# Patient Record
Sex: Female | Born: 1935 | Race: White | Hispanic: No | Marital: Married | Smoking: Never smoker
Health system: Southern US, Community
[De-identification: ages and names within clinical notes are randomized; demographics above are authoritative.]

## PROBLEM LIST (undated history)

## (undated) DIAGNOSIS — R8290 Unspecified abnormal findings in urine: Secondary | ICD-10-CM

## (undated) DIAGNOSIS — M545 Low back pain, unspecified: Secondary | ICD-10-CM

## (undated) DIAGNOSIS — J383 Other diseases of vocal cords: Secondary | ICD-10-CM

## (undated) DIAGNOSIS — M419 Scoliosis, unspecified: Secondary | ICD-10-CM

## (undated) DIAGNOSIS — R1013 Epigastric pain: Secondary | ICD-10-CM

## (undated) DIAGNOSIS — K5792 Diverticulitis of intestine, part unspecified, without perforation or abscess without bleeding: Secondary | ICD-10-CM

## (undated) DIAGNOSIS — E785 Hyperlipidemia, unspecified: Secondary | ICD-10-CM

## (undated) DIAGNOSIS — F5104 Psychophysiologic insomnia: Secondary | ICD-10-CM

## (undated) DIAGNOSIS — J101 Influenza due to other identified influenza virus with other respiratory manifestations: Secondary | ICD-10-CM

## (undated) DIAGNOSIS — F329 Major depressive disorder, single episode, unspecified: Secondary | ICD-10-CM

## (undated) DIAGNOSIS — M810 Age-related osteoporosis without current pathological fracture: Secondary | ICD-10-CM

## (undated) DIAGNOSIS — I639 Cerebral infarction, unspecified: Secondary | ICD-10-CM

## (undated) DIAGNOSIS — N183 Chronic kidney disease, stage 3 unspecified: Secondary | ICD-10-CM

## (undated) DIAGNOSIS — F331 Major depressive disorder, recurrent, moderate: Secondary | ICD-10-CM

## (undated) DIAGNOSIS — R740 Nonspecific elevation of levels of transaminase and lactic acid dehydrogenase [LDH]: Secondary | ICD-10-CM

## (undated) DIAGNOSIS — J9601 Acute respiratory failure with hypoxia: Secondary | ICD-10-CM

## (undated) DIAGNOSIS — D649 Anemia, unspecified: Secondary | ICD-10-CM

## (undated) DIAGNOSIS — G4700 Insomnia, unspecified: Secondary | ICD-10-CM

## (undated) DIAGNOSIS — M316 Other giant cell arteritis: Secondary | ICD-10-CM

## (undated) DIAGNOSIS — R238 Other skin changes: Secondary | ICD-10-CM

## (undated) DIAGNOSIS — A419 Sepsis, unspecified organism: Secondary | ICD-10-CM

## (undated) DIAGNOSIS — R32 Unspecified urinary incontinence: Secondary | ICD-10-CM

## (undated) DIAGNOSIS — Z8619 Personal history of other infectious and parasitic diseases: Secondary | ICD-10-CM

## (undated) DIAGNOSIS — F32A Depression, unspecified: Secondary | ICD-10-CM

## (undated) DIAGNOSIS — I2510 Atherosclerotic heart disease of native coronary artery without angina pectoris: Secondary | ICD-10-CM

## (undated) DIAGNOSIS — M069 Rheumatoid arthritis, unspecified: Secondary | ICD-10-CM

## (undated) DIAGNOSIS — IMO0002 Reserved for concepts with insufficient information to code with codable children: Secondary | ICD-10-CM

## (undated) DIAGNOSIS — K5790 Diverticulosis of intestine, part unspecified, without perforation or abscess without bleeding: Secondary | ICD-10-CM

## (undated) DIAGNOSIS — M48061 Spinal stenosis, lumbar region without neurogenic claudication: Secondary | ICD-10-CM

## (undated) DIAGNOSIS — M5127 Other intervertebral disc displacement, lumbosacral region: Secondary | ICD-10-CM

## (undated) DIAGNOSIS — G43909 Migraine, unspecified, not intractable, without status migrainosus: Secondary | ICD-10-CM

## (undated) DIAGNOSIS — I10 Essential (primary) hypertension: Secondary | ICD-10-CM

## (undated) DIAGNOSIS — R001 Bradycardia, unspecified: Secondary | ICD-10-CM

## (undated) DIAGNOSIS — K219 Gastro-esophageal reflux disease without esophagitis: Secondary | ICD-10-CM

## (undated) DIAGNOSIS — I480 Paroxysmal atrial fibrillation: Secondary | ICD-10-CM

## (undated) DIAGNOSIS — R109 Unspecified abdominal pain: Secondary | ICD-10-CM

## (undated) DIAGNOSIS — F411 Generalized anxiety disorder: Secondary | ICD-10-CM

## (undated) DIAGNOSIS — K649 Unspecified hemorrhoids: Secondary | ICD-10-CM

## (undated) DIAGNOSIS — G8929 Other chronic pain: Secondary | ICD-10-CM

## (undated) DIAGNOSIS — H269 Unspecified cataract: Secondary | ICD-10-CM

## (undated) DIAGNOSIS — T4145XA Adverse effect of unspecified anesthetic, initial encounter: Secondary | ICD-10-CM

## (undated) DIAGNOSIS — K529 Noninfective gastroenteritis and colitis, unspecified: Secondary | ICD-10-CM

## (undated) HISTORY — PX: OTHER SURGICAL HISTORY: SHX169

## (undated) HISTORY — DX: Cerebral infarction, unspecified: I63.9

## (undated) HISTORY — PX: X-STOP IMPLANTATION: SHX2677

## (undated) HISTORY — PX: BACK SURGERY: SHX140

## (undated) HISTORY — PX: EPIDURAL BLOCK INJECTION: SHX1516

## (undated) HISTORY — DX: Unspecified urinary incontinence: R32

## (undated) HISTORY — PX: BREAST BIOPSY: SHX20

## (undated) HISTORY — DX: Acute respiratory failure with hypoxia: J96.01

## (undated) HISTORY — PX: CATARACT EXTRACTION W/ INTRAOCULAR LENS IMPLANT: SHX1309

## (undated) HISTORY — DX: Anemia, unspecified: D64.9

## (undated) HISTORY — PX: TUBAL LIGATION: SHX77

## (undated) HISTORY — DX: Unspecified abnormal findings in urine: R82.90

## (undated) HISTORY — DX: Hyperlipidemia, unspecified: E78.5

## (undated) HISTORY — DX: Unspecified hemorrhoids: K64.9

## (undated) HISTORY — DX: Atherosclerotic heart disease of native coronary artery without angina pectoris: I25.10

## (undated) HISTORY — PX: TONSILLECTOMY AND ADENOIDECTOMY: SUR1326

## (undated) HISTORY — DX: Epigastric pain: R10.13

## (undated) HISTORY — DX: Nonspecific elevation of levels of transaminase and lactic acid dehydrogenase (ldh): R74.0

## (undated) HISTORY — DX: Other giant cell arteritis: M31.6

## (undated) HISTORY — DX: Diverticulosis of intestine, part unspecified, without perforation or abscess without bleeding: K57.90

## (undated) HISTORY — DX: Unspecified abdominal pain: R10.9

## (undated) HISTORY — DX: Psychophysiologic insomnia: F51.04

## (undated) HISTORY — DX: Gastro-esophageal reflux disease without esophagitis: K21.9

## (undated) HISTORY — DX: Major depressive disorder, recurrent, moderate: F33.1

## (undated) HISTORY — DX: Influenza due to other identified influenza virus with other respiratory manifestations: J10.1

## (undated) HISTORY — DX: Essential (primary) hypertension: I10

## (undated) HISTORY — PX: ABDOMINAL HYSTERECTOMY: SHX81

## (undated) HISTORY — DX: Other intervertebral disc displacement, lumbosacral region: M51.27

## (undated) HISTORY — DX: Spinal stenosis, lumbar region without neurogenic claudication: M48.061

## (undated) HISTORY — DX: Noninfective gastroenteritis and colitis, unspecified: K52.9

## (undated) HISTORY — DX: Sepsis, unspecified organism: A41.9

---

## 1999-03-31 HISTORY — PX: LAPAROSCOPIC CHOLECYSTECTOMY: SUR755

## 1999-03-31 HISTORY — PX: BLADDER SUSPENSION: SHX72

## 2000-04-02 LAB — HM PAP SMEAR

## 2004-03-30 HISTORY — PX: CORONARY ANGIOPLASTY WITH STENT PLACEMENT: SHX49

## 2007-04-03 LAB — HM COLONOSCOPY

## 2009-03-30 DIAGNOSIS — M316 Other giant cell arteritis: Secondary | ICD-10-CM

## 2009-03-30 DIAGNOSIS — I480 Paroxysmal atrial fibrillation: Secondary | ICD-10-CM

## 2009-03-30 HISTORY — PX: TEMPORAL ARTERY BIOPSY / LIGATION: SUR132

## 2009-03-30 HISTORY — DX: Other giant cell arteritis: M31.6

## 2009-03-30 HISTORY — DX: Paroxysmal atrial fibrillation: I48.0

## 2009-04-02 LAB — HM MAMMOGRAPHY

## 2010-03-30 HISTORY — PX: HEMIARTHROPLASTY HIP: SUR652

## 2010-09-01 ENCOUNTER — Encounter: Payer: Self-pay | Admitting: Family Medicine

## 2010-09-01 ENCOUNTER — Ambulatory Visit (INDEPENDENT_AMBULATORY_CARE_PROVIDER_SITE_OTHER): Payer: Medicare Other | Admitting: Family Medicine

## 2010-09-01 VITALS — BP 150/80 | HR 72 | Temp 98.3°F | Resp 12 | Ht 64.75 in | Wt 152.0 lb

## 2010-09-01 DIAGNOSIS — I2510 Atherosclerotic heart disease of native coronary artery without angina pectoris: Secondary | ICD-10-CM

## 2010-09-01 DIAGNOSIS — G4700 Insomnia, unspecified: Secondary | ICD-10-CM

## 2010-09-01 DIAGNOSIS — M316 Other giant cell arteritis: Secondary | ICD-10-CM | POA: Insufficient documentation

## 2010-09-01 DIAGNOSIS — I10 Essential (primary) hypertension: Secondary | ICD-10-CM | POA: Insufficient documentation

## 2010-09-01 DIAGNOSIS — K219 Gastro-esophageal reflux disease without esophagitis: Secondary | ICD-10-CM

## 2010-09-01 DIAGNOSIS — E785 Hyperlipidemia, unspecified: Secondary | ICD-10-CM

## 2010-09-01 DIAGNOSIS — Z8679 Personal history of other diseases of the circulatory system: Secondary | ICD-10-CM

## 2010-09-01 HISTORY — DX: Gastro-esophageal reflux disease without esophagitis: K21.9

## 2010-09-01 NOTE — Progress Notes (Signed)
  Subjective:    Patient ID: Betty Jackson, female    DOB: April 11, 1934, 76 y.o.   MRN: [id number]  HPI Patient is new to establish care. Past medical history is reviewed. She has temporal arteritis diagnosed March 2011. Currently followed at [facility] Recent reduction prednisone 10 mg daily. Had recent sedimentation rate and C-reactive protein and needs these monthly. Plan is to continue slow taper of prednisone. Other medical problems include history of CAD with reported heart stent 2006. Questionable right coronary artery. She's had no similar problems since then. She has not established with cardiologist here yet.  Other problems include history of hypertension, hyperlipidemia, transient atrial fibrillation. She has not had any history of diabetes but monitors blood sugars because of prednisone use. Blood sugars consistently in the 90s.  History of cholecystectomy, tonsillectomy, and total hysterectomy age 72. This was secondary to menorrhagia.  Daughter with breast cancer. Premature heart disease in parents as well as hypertension history  Patient is married. No history of smoking. Occasional alcohol use.   Review of Systems  Constitutional: Negative for fever, activity change, appetite change and unexpected weight change.  Respiratory: Negative for cough, shortness of breath and wheezing.   Cardiovascular: Negative for chest pain, palpitations and leg swelling.  Gastrointestinal: Negative for abdominal pain.  Genitourinary: Negative for dysuria.  Skin: Negative for rash.  Neurological: Negative for dizziness, seizures, syncope, weakness and headaches.  Hematological: Negative for adenopathy. Does not bruise/bleed easily.  Psychiatric/Behavioral: Negative for dysphoric mood.       Objective:   Physical Exam  Constitutional: She is oriented to person, place, and time. She appears well-developed and well-nourished.  HENT:  Head: Normocephalic and atraumatic.  Right Ear: External  ear normal.  Left Ear: External ear normal.  Neck: Neck supple. No thyromegaly present.  Cardiovascular: Normal rate, regular rhythm and normal heart sounds.   No murmur heard. Pulmonary/Chest: Effort normal and breath sounds normal. No respiratory distress. She has no wheezes. She has no rales.  Musculoskeletal: Normal range of motion. She exhibits no edema.  Lymphadenopathy:    She has no cervical adenopathy.  Neurological: She is alert and oriented to person, place, and time. No cranial nerve deficit.  Skin: No rash noted.  Psychiatric: She has a normal mood and affect. Her behavior is normal.          Assessment & Plan:  #1 history of temporal arteritis. Followed at [facility]. Currently prednisone 10 mg daily. She'll present here monthly for sedimentation rate and C-reactive protein #2 hypertension- stable by home readings #3 hyperlipidemia #4 history of CAD with questionable stent right coronary artery #5 history of reported transient atrial fibrillation  Obtain old records. Schedule followup in 2 months. Labs in one month

## 2010-09-17 ENCOUNTER — Encounter: Payer: Self-pay | Admitting: Family Medicine

## 2010-09-17 ENCOUNTER — Ambulatory Visit (INDEPENDENT_AMBULATORY_CARE_PROVIDER_SITE_OTHER): Payer: Medicare Other | Admitting: Family Medicine

## 2010-09-17 VITALS — BP 154/86 | HR 80 | Temp 98.6°F | Wt 146.0 lb

## 2010-09-17 DIAGNOSIS — I10 Essential (primary) hypertension: Secondary | ICD-10-CM

## 2010-09-17 DIAGNOSIS — R42 Dizziness and giddiness: Secondary | ICD-10-CM

## 2010-09-17 MED ORDER — METOPROLOL TARTRATE 50 MG PO TABS: 50.0000 mg | ORAL_TABLET | Freq: Two times a day (BID) | ORAL | Status: DC

## 2010-09-17 NOTE — Progress Notes (Signed)
  Subjective:    Patient ID: Betty Jackson, female    DOB: 1935/03/28, 76 y.o.   MRN: [id number]  HPI Here with her daughter for several days of elevated BPs. This past weekend she has several days of mild HAs, dizziness, and generalized fatigue. Her BP jumped up to 180s systolic. No chest pains or SOB. Now today the BP is back down and she feels better. She cannot think of a reason to explain why her BP went up. No recent medication changes, although her temporal arteritis specialist at [facility] recently decreased her daily dose of prednisone from 15 mg to 10 mg. No swelling in the hands or feet.    Review of Systems  Constitutional: Positive for fatigue.  HENT: Negative.   Respiratory: Negative.   Cardiovascular: Negative.   Gastrointestinal: Negative.        Objective:   Physical Exam  Constitutional: She is oriented to person, place, and time. She appears well-developed and well-nourished.  HENT:  Head: Normocephalic and atraumatic.  Eyes: Conjunctivae are normal. Pupils are equal, round, and reactive to light.  Neck: Normal range of motion. Neck supple. No thyromegaly present.  Cardiovascular: Normal rate, regular rhythm, normal heart sounds and intact distal pulses.  Exam reveals no gallop and no friction rub.   No murmur heard. Pulmonary/Chest: Effort normal and breath sounds normal. No respiratory distress. She has no wheezes. She has no rales. She exhibits no tenderness.  Lymphadenopathy:    She has no cervical adenopathy.  Neurological: She is alert and oriented to person, place, and time. She exhibits normal muscle tone. Coordination normal.          Assessment & Plan:  I think her recent symptoms of fatigue and dizziness were related to the increased BP, and now that her BP is back down she feels better. I am not sure why this recent increase occurred, but we want to make sure it doesn't happen again. Increase the Metoprolol to 50 mg bid. See Dr. Caryl Never again in 2-3 weeks

## 2010-09-25 ENCOUNTER — Other Ambulatory Visit (INDEPENDENT_AMBULATORY_CARE_PROVIDER_SITE_OTHER): Payer: Medicare Other

## 2010-09-25 ENCOUNTER — Telehealth: Payer: Self-pay | Admitting: Family Medicine

## 2010-09-25 DIAGNOSIS — M316 Other giant cell arteritis: Secondary | ICD-10-CM

## 2010-09-25 NOTE — Telephone Encounter (Signed)
PT needs this faxed to Dr. Sigurd Sos at [facility]. Fax number is [phone].

## 2010-09-25 NOTE — Telephone Encounter (Signed)
Patient would like current copy of blood work sent over to Dr. Quita Skye office.  She needs to have her prednisone refilled but Dr. Clovis Riley needs to view blood results first.  Thank you

## 2010-09-25 NOTE — Telephone Encounter (Signed)
Labs faxed electronically to Shanon Payor MD

## 2010-09-26 NOTE — Progress Notes (Signed)
Quick Note:  Labs faxed to Sigurd Sos, MD at [facility] at [phone] ______

## 2010-09-26 NOTE — Progress Notes (Signed)
Quick Note:  Pt informed also ______

## 2010-09-26 NOTE — Progress Notes (Signed)
Quick Note:  Pt informed, labs faxed to Sigurd Sos [phone] ______

## 2010-10-06 ENCOUNTER — Encounter: Payer: Self-pay | Admitting: Family Medicine

## 2010-10-06 ENCOUNTER — Ambulatory Visit (INDEPENDENT_AMBULATORY_CARE_PROVIDER_SITE_OTHER): Payer: Medicare Other | Admitting: Family Medicine

## 2010-10-06 DIAGNOSIS — I10 Essential (primary) hypertension: Secondary | ICD-10-CM

## 2010-10-06 DIAGNOSIS — M316 Other giant cell arteritis: Secondary | ICD-10-CM

## 2010-10-06 MED ORDER — AMLODIPINE BESYLATE 5 MG PO TABS: 5.0000 mg | ORAL_TABLET | Freq: Every day | ORAL | Status: DC

## 2010-10-06 NOTE — Progress Notes (Signed)
  Subjective:    Patient ID: Betty Jackson, female    DOB: 01/05/1935, 76 y.o.   MRN: [id number]  HPI Followup hypertension. Refer to last note. She had elevated blood pressure and Lopressor increased to 50 mg twice daily. She does have history of CAD. She also takes losartan HCTZ 50/12.5 one daily. She's had several recent blood pressures in the 160 systolic range. This is down from high of 200 systolic prior to increasing the Lopressor. She's had some fatigue and headache issues which are slightly better.  History temporal arteritis. Followed at [facility]. Recent reduction in prednisone 9 mg daily. Repeat sedimentation rate 2 in one month.  Past Medical History  Diagnosis Date  . Arthritis   . Headache   . Hypertension   . Hyperlipidemia   . Heart disease   . Incontinence of urine   . GERD (gastroesophageal reflux disease)   . Temporal arteritis 2011   Past Surgical History  Procedure Date  . Cholecystectomy   . Tonsillectomy and adenoidectomy   . Carotid stent 2006  . Abdominal hysterectomy age 59    TAH    reports that she has never smoked. She does not have any smokeless tobacco history on file. Her alcohol and drug histories not on file. family history includes Cancer in her daughter; Heart disease in her other; and Hypertension in her other.  There is no history of Arthritis. No Known Allergies    Review of Systems  Constitutional: Negative for fatigue.  Eyes: Negative for visual disturbance.  Respiratory: Negative for cough, chest tightness, shortness of breath and wheezing.   Cardiovascular: Negative for chest pain, palpitations and leg swelling.  Neurological: Negative for dizziness, seizures, syncope, weakness, light-headedness and headaches.       Objective:   Physical Exam  Constitutional: She appears well-developed and well-nourished.  HENT:  Mouth/Throat: Oropharynx is clear and moist.  Neck: Normal range of motion. Neck supple. No thyromegaly present.    Cardiovascular: Normal rate and regular rhythm.   Pulmonary/Chest: Effort normal and breath sounds normal. No respiratory distress. She has no wheezes. She has no rales.  Musculoskeletal: She exhibits no edema.  Lymphadenopathy:    She has no cervical adenopathy.          Assessment & Plan:  Hypertension. Suboptimally controlled. Add amlodipine 5 mg daily to her current regimen and reassess one month. Reviewed possible side effects.

## 2010-10-21 ENCOUNTER — Other Ambulatory Visit: Payer: Self-pay | Admitting: Psychiatry

## 2010-10-21 ENCOUNTER — Other Ambulatory Visit: Payer: Self-pay | Admitting: *Deleted

## 2010-10-21 DIAGNOSIS — M316 Other giant cell arteritis: Secondary | ICD-10-CM

## 2010-10-21 DIAGNOSIS — I6350 Cerebral infarction due to unspecified occlusion or stenosis of unspecified cerebral artery: Secondary | ICD-10-CM

## 2010-10-23 ENCOUNTER — Telehealth: Payer: Self-pay | Admitting: Family Medicine

## 2010-10-23 MED ORDER — TEMAZEPAM 30 MG PO CAPS: 30.0000 mg | ORAL_CAPSULE | Freq: Every evening | ORAL | Status: DC | PRN

## 2010-10-23 NOTE — Telephone Encounter (Signed)
Rx called in 

## 2010-10-23 NOTE — Telephone Encounter (Signed)
Pt is requesting a new rx for Temazepam 30 1qhs. Please call [facility] --[phone].

## 2010-11-03 ENCOUNTER — Ambulatory Visit
Admission: RE | Admit: 2010-11-03 | Discharge: 2010-11-03 | Disposition: A | Discharge status: Home / Self Care | Payer: Medicare Other | Source: Ambulatory Visit | Attending: *Deleted | Admitting: *Deleted

## 2010-11-03 DIAGNOSIS — I6350 Cerebral infarction due to unspecified occlusion or stenosis of unspecified cerebral artery: Secondary | ICD-10-CM

## 2010-11-03 DIAGNOSIS — M316 Other giant cell arteritis: Secondary | ICD-10-CM

## 2010-11-04 ENCOUNTER — Ambulatory Visit: Payer: PRIVATE HEALTH INSURANCE | Admitting: Family Medicine

## 2010-11-05 ENCOUNTER — Encounter: Payer: Self-pay | Admitting: Family Medicine

## 2010-11-05 ENCOUNTER — Ambulatory Visit (INDEPENDENT_AMBULATORY_CARE_PROVIDER_SITE_OTHER): Payer: Medicare Other | Admitting: Family Medicine

## 2010-11-05 VITALS — BP 140/78 | Temp 97.8°F | Wt 148.0 lb

## 2010-11-05 DIAGNOSIS — M316 Other giant cell arteritis: Secondary | ICD-10-CM

## 2010-11-05 DIAGNOSIS — I10 Essential (primary) hypertension: Secondary | ICD-10-CM

## 2010-11-05 LAB — SEDIMENTATION RATE: Sed Rate: 15 mm/hr (ref 0–22)

## 2010-11-05 NOTE — Progress Notes (Signed)
  Subjective:    Patient ID: Betty Jackson, female    DOB: 1935/01/07, 76 y.o.   MRN: [id number]  HPI Followup hypertension. Add amlodipine 5 mg daily last visit. Feels much better overall. More energy. No headaches. Denies dizziness, edema, or headache. Blood pressure improved by home readings. He had been running around 160 systolic and occasionally higher. Compliant with all medications.  Temporal arteritis followed at [facility]. Currently prednisone 9 mg daily. Needs repeat sedimentation rate and CRP today.   Review of Systems  Constitutional: Negative for fatigue.  Eyes: Negative for visual disturbance.  Respiratory: Negative for cough, chest tightness, shortness of breath and wheezing.   Cardiovascular: Negative for chest pain, palpitations and leg swelling.  Neurological: Negative for dizziness, seizures, syncope, weakness, light-headedness and headaches.       Objective:   Physical Exam  Constitutional: She appears well-developed and well-nourished.  HENT:  Mouth/Throat: Oropharynx is clear and moist.  Neck: Neck supple. No thyromegaly present.  Cardiovascular: Normal rate, regular rhythm and normal heart sounds.   Pulmonary/Chest: Effort normal and breath sounds normal. No respiratory distress. She has no wheezes. She has no rales.  Musculoskeletal: She exhibits no edema.          Assessment & Plan:  #1 hypertension improved continue current medications #2 temporal arteritis. Recheck sedimentation rate and CRP today and fax copies to [facility]

## 2010-11-06 NOTE — Progress Notes (Signed)
Quick Note:  Pt informed, labs faxed to Sigurd Sos, MD, [phone] ______

## 2010-11-25 ENCOUNTER — Other Ambulatory Visit: Payer: Self-pay | Admitting: Family Medicine

## 2010-11-25 MED ORDER — OMEPRAZOLE 20 MG PO CPDR: 20.0000 mg | DELAYED_RELEASE_CAPSULE | Freq: Every day | ORAL | Status: DC

## 2010-11-25 NOTE — Telephone Encounter (Signed)
done

## 2010-11-25 NOTE — Telephone Encounter (Signed)
Pt requesting refill on omeprazole (PRILOSEC) 10 MG capsule (pt said should be 20mg )  Send to [facility]

## 2010-12-03 ENCOUNTER — Other Ambulatory Visit (INDEPENDENT_AMBULATORY_CARE_PROVIDER_SITE_OTHER): Payer: Medicare Other

## 2010-12-03 DIAGNOSIS — M316 Other giant cell arteritis: Secondary | ICD-10-CM

## 2010-12-03 LAB — SEDIMENTATION RATE: Sed Rate: 10 mm/hr (ref 0–22)

## 2010-12-05 ENCOUNTER — Telehealth: Payer: Self-pay | Admitting: Family Medicine

## 2010-12-05 NOTE — Telephone Encounter (Signed)
Pt requesting results of  Labs be sent to DR [facility]. Please contact when this is complete

## 2010-12-08 NOTE — Telephone Encounter (Signed)
Labs faxed to Sigurd Sos MD

## 2010-12-09 NOTE — Progress Notes (Signed)
Quick Note:  Labs faxed to [place] ______

## 2011-01-02 ENCOUNTER — Other Ambulatory Visit: Payer: Medicare Other

## 2011-01-05 ENCOUNTER — Telehealth: Payer: Self-pay | Admitting: Family Medicine

## 2011-01-05 DIAGNOSIS — I4891 Unspecified atrial fibrillation: Secondary | ICD-10-CM

## 2011-01-05 NOTE — Telephone Encounter (Signed)
Please advise 

## 2011-01-05 NOTE — Telephone Encounter (Signed)
CRP and ESR per [facility].  Need to confirm duration of these tests.  We have been sending to [facility].

## 2011-01-05 NOTE — Telephone Encounter (Signed)
Message left on home VM requesting duration of these tests.  Will order when we hear back CRP and ESR

## 2011-01-05 NOTE — Telephone Encounter (Signed)
Pt said she is supposed to get lab work done every 28 days. Did not see an order in the system. What labs does she needs to be scheduled for?

## 2011-01-06 ENCOUNTER — Other Ambulatory Visit (INDEPENDENT_AMBULATORY_CARE_PROVIDER_SITE_OTHER): Payer: Medicare Other

## 2011-01-06 DIAGNOSIS — I4891 Unspecified atrial fibrillation: Secondary | ICD-10-CM

## 2011-01-06 DIAGNOSIS — Z23 Encounter for immunization: Secondary | ICD-10-CM

## 2011-01-06 LAB — SEDIMENTATION RATE: Sed Rate: 13 mm/hr (ref 0–22)

## 2011-01-06 NOTE — Telephone Encounter (Signed)
Pt called back, [facility] physician will need these labs for awhile yet.  Will schedule standing orders

## 2011-01-07 NOTE — Progress Notes (Signed)
Quick Note:  Pt informed, labs faxed to Sigurd Sos, MD at [place], [phone] ______

## 2011-01-22 ENCOUNTER — Telehealth: Payer: Self-pay | Admitting: Family Medicine

## 2011-01-23 ENCOUNTER — Telehealth: Payer: Self-pay | Admitting: *Deleted

## 2011-01-23 DIAGNOSIS — I2510 Atherosclerotic heart disease of native coronary artery without angina pectoris: Secondary | ICD-10-CM

## 2011-01-23 NOTE — Telephone Encounter (Signed)
Great.  Will set up.  Let her know we will make her referral.

## 2011-01-23 NOTE — Telephone Encounter (Signed)
VM from pt requesting a cardiology referral to Dr Clifton James.  That is who her husband sees

## 2011-01-23 NOTE — Telephone Encounter (Signed)
For what indication?

## 2011-01-23 NOTE — Telephone Encounter (Signed)
I called pt, she reports she had a Cardiologist in [place], [place], "I have a stent and they would check me out good annually".

## 2011-01-26 NOTE — Telephone Encounter (Signed)
Pt informed on home VM 

## 2011-02-03 ENCOUNTER — Other Ambulatory Visit (INDEPENDENT_AMBULATORY_CARE_PROVIDER_SITE_OTHER): Payer: Medicare Other

## 2011-02-03 DIAGNOSIS — I4891 Unspecified atrial fibrillation: Secondary | ICD-10-CM

## 2011-02-03 LAB — SEDIMENTATION RATE: Sed Rate: 12 mm/hr (ref 0–22)

## 2011-02-04 DIAGNOSIS — M069 Rheumatoid arthritis, unspecified: Secondary | ICD-10-CM | POA: Insufficient documentation

## 2011-02-04 LAB — C-REACTIVE PROTEIN: CRP: 0.24 mg/dL (ref <0.60)

## 2011-02-04 NOTE — Progress Notes (Signed)
Quick Note:  Pt informed ______ 

## 2011-02-04 NOTE — Progress Notes (Signed)
Quick Note:  Pt informed, labs faxed to Sigurd Sos, MD at [facility] ______

## 2011-02-18 ENCOUNTER — Ambulatory Visit (INDEPENDENT_AMBULATORY_CARE_PROVIDER_SITE_OTHER): Payer: Medicare Other | Admitting: Cardiovascular Disease

## 2011-02-18 ENCOUNTER — Encounter: Payer: Self-pay | Admitting: Cardiovascular Disease

## 2011-02-18 VITALS — BP 110/74 | HR 60 | Ht 65.0 in | Wt 147.0 lb

## 2011-02-18 DIAGNOSIS — M79604 Pain in right leg: Secondary | ICD-10-CM | POA: Insufficient documentation

## 2011-02-18 DIAGNOSIS — M79609 Pain in unspecified limb: Secondary | ICD-10-CM

## 2011-02-18 DIAGNOSIS — I2510 Atherosclerotic heart disease of native coronary artery without angina pectoris: Secondary | ICD-10-CM

## 2011-02-18 NOTE — Assessment & Plan Note (Signed)
Her right leg pain does not sound vascular however i was unable to find a right DP pulse with the hand held doppler. Her right PT and left DP/PT pulses are normal. Will get bilateral lower ext arterial dopplers to assess.

## 2011-02-18 NOTE — Progress Notes (Signed)
History of Present Illness:76 yo female with history of CAD s/p stent RCA, HTN, HLD, GERD, temporal arteritis here today to establish cardiac care. Her husband is my patient. She tells me that she had a stent placed in the RCA in [place], [place] in 2005. She has had occasional chest pains since then but none lately. She had repeat cardiac cath 3 years ago in [place] that showed patency of her stent and no other severe disease per the patient. She moved to [facility] for treatment of her temporal arteritis at [facility]. She has one isolated episode atrial fibrillation last year. No recurrence.   She tells me that she feels well. She has had no chest pain or SOB. Overall feels well. She does describe some pain in her right leg. This pain is across the anterior aspect of the right leg. This is at rest. It hurts to touch it. She was seen in orthopedics and told it may be from her knee. No swelling in her ankles or the right leg. No claudication.  Past Medical History  Diagnosis Date  . Arthritis   . Headache   . Hypertension   . Hyperlipidemia   . Incontinence of urine   . GERD (gastroesophageal reflux disease)   . Temporal arteritis 2011  . CAD (coronary artery disease)     Stent RCA in [facility]    Past Surgical History  Procedure Date  . Cholecystectomy   . Tonsillectomy and adenoidectomy   . Abdominal hysterectomy age 69    TAH  . Bladder suspension     Current Outpatient Prescriptions  Medication Sig Dispense Refill  . ALPRAZolam (XANAX) 0.5 MG tablet Take 0.5 mg by mouth at bedtime as needed.        Marland Kitchen amLODipine (NORVASC) 5 MG tablet Take 1 tablet (5 mg total) by mouth daily.  30 tablet  5  . aspirin 81 MG tablet Take 81 mg by mouth 2 (two) times daily.       Marland Kitchen atorvastatin (LIPITOR) 20 MG tablet Take 20 mg by mouth daily.        Marland Kitchen HYDROcodone-acetaminophen (NORCO) 10-325 MG per tablet Take 1 tablet by mouth every 6 (six) hours as needed.        Marland Kitchen losartan-hydrochlorothiazide (HYZAAR)  50-12.5 MG per tablet Take 1 tablet by mouth daily.        . metoprolol (LOPRESSOR) 50 MG tablet Take 1 tablet (50 mg total) by mouth 2 (two) times daily.  60 tablet  5  . omeprazole (PRILOSEC) 20 MG capsule Take 1 capsule (20 mg total) by mouth daily.  90 capsule  1  . Potassium Gluconate 550 MG TABS Take by mouth daily.        . predniSONE (DELTASONE) 1 MG tablet 4 tabs daily in the am along with a 5 mg tab       . predniSONE (DELTASONE) 5 MG tablet Take 5 mg by mouth daily.       . temazepam (RESTORIL) 30 MG capsule Take 1 capsule (30 mg total) by mouth at bedtime as needed.  30 capsule  1    No Known Allergies  History   Social History  . Marital Status: Married    Spouse Name: N/A    Number of Children: N/A  . Years of Education: N/A   Occupational History  . Not on file.   Social History Main Topics  . Smoking status: Never Smoker   . Smokeless tobacco: Not on file  .  Alcohol Use: Not on file  . Drug Use: Not on file  . Sexually Active: Not on file   Other Topics Concern  . Not on file   Social History Narrative  . No narrative on file    Family History  Problem Relation Age of Onset  . Cancer Daughter     breast  . Arthritis Neg Hx   . Heart disease Other   . Hypertension Other     Review of Systems:  As stated in the HPI and otherwise negative.   BP 110/74  Pulse 60  Ht 5\' 5"  (1.651 m)  Wt 147 lb (66.679 kg)  BMI 24.46 kg/m2  Physical Examination: General: Well developed, well nourished, NAD HEENT: OP clear, mucus membranes moist SKIN: warm, dry. No rashes. Neuro: No focal deficits Musculoskeletal: Muscle strength 5/5 all ext Psychiatric: Mood and affect normal Neck: No JVD, no carotid bruits, no thyromegaly, no lymphadenopathy. Lungs:Clear bilaterally, no wheezes, rhonci, crackles Cardiovascular: Regular rate and rhythm. No murmurs, gallops or rubs. Abdomen:Soft. Bowel sounds present. Non-tender.  Extremities: No lower extremity edema. Pulses  are 2 + in the left  DP/PT and the right PT. Could not find right DP with doppler.   YQM:VHQIO bradycardia, rate 50 bpm.

## 2011-02-18 NOTE — Assessment & Plan Note (Addendum)
Stable. She is on good therapy. No angina. No changes.

## 2011-02-18 NOTE — Patient Instructions (Signed)
Your physician wants you to follow-up in 12 months.  You will receive a reminder letter in the mail two months in advance. If you don't receive a letter, please call our office to schedule the follow-up appointment.  Your physician has requested that you have a lower  extremity arterial duplex. This test is an ultrasound of the arteries in the legs or arms. It looks at arterial blood flow in the legs and arms. Allow one hour for Lower and Upper Arterial scans. There are no restrictions or special instructions

## 2011-03-04 ENCOUNTER — Ambulatory Visit: Payer: Medicare Other

## 2011-03-04 DIAGNOSIS — Z0000 Encounter for general adult medical examination without abnormal findings: Secondary | ICD-10-CM

## 2011-03-04 DIAGNOSIS — M316 Other giant cell arteritis: Secondary | ICD-10-CM

## 2011-03-04 DIAGNOSIS — I7101 Dissection of thoracic aorta: Secondary | ICD-10-CM

## 2011-03-04 DIAGNOSIS — R799 Abnormal finding of blood chemistry, unspecified: Secondary | ICD-10-CM

## 2011-03-04 DIAGNOSIS — I4891 Unspecified atrial fibrillation: Secondary | ICD-10-CM

## 2011-03-04 DIAGNOSIS — M729 Fibroblastic disorder, unspecified: Secondary | ICD-10-CM

## 2011-03-04 DIAGNOSIS — I25119 Atherosclerotic heart disease of native coronary artery with unspecified angina pectoris: Secondary | ICD-10-CM

## 2011-03-04 DIAGNOSIS — I2510 Atherosclerotic heart disease of native coronary artery without angina pectoris: Secondary | ICD-10-CM

## 2011-03-04 LAB — SEDIMENTATION RATE: Sed Rate: 20 mm/hr (ref 0–22)

## 2011-03-06 ENCOUNTER — Encounter: Payer: Self-pay | Admitting: Family Medicine

## 2011-03-06 ENCOUNTER — Ambulatory Visit (INDEPENDENT_AMBULATORY_CARE_PROVIDER_SITE_OTHER): Payer: Medicare Other | Admitting: Family Medicine

## 2011-03-06 ENCOUNTER — Encounter: Payer: Medicare Other | Admitting: Cardiology

## 2011-03-06 DIAGNOSIS — I10 Essential (primary) hypertension: Secondary | ICD-10-CM

## 2011-03-06 DIAGNOSIS — E785 Hyperlipidemia, unspecified: Secondary | ICD-10-CM

## 2011-03-06 DIAGNOSIS — G4700 Insomnia, unspecified: Secondary | ICD-10-CM

## 2011-03-06 LAB — BASIC METABOLIC PANEL
BUN: 15 mg/dL (ref 6–23)
CO2: 31 mEq/L (ref 19–32)
Glucose, Bld: 75 mg/dL (ref 70–99)
Potassium: 3.6 mEq/L (ref 3.5–5.1)
Sodium: 139 mEq/L (ref 135–145)

## 2011-03-06 LAB — LIPID PANEL
HDL: 58 mg/dL (ref >39.00)
Total CHOL/HDL Ratio: 2
VLDL: 12.6 mg/dL (ref 0.0–40.0)

## 2011-03-06 LAB — HEPATIC FUNCTION PANEL
Bilirubin, Direct: 0.1 mg/dL (ref 0.0–0.3)
Total Bilirubin: 0.9 mg/dL (ref 0.3–1.2)

## 2011-03-06 NOTE — Progress Notes (Signed)
  Subjective:    Patient ID: Betty Jackson, female    DOB: 07/31/1934, 76 y.o.   MRN: [id number]  HPI  Medical followup. Patient has history of temporal arteritis, hypertension, hyperlipidemia, history of atrial fibrillation, CAD and some chronic intermittent insomnia. Recently saw cardiologist. Concern for possible circulation issues right lower extremity. Scheduled for arterial Doppler later today. Medications reviewed. Compliant with all. She has not had lipid panel since moving here from [place]. Takes Lipitor 20 mg daily. No consistent myalgias.  Blood pressure well controlled. No recent orthostasis. Denies any recent chest pains. Nonsmoker. Rare alcohol use.  Past Medical History  Diagnosis Date  . Arthritis   . Headache   . Hypertension   . Hyperlipidemia   . Incontinence of urine   . GERD (gastroesophageal reflux disease)   . Temporal arteritis 2011  . CAD (coronary artery disease)     Stent RCA in [facility]   Past Surgical History  Procedure Date  . Cholecystectomy   . Tonsillectomy and adenoidectomy   . Abdominal hysterectomy age 60    TAH  . Bladder suspension     reports that she has never smoked. She does not have any smokeless tobacco history on file. She reports that she drinks about one ounce of alcohol per week. She reports that she does not use illicit drugs. family history includes Cancer in her daughter and mother; Heart attack in her father; Heart disease in her other; and Hypertension in her other.  There is no history of Arthritis. No Known Allergies    Review of Systems  Constitutional: Negative for fatigue and unexpected weight change.  Eyes: Negative for visual disturbance.  Respiratory: Negative for cough, chest tightness, shortness of breath and wheezing.   Cardiovascular: Negative for chest pain, palpitations and leg swelling.  Gastrointestinal: Negative for abdominal pain.  Genitourinary: Negative for dysuria.  Skin: Negative for rash.    Neurological: Negative for dizziness, seizures, syncope, weakness, light-headedness and headaches.  Psychiatric/Behavioral: Negative for dysphoric mood.       Objective:   Physical Exam  Constitutional: She is oriented to person, place, and time. She appears well-developed and well-nourished.  Eyes: Pupils are equal, round, and reactive to light.  Neck: Neck supple. No thyromegaly present.  Cardiovascular: Normal rate, regular rhythm and normal heart sounds.   Pulmonary/Chest: Breath sounds normal. No respiratory distress. She has no wheezes. She has no rales.  Musculoskeletal: She exhibits no edema.  Neurological: She is alert and oriented to person, place, and time.          Assessment & Plan:  #1 hypertension stable continue current medications #2 hyperlipidemia with history of CAD. Check lipid and hepatic panel. #3 intermittent insomnia. Sleep hygiene discussed. Patient is cautioned not to use temazepam and alprazolam concomitantly

## 2011-03-09 ENCOUNTER — Encounter (INDEPENDENT_AMBULATORY_CARE_PROVIDER_SITE_OTHER): Payer: Medicare Other | Admitting: *Deleted

## 2011-03-09 DIAGNOSIS — I739 Peripheral vascular disease, unspecified: Secondary | ICD-10-CM

## 2011-03-09 DIAGNOSIS — I70219 Atherosclerosis of native arteries of extremities with intermittent claudication, unspecified extremity: Secondary | ICD-10-CM

## 2011-03-10 NOTE — Progress Notes (Signed)
Quick Note:  Pt informed, labs faxed to Sigurd Sos, [phone] ______

## 2011-03-10 NOTE — Progress Notes (Signed)
Quick Note:  Pt informed, labs faxed to [facility] ______

## 2011-03-19 ENCOUNTER — Encounter: Payer: Self-pay | Admitting: *Deleted

## 2011-04-01 ENCOUNTER — Telehealth: Payer: Self-pay | Admitting: Cardiovascular Disease

## 2011-04-01 NOTE — Telephone Encounter (Signed)
New msg Pt received a letter to call back about test results

## 2011-04-02 ENCOUNTER — Ambulatory Visit (INDEPENDENT_AMBULATORY_CARE_PROVIDER_SITE_OTHER): Payer: Medicare Other | Admitting: Family Medicine

## 2011-04-02 DIAGNOSIS — C44320 Squamous cell carcinoma of skin of unspecified parts of face: Secondary | ICD-10-CM | POA: Diagnosis not present

## 2011-04-02 DIAGNOSIS — I4891 Unspecified atrial fibrillation: Secondary | ICD-10-CM | POA: Diagnosis not present

## 2011-04-02 LAB — SEDIMENTATION RATE: Sed Rate: 21 mm/hr (ref 0–22)

## 2011-04-02 NOTE — Telephone Encounter (Signed)
Pt was notified.  

## 2011-04-03 LAB — C-REACTIVE PROTEIN: CRP: 0.82 mg/dL — ABNORMAL HIGH (ref <0.60)

## 2011-04-03 NOTE — Progress Notes (Signed)
Quick Note:  Labs faxed to [facility], attn: Sigurd Sos, [phone] ______

## 2011-04-03 NOTE — Progress Notes (Signed)
Quick Note:  Will fax labs to [place], attn: Sigurd Sos at [phone] ______

## 2011-04-09 ENCOUNTER — Telehealth: Payer: Self-pay | Admitting: Family Medicine

## 2011-04-09 ENCOUNTER — Other Ambulatory Visit: Payer: Self-pay | Admitting: Family Medicine

## 2011-04-09 NOTE — Telephone Encounter (Signed)
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 Pt needs to pick this up today. Pt will pick this up when ready.   Also pt would like to get her last lab results from 04/02/11. Pls fax this to Dr Sigurd Sos at [facility]. Pt says that Harriett Sine has the fax # already.

## 2011-04-09 NOTE — Telephone Encounter (Signed)
Immunization record given, labs faxed earlier

## 2011-04-10 DIAGNOSIS — H2510 Age-related nuclear cataract, unspecified eye: Secondary | ICD-10-CM | POA: Diagnosis not present

## 2011-04-17 ENCOUNTER — Telehealth: Payer: Self-pay | Admitting: Family Medicine

## 2011-04-17 NOTE — Telephone Encounter (Signed)
Needs results for blood work & needs a copy sent to Dr. Sigurd Sos at [facility]. Please call pt.

## 2011-04-17 NOTE — Telephone Encounter (Signed)
Labs were faxed to [facility] on 04/03/11, pt informed

## 2011-04-30 ENCOUNTER — Other Ambulatory Visit (INDEPENDENT_AMBULATORY_CARE_PROVIDER_SITE_OTHER): Payer: Medicare Other

## 2011-04-30 DIAGNOSIS — I4891 Unspecified atrial fibrillation: Secondary | ICD-10-CM

## 2011-05-01 LAB — C-REACTIVE PROTEIN: CRP: 1.63 mg/dL — ABNORMAL HIGH (ref <0.60)

## 2011-05-04 NOTE — Progress Notes (Signed)
Quick Note:  Labs faxed to [facility], attn: Kate Mitchell, [phone] ______ 

## 2011-05-04 NOTE — Progress Notes (Signed)
Quick Note:  Labs faxed to [facility] ______

## 2011-05-05 ENCOUNTER — Telehealth: Payer: Self-pay | Admitting: Family Medicine

## 2011-05-05 NOTE — Telephone Encounter (Signed)
Pt called req to get sed rate from last labs. Pls call.

## 2011-05-05 NOTE — Telephone Encounter (Signed)
Pt given results, labs were faxed to [facility], she also has a call in to them

## 2011-05-28 ENCOUNTER — Other Ambulatory Visit (INDEPENDENT_AMBULATORY_CARE_PROVIDER_SITE_OTHER): Payer: Medicare Other

## 2011-05-28 ENCOUNTER — Ambulatory Visit (INDEPENDENT_AMBULATORY_CARE_PROVIDER_SITE_OTHER): Payer: Medicare Other

## 2011-05-28 DIAGNOSIS — I4891 Unspecified atrial fibrillation: Secondary | ICD-10-CM

## 2011-05-29 LAB — C-REACTIVE PROTEIN: CRP: 1.52 mg/dL — ABNORMAL HIGH (ref <0.60)

## 2011-06-01 NOTE — Progress Notes (Signed)
Quick Note:  Labs faxed to Sigurd Sos MD, [phone] ______

## 2011-06-04 ENCOUNTER — Telehealth: Payer: Self-pay | Admitting: Family Medicine

## 2011-06-04 NOTE — Telephone Encounter (Signed)
Pt called req to get lab results. Pt req call back from nurse.

## 2011-06-04 NOTE — Telephone Encounter (Signed)
Pt informed on cell VM 

## 2011-06-05 DIAGNOSIS — M316 Other giant cell arteritis: Secondary | ICD-10-CM | POA: Diagnosis not present

## 2011-06-05 DIAGNOSIS — M069 Rheumatoid arthritis, unspecified: Secondary | ICD-10-CM | POA: Diagnosis not present

## 2011-06-11 ENCOUNTER — Other Ambulatory Visit: Payer: Self-pay | Admitting: Family Medicine

## 2011-06-17 DIAGNOSIS — D239 Other benign neoplasm of skin, unspecified: Secondary | ICD-10-CM | POA: Diagnosis not present

## 2011-06-17 DIAGNOSIS — L821 Other seborrheic keratosis: Secondary | ICD-10-CM | POA: Diagnosis not present

## 2011-06-17 DIAGNOSIS — L570 Actinic keratosis: Secondary | ICD-10-CM | POA: Diagnosis not present

## 2011-06-17 DIAGNOSIS — Z85828 Personal history of other malignant neoplasm of skin: Secondary | ICD-10-CM | POA: Diagnosis not present

## 2011-06-25 ENCOUNTER — Other Ambulatory Visit: Payer: Medicare Other

## 2011-07-01 ENCOUNTER — Other Ambulatory Visit (INDEPENDENT_AMBULATORY_CARE_PROVIDER_SITE_OTHER): Payer: Medicare Other

## 2011-07-01 DIAGNOSIS — I4891 Unspecified atrial fibrillation: Secondary | ICD-10-CM | POA: Diagnosis not present

## 2011-07-01 LAB — SEDIMENTATION RATE: Sed Rate: 21 mm/hr (ref 0–22)

## 2011-07-02 NOTE — Progress Notes (Signed)
Quick Note:  Labs faxed to Dwana Melena at [phone] ______

## 2011-07-02 NOTE — Progress Notes (Signed)
Quick Note:  Labs faxed ______ 

## 2011-07-02 NOTE — Progress Notes (Signed)
Quick Note:  Labs faxed, copy given to pt husband ______

## 2011-07-04 DIAGNOSIS — S72009A Fracture of unspecified part of neck of unspecified femur, initial encounter for closed fracture: Secondary | ICD-10-CM | POA: Diagnosis not present

## 2011-07-04 DIAGNOSIS — Z471 Aftercare following joint replacement surgery: Secondary | ICD-10-CM | POA: Diagnosis not present

## 2011-07-04 DIAGNOSIS — Z96649 Presence of unspecified artificial hip joint: Secondary | ICD-10-CM | POA: Diagnosis not present

## 2011-07-04 DIAGNOSIS — Z01810 Encounter for preprocedural cardiovascular examination: Secondary | ICD-10-CM | POA: Diagnosis not present

## 2011-07-04 DIAGNOSIS — R509 Fever, unspecified: Secondary | ICD-10-CM | POA: Diagnosis not present

## 2011-07-04 DIAGNOSIS — M549 Dorsalgia, unspecified: Secondary | ICD-10-CM | POA: Diagnosis not present

## 2011-07-04 DIAGNOSIS — S20229A Contusion of unspecified back wall of thorax, initial encounter: Secondary | ICD-10-CM | POA: Diagnosis not present

## 2011-07-04 DIAGNOSIS — Z9861 Coronary angioplasty status: Secondary | ICD-10-CM | POA: Diagnosis not present

## 2011-07-04 DIAGNOSIS — J9819 Other pulmonary collapse: Secondary | ICD-10-CM | POA: Diagnosis not present

## 2011-07-04 DIAGNOSIS — M316 Other giant cell arteritis: Secondary | ICD-10-CM | POA: Diagnosis not present

## 2011-07-04 DIAGNOSIS — R918 Other nonspecific abnormal finding of lung field: Secondary | ICD-10-CM | POA: Diagnosis not present

## 2011-07-04 DIAGNOSIS — Z79899 Other long term (current) drug therapy: Secondary | ICD-10-CM | POA: Diagnosis not present

## 2011-07-04 DIAGNOSIS — M4120 Other idiopathic scoliosis, site unspecified: Secondary | ICD-10-CM | POA: Diagnosis not present

## 2011-07-04 DIAGNOSIS — M25559 Pain in unspecified hip: Secondary | ICD-10-CM | POA: Diagnosis not present

## 2011-07-04 DIAGNOSIS — Z9181 History of falling: Secondary | ICD-10-CM | POA: Diagnosis not present

## 2011-07-04 DIAGNOSIS — D62 Acute posthemorrhagic anemia: Secondary | ICD-10-CM | POA: Diagnosis not present

## 2011-07-04 DIAGNOSIS — S7000XA Contusion of unspecified hip, initial encounter: Secondary | ICD-10-CM | POA: Diagnosis not present

## 2011-07-04 DIAGNOSIS — M5137 Other intervertebral disc degeneration, lumbosacral region: Secondary | ICD-10-CM | POA: Diagnosis not present

## 2011-07-04 DIAGNOSIS — S72033A Displaced midcervical fracture of unspecified femur, initial encounter for closed fracture: Secondary | ICD-10-CM | POA: Diagnosis not present

## 2011-07-04 DIAGNOSIS — IMO0002 Reserved for concepts with insufficient information to code with codable children: Secondary | ICD-10-CM | POA: Diagnosis not present

## 2011-07-04 DIAGNOSIS — Z9889 Other specified postprocedural states: Secondary | ICD-10-CM | POA: Diagnosis not present

## 2011-07-04 DIAGNOSIS — S335XXA Sprain of ligaments of lumbar spine, initial encounter: Secondary | ICD-10-CM | POA: Diagnosis not present

## 2011-07-04 DIAGNOSIS — Z01818 Encounter for other preprocedural examination: Secondary | ICD-10-CM | POA: Diagnosis not present

## 2011-07-04 DIAGNOSIS — I10 Essential (primary) hypertension: Secondary | ICD-10-CM | POA: Diagnosis present

## 2011-07-04 DIAGNOSIS — I2510 Atherosclerotic heart disease of native coronary artery without angina pectoris: Secondary | ICD-10-CM | POA: Diagnosis not present

## 2011-07-06 ENCOUNTER — Telehealth: Payer: Self-pay | Admitting: Cardiovascular Disease

## 2011-07-06 NOTE — Telephone Encounter (Signed)
LOV,12,Doppler faxed to [facility] @ [phone] 07/06/11/KM

## 2011-07-10 ENCOUNTER — Other Ambulatory Visit: Payer: Self-pay | Admitting: Family Medicine

## 2011-07-10 DIAGNOSIS — IMO0001 Reserved for inherently not codable concepts without codable children: Secondary | ICD-10-CM | POA: Diagnosis not present

## 2011-07-10 DIAGNOSIS — I2510 Atherosclerotic heart disease of native coronary artery without angina pectoris: Secondary | ICD-10-CM | POA: Diagnosis not present

## 2011-07-10 DIAGNOSIS — Z7901 Long term (current) use of anticoagulants: Secondary | ICD-10-CM | POA: Diagnosis not present

## 2011-07-10 DIAGNOSIS — Z96649 Presence of unspecified artificial hip joint: Secondary | ICD-10-CM | POA: Diagnosis not present

## 2011-07-10 DIAGNOSIS — Z471 Aftercare following joint replacement surgery: Secondary | ICD-10-CM | POA: Diagnosis not present

## 2011-07-13 DIAGNOSIS — Z96649 Presence of unspecified artificial hip joint: Secondary | ICD-10-CM | POA: Diagnosis not present

## 2011-07-13 DIAGNOSIS — IMO0001 Reserved for inherently not codable concepts without codable children: Secondary | ICD-10-CM | POA: Diagnosis not present

## 2011-07-13 DIAGNOSIS — I2510 Atherosclerotic heart disease of native coronary artery without angina pectoris: Secondary | ICD-10-CM | POA: Diagnosis not present

## 2011-07-13 DIAGNOSIS — Z471 Aftercare following joint replacement surgery: Secondary | ICD-10-CM | POA: Diagnosis not present

## 2011-07-13 DIAGNOSIS — Z7901 Long term (current) use of anticoagulants: Secondary | ICD-10-CM | POA: Diagnosis not present

## 2011-07-15 DIAGNOSIS — Z471 Aftercare following joint replacement surgery: Secondary | ICD-10-CM | POA: Diagnosis not present

## 2011-07-15 DIAGNOSIS — Z96649 Presence of unspecified artificial hip joint: Secondary | ICD-10-CM | POA: Diagnosis not present

## 2011-07-15 DIAGNOSIS — I2510 Atherosclerotic heart disease of native coronary artery without angina pectoris: Secondary | ICD-10-CM | POA: Diagnosis not present

## 2011-07-15 DIAGNOSIS — Z7901 Long term (current) use of anticoagulants: Secondary | ICD-10-CM | POA: Diagnosis not present

## 2011-07-15 DIAGNOSIS — IMO0001 Reserved for inherently not codable concepts without codable children: Secondary | ICD-10-CM | POA: Diagnosis not present

## 2011-07-16 DIAGNOSIS — Z471 Aftercare following joint replacement surgery: Secondary | ICD-10-CM | POA: Diagnosis not present

## 2011-07-16 DIAGNOSIS — Z7901 Long term (current) use of anticoagulants: Secondary | ICD-10-CM | POA: Diagnosis not present

## 2011-07-16 DIAGNOSIS — IMO0001 Reserved for inherently not codable concepts without codable children: Secondary | ICD-10-CM | POA: Diagnosis not present

## 2011-07-16 DIAGNOSIS — I2510 Atherosclerotic heart disease of native coronary artery without angina pectoris: Secondary | ICD-10-CM | POA: Diagnosis not present

## 2011-07-16 DIAGNOSIS — Z96649 Presence of unspecified artificial hip joint: Secondary | ICD-10-CM | POA: Diagnosis not present

## 2011-07-17 DIAGNOSIS — S72009A Fracture of unspecified part of neck of unspecified femur, initial encounter for closed fracture: Secondary | ICD-10-CM | POA: Diagnosis not present

## 2011-07-20 DIAGNOSIS — Z471 Aftercare following joint replacement surgery: Secondary | ICD-10-CM | POA: Diagnosis not present

## 2011-07-20 DIAGNOSIS — IMO0001 Reserved for inherently not codable concepts without codable children: Secondary | ICD-10-CM | POA: Diagnosis not present

## 2011-07-20 DIAGNOSIS — I2510 Atherosclerotic heart disease of native coronary artery without angina pectoris: Secondary | ICD-10-CM | POA: Diagnosis not present

## 2011-07-20 DIAGNOSIS — Z96649 Presence of unspecified artificial hip joint: Secondary | ICD-10-CM | POA: Diagnosis not present

## 2011-07-20 DIAGNOSIS — Z7901 Long term (current) use of anticoagulants: Secondary | ICD-10-CM | POA: Diagnosis not present

## 2011-07-22 DIAGNOSIS — Z7901 Long term (current) use of anticoagulants: Secondary | ICD-10-CM | POA: Diagnosis not present

## 2011-07-22 DIAGNOSIS — IMO0001 Reserved for inherently not codable concepts without codable children: Secondary | ICD-10-CM | POA: Diagnosis not present

## 2011-07-22 DIAGNOSIS — Z471 Aftercare following joint replacement surgery: Secondary | ICD-10-CM | POA: Diagnosis not present

## 2011-07-22 DIAGNOSIS — I2510 Atherosclerotic heart disease of native coronary artery without angina pectoris: Secondary | ICD-10-CM | POA: Diagnosis not present

## 2011-07-22 DIAGNOSIS — Z96649 Presence of unspecified artificial hip joint: Secondary | ICD-10-CM | POA: Diagnosis not present

## 2011-07-24 DIAGNOSIS — Z96649 Presence of unspecified artificial hip joint: Secondary | ICD-10-CM | POA: Diagnosis not present

## 2011-07-24 DIAGNOSIS — Z7901 Long term (current) use of anticoagulants: Secondary | ICD-10-CM | POA: Diagnosis not present

## 2011-07-24 DIAGNOSIS — I2510 Atherosclerotic heart disease of native coronary artery without angina pectoris: Secondary | ICD-10-CM | POA: Diagnosis not present

## 2011-07-24 DIAGNOSIS — IMO0001 Reserved for inherently not codable concepts without codable children: Secondary | ICD-10-CM | POA: Diagnosis not present

## 2011-07-24 DIAGNOSIS — Z471 Aftercare following joint replacement surgery: Secondary | ICD-10-CM | POA: Diagnosis not present

## 2011-07-27 DIAGNOSIS — Z7901 Long term (current) use of anticoagulants: Secondary | ICD-10-CM | POA: Diagnosis not present

## 2011-07-27 DIAGNOSIS — Z471 Aftercare following joint replacement surgery: Secondary | ICD-10-CM | POA: Diagnosis not present

## 2011-07-27 DIAGNOSIS — I2510 Atherosclerotic heart disease of native coronary artery without angina pectoris: Secondary | ICD-10-CM | POA: Diagnosis not present

## 2011-07-27 DIAGNOSIS — IMO0001 Reserved for inherently not codable concepts without codable children: Secondary | ICD-10-CM | POA: Diagnosis not present

## 2011-07-27 DIAGNOSIS — Z96649 Presence of unspecified artificial hip joint: Secondary | ICD-10-CM | POA: Diagnosis not present

## 2011-07-29 ENCOUNTER — Other Ambulatory Visit (INDEPENDENT_AMBULATORY_CARE_PROVIDER_SITE_OTHER): Payer: Medicare Other

## 2011-07-29 DIAGNOSIS — Z96649 Presence of unspecified artificial hip joint: Secondary | ICD-10-CM | POA: Diagnosis not present

## 2011-07-29 DIAGNOSIS — I2510 Atherosclerotic heart disease of native coronary artery without angina pectoris: Secondary | ICD-10-CM | POA: Diagnosis not present

## 2011-07-29 DIAGNOSIS — I4891 Unspecified atrial fibrillation: Secondary | ICD-10-CM | POA: Diagnosis not present

## 2011-07-29 DIAGNOSIS — Z471 Aftercare following joint replacement surgery: Secondary | ICD-10-CM | POA: Diagnosis not present

## 2011-07-29 DIAGNOSIS — Z7901 Long term (current) use of anticoagulants: Secondary | ICD-10-CM | POA: Diagnosis not present

## 2011-07-29 DIAGNOSIS — IMO0001 Reserved for inherently not codable concepts without codable children: Secondary | ICD-10-CM | POA: Diagnosis not present

## 2011-07-29 LAB — HIGH SENSITIVITY CRP: CRP, High Sensitivity: 15.55 mg/L — ABNORMAL HIGH (ref 0.000–5.000)

## 2011-07-29 LAB — SEDIMENTATION RATE: Sed Rate: 45 mm/hr — ABNORMAL HIGH (ref 0–22)

## 2011-07-30 ENCOUNTER — Other Ambulatory Visit (HOSPITAL_COMMUNITY): Payer: Medicare Other

## 2011-07-30 ENCOUNTER — Encounter (HOSPITAL_COMMUNITY): Payer: Self-pay | Admitting: Emergency Medicine

## 2011-07-30 ENCOUNTER — Emergency Department (HOSPITAL_COMMUNITY)
Admission: EM | Admit: 2011-07-30 | Discharge: 2011-07-30 | Disposition: A | Discharge status: Home / Self Care | Payer: Medicare Other | Attending: Emergency Medicine | Admitting: Emergency Medicine

## 2011-07-30 ENCOUNTER — Emergency Department (HOSPITAL_COMMUNITY): Payer: Medicare Other

## 2011-07-30 DIAGNOSIS — Z9089 Acquired absence of other organs: Secondary | ICD-10-CM | POA: Diagnosis not present

## 2011-07-30 DIAGNOSIS — R109 Unspecified abdominal pain: Secondary | ICD-10-CM | POA: Insufficient documentation

## 2011-07-30 DIAGNOSIS — R10815 Periumbilic abdominal tenderness: Secondary | ICD-10-CM | POA: Diagnosis not present

## 2011-07-30 DIAGNOSIS — I2510 Atherosclerotic heart disease of native coronary artery without angina pectoris: Secondary | ICD-10-CM | POA: Insufficient documentation

## 2011-07-30 DIAGNOSIS — R10819 Abdominal tenderness, unspecified site: Secondary | ICD-10-CM | POA: Diagnosis not present

## 2011-07-30 DIAGNOSIS — R509 Fever, unspecified: Secondary | ICD-10-CM | POA: Diagnosis not present

## 2011-07-30 DIAGNOSIS — R112 Nausea with vomiting, unspecified: Secondary | ICD-10-CM | POA: Diagnosis not present

## 2011-07-30 DIAGNOSIS — I10 Essential (primary) hypertension: Secondary | ICD-10-CM | POA: Diagnosis not present

## 2011-07-30 DIAGNOSIS — E785 Hyperlipidemia, unspecified: Secondary | ICD-10-CM | POA: Insufficient documentation

## 2011-07-30 LAB — DIFFERENTIAL
Basophils Absolute: 0 10*3/uL (ref 0.0–0.1)
Basophils Relative: 0 % (ref 0–1)
Lymphocytes Relative: 8 % — ABNORMAL LOW (ref 12–46)
Neutro Abs: 5.8 10*3/uL (ref 1.7–7.7)
Neutrophils Relative %: 88 % — ABNORMAL HIGH (ref 43–77)

## 2011-07-30 LAB — COMPREHENSIVE METABOLIC PANEL
ALT: 12 U/L (ref 0–35)
AST: 16 U/L (ref 0–37)
Albumin: 3.1 g/dL — ABNORMAL LOW (ref 3.5–5.2)
CO2: 28 mEq/L (ref 19–32)
Calcium: 8.5 mg/dL (ref 8.4–10.5)
Chloride: 103 mEq/L (ref 96–112)
GFR calc non Af Amer: 85 mL/min — ABNORMAL LOW (ref >90)
Sodium: 140 mEq/L (ref 135–145)
Total Bilirubin: 0.7 mg/dL (ref 0.3–1.2)

## 2011-07-30 LAB — URINALYSIS, ROUTINE W REFLEX MICROSCOPIC
Bilirubin Urine: NEGATIVE
Glucose, UA: NEGATIVE mg/dL
Hgb urine dipstick: NEGATIVE
Specific Gravity, Urine: 1.016 (ref 1.005–1.030)
Urobilinogen, UA: 1 mg/dL (ref 0.0–1.0)

## 2011-07-30 LAB — CBC
MCHC: 33 g/dL (ref 30.0–36.0)
Platelets: 166 10*3/uL (ref 150–400)
RDW: 13.7 % (ref 11.5–15.5)
WBC: 6.6 10*3/uL (ref 4.0–10.5)

## 2011-07-30 MED ORDER — MORPHINE SULFATE 4 MG/ML IJ SOLN
4.0000 mg | Freq: Once | INTRAMUSCULAR | Status: AC
  Administered 2011-07-30: 4 mg via INTRAVENOUS
  Filled 2011-07-30: qty 1

## 2011-07-30 MED ORDER — ACETAMINOPHEN 325 MG PO TABS
650.0000 mg | ORAL_TABLET | Freq: Once | ORAL | Status: AC
  Administered 2011-07-30: 650 mg via ORAL
  Filled 2011-07-30: qty 2

## 2011-07-30 MED ORDER — HYOSCYAMINE SULFATE CR 0.375 MG PO CP12: 0.3750 mg | ORAL_CAPSULE | Freq: Two times a day (BID) | ORAL | Status: DC | PRN

## 2011-07-30 MED ORDER — SODIUM CHLORIDE 0.9 % IV SOLN
INTRAVENOUS | Status: DC
  Administered 2011-07-30: 1258 mL via INTRAVENOUS

## 2011-07-30 MED ORDER — ONDANSETRON HCL 4 MG/2ML IJ SOLN
4.0000 mg | Freq: Once | INTRAMUSCULAR | Status: AC
  Administered 2011-07-30: 4 mg via INTRAVENOUS
  Filled 2011-07-30: qty 2

## 2011-07-30 MED ORDER — IOHEXOL 300 MG/ML  SOLN
100.0000 mL | Freq: Once | INTRAMUSCULAR | Status: AC | PRN
  Administered 2011-07-30: 100 mL via INTRAVENOUS

## 2011-07-30 NOTE — Progress Notes (Signed)
Quick Note:  Labs faxed to Dwana Melena at [phone] [facility]) ______

## 2011-07-30 NOTE — Discharge Instructions (Signed)
Regarding blood tests are normal.  Your CAT scan does not show any signs of abscesses, appendicitis or other significant illness.  Use ibuprofen or Tylenol for fever and Levsin for pain.  Followup with your Dr. for reevaluation.  If your symptoms persist.  Return for worse or uncontrolled symptoms

## 2011-07-30 NOTE — ED Notes (Signed)
Pt to CT

## 2011-07-30 NOTE — ED Provider Notes (Signed)
History     CSN: [id number]  Arrival date & time 07/30/11  0704   First MD Initiated Contact with Patient 07/30/11 [phone]      Chief Complaint  Patient presents with  . Abdominal Pain    (Consider location/radiation/quality/duration/timing/severity/associated sxs/prior treatment) Patient is a 77 y.o. female presenting with abdominal pain. The history is provided by the patient.  Abdominal Pain The primary symptoms of the illness include abdominal pain, fever, nausea and vomiting. The primary symptoms of the illness do not include shortness of breath, diarrhea or dysuria.  Symptoms associated with the illness do not include hematuria.   the patient is a 77 year old, female, who has a history of cholecystectomy, who presents to the emergency department complaining of abdominal pain, and fever.  Her abdominal pain.  Has been intermittent for greater than a month.  However, she developed a fever.  Last night.  She had nausea and vomiting.  Today.  She denies diarrhea.  She denies urinary symptoms, and she denies respiratory symptoms.  Past Medical History  Diagnosis Date  . Arthritis   . Headache   . Hypertension   . Hyperlipidemia   . Incontinence of urine   . GERD (gastroesophageal reflux disease)   . Temporal arteritis 2011  . CAD (coronary artery disease)     Stent RCA in [facility]    Past Surgical History  Procedure Date  . Cholecystectomy   . Tonsillectomy and adenoidectomy   . Abdominal hysterectomy age 56    TAH  . Bladder suspension     Family History  Problem Relation Age of Onset  . Cancer Daughter     breast  . Arthritis Neg Hx   . Heart disease Other   . Hypertension Other   . Heart attack Father   . Cancer Mother     History  Substance Use Topics  . Smoking status: Never Smoker   . Smokeless tobacco: Not on file  . Alcohol Use: 1.0 oz/week    2 drink(s) per week    OB History    Grav Para Term Preterm Abortions TAB SAB Ect Mult Living                Review of Systems  Constitutional: Positive for fever.  Respiratory: Negative for cough and shortness of breath.   Cardiovascular: Negative for chest pain.  Gastrointestinal: Positive for nausea, vomiting and abdominal pain. Negative for diarrhea.  Genitourinary: Negative for dysuria and hematuria.  Neurological: Negative for headaches.  Psychiatric/Behavioral: Negative for confusion.  All other systems reviewed and are negative.    Allergies  Review of patient's allergies indicates no known allergies.  Home Medications   Current Outpatient Rx  Name Route Sig Dispense Refill  . ALPRAZOLAM 0.5 MG PO TABS Oral Take 0.5 mg by mouth at bedtime as needed.      Marland Kitchen AMLODIPINE BESYLATE 5 MG PO TABS  TAKE 1 TABLET BY MOUTH EVERY DAY 30 tablet 6  . ASPIRIN 81 MG PO TABS Oral Take 81 mg by mouth 2 (two) times daily.     . ATORVASTATIN CALCIUM 20 MG PO TABS Oral Take 20 mg by mouth daily.      Marland Kitchen HYDROCODONE-ACETAMINOPHEN 10-325 MG PO TABS Oral Take 1 tablet by mouth every 6 (six) hours as needed.      Marland Kitchen LOSARTAN POTASSIUM-HCTZ 50-12.5 MG PO TABS Oral Take 1 tablet by mouth daily.      Marland Kitchen METOPROLOL TARTRATE 50 MG PO TABS  TAKE 1 TABLET BY MOUTH TWICE DAILY 60 tablet 5  . OMEPRAZOLE 20 MG PO CPDR  TAKE 1 CAPSULE BY MOUTH EVERY DAY 90 capsule 3  . POTASSIUM GLUCONATE 550 MG PO TABS Oral Take by mouth daily.      Marland Kitchen PREDNISONE 1 MG PO TABS  4 tabs daily in the am along with a 5 mg tab     . PREDNISONE 5 MG PO TABS Oral Take 5 mg by mouth daily.     Marland Kitchen TEMAZEPAM 30 MG PO CAPS Oral Take 1 capsule (30 mg total) by mouth at bedtime as needed. 30 capsule 1    BP 112/64  Pulse 88  Temp(Src) 100.3 F (37.9 C) (Oral)  Resp 17  Ht 5\' 1"  (1.549 m)  Wt 135 lb (61.236 kg)  BMI 25.51 kg/m2  SpO2 95%  Physical Exam  Vitals reviewed. Constitutional: She is oriented to person, place, and time. She appears well-developed and well-nourished. No distress.  HENT:  Head: Normocephalic and  atraumatic.  Eyes: Conjunctivae are normal.  Neck: Normal range of motion. Neck supple.  Cardiovascular: Normal rate.   No murmur heard. Pulmonary/Chest: Effort normal. No respiratory distress.  Abdominal: Soft. Bowel sounds are normal. She exhibits no distension. There is tenderness. There is no rebound and no guarding.       Tenderness in the right.  Abdomen just lateral to the umbilical area  Musculoskeletal: Normal range of motion.  Neurological: She is alert and oriented to person, place, and time.  Skin: Skin is warm and dry.  Psychiatric: She has a normal mood and affect. Thought content normal.    ED Course  Procedures (including critical care time) 77 year old, female, presents emergency department with right sided abdominal pain, and fever.  She has had a cholecystectomy previously.  I'm concerned that she may have an appendicitis or intra-abdominal abscess.  We will perform laboratory testing, and CAT scan of her abdomen.  Establish an IV and treat her symptomatically.   Labs Reviewed  CBC  DIFFERENTIAL  COMPREHENSIVE METABOLIC PANEL  URINALYSIS, ROUTINE W REFLEX MICROSCOPIC   No results found.   No diagnosis found.    MDM   Abdominal pain, with fever No signs of serious intraabdominal dz.  No abscess, appendicitis.  No uti/pyelo.   No peritonitis.        Cheri Guppy, MD 07/30/11 1053

## 2011-07-30 NOTE — ED Notes (Signed)
Pt alert, nad, arrives from home, c/o RUQ pain, onset a month ago, pt associates it with hip sx from April, pt ambulates to triage, resp even unlabored, emesis pta, resp even unlabored, skin pwd

## 2011-07-30 NOTE — ED Notes (Signed)
Pt returned from CT °

## 2011-07-31 DIAGNOSIS — Z96649 Presence of unspecified artificial hip joint: Secondary | ICD-10-CM | POA: Diagnosis not present

## 2011-07-31 DIAGNOSIS — IMO0001 Reserved for inherently not codable concepts without codable children: Secondary | ICD-10-CM | POA: Diagnosis not present

## 2011-07-31 DIAGNOSIS — I2510 Atherosclerotic heart disease of native coronary artery without angina pectoris: Secondary | ICD-10-CM | POA: Diagnosis not present

## 2011-07-31 DIAGNOSIS — Z471 Aftercare following joint replacement surgery: Secondary | ICD-10-CM | POA: Diagnosis not present

## 2011-07-31 DIAGNOSIS — Z7901 Long term (current) use of anticoagulants: Secondary | ICD-10-CM | POA: Diagnosis not present

## 2011-08-03 DIAGNOSIS — Z96649 Presence of unspecified artificial hip joint: Secondary | ICD-10-CM | POA: Diagnosis not present

## 2011-08-03 DIAGNOSIS — IMO0001 Reserved for inherently not codable concepts without codable children: Secondary | ICD-10-CM | POA: Diagnosis not present

## 2011-08-03 DIAGNOSIS — Z7901 Long term (current) use of anticoagulants: Secondary | ICD-10-CM | POA: Diagnosis not present

## 2011-08-03 DIAGNOSIS — Z471 Aftercare following joint replacement surgery: Secondary | ICD-10-CM | POA: Diagnosis not present

## 2011-08-03 DIAGNOSIS — I2510 Atherosclerotic heart disease of native coronary artery without angina pectoris: Secondary | ICD-10-CM | POA: Diagnosis not present

## 2011-08-04 DIAGNOSIS — Z7901 Long term (current) use of anticoagulants: Secondary | ICD-10-CM | POA: Diagnosis not present

## 2011-08-04 DIAGNOSIS — I2510 Atherosclerotic heart disease of native coronary artery without angina pectoris: Secondary | ICD-10-CM | POA: Diagnosis not present

## 2011-08-04 DIAGNOSIS — IMO0001 Reserved for inherently not codable concepts without codable children: Secondary | ICD-10-CM | POA: Diagnosis not present

## 2011-08-04 DIAGNOSIS — Z96649 Presence of unspecified artificial hip joint: Secondary | ICD-10-CM | POA: Diagnosis not present

## 2011-08-04 DIAGNOSIS — Z471 Aftercare following joint replacement surgery: Secondary | ICD-10-CM | POA: Diagnosis not present

## 2011-08-05 ENCOUNTER — Ambulatory Visit (INDEPENDENT_AMBULATORY_CARE_PROVIDER_SITE_OTHER)
Admission: RE | Admit: 2011-08-05 | Discharge: 2011-08-05 | Disposition: A | Discharge status: Home / Self Care | Payer: Medicare Other | Source: Ambulatory Visit | Attending: Family Medicine | Admitting: Family Medicine

## 2011-08-05 ENCOUNTER — Encounter: Payer: Self-pay | Admitting: Family Medicine

## 2011-08-05 ENCOUNTER — Ambulatory Visit (INDEPENDENT_AMBULATORY_CARE_PROVIDER_SITE_OTHER): Payer: Medicare Other | Admitting: Family Medicine

## 2011-08-05 VITALS — BP 150/80 | Temp 97.9°F | Wt 138.0 lb

## 2011-08-05 DIAGNOSIS — R1031 Right lower quadrant pain: Secondary | ICD-10-CM | POA: Diagnosis not present

## 2011-08-05 DIAGNOSIS — Z96649 Presence of unspecified artificial hip joint: Secondary | ICD-10-CM | POA: Diagnosis not present

## 2011-08-05 DIAGNOSIS — D649 Anemia, unspecified: Secondary | ICD-10-CM | POA: Diagnosis not present

## 2011-08-05 DIAGNOSIS — E876 Hypokalemia: Secondary | ICD-10-CM | POA: Diagnosis not present

## 2011-08-05 DIAGNOSIS — M4120 Other idiopathic scoliosis, site unspecified: Secondary | ICD-10-CM | POA: Diagnosis not present

## 2011-08-05 DIAGNOSIS — R109 Unspecified abdominal pain: Secondary | ICD-10-CM | POA: Diagnosis not present

## 2011-08-05 MED ORDER — POTASSIUM CHLORIDE CRYS ER 20 MEQ PO TBCR: 20.0000 meq | EXTENDED_RELEASE_TABLET | Freq: Every day | ORAL | Status: DC

## 2011-08-05 NOTE — Progress Notes (Signed)
  Subjective:    Patient ID: Betty Jackson, female    DOB: [birth year], 77 y.o.   MRN: [id number]  HPI  Emergency room followup. Patient had recent total hip replacement and did well following that surgery. She presented on 07/30/2011 with right-sided abdominal pain, fever, nausea, and vomiting. No dysuria. History of prior cholecystectomy. Patient had lab work including urinalysis which was unremarkable. CT scan revealed no evidence for appendicitis and no evidence for diverticulitis. No acute findings. Her labs were significant for hemoglobin 9.2 with normal white blood count. Potassium 3.1. She was prescribed hyoscyamine and has not yet taken this. At this point she has no fever and nausea and vomiting have resolved. She still has some sporadic right mid quadrant abdominal pain which is actually for years off and on. She had previous colonoscopy reportedly about 5 years ago which was normal.  She had some constipation intermittently following her hip surgery was taking oxycodone but has now weaned off.  couple of loose stools past couple days. No history of impaction.  Past Medical History  Diagnosis Date  . Arthritis   . Headache   . Hypertension   . Hyperlipidemia   . Incontinence of urine   . GERD (gastroesophageal reflux disease)   . Temporal arteritis 2011  . CAD (coronary artery disease)     Stent RCA in [facility]   Past Surgical History  Procedure Date  . Cholecystectomy   . Tonsillectomy and adenoidectomy   . Abdominal hysterectomy age 24    TAH  . Bladder suspension     reports that she has never smoked. She does not have any smokeless tobacco history on file. She reports that she drinks about one ounce of alcohol per week. She reports that she does not use illicit drugs. family history includes Cancer in her daughter and mother; Heart attack in her father; Heart disease in her other; and Hypertension in her other.  There is no history of Arthritis. No Known  Allergies    Review of Systems  Constitutional: Positive for appetite change (poor appetite). Negative for fever and chills.  Respiratory: Negative for cough and shortness of breath.   Cardiovascular: Negative for chest pain.  Gastrointestinal: Negative for nausea, vomiting and abdominal pain.  Genitourinary: Negative for dysuria and hematuria.       Objective:   Physical Exam  Constitutional: She appears well-developed and well-nourished.  HENT:  Mouth/Throat: Oropharynx is clear and moist.  Cardiovascular: Normal rate and regular rhythm.   Pulmonary/Chest: Breath sounds normal. No respiratory distress. She has no wheezes. She has no rales.  Abdominal: Soft. Bowel sounds are normal. She exhibits no distension and no mass. There is no tenderness. There is no rebound and no guarding.  Musculoskeletal: She exhibits no edema.          Assessment & Plan:  Recurrent right mid quadrant abdominal pain. Recent evaluation as above unremarkable with normal CT scan. Question scar tissue from prior surgery. Would not explain her fever. Rule out right-sided stool from her recent narcotic use and impaction. She is having loose stools at this time. We'll check KUB  Anemia likely related to her prior hip replacement. Start iron replacement and recheck CBC in 2 weeks  Hypokalemia. Start K-Dur 20 milligrams daily and recheck basic metabolic panel in 2 weeks

## 2011-08-05 NOTE — Patient Instructions (Signed)
Start over the counter iron replacement one twice daily. Consider stool softener as needed for constipation

## 2011-08-06 ENCOUNTER — Telehealth: Payer: Self-pay | Admitting: Family Medicine

## 2011-08-06 DIAGNOSIS — IMO0001 Reserved for inherently not codable concepts without codable children: Secondary | ICD-10-CM | POA: Diagnosis not present

## 2011-08-06 DIAGNOSIS — Z7901 Long term (current) use of anticoagulants: Secondary | ICD-10-CM | POA: Diagnosis not present

## 2011-08-06 DIAGNOSIS — Z471 Aftercare following joint replacement surgery: Secondary | ICD-10-CM | POA: Diagnosis not present

## 2011-08-06 DIAGNOSIS — I2510 Atherosclerotic heart disease of native coronary artery without angina pectoris: Secondary | ICD-10-CM | POA: Diagnosis not present

## 2011-08-06 DIAGNOSIS — Z96649 Presence of unspecified artificial hip joint: Secondary | ICD-10-CM | POA: Diagnosis not present

## 2011-08-06 NOTE — Progress Notes (Signed)
Quick Note:  Pt informed ______ 

## 2011-08-06 NOTE — Telephone Encounter (Signed)
Pt informed

## 2011-08-06 NOTE — Telephone Encounter (Signed)
Pt requesting results of x-ray. Please contact

## 2011-08-07 ENCOUNTER — Telehealth: Payer: Self-pay | Admitting: Family Medicine

## 2011-08-07 MED ORDER — ATORVASTATIN CALCIUM 20 MG PO TABS: 20.0000 mg | ORAL_TABLET | Freq: Every day | ORAL | Status: DC

## 2011-08-07 NOTE — Telephone Encounter (Signed)
Pt requesting refill on atorvastatin (LIPITOR) 20 MG tablet   [facility]

## 2011-08-10 ENCOUNTER — Ambulatory Visit (INDEPENDENT_AMBULATORY_CARE_PROVIDER_SITE_OTHER): Payer: Medicare Other | Admitting: Family Medicine

## 2011-08-10 ENCOUNTER — Encounter: Payer: Self-pay | Admitting: Family Medicine

## 2011-08-10 DIAGNOSIS — E876 Hypokalemia: Secondary | ICD-10-CM | POA: Diagnosis not present

## 2011-08-10 DIAGNOSIS — F329 Major depressive disorder, single episode, unspecified: Secondary | ICD-10-CM

## 2011-08-10 DIAGNOSIS — R109 Unspecified abdominal pain: Secondary | ICD-10-CM

## 2011-08-10 DIAGNOSIS — D649 Anemia, unspecified: Secondary | ICD-10-CM | POA: Diagnosis not present

## 2011-08-10 LAB — CBC WITH DIFFERENTIAL/PLATELET
Basophils Absolute: 0.1 10*3/uL (ref 0.0–0.1)
Basophils Relative: 0.6 % (ref 0.0–3.0)
Eosinophils Absolute: 0 10*3/uL (ref 0.0–0.7)
Eosinophils Relative: 0 % (ref 0.0–5.0)
HCT: 33.9 % — ABNORMAL LOW (ref 36.0–46.0)
Hemoglobin: 11.4 g/dL — ABNORMAL LOW (ref 12.0–15.0)
Lymphocytes Relative: 14.2 % (ref 12.0–46.0)
Lymphs Abs: 1.4 10*3/uL (ref 0.7–4.0)
MCHC: 33.5 g/dL (ref 30.0–36.0)
MCV: 85 fl (ref 78.0–100.0)
Monocytes Absolute: 0.7 10*3/uL (ref 0.1–1.0)
Monocytes Relative: 6.9 % (ref 3.0–12.0)
Neutro Abs: 7.8 10*3/uL — ABNORMAL HIGH (ref 1.4–7.7)
Neutrophils Relative %: 78.3 % — ABNORMAL HIGH (ref 43.0–77.0)
Platelets: 236 10*3/uL (ref 150.0–400.0)
RBC: 3.99 Mil/uL (ref 3.87–5.11)
RDW: 14.7 % — ABNORMAL HIGH (ref 11.5–14.6)
WBC: 10 10*3/uL (ref 4.5–10.5)

## 2011-08-10 LAB — SEDIMENTATION RATE: Sed Rate: 33 mm/hr — ABNORMAL HIGH (ref 0–22)

## 2011-08-10 LAB — BASIC METABOLIC PANEL
BUN: 14 mg/dL (ref 6–23)
Calcium: 8.9 mg/dL (ref 8.4–10.5)
Creatinine, Ser: 0.7 mg/dL (ref 0.4–1.2)
GFR: 85.27 mL/min (ref >60.00)
Glucose, Bld: 90 mg/dL (ref 70–99)

## 2011-08-10 MED ORDER — MIRTAZAPINE 15 MG PO TBDP: 15.0000 mg | ORAL_TABLET | Freq: Every day | ORAL | Status: DC

## 2011-08-10 NOTE — Progress Notes (Signed)
Subjective:    Patient ID: Betty Jackson, female    DOB: 02/22/1935, 77 y.o.   MRN: [id number]  HPI  Patient presents with persistent right-sided pain worsening yesterday. Relatively stable today. Refer to prior note. She had recent right total hip or placement seems to be recovering well. However, she had episode of fever and right-sided pain went to emergency room recently with unremarkable CT abdomen and pelvis. CBC revealed hemoglobin 9.2 and potassium 3.1 with normal white count. No evidence for appendicitis. She's had somewhat chronic intermittent right-sided abdominal pain. Denies dysuria.  We obtained recent plain films to rule out right-sided stool impaction and this revealed no evidence for that. She has had no evidence for small bowel obstruction. Stools are more regular now.  Since her surgery she's had decreased appetite and some weight loss of about 3 pounds since last visit. Daughter relates that she's had crying spells off and on it poor sleep and low motivation. No suicidal ideation.  Past Medical History  Diagnosis Date  . Arthritis   . Headache   . Hypertension   . Hyperlipidemia   . Incontinence of urine   . GERD (gastroesophageal reflux disease)   . Temporal arteritis 2011  . CAD (coronary artery disease)     St[facility] RCA [facility]   Past Surgical History  Procedure Date  . Cholecystectomy   . Tonsillectomy and adenoidectomy   . Abdominal hysterectomy age 74    TAH  . Bladder suspension     reports that she has never smoked. She does not have any smokeless tobacco history on file. She reports that she drinks about one ounce of alcohol per week. She reports that she does not use illicit drugs. family history includes Cancer in her daughter and mother; Heart attack in her father; Heart disease in her other; and Hypertension in her other.  There is no history of Arthritis. No Known Allergies    Review of Systems  Constitutional: Positive for fever  (yesterday), fatigue and unexpected weight change. Negative for chills.  HENT: Negative for ear pain, congestion and sore throat.   Respiratory: Negative for cough and shortness of breath.   Cardiovascular: Negative for chest pain and leg swelling.  Gastrointestinal: Positive for abdominal pain. Negative for nausea, vomiting, diarrhea, constipation and blood in stool.  Genitourinary: Negative for dysuria and frequency.  Skin: Negative for rash.  Neurological: Negative for dizziness and headaches.  Hematological: Negative for adenopathy.  Psychiatric/Behavioral: Positive for dysphoric mood. Negative for suicidal ideas.       Objective:   Physical Exam  Constitutional: She is oriented to person, place, and time. She appears well-developed and well-nourished.  HENT:  Right Ear: External ear normal.  Left Ear: External ear normal.  Mouth/Throat: Oropharynx is clear and moist.  Neck: Neck supple. No thyromegaly present.  Cardiovascular: Normal rate and regular rhythm.   Pulmonary/Chest: Effort normal and breath sounds normal. No respiratory distress. She has no wheezes. She has no rales.  Abdominal: Soft. Bowel sounds are normal. She exhibits no distension and no mass. There is no rebound and no guarding.       Very minimal tenderness to deep palpation right mid quadrant. No masses. No clear hernia  Musculoskeletal: She exhibits no edema.  Lymphadenopathy:    She has no cervical adenopathy.  Neurological: She is alert and oriented to person, place, and time.  Psychiatric: She has a normal mood and affect.  Assessment & Plan:  #1 recurrent mid quadrant abdominal pain. Recent CT abdomen and pelvis no clear etiology. Not clear if this is related to her recent fever. Recheck CBC #2 recent mild hypokalemia. On potassium replacement. Check basic metabolic panel #3 anemia probably related to her recent right hip surgery- hip replacement. Check CBC #4 probable major depressive  episode. She's had symptoms of lethargy, decreased motivation, poor sleep, decreased appetite, frequent crying spells. Start Remeron 15 mg once nightly and reassess followup visit in less than 2 weeks #5 recent fever. Recent CBC, CT abdomen pelvis, and urine unremarkable. Recheck CBC as above.

## 2011-08-11 NOTE — Progress Notes (Signed)
Quick Note:  Pt informed, labs faxed to [facility] at [phone] ______

## 2011-08-11 NOTE — Progress Notes (Signed)
Quick Note:  Pt informed, will fax to [facility] ______

## 2011-08-18 ENCOUNTER — Other Ambulatory Visit: Payer: Medicare Other

## 2011-08-18 DIAGNOSIS — M169 Osteoarthritis of hip, unspecified: Secondary | ICD-10-CM | POA: Diagnosis not present

## 2011-08-18 DIAGNOSIS — M1710 Unilateral primary osteoarthritis, unspecified knee: Secondary | ICD-10-CM | POA: Diagnosis not present

## 2011-08-19 ENCOUNTER — Ambulatory Visit (INDEPENDENT_AMBULATORY_CARE_PROVIDER_SITE_OTHER): Payer: Medicare Other | Admitting: Family Medicine

## 2011-08-19 ENCOUNTER — Encounter: Payer: Self-pay | Admitting: Family Medicine

## 2011-08-19 VITALS — BP 130/62 | Temp 97.8°F | Wt 139.0 lb

## 2011-08-19 DIAGNOSIS — D649 Anemia, unspecified: Secondary | ICD-10-CM

## 2011-08-19 DIAGNOSIS — F329 Major depressive disorder, single episode, unspecified: Secondary | ICD-10-CM | POA: Diagnosis not present

## 2011-08-19 NOTE — Progress Notes (Signed)
  Subjective:    Patient ID: Betty Jackson, female    DOB: [birth year], 77 y.o.   MRN: [id number]  HPI   medical followup. Recent initiation Remeron for depression, sleep disturbance, and weight loss. Overall greatly improved. Sleep is improving. She's gained 4 pounds since last visit. Mood is improved. Fever crying spells. Also her anemia is correcting following recent total hip replacement. Recent hypokalemia corrected by labs. Her appetite is increased somewhat. Right abdominal pain has resolved. She did have one episode recently of chills but no fever. She was seen by orthopedists yesterday and had cortisone injection right knee.  Past Medical History  Diagnosis Date  . Arthritis   . Headache   . Hypertension   . Hyperlipidemia   . Incontinence of urine   . GERD (gastroesophageal reflux disease)   . Temporal arteritis 2011  . CAD (coronary artery disease)     Stent RCA in [facility]   Past Surgical History  Procedure Date  . Cholecystectomy   . Tonsillectomy and adenoidectomy   . Abdominal hysterectomy age 25    TAH  . Bladder suspension     reports that she has never smoked. She does not have any smokeless tobacco history on file. She reports that she drinks about one ounce of alcohol per week. She reports that she does not use illicit drugs. family history includes Cancer in her daughter and mother; Heart attack in her father; Heart disease in her other; and Hypertension in her other.  There is no history of Arthritis. No Known Allergies    Review of Systems  Constitutional: Negative for appetite change and fatigue.  Respiratory: Negative for cough and shortness of breath.   Cardiovascular: Negative for chest pain.  Gastrointestinal: Negative for abdominal pain.  Neurological: Negative for dizziness.       Objective:   Physical Exam  Constitutional: She is oriented to person, place, and time. She appears well-developed and well-nourished.  Cardiovascular: Normal  rate and regular rhythm.   Pulmonary/Chest: Effort normal and breath sounds normal. No respiratory distress. She has no wheezes. She has no rales.  Musculoskeletal: She exhibits no edema.  Neurological: She is alert and oriented to person, place, and time.  Psychiatric: She has a normal mood and affect. Her behavior is normal. Judgment and thought content normal.          Assessment & Plan:  #1 Maj. depression improving. She has improved sleep as well as increased appetite and weight gain which are all positive. Continue Remeron. Reassess one month #2 postoperative anemia improved by recent lab. Continue iron and reduce to 1 daily and recheck CBC in one month

## 2011-08-25 ENCOUNTER — Other Ambulatory Visit: Payer: Medicare Other

## 2011-09-04 ENCOUNTER — Ambulatory Visit: Payer: Medicare Other | Admitting: Family Medicine

## 2011-09-14 ENCOUNTER — Telehealth: Payer: Self-pay | Admitting: *Deleted

## 2011-09-14 ENCOUNTER — Other Ambulatory Visit: Payer: Medicare Other

## 2011-09-14 NOTE — Telephone Encounter (Signed)
Received PC from [facility] in our lab asking for clarification on sed rate (which is a future order), and Hgb, which is not ordered.  Should she order a CBC or just a finger stick Hgb?  She also needs diagnosis for CBC or Hgb.

## 2011-09-14 NOTE — Telephone Encounter (Signed)
CBC (dx anemia)

## 2011-09-18 ENCOUNTER — Ambulatory Visit (INDEPENDENT_AMBULATORY_CARE_PROVIDER_SITE_OTHER): Payer: Medicare Other | Admitting: Family Medicine

## 2011-09-18 ENCOUNTER — Encounter: Payer: Self-pay | Admitting: Family Medicine

## 2011-09-18 VITALS — BP 142/70 | Temp 98.6°F | Wt 140.0 lb

## 2011-09-18 DIAGNOSIS — M316 Other giant cell arteritis: Secondary | ICD-10-CM

## 2011-09-18 DIAGNOSIS — D649 Anemia, unspecified: Secondary | ICD-10-CM

## 2011-09-18 LAB — CBC WITH DIFFERENTIAL/PLATELET
Basophils Absolute: 0.1 10*3/uL (ref 0.0–0.1)
Basophils Relative: 0.6 % (ref 0.0–3.0)
Eosinophils Absolute: 0.1 10*3/uL (ref 0.0–0.7)
Lymphocytes Relative: 25.7 % (ref 12.0–46.0)
MCHC: 32.6 g/dL (ref 30.0–36.0)
MCV: 84.2 fl (ref 78.0–100.0)
Monocytes Absolute: 0.8 10*3/uL (ref 0.1–1.0)
Neutrophils Relative %: 64.2 % (ref 43.0–77.0)
Platelets: 211 10*3/uL (ref 150.0–400.0)
RBC: 4.11 Mil/uL (ref 3.87–5.11)
RDW: 14.8 % — ABNORMAL HIGH (ref 11.5–14.6)

## 2011-09-18 LAB — C-REACTIVE PROTEIN: CRP: 1 mg/dL (ref 1–20)

## 2011-09-18 NOTE — Progress Notes (Signed)
  Subjective:    Patient ID: Betty Jackson, female    DOB: [birth year], 77 y.o.   MRN: [id number]  HPI  Medical followup. Patient seen recently with several issues including mild hypokalemia, anemia following recent hip surgery, depression. She was placed on iron and hemoglobin was improving. She is eating much better and has gained some weight back to her baseline. Depression is improved. Sleeping better. Fever crying spells. Remains on other medications as outlined. No dizziness or weakness.  She has temporal arteritis. No recent headaches or visual changes. Followed at [facility]. Needs repeat sedimentation rate and C. reactive protein today  Past Medical History  Diagnosis Date  . Arthritis   . Headache   . Hypertension   . Hyperlipidemia   . Incontinence of urine   . GERD (gastroesophageal reflux disease)   . Temporal arteritis 2011  . CAD (coronary artery disease)     Stent RCA in [facility]   Past Surgical History  Procedure Date  . Cholecystectomy   . Tonsillectomy and adenoidectomy   . Abdominal hysterectomy age 74    TAH  . Bladder suspension   . Joint replacement 2013    R THR    reports that she has never smoked. She does not have any smokeless tobacco history on file. She reports that she drinks about one ounce of alcohol per week. She reports that she does not use illicit drugs. family history includes Cancer in her daughter and mother; Heart attack in her father; Heart disease in her other; and Hypertension in her other.  There is no history of Arthritis. No Known Allergies    Review of Systems  Constitutional: Negative for fatigue.  Eyes: Negative for visual disturbance.  Respiratory: Negative for cough, chest tightness, shortness of breath and wheezing.   Cardiovascular: Negative for chest pain, palpitations and leg swelling.  Neurological: Negative for dizziness, seizures, syncope, weakness, light-headedness and headaches.  Psychiatric/Behavioral:  Negative for confusion and dysphoric mood.       Objective:   Physical Exam  Constitutional: She is oriented to person, place, and time. She appears well-developed and well-nourished. No distress.  HENT:  Mouth/Throat: Oropharynx is clear and moist.  Neck: Neck supple. No thyromegaly present.  Cardiovascular: Normal rate and regular rhythm.   Pulmonary/Chest: Effort normal and breath sounds normal. No respiratory distress. She has no wheezes. She has no rales.  Musculoskeletal: She exhibits no edema.  Lymphadenopathy:    She has no cervical adenopathy.  Neurological: She is alert and oriented to person, place, and time.  Psychiatric: She has a normal mood and affect. Her behavior is normal.          Assessment & Plan:  #1 anemia following recent hip surgery. Repeat CBC today.  If normal stop iron  #2 recent hypokalemia. Recent correction and suspect this will be normal with increased dietary intake. She can stop potassium replacement at this time #3 depression improved.   continue Remeron and reassess 5 months and hopefully discontinue at that time

## 2011-09-21 ENCOUNTER — Other Ambulatory Visit: Payer: Self-pay | Admitting: Family Medicine

## 2011-09-22 ENCOUNTER — Telehealth: Payer: Self-pay | Admitting: *Deleted

## 2011-09-22 NOTE — Telephone Encounter (Signed)
done

## 2011-09-24 DIAGNOSIS — S72009A Fracture of unspecified part of neck of unspecified femur, initial encounter for closed fracture: Secondary | ICD-10-CM | POA: Diagnosis not present

## 2011-09-25 DIAGNOSIS — M316 Other giant cell arteritis: Secondary | ICD-10-CM | POA: Diagnosis not present

## 2011-09-25 DIAGNOSIS — Z79899 Other long term (current) drug therapy: Secondary | ICD-10-CM | POA: Diagnosis not present

## 2011-09-25 DIAGNOSIS — M069 Rheumatoid arthritis, unspecified: Secondary | ICD-10-CM | POA: Diagnosis not present

## 2011-09-29 ENCOUNTER — Encounter: Payer: Self-pay | Admitting: Family Medicine

## 2011-10-04 ENCOUNTER — Emergency Department (HOSPITAL_COMMUNITY)
Admission: EM | Admit: 2011-10-04 | Discharge: 2011-10-04 | Disposition: A | Discharge status: Home / Self Care | Payer: Medicare Other | Attending: Emergency Medicine | Admitting: Emergency Medicine

## 2011-10-04 ENCOUNTER — Encounter (HOSPITAL_COMMUNITY): Payer: Self-pay

## 2011-10-04 ENCOUNTER — Emergency Department (HOSPITAL_COMMUNITY): Payer: Medicare Other

## 2011-10-04 DIAGNOSIS — E785 Hyperlipidemia, unspecified: Secondary | ICD-10-CM | POA: Diagnosis not present

## 2011-10-04 DIAGNOSIS — M545 Low back pain, unspecified: Secondary | ICD-10-CM | POA: Diagnosis not present

## 2011-10-04 DIAGNOSIS — W19XXXA Unspecified fall, initial encounter: Secondary | ICD-10-CM

## 2011-10-04 DIAGNOSIS — M4310 Spondylolisthesis, site unspecified: Secondary | ICD-10-CM | POA: Diagnosis not present

## 2011-10-04 DIAGNOSIS — Z96649 Presence of unspecified artificial hip joint: Secondary | ICD-10-CM | POA: Diagnosis not present

## 2011-10-04 DIAGNOSIS — K219 Gastro-esophageal reflux disease without esophagitis: Secondary | ICD-10-CM | POA: Insufficient documentation

## 2011-10-04 DIAGNOSIS — Z79899 Other long term (current) drug therapy: Secondary | ICD-10-CM | POA: Insufficient documentation

## 2011-10-04 DIAGNOSIS — M549 Dorsalgia, unspecified: Secondary | ICD-10-CM | POA: Diagnosis not present

## 2011-10-04 DIAGNOSIS — I10 Essential (primary) hypertension: Secondary | ICD-10-CM | POA: Insufficient documentation

## 2011-10-04 DIAGNOSIS — IMO0002 Reserved for concepts with insufficient information to code with codable children: Secondary | ICD-10-CM | POA: Diagnosis not present

## 2011-10-04 DIAGNOSIS — I2510 Atherosclerotic heart disease of native coronary artery without angina pectoris: Secondary | ICD-10-CM | POA: Diagnosis not present

## 2011-10-04 DIAGNOSIS — W108XXA Fall (on) (from) other stairs and steps, initial encounter: Secondary | ICD-10-CM | POA: Insufficient documentation

## 2011-10-04 DIAGNOSIS — Z9181 History of falling: Secondary | ICD-10-CM | POA: Diagnosis not present

## 2011-10-04 LAB — URINALYSIS, ROUTINE W REFLEX MICROSCOPIC
Bilirubin Urine: NEGATIVE
Ketones, ur: NEGATIVE mg/dL
Nitrite: NEGATIVE
Protein, ur: NEGATIVE mg/dL
pH: 7.5 (ref 5.0–8.0)

## 2011-10-04 MED ORDER — HYDROCODONE-ACETAMINOPHEN 5-500 MG PO TABS: 1.0000 | ORAL_TABLET | Freq: Four times a day (QID) | ORAL | Status: AC | PRN

## 2011-10-04 MED ORDER — CYCLOBENZAPRINE HCL 5 MG PO TABS: 5.0000 mg | ORAL_TABLET | Freq: Three times a day (TID) | ORAL | Status: AC | PRN

## 2011-10-04 MED ORDER — OXYCODONE-ACETAMINOPHEN 5-325 MG PO TABS
1.0000 | ORAL_TABLET | Freq: Once | ORAL | Status: AC
  Administered 2011-10-04: 1 via ORAL
  Filled 2011-10-04: qty 1

## 2011-10-04 NOTE — ED Notes (Signed)
Pt in with mid back pain states tripped hitting back on stairs on Friday, states pain radiates to the lower back states sharp with difficulty walking hx of a spacer placed in the back, states nausea at times denies radiating to the legs

## 2011-10-04 NOTE — ED Provider Notes (Signed)
History     CSN: [id number]  Arrival date & time 10/04/11  1059   First MD Initiated Contact with Patient 10/04/11 1154      Chief Complaint  Patient presents with  . Fall  . Back Pain    (Consider location/radiation/quality/duration/timing/severity/associated sxs/prior treatment) HPI Pt presents with c/o hitting her right mid back on a stair banister yesterday.  Pt states she slipped and tried to hold herself up with the banister of the stairs but fell sideways and hit her right mid back.  She states pain worsened throughout the night. Does feel some radiation down her right leg.  No sob, no weakness of legs, no urinary retention or incontinence of bowel or bladder.  Pain described as moderate, sharp and aching.  Movement and palpation makes her pain worse. There are no other associated systemic symptoms, there are no other alleviating or modifying factors.   Past Medical History  Diagnosis Date  . Arthritis   . Headache   . Hypertension   . Hyperlipidemia   . Incontinence of urine   . GERD (gastroesophageal reflux disease)   . Temporal arteritis 2011  . CAD (coronary artery disease)     Stent RCA in [facility]    Past Surgical History  Procedure Date  . Cholecystectomy   . Tonsillectomy and adenoidectomy   . Abdominal hysterectomy age 53    TAH  . Bladder suspension   . Joint replacement 2013    R THR    Family History  Problem Relation Age of Onset  . Cancer Daughter     breast  . Arthritis Neg Hx   . Heart disease Other   . Hypertension Other   . Heart attack Father   . Cancer Mother     History  Substance Use Topics  . Smoking status: Never Smoker   . Smokeless tobacco: Not on file  . Alcohol Use: 1.0 oz/week    2 drink(s) per week    OB History    Grav Para Term Preterm Abortions TAB SAB Ect Mult Living                  Review of Systems ROS reviewed and all otherwise negative except for mentioned in HPI  Allergies  Review of  patient's allergies indicates no known allergies.  Home Medications   Current Outpatient Rx  Name Route Sig Dispense Refill  . ALPRAZOLAM 0.5 MG PO TABS Oral Take 0.5 mg by mouth at bedtime as needed. Sleep.    Marland Kitchen AMLODIPINE BESYLATE 5 MG PO TABS  TAKE 1 TABLET BY MOUTH EVERY DAY 30 tablet 6  . ASPIRIN 81 MG PO TABS Oral Take 81 mg by mouth 2 (two) times daily.     . ATORVASTATIN CALCIUM 20 MG PO TABS Oral Take 1 tablet (20 mg total) by mouth daily. 90 tablet 3  . CALCIUM CARBONATE 600 MG PO TABS Oral Take 600 mg by mouth daily.    Marland Kitchen VITAMIN D 1000 UNITS PO TABS Oral Take 1,000 Units by mouth daily.    Marland Kitchen FERROUS SULFATE 325 (65 FE) MG PO TABS Oral Take 325 mg by mouth daily with breakfast.    . HYDROCODONE-ACETAMINOPHEN 10-325 MG PO TABS Oral Take 1 tablet by mouth every 6 (six) hours as needed. For back pain.    Marland Kitchen LOSARTAN POTASSIUM-HCTZ 50-12.5 MG PO TABS Oral Take 1 tablet by mouth daily.      Marland Kitchen METOPROLOL TARTRATE 50 MG PO TABS  TAKE 1 TABLET BY MOUTH TWICE DAILY 60 tablet 5  . MIRTAZAPINE 15 MG PO TBDP Oral Take 15 mg by mouth at bedtime.    Marland Kitchen JUICE PLUS FIBRE PO Oral Take 1 capsule by mouth daily.    Marland Kitchen OMEPRAZOLE 20 MG PO CPDR  TAKE 1 CAPSULE BY MOUTH EVERY DAY 90 capsule 3  . PREDNISONE 1 MG PO TABS Oral Take 3 mg by mouth daily. Take 3mg  and 4mg  alternately.    . CYCLOBENZAPRINE HCL 5 MG PO TABS Oral Take 1 tablet (5 mg total) by mouth 3 (three) times daily as needed for muscle spasms. 20 tablet 0  . HYDROCODONE-ACETAMINOPHEN 5-500 MG PO TABS Oral Take 1-2 tablets by mouth every 6 (six) hours as needed for pain. 15 tablet 0    BP 148/61  Pulse 82  Temp 97.6 F (36.4 C) (Oral)  Resp 22  SpO2 100% Vitals reviewed Physical Exam Physical Examination: General appearance - alert, well appearing, and in no distress Mental status - alert, oriented to person, place, and time Eyes - no scleral icterus or conjucntival injection Mouth - mucous membranes moist, pharynx normal without  lesions Chest - clear to auscultation, no wheezes, rales or rhonchi, symmetric air entry, no chest wall tenderness Heart - normal rate, regular rhythm, normal S1, S2, no murmurs, rubs, clicks or gallops Abdomen - soft, nontender, nondistended, no masses or organomegaly Back exam -ttp over lumbar spine- but primarily over right paraspinous area, also ttp over buttock/ischium, no stepoffs or deformity Neurological - alert, oriented, normal speech, strength 5/5 in extremities x 4, sensation intact Musculoskeletal - no joint tenderness, deformity or swelling Extremities - peripheral pulses normal, no pedal edema, no clubbing or cyanosis Skin - normal coloration and turgor, no rashes, no suspicious skin lesions noted pscyh- normal mood and affect  ED Course  Procedures (including critical care time)   Labs Reviewed  URINALYSIS, ROUTINE W REFLEX MICROSCOPIC  LAB REPORT - SCANNED   Dg Lumbar Spine Complete  10/04/2011  *RADIOLOGY REPORT*  Clinical Data: Fall.  Left-sided low back pain.  LUMBAR SPINE - COMPLETE 4+ VIEW  Comparison: Bone window images from CT abdomen pelvis 07/30/2011.  Findings: Five non-rib bearing lumbar vertebrae.  Straightening of the usual lumbar lordosis.  Slight degenerative spondylolisthesis of L3 on L4 approximating 5 mm.  No fractures.  Disc space narrowing endplate hypertrophic changes at every lumbar level, worst at L1-2 and L2-3.  Prior L3-4 posteriorly, with X-stop device between the spinous processes.  No pars defects.  Multilevel facet degenerative changes.  IMPRESSION: No acute osseous abnormality.  Severe diffuse degenerative disc disease, spondylosis, and facet degenerative changes.  Degenerative grade 1 L3-4 spondylolisthesis approximating 5 mm.  Original Report Authenticated By: Arnell Sieving, M.D.   Dg Pelvis 1-2 Views  10/04/2011  *RADIOLOGY REPORT*  Clinical Data: Fall.  PELVIS - 1-2 VIEW  Comparison: Bone window images from CT pelvis 07/30/2011.  Findings:  Prior right hip arthroplasty with anatomic alignment.  No acute fractures involving the pelvis or either proximal femur. Symphysis pubis intact.  IMPRESSION: No acute osseous abnormalities.  Original Report Authenticated By: Arnell Sieving, M.D.   Dg Sacrum/coccyx  10/04/2011  *RADIOLOGY REPORT*  Clinical Data: Larey Seat.  Sacrococcygeal injury.  SACRUM AND COCCYX - 2+ VIEW  Comparison: None.  Findings: No fractures identified involving the sacrum or coccyx. Sacroiliac joints intact with mild degenerative changes.  IMPRESSION: No acute osseous abnormality.  Mild degenerative changes involving the SI joints.  Original Report  Authenticated By: Arnell Sieving, M.D.     1. Back pain   2. Fall       MDM  Pt presenting with c/o mid/lower back pain after slip and fall yesterday. No signs or symptoms of cauda equina.  xrays without acute findings.  Pt given pain meds in ED.  Dsicharged with strict return precautions.  SHe is agreable with this plan.         Ethelda Chick, MD 10/05/11 2228

## 2011-10-12 ENCOUNTER — Ambulatory Visit (INDEPENDENT_AMBULATORY_CARE_PROVIDER_SITE_OTHER): Payer: Medicare Other | Admitting: Family Medicine

## 2011-10-12 DIAGNOSIS — M546 Pain in thoracic spine: Secondary | ICD-10-CM | POA: Diagnosis not present

## 2011-10-12 DIAGNOSIS — M545 Low back pain: Secondary | ICD-10-CM

## 2011-10-12 MED ORDER — OXYCODONE HCL 5 MG PO TABS: 5.0000 mg | ORAL_TABLET | ORAL | Status: AC | PRN

## 2011-10-12 NOTE — Progress Notes (Signed)
  Subjective:    Patient ID: Betty Jackson, female    DOB: 08-28-1934, 77 y.o.   MRN: [id number]  HPI  Followup from emergency room. On 10/02/2011 she fell back against a stair banister with injury left lower lumbar/left lower thoracic region. Went to emergency room. She had multiple x-rays including lumbar spine, pelvis, coccyxz with no evidence for acute bony abnormality. She had degenerative changes lumbar spine. She has persistent pain rated 10 out of 10 severity left lower lumbar region and left posterior rib cage region. No visible bruising. No radiculopathy symptoms. No numbness or weakness. No incontinence symptoms. Prescribed hydrocodone which has not helped much.  Denies cough or fever.  Does have some back pain with deep breathing.  No dyspnea.   Review of Systems  Constitutional: Negative for chills and unexpected weight change.  Genitourinary: Negative for dysuria.  Neurological: Negative for weakness and numbness.       Objective:   Physical Exam  Constitutional: She appears well-developed and well-nourished.  Cardiovascular: Normal rate and regular rhythm.   Pulmonary/Chest: Effort normal. No respiratory distress. She has no wheezes. She has no rales.  Musculoskeletal:       No visible ecchymosis lumbar region. She has tenderness over the left posterior 11th and 12th rib region. Straight leg raise negative on the left  Neurological:       Full-strength left lower extremity. Normal sensory function.          Assessment & Plan:  Low back pain. Differential includes left posterior rib fracture versus contusion. Poor pain control with hydrocodone. Discontinue hydrocodone. Oxycodone 5 mg every 4 hours when necessary pain. Measures to reduce constipation. Offered x-rays left posterior ribs but at this point she will observe. Touch base in 2 weeks if no better

## 2011-12-07 ENCOUNTER — Encounter: Payer: Self-pay | Admitting: Family Medicine

## 2011-12-07 ENCOUNTER — Other Ambulatory Visit: Payer: Medicare Other

## 2011-12-07 ENCOUNTER — Ambulatory Visit (INDEPENDENT_AMBULATORY_CARE_PROVIDER_SITE_OTHER): Payer: Medicare Other | Admitting: Family Medicine

## 2011-12-07 VITALS — BP 130/70 | Temp 98.0°F | Wt 148.0 lb

## 2011-12-07 DIAGNOSIS — R05 Cough: Secondary | ICD-10-CM

## 2011-12-07 DIAGNOSIS — J309 Allergic rhinitis, unspecified: Secondary | ICD-10-CM

## 2011-12-07 DIAGNOSIS — I10 Essential (primary) hypertension: Secondary | ICD-10-CM | POA: Diagnosis not present

## 2011-12-07 DIAGNOSIS — M316 Other giant cell arteritis: Secondary | ICD-10-CM

## 2011-12-07 MED ORDER — FLUTICASONE PROPIONATE 50 MCG/ACT NA SUSP: 2.0000 | Freq: Every day | NASAL | Status: DC

## 2011-12-07 MED ORDER — HYDROCOD POLST-CHLORPHEN POLST 10-8 MG/5ML PO LQCR: ORAL | Status: DC

## 2011-12-07 NOTE — Progress Notes (Signed)
Subjective:    Patient ID: Betty Jackson, female    DOB: [birth year], 77 y.o.   MRN: [id number]  HPI  Patient seen for the following issues  Concerned for some allergies. Went to the beach last week and developed some nasal congestion and cough. Postnasal drip symptoms. No fever. Took some Sudafed but had insomnia. Took some Claritin with minimal relief. Symptoms are somewhat improved since returning home. She has previously taken Tussionex for cough and requesting this for nighttime use. Nonsmoker. No dyspnea.  History of depression and weight loss in recent months. After starting Remeron symptoms greatly improved. At this point her weight is back up and she is feeling very stable from a depression standpoint. She is requesting coming off Remeron at this time.  History of temporal arteritis. Vision stable. Requesting labs with sed rate and CRP. This has been followed at [facility].  Hypertension stable on losartan HCTZ and metoprolol. Also takes amlodipine. Compliant with medications. No recent dizziness.  Past Medical History  Diagnosis Date  . Arthritis   . Headache   . Hypertension   . Hyperlipidemia   . Incontinence of urine   . GERD (gastroesophageal reflux disease)   . Temporal arteritis 2011  . CAD (coronary artery disease)     Stent RCA in [facility]   Past Surgical History  Procedure Date  . Cholecystectomy   . Tonsillectomy and adenoidectomy   . Abdominal hysterectomy age 37    TAH  . Bladder suspension   . Joint replacement 2013    R THR    reports that she has never smoked. She does not have any smokeless tobacco history on file. She reports that she drinks about one ounce of alcohol per week. She reports that she does not use illicit drugs. family history includes Cancer in her daughter and mother; Heart attack in her father; Heart disease in her other; and Hypertension in her other.  There is no history of Arthritis. Allergies  Allergen Reactions  .  Tussionex Pennkinetic Er (Hydrocod Polst-Cpm Polst Er)     tussionex DM, hives      Review of Systems  Constitutional: Negative for fever, chills and fatigue.  HENT: Positive for congestion and postnasal drip.   Eyes: Negative for visual disturbance.  Respiratory: Positive for cough. Negative for chest tightness, shortness of breath and wheezing.   Cardiovascular: Negative for chest pain, palpitations and leg swelling.  Neurological: Negative for dizziness, seizures, syncope, weakness, light-headedness and headaches.  Hematological: Negative for adenopathy.       Objective:   Physical Exam  Constitutional: She appears well-developed and well-nourished.  HENT:  Right Ear: External ear normal.  Left Ear: External ear normal.  Nose: Nose normal.  Mouth/Throat: Oropharynx is clear and moist.  Neck: Neck supple.  Cardiovascular: Normal rate and regular rhythm.   Pulmonary/Chest: Effort normal and breath sounds normal. No respiratory distress. She has no wheezes. She has no rales.  Musculoskeletal: She exhibits no edema.  Lymphadenopathy:    She has no cervical adenopathy.          Assessment & Plan:  #1 allergic rhinitis. Try over-the-counter Allegra. Flonase nasal 2 sprays per nostril once daily as needed. Avoid Sudafed secondary to hypertension history #2 history of depression. Currently stable. She'll try discontinuing the Remeron at this time  #3 cough probably secondary to #1. Brief use of Tussionex 1/2 teaspoon at night as needed with caution for sedation #4 temporal arteritis. Labs today with sedimentation rate  and CRP #5 hypertension. Stable. Continue current medications

## 2011-12-07 NOTE — Patient Instructions (Addendum)
Stop Remeron at this time.

## 2011-12-08 LAB — HIGH SENSITIVITY CRP: CRP, High Sensitivity: 15.63 mg/dL — ABNORMAL HIGH (ref 0.000–5.000)

## 2011-12-09 NOTE — Progress Notes (Signed)
Quick Note:  Will fax ______

## 2011-12-20 ENCOUNTER — Other Ambulatory Visit: Payer: Self-pay | Admitting: Family Medicine

## 2011-12-21 DIAGNOSIS — M316 Other giant cell arteritis: Secondary | ICD-10-CM | POA: Diagnosis not present

## 2011-12-21 DIAGNOSIS — M069 Rheumatoid arthritis, unspecified: Secondary | ICD-10-CM | POA: Diagnosis not present

## 2011-12-22 ENCOUNTER — Other Ambulatory Visit: Payer: Self-pay | Admitting: Family Medicine

## 2011-12-24 ENCOUNTER — Encounter (HOSPITAL_BASED_OUTPATIENT_CLINIC_OR_DEPARTMENT_OTHER): Payer: Self-pay

## 2011-12-24 ENCOUNTER — Emergency Department (HOSPITAL_BASED_OUTPATIENT_CLINIC_OR_DEPARTMENT_OTHER): Payer: Medicare Other

## 2011-12-24 ENCOUNTER — Inpatient Hospital Stay (HOSPITAL_BASED_OUTPATIENT_CLINIC_OR_DEPARTMENT_OTHER)
Admission: EM | Admit: 2011-12-24 | Discharge: 2011-12-26 | DRG: 394 | Disposition: A | Discharge status: Home / Self Care | Payer: Medicare Other | Attending: Internal Medicine | Admitting: Internal Medicine

## 2011-12-24 DIAGNOSIS — D649 Anemia, unspecified: Secondary | ICD-10-CM | POA: Diagnosis present

## 2011-12-24 DIAGNOSIS — Z9861 Coronary angioplasty status: Secondary | ICD-10-CM

## 2011-12-24 DIAGNOSIS — I498 Other specified cardiac arrhythmias: Secondary | ICD-10-CM | POA: Diagnosis present

## 2011-12-24 DIAGNOSIS — K529 Noninfective gastroenteritis and colitis, unspecified: Secondary | ICD-10-CM

## 2011-12-24 DIAGNOSIS — I2510 Atherosclerotic heart disease of native coronary artery without angina pectoris: Secondary | ICD-10-CM | POA: Diagnosis not present

## 2011-12-24 DIAGNOSIS — Z79899 Other long term (current) drug therapy: Secondary | ICD-10-CM | POA: Diagnosis not present

## 2011-12-24 DIAGNOSIS — M316 Other giant cell arteritis: Secondary | ICD-10-CM | POA: Diagnosis present

## 2011-12-24 DIAGNOSIS — Z96649 Presence of unspecified artificial hip joint: Secondary | ICD-10-CM | POA: Diagnosis not present

## 2011-12-24 DIAGNOSIS — I4891 Unspecified atrial fibrillation: Secondary | ICD-10-CM | POA: Diagnosis present

## 2011-12-24 DIAGNOSIS — Z8679 Personal history of other diseases of the circulatory system: Secondary | ICD-10-CM | POA: Diagnosis not present

## 2011-12-24 DIAGNOSIS — R1084 Generalized abdominal pain: Secondary | ICD-10-CM

## 2011-12-24 DIAGNOSIS — I959 Hypotension, unspecified: Secondary | ICD-10-CM | POA: Diagnosis present

## 2011-12-24 DIAGNOSIS — Z7982 Long term (current) use of aspirin: Secondary | ICD-10-CM | POA: Diagnosis not present

## 2011-12-24 DIAGNOSIS — R1013 Epigastric pain: Secondary | ICD-10-CM | POA: Diagnosis not present

## 2011-12-24 DIAGNOSIS — K5289 Other specified noninfective gastroenteritis and colitis: Secondary | ICD-10-CM | POA: Diagnosis not present

## 2011-12-24 DIAGNOSIS — I10 Essential (primary) hypertension: Secondary | ICD-10-CM | POA: Diagnosis not present

## 2011-12-24 DIAGNOSIS — R109 Unspecified abdominal pain: Secondary | ICD-10-CM | POA: Diagnosis not present

## 2011-12-24 DIAGNOSIS — E785 Hyperlipidemia, unspecified: Secondary | ICD-10-CM | POA: Diagnosis present

## 2011-12-24 DIAGNOSIS — IMO0002 Reserved for concepts with insufficient information to code with codable children: Secondary | ICD-10-CM

## 2011-12-24 DIAGNOSIS — J40 Bronchitis, not specified as acute or chronic: Secondary | ICD-10-CM | POA: Diagnosis not present

## 2011-12-24 DIAGNOSIS — R079 Chest pain, unspecified: Secondary | ICD-10-CM | POA: Diagnosis not present

## 2011-12-24 DIAGNOSIS — A09 Infectious gastroenteritis and colitis, unspecified: Secondary | ICD-10-CM | POA: Diagnosis not present

## 2011-12-24 DIAGNOSIS — K559 Vascular disorder of intestine, unspecified: Principal | ICD-10-CM | POA: Diagnosis present

## 2011-12-24 DIAGNOSIS — R32 Unspecified urinary incontinence: Secondary | ICD-10-CM | POA: Diagnosis present

## 2011-12-24 DIAGNOSIS — K219 Gastro-esophageal reflux disease without esophagitis: Secondary | ICD-10-CM | POA: Diagnosis present

## 2011-12-24 HISTORY — DX: Scoliosis, unspecified: M41.9

## 2011-12-24 HISTORY — DX: Other diseases of vocal cords: J38.3

## 2011-12-24 HISTORY — DX: Bradycardia, unspecified: R00.1

## 2011-12-24 HISTORY — DX: Major depressive disorder, single episode, unspecified: F32.9

## 2011-12-24 HISTORY — DX: Reserved for concepts with insufficient information to code with codable children: IMO0002

## 2011-12-24 HISTORY — DX: Paroxysmal atrial fibrillation: I48.0

## 2011-12-24 HISTORY — DX: Depression, unspecified: F32.A

## 2011-12-24 LAB — URINALYSIS, ROUTINE W REFLEX MICROSCOPIC
Bilirubin Urine: NEGATIVE
Ketones, ur: 15 mg/dL — AB
Protein, ur: NEGATIVE mg/dL
Urobilinogen, UA: 0.2 mg/dL (ref 0.0–1.0)

## 2011-12-24 LAB — COMPREHENSIVE METABOLIC PANEL
ALT: 12 U/L (ref 0–35)
AST: 20 U/L (ref 0–37)
CO2: 30 mEq/L (ref 19–32)
Chloride: 98 mEq/L (ref 96–112)
Creatinine, Ser: 0.7 mg/dL (ref 0.50–1.10)
GFR calc non Af Amer: 83 mL/min — ABNORMAL LOW (ref >90)
Total Bilirubin: 0.3 mg/dL (ref 0.3–1.2)

## 2011-12-24 LAB — HEPATIC FUNCTION PANEL
Alkaline Phosphatase: 112 U/L (ref 39–117)
Total Bilirubin: 0.3 mg/dL (ref 0.3–1.2)

## 2011-12-24 LAB — CBC
HCT: 34 % — ABNORMAL LOW (ref 36.0–46.0)
Hemoglobin: 11.7 g/dL — ABNORMAL LOW (ref 12.0–15.0)
MCH: 28.1 pg (ref 26.0–34.0)
MCHC: 34.4 g/dL (ref 30.0–36.0)
MCV: 81.5 fL (ref 78.0–100.0)

## 2011-12-24 LAB — TYPE AND SCREEN
ABO/RH(D): O POS
Antibody Screen: NEGATIVE

## 2011-12-24 LAB — LIPASE, BLOOD: Lipase: 18 U/L (ref 11–59)

## 2011-12-24 LAB — ABO/RH: ABO/RH(D): O POS

## 2011-12-24 LAB — URINE MICROSCOPIC-ADD ON

## 2011-12-24 MED ORDER — PROMETHAZINE HCL 25 MG/ML IJ SOLN
12.5000 mg | Freq: Four times a day (QID) | INTRAMUSCULAR | Status: DC | PRN
  Administered 2011-12-24: 12.5 mg via INTRAVENOUS
  Filled 2011-12-24: qty 1

## 2011-12-24 MED ORDER — INFLUENZA VIRUS VACC SPLIT PF IM SUSP: 0.5000 mL | INTRAMUSCULAR | Status: DC

## 2011-12-24 MED ORDER — PREDNISONE 1 MG PO TABS
2.0000 mg | ORAL_TABLET | ORAL | Status: DC
  Administered 2011-12-24 – 2011-12-26 (×2): 2 mg via ORAL
  Filled 2011-12-24 (×2): qty 2

## 2011-12-24 MED ORDER — METRONIDAZOLE IN NACL 5-0.79 MG/ML-% IV SOLN
500.0000 mg | Freq: Three times a day (TID) | INTRAVENOUS | Status: DC
  Administered 2011-12-24 – 2011-12-25 (×3): 500 mg via INTRAVENOUS
  Filled 2011-12-24 (×5): qty 100

## 2011-12-24 MED ORDER — ALPRAZOLAM 0.5 MG PO TABS
0.5000 mg | ORAL_TABLET | Freq: Every evening | ORAL | Status: DC | PRN
  Administered 2011-12-24 – 2011-12-25 (×2): 0.5 mg via ORAL
  Filled 2011-12-24 (×2): qty 1

## 2011-12-24 MED ORDER — LOSARTAN POTASSIUM 50 MG PO TABS
50.0000 mg | ORAL_TABLET | Freq: Every day | ORAL | Status: DC
Start: 2011-12-24 — End: 2011-12-26
  Administered 2011-12-24 – 2011-12-26 (×3): 50 mg via ORAL
  Filled 2011-12-24 (×3): qty 1

## 2011-12-24 MED ORDER — PREDNISONE 1 MG PO TABS: 2.0000 mg | ORAL_TABLET | Freq: Every day | ORAL | Status: DC

## 2011-12-24 MED ORDER — ATROPINE SULFATE 0.1 MG/ML IJ SOLN
1.0000 mg | Freq: Once | INTRAMUSCULAR | Status: AC
  Administered 2011-12-24: 1 mg via INTRAVENOUS

## 2011-12-24 MED ORDER — LOSARTAN POTASSIUM-HCTZ 50-12.5 MG PO TABS: 1.0000 | ORAL_TABLET | Freq: Every day | ORAL | Status: DC

## 2011-12-24 MED ORDER — MORPHINE SULFATE 2 MG/ML IJ SOLN
1.0000 mg | INTRAMUSCULAR | Status: DC | PRN
  Administered 2011-12-24 – 2011-12-25 (×2): 1 mg via INTRAVENOUS
  Filled 2011-12-24 (×2): qty 1

## 2011-12-24 MED ORDER — HYDROCHLOROTHIAZIDE 12.5 MG PO CAPS
12.5000 mg | ORAL_CAPSULE | Freq: Every day | ORAL | Status: DC
  Administered 2011-12-24 – 2011-12-26 (×3): 12.5 mg via ORAL
  Filled 2011-12-24 (×3): qty 1

## 2011-12-24 MED ORDER — SODIUM CHLORIDE 0.9 % IJ SOLN
3.0000 mL | Freq: Two times a day (BID) | INTRAMUSCULAR | Status: DC
  Administered 2011-12-25: 3 mL via INTRAVENOUS

## 2011-12-24 MED ORDER — HYDROMORPHONE HCL PF 1 MG/ML IJ SOLN
1.0000 mg | Freq: Once | INTRAMUSCULAR | Status: AC
  Administered 2011-12-24: 1 mg via INTRAVENOUS
  Filled 2011-12-24: qty 1

## 2011-12-24 MED ORDER — ALUM & MAG HYDROXIDE-SIMETH 200-200-20 MG/5ML PO SUSP
30.0000 mL | Freq: Once | ORAL | Status: AC
  Administered 2011-12-24: 30 mL via ORAL
  Filled 2011-12-24: qty 30

## 2011-12-24 MED ORDER — PNEUMOCOCCAL VAC POLYVALENT 25 MCG/0.5ML IJ INJ: 0.5000 mL | INJECTION | INTRAMUSCULAR | Status: DC

## 2011-12-24 MED ORDER — ACETAMINOPHEN 325 MG PO TABS
650.0000 mg | ORAL_TABLET | Freq: Four times a day (QID) | ORAL | Status: DC | PRN
Start: 2011-12-24 — End: 2011-12-26

## 2011-12-24 MED ORDER — PROMETHAZINE HCL 25 MG/ML IJ SOLN
12.5000 mg | Freq: Once | INTRAMUSCULAR | Status: AC
  Administered 2011-12-24: 12.5 mg via INTRAVENOUS
  Filled 2011-12-24: qty 1

## 2011-12-24 MED ORDER — PREDNISONE 1 MG PO TABS
3.0000 mg | ORAL_TABLET | ORAL | Status: DC
  Administered 2011-12-25: 3 mg via ORAL
  Filled 2011-12-24: qty 3

## 2011-12-24 MED ORDER — SODIUM CHLORIDE 0.9 % IV SOLN
INTRAVENOUS | Status: DC
  Administered 2011-12-24: 15:00:00 via INTRAVENOUS

## 2011-12-24 MED ORDER — MORPHINE SULFATE 2 MG/ML IJ SOLN
2.0000 mg | INTRAMUSCULAR | Status: DC | PRN
  Administered 2011-12-24: 2 mg via INTRAVENOUS
  Filled 2011-12-24: qty 1

## 2011-12-24 MED ORDER — FENTANYL CITRATE 0.05 MG/ML IJ SOLN
50.0000 ug | Freq: Once | INTRAMUSCULAR | Status: AC
  Administered 2011-12-24: 50 ug via INTRAVENOUS
  Filled 2011-12-24: qty 2

## 2011-12-24 MED ORDER — ONDANSETRON HCL 4 MG/2ML IJ SOLN
4.0000 mg | Freq: Four times a day (QID) | INTRAMUSCULAR | Status: DC | PRN
  Administered 2011-12-24: 4 mg via INTRAVENOUS
  Filled 2011-12-24: qty 2

## 2011-12-24 MED ORDER — SODIUM CHLORIDE 0.9 % IV BOLUS (SEPSIS)
1000.0000 mL | Freq: Once | INTRAVENOUS | Status: AC
  Administered 2011-12-24: 1000 mL via INTRAVENOUS

## 2011-12-24 MED ORDER — ONDANSETRON HCL 4 MG/2ML IJ SOLN
4.0000 mg | Freq: Once | INTRAMUSCULAR | Status: AC
  Administered 2011-12-24: 4 mg via INTRAVENOUS
  Filled 2011-12-24: qty 2

## 2011-12-24 MED ORDER — PANTOPRAZOLE SODIUM 40 MG IV SOLR
40.0000 mg | INTRAVENOUS | Status: DC
  Administered 2011-12-24: 40 mg via INTRAVENOUS
  Filled 2011-12-24 (×2): qty 40

## 2011-12-24 MED ORDER — IOHEXOL 300 MG/ML  SOLN
100.0000 mL | Freq: Once | INTRAMUSCULAR | Status: AC | PRN
  Administered 2011-12-24: 100 mL via INTRAVENOUS

## 2011-12-24 MED ORDER — CIPROFLOXACIN IN D5W 400 MG/200ML IV SOLN
400.0000 mg | Freq: Two times a day (BID) | INTRAVENOUS | Status: DC
  Administered 2011-12-24 – 2011-12-25 (×2): 400 mg via INTRAVENOUS
  Filled 2011-12-24 (×3): qty 200

## 2011-12-24 MED ORDER — ACETAMINOPHEN 650 MG RE SUPP: 650.0000 mg | Freq: Four times a day (QID) | RECTAL | Status: DC | PRN

## 2011-12-24 NOTE — ED Provider Notes (Signed)
History     CSN: [id number]  Arrival date & time 12/24/11  0715   First MD Initiated Contact with Patient 12/24/11 [phone]      Chief Complaint  Patient presents with  . Chest Pain  . Abdominal Pain  . Emesis    (Consider location/radiation/quality/duration/timing/severity/associated sxs/prior treatment) The history is provided by the patient and a relative. No language interpreter was used.   history obtained per daughter's and the patient herself.   Betty Jackson is a 77 y.o. female with a past medical history 1 stent placed in [place] in 2005 who follows locally with Dr. Wilmon Arms and did have a subsequent catheterization about 5 years ago which verified a patent left RCA stent presents today with 2 days history of epigastric pain also associated with pain underneath her ribs on the right and left sides associated with 78 occurrences of vomiting since about 03 100 this morning. Pain radiates up to the chest and into the abdomen. She does have a history of GERD is exacerbated by her prednisone, she takes prednisone for temporal arteritis. Patient is currently taking omeprazole to help prevent ulcers while she continues to take prednisone. Currently the pain in the abdomen is about 10/10, burning.  She denies any diaphoresis, diarrhea, fever or chills, productive cough, dysuria, frequency, muscles aches, rash.  No history of thyroid disease.  Past Medical History  Diagnosis Date  . Arthritis   . Headache   . Hypertension   . Hyperlipidemia   . Incontinence of urine   . GERD (gastroesophageal reflux disease)   . Temporal arteritis 2011  . CAD (coronary artery disease)     Stent RCA in [facility]    Past Surgical History  Procedure Date  . Cholecystectomy   . Tonsillectomy and adenoidectomy   . Abdominal hysterectomy age 46    TAH  . Bladder suspension   . Joint replacement 2013    R THR  . Coronary angioplasty with stent placement     Family History  Problem  Relation Age of Onset  . Cancer Daughter     breast  . Arthritis Neg Hx   . Heart disease Other   . Hypertension Other   . Heart attack Father   . Cancer Mother     History  Substance Use Topics  . Smoking status: Never Smoker   . Smokeless tobacco: Not on file  . Alcohol Use: 1.0 oz/week    2 drink(s) per week     rarely    OB History    Grav Para Term Preterm Abortions TAB SAB Ect Mult Living                  Review of Systems At least 10pt or greater review of systems completed and are negative except where specified in the HPI.  Allergies  Tussionex pennkinetic er  Home Medications   Current Outpatient Rx  Name Route Sig Dispense Refill  . ALPRAZOLAM 0.5 MG PO TABS Oral Take 0.5 mg by mouth at bedtime as needed. Sleep.    Marland Kitchen AMLODIPINE BESYLATE 5 MG PO TABS  TAKE 1 TABLET BY MOUTH EVERY DAY 30 tablet 2  . ASPIRIN 81 MG PO TABS Oral Take 81 mg by mouth 2 (two) times daily.     . ATORVASTATIN CALCIUM 20 MG PO TABS Oral Take 1 tablet (20 mg total) by mouth daily. 90 tablet 3  . CALCIUM CARBONATE 600 MG PO TABS Oral Take 600 mg by  mouth daily.    Marland Kitchen HYDROCOD POLST-CPM POLST ER 10-8 MG/5ML PO LQCR  One half to one teaspoon every 12 hours as needed for cough 120 mL 0  . VITAMIN D 1000 UNITS PO TABS Oral Take 1,000 Units by mouth daily.    Marland Kitchen FERROUS SULFATE 325 (65 FE) MG PO TABS Oral Take 325 mg by mouth daily with breakfast.    . FLUTICASONE PROPIONATE 50 MCG/ACT NA SUSP Nasal Place 2 sprays into the nose daily. 16 g 6  . LOSARTAN POTASSIUM-HCTZ 50-12.5 MG PO TABS Oral Take 1 tablet by mouth daily.      Marland Kitchen METOPROLOL TARTRATE 50 MG PO TABS  TAKE 1 TABLET BY MOUTH TWICE DAILY 60 tablet 5  . JUICE PLUS FIBRE PO Oral Take 1 capsule by mouth daily.    Marland Kitchen OMEPRAZOLE 20 MG PO CPDR  TAKE 1 CAPSULE BY MOUTH EVERY DAY 90 capsule 3  . PREDNISONE 1 MG PO TABS Oral Take 3 mg by mouth daily. Take 3mg  and 4mg  alternately.      BP 160/64  Pulse 74  Temp 97.4 F (36.3 C) (Oral)   Resp 20  Ht 5\' 4"  (1.626 m)  Wt 140 lb (63.504 kg)  BMI 24.03 kg/m2  SpO2 99%  Physical Exam  Nursing notes reviewed.  Electronic medical record reviewed. VITAL SIGNS:   Filed Vitals:   12/24/11 0936 12/24/11 1049 12/24/11 1116 12/24/11 1245  BP: 141/58 138/55  166/69  Pulse: 58 65  54  Temp:    97.6 F (36.4 C)  TempSrc:      Resp: 16 16  18   Height:      Weight:      SpO2: 100% 94% 95% 99%   CONSTITUTIONAL: Awake, oriented, appears non-toxic HENT: Atraumatic, normocephalic, oral mucosa pink and moist, airway patent. Nares patent without drainage. External ears normal. EYES: Conjunctiva clear, EOMI, PERRLA NECK: Trachea midline, non-tender, supple CARDIOVASCULAR: Bradycardic heart rate, Normal rhythm, No murmurs, rubs, gallops PULMONARY/CHEST: Clear to auscultation, no rhonchi, wheezes, or rales. Symmetrical breath sounds. Non-tender. ABDOMINAL: Non-distended, soft, tender to palpation in the epigastrium with voluntary guarding.  BS normal. NEUROLOGIC: Non-focal, moving all four extremities, no gross sensory or motor deficits. EXTREMITIES: No clubbing, cyanosis, or edema SKIN: Warm, Dry, No erythema, No rash  ED Course  CRITICAL CARE Performed by: Jones Skene Authorized by: Jones Skene Total critical care time: 30 minutes Critical care time was exclusive of separately billable procedures and treating other patients. Critical care was necessary to treat or prevent imminent or life-threatening deterioration of the following conditions: circulatory failure. Critical care was time spent personally by me on the following activities: discussions with consultants, evaluation of patient's response to treatment, examination of patient, ordering and performing treatments and interventions, obtaining history from patient or surrogate, interpretation of cardiac output measurements, ordering and review of radiographic studies, re-evaluation of patient's condition, pulse oximetry,  ordering and review of laboratory studies and review of old charts.   (including critical care time)   Date: 12/24/2011  Rate: 40  Rhythm: Sinus bradycardia  QRS Axis: normal  Intervals: Prolonged PR interval  ST/T Wave abnormalities: normal  Conduction Disutrbances: none  Narrative Interpretation: unremarkable - sinus bradycardia with NO ST or T wave abnormality suggestive of ischemia or infarction   Labs Reviewed  COMPREHENSIVE METABOLIC PANEL - Abnormal; Notable for the following:    Glucose, Bld 121 (*)     GFR calc non Af Amer 83 (*)     All  other components within normal limits  CBC - Abnormal; Notable for the following:    Hemoglobin 11.7 (*)     HCT 34.0 (*)     All other components within normal limits  MAGNESIUM  TROPONIN I  HEPATIC FUNCTION PANEL  LIPASE, BLOOD  URINALYSIS, ROUTINE W REFLEX MICROSCOPIC   Dg Chest Port 1 View  12/24/2011  *RADIOLOGY REPORT*  Clinical Data: Chest pain, abdominal pain, vomiting, history hypertension, coronary artery disease post PTCA and stenting  PORTABLE CHEST - 1 VIEW  Comparison: Portable exam 0810 hours without priors for comparison.  Findings: Normal heart size, mediastinal contours, and pulmonary vascularity. Minimal peribronchial thickening. No pulmonary infiltrate, pleural effusion or pneumothorax. Mild atherosclerotic calcification at aortic arch. Left glenohumeral degenerative changes.  IMPRESSION: Minimal bronchitic changes.   Original Report Authenticated By: Lollie Marrow, M.D.    No diagnosis found.    MDM  Betty Jackson is a 77 y.o. female presenting with symptomatic bradycardia and epigastric pain. Patient's heart rate returned to the 70s to 80s and she became asymptomatic after atropine - however her epigastric pain persisted. DSentinel brought her pain down to a 7-8/10 from 10.  We kept the pacing/defibrillation pads on the patient to the length of her ER stay. D  ifferential diagnosis for her bradycardia includes not  limited to medications however seen at this seems much less likely since she did not take her medications this morning. Patient did become symptomatic, and she's not an athlete, so I do not think this is a normal variant for her. EKG shows a sinus bradycardia with a first-degree block however is no other evidence for second-degree or third-degree block. Patient has a history of thyroid disease I do not think it is hypothyroidism especially with her thin body habitus. Patient's body temperature is normal, she is symptomatic this is does not resolve so do not think it's a vasovagal response.  Chief among the differential diagnosis would be myocardial ischemia involving the right coronary artery, she did have the RCA stented in 2005 in [place]. Patient's daughter say she did not have any EKG changes at the time, but did have a significant stenosis at the time which was stented. In addition patient may have sick sinus syndrome causing her bradycardia. These 2 concerns are for most and thinking that the patient needs to be admitted to the hospital for cardiovascular monitoring.  Patient does have a history of GERD, she is on prednisone chronically, she may have a thick ulcer that is causing this pain, this pain may also relating to symptomatic vagal episode.  Patient also had mentioned that she ate some food that did not agree with her and she did have a cocktail last night, and she typically does not drink alcohol at all. It could be that there are a bunch of conspiring factors for gastritis-however I do not feel this patient is a safe discharge home. We will transfer the patient to [facility] for further diagnostics and testing.   I discussed patient with Dr. Tenny Craw, cardiologist, accepts the patient.       Jones Skene, MD 12/24/11 1455

## 2011-12-24 NOTE — ED Notes (Signed)
Report called to [facility] RN.  Awaiting transport.

## 2011-12-24 NOTE — Progress Notes (Signed)
Utilization review completed.  

## 2011-12-24 NOTE — H&P (Signed)
[facility]          History and Physical    PCP:   Kristian Covey, MD   Chief Complaint:  Abdominal pain and vomiting  HPI: Ms.Betty Jackson is a 77 year old female with history of hypertension, dyslipidemia, CAD, temporal arteritis on prednisone was in her usual state of health until around 4 AM this morning. She recalls eating sausage yesterday night, then reports waking up with severe peri umbilical abdominal pain, associated with nausea and vomiting. She vomited 7 or 8 times this morning, and the vomitus contained black material, denies any bright or maroon blood. Subsequently presented to her the med center at [facility], she was given IV fentanyl and dilaudid, subsequently became bradycardic and briefly hypotensive, was then given 1 mg of atropine, and improvement in her heart rate and blood pressure. Also Had a CT of her abdomen pelvis done which showed evidence of enteritis involving the distal small bowel.  She was originally transferred to the cardiology service, subsequently admitted to Hospitalists service since her issues are primarily GI.    Allergies:   Allergies  Allergen Reactions  . Tussionex Pennkinetic Er (Hydrocod Polst-Cpm Polst Er) Hives    tussionex DM, hives      Past Medical History  Diagnosis Date  . Arthritis     a. s/p Right THA 06/2011  . Headache   . Hypertension   . Hyperlipidemia   . Incontinence of urine   . GERD (gastroesophageal reflux disease)   . Temporal arteritis     a. followed @ [facility]  . CAD (coronary artery disease)     a. Stent RCA in [facility];  b. Cath approx 2009 - nonobs per pt report.  . Sinus bradycardia     a. on chronic bb  . PAF (paroxysmal atrial fibrillation)     a. lone epidode in 2011 according to notes.    Past Surgical History  Procedure Date  . Cholecystectomy   . Tonsillectomy and adenoidectomy   . Abdominal hysterectomy age 54    TAH  . Bladder suspension   . Joint replacement 2013      R THR 06/2011  . Coronary angioplasty with stent placement     Prior to Admission medications   Medication Sig Start Date End Date Taking? Authorizing Provider  ALPRAZolam Prudy Feeler) 0.5 MG tablet Take 0.5 mg by mouth at bedtime as needed. Sleep.   Yes Historical Provider, MD  amLODipine (NORVASC) 5 MG tablet Take 5 mg by mouth daily.   Yes Historical Provider, MD  aspirin 81 MG tablet Take 81 mg by mouth 2 (two) times daily.    Yes Historical Provider, MD  atorvastatin (LIPITOR) 20 MG tablet Take 20 mg by mouth daily. 08/07/11  Yes Kristian Covey, MD  calcium carbonate (OS-CAL) 600 MG TABS Take 600 mg by mouth daily.   Yes Historical Provider, MD  cholecalciferol (VITAMIN D) 1000 UNITS tablet Take 1,000 Units by mouth 2 (two) times daily.    Yes Historical Provider, MD  fluticasone (FLONASE) 50 MCG/ACT nasal spray Place 2 sprays into the nose daily as needed. For allergires   Yes Historical Provider, MD  losartan-hydrochlorothiazide (HYZAAR) 50-12.5 MG per tablet Take 1 tablet by mouth daily.     Yes Historical Provider, MD  metoprolol (LOPRESSOR) 50 MG tablet  07/10/11  Yes Kristian Covey, MD  Nutritional Supplements (JUICE PLUS FIBRE PO) Take 1 capsule by mouth daily.   Yes Historical Provider, MD  omeprazole (PRILOSEC)  20 MG capsule TAKE 1 CAPSULE BY MOUTH EVERY DAY 06/11/11  Yes Kristian Covey, MD  predniSONE (DELTASONE) 1 MG tablet Take 2-3 mg by mouth daily. For 1 month starting on sept 23 2013   (2mg  on even days  3mg  on odd days)   Yes Historical Provider, MD  vitamin C (ASCORBIC ACID) 500 MG tablet Take 1,000 mg by mouth daily as needed. For sore throat   Yes Historical Provider, MD    Social History:  reports that she has never smoked. She does not have any smokeless tobacco history on file. She reports that she drinks about one ounce of alcohol per week. She reports that she does not use illicit drugs.  Family History  Problem Relation Age of Onset  . Cancer Daughter      breast  . Arthritis Neg Hx   . Heart disease Other   . Hypertension Other   . Heart attack Father   . Cancer Mother     Review of Systems:  Constitutional: Denies fever, chills, diaphoresis, appetite change and fatigue.  HEENT: Denies photophobia, eye pain, redness, hearing loss, ear pain, congestion, sore throat, rhinorrhea, sneezing, mouth sores, trouble swallowing, neck pain, neck stiffness and tinnitus.   Respiratory: Denies SOB, DOE, cough, chest tightness,  and wheezing.   Cardiovascular: Denies chest pain, palpitations and leg swelling.  Gastrointestinal: Denies nausea, vomiting, abdominal pain, diarrhea, constipation, blood in stool and abdominal distention.  Genitourinary: Denies dysuria, urgency, frequency, hematuria, flank pain and difficulty urinating.  Musculoskeletal: Denies myalgias, back pain, joint swelling, arthralgias and gait problem.  Skin: Denies pallor, rash and wound.  Neurological: Denies dizziness, seizures, syncope, weakness, light-headedness, numbness and headaches.  Hematological: Denies adenopathy. Easy bruising, personal or family bleeding history  Psychiatric/Behavioral: Denies suicidal ideation, mood changes, confusion, nervousness, sleep disturbance and agitation   Physical Exam: Blood pressure 166/69, pulse 54, temperature 97.6 F (36.4 C), temperature source Oral, resp. rate 18, height 5\' 4"  (1.626 m), weight 63.504 kg (140 lb), SpO2 99.00%. Gen: Alert awake oriented x3 no acute distress HEENT pupils equal reactive to light oral mucosa dry, no JVD CVS S1-S2 regular rate rhythm no murmurs rubs or gallops Lungs clear auscultation bilaterally Abdomen soft slightly distended, minimal peri-umbilical tenderness no rigidity no rebound, bowel sounds present but diminished  Extremities trace edema no clubbing or cyanosis Neuro moves all extremities no localizing signs Skin no evidence of rashes or skin breakdown   Labs on Admission:  Results for orders  placed during the hospital encounter of 12/24/11 (from the past 48 hour(s))  COMPREHENSIVE METABOLIC PANEL     Status: Abnormal   Collection Time   12/24/11  7:33 AM      Component Value Range Comment   Sodium 139  135 - 145 mEq/L    Potassium 3.7  3.5 - 5.1 mEq/L    Chloride 98  96 - 112 mEq/L    CO2 30  19 - 32 mEq/L    Glucose, Bld 121 (*) 70 - 99 mg/dL    BUN 15  6 - 23 mg/dL    Creatinine, Ser 1.61  0.50 - 1.10 mg/dL    Calcium 9.8  8.4 - 09.6 mg/dL    Total Protein 7.6  6.0 - 8.3 g/dL    Albumin 4.0  3.5 - 5.2 g/dL    AST 20  0 - 37 U/L    ALT 12  0 - 35 U/L    Alkaline Phosphatase 112  39 -  117 U/L    Total Bilirubin 0.3  0.3 - 1.2 mg/dL    GFR calc non Af Amer 83 (*) >90 mL/min    GFR calc Af Amer >90  >90 mL/min   MAGNESIUM     Status: Normal   Collection Time   12/24/11  7:33 AM      Component Value Range Comment   Magnesium 1.8  1.5 - 2.5 mg/dL   TROPONIN I     Status: Normal   Collection Time   12/24/11  7:33 AM      Component Value Range Comment   Troponin I <0.30  <0.30 ng/mL   CBC     Status: Abnormal   Collection Time   12/24/11  7:33 AM      Component Value Range Comment   WBC 9.1  4.0 - 10.5 K/uL    RBC 4.17  3.87 - 5.11 MIL/uL    Hemoglobin 11.7 (*) 12.0 - 15.0 g/dL    HCT 45.4 (*) 09.8 - 46.0 %    MCV 81.5  78.0 - 100.0 fL    MCH 28.1  26.0 - 34.0 pg    MCHC 34.4  30.0 - 36.0 g/dL    RDW 11.9  14.7 - 82.9 %    Platelets 238  150 - 400 K/uL   HEPATIC FUNCTION PANEL     Status: Normal   Collection Time   12/24/11  7:43 AM      Component Value Range Comment   Total Protein 7.6  6.0 - 8.3 g/dL    Albumin 4.0  3.5 - 5.2 g/dL    AST 20  0 - 37 U/L    ALT 13  0 - 35 U/L    Alkaline Phosphatase 112  39 - 117 U/L    Total Bilirubin 0.3  0.3 - 1.2 mg/dL    Bilirubin, Direct <5.6  0.0 - 0.3 mg/dL RESULT REPEATED AND VERIFIED   Indirect Bilirubin NOT CALCULATED  0.3 - 0.9 mg/dL   LIPASE, BLOOD     Status: Normal   Collection Time   12/24/11  7:43 AM       Component Value Range Comment   Lipase 18  11 - 59 U/L     Radiological Exams on Admission: Ct Abdomen Pelvis W Contrast  12/24/2011  *RADIOLOGY REPORT*  Clinical Data: Left-sided abdominal pain.  Nausea and vomiting.  CT ABDOMEN AND PELVIS WITH CONTRAST  Technique:  Multidetector CT imaging of the abdomen and pelvis was performed following the standard protocol during bolus administration of intravenous contrast.  Contrast: OMNIPAQUE IOHEXOL 300 MG/ML  SOLN  Comparison: 07/30/2011  Findings: Moderate wall thickening and mesenteric inflammatory changes are seen involving the distal small bowel loops in the lower abdomen and pelvis.  There is no evidence of bowel obstruction. Proximal mesenteric vessels appear patent.  Normal appendix is visualized.  Mild sigmoid diverticulosis is demonstrated, however there is no evidence of diverticulitis.  Minimal ascites seen in both upper quadrants.  Prior cholecystectomy noted.  The liver, pancreas, adrenal glands, and kidneys are normal appearance.  No evidence of hydronephrosis.  No soft tissue masses or lymphadenopathy identified.  Right hip prosthesis noted.  IMPRESSION:  1.  Findings consistent with acute enteritis involving the distal small bowel loops in the lower abdomen and pelvis.  Differential diagnosis includes infectious etiologies, inflammatory bowel disease, hemorrhage, vasculitis, and bowel ischemia. 2.  No evidence of bowel obstruction or abscess.   Original Report Authenticated By:  Danae Orleans, M.D.    Dg Chest Port 1 View  12/24/2011  *RADIOLOGY REPORT*  Clinical Data: Chest pain, abdominal pain, vomiting, history hypertension, coronary artery disease post PTCA and stenting  PORTABLE CHEST - 1 VIEW  Comparison: Portable exam 0810 hours without priors for comparison.  Findings: Normal heart size, mediastinal contours, and pulmonary vascularity. Minimal peribronchial thickening. No pulmonary infiltrate, pleural effusion or pneumothorax. Mild  atherosclerotic calcification at aortic arch. Left glenohumeral degenerative changes.  IMPRESSION: Minimal bronchitic changes.   Original Report Authenticated By: Lollie Marrow, M.D.     Assessment/Plan  1. Enteritis: Etiology unclear at this point, could be infectious versus ischemic versus inflammatory Supportive care with IV fluids, antiemetics, Bowel rest, keep n.p.o. except sips  check lactic acid level Empiric antibiotics, ciprofloxacin and Flagyl Protonix 40 mg IV every 24 Will Request GI input  2. Vomitus with dark/black material, unclear if this was hemetemesis Secondary to 1, vs ? Mallory Weis tears IV PPI Hemoglobin stable Type and cross  3. H/o Temporal arteritis: continue home dose of prednisone, if BP become soft or unstable, will add stress dose steroids  4. Bradycardia/hypotension: secondary to high vagal tone in setting of 1, and worsened by IV narcotics Improved now, hold metoprolol for now, slowly resume as BP and HR tolerate Monitor on Tele  5. HTN: resume Losartan  6. H/o CAD: stable, ASA/Metoprolol on hold  7. DVT prophylaxis: SCDS   Time Spent on Admission: Eeva Schlosser [facility] Pager: [phone] 12/24/2011, 1:50 PM

## 2011-12-24 NOTE — Consult Note (Signed)
[facility] Consult: 1:57 PM 12/24/2011   Referring Provider: Zannie Cove  Primary Care Physician:  Kristian Covey, MD Primary Gastroenterologist:     Reason for Consultation:  enteritis  HPI: Betty Jackson is a 77 y.o. female.  Hx of cardiac stent in 2006, chronic low dose prednisone for temporal arteritis. Admitted by cardiology this AM, presenting with abd pain, n/v.    The pt ate overcooked, black charred sausage Wednesday PM.  Awakened 4 AM with acute severe upper abd pain and nausea, vomiting. The emesis was of partially digested dinner and contained some flecks of black material, possibly charred food remnants or possibly cofee grounds. In [place] ED Bradycardia in 40s (rate is in 50s at baseline) was treated with Atropine. Subsequent CT abdomen showed distal SB enteritis and small amount of upper abdominal ascites.  No leukocytosis, LFTs normal. She has been started on Cipro and Flagyl and transferred to hospitalist service.  Pin is relieved with Morphine and Fentanyl.  Last BM was normal yesterday AM  Note Dr Lucie Leather office notes of 5/8 and 5/13, ER visit of 07/30/11 wherein pt evaluated for >  4 weeks right, lower abdominal pain, new onset fever without change in bowel habits. She was 1 month post 06/2011 hip surgery.  She had about 15# weight loss, but has since regained weight. CT scan 5/2 showed mildly increased caliber of intra/extra hepatic ducts, 8.7 mm CBD, periportal edema.  The small bowel and colon were normal.  Hgb was 9.2 but subsequently improved to 11.4. Remeron was added for insomnia/depression. The pain in 07/2011 has resolved.  It was nothing like the pain she had this AM.  She was told it may be from a kink in the bowels following the right hip surgery.   Denies heartburn, dysphagia, constipation, BPR/melena.  Takes Omeprazole as prophylactic given her chronic Prednisone.    ENDOSCOPIC STUDIES: 2009 Colonoscopy  in [place], [place].  Possibly had polyps then or on prior scope, but she is not certain. Remote work up for vocal cord dysfx, had scopes which may have included and EGD  Past Medical History  Diagnosis Date  . Arthritis     a. s/p Right THA 06/2011  . Headache   . Hypertension   . Hyperlipidemia   . Incontinence of urine   . GERD (gastroesophageal reflux disease)   . Temporal arteritis     a. followed @ [facility]  . CAD (coronary artery disease)     a. Stent RCA in [facility];  b. Cath approx 2009 - nonobs per pt report.  . Sinus bradycardia     a. on chronic bb  . PAF (paroxysmal atrial fibrillation)     a. lone epidode in 2011 according to notes.  . Depression   . Shortness of breath   . Anemia     Past Surgical History  Procedure Date  . Cholecystectomy   . Tonsillectomy and adenoidectomy   . Abdominal hysterectomy age 55    TAH  . Bladder suspension   . Joint replacement 2013    R THR 06/2011  . Coronary angioplasty with stent placement     Prior to Admission medications   Medication Sig Start Date End Date Taking? Authorizing Provider  ALPRAZolam Prudy Feeler) 0.5 MG tablet Take 0.5 mg by mouth at bedtime as needed. Sleep.   Yes Historical Provider, MD  amLODipine (NORVASC) 5 MG tablet Take 5 mg by mouth daily.   Yes Historical Provider, MD  aspirin 81 MG  tablet Take 81 mg by mouth 2 (two) times daily.    Yes Historical Provider, MD  atorvastatin (LIPITOR) 20 MG tablet Take 20 mg by mouth daily. 08/07/11  Yes Kristian Covey, MD  calcium carbonate (OS-CAL) 600 MG TABS Take 600 mg by mouth daily.   Yes Historical Provider, MD  cholecalciferol (VITAMIN D) 1000 UNITS tablet Take 1,000 Units by mouth 2 (two) times daily.    Yes Historical Provider, MD  fluticasone (FLONASE) 50 MCG/ACT nasal spray Place 2 sprays into the nose daily as needed. For allergires   Yes Historical Provider, MD  losartan-hydrochlorothiazide (HYZAAR) 50-12.5 MG per tablet Take 1 tablet by mouth daily.      Yes Historical Provider, MD  metoprolol (LOPRESSOR) 50 MG tablet  07/10/11  Yes Kristian Covey, MD  Nutritional Supplements (JUICE PLUS FIBRE PO) Take 1 capsule by mouth daily.   Yes Historical Provider, MD  omeprazole (PRILOSEC) 20 MG capsule TAKE 1 CAPSULE BY MOUTH EVERY DAY 06/11/11  Yes Kristian Covey, MD  predniSONE (DELTASONE) 1 MG tablet Take 2-3 mg by mouth daily. For 1 month starting on sept 23 2013   (2mg  on even days  3mg  on odd days)   Yes Historical Provider, MD  vitamin C (ASCORBIC ACID) 500 MG tablet Take 1,000 mg by mouth daily as needed. For sore throat   Yes Historical Provider, MD    Scheduled Meds:    . alum & mag hydroxide-simeth  30 mL Oral Once  . atropine  1 mg Intravenous Once  . fentaNYL  50 mcg Intravenous Once  . HYDROmorphone  1 mg Intravenous Once  . influenza  inactive virus vaccine  0.5 mL Intramuscular Tomorrow-1000  . ondansetron (ZOFRAN) IV  4 mg Intravenous Once  . promethazine  12.5 mg Intravenous Once  . sodium chloride  1,000 mL Intravenous Once   Infusions:   PRN Meds: iohexol, morphine injection   Allergies as of 12/24/2011 - Review Complete 12/24/2011  Allergen Reaction Noted  . Tussionex pennkinetic er (hydrocod polst-cpm polst er) Hives 12/07/2011    Family History  Problem Relation Age of Onset  . Cancer Daughter     breast  . Arthritis Neg Hx   . Heart disease Other   . Hypertension Other   . Heart attack Father   . Cancer Mother     History   Social History  . Marital Status: Married    Spouse Name: N/A    Number of Children: 3  . Years of Education: N/A   Occupational History  . Retired Veterinary surgeon    Social History Main Topics  . Smoking status: Never Smoker   . Smokeless tobacco: Never Used  . Alcohol Use: 1.0 oz/week    2 drink(s) per week     rarely  . Drug Use: No  . Sexually Active: No   Other Topics Concern  . Not on file   Social History Narrative   Lives in [place] with her husband.  Retired  Veterinary surgeon.    REVIEW OF SYSTEMS: No visual disturbance, no headaches. No cough or SOB No chest pain, palpitations.  chonic vocal hoarseness.  No dizziness No melena or blood per rectum.  No previous transfusions of blood product.  No dysuria or hematuria. Does have urinary incontinence.   No skin sores, rashes or itching.  Weight has stabilized after post THR losses and subsequent gain. No significant joint pain.  Swelling in her fingers.  Rare Ibuprofen:  Took 2  for a head ache a few days ago.  Rarely drinks wine.   PHYSICAL EXAM: Vital signs in last 24 hours: Temp:  [97.4 F (36.3 C)-97.6 F (36.4 C)] 97.6 F (36.4 C) (09/26 1245) Pulse Rate:  [39-74] 54  (09/26 1245) Resp:  [16-20] 18  (09/26 1245) BP: (103-166)/(49-69) 166/69 mmHg (09/26 1245) SpO2:  [94 %-100 %] 99 % (09/26 1245) Weight:  [140 lb (63.504 kg)] 140 lb (63.504 kg) (09/26 0721)  General: elderly WF.  Looks well Head:  No asymmetry or cushingoid faces  Eyes:  No icterus or pallor Ears:  Not HOH  Nose:  No discharge or congestion Mouth:  Many missing lower teeth.  Remaining teeth are in good repair.  Neck:  No mass, no JVD.  No bruits Lungs:  Clear B.  No labored breathing.  Slightly hoarse voice.  Heart: RRR.  No MRG Abdomen:  Soft, tender without guard or rebound in left mid abdomen.  No bruits, masses, HSM. BS hypoactive, no tympanitic BS  Rectal: deferred   Musc/Skeltl: no gross joint deformity Extremities:  No pedal edema  Neurologic:  Pleasant, no tremor, moves all 4 limbs.  Oriented x 3.   Skin:  No telangectasia or palmar erythema.  Tattoos:  none Nodes:  No adenopathy at groin or neck    Psych:  Pleasant, not anxious.   Intake/Output from previous day:   Intake/Output this shift: Total I/O In: 1000 [I.V.:1000] Out: -   LAB RESULTS:  Basename 12/24/11 0733  WBC 9.1  HGB 11.7*  HCT 34.0*  PLT 238   BMET Lab Results  Component Value Date   NA 139 12/24/2011   NA 133* 08/10/2011   NA  140 07/30/2011   K 3.7 12/24/2011   K 5.0 08/10/2011   K 3.1* 07/30/2011   CL 98 12/24/2011   CL 92* 08/10/2011   CL 103 07/30/2011   CO2 30 12/24/2011   CO2 26 08/10/2011   CO2 28 07/30/2011   GLUCOSE 121* 12/24/2011   GLUCOSE 90 08/10/2011   GLUCOSE 97 07/30/2011   BUN 15 12/24/2011   BUN 14 08/10/2011   BUN 11 07/30/2011   CREATININE 0.70 12/24/2011   CREATININE 0.7 08/10/2011   CREATININE 0.65 07/30/2011   CALCIUM 9.8 12/24/2011   CALCIUM 8.9 08/10/2011   CALCIUM 8.5 07/30/2011   LFT  Basename 12/24/11 0743 12/24/11 0733  PROT 7.6 7.6  ALBUMIN 4.0 4.0  AST 20 20  ALT 13 12  ALKPHOS 112 112  BILITOT 0.3 0.3  BILIDIR <0.1 --  IBILI NOT CALCULATED --   PT/INR No results found for this basename: INR, PROTIME    RADIOLOGY STUDIES: Ct Abdomen Pelvis W Contrast 12/24/2011  Comparison: 07/30/2011  Findings: Moderate wall thickening and mesenteric inflammatory changes are seen involving the distal small bowel loops in the lower abdomen and pelvis.  There is no evidence of bowel obstruction. Proximal mesenteric vessels appear patent.  Normal appendix is visualized.  Mild sigmoid diverticulosis is demonstrated, however there is no evidence of diverticulitis.  Minimal ascites seen in both upper quadrants.  Prior cholecystectomy noted.  The liver, pancreas, adrenal glands, and kidneys are normal appearance.  No evidence of hydronephrosis.  No soft tissue masses or lymphadenopathy identified.  Right hip prosthesis noted.  IMPRESSION:  1.  Findings consistent with acute enteritis involving the distal small bowel loops in the lower abdomen and pelvis.  Differential diagnosis includes infectious etiologies, inflammatory bowel disease, hemorrhage, vasculitis, and bowel ischemia. 2.  No evidence of bowel obstruction or abscess.   Original Report Authenticated By: Danae Orleans, M.D.      IMPRESSION: *  Enteritis, acute. ? Infectious, ? Ischemic, ? vasculitis. Unlikely to be Crohn's dz.   *  Possible coffee ground  emesis vs vomiting charred food. Hgb is stable c/w May and June levels.  *  Hip fracture and hemiarthroplasty in [place], [place] in 06/2011. *  CAD, stent 2006.  Does not require Plavix *  Chronic low dose Prednisone ( 2mg  alternating with 3 mg QOD) for Temporal Arteritis  PLAN: *  Continue the q 24 hour Protonix, IV for now.  *  Not clear that she needs the Cipro/Flagyl given normal WBCs and no fever.  *  CBC in AM. ? Should we get a sed rate?, it was 24 on 12/07/11.  CRP was 15.6 then.  *  Sips of clears as tolerated.    LOS: 0 days   Jennye Moccasin  12/24/2011, 1:57 PM Pager: [phone]   I have taken a history, examined the patient and reviewed the chart. I agree with the extender's note, impression and recommendations. Acute enteritis, most likely infectious or ischemic. Bowel rest, pain control and emesis control. If symptoms do not rapidly improve will evaluate for less likely possibilities of Crohn's and vasculitis.  Meryl Dare MD [facility]

## 2011-12-24 NOTE — ED Notes (Signed)
Pt reports onset of pain under bilateral ribs x several days worsening at 0300.  Pain radiates to abdomen and chest. Pt has vomited 7-8 times PTA.

## 2011-12-24 NOTE — ED Notes (Signed)
Report called to Shanda Bumps, RN, Unit Nurse

## 2011-12-24 NOTE — ED Notes (Signed)
Patient transported to CT via stretcher.

## 2011-12-24 NOTE — ED Notes (Signed)
Family at bedside and informed of plan of care. Urine not collected prior to transport.

## 2011-12-24 NOTE — ED Notes (Signed)
EDP at bedside  

## 2011-12-24 NOTE — Consult Note (Signed)
Patient ID: Betty Jackson MRN: [id number], DOB/AGE: Nov 17, 1934   Admit date: 12/24/2011  Primary Physician: Kristian Covey, MD Primary Cardiologist: C. Clifton James, MD  Pt. Profile:  77 y/o female with h/o CAD who presented to [facility] this AM with nausea, vomiting, and abdominal pain along with sinus bradycardia, who was transferred to [facility] for further evaluation.  Problem List  Past Medical History  Diagnosis Date  . Arthritis     a. s/p Right THA 06/2011  . Headache   . Hypertension   . Hyperlipidemia   . Incontinence of urine   . GERD (gastroesophageal reflux disease)   . Temporal arteritis     a. followed @ [facility]  . CAD (coronary artery disease)     a. Stent RCA in [facility];  b. Cath approx 2009 - nonobs per pt report.  . Sinus bradycardia     a. on chronic bb  . PAF (paroxysmal atrial fibrillation)     a. lone epidode in 2011 according to notes.    Past Surgical History  Procedure Date  . Cholecystectomy   . Tonsillectomy and adenoidectomy   . Abdominal hysterectomy age 58    TAH  . Bladder suspension   . Joint replacement 2013    R THR 06/2011  . Coronary angioplasty with stent placement     Allergies  Allergies  Allergen Reactions  . Tussionex Pennkinetic Er (Hydrocod Polst-Cpm Polst Er) Hives    tussionex DM, hives    HPI  77 y/o female with the above problem list.  She has a h/o CAD dating back to 2006 at which time she underwent stenting of the RCA in [place], [place].  She continued to have intermittent chest pain following stenting and apparently underwent relook cath in 2009, showing patent stent and otherwise nonobs dzs.  From a cardiac standpoint, she has done well.  She is s/p Right total hip arthroplasty in April of this year, so activity is somewhat limited however she is able to do housework and grocery shopping without chest pain or dyspnea.    Last night she ate sausage for dinner.  She says it was overcooked and was "terrible."  This AM, she  awoke around 4AM with profound nausea, vomiting x at least 7, and severe upper abdominal pain.  After several hours of ongoing Ss, she [facility], where she was noted to be bradycardic (sinus - 40) without evidence of high grade heart block, though 1st deg hb was present.  According to ER records, she was hemodynamically stable but was given atropine with subsequent rise in HR.  Of note, ECG from 01/2011 also shows sinus brady (50) with borderline 1st deg avb.    Pt continued to have significant abdominal pain with associated n, v.  There was concern that this could represent inferior ischemia, given h/o RCA stenting, and cardiology was contacted for admission to [facility].  In the meantime, pt was sent for CT of the abdomen/pelvis and this showed acute enteritis involving the distal small bowel loops in the lower abdomen and pelvis w/o evidence of bowel obstruction of abscess.  She was then transferred to [facility].  Here, she has no complaints of c/p or sob.  She is hemodynamically stable though continues to complain of moderate-severe abdominal pain with nausea and dry heaving.  Home Medications  Prior to Admission medications   Medication Sig Start Date End Date Taking? Authorizing Provider  ALPRAZolam Prudy Feeler) 0.5 MG tablet Take 0.5  mg by mouth at bedtime as needed. Sleep.   Yes Historical Provider, MD  amLODipine (NORVASC) 5 MG tablet Take 5 mg by mouth daily.   Yes Historical Provider, MD  aspirin 81 MG tablet Take 81 mg by mouth 2 (two) times daily.    Yes Historical Provider, MD  atorvastatin (LIPITOR) 20 MG tablet Take 20 mg by mouth daily. 08/07/11  Yes Kristian Covey, MD  calcium carbonate (OS-CAL) 600 MG TABS Take 600 mg by mouth daily.   Yes Historical Provider, MD  cholecalciferol (VITAMIN D) 1000 UNITS tablet Take 1,000 Units by mouth 2 (two) times daily.    Yes Historical Provider, MD  fluticasone (FLONASE) 50 MCG/ACT nasal spray Place 2 sprays into the nose daily as  needed. For allergires   Yes Historical Provider, MD  losartan-hydrochlorothiazide (HYZAAR) 50-12.5 MG per tablet Take 1 tablet by mouth daily.     Yes Historical Provider, MD  metoprolol (LOPRESSOR) 50 MG tablet  07/10/11  Yes Kristian Covey, MD  Nutritional Supplements (JUICE PLUS FIBRE PO) Take 1 capsule by mouth daily.   Yes Historical Provider, MD  omeprazole (PRILOSEC) 20 MG capsule TAKE 1 CAPSULE BY MOUTH EVERY DAY 06/11/11  Yes Kristian Covey, MD  predniSONE (DELTASONE) 1 MG tablet Take 2-3 mg by mouth daily. For 1 month starting on sept 23 2013   (2mg  on even days  3mg  on odd days)   Yes Historical Provider, MD  vitamin C (ASCORBIC ACID) 500 MG tablet Take 1,000 mg by mouth daily as needed. For sore throat   Yes Historical Provider, MD   Family History  Family History  Problem Relation Age of Onset  . Cancer Daughter     breast  . Arthritis Neg Hx   . Heart disease Other   . Hypertension Other   . Heart attack Father   . Cancer Mother    Social History  History   Social History  . Marital Status: Married    Spouse Name: N/A    Number of Children: 3  . Years of Education: N/A   Occupational History  . Retired Veterinary surgeon    Social History Main Topics  . Smoking status: Never Smoker   . Smokeless tobacco: Not on file  . Alcohol Use: 1.0 oz/week    2 drink(s) per week     rarely  . Drug Use: No  . Sexually Active: Not on file   Other Topics Concern  . Not on file   Social History Narrative   Lives in [place] with her husband.  Retired Veterinary surgeon.    Review of Systems General:  No chills, fever, night sweats or weight changes.  Cardiovascular:  No chest pain, dyspnea on exertion, edema, orthopnea, palpitations, paroxysmal nocturnal dyspnea. Dermatological: No rash, lesions/masses Respiratory: No cough, dyspnea Urologic: No hematuria, dysuria Abdominal:   +++ nausea, vomiting, abdominal pain as outlined above.  No bright red blood per rectum, melena, or  hematemesis Neurologic:  No visual changes, wkns, changes in mental status. All other systems reviewed and are otherwise negative except as noted above.  Physical Exam  Blood pressure 166/69, pulse 54, temperature 97.6 F (36.4 C), temperature source Oral, resp. rate 18, height 5\' 4"  (1.626 m), weight 140 lb (63.504 kg), SpO2 99.00%.  General: Pleasant, NAD Psych: Flat affect. Neuro: Alert and oriented X 3. Moves all extremities spontaneously. HEENT: Normal  Neck: Supple without bruits or JVD. Lungs:  Resp regular and unlabored, CTA. Heart: RRR no s3,  s4, or murmurs. Abdomen: Soft, diffusely tender, non-distended, BS + x 4.  Extremities: No clubbing, cyanosis or edema. DP/PT/Radials 2+ and equal bilaterally.  Labs  [facility] 12/24/11 0733  CKTOTAL --  CKMB --  TROPONINI <0.30   Lab Results  Component Value Date   WBC 9.1 12/24/2011   HGB 11.7* 12/24/2011   HCT 34.0* 12/24/2011   MCV 81.5 12/24/2011   PLT 238 12/24/2011     Lab 12/24/11 0743 12/24/11 0733  NA -- 139  K -- 3.7  CL -- 98  CO2 -- 30  BUN -- 15  CREATININE -- 0.70  CALCIUM -- 9.8  PROT 7.6 --  BILITOT 0.3 --  ALKPHOS 112 --  ALT 13 --  AST 20 --  GLUCOSE -- 121*   Radiology/Studies  Ct Abdomen Pelvis W Contrast  12/24/2011  *RADIOLOGY REPORT*  Clinical Data: Left-sided abdominal pain.  Nausea and vomiting.  CT ABDOMEN AND PELVIS WITH CONTRAST  Technique:  Multidetector CT imaging of the abdomen and pelvis was performed following the standard protocol during bolus administration of intravenous contrast.  Contrast: OMNIPAQUE IOHEXOL 300 MG/ML  SOLN  Comparison: 07/30/2011  Findings: Moderate wall thickening and mesenteric inflammatory changes are seen involving the distal small bowel loops in the lower abdomen and pelvis.  There is no evidence of bowel obstruction. Proximal mesenteric vessels appear patent.  Normal appendix is visualized.  Mild sigmoid diverticulosis is demonstrated, however there is no  evidence of diverticulitis.  Minimal ascites seen in both upper quadrants.  Prior cholecystectomy noted.  The liver, pancreas, adrenal glands, and kidneys are normal appearance.  No evidence of hydronephrosis.  No soft tissue masses or lymphadenopathy identified.  Right hip prosthesis noted.  IMPRESSION:  1.  Findings consistent with acute enteritis involving the distal small bowel loops in the lower abdomen and pelvis.  Differential diagnosis includes infectious etiologies, inflammatory bowel disease, hemorrhage, vasculitis, and bowel ischemia. 2.  No evidence of bowel obstruction or abscess.   Original Report Authenticated By: Danae Orleans, M.D.    Dg Chest Port 1 View  12/24/2011  *RADIOLOGY REPORT*  Clinical Data: Chest pain, abdominal pain, vomiting, history hypertension, coronary artery disease post PTCA and stenting  PORTABLE CHEST - 1 VIEW  Comparison: Portable exam 0810 hours without priors for comparison.  Findings: Normal heart size, mediastinal contours, and pulmonary vascularity. Minimal peribronchial thickening. No pulmonary infiltrate, pleural effusion or pneumothorax. Mild atherosclerotic calcification at aortic arch. Left glenohumeral degenerative changes.  IMPRESSION: Minimal bronchitic changes.   Original Report Authenticated By: Lollie Marrow, M.D.    ECG  Sinus bradycardia, 40, 1st deg avb, no acute st/t changes.  ASSESSMENT AND PLAN  1.  Acute enteritis:  Case discussed with Dr. Mitchel Honour with [facility] hospitalists, who has kindly agreed to admit and manage this issue.  Pt continues to have significant abdominal pain, n, v.  She is hemodynamically stable.  2.  Sinus Bradycardia:  She has a h/o SB with a rate of 50 by ecg in 01/2011.  She has a first deg avb, but no high grade HB is noted.  Will cont to follow.  Place hold parameters on bb therapy.  She is not having any chest pain or dyspnea.  3.  CAD:  S/p stenting in 2006.  Last cath in 2009 - [place] - reportedly nonobs dzs.  No  chest pain or dyspnea.  No acute st/t changes on ecg.  Will follow.  4.  HTN:  Stable.  5.  HL:  On statin @ home.  6.  H/o PAF:  In sinus.  BB as bp/hr allows.  Signed, Nicolasa Ducking, NP 12/24/2011, 1:28 PM  Patient seen and examined.  Agree with findings of C Berge.   Briefly patient 77 yo with history of CAD, stent to RCA in 2009.  Presents with N/V epigastric and adominal pain. Found to be bradycardic with HR in 40s.  Given atropine at [facility] ER.  One BP was relatively low at 103/  Not sure what other meds given around that time.  HR was 30.  Again given atropine. Contacted for concern of possible coronary ischemia. CT showed evidence of enteritis.  Patient reports chronic intermitt CP  This has not changed.  Breathing has been good.  Activity good.  On exam, Lung:  CTA  Cardiac:  RRR.  No S3.  No Murmurs.  Abd  Mild diffuse tenderness.  Ext:  2+ pulses  No edema.  EKG:  Sinus bradycardia.  I am not convinced of active angina.  Patient has a history of relatie bradycardia.  With abdomenal pain and wretching increased bradycardia is not unexpected.  Her hemodynamics have been good.    We will follow with you.      Dietrich Pates 3:46 PM 12/24/2011

## 2011-12-24 NOTE — ED Notes (Signed)
Pt placed on defib monitor and pacer pads.  EDP at bedside for evaluation and Atropine administered.

## 2011-12-24 NOTE — ED Notes (Signed)
[facility] at bedside and preparing for transport. 

## 2011-12-25 ENCOUNTER — Encounter: Payer: Self-pay | Admitting: Gastroenterology

## 2011-12-25 DIAGNOSIS — R109 Unspecified abdominal pain: Secondary | ICD-10-CM

## 2011-12-25 LAB — COMPREHENSIVE METABOLIC PANEL
AST: 13 U/L (ref 0–37)
Albumin: 3 g/dL — ABNORMAL LOW (ref 3.5–5.2)
Alkaline Phosphatase: 90 U/L (ref 39–117)
Chloride: 103 mEq/L (ref 96–112)
Potassium: 3.6 mEq/L (ref 3.5–5.1)
Total Bilirubin: 0.6 mg/dL (ref 0.3–1.2)

## 2011-12-25 LAB — CBC
Platelets: 183 10*3/uL (ref 150–400)
RDW: 13.8 % (ref 11.5–15.5)
WBC: 7.6 10*3/uL (ref 4.0–10.5)

## 2011-12-25 MED ORDER — PANTOPRAZOLE SODIUM 40 MG PO TBEC
40.0000 mg | DELAYED_RELEASE_TABLET | Freq: Every day | ORAL | Status: DC
  Administered 2011-12-25 – 2011-12-26 (×2): 40 mg via ORAL
  Filled 2011-12-25 (×3): qty 1

## 2011-12-25 NOTE — Progress Notes (Signed)
Patient Name: Betty Jackson Date of Encounter: 12/25/2011     Principal Problem:  *Enteritis Active Problems:  Hypertension  Hyperlipidemia  CAD (coronary artery disease)  GERD (gastroesophageal reflux disease)  Abdominal  pain, other specified site    SUBJECTIVE: Feels well this AM. No further n/v. No chest pain, sob, palpitations, lightheadedness, PND or orthopnea.    OBJECTIVE  Filed Vitals:   12/24/11 1245 12/24/11 1517 12/24/11 2100 12/25/11 0500  BP: 166/69 126/66 132/55 105/45  Pulse: 54 60 66 63  Temp: 97.6 F (36.4 C)  98.9 F (37.2 C) 98.7 F (37.1 C)  TempSrc:   Oral Oral  Resp: 18     Height:      Weight:      SpO2: 99%  97% 97%    Intake/Output Summary (Last 24 hours) at 12/25/11 1009 Last data filed at 12/24/11 1900  Gross per 24 hour  Intake   1900 ml  Output    300 ml  Net   1600 ml   Weight change:   PHYSICAL EXAM  General: Well developed, well nourished, in no acute distress. Head: Normocephalic, atraumatic, sclera non-icteric, no xanthomas, nares are without discharge.  Neck: Supple without bruits or JVD. Lungs:  Resp regular and unlabored, CTA. Heart: RRR, III/VI systolic cresc-decresc murmur at RUSB radiating to carotids, no s3, s4, or murmurs. Abdomen: Soft, non-tender, non-distended, BS + x 4.  Msk:  Strength and tone appears normal for age. Extremities: No clubbing, cyanosis or edema. DP/PT/Radials 2+ and equal bilaterally. Neuro: Alert and oriented X 3. Moves all extremities spontaneously. Psych: Normal affect.  LABS:  Recent Labs  [facility] 12/25/11 0635 12/24/11 0733   WBC 7.6 9.1   HGB 9.9* 11.7*   HCT 29.7* 34.0*   MCV 82.0 81.5   PLT 183 238   Lab 12/25/11 0635 12/24/11 0743 12/24/11 0733  NA 139 -- 139  K 3.6 -- 3.7  CL 103 -- 98  CO2 28 -- 30  BUN 9 -- 15  CREATININE 0.72 -- 0.70  CALCIUM 8.7 -- 9.8  PROT 5.9* -- --  BILITOT 0.6 -- --  ALKPHOS 90 -- --  ALT 8 -- --  AST 13 -- --  AMYLASE -- -- --  LIPASE  -- 18 --  GLUCOSE 89 -- 121*   Recent Labs  [facility] 12/24/11 0733   CKTOTAL --   CKMB --   CKMBINDEX --   TROPONINI <0.30    TELE: NSR, 60-70 bpm, occasional PVCs  Radiology/Studies:  Ct Abdomen Pelvis W Contrast  12/24/2011  *RADIOLOGY REPORT*  Clinical Data: Left-sided abdominal pain.  Nausea and vomiting.  CT ABDOMEN AND PELVIS WITH CONTRAST  Technique:  Multidetector CT imaging of the abdomen and pelvis was performed following the standard protocol during bolus administration of intravenous contrast.  Contrast: OMNIPAQUE IOHEXOL 300 MG/ML  SOLN  Comparison: 07/30/2011  Findings: Moderate wall thickening and mesenteric inflammatory changes are seen involving the distal small bowel loops in the lower abdomen and pelvis.  There is no evidence of bowel obstruction. Proximal mesenteric vessels appear patent.  Normal appendix is visualized.  Mild sigmoid diverticulosis is demonstrated, however there is no evidence of diverticulitis.  Minimal ascites seen in both upper quadrants.  Prior cholecystectomy noted.  The liver, pancreas, adrenal glands, and kidneys are normal appearance.  No evidence of hydronephrosis.  No soft tissue masses or lymphadenopathy identified.  Right hip prosthesis noted.  IMPRESSION:  1.  Findings consistent with acute enteritis  involving the distal small bowel loops in the lower abdomen and pelvis.  Differential diagnosis includes infectious etiologies, inflammatory bowel disease, hemorrhage, vasculitis, and bowel ischemia. 2.  No evidence of bowel obstruction or abscess.   Original Report Authenticated By: Danae Orleans, M.D.    Dg Chest Port 1 View  12/24/2011  *RADIOLOGY REPORT*  Clinical Data: Chest pain, abdominal pain, vomiting, history hypertension, coronary artery disease post PTCA and stenting  PORTABLE CHEST - 1 VIEW  Comparison: Portable exam 0810 hours without priors for comparison.  Findings: Normal heart size, mediastinal contours, and pulmonary  vascularity. Minimal peribronchial thickening. No pulmonary infiltrate, pleural effusion or pneumothorax. Mild atherosclerotic calcification at aortic arch. Left glenohumeral degenerative changes.  IMPRESSION: Minimal bronchitic changes.   Original Report Authenticated By: Lollie Marrow, M.D.     Current Medications:     . hydrochlorothiazide  12.5 mg Oral Daily  . HYDROmorphone  1 mg Intravenous Once  . losartan  50 mg Oral Daily  . pantoprazole  40 mg Oral Q0600  . predniSONE  2 mg Oral QODAY   And  . predniSONE  3 mg Oral QODAY  . promethazine  12.5 mg Intravenous Once  . sodium chloride  3 mL Intravenous Q12H  . DISCONTD: ciprofloxacin  400 mg Intravenous Q12H  . DISCONTD: influenza  inactive virus vaccine  0.5 mL Intramuscular Tomorrow-1000  . DISCONTD: losartan-hydrochlorothiazide  1 tablet Oral Daily  . DISCONTD: metronidazole  500 mg Intravenous Q8H  . DISCONTD: pantoprazole (PROTONIX) IV  40 mg Intravenous Q24H  . DISCONTD: pneumococcal 23 valent vaccine  0.5 mL Intramuscular Tomorrow-1000  . DISCONTD: predniSONE  2-3 mg Oral Daily    ASSESSMENT AND PLAN:  1. Acute enteritis- admission diagnosis. Patient presented with nausea and diffuse vomiting with associated abdominal pain. Abd CT yesterday revealed an acute enteritis. Had eaten overcooked sausages the night before. Did not taste right per patient. There is a question of coffee ground emesis. There is a decline in her Hgb today. No further n/v or abdominal pain. Managed per GI - ? Ischemia vs infectious.   2. Sinus bradycardia- sinus bradycardia with HR in 40s. HR improved with rates in 60-70s. NSR. She does have a history of 1st degree AVB on prior EKGs, but no higher grade block. Bradycardia on admission likely 2/2 high vagal tone with n/v and use of narcotics in morphine (last dose yest afternoon). Favor restarting BB therapy for CAD benefit as her rates are much improved. Could consider initiating at a lower dose and  up-titrating while monitoring HR.   3. CAD- history of prior stenting in 2006 in [place], [place]. Repeat cath in 2009 revealed patent stent and nonobstructive disease. These were performed due to worsening functional capacity. She endorsed intermittent chest pain episodes exacerbated by anxiety primarily and relieved with valium. She denies progressive functional decline or worsening chest pain since following up with Dr. Clifton James in 01/2011. On a good cardiac regimen. Continue ARB, BB, statin. Hold ASA while GIB is ruled out. She will need a new prescription for SL NTG prior to d/c. She will also need an appointment with Dr. Clifton James soon as part of her annual follow-up.   4. Murmur- consistent with AS. Unable to locate prior echo. Would order to further assess.  5. Anemia- Hgb 11.7-->9.9 from yesterday to today. There is a question of coffee ground emesis vs regurgitation of charred food. No melena. No prior hematemesis or bloody stool. She was hydrated on admission also and  hemodilution could represent low H/H. Acute enteritis DDx ischemia vs infectious. Further management per GI.   6. HTN- well-controlled; mildly labile this AM while sleeping.   7. HL- continue statin.   8. History of PAF- isolated in 2011. No instances while inpatient. DDx for acute enteritis includes ischemia. Cardioembolic source to GI vasculature less likely. Will need to determine etiology of enteritis to guide evaluation of this. Currently NSR.    Signed, R. Hurman Horn, PA-C 12/25/2011, 10:09 AM  I have personally seen and examined this patient with Hurman Horn, PA-C. I agree with the assessment and plan as outlined above. She is feeling much better. Stable. Will sign off. Call with questions.   Leiland Mihelich 8:08 PM 12/25/2011

## 2011-12-25 NOTE — Progress Notes (Signed)
[facility]             Progress Note   Subjective: Doing well, no further N/V/Abd pain  Objective: Vital signs in last 24 hours: Temp:  [97.6 F (36.4 C)-98.9 F (37.2 C)] 98.7 F (37.1 C) (09/27 0500) Pulse Rate:  [54-66] 63  (09/27 0500) Resp:  [18] 18  (09/26 1245) BP: (105-166)/(45-69) 105/45 mmHg (09/27 0500) SpO2:  [97 %-99 %] 97 % (09/27 0500) Weight change:  Last BM Date: 12/23/11  Intake/Output from previous day: 09/26 0701 - 09/27 0700 In: 1900 [I.V.:1900] Out: 300 [Urine:300]     Physical Exam: General: Alert, awake, oriented x3, in no acute distress. HEENT: No bruits, no goiter. Heart: Regular rate and rhythm, without murmurs, rubs, gallops. Lungs: Clear to auscultation bilaterally. Abdomen: Soft, nontender, nondistended, positive bowel sounds. Extremities: No clubbing cyanosis or edema with positive pedal pulses. Neuro: Grossly intact, nonfocal.    Lab Results: Basic Metabolic Panel:  Basename 12/25/11 0635 12/24/11 0733  NA 139 139  K 3.6 3.7  CL 103 98  CO2 28 30  GLUCOSE 89 121*  BUN 9 15  CREATININE 0.72 0.70  CALCIUM 8.7 9.8  MG -- 1.8  PHOS -- --   Liver Function Tests:  [facility] 12/25/11 0635 12/24/11 0743  AST 13 20  ALT 8 13  ALKPHOS 90 112  BILITOT 0.6 0.3  PROT 5.9* 7.6  ALBUMIN 3.0* 4.0    Basename 12/24/11 0743  LIPASE 18  AMYLASE --   No results found for this basename: AMMONIA:2 in the last 72 hours CBC:  Basename 12/25/11 0635 12/24/11 0733  WBC 7.6 9.1  NEUTROABS -- --  HGB 9.9* 11.7*  HCT 29.7* 34.0*  MCV 82.0 81.5  PLT 183 238   Cardiac Enzymes:  Basename 12/24/11 0733  CKTOTAL --  CKMB --  CKMBINDEX --  TROPONINI <0.30   BNP: No results found for this basename: PROBNP:3 in the last 72 hours D-Dimer: No results found for this basename: DDIMER:2 in the last 72 hours CBG: No results found for this basename: GLUCAP:6 in the last 72 hours Hemoglobin A1C: No results found for this  basename: HGBA1C in the last 72 hours Fasting Lipid Panel: No results found for this basename: CHOL,HDL,LDLCALC,TRIG,CHOLHDL,LDLDIRECT in the last 72 hours Thyroid Function Tests: No results found for this basename: TSH,T4TOTAL,FREET4,T3FREE,THYROIDAB in the last 72 hours Anemia Panel: No results found for this basename: VITAMINB12,FOLATE,FERRITIN,TIBC,IRON,RETICCTPCT in the last 72 hours Coagulation: No results found for this basename: LABPROT:2,INR:2 in the last 72 hours Urine Drug Screen: Drugs of Abuse  No results found for this basename: labopia, cocainscrnur, labbenz, amphetmu, thcu, labbarb    Alcohol Level: No results found for this basename: ETH:2 in the last 72 hours Urinalysis:  Basename 12/24/11 2156  COLORURINE YELLOW  LABSPEC 1.015  PHURINE 6.0  GLUCOSEU NEGATIVE  HGBUR TRACE*  BILIRUBINUR NEGATIVE  KETONESUR 15*  PROTEINUR NEGATIVE  UROBILINOGEN 0.2  NITRITE NEGATIVE  LEUKOCYTESUR NEGATIVE    No results found for this or any previous visit (from the past 240 hour(s)).  Studies/Results: Ct Abdomen Pelvis W Contrast  12/24/2011  *RADIOLOGY REPORT*  Clinical Data: Left-sided abdominal pain.  Nausea and vomiting.  CT ABDOMEN AND PELVIS WITH CONTRAST  Technique:  Multidetector CT imaging of the abdomen and pelvis was performed following the standard protocol during bolus administration of intravenous contrast.  Contrast: OMNIPAQUE IOHEXOL 300 MG/ML  SOLN  Comparison: 07/30/2011  Findings: Moderate wall thickening and mesenteric inflammatory changes are  seen involving the distal small bowel loops in the lower abdomen and pelvis.  There is no evidence of bowel obstruction. Proximal mesenteric vessels appear patent.  Normal appendix is visualized.  Mild sigmoid diverticulosis is demonstrated, however there is no evidence of diverticulitis.  Minimal ascites seen in both upper quadrants.  Prior cholecystectomy noted.  The liver, pancreas, adrenal glands, and kidneys are  normal appearance.  No evidence of hydronephrosis.  No soft tissue masses or lymphadenopathy identified.  Right hip prosthesis noted.  IMPRESSION:  1.  Findings consistent with acute enteritis involving the distal small bowel loops in the lower abdomen and pelvis.  Differential diagnosis includes infectious etiologies, inflammatory bowel disease, hemorrhage, vasculitis, and bowel ischemia. 2.  No evidence of bowel obstruction or abscess.   Original Report Authenticated By: Danae Orleans, M.D.    Dg Chest Port 1 View  12/24/2011  *RADIOLOGY REPORT*  Clinical Data: Chest pain, abdominal pain, vomiting, history hypertension, coronary artery disease post PTCA and stenting  PORTABLE CHEST - 1 VIEW  Comparison: Portable exam 0810 hours without priors for comparison.  Findings: Normal heart size, mediastinal contours, and pulmonary vascularity. Minimal peribronchial thickening. No pulmonary infiltrate, pleural effusion or pneumothorax. Mild atherosclerotic calcification at aortic arch. Left glenohumeral degenerative changes.  IMPRESSION: Minimal bronchitic changes.   Original Report Authenticated By: Lollie Marrow, M.D.     Medications: Scheduled Meds:   . hydrochlorothiazide  12.5 mg Oral Daily  . losartan  50 mg Oral Daily  . pantoprazole  40 mg Oral Q0600  . predniSONE  2 mg Oral QODAY   And  . predniSONE  3 mg Oral QODAY  . sodium chloride  3 mL Intravenous Q12H  . DISCONTD: ciprofloxacin  400 mg Intravenous Q12H  . DISCONTD: influenza  inactive virus vaccine  0.5 mL Intramuscular Tomorrow-1000  . DISCONTD: losartan-hydrochlorothiazide  1 tablet Oral Daily  . DISCONTD: metronidazole  500 mg Intravenous Q8H  . DISCONTD: pantoprazole (PROTONIX) IV  40 mg Intravenous Q24H  . DISCONTD: pneumococcal 23 valent vaccine  0.5 mL Intramuscular Tomorrow-1000  . DISCONTD: predniSONE  2-3 mg Oral Daily   Continuous Infusions:   . sodium chloride 10 mL/hr at 12/25/11 0931   PRN Meds:.acetaminophen,  acetaminophen, ALPRAZolam, morphine injection, ondansetron (ZOFRAN) IV, promethazine, DISCONTD:  morphine injection  Assessment/Plan: 1. Enteritis: Etiology unclear at this point, could be infectious versus ischemic  Supportive care with IV fluids, antiemetics,  Clear liquids advance as tolerated   lactic acid level normal Empiric antibiotics, ciprofloxacin and Flagyl stopped Protonix 40 mg IV every 24  Appreciate Gi input, FU with Dr.Stark 10/24  2. Vomitus with dark/black material, unclear if this was hemetemesis  Secondary to 1, vs ? Mallory Weis tears   PPI changed to PO Hemoglobin dropped this am, could be from ? hematemesis yesterday vs hemodilution, continue to monitor CBC in am    3. H/o Temporal arteritis: continue home dose of prednisone  4. Bradycardia/hypotension: secondary to high vagal tone in setting of 1, and worsened by IV narcotics  Improved now, metoprolol on hold,  slowly resume as BP and HR tolerate  Monitor on Tele   5. HTN: resume Losartan   6. H/o CAD: stable, ASA/Metoprolol on hold   7. DVT prophylaxis: SCDS       Time spent coordinating care:   LOS: 1 day   [facility] Pager: [phone] 12/25/2011, 12:18 PM

## 2011-12-25 NOTE — Progress Notes (Signed)
     [facility] Daily Rounding Note 12/25/2011, 8:33 AM  SUBJECTIVE:       Feels a lot better, not having abd pain or nausea.  Last morphine was at 1644 9/26. No stools.   OBJECTIVE:         Vital signs in last 24 hours:    Temp:  [97.6 F (36.4 C)-98.9 F (37.2 C)] 98.7 F (37.1 C) (09/27 0500) Pulse Rate:  [54-66] 63  (09/27 0500) Resp:  [16-18] 18  (09/26 1245) BP: (105-166)/(45-69) 105/45 mmHg (09/27 0500) SpO2:  [94 %-100 %] 97 % (09/27 0500) Last BM Date: 12/23/11 General: looks well    Heart: RRR Chest: clear B  Abdomen: soft, NT, ND, active BS  Extremities: no pedal edema Neuro/Psych:  Pleasant, fully oriented.  No gross deficits.   Intake/Output from previous day: 09/26 0701 - 09/27 0700 In: 1900 [I.V.:1900] Out: 300 [Urine:300]  Intake/Output this shift:    Lab Results:  Basename 12/25/11 0635 12/24/11 0733  WBC 7.6 9.1  HGB 9.9* 11.7*  HCT 29.7* 34.0*  PLT 183 238   BMET  Basename 12/25/11 0635 12/24/11 0733  NA 139 139  K 3.6 3.7  CL 103 98  CO2 28 30  GLUCOSE 89 121*  BUN 9 15  CREATININE 0.72 0.70  CALCIUM 8.7 9.8   LFT  Basename 12/25/11 0635 12/24/11 0743 12/24/11 0733  PROT 5.9* 7.6 7.6  ALBUMIN 3.0* 4.0 4.0  AST 13 20 20   ALT 8 13 12   ALKPHOS 90 112 112  BILITOT 0.6 0.3 0.3  BILIDIR -- <0.1 --  IBILI -- NOT CALCULATED --    Studies/Results: Ct Abdomen Pelvis W Contrast 12/24/2011    IMPRESSION:  1.  Findings consistent with acute enteritis involving the distal small bowel loops in the lower abdomen and pelvis.  Differential diagnosis includes infectious etiologies, inflammatory bowel disease, hemorrhage, vasculitis, and bowel ischemia. 2.  No evidence of bowel obstruction or abscess.   Original Report Authenticated By: Danae Orleans, M.D.       ASSESMENT: * Enteritis, acute. Most likely cause is ischemia or infectious. Unlikely to be Crohn's dz.  * Possible coffee ground emesis vs vomiting charred food. Post hydration Hgb is  down nearly 2 grams. * Hip fracture and hemiarthroplasty in [place], [place] in 06/2011.  * CAD, stent 2006. Does not require Plavix  * Chronic low dose Prednisone ( 2mg  alternating with 3 mg QOD) for Temporal Arteritis *  Chronic bradycardia, worsened in setting of acute GI illness and received Atropine in ED *  ? Previous colon polyps, pt not sure.  Last colonoscopy was in [place] about 5 years ago.    PLAN: *  Trial clears *  Stop Cipro and Flagyl *  CBC in AM *  Has ROV set for Oct 24th with Dr Russella Dar.    LOS: 1 day   Jennye Moccasin  12/25/2011, 8:33 AM Pager: [phone]   I have taken an interval history, reviewed the chart and examined the patient. I agree with the extender's note, impression and recommendations. Resolving, self limited enteritis. No further evaluation at this time. Hb primarily related to hydration. Probably home tomorrow. We will sign off.  Venita Lick. Russella Dar MD Clementeen Graham

## 2011-12-26 LAB — CBC
HCT: 32.6 % — ABNORMAL LOW (ref 36.0–46.0)
MCHC: 34.7 g/dL (ref 30.0–36.0)
MCV: 81.3 fL (ref 78.0–100.0)
Platelets: 196 10*3/uL (ref 150–400)
RDW: 13.8 % (ref 11.5–15.5)
WBC: 5.9 10*3/uL (ref 4.0–10.5)

## 2011-12-26 MED ORDER — METOPROLOL TARTRATE 50 MG PO TABS: 25.0000 mg | ORAL_TABLET | Freq: Two times a day (BID) | ORAL | Status: DC

## 2011-12-26 MED ORDER — ASPIRIN 81 MG PO TABS: 81.0000 mg | ORAL_TABLET | Freq: Every day | ORAL | Status: DC

## 2011-12-26 MED ORDER — HYDROCODONE-ACETAMINOPHEN 5-500 MG PO TABS: 1.0000 | ORAL_TABLET | Freq: Four times a day (QID) | ORAL | Status: DC | PRN

## 2011-12-26 MED ORDER — ALPRAZOLAM 0.5 MG PO TABS: 0.5000 mg | ORAL_TABLET | Freq: Every evening | ORAL | Status: DC | PRN

## 2012-01-04 ENCOUNTER — Other Ambulatory Visit: Payer: Medicare Other

## 2012-01-07 NOTE — Discharge Summary (Signed)
Physician Discharge Summary  Patient ID: Betty Jackson MRN: [id number] DOB/AGE: [birth year] 77 y.o.  Admit date: 12/24/2011 Discharge date: 12/26/2011  Primary Care Physician:  Kristian Covey, MD  Cardiologist: Dr.McAlhaney, Brooke DareEmmie Niemann   Discharge Diagnoses:    Principal Problem:  *Enteritis viral vs ischemic Active Problems:  Hypertension  Hyperlipidemia  CAD (coronary artery disease)  GERD (gastroesophageal reflux disease)  Abdominal  pain, other specified site      Medication List     As of 01/07/2012  2:51 PM    TAKE these medications         ALPRAZolam 0.5 MG tablet   Commonly known as: XANAX   Take 0.5 mg by mouth at bedtime as needed. Sleep.      ALPRAZolam 0.5 MG tablet   Commonly known as: XANAX   Take 1 tablet (0.5 mg total) by mouth at bedtime as needed for sleep.      amLODipine 5 MG tablet   Commonly known as: NORVASC   Take 5 mg by mouth daily.      aspirin 81 MG tablet   Take 1 tablet (81 mg total) by mouth daily.      atorvastatin 20 MG tablet   Commonly known as: LIPITOR   Take 20 mg by mouth daily.      calcium carbonate 600 MG Tabs   Commonly known as: OS-CAL   Take 600 mg by mouth daily.      cholecalciferol 1000 UNITS tablet   Commonly known as: VITAMIN D   Take 1,000 Units by mouth 2 (two) times daily.      fluticasone 50 MCG/ACT nasal spray   Commonly known as: FLONASE   Place 2 sprays into the nose daily as needed. For allergires      HYDROcodone-acetaminophen 5-500 MG per tablet   Commonly known as: VICODIN   Take 1 tablet by mouth every 6 (six) hours as needed for pain.      JUICE PLUS FIBRE PO   Take 1 capsule by mouth daily.      losartan-hydrochlorothiazide 50-12.5 MG per tablet   Commonly known as: HYZAAR   Take 1 tablet by mouth daily.      metoprolol 50 MG tablet   Commonly known as: LOPRESSOR   Take 0.5 tablets (25 mg total) by mouth 2 (two) times daily.      omeprazole 20 MG capsule   Commonly known as: PRILOSEC   TAKE 1 CAPSULE BY MOUTH EVERY DAY      predniSONE 1 MG tablet   Commonly known as: DELTASONE   Take 2-3 mg by mouth daily. For 1 month starting on sept 23 2013   (2mg  on even days  3mg  on odd days)      vitamin C 500 MG tablet   Commonly known as: ASCORBIC ACID   Take 1,000 mg by mouth daily as needed. For sore throat         Disposition and Follow-up:  Dr.Stark, Oct 24th     Consults:  Brooke Dare, Dr.Stark [facility] Dr.Ross   Significant Diagnostic Studies:   CT ABDOMEN AND PELVIS WITH CONTRAST  Technique: Multidetector CT imaging of the abdomen and pelvis was performed following the standard protocol during bolus administration of intravenous contrast.  Contrast: OMNIPAQUE IOHEXOL 300 MG/ML SOLN  Comparison: 07/30/2011  Findings: Moderate wall thickening and mesenteric inflammatory changes are seen involving the distal small bowel loops in the lower abdomen and pelvis. There is no  evidence of bowel obstruction. Proximal mesenteric vessels appear patent. Normal appendix is visualized. Mild sigmoid diverticulosis is demonstrated, however there is no evidence of diverticulitis.  Minimal ascites seen in both upper quadrants. Prior cholecystectomy noted. The liver, pancreas, adrenal glands, and kidneys are normal appearance. No evidence of hydronephrosis. No soft tissue masses or lymphadenopathy identified. Right hip prosthesis noted.  IMPRESSION:  1. Findings consistent with acute enteritis involving the distal small bowel loops in the lower abdomen and pelvis. Differential diagnosis includes infectious etiologies, inflammatory bowel disease, hemorrhage, vasculitis, and bowel ischemia. 2. No evidence of bowel obstruction or abscess.      Brief H and P:  Betty Jackson is a 77 year old female with history of hypertension, dyslipidemia, CAD, temporal arteritis on prednisone was in her usual state of health until around 4  AM this morning.  She recalls eating sausage yesterday night, then reports waking up with severe peri umbilical abdominal pain, associated with nausea and vomiting.  She vomited 7 or 8 times this morning, and the vomitus contained black material, denies any bright or maroon blood.  Subsequently presented to her the med center at [facility], she was given IV fentanyl and dilaudid, subsequently became bradycardic and briefly hypotensive, was then given 1 mg of atropine, and improvement in her heart rate and blood pressure.  Also Had a CT of her abdomen pelvis done which showed evidence of enteritis involving the distal small bowel.  She was originally transferred to the cardiology service, subsequently admitted to Hospitalists service since her issues are primarily GI.  Hospital Course:  1. Enteritis: Etiology unclear at this point, could be Viral versus ischemic  Improved with Supportive care with IV fluids, antiemetics,  Clear liquids advance as tolerated  lactic acid level normal  Empiric antibiotics, ciprofloxacin and Flagyl stopped per Gi recommendations Appreciate Gi input, FU with Dr.Stark 10/24   2. Vomitus with dark/black material, unclear if this was hemetemesis  Secondary to 1, vs ? Mallory Weis tears  PPI changed to PO  Hemoglobin relatively stable, no further inpatient workup per Gi  3. H/o Temporal arteritis: continue home dose of prednisone   4. Bradycardia/hypotension: secondary to high vagal tone in setting of 1, and worsened by IV narcotics  Improved now, metoprolol on hold, slowly resume as BP and HR tolerate at a lower dose Monitor on Tele     Time spent on Discharge:  Signed: Dvonte Jackson [facility]  01/07/2012, 2:51 PM

## 2012-01-13 ENCOUNTER — Telehealth: Payer: Self-pay | Admitting: Family Medicine

## 2012-01-13 NOTE — Telephone Encounter (Signed)
Pt was in hosp 2 wks ago. Pt said that she is suppose to come in for hosp fup with Dr Caryl Never. Pt is req to come in to see Dr Caryl Never next week. Pt may need to have lab work done prior to ov. Pls advise.

## 2012-01-13 NOTE — Telephone Encounter (Signed)
Okey Regal, I know next week in filling up quickly, (after vacation).  Please try to schedule post hospital visit, (30 min) and advise pt to fast just in case he will want fasting labs.  Thank you.

## 2012-01-14 NOTE — Telephone Encounter (Signed)
Called pt and schd a work in appt for Fri 01/12/12 at 9:45am as noted.

## 2012-01-21 ENCOUNTER — Ambulatory Visit: Payer: Medicare Other | Admitting: Gastroenterology

## 2012-01-22 ENCOUNTER — Telehealth: Payer: Self-pay | Admitting: Gastroenterology

## 2012-01-22 ENCOUNTER — Encounter: Payer: Self-pay | Admitting: Family Medicine

## 2012-01-22 ENCOUNTER — Ambulatory Visit (INDEPENDENT_AMBULATORY_CARE_PROVIDER_SITE_OTHER): Payer: Medicare Other | Admitting: Family Medicine

## 2012-01-22 VITALS — BP 140/70 | HR 72 | Temp 98.0°F | Resp 12 | Wt 147.0 lb

## 2012-01-22 DIAGNOSIS — K5289 Other specified noninfective gastroenteritis and colitis: Secondary | ICD-10-CM

## 2012-01-22 DIAGNOSIS — M79609 Pain in unspecified limb: Secondary | ICD-10-CM | POA: Diagnosis not present

## 2012-01-22 DIAGNOSIS — K529 Noninfective gastroenteritis and colitis, unspecified: Secondary | ICD-10-CM

## 2012-01-22 DIAGNOSIS — I10 Essential (primary) hypertension: Secondary | ICD-10-CM

## 2012-01-22 DIAGNOSIS — M316 Other giant cell arteritis: Secondary | ICD-10-CM | POA: Diagnosis not present

## 2012-01-22 LAB — SEDIMENTATION RATE: Sed Rate: 30 mm/hr — ABNORMAL HIGH (ref 0–22)

## 2012-01-22 NOTE — Patient Instructions (Addendum)
Follow up promptly for any recurrent abdominal pain, vomiting, or bloody stools/persistent diarrhea.

## 2012-01-22 NOTE — Progress Notes (Signed)
Subjective:    Patient ID: Betty Jackson, female    DOB: 1935-02-19, 77 y.o.   MRN: [id number]  HPI  Patient seen for hospital followup. She was recently admitted for epigastric discomfort with vomiting and some diarrhea. She was admitted on 12/24/2011 discharged 12/26/2011. Patient presented initially to [facility] with symptoms above and was given IV fentanyl and Dilaudid and subsequently became bradycardic and hypotensive. Her heartrate improved after atropine and eventually her blood pressure improved. CT abdomen and pelvis revealed evidence for acute enteritis involving distal small bowel. Question of infectious versus inflammatory bowel versus vasculitis versus bowel ischemia. Patient was transferred and admitted. No evidence for bowel obstruction. No evidence for abscess. Patient initially treated with Flagyl and Cipro and this was discontinued after GI consult.  Patient treated symptomatically with IV fluids and support and eventually her symptoms improved. Patient is convinced her symptoms related to some sausage she had eaten. She has not had any recurrence since then. Never had any melena. No definite hematemesis. Hemoglobin was stable 11.3 which is near her baseline. Patient had most recent colonoscopy about 5 years ago in [place]. She she feels well this time with good appetite.  Blood pressure medications were modified during hospitalization. Metoprolol decreased to 25 mg twice a day. No orthostasis.  Past Medical History  Diagnosis Date  . Arthritis      Right hemiarthroplasty after hip fracture 06/2011  . Headache   . Hypertension   . Hyperlipidemia   . Incontinence of urine   . GERD (gastroesophageal reflux disease)   . Temporal arteritis     a. followed @ [facility]  . CAD (coronary artery disease)     a. Stent RCA in [facility];  b. Cath approx 2009 - nonobs per pt report.  . Sinus bradycardia     a. on chronic bb  . PAF (paroxysmal atrial fibrillation)     a.  lone epidode in 2011 according to notes.  . Depression   . Shortness of breath   . Anemia   . DDD (degenerative disc disease)   . Scoliosis   . Vocal cord dysfunction    Past Surgical History  Procedure Date  . Laparoscopic cholecystectomy 2001  . Tonsillectomy and adenoidectomy   . Abdominal hysterectomy age 60    TAH  . Bladder suspension   . Hemiarthroplasty hip 06/2011    fractured her hip and repaired in [place], [place]  . Coronary angioplasty with stent placement 2006    reports that she has never smoked. She has never used smokeless tobacco. She reports that she drinks about one ounce of alcohol per week. She reports that she does not use illicit drugs. family history includes Cancer in her daughter and mother; Heart attack in her father; Heart disease in her other; and Hypertension in her other.  There is no history of Arthritis. Allergies  Allergen Reactions  . Tussionex Pennkinetic Er (Hydrocod Polst-Cpm Polst Er) Hives    tussionex DM, hives      Review of Systems  Constitutional: Negative for fever, chills, appetite change and unexpected weight change.  Respiratory: Negative for cough and shortness of breath.   Cardiovascular: Negative for chest pain.  Gastrointestinal: Negative for nausea, vomiting, diarrhea, constipation, blood in stool and abdominal distention.  Neurological: Negative for dizziness and syncope.       Objective:   Physical Exam  Constitutional: She appears well-developed and well-nourished.  HENT:  Right Ear: External ear normal.  Left Ear:  External ear normal.  Mouth/Throat: Oropharynx is clear and moist.  Neck: Neck supple.  Cardiovascular: Normal rate and regular rhythm.   Pulmonary/Chest: Effort normal and breath sounds normal. No respiratory distress. She has no wheezes. She has no rales.  Abdominal: Soft. Bowel sounds are normal. She exhibits no distension and no mass. There is no tenderness. There is no rebound and no guarding.           Assessment & Plan:  Patient presented recently with acute enteritis, question infectious. Symptoms totally resolved this time with supportive therapy. Has followup with GI scheduled in November. Followup promptly for any recurrent symptoms.  Hypertension. Stable. Continue current regimen. Continue close monitoring  Hx temporal arteritis.  Sed rate with results to [facility].

## 2012-01-22 NOTE — Telephone Encounter (Signed)
Patient called to report that she has pain that started about 1 hour ago. Crampy lower abdominal pain. She is feeling the same type of pain she had with her admission for enteritis in September.  Patient is advised that if her symptoms worsen or do not improve she should be evaluated in the ER.  She had significant bradycardia with the last episode and required atropine. She is scheduled to see Mike Gip PA on Monday at 9:00 if she does not require admission over the weekend.  The patient is advised to place herself on a clear liquid diet and go to the ER if her symptoms do not resolve.

## 2012-01-23 ENCOUNTER — Encounter (HOSPITAL_COMMUNITY): Payer: Self-pay | Admitting: Emergency Medicine

## 2012-01-23 ENCOUNTER — Inpatient Hospital Stay (HOSPITAL_COMMUNITY)
Admission: EM | Admit: 2012-01-23 | Discharge: 2012-01-27 | DRG: 392 | Disposition: A | Discharge status: Home / Self Care | Payer: Medicare Other | Source: Ambulatory Visit | Attending: Internal Medicine | Admitting: Internal Medicine

## 2012-01-23 ENCOUNTER — Emergency Department (HOSPITAL_COMMUNITY): Payer: Medicare Other

## 2012-01-23 DIAGNOSIS — Z96649 Presence of unspecified artificial hip joint: Secondary | ICD-10-CM

## 2012-01-23 DIAGNOSIS — Z7982 Long term (current) use of aspirin: Secondary | ICD-10-CM

## 2012-01-23 DIAGNOSIS — K648 Other hemorrhoids: Secondary | ICD-10-CM | POA: Diagnosis present

## 2012-01-23 DIAGNOSIS — K644 Residual hemorrhoidal skin tags: Secondary | ICD-10-CM | POA: Diagnosis present

## 2012-01-23 DIAGNOSIS — R112 Nausea with vomiting, unspecified: Secondary | ICD-10-CM | POA: Diagnosis not present

## 2012-01-23 DIAGNOSIS — I498 Other specified cardiac arrhythmias: Secondary | ICD-10-CM | POA: Diagnosis present

## 2012-01-23 DIAGNOSIS — Z79899 Other long term (current) drug therapy: Secondary | ICD-10-CM

## 2012-01-23 DIAGNOSIS — IMO0002 Reserved for concepts with insufficient information to code with codable children: Secondary | ICD-10-CM

## 2012-01-23 DIAGNOSIS — K5730 Diverticulosis of large intestine without perforation or abscess without bleeding: Secondary | ICD-10-CM | POA: Diagnosis present

## 2012-01-23 DIAGNOSIS — R109 Unspecified abdominal pain: Secondary | ICD-10-CM | POA: Diagnosis present

## 2012-01-23 DIAGNOSIS — I4891 Unspecified atrial fibrillation: Secondary | ICD-10-CM | POA: Diagnosis present

## 2012-01-23 DIAGNOSIS — R319 Hematuria, unspecified: Secondary | ICD-10-CM | POA: Diagnosis present

## 2012-01-23 DIAGNOSIS — K5289 Other specified noninfective gastroenteritis and colitis: Principal | ICD-10-CM | POA: Diagnosis present

## 2012-01-23 DIAGNOSIS — F3289 Other specified depressive episodes: Secondary | ICD-10-CM | POA: Diagnosis present

## 2012-01-23 DIAGNOSIS — Z23 Encounter for immunization: Secondary | ICD-10-CM

## 2012-01-23 DIAGNOSIS — K219 Gastro-esophageal reflux disease without esophagitis: Secondary | ICD-10-CM

## 2012-01-23 DIAGNOSIS — R933 Abnormal findings on diagnostic imaging of other parts of digestive tract: Secondary | ICD-10-CM

## 2012-01-23 DIAGNOSIS — Z9861 Coronary angioplasty status: Secondary | ICD-10-CM

## 2012-01-23 DIAGNOSIS — E876 Hypokalemia: Secondary | ICD-10-CM | POA: Diagnosis present

## 2012-01-23 DIAGNOSIS — E785 Hyperlipidemia, unspecified: Secondary | ICD-10-CM | POA: Diagnosis present

## 2012-01-23 DIAGNOSIS — I10 Essential (primary) hypertension: Secondary | ICD-10-CM | POA: Diagnosis not present

## 2012-01-23 DIAGNOSIS — F329 Major depressive disorder, single episode, unspecified: Secondary | ICD-10-CM | POA: Diagnosis present

## 2012-01-23 DIAGNOSIS — B86 Scabies: Secondary | ICD-10-CM | POA: Diagnosis present

## 2012-01-23 DIAGNOSIS — R1110 Vomiting, unspecified: Secondary | ICD-10-CM | POA: Diagnosis not present

## 2012-01-23 DIAGNOSIS — I2510 Atherosclerotic heart disease of native coronary artery without angina pectoris: Secondary | ICD-10-CM | POA: Diagnosis present

## 2012-01-23 DIAGNOSIS — E871 Hypo-osmolality and hyponatremia: Secondary | ICD-10-CM | POA: Diagnosis not present

## 2012-01-23 DIAGNOSIS — R1013 Epigastric pain: Secondary | ICD-10-CM | POA: Diagnosis not present

## 2012-01-23 DIAGNOSIS — Z8679 Personal history of other diseases of the circulatory system: Secondary | ICD-10-CM

## 2012-01-23 DIAGNOSIS — K529 Noninfective gastroenteritis and colitis, unspecified: Secondary | ICD-10-CM | POA: Diagnosis present

## 2012-01-23 DIAGNOSIS — M316 Other giant cell arteritis: Secondary | ICD-10-CM | POA: Diagnosis present

## 2012-01-23 LAB — COMPREHENSIVE METABOLIC PANEL
ALT: 13 U/L (ref 0–35)
AST: 20 U/L (ref 0–37)
Albumin: 4 g/dL (ref 3.5–5.2)
Calcium: 9.3 mg/dL (ref 8.4–10.5)
Creatinine, Ser: 0.5 mg/dL (ref 0.50–1.10)
Sodium: 126 mEq/L — ABNORMAL LOW (ref 135–145)
Total Protein: 7.5 g/dL (ref 6.0–8.3)

## 2012-01-23 LAB — CBC WITH DIFFERENTIAL/PLATELET
Basophils Absolute: 0 10*3/uL (ref 0.0–0.1)
Basophils Relative: 0 % (ref 0–1)
Eosinophils Relative: 0 % (ref 0–5)
HCT: 34.3 % — ABNORMAL LOW (ref 36.0–46.0)
Lymphocytes Relative: 16 % (ref 12–46)
MCHC: 34.4 g/dL (ref 30.0–36.0)
MCV: 78.5 fL (ref 78.0–100.0)
Monocytes Absolute: 0.7 10*3/uL (ref 0.1–1.0)
Platelets: 204 10*3/uL (ref 150–400)
RDW: 13 % (ref 11.5–15.5)
WBC: 8.3 10*3/uL (ref 4.0–10.5)

## 2012-01-23 LAB — URINE MICROSCOPIC-ADD ON

## 2012-01-23 LAB — POCT I-STAT TROPONIN I: Troponin i, poc: 0.02 ng/mL (ref 0.00–0.08)

## 2012-01-23 LAB — URINALYSIS, ROUTINE W REFLEX MICROSCOPIC
Bilirubin Urine: NEGATIVE
Leukocytes, UA: NEGATIVE
Nitrite: NEGATIVE
Specific Gravity, Urine: 1.017 (ref 1.005–1.030)
Urobilinogen, UA: 0.2 mg/dL (ref 0.0–1.0)
pH: 6.5 (ref 5.0–8.0)

## 2012-01-23 LAB — LACTIC ACID, PLASMA: Lactic Acid, Venous: 0.8 mmol/L (ref 0.5–2.2)

## 2012-01-23 MED ORDER — LORAZEPAM 2 MG/ML IJ SOLN
1.0000 mg | Freq: Once | INTRAMUSCULAR | Status: AC
  Administered 2012-01-23: 1 mg via INTRAVENOUS
  Filled 2012-01-23: qty 1

## 2012-01-23 MED ORDER — IOHEXOL 300 MG/ML  SOLN: 20.0000 mL | INTRAMUSCULAR | Status: AC

## 2012-01-23 MED ORDER — ONDANSETRON HCL 4 MG/2ML IJ SOLN
4.0000 mg | Freq: Once | INTRAMUSCULAR | Status: AC
  Administered 2012-01-23: 4 mg via INTRAVENOUS
  Filled 2012-01-23: qty 2

## 2012-01-23 MED ORDER — SODIUM CHLORIDE 0.9 % IV BOLUS (SEPSIS)
1000.0000 mL | Freq: Once | INTRAVENOUS | Status: AC
  Administered 2012-01-23: 1000 mL via INTRAVENOUS

## 2012-01-23 MED ORDER — SODIUM CHLORIDE 0.9 % IV SOLN
INTRAVENOUS | Status: DC
  Administered 2012-01-24: 125 mL/h via INTRAVENOUS

## 2012-01-23 MED ORDER — ONDANSETRON HCL 4 MG/2ML IJ SOLN
4.0000 mg | Freq: Once | INTRAMUSCULAR | Status: AC
  Administered 2012-01-24: 4 mg via INTRAVENOUS
  Filled 2012-01-23: qty 2

## 2012-01-23 NOTE — ED Notes (Signed)
Pt states she is suffering from scabies outbreak

## 2012-01-23 NOTE — ED Notes (Signed)
Pt c/o epigastric pain with N/V/D starting yesterday; pt sts hx of same in past

## 2012-01-23 NOTE — ED Provider Notes (Signed)
History     CSN: [id number]  Arrival date & time 01/23/12  1655   First MD Initiated Contact with Patient 01/23/12 2132      Chief Complaint  Patient presents with  . Abdominal Pain    (Consider location/radiation/quality/duration/timing/severity/associated sxs/prior treatment) Patient is a 77 y.o. female presenting with abdominal pain. The history is provided by the patient.  Abdominal Pain The primary symptoms of the illness include abdominal pain.   Patient here complaining of abdominal pain with nausea vomiting diarrhea which started yesterday. History of similar symptoms in the past was admitted in September of this year and diagnosed with colitis possibly ischemic. Denies any fever at this time. No bloody stools. Pain is sharp in the epigastric area. No associated anginal type symptoms. No rashes noted. Symptoms have been persistent and nothing makes them better or worse. No treatment used prior to arrival Past Medical History  Diagnosis Date  . Arthritis      Right hemiarthroplasty after hip fracture 06/2011  . Headache   . Hypertension   . Hyperlipidemia   . Incontinence of urine   . GERD (gastroesophageal reflux disease)   . Temporal arteritis     a. followed @ [facility]  . CAD (coronary artery disease)     a. Stent RCA in [facility];  b. Cath approx 2009 - nonobs per pt report.  . Sinus bradycardia     a. on chronic bb  . PAF (paroxysmal atrial fibrillation)     a. lone epidode in 2011 according to notes.  . Depression   . Shortness of breath   . Anemia   . DDD (degenerative disc disease)   . Scoliosis   . Vocal cord dysfunction     Past Surgical History  Procedure Date  . Laparoscopic cholecystectomy 2001  . Tonsillectomy and adenoidectomy   . Abdominal hysterectomy age 89    TAH  . Bladder suspension   . Hemiarthroplasty hip 06/2011    fractured her hip and repaired in [place], [place]  . Coronary angioplasty with stent placement 2006    Family  History  Problem Relation Age of Onset  . Cancer Daughter     breast  . Arthritis Neg Hx   . Heart disease Other   . Hypertension Other   . Heart attack Father   . Cancer Mother     History  Substance Use Topics  . Smoking status: Never Smoker   . Smokeless tobacco: Never Used  . Alcohol Use: 1.0 oz/week    2 drink(s) per week     rarely    OB History    Grav Para Term Preterm Abortions TAB SAB Ect Mult Living                  Review of Systems  Gastrointestinal: Positive for abdominal pain.  All other systems reviewed and are negative.    Allergies  Tussionex pennkinetic er  Home Medications   Current Outpatient Rx  Name Route Sig Dispense Refill  . ALPRAZOLAM 0.5 MG PO TABS Oral Take 1 tablet (0.5 mg total) by mouth at bedtime as needed for sleep. 20 tablet 0  . AMLODIPINE BESYLATE 5 MG PO TABS Oral Take 5 mg by mouth daily.    . ASPIRIN EC 81 MG PO TBEC Oral Take 81 mg by mouth every evening.    . ATORVASTATIN CALCIUM 20 MG PO TABS Oral Take 20 mg by mouth daily.    Marland Kitchen CALCIUM CARBONATE 600  MG PO TABS Oral Take 600 mg by mouth daily.    Marland Kitchen VITAMIN D 1000 UNITS PO TABS Oral Take 1,000 Units by mouth 2 (two) times daily.     Marland Kitchen HYDROCODONE-ACETAMINOPHEN 5-500 MG PO TABS Oral Take 1 tablet by mouth every 6 (six) hours as needed for pain. 30 tablet 0  . LOSARTAN POTASSIUM-HCTZ 50-12.5 MG PO TABS Oral Take 1 tablet by mouth daily.      Marland Kitchen METOPROLOL TARTRATE 50 MG PO TABS Oral Take 25 mg by mouth 2 (two) times daily.     Marland Kitchen JUICE PLUS FIBRE PO Oral Take 1 capsule by mouth daily.    Marland Kitchen OMEPRAZOLE 20 MG PO CPDR Oral Take 20 mg by mouth daily.    Marland Kitchen PREDNISONE 1 MG PO TABS Oral Take 2-3 mg by mouth See admin instructions. 2mg  on even days  3mg  on odd days    . VITAMIN C 500 MG PO TABS Oral Take 1,000 mg by mouth daily as needed. For sore throat      BP 151/56  Pulse 61  Temp 97.7 F (36.5 C) (Oral)  Resp 16  SpO2 96%  Physical Exam  Nursing note and vitals  reviewed. Constitutional: She is oriented to person, place, and time. She appears well-developed and well-nourished.  Non-toxic appearance. No distress.  HENT:  Head: Normocephalic and atraumatic.  Eyes: Conjunctivae normal, EOM and lids are normal. Pupils are equal, round, and reactive to light.  Neck: Normal range of motion. Neck supple. No tracheal deviation present. No mass present.  Cardiovascular: Normal rate, regular rhythm and normal heart sounds.  Exam reveals no gallop.   No murmur heard. Pulmonary/Chest: Effort normal and breath sounds normal. No stridor. No respiratory distress. She has no decreased breath sounds. She has no wheezes. She has no rhonchi. She has no rales.  Abdominal: Soft. Normal appearance and bowel sounds are normal. She exhibits no distension. There is tenderness in the epigastric area. There is no rigidity, no rebound, no guarding and no CVA tenderness.  Musculoskeletal: Normal range of motion. She exhibits no edema and no tenderness.  Neurological: She is alert and oriented to person, place, and time. She has normal strength. No cranial nerve deficit or sensory deficit. GCS eye subscore is 4. GCS verbal subscore is 5. GCS motor subscore is 6.  Skin: Skin is warm and dry. No abrasion and no rash noted.  Psychiatric: She has a normal mood and affect. Her speech is normal and behavior is normal.    ED Course  Procedures (including critical care time)  Labs Reviewed  CBC WITH DIFFERENTIAL - Abnormal; Notable for the following:    Hemoglobin 11.8 (*)     HCT 34.3 (*)     All other components within normal limits  COMPREHENSIVE METABOLIC PANEL - Abnormal; Notable for the following:    Sodium 126 (*)     Potassium 3.0 (*)     Chloride 88 (*)     Alkaline Phosphatase 125 (*)     All other components within normal limits  LIPASE, BLOOD  POCT I-STAT TROPONIN I  URINALYSIS, ROUTINE W REFLEX MICROSCOPIC  URINALYSIS, ROUTINE W REFLEX MICROSCOPIC  URINE CULTURE   LACTIC ACID, PLASMA   Dg Abd Acute W/chest  01/23/2012  *RADIOLOGY REPORT*  Clinical Data: Central abdominal pain and vomiting.  ACUTE ABDOMEN SERIES (ABDOMEN 2 VIEW & CHEST 1 VIEW)  Comparison: Abdomen 08/05/2011, chest 12/24/2011, and CT abdomen and pelvis 12/24/2011.  Findings: Normal heart size  and pulmonary vascularity.  Mild coarse parenchymal opacities in the lung bases suggest fibrosis. Scattered calcified granulomas in the left lung with calcified left hilar lymph nodes.  No focal airspace consolidation.  No blunting of costophrenic angles.  No pneumothorax.  Calcification of the aorta.  No significant change since previous chest radiograph.  Thoracolumbar scoliosis with postoperative changes at L3-4 region. Surgical clips in the right upper quadrant.  Scattered gas and stool in the colon.  Scattered gas in normal caliber small bowel. No small or large bowel distension.  No free intra-abdominal air. No abnormal air fluid levels.  No radiopaque stones.  Postoperative changes in the right hip.  IMPRESSION: No evidence of active pulmonary disease.  Nonobstructive bowel gas pattern.   Original Report Authenticated By: Marlon Pel, M.D.      No diagnosis found.    MDM  Pt to have abd ct and dr. Lavella Lemons to follow        Toy Baker, MD 01/24/12 0000

## 2012-01-24 ENCOUNTER — Encounter (HOSPITAL_COMMUNITY): Payer: Self-pay | Admitting: Internal Medicine

## 2012-01-24 ENCOUNTER — Emergency Department (HOSPITAL_COMMUNITY): Payer: Medicare Other

## 2012-01-24 DIAGNOSIS — E871 Hypo-osmolality and hyponatremia: Secondary | ICD-10-CM | POA: Diagnosis not present

## 2012-01-24 DIAGNOSIS — R1031 Right lower quadrant pain: Secondary | ICD-10-CM | POA: Diagnosis not present

## 2012-01-24 DIAGNOSIS — R933 Abnormal findings on diagnostic imaging of other parts of digestive tract: Secondary | ICD-10-CM | POA: Diagnosis not present

## 2012-01-24 DIAGNOSIS — R109 Unspecified abdominal pain: Secondary | ICD-10-CM | POA: Diagnosis not present

## 2012-01-24 DIAGNOSIS — K219 Gastro-esophageal reflux disease without esophagitis: Secondary | ICD-10-CM | POA: Diagnosis not present

## 2012-01-24 DIAGNOSIS — B86 Scabies: Secondary | ICD-10-CM | POA: Diagnosis present

## 2012-01-24 DIAGNOSIS — R1013 Epigastric pain: Secondary | ICD-10-CM | POA: Diagnosis not present

## 2012-01-24 DIAGNOSIS — R319 Hematuria, unspecified: Secondary | ICD-10-CM | POA: Diagnosis present

## 2012-01-24 DIAGNOSIS — K5289 Other specified noninfective gastroenteritis and colitis: Secondary | ICD-10-CM | POA: Diagnosis not present

## 2012-01-24 DIAGNOSIS — R112 Nausea with vomiting, unspecified: Secondary | ICD-10-CM | POA: Diagnosis not present

## 2012-01-24 LAB — BASIC METABOLIC PANEL
BUN: 6 mg/dL (ref 6–23)
BUN: 6 mg/dL (ref 6–23)
BUN: 5 mg/dL — ABNORMAL LOW (ref 6–23)
CO2: 21 mEq/L (ref 19–32)
CO2: 24 mEq/L (ref 19–32)
Calcium: 8.8 mg/dL (ref 8.4–10.5)
Calcium: 8.8 mg/dL (ref 8.4–10.5)
Chloride: 98 mEq/L (ref 96–112)
Creatinine, Ser: 0.56 mg/dL (ref 0.50–1.10)
Creatinine, Ser: 0.57 mg/dL (ref 0.50–1.10)
Creatinine, Ser: 0.57 mg/dL (ref 0.50–1.10)
GFR calc Af Amer: >90 mL/min (ref >90)
GFR calc non Af Amer: 88 mL/min — ABNORMAL LOW (ref >90)
Glucose, Bld: 95 mg/dL (ref 70–99)
Glucose, Bld: 80 mg/dL (ref 70–99)

## 2012-01-24 LAB — COMPREHENSIVE METABOLIC PANEL
ALT: 10 U/L (ref 0–35)
Alkaline Phosphatase: 104 U/L (ref 39–117)
CO2: 25 mEq/L (ref 19–32)
Chloride: 97 mEq/L (ref 96–112)
GFR calc Af Amer: >90 mL/min (ref >90)
Glucose, Bld: 81 mg/dL (ref 70–99)
Potassium: 3.2 mEq/L — ABNORMAL LOW (ref 3.5–5.1)
Sodium: 132 mEq/L — ABNORMAL LOW (ref 135–145)
Total Protein: 6.4 g/dL (ref 6.0–8.3)

## 2012-01-24 LAB — CBC
HCT: 30.7 % — ABNORMAL LOW (ref 36.0–46.0)
MCHC: 35.8 g/dL (ref 30.0–36.0)
RDW: 13.2 % (ref 11.5–15.5)

## 2012-01-24 LAB — CREATININE, URINE, RANDOM: Creatinine, Urine: 21.85 mg/dL

## 2012-01-24 LAB — MAGNESIUM: Magnesium: 1.6 mg/dL (ref 1.5–2.5)

## 2012-01-24 MED ORDER — ENOXAPARIN SODIUM 40 MG/0.4ML ~~LOC~~ SOLN
40.0000 mg | SUBCUTANEOUS | Status: AC
  Administered 2012-01-24 – 2012-01-25 (×2): 40 mg via SUBCUTANEOUS
  Filled 2012-01-24 (×2): qty 0.4

## 2012-01-24 MED ORDER — OXYCODONE HCL 5 MG PO TABS
5.0000 mg | ORAL_TABLET | ORAL | Status: DC | PRN
  Administered 2012-01-27: 5 mg via ORAL
  Filled 2012-01-24: qty 1

## 2012-01-24 MED ORDER — SODIUM CHLORIDE 0.9 % IV SOLN: INTRAVENOUS | Status: DC

## 2012-01-24 MED ORDER — ONDANSETRON HCL 4 MG PO TABS: 4.0000 mg | ORAL_TABLET | Freq: Four times a day (QID) | ORAL | Status: DC | PRN

## 2012-01-24 MED ORDER — SODIUM CHLORIDE 0.9 % IJ SOLN
3.0000 mL | Freq: Two times a day (BID) | INTRAMUSCULAR | Status: DC
  Administered 2012-01-24 – 2012-01-27 (×2): 3 mL via INTRAVENOUS

## 2012-01-24 MED ORDER — IVERMECTIN 3 MG PO TABS
200.0000 ug/kg | ORAL_TABLET | Freq: Once | ORAL | Status: AC
  Administered 2012-01-24: 13500 ug via ORAL
  Filled 2012-01-24: qty 5

## 2012-01-24 MED ORDER — IOHEXOL 300 MG/ML  SOLN
100.0000 mL | Freq: Once | INTRAMUSCULAR | Status: AC | PRN
  Administered 2012-01-24: 100 mL via INTRAVENOUS

## 2012-01-24 MED ORDER — ATORVASTATIN CALCIUM 20 MG PO TABS
20.0000 mg | ORAL_TABLET | Freq: Every day | ORAL | Status: DC
  Administered 2012-01-24 – 2012-01-27 (×4): 20 mg via ORAL
  Filled 2012-01-24 (×4): qty 1

## 2012-01-24 MED ORDER — PREDNISONE 1 MG PO TABS
3.0000 mg | ORAL_TABLET | ORAL | Status: DC
  Administered 2012-01-24 – 2012-01-26 (×2): 3 mg via ORAL
  Filled 2012-01-24 (×2): qty 3

## 2012-01-24 MED ORDER — ALPRAZOLAM 0.5 MG PO TABS
0.5000 mg | ORAL_TABLET | Freq: Every evening | ORAL | Status: DC | PRN
  Administered 2012-01-24 – 2012-01-26 (×4): 0.5 mg via ORAL
  Filled 2012-01-24 (×4): qty 1

## 2012-01-24 MED ORDER — POTASSIUM CHLORIDE 10 MEQ/100ML IV SOLN
10.0000 meq | INTRAVENOUS | Status: AC
  Administered 2012-01-24 (×4): 10 meq via INTRAVENOUS
  Filled 2012-01-24 (×4): qty 100

## 2012-01-24 MED ORDER — PREDNISONE 1 MG PO TABS
2.0000 mg | ORAL_TABLET | ORAL | Status: DC
  Administered 2012-01-25 – 2012-01-27 (×2): 2 mg via ORAL
  Filled 2012-01-24 (×2): qty 2

## 2012-01-24 MED ORDER — AMLODIPINE BESYLATE 5 MG PO TABS
5.0000 mg | ORAL_TABLET | Freq: Every day | ORAL | Status: DC
  Administered 2012-01-24 – 2012-01-27 (×4): 5 mg via ORAL
  Filled 2012-01-24 (×4): qty 1

## 2012-01-24 MED ORDER — ACETAMINOPHEN 650 MG RE SUPP: 650.0000 mg | Freq: Four times a day (QID) | RECTAL | Status: DC | PRN

## 2012-01-24 MED ORDER — POTASSIUM CHLORIDE 2 MEQ/ML IV SOLN
INTRAVENOUS | Status: DC
  Filled 2012-01-24 (×2): qty 1000

## 2012-01-24 MED ORDER — METOPROLOL TARTRATE 25 MG PO TABS
25.0000 mg | ORAL_TABLET | Freq: Two times a day (BID) | ORAL | Status: DC
  Administered 2012-01-24 – 2012-01-27 (×7): 25 mg via ORAL
  Filled 2012-01-24 (×8): qty 1

## 2012-01-24 MED ORDER — ACETAMINOPHEN 325 MG PO TABS
650.0000 mg | ORAL_TABLET | Freq: Four times a day (QID) | ORAL | Status: DC | PRN
  Administered 2012-01-27: 650 mg via ORAL
  Filled 2012-01-24: qty 2

## 2012-01-24 MED ORDER — PERMETHRIN 5 % EX CREA
TOPICAL_CREAM | Freq: Once | CUTANEOUS | Status: AC
  Administered 2012-01-24: 06:00:00 via TOPICAL
  Filled 2012-01-24: qty 60

## 2012-01-24 MED ORDER — POTASSIUM CHLORIDE IN NACL 40-0.9 MEQ/L-% IV SOLN
INTRAVENOUS | Status: DC
  Filled 2012-01-24 (×3): qty 1000

## 2012-01-24 MED ORDER — FAMOTIDINE IN NACL 20-0.9 MG/50ML-% IV SOLN
20.0000 mg | Freq: Once | INTRAVENOUS | Status: AC
  Administered 2012-01-24: 20 mg via INTRAVENOUS
  Filled 2012-01-24: qty 50

## 2012-01-24 MED ORDER — SODIUM CHLORIDE 0.9 % IV BOLUS (SEPSIS): 1000.0000 mL | Freq: Once | INTRAVENOUS | Status: DC

## 2012-01-24 MED ORDER — SODIUM CHLORIDE 0.9 % IV SOLN
INTRAVENOUS | Status: DC
  Administered 2012-01-24 – 2012-01-25 (×2): via INTRAVENOUS
  Filled 2012-01-24 (×7): qty 1000

## 2012-01-24 MED ORDER — PANTOPRAZOLE SODIUM 40 MG PO TBEC
40.0000 mg | DELAYED_RELEASE_TABLET | Freq: Every day | ORAL | Status: DC
  Administered 2012-01-24 – 2012-01-27 (×4): 40 mg via ORAL
  Filled 2012-01-24 (×4): qty 1

## 2012-01-24 MED ORDER — ASPIRIN EC 81 MG PO TBEC
81.0000 mg | DELAYED_RELEASE_TABLET | Freq: Every evening | ORAL | Status: DC
  Administered 2012-01-24 – 2012-01-26 (×3): 81 mg via ORAL
  Filled 2012-01-24 (×4): qty 1

## 2012-01-24 MED ORDER — ONDANSETRON HCL 4 MG/2ML IJ SOLN
4.0000 mg | Freq: Four times a day (QID) | INTRAMUSCULAR | Status: DC | PRN
  Administered 2012-01-24: 4 mg via INTRAVENOUS
  Filled 2012-01-24: qty 2

## 2012-01-24 MED ORDER — MORPHINE SULFATE 2 MG/ML IJ SOLN: 2.0000 mg | INTRAMUSCULAR | Status: DC | PRN

## 2012-01-24 MED ORDER — SODIUM CHLORIDE 0.9 % IV SOLN
INTRAVENOUS | Status: DC
  Administered 2012-01-24: 06:00:00 via INTRAVENOUS

## 2012-01-24 NOTE — Progress Notes (Signed)
Patient seen and examined Recurrent episodes of abdominal pain with nausea vomiting CT scan reviewed GI consultation obtained for possible colonoscopy Patient clinically dehydrated and hypokalemic we'll replete Ivermectin for scabies

## 2012-01-24 NOTE — H&P (Signed)
PCP:    Kristian Covey, MD    Chief Complaint:   Abdominal pain nausea and vomiting  HPI: Betty Jackson is a 77 y.o. female   has a past medical history of Arthritis; Headache; Hypertension; Hyperlipidemia; Incontinence of urine; GERD (gastroesophageal reflux disease); Temporal arteritis; CAD (coronary artery disease); Sinus bradycardia; PAF (paroxysmal atrial fibrillation); Depression; Shortness of breath; Anemia; DDD (degenerative disc disease); Scoliosis; and Vocal cord dysfunction.   Presented with  Patient has had abdominal pain nausea and vomiting for the past 36 hours. She had had a recent admissions for the same and was diagnosed to enteritis. Initially she was started on IV antibiotics but that has been discontinued  as per GI recommendations. Patient still had not been seen by GI as an outpatient she is scheduled to have an appointment for father workup when his symptoms have resumed. At this point family states that they're much milder than her prior admission but still worried about an early. Patient does not appear to be toxic. Of note she and her family has recently been diagnosed with scabies.   Review of Systems:    Pertinent positives include: pruritus, chills, fatigue, abdominal pain, nausea, vomiting,   Constitutional:  No weight loss, night sweats, Fevers,  weight loss  HEENT:  No headaches, Difficulty swallowing,Tooth/dental problems,Sore throat,  No sneezing, itching, ear ache, nasal congestion, post nasal drip,  Cardio-vascular:  No chest pain, Orthopnea, PND, anasarca, dizziness, palpitations.no Bilateral lower extremity swelling  GI:  No heartburn, indigestion,diarrhea, change in bowel habits, loss of appetite, melena, blood in stool, hematemesis Resp:  no shortness of breath at rest. No dyspnea on exertion, No excess mucus, no productive cough, No non-productive cough, No coughing up of blood.No change in color of mucus.No wheezing. Skin:  no rash or lesions.  No jaundice GU:  no dysuria, change in color of urine, no urgency or frequency. No straining to urinate.  No flank pain.  Musculoskeletal:  No joint pain or no joint swelling. No decreased range of motion. No back pain.  Psych:  No change in mood or affect. No depression or anxiety. No memory loss.  Neuro: no localizing neurological complaints, no tingling, no weakness, no double vision, no gait abnormality, no slurred speech, no confusion  Otherwise ROS are negative except for above, 10 systems were reviewed  Past Medical History: Past Medical History  Diagnosis Date  . Arthritis      Right hemiarthroplasty after hip fracture 06/2011  . Headache   . Hypertension   . Hyperlipidemia   . Incontinence of urine   . GERD (gastroesophageal reflux disease)   . Temporal arteritis     a. followed @ [facility]  . CAD (coronary artery disease)     a. Stent RCA in [facility];  b. Cath approx 2009 - nonobs per pt report.  . Sinus bradycardia     a. on chronic bb  . PAF (paroxysmal atrial fibrillation)     a. lone epidode in 2011 according to notes.  . Depression   . Shortness of breath   . Anemia   . DDD (degenerative disc disease)   . Scoliosis   . Vocal cord dysfunction    Past Surgical History  Procedure Date  . Laparoscopic cholecystectomy 2001  . Tonsillectomy and adenoidectomy   . Abdominal hysterectomy age 69    TAH  . Bladder suspension   . Hemiarthroplasty hip 06/2011    fractured her hip and repaired in [place], [place]  .  Coronary angioplasty with stent placement 2006     Medications: Prior to Admission medications   Medication Sig Start Date End Date Taking? Authorizing Provider  ALPRAZolam Prudy Feeler) 0.5 MG tablet Take 1 tablet (0.5 mg total) by mouth at bedtime as needed for sleep. 12/26/11  Yes Zannie Cove, MD  amLODipine (NORVASC) 5 MG tablet Take 5 mg by mouth daily.   Yes Historical Provider, MD  aspirin EC 81 MG tablet Take 81 mg by mouth every evening.   Yes  Historical Provider, MD  atorvastatin (LIPITOR) 20 MG tablet Take 20 mg by mouth daily. 08/07/11  Yes Kristian Covey, MD  calcium carbonate (OS-CAL) 600 MG TABS Take 600 mg by mouth daily.   Yes Historical Provider, MD  cholecalciferol (VITAMIN D) 1000 UNITS tablet Take 1,000 Units by mouth 2 (two) times daily.    Yes Historical Provider, MD  HYDROcodone-acetaminophen (VICODIN) 5-500 MG per tablet Take 1 tablet by mouth every 6 (six) hours as needed for pain. 12/26/11  Yes Zannie Cove, MD  losartan-hydrochlorothiazide (HYZAAR) 50-12.5 MG per tablet Take 1 tablet by mouth daily.     Yes Historical Provider, MD  metoprolol (LOPRESSOR) 50 MG tablet Take 25 mg by mouth 2 (two) times daily.  12/26/11  Yes Zannie Cove, MD  Nutritional Supplements (JUICE PLUS FIBRE PO) Take 1 capsule by mouth daily.   Yes Historical Provider, MD  omeprazole (PRILOSEC) 20 MG capsule Take 20 mg by mouth daily.   Yes Historical Provider, MD  predniSONE (DELTASONE) 1 MG tablet Take 2-3 mg by mouth See admin instructions. 2mg  on even days  3mg  on odd days   Yes Historical Provider, MD  vitamin C (ASCORBIC ACID) 500 MG tablet Take 1,000 mg by mouth daily as needed. For sore throat   Yes Historical Provider, MD    Allergies:   Allergies  Allergen Reactions  . Tussionex Pennkinetic Er (Hydrocod Polst-Cpm Polst Er) Hives    Social History:  Lives at  home with family    reports that she has never smoked. She has never used smokeless tobacco. She reports that she drinks about one ounce of alcohol per week. She reports that she does not use illicit drugs.   Family History: family history includes Cancer in her daughter and mother; Heart attack in her father; Heart disease in her other; and Hypertension in her other.  There is no history of Arthritis.    Physical Exam: Patient Vitals for the past 24 hrs:  BP Temp Temp src Pulse Resp SpO2  01/24/12 0426 133/58 mmHg - - 71  18  100 %  01/24/12 0144 128/52 mmHg  98.4 F (36.9 C) Oral 62  18  98 %  01/24/12 0018 127/74 mmHg - - 75  18  97 %  01/23/12 1948 151/56 mmHg 97.7 F (36.5 C) Oral 61  16  96 %    1. General:  in No Acute distress 2. Psychological: Alert and Oriented 3. Head/ENT:    Dry Mucous Membranes                          Head Non traumatic, neck supple                          Normal Dentition 4. SKIN:  decreased Skin turgor,  Skin clean Dry, and numerous excoriations noted consistent with scabies affecting her face and hands.   5. Heart: Regular  rate and rhythm no Murmur, Rub or gallop 6. Lungs: Clear to auscultation bilaterally, no wheezes or crackles   7. Abdomen: Soft,  minimally tender, Non distended 8. Lower extremities: no clubbing, cyanosis, or edema 9. Neurologically Grossly intact, moving all 4 extremities equally 10. MSK: Normal range of motion  body mass index is unknown because there is no height or weight on file.   Labs on Admission:   [facility] 01/23/12 1716  NA 126*  K 3.0*  CL 88*  CO2 25  GLUCOSE 93  BUN 8  CREATININE 0.50  CALCIUM 9.3  MG --  PHOS --    Basename 01/23/12 1716  AST 20  ALT 13  ALKPHOS 125*  BILITOT 0.8  PROT 7.5  ALBUMIN 4.0    Basename 01/23/12 1716  LIPASE 13  AMYLASE --    Basename 01/23/12 1716  WBC 8.3  NEUTROABS 6.2  HGB 11.8*  HCT 34.3*  MCV 78.5  PLT 204   No results found for this basename: CKTOTAL:3,CKMB:3,CKMBINDEX:3,TROPONINI:3 in the last 72 hours No results found for this basename: TSH,T4TOTAL,FREET3,T3FREE,THYROIDAB in the last 72 hours No results found for this basename: VITAMINB12:2,FOLATE:2,FERRITIN:2,TIBC:2,IRON:2,RETICCTPCT:2 in the last 72 hours No results found for this basename: HGBA1C    The CrCl is unknown because both a height and weight (above a minimum accepted value) are required for this calculation. ABG No results found for this basename: phart, pco2, po2, hco3, tco2, acidbasedef, o2sat     No results found for this basename:  DDIMER     Other results:  I have pearsonaly reviewed this: ECG REPORT  Rate: 64  Rhythm:  normal sinus rhythm ST&T Change:  T-wave inversion in V1  UA  no evidence of urinary tract infection with evidence of hematouria   Cultures: No results found for this basename: sdes, specrequest, cult, reptstatus       Radiological Exams on Admission: Ct Abdomen Pelvis W Contrast  01/24/2012  *RADIOLOGY REPORT*  Clinical Data: Epigastric pain with nausea, vomiting, and diarrhea.  CT ABDOMEN AND PELVIS WITH CONTRAST  Technique:  Multidetector CT imaging of the abdomen and pelvis was performed following the standard protocol during bolus administration of intravenous contrast.  Contrast: OMNIPAQUE IOHEXOL 300 MG/ML  SOLN  Comparison: 12/24/2011  Findings: Respiratory motion artifact in the lung bases.  Surgical absence of the colon.  Focal low attenuation change in the liver adjacent to the falciform ligament likely represents focal fatty infiltration.  This is stable since the previous study.  The pancreas, spleen, adrenal glands, and retroperitoneal lymph nodes are unremarkable.  Small sub centimeter parenchymal cysts in the kidneys.  No hydronephrosis.  Calcification of the abdominal aorta without aneurysm.  The stomach, small bowel, and colon are not abnormally distended.  There is interval improvement since previous study of the previously demonstrated inflammatory changes in the small bowel.  There is some residual thickening of the wall of the colon consistent with residual inflammatory process or colitis. Mild stranding in the pericolonic fat.  No free air or free fluid in the abdomen.  Pelvis:  Visualization of the low pelvis is obstructed by artifact from the right hip arthroplasty.  No free or loculated pelvic fluid collections demonstrated.  Diverticulosis of the sigmoid colon without diverticulitis.  The appendix is normal.  No bladder wall thickening.  Degenerative changes in the lumbar  spine and hips. Lumbar scoliosis.  IMPRESSION: Improvement of previously demonstrated inflammatory changes in the small bowel with residual colonic wall thickening suggesting colitis  or inflammatory bowel disease.   Original Report Authenticated By: Marlon Pel, M.D.    Dg Abd Acute W/chest  01/23/2012  *RADIOLOGY REPORT*  Clinical Data: Central abdominal pain and vomiting.  ACUTE ABDOMEN SERIES (ABDOMEN 2 VIEW & CHEST 1 VIEW)  Comparison: Abdomen 08/05/2011, chest 12/24/2011, and CT abdomen and pelvis 12/24/2011.  Findings: Normal heart size and pulmonary vascularity.  Mild coarse parenchymal opacities in the lung bases suggest fibrosis. Scattered calcified granulomas in the left lung with calcified left hilar lymph nodes.  No focal airspace consolidation.  No blunting of costophrenic angles.  No pneumothorax.  Calcification of the aorta.  No significant change since previous chest radiograph.  Thoracolumbar scoliosis with postoperative changes at L3-4 region. Surgical clips in the right upper quadrant.  Scattered gas and stool in the colon.  Scattered gas in normal caliber small bowel. No small or large bowel distension.  No free intra-abdominal air. No abnormal air fluid levels.  No radiopaque stones.  Postoperative changes in the right hip.  IMPRESSION: No evidence of active pulmonary disease.  Nonobstructive bowel gas pattern.   Original Report Authenticated By: Marlon Pel, M.D.     Chart has been reviewed  Assessment/Plan  77 year old female with recent history of enteritis comes in with recurrent symptoms may be CT showing improved small bowel involvement but persistent colitis   Present on Admission:  .Enteritis - would  get GI recommendations for now hold off on antibiotics at that has been discontinued in the past and she did well  .Abdominal  pain, other specified site - symptomatic management bowel rest  .Hypertensionn continue home medications -  .Hyperlipidemia - continue  home medications  .Scabies infestation - permethrin cream ordered. All her family contacts needs to be treated contact precautions  .Hyponatremia - likely secondary to dehydration given above mentioned symptoms but we'll check urine electrolytes give IV fluids and followup  .Hematuria - is something that should be followed up as an outpatient to see if there is persistent findings    Prophylaxis: Lovenox, Protonix   Other plan as per orders.  I have spent a total of 55 min on this admission  Betty Jackson 01/24/2012, 4:52 AM

## 2012-01-24 NOTE — Consult Note (Signed)
Reason for Consult: Recurrent abdominal pain, nausea and vomiting. Referring Physician: [facility]-Dr. Susie Cassette.  Betty Jackson is an 77 y.o. female.  HPI: Patient readmitted with recurrent abdominal pain and severe nausea and vomiting. She had similar symptoms last month, when she was seen in consultation by Dr. Russella Dar. She was thought to have ischemic colitis and seemed to improve slowly. She was doing well till 01/22/12 when she started having abdominal pain with nausea and vomiting. She has 3-4 loose stools that day but not since then. She usually has 1-2 BM's per day. She had a CT scan on admission that revealed enteritis/colitis in the distal small bowel ? IBD. She has her last colonoscopy 5 years ago in [place], when a small polyp was removed.    Past Medical History  Diagnosis Date  . Arthritis      Right hemiarthroplasty after hip fracture 06/2011  . Headache   . Hypertension   . Hyperlipidemia   . Incontinence of urine   . GERD (gastroesophageal reflux disease)   . Temporal arteritis     a. followed @ [facility]  . CAD (coronary artery disease)     a. Stent RCA in [facility];  b. Cath approx 2009 - nonobs per pt report.  . Sinus bradycardia     a. on chronic bb  . PAF (paroxysmal atrial fibrillation)     a. lone epidode in 2011 according to notes.  . Depression   . Shortness of breath   . Anemia   . DDD (degenerative disc disease)   . Scoliosis   . Vocal cord dysfunction     Past Surgical History  Procedure Date  . Laparoscopic cholecystectomy 2001  . Tonsillectomy and adenoidectomy   . Abdominal hysterectomy age 59    TAH  . Bladder suspension   . Hemiarthroplasty hip 06/2011    fractured her hip and repaired in [place], [place]  . Coronary angioplasty with stent placement 2006    Family History  Problem Relation Age of Onset  . Cancer Daughter     breast  . Arthritis Neg Hx   . Heart disease Other   . Hypertension Other   . Heart attack Father   . Cancer Mother    Social  History:  reports that she has never smoked. She has never used smokeless tobacco. She reports that she drinks about one ounce of alcohol per week. She reports that she does not use illicit drugs.  Allergies:  Allergies  Allergen Reactions  . Tussionex Pennkinetic Er (Hydrocod Polst-Cpm Polst Er) Hives   Medications: I have reviewed the patient's current medications.  Results for orders placed during the hospital encounter of 01/23/12 (from the past 48 hour(s))  CBC WITH DIFFERENTIAL     Status: Abnormal   Collection Time   01/23/12  5:16 PM      Component Value Range Comment   WBC 8.3  4.0 - 10.5 K/uL    RBC 4.37  3.87 - 5.11 MIL/uL    Hemoglobin 11.8 (*) 12.0 - 15.0 g/dL    HCT 96.0 (*) 45.4 - 46.0 %    MCV 78.5  78.0 - 100.0 fL    MCH 27.0  26.0 - 34.0 pg    MCHC 34.4  30.0 - 36.0 g/dL    RDW 09.8  11.9 - 14.7 %    Platelets 204  150 - 400 K/uL    Neutrophils Relative 75  43 - 77 %    Neutro Abs 6.2  1.7 - 7.7 K/uL    Lymphocytes Relative 16  12 - 46 %    Lymphs Abs 1.4  0.7 - 4.0 K/uL    Monocytes Relative 8  3 - 12 %    Monocytes Absolute 0.7  0.1 - 1.0 K/uL    Eosinophils Relative 0  0 - 5 %    Eosinophils Absolute 0.0  0.0 - 0.7 K/uL    Basophils Relative 0  0 - 1 %    Basophils Absolute 0.0  0.0 - 0.1 K/uL   COMPREHENSIVE METABOLIC PANEL     Status: Abnormal   Collection Time   01/23/12  5:16 PM      Component Value Range Comment   Sodium 126 (*) 135 - 145 mEq/L    Potassium 3.0 (*) 3.5 - 5.1 mEq/L    Chloride 88 (*) 96 - 112 mEq/L    CO2 25  19 - 32 mEq/L    Glucose, Bld 93  70 - 99 mg/dL    BUN 8  6 - 23 mg/dL    Creatinine, Ser 1.61  0.50 - 1.10 mg/dL    Calcium 9.3  8.4 - 09.6 mg/dL    Total Protein 7.5  6.0 - 8.3 g/dL    Albumin 4.0  3.5 - 5.2 g/dL    AST 20  0 - 37 U/L    ALT 13  0 - 35 U/L    Alkaline Phosphatase 125 (*) 39 - 117 U/L    Total Bilirubin 0.8  0.3 - 1.2 mg/dL    GFR calc non Af Amer >90  >90 mL/min    GFR calc Af Amer >90  >90 mL/min     LIPASE, BLOOD     Status: Normal   Collection Time   01/23/12  5:16 PM      Component Value Range Comment   Lipase 13  11 - 59 U/L   POCT I-STAT TROPONIN I     Status: Normal   Collection Time   01/23/12  5:37 PM      Component Value Range Comment   Troponin i, poc 0.02  0.00 - 0.08 ng/mL    Comment 3            URINALYSIS, ROUTINE W REFLEX MICROSCOPIC     Status: Abnormal   Collection Time   01/23/12 10:13 PM      Component Value Range Comment   Color, Urine YELLOW  YELLOW    APPearance CLOUDY (*) CLEAR    Specific Gravity, Urine 1.017  1.005 - 1.030    pH 6.5  5.0 - 8.0    Glucose, UA NEGATIVE  NEGATIVE mg/dL    Hgb urine dipstick LARGE (*) NEGATIVE    Bilirubin Urine NEGATIVE  NEGATIVE    Ketones, ur >80 (*) NEGATIVE mg/dL    Protein, ur NEGATIVE  NEGATIVE mg/dL    Urobilinogen, UA 0.2  0.0 - 1.0 mg/dL    Nitrite NEGATIVE  NEGATIVE    Leukocytes, UA NEGATIVE  NEGATIVE   URINE MICROSCOPIC-ADD ON     Status: Abnormal   Collection Time   01/23/12 10:13 PM      Component Value Range Comment   Squamous Epithelial / LPF RARE  RARE    WBC, UA 0-2  <3 WBC/hpf    RBC / HPF 11-20  <3 RBC/hpf    Bacteria, UA RARE  RARE    Casts HYALINE CASTS (*) NEGATIVE    Urine-Other MUCOUS PRESENT  LACTIC ACID, PLASMA     Status: Normal   Collection Time   01/23/12 10:40 PM      Component Value Range Comment   Lactic Acid, Venous 0.8  0.5 - 2.2 mmol/L   MAGNESIUM     Status: Normal   Collection Time   01/24/12  5:40 AM      Component Value Range Comment   Magnesium 1.6  1.5 - 2.5 mg/dL   PHOSPHORUS     Status: Normal   Collection Time   01/24/12  5:40 AM      Component Value Range Comment   Phosphorus 3.0  2.3 - 4.6 mg/dL   TSH     Status: Normal   Collection Time   01/24/12  5:40 AM      Component Value Range Comment   TSH 1.257  0.350 - 4.500 uIU/mL   COMPREHENSIVE METABOLIC PANEL     Status: Abnormal   Collection Time   01/24/12  5:40 AM      Component Value Range  Comment   Sodium 132 (*) 135 - 145 mEq/L    Potassium 3.2 (*) 3.5 - 5.1 mEq/L    Chloride 97  96 - 112 mEq/L    CO2 25  19 - 32 mEq/L    Glucose, Bld 81  70 - 99 mg/dL    BUN 6  6 - 23 mg/dL    Creatinine, Ser 2.44  0.50 - 1.10 mg/dL    Calcium 8.7  8.4 - 01.0 mg/dL    Total Protein 6.4  6.0 - 8.3 g/dL    Albumin 3.3 (*) 3.5 - 5.2 g/dL    AST 16  0 - 37 U/L    ALT 10  0 - 35 U/L    Alkaline Phosphatase 104  39 - 117 U/L    Total Bilirubin 0.6  0.3 - 1.2 mg/dL    GFR calc non Af Amer 87 (*) >90 mL/min    GFR calc Af Amer >90  >90 mL/min   CBC     Status: Abnormal   Collection Time   01/24/12  5:40 AM      Component Value Range Comment   WBC 6.3  4.0 - 10.5 K/uL    RBC 3.89  3.87 - 5.11 MIL/uL    Hemoglobin 11.0 (*) 12.0 - 15.0 g/dL    HCT 27.2 (*) 53.6 - 46.0 %    MCV 78.9  78.0 - 100.0 fL    MCH 28.3  26.0 - 34.0 pg    MCHC 35.8  30.0 - 36.0 g/dL    RDW 64.4  03.4 - 74.2 %    Platelets 187  150 - 400 K/uL   BASIC METABOLIC PANEL     Status: Abnormal   Collection Time   01/24/12 10:48 AM      Component Value Range Comment   Sodium 131 (*) 135 - 145 mEq/L    Potassium 3.7  3.5 - 5.1 mEq/L    Chloride 98  96 - 112 mEq/L    CO2 21  19 - 32 mEq/L    Glucose, Bld 84  70 - 99 mg/dL    BUN 6  6 - 23 mg/dL    Creatinine, Ser 5.95  0.50 - 1.10 mg/dL    Calcium 8.6  8.4 - 63.8 mg/dL    GFR calc non Af Amer 89 (*) >90 mL/min    GFR calc Af Amer >90  >90 mL/min  Ct Abdomen Pelvis W Contrast  01/24/2012  *RADIOLOGY REPORT*  Clinical Data: Epigastric pain with nausea, vomiting, and diarrhea.  CT ABDOMEN AND PELVIS WITH CONTRAST  Technique:  Multidetector CT imaging of the abdomen and pelvis was performed following the standard protocol during bolus administration of intravenous contrast.  Contrast: OMNIPAQUE IOHEXOL 300 MG/ML  SOLN  Comparison: 12/24/2011  Findings: Respiratory motion artifact in the lung bases.  Surgical absence of the colon.  Focal low attenuation change  in the liver adjacent to the falciform ligament likely represents focal fatty infiltration.  This is stable since the previous study.  The pancreas, spleen, adrenal glands, and retroperitoneal lymph nodes are unremarkable.  Small sub centimeter parenchymal cysts in the kidneys.  No hydronephrosis.  Calcification of the abdominal aorta without aneurysm.  The stomach, small bowel, and colon are not abnormally distended.  There is interval improvement since previous study of the previously demonstrated inflammatory changes in the small bowel.  There is some residual thickening of the wall of the colon consistent with residual inflammatory process or colitis. Mild stranding in the pericolonic fat.  No free air or free fluid in the abdomen.  Pelvis:  Visualization of the low pelvis is obstructed by artifact from the right hip arthroplasty.  No free or loculated pelvic fluid collections demonstrated.  Diverticulosis of the sigmoid colon without diverticulitis.  The appendix is normal.  No bladder wall thickening.  Degenerative changes in the lumbar spine and hips. Lumbar scoliosis.  IMPRESSION: Improvement of previously demonstrated inflammatory changes in the small bowel with residual colonic wall thickening suggesting colitis or inflammatory bowel disease.   Original Report Authenticated By: Marlon Pel, M.D.    Dg Abd Acute W/chest  01/23/2012  *RADIOLOGY REPORT*  Clinical Data: Central abdominal pain and vomiting.  ACUTE ABDOMEN SERIES (ABDOMEN 2 VIEW & CHEST 1 VIEW)  Comparison: Abdomen 08/05/2011, chest 12/24/2011, and CT abdomen and pelvis 12/24/2011.  Findings: Normal heart size and pulmonary vascularity.  Mild coarse parenchymal opacities in the lung bases suggest fibrosis. Scattered calcified granulomas in the left lung with calcified left hilar lymph nodes.  No focal airspace consolidation.  No blunting of costophrenic angles.  No pneumothorax.  Calcification of the aorta.  No significant change  since previous chest radiograph.  Thoracolumbar scoliosis with postoperative changes at L3-4 region. Surgical clips in the right upper quadrant.  Scattered gas and stool in the colon.  Scattered gas in normal caliber small bowel. No small or large bowel distension.  No free intra-abdominal air. No abnormal air fluid levels.  No radiopaque stones.  Postoperative changes in the right hip.  IMPRESSION: No evidence of active pulmonary disease.  Nonobstructive bowel gas pattern.   Original Report Authenticated By: Marlon Pel, M.D.     Review of Systems  Constitutional: Positive for malaise/fatigue. Negative for fever, chills, weight loss and diaphoresis.  HENT: Negative for hearing loss, ear pain, nosebleeds, congestion, sore throat, tinnitus and ear discharge.   Eyes: Negative.   Respiratory: Negative.  Negative for stridor.   Cardiovascular: Negative.   Gastrointestinal: Positive for heartburn, nausea, vomiting, abdominal pain and diarrhea. Negative for blood in stool and melena.  Genitourinary: Negative.   Musculoskeletal: Positive for myalgias, back pain and joint pain.  Skin: Positive for itching and rash.  Neurological: Positive for weakness and headaches. Negative for dizziness, tingling and tremors.  Psychiatric/Behavioral: Positive for depression. Negative for suicidal ideas and substance abuse. The patient is nervous/anxious.    Blood pressure 146/68,  pulse 72, temperature 98.4 F (36.9 C), temperature source Oral, resp. rate 15, height 5' 4.8" (1.646 m), weight 63.866 kg (140 lb 12.8 oz), SpO2 98.00%. Physical Exam  Constitutional: She is oriented to person, place, and time. She appears well-developed and well-nourished.  HENT:  Head: Normocephalic and atraumatic.  Eyes: Conjunctivae normal and EOM are normal. Pupils are equal, round, and reactive to light.  Neck: Normal range of motion. Neck supple.  Cardiovascular: Normal rate and regular rhythm.   Respiratory: Effort normal  and breath sounds normal.  GI: Soft. Bowel sounds are normal. She exhibits no distension and no mass. There is tenderness. There is no rebound and no guarding.       The abdomen is diffusely tender on palpation with no gaurding, rebound or rigidity  Musculoskeletal: Normal range of motion.  Neurological: She is alert and oriented to person, place, and time.  Skin: Skin is warm and dry.  Psychiatric: She has a normal mood and affect. Her behavior is normal. Judgment and thought content normal.    Assessment/Plan: 1) Abdominal pain with enteritis and colitis on CT: I wanted to schedule the patient for a colonoscopy tomorrow but she has been vomiting even clears. She claims he pain began in the epigastrium; Therefore will plan an EGD tomorrow and a colonoscopy on Tuesday if possible.  2) GERD: On PPI's.  3) Temporal arteritis on Prednisone.  4) HTN/Hyperlipidemia/CAD/History of A.fibrillation.   Sonam Huelsmann 01/24/2012, 12:26 PM

## 2012-01-24 NOTE — Progress Notes (Signed)
Utilization Review Completed.  

## 2012-01-24 NOTE — ED Provider Notes (Signed)
Care assumed from Dr. Freida Busman. Pt with N/V and abdominal pain x 36 h. Afebrile. Dehydrated appearing. Mild ttp over lower abd on my exam. Lactate wnl. CT scan shows improved inflammatory changes of the small bowel with non specific colitis.  Patient is significantly hyponatremic. We will tx with a 2nd bolus of NS. Case discussed with hospitalist who will see and admit.   Brandt Loosen, MD 01/24/12 [phone]

## 2012-01-25 ENCOUNTER — Ambulatory Visit: Payer: Medicare Other | Admitting: Physician Assistant

## 2012-01-25 DIAGNOSIS — I498 Other specified cardiac arrhythmias: Secondary | ICD-10-CM | POA: Diagnosis present

## 2012-01-25 DIAGNOSIS — K5289 Other specified noninfective gastroenteritis and colitis: Principal | ICD-10-CM

## 2012-01-25 DIAGNOSIS — R109 Unspecified abdominal pain: Secondary | ICD-10-CM | POA: Diagnosis not present

## 2012-01-25 DIAGNOSIS — K219 Gastro-esophageal reflux disease without esophagitis: Secondary | ICD-10-CM | POA: Diagnosis not present

## 2012-01-25 DIAGNOSIS — I2510 Atherosclerotic heart disease of native coronary artery without angina pectoris: Secondary | ICD-10-CM | POA: Diagnosis present

## 2012-01-25 DIAGNOSIS — Z79899 Other long term (current) drug therapy: Secondary | ICD-10-CM | POA: Diagnosis not present

## 2012-01-25 DIAGNOSIS — R112 Nausea with vomiting, unspecified: Secondary | ICD-10-CM | POA: Diagnosis not present

## 2012-01-25 DIAGNOSIS — M316 Other giant cell arteritis: Secondary | ICD-10-CM | POA: Diagnosis present

## 2012-01-25 DIAGNOSIS — I10 Essential (primary) hypertension: Secondary | ICD-10-CM | POA: Diagnosis not present

## 2012-01-25 DIAGNOSIS — E785 Hyperlipidemia, unspecified: Secondary | ICD-10-CM | POA: Diagnosis present

## 2012-01-25 DIAGNOSIS — E876 Hypokalemia: Secondary | ICD-10-CM | POA: Diagnosis present

## 2012-01-25 DIAGNOSIS — Z96649 Presence of unspecified artificial hip joint: Secondary | ICD-10-CM | POA: Diagnosis not present

## 2012-01-25 DIAGNOSIS — K648 Other hemorrhoids: Secondary | ICD-10-CM | POA: Diagnosis present

## 2012-01-25 DIAGNOSIS — Z9861 Coronary angioplasty status: Secondary | ICD-10-CM | POA: Diagnosis not present

## 2012-01-25 DIAGNOSIS — R933 Abnormal findings on diagnostic imaging of other parts of digestive tract: Secondary | ICD-10-CM | POA: Diagnosis present

## 2012-01-25 DIAGNOSIS — R319 Hematuria, unspecified: Secondary | ICD-10-CM | POA: Diagnosis present

## 2012-01-25 DIAGNOSIS — K5730 Diverticulosis of large intestine without perforation or abscess without bleeding: Secondary | ICD-10-CM | POA: Diagnosis present

## 2012-01-25 DIAGNOSIS — K644 Residual hemorrhoidal skin tags: Secondary | ICD-10-CM | POA: Diagnosis present

## 2012-01-25 DIAGNOSIS — E871 Hypo-osmolality and hyponatremia: Secondary | ICD-10-CM | POA: Diagnosis present

## 2012-01-25 DIAGNOSIS — Z7982 Long term (current) use of aspirin: Secondary | ICD-10-CM | POA: Diagnosis not present

## 2012-01-25 DIAGNOSIS — Z23 Encounter for immunization: Secondary | ICD-10-CM | POA: Diagnosis not present

## 2012-01-25 DIAGNOSIS — F329 Major depressive disorder, single episode, unspecified: Secondary | ICD-10-CM | POA: Diagnosis present

## 2012-01-25 DIAGNOSIS — B86 Scabies: Secondary | ICD-10-CM | POA: Diagnosis not present

## 2012-01-25 DIAGNOSIS — IMO0002 Reserved for concepts with insufficient information to code with codable children: Secondary | ICD-10-CM | POA: Diagnosis not present

## 2012-01-25 DIAGNOSIS — I4891 Unspecified atrial fibrillation: Secondary | ICD-10-CM | POA: Diagnosis present

## 2012-01-25 LAB — BASIC METABOLIC PANEL
BUN: 5 mg/dL — ABNORMAL LOW (ref 6–23)
CO2: 23 mEq/L (ref 19–32)
Calcium: 8.6 mg/dL (ref 8.4–10.5)
GFR calc non Af Amer: 88 mL/min — ABNORMAL LOW (ref >90)
Glucose, Bld: 71 mg/dL (ref 70–99)
Sodium: 134 mEq/L — ABNORMAL LOW (ref 135–145)

## 2012-01-25 LAB — URINE CULTURE

## 2012-01-25 MED ORDER — BISACODYL 5 MG PO TBEC
5.0000 mg | DELAYED_RELEASE_TABLET | Freq: Three times a day (TID) | ORAL | Status: AC
  Administered 2012-01-25: 5 mg via ORAL
  Filled 2012-01-25: qty 1

## 2012-01-25 MED ORDER — METOCLOPRAMIDE HCL 5 MG/ML IJ SOLN
10.0000 mg | Freq: Once | INTRAMUSCULAR | Status: DC
  Filled 2012-01-25: qty 2

## 2012-01-25 MED ORDER — PEG-KCL-NACL-NASULF-NA ASC-C 100 G PO SOLR
1.0000 | Freq: Once | ORAL | Status: AC
  Administered 2012-01-25: 100 g via ORAL
  Filled 2012-01-25: qty 1

## 2012-01-25 MED ORDER — SODIUM CHLORIDE 0.9 % IV SOLN: INTRAVENOUS | Status: DC

## 2012-01-25 MED ORDER — METOCLOPRAMIDE HCL 5 MG/ML IJ SOLN
10.0000 mg | Freq: Once | INTRAMUSCULAR | Status: AC
  Administered 2012-01-25: 10 mg via INTRAVENOUS
  Filled 2012-01-25: qty 2

## 2012-01-25 NOTE — Progress Notes (Signed)
1800 Moviprep started per order.

## 2012-01-25 NOTE — Progress Notes (Signed)
[facility] PROGRESS NOTE  Betty Jackson ZOX:[id number] DOB: 09/24/1934 DOA: 01/23/2012 PCP: Kristian Covey, MD  Assessment/Plan: Active Problems:  Hypertension  Hyperlipidemia  History of atrial fibrillation  Enteritis  Abdominal  pain, other specified site  Scabies infestation  Hyponatremia  Hematuria    Enteritis - holding off on antibiotics, GI has been consulted for possible EGD colonoscopy tomorrow.  .Abdominal pain, other specified site - symptomatic management bowel rest  .Hypertensionn continue home medications -  .Hyperlipidemia - continue home medications  .Scabies infestation - permethrin cream/Ivermectin  ordered. All her family contacts needs to be treated contact precautions  .Hyponatremia -improving, likely secondary to dehydration given above mentioned symptoms but we'll check urine electrolytes , continue IV fluids  .Hematuria - is something that should be followed up as an outpatient to see if there is persistent findings  Hypokalemia repleted  Code Status: full Family Communication: family updated about patient's clinical progress Disposition Plan:  As above    Brief narrative: Betty Jackson is a 77 y.o. female  has a past medical history of Arthritis; Headache; Hypertension; Hyperlipidemia; Incontinence of urine; GERD (gastroesophageal reflux disease); Temporal arteritis; CAD (coronary artery disease); Sinus bradycardia; PAF (paroxysmal atrial fibrillation); Depression; Shortness of breath; Anemia; DDD (degenerative disc disease); Scoliosis; and Vocal cord dysfunction.  Presented with  Patient has had abdominal pain nausea and vomiting for the past 36 hours. She had had a recent admissions for the same and was diagnosed to enteritis. Initially she was started on IV antibiotics but that has been discontinued  as per GI recommendations. Patient still had not been seen by GI as an outpatient she is scheduled to have an appointment for father workup when his  symptoms have resumed. At this point family states that they're much milder than her prior admission but still worried about an early. Patient does not appear to be toxic. Of note she and her family has recently been diagnosed with scabies.   Consultants:  Percent neurology  Procedures:  None  Antibiotics:  None  HPI/Subjective: Still nauseous  Objective: Filed Vitals:   01/24/12 0935 01/24/12 1354 01/24/12 2202 01/25/12 0540  BP: 146/68 153/71 139/77 146/67  Pulse: 72 76 71 80  Temp: 98.4 F (36.9 C) 98.3 F (36.8 C) 98.5 F (36.9 C) 98.6 F (37 C)  TempSrc: Oral Oral Oral Oral  Resp: 15 16 16 17   Height:      Weight:      SpO2: 98% 97% 99% 95%    Intake/Output Summary (Last 24 hours) at 01/25/12 0838 Last data filed at 01/24/12 2203  Gross per 24 hour  Intake      0 ml  Output    300 ml  Net   -300 ml    Exam:  General: in No Acute distress  2. Psychological: Alert and Oriented  3. Head/ENT: Dry Mucous Membranes  Head Non traumatic, neck supple  Normal Dentition  4. SKIN: decreased Skin turgor, Skin clean Dry, and numerous excoriations noted consistent with scabies affecting her face and hands.  5. Heart: Regular rate and rhythm no Murmur, Rub or gallop  6. Lungs: Clear to auscultation bilaterally, no wheezes or crackles  7. Abdomen: Soft, minimally tender, Non distended  8. Lower extremities: no clubbing, cyanosis, or edema  9. Neurologically Grossly intact, moving all 4 extremities equally  10. MSK: Normal range of motion    Data Reviewed: Basic Metabolic Panel:  Lab 01/25/12 8119 01/24/12 2059 01/24/12 1505 01/24/12 1048 01/24/12 0540  NA  134* 131* 133* 131* 132*  K 3.6 3.6 3.6 3.7 3.2*  CL 101 98 98 98 97  CO2 23 23 24 21 25   GLUCOSE 71 80 95 84 81  BUN 5* 5* 6 6 6   CREATININE 0.57 0.57 0.57 0.56 0.60  CALCIUM 8.6 8.8 8.8 8.6 8.7  MG -- -- -- -- 1.6  PHOS -- -- -- -- 3.0    Liver Function Tests:  Lab 01/24/12 0540 01/23/12 1716  AST  16 20  ALT 10 13  ALKPHOS 104 125*  BILITOT 0.6 0.8  PROT 6.4 7.5  ALBUMIN 3.3* 4.0    Lab 01/23/12 1716  LIPASE 13  AMYLASE --   No results found for this basename: AMMONIA:5 in the last 168 hours  CBC:  Lab 01/24/12 0540 01/23/12 1716  WBC 6.3 8.3  NEUTROABS -- 6.2  HGB 11.0* 11.8*  HCT 30.7* 34.3*  MCV 78.9 78.5  PLT 187 204    Cardiac Enzymes: No results found for this basename: CKTOTAL:5,CKMB:5,CKMBINDEX:5,TROPONINI:5 in the last 168 hours BNP (last 3 results) No results found for this basename: PROBNP:3 in the last 8760 hours   CBG: No results found for this basename: GLUCAP:5 in the last 168 hours  Recent Results (from the past 240 hour(s))  URINE CULTURE     Status: Normal   Collection Time   01/23/12 10:13 PM      Component Value Range Status Comment   Specimen Description URINE, RANDOM   Final    Special Requests NONE   Final    Culture  Setup Time 01/24/2012 02:01   Final    Colony Count NO GROWTH   Final    Culture NO GROWTH   Final    Report Status 01/25/2012 FINAL   Final      Studies: Ct Abdomen Pelvis W Contrast  01/24/2012  *RADIOLOGY REPORT*  Clinical Data: Epigastric pain with nausea, vomiting, and diarrhea.  CT ABDOMEN AND PELVIS WITH CONTRAST  Technique:  Multidetector CT imaging of the abdomen and pelvis was performed following the standard protocol during bolus administration of intravenous contrast.  Contrast: OMNIPAQUE IOHEXOL 300 MG/ML  SOLN  Comparison: 12/24/2011  Findings: Respiratory motion artifact in the lung bases.  Surgical absence of the colon.  Focal low attenuation change in the liver adjacent to the falciform ligament likely represents focal fatty infiltration.  This is stable since the previous study.  The pancreas, spleen, adrenal glands, and retroperitoneal lymph nodes are unremarkable.  Small sub centimeter parenchymal cysts in the kidneys.  No hydronephrosis.  Calcification of the abdominal aorta without aneurysm.  The  stomach, small bowel, and colon are not abnormally distended.  There is interval improvement since previous study of the previously demonstrated inflammatory changes in the small bowel.  There is some residual thickening of the wall of the colon consistent with residual inflammatory process or colitis. Mild stranding in the pericolonic fat.  No free air or free fluid in the abdomen.  Pelvis:  Visualization of the low pelvis is obstructed by artifact from the right hip arthroplasty.  No free or loculated pelvic fluid collections demonstrated.  Diverticulosis of the sigmoid colon without diverticulitis.  The appendix is normal.  No bladder wall thickening.  Degenerative changes in the lumbar spine and hips. Lumbar scoliosis.  IMPRESSION: Improvement of previously demonstrated inflammatory changes in the small bowel with residual colonic wall thickening suggesting colitis or inflammatory bowel disease.   Original Report Authenticated By: Marlon Pel, M.D.  Dg Abd Acute W/chest  01/23/2012  *RADIOLOGY REPORT*  Clinical Data: Central abdominal pain and vomiting.  ACUTE ABDOMEN SERIES (ABDOMEN 2 VIEW & CHEST 1 VIEW)  Comparison: Abdomen 08/05/2011, chest 12/24/2011, and CT abdomen and pelvis 12/24/2011.  Findings: Normal heart size and pulmonary vascularity.  Mild coarse parenchymal opacities in the lung bases suggest fibrosis. Scattered calcified granulomas in the left lung with calcified left hilar lymph nodes.  No focal airspace consolidation.  No blunting of costophrenic angles.  No pneumothorax.  Calcification of the aorta.  No significant change since previous chest radiograph.  Thoracolumbar scoliosis with postoperative changes at L3-4 region. Surgical clips in the right upper quadrant.  Scattered gas and stool in the colon.  Scattered gas in normal caliber small bowel. No small or large bowel distension.  No free intra-abdominal air. No abnormal air fluid levels.  No radiopaque stones.  Postoperative  changes in the right hip.  IMPRESSION: No evidence of active pulmonary disease.  Nonobstructive bowel gas pattern.   Original Report Authenticated By: Marlon Pel, M.D.     Scheduled Meds:   . amLODipine  5 mg Oral Daily  . aspirin EC  81 mg Oral QPM  . atorvastatin  20 mg Oral Daily  . enoxaparin (LOVENOX) injection  40 mg Subcutaneous Q24H  . ivermectin  200 mcg/kg Oral Once  . metoprolol  25 mg Oral BID  . pantoprazole  40 mg Oral Daily  . potassium chloride  10 mEq Intravenous Q1 Hr x 4  . predniSONE  2 mg Oral QODAY  . predniSONE  3 mg Oral QODAY  . sodium chloride  1,000 mL Intravenous Once  . sodium chloride  3 mL Intravenous Q12H   Continuous Infusions:   . sodium chloride    . 0.9 % sodium chloride with kcl 125 mL/hr at 01/24/12 1352  . DISCONTD: sodium chloride 100 mL/hr at 01/24/12 0551  . DISCONTD: 0.9 % NaCl with KCl 40 mEq / L    . DISCONTD: dextrose 5 %-0.9% nacl with kcl      Active Problems:  Hypertension  Hyperlipidemia  History of atrial fibrillation  Enteritis  Abdominal  pain, other specified site  Scabies infestation  Hyponatremia  Hematuria    Time spent: 40 minutes   [facility]  [facility] Pager [phone]. If 8PM-8AM, please contact night-coverage at [email or web address], password [facility] 01/25/2012, 8:38 AM  LOS: 2 days

## 2012-01-25 NOTE — Progress Notes (Signed)
1600 Isolation discontinued per Lilia Pro, IP nurse.

## 2012-01-25 NOTE — Progress Notes (Addendum)
[facility] Daily Rounding Note 01/25/2012, 8:28 AM  SUBJECTIVE:      No further nausea, vomiting.  Has been essentially NPO since yesterday mid-day.  No further abd pain.  Feels better.  Not yet  Hungry.  Feels she could attempt colonoscopy prep  OBJECTIVE:         Vital signs in last 24 hours:    Temp:  [98.3 F (36.8 C)-98.6 F (37 C)] 98.6 F (37 C) (10/28 0540) Pulse Rate:  [71-80] 80  (10/28 0540) Resp:  [15-17] 17  (10/28 0540) BP: (139-153)/(67-77) 146/67 mmHg (10/28 0540) SpO2:  [95 %-99 %] 95 % (10/28 0540) Last BM Date: 01/24/12 General: looks well, a bit frail. Heart: RRR.  No MRG CHest: clear B.  No SOB or cough.  Abdomen: soft, NT, ND.  No HSM.  No mass.  BS hypoactive.   Extremities: no pedal edema Neuro/Psych:  Pleasant, not agitated or disoriented.   Intake/Output from previous day: 10/27 0701 - 10/28 0700 In: -  Out: 300 [Urine:300]  Intake/Output this shift:    Lab Results:  Basename 01/24/12 0540 01/23/12 1716  WBC 6.3 8.3  HGB 11.0* 11.8*  HCT 30.7* 34.3*  PLT 187 204   BMET  Basename 01/25/12 0300 01/24/12 2059 01/24/12 1505  NA 134* 131* 133*  K 3.6 3.6 3.6  CL 101 98 98  CO2 23 23 24   GLUCOSE 71 80 95  BUN 5* 5* 6  CREATININE 0.57 0.57 0.57  CALCIUM 8.6 8.8 8.8   LFT  Basename 01/24/12 0540 01/23/12 1716  PROT 6.4 7.5  ALBUMIN 3.3* 4.0  AST 16 20  ALT 10 13  ALKPHOS 104 125*  BILITOT 0.6 0.8  BILIDIR -- --  IBILI -- --    Studies/Results: Ct Abdomen Pelvis W Contrast 01/24/2012    IMPRESSION: Improvement of previously demonstrated inflammatory changes in the small bowel with residual colonic wall thickening suggesting colitis or inflammatory bowel disease.   Original Report Authenticated By: Marlon Pel, M.D.    Dg Abd Acute W/chest 01/23/2012   IMPRESSION: No evidence of active pulmonary disease.  Nonobstructive bowel gas pattern.   Original Report Authenticated By: Marlon Pel, M.D.     ASSESMENT: *  Recurrent abd pain, N/V.  CT scan 10/27 with improving SB enteritis, residual colonic wall thickening. Rule out IBD.    Enteritis of distal SB late 11/2011.  CT 12/24/11: acute enteritis distal SB. Prior incidence of 4 weeks duration, but dissimilar, right abd pain with 07/30/11 CT:  mildly increased caliber of intra/extra hepatic ducts, 8.7 mm CBD, periportal edema with normal small bowel and colon. Colonoscopy 2009 in [place], possibly had polyps then.  *  Minor elevation Alk Phos at 125 10/26, current and prior levels normal and LFTs normal.  No CT evidence of biliary disease, s/p lap chole.  *  Scabies.  On contact precautions and ivermectin po.  *  Cardiac stent 2006.  Does not require Plavix.  *  06/2011 hip hemiarthroplasty post fracture.  *  Temporal arteritis, chronic low dose Prednisone.   *  Chronic bradycardia.  *  Hx vocal cord dysfunction.  May have had EGD then.    PLAN: *  Colonoscopy/EGDat 1315 on 10/29.  Am  Cancelling EGD set for today.  *  movi  Prep, dulcolax, reglan, clears orders written for prep. Stop Lovenox (DVT prophylaxis) after 10 AM shot today.  Cancelled outpt GI work-in appt set for today.  Left GI visit with Dr Russella Dar for 11/6 in place   LOS: 2 days   Jennye Moccasin  01/25/2012, 8:28 AM Pager: [phone]    I have taken an interval history, reviewed the chart and examined the patient. I agree with Lyn Records note, impression and recommendations. Symptoms all resolving. Colon/EGD for tomorrow.  Venita Lick. Russella Dar MD [facility]

## 2012-01-26 ENCOUNTER — Encounter (HOSPITAL_COMMUNITY): Payer: Self-pay

## 2012-01-26 ENCOUNTER — Encounter (HOSPITAL_COMMUNITY)
Admission: EM | Disposition: A | Discharge status: Home / Self Care | Payer: Self-pay | Source: Ambulatory Visit | Attending: Internal Medicine

## 2012-01-26 DIAGNOSIS — K219 Gastro-esophageal reflux disease without esophagitis: Secondary | ICD-10-CM | POA: Diagnosis not present

## 2012-01-26 DIAGNOSIS — K5289 Other specified noninfective gastroenteritis and colitis: Secondary | ICD-10-CM | POA: Diagnosis not present

## 2012-01-26 DIAGNOSIS — R109 Unspecified abdominal pain: Secondary | ICD-10-CM | POA: Diagnosis not present

## 2012-01-26 DIAGNOSIS — R933 Abnormal findings on diagnostic imaging of other parts of digestive tract: Secondary | ICD-10-CM

## 2012-01-26 DIAGNOSIS — I10 Essential (primary) hypertension: Secondary | ICD-10-CM | POA: Diagnosis not present

## 2012-01-26 HISTORY — PX: COLONOSCOPY: SHX5424

## 2012-01-26 HISTORY — PX: ESOPHAGOGASTRODUODENOSCOPY: SHX5428

## 2012-01-26 LAB — BASIC METABOLIC PANEL
BUN: 3 mg/dL — ABNORMAL LOW (ref 6–23)
Chloride: 104 mEq/L (ref 96–112)
Creatinine, Ser: 0.59 mg/dL (ref 0.50–1.10)
GFR calc non Af Amer: 87 mL/min — ABNORMAL LOW (ref >90)
Glucose, Bld: 94 mg/dL (ref 70–99)
Potassium: 3.5 mEq/L (ref 3.5–5.1)

## 2012-01-26 SURGERY — EGD (ESOPHAGOGASTRODUODENOSCOPY)
Anesthesia: Moderate Sedation

## 2012-01-26 SURGERY — COLONOSCOPY
Anesthesia: Moderate Sedation

## 2012-01-26 MED ORDER — FENTANYL CITRATE 0.05 MG/ML IJ SOLN
INTRAMUSCULAR | Status: AC
  Filled 2012-01-26: qty 2

## 2012-01-26 MED ORDER — MIDAZOLAM HCL 5 MG/ML IJ SOLN
INTRAMUSCULAR | Status: AC
  Filled 2012-01-26: qty 2

## 2012-01-26 MED ORDER — BUTAMBEN-TETRACAINE-BENZOCAINE 2-2-14 % EX AERO
INHALATION_SPRAY | CUTANEOUS | Status: DC | PRN
  Administered 2012-01-26: 2 via TOPICAL

## 2012-01-26 MED ORDER — FENTANYL CITRATE 0.05 MG/ML IJ SOLN
INTRAMUSCULAR | Status: DC | PRN
  Administered 2012-01-26 (×4): 25 ug via INTRAVENOUS

## 2012-01-26 MED ORDER — DIPHENHYDRAMINE HCL 50 MG/ML IJ SOLN
INTRAMUSCULAR | Status: AC
  Filled 2012-01-26: qty 1

## 2012-01-26 MED ORDER — MIDAZOLAM HCL 5 MG/5ML IJ SOLN
INTRAMUSCULAR | Status: DC | PRN
  Administered 2012-01-26: 1 mg via INTRAVENOUS
  Administered 2012-01-26 (×4): 2 mg via INTRAVENOUS

## 2012-01-26 NOTE — Interval H&P Note (Signed)
History and Physical Interval Note:  01/26/2012 1:09 PM  Betty Jackson  has presented today for surgery, with the diagnosis of abnormal  small bowel and colon on recent CT .  having bouts of abd pain and nausea, vomiting.  The various methods of treatment have been discussed with the patient and family. After consideration of risks, benefits and other options for treatment, the patient has consented to  Procedure(s) (LRB) with comments: COLONOSCOPY (N/A) - note the EGD is possible ESOPHAGOGASTRODUODENOSCOPY (EGD) (N/A) as a surgical intervention .  The patient's history has been reviewed, patient examined, no change in status, stable for surgery.  I have reviewed the patient's chart and labs.  Questions were answered to the patient's satisfaction.     Venita Lick. Russella Dar MD [facility]

## 2012-01-26 NOTE — Progress Notes (Signed)
     [facility] Daily Rounding Note 01/26/2012, 8:24 AM  SUBJECTIVE:       No n/v with prep.  Completed this and stools clear  OBJECTIVE:         Vital signs in last 24 hours:    Temp:  [98 F (36.7 C)-98.7 F (37.1 C)] 98.4 F (36.9 C) (10/29 0433) Pulse Rate:  [59-84] 67  (10/29 0433) Resp:  [18] 18  (10/29 0433) BP: (119-148)/(53-76) 147/76 mmHg (10/29 0433) SpO2:  [95 %-99 %] 95 % (10/29 0433) Last BM Date: 01/25/12 General: looks well.  Did not reexamine   Pulm:  No dyspnea. Neuro/Psych:  Pleasant, relaxed.  Not confused   Lab Results:  Basename 01/26/12 0553 01/25/12 0300 01/24/12 2059  NA 138 134* 131*  K 3.5 3.6 3.6  CL 104 101 98  CO2 23 23 23  GLUCOSE 94 71 80  BUN 3* 5* 5*  CREATININE 0.59 0.57 0.57  CALCIUM 9.2 8.6 8.8    ASSESMENT: * Recurrent abd pain, N/V. CT scan 10/27 with improving SB enteritis, residual colonic wall thickening. Rule out IBD.  Enteritis of distal SB late 11/2011. CT 12/24/11: acute enteritis distal SB. Prior incidence of 4 weeks duration, but dissimilar, right abd pain with 07/30/11 CT: mildly increased caliber of intra/extra hepatic ducts, 8.7 mm CBD, periportal edema with normal small bowel and colon.  Colonoscopy 2009 in [place], possibly had polyps then.  Tolerated  Colon prep without trouble. * Scabies. On contact precautions and ivermectin po.  * Cardiac stent 2006. Does not require Plavix.  * 06/2011 hip hemiarthroplasty post fracture.  * Temporal arteritis, chronic low dose Prednisone.  * Chronic bradycardia.    PLAN: *  Colonoscopy , possible EGD today.    LOS: 3 days   Sarah Gribbin  01/26/2012, 8:24 AM Pager: [phone]   I have taken an interval history, reviewed the chart and examined the patient. I agree with Sarah Gribbin's note, impression and recommendations. Colonoscopy, possible EGD today.   Malcolm T. Stark MD FACG 

## 2012-01-26 NOTE — Progress Notes (Signed)
Quick Note:  Labs will be faxed to Sigurd Sos at [phone] ______

## 2012-01-26 NOTE — Op Note (Signed)
[facility] [address] [place], [address]   ENDOSCOPY PROCEDURE REPORT  PATIENT: Betty Jackson, Betty Jackson  MR#: [id number] BIRTHDATE: 10/30/1934 , 77  yrs. old GENDER: Female ENDOSCOPIST: Meryl Dare, MD, [facility] REFERRED BY:  [facility] PROCEDURE DATE:  01/26/2012 PROCEDURE:  EGD, diagnostic ASA CLASS:     Class II INDICATIONS:  abdominal pain. MEDICATIONS: residual sedation effect present from prior procedure, medications were titrated to patient response per physician's verbal order: Versed 2 mg IV TOPICAL ANESTHETIC: Cetacaine Spray DESCRIPTION OF PROCEDURE: After the risks benefits and alternatives of the procedure were thoroughly explained, informed consent was obtained.  The Pentax Gastroscope [id number] endoscope was introduced through the mouth and advanced to the second portion of the duodenum. Without limitations.  The instrument was slowly withdrawn as the mucosa was fully examined.  ESOPHAGUS: The mucosa of the esophagus appeared normal. STOMACH: The mucosa of the stomach appeared normal.  The gastric folds were normal. DUODENUM: The duodenal mucosa showed no abnormalities in the bulb and second portion of the duodenum. Retroflexed views revealed a small hiatal hernia.  The scope was then withdrawn from the patient and the procedure completed.  COMPLICATIONS: There were no complications. ENDOSCOPIC IMPRESSION: 1.   Small hiatal hernia 2.   Otherwise normal EGD  RECOMMENDATIONS: 1.  anti-reflux regimen 2.  continue PPI    eSigned:  Meryl Dare, MD, [facility] 01/26/2012 2:20 PM

## 2012-01-26 NOTE — Op Note (Signed)
[facility] [address] [place], [address]   COLONOSCOPY PROCEDURE REPORT  PATIENT: Betty, Jackson  MR#: [id number] BIRTHDATE: [birth year] , 77  yrs. old GENDER: Female ENDOSCOPIST: Meryl Dare, MD, [facility] PROCEDURE DATE:  01/26/2012 PROCEDURE:   Colonoscopy, diagnostic ASA CLASS:   Class II INDICATIONS:an abnormal CT and abdominal pain. MEDICATIONS: medications were titrated to patient response per physician's verbal order, Fentanyl 100 mcg IV, and Versed 7 mg IV DESCRIPTION OF PROCEDURE:   After the risks benefits and alternatives of the procedure were thoroughly explained, informed consent was obtained.  A digital rectal exam revealed external hemorrhoids.   The Pentax Ped Colon [id number]  endoscope was introduced through the anus and advanced to the terminal ileum which was intubated for a short distance. No adverse events experienced.   The quality of the prep was good, using MoviPrep The instrument was then slowly withdrawn as the colon was fully examined.  COLON FINDINGS: Mild diverticulosis was noted in the sigmoid colon and descending colon.   The colon was otherwise normal.  There was no diverticulosis, inflammation, polyps or cancers unless previously stated.  Retroflexed views revealed moderate internal hemorrhoids. The time to cecum=3 minutes 00 seconds.  Withdrawal time=11 minutes 00 seconds.  The scope was withdrawn and the procedure completed.  COMPLICATIONS: There were no complications.  ENDOSCOPIC IMPRESSION: 1.   Mild diverticulosis was noted in the sigmoid colon and descending colon 2.   Moderate internal and external hemorrhoids 3.   Normal terminal ileum  RECOMMENDATIONS: High fiber diet with liberal fluid intake.   eSigned:  Meryl Dare, MD, [facility] 01/26/2012 2:05 PM

## 2012-01-26 NOTE — H&P (View-Only) (Signed)
     [facility] Daily Rounding Note 01/26/2012, 8:24 AM  SUBJECTIVE:       No n/v with prep.  Completed this and stools clear  OBJECTIVE:         Vital signs in last 24 hours:    Temp:  [98 F (36.7 C)-98.7 F (37.1 C)] 98.4 F (36.9 C) (10/29 0433) Pulse Rate:  [59-84] 67  (10/29 0433) Resp:  [18] 18  (10/29 0433) BP: (119-148)/(53-76) 147/76 mmHg (10/29 0433) SpO2:  [95 %-99 %] 95 % (10/29 0433) Last BM Date: 01/25/12 General: looks well.  Did not reexamine   Pulm:  No dyspnea. Neuro/Psych:  Pleasant, relaxed.  Not confused   Lab Results:  Basename 01/26/12 0553 01/25/12 0300 01/24/12 2059  NA 138 134* 131*  K 3.5 3.6 3.6  CL 104 101 98  CO2 23 23 23   GLUCOSE 94 71 80  BUN 3* 5* 5*  CREATININE 0.59 0.57 0.57  CALCIUM 9.2 8.6 8.8    ASSESMENT: * Recurrent abd pain, N/V. CT scan 10/27 with improving SB enteritis, residual colonic wall thickening. Rule out IBD.  Enteritis of distal SB late 11/2011. CT 12/24/11: acute enteritis distal SB. Prior incidence of 4 weeks duration, but dissimilar, right abd pain with 07/30/11 CT: mildly increased caliber of intra/extra hepatic ducts, 8.7 mm CBD, periportal edema with normal small bowel and colon.  Colonoscopy 2009 in [place], possibly had polyps then.  Tolerated  Colon prep without trouble. * Scabies. On contact precautions and ivermectin po.  * Cardiac stent 2006. Does not require Plavix.  * 06/2011 hip hemiarthroplasty post fracture.  * Temporal arteritis, chronic low dose Prednisone.  * Chronic bradycardia.    PLAN: *  Colonoscopy , possible EGD today.    LOS: 3 days   Jennye Moccasin  01/26/2012, 8:24 AM Pager: [phone]   I have taken an interval history, reviewed the chart and examined the patient. I agree with Lyn Records note, impression and recommendations. Colonoscopy, possible EGD today.   Venita Lick. Russella Dar MD [facility]

## 2012-01-26 NOTE — Progress Notes (Signed)
[facility] PROGRESS NOTE  Betty Jackson ZOX:[id number] DOB: 1934/11/21 DOA: 01/23/2012 PCP: Kristian Covey, MD  Assessment/Plan: Active Problems:  Hypertension  Hyperlipidemia  History of atrial fibrillation  Enteritis  Abdominal  pain, other specified site  Scabies infestation  Hyponatremia  Hematuria  Nonspecific (abnormal) findings on radiological and other examination of gastrointestinal tract    Enteritis - holding off on antibiotics, GI has been consulted for possible EGD colonoscopy today  .Abdominal pain, other specified site - symptomatic management bowel rest  .Hypertensionn continue home medications -  .Hyperlipidemia - continue home medications  .Scabies infestation - permethrin cream/Ivermectin ordered. All her family contacts needs to be treated contact precautions  .Hyponatremia -improving, likely secondary to dehydration given above mentioned symptoms but we'll check urine electrolytes , continue IV fluids  .Hematuria - is something that should be followed up as an outpatient to see if there is persistent findings  Hypokalemia repleted   Code Status: full  Family Communication: family updated about patient's clinical progress  Disposition Plan: As above    Brief narrative:  Betty Jackson is a 77 y.o. female  has a past medical history of Arthritis; Headache; Hypertension; Hyperlipidemia; Incontinence of urine; GERD (gastroesophageal reflux disease); Temporal arteritis; CAD (coronary artery disease); Sinus bradycardia; PAF (paroxysmal atrial fibrillation); Depression; Shortness of breath; Anemia; DDD (degenerative disc disease); Scoliosis; and Vocal cord dysfunction.  Presented with  Patient has had abdominal pain nausea and vomiting for the past 36 hours. She had had a recent admissions for the same and was diagnosed to enteritis. Initially she was started on IV antibiotics but that has been discontinued  as per GI recommendations. Patient still had not been  seen by GI as an outpatient she is scheduled to have an appointment for father workup when his symptoms have resumed. At this point family states that they're much milder than her prior admission but still worried about an early. Patient does not appear to be toxic. Of note she and her family has recently been diagnosed with scabies.  Consultants:  Percent neurology Procedures:  None Antibiotics:  None HPI/Subjective:  Still nauseous  Objective: Filed Vitals:   01/25/12 2130 01/25/12 2203 01/26/12 0207 01/26/12 0433  BP: 148/76  131/66 147/76  Pulse: 68 78 59 67  Temp: 98.3 F (36.8 C)  98.4 F (36.9 C) 98.4 F (36.9 C)  TempSrc: Oral  Oral Oral  Resp: 18  18 18   Height:      Weight:      SpO2: 96%  96% 95%    Intake/Output Summary (Last 24 hours) at 01/26/12 1045 Last data filed at 01/26/12 0827  Gross per 24 hour  Intake    840 ml  Output    700 ml  Net    140 ml    Exam:  HENT:  Head: Atraumatic.  Nose: Nose normal.  Mouth/Throat: Oropharynx is clear and moist.  Eyes: Conjunctivae are normal. Pupils are equal, round, and reactive to light. No scleral icterus.  Neck: Neck supple. No tracheal deviation present.  Cardiovascular: Normal rate, regular rhythm, normal heart sounds and intact distal pulses.  Pulmonary/Chest: Effort normal and breath sounds normal. No respiratory distress.  Abdominal: Soft. Normal appearance and bowel sounds are normal. She exhibits no distension. There is no tenderness.  Musculoskeletal: She exhibits no edema and no tenderness.  Neurological: She is alert. No cranial nerve deficit.    Data Reviewed: Basic Metabolic Panel:  Lab 01/26/12 8119 01/25/12 0300 01/24/12 2059 01/24/12 1505 01/24/12 1048  01/24/12 0540  NA 138 134* 131* 133* 131* --  K 3.5 3.6 3.6 3.6 3.7 --  CL 104 101 98 98 98 --  CO2 23 23 23 24 21  --  GLUCOSE 94 71 80 95 84 --  BUN 3* 5* 5* 6 6 --  CREATININE 0.59 0.57 0.57 0.57 0.56 --  CALCIUM 9.2 8.6 8.8 8.8 8.6 --   MG -- -- -- -- -- 1.6  PHOS -- -- -- -- -- 3.0    Liver Function Tests:  Lab 01/24/12 0540 01/23/12 1716  AST 16 20  ALT 10 13  ALKPHOS 104 125*  BILITOT 0.6 0.8  PROT 6.4 7.5  ALBUMIN 3.3* 4.0    Lab 01/23/12 1716  LIPASE 13  AMYLASE --   No results found for this basename: AMMONIA:5 in the last 168 hours  CBC:  Lab 01/24/12 0540 01/23/12 1716  WBC 6.3 8.3  NEUTROABS -- 6.2  HGB 11.0* 11.8*  HCT 30.7* 34.3*  MCV 78.9 78.5  PLT 187 204    Cardiac Enzymes: No results found for this basename: CKTOTAL:5,CKMB:5,CKMBINDEX:5,TROPONINI:5 in the last 168 hours BNP (last 3 results) No results found for this basename: PROBNP:3 in the last 8760 hours   CBG: No results found for this basename: GLUCAP:5 in the last 168 hours  Recent Results (from the past 240 hour(s))  URINE CULTURE     Status: Normal   Collection Time   01/23/12 10:13 PM      Component Value Range Status Comment   Specimen Description URINE, RANDOM   Final    Special Requests NONE   Final    Culture  Setup Time 01/24/2012 02:01   Final    Colony Count NO GROWTH   Final    Culture NO GROWTH   Final    Report Status 01/25/2012 FINAL   Final      Studies: Ct Abdomen Pelvis W Contrast  01/24/2012  *RADIOLOGY REPORT*  Clinical Data: Epigastric pain with nausea, vomiting, and diarrhea.  CT ABDOMEN AND PELVIS WITH CONTRAST  Technique:  Multidetector CT imaging of the abdomen and pelvis was performed following the standard protocol during bolus administration of intravenous contrast.  Contrast: OMNIPAQUE IOHEXOL 300 MG/ML  SOLN  Comparison: 12/24/2011  Findings: Respiratory motion artifact in the lung bases.  Surgical absence of the colon.  Focal low attenuation change in the liver adjacent to the falciform ligament likely represents focal fatty infiltration.  This is stable since the previous study.  The pancreas, spleen, adrenal glands, and retroperitoneal lymph nodes are unremarkable.  Small sub  centimeter parenchymal cysts in the kidneys.  No hydronephrosis.  Calcification of the abdominal aorta without aneurysm.  The stomach, small bowel, and colon are not abnormally distended.  There is interval improvement since previous study of the previously demonstrated inflammatory changes in the small bowel.  There is some residual thickening of the wall of the colon consistent with residual inflammatory process or colitis. Mild stranding in the pericolonic fat.  No free air or free fluid in the abdomen.  Pelvis:  Visualization of the low pelvis is obstructed by artifact from the right hip arthroplasty.  No free or loculated pelvic fluid collections demonstrated.  Diverticulosis of the sigmoid colon without diverticulitis.  The appendix is normal.  No bladder wall thickening.  Degenerative changes in the lumbar spine and hips. Lumbar scoliosis.  IMPRESSION: Improvement of previously demonstrated inflammatory changes in the small bowel with residual colonic wall thickening suggesting colitis  or inflammatory bowel disease.   Original Report Authenticated By: Marlon Pel, M.D.    Dg Abd Acute W/chest  01/23/2012  *RADIOLOGY REPORT*  Clinical Data: Central abdominal pain and vomiting.  ACUTE ABDOMEN SERIES (ABDOMEN 2 VIEW & CHEST 1 VIEW)  Comparison: Abdomen 08/05/2011, chest 12/24/2011, and CT abdomen and pelvis 12/24/2011.  Findings: Normal heart size and pulmonary vascularity.  Mild coarse parenchymal opacities in the lung bases suggest fibrosis. Scattered calcified granulomas in the left lung with calcified left hilar lymph nodes.  No focal airspace consolidation.  No blunting of costophrenic angles.  No pneumothorax.  Calcification of the aorta.  No significant change since previous chest radiograph.  Thoracolumbar scoliosis with postoperative changes at L3-4 region. Surgical clips in the right upper quadrant.  Scattered gas and stool in the colon.  Scattered gas in normal caliber small bowel. No small  or large bowel distension.  No free intra-abdominal air. No abnormal air fluid levels.  No radiopaque stones.  Postoperative changes in the right hip.  IMPRESSION: No evidence of active pulmonary disease.  Nonobstructive bowel gas pattern.   Original Report Authenticated By: Marlon Pel, M.D.     Scheduled Meds:   . amLODipine  5 mg Oral Daily  . aspirin EC  81 mg Oral QPM  . atorvastatin  20 mg Oral Daily  . bisacodyl  5 mg Oral Q8H  . metoCLOPramide (REGLAN) injection  10 mg Intravenous Once  . metoCLOPramide (REGLAN) injection  10 mg Intravenous Once  . metoprolol  25 mg Oral BID  . pantoprazole  40 mg Oral Daily  . peg 3350 powder  1 kit Oral Once  . predniSONE  2 mg Oral QODAY  . predniSONE  3 mg Oral QODAY  . sodium chloride  1,000 mL Intravenous Once  . sodium chloride  3 mL Intravenous Q12H   Continuous Infusions:   . sodium chloride    . sodium chloride    . 0.9 % sodium chloride with kcl 20 mL/hr at 01/25/12 2022    Active Problems:  Hypertension  Hyperlipidemia  History of atrial fibrillation  Enteritis  Abdominal  pain, other specified site  Scabies infestation  Hyponatremia  Hematuria  Nonspecific (abnormal) findings on radiological and other examination of gastrointestinal tract    Time spent: 40 minutes   [facility]  [facility] Pager [phone]. If 8PM-8AM, please contact night-coverage at [email or web address], password [facility] 01/26/2012, 10:45 AM  LOS: 3 days

## 2012-01-27 ENCOUNTER — Other Ambulatory Visit: Payer: Self-pay | Admitting: Family Medicine

## 2012-01-27 ENCOUNTER — Encounter (HOSPITAL_COMMUNITY): Payer: Self-pay | Admitting: Gastroenterology

## 2012-01-27 ENCOUNTER — Encounter (HOSPITAL_COMMUNITY): Payer: Self-pay

## 2012-01-27 DIAGNOSIS — R109 Unspecified abdominal pain: Secondary | ICD-10-CM | POA: Diagnosis not present

## 2012-01-27 DIAGNOSIS — I10 Essential (primary) hypertension: Secondary | ICD-10-CM | POA: Diagnosis not present

## 2012-01-27 DIAGNOSIS — K219 Gastro-esophageal reflux disease without esophagitis: Secondary | ICD-10-CM | POA: Diagnosis not present

## 2012-01-27 DIAGNOSIS — K5289 Other specified noninfective gastroenteritis and colitis: Secondary | ICD-10-CM | POA: Diagnosis not present

## 2012-01-27 LAB — BASIC METABOLIC PANEL
Calcium: 9.1 mg/dL (ref 8.4–10.5)
Chloride: 102 mEq/L (ref 96–112)
Creatinine, Ser: 0.65 mg/dL (ref 0.50–1.10)
GFR calc Af Amer: >90 mL/min (ref >90)
GFR calc non Af Amer: 85 mL/min — ABNORMAL LOW (ref >90)

## 2012-01-27 LAB — CBC
MCHC: 35.3 g/dL (ref 30.0–36.0)
Platelets: 215 10*3/uL (ref 150–400)
RDW: 13.6 % (ref 11.5–15.5)
WBC: 7 10*3/uL (ref 4.0–10.5)

## 2012-01-27 MED ORDER — INFLUENZA VIRUS VACC SPLIT PF IM SUSP
0.5000 mL | INTRAMUSCULAR | Status: AC
  Administered 2012-01-27: 0.5 mL via INTRAMUSCULAR
  Filled 2012-01-27: qty 0.5

## 2012-01-27 MED ORDER — POTASSIUM CHLORIDE CRYS ER 20 MEQ PO TBCR: 60.0000 meq | EXTENDED_RELEASE_TABLET | Freq: Once | ORAL | Status: DC

## 2012-01-27 MED ORDER — IVERMECTIN 3 MG PO TABS: 200.0000 ug/kg | ORAL_TABLET | Freq: Once | ORAL | Status: DC

## 2012-01-27 MED ORDER — PERMETHRIN 5 % EX CREA: TOPICAL_CREAM | Freq: Every day | CUTANEOUS | Status: DC

## 2012-01-27 NOTE — Progress Notes (Signed)
Nsg Discharge Note  Admit Date:  01/23/2012 Discharge date: 01/27/2012   Juliannah Ohmann to be D/C'd Home per MD order.  AVS completed.  Copy for chart, and copy for patient signed, and dated. Patient/caregiver able to verbalize understanding.  Discharge Medication:  Kaelah, Hayashi  Home Medication Instructions [id number]   Printed on:01/27/12 1215  Medication Information                    losartan-hydrochlorothiazide (HYZAAR) 50-12.5 MG per tablet Take 1 tablet by mouth daily.             Nutritional Supplements (JUICE PLUS FIBRE PO) Take 1 capsule by mouth daily.           cholecalciferol (VITAMIN D) 1000 UNITS tablet Take 1,000 Units by mouth 2 (two) times daily.            calcium carbonate (OS-CAL) 600 MG TABS Take 600 mg by mouth daily.           amLODipine (NORVASC) 5 MG tablet Take 5 mg by mouth daily.           predniSONE (DELTASONE) 1 MG tablet Take 2-3 mg by mouth See admin instructions. 2mg  on even days  3mg  on odd days           vitamin C (ASCORBIC ACID) 500 MG tablet Take 1,000 mg by mouth daily as needed. For sore throat           atorvastatin (LIPITOR) 20 MG tablet Take 20 mg by mouth daily.           ALPRAZolam (XANAX) 0.5 MG tablet Take 1 tablet (0.5 mg total) by mouth at bedtime as needed for sleep.           HYDROcodone-acetaminophen (VICODIN) 5-500 MG per tablet Take 1 tablet by mouth every 6 (six) hours as needed for pain.           metoprolol (LOPRESSOR) 50 MG tablet Take 25 mg by mouth 2 (two) times daily.            aspirin EC 81 MG tablet Take 81 mg by mouth every evening.           omeprazole (PRILOSEC) 20 MG capsule Take 20 mg by mouth daily.           ivermectin (STROMECTOL) 3 MG TABS Take 4.5 tablets (13,500 mcg total) by mouth once. Take it on 02/07/2012.           permethrin (ELIMITE) 5 % cream Apply topically daily. Apply to whole body on 01/31/2012             Discharge Assessment: Filed Vitals:   01/27/12 0927  BP: 156/73    Pulse: 72  Temp: 98.6 F (37 C)  Resp: 20   Skin clean, dry and intact without evidence of skin break down, no evidence of skin tears noted. BIL elbows are red with foam dressings. Has some scabs from scabies spots. IV catheter discontinued intact. Site without signs and symptoms of complications - no redness or edema noted at insertion site, patient denies c/o pain - only slight tenderness at site.  Dressing with slight pressure applied.  D/c Instructions-Education: Discharge instructions given to patient/family with verbalized understanding. D/c education completed with patient/family including follow up instructions, medication list, d/c activities limitations if indicated, with other d/c instructions as indicated by MD - patient able to verbalize understanding, all questions fully answered. Patient instructed to return to ED, call  911, or call MD for any changes in condition.  Patient escorted via WC, and D/C home via private auto.  Sim Choquette Consuella Lose, RN 01/27/2012 12:15 PM

## 2012-01-27 NOTE — Plan of Care (Signed)
Problem: Phase III Progression Outcomes Goal: Other Phase III Outcomes/Goals Patient given information from [email or web address] for scabies

## 2012-01-27 NOTE — Discharge Summary (Signed)
Physician Discharge Summary  Betty Jackson BJY:[id number] DOB: Dec 21, 1934 DOA: 01/23/2012  PCP: Kristian Covey, MD  Admit date: 01/23/2012 Discharge date: 01/27/2012  Time spent: 40 minutes  Recommendations for Outpatient Follow-up:  1. Followup with primary care  Discharge Diagnoses:  Active Problems:  Hypertension  Hyperlipidemia  History of atrial fibrillation  Enteritis  Abdominal  pain, other specified site  Scabies infestation  Hyponatremia  Hematuria  Nonspecific (abnormal) findings on radiological and other examination of gastrointestinal tract   Discharge Condition: Stable  Diet recommendation: High-fiber diet with liberal fluid intake  Filed Weights   01/24/12 0515  Weight: 63.866 kg (140 lb 12.8 oz)    History of present illness:  Patient readmitted with recurrent abdominal pain and severe nausea and vomiting. She had similar symptoms last month, when she was seen in consultation by Dr. Russella Dar. She was thought to have ischemic colitis and seemed to improve slowly. She was doing well till 01/22/12 when she started having abdominal pain with nausea and vomiting. She has 3-4 loose stools that day but not since then. She usually has 1-2 BM's per day. She had a CT scan on admission that revealed enteritis/colitis in the distal small bowel ? IBD. She has her last colonoscopy 5 years ago in [place], when a small polyp was removed.   Hospital Course:    1. Enteritis: The patient came in to the hospital with abdominal pain, patient was started empirically on antibiotics. She was seen by gastroenterology and EGD/colonoscopy was done. EEG showed small hiatal hernia, colonoscopy mild diverticulosis. But no evidence of mucosal abnormalities. Antibiotics was discontinued, patient diet was advanced and she tolerated that well.  2. Abdominal pain: Likely secondary to #1, this is symptomatically treated with antiemetics and pain medications. This is resolved by now, patient is  tolerating her diet well without any problems.  3. Scabies infestation: Patient reported that she had a family member (daughter) has scabies and she does have some reddish rash. Patient treated with 5% permethrin and ivermectin. Patient to repeat the permethrin in 1 week and ever maxon in 2 weeks.  4. Hyponatremia: This is likely secondary to dehydration from nausea and vomiting. IV fluids was started at the time of admission, this is resolved completely.  5. Hematuria: Patient has hematuria from before, this is been a persistent finding. She does have history of continued disease in she follows with [facility]. Not sure this is related to her vasculitis. Renal function is normal  6. History of temporal arthritis: Patient follows with [facility], this is been stable she is on steroids this is being continued.  Procedures:  EGD: Done by Dr. Russella Dar on 01/26/2012 showed a small hiatal hernia and otherwise normal EGD.  Colonoscopy: Done by Dr. Russella Dar on 01/26/2012 showed mild diverticulosis and moderate internal and external hemorrhoids.  Consultations:  Gastroenterology  Discharge Exam: Filed Vitals:   01/26/12 2116 01/26/12 2229 01/27/12 0527 01/27/12 0927  BP: 172/81 170/72 143/65 156/73  Pulse: 75 72 58 72  Temp: 98.4 F (36.9 C)  97.6 F (36.4 C) 98.6 F (37 C)  TempSrc: Oral  Oral Oral  Resp: 18  18 20   Height:      Weight:      SpO2: 96%  96% 99%   General: Alert and awake, oriented x3, not in any acute distress. HEENT: anicteric sclera, pupils reactive to light and accommodation, EOMI CVS: S1-S2 clear, no murmur rubs or gallops Chest: clear to auscultation bilaterally, no wheezing, rales or rhonchi Abdomen:  soft nontender, nondistended, normal bowel sounds, no organomegaly Extremities: no cyanosis, clubbing or edema noted bilaterally Neuro: Cranial nerves II-XII intact, no focal neurological deficits  Discharge Instructions      Discharge Orders    Future  Appointments: Provider: Department: Dept Phone: Center:   02/03/2012 2:00 PM Meryl Dare, MD,FACG [facility] [phone] [facility]   02/04/2012 9:30 AM Kristian Covey, MD [facility] [phone] [facility]   02/18/2012 8:30 AM Kristian Covey, MD [facility] [phone] [facility]     Future Orders Please Complete By Expires   Diet high fiber      Increase activity slowly          Medication List     As of 01/27/2012 11:21 AM    TAKE these medications         ALPRAZolam 0.5 MG tablet   Commonly known as: XANAX   Take 1 tablet (0.5 mg total) by mouth at bedtime as needed for sleep.      amLODipine 5 MG tablet   Commonly known as: NORVASC   Take 5 mg by mouth daily.      aspirin EC 81 MG tablet   Take 81 mg by mouth every evening.      atorvastatin 20 MG tablet   Commonly known as: LIPITOR   Take 20 mg by mouth daily.      calcium carbonate 600 MG Tabs   Commonly known as: OS-CAL   Take 600 mg by mouth daily.      cholecalciferol 1000 UNITS tablet   Commonly known as: VITAMIN D   Take 1,000 Units by mouth 2 (two) times daily.      HYDROcodone-acetaminophen 5-500 MG per tablet   Commonly known as: VICODIN   Take 1 tablet by mouth every 6 (six) hours as needed for pain.      ivermectin 3 MG Tabs   Commonly known as: STROMECTOL   Take 4.5 tablets (13,500 mcg total) by mouth once. Take it on 02/07/2012.      JUICE PLUS FIBRE PO   Take 1 capsule by mouth daily.      losartan-hydrochlorothiazide 50-12.5 MG per tablet   Commonly known as: HYZAAR   Take 1 tablet by mouth daily.      metoprolol 50 MG tablet   Commonly known as: LOPRESSOR   Take 25 mg by mouth 2 (two) times daily.      omeprazole 20 MG capsule   Commonly known as: PRILOSEC   Take 20 mg by mouth daily.      permethrin 5 % cream   Commonly known as: ELIMITE   Apply topically daily. Apply to whole body on 01/31/2012      predniSONE 1 MG tablet   Commonly known as:  DELTASONE   Take 2-3 mg by mouth See admin instructions. 2mg  on even days  3mg  on odd days      vitamin C 500 MG tablet   Commonly known as: ASCORBIC ACID   Take 1,000 mg by mouth daily as needed. For sore throat        Follow-up Information    Follow up with Kristian Covey, MD. In 1 day.   Contact information:   [address] [place] [address] [phone]           The results of significant diagnostics from this hospitalization (including imaging, microbiology, ancillary and laboratory) are listed below for reference.    Significant Diagnostic Studies: Ct Abdomen Pelvis W Contrast  01/24/2012  *RADIOLOGY REPORT*  Clinical Data: Epigastric pain with nausea, vomiting, and diarrhea.  CT ABDOMEN AND PELVIS WITH CONTRAST  Technique:  Multidetector CT imaging of the abdomen and pelvis was performed following the standard protocol during bolus administration of intravenous contrast.  Contrast: OMNIPAQUE IOHEXOL 300 MG/ML  SOLN  Comparison: 12/24/2011  Findings: Respiratory motion artifact in the lung bases.  Surgical absence of the colon.  Focal low attenuation change in the liver adjacent to the falciform ligament likely represents focal fatty infiltration.  This is stable since the previous study.  The pancreas, spleen, adrenal glands, and retroperitoneal lymph nodes are unremarkable.  Small sub centimeter parenchymal cysts in the kidneys.  No hydronephrosis.  Calcification of the abdominal aorta without aneurysm.  The stomach, small bowel, and colon are not abnormally distended.  There is interval improvement since previous study of the previously demonstrated inflammatory changes in the small bowel.  There is some residual thickening of the wall of the colon consistent with residual inflammatory process or colitis. Mild stranding in the pericolonic fat.  No free air or free fluid in the abdomen.  Pelvis:  Visualization of the low pelvis is obstructed by artifact from  the right hip arthroplasty.  No free or loculated pelvic fluid collections demonstrated.  Diverticulosis of the sigmoid colon without diverticulitis.  The appendix is normal.  No bladder wall thickening.  Degenerative changes in the lumbar spine and hips. Lumbar scoliosis.  IMPRESSION: Improvement of previously demonstrated inflammatory changes in the small bowel with residual colonic wall thickening suggesting colitis or inflammatory bowel disease.   Original Report Authenticated By: Marlon Pel, M.D.    Dg Abd Acute W/chest  01/23/2012  *RADIOLOGY REPORT*  Clinical Data: Central abdominal pain and vomiting.  ACUTE ABDOMEN SERIES (ABDOMEN 2 VIEW & CHEST 1 VIEW)  Comparison: Abdomen 08/05/2011, chest 12/24/2011, and CT abdomen and pelvis 12/24/2011.  Findings: Normal heart size and pulmonary vascularity.  Mild coarse parenchymal opacities in the lung bases suggest fibrosis. Scattered calcified granulomas in the left lung with calcified left hilar lymph nodes.  No focal airspace consolidation.  No blunting of costophrenic angles.  No pneumothorax.  Calcification of the aorta.  No significant change since previous chest radiograph.  Thoracolumbar scoliosis with postoperative changes at L3-4 region. Surgical clips in the right upper quadrant.  Scattered gas and stool in the colon.  Scattered gas in normal caliber small bowel. No small or large bowel distension.  No free intra-abdominal air. No abnormal air fluid levels.  No radiopaque stones.  Postoperative changes in the right hip.  IMPRESSION: No evidence of active pulmonary disease.  Nonobstructive bowel gas pattern.   Original Report Authenticated By: Marlon Pel, M.D.     Microbiology: Recent Results (from the past 240 hour(s))  URINE CULTURE     Status: Normal   Collection Time   01/23/12 10:13 PM      Component Value Range Status Comment   Specimen Description URINE, RANDOM   Final    Special Requests NONE   Final    Culture  Setup  Time 01/24/2012 02:01   Final    Colony Count NO GROWTH   Final    Culture NO GROWTH   Final    Report Status 01/25/2012 FINAL   Final      Labs: Basic Metabolic Panel:  Lab 01/27/12 1610 01/26/12 0553 01/25/12 0300 01/24/12 2059 01/24/12 1505 01/24/12 0540  NA 135 138 134* 131* 133* --  K 3.3*  3.5 3.6 3.6 3.6 --  CL 102 104 101 98 98 --  CO2 26 23 23 23 24  --  GLUCOSE 93 94 71 80 95 --  BUN 3* 3* 5* 5* 6 --  CREATININE 0.65 0.59 0.57 0.57 0.57 --  CALCIUM 9.1 9.2 8.6 8.8 8.8 --  MG -- -- -- -- -- 1.6  PHOS -- -- -- -- -- 3.0   Liver Function Tests:  Lab 01/24/12 0540 01/23/12 1716  AST 16 20  ALT 10 13  ALKPHOS 104 125*  BILITOT 0.6 0.8  PROT 6.4 7.5  ALBUMIN 3.3* 4.0    Lab 01/23/12 1716  LIPASE 13  AMYLASE --   No results found for this basename: AMMONIA:5 in the last 168 hours CBC:  Lab 01/27/12 0650 01/24/12 0540 01/23/12 1716  WBC 7.0 6.3 8.3  NEUTROABS -- -- 6.2  HGB 11.8* 11.0* 11.8*  HCT 33.4* 30.7* 34.3*  MCV 79.9 78.9 78.5  PLT 215 187 204   Cardiac Enzymes: No results found for this basename: CKTOTAL:5,CKMB:5,CKMBINDEX:5,TROPONINI:5 in the last 168 hours BNP: BNP (last 3 results) No results found for this basename: PROBNP:3 in the last 8760 hours CBG: No results found for this basename: GLUCAP:5 in the last 168 hours     Signed:  [facility] A  [facility] 01/27/2012, 11:21 AM

## 2012-01-27 NOTE — Care Management Note (Signed)
    Page 1 of 1   01/27/2012     12:35:57 PM   CARE MANAGEMENT NOTE 01/27/2012  Patient:  Betty Jackson   Account Number:  [id number]  Date Initiated:  01/27/2012  Documentation initiated by:  Letha Cape  Subjective/Objective Assessment:   dx enteritis, diverticulitis  admit- lives with spouse, pta independent.     Action/Plan:   Anticipated DC Date:  01/27/2012   Anticipated DC Plan:  HOME/SELF CARE      DC Planning Services  CM consult      Choice offered to / List presented to:             Status of service:  Completed, signed off Medicare Important Message given?   (If response is "NO", the following Medicare IM given date fields will be blank) Date Medicare IM given:   Date Additional Medicare IM given:    Discharge Disposition:  HOME/SELF CARE  Per UR Regulation:  Reviewed for med. necessity/level of care/duration of stay  If discussed at Long Length of Stay Meetings, dates discussed:    Comments:  01/27/12 12:35 Letha Cape RN, BSN [phone] patient lives with spouse, pta independent.  Patient has medication c overage and transportation.  No needs identified.  Patient for dc today.

## 2012-01-28 ENCOUNTER — Encounter: Payer: Self-pay | Admitting: Family Medicine

## 2012-01-28 ENCOUNTER — Ambulatory Visit (INDEPENDENT_AMBULATORY_CARE_PROVIDER_SITE_OTHER): Payer: Medicare Other | Admitting: Family Medicine

## 2012-01-28 VITALS — BP 140/70 | Temp 97.8°F | Wt 140.0 lb

## 2012-01-28 DIAGNOSIS — K5289 Other specified noninfective gastroenteritis and colitis: Secondary | ICD-10-CM

## 2012-01-28 DIAGNOSIS — B86 Scabies: Secondary | ICD-10-CM | POA: Diagnosis not present

## 2012-01-28 DIAGNOSIS — E876 Hypokalemia: Secondary | ICD-10-CM

## 2012-01-28 DIAGNOSIS — R52 Pain, unspecified: Secondary | ICD-10-CM

## 2012-01-28 DIAGNOSIS — K529 Noninfective gastroenteritis and colitis, unspecified: Secondary | ICD-10-CM

## 2012-01-28 DIAGNOSIS — R109 Unspecified abdominal pain: Secondary | ICD-10-CM

## 2012-01-28 DIAGNOSIS — E871 Hypo-osmolality and hyponatremia: Secondary | ICD-10-CM | POA: Diagnosis not present

## 2012-01-28 MED ORDER — ONDANSETRON 8 MG PO TBDP: 8.0000 mg | ORAL_TABLET | Freq: Three times a day (TID) | ORAL | Status: DC | PRN

## 2012-01-28 MED ORDER — POTASSIUM CHLORIDE CRYS ER 20 MEQ PO TBCR: 20.0000 meq | EXTENDED_RELEASE_TABLET | Freq: Every day | ORAL | Status: DC

## 2012-01-28 NOTE — Patient Instructions (Signed)
Get back on daily potassium.

## 2012-01-28 NOTE — Progress Notes (Signed)
Subjective:    Patient ID: Betty Jackson, female    DOB: Jan 08, 1935, 77 y.o.   MRN: [id number]  HPI  Patient seen for hospital followup. Refer to prior notes. She had recent hospitalization for acute enteritis of uncertain etiology. She had just been 01/22/2012 it was feeling extremely well with no nausea /vomiting or abdominal pain. But later that afternoon and especially that night she had recurrence of abdominal pain associated with nausea and vomiting and eventual loose stools. She was readmitted on 10/26 through 01/27/2012. Colonoscopy revealed mild diverticulosis. No evidence for inflammatory bowel disease. EGD small hiatal hernia otherwise normal. Patient was admitted with hyponatremia 126 and potassium 3.0. Symptomatically improved with hydration and anti-medics. She is doing well this time with no recurrent symptoms. CT abdomen pelvis revealed question of colitis distal small bowel.  Patient's discharge potassium 3.3. She takes losartan HCTZ and is currently not taking potassium replacement. Her appetite is returning. No further loose stools.  She had scabies presumably during hospitalization. Treated with topical Elimite and also took ivermectin. Symptoms are improving.  Past Medical History  Diagnosis Date  . Arthritis      Right hemiarthroplasty after hip fracture 06/2011  . Headache   . Hypertension   . Hyperlipidemia   . Incontinence of urine   . GERD (gastroesophageal reflux disease)   . Temporal arteritis     a. followed @ [facility]  . CAD (coronary artery disease)     a. Stent RCA in [facility];  b. Cath approx 2009 - nonobs per pt report.  . Sinus bradycardia     a. on chronic bb  . PAF (paroxysmal atrial fibrillation)     a. lone epidode in 2011 according to notes.  . Depression   . Shortness of breath   . Anemia   . DDD (degenerative disc disease)   . Scoliosis   . Vocal cord dysfunction    Past Surgical History  Procedure Date  . Laparoscopic cholecystectomy  2001  . Tonsillectomy and adenoidectomy   . Abdominal hysterectomy age 27    TAH  . Bladder suspension   . Hemiarthroplasty hip 06/2011    fractured her hip and repaired in [place], [place]  . Coronary angioplasty with stent placement 2006  . Colonoscopy 01/26/2012    Procedure: COLONOSCOPY;  Surgeon: Meryl Dare, MD,FACG;  Location: [facility] ENDOSCOPY;  Service: Endoscopy;  Laterality: N/A;  note the EGD is possible  . Esophagogastroduodenoscopy 01/26/2012    Procedure: ESOPHAGOGASTRODUODENOSCOPY (EGD);  Surgeon: Meryl Dare, MD,FACG;  Location[facility] END[facility]COPY;  Service: Endoscopy;  Laterality: N/A;    reports that she has never smoked. She has never used smokeless tobacco. She reports that she drinks about one ounce of alcohol per week. She reports that she does not use illicit drugs. family history includes Cancer in her daughter and mother; Heart attack in her father; Heart disease in her other; and Hypertension in her other.  There is no history of Arthritis. Allergies  Allergen Reactions  . Tussionex Pennkinetic Er (Hydrocod Polst-Cpm Polst Er) Hives      Review of Systems  Constitutional: Negative for fever, chills and appetite change.  Respiratory: Negative for shortness of breath.   Cardiovascular: Negative for chest pain.  Gastrointestinal: Negative for nausea, vomiting, abdominal pain, diarrhea and blood in stool.  Neurological: Negative for dizziness.       Objective:   Physical Exam  Constitutional: She appears well-developed and well-nourished.  HENT:  Mouth/Throat: Oropharynx is clear and  moist.  Cardiovascular: Normal rate and regular rhythm.   Pulmonary/Chest: Effort normal and breath sounds normal. No respiratory distress. She has no wheezes.  Abdominal: Soft. Bowel sounds are normal. She exhibits no distension and no mass. There is no tenderness. There is no rebound and no guarding.          Assessment & Plan:  #1 recent recurrent abdominal pain of  uncertain etiology. Question enteritis. Workup as above unrevealing. Prescription for Zofran if she has a recurrent nausea/ vomiting and followup promptly. #2 recent hyponatremia and hypokalemia. Get back on potassium replacement. Recheck basic metabolic panel at followup in 3 weeks.   Suspect this was related to her recent nausea and vomiting. She does take HCTZ.  #3 recent reported presumptive scabies. She will repeat with ivermectin again in one week. Symptoms are improving

## 2012-02-01 DIAGNOSIS — L299 Pruritus, unspecified: Secondary | ICD-10-CM | POA: Diagnosis not present

## 2012-02-01 DIAGNOSIS — B86 Scabies: Secondary | ICD-10-CM | POA: Diagnosis not present

## 2012-02-01 DIAGNOSIS — Z85828 Personal history of other malignant neoplasm of skin: Secondary | ICD-10-CM | POA: Diagnosis not present

## 2012-02-02 ENCOUNTER — Other Ambulatory Visit: Payer: Self-pay | Admitting: *Deleted

## 2012-02-02 MED ORDER — ONDANSETRON 8 MG PO TBDP: 8.0000 mg | ORAL_TABLET | Freq: Three times a day (TID) | ORAL | Status: DC | PRN

## 2012-02-03 ENCOUNTER — Encounter: Payer: Medicare Other | Admitting: Gastroenterology

## 2012-02-03 NOTE — Progress Notes (Signed)
This encounter was created in error - please disregard.

## 2012-02-04 ENCOUNTER — Ambulatory Visit: Payer: Medicare Other | Admitting: Family Medicine

## 2012-02-08 ENCOUNTER — Telehealth: Payer: Self-pay | Admitting: Family Medicine

## 2012-02-08 MED ORDER — GLUCOSE BLOOD VI STRP: ORAL_STRIP | Status: DC

## 2012-02-08 NOTE — Telephone Encounter (Signed)
Medicare holding rx refill  for test strips because dx that provided was not correct.  Per Marolyn Haller at pharmacy pt unable to get filled until Medicare is notified of correct dx and states this must be done verbally.

## 2012-02-08 NOTE — Telephone Encounter (Signed)
Pt tests once daily, diag is temporal arthritis, prednisone orally daily.  Diag code 446.5

## 2012-02-18 ENCOUNTER — Ambulatory Visit: Payer: Medicare Other | Admitting: Family Medicine

## 2012-02-18 ENCOUNTER — Encounter: Payer: Self-pay | Admitting: Family Medicine

## 2012-02-18 ENCOUNTER — Ambulatory Visit (INDEPENDENT_AMBULATORY_CARE_PROVIDER_SITE_OTHER): Payer: Medicare Other | Admitting: Family Medicine

## 2012-02-18 VITALS — BP 142/68 | Temp 97.8°F | Wt 142.0 lb

## 2012-02-18 DIAGNOSIS — M316 Other giant cell arteritis: Secondary | ICD-10-CM | POA: Diagnosis not present

## 2012-02-18 DIAGNOSIS — E876 Hypokalemia: Secondary | ICD-10-CM | POA: Diagnosis not present

## 2012-02-18 DIAGNOSIS — I10 Essential (primary) hypertension: Secondary | ICD-10-CM

## 2012-02-18 LAB — BASIC METABOLIC PANEL
Chloride: 101 mEq/L (ref 96–112)
GFR: 89.5 mL/min (ref >60.00)
Potassium: 4.4 mEq/L (ref 3.5–5.1)
Sodium: 138 mEq/L (ref 135–145)

## 2012-02-18 NOTE — Patient Instructions (Addendum)
Monitor blood pressure and be in touch if consistently > 140/90.   

## 2012-02-18 NOTE — Progress Notes (Signed)
  Subjective:    Patient ID: Betty Jackson, female    DOB: Jul 27, 1934, 77 y.o.   MRN: [id number]  HPI  Patient seen for medical followup. She had 2 episodes of fairly severe enteritis involving hospitalization. She's had none since last discharge. Recent potassium 3.3. She remains on amlodipine and losartan HCTZ for hypertension. Not monitoring blood pressure regularly at home. No headaches. No dizziness. No chest pains. No peripheral edema. No recurrent abdominal pain. Good appetite. Still battling with scabies issue. She has seen dermatologist and has had apparently 2 treatments with topical and oral medication.  Using appropriate measures to reduce scabies with linens, etc.   Review of Systems  Constitutional: Negative for fatigue.  Eyes: Negative for visual disturbance.  Respiratory: Negative for cough, chest tightness, shortness of breath and wheezing.   Cardiovascular: Negative for chest pain, palpitations and leg swelling.  Neurological: Negative for dizziness, seizures, syncope, weakness, light-headedness and headaches.       Objective:   Physical Exam  Constitutional: She appears well-developed and well-nourished.  Cardiovascular: Normal rate and regular rhythm.  Exam reveals no gallop.   Pulmonary/Chest: Effort normal and breath sounds normal. No respiratory distress. She has no wheezes. She has no rales.  Musculoskeletal: She exhibits no edema.          Assessment & Plan:  #1 hypertension. Marginal control. 142/68 by my check today. Continue close monitoring.  #2 hypokalemia. Recent potassium 3.3. Potassium replacement currently. Recheck basic metabolic panel  #3 history of recent enteritis. Currently stable.

## 2012-02-19 ENCOUNTER — Other Ambulatory Visit: Payer: Medicare Other

## 2012-02-22 ENCOUNTER — Telehealth: Payer: Self-pay | Admitting: Family Medicine

## 2012-02-22 NOTE — Progress Notes (Signed)
Quick Note:  Pt informed ______ 

## 2012-02-22 NOTE — Addendum Note (Signed)
Addended by: Rita Ohara R on: 02/22/2012 04:27 PM   Modules accepted: Orders

## 2012-02-22 NOTE — Telephone Encounter (Addendum)
Pt was in hospital and evidently had scabies prior to being admitted. Pt went to dermatologist and was prescribed a cream but its not working. Pt has tried to call the dermatologist, but has not gotten a call back. Pt is itching really bad. Pt req a different med. Req a call back from [place]. [facility] in [place].

## 2012-02-23 LAB — HIGH SENSITIVITY CRP: CRP, High Sensitivity: 4.16 mg/dL (ref 0.000–5.000)

## 2012-02-23 LAB — SEDIMENTATION RATE: Sed Rate: 27 mm/hr — ABNORMAL HIGH (ref 0–22)

## 2012-02-23 NOTE — Telephone Encounter (Signed)
Pt informed on home VM asking if she had heard back from the dermatology office re: her concerns.  Asked her to call office back just to inform if she has been taken care of.

## 2012-02-24 NOTE — Telephone Encounter (Signed)
I have not heard back from pt yet, will close phone note

## 2012-03-02 DIAGNOSIS — L299 Pruritus, unspecified: Secondary | ICD-10-CM | POA: Diagnosis not present

## 2012-03-02 DIAGNOSIS — L259 Unspecified contact dermatitis, unspecified cause: Secondary | ICD-10-CM | POA: Diagnosis not present

## 2012-03-02 DIAGNOSIS — R21 Rash and other nonspecific skin eruption: Secondary | ICD-10-CM | POA: Diagnosis not present

## 2012-03-11 DIAGNOSIS — L259 Unspecified contact dermatitis, unspecified cause: Secondary | ICD-10-CM | POA: Diagnosis not present

## 2012-03-14 ENCOUNTER — Other Ambulatory Visit: Payer: Self-pay | Admitting: Family Medicine

## 2012-03-15 DIAGNOSIS — B86 Scabies: Secondary | ICD-10-CM | POA: Diagnosis not present

## 2012-03-16 DIAGNOSIS — Z1231 Encounter for screening mammogram for malignant neoplasm of breast: Secondary | ICD-10-CM | POA: Diagnosis not present

## 2012-03-16 DIAGNOSIS — Z803 Family history of malignant neoplasm of breast: Secondary | ICD-10-CM | POA: Diagnosis not present

## 2012-04-05 DIAGNOSIS — M316 Other giant cell arteritis: Secondary | ICD-10-CM | POA: Diagnosis not present

## 2012-04-05 DIAGNOSIS — Z79899 Other long term (current) drug therapy: Secondary | ICD-10-CM | POA: Diagnosis not present

## 2012-04-08 ENCOUNTER — Other Ambulatory Visit: Payer: Self-pay | Admitting: Family Medicine

## 2012-04-11 DIAGNOSIS — R928 Other abnormal and inconclusive findings on diagnostic imaging of breast: Secondary | ICD-10-CM | POA: Diagnosis not present

## 2012-04-12 ENCOUNTER — Encounter: Payer: Self-pay | Admitting: Family Medicine

## 2012-05-03 ENCOUNTER — Other Ambulatory Visit (INDEPENDENT_AMBULATORY_CARE_PROVIDER_SITE_OTHER): Payer: Medicare Other

## 2012-05-03 ENCOUNTER — Telehealth: Payer: Self-pay | Admitting: Gastroenterology

## 2012-05-03 ENCOUNTER — Encounter: Payer: Self-pay | Admitting: Nurse Practitioner

## 2012-05-03 ENCOUNTER — Ambulatory Visit (INDEPENDENT_AMBULATORY_CARE_PROVIDER_SITE_OTHER): Payer: Medicare Other | Admitting: Nurse Practitioner

## 2012-05-03 VITALS — BP 140/80 | HR 74 | Ht 64.0 in | Wt 140.8 lb

## 2012-05-03 DIAGNOSIS — R109 Unspecified abdominal pain: Secondary | ICD-10-CM | POA: Diagnosis not present

## 2012-05-03 DIAGNOSIS — K529 Noninfective gastroenteritis and colitis, unspecified: Secondary | ICD-10-CM

## 2012-05-03 DIAGNOSIS — K5289 Other specified noninfective gastroenteritis and colitis: Secondary | ICD-10-CM

## 2012-05-03 LAB — COMPREHENSIVE METABOLIC PANEL
AST: 27 U/L (ref 0–37)
Albumin: 4.1 g/dL (ref 3.5–5.2)
Alkaline Phosphatase: 104 U/L (ref 39–117)
BUN: 9 mg/dL (ref 6–23)
Calcium: 9.3 mg/dL (ref 8.4–10.5)
Chloride: 96 mEq/L (ref 96–112)
Glucose, Bld: 97 mg/dL (ref 70–99)
Potassium: 4.5 mEq/L (ref 3.5–5.1)
Sodium: 131 mEq/L — ABNORMAL LOW (ref 135–145)
Total Protein: 7.2 g/dL (ref 6.0–8.3)

## 2012-05-03 LAB — CBC WITH DIFFERENTIAL/PLATELET
Basophils Relative: 0.2 % (ref 0.0–3.0)
Eosinophils Absolute: 0 10*3/uL (ref 0.0–0.7)
Eosinophils Relative: 0 % (ref 0.0–5.0)
Lymphocytes Relative: 17.5 % (ref 12.0–46.0)
Monocytes Absolute: 0.5 10*3/uL (ref 0.1–1.0)
Neutrophils Relative %: 76.2 % (ref 43.0–77.0)
Platelets: 199 10*3/uL (ref 150.0–400.0)
RBC: 4.24 Mil/uL (ref 3.87–5.11)
WBC: 8.4 10*3/uL (ref 4.5–10.5)

## 2012-05-03 NOTE — Patient Instructions (Addendum)
You have been scheduled for a CT angiogram at [facility] [address].  Please arrive at 8:45 on 05/05/12.  You need to have nothing to eat or drink after midnight.   Please go to the basement today for ordered lab work.

## 2012-05-03 NOTE — Progress Notes (Signed)
05/03/2012 Betty Jackson [id number] 1934-09-03   History of Present Illness:  Patient is a 78 -year-old female who we saw in September 2013 (Dr. Russella Dar) when hospitalized with acute upper abdominal pain, nausea and vomiting. CT scan showed distal small bowel enteritis.Her symptoms improved with conservative measures. Patient was readmitted late October 2013 with recurrent abdominal pain, nausea and vomiting. Repeat CT scan showed improving small bowel enteritis, residual colonic wall thickening. Colonoscopy that admission revealed mild diverticulosis of the sigmoid and descending colon, moderate internal and external hemorrhoids. Terminal ileum was normal. EGD was done and normal except for small hiatal hernia. She was scheduled for hospital followup appointment 11/6 but did not show. She called the office today with complaints of recurrent abdominal pain which started around 11am today. Pain periumbilical, it has eased off since this morning. She had a bagel at 9am, pain started at 11am, nothing to eat since. No nausea, no diarrhea.   Current Medications, Allergies, Past Medical History, Past Surgical History, Family History and Social History were reviewed in [employer] record.   Physical Exam: General: Well developed , white female in no acute distress Head: Normocephalic and atraumatic Eyes:  sclerae anicteric, conjunctiva pink  Ears: Normal auditory acuity Lungs: Clear throughout to auscultation Heart: Regular rate and rhythm Abdomen: Soft, non tender and non distended. No masses, no hepatomegaly. Normal bowel sounds Musculoskeletal: Symmetrical with no gross deformities  Extremities: No edema  Neurological: Alert oriented x 4, grossly nonfocal Psychological:  Alert and cooperative. Normal mood and affect  Assessment and Recommendations:  1. Recurrent abdominal pain, same as when hospitalized in September and October with enteritis. Abdominal exam is benign. If this is  recurrent enteritis then need to rule out IBD, ischemia or other etiologies. Will obtain CTA of the abdomen as well as labs to look for presence of IBD (Promethius). Will call patient with results. She knows to call us ASAP or go to ED for worsening pain.   2.? Recurrent scabies She completed treatment for scabies in October. Now with some tiny lesions on hands. She will contact PCP.

## 2012-05-03 NOTE — Telephone Encounter (Signed)
Patient reports abdominal pain above the umbilicus that started today. She feels this is consistent to the pain she had when she was admitted in October.  She will come in and see Betty Jackson RNP today at 3:30

## 2012-05-04 DIAGNOSIS — R109 Unspecified abdominal pain: Secondary | ICD-10-CM | POA: Insufficient documentation

## 2012-05-05 ENCOUNTER — Ambulatory Visit (INDEPENDENT_AMBULATORY_CARE_PROVIDER_SITE_OTHER)
Admission: RE | Admit: 2012-05-05 | Discharge: 2012-05-05 | Disposition: A | Discharge status: Home / Self Care | Payer: Medicare Other | Source: Ambulatory Visit | Attending: Nurse Practitioner | Admitting: Nurse Practitioner

## 2012-05-05 DIAGNOSIS — R109 Unspecified abdominal pain: Secondary | ICD-10-CM | POA: Diagnosis not present

## 2012-05-05 DIAGNOSIS — K5289 Other specified noninfective gastroenteritis and colitis: Secondary | ICD-10-CM

## 2012-05-05 DIAGNOSIS — K529 Noninfective gastroenteritis and colitis, unspecified: Secondary | ICD-10-CM

## 2012-05-05 DIAGNOSIS — I700 Atherosclerosis of aorta: Secondary | ICD-10-CM | POA: Diagnosis not present

## 2012-05-05 DIAGNOSIS — I708 Atherosclerosis of other arteries: Secondary | ICD-10-CM | POA: Diagnosis not present

## 2012-05-05 LAB — IBD EXPANDED PANEL
ACCA: 10 units (ref 0–90)
Atypical pANCA: NEGATIVE

## 2012-05-05 MED ORDER — IOHEXOL 350 MG/ML SOLN
100.0000 mL | Freq: Once | INTRAVENOUS | Status: AC | PRN
  Administered 2012-05-05: 100 mL via INTRAVENOUS

## 2012-05-05 NOTE — Progress Notes (Signed)
I agree with plan outlined above.  Unclear etiology of her intermitent pains, h/o SB edema.  Not clear if she has underlying IBD, was that an ishemic event in past, perhaps vasculitis.

## 2012-05-09 ENCOUNTER — Encounter: Payer: Self-pay | Admitting: Family Medicine

## 2012-05-10 ENCOUNTER — Telehealth: Payer: Self-pay | Admitting: Gastroenterology

## 2012-05-10 NOTE — Telephone Encounter (Signed)
Gunnar Fusi please advise results

## 2012-05-15 ENCOUNTER — Other Ambulatory Visit: Payer: Self-pay | Admitting: Family Medicine

## 2012-05-17 ENCOUNTER — Other Ambulatory Visit: Payer: Self-pay | Admitting: Family Medicine

## 2012-05-27 ENCOUNTER — Other Ambulatory Visit: Payer: Self-pay | Admitting: *Deleted

## 2012-05-27 DIAGNOSIS — R109 Unspecified abdominal pain: Secondary | ICD-10-CM

## 2012-05-31 ENCOUNTER — Other Ambulatory Visit: Payer: Medicare Other

## 2012-06-01 NOTE — Progress Notes (Signed)
A CT enterography is recommended as we do not have a cause for her recent problems. Glad she is feeling better but would like to get this last test.

## 2012-06-06 ENCOUNTER — Ambulatory Visit: Payer: Medicare Other | Admitting: Gastroenterology

## 2012-06-07 ENCOUNTER — Other Ambulatory Visit: Payer: Self-pay | Admitting: *Deleted

## 2012-06-07 MED ORDER — LOSARTAN POTASSIUM-HCTZ 50-12.5 MG PO TABS: 1.0000 | ORAL_TABLET | Freq: Every day | ORAL | Status: DC

## 2012-06-17 ENCOUNTER — Ambulatory Visit: Payer: Medicare Other | Admitting: Family Medicine

## 2012-06-17 ENCOUNTER — Telehealth: Payer: Self-pay | Admitting: *Deleted

## 2012-06-17 NOTE — Telephone Encounter (Signed)
Pt was a No Show for 4 month ROV.  Pt forgot, she did not get a reminder phone call.  Pt apologized and is rescheduling.

## 2012-06-29 ENCOUNTER — Ambulatory Visit (INDEPENDENT_AMBULATORY_CARE_PROVIDER_SITE_OTHER): Payer: Medicare Other | Admitting: Family Medicine

## 2012-06-29 ENCOUNTER — Encounter: Payer: Self-pay | Admitting: Family Medicine

## 2012-06-29 VITALS — BP 120/78 | Temp 97.6°F | Wt 137.0 lb

## 2012-06-29 DIAGNOSIS — E785 Hyperlipidemia, unspecified: Secondary | ICD-10-CM | POA: Diagnosis not present

## 2012-06-29 DIAGNOSIS — Z8619 Personal history of other infectious and parasitic diseases: Secondary | ICD-10-CM

## 2012-06-29 DIAGNOSIS — M316 Other giant cell arteritis: Secondary | ICD-10-CM

## 2012-06-29 DIAGNOSIS — G4700 Insomnia, unspecified: Secondary | ICD-10-CM

## 2012-06-29 DIAGNOSIS — F5104 Psychophysiologic insomnia: Secondary | ICD-10-CM

## 2012-06-29 DIAGNOSIS — I10 Essential (primary) hypertension: Secondary | ICD-10-CM

## 2012-06-29 LAB — LIPID PANEL
Cholesterol: 114 mg/dL (ref 0–200)
HDL: 59.6 mg/dL (ref >39.00)
Triglycerides: 55 mg/dL (ref 0.0–149.0)
VLDL: 11 mg/dL (ref 0.0–40.0)

## 2012-06-29 MED ORDER — TEMAZEPAM 30 MG PO CAPS: 30.0000 mg | ORAL_CAPSULE | Freq: Every evening | ORAL | Status: DC | PRN

## 2012-06-29 NOTE — Progress Notes (Signed)
Subjective:    Patient ID: Betty Jackson, female    DOB: 01/25/1935, 78 y.o.   MRN: [id number]  HPI  Patient here for medical followup She had ongoing issues with pruritic skin rash. Question of scabies. Has seen multiple dermatologists including one biopsy which apparently was negative. She's been on multiple treatments which have not worked. Doing reasonably well couple weeks ago when she had recurrent pruritus.  Hyperlipidemia treated with Lipitor. Last lipids were over one year ago. No recent myalgias.  Temporal arteritis. Symptomatically stable. She is followed by rheumatologist at [facility]. Needs followup CRP and sedimentation rate and requesting this today. No headaches.  Chronic insomnia. Patient requesting refill Restoril. Sleep hygiene discussed. She's tried over-the-counter remedies without much success. Hypertension treated with metoprolol, amlodipine, and losartan HCTZ. History of mild hypokalemia in the past. Blood pressure is well controlled. No orthostasis.  Past Medical History  Diagnosis Date  . Arthritis      Right hemiarthroplasty after hip fracture 06/2011  . Headache   . Hypertension   . Hyperlipidemia   . Incontinence of urine   . GERD (gastroesophageal reflux disease)   . Temporal arteritis     a. followed @ [facility]  . CAD (coronary artery disease)     a. Stent RCA in [facility];  b. Cath approx 2009 - nonobs per pt report.  . Sinus bradycardia     a. on chronic bb  . PAF (paroxysmal atrial fibrillation)     a. lone epidode in 2011 according to notes.  . Depression   . Shortness of breath   . Anemia   . DDD (degenerative disc disease)   . Scoliosis   . Vocal cord dysfunction   . Diverticulosis   . Hemorrhoids   . Hiatal hernia   . Enteritis    Past Surgical History  Procedure Laterality Date  . Laparoscopic cholecystectomy  2001  . Tonsillectomy and adenoidectomy    . Abdominal hysterectomy  age 60    TAH  . Bladder suspension    .  Hemiarthroplasty hip  06/2011    fractured her hip and repaired in [place], [place]  . Coronary angioplasty with stent placement  2006  . Colonoscopy  01/26/2012    Procedure: COLONOSCOPY;  Surgeon: Meryl Dare, MD,FACG;  Location: [facility] ENDOSCOPY;  Service: Endoscopy;  Laterality: N/A;  note the EGD is possible  . Esophagogastroduodenoscopy  01/26/2012    Procedure: ESOPHAGOGASTRODUODENOSCOPY (EGD);  Surgeon: Meryl Dare, MD,FACG;  Location: [facility] ENDOSCOPY;  Service: Endoscopy;  Laterality: N/A;    reports that she has never smoked. She has never used smokeless tobacco. She reports that she drinks about 1.0 ounces of alcohol per week. She reports that she does not use illicit drugs. family history includes Cancer in her daughter and mother; Heart attack in her father; Heart disease in her other; and Hypertension in her other.  There is no history of Arthritis. Allergies  Allergen Reactions  . Tussionex Pennkinetic Er (Hydrocod Polst-Cpm Polst Er) Hives      Review of Systems  Constitutional: Negative for fatigue.  Eyes: Negative for visual disturbance.  Respiratory: Negative for cough, chest tightness, shortness of breath and wheezing.   Cardiovascular: Negative for chest pain, palpitations and leg swelling.  Gastrointestinal: Negative for abdominal pain.  Skin: Negative for rash.  Neurological: Negative for dizziness, seizures, syncope, weakness, light-headedness and headaches.       Objective:   Physical Exam  Constitutional: She appears well-developed and well-nourished.  No distress.  Neck: Neck supple. No thyromegaly present.  Cardiovascular: Normal rate and regular rhythm.   Pulmonary/Chest: Effort normal and breath sounds normal. No respiratory distress. She has no wheezes. She has no rales.  Musculoskeletal: She exhibits no edema.          Assessment & Plan:  #1 hypertension. Stable #2 hyperlipidemia. Recheck lipid panel #3 temporal arteritis. Check C-reactive  protein and sed rate #4 chronic insomnia. Sleep hygiene discussed with handout given. Cautious use of Restoril as needed #5 recurrent pruritic skin rash. Questionable history of scabies. Biopsies per derm reportedly negative for scabies.

## 2012-06-29 NOTE — Patient Instructions (Signed)

## 2012-06-30 NOTE — Progress Notes (Signed)
Quick Note:  Pt husband informed, labs faxed to Dr Clovis Riley ______

## 2012-07-05 DIAGNOSIS — M316 Other giant cell arteritis: Secondary | ICD-10-CM | POA: Diagnosis not present

## 2012-07-14 DIAGNOSIS — H2510 Age-related nuclear cataract, unspecified eye: Secondary | ICD-10-CM | POA: Diagnosis not present

## 2012-07-18 ENCOUNTER — Ambulatory Visit (INDEPENDENT_AMBULATORY_CARE_PROVIDER_SITE_OTHER): Payer: Medicare Other | Admitting: Family Medicine

## 2012-07-18 ENCOUNTER — Encounter: Payer: Self-pay | Admitting: Family Medicine

## 2012-07-18 VITALS — BP 140/64 | Temp 98.2°F | Wt 135.0 lb

## 2012-07-18 DIAGNOSIS — M542 Cervicalgia: Secondary | ICD-10-CM

## 2012-07-18 NOTE — Patient Instructions (Addendum)
Continue with Ibuprofen as needed for neck pain. Continue with heat and muscle massage. Follow up promptly for any weakness or progressive pain.

## 2012-07-18 NOTE — Progress Notes (Signed)
Subjective:    Patient ID: Betty Jackson, female    DOB: 07-03-1934, 78 y.o.   MRN: [id number]  HPI R sided neck pain Onset yesterday.  Soreness and 8/10 severity. numbness right 3rd, 4th, and 5th fingers. Massage helped .  Advil helped. NKI.   No past history of significant neck problems  Past Medical History  Diagnosis Date  . Arthritis      Right hemiarthroplasty after hip fracture 06/2011  . Headache   . Hypertension   . Hyperlipidemia   . Incontinence of urine   . GERD (gastroesophageal reflux disease)   . Temporal arteritis     a. followed @ [facility]  . CAD (coronary artery disease)     a. Stent RCA in [facility];  b. Cath approx 2009 - nonobs per pt report.  . Sinus bradycardia     a. on chronic bb  . PAF (paroxysmal atrial fibrillation)     a. lone epidode in 2011 according to notes.  . Depression   . Shortness of breath   . Anemia   . DDD (degenerative disc disease)   . Scoliosis   . Vocal cord dysfunction   . Diverticulosis   . Hemorrhoids   . Hiatal hernia   . Enteritis    Past Surgical History  Procedure Laterality Date  . Laparoscopic cholecystectomy  2001  . Tonsillectomy and adenoidectomy    . Abdominal hysterectomy  age 62    TAH  . Bladder suspension    . Hemiarthroplasty hip  06/2011    fractured her hip and repaired in [place], [place]  . Coronary angioplasty with stent placement  2006  . Colonoscopy  01/26/2012    Procedure: COLONOSCOPY;  Surgeon: Meryl Dare, MD,FACG;  Location: [facility] ENDOSCOPY;  Service: Endoscopy;  Laterality: N/A;  note the EGD is possible  . Esophagogastroduodenoscopy  01/26/2012    Procedure: ESOPHAGOGASTRODUODENOSCOPY (EGD);  Surgeon: Meryl Dare, MD,FACG;  Location: [facility] ENDOSCOPY;  Service: Endoscopy;  Laterality: N/A;    reports that she has never smoked. She has never used smokeless tobacco. She reports that she drinks about 1.0 ounces of alcohol per week. She reports that she does not use illicit drugs. family  history includes Cancer in her daughter and mother; Heart attack in her father; Heart disease in her other; and Hypertension in her other.  There is no history of Arthritis. Allergies  Allergen Reactions  . Tussionex Pennkinetic Er (Hydrocod Polst-Cpm Polst Er) Hives      Review of Systems  Constitutional: Negative for fever, chills, appetite change and unexpected weight change.  HENT: Positive for neck pain.   Neurological: Negative for weakness.       Objective:   Physical Exam  Constitutional: She is oriented to person, place, and time. She appears well-developed and well-nourished.  Cardiovascular: Normal rate and regular rhythm.   Pulmonary/Chest: Effort normal and breath sounds normal. No respiratory distress. She has no wheezes. She has no rales.  Musculoskeletal:  No cervical spine tenderness. She has limited range of motion with lateral bending or rotation to the left or right secondary to pain.  Neurological: She is alert and oriented to person, place, and time.  Full-strength upper extremities. Symmetric upper extremity reflexes with the exception of difficulty right triceps compared to left. No sensory impairment          Assessment & Plan:  Right cervical neck pain. Nonfocal neurologic exam. Continue Advil as needed. Continue moist heat. Consider x-rays if no  better in 2 weeks

## 2012-07-20 DIAGNOSIS — H2510 Age-related nuclear cataract, unspecified eye: Secondary | ICD-10-CM | POA: Diagnosis not present

## 2012-07-25 DIAGNOSIS — H2510 Age-related nuclear cataract, unspecified eye: Secondary | ICD-10-CM | POA: Diagnosis not present

## 2012-07-25 DIAGNOSIS — H269 Unspecified cataract: Secondary | ICD-10-CM | POA: Diagnosis not present

## 2012-08-16 ENCOUNTER — Other Ambulatory Visit: Payer: Self-pay | Admitting: *Deleted

## 2012-08-16 MED ORDER — PREDNISONE 1 MG PO TABS: 2.0000 mg | ORAL_TABLET | ORAL | Status: DC

## 2012-08-22 ENCOUNTER — Other Ambulatory Visit: Payer: Self-pay | Admitting: Family Medicine

## 2012-09-04 ENCOUNTER — Other Ambulatory Visit: Payer: Self-pay | Admitting: Family Medicine

## 2012-09-11 ENCOUNTER — Other Ambulatory Visit: Payer: Self-pay | Admitting: Family Medicine

## 2012-09-12 NOTE — Telephone Encounter (Signed)
Refill once 

## 2012-09-12 NOTE — Telephone Encounter (Signed)
Temazepam prn last filled 06-29-12, #30 with 0 refills

## 2012-09-27 DIAGNOSIS — Z79899 Other long term (current) drug therapy: Secondary | ICD-10-CM | POA: Diagnosis not present

## 2012-09-27 DIAGNOSIS — M316 Other giant cell arteritis: Secondary | ICD-10-CM | POA: Diagnosis not present

## 2012-09-27 DIAGNOSIS — M899 Disorder of bone, unspecified: Secondary | ICD-10-CM | POA: Diagnosis not present

## 2012-09-27 DIAGNOSIS — M949 Disorder of cartilage, unspecified: Secondary | ICD-10-CM | POA: Diagnosis not present

## 2012-10-10 ENCOUNTER — Telehealth: Payer: Self-pay | Admitting: Family Medicine

## 2012-10-10 NOTE — Telephone Encounter (Signed)
I called to Dr Jae Dire Mitchell's office. They have not yet completed the new order. It has not been faxed. Please let pt know. Thanks.

## 2012-10-10 NOTE — Telephone Encounter (Signed)
Informed patient order not completed

## 2012-10-10 NOTE — Telephone Encounter (Signed)
Called week of 10/03/12 to request appointment for lab work needed for [facility] physician, Dr Sigurd Sos, including a sed rate and and protein.  Was told she needs a new order.  Asking if Dr Sigurd Sos has sent order yet and when can have tests done. Please call back.  Will be in office for husband's appointment  Today, 10/10/12, at 1600.

## 2012-10-11 DIAGNOSIS — M25559 Pain in unspecified hip: Secondary | ICD-10-CM | POA: Diagnosis not present

## 2012-10-13 ENCOUNTER — Other Ambulatory Visit: Payer: Self-pay | Admitting: Family Medicine

## 2012-10-13 DIAGNOSIS — M316 Other giant cell arteritis: Secondary | ICD-10-CM

## 2012-10-24 ENCOUNTER — Other Ambulatory Visit (INDEPENDENT_AMBULATORY_CARE_PROVIDER_SITE_OTHER): Payer: Medicare Other

## 2012-10-24 DIAGNOSIS — M316 Other giant cell arteritis: Secondary | ICD-10-CM | POA: Diagnosis not present

## 2012-10-24 LAB — C-REACTIVE PROTEIN: CRP: <0.5 mg/dL (ref 0.5–20.0)

## 2012-10-24 NOTE — Patient Instructions (Signed)
, °

## 2012-10-28 ENCOUNTER — Encounter (HOSPITAL_BASED_OUTPATIENT_CLINIC_OR_DEPARTMENT_OTHER): Payer: Self-pay | Admitting: *Deleted

## 2012-10-28 ENCOUNTER — Inpatient Hospital Stay (HOSPITAL_BASED_OUTPATIENT_CLINIC_OR_DEPARTMENT_OTHER)
Admission: EM | Admit: 2012-10-28 | Discharge: 2012-10-30 | DRG: 066 | Disposition: A | Discharge status: Home / Self Care | Payer: Medicare Other | Attending: Internal Medicine | Admitting: Internal Medicine

## 2012-10-28 ENCOUNTER — Telehealth: Payer: Self-pay | Admitting: Family Medicine

## 2012-10-28 DIAGNOSIS — R109 Unspecified abdominal pain: Secondary | ICD-10-CM

## 2012-10-28 DIAGNOSIS — Z9861 Coronary angioplasty status: Secondary | ICD-10-CM

## 2012-10-28 DIAGNOSIS — M79604 Pain in right leg: Secondary | ICD-10-CM

## 2012-10-28 DIAGNOSIS — G4700 Insomnia, unspecified: Secondary | ICD-10-CM

## 2012-10-28 DIAGNOSIS — M129 Arthropathy, unspecified: Secondary | ICD-10-CM | POA: Diagnosis not present

## 2012-10-28 DIAGNOSIS — D649 Anemia, unspecified: Secondary | ICD-10-CM

## 2012-10-28 DIAGNOSIS — I4891 Unspecified atrial fibrillation: Secondary | ICD-10-CM | POA: Diagnosis present

## 2012-10-28 DIAGNOSIS — E785 Hyperlipidemia, unspecified: Secondary | ICD-10-CM | POA: Diagnosis present

## 2012-10-28 DIAGNOSIS — I10 Essential (primary) hypertension: Secondary | ICD-10-CM

## 2012-10-28 DIAGNOSIS — F411 Generalized anxiety disorder: Secondary | ICD-10-CM

## 2012-10-28 DIAGNOSIS — I6350 Cerebral infarction due to unspecified occlusion or stenosis of unspecified cerebral artery: Principal | ICD-10-CM | POA: Diagnosis present

## 2012-10-28 DIAGNOSIS — R319 Hematuria, unspecified: Secondary | ICD-10-CM

## 2012-10-28 DIAGNOSIS — I2510 Atherosclerotic heart disease of native coronary artery without angina pectoris: Secondary | ICD-10-CM | POA: Diagnosis not present

## 2012-10-28 DIAGNOSIS — I6529 Occlusion and stenosis of unspecified carotid artery: Secondary | ICD-10-CM | POA: Diagnosis not present

## 2012-10-28 DIAGNOSIS — F32A Depression, unspecified: Secondary | ICD-10-CM

## 2012-10-28 DIAGNOSIS — H539 Unspecified visual disturbance: Secondary | ICD-10-CM

## 2012-10-28 DIAGNOSIS — K529 Noninfective gastroenteritis and colitis, unspecified: Secondary | ICD-10-CM

## 2012-10-28 DIAGNOSIS — K219 Gastro-esophageal reflux disease without esophagitis: Secondary | ICD-10-CM | POA: Diagnosis present

## 2012-10-28 DIAGNOSIS — R32 Unspecified urinary incontinence: Secondary | ICD-10-CM | POA: Diagnosis present

## 2012-10-28 DIAGNOSIS — R933 Abnormal findings on diagnostic imaging of other parts of digestive tract: Secondary | ICD-10-CM

## 2012-10-28 DIAGNOSIS — I6789 Other cerebrovascular disease: Secondary | ICD-10-CM | POA: Diagnosis not present

## 2012-10-28 DIAGNOSIS — B86 Scabies: Secondary | ICD-10-CM

## 2012-10-28 DIAGNOSIS — F329 Major depressive disorder, single episode, unspecified: Secondary | ICD-10-CM | POA: Diagnosis present

## 2012-10-28 DIAGNOSIS — Z96649 Presence of unspecified artificial hip joint: Secondary | ICD-10-CM

## 2012-10-28 DIAGNOSIS — Z8679 Personal history of other diseases of the circulatory system: Secondary | ICD-10-CM

## 2012-10-28 DIAGNOSIS — I639 Cerebral infarction, unspecified: Secondary | ICD-10-CM

## 2012-10-28 DIAGNOSIS — E871 Hypo-osmolality and hyponatremia: Secondary | ICD-10-CM

## 2012-10-28 DIAGNOSIS — F3289 Other specified depressive episodes: Secondary | ICD-10-CM | POA: Diagnosis present

## 2012-10-28 DIAGNOSIS — M316 Other giant cell arteritis: Secondary | ICD-10-CM

## 2012-10-28 HISTORY — DX: Anemia, unspecified: D64.9

## 2012-10-28 HISTORY — DX: Generalized anxiety disorder: F41.1

## 2012-10-28 HISTORY — DX: Other chronic pain: G89.29

## 2012-10-28 HISTORY — DX: Migraine, unspecified, not intractable, without status migrainosus: G43.909

## 2012-10-28 HISTORY — DX: Low back pain, unspecified: M54.50

## 2012-10-28 HISTORY — DX: Low back pain: M54.5

## 2012-10-28 LAB — CBC WITH DIFFERENTIAL/PLATELET
Basophils Absolute: 0 10*3/uL (ref 0.0–0.1)
Eosinophils Absolute: 0 10*3/uL (ref 0.0–0.7)
Lymphocytes Relative: 18 % (ref 12–46)
Lymphs Abs: 1.4 10*3/uL (ref 0.7–4.0)
MCH: 29.4 pg (ref 26.0–34.0)
Neutrophils Relative %: 71 % (ref 43–77)
Platelets: 163 10*3/uL (ref 150–400)
RBC: 4.01 MIL/uL (ref 3.87–5.11)
WBC: 7.7 10*3/uL (ref 4.0–10.5)

## 2012-10-28 LAB — COMPREHENSIVE METABOLIC PANEL
ALT: 16 U/L (ref 0–35)
AST: 27 U/L (ref 0–37)
Alkaline Phosphatase: 127 U/L — ABNORMAL HIGH (ref 39–117)
CO2: 27 mEq/L (ref 19–32)
Calcium: 9.3 mg/dL (ref 8.4–10.5)
Chloride: 96 mEq/L (ref 96–112)
GFR calc non Af Amer: 81 mL/min — ABNORMAL LOW (ref >90)
Glucose, Bld: 178 mg/dL — ABNORMAL HIGH (ref 70–99)
Potassium: 3.7 mEq/L (ref 3.5–5.1)
Sodium: 133 mEq/L — ABNORMAL LOW (ref 135–145)

## 2012-10-28 LAB — BASIC METABOLIC PANEL
GFR calc non Af Amer: 82 mL/min — ABNORMAL LOW (ref >90)
Glucose, Bld: 98 mg/dL (ref 70–99)
Potassium: 3.7 mEq/L (ref 3.5–5.1)
Sodium: 135 mEq/L (ref 135–145)

## 2012-10-28 LAB — PROTIME-INR
INR: 1.07 (ref 0.00–1.49)
Prothrombin Time: 13.7 seconds (ref 11.6–15.2)

## 2012-10-28 MED ORDER — HYDROCHLOROTHIAZIDE 12.5 MG PO CAPS
12.5000 mg | ORAL_CAPSULE | Freq: Every day | ORAL | Status: DC
  Administered 2012-10-29 – 2012-10-30 (×2): 12.5 mg via ORAL
  Filled 2012-10-28 (×3): qty 1

## 2012-10-28 MED ORDER — METHYLPREDNISOLONE SODIUM SUCC 125 MG IJ SOLR
125.0000 mg | Freq: Four times a day (QID) | INTRAMUSCULAR | Status: AC
  Administered 2012-10-28 – 2012-10-29 (×3): 125 mg via INTRAVENOUS
  Filled 2012-10-28 (×3): qty 2

## 2012-10-28 MED ORDER — METHYLPREDNISOLONE SODIUM SUCC 125 MG IJ SOLR
60.0000 mg | Freq: Once | INTRAMUSCULAR | Status: AC
  Administered 2012-10-28: 60 mg via INTRAVENOUS
  Filled 2012-10-28: qty 2

## 2012-10-28 MED ORDER — AMLODIPINE BESYLATE 5 MG PO TABS
5.0000 mg | ORAL_TABLET | Freq: Every day | ORAL | Status: DC
  Administered 2012-10-28 – 2012-10-30 (×3): 5 mg via ORAL
  Filled 2012-10-28 (×3): qty 1

## 2012-10-28 MED ORDER — METHYLPREDNISOLONE SODIUM SUCC 125 MG IJ SOLR: 125.0000 mg | Freq: Four times a day (QID) | INTRAMUSCULAR | Status: DC

## 2012-10-28 MED ORDER — SODIUM CHLORIDE 0.9 % IJ SOLN
3.0000 mL | Freq: Two times a day (BID) | INTRAMUSCULAR | Status: DC
  Administered 2012-10-28 – 2012-10-29 (×3): 3 mL via INTRAVENOUS

## 2012-10-28 MED ORDER — TROPICAMIDE 1 % OP SOLN
1.0000 [drp] | Freq: Once | OPHTHALMIC | Status: AC
  Administered 2012-10-28: 1 [drp] via OPHTHALMIC
  Filled 2012-10-28: qty 2

## 2012-10-28 MED ORDER — ENOXAPARIN SODIUM 40 MG/0.4ML ~~LOC~~ SOLN
40.0000 mg | SUBCUTANEOUS | Status: DC
  Administered 2012-10-28 – 2012-10-29 (×2): 40 mg via SUBCUTANEOUS
  Filled 2012-10-28 (×3): qty 0.4

## 2012-10-28 MED ORDER — ACETAMINOPHEN 650 MG RE SUPP: 650.0000 mg | Freq: Four times a day (QID) | RECTAL | Status: DC | PRN

## 2012-10-28 MED ORDER — TEMAZEPAM 15 MG PO CAPS: 30.0000 mg | ORAL_CAPSULE | Freq: Every evening | ORAL | Status: DC | PRN

## 2012-10-28 MED ORDER — ASPIRIN EC 81 MG PO TBEC
81.0000 mg | DELAYED_RELEASE_TABLET | Freq: Every evening | ORAL | Status: DC
  Administered 2012-10-28: 81 mg via ORAL
  Filled 2012-10-28 (×2): qty 1

## 2012-10-28 MED ORDER — SENNOSIDES-DOCUSATE SODIUM 8.6-50 MG PO TABS
1.0000 | ORAL_TABLET | Freq: Every evening | ORAL | Status: DC | PRN
  Filled 2012-10-28: qty 1

## 2012-10-28 MED ORDER — ACETAMINOPHEN 325 MG PO TABS
650.0000 mg | ORAL_TABLET | Freq: Four times a day (QID) | ORAL | Status: DC | PRN
  Administered 2012-10-29: 650 mg via ORAL

## 2012-10-28 MED ORDER — METOPROLOL TARTRATE 50 MG PO TABS
50.0000 mg | ORAL_TABLET | Freq: Two times a day (BID) | ORAL | Status: DC
  Administered 2012-10-28: 25 mg via ORAL
  Administered 2012-10-29: 50 mg via ORAL
  Filled 2012-10-28 (×3): qty 1

## 2012-10-28 MED ORDER — SODIUM CHLORIDE 0.9 % IJ SOLN: 3.0000 mL | INTRAMUSCULAR | Status: DC | PRN

## 2012-10-28 MED ORDER — ALPRAZOLAM 0.5 MG PO TABS
0.5000 mg | ORAL_TABLET | Freq: Every evening | ORAL | Status: DC | PRN
  Administered 2012-10-29 – 2012-10-30 (×2): 0.5 mg via ORAL
  Filled 2012-10-28 (×5): qty 1

## 2012-10-28 MED ORDER — ALUM & MAG HYDROXIDE-SIMETH 200-200-20 MG/5ML PO SUSP: 30.0000 mL | Freq: Four times a day (QID) | ORAL | Status: DC | PRN

## 2012-10-28 MED ORDER — LOSARTAN POTASSIUM-HCTZ 50-12.5 MG PO TABS: 1.0000 | ORAL_TABLET | Freq: Every day | ORAL | Status: DC

## 2012-10-28 MED ORDER — ATORVASTATIN CALCIUM 20 MG PO TABS
20.0000 mg | ORAL_TABLET | Freq: Every day | ORAL | Status: DC
  Administered 2012-10-28 – 2012-10-29 (×2): 20 mg via ORAL
  Filled 2012-10-28 (×3): qty 1

## 2012-10-28 MED ORDER — PANTOPRAZOLE SODIUM 40 MG PO TBEC
40.0000 mg | DELAYED_RELEASE_TABLET | Freq: Every day | ORAL | Status: DC
  Administered 2012-10-29 – 2012-10-30 (×2): 40 mg via ORAL
  Filled 2012-10-28 (×2): qty 1

## 2012-10-28 MED ORDER — LOSARTAN POTASSIUM 50 MG PO TABS
50.0000 mg | ORAL_TABLET | Freq: Every day | ORAL | Status: DC
  Administered 2012-10-29 – 2012-10-30 (×2): 50 mg via ORAL
  Filled 2012-10-28 (×3): qty 1

## 2012-10-28 MED ORDER — MAGNESIUM CITRATE PO SOLN
1.0000 | Freq: Once | ORAL | Status: AC | PRN
  Filled 2012-10-28: qty 296

## 2012-10-28 MED ORDER — HYDROCODONE-ACETAMINOPHEN 5-325 MG PO TABS: 1.0000 | ORAL_TABLET | ORAL | Status: DC | PRN

## 2012-10-28 MED ORDER — ONDANSETRON HCL 4 MG PO TABS: 4.0000 mg | ORAL_TABLET | Freq: Four times a day (QID) | ORAL | Status: DC | PRN

## 2012-10-28 MED ORDER — SORBITOL 70 % SOLN
30.0000 mL | Freq: Every day | Status: DC | PRN
  Filled 2012-10-28: qty 30

## 2012-10-28 MED ORDER — ZOLPIDEM TARTRATE 5 MG PO TABS
5.0000 mg | ORAL_TABLET | Freq: Every evening | ORAL | Status: DC | PRN
  Administered 2012-10-29 (×2): 5 mg via ORAL
  Filled 2012-10-28 (×2): qty 1

## 2012-10-28 MED ORDER — MORPHINE SULFATE 2 MG/ML IJ SOLN: 1.0000 mg | INTRAMUSCULAR | Status: DC | PRN

## 2012-10-28 MED ORDER — PREDNISONE 50 MG PO TABS
60.0000 mg | ORAL_TABLET | Freq: Every day | ORAL | Status: DC
  Administered 2012-10-29 – 2012-10-30 (×2): 60 mg via ORAL
  Filled 2012-10-28 (×4): qty 1

## 2012-10-28 MED ORDER — ONDANSETRON HCL 4 MG/2ML IJ SOLN: 4.0000 mg | Freq: Four times a day (QID) | INTRAMUSCULAR | Status: DC | PRN

## 2012-10-28 MED ORDER — GI COCKTAIL ~~LOC~~: 30.0000 mL | Freq: Three times a day (TID) | ORAL | Status: DC | PRN

## 2012-10-28 MED ORDER — SODIUM CHLORIDE 0.9 % IV SOLN: 250.0000 mL | INTRAVENOUS | Status: DC | PRN

## 2012-10-28 NOTE — ED Notes (Signed)
Pt. Report given to Ray RN at [facility].

## 2012-10-28 NOTE — ED Notes (Signed)
Right eye pain x 1 hour.

## 2012-10-28 NOTE — ED Provider Notes (Addendum)
CSN: [id number]     Arrival date & time 10/28/12  1345 History     First MD Initiated Contact with Patient 10/28/12 1428     Chief Complaint  Patient presents with  . Eye Pain   (Consider location/radiation/quality/duration/timing/severity/associated sxs/prior Treatment) HPI Comments: Patient presents with right eye pain in addition changes. She has a history of temporal arteritis which was diagnosed 3 years ago in [place]. She's been seen a rheumatologist at [facility] has been on prednisone. She has recently been on a prednisone taper and is currently only on 1 mg a day. Yesterday she noticed some minor changes in her vision however today when she was driving she noted some increasing blurriness primarily in her right eye. She has some minor pain behind her right eye. She has no other current symptoms. No numbness or weakness in her extremities or face. No slurred speech. No increased headaches. No nausea or vomiting. No jaw pain. Her primary care physician had checked a sedimentation rate on July 28 which was elevated to 56. This is higher than her past values have been. Her rheumatologist sent her over here for admission for probable flare up of her temporal arteritis.  Patient is a 78 y.o. female presenting with eye pain.  Eye Pain Pertinent negatives include no chest pain, no abdominal pain, no headaches and no shortness of breath.    Past Medical History  Diagnosis Date  . Arthritis      Right hemiarthroplasty after hip fracture 06/2011  . Headache(784.0)   . Hypertension   . Hyperlipidemia   . Incontinence of urine   . GERD (gastroesophageal reflux disease)   . Temporal arteritis     a. followed @ [facility]  . CAD (coronary artery disease)     a. Stent RCA in [facility];  b. Cath approx 2009 - nonobs per pt report.  . Sinus bradycardia     a. on chronic bb  . PAF (paroxysmal atrial fibrillation)     a. lone epidode in 2011 according to notes.  . Depression   .  Shortness of breath   . Anemia   . DDD (degenerative disc disease)   . Scoliosis   . Vocal cord dysfunction   . Diverticulosis   . Hemorrhoids   . Hiatal hernia   . Enteritis    Past Surgical History  Procedure Laterality Date  . Laparoscopic cholecystectomy  2001  . Tonsillectomy and adenoidectomy    . Abdominal hysterectomy  age 71    TAH  . Bladder suspension    . Hemiarthroplasty hip  06/2011    fractured her hip and repaired in [place], [place]  . Coronary angioplasty with stent placement  2006  . Colonoscopy  01/26/2012    Procedure: COLONOSCOPY;  Surgeon: Meryl Dare, MD,FACG;  Location: [facility] ENDOSCOPY;  Service: Endoscopy;  Laterality: N/A;  note the EGD is possible  . Esophagogastroduodenoscopy  01/26/2012    Procedure: ESOPHAGOGASTRODUODENOSCOPY (EGD);  Surgeon: Meryl Dare, MD,FACG;  Location: [facility] ENDOSCOPY;  Service: Endoscopy;  Laterality: N/A;  . Cateract     Family History  Problem Relation Age of Onset  . Cancer Daughter     breast  . Arthritis Neg Hx   . Heart disease Other   . Hypertension Other   . Heart attack Father   . Cancer Mother    History  Substance Use Topics  . Smoking status: Never Smoker   . Smokeless tobacco: Never Used  .  Alcohol Use: 1.0 oz/week    2 drink(s) per week     Comment: rarely   OB History   Grav Para Term Preterm Abortions TAB SAB Ect Mult Living                 Review of Systems  Constitutional: Negative for fever, chills, diaphoresis and fatigue.  HENT: Negative for congestion, rhinorrhea and sneezing.   Eyes: Positive for pain and visual disturbance.  Respiratory: Negative for cough, chest tightness and shortness of breath.   Cardiovascular: Negative for chest pain and leg swelling.  Gastrointestinal: Negative for nausea, vomiting, abdominal pain, diarrhea and blood in stool.  Genitourinary: Negative for frequency, hematuria, flank pain and difficulty urinating.  Musculoskeletal: Negative for back pain and  arthralgias.  Skin: Negative for rash.  Neurological: Negative for dizziness, speech difficulty, weakness, numbness and headaches.    Allergies  Tussionex pennkinetic er  Home Medications   Current Outpatient Rx  Name  Route  Sig  Dispense  Refill  . ALPRAZolam (XANAX) 0.5 MG tablet   Oral   Take 1 tablet (0.5 mg total) by mouth at bedtime as needed for sleep.   20 tablet   0   . amLODipine (NORVASC) 5 MG tablet      TAKE 1 TABLET BY MOUTH DAILY   30 tablet   11   . aspirin EC 81 MG tablet   Oral   Take 81 mg by mouth every evening.         Marland Kitchen atorvastatin (LIPITOR) 20 MG tablet      TAKE 1 TABLET BY MOUTH EVERY DAY   90 tablet   3   . atorvastatin (LIPITOR) 20 MG tablet      TAKE 1 TABLET BY MOUTH EVERY DAY   90 tablet   3   . calcium carbonate (OS-CAL) 600 MG TABS   Oral   Take 600 mg by mouth daily.         . cholecalciferol (VITAMIN D) 1000 UNITS tablet   Oral   Take 1,000 Units by mouth 2 (two) times daily.          Marland Kitchen glucose blood (FREESTYLE LITE) test strip      Patient testing BS once daily. Diag code 446.5   100 each   3     Patient testing once daily Diag code 446.5   . HYDROcodone-acetaminophen (VICODIN) 5-500 MG per tablet   Oral   Take 1 tablet by mouth every 6 (six) hours as needed for pain.   30 tablet   0   . losartan-hydrochlorothiazide (HYZAAR) 50-12.5 MG per tablet   Oral   Take 1 tablet by mouth daily.   90 tablet   3   . metoprolol (LOPRESSOR) 50 MG tablet      TAKE 1 TABLET BY MOUTH TWICE A DAY   180 tablet   1   . Nutritional Supplements (JUICE PLUS FIBRE PO)   Oral   Take 1 capsule by mouth daily.         . ondansetron (ZOFRAN ODT) 8 MG disintegrating tablet   Oral   Take 1 tablet (8 mg total) by mouth every 8 (eight) hours as needed for nausea.   20 tablet   0   . predniSONE (DELTASONE) 1 MG tablet   Oral   Take 2-3 tablets (2-3 mg total) by mouth See admin instructions. 2 tabs daily   90 tablet    3   .  temazepam (RESTORIL) 15 MG capsule      TAKE 2 CAPSULES BY MOUTH AT BEDTIME AS NEEDED FOR SLEEP   60 capsule   0   . temazepam (RESTORIL) 30 MG capsule   Oral   Take 1 capsule (30 mg total) by mouth at bedtime as needed for sleep.   30 capsule   0   . vitamin C (ASCORBIC ACID) 500 MG tablet   Oral   Take 1,000 mg by mouth daily. For sore throat          BP 148/60  Pulse 60  Temp(Src) 97.9 F (36.6 C) (Oral)  Resp 16  Ht 5' 4.5" (1.638 m)  Wt 137 lb (62.143 kg)  BMI 23.16 kg/m2  SpO2 99% Physical Exam  Constitutional: She is oriented to person, place, and time. She appears well-developed and well-nourished.  HENT:  Head: Normocephalic and atraumatic.  No pain over the temporal artery  Eyes: Pupils are equal, round, and reactive to light.  No redness or drainage from the eyes  Neck: Normal range of motion. Neck supple.  Cardiovascular: Normal rate, regular rhythm and normal heart sounds.   Pulmonary/Chest: Effort normal and breath sounds normal. No respiratory distress. She has no wheezes. She has no rales. She exhibits no tenderness.  Abdominal: Soft. Bowel sounds are normal. There is no tenderness. There is no rebound and no guarding.  Musculoskeletal: Normal range of motion. She exhibits no edema.  Lymphadenopathy:    She has no cervical adenopathy.  Neurological: She is alert and oriented to person, place, and time. She has normal strength. No cranial nerve deficit or sensory deficit. GCS eye subscore is 4. GCS verbal subscore is 5. GCS motor subscore is 6.  Skin: Skin is warm and dry. No rash noted.  Psychiatric: She has a normal mood and affect.    ED Course   Procedures (including critical care time)  Results for orders placed during the hospital encounter of 10/28/12  CBC WITH DIFFERENTIAL      Result Value Range   WBC 7.7  4.0 - 10.5 K/uL   RBC 4.01  3.87 - 5.11 MIL/uL   Hemoglobin 11.8 (*) 12.0 - 15.0 g/dL   HCT 16.1 (*) 09.6 - 04.5 %   MCV  84.8  78.0 - 100.0 fL   MCH 29.4  26.0 - 34.0 pg   MCHC 34.7  30.0 - 36.0 g/dL   RDW 40.9  81.1 - 91.4 %   Platelets 163  150 - 400 K/uL   Neutrophils Relative % 71  43 - 77 %   Neutro Abs 5.5  1.7 - 7.7 K/uL   Lymphocytes Relative 18  12 - 46 %   Lymphs Abs 1.4  0.7 - 4.0 K/uL   Monocytes Relative 10  3 - 12 %   Monocytes Absolute 0.8  0.1 - 1.0 K/uL   Eosinophils Relative 1  0 - 5 %   Eosinophils Absolute 0.0  0.0 - 0.7 K/uL   Basophils Relative 0  0 - 1 %   Basophils Absolute 0.0  0.0 - 0.1 K/uL  BASIC METABOLIC PANEL      Result Value Range   Sodium 135  135 - 145 mEq/L   Potassium 3.7  3.5 - 5.1 mEq/L   Chloride 96  96 - 112 mEq/L   CO2 29  19 - 32 mEq/L   Glucose, Bld 98  70 - 99 mg/dL   BUN 11  6 - 23 mg/dL  Creatinine, Ser 0.70  0.50 - 1.10 mg/dL   Calcium 16.1  8.4 - 09.6 mg/dL   GFR calc non Af Amer 82 (*) >90 mL/min   GFR calc Af Amer >90  >90 mL/min    Date: 10/28/2012  Rate: 59  Rhythm: normal sinus rhythm  QRS Axis: normal  Intervals: normal  ST/T Wave abnormalities: normal  Conduction Disutrbances:none  Narrative Interpretation:   Old EKG Reviewed: unchanged   No results found.   No results found. 1. Temporal arteritis     MDM  I discussed patient Dr. Thad Ranger with dinner hospitalist team who will see the patient in the hospital. She was given Solu-Medrol 60 mg IV per Dr. Thad Ranger recommendation. I also talked with hospitalist who has accepted patient for transfer to [facility].  Rolan Bucco, MD 10/28/12 1515  Rolan Bucco, MD 10/28/12 [phone]

## 2012-10-28 NOTE — ED Notes (Signed)
MD at bedside. 

## 2012-10-28 NOTE — Consult Note (Signed)
78 yo female who has biopsy-proven GCA approx 3 yrs ago in [place] has been followed at [facility] for her eye care and rheumatologic care.  3 years ago had an episode of amaurosis in both eyes at the same time which returned completely in the right eye but only about 80% in the left according to the patient.  She had cataract surgery right eye several months ago at [facility] and has been doing well since then. She has tapered her prednisone to 1mg  per day. Her ESRs have all remained in the normal range(in the teens according to the patient) She had an ESR done on 7/28 that was reported to be 58.  She has been admitted and administered IV Solumedrol She had an episode of loss of her peripheral vision in the right eye(her good eye)approx 10 hours ago that has not changed since it occurred.  She says that it is not tunnel vision, but is similar in that it is her entire periphery in the right eye only.    VA  Near only with correction OD:20/20                                                 OS:20/30 Pupils: OD:77mm              OS:27mm   Equal round reactive to light with ?slight APD OD EOMs: Normal OU Applanation IOPs OD: 14  10:55pm                              OS:  14 Tonopen IOPs:  OD: 16  11:05pm      Tonopen IOPs after dilation  OD: 18  12:35 am                            OS: 15                                                                     OS: 20 Slit Lamp Exam: Lids/ Lashes: Normal OU Conj: Clear OU Cornea: Clear OU AC: OD: Deep and Quiet        OS:  Deep centrally, shallow peripherally Iris: Normal OU Lens: OD: PCIOL            OS: 2+ NS and 2-3+ cortical cataract Dilated Fundus Exam:  Dilated with Tropicamide 1% OU at 11:30pm The patient and daughter were warned that one possible side effect of the dilating drops is a narrow angle glaucoma attack. So they are to let the nurses know that they should call me immediately if there is pain or change in vision in the Left eye assoc with nausea and  vomiting. She had her eyes dilated at [facility] several months ago prior to cataract surgery without incident. And it was important and necessary to dilate the left eye today as well.  Cup/Disc ratio:  OD: 0.5 deep cup slightly thin rim temporally  OS: 0.4 deep cup symmetric rim Macula: OD: it appears that there is a drusen inferiorly in the macula (I would not rule-out a Hollenhorst plaque without a fluorocein angiogram confirmation, but it appears to be in the retina and not in the blood vessel)              OS: Normal (but more difficult view secondary to the cataract) Vessels: mild arteriosclerosis and moderate A/V crossing changes OU Peripheral Retina: Normal OU with some reticular degeneration in the far periphery Vitreous: Normal OU  Assess: There is no specific cause for her peripheral visual loss OD but her GCA obviously is the most important to consider. Others include: Stroke/TIA: but in general she should have loss of visual field in BOTH eyes that respects the vertical meridians Hypertension/Hypertensive Retinopathy: could have a partial Branch retinal vein occlusion or partial Central Retinal Vein Occlusion Hollenhorst plaque from the carotid artery or heart: either in the retinal vasculature or retrobulbar in the  optic nerve Platelet Embolus secondary to her atrial fibrillation   Plan: Continue IV steroids (and po steroids upon discharge at least 60mg  per day) MRI of brain/orbits/optic nerves(72mm cuts at the level of the optic nerves) Continue anticoagulation(aspirin and heparin) Ultrasound of carotids and vertebrals, ultrasound of the heart In the office: OCT of the optic nerve and macula OU                      Visual Field testing                      Fluorocein Angiogram by Retina Specialist  Trish Fountain MD

## 2012-10-28 NOTE — Telephone Encounter (Signed)
Patient Information:  Caller Name: Paddy  Phone: [phone]  Patient: Betty Jackson, Betty Jackson  Gender: Female  DOB: 28-May-1934  Age: 78 Years  PCP: Evelena Peat (Family Practice)  Office Follow Up:  Does the office need to follow up with this patient?: N/A  Instructions For The Office: N/A  RN Note:  Pt has history of Temporal Arteritis, hospitalized w/ same symptoms March 2-12.  Pt is waiting RA MD from [facility] to call her back.  Pt states time is of the essence, she was advised treatment much be received quick before blindness sets in and there no improvement.  Advised Pt to go to ED due to last treatment had to be given IV, Pt's vision is worsen.  Pt agreed w/ ED dispo.   Symptoms  Reason For Call & Symptoms: ER CALL, Vision Changes, Temporal Arteritis  Reviewed Health History In EMR: N/A  Reviewed Medications In EMR: N/A  Reviewed Allergies In EMR: N/A  Reviewed Surgeries / Procedures: N/A  Date of Onset of Symptoms: 10/28/2012  Treatments Tried: Prednisone 1mg   Treatments Tried Worked: No  Guideline(s) Used:  Neurologic Deficit  Disposition Per Guideline:   See Today in Office  Reason For Disposition Reached:   Patient wants to be seen  Advice Given:  N/A  RN Overrode Recommendation:  Go To ED  PT may need Steroids via IV

## 2012-10-28 NOTE — Progress Notes (Signed)
Spoke with Dr Fredderick Phenix at [facility]-Patient with a hx of Temporal Arteritis-being tapered off Steroids-coming in with blurry vision and Headaches, Suspected flare of Temporal Arteritis. Stable otherwise. Accepted to Telemetry bed, she has spoken with Dr Reynolds-Neurology. Patient has Rheumatolgist in [facility], who the ED spoke to and recommended inpatient admission for IV Steroids. Patient wants to stay in [place]. Accepted to Telemetry bed.

## 2012-10-28 NOTE — Telephone Encounter (Signed)
Called and Patient  Husband informed me that she is on her way to the ED

## 2012-10-28 NOTE — H&P (Signed)
[facility] History and Physical  Betty Jackson ZOX:[id number] DOB: 22-May-1934 DOA: 10/28/2012  Referring physician: Dr Fredderick Phenix PCP: Kristian Covey, MD  Specialists: Rheumatology: Dr Sigurd Sos, [facility], [phone]  Chief Complaint: Visual changes  HPI: Betty Jackson is a 78 y.o. female with past medical history of temporal arteritis which was diagnosed 2 years ago in [place] via temporal arteritis biopsy and elevated sedimentation rate with 25% visual loss in the left eye, history of hypertension, hyperlipidemia, headache, depression/anxiety who presents to [facility] with complaints of visual changes. Patient has been followed by rheumatologist M. Clovis Riley at [facility] had been on prednisone however per patient she is been undergoing a prednisone taper over the past 2 years and currently on 1 mg daily. 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 Patient denies any headaches, no diplopia, no jaw pain, no fever, no chills, no fatigue, no chest pain, no shortness of breath, no nausea, no vomiting, no abdominal pain, no diarrhea, no constipation, no dysuria, no weakness, no cough. Patient denies any other associated symptoms. Patient was seen in the emergency room in ED physician spoke with neurologist on call due to concern for temporal arteritis flare. Patient was given 60 mg of IV Solu-Medrol x1. Also of note patient had a sedimentation rate which was done on 10/24/2012 which was elevated at 56. We were called to admit the patient for further evaluation and management.   Review of Systems: The patient denies anorexia, fever, weight loss,, vision loss, decreased hearing, hoarseness, chest pain, syncope, dyspnea on exertion, peripheral edema, balance deficits, hemoptysis, abdominal pain, melena, hematochezia, severe indigestion/heartburn,  hematuria, incontinence, genital sores, muscle weakness, suspicious skin lesions, transient blindness, difficulty walking, depression, unusual weight change, abnormal bleeding, enlarged lymph nodes, angioedema, and breast masses.   Past Medical History  Diagnosis Date  . Hypertension   . Hyperlipidemia   . Incontinence of urine   . CAD (coronary artery disease)     a. Stent RCA in [facility];  b. Cath approx 2009 - nonobs per pt report.  . Sinus bradycardia     a. on chronic bb  . PAF (paroxysmal atrial fibrillation)     a. lone epidode in 2011 according to notes.  . Depression   . Scoliosis   . Vocal cord dysfunction     "they don't operate properly" (10/28/2012)  . Diverticulosis   . Hemorrhoids   . Hiatal hernia   . Enteritis   . GERD (gastroesophageal reflux disease)   . Anxiety     "more so the last few days" (10/28/2012)  . Temporal arteritis 2011    a. followed @ [facility]; potential flareup 10/28/2012/notes 10/28/2012  . Exertional shortness of breath     "every now and then" (10/28/2012)  . Anemia     "one time" (10/28/2012)  . Migraines     "back in my 20's; none since" (10/28/2012)  . Arthritis      Right hemiarthroplasty after hip fracture 06/2011  . DDD (degenerative disc disease)   . Arthritis     "hands" (10/28/2012)  . Chronic lower back pain   . Anxiety state, unspecified 10/28/2012   Past Surgical History  Procedure Laterality Date  . Laparoscopic cholecystectomy  2001  . Tonsillectomy and adenoidectomy    . Abdominal hysterectomy  ~ 1984    TAH  . Bladder  suspension    . Hemiarthroplasty hip  06/2011    fractured her hip and repaired in [place], [place]  . Colonoscopy  01/26/2012    Procedure: COLONOSCOPY;  Surgeon: Meryl Dare, MD,FACG;  Location: [facility] ENDOSCOPY;  Service: Endoscopy;  Laterality: N/A;  note the EGD is possible  . Esophagogastroduodenoscopy  01/26/2012    Procedure: ESOPHAGOGASTRODUODENOSCOPY (EGD);  Surgeon: Meryl Dare, MD,FACG;  Location: [facility]  ENDOSCOPY;  Service: Endoscopy;  Laterality: N/A;  . Cataract extraction w/ intraocular lens implant Right ~ 08/2012  . Coronary angioplasty with stent placement  2006  . Back surgery    . Temporal artery biopsy / ligation Bilateral 2011  . Tubal ligation  ~ 1982  . X-stop implantation  ~ 2010    "lower back" (10/28/2012)   Social History:  reports that she has never smoked. She has never used smokeless tobacco. She reports that she drinks about 0.6 ounces of alcohol per week. She reports that she does not use illicit drugs.  Allergies  Allergen Reactions  . Tussionex Pennkinetic Er (Hydrocod Polst-Cpm Polst Er) Hives    Family History  Problem Relation Age of Onset  . Cancer Daughter     breast  . Arthritis Neg Hx   . Heart disease Other   . Hypertension Other   . Heart attack Father   . Cancer Mother      Prior to Admission medications   Medication Sig Start Date End Date Taking? Authorizing Provider  ALPRAZolam Prudy Feeler) 0.5 MG tablet Take 1 tablet (0.5 mg total) by mouth at bedtime as needed for sleep. 12/26/11   Zannie Cove, MD  amLODipine (NORVASC) 5 MG tablet TAKE 1 TABLET BY MOUTH DAILY 04/08/12   Kristian Covey, MD  aspirin EC 81 MG tablet Take 81 mg by mouth every evening.    Historical Provider, MD  atorvastatin (LIPITOR) 20 MG tablet TAKE 1 TABLET BY MOUTH EVERY DAY 05/15/12   Kristian Covey, MD  atorvastatin (LIPITOR) 20 MG tablet TAKE 1 TABLET BY MOUTH EVERY DAY 08/22/12   Kristian Covey, MD  calcium carbonate (OS-CAL) 600 MG TABS Take 600 mg by mouth daily.    Historical Provider, MD  cholecalciferol (VITAMIN D) 1000 UNITS tablet Take 1,000 Units by mouth 2 (two) times daily.     Historical Provider, MD  glucose blood (FREESTYLE LITE) test strip Patient testing BS once daily. Diag code 446.5 02/08/12   Kristian Covey, MD  HYDROcodone-acetaminophen (VICODIN) 5-500 MG per tablet Take 1 tablet by mouth every 6 (six) hours as needed for pain. 12/26/11   Zannie Cove, MD  losartan-hydrochlorothiazide (HYZAAR) 50-12.5 MG per tablet Take 1 tablet by mouth daily. 06/07/12   Kristian Covey, MD  metoprolol (LOPRESSOR) 50 MG tablet TAKE 1 TABLET BY MOUTH TWICE A DAY 09/04/12   Kristian Covey, MD  Nutritional Supplements (JUICE PLUS FIBRE PO) Take 1 capsule by mouth daily.    Historical Provider, MD  ondansetron (ZOFRAN ODT) 8 MG disintegrating tablet Take 1 tablet (8 mg total) by mouth every 8 (eight) hours as needed for nausea. 02/02/12   Kristian Covey, MD  predniSONE (DELTASONE) 1 MG tablet Take 2-3 tablets (2-3 mg total) by mouth See admin instructions. 2 tabs daily 08/16/12   Kristian Covey, MD  temazepam (RESTORIL) 15 MG capsule TAKE 2 CAPSULES BY MOUTH AT BEDTIME AS NEEDED FOR SLEEP 09/11/12   Kristian Covey, MD  temazepam (RESTORIL) 30 MG capsule Take 1  capsule (30 mg total) by mouth at bedtime as needed for sleep. 06/29/12   Kristian Covey, MD  vitamin C (ASCORBIC ACID) 500 MG tablet Take 1,000 mg by mouth daily. For sore throat    Historical Provider, MD   Physical Exam: Filed Vitals:   10/28/12 1351 10/28/12 1542 10/28/12 1726  BP: 158/69 148/60 161/76  Pulse: 63 60 62  Temp: 97.9 F (36.6 C)  98.1 F (36.7 C)  TempSrc: Oral  Oral  Resp: 20 16 20   Height: 5' 4.5" (1.638 m)  5' 4.5" (1.638 m)  Weight: 62.143 kg (137 lb)  63.7 kg (140 lb 6.9 oz)  SpO2: 100% 99% 99%     General:  Elderly female well-developed well-nourished in no acute cardiopulmonary distress.  Eyes: Pupils equal) reactive to light and accommodation. Extraocular movements intact.  ENT: Oropharynx is clear, no lesions, no exudates.  Neck: Supple with no lymphadenopathy.  Cardiovascular: Regular rate rhythm no murmurs rubs or gallops. No JVD. No lower extremity edema.  Respiratory: Clear to auscultation bilaterally. No wheezing, no crackles, no rhonchi.  Abdomen: Soft, nontender, nondistended, positive bowel sounds.  Skin: No rashes or  lesions.  Musculoskeletal: 5/5 BUE strength, 5/5 BLE strength  Psychiatric: Normal mood. Normal affect. Good insight. Good judgment.  Neurologic: Alert and aren't at x3. Cranial nerves II through XII are grossly intact. No focal deficits. Sensation is intact. Visual fields are intact. Gait not tested secondary to safety.  Labs on Admission:  Basic Metabolic Panel:  Recent Labs Lab 10/28/12 1435  NA 135  K 3.7  CL 96  CO2 29  GLUCOSE 98  BUN 11  CREATININE 0.70  CALCIUM 10.0   Liver Function Tests: No results found for this basename: AST, ALT, ALKPHOS, BILITOT, PROT, ALBUMIN,  in the last 168 hours No results found for this basename: LIPASE, AMYLASE,  in the last 168 hours No results found for this basename: AMMONIA,  in the last 168 hours CBC:  Recent Labs Lab 10/28/12 1435  WBC 7.7  NEUTROABS 5.5  HGB 11.8*  HCT 34.0*  MCV 84.8  PLT 163   Cardiac Enzymes: No results found for this basename: CKTOTAL, CKMB, CKMBINDEX, TROPONINI,  in the last 168 hours  BNP (last 3 results) No results found for this basename: PROBNP,  in the last 8760 hours CBG: No results found for this basename: GLUCAP,  in the last 168 hours  Radiological Exams on Admission: No results found.  EKG: None  Assessment/Plan Principal Problem:   Visual changes Active Problems:   Temporal arteritis   Hypertension   Hyperlipidemia   CAD (coronary artery disease)   Depression   Anxiety state, unspecified   Anemia, unspecified   #1 visual changes/?? Temporal arthritis flare Likely secondary to a temporal arthritis flare. Patient presented with acute visual changes with a history of temporal arteritis last sedimentation rate of 56 to 10/24/2012, was on a steroid taper and currently on prednisone 1 mg daily. Patient was given a dose of Solu-Medrol 60 mg IV x1. Will admit the patient to telemetry. Will check a sedimentation rate. Check a CRP. Check a comprehensive metabolic profile. Check a CBC.  Check coags. We'll place on prednisone 60 mg daily starting tomorrow. We'll also consult with ophthalmology( Dr Gwen Pounds) for further evaluation and management. Will also consult with neurology for further evaluation and management. Spoke with patient's rheumatologist Dr. Sigurd Sos via telephone [phone]) will be glad to touch base with rounding physician at the patient has  been assessed with ophthalmology. Doubt if rheumatology consult in house. Follow.  #2 history of temporal arteritis See problem #1. We'll need to followup with her rheumatologist as an outpatient.  #3 hypertension Continue home regimen of Norvasc, Hyzaar, Lopressor.   #4 hyperlipidemia Continue statin.  #5 coronary artery disease Stable. Continue Hyzaar, Norvasc, Lopressor, aspirin.  #6 depression/anxiety Stable. Continue home regimen of Xanax as needed.  #7 prophylaxis Protonix for GI prophylaxis. Lovenox for DVT prophylaxis.   Code Status: Full Family Communication: Updated patient, daughter, granddaughter at bedside.  Disposition Plan: Home when medically stable  Time spent: 65 mins  [facility] Pager [phone]  If 7PM-7AM, please contact night-coverage [email or web address] Password [facility] 10/28/2012, 6:49 PM

## 2012-10-28 NOTE — ED Notes (Signed)
Attempted to call report to [facility].  Pt. Nurse at [facility] to call RN Earlene Plater back for report.

## 2012-10-28 NOTE — Consult Note (Signed)
NEURO HOSPITALIST CONSULT NOTE    Reason for Consult: visual disturbances. Called as STAT consult.  HPI:                                                                                                                                          Betty Jackson is an 78 y.o. female, right handed, with a past medical history significant for hypertension, hyperlipidemia, CAD s/p stent RCA 2006, paroxysmal atrial fibrillation x 1 episode in 2011, depression, anxiety, GERD, biopsy-proven bilateral temporal arteritis with residual 25% visual loss left eye followed by rheumatologist Dr. Clovis Riley at [facility], comes in today complaining of new onset visual dysfunction involving her right eye. She has been gradually tapering off prednisone and is was taking 1 mg prednisone daily at the time of this admission. Betty Jackson expressed that last night she had " an unusual sensation of things not quite right in my right eye" but when awoke this morning there was nothing particularly wrong with her vision. However, around noon time today she was driving and noticed that she was experiencing tunnel vision and  her peripheral vision in the right eye was impaired and " kind of fuzzy". She recalls having some pain behind the right ye and perhaps mild eyelid droopiness but denies frank headache, nausea, vomiting, vertigo, double vision, focal weakness or numbness, slurred speech, unsteadiness, confusion, or language impairment. She had a sed rate of 56  On 10/24/12 and when she presented to [facility] ED she was administered 60 mg IV solumedrol x 1. Presently, she said that she feels fine but her right eye vision is not 100% back. Takes aspirin 81 mg daily and atorvastatin 20 mg.      Past Medical History  Diagnosis Date  . Hypertension   . Hyperlipidemia   . Incontinence of urine   . CAD (coronary artery disease)     a. Stent RCA in [facility];  b. Cath approx 2009 - nonobs per pt report.   . Sinus bradycardia     a. on chronic bb  . PAF (paroxysmal atrial fibrillation)     a. lone epidode in 2011 according to notes.  . Depression   . Scoliosis   . Vocal cord dysfunction     "they don't operate properly" (10/28/2012)  . Diverticulosis   . Hemorrhoids   . Hiatal hernia   . Enteritis   . GERD (gastroesophageal reflux disease)   . Anxiety     "more so the last few days" (10/28/2012)  . Temporal arteritis 2011    a. followed @ [facility]; potential flareup 10/28/2012/notes 10/28/2012  . Exertional shortness of breath     "every now and then" (10/28/2012)  . Anemia     "one time" (10/28/2012)  .  Migraines     "back in my 20's; none since" (10/28/2012)  . Arthritis      Right hemiarthroplasty after hip fracture 06/2011  . DDD (degenerative disc disease)   . Arthritis     "hands" (10/28/2012)  . Chronic lower back pain   . Anxiety state, unspecified 10/28/2012    Past Surgical History  Procedure Laterality Date  . Laparoscopic cholecystectomy  2001  . Tonsillectomy and adenoidectomy    . Abdominal hysterectomy  ~ 1984    TAH  . Bladder suspension    . Hemiarthroplasty hip  06/2011    fractured her hip and repaired in [place], [place]  . Colonoscopy  01/26/2012    Procedure: COLONOSCOPY;  Surgeon: Meryl Dare, MD,FACG;  Location: [facility] ENDOSCOPY;  Service: Endoscopy;  Laterality: N/A;  note the EGD is possible  . Esophagogastroduodenoscopy  01/26/2012    Procedure: ESOPHAGOGASTRODUODENOSCOPY (EGD);  Surgeon: Meryl Dare, MD,FACG;  Location: [facility] ENDOSCOPY;  Service: Endoscopy;  Laterality: N/A;  . Cataract extraction w/ intraocular lens implant Right ~ 08/2012  . Coronary angioplasty with stent placement  2006  . Back surgery    . Temporal artery biopsy / ligation Bilateral 2011  . Tubal ligation  ~ 1982  . X-stop implantation  ~ 2010    "lower back" (10/28/2012)    Family History  Problem Relation Age of Onset  . Cancer Daughter     breast  . Arthritis Neg Hx   . Heart disease  Other   . Hypertension Other   . Heart attack Father   . Cancer Mother      Social History:  reports that she has never smoked. She has never used smokeless tobacco. She reports that she drinks about 0.6 ounces of alcohol per week. She reports that she does not use illicit drugs.  Allergies  Allergen Reactions  . Tussionex Pennkinetic Er (Hydrocod Polst-Cpm Polst Er) Hives    MEDICATIONS:                                                                                                                     I have reviewed the patient's current medications.   ROS:                                                                                                                                       History obtained from the patient and  chart review.  General ROS: negative for - chills, fatigue, fever, night sweats, weight gain or weight loss Psychological ROS: negative for - behavioral disorder, hallucinations, memory difficulties, or suicidal ideation Ophthalmic ROS: negative for - double vision ENT ROS: negative for - epistaxis, nasal discharge, oral lesions, sore throat, tinnitus or vertigo Allergy and Immunology ROS: negative for - hives or itchy/watery eyes Hematological and Lymphatic ROS: negative for - bleeding problems, bruising or swollen lymph nodes Endocrine ROS: negative for - galactorrhea, hair pattern changes, polydipsia/polyuria or temperature intolerance Respiratory ROS: negative for - cough, hemoptysis, shortness of breath or wheezing Cardiovascular ROS: negative for - chest pain, dyspnea on exertion, edema or irregular heartbeat Gastrointestinal ROS: negative for - abdominal pain, diarrhea, hematemesis, nausea/vomiting or stool incontinence Genito-Urinary ROS: negative for - dysuria, hematuria, incontinence or urinary frequency/urgency Musculoskeletal ROS: negative for - joint swelling or muscular weakness Neurological ROS: as noted in HPI Dermatological ROS: negative for  rash and skin lesion changes   Physical exam: pleasant female in no apparent distress.  Blood pressure 161/76, pulse 62, temperature 98.1 F (36.7 C), temperature source Oral, resp. rate 20, height 5' 4.5" (1.638 m), weight 63.7 kg (140 lb 6.9 oz), SpO2 99.00%. Head: normocephalic. Neck: supple, no bruits, no JVD. Cardiac: no murmurs. Lungs: clear. Abdomen: soft, no tender, no mass. Extremities: no edema.    Neurologic Examination:                                                                                                      Mental Status: Alert, awake,oriented x 4, thought content appropriate. Comprehension, naming, and repetition intact. Speech fluent without evidence of aphasia.  Cranial Nerves: II: Discs flat bilaterally; Visual fields grossly normal, pupils equal, round, reactive to light and accommodation III,IV, VI: ptosis not present, extra-ocular motions intact bilaterally V,VII: smile symmetric, facial light touch sensation normal bilaterally VIII: hearing normal bilaterally IX,X: gag reflex present XI: bilateral shoulder shrug XII: midline tongue extension Motor: Right : Upper extremity   5/5    Left:     Upper extremity   5/5  Lower extremity   5/5     Lower extremity   5/5 Tone and bulk:normal tone throughout; no atrophy noted Sensory: Pinprick and light touch intact throughout, bilaterally Deep Tendon Reflexes:  5/5 all over.  Plantars: Right: downgoing   Left: downgoing Cerebellar: normal finger-to-nose,  normal heel-to-shin test Gait:  No ataxia. CV: pulses palpable throughout    Lab Results  Component Value Date/Time   CHOL 114 06/29/2012 11:37 AM    Results for orders placed during the hospital encounter of 10/28/12 (from the past 48 hour(s))  CBC WITH DIFFERENTIAL     Status: Abnormal   Collection Time    10/28/12  2:35 PM      Result Value Range   WBC 7.7  4.0 - 10.5 K/uL   RBC 4.01  3.87 - 5.11 MIL/uL   Hemoglobin 11.8 (*) 12.0 - 15.0  g/dL   HCT 16.1 (*) 09.6 - 04.5 %   MCV 84.8  78.0 - 100.0 fL  MCH 29.4  26.0 - 34.0 pg   MCHC 34.7  30.0 - 36.0 g/dL   RDW 96.0  45.4 - 09.8 %   Platelets 163  150 - 400 K/uL   Neutrophils Relative % 71  43 - 77 %   Neutro Abs 5.5  1.7 - 7.7 K/uL   Lymphocytes Relative 18  12 - 46 %   Lymphs Abs 1.4  0.7 - 4.0 K/uL   Monocytes Relative 10  3 - 12 %   Monocytes Absolute 0.8  0.1 - 1.0 K/uL   Eosinophils Relative 1  0 - 5 %   Eosinophils Absolute 0.0  0.0 - 0.7 K/uL   Basophils Relative 0  0 - 1 %   Basophils Absolute 0.0  0.0 - 0.1 K/uL  BASIC METABOLIC PANEL     Status: Abnormal   Collection Time    10/28/12  2:35 PM      Result Value Range   Sodium 135  135 - 145 mEq/L   Potassium 3.7  3.5 - 5.1 mEq/L   Chloride 96  96 - 112 mEq/L   CO2 29  19 - 32 mEq/L   Glucose, Bld 98  70 - 99 mg/dL   BUN 11  6 - 23 mg/dL   Creatinine, Ser 1.19  0.50 - 1.10 mg/dL   Calcium 14.7  8.4 - 82.9 mg/dL   GFR calc non Af Amer 82 (*) >90 mL/min   GFR calc Af Amer >90  >90 mL/min   Comment:            The eGFR has been calculated     using the CKD EPI equation.     This calculation has not been     validated in all clinical     situations.     eGFR's persistently     <90 mL/min signify     possible Chronic Kidney Disease.  SEDIMENTATION RATE     Status: Abnormal   Collection Time    10/28/12  7:04 PM      Result Value Range   Sed Rate 39 (*) 0 - 22 mm/hr  COMPREHENSIVE METABOLIC PANEL     Status: Abnormal   Collection Time    10/28/12  7:04 PM      Result Value Range   Sodium 133 (*) 135 - 145 mEq/L   Potassium 3.7  3.5 - 5.1 mEq/L   Chloride 96  96 - 112 mEq/L   CO2 27  19 - 32 mEq/L   Glucose, Bld 178 (*) 70 - 99 mg/dL   BUN 10  6 - 23 mg/dL   Creatinine, Ser 5.62  0.50 - 1.10 mg/dL   Calcium 9.3  8.4 - 13.0 mg/dL   Total Protein 7.4  6.0 - 8.3 g/dL   Albumin 3.9  3.5 - 5.2 g/dL   AST 27  0 - 37 U/L   ALT 16  0 - 35 U/L   Alkaline Phosphatase 127 (*) 39 - 117 U/L   Total  Bilirubin 0.6  0.3 - 1.2 mg/dL   GFR calc non Af Amer 81 (*) >90 mL/min   GFR calc Af Amer >90  >90 mL/min   Comment:            The eGFR has been calculated     using the CKD EPI equation.     This calculation has not been     validated in all clinical     situations.  eGFR's persistently     <90 mL/min signify     possible Chronic Kidney Disease.  MAGNESIUM     Status: None   Collection Time    10/28/12  7:04 PM      Result Value Range   Magnesium 1.7  1.5 - 2.5 mg/dL  APTT     Status: None   Collection Time    10/28/12  7:04 PM      Result Value Range   aPTT 32  24 - 37 seconds  PROTIME-INR     Status: None   Collection Time    10/28/12  7:04 PM      Result Value Range   Prothrombin Time 13.7  11.6 - 15.2 seconds   INR 1.07  0.00 - 1.49    No results found.   Assessment/Plan: 78 y/o with hypertension, hyperlipidemia, CAD s/p stent RCA 2006, paroxysmal atrial fibrillation x 1 episode in 2011, biopsy-proven bilateral temporal arteritis with residual 25% visual loss left eye, admitted with visual dysfunction involving the right eye but with a neuro-exam that is not particularly impressive. Although her current visual symptomatology could be a manifestation of her underlying giant cell arteritis,  she also has several risk factors for stroke and I am in favor of obtaining neuro-imaging to exclude the possibility of an acute infarct involving the posterior circulation, specifically the left occipital lobe, as giant cell arteritis-related strokes seem to essentially affect the vertebrobasilar system. Continue aspirin 81 mg for now. Continue IV solumedrol.  Will continue to follow.     Wyatt Portela, MD [facility] [phone]  10/28/2012, 8:37 PM

## 2012-10-29 ENCOUNTER — Inpatient Hospital Stay (HOSPITAL_COMMUNITY): Payer: Medicare Other

## 2012-10-29 DIAGNOSIS — F411 Generalized anxiety disorder: Secondary | ICD-10-CM

## 2012-10-29 DIAGNOSIS — I639 Cerebral infarction, unspecified: Secondary | ICD-10-CM

## 2012-10-29 DIAGNOSIS — I6350 Cerebral infarction due to unspecified occlusion or stenosis of unspecified cerebral artery: Principal | ICD-10-CM

## 2012-10-29 DIAGNOSIS — K219 Gastro-esophageal reflux disease without esophagitis: Secondary | ICD-10-CM

## 2012-10-29 DIAGNOSIS — M316 Other giant cell arteritis: Secondary | ICD-10-CM

## 2012-10-29 DIAGNOSIS — I2510 Atherosclerotic heart disease of native coronary artery without angina pectoris: Secondary | ICD-10-CM

## 2012-10-29 HISTORY — DX: Cerebral infarction, unspecified: I63.9

## 2012-10-29 LAB — BASIC METABOLIC PANEL
Calcium: 9.1 mg/dL (ref 8.4–10.5)
Chloride: 94 mEq/L — ABNORMAL LOW (ref 96–112)
Creatinine, Ser: 0.67 mg/dL (ref 0.50–1.10)
GFR calc Af Amer: >90 mL/min (ref >90)
GFR calc non Af Amer: 83 mL/min — ABNORMAL LOW (ref >90)

## 2012-10-29 LAB — CBC
Platelets: 173 10*3/uL (ref 150–400)
RDW: 13 % (ref 11.5–15.5)
WBC: 7.1 10*3/uL (ref 4.0–10.5)

## 2012-10-29 LAB — URINALYSIS, ROUTINE W REFLEX MICROSCOPIC
Nitrite: NEGATIVE
Specific Gravity, Urine: 1.014 (ref 1.005–1.030)
Urobilinogen, UA: 0.2 mg/dL (ref 0.0–1.0)
pH: 6.5 (ref 5.0–8.0)

## 2012-10-29 LAB — URINE MICROSCOPIC-ADD ON

## 2012-10-29 LAB — C-REACTIVE PROTEIN: CRP: 0.7 mg/dL — ABNORMAL HIGH (ref <0.60)

## 2012-10-29 MED ORDER — POTASSIUM CHLORIDE CRYS ER 20 MEQ PO TBCR
40.0000 meq | EXTENDED_RELEASE_TABLET | Freq: Once | ORAL | Status: AC
  Administered 2012-10-29: 40 meq via ORAL
  Filled 2012-10-29: qty 2

## 2012-10-29 MED ORDER — METOPROLOL TARTRATE 25 MG PO TABS
25.0000 mg | ORAL_TABLET | Freq: Two times a day (BID) | ORAL | Status: DC
  Administered 2012-10-29 – 2012-10-30 (×2): 25 mg via ORAL
  Filled 2012-10-29 (×3): qty 1

## 2012-10-29 MED ORDER — CLOPIDOGREL BISULFATE 75 MG PO TABS
75.0000 mg | ORAL_TABLET | Freq: Every day | ORAL | Status: DC
  Administered 2012-10-29 – 2012-10-30 (×2): 75 mg via ORAL
  Filled 2012-10-29 (×2): qty 1

## 2012-10-29 NOTE — Progress Notes (Signed)
Patient reports that eye site is better this am.

## 2012-10-29 NOTE — Evaluation (Signed)
Physical Therapy Evaluation Patient Details Name: Betty Jackson MRN: [id number] DOB: 11-19-1934 Today's Date: 10/29/2012 Time:  -     PT Assessment / Plan / Recommendation History of Present Illness  acute infarct Rt caudate nucleus; possible optic neuritis   Clinical Impression  Pt adm due to Rt visual deficits. MRI revealed acute infarct in Rt caudate nucleus. No focal weakness or balance deficits. Pt able to demo good safety awareness and balance with dynamic gt activities. Does not require acute PT at this time. Educated family and pt on signs and symptoms of stroke. Family and pt appreciative. Will sign off on pt at this time. Pt encouraged to walk around unit with family member as tolerated.     PT Assessment  Patent does not need any further PT services    Follow Up Recommendations  No PT follow up    Does the patient have the potential to tolerate intense rehabilitation      Barriers to Discharge        Equipment Recommendations  None recommended by PT    Recommendations for Other Services     Frequency      Precautions / Restrictions Precautions Precautions: None   Pertinent Vitals/Pain Denies pain       Mobility  Bed Mobility Bed Mobility: Supine to Sit;Sit to Supine Supine to Sit: 7: Independent Sit to Supine: 7: Independent Details for Bed Mobility Assistance: no physical assistance needed; pt demo good technique  Transfers Transfers: Sit to Stand;Stand to Sit Sit to Stand: 7: Independent;From bed Stand to Sit: 7: Independent;To bed Details for Transfer Assistance: no physical (A) needed; pt demo good technique and no LOB noted Ambulation/Gait Ambulation/Gait Assistance: 6: Modified independent (Device/Increase time);5: Supervision Ambulation Distance (Feet): 200 Feet Assistive device: None Ambulation/Gait Assistance Details: supervision for safety with dynamic and high level gt activiries; pt progressed to mod I and demo good safety awareness and technique   Gait Pattern: Within Functional Limits Gait velocity: WFL  Stairs: No Wheelchair Mobility Wheelchair Mobility: No Modified Rankin (Stroke Patients Only) Pre-Morbid Rankin Score: No symptoms Modified Rankin: No significant disability         PT Diagnosis:    PT Problem List:   PT Treatment Interventions:       PT Goals(Current goals can be found in the care plan section) Acute Rehab PT Goals Patient Stated Goal: 'to go home and this eye be better" PT Goal Formulation: No goals set, d/c therapy  Visit Information  Last PT Received On: 10/29/12 Assistance Needed: +1 History of Present Illness: acute infarct Rt caudate nucleus; possible optic neuritis        Prior Functioning  Home Living Family/patient expects to be discharged to:: Private residence Living Arrangements: Children;Other relatives;Spouse/significant other Available Help at Discharge: Family;Available 24 hours/day Type of Home: House Home Access: Level entry Home Layout: Other (Comment) (lives in basement ) Home Equipment: Walker - 2 wheels;Cane - single point;Shower seat;Hand held shower head;Other (comment) (handicap height toilet ) Prior Function Level of Independence: Independent Communication Communication: No difficulties Dominant Hand: Right    Cognition  Cognition Arousal/Alertness: Awake/alert Behavior During Therapy: WFL for tasks assessed/performed Overall Cognitive Status: Within Functional Limits for tasks assessed    Extremity/Trunk Assessment Upper Extremity Assessment Upper Extremity Assessment: Overall WFL for tasks assessed Lower Extremity Assessment Lower Extremity Assessment: Overall WFL for tasks assessed Cervical / Trunk Assessment Cervical / Trunk Assessment: Normal   Balance Balance Balance Assessed: Yes Static Standing Balance Static Standing - Balance  Support: No upper extremity supported;During functional activity Static Standing - Level of Assistance: 6: Modified  independent (Device/Increase time) High Level Balance High Level Balance Activites: Direction changes;Turns;Sudden stops;Head turns High Level Balance Comments: pt able to demo good technique and safety awarenes with dynamic gt activities; no LOB noted   End of Session PT - End of Session Activity Tolerance: Patient tolerated treatment well Patient left: in bed;with call bell/phone within reach;with family/visitor present Nurse Communication: Mobility status  GP     Donell Sievert, [place] [phone] 10/29/2012, 3:25 PM

## 2012-10-29 NOTE — Progress Notes (Signed)
Patient ID: Betty Jackson  female  ZOX:[id number]    DOB: 07/05/1934    DOA: 10/28/2012  PCP: Kristian Covey, MD  Assessment/Plan:  visual changes/ Temporal arteritis - Received IV Solu-Medrol 3 doses, started on prednisone 60 mg daily today - Appreciate ophthalmology and neurology assistance - Reviewed ophthalmology recommendations her outpatient workup in the office ( OCT of the optic nerve and macula OU  Visual Field testing, Fluorocein Angiogram by Retina Specialist) -  MRI of the brain was done which showed a punctate area of infarct in right caudate nucleus - Followup with her rheumatologist at [facility] outpatient  Acute CVA /punctate infarct and right caudate nucleus - MRI of the brain showed a punctate area of restricted diffusion evident in right caudate nucleus no other acute infarcts, increased T2 signal in the right optic nerve compatible with optic neuritis/infarction is considered as well. - MRA brain showed minimal atherosclerotic changes most evident in the right cavernous carotid artery proximal to left posterior cerebral artery without significant change -Obtain 2-D echo, carotid Dopplers  - PT, OT evaluation - Patient is tolerating diet without any difficulty - Obtain lipid panel, hemoglobin A1c. Started on Plavix as per neurology recommendations, continue statins.  Hypertension Continue home regimen of Norvasc, Hyzaar, Lopressor.   hyperlipidemia  Continue statin.   coronary artery disease  Stable. Continue Hyzaar, Norvasc, Lopressor, aspirin.    depression/anxiety  Stable. Continue home regimen of Xanax as needed.   DVT Prophylaxis:Lovenox  Code Status:  Family communication- discussed in detail with the patient and her daughter in the room    Subjective: Reports that she felt improvement in her vision when she woke up however when she ambulated she felt no significant improvement. Denies any headache, numbness or tingling, focal weakness  Objective: Weight  change:   Intake/Output Summary (Last 24 hours) at 10/29/12 1310 Last data filed at 10/29/12 0900  Gross per 24 hour  Intake    480 ml  Output    200 ml  Net    280 ml   Blood pressure 130/70, pulse 64, temperature 98.3 F (36.8 C), temperature source Oral, resp. rate 20, height 5' 4.5" (1.638 m), weight 63.2 kg (139 lb 5.3 oz), SpO2 96.00%.  Physical Exam: General: Alert and awake, oriented x3, not in any acute distress. CVS: S1-S2 clear, no murmur rubs or gallops Chest: clear to auscultation bilaterally, no wheezing, rales or rhonchi Abdomen: soft nontender, nondistended, normal bowel sounds  Extremities: no cyanosis, clubbing or edema noted bilaterally  Lab Results: Basic Metabolic Panel:  Recent Labs Lab 10/28/12 1904 10/29/12 0432  NA 133* 132*  K 3.7 3.3*  CL 96 94*  CO2 27 25  GLUCOSE 178* 156*  BUN 10 14  CREATININE 0.73 0.67  CALCIUM 9.3 9.1  MG 1.7  --    Liver Function Tests:  Recent Labs Lab 10/28/12 1904  AST 27  ALT 16  ALKPHOS 127*  BILITOT 0.6  PROT 7.4  ALBUMIN 3.9   No results found for this basename: LIPASE, AMYLASE,  in the last 168 hours No results found for this basename: AMMONIA,  in the last 168 hours CBC:  Recent Labs Lab 10/28/12 1435 10/29/12 0432  WBC 7.7 7.1  NEUTROABS 5.5  --   HGB 11.8* 12.1  HCT 34.0* 35.1*  MCV 84.8 82.4  PLT 163 173   Cardiac Enzymes: No results found for this basename: CKTOTAL, CKMB, CKMBINDEX, TROPONINI,  in the last 168 hours BNP: No components found with this  basename: POCBNP,  CBG: No results found for this basename: GLUCAP,  in the last 168 hours   Micro Results: No results found for this or any previous visit (from the past 240 hour(s)).  Studies/Results: Mr Shirlee Latch ZO Contrast  10/29/2012   *RADIOLOGY REPORT*  Clinical Data:  Temporary right eye vision loss.  The patient has a history of temporal arteritis which has been treated with chronic steroid therapy. Prior steroid dose has  recently been reduced.  MRI HEAD WITHOUT CONTRAST MRA HEAD WITHOUT CONTRAST  Technique:  Multiplanar, multiecho pulse sequences of the brain and surrounding structures were obtained without intravenous contrast. Angiographic images of the head were obtained using MRA technique without contrast.  Comparison:  MRI brain 11/03/2010.  MRI HEAD  Findings:   A punctate area of restricted diffusion is evident within the right caudate nucleus.  No other acute infarcts are present.  Atrophy and diffuse white matter disease is otherwise unchanged. No acute hemorrhage or mass lesion is present.  Flow is present in the major intracranial arteries.  The patient is status post right lens extraction.  There is increased signal within the right optic nerve. There was some signal in the right optic nerve on the previous study.  This is more evident today.  Minimal fluid is present in the left ethmoid air cells.  No obstructing nasopharyngeal lesion is present.  The paranasal sinuses and right mastoid air cells are otherwise clear.  IMPRESSION:  1.  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 2.  Increased T2 signal within the right optic nerve compatible with optic neuritis.  Infarction is considered as well. 3.  Stable atrophy and diffuse white matter disease.  MRA HEAD  Findings: Mild irregularity is again noted within the anterior genu of the right cavernous carotid artery without a discrete aneurysm. The internal carotid arteries are otherwise within normal limits. The A1 and M1 segments are normal.  No definite anterior communicating artery is identified.  The ACA and MCA branch vessels are within normal limits.  The left vertebral artery is the dominant vessel.  Left PICA origin is visualized.  Right AICA is dominant.  The basilar artery is within normal limits.  Mild narrowing of proximal left P1 segment is stable.  The PCA branch vessels are otherwise within normal limits.  IMPRESSION:  1.  Minimal  atherosclerotic changes are most evident in the right cavernous carotid artery proximal left posterior cerebral artery without significant change. 2.  No significant proximal stenosis, aneurysm, or branch vessel occlusion.   Original Report Authenticated By: Marin Roberts, M.D.   Mr Brain Wo Contrast  10/29/2012   *RADIOLOGY REPORT*  Clinical Data:  Temporary right eye vision loss.  The patient has a history of temporal arteritis which has been treated with chronic steroid therapy. Prior steroid dose has recently been reduced.  MRI HEAD WITHOUT CONTRAST MRA HEAD WITHOUT CONTRAST  Technique:  Multiplanar, multiecho pulse sequences of the brain and surrounding structures were obtained without intravenous contrast. Angiographic images of the head were obtained using MRA technique without contrast.  Comparison:  MRI brain 11/03/2010.  MRI HEAD  Findings:   A punctate area of restricted diffusion is evident within the right caudate nucleus.  No other acute infarcts are present.  Atrophy and diffuse white matter disease is otherwise unchanged. No acute hemorrhage or mass lesion is present.  Flow is present in the major intracranial arteries.  The patient is status post right lens extraction.  There  is increased signal within the right optic nerve. There was some signal in the right optic nerve on the previous study.  This is more evident today.  Minimal fluid is present in the left ethmoid air cells.  No obstructing nasopharyngeal lesion is present.  The paranasal sinuses and right mastoid air cells are otherwise clear.  IMPRESSION:  1.  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 2.  Increased T2 signal within the right optic nerve compatible with optic neuritis.  Infarction is considered as well. 3.  Stable atrophy and diffuse white matter disease.  MRA HEAD  Findings: Mild irregularity is again noted within the anterior genu of the right cavernous carotid artery without a discrete aneurysm.  The internal carotid arteries are otherwise within normal limits. The A1 and M1 segments are normal.  No definite anterior communicating artery is identified.  The ACA and MCA branch vessels are within normal limits.  The left vertebral artery is the dominant vessel.  Left PICA origin is visualized.  Right AICA is dominant.  The basilar artery is within normal limits.  Mild narrowing of proximal left P1 segment is stable.  The PCA branch vessels are otherwise within normal limits.  IMPRESSION:  1.  Minimal atherosclerotic changes are most evident in the right cavernous carotid artery proximal left posterior cerebral artery without significant change. 2.  No significant proximal stenosis, aneurysm, or branch vessel occlusion.   Original Report Authenticated By: Marin Roberts, M.D.    Medications: Scheduled Meds: . amLODipine  5 mg Oral Daily  . atorvastatin  20 mg Oral q1800  . clopidogrel  75 mg Oral Q breakfast  . enoxaparin (LOVENOX) injection  40 mg Subcutaneous Q24H  . losartan  50 mg Oral Daily   And  . hydrochlorothiazide  12.5 mg Oral Daily  . metoprolol  25 mg Oral BID  . pantoprazole  40 mg Oral Q0600  . potassium chloride  40 mEq Oral Once  . predniSONE  60 mg Oral QAC breakfast  . sodium chloride  3 mL Intravenous Q12H  . sodium chloride  3 mL Intravenous Q12H      LOS: 1 day   Alohilani Levenhagen M.D. [facility] 10/29/2012, 1:10 PM Pager: [phone]  If 7PM-7AM, please contact night-coverage [email or web address] Password [email or web address]

## 2012-10-29 NOTE — Progress Notes (Signed)
Subjective: Patient continues to complain of a decrease in her peripheral vision overall.  There is no particular field cut described.  MRI of the brain has been reviewed and shows a small right punctate caudate acute infarct.  This is not likely causing her visual symptoms.    Objective: Current vital signs: BP 130/70  Pulse 64  Temp(Src) 98.3 F (36.8 C) (Oral)  Resp 20  Ht 5' 4.5" (1.638 m)  Wt 63.2 kg (139 lb 5.3 oz)  BMI 23.56 kg/m2  SpO2 96% Vital signs in last 24 hours: Temp:  [97.9 F (36.6 C)-98.3 F (36.8 C)] 98.3 F (36.8 C) (08/02 0520) Pulse Rate:  [60-79] 64 (08/02 0935) Resp:  [16-20] 20 (08/02 0520) BP: (130-161)/(54-76) 130/70 mmHg (08/02 0933) SpO2:  [95 %-100 %] 96 % (08/02 0520) Weight:  [62.143 kg (137 lb)-63.7 kg (140 lb 6.9 oz)] 63.2 kg (139 lb 5.3 oz) (08/02 0648)  Intake/Output from previous day:   Intake/Output this shift: Total I/O In: 480 [P.O.:480] Out: 200 [Urine:200] Nutritional status: Cardiac  Neurologic Exam: Mental Status:  Alert, awake,oriented x 4, thought content appropriate. Comprehension, naming, and repetition intact. Speech fluent without evidence of aphasia.  Cranial Nerves:  II: Discs flat bilaterally; Visual fields grossly normal, pupils equal, round, reactive to light and accommodation  III,IV, VI: ptosis not present, extra-ocular motions intact bilaterally  V,VII: smile symmetric, facial light touch sensation normal bilaterally  VIII: hearing normal bilaterally  IX,X: gag reflex present  XI: bilateral shoulder shrug  XII: midline tongue extension  Motor:  Right : Upper extremity 5/5         Left: Upper extremity 5/5   Lower extremity 5/5      Lower extremity 5/5  Tone and bulk:normal tone throughout; no atrophy noted  Sensory: Pinprick and light touch intact throughout, bilaterally  Deep Tendon Reflexes:  2+ throughout   Lab Results: Basic Metabolic Panel:  Recent Labs Lab 10/28/12 1435 10/28/12 1904  10/29/12 0432  NA 135 133* 132*  K 3.7 3.7 3.3*  CL 96 96 94*  CO2 29 27 25   GLUCOSE 98 178* 156*  BUN 11 10 14   CREATININE 0.70 0.73 0.67  CALCIUM 10.0 9.3 9.1  MG  --  1.7  --     Liver Function Tests:  Recent Labs Lab 10/28/12 1904  AST 27  ALT 16  ALKPHOS 127*  BILITOT 0.6  PROT 7.4  ALBUMIN 3.9   No results found for this basename: LIPASE, AMYLASE,  in the last 168 hours No results found for this basename: AMMONIA,  in the last 168 hours  CBC:  Recent Labs Lab 10/28/12 1435 10/29/12 0432  WBC 7.7 7.1  NEUTROABS 5.5  --   HGB 11.8* 12.1  HCT 34.0* 35.1*  MCV 84.8 82.4  PLT 163 173    Cardiac Enzymes: No results found for this basename: CKTOTAL, CKMB, CKMBINDEX, TROPONINI,  in the last 168 hours  Lipid Panel: No results found for this basename: CHOL, TRIG, HDL, CHOLHDL, VLDL, LDLCALC,  in the last 168 hours  CBG: No results found for this basename: GLUCAP,  in the last 168 hours  Microbiology: Results for orders placed during the hospital encounter of 01/23/12  URINE CULTURE     Status: None   Collection Time    01/23/12 10:13 PM      Result Value Range Status   Specimen Description URINE, RANDOM   Final   Special Requests NONE   Final   Culture  Setup Time 01/24/2012 02:01   Final   Colony Count NO GROWTH   Final   Culture NO GROWTH   Final   Report Status 01/25/2012 FINAL   Final    Coagulation Studies:  Recent Labs  10/28/12 1904  LABPROT 13.7  INR 1.07    Imaging: Mr Maxine Glenn Head Wo Contrast  10/29/2012   *RADIOLOGY REPORT*  Clinical Data:  Temporary right eye vision loss.  The patient has a history of temporal arteritis which has been treated with chronic steroid therapy. Prior steroid dose has recently been reduced.  MRI HEAD WITHOUT CONTRAST MRA HEAD WITHOUT CONTRAST  Technique:  Multiplanar, multiecho pulse sequences of the brain and surrounding structures were obtained without intravenous contrast. Angiographic images of the head were  obtained using MRA technique without contrast.  Comparison:  MRI brain 11/03/2010.  MRI HEAD  Findings:   A punctate area of restricted diffusion is evident within the right caudate nucleus.  No other acute infarcts are present.  Atrophy and diffuse white matter disease is otherwise unchanged. No acute hemorrhage or mass lesion is present.  Flow is present in the major intracranial arteries.  The patient is status post right lens extraction.  There is increased signal within the right optic nerve. There was some signal in the right optic nerve on the previous study.  This is more evident today.  Minimal fluid is present in the left ethmoid air cells.  No obstructing nasopharyngeal lesion is present.  The paranasal sinuses and right mastoid air cells are otherwise clear.  IMPRESSION:  1.  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 2.  Increased T2 signal within the right optic nerve compatible with optic neuritis.  Infarction is considered as well. 3.  Stable atrophy and diffuse white matter disease.  MRA HEAD  Findings: Mild irregularity is again noted within the anterior genu of the right cavernous carotid artery without a discrete aneurysm. The internal carotid arteries are otherwise within normal limits. The A1 and M1 segments are normal.  No definite anterior communicating artery is identified.  The ACA and MCA branch vessels are within normal limits.  The left vertebral artery is the dominant vessel.  Left PICA origin is visualized.  Right AICA is dominant.  The basilar artery is within normal limits.  Mild narrowing of proximal left P1 segment is stable.  The PCA branch vessels are otherwise within normal limits.  IMPRESSION:  1.  Minimal atherosclerotic changes are most evident in the right cavernous carotid artery proximal left posterior cerebral artery without significant change. 2.  No significant proximal stenosis, aneurysm, or branch vessel occlusion.   Original Report Authenticated  By: Marin Roberts, M.D.   Mr Brain Wo Contrast  10/29/2012   *RADIOLOGY REPORT*  Clinical Data:  Temporary right eye vision loss.  The patient has a history of temporal arteritis which has been treated with chronic steroid therapy. Prior steroid dose has recently been reduced.  MRI HEAD WITHOUT CONTRAST MRA HEAD WITHOUT CONTRAST  Technique:  Multiplanar, multiecho pulse sequences of the brain and surrounding structures were obtained without intravenous contrast. Angiographic images of the head were obtained using MRA technique without contrast.  Comparison:  MRI brain 11/03/2010.  MRI HEAD  Findings:   A punctate area of restricted diffusion is evident within the right caudate nucleus.  No other acute infarcts are present.  Atrophy and diffuse white matter disease is otherwise unchanged. No acute hemorrhage or mass lesion is present.  Flow is  present in the major intracranial arteries.  The patient is status post right lens extraction.  There is increased signal within the right optic nerve. There was some signal in the right optic nerve on the previous study.  This is more evident today.  Minimal fluid is present in the left ethmoid air cells.  No obstructing nasopharyngeal lesion is present.  The paranasal sinuses and right mastoid air cells are otherwise clear.  IMPRESSION:  1.  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 2.  Increased T2 signal within the right optic nerve compatible with optic neuritis.  Infarction is considered as well. 3.  Stable atrophy and diffuse white matter disease.  MRA HEAD  Findings: Mild irregularity is again noted within the anterior genu of the right cavernous carotid artery without a discrete aneurysm. The internal carotid arteries are otherwise within normal limits. The A1 and M1 segments are normal.  No definite anterior communicating artery is identified.  The ACA and MCA branch vessels are within normal limits.  The left vertebral artery is the  dominant vessel.  Left PICA origin is visualized.  Right AICA is dominant.  The basilar artery is within normal limits.  Mild narrowing of proximal left P1 segment is stable.  The PCA branch vessels are otherwise within normal limits.  IMPRESSION:  1.  Minimal atherosclerotic changes are most evident in the right cavernous carotid artery proximal left posterior cerebral artery without significant change. 2.  No significant proximal stenosis, aneurysm, or branch vessel occlusion.   Original Report Authenticated By: Marin Roberts, M.D.    Medications:  I have reviewed the patient's current medications. Scheduled: . amLODipine  5 mg Oral Daily  . aspirin EC  81 mg Oral QPM  . atorvastatin  20 mg Oral q1800  . enoxaparin (LOVENOX) injection  40 mg Subcutaneous Q24H  . losartan  50 mg Oral Daily   And  . hydrochlorothiazide  12.5 mg Oral Daily  . metoprolol  50 mg Oral BID  . pantoprazole  40 mg Oral Q0600  . predniSONE  60 mg Oral QAC breakfast  . sodium chloride  3 mL Intravenous Q12H  . sodium chloride  3 mL Intravenous Q12H    Assessment/Plan: 79 year old female with a history of TA on steroid taper who presents with complaints of visual worsening.  Steroids have been increased.  Vision unchanged.  MRI shows an acute infarct.  Stroke work up indicated.  Recommendations: 1. HgbA1c, fasting lipid panel 2. PT consult, OT consult, Speech consult 3. Echocardiogram 4. Carotid dopplers 5. Prophylactic therapy-Would d/c ASA and start Plavix 75mg  daily 6. Risk factor modification 7. Telemetry monitoring 8. Frequent neuro checks 9. D/C IV steroids 10. Continue Prednisone at 60mg  daily   LOS: 1 day   Thana Farr, MD [facility] [phone] 10/29/2012  10:24 AM

## 2012-10-29 NOTE — Progress Notes (Signed)
*  PRELIMINARY RESULTS* Vascular Ultrasound Carotid Duplex (Doppler) has been completed.  Preliminary findings: Bilateral:  Less than 39% ICA stenosis.  Vertebral artery flow is antegrade.     Farrel Demark, RDMS, RVT  10/29/2012, 12:27 PM

## 2012-10-30 DIAGNOSIS — D649 Anemia, unspecified: Secondary | ICD-10-CM

## 2012-10-30 DIAGNOSIS — F329 Major depressive disorder, single episode, unspecified: Secondary | ICD-10-CM

## 2012-10-30 DIAGNOSIS — I6789 Other cerebrovascular disease: Secondary | ICD-10-CM

## 2012-10-30 LAB — URINE CULTURE

## 2012-10-30 LAB — LIPID PANEL
Total CHOL/HDL Ratio: 2.1 RATIO
VLDL: 17 mg/dL (ref 0–40)

## 2012-10-30 LAB — SEDIMENTATION RATE: Sed Rate: 25 mm/hr — ABNORMAL HIGH (ref 0–22)

## 2012-10-30 LAB — BASIC METABOLIC PANEL
BUN: 16 mg/dL (ref 6–23)
CO2: 25 mEq/L (ref 19–32)
Chloride: 93 mEq/L — ABNORMAL LOW (ref 96–112)
Creatinine, Ser: 0.62 mg/dL (ref 0.50–1.10)

## 2012-10-30 MED ORDER — ALPRAZOLAM 0.5 MG PO TABS
0.5000 mg | ORAL_TABLET | Freq: Once | ORAL | Status: AC
  Administered 2012-10-30: 0.5 mg via ORAL

## 2012-10-30 MED ORDER — PREDNISONE 20 MG PO TABS: 60.0000 mg | ORAL_TABLET | Freq: Every day | ORAL | Status: DC

## 2012-10-30 MED ORDER — CLOPIDOGREL BISULFATE 75 MG PO TABS: 75.0000 mg | ORAL_TABLET | Freq: Every day | ORAL | Status: DC

## 2012-10-30 MED ORDER — PANTOPRAZOLE SODIUM 40 MG PO TBEC: 40.0000 mg | DELAYED_RELEASE_TABLET | Freq: Every day | ORAL | Status: DC

## 2012-10-30 MED ORDER — POTASSIUM CHLORIDE CRYS ER 20 MEQ PO TBCR
40.0000 meq | EXTENDED_RELEASE_TABLET | Freq: Once | ORAL | Status: AC
  Administered 2012-10-30: 40 meq via ORAL
  Filled 2012-10-30: qty 2

## 2012-10-30 NOTE — Progress Notes (Signed)
  Echocardiogram 2D Echocardiogram has been performed.  Betty Jackson 10/30/2012, 11:37 AM

## 2012-10-30 NOTE — Discharge Summary (Signed)
Physician Discharge Summary  Patient ID: Betty Jackson MRN: [id number] DOB/AGE: 1934/07/04 78 y.o.  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 Temporal arteritis . Visual changes . Hypertension . Hyperlipidemia . CAD (coronary artery disease) . Depression . Anxiety state, unspecified . Anemia, unspecified  Consults:  Ophthalmology, Dr. Gwen Pounds                    Neurology    Recommendations for Outpatient Follow-up:  1. patient was placed on Plavix due to acute/new CVA 2. She's currently on high-dose prednisone 60 mg daily, will need to have ESR checked this week.    Allergies:   Allergies  Allergen Reactions  . Tussionex Pennkinetic Er (Hydrocod Polst-Cpm Polst Er) Hives     Discharge Medications:   Medication List    STOP taking these medications       aspirin EC 81 MG tablet      TAKE these medications       ALPRAZolam 0.5 MG tablet  Commonly known as:  XANAX  Take 1 tablet (0.5 mg total) by mouth at bedtime as needed for sleep.     amLODipine 5 MG tablet  Commonly known as:  NORVASC  Take 5 mg by mouth daily.     atorvastatin 20 MG tablet  Commonly known as:  LIPITOR  Take 20 mg by mouth daily.     calcium carbonate 600 MG Tabs  Commonly known as:  OS-CAL  Take 600 mg by mouth daily.     cholecalciferol 1000 UNITS tablet  Commonly known as:  VITAMIN D  Take 1,000 Units by mouth 2 (two) times daily.     clopidogrel 75 MG tablet  Commonly known as:  PLAVIX  Take 1 tablet (75 mg total) by mouth daily with breakfast.     glucose blood test strip  Commonly known as:  FREESTYLE LITE  - Patient testing BS once daily.  - Diag code 446.5     HYDROcodone-acetaminophen 5-325 MG per tablet  Commonly known as:  NORCO/VICODIN  Take 1 tablet by mouth every 6 (six) hours as needed for pain.     JUICE PLUS FIBRE  PO  Take 1 capsule by mouth daily.     losartan-hydrochlorothiazide 50-12.5 MG per tablet  Commonly known as:  HYZAAR  Take 1 tablet by mouth daily.     metoprolol tartrate 25 MG tablet  Commonly known as:  LOPRESSOR  Take 25 mg by mouth 2 (two) times daily.     pantoprazole 40 MG tablet  Commonly known as:  PROTONIX  Take 1 tablet (40 mg total) by mouth daily.     predniSONE 20 MG tablet  Commonly known as:  DELTASONE  Take 3 tablets (60 mg total) by mouth daily before breakfast.     temazepam 15 MG capsule  Commonly known as:  RESTORIL  Take 15 mg by mouth at bedtime as needed for sleep.     vitamin C 500 MG tablet  Commonly known as:  ASCORBIC ACID  Take 1,000 mg by mouth daily. For sore throat     ZINC PO  Take 1 capsule by mouth daily.         Brief H and P: For complete details please refer to admission H and P, but in brief Betty Jackson is a 78 y.o. female with past medical history  of temporal arteritis which was diagnosed 2 years ago in [place] via temporal arteritis biopsy and elevated sedimentation rate with 25% visual loss in the left eye, history of hypertension, hyperlipidemia, headache, depression/anxiety who presents [facility] with complaints of visual changes. Patient has been followed by rheumatologist Dr Clovis Riley at [facility], had been on prednisone however per patient she is been undergoing a prednisone taper over the past 2 years and currently on 1 mg daily.  Patient reported that she was driving from the hairdresser on the day of admission when she started experiencing tunnel vision in the right eye, blurriness, and some right eye pain. Patient denied any headaches, no diplopia, no jaw pain, no fever, no chills, no fatigue, no chest pain, no shortness of breath, no nausea, no vomiting, no abdominal pain, no diarrhea, no constipation, no dysuria, no weakness, no cough. Patient denied any other associated symptoms. Patient was  seen in the emergency room, ED physician spoke with neurologist on call due to concern for temporal arteritis flare. Patient was given 60 mg of IV Solu-Medrol x1. Also of note patient had a sedimentation rate which was done on 10/24/2012 which was elevated at 56. Patient was admitted for further workup   Hospital Course:  Patient is a 78 year old female with history of hypertension, hyperlipidemia, CAD, paroxysmal A. fib, biopsy-proven bilateral temporal arteritis followed by rheumatology at [facility] presented with new onset visual dysfunction involving her right eye. Patient was on prednisone taper at the time of admission. She was admitted for further workup. visual changes/ Temporal arteritis: Patient received IV Solu-Medrol 3 doses, started on prednisone 60 mg daily on 10/29/12. Neurology and ophthalmology consultation was obtained. Patient was seen by Dr. Gwen Pounds and recommended further outpatient workup in the office ( OCT of the optic nerve and macula OU Visual Field testing, Fluorocein Angiogram by Retina Specialist). Per neurology recommendation, MRI of the brain was done which showed a punctate area of infarct in right caudate nucleus which prompted a full stroke workup. ESR at discharge is 25 and was 39 on 10/28/12, 56 on 10/24/12. She will followup with her rheumatologist at [facility] outpatient and neurology in [place]..  Acute CVA /punctate infarct and right caudate nucleus  - MRI of the brain showed a punctate area of restricted diffusion evident in right caudate nucleus no other acute infarcts, increased T2 signal in the right optic nerve compatible with optic neuritis/infarction is considered as well.  - MRA brain showed minimal atherosclerotic changes most evident in the right cavernous carotid artery proximal to left posterior cerebral artery without significant change  - Patient was placed on Plavix per neurology recommendations.  - Carotid Dopplers showed less than 39% ICA  stenosis. 2-D echo showed EF of 65-70%, normal wall motion, no regional wall motion abnormalities. Lipid panel showed LDL of 45, cholesterol of 454, patient already on statins. She does not need any outpatient physical therapy, tolerating diet without any difficulty.  Hypertension  Continue home regimen of Norvasc, Hyzaar, Lopressor.  hyperlipidemia  Continue statin.  coronary artery disease  Stable. Continue Hyzaar, Norvasc, Lopressor, aspirin.    Day of Discharge BP 130/60  Pulse 65  Temp(Src) 97.3 F (36.3 C) (Oral)  Resp 18  Ht 5' 4.5" (1.638 m)  Wt 62.823 kg (138 lb 8 oz)  BMI 23.41 kg/m2  SpO2 98%  Physical Exam: General: Alert and awake oriented x3 not in any acute distress. HEENT: anicteric sclera, pupils reactive to light  and accommodation CVS: S1-S2 clear no murmur rubs or gallops Chest: clear to auscultation bilaterally, no wheezing rales or rhonchi Abdomen: soft nontender, nondistended, normal bowel sounds, no organomegaly Extremities: no cyanosis, clubbing or edema noted bilaterally Neuro: Cranial nerves II-XII intact, no focal neurological deficits   The results of significant diagnostics from this hospitalization (including imaging, microbiology, ancillary and laboratory) are listed below for reference.    LAB RESULTS: Basic Metabolic Panel:  Recent Labs Lab 10/28/12 1904 10/29/12 0432 10/30/12 0510  NA 133* 132* 128*  K 3.7 3.3* 3.3*  CL 96 94* 93*  CO2 27 25 25   GLUCOSE 178* 156* 113*  BUN 10 14 16   CREATININE 0.73 0.67 0.62  CALCIUM 9.3 9.1 8.8  MG 1.7  --   --    Liver Function Tests:  Recent Labs Lab 10/28/12 1904  AST 27  ALT 16  ALKPHOS 127*  BILITOT 0.6  PROT 7.4  ALBUMIN 3.9   CBC:  Recent Labs Lab 10/28/12 1435 10/29/12 0432  WBC 7.7 7.1  NEUTROABS 5.5  --   HGB 11.8* 12.1  HCT 34.0* 35.1*  MCV 84.8 82.4  PLT 163 173     Significant Diagnostic Studies:  Mr Lucretia Kern Contrast  10/29/2012   *RADIOLOGY REPORT*   Clinical Data:  Temporary right eye vision loss.  The patient has a history of temporal arteritis which has been treated with chronic steroid therapy. Prior steroid dose has recently been reduced.  MRI HEAD WITHOUT CONTRAST MRA HEAD WITHOUT CONTRAST  Technique:  Multiplanar, multiecho pulse sequences of the brain and surrounding structures were obtained without intravenous contrast. Angiographic images of the head were obtained using MRA technique without contrast.  Comparison:  MRI brain 11/03/2010.  MRI HEAD  Findings:   A punctate area of restricted diffusion is evident within the right caudate nucleus.  No other acute infarcts are present.  Atrophy and diffuse white matter disease is otherwise unchanged. No acute hemorrhage or mass lesion is present.  Flow is present in the major intracranial arteries.  The patient is status post right lens extraction.  There is increased signal within the right optic nerve. There was some signal in the right optic nerve on the previous study.  This is more evident today.  Minimal fluid is present in the left ethmoid air cells.  No obstructing nasopharyngeal lesion is present.  The paranasal sinuses and right mastoid air cells are otherwise clear.  IMPRESSION:  1.  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 2.  Increased T2 signal within the right optic nerve compatible with optic neuritis.  Infarction is considered as well. 3.  Stable atrophy and diffuse white matter disease.    MRA HEAD  Findings: Mild irregularity is again noted within the anterior genu of the right cavernous carotid artery without a discrete aneurysm. The internal carotid arteries are otherwise within normal limits. The A1 and M1 segments are normal.  No definite anterior communicating artery is identified.  The ACA and MCA branch vessels are within normal limits.  The left vertebral artery is the dominant vessel.  Left PICA origin is visualized.  Right AICA is dominant.  The basilar  artery is within normal limits.  Mild narrowing of proximal left P1 segment is stable.  The PCA branch vessels are otherwise within normal limits.  IMPRESSION:  1.  Minimal atherosclerotic changes are most evident in the right cavernous carotid artery proximal left posterior cerebral artery without significant change. 2.  No significant proximal stenosis, aneurysm, or branch vessel occlusion.   Original Report Authenticated By: Marin Roberts, M.D.   Carotid Duplex (Doppler):  Bilateral: Less than 39% ICA stenosis. Vertebral artery flow is antegrade.   2D ECHO: Study Conclusions  - Left ventricle: The cavity size was normal. Wall thickness was normal. Systolic function was vigorous. The estimated ejection fraction was in the range of 65% to 70%. Wall motion was normal; there were no regional wall motion abnormalities.    Disposition and Follow-up:    DISPOSITION: Home DIET: Heart healthy diet ACTIVITY: As tolerated TESTS THAT NEED FOLLOW-UP ESR out-patient  DISCHARGE FOLLOW-UP Follow-up Information   Follow up with Trish Fountain, MD. Schedule an appointment as soon as possible for a visit in 1 week. (please call tomorrow to set up appointment)    Contact information:   [address] [place] G[place] [address] [phone]       Follow up with Gates Rigg, MD. Schedule an appointment as soon as possible for a visit in 2 weeks. (for follow-up of CVA and temporal arteritis )    Contact information:   [address] [place] [address] [phone]       Follow up with Sigurd Sos, MD. Schedule an appointment as soon as possible for a visit in 1 week.   Contact informationMarland Kitchen   [facility] [place] [address] [phone]       Follow up with Kristian Covey, MD. Schedule an appointment as soon as possible for a visit in 10 days.   Contact information:   [address] [place] [address] [phone]        Time spent on Discharge: 40 mins  Signed:   Corderius Saraceni M.D. [facility] 10/30/2012, 12:28 PM Pager: [phone]

## 2012-10-30 NOTE — Progress Notes (Signed)
Echo Department paged . Will follow up accordingly.

## 2012-10-30 NOTE — Progress Notes (Signed)
Stroke Team Progress Note  HISTORY  Betty Jackson is an 78 y.o. female, right handed, with a past medical history significant for hypertension, hyperlipidemia, CAD s/p stent RCA 2006, paroxysmal atrial fibrillation x 1 episode in 2011, depression, anxiety, GERD, biopsy-proven bilateral temporal arteritis with residual 25% visual loss left eye followed by rheumatologist Dr. Clovis Riley at [facility], comes in today complaining of new onset visual dysfunction involving her right eye.  She has been gradually tapering off prednisone and is was taking 1 mg prednisone daily at the time of this admission.  Betty Jackson expressed that last night she had " an unusual sensation of things not quite right in my right eye" but when awoke this morning there was nothing particularly wrong with her vision. However, around noon time today she was driving and noticed that she was experiencing tunnel vision and her peripheral vision in the right eye was impaired and " kind of fuzzy". She recalls having some pain behind the right eye and perhaps mild eyelid droopiness but denies frank headache, nausea, vomiting, vertigo, double vision, focal weakness or numbness, slurred speech, unsteadiness, confusion, or language impairment.  She had a sed rate of 56 On 10/24/12 and when she presented to [facility] ED she was administered 60 mg IV solumedrol x 1.  Presently, she said that she feels fine but her right eye vision is not 100% back.  Takes aspirin 81 mg daily and atorvastatin 20 mg.   Patient was not a TPA candidate secondary to minimal symptoms.   SUBJECTIVE The patient indicates that her right eye is slightly more blurred today. The patient is able to read. No headaches at this point.  OBJECTIVE Most recent Vital Signs: Filed Vitals:   10/29/12 0935 10/29/12 1341 10/29/12 2100 10/30/12 0500  BP:  129/58 132/60 136/52  Pulse: 64 92 81 70  Temp:  98.5 F (36.9 C) 97.7 F (36.5 C) 97.3 F (36.3 C)  TempSrc:  Oral     Resp:  18 18 18   Height:      Weight:    138 lb 8 oz (62.823 kg)  SpO2:  97% 98% 98%   CBG (last 3)  No results found for this basename: GLUCAP,  in the last 72 hours  IV Fluid Intake:     MEDICATIONS  . amLODipine  5 mg Oral Daily  . atorvastatin  20 mg Oral q1800  . clopidogrel  75 mg Oral Q breakfast  . enoxaparin (LOVENOX) injection  40 mg Subcutaneous Q24H  . losartan  50 mg Oral Daily   And  . hydrochlorothiazide  12.5 mg Oral Daily  . metoprolol  25 mg Oral BID  . pantoprazole  40 mg Oral Q0600  . predniSONE  60 mg Oral QAC breakfast  . sodium chloride  3 mL Intravenous Q12H  . sodium chloride  3 mL Intravenous Q12H   PRN:  sodium chloride, acetaminophen, acetaminophen, ALPRAZolam, alum & mag hydroxide-simeth, gi cocktail, HYDROcodone-acetaminophen, morphine injection, ondansetron (ZOFRAN) IV, ondansetron, senna-docusate, sodium chloride, sorbitol, zolpidem  Diet:  Cardiac thin liquids Activity:   Up without assist DVT Prophylaxis:  Lovenox  CLINICALLY SIGNIFICANT STUDIES Basic Metabolic Panel:  Recent Labs Lab 10/28/12 1904 10/29/12 0432 10/30/12 0510  NA 133* 132* 128*  K 3.7 3.3* 3.3*  CL 96 94* 93*  CO2 27 25 25   GLUCOSE 178* 156* 113*  BUN 10 14 16   CREATININE 0.73 0.67 0.62  CALCIUM 9.3 9.1 8.8  MG 1.7  --   --  Liver Function Tests:  Recent Labs Lab 10/28/12 1904  AST 27  ALT 16  ALKPHOS 127*  BILITOT 0.6  PROT 7.4  ALBUMIN 3.9   CBC:  Recent Labs Lab 10/28/12 1435 10/29/12 0432  WBC 7.7 7.1  NEUTROABS 5.5  --   HGB 11.8* 12.1  HCT 34.0* 35.1*  MCV 84.8 82.4  PLT 163 173   Coagulation:  Recent Labs Lab 10/28/12 1904  LABPROT 13.7  INR 1.07   Cardiac Enzymes: No results found for this basename: CKTOTAL, CKMB, CKMBINDEX, TROPONINI,  in the last 168 hours Urinalysis:  Recent Labs Lab 10/29/12 0919  COLORURINE YELLOW  LABSPEC 1.014  PHURINE 6.5  GLUCOSEU NEGATIVE  HGBUR SMALL*  BILIRUBINUR NEGATIVE  KETONESUR  NEGATIVE  PROTEINUR NEGATIVE  UROBILINOGEN 0.2  NITRITE NEGATIVE  LEUKOCYTESUR NEGATIVE   Lipid Panel    Component Value Date/Time   CHOL 120 10/30/2012 0510   TRIG 86 10/30/2012 0510   HDL 58 10/30/2012 0510   CHOLHDL 2.1 10/30/2012 0510   VLDL 17 10/30/2012 0510   LDLCALC 45 10/30/2012 0510   HgbA1C  Lab Results  Component Value Date   HGBA1C 5.3 10/29/2012    Urine Drug Screen:   No results found for this basename: labopia, cocainscrnur, labbenz, amphetmu, thcu, labbarb    Alcohol Level: No results found for this basename: ETH,  in the last 168 hours  Mr [facility] Contrast  10/29/2012   *RADIOLOGY REPORT*  Clinical Data:  Temporary right eye vision loss.  The patient has a history of temporal arteritis which has been treated with chronic steroid therapy. Prior steroid dose has recently been reduced.  MRI HEAD WITHOUT CONTRAST MRA HEAD WITHOUT CONTRAST  Technique:  Multiplanar, multiecho pulse sequences of the brain and surrounding structures were obtained without intravenous contrast. Angiographic images of the head were obtained using MRA technique without contrast.  Comparison:  MRI brain 11/03/2010.  MRI HEAD  Findings:   A punctate area of restricted diffusion is evident within the right caudate nucleus.  No other acute infarcts are present.  Atrophy and diffuse white matter disease is otherwise unchanged. No acute hemorrhage or mass lesion is present.  Flow is present in the major intracranial arteries.  The patient is status post right lens extraction.  There is increased signal within the right optic nerve. There was some signal in the right optic nerve on the previous study.  This is more evident today.  Minimal fluid is present in the left ethmoid air cells.  No obstructing nasopharyngeal lesion is present.  The paranasal sinuses and right mastoid air cells are otherwise clear.  IMPRESSION:  1.  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 2.  Increased T2 signal within  the right optic nerve compatible with optic neuritis.  Infarction is considered as well. 3.  Stable atrophy and diffuse white matter disease.  MRA HEAD  Findings: Mild irregularity is again noted within the anterior genu of the right cavernous carotid artery without a discrete aneurysm. The internal carotid arteries are otherwise within normal limits. The A1 and M1 segments are normal.  No definite anterior communicating artery is identified.  The ACA and MCA branch vessels are within normal limits.  The left vertebral artery is the dominant vessel.  Left PICA origin is visualized.  Right AICA is dominant.  The basilar artery is within normal limits.  Mild narrowing of proximal left P1 segment is stable.  The PCA branch vessels are otherwise within  normal limits.  IMPRESSION:  1.  Minimal atherosclerotic changes are most evident in the right cavernous carotid artery proximal left posterior cerebral artery without significant change. 2.  No significant proximal stenosis, aneurysm, or branch vessel occlusion.   Original Report Authenticated By: Marin Roberts, M.D.   Mr Brain Wo Contrast  10/29/2012   *RADIOLOGY REPORT*  Clinical Data:  Temporary right eye vision loss.  The patient has a history of temporal arteritis which has been treated with chronic steroid therapy. Prior steroid dose has recently been reduced.  MRI HEAD WITHOUT CONTRAST MRA HEAD WITHOUT CONTRAST  Technique:  Multiplanar, multiecho pulse sequences of the brain and surrounding structures were obtained without intravenous contrast. Angiographic images of the head were obtained using MRA technique without contrast.  Comparison:  MRI brain 11/03/2010.  MRI HEAD  Findings:   A punctate area of restricted diffusion is evident within the right caudate nucleus.  No other acute infarcts are present.  Atrophy and diffuse white matter disease is otherwise unchanged. No acute hemorrhage or mass lesion is present.  Flow is present in the major  intracranial arteries.  The patient is status post right lens extraction.  There is increased signal within the right optic nerve. There was some signal in the right optic nerve on the previous study.  This is more evident today.  Minimal fluid is present in the left ethmoid air cells.  No obstructing nasopharyngeal lesion is present.  The paranasal sinuses and right mastoid air cells are otherwise clear.  IMPRESSION:  1.  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 2.  Increased T2 signal within the right optic nerve compatible with optic neuritis.  Infarction is considered as well. 3.  Stable atrophy and diffuse white matter disease.  MRA HEAD  Findings: Mild irregularity is again noted within the anterior genu of the right cavernous carotid artery without a discrete aneurysm. The internal carotid arteries are otherwise within normal limits. The A1 and M1 segments are normal.  No definite anterior communicating artery is identified.  The ACA and MCA branch vessels are within normal limits.  The left vertebral artery is the dominant vessel.  Left PICA origin is visualized.  Right AICA is dominant.  The basilar artery is within normal limits.  Mild narrowing of proximal left P1 segment is stable.  The PCA branch vessels are otherwise within normal limits.  IMPRESSION:  1.  Minimal atherosclerotic changes are most evident in the right cavernous carotid artery proximal left posterior cerebral artery without significant change. 2.  No significant proximal stenosis, aneurysm, or branch vessel occlusion.   Original Report Authenticated By: Marin Roberts, M.D.    CT of the brain    MRI of the brain   IMPRESSION:  1. 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  2. Increased T2 signal within the right optic nerve compatible  with optic neuritis. Infarction is considered as well.  3. Stable atrophy and diffuse white matter disease.   MRA of the brain   IMPRESSION:  1.  Minimal atherosclerotic changes are most evident in the right  cavernous carotid artery proximal left posterior cerebral artery  without significant change.  2. No significant proximal stenosis, aneurysm, or branch vessel  occlusion.  2D Echocardiogram    Carotid Doppler    CXR    EKG   Sinus bradycardia Otherwise normal ECG  Therapy Recommendations No PT need  Physical Exam  General: The patient is alert and cooperative at the  time of the examination.  Skin: No significant peripheral edema is noted.   Neurologic Exam  Cranial nerves: Facial symmetry is present. Speech is normal, no aphasia or dysarthria is noted. Extraocular movements are full. Visual fields are full. The patient reports some blurring of vision however, left greater than right.  Motor: The patient has good strength in all 4 extremities.  Coordination: The patient has good finger-nose-finger and heel-to-shin bilaterally.  Gait and station: The gait was not tested.  Reflexes: Deep tendon reflexes are symmetric.    ASSESSMENT Betty Jackson is a 78 y.o. female presenting with temporal arteritis, punctate infarct seen by MRI in right deep white matter. The patient comes in with elevations in sedimentation rate, and visual changes involving the right eye. Patient is felt to have a flare of her temporal arteritis. The punctate stroke seen by MRI may be a manifestation of the temporal arteritis. This infarct is likely asymptomatic however.   Temporal arteritis  Coronary artery disease  History of paroxysmal atrial  Depression  Gastroesophageal reflux disease  History of migraine  Hospital day # 2  TREATMENT/PLAN  Continue plavix for now  Agree with use of steroids  2-D echocardiogram is pending  The patient could be discharge following the echocardiogram, recommend discharge on 60 mg of prednisone daily, followup with rheumatologist at [facility]  No therapy needs  following discharge  Lesly Dukes [phone] pager 10/30/2012 8:41 AM

## 2012-10-31 DIAGNOSIS — H353190 Nonexudative age-related macular degeneration, unspecified eye, stage unspecified: Secondary | ICD-10-CM | POA: Diagnosis not present

## 2012-10-31 DIAGNOSIS — Z961 Presence of intraocular lens: Secondary | ICD-10-CM | POA: Diagnosis not present

## 2012-10-31 DIAGNOSIS — H2510 Age-related nuclear cataract, unspecified eye: Secondary | ICD-10-CM | POA: Diagnosis not present

## 2012-10-31 DIAGNOSIS — H35039 Hypertensive retinopathy, unspecified eye: Secondary | ICD-10-CM | POA: Diagnosis not present

## 2012-11-01 ENCOUNTER — Telehealth: Payer: Self-pay | Admitting: *Deleted

## 2012-11-01 DIAGNOSIS — H3400 Transient retinal artery occlusion, unspecified eye: Secondary | ICD-10-CM | POA: Diagnosis not present

## 2012-11-01 DIAGNOSIS — M316 Other giant cell arteritis: Secondary | ICD-10-CM | POA: Diagnosis not present

## 2012-11-01 DIAGNOSIS — H4720 Unspecified optic atrophy: Secondary | ICD-10-CM | POA: Diagnosis not present

## 2012-11-01 NOTE — Telephone Encounter (Signed)
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 She's currently on high dose prednisone 60 daily, will need to have ESR checked this week.  Talked with husband. Patient was at opthomologist appointment.  Husband helps with her care.  He states she is improving.  She is having difficulty sleeping, thinking maybe coming from prednisone 60 mg she is taking daily. He also thinks she may need physical therapy to help with ambulation after stroke.  She is compliant with her medication and taking her plavix and prednisone as ordered.  Patient has office visit with dr Caryl Never on august 6 at 1:15pm.

## 2012-11-02 ENCOUNTER — Ambulatory Visit: Payer: Medicare Other | Admitting: Family Medicine

## 2012-11-02 ENCOUNTER — Ambulatory Visit (INDEPENDENT_AMBULATORY_CARE_PROVIDER_SITE_OTHER): Payer: Medicare Other | Admitting: Family Medicine

## 2012-11-02 ENCOUNTER — Encounter: Payer: Self-pay | Admitting: Family Medicine

## 2012-11-02 VITALS — BP 130/78 | HR 72 | Temp 98.2°F | Wt 141.0 lb

## 2012-11-02 DIAGNOSIS — M316 Other giant cell arteritis: Secondary | ICD-10-CM

## 2012-11-02 DIAGNOSIS — I10 Essential (primary) hypertension: Secondary | ICD-10-CM | POA: Diagnosis not present

## 2012-11-02 DIAGNOSIS — I639 Cerebral infarction, unspecified: Secondary | ICD-10-CM

## 2012-11-02 DIAGNOSIS — I6350 Cerebral infarction due to unspecified occlusion or stenosis of unspecified cerebral artery: Secondary | ICD-10-CM

## 2012-11-02 DIAGNOSIS — E876 Hypokalemia: Secondary | ICD-10-CM | POA: Diagnosis not present

## 2012-11-02 LAB — BASIC METABOLIC PANEL
BUN: 17 mg/dL (ref 6–23)
Calcium: 9.5 mg/dL (ref 8.4–10.5)
Creatinine, Ser: 0.8 mg/dL (ref 0.4–1.2)
GFR: 79.78 mL/min (ref >60.00)
Glucose, Bld: 99 mg/dL (ref 70–99)
Potassium: 5.3 mEq/L — ABNORMAL HIGH (ref 3.5–5.1)

## 2012-11-02 LAB — SEDIMENTATION RATE: Sed Rate: 17 mm/hr (ref 0–22)

## 2012-11-02 MED ORDER — PANTOPRAZOLE SODIUM 40 MG PO TBEC: 40.0000 mg | DELAYED_RELEASE_TABLET | Freq: Every day | ORAL | Status: DC

## 2012-11-02 MED ORDER — CLOPIDOGREL BISULFATE 75 MG PO TABS: 75.0000 mg | ORAL_TABLET | Freq: Every day | ORAL | Status: DC

## 2012-11-02 NOTE — Progress Notes (Signed)
Subjective:    Patient ID: Betty Jackson, female    DOB: 1934/08/10, 78 y.o.   MRN: [id number]  HPI Hospital followup. Patient was admitted on 10/28/2012 through 10/30/2012. She has history of temporal arteritis followed by [facility] with previous confirmed biopsy when she was in [place]. On the day of admission she developed some right eye blurred vision and right eye pain. She had elevated sedimentation rate and was admitted with presumed flare of her temporal arteritis. She was given IV Solu-Medrol. She had previous elevated sedimentation rate on 10/24/2012 of 56.  Patient had further evaluation including MRI and MR angiogram. She had small punctate nonhemorrhagic infarct right caudate nucleus which may have been incidental to her symptoms. Carotid Dopplers less than 39% ICA stenosis bilaterally. 2-D echocardiogram unremarkable. LDL 45 with total cholesterol 120. Patient was changed from aspirin to Plavix. She was continued on her regular blood pressure medications. Discharged on prednisone 60 mg daily. She has already seen ophthalmologist since discharge. She has followup with her rheumatologist in [place] tomorrow. Needs repeat sed rate and C-reactive protein today.  Mild hypokalemia during her recent hospital visit with latest K 3.3.  Eye symptoms are stable. Denies any focal weakness. No confusion. She is monitoring blood sugars on high-dose prednisone and stable  Past Medical History  Diagnosis Date  . Hypertension   . Hyperlipidemia   . Incontinence of urine   . CAD (coronary artery disease)     a. Stent RCA in [facility];  b. Cath approx 2009 - nonobs per pt report.  . Sinus bradycardia     a. on chronic bb  . PAF (paroxysmal atrial fibrillation)     a. lone epidode in 2011 according to notes.  . Depression   . Scoliosis   . Vocal cord dysfunction     "they don't operate properly" (10/28/2012)  . Diverticulosis   . Hemorrhoids   . Hiatal hernia   . Enteritis    . GERD (gastroesophageal reflux disease)   . Anxiety     "more so the last few days" (10/28/2012)  . Temporal arteritis 2011    a. followed @ [facility]; potential flareup 10/28/2012/notes 10/28/2012  . Exertional shortness of breath     "every now and then" (10/28/2012)  . Anemia     "one time" (10/28/2012)  . Migraines     "back in my 20's; none since" (10/28/2012)  . Arthritis      Right hemiarthroplasty after hip fracture 06/2011  . DDD (degenerative disc disease)   . Arthritis     "hands" (10/28/2012)  . Chronic lower back pain   . Anxiety state, unspecified 10/28/2012   Past Surgical History  Procedure Laterality Date  . Laparoscopic cholecystectomy  2001  . Tonsillectomy and adenoidectomy    . Abdominal hysterectomy  ~ 1984    TAH  . Bladder suspension    . Hemiarthroplasty hip  06/2011    fractured her hip and repaired in [place]  . Colonoscopy  01/26/2012    Procedure: COLONOSCOPY;  Surgeon: Meryl Dare, MD,FACG;  Location: [facility] ENDOSCOPY;  Service: Endoscopy;  Laterality: N/A;  note the EGD is possible  . Esophagogastroduodenoscopy  01/26/2012    Procedure: ESOPHAGOGASTRODUODENOSCOPY (EGD);  Surgeon: Meryl Dare, MD,FACG;  Location: [facility] ENDOSCOPY;  Service: Endoscopy;  Laterality: N/A;  . Cataract extraction w/ intraocular lens implant Right ~ 08/2012  . Coronary angioplasty with stent placement  2006  . Back surgery    . Temporal  artery biopsy / ligation Bilateral 2011  . Tubal ligation  ~ 1982  . X-stop implantation  ~ 2010    "lower back" (10/28/2012)    reports that she has never smoked. She has never used smokeless tobacco. She reports that she drinks about 0.6 ounces of alcohol per week. She reports that she does not use illicit drugs. family history includes Cancer in her daughter and mother; Heart attack in her father; Heart disease in her other; and Hypertension in her other.  There is no history of Arthritis. Allergies  Allergen Reactions  . Tussionex Pennkinetic Er  (Hydrocod Polst-Cpm Polst Er) Hives      Review of Systems  Constitutional: Negative for fever, chills, appetite change, fatigue and unexpected weight change.  Eyes: Positive for visual disturbance. Negative for photophobia, pain and redness.  Respiratory: Negative for cough and shortness of breath.   Cardiovascular: Negative for chest pain, palpitations and leg swelling.  Gastrointestinal: Negative for nausea, vomiting and abdominal pain.  Endocrine: Negative for polydipsia and polyuria.  Neurological: Negative for dizziness and syncope.  Hematological: Negative for adenopathy.       Objective:   Physical Exam  Constitutional: She is oriented to person, place, and time. She appears well-developed and well-nourished.  HENT:  Mouth/Throat: Oropharynx is clear and moist.  Neck: Neck supple. No thyromegaly present.  Cardiovascular: Normal rate and regular rhythm.   Pulmonary/Chest: Effort normal and breath sounds normal. No respiratory distress. She has no wheezes. She has no rales.  Musculoskeletal: She exhibits no edema.  Neurological: She is alert and oriented to person, place, and time. She has normal reflexes. No cranial nerve deficit.  No focal strength deficits  Psychiatric: She has a normal mood and affect. Her behavior is normal. Judgment and thought content normal.          Assessment & Plan:  #1 temporal arteritis with recent acute recurrent right visual symptoms-and elevating sed rate. On high-dose prednisone. Follow up with rheumatology more. Check sedimentation rate and C-reactive protein.  Continue to monitor blood sugars closely at home. #2 evidence for previous right caudate nucleus nonhemorrhagic infarct. Continue Plavix. Blood pressure stable. Lipids well controlled. Refill Plavix #3 hypertension. Well controlled #4 mild hypokalemia- during hospitalization.  Repeat BMP

## 2012-11-03 DIAGNOSIS — M316 Other giant cell arteritis: Secondary | ICD-10-CM | POA: Diagnosis not present

## 2012-11-03 DIAGNOSIS — M899 Disorder of bone, unspecified: Secondary | ICD-10-CM | POA: Diagnosis not present

## 2012-11-03 DIAGNOSIS — Z79899 Other long term (current) drug therapy: Secondary | ICD-10-CM | POA: Diagnosis not present

## 2012-11-09 ENCOUNTER — Other Ambulatory Visit (INDEPENDENT_AMBULATORY_CARE_PROVIDER_SITE_OTHER): Payer: Medicare Other

## 2012-11-09 DIAGNOSIS — M316 Other giant cell arteritis: Secondary | ICD-10-CM | POA: Diagnosis not present

## 2012-11-09 DIAGNOSIS — M899 Disorder of bone, unspecified: Secondary | ICD-10-CM | POA: Diagnosis not present

## 2012-11-09 DIAGNOSIS — Z79899 Other long term (current) drug therapy: Secondary | ICD-10-CM

## 2012-11-09 DIAGNOSIS — M949 Disorder of cartilage, unspecified: Secondary | ICD-10-CM | POA: Diagnosis not present

## 2012-11-09 LAB — CBC WITH DIFFERENTIAL/PLATELET
Basophils Absolute: 0 10*3/uL (ref 0.0–0.1)
Basophils Relative: 0 % (ref 0.0–3.0)
Eosinophils Absolute: 0 10*3/uL (ref 0.0–0.7)
Lymphocytes Relative: 9.8 % — ABNORMAL LOW (ref 12.0–46.0)
MCHC: 32.9 g/dL (ref 30.0–36.0)
MCV: 87.9 fl (ref 78.0–100.0)
Monocytes Absolute: 0.6 10*3/uL (ref 0.1–1.0)
Neutrophils Relative %: 87 % — ABNORMAL HIGH (ref 43.0–77.0)
Platelets: 230 10*3/uL (ref 150.0–400.0)
RDW: 14 % (ref 11.5–14.6)

## 2012-11-09 LAB — HEPATIC FUNCTION PANEL
ALT: 26 U/L (ref 0–35)
AST: 22 U/L (ref 0–37)
Albumin: 3.9 g/dL (ref 3.5–5.2)
Total Bilirubin: 0.7 mg/dL (ref 0.3–1.2)

## 2012-11-09 LAB — VITAMIN B12: Vitamin B-12: 764 pg/mL (ref 211–911)

## 2012-11-09 LAB — HIGH SENSITIVITY CRP: CRP, High Sensitivity: 0.31 mg/L (ref 0.000–5.000)

## 2012-11-10 LAB — VITAMIN D 25 HYDROXY (VIT D DEFICIENCY, FRACTURES): Vit D, 25-Hydroxy: 59 ng/mL (ref 30–89)

## 2012-11-10 LAB — HEPATITIS B SURFACE ANTIGEN: Hepatitis B Surface Ag: NEGATIVE

## 2012-11-11 LAB — PROTEIN ELECTROPHORESIS, SERUM
Albumin ELP: 57.7 % (ref 55.8–66.1)
Alpha-2-Globulin: 10.7 % (ref 7.1–11.8)
Beta Globulin: 6.1 % (ref 4.7–7.2)
Total Protein, Serum Electrophoresis: 7.1 g/dL (ref 6.0–8.3)

## 2012-11-14 ENCOUNTER — Telehealth: Payer: Self-pay | Admitting: Family Medicine

## 2012-11-14 NOTE — Telephone Encounter (Signed)
PT calling to inquire about lab results from 11/09/12. Please assist.

## 2012-11-14 NOTE — Telephone Encounter (Signed)
Informed patient as soon as i get results i will call her

## 2012-11-15 ENCOUNTER — Encounter: Payer: Self-pay | Admitting: Nurse Practitioner

## 2012-11-15 ENCOUNTER — Other Ambulatory Visit: Payer: Self-pay | Admitting: Family Medicine

## 2012-11-15 ENCOUNTER — Ambulatory Visit (INDEPENDENT_AMBULATORY_CARE_PROVIDER_SITE_OTHER): Payer: Medicare Other | Admitting: Nurse Practitioner

## 2012-11-15 VITALS — BP 127/63 | HR 59 | Ht 64.5 in | Wt 143.0 lb

## 2012-11-15 DIAGNOSIS — M316 Other giant cell arteritis: Secondary | ICD-10-CM

## 2012-11-15 DIAGNOSIS — E119 Type 2 diabetes mellitus without complications: Secondary | ICD-10-CM | POA: Diagnosis not present

## 2012-11-15 DIAGNOSIS — I6350 Cerebral infarction due to unspecified occlusion or stenosis of unspecified cerebral artery: Secondary | ICD-10-CM | POA: Diagnosis not present

## 2012-11-15 DIAGNOSIS — I10 Essential (primary) hypertension: Secondary | ICD-10-CM | POA: Diagnosis not present

## 2012-11-15 DIAGNOSIS — I639 Cerebral infarction, unspecified: Secondary | ICD-10-CM

## 2012-11-15 NOTE — Patient Instructions (Signed)
Continue clopidogrel 75 mg orally every day  for secondary stroke prevention and maintain strict control of hypertension with blood pressure goal below 130/90, diabetes with hemoglobin A1c goal below 6.5% and lipids with LDL cholesterol goal below 100 mg/dL. Followup in the future with me in 6 months.   STROKE/TIA INSTRUCTIONS SMOKING Cigarette smoking nearly doubles your risk of having a stroke & is the single most alterable risk factor  If you smoke or have smoked in the last 12 months, you are advised to quit smoking for your health.  Most of the excess cardiovascular risk related to smoking disappears within a year of stopping.  Ask you doctor about anti-smoking medications  [place] Quit Line: 1-800-QUIT NOW  Free Smoking Cessation Classes ([phone]  CHOLESTEROL Know your levels; limit fat & cholesterol in your diet  Lab Results  Component Value Date   CHOL 120 10/30/2012   HDL 58 10/30/2012   LDLCALC 45 10/30/2012   TRIG 86 10/30/2012   CHOLHDL 2.1 10/30/2012      Many patients benefit from treatment even if their cholesterol is at goal.  Goal: Total Cholesterol less than 160  Goal:  LDL less than 100  Goal:  HDL greater than 40  Goal:  Triglycerides less than 150  BLOOD PRESSURE American Stroke Association blood pressure target is less that 120/80 mm/Hg  Your discharge blood pressure is:  BP: 127/63 mmHg  Monitor your blood pressure  Limit your salt and alcohol intake  Many individuals will require more than one medication for high blood pressure  DIABETES (A1c is a blood sugar average for last 3 months) Goal A1c is under 7% (A1c is blood sugar average for last 3 months)  Diabetes: Diagnosis of diabetes:  A1c was not drawn this admission    Lab Results  Component Value Date   HGBA1C 5.7* 10/30/2012    Your A1c can be lowered with medications, healthy diet, and exercise.  Check your blood sugar as directed by your physician  Call your physician if you experience  unexplained or low blood sugars.  PHYSICAL ACTIVITY/REHABILITATION Goal is 30 minutes at least 4 days per week    Activity decreases your risk of heart attack and stroke and makes your heart stronger.  It helps control your weight and blood pressure; helps you relax and can improve your mood.  Participate in a regular exercise program.  Talk with your doctor about the best form of exercise for you (dancing, walking, swimming, cycling).  DIET/WEIGHT Goal is to maintain a healthy weight  Your height is:  Height: 5' 4.5" (163.8 cm) Your current weight is: Weight: 143 lb (64.864 kg) Your body Mass Index (BMI) is:  BMI (Calculated): 24.2  Following the type of diet specifically designed for you will help prevent another stroke.  Your goal Body Mass Index (BMI) is 19-24.  Healthy food habits can help reduce 3 risk factors for stroke:  High cholesterol, hypertension, and excess weight.

## 2012-11-15 NOTE — Progress Notes (Signed)
[facility]  PATIENT: Betty Jackson DOB: 1934-05-24   HISTORY FROM: patient, chart REASON FOR VISIT: routine follow up  HISTORY OF PRESENT ILLNESS:  Betty Jackson is an 78 y.o. female, right handed, with a past medical history significant for hypertension, hyperlipidemia, CAD s/p stent RCA 2006, paroxysmal atrial fibrillation x 1 episode in 2011, depression, anxiety, GERD, biopsy-proven bilateral temporal arteritis with residual 25% visual loss left eye followed by rheumatologist Dr. Clovis Riley at [facility], comes in today complaining of new onset visual dysfunction involving her right eye.   She has been gradually tapering off prednisone and is was taking 1 mg prednisone daily at the time of this admission.  Betty Jackson expressed that last night she had " an unusual sensation of things not quite right in my right eye" but when awoke this morning there was nothing particularly wrong with her vision. However, around noon time today she was driving and noticed that she was experiencing tunnel vision and her peripheral vision in the right eye was impaired and " kind of fuzzy". She recalls having some pain behind the right eye and perhaps mild eyelid droopiness but denies frank headache, nausea, vomiting, vertigo, double vision, focal weakness or numbness, slurred speech, unsteadiness, confusion, or language impairment.  She had a sed rate of 56 On 10/24/12 and when she presented to [facility] ED she was administered 60 mg IV solumedrol x 1.  Presently, she said that she feels fine but her right eye vision is not 100% back.  Takes aspirin 81 mg daily and atorvastatin 20 mg.  Was discharged on Plavix 75 mg daily.    UPDATE 11/15/12 (LL):  Comes to office for hospital follow up.  Has been taking Prednisone 60 mg daily for treatment of temporal arteritis, follow up with Dr. Hyacinth Meeker at [facility] and Dr. Joana Reamer is her opthamologist.  Her vision is mostly back at baseline and has had no further  neurological problem.  Taking Plavix daily.  Patient denies medication side effects, with no signs of bleeding or excessive bruising.  Taking all other medication as directed.  BP is well- controlled, 127/63 today.  Diabetes is well controlled.  Blood sugars average 110 in the mornings.  REVIEW OF SYSTEMS: Full 14 system review of systems performed and notable only for: constitutional: weight gain  cardiovascular: N/A respiratory: N/A endocrine: N/A  ear/nose/throat: N/A  Eyes: blurred vision, loss of vision Hematology/Lymph: easy bruising musculoskeletal: N/A skin: N/A genitourinary: N/A Gastrointestinal: incontinence allergy/immunology: N/A neurological: N/A sleep: insomnia psychiatric: not enough sleep   ALLERGIES: Allergies  Allergen Reactions  . Tussionex Pennkinetic Er [Hydrocod Polst-Cpm Polst Er] Hives    HOME MEDICATIONS: Outpatient Prescriptions Prior to Visit  Medication Sig Dispense Refill  . ALPRAZolam (XANAX) 0.5 MG tablet Take 1 tablet (0.5 mg total) by mouth at bedtime as needed for sleep.  20 tablet  0  . amLODipine (NORVASC) 5 MG tablet Take 5 mg by mouth daily.      Marland Kitchen atorvastatin (LIPITOR) 20 MG tablet Take 20 mg by mouth daily.      . calcium carbonate (OS-CAL) 600 MG TABS Take 600 mg by mouth daily.      . cholecalciferol (VITAMIN D) 1000 UNITS tablet Take 1,000 Units by mouth 2 (two) times daily.       . clopidogrel (PLAVIX) 75 MG tablet Take 1 tablet (75 mg total) by mouth daily with breakfast.  30 tablet  5  . glucose blood (FREESTYLE LITE) test strip Patient  testing BS once daily. Diag code 446.5  100 each  3  . HYDROcodone-acetaminophen (NORCO/VICODIN) 5-325 MG per tablet Take 1 tablet by mouth every 6 (six) hours as needed for pain.      Marland Kitchen losartan-hydrochlorothiazide (HYZAAR) 50-12.5 MG per tablet Take 1 tablet by mouth daily.  90 tablet  3  . metoprolol tartrate (LOPRESSOR) 25 MG tablet Take 25 mg by mouth 2 (two) times daily.      . Multiple  Vitamins-Minerals (ZINC PO) Take 1 capsule by mouth daily.      . Nutritional Supplements (JUICE PLUS FIBRE PO) Take 1 capsule by mouth daily.      . pantoprazole (PROTONIX) 40 MG tablet Take 1 tablet (40 mg total) by mouth daily.  30 tablet  6  . predniSONE (DELTASONE) 20 MG tablet Take 3 tablets (60 mg total) by mouth daily before breakfast.  90 tablet  3  . temazepam (RESTORIL) 15 MG capsule Take 15 mg by mouth at bedtime as needed for sleep.      . vitamin C (ASCORBIC ACID) 500 MG tablet Take 1,000 mg by mouth daily. For sore throat       No facility-administered medications prior to visit.    PAST MEDICAL HISTORY: Past Medical History  Diagnosis Date  . Hypertension   . Hyperlipidemia   . Incontinence of urine   . CAD (coronary artery disease)     a. Stent RCA in [facility];  b. Cath approx 2009 - nonobs per pt report.  . Sinus bradycardia     a. on chronic bb  . PAF (paroxysmal atrial fibrillation)     a. lone epidode in 2011 according to notes.  . Depression   . Scoliosis   . Vocal cord dysfunction     "they don't operate properly" (10/28/2012)  . Diverticulosis   . Hemorrhoids   . Hiatal hernia   . Enteritis   . GERD (gastroesophageal reflux disease)   . Anxiety     "more so the last few days" (10/28/2012)  . Temporal arteritis 2011    a. followed @ [facility]; potential flareup 10/28/2012/notes 10/28/2012  . Exertional shortness of breath     "every now and then" (10/28/2012)  . Anemia     "one time" (10/28/2012)  . Migraines     "back in my 20's; none since" (10/28/2012)  . Arthritis      Right hemiarthroplasty after hip fracture 06/2011  . DDD (degenerative disc disease)   . Arthritis     "hands" (10/28/2012)  . Chronic lower back pain   . Anxiety state, unspecified 10/28/2012    PAST SURGICAL HISTORY: Past Surgical History  Procedure Laterality Date  . Laparoscopic cholecystectomy  2001  . Tonsillectomy and adenoidectomy    . Abdominal hysterectomy  ~ 1984    TAH  .  Bladder suspension    . Hemiarthroplasty hip  06/2011    fractured her hip and repaired in [place], [place]  . Colonoscopy  01/26/2012    Procedure: COLONOSCOPY;  Surgeon: Meryl Dare, MD,FACG;  Location: [facility] ENDOSCOPY;  Service: Endoscopy;  Laterality: N/A;  note the EGD is possible  . Esophagogastroduodenoscopy  01/26/2012    Procedure: ESOPHAGOGASTRODUODENOSCOPY (EGD);  Surgeon: Meryl Dare, MD,FACG;  Location: [facility] ENDOSCOPY;  Service: Endoscopy;  Laterality: N/A;  . Cataract extraction w/ intraocular lens implant Right ~ 08/2012  . Coronary angioplasty with stent placement  2006  . Back surgery    . Temporal artery biopsy /  ligation Bilateral 2011  . Tubal ligation  ~ 1982  . X-stop implantation  ~ 2010    "lower back" (10/28/2012)    FAMILY HISTORY: Family History  Problem Relation Age of Onset  . Cancer Daughter     breast  . Arthritis Neg Hx   . Heart disease Other   . Hypertension Other   . Heart attack Father   . Cancer Mother     SOCIAL HISTORY: History   Social History  . Marital Status: Married    Spouse Name: N/A    Number of Children: 3  . Years of Education: N/A   Occupational History  . Retired Veterinary surgeon    Social History Main Topics  . Smoking status: Never Smoker   . Smokeless tobacco: Never Used  . Alcohol Use: 0.6 oz/week    1 Glasses of wine per week     Comment: 10/28/2012 "maybe glass of wine q week"  . Drug Use: No  . Sexual Activity: Not Currently    Birth Control/ Protection: Surgical   Other Topics Concern  . Not on file   Social History Narrative   Lives in [place] with her husband.  Retired Veterinary surgeon.     PHYSICAL EXAM  Filed Vitals:   11/15/12 1354  BP: 127/63  Pulse: 59  Height: 5' 4.5" (1.638 m)  Weight: 143 lb (64.864 kg)   Body mass index is 24.18 kg/(m^2).  Generalized: In no acute distress, well developed, well groomed Caucasian female  Neck: Supple, no carotid bruits   Cardiac: Regular rate rhythm, no murmur    Pulmonary: Clear to auscultation bilaterally   Musculoskeletal: No deformity   Neurological examination   Mentation: Alert oriented to time, place, history taking, language fluent, and casual conversation  Cranial nerve II-XII: Pupils were equal round reactive to light extraocular movements were full, visual field were full on confrontational test. facial sensation and strength were normal. hearing was intact to finger rubbing bilaterally. Uvula tongue midline. head turning and shoulder shrug and were normal and symmetric.Tongue protrusion into cheek strength was normal. MOTOR: normal bulk and tone, full strength in the BUE, BLE, fine finger movements normal, no pronator drift SENSORY: normal and symmetric to light touch, pinprick, temperature, vibration and proprioception COORDINATION: finger-nose-finger, heel-to-shin bilaterally, there was no truncal ataxia REFLEXES: Brachioradialis 2/2, biceps 2/2, triceps 2/2, patellar 2/2, Achilles 2/2, plantar responses were flexor bilaterally. GAIT/STATION: Rising up from seated position without assistance, normal stance, without trunk ataxia, moderate stride, good arm swing, smooth turning, able to perform tiptoe, and heel walking without difficulty.    DIAGNOSTIC DATA (LABS, IMAGING, TESTING) - I reviewed patient records, labs, notes, testing and imaging myself where available.  Lab Results  Component Value Date   WBC 17.9* 11/09/2012   HGB 12.0 11/09/2012   HCT 36.5 11/09/2012   MCV 87.9 11/09/2012   PLT 230.0 11/09/2012      Component Value Date/Time   NA 130* 11/02/2012 1406   K 5.3* 11/02/2012 1406   CL 94* 11/02/2012 1406   CO2 28 11/02/2012 1406   GLUCOSE 99 11/02/2012 1406   BUN 17 11/02/2012 1406   CREATININE 0.7 11/09/2012 1017   CALCIUM 9.5 11/02/2012 1406   PROT 6.9 11/09/2012 1017   ALBUMIN 3.9 11/09/2012 1017   AST 22 11/09/2012 1017   ALT 26 11/09/2012 1017   ALKPHOS 87 11/09/2012 1017   BILITOT 0.7 11/09/2012 1017   GFRNONAA 85* 10/30/2012  0510   GFRAA >90 10/30/2012 0510  Lab Results  Component Value Date   CHOL 120 10/30/2012   HDL 58 10/30/2012   LDLCALC 45 10/30/2012   TRIG 86 10/30/2012   CHOLHDL 2.1 10/30/2012   Lab Results  Component Value Date   HGBA1C 5.7* 10/30/2012   Lab Results  Component Value Date   VITAMINB12 764 11/09/2012   Lab Results  Component Value Date   TSH 1.257 01/24/2012    MRI of the brain 10/29/12 Punctate non hemorrhagic infarct in the right caudate nucleus. Increased T2 signal within the right optic nerve compatible with optic neuritis. Infarction is considered as well.   Stable atrophy and diffuse white matter disease.  MRA of the brain 10/29/12 Minimal atherosclerotic changes are most evident in the right cavernous carotid artery proximal left posterior cerebral artery without significant change. No significant proximal stenosis, aneurysm, or branch vessel occlusion. 2D Echo 10/30/12 2-D echo showed EF of 65-70%, normal wall motion, no regional wall motion abnormalities.  ASSESSMENT AND PLAN Ms. Mikhayla Phillis is a 78 y.o. female presenting with temporal arteritis, punctate infarct seen by MRI in right deep white matter. The patient had elevation in sedimentation rate, and visual changes involving the right eye. Patient is felt to have a flare of her temporal arteritis. Question punctate stroke seen on MRI. This infarct is asymptomatic however. Vascular risk factors of hypertension, diabetes, CAD, history of atrial fibrillation, and hyperlipidemia.  Continue clopidogrel 75 mg orally every day  for secondary stroke prevention and maintain strict control of hypertension with blood pressure goal below 130/90, diabetes with hemoglobin A1c goal below 6.5% and lipids with LDL cholesterol goal below 100 mg/dL.  Continue to follow up with Dr. Hyacinth Meeker and opthamologist for temporal arteritis. May return to [place] as needed in the future.   Cleopha Indelicato NP-C 11/15/2012, 2:16 PM  [facility] [address], [address] [place], [place] [address] [phone]

## 2012-11-16 ENCOUNTER — Other Ambulatory Visit (INDEPENDENT_AMBULATORY_CARE_PROVIDER_SITE_OTHER): Payer: Medicare Other

## 2012-11-16 ENCOUNTER — Telehealth: Payer: Self-pay

## 2012-11-16 DIAGNOSIS — M316 Other giant cell arteritis: Secondary | ICD-10-CM

## 2012-11-16 DIAGNOSIS — M899 Disorder of bone, unspecified: Secondary | ICD-10-CM | POA: Diagnosis not present

## 2012-11-16 DIAGNOSIS — E876 Hypokalemia: Secondary | ICD-10-CM | POA: Diagnosis not present

## 2012-11-16 LAB — BASIC METABOLIC PANEL
CO2: 28 mEq/L (ref 19–32)
Calcium: 9 mg/dL (ref 8.4–10.5)
Creatinine, Ser: 0.9 mg/dL (ref 0.4–1.2)
Sodium: 133 mEq/L — ABNORMAL LOW (ref 135–145)

## 2012-11-16 LAB — HIGH SENSITIVITY CRP: CRP, High Sensitivity: <0.5 mg/L (ref 0.000–5.000)

## 2012-11-16 LAB — SEDIMENTATION RATE: Sed Rate: 7 mm/hr (ref 0–22)

## 2012-11-16 NOTE — Telephone Encounter (Signed)
Refill OK

## 2012-11-16 NOTE — Telephone Encounter (Signed)
Left message on VM per Dr. Caryl Never

## 2012-11-16 NOTE — Telephone Encounter (Signed)
Frequency should be dictated by her rheumatologist in [place]- who is managing her temporal arteritis.  They had been getting monthly for her sed rate and CRP.

## 2012-11-16 NOTE — Telephone Encounter (Signed)
Pt came in the office today for labs and wants to know do you want her to come back for labs in a month for a recheck of all of labs that she had about a week ago.

## 2012-11-17 DIAGNOSIS — M316 Other giant cell arteritis: Secondary | ICD-10-CM | POA: Diagnosis not present

## 2012-11-17 DIAGNOSIS — Z79899 Other long term (current) drug therapy: Secondary | ICD-10-CM | POA: Diagnosis not present

## 2012-11-23 ENCOUNTER — Other Ambulatory Visit: Payer: Medicare Other

## 2012-11-24 ENCOUNTER — Other Ambulatory Visit (INDEPENDENT_AMBULATORY_CARE_PROVIDER_SITE_OTHER): Payer: Medicare Other

## 2012-11-24 DIAGNOSIS — M899 Disorder of bone, unspecified: Secondary | ICD-10-CM | POA: Diagnosis not present

## 2012-11-24 DIAGNOSIS — M316 Other giant cell arteritis: Secondary | ICD-10-CM | POA: Diagnosis not present

## 2012-11-24 DIAGNOSIS — H469 Unspecified optic neuritis: Secondary | ICD-10-CM | POA: Diagnosis not present

## 2012-11-24 DIAGNOSIS — Z79899 Other long term (current) drug therapy: Secondary | ICD-10-CM | POA: Diagnosis not present

## 2012-11-24 LAB — CBC WITH DIFFERENTIAL/PLATELET
Basophils Absolute: 0 10*3/uL (ref 0.0–0.1)
Eosinophils Absolute: 0 10*3/uL (ref 0.0–0.7)
Lymphocytes Relative: 19.1 % (ref 12.0–46.0)
MCHC: 33.5 g/dL (ref 30.0–36.0)
Monocytes Relative: 5.7 % (ref 3.0–12.0)
Neutrophils Relative %: 75 % (ref 43.0–77.0)
Platelets: 184 10*3/uL (ref 150.0–400.0)
RDW: 14.8 % — ABNORMAL HIGH (ref 11.5–14.6)

## 2012-11-24 LAB — SEDIMENTATION RATE: Sed Rate: 7 mm/hr (ref 0–22)

## 2012-11-24 LAB — HEMOGLOBIN A1C: Hgb A1c MFr Bld: 5.8 % (ref 4.6–6.5)

## 2012-11-25 LAB — HIGH SENSITIVITY CRP: CRP, High Sensitivity: 0.26 mg/L (ref 0.000–5.000)

## 2012-11-25 LAB — ANCA SCREEN W REFLEX TITER: c-ANCA Screen: NEGATIVE

## 2012-11-25 LAB — ANTI-NUCLEAR AB-TITER (ANA TITER): ANA Titer 1: NEGATIVE

## 2012-11-25 LAB — ANA: Anti Nuclear Antibody(ANA): POSITIVE — AB

## 2012-11-25 LAB — RHEUMATOID FACTOR: Rhuematoid fact SerPl-aCnc: <10 IU/mL (ref <=14)

## 2012-11-29 LAB — IMMUNOFIXATION ELECTROPHORESIS
IgM, Serum: 273 mg/dL (ref 52–322)
Total Protein, Serum Electrophoresis: 6.2 g/dL (ref 6.0–8.3)

## 2012-11-30 ENCOUNTER — Other Ambulatory Visit (INDEPENDENT_AMBULATORY_CARE_PROVIDER_SITE_OTHER): Payer: Medicare Other

## 2012-11-30 ENCOUNTER — Telehealth: Payer: Self-pay | Admitting: Cardiovascular Disease

## 2012-11-30 DIAGNOSIS — M316 Other giant cell arteritis: Secondary | ICD-10-CM | POA: Diagnosis not present

## 2012-11-30 DIAGNOSIS — H469 Unspecified optic neuritis: Secondary | ICD-10-CM

## 2012-11-30 DIAGNOSIS — Z79899 Other long term (current) drug therapy: Secondary | ICD-10-CM

## 2012-11-30 LAB — SEDIMENTATION RATE: Sed Rate: 5 mm/h (ref 0–22)

## 2012-11-30 NOTE — Telephone Encounter (Signed)
Spoke with pt's daughter after getting permission from pt to speak with her. Daughter reports pt complains of shortness of breath with any exertion. Especially notices when climbing stairs. Appt made for pt to see Tereso Newcomer, PA on December 01, 2012 at 10:10.

## 2012-11-30 NOTE — Telephone Encounter (Signed)
New problem    Offer appt with Scott on 9/4 however, Daughter wants appt move up from  10/3.    Recently in the hospital at [facility]. Patient stays with daughter walks up the stairs here her sob . Not feeling well at all. Per  Daughter base on her history  she might need a stent or cath. Blood pressure  This am bottom number was high than it should be.

## 2012-12-01 ENCOUNTER — Encounter: Payer: Self-pay | Admitting: *Deleted

## 2012-12-01 ENCOUNTER — Ambulatory Visit (INDEPENDENT_AMBULATORY_CARE_PROVIDER_SITE_OTHER): Payer: Medicare Other | Admitting: Physician Assistant

## 2012-12-01 ENCOUNTER — Encounter: Payer: Self-pay | Admitting: Family Medicine

## 2012-12-01 ENCOUNTER — Encounter: Payer: Self-pay | Admitting: Physician Assistant

## 2012-12-01 VITALS — BP 140/82 | HR 57 | Ht 64.5 in | Wt 144.0 lb

## 2012-12-01 DIAGNOSIS — I10 Essential (primary) hypertension: Secondary | ICD-10-CM | POA: Diagnosis not present

## 2012-12-01 DIAGNOSIS — R0602 Shortness of breath: Secondary | ICD-10-CM | POA: Diagnosis not present

## 2012-12-01 DIAGNOSIS — I2510 Atherosclerotic heart disease of native coronary artery without angina pectoris: Secondary | ICD-10-CM

## 2012-12-01 DIAGNOSIS — E785 Hyperlipidemia, unspecified: Secondary | ICD-10-CM | POA: Diagnosis not present

## 2012-12-01 DIAGNOSIS — M316 Other giant cell arteritis: Secondary | ICD-10-CM

## 2012-12-01 LAB — BASIC METABOLIC PANEL
BUN: 21 mg/dL (ref 6–23)
Creatinine, Ser: 0.8 mg/dL (ref 0.4–1.2)
GFR: 72.99 mL/min (ref >60.00)
Glucose, Bld: 99 mg/dL (ref 70–99)
Potassium: 4.3 mEq/L (ref 3.5–5.1)

## 2012-12-01 LAB — CBC WITH DIFFERENTIAL/PLATELET
Basophils Absolute: 0 10*3/uL (ref 0.0–0.1)
HCT: 41.3 % (ref 36.0–46.0)
Lymphs Abs: 1.5 10*3/uL (ref 0.7–4.0)
Monocytes Absolute: 0.4 10*3/uL (ref 0.1–1.0)
Monocytes Relative: 2.9 % — ABNORMAL LOW (ref 3.0–12.0)
Neutrophils Relative %: 85.9 % — ABNORMAL HIGH (ref 43.0–77.0)
Platelets: 234 10*3/uL (ref 150.0–400.0)
RDW: 15.3 % — ABNORMAL HIGH (ref 11.5–14.6)

## 2012-12-01 LAB — BRAIN NATRIURETIC PEPTIDE: Pro B Natriuretic peptide (BNP): 62 pg/mL (ref 0.0–100.0)

## 2012-12-01 NOTE — H&P (Signed)
History and Physical  Date:  12/01/2012   ID:  Betty Jackson, DOB 06-24-1934, MRN [id number]  PCP:  Kristian Covey, MD  Cardiologist:  Dr. Verne Carrow     History of Present Illness: Betty Jackson is a 78 y.o. female who returns for the evaluation of dyspnea.   She has a history of CAD, status post PCI to the RCA in [place] in 2005, HTN, HL, GERD and temporal arteritis. Repeat cardiac catheterization in [place] in 2009 demonstrated patent stent in no severe disease elsewhere. She history did at [facility] for her temporal arteritis. She had one isolated episode of atrial fibrillation in 2011. She established with Dr. Verne Carrow in 01/2011.  ABIs 12/12: Normal.  Patient was recently admitted 8/1-8/3 with a suspected acute CVA (punctate infarct and right caudate nucleus). She was placed on Plavix per neurology recommendations. Carotid US 8/14: Bilateral less than 39% ICA stenosis. Echocardiogram 10/30/12: EF 65-70%, normal wall motion, PASP 34.  Her ESR was elevated and visual changes involved the right eye. It was felt the patient had a flare of her temporal arteritis and a punctate stroke seen on MRI may be a manifestation of the temporal arteritis. The infarct was felt to be asymptomatic. She was also placed on steroids.  Over the last few weeks, she notes extreme exhaustion, decreased exercise tolerance and DOE.  She notes NYHA Class IIb-III symptoms.  She has has occasional chest pain for years.  There has been no change.  No palps, syncope, near syncope, orthopnea, PND, edema.  She will get dizzy sometimes and lightheaded.  Her balance may be off from worsening visual field loss from the temporal arteritis.  She notes that she has not felt DOE like this since prior to her PCI.    Labs (8/14):  K 4, Cr 0.9, ALT 26, LDL 45, Hgb 12  Wt Readings from Last 3 Encounters:  12/01/12 144 lb (65.318 kg)  11/15/12 143 lb (64.864 kg)  11/02/12 141 lb (63.957 kg)     Past Medical History   Diagnosis Date  . Hypertension   . Hyperlipidemia   . Incontinence of urine   . CAD (coronary artery disease)     a. Stent RCA in [facility];  b. Cath approx 2009 - nonobs per pt report.  . Sinus bradycardia     a. on chronic bb  . PAF (paroxysmal atrial fibrillation)     a. lone epidode in 2011 according to notes.  . Depression   . Scoliosis   . Vocal cord dysfunction     "they don't operate properly" (10/28/2012)  . Diverticulosis   . Hemorrhoids   . Hiatal hernia   . Enteritis   . GERD (gastroesophageal reflux disease)   . Anxiety     "more so the last few days" (10/28/2012)  . Temporal arteritis 2011    a. followed @ [facility]; potential flareup 10/28/2012/notes 10/28/2012  . Exertional shortness of breath     "every now and then" (10/28/2012)  . Anemia     "one time" (10/28/2012)  . Migraines     "back in my 20's; none since" (10/28/2012)  . Arthritis      Right hemiarthroplasty after hip fracture 06/2011  . DDD (degenerative disc disease)   . Arthritis     "hands" (10/28/2012)  . Chronic lower back pain   . Anxiety state, unspecified 10/28/2012    Current Outpatient Prescriptions  Medication Sig Dispense Refill  . ALPRAZolam (XANAX) 0.5 MG  tablet Take 1 tablet (0.5 mg total) by mouth at bedtime as needed for sleep.  20 tablet  0  . amLODipine (NORVASC) 5 MG tablet Take 5 mg by mouth daily.      Marland Kitchen atorvastatin (LIPITOR) 20 MG tablet Take 20 mg by mouth daily.      . calcium carbonate (OS-CAL) 600 MG TABS Take 600 mg by mouth daily.      . cholecalciferol (VITAMIN D) 1000 UNITS tablet Take 1,000 Units by mouth 2 (two) times daily.       . clopidogrel (PLAVIX) 75 MG tablet Take 1 tablet (75 mg total) by mouth daily with breakfast.  30 tablet  5  . glucose blood (FREESTYLE LITE) test strip Patient testing BS once daily. Diag code 446.5  100 each  3  . HYDROcodone-acetaminophen (NORCO/VICODIN) 5-325 MG per tablet Take 1 tablet by mouth every 6 (six) hours as needed for pain.      Marland Kitchen  losartan-hydrochlorothiazide (HYZAAR) 50-12.5 MG per tablet Take 1 tablet by mouth daily.  90 tablet  3  . metoprolol tartrate (LOPRESSOR) 25 MG tablet Take 25 mg by mouth 2 (two) times daily.      . Multiple Vitamins-Minerals (ZINC PO) Take 1 capsule by mouth daily.      . Nutritional Supplements (JUICE PLUS FIBRE PO) Take 1 capsule by mouth daily.      . pantoprazole (PROTONIX) 40 MG tablet Take 1 tablet (40 mg total) by mouth daily.  30 tablet  6  . predniSONE (DELTASONE) 20 MG tablet Take 50 mg by mouth daily before breakfast.      . temazepam (RESTORIL) 15 MG capsule TAKE 2 CAPSULES BY MOUTH AT BEDTIME AS NEEDED FOR SLEEP  60 capsule  0  . vitamin C (ASCORBIC ACID) 500 MG tablet Take 1,000 mg by mouth daily. For sore throat       No current facility-administered medications for this visit.    Allergies:    Allergies  Allergen Reactions  . Tussionex Pennkinetic Er [Hydrocod Polst-Cpm Polst Er] Hives    Social History:  The patient  reports that she has never smoked. She has never used smokeless tobacco. She reports that she drinks about 0.6 ounces of alcohol per week. She reports that she does not use illicit drugs.   Family History:  The patient's family history includes Cancer in her daughter and mother; Heart attack in her father; Heart disease in her other; Hypertension in her other. There is no history of Arthritis.   ROS:  Please see the history of present illness.   She has  a chronic cough from a long hx of vocal cord dysfunction.   All other systems reviewed and negative.   PHYSICAL EXAM: VS:  BP 140/82  Pulse 57  Ht 5' 4.5" (1.638 m)  Wt 144 lb (65.318 kg)  BMI 24.34 kg/m2 Well nourished, well developed, in no acute distress HEENT: normal Neck: no JVD Vascular:  No carotid bruits Cardiac:  normal S1, S2; RRR; no murmur Lungs:  clear to auscultation bilaterally, no wheezing, rhonchi or rales Abd: soft, nontender, no hepatomegaly Ext: no edema Skin: warm and  dry Neuro:  CNs 2-12 intact, no focal abnormalities noted  EKG:  Sinus brady, HR 57, no acute changes     ASSESSMENT AND PLAN:  1. Dyspnea:  This seems to be her anginal equivalent.  She had an extensive workup including a nuclear stress test that was normal prior to proceeding with her cath  in 2005.  She has symptoms with just minimal activity (CCS Class III).  She is already on CCB and beta blocker Rx.  We discussed adjusting her medical Rx and proceeding with stress testing vs proceeding to cardiac cath.  I prefer cardiac cath.  I d/w Dr. Verne Carrow who agreed.  We will arrange a right and left heart cath. Risks and benefits of cardiac catheterization have been discussed with the patient.  These include bleeding, infection, kidney damage, stroke, heart attack, death.  The patient understands these risks and is willing to proceed. I will check a BNP with her labs today.   2. CAD:  Proceed with cardiac cath as noted. Continue Plavix, statin. 3. Temporal Arteritis:  Continue prednisone and follow up with rheumatology. 4. CVA:  In reviewing her chart, it is still not clear if she had a recent stroke.  Findings may all be explained by her temporal arteritis.  She is now on Plavix instead of ASA. 5. Hypertension:  Controlled.  Continue current therapy.  6. Hyperlipidemia:  Continue statin.   7. Disposition:  Proceed with cath next week with Dr. Verne Carrow.   Signed, Tereso Newcomer, PA-C  12/01/2012 11:02 AM    I agree with the assessment and plan as outlined above by Tereso Newcomer, PA-C.   MCALHANY,CHRISTOPHER 3:58 PM 12/02/2012

## 2012-12-01 NOTE — Patient Instructions (Addendum)
Your physician has requested that you have a cardiac catheterization. THIS IS A RIGHT AND LEFT HEART CATH 12/09/12 @ 9:30 AM WITH DR. Clifton James DX DOE, CAD. Cardiac catheterization is used to diagnose and/or treat various heart conditions. Doctors may recommend this procedure for a number of different reasons. The most common reason is to evaluate chest pain. Chest pain can be a symptom of coronary artery disease (CAD), and cardiac catheterization can show whether plaque is narrowing or blocking your heart's arteries. This procedure is also used to evaluate the valves, as well as measure the blood flow and oxygen levels in different parts of your heart. For further information please visit [email or web address] Please follow instruction sheet, as given.  LABS TODAY; BMET, CBC W/DIFF, BNP  MAKE SURE TO KEEP YOUR FOLLOW UP APPT WITH DR. Clifton James 12/30/12  NO MEDICATION CHANGES WERE MADE TODAY

## 2012-12-01 NOTE — Progress Notes (Signed)
[address] [place], [place]  [address] Phone: [phone] Fax:  [phone]  Date:  12/01/2012   ID:  Betty Jackson, DOB September 28, 1934, MRN [id number]  PCP:  Kristian Covey, MD  Cardiologist:  Dr. Verne Carrow     History of Present Illness: Betty Jackson is a 78 y.o. female who returns for the evaluation of dyspnea.   She has a history of CAD, status post PCI to the RCA in [place] in 2005, HTN, HL, GERD and temporal arteritis. Repeat cardiac catheterization in [place] in 2009 demonstrated patent stent in no severe disease elsewhere. She history did at [facility] for her temporal arteritis. She had one isolated episode of atrial fibrillation in 2011. She established with Dr. Verne Carrow in 01/2011.  ABIs 12/12: Normal.  Patient was recently admitted 8/1-8/3 with a suspected acute CVA (punctate infarct and right caudate nucleus). She was placed on Plavix per neurology recommendations. Carotid US 8/14: Bilateral less than 39% ICA stenosis. Echocardiogram 10/30/12: EF 65-70%, normal wall motion, PASP 34.  Her ESR was elevated and visual changes involved the right eye. It was felt the patient had a flare of her temporal arteritis and a punctate stroke seen on MRI may be a manifestation of the temporal arteritis. The infarct was felt to be asymptomatic. She was also placed on steroids.  Over the last few weeks, she notes extreme exhaustion, decreased exercise tolerance and DOE.  She notes NYHA Class IIb-III symptoms.  She has has occasional chest pain for years.  There has been no change.  No palps, syncope, near syncope, orthopnea, PND, edema.  She will get dizzy sometimes and lightheaded.  Her balance may be off from worsening visual field loss from the temporal arteritis.  She notes that she has not felt DOE like this since prior to her PCI.    Labs (8/14):  K 4, Cr 0.9, ALT 26, LDL 45, Hgb 12  Wt Readings from Last 3 Encounters:  12/01/12 144 lb (65.318 kg)    11/15/12 143 lb (64.864 kg)  11/02/12 141 lb (63.957 kg)     Past Medical History  Diagnosis Date  . Hypertension   . Hyperlipidemia   . Incontinence of urine   . CAD (coronary artery disease)     a. Stent RCA in [facility];  b. Cath approx 2009 - nonobs per pt report.  . Sinus bradycardia     a. on chronic bb  . PAF (paroxysmal atrial fibrillation)     a. lone epidode in 2011 according to notes.  . Depression   . Scoliosis   . Vocal cord dysfunction     "they don't operate properly" (10/28/2012)  . Diverticulosis   . Hemorrhoids   . Hiatal hernia   . Enteritis   . GERD (gastroesophageal reflux disease)   . Anxiety     "more so the last few days" (10/28/2012)  . Temporal arteritis 2011    a. followed @ [facility]; potential flareup 10/28/2012/notes 10/28/2012  . Exertional shortness of breath     "every now and then" (10/28/2012)  . Anemia     "one time" (10/28/2012)  . Migraines     "back in my 20's; none since" (10/28/2012)  . Arthritis      Right hemiarthroplasty after hip fracture 06/2011  . DDD (degenerative disc disease)   . Arthritis     "hands" (10/28/2012)  . Chronic lower back pain   . Anxiety state, unspecified 10/28/2012  Current Outpatient Prescriptions  Medication Sig Dispense Refill  . ALPRAZolam (XANAX) 0.5 MG tablet Take 1 tablet (0.5 mg total) by mouth at bedtime as needed for sleep.  20 tablet  0  . amLODipine (NORVASC) 5 MG tablet Take 5 mg by mouth daily.      Marland Kitchen atorvastatin (LIPITOR) 20 MG tablet Take 20 mg by mouth daily.      . calcium carbonate (OS-CAL) 600 MG TABS Take 600 mg by mouth daily.      . cholecalciferol (VITAMIN D) 1000 UNITS tablet Take 1,000 Units by mouth 2 (two) times daily.       . clopidogrel (PLAVIX) 75 MG tablet Take 1 tablet (75 mg total) by mouth daily with breakfast.  30 tablet  5  . glucose blood (FREESTYLE LITE) test strip Patient testing BS once daily. Diag code 446.5  100 each  3  . HYDROcodone-acetaminophen (NORCO/VICODIN)  5-325 MG per tablet Take 1 tablet by mouth every 6 (six) hours as needed for pain.      Marland Kitchen losartan-hydrochlorothiazide (HYZAAR) 50-12.5 MG per tablet Take 1 tablet by mouth daily.  90 tablet  3  . metoprolol tartrate (LOPRESSOR) 25 MG tablet Take 25 mg by mouth 2 (two) times daily.      . Multiple Vitamins-Minerals (ZINC PO) Take 1 capsule by mouth daily.      . Nutritional Supplements (JUICE PLUS FIBRE PO) Take 1 capsule by mouth daily.      . pantoprazole (PROTONIX) 40 MG tablet Take 1 tablet (40 mg total) by mouth daily.  30 tablet  6  . predniSONE (DELTASONE) 20 MG tablet Take 50 mg by mouth daily before breakfast.      . temazepam (RESTORIL) 15 MG capsule TAKE 2 CAPSULES BY MOUTH AT BEDTIME AS NEEDED FOR SLEEP  60 capsule  0  . vitamin C (ASCORBIC ACID) 500 MG tablet Take 1,000 mg by mouth daily. For sore throat       No current facility-administered medications for this visit.    Allergies:    Allergies  Allergen Reactions  . Tussionex Pennkinetic Er [Hydrocod Polst-Cpm Polst Er] Hives    Social History:  The patient  reports that she has never smoked. She has never used smokeless tobacco. She reports that she drinks about 0.6 ounces of alcohol per week. She reports that she does not use illicit drugs.   Family History:  The patient's family history includes Cancer in her daughter and mother; Heart attack in her father; Heart disease in her other; Hypertension in her other. There is no history of Arthritis.   ROS:  Please see the history of present illness.   She has  a chronic cough from a long hx of vocal cord dysfunction.   All other systems reviewed and negative.   PHYSICAL EXAM: VS:  BP 140/82  Pulse 57  Ht 5' 4.5" (1.638 m)  Wt 144 lb (65.318 kg)  BMI 24.34 kg/m2 Well nourished, well developed, in no acute distress HEENT: normal Neck: no JVD Vascular:  No carotid bruits Cardiac:  normal S1, S2; RRR; no murmur Lungs:  clear to auscultation bilaterally, no wheezing,  rhonchi or rales Abd: soft, nontender, no hepatomegaly Ext: no edema Skin: warm and dry Neuro:  CNs 2-12 intact, no focal abnormalities noted  EKG:  Sinus brady, HR 57, no acute changes     ASSESSMENT AND PLAN:  1. Dyspnea:  This seems to be her anginal equivalent.  She had an extensive workup  including a nuclear stress test that was normal prior to proceeding with her cath in 2005.  She has symptoms with just minimal activity (CCS Class III).  She is already on CCB and beta blocker Rx.  We discussed adjusting her medical Rx and proceeding with stress testing vs proceeding to cardiac cath.  I prefer cardiac cath.  I d/w Dr. Verne Carrow who agreed.  We will arrange a right and left heart cath. Risks and benefits of cardiac catheterization have been discussed with the patient.  These include bleeding, infection, kidney damage, stroke, heart attack, death.  The patient understands these risks and is willing to proceed. I will check a BNP with her labs today.   2. CAD:  Proceed with cardiac cath as noted. Continue Plavix, statin. 3. Temporal Arteritis:  Continue prednisone and follow up with rheumatology. 4. CVA:  In reviewing her chart, it is still not clear if she had a recent stroke.  Findings may all be explained by her temporal arteritis.  She is now on Plavix instead of ASA. 5. Hypertension:  Controlled.  Continue current therapy.  6. Hyperlipidemia:  Continue statin.   7. Disposition:  Proceed with cath next week with Dr. Verne Carrow.   Signed, Tereso Newcomer, PA-C  12/01/2012 11:02 AM

## 2012-12-02 ENCOUNTER — Telehealth: Payer: Self-pay | Admitting: *Deleted

## 2012-12-02 NOTE — Addendum Note (Signed)
Addended byAlben Spittle, Lorin Picket T on: 12/02/2012 08:06 AM   Modules accepted: Orders

## 2012-12-02 NOTE — Telephone Encounter (Signed)
pt notified about lab results with verbal understanding to all results, ok for cath

## 2012-12-05 DIAGNOSIS — M810 Age-related osteoporosis without current pathological fracture: Secondary | ICD-10-CM | POA: Diagnosis not present

## 2012-12-05 DIAGNOSIS — IMO0002 Reserved for concepts with insufficient information to code with codable children: Secondary | ICD-10-CM | POA: Diagnosis not present

## 2012-12-06 DIAGNOSIS — M4150 Other secondary scoliosis, site unspecified: Secondary | ICD-10-CM | POA: Diagnosis not present

## 2012-12-06 DIAGNOSIS — M5137 Other intervertebral disc degeneration, lumbosacral region: Secondary | ICD-10-CM | POA: Diagnosis not present

## 2012-12-07 ENCOUNTER — Other Ambulatory Visit (INDEPENDENT_AMBULATORY_CARE_PROVIDER_SITE_OTHER): Payer: Medicare Other

## 2012-12-07 DIAGNOSIS — K5150 Left sided colitis without complications: Secondary | ICD-10-CM

## 2012-12-07 DIAGNOSIS — Z79899 Other long term (current) drug therapy: Secondary | ICD-10-CM | POA: Diagnosis not present

## 2012-12-07 LAB — HIGH SENSITIVITY CRP: CRP, High Sensitivity: 0.59 mg/L (ref 0.000–5.000)

## 2012-12-08 ENCOUNTER — Encounter: Payer: Self-pay | Admitting: Family Medicine

## 2012-12-08 ENCOUNTER — Other Ambulatory Visit: Payer: Self-pay | Admitting: Orthopaedic Surgery

## 2012-12-08 DIAGNOSIS — M316 Other giant cell arteritis: Secondary | ICD-10-CM | POA: Diagnosis not present

## 2012-12-08 DIAGNOSIS — H35039 Hypertensive retinopathy, unspecified eye: Secondary | ICD-10-CM | POA: Diagnosis not present

## 2012-12-08 DIAGNOSIS — H469 Unspecified optic neuritis: Secondary | ICD-10-CM | POA: Diagnosis not present

## 2012-12-08 DIAGNOSIS — H353190 Nonexudative age-related macular degeneration, unspecified eye, stage unspecified: Secondary | ICD-10-CM | POA: Diagnosis not present

## 2012-12-08 DIAGNOSIS — M5137 Other intervertebral disc degeneration, lumbosacral region: Secondary | ICD-10-CM

## 2012-12-09 ENCOUNTER — Encounter (HOSPITAL_BASED_OUTPATIENT_CLINIC_OR_DEPARTMENT_OTHER)
Admission: RE | Disposition: A | Discharge status: Home / Self Care | Payer: Self-pay | Source: Ambulatory Visit | Attending: Cardiovascular Disease

## 2012-12-09 ENCOUNTER — Inpatient Hospital Stay (HOSPITAL_BASED_OUTPATIENT_CLINIC_OR_DEPARTMENT_OTHER)
Admission: RE | Admit: 2012-12-09 | Discharge: 2012-12-09 | Disposition: A | Discharge status: Home / Self Care | Payer: Medicare Other | Source: Ambulatory Visit | Attending: Cardiovascular Disease | Admitting: Cardiovascular Disease

## 2012-12-09 DIAGNOSIS — I10 Essential (primary) hypertension: Secondary | ICD-10-CM | POA: Insufficient documentation

## 2012-12-09 DIAGNOSIS — R0602 Shortness of breath: Secondary | ICD-10-CM

## 2012-12-09 DIAGNOSIS — R0609 Other forms of dyspnea: Secondary | ICD-10-CM | POA: Insufficient documentation

## 2012-12-09 DIAGNOSIS — R079 Chest pain, unspecified: Secondary | ICD-10-CM | POA: Insufficient documentation

## 2012-12-09 DIAGNOSIS — I2510 Atherosclerotic heart disease of native coronary artery without angina pectoris: Secondary | ICD-10-CM | POA: Insufficient documentation

## 2012-12-09 DIAGNOSIS — K219 Gastro-esophageal reflux disease without esophagitis: Secondary | ICD-10-CM | POA: Insufficient documentation

## 2012-12-09 DIAGNOSIS — R0989 Other specified symptoms and signs involving the circulatory and respiratory systems: Secondary | ICD-10-CM | POA: Insufficient documentation

## 2012-12-09 DIAGNOSIS — Z9861 Coronary angioplasty status: Secondary | ICD-10-CM | POA: Insufficient documentation

## 2012-12-09 LAB — POCT I-STAT 3, ART BLOOD GAS (G3+): pO2, Arterial: 96 mmHg (ref 80.0–100.0)

## 2012-12-09 LAB — POCT I-STAT 3, VENOUS BLOOD GAS (G3P V)
Acid-base deficit: 2 mmol/L (ref 0.0–2.0)
Bicarbonate: 23.3 mEq/L (ref 20.0–24.0)
pH, Ven: 7.36 — ABNORMAL HIGH (ref 7.250–7.300)
pO2, Ven: 37 mmHg (ref 30.0–45.0)

## 2012-12-09 SURGERY — JV LEFT AND RIGHT HEART CATHETERIZATION WITH CORONARY ANGIOGRAM

## 2012-12-09 MED ORDER — ONDANSETRON HCL 4 MG/2ML IJ SOLN: 4.0000 mg | Freq: Four times a day (QID) | INTRAMUSCULAR | Status: DC | PRN

## 2012-12-09 MED ORDER — SODIUM CHLORIDE 0.9 % IJ SOLN: 3.0000 mL | Freq: Two times a day (BID) | INTRAMUSCULAR | Status: DC

## 2012-12-09 MED ORDER — ACETAMINOPHEN 325 MG PO TABS: 650.0000 mg | ORAL_TABLET | ORAL | Status: DC | PRN

## 2012-12-09 MED ORDER — SODIUM CHLORIDE 0.9 % IJ SOLN: 3.0000 mL | INTRAMUSCULAR | Status: DC | PRN

## 2012-12-09 MED ORDER — ASPIRIN 81 MG PO CHEW
324.0000 mg | CHEWABLE_TABLET | ORAL | Status: AC
  Administered 2012-12-09: 324 mg via ORAL

## 2012-12-09 MED ORDER — SODIUM CHLORIDE 0.9 % IV SOLN: 250.0000 mL | INTRAVENOUS | Status: DC | PRN

## 2012-12-09 MED ORDER — SODIUM CHLORIDE 0.9 % IV SOLN: INTRAVENOUS | Status: DC

## 2012-12-09 MED ORDER — SODIUM CHLORIDE 0.9 % IV SOLN: INTRAVENOUS | Status: AC

## 2012-12-09 NOTE — CV Procedure (Signed)
    Cardiac Catheterization Operative Report  Betty Jackson [id number] 9/12/201410:10 AM Kristian Covey, MD  Procedure Performed:  1. Left Heart Catheterization 2. Selective Coronary Angiography 3. Right Heart Catheterization 4. Left ventricular angiogram  Operator: Verne Carrow, MD  Indication:  78 yo female with history of CAD s/p RCA stent 2005, HTN, GERD, temporal arteritis who has had recent dyspnea with minimal exertion which had been her anginal equivalent in the past. Also some chest pains. Concern for unstable angina. She is on maximal medical therapy.                                 Procedure Details: The risks, benefits, complications, treatment options, and expected outcomes were discussed with the patient. The patient and/or family concurred with the proposed plan, giving informed consent. The patient was brought to the cath lab after IV hydration was begun and oral premedication was given. The patient was further sedated with Versed and Fentanyl. The right groin was prepped and draped in the usual manner. Using the modified Seldinger access technique, a 4 French sheath was placed in the femoral artery. A 6 French sheath was inserted into the right femoral vein. A balloon tipped catheter was used to perform a right heart catheterization. Standard diagnostic catheters were used to perform selective coronary angiography. A pigtail catheter was used to perform a left ventricular angiogram. There were no immediate complications. The patient was taken to the recovery area in stable condition.   Hemodynamic Findings: Ao:  150/61            LV: 155/13/18 RA:   6             RV:  25/5/8 PA:  24/10 (mean 16)       PCWP:  9 Fick Cardiac Output: 3.9 L/min Fick Cardiac Index: 2.3 L/min/m2 Central Aortic Saturation: 98% Pulmonary Artery Saturation: 69%  Angiographic Findings:  Left main: 10% distal stenosis.   Left Anterior Descending Artery: Large caliber vessel that  courses to the apex. The proximal and mid vessel has moderate calcification. The mid vessel has a long 20% stenosis. There are two small caliber diagonal branches with mild plaque disease.   Circumflex Artery: Large caliber vessel with two small caliber obtuse marginal branches. No obstructive disease.   Right Coronary Artery: Large caliber, dominant vessel with a patent stent in the mid vessel. There is minimal stent restenosis. Just beyond the stent there is a 20% stenosis.   Left Ventricular Angiogram: LVEF=65%  Impression: 1. Stable single vessel CAD with patent mid RCA stent 2. Normal LV systolic function 3. Normal PA pressures  Recommendations: Continue current medical management.        Complications:  None; patient tolerated the procedure well.

## 2012-12-09 NOTE — OR Nursing (Signed)
+  Allen's test right hand 

## 2012-12-09 NOTE — OR Nursing (Signed)
Dr McAlhany at bedside to discuss results and treatment plan with pt and family 

## 2012-12-09 NOTE — Interval H&P Note (Signed)
History and Physical Interval Note:  12/09/2012 10:07 AM  Betty Jackson  has presented today for cardiac cath with the diagnosis of dyspnea, known CAD.   The various methods of treatment have been discussed with the patient and family. After consideration of risks, benefits and other options for treatment, the patient has consented to  Procedure(s): JV LEFT AND RIGHT HEART CATHETERIZATION WITH CORONARY ANGIOGRAM (N/A) as a surgical intervention .  The patient's history has been reviewed, patient examined, no change in status, stable for surgery.  I have reviewed the patient's chart and labs.  Questions were answered to the patient's satisfaction.    Cath Lab Visit (complete for each Cath Lab visit)  Clinical Evaluation Leading to the Procedure:   ACS: no  Non-ACS:    Anginal Classification: CCS III  Anti-ischemic medical therapy: Maximal Therapy (2 or more classes of medications)  Non-Invasive Test Results: No non-invasive testing performed  Prior CABG: No previous CABG        MCALHANY,CHRISTOPHER

## 2012-12-09 NOTE — OR Nursing (Signed)
Tegaderm dressing applied, site level 0, bedrest begins at 1100 

## 2012-12-09 NOTE — OR Nursing (Signed)
Discharge instructions reviewed and signed, pt stated understanding, ambulated in hall without difficulty, site level 0, transported to daughter's car via wheelchair 

## 2012-12-13 ENCOUNTER — Encounter: Payer: Self-pay | Admitting: Family Medicine

## 2012-12-13 ENCOUNTER — Ambulatory Visit (INDEPENDENT_AMBULATORY_CARE_PROVIDER_SITE_OTHER): Payer: Medicare Other | Admitting: Family Medicine

## 2012-12-13 ENCOUNTER — Other Ambulatory Visit (INDEPENDENT_AMBULATORY_CARE_PROVIDER_SITE_OTHER): Payer: Medicare Other

## 2012-12-13 VITALS — BP 138/74 | HR 72 | Temp 97.7°F | Wt 146.0 lb

## 2012-12-13 DIAGNOSIS — Z79899 Other long term (current) drug therapy: Secondary | ICD-10-CM | POA: Diagnosis not present

## 2012-12-13 DIAGNOSIS — F411 Generalized anxiety disorder: Secondary | ICD-10-CM

## 2012-12-13 DIAGNOSIS — K5150 Left sided colitis without complications: Secondary | ICD-10-CM | POA: Diagnosis not present

## 2012-12-13 DIAGNOSIS — I10 Essential (primary) hypertension: Secondary | ICD-10-CM

## 2012-12-13 LAB — SEDIMENTATION RATE: Sed Rate: 10 mm/hr (ref 0–22)

## 2012-12-13 NOTE — Progress Notes (Signed)
Chief Complaint  Patient presents with  . Hypertension    HPI:  [name] is a 78 yo F patient of Dr. Caryl Never here for an Acute visit for:  Elevated BP: -poor sleep last night because was anxious due to increased prednisone from her rheumatologist for herTA- she has anxiety tx by psych - her benzos help for this -checked BP at home when was worrying last night and was 180/100 she thinks - though doesn't remember  -takes norvasc 5mg , 50-12.5 daily and metoprolol 25 bid -denies: CP, SOB, DOE, palpitations, HA  ROS: See pertinent positives and negatives per HPI.  Past Medical History  Diagnosis Date  . Hypertension   . Hyperlipidemia   . Incontinence of urine   . CAD (coronary artery disease)     a. Stent RCA in [facility];  b. Cath approx 2009 - nonobs per pt report.  . Sinus bradycardia     a. on chronic bb  . PAF (paroxysmal atrial fibrillation)     a. lone epidode in 2011 according to notes.  . Depression   . Scoliosis   . Vocal cord dysfunction     "they don't operate properly" (10/28/2012)  . Diverticulosis   . Hemorrhoids   . Hiatal hernia   . Enteritis   . GERD (gastroesophageal reflux disease)   . Anxiety     "more so the last few days" (10/28/2012)  . Temporal arteritis 2011    a. followed @ [facility]; potential flareup 10/28/2012/notes 10/28/2012  . Exertional shortness of breath     "every now and then" (10/28/2012)  . Anemia     "one time" (10/28/2012)  . Migraines     "back in my 20's; none since" (10/28/2012)  . Arthritis      Right hemiarthroplasty after hip fracture 06/2011  . DDD (degenerative disc disease)   . Arthritis     "hands" (10/28/2012)  . Chronic lower back pain   . Anxiety state, unspecified 10/28/2012    Past Surgical History  Procedure Laterality Date  . Laparoscopic cholecystectomy  2001  . Tonsillectomy and adenoidectomy    . Abdominal hysterectomy  ~ 1984    TAH  . Bladder suspension    . Hemiarthroplasty hip  06/2011    fractured  her hip and repaired in [place], [place]  . Colonoscopy  01/26/2012    Procedure: COLONOSCOPY;  Surgeon: Meryl Dare, MD,FACG;  Location: [facility] ENDOSCOPY;  Service: Endoscopy;  Laterality: N/A;  note the EGD is possible  . Esophagogastroduodenoscopy  01/26/2012    Procedure: ESOPHAGOGASTRODUODENOSCOPY (EGD);  Surgeon: Meryl Dare, MD,FACG;  Location: [facility] ENDOSCOPY;  Service: Endoscopy;  Laterality: N/A;  . Cataract extraction w/ intraocular lens implant Right ~ 08/2012  . Coronary angioplasty with stent placement  2006  . Back surgery    . Temporal artery biopsy / ligation Bilateral 2011  . Tubal ligation  ~ 1982  . X-stop implantation  ~ 2010    "lower back" (10/28/2012)    Family History  Problem Relation Age of Onset  . Cancer Daughter     breast  . Arthritis Neg Hx   . Heart disease Other   . Hypertension Other   . Heart attack Father   . Cancer Mother     History   Social History  . Marital Status: Married    Spouse Name: N/A    Number of Children: 3  . Years of Education: N/A   Occupational History  .  Retired Veterinary surgeon    Social History Main Topics  . Smoking status: Never Smoker   . Smokeless tobacco: Never Used  . Alcohol Use: 0.6 oz/week    1 Glasses of wine per week     Comment: 10/28/2012 "maybe glass of wine q week"  . Drug Use: No  . Sexual Activity: Not Currently    Birth Control/ Protection: Surgical   Other Topics Concern  . None   Social History Narrative   Lives in [place] with her husband.  Retired Veterinary surgeon.    Current outpatient prescriptions:ALPRAZolam (XANAX) 0.5 MG tablet, Take 1 tablet (0.5 mg total) by mouth at bedtime as needed for sleep., Disp: 20 tablet, Rfl: 0;  amLODipine (NORVASC) 5 MG tablet, Take 5 mg by mouth daily., Disp: , Rfl: ;  atorvastatin (LIPITOR) 20 MG tablet, Take 20 mg by mouth daily., Disp: , Rfl: ;  calcium carbonate (OS-CAL) 600 MG TABS, Take 600 mg by mouth daily., Disp: , Rfl:  cholecalciferol (VITAMIN D) 1000 UNITS  tablet, Take 1,000 Units by mouth 2 (two) times daily. , Disp: , Rfl: ;  clopidogrel (PLAVIX) 75 MG tablet, Take 1 tablet (75 mg total) by mouth daily with breakfast., Disp: 30 tablet, Rfl: 5;  glucose blood (FREESTYLE LITE) test strip, Patient testing BS once daily. Diag code 446.5, Disp: 100 each, Rfl: 3 HYDROcodone-acetaminophen (NORCO/VICODIN) 5-325 MG per tablet, Take 1 tablet by mouth every 6 (six) hours as needed for pain., Disp: , Rfl: ;  losartan-hydrochlorothiazide (HYZAAR) 50-12.5 MG per tablet, Take 1 tablet by mouth daily., Disp: 90 tablet, Rfl: 3;  metoprolol tartrate (LOPRESSOR) 25 MG tablet, Take 25 mg by mouth 2 (two) times daily., Disp: , Rfl: ;  Multiple Vitamins-Minerals (ZINC PO), Take 1 capsule by mouth daily., Disp: , Rfl:  Nutritional Supplements (JUICE PLUS FIBRE PO), Take 1 capsule by mouth daily., Disp: , Rfl: ;  pantoprazole (PROTONIX) 40 MG tablet, Take 1 tablet (40 mg total) by mouth daily., Disp: 30 tablet, Rfl: 6;  predniSONE (DELTASONE) 20 MG tablet, Take 50 mg by mouth daily before breakfast., Disp: , Rfl: ;  temazepam (RESTORIL) 15 MG capsule, TAKE 2 CAPSULES BY MOUTH AT BEDTIME AS NEEDED FOR SLEEP, Disp: 60 capsule, Rfl: 0 vitamin C (ASCORBIC ACID) 500 MG tablet, Take 1,000 mg by mouth daily. For sore throat, Disp: , Rfl:   EXAM:  Filed Vitals:   12/13/12 1435  BP: 138/74  Pulse: 72  Temp: 97.7 F (36.5 C)    Body mass index is 25.05 kg/(m^2).  GENERAL: vitals reviewed and listed above, alert, oriented, appears well hydrated and in no acute distress  HEENT: atraumatic, conjunttiva clear, no obvious abnormalities on inspection of external nose and ears  NECK: no obvious masses on inspection  LUNGS: clear to auscultation bilaterally, no wheezes, rales or rhonchi, good air movement  CV: HRRR, no peripheral edema  MS: moves all extremities without noticeable abnormality  PSYCH: pleasant and cooperative, no obvious depression or anxiety  ASSESSMENT AND  PLAN:  Discussed the following assessment and plan:  Hypertension  Anxiety state, unspecified  -blood pressure great after sitting here -advised no change to her medications at this time -advised to check at home from time to time when NOT anxious and after sitting for 5 minutes and notify PCP if running high or other concerns -follow up as needed with PCP  -Patient advised to return or notify a doctor immediately if symptoms worsen or persist or new concerns arise.  There are  no Patient Instructions on file for this visit.   Colin Benton R.

## 2012-12-14 ENCOUNTER — Other Ambulatory Visit: Payer: Medicare Other

## 2012-12-16 ENCOUNTER — Ambulatory Visit
Admission: RE | Admit: 2012-12-16 | Discharge: 2012-12-16 | Disposition: A | Discharge status: Home / Self Care | Payer: Medicare Other | Source: Ambulatory Visit | Attending: Orthopaedic Surgery | Admitting: Orthopaedic Surgery

## 2012-12-16 DIAGNOSIS — M5137 Other intervertebral disc degeneration, lumbosacral region: Secondary | ICD-10-CM

## 2012-12-16 DIAGNOSIS — IMO0002 Reserved for concepts with insufficient information to code with codable children: Secondary | ICD-10-CM | POA: Diagnosis not present

## 2012-12-16 DIAGNOSIS — M48061 Spinal stenosis, lumbar region without neurogenic claudication: Secondary | ICD-10-CM | POA: Diagnosis not present

## 2012-12-19 DIAGNOSIS — M5137 Other intervertebral disc degeneration, lumbosacral region: Secondary | ICD-10-CM | POA: Diagnosis not present

## 2012-12-19 DIAGNOSIS — L820 Inflamed seborrheic keratosis: Secondary | ICD-10-CM | POA: Diagnosis not present

## 2012-12-19 DIAGNOSIS — L851 Acquired keratosis [keratoderma] palmaris et plantaris: Secondary | ICD-10-CM | POA: Diagnosis not present

## 2012-12-19 DIAGNOSIS — M316 Other giant cell arteritis: Secondary | ICD-10-CM | POA: Diagnosis not present

## 2012-12-19 DIAGNOSIS — L738 Other specified follicular disorders: Secondary | ICD-10-CM | POA: Diagnosis not present

## 2012-12-19 DIAGNOSIS — M4150 Other secondary scoliosis, site unspecified: Secondary | ICD-10-CM | POA: Diagnosis not present

## 2012-12-30 ENCOUNTER — Ambulatory Visit (INDEPENDENT_AMBULATORY_CARE_PROVIDER_SITE_OTHER): Payer: Medicare Other | Admitting: Cardiovascular Disease

## 2012-12-30 ENCOUNTER — Encounter: Payer: Self-pay | Admitting: Cardiovascular Disease

## 2012-12-30 VITALS — BP 151/74 | HR 77 | Ht 64.5 in | Wt 143.0 lb

## 2012-12-30 DIAGNOSIS — I10 Essential (primary) hypertension: Secondary | ICD-10-CM | POA: Diagnosis not present

## 2012-12-30 DIAGNOSIS — I2510 Atherosclerotic heart disease of native coronary artery without angina pectoris: Secondary | ICD-10-CM

## 2012-12-30 MED ORDER — AMLODIPINE BESYLATE 10 MG PO TABS: 10.0000 mg | ORAL_TABLET | Freq: Every day | ORAL | Status: DC

## 2012-12-30 NOTE — Progress Notes (Signed)
History of Present Illness: 78 yo female with history of CAD s/p stent RCA, HTN, HLD, GERD, temporal arteritis here today for cardiac follow up. She had a stent placed in the RCA in [place], [place] in 2005. She moved to [facility] for treatment of her temporal arteritis at [facility]. 78 y.o. female who returns for the evaluation of dyspnea.  ABIs 12/12: Normal. Patient was admitted 8/1-10/30/12 with a suspected acute CVA (punctate infarct and right caudate nucleus). She was placed on Plavix per neurology recommendations. Carotid US 8/14: Bilateral less than 39% ICA stenosis. Echocardiogram 10/30/12: EF 65-70%, normal wall motion, PASP 34. Her ESR was elevated and visual changes involved the right eye. It was felt the patient had a flare of her temporal arteritis and a punctate stroke seen on MRI may be a manifestation of the temporal arteritis. The infarct was felt to be asymptomatic. She was also placed on steroids. She had some dyspnea and chest pain so cardiac cath was arranged. Cardiac cath 12/09/12 with patent RCA stent, mild disease LAD and Circumfelx, normal filling pressures.   She is here today for follow up. She tells me that she feels well. She has had no chest pain or SOB.   Primary Care Physician: Evelena Peat, MD  Fasting Lipid Profile:  Lipid Panel     Component Value Date/Time   CHOL 120 10/30/2012 0510   TRIG 86 10/30/2012 0510   HDL 58 10/30/2012 0510   CHOLHDL 2.1 10/30/2012 0510   VLDL 17 10/30/2012 0510   LDLCALC 45 10/30/2012 0510     Past Medical History  Diagnosis Date  . Hypertension   . Hyperlipidemia   . Incontinence of urine   . CAD (coronary artery disease)     a. Stent RCA in [facility];  b. Cath approx 2009 - nonobs per pt report.  . Sinus bradycardia     a. on chronic bb  . PAF (paroxysmal atrial fibrillation)     a. lone epidode in 2011 according to notes.  . Depression   . Scoliosis   . Vocal cord dysfunction     "they don't operate properly" (10/28/2012)  .  Diverticulosis   . Hemorrhoids   . Hiatal hernia   . Enteritis   . GERD (gastroesophageal reflux disease)   . Anxiety     "more so the last few days" (10/28/2012)  . Temporal arteritis 2011    a. followed @ [facility]; potential flareup 10/28/2012/notes 10/28/2012  . Exertional shortness of breath     "every now and then" (10/28/2012)  . Anemia     "one time" (10/28/2012)  . Migraines     "back in my 20's; none since" (10/28/2012)  . Arthritis      Right hemiarthroplasty after hip fracture 06/2011  . DDD (degenerative disc disease)   . Arthritis     "hands" (10/28/2012)  . Chronic lower back pain   . Anxiety state, unspecified 10/28/2012    Past Surgical History  Procedure Laterality Date  . Laparoscopic cholecystectomy  2001  . Tonsillectomy and adenoidectomy    . Abdominal hysterectomy  ~ 1984    TAH  . Bladder suspension    . Hemiarthroplasty hip  06/2011    fractured her hip and repaired in [place], [place]  . Colonoscopy  01/26/2012    Procedure: COLONOSCOPY;  Surgeon: Meryl Dare, MD,FACG;  Location: [facility] ENDOSCOPY;  Service: Endoscopy;  Laterality: N/A;  note the EGD is possible  . Esophagogastroduodenoscopy  01/26/2012    Procedure: ESOPHAGOGASTRODUODENOSCOPY (EGD);  Surgeon: Meryl Dare, MD,FACG;  Location: [facility] ENDOSCOPY;  Service: Endoscopy;  Laterality: N/A;  . Cataract extraction w/ intraocular lens implant Right ~ 08/2012  . Coronary angioplasty with stent placement  2006  . Back surgery    . Temporal artery biopsy / ligation Bilateral 2011  . Tubal ligation  ~ 1982  . X-stop implantation  ~ 2010    "lower back" (10/28/2012)    Current Outpatient Prescriptions  Medication Sig Dispense Refill  . ALPRAZolam (XANAX) 0.5 MG tablet Take 1 tablet (0.5 mg total) by mouth at bedtime as needed for sleep.  20 tablet  0  . amLODipine (NORVASC) 5 MG tablet Take 5 mg by mouth daily.      Marland Kitchen atorvastatin (LIPITOR) 20 MG tablet Take 20 mg by mouth daily.      . calcium carbonate (OS-CAL) 600 MG  TABS Take 600 mg by mouth daily.      . cholecalciferol (VITAMIN D) 1000 UNITS tablet Take 1,000 Units by mouth 2 (two) times daily.       . clopidogrel (PLAVIX) 75 MG tablet Take 1 tablet (75 mg total) by mouth daily with breakfast.  30 tablet  5  . glucose blood (FREESTYLE LITE) test strip Patient testing BS once daily. Diag code 446.5  100 each  3  . HYDROcodone-acetaminophen (NORCO/VICODIN) 5-325 MG per tablet Take 1 tablet by mouth every 6 (six) hours as needed for pain.      Marland Kitchen losartan-hydrochlorothiazide (HYZAAR) 50-12.5 MG per tablet Take 1 tablet by mouth daily.  90 tablet  3  . metoprolol tartrate (LOPRESSOR) 25 MG tablet Take 25 mg by mouth 2 (two) times daily.      . Multiple Vitamins-Minerals (ZINC PO) Take 1 capsule by mouth daily.      . Nutritional Supplements (JUICE PLUS FIBRE PO) Take 1 capsule by mouth daily.      . pantoprazole (PROTONIX) 40 MG tablet Take 1 tablet (40 mg total) by mouth daily.  30 tablet  6  . predniSONE (DELTASONE) 20 MG tablet Take 35 mg by mouth daily before breakfast.       . temazepam (RESTORIL) 15 MG capsule TAKE 2 CAPSULES BY MOUTH AT BEDTIME AS NEEDED FOR SLEEP  60 capsule  0  . vitamin C (ASCORBIC ACID) 500 MG tablet Take 1,000 mg by mouth daily. For sore throat       No current facility-administered medications for this visit.    Allergies  Allergen Reactions  . Tussionex Pennkinetic Er [Hydrocod Polst-Cpm Polst Er] Hives    History   Social History  . Marital Status: Married    Spouse Name: N/A    Number of Children: 3  . Years of Education: N/A   Occupational History  . Retired Veterinary surgeon    Social History Main Topics  . Smoking status: Never Smoker   . Smokeless tobacco: Never Used  . Alcohol Use: 0.6 oz/week    1 Glasses of wine per week     Comment: 10/28/2012 "maybe glass of wine q week"  . Drug Use: No  . Sexual Activity: Not Currently    Birth Control/ Protection: Surgical   Other Topics Concern  . Not on file   Social  History Narrative   Lives in [place] with her husband.  Retired Veterinary surgeon.    Family History  Problem Relation Age of Onset  . Cancer Daughter     breast  . Arthritis  Neg Hx   . Heart disease Other   . Hypertension Other   . Heart attack Father   . Cancer Mother     Review of Systems:  As stated in the HPI and otherwise negative.   BP 151/74  Pulse 77  Ht 5' 4.5" (1.638 m)  Wt 143 lb (64.864 kg)  BMI 24.18 kg/m2  Physical Examination: General: Well developed, well nourished, NAD HEENT: OP clear, mucus membranes moist SKIN: warm, dry. No rashes. Neuro: No focal deficits Musculoskeletal: Muscle strength 5/5 all ext Psychiatric: Mood and affect normal Neck: No JVD, no carotid bruits, no thyromegaly, no lymphadenopathy. Lungs:Clear bilaterally, no wheezes, rhonci, crackles Cardiovascular: Regular rate and rhythm. No murmurs, gallops or rubs. Abdomen:Soft. Bowel sounds present. Non-tender.  Extremities: No lower extremity edema. Pulses are 2 + in the left  DP/PT and the right PT. Could not find right DP with doppler.   Cardiac cath 12/09/12: Ao: 150/61  LV: 155/13/18  RA: 6  RV: 25/5/8  PA: 24/10 (mean 16)  PCWP: 9  Fick Cardiac Output: 3.9 L/min  Fick Cardiac Index: 2.3 L/min/m2  Central Aortic Saturation: 98%  Pulmonary Artery Saturation: 69%  Angiographic Findings:  Left main: 10% distal stenosis.  Left Anterior Descending Artery: Large caliber vessel that courses to the apex. The proximal and mid vessel has moderate calcification. The mid vessel has a long 20% stenosis. There are two small caliber diagonal branches with mild plaque disease.  Circumflex Artery: Large caliber vessel with two small caliber obtuse marginal branches. No obstructive disease.  Right Coronary Artery: Large caliber, dominant vessel with a patent stent in the mid vessel. There is minimal stent restenosis. Just beyond the stent there is a 20% stenosis.  Left Ventricular Angiogram: LVEF=65%    Impression:  1. Stable single vessel CAD with patent mid RCA stent  2. Normal LV systolic function  3. Normal PA pressures  Assessment and Plan:   1. CAD: Stable by recent cath September 2014. Continue Plavix, statin, beta blocker.   2. HTN: BP elevated. Increase Norvasc to 10 mg po Qdaily.

## 2012-12-30 NOTE — Patient Instructions (Addendum)
Your physician wants you to follow-up in: 6 months.  You will receive a reminder letter in the mail two months in advance. If you don't receive a letter, please call our office to schedule the follow-up appointment.  Your physician has recommended you make the following change in your medication:  Increase Norvasc to 10 mg by mouth daily  

## 2013-01-02 DIAGNOSIS — M5137 Other intervertebral disc degeneration, lumbosacral region: Secondary | ICD-10-CM | POA: Diagnosis not present

## 2013-01-02 DIAGNOSIS — M4120 Other idiopathic scoliosis, site unspecified: Secondary | ICD-10-CM | POA: Diagnosis not present

## 2013-01-02 DIAGNOSIS — M545 Low back pain: Secondary | ICD-10-CM | POA: Diagnosis not present

## 2013-01-02 DIAGNOSIS — M48061 Spinal stenosis, lumbar region without neurogenic claudication: Secondary | ICD-10-CM | POA: Diagnosis not present

## 2013-01-03 DIAGNOSIS — L820 Inflamed seborrheic keratosis: Secondary | ICD-10-CM | POA: Diagnosis not present

## 2013-01-03 DIAGNOSIS — Z79899 Other long term (current) drug therapy: Secondary | ICD-10-CM | POA: Diagnosis not present

## 2013-01-03 DIAGNOSIS — L988 Other specified disorders of the skin and subcutaneous tissue: Secondary | ICD-10-CM | POA: Diagnosis not present

## 2013-01-03 DIAGNOSIS — L851 Acquired keratosis [keratoderma] palmaris et plantaris: Secondary | ICD-10-CM | POA: Diagnosis not present

## 2013-01-03 DIAGNOSIS — M4150 Other secondary scoliosis, site unspecified: Secondary | ICD-10-CM | POA: Diagnosis not present

## 2013-01-03 DIAGNOSIS — M5137 Other intervertebral disc degeneration, lumbosacral region: Secondary | ICD-10-CM | POA: Diagnosis not present

## 2013-01-03 DIAGNOSIS — M316 Other giant cell arteritis: Secondary | ICD-10-CM | POA: Diagnosis not present

## 2013-01-03 DIAGNOSIS — R21 Rash and other nonspecific skin eruption: Secondary | ICD-10-CM | POA: Diagnosis not present

## 2013-01-09 DIAGNOSIS — M316 Other giant cell arteritis: Secondary | ICD-10-CM | POA: Diagnosis not present

## 2013-01-09 DIAGNOSIS — Z79899 Other long term (current) drug therapy: Secondary | ICD-10-CM | POA: Diagnosis not present

## 2013-01-10 ENCOUNTER — Encounter: Payer: Self-pay | Admitting: Family Medicine

## 2013-01-10 ENCOUNTER — Ambulatory Visit (INDEPENDENT_AMBULATORY_CARE_PROVIDER_SITE_OTHER): Payer: Medicare Other | Admitting: Family Medicine

## 2013-01-10 VITALS — BP 132/72 | HR 65 | Temp 98.0°F | Wt 155.0 lb

## 2013-01-10 DIAGNOSIS — I2510 Atherosclerotic heart disease of native coronary artery without angina pectoris: Secondary | ICD-10-CM

## 2013-01-10 DIAGNOSIS — Z79899 Other long term (current) drug therapy: Secondary | ICD-10-CM | POA: Diagnosis not present

## 2013-01-10 DIAGNOSIS — M316 Other giant cell arteritis: Secondary | ICD-10-CM

## 2013-01-10 DIAGNOSIS — R600 Localized edema: Secondary | ICD-10-CM

## 2013-01-10 DIAGNOSIS — M545 Low back pain: Secondary | ICD-10-CM | POA: Diagnosis not present

## 2013-01-10 DIAGNOSIS — I10 Essential (primary) hypertension: Secondary | ICD-10-CM

## 2013-01-10 DIAGNOSIS — R609 Edema, unspecified: Secondary | ICD-10-CM

## 2013-01-10 LAB — SEDIMENTATION RATE: Sed Rate: 9 mm/hr (ref 0–22)

## 2013-01-10 MED ORDER — LOSARTAN POTASSIUM 100 MG PO TABS: 100.0000 mg | ORAL_TABLET | Freq: Every day | ORAL | Status: DC

## 2013-01-10 MED ORDER — HYDROCHLOROTHIAZIDE 12.5 MG PO CAPS: 12.5000 mg | ORAL_CAPSULE | Freq: Every day | ORAL | Status: DC

## 2013-01-10 NOTE — Progress Notes (Signed)
Subjective:    Patient ID: Betty Jackson, female    DOB: 03-15-1935, 78 y.o.   MRN: [id number]  HPI Patient seen with complaints of increased peripheral edema. Multiple chronic problems including history of CAD, cerebrovascular disease, hypertension, atrial fibrillation, temporal arteritis. Currently maintained on prednisone 30 mg daily and this is being slowly tapered down. Recent increase amlodipine to 10 mg secondary to elevated systolic blood pressure. Since that time, she's had increased peripheral edema legs and ankles which she states is quite bothersome. She also remains on metoprolol and losartan HCTZ for hypertension. No orthostasis.  She's had tendencies toward mild hyponatremia in the past. Patient has had some mild recent dyspnea. Pending referral to pulmonologist. Recent cardiac catheterization revealed patent RCA stent. Ejection fraction 65-70%. Denies any cough. No orthopnea  Past Medical History  Diagnosis Date  . Hypertension   . Hyperlipidemia   . Incontinence of urine   . CAD (coronary artery disease)     a. Stent RCA in [facility];  b. Cath approx 2009 - nonobs per pt report.  . Sinus bradycardia     a. on chronic bb  . PAF (paroxysmal atrial fibrillation)     a. lone epidode in 2011 according to notes.  . Depression   . Scoliosis   . Vocal cord dysfunction     "they don't operate properly" (10/28/2012)  . Diverticulosis   . Hemorrhoids   . Hiatal hernia   . Enteritis   . GERD (gastroesophageal reflux disease)   . Anxiety     "more so the last few days" (10/28/2012)  . Temporal arteritis 2011    a. followed @ [facility]; potential flareup 10/28/2012/notes 10/28/2012  . Exertional shortness of breath     "every now and then" (10/28/2012)  . Anemia     "one time" (10/28/2012)  . Migraines     "back in my 20's; none since" (10/28/2012)  . Arthritis      Right hemiarthroplasty after hip fracture 06/2011  . DDD (degenerative disc disease)   . Arthritis     "hands"  (10/28/2012)  . Chronic lower back pain   . Anxiety state, unspecified 10/28/2012   Past Surgical History  Procedure Laterality Date  . Laparoscopic cholecystectomy  2001  . Tonsillectomy and adenoidectomy    . Abdominal hysterectomy  ~ 1984    TAH  . Bladder suspension    . Hemiarthroplasty hip  06/2011    fractured her hip and repaired in [place], [place]  . Colonoscopy  01/26/2012    Procedure: COLONOSCOPY;  Surgeon: Meryl Dare, MD,FACG;  Location: [facility] ENDOSCOPY;  Service: Endoscopy;  Laterality: N/A;  note the EGD is possible  . Esophagogastroduodenoscopy  01/26/2012    Procedure: ESOPHAGOGASTRODUODENOSCOPY (EGD);  Surgeon: Meryl Dare, MD,FACG;  Location: [facility] ENDOSCOPY;  Service: Endoscopy;  Laterality: N/A;  . Cataract extraction w/ intraocular lens implant Right ~ 08/2012  . Coronary angioplasty with stent placement  2006  . Back surgery    . Temporal artery biopsy / ligation Bilateral 2011  . Tubal ligation  ~ 1982  . X-stop implantation  ~ 2010    "lower back" (10/28/2012)    reports that she has never smoked. She has never used smokeless tobacco. She reports that she drinks about 0.6 ounces of alcohol per week. She reports that she does not use illicit drugs. family history includes Cancer in her daughter and mother; Heart attack in her father; Heart disease in her other; Hypertension in  her other. There is no history of Arthritis. Allergies  Allergen Reactions  . Tussionex Pennkinetic Er [Hydrocod Polst-Cpm Polst Er] Hives      Review of Systems  Constitutional: Negative for fever, appetite change and unexpected weight change.  Respiratory: Positive for shortness of breath. Negative for cough and wheezing.   Cardiovascular: Positive for leg swelling. Negative for chest pain and palpitations.  Endocrine: Negative for polydipsia and polyuria.  Genitourinary: Negative for dysuria.  Neurological: Negative for dizziness, syncope and weakness.  Hematological: Negative for  adenopathy.       Objective:   Physical Exam  Constitutional: She is oriented to person, place, and time. She appears well-developed and well-nourished.  Neck: Neck supple. No thyromegaly present.  Cardiovascular: Normal rate and regular rhythm.   Pulmonary/Chest: Effort normal and breath sounds normal. No respiratory distress. She has no wheezes. She has no rales.  Musculoskeletal: She exhibits edema.  Neurological: She is alert and oriented to person, place, and time.          Assessment & Plan:  #1 increased peripheral edema likely exacerbated by increased dose of amlodipine in a patient on prednisone. See below #2 hypertension. Currently improved control but increased edema issues on increased amlodipine. We discussed going back to 5 mg amlodipine and titrating her losartan to 100 mg and continue HCTZ 12.5 mg. Would be reluctant to increase her HCTZ further given her tendencies toward mild hyponatremia. Reassess blood pressure in one month #3 temporal arteritis. Repeat sedimentation rate today. Her prednisone is being regulated by rheumatologist in [place], [place]

## 2013-01-10 NOTE — Patient Instructions (Signed)
Go back to Amlodipine 5 mg once daily Stop Losartan-HCTZ 50/12.5 mg  Start Losartan 100 mg once daily Start HCTZ 12.5 mg once daily.

## 2013-01-17 DIAGNOSIS — M545 Low back pain: Secondary | ICD-10-CM | POA: Diagnosis not present

## 2013-01-20 DIAGNOSIS — R0609 Other forms of dyspnea: Secondary | ICD-10-CM | POA: Diagnosis not present

## 2013-01-20 DIAGNOSIS — R0602 Shortness of breath: Secondary | ICD-10-CM | POA: Diagnosis not present

## 2013-01-20 DIAGNOSIS — M316 Other giant cell arteritis: Secondary | ICD-10-CM | POA: Diagnosis not present

## 2013-01-20 DIAGNOSIS — M069 Rheumatoid arthritis, unspecified: Secondary | ICD-10-CM | POA: Diagnosis not present

## 2013-01-20 LAB — PULMONARY FUNCTION TEST

## 2013-01-24 ENCOUNTER — Other Ambulatory Visit (INDEPENDENT_AMBULATORY_CARE_PROVIDER_SITE_OTHER): Payer: Medicare Other

## 2013-01-24 DIAGNOSIS — M316 Other giant cell arteritis: Secondary | ICD-10-CM | POA: Diagnosis not present

## 2013-01-24 DIAGNOSIS — M545 Low back pain: Secondary | ICD-10-CM | POA: Diagnosis not present

## 2013-01-24 DIAGNOSIS — Z79899 Other long term (current) drug therapy: Secondary | ICD-10-CM | POA: Diagnosis not present

## 2013-01-24 DIAGNOSIS — E785 Hyperlipidemia, unspecified: Secondary | ICD-10-CM

## 2013-01-24 LAB — CBC WITH DIFFERENTIAL/PLATELET
Basophils Absolute: 0 10*3/uL (ref 0.0–0.1)
Basophils Relative: 0.3 % (ref 0.0–3.0)
Eosinophils Absolute: 0.1 10*3/uL (ref 0.0–0.7)
Eosinophils Relative: 0.6 % (ref 0.0–5.0)
HCT: 37.3 % (ref 36.0–46.0)
Hemoglobin: 12.7 g/dL (ref 12.0–15.0)
Lymphocytes Relative: 26.1 % (ref 12.0–46.0)
Lymphs Abs: 3.3 10*3/uL (ref 0.7–4.0)
MCHC: 33.9 g/dL (ref 30.0–36.0)
MCV: 90.4 fl (ref 78.0–100.0)
Monocytes Absolute: 0.7 10*3/uL (ref 0.1–1.0)
Monocytes Relative: 5.4 % (ref 3.0–12.0)
Neutro Abs: 8.6 10*3/uL — ABNORMAL HIGH (ref 1.4–7.7)
Neutrophils Relative %: 67.6 % (ref 43.0–77.0)
Platelets: 217 10*3/uL (ref 150.0–400.0)
RBC: 4.13 Mil/uL (ref 3.87–5.11)
RDW: 15.4 % — ABNORMAL HIGH (ref 11.5–14.6)
WBC: 12.6 10*3/uL — ABNORMAL HIGH (ref 4.5–10.5)

## 2013-01-24 LAB — ELECTROLYTE PANEL
CO2: 31 mEq/L (ref 19–32)
Chloride: 99 mEq/L (ref 96–112)
Potassium: 5.6 mEq/L — ABNORMAL HIGH (ref 3.5–5.1)
Sodium: 139 mEq/L (ref 135–145)

## 2013-01-24 LAB — COMPREHENSIVE METABOLIC PANEL
ALT: 43 U/L — ABNORMAL HIGH (ref 0–35)
AST: 32 U/L (ref 0–37)
Albumin: 4 g/dL (ref 3.5–5.2)
Alkaline Phosphatase: 47 U/L (ref 39–117)
BUN: 18 mg/dL (ref 6–23)
CO2: 31 mEq/L (ref 19–32)
Calcium: 9.6 mg/dL (ref 8.4–10.5)
Chloride: 99 mEq/L (ref 96–112)
Creatinine, Ser: 1 mg/dL (ref 0.4–1.2)
GFR: 56.56 mL/min — ABNORMAL LOW (ref >60.00)
Glucose, Bld: 104 mg/dL — ABNORMAL HIGH (ref 70–99)
Potassium: 5.6 mEq/L — ABNORMAL HIGH (ref 3.5–5.1)
Sodium: 139 mEq/L (ref 135–145)
Total Bilirubin: 1 mg/dL (ref 0.3–1.2)
Total Protein: 6.2 g/dL (ref 6.0–8.3)

## 2013-01-24 LAB — SEDIMENTATION RATE: Sed Rate: 7 mm/hr (ref 0–22)

## 2013-01-24 LAB — HIGH SENSITIVITY CRP: CRP, High Sensitivity: 0.36 mg/L (ref 0.000–5.000)

## 2013-01-27 DIAGNOSIS — L738 Other specified follicular disorders: Secondary | ICD-10-CM | POA: Diagnosis not present

## 2013-01-27 DIAGNOSIS — L259 Unspecified contact dermatitis, unspecified cause: Secondary | ICD-10-CM | POA: Diagnosis not present

## 2013-01-31 ENCOUNTER — Telehealth: Payer: Self-pay | Admitting: Family Medicine

## 2013-01-31 DIAGNOSIS — M545 Low back pain: Secondary | ICD-10-CM | POA: Diagnosis not present

## 2013-01-31 NOTE — Telephone Encounter (Signed)
Pt aware of CRP and sed rate results and also that labs were faxed to Dr. Clovis Riley at [facility]. Fax number confirmed by pt.

## 2013-01-31 NOTE — Telephone Encounter (Signed)
Pt states you were to fax her sed rate to [facility] but they have not received and her c reactive protein. Pt would like to know if it was faxed to Dr Clovis Riley. Also pt would like to know what it is. Pt states is not on mychart. Pt states this info is critical and needs to know asap.

## 2013-02-02 ENCOUNTER — Other Ambulatory Visit: Payer: Self-pay

## 2013-02-06 DIAGNOSIS — M316 Other giant cell arteritis: Secondary | ICD-10-CM | POA: Diagnosis not present

## 2013-02-06 DIAGNOSIS — K769 Liver disease, unspecified: Secondary | ICD-10-CM | POA: Diagnosis not present

## 2013-02-06 DIAGNOSIS — Z79899 Other long term (current) drug therapy: Secondary | ICD-10-CM | POA: Diagnosis not present

## 2013-02-07 ENCOUNTER — Encounter: Payer: Self-pay | Admitting: Family Medicine

## 2013-02-07 ENCOUNTER — Ambulatory Visit (INDEPENDENT_AMBULATORY_CARE_PROVIDER_SITE_OTHER): Payer: Medicare Other | Admitting: Family Medicine

## 2013-02-07 VITALS — BP 138/84 | HR 66 | Temp 97.9°F | Wt 159.0 lb

## 2013-02-07 DIAGNOSIS — M545 Low back pain: Secondary | ICD-10-CM | POA: Diagnosis not present

## 2013-02-07 DIAGNOSIS — G4700 Insomnia, unspecified: Secondary | ICD-10-CM

## 2013-02-07 DIAGNOSIS — R600 Localized edema: Secondary | ICD-10-CM

## 2013-02-07 DIAGNOSIS — Z79899 Other long term (current) drug therapy: Secondary | ICD-10-CM

## 2013-02-07 DIAGNOSIS — F5104 Psychophysiologic insomnia: Secondary | ICD-10-CM

## 2013-02-07 DIAGNOSIS — R609 Edema, unspecified: Secondary | ICD-10-CM

## 2013-02-07 HISTORY — DX: Psychophysiologic insomnia: F51.04

## 2013-02-07 LAB — POCT URINALYSIS DIPSTICK
Bilirubin, UA: NEGATIVE
Glucose, UA: NEGATIVE
Ketones, UA: NEGATIVE
Leukocytes, UA: NEGATIVE
pH, UA: 6.5

## 2013-02-07 MED ORDER — TEMAZEPAM 15 MG PO CAPS: 15.0000 mg | ORAL_CAPSULE | Freq: Every evening | ORAL | Status: DC | PRN

## 2013-02-07 NOTE — Progress Notes (Signed)
Subjective:    Patient ID: Betty Jackson, female    DOB: 07-19-1934, 78 y.o.   MRN: [id number]  HPI  Here for follow several items as follows: She has temporal arteritis and is maintained on prednisone 25 mg daily. Followed by rheumatologist in [facility]. Recently had lab apparently with mildly elevated liver transaminase. No abdominal pain. No nausea or vomiting. No other evidence for liver dysfunction. Rheumatologist had requested liver ultrasound. Patient has not had any past history of liver issues. No history of hepatitis. Recent weight gain on prednisone.  Seen recently for edema. We reduced her amlodipine back to 5 mg and increased her losartan 100 mg and edema has resolved. Her blood pressure remains well controlled. No dizziness.  She's had some gradual weight gain with prednisone. She is frustrated with this. She hopes to taper down prednisone soon. She's had some chronic insomnia. Takes temazepam 15 mg each bedtime as needed. She has taken occasional daytime alprazolam in past but she never takes these together and we have encouraged her to leave off the alprazolam altogether if possible.  Past Medical History  Diagnosis Date  . Hypertension   . Hyperlipidemia   . Incontinence of urine   . CAD (coronary artery disease)     a. Stent RCA in [facility];  b. Cath approx 2009 - nonobs per pt report.  . Sinus bradycardia     a. on chronic bb  . PAF (paroxysmal atrial fibrillation)     a. lone epidode in 2011 according to notes.  . Depression   . Scoliosis   . Vocal cord dysfunction     "they don't operate properly" (10/28/2012)  . Diverticulosis   . Hemorrhoids   . Hiatal hernia   . Enteritis   . GERD (gastroesophageal reflux disease)   . Anxiety     "more so the last few days" (10/28/2012)  . Temporal arteritis 2011    a. followed @ [facility]; potential flareup 10/28/2012/notes 10/28/2012  . Exertional shortness of breath     "every now and then" (10/28/2012)  .  Anemia     "one time" (10/28/2012)  . Migraines     "back in my 20's; none since" (10/28/2012)  . Arthritis      Right hemiarthroplasty after hip fracture 06/2011  . DDD (degenerative disc disease)   . Arthritis     "hands" (10/28/2012)  . Chronic lower back pain   . Anxiety state, unspecified 10/28/2012   Past Surgical History  Procedure Laterality Date  . Laparoscopic cholecystectomy  2001  . Tonsillectomy and adenoidectomy    . Abdominal hysterectomy  ~ 1984    TAH  . Bladder suspension    . Hemiarthroplasty hip  06/2011    fractured her hip and repaired in [place], [place]  . Colonoscopy  01/26/2012    Procedure: COLONOSCOPY;  Surgeon: Meryl Dare, MD,FACG;  Location: [facility] ENDOSCOPY;  Service: Endoscopy;  Laterality: N/A;  note the EGD is possible  . Esophagogastroduodenoscopy  01/26/2012    Procedure: ESOPHAGOGASTRODUODENOSCOPY (EGD);  Surgeon: Meryl Dare, MD,FACG;  Location: [facility] ENDOSCOPY;  Service: Endoscopy;  Laterality: N/A;  . Cataract extraction w/ intraocular lens implant Right ~ 08/2012  . Coronary angioplasty with stent placement  2006  . Back surgery    . Temporal artery biopsy / ligation Bilateral 2011  . Tubal ligation  ~ 1982  . X-stop implantation  ~ 2010    "lower back" (10/28/2012)    reports that  she has never smoked. She has never used smokeless tobacco. She reports that she drinks about 0.6 ounces of alcohol per week. She reports that she does not use illicit drugs. family history includes Cancer in her daughter and mother; Heart attack in her father; Heart disease in her other; Hypertension in her other. There is no history of Arthritis. Allergies  Allergen Reactions  . Tussionex Pennkinetic Er [Hydrocod Polst-Cpm Polst Er] Hives     Review of Systems  Constitutional: Negative for fatigue and unexpected weight change.  Eyes: Negative for visual disturbance.  Respiratory: Negative for cough, chest tightness, shortness of breath and wheezing.   Cardiovascular:  Negative for chest pain, palpitations and leg swelling.  Endocrine: Negative for polydipsia and polyuria.  Neurological: Negative for dizziness, seizures, syncope, weakness, light-headedness and headaches.       Objective:   Physical Exam  Constitutional: She appears well-developed and well-nourished.  Cardiovascular: Normal rate and regular rhythm.   Pulmonary/Chest: Effort normal and breath sounds normal. No respiratory distress. She has no wheezes. She has no rales.  Abdominal: Soft. Bowel sounds are normal. She exhibits no distension and no mass. There is no tenderness. There is no rebound and no guarding.  No hepatomegaly.  Musculoskeletal: She exhibits no edema.          Assessment & Plan:  #1 peripheral edema resolved after reducing dose of amlodipine. Her hypertension remains adequately controlled #2 minimally elevated liver transaminase with ALT of 43. Rheumatologist requesting abdominal ultrasound. She has nonfocal exam and no concerning symptoms. Question fatty liver changes with recent weight gain #3 chronic insomnia. Sleep hygiene discussed. Refill temazepam for as needed use

## 2013-02-07 NOTE — Progress Notes (Signed)
Pre visit review using our clinic review tool, if applicable. No additional management support is needed unless otherwise documented below in the visit note. 

## 2013-02-13 DIAGNOSIS — M545 Low back pain: Secondary | ICD-10-CM | POA: Diagnosis not present

## 2013-02-15 ENCOUNTER — Other Ambulatory Visit: Payer: Self-pay | Admitting: Family Medicine

## 2013-02-17 ENCOUNTER — Ambulatory Visit (INDEPENDENT_AMBULATORY_CARE_PROVIDER_SITE_OTHER): Payer: Medicare Other | Admitting: Family Medicine

## 2013-02-17 ENCOUNTER — Encounter: Payer: Self-pay | Admitting: Family Medicine

## 2013-02-17 VITALS — BP 130/68 | HR 83 | Temp 98.6°F | Wt 160.0 lb

## 2013-02-17 DIAGNOSIS — H6590 Unspecified nonsuppurative otitis media, unspecified ear: Secondary | ICD-10-CM | POA: Diagnosis not present

## 2013-02-17 DIAGNOSIS — J069 Acute upper respiratory infection, unspecified: Secondary | ICD-10-CM | POA: Diagnosis not present

## 2013-02-17 DIAGNOSIS — H6592 Unspecified nonsuppurative otitis media, left ear: Secondary | ICD-10-CM

## 2013-02-17 MED ORDER — HYDROCODONE-ACETAMINOPHEN 10-325 MG PO TABS: 1.0000 | ORAL_TABLET | Freq: Four times a day (QID) | ORAL | Status: DC | PRN

## 2013-02-17 MED ORDER — AMOXICILLIN 875 MG PO TABS: 875.0000 mg | ORAL_TABLET | Freq: Two times a day (BID) | ORAL | Status: AC

## 2013-02-17 NOTE — Progress Notes (Signed)
Subjective:    Patient ID: Betty Jackson, female    DOB: [birth year], 78 y.o.   MRN: [id number]  HPI Acute visit for left ear ache. She developed sinus congestive symptoms, cough, body aches about 3-4 days ago. Left ear pain for 2 days. No drainage. No dizziness. Denies any fever or chills. She's had some slightly decreased hearing in the left ear over the past couple days. Cough is nonproductive. Denies any nausea or vomiting. She is having some severe left ear pains especially at night. Not relieved with Tylenol.  Past Medical History  Diagnosis Date  . Hypertension   . Hyperlipidemia   . Incontinence of urine   . CAD (coronary artery disease)     a. Stent RCA in [facility];  b. Cath approx 2009 - nonobs per pt report.  . Sinus bradycardia     a. on chronic bb  . PAF (paroxysmal atrial fibrillation)     a. lone epidode in 2011 according to notes.  . Depression   . Scoliosis   . Vocal cord dysfunction     "they don't operate properly" (10/28/2012)  . Diverticulosis   . Hemorrhoids   . Hiatal hernia   . Enteritis   . GERD (gastroesophageal reflux disease)   . Anxiety     "more so the last few days" (10/28/2012)  . Temporal arteritis 2011    a. followed @ [facility]; potential flareup 10/28/2012/notes 10/28/2012  . Exertional shortness of breath     "every now and then" (10/28/2012)  . Anemia     "one time" (10/28/2012)  . Migraines     "back in my 20's; none since" (10/28/2012)  . Arthritis      Right hemiarthroplasty after hip fracture 06/2011  . DDD (degenerative disc disease)   . Arthritis     "hands" (10/28/2012)  . Chronic lower back pain   . Anxiety state, unspecified 10/28/2012   Past Surgical History  Procedure Laterality Date  . Laparoscopic cholecystectomy  2001  . Tonsillectomy and adenoidectomy    . Abdominal hysterectomy  ~ 1984    TAH  . Bladder suspension    . Hemiarthroplasty hip  06/2011    fractured her hip and repaired in [place], [place]  . Colonoscopy  01/26/2012   Procedure: COLONOSCOPY;  Surgeon: Meryl Dare, MD,FACG;  Location: [facility] ENDOSCOPY;  Service: Endoscopy;  Laterality: N/A;  note the EGD is possible  . Esophagogastroduodenoscopy  01/26/2012    Procedure: ESOPHAGOGASTRODUODENOSCOPY (EGD);  Surgeon: Meryl Dare, MD,FACG;  Location: [facility] ENDOSCOPY;  Service: Endoscopy;  Laterality: N/A;  . Cataract extraction w/ intraocular lens implant Right ~ 08/2012  . Coronary angioplasty with stent placement  2006  . Back surgery    . Temporal artery biopsy / ligation Bilateral 2011  . Tubal ligation  ~ 1982  . X-stop implantation  ~ 2010    "lower back" (10/28/2012)    reports that she has never smoked. She has never used smokeless tobacco. She reports that she drinks about 0.6 ounces of alcohol per week. She reports that she does not use illicit drugs. family history includes Cancer in her daughter and mother; Heart attack in her father; Heart disease in her other; Hypertension in her other. There is no history of Arthritis. Allergies  Allergen Reactions  . Tussionex Pennkinetic Er [Hydrocod Polst-Cpm Polst Er] Hives      Review of Systems  Constitutional: Negative for fever and chills.  HENT: Positive for ear pain. Negative  for ear discharge.   Respiratory: Positive for cough.        Objective:   Physical Exam  Constitutional: She appears well-developed and well-nourished.  HENT:  Right Ear: External ear normal.  Mouth/Throat: Oropharynx is clear and moist. No oropharyngeal exudate.  Left eardrum reveals erythema and bulging but no visible perforation  Neck: Neck supple.  Cardiovascular: Normal rate.   Pulmonary/Chest: Effort normal and breath sounds normal. No respiratory distress. She has no wheezes. She has no rales.          Assessment & Plan:  Viral URI with left otitis media with effusion. Amoxicillin  875 mg twice daily for 10 days. Refer limited hydrocodone 10 mg one every 6 hours for severe pain #15 with no refill

## 2013-02-17 NOTE — Progress Notes (Signed)
Pre visit review using our clinic review tool, if applicable. No additional management support is needed unless otherwise documented below in the visit note. 

## 2013-02-17 NOTE — Patient Instructions (Signed)
Otitis Media with Effusion °Otitis media with effusion is the presence of fluid in the middle ear. This is a common problem that often follows ear infections. It may be present for weeks or longer after the infection. Unlike an acute ear infection, otits media with effusion refers only to fluid behind the ear drum and not infection. Children with repeated ear and sinus infections and allergy problems are the most likely to get otitis media with effusion. °CAUSES  °The most frequent cause of the fluid buildup is dysfunction of the eustacian tubes. These are the tubes that drain fluid in the ears to the throat. °SYMPTOMS  °· The main symptom of this condition is hearing loss. As a result, you or your child may: °· Listen to the TV at a loud volume. °· Not respond to questions. °· Ask "what" often when spoken to. °· There may be a sensation of fullness or pressure but usually not pain. °DIAGNOSIS  °· Your caregiver will diagnose this condition by examining you or your child's ears. °· Your caregiver may test the pressure in you or your child's ear with a tympanometer. °· A hearing test may be conducted if the problem persists. °· A caregiver will want to re-evaluate the condition periodically to see if it improves. °TREATMENT  °· Treatment depends on the duration and the effects of the effusion. °· Antibiotics, decongestants, nose drops, and cortisone-type drugs may not be helpful. °· Children with persistent ear effusions may have delayed language. Children at risk for developmental delays in hearing, learning, and speech may require referral to a specialist earlier than children not at risk. °· You or your child's caregiver may suggest a referral to an Ear, Nose, and Throat (ENT) surgeon for treatment. The following may help restore normal hearing: °· Drainage of fluid. °· Placement of ear tubes (tympanostomy tubes). °· Removal of adenoids (adenoidectomy). °HOME CARE INSTRUCTIONS  °· Avoid second hand  smoke. °· Infants who are breast fed are less likely to have this condition. °· Avoid feeding infants while laying flat. °· Avoid known environmental allergens. °· Be sure to see a caregiver or an ENT specialist for follow up. °· Avoid people who are sick. °SEEK MEDICAL CARE IF:  °· Hearing is not better in 3 months. °· Hearing is worse. °· Ear pain. °· Drainage from the ear. °· Dizziness. °Document Released: 04/23/2004 Document Revised: 06/08/2011 Document Reviewed: 10/11/2012 °ExitCare® Patient Information ©2014 [employer]. ° °

## 2013-02-20 ENCOUNTER — Other Ambulatory Visit: Payer: Self-pay | Admitting: Family Medicine

## 2013-02-20 ENCOUNTER — Encounter: Payer: Self-pay | Admitting: Family Medicine

## 2013-02-21 ENCOUNTER — Other Ambulatory Visit (INDEPENDENT_AMBULATORY_CARE_PROVIDER_SITE_OTHER): Payer: Medicare Other

## 2013-02-21 ENCOUNTER — Ambulatory Visit: Payer: Medicare Other | Attending: Specialist | Admitting: Physical Therapy

## 2013-02-21 DIAGNOSIS — M316 Other giant cell arteritis: Secondary | ICD-10-CM | POA: Diagnosis not present

## 2013-02-21 DIAGNOSIS — R5381 Other malaise: Secondary | ICD-10-CM | POA: Diagnosis not present

## 2013-02-21 DIAGNOSIS — M545 Low back pain, unspecified: Secondary | ICD-10-CM | POA: Insufficient documentation

## 2013-02-21 DIAGNOSIS — Z79899 Other long term (current) drug therapy: Secondary | ICD-10-CM | POA: Diagnosis not present

## 2013-02-21 DIAGNOSIS — IMO0001 Reserved for inherently not codable concepts without codable children: Secondary | ICD-10-CM | POA: Insufficient documentation

## 2013-02-21 LAB — SEDIMENTATION RATE: Sed Rate: 35 mm/hr — ABNORMAL HIGH (ref 0–22)

## 2013-02-27 ENCOUNTER — Ambulatory Visit: Payer: Medicare Other | Attending: Specialist | Admitting: Physical Therapy

## 2013-02-27 DIAGNOSIS — R5381 Other malaise: Secondary | ICD-10-CM | POA: Insufficient documentation

## 2013-02-27 DIAGNOSIS — M545 Low back pain, unspecified: Secondary | ICD-10-CM | POA: Insufficient documentation

## 2013-02-27 DIAGNOSIS — IMO0001 Reserved for inherently not codable concepts without codable children: Secondary | ICD-10-CM | POA: Insufficient documentation

## 2013-02-28 ENCOUNTER — Encounter: Payer: Self-pay | Admitting: Family Medicine

## 2013-02-28 ENCOUNTER — Ambulatory Visit (INDEPENDENT_AMBULATORY_CARE_PROVIDER_SITE_OTHER): Payer: Medicare Other | Admitting: Family Medicine

## 2013-02-28 VITALS — BP 126/68 | HR 64 | Temp 97.3°F | Wt 160.0 lb

## 2013-02-28 DIAGNOSIS — H66002 Acute suppurative otitis media without spontaneous rupture of ear drum, left ear: Secondary | ICD-10-CM

## 2013-02-28 DIAGNOSIS — H66009 Acute suppurative otitis media without spontaneous rupture of ear drum, unspecified ear: Secondary | ICD-10-CM

## 2013-02-28 NOTE — Progress Notes (Signed)
Subjective:    Patient ID: Betty Jackson, female    DOB: 1934-09-09, 78 y.o.   MRN: [id number]  HPI Followup regarding recent suppurative otitis media involving left ear. Patient was treated with amoxicillin and just finished as of yesterday. She still has some muffled hearing in the left ear and occasional popping sensation but no pain. Denies any fever chills. No drainage. No right ear symptoms. Denies any nasal congestion.  Past Medical History  Diagnosis Date  . Hypertension   . Hyperlipidemia   . Incontinence of urine   . CAD (coronary artery disease)     a. Stent RCA in [facility];  b. Cath approx 2009 - nonobs per pt report.  . Sinus bradycardia     a. on chronic bb  . PAF (paroxysmal atrial fibrillation)     a. lone epidode in 2011 according to notes.  . Depression   . Scoliosis   . Vocal cord dysfunction     "they don't operate properly" (10/28/2012)  . Diverticulosis   . Hemorrhoids   . Hiatal hernia   . Enteritis   . GERD (gastroesophageal reflux disease)   . Anxiety     "more so the last few days" (10/28/2012)  . Temporal arteritis 2011    a. followed @ [facility]; potential flareup 10/28/2012/notes 10/28/2012  . Exertional shortness of breath     "every now and then" (10/28/2012)  . Anemia     "one time" (10/28/2012)  . Migraines     "back in my 20's; none since" (10/28/2012)  . Arthritis      Right hemiarthroplasty after hip fracture 06/2011  . DDD (degenerative disc disease)   . Arthritis     "hands" (10/28/2012)  . Chronic lower back pain   . Anxiety state, unspecified 10/28/2012   Past Surgical History  Procedure Laterality Date  . Laparoscopic cholecystectomy  2001  . Tonsillectomy and adenoidectomy    . Abdominal hysterectomy  ~ 1984    TAH  . Bladder suspension    . Hemiarthroplasty hip  06/2011    fractured her hip and repaired in [place], [place]  . Colonoscopy  01/26/2012    Procedure: COLONOSCOPY;  Surgeon: Meryl Dare, MD,FACG;  Location: [facility] ENDOSCOPY;   Service: Endoscopy;  Laterality: N/A;  note the EGD is possible  . Esophagogastroduodenoscopy  01/26/2012    Procedure: ESOPHAGOGASTRODUODENOSCOPY (EGD);  Surgeon: Meryl Dare, MD,FACG;  Location: [facility] ENDOSCOPY;  Service: Endoscopy;  Laterality: N/A;  . Cataract extraction w/ intraocular lens implant Right ~ 08/2012  . Coronary angioplasty with stent placement  2006  . Back surgery    . Temporal artery biopsy / ligation Bilateral 2011  . Tubal ligation  ~ 1982  . X-stop implantation  ~ 2010    "lower back" (10/28/2012)    reports that she has never smoked. She has never used smokeless tobacco. She reports that she drinks about 0.6 ounces of alcohol per week. She reports that she does not use illicit drugs. family history includes Cancer in her daughter and mother; Heart attack in her father; Heart disease in her other; Hypertension in her other. There is no history of Arthritis. Allergies  Allergen Reactions  . Tussionex Pennkinetic Er [Hydrocod Polst-Cpm Polst Er] Hives       Review of Systems  Constitutional: Negative for fever and chills.  HENT: Positive for hearing loss. Negative for congestion, ear discharge and ear pain.   Respiratory: Negative for cough.  Objective:   Physical Exam  Constitutional: She appears well-developed and well-nourished.  HENT:  Right eardrum appears normal. Left eardrum is slightly retracted but not bulging and no significant erythema. She appears to have a little bit of crusted-type drainage on the surface near handle of malleus.   Neck: Neck supple.  Cardiovascular: Normal rate.   Pulmonary/Chest: Effort normal and breath sounds normal. No respiratory distress. She has no wheezes. She has no rales.  Lymphadenopathy:    She has no cervical adenopathy.          Assessment & Plan:  Left suppurative otitis which appears to be healing. Suspect she has some residual effusion. Observe for now. Touch base in 3-4 weeks if symptoms not fully  resolving

## 2013-02-28 NOTE — Progress Notes (Signed)
Pre visit review using our clinic review tool, if applicable. No additional management support is needed unless otherwise documented below in the visit note. 

## 2013-02-28 NOTE — Patient Instructions (Signed)
Follow up for any fever or increased left ear pain Also, touch base in 4-6 weeks if hearing not back to normal at that time.

## 2013-03-01 ENCOUNTER — Ambulatory Visit: Payer: Medicare Other | Admitting: Physical Therapy

## 2013-03-01 DIAGNOSIS — M4120 Other idiopathic scoliosis, site unspecified: Secondary | ICD-10-CM | POA: Diagnosis not present

## 2013-03-01 DIAGNOSIS — R5381 Other malaise: Secondary | ICD-10-CM | POA: Diagnosis not present

## 2013-03-01 DIAGNOSIS — M545 Low back pain: Secondary | ICD-10-CM | POA: Diagnosis not present

## 2013-03-01 DIAGNOSIS — M48061 Spinal stenosis, lumbar region without neurogenic claudication: Secondary | ICD-10-CM | POA: Diagnosis not present

## 2013-03-01 DIAGNOSIS — IMO0001 Reserved for inherently not codable concepts without codable children: Secondary | ICD-10-CM | POA: Diagnosis not present

## 2013-03-01 DIAGNOSIS — M5137 Other intervertebral disc degeneration, lumbosacral region: Secondary | ICD-10-CM | POA: Diagnosis not present

## 2013-03-02 DIAGNOSIS — M5430 Sciatica, unspecified side: Secondary | ICD-10-CM | POA: Diagnosis not present

## 2013-03-02 DIAGNOSIS — M999 Biomechanical lesion, unspecified: Secondary | ICD-10-CM | POA: Diagnosis not present

## 2013-03-06 ENCOUNTER — Ambulatory Visit: Payer: Medicare Other | Admitting: Physical Therapy

## 2013-03-06 DIAGNOSIS — M999 Biomechanical lesion, unspecified: Secondary | ICD-10-CM | POA: Diagnosis not present

## 2013-03-06 DIAGNOSIS — M5430 Sciatica, unspecified side: Secondary | ICD-10-CM | POA: Diagnosis not present

## 2013-03-07 ENCOUNTER — Other Ambulatory Visit (INDEPENDENT_AMBULATORY_CARE_PROVIDER_SITE_OTHER): Payer: Medicare Other

## 2013-03-07 DIAGNOSIS — Z79899 Other long term (current) drug therapy: Secondary | ICD-10-CM | POA: Diagnosis not present

## 2013-03-07 DIAGNOSIS — M5430 Sciatica, unspecified side: Secondary | ICD-10-CM | POA: Diagnosis not present

## 2013-03-07 DIAGNOSIS — M999 Biomechanical lesion, unspecified: Secondary | ICD-10-CM | POA: Diagnosis not present

## 2013-03-07 DIAGNOSIS — M316 Other giant cell arteritis: Secondary | ICD-10-CM | POA: Diagnosis not present

## 2013-03-08 ENCOUNTER — Ambulatory Visit: Payer: Medicare Other | Admitting: Physical Therapy

## 2013-03-08 DIAGNOSIS — IMO0001 Reserved for inherently not codable concepts without codable children: Secondary | ICD-10-CM | POA: Diagnosis not present

## 2013-03-08 DIAGNOSIS — M5430 Sciatica, unspecified side: Secondary | ICD-10-CM | POA: Diagnosis not present

## 2013-03-08 DIAGNOSIS — M999 Biomechanical lesion, unspecified: Secondary | ICD-10-CM | POA: Diagnosis not present

## 2013-03-08 DIAGNOSIS — M545 Low back pain: Secondary | ICD-10-CM | POA: Diagnosis not present

## 2013-03-08 DIAGNOSIS — R5381 Other malaise: Secondary | ICD-10-CM | POA: Diagnosis not present

## 2013-03-09 ENCOUNTER — Telehealth: Payer: Self-pay | Admitting: Family Medicine

## 2013-03-09 DIAGNOSIS — M5430 Sciatica, unspecified side: Secondary | ICD-10-CM | POA: Diagnosis not present

## 2013-03-09 DIAGNOSIS — M999 Biomechanical lesion, unspecified: Secondary | ICD-10-CM | POA: Diagnosis not present

## 2013-03-09 NOTE — Telephone Encounter (Signed)
Caller: Melissa/Child; Phone: [phone]; Reason for Call: Patient's pain specialist wants to do an epidural steroid injection in the back.  Instructed to call PCP because she will have to be off the Plavix for approximately 5-6 days prior to procedure.  Please contact daughter to give further instructions.

## 2013-03-10 ENCOUNTER — Telehealth: Payer: Self-pay | Admitting: Family Medicine

## 2013-03-10 NOTE — Telephone Encounter (Signed)
OK to hold for 5-6 days, but then needs to start back as soon as OK per ortho after her procedure.

## 2013-03-10 NOTE — Telephone Encounter (Signed)
Pt called and would like a call back about her labs.  

## 2013-03-10 NOTE — Telephone Encounter (Signed)
Pt informed

## 2013-03-10 NOTE — Telephone Encounter (Signed)
Pt daughter is informed.

## 2013-03-13 ENCOUNTER — Ambulatory Visit: Payer: Medicare Other | Admitting: Physical Therapy

## 2013-03-13 DIAGNOSIS — M545 Low back pain: Secondary | ICD-10-CM | POA: Diagnosis not present

## 2013-03-13 DIAGNOSIS — M999 Biomechanical lesion, unspecified: Secondary | ICD-10-CM | POA: Diagnosis not present

## 2013-03-13 DIAGNOSIS — M5430 Sciatica, unspecified side: Secondary | ICD-10-CM | POA: Diagnosis not present

## 2013-03-13 DIAGNOSIS — IMO0001 Reserved for inherently not codable concepts without codable children: Secondary | ICD-10-CM | POA: Diagnosis not present

## 2013-03-13 DIAGNOSIS — R5381 Other malaise: Secondary | ICD-10-CM | POA: Diagnosis not present

## 2013-03-14 DIAGNOSIS — M5430 Sciatica, unspecified side: Secondary | ICD-10-CM | POA: Diagnosis not present

## 2013-03-14 DIAGNOSIS — M999 Biomechanical lesion, unspecified: Secondary | ICD-10-CM | POA: Diagnosis not present

## 2013-03-15 ENCOUNTER — Ambulatory Visit: Payer: Medicare Other | Admitting: Physical Therapy

## 2013-03-16 DIAGNOSIS — M5430 Sciatica, unspecified side: Secondary | ICD-10-CM | POA: Diagnosis not present

## 2013-03-16 DIAGNOSIS — M999 Biomechanical lesion, unspecified: Secondary | ICD-10-CM | POA: Diagnosis not present

## 2013-03-17 DIAGNOSIS — M5137 Other intervertebral disc degeneration, lumbosacral region: Secondary | ICD-10-CM | POA: Diagnosis not present

## 2013-03-17 DIAGNOSIS — M48061 Spinal stenosis, lumbar region without neurogenic claudication: Secondary | ICD-10-CM | POA: Diagnosis not present

## 2013-03-17 DIAGNOSIS — M47817 Spondylosis without myelopathy or radiculopathy, lumbosacral region: Secondary | ICD-10-CM | POA: Diagnosis not present

## 2013-03-20 ENCOUNTER — Ambulatory Visit: Payer: Medicare Other | Admitting: Physical Therapy

## 2013-03-20 DIAGNOSIS — M999 Biomechanical lesion, unspecified: Secondary | ICD-10-CM | POA: Diagnosis not present

## 2013-03-20 DIAGNOSIS — M5430 Sciatica, unspecified side: Secondary | ICD-10-CM | POA: Diagnosis not present

## 2013-03-21 ENCOUNTER — Other Ambulatory Visit (INDEPENDENT_AMBULATORY_CARE_PROVIDER_SITE_OTHER): Payer: Medicare Other

## 2013-03-21 DIAGNOSIS — M316 Other giant cell arteritis: Secondary | ICD-10-CM

## 2013-03-21 DIAGNOSIS — Z79899 Other long term (current) drug therapy: Secondary | ICD-10-CM

## 2013-03-21 LAB — SEDIMENTATION RATE: Sed Rate: 20 mm/hr (ref 0–22)

## 2013-03-22 ENCOUNTER — Encounter: Payer: Medicare Other | Admitting: Physical Therapy

## 2013-03-22 DIAGNOSIS — M5430 Sciatica, unspecified side: Secondary | ICD-10-CM | POA: Diagnosis not present

## 2013-03-22 DIAGNOSIS — M999 Biomechanical lesion, unspecified: Secondary | ICD-10-CM | POA: Diagnosis not present

## 2013-03-23 ENCOUNTER — Other Ambulatory Visit: Payer: Self-pay | Admitting: Family Medicine

## 2013-03-27 ENCOUNTER — Telehealth: Payer: Self-pay | Admitting: Family Medicine

## 2013-03-27 ENCOUNTER — Ambulatory Visit: Payer: Medicare Other

## 2013-03-27 NOTE — Telephone Encounter (Signed)
Patient Information:  Caller Name: Betty Jackson  Phone: [phone]  Patient: Betty Jackson, Betty Jackson  Gender: Female  DOB: 07-Sep-1934  Age: 78 Years  PCP: Evelena Peat (Family Practice)  Office Follow Up:  Does the office need to follow up with this patient?: N/A  Instructions For The Office: N/A   Symptoms  Reason For Call & Symptoms: Betty Jackson states she has already spoken with someone for Betty Jackson at [facility]. States she has already given extensive information regarding Betty Jackson. No notes seen in EPIC for 12/29. States her mother received a Cortisone injection in her back on 12/19. States Betty Jackson is having severe back pain. Betty Jackson talked Orthopedic Provider today 12/29  who advised Betty Jackson to take Betty Jackson to ED. Betty Jackson requesting a callback from Betty Jackson to [phone] regarding admission to hospital.  Reviewed Health History In EMR: No  Reviewed Medications In EMR: No  Reviewed Allergies In EMR: Yes  Reviewed Surgeries / Procedures: Yes  Date of Onset of Symptoms: Unknown  Guideline(s) Used:  No Protocol Available - Sick Adult  Disposition Per Guideline:   Discuss with PCP and Callback by Nurse Today  Reason For Disposition Reached:   Nursing judgment  Advice Given:  N/A  Patient Will Follow Care Advice:  YES

## 2013-03-27 NOTE — Telephone Encounter (Signed)
Spoke with daughter.  Pt has severe lumbar degenerative disease managed by ortho.  Recent epidural without improvement.  They wrote for Percocet today. Daughter will try that and she is asking about possible hospital admission for pain control if that is not working.  Her mom has become progressively less mobile past few weeks.

## 2013-03-27 NOTE — Telephone Encounter (Signed)
Spoke with pt's daughter Betty Jackson), who would really like a call back from Dr. Caryl Never in regards to her moms overall health.  Pt was recently seen by Orthopedic provider who has upped her pain meds to oxycontin, pt's daughter would like an opinion on this.  Also, daughter did not take pt to hospital as advised to do by Orthopedic Provider. Please advise.

## 2013-03-29 ENCOUNTER — Ambulatory Visit: Payer: Medicare Other | Admitting: Physical Therapy

## 2013-03-30 HISTORY — PX: LUMBAR FUSION: SHX111

## 2013-03-31 DIAGNOSIS — M48061 Spinal stenosis, lumbar region without neurogenic claudication: Secondary | ICD-10-CM | POA: Diagnosis not present

## 2013-04-01 ENCOUNTER — Other Ambulatory Visit: Payer: Self-pay | Admitting: Family Medicine

## 2013-04-03 NOTE — Telephone Encounter (Signed)
Last refill 02/07/13 #60 0 refill Last visit 02/28/13

## 2013-04-03 NOTE — Telephone Encounter (Signed)
Make sure she knows not to take alprazolam with this. May refill for 3 months.

## 2013-04-04 ENCOUNTER — Other Ambulatory Visit (INDEPENDENT_AMBULATORY_CARE_PROVIDER_SITE_OTHER): Payer: Medicare Other

## 2013-04-04 DIAGNOSIS — M316 Other giant cell arteritis: Secondary | ICD-10-CM

## 2013-04-04 DIAGNOSIS — Z79899 Other long term (current) drug therapy: Secondary | ICD-10-CM

## 2013-04-04 DIAGNOSIS — I2510 Atherosclerotic heart disease of native coronary artery without angina pectoris: Secondary | ICD-10-CM

## 2013-04-04 LAB — HIGH SENSITIVITY CRP: CRP, High Sensitivity: 0.24 mg/L (ref 0.000–5.000)

## 2013-04-04 LAB — SEDIMENTATION RATE: Sed Rate: 10 mm/hr (ref 0–22)

## 2013-04-05 DIAGNOSIS — M4120 Other idiopathic scoliosis, site unspecified: Secondary | ICD-10-CM | POA: Diagnosis not present

## 2013-04-05 DIAGNOSIS — M48061 Spinal stenosis, lumbar region without neurogenic claudication: Secondary | ICD-10-CM | POA: Diagnosis not present

## 2013-04-06 DIAGNOSIS — M4120 Other idiopathic scoliosis, site unspecified: Secondary | ICD-10-CM | POA: Diagnosis not present

## 2013-04-06 DIAGNOSIS — E669 Obesity, unspecified: Secondary | ICD-10-CM | POA: Diagnosis not present

## 2013-04-07 ENCOUNTER — Other Ambulatory Visit: Payer: Self-pay | Admitting: Neurological Surgery

## 2013-04-10 ENCOUNTER — Encounter (HOSPITAL_COMMUNITY): Payer: Self-pay

## 2013-04-11 ENCOUNTER — Telehealth: Payer: Self-pay | Admitting: *Deleted

## 2013-04-11 NOTE — Telephone Encounter (Signed)
Spoke with pt. She reports she is doing well from a heart standpoint. Did have some swelling in feet after amlodipine increased. Dose was changed back to 5 mg daily by primary MD and swelling resolved. Pt reports blood pressure readings have been good.  I told her Dr. Angelena Form would clear her for surgery and we would send paperwork to Dr. Clarice Pole office.

## 2013-04-11 NOTE — Telephone Encounter (Signed)
[place], thanks. cdm

## 2013-04-11 NOTE — Telephone Encounter (Signed)
Completed paperwork given to medical records to fax. 

## 2013-04-11 NOTE — Telephone Encounter (Signed)
Received fax from Dr. Ellene Route requesting cardiac clearance for surgery. Reviewed with Dr. Angelena Form and pt can be cleared if she is feeling well from a cardiac standpoint.  I placed call to pt to see how she is doing. Left message to call back.

## 2013-04-11 NOTE — Telephone Encounter (Signed)
Follow  Up    Returned a nurses call

## 2013-04-14 ENCOUNTER — Telehealth: Payer: Self-pay | Admitting: Family Medicine

## 2013-04-14 ENCOUNTER — Other Ambulatory Visit: Payer: Self-pay | Admitting: Family Medicine

## 2013-04-14 DIAGNOSIS — Z780 Asymptomatic menopausal state: Secondary | ICD-10-CM

## 2013-04-14 NOTE — Telephone Encounter (Signed)
OK to order DEXA scan.  Make sure she has not had in past 2 years.

## 2013-04-14 NOTE — Telephone Encounter (Signed)
Order is placed.

## 2013-04-14 NOTE — Telephone Encounter (Signed)
Pt daughter is calling requesting a order for her mother to have bone density test prior to back surgery w/dr elsner on 04-25-2012.

## 2013-04-18 ENCOUNTER — Encounter (HOSPITAL_COMMUNITY): Payer: Self-pay

## 2013-04-18 ENCOUNTER — Encounter (HOSPITAL_COMMUNITY)
Admission: RE | Admit: 2013-04-18 | Discharge: 2013-04-18 | Disposition: A | Discharge status: Home / Self Care | Payer: Medicare Other | Source: Ambulatory Visit | Attending: Neurological Surgery | Admitting: Neurological Surgery

## 2013-04-18 ENCOUNTER — Telehealth: Payer: Self-pay | Admitting: Family Medicine

## 2013-04-18 ENCOUNTER — Other Ambulatory Visit (INDEPENDENT_AMBULATORY_CARE_PROVIDER_SITE_OTHER): Payer: Medicare Other

## 2013-04-18 DIAGNOSIS — I10 Essential (primary) hypertension: Secondary | ICD-10-CM | POA: Diagnosis not present

## 2013-04-18 DIAGNOSIS — Z01812 Encounter for preprocedural laboratory examination: Secondary | ICD-10-CM | POA: Insufficient documentation

## 2013-04-18 DIAGNOSIS — Z01818 Encounter for other preprocedural examination: Secondary | ICD-10-CM | POA: Insufficient documentation

## 2013-04-18 DIAGNOSIS — Z79899 Other long term (current) drug therapy: Secondary | ICD-10-CM | POA: Diagnosis not present

## 2013-04-18 DIAGNOSIS — M316 Other giant cell arteritis: Secondary | ICD-10-CM | POA: Diagnosis not present

## 2013-04-18 HISTORY — DX: Insomnia, unspecified: G47.00

## 2013-04-18 HISTORY — DX: Adverse effect of unspecified anesthetic, initial encounter: T41.45XA

## 2013-04-18 HISTORY — DX: Other giant cell arteritis: M31.6

## 2013-04-18 HISTORY — DX: Age-related osteoporosis without current pathological fracture: M81.0

## 2013-04-18 HISTORY — DX: Diverticulitis of intestine, part unspecified, without perforation or abscess without bleeding: K57.92

## 2013-04-18 HISTORY — DX: Other skin changes: R23.8

## 2013-04-18 HISTORY — DX: Unspecified cataract: H26.9

## 2013-04-18 HISTORY — DX: Personal history of other infectious and parasitic diseases: Z86.19

## 2013-04-18 LAB — BASIC METABOLIC PANEL
BUN: 14 mg/dL (ref 6–23)
CHLORIDE: 90 meq/L — AB (ref 96–112)
CO2: 28 mEq/L (ref 19–32)
CREATININE: 0.74 mg/dL (ref 0.50–1.10)
Calcium: 10 mg/dL (ref 8.4–10.5)
GFR calc non Af Amer: 81 mL/min — ABNORMAL LOW (ref >90)
GLUCOSE: 105 mg/dL — AB (ref 70–99)
POTASSIUM: 3.8 meq/L (ref 3.7–5.3)
Sodium: 133 mEq/L — ABNORMAL LOW (ref 137–147)

## 2013-04-18 LAB — CBC
HEMATOCRIT: 38.7 % (ref 36.0–46.0)
HEMOGLOBIN: 13.6 g/dL (ref 12.0–15.0)
MCH: 31.9 pg (ref 26.0–34.0)
MCHC: 35.1 g/dL (ref 30.0–36.0)
MCV: 90.6 fL (ref 78.0–100.0)
Platelets: 185 10*3/uL (ref 150–400)
RBC: 4.27 MIL/uL (ref 3.87–5.11)
RDW: 13 % (ref 11.5–15.5)
WBC: 10.8 10*3/uL — ABNORMAL HIGH (ref 4.0–10.5)

## 2013-04-18 LAB — SURGICAL PCR SCREEN
MRSA, PCR: NEGATIVE
STAPHYLOCOCCUS AUREUS: NEGATIVE

## 2013-04-18 LAB — HIGH SENSITIVITY CRP: CRP HIGH SENSITIVITY: 0.28 mg/L (ref 0.000–5.000)

## 2013-04-18 LAB — SEDIMENTATION RATE: Sed Rate: 6 mm/hr (ref 0–22)

## 2013-04-18 NOTE — Progress Notes (Addendum)
Cardiologist is DR.Parnell office visit in epic  Echo report in epic from 2014  Heart cath report in epic from 11-2012  EKG in epic from 12-01-12  Dr.Bruce Burchette is Medical Md  Stress test done about 3+yrs ago  CXR to be requested from [place] in [place]

## 2013-04-18 NOTE — Pre-Procedure Instructions (Signed)
Betty Jackson  04/18/2013   Your procedure is scheduled on:  Tues, Jan 27 @ 12:20 PM  Report to [facility]  at 9:15 AM.  Call this number if you have problems the morning of surgery: [phone]   Remember:   Do not eat food or drink liquids after midnight.   Take these medicines the morning of surgery with A SIP OF WATER: Amlodipine(Norvasc),Gabapentin(Neurontin),Prednisone(Deltasone),and Metoprolol(Lopressor)   Do not wear jewelry, make-up or nail polish.  Do not wear lotions, powders, or perfumes. You may wear deodorant.  Do not shave 48 hours prior to surgery.   Do not bring valuables to the hospital.  [facility] is not responsible                  for any belongings or valuables.               Contacts, dentures or bridgework may not be worn into surgery.  Leave suitcase in the car. After surgery it may be brought to your room.  For patients admitted to the hospital, discharge time is determined by your                treatment team.               Special Instructions: Shower using CHG 2 nights before surgery and the night before surgery.  If you shower the day of surgery use CHG.  Use special wash - you have one bottle of CHG for all showers.  You should use approximately 1/3 of the bottle for each shower.   Please read over the following fact sheets that you were given: Pain Booklet, Coughing and Deep Breathing, Blood Transfusion Information, MRSA Information and Surgical Site Infection Prevention

## 2013-04-20 LAB — TYPE AND SCREEN
ABO/RH(D): O POS
Antibody Screen: NEGATIVE

## 2013-04-24 ENCOUNTER — Encounter (HOSPITAL_COMMUNITY): Payer: Self-pay | Admitting: Certified Registered Nurse Anesthetist

## 2013-04-24 MED ORDER — CEFAZOLIN SODIUM-DEXTROSE 2-3 GM-% IV SOLR
2.0000 g | INTRAVENOUS | Status: AC
  Administered 2013-04-25 (×2): 2 g via INTRAVENOUS
  Filled 2013-04-24: qty 50

## 2013-04-25 ENCOUNTER — Inpatient Hospital Stay (HOSPITAL_COMMUNITY): Payer: Medicare Other

## 2013-04-25 ENCOUNTER — Encounter (HOSPITAL_COMMUNITY): Payer: Medicare Other | Admitting: Certified Registered Nurse Anesthetist

## 2013-04-25 ENCOUNTER — Encounter (HOSPITAL_COMMUNITY): Payer: Self-pay | Admitting: *Deleted

## 2013-04-25 ENCOUNTER — Inpatient Hospital Stay (HOSPITAL_COMMUNITY)
Admission: RE | Admit: 2013-04-25 | Discharge: 2013-05-01 | DRG: 460 | Disposition: A | Discharge status: Home Health | Payer: Medicare Other | Source: Ambulatory Visit | Attending: Neurological Surgery | Admitting: Neurological Surgery

## 2013-04-25 ENCOUNTER — Inpatient Hospital Stay (HOSPITAL_COMMUNITY): Payer: Medicare Other | Admitting: Certified Registered Nurse Anesthetist

## 2013-04-25 ENCOUNTER — Encounter (HOSPITAL_COMMUNITY)
Admission: RE | Disposition: A | Discharge status: Home Health | Payer: Medicare Other | Source: Ambulatory Visit | Attending: Neurological Surgery

## 2013-04-25 DIAGNOSIS — R55 Syncope and collapse: Secondary | ICD-10-CM | POA: Diagnosis not present

## 2013-04-25 DIAGNOSIS — M48061 Spinal stenosis, lumbar region without neurogenic claudication: Principal | ICD-10-CM | POA: Diagnosis present

## 2013-04-25 DIAGNOSIS — M4120 Other idiopathic scoliosis, site unspecified: Secondary | ICD-10-CM | POA: Diagnosis present

## 2013-04-25 DIAGNOSIS — D62 Acute posthemorrhagic anemia: Secondary | ICD-10-CM | POA: Diagnosis not present

## 2013-04-25 DIAGNOSIS — M316 Other giant cell arteritis: Secondary | ICD-10-CM | POA: Diagnosis present

## 2013-04-25 DIAGNOSIS — Z9861 Coronary angioplasty status: Secondary | ICD-10-CM

## 2013-04-25 DIAGNOSIS — M519 Unspecified thoracic, thoracolumbar and lumbosacral intervertebral disc disorder: Secondary | ICD-10-CM | POA: Diagnosis not present

## 2013-04-25 DIAGNOSIS — G834 Cauda equina syndrome: Secondary | ICD-10-CM | POA: Diagnosis present

## 2013-04-25 DIAGNOSIS — IMO0002 Reserved for concepts with insufficient information to code with codable children: Secondary | ICD-10-CM

## 2013-04-25 DIAGNOSIS — M79609 Pain in unspecified limb: Secondary | ICD-10-CM | POA: Diagnosis not present

## 2013-04-25 DIAGNOSIS — I2510 Atherosclerotic heart disease of native coronary artery without angina pectoris: Secondary | ICD-10-CM | POA: Diagnosis not present

## 2013-04-25 DIAGNOSIS — I471 Supraventricular tachycardia: Secondary | ICD-10-CM | POA: Diagnosis not present

## 2013-04-25 HISTORY — DX: Spinal stenosis, lumbar region without neurogenic claudication: M48.061

## 2013-04-25 SURGERY — POSTERIOR LUMBAR FUSION 2 LEVEL
Anesthesia: General | Site: Back

## 2013-04-25 MED ORDER — LIDOCAINE HCL (CARDIAC) 20 MG/ML IV SOLN
INTRAVENOUS | Status: AC
  Filled 2013-04-25: qty 5

## 2013-04-25 MED ORDER — PHENOL 1.4 % MT LIQD: 1.0000 | OROMUCOSAL | Status: DC | PRN

## 2013-04-25 MED ORDER — POLYETHYLENE GLYCOL 3350 17 G PO PACK
17.0000 g | PACK | Freq: Every day | ORAL | Status: DC | PRN
  Filled 2013-04-25: qty 1

## 2013-04-25 MED ORDER — MIDAZOLAM HCL 5 MG/5ML IJ SOLN
INTRAMUSCULAR | Status: DC | PRN
  Administered 2013-04-25: 2 mg via INTRAVENOUS

## 2013-04-25 MED ORDER — METOPROLOL TARTRATE 25 MG PO TABS
25.0000 mg | ORAL_TABLET | Freq: Two times a day (BID) | ORAL | Status: DC
  Administered 2013-04-25 – 2013-05-01 (×10): 25 mg via ORAL
  Filled 2013-04-25 (×13): qty 1

## 2013-04-25 MED ORDER — METHOCARBAMOL 100 MG/ML IJ SOLN
500.0000 mg | Freq: Four times a day (QID) | INTRAVENOUS | Status: DC | PRN
  Filled 2013-04-25: qty 5

## 2013-04-25 MED ORDER — OXYCODONE-ACETAMINOPHEN 5-325 MG PO TABS
1.0000 | ORAL_TABLET | Freq: Once | ORAL | Status: AC
  Administered 2013-04-25: 1 via ORAL
  Filled 2013-04-25: qty 1

## 2013-04-25 MED ORDER — AMLODIPINE BESYLATE 5 MG PO TABS
5.0000 mg | ORAL_TABLET | Freq: Every day | ORAL | Status: DC
  Administered 2013-04-26 – 2013-05-01 (×4): 5 mg via ORAL
  Filled 2013-04-25 (×6): qty 1

## 2013-04-25 MED ORDER — ONDANSETRON HCL 4 MG/2ML IJ SOLN
INTRAMUSCULAR | Status: DC | PRN
  Administered 2013-04-25: 4 mg via INTRAVENOUS

## 2013-04-25 MED ORDER — ONDANSETRON HCL 4 MG/2ML IJ SOLN
4.0000 mg | INTRAMUSCULAR | Status: DC | PRN
  Administered 2013-04-26 – 2013-04-30 (×4): 4 mg via INTRAVENOUS
  Filled 2013-04-25 (×4): qty 2

## 2013-04-25 MED ORDER — ALBUMIN HUMAN 5 % IV SOLN
INTRAVENOUS | Status: DC | PRN
  Administered 2013-04-25: 20:00:00 via INTRAVENOUS

## 2013-04-25 MED ORDER — OXYCODONE HCL 5 MG PO TABS: 5.0000 mg | ORAL_TABLET | Freq: Once | ORAL | Status: DC | PRN

## 2013-04-25 MED ORDER — MENTHOL 3 MG MT LOZG: 1.0000 | LOZENGE | OROMUCOSAL | Status: DC | PRN

## 2013-04-25 MED ORDER — ROCURONIUM BROMIDE 50 MG/5ML IV SOLN
INTRAVENOUS | Status: AC
  Filled 2013-04-25: qty 1

## 2013-04-25 MED ORDER — HYDROCHLOROTHIAZIDE 12.5 MG PO CAPS
12.5000 mg | ORAL_CAPSULE | Freq: Every day | ORAL | Status: DC
  Administered 2013-04-26 – 2013-05-01 (×4): 12.5 mg via ORAL
  Filled 2013-04-25 (×6): qty 1

## 2013-04-25 MED ORDER — GLYCOPYRROLATE 0.2 MG/ML IJ SOLN
INTRAMUSCULAR | Status: AC
  Filled 2013-04-25: qty 3

## 2013-04-25 MED ORDER — ACETAMINOPHEN 650 MG RE SUPP: 650.0000 mg | RECTAL | Status: DC | PRN

## 2013-04-25 MED ORDER — LIDOCAINE HCL (CARDIAC) 20 MG/ML IV SOLN
INTRAVENOUS | Status: DC | PRN
  Administered 2013-04-25: 50 mg via INTRAVENOUS

## 2013-04-25 MED ORDER — PROPOFOL 10 MG/ML IV BOLUS
INTRAVENOUS | Status: DC | PRN
  Administered 2013-04-25: 120 mg via INTRAVENOUS

## 2013-04-25 MED ORDER — PREDNISONE 2.5 MG PO TABS
12.5000 mg | ORAL_TABLET | Freq: Every day | ORAL | Status: DC
  Administered 2013-04-26 – 2013-05-01 (×6): 12.5 mg via ORAL
  Filled 2013-04-25: qty 1
  Filled 2013-04-25 (×3): qty 5
  Filled 2013-04-25 (×3): qty 1

## 2013-04-25 MED ORDER — FENTANYL CITRATE 0.05 MG/ML IJ SOLN
INTRAMUSCULAR | Status: AC
  Filled 2013-04-25: qty 5

## 2013-04-25 MED ORDER — LACTATED RINGERS IV SOLN
INTRAVENOUS | Status: DC | PRN
  Administered 2013-04-25 (×3): via INTRAVENOUS

## 2013-04-25 MED ORDER — GLYCOPYRROLATE 0.2 MG/ML IJ SOLN
INTRAMUSCULAR | Status: AC
  Filled 2013-04-25: qty 4

## 2013-04-25 MED ORDER — PHENYLEPHRINE HCL 10 MG/ML IJ SOLN
INTRAMUSCULAR | Status: DC | PRN
  Administered 2013-04-25 (×3): 40 ug via INTRAVENOUS

## 2013-04-25 MED ORDER — OXYCODONE-ACETAMINOPHEN 10-325 MG PO TABS: 1.0000 | ORAL_TABLET | ORAL | Status: DC | PRN

## 2013-04-25 MED ORDER — OXYCODONE HCL 5 MG/5ML PO SOLN: 5.0000 mg | Freq: Once | ORAL | Status: DC | PRN

## 2013-04-25 MED ORDER — SODIUM CHLORIDE 0.9 % IV SOLN: 250.0000 mL | INTRAVENOUS | Status: DC

## 2013-04-25 MED ORDER — SURGIFOAM 100 EX MISC
CUTANEOUS | Status: DC | PRN
  Administered 2013-04-25: 16:00:00 via TOPICAL

## 2013-04-25 MED ORDER — PHENYLEPHRINE 40 MCG/ML (10ML) SYRINGE FOR IV PUSH (FOR BLOOD PRESSURE SUPPORT)
PREFILLED_SYRINGE | INTRAVENOUS | Status: AC
  Filled 2013-04-25: qty 10

## 2013-04-25 MED ORDER — MEPERIDINE HCL 25 MG/ML IJ SOLN: 6.2500 mg | INTRAMUSCULAR | Status: DC | PRN

## 2013-04-25 MED ORDER — FENTANYL CITRATE 0.05 MG/ML IJ SOLN
INTRAMUSCULAR | Status: DC | PRN
  Administered 2013-04-25 (×3): 50 ug via INTRAVENOUS
  Administered 2013-04-25: 100 ug via INTRAVENOUS
  Administered 2013-04-25 (×2): 50 ug via INTRAVENOUS
  Administered 2013-04-25: 100 ug via INTRAVENOUS
  Administered 2013-04-25: 50 ug via INTRAVENOUS

## 2013-04-25 MED ORDER — BUPIVACAINE HCL (PF) 0.5 % IJ SOLN
INTRAMUSCULAR | Status: DC | PRN
  Administered 2013-04-25: 20 mL
  Administered 2013-04-25: 5 mL

## 2013-04-25 MED ORDER — BISACODYL 10 MG RE SUPP
10.0000 mg | Freq: Every day | RECTAL | Status: DC | PRN
  Administered 2013-04-28: 10 mg via RECTAL
  Filled 2013-04-25 (×2): qty 1

## 2013-04-25 MED ORDER — GLYCOPYRROLATE 0.2 MG/ML IJ SOLN
INTRAMUSCULAR | Status: DC | PRN
  Administered 2013-04-25: 0.6 mg via INTRAVENOUS

## 2013-04-25 MED ORDER — GABAPENTIN 300 MG PO CAPS
300.0000 mg | ORAL_CAPSULE | Freq: Four times a day (QID) | ORAL | Status: DC
  Administered 2013-04-25 – 2013-05-01 (×21): 300 mg via ORAL
  Filled 2013-04-25 (×25): qty 1

## 2013-04-25 MED ORDER — ALUM & MAG HYDROXIDE-SIMETH 200-200-20 MG/5ML PO SUSP: 30.0000 mL | Freq: Four times a day (QID) | ORAL | Status: DC | PRN

## 2013-04-25 MED ORDER — CEFAZOLIN SODIUM 1-5 GM-% IV SOLN
1.0000 g | Freq: Three times a day (TID) | INTRAVENOUS | Status: AC
  Administered 2013-04-25 – 2013-04-26 (×2): 1 g via INTRAVENOUS
  Filled 2013-04-25 (×2): qty 50

## 2013-04-25 MED ORDER — HYDROCORTISONE SOD SUCCINATE 100 MG IJ SOLR
INTRAMUSCULAR | Status: AC
  Filled 2013-04-25: qty 2

## 2013-04-25 MED ORDER — MIDAZOLAM HCL 2 MG/2ML IJ SOLN
INTRAMUSCULAR | Status: AC
  Filled 2013-04-25: qty 2

## 2013-04-25 MED ORDER — HYDROMORPHONE HCL PF 1 MG/ML IJ SOLN
INTRAMUSCULAR | Status: AC
  Administered 2013-04-25: 0.5 mg via INTRAVENOUS
  Filled 2013-04-25: qty 1

## 2013-04-25 MED ORDER — METHOCARBAMOL 500 MG PO TABS
ORAL_TABLET | ORAL | Status: AC
  Filled 2013-04-25: qty 1

## 2013-04-25 MED ORDER — NEOSTIGMINE METHYLSULFATE 1 MG/ML IJ SOLN
INTRAMUSCULAR | Status: AC
  Filled 2013-04-25: qty 10

## 2013-04-25 MED ORDER — SODIUM CHLORIDE 0.9 % IJ SOLN: 3.0000 mL | INTRAMUSCULAR | Status: DC | PRN

## 2013-04-25 MED ORDER — FENTANYL CITRATE 0.05 MG/ML IJ SOLN
INTRAMUSCULAR | Status: AC
Start: 2013-04-25 — End: 2013-04-25
  Filled 2013-04-25: qty 5

## 2013-04-25 MED ORDER — INSULIN ASPART 100 UNIT/ML ~~LOC~~ SOLN
0.0000 [IU] | Freq: Three times a day (TID) | SUBCUTANEOUS | Status: DC
  Administered 2013-04-26 – 2013-04-29 (×6): 3 [IU] via SUBCUTANEOUS
  Administered 2013-04-30: 4 [IU] via SUBCUTANEOUS

## 2013-04-25 MED ORDER — HYDROMORPHONE HCL PF 1 MG/ML IJ SOLN
0.2500 mg | INTRAMUSCULAR | Status: DC | PRN
  Administered 2013-04-25 (×4): 0.5 mg via INTRAVENOUS

## 2013-04-25 MED ORDER — DOCUSATE SODIUM 100 MG PO CAPS
100.0000 mg | ORAL_CAPSULE | Freq: Two times a day (BID) | ORAL | Status: DC
  Administered 2013-04-25 – 2013-05-01 (×12): 100 mg via ORAL
  Filled 2013-04-25 (×10): qty 1

## 2013-04-25 MED ORDER — MORPHINE SULFATE 4 MG/ML IJ SOLN
INTRAMUSCULAR | Status: AC
  Administered 2013-04-25: 4 mg
  Filled 2013-04-25: qty 1

## 2013-04-25 MED ORDER — OXYCODONE-ACETAMINOPHEN 5-325 MG PO TABS
ORAL_TABLET | ORAL | Status: AC
  Filled 2013-04-25: qty 2

## 2013-04-25 MED ORDER — METHOCARBAMOL 500 MG PO TABS
500.0000 mg | ORAL_TABLET | Freq: Four times a day (QID) | ORAL | Status: DC | PRN
  Administered 2013-04-25 – 2013-04-27 (×2): 500 mg via ORAL
  Filled 2013-04-25 (×3): qty 1

## 2013-04-25 MED ORDER — LIDOCAINE-EPINEPHRINE 1 %-1:100000 IJ SOLN
INTRAMUSCULAR | Status: DC | PRN
  Administered 2013-04-25: 5 mL via INTRADERMAL

## 2013-04-25 MED ORDER — EPHEDRINE SULFATE 50 MG/ML IJ SOLN
INTRAMUSCULAR | Status: DC | PRN
  Administered 2013-04-25 (×2): 5 mg via INTRAVENOUS

## 2013-04-25 MED ORDER — SODIUM CHLORIDE 0.9 % IJ SOLN
INTRAMUSCULAR | Status: AC
  Filled 2013-04-25: qty 10

## 2013-04-25 MED ORDER — SODIUM CHLORIDE 0.9 % IV SOLN
INTRAVENOUS | Status: DC
  Administered 2013-04-25: 23:00:00 via INTRAVENOUS

## 2013-04-25 MED ORDER — SENNA 8.6 MG PO TABS
1.0000 | ORAL_TABLET | Freq: Two times a day (BID) | ORAL | Status: DC
  Administered 2013-04-25 – 2013-05-01 (×12): 8.6 mg via ORAL
  Filled 2013-04-25 (×14): qty 1

## 2013-04-25 MED ORDER — LOSARTAN POTASSIUM 50 MG PO TABS
100.0000 mg | ORAL_TABLET | Freq: Every day | ORAL | Status: DC
  Administered 2013-04-26 – 2013-04-28 (×2): 100 mg via ORAL
  Filled 2013-04-25 (×6): qty 2

## 2013-04-25 MED ORDER — ONDANSETRON HCL 4 MG/2ML IJ SOLN: 4.0000 mg | Freq: Once | INTRAMUSCULAR | Status: DC | PRN

## 2013-04-25 MED ORDER — NEOSTIGMINE METHYLSULFATE 1 MG/ML IJ SOLN
INTRAMUSCULAR | Status: DC | PRN
  Administered 2013-04-25: 4 mg via INTRAVENOUS

## 2013-04-25 MED ORDER — LACTATED RINGERS IV SOLN
INTRAVENOUS | Status: DC
  Administered 2013-04-25: 10:00:00 via INTRAVENOUS

## 2013-04-25 MED ORDER — PROPOFOL 10 MG/ML IV BOLUS
INTRAVENOUS | Status: AC
  Filled 2013-04-25: qty 20

## 2013-04-25 MED ORDER — 0.9 % SODIUM CHLORIDE (POUR BTL) OPTIME
TOPICAL | Status: DC | PRN
  Administered 2013-04-25: 1000 mL

## 2013-04-25 MED ORDER — ACETAMINOPHEN 325 MG PO TABS
650.0000 mg | ORAL_TABLET | ORAL | Status: DC | PRN
  Administered 2013-04-26: 650 mg via ORAL
  Filled 2013-04-25: qty 2

## 2013-04-25 MED ORDER — ATORVASTATIN CALCIUM 20 MG PO TABS
20.0000 mg | ORAL_TABLET | Freq: Every day | ORAL | Status: DC
  Administered 2013-04-26 – 2013-05-01 (×5): 20 mg via ORAL
  Filled 2013-04-25 (×6): qty 1

## 2013-04-25 MED ORDER — MORPHINE SULFATE 2 MG/ML IJ SOLN
1.0000 mg | INTRAMUSCULAR | Status: DC | PRN
  Filled 2013-04-25: qty 2

## 2013-04-25 MED ORDER — VECURONIUM BROMIDE 10 MG IV SOLR
INTRAVENOUS | Status: DC | PRN
  Administered 2013-04-25: 2 mg via INTRAVENOUS

## 2013-04-25 MED ORDER — ONDANSETRON HCL 4 MG/2ML IJ SOLN
INTRAMUSCULAR | Status: AC
  Filled 2013-04-25: qty 2

## 2013-04-25 MED ORDER — CEFAZOLIN SODIUM-DEXTROSE 2-3 GM-% IV SOLR
INTRAVENOUS | Status: AC
  Filled 2013-04-25: qty 50

## 2013-04-25 MED ORDER — FLEET ENEMA 7-19 GM/118ML RE ENEM
1.0000 | ENEMA | Freq: Once | RECTAL | Status: AC | PRN
  Filled 2013-04-25: qty 1

## 2013-04-25 MED ORDER — OXYCODONE-ACETAMINOPHEN 5-325 MG PO TABS
1.0000 | ORAL_TABLET | ORAL | Status: DC | PRN
  Administered 2013-04-25: 2 via ORAL
  Administered 2013-04-26: 1 via ORAL
  Administered 2013-04-26: 2 via ORAL
  Administered 2013-04-27: 1 via ORAL
  Administered 2013-04-27 – 2013-05-01 (×16): 2 via ORAL
  Filled 2013-04-25 (×3): qty 2
  Filled 2013-04-25: qty 1
  Filled 2013-04-25 (×17): qty 2

## 2013-04-25 MED ORDER — ROCURONIUM BROMIDE 100 MG/10ML IV SOLN
INTRAVENOUS | Status: DC | PRN
  Administered 2013-04-25: 50 mg via INTRAVENOUS

## 2013-04-25 MED ORDER — OXYCODONE HCL 5 MG PO TABS
5.0000 mg | ORAL_TABLET | Freq: Once | ORAL | Status: AC
  Administered 2013-04-25: 5 mg via ORAL
  Filled 2013-04-25: qty 1

## 2013-04-25 MED ORDER — HYDROCORTISONE NA SUCCINATE PF 1000 MG IJ SOLR
INTRAMUSCULAR | Status: DC | PRN
  Administered 2013-04-25: 100 mg via INTRAVENOUS

## 2013-04-25 MED ORDER — LIDOCAINE HCL (CARDIAC) 20 MG/ML IV SOLN
INTRAVENOUS | Status: AC
Start: 2013-04-25 — End: 2013-04-25
  Filled 2013-04-25: qty 5

## 2013-04-25 MED ORDER — PREDNISONE 10 MG PO TABS: 10.0000 mg | ORAL_TABLET | Freq: Every day | ORAL | Status: DC

## 2013-04-25 MED ORDER — VECURONIUM BROMIDE 10 MG IV SOLR
INTRAVENOUS | Status: AC
  Filled 2013-04-25: qty 10

## 2013-04-25 MED ORDER — SODIUM CHLORIDE 0.9 % IJ SOLN
3.0000 mL | Freq: Two times a day (BID) | INTRAMUSCULAR | Status: DC
  Administered 2013-04-26 – 2013-04-27 (×3): 3 mL via INTRAVENOUS
  Administered 2013-04-27: 21:00:00 via INTRAVENOUS
  Administered 2013-04-28 – 2013-05-01 (×7): 3 mL via INTRAVENOUS

## 2013-04-25 MED ORDER — SODIUM CHLORIDE 0.9 % IR SOLN
Status: DC | PRN
  Administered 2013-04-25: 16:00:00

## 2013-04-25 SURGICAL SUPPLY — 72 items
BAG DECANTER FOR FLEXI CONT (MISCELLANEOUS) ×3 IMPLANT
BLADE SURG ROTATE 9660 (MISCELLANEOUS) ×3 IMPLANT
BUR MATCHSTICK NEURO 3.0 LAGG (BURR) ×3 IMPLANT
CAGE COROENT 10X9X23 (Cage) ×6 IMPLANT
CAGE COROENT MP 8X23 (Cage) ×3 IMPLANT
CANISTER SUCT 3000ML (MISCELLANEOUS) ×3 IMPLANT
CONT SPEC 4OZ CLIKSEAL STRL BL (MISCELLANEOUS) ×6 IMPLANT
COVER BACK TABLE 24X17X13 BIG (DRAPES) IMPLANT
COVER TABLE BACK 60X90 (DRAPES) ×3 IMPLANT
DECANTER SPIKE VIAL GLASS SM (MISCELLANEOUS) ×3 IMPLANT
DERMABOND ADVANCED (GAUZE/BANDAGES/DRESSINGS) ×2
DERMABOND ADVANCED .7 DNX12 (GAUZE/BANDAGES/DRESSINGS) ×1 IMPLANT
DRAPE C-ARM 42X72 X-RAY (DRAPES) ×6 IMPLANT
DRAPE LAPAROTOMY 100X72X124 (DRAPES) ×3 IMPLANT
DRAPE POUCH INSTRU U-SHP 10X18 (DRAPES) ×3 IMPLANT
DRAPE PROXIMA HALF (DRAPES) IMPLANT
DRSG OPSITE POSTOP 4X10 (GAUZE/BANDAGES/DRESSINGS) ×3 IMPLANT
DURAPREP 26ML APPLICATOR (WOUND CARE) ×3 IMPLANT
ELECT REM PT RETURN 9FT ADLT (ELECTROSURGICAL) ×3
ELECTRODE REM PT RTRN 9FT ADLT (ELECTROSURGICAL) ×1 IMPLANT
GAUZE SPONGE 4X4 16PLY XRAY LF (GAUZE/BANDAGES/DRESSINGS) IMPLANT
GLOVE BIO SURGEON STRL SZ 6.5 (GLOVE) ×6 IMPLANT
GLOVE BIO SURGEON STRL SZ7 (GLOVE) ×3 IMPLANT
GLOVE BIO SURGEONS STRL SZ 6.5 (GLOVE) ×3
GLOVE BIOGEL PI IND STRL 6.5 (GLOVE) ×2 IMPLANT
GLOVE BIOGEL PI IND STRL 7.5 (GLOVE) ×1 IMPLANT
GLOVE BIOGEL PI IND STRL 8.5 (GLOVE) ×2 IMPLANT
GLOVE BIOGEL PI INDICATOR 6.5 (GLOVE) ×4
GLOVE BIOGEL PI INDICATOR 7.5 (GLOVE) ×2
GLOVE BIOGEL PI INDICATOR 8.5 (GLOVE) ×4
GLOVE ECLIPSE 8.5 STRL (GLOVE) ×6 IMPLANT
GLOVE EXAM NITRILE LRG STRL (GLOVE) IMPLANT
GLOVE EXAM NITRILE MD LF STRL (GLOVE) ×3 IMPLANT
GLOVE EXAM NITRILE XL STR (GLOVE) IMPLANT
GLOVE EXAM NITRILE XS STR PU (GLOVE) IMPLANT
GOWN BRE IMP SLV AUR LG STRL (GOWN DISPOSABLE) IMPLANT
GOWN BRE IMP SLV AUR XL STRL (GOWN DISPOSABLE) IMPLANT
GOWN STRL REIN 2XL LVL4 (GOWN DISPOSABLE) IMPLANT
GOWN STRL REUS W/ TWL LRG LVL3 (GOWN DISPOSABLE) ×3 IMPLANT
GOWN STRL REUS W/TWL 2XL LVL3 (GOWN DISPOSABLE) ×6 IMPLANT
GOWN STRL REUS W/TWL LRG LVL3 (GOWN DISPOSABLE) ×6
GOWN STRL REUS W/TWL MED LVL3 (GOWN DISPOSABLE) ×3 IMPLANT
HEMOSTAT POWDER KIT SURGIFOAM (HEMOSTASIS) IMPLANT
KIT BASIN OR (CUSTOM PROCEDURE TRAY) ×3 IMPLANT
KIT ROOM TURNOVER OR (KITS) ×3 IMPLANT
NEEDLE HYPO 22GX1.5 SAFETY (NEEDLE) ×3 IMPLANT
NEEDLE SPNL 18GX3.5 QUINCKE PK (NEEDLE) ×3 IMPLANT
NS IRRIG 1000ML POUR BTL (IV SOLUTION) ×3 IMPLANT
PACK FOAM VITOSS 10CC (Orthopedic Implant) ×3 IMPLANT
PACK LAMINECTOMY NEURO (CUSTOM PROCEDURE TRAY) ×3 IMPLANT
PAD ARMBOARD 7.5X6 YLW CONV (MISCELLANEOUS) ×9 IMPLANT
PATTIES SURGICAL .5 X1 (DISPOSABLE) ×3 IMPLANT
ROD TI 5.5X70 (Rod) ×6 IMPLANT
SCREW 6.5X40MM (Screw) ×12 IMPLANT
SCREW BN 40X6.5XPA NS SPNE (Screw) ×6 IMPLANT
SCREW LOCK (Screw) ×12 IMPLANT
SCREW LOCK 100X5.5X OPN (Screw) ×6 IMPLANT
SPONGE GAUZE 4X4 12PLY (GAUZE/BANDAGES/DRESSINGS) ×3 IMPLANT
SPONGE LAP 4X18 X RAY DECT (DISPOSABLE) IMPLANT
SPONGE SURGIFOAM ABS GEL 100 (HEMOSTASIS) ×3 IMPLANT
SUT VIC AB 1 CT1 18XBRD ANBCTR (SUTURE) ×2 IMPLANT
SUT VIC AB 1 CT1 8-18 (SUTURE) ×4
SUT VIC AB 2-0 CP2 18 (SUTURE) ×6 IMPLANT
SUT VIC AB 3-0 SH 8-18 (SUTURE) ×9 IMPLANT
SYR 20ML ECCENTRIC (SYRINGE) ×3 IMPLANT
SYR 3ML LL SCALE MARK (SYRINGE) ×12 IMPLANT
SYR 5ML LL (SYRINGE) IMPLANT
TOWEL OR 17X24 6PK STRL BLUE (TOWEL DISPOSABLE) ×3 IMPLANT
TOWEL OR 17X26 10 PK STRL BLUE (TOWEL DISPOSABLE) ×3 IMPLANT
TRAP SPECIMEN MUCOUS 40CC (MISCELLANEOUS) ×3 IMPLANT
TRAY FOLEY CATH 14FRSI W/METER (CATHETERS) ×3 IMPLANT
WATER STERILE IRR 1000ML POUR (IV SOLUTION) ×3 IMPLANT

## 2013-04-25 NOTE — Anesthesia Procedure Notes (Signed)
Procedure Name: Intubation Date/Time: 04/25/2013 5:17 PM Performed by: Maude Leriche D Pre-anesthesia Checklist: Patient identified, Emergency Drugs available, Suction available, Patient being monitored and Timeout performed Patient Re-evaluated:Patient Re-evaluated prior to inductionOxygen Delivery Method: Circle system utilized Preoxygenation: Pre-oxygenation with 100% oxygen Intubation Type: IV induction Ventilation: Mask ventilation without difficulty Laryngoscope Size: Miller and 2 Grade View: Grade I Tube type: Oral Tube size: 7.0 mm Number of attempts: 1 Airway Equipment and Method: Stylet Placement Confirmation: ETT inserted through vocal cords under direct vision,  positive ETCO2 and breath sounds checked- equal and bilateral Secured at: 21 cm Tube secured with: Tape Dental Injury: Teeth and Oropharynx as per pre-operative assessment

## 2013-04-25 NOTE — Anesthesia Preprocedure Evaluation (Signed)
Anesthesia Evaluation  Patient identified by MRN, date of birth, ID band Patient awake    Reviewed: Allergy & Precautions, H&P , NPO status , Patient's Chart, lab work & pertinent test results  Airway Mallampati: I TM Distance: >3 FB Neck ROM: Full    Dental   Pulmonary          Cardiovascular hypertension, Pt. on medications + CAD and + Cardiac Stents + dysrhythmias     Neuro/Psych Anxiety Depression    GI/Hepatic GERD-  Medicated and Controlled,  Endo/Other    Renal/GU      Musculoskeletal   Abdominal   Peds  Hematology   Anesthesia Other Findings   Reproductive/Obstetrics                           Anesthesia Physical Anesthesia Plan  ASA: III  Anesthesia Plan: General   Post-op Pain Management:    Induction: Intravenous  Airway Management Planned: Oral ETT  Additional Equipment:   Intra-op Plan:   Post-operative Plan: Extubation in OR  Informed Consent: I have reviewed the patients History and Physical, chart, labs and discussed the procedure including the risks, benefits and alternatives for the proposed anesthesia with the patient or authorized representative who has indicated his/her understanding and acceptance.     Plan Discussed with: CRNA and Surgeon  Anesthesia Plan Comments:         Anesthesia Quick Evaluation

## 2013-04-25 NOTE — Transfer of Care (Signed)
Immediate Anesthesia Transfer of Care Note  Patient: Betty Jackson  Procedure(s) Performed: Procedure(s) with comments: Lumbar Two-Three Lumbar Three-Four Posterior lumbar interbody fusion (N/A) - Lumbar Two-Three Lumbar Three-Four Posterior lumbar interbody fusion  Patient Location: PACU  Anesthesia Type:General  Level of Consciousness: awake  Airway & Oxygen Therapy: Patient Spontanous Breathing and Patient connected to face mask oxygen  Post-op Assessment: Report given to PACU RN and Post -op Vital signs reviewed and stable  Post vital signs: Reviewed and stable  Complications: No apparent anesthesia complications

## 2013-04-25 NOTE — Op Note (Signed)
Date of surgery: 04/25/2013 Preoperative diagnosis: Lumbar stenosis, scoliosis, cauda equina syndrome Postoperative diagnosis: Lumbar stenosis, scoliosis, cauda equina syndrome L2-L4. Procedure: Lumbar laminectomy L2 and L3 decompression of central canal and L2-L3 and L4 nerve roots bilaterally posterior lumbar interbody arthrodesis L2-3 and L3-4 pedicle screw fixation L2-L4 posterior lateral arthrodesis L2-L4 with local autograft and allograft. Removal of x-stop L3-L4 Surgeon: Kristeen Miss Anesthesia: Gen. endotracheal Indications: The patient is a 79 year old individual is had significant back and bilateral lower extremity pain and weakness to the point she is having substantial difficulty with walking she has had a previous X. stops procedure she has gotten progressively worse in terms of her ability to walk and even stand up for brief periods of time without substantial pain in her back and weakness in her legs. An MRI demonstrates severe spondylitic stenosis on top of a formidable scoliosis in the mid lumbar spine. She is of eyes regarding surgery.  Procedure: The patient was brought to the operating room supine on a stretcher. After the smooth induction of general endotracheal anesthesia, she was carefully turned prone. The back was prepped with alcohol and DuraPrep and draped in a sterile fashion. A midline incision was made and carried down to the lumbar dorsal fascia. The incision was opened on either side of the lumbar dorsal fascia to expose the X. stop at L3-L4. And a subperiosteal dissection was performed up to L2. The X. stop was then loosened the screw removed and the wings were removed removing the entirety of the X. stop. The L3-4 area was then inspected carefully and the dissection was carried out over the facet joints to expose the transverse processes of L2-L3 and L4. These were packed away for later he use in grafting. Laminectomies were then performed removing the entire laminar arch of  L3 and a portion of the laminar arch of L2 including the entire inferior facet joint of L2-L3. The common dural tube was exposed. The dura was noted to have an inherent film of fibrosis which cause some additional compression particularly at L3-L4. Superior portion of the L4 lamina was in undercut to allow further decompression. The dissection was then carried out to the lateral gutters to expose the L4 nerve root inferiorly this was decompressed of significant stenosis in the subarticular region. This was done bilaterally. 2-3 mm Kerrison punch was used for this purpose. The L3 nerve root was then uncovered and decompressed of significant stenosis in the foramen bilaterally using a 2 and 3 mm Kerrison punch. The dissection was then carried further cephalad to expose the L2 nerve roots which were noted to be severely stenotic in the foramen and these again were decompressed using 2 and 3 mm Kerrison punches and a high-speed drill. The disc spaces at L2-3 and L3-4 were then isolated  Complete discectomies were performed at L2-3 and L3-4. At L3 410 mm peek spacers were sized fitted in eventually packed into the disc space after complete discectomy was performed carefully decorticating the endplates to allow good grafting surface is for bony ingrowth into the disc space. At L2 L3-1 singular 8 mm cage was placed into the interspace on the right side. This was the side that was in substantially more closed. Once the cages were placed the interspaces were packed with additional bone a total of 9 cc of bone was placed in L. to L3 and 90 cc of bone was placed in L3-L4. Pedicle screw entry sites were then chosen at L2-L3 and L4. These were confirmed radiographically.  Each individual protocol was then tapped to 6.5 mm tap and 6.5 x 40 mm screws were placed in L2-L3 and L4. The lateral gutters were then packed with the remainder of the autograft and allograft in the form of the cost bone sponge. The screw heads were  connected together with a 70 mm precontoured rod. This was fixed in a neutral position. Hemostasis was then checked along the common dural tube and the take off of the nerve roots Gelfoam was used to pad the nerve roots to protect them from any falling bone graft. Once final radiographs were obtained in the construct was noted to be good lumbar dorsal fascia was closed with #1 Vicryl in interrupted fashion. 20 cc of half percent Marcaine was injected into the subcutaneous space along the paraspinous fascia. Finally 2-0 Vicryl is used to close the subcuticular and subcutaneous tissues and Dermabond was placed on the skin. Blood loss was estimated at 1000 cc. 400cc of Cell Saver blood was returned to the patient.

## 2013-04-25 NOTE — Progress Notes (Signed)
Pt states pain in buttocks has eased up after the pain med and is now a level 4/10

## 2013-04-25 NOTE — H&P (Signed)
CHIEF COMPLAINT:                                          Back pain, bilateral lower extremity pain, and weakness.  HISTORY OF PRESENT ILLNESS:                     Betty Jackson is a 79 year old right-handed individual who tells me that since sometime in the summer she has been developing progressively worsening back pain.  It initially seem to start more on the left side, but now involves both the lower extremities.  She has a substantial history of temporal arteritis and has been on prednisone for a period of time.  She notes that when the prednisone was started, she was able to tolerate lot of activity.  Despite this, the back pain became progressively worse over time.  Since about November, she has been having significant difficulty ambulating and today she presents in a wheelchair, having been essentially wheelchair bound for the past several weeks.  She has been seen by Dr. Suella Broad and Dr. Susa Day.  She has had an MRI of the lumbar spine performed and this was done on two separate occasions once on 03/31/2013 and then in September of last year.  She was referred to Dr. Suella Broad for injections of epidural steroids.  Prior to this, she tells me that about five years ago she had an X-STOP procedure performed in [place].  She was having some similar symptoms at that time and she notes that X-STOP relieved those symptoms wonderfully and she had been fairly functional until this recent episode.  In the office today, I had the opportunity to review the MRI from 03/31/2013 which demonstrates that the patient has a substantial levoscoliosis in the midlumbar spine.  There is a severe central stenosis at L3-4 and moderately severe central stenosis at L2-3.  There are spondylitic changes at L1-2 and L4-5 and L5-S1 above and below the areas of the worst changes.  No plain radiographs accompanied this although some had been performed elsewhere.  Today in the office, I obtained an AP and lateral radiographs  of the lumbar spine.  The lateral radiographs demonstrates that there is complete loss of a normal lumbar lordosis with a kyphotic angulation throughout the lumbar spine.  On the AP view, she has a 36-degree scoliosis between L2 and L4.  PAST MEDICAL HISTORY:    Current Medical Conditions:  Reveals that the patient has had temporal arteritis diagnosed this year and she has been on prednisone and currently is taking 12.5 mg a day.  She had been using as much as 40 mg a day and had to taper down to about a 1 mg a day, but she is requiring this for recurrent bouts of that problem.    Prior Operations:  She has had a history of stent placed in her heart about eight years ago.  Recent use of clopidogrel has been secondary to the temporal arteritis.    Medications and Allergies:  She also is taking some OxyContin, Percocet, and amitriptyline.  Vitamin D supplements, Os-Cal, pantoprazole, clopidogrel, ascorbic acid, temazepam, metoprolol, atorvastatin, alprazolam, losartan, amlodipine, Neurontin, and prednisone is noted.  PHYSICAL EXAMINATION:  On physical examination, I note that she can stand with a 10-degree forward stoop.  She did not stand completely straight and erect.  Proximally, she has a moderate amount of weakness in the iliopsoas and quadriceps bilaterally rated at 4/5.  Her tibialis anterior strength is weak into 4/5, bit worse on the left than on the right.  Gastrocs strength appears intact.  The patient notes that her bowel and bladder control has been intact.  IMPRESSION/PLAN:                                         The patient has evidence of advanced scoliosis with spondylitic stenosis having had an X-STOP placed at L3-4 in the past.  She now has significant rotatory component accentuating the stenosis at L3-4 and contributing substantially to the stenosis at L2-3.  I indicated that from a surgical perspective, decompression of L2-3 and L3-4 are necessary in order  to gain and preserve her neurologic function.  To do this adequately, we will require removal of the laminar arch and entire facet complex at L3-4 and L2-3.  She will therefore need a stabilization procedure from L2-4.  The true scoliosis operation would require that she be fused from T10-S1; however, I do not believe that she would tolerate this well nor she would be able to heal from this process adequately.  However, the surgery more limiting the scope across L2-4 should give her relief of the worst stenosis and allow her to retain some neurologic function, so that she can walk.  We discussed the risks of the surgery including the potential for spinal fluid leak and infection.  There is also possibly that she may not heal a two-level fusion adequately and this could necessitate further surgical intervention.  In any event at the current time, she is not tolerating the pain well despite using strong narcotic medication in the form of OxyContin and oxycodone.  I believe that ultimately she will need to consider the surgical intervention where she will be resigned to being wheelchair bound.  She is admitted for surgery today.

## 2013-04-26 LAB — GLUCOSE, CAPILLARY
GLUCOSE-CAPILLARY: 125 mg/dL — AB (ref 70–99)
Glucose-Capillary: 103 mg/dL — ABNORMAL HIGH (ref 70–99)
Glucose-Capillary: 134 mg/dL — ABNORMAL HIGH (ref 70–99)
Glucose-Capillary: 133 mg/dL — ABNORMAL HIGH (ref 70–99)

## 2013-04-26 LAB — BASIC METABOLIC PANEL
BUN: 15 mg/dL (ref 6–23)
CHLORIDE: 102 meq/L (ref 96–112)
CO2: 24 mEq/L (ref 19–32)
CREATININE: 0.92 mg/dL (ref 0.50–1.10)
Calcium: 8.1 mg/dL — ABNORMAL LOW (ref 8.4–10.5)
GFR calc non Af Amer: 59 mL/min — ABNORMAL LOW (ref >90)
GFR, EST AFRICAN AMERICAN: 68 mL/min — AB (ref >90)
GLUCOSE: 148 mg/dL — AB (ref 70–99)
Potassium: 5 mEq/L (ref 3.7–5.3)
Sodium: 139 mEq/L (ref 137–147)

## 2013-04-26 LAB — CBC
HCT: 30.5 % — ABNORMAL LOW (ref 36.0–46.0)
Hemoglobin: 10.5 g/dL — ABNORMAL LOW (ref 12.0–15.0)
MCH: 31.3 pg (ref 26.0–34.0)
MCHC: 34.4 g/dL (ref 30.0–36.0)
MCV: 90.8 fL (ref 78.0–100.0)
Platelets: 147 10*3/uL — ABNORMAL LOW (ref 150–400)
RBC: 3.36 MIL/uL — ABNORMAL LOW (ref 3.87–5.11)
RDW: 13.1 % (ref 11.5–15.5)
WBC: 22.1 10*3/uL — ABNORMAL HIGH (ref 4.0–10.5)

## 2013-04-26 MED ORDER — SODIUM CHLORIDE 0.9 % IV SOLN
INTRAVENOUS | Status: DC
  Administered 2013-04-26 (×2): via INTRAVENOUS

## 2013-04-26 MED ORDER — MORPHINE SULFATE 2 MG/ML IJ SOLN
1.0000 mg | INTRAMUSCULAR | Status: DC | PRN
  Administered 2013-04-26 (×5): 4 mg via INTRAVENOUS
  Filled 2013-04-26 (×4): qty 2

## 2013-04-26 MED FILL — Sodium Chloride IV Soln 0.9%: INTRAVENOUS | Qty: 2000 | Status: AC

## 2013-04-26 MED FILL — Heparin Sodium (Porcine) Inj 1000 Unit/ML: INTRAMUSCULAR | Qty: 30 | Status: AC

## 2013-04-26 NOTE — Progress Notes (Signed)
CSW consulted for SNF. PT/OT recommending home health--CSW spoke with PT/OT and they state pt will be discharged home with home health and that he has family support. CSW signing off.   Ky Barban, MSW, [facility] Clinical Social Worker [phone]

## 2013-04-26 NOTE — Progress Notes (Signed)
Occupational Therapy Evaluation Patient Details Name: Betty Jackson MRN: [id number] DOB: 13-Mar-1935 Today's Date: 04/26/2013 Time: 7591-6384 OT Time Calculation (min): 28 min  OT Assessment / Plan / Recommendation History of present illness L2-3 decompression. L3-4 fusion   Clinical Impression   PTA, pt mod I with mobility @ RW level and ADL. Pt presetns with decreased functional status. Pt with decreased BP to @ 87/43 in sitting, and maintained as such. Pt not symptomatic. Began education with pt/family regarding back precautions and ADL. Pt will benefit from skilled OT services to facilitate D/C home wit [address] due to below deficits. Will begin education on AE.    OT Assessment  Patient needs continued OT Services    Follow Up Recommendations  Home health OT;Supervision/Assistance - 24 hour    Barriers to Discharge      Equipment Recommendations  3 in 1 bedside comode    Recommendations for Other Services    Frequency  Min 2X/week    Precautions / Restrictions Precautions Precautions: Fall;Back;Other (comment) (orthostatic) Precaution Booklet Issued: Yes (comment) Required Braces or Orthoses: Spinal Brace Spinal Brace: Lumbar corset;Applied in sitting position   Pertinent Vitals/Pain See vitals orthostatic    ADL  Eating/Feeding: Modified independent Where Assessed - Eating/Feeding: Chair Grooming: Minimal assistance Where Assessed - Grooming: Supported sitting Upper Body Bathing: Minimal assistance Where Assessed - Upper Body Bathing: Supported sitting Lower Body Bathing: Maximal assistance Where Assessed - Lower Body Bathing: Supported sit to stand Upper Body Dressing: Moderate assistance Where Assessed - Upper Body Dressing: Supported sitting Lower Body Dressing: Maximal assistance Where Assessed - Lower Body Dressing: Supported sit to Lobbyist: +2 Total assistance;Minimal assistance Toilet Transfer Method: Sit to stand;Stand pivot Community education officer: Other (comment) (chair) Toileting - Clothing Manipulation and Hygiene: Moderate assistance Where Assessed - Toileting Clothing Manipulation and Hygiene: Sit to stand from 3-in-1 or toilet Equipment Used: Back brace;Gait belt;Rolling walker Transfers/Ambulation Related to ADLs: min A sit - stand +2 for safety due to BP ADL Comments: Began education on AE /compensatory techniques for ADL following back precautions    OT Diagnosis: Generalized weakness;Acute pain  OT Problem List: Decreased strength;Decreased activity tolerance;Decreased safety awareness;Decreased knowledge of use of DME or AE;Decreased knowledge of precautions;Cardiopulmonary status limiting activity;Pain OT Treatment Interventions: Self-care/ADL training;Energy conservation;DME and/or AE instruction;Therapeutic activities;Patient/family education   OT Goals(Current goals can be found in the care plan section) Acute Rehab OT Goals Patient Stated Goal: to go home OT Goal Formulation: With patient Time For Goal Achievement: 05/10/13 Potential to Achieve Goals: Good  Visit Information  Last OT Received On: 04/26/13 Assistance Needed: +2 (for safety at this time due to BP) Reason for Co-Treatment:  (partial session) History of Present Illness: L2-3 decompression. L3-4 fusion       Prior [name] expects to be discharged to:: Private residence Living Arrangements: Spouse/significant other;Children Available Help at Discharge: Family;Available 24 hours/day Type of Home: House Home Access: Level entry Home Layout: Other (Comment) Home Equipment: Walker - 2 wheels;Cane - single point;Shower seat;Hand held shower head;Other (comment) Prior Function Level of Independence: Independent;Independent with assistive device(s) (used RW) Comments: increased difficulty for last 8 weeks due to back pain Communication Communication: No difficulties         Vision/Perception Vision  - History Baseline Vision: Wears glasses all the time   Cognition  Cognition Arousal/Alertness: Awake/alert Behavior During Therapy: WFL for tasks assessed/performed Overall Cognitive Status: Within Functional Limits for tasks assessed  Extremity/Trunk Assessment Upper Extremity Assessment Upper Extremity Assessment: Generalized weakness (arthritic deformities B hands) Lower Extremity Assessment Lower Extremity Assessment: Generalized weakness Cervical / Trunk Assessment Cervical / Trunk Assessment: Other exceptions (fusion)     Mobility Bed Mobility Overal bed mobility: Needs Assistance Transfers Overall transfer level: Needs assistance Transfers: Sit to/from Stand;Stand Pivot Transfers Sit to Stand: +2 safety/equipment;Min assist Stand pivot transfers: +2 safety/equipment;Mod assist General transfer comment: increased assistance to control descent     Exercise Other Exercises Other Exercises: encouraged BU/LE AROM   Balance Balance Overall balance assessment: Needs assistance Sitting-balance support: Feet supported;Bilateral upper extremity supported Sitting balance-[name]: Fair Sitting balance - Comments: continued to posterior sway Postural control: Posterior lean Standing balance support: Bilateral upper extremity supported Standing balance-[name]: Fair General Comments General comments (skin integrity, edema, etc.): c/o minimal dizziness with stand pivot. Pt reports feeling better after sitting up for several minutes   End of Session OT - End of Session Equipment Utilized During Treatment: Back brace;Rolling walker;Gait belt Activity Tolerance: Patient tolerated treatment well Patient left: in chair;with call bell/phone within reach;with family/visitor present Nurse Communication: Mobility status;Precautions;Other (comment) (need for +2 for safety)  GO     [name],[name] 04/26/2013, 1:56 PM [facility], OTR/L  [phone] 04/26/2013

## 2013-04-26 NOTE — Progress Notes (Signed)
Pt requested to get back in bed. Had been in chair for ~30 min. Pain meds given per request and two RNs attempted to get pt up out of the chair. Pt able to stand but unable to straighten legs. Pt felt too weak. Sat back in chair. Pt asked to wait a min. Waited and then asked if pt was ready. Pt did not respond. Shook pt- no response. Pt with blank stare. Pupils equal and reactive. SBP 80s. HR and O2 sats unchanged. Approx. 15 sec later pt moving. Delayed response but oriented. Unaware of event. Too weak to get back in bed. Sky lift used. SBP 110s. MD notified. Feels it is more likely a vasogenic response than sz activity. Would like to restart NS and cont to get pt OOB today. Pt to stay in ICU for now. Will cont to monitor closely.

## 2013-04-26 NOTE — Progress Notes (Signed)
Subjective: Patient reports Vasovagal episodes noted today back pain reasonable. Legs feel better.  Objective: Vital signs in last 24 hours: Temp:  [97.5 F (36.4 C)-102 F (38.9 C)] 102 F (38.9 C) (01/28 2000) Pulse Rate:  [63-97] 87 (01/28 2000) Resp:  [10-31] 20 (01/28 2000) BP: (83-165)/(38-121) 118/42 mmHg (01/28 2000) SpO2:  [93 %-100 %] 95 % (01/28 2000) Weight:  [71.4 kg (157 lb 6.5 oz)] 71.4 kg (157 lb 6.5 oz) (01/27 2300)  Intake/Output from previous day: 01/27 0701 - 01/28 0700 In: 4528.3 [P.O.:360; I.V.:3408.3; Blood:410; IV Piggyback:350] Out: 1950 [Urine:950; Blood:1000] Intake/Output this shift: Total I/O In: 100 [I.V.:100] Out: -   Incision is clean and dry. Motor function good.  Lab Results:  Recent Labs  04/26/13 0252  WBC 22.1*  HGB 10.5*  HCT 30.5*  PLT 147*   BMET  Recent Labs  04/26/13 0252  NA 139  K 5.0  CL 102  CO2 24  GLUCOSE 148*  BUN 15  CREATININE 0.92  CALCIUM 8.1*    Studies/Results: Dg Lumbar Spine 2-3 Views  04/25/2013   CLINICAL DATA:  L2-L4 posterior lumbar interbody fusion  EXAM: DG C-ARM 1-60 MIN; LUMBAR SPINE - 2-3 VIEW  COMPARISON:  Preoperative radiographs 04/06/2013; lumbar spine MRI 03/31/2013  FINDINGS: Frontal and a lateral intraoperative radiographs demonstrate interval posterior lumbar interbody fusion of L2-L4 (reportedly) with bilateral pedicle screw and rod construct an interbody grafts at L2-L3 and L3-L4 there are associated laminectomy defects. No evidence of acute hardware complication.  IMPRESSION: L2-L4 posterior lumbar interbody fusion as above.   Electronically Signed   By: Jacqulynn Cadet M.D.   On: 04/25/2013 21:33   Dg C-arm 1-60 Min  04/25/2013   CLINICAL DATA:  L2-L4 posterior lumbar interbody fusion  EXAM: DG C-ARM 1-60 MIN; LUMBAR SPINE - 2-3 VIEW  COMPARISON:  Preoperative radiographs 04/06/2013; lumbar spine MRI 03/31/2013  FINDINGS: Frontal and a lateral intraoperative radiographs demonstrate  interval posterior lumbar interbody fusion of L2-L4 (reportedly) with bilateral pedicle screw and rod construct an interbody grafts at L2-L3 and L3-L4 there are associated laminectomy defects. No evidence of acute hardware complication.  IMPRESSION: L2-L4 posterior lumbar interbody fusion as above.   Electronically Signed   By: Jacqulynn Cadet M.D.   On: 04/25/2013 21:33    Assessment/Plan: Stable postop labs reveal moderate acute blood loss anemia. We'll not need further transfusion at this time.  LOS: 1 day  Continue supportive care transfer to floor in the morning.   Orel Hord J 04/26/2013, 9:04 PM

## 2013-04-26 NOTE — Anesthesia Postprocedure Evaluation (Signed)
Anesthesia Post Note  Patient: Betty Jackson  Procedure(s) Performed: Procedure(s) (LRB): Lumbar Two-Three Lumbar Three-Four Posterior lumbar interbody fusion (N/A)  Anesthesia type: General  Patient location: PACU  Post pain: Pain level controlled  Post assessment: Patient's Cardiovascular Status Stable  Last Vitals:  Filed Vitals:   04/25/13 2330  BP: 128/86  Pulse: 87  Temp:   Resp: 14    Post vital signs: Reviewed and stable  Level of consciousness: alert  Complications: No apparent anesthesia complications

## 2013-04-26 NOTE — Evaluation (Signed)
Physical Therapy Evaluation Patient Details Name: Betty Jackson MRN: [id number] DOB: [birth year] Today's Date: 04/26/2013 Time: 2010-0712 PT Time Calculation (min): 36 min  PT Assessment / Plan / Recommendation History of Present Illness  L2-3 decompression. L3-4 fusion  Clinical Impression  Pt with understanding of precautions and tolerated session well. Pt with mild dizziness and noted orthostatic hypotension (see vital section). Ambulation limited by low BP this date. Suspect pt to progress well enough to return home with 24/7 supervision, HHPT and use of RW.    PT Assessment  Patient needs continued PT services    Follow Up Recommendations  Home health PT;Supervision/Assistance - 24 hour    Does the patient have the potential to tolerate intense rehabilitation      Barriers to Discharge        Equipment Recommendations  3in1 (PT)    Recommendations for Other Services     Frequency Min 4X/week    Precautions / Restrictions Precautions Precautions: Fall;Back;Other (comment) Precaution Booklet Issued: Yes (comment) Precaution Comments: pt with verbal understanding Required Braces or Orthoses: Spinal Brace Spinal Brace: Lumbar corset;Applied in sitting position   Pertinent Vitals/Pain Lying: BP 105/42 Sitting: 93/67 Standing: 85/48 Sitting in chair after transfer: 85/45      Mobility  Bed Mobility Overal bed mobility: Needs Assistance Bed Mobility: Rolling;Sidelying to Sit Rolling: Mod assist Sidelying to sit: Mod assist General bed mobility comments: max directional v/c's for technique, modA to achieve full rolling Transfers Overall transfer level: Needs assistance Transfers: Sit to/from Stand Sit to Stand: +2 safety/equipment;Min assist Stand pivot transfers: +2 safety/equipment;Mod assist General transfer comment: increased assistance to control descent Ambulation/Gait Ambulation/Gait assistance:  (held today due to BP)    Exercises Other Exercises Other  Exercises: encouraged BU/LE AROM   PT Diagnosis: Difficulty walking;Acute pain  PT Problem List: Decreased strength;Decreased activity tolerance;Decreased balance;Decreased mobility PT Treatment Interventions: DME instruction;Gait training;Functional mobility training;Therapeutic activities;Therapeutic exercise     PT Goals(Current goals can be found in the care plan section) Acute Rehab PT Goals Patient Stated Goal: to go home PT Goal Formulation: With patient Time For Goal Achievement: 05/03/13 Potential to Achieve Goals: Good  Visit Information  Last PT Received On: 04/26/13 Assistance Needed: +2 PT/OT/SLP Co-Evaluation/Treatment: Yes Reason for Co-Treatment: Complexity of the patient's impairments (multi-system involvement) PT goals addressed during session: Mobility/safety with mobility History of Present Illness: L2-3 decompression. L3-4 fusion       Prior [place] expects to be discharged to:: Private residence Living Arrangements: Spouse/significant other;Children Available Help at Discharge: Family;Available 24 hours/day Type of Home: House Home Access: Level entry Home Layout: Other (Comment) Home Equipment: Walker - 2 wheels;Cane - single point;Shower seat;Hand held shower head;Other (comment) Prior Function Level of Independence: Independent;Independent with assistive device(s) (used RW) Comments: increased difficulty for last 8 weeks due to back pain Communication Communication: No difficulties    Cognition  Cognition Arousal/Alertness: Awake/alert Behavior During Therapy: WFL for tasks assessed/performed Overall Cognitive Status: Within Functional Limits for tasks assessed    Extremity/Trunk Assessment Upper Extremity Assessment Upper Extremity Assessment: Generalized weakness Lower Extremity Assessment Lower Extremity Assessment: Generalized weakness Cervical / Trunk Assessment Cervical / Trunk Assessment: Other  exceptions (fusion)   Balance Balance Overall balance assessment: Needs assistance Sitting-balance support: Feet supported;Bilateral upper extremity supported Sitting balance-[name]: Fair Sitting balance - Comments: continued to posterior sway Postural control: Posterior lean Standing balance support: Bilateral upper extremity supported Standing balance-[name]: Fair General Comments General comments (skin integrity, edema, etc.):  c/o mild dizziness. note vital section for BPs  End of Session    GP     Kingsley Callander 04/26/2013, 3:28 PM  Kittie Plater, PT, DPT Pager #: [phone] Office #: [phone]

## 2013-04-26 NOTE — Progress Notes (Signed)
UR completed.  Layah Skousen, RN BSN M[facility] CCM Trauma/Neuro ICU Case Manager [phone]  

## 2013-04-27 LAB — GLUCOSE, CAPILLARY
GLUCOSE-CAPILLARY: 100 mg/dL — AB (ref 70–99)
GLUCOSE-CAPILLARY: 124 mg/dL — AB (ref 70–99)
Glucose-Capillary: 123 mg/dL — ABNORMAL HIGH (ref 70–99)
Glucose-Capillary: 116 mg/dL — ABNORMAL HIGH (ref 70–99)

## 2013-04-27 MED ORDER — KETOROLAC TROMETHAMINE 15 MG/ML IJ SOLN
15.0000 mg | Freq: Four times a day (QID) | INTRAMUSCULAR | Status: AC
  Administered 2013-04-27 – 2013-04-28 (×5): 15 mg via INTRAVENOUS
  Filled 2013-04-27 (×5): qty 1

## 2013-04-27 MED ORDER — ONDANSETRON HCL 4 MG/2ML IJ SOLN: 4.0000 mg | Freq: Four times a day (QID) | INTRAMUSCULAR | Status: DC | PRN

## 2013-04-27 NOTE — Progress Notes (Signed)
Physical Therapy Treatment Patient Details Name: Betty Jackson MRN: [id number] DOB: August 28, 1934 Today's Date: 04/27/2013 Time: 7253-6644 PT Time Calculation (min): 19 min  PT Assessment / Plan / Recommendation  History of Present Illness L2-3 decompression. L3-4 fusion   PT Comments   Pt able to ambulate minimally today. Continues to require (A) for mobility to maintain balance and cues for back precautions.Pt and daughter educated thoroughly on back precautions and log rolling technique. Pt is motivated to progress with therapy but requires incr time due to pain and back precautions. Will cont to follow per POC. BP more stable today during session; see vitals.   Follow Up Recommendations  Home health PT;Supervision/Assistance - 24 hour     Does the patient have the potential to tolerate intense rehabilitation     Barriers to Discharge        Equipment Recommendations  3in1 (PT)    Recommendations for Other Services    Frequency Min 4X/week   Progress towards PT Goals Progress towards PT goals: Progressing toward goals  Plan Current plan remains appropriate    Precautions / Restrictions Precautions Precautions: Fall;Back;Other (comment) Precaution Booklet Issued: Yes (comment) Precaution Comments: pt unable to recall back precautions; reviewed back precautions with pt and daughter thoroughly  Required Braces or Orthoses: Spinal Brace Spinal Brace: Lumbar corset;Applied in sitting position Restrictions Weight Bearing Restrictions: No   Pertinent Vitals/Pain Supine 100/36; sitting EOB 120/53; after activity/sititng in chair 97/38; pain 6/10   Mobility  Bed Mobility Overal bed mobility: Needs Assistance Bed Mobility: Rolling;Sidelying to Sit Rolling: Mod assist Sidelying to sit: Mod assist General bed mobility comments: cues and (A) for proper log rolling technique; pt required incr time due to pain; use of draw pad to bring shoulders to upright sitting position at  EOB Transfers Overall transfer level: Needs assistance Equipment used: Rolling walker (2 wheeled) Transfers: Sit to/from Stand Sit to Stand: +2 safety/equipment;Min assist General transfer comment: increased time due to pain; cues for hand placement and sequencing with RW and to maintain back precautions  Ambulation/Gait Ambulation/Gait assistance: Mod assist Ambulation Distance (Feet): 4 Feet Assistive device: Rolling walker (2 wheeled) Gait Pattern/deviations: Decreased stride length;Shuffle;Leaning posteriorly;Trunk flexed;Wide base of support Gait velocity: very decreased Gait velocity interpretation: <1.8 ft/sec, indicative of risk for recurrent falls General Gait Details: pt with difficulty advancing LEs; requires (A) to facilitate weight shift in bil directions; pt with fwd flex trunk throughout ambulating with LOB posterioly unless supported; cues for sequencing and (A) with RW          PT Diagnosis:    PT Problem List:   PT Treatment Interventions:     PT Goals (current goals can now be found in the care plan section) Acute Rehab PT Goals Patient Stated Goal: to go home PT Goal Formulation: With patient Time For Goal Achievement: 05/03/13 Potential to Achieve Goals: Good  Visit Information  Last PT Received On: 04/27/13 Assistance Needed: +2 History of Present Illness: L2-3 decompression. L3-4 fusion    Subjective Data  Subjective: [name]; agreeable to therapy and to attempt to ambulate. pt reports "i was so nauseous this morning"  Patient Stated Goal: to go home   Cognition  Cognition Arousal/Alertness: Awake/alert Behavior During Therapy: WFL for tasks assessed/performed Overall Cognitive Status: Within Functional Limits for tasks assessed    Balance  Balance Overall balance assessment: Needs assistance Sitting-balance support: Feet supported;Bilateral upper extremity supported Sitting balance-[name]: Fair Sitting  balance - Comments:  pt with continued posterior sway; was able to hold herself up with bil UE bracing; cues to maintain upright position with tactile cues at times  Postural control: Posterior lean Standing balance support: During functional activity;Bilateral upper extremity supported Standing balance-[name]: Fair Standing balance comment: bil UE supported with RW General Comments General comments (skin integrity, edema, etc.): denied any dizziness with ambulation today  End of Session PT - End of Session Equipment Utilized During Treatment: Gait belt;Back brace Activity Tolerance: Patient tolerated treatment well Patient left: in chair;with call bell/phone within reach;with family/visitor present Nurse Communication: Mobility status;Patient requests pain meds;Precautions   GP     Betty Jackson [place], [place] [phone] 04/27/2013, 2:14 PM

## 2013-04-27 NOTE — Addendum Note (Signed)
Addendum created 04/27/13 1147 by Lillia Abed, MD   Modules edited: Anesthesia Attestations

## 2013-04-27 NOTE — Progress Notes (Signed)
Subjective: Patient reports having significant back pain. but in good spirits otherwise.  Objective: Vital signs in last 24 hours: Temp:  [99 F (37.2 C)-102 F (38.9 C)] 99.5 F (37.5 C) (01/29 0819) Pulse Rate:  [74-94] 93 (01/29 0800) Resp:  [12-31] 18 (01/29 0800) BP: (83-145)/(42-85) 102/78 mmHg (01/29 0800) SpO2:  [93 %-98 %] 95 % (01/29 0800)  Intake/Output from previous day: 01/28 0701 - 01/29 0700 In: 3000 [P.O.:700; I.V.:2300] Out: 605 [Urine:605] Intake/Output this shift: Total I/O In: 100 [I.V.:100] Out: 450 [Urine:450]  incision clean and dry. Motor appears intact in lower ext.  Lab Results:  Recent Labs  04/26/13 0252  WBC 22.1*  HGB 10.5*  HCT 30.5*  PLT 147*   BMET  Recent Labs  04/26/13 0252  NA 139  K 5.0  CL 102  CO2 24  GLUCOSE 148*  BUN 15  CREATININE 0.92  CALCIUM 8.1*    Studies/Results: Dg Lumbar Spine 2-3 Views  04/25/2013   CLINICAL DATA:  L2-L4 posterior lumbar interbody fusion  EXAM: DG C-ARM 1-60 MIN; LUMBAR SPINE - 2-3 VIEW  COMPARISON:  Preoperative radiographs 04/06/2013; lumbar spine MRI 03/31/2013  FINDINGS: Frontal and a lateral intraoperative radiographs demonstrate interval posterior lumbar interbody fusion of L2-L4 (reportedly) with bilateral pedicle screw and rod construct an interbody grafts at L2-L3 and L3-L4 there are associated laminectomy defects. No evidence of acute hardware complication.  IMPRESSION: L2-L4 posterior lumbar interbody fusion as above.   Electronically Signed   By: Jacqulynn Cadet M.D.   On: 04/25/2013 21:33   Dg C-arm 1-60 Min  04/25/2013   CLINICAL DATA:  L2-L4 posterior lumbar interbody fusion  EXAM: DG C-ARM 1-60 MIN; LUMBAR SPINE - 2-3 VIEW  COMPARISON:  Preoperative radiographs 04/06/2013; lumbar spine MRI 03/31/2013  FINDINGS: Frontal and a lateral intraoperative radiographs demonstrate interval posterior lumbar interbody fusion of L2-L4 (reportedly) with bilateral pedicle screw and rod  construct an interbody grafts at L2-L3 and L3-L4 there are associated laminectomy defects. No evidence of acute hardware complication.  IMPRESSION: L2-L4 posterior lumbar interbody fusion as above.   Electronically Signed   By: Jacqulynn Cadet M.D.   On: 04/25/2013 21:33    Assessment/Plan: Stable , syncopal episode yesterday on getting out of bed.   LOS: 2 days  Hep well IVF and tx to 4n   Jule Whitsel J 04/27/2013, 9:02 AM

## 2013-04-28 LAB — GLUCOSE, CAPILLARY
GLUCOSE-CAPILLARY: 137 mg/dL — AB (ref 70–99)
GLUCOSE-CAPILLARY: 77 mg/dL (ref 70–99)
Glucose-Capillary: 97 mg/dL (ref 70–99)
Glucose-Capillary: 108 mg/dL — ABNORMAL HIGH (ref 70–99)

## 2013-04-28 NOTE — Progress Notes (Signed)
Patient having persistent nausea with one time episode of vomiting around 0840. Antiemetic was given and she is now stable in bed. Vitals as well stable. Will continue to monitor.

## 2013-04-28 NOTE — Progress Notes (Signed)
Occupational Therapy Treatment Patient Details Name: Betty Jackson MRN: [id number] DOB: 02/02/1935 Today's Date: 04/28/2013 Time: 7829-5621 OT Time Calculation (min): 24 min  OT Assessment / Plan / Recommendation  History of present illness L2-3 decompression. L3-4 fusion   OT comments  Pt very lethargic/fatigued today ? Medication related. Pt cont to require +2 assist for ADL's, w/ transfers and safety. Cont w/ acute OT toward POC/goals to assist in maximizing independence w/ ADL/functional transfers for safe d/c home w/ HHOT/24/hr assist vs SNF for STR if necessary. Instruct in A/E next visit.   Follow Up Recommendations  Home health OT;Supervision/Assistance - 24 hour    Barriers to Discharge       Equipment Recommendations  3 in 1 bedside comode    Recommendations for Other Services    Frequency Min 2X/week   Progress towards OT Goals Progress towards OT goals: Not progressing toward goals - comment (Pt very lethargic/fatigued today, keeping eyes closed most of session. ? due to medications & family reports of pt being up in night and nauseaous/vomiting this am. )  Plan Discharge plan remains appropriate    Precautions / Restrictions Precautions Precautions: Fall;Back Precaution Booklet Issued: Yes (comment) (Left handout in room) Precaution Comments: pt unable to recall back precautions; reviewed back precautions with pt and daughter thoroughly  Required Braces or Orthoses: Spinal Brace Spinal Brace: Lumbar corset;Applied in sitting position Restrictions Weight Bearing Restrictions: No   Pertinent Vitals/Pain No c/o pain. Pt reports lethargic, fatigue.    ADL  Grooming: Performed;Wash/dry hands;Wash/dry face;Set up Where Assessed - Grooming: Supine, head of bed up Upper Body Bathing: Performed;Set up Where Assessed - Upper Body Bathing: Supine, head of bed up Lower Body Bathing: Performed;Maximal assistance Where Assessed - Lower Body Bathing: Rolling right and/or  left Toilet Transfer: Performed;+2 Total assistance Toilet Transfer Method: Other (comment) (Pt agreeable to sit to stand transfer to 3:1 bedside when she had very loose BM in bed prior to rolling onto her side sitting EOB) Toilet Transfer Equipment: Other (comment) (Toileting completed in bed secondary to loose BM prior to rolling onto her side for 3:1) Toileting - Clothing Manipulation and Hygiene: +2 Total assistance Where Assessed - Toileting Clothing Manipulation and Hygiene: Supine, head of bed flat;Rolling right and/or left ADL Comments: Pt agreeable to acute OT for toileting/transfers and A/E training. Pt then had very loose BM in bed prior to rolling onto her side and sitting EOB. Pt was +2 total assist for hygeine and rolling R/L in bed. Pt appeared very groggy/sleepy and family members state that pt had been vomitting earlier and was "up all night having nightmares". Pt/family were educated in back precautions and use of lumbar corset in sitting as pt was unable to state any precautions only "sit up straight". Handout was issued and left for pt/family to review RE: Long handled A/E and ADL's as described on back precaution handout. Pt asleep prior to OT leaving room  today, ?medications.    OT Diagnosis:    OT Problem List:   OT Treatment Interventions:     OT Goals(current goals can now be found in the care plan section) Acute Rehab OT Goals Patient Stated Goal: Sleep/rest  Visit Information  Last OT Received On: 04/28/13 Assistance Needed: +2 History of Present Illness: L2-3 decompression. L3-4 fusion    Subjective Data      Prior Functioning       Cognition  Cognition Arousal/Alertness: Lethargic;Suspect due to medications Behavior During Therapy: Flat affect Overall Cognitive Status:  Within Functional Limits for tasks assessed Memory: Decreased recall of precautions    Mobility  Bed Mobility Overal bed mobility: Needs Assistance Bed Mobility: Rolling Rolling: Mod  assist;+2 for physical assistance General bed mobility comments: Cues and (A) for proper log rolling technique; pt required incr time, verbal, tactile cues due to lethergy/fatigue; use of draw pad to bring up in bed after selfcare/ADL's            End of Session OT - End of Session Activity Tolerance: Patient limited by lethargy;Patient limited by fatigue Patient left: in bed;with call bell/phone within reach;with bed alarm set;with nursing/sitter in room;with family/visitor present Nurse Communication: Mobility status;Precautions;Other (comment) (+2 for safety)  GO     Almyra Deforest 04/28/2013, 11:55 AM

## 2013-04-28 NOTE — Progress Notes (Signed)
Physical Therapy Treatment Patient Details Name: Betty Jackson MRN: [id number] DOB: 12-Apr-1934 Today's Date: 04/28/2013 Time: 7829-5621 PT Time Calculation (min): 26 min  PT Assessment / Plan / Recommendation  History of Present Illness L2-3 decompression. L3-4 fusion   PT Comments   Patient making good gains with mobility and gait today.  Follow Up Recommendations  Home health PT;Supervision/Assistance - 24 hour     Does the patient have the potential to tolerate intense rehabilitation     Barriers to Discharge        Equipment Recommendations  3in1 (PT)    Recommendations for Other Services    Frequency Min 4X/week   Progress towards PT Goals Progress towards PT goals: Progressing toward goals  Plan Current plan remains appropriate    Precautions / Restrictions Precautions Precautions: Fall;Back Precaution Comments: Patient able to recall 2/3 precautions.  However, when PT entered room, patient had LE's flexed and rotated to right.  Reviewed proper bed positioning with patient and family. Required Braces or Orthoses: Spinal Brace Spinal Brace: Lumbar corset;Applied in sitting position Restrictions Weight Bearing Restrictions: No   Pertinent Vitals/Pain     Mobility  Bed Mobility Overal bed mobility: Needs Assistance Bed Mobility: Rolling;Sidelying to Sit;Sit to Sidelying Rolling: Min assist Sidelying to sit: Mod assist Sit to sidelying: Mod assist General bed mobility comments: Reviewed proper bed positioning and helped patient align herself in supine.  Reviewed proper rolling technique.  Assisted patient with flexing knees and moving them along with her shoulders for log rolling.  Assist to raise trunk to sitting position.  Once upright patient with good sitting balance.  Reviewed donning brace.  Patient able to don with mod assist.  At end of session, assist to lower trunk and raise LE's onto bed to return to sidelying. Transfers Overall transfer level: Needs  assistance Equipment used: Rolling walker (2 wheeled) Transfers: Sit to/from Stand Sit to Stand: Mod assist General transfer comment: Verbal cues for hand placement and technique.  Assist to rise to standing and for balance initially.  Cues for technique to return to sitting on bed. Ambulation/Gait Ambulation/Gait assistance: Min assist Ambulation Distance (Feet): 120 Feet Assistive device: Rolling walker (2 wheeled) Gait Pattern/deviations: Step-through pattern;Decreased step length - right;Decreased step length - left;Decreased stride length;Trunk flexed (Knees remained flexed in stance) Gait velocity: very decreased Gait velocity interpretation: Below normal speed for age/gender General Gait Details: Verbal cues for safe use of RW.  Cues to stand upright, and extend knees in stance.    Exercises General Exercises - Lower Extremity Ankle Circles/Pumps: AROM;Both;10 reps;Supine     PT Goals (current goals can now be found in the care plan section)    Visit Information  Last PT Received On: 04/28/13 Assistance Needed: +1 History of Present Illness: L2-3 decompression. L3-4 fusion    Subjective Data  Subjective: Daughter and husband with several questions.  Answered and reviewed information with patient as well.   Cognition  Cognition Arousal/Alertness: Awake/alert Behavior During Therapy: WFL for tasks assessed/performed (Had been nauseated earlier in day) Overall Cognitive Status: Within Functional Limits for tasks assessed    Balance     End of Session PT - End of Session Equipment Utilized During Treatment: Gait belt;Back brace Activity Tolerance: Patient limited by fatigue Patient left: in bed;with call bell/phone within reach;with bed alarm set;with family/visitor present Nurse Communication: Mobility status   GP     Despina Pole 04/28/2013, 6:12 PM Carita Pian. Sanjuana Kava, [place] Pager [phone]

## 2013-04-28 NOTE — Progress Notes (Signed)
OT Cancellation Note  Patient Details Name: Betty Jackson MRN: [id number] DOB: 1935-01-03   Cancelled Treatment:     Reason Eval/treatment not completed: Pt asleep/resting in bed, family reports that pt has been vomiting "all morning and was just given medication and is now sleeping". Family requests OT come back later. Will check back as able for acute OT.  Josephine Igo Dixon 04/28/2013, 10:19 AM

## 2013-04-28 NOTE — Progress Notes (Signed)
Patient ID: Betty Jackson, female   DOB: 1934-05-29, 79 y.o.   MRN: [id number] Patient is feeling better. Ambulating improvement.  Incision is clean and dry. Dressing is been removed.  Patient may shower now and continue to ambulate. Possible discharge sometime this weekend.

## 2013-04-28 NOTE — Progress Notes (Signed)
Talked to patient about DCP/ home health care choices; patient does not know which [place] agency to go with at this time. CM will follow up; Attending MD please order HHPT/OT and 3:1 at discharge; Aneta Mins [phone]

## 2013-04-29 LAB — GLUCOSE, CAPILLARY
GLUCOSE-CAPILLARY: 99 mg/dL (ref 70–99)
GLUCOSE-CAPILLARY: 140 mg/dL — AB (ref 70–99)
Glucose-Capillary: 116 mg/dL — ABNORMAL HIGH (ref 70–99)

## 2013-04-29 NOTE — Progress Notes (Signed)
Subjective: Patient reports This is doing great significant proven leg pain back pain is becoming under better control.  Objective: Vital signs in last 24 hours: Temp:  [97.9 F (36.6 C)-99.5 F (37.5 C)] 99.1 F (37.3 C) (01/31 0542) Pulse Rate:  [74-100] 80 (01/31 0542) Resp:  [18] 18 (01/31 0542) BP: (110-138)/(56-75) 138/57 mmHg (01/31 0542) SpO2:  [97 %-99 %] 97 % (01/31 0542)  Intake/Output from previous day: 01/30 0701 - 01/31 0700 In: 480 [P.O.:480] Out: 350 [Urine:350] Intake/Output this shift:    Patient is awake and alert strength is 5 of 5 wound is clean and dry the family reported a small amount of drainage just do not see any evidence of drainage this morning  Lab Results: No results found for this basename: WBC, HGB, HCT, PLT,  in the last 72 hours BMET No results found for this basename: NA, K, CL, CO2, GLUCOSE, BUN, CREATININE, CALCIUM,  in the last 72 hours  Studies/Results: No results found.  Assessment/Plan: Continue to work with physical occupational therapy plan probable discharge on Monday  LOS: 4 days     Baylee Mccorkel P 04/29/2013, 9:33 AM

## 2013-04-29 NOTE — Progress Notes (Signed)
Occupational Therapy Treatment Patient Details Name: Betty Jackson MRN: [id number] DOB: 12-Mar-1935 Today's Date: 04/29/2013 Time: 1245-8099 OT Time Calculation (min): 25 min  OT Assessment / Plan / Recommendation  History of present illness L2-3 decompression. L3-4 fusion   OT comments  Pt. Progressing well.  Set up for LB dressing eob no a/e needed.  All components of toileting min guard a/ s.  States pain is well managed.  Will have family a at d/c from one of her dtrs. And and pts. sister.  Follow Up Recommendations  Home health OT;Supervision/Assistance - 24 hour          Equipment Recommendations  3 in 1 bedside comode    Recommendations for Other Services    Frequency Min 2X/week   Progress towards OT Goals Progress towards OT goals: Progressing toward goals  Plan Discharge plan remains appropriate    Precautions / Restrictions Precautions Precautions: Fall;Back Precaution Comments: able to recall 3/3 precautions Required Braces or Orthoses: Spinal Brace Spinal Brace: Lumbar corset;Applied in sitting position   Pertinent Vitals/Pain No c/o pain    ADL  Grooming: Performed;Wash/dry hands;Min guard Where Assessed - Grooming: Unsupported standing Lower Body Dressing: Performed;Set up Where Assessed - Lower Body Dressing: Unsupported sitting Toilet Transfer: Performed;Min guard Toilet Transfer Method: Sit to Loss adjuster, chartered: Regular height toilet;Grab bars Toileting - Clothing Manipulation and Hygiene: Performed;Supervision/safety Where Assessed - Best boy and Hygiene: Lean right and/or left Transfers/Ambulation Related to ADLs: cues for hand placment and tech. ADL Comments: agreeable to OT. dtr. present and kept panicing with all of her mother's initiations of movements and was hoovering had to ask her if she was okay or had any questions because it was very distracting to the pt. and un safe.  pt. doing great with bed mobility.   able to sit un supported eob and cross l/r leg over to don socks.  donned brace with set up.  all aspects of toileting min guard to s    OT Goals(current goals can now be found in the care plan section)    Visit Information  Last OT Received On: 04/29/13 History of Present Illness: L2-3 decompression. L3-4 fusion                 Cognition  Cognition Arousal/Alertness: Awake/alert Behavior During Therapy: WFL for tasks assessed/performed Overall Cognitive Status: Within Functional Limits for tasks assessed    Mobility  Bed Mobility Overal bed mobility: Needs Assistance General bed mobility comments: min guard/min a for correct log roll technique Transfers Overall transfer level: Needs assistance Equipment used: Rolling walker (2 wheeled) Transfers: Sit to/from Stand Sit to Stand: Min guard              End of Session OT - End of Session Equipment Utilized During Treatment: Back brace;Rolling walker Activity Tolerance: Patient tolerated treatment well Patient left: with family/visitor present;with nursing/sitter in room;Other (comment) (in b.room on bsc for bath with CNA)       Janice Coffin, COTA/L 04/29/2013, 4:17 PM

## 2013-04-29 NOTE — Progress Notes (Signed)
Physical Therapy Treatment Patient Details Name: Betty Jackson MRN: [id number] DOB: 30-Jun-1934 Today's Date: 04/29/2013 Time: 2694-8546 PT Time Calculation (min): 34 min  PT Assessment / Plan / Recommendation  History of Present Illness L2-3 decompression. L3-4 fusion   PT Comments   Patient progressing very well with overall mobility this session. Patient is planning to DC Monday per her report. Continue with current POC. Ok from PT standpoint to ambulate with family  Follow Up Recommendations  Home health PT;Supervision/Assistance - 24 hour     Does the patient have the potential to tolerate intense rehabilitation     Barriers to Discharge        Equipment Recommendations  3in1 (PT)    Recommendations for Other Services    Frequency Min 4X/week   Progress towards PT Goals Progress towards PT goals: Progressing toward goals  Plan Current plan remains appropriate    Precautions / Restrictions Precautions Precautions: Fall;Back Precaution Comments: Patient able to recall all precautions.  Required Braces or Orthoses: Spinal Brace Spinal Brace: Lumbar corset;Applied in sitting position   Pertinent Vitals/Pain no apparent distress        Mobility  Bed Mobility Rolling: Min assist Sidelying to sit: Min assist General bed mobility comments: A to ensure log roll technique. Cues for postioning Transfers Overall transfer level: Needs assistance Equipment used: Rolling walker (2 wheeled) Sit to Stand: Min guard General transfer comment: Cues for safe hand placement Ambulation/Gait Ambulation/Gait assistance: Min guard Ambulation Distance (Feet): 170 Feet Assistive device: Rolling walker (2 wheeled) Gait velocity: decreased General Gait Details: Cues for upright posture and to stand tall within RW    Exercises     PT Diagnosis:    PT Problem List:   PT Treatment Interventions:     PT Goals (current goals can now be found in the care plan section)    Visit  Information  Last PT Received On: 04/29/13 Assistance Needed: +1 History of Present Illness: L2-3 decompression. L3-4 fusion    Subjective Data      Cognition  Cognition Arousal/Alertness: Awake/alert Behavior During Therapy: WFL for tasks assessed/performed Overall Cognitive Status: Within Functional Limits for tasks assessed    Balance     End of Session PT - End of Session Equipment Utilized During Treatment: Gait belt;Back brace Activity Tolerance: Patient tolerated treatment well Patient left: in chair;with call bell/phone within reach   GP     Jacqualyn Posey 04/29/2013, 9:02 AM  04/29/2013 Jacqualyn Posey PTA [phone] pager [phone] office

## 2013-04-30 LAB — GLUCOSE, CAPILLARY
GLUCOSE-CAPILLARY: 119 mg/dL — AB (ref 70–99)
GLUCOSE-CAPILLARY: 94 mg/dL (ref 70–99)
Glucose-Capillary: 151 mg/dL — ABNORMAL HIGH (ref 70–99)
Glucose-Capillary: 157 mg/dL — ABNORMAL HIGH (ref 70–99)
Glucose-Capillary: 90 mg/dL (ref 70–99)

## 2013-04-30 MED ORDER — TEMAZEPAM 7.5 MG PO CAPS
15.0000 mg | ORAL_CAPSULE | Freq: Every evening | ORAL | Status: DC | PRN
  Administered 2013-04-30: 15 mg via ORAL
  Filled 2013-04-30: qty 2

## 2013-04-30 NOTE — Progress Notes (Signed)
Physical Therapy Treatment Patient Details Name: Betty Jackson MRN: [id number] DOB: [birth year] Today's Date: 04/30/2013 Time: 8341-9622 PT Time Calculation (min): 18 min  PT Assessment / Plan / Recommendation  History of Present Illness L2-3 decompression. L3-4 fusion   PT Comments   Patient progressing well with ambulation and mobility, anticipate patient will be safe for dc home with HHPT tomorrow.   Follow Up Recommendations  Home health PT;Supervision/Assistance - 24 hour           Equipment Recommendations  3in1 (PT)       Frequency Min 4X/week   Progress towards PT Goals Progress towards PT goals: Progressing toward goals  Plan Current plan remains appropriate    Precautions / Restrictions Precautions Precautions: Fall;Back Precaution Comments: Patient with good recall 3/3 today. Required Braces or Orthoses: Spinal Brace Spinal Brace: Lumbar corset;Applied in sitting position Restrictions Weight Bearing Restrictions: No   Pertinent Vitals/Pain 6/10    Mobility  Bed Mobility Overal bed mobility: Needs Assistance Bed Mobility: Sit to Sidelying Sit to sidelying: Supervision General bed mobility comments: Good technique, no physical assist required, simulated with flat bed and no rail Transfers Overall transfer level: Needs assistance Equipment used: Rolling walker (2 wheeled) Transfers: Sit to/from Stand Sit to Stand: Supervision General transfer comment: no physical assist required, good hand placement and compliance with precautions Ambulation/Gait Ambulation/Gait assistance: Supervision Ambulation Distance (Feet): 420 Feet Assistive device: Rolling walker (2 wheeled) Gait Pattern/deviations: Step-through pattern;Decreased stride length Gait velocity: decreased Gait velocity interpretation: Below normal speed for age/gender General Gait Details: improveing gait speed, and overall ambulation      PT Goals (current goals can now be found in the care plan  section) Acute Rehab PT Goals Patient Stated Goal: to go home tomorrow PT Goal Formulation: With patient Time For Goal Achievement: 05/03/13 Potential to Achieve Goals: Good  Visit Information  Last PT Received On: 04/30/13 Assistance Needed: +1 History of Present Illness: L2-3 decompression. L3-4 fusion    Subjective Data  Subjective: I feel like I am moving wekk compared to before the surgery Patient Stated Goal: to go home tomorrow   Cognition  Cognition Arousal/Alertness: Awake/alert Behavior During Therapy: WFL for tasks assessed/performed (Had been nauseated earlier in day) Overall Cognitive Status: Within Functional Limits for tasks assessed    Balance  Balance Overall balance assessment:  (improved)  End of Session PT - End of Session Equipment Utilized During Treatment: Gait belt;Back brace Activity Tolerance: Patient limited by fatigue Patient left: in bed;with call bell/phone within reach;with bed alarm set;with family/visitor present Nurse Communication: Mobility status   GP     Duncan Dull 04/30/2013, 2:33 PM Alben Deeds, [place] DPT  [phone]

## 2013-04-30 NOTE — Progress Notes (Signed)
Patient ID: Betty Jackson, female   DOB: [birth year], 79 y.o.   MRN: [id number] [name] continues to do fairly well pain seems to be well managed she's got some nausea today such that she's not rated a home neurologically she stable probable discharge tomorrow

## 2013-05-01 LAB — GLUCOSE, CAPILLARY
Glucose-Capillary: 92 mg/dL (ref 70–99)
Glucose-Capillary: 104 mg/dL — ABNORMAL HIGH (ref 70–99)

## 2013-05-01 MED ORDER — OXYCODONE-ACETAMINOPHEN 5-325 MG PO TABS: 1.0000 | ORAL_TABLET | ORAL | Status: DC | PRN

## 2013-05-01 MED ORDER — METHOCARBAMOL 500 MG PO TABS: 500.0000 mg | ORAL_TABLET | Freq: Four times a day (QID) | ORAL | Status: DC | PRN

## 2013-05-01 NOTE — Progress Notes (Signed)
Pt discharged home with family to have Home PT/OT set up with [place]. Equipments delivered in room prior to discharge.

## 2013-05-01 NOTE — Progress Notes (Addendum)
Occupational Therapy Treatment Patient Details Name: Betty Jackson MRN: [id number] DOB: [birth year] Today's Date: 05/01/2013 Time: 0630-1601 OT Time Calculation (min): 15 min  OT Assessment / Plan / Recommendation  History of present illness L2-3 decompression. L3-4 fusion   OT comments  Progressing towards goals. Education provided to pt and family present. Feel pt is safe to d/c home with family available to assist 24/7.   Follow Up Recommendations  Home health OT;Supervision/Assistance - 24 hour    Barriers to Discharge       Equipment Recommendations  3 in 1 bedside comode    Recommendations for Other Services    Frequency Min 2X/week   Progress towards OT Goals Progress towards OT goals: Progressing toward goals  Plan Discharge plan remains appropriate    Precautions / Restrictions Precautions Precautions: Fall;Back Precaution Comments: Reviewed precautions. Recalled 2/3 precautions independently. Required Braces or Orthoses: Spinal Brace Spinal Brace: Lumbar corset;Applied in sitting position Restrictions Weight Bearing Restrictions: No   Pertinent Vitals/Pain Pain 2/10. Repositioned.     ADL  Upper Body Dressing: Set up;Supervision/safety (back brace) Where Assessed - Upper Body Dressing: Unsupported sitting Toilet Transfer: Supervision/safety Toilet Transfer Method: Sit to Loss adjuster, chartered: Other (comment) (bed) Equipment Used: Back brace;Gait belt;Rolling walker Transfers/Ambulation Related to ADLs: Supervision. ADL Comments: Discussed use of bag on walker and recommended sitting for bathing/dressing. Recommended someone being with pt for shower transfer/bathing. Reviewed use of cups for teeth care and recommended having items on right side of sink to avoid breaking precautions. Recommended having rugs picked up in house (non skid in bathroom). Educated on toilet aid for hygiene. Discussed use of reacher. Pt states she is able to cross legs for LB  dressing. OT demonstrated shower transfer technique.   OT Diagnosis:    OT Problem List:   OT Treatment Interventions:     OT Goals(current goals can now be found in the care plan section) Acute Rehab OT Goals Patient Stated Goal: not stated OT Goal Formulation: With patient Time For Goal Achievement: 05/10/13 Potential to Achieve Goals: Good ADL Goals Pt Will Perform Lower Body Bathing: with min assist;with adaptive equipment;with caregiver independent in assisting;sit to/from stand Pt Will Perform Lower Body Dressing: with min assist;with caregiver independent in assisting;with adaptive equipment;sit to/from stand Pt Will Transfer to Toilet: with min guard assist;bedside commode;ambulating (with family assisting) Pt Will Perform Toileting - Clothing Manipulation and hygiene: with supervision;with adaptive equipment;sitting/lateral leans;sit to/from stand Pt Will Perform Tub/Shower Transfer: with min guard assist;ambulating;with caregiver independent in assisting;Shower transfer;shower seat Additional ADL Goal #1: Pt/family will donn/doff brace indepenently   Visit Information  Last OT Received On: 05/01/13 Assistance Needed: +1 History of Present Illness: L2-3 decompression. L3-4 fusion    Subjective Data      Prior Functioning       Cognition  Cognition Arousal/Alertness: Awake/alert Behavior During Therapy: WFL for tasks assessed/performed Overall Cognitive Status: Within Functional Limits for tasks assessed    Mobility  Bed Mobility Overal bed mobility: Needs Assistance Bed Mobility: Rolling;Sidelying to Sit;Sit to Sidelying Rolling: Supervision Sidelying to sit: Supervision Sit to sidelying: Supervision General bed mobility comments: cues to reinforce technique. Transfers Overall transfer level: Needs assistance Equipment used: Rolling walker (2 wheeled) Transfers: Sit to/from Stand Sit to Stand: Supervision General transfer comment: cues for hand placement.     Exercises      Balance    End of Session OT - End of Session Equipment Utilized During Treatment: Gait belt;Rolling walker;Back brace Activity Tolerance:  Patient tolerated treatment well Patient left: in bed;with call bell/phone within reach;with bed alarm set  [place], [place] L OTR/L [phone] 05/01/2013, 12:49 PM

## 2013-05-01 NOTE — Progress Notes (Signed)
-  Talked to patient about [place] choices, patient chose [place]; Mary with [facility] called for arrangements; Attending MD, please enter HHPT/OT at discharge; Aneta Mins [phone]

## 2013-05-01 NOTE — Progress Notes (Signed)
Physical Therapy Treatment Patient Details Name: Betty Jackson MRN: [id number] DOB: [birth year] Today's Date: 05/01/2013 Time: 9509-3267 PT Time Calculation (min): 39 min  PT Assessment / Plan / Recommendation  History of Present Illness L2-3 decompression. L3-4 fusion   PT Comments   Patient with increased ambulation today. Educated patient on technique with simulated car transfer. Spoke with patient and care-giver at [place] [facility] regarding questions and expectations for home mobility. Discussed pain management strategies, mobility, positioning, techniques for transfers, as well as appropriate furniture for sitting.  Patient did have some questions related to medications, deferred to MD and nsg.     Follow Up Recommendations  Home health PT;Supervision/Assistance - 24 hour           Equipment Recommendations  3in1 (PT)       Frequency Min 4X/week   Progress towards PT Goals Progress towards PT goals: Progressing toward goals  Plan Current plan remains appropriate    Precautions / Restrictions Precautions Precautions: Fall;Back Precaution Comments: Patient with good recall 3/3 today. Required Braces or Orthoses: Spinal Brace Spinal Brace: Lumbar corset;Applied in sitting position Restrictions Weight Bearing Restrictions: No   Pertinent Vitals/Pain 3/10    Mobility  Bed Mobility Overal bed mobility: Needs Assistance Bed Mobility: Sit to Sidelying Rolling: Supervision Sidelying to sit: Supervision Sit to sidelying: Supervision General bed mobility comments: Good technique, no physical assist required, simulated with flat bed and no rail Transfers Overall transfer level: Needs assistance Equipment used: Rolling walker (2 wheeled) Transfers: Sit to/from Stand Sit to Stand: Supervision General transfer comment: no physical assist required, good hand placement and compliance with precautions Ambulation/Gait Ambulation/Gait assistance: Supervision Ambulation Distance (Feet): 640  Feet Assistive device: Rolling walker (2 wheeled) Gait Pattern/deviations: Step-through pattern;Decreased stride length Gait velocity: decreased Stairs: Yes Stairs assistance: Supervision Stair Management: One rail Right;Step to pattern;Forwards Number of Stairs: 6 General stair comments: VCs for sequencing and technique with good compliance for precautions      PT Goals (current goals can now be found in the care plan section) Acute Rehab PT Goals Patient Stated Goal: to go home tomorrow PT Goal Formulation: With patient Time For Goal Achievement: 05/03/13 Potential to Achieve Goals: Good  Visit Information  Last PT Received On: 05/01/13 Assistance Needed: +1 History of Present Illness: L2-3 decompression. L3-4 fusion    Subjective Data  Subjective: I feel like I am moving wekk compared to before the surgery Patient Stated Goal: to go home tomorrow   Cognition  Cognition Arousal/Alertness: Awake/alert Behavior During Therapy: WFL for tasks assessed/performed (Had been nauseated earlier in day) Overall Cognitive Status: Within Functional Limits for tasks assessed    Balance  General Comments General comments (skin integrity, edema, etc.): increased ambulation today. Educated patient on technique with simulated car transfer. Spoke with patient and care-giver at [place] regarding questions and expectations for home mobility. Discussed pain management strategies, mobility, positioning, techniques for transfers, as well as appropriate furniture for sitting.  Patient did have some questions related to medications, deferred to MD and nsg.    End of Session PT - End of Session Equipment Utilized During Treatment: Gait belt;Back brace Activity Tolerance: Patient limited by fatigue Patient left: in bed;with call bell/phone within reach;with bed alarm set;with family/visitor present Nurse Communication: Mobility status   GP     Duncan Dull 05/01/2013, 8:53 AM Alben Deeds, PT  DPT  [phone]

## 2013-05-01 NOTE — Discharge Summary (Signed)
Physician Discharge Summary  Patient ID: Betty Jackson MRN: [id number] DOB/AGE: 1935-03-25 79 y.o.  Admit date: 04/25/2013 Discharge date: 05/01/2013  Admission Diagnoses: Lumbar spinal stenosis and scoliosis L2-L4  Discharge Diagnoses: Betty Jackson spinal stenosis and scoliosis L2-L4 Active Problems:   Lumbar stenosis   Discharged Condition: good  Hospital Course: Patient was admitted to undergo surgical decompression and stabilization from L2-L4. She had degenerative spondylolisthesis along with a scoliosis the cause severe stenosis. She had neurogenic claudication in addition she had a cauda equina syndrome and was beginning to have problems with bladder control. Postoperatively she is done well. Her incision is been clean and dry. She is ambulatory.  Consults: None  Significant Diagnostic Studies: None  Treatments: Decompression L2-L4. Posterior lumbar interbody arthrodesis with pedicle screw fixation L2-L4.  Discharge Exam: Blood pressure 125/55, pulse 84, temperature 98.3 F (36.8 C), temperature source Oral, resp. rate 18, height 5\' 4"  (1.626 m), weight 71.4 kg (157 lb 6.5 oz), SpO2 97.00%. Incision is clean and dry, motor function appears intact in both lower extremities. Gait are intact.  Disposition: 01-Home or Self Care  Discharge Orders   Future Orders Complete By Expires   Call MD for:  redness, tenderness, or signs of infection (pain, swelling, redness, odor or green/yellow discharge around incision site)  As directed    Call MD for:  severe uncontrolled pain  As directed    Call MD for:  temperature >100.4  As directed    Diet - low sodium heart healthy  As directed    Discharge instructions  As directed    Comments:     Okay to shower. Do not apply salves or appointments to incision. No heavy lifting with the upper extremities greater than 15 pounds. May resume driving when not requiring pain medication and patient feels comfortable with doing so.   Increase activity  slowly  As directed        Medication List         amLODipine 5 MG tablet  Commonly known as:  NORVASC  Take 5 mg by mouth daily.     atorvastatin 20 MG tablet  Commonly known as:  LIPITOR  Take 20 mg by mouth daily.     calcium carbonate 600 MG Tabs tablet  Commonly known as:  OS-CAL  Take 600 mg by mouth daily.     cholecalciferol 1000 UNITS tablet  Commonly known as:  VITAMIN D  Take 1,000 Units by mouth 2 (two) times daily.     clopidogrel 75 MG tablet  Commonly known as:  PLAVIX  Take 75 mg by mouth daily with breakfast.     gabapentin 300 MG capsule  Commonly known as:  NEURONTIN  Take 300 mg by mouth 4 (four) times daily.     glucose blood test strip  Commonly known as:  FREESTYLE LITE  - Patient testing BS once daily.  - Diag code 446.5     hydrochlorothiazide 12.5 MG capsule  Commonly known as:  MICROZIDE  Take 1 capsule (12.5 mg total) by mouth daily.     losartan 100 MG tablet  Commonly known as:  COZAAR  Take 1 tablet (100 mg total) by mouth daily.     methocarbamol 500 MG tablet  Commonly known as:  ROBAXIN  Take 1 tablet (500 mg total) by mouth every 6 (six) hours as needed for muscle spasms.     metoprolol tartrate 25 MG tablet  Commonly known as:  LOPRESSOR  Take 25 mg by  mouth 2 (two) times daily.     OVER THE COUNTER MEDICATION  Take 2 tablets by mouth daily. Juice Plus Vegetable     OVER THE COUNTER MEDICATION  Take 2 tablets by mouth daily. Juice Plus Fruit     OxyCODONE 10 mg T12a 12 hr tablet  Commonly known as:  OXYCONTIN  Take 10 mg by mouth every 12 (twelve) hours.     oxyCODONE-acetaminophen 10-325 MG per tablet  Commonly known as:  PERCOCET  Take 1 tablet by mouth every 4 (four) hours as needed for pain.     oxyCODONE-acetaminophen 5-325 MG per tablet  Commonly known as:  PERCOCET/ROXICET  Take 1-2 tablets by mouth every 4 (four) hours as needed for moderate pain.     predniSONE 5 MG tablet  Commonly known as:   DELTASONE  Take 2.5 mg by mouth daily with breakfast. Give with 10 mg (1/2 of 20 mg) to = 12.5 mg dose     predniSONE 20 MG tablet  Commonly known as:  DELTASONE  Take 10 mg by mouth daily with breakfast. Give with 2.5mg  (1/2 of 5 mg tablet to = 12.5 mg dose     temazepam 15 MG capsule  Commonly known as:  RESTORIL  Take 15 mg by mouth at bedtime.     vitamin C 500 MG tablet  Commonly known as:  ASCORBIC ACID  Take 1,000 mg by mouth daily as needed (for sore throat).         SignedEarleen Newport 05/01/2013, 3:38 PM

## 2013-05-02 DIAGNOSIS — R269 Unspecified abnormalities of gait and mobility: Secondary | ICD-10-CM | POA: Diagnosis not present

## 2013-05-02 DIAGNOSIS — IMO0001 Reserved for inherently not codable concepts without codable children: Secondary | ICD-10-CM | POA: Diagnosis not present

## 2013-05-02 DIAGNOSIS — M4800 Spinal stenosis, site unspecified: Secondary | ICD-10-CM | POA: Diagnosis not present

## 2013-05-02 DIAGNOSIS — M4180 Other forms of scoliosis, site unspecified: Secondary | ICD-10-CM | POA: Diagnosis not present

## 2013-05-02 DIAGNOSIS — Z4789 Encounter for other orthopedic aftercare: Secondary | ICD-10-CM | POA: Diagnosis not present

## 2013-05-03 DIAGNOSIS — M4180 Other forms of scoliosis, site unspecified: Secondary | ICD-10-CM | POA: Diagnosis not present

## 2013-05-03 DIAGNOSIS — R269 Unspecified abnormalities of gait and mobility: Secondary | ICD-10-CM | POA: Diagnosis not present

## 2013-05-03 DIAGNOSIS — Z4789 Encounter for other orthopedic aftercare: Secondary | ICD-10-CM | POA: Diagnosis not present

## 2013-05-03 DIAGNOSIS — IMO0001 Reserved for inherently not codable concepts without codable children: Secondary | ICD-10-CM | POA: Diagnosis not present

## 2013-05-03 DIAGNOSIS — M4800 Spinal stenosis, site unspecified: Secondary | ICD-10-CM | POA: Diagnosis not present

## 2013-05-05 DIAGNOSIS — R269 Unspecified abnormalities of gait and mobility: Secondary | ICD-10-CM | POA: Diagnosis not present

## 2013-05-05 DIAGNOSIS — M4800 Spinal stenosis, site unspecified: Secondary | ICD-10-CM | POA: Diagnosis not present

## 2013-05-05 DIAGNOSIS — M4180 Other forms of scoliosis, site unspecified: Secondary | ICD-10-CM | POA: Diagnosis not present

## 2013-05-05 DIAGNOSIS — IMO0001 Reserved for inherently not codable concepts without codable children: Secondary | ICD-10-CM | POA: Diagnosis not present

## 2013-05-05 DIAGNOSIS — Z4789 Encounter for other orthopedic aftercare: Secondary | ICD-10-CM | POA: Diagnosis not present

## 2013-05-08 DIAGNOSIS — IMO0001 Reserved for inherently not codable concepts without codable children: Secondary | ICD-10-CM | POA: Diagnosis not present

## 2013-05-08 DIAGNOSIS — Z4789 Encounter for other orthopedic aftercare: Secondary | ICD-10-CM | POA: Diagnosis not present

## 2013-05-08 DIAGNOSIS — R269 Unspecified abnormalities of gait and mobility: Secondary | ICD-10-CM | POA: Diagnosis not present

## 2013-05-08 DIAGNOSIS — M4180 Other forms of scoliosis, site unspecified: Secondary | ICD-10-CM | POA: Diagnosis not present

## 2013-05-08 DIAGNOSIS — M4800 Spinal stenosis, site unspecified: Secondary | ICD-10-CM | POA: Diagnosis not present

## 2013-05-10 DIAGNOSIS — R269 Unspecified abnormalities of gait and mobility: Secondary | ICD-10-CM | POA: Diagnosis not present

## 2013-05-10 DIAGNOSIS — M4180 Other forms of scoliosis, site unspecified: Secondary | ICD-10-CM | POA: Diagnosis not present

## 2013-05-10 DIAGNOSIS — IMO0001 Reserved for inherently not codable concepts without codable children: Secondary | ICD-10-CM | POA: Diagnosis not present

## 2013-05-10 DIAGNOSIS — M4800 Spinal stenosis, site unspecified: Secondary | ICD-10-CM | POA: Diagnosis not present

## 2013-05-10 DIAGNOSIS — Z4789 Encounter for other orthopedic aftercare: Secondary | ICD-10-CM | POA: Diagnosis not present

## 2013-05-10 NOTE — Telephone Encounter (Signed)
error 

## 2013-05-12 DIAGNOSIS — IMO0001 Reserved for inherently not codable concepts without codable children: Secondary | ICD-10-CM | POA: Diagnosis not present

## 2013-05-12 DIAGNOSIS — R269 Unspecified abnormalities of gait and mobility: Secondary | ICD-10-CM | POA: Diagnosis not present

## 2013-05-12 DIAGNOSIS — Z4789 Encounter for other orthopedic aftercare: Secondary | ICD-10-CM | POA: Diagnosis not present

## 2013-05-12 DIAGNOSIS — M4180 Other forms of scoliosis, site unspecified: Secondary | ICD-10-CM | POA: Diagnosis not present

## 2013-05-12 DIAGNOSIS — M4800 Spinal stenosis, site unspecified: Secondary | ICD-10-CM | POA: Diagnosis not present

## 2013-05-15 ENCOUNTER — Other Ambulatory Visit (INDEPENDENT_AMBULATORY_CARE_PROVIDER_SITE_OTHER): Payer: Medicare Other

## 2013-05-15 DIAGNOSIS — IMO0001 Reserved for inherently not codable concepts without codable children: Secondary | ICD-10-CM | POA: Diagnosis not present

## 2013-05-15 DIAGNOSIS — M4180 Other forms of scoliosis, site unspecified: Secondary | ICD-10-CM | POA: Diagnosis not present

## 2013-05-15 DIAGNOSIS — M316 Other giant cell arteritis: Secondary | ICD-10-CM

## 2013-05-15 DIAGNOSIS — Z79899 Other long term (current) drug therapy: Secondary | ICD-10-CM

## 2013-05-15 DIAGNOSIS — R269 Unspecified abnormalities of gait and mobility: Secondary | ICD-10-CM | POA: Diagnosis not present

## 2013-05-15 DIAGNOSIS — Z4789 Encounter for other orthopedic aftercare: Secondary | ICD-10-CM | POA: Diagnosis not present

## 2013-05-15 DIAGNOSIS — M4800 Spinal stenosis, site unspecified: Secondary | ICD-10-CM | POA: Diagnosis not present

## 2013-05-16 LAB — HIGH SENSITIVITY CRP: CRP HIGH SENSITIVITY: 3.7 mg/L — AB

## 2013-05-16 LAB — SEDIMENTATION RATE: Sed Rate: 14 mm/hr (ref 0–22)

## 2013-05-17 DIAGNOSIS — R269 Unspecified abnormalities of gait and mobility: Secondary | ICD-10-CM | POA: Diagnosis not present

## 2013-05-17 DIAGNOSIS — IMO0001 Reserved for inherently not codable concepts without codable children: Secondary | ICD-10-CM | POA: Diagnosis not present

## 2013-05-17 DIAGNOSIS — M4180 Other forms of scoliosis, site unspecified: Secondary | ICD-10-CM | POA: Diagnosis not present

## 2013-05-17 DIAGNOSIS — M4800 Spinal stenosis, site unspecified: Secondary | ICD-10-CM | POA: Diagnosis not present

## 2013-05-17 DIAGNOSIS — Z4789 Encounter for other orthopedic aftercare: Secondary | ICD-10-CM | POA: Diagnosis not present

## 2013-05-24 DIAGNOSIS — R269 Unspecified abnormalities of gait and mobility: Secondary | ICD-10-CM | POA: Diagnosis not present

## 2013-05-24 DIAGNOSIS — Z6829 Body mass index (BMI) 29.0-29.9, adult: Secondary | ICD-10-CM | POA: Diagnosis not present

## 2013-05-24 DIAGNOSIS — M545 Low back pain, unspecified: Secondary | ICD-10-CM | POA: Diagnosis not present

## 2013-05-24 DIAGNOSIS — IMO0001 Reserved for inherently not codable concepts without codable children: Secondary | ICD-10-CM | POA: Diagnosis not present

## 2013-05-24 DIAGNOSIS — Z4789 Encounter for other orthopedic aftercare: Secondary | ICD-10-CM | POA: Diagnosis not present

## 2013-05-24 DIAGNOSIS — M4180 Other forms of scoliosis, site unspecified: Secondary | ICD-10-CM | POA: Diagnosis not present

## 2013-05-24 DIAGNOSIS — M4120 Other idiopathic scoliosis, site unspecified: Secondary | ICD-10-CM | POA: Diagnosis not present

## 2013-05-24 DIAGNOSIS — M4800 Spinal stenosis, site unspecified: Secondary | ICD-10-CM | POA: Diagnosis not present

## 2013-05-26 DIAGNOSIS — Z4789 Encounter for other orthopedic aftercare: Secondary | ICD-10-CM | POA: Diagnosis not present

## 2013-05-26 DIAGNOSIS — IMO0001 Reserved for inherently not codable concepts without codable children: Secondary | ICD-10-CM | POA: Diagnosis not present

## 2013-05-26 DIAGNOSIS — R269 Unspecified abnormalities of gait and mobility: Secondary | ICD-10-CM | POA: Diagnosis not present

## 2013-05-26 DIAGNOSIS — M4180 Other forms of scoliosis, site unspecified: Secondary | ICD-10-CM | POA: Diagnosis not present

## 2013-05-26 DIAGNOSIS — M4800 Spinal stenosis, site unspecified: Secondary | ICD-10-CM | POA: Diagnosis not present

## 2013-05-29 DIAGNOSIS — IMO0001 Reserved for inherently not codable concepts without codable children: Secondary | ICD-10-CM | POA: Diagnosis not present

## 2013-05-29 DIAGNOSIS — R269 Unspecified abnormalities of gait and mobility: Secondary | ICD-10-CM | POA: Diagnosis not present

## 2013-05-29 DIAGNOSIS — M4800 Spinal stenosis, site unspecified: Secondary | ICD-10-CM | POA: Diagnosis not present

## 2013-05-29 DIAGNOSIS — Z4789 Encounter for other orthopedic aftercare: Secondary | ICD-10-CM | POA: Diagnosis not present

## 2013-05-29 DIAGNOSIS — M4180 Other forms of scoliosis, site unspecified: Secondary | ICD-10-CM | POA: Diagnosis not present

## 2013-05-30 DIAGNOSIS — H25019 Cortical age-related cataract, unspecified eye: Secondary | ICD-10-CM | POA: Diagnosis not present

## 2013-05-30 DIAGNOSIS — H2510 Age-related nuclear cataract, unspecified eye: Secondary | ICD-10-CM | POA: Diagnosis not present

## 2013-05-30 DIAGNOSIS — H353190 Nonexudative age-related macular degeneration, unspecified eye, stage unspecified: Secondary | ICD-10-CM | POA: Diagnosis not present

## 2013-05-30 DIAGNOSIS — H16149 Punctate keratitis, unspecified eye: Secondary | ICD-10-CM | POA: Diagnosis not present

## 2013-05-30 DIAGNOSIS — M316 Other giant cell arteritis: Secondary | ICD-10-CM | POA: Diagnosis not present

## 2013-05-30 DIAGNOSIS — H5340 Unspecified visual field defects: Secondary | ICD-10-CM | POA: Diagnosis not present

## 2013-05-31 DIAGNOSIS — M4800 Spinal stenosis, site unspecified: Secondary | ICD-10-CM | POA: Diagnosis not present

## 2013-05-31 DIAGNOSIS — M4180 Other forms of scoliosis, site unspecified: Secondary | ICD-10-CM | POA: Diagnosis not present

## 2013-05-31 DIAGNOSIS — Z4789 Encounter for other orthopedic aftercare: Secondary | ICD-10-CM | POA: Diagnosis not present

## 2013-05-31 DIAGNOSIS — IMO0001 Reserved for inherently not codable concepts without codable children: Secondary | ICD-10-CM | POA: Diagnosis not present

## 2013-05-31 DIAGNOSIS — R269 Unspecified abnormalities of gait and mobility: Secondary | ICD-10-CM | POA: Diagnosis not present

## 2013-06-05 ENCOUNTER — Other Ambulatory Visit (INDEPENDENT_AMBULATORY_CARE_PROVIDER_SITE_OTHER): Payer: Medicare Other

## 2013-06-05 DIAGNOSIS — Z4789 Encounter for other orthopedic aftercare: Secondary | ICD-10-CM | POA: Diagnosis not present

## 2013-06-05 DIAGNOSIS — IMO0001 Reserved for inherently not codable concepts without codable children: Secondary | ICD-10-CM | POA: Diagnosis not present

## 2013-06-05 DIAGNOSIS — M4180 Other forms of scoliosis, site unspecified: Secondary | ICD-10-CM | POA: Diagnosis not present

## 2013-06-05 DIAGNOSIS — M316 Other giant cell arteritis: Secondary | ICD-10-CM | POA: Diagnosis not present

## 2013-06-05 DIAGNOSIS — R269 Unspecified abnormalities of gait and mobility: Secondary | ICD-10-CM | POA: Diagnosis not present

## 2013-06-05 DIAGNOSIS — Z79899 Other long term (current) drug therapy: Secondary | ICD-10-CM | POA: Diagnosis not present

## 2013-06-05 DIAGNOSIS — M4800 Spinal stenosis, site unspecified: Secondary | ICD-10-CM | POA: Diagnosis not present

## 2013-06-05 LAB — SEDIMENTATION RATE: Sed Rate: 17 mm/hr (ref 0–22)

## 2013-06-05 LAB — HIGH SENSITIVITY CRP: CRP, High Sensitivity: 2.04 mg/L (ref 0.000–5.000)

## 2013-06-07 DIAGNOSIS — M4800 Spinal stenosis, site unspecified: Secondary | ICD-10-CM | POA: Diagnosis not present

## 2013-06-07 DIAGNOSIS — R269 Unspecified abnormalities of gait and mobility: Secondary | ICD-10-CM | POA: Diagnosis not present

## 2013-06-07 DIAGNOSIS — IMO0001 Reserved for inherently not codable concepts without codable children: Secondary | ICD-10-CM | POA: Diagnosis not present

## 2013-06-07 DIAGNOSIS — Z4789 Encounter for other orthopedic aftercare: Secondary | ICD-10-CM | POA: Diagnosis not present

## 2013-06-07 DIAGNOSIS — M4180 Other forms of scoliosis, site unspecified: Secondary | ICD-10-CM | POA: Diagnosis not present

## 2013-06-14 DIAGNOSIS — IMO0001 Reserved for inherently not codable concepts without codable children: Secondary | ICD-10-CM | POA: Diagnosis not present

## 2013-06-14 DIAGNOSIS — Z4789 Encounter for other orthopedic aftercare: Secondary | ICD-10-CM | POA: Diagnosis not present

## 2013-06-14 DIAGNOSIS — M4800 Spinal stenosis, site unspecified: Secondary | ICD-10-CM | POA: Diagnosis not present

## 2013-06-14 DIAGNOSIS — M4180 Other forms of scoliosis, site unspecified: Secondary | ICD-10-CM | POA: Diagnosis not present

## 2013-06-14 DIAGNOSIS — R269 Unspecified abnormalities of gait and mobility: Secondary | ICD-10-CM | POA: Diagnosis not present

## 2013-06-16 DIAGNOSIS — Z4789 Encounter for other orthopedic aftercare: Secondary | ICD-10-CM | POA: Diagnosis not present

## 2013-06-16 DIAGNOSIS — M4180 Other forms of scoliosis, site unspecified: Secondary | ICD-10-CM | POA: Diagnosis not present

## 2013-06-16 DIAGNOSIS — IMO0001 Reserved for inherently not codable concepts without codable children: Secondary | ICD-10-CM | POA: Diagnosis not present

## 2013-06-16 DIAGNOSIS — R269 Unspecified abnormalities of gait and mobility: Secondary | ICD-10-CM | POA: Diagnosis not present

## 2013-06-16 DIAGNOSIS — M4800 Spinal stenosis, site unspecified: Secondary | ICD-10-CM | POA: Diagnosis not present

## 2013-06-21 ENCOUNTER — Emergency Department (HOSPITAL_COMMUNITY)
Admission: EM | Admit: 2013-06-21 | Discharge: 2013-06-21 | Disposition: A | Discharge status: Home / Self Care | Payer: Medicare Other | Attending: Emergency Medicine | Admitting: Emergency Medicine

## 2013-06-21 ENCOUNTER — Emergency Department (HOSPITAL_COMMUNITY): Payer: Medicare Other

## 2013-06-21 ENCOUNTER — Encounter (HOSPITAL_COMMUNITY): Payer: Self-pay | Admitting: Emergency Medicine

## 2013-06-21 ENCOUNTER — Telehealth: Payer: Self-pay | Admitting: Gastroenterology

## 2013-06-21 DIAGNOSIS — M810 Age-related osteoporosis without current pathological fracture: Secondary | ICD-10-CM | POA: Insufficient documentation

## 2013-06-21 DIAGNOSIS — F411 Generalized anxiety disorder: Secondary | ICD-10-CM | POA: Insufficient documentation

## 2013-06-21 DIAGNOSIS — R110 Nausea: Secondary | ICD-10-CM | POA: Diagnosis not present

## 2013-06-21 DIAGNOSIS — Z87448 Personal history of other diseases of urinary system: Secondary | ICD-10-CM | POA: Insufficient documentation

## 2013-06-21 DIAGNOSIS — I2510 Atherosclerotic heart disease of native coronary artery without angina pectoris: Secondary | ICD-10-CM | POA: Insufficient documentation

## 2013-06-21 DIAGNOSIS — E872 Acidosis, unspecified: Secondary | ICD-10-CM | POA: Insufficient documentation

## 2013-06-21 DIAGNOSIS — Z96649 Presence of unspecified artificial hip joint: Secondary | ICD-10-CM | POA: Insufficient documentation

## 2013-06-21 DIAGNOSIS — Z79899 Other long term (current) drug therapy: Secondary | ICD-10-CM | POA: Insufficient documentation

## 2013-06-21 DIAGNOSIS — IMO0002 Reserved for concepts with insufficient information to code with codable children: Secondary | ICD-10-CM | POA: Insufficient documentation

## 2013-06-21 DIAGNOSIS — Z9889 Other specified postprocedural states: Secondary | ICD-10-CM | POA: Insufficient documentation

## 2013-06-21 DIAGNOSIS — Z7902 Long term (current) use of antithrombotics/antiplatelets: Secondary | ICD-10-CM | POA: Insufficient documentation

## 2013-06-21 DIAGNOSIS — R109 Unspecified abdominal pain: Secondary | ICD-10-CM

## 2013-06-21 DIAGNOSIS — Z7983 Long term (current) use of bisphosphonates: Secondary | ICD-10-CM | POA: Diagnosis not present

## 2013-06-21 DIAGNOSIS — Z8781 Personal history of (healed) traumatic fracture: Secondary | ICD-10-CM | POA: Insufficient documentation

## 2013-06-21 DIAGNOSIS — Z8619 Personal history of other infectious and parasitic diseases: Secondary | ICD-10-CM | POA: Diagnosis not present

## 2013-06-21 DIAGNOSIS — R1013 Epigastric pain: Secondary | ICD-10-CM | POA: Insufficient documentation

## 2013-06-21 DIAGNOSIS — F329 Major depressive disorder, single episode, unspecified: Secondary | ICD-10-CM | POA: Insufficient documentation

## 2013-06-21 DIAGNOSIS — G8929 Other chronic pain: Secondary | ICD-10-CM | POA: Diagnosis not present

## 2013-06-21 DIAGNOSIS — Z90710 Acquired absence of both cervix and uterus: Secondary | ICD-10-CM | POA: Insufficient documentation

## 2013-06-21 DIAGNOSIS — Z8719 Personal history of other diseases of the digestive system: Secondary | ICD-10-CM | POA: Insufficient documentation

## 2013-06-21 DIAGNOSIS — Z8669 Personal history of other diseases of the nervous system and sense organs: Secondary | ICD-10-CM | POA: Insufficient documentation

## 2013-06-21 DIAGNOSIS — M19049 Primary osteoarthritis, unspecified hand: Secondary | ICD-10-CM | POA: Diagnosis not present

## 2013-06-21 DIAGNOSIS — Z8709 Personal history of other diseases of the respiratory system: Secondary | ICD-10-CM | POA: Insufficient documentation

## 2013-06-21 DIAGNOSIS — Z9089 Acquired absence of other organs: Secondary | ICD-10-CM | POA: Diagnosis not present

## 2013-06-21 DIAGNOSIS — I4891 Unspecified atrial fibrillation: Secondary | ICD-10-CM | POA: Diagnosis not present

## 2013-06-21 DIAGNOSIS — Z9861 Coronary angioplasty status: Secondary | ICD-10-CM | POA: Diagnosis not present

## 2013-06-21 DIAGNOSIS — I10 Essential (primary) hypertension: Secondary | ICD-10-CM | POA: Insufficient documentation

## 2013-06-21 DIAGNOSIS — G4700 Insomnia, unspecified: Secondary | ICD-10-CM | POA: Diagnosis not present

## 2013-06-21 DIAGNOSIS — F3289 Other specified depressive episodes: Secondary | ICD-10-CM | POA: Diagnosis not present

## 2013-06-21 DIAGNOSIS — E785 Hyperlipidemia, unspecified: Secondary | ICD-10-CM | POA: Insufficient documentation

## 2013-06-21 DIAGNOSIS — G43909 Migraine, unspecified, not intractable, without status migrainosus: Secondary | ICD-10-CM | POA: Insufficient documentation

## 2013-06-21 DIAGNOSIS — K5730 Diverticulosis of large intestine without perforation or abscess without bleeding: Secondary | ICD-10-CM | POA: Diagnosis not present

## 2013-06-21 LAB — COMPREHENSIVE METABOLIC PANEL
ALK PHOS: 94 U/L (ref 39–117)
ALT: 29 U/L (ref 0–35)
AST: 26 U/L (ref 0–37)
Albumin: 4.3 g/dL (ref 3.5–5.2)
BUN: 12 mg/dL (ref 6–23)
CALCIUM: 9.6 mg/dL (ref 8.4–10.5)
CO2: 24 mEq/L (ref 19–32)
Chloride: 95 mEq/L — ABNORMAL LOW (ref 96–112)
Creatinine, Ser: 0.69 mg/dL (ref 0.50–1.10)
GFR calc Af Amer: >90 mL/min (ref >90)
GFR calc non Af Amer: 82 mL/min — ABNORMAL LOW (ref >90)
GLUCOSE: 108 mg/dL — AB (ref 70–99)
POTASSIUM: 3.6 meq/L — AB (ref 3.7–5.3)
Sodium: 134 mEq/L — ABNORMAL LOW (ref 137–147)
TOTAL PROTEIN: 7.7 g/dL (ref 6.0–8.3)
Total Bilirubin: 0.6 mg/dL (ref 0.3–1.2)

## 2013-06-21 LAB — URINALYSIS, ROUTINE W REFLEX MICROSCOPIC
BILIRUBIN URINE: NEGATIVE
GLUCOSE, UA: NEGATIVE mg/dL
KETONES UR: NEGATIVE mg/dL
Leukocytes, UA: NEGATIVE
Nitrite: NEGATIVE
PH: 6.5 (ref 5.0–8.0)
Protein, ur: NEGATIVE mg/dL
SPECIFIC GRAVITY, URINE: 1.005 (ref 1.005–1.030)
Urobilinogen, UA: 0.2 mg/dL (ref 0.0–1.0)

## 2013-06-21 LAB — LIPASE, BLOOD: Lipase: 36 U/L (ref 11–59)

## 2013-06-21 LAB — CBC WITH DIFFERENTIAL/PLATELET
Basophils Absolute: 0 10*3/uL (ref 0.0–0.1)
Basophils Relative: 0 % (ref 0–1)
EOS ABS: 0 10*3/uL (ref 0.0–0.7)
EOS PCT: 0 % (ref 0–5)
HCT: 40.2 % (ref 36.0–46.0)
Hemoglobin: 13.3 g/dL (ref 12.0–15.0)
LYMPHS ABS: 1.7 10*3/uL (ref 0.7–4.0)
Lymphocytes Relative: 11 % — ABNORMAL LOW (ref 12–46)
MCH: 28.8 pg (ref 26.0–34.0)
MCHC: 33.1 g/dL (ref 30.0–36.0)
MCV: 87 fL (ref 78.0–100.0)
MONO ABS: 0.8 10*3/uL (ref 0.1–1.0)
Monocytes Relative: 5 % (ref 3–12)
Neutro Abs: 13.3 10*3/uL — ABNORMAL HIGH (ref 1.7–7.7)
Neutrophils Relative %: 84 % — ABNORMAL HIGH (ref 43–77)
PLATELETS: 266 10*3/uL (ref 150–400)
RBC: 4.62 MIL/uL (ref 3.87–5.11)
RDW: 13.2 % (ref 11.5–15.5)
WBC: 15.8 10*3/uL — ABNORMAL HIGH (ref 4.0–10.5)

## 2013-06-21 LAB — URINE MICROSCOPIC-ADD ON

## 2013-06-21 LAB — I-STAT CG4 LACTIC ACID, ED
LACTIC ACID, VENOUS: 4.65 mmol/L — AB (ref 0.5–2.2)
LACTIC ACID, VENOUS: 1.75 mmol/L (ref 0.5–2.2)

## 2013-06-21 MED ORDER — OXYCODONE-ACETAMINOPHEN 5-325 MG PO TABS: 1.0000 | ORAL_TABLET | ORAL | Status: DC | PRN

## 2013-06-21 MED ORDER — GI COCKTAIL ~~LOC~~
30.0000 mL | Freq: Once | ORAL | Status: AC
  Administered 2013-06-21: 30 mL via ORAL
  Filled 2013-06-21: qty 30

## 2013-06-21 MED ORDER — SODIUM CHLORIDE 0.9 % IV BOLUS (SEPSIS)
1000.0000 mL | Freq: Once | INTRAVENOUS | Status: AC
  Administered 2013-06-21: 1000 mL via INTRAVENOUS

## 2013-06-21 MED ORDER — OXYCODONE-ACETAMINOPHEN 5-325 MG PO TABS
1.0000 | ORAL_TABLET | Freq: Once | ORAL | Status: AC
  Administered 2013-06-21: 1 via ORAL
  Filled 2013-06-21: qty 1

## 2013-06-21 MED ORDER — SODIUM CHLORIDE 0.9 % IV SOLN
Freq: Once | INTRAVENOUS | Status: AC
Start: 2013-06-21 — End: 2013-06-21
  Administered 2013-06-21: 19:00:00 via INTRAVENOUS

## 2013-06-21 MED ORDER — SUCRALFATE 1 G PO TABS: 1.0000 g | ORAL_TABLET | Freq: Three times a day (TID) | ORAL | Status: DC

## 2013-06-21 MED ORDER — IOHEXOL 350 MG/ML SOLN
100.0000 mL | Freq: Once | INTRAVENOUS | Status: AC | PRN
  Administered 2013-06-21: 100 mL via INTRAVENOUS

## 2013-06-21 NOTE — ED Provider Notes (Signed)
CSN: [id number]     Arrival date & time 06/21/13  1426 History   First MD Initiated Contact with Patient 06/21/13 1728     Chief Complaint  Patient presents with  . Abdominal Pain  . Nausea     (Consider location/radiation/quality/duration/timing/severity/associated sxs/prior Treatment) HPI Betty Jackson is a 79 y.o. female who presents to emergency department complaining of upper abdominal pain. States that her symptoms began yesterday. States the pain is epigastric area, does not radiate. Has associated nausea, no vomiting. Normal bowel movements. No urinary symptoms. No fever chills. She did not try to take anything for the pain. She states that she had a back surgery 2 months ago, and was on pain medications but states she has been off of him for few days. She states that her symptoms are very similar to the pain she had a year ago when she was hospitalized twice. At that time she was diagnosed with small bowel enteritis, was treated with antibiotics and pain medications. She had an endoscopy which showed a small hiatal hernia, and colonoscopy which showed diverticulosis but no diverticulitis. She had 2 CT scans that time both showing small bowel intestinal wall thickening, consistent with small bowel enteritis. Patient states his pain is very similar but not as severe. States that she is here early because she does not want pain to get as bad as last time. She did talk to her gastroenterologist, Dr. Fuller Plan, who has an appointment for her tomorrow, but told her if her pain continues to come to the emergency department.   Past Medical History  Diagnosis Date  . Hyperlipidemia     takes Lipitor daily  . Incontinence of urine   . Depression   . Scoliosis   . Vocal cord dysfunction     "they don't operate properly" (10/28/2012)  . Diverticulosis   . Hemorrhoids   . Enteritis   . Anxiety     "more so the last few days" (10/28/2012)  . Exertional shortness of breath     "every now and then"  (10/28/2012)  . Arthritis      Right hemiarthroplasty after hip fracture 06/2011  . DDD (degenerative disc disease)   . Arthritis     "hands" (10/28/2012)  . Chronic lower back pain     scoliosis  . Anxiety state, unspecified 10/28/2012  . Hypertension     takes Amlodipine,Losartan,Metoprolol,and HCTZ daily  . Insomnia     takes Restoril nightly  . Migraines     "back in my 20's; none since" (10/28/2012)  . Joint pain   . Osteopenia   . Osteoporosis   . Bruises easily     d/t being on prednisone and plavix  . History of scabies   . Diverticulitis     hx of  . Urinary frequency   . Cataract     immature on the left eye  . Complication of anesthesia     pt has a very high tolerance to meds  . CAD (coronary artery disease)     a. Stent RCA in [facility];  b. Cath approx 2009 - nonobs per pt report.  . Sinus bradycardia     a. on chronic bb  . PAF (paroxysmal atrial fibrillation)     a. lone epidode in 2011 according to notes.  . Temporal arteritis 2011    a. followed @ [facility]; potential flareup 10/28/2012/notes 10/28/2012  . Giant cell arteritis    Past Surgical History  Procedure Laterality  Date  . Bladder suspension  2001  . Hemiarthroplasty hip Right 2012  . Colonoscopy  01/26/2012    Procedure: COLONOSCOPY;  Surgeon: Ladene Artist, MD,FACG;  Location: [facility] ENDOSCOPY;  Service: Endoscopy;  Laterality: N/A;  note the EGD is possible  . Esophagogastroduodenoscopy  01/26/2012    Procedure: ESOPHAGOGASTRODUODENOSCOPY (EGD);  Surgeon: Ladene Artist, MD,FACG;  Location: [facility] ENDOSCOPY;  Service: Endoscopy;  Laterality: N/A;  . Cataract extraction w/ intraocular lens implant Right ~ 08/2012  . Temporal artery biopsy / ligation Bilateral 2011  . Tubal ligation  ~ 1982  . X-stop implantation  ~ 2010    "lower back" (10/28/2012)  . Abdominal hysterectomy  ~ 1984    vaginally  . Tonsillectomy and adenoidectomy      at age 64  . Laparoscopic cholecystectomy  2001  . Coronary  angioplasty with stent placement  2006  . Back surgery  7-83yrs ago    X Stop  . Breast biopsy Right   . Moles removed that required stiches      one on leg and one on face  . Epidural block injection     Family History  Problem Relation Age of Onset  . Cancer Daughter     breast  . Arthritis Neg Hx   . Heart disease Other   . Hypertension Other   . Heart attack Father   . Cancer Mother    History  Substance Use Topics  . Smoking status: Never Smoker   . Smokeless tobacco: Never Used  . Alcohol Use: 0.0 oz/week     Comment: socially   OB History   Grav Para Term Preterm Abortions TAB SAB Ect Mult Living                 Review of Systems  Constitutional: Negative for fever and chills.  Respiratory: Negative for cough, chest tightness and shortness of breath.   Cardiovascular: Negative for chest pain, palpitations and leg swelling.  Gastrointestinal: Positive for nausea and abdominal pain. Negative for vomiting and diarrhea.  Genitourinary: Negative for dysuria, flank pain and pelvic pain.  Musculoskeletal: Negative for arthralgias, myalgias, neck pain and neck stiffness.  Skin: Negative for rash.  Neurological: Negative for dizziness, weakness and headaches.  All other systems reviewed and are negative.      Allergies  Tussionex pennkinetic er  Home Medications   Current Outpatient Rx  Name  Route  Sig  Dispense  Refill  . alendronate (FOSAMAX) 70 MG tablet   Oral   Take 70 mg by mouth once a week. Each Tuesday. Take with a full glass of water on an empty stomach.         Marland Kitchen amLODipine (NORVASC) 5 MG tablet   Oral   Take 5 mg by mouth daily.         Marland Kitchen atorvastatin (LIPITOR) 20 MG tablet   Oral   Take 20 mg by mouth daily.         . calcium carbonate (OS-CAL) 600 MG TABS   Oral   Take 600 mg by mouth daily.         . cholecalciferol (VITAMIN D) 1000 UNITS tablet   Oral   Take 1,000 Units by mouth 2 (two) times daily.          . clopidogrel  (PLAVIX) 75 MG tablet   Oral   Take 75 mg by mouth daily with breakfast.         . hydrochlorothiazide (MICROZIDE)  12.5 MG capsule   Oral   Take 1 capsule (12.5 mg total) by mouth daily.   30 capsule   11   . losartan (COZAAR) 100 MG tablet   Oral   Take 1 tablet (100 mg total) by mouth daily.   30 tablet   11   . methocarbamol (ROBAXIN) 500 MG tablet   Oral   Take 1 tablet (500 mg total) by mouth every 6 (six) hours as needed for muscle spasms.   60 tablet   3   . metoprolol tartrate (LOPRESSOR) 25 MG tablet   Oral   Take 25 mg by mouth 2 (two) times daily.         Marland Kitchen OVER THE COUNTER MEDICATION   Oral   Take 2 tablets by mouth daily. Juice Plus Vegetable         . oxyCODONE-acetaminophen (PERCOCET) 10-325 MG per tablet   Oral   Take 1 tablet by mouth every 4 (four) hours as needed for pain.         Marland Kitchen oxyCODONE-acetaminophen (PERCOCET/ROXICET) 5-325 MG per tablet   Oral   Take 1-2 tablets by mouth every 4 (four) hours as needed for moderate pain.   60 tablet   0   . temazepam (RESTORIL) 15 MG capsule   Oral   Take 15 mg by mouth at bedtime.         . vitamin C (ASCORBIC ACID) 500 MG tablet   Oral   Take 1,000 mg by mouth daily as needed (for sore throat).           BP 158/74  Pulse 75  Temp(Src) 98.4 F (36.9 C) (Oral)  Resp 18  SpO2 97% Physical Exam  Nursing note and vitals reviewed. Constitutional: She appears well-developed and well-nourished. No distress.  HENT:  Head: Normocephalic.  Eyes: Conjunctivae are normal.  Neck: Neck supple.  Cardiovascular: Normal rate, regular rhythm and normal heart sounds.   Pulmonary/Chest: Effort normal and breath sounds normal. No respiratory distress. She has no wheezes. She has no rales.  Abdominal: Soft. Bowel sounds are normal. She exhibits no distension. There is tenderness. There is no rebound and no guarding.  Mild epigastric tenderness  Musculoskeletal: She exhibits no edema.  Neurological:  She is alert.  Skin: Skin is warm and dry.  Psychiatric: She has a normal mood and affect. Her behavior is normal.    ED Course  Procedures (including critical care time) Labs Review Labs Reviewed  CBC WITH DIFFERENTIAL - Abnormal; Notable for the following:    WBC 15.8 (*)    Neutrophils Relative % 84 (*)    Neutro Abs 13.3 (*)    Lymphocytes Relative 11 (*)    All other components within normal limits  COMPREHENSIVE METABOLIC PANEL - Abnormal; Notable for the following:    Sodium 134 (*)    Potassium 3.6 (*)    Chloride 95 (*)    Glucose, Bld 108 (*)    GFR calc non Af Amer 82 (*)    All other components within normal limits  URINALYSIS, ROUTINE W REFLEX MICROSCOPIC  LIPASE, BLOOD   Imaging Review Ct Angio Abd/pel W/ And/or W/o  06/21/2013   CLINICAL DATA:  Abdominal pain.  EXAM: CT ANGIOGRAPHY ABDOMEN AND PELVIS WITH CONTRAST AND WITHOUT CONTRAST  TECHNIQUE: Multidetector CT imaging of the abdomen and pelvis was performed using the standard protocol during bolus administration of intravenous contrast. Multiplanar reconstructed images and MIPs were obtained and reviewed to evaluate  the vascular anatomy.  CONTRAST:  167mL OMNIPAQUE IOHEXOL 350 MG/ML SOLN  COMPARISON:  CT of the abdomen and pelvis 05/05/2012.  FINDINGS: Lung Bases: Atherosclerotic calcifications in the right coronary artery.  Abdomen/Pelvis: The abdominal aorta is normal in caliber. Atherosclerosis throughout the abdominal aorta and the pelvic vasculature, without evidence of dissection. Single left renal artery. There is a dominant right renal artery and a tiny accessory artery to the lower pole of the right kidney. The celiac axis, superior mesenteric artery and inferior mesenteric artery are all widely patent without evidence of hemodynamically significant stenosis.  Status post cholecystectomy. Small calcification in the liver is compatible with a calcified granuloma. 5 mm low attenuation lesion in the tail of the  pancreas (image 34 of series 4), unchanged in retrospect compared to the prior study from 05/05/2012, presumably a benign lesion such as a small pseudocyst. Small calcified granuloma in the anterior aspect of the spleen. The appearance of the adrenal glands and kidneys bilaterally is unremarkable.  Numerous colonic diverticulae are noted, without surrounding inflammatory changes to suggest an acute diverticulitis at this time. Normal appendix. No significant volume of ascites. No pneumoperitoneum. No pathologic distention of small bowel. No definite lymphadenopathy identified within the abdomen or pelvis. Status post hysterectomy. Ovaries are not confidently identified may be surgically absent or atrophic. Urinary bladder is unremarkable in appearance.  Musculoskeletal: Status post PLIF from L2-L4 with interbody grafts at the L2-L3 and L3-L4 interspaces. There are no aggressive appearing lytic or blastic lesions noted in the visualized portions of the skeleton. Status post right total hip arthroplasty.  Review of the MIP images confirms the above findings.  IMPRESSION: 1. No acute findings in the abdomen or pelvis to account for the patient's symptoms. 2. Mild atherosclerosis throughout the abdominal and pelvic vasculature, without evidence of aneurysm, dissection or hemodynamically significant stenosis. There is also right coronary artery atherosclerosis. 3. Normal appendix. 4. Mild colonic diverticulosis without findings to suggest acute diverticulitis at this time. 5. Postoperative changes and additional incidental findings, as above, similar prior studies.   Electronically Signed   By: Vinnie Langton M.D.   On: 06/21/2013 22:22     EKG Interpretation None      MDM   Final diagnoses:  Abdominal pain    Pt with epigastric pain, nausea. No fever, no vomiting, no changes in bowels. Abdomin minimally tender only in epigastric area. Discussed treatment plan with pt and her daughter. Will get labs, ua,  try gi cocktail. Reviewed history, admission x2 in sept and oct 2014 for the same pain, at that time CT showed small bowel enteritis.    Lactic acid elevated at 4.65. Abdomen now soft, non tender. Spoke with dr. Fuller Plan, given elevated lactic acid and abdominal pain will get CT angio abd/pelvis. Pt apparently refused further testing after being diagnosed with enteritis last year. Dr. Fuller Plan unsure what the cause of her enteritis was.   Pt's CT abd pelvis is negative. Repeated lactic acid after IV fluids and it is 2.7. Pt continues to be pain free. She is tolerating PO fluids and crackers in ED. Home with close follow up. Pt has an apt tomorrow with GI.   Filed Vitals:   06/21/13 1443 06/21/13 1810  BP: 158/74 162/70  Pulse: 75 82  Temp: 98.4 F (36.9 C)   TempSrc: Oral   Resp: 18 16  SpO2: 97% 98%      Kennedy Brines A Keyaira Clapham, PA-C 06/22/13 0104

## 2013-06-21 NOTE — Discharge Instructions (Signed)
CT did not show any reason for your pain. Please follow up with your doctor for recheck tomorrow or as soon as able. Carafate as prescribed. Percocet for pain and headache. Return if worsening.    Abdominal Pain, Adult Many things can cause abdominal pain. Usually, abdominal pain is not caused by a disease and will improve without treatment. It can often be observed and treated at home. Your health care provider will do a physical exam and possibly order blood tests and X-rays to help determine the seriousness of your pain. However, in many cases, more time must pass before a clear cause of the pain can be found. Before that point, your health care provider may not know if you need more testing or further treatment. HOME CARE INSTRUCTIONS  Monitor your abdominal pain for any changes. The following actions may help to alleviate any discomfort you are experiencing:  Only take over-the-counter or prescription medicines as directed by your health care provider.  Do not take laxatives unless directed to do so by your health care provider.  Try a clear liquid diet (broth, tea, or water) as directed by your health care provider. Slowly move to a bland diet as tolerated. SEEK MEDICAL CARE IF:  You have unexplained abdominal pain.  You have abdominal pain associated with nausea or diarrhea.  You have pain when you urinate or have a bowel movement.  You experience abdominal pain that wakes you in the night.  You have abdominal pain that is worsened or improved by eating food.  You have abdominal pain that is worsened with eating fatty foods. SEEK IMMEDIATE MEDICAL CARE IF:   Your pain does not go away within 2 hours.  You have a fever.  You keep throwing up (vomiting).  Your pain is felt only in portions of the abdomen, such as the right side or the left lower portion of the abdomen.  You pass bloody or black tarry stools. MAKE SURE YOU:  Understand these instructions.   Will watch  your condition.   Will get help right away if you are not doing well or get worse.  Document Released: 12/24/2004 Document Revised: 01/04/2013 Document Reviewed: 11/23/2012 [facility] Patient Information 2014 [place].

## 2013-06-21 NOTE — Telephone Encounter (Signed)
Agree 

## 2013-06-21 NOTE — ED Notes (Signed)
Pt c/o abd pain and nausea x 1 day. Pt had not vomited but felt like she needed to. Pt NAD

## 2013-06-21 NOTE — Telephone Encounter (Signed)
Patient's daughter reports that she is having terrible abdominal pain that started yesterday.  Patient was hospitalized x 2 last year for the same type pain.  She had distal small bowel enteritis.  She is not having nausea, vomiting or fever at this time, but her daughter reports that she is "faint from the pain".  Daughter would like her seen in the office today.  She is advised that I don't have any openings today, and can't see her tomorrow until 1:30.  Daughter is advised to take her to the ER if the pain is causing her to faint or she develops new symptoms.  Patient wants to see "what the next few hours are going to be like and if not better will go to the ER".  Appt is scheduled with Alonza Bogus, PA tomorrow at 1:30.  They will call and cancel if they go to the ER and are admitted.

## 2013-06-21 NOTE — ED Notes (Signed)
PA at bedside Pt alert and oriented x4. Respirations even and unlabored, bilateral symmetrical rise and fall of chest. Skin warm and dry. In no acute distress. Denies needs.   

## 2013-06-21 NOTE — ED Provider Notes (Signed)
Patient reports she has been admitted twice before for abdominal pain and was found to have enteritis of the small bowel and colitis. She reports she started getting epigastric abdominal pain yesterday. She was able to sleep during the night however the pain persists today. She describes the pain as dull. She has had nausea without vomiting or diarrhea. She denies chest pain, shortness of breath. She states this pain is exactly like the pain she had before. She relates has appointment tomorrow with her GI doctor.  Patient indicates her epigastric area as the location of her pain. Patient states her pain is almost gone after getting a GI cocktail.  We discussed patient's laboratory results including the elevated lactic acid. Patient just wants to leave and keep her appointment tomorrow. However her daughter feels that she should stay get further evaluation and has convinced her mother to stay..  Medical screening examination/treatment/procedure(s) were conducted as a shared visit with non-physician practitioner(s) and myself.  I personally evaluated the patient during the encounter.   EKG Interpretation None       Betty Porter, MD, Abram Sander   Janice Norrie, MD 06/21/13 2126

## 2013-06-22 ENCOUNTER — Telehealth: Payer: Self-pay | Admitting: *Deleted

## 2013-06-22 ENCOUNTER — Encounter: Payer: Self-pay | Admitting: Gastroenterology

## 2013-06-22 ENCOUNTER — Ambulatory Visit (INDEPENDENT_AMBULATORY_CARE_PROVIDER_SITE_OTHER): Payer: Medicare Other | Admitting: Gastroenterology

## 2013-06-22 VITALS — BP 124/72 | HR 76 | Ht 64.5 in | Wt 156.1 lb

## 2013-06-22 DIAGNOSIS — R1013 Epigastric pain: Secondary | ICD-10-CM

## 2013-06-22 MED ORDER — PANTOPRAZOLE SODIUM 40 MG PO TBEC
40.0000 mg | DELAYED_RELEASE_TABLET | Freq: Every day | ORAL | Status: DC
Start: 2013-06-22 — End: 2013-06-23

## 2013-06-22 NOTE — Telephone Encounter (Signed)
Via fax from [facility], prior authorization needs to be done for patient's Pantoprazole. Number is [id number]. Form was faxed, filled out and faxed back.

## 2013-06-22 NOTE — Patient Instructions (Signed)
We sent a prescription to [facility] for Pantoprazole Sodium 40 mg.  Take 1 daly in the Am. Take the Carafate prescription, Take 1 gram with meals and at bedtime.  ( 4 times a day.)  We made you a follow up appointment with Dr. Fuller Plan on 5-26 -2015 at 2:15 PM.

## 2013-06-22 NOTE — Progress Notes (Signed)
Reviewed and agree with management plan. Agree with staying on a PPI daily long term, even if she remains asymptomatic, with a 1 month trial of Carafate.  Pricilla Riffle. Fuller Plan, MD [facility]

## 2013-06-22 NOTE — Progress Notes (Signed)
06/22/2013 Betty Jackson [id number] [birth year]   History of Present Illness:  Patient is a 79 -year-old female who we saw in September 2013 (Dr. Fuller Plan) when hospitalized with acute upper abdominal pain, nausea, and vomiting. CT scan showed distal small bowel enteritis.Her symptoms improved with conservative measures. Patient was readmitted late October 2013 with recurrent abdominal pain, nausea, and vomiting. Repeat CT scan showed improving small bowel enteritis, residual colonic wall thickening. Colonoscopy that admission revealed mild diverticulosis of the sigmoid and descending colon, moderate internal and external hemorrhoids. Terminal ileum was normal. EGD was done and normal except for small hiatal hernia. She was scheduled for hospital followup appointment 11/6 but did not show. She was then seen in the office by our NP on 05/03/2012 with complaints of recurrent abdominal pain but no nausea or vomiting.  CTA was ordered and was unremarkable, specifically for findings to suggest acute or chronic mesenteric ischemia.  She never returned for follow-up after that.  She comes to our office today stating that she started with the same pain again 2 days ago.  Pain was in her epigastrium and associated with some nausea, but no vomiting.  Was given this appointment for today, but pain got worse and she went to the ED last night.  CTA of the abdomen and pelvis were once again unremarkable.  Lipase and other labs were unremarkable except for an elevated lactic acid at 4.65, which returned to normal after a couple of hours and administration of IVF's.  (She also had a leukocytosis, but this has been elevated over the past several months.)  She was given a GI cocktail and was tolerating PO fluids and crackers in the ED so was discharged home, but told to keep this appt today.  She was also given a prescription for carafate, but has not yet picked it up at the pharmacy.  Today she is pain free.  Says that this pain felt  the same as the other episodes in the past.  She is not currently on PPI therapy. She says that there are no issues with her bowels.    Current Medications, Allergies, Past Medical History, Past Surgical History, Family History and Social History were reviewed in [employer] record.   Physical Exam: BP 124/72  Pulse 76  Ht 5' 4.5" (1.638 m)  Wt 156 lb 2 oz (70.818 kg)  BMI 26.39 kg/m2 General: Well developed white female in no acute distress Head: Normocephalic and atraumatic Eyes:  Sclerae anicteric, conjunctiva pink  Ears: Normal auditory acuity Lungs: Clear throughout to auscultation Heart: Regular rate and rhythm Abdomen: Soft, non-distended.  Normal bowel sounds.  Non-tender. Musculoskeletal: Symmetrical with no gross deformities  Extremities: No edema  Neurological: Alert oriented x 4, grossly non-focal Psychological:  Alert and cooperative. Normal mood and affect  Assessment and Recommendations: -Epigastric abdominal pain:  This is the third episode of similar pain that she's experienced in the past three years.  Evaluation has been relatively unremarkable/unrevealing, especially most recently.  ? Related to GERD/gastritis/esophagitis.  Pain now resolved.  Will start her back on PPI therapy once a day.  She was on these medication in the past, but has not been on them anytime recently.  She was given carafate prescription in the ED and will get that filled and take it for one month as well.  Follow-up with Dr. Fuller Plan in 8 weeks to check on status.

## 2013-06-23 ENCOUNTER — Telehealth: Payer: Self-pay | Admitting: Gastroenterology

## 2013-06-23 ENCOUNTER — Encounter: Payer: Self-pay | Admitting: Family Medicine

## 2013-06-23 ENCOUNTER — Ambulatory Visit (INDEPENDENT_AMBULATORY_CARE_PROVIDER_SITE_OTHER): Payer: Medicare Other | Admitting: Family Medicine

## 2013-06-23 VITALS — BP 128/82 | HR 88 | Wt 157.0 lb

## 2013-06-23 DIAGNOSIS — F329 Major depressive disorder, single episode, unspecified: Secondary | ICD-10-CM | POA: Diagnosis not present

## 2013-06-23 DIAGNOSIS — Z79899 Other long term (current) drug therapy: Secondary | ICD-10-CM

## 2013-06-23 DIAGNOSIS — M316 Other giant cell arteritis: Secondary | ICD-10-CM | POA: Diagnosis not present

## 2013-06-23 LAB — HIGH SENSITIVITY CRP: CRP HIGH SENSITIVITY: 1.7 mg/L (ref 0.000–5.000)

## 2013-06-23 LAB — SEDIMENTATION RATE: Sed Rate: 14 mm/hr (ref 0–22)

## 2013-06-23 MED ORDER — ESCITALOPRAM OXALATE 10 MG PO TABS: 10.0000 mg | ORAL_TABLET | Freq: Every day | ORAL | Status: DC

## 2013-06-23 MED ORDER — DEXLANSOPRAZOLE 60 MG PO CPDR: 60.0000 mg | DELAYED_RELEASE_CAPSULE | Freq: Every day | ORAL | Status: DC

## 2013-06-23 NOTE — Progress Notes (Signed)
Pre visit review using our clinic review tool, if applicable. No additional management support is needed unless otherwise documented below in the visit note. 

## 2013-06-23 NOTE — Patient Instructions (Signed)

## 2013-06-23 NOTE — Progress Notes (Signed)
Subjective:    Patient ID: Betty Jackson, female    DOB: 1935/03/27, 79 y.o.   MRN: [id number]  HPI Patient seen with concern for depression. She states she has had several weeks and possibly months of depressed mood. She's had frequent crying spells. Decreased motivation. Early-morning awakening. No suicidal thoughts. Appetite has been fair. She had similar episode of depression back in [place] several years ago. She cannot recall for sure but thinks she was treated with Prozac.  She had recent episode of recurrent epigastric pain. She's had extensive workup including recent CT angiogram of the abdomen which did not show any ischemic issues. Those symptoms are improved. She was placed on protonix and Carafate and she has not started on these yet.  Past Medical History  Diagnosis Date  . Hyperlipidemia     takes Lipitor daily  . Incontinence of urine   . Depression   . Scoliosis   . Vocal cord dysfunction     "they don't operate properly" (10/28/2012)  . Diverticulosis   . Hemorrhoids   . Enteritis   . Anxiety     "more so the last few days" (10/28/2012)  . Exertional shortness of breath     "every now and then" (10/28/2012)  . Arthritis      Right hemiarthroplasty after hip fracture 06/2011  . DDD (degenerative disc disease)   . Arthritis     "hands" (10/28/2012)  . Chronic lower back pain     scoliosis  . Anxiety state, unspecified 10/28/2012  . Hypertension     takes Amlodipine,Losartan,Metoprolol,and HCTZ daily  . Insomnia     takes Restoril nightly  . Migraines     "back in my 20's; none since" (10/28/2012)  . Joint pain   . Osteopenia   . Osteoporosis   . Bruises easily     d/t being on prednisone and plavix  . History of scabies   . Diverticulitis     hx of  . Urinary frequency   . Cataract     immature on the left eye  . Complication of anesthesia     pt has a very high tolerance to meds  . CAD (coronary artery disease)     a. Stent RCA in [facility];  b. Cath  approx 2009 - nonobs per pt report.  . Sinus bradycardia     a. on chronic bb  . PAF (paroxysmal atrial fibrillation)     a. lone epidode in 2011 according to notes.  . Temporal arteritis 2011    a. followed @ [facility]; potential flareup 10/28/2012/notes 10/28/2012  . Giant cell arteritis    Past Surgical History  Procedure Laterality Date  . Bladder suspension  2001  . Hemiarthroplasty hip Right 2012  . Colonoscopy  01/26/2012    Procedure: COLONOSCOPY;  Surgeon: Ladene Artist, MD,FACG;  Location: [facility] ENDOSCOPY;  Service: Endoscopy;  Laterality: N/A;  note the EGD is possible  . Esophagogastroduodenoscopy  01/26/2012    Procedure: ESOPHAGOGASTRODUODENOSCOPY (EGD);  Surgeon: Ladene Artist, MD,FACG;  Location: [facility] ENDOSCOPY;  Service: Endoscopy;  Laterality: N/A;  . Cataract extraction w/ intraocular lens implant Right ~ 08/2012  . Temporal artery biopsy / ligation Bilateral 2011  . Tubal ligation  ~ 1982  . X-stop implantation  ~ 2010    "lower back" (10/28/2012)  . Abdominal hysterectomy  ~ 1984    vaginally  . Tonsillectomy and adenoidectomy      at age 61  . Laparoscopic  cholecystectomy  2001  . Coronary angioplasty with stent placement  2006  . Back surgery  7-38yrs ago    X Stop  . Breast biopsy Right   . Moles removed that required stiches      one on leg and one on face  . Epidural block injection    . Lumbar fusion  03/2013    reports that she has never smoked. She has never used smokeless tobacco. She reports that she drinks alcohol. She reports that she does not use illicit drugs. family history includes Cancer in her daughter and mother; Heart attack in her father; Heart disease in her other; Hypertension in her other. There is no history of Arthritis. Allergies  Allergen Reactions  . Tussionex Pennkinetic Er [Hydrocod Polst-Cpm Polst Er] Hives    Only if has the DM in it      Review of Systems  Constitutional: Negative for fever, chills, diaphoresis, appetite change  and unexpected weight change.  Respiratory: Negative for cough and shortness of breath.   Cardiovascular: Negative for chest pain.  Neurological: Negative for headaches.  Psychiatric/Behavioral: Positive for sleep disturbance and dysphoric mood. Negative for suicidal ideas, hallucinations, confusion and agitation. The patient is nervous/anxious.        Objective:   Physical Exam  Constitutional: She is oriented to person, place, and time. She appears well-developed and well-nourished.  Neck: Neck supple. No thyromegaly present.  Cardiovascular: Normal rate.   Pulmonary/Chest: Effort normal and breath sounds normal. No respiratory distress. She has no wheezes. She has no rales.  Neurological: She is alert and oriented to person, place, and time. No cranial nerve deficit.  Psychiatric: Judgment and thought content normal.  Patient crying off and on during exam.          Assessment & Plan:  Maj. depressive episode. We discussed possible counseling. Start Lexapro 10 mg once daily. Reassess 3 weeks. Over 25 minutes spent assessing patient.

## 2013-06-23 NOTE — ED Provider Notes (Signed)
See prior note   Janice Norrie, MD 06/23/13 2200

## 2013-06-23 NOTE — Telephone Encounter (Signed)
Dexilant sent 

## 2013-06-24 ENCOUNTER — Emergency Department (HOSPITAL_BASED_OUTPATIENT_CLINIC_OR_DEPARTMENT_OTHER)
Admission: EM | Admit: 2013-06-24 | Discharge: 2013-06-24 | Disposition: A | Discharge status: Home / Self Care | Payer: Medicare Other | Attending: Emergency Medicine | Admitting: Emergency Medicine

## 2013-06-24 ENCOUNTER — Encounter (HOSPITAL_BASED_OUTPATIENT_CLINIC_OR_DEPARTMENT_OTHER): Payer: Self-pay | Admitting: Emergency Medicine

## 2013-06-24 DIAGNOSIS — M810 Age-related osteoporosis without current pathological fracture: Secondary | ICD-10-CM | POA: Diagnosis not present

## 2013-06-24 DIAGNOSIS — I2510 Atherosclerotic heart disease of native coronary artery without angina pectoris: Secondary | ICD-10-CM | POA: Diagnosis not present

## 2013-06-24 DIAGNOSIS — Z8669 Personal history of other diseases of the nervous system and sense organs: Secondary | ICD-10-CM | POA: Diagnosis not present

## 2013-06-24 DIAGNOSIS — E785 Hyperlipidemia, unspecified: Secondary | ICD-10-CM | POA: Insufficient documentation

## 2013-06-24 DIAGNOSIS — G4700 Insomnia, unspecified: Secondary | ICD-10-CM | POA: Insufficient documentation

## 2013-06-24 DIAGNOSIS — F411 Generalized anxiety disorder: Secondary | ICD-10-CM | POA: Insufficient documentation

## 2013-06-24 DIAGNOSIS — I4891 Unspecified atrial fibrillation: Secondary | ICD-10-CM | POA: Insufficient documentation

## 2013-06-24 DIAGNOSIS — I10 Essential (primary) hypertension: Secondary | ICD-10-CM | POA: Insufficient documentation

## 2013-06-24 DIAGNOSIS — E876 Hypokalemia: Secondary | ICD-10-CM | POA: Diagnosis not present

## 2013-06-24 DIAGNOSIS — F329 Major depressive disorder, single episode, unspecified: Secondary | ICD-10-CM | POA: Insufficient documentation

## 2013-06-24 DIAGNOSIS — F3289 Other specified depressive episodes: Secondary | ICD-10-CM | POA: Diagnosis not present

## 2013-06-24 DIAGNOSIS — M19049 Primary osteoarthritis, unspecified hand: Secondary | ICD-10-CM | POA: Insufficient documentation

## 2013-06-24 DIAGNOSIS — Z90710 Acquired absence of both cervix and uterus: Secondary | ICD-10-CM | POA: Diagnosis not present

## 2013-06-24 DIAGNOSIS — Z9889 Other specified postprocedural states: Secondary | ICD-10-CM | POA: Diagnosis not present

## 2013-06-24 DIAGNOSIS — G43909 Migraine, unspecified, not intractable, without status migrainosus: Secondary | ICD-10-CM | POA: Insufficient documentation

## 2013-06-24 DIAGNOSIS — Z7902 Long term (current) use of antithrombotics/antiplatelets: Secondary | ICD-10-CM | POA: Diagnosis not present

## 2013-06-24 DIAGNOSIS — Z79899 Other long term (current) drug therapy: Secondary | ICD-10-CM | POA: Diagnosis not present

## 2013-06-24 DIAGNOSIS — R1013 Epigastric pain: Secondary | ICD-10-CM | POA: Insufficient documentation

## 2013-06-24 DIAGNOSIS — Z872 Personal history of diseases of the skin and subcutaneous tissue: Secondary | ICD-10-CM | POA: Insufficient documentation

## 2013-06-24 LAB — COMPREHENSIVE METABOLIC PANEL
ALT: 23 U/L (ref 0–35)
AST: 24 U/L (ref 0–37)
Albumin: 3.9 g/dL (ref 3.5–5.2)
Alkaline Phosphatase: 83 U/L (ref 39–117)
BUN: 7 mg/dL (ref 6–23)
CALCIUM: 9 mg/dL (ref 8.4–10.5)
CO2: 28 meq/L (ref 19–32)
CREATININE: 0.6 mg/dL (ref 0.50–1.10)
Chloride: 93 mEq/L — ABNORMAL LOW (ref 96–112)
GFR, EST NON AFRICAN AMERICAN: 86 mL/min — AB (ref >90)
GLUCOSE: 101 mg/dL — AB (ref 70–99)
Potassium: 3.1 mEq/L — ABNORMAL LOW (ref 3.7–5.3)
Sodium: 133 mEq/L — ABNORMAL LOW (ref 137–147)
Total Bilirubin: 0.7 mg/dL (ref 0.3–1.2)
Total Protein: 7.1 g/dL (ref 6.0–8.3)

## 2013-06-24 LAB — CBC WITH DIFFERENTIAL/PLATELET
BASOS ABS: 0 10*3/uL (ref 0.0–0.1)
Basophils Relative: 0 % (ref 0–1)
EOS PCT: 1 % (ref 0–5)
Eosinophils Absolute: 0.1 10*3/uL (ref 0.0–0.7)
HCT: 37.2 % (ref 36.0–46.0)
Hemoglobin: 12.8 g/dL (ref 12.0–15.0)
LYMPHS ABS: 2.2 10*3/uL (ref 0.7–4.0)
Lymphocytes Relative: 24 % (ref 12–46)
MCH: 29.4 pg (ref 26.0–34.0)
MCHC: 34.4 g/dL (ref 30.0–36.0)
MCV: 85.5 fL (ref 78.0–100.0)
MONO ABS: 0.8 10*3/uL (ref 0.1–1.0)
Monocytes Relative: 9 % (ref 3–12)
Neutro Abs: 6.2 10*3/uL (ref 1.7–7.7)
Neutrophils Relative %: 66 % (ref 43–77)
Platelets: 231 10*3/uL (ref 150–400)
RBC: 4.35 MIL/uL (ref 3.87–5.11)
RDW: 12.8 % (ref 11.5–15.5)
WBC: 9.3 10*3/uL (ref 4.0–10.5)

## 2013-06-24 LAB — URINALYSIS, ROUTINE W REFLEX MICROSCOPIC
BILIRUBIN URINE: NEGATIVE
Glucose, UA: NEGATIVE mg/dL
Ketones, ur: NEGATIVE mg/dL
Leukocytes, UA: NEGATIVE
Nitrite: NEGATIVE
PROTEIN: NEGATIVE mg/dL
Specific Gravity, Urine: 1.006 (ref 1.005–1.030)
Urobilinogen, UA: 0.2 mg/dL (ref 0.0–1.0)
pH: 7 (ref 5.0–8.0)

## 2013-06-24 LAB — URINE MICROSCOPIC-ADD ON

## 2013-06-24 LAB — LIPASE, BLOOD: LIPASE: 36 U/L (ref 11–59)

## 2013-06-24 LAB — I-STAT CG4 LACTIC ACID, ED: Lactic Acid, Venous: 1.09 mmol/L (ref 0.5–2.2)

## 2013-06-24 MED ORDER — ONDANSETRON HCL 4 MG/2ML IJ SOLN
4.0000 mg | Freq: Once | INTRAMUSCULAR | Status: AC
  Administered 2013-06-24: 4 mg via INTRAVENOUS

## 2013-06-24 MED ORDER — ONDANSETRON HCL 4 MG/2ML IJ SOLN
4.0000 mg | Freq: Once | INTRAMUSCULAR | Status: AC
  Administered 2013-06-24: 4 mg via INTRAVENOUS
  Filled 2013-06-24: qty 2

## 2013-06-24 MED ORDER — ONDANSETRON HCL 4 MG/2ML IJ SOLN
INTRAMUSCULAR | Status: AC
  Filled 2013-06-24: qty 2

## 2013-06-24 MED ORDER — SODIUM CHLORIDE 0.9 % IV BOLUS (SEPSIS)
1000.0000 mL | Freq: Once | INTRAVENOUS | Status: AC
  Administered 2013-06-24: 1000 mL via INTRAVENOUS

## 2013-06-24 MED ORDER — POTASSIUM CHLORIDE CRYS ER 20 MEQ PO TBCR
40.0000 meq | EXTENDED_RELEASE_TABLET | Freq: Once | ORAL | Status: AC
Start: 2013-06-24 — End: 2013-06-24
  Administered 2013-06-24: 40 meq via ORAL
  Filled 2013-06-24: qty 2

## 2013-06-24 MED ORDER — SODIUM CHLORIDE 0.9 % IV SOLN: INTRAVENOUS | Status: DC

## 2013-06-24 MED ORDER — ONDANSETRON 8 MG PO TBDP: 8.0000 mg | ORAL_TABLET | Freq: Three times a day (TID) | ORAL | Status: DC | PRN

## 2013-06-24 NOTE — Discharge Instructions (Signed)
Abdominal Pain, Adult °Many things can cause abdominal pain. Usually, abdominal pain is not caused by a disease and will improve without treatment. It can often be observed and treated at home. Your health care provider will do a physical exam and possibly order blood tests and X-rays to help determine the seriousness of your pain. However, in many cases, more time must pass before a clear cause of the pain can be found. Before that point, your health care provider may not know if you need more testing or further treatment. °HOME CARE INSTRUCTIONS  °Monitor your abdominal pain for any changes. The following actions may help to alleviate any discomfort you are experiencing: °· Only take over-the-counter or prescription medicines as directed by your health care provider. °· Do not take laxatives unless directed to do so by your health care provider. °· Try a clear liquid diet (broth, tea, or water) as directed by your health care provider. Slowly move to a bland diet as tolerated. °SEEK MEDICAL CARE IF: °· You have unexplained abdominal pain. °· You have abdominal pain associated with nausea or diarrhea. °· You have pain when you urinate or have a bowel movement. °· You experience abdominal pain that wakes you in the night. °· You have abdominal pain that is worsened or improved by eating food. °· You have abdominal pain that is worsened with eating fatty foods. °SEEK IMMEDIATE MEDICAL CARE IF:  °· Your pain does not go away within 2 hours. °· You have a fever. °· You keep throwing up (vomiting). °· Your pain is felt only in portions of the abdomen, such as the right side or the left lower portion of the abdomen. °· You pass bloody or black tarry stools. °MAKE SURE YOU: °· Understand these instructions.   °· Will watch your condition.   °· Will get help right away if you are not doing well or get worse.   °Document Released: 12/24/2004 Document Revised: 01/04/2013 Document Reviewed: 11/23/2012 °[employer]® Patient  Information ©2014 [employer]. ° °

## 2013-06-24 NOTE — ED Notes (Signed)
Abdominal pain since Tuesday, some N/V, NO fever, NO diarrhea.

## 2013-06-24 NOTE — ED Provider Notes (Signed)
CSN: [id number]     Arrival date & time 06/24/13  1037 History   First MD Initiated Contact with Patient 06/24/13 1045     No chief complaint on file.    (Consider location/radiation/quality/duration/timing/severity/associated sxs/prior Treatment) The history is provided by the patient and a relative.   patient here with epigastric pain that has been persistent for several days but worse since yesterday. She notes emesis x3 which was nonbilious and nonbloody. Was seen in the Department 2 days ago and had an abdominal CT which was negative. Patient seen by her gastroenterologist and started on Carafate. She denies any fever or chills. No black or bloody stools. Denies any chest pain or shortness of breath. Denies any dyspnea. Nothing makes her symptoms better or worse. She has been compliant with her medications.  Past Medical History  Diagnosis Date  . Hyperlipidemia     takes Lipitor daily  . Incontinence of urine   . Depression   . Scoliosis   . Vocal cord dysfunction     "they don't operate properly" (10/28/2012)  . Diverticulosis   . Hemorrhoids   . Enteritis   . Anxiety     "more so the last few days" (10/28/2012)  . Exertional shortness of breath     "every now and then" (10/28/2012)  . Arthritis      Right hemiarthroplasty after hip fracture 06/2011  . DDD (degenerative disc disease)   . Arthritis     "hands" (10/28/2012)  . Chronic lower back pain     scoliosis  . Anxiety state, unspecified 10/28/2012  . Hypertension     takes Amlodipine,Losartan,Metoprolol,and HCTZ daily  . Insomnia     takes Restoril nightly  . Migraines     "back in my 20's; none since" (10/28/2012)  . Joint pain   . Osteopenia   . Osteoporosis   . Bruises easily     d/t being on prednisone and plavix  . History of scabies   . Diverticulitis     hx of  . Urinary frequency   . Cataract     immature on the left eye  . Complication of anesthesia     pt has a very high tolerance to meds  . CAD  (coronary artery disease)     a. Stent RCA in [facility];  b. Cath approx 2009 - nonobs per pt report.  . Sinus bradycardia     a. on chronic bb  . PAF (paroxysmal atrial fibrillation)     a. lone epidode in 2011 according to notes.  . Temporal arteritis 2011    a. followed @ [facility]; potential flareup 10/28/2012/notes 10/28/2012  . Giant cell arteritis    Past Surgical History  Procedure Laterality Date  . Bladder suspension  2001  . Hemiarthroplasty hip Right 2012  . Colonoscopy  01/26/2012    Procedure: COLONOSCOPY;  Surgeon: Ladene Artist, MD,FACG;  Location: [facility] ENDOSCOPY;  Service: Endoscopy;  Laterality: N/A;  note the EGD is possible  . Esophagogastroduodenoscopy  01/26/2012    Procedure: ESOPHAGOGASTRODUODENOSCOPY (EGD);  Surgeon: Ladene Artist, MD,FACG;  Location: [facility] ENDOSCOPY;  Service: Endoscopy;  Laterality: N/A;  . Cataract extraction w/ intraocular lens implant Right ~ 08/2012  . Temporal artery biopsy / ligation Bilateral 2011  . Tubal ligation  ~ 1982  . X-stop implantation  ~ 2010    "lower back" (10/28/2012)  . Abdominal hysterectomy  ~ 1984    vaginally  . Tonsillectomy and adenoidectomy  at age 7  . Laparoscopic cholecystectomy  2001  . Coronary angioplasty with stent placement  2006  . Back surgery  7-63yrs ago    X Stop  . Breast biopsy Right   . Moles removed that required stiches      one on leg and one on face  . Epidural block injection    . Lumbar fusion  03/2013   Family History  Problem Relation Age of Onset  . Cancer Daughter     breast  . Arthritis Neg Hx   . Heart disease Other   . Hypertension Other   . Heart attack Father   . Cancer Mother    History  Substance Use Topics  . Smoking status: Never Smoker   . Smokeless tobacco: Never Used  . Alcohol Use: 0.0 oz/week     Comment: socially   OB History   Grav Para Term Preterm Abortions TAB SAB Ect Mult Living                 Review of Systems  All other systems reviewed  and are negative.      Allergies  Tussionex pennkinetic er  Home Medications   Current Outpatient Rx  Name  Route  Sig  Dispense  Refill  . alendronate (FOSAMAX) 70 MG tablet   Oral   Take 70 mg by mouth once a week. Each Tuesday. Take with a full glass of water on an empty stomach.         Marland Kitchen amLODipine (NORVASC) 5 MG tablet   Oral   Take 5 mg by mouth daily.         Marland Kitchen atorvastatin (LIPITOR) 20 MG tablet   Oral   Take 20 mg by mouth daily.         . calcium carbonate (OS-CAL) 600 MG TABS   Oral   Take 600 mg by mouth daily.         . cholecalciferol (VITAMIN D) 1000 UNITS tablet   Oral   Take 1,000 Units by mouth 2 (two) times daily.          . clopidogrel (PLAVIX) 75 MG tablet   Oral   Take 75 mg by mouth daily with breakfast.         . escitalopram (LEXAPRO) 10 MG tablet   Oral   Take 1 tablet (10 mg total) by mouth daily.   30 tablet   5   . hydrochlorothiazide (MICROZIDE) 12.5 MG capsule   Oral   Take 1 capsule (12.5 mg total) by mouth daily.   30 capsule   11   . losartan (COZAAR) 100 MG tablet   Oral   Take 1 tablet (100 mg total) by mouth daily.   30 tablet   11   . metoprolol tartrate (LOPRESSOR) 25 MG tablet   Oral   Take 25 mg by mouth 2 (two) times daily.         Marland Kitchen OVER THE COUNTER MEDICATION   Oral   Take 2 tablets by mouth daily. Juice Plus Vegetable         . oxyCODONE-acetaminophen (PERCOCET/ROXICET) 5-325 MG per tablet   Oral   Take 1 tablet by mouth every 6 (six) hours as needed for severe pain.         . pantoprazole (PROTONIX) 40 MG tablet   Oral   Take 40 mg by mouth daily.         . sucralfate (CARAFATE) 1 G  tablet   Oral   Take 1 tablet (1 g total) by mouth 4 (four) times daily -  with meals and at bedtime.   20 tablet   0   . temazepam (RESTORIL) 15 MG capsule   Oral   Take 15 mg by mouth at bedtime.         . vitamin C (ASCORBIC ACID) 500 MG tablet   Oral   Take 1,000 mg by mouth daily as  needed (for sore throat).           BP 137/65  Pulse 82  Temp(Src) 98.4 F (36.9 C) (Oral)  Resp 18  SpO2 95% Physical Exam  Nursing note and vitals reviewed. Constitutional: She is oriented to person, place, and time. She appears well-developed and well-nourished.  Non-toxic appearance. No distress.  HENT:  Head: Normocephalic and atraumatic.  Eyes: Conjunctivae, EOM and lids are normal. Pupils are equal, round, and reactive to light.  Neck: Normal range of motion. Neck supple. No tracheal deviation present. No mass present.  Cardiovascular: Normal rate, regular rhythm and normal heart sounds.  Exam reveals no gallop.   No murmur heard. Pulmonary/Chest: Effort normal and breath sounds normal. No stridor. No respiratory distress. She has no decreased breath sounds. She has no wheezes. She has no rhonchi. She has no rales.  Abdominal: Soft. Normal appearance and bowel sounds are normal. She exhibits no distension. There is no tenderness. There is no rigidity, no rebound, no guarding and no CVA tenderness.  Musculoskeletal: Normal range of motion. She exhibits no edema and no tenderness.  Neurological: She is alert and oriented to person, place, and time. She has normal strength. No cranial nerve deficit or sensory deficit. GCS eye subscore is 4. GCS verbal subscore is 5. GCS motor subscore is 6.  Skin: Skin is warm and dry. No abrasion and no rash noted.  Psychiatric: She has a normal mood and affect. Her speech is normal and behavior is normal.    ED Course  Procedures (including critical care time) Labs Review Labs Reviewed  URINALYSIS, ROUTINE W REFLEX MICROSCOPIC - Abnormal; Notable for the following:    Hgb urine dipstick SMALL (*)    All other components within normal limits  COMPREHENSIVE METABOLIC PANEL - Abnormal; Notable for the following:    Sodium 133 (*)    Potassium 3.1 (*)    Chloride 93 (*)    Glucose, Bld 101 (*)    GFR calc non Af Amer 86 (*)    All other  components within normal limits  CBC WITH DIFFERENTIAL  LIPASE, BLOOD  URINE MICROSCOPIC-ADD ON  I-STAT CG4 LACTIC ACID, ED   Imaging Review No results found.   EKG Interpretation None      MDM   Final diagnoses:  None     Date: 06/24/2013  Rate: 75  Rhythm: normal sinus rhythm  QRS Axis: normal  Intervals: normal  ST/T Wave abnormalities: normal  Conduction Disutrbances:none  Narrative Interpretation:   Old EKG Reviewed: none available  12:45 PM Patient given IV fluids here feels better. Potassium given for her mild hypokalemia. Repeat abdominal exam at time of discharge remains benign. She will followup with her doctor.    Leota Jacobsen, MD 06/24/13 1246

## 2013-06-28 DIAGNOSIS — I10 Essential (primary) hypertension: Secondary | ICD-10-CM | POA: Diagnosis not present

## 2013-06-28 DIAGNOSIS — Z6826 Body mass index (BMI) 26.0-26.9, adult: Secondary | ICD-10-CM | POA: Diagnosis not present

## 2013-06-28 DIAGNOSIS — M4120 Other idiopathic scoliosis, site unspecified: Secondary | ICD-10-CM | POA: Diagnosis not present

## 2013-07-05 ENCOUNTER — Telehealth: Payer: Self-pay | Admitting: Family Medicine

## 2013-07-05 NOTE — Telephone Encounter (Signed)
Pt would like blood work results °

## 2013-07-06 NOTE — Telephone Encounter (Signed)
Pt saw lab results on mychart

## 2013-07-06 NOTE — Telephone Encounter (Signed)
Which lab is she referring to?  ER?  Send her any labs she has question about.

## 2013-07-07 DIAGNOSIS — Z79899 Other long term (current) drug therapy: Secondary | ICD-10-CM | POA: Diagnosis not present

## 2013-07-07 DIAGNOSIS — M316 Other giant cell arteritis: Secondary | ICD-10-CM | POA: Diagnosis not present

## 2013-07-11 ENCOUNTER — Other Ambulatory Visit (INDEPENDENT_AMBULATORY_CARE_PROVIDER_SITE_OTHER): Payer: Medicare Other

## 2013-07-11 DIAGNOSIS — E559 Vitamin D deficiency, unspecified: Secondary | ICD-10-CM

## 2013-07-11 DIAGNOSIS — Z79899 Other long term (current) drug therapy: Secondary | ICD-10-CM

## 2013-07-11 DIAGNOSIS — M316 Other giant cell arteritis: Secondary | ICD-10-CM

## 2013-07-11 LAB — SEDIMENTATION RATE: SED RATE: 26 mm/h — AB (ref 0–22)

## 2013-07-11 LAB — HIGH SENSITIVITY CRP: CRP HIGH SENSITIVITY: 16.01 mg/L — AB (ref 0.000–5.000)

## 2013-07-12 DIAGNOSIS — M316 Other giant cell arteritis: Secondary | ICD-10-CM | POA: Diagnosis not present

## 2013-07-12 DIAGNOSIS — Z79899 Other long term (current) drug therapy: Secondary | ICD-10-CM | POA: Diagnosis not present

## 2013-07-12 DIAGNOSIS — E559 Vitamin D deficiency, unspecified: Secondary | ICD-10-CM | POA: Diagnosis not present

## 2013-07-13 LAB — VITAMIN D 25 HYDROXY (VIT D DEFICIENCY, FRACTURES): Vit D, 25-Hydroxy: 54 ng/mL (ref 30–89)

## 2013-07-14 ENCOUNTER — Ambulatory Visit (INDEPENDENT_AMBULATORY_CARE_PROVIDER_SITE_OTHER): Payer: Medicare Other | Admitting: Family Medicine

## 2013-07-14 ENCOUNTER — Encounter: Payer: Self-pay | Admitting: Family Medicine

## 2013-07-14 VITALS — BP 136/78 | HR 82 | Wt 157.0 lb

## 2013-07-14 DIAGNOSIS — F331 Major depressive disorder, recurrent, moderate: Secondary | ICD-10-CM

## 2013-07-14 NOTE — Patient Instructions (Signed)

## 2013-07-14 NOTE — Progress Notes (Signed)
Pre visit review using our clinic review tool, if applicable. No additional management support is needed unless otherwise documented below in the visit note. 

## 2013-07-14 NOTE — Progress Notes (Signed)
Subjective:    Patient ID: Betty Jackson, female    DOB: July 01, 1934, 79 y.o.   MRN: [id number]  HPI Patient seen for followup depression. She was seen recently and started on Lexapro 10 mg once daily. She has seen great improvement. Increased energy, decreased motivation, fewer crying spells. She's had some chronic sleep difficulties which are essentially unchanged. She started engaging in activities more with volunteer work. Overall she is very pleased.  She has history of temporal arteritis and recently her sed rate and C-reactive protein levels were back up. Her prednisone has been increased back up to 15 mg daily. She has had bone density scan within the past year. She has had intolerance with Fosamax secondary to GI symptoms and her rheumatologist is looking at possible injection therapy.  Past Medical History  Diagnosis Date  . Hyperlipidemia     takes Lipitor daily  . Incontinence of urine   . Depression   . Scoliosis   . Vocal cord dysfunction     "they don't operate properly" (10/28/2012)  . Diverticulosis   . Hemorrhoids   . Enteritis   . Anxiety     "more so the last few days" (10/28/2012)  . Exertional shortness of breath     "every now and then" (10/28/2012)  . Arthritis      Right hemiarthroplasty after hip fracture 06/2011  . DDD (degenerative disc disease)   . Arthritis     "hands" (10/28/2012)  . Chronic lower back pain     scoliosis  . Anxiety state, unspecified 10/28/2012  . Hypertension     takes Amlodipine,Losartan,Metoprolol,and HCTZ daily  . Insomnia     takes Restoril nightly  . Migraines     "back in my 20's; none since" (10/28/2012)  . Joint pain   . Osteopenia   . Osteoporosis   . Bruises easily     d/t being on prednisone and plavix  . History of scabies   . Diverticulitis     hx of  . Urinary frequency   . Cataract     immature on the left eye  . Complication of anesthesia     pt has a very high tolerance to meds  . CAD (coronary artery disease)       a. Stent RCA in [facility];  b. Cath approx 2009 - nonobs per pt report.  . Sinus bradycardia     a. on chronic bb  . PAF (paroxysmal atrial fibrillation)     a. lone epidode in 2011 according to notes.  . Temporal arteritis 2011    a. followed @ [facility]; potential flareup 10/28/2012/notes 10/28/2012  . Giant cell arteritis    Past Surgical History  Procedure Laterality Date  . Bladder suspension  2001  . Hemiarthroplasty hip Right 2012  . Colonoscopy  01/26/2012    Procedure: COLONOSCOPY;  Surgeon: Ladene Artist, MD,FACG;  Location: [facility] ENDOSCOPY;  Service: Endoscopy;  Laterality: N/A;  note the EGD is possible  . Esophagogastroduodenoscopy  01/26/2012    Procedure: ESOPHAGOGASTRODUODENOSCOPY (EGD);  Surgeon: Ladene Artist, MD,FACG;  Location: [facility] ENDOSCOPY;  Service: Endoscopy;  Laterality: N/A;  . Cataract extraction w/ intraocular lens implant Right ~ 08/2012  . Temporal artery biopsy / ligation Bilateral 2011  . Tubal ligation  ~ 1982  . X-stop implantation  ~ 2010    "lower back" (10/28/2012)  . Abdominal hysterectomy  ~ 1984    vaginally  . Tonsillectomy and adenoidectomy  at age 36  . Laparoscopic cholecystectomy  2001  . Coronary angioplasty with stent placement  2006  . Back surgery  7-35yrs ago    X Stop  . Breast biopsy Right   . Moles removed that required stiches      one on leg and one on face  . Epidural block injection    . Lumbar fusion  03/2013    reports that she has never smoked. She has never used smokeless tobacco. She reports that she drinks alcohol. She reports that she does not use illicit drugs. family history includes Cancer in her daughter and mother; Heart attack in her father; Heart disease in her other; Hypertension in her other. There is no history of Arthritis. Allergies  Allergen Reactions  . Dextromethorphan Rash      Review of Systems  Constitutional: Negative for appetite change and unexpected weight change.  Respiratory:  Negative for cough and shortness of breath.   Cardiovascular: Negative for chest pain.  Gastrointestinal: Negative for abdominal pain.  Endocrine: Negative for polydipsia and polyuria.  Neurological: Negative for dizziness.       Objective:   Physical Exam  Constitutional: She is oriented to person, place, and time. She appears well-developed and well-nourished.  Neck: Neck supple. No thyromegaly present.  Cardiovascular: Normal rate and regular rhythm.   Pulmonary/Chest: Effort normal and breath sounds normal. No respiratory distress. She has no wheezes. She has no rales.  Neurological: She is alert and oriented to person, place, and time.          Assessment & Plan:  Maj. depressive episode. Significantly improved. Continue Lexapro 10 mg daily for a minimum of 4-9 months. Reassess in 4 months time.

## 2013-07-16 DIAGNOSIS — F331 Major depressive disorder, recurrent, moderate: Secondary | ICD-10-CM

## 2013-07-16 HISTORY — DX: Major depressive disorder, recurrent, moderate: F33.1

## 2013-07-17 ENCOUNTER — Other Ambulatory Visit (INDEPENDENT_AMBULATORY_CARE_PROVIDER_SITE_OTHER): Payer: Medicare Other

## 2013-07-17 ENCOUNTER — Telehealth: Payer: Self-pay | Admitting: Cardiovascular Disease

## 2013-07-17 ENCOUNTER — Ambulatory Visit (INDEPENDENT_AMBULATORY_CARE_PROVIDER_SITE_OTHER): Payer: Medicare Other | Admitting: Cardiovascular Disease

## 2013-07-17 ENCOUNTER — Encounter: Payer: Self-pay | Admitting: Cardiovascular Disease

## 2013-07-17 VITALS — BP 118/72 | HR 80 | Ht 64.5 in | Wt 152.0 lb

## 2013-07-17 DIAGNOSIS — Z79899 Other long term (current) drug therapy: Secondary | ICD-10-CM

## 2013-07-17 DIAGNOSIS — I2510 Atherosclerotic heart disease of native coronary artery without angina pectoris: Secondary | ICD-10-CM

## 2013-07-17 DIAGNOSIS — I10 Essential (primary) hypertension: Secondary | ICD-10-CM

## 2013-07-17 DIAGNOSIS — M316 Other giant cell arteritis: Secondary | ICD-10-CM | POA: Diagnosis not present

## 2013-07-17 LAB — HIGH SENSITIVITY CRP: CRP HIGH SENSITIVITY: 5.76 mg/L — AB (ref 0.000–5.000)

## 2013-07-17 LAB — SEDIMENTATION RATE: Sed Rate: 22 mm/hr (ref 0–22)

## 2013-07-17 NOTE — Telephone Encounter (Signed)
Pt's grandaughter will be her today at 4pm   Pt grand mom would like a call back to conf. This.

## 2013-07-17 NOTE — Progress Notes (Signed)
History of Present Illness: 79 yo female with history of CAD s/p stent RCA, HTN, HLD, GERD, temporal arteritis here today for cardiac follow up. She had a stent placed in the RCA in [place], [place] in 2005. She moved to [facility] for treatment of her temporal arteritis at [facility]. ABIs 12/12: Normal. Patient was admitted 8/1-10/30/12 with a suspected acute CVA (punctate infarct and right caudate nucleus). She was placed on Plavix per neurology recommendations. Carotid US 8/14: Bilateral less than 39% ICA stenosis. Echocardiogram 10/30/12: EF 65-70%, normal wall motion, PASP 34. Her ESR was elevated and visual changes involved the right eye. It was felt the patient had a flare of her temporal arteritis and a punctate stroke seen on MRI may be a manifestation of the temporal arteritis. The infarct was felt to be asymptomatic. She was also placed on steroids. She had some dyspnea and chest pain so cardiac cath was arranged. Cardiac cath 12/09/12 with patent RCA stent, mild disease LAD and Circumfelx, normal filling pressures. Echo August 2014 with normal LV size and function, no significant valve issues. She had back surgery January 2015 at [facility].   She is here today for follow up. She tells me that she feels well. She has had no chest pain or SOB.   Primary Care Physician: Carolann Littler, MD  Fasting Lipid Profile:  Lipid Panel     Component Value Date/Time   CHOL 120 10/30/2012 0510   TRIG 86 10/30/2012 0510   HDL 58 10/30/2012 0510   CHOLHDL 2.1 10/30/2012 0510   VLDL 17 10/30/2012 0510   LDLCALC 45 10/30/2012 0510     Past Medical History  Diagnosis Date  . Hyperlipidemia     takes Lipitor daily  . Incontinence of urine   . Depression   . Scoliosis   . Vocal cord dysfunction     "they don't operate properly" (10/28/2012)  . Diverticulosis   . Hemorrhoids   . Enteritis   . Anxiety     "more so the last few days" (10/28/2012)  . Exertional shortness of breath     "every now and then" (10/28/2012)  . Arthritis        Right hemiarthroplasty after hip fracture 06/2011  . DDD (degenerative disc disease)   . Arthritis     "hands" (10/28/2012)  . Chronic lower back pain     scoliosis  . Anxiety state, unspecified 10/28/2012  . Hypertension     takes Amlodipine,Losartan,Metoprolol,and HCTZ daily  . Insomnia     takes Restoril nightly  . Migraines     "back in my 20's; none since" (10/28/2012)  . Joint pain   . Osteopenia   . Osteoporosis   . Bruises easily     d/t being on prednisone and plavix  . History of scabies   . Diverticulitis     hx of  . Urinary frequency   . Cataract     immature on the left eye  . Complication of anesthesia     pt has a very high tolerance to meds  . CAD (coronary artery disease)     a. Stent RCA in [facility];  b. Cath approx 2009 - nonobs per pt report.  . Sinus bradycardia     a. on chronic bb  . PAF (paroxysmal atrial fibrillation)     a. lone epidode in 2011 according to notes.  . Temporal arteritis 2011    a. followed @ [facility]; potential flareup 10/28/2012/notes 10/28/2012  .  Giant cell arteritis     Past Surgical History  Procedure Laterality Date  . Bladder suspension  2001  . Hemiarthroplasty hip Right 2012  . Colonoscopy  01/26/2012    Procedure: COLONOSCOPY;  Surgeon: Ladene Artist, MD,FACG;  Location: [facility] ENDOSCOPY;  Service: Endoscopy;  Laterality: N/A;  note the EGD is possible  . Esophagogastroduodenoscopy  01/26/2012    Procedure: ESOPHAGOGASTRODUODENOSCOPY (EGD);  Surgeon: Ladene Artist, MD,FACG;  Location: [facility] ENDOSCOPY;  Service: Endoscopy;  Laterality: N/A;  . Cataract extraction w/ intraocular lens implant Right ~ 08/2012  . Temporal artery biopsy / ligation Bilateral 2011  . Tubal ligation  ~ 1982  . X-stop implantation  ~ 2010    "lower back" (10/28/2012)  . Abdominal hysterectomy  ~ 1984    vaginally  . Tonsillectomy and adenoidectomy      at age 54  . Laparoscopic cholecystectomy  2001  . Coronary angioplasty with stent placement   2006  . Back surgery  7-75yrs ago    X Stop  . Breast biopsy Right   . Moles removed that required stiches      one on leg and one on face  . Epidural block injection    . Lumbar fusion  03/2013    Current Outpatient Prescriptions  Medication Sig Dispense Refill  . alendronate (FOSAMAX) 70 MG tablet Take 70 mg by mouth once a week. Each Tuesday. Take with a full glass of water on an empty stomach.      Marland Kitchen amLODipine (NORVASC) 5 MG tablet Take 5 mg by mouth daily.      Marland Kitchen atorvastatin (LIPITOR) 20 MG tablet Take 20 mg by mouth daily.      . calcium carbonate (OS-CAL) 600 MG TABS Take 600 mg by mouth daily.      . cholecalciferol (VITAMIN D) 1000 UNITS tablet Take 1,000 Units by mouth 2 (two) times daily.       . clopidogrel (PLAVIX) 75 MG tablet Take 75 mg by mouth daily with breakfast.      . DEXILANT 60 MG capsule Take 60 mg by mouth daily.       Marland Kitchen escitalopram (LEXAPRO) 10 MG tablet Take 1 tablet (10 mg total) by mouth daily.  30 tablet  5  . hydrochlorothiazide (MICROZIDE) 12.5 MG capsule Take 1 capsule (12.5 mg total) by mouth daily.  30 capsule  11  . losartan (COZAAR) 100 MG tablet Take 1 tablet (100 mg total) by mouth daily.  30 tablet  11  . metoprolol tartrate (LOPRESSOR) 25 MG tablet Take 25 mg by mouth 2 (two) times daily.      . ondansetron (ZOFRAN ODT) 8 MG disintegrating tablet Take 1 tablet (8 mg total) by mouth every 8 (eight) hours as needed for nausea or vomiting.  20 tablet  0  . OVER THE COUNTER MEDICATION Take 2 tablets by mouth daily. Juice Plus Vegetable      . oxyCODONE-acetaminophen (PERCOCET/ROXICET) 5-325 MG per tablet Take 1 tablet by mouth every 6 (six) hours as needed for severe pain.      . predniSONE (DELTASONE) 20 MG tablet Take 10 mg by mouth daily with breakfast. With $RemoveBeforeD'5mg'[id number]$  tablet      . predniSONE (DELTASONE) 5 MG tablet 5 mg. With half of $Remov'20mg'MZuCvi$  tablet      . temazepam (RESTORIL) 15 MG capsule Take 15 mg by mouth at bedtime.      . vitamin C (ASCORBIC ACID)  500 MG tablet Take 1,000 mg  by mouth daily as needed (for sore throat).        No current facility-administered medications for this visit.    Allergies  Allergen Reactions  . Dextromethorphan Rash    History   Social History  . Marital Status: Married    Spouse Name: N/A    Number of Children: 3  . Years of Education: N/A   Occupational History  . Retired Cabin crew    Social History Main Topics  . Smoking status: Never Smoker   . Smokeless tobacco: Never Used  . Alcohol Use: 0.0 oz/week     Comment: socially  . Drug Use: No  . Sexual Activity: Not on file   Other Topics Concern  . Not on file   Social History Narrative   Lives in [place] with her husband.  Retired Cabin crew.    Family History  Problem Relation Age of Onset  . Cancer Daughter     breast  . Arthritis Neg Hx   . Heart disease Other   . Hypertension Other   . Heart attack Father   . Cancer Mother     Review of Systems:  As stated in the HPI and otherwise negative.   BP 118/72  Pulse 80  Ht 5' 4.5" (1.638 m)  Wt 152 lb (68.947 kg)  BMI 25.70 kg/m2  Physical Examination: General: Well developed, well nourished, NAD HEENT: OP clear, mucus membranes moist SKIN: warm, dry. No rashes. Neuro: No focal deficits Musculoskeletal: Muscle strength 5/5 all ext Psychiatric: Mood and affect normal Neck: No JVD, no carotid bruits, no thyromegaly, no lymphadenopathy. Lungs:Clear bilaterally, no wheezes, rhonci, crackles Cardiovascular: Regular rate and rhythm. No murmurs, gallops or rubs. Abdomen:Soft. Bowel sounds present. Non-tender.  Extremities: No lower extremity edema. Pulses are 2 + in the left  DP/PT and the right PT. Could not find right DP with doppler.   Cardiac cath 12/09/12: Ao: 150/61  LV: 155/13/18  RA: 6  RV: 25/5/8  PA: 24/10 (mean 16)  PCWP: 9  Fick Cardiac Output: 3.9 L/min  Fick Cardiac Index: 2.3 L/min/m2  Central Aortic Saturation: 98%  Pulmonary Artery Saturation: 69%    Angiographic Findings:  Left main: 10% distal stenosis.  Left Anterior Descending Artery: Large caliber vessel that courses to the apex. The proximal and mid vessel has moderate calcification. The mid vessel has a long 20% stenosis. There are two small caliber diagonal branches with mild plaque disease.  Circumflex Artery: Large caliber vessel with two small caliber obtuse marginal branches. No obstructive disease.  Right Coronary Artery: Large caliber, dominant vessel with a patent stent in the mid vessel. There is minimal stent restenosis. Just beyond the stent there is a 20% stenosis.  Left Ventricular Angiogram: LVEF=65%  Impression:  1. Stable single vessel CAD with patent mid RCA stent  2. Normal LV systolic function  3. Normal PA pressures  Echo August 2014: Left ventricle: The cavity size was normal. Wall thickness was normal. Systolic function was vigorous. The estimated ejection fraction was in the range of 65% to 70%. Wall motion was normal; there were no regional wall motion abnormalities. - Pulmonary arteries: PA peak pressure: 93m Hg (S).  Assessment and Plan:   1. CAD: Stable by cath September 2014. Continue Plavix, statin, beta blocker. Follow up one year.   2. HTN: BP controlled. No changes.

## 2013-07-17 NOTE — Telephone Encounter (Signed)
appt scheduled

## 2013-07-17 NOTE — Patient Instructions (Signed)
Your physician wants you to follow-up in:  12 months.  You will receive a reminder letter in the mail two months in advance. If you don't receive a letter, please call our office to schedule the follow-up appointment.   

## 2013-07-17 NOTE — Telephone Encounter (Signed)
Following up      on an appointment for her daughter  Please give her a call back on the cell

## 2013-07-26 ENCOUNTER — Other Ambulatory Visit: Payer: Self-pay | Admitting: Family Medicine

## 2013-07-26 ENCOUNTER — Other Ambulatory Visit (INDEPENDENT_AMBULATORY_CARE_PROVIDER_SITE_OTHER): Payer: Medicare Other

## 2013-07-26 DIAGNOSIS — M316 Other giant cell arteritis: Secondary | ICD-10-CM | POA: Diagnosis not present

## 2013-07-26 DIAGNOSIS — Z79899 Other long term (current) drug therapy: Secondary | ICD-10-CM | POA: Diagnosis not present

## 2013-07-26 DIAGNOSIS — I2510 Atherosclerotic heart disease of native coronary artery without angina pectoris: Secondary | ICD-10-CM

## 2013-07-26 LAB — SEDIMENTATION RATE: Sed Rate: 12 mm/hr (ref 0–22)

## 2013-07-26 LAB — HIGH SENSITIVITY CRP: CRP, High Sensitivity: 0.48 mg/L (ref 0.000–5.000)

## 2013-07-27 NOTE — Telephone Encounter (Signed)
Last visit 07/14/13 Last refill 04/01/13 #60 1 refill

## 2013-07-27 NOTE — Telephone Encounter (Signed)
Refill for 3 months. 

## 2013-08-14 ENCOUNTER — Other Ambulatory Visit (INDEPENDENT_AMBULATORY_CARE_PROVIDER_SITE_OTHER): Payer: Medicare Other

## 2013-08-14 DIAGNOSIS — M316 Other giant cell arteritis: Secondary | ICD-10-CM | POA: Diagnosis not present

## 2013-08-14 DIAGNOSIS — I2510 Atherosclerotic heart disease of native coronary artery without angina pectoris: Secondary | ICD-10-CM

## 2013-08-14 DIAGNOSIS — Z79899 Other long term (current) drug therapy: Secondary | ICD-10-CM | POA: Diagnosis not present

## 2013-08-14 LAB — HIGH SENSITIVITY CRP: CRP HIGH SENSITIVITY: 0.47 mg/L (ref 0.000–5.000)

## 2013-08-14 LAB — SEDIMENTATION RATE: SED RATE: 14 mm/h (ref 0–22)

## 2013-08-22 ENCOUNTER — Ambulatory Visit (INDEPENDENT_AMBULATORY_CARE_PROVIDER_SITE_OTHER): Payer: Medicare Other | Admitting: Gastroenterology

## 2013-08-22 ENCOUNTER — Encounter: Payer: Self-pay | Admitting: Gastroenterology

## 2013-08-22 VITALS — BP 130/60 | HR 70 | Ht 64.5 in | Wt 156.2 lb

## 2013-08-22 DIAGNOSIS — I2510 Atherosclerotic heart disease of native coronary artery without angina pectoris: Secondary | ICD-10-CM | POA: Diagnosis not present

## 2013-08-22 DIAGNOSIS — R1013 Epigastric pain: Secondary | ICD-10-CM | POA: Diagnosis not present

## 2013-08-22 MED ORDER — OMEPRAZOLE 20 MG PO CPDR: 20.0000 mg | DELAYED_RELEASE_CAPSULE | Freq: Every day | ORAL | Status: DC

## 2013-08-22 NOTE — Progress Notes (Signed)
    History of Present Illness: This is a 79 year old female who returns for followup of epigastric pain. She had complete resolution of her epigastric within 2 weeks of seeing Alonza Bogus, PAC in the office. She states Dexilant gave her diarrhea so she discontinued it. She has substantially modify her diet avoiding spicy and high acid content foods. She has no GI complaints.  Current Medications, Allergies, Past Medical History, Past Surgical History, Family History and Social History were reviewed in [employer] record.  Physical Exam: General: Well developed , well nourished, no acute distress Head: Normocephalic and atraumatic Eyes:  sclerae anicteric, EOMI Ears: Normal auditory acuity Mouth: No deformity or lesions Lungs: Clear throughout to auscultation Heart: Regular rate and rhythm; no murmurs, rubs or bruits Abdomen: Soft, non tender and non distended. No masses, hepatosplenomegaly or hernias noted. Normal Bowel sounds Musculoskeletal: Symmetrical with no gross deformities  Pulses:  Normal pulses noted Extremities: No clubbing, cyanosis, edema or deformities noted Neurological: Alert oriented x 4, grossly nonfocal Psychological:  Alert and cooperative. Normal mood and affect  Assessment and Recommendations:  1. Presumed GERD. Standard antireflux measures. Avoid foods that trigger symptoms. Begin omeprazole 20 mg daily long-term. Followup when necessary.

## 2013-08-22 NOTE — Patient Instructions (Signed)
We have sent the following medications to your pharmacy for you to pick up at your convenience:Omeprazole.   Thank you for choosing me and [facility].  Malcolm T. Stark, Jr., MD., FACG   

## 2013-08-28 ENCOUNTER — Other Ambulatory Visit (INDEPENDENT_AMBULATORY_CARE_PROVIDER_SITE_OTHER): Payer: Medicare Other

## 2013-08-28 ENCOUNTER — Other Ambulatory Visit: Payer: Self-pay | Admitting: Family Medicine

## 2013-08-28 DIAGNOSIS — M316 Other giant cell arteritis: Secondary | ICD-10-CM | POA: Diagnosis not present

## 2013-08-28 DIAGNOSIS — I2510 Atherosclerotic heart disease of native coronary artery without angina pectoris: Secondary | ICD-10-CM

## 2013-08-28 DIAGNOSIS — Z79899 Other long term (current) drug therapy: Secondary | ICD-10-CM | POA: Diagnosis not present

## 2013-08-28 LAB — SEDIMENTATION RATE: Sed Rate: 12 mm/hr (ref 0–22)

## 2013-08-28 LAB — HIGH SENSITIVITY CRP: CRP, High Sensitivity: 0.83 mg/L (ref 0.000–5.000)

## 2013-08-31 ENCOUNTER — Telehealth: Payer: Self-pay | Admitting: Family Medicine

## 2013-08-31 MED ORDER — ATORVASTATIN CALCIUM 20 MG PO TABS: 20.0000 mg | ORAL_TABLET | Freq: Every day | ORAL | Status: DC

## 2013-08-31 NOTE — Telephone Encounter (Signed)
Rx sent to pharmacy   

## 2013-08-31 NOTE — Telephone Encounter (Signed)
[id number] - [place],  - [address] is requesting 90 day re-fill on atorvastatin (LIPITOR) 20 MG tablet

## 2013-09-06 DIAGNOSIS — M545 Low back pain, unspecified: Secondary | ICD-10-CM | POA: Diagnosis not present

## 2013-09-06 DIAGNOSIS — M4120 Other idiopathic scoliosis, site unspecified: Secondary | ICD-10-CM | POA: Diagnosis not present

## 2013-09-12 ENCOUNTER — Other Ambulatory Visit (INDEPENDENT_AMBULATORY_CARE_PROVIDER_SITE_OTHER): Payer: Medicare Other

## 2013-09-12 DIAGNOSIS — Z79899 Other long term (current) drug therapy: Secondary | ICD-10-CM | POA: Diagnosis not present

## 2013-09-12 DIAGNOSIS — M316 Other giant cell arteritis: Secondary | ICD-10-CM

## 2013-09-12 LAB — HIGH SENSITIVITY CRP: CRP HIGH SENSITIVITY: 2.32 mg/L (ref 0.000–5.000)

## 2013-09-12 LAB — SEDIMENTATION RATE: SED RATE: 13 mm/h (ref 0–22)

## 2013-09-15 DIAGNOSIS — R490 Dysphonia: Secondary | ICD-10-CM | POA: Diagnosis not present

## 2013-09-25 ENCOUNTER — Encounter: Payer: Self-pay | Admitting: Family Medicine

## 2013-09-25 ENCOUNTER — Ambulatory Visit (INDEPENDENT_AMBULATORY_CARE_PROVIDER_SITE_OTHER): Payer: Medicare Other | Admitting: Family Medicine

## 2013-09-25 VITALS — BP 132/72 | HR 76 | Wt 159.0 lb

## 2013-09-25 DIAGNOSIS — S40029A Contusion of unspecified upper arm, initial encounter: Secondary | ICD-10-CM | POA: Diagnosis not present

## 2013-09-25 DIAGNOSIS — I2510 Atherosclerotic heart disease of native coronary artery without angina pectoris: Secondary | ICD-10-CM

## 2013-09-25 DIAGNOSIS — S40022A Contusion of left upper arm, initial encounter: Secondary | ICD-10-CM

## 2013-09-25 NOTE — Progress Notes (Signed)
Pre visit review using our clinic review tool, if applicable. No additional management support is needed unless otherwise documented below in the visit note. 

## 2013-09-25 NOTE — Patient Instructions (Signed)
Contusion °A contusion is a deep bruise. Contusions are the result of an injury that caused bleeding under the skin. The contusion may turn blue, purple, or yellow. Minor injuries will give you a painless contusion, but more severe contusions may stay painful and swollen for a few weeks.  °CAUSES  °A contusion is usually caused by a blow, trauma, or direct force to an area of the body. °SYMPTOMS  °· Swelling and redness of the injured area. °· Bruising of the injured area. °· Tenderness and soreness of the injured area. °· Pain. °DIAGNOSIS  °The diagnosis can be made by taking a history and physical exam. An X-ray, CT scan, or MRI may be needed to determine if there were any associated injuries, such as fractures. °TREATMENT  °Specific treatment will depend on what area of the body was injured. In general, the best treatment for a contusion is resting, icing, elevating, and applying cold compresses to the injured area. Over-the-counter medicines may also be recommended for pain control. Ask your caregiver what the best treatment is for your contusion. °HOME CARE INSTRUCTIONS  °· Put ice on the injured area. °¨ Put ice in a plastic bag. °¨ Place a towel between your skin and the bag. °¨ Leave the ice on for 15-20 minutes, 3-4 times a day, or as directed by your health care Araya Roel. °· Only take over-the-counter or prescription medicines for pain, discomfort, or fever as directed by your caregiver. Your caregiver may recommend avoiding anti-inflammatory medicines (aspirin, ibuprofen, and naproxen) for 48 hours because these medicines may increase bruising. °· Rest the injured area. °· If possible, elevate the injured area to reduce swelling. °SEEK IMMEDIATE MEDICAL CARE IF:  °· You have increased bruising or swelling. °· You have pain that is getting worse. °· Your swelling or pain is not relieved with medicines. °MAKE SURE YOU:  °· Understand these instructions. °· Will watch your condition. °· Will get help right  away if you are not doing well or get worse. °Document Released: 12/24/2004 Document Revised: 03/21/2013 Document Reviewed: 01/19/2011 °ExitCare® Patient Information ©2015 [employer]. This information is not intended to replace advice given to you by your health care Lucile Hillmann. Make sure you discuss any questions you have with your health care Azaliah Carrero. ° °

## 2013-09-25 NOTE — Progress Notes (Signed)
Subjective:    Patient ID: Betty Jackson, female    DOB: July 16, 1934, 79 y.o.   MRN: [id number]  HPI  Patient seen with ecchymosis left biceps region. She does not recall injury. This was noted last Friday. She takes Plavix and has an increased risk of bruising. Denies any other bruises or other bleeding issues. No pain with arm use at this time.  Past Medical History  Diagnosis Date  . Hyperlipidemia     takes Lipitor daily  . Incontinence of urine   . Depression   . Scoliosis   . Vocal cord dysfunction     "they don't operate properly" (10/28/2012)  . Diverticulosis   . Hemorrhoids   . Enteritis   . Anxiety     "more so the last few days" (10/28/2012)  . Exertional shortness of breath     "every now and then" (10/28/2012)  . Arthritis      Right hemiarthroplasty after hip fracture 06/2011  . DDD (degenerative disc disease)   . Arthritis     "hands" (10/28/2012)  . Chronic lower back pain     scoliosis  . Anxiety state, unspecified 10/28/2012  . Hypertension     takes Amlodipine,Losartan,Metoprolol,and HCTZ daily  . Insomnia     takes Restoril nightly  . Migraines     "back in my 20's; none since" (10/28/2012)  . Joint pain   . Osteopenia   . Osteoporosis   . Bruises easily     d/t being on prednisone and plavix  . History of scabies   . Diverticulitis     hx of  . Urinary frequency   . Cataract     immature on the left eye  . Complication of anesthesia     pt has a very high tolerance to meds  . CAD (coronary artery disease)     a. Stent RCA in [facility];  b. Cath approx 2009 - nonobs per pt report.  . Sinus bradycardia     a. on chronic bb  . PAF (paroxysmal atrial fibrillation)     a. lone epidode in 2011 according to notes.  . Temporal arteritis 2011    a. followed @ [facility]; potential flareup 10/28/2012/notes 10/28/2012  . Giant cell arteritis    Past Surgical History  Procedure Laterality Date  . Bladder suspension  2001  . Hemiarthroplasty hip Right 2012    . Colonoscopy  01/26/2012    Procedure: COLONOSCOPY;  Surgeon: Ladene Artist, MD,FACG;  Location: [facility] ENDOSCOPY;  Service: Endoscopy;  Laterality: N/A;  note the EGD is possible  . Esophagogastroduodenoscopy  01/26/2012    Procedure: ESOPHAGOGASTRODUODENOSCOPY (EGD);  Surgeon: Ladene Artist, MD,FACG;  Location: [facility] ENDOSCOPY;  Service: Endoscopy;  Laterality: N/A;  . Cataract extraction w/ intraocular lens implant Right ~ 08/2012  . Temporal artery biopsy / ligation Bilateral 2011  . Tubal ligation  ~ 1982  . X-stop implantation  ~ 2010    "lower back" (10/28/2012)  . Abdominal hysterectomy  ~ 1984    vaginally  . Tonsillectomy and adenoidectomy      at age 79  . Laparoscopic cholecystectomy  2001  . Coronary angioplasty with stent placement  2006  . Back surgery  7-37yrs ago    X Stop  . Breast biopsy Right   . Moles removed that required stiches      one on leg and one on face  . Epidural block injection    . Lumbar fusion  03/2013    reports that she has never smoked. She has never used smokeless tobacco. She reports that she drinks alcohol. She reports that she does not use illicit drugs. family history includes Brain cancer (age of onset: 30) in her mother; Breast cancer (age of onset: 18) in her daughter; Heart attack in her father; Heart disease in her other; Hypertension in her other. There is no history of Arthritis or Colon cancer. Allergies  Allergen Reactions  . Dexilant [Dexlansoprazole] Diarrhea  . Dextromethorphan Rash      Review of Systems  Constitutional: Negative for fever and chills.       Objective:   Physical Exam  Constitutional: She appears well-developed and well-nourished.  Cardiovascular: Normal rate.   Pulmonary/Chest: Effort normal and breath sounds normal. No respiratory distress. She has no wheezes. She has no rales.  Skin:  Left biceps reveals area of ecchymosis about 6 x 5 cm. No hematoma. Nontender          Assessment & Plan:   Contusion left arm. No palpable hematoma. Patient on Plavix. No concerns for other bleeding problem. Reassurance.

## 2013-09-26 ENCOUNTER — Other Ambulatory Visit (INDEPENDENT_AMBULATORY_CARE_PROVIDER_SITE_OTHER): Payer: Medicare Other

## 2013-09-26 ENCOUNTER — Telehealth: Payer: Self-pay | Admitting: Family Medicine

## 2013-09-26 DIAGNOSIS — M316 Other giant cell arteritis: Secondary | ICD-10-CM

## 2013-09-26 DIAGNOSIS — Z79899 Other long term (current) drug therapy: Secondary | ICD-10-CM | POA: Diagnosis not present

## 2013-09-26 LAB — SEDIMENTATION RATE: Sed Rate: 8 mm/hr (ref 0–22)

## 2013-09-26 LAB — HIGH SENSITIVITY CRP: CRP HIGH SENSITIVITY: 1.03 mg/L (ref 0.000–5.000)

## 2013-09-26 NOTE — Telephone Encounter (Signed)
Pt informed

## 2013-09-26 NOTE — Telephone Encounter (Signed)
Pt leaving to go out of town, wants to know if dr. Elease Hashimoto can call her in an antibiotic for her sore throat.

## 2013-09-26 NOTE — Telephone Encounter (Signed)
Left message for patient to return call.

## 2013-09-26 NOTE — Telephone Encounter (Signed)
Vast majority of sore throats in adults > 30 are VIRAL.  Recommend symptomatic treatment.  Antibiotics will not help unless unlikely Strep.

## 2013-10-06 DIAGNOSIS — M316 Other giant cell arteritis: Secondary | ICD-10-CM | POA: Diagnosis not present

## 2013-10-06 DIAGNOSIS — Z79899 Other long term (current) drug therapy: Secondary | ICD-10-CM | POA: Diagnosis not present

## 2013-10-23 ENCOUNTER — Other Ambulatory Visit: Payer: Medicare Other

## 2013-10-24 ENCOUNTER — Other Ambulatory Visit (INDEPENDENT_AMBULATORY_CARE_PROVIDER_SITE_OTHER): Payer: Medicare Other

## 2013-10-24 DIAGNOSIS — R209 Unspecified disturbances of skin sensation: Secondary | ICD-10-CM | POA: Diagnosis not present

## 2013-10-24 DIAGNOSIS — A059 Bacterial foodborne intoxication, unspecified: Secondary | ICD-10-CM | POA: Diagnosis not present

## 2013-10-24 DIAGNOSIS — Z79899 Other long term (current) drug therapy: Secondary | ICD-10-CM

## 2013-10-24 DIAGNOSIS — M316 Other giant cell arteritis: Secondary | ICD-10-CM

## 2013-10-24 DIAGNOSIS — R200 Anesthesia of skin: Secondary | ICD-10-CM

## 2013-10-24 LAB — CBC WITH DIFFERENTIAL/PLATELET
BASOS PCT: 0.4 % (ref 0.0–3.0)
Basophils Absolute: 0.1 10*3/uL (ref 0.0–0.1)
EOS ABS: 0 10*3/uL (ref 0.0–0.7)
Eosinophils Relative: 0.1 % (ref 0.0–5.0)
HCT: 39.1 % (ref 36.0–46.0)
Hemoglobin: 13 g/dL (ref 12.0–15.0)
Lymphocytes Relative: 12.7 % (ref 12.0–46.0)
Lymphs Abs: 1.6 10*3/uL (ref 0.7–4.0)
MCHC: 33.2 g/dL (ref 30.0–36.0)
MCV: 89.4 fl (ref 78.0–100.0)
Monocytes Absolute: 0.7 10*3/uL (ref 0.1–1.0)
Monocytes Relative: 5.5 % (ref 3.0–12.0)
NEUTROS ABS: 10.1 10*3/uL — AB (ref 1.4–7.7)
Neutrophils Relative %: 81.3 % — ABNORMAL HIGH (ref 43.0–77.0)
Platelets: 240 10*3/uL (ref 150.0–400.0)
RBC: 4.38 Mil/uL (ref 3.87–5.11)
RDW: 15.9 % — ABNORMAL HIGH (ref 11.5–15.5)
WBC: 12.5 10*3/uL — ABNORMAL HIGH (ref 4.0–10.5)

## 2013-10-24 LAB — COMPREHENSIVE METABOLIC PANEL
ALK PHOS: 67 U/L (ref 39–117)
ALT: 36 U/L — ABNORMAL HIGH (ref 0–35)
AST: 34 U/L (ref 0–37)
Albumin: 4.2 g/dL (ref 3.5–5.2)
BILIRUBIN TOTAL: 0.9 mg/dL (ref 0.2–1.2)
BUN: 13 mg/dL (ref 6–23)
CO2: 29 mEq/L (ref 19–32)
CREATININE: 0.8 mg/dL (ref 0.4–1.2)
Calcium: 9.2 mg/dL (ref 8.4–10.5)
Chloride: 95 mEq/L — ABNORMAL LOW (ref 96–112)
GFR: 72.82 mL/min (ref >60.00)
Glucose, Bld: 116 mg/dL — ABNORMAL HIGH (ref 70–99)
Potassium: 3.3 mEq/L — ABNORMAL LOW (ref 3.5–5.1)
Sodium: 132 mEq/L — ABNORMAL LOW (ref 135–145)
Total Protein: 6.9 g/dL (ref 6.0–8.3)

## 2013-10-24 LAB — HIGH SENSITIVITY CRP: CRP, High Sensitivity: 1.03 mg/L (ref 0.000–5.000)

## 2013-10-24 LAB — SEDIMENTATION RATE: Sed Rate: 11 mm/hr (ref 0–22)

## 2013-10-26 ENCOUNTER — Other Ambulatory Visit: Payer: Self-pay

## 2013-10-26 MED ORDER — POTASSIUM CHLORIDE ER 10 MEQ PO TBCR: 10.0000 meq | EXTENDED_RELEASE_TABLET | Freq: Every day | ORAL | Status: DC

## 2013-11-02 ENCOUNTER — Other Ambulatory Visit: Payer: Self-pay | Admitting: Family Medicine

## 2013-11-13 ENCOUNTER — Encounter: Payer: Self-pay | Admitting: Family Medicine

## 2013-11-13 ENCOUNTER — Other Ambulatory Visit: Payer: Self-pay | Admitting: Family Medicine

## 2013-11-13 ENCOUNTER — Ambulatory Visit (INDEPENDENT_AMBULATORY_CARE_PROVIDER_SITE_OTHER): Payer: Medicare Other | Admitting: Family Medicine

## 2013-11-13 VITALS — BP 130/76 | HR 75 | Wt 158.0 lb

## 2013-11-13 DIAGNOSIS — E785 Hyperlipidemia, unspecified: Secondary | ICD-10-CM | POA: Diagnosis not present

## 2013-11-13 DIAGNOSIS — I2510 Atherosclerotic heart disease of native coronary artery without angina pectoris: Secondary | ICD-10-CM | POA: Diagnosis not present

## 2013-11-13 DIAGNOSIS — G4700 Insomnia, unspecified: Secondary | ICD-10-CM | POA: Diagnosis not present

## 2013-11-13 DIAGNOSIS — M316 Other giant cell arteritis: Secondary | ICD-10-CM

## 2013-11-13 DIAGNOSIS — I10 Essential (primary) hypertension: Secondary | ICD-10-CM | POA: Diagnosis not present

## 2013-11-13 DIAGNOSIS — Z79899 Other long term (current) drug therapy: Secondary | ICD-10-CM

## 2013-11-13 DIAGNOSIS — IMO0002 Reserved for concepts with insufficient information to code with codable children: Secondary | ICD-10-CM

## 2013-11-13 LAB — HEPATIC FUNCTION PANEL
ALT: 22 U/L (ref 0–35)
AST: 25 U/L (ref 0–37)
Albumin: 3.9 g/dL (ref 3.5–5.2)
Alkaline Phosphatase: 74 U/L (ref 39–117)
BILIRUBIN TOTAL: 1.3 mg/dL — AB (ref 0.2–1.2)
Bilirubin, Direct: 0.1 mg/dL (ref 0.0–0.3)
Total Protein: 6.8 g/dL (ref 6.0–8.3)

## 2013-11-13 LAB — BASIC METABOLIC PANEL WITH GFR
BUN: 10 mg/dL (ref 6–23)
CO2: 30 meq/L (ref 19–32)
Calcium: 9.1 mg/dL (ref 8.4–10.5)
Chloride: 96 meq/L (ref 96–112)
Creatinine, Ser: 0.8 mg/dL (ref 0.4–1.2)
GFR: 79.57 mL/min
Glucose, Bld: 81 mg/dL (ref 70–99)
Potassium: 3.4 meq/L — ABNORMAL LOW (ref 3.5–5.1)
Sodium: 135 meq/L (ref 135–145)

## 2013-11-13 LAB — LIPID PANEL
Cholesterol: 148 mg/dL (ref 0–200)
HDL: 68.7 mg/dL
LDL Cholesterol: 66 mg/dL (ref 0–99)
NonHDL: 79.3
Total CHOL/HDL Ratio: 2
Triglycerides: 65 mg/dL (ref 0.0–149.0)
VLDL: 13 mg/dL (ref 0.0–40.0)

## 2013-11-13 NOTE — Progress Notes (Signed)
Pre visit review using our clinic review tool, if applicable. No additional management support is needed unless otherwise documented below in the visit note. 

## 2013-11-13 NOTE — Progress Notes (Signed)
Subjective:    Patient ID: Betty Jackson, female    DOB: [birth year], 79 y.o.   MRN: [id number]  HPI Patient seen for routine medical followup. She has history of CAD, hypertension, hyperlipidemia, depression, GERD, temporal arteritis followed by rheumatologist in [place].  chronic insomnia. She had back surgery in the past year has had strong recovery. She's gained back previously lost weight and feels much better overall. No recent chest pains.  Hypertension stable. No headaches. No dizziness. Hyperlipidemia treated with Lipitor. She has not had labs for lipids in a year. Medications reviewed and compliant with all.  Chronic steroids secondary to giant cell arteritis.  Takes calcium and Vit D .  Could not tolerate Fosamax.    Past Medical History  Diagnosis Date  . Hyperlipidemia     takes Lipitor daily  . Incontinence of urine   . Depression   . Scoliosis   . Vocal cord dysfunction     "they don't operate properly" (10/28/2012)  . Diverticulosis   . Hemorrhoids   . Enteritis   . Anxiety     "more so the last few days" (10/28/2012)  . Exertional shortness of breath     "every now and then" (10/28/2012)  . Arthritis      Right hemiarthroplasty after hip fracture 06/2011  . DDD (degenerative disc disease)   . Arthritis     "hands" (10/28/2012)  . Chronic lower back pain     scoliosis  . Anxiety state, unspecified 10/28/2012  . Hypertension     takes Amlodipine,Losartan,Metoprolol,and HCTZ daily  . Insomnia     takes Restoril nightly  . Migraines     "back in my 20's; none since" (10/28/2012)  . Joint pain   . Osteopenia   . Osteoporosis   . Bruises easily     d/t being on prednisone and plavix  . History of scabies   . Diverticulitis     hx of  . Urinary frequency   . Cataract     immature on the left eye  . Complication of anesthesia     pt has a very high tolerance to meds  . CAD (coronary artery disease)     a. Stent RCA in [facility];  b. Cath approx 2009 - nonobs  per pt report.  . Sinus bradycardia     a. on chronic bb  . PAF (paroxysmal atrial fibrillation)     a. lone epidode in 2011 according to notes.  . Temporal arteritis 2011    a. followed @ [facility]; potential flareup 10/28/2012/notes 10/28/2012  . Giant cell arteritis    Past Surgical History  Procedure Laterality Date  . Bladder suspension  2001  . Hemiarthroplasty hip Right 2012  . Colonoscopy  01/26/2012    Procedure: COLONOSCOPY;  Surgeon: Ladene Artist, MD,FACG;  Location: [facility] ENDOSCOPY;  Service: Endoscopy;  Laterality: N/A;  note the EGD is possible  . Esophagogastroduodenoscopy  01/26/2012    Procedure: ESOPHAGOGASTRODUODENOSCOPY (EGD);  Surgeon: Ladene Artist, MD,FACG;  Location: [facility] ENDOSCOPY;  Service: Endoscopy;  Laterality: N/A;  . Cataract extraction w/ intraocular lens implant Right ~ 08/2012  . Temporal artery biopsy / ligation Bilateral 2011  . Tubal ligation  ~ 1982  . X-stop implantation  ~ 2010    "lower back" (10/28/2012)  . Abdominal hysterectomy  ~ 1984    vaginally  . Tonsillectomy and adenoidectomy      at age 62  . Laparoscopic cholecystectomy  2001  .  Coronary angioplasty with stent placement  2006  . Back surgery  7-1yrs ago    X Stop  . Breast biopsy Right   . Moles removed that required stiches      one on leg and one on face  . Epidural block injection    . Lumbar fusion  03/2013    reports that she has never smoked. She has never used smokeless tobacco. She reports that she drinks alcohol. She reports that she does not use illicit drugs. family history includes Brain cancer (age of onset: 41) in her mother; Breast cancer (age of onset: 61) in her daughter; Heart attack in her father; Heart disease in her other; Hypertension in her other. There is no history of Arthritis or Colon cancer. Allergies  Allergen Reactions  . Dexilant [Dexlansoprazole] Diarrhea  . Dextromethorphan Rash  ]     Review of Systems  Constitutional: Negative for fatigue.    Eyes: Negative for visual disturbance.  Respiratory: Negative for cough, chest tightness, shortness of breath and wheezing.   Cardiovascular: Negative for chest pain, palpitations and leg swelling.  Endocrine: Negative for polydipsia and polyuria.  Genitourinary: Negative for dysuria.  Neurological: Negative for dizziness, seizures, syncope, weakness, light-headedness and headaches.  Hematological: Negative for adenopathy.  Psychiatric/Behavioral: Negative for dysphoric mood.       Objective:   Physical Exam  Constitutional: She appears well-developed and well-nourished. No distress.  Neck: Neck supple. No thyromegaly present.  Cardiovascular: Normal rate and regular rhythm.   Pulmonary/Chest: Effort normal and breath sounds normal. No respiratory distress. She has no wheezes. She has no rales.  Musculoskeletal: She exhibits no edema.  Lymphadenopathy:    She has no cervical adenopathy.          Assessment & Plan:  #1 hypertension. Stable. Continue current medications. Check basic metabolic panel #2 hyperlipidemia. Check lipid and hepatic panel. Continue Lipitor #3 chronic steroid use. We reiterated importance of regular calcium and vitamin D. She's been intolerant of Fosamax in the past

## 2013-11-13 NOTE — Patient Instructions (Signed)
Remember Flu vaccine this Fall.

## 2013-11-14 ENCOUNTER — Telehealth: Payer: Self-pay | Admitting: Family Medicine

## 2013-11-14 NOTE — Telephone Encounter (Signed)
Relevant patient education assigned to patient using Emmi. ° °

## 2013-11-30 ENCOUNTER — Other Ambulatory Visit (INDEPENDENT_AMBULATORY_CARE_PROVIDER_SITE_OTHER): Payer: Medicare Other

## 2013-11-30 DIAGNOSIS — M316 Other giant cell arteritis: Secondary | ICD-10-CM

## 2013-11-30 DIAGNOSIS — Z79899 Other long term (current) drug therapy: Secondary | ICD-10-CM

## 2013-11-30 LAB — COMPREHENSIVE METABOLIC PANEL
ALBUMIN: 4 g/dL (ref 3.5–5.2)
ALT: 23 U/L (ref 0–35)
AST: 32 U/L (ref 0–37)
Alkaline Phosphatase: 68 U/L (ref 39–117)
BUN: 10 mg/dL (ref 6–23)
CHLORIDE: 91 meq/L — AB (ref 96–112)
CO2: 27 meq/L (ref 19–32)
Calcium: 9 mg/dL (ref 8.4–10.5)
Creatinine, Ser: 0.8 mg/dL (ref 0.4–1.2)
GFR: 73.85 mL/min (ref >60.00)
GLUCOSE: 148 mg/dL — AB (ref 70–99)
POTASSIUM: 3.5 meq/L (ref 3.5–5.1)
Sodium: 128 mEq/L — ABNORMAL LOW (ref 135–145)
Total Bilirubin: 1.2 mg/dL (ref 0.2–1.2)
Total Protein: 6.7 g/dL (ref 6.0–8.3)

## 2013-11-30 LAB — CBC WITH DIFFERENTIAL/PLATELET
BASOS PCT: 0.4 % (ref 0.0–3.0)
Basophils Absolute: 0.1 10*3/uL (ref 0.0–0.1)
EOS PCT: 0 % (ref 0.0–5.0)
Eosinophils Absolute: 0 10*3/uL (ref 0.0–0.7)
HCT: 37.3 % (ref 36.0–46.0)
Hemoglobin: 12.6 g/dL (ref 12.0–15.0)
Lymphocytes Relative: 9.8 % — ABNORMAL LOW (ref 12.0–46.0)
Lymphs Abs: 1.3 10*3/uL (ref 0.7–4.0)
MCHC: 33.9 g/dL (ref 30.0–36.0)
MCV: 89.8 fl (ref 78.0–100.0)
Monocytes Absolute: 0.4 10*3/uL (ref 0.1–1.0)
Monocytes Relative: 3.3 % (ref 3.0–12.0)
Neutro Abs: 11.2 10*3/uL — ABNORMAL HIGH (ref 1.4–7.7)
Platelets: 222 10*3/uL (ref 150.0–400.0)
RBC: 4.15 Mil/uL (ref 3.87–5.11)
RDW: 13.8 % (ref 11.5–15.5)
WBC: 12.9 10*3/uL — AB (ref 4.0–10.5)

## 2013-11-30 LAB — HIGH SENSITIVITY CRP: CRP HIGH SENSITIVITY: 0.75 mg/L (ref 0.000–5.000)

## 2013-11-30 LAB — SEDIMENTATION RATE: SED RATE: 9 mm/h (ref 0–22)

## 2013-12-01 ENCOUNTER — Telehealth: Payer: Self-pay | Admitting: Family Medicine

## 2013-12-01 NOTE — Telephone Encounter (Signed)
Pt had labs drawn yesterday and would like results

## 2013-12-01 NOTE — Telephone Encounter (Signed)
Released results to mychart.

## 2013-12-14 ENCOUNTER — Other Ambulatory Visit: Payer: Self-pay | Admitting: Family Medicine

## 2013-12-16 ENCOUNTER — Other Ambulatory Visit: Payer: Self-pay | Admitting: Family Medicine

## 2013-12-25 ENCOUNTER — Encounter: Payer: Self-pay | Admitting: Family Medicine

## 2013-12-25 ENCOUNTER — Ambulatory Visit (INDEPENDENT_AMBULATORY_CARE_PROVIDER_SITE_OTHER): Payer: Medicare Other | Admitting: Family Medicine

## 2013-12-25 VITALS — BP 130/72 | HR 76 | Temp 98.0°F | Wt 155.0 lb

## 2013-12-25 DIAGNOSIS — S8012XA Contusion of left lower leg, initial encounter: Secondary | ICD-10-CM

## 2013-12-25 DIAGNOSIS — I2510 Atherosclerotic heart disease of native coronary artery without angina pectoris: Secondary | ICD-10-CM | POA: Diagnosis not present

## 2013-12-25 DIAGNOSIS — Z23 Encounter for immunization: Secondary | ICD-10-CM

## 2013-12-25 NOTE — Progress Notes (Signed)
Subjective:    Patient ID: Betty Jackson, female    DOB: 1935/01/17, 79 y.o.   MRN: [id number]  HPI Patient seen with left leg bruising and some mild bilateral leg swelling and soreness. Recent history is that she started exercise program last week.  Day after exercise she noticed some bilateral leg soreness. She did notice some bruising of her left leg end of last week and over the weekend. She does not recall any specific injury. She had recent CBC was normal platelet count. She in general has some bruising which she attributes to prednisone use and also she is on Plavix. She has not had any other bleeding issues in terms of gum bleeding or hematuria or hematochezia. Generally feels well. No dyspnea. No difficulties with ambulation.  Past Medical History  Diagnosis Date  . Hyperlipidemia     takes Lipitor daily  . Incontinence of urine   . Depression   . Scoliosis   . Vocal cord dysfunction     "they don't operate properly" (10/28/2012)  . Diverticulosis   . Hemorrhoids   . Enteritis   . Anxiety     "more so the last few days" (10/28/2012)  . Exertional shortness of breath     "every now and then" (10/28/2012)  . Arthritis      Right hemiarthroplasty after hip fracture 06/2011  . DDD (degenerative disc disease)   . Arthritis     "hands" (10/28/2012)  . Chronic lower back pain     scoliosis  . Anxiety state, unspecified 10/28/2012  . Hypertension     takes Amlodipine,Losartan,Metoprolol,and HCTZ daily  . Insomnia     takes Restoril nightly  . Migraines     "back in my 20's; none since" (10/28/2012)  . Joint pain   . Osteopenia   . Osteoporosis   . Bruises easily     d/t being on prednisone and plavix  . History of scabies   . Diverticulitis     hx of  . Urinary frequency   . Cataract     immature on the left eye  . Complication of anesthesia     pt has a very high tolerance to meds  . CAD (coronary artery disease)     a. Stent RCA in [facility];  b. Cath approx 2009 -  nonobs per pt report.  . Sinus bradycardia     a. on chronic bb  . PAF (paroxysmal atrial fibrillation)     a. lone epidode in 2011 according to notes.  . Temporal arteritis 2011    a. followed @ [facility]; potential flareup 10/28/2012/notes 10/28/2012  . Giant cell arteritis    Past Surgical History  Procedure Laterality Date  . Bladder suspension  2001  . Hemiarthroplasty hip Right 2012  . Colonoscopy  01/26/2012    Procedure: COLONOSCOPY;  Surgeon: Ladene Artist, MD,FACG;  Location: [facility] ENDOSCOPY;  Service: Endoscopy;  Laterality: N/A;  note the EGD is possible  . Esophagogastroduodenoscopy  01/26/2012    Procedure: ESOPHAGOGASTRODUODENOSCOPY (EGD);  Surgeon: Ladene Artist, MD,FACG;  Location: [facility] ENDOSCOPY;  Service: Endoscopy;  Laterality: N/A;  . Cataract extraction w/ intraocular lens implant Right ~ 08/2012  . Temporal artery biopsy / ligation Bilateral 2011  . Tubal ligation  ~ 1982  . X-stop implantation  ~ 2010    "lower back" (10/28/2012)  . Abdominal hysterectomy  ~ 1984    vaginally  . Tonsillectomy and adenoidectomy      at  age 74  . Laparoscopic cholecystectomy  2001  . Coronary angioplasty with stent placement  2006  . Back surgery  7-56yrs ago    X Stop  . Breast biopsy Right   . Moles removed that required stiches      one on leg and one on face  . Epidural block injection    . Lumbar fusion  03/2013    reports that she has never smoked. She has never used smokeless tobacco. She reports that she drinks alcohol. She reports that she does not use illicit drugs. family history includes Brain cancer (age of onset: 50) in her mother; Breast cancer (age of onset: 78) in her daughter; Heart attack in her father; Heart disease in her other; Hypertension in her other. There is no history of Arthritis or Colon cancer. Allergies  Allergen Reactions  . Dexilant [Dexlansoprazole] Diarrhea  . Dextromethorphan Rash      Review of Systems  Constitutional: Negative for appetite  change and unexpected weight change.  Respiratory: Negative for shortness of breath.   Cardiovascular: Negative for chest pain.  Hematological: Negative for adenopathy. Bruises/bleeds easily.       Objective:   Physical Exam  Constitutional: She appears well-developed and well-nourished.  Cardiovascular: Normal rate and regular rhythm.   Pulmonary/Chest: Effort normal and breath sounds normal. No respiratory distress. She has no wheezes. She has no rales.  Musculoskeletal:  Left leg reveals some diffuse ecchymosis anterior and medial. She has only minimal tenderness along the medial head of the gastrocnemius muscle but no hematoma. She has full range of motion with plantar flexion dorsiflexion. No bony tenderness. No pitting edema Good distal foot pulses  Neurological:  Full-strength lower extremities          Assessment & Plan:  Ecchymosis left leg. No specific history of injury. She has not had any more concerning generalized ecchymosis and recent platelet count normal. Suspect benign spontaneous ecchymosis. We did not see any reason to limit her current exercise program. Reassurance and followup as needed

## 2013-12-25 NOTE — Progress Notes (Signed)
Pre visit review using our clinic review tool, if applicable. No additional management support is needed unless otherwise documented below in the visit note. 

## 2013-12-26 ENCOUNTER — Other Ambulatory Visit: Payer: Self-pay | Admitting: Family Medicine

## 2014-01-01 ENCOUNTER — Other Ambulatory Visit (INDEPENDENT_AMBULATORY_CARE_PROVIDER_SITE_OTHER): Payer: Medicare Other

## 2014-01-01 DIAGNOSIS — M316 Other giant cell arteritis: Secondary | ICD-10-CM | POA: Diagnosis not present

## 2014-01-01 DIAGNOSIS — I2510 Atherosclerotic heart disease of native coronary artery without angina pectoris: Secondary | ICD-10-CM

## 2014-01-01 DIAGNOSIS — Z79899 Other long term (current) drug therapy: Secondary | ICD-10-CM | POA: Diagnosis not present

## 2014-01-01 LAB — COMPREHENSIVE METABOLIC PANEL
ALBUMIN: 4.1 g/dL (ref 3.5–5.2)
ALT: 21 U/L (ref 0–35)
AST: 26 U/L (ref 0–37)
Alkaline Phosphatase: 67 U/L (ref 39–117)
BUN: 12 mg/dL (ref 6–23)
CO2: 27 mEq/L (ref 19–32)
Calcium: 9 mg/dL (ref 8.4–10.5)
Chloride: 96 mEq/L (ref 96–112)
Creatinine, Ser: 0.8 mg/dL (ref 0.4–1.2)
GFR: 77.16 mL/min (ref >60.00)
Glucose, Bld: 89 mg/dL (ref 70–99)
Potassium: 4.4 mEq/L (ref 3.5–5.1)
SODIUM: 131 meq/L — AB (ref 135–145)
TOTAL PROTEIN: 7 g/dL (ref 6.0–8.3)
Total Bilirubin: 0.8 mg/dL (ref 0.2–1.2)

## 2014-01-01 LAB — CBC WITH DIFFERENTIAL/PLATELET
BASOS ABS: 0 10*3/uL (ref 0.0–0.1)
Basophils Relative: 0.4 % (ref 0.0–3.0)
Eosinophils Absolute: 0 10*3/uL (ref 0.0–0.7)
Eosinophils Relative: 0.1 % (ref 0.0–5.0)
HCT: 37.7 % (ref 36.0–46.0)
HEMOGLOBIN: 12.8 g/dL (ref 12.0–15.0)
LYMPHS PCT: 23.8 % (ref 12.0–46.0)
Lymphs Abs: 2.4 10*3/uL (ref 0.7–4.0)
MCHC: 33.9 g/dL (ref 30.0–36.0)
MCV: 89.3 fl (ref 78.0–100.0)
Monocytes Absolute: 0.9 10*3/uL (ref 0.1–1.0)
Monocytes Relative: 9.3 % (ref 3.0–12.0)
Neutro Abs: 6.6 10*3/uL (ref 1.4–7.7)
Neutrophils Relative %: 66.4 % (ref 43.0–77.0)
Platelets: 261 10*3/uL (ref 150.0–400.0)
RBC: 4.22 Mil/uL (ref 3.87–5.11)
RDW: 13.4 % (ref 11.5–15.5)
WBC: 9.9 10*3/uL (ref 4.0–10.5)

## 2014-01-01 LAB — HIGH SENSITIVITY CRP: CRP HIGH SENSITIVITY: 1.71 mg/L (ref 0.000–5.000)

## 2014-01-01 LAB — SEDIMENTATION RATE: Sed Rate: 11 mm/hr (ref 0–22)

## 2014-01-23 ENCOUNTER — Other Ambulatory Visit (INDEPENDENT_AMBULATORY_CARE_PROVIDER_SITE_OTHER): Payer: Medicare Other

## 2014-01-23 DIAGNOSIS — M316 Other giant cell arteritis: Secondary | ICD-10-CM

## 2014-01-23 DIAGNOSIS — Z79899 Other long term (current) drug therapy: Secondary | ICD-10-CM

## 2014-01-23 DIAGNOSIS — I2510 Atherosclerotic heart disease of native coronary artery without angina pectoris: Secondary | ICD-10-CM | POA: Diagnosis not present

## 2014-01-23 LAB — COMPREHENSIVE METABOLIC PANEL
ALK PHOS: 68 U/L (ref 39–117)
ALT: 23 U/L (ref 0–35)
AST: 26 U/L (ref 0–37)
Albumin: 3.6 g/dL (ref 3.5–5.2)
BUN: 15 mg/dL (ref 6–23)
CO2: 21 meq/L (ref 19–32)
Calcium: 9 mg/dL (ref 8.4–10.5)
Chloride: 97 mEq/L (ref 96–112)
Creatinine, Ser: 0.8 mg/dL (ref 0.4–1.2)
GFR: 70.75 mL/min (ref >60.00)
GLUCOSE: 78 mg/dL (ref 70–99)
Potassium: 5 mEq/L (ref 3.5–5.1)
SODIUM: 133 meq/L — AB (ref 135–145)
TOTAL PROTEIN: 6.9 g/dL (ref 6.0–8.3)
Total Bilirubin: 0.4 mg/dL (ref 0.2–1.2)

## 2014-01-23 LAB — CBC WITH DIFFERENTIAL/PLATELET
BASOS PCT: 0.6 % (ref 0.0–3.0)
Basophils Absolute: 0.1 10*3/uL (ref 0.0–0.1)
EOS PCT: 0 % (ref 0.0–5.0)
Eosinophils Absolute: 0 10*3/uL (ref 0.0–0.7)
HEMATOCRIT: 37 % (ref 36.0–46.0)
Hemoglobin: 12.3 g/dL (ref 12.0–15.0)
LYMPHS ABS: 2.8 10*3/uL (ref 0.7–4.0)
Lymphocytes Relative: 29.7 % (ref 12.0–46.0)
MCHC: 33.3 g/dL (ref 30.0–36.0)
MCV: 90.2 fl (ref 78.0–100.0)
MONO ABS: 0.8 10*3/uL (ref 0.1–1.0)
MONOS PCT: 9 % (ref 3.0–12.0)
Neutro Abs: 5.6 10*3/uL (ref 1.4–7.7)
Neutrophils Relative %: 60.7 % (ref 43.0–77.0)
Platelets: 210 10*3/uL (ref 150.0–400.0)
RBC: 4.11 Mil/uL (ref 3.87–5.11)
RDW: 14 % (ref 11.5–15.5)
WBC: 9.3 10*3/uL (ref 4.0–10.5)

## 2014-01-23 LAB — HIGH SENSITIVITY CRP: CRP HIGH SENSITIVITY: 0.87 mg/L (ref 0.000–5.000)

## 2014-01-23 LAB — SEDIMENTATION RATE: Sed Rate: 10 mm/hr (ref 0–22)

## 2014-01-25 ENCOUNTER — Other Ambulatory Visit: Payer: Self-pay | Admitting: Family Medicine

## 2014-01-29 ENCOUNTER — Other Ambulatory Visit: Payer: Self-pay | Admitting: Family Medicine

## 2014-01-29 DIAGNOSIS — M25562 Pain in left knee: Secondary | ICD-10-CM | POA: Diagnosis not present

## 2014-01-29 DIAGNOSIS — M25561 Pain in right knee: Secondary | ICD-10-CM | POA: Diagnosis not present

## 2014-01-29 NOTE — Telephone Encounter (Signed)
Last visit 12/25/13 Last refill 07/27/13 #60 2 refill

## 2014-01-29 NOTE — Telephone Encounter (Signed)
Refill #30 with 2 refills 

## 2014-02-09 DIAGNOSIS — M25562 Pain in left knee: Secondary | ICD-10-CM | POA: Diagnosis not present

## 2014-02-15 DIAGNOSIS — M25561 Pain in right knee: Secondary | ICD-10-CM | POA: Diagnosis not present

## 2014-02-27 ENCOUNTER — Other Ambulatory Visit (INDEPENDENT_AMBULATORY_CARE_PROVIDER_SITE_OTHER): Payer: Medicare Other

## 2014-02-27 DIAGNOSIS — M316 Other giant cell arteritis: Secondary | ICD-10-CM | POA: Diagnosis not present

## 2014-02-27 DIAGNOSIS — I2510 Atherosclerotic heart disease of native coronary artery without angina pectoris: Secondary | ICD-10-CM | POA: Diagnosis not present

## 2014-02-27 DIAGNOSIS — Z79899 Other long term (current) drug therapy: Secondary | ICD-10-CM | POA: Diagnosis not present

## 2014-02-27 LAB — SEDIMENTATION RATE: Sed Rate: 13 mm/hr (ref 0–22)

## 2014-02-28 LAB — HIGH SENSITIVITY CRP: CRP, High Sensitivity: 1.15 mg/L (ref 0.000–5.000)

## 2014-03-17 ENCOUNTER — Other Ambulatory Visit: Payer: Self-pay | Admitting: Family Medicine

## 2014-04-02 ENCOUNTER — Other Ambulatory Visit (INDEPENDENT_AMBULATORY_CARE_PROVIDER_SITE_OTHER): Payer: Medicare Other

## 2014-04-02 DIAGNOSIS — I2510 Atherosclerotic heart disease of native coronary artery without angina pectoris: Secondary | ICD-10-CM | POA: Diagnosis not present

## 2014-04-02 DIAGNOSIS — M316 Other giant cell arteritis: Secondary | ICD-10-CM

## 2014-04-02 DIAGNOSIS — Z79899 Other long term (current) drug therapy: Secondary | ICD-10-CM

## 2014-04-02 LAB — SEDIMENTATION RATE: Sed Rate: 9 mm/hr (ref 0–22)

## 2014-04-02 LAB — HIGH SENSITIVITY CRP: CRP HIGH SENSITIVITY: 1.27 mg/L (ref 0.000–5.000)

## 2014-04-10 DIAGNOSIS — M316 Other giant cell arteritis: Secondary | ICD-10-CM | POA: Diagnosis not present

## 2014-04-10 DIAGNOSIS — Z79899 Other long term (current) drug therapy: Secondary | ICD-10-CM | POA: Diagnosis not present

## 2014-04-23 ENCOUNTER — Other Ambulatory Visit: Payer: Self-pay | Admitting: Family Medicine

## 2014-04-23 ENCOUNTER — Telehealth: Payer: Self-pay | Admitting: Family Medicine

## 2014-04-23 MED ORDER — METOPROLOL TARTRATE 25 MG PO TABS: 25.0000 mg | ORAL_TABLET | Freq: Two times a day (BID) | ORAL | Status: DC

## 2014-04-23 NOTE — Telephone Encounter (Signed)
Rx sent to pharmacy   

## 2014-04-23 NOTE — Telephone Encounter (Signed)
Pt request refill of the following: metoprolol tartrate (LOPRESSOR) 25 MG tablet   Phamacy: [facility]

## 2014-04-24 ENCOUNTER — Other Ambulatory Visit: Payer: Self-pay | Admitting: Family Medicine

## 2014-04-25 NOTE — Telephone Encounter (Signed)
Refill for 3 months. 

## 2014-04-25 NOTE — Telephone Encounter (Signed)
Last visit 12/25/13 Last refill 01/29/14 #30 2 refill

## 2014-04-28 ENCOUNTER — Other Ambulatory Visit: Payer: Self-pay | Admitting: Family Medicine

## 2014-05-07 ENCOUNTER — Other Ambulatory Visit (INDEPENDENT_AMBULATORY_CARE_PROVIDER_SITE_OTHER): Payer: Medicare Other

## 2014-05-07 DIAGNOSIS — Z79899 Other long term (current) drug therapy: Secondary | ICD-10-CM

## 2014-05-07 DIAGNOSIS — M316 Other giant cell arteritis: Secondary | ICD-10-CM | POA: Diagnosis not present

## 2014-05-07 LAB — HIGH SENSITIVITY CRP: CRP, High Sensitivity: 1.44 mg/L (ref 0.000–5.000)

## 2014-05-07 LAB — SEDIMENTATION RATE: SED RATE: 8 mm/h (ref 0–22)

## 2014-05-14 ENCOUNTER — Ambulatory Visit: Payer: Medicare Other | Admitting: Family Medicine

## 2014-05-18 ENCOUNTER — Encounter: Payer: Self-pay | Admitting: Family Medicine

## 2014-05-18 ENCOUNTER — Ambulatory Visit (INDEPENDENT_AMBULATORY_CARE_PROVIDER_SITE_OTHER): Payer: Medicare Other | Admitting: Family Medicine

## 2014-05-18 VITALS — BP 132/80 | HR 62 | Temp 97.4°F | Wt 165.0 lb

## 2014-05-18 DIAGNOSIS — F5104 Psychophysiologic insomnia: Secondary | ICD-10-CM

## 2014-05-18 DIAGNOSIS — E876 Hypokalemia: Secondary | ICD-10-CM | POA: Diagnosis not present

## 2014-05-18 DIAGNOSIS — E785 Hyperlipidemia, unspecified: Secondary | ICD-10-CM | POA: Diagnosis not present

## 2014-05-18 DIAGNOSIS — G4700 Insomnia, unspecified: Secondary | ICD-10-CM

## 2014-05-18 DIAGNOSIS — I10 Essential (primary) hypertension: Secondary | ICD-10-CM

## 2014-05-18 NOTE — Progress Notes (Signed)
Pre visit review using our clinic review tool, if applicable. No additional management support is needed unless otherwise documented below in the visit note. 

## 2014-05-18 NOTE — Progress Notes (Signed)
Subjective:    Patient ID: Betty Jackson, female    DOB: 1934-04-07, 80 y.o.   MRN: [id number]  HPI Patient seen for routine medical follow-up. She has multiple chronic problems including history of temporal arteritis (currently followed by rheumatology), CAD, history of CVA, hypertension, hyperlipidemia, lumbar stenosis, history of depression, history of transient atrial fibrillation, chronic insomnia. She is in slow taper off prednisone for temporal arteritis. Visual symptoms are stable. She does have a cataract in her left eye and is needing ophthalmology referral. Her prednisone taper is being managed by rheumatology. She is currently not monitoring blood sugars. No history of diabetes.  Blood pressure been stable. No dizziness. No falls. She remains on atorvastatin for hyperlipidemia. Lipids were checked in August and at goal. She has history of mild hypokalemia. Not clear she is taking her potassium regularly. Overall feels well. She's had substantial back pains in the past couple years often unknown but currently stable  Past Medical History  Diagnosis Date  . Hyperlipidemia     takes Lipitor daily  . Incontinence of urine   . Depression   . Scoliosis   . Vocal cord dysfunction     "they don't operate properly" (10/28/2012)  . Diverticulosis   . Hemorrhoids   . Enteritis   . Anxiety     "more so the last few days" (10/28/2012)  . Exertional shortness of breath     "every now and then" (10/28/2012)  . Arthritis      Right hemiarthroplasty after hip fracture 06/2011  . DDD (degenerative disc disease)   . Arthritis     "hands" (10/28/2012)  . Chronic lower back pain     scoliosis  . Anxiety state, unspecified 10/28/2012  . Hypertension     takes Amlodipine,Losartan,Metoprolol,and HCTZ daily  . Insomnia     takes Restoril nightly  . Migraines     "back in my 20's; none since" (10/28/2012)  . Joint pain   . Osteopenia   . Osteoporosis   . Bruises easily     d/t being on prednisone  and plavix  . History of scabies   . Diverticulitis     hx of  . Urinary frequency   . Cataract     immature on the left eye  . Complication of anesthesia     pt has a very high tolerance to meds  . CAD (coronary artery disease)     a. Stent RCA in [facility];  b. Cath approx 2009 - nonobs per pt report.  . Sinus bradycardia     a. on chronic bb  . PAF (paroxysmal atrial fibrillation)     a. lone epidode in 2011 according to notes.  . Temporal arteritis 2011    a. followed @ [facility]; potential flareup 10/28/2012/notes 10/28/2012  . Giant cell arteritis    Past Surgical History  Procedure Laterality Date  . Bladder suspension  2001  . Hemiarthroplasty hip Right 2012  . Colonoscopy  01/26/2012    Procedure: COLONOSCOPY;  Surgeon: Ladene Artist, MD,FACG;  Location: [facility] ENDOSCOPY;  Service: Endoscopy;  Laterality: N/A;  note the EGD is possible  . Esophagogastroduodenoscopy  01/26/2012    Procedure: ESOPHAGOGASTRODUODENOSCOPY (EGD);  Surgeon: Ladene Artist, MD,FACG;  Location[facility] ENDOSCOPY;  Service: Endoscopy;  Laterality: N/A;  . Cataract extraction w/ intraocular lens implant Right ~ 08/2012  . Temporal artery biopsy / ligation Bilateral 2011  . Tubal ligation  ~ 1982  . X-stop implantation  ~  2010    "lower back" (10/28/2012)  . Abdominal hysterectomy  ~ 1984    vaginally  . Tonsillectomy and adenoidectomy      at age 45  . Laparoscopic cholecystectomy  2001  . Coronary angioplasty with stent placement  2006  . Back surgery  7-58yrs ago    X Stop  . Breast biopsy Right   . Moles removed that required stiches      one on leg and one on face  . Epidural block injection    . Lumbar fusion  03/2013    reports that she has never smoked. She has never used smokeless tobacco. She reports that she drinks alcohol. She reports that she does not use illicit drugs. family history includes Brain cancer (age of onset: 73) in her mother; Breast cancer (age of onset: 28) in her daughter;  Heart attack in her father; Heart disease in her other; Hypertension in her other. There is no history of Arthritis or Colon cancer. Allergies  Allergen Reactions  . Dexilant [Dexlansoprazole] Diarrhea  . Dextromethorphan Rash      Review of Systems  Constitutional: Negative for fatigue and unexpected weight change.  Eyes: Negative for visual disturbance.  Respiratory: Negative for cough, chest tightness, shortness of breath and wheezing.   Cardiovascular: Negative for chest pain, palpitations and leg swelling.  Endocrine: Negative for polydipsia and polyuria.  Genitourinary: Negative for dysuria.  Neurological: Negative for dizziness, seizures, syncope, weakness, light-headedness and headaches.       Objective:   Physical Exam  Constitutional: She appears well-developed and well-nourished. No distress.  Neck: Neck supple. No thyromegaly present.  Cardiovascular: Normal rate and regular rhythm.   Pulmonary/Chest: Effort normal and breath sounds normal. No respiratory distress. She has no wheezes. She has no rales.  Musculoskeletal: She exhibits no edema.  Psychiatric: She has a normal mood and affect. Her behavior is normal.          Assessment & Plan:  #1 hypertension. Stable at goal. She does have history of mild hypokalemia and will recheck basic metabolic panel today #2 history of hyperlipidemia. Lipids were stable in August. Recheck at follow-up in 6 months  #3 history of CAD. She's had occasional fleeting pain nonexertional over past few months. She is encouraged follow-up close with cardiology #4 history of temporal arteritis. Followed by rheumatology #5 history of chronic insomnia. We have discussed sleep hygiene some detail past. She is on Restoril. We have encouraged tapering off in the past but she has been unable to do so

## 2014-06-05 ENCOUNTER — Other Ambulatory Visit (INDEPENDENT_AMBULATORY_CARE_PROVIDER_SITE_OTHER): Payer: Medicare Other

## 2014-06-05 ENCOUNTER — Encounter: Payer: Self-pay | Admitting: *Deleted

## 2014-06-05 DIAGNOSIS — E876 Hypokalemia: Secondary | ICD-10-CM

## 2014-06-05 DIAGNOSIS — M316 Other giant cell arteritis: Secondary | ICD-10-CM

## 2014-06-05 DIAGNOSIS — Z79899 Other long term (current) drug therapy: Secondary | ICD-10-CM

## 2014-06-05 DIAGNOSIS — I2510 Atherosclerotic heart disease of native coronary artery without angina pectoris: Secondary | ICD-10-CM

## 2014-06-05 LAB — SEDIMENTATION RATE: Sed Rate: 9 mm/hr (ref 0–22)

## 2014-06-05 LAB — BASIC METABOLIC PANEL
BUN: 13 mg/dL (ref 6–23)
CALCIUM: 9.3 mg/dL (ref 8.4–10.5)
CHLORIDE: 99 meq/L (ref 96–112)
CO2: 34 mEq/L — ABNORMAL HIGH (ref 19–32)
CREATININE: 0.88 mg/dL (ref 0.40–1.20)
GFR: 66.07 mL/min (ref >60.00)
Glucose, Bld: 90 mg/dL (ref 70–99)
Potassium: 4.4 mEq/L (ref 3.5–5.1)
Sodium: 135 mEq/L (ref 135–145)

## 2014-06-05 LAB — HIGH SENSITIVITY CRP: CRP HIGH SENSITIVITY: 0.68 mg/L (ref 0.000–5.000)

## 2014-06-07 ENCOUNTER — Other Ambulatory Visit: Payer: Self-pay | Admitting: Family Medicine

## 2014-06-13 DIAGNOSIS — H4720 Unspecified optic atrophy: Secondary | ICD-10-CM | POA: Diagnosis not present

## 2014-06-13 DIAGNOSIS — Z961 Presence of intraocular lens: Secondary | ICD-10-CM | POA: Diagnosis not present

## 2014-06-13 DIAGNOSIS — H2511 Age-related nuclear cataract, right eye: Secondary | ICD-10-CM | POA: Diagnosis not present

## 2014-06-15 ENCOUNTER — Telehealth: Payer: Self-pay | Admitting: Cardiovascular Disease

## 2014-06-15 NOTE — Telephone Encounter (Signed)
New Message  Pt called wanting earlier appt, was given earliest PA appt- 4/8 @ 1pm. Pt complained of infrequent chest pain that started a couple of weeks ago, Please call back and discuss.   Pt c/o of Chest Pain: 1. Are you having CP right now? No, but earlier today 2. Are you experiencing any other symptoms (ex. SOB, nausea, vomiting, sweating)? 'vomitted' a couple times, hot flash/sweat couple of times 3. How long have you been experiencing CP? couple of weeks 4. Is your CP continuous or coming and going? Comes and goes 5. Have you taken Nitroglycerin? Has it but has not taken it, never have

## 2014-06-15 NOTE — Telephone Encounter (Signed)
Spoke with pt. She reports episodes of chest pain over the last couple of weeks.  Pain usually lasts about 5 mins and goes away on it's own.  Last episode was earlier today.  She has had pain on both sides of chest.  She is not sure how often pain occurs.  Pain is not associated with activity.  Appt made for pt to see Dr. Angelena Form on 06/19/14 at 10:45.  Pt aware to go to ED if symptoms worsen.

## 2014-06-19 ENCOUNTER — Ambulatory Visit (INDEPENDENT_AMBULATORY_CARE_PROVIDER_SITE_OTHER): Payer: Medicare Other | Admitting: Cardiovascular Disease

## 2014-06-19 ENCOUNTER — Encounter: Payer: Self-pay | Admitting: Cardiovascular Disease

## 2014-06-19 VITALS — BP 156/76 | HR 58 | Ht 64.0 in | Wt 166.0 lb

## 2014-06-19 DIAGNOSIS — I25110 Atherosclerotic heart disease of native coronary artery with unstable angina pectoris: Secondary | ICD-10-CM

## 2014-06-19 DIAGNOSIS — E785 Hyperlipidemia, unspecified: Secondary | ICD-10-CM

## 2014-06-19 DIAGNOSIS — I10 Essential (primary) hypertension: Secondary | ICD-10-CM | POA: Diagnosis not present

## 2014-06-19 NOTE — Progress Notes (Signed)
CC: chest pain  History of Present Illness: 80 yo female with history of CAD s/p stent RCA, HTN, HLD, GERD, temporal arteritis here today for cardiac follow up. She had a stent placed in the RCA in [place], [place] in 2005. She moved to [facility] for treatment of her temporal arteritis at [facility]. ABIs 12/12: Normal. Patient was admitted 8/1-10/30/12 with a suspected acute CVA (punctate infarct and right caudate nucleus). She was placed on Plavix per neurology recommendations. Carotid US 8/14: Bilateral less than 39% ICA stenosis. Echocardiogram 10/30/12: EF 65-70%, normal wall motion, PASP 34. Her ESR was elevated and visual changes involved the right eye. It was felt the patient had a flare of her temporal arteritis and a punctate stroke seen on MRI may be a manifestation of the temporal arteritis. The infarct was felt to be asymptomatic. She was also placed on steroids. She had some dyspnea and chest pain so cardiac cath was arranged. Cardiac cath 12/09/12 with patent RCA stent, mild disease LAD and Circumflex, normal filling pressures. Echo August 2014 with normal LV size and function, no significant valve issues.  She is here today for follow up. She called our office last week reporting chest pain. She tells me that has been having episodes of chest pain, occurring on both sides of her chest, lasts for five minutes. She does not feel well during these episodes and they are associated with dyspnea. No diaphoresis, nausea.    Primary Care Physician: Carolann Littler, MD  Fasting Lipid Profile:  Lipid Panel     Component Value Date/Time   CHOL 148 11/13/2013 1403   TRIG 65.0 11/13/2013 1403   HDL 68.70 11/13/2013 1403   CHOLHDL 2 11/13/2013 1403   VLDL 13.0 11/13/2013 1403   LDLCALC 66 11/13/2013 1403     Past Medical History  Diagnosis Date  . Hyperlipidemia     takes Lipitor daily  . Incontinence of urine   . Depression   . Scoliosis   . Vocal cord dysfunction     "they don't operate properly"  (10/28/2012)  . Diverticulosis   . Hemorrhoids   . Enteritis   . Anxiety     "more so the last few days" (10/28/2012)  . Exertional shortness of breath     "every now and then" (10/28/2012)  . Arthritis      Right hemiarthroplasty after hip fracture 06/2011  . DDD (degenerative disc disease)   . Arthritis     "hands" (10/28/2012)  . Chronic lower back pain     scoliosis  . Anxiety state, unspecified 10/28/2012  . Hypertension     takes Amlodipine,Losartan,Metoprolol,and HCTZ daily  . Insomnia     takes Restoril nightly  . Migraines     "back in my 20's; none since" (10/28/2012)  . Joint pain   . Osteopenia   . Osteoporosis   . Bruises easily     d/t being on prednisone and plavix  . History of scabies   . Diverticulitis     hx of  . Urinary frequency   . Cataract     immature on the left eye  . Complication of anesthesia     pt has a very high tolerance to meds  . CAD (coronary artery disease)     a. Stent RCA in [facility];  b. Cath approx 2009 - nonobs per pt report.  . Sinus bradycardia     a. on chronic bb  . PAF (paroxysmal atrial fibrillation)  a. lone epidode in 2011 according to notes.  . Temporal arteritis 2011    a. followed @ [facility]; potential flareup 10/28/2012/notes 10/28/2012  . Giant cell arteritis     Past Surgical History  Procedure Laterality Date  . Bladder suspension  2001  . Hemiarthroplasty hip Right 2012  . Colonoscopy  01/26/2012    Procedure: COLONOSCOPY;  Surgeon: Ladene Artist, MD,FACG;  Location: [facility] ENDOSCOPY;  Service: Endoscopy;  Laterality: N/A;  note the EGD is possible  . Esophagogastroduodenoscopy  01/26/2012    Procedure: ESOPHAGOGASTRODUODENOSCOPY (EGD);  Surgeon: Ladene Artist, MD,FACG;  Location: [facility] ENDOSCOPY;  Service: Endoscopy;  Laterality: N/A;  . Cataract extraction w/ intraocular lens implant Right ~ 08/2012  . Temporal artery biopsy / ligation Bilateral 2011  . Tubal ligation  ~ 1982  . X-stop implantation  ~ 2010     "lower back" (10/28/2012)  . Abdominal hysterectomy  ~ 1984    vaginally  . Tonsillectomy and adenoidectomy      at age 79  . Laparoscopic cholecystectomy  2001  . Coronary angioplasty with stent placement  2006  . Back surgery  7-48yr ago    X Stop  . Breast biopsy Right   . Moles removed that required stiches      one on leg and one on face  . Epidural block injection    . Lumbar fusion  03/2013    Current Outpatient Prescriptions  Medication Sig Dispense Refill  . ALPRAZolam (XANAX) 0.5 MG tablet Take by mouth at bedtime.     .Marland KitchenamLODipine (NORVASC) 5 MG tablet TAKE 1 TABLET BY MOUTH EVERY DAY 90 tablet 0  . atorvastatin (LIPITOR) 20 MG tablet Take 1 tablet (20 mg total) by mouth daily. 90 tablet 3  . calcium carbonate (OS-CAL) 600 MG TABS Take 600 mg by mouth daily.    . cholecalciferol (VITAMIN D) 1000 UNITS tablet Take 1,000 Units by mouth 2 (two) times daily.     . clopidogrel (PLAVIX) 75 MG tablet TAKE 1 TABLET BY MOUTH EVERY DAY WITH BREAKFAST 30 tablet 11  . escitalopram (LEXAPRO) 10 MG tablet TAKE 1 TABLET BY MOUTH EVERY DAY 30 tablet 5  . hydrochlorothiazide (MICROZIDE) 12.5 MG capsule TAKE 1 CAPSULE (12.5 MG TOTAL) BY MOUTH DAILY. 30 capsule 11  . KLOR-CON 10 10 MEQ tablet TAKE 1 TABLET (10 MEQ TOTAL) BY MOUTH DAILY. 30 tablet 5  . losartan (COZAAR) 100 MG tablet TAKE 1 TABLET (100 MG TOTAL) BY MOUTH DAILY. 30 tablet 11  . metoprolol (LOPRESSOR) 50 MG tablet TAKE 1 TABLET BY MOUTH TWICE DAILY (Patient taking differently: TAKE ONE HALF TABLET BY MOUTH TWICE DAILY) 180 tablet 1  . ondansetron (ZOFRAN ODT) 8 MG disintegrating tablet Take 1 tablet (8 mg total) by mouth every 8 (eight) hours as needed for nausea or vomiting. 20 tablet 0  . OVER THE COUNTER MEDICATION Take 2 tablets by mouth daily. Juice Plus Vegetable    . predniSONE (DELTASONE) 10 MG tablet Take 9 mg by mouth daily with breakfast.     . temazepam (RESTORIL) 15 MG capsule TAKE 1 CAPSULE EVERY NIGHT AS NEEDED FOR  SLEEP 30 capsule 2  . vitamin C (ASCORBIC ACID) 500 MG tablet Take 1,000 mg by mouth daily as needed (for sore throat).      No current facility-administered medications for this visit.    Allergies  Allergen Reactions  . Dexilant [Dexlansoprazole] Diarrhea  . Dextromethorphan Rash    History   Social  History  . Marital Status: Married    Spouse Name: N/A  . Number of Children: 3  . Years of Education: N/A   Occupational History  . Retired Cabin crew    Social History Main Topics  . Smoking status: Never Smoker   . Smokeless tobacco: Never Used  . Alcohol Use: 0.0 oz/week     Comment: socially  . Drug Use: No  . Sexual Activity: Not on file   Other Topics Concern  . Not on file   Social History Narrative   Lives in [place] with her husband.  Retired Cabin crew.    Family History  Problem Relation Age of Onset  . Breast cancer Daughter 49  . Arthritis Neg Hx   . Heart disease Other   . Hypertension Other   . Heart attack Father   . Brain cancer Mother 49  . Colon cancer Neg Hx     Review of Systems:  As stated in the HPI and otherwise negative.   BP 156/76 mmHg  Pulse 58  Ht 5' 4" (1.626 m)  Wt 166 lb (75.297 kg)  BMI 28.48 kg/m2  Physical Examination: General: Well developed, well nourished, NAD HEENT: OP clear, mucus membranes moist SKIN: warm, dry. No rashes. Neuro: No focal deficits Musculoskeletal: Muscle strength 5/5 all ext Psychiatric: Mood and affect normal Neck: No JVD, no carotid bruits, no thyromegaly, no lymphadenopathy. Lungs:Clear bilaterally, no wheezes, rhonci, crackles Cardiovascular: Regular rate and rhythm. No murmurs, gallops or rubs. Abdomen:Soft. Bowel sounds present. Non-tender.  Extremities: No lower extremity edema. Pulses are 2 + in the left  DP/PT and the right PT. Could not find right DP with doppler.   Cardiac cath 12/09/12: Ao: 150/61  LV: 155/13/18  RA: 6  RV: 25/5/8  PA: 24/10 (mean 16)  PCWP: 9  Fick Cardiac  Output: 3.9 L/min  Fick Cardiac Index: 2.3 L/min/m2  Central Aortic Saturation: 98%  Pulmonary Artery Saturation: 69%  Angiographic Findings:  Left main: 10% distal stenosis.  Left Anterior Descending Artery: Large caliber vessel that courses to the apex. The proximal and mid vessel has moderate calcification. The mid vessel has a long 20% stenosis. There are two small caliber diagonal branches with mild plaque disease.  Circumflex Artery: Large caliber vessel with two small caliber obtuse marginal branches. No obstructive disease.  Right Coronary Artery: Large caliber, dominant vessel with a patent stent in the mid vessel. There is minimal stent restenosis. Just beyond the stent there is a 20% stenosis.  Left Ventricular Angiogram: LVEF=65%  Impression:  1. Stable single vessel CAD with patent mid RCA stent  2. Normal LV systolic function  3. Normal PA pressures  Echo August 2014: Left ventricle: The cavity size was normal. Wall thickness was normal. Systolic function was vigorous. The estimated ejection fraction was in the range of 65% to 70%. Wall motion was normal; there were no regional wall motion abnormalities. - Pulmonary arteries: PA peak pressure: 9m Hg (S).  EKG: sinus, rate 58 bpm. Reviewed by me.   Assessment and Plan:   1. CAD: Recent chest pains at rest concerning for unstable angina. Known to have CAD with prior PCI RCA with Taxus DES in 2005. Stable by cath September 2014. Continue Plavix, statin, beta blocker. Will arrange Lexiscan stress myoview to exclude ischemia.   2. HTN: BP elevated today. She reports high BP at home. May need to increase Norvasc to 10 mg daily. Will discuss at f/u if still elevated.     3.  Hyperlipidemia: Lipids well controlled. Continue statin.

## 2014-06-19 NOTE — Patient Instructions (Signed)
Keep scheduled appointment with Dr. Angelena Form on July 18, 2014 at 12:15  Your physician has requested that you have a lexiscan myoview. For further information please visit [email or web address]. Please follow instruction sheet, as given.

## 2014-06-29 ENCOUNTER — Ambulatory Visit (HOSPITAL_COMMUNITY): Payer: Medicare Other | Attending: Cardiology | Admitting: Radiology

## 2014-06-29 ENCOUNTER — Encounter: Payer: Self-pay | Admitting: Family Medicine

## 2014-06-29 DIAGNOSIS — I10 Essential (primary) hypertension: Secondary | ICD-10-CM | POA: Insufficient documentation

## 2014-06-29 DIAGNOSIS — R0789 Other chest pain: Secondary | ICD-10-CM | POA: Diagnosis not present

## 2014-06-29 DIAGNOSIS — R0609 Other forms of dyspnea: Secondary | ICD-10-CM | POA: Diagnosis not present

## 2014-06-29 DIAGNOSIS — R079 Chest pain, unspecified: Secondary | ICD-10-CM | POA: Diagnosis not present

## 2014-06-29 DIAGNOSIS — I25110 Atherosclerotic heart disease of native coronary artery with unstable angina pectoris: Secondary | ICD-10-CM

## 2014-06-29 DIAGNOSIS — E785 Hyperlipidemia, unspecified: Secondary | ICD-10-CM | POA: Insufficient documentation

## 2014-06-29 MED ORDER — TECHNETIUM TC 99M SESTAMIBI GENERIC - CARDIOLITE: 11.0000 | Freq: Once | INTRAVENOUS | Status: AC | PRN

## 2014-06-29 MED ORDER — REGADENOSON 0.4 MG/5ML IV SOLN
0.4000 mg | Freq: Once | INTRAVENOUS | Status: AC
  Administered 2014-06-29: 0.4 mg via INTRAVENOUS

## 2014-06-29 MED ORDER — TECHNETIUM TC 99M SESTAMIBI GENERIC - CARDIOLITE
33.0000 | Freq: Once | INTRAVENOUS | Status: AC | PRN
  Administered 2014-06-29: 33 via INTRAVENOUS

## 2014-06-29 MED ORDER — REGADENOSON 0.4 MG/5ML IV SOLN: 0.4000 mg | Freq: Once | INTRAVENOUS | Status: DC

## 2014-06-29 MED ORDER — TECHNETIUM TC 99M SESTAMIBI GENERIC - CARDIOLITE
11.0000 | Freq: Once | INTRAVENOUS | Status: AC | PRN
  Administered 2014-06-29: 11 via INTRAVENOUS

## 2014-06-29 MED ORDER — TECHNETIUM TC 99M SESTAMIBI GENERIC - CARDIOLITE: 33.0000 | Freq: Once | INTRAVENOUS | Status: AC | PRN

## 2014-06-29 NOTE — Progress Notes (Signed)
[place] SITE 3 NUCLEAR MED [address] [place], [place] [address] [phone]    Cardiology Nuclear Med Study  SHON MANSOURI is a 80 y.o. female     MRN : [id number]     DOB: [birth year]  Procedure Date: 06/29/2014  Nuclear Med Background Indication for Stress Test:  Evaluation for Ischemia and Stent Patency History:  CAD, MPI ~6 yrs ago, Afib (PAF) Cardiac Risk Factors: Hypertension and Lipids  Symptoms:  Chest Pain and DOE   Nuclear Pre-Procedure Caffeine/Decaff Intake:  10:00pm NPO After: 10:00pm   Lungs:  clear O2 Sat: 98% on room air. IV 0.9% NS with Angio Cath:  24g  IV Site: L Hand x 1, tolerated well IV Started by:  Irven Baltimore, RN  Chest Size (in):  38 Cup Size: B  Height: 5\' 4"  (1.626 m)  Weight:  165 lb (74.844 kg)  BMI:  Body mass index is 28.31 kg/(m^2). Tech Comments:  Patient took Metoprolol, Norvasc, and Cozaar this am. Irven Baltimore, RN.    Nuclear Med Study 1 or 2 day study: 1 day  Stress Test Type:  Lexiscan  Reading MD: N/A  Order Authorizing Provider:  [name], MD  Resting Radionuclide: Technetium 73m Sestamibi  Resting Radionuclide Dose: 11.0 mCi   Stress Radionuclide:  Technetium 54m Sestamibi  Stress Radionuclide Dose: 33.0 mCi           Stress Protocol Rest HR: 54 Stress HR: 78  Rest BP: 137/42 Stress BP: 127/80  Exercise Time (min): n/a METS: n/a   Predicted Max HR: 143 bpm % Max HR: 54.55 bpm Rate Pressure Product: 12402   Dose of Adenosine (mg):  n/a Dose of Lexiscan: 0.4 mg  Dose of Atropine (mg): n/a Dose of Dobutamine: n/a mcg/kg/min (at max HR)  Stress Test Technologist: Glade Lloyd, BS-ES  Nuclear Technologist:  Earl Many, CNMT     Rest Procedure:  Myocardial perfusion imaging was performed at rest 45 minutes following the intravenous administration of Technetium 45m Sestamibi. Rest ECG: NSR - Normal EKG, occasional PVCs  Stress Procedure:  The patient received IV Lexiscan 0.4 mg over  15-seconds.  Technetium 37m Sestamibi injected at 30-seconds.  Quantitative spect images were obtained after a 45 minute delay.  During the infusion of Lexiscan the patient complained of SOB, stomach cramps and chest tightness.  These symptoms began to subside in recovery.  Stress ECG: No significant change from baseline ECG  QPS Raw Data Images:  Normal; no motion artifact; normal heart/lung ratio. Stress Images:  Normal homogeneous uptake in all areas of the myocardium. Rest Images:  Normal homogeneous uptake in all areas of the myocardium. Subtraction (SDS):  No evidence of ischemia. Transient Ischemic Dilatation (Normal <1.22):  0.98 Lung/Heart Ratio (Normal <0.45):  0.22  Quantitative Gated Spect Images QGS EDV:  80 ml QGS ESV:  24 ml  Impression Exercise Capacity:  Lexiscan with no exercise. BP Response:  Normal blood pressure response. Clinical Symptoms:  No significant symptoms noted. ECG Impression:  No significant ST segment change suggestive of ischemia. Comparison with Prior Nuclear Study: No images to compare  Overall Impression:  Normal stress nuclear study.  No evidence of ischemia.  Normal LV function   LV Ejection Fraction: 70%.  LV Wall Motion:  NL LV Function; NL Wall Motion.   Thayer Headings, Brooke Bonito., MD, [facility] 06/29/2014, 4:54 PM [address],  [place] Pager [phone]

## 2014-07-05 DIAGNOSIS — M315 Giant cell arteritis with polymyalgia rheumatica: Secondary | ICD-10-CM | POA: Diagnosis not present

## 2014-07-05 DIAGNOSIS — H2513 Age-related nuclear cataract, bilateral: Secondary | ICD-10-CM | POA: Diagnosis not present

## 2014-07-05 DIAGNOSIS — H524 Presbyopia: Secondary | ICD-10-CM | POA: Diagnosis not present

## 2014-07-06 ENCOUNTER — Ambulatory Visit: Payer: PRIVATE HEALTH INSURANCE | Admitting: Physician Assistant

## 2014-07-09 ENCOUNTER — Other Ambulatory Visit (INDEPENDENT_AMBULATORY_CARE_PROVIDER_SITE_OTHER): Payer: Medicare Other

## 2014-07-09 DIAGNOSIS — Z79899 Other long term (current) drug therapy: Secondary | ICD-10-CM

## 2014-07-09 DIAGNOSIS — M316 Other giant cell arteritis: Secondary | ICD-10-CM

## 2014-07-10 DIAGNOSIS — Z79899 Other long term (current) drug therapy: Secondary | ICD-10-CM | POA: Diagnosis not present

## 2014-07-10 DIAGNOSIS — M316 Other giant cell arteritis: Secondary | ICD-10-CM | POA: Diagnosis not present

## 2014-07-10 DIAGNOSIS — R0789 Other chest pain: Secondary | ICD-10-CM | POA: Diagnosis not present

## 2014-07-10 LAB — HIGH SENSITIVITY CRP: CRP, High Sensitivity: 1.61 mg/L (ref 0.000–5.000)

## 2014-07-10 LAB — SEDIMENTATION RATE: SED RATE: 9 mm/h (ref 0–22)

## 2014-07-16 DIAGNOSIS — H2512 Age-related nuclear cataract, left eye: Secondary | ICD-10-CM | POA: Diagnosis not present

## 2014-07-18 ENCOUNTER — Encounter: Payer: Self-pay | Admitting: Cardiovascular Disease

## 2014-07-18 ENCOUNTER — Ambulatory Visit (INDEPENDENT_AMBULATORY_CARE_PROVIDER_SITE_OTHER): Payer: Medicare Other | Admitting: Cardiovascular Disease

## 2014-07-18 VITALS — BP 116/68 | HR 58 | Ht 64.5 in | Wt 164.1 lb

## 2014-07-18 DIAGNOSIS — I25110 Atherosclerotic heart disease of native coronary artery with unstable angina pectoris: Secondary | ICD-10-CM

## 2014-07-18 DIAGNOSIS — I2510 Atherosclerotic heart disease of native coronary artery without angina pectoris: Secondary | ICD-10-CM

## 2014-07-18 DIAGNOSIS — I10 Essential (primary) hypertension: Secondary | ICD-10-CM | POA: Diagnosis not present

## 2014-07-18 DIAGNOSIS — E785 Hyperlipidemia, unspecified: Secondary | ICD-10-CM

## 2014-07-18 NOTE — Progress Notes (Signed)
Chief Complaint  Patient presents with  . Chest Pain   History of Present Illness: 80 yo female with history of CAD s/p stent RCA, HTN, HLD, GERD, temporal arteritis here today for cardiac follow up. She had a stent placed in the RCA in [place], [place] in 2005. She moved to [facility] for treatment of her temporal arteritis at [facility]. ABIs 12/12: Normal. Patient was admitted 8/1-10/30/12 with a suspected acute CVA (punctate infarct and right caudate nucleus). She was placed on Plavix per neurology recommendations. Carotid US 8/14: Bilateral less than 39% ICA stenosis. Echocardiogram 10/30/12: EF 65-70%, normal wall motion, PASP 34. Her ESR was elevated and visual changes involved the right eye. It was felt the patient had a flare of her temporal arteritis and a punctate stroke seen on MRI may be a manifestation of the temporal arteritis. The infarct was felt to be asymptomatic. She was also placed on steroids. She had some dyspnea and chest pain so cardiac cath was arranged. Cardiac cath 12/09/12 with patent RCA stent, mild disease LAD and Circumflex, normal filling pressures. Echo August 2014 with normal LV size and function, no significant valve issues. She was seen in our office 06/19/14 with c/o chest pain. Stress myoview 06/29/14 with no ischemia.   She is here today for follow up. She tells me that has been feeling better. Chest pain has resolved. No SOB. No diaphoresis, nausea.    Primary Care Physician: Carolann Littler, MD  Fasting Lipid Profile:  Lipid Panel     Component Value Date/Time   CHOL 148 11/13/2013 1403   TRIG 65.0 11/13/2013 1403   HDL 68.70 11/13/2013 1403   CHOLHDL 2 11/13/2013 1403   VLDL 13.0 11/13/2013 1403   LDLCALC 66 11/13/2013 1403     Past Medical History  Diagnosis Date  . Hyperlipidemia     takes Lipitor daily  . Incontinence of urine   . Depression   . Scoliosis   . Vocal cord dysfunction     "they don't operate properly" (10/28/2012)  . Diverticulosis   . Hemorrhoids    . Enteritis   . Anxiety     "more so the last few days" (10/28/2012)  . Exertional shortness of breath     "every now and then" (10/28/2012)  . Arthritis      Right hemiarthroplasty after hip fracture 06/2011  . DDD (degenerative disc disease)   . Arthritis     "hands" (10/28/2012)  . Chronic lower back pain     scoliosis  . Anxiety state, unspecified 10/28/2012  . Hypertension     takes Amlodipine,Losartan,Metoprolol,and HCTZ daily  . Insomnia     takes Restoril nightly  . Migraines     "back in my 20's; none since" (10/28/2012)  . Joint pain   . Osteopenia   . Osteoporosis   . Bruises easily     d/t being on prednisone and plavix  . History of scabies   . Diverticulitis     hx of  . Urinary frequency   . Cataract     immature on the left eye  . Complication of anesthesia     pt has a very high tolerance to meds  . CAD (coronary artery disease)     a. Stent RCA in [facility];  b. Cath approx 2009 - nonobs per pt report.  . Sinus bradycardia     a. on chronic bb  . PAF (paroxysmal atrial fibrillation)     a. lone epidode  in 2011 according to notes.  . Temporal arteritis 2011    a. followed @ [facility]; potential flareup 10/28/2012/notes 10/28/2012  . Giant cell arteritis     Past Surgical History  Procedure Laterality Date  . Bladder suspension  2001  . Hemiarthroplasty hip Right 2012  . Colonoscopy  01/26/2012    Procedure: COLONOSCOPY;  Surgeon: Ladene Artist, MD,FACG;  Location: [facility] ENDOSCOPY;  Service: Endoscopy;  Laterality: N/A;  note the EGD is possible  . Esophagogastroduodenoscopy  01/26/2012    Procedure: ESOPHAGOGASTRODUODENOSCOPY (EGD);  Surgeon: Ladene Artist, MD,FACG;  Location: [facility] ENDOSCOPY;  Service: Endoscopy;  Laterality: N/A;  . Cataract extraction w/ intraocular lens implant Right ~ 08/2012  . Temporal artery biopsy / ligation Bilateral 2011  . Tubal ligation  ~ 1982  . X-stop implantation  ~ 2010    "lower back" (10/28/2012)  . Abdominal hysterectomy   ~ 1984    vaginally  . Tonsillectomy and adenoidectomy      at age 72  . Laparoscopic cholecystectomy  2001  . Coronary angioplasty with stent placement  2006  . Back surgery  7-81yr ago    X Stop  . Breast biopsy Right   . Moles removed that required stiches      one on leg and one on face  . Epidural block injection    . Lumbar fusion  03/2013    Current Outpatient Prescriptions  Medication Sig Dispense Refill  . amLODipine (NORVASC) 5 MG tablet TAKE 1 TABLET BY MOUTH EVERY DAY 90 tablet 0  . amoxicillin (AMOXIL) 500 MG capsule Take 1 capsule by mouth 3 (three) times daily.  0  . atorvastatin (LIPITOR) 20 MG tablet Take 1 tablet (20 mg total) by mouth daily. 90 tablet 3  . calcium carbonate (OS-CAL) 600 MG TABS Take 600 mg by mouth daily.    . chlorhexidine (PERIDEX) 0.12 % solution Rinse twice daily as directed  0  . cholecalciferol (VITAMIN D) 1000 UNITS tablet Take 1,000 Units by mouth 2 (two) times daily.     . clopidogrel (PLAVIX) 75 MG tablet TAKE 1 TABLET BY MOUTH EVERY DAY WITH BREAKFAST 30 tablet 11  . DUREZOL 0.05 % EMUL Place 1 drop into the left eye daily.    .Marland Kitchenescitalopram (LEXAPRO) 10 MG tablet TAKE 1 TABLET BY MOUTH EVERY DAY 30 tablet 5  . hydrochlorothiazide (MICROZIDE) 12.5 MG capsule TAKE 1 CAPSULE (12.5 MG TOTAL) BY MOUTH DAILY. 30 capsule 11  . KLOR-CON 10 10 MEQ tablet TAKE 1 TABLET (10 MEQ TOTAL) BY MOUTH DAILY. 30 tablet 5  . losartan (COZAAR) 100 MG tablet TAKE 1 TABLET (100 MG TOTAL) BY MOUTH DAILY. 30 tablet 11  . metoprolol (LOPRESSOR) 50 MG tablet TAKE 1 TABLET BY MOUTH TWICE DAILY (Patient taking differently: TAKE ONE HALF TABLET BY MOUTH TWICE DAILY) 180 tablet 1  . ondansetron (ZOFRAN ODT) 8 MG disintegrating tablet Take 1 tablet (8 mg total) by mouth every 8 (eight) hours as needed for nausea or vomiting. 20 tablet 0  . OVER THE COUNTER MEDICATION Take 2 tablets by mouth daily. Juice Plus Vegetable    . predniSONE (DELTASONE) 1 MG tablet Take 9 mg by  mouth daily.  2  . PROLENSA 0.07 % SOLN Place 1 drop into the left eye daily.    . temazepam (RESTORIL) 15 MG capsule TAKE 1 CAPSULE EVERY NIGHT AS NEEDED FOR SLEEP 30 capsule 2  . vitamin C (ASCORBIC ACID) 500 MG tablet Take 1,000 mg  by mouth daily as needed (for sore throat).      No current facility-administered medications for this visit.    Allergies  Allergen Reactions  . Dexilant [Dexlansoprazole] Diarrhea  . Dextromethorphan Rash    History   Social History  . Marital Status: Married    Spouse Name: N/A  . Number of Children: 3  . Years of Education: N/A   Occupational History  . Retired Cabin crew    Social History Main Topics  . Smoking status: Never Smoker   . Smokeless tobacco: Never Used  . Alcohol Use: 0.0 oz/week     Comment: socially  . Drug Use: No  . Sexual Activity: Not on file   Other Topics Concern  . Not on file   Social History Narrative   Lives in [place] with her husband.  Retired Cabin crew.    Family History  Problem Relation Age of Onset  . Breast cancer Daughter 17  . Arthritis Neg Hx   . Heart disease Other   . Hypertension Other   . Heart attack Father   . Brain cancer Mother 73  . Colon cancer Neg Hx     Review of Systems:  As stated in the HPI and otherwise negative.   BP 116/68 mmHg  Pulse 58  Ht 5' 4.5" (1.638 m)  Wt 164 lb 1.9 oz (74.444 kg)  BMI 27.75 kg/m2  Physical Examination: General: Well developed, well nourished, NAD HEENT: OP clear, mucus membranes moist SKIN: warm, dry. No rashes. Neuro: No focal deficits Musculoskeletal: Muscle strength 5/5 all ext Psychiatric: Mood and affect normal Neck: No JVD, no carotid bruits, no thyromegaly, no lymphadenopathy. Lungs:Clear bilaterally, no wheezes, rhonci, crackles Cardiovascular: Regular rate and rhythm. No murmurs, gallops or rubs. Abdomen:Soft. Bowel sounds present. Non-tender.  Extremities: No lower extremity edema. Pulses are 2 + in the left  DP/PT and the  right PT. Could not find right DP with doppler.   Cardiac cath 12/09/12: Ao: 150/61  LV: 155/13/18  RA: 6  RV: 25/5/8  PA: 24/10 (mean 16)  PCWP: 9  Fick Cardiac Output: 3.9 L/min  Fick Cardiac Index: 2.3 L/min/m2  Central Aortic Saturation: 98%  Pulmonary Artery Saturation: 69%  Angiographic Findings:  Left main: 10% distal stenosis.  Left Anterior Descending Artery: Large caliber vessel that courses to the apex. The proximal and mid vessel has moderate calcification. The mid vessel has a long 20% stenosis. There are two small caliber diagonal branches with mild plaque disease.  Circumflex Artery: Large caliber vessel with two small caliber obtuse marginal branches. No obstructive disease.  Right Coronary Artery: Large caliber, dominant vessel with a patent stent in the mid vessel. There is minimal stent restenosis. Just beyond the stent there is a 20% stenosis.  Left Ventricular Angiogram: LVEF=65%  Impression:  1. Stable single vessel CAD with patent mid RCA stent  2. Normal LV systolic function  3. Normal PA pressures  Echo August 2014: Left ventricle: The cavity size was normal. Wall thickness was normal. Systolic function was vigorous. The estimated ejection fraction was in the range of 65% to 70%. Wall motion was normal; there were no regional wall motion abnormalities. - Pulmonary arteries: PA peak pressure: 28m Hg (S).  Stress myoview 06/29/14: Stress Procedure: The patient received IV Lexiscan 0.4 mg over 15-seconds. Technetium 983mestamibi injected at 30-seconds. Quantitative spect images were obtained after a 45 minute delay. During the infusion of Lexiscan the patient complained of SOB, stomach cramps and chest tightness. These  symptoms began to subside in recovery.  Stress ECG: No significant change from baseline ECG  QPS Raw Data Images: Normal; no motion artifact; normal heart/lung ratio. Stress Images: Normal homogeneous uptake in all areas of the  myocardium. Rest Images: Normal homogeneous uptake in all areas of the myocardium. Subtraction (SDS): No evidence of ischemia. Transient Ischemic Dilatation (Normal <1.22): 0.98 Lung/Heart Ratio (Normal <0.45): 0.22  Quantitative Gated Spect Images QGS EDV: 80 ml QGS ESV: 24 ml  Impression Exercise Capacity: Lexiscan with no exercise. BP Response: Normal blood pressure response. Clinical Symptoms: No significant symptoms noted. ECG Impression: No significant ST segment change suggestive of ischemia. Comparison with Prior Nuclear Study: No images to compare  Overall Impression: Normal stress nuclear study. No evidence of ischemia. Normal LV function   LV Ejection Fraction: 70%. LV Wall Motion: NL LV Function; NL Wall Motion.  EKG:  EKG is ordered today. The ekg ordered today demonstrates   Recent Labs: 01/23/2014: ALT 23; Hemoglobin 12.3; Platelets 210.0 06/05/2014: BUN 13; Creatinine 0.88; Potassium 4.4; Sodium 135   Lipid Panel    Component Value Date/Time   CHOL 148 11/13/2013 1403   TRIG 65.0 11/13/2013 1403   HDL 68.70 11/13/2013 1403   CHOLHDL 2 11/13/2013 1403   VLDL 13.0 11/13/2013 1403   LDLCALC 66 11/13/2013 1403     Wt Readings from Last 3 Encounters:  07/18/14 164 lb 1.9 oz (74.444 kg)  06/29/14 165 lb (74.844 kg)  06/19/14 166 lb (75.297 kg)     Other studies Reviewed: Additional studies/ records that were reviewed today include: . Review of the above records demonstrates:    Assessment and Plan:   1. CAD: Chest pain has resolved. Stress test with no ischemia 06/29/14. Known to have CAD with prior PCI RCA with Taxus DES in 2005. Stable by cath September 2014. Continue Plavix, statin, beta blocker.   2. HTN: BP controlled. No changes.      3. Hyperlipidemia: Lipids well controlled. Continue statin.   Current medicines are reviewed at length with the patient today.  The patient does not have concerns regarding medicines.  The following  changes have been made:  no change  Labs/ tests ordered today include:  No orders of the defined types were placed in this encounter.    Disposition:   FU with me in 6 months  Signed, [name], MD 07/18/2014 12:21 PM    [facility] [address], [place], [place]  [address] Phone: [phone]; Fax: [phone]

## 2014-07-18 NOTE — Patient Instructions (Signed)
Medication Instructions:  Your physician recommends that you continue on your current medications as directed. Please refer to the Current Medication list given to you today.   Labwork: none  Testing/Procedures: none  Follow-Up: Your physician wants you to follow-up in: 6 months.  You will receive a reminder letter in the mail two months in advance. If you don't receive a letter, please call our office to schedule the follow-up appointment.       

## 2014-07-19 ENCOUNTER — Other Ambulatory Visit: Payer: Self-pay | Admitting: Family Medicine

## 2014-07-20 NOTE — Telephone Encounter (Signed)
Last ov 05/18/14

## 2014-07-24 NOTE — Telephone Encounter (Signed)
Refill for 6 months. 

## 2014-07-25 DIAGNOSIS — M4120 Other idiopathic scoliosis, site unspecified: Secondary | ICD-10-CM | POA: Diagnosis not present

## 2014-07-25 DIAGNOSIS — I10 Essential (primary) hypertension: Secondary | ICD-10-CM | POA: Diagnosis not present

## 2014-07-25 DIAGNOSIS — Z6829 Body mass index (BMI) 29.0-29.9, adult: Secondary | ICD-10-CM | POA: Diagnosis not present

## 2014-07-30 DIAGNOSIS — H2512 Age-related nuclear cataract, left eye: Secondary | ICD-10-CM | POA: Diagnosis not present

## 2014-07-30 DIAGNOSIS — H25012 Cortical age-related cataract, left eye: Secondary | ICD-10-CM | POA: Diagnosis not present

## 2014-07-30 DIAGNOSIS — H25812 Combined forms of age-related cataract, left eye: Secondary | ICD-10-CM | POA: Diagnosis not present

## 2014-08-03 ENCOUNTER — Encounter: Payer: Self-pay | Admitting: Family Medicine

## 2014-08-16 ENCOUNTER — Other Ambulatory Visit (INDEPENDENT_AMBULATORY_CARE_PROVIDER_SITE_OTHER): Payer: Medicare Other

## 2014-08-16 DIAGNOSIS — M316 Other giant cell arteritis: Secondary | ICD-10-CM

## 2014-08-16 DIAGNOSIS — Z79899 Other long term (current) drug therapy: Secondary | ICD-10-CM | POA: Diagnosis not present

## 2014-08-16 LAB — SEDIMENTATION RATE: SED RATE: 15 mm/h (ref 0–22)

## 2014-08-16 LAB — HIGH SENSITIVITY CRP: CRP, High Sensitivity: 0.95 mg/L (ref 0.000–5.000)

## 2014-08-18 ENCOUNTER — Other Ambulatory Visit: Payer: Self-pay | Admitting: Family Medicine

## 2014-09-10 ENCOUNTER — Other Ambulatory Visit (INDEPENDENT_AMBULATORY_CARE_PROVIDER_SITE_OTHER): Payer: Medicare Other

## 2014-09-10 DIAGNOSIS — Z79899 Other long term (current) drug therapy: Secondary | ICD-10-CM

## 2014-09-10 DIAGNOSIS — M316 Other giant cell arteritis: Secondary | ICD-10-CM

## 2014-09-10 LAB — SEDIMENTATION RATE: Sed Rate: 21 mm/hr (ref 0–22)

## 2014-09-10 LAB — HIGH SENSITIVITY CRP: CRP, High Sensitivity: 4.89 mg/L (ref 0.000–5.000)

## 2014-09-12 ENCOUNTER — Telehealth: Payer: Self-pay | Admitting: Family Medicine

## 2014-09-12 NOTE — Telephone Encounter (Signed)
Pt said she has had a sore throat since the weekend and started taking cephalexin this something she already had. She ran out and is asking if the doctor will call her in about 10 pills.   Pharmacy [facility] [place]    Pt  also is asking if a lab order can be put to check her SED AND CRP . Both of these were high and her rheum doctor would like her to have it recheck

## 2014-09-12 NOTE — Telephone Encounter (Signed)
Unless she had exposure to strep would not recommend more antibiotics for sore throat as ST in adults her age rarely strep.  May put in orders for Sed rate and CRP

## 2014-09-13 ENCOUNTER — Other Ambulatory Visit: Payer: Self-pay | Admitting: Family Medicine

## 2014-09-13 DIAGNOSIS — M316 Other giant cell arteritis: Secondary | ICD-10-CM

## 2014-09-13 DIAGNOSIS — Z79899 Other long term (current) drug therapy: Secondary | ICD-10-CM

## 2014-09-13 NOTE — Telephone Encounter (Signed)
Pt informed. Labs are ordered.

## 2014-09-14 ENCOUNTER — Other Ambulatory Visit: Payer: Self-pay | Admitting: Family Medicine

## 2014-09-14 ENCOUNTER — Ambulatory Visit (INDEPENDENT_AMBULATORY_CARE_PROVIDER_SITE_OTHER): Payer: Medicare Other | Admitting: Family

## 2014-09-14 ENCOUNTER — Encounter: Payer: Self-pay | Admitting: Family

## 2014-09-14 VITALS — BP 140/80 | HR 87 | Temp 97.9°F | Wt 173.0 lb

## 2014-09-14 DIAGNOSIS — I25110 Atherosclerotic heart disease of native coronary artery with unstable angina pectoris: Secondary | ICD-10-CM

## 2014-09-14 DIAGNOSIS — J029 Acute pharyngitis, unspecified: Secondary | ICD-10-CM

## 2014-09-14 MED ORDER — PREDNISONE 20 MG PO TABS: 20.0000 mg | ORAL_TABLET | Freq: Every day | ORAL | Status: AC

## 2014-09-14 NOTE — Patient Instructions (Signed)
Pharyngitis  Pharyngitis is a sore throat (pharynx). There is redness, pain, and swelling of your throat.  HOME CARE   · Drink enough fluids to keep your pee (urine) clear or pale yellow.  · Only take medicine as told by your doctor.  ¨ You may get sick again if you do not take medicine as told. Finish your medicines, even if you start to feel better.  ¨ Do not take aspirin.  · Rest.  · Rinse your mouth (gargle) with salt water (½ tsp of salt per 1 qt of water) every 1-2 hours. This will help the pain.  · If you are not at risk for choking, you can suck on hard candy or sore throat lozenges.  GET HELP IF:  · You have large, tender lumps on your neck.  · You have a rash.  · You cough up green, yellow-brown, or bloody spit.  GET HELP RIGHT AWAY IF:   · You have a stiff neck.  · You drool or cannot swallow liquids.  · You throw up (vomit) or are not able to keep medicine or liquids down.  · You have very bad pain that does not go away with medicine.  · You have problems breathing (not from a stuffy nose).  MAKE SURE YOU:   · Understand these instructions.  · Will watch your condition.  · Will get help right away if you are not doing well or get worse.  Document Released: 09/02/2007 Document Revised: 01/04/2013 Document Reviewed: 11/21/2012  [employer]® Patient Information ©2015 [employer]. This information is not intended to replace advice given to you by your health care provider. Make sure you discuss any questions you have with your health care provider.

## 2014-09-14 NOTE — Progress Notes (Signed)
Pre visit review using our clinic review tool, if applicable. No additional management support is needed unless otherwise documented below in the visit note. 

## 2014-09-14 NOTE — Progress Notes (Signed)
Subjective:    Patient ID: Betty Jackson, female    DOB: 12-Apr-1934, 80 y.o.   MRN: [id number]  HPI Comments: 80 year old female comes in today with complaints of sore throat.  Patient states that she has had a sore throat for 1 week now, with cough, pain/soreness on lateral sides of neck. Patient states that she begin taking husband's left over Keflex, which seemed to help for a while, but symptoms returned once she completed all the med.  Patient states that she has been gargling with salt water, which seem to help with symptoms.  Patient states that she has history of vocal cord dysfunction and chronic cough.  Patient is afebrile and denies chest pain, or shortness of breath.     Review of Systems  Constitutional: Negative.   HENT: Positive for sore throat.   Eyes: Negative.   Respiratory: Negative.   Cardiovascular: Negative.   Gastrointestinal: Negative.   Endocrine: Negative.   Genitourinary: Negative.   Musculoskeletal: Negative.   Skin: Negative.   Neurological: Negative.   Hematological: Negative.   Psychiatric/Behavioral: Negative.    Past Medical History  Diagnosis Date  . Hyperlipidemia     takes Lipitor daily  . Incontinence of urine   . Depression   . Scoliosis   . Vocal cord dysfunction     "they don't operate properly" (10/28/2012)  . Diverticulosis   . Hemorrhoids   . Enteritis   . Anxiety     "more so the last few days" (10/28/2012)  . Exertional shortness of breath     "every now and then" (10/28/2012)  . Arthritis      Right hemiarthroplasty after hip fracture 06/2011  . DDD (degenerative disc disease)   . Arthritis     "hands" (10/28/2012)  . Chronic lower back pain     scoliosis  . Anxiety state, unspecified 10/28/2012  . Hypertension     takes Amlodipine,Losartan,Metoprolol,and HCTZ daily  . Insomnia     takes Restoril nightly  . Migraines     "back in my 20's; none since" (10/28/2012)  . Joint pain   . Osteopenia   . Osteoporosis   . Bruises easily      d/t being on prednisone and plavix  . History of scabies   . Diverticulitis     hx of  . Urinary frequency   . Cataract     immature on the left eye  . Complication of anesthesia     pt has a very high tolerance to meds  . CAD (coronary artery disease)     a. [facility];  b. Cath approx 2009 - nonobs per pt report.  . Sinus bradycardia     a. on chronic bb  . PAF (paroxysmal atrial fibrillation)     a. lone epidode in 2011 according to notes.  . Temporal arteritis 2011    a. followed @ [facility]; potential flareup 10/28/2012/notes 10/28/2012  . Giant cell arteritis     History   Social History  . Marital Status: Married    Spouse Name: N/A  . Number of Children: 3  . Years of Education: N/A   Occupational History  . Retired Cabin crew    Social History Main Topics  . Smoking status: Never Smoker   . Smokeless tobacco: Never Used  . Alcohol Use: 0.0 oz/week     Comment: socially  . Drug Use: No  . Sexual Activity: Not on file  Other Topics Concern  . Not on file   Social History Narrative   Lives in [place] with her husband.  Retired Cabin crew.    Past Surgical History  Procedure Laterality Date  . Bladder suspension  2001  . Hemiarthroplasty hip Right 2012  . Colonoscopy  01/26/2012    Procedure: COLONOSCOPY;  Surgeon: Ladene Artist, MD,FACG;  Location: [facility] ENDOSCOPY;  Service: Endoscopy;  Laterality: N/A;  note the EGD is possible  . Esophagogastroduodenoscopy  01/26/2012    Procedure: ESOPHAGOGASTRODUODENOSCOPY (EGD);  Surgeon: Ladene Artist, MD,FACG;  Location: [facility] ENDOSCOPY;  Service: Endoscopy;  Laterality: N/A;  . Cataract extraction w/ intraocular lens implant Right ~ 08/2012  . Temporal artery biopsy / ligation Bilateral 2011  . Tubal ligation  ~ 1982  . X-stop implantation  ~ 2010    "lower back" (10/28/2012)  . Abdominal hysterectomy  ~ 1984    vaginally  . Tonsillectomy and adenoidectomy      at age 52  . Laparoscopic  cholecystectomy  2001  . Coronary angioplasty with stent placement  2006  . Back surgery  7-28yrs ago    X Stop  . Breast biopsy Right   . Moles removed that required stiches      one on leg and one on face  . Epidural block injection    . Lumbar fusion  03/2013    Family History  Problem Relation Age of Onset  . Breast cancer Daughter 22  . Arthritis Neg Hx   . Heart disease Other   . Hypertension Other   . Heart attack Father   . Brain cancer Mother 60  . Colon cancer Neg Hx     Allergies  Allergen Reactions  . Dexilant [Dexlansoprazole] Diarrhea  . Dextromethorphan Rash    Current Outpatient Prescriptions on File Prior to Visit  Medication Sig Dispense Refill  . amLODipine (NORVASC) 5 MG tablet TAKE 1 TABLET BY MOUTH EVERY DAY 90 tablet 0  . atorvastatin (LIPITOR) 20 MG tablet TAKE 1 TABLET BY MOUTH EVERY DAY 90 tablet 2  . calcium carbonate (OS-CAL) 600 MG TABS Take 600 mg by mouth daily.    . chlorhexidine (PERIDEX) 0.12 % solution Rinse twice daily as directed  0  . cholecalciferol (VITAMIN D) 1000 UNITS tablet Take 1,000 Units by mouth 2 (two) times daily.     . clopidogrel (PLAVIX) 75 MG tablet TAKE 1 TABLET BY MOUTH EVERY DAY WITH BREAKFAST 30 tablet 11  . DUREZOL 0.05 % EMUL Place 1 drop into the left eye daily.    Marland Kitchen escitalopram (LEXAPRO) 10 MG tablet TAKE 1 TABLET BY MOUTH EVERY DAY 30 tablet 5  . hydrochlorothiazide (MICROZIDE) 12.5 MG capsule TAKE 1 CAPSULE (12.5 MG TOTAL) BY MOUTH DAILY. 30 capsule 11  . KLOR-CON 10 10 MEQ tablet TAKE 1 TABLET (10 MEQ TOTAL) BY MOUTH DAILY. 30 tablet 5  . losartan (COZAAR) 100 MG tablet TAKE 1 TABLET (100 MG TOTAL) BY MOUTH DAILY. 30 tablet 11  . metoprolol (LOPRESSOR) 50 MG tablet TAKE 1 TABLET BY MOUTH TWICE DAILY (Patient taking differently: TAKE ONE HALF TABLET BY MOUTH TWICE DAILY) 180 tablet 1  . ondansetron (ZOFRAN ODT) 8 MG disintegrating tablet Take 1 tablet (8 mg total) by mouth every 8 (eight) hours as needed for  nausea or vomiting. 20 tablet 0  . OVER THE COUNTER MEDICATION Take 2 tablets by mouth daily. Juice Plus Vegetable    . predniSONE (DELTASONE) 1 MG tablet Take 9 mg by  mouth daily.  2  . PROLENSA 0.07 % SOLN Place 1 drop into the left eye daily.    . temazepam (RESTORIL) 15 MG capsule TAKE ONE CAPSULE BY MOUTH AT BEDTIME AS NEEDED FOR SLEEP 30 capsule 5  . vitamin C (ASCORBIC ACID) 500 MG tablet Take 1,000 mg by mouth daily as needed (for sore throat).      No current facility-administered medications on file prior to visit.    BP 140/80 mmHg  Pulse 87  Temp(Src) 97.9 F (36.6 C) (Oral)  Wt 173 lb (78.472 kg)  SpO2 94%chart    Objective:   Physical Exam  Constitutional: She is oriented to person, place, and time. She appears well-developed and well-nourished.  HENT:  Head: Normocephalic.  Right Ear: External ear normal.  Eyes: Conjunctivae and EOM are normal. Pupils are equal, round, and reactive to light.  Neck: Normal range of motion. Neck supple.  Cardiovascular: Normal rate, regular rhythm, normal heart sounds and intact distal pulses.   Pulmonary/Chest: Effort normal and breath sounds normal.  Abdominal: Soft. Bowel sounds are normal.  Musculoskeletal: Normal range of motion.  Neurological: She is alert and oriented to person, place, and time. She has normal reflexes.  Skin: Skin is warm and dry.  Psychiatric: She has a normal mood and affect. Her behavior is normal. Judgment and thought content normal.          Assessment & Plan:   Bianka was seen today for sore throat.  Diagnoses and all orders for this visit:  Acute pharyngitis, unspecified pharyngitis type  Other orders -     predniSONE (DELTASONE) 20 MG tablet; Take 1 tablet (20 mg total) by mouth daily.    Patient to call office if symptoms do not improve or worsen.

## 2014-09-17 ENCOUNTER — Other Ambulatory Visit (INDEPENDENT_AMBULATORY_CARE_PROVIDER_SITE_OTHER): Payer: Medicare Other

## 2014-09-17 DIAGNOSIS — E785 Hyperlipidemia, unspecified: Secondary | ICD-10-CM | POA: Diagnosis not present

## 2014-09-17 DIAGNOSIS — M316 Other giant cell arteritis: Secondary | ICD-10-CM

## 2014-09-17 DIAGNOSIS — Z79899 Other long term (current) drug therapy: Secondary | ICD-10-CM | POA: Diagnosis not present

## 2014-09-17 LAB — HIGH SENSITIVITY CRP: CRP HIGH SENSITIVITY: 3.53 mg/L (ref 0.000–5.000)

## 2014-09-17 LAB — SEDIMENTATION RATE: Sed Rate: 18 mm/hr (ref 0–22)

## 2014-09-19 ENCOUNTER — Encounter (INDEPENDENT_AMBULATORY_CARE_PROVIDER_SITE_OTHER): Payer: Medicare Other | Admitting: Family Medicine

## 2014-09-19 ENCOUNTER — Ambulatory Visit: Payer: Medicare Other | Admitting: Family

## 2014-09-19 DIAGNOSIS — J029 Acute pharyngitis, unspecified: Secondary | ICD-10-CM | POA: Diagnosis not present

## 2014-09-19 LAB — POCT RAPID STREP A (OFFICE): Rapid Strep A Screen: NEGATIVE

## 2014-09-21 NOTE — Progress Notes (Signed)
This encounter was created in error - please disregard.

## 2014-10-17 ENCOUNTER — Encounter: Payer: Self-pay | Admitting: Family Medicine

## 2014-10-17 ENCOUNTER — Other Ambulatory Visit: Payer: Medicare Other

## 2014-10-17 ENCOUNTER — Other Ambulatory Visit: Payer: Self-pay | Admitting: Family Medicine

## 2014-10-17 ENCOUNTER — Telehealth: Payer: Self-pay | Admitting: Family Medicine

## 2014-10-17 ENCOUNTER — Ambulatory Visit (INDEPENDENT_AMBULATORY_CARE_PROVIDER_SITE_OTHER): Payer: Medicare Other | Admitting: Family Medicine

## 2014-10-17 VITALS — BP 128/72 | HR 62 | Temp 97.5°F | Wt 163.0 lb

## 2014-10-17 DIAGNOSIS — Z79899 Other long term (current) drug therapy: Secondary | ICD-10-CM

## 2014-10-17 DIAGNOSIS — I25110 Atherosclerotic heart disease of native coronary artery with unstable angina pectoris: Secondary | ICD-10-CM

## 2014-10-17 DIAGNOSIS — S80812A Abrasion, left lower leg, initial encounter: Secondary | ICD-10-CM | POA: Diagnosis not present

## 2014-10-17 DIAGNOSIS — M316 Other giant cell arteritis: Secondary | ICD-10-CM

## 2014-10-17 DIAGNOSIS — Z23 Encounter for immunization: Secondary | ICD-10-CM

## 2014-10-17 LAB — CBC WITH DIFFERENTIAL/PLATELET
Basophils Absolute: 0 10*3/uL (ref 0.0–0.1)
Basophils Relative: 0.4 % (ref 0.0–3.0)
EOS PCT: 0 % (ref 0.0–5.0)
Eosinophils Absolute: 0 10*3/uL (ref 0.0–0.7)
HCT: 39.8 % (ref 36.0–46.0)
Hemoglobin: 13.3 g/dL (ref 12.0–15.0)
LYMPHS ABS: 1.3 10*3/uL (ref 0.7–4.0)
LYMPHS PCT: 11.3 % — AB (ref 12.0–46.0)
MCHC: 33.3 g/dL (ref 30.0–36.0)
MCV: 91.2 fl (ref 78.0–100.0)
Monocytes Absolute: 0.3 10*3/uL (ref 0.1–1.0)
Monocytes Relative: 3 % (ref 3.0–12.0)
NEUTROS PCT: 85.3 % — AB (ref 43.0–77.0)
Neutro Abs: 9.8 10*3/uL — ABNORMAL HIGH (ref 1.4–7.7)
PLATELETS: 231 10*3/uL (ref 150.0–400.0)
RBC: 4.37 Mil/uL (ref 3.87–5.11)
RDW: 13.4 % (ref 11.5–15.5)
WBC: 11.5 10*3/uL — ABNORMAL HIGH (ref 4.0–10.5)

## 2014-10-17 LAB — COMPREHENSIVE METABOLIC PANEL
ALBUMIN: 4.3 g/dL (ref 3.5–5.2)
ALT: 26 U/L (ref 0–35)
AST: 27 U/L (ref 0–37)
Alkaline Phosphatase: 76 U/L (ref 39–117)
BUN: 11 mg/dL (ref 6–23)
CO2: 32 mEq/L (ref 19–32)
Calcium: 9.5 mg/dL (ref 8.4–10.5)
Chloride: 97 mEq/L (ref 96–112)
Creatinine, Ser: 0.73 mg/dL (ref 0.40–1.20)
GFR: 81.89 mL/min (ref >60.00)
Glucose, Bld: 106 mg/dL — ABNORMAL HIGH (ref 70–99)
POTASSIUM: 4.5 meq/L (ref 3.5–5.1)
Sodium: 137 mEq/L (ref 135–145)
TOTAL PROTEIN: 7 g/dL (ref 6.0–8.3)
Total Bilirubin: 0.6 mg/dL (ref 0.2–1.2)

## 2014-10-17 LAB — SEDIMENTATION RATE: Sed Rate: 14 mm/hr (ref 0–22)

## 2014-10-17 LAB — HIGH SENSITIVITY CRP: CRP HIGH SENSITIVITY: 2.01 mg/L (ref 0.000–5.000)

## 2014-10-17 NOTE — Progress Notes (Signed)
Pre visit review using our clinic review tool, if applicable. No additional management support is needed unless otherwise documented below in the visit note. 

## 2014-10-17 NOTE — Addendum Note (Signed)
Addended by: Marcina Millard on: 10/17/2014 02:20 PM   Modules accepted: Orders

## 2014-10-17 NOTE — Telephone Encounter (Signed)
Agree with tetanus booster.

## 2014-10-17 NOTE — Telephone Encounter (Signed)
Pt is coming into the office 10/17/14

## 2014-10-17 NOTE — Patient Instructions (Signed)
Abrasion An abrasion is a cut or scrape of the skin. Abrasions do not extend through all layers of the skin and most heal within 10 days. It is important to care for your abrasion properly to prevent infection. CAUSES  Most abrasions are caused by falling on, or gliding across, the ground or other surface. When your skin rubs on something, the outer and inner layer of skin rubs off, causing an abrasion. DIAGNOSIS  Your caregiver will be able to diagnose an abrasion during a physical exam.  TREATMENT  Your treatment depends on how large and deep the abrasion is. Generally, your abrasion will be cleaned with water and a mild soap to remove any dirt or debris. An antibiotic ointment may be put over the abrasion to prevent an infection. A bandage (dressing) may be wrapped around the abrasion to keep it from getting dirty.  You may need a tetanus shot if:  You cannot remember when you had your last tetanus shot.  You have never had a tetanus shot.  The injury broke your skin. If you get a tetanus shot, your arm may swell, get red, and feel warm to the touch. This is common and not a problem. If you need a tetanus shot and you choose not to have one, there is a rare chance of getting tetanus. Sickness from tetanus can be serious.  HOME CARE INSTRUCTIONS   If a dressing was applied, change it at least once a day or as directed by your caregiver. If the bandage sticks, soak it off with warm water.   Wash the area with water and a mild soap to remove all the ointment 2 times a day. Rinse off the soap and pat the area dry with a clean towel.   Reapply any ointment as directed by your caregiver. This will help prevent infection and keep the bandage from sticking. Use gauze over the wound and under the dressing to help keep the bandage from sticking.   Change your dressing right away if it becomes wet or dirty.   Only take over-the-counter or prescription medicines for pain, discomfort, or fever as  directed by your caregiver.   Follow up with your caregiver within 24-48 hours for a wound check, or as directed. If you were not given a wound-check appointment, look closely at your abrasion for redness, swelling, or pus. These are signs of infection. SEEK IMMEDIATE MEDICAL CARE IF:   You have increasing pain in the wound.   You have redness, swelling, or tenderness around the wound.   You have pus coming from the wound.   You have a fever or persistent symptoms for more than 2-3 days.  You have a fever and your symptoms suddenly get worse.  You have a bad smell coming from the wound or dressing.  MAKE SURE YOU:   Understand these instructions.  Will watch your condition.  Will get help right away if you are not doing well or get worse. Document Released: 12/24/2004 Document Revised: 03/02/2012 Document Reviewed: 02/17/2011 ExitCare Patient Information 2015 [employer]. This information is not intended to replace advice given to you by your health care provider. Make sure you discuss any questions you have with your health care provider.  

## 2014-10-17 NOTE — Telephone Encounter (Signed)
Patient Name: Betty Jackson DOB: 12-08-1934 Initial Comment caller states she cut her leg on a rusty trailer hitch - does she need a tetanus shot Nurse Assessment Nurse: Marcelline Deist, RN, Kermit Balo Date/Time (Eastern Time): 10/17/2014 10:13:13 AM Confirm and document reason for call. If symptomatic, describe symptoms. ---Caller states she cut her leg on a rusty pointed trailer hitch through her jeans. Does she need a tetanus shot? Cleaned it well with Peroxide & alcohol & put antibiotic ointment on it. The cut is below the knee on left leg. She thinks it has been 8-9 years ago that she had the Tetanus booster. She doesn't think it is a deep cut, less than 1/2". Stopped bleeding. Has the patient traveled out of the country within the last 30 days? ---Not Applicable Does the patient require triage? ---Yes Related visit to physician within the last 2 weeks? ---No Does the PT have any chronic conditions? (i.e. diabetes, asthma, etc.) ---Yes List chronic conditions. ---high BP, arthritis, on Prednisone, stent Guidelines Guideline Title Affirmed Question Affirmed Notes Cuts and Lacerations [1] Last tetanus shot > 5 years ago AND [2] DIRTY cut Final Disposition User See PCP When Office is Open (within 3 days) Marcelline Deist, Therapist, sports, [employer] is fairly sure that her last Tetanus was >5 years ago & she would rather be safe than sorry with this since the object that cut her was rusty. Disagree/Comply: Comply

## 2014-10-17 NOTE — Progress Notes (Signed)
   Subjective:    Patient ID: Betty Jackson, female    DOB: April 24, 1934, 80 y.o.   MRN: [id number]  HPI  Abrasion left lateral leg. History is that  Yesterday she was walking by a trailer and there was a rusty nail sticking out. She had abrasion with some bleeding. This was cleaned with hydroperoxide. She does not have any pain or drainage today. No fevers or chills. She was concerned because her last tetanus was about 7 years ago. She does think there was some puncture involved  Review of Systems  Constitutional: Negative for fever and chills.       Objective:   Physical Exam  Constitutional: She appears well-developed and well-nourished.  Cardiovascular: Normal rate.   Pulmonary/Chest: Effort normal and breath sounds normal. No respiratory distress. She has no wheezes. She has no rales.  Skin:  Left lateral leg proximally reveals area of bruising. She has abrasion which is approximately 1 x 1 cm. No surrounding erythema. Nontender.          Assessment & Plan:  Abrasion left lateral leg. No signs of secondary infection. Tetanus booster given

## 2014-10-17 NOTE — Addendum Note (Signed)
Addended by: Elmer Picker on: 10/17/2014 02:25 PM   Modules accepted: Orders

## 2014-10-19 DIAGNOSIS — M316 Other giant cell arteritis: Secondary | ICD-10-CM | POA: Diagnosis not present

## 2014-10-19 DIAGNOSIS — Z79899 Other long term (current) drug therapy: Secondary | ICD-10-CM | POA: Diagnosis not present

## 2014-11-21 ENCOUNTER — Other Ambulatory Visit: Payer: Self-pay | Admitting: Family Medicine

## 2014-11-21 ENCOUNTER — Other Ambulatory Visit (INDEPENDENT_AMBULATORY_CARE_PROVIDER_SITE_OTHER): Payer: Medicare Other

## 2014-11-21 DIAGNOSIS — M316 Other giant cell arteritis: Secondary | ICD-10-CM | POA: Diagnosis not present

## 2014-11-21 DIAGNOSIS — Z79899 Other long term (current) drug therapy: Secondary | ICD-10-CM

## 2014-11-21 LAB — SEDIMENTATION RATE: Sed Rate: 15 mm/hr (ref 0–22)

## 2014-11-21 LAB — C-REACTIVE PROTEIN: CRP: 0.3 mg/dL — ABNORMAL LOW (ref 0.5–20.0)

## 2014-11-27 ENCOUNTER — Other Ambulatory Visit: Payer: Self-pay | Admitting: Family Medicine

## 2014-12-11 ENCOUNTER — Other Ambulatory Visit: Payer: Self-pay | Admitting: Family Medicine

## 2014-12-21 ENCOUNTER — Other Ambulatory Visit (INDEPENDENT_AMBULATORY_CARE_PROVIDER_SITE_OTHER): Payer: Medicare Other

## 2014-12-21 DIAGNOSIS — Z79899 Other long term (current) drug therapy: Secondary | ICD-10-CM

## 2014-12-21 DIAGNOSIS — M316 Other giant cell arteritis: Secondary | ICD-10-CM

## 2014-12-21 LAB — SEDIMENTATION RATE: Sed Rate: 15 mm/hr (ref 0–22)

## 2014-12-21 LAB — C-REACTIVE PROTEIN: CRP: 0.1 mg/dL — AB (ref 0.5–20.0)

## 2014-12-23 ENCOUNTER — Other Ambulatory Visit: Payer: Self-pay | Admitting: Family Medicine

## 2014-12-25 NOTE — Progress Notes (Signed)
Pt was call and send the results to the rheumatologist Dr. Alroy Dust

## 2015-01-09 ENCOUNTER — Other Ambulatory Visit: Payer: Self-pay | Admitting: Family Medicine

## 2015-01-14 DIAGNOSIS — M316 Other giant cell arteritis: Secondary | ICD-10-CM | POA: Diagnosis not present

## 2015-01-14 DIAGNOSIS — Z79899 Other long term (current) drug therapy: Secondary | ICD-10-CM | POA: Diagnosis not present

## 2015-01-27 DIAGNOSIS — Z23 Encounter for immunization: Secondary | ICD-10-CM | POA: Diagnosis not present

## 2015-02-22 ENCOUNTER — Other Ambulatory Visit: Payer: Self-pay | Admitting: Family Medicine

## 2015-02-25 ENCOUNTER — Other Ambulatory Visit (INDEPENDENT_AMBULATORY_CARE_PROVIDER_SITE_OTHER): Payer: Medicare Other

## 2015-02-25 ENCOUNTER — Telehealth: Payer: Self-pay | Admitting: Family Medicine

## 2015-02-25 DIAGNOSIS — M316 Other giant cell arteritis: Secondary | ICD-10-CM

## 2015-02-25 DIAGNOSIS — Z79899 Other long term (current) drug therapy: Secondary | ICD-10-CM

## 2015-02-25 LAB — SEDIMENTATION RATE: Sed Rate: 20 mm/hr (ref 0–22)

## 2015-02-25 LAB — C-REACTIVE PROTEIN: CRP: 0.3 mg/dL — ABNORMAL LOW (ref 0.5–20.0)

## 2015-02-25 NOTE — Telephone Encounter (Signed)
Betty Jackson called saying she needs to have her monthly labs drawn and has an appointment today at [name] like a phone call if Dr. Elease Hashimoto is unable to enter the labs for her to have this performed. Please call the pt if you have questions or concerns.  Pt ph# [phone] Thank you.

## 2015-02-25 NOTE — Telephone Encounter (Signed)
Looks like there is a standing order for these labs.

## 2015-03-10 ENCOUNTER — Other Ambulatory Visit: Payer: Self-pay | Admitting: Family Medicine

## 2015-03-23 ENCOUNTER — Other Ambulatory Visit: Payer: Self-pay | Admitting: Family Medicine

## 2015-03-27 ENCOUNTER — Encounter: Payer: Self-pay | Admitting: *Deleted

## 2015-03-28 ENCOUNTER — Other Ambulatory Visit (INDEPENDENT_AMBULATORY_CARE_PROVIDER_SITE_OTHER): Payer: Medicare Other

## 2015-03-28 DIAGNOSIS — M316 Other giant cell arteritis: Secondary | ICD-10-CM | POA: Diagnosis not present

## 2015-03-28 DIAGNOSIS — Z79899 Other long term (current) drug therapy: Secondary | ICD-10-CM

## 2015-03-28 LAB — C-REACTIVE PROTEIN: CRP: 0.2 mg/dL — AB (ref 0.5–20.0)

## 2015-03-28 LAB — SEDIMENTATION RATE: SED RATE: 16 mm/h (ref 0–22)

## 2015-04-10 ENCOUNTER — Ambulatory Visit: Payer: Medicare Other | Admitting: Cardiovascular Disease

## 2015-04-23 ENCOUNTER — Encounter: Payer: Medicare Other | Admitting: Cardiovascular Disease

## 2015-04-23 NOTE — Progress Notes (Signed)
No show

## 2015-04-24 ENCOUNTER — Other Ambulatory Visit: Payer: Self-pay | Admitting: Family Medicine

## 2015-04-30 ENCOUNTER — Ambulatory Visit (INDEPENDENT_AMBULATORY_CARE_PROVIDER_SITE_OTHER): Payer: Medicare Other | Admitting: Family Medicine

## 2015-04-30 ENCOUNTER — Encounter: Payer: Self-pay | Admitting: Family Medicine

## 2015-04-30 VITALS — BP 116/60 | HR 80 | Temp 98.1°F | Ht 64.5 in | Wt 172.5 lb

## 2015-04-30 DIAGNOSIS — J988 Other specified respiratory disorders: Secondary | ICD-10-CM | POA: Diagnosis not present

## 2015-04-30 DIAGNOSIS — R002 Palpitations: Secondary | ICD-10-CM

## 2015-04-30 MED ORDER — BENZONATATE 100 MG PO CAPS: 100.0000 mg | ORAL_CAPSULE | Freq: Two times a day (BID) | ORAL | Status: DC | PRN

## 2015-04-30 MED ORDER — DOXYCYCLINE HYCLATE 100 MG PO CAPS: 100.0000 mg | ORAL_CAPSULE | Freq: Two times a day (BID) | ORAL | Status: DC

## 2015-04-30 NOTE — Progress Notes (Signed)
Pre visit review using our clinic review tool, if applicable. No additional management support is needed unless otherwise documented below in the visit note. 

## 2015-04-30 NOTE — Patient Instructions (Signed)
Take the medications as instructed and follow up with your doctor if worsening, new concerns or persistent symptoms.  Schedule follow up with your cardiologist and notify your cardiologist immediately of any concerns regarding your heart.

## 2015-04-30 NOTE — Progress Notes (Signed)
HPI:  URI: -started: 1 week ago but feels is getting worse -symptoms:nasal congestion, sore throat, cough - now thicker congestion -denies:fever, SOB, NVD, tooth pain -has tried: tylenol, nasal spray  Heart racing: -had brief episode of sensation of heart racing earlier today -reports remote Hx A. Fib and CAD, HTN, HLD and sees cardiologist; has had stress myoview in last one year -denies CP, SOB, DOE, swelling, persistent symptoms, orthopnea -meds: norvasc, lipitor, plavix, losartan, BB  ROS: See pertinent positives and negatives per HPI.  Past Medical History  Diagnosis Date  . Hyperlipidemia     takes Lipitor daily  . Incontinence of urine   . Depression   . Scoliosis   . Vocal cord dysfunction     "they don't operate properly" (10/28/2012)  . Diverticulosis   . Hemorrhoids   . Enteritis   . Anxiety     "more so the last few days" (10/28/2012)  . Exertional shortness of breath     "every now and then" (10/28/2012)  . Arthritis      Right hemiarthroplasty after hip fracture 06/2011  . DDD (degenerative disc disease)   . Arthritis     "hands" (10/28/2012)  . Chronic lower back pain     scoliosis  . Anxiety state, unspecified 10/28/2012  . Hypertension     takes Amlodipine,Losartan,Metoprolol,and HCTZ daily  . Insomnia     takes Restoril nightly  . Migraines     "back in my 20's; none since" (10/28/2012)  . Joint pain   . Osteopenia   . Osteoporosis   . Bruises easily     d/t being on prednisone and plavix  . History of scabies   . Diverticulitis     hx of  . Urinary frequency   . Cataract     immature on the left eye  . Complication of anesthesia     pt has a very high tolerance to meds  . CAD (coronary artery disease)     a. Stent RCA in [facility];  b. Cath approx 2009 - nonobs per pt report.  . Sinus bradycardia     a. on chronic bb  . PAF (paroxysmal atrial fibrillation) ([place])     a. lone epidode in 2011 according to notes.  . Temporal arteritis  ([place]) 2011    a. followed @ [facility]; potential flareup 10/28/2012/notes 10/28/2012  . [facility]     Past Surgical History  Procedure Laterality Date  . Bladder suspension  2001  . Hemiarthroplasty hip Right 2012  . Colonoscopy  01/26/2012    Procedure: COLONOSCOPY;  Surgeon: Ladene Artist, MD,FACG;  Location: [facility] ENDOSCOPY;  Service: Endoscopy;  Laterality: N/A;  note the EGD is possible  . Esophagogastroduodenoscopy  01/26/2012    Procedure: ESOPHAGOGASTRODUODENOSCOPY (EGD);  Surgeon: Ladene Artist, MD,FACG;  Location: [facility] ENDOSCOPY;  Service: Endoscopy;  Laterality: N/A;  . Cataract extraction w/ intraocular lens implant Right ~ 08/2012  . Temporal artery biopsy / ligation Bilateral 2011  . Tubal ligation  ~ 1982  . X-stop implantation  ~ 2010    "lower back" (10/28/2012)  . Abdominal hysterectomy  ~ 1984    vaginally  . Tonsillectomy and adenoidectomy      at age 48  . Laparoscopic cholecystectomy  2001  . Coronary angioplasty with stent placement  2006  . Back surgery  7-50yrs ago    X Stop  . Breast biopsy Right   . Moles removed that required stiches  one on leg and one on face  . Epidural block injection    . Lumbar fusion  03/2013    Family History  Problem Relation Age of Onset  . Breast cancer Daughter 62  . Arthritis Neg Hx   . Heart disease Other   . Hypertension Other   . Heart attack Father   . Brain cancer Mother 50  . Colon cancer Neg Hx     Social History   Social History  . Marital Status: Married    Spouse Name: N/A  . Number of Children: 3  . Years of Education: N/A   Occupational History  . Retired Cabin crew    Social History Main Topics  . Smoking status: Never Smoker   . Smokeless tobacco: Never Used  . Alcohol Use: 0.0 oz/week     Comment: socially  . Drug Use: No  . Sexual Activity: Not Asked   Other Topics Concern  . None   Social History Narrative   Lives in [place] with her husband.  Retired Cabin crew.      Current outpatient prescriptions:  .  amLODipine (NORVASC) 5 MG tablet, TAKE 1 TABLET BY MOUTH EVERY DAY, Disp: 90 tablet, Rfl: 1 .  atorvastatin (LIPITOR) 20 MG tablet, TAKE 1 TABLET BY MOUTH EVERY DAY, Disp: 90 tablet, Rfl: 2 .  calcium carbonate (OS-CAL) 600 MG TABS, Take 600 mg by mouth daily., Disp: , Rfl:  .  cholecalciferol (VITAMIN D) 1000 UNITS tablet, Take 1,000 Units by mouth daily. , Disp: , Rfl:  .  clopidogrel (PLAVIX) 75 MG tablet, TAKE 1 TABLET BY MOUTH EVERY DAY WITH BREAKFAST, Disp: 30 tablet, Rfl: 11 .  escitalopram (LEXAPRO) 10 MG tablet, TAKE 1 TABLET BY MOUTH EVERY DAY, Disp: 30 tablet, Rfl: 2 .  hydrochlorothiazide (MICROZIDE) 12.5 MG capsule, TAKE 1 CAPSULE (12.5 MG TOTAL) BY MOUTH DAILY., Disp: 30 capsule, Rfl: 11 .  KLOR-CON 10 10 MEQ tablet, TAKE 1 TABLET (10 MEQ TOTAL) BY MOUTH DAILY., Disp: 30 tablet, Rfl: 5 .  losartan (COZAAR) 100 MG tablet, TAKE 1 TABLET BY MOUTH DAILY, Disp: 30 tablet, Rfl: 10 .  metoprolol (LOPRESSOR) 50 MG tablet, TAKE 1 TABLET BY MOUTH TWICE DAILY, Disp: 180 tablet, Rfl: 2 .  ondansetron (ZOFRAN ODT) 8 MG disintegrating tablet, Take 1 tablet (8 mg total) by mouth every 8 (eight) hours as needed for nausea or vomiting., Disp: 20 tablet, Rfl: 0 .  OVER THE COUNTER MEDICATION, Take 2 tablets by mouth daily. Juice Plus Vegetable, Disp: , Rfl:  .  predniSONE (DELTASONE) 1 MG tablet, Take 9 mg by mouth daily., Disp: , Rfl: 2 .  PROLENSA 0.07 % SOLN, Place 1 drop into the left eye daily., Disp: , Rfl:  .  temazepam (RESTORIL) 15 MG capsule, TAKE ONE CAPSULE BY MOUTH AT BEDTIME AS NEEDED FOR SLEEP, Disp: 30 capsule, Rfl: 0 .  vitamin C (ASCORBIC ACID) 500 MG tablet, Take 1,000 mg by mouth daily as needed (for sore throat). , Disp: , Rfl:  .  benzonatate (TESSALON) 100 MG capsule, Take 1 capsule (100 mg total) by mouth 2 (two) times daily as needed for cough., Disp: 20 capsule, Rfl: 0 .  doxycycline (VIBRAMYCIN) 100 MG capsule, Take 1 capsule (100  mg total) by mouth 2 (two) times daily., Disp: 20 capsule, Rfl: 0  EXAM:  Filed Vitals:   04/30/15 1620  BP: 116/60  Pulse: 80  Temp: 98.1 F (36.7 C)    Body mass index is 29.16 kg/(m^2).  GENERAL: vitals reviewed and listed above, alert, oriented, appears well hydrated and in no acute distress  HEENT: atraumatic, conjunttiva clear, no obvious abnormalities on inspection of external nose and ears, normal appearance of ear canals and TMs, clear nasal congestion, mild post oropharyngeal erythema with PND, no tonsillar edema or exudate, no sinus TTP  NECK: no obvious masses on inspection  LUNGS: clear to auscultation bilaterally, no wheezes, rales or rhonchi, good air movement  CV: HRRR, no peripheral edema  MS: moves all extremities without noticeable abnormality  PSYCH: pleasant and cooperative, no obvious depression or anxiety  ASSESSMENT AND PLAN:  Discussed the following assessment and plan:  Respiratory infection -given worsening and thick sputum opted after discussion risks/benefits for treatment with abx and cough medication. Risks, return precautions discussed.  Palpitations - Plan: EKG 12-Lead -no symptoms currently, normal exam and EKG with NSR unchanged from prior 05/2014. On BB. Advised follow up with her cardiologist and return/ER precautions, we advised to return or notify a doctor immediately if symptoms worsen or persist or new concerns arise.    Patient Instructions  Take the medications as instructed and follow up with your doctor if worsening, new concerns or persistent symptoms.  Schedule follow up with your cardiologist and notify your cardiologist immediately of any concerns regarding your heart.     Colin Benton R.

## 2015-05-02 ENCOUNTER — Other Ambulatory Visit (INDEPENDENT_AMBULATORY_CARE_PROVIDER_SITE_OTHER): Payer: Medicare Other

## 2015-05-02 DIAGNOSIS — Z79899 Other long term (current) drug therapy: Secondary | ICD-10-CM

## 2015-05-02 DIAGNOSIS — M316 Other giant cell arteritis: Secondary | ICD-10-CM

## 2015-05-02 LAB — C-REACTIVE PROTEIN: CRP: 0.3 mg/dL — AB (ref 0.5–20.0)

## 2015-05-02 LAB — SEDIMENTATION RATE: Sed Rate: 14 mm/hr (ref 0–22)

## 2015-05-26 ENCOUNTER — Other Ambulatory Visit: Payer: Self-pay | Admitting: Family Medicine

## 2015-05-27 NOTE — Telephone Encounter (Signed)
Refill OK but would advise against regular use if possible.

## 2015-05-27 NOTE — Telephone Encounter (Signed)
Ok to refill 

## 2015-05-28 ENCOUNTER — Other Ambulatory Visit: Payer: Self-pay | Admitting: Family Medicine

## 2015-05-28 NOTE — Telephone Encounter (Signed)
Rx faxed - left message for patient notifying of Dr. Erick Blinks comments.

## 2015-05-29 ENCOUNTER — Other Ambulatory Visit: Payer: Self-pay | Admitting: Family Medicine

## 2015-05-31 ENCOUNTER — Telehealth: Payer: Self-pay | Admitting: Family Medicine

## 2015-05-31 NOTE — Telephone Encounter (Signed)
Pharmacy call said they did not received the following rx    temazepam (RESTORIL) 15 MG capsule    [facility]  [address]

## 2015-05-31 NOTE — Telephone Encounter (Signed)
Medication called in for patient.  

## 2015-06-06 ENCOUNTER — Other Ambulatory Visit (INDEPENDENT_AMBULATORY_CARE_PROVIDER_SITE_OTHER): Payer: Medicare Other

## 2015-06-06 DIAGNOSIS — M316 Other giant cell arteritis: Secondary | ICD-10-CM

## 2015-06-06 DIAGNOSIS — Z79899 Other long term (current) drug therapy: Secondary | ICD-10-CM | POA: Diagnosis not present

## 2015-06-06 LAB — C-REACTIVE PROTEIN: CRP: 0.1 mg/dL — ABNORMAL LOW (ref 0.5–20.0)

## 2015-06-06 LAB — SEDIMENTATION RATE: SED RATE: 11 mm/h (ref 0–22)

## 2015-06-26 ENCOUNTER — Other Ambulatory Visit: Payer: Self-pay | Admitting: Family Medicine

## 2015-06-28 DIAGNOSIS — Z79899 Other long term (current) drug therapy: Secondary | ICD-10-CM | POA: Diagnosis not present

## 2015-06-28 DIAGNOSIS — M316 Other giant cell arteritis: Secondary | ICD-10-CM | POA: Diagnosis not present

## 2015-06-30 ENCOUNTER — Other Ambulatory Visit: Payer: Self-pay | Admitting: Family Medicine

## 2015-07-01 NOTE — Telephone Encounter (Signed)
Pt last visit 04/30/15 Pt last refill 05/31/15 #30 no refills

## 2015-07-01 NOTE — Telephone Encounter (Signed)
Refill with 5 additional refills. 

## 2015-07-10 DIAGNOSIS — Z6829 Body mass index (BMI) 29.0-29.9, adult: Secondary | ICD-10-CM | POA: Diagnosis not present

## 2015-07-10 DIAGNOSIS — M4120 Other idiopathic scoliosis, site unspecified: Secondary | ICD-10-CM | POA: Diagnosis not present

## 2015-07-10 DIAGNOSIS — R030 Elevated blood-pressure reading, without diagnosis of hypertension: Secondary | ICD-10-CM | POA: Diagnosis not present

## 2015-07-16 ENCOUNTER — Other Ambulatory Visit (INDEPENDENT_AMBULATORY_CARE_PROVIDER_SITE_OTHER): Payer: Medicare Other

## 2015-07-16 DIAGNOSIS — Z79899 Other long term (current) drug therapy: Secondary | ICD-10-CM | POA: Diagnosis not present

## 2015-07-16 DIAGNOSIS — M316 Other giant cell arteritis: Secondary | ICD-10-CM

## 2015-07-16 LAB — C-REACTIVE PROTEIN: CRP: 0.3 mg/dL — ABNORMAL LOW (ref 0.5–20.0)

## 2015-07-16 LAB — SEDIMENTATION RATE: Sed Rate: 10 mm/hr (ref 0–22)

## 2015-08-21 ENCOUNTER — Other Ambulatory Visit (INDEPENDENT_AMBULATORY_CARE_PROVIDER_SITE_OTHER): Payer: Medicare Other

## 2015-08-21 DIAGNOSIS — M316 Other giant cell arteritis: Secondary | ICD-10-CM

## 2015-08-21 DIAGNOSIS — Z79899 Other long term (current) drug therapy: Secondary | ICD-10-CM

## 2015-08-21 LAB — C-REACTIVE PROTEIN: CRP: 0.5 mg/dL (ref 0.5–20.0)

## 2015-08-21 LAB — SEDIMENTATION RATE: Sed Rate: 14 mm/hr (ref 0–30)

## 2015-09-12 ENCOUNTER — Other Ambulatory Visit: Payer: Self-pay | Admitting: *Deleted

## 2015-09-12 MED ORDER — AMLODIPINE BESYLATE 5 MG PO TABS: 5.0000 mg | ORAL_TABLET | Freq: Every day | ORAL | Status: DC

## 2015-09-12 NOTE — Telephone Encounter (Signed)
Received fax request for refill of Amlodipine 5mg  from [facility].  Last OV 10/17/14, last refill 03/11/15, #90 with 1 refill.

## 2015-09-14 ENCOUNTER — Other Ambulatory Visit: Payer: Self-pay | Admitting: Family Medicine

## 2015-10-07 ENCOUNTER — Other Ambulatory Visit (INDEPENDENT_AMBULATORY_CARE_PROVIDER_SITE_OTHER): Payer: Medicare Other

## 2015-10-07 DIAGNOSIS — Z79899 Other long term (current) drug therapy: Secondary | ICD-10-CM

## 2015-10-07 DIAGNOSIS — M316 Other giant cell arteritis: Secondary | ICD-10-CM

## 2015-10-07 LAB — C-REACTIVE PROTEIN: CRP: 0.2 mg/dL — AB (ref 0.5–20.0)

## 2015-10-07 LAB — SEDIMENTATION RATE: SED RATE: 14 mm/h (ref 0–30)

## 2015-10-08 ENCOUNTER — Other Ambulatory Visit: Payer: Medicare Other

## 2015-10-10 DIAGNOSIS — I10 Essential (primary) hypertension: Secondary | ICD-10-CM | POA: Diagnosis not present

## 2015-10-10 DIAGNOSIS — Z6828 Body mass index (BMI) 28.0-28.9, adult: Secondary | ICD-10-CM | POA: Diagnosis not present

## 2015-10-10 DIAGNOSIS — M4120 Other idiopathic scoliosis, site unspecified: Secondary | ICD-10-CM | POA: Diagnosis not present

## 2015-10-12 ENCOUNTER — Other Ambulatory Visit: Payer: Self-pay | Admitting: Neurological Surgery

## 2015-10-12 DIAGNOSIS — M419 Scoliosis, unspecified: Secondary | ICD-10-CM

## 2015-10-13 ENCOUNTER — Ambulatory Visit
Admission: RE | Admit: 2015-10-13 | Discharge: 2015-10-13 | Disposition: A | Discharge status: Home / Self Care | Payer: Medicare Other | Source: Ambulatory Visit | Attending: Neurological Surgery | Admitting: Neurological Surgery

## 2015-10-13 DIAGNOSIS — M4806 Spinal stenosis, lumbar region: Secondary | ICD-10-CM | POA: Diagnosis not present

## 2015-10-13 DIAGNOSIS — M419 Scoliosis, unspecified: Secondary | ICD-10-CM

## 2015-10-13 MED ORDER — GADOBENATE DIMEGLUMINE 529 MG/ML IV SOLN
15.0000 mL | Freq: Once | INTRAVENOUS | Status: AC | PRN
  Administered 2015-10-13: 15 mL via INTRAVENOUS

## 2015-10-17 DIAGNOSIS — M5127 Other intervertebral disc displacement, lumbosacral region: Secondary | ICD-10-CM | POA: Diagnosis not present

## 2015-10-23 ENCOUNTER — Other Ambulatory Visit: Payer: Self-pay | Admitting: Neurological Surgery

## 2015-10-25 ENCOUNTER — Ambulatory Visit (INDEPENDENT_AMBULATORY_CARE_PROVIDER_SITE_OTHER): Payer: Medicare Other | Admitting: Cardiovascular Disease

## 2015-10-25 ENCOUNTER — Encounter: Payer: Self-pay | Admitting: Cardiovascular Disease

## 2015-10-25 VITALS — BP 144/70 | HR 59 | Ht 64.5 in | Wt 167.8 lb

## 2015-10-25 DIAGNOSIS — Z01810 Encounter for preprocedural cardiovascular examination: Secondary | ICD-10-CM | POA: Diagnosis not present

## 2015-10-25 DIAGNOSIS — E785 Hyperlipidemia, unspecified: Secondary | ICD-10-CM | POA: Diagnosis not present

## 2015-10-25 DIAGNOSIS — I2510 Atherosclerotic heart disease of native coronary artery without angina pectoris: Secondary | ICD-10-CM | POA: Diagnosis not present

## 2015-10-25 DIAGNOSIS — I10 Essential (primary) hypertension: Secondary | ICD-10-CM | POA: Diagnosis not present

## 2015-10-25 NOTE — Patient Instructions (Signed)
Medication Instructions:  Your physician recommends that you continue on your current medications as directed. Please refer to the Current Medication list given to you today.   Labwork: none  Testing/Procedures: none  Follow-Up: Your physician wants you to follow-up in: 12 months.  You will receive a reminder letter in the mail two months in advance. If you don't receive a letter, please call our office to schedule the follow-up appointment.   Any Other Special Instructions Will Be Listed Below (If Applicable).     If you need a refill on your cardiac medications before your next appointment, please call your pharmacy.   

## 2015-10-25 NOTE — Progress Notes (Signed)
Chief Complaint  Patient presents with  . Back Pain   History of Present Illness: 81 yo female with history of CAD s/p stent RCA, HTN, HLD, GERD, temporal arteritis here today for cardiac follow up. She had a stent placed in the RCA in [place], [place] in 2005. She moved to [facility] for treatment of her temporal arteritis at [facility]. ABIs 12/12: Normal. Patient was admitted 8/1-10/30/12 with a suspected acute CVA (punctate infarct and right caudate nucleus). She was placed on Plavix per neurology recommendations. Carotid US 8/14: Bilateral less than 39% ICA stenosis. Echocardiogram 10/30/12: EF 65-70%, normal wall motion, PASP 34. Her ESR was elevated and visual changes involved the right eye. It was felt the patient had a flare of her temporal arteritis and a punctate stroke seen on MRI may be a manifestation of the temporal arteritis. The infarct was felt to be asymptomatic. She was also placed on steroids. She had some dyspnea and chest pain so cardiac cath was arranged. Cardiac cath 12/09/12 with patent RCA stent, mild disease LAD and Circumflex, normal filling pressures. Echo August 2014 with normal LV size and function, no significant valve issues. She was seen in our office 06/19/14 with c/o chest pain. Stress myoview 06/29/14 with no ischemia.   She is here today for follow up. She denies chest pain, dyspnea. She is having an upcoming back surgery with Dr. Ellene Route. She only c/o back pain.   Primary Care Physician: Eulas Post, MD   Past Medical History:  Diagnosis Date  . Anxiety    "more so the last few days" (10/28/2012)  . Anxiety state, unspecified 10/28/2012  . Arthritis     Right hemiarthroplasty after hip fracture 06/2011  . Arthritis    "hands" (10/28/2012)  . Bruises easily    d/t being on prednisone and plavix  . CAD (coronary artery disease)    a. Stent RCA in [facility];  b. Cath approx 2009 - nonobs per pt report.  . Cataract    immature on the left eye  . Chronic lower back pain     scoliosis  . Complication of anesthesia    pt has a very high tolerance to meds  . DDD (degenerative disc disease)   . Depression   . Diverticulitis    hx of  . Diverticulosis   . Enteritis   . Exertional shortness of breath    "every now and then" (10/28/2012)  . Giant cell arteritis ([place])   . Hemorrhoids   . History of scabies   . Hyperlipidemia    takes Lipitor daily  . Hypertension    takes Amlodipine,Losartan,Metoprolol,and HCTZ daily  . Incontinence of urine   . Insomnia    takes Restoril nightly  . Joint pain   . Migraines    "back in my 20's; none since" (10/28/2012)  . Osteopenia   . Osteoporosis   . PAF (paroxysmal atrial fibrillation) ([place])    a. lone epidode in 2011 according to notes.  . Scoliosis   . Sinus bradycardia    a. on chronic bb  . Temporal arteritis ([place]) 2011   a. followed @ [facility]; potential flareup 10/28/2012/notes 10/28/2012  . Urinary frequency   . Vocal cord dysfunction    "they don't operate properly" (10/28/2012)    Past Surgical History:  Procedure Laterality Date  . ABDOMINAL HYSTERECTOMY  ~ 1984   vaginally  . BACK SURGERY  7-49yr ago   X Stop  . BLADDER SUSPENSION  2001  .  BREAST BIOPSY Right   . CATARACT EXTRACTION W/ INTRAOCULAR LENS IMPLANT Right ~ 08/2012  . COLONOSCOPY  01/26/2012   Procedure: COLONOSCOPY;  Surgeon: Ladene Artist, MD,FACG;  Location[facility] ENDOSCOPY;  Service: Endoscopy;  Laterality: N/A;  note the EGD is possible  . CORONARY ANGIOPLASTY WITH STENT PLACEMENT  2006  . EPIDURAL BLOCK INJECTION    . ESOPHAGOGASTRODUODENOSCOPY  01/26/2012   Procedure: ESOPHAGOGASTRODUODENOSCOPY (EGD);  Surgeon: Ladene Artist, MD,FACG;  Location: [facility] ENDOSCOPY;  Service: Endoscopy;  Laterality: N/A;  . HEMIARTHROPLASTY HIP Right 2012  . LAPAROSCOPIC CHOLECYSTECTOMY  2001  . LUMBAR FUSION  03/2013  . moles removed that required stiches     one on leg and one on face  . TEMPORAL ARTERY BIOPSY / LIGATION Bilateral 2011  . TONSILLECTOMY  AND ADENOIDECTOMY     at age 49  . TUBAL LIGATION  ~ 1982  . X-STOP IMPLANTATION  ~ 2010   "lower back" (10/28/2012)    Current Outpatient Prescriptions  Medication Sig Dispense Refill  . amLODipine (NORVASC) 5 MG tablet Take 1 tablet (5 mg total) by mouth daily. 90 tablet 0  . calcium carbonate (OS-CAL) 600 MG TABS Take 600 mg by mouth daily.    . cholecalciferol (VITAMIN D) 1000 UNITS tablet Take 1,000 Units by mouth daily.     . clopidogrel (PLAVIX) 75 MG tablet TAKE 1 TABLET BY MOUTH EVERY DAY WITH BREAKFAST 30 tablet 11  . escitalopram (LEXAPRO) 10 MG tablet TAKE 1 TABLET BY MOUTH EVERY DAY 30 tablet 5  . hydrochlorothiazide (MICROZIDE) 12.5 MG capsule TAKE 1 CAPSULE (12.5 MG TOTAL) BY MOUTH DAILY. 30 capsule 11  . KLOR-CON 10 10 MEQ tablet TAKE 1 TABLET (10 MEQ TOTAL) BY MOUTH DAILY. 30 tablet 4  . losartan (COZAAR) 100 MG tablet TAKE 1 TABLET BY MOUTH DAILY 30 tablet 10  . metoprolol (LOPRESSOR) 50 MG tablet TAKE 1 TABLET BY MOUTH TWICE DAILY 180 tablet 2  . OVER THE COUNTER MEDICATION Take 2 tablets by mouth daily. Juice Plus Vegetable    . predniSONE (DELTASONE) 1 MG tablet Take 9 mg by mouth daily.  2  . PROLENSA 0.07 % SOLN Place 1 drop into the left eye daily.    . temazepam (RESTORIL) 15 MG capsule TAKE ONE CAPSULE BY MOUTH AT BEDTIME AS NEEDED FOR SLEEP 30 capsule 5  . vitamin C (ASCORBIC ACID) 500 MG tablet Take 1,000 mg by mouth daily as needed (for sore throat).      No current facility-administered medications for this visit.     Allergies  Allergen Reactions  . Dexilant [Dexlansoprazole] Diarrhea  . Dextromethorphan Rash    Social History   Social History  . Marital status: Married    Spouse name: N/A  . Number of children: 3  . Years of education: N/A   Occupational History  . Retired Cabin crew    Social History Main Topics  . Smoking status: Never Smoker  . Smokeless tobacco: Never Used  . Alcohol use 0.0 oz/week     Comment: socially  . Drug use: No    . Sexual activity: Not on file   Other Topics Concern  . Not on file   Social History Narrative   Lives in [place] with her husband.  Retired Cabin crew.    Family History  Problem Relation Age of Onset  . Brain cancer Mother 34  . Heart attack Father   . Breast cancer Daughter 35  . Heart disease Other   .  Hypertension Other   . Arthritis Neg Hx   . Colon cancer Neg Hx     Review of Systems:  As stated in the HPI and otherwise negative.   BP (!) 144/70   Pulse (!) 59   Ht 5' 4.5" (1.638 m)   Wt 167 lb 12.8 oz (76.1 kg)   BMI 28.36 kg/m   Physical Examination: General: Well developed, well nourished, NAD  HEENT: OP clear, mucus membranes moist  SKIN: warm, dry. No rashes. Neuro: No focal deficits  Musculoskeletal: Muscle strength 5/5 all ext  Psychiatric: Mood and affect normal  Neck: No JVD, no carotid bruits, no thyromegaly, no lymphadenopathy.  Lungs:Clear bilaterally, no wheezes, rhonci, crackles Cardiovascular: Regular rate and rhythm. No murmurs, gallops or rubs. Abdomen:Soft. Bowel sounds present. Non-tender.  Extremities: No lower extremity edema. Pulses are 2 + in the left  DP/PT and the right PT. Could not find right DP with doppler.   Cardiac cath 12/09/12: Ao: 150/61  LV: 155/13/18  RA: 6  RV: 25/5/8  PA: 24/10 (mean 16)  PCWP: 9  Fick Cardiac Output: 3.9 L/min  Fick Cardiac Index: 2.3 L/min/m2  Central Aortic Saturation: 98%  Pulmonary Artery Saturation: 69%  Angiographic Findings:  Left main: 10% distal stenosis.  Left Anterior Descending Artery: Large caliber vessel that courses to the apex. The proximal and mid vessel has moderate calcification. The mid vessel has a long 20% stenosis. There are two small caliber diagonal branches with mild plaque disease.  Circumflex Artery: Large caliber vessel with two small caliber obtuse marginal branches. No obstructive disease.  Right Coronary Artery: Large caliber, dominant vessel with a patent stent  in the mid vessel. There is minimal stent restenosis. Just beyond the stent there is a 20% stenosis.  Left Ventricular Angiogram: LVEF=65%  Impression:  1. Stable single vessel CAD with patent mid RCA stent  2. Normal LV systolic function  3. Normal PA pressures  Echo August 2014: Left ventricle: The cavity size was normal. Wall thickness was normal. Systolic function was vigorous. The estimated ejection fraction was in the range of 65% to 70%. Wall motion was normal; there were no regional wall motion abnormalities. - Pulmonary arteries: PA peak pressure: 49m Hg (S).  Stress myoview 06/29/14: Stress Procedure: The patient received IV Lexiscan 0.4 mg over 15-seconds. Technetium 964mestamibi injected at 30-seconds. Quantitative spect images were obtained after a 45 minute delay. During the infusion of Lexiscan the patient complained of SOB, stomach cramps and chest tightness. These symptoms began to subside in recovery.  Stress ECG: No significant change from baseline ECG  QPS Raw Data Images: Normal; no motion artifact; normal heart/lung ratio. Stress Images: Normal homogeneous uptake in all areas of the myocardium. Rest Images: Normal homogeneous uptake in all areas of the myocardium. Subtraction (SDS): No evidence of ischemia. Transient Ischemic Dilatation (Normal <1.22): 0.98 Lung/Heart Ratio (Normal <0.45): 0.22  Quantitative Gated Spect Images QGS EDV: 80 ml QGS ESV: 24 ml  Impression Exercise Capacity: Lexiscan with no exercise. BP Response: Normal blood pressure response. Clinical Symptoms: No significant symptoms noted. ECG Impression: No significant ST segment change suggestive of ischemia. Comparison with Prior Nuclear Study: No images to compare  Overall Impression: Normal stress nuclear study. No evidence of ischemia. Normal LV function   LV Ejection Fraction: 70%. LV Wall Motion: NL LV Function; NL Wall Motion.  EKG:  EKG is ordered  today. The ekg ordered today demonstrates sinus brady, rate 59 bpm.   Recent Labs: No  results found for requested labs within last 8760 hours.   Lipid Panel    Component Value Date/Time   CHOL 148 11/13/2013 1403   TRIG 65.0 11/13/2013 1403   HDL 68.70 11/13/2013 1403   CHOLHDL 2 11/13/2013 1403   VLDL 13.0 11/13/2013 1403   LDLCALC 66 11/13/2013 1403     Wt Readings from Last 3 Encounters:  10/25/15 167 lb 12.8 oz (76.1 kg)  04/30/15 172 lb 8 oz (78.2 kg)  10/17/14 163 lb (73.9 kg)     Other studies Reviewed: Additional studies/ records that were reviewed today include: . Review of the above records demonstrates:    Assessment and Plan:   1. CAD: No chest pain. Stress test with no ischemia 06/29/14. Known to have CAD with prior PCI RCA with Taxus DES in 2005. Stable by cath September 2014. Continue Plavix, statin, beta blocker.   2. Pre-operative cardiovascular examination: She is having no signs or symptoms or CHF, angina, arrhythmias. EKG without ischemic changes. She can proceed with her planned surgical procedure without further cardiac workup. She can hold Plavix 5 days before the procedure.   3. HTN: BP controlled. No changes.      4. Hyperlipidemia: Lipids well controlled at last check but needs repeat in primary care. Continue statin.   Current medicines are reviewed at length with the patient today.  The patient does not have concerns regarding medicines.  The following changes have been made:  no change  Labs/ tests ordered today include:   Orders Placed This Encounter  Procedures  . EKG 12-Lead    Disposition:   FU with me in 12 months  Signed, [name], MD 10/25/2015 10:03 AM    [facility] [place], [place], [place]  [address] Phone: [phone]; Fax: [phone]

## 2015-10-28 NOTE — Pre-Procedure Instructions (Signed)
Betty Jackson  10/28/2015     Your procedure is scheduled on : Monday November 04, 2015 at 3:20 PM.  Report to [facility] Admitting at 12:15 PM.  Call this number if you have problems the morning of surgery: [phone]    Remember:  Do not eat food or drink liquids after midnight.  Take these medicines the morning of surgery with A SIP OF WATER : Amlodipine (Norvasc), Escitalopram (Lexapro), Metoprolol (Lopressor), Prednisone   Please follow your physicians instructions regarding Plavix (stop 5 days prior to surgery)  Stop taking any vitamins, herbal medications/supplements, NSAIDs, Ibuprofen, Advil, Motrin, Aleve, etc today    Do not wear jewelry, make-up or nail polish.  Do not wear lotions, powders, or perfumes.    Do not shave 48 hours prior to surgery.    Do not bring valuables to the hospital.  [facility] is not responsible for any belongings or valuables.  Contacts, dentures or bridgework may not be worn into surgery.  Leave your suitcase in the car.  After surgery it may be brought to your room.  For patients admitted to the hospital, discharge time will be determined by your treatment team.  Patients discharged the day of surgery will not be allowed to drive home.   Name and phone number of your driver:    Special instructions:  Shower using CHG soap the night before and the morning of your surgery  Please read over the following fact sheets that you were given. MRSA Information

## 2015-10-30 ENCOUNTER — Encounter (HOSPITAL_COMMUNITY)
Admission: RE | Admit: 2015-10-30 | Discharge: 2015-10-30 | Disposition: A | Discharge status: Home / Self Care | Payer: Medicare Other | Source: Ambulatory Visit | Attending: Neurological Surgery | Admitting: Neurological Surgery

## 2015-10-30 ENCOUNTER — Encounter (HOSPITAL_COMMUNITY): Payer: Self-pay

## 2015-10-30 DIAGNOSIS — G4700 Insomnia, unspecified: Secondary | ICD-10-CM | POA: Diagnosis not present

## 2015-10-30 DIAGNOSIS — Z01818 Encounter for other preprocedural examination: Secondary | ICD-10-CM | POA: Insufficient documentation

## 2015-10-30 DIAGNOSIS — Z79899 Other long term (current) drug therapy: Secondary | ICD-10-CM | POA: Diagnosis not present

## 2015-10-30 DIAGNOSIS — Z7902 Long term (current) use of antithrombotics/antiplatelets: Secondary | ICD-10-CM | POA: Insufficient documentation

## 2015-10-30 DIAGNOSIS — I480 Paroxysmal atrial fibrillation: Secondary | ICD-10-CM | POA: Diagnosis not present

## 2015-10-30 DIAGNOSIS — E785 Hyperlipidemia, unspecified: Secondary | ICD-10-CM | POA: Insufficient documentation

## 2015-10-30 DIAGNOSIS — M5127 Other intervertebral disc displacement, lumbosacral region: Secondary | ICD-10-CM | POA: Diagnosis not present

## 2015-10-30 DIAGNOSIS — Z955 Presence of coronary angioplasty implant and graft: Secondary | ICD-10-CM | POA: Diagnosis not present

## 2015-10-30 DIAGNOSIS — I10 Essential (primary) hypertension: Secondary | ICD-10-CM | POA: Insufficient documentation

## 2015-10-30 DIAGNOSIS — F329 Major depressive disorder, single episode, unspecified: Secondary | ICD-10-CM | POA: Diagnosis not present

## 2015-10-30 DIAGNOSIS — Z01812 Encounter for preprocedural laboratory examination: Secondary | ICD-10-CM | POA: Insufficient documentation

## 2015-10-30 DIAGNOSIS — Z7952 Long term (current) use of systemic steroids: Secondary | ICD-10-CM | POA: Insufficient documentation

## 2015-10-30 DIAGNOSIS — I2510 Atherosclerotic heart disease of native coronary artery without angina pectoris: Secondary | ICD-10-CM | POA: Diagnosis not present

## 2015-10-30 LAB — CBC
HCT: 39 % (ref 36.0–46.0)
HEMOGLOBIN: 12.3 g/dL (ref 12.0–15.0)
MCH: 28.7 pg (ref 26.0–34.0)
MCHC: 31.5 g/dL (ref 30.0–36.0)
MCV: 90.9 fL (ref 78.0–100.0)
Platelets: 221 10*3/uL (ref 150–400)
RBC: 4.29 MIL/uL (ref 3.87–5.11)
RDW: 13.2 % (ref 11.5–15.5)
WBC: 9.6 10*3/uL (ref 4.0–10.5)

## 2015-10-30 LAB — BASIC METABOLIC PANEL
Anion gap: 10 (ref 5–15)
BUN: 10 mg/dL (ref 6–20)
CHLORIDE: 96 mmol/L — AB (ref 101–111)
CO2: 27 mmol/L (ref 22–32)
Calcium: 9 mg/dL (ref 8.9–10.3)
Creatinine, Ser: 0.79 mg/dL (ref 0.44–1.00)
GFR calc Af Amer: >60 mL/min (ref >60)
GFR calc non Af Amer: >60 mL/min (ref >60)
GLUCOSE: 132 mg/dL — AB (ref 65–99)
POTASSIUM: 3.8 mmol/L (ref 3.5–5.1)
Sodium: 133 mmol/L — ABNORMAL LOW (ref 135–145)

## 2015-10-30 LAB — SURGICAL PCR SCREEN
MRSA, PCR: NEGATIVE
Staphylococcus aureus: NEGATIVE

## 2015-10-30 NOTE — Progress Notes (Signed)
PCP is [facility], applications  Cardiologist is [employer]. LOV was 10/25/15. Patient denied having any acute cardiac or pulmonary issues, but did inform Nurse that she has one stent placed in her heart.  Patient stated she was instructed to stop taking Plavix 5 days before surgery, and that she would not take Plavix tonight.  Patient informed Nurse that she takes all her medications at night, and she even takes 50 mg of Metoprolol at bedtime instead of taking 25 mg twice a day.  Will send chart to Anesthesia for review.

## 2015-10-30 NOTE — Progress Notes (Signed)
Left voicemail with Janett Billow at Dr. Clarice Pole office requesting MD to sign orders.

## 2015-10-31 NOTE — Progress Notes (Signed)
Anesthesia Chart Review:  Pt is a 81 year old female scheduled for L5-S1 microdiscectomy on 11/04/2015 with Kristeen Miss, MD.   PCP is Carolann Littler, MD. Cardiologist is [name], MD who has cleared pt for surgery.   PMH includes:  CAD (RCA stent 2005 in [place]), PAF, HTN, hyperlipidemia, temporal arteritis. Never smoker. BMI 29. S/p PLIF 04/25/13.   Medications include: amlodipine, plavix, hctz, potassium, losartan, metoprolol, prednisone. Pt's last dose plavix 10/29/15.   Preoperative labs reviewed.    EKG 10/25/15: sinus bradycardia (59 bpm)  Nuclear stress test 06/29/14: Normal stress nuclear study.  No evidence of ischemia.  Normal LV function. LV Ejection Fraction: 70%.  LV Wall Motion:  NL LV Function; NL Wall Motion.  Cardiac cath 12/09/12:  1. Stable single vessel CAD with patent mid RCA stent. (mid LAD 20%, RCA 20% just beyond stent) 2. Normal LV systolic function 3. Normal PA pressures  Echo 10/30/12:  - Left ventricle: The cavity size was normal. Wall thickness was normal. Systolic function was vigorous. The estimated ejection fraction was in the range of 65% to 70%. Wall motion was normal; there were no regional wall motion abnormalities. - Pulmonary arteries: PA peak pressure: 67mm Hg (S).  If no changes, I anticipate pt can proceed with surgery as scheduled.   Willeen Cass, FNP-BC [facility]/Anesthesiology Phone: [phone] 10/31/2015 12:30 PM

## 2015-11-04 ENCOUNTER — Encounter (HOSPITAL_COMMUNITY)
Admission: RE | Disposition: A | Discharge status: Home / Self Care | Payer: Self-pay | Source: Ambulatory Visit | Attending: Neurological Surgery

## 2015-11-04 ENCOUNTER — Ambulatory Visit (HOSPITAL_COMMUNITY): Payer: Medicare Other

## 2015-11-04 ENCOUNTER — Ambulatory Visit (HOSPITAL_COMMUNITY): Payer: Medicare Other | Admitting: Anesthesiology

## 2015-11-04 ENCOUNTER — Ambulatory Visit (HOSPITAL_COMMUNITY): Payer: Medicare Other | Admitting: Emergency Medicine

## 2015-11-04 ENCOUNTER — Encounter (HOSPITAL_COMMUNITY): Payer: Self-pay | Admitting: *Deleted

## 2015-11-04 ENCOUNTER — Observation Stay (HOSPITAL_COMMUNITY)
Admission: RE | Admit: 2015-11-04 | Discharge: 2015-11-05 | Disposition: A | Discharge status: Home / Self Care | Payer: Medicare Other | Source: Ambulatory Visit | Attending: Neurological Surgery | Admitting: Neurological Surgery

## 2015-11-04 DIAGNOSIS — M1990 Unspecified osteoarthritis, unspecified site: Secondary | ICD-10-CM | POA: Diagnosis not present

## 2015-11-04 DIAGNOSIS — F329 Major depressive disorder, single episode, unspecified: Secondary | ICD-10-CM | POA: Insufficient documentation

## 2015-11-04 DIAGNOSIS — M5117 Intervertebral disc disorders with radiculopathy, lumbosacral region: Secondary | ICD-10-CM | POA: Diagnosis present

## 2015-11-04 DIAGNOSIS — Z955 Presence of coronary angioplasty implant and graft: Secondary | ICD-10-CM | POA: Diagnosis not present

## 2015-11-04 DIAGNOSIS — Z7902 Long term (current) use of antithrombotics/antiplatelets: Secondary | ICD-10-CM | POA: Diagnosis not present

## 2015-11-04 DIAGNOSIS — F419 Anxiety disorder, unspecified: Secondary | ICD-10-CM | POA: Diagnosis not present

## 2015-11-04 DIAGNOSIS — I10 Essential (primary) hypertension: Secondary | ICD-10-CM | POA: Insufficient documentation

## 2015-11-04 DIAGNOSIS — E785 Hyperlipidemia, unspecified: Secondary | ICD-10-CM | POA: Insufficient documentation

## 2015-11-04 DIAGNOSIS — M069 Rheumatoid arthritis, unspecified: Secondary | ICD-10-CM | POA: Diagnosis not present

## 2015-11-04 DIAGNOSIS — M810 Age-related osteoporosis without current pathological fracture: Secondary | ICD-10-CM | POA: Diagnosis not present

## 2015-11-04 DIAGNOSIS — I2510 Atherosclerotic heart disease of native coronary artery without angina pectoris: Secondary | ICD-10-CM | POA: Diagnosis not present

## 2015-11-04 DIAGNOSIS — Z419 Encounter for procedure for purposes other than remedying health state, unspecified: Secondary | ICD-10-CM

## 2015-11-04 DIAGNOSIS — Z79899 Other long term (current) drug therapy: Secondary | ICD-10-CM | POA: Insufficient documentation

## 2015-11-04 DIAGNOSIS — Z7952 Long term (current) use of systemic steroids: Secondary | ICD-10-CM | POA: Diagnosis not present

## 2015-11-04 DIAGNOSIS — Z981 Arthrodesis status: Secondary | ICD-10-CM | POA: Diagnosis not present

## 2015-11-04 DIAGNOSIS — M5127 Other intervertebral disc displacement, lumbosacral region: Secondary | ICD-10-CM

## 2015-11-04 DIAGNOSIS — I480 Paroxysmal atrial fibrillation: Secondary | ICD-10-CM | POA: Insufficient documentation

## 2015-11-04 DIAGNOSIS — M5126 Other intervertebral disc displacement, lumbar region: Secondary | ICD-10-CM | POA: Diagnosis not present

## 2015-11-04 HISTORY — DX: Other intervertebral disc displacement, lumbosacral region: M51.27

## 2015-11-04 HISTORY — PX: LUMBAR LAMINECTOMY/DECOMPRESSION MICRODISCECTOMY: SHX5026

## 2015-11-04 SURGERY — LUMBAR LAMINECTOMY/DECOMPRESSION MICRODISCECTOMY 1 LEVEL
Anesthesia: General | Site: Spine Lumbar | Laterality: Right

## 2015-11-04 MED ORDER — THROMBIN 5000 UNITS EX SOLR
OROMUCOSAL | Status: DC | PRN
  Administered 2015-11-04: 18:00:00 via TOPICAL

## 2015-11-04 MED ORDER — DEXAMETHASONE SODIUM PHOSPHATE 10 MG/ML IJ SOLN
INTRAMUSCULAR | Status: DC | PRN
  Administered 2015-11-04: 10 mg via INTRAVENOUS

## 2015-11-04 MED ORDER — ONDANSETRON HCL 4 MG/2ML IJ SOLN: 4.0000 mg | INTRAMUSCULAR | Status: DC | PRN

## 2015-11-04 MED ORDER — EPHEDRINE SULFATE 50 MG/ML IJ SOLN
INTRAMUSCULAR | Status: DC | PRN
  Administered 2015-11-04: 10 mg via INTRAVENOUS
  Administered 2015-11-04: 15 mg via INTRAVENOUS
  Administered 2015-11-04: 5 mg via INTRAVENOUS
  Administered 2015-11-04: 10 mg via INTRAVENOUS

## 2015-11-04 MED ORDER — ONDANSETRON HCL 4 MG/2ML IJ SOLN
INTRAMUSCULAR | Status: DC | PRN
  Administered 2015-11-04: 4 mg via INTRAVENOUS

## 2015-11-04 MED ORDER — LIDOCAINE 2% (20 MG/ML) 5 ML SYRINGE
INTRAMUSCULAR | Status: AC
  Filled 2015-11-04: qty 5

## 2015-11-04 MED ORDER — HYDROCODONE-ACETAMINOPHEN 5-325 MG PO TABS: 1.0000 | ORAL_TABLET | ORAL | Status: DC | PRN

## 2015-11-04 MED ORDER — PHENYLEPHRINE HCL 10 MG/ML IJ SOLN
INTRAVENOUS | Status: DC | PRN
  Administered 2015-11-04: 20 ug/min via INTRAVENOUS

## 2015-11-04 MED ORDER — POLYETHYLENE GLYCOL 3350 17 G PO PACK
17.0000 g | PACK | Freq: Every day | ORAL | Status: DC | PRN
Start: 2015-11-04 — End: 2015-11-05

## 2015-11-04 MED ORDER — PHENOL 1.4 % MT LIQD: 1.0000 | OROMUCOSAL | Status: DC | PRN

## 2015-11-04 MED ORDER — ACETAMINOPHEN 10 MG/ML IV SOLN
INTRAVENOUS | Status: DC | PRN
  Administered 2015-11-04: 1000 mg via INTRAVENOUS

## 2015-11-04 MED ORDER — DOCUSATE SODIUM 100 MG PO CAPS
100.0000 mg | ORAL_CAPSULE | Freq: Two times a day (BID) | ORAL | Status: DC
  Administered 2015-11-04 – 2015-11-05 (×2): 100 mg via ORAL
  Filled 2015-11-04 (×2): qty 1

## 2015-11-04 MED ORDER — SODIUM CHLORIDE 0.9% FLUSH: 3.0000 mL | INTRAVENOUS | Status: DC | PRN

## 2015-11-04 MED ORDER — FENTANYL CITRATE (PF) 250 MCG/5ML IJ SOLN
INTRAMUSCULAR | Status: AC
  Filled 2015-11-04: qty 5

## 2015-11-04 MED ORDER — HEMOSTATIC AGENTS (NO CHARGE) OPTIME
TOPICAL | Status: DC | PRN
  Administered 2015-11-04: 1 via TOPICAL

## 2015-11-04 MED ORDER — CEFAZOLIN IN D5W 1 GM/50ML IV SOLN
1.0000 g | Freq: Three times a day (TID) | INTRAVENOUS | Status: AC
  Administered 2015-11-05 (×2): 1 g via INTRAVENOUS
  Filled 2015-11-04: qty 50

## 2015-11-04 MED ORDER — HYDROCODONE-ACETAMINOPHEN 10-325 MG PO TABS
1.0000 | ORAL_TABLET | Freq: Four times a day (QID) | ORAL | Status: DC | PRN
  Administered 2015-11-04 – 2015-11-05 (×3): 2 via ORAL
  Filled 2015-11-04 (×3): qty 2

## 2015-11-04 MED ORDER — ACETAMINOPHEN 325 MG PO TABS: 650.0000 mg | ORAL_TABLET | ORAL | Status: DC | PRN

## 2015-11-04 MED ORDER — POTASSIUM CHLORIDE ER 10 MEQ PO TBCR
10.0000 meq | EXTENDED_RELEASE_TABLET | Freq: Every day | ORAL | Status: DC
  Administered 2015-11-05: 10 meq via ORAL
  Filled 2015-11-04 (×2): qty 1

## 2015-11-04 MED ORDER — SODIUM CHLORIDE 0.9% FLUSH: 3.0000 mL | Freq: Two times a day (BID) | INTRAVENOUS | Status: DC

## 2015-11-04 MED ORDER — ROCURONIUM BROMIDE 50 MG/5ML IV SOLN
INTRAVENOUS | Status: AC
  Filled 2015-11-04: qty 1

## 2015-11-04 MED ORDER — CHLORHEXIDINE GLUCONATE CLOTH 2 % EX PADS: 6.0000 | MEDICATED_PAD | Freq: Once | CUTANEOUS | Status: DC

## 2015-11-04 MED ORDER — SODIUM CHLORIDE 0.9 % IV SOLN: 250.0000 mL | INTRAVENOUS | Status: DC

## 2015-11-04 MED ORDER — BISACODYL 10 MG RE SUPP: 10.0000 mg | Freq: Every day | RECTAL | Status: DC | PRN

## 2015-11-04 MED ORDER — MORPHINE SULFATE (PF) 2 MG/ML IV SOLN
1.0000 mg | INTRAVENOUS | Status: DC | PRN
  Administered 2015-11-04: 4 mg via INTRAVENOUS
  Administered 2015-11-04 (×2): 2 mg via INTRAVENOUS
  Filled 2015-11-04: qty 1
  Filled 2015-11-04: qty 2
  Filled 2015-11-04: qty 1

## 2015-11-04 MED ORDER — TEMAZEPAM 15 MG PO CAPS
15.0000 mg | ORAL_CAPSULE | Freq: Every evening | ORAL | Status: DC | PRN
  Administered 2015-11-04: 15 mg via ORAL
  Filled 2015-11-04: qty 1

## 2015-11-04 MED ORDER — METOPROLOL TARTRATE 25 MG PO TABS
50.0000 mg | ORAL_TABLET | Freq: Two times a day (BID) | ORAL | Status: DC
  Administered 2015-11-04 – 2015-11-05 (×2): 50 mg via ORAL
  Filled 2015-11-04 (×2): qty 2

## 2015-11-04 MED ORDER — 0.9 % SODIUM CHLORIDE (POUR BTL) OPTIME
TOPICAL | Status: DC | PRN
  Administered 2015-11-04: 1000 mL

## 2015-11-04 MED ORDER — LOSARTAN POTASSIUM 50 MG PO TABS
100.0000 mg | ORAL_TABLET | Freq: Every day | ORAL | Status: DC
  Administered 2015-11-05: 100 mg via ORAL
  Filled 2015-11-04: qty 2

## 2015-11-04 MED ORDER — HYDROCHLOROTHIAZIDE 12.5 MG PO CAPS
12.5000 mg | ORAL_CAPSULE | Freq: Every day | ORAL | Status: DC
  Administered 2015-11-05: 12.5 mg via ORAL
  Filled 2015-11-04: qty 1

## 2015-11-04 MED ORDER — AMLODIPINE BESYLATE 5 MG PO TABS
5.0000 mg | ORAL_TABLET | Freq: Every day | ORAL | Status: DC
  Administered 2015-11-05: 5 mg via ORAL
  Filled 2015-11-04: qty 1

## 2015-11-04 MED ORDER — PROPOFOL 10 MG/ML IV BOLUS
INTRAVENOUS | Status: DC | PRN
  Administered 2015-11-04: 40 mg via INTRAVENOUS
  Administered 2015-11-04: 100 mg via INTRAVENOUS

## 2015-11-04 MED ORDER — ESCITALOPRAM OXALATE 10 MG PO TABS
10.0000 mg | ORAL_TABLET | Freq: Every day | ORAL | Status: DC
  Administered 2015-11-05: 10 mg via ORAL
  Filled 2015-11-04 (×2): qty 1

## 2015-11-04 MED ORDER — LIDOCAINE-EPINEPHRINE 1 %-1:100000 IJ SOLN
INTRAMUSCULAR | Status: DC | PRN
  Administered 2015-11-04: 4 mL

## 2015-11-04 MED ORDER — ONDANSETRON HCL 4 MG/2ML IJ SOLN
INTRAMUSCULAR | Status: AC
  Filled 2015-11-04: qty 2

## 2015-11-04 MED ORDER — THROMBIN 5000 UNITS EX SOLR
CUTANEOUS | Status: DC | PRN
  Administered 2015-11-04 (×2): 5000 [IU] via TOPICAL

## 2015-11-04 MED ORDER — SUGAMMADEX SODIUM 200 MG/2ML IV SOLN
INTRAVENOUS | Status: DC | PRN
  Administered 2015-11-04: 160 mg via INTRAVENOUS

## 2015-11-04 MED ORDER — ACETAMINOPHEN 650 MG RE SUPP
650.0000 mg | RECTAL | Status: DC | PRN
Start: 2015-11-04 — End: 2015-11-05

## 2015-11-04 MED ORDER — FLEET ENEMA 7-19 GM/118ML RE ENEM: 1.0000 | ENEMA | Freq: Once | RECTAL | Status: DC | PRN

## 2015-11-04 MED ORDER — MENTHOL 3 MG MT LOZG: 1.0000 | LOZENGE | OROMUCOSAL | Status: DC | PRN

## 2015-11-04 MED ORDER — ROCURONIUM BROMIDE 100 MG/10ML IV SOLN
INTRAVENOUS | Status: DC | PRN
  Administered 2015-11-04: 50 mg via INTRAVENOUS

## 2015-11-04 MED ORDER — SODIUM CHLORIDE 0.9 % IR SOLN
Status: DC | PRN
  Administered 2015-11-04: 18:00:00

## 2015-11-04 MED ORDER — LIDOCAINE 2% (20 MG/ML) 5 ML SYRINGE
INTRAMUSCULAR | Status: DC | PRN
  Administered 2015-11-04: 60 mg via INTRAVENOUS

## 2015-11-04 MED ORDER — OXYCODONE-ACETAMINOPHEN 5-325 MG PO TABS
1.0000 | ORAL_TABLET | ORAL | Status: DC | PRN
  Administered 2015-11-05 (×2): 2 via ORAL
  Filled 2015-11-04 (×2): qty 2

## 2015-11-04 MED ORDER — PREDNISONE 5 MG PO TABS
8.0000 mg | ORAL_TABLET | Freq: Every day | ORAL | Status: DC
  Administered 2015-11-05: 8 mg via ORAL
  Filled 2015-11-04: qty 3

## 2015-11-04 MED ORDER — SENNA 8.6 MG PO TABS
1.0000 | ORAL_TABLET | Freq: Two times a day (BID) | ORAL | Status: DC
  Administered 2015-11-04 – 2015-11-05 (×2): 8.6 mg via ORAL
  Filled 2015-11-04 (×2): qty 1

## 2015-11-04 MED ORDER — LACTATED RINGERS IV SOLN
INTRAVENOUS | Status: DC
  Administered 2015-11-04 (×2): via INTRAVENOUS

## 2015-11-04 MED ORDER — BUPIVACAINE HCL (PF) 0.5 % IJ SOLN
INTRAMUSCULAR | Status: DC | PRN
  Administered 2015-11-04: 4 mL

## 2015-11-04 MED ORDER — CEFAZOLIN SODIUM-DEXTROSE 2-4 GM/100ML-% IV SOLN
2.0000 g | INTRAVENOUS | Status: AC
  Administered 2015-11-04: 2 g via INTRAVENOUS
  Filled 2015-11-04: qty 100

## 2015-11-04 MED ORDER — LOSARTAN POTASSIUM 50 MG PO TABS: 100.0000 mg | ORAL_TABLET | Freq: Every day | ORAL | Status: DC

## 2015-11-04 MED ORDER — FENTANYL CITRATE (PF) 250 MCG/5ML IJ SOLN
INTRAMUSCULAR | Status: DC | PRN
  Administered 2015-11-04 (×5): 50 ug via INTRAVENOUS

## 2015-11-04 MED ORDER — SUGAMMADEX SODIUM 200 MG/2ML IV SOLN
INTRAVENOUS | Status: AC
  Filled 2015-11-04: qty 2

## 2015-11-04 MED ORDER — HYDROMORPHONE HCL 1 MG/ML IJ SOLN: 0.2500 mg | INTRAMUSCULAR | Status: DC | PRN

## 2015-11-04 SURGICAL SUPPLY — 51 items
BAG DECANTER FOR FLEXI CONT (MISCELLANEOUS) ×3 IMPLANT
BUR ACORN 6.0 (BURR) IMPLANT
BUR ACORN 6.0MM (BURR)
BUR MATCHSTICK NEURO 3.0 LAGG (BURR) ×3 IMPLANT
CANISTER SUCT 3000ML PPV (MISCELLANEOUS) ×3 IMPLANT
DECANTER SPIKE VIAL GLASS SM (MISCELLANEOUS) ×3 IMPLANT
DERMABOND ADVANCED (GAUZE/BANDAGES/DRESSINGS) ×2
DERMABOND ADVANCED .7 DNX12 (GAUZE/BANDAGES/DRESSINGS) ×1 IMPLANT
DEVICE DISSECT PLASMABLAD 3.0S (MISCELLANEOUS) ×1 IMPLANT
DRAPE HALF SHEET 40X57 (DRAPES) ×3 IMPLANT
DRAPE LAPAROTOMY T 102X78X121 (DRAPES) ×3 IMPLANT
DRAPE MICROSCOPE LEICA (MISCELLANEOUS) ×3 IMPLANT
DRAPE POUCH INSTRU U-SHP 10X18 (DRAPES) ×3 IMPLANT
DURAPREP 26ML APPLICATOR (WOUND CARE) ×3 IMPLANT
ELECT REM PT RETURN 9FT ADLT (ELECTROSURGICAL) ×3
ELECTRODE REM PT RTRN 9FT ADLT (ELECTROSURGICAL) ×1 IMPLANT
GAUZE SPONGE 4X4 12PLY STRL (GAUZE/BANDAGES/DRESSINGS) IMPLANT
GLOVE BIO SURGEON STRL SZ 6.5 (GLOVE) ×4 IMPLANT
GLOVE BIO SURGEON STRL SZ8 (GLOVE) ×3 IMPLANT
GLOVE BIO SURGEONS STRL SZ 6.5 (GLOVE) ×2
GLOVE BIOGEL PI IND STRL 7.0 (GLOVE) ×2 IMPLANT
GLOVE BIOGEL PI IND STRL 7.5 (GLOVE) ×1 IMPLANT
GLOVE BIOGEL PI IND STRL 8.5 (GLOVE) ×2 IMPLANT
GLOVE BIOGEL PI INDICATOR 7.0 (GLOVE) ×4
GLOVE BIOGEL PI INDICATOR 7.5 (GLOVE) ×2
GLOVE BIOGEL PI INDICATOR 8.5 (GLOVE) ×4
GLOVE ECLIPSE 8.5 STRL (GLOVE) ×6 IMPLANT
GLOVE INDICATOR 8.5 STRL (GLOVE) ×3 IMPLANT
GOWN STRL REUS W/ TWL LRG LVL3 (GOWN DISPOSABLE) ×1 IMPLANT
GOWN STRL REUS W/ TWL XL LVL3 (GOWN DISPOSABLE) ×3 IMPLANT
GOWN STRL REUS W/TWL 2XL LVL3 (GOWN DISPOSABLE) ×3 IMPLANT
GOWN STRL REUS W/TWL LRG LVL3 (GOWN DISPOSABLE) ×2
GOWN STRL REUS W/TWL XL LVL3 (GOWN DISPOSABLE) ×6
KIT BASIN OR (CUSTOM PROCEDURE TRAY) ×3 IMPLANT
KIT ROOM TURNOVER OR (KITS) ×3 IMPLANT
NEEDLE HYPO 22GX1.5 SAFETY (NEEDLE) ×3 IMPLANT
NEEDLE SPNL 20GX3.5 QUINCKE YW (NEEDLE) IMPLANT
NS IRRIG 1000ML POUR BTL (IV SOLUTION) ×3 IMPLANT
PACK LAMINECTOMY NEURO (CUSTOM PROCEDURE TRAY) ×3 IMPLANT
PAD ARMBOARD 7.5X6 YLW CONV (MISCELLANEOUS) ×9 IMPLANT
PATTIES SURGICAL .5 X1 (DISPOSABLE) ×3 IMPLANT
PLASMABLADE 3.0S (MISCELLANEOUS) ×3
RUBBERBAND STERILE (MISCELLANEOUS) ×6 IMPLANT
SPONGE SURGIFOAM ABS GEL SZ50 (HEMOSTASIS) ×3 IMPLANT
SUT VIC AB 1 CT1 18XBRD ANBCTR (SUTURE) ×1 IMPLANT
SUT VIC AB 1 CT1 8-18 (SUTURE) ×2
SUT VIC AB 2-0 CP2 18 (SUTURE) ×3 IMPLANT
SUT VIC AB 3-0 SH 8-18 (SUTURE) ×3 IMPLANT
TOWEL OR 17X24 6PK STRL BLUE (TOWEL DISPOSABLE) ×3 IMPLANT
TOWEL OR 17X26 10 PK STRL BLUE (TOWEL DISPOSABLE) ×3 IMPLANT
WATER STERILE IRR 1000ML POUR (IV SOLUTION) ×3 IMPLANT

## 2015-11-04 NOTE — Anesthesia Postprocedure Evaluation (Signed)
Anesthesia Post Note  Patient: Brantleigh L Biehl  Procedure(s) Performed: Procedure(s) (LRB): Right Lumbar Five-Sacral One Microdiskectomy (Right)  Patient location during evaluation: PACU Anesthesia Type: General Level of consciousness: awake and alert Pain management: pain level controlled Vital Signs Assessment: post-procedure vital signs reviewed and stable Respiratory status: spontaneous breathing, nonlabored ventilation, respiratory function stable and patient connected to nasal cannula oxygen Cardiovascular status: blood pressure returned to baseline and stable Postop Assessment: no signs of nausea or vomiting Anesthetic complications: no    Last Vitals:  Vitals:   11/04/15 1850 11/04/15 1900  BP: (!) 125/54   Pulse: 76   Resp: 18   Temp:  36.4 C    Last Pain:  Vitals:   11/04/15 1850  TempSrc:   PainSc: 0-No pain                 Jovanne Riggenbach A

## 2015-11-04 NOTE — H&P (Signed)
Betty Jackson is an 81 y.o. female.   Chief Complaint: Back bilateral lower extremity pain worse on the right HPI: He is a 81 year old individual who about 3 years ago underwent surgical decompression from L2-L4 for a severe spondylolisthesis with high-grade stenosis. She did well after that surgery but in the recent months has increased pain in the buttock and right lower extremity with ensuing weakness recent MRI demonstrates presence of a large extruded fragment of disc at L5-S1 that obliterates the central portion of the spinal canal at that level. Patient has been advised regarding the need for surgical decompression and she is now admitted for that.  Past Medical History:  Diagnosis Date  . Anxiety    "more so the last few days" (10/28/2012)  . Anxiety state, unspecified 10/28/2012  . Arthritis     Right hemiarthroplasty after hip fracture 06/2011  . Arthritis    "hands" (10/28/2012)  . Bruises easily    d/t being on prednisone and plavix  . CAD (coronary artery disease)    a. Stent RCA in [facility];  b. Cath approx 2009 - nonobs per pt report.  . Cataract    immature on the left eye  . Chronic lower back pain    scoliosis  . Complication of anesthesia    pt has a very high tolerance to meds  . DDD (degenerative disc disease)   . Depression   . Diverticulitis    hx of  . Diverticulosis   . Enteritis   . Exertional shortness of breath    "every now and then" (10/28/2012)  . Giant cell arteritis ([place])   . Hemorrhoids   . History of scabies   . Hyperlipidemia    takes Lipitor daily  . Hypertension    takes Amlodipine,Losartan,Metoprolol,and HCTZ daily  . Incontinence of urine   . Insomnia    takes Restoril nightly  . Joint pain   . Migraines    "back in my 20's; none since" (10/28/2012)  . Osteopenia   . Osteoporosis   . PAF (paroxysmal atrial fibrillation) ([place])    a. lone epidode in 2011 according to notes.  . Scoliosis   . Sinus bradycardia    a. on chronic bb  .  Temporal arteritis ([place]) 2011   a. followed @ [facility]; potential flareup 10/28/2012/notes 10/28/2012  . Urinary frequency   . Vocal cord dysfunction    "they don't operate properly" (10/28/2012)    Past Surgical History:  Procedure Laterality Date  . ABDOMINAL HYSTERECTOMY  ~ 1984   vaginally  . BACK SURGERY  7-72yrs ago   X Stop  . BLADDER SUSPENSION  2001  . BREAST BIOPSY Right   . CATARACT EXTRACTION W/ INTRAOCULAR LENS IMPLANT Right ~ 08/2012  . COLONOSCOPY  01/26/2012   Procedure: COLONOSCOPY;  Surgeon: Ladene Artist, MD,FACG;  Location: [facility] ENDOSCOPY;  Service: Endoscopy;  Laterality: N/A;  note the EGD is possible  . CORONARY ANGIOPLASTY WITH STENT PLACEMENT  2006   X 1 stent  . EPIDURAL BLOCK INJECTION    . ESOPHAGOGASTRODUODENOSCOPY  01/26/2012   Procedure: ESOPHAGOGASTRODUODENOSCOPY (EGD);  Surgeon: Ladene Artist, MD,FACG;  Location: [facility] ENDOSCOPY;  Service: Endoscopy;  Laterality: N/A;  . HEMIARTHROPLASTY HIP Right 2012  . LAPAROSCOPIC CHOLECYSTECTOMY  2001  . LUMBAR FUSION  03/2013  . moles removed that required stiches     one on leg and one on face  . TEMPORAL ARTERY BIOPSY / LIGATION Bilateral 2011  . TONSILLECTOMY AND  ADENOIDECTOMY     at age 51  . TUBAL LIGATION  ~ 1982  . X-STOP IMPLANTATION  ~ 2010   "lower back" (10/28/2012)    Family History  Problem Relation Age of Onset  . Brain cancer Mother 69  . Heart attack Father   . Breast cancer Daughter 62  . Heart disease Other   . Hypertension Other   . Arthritis Neg Hx   . Colon cancer Neg Hx    Social History:  reports that she has never smoked. She has never used smokeless tobacco. She reports that she drinks alcohol. She reports that she does not use drugs.  Allergies:  Allergies  Allergen Reactions  . Dexilant [Dexlansoprazole] Diarrhea  . Dextromethorphan Rash    Medications Prior to Admission  Medication Sig Dispense Refill  . amLODipine (NORVASC) 5 MG tablet Take 1 tablet (5 mg total) by mouth  daily. 90 tablet 0  . calcium carbonate (OS-CAL) 600 MG TABS Take 600 mg by mouth daily.    . cholecalciferol (VITAMIN D) 1000 UNITS tablet Take 1,000 Units by mouth daily.     . clopidogrel (PLAVIX) 75 MG tablet TAKE 1 TABLET BY MOUTH EVERY DAY WITH BREAKFAST 30 tablet 11  . escitalopram (LEXAPRO) 10 MG tablet TAKE 1 TABLET BY MOUTH EVERY DAY 30 tablet 5  . hydrochlorothiazide (MICROZIDE) 12.5 MG capsule TAKE 1 CAPSULE (12.5 MG TOTAL) BY MOUTH DAILY. 30 capsule 11  . HYDROcodone-acetaminophen (NORCO) 10-325 MG tablet Take 1 tablet by mouth every 6 (six) hours as needed. for pain  0  . KLOR-CON 10 10 MEQ tablet TAKE 1 TABLET (10 MEQ TOTAL) BY MOUTH DAILY. 30 tablet 4  . losartan (COZAAR) 100 MG tablet TAKE 1 TABLET BY MOUTH DAILY 30 tablet 10  . metoprolol (LOPRESSOR) 50 MG tablet TAKE 1 TABLET BY MOUTH TWICE DAILY (Patient taking differently: TAKE 25MG  BY MOUTH TWICE DAILY) 180 tablet 2  . OVER THE COUNTER MEDICATION Take 2 tablets by mouth daily. Juice Plus Vegetable    . predniSONE (DELTASONE) 1 MG tablet Take 8 mg by mouth daily.   2  . temazepam (RESTORIL) 15 MG capsule TAKE ONE CAPSULE BY MOUTH AT BEDTIME AS NEEDED FOR SLEEP 30 capsule 5  . vitamin C (ASCORBIC ACID) 500 MG tablet Take 1,000 mg by mouth daily as needed (for sore throat).       No results found for this or any previous visit (from the past 48 hour(s)). No results found.  Review of Systems  HENT: Negative.   Eyes: Negative.   Respiratory: Negative.   Cardiovascular: Negative.   Gastrointestinal: Negative.   Genitourinary: Negative.   Musculoskeletal: Positive for back pain.  Skin: Negative.   Neurological: Positive for sensory change, focal weakness and weakness.  Endo/Heme/Allergies: Negative.   Psychiatric/Behavioral: Negative.     Blood pressure (!) 153/53, pulse (!) 50, temperature 98 F (36.7 C), temperature source Oral, resp. rate 20, weight 76.7 kg (169 lb), SpO2 97 %. Physical Exam  Constitutional: She  is oriented to person, place, and time. She appears well-developed and well-nourished.  Stigmata of rheumatoid arthritis particularly in hands  HENT:  Head: Normocephalic and atraumatic.  Eyes: Conjunctivae and EOM are normal. Pupils are equal, round, and reactive to light.  Cardiovascular: Normal rate and regular rhythm.   Respiratory: Effort normal and breath sounds normal.  GI: Soft. Bowel sounds are normal.  Musculoskeletal:  Tender to palpation the back positive straight leg raising at 15 on the right  side negative to 45 on the left side. Patrick's maneuver is negative bilaterally  Neurological: She is alert and oriented to person, place, and time.  Motor function reveals weakness in the iliopsoas quadricep and gastroc on the right side compared to left side at 4 minus out of 5. Sensation appears diminished in right distal lower extremity.  Skin: Skin is warm.  Psychiatric: She has a normal mood and affect. Her behavior is normal. Judgment and thought content normal.     Assessment/Plan Herniated nucleus pulposus L5-S1 eccentric to the right  Laminotomy foraminotomy decompression L5-S1 right  Earleen Newport, MD 11/04/2015, 2:02 PM

## 2015-11-04 NOTE — Anesthesia Procedure Notes (Signed)
Procedure Name: Intubation Date/Time: 11/04/2015 5:02 PM Performed by: Mervyn Gay Pre-anesthesia Checklist: Patient identified, Patient being monitored, Timeout performed, Emergency Drugs available and Suction available Patient Re-evaluated:Patient Re-evaluated prior to inductionOxygen Delivery Method: Circle System Utilized Preoxygenation: Pre-oxygenation with 100% oxygen Intubation Type: IV induction Ventilation: Mask ventilation without difficulty Laryngoscope Size: Miller and 3 Grade View: Grade II Tube type: Oral Tube size: 7.0 mm Number of attempts: 1 Airway Equipment and Method: Stylet Placement Confirmation: ETT inserted through vocal cords under direct vision,  positive ETCO2 and breath sounds checked- equal and bilateral Secured at: 21 cm Tube secured with: Tape Dental Injury: Teeth and Oropharynx as per pre-operative assessment

## 2015-11-04 NOTE — Anesthesia Preprocedure Evaluation (Addendum)
Anesthesia Evaluation  Patient identified by MRN, date of birth, ID band Patient awake    Reviewed: Allergy & Precautions, H&P , NPO status , Patient's Chart, lab work & pertinent test results, reviewed documented beta blocker date and time   Airway Mallampati: II  TM Distance: >3 FB Neck ROM: Full    Dental no notable dental hx. (+) Teeth Intact, Dental Advisory Given   Pulmonary neg pulmonary ROS,    Pulmonary exam normal breath sounds clear to auscultation       Cardiovascular hypertension, Pt. on medications and On Home Beta Blockers + CAD, + Cardiac Stents and + Peripheral Vascular Disease   Rhythm:Regular Rate:Normal     Neuro/Psych  Headaches, Anxiety Depression    GI/Hepatic Neg liver ROS, GERD  Medicated and Controlled,  Endo/Other  negative endocrine ROS  Renal/GU negative Renal ROS  negative genitourinary   Musculoskeletal  (+) Arthritis , Osteoarthritis,    Abdominal   Peds  Hematology negative hematology ROS (+) anemia ,   Anesthesia Other Findings   Reproductive/Obstetrics negative OB ROS                            Anesthesia Physical Anesthesia Plan  ASA: III  Anesthesia Plan: General   Post-op Pain Management:    Induction: Intravenous  Airway Management Planned: Oral ETT  Additional Equipment:   Intra-op Plan:   Post-operative Plan: Extubation in OR  Informed Consent: I have reviewed the patients History and Physical, chart, labs and discussed the procedure including the risks, benefits and alternatives for the proposed anesthesia with the patient or authorized representative who has indicated his/her understanding and acceptance.   Dental advisory given  Plan Discussed with: CRNA  Anesthesia Plan Comments:         Anesthesia Quick Evaluation

## 2015-11-04 NOTE — Op Note (Signed)
Date of surgery: 11/04/2015 Preoperative diagnosis: Herniated nucleus pulposus L5-S1 right with severe right lumbar radiculopathy Postoperative diagnosis same Procedure: Microdiscectomy L5-S1 right with operating microscope microdissection technique  Surgeon: Kristeen Miss M.D. Assistant: Francesca Jewett M.D. Anesthesia: Gen. endotracheal Indications: The patient is a 81 year old individual who underwent L2-L4 decompression for severe stenosis approximately 3 years ago. She has had recent worsening of pain in the right leg and was found to have a large extruded fragment of disc at L5-S1 below the vertebral body of the sacrum.  Procedure: The patient was brought to the operating room supine on a stretcher. After the smooth induction of general endotracheal anesthesia patient was carefully turned to the prone position with the bony prominences being appropriately padded and protected. The lower lumbar spine was prepped with alcohol and DuraPrep and then draped in a sterile fashion. The needle was used to localize the area of L5-S1 and a radiograph was obtained demonstrating a needle at the level of L5-S1. Skin was infiltrated with 10 cc of lidocaine 1-100,000 epinephrine mixed 50-50 with half percent Marcaine. An incision was made and carried down to the lumbar dorsal fascia the fascia was opened on the right side of the midline and a subperiosteal dissection was performed in the interlaminar space of L5-S1. A self-retaining retractor was then placed into the wound. The yellow ligament was taken up from the interlaminar space at L5-S1 and the inferior margin of the lamina of L5 was removed and a partial facetectomy was performed at S1. The common dural tube was explored and the take off of the S1 nerve root was identified and gradually mobilized. A massive disc was entered medial to the S1 nerve root and this was removed in a piecemeal fashion under the use of the operating microscope. Gradually the S1 nerve root  could be mobilized medially and underneath the S1 nerve root more disc was encountered. This was removed. The disc space itself was noted to be closed and he could not be entered. The area was decompressed fully of all the disc material that was present. No spinal fluid leaks were encountered. In the end a long nerve hook could be passed easily cephalad and caudally along common dural tube and the path of the S1 nerve root. Hemostasis was achieved. The Vicryl scope was removed the retractor was removed and the lumbar dorsal fascia was closed with #1 Vicryl in interrupted fashion 2-0 Vicryl was used in the subcutaneous tissues and 3-0 Vicryl subcuticularly. 10 mL of half percent Marcaine was injected into the paraspinous fascia. Patient tolerated procedure well. Blood loss was estimated at 50 mL

## 2015-11-04 NOTE — Progress Notes (Signed)
Patient ID: Betty Jackson, female   DOB: Oct 09, 1934, 81 y.o.   MRN: [id number] Vital signs are stable patient is awake alert leg pain is improved Dressing is clean and dry Motor function appears intact Stable

## 2015-11-04 NOTE — Transfer of Care (Signed)
Immediate Anesthesia Transfer of Care Note  Patient: Betty Jackson  Procedure(s) Performed: Procedure(s) with comments: Right Lumbar Five-Sacral One Microdiskectomy (Right) - Right L5-S1 Microdiskectomy  Patient Location: PACU  Anesthesia Type:General  Level of Consciousness: awake, alert , oriented and patient cooperative  Airway & Oxygen Therapy: Patient Spontanous Breathing and Patient connected to nasal cannula oxygen  Post-op Assessment: Report given to RN, Post -op Vital signs reviewed and stable and Patient moving all extremities X 4  Post vital signs: Reviewed and stable  Last Vitals:  Vitals:   11/04/15 1252  BP: (!) 153/53  Pulse: (!) 50  Resp: 20  Temp: 36.7 C    Last Pain:  Vitals:   11/04/15 1252  TempSrc: Oral         Complications: No apparent anesthesia complications

## 2015-11-05 ENCOUNTER — Encounter (HOSPITAL_COMMUNITY): Payer: Self-pay | Admitting: Neurological Surgery

## 2015-11-05 DIAGNOSIS — M5117 Intervertebral disc disorders with radiculopathy, lumbosacral region: Secondary | ICD-10-CM | POA: Diagnosis not present

## 2015-11-05 DIAGNOSIS — F329 Major depressive disorder, single episode, unspecified: Secondary | ICD-10-CM | POA: Diagnosis not present

## 2015-11-05 DIAGNOSIS — M069 Rheumatoid arthritis, unspecified: Secondary | ICD-10-CM | POA: Diagnosis not present

## 2015-11-05 DIAGNOSIS — F419 Anxiety disorder, unspecified: Secondary | ICD-10-CM | POA: Diagnosis not present

## 2015-11-05 DIAGNOSIS — E785 Hyperlipidemia, unspecified: Secondary | ICD-10-CM | POA: Diagnosis not present

## 2015-11-05 DIAGNOSIS — I2510 Atherosclerotic heart disease of native coronary artery without angina pectoris: Secondary | ICD-10-CM | POA: Diagnosis not present

## 2015-11-05 MED ORDER — DIAZEPAM 5 MG PO TABS: 5.0000 mg | ORAL_TABLET | Freq: Four times a day (QID) | ORAL | 0 refills | Status: DC | PRN

## 2015-11-05 MED ORDER — HYDROCODONE-ACETAMINOPHEN 10-325 MG PO TABS: 1.0000 | ORAL_TABLET | Freq: Four times a day (QID) | ORAL | 0 refills | Status: DC | PRN

## 2015-11-05 MED ORDER — DEXAMETHASONE 1 MG PO TABS: ORAL_TABLET | ORAL | 0 refills | Status: DC

## 2015-11-05 NOTE — Progress Notes (Signed)
Patient alert and oriented, mae's well, voiding adequate amount of urine, swallowing without difficulty, no c/o pain. Patient discharged home with family. Script and discharged instructions given to patient. Patient and family stated understanding of d/c instructions given and has an appointment with MD. 

## 2015-11-05 NOTE — Discharge Summary (Signed)
Physician Discharge Summary  Patient ID: Betty Jackson MRN: [id number] DOB/AGE: 08/31/1934 81 y.o.  Admit date: 11/04/2015 Discharge date: 11/05/2015  Admission Diagnoses:Herniated nucleus pulposus L5-S1 with radiculopathy. Rheumatoid arthritis  Discharge Diagnoses: Herniated nucleus pulposus L5-S1 with radiculopathy. Rheumatoid arthritis. Active Problems:   Herniated nucleus pulposus, L5-S1, right   Discharged Condition: good  Hospital Course: Patient was admitted to undergo surgical decompression of a large herniated nucleus pulposus at L5-S1. She tolerated surgery well.  Consults: None  Significant Diagnostic Studies: None  Treatments: surgery: Right side laminotomy and discectomy L5-S1 right with operating microscope microdissection technique.  Discharge Exam: Blood pressure (!) 100/55, pulse (!) 54, temperature 98.4 F (36.9 C), resp. rate 18, weight 76.7 kg (169 lb), SpO2 96 %. Incision is clean and dry motor function is intact in lower extremities.  Disposition: 01-Home or Self Care  Discharge Instructions    Call MD for:  redness, tenderness, or signs of infection (pain, swelling, redness, odor or green/yellow discharge around incision site)    Complete by:  As directed   Call MD for:  severe uncontrolled pain    Complete by:  As directed   Call MD for:  temperature >100.4    Complete by:  As directed   Diet - low sodium heart healthy    Complete by:  As directed   Discharge instructions    Complete by:  As directed   Okay to shower. Do not apply salves or appointments to incision. No heavy lifting with the upper extremities greater than 15 pounds. May resume driving when not requiring pain medication and patient feels comfortable with doing so.   Increase activity slowly    Complete by:  As directed       Medication List    TAKE these medications   amLODipine 5 MG tablet Commonly known as:  NORVASC Take 1 tablet (5 mg total) by mouth daily.   calcium carbonate 600  MG Tabs tablet Commonly known as:  OS-CAL Take 600 mg by mouth daily.   cholecalciferol 1000 units tablet Commonly known as:  VITAMIN D Take 1,000 Units by mouth daily.   clopidogrel 75 MG tablet Commonly known as:  PLAVIX TAKE 1 TABLET BY MOUTH EVERY DAY WITH BREAKFAST   dexamethasone 1 MG tablet Commonly known as:  DECADRON 2 tablets twice daily for 2 days, one tablet twice daily for 2 days, one tablet daily for 2 days.   diazepam 5 MG tablet Commonly known as:  VALIUM Take 1 tablet (5 mg total) by mouth every 6 (six) hours as needed for muscle spasms.   escitalopram 10 MG tablet Commonly known as:  LEXAPRO TAKE 1 TABLET BY MOUTH EVERY DAY   hydrochlorothiazide 12.5 MG capsule Commonly known as:  MICROZIDE TAKE 1 CAPSULE (12.5 MG TOTAL) BY MOUTH DAILY.   HYDROcodone-acetaminophen 10-325 MG tablet Commonly known as:  NORCO Take 1 tablet by mouth every 6 (six) hours as needed. for pain What changed:  Another medication with the same name was added. Make sure you understand how and when to take each.   HYDROcodone-acetaminophen 10-325 MG tablet Commonly known as:  NORCO Take 1-2 tablets by mouth every 6 (six) hours as needed for moderate pain. What changed:  You were already taking a medication with the same name, and this prescription was added. Make sure you understand how and when to take each.   KLOR-CON 10 10 MEQ tablet Generic drug:  potassium chloride TAKE 1 TABLET (10 MEQ TOTAL) BY MOUTH  DAILY.   losartan 100 MG tablet Commonly known as:  COZAAR TAKE 1 TABLET BY MOUTH DAILY   metoprolol 50 MG tablet Commonly known as:  LOPRESSOR TAKE 1 TABLET BY MOUTH TWICE DAILY What changed:  See the new instructions.   OVER THE COUNTER MEDICATION Take 2 tablets by mouth daily. Juice Plus Vegetable   predniSONE 1 MG tablet Commonly known as:  DELTASONE Take 8 mg by mouth daily.   temazepam 15 MG capsule Commonly known as:  RESTORIL TAKE ONE CAPSULE BY MOUTH AT  BEDTIME AS NEEDED FOR SLEEP   vitamin C 500 MG tablet Commonly known as:  ASCORBIC ACID Take 1,000 mg by mouth daily as needed (for sore throat).        SignedEarleen Newport 11/05/2015, 12:28 PM

## 2015-11-05 NOTE — Evaluation (Signed)
Occupational Therapy Evaluation Patient Details Name: Betty Jackson MRN: [id number] DOB: 14-Jun-1934 Today's Date: 11/05/2015    History of Present Illness 81 yo female s/P L 5-s1 microdisectomy PMH: L2-3 decompression L3-4 fusion   Clinical Impression   Patient evaluated by Occupational Therapy with no further acute OT needs identified. All education has been completed and the patient has no further questions. See below for any follow-up Occupational Therapy or equipment needs. OT to sign off. Thank you for referral.      Follow Up Recommendations  No OT follow up    Equipment Recommendations  None recommended by OT    Recommendations for Other Services       Precautions / Restrictions Precautions Precautions: Back Precaution Comments: back handout provided and reviewed in detail      Mobility Bed Mobility Overal bed mobility: Modified Independent             General bed mobility comments: cues to sequence but able to complete   Transfers Overall transfer level: Needs assistance   Transfers: Sit to/from Stand Sit to Stand: Supervision              Balance                                            ADL Overall ADL's : Modified independent   Back handout provided and reviewed adls in detail. Pt educated on: set an alarm at night for medication, avoid sitting for long periods of time, correct bed positioning for sleeping, correct sequence for bed mobility, avoiding lifting more than 5 pounds and never wash directly over incision. All education is complete and patient indicates understanding.                                       General ADL Comments: Pt completed shower transfer, bed mobility and LB dressing. pt able to complete dressing in supine / sitting. pt does have reacher at home. Pt educated on home setup for adls and food      Vision     Perception     Praxis      Pertinent Vitals/Pain Pain Assessment:  Faces Faces Pain Scale: Hurts a little bit Pain Location: back Pain Descriptors / Indicators: Operative site guarding Pain Intervention(s): Monitored during session;Premedicated before session;Repositioned     Hand Dominance Right   Extremity/Trunk Assessment Upper Extremity Assessment Upper Extremity Assessment: Overall WFL for tasks assessed   Lower Extremity Assessment Lower Extremity Assessment: Defer to PT evaluation   Cervical / Trunk Assessment Cervical / Trunk Assessment: Other exceptions (s/p surg )   Communication Communication Communication: No difficulties   Cognition Arousal/Alertness: Awake/alert Behavior During Therapy: WFL for tasks assessed/performed Overall Cognitive Status: Within Functional Limits for tasks assessed                     General Comments       Exercises       Shoulder Instructions      Home Living Family/patient expects to be discharged to:: Private residence Living Arrangements: Alone;Other (Comment) (children PRN) Available Help at Discharge: Family;Available PRN/intermittently Type of Home: House (townhome) Home Access: Level entry     Home Layout: One level     Bathroom Shower/Tub: Walk-in shower  Bathroom Toilet: Standard     Home Equipment: Environmental consultant - 2 wheels;Shower seat;Adaptive equipment;Grab bars - tub/shower Adaptive Equipment: Reacher Additional Comments: pts spouse is currently at [place] at [facility] ( 5 weeks admit) Daughters x2 are rotating helping patient and patients spouse admitted to [facility]. Pt will have 24/7 (A) for first 2 days      Prior Functioning/Environment Level of Independence: Independent             OT Diagnosis: Generalized weakness   OT Problem List:     OT Treatment/Interventions:      OT Goals(Current goals can be found in the care plan section)    OT Frequency:     Barriers to D/C:            Co-evaluation              End of Session Equipment Utilized  During Treatment: Gait belt Nurse Communication: Mobility status;Precautions  Activity Tolerance: Patient tolerated treatment well Patient left: in chair;with call bell/phone within reach;with family/visitor present   Time: [id number] OT Time Calculation (min): 20 min Charges:  OT General Charges $OT Visit: 1 Procedure OT Evaluation $OT Eval Moderate Complexity: 1 Procedure G-Codes: OT G-codes **NOT FOR INPATIENT CLASS** Functional Assessment Tool Used: clinical judgement Functional Limitation: Self care Self Care Current Status [id number]): 0 percent impaired, limited or restricted Self Care Goal Status [id number]): 0 percent impaired, limited or restricted Self Care Discharge Status [id number]): 0 percent impaired, limited or restricted  Parke Poisson B 11/05/2015, 9:06 AM  Jeri Modena   OTR/L Pager: [phone] Office: [phone] .

## 2015-11-05 NOTE — Progress Notes (Signed)
PT Cancellation Note  Patient Details Name: Betty Jackson MRN: [id number] DOB: 1935-02-23   Cancelled Treatment:    Reason Eval/Treat Not Completed: PT screened, no needs identified, will sign off. Pt reports she has been able to mobilize without difficulty. Daughter confirms this. Pt with prior back surgery.    Aden Sek 11/05/2015, 11:57 AM Suanne Marker PT [phone]

## 2015-11-08 ENCOUNTER — Other Ambulatory Visit (INDEPENDENT_AMBULATORY_CARE_PROVIDER_SITE_OTHER): Payer: Medicare Other

## 2015-11-08 DIAGNOSIS — M316 Other giant cell arteritis: Secondary | ICD-10-CM

## 2015-11-08 DIAGNOSIS — Z79899 Other long term (current) drug therapy: Secondary | ICD-10-CM | POA: Diagnosis not present

## 2015-11-08 LAB — SEDIMENTATION RATE: Sed Rate: 10 mm/hr (ref 0–30)

## 2015-11-08 LAB — C-REACTIVE PROTEIN: CRP: 0.6 mg/dL (ref 0.5–20.0)

## 2015-11-13 ENCOUNTER — Telehealth: Payer: Self-pay | Admitting: Cardiovascular Disease

## 2015-11-13 NOTE — Telephone Encounter (Signed)
error 

## 2015-11-27 ENCOUNTER — Other Ambulatory Visit: Payer: Self-pay | Admitting: Family Medicine

## 2015-12-07 ENCOUNTER — Other Ambulatory Visit: Payer: Self-pay | Admitting: Family Medicine

## 2015-12-19 DIAGNOSIS — Z23 Encounter for immunization: Secondary | ICD-10-CM | POA: Diagnosis not present

## 2015-12-26 DIAGNOSIS — Z96642 Presence of left artificial hip joint: Secondary | ICD-10-CM | POA: Diagnosis not present

## 2015-12-26 DIAGNOSIS — M25561 Pain in right knee: Secondary | ICD-10-CM | POA: Diagnosis not present

## 2015-12-26 DIAGNOSIS — Z96641 Presence of right artificial hip joint: Secondary | ICD-10-CM | POA: Diagnosis not present

## 2015-12-26 DIAGNOSIS — M1710 Unilateral primary osteoarthritis, unspecified knee: Secondary | ICD-10-CM | POA: Diagnosis not present

## 2015-12-26 DIAGNOSIS — Z471 Aftercare following joint replacement surgery: Secondary | ICD-10-CM | POA: Diagnosis not present

## 2015-12-27 ENCOUNTER — Other Ambulatory Visit: Payer: Self-pay | Admitting: Family Medicine

## 2015-12-28 ENCOUNTER — Other Ambulatory Visit: Payer: Self-pay | Admitting: Family Medicine

## 2015-12-31 NOTE — Telephone Encounter (Signed)
Last OV 10/25/2015 Last refill 07/01/2015 #30, 5rf Please advise

## 2015-12-31 NOTE — Telephone Encounter (Signed)
Refill with 5 additional refills. 

## 2016-01-01 ENCOUNTER — Ambulatory Visit (INDEPENDENT_AMBULATORY_CARE_PROVIDER_SITE_OTHER): Payer: Medicare Other | Admitting: Family Medicine

## 2016-01-01 VITALS — BP 140/90 | HR 90 | Temp 98.1°F | Ht 64.5 in | Wt 158.0 lb

## 2016-01-01 DIAGNOSIS — I2510 Atherosclerotic heart disease of native coronary artery without angina pectoris: Secondary | ICD-10-CM | POA: Diagnosis not present

## 2016-01-01 DIAGNOSIS — R197 Diarrhea, unspecified: Secondary | ICD-10-CM

## 2016-01-01 DIAGNOSIS — Z79899 Other long term (current) drug therapy: Secondary | ICD-10-CM | POA: Diagnosis not present

## 2016-01-01 DIAGNOSIS — M316 Other giant cell arteritis: Secondary | ICD-10-CM

## 2016-01-01 DIAGNOSIS — F5104 Psychophysiologic insomnia: Secondary | ICD-10-CM

## 2016-01-01 LAB — BASIC METABOLIC PANEL
BUN: 10 mg/dL (ref 6–23)
CO2: 29 mEq/L (ref 19–32)
CREATININE: 0.84 mg/dL (ref 0.40–1.20)
Calcium: 9.5 mg/dL (ref 8.4–10.5)
Chloride: 96 mEq/L (ref 96–112)
GFR: 69.43 mL/min (ref >60.00)
Glucose, Bld: 80 mg/dL (ref 70–99)
Potassium: 3.6 mEq/L (ref 3.5–5.1)
Sodium: 136 mEq/L (ref 135–145)

## 2016-01-01 LAB — SEDIMENTATION RATE: Sed Rate: 10 mm/hr (ref 0–30)

## 2016-01-01 LAB — HIGH SENSITIVITY CRP: CRP HIGH SENSITIVITY: 5.18 mg/L — AB (ref 0.000–5.000)

## 2016-01-01 NOTE — Progress Notes (Signed)
Subjective:     Patient ID: Betty Jackson, female   DOB: [birth year], 81 y.o.   MRN: [id number]  HPI Patient seen with diarrhea somewhat intermittent for the past 2 months but especially the past 4 days. Recent history is that she had recurrent back surgery about 2 months ago. She did have some perioperative antibiotics but none since then. She relates her current diarrhea is about 4 episodes per day and usually after eating. She denies any prior history of IBS. She describes a loose to watery diarrhea nonbloody. No fever. No abdominal pain. No history of C. difficile. She had colonoscopy 2013 which showed some diverticulosis but no polyps. She's not had any nausea or vomiting. Appetite is fair.  Chronic insomnia. Patient would like to come off some medications. She has history of recurrent depression currently on Lexapro but she feels her depression symptoms are stable. She has taken Restoril for chronic insomnia for years would like to discontinue. She had some difficulty sleeping even when taking that.    Past Medical History:  Diagnosis Date  . Anxiety    "more so the last few days" (10/28/2012)  . Anxiety state, unspecified 10/28/2012  . Arthritis     Right hemiarthroplasty after hip fracture 06/2011  . Arthritis    "hands" (10/28/2012)  . Bruises easily    d/t being on prednisone and plavix  . CAD (coronary artery disease)    a. Stent RCA in [facility];  b. Cath approx 2009 - nonobs per pt report.  . Cataract    immature on the left eye  . Chronic lower back pain    scoliosis  . Complication of anesthesia    pt has a very high tolerance to meds  . DDD (degenerative disc disease)   . Depression   . Diverticulitis    hx of  . Diverticulosis   . Enteritis   . Exertional shortness of breath    "every now and then" (10/28/2012)  . Giant cell arteritis ([place])   . Hemorrhoids   . History of scabies   . Hyperlipidemia    takes Lipitor daily  . Hypertension    takes  Amlodipine,Losartan,Metoprolol,and HCTZ daily  . Incontinence of urine   . Insomnia    takes Restoril nightly  . Joint pain   . Migraines    "back in my 20's; none since" (10/28/2012)  . Osteopenia   . Osteoporosis   . PAF (paroxysmal atrial fibrillation) ([place])    a. lone epidode in 2011 according to notes.  . Scoliosis   . Sinus bradycardia    a. on chronic bb  . Temporal arteritis ([place]) 2011   a. followed @ [facility]; potential flareup 10/28/2012/notes 10/28/2012  . Urinary frequency   . Vocal cord dysfunction    "they don't operate properly" (10/28/2012)   Past Surgical History:  Procedure Laterality Date  . ABDOMINAL HYSTERECTOMY  ~ 1984   vaginally  . BACK SURGERY  7-33yrs ago   X Stop  . BLADDER SUSPENSION  2001  . BREAST BIOPSY Right   . CATARACT EXTRACTION W/ INTRAOCULAR LENS IMPLANT Right ~ 08/2012  . COLONOSCOPY  01/26/2012   Procedure: COLONOSCOPY;  Surgeon: Ladene Artist, MD,FACG;  Location: [facility] ENDOSCOPY;  Service: Endoscopy;  Laterality: N/A;  note the EGD is possible  . CORONARY ANGIOPLASTY WITH STENT PLACEMENT  2006   X 1 stent  . EPIDURAL BLOCK INJECTION    . ESOPHAGOGASTRODUODENOSCOPY  01/26/2012   Procedure: ESOPHAGOGASTRODUODENOSCOPY (EGD);  Surgeon: Ladene Artist, MD,FACG;  Location: [facility] ENDOSCOPY;  Service: Endoscopy;  Laterality: N/A;  . HEMIARTHROPLASTY HIP Right 2012  . LAPAROSCOPIC CHOLECYSTECTOMY  2001  . LUMBAR FUSION  03/2013  . LUMBAR LAMINECTOMY/DECOMPRESSION MICRODISCECTOMY Right 11/04/2015   Procedure: Right Lumbar Five-Sacral One Microdiskectomy;  Surgeon: Kristeen Miss, MD;  Location: [place] NEURO ORS;  Service: Neurosurgery;  Laterality: Right;  Right L5-S1 Microdiskectomy  . moles removed that required stiches     one on leg and one on face  . TEMPORAL ARTERY BIOPSY / LIGATION Bilateral 2011  . TONSILLECTOMY AND ADENOIDECTOMY     at age 82  . TUBAL LIGATION  ~ 1982  . X-STOP IMPLANTATION  ~ 2010   "lower back" (10/28/2012)    reports that she has never  smoked. She has never used smokeless tobacco. She reports that she drinks alcohol. She reports that she does not use drugs. family history includes Brain cancer (age of onset: 7) in her mother; Breast cancer (age of onset: 35) in her daughter; Heart attack in her father; Heart disease in her other; Hypertension in her other. Allergies  Allergen Reactions  . Dexilant [Dexlansoprazole] Diarrhea  . Dextromethorphan Rash     Review of Systems  Constitutional: Negative for appetite change, chills and fever.  Respiratory: Negative for shortness of breath.   Cardiovascular: Negative for chest pain.  Gastrointestinal: Positive for diarrhea. Negative for abdominal distention, abdominal pain, blood in stool, constipation, nausea, rectal pain and vomiting.  Genitourinary: Negative for dysuria.  Musculoskeletal: Positive for back pain.  Neurological: Negative for dizziness.       Objective:   Physical Exam  Constitutional: She appears well-developed and well-nourished.  HENT:  Mouth/Throat: Oropharynx is clear and moist.  Cardiovascular: Normal rate and regular rhythm.   Pulmonary/Chest: Effort normal and breath sounds normal. No respiratory distress. She has no wheezes. She has no rales.  Abdominal: Soft. Bowel sounds are normal. She exhibits no distension and no mass. There is no tenderness. There is no rebound and no guarding.       Assessment:     #1 somewhat chronic intermittent diarrhea of 2 months duration. Doubt C. difficile but she did have some antibiotics at the time of her back surgery  #2 chronic insomnia  #3 history of depression currently stable     Plan:     -She will taper off Lexapro-Take one half tablet daily for 2 weeks and then discontinue -Information given on appropriate diet for diarrhea -Check basic metabolic panel and C. difficile PCR -If above negative consider Imodium as needed -Sleep hygiene discussed -Try leaving off Restoril  Eulas Post  MD [facility]

## 2016-01-01 NOTE — Patient Instructions (Signed)

## 2016-01-24 ENCOUNTER — Other Ambulatory Visit: Payer: Self-pay | Admitting: Family Medicine

## 2016-01-27 DIAGNOSIS — M315 Giant cell arteritis with polymyalgia rheumatica: Secondary | ICD-10-CM | POA: Diagnosis not present

## 2016-01-27 DIAGNOSIS — H26493 Other secondary cataract, bilateral: Secondary | ICD-10-CM | POA: Diagnosis not present

## 2016-01-27 DIAGNOSIS — H524 Presbyopia: Secondary | ICD-10-CM | POA: Diagnosis not present

## 2016-01-28 DIAGNOSIS — R404 Transient alteration of awareness: Secondary | ICD-10-CM | POA: Diagnosis not present

## 2016-01-28 DIAGNOSIS — R55 Syncope and collapse: Secondary | ICD-10-CM | POA: Diagnosis not present

## 2016-02-06 ENCOUNTER — Other Ambulatory Visit: Payer: Self-pay | Admitting: Family Medicine

## 2016-02-12 ENCOUNTER — Other Ambulatory Visit (INDEPENDENT_AMBULATORY_CARE_PROVIDER_SITE_OTHER): Payer: Medicare Other

## 2016-02-12 DIAGNOSIS — Z79899 Other long term (current) drug therapy: Secondary | ICD-10-CM | POA: Diagnosis not present

## 2016-02-12 DIAGNOSIS — M316 Other giant cell arteritis: Secondary | ICD-10-CM

## 2016-02-12 LAB — HIGH SENSITIVITY CRP: CRP, High Sensitivity: 2.61 mg/L (ref 0.000–5.000)

## 2016-02-12 LAB — SEDIMENTATION RATE: Sed Rate: 21 mm/hr (ref 0–30)

## 2016-02-27 ENCOUNTER — Other Ambulatory Visit: Payer: Self-pay | Admitting: Family Medicine

## 2016-02-28 ENCOUNTER — Other Ambulatory Visit: Payer: Self-pay | Admitting: Family Medicine

## 2016-03-11 DIAGNOSIS — M5127 Other intervertebral disc displacement, lumbosacral region: Secondary | ICD-10-CM | POA: Diagnosis not present

## 2016-03-13 ENCOUNTER — Other Ambulatory Visit (INDEPENDENT_AMBULATORY_CARE_PROVIDER_SITE_OTHER): Payer: Medicare Other

## 2016-03-13 ENCOUNTER — Other Ambulatory Visit: Payer: Self-pay

## 2016-03-13 DIAGNOSIS — M316 Other giant cell arteritis: Secondary | ICD-10-CM | POA: Diagnosis not present

## 2016-03-13 DIAGNOSIS — Z79899 Other long term (current) drug therapy: Secondary | ICD-10-CM

## 2016-03-13 LAB — HIGH SENSITIVITY CRP: CRP, High Sensitivity: 2.54 mg/L (ref 0.000–5.000)

## 2016-03-13 LAB — SEDIMENTATION RATE: Sed Rate: 18 mm/hr (ref 0–30)

## 2016-03-17 ENCOUNTER — Other Ambulatory Visit: Payer: Self-pay | Admitting: Emergency Medicine

## 2016-03-26 ENCOUNTER — Other Ambulatory Visit: Payer: Self-pay | Admitting: Family Medicine

## 2016-04-06 DIAGNOSIS — M4726 Other spondylosis with radiculopathy, lumbar region: Secondary | ICD-10-CM | POA: Diagnosis not present

## 2016-04-06 DIAGNOSIS — M5416 Radiculopathy, lumbar region: Secondary | ICD-10-CM | POA: Diagnosis not present

## 2016-04-06 DIAGNOSIS — M5136 Other intervertebral disc degeneration, lumbar region: Secondary | ICD-10-CM | POA: Diagnosis not present

## 2016-04-06 DIAGNOSIS — M48061 Spinal stenosis, lumbar region without neurogenic claudication: Secondary | ICD-10-CM | POA: Diagnosis not present

## 2016-04-13 ENCOUNTER — Other Ambulatory Visit (INDEPENDENT_AMBULATORY_CARE_PROVIDER_SITE_OTHER): Payer: Medicare Other

## 2016-04-13 DIAGNOSIS — M316 Other giant cell arteritis: Secondary | ICD-10-CM

## 2016-04-13 DIAGNOSIS — Z79899 Other long term (current) drug therapy: Secondary | ICD-10-CM

## 2016-04-13 LAB — HIGH SENSITIVITY CRP: CRP, High Sensitivity: 0.71 mg/L (ref 0.000–5.000)

## 2016-04-13 LAB — SEDIMENTATION RATE: Sed Rate: 14 mm/hr (ref 0–30)

## 2016-04-14 DIAGNOSIS — M316 Other giant cell arteritis: Secondary | ICD-10-CM | POA: Diagnosis not present

## 2016-04-14 DIAGNOSIS — Z79899 Other long term (current) drug therapy: Secondary | ICD-10-CM | POA: Diagnosis not present

## 2016-04-14 DIAGNOSIS — M810 Age-related osteoporosis without current pathological fracture: Secondary | ICD-10-CM | POA: Diagnosis not present

## 2016-05-01 ENCOUNTER — Telehealth: Payer: Self-pay | Admitting: Cardiovascular Disease

## 2016-05-01 NOTE — Telephone Encounter (Signed)
Patient states that Tuesday night she woke up and her left leg was tingling and felt numb. She states that it felt like it was asleep. She states that soon after her right foot started doing the same. She states that the next day her feet still felt weird, yesterday was better, and today has no symptoms. Patient is concerned that she may have had a mini stroke. The patient states that she did not have any weakness, slurring, or any of the other classic symptoms. She states that she does not have any symptoms at all now but wonders if she should have been seen earlier in the week. Patient was advised to follow up with her PCP or neuro MD. Patient advised to be seen in the ED if she develops stroke-like symptoms. Patient verbalized understanding and is in agreement with this plan.

## 2016-05-01 NOTE — Telephone Encounter (Signed)
New message    Pt is calling asking if she needs to come in and see the doctor, because on Tuesday she did not feel well-out of it, not normal-pt states her legs felt numb. Pt stated her left leg went numb then her right leg. Pt states she thinks she may have had a mini a stroke.   Please advise. Thank you.

## 2016-05-08 ENCOUNTER — Ambulatory Visit (INDEPENDENT_AMBULATORY_CARE_PROVIDER_SITE_OTHER): Payer: Medicare Other | Admitting: Endocrinology

## 2016-05-08 ENCOUNTER — Encounter: Payer: Self-pay | Admitting: Endocrinology

## 2016-05-08 VITALS — BP 122/64 | HR 85 | Ht 64.5 in | Wt 166.0 lb

## 2016-05-08 DIAGNOSIS — F329 Major depressive disorder, single episode, unspecified: Secondary | ICD-10-CM

## 2016-05-08 DIAGNOSIS — M316 Other giant cell arteritis: Secondary | ICD-10-CM

## 2016-05-08 DIAGNOSIS — M810 Age-related osteoporosis without current pathological fracture: Secondary | ICD-10-CM

## 2016-05-08 DIAGNOSIS — F32A Depression, unspecified: Secondary | ICD-10-CM

## 2016-05-08 LAB — TSH: TSH: 0.32 u[IU]/mL — AB (ref 0.35–4.50)

## 2016-05-08 LAB — SEDIMENTATION RATE: Sed Rate: 10 mm/hr (ref 0–30)

## 2016-05-08 LAB — VITAMIN D 25 HYDROXY (VIT D DEFICIENCY, FRACTURES): VITD: 14.5 ng/mL — AB (ref 30.00–100.00)

## 2016-05-08 NOTE — Progress Notes (Signed)
Subjective:    Patient ID: Betty Jackson, female    DOB: 1935/01/10, 82 y.o.   MRN: [id number]  HPI Pt is self-referred, for osteoporosis.  Pt was noted to have osteoporosis in approx 2012.  She has no history of any of the following: early menopause, multiple myeloma, renal dz, thyroid problems, prolonged bedrest, alcoholism, smoking, liver dz, vid-d deficiency, primary hyperparathyroidism.  She does not take heparin or anticonvulsants.  She fx right hip in 2014.  No other bony fracture.  She has been on prednisone since 2012, for temporal arteritis. She has chronic moderate pain at the lower back, but no assoc numbness.  She did not tolerate fosamax (n/v).  She has taken no other medication for osteoporosis.   Past Medical History:  Diagnosis Date  . Anxiety    "more so the last few days" (10/28/2012)  . Anxiety state, unspecified 10/28/2012  . Arthritis     Right hemiarthroplasty after hip fracture 06/2011  . Arthritis    "hands" (10/28/2012)  . Bruises easily    d/t being on prednisone and plavix  . CAD (coronary artery disease)    a. Stent RCA in [facility];  b. Cath approx 2009 - nonobs per pt report.  . Cataract    immature on the left eye  . Chronic lower back pain    scoliosis  . Complication of anesthesia    pt has a very high tolerance to meds  . DDD (degenerative disc disease)   . Depression   . Diverticulitis    hx of  . Diverticulosis   . Enteritis   . Exertional shortness of breath    "every now and then" (10/28/2012)  . Giant cell arteritis ([place])   . Hemorrhoids   . History of scabies   . Hyperlipidemia    takes Lipitor daily  . Hypertension    takes Amlodipine,Losartan,Metoprolol,and HCTZ daily  . Incontinence of urine   . Insomnia    takes Restoril nightly  . Joint pain   . Migraines    "back in my 20's; none since" (10/28/2012)  . Osteopenia   . Osteoporosis   . PAF (paroxysmal atrial fibrillation) ([place])    a. lone epidode in 2011 according to notes.    . Scoliosis   . Sinus bradycardia    a. on chronic bb  . Temporal arteritis ([place]) 2011   a. followed @ [facility]; potential flareup 10/28/2012/notes 10/28/2012  . Urinary frequency   . Vocal cord dysfunction    "they don't operate properly" (10/28/2012)    Past Surgical History:  Procedure Laterality Date  . ABDOMINAL HYSTERECTOMY  ~ 1984   vaginally  . BACK SURGERY  7-32yr ago   X Stop  . BLADDER SUSPENSION  2001  . BREAST BIOPSY Right   . CATARACT EXTRACTION W/ INTRAOCULAR LENS IMPLANT Right ~ 08/2012  . COLONOSCOPY  01/26/2012   Procedure: COLONOSCOPY;  Surgeon: MLadene Artist MD,FACG;  [facility];  Service: Endoscopy;  Laterality: N/A;  note the EGD is possible  . CORONARY ANGIOPLASTY WITH STENT PLACEMENT  2006   X 1 stent  . EPIDURAL BLOCK INJECTION    . ESOPHAGOGASTRODUODENOSCOPY  01/26/2012   Procedure: ESOPHAGOGASTRODUODENOSCOPY (EGD);  Surgeon: MLadene Artist MD,FACG;  [facility];  Service: Endoscopy;  Laterality: N/A;  . HEMIARTHROPLASTY HIP Right 2012  . LAPAROSCOPIC CHOLECYSTECTOMY  2001  . LUMBAR FUSION  03/2013  . LUMBAR LAMINECTOMY/DECOMPRESSION MICRODISCECTOMY Right 11/04/2015   Procedure: Right Lumbar  Five-Sacral One Microdiskectomy;  Surgeon: Kristeen Miss, MD;  Location: [place] [facility];  Service: Neurosurgery;  Laterality: Right;  Right L5-S1 Microdiskectomy  . moles removed that required stiches     one on leg and one on face  . TEMPORAL ARTERY BIOPSY / LIGATION Bilateral 2011  . TONSILLECTOMY AND ADENOIDECTOMY     at age 82  . TUBAL LIGATION  ~ 1982  . X-STOP IMPLANTATION  ~ 2010   "lower back" (10/28/2012)    Social History   Social History  . Marital status: Married    Spouse name: N/A  . Number of children: 3  . Years of education: N/A   Occupational History  . Retired Cabin crew    Social History Main Topics  . Smoking status: Never Smoker  . Smokeless tobacco: Never Used  . Alcohol use 0.0 oz/week     Comment: socially  . Drug  use: No  . Sexual activity: Not on file   Other Topics Concern  . Not on file   Social History Narrative   Lives in [place] with her husband.  Retired Cabin crew.    Current Outpatient Prescriptions on File Prior to Visit  Medication Sig Dispense Refill  . amLODipine (NORVASC) 5 MG tablet TAKE 1 TABLET (5 MG TOTAL) BY MOUTH DAILY. 90 tablet 2  . calcium carbonate (OS-CAL) 600 MG TABS Take 600 mg by mouth daily.    . cholecalciferol (VITAMIN D) 1000 UNITS tablet Take 1,000 Units by mouth daily.     . clopidogrel (PLAVIX) 75 MG tablet TAKE 1 TABLET BY MOUTH EVERY DAY WITH BREAKFAST 30 tablet 5  . hydrochlorothiazide (MICROZIDE) 12.5 MG capsule TAKE ONE CAPSULE BY MOUTH EVERY DAY 30 capsule 2  . HYDROcodone-acetaminophen (NORCO) 10-325 MG tablet Take 1-2 tablets by mouth every 6 (six) hours as needed for moderate pain. 60 tablet 0  . KLOR-CON 10 10 MEQ tablet TAKE 1 TABLET (10 MEQ TOTAL) BY MOUTH DAILY. 90 tablet 3  . losartan (COZAAR) 100 MG tablet TAKE 1 TABLET BY MOUTH EVERY DAY 90 tablet 3  . metoprolol (LOPRESSOR) 50 MG tablet TAKE 1 TABLET BY MOUTH TWICE A DAY 180 tablet 2  . predniSONE (DELTASONE) 1 MG tablet Take 8 mg by mouth daily.   2  . vitamin C (ASCORBIC ACID) 500 MG tablet Take 1,000 mg by mouth daily as needed (for sore throat).     . vitamin E (VITAMIN E) 400 UNIT capsule Take 400 Units by mouth daily.     No current facility-administered medications on file prior to visit.     Allergies  Allergen Reactions  . Dexilant [Dexlansoprazole] Diarrhea  . Dextromethorphan Rash    Family History  Problem Relation Age of Onset  . Brain cancer Mother 27  . Heart attack Father   . Breast cancer Daughter 73  . Heart disease Other   . Hypertension Other   . Arthritis Neg Hx   . Colon cancer Neg Hx   . Osteoporosis Neg Hx     BP 122/64   Pulse 85   Ht 5' 4.5" (1.638 m)   Wt 166 lb (75.3 kg)   SpO2 96%   BMI 28.05 kg/m    Review of Systems denies hematuria,  cold intolerance, edema, heartburn, skin rash, insomnia, falls, cramps, memory loss, upper back pain, and rhinorrhea.  She has weight gain and easy bruising.      Objective:   Physical Exam VS: see vs page GEN: no distress HEAD: head:  no deformity eyes: no periorbital swelling, no proptosis.  external nose and ears are normal.  mouth: no lesion seen.   NECK: supple, thyroid is not enlarged.   Voice is hoarse.   CHEST WALL: no deformity.  No kyphosis, but there is scoliosis. LUNGS: clear to auscultation CV: reg rate and rhythm, no murmur ABD: abdomen is soft, nontender.  no hepatosplenomegaly.  not distended.  no hernia. MUSCULOSKELETAL: muscle bulk and strength are grossly normal.  no obvious joint swelling.  gait is normal and steady EXTEMITIES: no deformity.  no ulcer on the feet.  feet are of normal color and temp.  no edema PULSES: dorsalis pedis intact bilat.  no carotid bruit NEURO:  cn 2-12 grossly intact.   readily moves all 4's.  sensation is intact to touch on the feet SKIN:  Normal texture and temperature.  No rash or suspicious lesion is visible.   NODES:  None palpable at the neck PSYCH: alert, well-oriented.  Does not appear anxious nor depressed.  Lab Results  Component Value Date   CREATININE 0.84 01/01/2016   BUN 10 01/01/2016   NA 136 01/01/2016   K 3.6 01/01/2016   CL 96 01/01/2016   CO2 29 01/01/2016   Radiol: L-spine did not show osteopenia  I have reviewed outside records, and summarized: Pt was seen for depression.  She was on medication that could increse fall risk.  Dr Elease Hashimoto and pt agreed on the need to reduce meds.    Assessment & Plan:  Osteoporosis, as reported by patient Scoliosis causing OA, and the pain, new to me.  This will have to be considered with DEXA.   Patient is advised the following: Patient Instructions  blood tests are requested for you today.  We'll let you know about the results. It is critically important to prevent falling  down (keep floor areas well-lit, dry, and free of loose objects.  If you have a cane, walker, or wheelchair, you should use it, even for short trips around the house.  Wear flat-soled shoes.  Also, try not to rush).   Let's check the bone density test.   If blood is normal, you should consider a once a year infusion called "reclast."

## 2016-05-08 NOTE — Patient Instructions (Addendum)
blood tests are requested for you today.  We'll let you know about the results. It is critically important to prevent falling down (keep floor areas well-lit, dry, and free of loose objects.  If you have a cane, walker, or wheelchair, you should use it, even for short trips around the house.  Wear flat-soled shoes.  Also, try not to rush).   Let's check the bone density test.   If blood is normal, you should consider a once a year infusion called "reclast."

## 2016-05-09 MED ORDER — VITAMIN D (ERGOCALCIFEROL) 1.25 MG (50000 UNIT) PO CAPS: 50000.0000 [IU] | ORAL_CAPSULE | ORAL | 0 refills | Status: AC

## 2016-05-11 LAB — PTH, INTACT AND CALCIUM
Calcium: 8.7 mg/dL (ref 8.6–10.4)
PTH: 47 pg/mL (ref 14–64)

## 2016-05-12 LAB — PROTEIN ELECTROPHORESIS, SERUM
ALPHA-1-GLOBULIN: 0.5 g/dL — AB (ref 0.2–0.3)
ALPHA-2-GLOBULIN: 0.7 g/dL (ref 0.5–0.9)
Abnormal Protein Band1: 0.3 g/dL
Albumin ELP: 3.6 g/dL — ABNORMAL LOW (ref 3.8–4.8)
BETA 2: 0.2 g/dL (ref 0.2–0.5)
Beta Globulin: 0.5 g/dL (ref 0.4–0.6)
GAMMA GLOBULIN: 0.9 g/dL (ref 0.8–1.7)
TOTAL PROTEIN, SERUM ELECTROPHOR: 6.3 g/dL (ref 6.1–8.1)

## 2016-05-14 DIAGNOSIS — M4120 Other idiopathic scoliosis, site unspecified: Secondary | ICD-10-CM | POA: Diagnosis not present

## 2016-05-15 ENCOUNTER — Other Ambulatory Visit: Payer: Self-pay | Admitting: Endocrinology

## 2016-05-22 ENCOUNTER — Inpatient Hospital Stay: Admission: RE | Admit: 2016-05-22 | Payer: Medicare Other | Source: Ambulatory Visit

## 2016-06-20 ENCOUNTER — Other Ambulatory Visit: Payer: Self-pay | Admitting: Family Medicine

## 2016-06-25 ENCOUNTER — Other Ambulatory Visit (INDEPENDENT_AMBULATORY_CARE_PROVIDER_SITE_OTHER): Payer: Medicare Other

## 2016-06-25 DIAGNOSIS — Z79899 Other long term (current) drug therapy: Secondary | ICD-10-CM

## 2016-06-25 DIAGNOSIS — M810 Age-related osteoporosis without current pathological fracture: Secondary | ICD-10-CM | POA: Diagnosis not present

## 2016-06-25 DIAGNOSIS — M316 Other giant cell arteritis: Secondary | ICD-10-CM

## 2016-06-26 LAB — SEDIMENTATION RATE: Sed Rate: 5 mm/hr (ref 0–30)

## 2016-06-26 LAB — HIGH SENSITIVITY CRP: CRP HIGH SENSITIVITY: 1.8 mg/L

## 2016-06-27 ENCOUNTER — Emergency Department (HOSPITAL_BASED_OUTPATIENT_CLINIC_OR_DEPARTMENT_OTHER): Payer: Medicare Other

## 2016-06-27 ENCOUNTER — Encounter (HOSPITAL_BASED_OUTPATIENT_CLINIC_OR_DEPARTMENT_OTHER): Payer: Self-pay | Admitting: Emergency Medicine

## 2016-06-27 ENCOUNTER — Emergency Department (HOSPITAL_BASED_OUTPATIENT_CLINIC_OR_DEPARTMENT_OTHER)
Admission: EM | Admit: 2016-06-27 | Discharge: 2016-06-27 | Disposition: A | Discharge status: Home / Self Care | Payer: Medicare Other | Attending: Emergency Medicine | Admitting: Emergency Medicine

## 2016-06-27 DIAGNOSIS — M545 Low back pain: Secondary | ICD-10-CM | POA: Insufficient documentation

## 2016-06-27 DIAGNOSIS — M25552 Pain in left hip: Secondary | ICD-10-CM | POA: Insufficient documentation

## 2016-06-27 DIAGNOSIS — I2510 Atherosclerotic heart disease of native coronary artery without angina pectoris: Secondary | ICD-10-CM | POA: Diagnosis not present

## 2016-06-27 DIAGNOSIS — Z79899 Other long term (current) drug therapy: Secondary | ICD-10-CM | POA: Insufficient documentation

## 2016-06-27 DIAGNOSIS — I10 Essential (primary) hypertension: Secondary | ICD-10-CM | POA: Diagnosis not present

## 2016-06-27 MED ORDER — HYDROCODONE-ACETAMINOPHEN 10-325 MG PO TABS: 1.0000 | ORAL_TABLET | Freq: Four times a day (QID) | ORAL | 0 refills | Status: DC | PRN

## 2016-06-27 MED ORDER — HYDROCODONE-ACETAMINOPHEN 5-325 MG PO TABS
1.0000 | ORAL_TABLET | Freq: Once | ORAL | Status: AC
  Administered 2016-06-27: 1 via ORAL
  Filled 2016-06-27: qty 1

## 2016-06-27 MED ORDER — HYDROCODONE-ACETAMINOPHEN 5-325 MG PO TABS: 1.0000 | ORAL_TABLET | ORAL | 0 refills | Status: DC | PRN

## 2016-06-27 NOTE — ED Provider Notes (Signed)
[place] DEPT [facility] Provider Note   CSN: [id number] Arrival date & time: 06/27/16  1101     History   Chief Complaint Chief Complaint  Patient presents with  . Hip Pain    HPI Betty Jackson is a 82 y.o. female.  HPI   Patient is a 82 year old female with history of hypertension, hyperlipidemia, degenerative disc disease, arthritis, osteoporosis, chronic low back pain (reports having lumbar surgery performed 4 and 2 years ago), CAD, PAF and temporal arteritis (on plavix) who presents to the ED from home with complaint of left hip pain. Patient reports having chronic intermittent low back and hip pain. She notes over the past 2-3 days she began having worsening of her chronic low back and left hip pain. She reports having an intermittent "catching" sensation in her left hip which she notes typically occurs with movement. Patient denies any recent fall, trauma or injury but notes she has done lots of lifting this week. She notes she has been moving her winter) into storage. Pt denies fever, numbness, tingling, saddle anesthesia, loss of bowel or bladder, weakness, CP, abdominal pain, N/V, diarrhea, constipation, IVDU, cancer or recent spinal manipulation. Patient reports having prescription of hydrocodone at home which she was prescribed by Dr. Ellene Route (neurosurgeon) but notes she ran out of the medication 3 weeks ago. Patient reports typically only taking the pain meds as needed. Denies taking any medications at home for her symptoms today.  Past Medical History:  Diagnosis Date  . Anxiety    "more so the last few days" (10/28/2012)  . Anxiety state, unspecified 10/28/2012  . Arthritis     Right hemiarthroplasty after hip fracture 06/2011  . Arthritis    "hands" (10/28/2012)  . Bruises easily    d/t being on prednisone and plavix  . CAD (coronary artery disease)    a. Stent RCA in [facility];  b. Cath approx 2009 - nonobs per pt report.  . Cataract    immature on the left eye  .  Chronic lower back pain    scoliosis  . Complication of anesthesia    pt has a very high tolerance to meds  . DDD (degenerative disc disease)   . Depression   . Diverticulitis    hx of  . Diverticulosis   . Enteritis   . Exertional shortness of breath    "every now and then" (10/28/2012)  . Giant cell arteritis ([place])   . Hemorrhoids   . History of scabies   . Hyperlipidemia    takes Lipitor daily  . Hypertension    takes Amlodipine,Losartan,Metoprolol,and HCTZ daily  . Incontinence of urine   . Insomnia    takes Restoril nightly  . Joint pain   . Migraines    "back in my 20's; none since" (10/28/2012)  . Osteopenia   . Osteoporosis   . PAF (paroxysmal atrial fibrillation) ([place])    a. lone epidode in 2011 according to notes.  . Scoliosis   . Sinus bradycardia    a. on chronic bb  . Temporal arteritis ([place]) 2011   a. followed @ [facility]; potential flareup 10/28/2012/notes 10/28/2012  . Urinary frequency   . Vocal cord dysfunction    "they don't operate properly" (10/28/2012)    Patient Active Problem List   Diagnosis Date Noted  . Osteoporosis 05/08/2016  . Herniated nucleus pulposus, L5-S1, right 11/04/2015  . Major depressive disorder, recurrent episode, moderate ([place]) 07/16/2013  . Abdominal pain, epigastric 06/22/2013  . Lumbar  stenosis 04/25/2013  . Chronic insomnia 02/07/2013  . Elevated transaminase level 02/07/2013  . CVA (cerebral infarction) 10/29/2012  . Visual changes 10/28/2012  . Depression 10/28/2012  . Anxiety state, unspecified 10/28/2012  . Anemia, unspecified 10/28/2012  . Nonspecific (abnormal) findings on radiological and other examination of gastrointestinal tract 01/25/2012  . Hyponatremia 01/24/2012  . Hematuria 01/24/2012  . Enteritis 12/24/2011  . Abdominal pain, other specified site 12/24/2011  . Leg pain, right 02/18/2011  . Temporal arteritis ([place]) 09/01/2010  . Hypertension 09/01/2010  . Hyperlipidemia 09/01/2010  . History of atrial  fibrillation 09/01/2010  . CAD (coronary artery disease) 09/01/2010  . GERD (gastroesophageal reflux disease) 09/01/2010  . Insomnia 09/01/2010    Past Surgical History:  Procedure Laterality Date  . ABDOMINAL HYSTERECTOMY  ~ 1984   vaginally  . BACK SURGERY  7-11yrs ago   X Stop  . BLADDER SUSPENSION  2001  . BREAST BIOPSY Right   . CATARACT EXTRACTION W/ INTRAOCULAR LENS IMPLANT Right ~ 08/2012  . COLONOSCOPY  01/26/2012   Procedure: COLONOSCOPY;  Surgeon: Ladene Artist, MD,FACG;  Location: [facility] ENDOSCOPY;  Service: Endoscopy;  Laterality: N/A;  note the EGD is possible  . CORONARY ANGIOPLASTY WITH STENT PLACEMENT  2006   X 1 stent  . EPIDURAL BLOCK INJECTION    . ESOPHAGOGASTRODUODENOSCOPY  01/26/2012   Procedure: ESOPHAGOGASTRODUODENOSCOPY (EGD);  Surgeon: Ladene Artist, MD,FACG;  Location[facility]COPY;  Service: Endoscopy;  Laterality: N/A;  . HEMIARTHROPLASTY HIP Right 2012  . LAPAROSCOPIC CHOLECYSTECTOMY  2001  . LUMBAR FUSION  03/2013  . LUMBAR LAMINECTOMY/DECOMPRESSION MICRODISCECTOMY Right 11/04/2015   Procedure: Right Lumbar Five-Sacral One Microdiskectomy;  Surgeon: Kristeen Miss, MD;  Location: [place] NEURO ORS;  Service: Neurosurgery;  Laterality: Right;  Right L5-S1 Microdiskectomy  . moles removed that required stiches     one on leg and one on face  . TEMPORAL ARTERY BIOPSY / LIGATION Bilateral 2011  . TONSILLECTOMY AND ADENOIDECTOMY     at age 32  . TUBAL LIGATION  ~ 1982  . X-STOP IMPLANTATION  ~ 2010   "lower back" (10/28/2012)    OB History    No data available       Home Medications    Prior to Admission medications   Medication Sig Start Date End Date Taking? Authorizing Provider  amLODipine (NORVASC) 5 MG tablet TAKE 1 TABLET (5 MG TOTAL) BY MOUTH DAILY. 12/09/15  Yes Eulas Post, MD  clopidogrel (PLAVIX) 75 MG tablet TAKE 1 TABLET BY MOUTH EVERY DAY WITH BREAKFAST 02/07/16  Yes Eulas Post, MD  escitalopram (LEXAPRO) 10 MG tablet TAKE 1  TABLET BY MOUTH EVERY DAY 06/22/16  Yes Eulas Post, MD  hydrochlorothiazide (MICROZIDE) 12.5 MG capsule TAKE ONE CAPSULE BY MOUTH EVERY DAY 06/22/16  Yes Eulas Post, MD  KLOR-CON 10 10 MEQ tablet TAKE 1 TABLET (10 MEQ TOTAL) BY MOUTH DAILY. 03/02/16  Yes Eulas Post, MD  losartan (COZAAR) 100 MG tablet TAKE 1 TABLET BY MOUTH EVERY DAY 03/02/16  Yes Eulas Post, MD  metoprolol (LOPRESSOR) 50 MG tablet TAKE 1 TABLET BY MOUTH TWICE A DAY 01/24/16  Yes Eulas Post, MD  calcium carbonate (OS-CAL) 600 MG TABS Take 600 mg by mouth daily.    Historical Provider, MD  cholecalciferol (VITAMIN D) 1000 UNITS tablet Take 1,000 Units by mouth daily.     Historical Provider, MD  HYDROcodone-acetaminophen (NORCO) 10-325 MG tablet Take 1 tablet by mouth every 6 (six)  hours as needed. 06/27/16   Nona Dell, PA-C  predniSONE (DELTASONE) 1 MG tablet Take 8 mg by mouth daily.  07/11/14   Historical Provider, MD  vitamin C (ASCORBIC ACID) 500 MG tablet Take 1,000 mg by mouth daily as needed (for sore throat).     Historical Provider, MD  vitamin E (VITAMIN E) 400 UNIT capsule Take 400 Units by mouth daily.    Historical Provider, MD    Family History Family History  Problem Relation Age of Onset  . Brain cancer Mother 47  . Heart attack Father   . Breast cancer Daughter 10  . Heart disease Other   . Hypertension Other   . Arthritis Neg Hx   . Colon cancer Neg Hx   . Osteoporosis Neg Hx     Social History Social History  Substance Use Topics  . Smoking status: Never Smoker  . Smokeless tobacco: Never Used  . Alcohol use 0.0 oz/week     Comment: socially     Allergies   Dexilant [dexlansoprazole] and Dextromethorphan   Review of Systems Review of Systems  Musculoskeletal: Positive for arthralgias (left hip) and back pain.  All other systems reviewed and are negative.    Physical Exam Updated Vital Signs BP (!) 157/78 (BP Location: Right Arm)   Pulse 69    Temp 98.4 F (36.9 C) (Oral)   Resp 18   Ht 5' 4.5" (1.638 m)   Wt 71.7 kg   SpO2 98%   BMI 26.70 kg/m   Physical Exam  Constitutional: She is oriented to person, place, and time. She appears well-developed and well-nourished. No distress.  HENT:  Head: Normocephalic and atraumatic.  Eyes: Conjunctivae and EOM are normal. Right eye exhibits no discharge. Left eye exhibits no discharge. No scleral icterus.  Neck: Normal range of motion. Neck supple.  Cardiovascular: Normal rate, regular rhythm, normal heart sounds and intact distal pulses.   Pulmonary/Chest: Effort normal and breath sounds normal. No respiratory distress. She has no wheezes. She has no rales. She exhibits no tenderness.  Abdominal: Soft. Bowel sounds are normal. She exhibits no distension and no mass. There is no tenderness. There is no rebound and no guarding. No hernia.  Musculoskeletal: Normal range of motion. She exhibits tenderness. She exhibits no edema or deformity.  No midline C, T, or L tenderness. Full range of motion of neck and back but endorses mild pain in lower back. Full range of motion of bilateral upper and lower extremities, with 5/5 strength. TTP over left posterior hip, no swelling, contusion present. Sensation intact. 2+ radial and PT pulses. Negative straight leg raise bilaterally. Cap refill <2 seconds.   Neurological: She is alert and oriented to person, place, and time. No sensory deficit.  Skin: Skin is warm and dry. Capillary refill takes less than 2 seconds. She is not diaphoretic.  Nursing note and vitals reviewed.    ED Treatments / Results  Labs (all labs ordered are listed, but only abnormal results are displayed) Labs Reviewed - No data to display  EKG  EKG Interpretation None       Radiology Dg Hip Unilat W Or Wo Pelvis 2-3 Views Left  Result Date: 06/27/2016 CLINICAL DATA:  Chronic left hip pain that has worsened in the last 2 weeks. EXAM: DG HIP (WITH OR WITHOUT PELVIS)  2-3V LEFT COMPARISON:  Pelvic CT 06/21/2013 FINDINGS: Right hip hemiarthroplasty appears to be grossly intact on this single view. Surgical hardware in the lumbar spine.  There is disc space narrowing and endplate changes at N1-A5. Mild joint space narrowing along the superior aspect of the left hip joint. The left hip is located without a fracture. IMPRESSION: Degenerative changes along the superior left hip joint. No acute bone abnormality. Electronically Signed   By: Markus Daft M.D.   On: 06/27/2016 13:15    Procedures Procedures (including critical care time)  Medications Ordered in ED Medications  HYDROcodone-acetaminophen (NORCO/VICODIN) 5-325 MG per tablet 1 tablet (1 tablet Oral Given 06/27/16 1154)  HYDROcodone-acetaminophen (NORCO/VICODIN) 5-325 MG per tablet 1 tablet (1 tablet Oral Given 06/27/16 1227)     Initial Impression / Assessment and Plan / ED Course  I have reviewed the triage vital signs and the nursing notes.  Pertinent labs & imaging results that were available during my care of the patient were reviewed by me and considered in my medical decision making (see chart for details).     Patient presents with worsening lower back and left hip pain that started 3 days ago after she was lifting and carrying clothes at home during the week. Denies any recent fall, trauma or injury. Patient reports pain is consistent with her typical chronic low back pain but is slightly worse in severity. Reports running out of her home pain meds at home which she only takes as needed. PMH of hypertension, hyperlipidemia, degenerative disc disease, arthritis, osteoporosis, chronic low back pain (reports having lumbar surgery performed 4 and 2 years ago), CAD, PAF and temporal arteritis (on plavix). VSS. Exam revealed TTP over left posterior hip, no swelling, contusion present, FROM of bilateral hips with 5/5 strength. No midline spinal tenderness. Remaining exam unremarkable. Pt given pain meds in the  ED. Left hip xray showed degenerative changes with no acute bone abnormality. Discussed pt with Dr. Winfred Leeds who also evaluated the patient in the ED. Patient reports improvement of symptoms after being given dose of hydrocodone in the ED. Patient able to ambulate without assistance with minimal back pain. Suspect patient's symptoms are likely due to her chronic back pain. Plan to discharge patient home with short prescription of pain meds and advised to follow up with PCP for further management of chronic back pain. Discussed return precautions.  Final Clinical Impressions(s) / ED Diagnoses   Final diagnoses:  Left hip pain    New Prescriptions Discharge Medication List as of 06/27/2016  2:15 PM    START taking these medications   Details  HYDROcodone-acetaminophen (NORCO/VICODIN) 5-325 MG tablet Take 1 tablet by mouth every 4 (four) hours as needed., Starting Sat 06/27/2016, Print         Chesley Noon [place], [place] 06/27/16 [place], MD 06/27/16 [phone]

## 2016-06-27 NOTE — Discharge Instructions (Signed)
Take your medication as prescribed. I also recommend applying ice to affected area for 15-20 minutes 3-4 times daily. Follow-up with your primary care provider within the next 4-5 days for follow-up evaluation. Please return to the Emergency Department if symptoms worsen or new onset of fever, neck/back pain, numbness, tingling, groin anesthesia, loss of bowel or bladder, weakness.

## 2016-06-27 NOTE — ED Notes (Signed)
Patient transported to X-ray 

## 2016-06-27 NOTE — ED Provider Notes (Addendum)
Complains of left hip pain onset 2-3 days ago when she was lifting heavy objects. Pain is nonradiating. Worse with walking. She ran out of her hydrocodone.. She feels much improved since treatment with hydrocodone here. She is able to ambulate without assistance and with minimal pain. back without point tenderness. Bilateral lower extremities neurovascular intact no deformity or swelling. No pain on internal or external rotation of either thigh. [place] Controlled Substance reporting System queried  X-rays viewed by me Orlie Dakin, MD 06/27/16 [place], MD 06/27/16 1414

## 2016-06-27 NOTE — ED Triage Notes (Signed)
Chronic left hip pain and ran out of hydrocodone 10-325mg  x 2 weeks

## 2016-06-30 ENCOUNTER — Encounter: Payer: Self-pay | Admitting: Family Medicine

## 2016-06-30 ENCOUNTER — Ambulatory Visit (INDEPENDENT_AMBULATORY_CARE_PROVIDER_SITE_OTHER): Payer: Medicare Other | Admitting: Family Medicine

## 2016-06-30 VITALS — BP 140/98 | HR 89 | Temp 98.0°F | Wt 163.4 lb

## 2016-06-30 DIAGNOSIS — R300 Dysuria: Secondary | ICD-10-CM

## 2016-06-30 DIAGNOSIS — R319 Hematuria, unspecified: Secondary | ICD-10-CM

## 2016-06-30 LAB — POCT URINALYSIS DIPSTICK
Bilirubin, UA: NEGATIVE
Glucose, UA: NEGATIVE
Ketones, UA: NEGATIVE
PH UA: 6 (ref 5.0–8.0)
SPEC GRAV UA: 1.015 (ref 1.030–1.035)
UROBILINOGEN UA: 0.2 (ref ?–2.0)

## 2016-06-30 MED ORDER — TEMAZEPAM 15 MG PO CAPS: 15.0000 mg | ORAL_CAPSULE | Freq: Every evening | ORAL | 0 refills | Status: DC | PRN

## 2016-06-30 MED ORDER — CEPHALEXIN 500 MG PO CAPS: 500.0000 mg | ORAL_CAPSULE | Freq: Three times a day (TID) | ORAL | 0 refills | Status: DC

## 2016-06-30 NOTE — Progress Notes (Signed)
Subjective:     Patient ID: Betty Jackson, female   DOB: 1935/01/25, 82 y.o.   MRN: [id number]  HPI Patient seen with one-week history of some urine urgency and one episode of some hematuria. She's also noticed her urine is more "cloudy" than usual. She had one day of burning but not consistent burning with urination. No fevers or chills. No flank pain.  Past Medical History:  Diagnosis Date  . Anxiety    "more so the last few days" (10/28/2012)  . Anxiety state, unspecified 10/28/2012  . Arthritis     Right hemiarthroplasty after hip fracture 06/2011  . Arthritis    "hands" (10/28/2012)  . Bruises easily    d/t being on prednisone and plavix  . CAD (coronary artery disease)    a. Stent RCA in [facility];  b. Cath approx 2009 - nonobs per pt report.  . Cataract    immature on the left eye  . Chronic lower back pain    scoliosis  . Complication of anesthesia    pt has a very high tolerance to meds  . DDD (degenerative disc disease)   . Depression   . Diverticulitis    hx of  . Diverticulosis   . Enteritis   . Exertional shortness of breath    "every now and then" (10/28/2012)  . Giant cell arteritis ([place])   . Hemorrhoids   . History of scabies   . Hyperlipidemia    takes Lipitor daily  . Hypertension    takes Amlodipine,Losartan,Metoprolol,and HCTZ daily  . Incontinence of urine   . Insomnia    takes Restoril nightly  . Joint pain   . Migraines    "back in my 20's; none since" (10/28/2012)  . Osteopenia   . Osteoporosis   . PAF (paroxysmal atrial fibrillation) ([place])    a. lone epidode in 2011 according to notes.  . Scoliosis   . Sinus bradycardia    a. on chronic bb  . Temporal arteritis ([place]) 2011   a. followed @ [facility]; potential flareup 10/28/2012/notes 10/28/2012  . Urinary frequency   . Vocal cord dysfunction    "they don't operate properly" (10/28/2012)   Past Surgical History:  Procedure Laterality Date  . ABDOMINAL HYSTERECTOMY  ~ 1984   vaginally  . BACK  SURGERY  7-81yrs ago   X Stop  . BLADDER SUSPENSION  2001  . BREAST BIOPSY Right   . CATARACT EXTRACTION W/ INTRAOCULAR LENS IMPLANT Right ~ 08/2012  . COLONOSCOPY  01/26/2012   Procedure: COLONOSCOPY;  Surgeon: Ladene Artist, MD,FACG;  Location: [facility] ENDOSCOPY;  Service: Endoscopy;  Laterality: N/A;  note the EGD is possible  . CORONARY ANGIOPLASTY WITH STENT PLACEMENT  2006   X 1 stent  . EPIDURAL BLOCK INJECTION    . ESOPHAGOGASTRODUODENOSCOPY  01/26/2012   Procedure: ESOPHAGOGASTRODUODENOSCOPY (EGD);  Surgeon: Ladene Artist, MD,FACG;  Location: [facility] ENDOSCOPY;  Service: Endoscopy;  Laterality: N/A;  . HEMIARTHROPLASTY HIP Right 2012  . LAPAROSCOPIC CHOLECYSTECTOMY  2001  . LUMBAR FUSION  03/2013  . LUMBAR LAMINECTOMY/DECOMPRESSION MICRODISCECTOMY Right 11/04/2015   Procedure: Right Lumbar Five-Sacral One Microdiskectomy;  Surgeon: Kristeen Miss, MD;  Location: [place] NEURO ORS;  Service: Neurosurgery;  Laterality: Right;  Right L5-S1 Microdiskectomy  . moles removed that required stiches     one on leg and one on face  . TEMPORAL ARTERY BIOPSY / LIGATION Bilateral 2011  . TONSILLECTOMY AND ADENOIDECTOMY     at age 38  .  TUBAL LIGATION  ~ 1982  . X-STOP IMPLANTATION  ~ 2010   "lower back" (10/28/2012)    reports that she has never smoked. She has never used smokeless tobacco. She reports that she drinks alcohol. She reports that she does not use drugs. family history includes Brain cancer (age of onset: 39) in her mother; Breast cancer (age of onset: 35) in her daughter; Heart attack in her father; Heart disease in her other; Hypertension in her other. Allergies  Allergen Reactions  . Dexilant [Dexlansoprazole] Diarrhea  . Dextromethorphan Rash     Review of Systems  Constitutional: Negative for appetite change, chills and fever.  Gastrointestinal: Negative for abdominal pain, constipation, diarrhea, nausea and vomiting.  Genitourinary: Positive for dysuria and frequency.   Musculoskeletal: Negative for back pain.  Neurological: Negative for dizziness.       Objective:   Physical Exam  Constitutional: She appears well-developed and well-nourished.  HENT:  Head: Normocephalic and atraumatic.  Neck: Neck supple. No thyromegaly present.  Cardiovascular: Normal rate, regular rhythm and normal heart sounds.   Pulmonary/Chest: Breath sounds normal.  Abdominal: Soft. Bowel sounds are normal. There is no tenderness.       Assessment:     Dysuria. Suspect uncomplicated cystitis. Urine dipstick reveals nitrites, blood, and leukocytes. Patient nontoxic    Plan:     -Drink plenty of fluids -Urine culture sent -Start Keflex 500 mg 3 times a day for 7 days  Eulas Post MD [place] [facility] at [facility]

## 2016-06-30 NOTE — Progress Notes (Signed)
Pre visit review using our clinic review tool, if applicable. No additional management support is needed unless otherwise documented below in the visit note. 

## 2016-06-30 NOTE — Patient Instructions (Signed)

## 2016-07-01 LAB — URINE CULTURE

## 2016-07-02 ENCOUNTER — Encounter: Payer: Self-pay | Admitting: Family Medicine

## 2016-07-03 ENCOUNTER — Telehealth: Payer: Self-pay | Admitting: Family Medicine

## 2016-07-03 NOTE — Telephone Encounter (Signed)
Pt is aware of urine results. Pt would like to know should she continue abx. Pt would like rachel to return her call

## 2016-07-03 NOTE — Telephone Encounter (Signed)
Left message on machine for patient to return our all.  Okay to stop antibiotic per Dr Elease Hashimoto.

## 2016-07-06 NOTE — Telephone Encounter (Signed)
Spoke with patient.  Patient complained of swelling with both feet.  Appointment made.

## 2016-07-08 ENCOUNTER — Encounter: Payer: Self-pay | Admitting: Family Medicine

## 2016-07-08 ENCOUNTER — Ambulatory Visit (INDEPENDENT_AMBULATORY_CARE_PROVIDER_SITE_OTHER): Payer: Medicare Other | Admitting: Family Medicine

## 2016-07-08 VITALS — BP 134/60 | HR 83 | Temp 98.1°F | Ht 64.5 in | Wt 165.0 lb

## 2016-07-08 DIAGNOSIS — R7989 Other specified abnormal findings of blood chemistry: Secondary | ICD-10-CM

## 2016-07-08 DIAGNOSIS — R600 Localized edema: Secondary | ICD-10-CM

## 2016-07-08 DIAGNOSIS — R946 Abnormal results of thyroid function studies: Secondary | ICD-10-CM

## 2016-07-08 LAB — BASIC METABOLIC PANEL
BUN: 18 mg/dL (ref 6–23)
CHLORIDE: 102 meq/L (ref 96–112)
CO2: 28 meq/L (ref 19–32)
CREATININE: 0.84 mg/dL (ref 0.40–1.20)
Calcium: 9.2 mg/dL (ref 8.4–10.5)
GFR: 69.34 mL/min (ref >60.00)
GLUCOSE: 124 mg/dL — AB (ref 70–99)
Potassium: 3.8 mEq/L (ref 3.5–5.1)
Sodium: 139 mEq/L (ref 135–145)

## 2016-07-08 LAB — TSH: TSH: 0.67 u[IU]/mL (ref 0.35–4.50)

## 2016-07-08 LAB — T4, FREE: FREE T4: 0.84 ng/dL (ref 0.60–1.60)

## 2016-07-08 MED ORDER — FUROSEMIDE 20 MG PO TABS: ORAL_TABLET | ORAL | 1 refills | Status: DC

## 2016-07-08 NOTE — Progress Notes (Signed)
Subjective:     Patient ID: Betty Jackson, female   DOB: 1935-02-09, 82 y.o.   MRN: [id number]  HPI Patient seen with approximately one-week history of bilateral leg edema. She just returned from [place]. She states she ate well last week and did not eat a lot of processed foods or high sodium. She's had some gradual increase in edema and edema worse late in the day. She did take a couple of Aleve yesterday but otherwise no nonsteroidals. She denies any dyspnea or orthopnea. She is on low-dose prednisone for temporal arteritis and is being tapered down. He takes low-dose amlodipine 5 mg daily.  Patient has no known history of heart failure. She had echocardiogram over 3 years ago with ejection fraction 65-70%. She is on low-dose hydrochlorothiazide.  Past Medical History:  Diagnosis Date  . Anxiety    "more so the last few days" (10/28/2012)  . Anxiety state, unspecified 10/28/2012  . Arthritis     Right hemiarthroplasty after hip fracture 06/2011  . Arthritis    "hands" (10/28/2012)  . Bruises easily    d/t being on prednisone and plavix  . CAD (coronary artery disease)    a. Stent RCA in [facility];  b. Cath approx 2009 - nonobs per pt report.  . Cataract    immature on the left eye  . Chronic lower back pain    scoliosis  . Complication of anesthesia    pt has a very high tolerance to meds  . DDD (degenerative disc disease)   . Depression   . Diverticulitis    hx of  . Diverticulosis   . Enteritis   . Exertional shortness of breath    "every now and then" (10/28/2012)  . Giant cell arteritis ([place])   . Hemorrhoids   . History of scabies   . Hyperlipidemia    takes Lipitor daily  . Hypertension    takes Amlodipine,Losartan,Metoprolol,and HCTZ daily  . Incontinence of urine   . Insomnia    takes Restoril nightly  . Joint pain   . Migraines    "back in my 20's; none since" (10/28/2012)  . Osteopenia   . Osteoporosis   . PAF (paroxysmal atrial fibrillation) ([place])    a. lone  epidode in 2011 according to notes.  . Scoliosis   . Sinus bradycardia    a. on chronic bb  . Temporal arteritis ([place]) 2011   a. followed @ [facility]; potential flareup 10/28/2012/notes 10/28/2012  . Urinary frequency   . Vocal cord dysfunction    "they don't operate properly" (10/28/2012)   Past Surgical History:  Procedure Laterality Date  . ABDOMINAL HYSTERECTOMY  ~ 1984   vaginally  . BACK SURGERY  7-39yrs ago   X Stop  . BLADDER SUSPENSION  2001  . BREAST BIOPSY Right   . CATARACT EXTRACTION W/ INTRAOCULAR LENS IMPLANT Right ~ 08/2012  . COLONOSCOPY  01/26/2012   Procedure: COLONOSCOPY;  Surgeon: Ladene Artist, MD,FACG;  Location: [facility] ENDOSCOPY;  Service: Endoscopy;  Laterality: N/A;  note the EGD is possible  . CORONARY ANGIOPLASTY WITH STENT PLACEMENT  2006   X 1 stent  . EPIDURAL BLOCK INJECTION    . ESOPHAGOGASTRODUODENOSCOPY  01/26/2012   Procedure: ESOPHAGOGASTRODUODENOSCOPY (EGD);  Surgeon: Ladene Artist, MD,FACG;  Location: [facility] ENDOSCOPY;  Service: Endoscopy;  Laterality: N/A;  . HEMIARTHROPLASTY HIP Right 2012  . LAPAROSCOPIC CHOLECYSTECTOMY  2001  . LUMBAR FUSION  03/2013  . LUMBAR LAMINECTOMY/DECOMPRESSION MICRODISCECTOMY Right 11/04/2015  Procedure: Right Lumbar Five-Sacral One Microdiskectomy;  Surgeon: Kristeen Miss, MD;  Location: [place] NEURO ORS;  Service: Neurosurgery;  Laterality: Right;  Right L5-S1 Microdiskectomy  . moles removed that required stiches     one on leg and one on face  . TEMPORAL ARTERY BIOPSY / LIGATION Bilateral 2011  . TONSILLECTOMY AND ADENOIDECTOMY     at age 18  . TUBAL LIGATION  ~ 1982  . X-STOP IMPLANTATION  ~ 2010   "lower back" (10/28/2012)    reports that she has never smoked. She has never used smokeless tobacco. She reports that she drinks alcohol. She reports that she does not use drugs. family history includes Brain cancer (age of onset: 42) in her mother; Breast cancer (age of onset: 32) in her daughter; Heart attack in her father; Heart  disease in her other; Hypertension in her other. Allergies  Allergen Reactions  . Dexilant [Dexlansoprazole] Diarrhea  . Dextromethorphan Rash    Review of Systems  Constitutional: Negative for fatigue.  Respiratory: Negative for cough and shortness of breath.   Cardiovascular: Positive for leg swelling. Negative for chest pain and palpitations.       Objective:   Physical Exam  Constitutional: She appears well-developed and well-nourished.  Cardiovascular: Normal rate and regular rhythm.   Pulmonary/Chest: Effort normal and breath sounds normal. No respiratory distress. She has no wheezes. She has no rales.  Musculoskeletal: She exhibits edema.  Patient has trace pitting edema both feet and mild nonpitting edema lower legs bilaterally       Assessment:     #1 bilateral leg edema. Symptoms are relatively mild and possibly exacerbated by recent travels. She has no history of known heart failure.  #2 recent abnormal TSH which was slightly low at 0.32    Plan:     -Elevate legs frequently -Check labs with basic metabolic panel, TSH, free T4 -Short-term only low-dose furosemide 20 mg daily for 3 days then once daily as needed -Reassess in 2 weeks and sooner as needed  Eulas Post MD [place] [facility] at [facility]

## 2016-07-08 NOTE — Progress Notes (Signed)
Pre visit review using our clinic review tool, if applicable. No additional management support is needed unless otherwise documented below in the visit note. 

## 2016-07-14 ENCOUNTER — Other Ambulatory Visit: Payer: Self-pay | Admitting: Family Medicine

## 2016-07-22 ENCOUNTER — Encounter: Payer: Self-pay | Admitting: Family Medicine

## 2016-07-22 ENCOUNTER — Ambulatory Visit (INDEPENDENT_AMBULATORY_CARE_PROVIDER_SITE_OTHER): Payer: Medicare Other | Admitting: Family Medicine

## 2016-07-22 VITALS — BP 136/74 | HR 79 | Temp 97.9°F | Wt 159.0 lb

## 2016-07-22 DIAGNOSIS — I959 Hypotension, unspecified: Secondary | ICD-10-CM

## 2016-07-22 DIAGNOSIS — R42 Dizziness and giddiness: Secondary | ICD-10-CM | POA: Diagnosis not present

## 2016-07-22 DIAGNOSIS — A084 Viral intestinal infection, unspecified: Secondary | ICD-10-CM

## 2016-07-22 DIAGNOSIS — E860 Dehydration: Secondary | ICD-10-CM | POA: Diagnosis not present

## 2016-07-22 MED ORDER — ONDANSETRON 4 MG PO TBDP: 4.0000 mg | ORAL_TABLET | Freq: Three times a day (TID) | ORAL | 0 refills | Status: DC | PRN

## 2016-07-22 MED ORDER — SODIUM CHLORIDE 0.9 % IV SOLN: Freq: Once | INTRAVENOUS | Status: DC

## 2016-07-22 NOTE — Patient Instructions (Signed)
Leave off the Losartan for tonight Stay well hydrated  Follow up for any recurrent nausea or vomiting.

## 2016-07-22 NOTE — Progress Notes (Signed)
Pre visit review using our clinic review tool, if applicable. No additional management support is needed unless otherwise documented below in the visit note. 

## 2016-07-22 NOTE — Progress Notes (Signed)
Subjective:     Patient ID: Betty Jackson, female   DOB: Jun 07, 1934, 82 y.o.   MRN: [id number]  HPI Patient is here for follow-up regarding recent peripheral edema. She was placed on furosemide 20 mg daily for 3 days and her edema improved. Her edema had increased following recent travel. She's not had any dyspnea or orthopnea. She developed acute illness yesterday with nausea and vomiting and loose stools 3. No bloody stools.  She had 3 episodes of vomiting yesterday but none today. She still some nausea today and feels very lightheaded. She was able to drive here. Having difficulty transferring from chair to table secondary to dizziness.  BP initially seated 100/60.  She denies abdominal pain. No fevers or chills. No sick contacts. She took all of her regular blood pressure medications last night. No syncope.  Recent weight 165 pounds down to 159 pounds today.  Past Medical History:  Diagnosis Date  . Anxiety    "more so the last few days" (10/28/2012)  . Anxiety state, unspecified 10/28/2012  . Arthritis     Right hemiarthroplasty after hip fracture 06/2011  . Arthritis    "hands" (10/28/2012)  . Bruises easily    d/t being on prednisone and plavix  . CAD (coronary artery disease)    a. Stent RCA in [facility]  b. Cath approx 2009 - nonobs per pt report.  . Cataract    immature on the left eye  . Chronic lower back pain    scoliosis  . Complication of anesthesia    pt has a very high tolerance to meds  . DDD (degenerative disc disease)   . Depression   . Diverticulitis    hx of  . Diverticulosis   . Enteritis   . Exertional shortness of breath    "every now and then" (10/28/2012)  . Giant cell arteritis ([place])   . Hemorrhoids   . History of scabies   . Hyperlipidemia    takes Lipitor daily  . Hypertension    takes Amlodipine,Losartan,Metoprolol,and HCTZ daily  . Incontinence of urine   . Insomnia    takes Restoril nightly  . Joint pain   . Migraines    "back in my 20's;  none since" (10/28/2012)  . Osteopenia   . Osteoporosis   . PAF (paroxysmal atrial fibrillation) ([place])    a. lone epidode in 2011 according to notes.  . Scoliosis   . Sinus bradycardia    a. on chronic bb  . Temporal arteritis ([place]) 2011   a. followed @ [facility]; potential flareup 10/28/2012/notes 10/28/2012  . Urinary frequency   . Vocal cord dysfunction    "they don't operate properly" (10/28/2012)   Past Surgical History:  Procedure Laterality Date  . ABDOMINAL HYSTERECTOMY  ~ 1984   vaginally  . BACK SURGERY  7-48yrs ago   X Stop  . BLADDER SUSPENSION  2001  . BREAST BIOPSY Right   . CATARACT EXTRACTION W/ INTRAOCULAR LENS IMPLANT Right ~ 08/2012  . COLONOSCOPY  01/26/2012   Procedure: COLONOSCOPY;  Surgeon: Ladene Artist, MD,FACG;  Location: [facility] ENDOSCOPY;  Service: Endoscopy;  Laterality: N/A;  note the EGD is possible  . CORONARY ANGIOPLASTY WITH STENT PLACEMENT  2006   X 1 stent  . EPIDURAL BLOCK INJECTION    . ESOPHAGOGASTRODUODENOSCOPY  01/26/2012   Procedure: ESOPHAGOGASTRODUODENOSCOPY (EGD);  Surgeon: Ladene Artist, MD,FACG;  Location: [facility] ENDOSCOPY;  Service: Endoscopy;  Laterality: N/A;  . HEMIARTHROPLASTY HIP Right 2012  .  LAPAROSCOPIC CHOLECYSTECTOMY  2001  . LUMBAR FUSION  03/2013  . LUMBAR LAMINECTOMY/DECOMPRESSION MICRODISCECTOMY Right 11/04/2015   Procedure: Right Lumbar Five-Sacral One Microdiskectomy;  Surgeon: Kristeen Miss, MD;  Location: [place] NEURO ORS;  Service: Neurosurgery;  Laterality: Right;  Right L5-S1 Microdiskectomy  . moles removed that required stiches     one on leg and one on face  . TEMPORAL ARTERY BIOPSY / LIGATION Bilateral 2011  . TONSILLECTOMY AND ADENOIDECTOMY     at age 62  . TUBAL LIGATION  ~ 1982  . X-STOP IMPLANTATION  ~ 2010   "lower back" (10/28/2012)    reports that she has never smoked. She has never used smokeless tobacco. She reports that she drinks alcohol. She reports that she does not use drugs. family history includes Brain cancer  (age of onset: 32) in her mother; Breast cancer (age of onset: 69) in her daughter; Heart attack in her father; Heart disease in her other; Hypertension in her other. Allergies  Allergen Reactions  . Dextromethorphan Rash     Review of Systems  Constitutional: Negative for fatigue.  Eyes: Negative for visual disturbance.  Respiratory: Negative for cough, chest tightness, shortness of breath and wheezing.   Cardiovascular: Negative for chest pain, palpitations and leg swelling.  Gastrointestinal: Positive for nausea. Negative for abdominal pain and blood in stool.  Neurological: Positive for dizziness, weakness and light-headedness. Negative for seizures, syncope and headaches.       Objective:   Physical Exam  Constitutional:  Patient somewhat pale in appearance washed out looking but alert and in no distress  HENT:  Oropharynx slightly dry  Cardiovascular: Normal rate.   Pulmonary/Chest: Effort normal and breath sounds normal. No respiratory distress. She has no wheezes. She has no rales.  Abdominal: Soft. Bowel sounds are normal. She exhibits no mass. There is no tenderness. There is no rebound and no guarding.  Musculoskeletal: She exhibits no edema.       Assessment:     #1 recent peripheral edema resolved  #2 probable viral gastroenteritis and dehydration-improved after IVFs.    Plan:     -We'll try to get IV access and give 1 L of normal saline -Patient received 1 L of normal saline and felt tremendously better afterwards with resolution of nausea and dizziness. Blood pressures following IV fluids: Lying 138/72, sitting 136/74, standing 136/74 -We'll have her hold losartan for tonight -Bland diet and advance as tolerated -Zofran 4 mg every 8 hours as needed for nausea and vomiting  Eulas Post MD [place] [facility] at [facility]

## 2016-07-22 NOTE — Progress Notes (Signed)
Per Dr. Elease Hashimoto, started #22 G IV to RT [facility] using clean technique; 1 attempt needed, patient tolerated well and blood return noted upon flushing. Began NS 0.9% bolus at 0900; infusion ended at 1100; IVF given d/t s/s of dehydration. Patient tolerated IVF without complications and reported "feeling better" post infusion. Peripheral IV was removed post infusion, catheter in tact upon removal and pressure applied to site, site covered with gauze and secured with tape. Patient instructed to monitor site for bleeding and to provide additional pressure/bandage if needed.

## 2016-07-28 ENCOUNTER — Other Ambulatory Visit (INDEPENDENT_AMBULATORY_CARE_PROVIDER_SITE_OTHER): Payer: Medicare Other

## 2016-07-28 DIAGNOSIS — Z79899 Other long term (current) drug therapy: Secondary | ICD-10-CM | POA: Diagnosis not present

## 2016-07-28 DIAGNOSIS — M316 Other giant cell arteritis: Secondary | ICD-10-CM

## 2016-07-28 LAB — SEDIMENTATION RATE: SED RATE: 12 mm/h (ref 0–30)

## 2016-07-28 LAB — HIGH SENSITIVITY CRP: CRP HIGH SENSITIVITY: 2.33 mg/L (ref 0.000–5.000)

## 2016-07-29 ENCOUNTER — Other Ambulatory Visit: Payer: Self-pay | Admitting: Family Medicine

## 2016-07-29 DIAGNOSIS — M4120 Other idiopathic scoliosis, site unspecified: Secondary | ICD-10-CM | POA: Diagnosis not present

## 2016-07-29 NOTE — Telephone Encounter (Signed)
Pt notified me recently that she had tapered off??  Would like for her to try to remain off this.

## 2016-07-30 NOTE — Telephone Encounter (Signed)
Refill #15 only.

## 2016-07-30 NOTE — Telephone Encounter (Signed)
I called the pt and informed her of the message below and she stated her back doctor put her on a heavy dose of Prednisone and she cannot sleep at night, which is why she started this again.  Message sent to Dr Elease Hashimoto.

## 2016-07-30 NOTE — Telephone Encounter (Signed)
I called the pt and informed her the message below and she is aware the Rx was sent to her pharmacy.

## 2016-07-31 ENCOUNTER — Encounter: Payer: Self-pay | Admitting: Family Medicine

## 2016-07-31 ENCOUNTER — Ambulatory Visit (INDEPENDENT_AMBULATORY_CARE_PROVIDER_SITE_OTHER): Payer: Medicare Other | Admitting: Family Medicine

## 2016-07-31 VITALS — BP 148/70 | HR 72 | Temp 98.2°F | Wt 163.6 lb

## 2016-07-31 DIAGNOSIS — F19982 Other psychoactive substance use, unspecified with psychoactive substance-induced sleep disorder: Secondary | ICD-10-CM

## 2016-07-31 DIAGNOSIS — I10 Essential (primary) hypertension: Secondary | ICD-10-CM | POA: Diagnosis not present

## 2016-07-31 DIAGNOSIS — R319 Hematuria, unspecified: Secondary | ICD-10-CM

## 2016-07-31 LAB — URINALYSIS, ROUTINE W REFLEX MICROSCOPIC
BILIRUBIN URINE: NEGATIVE
KETONES UR: NEGATIVE
LEUKOCYTES UA: NEGATIVE
NITRITE: NEGATIVE
Specific Gravity, Urine: 1.015 (ref 1.000–1.030)
Total Protein, Urine: NEGATIVE
UROBILINOGEN UA: 0.2 (ref 0.0–1.0)
Urine Glucose: NEGATIVE
pH: 6 (ref 5.0–8.0)

## 2016-07-31 LAB — POCT URINALYSIS DIPSTICK
BILIRUBIN UA: NEGATIVE
GLUCOSE UA: NEGATIVE
KETONES UA: NEGATIVE
LEUKOCYTES UA: NEGATIVE
NITRITE UA: NEGATIVE
Protein, UA: NEGATIVE
Spec Grav, UA: 1.01 (ref 1.010–1.025)
Urobilinogen, UA: 0.2 E.U./dL
pH, UA: 6 (ref 5.0–8.0)

## 2016-07-31 NOTE — Progress Notes (Signed)
Subjective:     Patient ID: Betty Jackson, female   DOB: Oct 20, 1934, 82 y.o.   MRN: [id number]  HPI Patient seen for follow-up. Last week she had probable viral gastroenteritis with vomiting and dehydration. We gave her one liter of IV fluids (normal saline) here and she improved very quickly. She's had no recurrent nausea or vomiting. She is currently taking very high-dose prednisone per neurosurgeon 60 mg daily-and tapering. She called back requesting refill of Restoril because of some insomnia related to high-dose prednisone  Hypertension treated with multidrug regimen. She had recent electrolytes early April which were stable. Compliant with all medications. No dizziness. No headaches. No chest pains.  Recent dysuria. She had pyuria and hematuria and culture came back negative. She had one episode recently of some minimal blood grossly in her urine. She denies any perineal irritation. Denies any current burning with urination. No flank pain. No appetite or weight changes.  Past Medical History:  Diagnosis Date  . Anxiety    "more so the last few days" (10/28/2012)  . Anxiety state, unspecified 10/28/2012  . Arthritis     Right hemiarthroplasty after hip fracture 06/2011  . Arthritis    "hands" (10/28/2012)  . Bruises easily    d/t being on prednisone and plavix  . CAD (coronary artery disease)    a. Stent RCA in [facility];  b. Cath approx 2009 - nonobs per pt report.  . Cataract    immature on the left eye  . Chronic lower back pain    scoliosis  . Complication of anesthesia    pt has a very high tolerance to meds  . DDD (degenerative disc disease)   . Depression   . Diverticulitis    hx of  . Diverticulosis   . Enteritis   . Exertional shortness of breath    "every now and then" (10/28/2012)  . Giant cell arteritis ([place])   . Hemorrhoids   . History of scabies   . Hyperlipidemia    takes Lipitor daily  . Hypertension    takes Amlodipine,Losartan,Metoprolol,and HCTZ daily   . Incontinence of urine   . Insomnia    takes Restoril nightly  . Joint pain   . Migraines    "back in my 20's; none since" (10/28/2012)  . Osteopenia   . Osteoporosis   . PAF (paroxysmal atrial fibrillation) ([place])    a. lone epidode in 2011 according to notes.  . Scoliosis   . Sinus bradycardia    a. on chronic bb  . Temporal arteritis ([place]) 2011   a. followed @ [facility]; potential flareup 10/28/2012/notes 10/28/2012  . Urinary frequency   . Vocal cord dysfunction    "they don't operate properly" (10/28/2012)   Past Surgical History:  Procedure Laterality Date  . ABDOMINAL HYSTERECTOMY  ~ 1984   vaginally  . BACK SURGERY  7-87yrs ago   X Stop  . BLADDER SUSPENSION  2001  . BREAST BIOPSY Right   . CATARACT EXTRACTION W/ INTRAOCULAR LENS IMPLANT Right ~ 08/2012  . COLONOSCOPY  01/26/2012   Procedure: COLONOSCOPY;  Surgeon: Ladene Artist, MD,FACG;  Location: [facility] ENDOSCOPY;  Service: Endoscopy;  Laterality: N/A;  note the EGD is possible  . CORONARY ANGIOPLASTY WITH STENT PLACEMENT  2006   X 1 stent  . EPIDURAL BLOCK INJECTION    . ESOPHAGOGASTRODUODENOSCOPY  01/26/2012   Procedure: ESOPHAGOGASTRODUODENOSCOPY (EGD);  Surgeon: Ladene Artist, MD,FACG;  Location: [facility] ENDOSCOPY;  Service: Endoscopy;  Laterality: N/A;  .  HEMIARTHROPLASTY HIP Right 2012  . LAPAROSCOPIC CHOLECYSTECTOMY  2001  . LUMBAR FUSION  03/2013  . LUMBAR LAMINECTOMY/DECOMPRESSION MICRODISCECTOMY Right 11/04/2015   Procedure: Right Lumbar Five-Sacral One Microdiskectomy;  Surgeon: Kristeen Miss, MD;  Location: [place] NEURO ORS;  Service: Neurosurgery;  Laterality: Right;  Right L5-S1 Microdiskectomy  . moles removed that required stiches     one on leg and one on face  . TEMPORAL ARTERY BIOPSY / LIGATION Bilateral 2011  . TONSILLECTOMY AND ADENOIDECTOMY     at age 79  . TUBAL LIGATION  ~ 1982  . X-STOP IMPLANTATION  ~ 2010   "lower back" (10/28/2012)    reports that she has never smoked. She has never used smokeless tobacco.  She reports that she drinks alcohol. She reports that she does not use drugs. family history includes Brain cancer (age of onset: 23) in her mother; Breast cancer (age of onset: 84) in her daughter; Heart attack in her father; Heart disease in her other; Hypertension in her other. Allergies  Allergen Reactions  . Dextromethorphan Rash     Review of Systems  Constitutional: Negative for chills and fever.  Respiratory: Negative for cough and shortness of breath.   Cardiovascular: Negative for chest pain and leg swelling.  Gastrointestinal: Negative for abdominal pain, diarrhea, nausea and vomiting.  Genitourinary: Positive for hematuria. Negative for difficulty urinating, dysuria, flank pain, genital sores, pelvic pain and vaginal pain.  Musculoskeletal: Positive for back pain.       Objective:   Physical Exam  Constitutional: She appears well-developed and well-nourished.  HENT:  Mouth/Throat: Oropharynx is clear and moist.  Neck: Neck supple. No thyromegaly present.  Cardiovascular: Normal rate and regular rhythm.   Pulmonary/Chest: Effort normal and breath sounds normal. No respiratory distress. She has no wheezes. She has no rales.  Musculoskeletal: She exhibits no edema.       Assessment:     #1 recent viral gastroenteritis resolved  #2 hypertension stable   #3 reported singular episode of possible gross hematuria  #4 insomnia related to increased prednisone dosage    Plan:     -Recheck urine dipstick today and if positive for blood sent for urine microscopy -Infrequent use of Restoril as needed for severe insomnia -Continue current blood pressure medications. Routine follow-up in 3 months  Eulas Post MD [place] [facility] at [facility]

## 2016-07-31 NOTE — Progress Notes (Signed)
Pre visit review using our clinic review tool, if applicable. No additional management support is needed unless otherwise documented below in the visit note. 

## 2016-08-10 DIAGNOSIS — M5127 Other intervertebral disc displacement, lumbosacral region: Secondary | ICD-10-CM | POA: Diagnosis not present

## 2016-08-11 DIAGNOSIS — M316 Other giant cell arteritis: Secondary | ICD-10-CM | POA: Diagnosis not present

## 2016-08-11 DIAGNOSIS — Z79899 Other long term (current) drug therapy: Secondary | ICD-10-CM | POA: Diagnosis not present

## 2016-08-13 DIAGNOSIS — M545 Low back pain: Secondary | ICD-10-CM | POA: Diagnosis not present

## 2016-08-13 DIAGNOSIS — M4120 Other idiopathic scoliosis, site unspecified: Secondary | ICD-10-CM | POA: Diagnosis not present

## 2016-08-19 ENCOUNTER — Other Ambulatory Visit: Payer: Self-pay | Admitting: Family Medicine

## 2016-08-19 DIAGNOSIS — M5126 Other intervertebral disc displacement, lumbar region: Secondary | ICD-10-CM | POA: Diagnosis not present

## 2016-08-19 NOTE — Telephone Encounter (Signed)
Advise against regular use.  Have discussed in past.

## 2016-08-31 ENCOUNTER — Ambulatory Visit (INDEPENDENT_AMBULATORY_CARE_PROVIDER_SITE_OTHER): Payer: Medicare Other | Admitting: Cardiovascular Disease

## 2016-08-31 ENCOUNTER — Other Ambulatory Visit: Payer: Self-pay | Admitting: Family Medicine

## 2016-08-31 ENCOUNTER — Encounter: Payer: Self-pay | Admitting: Cardiovascular Disease

## 2016-08-31 VITALS — BP 128/70 | HR 69 | Ht 64.5 in | Wt 164.0 lb

## 2016-08-31 DIAGNOSIS — I2510 Atherosclerotic heart disease of native coronary artery without angina pectoris: Secondary | ICD-10-CM

## 2016-08-31 DIAGNOSIS — E7800 Pure hypercholesterolemia, unspecified: Secondary | ICD-10-CM | POA: Diagnosis not present

## 2016-08-31 DIAGNOSIS — Z01810 Encounter for preprocedural cardiovascular examination: Secondary | ICD-10-CM

## 2016-08-31 DIAGNOSIS — I10 Essential (primary) hypertension: Secondary | ICD-10-CM

## 2016-08-31 MED ORDER — DIAZEPAM 5 MG PO TABS: 5.0000 mg | ORAL_TABLET | Freq: Four times a day (QID) | ORAL | 0 refills | Status: DC | PRN

## 2016-08-31 NOTE — Patient Instructions (Addendum)
Medication Instructions:  Your physician recommends that you continue on your current medications as directed. Please refer to the Current Medication list given to you today.   Labwork: none  Testing/Procedures: none  Follow-Up: Your physician recommends that you schedule a follow-up appointment in: 3 months. --Scheduled for September 17,2018 at 2:40    Any Other Special Instructions Will Be Listed Below (If Applicable).     If you need a refill on your cardiac medications before your next appointment, please call your pharmacy.

## 2016-08-31 NOTE — Progress Notes (Signed)
Chief Complaint  Patient presents with  . Follow-up    CAD   History of Present Illness: 82 yo female with history of CAD s/p stent RCA, HTN, HLD, GERD, temporal arteritis here today for cardiac follow up. She had a stent placed in the RCA in [place], [place] in 2005. She moved to [facility] for treatment of her temporal arteritis at [facility]. She had normal ABI in 2012. She had a possible CVA in 2014 and was placed on Plavix but later it was felt that this could have been due to her temporal arteritis. Mild carotid disease by dopplers 2014. Cardiac cath 12/09/12 with patent RCA stent, mild disease LAD and Circumflex, normal filling pressures. Echo August 2014 with normal LV size and function, no significant valve issues. She was seen in our office 06/19/14 with c/o chest pain. Stress myoview 06/29/14 with no ischemia.   She is here today for follow up. The patient denies any chest pain, dyspnea, palpitations, lower extremity edema, orthopnea, PND, dizziness, near syncope or syncope. Only c/o back pain  Primary Care Physician: Eulas Post, MD   Past Medical History:  Diagnosis Date  . Anxiety    "more so the last few days" (10/28/2012)  . Anxiety state, unspecified 10/28/2012  . Arthritis     Right hemiarthroplasty after hip fracture 06/2011  . Arthritis    "hands" (10/28/2012)  . Bruises easily    d/t being on prednisone and plavix  . CAD (coronary artery disease)    a. Stent RCA in [facility];  b. Cath approx 2009 - nonobs per pt report.  . Cataract    immature on the left eye  . Chronic lower back pain    scoliosis  . Complication of anesthesia    pt has a very high tolerance to meds  . DDD (degenerative disc disease)   . Depression   . Diverticulitis    hx of  . Diverticulosis   . Enteritis   . Exertional shortness of breath    "every now and then" (10/28/2012)  . Giant cell arteritis ([place])   . Hemorrhoids   . History of scabies   . Hyperlipidemia    takes Lipitor daily  .  Hypertension    takes Amlodipine,Losartan,Metoprolol,and HCTZ daily  . Incontinence of urine   . Insomnia    takes Restoril nightly  . Joint pain   . Migraines    "back in my 20's; none since" (10/28/2012)  . Osteopenia   . Osteoporosis   . PAF (paroxysmal atrial fibrillation) ([place])    a. lone epidode in 2011 according to notes.  . Scoliosis   . Sinus bradycardia    a. on chronic bb  . Temporal arteritis ([place]) 2011   a. followed @ [facility]; potential flareup 10/28/2012/notes 10/28/2012  . Urinary frequency   . Vocal cord dysfunction    "they don't operate properly" (10/28/2012)    Past Surgical History:  Procedure Laterality Date  . ABDOMINAL HYSTERECTOMY  ~ 1984   vaginally  . BACK SURGERY  7-74yrs ago   X Stop  . BLADDER SUSPENSION  2001  . BREAST BIOPSY Right   . CATARACT EXTRACTION W/ INTRAOCULAR LENS IMPLANT Right ~ 08/2012  . COLONOSCOPY  01/26/2012   Procedure: COLONOSCOPY;  Surgeon: Ladene Artist, MD,FACG;  Location: [facility] ENDOSCOPY;  Service: Endoscopy;  Laterality: N/A;  note the EGD is possible  . CORONARY ANGIOPLASTY WITH STENT PLACEMENT  2006   X 1 stent  .  EPIDURAL BLOCK INJECTION    . ESOPHAGOGASTRODUODENOSCOPY  01/26/2012   Procedure: ESOPHAGOGASTRODUODENOSCOPY (EGD);  Surgeon: Ladene Artist, MD,FACG;  Location[facility] ENDOSCOPY;  Service: Endoscopy;  Laterality: N/A;  . HEMIARTHROPLASTY HIP Right 2012  . LAPAROSCOPIC CHOLECYSTECTOMY  2001  . LUMBAR FUSION  03/2013  . LUMBAR LAMINECTOMY/DECOMPRESSION MICRODISCECTOMY Right 11/04/2015   Procedure: Right Lumbar Five-Sacral One Microdiskectomy;  Surgeon: Kristeen Miss, MD;  Location: [facility] NEURO ORS;  Service: Neurosurgery;  Laterality: Right;  Right L5-S1 Microdiskectomy  . moles removed that required stiches     one on leg and one on face  . TEMPORAL ARTERY BIOPSY / LIGATION Bilateral 2011  . TONSILLECTOMY AND ADENOIDECTOMY     at age 71  . TUBAL LIGATION  ~ 1982  . X-STOP IMPLANTATION  ~ 2010   "lower back" (10/28/2012)     Current Outpatient Prescriptions  Medication Sig Dispense Refill  . amLODipine (NORVASC) 5 MG tablet TAKE 1 TABLET (5 MG TOTAL) BY MOUTH DAILY. 90 tablet 2  . calcium carbonate (OS-CAL) 600 MG TABS Take 600 mg by mouth daily.    . cholecalciferol (VITAMIN D) 1000 UNITS tablet Take 1,000 Units by mouth daily.     . clopidogrel (PLAVIX) 75 MG tablet TAKE 1 TABLET BY MOUTH EVERY DAY WITH BREAKFAST 90 tablet 1  . escitalopram (LEXAPRO) 10 MG tablet TAKE 1 TABLET BY MOUTH EVERY DAY (Patient taking differently: TAKE half TABLET BY MOUTH EVERY DAY) 90 tablet 1  . furosemide (LASIX) 20 MG tablet TAKE ONE TABLET DAILY FOR 3 DAYS AND THEN ONE DAILY AS NEEDED 90 tablet 1  . hydrochlorothiazide (MICROZIDE) 12.5 MG capsule TAKE ONE CAPSULE BY MOUTH EVERY DAY 90 capsule 1  . HYDROcodone-acetaminophen (NORCO) 10-325 MG tablet Take 1 tablet by mouth every 6 (six) hours as needed. 10 tablet 0  . KLOR-CON 10 10 MEQ tablet TAKE 1 TABLET (10 MEQ TOTAL) BY MOUTH DAILY. 90 tablet 3  . losartan (COZAAR) 100 MG tablet TAKE 1 TABLET BY MOUTH EVERY DAY 90 tablet 3  . metoprolol (LOPRESSOR) 50 MG tablet TAKE 1 TABLET BY MOUTH TWICE A DAY (Patient taking differently: Taking !/2 tablet twice daily) 180 tablet 2  . ondansetron (ZOFRAN ODT) 4 MG disintegrating tablet Take 1 tablet (4 mg total) by mouth every 8 (eight) hours as needed for nausea or vomiting. 12 tablet 0  . predniSONE (DELTASONE) 10 MG tablet Take 10 mg by mouth. Use as directed    . vitamin C (ASCORBIC ACID) 500 MG tablet Take 1,000 mg by mouth daily as needed (for sore throat).     . vitamin E (VITAMIN E) 400 UNIT capsule Take 400 Units by mouth daily.    . diazepam (VALIUM) 5 MG tablet Take 1 tablet (5 mg total) by mouth every 6 (six) hours as needed for anxiety. 30 tablet 0   Current Facility-Administered Medications  Medication Dose Route Frequency Provider Last Rate Last Dose  . 0.9 %  sodium chloride infusion   Intravenous Once Eulas Post,  MD        Allergies  Allergen Reactions  . Dextromethorphan Rash    Social History   Social History  . Marital status: Married    Spouse name: N/A  . Number of children: 3  . Years of education: N/A   Occupational History  . Retired Cabin crew    Social History Main Topics  . Smoking status: Never Smoker  . Smokeless tobacco: Never Used  . Alcohol use 0.0 oz/week  Comment: socially  . Drug use: No  . Sexual activity: Not on file   Other Topics Concern  . Not on file   Social History Narrative   Lives in [place] with her husband.  Retired Cabin crew.    Family History  Problem Relation Age of Onset  . Brain cancer Mother 37  . Heart attack Father   . Breast cancer Daughter 37  . Heart disease Other   . Hypertension Other   . Arthritis Neg Hx   . Colon cancer Neg Hx   . Osteoporosis Neg Hx     Review of Systems:  As stated in the HPI and otherwise negative.   BP 128/70   Pulse 69   Ht 5' 4.5" (1.638 m)   Wt 164 lb (74.4 kg)   SpO2 95%   BMI 27.72 kg/m   Physical Examination:  General: Well developed, well nourished, NAD  HEENT: OP clear, mucus membranes moist  SKIN: warm, dry. No rashes. Neuro: No focal deficits  Musculoskeletal: Muscle strength 5/5 all ext  Psychiatric: Mood and affect normal  Neck: No JVD, no carotid bruits, no thyromegaly, no lymphadenopathy.  Lungs:Clear bilaterally, no wheezes, rhonci, crackles Cardiovascular: Regular rate and rhythm. No murmurs, gallops or rubs. Abdomen:Soft. Bowel sounds present. Non-tender.  Extremities: No lower extremity edema. Pulses are 2 + in the bilateral DP/PT.  Cardiac cath 12/09/12: Ao: 150/61  LV: 155/13/18  RA: 6  RV: 25/5/8  PA: 24/10 (mean 16)  PCWP: 9  Fick Cardiac Output: 3.9 L/min  Fick Cardiac Index: 2.3 L/min/m2  Central Aortic Saturation: 98%  Pulmonary Artery Saturation: 69%  Angiographic Findings:  Left main: 10% distal stenosis.  Left Anterior Descending Artery: Large  caliber vessel that courses to the apex. The proximal and mid vessel has moderate calcification. The mid vessel has a long 20% stenosis. There are two small caliber diagonal branches with mild plaque disease.  Circumflex Artery: Large caliber vessel with two small caliber obtuse marginal branches. No obstructive disease.  Right Coronary Artery: Large caliber, dominant vessel with a patent stent in the mid vessel. There is minimal stent restenosis. Just beyond the stent there is a 20% stenosis.  Left Ventricular Angiogram: LVEF=65%  Impression:  1. Stable single vessel CAD with patent mid RCA stent  2. Normal LV systolic function  3. Normal PA pressures  Echo August 2014: Left ventricle: The cavity size was normal. Wall thickness was normal. Systolic function was vigorous. The estimated ejection fraction was in the range of 65% to 70%. Wall motion was normal; there were no regional wall motion abnormalities. - Pulmonary arteries: PA peak pressure: 8mm Hg (S).  EKG:  EKG is ordered today. The ekg ordered today demonstrates Sinus brady, rate 58 bpm.   Recent Labs: 10/30/2015: Hemoglobin 12.3; Platelets 221 07/08/2016: BUN 18; Creatinine, Ser 0.84; Potassium 3.8; Sodium 139; TSH 0.67   Lipid Panel    Component Value Date/Time   CHOL 148 11/13/2013 1403   TRIG 65.0 11/13/2013 1403   HDL 68.70 11/13/2013 1403   CHOLHDL 2 11/13/2013 1403   VLDL 13.0 11/13/2013 1403   LDLCALC 66 11/13/2013 1403     Wt Readings from Last 3 Encounters:  08/31/16 164 lb (74.4 kg)  07/31/16 163 lb 9.6 oz (74.2 kg)  07/22/16 159 lb (72.1 kg)     Other studies Reviewed: Additional studies/ records that were reviewed today include: . Review of the above records demonstrates:    Assessment and Plan:   1. CAD  without angina: She has no chest pain suggestive of angina. Stress test 2016 with no ischemia. She has had prior PCI with stenting of the RCA in 2005 with a Taxus DES. Will continue Plavix, statin  and beta blocker.  No ischemic evaluation prior to her planned surgery.   2. HTN: BP controlled. No changes.   3. Hyperlipidemia: Lipids need updating. We discussed today. Will repeat when I see her in 3 months. She stopped her statin. Will not restart now just before back surgery.   4. Pre-operative cardiovascular examination: She has no angina, CHF or arrhythmias. NO indication for pre operative ischemic testing. EKG normal. Proceed with surgery without further cardiac workup.   Current medicines are reviewed at length with the patient today.  The patient does not have concerns regarding medicines.  The following changes have been made:  no change  Labs/ tests ordered today include:   Orders Placed This Encounter  Procedures  . EKG 12-Lead    Disposition:   FU with me in 3 months  Signed, [name], MD 08/31/2016 3:10 PM    [facility] [address], [place], [place]  [address] Phone: [phone]; Fax: [phone]

## 2016-09-01 ENCOUNTER — Other Ambulatory Visit: Payer: Self-pay | Admitting: Family Medicine

## 2016-09-15 ENCOUNTER — Other Ambulatory Visit (INDEPENDENT_AMBULATORY_CARE_PROVIDER_SITE_OTHER): Payer: Medicare Other

## 2016-09-15 DIAGNOSIS — M316 Other giant cell arteritis: Secondary | ICD-10-CM

## 2016-09-15 DIAGNOSIS — Z79899 Other long term (current) drug therapy: Secondary | ICD-10-CM | POA: Diagnosis not present

## 2016-09-15 LAB — SEDIMENTATION RATE: SED RATE: 9 mm/h (ref 0–30)

## 2016-09-15 LAB — HIGH SENSITIVITY CRP: CRP HIGH SENSITIVITY: 1.92 mg/L (ref 0.000–5.000)

## 2016-09-18 DIAGNOSIS — M5126 Other intervertebral disc displacement, lumbar region: Secondary | ICD-10-CM | POA: Diagnosis not present

## 2016-09-18 DIAGNOSIS — M5116 Intervertebral disc disorders with radiculopathy, lumbar region: Secondary | ICD-10-CM | POA: Diagnosis not present

## 2016-09-18 DIAGNOSIS — Z981 Arthrodesis status: Secondary | ICD-10-CM | POA: Diagnosis not present

## 2016-10-12 DIAGNOSIS — H26493 Other secondary cataract, bilateral: Secondary | ICD-10-CM | POA: Diagnosis not present

## 2016-10-22 DIAGNOSIS — M5126 Other intervertebral disc displacement, lumbar region: Secondary | ICD-10-CM | POA: Diagnosis not present

## 2016-10-22 DIAGNOSIS — M545 Low back pain: Secondary | ICD-10-CM | POA: Diagnosis not present

## 2016-10-28 DIAGNOSIS — M5126 Other intervertebral disc displacement, lumbar region: Secondary | ICD-10-CM | POA: Diagnosis not present

## 2016-10-28 DIAGNOSIS — M545 Low back pain: Secondary | ICD-10-CM | POA: Diagnosis not present

## 2016-11-04 DIAGNOSIS — M545 Low back pain: Secondary | ICD-10-CM | POA: Diagnosis not present

## 2016-11-04 DIAGNOSIS — M5126 Other intervertebral disc displacement, lumbar region: Secondary | ICD-10-CM | POA: Diagnosis not present

## 2016-11-12 DIAGNOSIS — M315 Giant cell arteritis with polymyalgia rheumatica: Secondary | ICD-10-CM | POA: Diagnosis not present

## 2016-11-12 DIAGNOSIS — H26492 Other secondary cataract, left eye: Secondary | ICD-10-CM | POA: Diagnosis not present

## 2016-11-12 DIAGNOSIS — H524 Presbyopia: Secondary | ICD-10-CM | POA: Diagnosis not present

## 2016-11-24 ENCOUNTER — Other Ambulatory Visit (INDEPENDENT_AMBULATORY_CARE_PROVIDER_SITE_OTHER): Payer: Medicare Other

## 2016-11-24 DIAGNOSIS — Z79899 Other long term (current) drug therapy: Secondary | ICD-10-CM

## 2016-11-24 DIAGNOSIS — M545 Low back pain: Secondary | ICD-10-CM | POA: Diagnosis not present

## 2016-11-24 DIAGNOSIS — M316 Other giant cell arteritis: Secondary | ICD-10-CM | POA: Diagnosis not present

## 2016-11-24 DIAGNOSIS — M5126 Other intervertebral disc displacement, lumbar region: Secondary | ICD-10-CM | POA: Diagnosis not present

## 2016-11-24 LAB — SEDIMENTATION RATE: SED RATE: 4 mm/h (ref 0–30)

## 2016-11-24 LAB — HIGH SENSITIVITY CRP: CRP HIGH SENSITIVITY: 4.6 mg/L (ref 0.000–5.000)

## 2016-11-25 ENCOUNTER — Emergency Department (HOSPITAL_BASED_OUTPATIENT_CLINIC_OR_DEPARTMENT_OTHER): Payer: Medicare Other

## 2016-11-25 ENCOUNTER — Encounter (HOSPITAL_BASED_OUTPATIENT_CLINIC_OR_DEPARTMENT_OTHER): Payer: Self-pay

## 2016-11-25 ENCOUNTER — Emergency Department (HOSPITAL_BASED_OUTPATIENT_CLINIC_OR_DEPARTMENT_OTHER)
Admission: EM | Admit: 2016-11-25 | Discharge: 2016-11-26 | Disposition: A | Discharge status: Home / Self Care | Payer: Medicare Other | Attending: Emergency Medicine | Admitting: Emergency Medicine

## 2016-11-25 DIAGNOSIS — S29001A Unspecified injury of muscle and tendon of front wall of thorax, initial encounter: Secondary | ICD-10-CM | POA: Diagnosis present

## 2016-11-25 DIAGNOSIS — Y929 Unspecified place or not applicable: Secondary | ICD-10-CM | POA: Diagnosis not present

## 2016-11-25 DIAGNOSIS — Z79899 Other long term (current) drug therapy: Secondary | ICD-10-CM | POA: Insufficient documentation

## 2016-11-25 DIAGNOSIS — R222 Localized swelling, mass and lump, trunk: Secondary | ICD-10-CM | POA: Diagnosis not present

## 2016-11-25 DIAGNOSIS — W1789XA Other fall from one level to another, initial encounter: Secondary | ICD-10-CM | POA: Diagnosis not present

## 2016-11-25 DIAGNOSIS — Y999 Unspecified external cause status: Secondary | ICD-10-CM | POA: Diagnosis not present

## 2016-11-25 DIAGNOSIS — I10 Essential (primary) hypertension: Secondary | ICD-10-CM | POA: Diagnosis not present

## 2016-11-25 DIAGNOSIS — S2231XA Fracture of one rib, right side, initial encounter for closed fracture: Secondary | ICD-10-CM | POA: Insufficient documentation

## 2016-11-25 DIAGNOSIS — Z7902 Long term (current) use of antithrombotics/antiplatelets: Secondary | ICD-10-CM | POA: Diagnosis not present

## 2016-11-25 DIAGNOSIS — Z955 Presence of coronary angioplasty implant and graft: Secondary | ICD-10-CM | POA: Diagnosis not present

## 2016-11-25 DIAGNOSIS — S20211A Contusion of right front wall of thorax, initial encounter: Secondary | ICD-10-CM | POA: Diagnosis not present

## 2016-11-25 DIAGNOSIS — I2510 Atherosclerotic heart disease of native coronary artery without angina pectoris: Secondary | ICD-10-CM | POA: Insufficient documentation

## 2016-11-25 DIAGNOSIS — S298XXA Other specified injuries of thorax, initial encounter: Secondary | ICD-10-CM

## 2016-11-25 DIAGNOSIS — W19XXXA Unspecified fall, initial encounter: Secondary | ICD-10-CM

## 2016-11-25 DIAGNOSIS — Y939 Activity, unspecified: Secondary | ICD-10-CM | POA: Insufficient documentation

## 2016-11-25 DIAGNOSIS — S299XXA Unspecified injury of thorax, initial encounter: Secondary | ICD-10-CM | POA: Diagnosis not present

## 2016-11-25 MED ORDER — FENTANYL CITRATE (PF) 100 MCG/2ML IJ SOLN
50.0000 ug | Freq: Once | INTRAMUSCULAR | Status: AC
  Administered 2016-11-26: 50 ug via INTRAVENOUS
  Filled 2016-11-25: qty 2

## 2016-11-25 NOTE — ED Notes (Signed)
Pt reports having 3 previous back surgeries, with her last on being on June 22.

## 2016-11-25 NOTE — ED Triage Notes (Addendum)
Pt fell from standing position on couch onto a round wooden table-large amount of swelling to right side of mid back-states pain is worse when she takes a deep breath-denies head/neck injury-pt did have ice in place PTA-NAD-slow steady gait

## 2016-11-25 NOTE — ED Notes (Signed)
Pt's daughter states pt feels like she is going to pass out-pt is pale, diaphoretic-states her pain is worse that upon arrival-VS taken and pt taken to tx room 4-able to stand from w/c/take steps to stretcher-after lying down and comfort measures pt states she feels better

## 2016-11-26 ENCOUNTER — Telehealth: Payer: Self-pay | Admitting: *Deleted

## 2016-11-26 ENCOUNTER — Other Ambulatory Visit: Payer: Self-pay

## 2016-11-26 ENCOUNTER — Telehealth (HOSPITAL_BASED_OUTPATIENT_CLINIC_OR_DEPARTMENT_OTHER): Payer: Self-pay | Admitting: Emergency Medicine

## 2016-11-26 DIAGNOSIS — R222 Localized swelling, mass and lump, trunk: Secondary | ICD-10-CM | POA: Diagnosis not present

## 2016-11-26 DIAGNOSIS — S2231XA Fracture of one rib, right side, initial encounter for closed fracture: Secondary | ICD-10-CM | POA: Diagnosis not present

## 2016-11-26 LAB — CBC
HCT: 34.9 % — ABNORMAL LOW (ref 36.0–46.0)
Hemoglobin: 11.6 g/dL — ABNORMAL LOW (ref 12.0–15.0)
MCH: 30.4 pg (ref 26.0–34.0)
MCHC: 33.2 g/dL (ref 30.0–36.0)
MCV: 91.6 fL (ref 78.0–100.0)
PLATELETS: 229 10*3/uL (ref 150–400)
RBC: 3.81 MIL/uL — ABNORMAL LOW (ref 3.87–5.11)
RDW: 12.9 % (ref 11.5–15.5)
WBC: 12.7 10*3/uL — ABNORMAL HIGH (ref 4.0–10.5)

## 2016-11-26 LAB — BASIC METABOLIC PANEL
Anion gap: 10 (ref 5–15)
BUN: 19 mg/dL (ref 6–20)
CALCIUM: 8.9 mg/dL (ref 8.9–10.3)
CO2: 30 mmol/L (ref 22–32)
CREATININE: 1.02 mg/dL — AB (ref 0.44–1.00)
Chloride: 94 mmol/L — ABNORMAL LOW (ref 101–111)
GFR calc non Af Amer: 51 mL/min — ABNORMAL LOW (ref >60)
GFR, EST AFRICAN AMERICAN: 59 mL/min — AB (ref >60)
Glucose, Bld: 106 mg/dL — ABNORMAL HIGH (ref 65–99)
Potassium: 3 mmol/L — ABNORMAL LOW (ref 3.5–5.1)
Sodium: 134 mmol/L — ABNORMAL LOW (ref 135–145)

## 2016-11-26 MED ORDER — FENTANYL CITRATE (PF) 100 MCG/2ML IJ SOLN
25.0000 ug | Freq: Once | INTRAMUSCULAR | Status: AC
  Administered 2016-11-26: 25 ug via INTRAVENOUS
  Filled 2016-11-26: qty 2

## 2016-11-26 MED ORDER — IOPAMIDOL (ISOVUE-300) INJECTION 61%
75.0000 mL | Freq: Once | INTRAVENOUS | Status: AC | PRN
  Administered 2016-11-26: 02:00:00 via INTRAVENOUS

## 2016-11-26 MED ORDER — HYDROCODONE-ACETAMINOPHEN 5-325 MG PO TABS: 1.0000 | ORAL_TABLET | Freq: Four times a day (QID) | ORAL | 0 refills | Status: DC | PRN

## 2016-11-26 NOTE — ED Notes (Signed)
Patient transported to CT 

## 2016-11-26 NOTE — Telephone Encounter (Signed)
Betty Walthall [name], RN, BSN, [employer] [phone] Spoke with pt via telephone regarding discharge planning for [facility]. Offered pt list of home health agencies to choose from.  Pt chose [facility] to render services. [name] of [facility] notified. Patient made aware that [facility] will be in contact in 24-48 hours.  No DME needs identified at this time.

## 2016-11-26 NOTE — ED Provider Notes (Signed)
[facility] DEPT [facility] Provider Note   CSN: [id number] Arrival date & time: 11/25/16  2052     History   Chief Complaint Chief Complaint  Patient presents with  . Fall  Patient gave verbal permission to utilize photo for medical documentation only The image was not stored on any personal device   HPI Betty Jackson is a 82 y.o. female.  The history is provided by the patient, a relative and a caregiver.  Fall  This is a new problem. The current episode started 3 to 5 hours ago. The problem occurs constantly. The problem has not changed since onset.Associated symptoms include shortness of breath. Pertinent negatives include no abdominal pain and no headaches. Exacerbated by: breathing. The symptoms are relieved by rest.  pt tried standing on couch, lost her balance and fell back hitting her posterior ribs on wooden table She had immediate pain and feels SOB No hemoptysis No abd pain No head injury at that time While waiting in waiting room she had brief syncopal episode, she thinks it was due to pain She is on plavix  Past Medical History:  Diagnosis Date  . Anxiety    "more so the last few days" (10/28/2012)  . Anxiety state, unspecified 10/28/2012  . Arthritis     Right hemiarthroplasty after hip fracture 06/2011  . Arthritis    "hands" (10/28/2012)  . Bruises easily    d/t being on prednisone and plavix  . CAD (coronary artery disease)    a. Stent RCA in [facility];  b. Cath approx 2009 - nonobs per pt report.  . Cataract    immature on the left eye  . Chronic lower back pain    scoliosis  . Complication of anesthesia    pt has a very high tolerance to meds  . DDD (degenerative disc disease)   . Depression   . Diverticulitis    hx of  . Diverticulosis   . Enteritis   . Exertional shortness of breath    "every now and then" (10/28/2012)  . Giant cell arteritis ([place])   . Hemorrhoids   . History of scabies   . Hyperlipidemia    takes Lipitor daily  .  Hypertension    takes Amlodipine,Losartan,Metoprolol,and HCTZ daily  . Incontinence of urine   . Insomnia    takes Restoril nightly  . Joint pain   . Migraines    "back in my 20's; none since" (10/28/2012)  . Osteopenia   . Osteoporosis   . PAF (paroxysmal atrial fibrillation) ([place])    a. lone epidode in 2011 according to notes.  . Scoliosis   . Sinus bradycardia    a. on chronic bb  . Temporal arteritis ([place]) 2011   a. followed @ [facility]; potential flareup 10/28/2012/notes 10/28/2012  . Urinary frequency   . Vocal cord dysfunction    "they don't operate properly" (10/28/2012)    Patient Active Problem List   Diagnosis Date Noted  . Osteoporosis 05/08/2016  . Herniated nucleus pulposus, L5-S1, right 11/04/2015  . Major depressive disorder, recurrent episode, moderate ([place]) 07/16/2013  . Abdominal pain, epigastric 06/22/2013  . Lumbar stenosis 04/25/2013  . Chronic insomnia 02/07/2013  . Elevated transaminase level 02/07/2013  . CVA (cerebral infarction) 10/29/2012  . Visual changes 10/28/2012  . Depression 10/28/2012  . Anxiety state, unspecified 10/28/2012  . Anemia, unspecified 10/28/2012  . Nonspecific (abnormal) findings on radiological and other examination of gastrointestinal tract 01/25/2012  . Hyponatremia 01/24/2012  . Hematuria  01/24/2012  . Enteritis 12/24/2011  . Abdominal pain, other specified site 12/24/2011  . Leg pain, right 02/18/2011  . Temporal arteritis ([place]) 09/01/2010  . Hypertension 09/01/2010  . Hyperlipidemia 09/01/2010  . History of atrial fibrillation 09/01/2010  . CAD (coronary artery disease) 09/01/2010  . GERD (gastroesophageal reflux disease) 09/01/2010  . Insomnia 09/01/2010    Past Surgical History:  Procedure Laterality Date  . ABDOMINAL HYSTERECTOMY  ~ 1984   vaginally  . BACK SURGERY  7-53yrs ago   X Stop  . BLADDER SUSPENSION  2001  . BREAST BIOPSY Right   . CATARACT EXTRACTION W/ INTRAOCULAR LENS IMPLANT Right ~ 08/2012  .  COLONOSCOPY  01/26/2012   Procedure: COLONOSCOPY;  Surgeon: Ladene Artist, MD,FACG;  Location: [facility] ENDOSCOPY;  Service: Endoscopy;  Laterality: N/A;  note the EGD is possible  . CORONARY ANGIOPLASTY WITH STENT PLACEMENT  2006   X 1 stent  . EPIDURAL BLOCK INJECTION    . ESOPHAGOGASTRODUODENOSCOPY  01/26/2012   Procedure: ESOPHAGOGASTRODUODENOSCOPY (EGD);  Surgeon: Ladene Artist, MD,FACG;  Location: [facility] ENDOSCOPY;  Service: Endoscopy;  Laterality: N/A;  . HEMIARTHROPLASTY HIP Right 2012  . LAPAROSCOPIC CHOLECYSTECTOMY  2001  . LUMBAR FUSION  03/2013  . LUMBAR LAMINECTOMY/DECOMPRESSION MICRODISCECTOMY Right 11/04/2015   Procedure: Right Lumbar Five-Sacral One Microdiskectomy;  Surgeon: Kristeen Miss, MD;  Location: [facility] NEURO ORS;  Service: Neurosurgery;  Laterality: Right;  Right L5-S1 Microdiskectomy  . moles removed that required stiches     one on leg and one on face  . TEMPORAL ARTERY BIOPSY / LIGATION Bilateral 2011  . TONSILLECTOMY AND ADENOIDECTOMY     at age 54  . TUBAL LIGATION  ~ 1982  . X-STOP IMPLANTATION  ~ 2010   "lower back" (10/28/2012)    OB History    No data available       Home Medications    Prior to Admission medications   Medication Sig Start Date End Date Taking? Authorizing Provider  amLODipine (NORVASC) 5 MG tablet TAKE 1 TABLET (5 MG TOTAL) BY MOUTH DAILY. 09/01/16   Burchette, Alinda Sierras, MD  calcium carbonate (OS-CAL) 600 MG TABS Take 600 mg by mouth daily.    [provider]  cholecalciferol (VITAMIN D) 1000 UNITS tablet Take 1,000 Units by mouth daily.     [provider]  clopidogrel (PLAVIX) 75 MG tablet TAKE 1 TABLET BY MOUTH EVERY DAY WITH BREAKFAST 07/14/16   Burchette, Alinda Sierras, MD  diazepam (VALIUM) 5 MG tablet Take 1 tablet (5 mg total) by mouth every 6 (six) hours as needed for anxiety. 08/31/16   Burnell Blanks, MD  escitalopram (LEXAPRO) 10 MG tablet TAKE 1 TABLET BY MOUTH EVERY DAY Patient taking differently: TAKE half TABLET  BY MOUTH EVERY DAY 06/22/16   Burchette, Alinda Sierras, MD  furosemide (LASIX) 20 MG tablet TAKE ONE TABLET DAILY FOR 3 DAYS AND THEN ONE DAILY AS NEEDED 08/31/16   Burchette, Alinda Sierras, MD  hydrochlorothiazide (MICROZIDE) 12.5 MG capsule TAKE ONE CAPSULE BY MOUTH EVERY DAY 06/22/16   Burchette, Alinda Sierras, MD  HYDROcodone-acetaminophen (NORCO) 10-325 MG tablet Take 1 tablet by mouth every 6 (six) hours as needed. 06/27/16   Nona Dell, PA-C  KLOR-CON 10 10 MEQ tablet TAKE 1 TABLET (10 MEQ TOTAL) BY MOUTH DAILY. 03/02/16   Burchette, Alinda Sierras, MD  losartan (COZAAR) 100 MG tablet TAKE 1 TABLET BY MOUTH EVERY DAY 03/02/16   Burchette, Alinda Sierras, MD  metoprolol (LOPRESSOR) 50 MG tablet  TAKE 1 TABLET BY MOUTH TWICE A DAY Patient taking differently: Taking !/2 tablet twice daily 01/24/16   Burchette, Alinda Sierras, MD  ondansetron (ZOFRAN ODT) 4 MG disintegrating tablet Take 1 tablet (4 mg total) by mouth every 8 (eight) hours as needed for nausea or vomiting. 07/22/16   Burchette, Alinda Sierras, MD  predniSONE (DELTASONE) 10 MG tablet Take 10 mg by mouth. Use as directed    [provider]  vitamin C (ASCORBIC ACID) 500 MG tablet Take 1,000 mg by mouth daily as needed (for sore throat).     [provider]  vitamin E (VITAMIN E) 400 UNIT capsule Take 400 Units by mouth daily.    [provider]    Family History Family History  Problem Relation Age of Onset  . Brain cancer Mother 83  . Heart attack Father   . Breast cancer Daughter 87  . Heart disease Other   . Hypertension Other   . Arthritis Neg Hx   . Colon cancer Neg Hx   . Osteoporosis Neg Hx     Social History Social History  Substance Use Topics  . Smoking status: Never Smoker  . Smokeless tobacco: Never Used  . Alcohol use 0.0 oz/week     Comment: socially     Allergies   Dextromethorphan   Review of Systems Review of Systems  Constitutional: Negative for fever.  Respiratory: Positive for shortness of breath.     Gastrointestinal: Negative for abdominal pain.  Neurological: Negative for headaches.  All other systems reviewed and are negative.    Physical Exam Updated Vital Signs BP 134/61   Pulse (!) 51   Temp 98.5 F (36.9 C) (Oral)   Resp 18   Ht 1.626 m (5\' 4" )   Wt 75.3 kg (166 lb)   SpO2 97%   BMI 28.49 kg/m   Physical Exam CONSTITUTIONAL: elderly, frail HEAD: Normocephalic/atraumatic EYES: EOMI/PERRL ENMT: Mucous membranes moist NECK: supple no meningeal signs SPINE/BACK:entire spine nontender, well healed lumbar scar, No bruising/crepitance/stepoffs noted to spine See photo CV: S1/S2 noted, no murmurs/rubs/gallops noted LUNGS: Lungs are clear to auscultation bilaterally, no apparent distress ABDOMEN: soft, nontender GU:no cva tenderness NEURO: Pt is awake/alert/appropriate, moves all extremitiesx4.  No facial droop.   EXTREMITIES: pulses normal/equal, full ROM SKIN: warm, color normal PSYCH: no abnormalities of mood noted, alert and oriented to situation     ED Treatments / Results  Labs (all labs ordered are listed, but only abnormal results are displayed) Labs Reviewed  BASIC METABOLIC PANEL - Abnormal; Notable for the following:       Result Value   Sodium 134 (*)    Potassium 3.0 (*)    Chloride 94 (*)    Glucose, Bld 106 (*)    Creatinine, Ser 1.02 (*)    GFR calc non Af Amer 51 (*)    GFR calc Af Amer 59 (*)    All other components within normal limits  CBC - Abnormal; Notable for the following:    WBC 12.7 (*)    RBC 3.81 (*)    Hemoglobin 11.6 (*)    HCT 34.9 (*)    All other components within normal limits    EKG ED ECG REPORT   Date: 11/26/2016 0154  Rate: 65  Rhythm: normal sinus rhythm  QRS Axis: normal  Intervals: normal  ST/T Wave abnormalities: nonspecific ST changes  Conduction Disutrbances:nonspecific intraventricular conduction delay  Narrative Interpretation:   Old EKG Reviewed: unchanged  I have  personally reviewed the EKG  tracing and agree with the computerized printout as noted.  Radiology Dg Chest 2 View  Result Date: 11/25/2016 CLINICAL DATA:  Patient slipped off couch this evening. Swelling along back. EXAM: CHEST  2 VIEW COMPARISON:  12/24/2011 FINDINGS: The heart size and mediastinal contours are within normal limits. Minimal aortic atherosclerosis without aneurysm. No pneumonic consolidations. On the lateral view 10 there is prominent soft tissue swelling possibly a hematoma over the dorsum of the thoracic spine. Subsegmental atelectasis at the left lung base. No acute osseous appearing abnormality. Spinal fusion hardware seen to be included upper lumbar spine. IMPRESSION: No active cardiopulmonary disease. Dorsal soft tissue swelling of the thorax without underlying osseous abnormality. Electronically Signed   By: Ashley Royalty M.D.   On: 11/25/2016 21:52    Procedures Procedures   DEFINITVE FRACTURE CARE - RIB FRACTURE Pt with rib fracture Discussed use of pain meds, incentive spirometer We discussed return precautions - fever>100, worsened shortness of breath, hemoptysis   Medications Ordered in ED Medications  fentaNYL (SUBLIMAZE) injection 50 mcg (not administered)     Initial Impression / Assessment and Plan / ED Course  I have reviewed the triage vital signs and the nursing notes.  Pertinent labs & imaging results that were available during my care of the patient were reviewed by me and considered in my medical decision making (see chart for details).  Narcotic database reviewed and considered in decision making     12:29 AM Pt with large/tender soft tissue swelling to posterior chest Due to h/o plavix use will obtain CT chest to evaluate any active bleed or any occult fractures No signs of spinal or abdominal trauma Pt can ambulate.  No hypoxia Pt wants to go home and feels comfortable for d/c home    CT findings note rib fracture No other acute injury Pt improved I offered  admission but she declines Home health ordered Advised close PCP followup She has nearly run out of pain meds for her back pain and has none scheduled for pick up Will give course of #15 vicodin Needs to f/u with PCP further pain control We discussed strict ER return precautions   Final Clinical Impressions(s) / ED Diagnoses   Final diagnoses:  Fall, initial encounter  Blunt trauma to chest, initial encounter  Closed fracture of one rib of right side, initial encounter  Hematoma of right chest wall, initial encounter    New Prescriptions Discharge Medication List as of 11/26/2016  3:11 AM    START taking these medications   Details  HYDROcodone-acetaminophen (NORCO/VICODIN) 5-325 MG tablet Take 1 tablet by mouth every 6 (six) hours as needed for severe pain., Starting Thu 11/26/2016, Print         Ripley Fraise, MD 11/26/16 [phone]

## 2016-11-26 NOTE — ED Notes (Signed)
Ambulated patient via pulse Ox. Patient maintained pulse ox of 92-94% with heart rate of 66-68. Patient maintained a slow gait assisted by hall railing. Patient asked if she had access to a walker and said answered yes I have one at home. Patient seems frail although voices that she wants to go home. Patient returned to room.

## 2016-11-26 NOTE — ED Notes (Addendum)
Pt repositioned on her side and resting comfortably on the stretcher. Pt in NAD.

## 2016-11-27 ENCOUNTER — Ambulatory Visit (INDEPENDENT_AMBULATORY_CARE_PROVIDER_SITE_OTHER): Payer: Medicare Other | Admitting: Family Medicine

## 2016-11-27 ENCOUNTER — Telehealth: Payer: Self-pay | Admitting: Family Medicine

## 2016-11-27 ENCOUNTER — Encounter: Payer: Self-pay | Admitting: Family Medicine

## 2016-11-27 VITALS — BP 120/70 | HR 84 | Temp 98.2°F | Ht 64.0 in | Wt 169.1 lb

## 2016-11-27 DIAGNOSIS — E876 Hypokalemia: Secondary | ICD-10-CM

## 2016-11-27 DIAGNOSIS — S2241XD Multiple fractures of ribs, right side, subsequent encounter for fracture with routine healing: Secondary | ICD-10-CM

## 2016-11-27 DIAGNOSIS — S300XXD Contusion of lower back and pelvis, subsequent encounter: Secondary | ICD-10-CM

## 2016-11-27 DIAGNOSIS — I2510 Atherosclerotic heart disease of native coronary artery without angina pectoris: Secondary | ICD-10-CM

## 2016-11-27 NOTE — Telephone Encounter (Signed)
Santiago Glad is calling to get the start of care delayed until Tuesday 12/01/16 due to the pt has a lot going on today and this weekend (granddaughter getting married)

## 2016-11-27 NOTE — Telephone Encounter (Signed)
Verbal given to Santiago Glad.

## 2016-11-27 NOTE — Telephone Encounter (Signed)
OK 

## 2016-11-27 NOTE — Patient Instructions (Addendum)
Take two potassium tablets once daily for two days and then one daily Continue with incentive spirometer several times daily. Follow up immediately for any fever- otherwise in 4 days We will repeat K in 4 days.

## 2016-11-27 NOTE — Progress Notes (Signed)
Subjective:     Patient ID: Betty Jackson, female   DOB: [birth year], 82 y.o.   MRN: [id number]  HPI Patient seen for hospital follow-up. 2 days ago she was helping her daughter with balancing some pictures on the wall. She was standing up on the couch trying to straighten up some pictures and lost her balance and fell into a very hard coffee table striking her right posterior back. She had some immediate swelling. She's had several back surgeries and initially had concern regarding that.  CT scan revealed fracture right eighth posterior rib. No other abnormalities noted. Patient prescribed hydrocodone 5 mg and she has taken just couple he so far. She is using incentive spirometer several times daily.  Patient had some labs and had potassium 3.0. She is on potassium supplement but has not been taking regularly of recent. She takes HCTZ. Occasional cough. No fever. Pain with deep breathing from her fracture. She had fairly large hematoma which she states has gone down substantially in size from initially.  She had no injury to head  Past Medical History:  Diagnosis Date  . Anxiety    "more so the last few days" (10/28/2012)  . Anxiety state, unspecified 10/28/2012  . Arthritis     Right hemiarthroplasty after hip fracture 06/2011  . Arthritis    "hands" (10/28/2012)  . Bruises easily    d/t being on prednisone and plavix  . CAD (coronary artery disease)    a. Stent RCA in [facility];  b. Cath approx 2009 - nonobs per pt report.  . Cataract    immature on the left eye  . Chronic lower back pain    scoliosis  . Complication of anesthesia    pt has a very high tolerance to meds  . DDD (degenerative disc disease)   . Depression   . Diverticulitis    hx of  . Diverticulosis   . Enteritis   . Exertional shortness of breath    "every now and then" (10/28/2012)  . Giant cell arteritis ([place])   . Hemorrhoids   . History of scabies   . Hyperlipidemia    takes Lipitor daily  . Hypertension     takes Amlodipine,Losartan,Metoprolol,and HCTZ daily  . Incontinence of urine   . Insomnia    takes Restoril nightly  . Joint pain   . Migraines    "back in my 20's; none since" (10/28/2012)  . Osteopenia   . Osteoporosis   . PAF (paroxysmal atrial fibrillation) ([place])    a. lone epidode in 2011 according to notes.  . Scoliosis   . Sinus bradycardia    a. on chronic bb  . Temporal arteritis ([place]) 2011   a. followed @ [facility]; potential flareup 10/28/2012/notes 10/28/2012  . Urinary frequency   . Vocal cord dysfunction    "they don't operate properly" (10/28/2012)   Past Surgical History:  Procedure Laterality Date  . ABDOMINAL HYSTERECTOMY  ~ 1984   vaginally  . BACK SURGERY  7-61yrs ago   X Stop  . BLADDER SUSPENSION  2001  . BREAST BIOPSY Right   . CATARACT EXTRACTION W/ INTRAOCULAR LENS IMPLANT Right ~ 08/2012  . COLONOSCOPY  01/26/2012   Procedure: COLONOSCOPY;  Surgeon: Ladene Artist, MD,FACG;  Location: [facility] ENDOSCOPY;  Service: Endoscopy;  Laterality: N/A;  note the EGD is possible  . CORONARY ANGIOPLASTY WITH STENT PLACEMENT  2006   X 1 stent  . EPIDURAL BLOCK INJECTION    . ESOPHAGOGASTRODUODENOSCOPY  01/26/2012   Procedure: ESOPHAGOGASTRODUODENOSCOPY (EGD);  Surgeon: Ladene Artist, MD,FACG;  Location: [facility] ENDOSCOPY;  Service: Endoscopy;  Laterality: N/A;  . HEMIARTHROPLASTY HIP Right 2012  . LAPAROSCOPIC CHOLECYSTECTOMY  2001  . LUMBAR FUSION  03/2013  . LUMBAR LAMINECTOMY/DECOMPRESSION MICRODISCECTOMY Right 11/04/2015   Procedure: Right Lumbar Five-Sacral One Microdiskectomy;  Surgeon: Kristeen Miss, MD;  Location: [facility] NEURO ORS;  Service: Neurosurgery;  Laterality: Right;  Right L5-S1 Microdiskectomy  . moles removed that required stiches     one on leg and one on face  . TEMPORAL ARTERY BIOPSY / LIGATION Bilateral 2011  . TONSILLECTOMY AND ADENOIDECTOMY     at age 5  . TUBAL LIGATION  ~ 1982  . X-STOP IMPLANTATION  ~ 2010   "lower back" (10/28/2012)    reports that she  has never smoked. She has never used smokeless tobacco. She reports that she drinks alcohol. She reports that she does not use drugs. family history includes Brain cancer (age of onset: 41) in her mother; Breast cancer (age of onset: 63) in her daughter; Heart attack in her father; Heart disease in her other; Hypertension in her other. Allergies  Allergen Reactions  . Dextromethorphan Rash     Review of Systems  Constitutional: Negative for chills and fever.  Respiratory: Positive for cough. Negative for shortness of breath.   Cardiovascular: Negative for chest pain.  Neurological: Negative for dizziness and headaches.  Psychiatric/Behavioral: Negative for confusion.       Objective:   Physical Exam  Constitutional: She is oriented to person, place, and time. She appears well-developed and well-nourished.  Cardiovascular: Normal rate and regular rhythm.   Pulmonary/Chest: Effort normal and breath sounds normal. No respiratory distress. She has no wheezes. She has no rales.  Patient has hematoma 11 x 16 cm right thoracic area. No breaks in skin. She has some mild discoloration. Minimally tender to palpation.  Musculoskeletal: She exhibits no edema.  Neurological: She is alert and oriented to person, place, and time. No cranial nerve deficit.       Assessment:     #1 Status post recent fall with right eighth posterior rib fracture and large hematoma right thoracic area  #2 Hypokalemia with potassium 3.0 in patient on HCTZ    Plan:     -Continue Vicodin as needed for pain control -Continue incentive spirometer several times daily -Get back on potassium replacement and recommend she take 2 tablets daily for 2 days then 1 daily until follow-up and check basic metabolic panel at follow-up in 4 days -Follow-up immediately for any fever or other concerning symptoms  Eulas Post MD [facility]

## 2016-12-01 ENCOUNTER — Telehealth: Payer: Self-pay | Admitting: Family Medicine

## 2016-12-01 DIAGNOSIS — I480 Paroxysmal atrial fibrillation: Secondary | ICD-10-CM | POA: Diagnosis not present

## 2016-12-01 DIAGNOSIS — W08XXXD Fall from other furniture, subsequent encounter: Secondary | ICD-10-CM | POA: Diagnosis not present

## 2016-12-01 DIAGNOSIS — Z7952 Long term (current) use of systemic steroids: Secondary | ICD-10-CM | POA: Diagnosis not present

## 2016-12-01 DIAGNOSIS — Z7901 Long term (current) use of anticoagulants: Secondary | ICD-10-CM | POA: Diagnosis not present

## 2016-12-01 DIAGNOSIS — F329 Major depressive disorder, single episode, unspecified: Secondary | ICD-10-CM | POA: Diagnosis not present

## 2016-12-01 DIAGNOSIS — F419 Anxiety disorder, unspecified: Secondary | ICD-10-CM | POA: Diagnosis not present

## 2016-12-01 DIAGNOSIS — G8929 Other chronic pain: Secondary | ICD-10-CM | POA: Diagnosis not present

## 2016-12-01 DIAGNOSIS — S2231XD Fracture of one rib, right side, subsequent encounter for fracture with routine healing: Secondary | ICD-10-CM | POA: Diagnosis not present

## 2016-12-01 DIAGNOSIS — I10 Essential (primary) hypertension: Secondary | ICD-10-CM | POA: Diagnosis not present

## 2016-12-01 DIAGNOSIS — Z9181 History of falling: Secondary | ICD-10-CM | POA: Diagnosis not present

## 2016-12-01 DIAGNOSIS — I2510 Atherosclerotic heart disease of native coronary artery without angina pectoris: Secondary | ICD-10-CM | POA: Diagnosis not present

## 2016-12-01 DIAGNOSIS — M48061 Spinal stenosis, lumbar region without neurogenic claudication: Secondary | ICD-10-CM | POA: Diagnosis not present

## 2016-12-01 DIAGNOSIS — M316 Other giant cell arteritis: Secondary | ICD-10-CM | POA: Diagnosis not present

## 2016-12-01 DIAGNOSIS — Z79891 Long term (current) use of opiate analgesic: Secondary | ICD-10-CM | POA: Diagnosis not present

## 2016-12-01 NOTE — Telephone Encounter (Signed)
Betty Jackson has opened a case for the R rib fracture and will have PT and possible OT they will be going out tomorrow.  Is it okay to send the [place].

## 2016-12-02 ENCOUNTER — Encounter: Payer: Self-pay | Admitting: Family Medicine

## 2016-12-02 ENCOUNTER — Ambulatory Visit (INDEPENDENT_AMBULATORY_CARE_PROVIDER_SITE_OTHER): Payer: Medicare Other | Admitting: Family Medicine

## 2016-12-02 ENCOUNTER — Telehealth: Payer: Self-pay | Admitting: Family Medicine

## 2016-12-02 VITALS — BP 130/80 | HR 58 | Temp 98.2°F | Wt 175.7 lb

## 2016-12-02 DIAGNOSIS — S300XXD Contusion of lower back and pelvis, subsequent encounter: Secondary | ICD-10-CM | POA: Diagnosis not present

## 2016-12-02 DIAGNOSIS — E876 Hypokalemia: Secondary | ICD-10-CM | POA: Diagnosis not present

## 2016-12-02 DIAGNOSIS — I2510 Atherosclerotic heart disease of native coronary artery without angina pectoris: Secondary | ICD-10-CM

## 2016-12-02 DIAGNOSIS — Z9181 History of falling: Secondary | ICD-10-CM | POA: Diagnosis not present

## 2016-12-02 DIAGNOSIS — S2231XD Fracture of one rib, right side, subsequent encounter for fracture with routine healing: Secondary | ICD-10-CM

## 2016-12-02 DIAGNOSIS — M48061 Spinal stenosis, lumbar region without neurogenic claudication: Secondary | ICD-10-CM | POA: Diagnosis not present

## 2016-12-02 DIAGNOSIS — W08XXXD Fall from other furniture, subsequent encounter: Secondary | ICD-10-CM | POA: Diagnosis not present

## 2016-12-02 DIAGNOSIS — I10 Essential (primary) hypertension: Secondary | ICD-10-CM | POA: Diagnosis not present

## 2016-12-02 LAB — BASIC METABOLIC PANEL
BUN: 11 mg/dL (ref 6–23)
CO2: 29 meq/L (ref 19–32)
Calcium: 8.8 mg/dL (ref 8.4–10.5)
Chloride: 98 mEq/L (ref 96–112)
Creatinine, Ser: 0.72 mg/dL (ref 0.40–1.20)
GFR: 82.75 mL/min (ref >60.00)
GLUCOSE: 83 mg/dL (ref 70–99)
POTASSIUM: 3.9 meq/L (ref 3.5–5.1)
SODIUM: 135 meq/L (ref 135–145)

## 2016-12-02 MED ORDER — OXYCODONE HCL 5 MG PO CAPS: ORAL_CAPSULE | ORAL | 0 refills | Status: DC

## 2016-12-02 MED ORDER — POTASSIUM CHLORIDE ER 10 MEQ PO TBCR: EXTENDED_RELEASE_TABLET | ORAL | 3 refills | Status: DC

## 2016-12-02 NOTE — Telephone Encounter (Signed)
Is this verbal order okay?

## 2016-12-02 NOTE — Progress Notes (Signed)
Subjective:     Patient ID: Betty Jackson, female   DOB: 09-Feb-1935, 82 y.o.   MRN: [id number]  HPI Patient seen for follow-up regarding recent injury. Refer to recent note. She was standing on the back of a couch trying to adjust a picture and fell back on a coffee table striking her right thoracic area. She's had multiple previous back surgeries but fortunately not have any pain around the site of her lumbar surgery which was the most recent. She had large hematoma right thoracic area and right eighth rib fracture posterior. She still having moderate pain. Using hydrocodone for pain but she states sometimes has to take two of those and has some mild nausea without vomiting. She states she has taken oxycodone without nausea in the past. Her pain is 8 out of 10 severity at its worst.  This morning we received a request forPT and OT and we approved these verbally. Patient is ambulating without difficulty. She has some pain with deep breathing as expected. She is using incentive spirometer. No cough. No fevers or chills.  Patient had low potassium during ER evaluation with potassium 3.0. She takes furosemide and is on potassium replacement.  Past Medical History:  Diagnosis Date  . Anxiety    "more so the last few days" (10/28/2012)  . Anxiety state, unspecified 10/28/2012  . Arthritis     Right hemiarthroplasty after hip fracture 06/2011  . Arthritis    "hands" (10/28/2012)  . Bruises easily    d/t being on prednisone and plavix  . CAD (coronary artery disease)    a. Stent RCA in [facility];  b. Cath approx 2009 - nonobs per pt report.  . Cataract    immature on the left eye  . Chronic lower back pain    scoliosis  . Complication of anesthesia    pt has a very high tolerance to meds  . DDD (degenerative disc disease)   . Depression   . Diverticulitis    hx of  . Diverticulosis   . Enteritis   . Exertional shortness of breath    "every now and then" (10/28/2012)  . Giant cell arteritis  ([place])   . Hemorrhoids   . History of scabies   . Hyperlipidemia    takes Lipitor daily  . Hypertension    takes Amlodipine,Losartan,Metoprolol,and HCTZ daily  . Incontinence of urine   . Insomnia    takes Restoril nightly  . Joint pain   . Migraines    "back in my 20's; none since" (10/28/2012)  . Osteopenia   . Osteoporosis   . PAF (paroxysmal atrial fibrillation) ([place])    a. lone epidode in 2011 according to notes.  . Scoliosis   . Sinus bradycardia    a. on chronic bb  . Temporal arteritis ([place]) 2011   a. followed @ [facility]; potential flareup 10/28/2012/notes 10/28/2012  . Urinary frequency   . Vocal cord dysfunction    "they don't operate properly" (10/28/2012)   Past Surgical History:  Procedure Laterality Date  . ABDOMINAL HYSTERECTOMY  ~ 1984   vaginally  . BACK SURGERY  7-50yrs ago   X Stop  . BLADDER SUSPENSION  2001  . BREAST BIOPSY Right   . CATARACT EXTRACTION W/ INTRAOCULAR LENS IMPLANT Right ~ 08/2012  . COLONOSCOPY  01/26/2012   Procedure: COLONOSCOPY;  Surgeon: Ladene Artist, MD,FACG;  Location: [facility] ENDOSCOPY;  Service: Endoscopy;  Laterality: N/A;  note the EGD is possible  . CORONARY  ANGIOPLASTY WITH STENT PLACEMENT  2006   X 1 stent  . EPIDURAL BLOCK INJECTION    . ESOPHAGOGASTRODUODENOSCOPY  01/26/2012   Procedure: ESOPHAGOGASTRODUODENOSCOPY (EGD);  Surgeon: Ladene Artist, MD,FACG;  Location: [facility] ENDOSCOPY;  Service: Endoscopy;  Laterality: N/A;  . HEMIARTHROPLASTY HIP Right 2012  . LAPAROSCOPIC CHOLECYSTECTOMY  2001  . LUMBAR FUSION  03/2013  . LUMBAR LAMINECTOMY/DECOMPRESSION MICRODISCECTOMY Right 11/04/2015   Procedure: Right Lumbar Five-Sacral One Microdiskectomy;  Surgeon: Kristeen Miss, MD;  Location: [place] NEURO ORS;  Service: Neurosurgery;  Laterality: Right;  Right L5-S1 Microdiskectomy  . moles removed that required stiches     one on leg and one on face  . TEMPORAL ARTERY BIOPSY / LIGATION Bilateral 2011  . TONSILLECTOMY AND ADENOIDECTOMY     at age 52   . TUBAL LIGATION  ~ 1982  . X-STOP IMPLANTATION  ~ 2010   "lower back" (10/28/2012)    reports that she has never smoked. She has never used smokeless tobacco. She reports that she drinks alcohol. She reports that she does not use drugs. family history includes Brain cancer (age of onset: 87) in her mother; Breast cancer (age of onset: 7) in her daughter; Heart attack in her father; Heart disease in her other; Hypertension in her other. Allergies  Allergen Reactions  . Dextromethorphan Rash     Review of Systems  Constitutional: Negative for chills and fever.  Respiratory: Negative for cough and shortness of breath.   Cardiovascular: Negative for chest pain.  Gastrointestinal: Negative for abdominal pain.  Musculoskeletal: Positive for back pain.  Neurological: Negative for dizziness and headaches.  Psychiatric/Behavioral: Negative for confusion.       Objective:   Physical Exam  Constitutional: She appears well-developed and well-nourished.  Cardiovascular: Normal rate and regular rhythm.   Pulmonary/Chest: Effort normal and breath sounds normal. No respiratory distress. She has no wheezes. She has no rales.  Musculoskeletal:  Patient has large hematoma right thoracic area back. She has some surrounding bruising. No erythema or warmth. No spinal tenderness.  Trace edema nonpitting feet ankles and legs bilaterally.         Assessment:     #1 hypokalemia by recent lab work  #2 hematoma right thoracic back  #3 right eighth posterior rib fracture still in moderate pain    Plan:     -Discontinue hydrocodone -Oxycodone 5 mg 1-2 every 6 hours as needed for severe pain #40 with no refill -Try some low-grade heat over hematoma in the days ahead. -Order given this morning for home PT and OT -Check basic metabolic panel today -Patient is aware that she'll have several weeks of pain from her healing rib fracture and hematoma will also likely take several months to  resolve  Eulas Post MD [facility]

## 2016-12-02 NOTE — Telephone Encounter (Signed)
ok 

## 2016-12-02 NOTE — Telephone Encounter (Signed)
yes

## 2016-12-02 NOTE — Telephone Encounter (Signed)
Spoke with Betty Jackson and verbal orders given

## 2016-12-02 NOTE — Patient Instructions (Signed)
Hematoma A hematoma is a collection of blood under the skin, in an organ, in a body space, in a joint space, or in other tissue. The blood can clot to form a lump that you can see and feel. The lump is often firm and may sometimes become sore and tender. Most hematomas get better in a few days to weeks. However, some hematomas may be serious and require medical care. Hematomas can range in size from very small to very large. What are the causes? A hematoma can be caused by a blunt or penetrating injury. It can also be caused by spontaneous leakage from a blood vessel under the skin. Spontaneous leakage from a blood vessel is more likely to occur in older people, especially those taking blood thinners. Sometimes, a hematoma can develop after certain medical procedures. What are the signs or symptoms?  A firm lump on the body.  Possible pain and tenderness in the area.  Bruising.Blue, dark blue, purple-red, or yellowish skin may appear at the site of the hematoma if the hematoma is close to the surface of the skin. For hematomas in deeper tissues or body spaces, the signs and symptoms may be subtle. For example, an intra-abdominal hematoma may cause abdominal pain, weakness, fainting, and shortness of breath. An intracranial hematoma may cause a headache or symptoms such as weakness, trouble speaking, or a change in consciousness. How is this diagnosed? A hematoma can usually be diagnosed based on your medical history and a physical exam. Imaging tests may be needed if your health care provider suspects a hematoma in deeper tissues or body spaces, such as the abdomen, head, or chest. These tests may include ultrasonography or a CT scan. How is this treated? Hematomas usually go away on their own over time. Rarely does the blood need to be drained out of the body. Large hematomas or those that may affect vital organs will sometimes need surgical drainage or monitoring. Follow these instructions at  home:  Apply ice to the injured area:  Put ice in a plastic bag.  Place a towel between your skin and the bag.  Leave the ice on for 20 minutes, 2-3 times a day for the first 1 to 2 days.  After the first 2 days, switch to using warm compresses on the hematoma.  Elevate the injured area to help decrease pain and swelling. Wrapping the area with an elastic bandage may also be helpful. Compression helps to reduce swelling and promotes shrinking of the hematoma. Make sure the bandage is not wrapped too tight.  If your hematoma is on a lower extremity and is painful, crutches may be helpful for a couple days.  Only take over-the-counter or prescription medicines as directed by your health care provider. Get help right away if:  You have increasing pain, or your pain is not controlled with medicine.  You have a fever.  You have worsening swelling or discoloration.  Your skin over the hematoma breaks or starts bleeding.  Your hematoma is in your chest or abdomen and you have weakness, shortness of breath, or a change in consciousness.  Your hematoma is on your scalp (caused by a fall or injury) and you have a worsening headache or a change in alertness or consciousness. This information is not intended to replace advice given to you by your health care provider. Make sure you discuss any questions you have with your health care provider. Document Released: 10/29/2003 Document Revised: 08/22/2015 Document Reviewed: 08/24/2012 Elsevier Interactive Patient   Education  2017 Elsevier Inc.  

## 2016-12-02 NOTE — Telephone Encounter (Signed)
[facility] is calling requesting verbal order for home health physical therapy for twice a wk for 3 wks and then once a wk for 2 wks starting next week.

## 2016-12-02 NOTE — Telephone Encounter (Signed)
Spoke with Langley Gauss, two more visits for PT and OT okayed verbally per Dr Elease Hashimoto. Nothing further needed.

## 2016-12-07 DIAGNOSIS — M48061 Spinal stenosis, lumbar region without neurogenic claudication: Secondary | ICD-10-CM | POA: Diagnosis not present

## 2016-12-07 DIAGNOSIS — I2510 Atherosclerotic heart disease of native coronary artery without angina pectoris: Secondary | ICD-10-CM | POA: Diagnosis not present

## 2016-12-07 DIAGNOSIS — I10 Essential (primary) hypertension: Secondary | ICD-10-CM | POA: Diagnosis not present

## 2016-12-07 DIAGNOSIS — W08XXXD Fall from other furniture, subsequent encounter: Secondary | ICD-10-CM | POA: Diagnosis not present

## 2016-12-07 DIAGNOSIS — Z9181 History of falling: Secondary | ICD-10-CM | POA: Diagnosis not present

## 2016-12-07 DIAGNOSIS — S2231XD Fracture of one rib, right side, subsequent encounter for fracture with routine healing: Secondary | ICD-10-CM | POA: Diagnosis not present

## 2016-12-08 DIAGNOSIS — W08XXXD Fall from other furniture, subsequent encounter: Secondary | ICD-10-CM | POA: Diagnosis not present

## 2016-12-08 DIAGNOSIS — I10 Essential (primary) hypertension: Secondary | ICD-10-CM | POA: Diagnosis not present

## 2016-12-08 DIAGNOSIS — M48061 Spinal stenosis, lumbar region without neurogenic claudication: Secondary | ICD-10-CM | POA: Diagnosis not present

## 2016-12-08 DIAGNOSIS — Z9181 History of falling: Secondary | ICD-10-CM | POA: Diagnosis not present

## 2016-12-08 DIAGNOSIS — S2231XD Fracture of one rib, right side, subsequent encounter for fracture with routine healing: Secondary | ICD-10-CM | POA: Diagnosis not present

## 2016-12-08 DIAGNOSIS — I2510 Atherosclerotic heart disease of native coronary artery without angina pectoris: Secondary | ICD-10-CM | POA: Diagnosis not present

## 2016-12-09 DIAGNOSIS — I2510 Atherosclerotic heart disease of native coronary artery without angina pectoris: Secondary | ICD-10-CM | POA: Diagnosis not present

## 2016-12-09 DIAGNOSIS — M48061 Spinal stenosis, lumbar region without neurogenic claudication: Secondary | ICD-10-CM | POA: Diagnosis not present

## 2016-12-09 DIAGNOSIS — W08XXXD Fall from other furniture, subsequent encounter: Secondary | ICD-10-CM | POA: Diagnosis not present

## 2016-12-09 DIAGNOSIS — Z9181 History of falling: Secondary | ICD-10-CM | POA: Diagnosis not present

## 2016-12-09 DIAGNOSIS — S2231XD Fracture of one rib, right side, subsequent encounter for fracture with routine healing: Secondary | ICD-10-CM | POA: Diagnosis not present

## 2016-12-09 DIAGNOSIS — I10 Essential (primary) hypertension: Secondary | ICD-10-CM | POA: Diagnosis not present

## 2016-12-10 ENCOUNTER — Telehealth: Payer: Self-pay | Admitting: Family Medicine

## 2016-12-10 DIAGNOSIS — Z961 Presence of intraocular lens: Secondary | ICD-10-CM | POA: Diagnosis not present

## 2016-12-10 DIAGNOSIS — M315 Giant cell arteritis with polymyalgia rheumatica: Secondary | ICD-10-CM | POA: Diagnosis not present

## 2016-12-10 DIAGNOSIS — H26493 Other secondary cataract, bilateral: Secondary | ICD-10-CM | POA: Diagnosis not present

## 2016-12-10 NOTE — Telephone Encounter (Signed)
[facility] states they were to d/c pt this week. However, pt is having eye surgery next week and is requesting they extend home health nursing for another week.  Would like a verbal that this is ok.

## 2016-12-11 ENCOUNTER — Telehealth: Payer: Self-pay | Admitting: Family Medicine

## 2016-12-11 MED ORDER — OXYCODONE HCL 5 MG PO CAPS: ORAL_CAPSULE | ORAL | 0 refills | Status: DC

## 2016-12-11 NOTE — Telephone Encounter (Signed)
OK 

## 2016-12-11 NOTE — Telephone Encounter (Signed)
Refill once for #30- try to use only for severe pain.

## 2016-12-11 NOTE — Telephone Encounter (Signed)
Verbal orders given to Susan.  

## 2016-12-11 NOTE — Telephone Encounter (Signed)
Pt needs new rx oxycodone 5 mg. Pt is aware may take up to 3 business days

## 2016-12-11 NOTE — Telephone Encounter (Signed)
Rx ready for pick up.  attempted to call patient but voicemail is not set up yet.

## 2016-12-14 ENCOUNTER — Telehealth: Payer: Self-pay | Admitting: Cardiovascular Disease

## 2016-12-14 ENCOUNTER — Ambulatory Visit (INDEPENDENT_AMBULATORY_CARE_PROVIDER_SITE_OTHER): Payer: Medicare Other | Admitting: Cardiovascular Disease

## 2016-12-14 ENCOUNTER — Encounter: Payer: Self-pay | Admitting: Cardiovascular Disease

## 2016-12-14 VITALS — BP 110/60 | HR 75 | Ht 64.0 in | Wt 171.0 lb

## 2016-12-14 DIAGNOSIS — E7800 Pure hypercholesterolemia, unspecified: Secondary | ICD-10-CM | POA: Diagnosis not present

## 2016-12-14 DIAGNOSIS — I2510 Atherosclerotic heart disease of native coronary artery without angina pectoris: Secondary | ICD-10-CM

## 2016-12-14 DIAGNOSIS — I10 Essential (primary) hypertension: Secondary | ICD-10-CM | POA: Diagnosis not present

## 2016-12-14 MED ORDER — DIAZEPAM 5 MG PO TABS: 5.0000 mg | ORAL_TABLET | Freq: Four times a day (QID) | ORAL | 0 refills | Status: DC | PRN

## 2016-12-14 NOTE — Progress Notes (Signed)
Chief Complaint  Patient presents with  . Follow-up    CAD   History of Present Illness: 82 yo female with history of CAD s/p stent RCA, HTN, HLD, GERD, temporal arteritis here today for cardiac follow up. She had a stent placed in the RCA in [place], [place] in 2005. She moved to [facility] for treatment of her temporal arteritis at [facility]. She had normal ABI in 2012. She had a possible CVA in 2014 and was placed on Plavix but later it was felt that this could have been due to her temporal arteritis. Mild carotid disease by dopplers 2014. Cardiac cath 12/09/12 with patent RCA stent, mild disease LAD and Circumflex, normal filling pressures. Echo August 2014 with normal LV size and function, no significant valve issues. She was seen in our office 06/19/14 with c/o chest pain. Stress myoview 06/29/14 with no ischemia.   She is here today for follow up of her CAD. The patient denies any chest pain, dyspnea, palpitations, lower extremity edema, orthopnea, PND, dizziness, near syncope or syncope. She has pain from her recent fall and broken rib. Also reports anxiety.   Primary Care Physician: Eulas Post, MD   Past Medical History:  Diagnosis Date  . Anxiety    "more so the last few days" (10/28/2012)  . Anxiety state, unspecified 10/28/2012  . Arthritis     Right hemiarthroplasty after hip fracture 06/2011  . Arthritis    "hands" (10/28/2012)  . Bruises easily    d/t being on prednisone and plavix  . CAD (coronary artery disease)    a. Stent RCA in [facility];  b. Cath approx 2009 - nonobs per pt report.  . Cataract    immature on the left eye  . Chronic lower back pain    scoliosis  . Complication of anesthesia    pt has a very high tolerance to meds  . DDD (degenerative disc disease)   . Depression   . Diverticulitis    hx of  . Diverticulosis   . Enteritis   . Exertional shortness of breath    "every now and then" (10/28/2012)  . Giant cell arteritis ([place])   . Hemorrhoids   . History  of scabies   . Hyperlipidemia    takes Lipitor daily  . Hypertension    takes Amlodipine,Losartan,Metoprolol,and HCTZ daily  . Incontinence of urine   . Insomnia    takes Restoril nightly  . Joint pain   . Migraines    "back in my 20's; none since" (10/28/2012)  . Osteopenia   . Osteoporosis   . PAF (paroxysmal atrial fibrillation) ([place])    a. lone epidode in 2011 according to notes.  . Scoliosis   . Sinus bradycardia    a. on chronic bb  . Temporal arteritis ([place]) 2011   a. followed @ [facility]; potential flareup 10/28/2012/notes 10/28/2012  . Urinary frequency   . Vocal cord dysfunction    "they don't operate properly" (10/28/2012)    Past Surgical History:  Procedure Laterality Date  . ABDOMINAL HYSTERECTOMY  ~ 1984   vaginally  . BACK SURGERY  7-25yrs ago   X Stop  . BLADDER SUSPENSION  2001  . BREAST BIOPSY Right   . CATARACT EXTRACTION W/ INTRAOCULAR LENS IMPLANT Right ~ 08/2012  . COLONOSCOPY  01/26/2012   Procedure: COLONOSCOPY;  Surgeon: Ladene Artist, MD,FACG;  Location: [facility] ENDOSCOPY;  Service: Endoscopy;  Laterality: N/A;  note the EGD is possible  . CORONARY  ANGIOPLASTY WITH STENT PLACEMENT  2006   X 1 stent  . EPIDURAL BLOCK INJECTION    . ESOPHAGOGASTRODUODENOSCOPY  01/26/2012   Procedure: ESOPHAGOGASTRODUODENOSCOPY (EGD);  Surgeon: Ladene Artist, MD,FACG;  Location: [facility] ENDOSCOPY;  Service: Endoscopy;  Laterality: N/A;  . HEMIARTHROPLASTY HIP Right 2012  . LAPAROSCOPIC CHOLECYSTECTOMY  2001  . LUMBAR FUSION  03/2013  . LUMBAR LAMINECTOMY/DECOMPRESSION MICRODISCECTOMY Right 11/04/2015   Procedure: Right Lumbar Five-Sacral One Microdiskectomy;  Surgeon: Kristeen Miss, MD;  Location: [place] NEURO ORS;  Service: Neurosurgery;  Laterality: Right;  Right L5-S1 Microdiskectomy  . moles removed that required stiches     one on leg and one on face  . TEMPORAL ARTERY BIOPSY / LIGATION Bilateral 2011  . TONSILLECTOMY AND ADENOIDECTOMY     at age 26  . TUBAL LIGATION  ~ 1982  .  X-STOP IMPLANTATION  ~ 2010   "lower back" (10/28/2012)    Current Outpatient Prescriptions  Medication Sig Dispense Refill  . amLODipine (NORVASC) 5 MG tablet TAKE 1 TABLET (5 MG TOTAL) BY MOUTH DAILY. 90 tablet 2  . calcium carbonate (OS-CAL) 600 MG TABS Take 600 mg by mouth daily.    . cholecalciferol (VITAMIN D) 1000 UNITS tablet Take 1,000 Units by mouth daily.     . clopidogrel (PLAVIX) 75 MG tablet TAKE 1 TABLET BY MOUTH EVERY DAY WITH BREAKFAST 90 tablet 1  . diazepam (VALIUM) 5 MG tablet Take 1 tablet (5 mg total) by mouth every 6 (six) hours as needed for anxiety. 30 tablet 0  . escitalopram (LEXAPRO) 10 MG tablet TAKE 1 TABLET BY MOUTH EVERY DAY (Patient taking differently: TAKE half TABLET BY MOUTH EVERY DAY) 90 tablet 1  . furosemide (LASIX) 20 MG tablet TAKE ONE TABLET DAILY FOR 3 DAYS AND THEN ONE DAILY AS NEEDED 90 tablet 1  . hydrochlorothiazide (MICROZIDE) 12.5 MG capsule TAKE ONE CAPSULE BY MOUTH EVERY DAY 90 capsule 1  . losartan (COZAAR) 100 MG tablet TAKE 1 TABLET BY MOUTH EVERY DAY 90 tablet 3  . metoprolol (LOPRESSOR) 50 MG tablet TAKE 1 TABLET BY MOUTH TWICE A DAY (Patient taking differently: Taking !/2 tablet twice daily) 180 tablet 2  . ondansetron (ZOFRAN ODT) 4 MG disintegrating tablet Take 1 tablet (4 mg total) by mouth every 8 (eight) hours as needed for nausea or vomiting. 12 tablet 0  . oxycodone (OXY-IR) 5 MG capsule Take 1-2 every 6 hours prn severe pain. 30 capsule 0  . potassium chloride (KLOR-CON 10) 10 MEQ tablet TAKE 1 TABLET (10 MEQ TOTAL) BY MOUTH DAILY. 90 tablet 3  . predniSONE (DELTASONE) 10 MG tablet Take 10 mg by mouth. Use as directed    . vitamin C (ASCORBIC ACID) 500 MG tablet Take 1,000 mg by mouth daily as needed (for sore throat).     . vitamin E (VITAMIN E) 400 UNIT capsule Take 400 Units by mouth daily.     Current Facility-Administered Medications  Medication Dose Route Frequency Provider Last Rate Last Dose  . 0.9 %  sodium chloride  infusion   Intravenous Once Eulas Post, MD        Allergies  Allergen Reactions  . Dextromethorphan Rash    Social History   Social History  . Marital status: Married    Spouse name: N/A  . Number of children: 3  . Years of education: N/A   Occupational History  . Retired Cabin crew    Social History Main Topics  . Smoking status: Never Smoker  .  Smokeless tobacco: Never Used  . Alcohol use 0.0 oz/week     Comment: socially  . Drug use: No  . Sexual activity: Not on file   Other Topics Concern  . Not on file   Social History Narrative   Lives in [place] with her husband.  Retired Cabin crew.    Family History  Problem Relation Age of Onset  . Brain cancer Mother 24  . Heart attack Father   . Breast cancer Daughter 47  . Heart disease Other   . Hypertension Other   . Arthritis Neg Hx   . Colon cancer Neg Hx   . Osteoporosis Neg Hx     Review of Systems:  As stated in the HPI and otherwise negative.   BP 110/60   Pulse 75   Ht 5\' 4"  (1.626 m)   Wt 171 lb (77.6 kg)   SpO2 98%   BMI 29.35 kg/m   Physical Examination:  General: Well developed, well nourished, NAD  HEENT: OP clear, mucus membranes moist  SKIN: warm, dry. No rashes. Neuro: No focal deficits  Musculoskeletal: Muscle strength 5/5 all ext  Psychiatric: Mood and affect normal  Neck: No JVD, no carotid bruits, no thyromegaly, no lymphadenopathy.  Lungs:Clear bilaterally, no wheezes, rhonci, crackles Cardiovascular: Regular rate and rhythm. No murmurs, gallops or rubs. Abdomen:Soft. Bowel sounds present. Non-tender.  Extremities: No lower extremity edema. Pulses are 2 + in the bilateral DP/PT.  Cardiac cath 12/09/12: Ao: 150/61  LV: 155/13/18  RA: 6  RV: 25/5/8  PA: 24/10 (mean 16)  PCWP: 9  Fick Cardiac Output: 3.9 L/min  Fick Cardiac Index: 2.3 L/min/m2  Central Aortic Saturation: 98%  Pulmonary Artery Saturation: 69%  Angiographic Findings:  Left main: 10% distal stenosis.    Left Anterior Descending Artery: Large caliber vessel that courses to the apex. The proximal and mid vessel has moderate calcification. The mid vessel has a long 20% stenosis. There are two small caliber diagonal branches with mild plaque disease.  Circumflex Artery: Large caliber vessel with two small caliber obtuse marginal branches. No obstructive disease.  Right Coronary Artery: Large caliber, dominant vessel with a patent stent in the mid vessel. There is minimal stent restenosis. Just beyond the stent there is a 20% stenosis.  Left Ventricular Angiogram: LVEF=65%  Impression:  1. Stable single vessel CAD with patent mid RCA stent  2. Normal LV systolic function  3. Normal PA pressures  Echo August 2014: Left ventricle: The cavity size was normal. Wall thickness was normal. Systolic function was vigorous. The estimated ejection fraction was in the range of 65% to 70%. Wall motion was normal; there were no regional wall motion abnormalities. - Pulmonary arteries: PA peak pressure: 56mm Hg (S).  EKG:  EKG is not ordered today. The ekg ordered today demonstrates  Recent Labs: 07/08/2016: TSH 0.67 11/26/2016: Hemoglobin 11.6; Platelets 229 12/02/2016: BUN 11; Creatinine, Ser 0.72; Potassium 3.9; Sodium 135   Lipid Panel    Component Value Date/Time   CHOL 148 11/13/2013 1403   TRIG 65.0 11/13/2013 1403   HDL 68.70 11/13/2013 1403   CHOLHDL 2 11/13/2013 1403   VLDL 13.0 11/13/2013 1403   LDLCALC 66 11/13/2013 1403     Wt Readings from Last 3 Encounters:  12/14/16 171 lb (77.6 kg)  12/02/16 175 lb 11.2 oz (79.7 kg)  11/27/16 169 lb 1 oz (76.7 kg)     Other studies Reviewed: Additional studies/ records that were reviewed today include: . Review of the  above records demonstrates:    Assessment and Plan:   1. CAD without angina: No chest pain suggestive of angina. She is known to have a Taxus drug eluting stent in her RCA, placed in 2005 in [place]. Cath at [facility] September  2014 with patent RCA stent and mild disease LAD and circumflex. Stress test in 2016 without ischemia. Will continue beta blocker and Plavix. She stopped her statin and refuses to start it again.   2. HTN: BP controlled. No changes today  3. Hyperlipidemia: She has refused to take a statin.  4. Anxiety: I will refill one month of Valium only. She will have to call Dr. Elease Hashimoto for further refills.   Current medicines are reviewed at length with the patient today.  The patient does not have concerns regarding medicines.  The following changes have been made:  no change  Labs/ tests ordered today include:   No orders of the defined types were placed in this encounter.   Disposition:   FU with me in 12  months  Signed, [name], MD 12/14/2016 6:59 PM    [facility] [place] [place], [place], [place]  [address] Phone: [phone]; Fax: [phone]

## 2016-12-14 NOTE — Telephone Encounter (Signed)
I spoke with pt. She reports she has a broken rib. She thinks at last office with Dr. Angelena Form a treadmill was being planned.  I reviewed note and told her I did not see this in notes. I did tell her blood work was going to be arranged.  She is planning on being here for office visit but wanted to let Dr. Angelena Form know she would not be able to walk on the treadmill.

## 2016-12-14 NOTE — Telephone Encounter (Signed)
New message    Pt wants to know if she still should come in if she has a broken rib or if she should wait? Please call.

## 2016-12-14 NOTE — Patient Instructions (Signed)
Medication Instructions:  Your physician recommends that you continue on your current medications as directed. Please refer to the Current Medication list given to you today.   Labwork: none  Testing/Procedures: none  Follow-Up: Your physician recommends that you schedule a follow-up appointment in: 12 months. Please call our office in about 9 months to schedule this appointment.     Any Other Special Instructions Will Be Listed Below (If Applicable).     If you need a refill on your cardiac medications before your next appointment, please call your pharmacy.   

## 2016-12-15 DIAGNOSIS — W08XXXD Fall from other furniture, subsequent encounter: Secondary | ICD-10-CM | POA: Diagnosis not present

## 2016-12-15 DIAGNOSIS — S2231XD Fracture of one rib, right side, subsequent encounter for fracture with routine healing: Secondary | ICD-10-CM | POA: Diagnosis not present

## 2016-12-15 DIAGNOSIS — I10 Essential (primary) hypertension: Secondary | ICD-10-CM | POA: Diagnosis not present

## 2016-12-15 DIAGNOSIS — M48061 Spinal stenosis, lumbar region without neurogenic claudication: Secondary | ICD-10-CM | POA: Diagnosis not present

## 2016-12-15 DIAGNOSIS — I2510 Atherosclerotic heart disease of native coronary artery without angina pectoris: Secondary | ICD-10-CM | POA: Diagnosis not present

## 2016-12-15 DIAGNOSIS — Z9181 History of falling: Secondary | ICD-10-CM | POA: Diagnosis not present

## 2016-12-17 ENCOUNTER — Encounter: Payer: Self-pay | Admitting: Family Medicine

## 2016-12-17 DIAGNOSIS — I2510 Atherosclerotic heart disease of native coronary artery without angina pectoris: Secondary | ICD-10-CM | POA: Diagnosis not present

## 2016-12-17 DIAGNOSIS — W08XXXD Fall from other furniture, subsequent encounter: Secondary | ICD-10-CM | POA: Diagnosis not present

## 2016-12-17 DIAGNOSIS — M48061 Spinal stenosis, lumbar region without neurogenic claudication: Secondary | ICD-10-CM | POA: Diagnosis not present

## 2016-12-17 DIAGNOSIS — I10 Essential (primary) hypertension: Secondary | ICD-10-CM | POA: Diagnosis not present

## 2016-12-17 DIAGNOSIS — S2231XD Fracture of one rib, right side, subsequent encounter for fracture with routine healing: Secondary | ICD-10-CM | POA: Diagnosis not present

## 2016-12-17 DIAGNOSIS — Z9181 History of falling: Secondary | ICD-10-CM | POA: Diagnosis not present

## 2016-12-21 ENCOUNTER — Other Ambulatory Visit: Payer: Self-pay | Admitting: Family Medicine

## 2016-12-21 ENCOUNTER — Telehealth: Payer: Self-pay | Admitting: Family Medicine

## 2016-12-21 NOTE — Telephone Encounter (Signed)
Refill for 6 months. 

## 2016-12-21 NOTE — Telephone Encounter (Signed)
Pt needs new rx oxycodone °

## 2016-12-22 MED ORDER — HYDROCHLOROTHIAZIDE 12.5 MG PO CAPS: 12.5000 mg | ORAL_CAPSULE | Freq: Every day | ORAL | 1 refills | Status: DC

## 2016-12-22 NOTE — Addendum Note (Signed)
Addended by: Westley Hummer B on: 12/22/2016 08:56 AM   Modules accepted: Orders

## 2016-12-22 NOTE — Telephone Encounter (Signed)
Patient is aware 

## 2016-12-22 NOTE — Telephone Encounter (Signed)
I will like her to try to transition over to Tylenol. She was only taking this for acute injury and will need to try to avoid chronic use to avoid dependency

## 2016-12-22 NOTE — Telephone Encounter (Signed)
Last refill 12/11/16.  Okay to fill?

## 2016-12-23 DIAGNOSIS — M48061 Spinal stenosis, lumbar region without neurogenic claudication: Secondary | ICD-10-CM | POA: Diagnosis not present

## 2016-12-23 DIAGNOSIS — I10 Essential (primary) hypertension: Secondary | ICD-10-CM | POA: Diagnosis not present

## 2016-12-23 DIAGNOSIS — W08XXXD Fall from other furniture, subsequent encounter: Secondary | ICD-10-CM | POA: Diagnosis not present

## 2016-12-23 DIAGNOSIS — S2231XD Fracture of one rib, right side, subsequent encounter for fracture with routine healing: Secondary | ICD-10-CM | POA: Diagnosis not present

## 2016-12-23 DIAGNOSIS — Z9181 History of falling: Secondary | ICD-10-CM | POA: Diagnosis not present

## 2016-12-23 DIAGNOSIS — I2510 Atherosclerotic heart disease of native coronary artery without angina pectoris: Secondary | ICD-10-CM | POA: Diagnosis not present

## 2016-12-24 DIAGNOSIS — W08XXXD Fall from other furniture, subsequent encounter: Secondary | ICD-10-CM | POA: Diagnosis not present

## 2016-12-24 DIAGNOSIS — I2510 Atherosclerotic heart disease of native coronary artery without angina pectoris: Secondary | ICD-10-CM | POA: Diagnosis not present

## 2016-12-24 DIAGNOSIS — I10 Essential (primary) hypertension: Secondary | ICD-10-CM | POA: Diagnosis not present

## 2016-12-24 DIAGNOSIS — M48061 Spinal stenosis, lumbar region without neurogenic claudication: Secondary | ICD-10-CM | POA: Diagnosis not present

## 2016-12-24 DIAGNOSIS — S2231XD Fracture of one rib, right side, subsequent encounter for fracture with routine healing: Secondary | ICD-10-CM | POA: Diagnosis not present

## 2016-12-24 DIAGNOSIS — Z9181 History of falling: Secondary | ICD-10-CM | POA: Diagnosis not present

## 2016-12-25 DIAGNOSIS — Z9181 History of falling: Secondary | ICD-10-CM | POA: Diagnosis not present

## 2016-12-25 DIAGNOSIS — I10 Essential (primary) hypertension: Secondary | ICD-10-CM | POA: Diagnosis not present

## 2016-12-25 DIAGNOSIS — S2231XD Fracture of one rib, right side, subsequent encounter for fracture with routine healing: Secondary | ICD-10-CM | POA: Diagnosis not present

## 2016-12-25 DIAGNOSIS — M48061 Spinal stenosis, lumbar region without neurogenic claudication: Secondary | ICD-10-CM | POA: Diagnosis not present

## 2016-12-25 DIAGNOSIS — W08XXXD Fall from other furniture, subsequent encounter: Secondary | ICD-10-CM | POA: Diagnosis not present

## 2016-12-25 DIAGNOSIS — I2510 Atherosclerotic heart disease of native coronary artery without angina pectoris: Secondary | ICD-10-CM | POA: Diagnosis not present

## 2016-12-30 DIAGNOSIS — I10 Essential (primary) hypertension: Secondary | ICD-10-CM | POA: Diagnosis not present

## 2016-12-30 DIAGNOSIS — H01005 Unspecified blepharitis left lower eyelid: Secondary | ICD-10-CM | POA: Diagnosis not present

## 2016-12-30 DIAGNOSIS — W08XXXD Fall from other furniture, subsequent encounter: Secondary | ICD-10-CM | POA: Diagnosis not present

## 2016-12-30 DIAGNOSIS — Z9181 History of falling: Secondary | ICD-10-CM | POA: Diagnosis not present

## 2016-12-30 DIAGNOSIS — I2510 Atherosclerotic heart disease of native coronary artery without angina pectoris: Secondary | ICD-10-CM | POA: Diagnosis not present

## 2016-12-30 DIAGNOSIS — M48061 Spinal stenosis, lumbar region without neurogenic claudication: Secondary | ICD-10-CM | POA: Diagnosis not present

## 2016-12-30 DIAGNOSIS — H01004 Unspecified blepharitis left upper eyelid: Secondary | ICD-10-CM | POA: Diagnosis not present

## 2016-12-30 DIAGNOSIS — H01001 Unspecified blepharitis right upper eyelid: Secondary | ICD-10-CM | POA: Diagnosis not present

## 2016-12-30 DIAGNOSIS — H01002 Unspecified blepharitis right lower eyelid: Secondary | ICD-10-CM | POA: Diagnosis not present

## 2016-12-30 DIAGNOSIS — S2231XD Fracture of one rib, right side, subsequent encounter for fracture with routine healing: Secondary | ICD-10-CM | POA: Diagnosis not present

## 2017-01-06 DIAGNOSIS — S2231XD Fracture of one rib, right side, subsequent encounter for fracture with routine healing: Secondary | ICD-10-CM | POA: Diagnosis not present

## 2017-01-06 DIAGNOSIS — I2510 Atherosclerotic heart disease of native coronary artery without angina pectoris: Secondary | ICD-10-CM | POA: Diagnosis not present

## 2017-01-06 DIAGNOSIS — I10 Essential (primary) hypertension: Secondary | ICD-10-CM | POA: Diagnosis not present

## 2017-01-06 DIAGNOSIS — W08XXXD Fall from other furniture, subsequent encounter: Secondary | ICD-10-CM | POA: Diagnosis not present

## 2017-01-06 DIAGNOSIS — Z9181 History of falling: Secondary | ICD-10-CM | POA: Diagnosis not present

## 2017-01-06 DIAGNOSIS — M48061 Spinal stenosis, lumbar region without neurogenic claudication: Secondary | ICD-10-CM | POA: Diagnosis not present

## 2017-01-12 ENCOUNTER — Telehealth: Payer: Self-pay

## 2017-01-12 MED ORDER — TEMAZEPAM 15 MG PO CAPS: 15.0000 mg | ORAL_CAPSULE | Freq: Every evening | ORAL | 0 refills | Status: DC | PRN

## 2017-01-12 NOTE — Telephone Encounter (Signed)
Pt called to report that her rheum increased her oral pred dose for the next few days. She has not been sleeping d/t the increase. She would like to have a rx for Temazepam 15mg  sent in to the pharmacy. She reports that last time she had her prednisone increased and she was not sleeping well she had an a-fib attack. She would like to have the Temazepam to help her sleep.  Pharmacy [facility] [address]  Dr. Elease Hashimoto - Please advise. Thanks!

## 2017-01-12 NOTE — Telephone Encounter (Signed)
See note

## 2017-01-12 NOTE — Telephone Encounter (Signed)
Refill once #20 one po qhs prn insomnia.

## 2017-01-12 NOTE — Telephone Encounter (Signed)
Rx called in 

## 2017-01-14 DIAGNOSIS — Z23 Encounter for immunization: Secondary | ICD-10-CM | POA: Diagnosis not present

## 2017-01-30 ENCOUNTER — Other Ambulatory Visit: Payer: Self-pay | Admitting: Family Medicine

## 2017-02-02 ENCOUNTER — Other Ambulatory Visit (INDEPENDENT_AMBULATORY_CARE_PROVIDER_SITE_OTHER): Payer: Medicare Other

## 2017-02-02 ENCOUNTER — Other Ambulatory Visit: Payer: Self-pay | Admitting: Family Medicine

## 2017-02-02 ENCOUNTER — Telehealth: Payer: Self-pay | Admitting: Family Medicine

## 2017-02-02 DIAGNOSIS — M316 Other giant cell arteritis: Secondary | ICD-10-CM | POA: Diagnosis not present

## 2017-02-02 NOTE — Telephone Encounter (Signed)
Orders placed.

## 2017-02-02 NOTE — Telephone Encounter (Signed)
° ° ° °  Pt standing order is no longer available. Need to verify that she is to continue having this lab done. If so will need an order   For pt is coming in this after noon and would like to have that lab drawn.

## 2017-02-03 LAB — SEDIMENTATION RATE: SED RATE: 10 mm/h (ref 0–30)

## 2017-02-03 LAB — C-REACTIVE PROTEIN: CRP: 0.3 mg/dL — ABNORMAL LOW (ref 0.5–20.0)

## 2017-02-09 DIAGNOSIS — H01002 Unspecified blepharitis right lower eyelid: Secondary | ICD-10-CM | POA: Diagnosis not present

## 2017-02-09 DIAGNOSIS — H01001 Unspecified blepharitis right upper eyelid: Secondary | ICD-10-CM | POA: Diagnosis not present

## 2017-02-09 DIAGNOSIS — H01004 Unspecified blepharitis left upper eyelid: Secondary | ICD-10-CM | POA: Diagnosis not present

## 2017-02-09 DIAGNOSIS — H01005 Unspecified blepharitis left lower eyelid: Secondary | ICD-10-CM | POA: Diagnosis not present

## 2017-02-23 ENCOUNTER — Other Ambulatory Visit: Payer: Self-pay | Admitting: Family Medicine

## 2017-02-26 DIAGNOSIS — S76311A Strain of muscle, fascia and tendon of the posterior muscle group at thigh level, right thigh, initial encounter: Secondary | ICD-10-CM | POA: Diagnosis not present

## 2017-03-07 ENCOUNTER — Other Ambulatory Visit: Payer: Self-pay | Admitting: Family Medicine

## 2017-03-29 ENCOUNTER — Ambulatory Visit: Payer: Self-pay | Admitting: *Deleted

## 2017-03-29 NOTE — Telephone Encounter (Signed)
Patient has been treating a cough for over 2 weeks- she states it has progressively gotten worse and her congestion is now green. She is afraid she is getting pneumonia and she wants to be seen. Patient is going to try to be seen at [facility] today- if she is not able to be seen today- she has made an appointment for Wednesday. She will cancel it if she is seen. Reason for Disposition . [1] Continuous (nonstop) coughing interferes with work or school AND [2] no improvement using cough treatment per Care Advice  Answer Assessment - Initial Assessment Questions 1. ONSET: "When did the cough begin?"      Couple weeks ago 2. SEVERITY: "How bad is the cough today?"      Itchy scratch throat- and cough for 2 weeks- cough is constant- worse at night 3. RESPIRATORY DISTRESS: "Describe your breathing."      Has not effected breathing- O2 sat 94 4. FEVER: "Do you have a fever?" If so, ask: "What is your temperature, how was it measured, and when did it start?"     no 5. SPUTUM: "Describe the color of your sputum" (clear, white, yellow, green)     Yellow- green 6. HEMOPTYSIS: "Are you coughing up any blood?" If so ask: "How much?" (flecks, streaks, tablespoons, etc.)     no 7. CARDIAC HISTORY: "Do you have any history of heart disease?" (e.g., heart attack, congestive heart failure)      Stint placement 8. LUNG HISTORY: "Do you have any history of lung disease?"  (e.g., pulmonary embolus, asthma, emphysema)     no 9. PE RISK FACTORS: "Do you have a history of blood clots?" (or: recent major surgery, recent prolonged travel, bedridden )     no 10. OTHER SYMPTOMS: "Do you have any other symptoms?" (e.g., runny nose, wheezing, chest pain)       Wheezing 11. PREGNANCY: "Is there any chance you are pregnant?" "When was your last menstrual period?"       n/a  12. TRAVEL: "Have you traveled out of the country in the last month?" (e.g., travel history, exposures)       no  Protocols used: [place]

## 2017-03-31 ENCOUNTER — Ambulatory Visit (INDEPENDENT_AMBULATORY_CARE_PROVIDER_SITE_OTHER): Payer: Medicare Other | Admitting: Family Medicine

## 2017-03-31 ENCOUNTER — Encounter: Payer: Self-pay | Admitting: Family Medicine

## 2017-03-31 VITALS — BP 120/70 | HR 71 | Temp 98.1°F | Wt 164.2 lb

## 2017-03-31 DIAGNOSIS — G4700 Insomnia, unspecified: Secondary | ICD-10-CM

## 2017-03-31 DIAGNOSIS — Z79899 Other long term (current) drug therapy: Secondary | ICD-10-CM | POA: Diagnosis not present

## 2017-03-31 DIAGNOSIS — M316 Other giant cell arteritis: Secondary | ICD-10-CM | POA: Diagnosis not present

## 2017-03-31 DIAGNOSIS — R739 Hyperglycemia, unspecified: Secondary | ICD-10-CM | POA: Diagnosis not present

## 2017-03-31 DIAGNOSIS — R05 Cough: Secondary | ICD-10-CM

## 2017-03-31 DIAGNOSIS — R059 Cough, unspecified: Secondary | ICD-10-CM

## 2017-03-31 LAB — BASIC METABOLIC PANEL
BUN: 10 mg/dL (ref 6–23)
CO2: 26 mEq/L (ref 19–32)
Calcium: 8.9 mg/dL (ref 8.4–10.5)
Chloride: 98 mEq/L (ref 96–112)
Creatinine, Ser: 0.73 mg/dL (ref 0.40–1.20)
GFR: 81.38 mL/min (ref >60.00)
GLUCOSE: 107 mg/dL — AB (ref 70–99)
POTASSIUM: 4.1 meq/L (ref 3.5–5.1)
SODIUM: 137 meq/L (ref 135–145)

## 2017-03-31 LAB — HEMOGLOBIN A1C: Hgb A1c MFr Bld: 5.8 % (ref 4.6–6.5)

## 2017-03-31 LAB — HIGH SENSITIVITY CRP: CRP HIGH SENSITIVITY: 2.04 mg/L (ref 0.000–5.000)

## 2017-03-31 LAB — SEDIMENTATION RATE: Sed Rate: 27 mm/hr (ref 0–30)

## 2017-03-31 MED ORDER — TEMAZEPAM 15 MG PO CAPS: 15.0000 mg | ORAL_CAPSULE | Freq: Every evening | ORAL | 0 refills | Status: DC | PRN

## 2017-03-31 NOTE — Addendum Note (Signed)
Addended by: Tomi Likens on: 03/31/2017 08:35 AM   Modules accepted: Orders

## 2017-03-31 NOTE — Patient Instructions (Addendum)
Cough, Adult Coughing is a reflex that clears your throat and your airways. Coughing helps to heal and protect your lungs. It is normal to cough occasionally, but a cough that happens with other symptoms or lasts a long time may be a sign of a condition that needs treatment. A cough may last only 2-3 weeks (acute), or it may last longer than 8 weeks (chronic). What are the causes? Coughing is commonly caused by:  Breathing in substances that irritate your lungs.  A viral or bacterial respiratory infection.  Allergies.  Asthma.  Postnasal drip.  Smoking.  Acid backing up from the stomach into the esophagus (gastroesophageal reflux).  Certain medicines.  Chronic lung problems, including COPD (or rarely, lung cancer).  Other medical conditions such as heart failure.  Follow these instructions at home: Pay attention to any changes in your symptoms. Take these actions to help with your discomfort:  Take medicines only as told by your health care provider. ? If you were prescribed an antibiotic medicine, take it as told by your health care provider. Do not stop taking the antibiotic even if you start to feel better. ? Talk with your health care provider before you take a cough suppressant medicine.  Drink enough fluid to keep your urine clear or pale yellow.  If the air is dry, use a cold steam vaporizer or humidifier in your bedroom or your home to help loosen secretions.  Avoid anything that causes you to cough at work or at home.  If your cough is worse at night, try sleeping in a semi-upright position.  Avoid cigarette smoke. If you smoke, quit smoking. If you need help quitting, ask your health care provider.  Avoid caffeine.  Avoid alcohol.  Rest as needed.  Contact a health care provider if:  You have new symptoms.  You cough up pus.  Your cough does not get better after 2-3 weeks, or your cough gets worse.  You cannot control your cough with suppressant  medicines and you are losing sleep.  You develop pain that is getting worse or pain that is not controlled with pain medicines.  You have a fever.  You have unexplained weight loss.  You have night sweats. Get help right away if:  You cough up blood.  You have difficulty breathing.  Your heartbeat is very fast. This information is not intended to replace advice given to you by your health care provider. Make sure you discuss any questions you have with your health care provider. Document Released: 09/12/2010 Document Revised: 08/22/2015 Document Reviewed: 05/23/2014 Elsevier Interactive Patient Education  2018 [employer]. Insomnia Insomnia is a sleep disorder that makes it difficult to fall asleep or to stay asleep. Insomnia can cause tiredness (fatigue), low energy, difficulty concentrating, mood swings, and poor performance at work or school. There are three different ways to classify insomnia:  Difficulty falling asleep.  Difficulty staying asleep.  Waking up too early in the morning.  Any type of insomnia can be long-term (chronic) or short-term (acute). Both are common. Short-term insomnia usually lasts for three months or less. Chronic insomnia occurs at least three times a week for longer than three months. What are the causes? Insomnia may be caused by another condition, situation, or substance, such as:  Anxiety.  Certain medicines.  Gastroesophageal reflux disease (GERD) or other gastrointestinal conditions.  Asthma or other breathing conditions.  Restless legs syndrome, sleep apnea, or other sleep disorders.  Chronic pain.  Menopause. This may include hot  flashes.  Stroke.  Abuse of alcohol, tobacco, or illegal drugs.  Depression.  Caffeine.  Neurological disorders, such as Alzheimer disease.  An overactive thyroid (hyperthyroidism).  The cause of insomnia may not be known. What increases the risk? Risk factors for insomnia  include:  Gender. Women are more commonly affected than men.  Age. Insomnia is more common as you get older.  Stress. This may involve your professional or personal life.  Income. Insomnia is more common in people with lower income.  Lack of exercise.  Irregular work schedule or night shifts.  Traveling between different time zones.  What are the signs or symptoms? If you have insomnia, trouble falling asleep or trouble staying asleep is the main symptom. This may lead to other symptoms, such as:  Feeling fatigued.  Feeling nervous about going to sleep.  Not feeling rested in the morning.  Having trouble concentrating.  Feeling irritable, anxious, or depressed.  How is this treated? Treatment for insomnia depends on the cause. If your insomnia is caused by an underlying condition, treatment will focus on addressing the condition. Treatment may also include:  Medicines to help you sleep.  Counseling or therapy.  Lifestyle adjustments.  Follow these instructions at home:  Take medicines only as directed by your health care provider.  Keep regular sleeping and waking hours. Avoid naps.  Keep a sleep diary to help you and your health care provider figure out what could be causing your insomnia. Include: ? When you sleep. ? When you wake up during the night. ? How well you sleep. ? How rested you feel the next day. ? Any side effects of medicines you are taking. ? What you eat and drink.  Make your bedroom a comfortable place where it is easy to fall asleep: ? Put up shades or special blackout curtains to block light from outside. ? Use a white noise machine to block noise. ? Keep the temperature cool.  Exercise regularly as directed by your health care provider. Avoid exercising right before bedtime.  Use relaxation techniques to manage stress. Ask your health care provider to suggest some techniques that may work well for you. These may include: ? Breathing  exercises. ? Routines to release muscle tension. ? Visualizing peaceful scenes.  Cut back on alcohol, caffeinated beverages, and cigarettes, especially close to bedtime. These can disrupt your sleep.  Do not overeat or eat spicy foods right before bedtime. This can lead to digestive discomfort that can make it hard for you to sleep.  Limit screen use before bedtime. This includes: ? Watching TV. ? Using your smartphone, tablet, and computer.  Stick to a routine. This can help you fall asleep faster. Try to do a quiet activity, brush your teeth, and go to bed at the same time each night.  Get out of bed if you are still awake after 15 minutes of trying to sleep. Keep the lights down, but try reading or doing a quiet activity. When you feel sleepy, go back to bed.  Make sure that you drive carefully. Avoid driving if you feel very sleepy.  Keep all follow-up appointments as directed by your health care provider. This is important. Contact a health care provider if:  You are tired throughout the day or have trouble in your daily routine due to sleepiness.  You continue to have sleep problems or your sleep problems get worse. Get help right away if:  You have serious thoughts about hurting yourself or someone else. This  information is not intended to replace advice given to you by your health care provider. Make sure you discuss any questions you have with your health care provider. Document Released: 03/13/2000 Document Revised: 08/16/2015 Document Reviewed: 12/15/2013 Elsevier Interactive Patient Education  [employer].

## 2017-03-31 NOTE — Progress Notes (Signed)
Subjective:     Patient ID: Betty Jackson, female   DOB: 1934/08/24, 83 y.o.   MRN: [id number]  HPI  Patient seen for the following issues  New problem of cough which developed about 2 weeks ago. Initially dry but and productive green sputum and now mostly dry. No dyspnea. No orthopnea. No fevers or chills.  Chronic insomnia. She especially had a bad stretch over the past few weeks. Has taken temazepam in the past as needed we've tried to avoid regular use. She has difficulty falling asleep and staying asleep. Denies depression issues. Tries to avoid caffeine late in the day. No alcohol use regularly.  Patient has hypertension, CAD, history of A. fib. She is on diuretic therapy. Had some intermittent edema but currently stable on diuretics. Needs follow-up electrolytes. She has history of temporal arteritis followed by rheumatology and needs follow-up CRP and sedimentation rate.  Past Medical History:  Diagnosis Date  . Anxiety    "more so the last few days" (10/28/2012)  . Anxiety state, unspecified 10/28/2012  . Arthritis     Right hemiarthroplasty after hip fracture 06/2011  . Arthritis    "hands" (10/28/2012)  . Bruises easily    d/t being on prednisone and plavix  . CAD (coronary artery disease)    a. Stent RCA in [facility];  b. Cath approx 2009 - nonobs per pt report.  . Cataract    immature on the left eye  . Chronic lower back pain    scoliosis  . Complication of anesthesia    pt has a very high tolerance to meds  . DDD (degenerative disc disease)   . Depression   . Diverticulitis    hx of  . Diverticulosis   . Enteritis   . Exertional shortness of breath    "every now and then" (10/28/2012)  . Giant cell arteritis ([place])   . Hemorrhoids   . History of scabies   . Hyperlipidemia    takes Lipitor daily  . Hypertension    takes Amlodipine,Losartan,Metoprolol,and HCTZ daily  . Incontinence of urine   . Insomnia    takes Restoril nightly  . Joint pain   . Migraines     "back in my 20's; none since" (10/28/2012)  . Osteopenia   . Osteoporosis   . PAF (paroxysmal atrial fibrillation) ([place])    a. lone epidode in 2011 according to notes.  . Scoliosis   . Sinus bradycardia    a. on chronic bb  . Temporal arteritis ([place]) 2011   a. followed @ [facility]; potential flareup 10/28/2012/notes 10/28/2012  . Urinary frequency   . Vocal cord dysfunction    "they don't operate properly" (10/28/2012)   Past Surgical History:  Procedure Laterality Date  . ABDOMINAL HYSTERECTOMY  ~ 1984   vaginally  . BACK SURGERY  7-35yrs ago   X Stop  . BLADDER SUSPENSION  2001  . BREAST BIOPSY Right   . CATARACT EXTRACTION W/ INTRAOCULAR LENS IMPLANT Right ~ 08/2012  . COLONOSCOPY  01/26/2012   Procedure: COLONOSCOPY;  Surgeon: Ladene Artist, MD,FACG;  Location: [facility] ENDOSCOPY;  Service: Endoscopy;  Laterality: N/A;  note the EGD is possible  . CORONARY ANGIOPLASTY WITH STENT PLACEMENT  2006   X 1 stent  . EPIDURAL BLOCK INJECTION    . ESOPHAGOGASTRODUODENOSCOPY  01/26/2012   Procedure: ESOPHAGOGASTRODUODENOSCOPY (EGD);  Surgeon: Ladene Artist, MD,FACG;  Location: [facility] ENDOSCOPY;  Service: Endoscopy;  Laterality: N/A;  . HEMIARTHROPLASTY HIP Right 2012  .  LAPAROSCOPIC CHOLECYSTECTOMY  2001  . LUMBAR FUSION  03/2013  . LUMBAR LAMINECTOMY/DECOMPRESSION MICRODISCECTOMY Right 11/04/2015   Procedure: Right Lumbar Five-Sacral One Microdiskectomy;  Surgeon: Kristeen Miss, MD;  Location: [facility]O [facility]S;  Service: Neurosurgery;  Laterality: Right;  Right L5-S1 Microdiskectomy  . moles removed that required stiches     one on leg and one on face  . TEMPORAL ARTERY BIOPSY / LIGATION Bilateral 2011  . TONSILLECTOMY AND ADENOIDECTOMY     at age 3  . TUBAL LIGATION  ~ 1982  . X-STOP IMPLANTATION  ~ 2010   "lower back" (10/28/2012)    reports that  has never smoked. she has never used smokeless tobacco. She reports that she drinks alcohol. She reports that she does not use drugs. family history  includes Brain cancer (age of onset: 7) in her mother; Breast cancer (age of onset: 4) in her daughter; Heart attack in her father; Heart disease in her other; Hypertension in her other. Allergies  Allergen Reactions  . Dextromethorphan Rash    Review of Systems  Constitutional: Negative for fatigue and fever.  Eyes: Negative for visual disturbance.  Respiratory: Positive for cough. Negative for chest tightness, shortness of breath and wheezing.   Cardiovascular: Negative for chest pain, palpitations and leg swelling.  Neurological: Negative for dizziness, seizures, syncope, weakness, light-headedness and headaches.  Psychiatric/Behavioral: Positive for sleep disturbance. Negative for confusion and dysphoric mood.       Objective:   Physical Exam  Constitutional: She appears well-developed and well-nourished.  Eyes: Pupils are equal, round, and reactive to light.  Neck: Neck supple. No JVD present. No thyromegaly present.  Cardiovascular: Normal rate and regular rhythm. Exam reveals no gallop.  Pulmonary/Chest: Effort normal and breath sounds normal. No respiratory distress. She has no wheezes. She has no rales.  Musculoskeletal: She exhibits no edema.  Neurological: She is alert.       Assessment:     #1 cough. Suspect acute viral bronchitis. Nonfocal exam  #2 chronic intermittent insomnia  #3 history of temporal arteritis  #4 hypertension stable on chronic diuretic therapy    Plan:     -Check labs with basic metabolic panel, sedimentation rate, CRP, hemoglobin A1c with past history of hyperglycemia on prednisone -Sleep hygiene discussed with handout given -Avoid regular use of benzodiazepines. We agreed to one refill Restoril 15 mg daily at bedtime when necessary #20 with no refill -Follow-up immediately for any shortness of breath or fever  Eulas Post MD [facility]

## 2017-04-02 ENCOUNTER — Telehealth: Payer: Self-pay | Admitting: Family Medicine

## 2017-04-02 ENCOUNTER — Ambulatory Visit: Payer: Self-pay

## 2017-04-02 NOTE — Telephone Encounter (Signed)
Copied from [place]. Topic: Inquiry >> Apr 02, 2017  5:04 PM Cecelia Byars, NT wrote: Reason for CRM: Patient would like a return call concerning a new prescription being called in for her today for her cough it is worse .

## 2017-04-02 NOTE — Telephone Encounter (Signed)
  Reason for Disposition . Cough  Answer Assessment - Initial Assessment Questions 1. ONSET: "When did the cough begin?"      Started 2 weeks ago 2. SEVERITY: "How bad is the cough today?"      Severe 3. RESPIRATORY DISTRESS: "Describe your breathing."      No 4. FEVER: "Do you have a fever?" If so, ask: "What is your temperature, how was it measured, and when did it start?"     No 5. SPUTUM: "Describe the color of your sputum" (clear, white, yellow, green)     yelloe-green 6. HEMOPTYSIS: "Are you coughing up any blood?" If so ask: "How much?" (flecks, streaks, tablespoons, etc.)     Small amount 7. CARDIAC HISTORY: "Do you have any history of heart disease?" (e.g., heart attack, congestive heart failure)      Stent 8. LUNG HISTORY: "Do you have any history of lung disease?"  (e.g., pulmonary embolus, asthma, emphysema)     No 9. PE RISK FACTORS: "Do you have a history of blood clots?" (or: recent major surgery, recent prolonged travel, bedridden )     No 10. OTHER SYMPTOMS: "Do you have any other symptoms?" (e.g., runny nose, wheezing, chest pain)       Wheezing some 11. PREGNANCY: "Is there any chance you are pregnant?" "When was your last menstrual period?"       No 12. TRAVEL: "Have you traveled out of the country in the last month?" (e.g., travel history, exposures)       No  Protocols used: [place] Pt. Reports her cough is worse and she feels "worse." Request cough medicine be called in.

## 2017-04-02 NOTE — Telephone Encounter (Signed)
Please Advise

## 2017-04-02 NOTE — Telephone Encounter (Signed)
Copied from [place] [id number]. Topic: Inquiry >> Apr 02, 2017  5:13 PM Cecelia Byars, NT wrote: Reason for TTS:[id number] called to follow up and check the status on a request for  a prescription fo cough medicine please advise

## 2017-04-02 NOTE — Telephone Encounter (Signed)
May try Tessalon Perles 100 mg po q 8 hours prn cough #30

## 2017-04-03 DIAGNOSIS — R0982 Postnasal drip: Secondary | ICD-10-CM | POA: Diagnosis not present

## 2017-04-03 DIAGNOSIS — J069 Acute upper respiratory infection, unspecified: Secondary | ICD-10-CM | POA: Diagnosis not present

## 2017-04-03 DIAGNOSIS — J3489 Other specified disorders of nose and nasal sinuses: Secondary | ICD-10-CM | POA: Diagnosis not present

## 2017-04-03 DIAGNOSIS — R05 Cough: Secondary | ICD-10-CM | POA: Diagnosis not present

## 2017-04-05 ENCOUNTER — Telehealth: Payer: Self-pay | Admitting: *Deleted

## 2017-04-05 NOTE — Telephone Encounter (Signed)
May refill Hydromet one tsp po q 6 hours prn cough #120 ml.

## 2017-04-05 NOTE — Telephone Encounter (Signed)
Patient went to urgent care and received medication

## 2017-04-05 NOTE — Telephone Encounter (Signed)
Pt states that she went to another clinic on Friday and she was prescribed Tessalon pearls, pt states that the pearls are not working and she is requesting for Hydromet which she states that she has taken before and worked well with her cough. Please Advise.

## 2017-04-05 NOTE — Telephone Encounter (Signed)
Per 04/02/17 phone note, patient went to urgent care and received medication on 04/05/17.

## 2017-04-05 NOTE — Telephone Encounter (Signed)
Patient Name: Betty Jackson Gender: Female DOB: 1935-02-26 Age: 83 Y 42 M 26 D Return Phone Number: [phone] (Primary), [phone] (Secondary) Address: City/State/Zip: [place] [address] Client [facility] Night - Client Client Site [facility] - Night Physician Carolann Littler - MD Contact Type Call Who Is Calling Patient / Member / Family / Caregiver Call Type Triage / Clinical Caller Name Melissa Vogelsinger Relationship To Patient Daughter Return Phone Number [phone] (Primary) Chief Complaint Coughing Up Blood Reason for Call Symptomatic / Request for Health Information Initial Comment mother cough over a week, phlegm and some blood Translation No Nurse Assessment Nurse: Cherie Dark, RN, Jarrett Soho Date/Time (Eastern Time): 04/03/2017 9:28:47 AM Confirm and document reason for call. If symptomatic, describe symptoms. ---Caller states her mom has had a cough for over a week and has coughed up some phlegm and some blood. She is in pain and her throat is raw. She is coughing up lots of phlegm. She seems worse. Was not put on any meds at the office visit. Would like an antibiotic and cough medication. Does the patient have any new or worsening symptoms? ---Yes Will a triage be completed? ---Yes Related visit to physician within the last 2 weeks? ---Yes Does the PT have any chronic conditions? (i.e. diabetes, asthma, etc.) ---Yes List chronic conditions. ---HTN, takes prednisone on a daily basis, Is this a behavioral health or substance abuse call? ---No Guidelines Guideline Title Affirmed Question Affirmed Notes Nurse Date/Time (Eastern Time) Coughing Up Blood [1] Nasal discharge AND [2] present > 10 days [place], RN, Jarrett Soho 04/03/2017 9:28:57 AM Disp. Time Eilene Ghazi Time) Disposition Final User 04/03/2017 9:49:03 AM See Physician within 24 Hours Yes [place], RN, Jarrett Soho

## 2017-04-06 ENCOUNTER — Other Ambulatory Visit: Payer: Self-pay

## 2017-04-06 MED ORDER — HYDROCODONE-HOMATROPINE 5-1.5 MG/5ML PO SYRP: ORAL_SOLUTION | ORAL | 0 refills | Status: DC

## 2017-04-06 NOTE — Telephone Encounter (Signed)
Rx has been signed and pt is aware to pick the Rx in the office.

## 2017-04-13 DIAGNOSIS — Z79899 Other long term (current) drug therapy: Secondary | ICD-10-CM | POA: Diagnosis not present

## 2017-04-13 DIAGNOSIS — M4316 Spondylolisthesis, lumbar region: Secondary | ICD-10-CM | POA: Diagnosis not present

## 2017-04-25 ENCOUNTER — Other Ambulatory Visit: Payer: Self-pay | Admitting: Family Medicine

## 2017-05-20 ENCOUNTER — Ambulatory Visit: Payer: Self-pay

## 2017-05-20 ENCOUNTER — Other Ambulatory Visit: Payer: Self-pay

## 2017-05-20 ENCOUNTER — Inpatient Hospital Stay (HOSPITAL_COMMUNITY)
Admission: EM | Admit: 2017-05-20 | Discharge: 2017-05-26 | DRG: 871 | Disposition: A | Discharge status: Home Health | Payer: Medicare Other | Attending: Internal Medicine | Admitting: Internal Medicine

## 2017-05-20 ENCOUNTER — Inpatient Hospital Stay (HOSPITAL_COMMUNITY): Payer: Medicare Other

## 2017-05-20 ENCOUNTER — Emergency Department (HOSPITAL_COMMUNITY): Payer: Medicare Other

## 2017-05-20 ENCOUNTER — Encounter (HOSPITAL_COMMUNITY): Payer: Self-pay | Admitting: Nurse Practitioner

## 2017-05-20 DIAGNOSIS — R918 Other nonspecific abnormal finding of lung field: Secondary | ICD-10-CM | POA: Diagnosis not present

## 2017-05-20 DIAGNOSIS — I130 Hypertensive heart and chronic kidney disease with heart failure and stage 1 through stage 4 chronic kidney disease, or unspecified chronic kidney disease: Secondary | ICD-10-CM | POA: Diagnosis present

## 2017-05-20 DIAGNOSIS — G8929 Other chronic pain: Secondary | ICD-10-CM | POA: Diagnosis present

## 2017-05-20 DIAGNOSIS — K5730 Diverticulosis of large intestine without perforation or abscess without bleeding: Secondary | ICD-10-CM | POA: Diagnosis not present

## 2017-05-20 DIAGNOSIS — Z981 Arthrodesis status: Secondary | ICD-10-CM

## 2017-05-20 DIAGNOSIS — J101 Influenza due to other identified influenza virus with other respiratory manifestations: Secondary | ICD-10-CM | POA: Diagnosis present

## 2017-05-20 DIAGNOSIS — N179 Acute kidney failure, unspecified: Secondary | ICD-10-CM | POA: Diagnosis present

## 2017-05-20 DIAGNOSIS — I10 Essential (primary) hypertension: Secondary | ICD-10-CM | POA: Diagnosis present

## 2017-05-20 DIAGNOSIS — R6520 Severe sepsis without septic shock: Secondary | ICD-10-CM | POA: Diagnosis present

## 2017-05-20 DIAGNOSIS — N390 Urinary tract infection, site not specified: Secondary | ICD-10-CM | POA: Diagnosis present

## 2017-05-20 DIAGNOSIS — I2510 Atherosclerotic heart disease of native coronary artery without angina pectoris: Secondary | ICD-10-CM | POA: Diagnosis present

## 2017-05-20 DIAGNOSIS — A419 Sepsis, unspecified organism: Principal | ICD-10-CM | POA: Diagnosis present

## 2017-05-20 DIAGNOSIS — M549 Dorsalgia, unspecified: Secondary | ICD-10-CM | POA: Diagnosis present

## 2017-05-20 DIAGNOSIS — M8580 Other specified disorders of bone density and structure, unspecified site: Secondary | ICD-10-CM | POA: Diagnosis present

## 2017-05-20 DIAGNOSIS — R531 Weakness: Secondary | ICD-10-CM | POA: Diagnosis not present

## 2017-05-20 DIAGNOSIS — I5032 Chronic diastolic (congestive) heart failure: Secondary | ICD-10-CM | POA: Diagnosis present

## 2017-05-20 DIAGNOSIS — E876 Hypokalemia: Secondary | ICD-10-CM | POA: Diagnosis present

## 2017-05-20 DIAGNOSIS — E785 Hyperlipidemia, unspecified: Secondary | ICD-10-CM | POA: Diagnosis present

## 2017-05-20 DIAGNOSIS — I4891 Unspecified atrial fibrillation: Secondary | ICD-10-CM | POA: Diagnosis not present

## 2017-05-20 DIAGNOSIS — M810 Age-related osteoporosis without current pathological fracture: Secondary | ICD-10-CM | POA: Diagnosis present

## 2017-05-20 DIAGNOSIS — R8290 Unspecified abnormal findings in urine: Secondary | ICD-10-CM | POA: Diagnosis present

## 2017-05-20 DIAGNOSIS — Z888 Allergy status to other drugs, medicaments and biological substances status: Secondary | ICD-10-CM

## 2017-05-20 DIAGNOSIS — N183 Chronic kidney disease, stage 3 unspecified: Secondary | ICD-10-CM | POA: Diagnosis present

## 2017-05-20 DIAGNOSIS — J383 Other diseases of vocal cords: Secondary | ICD-10-CM | POA: Diagnosis present

## 2017-05-20 DIAGNOSIS — J9601 Acute respiratory failure with hypoxia: Secondary | ICD-10-CM | POA: Diagnosis not present

## 2017-05-20 DIAGNOSIS — F419 Anxiety disorder, unspecified: Secondary | ICD-10-CM | POA: Diagnosis present

## 2017-05-20 DIAGNOSIS — F329 Major depressive disorder, single episode, unspecified: Secondary | ICD-10-CM | POA: Diagnosis present

## 2017-05-20 DIAGNOSIS — E860 Dehydration: Secondary | ICD-10-CM | POA: Diagnosis present

## 2017-05-20 DIAGNOSIS — M316 Other giant cell arteritis: Secondary | ICD-10-CM | POA: Diagnosis not present

## 2017-05-20 DIAGNOSIS — Z7952 Long term (current) use of systemic steroids: Secondary | ICD-10-CM

## 2017-05-20 DIAGNOSIS — Z66 Do not resuscitate: Secondary | ICD-10-CM | POA: Diagnosis present

## 2017-05-20 DIAGNOSIS — I480 Paroxysmal atrial fibrillation: Secondary | ICD-10-CM | POA: Diagnosis not present

## 2017-05-20 DIAGNOSIS — Z79899 Other long term (current) drug therapy: Secondary | ICD-10-CM

## 2017-05-20 DIAGNOSIS — Z955 Presence of coronary angioplasty implant and graft: Secondary | ICD-10-CM | POA: Diagnosis not present

## 2017-05-20 DIAGNOSIS — Z7902 Long term (current) use of antithrombotics/antiplatelets: Secondary | ICD-10-CM

## 2017-05-20 DIAGNOSIS — R404 Transient alteration of awareness: Secondary | ICD-10-CM | POA: Diagnosis not present

## 2017-05-20 HISTORY — DX: Acute respiratory failure with hypoxia: J96.01

## 2017-05-20 HISTORY — DX: Sepsis, unspecified organism: A41.9

## 2017-05-20 HISTORY — DX: Chronic kidney disease, stage 3 (moderate): N18.3

## 2017-05-20 HISTORY — DX: Rheumatoid arthritis, unspecified: M06.9

## 2017-05-20 HISTORY — DX: Essential (primary) hypertension: I10

## 2017-05-20 HISTORY — DX: Chronic kidney disease, stage 3 unspecified: N18.30

## 2017-05-20 LAB — CBC WITH DIFFERENTIAL/PLATELET
BASOS ABS: 0 10*3/uL (ref 0.0–0.1)
Basophils Relative: 0 %
Eosinophils Absolute: 0 10*3/uL (ref 0.0–0.7)
Eosinophils Relative: 0 %
HEMATOCRIT: 37 % (ref 36.0–46.0)
HEMOGLOBIN: 12.2 g/dL (ref 12.0–15.0)
Lymphocytes Relative: 14 %
Lymphs Abs: 1.5 10*3/uL (ref 0.7–4.0)
MCH: 30.7 pg (ref 26.0–34.0)
MCHC: 33 g/dL (ref 30.0–36.0)
MCV: 93 fL (ref 78.0–100.0)
Monocytes Absolute: 1.1 10*3/uL — ABNORMAL HIGH (ref 0.1–1.0)
Monocytes Relative: 10 %
NEUTROS ABS: 8.2 10*3/uL — AB (ref 1.7–7.7)
NEUTROS PCT: 76 %
Platelets: 170 10*3/uL (ref 150–400)
RBC: 3.98 MIL/uL (ref 3.87–5.11)
RDW: 13.8 % (ref 11.5–15.5)
WBC: 10.8 10*3/uL — AB (ref 4.0–10.5)

## 2017-05-20 LAB — COMPREHENSIVE METABOLIC PANEL
ALT: 22 U/L (ref 14–54)
ANION GAP: 13 (ref 5–15)
AST: 32 U/L (ref 15–41)
Albumin: 3.7 g/dL (ref 3.5–5.0)
Alkaline Phosphatase: 63 U/L (ref 38–126)
BILIRUBIN TOTAL: 0.8 mg/dL (ref 0.3–1.2)
BUN: 9 mg/dL (ref 6–20)
CALCIUM: 8.7 mg/dL — AB (ref 8.9–10.3)
CO2: 26 mmol/L (ref 22–32)
Chloride: 96 mmol/L — ABNORMAL LOW (ref 101–111)
Creatinine, Ser: 1.09 mg/dL — ABNORMAL HIGH (ref 0.44–1.00)
GFR, EST AFRICAN AMERICAN: 54 mL/min — AB (ref >60)
GFR, EST NON AFRICAN AMERICAN: 47 mL/min — AB (ref >60)
Glucose, Bld: 109 mg/dL — ABNORMAL HIGH (ref 65–99)
Potassium: 3.2 mmol/L — ABNORMAL LOW (ref 3.5–5.1)
SODIUM: 135 mmol/L (ref 135–145)
TOTAL PROTEIN: 6.2 g/dL — AB (ref 6.5–8.1)

## 2017-05-20 LAB — URINALYSIS, ROUTINE W REFLEX MICROSCOPIC
BILIRUBIN URINE: NEGATIVE
Glucose, UA: NEGATIVE mg/dL
KETONES UR: NEGATIVE mg/dL
NITRITE: POSITIVE — AB
PH: 5 (ref 5.0–8.0)
Protein, ur: 30 mg/dL — AB
Specific Gravity, Urine: 1.041 — ABNORMAL HIGH (ref 1.005–1.030)
Squamous Epithelial / LPF: NONE SEEN

## 2017-05-20 LAB — LACTIC ACID, PLASMA
LACTIC ACID, VENOUS: 1.3 mmol/L (ref 0.5–1.9)
Lactic Acid, Venous: 1.2 mmol/L (ref 0.5–1.9)

## 2017-05-20 LAB — I-STAT CG4 LACTIC ACID, ED
Lactic Acid, Venous: 2.1 mmol/L (ref 0.5–1.9)
Lactic Acid, Venous: 2.29 mmol/L (ref 0.5–1.9)

## 2017-05-20 LAB — PROCALCITONIN: Procalcitonin: 1.46 ng/mL

## 2017-05-20 LAB — CORTISOL: Cortisol, Plasma: 2.3 ug/dL

## 2017-05-20 LAB — INFLUENZA PANEL BY PCR (TYPE A & B)
INFLAPCR: POSITIVE — AB
Influenza B By PCR: NEGATIVE

## 2017-05-20 LAB — LIPASE, BLOOD: LIPASE: 38 U/L (ref 11–51)

## 2017-05-20 MED ORDER — ONDANSETRON HCL 4 MG/2ML IJ SOLN
4.0000 mg | Freq: Once | INTRAMUSCULAR | Status: AC
  Administered 2017-05-20: 4 mg via INTRAVENOUS
  Filled 2017-05-20: qty 2

## 2017-05-20 MED ORDER — IBUPROFEN 400 MG PO TABS: 600.0000 mg | ORAL_TABLET | Freq: Once | ORAL | Status: DC

## 2017-05-20 MED ORDER — SODIUM CHLORIDE 0.9 % IV BOLUS (SEPSIS)
1000.0000 mL | Freq: Once | INTRAVENOUS | Status: AC
  Administered 2017-05-20: 1000 mL via INTRAVENOUS

## 2017-05-20 MED ORDER — HYDROCODONE-ACETAMINOPHEN 5-325 MG PO TABS
1.0000 | ORAL_TABLET | Freq: Once | ORAL | Status: AC
  Administered 2017-05-21: 1 via ORAL
  Filled 2017-05-20: qty 1

## 2017-05-20 MED ORDER — PIPERACILLIN-TAZOBACTAM 3.375 G IVPB 30 MIN
3.3750 g | Freq: Once | INTRAVENOUS | Status: AC
  Administered 2017-05-20: 3.375 g via INTRAVENOUS
  Filled 2017-05-20: qty 50

## 2017-05-20 MED ORDER — ACETAMINOPHEN 650 MG RE SUPP
975.0000 mg | Freq: Once | RECTAL | Status: AC
  Administered 2017-05-20: 975 mg via RECTAL
  Filled 2017-05-20: qty 1

## 2017-05-20 MED ORDER — IOPAMIDOL (ISOVUE-300) INJECTION 61%
INTRAVENOUS | Status: AC
  Administered 2017-05-20: 80 mL
  Filled 2017-05-20: qty 100

## 2017-05-20 MED ORDER — OSELTAMIVIR PHOSPHATE 75 MG PO CAPS: 75.0000 mg | ORAL_CAPSULE | Freq: Once | ORAL | Status: DC

## 2017-05-20 MED ORDER — ACETAMINOPHEN 650 MG RE SUPP: 650.0000 mg | Freq: Four times a day (QID) | RECTAL | Status: DC | PRN

## 2017-05-20 MED ORDER — FLUTICASONE PROPIONATE 50 MCG/ACT NA SUSP
1.0000 | Freq: Every day | NASAL | Status: DC
  Administered 2017-05-24 – 2017-05-26 (×3): 1 via NASAL
  Filled 2017-05-20: qty 16

## 2017-05-20 MED ORDER — PROMETHAZINE HCL 25 MG/ML IJ SOLN: 6.2500 mg | Freq: Four times a day (QID) | INTRAMUSCULAR | Status: DC | PRN

## 2017-05-20 MED ORDER — SODIUM CHLORIDE 0.9 % IV SOLN
INTRAVENOUS | Status: DC
  Administered 2017-05-20: 15:00:00 via INTRAVENOUS

## 2017-05-20 MED ORDER — ACETAMINOPHEN 10 MG/ML IV SOLN
1000.0000 mg | Freq: Once | INTRAVENOUS | Status: AC
  Administered 2017-05-20: 1000 mg via INTRAVENOUS
  Filled 2017-05-20: qty 100

## 2017-05-20 MED ORDER — POTASSIUM CHLORIDE 10 MEQ/100ML IV SOLN
10.0000 meq | INTRAVENOUS | Status: AC
  Administered 2017-05-20 (×3): 10 meq via INTRAVENOUS
  Filled 2017-05-20 (×3): qty 100

## 2017-05-20 MED ORDER — ENOXAPARIN SODIUM 40 MG/0.4ML ~~LOC~~ SOLN
40.0000 mg | SUBCUTANEOUS | Status: DC
  Administered 2017-05-20 – 2017-05-25 (×6): 40 mg via SUBCUTANEOUS
  Filled 2017-05-20 (×6): qty 0.4

## 2017-05-20 MED ORDER — VANCOMYCIN HCL IN DEXTROSE 1-5 GM/200ML-% IV SOLN: 1000.0000 mg | INTRAVENOUS | Status: DC

## 2017-05-20 MED ORDER — POTASSIUM CHLORIDE IN NACL 20-0.9 MEQ/L-% IV SOLN
INTRAVENOUS | Status: DC
  Administered 2017-05-20 – 2017-05-21 (×2): via INTRAVENOUS
  Administered 2017-05-21 (×2): 1000 mL via INTRAVENOUS
  Administered 2017-05-22 (×2): via INTRAVENOUS
  Filled 2017-05-20 (×6): qty 1000

## 2017-05-20 MED ORDER — OSELTAMIVIR PHOSPHATE 30 MG PO CAPS
30.0000 mg | ORAL_CAPSULE | Freq: Two times a day (BID) | ORAL | Status: AC
  Administered 2017-05-20 – 2017-05-25 (×10): 30 mg via ORAL
  Filled 2017-05-20 (×10): qty 1

## 2017-05-20 MED ORDER — SODIUM CHLORIDE 0.9 % IV SOLN
1.0000 g | INTRAVENOUS | Status: DC
  Administered 2017-05-20 – 2017-05-24 (×4): 1 g via INTRAVENOUS
  Filled 2017-05-20 (×5): qty 10

## 2017-05-20 MED ORDER — ZOLPIDEM TARTRATE 5 MG PO TABS
5.0000 mg | ORAL_TABLET | Freq: Once | ORAL | Status: AC
  Administered 2017-05-21: 5 mg via ORAL
  Filled 2017-05-20: qty 1

## 2017-05-20 MED ORDER — SODIUM CHLORIDE 0.9 % IV SOLN
500.0000 mg | INTRAVENOUS | Status: DC
  Administered 2017-05-21 – 2017-05-24 (×4): 500 mg via INTRAVENOUS
  Filled 2017-05-20 (×4): qty 500

## 2017-05-20 MED ORDER — KETOROLAC TROMETHAMINE 15 MG/ML IJ SOLN
15.0000 mg | Freq: Once | INTRAMUSCULAR | Status: AC
  Administered 2017-05-20: 15 mg via INTRAVENOUS
  Filled 2017-05-20: qty 1

## 2017-05-20 MED ORDER — SODIUM CHLORIDE 0.9 % IV BOLUS (SEPSIS)
500.0000 mL | Freq: Once | INTRAVENOUS | Status: AC
  Administered 2017-05-20: 500 mL via INTRAVENOUS

## 2017-05-20 MED ORDER — SODIUM CHLORIDE 0.9% FLUSH
3.0000 mL | Freq: Two times a day (BID) | INTRAVENOUS | Status: DC
  Administered 2017-05-20 – 2017-05-26 (×11): 3 mL via INTRAVENOUS

## 2017-05-20 MED ORDER — PIPERACILLIN-TAZOBACTAM 3.375 G IVPB: 3.3750 g | Freq: Three times a day (TID) | INTRAVENOUS | Status: DC

## 2017-05-20 MED ORDER — CLOPIDOGREL BISULFATE 75 MG PO TABS
75.0000 mg | ORAL_TABLET | Freq: Every day | ORAL | Status: DC
  Administered 2017-05-21 – 2017-05-26 (×6): 75 mg via ORAL
  Filled 2017-05-20 (×6): qty 1

## 2017-05-20 MED ORDER — ACETAMINOPHEN 500 MG PO TABS: 1000.0000 mg | ORAL_TABLET | Freq: Once | ORAL | Status: DC

## 2017-05-20 MED ORDER — ACETAMINOPHEN 325 MG PO TABS
650.0000 mg | ORAL_TABLET | Freq: Four times a day (QID) | ORAL | Status: DC | PRN
  Administered 2017-05-20: 650 mg via ORAL
  Filled 2017-05-20: qty 2

## 2017-05-20 MED ORDER — HYDROCORTISONE NA SUCCINATE PF 100 MG IJ SOLR
50.0000 mg | Freq: Three times a day (TID) | INTRAMUSCULAR | Status: DC
  Administered 2017-05-21 – 2017-05-25 (×13): 50 mg via INTRAVENOUS
  Filled 2017-05-20 (×14): qty 2

## 2017-05-20 MED ORDER — ONDANSETRON HCL 4 MG/2ML IJ SOLN
4.0000 mg | Freq: Four times a day (QID) | INTRAMUSCULAR | Status: DC | PRN
  Administered 2017-05-20: 4 mg via INTRAVENOUS
  Filled 2017-05-20: qty 2

## 2017-05-20 MED ORDER — VANCOMYCIN HCL IN DEXTROSE 1-5 GM/200ML-% IV SOLN
1000.0000 mg | Freq: Once | INTRAVENOUS | Status: AC
  Administered 2017-05-20: 1000 mg via INTRAVENOUS
  Filled 2017-05-20: qty 200

## 2017-05-20 MED ORDER — PREDNISONE 20 MG PO TABS: 10.0000 mg | ORAL_TABLET | Freq: Every day | ORAL | Status: DC

## 2017-05-20 NOTE — Telephone Encounter (Signed)
Monitor for ER arrival 

## 2017-05-20 NOTE — ED Triage Notes (Signed)
Patient c/o n/v x 1 day with progressive weakness. Patient lives at home alone and is unable toi use her walker x 1 day.

## 2017-05-20 NOTE — Telephone Encounter (Signed)
Daughter reports pt. Is on the floor now - can not get up. Instructed to call 911 now. Reason for Disposition . Sounds like a life-threatening emergency to the triager  Answer Assessment - Initial Assessment Questions 1. VOMITING SEVERITY: "How many times have you vomited in the past 24 hours?"     - MILD:  1 - 2 times/day    - MODERATE: 3 - 5 times/day, decreased oral intake without significant weight loss or symptoms of dehydration    - SEVERE: 6 or more times/day, vomits everything or nearly everything, with significant weight loss, symptoms of dehydration      Daughter is unsure 2. ONSET: "When did the vomiting begin?"      Last night 3. FLUIDS: "What fluids or food have you vomited up today?" "Have you been able to keep any fluids down?"     Daughter is not sure 4. ABDOMINAL PAIN: "Are your having any abdominal pain?" If yes : "How bad is it and what does it feel like?" (e.g., crampy, dull, intermittent, constant)      Unsure 5. DIARRHEA: "Is there any diarrhea?" If so, ask: "How many times today?"      No 6. CONTACTS: "Is there anyone else in the family with the same symptoms?"      No 7. CAUSE: "What do you think is causing your vomiting?"     Unsure 8. HYDRATION STATUS: "Any signs of dehydration?" (e.g., dry mouth [not only dry lips], too weak to stand) "When did you last urinate?"     Too weak too stand 9. OTHER SYMPTOMS: "Do you have any other symptoms?" (e.g., fever, headache, vertigo, vomiting blood or coffee grounds, recent head injury)     Congestion 10. PREGNANCY: "Is there any chance you are pregnant?" "When was your last menstrual period?"       No  Protocols used: [facility]

## 2017-05-20 NOTE — ED Notes (Signed)
Called for a hospital bed per RN Larkin Ina request

## 2017-05-20 NOTE — Progress Notes (Signed)
Pharmacy Antibiotic Note  Betty Jackson is a 83 y.o. female admitted on 05/20/2017 with sepsis.  Pharmacy has been consulted for vancomycin and zosyn dosing. Renal function wnl.  Vancomycin trough goal 15-20  Plan: 1) Vancomycin 1g IV q24 2) Zosyn 3.375g IV q8 (4h infusion) 3) Follow renal function, cultures, LOT, level if needed  Height: 5\' 4"  (162.6 cm) Weight: 145 lb (65.8 kg) IBW/kg (Calculated) : 54.7  Temp (24hrs), Avg:104.3 F (40.2 C), Min:104.3 F (40.2 C), Max:104.3 F (40.2 C)  Recent Labs  Lab 05/20/17 1108 05/20/17 1118  WBC 10.8*  --   CREATININE 1.09*  --   LATICACIDVEN  --  2.10*    Estimated Creatinine Clearance: 38.4 mL/min (A) (by C-G formula based on SCr of 1.09 mg/dL (H)).    Allergies  Allergen Reactions  . Dextromethorphan Rash    Antimicrobials this admission: 2/21 Vancomycin >> 2/21 Zosyn >>  Dose adjustments this admission: n/a  Microbiology results: 2/21 blood cx>> 2/21 urine cx>> 2/21 flu panel >>  Thank you for allowing pharmacy to be a part of this patient's care.  Deboraha Sprang 05/20/2017 1:31 PM

## 2017-05-20 NOTE — ED Notes (Signed)
Attempted report to floor.  

## 2017-05-20 NOTE — H&P (Addendum)
History and Physical    Betty Jackson VBT:[id number] DOB: [birth year] DOA: 05/20/2017  **Will admit patient based on the expectation that the patient will need hospitalization/ hospital care that crosses at least 2 midnights  PCP: Eulas Post, MD   Attending physician: Evangeline Gula  Patient coming from/Resides with: Private residence  Chief Complaint: Nausea, vomiting and malaise  HPI: Betty Jackson is a 83 y.o. female with medical history significant for solitary episode of PAF, mild CAD, hypertension, giant cell arteritis on chronic prednisone, vocal cord dysfunction, hemorrhoids, and diverticulosis.  Patient reports 2 days ago she developed productive cough with green sputum, nausea and vomiting without diarrhea and chills.  Evaluation in the ER revealed a rectal temperature of 104.3 down to 102.7 orally after Tylenol, she was not tachypneic at that time and her blood pressure was suboptimal for a patient with hypertension, her room air saturations were 97% prompting application of 2 L of nasal cannula oxygen.  She currently is tachypneic with mild increased work of breathing and rhonchorous lung sounds on exam.  She has had persistent nausea.  Influenza PCR returned back positive for type A but given her ongoing nausea and vomiting she has been unable to tolerate oral Tamiflu.  Chest x-ray questioned a possible right middle lung infiltrate.  Of note her serum lactate is elevated and greater than 2.  Patient has received 2 L of IV fluids in the ER.  ED Course:  Vital Signs: BP (!) 123/43   Pulse 89   Temp (!) 102.7 F (39.3 C) (Oral)   Resp (!) 31   Ht '5\' 4"'  (1.626 m)   Wt 65.8 kg (145 lb)   SpO2 94%   BMI 24.89 kg/m  Chest x-ray: As above CT abdomen and pelvis: No acute findings Lab data: Sodium 135, potassium 3.2, chloride 96, CO2 26, glucose 109, BUN 9, creatinine 1.09, calcium 8.7, lactic acid 2.1 and 2.29, white count 10,800 with neutrophils 76% and absolute neutrophils  8.2%, hemoglobin 12.2, platelets 170,000, urinalysis abnormal with cloudy appearance, moderate hemoglobin, large leukocytes, positive nitrite, 30 protein, specific gravity 1.041 anterior, 6-30 RBCs, WBCs TNTC, red blood cultures x2 obtained Medications and treatments: Normal saline bolus times 2 L, Zosyn 3.375 g IV x1, vancomycin 1 g IV x1, Tylenol suppository 975 mg x1, Zofran 4 mg IV x1, potassium 10 mEq IV x2 runs  Review of Systems:  In addition to the HPI above,  No Headache, changes with Vision or hearing, new weakness, tingling, numbness in any extremity, dizziness, dysarthria or word finding difficulty, gait disturbance or imbalance, tremors or seizure activity No problems swallowing food or Liquids, indigestion/reflux, choking or coughing while eating, abdominal pain with or after eating No Chest pain, palpitations, orthopnea  No Abdominal pain, melena,hematochezia, dark tarry stools, constipation No dysuria, malodorous urine, hematuria or flank pain No new skin rashes, lesions, masses or bruises, No new joint pains, aches, swelling or redness No recent unintentional weight gain or loss No polyuria, polydypsia or polyphagia   Past Medical History:  Diagnosis Date  . Anxiety    "more so the last few days" (10/28/2012)  . Anxiety state, unspecified 10/28/2012  . Arthritis     Right hemiarthroplasty after hip fracture 06/2011  . Arthritis    "hands" (10/28/2012)  . Bruises easily    d/t being on prednisone and plavix  . CAD (coronary artery disease)    a. Stent RCA in [facility];  b. Cath approx 2009 - nonobs  per pt report.  . Cataract    immature on the left eye  . Chronic lower back pain    scoliosis  . CKD (chronic kidney disease) stage 20, GFR 30-59 ml/min (HCC)   . Complication of anesthesia    pt has a very high tolerance to meds  . DDD (degenerative disc disease)   . Depression   . Diverticulitis    hx of  . Diverticulosis   . Enteritis   . Exertional shortness  of breath    "every now and then" (10/28/2012)  . Giant cell arteritis ([place])   . Hemorrhoids   . History of scabies   . HTN (hypertension)   . Hyperlipidemia    takes Lipitor daily  . Hypertension    takes Amlodipine,Losartan,Metoprolol,and HCTZ daily  . Incontinence of urine   . Insomnia    takes Restoril nightly  . Joint pain   . Migraines    "back in my 20's; none since" (10/28/2012)  . Osteopenia   . Osteoporosis   . PAF (paroxysmal atrial fibrillation) ([place])    a. lone epidode in 2011 according to notes.  . Scoliosis   . Sinus bradycardia    a. on chronic bb  . Temporal arteritis ([place]) 2011   a. followed @ [facility]; potential flareup 10/28/2012/notes 10/28/2012  . Urinary frequency   . Vocal cord dysfunction    "they don't operate properly" (10/28/2012)    Past Surgical History:  Procedure Laterality Date  . ABDOMINAL HYSTERECTOMY  ~ 1984   vaginally  . BACK SURGERY  7-35yr ago   X Stop  . BLADDER SUSPENSION  2001  . BREAST BIOPSY Right   . CATARACT EXTRACTION W/ INTRAOCULAR LENS IMPLANT Right ~ 08/2012  . COLONOSCOPY  01/26/2012   Procedure: COLONOSCOPY;  Surgeon: MLadene Artist MD,FACG;  [facility];  Service: Endoscopy;  Laterality: N/A;  note the EGD is possible  . CORONARY ANGIOPLASTY WITH STENT PLACEMENT  2006   X 1 stent  . EPIDURAL BLOCK INJECTION    . ESOPHAGOGASTRODUODENOSCOPY  01/26/2012   Procedure: ESOPHAGOGASTRODUODENOSCOPY (EGD);  Surgeon: MLadene Artist MD,FACG;  [facility];  Service: Endoscopy;  Laterality: N/A;  . HEMIARTHROPLASTY HIP Right 2012  . LAPAROSCOPIC CHOLECYSTECTOMY  2001  . LUMBAR FUSION  03/2013  . LUMBAR LAMINECTOMY/DECOMPRESSION MICRODISCECTOMY Right 11/04/2015   Procedure: Right Lumbar Five-Sacral One Microdiskectomy;  Surgeon: HKristeen Miss MD;  [facility];  Service: Neurosurgery;  Laterality: Right;  Right L5-S1 Microdiskectomy  . moles removed that required stiches     one on leg and one on face  .  TEMPORAL ARTERY BIOPSY / LIGATION Bilateral 2011  . TONSILLECTOMY AND ADENOIDECTOMY     at age 83 . TUBAL LIGATION  ~ 1982  . X-STOP IMPLANTATION  ~ 2010   "lower back" (10/28/2012)    Social History   Socioeconomic History  . Marital status: Married    Spouse name: Not on file  . Number of children: 3  . Years of education: Not on file  . Highest education level: Not on file  Social Needs  . Financial resource strain: Not on file  . Food insecurity - worry: Not on file  . Food insecurity - inability: Not on file  . Transportation needs - medical: Not on file  . Transportation needs - non-medical: Not on file  Occupational History  . 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 Smoking status: Never Smoker  . Smokeless tobacco:  Never Used  Substance and Sexual Activity  . Alcohol use: Yes    Alcohol/week: 0.0 oz    Comment: socially  . Drug use: No  . Sexual activity: Not on file  Other Topics Concern  . Not on file  Social History Narrative   Lives in [place] with her husband.  Retired Cabin crew.    Mobility: Utilizes a cane Work history: Not obtained   Allergies  Allergen Reactions  . Dextromethorphan Rash    Family History  Problem Relation Age of Onset  . Brain cancer Mother 51  . Heart attack Father   . Breast cancer Daughter 75  . Heart disease Other   . Hypertension Other   . Arthritis Neg Hx   . Colon cancer Neg Hx   . Osteoporosis Neg Hx      Prior to Admission medications   Medication Sig Start Date End Date Taking? Authorizing Provider  amLODipine (NORVASC) 5 MG tablet TAKE 1 TABLET (5 MG TOTAL) BY MOUTH DAILY. 09/01/16  Yes Burchette, Alinda Sierras, MD  calcium carbonate (OS-CAL) 600 MG TABS Take 600 mg by mouth daily.   Yes [provider]  cholecalciferol (VITAMIN D) 1000 UNITS tablet Take 1,000 Units by mouth daily.    Yes [provider]  clopidogrel (PLAVIX) 75 MG tablet TAKE 1 TABLET BY MOUTH EVERY DAY WITH BREAKFAST  02/01/17  Yes Burchette, Alinda Sierras, MD  escitalopram (LEXAPRO) 10 MG tablet TAKE 1 TABLET BY MOUTH EVERY DAY 04/26/17  Yes Burchette, Alinda Sierras, MD  fluticasone (FLONASE) 50 MCG/ACT nasal spray Place 1 spray into both nostrils daily as needed. 04/30/17  Yes [provider]  furosemide (LASIX) 20 MG tablet TAKE ONE TABLET DAILY FOR 3 DAYS AND THEN ONE TABLET DAILY AS NEEDED 03/08/17  Yes Burchette, Alinda Sierras, MD  hydrochlorothiazide (MICROZIDE) 12.5 MG capsule Take 1 capsule (12.5 mg total) by mouth daily. 12/22/16  Yes Burchette, Alinda Sierras, MD  losartan (COZAAR) 100 MG tablet TAKE 1 TABLET BY MOUTH EVERY DAY 02/23/17  Yes Burchette, Alinda Sierras, MD  metoprolol (LOPRESSOR) 50 MG tablet TAKE 1 TABLET BY MOUTH TWICE A DAY Patient taking differently: Taking 1 tab daily 01/24/16  Yes Burchette, Alinda Sierras, MD  ondansetron (ZOFRAN ODT) 4 MG disintegrating tablet Take 1 tablet (4 mg total) by mouth every 8 (eight) hours as needed for nausea or vomiting. 07/22/16  Yes Burchette, Alinda Sierras, MD  potassium chloride (KLOR-CON 10) 10 MEQ tablet TAKE 1 TABLET (10 MEQ TOTAL) BY MOUTH DAILY. 12/02/16  Yes Burchette, Alinda Sierras, MD  predniSONE (DELTASONE) 10 MG tablet Take 10 mg by mouth. Use as directed   Yes [provider]  temazepam (RESTORIL) 15 MG capsule Take 1 capsule (15 mg total) by mouth at bedtime as needed for sleep. 03/31/17  Yes Burchette, Alinda Sierras, MD  vitamin C (ASCORBIC ACID) 500 MG tablet Take 1,000 mg by mouth daily.    Yes [provider]  vitamin E (VITAMIN E) 400 UNIT capsule Take 400 Units by mouth daily.   Yes [provider]  HYDROcodone-homatropine Judithe Modest) 5-1.5 MG/5ML syrup Take 1 tsp every 6 hrs as needed for cough Patient not taking: Reported on 05/20/2017 04/06/17   Eulas Post, MD    Physical Exam: Vitals:   05/20/17 1415 05/20/17 1430 05/20/17 1445 05/20/17 1500  BP: (!) 112/44 (!) 103/44 (!) 118/54 (!) 123/43  Pulse: 91 88 93 89  Resp: (!) 26 (!) 30 (!) 31 (!) 31  Temp:      TempSrc:      SpO2: 92% 95% 96% 94%  Weight:      Height:          Constitutional: NAD but sleepy, calm, comfortable Eyes: PERRL, lids and conjunctivae normal ENMT: Mucous membranes are very dry. Posterior pharynx clear of any exudate or lesions. Neck: normal, supple, no masses, no thyromegaly Respiratory: Coarse to auscultation with expiratory rhonchi bilaterally.  Essentially normal respiratory effort with slight increased work of breathing and notable tachypnea with respiratory rate in the 30s. No accessory muscle use.  3 L Cardiovascular: Regular rate and rhythm, no murmurs / rubs / gallops. No extremity edema. 2+ pedal pulses. No carotid bruits.  Abdomen: no tenderness, no masses palpated. No hepatosplenomegaly. Bowel sounds positive.  Musculoskeletal: no clubbing / cyanosis. No joint deformity upper and lower extremities. Good ROM, no contractures. Normal muscle tone.  Skin: Hot to touch -no rashes, lesions, ulcers. No induration-multiple bruises on hands and forearms Neurologic: CN 2-12 grossly intact. Sensation intact, DTR normal. Strength 5/5 x all 4 extremities.  Psychiatric: Normal judgment and insight. Alert and oriented x 3. Normal mood.  Sleepy but easily awakened.   Labs on Admission: I have personally reviewed following labs and imaging studies  CBC: Recent Labs  Lab 05/20/17 1108  WBC 10.8*  NEUTROABS 8.2*  HGB 12.2  HCT 37.0  MCV 93.0  PLT 702   Basic Metabolic Panel: Recent Labs  Lab 05/20/17 1108  NA 135  K 3.2*  CL 96*  CO2 26  GLUCOSE 109*  BUN 9  CREATININE 1.09*  CALCIUM 8.7*   GFR: Estimated Creatinine Clearance: 38.4 mL/min (A) (by C-G formula based on SCr of 1.09 mg/dL (H)). Liver Function Tests: Recent Labs  Lab 05/20/17 1108  AST 32  ALT 22  ALKPHOS 63  BILITOT 0.8  PROT 6.2*  ALBUMIN 3.7   Recent Labs  Lab 05/20/17 1108  LIPASE 38   No results for input(s): AMMONIA in the last 168 hours. Coagulation  Profile: No results for input(s): INR, PROTIME in the last 168 hours. Cardiac Enzymes: No results for input(s): CKTOTAL, CKMB, CKMBINDEX, TROPONINI in the last 168 hours. BNP (last 3 results) No results for input(s): PROBNP in the last 8760 hours. HbA1C: No results for input(s): HGBA1C in the last 72 hours. CBG: No results for input(s): GLUCAP in the last 168 hours. Lipid Profile: No results for input(s): CHOL, HDL, LDLCALC, TRIG, CHOLHDL, LDLDIRECT in the last 72 hours. Thyroid Function Tests: No results for input(s): TSH, T4TOTAL, FREET4, T3FREE, THYROIDAB in the last 72 hours. Anemia Panel: No results for input(s): VITAMINB12, FOLATE, FERRITIN, TIBC, IRON, RETICCTPCT in the last 72 hours. Urine analysis:    Component Value Date/Time   COLORURINE YELLOW 05/20/2017 1444   APPEARANCEUR CLOUDY (A) 05/20/2017 1444   LABSPEC 1.041 (H) 05/20/2017 1444   PHURINE 5.0 05/20/2017 1444   GLUCOSEU NEGATIVE 05/20/2017 1444   GLUCOSEU NEGATIVE 07/31/2016 1018   HGBUR MODERATE (A) 05/20/2017 1444   BILIRUBINUR NEGATIVE 05/20/2017 1444   BILIRUBINUR neg 07/31/2016 1012   KETONESUR NEGATIVE 05/20/2017 1444   PROTEINUR 30 (A) 05/20/2017 1444   UROBILINOGEN 0.2 07/31/2016 1018   NITRITE POSITIVE (A) 05/20/2017 1444   LEUKOCYTESUR LARGE (A) 05/20/2017 1444   Sepsis Labs: '@LABRCNTIP' (procalcitonin:4,lacticidven:4) )No results found for this or any previous visit (from the past 240 hour(s)).   Radiological Exams on Admission: Ct Abdomen Pelvis W Contrast  Result Date: 05/20/2017 CLINICAL DATA:  Nausea and  vomiting x1 day with progressive weakness. EXAM: CT ABDOMEN AND PELVIS WITH CONTRAST TECHNIQUE: Multidetector CT imaging of the abdomen and pelvis was performed using the standard protocol following bolus administration of intravenous contrast. CONTRAST:  66m ISOVUE-300 IOPAMIDOL (ISOVUE-300) INJECTION 61% COMPARISON:  06/21/2013 FINDINGS: Lower chest: Heart size is top normal without  pericardial effusion or thickening. There is dependent atelectasis at the lung bases left greater than right. There is a small hiatal hernia present. Hepatobiliary: Status post cholecystectomy. Mild hepatic steatosis. Slight surface nodularity of the liver may represent morphologic changes of cirrhosis. No space-occupying mass or biliary dilatation is noted. Pancreas: Normal Spleen: Small calcification in the anterior aspect of the spleen compatible with a granuloma. No splenomegaly or mass. Adrenals/Urinary Tract: Normal bilateral adrenal glands. No obstructive uropathy. No enhancing renal masses. Tiny bilateral subcentimeter renal hypodensities measuring on the order of 4 and 5 mm are noted, too small to further characterize but more commonly associated with small cysts nondistended urinary bladder which is slightly thick-walled in appearance likely due to underdistention. Correlate to exclude a cystitis. Stomach/Bowel: Nondistended stomach without mural thickening. Normal small bowel rotation is noted without obstruction or inflammation. Normal appearing appendix. Nondistended stool-filled large bowel with scattered colonic diverticulosis. No acute large bowel obstruction or inflammation. Vascular/Lymphatic: Moderate aortoiliac atherosclerosis. No lymphadenopathy. Reproductive: Status post hysterectomy. No adnexal masses. Other: No free air nor free fluid. Small fat containing periumbilical hernia. Musculoskeletal: L2 through L4 lumbar spinal fusion with interbody blocks in place. Multilevel degenerative disc disease with vacuum disc phenomena of the included lower thoracic and remainder of lumbar spine. Right hip arthroplasty noted. No apparent hardware failure. No acute osseous abnormality is seen. IMPRESSION: 1. Scattered colonic diverticulosis without acute diverticulitis. No acute bowel obstruction or inflammation. 2. Mild hepatic steatosis. Slight surface nodularity of the liver may represent morphologic  changes of cirrhosis. 3. Small splenic granuloma. 4. Tiny subcentimeter renal hypodensities statistically consistent with cysts but too small to further characterize. 5. Moderate aortoiliac atherosclerosis. 6. Thoracolumbar spondylosis with spinal fusion hardware L2 through L4. Electronically Signed   By: DAshley RoyaltyM.D.   On: 05/20/2017 13:38   Dg Chest Port 1 View  Result Date: 05/20/2017 CLINICAL DATA:  Sepsis.  Fever.  Vomiting. EXAM: PORTABLE CHEST 1 VIEW COMPARISON:  CT 11/26/2016.  Chest x-ray 11/25/2016. FINDINGS: Mediastinum hilar structures normal. Cardiomegaly with normal pulmonary vascularity. Mild right mid lung field infiltrate cannot be excluded. No pleural effusion or pneumothorax. IMPRESSION: Mild right mid lung field infiltrate cannot be excluded. Electronically Signed   By: TMarcello Moores Register   On: 05/20/2017 11:43    EKG: (Independently reviewed) rhythm with ventricular rate of 85 bpm, QTC 424 ms, slightly early transition of R wave, no acute ischemic changes  Assessment/Plan Principal Problem:   Sepsis w/Acute respiratory failure with hypoxia 2/2 Influenza A -She presents with 2 days of cough reductive green sputum, nausea/ vomiting and generalized malaise.  Found to be strongly febrile in ER and influenza PCR A was positive -Sepsis criteria met as follows: Fever greater than 102 F, tachypnea, acute hypoxemia, serum lactate greater than 2.0, source of infection; change in GFR from 81 to 47 -Admit to stepdown level of care -Chest x-ray questions possible right middle lobe infiltrate so will obtain CT chest to better clarify -Lower respiratory tract Procalcitonin -Continue to trend lactate until consistently below 2.0 -Continue volume resuscitation -Follow-up on blood cultures obtained in ER, obtain urinary strep and sputum culture -Treat potential pneumonia as community-acquired therefore de-escalate to  Rocephin/Zithromax IV -Supportive care with oxygen and wean as  tolerated -On chronic low-dose prednisone -check random serum cortisol -Patient remains significantly febrile after Tylenol; discussed with attending and although GFR lower than normal since we are actively hydrating will give a one-time dose of IV Toradol 15 mg as antipyretic  1715: **SBP slowly trending down with most recent reading 99.  Random cortisol level low at 2.3.  RN updated.  Orders for normal saline bolus times 1 L and we will DC low-dose prednisone in favor of stress dose steroids Solu-Cortef 50 mg IV every 8 hours first dose now.  At this point patient remains appropriate for stepdown level care but will continue to monitor in the event she needs transfer to the ICU for short-term pressors noting no additional escalation of care given underlying DNR status.  Temperature remains elevated at 102.5- recently received one-time dose IV Toradol.  Active Problems:   Abnormal urinalysis -Appears consistent with UTI as well as profound dehydration -Follow-up on urine culture obtained in ER -Rocephin ordered for URI/possible pneumonia we will cover typical urinary tract organisms     Acute hypokalemia -In context of nausea and vomiting; also apparently takes Lasix at home -Has been given 20 mEq via IV bolus in ED -Add potassium to maintenance fluids -Once able to tolerate p.o. would give oral dosing with food -Follow labs    CKD (chronic kidney disease) stage 3, GFR 30-59 ml/min -Baseline creatinine: 0.73 with baseline GFR 81.4 -Current creatinine: 1.09 with GFR 47    HTN (hypertension) -Blood pressure reading suboptimal although not hypotensive in context of sepsis physiology -Hold preadmission beta-blocker and ARB    Remote history of PAF (paroxysmal atrial fibrillation)  -Documented as 1 solitary episode without recurrence -Currently maintaining sinus rhythm on EKG    History of giant cell arteritis  -On prednisone 10 mg daily-continue but if nausea vomiting an issue may need  to transition to Solu-Medrol 40 mg IV daily until can tolerate oral medications -If developed hypotension and or random cortisol low may need to utilize stress dose steroids    Coronary disease  -Documented as mild disease involving the LAD and circumflex based on most recent cardiac catheterization in 2014 -Previously had stent to RCA placed in [place], [place] in 2006 -Hold preadmission beta-blocker in context of suboptimal blood pressure readings and sepsis physiology -We will attempt to continue preadmission Plavix but if this continues we will need to hold    **Additional lab, imaging and/or diagnostic evaluation at discretion of supervising physician  DVT prophylaxis: Lovenox Code Status: DNR Family Communication: Family at bedside Disposition Plan: Home Consults called: None    ELLIS,ALLISON L. ANP-BC [facility] Pager [phone]   If 7PM-7AM, please contact night-coverage [email or web address] Password [facility]  05/20/2017, 3:53 PM

## 2017-05-20 NOTE — Telephone Encounter (Signed)
Confirmed pt has arrived in the ER now.

## 2017-05-20 NOTE — ED Provider Notes (Signed)
[facility] EMERGENCY DEPARTMENT Provider Note   CSN: [id number] Arrival date & time: 05/20/17  1045     History   Chief Complaint Chief Complaint  Patient presents with  . Weakness    HPI Betty Jackson is a 83 y.o. female.  HPI  83 year old female with past medical history as below including hypertension, hyperlipidemia, coronary disease, here with generalized fatigue and weakness.  The patient has reportedly been feeling generally unwell for the last several days.  She has had nausea, vomiting, and diffuse body aches.  She has had increasing weakness and has had difficulty getting around the house.  Denies any known sick contacts.  She is been vomiting nonbloody, nonbilious emesis.  She has been moving her bowels without difficulty.  She has intermittent epigastric pain with vomiting that then resolves.  No recent medication changes.  She is had a cough as well with occasional sputum production and this seems to be increasing in severity.  No wheezing.  No chest pain.  Past Medical History:  Diagnosis Date  . Anxiety    "more so the last few days" (10/28/2012)  . Anxiety state, unspecified 10/28/2012  . Arthritis     Right hemiarthroplasty after hip fracture 06/2011  . Arthritis    "hands" (10/28/2012)  . Bruises easily    d/t being on prednisone and plavix  . CAD (coronary artery disease)    a. Stent RCA in [facility];  b. Cath approx 2009 - nonobs per pt report.  . Cataract    immature on the left eye  . Chronic lower back pain    scoliosis  . Complication of anesthesia    pt has a very high tolerance to meds  . DDD (degenerative disc disease)   . Depression   . Diverticulitis    hx of  . Diverticulosis   . Enteritis   . Exertional shortness of breath    "every now and then" (10/28/2012)  . Giant cell arteritis ([place])   . Hemorrhoids   . History of scabies   . Hyperlipidemia    takes Lipitor daily  . Hypertension    takes  Amlodipine,Losartan,Metoprolol,and HCTZ daily  . Incontinence of urine   . Insomnia    takes Restoril nightly  . Joint pain   . Migraines    "back in my 20's; none since" (10/28/2012)  . Osteopenia   . Osteoporosis   . PAF (paroxysmal atrial fibrillation) ([place])    a. lone epidode in 2011 according to notes.  . Scoliosis   . Sinus bradycardia    a. on chronic bb  . Temporal arteritis ([place]) 2011   a. followed @ [facility]; potential flareup 10/28/2012/notes 10/28/2012  . Urinary frequency   . Vocal cord dysfunction    "they don't operate properly" (10/28/2012)    Patient Active Problem List   Diagnosis Date Noted  . Osteoporosis 05/08/2016  . Herniated nucleus pulposus, L5-S1, right 11/04/2015  . Major depressive disorder, recurrent episode, moderate ([place]) 07/16/2013  . Abdominal pain, epigastric 06/22/2013  . Lumbar stenosis 04/25/2013  . Chronic insomnia 02/07/2013  . Elevated transaminase level 02/07/2013  . CVA (cerebral infarction) 10/29/2012  . Visual changes 10/28/2012  . Depression 10/28/2012  . Anxiety state, unspecified 10/28/2012  . Anemia, unspecified 10/28/2012  . Nonspecific (abnormal) findings on radiological and other examination of gastrointestinal tract 01/25/2012  . Hyponatremia 01/24/2012  . Hematuria 01/24/2012  . Enteritis 12/24/2011  . Abdominal pain, other specified site 12/24/2011  .  Leg pain, right 02/18/2011  . Temporal arteritis ([place]) 09/01/2010  . Hypertension 09/01/2010  . Hyperlipidemia 09/01/2010  . History of atrial fibrillation 09/01/2010  . CAD (coronary artery disease) 09/01/2010  . GERD (gastroesophageal reflux disease) 09/01/2010  . Insomnia 09/01/2010    Past Surgical History:  Procedure Laterality Date  . ABDOMINAL HYSTERECTOMY  ~ 1984   vaginally  . BACK SURGERY  7-35yrs ago   X Stop  . BLADDER SUSPENSION  2001  . BREAST BIOPSY Right   . CATARACT EXTRACTION W/ INTRAOCULAR LENS IMPLANT Right ~ 08/2012  . COLONOSCOPY  01/26/2012    Procedure: COLONOSCOPY;  Surgeon: Ladene Artist, MD,FACG;  Location: [facility] ENDOSCOPY;  Service: Endoscopy;  Laterality: N/A;  note the EGD is possible  . CORONARY ANGIOPLASTY WITH STENT PLACEMENT  2006   X 1 stent  . EPIDURAL BLOCK INJECTION    . ESOPHAGOGASTRODUODENOSCOPY  01/26/2012   Procedure: ESOPHAGOGASTRODUODENOSCOPY (EGD);  Surgeon: Ladene Artist, MD,FACG;  Location: [facility] ENDOSCOPY;  Service: Endoscopy;  Laterality: N/A;  . HEMIARTHROPLASTY HIP Right 2012  . LAPAROSCOPIC CHOLECYSTECTOMY  2001  . LUMBAR FUSION  03/2013  . LUMBAR LAMINECTOMY/DECOMPRESSION MICRODISCECTOMY Right 11/04/2015   Procedure: Right Lumbar Five-Sacral One Microdiskectomy;  Surgeon: Kristeen Miss, MD;  Location: [facility] NEURO ORS;  Service: Neurosurgery;  Laterality: Right;  Right L5-S1 Microdiskectomy  . moles removed that required stiches     one on leg and one on face  . TEMPORAL ARTERY BIOPSY / LIGATION Bilateral 2011  . TONSILLECTOMY AND ADENOIDECTOMY     at age 39  . TUBAL LIGATION  ~ 1982  . X-STOP IMPLANTATION  ~ 2010   "lower back" (10/28/2012)    OB History    No data available       Home Medications    Prior to Admission medications   Medication Sig Start Date End Date Taking? Authorizing Provider  amLODipine (NORVASC) 5 MG tablet TAKE 1 TABLET (5 MG TOTAL) BY MOUTH DAILY. 09/01/16   Burchette, Alinda Sierras, MD  calcium carbonate (OS-CAL) 600 MG TABS Take 600 mg by mouth daily.    [provider]  cholecalciferol (VITAMIN D) 1000 UNITS tablet Take 1,000 Units by mouth daily.     [provider]  clopidogrel (PLAVIX) 75 MG tablet TAKE 1 TABLET BY MOUTH EVERY DAY WITH BREAKFAST 02/01/17   Burchette, Alinda Sierras, MD  escitalopram (LEXAPRO) 10 MG tablet TAKE 1 TABLET BY MOUTH EVERY DAY 04/26/17   Burchette, Alinda Sierras, MD  furosemide (LASIX) 20 MG tablet TAKE ONE TABLET DAILY FOR 3 DAYS AND THEN ONE TABLET DAILY AS NEEDED 03/08/17   Burchette, Alinda Sierras, MD  hydrochlorothiazide (MICROZIDE) 12.5 MG capsule  Take 1 capsule (12.5 mg total) by mouth daily. 12/22/16   Burchette, Alinda Sierras, MD  HYDROcodone-homatropine (HYCODAN) 5-1.5 MG/5ML syrup Take 1 tsp every 6 hrs as needed for cough 04/06/17   Burchette, Alinda Sierras, MD  losartan (COZAAR) 100 MG tablet TAKE 1 TABLET BY MOUTH EVERY DAY 02/23/17   Burchette, Alinda Sierras, MD  metoprolol (LOPRESSOR) 50 MG tablet TAKE 1 TABLET BY MOUTH TWICE A DAY Patient taking differently: Taking 1 tab daily 01/24/16   Burchette, Alinda Sierras, MD  ondansetron (ZOFRAN ODT) 4 MG disintegrating tablet Take 1 tablet (4 mg total) by mouth every 8 (eight) hours as needed for nausea or vomiting. 07/22/16   Burchette, Alinda Sierras, MD  potassium chloride (KLOR-CON 10) 10 MEQ tablet TAKE 1 TABLET (10 MEQ TOTAL) BY MOUTH DAILY. 12/02/16  Burchette, Alinda Sierras, MD  predniSONE (DELTASONE) 10 MG tablet Take 10 mg by mouth. Use as directed    [provider]  temazepam (RESTORIL) 15 MG capsule Take 1 capsule (15 mg total) by mouth at bedtime as needed for sleep. 03/31/17   Burchette, Alinda Sierras, MD  vitamin C (ASCORBIC ACID) 500 MG tablet Take 1,000 mg by mouth daily as needed (for sore throat).     [provider]  vitamin E (VITAMIN E) 400 UNIT capsule Take 400 Units by mouth daily.    [provider]    Family History Family History  Problem Relation Age of Onset  . Brain cancer Mother 66  . Heart attack Father   . Breast cancer Daughter 85  . Heart disease Other   . Hypertension Other   . Arthritis Neg Hx   . Colon cancer Neg Hx   . Osteoporosis Neg Hx     Social History Social History   Tobacco Use  . Smoking status: Never Smoker  . Smokeless tobacco: Never Used  Substance Use Topics  . Alcohol use: Yes    Alcohol/week: 0.0 oz    Comment: socially  . Drug use: No     Allergies   Dextromethorphan   Review of Systems Review of Systems  Constitutional: Positive for fatigue.  Respiratory: Positive for cough, shortness of breath and wheezing.     Musculoskeletal: Positive for arthralgias and myalgias.  Neurological: Positive for weakness.  All other systems reviewed and are negative.    Physical Exam Updated Vital Signs BP (!) 112/44   Pulse 91   Temp (!) 102.7 F (39.3 C) (Oral)   Resp (!) 26   Ht 5\' 4"  (1.626 m)   Wt 65.8 kg (145 lb)   SpO2 92%   BMI 24.89 kg/m   Physical Exam  Constitutional: She is oriented to person, place, and time. She appears well-developed and well-nourished. She appears distressed.  HENT:  Head: Normocephalic and atraumatic.  Dry mucous membranes  Eyes: Conjunctivae are normal.  Neck: Neck supple.  Cardiovascular: Normal rate, regular rhythm and normal heart sounds. Exam reveals no friction rub.  No murmur heard. Pulmonary/Chest: Tachypnea noted. She has no wheezes. She has rales in the right lower field and the left lower field.  Abdominal: She exhibits no distension. There is tenderness in the epigastric area. There is no rigidity, no rebound and no guarding.  Musculoskeletal: She exhibits no edema.  Neurological: She is alert and oriented to person, place, and time. She exhibits normal muscle tone.  Skin: Skin is warm. Capillary refill takes less than 2 seconds.  Psychiatric: She has a normal mood and affect.  Nursing note and vitals reviewed.    ED Treatments / Results  Labs (all labs ordered are listed, but only abnormal results are displayed) Labs Reviewed  COMPREHENSIVE METABOLIC PANEL - Abnormal; Notable for the following components:      Result Value   Potassium 3.2 (*)    Chloride 96 (*)    Glucose, Bld 109 (*)    Creatinine, Ser 1.09 (*)    Calcium 8.7 (*)    Total Protein 6.2 (*)    GFR calc non Af Amer 47 (*)    GFR calc Af Amer 54 (*)    All other components within normal limits  CBC WITH DIFFERENTIAL/PLATELET - Abnormal; Notable for the following components:   WBC 10.8 (*)    Neutro Abs 8.2 (*)    Monocytes Absolute 1.1 (*)  All other components within  normal limits  INFLUENZA PANEL BY PCR (TYPE A & B) - Abnormal; Notable for the following components:   Influenza A By PCR POSITIVE (*)    All other components within normal limits  I-STAT CG4 LACTIC ACID, ED - Abnormal; Notable for the following components:   Lactic Acid, Venous 2.10 (*)    All other components within normal limits  I-STAT CG4 LACTIC ACID, ED - Abnormal; Notable for the following components:   Lactic Acid, Venous 2.29 (*)    All other components within normal limits  CULTURE, BLOOD (ROUTINE X 2)  CULTURE, BLOOD (ROUTINE X 2)  URINE CULTURE  LIPASE, BLOOD  URINALYSIS, ROUTINE W REFLEX MICROSCOPIC    EKG  EKG Interpretation  Date/Time:  Thursday May 20 2017 10:58:07 EST Ventricular Rate:  85 PR Interval:    QRS Duration: 98 QT Interval:  356 QTC Calculation: 424 R Axis:   36 Text Interpretation:  Sinus rhythm Abnormal R-wave progression, early transition Since last EKG, subtle ST depresisons laterally without elevations Confirmed by Duffy Bruce [phone]) on 05/20/2017 11:27:49 AM       Radiology Ct Abdomen Pelvis W Contrast  Result Date: 05/20/2017 CLINICAL DATA:  Nausea and vomiting x1 day with progressive weakness. EXAM: CT ABDOMEN AND PELVIS WITH CONTRAST TECHNIQUE: Multidetector CT imaging of the abdomen and pelvis was performed using the standard protocol following bolus administration of intravenous contrast. CONTRAST:  19mL ISOVUE-300 IOPAMIDOL (ISOVUE-300) INJECTION 61% COMPARISON:  06/21/2013 FINDINGS: Lower chest: Heart size is top normal without pericardial effusion or thickening. There is dependent atelectasis at the lung bases left greater than right. There is a small hiatal hernia present. Hepatobiliary: Status post cholecystectomy. Mild hepatic steatosis. Slight surface nodularity of the liver may represent morphologic changes of cirrhosis. No space-occupying mass or biliary dilatation is noted. Pancreas: Normal Spleen: Small calcification in the  anterior aspect of the spleen compatible with a granuloma. No splenomegaly or mass. Adrenals/Urinary Tract: Normal bilateral adrenal glands. No obstructive uropathy. No enhancing renal masses. Tiny bilateral subcentimeter renal hypodensities measuring on the order of 4 and 5 mm are noted, too small to further characterize but more commonly associated with small cysts nondistended urinary bladder which is slightly thick-walled in appearance likely due to underdistention. Correlate to exclude a cystitis. Stomach/Bowel: Nondistended stomach without mural thickening. Normal small bowel rotation is noted without obstruction or inflammation. Normal appearing appendix. Nondistended stool-filled large bowel with scattered colonic diverticulosis. No acute large bowel obstruction or inflammation. Vascular/Lymphatic: Moderate aortoiliac atherosclerosis. No lymphadenopathy. Reproductive: Status post hysterectomy. No adnexal masses. Other: No free air nor free fluid. Small fat containing periumbilical hernia. Musculoskeletal: L2 through L4 lumbar spinal fusion with interbody blocks in place. Multilevel degenerative disc disease with vacuum disc phenomena of the included lower thoracic and remainder of lumbar spine. Right hip arthroplasty noted. No apparent hardware failure. No acute osseous abnormality is seen. IMPRESSION: 1. Scattered colonic diverticulosis without acute diverticulitis. No acute bowel obstruction or inflammation. 2. Mild hepatic steatosis. Slight surface nodularity of the liver may represent morphologic changes of cirrhosis. 3. Small splenic granuloma. 4. Tiny subcentimeter renal hypodensities statistically consistent with cysts but too small to further characterize. 5. Moderate aortoiliac atherosclerosis. 6. Thoracolumbar spondylosis with spinal fusion hardware L2 through L4. Electronically Signed   By: Ashley Royalty M.D.   On: 05/20/2017 13:38   Dg Chest Port 1 View  Result Date: 05/20/2017 CLINICAL DATA:   Sepsis.  Fever.  Vomiting. EXAM: PORTABLE CHEST 1 VIEW  COMPARISON:  CT 11/26/2016.  Chest x-ray 11/25/2016. FINDINGS: Mediastinum hilar structures normal. Cardiomegaly with normal pulmonary vascularity. Mild right mid lung field infiltrate cannot be excluded. No pleural effusion or pneumothorax. IMPRESSION: Mild right mid lung field infiltrate cannot be excluded. Electronically Signed   By: Marcello Moores  Register   On: 05/20/2017 11:43    Procedures .Critical Care Performed by: Duffy Bruce, MD Authorized by: Duffy Bruce, MD   Critical care provider statement:    Critical care time (minutes):  35   Critical care time was exclusive of:  Separately billable procedures and treating other patients and teaching time   Critical care was necessary to treat or prevent imminent or life-threatening deterioration of the following conditions:  Cardiac failure, circulatory failure and sepsis   Critical care was time spent personally by me on the following activities:  Development of treatment plan with patient or surrogate, discussions with consultants, evaluation of patient's response to treatment, examination of patient, obtaining history from patient or surrogate, ordering and performing treatments and interventions, ordering and review of laboratory studies, ordering and review of radiographic studies, pulse oximetry, re-evaluation of patient's condition and review of old charts   I assumed direction of critical care for this patient from another provider in my specialty: no     (including critical care time)  Medications Ordered in ED Medications  piperacillin-tazobactam (ZOSYN) IVPB 3.375 g (not administered)  vancomycin (VANCOCIN) IVPB 1000 mg/200 mL premix (not administered)  potassium chloride 10 mEq in 100 mL IVPB (10 mEq Intravenous New Bag/Given 05/20/17 1418)  oseltamivir (TAMIFLU) capsule 75 mg (not administered)  ondansetron (ZOFRAN) injection 4 mg (not administered)  sodium chloride 0.9 %  bolus 1,000 mL (0 mLs Intravenous Stopped 05/20/17 1247)    And  sodium chloride 0.9 % bolus 1,000 mL (0 mLs Intravenous Stopped 05/20/17 1408)  piperacillin-tazobactam (ZOSYN) IVPB 3.375 g (0 g Intravenous Stopped 05/20/17 1210)  vancomycin (VANCOCIN) IVPB 1000 mg/200 mL premix (0 mg Intravenous Stopped 05/20/17 1332)  acetaminophen (TYLENOL) suppository 975 mg (975 mg Rectal Given 05/20/17 1156)  ondansetron (ZOFRAN) injection 4 mg (4 mg Intravenous Given 05/20/17 1146)  iopamidol (ISOVUE-300) 61 % injection (80 mLs  Contrast Given 05/20/17 1312)     Initial Impression / Assessment and Plan / ED Course  I have reviewed the triage vital signs and the nursing notes.  Pertinent labs & imaging results that were available during my care of the patient were reviewed by me and considered in my medical decision making (see chart for details).     83 year old female with past medical history as above here with generalized weakness.  On arrival, patient is tachycardic and febrile to 104 rectally.  She appears in moderate distress.  Patient activated as a code sepsis with broad-spectrum empiric antibiotics and fluid resuscitation.  Etiology of symptoms unclear.  She does have symptoms to suggest pneumonia with likely nausea and vomiting related to it, but also must consider influenza given her diffuse body aches.  Will check flu, chest x-ray, urine, and reassess.  Patient positive for influenza A.  She continues to have nausea and vomiting.  No surgical normality on imaging.  Admit to medicine.  Final Clinical Impressions(s) / ED Diagnoses   Final diagnoses:  Sepsis, due to unspecified organism [facility])  Influenza A    ED Discharge Orders    None       Duffy Bruce, MD 05/20/17 1453

## 2017-05-21 LAB — COMPREHENSIVE METABOLIC PANEL
ALT: 25 U/L (ref 14–54)
ANION GAP: 13 (ref 5–15)
AST: 66 U/L — AB (ref 15–41)
Albumin: 3 g/dL — ABNORMAL LOW (ref 3.5–5.0)
Alkaline Phosphatase: 52 U/L (ref 38–126)
BUN: 8 mg/dL (ref 6–20)
CHLORIDE: 107 mmol/L (ref 101–111)
CO2: 18 mmol/L — ABNORMAL LOW (ref 22–32)
Calcium: 7.3 mg/dL — ABNORMAL LOW (ref 8.9–10.3)
Creatinine, Ser: 1.04 mg/dL — ABNORMAL HIGH (ref 0.44–1.00)
GFR calc Af Amer: 57 mL/min — ABNORMAL LOW (ref >60)
GFR, EST NON AFRICAN AMERICAN: 49 mL/min — AB (ref >60)
Glucose, Bld: 109 mg/dL — ABNORMAL HIGH (ref 65–99)
POTASSIUM: 3.7 mmol/L (ref 3.5–5.1)
Sodium: 138 mmol/L (ref 135–145)
Total Bilirubin: 0.8 mg/dL (ref 0.3–1.2)
Total Protein: 5.4 g/dL — ABNORMAL LOW (ref 6.5–8.1)

## 2017-05-21 LAB — GLUCOSE, CAPILLARY: GLUCOSE-CAPILLARY: 142 mg/dL — AB (ref 65–99)

## 2017-05-21 LAB — URINE CULTURE

## 2017-05-21 LAB — CBC
HEMATOCRIT: 36.5 % (ref 36.0–46.0)
HEMOGLOBIN: 11.6 g/dL — AB (ref 12.0–15.0)
MCH: 30.1 pg (ref 26.0–34.0)
MCHC: 31.8 g/dL (ref 30.0–36.0)
MCV: 94.6 fL (ref 78.0–100.0)
Platelets: 153 10*3/uL (ref 150–400)
RBC: 3.86 MIL/uL — AB (ref 3.87–5.11)
RDW: 14.5 % (ref 11.5–15.5)
WBC: 10.2 10*3/uL (ref 4.0–10.5)

## 2017-05-21 LAB — MRSA PCR SCREENING: MRSA by PCR: NEGATIVE

## 2017-05-21 LAB — HIV ANTIBODY (ROUTINE TESTING W REFLEX): HIV Screen 4th Generation wRfx: NONREACTIVE

## 2017-05-21 LAB — STREP PNEUMONIAE URINARY ANTIGEN: Strep Pneumo Urinary Antigen: NEGATIVE

## 2017-05-21 MED ORDER — LEVALBUTEROL HCL 1.25 MG/0.5ML IN NEBU
1.2500 mg | INHALATION_SOLUTION | Freq: Once | RESPIRATORY_TRACT | Status: AC
  Administered 2017-05-21: 1.25 mg via RESPIRATORY_TRACT
  Filled 2017-05-21: qty 0.5

## 2017-05-21 MED ORDER — POLYVINYL ALCOHOL 1.4 % OP SOLN
1.0000 [drp] | OPHTHALMIC | Status: AC | PRN
  Administered 2017-05-21 – 2017-05-23 (×2): 1 [drp] via OPHTHALMIC
  Filled 2017-05-21: qty 15

## 2017-05-21 MED ORDER — FUROSEMIDE 10 MG/ML IJ SOLN
40.0000 mg | Freq: Once | INTRAMUSCULAR | Status: AC
  Administered 2017-05-21: 40 mg via INTRAVENOUS
  Filled 2017-05-21: qty 4

## 2017-05-21 MED ORDER — TEMAZEPAM 15 MG PO CAPS
15.0000 mg | ORAL_CAPSULE | Freq: Every evening | ORAL | Status: DC | PRN
  Administered 2017-05-21 – 2017-05-25 (×5): 15 mg via ORAL
  Filled 2017-05-21 (×5): qty 1

## 2017-05-21 MED ORDER — OXYCODONE-ACETAMINOPHEN 5-325 MG PO TABS
1.0000 | ORAL_TABLET | Freq: Four times a day (QID) | ORAL | Status: DC | PRN
  Administered 2017-05-21: 2 via ORAL
  Administered 2017-05-21 – 2017-05-24 (×10): 1 via ORAL
  Administered 2017-05-24: 2 via ORAL
  Administered 2017-05-25 – 2017-05-26 (×5): 1 via ORAL
  Filled 2017-05-21 (×9): qty 1
  Filled 2017-05-21: qty 2
  Filled 2017-05-21 (×6): qty 1
  Filled 2017-05-21: qty 2

## 2017-05-21 NOTE — Progress Notes (Signed)
PROGRESS NOTE    Betty Jackson  QQV:[id number] DOB: 1934-06-25 DOA: 05/20/2017 PCP: Eulas Post, MD  Outpatient Specialists:    Brief Narrative: Patient is an 83 year old female with past medical history significant for paroxysmal atrial fibrillation, mild coronary artery disease, hypertension, giant cell arteritis on chronic prednisone, vocal cord dysfunction, hemorrhoids and diverticulosis.  Patient is admitted with cough that is productive of greenish sputum, nausea and vomiting, with associated chills.  On presentation to the hospital, the patient's temperature was 104.  Influenza A came back positive.  CT scan of the chest revealed bronchitic changes.  UA is suggestive of UTI.  She is currently on IV Rocephin and Tamiflu.  Patient is still quite ill looking.  Patient was seen alongside patient's daughter.  Patient continues to report back pain.  The patient wants pain medication changed to an opiate-based medication.  Assessment & Plan:   Principal Problem:   Influenza A Active Problems:   Sepsis ([place])   Acute respiratory failure with hypoxia (HCC)   CKD (chronic kidney disease) stage 3, GFR 30-59 ml/min (HCC)   PAF (paroxysmal atrial fibrillation) (HCC)   HTN (hypertension)   Giant cell arteritis (HCC)   Coronary disease   Abnormal urinalysis Back pain.   Assessment/Plan Principal Problem:   Sepsis w/Acute respiratory failure with hypoxia 2/2 Influenza A -She presents with 2 days of cough reductive green sputum, nausea/ vomiting and generalized malaise.  Found to be strongly febrile in ER and influenza PCR A was positive -Sepsis criteria met as follows: Fever greater than 102 F, tachypnea, acute hypoxemia, serum lactate greater than 2.0, source of infection; change in GFR from 81 to 47 -Admit to stepdown level of care -Chest x-ray questions possible right middle lobe infiltrate so will obtain CT chest to better clarify -Lower respiratory tract Procalcitonin -Continue to  trend lactate until consistently below 2.0 -Continue volume resuscitation -Follow-up on blood cultures obtained in ER, obtain urinary strep and sputum culture -Treat potential pneumonia as community-acquired therefore de-escalate to Rocephin/Zithromax IV -Supportive care with oxygen and wean as tolerated -On chronic low-dose prednisone -check random serum cortisol -Patient remains significantly febrile after Tylenol; discussed with attending and although GFR lower than normal since we are actively hydrating will give a one-time dose of IV Toradol 15 mg as antipyretic -05/21/2017: Sepsis has resolved.  Patient's diet back to her baseline.  We will continue current treatment.  Patient is currently on Tamiflu and IV Rocephin.  Follow urine culture.  Monitor patient expectantly.  Active Problems:   Abnormal urinalysis -Appears consistent with UTI as well as profound dehydration -Follow-up on urine culture obtained in ER -Rocephin ordered for URI/possible pneumonia we will cover typical urinary tract organisms -05/21/2017: Follow results of urine culture.  Continue antibiotics for now.  Acute hypokalemia -In context of nausea and vomiting; also apparently takes Lasix at home -Has been given 20 mEq via IV bolus in ED -Add potassium to maintenance fluids -Once able to tolerate p.o. would give oral dosing with food -Follow labs  Mild acute kidney injury: -This is most likely prerenal.  This should resolve with IV fluids. -Continue to monitor. -Intermittent episodes of hypotension has been observed as well.  ATN cannot be entirely ruled out. -Baseline creatinine: 0.73 with baseline GFR 81.4 -Current creatinine: 1.09 with GFR 47  HTN (hypertension) -Blood pressure reading suboptimal although not hypotensive in context of sepsis physiology -Hold preadmission beta-blocker and ARB -05/21/2017: Kindly see above.  Continue to monitor.    Remote  history of PAF (paroxysmal atrial fibrillation)    -Documented as 1 solitary episode without recurrence -Currently maintaining sinus rhythm on EKG    History of giant cell arteritis  -On prednisone 10 mg daily-continue but if nausea vomiting an issue may need to transition to Solu-Medrol 40 mg IV daily until can tolerate oral medications -If developed hypotension and or random cortisol low may need to utilize stress dose steroids    Coronary disease  -Documented as mild disease involving the LAD and circumflex based on most recent cardiac catheterization in 2014 -Previously had stent to RCA placed in [place], [place] in 2006 -Hold preadmission beta-blocker in context of suboptimal blood pressure readings and sepsis physiology -We will attempt to continue preadmission Plavix but if this continues we will need to hold  Back pain, chronic: -Start patient on opiate-based analgesics as needed.    **Additional lab, imaging and/or diagnostic evaluation at discretion of supervising physician  DVT prophylaxis: Lovenox Code Status: DNR Family Communication: Family at bedside Disposition Plan: Home  Consultants:   None  Procedures:   None  Antimicrobials:   Tamiflu  IV Rocephin   Subjective: Patient is ill looking.  The patient has not back to baseline.  Objective: Vitals:   05/21/17 0318 05/21/17 0349 05/21/17 0857 05/21/17 1230  BP: (!) 130/57  (!) 133/57 (!) 113/56  Pulse:    76  Resp: (!) 22  (!) 21 19  Temp: 98.8 F (37.1 C)  98.2 F (36.8 C) 97.9 F (36.6 C)  TempSrc: Oral  Oral Oral  SpO2: 92%  97% 95%  Weight:  81.5 kg (179 lb 10.8 oz)    Height:        Intake/Output Summary (Last 24 hours) at 05/21/2017 1710 Last data filed at 05/21/2017 1314 Gross per 24 hour  Intake 2136.34 ml  Output 1500 ml  Net 636.34 ml   Filed Weights   05/20/17 1055 05/20/17 1900 05/21/17 0349  Weight: 65.8 kg (145 lb) 79.2 kg (174 lb 9.7 oz) 81.5 kg (179 lb 10.8 oz)    Examination:  General exam: Acutely ill looking.    Respiratory system: Clear to auscultation. Respiratory effort normal. Cardiovascular system: S1 & S2 heard, RRR. No JVD, murmurs, rubs, gallops or clicks. No pedal edema. Gastrointestinal system: Abdomen is nondistended, soft and nontender. No organomegaly or masses felt. Normal bowel sounds heard. Central nervous system: Alert and oriented. No focal neurological deficits. Extremities: Symmetric 5 x 5 power. Psychiatry: Judgement and insight appear normal. Mood & affect appropriate.     Data Reviewed: I have personally reviewed following labs and imaging studies  CBC: Recent Labs  Lab 05/20/17 1108 05/21/17 0340  WBC 10.8* 10.2  NEUTROABS 8.2*  --   HGB 12.2 11.6*  HCT 37.0 36.5  MCV 93.0 94.6  PLT 170 992   Basic Metabolic Panel: Recent Labs  Lab 05/20/17 1108 05/21/17 0340  NA 135 138  K 3.2* 3.7  CL 96* 107  CO2 26 18*  GLUCOSE 109* 109*  BUN 9 8  CREATININE 1.09* 1.04*  CALCIUM 8.7* 7.3*   GFR: Estimated Creatinine Clearance: 44.5 mL/min (A) (by C-G formula based on SCr of 1.04 mg/dL (H)). Liver Function Tests: Recent Labs  Lab 05/20/17 1108 05/21/17 0340  AST 32 66*  ALT 22 25  ALKPHOS 63 52  BILITOT 0.8 0.8  PROT 6.2* 5.4*  ALBUMIN 3.7 3.0*   Recent Labs  Lab 05/20/17 1108  LIPASE 38   No results for input(s): AMMONIA  in the last 168 hours. Coagulation Profile: No results for input(s): INR, PROTIME in the last 168 hours. Cardiac Enzymes: No results for input(s): CKTOTAL, CKMB, CKMBINDEX, TROPONINI in the last 168 hours. BNP (last 3 results) No results for input(s): PROBNP in the last 8760 hours. HbA1C: No results for input(s): HGBA1C in the last 72 hours. CBG: Recent Labs  Lab 05/21/17 1228  GLUCAP 142*   Lipid Profile: No results for input(s): CHOL, HDL, LDLCALC, TRIG, CHOLHDL, LDLDIRECT in the last 72 hours. Thyroid Function Tests: No results for input(s): TSH, T4TOTAL, FREET4, T3FREE, THYROIDAB in the last 72 hours. Anemia  Panel: No results for input(s): VITAMINB12, FOLATE, FERRITIN, TIBC, IRON, RETICCTPCT in the last 72 hours. Urine analysis:    Component Value Date/Time   COLORURINE YELLOW 05/20/2017 1444   APPEARANCEUR CLOUDY (A) 05/20/2017 1444   LABSPEC 1.041 (H) 05/20/2017 1444   PHURINE 5.0 05/20/2017 1444   GLUCOSEU NEGATIVE 05/20/2017 1444   GLUCOSEU NEGATIVE 07/31/2016 1018   HGBUR MODERATE (A) 05/20/2017 1444   BILIRUBINUR NEGATIVE 05/20/2017 1444   BILIRUBINUR neg 07/31/2016 1012   KETONESUR NEGATIVE 05/20/2017 1444   PROTEINUR 30 (A) 05/20/2017 1444   UROBILINOGEN 0.2 07/31/2016 1018   NITRITE POSITIVE (A) 05/20/2017 1444   LEUKOCYTESUR LARGE (A) 05/20/2017 1444   Sepsis Labs: '@LABRCNTIP'$ (procalcitonin:4,lacticidven:4)  ) Recent Results (from the past 240 hour(s))  Blood Culture (routine x 2)     Status: None (Preliminary result)   Collection Time: 05/20/17 11:07 AM  Result Value Ref Range Status   Specimen Description BLOOD LEFT FOREARM  Final   Special Requests   Final    Blood Culture adequate volume BOTTLES DRAWN AEROBIC AND ANAEROBIC   Culture   Final    NO GROWTH 1 DAY Performed at [facility], [place] [address], [place], [place] [address]    Report Status PENDING  Incomplete  Blood Culture (routine x 2)     Status: None (Preliminary result)   Collection Time: 05/20/17 11:08 AM  Result Value Ref Range Status   Specimen Description BLOOD LEFT FOREARM  Final   Special Requests   Final    Blood Culture adequate volume BOTTLES DRAWN AEROBIC ONLY   Culture   Final    NO GROWTH 1 DAY Performed at [facility], [place] [address], [place], [place] [address]    Report Status PENDING  Incomplete  Urine culture     Status: Abnormal   Collection Time: 05/20/17  2:44 PM  Result Value Ref Range Status   Specimen Description URINE, CATHETERIZED  Final   Special Requests NONE  Final   Culture (A)  Final    <10,000 COLONIES/mL INSIGNIFICANT GROWTH Performed at [facility], [place] [address], [place], [place] [address]    Report Status 05/21/2017 FINAL  Final  MRSA PCR Screening     Status: None   Collection Time: 05/20/17  7:03 PM  Result Value Ref Range Status   MRSA by PCR NEGATIVE NEGATIVE Final    Comment:        The GeneXpert MRSA Assay (FDA approved for NASAL specimens only), is one component of a comprehensive MRSA colonization surveillance program. It is not intended to diagnose MRSA infection nor to guide or monitor treatment for MRSA infections. Performed at [facility], [place] [address], [place],  [address]          Radiology Studies: Ct Chest Wo Contrast  Result Date: 05/20/2017 CLINICAL DATA:  Acute respiratory illness. Progressive weakness. Flu like symptoms. EXAM: CT CHEST WITHOUT CONTRAST TECHNIQUE: Multidetector CT imaging of the chest was performed following the standard protocol without IV contrast. COMPARISON:  11/26/2016 FINDINGS: Cardiovascular: Normal heart size. No pericardial effusion. Atherosclerosis with extensive atherosclerotic calcification of the coronaries. Mediastinum/Nodes: Remote granulomatous disease affecting left hilar and mediastinal lymph nodes. Small incidental thyroid nodules. Lungs/Pleura: Generalized airway thickening that was not seen previously. Subpleural reticulation in the lingula and left lower lobe with volume loss, atelectatic appearing. No consolidative opacities. Mild distal bronchiectasis and mucoid impaction in the lateral segment right middle lobe, chronic. No effusion or Kerley lines. Upper Abdomen: Preceding abdominal CT that was reported separately. Musculoskeletal: Degenerative changes with scoliosis. No acute osseous finding. Remote posterior right seventh and eighth rib fractures. IMPRESSION: 1. History of influenza which generalized bronchitis. Atelectatic type opacities in the lingula in the left lower lobe. No consolidating pneumonia. 2. Aortic Atherosclerosis (ICD10-I70.0).  Extensive coronary atherosclerosis. Electronically Signed   By: Monte Fantasia M.D.   On: 05/20/2017 16:35   Ct Abdomen Pelvis W Contrast  Result Date: 05/20/2017 CLINICAL DATA:  Nausea and vomiting x1 day with progressive weakness. EXAM: CT ABDOMEN AND PELVIS WITH CONTRAST TECHNIQUE: Multidetector CT imaging of the abdomen and pelvis was performed using the standard protocol following bolus administration of intravenous contrast. CONTRAST:  66m ISOVUE-300 IOPAMIDOL (ISOVUE-300) INJECTION 61% COMPARISON:  06/21/2013 FINDINGS: Lower chest: Heart size is top normal without pericardial effusion or thickening. There is dependent atelectasis at the lung bases left greater than right. There is a small hiatal hernia present. Hepatobiliary: Status post cholecystectomy. Mild hepatic steatosis. Slight surface nodularity of the liver may represent morphologic changes of cirrhosis. No space-occupying mass or biliary dilatation is noted. Pancreas: Normal Spleen: Small calcification in the anterior aspect of the spleen compatible with a granuloma. No splenomegaly or mass. Adrenals/Urinary Tract: Normal bilateral adrenal glands. No obstructive uropathy. No enhancing renal masses. Tiny bilateral subcentimeter renal hypodensities measuring on the order of 4 and 5 mm are noted, too small to further characterize but more commonly associated with small cysts nondistended urinary bladder which is slightly thick-walled in appearance likely due to underdistention. Correlate to exclude a cystitis. Stomach/Bowel: Nondistended stomach without mural thickening. Normal small bowel rotation is noted without obstruction or inflammation. Normal appearing appendix. Nondistended stool-filled large bowel with scattered colonic diverticulosis. No acute large bowel obstruction or inflammation. Vascular/Lymphatic: Moderate aortoiliac atherosclerosis. No lymphadenopathy. Reproductive: Status post hysterectomy. No adnexal masses. Other: No free air  nor free fluid. Small fat containing periumbilical hernia. Musculoskeletal: L2 through L4 lumbar spinal fusion with interbody blocks in place. Multilevel degenerative disc disease with vacuum disc phenomena of the included lower thoracic and remainder of lumbar spine. Right hip arthroplasty noted. No apparent hardware failure. No acute osseous abnormality is seen. IMPRESSION: 1. Scattered colonic diverticulosis without acute diverticulitis. No acute bowel obstruction or inflammation. 2. Mild hepatic steatosis. Slight surface nodularity of the liver may represent morphologic changes of cirrhosis. 3. Small splenic granuloma. 4. Tiny subcentimeter renal hypodensities statistically consistent with cysts but too small to further characterize. 5. Moderate aortoiliac atherosclerosis. 6. Thoracolumbar spondylosis with spinal fusion hardware L2 through L4. Electronically Signed   By: DAshley RoyaltyM.D.   On: 05/20/2017 13:38   Dg Chest Port 1 View  Result Date: 05/20/2017 CLINICAL DATA:  Sepsis.  Fever.  Vomiting. EXAM: PORTABLE CHEST 1 VIEW COMPARISON:  CT 11/26/2016.  Chest x-ray 11/25/2016. FINDINGS: Mediastinum hilar structures normal. Cardiomegaly with normal pulmonary vascularity.  Mild right mid lung field infiltrate cannot be excluded. No pleural effusion or pneumothorax. IMPRESSION: Mild right mid lung field infiltrate cannot be excluded. Electronically Signed   By: Marcello Moores  Register   On: 05/20/2017 11:43        Scheduled Meds: . clopidogrel  75 mg Oral Daily  . enoxaparin (LOVENOX) injection  40 mg Subcutaneous Q24H  . fluticasone  1 spray Each Nare Daily  . hydrocortisone sod succinate (SOLU-CORTEF) inj  50 mg Intravenous Q8H  . oseltamivir  30 mg Oral BID  . sodium chloride flush  3 mL Intravenous Q12H   Continuous Infusions: . 0.9 % NaCl with KCl 20 mEq / L 1,000 mL (05/21/17 0850)  . azithromycin Stopped (05/21/17 0104)  . cefTRIAXone (ROCEPHIN)  IV Stopped (05/20/17 2343)     LOS: 1 day      Time spent: 35 minutes.    Dana Allan, MD  [facility] Pager #: [phone] 7PM-7AM contact night coverage as above

## 2017-05-21 NOTE — Progress Notes (Signed)
Patient complaining of increased WOB and oxygen saturations were in in the high 80s. MD notified and nebulizer was given. MD was later paged again because sats did not improve and 40mg  IV lasix was given. O2 sats improved to 93 on 6L [place]. Will continue to monitor

## 2017-05-22 MED ORDER — IPRATROPIUM-ALBUTEROL 0.5-2.5 (3) MG/3ML IN SOLN
3.0000 mL | Freq: Four times a day (QID) | RESPIRATORY_TRACT | Status: DC
  Administered 2017-05-22 – 2017-05-26 (×16): 3 mL via RESPIRATORY_TRACT
  Filled 2017-05-22 (×16): qty 3

## 2017-05-22 MED ORDER — BUDESONIDE 0.25 MG/2ML IN SUSP
0.2500 mg | Freq: Two times a day (BID) | RESPIRATORY_TRACT | Status: DC
  Administered 2017-05-22 – 2017-05-26 (×8): 0.25 mg via RESPIRATORY_TRACT
  Filled 2017-05-22 (×8): qty 2

## 2017-05-22 NOTE — Progress Notes (Signed)
PROGRESS NOTE    Betty Jackson  FEO:[id number] DOB: [birth year] DOA: 05/20/2017 PCP: Eulas Post, MD  Outpatient Specialists:    Brief Narrative: Patient is an 83 year old female with past medical history significant for paroxysmal atrial fibrillation, mild coronary artery disease, hypertension, giant cell arteritis on chronic prednisone, vocal cord dysfunction, hemorrhoids and diverticulosis.  Patient is admitted with cough that is productive of greenish sputum, nausea and vomiting, with associated chills.  On presentation to the hospital, the patient's temperature was 104.  Influenza A came back positive.  CT scan of the chest revealed bronchitic changes.  UA is suggestive of UTI.  She is currently on IV Rocephin and Tamiflu.  Patient is still quite ill looking.  Patient was seen alongside patient's daughter.  Patient continues to report back pain.  The patient wants pain medication changed to an opiate-based medication.  05/22/2017: Patient seen.  Nausea and vomiting have resolved.  Patient is able to tolerate oral food.  However, patient reports some difficulty with breathing.  There is associated mild wheezing.  Will discontinue IV fluids.  Will start patient on nebs DuoNeb and Pulmicort.  Will have a low threshold to diurese the patient.  We will continue to monitor patient's respiratory status.  Will consult physical therapy.  Hopefully, the patient will be discharged back home in the next 24-48 hours.  Assessment & Plan:   Principal Problem:   Influenza A Active Problems:   Sepsis ([place])   Acute respiratory failure with hypoxia (HCC)   CKD (chronic kidney disease) stage 3, GFR 30-59 ml/min (HCC)   PAF (paroxysmal atrial fibrillation) (HCC)   HTN (hypertension)   Giant cell arteritis (HCC)   Coronary disease   Abnormal urinalysis Back pain.   Assessment/Plan Principal Problem:   Sepsis w/Acute respiratory failure with hypoxia 2/2 Influenza A -She presents with 2 days of cough  reductive green sputum, nausea/ vomiting and generalized malaise.  Found to be strongly febrile in ER and influenza PCR A was positive -Sepsis criteria met as follows: Fever greater than 102 F, tachypnea, acute hypoxemia, serum lactate greater than 2.0, source of infection; change in GFR from 81 to 47 -Admit to stepdown level of care -Chest x-ray questions possible right middle lobe infiltrate so will obtain CT chest to better clarify -Lower respiratory tract Procalcitonin -Continue to trend lactate until consistently below 2.0 -Continue volume resuscitation -Follow-up on blood cultures obtained in ER, obtain urinary strep and sputum culture -Treat potential pneumonia as community-acquired therefore de-escalate to Rocephin/Zithromax IV -Supportive care with oxygen and wean as tolerated -On chronic low-dose prednisone -check random serum cortisol -Patient remains significantly febrile after Tylenol; discussed with attending and although GFR lower than normal since we are actively hydrating will give a one-time dose of IV Toradol 15 mg as antipyretic -05/21/2017: Sepsis has resolved.  Patient's diet back to her baseline.  We will continue current treatment.  Patient is currently on Tamiflu and IV Rocephin.  Follow urine culture.  Monitor patient expectantly. -05/22/2017: DC IV fluids.  Start nebs DuoNeb and Pulmicort.  Consult physical therapy.  Low threshold to diurese patient.  Active Problems:   Abnormal urinalysis -Appears consistent with UTI as well as profound dehydration -Follow-up on urine culture obtained in ER -Rocephin ordered for URI/possible pneumonia we will cover typical urinary tract organisms -05/21/2017: Follow results of urine culture.  Continue antibiotics for now. -05/22/2017: Urine culture only grew 10,000 CFU per mL (insignificant growth)  Acute hypokalemia -In context of nausea and vomiting;  also apparently takes Lasix at home -Has been given 20 mEq via IV bolus in  ED -Add potassium to maintenance fluids -Once able to tolerate p.o. would give oral dosing with food -Follow labs  Mild acute kidney injury: -This is most likely prerenal.  This should resolve with IV fluids. -Continue to monitor. -Intermittent episodes of hypotension has been observed as well.  ATN cannot be entirely ruled out. -Baseline creatinine: 0.73 with baseline GFR 81.4 -Current creatinine: 1.09 with GFR 47 -Creatinine on 05/21/2017 was 1.04.  Renal panel in the morning.  HTN (hypertension) -Blood pressure reading suboptimal although not hypotensive in context of sepsis physiology -Hold preadmission beta-blocker and ARB -05/21/2017: Kindly see above.  Continue to monitor. -05/22/2017: Blood pressure ranges from 113-164/74 mmHg    Remote history of PAF (paroxysmal atrial fibrillation)  -Documented as 1 solitary episode without recurrence -Currently maintaining sinus rhythm on EKG    History of giant cell arteritis  -On prednisone 10 mg daily. -Continue to monitor.    Coronary disease  -Documented as mild disease involving the LAD and circumflex based on most recent cardiac catheterization in 2014 -Previously had stent to RCA placed in [place], [place] in 2006 -Hold preadmission beta-blocker in context of suboptimal blood pressure readings and sepsis physiology -We will attempt to continue preadmission Plavix but if this continues we will need to hold  Back pain, chronic: -Start patient on opiate-based analgesics as needed.    **Additional lab, imaging and/or diagnostic evaluation at discretion of supervising physician  DVT prophylaxis: Lovenox Code Status: DNR Family Communication: Family at bedside Disposition Plan: Home  Consultants:   None  Procedures:   None  Antimicrobials:   Tamiflu  IV Rocephin   Subjective: Patient is ill looking.  The patient has not back to baseline.  Objective: Vitals:   05/21/17 2320 05/22/17 0426 05/22/17 0432  05/22/17 0919  BP:  (!) 160/92  (!) 164/74  Pulse: 71 66    Resp: 17 14    Temp: (!) 97.5 F (36.4 C) 97.6 F (36.4 C)  (!) 97.4 F (36.3 C)  TempSrc: Axillary Oral  Oral  SpO2: 98% 95%    Weight:   83.3 kg (183 lb 10.3 oz)   Height:        Intake/Output Summary (Last 24 hours) at 05/22/2017 1407 Last data filed at 05/22/2017 0427 Gross per 24 hour  Intake 2228 ml  Output 550 ml  Net 1678 ml   Filed Weights   05/20/17 1900 05/21/17 0349 05/22/17 0432  Weight: 79.2 kg (174 lb 9.7 oz) 81.5 kg (179 lb 10.8 oz) 83.3 kg (183 lb 10.3 oz)    Examination:  General exam: Patient looks better today.  Not in any distress.  Awake and alert.     Respiratory system: Decreased air entry, with mild wheezing.   Cardiovascular system: S1 & S2. No pedal edema. Gastrointestinal system: Abdomen is nondistended, soft and nontender. No organomegaly or masses felt. Normal bowel sounds heard. Central nervous system: Alert and oriented. No focal neurological deficits. Extremities: Symmetric 5 x 5 power. Psychiatry: Judgement and insight appear normal. Mood & affect appropriate.   Data Reviewed: I have personally reviewed following labs and imaging studies  CBC: Recent Labs  Lab 05/20/17 1108 05/21/17 0340  WBC 10.8* 10.2  NEUTROABS 8.2*  --   HGB 12.2 11.6*  HCT 37.0 36.5  MCV 93.0 94.6  PLT 170 449   Basic Metabolic Panel: Recent Labs  Lab 05/20/17 1108 05/21/17 0340  NA 135 138  K 3.2* 3.7  CL 96* 107  CO2 26 18*  GLUCOSE 109* 109*  BUN 9 8  CREATININE 1.09* 1.04*  CALCIUM 8.7* 7.3*   GFR: Estimated Creatinine Clearance: 45 mL/min (A) (by C-G formula based on SCr of 1.04 mg/dL (H)). Liver Function Tests: Recent Labs  Lab 05/20/17 1108 05/21/17 0340  AST 32 66*  ALT 22 25  ALKPHOS 63 52  BILITOT 0.8 0.8  PROT 6.2* 5.4*  ALBUMIN 3.7 3.0*   Recent Labs  Lab 05/20/17 1108  LIPASE 38   No results for input(s): AMMONIA in the last 168 hours. Coagulation  Profile: No results for input(s): INR, PROTIME in the last 168 hours. Cardiac Enzymes: No results for input(s): CKTOTAL, CKMB, CKMBINDEX, TROPONINI in the last 168 hours. BNP (last 3 results) No results for input(s): PROBNP in the last 8760 hours. HbA1C: No results for input(s): HGBA1C in the last 72 hours. CBG: Recent Labs  Lab 05/21/17 1228  GLUCAP 142*   Lipid Profile: No results for input(s): CHOL, HDL, LDLCALC, TRIG, CHOLHDL, LDLDIRECT in the last 72 hours. Thyroid Function Tests: No results for input(s): TSH, T4TOTAL, FREET4, T3FREE, THYROIDAB in the last 72 hours. Anemia Panel: No results for input(s): VITAMINB12, FOLATE, FERRITIN, TIBC, IRON, RETICCTPCT in the last 72 hours. Urine analysis:    Component Value Date/Time   COLORURINE YELLOW 05/20/2017 1444   APPEARANCEUR CLOUDY (A) 05/20/2017 1444   LABSPEC 1.041 (H) 05/20/2017 1444   PHURINE 5.0 05/20/2017 1444   GLUCOSEU NEGATIVE 05/20/2017 1444   GLUCOSEU NEGATIVE 07/31/2016 1018   HGBUR MODERATE (A) 05/20/2017 1444   BILIRUBINUR NEGATIVE 05/20/2017 1444   BILIRUBINUR neg 07/31/2016 1012   KETONESUR NEGATIVE 05/20/2017 1444   PROTEINUR 30 (A) 05/20/2017 1444   UROBILINOGEN 0.2 07/31/2016 1018   NITRITE POSITIVE (A) 05/20/2017 1444   LEUKOCYTESUR LARGE (A) 05/20/2017 1444   Sepsis Labs: _0 (procalcitonin:4,lacticidven:4)  ) Recent Results (from the past 240 hour(s))  Blood Culture (routine x 2)     Status: None (Preliminary result)   Collection Time: 05/20/17 11:07 AM  Result Value Ref Range Status   Specimen Description BLOOD LEFT FOREARM  Final   Special Requests   Final    Blood Culture adequate volume BOTTLES DRAWN AEROBIC AND ANAEROBIC   Culture   Final    NO GROWTH 1 DAY Performed at [facility], [place] [address], [place], [place] [address]    Report Status PENDING  Incomplete  Blood Culture (routine x 2)     Status: None (Preliminary result)   Collection Time: 05/20/17 11:08 AM   Result Value Ref Range Status   Specimen Description BLOOD LEFT FOREARM  Final   Special Requests   Final    Blood Culture adequate volume BOTTLES DRAWN AEROBIC ONLY   Culture   Final    NO GROWTH 1 DAY Performed at [facility], [place] [address], [place], [place] [address]    Report Status PENDING  Incomplete  Urine culture     Status: Abnormal   Collection Time: 05/20/17  2:44 PM  Result Value Ref Range Status   Specimen Description URINE, CATHETERIZED  Final   Special Requests NONE  Final   Culture (A)  Final    <10,000 COLONIES/mL INSIGNIFICANT GROWTH Performed at [facility], [place] [address] [place], [place] [address]    Report Status 05/21/2017 FINAL  Final  MRSA PCR Screening     Status: None  Collection Time: 05/20/17  7:03 PM  Result Value Ref Range Status   MRSA by PCR NEGATIVE NEGATIVE Final    Comment:        The GeneXpert MRSA Assay (FDA approved for NASAL specimens only), is one component of a comprehensive MRSA colonization surveillance program. It is not intended to diagnose MRSA infection nor to guide or monitor treatment for MRSA infections. Performed at [facility], [place] [address], [place], [place] [address]          Radiology Studies: Ct Chest Wo Contrast  Result Date: 05/20/2017 CLINICAL DATA:  Acute respiratory illness. Progressive weakness. Flu like symptoms. EXAM: CT CHEST WITHOUT CONTRAST TECHNIQUE: Multidetector CT imaging of the chest was performed following the standard protocol without IV contrast. COMPARISON:  11/26/2016 FINDINGS: Cardiovascular: Normal heart size. No pericardial effusion. Atherosclerosis with extensive atherosclerotic calcification of the coronaries. Mediastinum/Nodes: Remote granulomatous disease affecting left hilar and mediastinal lymph nodes. Small incidental thyroid nodules. Lungs/Pleura: Generalized airway thickening that was not seen previously. Subpleural reticulation in the lingula and left lower  lobe with volume loss, atelectatic appearing. No consolidative opacities. Mild distal bronchiectasis and mucoid impaction in the lateral segment right middle lobe, chronic. No effusion or Kerley lines. Upper Abdomen: Preceding abdominal CT that was reported separately. Musculoskeletal: Degenerative changes with scoliosis. No acute osseous finding. Remote posterior right seventh and eighth rib fractures. IMPRESSION: 1. History of influenza which generalized bronchitis. Atelectatic type opacities in the lingula in the left lower lobe. No consolidating pneumonia. 2. Aortic Atherosclerosis (ICD10-I70.0). Extensive coronary atherosclerosis. Electronically Signed   By: Monte Fantasia M.D.   On: 05/20/2017 16:35        Scheduled Meds: . budesonide (PULMICORT) nebulizer solution  0.25 mg Nebulization BID  . clopidogrel  75 mg Oral Daily  . enoxaparin (LOVENOX) injection  40 mg Subcutaneous Q24H  . fluticasone  1 spray Each Nare Daily  . hydrocortisone sod succinate (SOLU-CORTEF) inj  50 mg Intravenous Q8H  . ipratropium-albuterol  3 mL Nebulization Q6H  . oseltamivir  30 mg Oral BID  . sodium chloride flush  3 mL Intravenous Q12H   Continuous Infusions: . azithromycin Stopped (05/22/17 0152)  . cefTRIAXone (ROCEPHIN)  IV Stopped (05/22/17 0006)     LOS: 2 days    Time spent: 35 minutes.    Dana Allan, MD  [facility] Pager #: [phone] 7PM-7AM contact night coverage as above

## 2017-05-23 MED ORDER — IPRATROPIUM-ALBUTEROL 0.5-2.5 (3) MG/3ML IN SOLN: 3.0000 mL | RESPIRATORY_TRACT | Status: DC | PRN

## 2017-05-23 MED ORDER — FUROSEMIDE 10 MG/ML IJ SOLN
40.0000 mg | Freq: Two times a day (BID) | INTRAMUSCULAR | Status: AC
  Administered 2017-05-24: 40 mg via INTRAVENOUS
  Filled 2017-05-23 (×2): qty 4

## 2017-05-23 NOTE — Evaluation (Signed)
Physical Therapy Evaluation Patient Details Name: Betty Jackson MRN: [id number] DOB: 1934/05/26 Today's Date: 05/23/2017   History of Present Illness  83 y.o. female admitted  admitted with productive cough, nausea and vomiting, and chills. Influenza A positive. PMH significant for paroxysmal a-fib, CAD, HTN, giant cell arteritis, on chronic prednisone, vocal cord dysfunction, hemorrhoids, and diverticulosis.   Clinical Impression  Pt presents with overall decrease in functional mobility secondary to above including impairments listed below (see PT Problem List). Bed mobility, transfers, and ambulation with RW supervision. Pt on 2L O2 [place] throughout session. Did not attempt trial off oxygen due to pt and daughter extremely anxious regarding pt's breathing.  Pt to benefit from continued acute PT to maximize safety, functional mobility, and independence prior to d/c home with HHPT.  On 2L O2 [place]: 92-93% in supine 90-92% during ambulation ~93% in sitting immediately after ambulation    Follow Up Recommendations Home health PT;Supervision for mobility/OOB    Equipment Recommendations  None recommended by PT    Recommendations for Other Services       Precautions / Restrictions Precautions Precautions: Fall Restrictions Weight Bearing Restrictions: No      Mobility  Bed Mobility Overal bed mobility: Needs Assistance Bed Mobility: Supine to Sit     Supine to sit: Supervision     General bed mobility comments: supervision for safety, use of bed rail  Transfers Overall transfer level: Needs assistance Equipment used: Rolling walker (2 wheeled) Transfers: Sit to/from Stand Sit to Stand: Supervision         General transfer comment: VCs for hand placement  Ambulation/Gait Ambulation/Gait assistance: Supervision Ambulation Distance (Feet): 150 Feet Assistive device: Rolling walker (2 wheeled) Gait Pattern/deviations: Step-through pattern;Decreased stride length;Trunk  flexed Gait velocity: decreased Gait velocity interpretation: Below normal speed for age/gender General Gait Details: slight instability noted, no LOB  Stairs            Wheelchair Mobility    Modified Rankin (Stroke Patients Only)       Balance Overall balance assessment: Mild deficits observed, not formally tested                                           Pertinent Vitals/Pain Pain Assessment: Faces Faces Pain Scale: No hurt    Home Living Family/patient expects to be discharged to:: Private residence Living Arrangements: Spouse/significant other Available Help at Discharge: Family Type of Home: House(townhouse) Home Access: Level entry     Home Layout: One level Home Equipment: Environmental consultant - 2 wheels Additional Comments: pt spouse unable to assist pt after d/c    Prior Function Level of Independence: Independent(occasionally uses RW)               Hand Dominance   Dominant Hand: Right    Extremity/Trunk Assessment   Upper Extremity Assessment Upper Extremity Assessment: Defer to OT evaluation    Lower Extremity Assessment Lower Extremity Assessment: Generalized weakness(grossly 3/5 or greater bil)       Communication   Communication: No difficulties  Cognition Arousal/Alertness: Awake/alert Behavior During Therapy: WFL for tasks assessed/performed;Anxious Overall Cognitive Status: Within Functional Limits for tasks assessed                                 General Comments: pt anxious about breathing  General Comments General comments (skin integrity, edema, etc.): Daughter present throughout session and very anxious about her mother's breathing.     Exercises     Assessment/Plan    PT Assessment Patient needs continued PT services  PT Problem List Decreased strength;Decreased activity tolerance;Decreased balance;Decreased mobility;Decreased knowledge of use of DME;Decreased safety awareness;Decreased  knowledge of precautions;Cardiopulmonary status limiting activity       PT Treatment Interventions DME instruction;Gait training;Stair training;Functional mobility training;Therapeutic activities;Therapeutic exercise;Balance training;Neuromuscular re-education;Patient/family education;Manual techniques;Modalities    PT Goals (Current goals can be found in the Care Plan section)  Acute Rehab PT Goals Patient Stated Goal: to get better PT Goal Formulation: With patient/family Time For Goal Achievement: 06/06/17 Potential to Achieve Goals: Good    Frequency Min 3X/week   Barriers to discharge        Co-evaluation               AM-PAC PT "6 Clicks" Daily Activity  Outcome Measure Difficulty turning over in bed (including adjusting bedclothes, sheets and blankets)?: A Little Difficulty moving from lying on back to sitting on the side of the bed? : A Little Difficulty sitting down on and standing up from a chair with arms (e.g., wheelchair, bedside commode, etc,.)?: A Little Help needed moving to and from a bed to chair (including a wheelchair)?: A Little Help needed walking in hospital room?: A Little Help needed climbing 3-5 steps with a railing? : A Lot 6 Click Score: 17    End of Session Equipment Utilized During Treatment: Gait belt;Oxygen Activity Tolerance: Patient tolerated treatment well Patient left: in chair;with call bell/phone within reach;with family/visitor present Nurse Communication: Mobility status PT Visit Diagnosis: Unsteadiness on feet (R26.81);Muscle weakness (generalized) (M62.81)    Time: 1308-6578 PT Time Calculation (min) (ACUTE ONLY): 32 min   Charges:   PT Evaluation $PT Eval Moderate Complexity: 1 Mod PT Treatments $Gait Training: 8-22 mins   PT G Codes:        Vic Ripper, SPT  Vic Ripper 05/23/2017, 3:45 PM

## 2017-05-23 NOTE — Progress Notes (Signed)
1330-patient ordered Lasix 40 mg IV. This nurse went in to give lasix patient refused. Patient states "I had my daughter bring me one pill". This nurse educated patient on not taking medication from home. Patient verbalized understanding.

## 2017-05-23 NOTE — Progress Notes (Signed)
PROGRESS NOTE    Betty Jackson  DGU:[id number] DOB: 12/30/1934 DOA: 05/20/2017 PCP: Eulas Post, MD  Outpatient Specialists:    Brief Narrative: Patient is an 83 year old female with past medical history significant for paroxysmal atrial fibrillation, mild coronary artery disease, hypertension, giant cell arteritis on chronic prednisone, vocal cord dysfunction, hemorrhoids and diverticulosis.  Patient is admitted with cough that is productive of greenish sputum, nausea and vomiting, with associated chills.  On presentation to the hospital, the patient's temperature was 104.  Influenza A came back positive.  CT scan of the chest revealed bronchitic changes.  UA is suggestive of UTI.  She is currently on IV Rocephin and Tamiflu.  Patient is still quite ill looking.  Patient was seen alongside patient's daughter.  Patient continues to report back pain.  The patient wants pain medication changed to an opiate-based medication.  05/22/2017: Patient seen.  Nausea and vomiting have resolved.  Patient is able to tolerate oral food.  However, patient reports some difficulty with breathing.  There is associated mild wheezing.  Will discontinue IV fluids.  Will start patient on nebs DuoNeb and Pulmicort.  Will have a low threshold to diurese the patient.  We will continue to monitor patient's respiratory status.  Will consult physical therapy.  Hopefully, the patient will be discharged back home in the next 24-48 hours.  05/23/2017: Patient seen alongside patient's daughter.  Patient is complaining of leg edema.  Patient continues to wheeze.  Patient has not back to her baseline.  IV fluid has been discontinued.  Will cautiously diurese patient.  Will order 2 doses of IV Lasix 40 mg every 12.  We will continue to monitor renal function and electrolytes.  Assessment & Plan:   Principal Problem:   Influenza A Active Problems:   Sepsis ([place])   Acute respiratory failure with hypoxia (HCC)   CKD (chronic  kidney disease) stage 3, GFR 30-59 ml/min (HCC)   PAF (paroxysmal atrial fibrillation) (HCC)   HTN (hypertension)   Giant cell arteritis (HCC)   Coronary disease   Abnormal urinalysis Back pain.   Assessment/Plan Principal Problem:   Sepsis w/Acute respiratory failure with hypoxia 2/2 Influenza A -She presents with 2 days of cough reductive green sputum, nausea/ vomiting and generalized malaise.  Found to be strongly febrile in ER and influenza PCR A was positive -Sepsis criteria met as follows: Fever greater than 102 F, tachypnea, acute hypoxemia, serum lactate greater than 2.0, source of infection; change in GFR from 81 to 47 -Admit to stepdown level of care -Chest x-ray questions possible right middle lobe infiltrate so will obtain CT chest to better clarify -Lower respiratory tract Procalcitonin -Continue to trend lactate until consistently below 2.0 -Continue volume resuscitation -Follow-up on blood cultures obtained in ER, obtain urinary strep and sputum culture -Treat potential pneumonia as community-acquired therefore de-escalate to Rocephin/Zithromax IV -Supportive care with oxygen and wean as tolerated -On chronic low-dose prednisone -check random serum cortisol -Patient remains significantly febrile after Tylenol; discussed with attending and although GFR lower than normal since we are actively hydrating will give a one-time dose of IV Toradol 15 mg as antipyretic -05/21/2017: Sepsis has resolved.  Patient's diet back to her baseline.  We will continue current treatment.  Patient is currently on Tamiflu and IV Rocephin.  Follow urine culture.  Monitor patient expectantly. -05/22/2017: DC IV fluids.  Start nebs DuoNeb and Pulmicort.  Consult physical therapy.  Low threshold to diurese patient. 05/23/2017: Mild wheezing persists.  Will  continue neb treatments and steroids.  IV fluid has already been discontinued a day prior.  Will start patient on IV Solu-Medrol.  Wheezing is likely  secondary to bronchitis following the influenza.  Will manage expectantly.  Active Problems:   Abnormal urinalysis -Appears consistent with UTI as well as profound dehydration -Follow-up on urine culture obtained in ER -Rocephin ordered for URI/possible pneumonia we will cover typical urinary tract organisms -05/21/2017: Follow results of urine culture.  Continue antibiotics for now. -05/22/2017: Urine culture only grew 10,000 CFU per mL (insignificant growth)   Acute hypokalemia -In context of nausea and vomiting; also apparently takes Lasix at home -Has been given 20 mEq via IV bolus in ED -Add potassium to maintenance fluids -Once able to tolerate p.o. would give oral dosing with food -Follow labs -Hypokalemia has resolved.  Mild acute kidney injury: -This is most likely prerenal.  This should resolve with IV fluids. -Continue to monitor. -Intermittent episodes of hypotension has been observed as well.  ATN cannot be entirely ruled out. -Baseline creatinine: 0.73 with baseline GFR 81.4 -Current creatinine: 1.09 with GFR 47 -Creatinine on 05/21/2017 was 1.04.  Renal panel in the morning. 05/23/2017: Continue to monitor.  HTN (hypertension) -Blood pressure reading suboptimal although not hypotensive in context of sepsis physiology -Hold preadmission beta-blocker and ARB -05/21/2017: Kindly see above.  Continue to monitor. -05/22/2017: Blood pressure ranges from 113-164/74 mmHg The blood pressure is optimize.    Remote history of PAF (paroxysmal atrial fibrillation)  -Documented as 1 solitary episode without recurrence -Currently maintaining sinus rhythm on EKG    History of giant cell arteritis  -On prednisone 10 mg daily. -Continue to monitor.    Coronary disease  -Documented as mild disease involving the LAD and circumflex based on most recent cardiac catheterization in 2014 -Previously had stent to RCA placed in [place], [place] in 2006 -Hold preadmission beta-blocker in  context of suboptimal blood pressure readings and sepsis physiology -We will attempt to continue preadmission Plavix but if this continues we will need to hold  Back pain, chronic: -Start patient on opiate-based analgesics as needed.    **Additional lab, imaging and/or diagnostic evaluation at discretion of supervising physician  DVT prophylaxis: Lovenox Code Status: DNR Family Communication: Family at bedside Disposition Plan: Home  Consultants:   None  Procedures:   None  Antimicrobials:   Tamiflu  IV Rocephin   Subjective: Patient is ill looking.  The patient has not back to baseline.  Objective: Vitals:   05/23/17 1300 05/23/17 1306 05/23/17 1500 05/23/17 1559  BP: (!) 181/8 (!) 166/79  (!) 151/74  Pulse: 97 86  79  Resp: _0 Temp: 97.8 F (36.6 C)   (!) 97.5 F (36.4 C)  TempSrc: Oral   Oral  SpO2: 91% 90% 97% 96%  Weight:      Height:        Intake/Output Summary (Last 24 hours) at 05/23/2017 1636 Last data filed at 05/23/2017 1300 Gross per 24 hour  Intake 603 ml  Output -  Net 603 ml   Filed Weights   05/21/17 0349 05/22/17 0432 05/23/17 0500  Weight: 81.5 kg (179 lb 10.8 oz) 83.3 kg (183 lb 10.3 oz) 84 kg (185 lb 3 oz)    Examination:  General exam: Patient looks better today.  Not in any distress.  Awake and alert.     Respiratory system: Expiratory wheeze, mild to was an expiratory wheeze.  Cardiovascular system: S1 & S2.  Minimal  pedal edema. Gastrointestinal system: Abdomen is nondistended, soft and nontender. No organomegaly or masses felt. Normal bowel sounds heard. Central nervous system: Alert and oriented. No focal neurological deficits. Extremities: Minimal leg edema.    Data Reviewed: I have personally reviewed following labs and imaging studies  CBC: Recent Labs  Lab 05/20/17 1108 05/21/17 0340  WBC 10.8* 10.2  NEUTROABS 8.2*  --   HGB 12.2 11.6*  HCT 37.0 36.5  MCV 93.0 94.6  PLT 170 741   Basic Metabolic  Panel: Recent Labs  Lab 05/20/17 1108 05/21/17 0340  NA 135 138  K 3.2* 3.7  CL 96* 107  CO2 26 18*  GLUCOSE 109* 109*  BUN 9 8  CREATININE 1.09* 1.04*  CALCIUM 8.7* 7.3*   GFR: Estimated Creatinine Clearance: 45.2 mL/min (A) (by C-G formula based on SCr of 1.04 mg/dL (H)). Liver Function Tests: Recent Labs  Lab 05/20/17 1108 05/21/17 0340  AST 32 66*  ALT 22 25  ALKPHOS 63 52  BILITOT 0.8 0.8  PROT 6.2* 5.4*  ALBUMIN 3.7 3.0*   Recent Labs  Lab 05/20/17 1108  LIPASE 38   No results for input(s): AMMONIA in the last 168 hours. Coagulation Profile: No results for input(s): INR, PROTIME in the last 168 hours. Cardiac Enzymes: No results for input(s): CKTOTAL, CKMB, CKMBINDEX, TROPONINI in the last 168 hours. BNP (last 3 results) No results for input(s): PROBNP in the last 8760 hours. HbA1C: No results for input(s): HGBA1C in the last 72 hours. CBG: Recent Labs  Lab 05/21/17 1228  GLUCAP 142*   Lipid Profile: No results for input(s): CHOL, HDL, LDLCALC, TRIG, CHOLHDL, LDLDIRECT in the last 72 hours. Thyroid Function Tests: No results for input(s): TSH, T4TOTAL, FREET4, T3FREE, THYROIDAB in the last 72 hours. Anemia Panel: No results for input(s): VITAMINB12, FOLATE, FERRITIN, TIBC, IRON, RETICCTPCT in the last 72 hours. Urine analysis:    Component Value Date/Time   COLORURINE YELLOW 05/20/2017 1444   APPEARANCEUR CLOUDY (A) 05/20/2017 1444   LABSPEC 1.041 (H) 05/20/2017 1444   PHURINE 5.0 05/20/2017 1444   GLUCOSEU NEGATIVE 05/20/2017 1444   GLUCOSEU NEGATIVE 07/31/2016 1018   HGBUR MODERATE (A) 05/20/2017 1444   BILIRUBINUR NEGATIVE 05/20/2017 1444   BILIRUBINUR neg 07/31/2016 1012   KETONESUR NEGATIVE 05/20/2017 1444   PROTEINUR 30 (A) 05/20/2017 1444   UROBILINOGEN 0.2 07/31/2016 1018   NITRITE POSITIVE (A) 05/20/2017 1444   LEUKOCYTESUR LARGE (A) 05/20/2017 1444   Sepsis Labs: _0 (procalcitonin:4,lacticidven:4)  ) Recent Results  (from the past 240 hour(s))  Blood Culture (routine x 2)     Status: None (Preliminary result)   Collection Time: 05/20/17 11:07 AM  Result Value Ref Range Status   Specimen Description BLOOD LEFT FOREARM  Final   Special Requests   Final    Blood Culture adequate volume BOTTLES DRAWN AEROBIC AND ANAEROBIC   Culture   Final    NO GROWTH 3 DAYS Performed at [facility], [place] [address], [place], [place] [address]    Report Status PENDING  Incomplete  Blood Culture (routine x 2)     Status: None (Preliminary result)   Collection Time: 05/20/17 11:08 AM  Result Value Ref Range Status   Specimen Description BLOOD LEFT FOREARM  Final   Special Requests   Final    Blood Culture adequate volume BOTTLES DRAWN AEROBIC ONLY   Culture   Final    NO GROWTH 3 DAYS Performed at [facility], [place]  [address], [place], [place] [address]    Report Status PENDING  Incomplete  Urine culture     Status: Abnormal   Collection Time: 05/20/17  2:44 PM  Result Value Ref Range Status   Specimen Description URINE, CATHETERIZED  Final   Special Requests NONE  Final   Culture (A)  Final    <10,000 COLONIES/mL INSIGNIFICANT GROWTH Performed at [facility], [place] [address] [place], [place] [address]    Report Status 05/21/2017 FINAL  Final  MRSA PCR Screening     Status: None   Collection Time: 05/20/17  7:03 PM  Result Value Ref Range Status   MRSA by PCR NEGATIVE NEGATIVE Final    Comment:        The GeneXpert MRSA Assay (FDA approved for NASAL specimens only), is one component of a comprehensive MRSA colonization surveillance program. It is not intended to diagnose MRSA infection nor to guide or monitor treatment for MRSA infections. Performed at [facility], [place] [address] [place], [place] [address]          Radiology Studies: No results found.      Scheduled Meds: . budesonide (PULMICORT) nebulizer solution  0.25 mg Nebulization BID  . clopidogrel  75  mg Oral Daily  . enoxaparin (LOVENOX) injection  40 mg Subcutaneous Q24H  . fluticasone  1 spray Each Nare Daily  . furosemide  40 mg Intravenous Q12H  . hydrocortisone sod succinate (SOLU-CORTEF) inj  50 mg Intravenous Q8H  . ipratropium-albuterol  3 mL Nebulization Q6H  . oseltamivir  30 mg Oral BID  . sodium chloride flush  3 mL Intravenous Q12H   Continuous Infusions: . azithromycin Stopped (05/22/17 2341)  . cefTRIAXone (ROCEPHIN)  IV Stopped (05/23/17 0115)     LOS: 3 days    Time spent: 35 minutes.    Dana Allan, MD  [facility] Pager #: [phone] 7PM-7AM contact night coverage as above

## 2017-05-24 DIAGNOSIS — R8290 Unspecified abnormal findings in urine: Secondary | ICD-10-CM

## 2017-05-24 DIAGNOSIS — N183 Chronic kidney disease, stage 3 (moderate): Secondary | ICD-10-CM

## 2017-05-24 DIAGNOSIS — J9601 Acute respiratory failure with hypoxia: Secondary | ICD-10-CM

## 2017-05-24 LAB — RENAL FUNCTION PANEL
Albumin: 3 g/dL — ABNORMAL LOW (ref 3.5–5.0)
Anion gap: 13 (ref 5–15)
BUN: 8 mg/dL (ref 6–20)
CO2: 20 mmol/L — ABNORMAL LOW (ref 22–32)
Calcium: 8.2 mg/dL — ABNORMAL LOW (ref 8.9–10.3)
Chloride: 104 mmol/L (ref 101–111)
Creatinine, Ser: 0.68 mg/dL (ref 0.44–1.00)
GFR calc Af Amer: >60 mL/min (ref >60)
GFR calc non Af Amer: >60 mL/min (ref >60)
Glucose, Bld: 123 mg/dL — ABNORMAL HIGH (ref 65–99)
Phosphorus: 2.5 mg/dL (ref 2.5–4.6)
Potassium: 3.2 mmol/L — ABNORMAL LOW (ref 3.5–5.1)
Sodium: 137 mmol/L (ref 135–145)

## 2017-05-24 MED ORDER — TORSEMIDE 20 MG PO TABS
20.0000 mg | ORAL_TABLET | Freq: Every day | ORAL | Status: AC
  Administered 2017-05-24 – 2017-05-26 (×3): 20 mg via ORAL
  Filled 2017-05-24 (×3): qty 1

## 2017-05-24 MED ORDER — POTASSIUM CHLORIDE CRYS ER 20 MEQ PO TBCR
40.0000 meq | EXTENDED_RELEASE_TABLET | ORAL | Status: AC
  Administered 2017-05-24 (×2): 40 meq via ORAL
  Filled 2017-05-24 (×2): qty 2

## 2017-05-24 MED ORDER — AZITHROMYCIN 250 MG PO TABS: 500.0000 mg | ORAL_TABLET | Freq: Every day | ORAL | Status: DC

## 2017-05-24 NOTE — Progress Notes (Addendum)
PROGRESS NOTE    Betty Jackson  OFB:[id number] DOB: 1934/10/24 DOA: 05/20/2017 PCP: Eulas Post, MD  Outpatient Specialists:    Brief Narrative: Patient is an 83 year old female with past medical history significant for paroxysmal atrial fibrillation, mild coronary artery disease, hypertension, giant cell arteritis on chronic prednisone, vocal cord dysfunction, hemorrhoids and diverticulosis.  Patient is admitted with cough that is productive of greenish sputum, nausea and vomiting, with associated chills.  Patient found to be positive for influenza A/right middle lobe infiltrate, admitted for sepsis and pneumonia     Assessment & Plan:   Principal Problem:   Influenza A Active Problems:   Sepsis ([place])   Acute respiratory failure with hypoxia (HCC)   CKD (chronic kidney disease) stage 3, GFR 30-59 ml/min (HCC)   PAF (paroxysmal atrial fibrillation) (HCC)   HTN (hypertension)   Giant cell arteritis (HCC)   Coronary disease   Abnormal urinalysis Back pain.   Assessment/Plan    Sepsis w/Acute respiratory failure with hypoxia 2/2 Influenza A  Found to be positive for influenza PCR A  , no pneumonia on CT -Initially met Sepsis criteria  : Fever greater than 102 F, tachypnea, acute hypoxemia, serum lactate greater than 2.0, source of infection;   - continue  step down -Chest x-ray questions possible right middle lobe infiltrate  , CT of the chest completed on 2/21 showed  Atelectatic type opacities in the lingula in the left lower lobe. No consolidating pneumonia.  Initial pro-calcitonin 1.46 -Improved lactic acidosis during this hospitalization with volume resuscitation  Blood culture no growth so far, urine culture with insignificant growth - Initially started on treatment for community-acquired , vancomycin, Rocephin, azithromycin, all antibiotics discontinued 2/25 ,  -Currently needing 2-3 L of oxygen Patient initially started on stress dose steroids will DC Solu-Cortef  and switch to prednisone 60 mg a day as the patient is still wheezing       Abnormal urinalysis  Urine culture nonspecific with insignificant growth  Received Rocephin  2/21-2/25    Acute hypokalemia 2.7 Replete and recheck   Mild acute kidney injury: -This is most likely prerenal.  Improving. Resolved after IV hydration -Continue to monitor.   HTN (hypertension)  Initial hypotension resolved, blood pressure now stable   Resume metoprolol as the patient is having PVCs with A. fib  Rest of her home medications on hold  Remote history of PAF (paroxysmal atrial fibrillation)  Likely exacerbated by hypokalemia, check potassium and check magnesium resume metoprolol Continue to monitor on telemetry, obtain 2-D echo the last 2-D echo was done in 2014  History of giant cell arteritis  -On prednisone 10 mg daily. -Continue to monitor.  Coronary disease disease: -Documented as mild disease involving the LAD and circumflex based on most recent cardiac catheterization in 2014 -Previously had stent to RCA placed in [place], [place] in 2006 -resume  beta-blocker -We will attempt to continue preadmission Plavix but if this continues we will need to hold  Back pain, chronic: Start patient on opiate-based analgesics as needed.      DVT prophylaxis: Lovenox Code Status: DNR Family Communication: no  Family at bedside Disposition Plan: [place] in  2-3 days , PT/OT eval, very frail  Consultants:   None   Procedures:   None  Antimicrobials:  Anti-infectives (From admission, onward)   Start     Dose/Rate Route Frequency Ordered Stop   05/24/17 2200  azithromycin (ZITHROMAX) tablet 500 mg  Status:  Discontinued     500  mg Oral Daily at bedtime 05/24/17 0956 05/24/17 1642   05/21/17 1200  vancomycin (VANCOCIN) IVPB 1000 mg/200 mL premix  Status:  Discontinued     1,000 mg 200 mL/hr over 60 Minutes Intravenous Every 24 hours 05/20/17 1331 05/20/17 1538   05/21/17 0000   cefTRIAXone (ROCEPHIN) 1 g in sodium chloride 0.9 % 100 mL IVPB  Status:  Discontinued     1 g 200 mL/hr over 30 Minutes Intravenous Every 24 hours 05/20/17 1534 05/24/17 1642   05/21/17 0000  azithromycin (ZITHROMAX) 500 mg in sodium chloride 0.9 % 250 mL IVPB  Status:  Discontinued     500 mg 250 mL/hr over 60 Minutes Intravenous Every 24 hours 05/20/17 1534 05/24/17 0956   05/20/17 2200  oseltamivir (TAMIFLU) capsule 30 mg     30 mg Oral 2 times daily 05/20/17 1549 05/25/17 2159   05/20/17 1900  piperacillin-tazobactam (ZOSYN) IVPB 3.375 g  Status:  Discontinued     3.375 g 12.5 mL/hr over 240 Minutes Intravenous Every 8 hours 05/20/17 1158 05/20/17 1538   05/20/17 1445  oseltamivir (TAMIFLU) capsule 75 mg  Status:  Discontinued     75 mg Oral  Once 05/20/17 1444 05/20/17 1527   05/20/17 1115  piperacillin-tazobactam (ZOSYN) IVPB 3.375 g     3.375 g 100 mL/hr over 30 Minutes Intravenous  Once 05/20/17 1106 05/20/17 1210   05/20/17 1115  vancomycin (VANCOCIN) IVPB 1000 mg/200 mL premix     1,000 mg 200 mL/hr over 60 Minutes Intravenous  Once 05/20/17 1106 05/20/17 1332            Subjective:  patient remains in atrial fibrillation with PVCs,shortness of breath this morning Oxygen requirements have increased this morning, patient unable to speak in full sentences, wheezing  Objective: Vitals:   05/23/17 2033 05/24/17 0005 05/24/17 0133 05/24/17 0355  BP:  136/72  (!) 134/55  Pulse:  69  62  Resp:  15  12  Temp:  (!) 97.4 F (36.3 C)  97.8 F (36.6 C)  TempSrc:  Oral  Oral  SpO2: 97% 95% 95% 93%  Weight:    86.7 kg (191 lb 2.2 oz)  Height:        Intake/Output Summary (Last 24 hours) at 05/24/2017 0808 Last data filed at 05/24/2017 7124 Gross per 24 hour  Intake 1193 ml  Output 3150 ml  Net -1957 ml   Filed Weights   05/22/17 0432 05/23/17 0500 05/24/17 0355  Weight: 83.3 kg (183 lb 10.3 oz) 84 kg (185 lb 3 oz) 86.7 kg (191 lb 2.2 oz)    Examination:  General  exam: Patient looks better today.  Not in any distress.  Awake and alert.     Respiratory system: Expiratory wheeze, mild to was an expiratory wheeze.  Cardiovascular system: S1 & S2.  Minimal pedal edema. Gastrointestinal system: Abdomen is nondistended, soft and nontender. No organomegaly or masses felt. Normal bowel sounds heard. Central nervous system: Alert and oriented. No focal neurological deficits. Extremities: Minimal leg edema.    Data Reviewed: I have personally reviewed following labs and imaging studies  CBC: Recent Labs  Lab 05/20/17 1108 05/21/17 0340  WBC 10.8* 10.2  NEUTROABS 8.2*  --   HGB 12.2 11.6*  HCT 37.0 36.5  MCV 93.0 94.6  PLT 170 580   Basic Metabolic Panel: Recent Labs  Lab 05/20/17 1108 05/21/17 0340 05/24/17 0232  NA 135 138 137  K 3.2* 3.7 3.2*  CL 96* 107 104  CO2 26 18* 20*  GLUCOSE 109* 109* 123*  BUN _0 CREATININE 1.09* 1.04* 0.68  CALCIUM 8.7* 7.3* 8.2*  PHOS  --   --  2.5   GFR: Estimated Creatinine Clearance: 59.8 mL/min (by C-G formula based on SCr of 0.68 mg/dL). Liver Function Tests: Recent Labs  Lab 05/20/17 1108 05/21/17 0340 05/24/17 0232  AST 32 66*  --   ALT 22 25  --   ALKPHOS 63 52  --   BILITOT 0.8 0.8  --   PROT 6.2* 5.4*  --   ALBUMIN 3.7 3.0* 3.0*   Recent Labs  Lab 05/20/17 1108  LIPASE 38   No results for input(s): AMMONIA in the last 168 hours. Coagulation Profile: No results for input(s): INR, PROTIME in the last 168 hours. Cardiac Enzymes: No results for input(s): CKTOTAL, CKMB, CKMBINDEX, TROPONINI in the last 168 hours. BNP (last 3 results) No results for input(s): PROBNP in the last 8760 hours. HbA1C: No results for input(s): HGBA1C in the last 72 hours. CBG: Recent Labs  Lab 05/21/17 1228  GLUCAP 142*   Lipid Profile: No results for input(s): CHOL, HDL, LDLCALC, TRIG, CHOLHDL, LDLDIRECT in the last 72 hours. Thyroid Function Tests: No results for input(s): TSH, T4TOTAL, FREET4,  T3FREE, THYROIDAB in the last 72 hours. Anemia Panel: No results for input(s): VITAMINB12, FOLATE, FERRITIN, TIBC, IRON, RETICCTPCT in the last 72 hours. Urine analysis:    Component Value Date/Time   COLORURINE YELLOW 05/20/2017 1444   APPEARANCEUR CLOUDY (A) 05/20/2017 1444   LABSPEC 1.041 (H) 05/20/2017 1444   PHURINE 5.0 05/20/2017 1444   GLUCOSEU NEGATIVE 05/20/2017 1444   GLUCOSEU NEGATIVE 07/31/2016 1018   HGBUR MODERATE (A) 05/20/2017 1444   BILIRUBINUR NEGATIVE 05/20/2017 1444   BILIRUBINUR neg 07/31/2016 1012   KETONESUR NEGATIVE 05/20/2017 1444   PROTEINUR 30 (A) 05/20/2017 1444   UROBILINOGEN 0.2 07/31/2016 1018   NITRITE POSITIVE (A) 05/20/2017 1444   LEUKOCYTESUR LARGE (A) 05/20/2017 1444   Sepsis Labs: _1 (procalcitonin:4,lacticidven:4)  ) Recent Results (from the past 240 hour(s))  Blood Culture (routine x 2)     Status: None (Preliminary result)   Collection Time: 05/20/17 11:07 AM  Result Value Ref Range Status   Specimen Description BLOOD LEFT FOREARM  Final   Special Requests   Final    Blood Culture adequate volume BOTTLES DRAWN AEROBIC AND ANAEROBIC   Culture   Final    NO GROWTH 3 DAYS Performed at [facility], [place] [address], [place], [place] [address]    Report Status PENDING  Incomplete  Blood Culture (routine x 2)     Status: None (Preliminary result)   Collection Time: 05/20/17 11:08 AM  Result Value Ref Range Status   Specimen Description BLOOD LEFT FOREARM  Final   Special Requests   Final    Blood Culture adequate volume BOTTLES DRAWN AEROBIC ONLY   Culture   Final    NO GROWTH 3 DAYS Performed at [facility], [place] [address] [place], [place] [address]    Report Status PENDING  Incomplete  Urine culture     Status: Abnormal   Collection Time: 05/20/17  2:44 PM  Result Value Ref Range Status   Specimen Description URINE, CATHETERIZED  Final   Special Requests NONE  Final   Culture (A)  Final    <10,000  COLONIES/mL INSIGNIFICANT GROWTH Performed at [facility], [place] [address], [place], [place] [address]  Report Status 05/21/2017 FINAL  Final  MRSA PCR Screening     Status: None   Collection Time: 05/20/17  7:03 PM  Result Value Ref Range Status   MRSA by PCR NEGATIVE NEGATIVE Final    Comment:        The GeneXpert MRSA Assay (FDA approved for NASAL specimens only), is one component of a comprehensive MRSA colonization surveillance program. It is not intended to diagnose MRSA infection nor to guide or monitor treatment for MRSA infections. Performed at [facility], [place] [address], [place], [place] [address]          Radiology Studies: No results found.      Scheduled Meds: . budesonide (PULMICORT) nebulizer solution  0.25 mg Nebulization BID  . clopidogrel  75 mg Oral Daily  . enoxaparin (LOVENOX) injection  40 mg Subcutaneous Q24H  . fluticasone  1 spray Each Nare Daily  . furosemide  40 mg Intravenous Q12H  . hydrocortisone sod succinate (SOLU-CORTEF) inj  50 mg Intravenous Q8H  . ipratropium-albuterol  3 mL Nebulization Q6H  . oseltamivir  30 mg Oral BID  . potassium chloride  40 mEq Oral Q4H  . sodium chloride flush  3 mL Intravenous Q12H   Continuous Infusions: . azithromycin Stopped (05/24/17 0123)  . cefTRIAXone (ROCEPHIN)  IV Stopped (05/24/17 0200)     LOS: 4 days    Time spent: 25 minutes.      [facility] Pager #: [phone] 7PM-7AM contact night coverage as above

## 2017-05-24 NOTE — Care Management Important Message (Signed)
Important Message  Patient Details  Name: Betty Jackson MRN: [id number] Date of Birth: 05/30/1934   Medicare Important Message Given:  Yes    Zenon Mayo, RN 05/24/2017, 3:49 PM

## 2017-05-24 NOTE — Progress Notes (Signed)
Physical Therapy Treatment Patient Details Name: Betty Jackson MRN: [id number] DOB: 1934-07-23 Today's Date: 05/24/2017    History of Present Illness 83 y.o. female admitted  admitted with productive cough, nausea and vomiting, and chills. Influenza A positive. PMH significant for paroxysmal a-fib, CAD, HTN, giant cell arteritis, on chronic prednisone, vocal cord dysfunction, hemorrhoids, and diverticulosis.     PT Comments    Pt progressing well towards functional mobility goals. Pt overall less anxious about her breathing today. Pt ambulated supervision with RW ~180 ft (see trial on RA below). Will continue to follow acutely and progress as tolerated to maximize functional mobility, safety, and independence prior to d/c home with HHPT.    96% on 2L [place] O2 seated at rest 96% RA seated at rest desat to 87% during ambulation after ~100 ft recovered quickly 96% seated on RA following ambulation 97% on 2L [place] O2 at completion of session   Follow Up Recommendations  Home health PT;Supervision for mobility/OOB     Equipment Recommendations  None recommended by PT    Recommendations for Other Services       Precautions / Restrictions Precautions Precautions: Fall Restrictions Weight Bearing Restrictions: No    Mobility  Bed Mobility               General bed mobility comments: pt on BSC upon PT arrival  Transfers Overall transfer level: Needs assistance Equipment used: Rolling walker (2 wheeled) Transfers: Sit to/from Stand Sit to Stand: Supervision         General transfer comment: VCs for hand placement  Ambulation/Gait Ambulation/Gait assistance: Supervision Ambulation Distance (Feet): 180 Feet Assistive device: Rolling walker (2 wheeled) Gait Pattern/deviations: Step-through pattern;Decreased stride length;Trunk flexed Gait velocity: decreased Gait velocity interpretation: Below normal speed for age/gender General Gait Details: O2 drop to 87% during  ambulation with 15-20 sec standing rest break to recover>90% on RA. slight instability noted, no episodes of LOB   Stairs            Wheelchair Mobility    Modified Rankin (Stroke Patients Only)       Balance Overall balance assessment: Mild deficits observed, not formally tested                                          Cognition Arousal/Alertness: Awake/alert Behavior During Therapy: [facility] for tasks assessed/performed;Anxious Overall Cognitive Status: Within Functional Limits for tasks assessed                                 General Comments: pt less anxious about breathing than yesterday      Exercises      General Comments General comments (skin integrity, edema, etc.): on RA throughout session. placed back on room O2 at end of session.      Pertinent Vitals/Pain Pain Assessment: No/denies pain    Home Living                      Prior Function            PT Goals (current goals can now be found in the care plan section) Acute Rehab PT Goals Patient Stated Goal: to get better PT Goal Formulation: With patient/family Time For Goal Achievement: 06/06/17 Potential to Achieve Goals: Good Progress towards PT goals: Progressing toward goals  Frequency    Min 3X/week      PT Plan Current plan remains appropriate    Co-evaluation              AM-PAC PT "6 Clicks" Daily Activity  Outcome Measure  Difficulty turning over in bed (including adjusting bedclothes, sheets and blankets)?: A Little Difficulty moving from lying on back to sitting on the side of the bed? : A Little Difficulty sitting down on and standing up from a chair with arms (e.g., wheelchair, bedside commode, etc,.)?: None Help needed moving to and from a bed to chair (including a wheelchair)?: A Little Help needed walking in hospital room?: A Little Help needed climbing 3-5 steps with a railing? : A Lot 6 Click Score: 18    End of  Session Equipment Utilized During Treatment: Gait belt;Oxygen Activity Tolerance: Patient tolerated treatment well Patient left: in chair;with call bell/phone within reach Nurse Communication: Mobility status PT Visit Diagnosis: Unsteadiness on feet (R26.81);Muscle weakness (generalized) (M62.81)     Time: [id number] PT Time Calculation (min) (ACUTE ONLY): 22 min  Charges:  $Gait Training: 8-22 mins                    G Codes:       Betty Jackson, SPT  Betty Jackson 05/24/2017, 11:32 AM

## 2017-05-24 NOTE — Care Management Note (Signed)
Case Management Note  Patient Details  Name: Betty Jackson MRN: [id number] Date of Birth: 24-Sep-1934  Subjective/Objective:   From home with spouse, pta indep, she has a w/chair, a rollator at home.  She has PCP and medication coverage.  Patient states she would like to have a transportation chair , NCM informed MD and left narrative form on patients chart for MD to make a note in chart if he feels this is appropriate for patient.                 Action/Plan: DC home with HHPT when medically ready.  Expected Discharge Date:  05/24/17               Expected Discharge Plan:  [place]  In-House Referral:     Discharge planning Services  CM Consult  Post Acute Care Choice:    Choice offered to:     DME Arranged:    DME Agency:     HH Arranged:  PT [place]:  [place]  Status of Service:     If discussed at [place] of Stay Meetings, dates discussed:    Additional Comments:  Zenon Mayo, RN 05/24/2017, 4:25 PM

## 2017-05-25 ENCOUNTER — Inpatient Hospital Stay (HOSPITAL_COMMUNITY): Payer: Medicare Other

## 2017-05-25 DIAGNOSIS — I2510 Atherosclerotic heart disease of native coronary artery without angina pectoris: Secondary | ICD-10-CM

## 2017-05-25 DIAGNOSIS — J101 Influenza due to other identified influenza virus with other respiratory manifestations: Secondary | ICD-10-CM

## 2017-05-25 DIAGNOSIS — I4891 Unspecified atrial fibrillation: Secondary | ICD-10-CM

## 2017-05-25 DIAGNOSIS — I480 Paroxysmal atrial fibrillation: Secondary | ICD-10-CM

## 2017-05-25 DIAGNOSIS — I10 Essential (primary) hypertension: Secondary | ICD-10-CM

## 2017-05-25 LAB — ECHOCARDIOGRAM COMPLETE
Ao-asc: 32 cm
CHL CUP MV DEC (S): 190
CHL CUP REG VEL DIAS: 109 cm/s
E/e' ratio: 15.04
EWDT: 190 ms
FS: 33 % (ref 28–44)
Height: 64 in
IVS/LV PW RATIO, ED: 1
LA diam end sys: 46 mm
LA vol A4C: 53.5 ml
LADIAMINDEX: 2.29 cm/m2
LASIZE: 46 mm
LAVOL: 61.4 mL
LAVOLIN: 30.5 mL/m2
LV E/e'average: 15.04
LV TDI E'LATERAL: 7.05
LV e' LATERAL: 7.05 cm/s
LVEEMED: 15.04
LVOT area: 4.15 cm2
LVOT diameter: 23 mm
MV Peak grad: 4 mmHg
MV pk A vel: 90.2 m/s
MVAP: 3.93 cm2
MVPKEVEL: 106 m/s
MVSPHT: 56 ms
PW: 10 mm — AB (ref 0.6–1.1)
RV sys press: 31 mmHg
Reg peak vel: 200 cm/s
TAPSE: 25.6 mm
TDI e' medial: 6.16
TRMAXVEL: 200 cm/s
Weight: 3058.22 oz

## 2017-05-25 LAB — BASIC METABOLIC PANEL
Anion gap: 17 — ABNORMAL HIGH (ref 5–15)
BUN: 8 mg/dL (ref 6–20)
CHLORIDE: 97 mmol/L — AB (ref 101–111)
CO2: 27 mmol/L (ref 22–32)
Calcium: 8.5 mg/dL — ABNORMAL LOW (ref 8.9–10.3)
Creatinine, Ser: 0.71 mg/dL (ref 0.44–1.00)
GFR calc Af Amer: >60 mL/min (ref >60)
GFR calc non Af Amer: >60 mL/min (ref >60)
GLUCOSE: 106 mg/dL — AB (ref 65–99)
POTASSIUM: 2.7 mmol/L — AB (ref 3.5–5.1)
Sodium: 141 mmol/L (ref 135–145)

## 2017-05-25 LAB — CULTURE, BLOOD (ROUTINE X 2)
CULTURE: NO GROWTH
Culture: NO GROWTH
Special Requests: ADEQUATE
Special Requests: ADEQUATE

## 2017-05-25 LAB — TSH: TSH: 0.302 u[IU]/mL — ABNORMAL LOW (ref 0.350–4.500)

## 2017-05-25 LAB — MAGNESIUM: Magnesium: 1.4 mg/dL — ABNORMAL LOW (ref 1.7–2.4)

## 2017-05-25 LAB — T4, FREE: Free T4: 1.07 ng/dL (ref 0.61–1.12)

## 2017-05-25 MED ORDER — MAGNESIUM SULFATE 2 GM/50ML IV SOLN
2.0000 g | Freq: Once | INTRAVENOUS | Status: AC
  Administered 2017-05-25: 2 g via INTRAVENOUS
  Filled 2017-05-25: qty 50

## 2017-05-25 MED ORDER — POTASSIUM CHLORIDE CRYS ER 20 MEQ PO TBCR
40.0000 meq | EXTENDED_RELEASE_TABLET | Freq: Once | ORAL | Status: AC
  Administered 2017-05-25: 40 meq via ORAL
  Filled 2017-05-25: qty 2

## 2017-05-25 MED ORDER — PREDNISONE 20 MG PO TABS
60.0000 mg | ORAL_TABLET | Freq: Every day | ORAL | Status: DC
  Administered 2017-05-25 – 2017-05-26 (×2): 60 mg via ORAL
  Filled 2017-05-25 (×2): qty 3

## 2017-05-25 MED ORDER — POTASSIUM CHLORIDE CRYS ER 20 MEQ PO TBCR
40.0000 meq | EXTENDED_RELEASE_TABLET | Freq: Three times a day (TID) | ORAL | Status: AC
  Administered 2017-05-25 – 2017-05-26 (×3): 40 meq via ORAL
  Filled 2017-05-25 (×3): qty 2

## 2017-05-25 MED ORDER — METOPROLOL TARTRATE 50 MG PO TABS
50.0000 mg | ORAL_TABLET | Freq: Two times a day (BID) | ORAL | Status: DC
  Administered 2017-05-25 – 2017-05-26 (×3): 50 mg via ORAL
  Filled 2017-05-25 (×3): qty 1

## 2017-05-25 NOTE — Progress Notes (Signed)
Patient suffers from paroxysmal atrial fibrillation, mild coronary artery disease, hypertension, giant cell arteritis on chronic prednisone, vocal cord dysfunction, hemorrhoids and diverticulosis.  Which impairs her ability to perform daily activities like walking and moving around the home , cane will not resolve this issue with ADL's   A transportation chair will allow patient to safely perform daily activities. Patient can safely propel the transport chair in the home and has a caregiver who can provide assistance

## 2017-05-25 NOTE — Plan of Care (Signed)
Patient ambulated to the bathroom and around the room with this RN supervising around 2100. She reports needing to have a BM and wanted some mouth care to be done. This RN assisted in helping her. She ambulated without difficulty. Will continue to monitor and assess.

## 2017-05-25 NOTE — Care Management Note (Addendum)
Case Management Note  Patient Details  Name: DARE SPILLMAN MRN: [id number] Date of Birth: 1934/12/31  Subjective/Objective:    From home with spouse, pta indep, she has a w/chair, a rollator at home.  She has PCP and medication coverage.  Patient states she would like to have a transportation chair , NCM informed MD and left narrative form on patients chart for MD to make a note in chart if he feels this is appropriate for patient.     2/27 [place], BSN- NCM spoke with patient and daughter regarding Home First Program with [place] thru [facility], patient is very interested and daughter is interested also.  [place] with [facility] will come back by to see patient and Tommi Rumps will also check paper work to make sure everything is ok.                            Action/Plan: DC home with [facility] services when medically ready.  Expected Discharge Date:  05/24/17               Expected Discharge Plan:  [place]  In-House Referral:     Discharge planning Services  CM Consult  Post Acute Care Choice:    Choice offered to:     DME Arranged:  Other see comment(transport chair) DME Agency:  [place]:  PT [place] Agency:  [place]  Status of Service:  Completed, signed off  If discussed at [place] of Stay Meetings, dates discussed:    Additional Comments:  Zenon Mayo, RN 05/25/2017, 2:31 PM

## 2017-05-25 NOTE — Consult Note (Signed)
   [facility] CM Inpatient Consult   05/25/2017  HAYLI MILLIGAN 1934/11/22 [id number]   [facility] Care Management referral received.   Spoke with Mrs. Councilman and her daughter at bedside to discuss and offer [place] Management services. Both patient and daughter thought [place] Management would not be necessary at this time. They pleasantly declined. Patient's daughter states she provides assistance to Mrs.Mclamb.   Also discussed that patient's Primary Care MD office is listed as doing their own transition of care calls.   Provided [facility] Care Management brochure and 24-hr nurse advice line magnet provided.   Made inpatient [name] aware Mrs. Hemberger pleasantly declined [place] Management services.    Marthenia Rolling, MSN-Ed, RN,BSN [facility] Liaison [phone]

## 2017-05-25 NOTE — Progress Notes (Signed)
CRITICAL VALUE ALERT  Critical Value:  K 2.7  Date & Time Notied:  05/25/17  Provider Notified: Nada Maclachlan  Orders Received/Actions taken: None at this time. K is being replaced.

## 2017-05-25 NOTE — Progress Notes (Signed)
PROGRESS NOTE    Betty Jackson  OFB:[id number] DOB: 1934/10/24 DOA: 05/20/2017 PCP: Eulas Post, MD  Outpatient Specialists:    Brief Narrative: Patient is an 83 year old female with past medical history significant for paroxysmal atrial fibrillation, mild coronary artery disease, hypertension, giant cell arteritis on chronic prednisone, vocal cord dysfunction, hemorrhoids and diverticulosis.  Patient is admitted with cough that is productive of greenish sputum, nausea and vomiting, with associated chills.  Patient found to be positive for influenza A/right middle lobe infiltrate, admitted for sepsis and pneumonia     Assessment & Plan:   Principal Problem:   Influenza A Active Problems:   Sepsis ([place])   Acute respiratory failure with hypoxia (HCC)   CKD (chronic kidney disease) stage 3, GFR 30-59 ml/min (HCC)   PAF (paroxysmal atrial fibrillation) (HCC)   HTN (hypertension)   Giant cell arteritis (HCC)   Coronary disease   Abnormal urinalysis Back pain.   Assessment/Plan    Sepsis w/Acute respiratory failure with hypoxia 2/2 Influenza A  Found to be positive for influenza PCR A  , no pneumonia on CT -Initially met Sepsis criteria  : Fever greater than 102 F, tachypnea, acute hypoxemia, serum lactate greater than 2.0, source of infection;   - continue  step down -Chest x-ray questions possible right middle lobe infiltrate  , CT of the chest completed on 2/21 showed  Atelectatic type opacities in the lingula in the left lower lobe. No consolidating pneumonia.  Initial pro-calcitonin 1.46 -Improved lactic acidosis during this hospitalization with volume resuscitation  Blood culture no growth so far, urine culture with insignificant growth - Initially started on treatment for community-acquired , vancomycin, Rocephin, azithromycin, all antibiotics discontinued 2/25 ,  -Currently needing 2-3 L of oxygen Patient initially started on stress dose steroids will DC Solu-Cortef  and switch to prednisone 60 mg a day as the patient is still wheezing       Abnormal urinalysis  Urine culture nonspecific with insignificant growth  Received Rocephin  2/21-2/25    Acute hypokalemia 2.7 Replete and recheck   Mild acute kidney injury: -This is most likely prerenal.  Improving. Resolved after IV hydration -Continue to monitor.   HTN (hypertension)  Initial hypotension resolved, blood pressure now stable   Resume metoprolol as the patient is having PVCs with A. fib  Rest of her home medications on hold  Remote history of PAF (paroxysmal atrial fibrillation)  Likely exacerbated by hypokalemia, check potassium and check magnesium resume metoprolol Continue to monitor on telemetry, obtain 2-D echo the last 2-D echo was done in 2014  History of giant cell arteritis  -On prednisone 10 mg daily. -Continue to monitor.  Coronary disease disease: -Documented as mild disease involving the LAD and circumflex based on most recent cardiac catheterization in 2014 -Previously had stent to RCA placed in [place], [place] in 2006 -resume  beta-blocker -We will attempt to continue preadmission Plavix but if this continues we will need to hold  Back pain, chronic: Start patient on opiate-based analgesics as needed.      DVT prophylaxis: Lovenox Code Status: DNR Family Communication: no  Family at bedside Disposition Plan: [place] in  2-3 days , PT/OT eval, very frail  Consultants:   None   Procedures:   None  Antimicrobials:  Anti-infectives (From admission, onward)   Start     Dose/Rate Route Frequency Ordered Stop   05/24/17 2200  azithromycin (ZITHROMAX) tablet 500 mg  Status:  Discontinued     500  mg Oral Daily at bedtime 05/24/17 0956 05/24/17 1642   05/21/17 1200  vancomycin (VANCOCIN) IVPB 1000 mg/200 mL premix  Status:  Discontinued     1,000 mg 200 mL/hr over 60 Minutes Intravenous Every 24 hours 05/20/17 1331 05/20/17 1538   05/21/17 0000   cefTRIAXone (ROCEPHIN) 1 g in sodium chloride 0.9 % 100 mL IVPB  Status:  Discontinued     1 g 200 mL/hr over 30 Minutes Intravenous Every 24 hours 05/20/17 1534 05/24/17 1642   05/21/17 0000  azithromycin (ZITHROMAX) 500 mg in sodium chloride 0.9 % 250 mL IVPB  Status:  Discontinued     500 mg 250 mL/hr over 60 Minutes Intravenous Every 24 hours 05/20/17 1534 05/24/17 0956   05/20/17 2200  oseltamivir (TAMIFLU) capsule 30 mg     30 mg Oral 2 times daily 05/20/17 1549 05/25/17 0918   05/20/17 1900  piperacillin-tazobactam (ZOSYN) IVPB 3.375 g  Status:  Discontinued     3.375 g 12.5 mL/hr over 240 Minutes Intravenous Every 8 hours 05/20/17 1158 05/20/17 1538   05/20/17 1445  oseltamivir (TAMIFLU) capsule 75 mg  Status:  Discontinued     75 mg Oral  Once 05/20/17 1444 05/20/17 1527   05/20/17 1115  piperacillin-tazobactam (ZOSYN) IVPB 3.375 g     3.375 g 100 mL/hr over 30 Minutes Intravenous  Once 05/20/17 1106 05/20/17 1210   05/20/17 1115  vancomycin (VANCOCIN) IVPB 1000 mg/200 mL premix     1,000 mg 200 mL/hr over 60 Minutes Intravenous  Once 05/20/17 1106 05/20/17 1332            Subjective:  patient remains in atrial fibrillation with PVCs,shortness of breath this morning Oxygen requirements have increased this morning, patient unable to speak in full sentences, wheezing  Objective: Vitals:   05/25/17 0340 05/25/17 0754 05/25/17 0855 05/25/17 1148  BP: 135/79 (!) 138/102  108/87  Pulse: 67 (!) 140  (!) 54  Resp:  (!) 26  (!) 22  Temp: 97.6 F (36.4 C) 97.7 F (36.5 C)  97.6 F (36.4 C)  TempSrc: Oral Oral  Oral  SpO2: 95% 95% 95% (!) 87%  Weight:      Height:        Intake/Output Summary (Last 24 hours) at 05/25/2017 1330 Last data filed at 05/25/2017 1143 Gross per 24 hour  Intake 240 ml  Output 2900 ml  Net -2660 ml   Filed Weights   05/22/17 0432 05/23/17 0500 05/24/17 0355  Weight: 83.3 kg (183 lb 10.3 oz) 84 kg (185 lb 3 oz) 86.7 kg (191 lb 2.2 oz)     Examination:  General exam: Patient looks better today.  Not in any distress.  Awake and alert.     Respiratory system: Expiratory wheeze, mild to was an expiratory wheeze.  Cardiovascular system: S1 & S2.  Minimal pedal edema. Gastrointestinal system: Abdomen is nondistended, soft and nontender. No organomegaly or masses felt. Normal bowel sounds heard. Central nervous system: Alert and oriented. No focal neurological deficits. Extremities: Minimal leg edema.    Data Reviewed: I have personally reviewed following labs and imaging studies  CBC: Recent Labs  Lab 05/20/17 1108 05/21/17 0340  WBC 10.8* 10.2  NEUTROABS 8.2*  --   HGB 12.2 11.6*  HCT 37.0 36.5  MCV 93.0 94.6  PLT 170 443   Basic Metabolic Panel: Recent Labs  Lab 05/20/17 1108 05/21/17 0340 05/24/17 0232 05/25/17 0902  NA 135 138 137 141  K 3.2* 3.7 3.2*  2.7*  CL 96* 107 104 97*  CO2 26 18* 20* 27  GLUCOSE 109* 109* 123* 106*  BUN _0 CREATININE 1.09* 1.04* 0.68 0.71  CALCIUM 8.7* 7.3* 8.2* 8.5*  MG  --   --   --  1.4*  PHOS  --   --  2.5  --    GFR: Estimated Creatinine Clearance: 59.8 mL/min (by C-G formula based on SCr of 0.71 mg/dL). Liver Function Tests: Recent Labs  Lab 05/20/17 1108 05/21/17 0340 05/24/17 0232  AST 32 66*  --   ALT 22 25  --   ALKPHOS 63 52  --   BILITOT 0.8 0.8  --   PROT 6.2* 5.4*  --   ALBUMIN 3.7 3.0* 3.0*   Recent Labs  Lab 05/20/17 1108  LIPASE 38   No results for input(s): AMMONIA in the last 168 hours. Coagulation Profile: No results for input(s): INR, PROTIME in the last 168 hours. Cardiac Enzymes: No results for input(s): CKTOTAL, CKMB, CKMBINDEX, TROPONINI in the last 168 hours. BNP (last 3 results) No results for input(s): PROBNP in the last 8760 hours. HbA1C: No results for input(s): HGBA1C in the last 72 hours. CBG: Recent Labs  Lab 05/21/17 1228  GLUCAP 142*   Lipid Profile: No results for input(s): CHOL, HDL, LDLCALC, TRIG,  CHOLHDL, LDLDIRECT in the last 72 hours. Thyroid Function Tests: Recent Labs    05/25/17 0938  TSH 0.302*   Anemia Panel: No results for input(s): VITAMINB12, FOLATE, FERRITIN, TIBC, IRON, RETICCTPCT in the last 72 hours. Urine analysis:    Component Value Date/Time   COLORURINE YELLOW 05/20/2017 1444   APPEARANCEUR CLOUDY (A) 05/20/2017 1444   LABSPEC 1.041 (H) 05/20/2017 1444   PHURINE 5.0 05/20/2017 1444   GLUCOSEU NEGATIVE 05/20/2017 1444   GLUCOSEU NEGATIVE 07/31/2016 1018   HGBUR MODERATE (A) 05/20/2017 1444   BILIRUBINUR NEGATIVE 05/20/2017 1444   BILIRUBINUR neg 07/31/2016 1012   KETONESUR NEGATIVE 05/20/2017 1444   PROTEINUR 30 (A) 05/20/2017 1444   UROBILINOGEN 0.2 07/31/2016 1018   NITRITE POSITIVE (A) 05/20/2017 1444   LEUKOCYTESUR LARGE (A) 05/20/2017 1444   Sepsis Labs: _1 (procalcitonin:4,lacticidven:4)  ) Recent Results (from the past 240 hour(s))  Blood Culture (routine x 2)     Status: None   Collection Time: 05/20/17 11:07 AM  Result Value Ref Range Status   Specimen Description BLOOD LEFT FOREARM  Final   Special Requests   Final    Blood Culture adequate volume BOTTLES DRAWN AEROBIC AND ANAEROBIC   Culture   Final    NO GROWTH 5 DAYS Performed at [facility], [place] [address] [place], [place] [address]    Report Status 05/25/2017 FINAL  Final  Blood Culture (routine x 2)     Status: None   Collection Time: 05/20/17 11:08 AM  Result Value Ref Range Status   Specimen Description BLOOD LEFT FOREARM  Final   Special Requests   Final    Blood Culture adequate volume BOTTLES DRAWN AEROBIC ONLY   Culture   Final    NO GROWTH 5 DAYS Performed at [facility], [place] [address], [place], [place] [address]    Report Status 05/25/2017 FINAL  Final  Urine culture     Status: Abnormal   Collection Time: 05/20/17  2:44 PM  Result Value Ref Range Status   Specimen Description URINE, CATHETERIZED  Final   Special Requests NONE  Final    Culture (A)  Final    <10,000 COLONIES/mL INSIGNIFICANT GROWTH Performed at [place] [address] [place], [place] [address]    Report Status 05/21/2017 FINAL  Final  MRSA PCR Screening     Status: None   Collection Time: 05/20/17  7:03 PM  Result Value Ref Range Status   MRSA by PCR NEGATIVE NEGATIVE Final    Comment:        The GeneXpert MRSA Assay (FDA approved for NASAL specimens only), is one component of a comprehensive MRSA colonization surveillance program. It is not intended to diagnose MRSA infection nor to guide or monitor treatment for MRSA infections. Performed at [facility], [place] [address] [place], [place] [address]          Radiology Studies: No results found.      Scheduled Meds: . budesonide (PULMICORT) nebulizer solution  0.25 mg Nebulization BID  . clopidogrel  75 mg Oral Daily  . enoxaparin (LOVENOX) injection  40 mg Subcutaneous Q24H  . fluticasone  1 spray Each Nare Daily  . ipratropium-albuterol  3 mL Nebulization Q6H  . metoprolol tartrate  50 mg Oral BID  . potassium chloride  40 mEq Oral TID  . predniSONE  60 mg Oral Q breakfast  . sodium chloride flush  3 mL Intravenous Q12H  . torsemide  20 mg Oral Daily   Continuous Infusions: . magnesium sulfate 1 - 4 g bolus IVPB       LOS: 5 days    Time spent: 25 minutes.      [facility] Pager #: [phone] 7PM-7AM contact night coverage as above

## 2017-05-25 NOTE — Progress Notes (Signed)
  Echocardiogram 2D Echocardiogram has been performed.  Betty Jackson 05/25/2017, 3:44 PM

## 2017-05-26 ENCOUNTER — Encounter (HOSPITAL_COMMUNITY): Payer: Self-pay | Admitting: Physician Assistant

## 2017-05-26 ENCOUNTER — Other Ambulatory Visit: Payer: Self-pay | Admitting: Physician Assistant

## 2017-05-26 ENCOUNTER — Other Ambulatory Visit: Payer: Self-pay | Admitting: Family Medicine

## 2017-05-26 ENCOUNTER — Telehealth: Payer: Self-pay | Admitting: Cardiovascular Disease

## 2017-05-26 DIAGNOSIS — M316 Other giant cell arteritis: Secondary | ICD-10-CM

## 2017-05-26 DIAGNOSIS — I480 Paroxysmal atrial fibrillation: Secondary | ICD-10-CM

## 2017-05-26 DIAGNOSIS — A419 Sepsis, unspecified organism: Principal | ICD-10-CM

## 2017-05-26 LAB — COMPREHENSIVE METABOLIC PANEL
ALK PHOS: 43 U/L (ref 38–126)
ALT: 29 U/L (ref 14–54)
AST: 31 U/L (ref 15–41)
Albumin: 3 g/dL — ABNORMAL LOW (ref 3.5–5.0)
Anion gap: 11 (ref 5–15)
BUN: 16 mg/dL (ref 6–20)
CALCIUM: 8.3 mg/dL — AB (ref 8.9–10.3)
CHLORIDE: 95 mmol/L — AB (ref 101–111)
CO2: 33 mmol/L — AB (ref 22–32)
Creatinine, Ser: 0.8 mg/dL (ref 0.44–1.00)
GFR calc Af Amer: >60 mL/min (ref >60)
GFR calc non Af Amer: >60 mL/min (ref >60)
GLUCOSE: 115 mg/dL — AB (ref 65–99)
Potassium: 4 mmol/L (ref 3.5–5.1)
SODIUM: 139 mmol/L (ref 135–145)
Total Bilirubin: 0.6 mg/dL (ref 0.3–1.2)
Total Protein: 5.5 g/dL — ABNORMAL LOW (ref 6.5–8.1)

## 2017-05-26 LAB — MAGNESIUM: Magnesium: 2.2 mg/dL (ref 1.7–2.4)

## 2017-05-26 MED ORDER — LOSARTAN POTASSIUM 50 MG PO TABS
100.0000 mg | ORAL_TABLET | Freq: Every day | ORAL | Status: DC
  Administered 2017-05-26: 100 mg via ORAL
  Filled 2017-05-26: qty 2

## 2017-05-26 MED ORDER — POLYVINYL ALCOHOL 1.4 % OP SOLN
1.0000 [drp] | Freq: Three times a day (TID) | OPHTHALMIC | Status: DC | PRN
  Filled 2017-05-26: qty 15

## 2017-05-26 MED ORDER — HYPROMELLOSE (GONIOSCOPIC) 2.5 % OP SOLN: 1.0000 [drp] | Freq: Three times a day (TID) | OPHTHALMIC | Status: DC | PRN

## 2017-05-26 MED ORDER — METOPROLOL TARTRATE 50 MG PO TABS: 75.0000 mg | ORAL_TABLET | Freq: Two times a day (BID) | ORAL | Status: DC

## 2017-05-26 MED ORDER — METOPROLOL TARTRATE 50 MG PO TABS
50.0000 mg | ORAL_TABLET | Freq: Once | ORAL | Status: AC
  Administered 2017-05-26: 50 mg via ORAL
  Filled 2017-05-26: qty 1

## 2017-05-26 MED ORDER — PREDNISONE 20 MG PO TABS: 50.0000 mg | ORAL_TABLET | Freq: Every day | ORAL | Status: DC

## 2017-05-26 MED ORDER — MOMETASONE FURO-FORMOTEROL FUM 200-5 MCG/ACT IN AERO: 2.0000 | INHALATION_SPRAY | Freq: Two times a day (BID) | RESPIRATORY_TRACT | 1 refills | Status: DC

## 2017-05-26 MED ORDER — PREDNISONE 10 MG PO TABS: ORAL_TABLET | ORAL | 1 refills | Status: DC

## 2017-05-26 MED ORDER — MOMETASONE FURO-FORMOTEROL FUM 200-5 MCG/ACT IN AERO
2.0000 | INHALATION_SPRAY | Freq: Two times a day (BID) | RESPIRATORY_TRACT | Status: DC
  Filled 2017-05-26: qty 8.8

## 2017-05-26 MED ORDER — FUROSEMIDE 20 MG PO TABS: 20.0000 mg | ORAL_TABLET | Freq: Every day | ORAL | 1 refills | Status: DC

## 2017-05-26 MED ORDER — HYDRALAZINE HCL 20 MG/ML IJ SOLN
10.0000 mg | Freq: Once | INTRAMUSCULAR | Status: AC
  Administered 2017-05-26: 10 mg via INTRAVENOUS
  Filled 2017-05-26: qty 1

## 2017-05-26 MED ORDER — POTASSIUM CHLORIDE 20 MEQ PO PACK: 20.0000 meq | PACK | Freq: Two times a day (BID) | ORAL | 1 refills | Status: DC

## 2017-05-26 MED ORDER — METHOCARBAMOL 500 MG PO TABS: 500.0000 mg | ORAL_TABLET | Freq: Three times a day (TID) | ORAL | 0 refills | Status: DC | PRN

## 2017-05-26 MED ORDER — MAGNESIUM OXIDE 400 MG PO TABS: 400.0000 mg | ORAL_TABLET | Freq: Two times a day (BID) | ORAL | 2 refills | Status: DC

## 2017-05-26 MED ORDER — METOPROLOL TARTRATE 75 MG PO TABS: 75.0000 mg | ORAL_TABLET | Freq: Two times a day (BID) | ORAL | 1 refills | Status: DC

## 2017-05-26 MED ORDER — PREDNISONE 20 MG PO TABS: 40.0000 mg | ORAL_TABLET | Freq: Every day | ORAL | Status: DC

## 2017-05-26 NOTE — Consult Note (Signed)
Cardiology Consultation:   Patient ID: Betty Jackson; [id number]; [birth year]   Admit date: 05/20/2017 Date of Consult: 05/26/2017  Primary Care Provider: Eulas Post, MD Primary Cardiologist: Dr Angelena Form, 12/14/2016 Primary Electrophysiologist:  n/a   Patient Profile:   Betty Jackson is a 83 y.o. female with a hx of stent RCA 2005 (in [place]), HTN, HLD, GERD, temporal arteritis, CVA, PAF dx 2011, nl EF echo 2014, nl MV 2016, who is being seen today for the evaluation of PAF at the request of Dr Allyson Sabal.  History of Present Illness:   Betty Jackson was admitted 02/21-02/27 with Flu A, sepsis, resp failure w/ hypoxia, PNA.  She completed ABX prior to d/c. Stress-dose steroids>>slow taper back to home dose 10 mg qd. Hypokalemia to 2.7 after Lasix 40 mg IV>>repleted. Mg 1.4 s/p supplment>>2.2 by d/c.    [name] had an episode of atrial fibrillation and cardiology was asked to evaluate her.  Patient states that  She is concerned about her cardiac status because she had some atrial fibrillation and her blood pressure was very high last night.  Upon review of telemetry, she has had frequent PVCs and PACs.  She had more of them when her  potassium was low, but is still having them now with normal electrolytes.  On February 25th,  she had a brief episode of atrial fib that lasted less than 30 seconds.  That is the only episode of atrial fib that I see. It is unclear if she had any symptoms attributable to this.  She does not believe she has had any atrial fib prior to admission.  She does not remember having palpitations prior to admission.  However, she is very ill from a respiratory standpoint and might not have noticed them.  She still feels weak and has significant dyspnea on exertion.  She is also aware that her HCTZ was held on admission because of possible sepsis and hypotension as well as renal insufficiency.  However, she became volume overloaded and required a dose of IV Lasix.  She hopes to  get her volume status back to baseline soon.  She has not had chest pain consistent with angina.  Her breathing is significantly improved from admission.  Of note, her husband is also in the hospital now. He is a few doors down.  Their daughter is here, she is going back and forth between the 2 rooms.  According to the patient, her husband is not doing well.   Past Medical History:  Diagnosis Date  . Anxiety state, unspecified 10/28/2012  . Bruises easily    d/t being on prednisone and plavix  . CAD (coronary artery disease)    a. Stent RCA in [facility];  b. Cath approx 2009 - nonobs per pt report.  . Cataract    immature on the left eye  . Chronic lower back pain    scoliosis  . CKD (chronic kidney disease) stage 3, GFR 30-59 ml/min (HCC)   . Complication of anesthesia    pt has a very high tolerance to meds  . DDD (degenerative disc disease)   . Depression   . Diverticulitis    hx of  . Diverticulosis   . Enteritis   . Giant cell arteritis ([place])   . Hemorrhoids   . History of scabies   . Hyperlipidemia    takes Lipitor daily  . Hypertension    takes Amlodipine,Losartan,Metoprolol,and HCTZ daily  . Incontinence of urine   . Insomnia  takes Restoril nightly  . Joint pain   . Migraines    "back in my 20's; none since" (10/28/2012)  . Osteopenia   . Osteoporosis   . PAF (paroxysmal atrial fibrillation) ([place])    a. lone epidode in 2011 according to notes.  . Rheumatoid arthritis ([place])   . Scoliosis   . Sinus bradycardia    a. on chronic bb  . Temporal arteritis ([place]) 2011   a. followed @ [facility]; potential flareup 10/28/2012/notes 10/28/2012  . Urinary frequency   . Vocal cord dysfunction    "they don't operate properly" (10/28/2012)    Past Surgical History:  Procedure Laterality Date  . ABDOMINAL HYSTERECTOMY  ~ 1984   vaginally  . BACK SURGERY  7-73yrs ago   X Stop  . BLADDER SUSPENSION  2001  . BREAST BIOPSY Right   . CATARACT EXTRACTION W/ INTRAOCULAR LENS  IMPLANT Right ~ 08/2012  . COLONOSCOPY  01/26/2012   Procedure: COLONOSCOPY;  Surgeon: Ladene Artist, MD,FACG;  Location: [facility] ENDOSCOPY;  Service: Endoscopy;  Laterality: N/A;  note the EGD is possible  . CORONARY ANGIOPLASTY WITH STENT PLACEMENT  2006   X 1 stent  . EPIDURAL BLOCK INJECTION    . ESOPHAGOGASTRODUODENOSCOPY  01/26/2012   Procedure: ESOPHAGOGASTRODUODENOSCOPY (EGD);  Surgeon: Ladene Artist, MD,FACG;  Location: [facility] ENDOSCOPY;  Service: Endoscopy;  Laterality: N/A;  . HEMIARTHROPLASTY HIP Right 2012  . LAPAROSCOPIC CHOLECYSTECTOMY  2001  . LUMBAR FUSION  03/2013  . LUMBAR LAMINECTOMY/DECOMPRESSION MICRODISCECTOMY Right 11/04/2015   Procedure: Right Lumbar Five-Sacral One Microdiskectomy;  Surgeon: Kristeen Miss, MD;  Location: [facility] NEURO ORS;  Service: Neurosurgery;  Laterality: Right;  Right L5-S1 Microdiskectomy  . moles removed that required stiches     one on leg and one on face  . TEMPORAL ARTERY BIOPSY / LIGATION Bilateral 2011  . TONSILLECTOMY AND ADENOIDECTOMY     at age 60  . TUBAL LIGATION  ~ 1982  . X-STOP IMPLANTATION  ~ 2010   "lower back" (10/28/2012)     Prior to Admission medications   Medication Sig  amLODipine (NORVASC) 5 MG tablet TAKE 1 TABLET (5 MG TOTAL) BY MOUTH DAILY.  calcium carbonate (OS-CAL) 600 MG TABS Take 600 mg by mouth daily.  cholecalciferol (VITAMIN D) 1000 UNITS tablet Take 1,000 Units by mouth daily.   clopidogrel (PLAVIX) 75 MG tablet TAKE 1 TABLET BY MOUTH EVERY DAY WITH BREAKFAST  escitalopram (LEXAPRO) 10 MG tablet TAKE 1 TABLET BY MOUTH EVERY DAY  fluticasone (FLONASE) 50 MCG/ACT nasal spray Place 1 spray into both nostrils daily as needed.  hydrochlorothiazide (MICROZIDE) 12.5 MG capsule Take 1 capsule (12.5 mg total) by mouth daily.  losartan (COZAAR) 100 MG tablet TAKE 1 TABLET BY MOUTH EVERY DAY  metoprolol (LOPRESSOR) 50 MG tablet TAKE 1 TABLET BY MOUTH TWICE A DAY Patient taking differently: Taking 1 tab daily  ondansetron  (ZOFRAN ODT) 4 MG disintegrating tablet Take 1 tablet (4 mg total) by mouth every 8 (eight) hours as needed for nausea or vomiting.  potassium chloride (KLOR-CON 10) 10 MEQ tablet TAKE 1 TABLET (10 MEQ TOTAL) BY MOUTH DAILY.  temazepam (RESTORIL) 15 MG capsule Take 1 capsule (15 mg total) by mouth at bedtime as needed for sleep.  vitamin C (ASCORBIC ACID) 500 MG tablet Take 1,000 mg by mouth daily.   vitamin E (VITAMIN E) 400 UNIT capsule Take 400 Units by mouth daily.  furosemide (LASIX) 20 MG tablet Take 1 tablet (20 mg total) by mouth  daily.  HYDROcodone-homatropine (HYCODAN) 5-1.5 MG/5ML syrup Take 1 tsp every 6 hrs as needed for cough Patient not taking: Reported on 05/20/2017  magnesium oxide (MAG-OX) 400 MG tablet Take 1 tablet (400 mg total) by mouth 2 (two) times daily.  mometasone-formoterol (DULERA) 200-5 MCG/ACT AERO Inhale 2 puffs into the lungs 2 (two) times daily.  potassium chloride (KLOR-CON) 20 MEQ packet Take 20 mEq by mouth 2 (two) times daily.  predniSONE (DELTASONE) 10 MG tablet 4 tablets for 3 days, 3 tablets for 3 days, 2 tablets for 3 days, 1 tablet continue    Inpatient Medications: Scheduled Meds: . budesonide (PULMICORT) nebulizer solution  0.25 mg Nebulization BID  . clopidogrel  75 mg Oral Daily  . enoxaparin (LOVENOX) injection  40 mg Subcutaneous Q24H  . fluticasone  1 spray Each Nare Daily  . ipratropium-albuterol  3 mL Nebulization Q6H  . losartan  100 mg Oral Daily  . metoprolol tartrate  50 mg Oral BID  . mometasone-formoterol  2 puff Inhalation BID  . [START ON 05/27/2017] predniSONE  40 mg Oral Q breakfast  . sodium chloride flush  3 mL Intravenous Q12H   Continuous Infusions:  PRN Meds: ipratropium-albuterol, ondansetron (ZOFRAN) IV **OR** promethazine, oxyCODONE-acetaminophen, polyvinyl alcohol, temazepam  Allergies:    Allergies  Allergen Reactions  . Dextromethorphan Rash    Social History:   Social History   Socioeconomic History  .  Marital status: Married    Spouse name: Not on file  . Number of children: 3  . Years of education: Not on file  . Highest education level: Not on file  Social Needs  . Financial resource strain: Not on file  . Food insecurity - worry: Not on file  . Food insecurity - inability: Not on file  . Transportation needs - medical: Not on file  . Transportation needs - non-medical: Not on file  Occupational History  . Occupation: Retired Cabin crew  Tobacco Use  . Smoking status: Never Smoker  . Smokeless tobacco: Never Used  Substance and Sexual Activity  . Alcohol use: Yes    Alcohol/week: 0.0 oz    Comment: socially  . Drug use: No  . Sexual activity: Not Currently    Birth control/protection: Surgical  Other Topics Concern  . Not on file  Social History Narrative   Lives in [place] with her husband.  Retired Cabin crew.    Family History:   Family History  Problem Relation Age of Onset  . Brain cancer Mother 45  . Heart attack Father   . Breast cancer Daughter 61  . Heart disease Other   . Hypertension Other   . Arthritis Neg Hx   . Colon cancer Neg Hx   . Osteoporosis Neg Hx    Family Status:  Family Status  Relation Name Status  . Mother  Deceased       brain tumor  . Father  Deceased       MI  . Daughter  (Not Specified)  . Other  (Not Specified)  . Neg Hx  (Not Specified)    ROS:  Please see the history of present illness.  All other ROS reviewed and negative.     Physical Exam/Data:   Vitals:   05/26/17 0754 05/26/17 0759 05/26/17 1233 05/26/17 1249  BP: (!) 181/76   (!) 183/116  Pulse: 78   76  Resp: (!) 21   (!) 22  Temp: 98.6 F (37 C)   98.6 F (37 C)  TempSrc:  Oral  Oral Oral  SpO2: 100% 98%  94%  Weight:      Height:        Intake/Output Summary (Last 24 hours) at 05/26/2017 1347 Last data filed at 05/26/2017 8119 Gross per 24 hour  Intake 240 ml  Output 825 ml  Net -585 ml   Filed Weights   05/23/17 0500 05/24/17 0355 05/26/17 0500    Weight: 185 lb 3 oz (84 kg) 191 lb 2.2 oz (86.7 kg) 176 lb 9.4 oz (80.1 kg)   Body mass index is 30.31 kg/m.  General:  Well nourished, well developed, in no acute distress HEENT: normal Lymph: no adenopathy Neck: JVD 9 cm Endocrine:  No thryomegaly Vascular: No carotid bruits; 4/4 extremity pulses 2+, without bruits  Cardiac:  normal S1, S2; RRR; no murmur  Lungs: Decreased breath sounds bases with Rales bilaterally, no wheezing, rhonchi Abd: soft, nontender, no hepatomegaly  Ext: no edema Musculoskeletal:  No deformities, BUE and BLE strength normal and equal Skin: warm and dryNeuro:  CNs 2-12 intact, no focal abnormalities noted Psych:  Normal affect   EKG:  The EKG was personally reviewed and demonstrates:  0221 sinus rhythm, PVC noted, no acute ischemic changes, HR 85  Telemetry:  Telemetry was personally reviewed and demonstrates: Sinus rhythm with PACs and PVCs.  Frequent, but no episodes of trigeminy in the last 24 hours  Relevant CV Studies:  ECHO: 05/25/2017 - Left ventricle: The cavity size was normal. Systolic function was   normal. The estimated ejection fraction was in the range of 60%   to 65%. Wall motion was normal; there were no regional wall   motion abnormalities. Features are consistent with a pseudonormal   left ventricular filling pattern, with concomitant abnormal   relaxation and increased filling pressure (grade 2 diastolic   dysfunction). - Aortic valve: There was mild regurgitation. - Mitral valve: Calcified annulus. - Left atrium: The atrium was moderately dilated. - Pulmonary arteries: PA peak pressure: 31 mm Hg (S). - Pericardium, extracardiac: There was a left pleural effusion.  R/L CATH: 12/09/2012 Hemodynamic Findings: Ao:  150/61            LV: 155/13/18 RA:   6             RV:  25/5/8 PA:  24/10 (mean 16)       PCWP:  9 Fick Cardiac Output: 3.9 L/min Fick Cardiac Index: 2.3 L/min/m2 Central Aortic Saturation: 98% Pulmonary Artery  Saturation: 69%  Angiographic Findings: Left main: 10% distal stenosis.  Left Anterior Descending Artery: Large caliber vessel that courses to the apex. The proximal and mid vessel has moderate calcification. The mid vessel has a long 20% stenosis. There are two small caliber diagonal branches with mild plaque disease.  Circumflex Artery: Large caliber vessel with two small caliber obtuse marginal branches. No obstructive disease.  Right Coronary Artery: Large caliber, dominant vessel with a patent stent in the mid vessel. There is minimal stent restenosis. Just beyond the stent there is a 20% stenosis.  Left Ventricular Angiogram: LVEF=65% Impression: 1. Stable single vessel CAD with patent mid RCA stent 2. Normal LV systolic function 3. Normal PA pressures Recommendations: Continue current medical management.   Laboratory Data:  Chemistry Recent Labs  Lab 05/24/17 0232 05/25/17 0902 05/26/17 0232  NA 137 141 139  K 3.2* 2.7* 4.0  CL 104 97* 95*  CO2 20* 27 33*  GLUCOSE 123* 106* 115*  BUN 8 8 16   CREATININE 0.68  0.71 0.80  CALCIUM 8.2* 8.5* 8.3*  GFRNONAA >60 >60 >60  GFRAA >60 >60 >60  ANIONGAP 13 17* 11    Lab Results  Component Value Date   ALT 29 05/26/2017   AST 31 05/26/2017   ALKPHOS 43 05/26/2017   BILITOT 0.6 05/26/2017   Hematology Recent Labs  Lab 05/20/17 1108 05/21/17 0340  WBC 10.8* 10.2  RBC 3.98 3.86*  HGB 12.2 11.6*  HCT 37.0 36.5  MCV 93.0 94.6  MCH 30.7 30.1  MCHC 33.0 31.8  RDW 13.8 14.5  PLT 170 153   TSH:  Lab Results  Component Value Date   TSH 0.302 (L) 05/25/2017   Free T4  Date Value Ref Range Status  05/25/2017 1.07 0.61 - 1.12 ng/dL Final    Comment:    (NOTE) Biotin ingestion may interfere with free T4 tests. If the results are inconsistent with the TSH level, previous test results, or the clinical presentation, then consider biotin interference. If needed, order repeat testing after stopping  biotin. Performed at [facility], [place] [address], [place], [place] [address]    HgbA1c: Lab Results  Component Value Date   HGBA1C 5.8 03/31/2017   Magnesium:  Magnesium  Date Value Ref Range Status  05/26/2017 2.2 1.7 - 2.4 mg/dL Final    Comment:    Performed at [facility], [place] [address], [place],  [address]    Radiology/Studies:  No results found.  Assessment and Plan:   Principal Problem: 1.  Influenza A -Patient still has a great deal of recovery to do, but is improved and ready for discharge per IM  2.  PAF (paroxysmal atrial fibrillation) ([place]) -Patient had an episode previously in 2011 when she was being treated for temporal arteritis with high-dose steroids. - Patient does not believe she has had an episode until this admission, also on high-dose steroids. -She is on metoprolol 50 mg twice daily, started 2/26, this is her home dose, but it had been held since admission. -Her blood pressure is elevated, I will give her metoprolol 50 mg now and can increase her dose to 75 or 100 mg twice daily.  Active Problems: 3.  Sepsis ([place])  -Now off antibiotics, per IM  4.  Acute respiratory failure with hypoxia (HCC) -Improved, O2 saturation is currently 98% on 2 L.  5.  CKD (chronic kidney disease) stage 3, GFR 30-59 ml/min (HCC) -She was on HCTZ 12.5 mg a day on admission.  Her creatinine was 1.04 on admission.  It was 1.09 on 2/21.  - She was hydrated and her renal function is improved.  6.  HTN (hypertension) -Blood pressure has been elevated for the last 24 hours or so, metoprolol has been restarted, increase this -She was also on amlodipine 5 mg daily, restart this -Losartan 100 mg daily had been held since admission, was restarted today  7.  Giant cell arteritis ([place]) -Reason for the chronic steroids, no symptoms right now  8.  Coronary artery disease - continue Plavix - on ARB and BB - was on Lipitor 20 mg qd in 2016, not since - Dr  Angelena Form can address this as outpt.  9.  Abnormal urinalysis  per IM   For questions or updates, please contact [place] Please consult [email or web address] for contact info under Cardiology/STEMI.   SignedRosaria Ferries, PA-C  05/26/2017 1:47 PM

## 2017-05-26 NOTE — Discharge Summary (Addendum)
Physician Discharge Summary  Betty Jackson MRN: [id number] DOB/AGE: 1934/09/20 83 y.o.  PCP: Eulas Post, MD   Admit date: 05/20/2017 Discharge date: 05/26/2017  Discharge Diagnoses:    Principal Problem:   Influenza A Active Problems:   Sepsis ([place])   Acute respiratory failure with hypoxia (HCC)   CKD (chronic kidney disease) stage 3, GFR 30-59 ml/min (HCC)   PAF (paroxysmal atrial fibrillation) (HCC)   HTN (hypertension)   Giant cell arteritis (HCC)   Coronary disease   Abnormal urinalysis    Follow-up recommendations Follow-up with PCP in 3-5 days , including all  additional recommended appointments as below Follow-up CBC, CMP, magnesium in 3-5 days Patient is being discharged with home health recommend a 30 day outpatient monitor , cardiology notified     Allergies as of 05/26/2017      Reactions   Dextromethorphan Rash      Medication List    STOP taking these medications   fluticasone 50 MCG/ACT nasal spray Commonly known as:  FLONASE   hydrochlorothiazide 12.5 MG capsule Commonly known as:  MICROZIDE   potassium chloride 10 MEQ tablet Commonly known as:  KLOR-CON 10 Replaced by:  potassium chloride 20 MEQ packet     TAKE these medications   amLODipine 5 MG tablet Commonly known as:  NORVASC TAKE 1 TABLET (5 MG TOTAL) BY MOUTH DAILY.   calcium carbonate 600 MG Tabs tablet Commonly known as:  OS-CAL Take 600 mg by mouth daily.   cholecalciferol 1000 units tablet Commonly known as:  VITAMIN D Take 1,000 Units by mouth daily.   clopidogrel 75 MG tablet Commonly known as:  PLAVIX TAKE 1 TABLET BY MOUTH EVERY DAY WITH BREAKFAST   escitalopram 10 MG tablet Commonly known as:  LEXAPRO TAKE 1 TABLET BY MOUTH EVERY DAY   furosemide 20 MG tablet Commonly known as:  LASIX Take 1 tablet (20 mg total) by mouth daily. What changed:  See the new instructions.   HYDROcodone-homatropine 5-1.5 MG/5ML syrup Commonly known as:  HYCODAN Take 1 tsp  every 6 hrs as needed for cough   losartan 100 MG tablet Commonly known as:  COZAAR TAKE 1 TABLET BY MOUTH EVERY DAY   magnesium oxide 400 MG tablet Commonly known as:  MAG-OX Take 1 tablet (400 mg total) by mouth 2 (two) times daily.   methocarbamol 500 MG tablet Commonly known as:  ROBAXIN Take 1 tablet (500 mg total) by mouth every 8 (eight) hours as needed for muscle spasms.   Metoprolol Tartrate 75 MG Tabs Take 75 mg by mouth 2 (two) times daily. What changed:    medication strength  how much to take   mometasone-formoterol 200-5 MCG/ACT Aero Commonly known as:  DULERA Inhale 2 puffs into the lungs 2 (two) times daily.   ondansetron 4 MG disintegrating tablet Commonly known as:  ZOFRAN ODT Take 1 tablet (4 mg total) by mouth every 8 (eight) hours as needed for nausea or vomiting.   potassium chloride 20 MEQ packet Commonly known as:  KLOR-CON Take 20 mEq by mouth 2 (two) times daily. Replaces:  potassium chloride 10 MEQ tablet   predniSONE 10 MG tablet Commonly known as:  DELTASONE 4 tablets for 3 days, 3 tablets for 3 days, 2 tablets for 3 days, 1 tablet continue What changed:    how much to take  how to take this  additional instructions   temazepam 15 MG capsule Commonly known as:  RESTORIL Take 1 capsule (15 mg  total) by mouth at bedtime as needed for sleep.   vitamin C 500 MG tablet Commonly known as:  ASCORBIC ACID Take 1,000 mg by mouth daily.   vitamin E 400 UNIT capsule Generic drug:  vitamin E Take 400 Units by mouth daily.            Durable Medical Equipment  (From admission, onward)        Start     Ordered   05/25/17 1429  For home use only DME Other see comment  Once    Comments:  Transport chair   05/25/17 1428        Discharge Condition:  Stable  Discharge Instructions Get Medicines reviewed and adjusted: Please take all your medications with you for your next visit with your Primary MD  Please request your  Primary MD to go over all hospital tests and procedure/radiological results at the follow up, please ask your Primary MD to get all Hospital records sent to his/her office.  If you experience worsening of your admission symptoms, develop shortness of breath, life threatening emergency, suicidal or homicidal thoughts you must seek medical attention immediately by calling 911 or calling your MD immediately if symptoms less severe.  You must read complete instructions/literature along with all the possible adverse reactions/side effects for all the Medicines you take and that have been prescribed to you. Take any new Medicines after you have completely understood and accpet all the possible adverse reactions/side effects.   Do not drive when taking Pain medications.   Do not take more than prescribed Pain, Sleep and Anxiety Medications  Special Instructions: If you have smoked or chewed Tobacco in the last 2 yrs please stop smoking, stop any regular Alcohol and or any Recreational drug use.  Wear Seat belts while driving.  Please note  You were cared for by a hospitalist during your hospital stay. Once you are discharged, your primary care physician will handle any further medical issues. Please note that NO REFILLS for any discharge medications will be authorized once you are discharged, as it is imperative that you return to your primary care physician (or establish a relationship with a primary care physician if you do not have one) for your aftercare needs so that they can reassess your need for medications and monitor your lab values.     Allergies  Allergen Reactions  . Dextromethorphan Rash      Disposition: 01-Home or Self Care   Consults:  None    Significant Diagnostic Studies:  Ct Chest Wo Contrast  Result Date: 05/20/2017 CLINICAL DATA:  Acute respiratory illness. Progressive weakness. Flu like symptoms. EXAM: CT CHEST WITHOUT CONTRAST TECHNIQUE: Multidetector CT  imaging of the chest was performed following the standard protocol without IV contrast. COMPARISON:  11/26/2016 FINDINGS: Cardiovascular: Normal heart size. No pericardial effusion. Atherosclerosis with extensive atherosclerotic calcification of the coronaries. Mediastinum/Nodes: Remote granulomatous disease affecting left hilar and mediastinal lymph nodes. Small incidental thyroid nodules. Lungs/Pleura: Generalized airway thickening that was not seen previously. Subpleural reticulation in the lingula and left lower lobe with volume loss, atelectatic appearing. No consolidative opacities. Mild distal bronchiectasis and mucoid impaction in the lateral segment right middle lobe, chronic. No effusion or Kerley lines. Upper Abdomen: Preceding abdominal CT that was reported separately. Musculoskeletal: Degenerative changes with scoliosis. No acute osseous finding. Remote posterior right seventh and eighth rib fractures. IMPRESSION: 1. History of influenza which generalized bronchitis. Atelectatic type opacities in the lingula in the left lower lobe. No consolidating  pneumonia. 2. Aortic Atherosclerosis (ICD10-I70.0). Extensive coronary atherosclerosis. Electronically Signed   By: Monte Fantasia M.D.   On: 05/20/2017 16:35   Ct Abdomen Pelvis W Contrast  Result Date: 05/20/2017 CLINICAL DATA:  Nausea and vomiting x1 day with progressive weakness. EXAM: CT ABDOMEN AND PELVIS WITH CONTRAST TECHNIQUE: Multidetector CT imaging of the abdomen and pelvis was performed using the standard protocol following bolus administration of intravenous contrast. CONTRAST:  45m ISOVUE-300 IOPAMIDOL (ISOVUE-300) INJECTION 61% COMPARISON:  06/21/2013 FINDINGS: Lower chest: Heart size is top normal without pericardial effusion or thickening. There is dependent atelectasis at the lung bases left greater than right. There is a small hiatal hernia present. Hepatobiliary: Status post cholecystectomy. Mild hepatic steatosis. Slight surface  nodularity of the liver may represent morphologic changes of cirrhosis. No space-occupying mass or biliary dilatation is noted. Pancreas: Normal Spleen: Small calcification in the anterior aspect of the spleen compatible with a granuloma. No splenomegaly or mass. Adrenals/Urinary Tract: Normal bilateral adrenal glands. No obstructive uropathy. No enhancing renal masses. Tiny bilateral subcentimeter renal hypodensities measuring on the order of 4 and 5 mm are noted, too small to further characterize but more commonly associated with small cysts nondistended urinary bladder which is slightly thick-walled in appearance likely due to underdistention. Correlate to exclude a cystitis. Stomach/Bowel: Nondistended stomach without mural thickening. Normal small bowel rotation is noted without obstruction or inflammation. Normal appearing appendix. Nondistended stool-filled large bowel with scattered colonic diverticulosis. No acute large bowel obstruction or inflammation. Vascular/Lymphatic: Moderate aortoiliac atherosclerosis. No lymphadenopathy. Reproductive: Status post hysterectomy. No adnexal masses. Other: No free air nor free fluid. Small fat containing periumbilical hernia. Musculoskeletal: L2 through L4 lumbar spinal fusion with interbody blocks in place. Multilevel degenerative disc disease with vacuum disc phenomena of the included lower thoracic and remainder of lumbar spine. Right hip arthroplasty noted. No apparent hardware failure. No acute osseous abnormality is seen. IMPRESSION: 1. Scattered colonic diverticulosis without acute diverticulitis. No acute bowel obstruction or inflammation. 2. Mild hepatic steatosis. Slight surface nodularity of the liver may represent morphologic changes of cirrhosis. 3. Small splenic granuloma. 4. Tiny subcentimeter renal hypodensities statistically consistent with cysts but too small to further characterize. 5. Moderate aortoiliac atherosclerosis. 6. Thoracolumbar spondylosis  with spinal fusion hardware L2 through L4. Electronically Signed   By: DAshley RoyaltyM.D.   On: 05/20/2017 13:38   Dg Chest Port 1 View  Result Date: 05/20/2017 CLINICAL DATA:  Sepsis.  Fever.  Vomiting. EXAM: PORTABLE CHEST 1 VIEW COMPARISON:  CT 11/26/2016.  Chest x-ray 11/25/2016. FINDINGS: Mediastinum hilar structures normal. Cardiomegaly with normal pulmonary vascularity. Mild right mid lung field infiltrate cannot be excluded. No pleural effusion or pneumothorax. IMPRESSION: Mild right mid lung field infiltrate cannot be excluded. Electronically Signed   By: TMarcello Moores Register   On: 05/20/2017 11:43    echocardiogram  - Left ventricle: The cavity size was normal. Systolic function was   normal. The estimated ejection fraction was in the range of 60%   to 65%. Wall motion was normal; there were no regional wall   motion abnormalities. Features are consistent with a pseudonormal   left ventricular filling pattern, with concomitant abnormal   relaxation and increased filling pressure (grade 2 diastolic   dysfunction). - Aortic valve: There was mild regurgitation. - Mitral valve: Calcified annulus. - Left atrium: The atrium was moderately dilated. - Pulmonary arteries: PA peak pressure: 31 mm Hg (S). - Pericardium, extracardiac: There was a left pleural effusion.  Filed Weights   05/23/17 0500 05/24/17 0355 05/26/17 0500  Weight: 84 kg (185 lb 3 oz) 86.7 kg (191 lb 2.2 oz) 80.1 kg (176 lb 9.4 oz)     Microbiology: Recent Results (from the past 240 hour(s))  Blood Culture (routine x 2)     Status: None   Collection Time: 05/20/17 11:07 AM  Result Value Ref Range Status   Specimen Description BLOOD LEFT FOREARM  Final   Special Requests   Final    Blood Culture adequate volume BOTTLES DRAWN AEROBIC AND ANAEROBIC   Culture   Final    NO GROWTH 5 DAYS Performed at [facility], [address], [place], [place] [address]    Report Status 05/25/2017 FINAL  Final  Blood  Culture (routine x 2)     Status: None   Collection Time: 05/20/17 11:08 AM  Result Value Ref Range Status   Specimen Description BLOOD LEFT FOREARM  Final   Special Requests   Final    Blood Culture adequate volume BOTTLES DRAWN AEROBIC ONLY   Culture   Final    NO GROWTH 5 DAYS Performed at [facility], [place] [address] [place], [place] [address]    Report Status 05/25/2017 FINAL  Final  Urine culture     Status: Abnormal   Collection Time: 05/20/17  2:44 PM  Result Value Ref Range Status   Specimen Description URINE, CATHETERIZED  Final   Special Requests NONE  Final   Culture (A)  Final    <10,000 COLONIES/mL INSIGNIFICANT GROWTH Performed at [facility], [place] [address], [place], [place] [address]    Report Status 05/21/2017 FINAL  Final  MRSA PCR Screening     Status: None   Collection Time: 05/20/17  7:03 PM  Result Value Ref Range Status   MRSA by PCR NEGATIVE NEGATIVE Final    Comment:        The GeneXpert MRSA Assay (FDA approved for NASAL specimens only), is one component of a comprehensive MRSA colonization surveillance program. It is not intended to diagnose MRSA infection nor to guide or monitor treatment for MRSA infections. Performed at [facility], [place] [address] [place], [place] [address]        Blood Culture    Component Value Date/Time   SDES URINE, CATHETERIZED 05/20/2017 1444   SPECREQUEST NONE 05/20/2017 1444   CULT (A) 05/20/2017 1444    <10,000 COLONIES/mL INSIGNIFICANT GROWTH Performed at [place] [address], [place], [place] [address]    REPTSTATUS 05/21/2017 FINAL 05/20/2017 1444      Labs: Results for orders placed or performed during the hospital encounter of 05/20/17 (from the past 48 hour(s))  Magnesium     Status: Abnormal   Collection Time: 05/25/17  9:02 AM  Result Value Ref Range   Magnesium 1.4 (L) 1.7 - 2.4 mg/dL    Comment: Performed at [place] [address],  [place],  [address]  Basic metabolic panel     Status: Abnormal   Collection Time: 05/25/17  9:02 AM  Result Value Ref Range   Sodium 141 135 - 145 mmol/L   Potassium 2.7 (LL) 3.5 - 5.1 mmol/L    Comment: CRITICAL RESULT CALLED TO, READ BACK BY AND VERIFIED WITH: M.MICHAEL,RN 05/25/17 1008 BY BSLADE    Chloride 97 (L) 101 - 111 mmol/L   CO2 27 22 - 32 mmol/L   Glucose, Bld 106 (H)  65 - 99 mg/dL   BUN 8 6 - 20 mg/dL   Creatinine, Ser 0.71 0.44 - 1.00 mg/dL   Calcium 8.5 (L) 8.9 - 10.3 mg/dL   GFR calc non Af Amer >60 >60 mL/min   GFR calc Af Amer >60 >60 mL/min    Comment: (NOTE) The eGFR has been calculated using the CKD EPI equation. This calculation has not been validated in all clinical situations. eGFR's persistently <60 mL/min signify possible Chronic Kidney Disease.    Anion gap 17 (H) 5 - 15    Comment: Performed at [facility], [place] [address] [place], [place] [address]  TSH     Status: Abnormal   Collection Time: 05/25/17  9:38 AM  Result Value Ref Range   TSH 0.302 (L) 0.350 - 4.500 uIU/mL    Comment: Performed by a 3rd Generation assay with a functional sensitivity of <=0.01 uIU/mL. Performed at [facility], [place] [address], [place], [place] [address]   T4, free     Status: None   Collection Time: 05/25/17  9:38 AM  Result Value Ref Range   Free T4 1.07 0.61 - 1.12 ng/dL    Comment: (NOTE) Biotin ingestion may interfere with free T4 tests. If the results are inconsistent with the TSH level, previous test results, or the clinical presentation, then consider biotin interference. If needed, order repeat testing after stopping biotin. Performed at [facility], [place] [address] [place], [place] [address]   Magnesium     Status: None   Collection Time: 05/26/17  2:32 AM  Result Value Ref Range   Magnesium 2.2 1.7 - 2.4 mg/dL    Comment: Performed at [facility], [place] [address] [place],  [address]  Comprehensive metabolic panel      Status: Abnormal   Collection Time: 05/26/17  2:32 AM  Result Value Ref Range   Sodium 139 135 - 145 mmol/L   Potassium 4.0 3.5 - 5.1 mmol/L    Comment: DELTA CHECK NOTED   Chloride 95 (L) 101 - 111 mmol/L   CO2 33 (H) 22 - 32 mmol/L   Glucose, Bld 115 (H) 65 - 99 mg/dL   BUN 16 6 - 20 mg/dL   Creatinine, Ser 0.80 0.44 - 1.00 mg/dL   Calcium 8.3 (L) 8.9 - 10.3 mg/dL   Total Protein 5.5 (L) 6.5 - 8.1 g/dL   Albumin 3.0 (L) 3.5 - 5.0 g/dL   AST 31 15 - 41 U/L   ALT 29 14 - 54 U/L   Alkaline Phosphatase 43 38 - 126 U/L   Total Bilirubin 0.6 0.3 - 1.2 mg/dL   GFR calc non Af Amer >60 >60 mL/min   GFR calc Af Amer >60 >60 mL/min    Comment: (NOTE) The eGFR has been calculated using the CKD EPI equation. This calculation has not been validated in all clinical situations. eGFR's persistently <60 mL/min signify possible Chronic Kidney Disease.    Anion gap 11 5 - 15    Comment: Performed at [place] [address] [place], [place] [address]     Lipid Panel     Component Value Date/Time   CHOL 148 11/13/2013 1403   TRIG 65.0 11/13/2013 1403   HDL 68.70 11/13/2013 1403   CHOLHDL 2 11/13/2013 1403   VLDL 13.0 11/13/2013 1403   LDLCALC 66 11/13/2013 1403     Lab Results  Component Value Date   HGBA1C 5.8 03/31/2017  HGBA1C 5.8 11/24/2012   HGBA1C 5.7 (H) 10/30/2012     Lab Results  Component Value Date   LDLCALC 66 11/13/2013   CREATININE 0.80 05/26/2017     HPI   Patient is an 83 year old female with past medical history significant for paroxysmal atrial fibrillation, mild coronary artery disease, hypertension, giant cell arteritis on chronic prednisone, vocal cord dysfunction, hemorrhoids and diverticulosis.  Patient is admitted with cough that is productive of greenish sputum, nausea and vomiting, with associated chills.  Patient found to be positive for influenza A/right middle lobe infiltrate, admitted for sepsis and pneumonia     HOSPITAL  COURSE   Sepsisw/Acute respiratory failure with hypoxia2/2Influenza A  Found to be positive for influenza PCR A  , no pneumonia on CT -Initially met Sepsis criteria  : Fever greater than 102 F, tachypnea, acute hypoxemia, serum lactate greater than 2.0, source of infection;  - Initially admitted to  step down -Chest x-ray questions possible right middle lobe infiltrate  , however CT of the chest completed on 2/21 showed  Atelectatic type opacities in the lingula in the left lower lobe. No consolidating pneumonia.  Initial pro-calcitonin 1.46 -Improved lactic acidosis during this hospitalization with volume resuscitation  Blood culture no growth so far, urine culture with insignificant growth Initially started on treatment for community-acquired , vancomycin, Rocephin, azithromycin, all antibiotics discontinued 2/25 ,  -Currently needing 2L at rest. Will check ambulatory sats prior to discharge to see the patient needs home oxygen Patient initially started on stress dose steroids with Solu-Cortef , now switched  To oral prednisone  with a slow taper, back to home dose of 10 mg a day     Abnormal urinalysis  Urine culture nonspecific with insignificant growth  Received Rocephin  2/21-2/25    Acute hypokalemia 2.7/hypomagnesemia Both repleted aggressively  Mild acute kidney injury: -This is most likely prerenal.  Improving. Resolved after IV hydration -Continue to monitor.   HTN (hypertension)  Initial hypotension resolved, blood pressure now stable   Resumed  Cozaar and metoprolol    Remote history ofPAF (paroxysmal atrial fibrillation) Chronic diastolic heart failure without exacerbation  Likely exacerbated by hypokalemia, hypomagnesemia,  2-D echo  shows EF of 60-65% with grade 2 diastolic dysfunction, no signs of exacerbation, resume Lasix on a daily basis recommend a 30 day outpatient monitor - if there is recurrence, then would stop Plavix and recommend starting  Eliquis. Plan follow-up with Dr. Angelena Form after her monitor.  History of giant cell arteritis -On prednisone 10 mg daily. -Continue to monitor.  Coronary disease disease: -Documented as mild disease involving the LAD and circumflex based on most recent cardiac catheterization in 2014 -Previously had stent to RCA placed in [place] in 2006 -resume  beta-blocker Continue Plavix    Back pain, chronic: Start patient on opiate-based analgesics as needed.   Discharge Exam  Blood pressure (!) 181/76, pulse 78, temperature 98.6 F (37 C), temperature source Oral, resp. rate (!) 21, height _0  (1.626 m), weight 80.1 kg (176 lb 9.4 oz), SpO2 98 %.  General exam: Patient looks better today.  Not in any distress.  Awake and alert.     Respiratory system: Expiratory wheeze, mild to was an expiratory wheeze.  Cardiovascular system: S1 & S2.  Minimal pedal edema. Gastrointestinal system: Abdomen is nondistended, soft and nontender. No organomegaly or masses felt. Normal bowel sounds heard. Central nervous system: Alert and oriented. No focal neurological deficits. Extremities: Minimal leg edema.  Follow-up Information    Burchette, Alinda Sierras, MD. Call.   Specialty:  Family Medicine Why:  Hospital follow-up in 3-5 days, CBC, CMP in 3-5 days Contact information: [place] [address] [phone]           Signed: Reyne Dumas 05/26/2017, 8:49 AM        Time spent >1 hour

## 2017-05-26 NOTE — Progress Notes (Signed)
Pt given discharge instructions and medications reviewed.  All information reviewed with daughter and pt.  Time for questions provided.  All questions answered and understanding verbalized.  No additional questions.  Pt said goodbye to husband who is pt in another room.  Pt taken downstairs for discharge via her own transfer chair to car for home with family.  No s/s of distress at this time.

## 2017-05-26 NOTE — Telephone Encounter (Signed)
Betty Jackson (patient daughter) calling,  States that patient is in hospital and had AFIB yesterday in the morning and late at night. Melissa states that patient BP spiked and patient is very concerned. Patient would like to make Dr. Angelena Form aware and have his recommendations.

## 2017-05-26 NOTE — Progress Notes (Signed)
Physical Therapy Treatment Patient Details Name: Betty Jackson MRN: [id number] DOB: [birth year] Today's Date: 05/26/2017    History of Present Illness 83 y.o. female admitted  admitted with productive cough, nausea and vomiting, and chills. Influenza A positive. PMH significant for paroxysmal a-fib, CAD, HTN, giant cell arteritis, on chronic prednisone, vocal cord dysfunction, hemorrhoids, and diverticulosis.     PT Comments    Pt admitted with above diagnosis. Pt currently with functional limitations due to endurance deficits. Pt ambulating well on RA with RW at supervision to Modif I level.  Agree with Home First program.  Pt will benefit from skilled PT to increase their independence and safety with mobility to allow discharge to the venue listed below.     Follow Up Recommendations  Home health PT;Supervision for mobility/OOB     Equipment Recommendations  None recommended by PT    Recommendations for Other Services       Precautions / Restrictions Precautions Precautions: Fall Restrictions Weight Bearing Restrictions: No    Mobility  Bed Mobility Overal bed mobility: Needs Assistance Bed Mobility: Supine to Sit     Supine to sit: Independent        Transfers Overall transfer level: Needs assistance Equipment used: Rolling walker (2 wheeled) Transfers: Sit to/from Stand Sit to Stand: Independent            Ambulation/Gait Ambulation/Gait assistance: Supervision;Modified independent (Device/Increase time) Ambulation Distance (Feet): 280 Feet Assistive device: Rolling walker (2 wheeled) Gait Pattern/deviations: Step-through pattern;Decreased stride length Gait velocity: decreased Gait velocity interpretation: Below normal speed for age/gender General Gait Details: Pt was able to ambulate with RW with good safety awareness.  Pt sats >90% on RA throughout walk.  Does not desat at rest or with activity.    Stairs            Wheelchair Mobility     Modified Rankin (Stroke Patients Only)       Balance Overall balance assessment: Independent                                          Cognition Arousal/Alertness: Awake/alert Behavior During Therapy: WFL for tasks assessed/performed;Anxious Overall Cognitive Status: Within Functional Limits for tasks assessed                                        Exercises      General Comments        Pertinent Vitals/Pain Faces Pain Scale: No hurt    Home Living                      Prior Function            PT Goals (current goals can now be found in the care plan section) Acute Rehab PT Goals Patient Stated Goal: to get better Progress towards PT goals: Progressing toward goals    Frequency    Min 3X/week      PT Plan Current plan remains appropriate    Co-evaluation              AM-PAC PT "6 Clicks" Daily Activity  Outcome Measure  Difficulty turning over in bed (including adjusting bedclothes, sheets and blankets)?: None Difficulty moving from lying on back to sitting on the side of the  bed? : None Difficulty sitting down on and standing up from a chair with arms (e.g., wheelchair, bedside commode, etc,.)?: None Help needed moving to and from a bed to chair (including a wheelchair)?: None Help needed walking in hospital room?: None Help needed climbing 3-5 steps with a railing? : A Little 6 Click Score: 23    End of Session Equipment Utilized During Treatment: Gait belt Activity Tolerance: Patient tolerated treatment well Patient left: in chair;with call bell/phone within reach;with family/visitor present;with nursing/sitter in room(Pt left visiting husband, pt in room 2W04 with nurse aware) Nurse Communication: Mobility status PT Visit Diagnosis: Unsteadiness on feet (R26.81);Muscle weakness (generalized) (M62.81)     Time: 5379-4327 PT Time Calculation (min) (ACUTE ONLY): 21 min  Charges:  $Gait  Training: 8-22 mins                    G Codes:       Erron Wengert,PT Acute Rehabilitation [phone] (pager)    Denice Paradise 05/26/2017, 12:31 PM

## 2017-05-26 NOTE — Consult Note (Signed)
   [facility] CM Inpatient Consult   05/26/2017  Betty Jackson 15-Feb-1935 [id number]  Referral received for patient with [facility] in the Medicare ACO.  Met with the patient at the bedside.  Patient states she understand the program and daughter [name] [not at the bedside] helped her to understand she states. [name] that [facility] [employer] can be contacted for transportation assistance or pharmacy follow up, if needed.  Consent form signed and copy with folder given.  Explained the 24 hour nurse advise line.    [name], RN BSN [facility] Liaison  [phone] business mobile phone Toll free office [phone]

## 2017-05-26 NOTE — Care Management Note (Signed)
Case Management Note  Patient Details  Name: Betty Jackson MRN: [id number] Date of Birth: 09/12/1934  Subjective/Objective:     From home with spouse, pta indep, she has a w/chair, a rollator at home. She has PCP and medication coverage. Patient states she would like to have a transportation chair , NCM informed MD and left narrative form on patients chart for MD to make a note in chart if he feels this is appropriate for patient.  2/27 [place], BSN- NCM spoke with patient and daughter regarding Home First Program with [facility] thru [facility], patient is very interested and daughter is interested also.  [place] with [facility] will come back by to see patient and Tommi Rumps will also check paper work to make sure everything is ok.                  Action/Plan: Patient will benefit from [place] with [facility].  Expected Discharge Date:  05/24/17               Expected Discharge Plan:  [place]  In-House Referral:     Discharge planning Services  CM Consult  Post Acute Care Choice:    Choice offered to:     DME Arranged:  Other see comment(transport chair) DME Agency:  [facility](Home First Program)  HH Arranged:  PT, Nurse's Aide, OT, RN [facility] Agency:     Status of Service:  Completed, signed off  If discussed at [place] of Stay Meetings, dates discussed:    Additional Comments:  Zenon Mayo, RN 05/26/2017, 11:27 AM

## 2017-05-27 ENCOUNTER — Telehealth: Payer: Self-pay | Admitting: Family Medicine

## 2017-05-27 DIAGNOSIS — A4189 Other specified sepsis: Secondary | ICD-10-CM | POA: Diagnosis not present

## 2017-05-27 DIAGNOSIS — J09X1 Influenza due to identified novel influenza A virus with pneumonia: Secondary | ICD-10-CM | POA: Diagnosis not present

## 2017-05-27 NOTE — Telephone Encounter (Signed)
Transition Care Management Follow-up Telephone Call  Physician Discharge Summary  Betty Jackson MRN: [id number] DOB/AGE: 81-09-1934 83 y.o.  PCP: Eulas Post, MD   Admit date: 05/20/2017 Discharge date: 05/26/2017  Discharge Diagnoses:    Principal Problem:   Influenza A Active Problems:   Sepsis ([place])   Acute respiratory failure with hypoxia (HCC)   CKD (chronic kidney disease) stage 3, GFR 30-59 ml/min (HCC)   PAF (paroxysmal atrial fibrillation) (HCC)   HTN (hypertension)   Giant cell arteritis (HCC)   Coronary disease   Abnormal urinalysis    Follow-up recommendations Follow-up with PCP in 3-5 days , including all  additional recommended appointments as below Follow-up CBC, CMP, magnesium in 3-5 days Patient is being discharged with home health recommend a 30 day outpatient monitor , cardiology notified     How have you been since you were released from the hospital? "not feeling well at all"   Do you understand why you were in the hospital? yes   Do you understand the discharge instructions? yes   Where were you discharged to? Home   Items Reviewed:  Medications reviewed: yes  Allergies reviewed: yes  Dietary changes reviewed: yes  Referrals reviewed: yes   Functional Questionnaire:   Activities of Daily Living (ADLs):   She states they are independent in the following: none States they require assistance with the following: ambulation, bathing and hygiene, feeding, continence, grooming, toileting and dressing   Any transportation issues/concerns? no   Any patient concerns? no   Confirmed importance and date/time of follow-up visits scheduled yes  Provider Appointment booked with Dr. Elease Hashimoto on 06/01/2017 Tuesday at 3:00 pm  Confirmed with patient if condition begins to worsen call PCP or go to the ER.  Patient was given the office number and encouraged to call back with question or concerns.  : yes

## 2017-05-27 NOTE — Telephone Encounter (Signed)
Rx done. 

## 2017-05-27 NOTE — Telephone Encounter (Signed)
I tried to reach pt and no answer or option to leave a message, will try again later.

## 2017-05-27 NOTE — Telephone Encounter (Signed)
Refill once 

## 2017-05-27 NOTE — Telephone Encounter (Signed)
Copied from [place]. Topic: Quick Communication - See Telephone Encounter >> May 27, 2017 12:20 PM Ivar Drape wrote: CRM for notification. See Telephone encounter for:  05/27/17. Tillman Abide RN w/[facility] [phone] would like a prescription for Hycodan or Hydrocodone/Homatrocine 5-1.5mg  for 5 ml, 1 Teaspoon every 6hrs as needed for cough.  Patient was released from the hospital last night and they were giving her this medication, but didn't give her a prescription refill.

## 2017-05-27 NOTE — Telephone Encounter (Signed)
Can you ask Dr. Volanda Napoleon about this? If not approved then please forward.

## 2017-05-27 NOTE — Telephone Encounter (Signed)
Please advise 

## 2017-05-27 NOTE — Telephone Encounter (Signed)
Last rx given on 03/31/2017 for #20 with no ref

## 2017-05-28 ENCOUNTER — Telehealth: Payer: Self-pay | Admitting: Cardiovascular Disease

## 2017-05-28 DIAGNOSIS — A4189 Other specified sepsis: Secondary | ICD-10-CM | POA: Diagnosis not present

## 2017-05-28 DIAGNOSIS — J09X1 Influenza due to identified novel influenza A virus with pneumonia: Secondary | ICD-10-CM | POA: Diagnosis not present

## 2017-05-28 MED ORDER — HYDROCODONE-HOMATROPINE 5-1.5 MG/5ML PO SYRP: ORAL_SOLUTION | ORAL | 0 refills | Status: DC

## 2017-05-28 NOTE — Telephone Encounter (Signed)
Rx printed and I called the pt and left a detailed message at her cell the Rx was left at the front desk for pick up.

## 2017-05-28 NOTE — Telephone Encounter (Signed)
May write for Hycodan one tsp po q hs prn severe cough  #60 ml    I do not recommend she take Restoril with this.

## 2017-05-29 ENCOUNTER — Other Ambulatory Visit: Payer: Self-pay | Admitting: Family Medicine

## 2017-05-31 DIAGNOSIS — A4189 Other specified sepsis: Secondary | ICD-10-CM | POA: Diagnosis not present

## 2017-05-31 DIAGNOSIS — J09X1 Influenza due to identified novel influenza A virus with pneumonia: Secondary | ICD-10-CM | POA: Diagnosis not present

## 2017-06-01 ENCOUNTER — Inpatient Hospital Stay: Payer: Medicare Other | Admitting: Family Medicine

## 2017-06-01 ENCOUNTER — Telehealth: Payer: Self-pay | Admitting: Family Medicine

## 2017-06-01 DIAGNOSIS — J09X1 Influenza due to identified novel influenza A virus with pneumonia: Secondary | ICD-10-CM | POA: Diagnosis not present

## 2017-06-01 DIAGNOSIS — A4189 Other specified sepsis: Secondary | ICD-10-CM | POA: Diagnosis not present

## 2017-06-01 NOTE — Telephone Encounter (Signed)
Copied from [place]. Topic: Quick Communication - See Telephone Encounter >> Jun 01, 2017  9:38 AM Neva Seat wrote: Noni Saupe [phone] - a verbal ok/message can be left on secure voicemail.  Verbal Orders:  OT 1 time a week for 2 weeks - ADL transfers exercise - DC when goals are met.

## 2017-06-01 NOTE — Telephone Encounter (Signed)
Verbal orders given to Don  

## 2017-06-01 NOTE — Telephone Encounter (Signed)
ok 

## 2017-06-02 ENCOUNTER — Ambulatory Visit: Payer: Medicare Other | Admitting: Physician Assistant

## 2017-06-04 DIAGNOSIS — A4189 Other specified sepsis: Secondary | ICD-10-CM | POA: Diagnosis not present

## 2017-06-04 DIAGNOSIS — J09X1 Influenza due to identified novel influenza A virus with pneumonia: Secondary | ICD-10-CM | POA: Diagnosis not present

## 2017-06-07 ENCOUNTER — Encounter: Payer: Self-pay | Admitting: Family Medicine

## 2017-06-07 ENCOUNTER — Ambulatory Visit (INDEPENDENT_AMBULATORY_CARE_PROVIDER_SITE_OTHER): Payer: Medicare Other | Admitting: Family Medicine

## 2017-06-07 VITALS — BP 110/72 | HR 60 | Temp 98.0°F | Wt 162.0 lb

## 2017-06-07 DIAGNOSIS — M316 Other giant cell arteritis: Secondary | ICD-10-CM | POA: Diagnosis not present

## 2017-06-07 DIAGNOSIS — E876 Hypokalemia: Secondary | ICD-10-CM | POA: Diagnosis not present

## 2017-06-07 DIAGNOSIS — J101 Influenza due to other identified influenza virus with other respiratory manifestations: Secondary | ICD-10-CM | POA: Diagnosis not present

## 2017-06-07 DIAGNOSIS — N183 Chronic kidney disease, stage 3 unspecified: Secondary | ICD-10-CM

## 2017-06-07 DIAGNOSIS — J9601 Acute respiratory failure with hypoxia: Secondary | ICD-10-CM | POA: Diagnosis not present

## 2017-06-07 LAB — COMPREHENSIVE METABOLIC PANEL
ALBUMIN: 4 g/dL (ref 3.5–5.2)
ALT: 22 U/L (ref 0–35)
AST: 17 U/L (ref 0–37)
Alkaline Phosphatase: 62 U/L (ref 39–117)
BUN: 15 mg/dL (ref 6–23)
CHLORIDE: 97 meq/L (ref 96–112)
CO2: 34 mEq/L — ABNORMAL HIGH (ref 19–32)
Calcium: 9.7 mg/dL (ref 8.4–10.5)
Creatinine, Ser: 0.82 mg/dL (ref 0.40–1.20)
GFR: 71.13 mL/min (ref >60.00)
Glucose, Bld: 94 mg/dL (ref 70–99)
POTASSIUM: 5.3 meq/L — AB (ref 3.5–5.1)
SODIUM: 136 meq/L (ref 135–145)
Total Bilirubin: 1.2 mg/dL (ref 0.2–1.2)
Total Protein: 6.6 g/dL (ref 6.0–8.3)

## 2017-06-07 LAB — SEDIMENTATION RATE: SED RATE: 8 mm/h (ref 0–30)

## 2017-06-07 LAB — MAGNESIUM: MAGNESIUM: 2.1 mg/dL (ref 1.5–2.5)

## 2017-06-07 LAB — C-REACTIVE PROTEIN: CRP: 0.3 mg/dL — AB (ref 0.5–20.0)

## 2017-06-07 NOTE — Progress Notes (Signed)
Subjective:     Patient ID: Betty Jackson, female   DOB: 08-24-1934, 84 y.o.   MRN: [id number]  HPI Patient seen for hospital follow-up for recent influenza. She was admitted on February 21. She developed acute respiratory with hypoxia. Initially was concern for possible sepsis. Her blood culture and urine cultures came back negative. Positive screen for influenza a. Her initial chest x-ray showed concern for possible pneumonia but this was not confirmed on CAT scan.  She had some lactic acidosis which improved with volume resuscitation. Was initially treated with broad-spectrum antibodies for community acquired pneumonia and these were discontinued on the 25th. There was no need for home oxygen at discharge. She was discharged on taper of prednisone. She is on baseline of prednisone 10 mg daily for temporal arteritis. She did have some electrolyte disturbance with potassium 2.7 and magnesium 1.4 these were both repleted. Her hypertension was apparently out of control during a lot of her hospitalization but improved after getting back on Cozaar and metoprolol. She had echocardiogram with ejection fraction 60-65%. Recommended 30 day outpatient monitor which will be set up 2 days from now. She did apparently have some transient atrial fibrillation with past history of paroxysmal atrial fibrillation.  History of CAD. Denies any recent chest pain. Now back on beta blocker and blood pressure much improved today.  Appetite is improved. She has less dyspnea. No cough. No recurrent fever. No dysuria. Denies any nausea, vomiting, or diarrhea  Past Medical History:  Diagnosis Date  . Anxiety state, unspecified 10/28/2012  . Bruises easily    d/t being on prednisone and plavix  . CAD (coronary artery disease)    a. Stent RCA in [facility];  b. Cath approx 2009 - nonobs per pt report.  . Cataract    immature on the left eye  . Chronic lower back pain    scoliosis  . CKD (chronic kidney disease) stage  3, GFR 30-59 ml/min (HCC)   . Complication of anesthesia    pt has a very high tolerance to meds  . DDD (degenerative disc disease)   . Depression   . Diverticulitis    hx of  . Diverticulosis   . Enteritis   . Giant cell arteritis ([place])   . Hemorrhoids   . History of scabies   . Hyperlipidemia    takes Lipitor daily  . Hypertension    takes Amlodipine,Losartan,Metoprolol,and HCTZ daily  . Incontinence of urine   . Insomnia    takes Restoril nightly  . Joint pain   . Migraines    "back in my 20's; none since" (10/28/2012)  . Osteopenia   . Osteoporosis   . PAF (paroxysmal atrial fibrillation) ([place]) 2011   a. lone epidode in 2011 according to notes.  . Rheumatoid arthritis ([place])   . Scoliosis   . Sinus bradycardia    a. on chronic bb  . Temporal arteritis ([place]) 2011   a. followed @ [facility]; potential flareup 10/28/2012/notes 10/28/2012  . Urinary frequency   . Vocal cord dysfunction    "they don't operate properly" (10/28/2012)   Past Surgical History:  Procedure Laterality Date  . ABDOMINAL HYSTERECTOMY  ~ 1984   vaginally  . BACK SURGERY  7-21yrs ago   X Stop  . BLADDER SUSPENSION  2001  . BREAST BIOPSY Right   . CATARACT EXTRACTION W/ INTRAOCULAR LENS IMPLANT Right ~ 08/2012  . COLONOSCOPY  01/26/2012   Procedure: COLONOSCOPY;  Surgeon: Ladene Artist, MD,FACG;  Location: [facility] ENDOSCOPY;  Service: Endoscopy;  Laterality: N/A;  note the EGD is possible  . CORONARY ANGIOPLASTY WITH STENT PLACEMENT  2006   X 1 stent  . EPIDURAL BLOCK INJECTION    . ESOPHAGOGASTRODUODENOSCOPY  01/26/2012   Procedure: ESOPHAGOGASTRODUODENOSCOPY (EGD);  Surgeon: Ladene Artist, MD,FACG;  Location: [facility] ENDOSCOPY;  Service: Endoscopy;  Laterality: N/A;  . HEMIARTHROPLASTY HIP Right 2012  . LAPAROSCOPIC CHOLECYSTECTOMY  2001  . LUMBAR FUSION  03/2013  . LUMBAR LAMINECTOMY/DECOMPRESSION MICRODISCECTOMY Right 11/04/2015   Procedure: Right Lumbar Five-Sacral One Microdiskectomy;  Surgeon: Kristeen Miss,  MD;  Location: [facility] NEURO ORS;  Service: Neurosurgery;  Laterality: Right;  Right L5-S1 Microdiskectomy  . moles removed that required stiches     one on leg and one on face  . TEMPORAL ARTERY BIOPSY / LIGATION Bilateral 2011  . TONSILLECTOMY AND ADENOIDECTOMY     at age 79  . TUBAL LIGATION  ~ 1982  . X-STOP IMPLANTATION  ~ 2010   "lower back" (10/28/2012)    reports that  has never smoked. she has never used smokeless tobacco. She reports that she drinks alcohol. She reports that she does not use drugs. family history includes Brain cancer (age of onset: 51) in her mother; Breast cancer (age of onset: 63) in her daughter; Heart attack in her father; Heart disease in her other; Hypertension in her other. Allergies  Allergen Reactions  . Dextromethorphan Rash     Review of Systems  Constitutional: Positive for fatigue. Negative for chills and fever.  Respiratory: Negative for cough and shortness of breath.   Cardiovascular: Negative for chest pain.  Gastrointestinal: Negative for abdominal pain, nausea and vomiting.  Genitourinary: Negative for dysuria.  Neurological: Negative for dizziness and headaches.  Hematological: Negative for adenopathy.       Objective:   Physical Exam  Constitutional: She appears well-developed and well-nourished.  HENT:  Mouth/Throat: Oropharynx is clear and moist.  Eyes: Pupils are equal, round, and reactive to light.  Neck: Neck supple. No JVD present. No thyromegaly present.  Cardiovascular: Normal rate and regular rhythm. Exam reveals no gallop.  Pulmonary/Chest: Effort normal and breath sounds normal. No respiratory distress. She has no wheezes. She has no rales.  Musculoskeletal: She exhibits no edema.  Lymphadenopathy:    She has no cervical adenopathy.  Neurological: She is alert.       Assessment:     #1 recent influenza A clinically improving following administration of IV fluids and hospitalization.  Respiratory status now stable  #2  recent hypokalemia  #3 recent hypomagnesemia  #4 hypertension. Currently stable and at goal  #5 history of transient atrial fibrillation    Plan:     -repeat labs today with comprehensive metabolic panel and magnesium level -follow-up immediately for any recurrent fever or other concerns -Continue with home health follow-up for now for monitoring vitals -Event monitor pending -Routine follow-up here in 3 months and sooner as needed  Eulas Post MD [facility]

## 2017-06-07 NOTE — Patient Instructions (Signed)
Follow up for any recurrent fever or other concerns.   

## 2017-06-08 ENCOUNTER — Other Ambulatory Visit: Payer: Self-pay | Admitting: Family Medicine

## 2017-06-08 DIAGNOSIS — J09X1 Influenza due to identified novel influenza A virus with pneumonia: Secondary | ICD-10-CM | POA: Diagnosis not present

## 2017-06-08 DIAGNOSIS — A4189 Other specified sepsis: Secondary | ICD-10-CM | POA: Diagnosis not present

## 2017-06-08 MED ORDER — POTASSIUM CHLORIDE CRYS ER 20 MEQ PO TBCR: 20.0000 meq | EXTENDED_RELEASE_TABLET | Freq: Every day | ORAL | 1 refills | Status: DC

## 2017-06-09 ENCOUNTER — Ambulatory Visit (INDEPENDENT_AMBULATORY_CARE_PROVIDER_SITE_OTHER): Payer: Medicare Other

## 2017-06-09 DIAGNOSIS — I480 Paroxysmal atrial fibrillation: Secondary | ICD-10-CM

## 2017-06-10 DIAGNOSIS — A4189 Other specified sepsis: Secondary | ICD-10-CM | POA: Diagnosis not present

## 2017-06-10 DIAGNOSIS — J09X1 Influenza due to identified novel influenza A virus with pneumonia: Secondary | ICD-10-CM | POA: Diagnosis not present

## 2017-06-11 DIAGNOSIS — J09X1 Influenza due to identified novel influenza A virus with pneumonia: Secondary | ICD-10-CM | POA: Diagnosis not present

## 2017-06-11 DIAGNOSIS — A4189 Other specified sepsis: Secondary | ICD-10-CM | POA: Diagnosis not present

## 2017-06-15 DIAGNOSIS — A4189 Other specified sepsis: Secondary | ICD-10-CM | POA: Diagnosis not present

## 2017-06-15 DIAGNOSIS — J09X1 Influenza due to identified novel influenza A virus with pneumonia: Secondary | ICD-10-CM | POA: Diagnosis not present

## 2017-06-15 NOTE — Telephone Encounter (Signed)
close

## 2017-06-16 DIAGNOSIS — A4189 Other specified sepsis: Secondary | ICD-10-CM | POA: Diagnosis not present

## 2017-06-16 DIAGNOSIS — J09X1 Influenza due to identified novel influenza A virus with pneumonia: Secondary | ICD-10-CM | POA: Diagnosis not present

## 2017-06-17 ENCOUNTER — Telehealth: Payer: Self-pay | Admitting: Family Medicine

## 2017-06-17 ENCOUNTER — Telehealth: Payer: Self-pay

## 2017-06-17 DIAGNOSIS — J09X1 Influenza due to identified novel influenza A virus with pneumonia: Secondary | ICD-10-CM | POA: Diagnosis not present

## 2017-06-17 DIAGNOSIS — A4189 Other specified sepsis: Secondary | ICD-10-CM | POA: Diagnosis not present

## 2017-06-17 NOTE — Telephone Encounter (Signed)
Dr. Burchette - FYI. Thanks! 

## 2017-06-17 NOTE — Telephone Encounter (Signed)
Copied from [place]. Topic: General - Other >> Jun 17, 2017 11:04 AM Scherrie Gerlach wrote: Reason for CRM: Claiborne Billings with [name] calling to advise pt weighs 4 lbs more than yesterday.  163 lbs yesterday/  159 lbs on Tuesday Pt has no swelling , no increased SOB .

## 2017-06-17 NOTE — Telephone Encounter (Signed)
Copied from [place] [phone]. Topic: Inquiry >> Jun 17, 2017  8:36 AM Conception Chancy, NT wrote: PT is calling from [facility] due to the patient is no longer home bound she is driving and working they are discharging her.    [phone] [facility]

## 2017-06-18 NOTE — Telephone Encounter (Signed)
I left a detailed message with the information below at [facility].

## 2017-06-18 NOTE — Telephone Encounter (Signed)
Noted.  If no dyspnea and no edema would observe for now.

## 2017-07-05 ENCOUNTER — Telehealth: Payer: Self-pay | Admitting: Cardiovascular Disease

## 2017-07-05 ENCOUNTER — Telehealth: Payer: Self-pay | Admitting: Family Medicine

## 2017-07-05 MED ORDER — TEMAZEPAM 15 MG PO CAPS: 15.0000 mg | ORAL_CAPSULE | Freq: Every evening | ORAL | 0 refills | Status: DC | PRN

## 2017-07-05 NOTE — Telephone Encounter (Signed)
Rx sent 

## 2017-07-05 NOTE — Telephone Encounter (Signed)
[facility] reported patient being in Atrial Fib for 6 minutes with a rate from 130 to 140, patient went back into SR with HR of 60-70. Patient was asystematic.

## 2017-07-05 NOTE — Telephone Encounter (Signed)
Left message for patient to call back. Consulted Dr. Saunders Revel, DOD, about patient's monitor. Dr. Saunders Revel recommend patient to come in to see APP or [place] this week.

## 2017-07-05 NOTE — Telephone Encounter (Signed)
I spoke with pt. She reports history of atrial fib in [place] many years ago. Also had atrial fib when in hospital recently with flu/pneumonia.  She states she was not aware of atrial fib when monitor company call her today. I scheduled pt to see Ermalinda Barrios, PA tomorrow at 8:00.

## 2017-07-05 NOTE — Telephone Encounter (Signed)
Copied from [place]. Topic: Quick Communication - Rx Refill/Question >> Jul 05, 2017 11:46 AM Ether Griffins B wrote: Medication: temazepam (RESTORIL) 15 MG capsule  Pt hasnt slept well the past several nights and is hoping to get a refill on her medication quantity of 30. Pt states the last time she didn't get several nights of sleep it put her in AFIB   Preferred Pharmacy (with phone number or street name): [id number] - [place], [place]: Please be advised that RX refills may take up to 3 business days. We ask that you follow-up with your pharmacy.

## 2017-07-05 NOTE — Telephone Encounter (Signed)
New message   [facility] calling with EKG result

## 2017-07-06 ENCOUNTER — Other Ambulatory Visit: Payer: Self-pay | Admitting: Family Medicine

## 2017-07-06 ENCOUNTER — Encounter: Payer: Self-pay | Admitting: Physician Assistant

## 2017-07-06 ENCOUNTER — Ambulatory Visit (INDEPENDENT_AMBULATORY_CARE_PROVIDER_SITE_OTHER): Payer: Medicare Other | Admitting: Physician Assistant

## 2017-07-06 VITALS — BP 130/64 | HR 53 | Ht 64.0 in | Wt 162.8 lb

## 2017-07-06 DIAGNOSIS — N183 Chronic kidney disease, stage 3 unspecified: Secondary | ICD-10-CM

## 2017-07-06 DIAGNOSIS — E782 Mixed hyperlipidemia: Secondary | ICD-10-CM

## 2017-07-06 DIAGNOSIS — I2510 Atherosclerotic heart disease of native coronary artery without angina pectoris: Secondary | ICD-10-CM

## 2017-07-06 DIAGNOSIS — I480 Paroxysmal atrial fibrillation: Secondary | ICD-10-CM

## 2017-07-06 DIAGNOSIS — I10 Essential (primary) hypertension: Secondary | ICD-10-CM

## 2017-07-06 LAB — BASIC METABOLIC PANEL
BUN/Creatinine Ratio: 20 (ref 12–28)
BUN: 17 mg/dL (ref 8–27)
CALCIUM: 9.4 mg/dL (ref 8.7–10.3)
CO2: 26 mmol/L (ref 20–29)
Chloride: 98 mmol/L (ref 96–106)
Creatinine, Ser: 0.85 mg/dL (ref 0.57–1.00)
GFR, EST AFRICAN AMERICAN: 75 mL/min/{1.73_m2} (ref >59)
GFR, EST NON AFRICAN AMERICAN: 65 mL/min/{1.73_m2} (ref >59)
Glucose: 87 mg/dL (ref 65–99)
POTASSIUM: 4.6 mmol/L (ref 3.5–5.2)
Sodium: 139 mmol/L (ref 134–144)

## 2017-07-06 MED ORDER — ROSUVASTATIN CALCIUM 10 MG PO TABS: 10.0000 mg | ORAL_TABLET | Freq: Every day | ORAL | 3 refills | Status: DC

## 2017-07-06 MED ORDER — APIXABAN 5 MG PO TABS: 5.0000 mg | ORAL_TABLET | Freq: Two times a day (BID) | ORAL | 11 refills | Status: DC

## 2017-07-06 MED ORDER — METOPROLOL TARTRATE 75 MG PO TABS: 75.0000 mg | ORAL_TABLET | Freq: Two times a day (BID) | ORAL | 11 refills | Status: DC

## 2017-07-06 MED ORDER — TEMAZEPAM 15 MG PO CAPS: 15.0000 mg | ORAL_CAPSULE | Freq: Every evening | ORAL | 0 refills | Status: DC | PRN

## 2017-07-06 NOTE — Progress Notes (Signed)
Cardiology Office Note    Date:  07/06/2017   ID:  Betty Jackson, DOB 1935/03/18, MRN [id number]  PCP:  Eulas Post, MD  Cardiologist: [name], MD  No chief complaint on file.   History of Present Illness:  Betty Jackson is a 83 y.o. female with history of CAD status post stent to the RCA 2005 in [place], cardiac cath in 2014 patent RCA stent mild disease in the LAD and circumflex, normal filling pressures.  Echo 2014 normal LV size and function no valve issues.  No ischemia on stress Myoview 06/2014.  Hypertension, HLD, GERD, temporal arteritis.  Question of CVA in 2014 placed on Plavix but later it was felt that this could have been her temporal arteritis.  Mild carotid disease at that time.  Last saw Dr. Angelena Form 11/2016 and was doing well except for anxiety.  Patient has a history of remote atrial fibrillation in 2011 related to high-dose steroids for temporal arteritis.  She was just discharged from the hospital 05/26/17 after being admitted for influenza A, pneumonia, sepsis and respiratory failure with hypoxia.  Her beta-blocker was held and she developed atrial fib with RVR for several hours and converted spontaneously back to normal sinus rhythm.  She was noted to have severe hypokalemia and hypomagnesemia.  Frequent PACs and PVCs.  Labs work corrected.  CHA2DS2-VASc equals 4.  She had moderate left atrial enlargement on echo.  Because her short episode of A. fib in the setting of metabolic derangement anticoagulation was not started.  Recommended 30-day out patient monitor and if there is recurrence would stop Plavix and start Eliquis.    Yesterday on the monitor she had 6 minutes of atrial fibrillation a rate of 130-140 bpm and converted to normal sinus rhythm spontaneously.  Patient was asymptomatic.  Patient comes in today accompanied by her daughter.  She says she had some choking after swallowing a pill yesterday and wonders if that when she went into the atrial  fibrillation.  She also thinks she goes and it on the nights she does not sleep.  Her PCP does not want to give her sleep medications because of fall risk.  Overall she tolerates atrial fibrillation well.  CHA2DS2-VASc equals 4 for female, age and hypertension.  There has been a previous question of stroke as stated above but they feel it is due to temporal arteritis.    Past Medical History:  Diagnosis Date  . Anxiety state, unspecified 10/28/2012  . Bruises easily    d/t being on prednisone and plavix  . CAD (coronary artery disease)    a. Stent RCA in [facility];  b. Cath approx 2009 - nonobs per pt report.  . Cataract    immature on the left eye  . Chronic lower back pain    scoliosis  . CKD (chronic kidney disease) stage 3, GFR 30-59 ml/min (HCC)   . Complication of anesthesia    pt has a very high tolerance to meds  . DDD (degenerative disc disease)   . Depression   . Diverticulitis    hx of  . Diverticulosis   . Enteritis   . Giant cell arteritis ([place])   . Hemorrhoids   . History of scabies   . Hyperlipidemia    takes Lipitor daily  . Hypertension    takes Amlodipine,Losartan,Metoprolol,and HCTZ daily  . Incontinence of urine   . Insomnia    takes Restoril nightly  . Joint pain   . Migraines    "  back in my 20's; none since" (10/28/2012)  . Osteopenia   . Osteoporosis   . PAF (paroxysmal atrial fibrillation) ([place]) 2011   a. lone epidode in 2011 according to notes.  . Rheumatoid arthritis ([place])   . Scoliosis   . Sinus bradycardia    a. on chronic bb  . Temporal arteritis ([place]) 2011   a. followed @ [facility]; potential flareup 10/28/2012/notes 10/28/2012  . Urinary frequency   . Vocal cord dysfunction    "they don't operate properly" (10/28/2012)    Past Surgical History:  Procedure Laterality Date  . ABDOMINAL HYSTERECTOMY  ~ 1984   vaginally  . BACK SURGERY  7-34yr ago   X Stop  . BLADDER SUSPENSION  2001  . BREAST BIOPSY Right   . CATARACT EXTRACTION W/  INTRAOCULAR LENS IMPLANT Right ~ 08/2012  . COLONOSCOPY  01/26/2012   Procedure: COLONOSCOPY;  Surgeon: MLadene Artist MD,FACG;  [facility];  Service: Endoscopy;  Laterality: N/A;  note the EGD is possible  . CORONARY ANGIOPLASTY WITH STENT PLACEMENT  2006   X 1 stent  . EPIDURAL BLOCK INJECTION    . ESOPHAGOGASTRODUODENOSCOPY  01/26/2012   Procedure: ESOPHAGOGASTRODUODENOSCOPY (EGD);  Surgeon: MLadene Artist MD,FACG;  [facility];  Service: Endoscopy;  Laterality: N/A;  . HEMIARTHROPLASTY HIP Right 2012  . LAPAROSCOPIC CHOLECYSTECTOMY  2001  . LUMBAR FUSION  03/2013  . LUMBAR LAMINECTOMY/DECOMPRESSION MICRODISCECTOMY Right 11/04/2015   Procedure: Right Lumbar Five-Sacral One Microdiskectomy;  Surgeon: HKristeen Miss MD;  [facility];  Service: Neurosurgery;  Laterality: Right;  Right L5-S1 Microdiskectomy  . moles removed that required stiches     one on leg and one on face  . TEMPORAL ARTERY BIOPSY / LIGATION Bilateral 2011  . TONSILLECTOMY AND ADENOIDECTOMY     at age 83 . TUBAL LIGATION  ~ 1982  . X-STOP IMPLANTATION  ~ 2010   "lower back" (10/28/2012)    Current Medications: Current Meds  Medication Sig  . amLODipine (NORVASC) 5 MG tablet TAKE 1 TABLET (5 MG TOTAL) BY MOUTH DAILY.  . calcium carbonate (OS-CAL) 600 MG TABS Take 600 mg by mouth daily.  . cholecalciferol (VITAMIN D) 1000 UNITS tablet Take 1,000 Units by mouth daily.   .Marland Kitchenescitalopram (LEXAPRO) 10 MG tablet TAKE 1 TABLET BY MOUTH EVERY DAY  . furosemide (LASIX) 20 MG tablet Take 1 tablet (20 mg total) by mouth daily.  .Marland KitchenHYDROcodone-homatropine (HYCODAN) 5-1.5 MG/5ML syrup Take 1 teaspoon at bedtime as needed for severe cough  . losartan (COZAAR) 100 MG tablet TAKE 1 TABLET BY MOUTH EVERY DAY  . magnesium oxide (MAG-OX) 400 MG tablet Take 1 tablet (400 mg total) by mouth 2 (two) times daily.  . methocarbamol (ROBAXIN) 500 MG tablet Take 1 tablet (500 mg total) by mouth every 8 (eight)  hours as needed for muscle spasms.  . Metoprolol Tartrate 75 MG TABS Take 75 mg by mouth 2 (two) times daily. Take one extra tab as needed for rapid heart rate over 100.  . ondansetron (ZOFRAN ODT) 4 MG disintegrating tablet Take 1 tablet (4 mg total) by mouth every 8 (eight) hours as needed for nausea or vomiting.  . potassium chloride SA (K-DUR,KLOR-CON) 20 MEQ tablet Take 1 tablet (20 mEq total) by mouth daily.  . predniSONE (DELTASONE) 10 MG tablet 4 tablets for 3 days, 3 tablets for 3 days, 2 tablets for 3 days, 1 tablet continue  . temazepam (RESTORIL) 15 MG capsule Take 1  capsule (15 mg total) by mouth at bedtime as needed. for sleep  . vitamin C (ASCORBIC ACID) 500 MG tablet Take 1,000 mg by mouth daily.   . vitamin E (VITAMIN E) 400 UNIT capsule Take 400 Units by mouth daily.  . [DISCONTINUED] clopidogrel (PLAVIX) 75 MG tablet TAKE 1 TABLET BY MOUTH EVERY DAY WITH BREAKFAST  . [DISCONTINUED] Metoprolol Tartrate 75 MG TABS Take 75 mg by mouth 2 (two) times daily.   Current Facility-Administered Medications for the 07/06/17 encounter (Office Visit) with Imogene Burn, PA-C  Medication  . 0.9 %  sodium chloride infusion     Allergies:   Dextromethorphan   Social History   Socioeconomic History  . Marital status: Married    Spouse name: Not on file  . Number of children: 3  . Years of education: Not on file  . Highest education level: Not on file  Occupational History  . Occupation: Retired Development worker, community  . Financial resource strain: Not on file  . Food insecurity:    Worry: Not on file    Inability: Not on file  . Transportation needs:    Medical: Not on file    Non-medical: Not on file  Tobacco Use  . Smoking status: Never Smoker  . Smokeless tobacco: Never Used  Substance and Sexual Activity  . Alcohol use: Yes    Alcohol/week: 0.0 oz    Comment: socially  . Drug use: No  . Sexual activity: Not Currently    Birth control/protection: Surgical  Lifestyle    . Physical activity:    Days per week: Not on file    Minutes per session: Not on file  . Stress: Not on file  Relationships  . Social connections:    Talks on phone: Not on file    Gets together: Not on file    Attends religious service: Not on file    Active member of club or organization: Not on file    Attends meetings of clubs or organizations: Not on file    Relationship status: Not on file  Other Topics Concern  . Not on file  Social History Narrative   Lives in [place] with her husband.  Retired Cabin crew.     Family History:  The patient's family history includes Brain cancer (age of onset: 67) in her mother; Breast cancer (age of onset: 55) in her daughter; Heart attack in her father; Heart disease in her other; Hypertension in her other.   ROS:   Please see the history of present illness.    Review of Systems  Constitution: Negative.  HENT: Negative.   Eyes: Negative.   Cardiovascular: Positive for irregular heartbeat.  Respiratory: Negative.   Hematologic/Lymphatic: Negative.   Musculoskeletal: Positive for arthritis and muscle weakness. Negative for joint pain.  Gastrointestinal: Negative.   Genitourinary: Negative.   Neurological: Negative.    All other systems reviewed and are negative.   PHYSICAL EXAM:   VS:  BP 130/64   Pulse (!) 53   Ht 5' 4" (1.626 m)   Wt 162 lb 12.8 oz (73.8 kg)   SpO2 94%   BMI 27.94 kg/m   Physical Exam  GEN: Young looking well nourished, well developed, in no acute distress  Neck: no JVD, carotid bruits, or masses Cardiac:RRR; no murmurs, rubs, or gallops  Respiratory:  clear to auscultation bilaterally, normal work of breathing GI: soft, nontender, nondistended, + BS Ext: without cyanosis, clubbing, or edema, Good distal pulses bilaterally Neuro:  Alert and Oriented x 3, Psych: euthymic mood, full affect  Wt Readings from Last 3 Encounters:  07/06/17 162 lb 12.8 oz (73.8 kg)  06/07/17 162 lb (73.5 kg)  05/26/17 176  lb 9.4 oz (80.1 kg)      Studies/Labs Reviewed:   EKG:  EKG is not ordered today.  Recent Labs: 05/21/2017: Hemoglobin 11.6; Platelets 153 05/25/2017: TSH 0.302 06/07/2017: ALT 22; BUN 15; Creatinine, Ser 0.82; Magnesium 2.1; Potassium 5.3; Sodium 136   Lipid Panel    Component Value Date/Time   CHOL 148 11/13/2013 1403   TRIG 65.0 11/13/2013 1403   HDL 68.70 11/13/2013 1403   CHOLHDL 2 11/13/2013 1403   VLDL 13.0 11/13/2013 1403   LDLCALC 66 11/13/2013 1403    Additional studies/ records that were reviewed today include:  2D echo 2/26/19Study Conclusions   - Left ventricle: The cavity size was normal. Systolic function was   normal. The estimated ejection fraction was in the range of 60%   to 65%. Wall motion was normal; there were no regional wall   motion abnormalities. Features are consistent with a pseudonormal   left ventricular filling pattern, with concomitant abnormal   relaxation and increased filling pressure (grade 2 diastolic   dysfunction). - Aortic valve: There was mild regurgitation. - Mitral valve: Calcified annulus. - Left atrium: The atrium was moderately dilated. - Pulmonary arteries: PA peak pressure: 31 mm Hg (S). - Pericardium, extracardiac: There was a left pleural effusion.   Cardiac cath 12/09/12: Ao: 150/61  LV: 155/13/18  RA: 6  RV: 25/5/8  PA: 24/10 (mean 16)  PCWP: 9  Fick Cardiac Output: 3.9 L/min  Fick Cardiac Index: 2.3 L/min/m2  Central Aortic Saturation: 98%  Pulmonary Artery Saturation: 69%  Angiographic Findings:  Left main: 10% distal stenosis.  Left Anterior Descending Artery: Large caliber vessel that courses to the apex. The proximal and mid vessel has moderate calcification. The mid vessel has a long 20% stenosis. There are two small caliber diagonal branches with mild plaque disease.  Circumflex Artery: Large caliber vessel with two small caliber obtuse marginal branches. No obstructive disease.  Right Coronary Artery: Large  caliber, dominant vessel with a patent stent in the mid vessel. There is minimal stent restenosis. Just beyond the stent there is a 20% stenosis.  Left Ventricular Angiogram: LVEF=65%  Impression:  1. Stable single vessel CAD with patent mid RCA stent  2. Normal LV systolic function  3. Normal PA pressures   Echo August 2014: Left ventricle: The cavity size was normal. Wall thickness was normal. Systolic function was vigorous. The estimated ejection fraction was in the range of 65% to 70%. Wall motion was normal; there were no regional wall motion abnormalities. - Pulmonary arteries: PA peak pressure: 56m Hg (S).       ASSESSMENT:    1. PAF (paroxysmal atrial fibrillation) ([place]   2. Coronary artery disease involving native coronary artery of native heart without angina pectoris   3. Essential hypertension   4. CKD (chronic kidney disease) stage 3, GFR 30-59 ml/min (HCC)   5. Mixed hyperlipidemia      PLAN:  In order of problems listed above:  PAF dating back to 2011, most recently while in the hospital in the setting of sepsis and metabolic derangement converted spontaneously back to normal sinus rhythm.  Patient had a 6-minute episode of rapid atrial fibrillation on 30-day monitor yesterday.  She was asymptomatic.  CHA2DS2-VASc equals 4 and recommend Eliquis.  Renal function normal  throughout hospitalization.  We will repeat today and recommend 5 mg twice daily.  Follow-up be met in 6 weeks.  Reluctant to increase metoprolol further at this time.  I told her she could take an extra 25 mg if needed for rapid heart rates.  CAD status post remote DES to the RCA 2005 in [place], cath 2014 patent RCA stent, nonobstructive disease elsewhere, LVEF 65% negative stress Myoview 2016  Essential hypertension blood pressure stable  CKD stage III although creatinine was 0.82 06/07/17 and I do not see significant reduction in GFR.  Mixed hyperlipidemia no longer taking Lipitor and refused  last office visit.  Patient willing to try Crestor.  Will give 10 mg daily and check fasting lipid panel and LFTs in 6 weeks.    Medication Adjustments/Labs and Tests Ordered: Current medicines are reviewed at length with the patient today.  Concerns regarding medicines are outlined above.  Medication changes, Labs and Tests ordered today are listed in the Patient Instructions below. Patient Instructions  Medication Instructions:  Your physician has recommended you make the following change in your medication:  1- STOP Plavix 2-START Eliquis 5 mg by mouth twice daily 3-START Crestor 10 mg by mouth daily 4-TAKE an extra metoprolol by mouth as needed for rapid heart rate  Labwork: Your physician recommends that you have lab work today- BMET Your physician recommends that you return for lab work in: 6 weeks for fasting lipid and liver panel.  Testing/Procedures: NONE  Follow-Up: Your physician wants you to follow-up in: 6 weeks with Dr. Angelena Form.   If you need a refill on your cardiac medications before your next appointment, please call your pharmacy.       Signed, Ermalinda Barrios, PA-C  07/06/2017 9:10 AM    [facility] [address], [place], [place]  [address] Phone: [phone]; Fax: [phone]

## 2017-07-06 NOTE — Telephone Encounter (Signed)
Patient calling because temazepam (RESTORIL) 15 MG capsule is not at her pharmacy even though it was sent yesterday. She would like a call back once this is resolved.

## 2017-07-06 NOTE — Addendum Note (Signed)
Addended by: Westley Hummer B on: 07/06/2017 05:47 PM   Modules accepted: Orders

## 2017-07-06 NOTE — Telephone Encounter (Signed)
Pt needs to have this medication frilled - she is not sleeping and she need them.   [phone] home is  [phone]

## 2017-07-06 NOTE — Telephone Encounter (Signed)
Rx called in 

## 2017-07-06 NOTE — Patient Instructions (Addendum)
Medication Instructions:  Your physician has recommended you make the following change in your medication:  1- STOP Plavix 2-START Eliquis 5 mg by mouth twice daily 3-START Crestor 10 mg by mouth daily 4-TAKE an extra metoprolol by mouth as needed for rapid heart rate  Labwork: Your physician recommends that you have lab work today- BMET Your physician recommends that you return for lab work in: 6 weeks for fasting lipid and liver panel.  Testing/Procedures: NONE  Follow-Up: Your physician wants you to follow-up in: 6 weeks with Dr. Angelena Form.   If you need a refill on your cardiac medications before your next appointment, please call your pharmacy.

## 2017-07-07 ENCOUNTER — Other Ambulatory Visit: Payer: Self-pay

## 2017-07-07 MED ORDER — METOPROLOL TARTRATE 50 MG PO TABS: 75.0000 mg | ORAL_TABLET | Freq: Two times a day (BID) | ORAL | 11 refills | Status: DC

## 2017-07-07 NOTE — Progress Notes (Signed)
Pharmacy does not carry Metoprolol tartrate 75 mg tablet. Changed to metoprolol tartrate 50 mg 1 1/2 tablets by mouth twice daily.

## 2017-07-09 ENCOUNTER — Telehealth: Payer: Self-pay | Admitting: Physician Assistant

## 2017-07-09 NOTE — Progress Notes (Signed)
Pt has been made aware of normal result and verbalized understanding.  jw 07/09/17

## 2017-07-09 NOTE — Telephone Encounter (Signed)
New Message   Patient is returning call about labs. Please call to discuss.

## 2017-07-09 NOTE — Telephone Encounter (Signed)
-----   Message from Imogene Burn, PA-C sent at 07/07/2017  7:49 AM EDT ----- Renal function normal continue current treatment.

## 2017-07-09 NOTE — Telephone Encounter (Signed)
Returned pts call.  Discussed lab results. Pt also reports l & r foot swelling, left worse than the right. Pt denies cp, rash, sob, and any other symptoms. Per Ellen Henri, PA-C Hurley Cisco out of the office) Instructed pt to increase Lasix to 40 mg qd X's 2 days then back to 20 mg daily and to call our office on Monday to report how she was doing. Pt thanked me for the assistance and verbalized understanding.

## 2017-07-15 DIAGNOSIS — M315 Giant cell arteritis with polymyalgia rheumatica: Secondary | ICD-10-CM | POA: Diagnosis not present

## 2017-07-15 DIAGNOSIS — Z961 Presence of intraocular lens: Secondary | ICD-10-CM | POA: Diagnosis not present

## 2017-07-15 DIAGNOSIS — H26491 Other secondary cataract, right eye: Secondary | ICD-10-CM | POA: Diagnosis not present

## 2017-07-19 ENCOUNTER — Other Ambulatory Visit: Payer: Self-pay | Admitting: Family Medicine

## 2017-07-22 ENCOUNTER — Other Ambulatory Visit: Payer: Self-pay | Admitting: Family Medicine

## 2017-07-23 ENCOUNTER — Other Ambulatory Visit (INDEPENDENT_AMBULATORY_CARE_PROVIDER_SITE_OTHER): Payer: Medicare Other

## 2017-07-23 ENCOUNTER — Other Ambulatory Visit: Payer: Self-pay | Admitting: Family Medicine

## 2017-07-23 DIAGNOSIS — M316 Other giant cell arteritis: Secondary | ICD-10-CM

## 2017-07-23 LAB — SEDIMENTATION RATE: SED RATE: 13 mm/h (ref 0–30)

## 2017-07-23 LAB — C-REACTIVE PROTEIN: CRP: 0.7 mg/dL (ref 0.5–20.0)

## 2017-07-30 ENCOUNTER — Ambulatory Visit (INDEPENDENT_AMBULATORY_CARE_PROVIDER_SITE_OTHER): Payer: Medicare Other | Admitting: Family Medicine

## 2017-07-30 ENCOUNTER — Telehealth: Payer: Self-pay | Admitting: *Deleted

## 2017-07-30 ENCOUNTER — Encounter: Payer: Self-pay | Admitting: Family Medicine

## 2017-07-30 VITALS — BP 124/60 | HR 54 | Temp 98.0°F | Ht 64.0 in | Wt 162.2 lb

## 2017-07-30 DIAGNOSIS — R600 Localized edema: Secondary | ICD-10-CM

## 2017-07-30 DIAGNOSIS — I2510 Atherosclerotic heart disease of native coronary artery without angina pectoris: Secondary | ICD-10-CM

## 2017-07-30 MED ORDER — TEMAZEPAM 15 MG PO CAPS: 15.0000 mg | ORAL_CAPSULE | Freq: Every evening | ORAL | 0 refills | Status: DC | PRN

## 2017-07-30 NOTE — Telephone Encounter (Signed)
We've discussed in past.  Want her to avoid regular use.  Refill once.

## 2017-07-30 NOTE — Telephone Encounter (Signed)
Patient in the office today for an office visit with Dr Volanda Napoleon.  Patient requests a refill:  temazepam (RESTORIL) 15 MG capsule  Last refill 07/06/17  #20  Okay to refill?

## 2017-07-30 NOTE — Progress Notes (Signed)
Subjective:    Patient ID: Betty Jackson, female    DOB: 08/17/1934, 83 y.o.   MRN: [id number]  Chief Complaint  Patient presents with  . Edema    patient complains of bilateral lower extremity swelling xweeks ago after beginning Eliquis given by Dr Angelena Form.  States she was advised to increase Lasix to two pills (40mg  a day)    HPI Patient was seen today for acute concern.  Pt endorses ongoing lower extremity edema.  Pt states she was traveling out of town and returned yesterday.  Yesterday evening she noticed that her feet and ankles were extremely swollen.  Pt endorses improvement in her swelling today.  Pt notes the swelling typically becomes worse in the evenings.  Pt denies an increase in her sodium intake for prolonged sitting or standing.  Pt states she tries to elevate her feet while at home.  Pt also notes she increased her Lasix to 40 mg daily per cardiology recommendation for lower extremity edema.  Pt states she has a cardiology and lab appointment next week.  Pt denies shortness of breath, inability to lay flat at night, CP, or cough.  Past Medical History:  Diagnosis Date  . Anxiety state, unspecified 10/28/2012  . Bruises easily    d/t being on prednisone and plavix  . CAD (coronary artery disease)    a. Stent RCA in [facility];  b. Cath approx 2009 - nonobs per pt report.  . Cataract    immature on the left eye  . Chronic lower back pain    scoliosis  . CKD (chronic kidney disease) stage 3, GFR 30-59 ml/min (HCC)   . Complication of anesthesia    pt has a very high tolerance to meds  . DDD (degenerative disc disease)   . Depression   . Diverticulitis    hx of  . Diverticulosis   . Enteritis   . Giant cell arteritis ([place])   . Hemorrhoids   . History of scabies   . Hyperlipidemia    takes Lipitor daily  . Hypertension    takes Amlodipine,Losartan,Metoprolol,and HCTZ daily  . Incontinence of urine   . Insomnia    takes Restoril nightly  . Joint pain   .  Migraines    "back in my 20's; none since" (10/28/2012)  . Osteopenia   . Osteoporosis   . PAF (paroxysmal atrial fibrillation) ([place]) 2011   a. lone epidode in 2011 according to notes.  . Rheumatoid arthritis ([place])   . Scoliosis   . Sinus bradycardia    a. on chronic bb  . Temporal arteritis ([place]) 2011   a. followed @ [facility]; potential flareup 10/28/2012/notes 10/28/2012  . Urinary frequency   . Vocal cord dysfunction    "they don't operate properly" (10/28/2012)    Allergies  Allergen Reactions  . Dextromethorphan Rash    ROS General: Denies fever, chills, night sweats, changes in weight, changes in appetite HEENT: Denies headaches, ear pain, changes in vision, rhinorrhea, sore throat CV: Denies CP, palpitations, SOB, orthopnea Pulm: Denies SOB, cough, wheezing GI: Denies abdominal pain, nausea, vomiting, diarrhea, constipation GU: Denies dysuria, hematuria, frequency, vaginal discharge Msk: Denies muscle cramps, joint pains + b/l ankle edema Neuro: Denies weakness, numbness, tingling Skin: Denies rashes, bruising Psych: Denies depression, anxiety, hallucinations     Objective:    Blood pressure 124/60, pulse (!) 54, temperature 98 F (36.7 C), temperature source Oral, height 5\' 4"  (1.626 m), weight 162 lb 3.2 oz (73.6 kg),  SpO2 96 %.   Gen. Pleasant, well-nourished, in no distress, normal affect   HEENT: Colon/AT, face symmetric, no scleral icterus, PERRLA, nares patent without drainage Lungs: no accessory muscle use, CTAB, no wheezes or rales Cardiovascular: RRR, no m/r/g, trace edema in bilateral ankles Neuro:  A&Ox3, CN II-XII intact, normal gait Skin:  Warm, no lesions/ rash   Wt Readings from Last 3 Encounters:  07/30/17 162 lb 3.2 oz (73.6 kg)  07/06/17 162 lb 12.8 oz (73.8 kg)  06/07/17 162 lb (73.5 kg)    Lab Results  Component Value Date   WBC 10.2 05/21/2017   HGB 11.6 (L) 05/21/2017   HCT 36.5 05/21/2017   PLT 153 05/21/2017   GLUCOSE 87 07/06/2017   CHOL  148 11/13/2013   TRIG 65.0 11/13/2013   HDL 68.70 11/13/2013   LDLCALC 66 11/13/2013   ALT 22 06/07/2017   AST 17 06/07/2017   NA 139 07/06/2017   K 4.6 07/06/2017   CL 98 07/06/2017   CREATININE 0.85 07/06/2017   BUN 17 07/06/2017   CO2 26 07/06/2017   TSH 0.302 (L) 05/25/2017   INR 1.07 10/28/2012   HGBA1C 5.8 03/31/2017    Assessment/Plan:  Bilateral lower extremity edema  -advised to continue Lasix 20 mg twice daily -advised to continue elevating lower extremities when resting. -advised to decrease sodium intake -given Rx for TED hose -Patient to follow-up with PCP and cardiology in the next few weeks.  Grier Mitts, MD

## 2017-07-30 NOTE — Patient Instructions (Signed)
Peripheral Edema Peripheral edema is swelling that is caused by a buildup of fluid. Peripheral edema most often affects the lower legs, ankles, and feet. It can also develop in the arms, hands, and face. The area of the body that has peripheral edema will look swollen. It may also feel heavy or warm. Your clothes may start to feel tight. Pressing on the area may make a temporary dent in your skin. You may not be able to move your arm or leg as much as usual. There are many causes of peripheral edema. It can be a complication of other diseases, such as congestive heart failure, kidney disease, or a problem with your blood circulation. It also can be a side effect of certain medicines. It often happens to women during pregnancy. Sometimes, the cause is not known. Treating the underlying condition is often the only treatment for peripheral edema. Follow these instructions at home: Pay attention to any changes in your symptoms. Take these actions to help with your discomfort:  Raise (elevate) your legs while you are sitting or lying down.  Move around often to prevent stiffness and to lessen swelling. Do not sit or stand for long periods of time.  Wear support stockings as told by your health care provider.  Follow instructions from your health care provider about limiting salt (sodium) in your diet. Sometimes eating less salt can reduce swelling.  Take over-the-counter and prescription medicines only as told by your health care provider. Your health care provider may prescribe medicine to help your body get rid of excess water (diuretic).  Keep all follow-up visits as told by your health care provider. This is important.  Contact a health care provider if:  You have a fever.  Your edema starts suddenly or is getting worse, especially if you are pregnant or have a medical condition.  You have swelling in only one leg.  You have increased swelling and pain in your legs. Get help right away  if:  You develop shortness of breath, especially when you are lying down.  You have pain in your chest or abdomen.  You feel weak.  You faint. This information is not intended to replace advice given to you by your health care provider. Make sure you discuss any questions you have with your health care provider. Document Released: 04/23/2004 Document Revised: 08/19/2015 Document Reviewed: 09/26/2014 Elsevier Interactive Patient Education  2018 [employer].  How to Use Compression Stockings Compression stockings are elastic socks that squeeze the legs. They help to increase blood flow to the legs, decrease swelling in the legs, and reduce the chance of developing blood clots in the lower legs. Compression stockings are often used by people who:  Are recovering from surgery.  Have poor circulation in their legs.  Are prone to getting blood clots in their legs.  Have varicose veins.  Sit or stay in bed for long periods of time.  How to use compression stockings Before you put on your compression stockings:  Make sure that they are the correct size. If you do not know your size, ask your health care provider.  Make sure that they are clean, dry, and in good condition.  Check them for rips and tears. Do not put them on if they are ripped or torn.  Put your stockings on first thing in the morning, before you get out of bed. Keep them on for as long as your health care provider advises. When you are wearing your stockings:  Keep  them as smooth as possible. Do not allow them to bunch up. It is especially important to prevent the stockings from bunching up around your toes or behind your knees.  Do not roll the stockings downward and leave them rolled down. This can decrease blood flow to your leg.  Change them right away if they become wet or dirty.  When you take off your stockings, inspect your legs and feet. Anything that does not seem normal may require medical attention.  Look for:  Open sores.  Red spots.  Swelling.  Information and tips  Do not stop wearing your compression stockings without talking to your health care provider first.  Wash your stockings every day with mild detergent in cold or warm water. Do not use bleach. Air-dry your stockings or dry them in a clothes dryer on low heat.  Replace your stockings every 3-6 months.  If skin moisturizing is part of your treatment plan, apply lotion or cream at night so that your skin will be dry when you put on the stockings in the morning. It is harder to put the stockings on when you have lotion on your legs or feet. Contact a health care provider if: Remove your stockings and seek medical care if:  You have a feeling of pins and needles in your feet or legs.  You have any new changes in your skin.  You have skin lesions that are getting worse.  You have swelling or pain that is getting worse.  Get help right away if:  You have numbness or tingling in your lower legs that does not get better right after you take the stockings off.  Your toes or feet become cold and blue.  You develop open sores or red spots on your legs that do not go away.  You see or feel a warm spot on your leg.  You have new swelling or soreness in your leg.  You are short of breath or you have chest pain for no reason.  You have a rapid or irregular heartbeat.  You feel light-headed or dizzy. This information is not intended to replace advice given to you by your health care provider. Make sure you discuss any questions you have with your health care provider. Document Released: 01/11/2009 Document Revised: 08/14/2015 Document Reviewed: 02/21/2014 Elsevier Interactive Patient Education  [employer].

## 2017-07-30 NOTE — Telephone Encounter (Signed)
Rx called in 

## 2017-08-11 DIAGNOSIS — M545 Low back pain: Secondary | ICD-10-CM | POA: Diagnosis not present

## 2017-08-11 DIAGNOSIS — Z79899 Other long term (current) drug therapy: Secondary | ICD-10-CM | POA: Diagnosis not present

## 2017-08-11 DIAGNOSIS — M316 Other giant cell arteritis: Secondary | ICD-10-CM | POA: Diagnosis not present

## 2017-08-17 ENCOUNTER — Other Ambulatory Visit: Payer: Medicare Other

## 2017-08-18 ENCOUNTER — Other Ambulatory Visit: Payer: Medicare Other

## 2017-08-18 ENCOUNTER — Other Ambulatory Visit: Payer: Self-pay | Admitting: Physician Assistant

## 2017-08-18 ENCOUNTER — Other Ambulatory Visit: Payer: Medicare Other | Admitting: *Deleted

## 2017-08-18 DIAGNOSIS — N183 Chronic kidney disease, stage 3 (moderate): Secondary | ICD-10-CM | POA: Diagnosis not present

## 2017-08-18 DIAGNOSIS — I480 Paroxysmal atrial fibrillation: Secondary | ICD-10-CM | POA: Diagnosis not present

## 2017-08-18 DIAGNOSIS — E782 Mixed hyperlipidemia: Secondary | ICD-10-CM | POA: Diagnosis not present

## 2017-08-18 DIAGNOSIS — I10 Essential (primary) hypertension: Secondary | ICD-10-CM | POA: Diagnosis not present

## 2017-08-18 DIAGNOSIS — I2510 Atherosclerotic heart disease of native coronary artery without angina pectoris: Secondary | ICD-10-CM | POA: Diagnosis not present

## 2017-08-19 LAB — LIPID PANEL
CHOLESTEROL TOTAL: 155 mg/dL (ref 100–199)
Chol/HDL Ratio: 2 ratio (ref 0.0–4.4)
HDL: 78 mg/dL (ref >39)
LDL CALC: 51 mg/dL (ref 0–99)
Triglycerides: 130 mg/dL (ref 0–149)
VLDL CHOLESTEROL CAL: 26 mg/dL (ref 5–40)

## 2017-08-19 LAB — HEPATIC FUNCTION PANEL
ALBUMIN: 4.3 g/dL (ref 3.5–4.7)
ALT: 31 IU/L (ref 0–32)
AST: 25 IU/L (ref 0–40)
Alkaline Phosphatase: 81 IU/L (ref 39–117)
Bilirubin Total: 0.7 mg/dL (ref 0.0–1.2)
Bilirubin, Direct: 0.2 mg/dL (ref 0.00–0.40)
Total Protein: 6.2 g/dL (ref 6.0–8.5)

## 2017-08-19 NOTE — Progress Notes (Signed)
Pt has been made aware of normal result and verbalized understanding.  jw 08/19/17

## 2017-08-25 ENCOUNTER — Encounter: Payer: Self-pay | Admitting: Cardiovascular Disease

## 2017-08-25 ENCOUNTER — Encounter: Payer: Self-pay | Admitting: *Deleted

## 2017-08-25 ENCOUNTER — Ambulatory Visit (INDEPENDENT_AMBULATORY_CARE_PROVIDER_SITE_OTHER): Payer: Medicare Other | Admitting: Cardiovascular Disease

## 2017-08-25 VITALS — BP 118/70 | HR 58 | Ht 64.0 in | Wt 166.8 lb

## 2017-08-25 DIAGNOSIS — I25118 Atherosclerotic heart disease of native coronary artery with other forms of angina pectoris: Secondary | ICD-10-CM

## 2017-08-25 DIAGNOSIS — I480 Paroxysmal atrial fibrillation: Secondary | ICD-10-CM | POA: Diagnosis not present

## 2017-08-25 DIAGNOSIS — R040 Epistaxis: Secondary | ICD-10-CM

## 2017-08-25 DIAGNOSIS — E782 Mixed hyperlipidemia: Secondary | ICD-10-CM | POA: Diagnosis not present

## 2017-08-25 DIAGNOSIS — I10 Essential (primary) hypertension: Secondary | ICD-10-CM

## 2017-08-25 DIAGNOSIS — I2510 Atherosclerotic heart disease of native coronary artery without angina pectoris: Secondary | ICD-10-CM

## 2017-08-25 NOTE — Progress Notes (Signed)
Chief Complaint  Patient presents with  . Follow-up    CAD   History of Present Illness: 83 yo female with history of PAF, PVCs, PACs, CAD s/p stent RCA, HTN, HLD, GERD, temporal arteritis here today for cardiac follow up. She had a stent placed in the RCA in [place], [place] in 2005. She moved to [facility] for treatment of her temporal arteritis at [facility]. She had normal ABI in 2012. She had a possible CVA in 2014 and was placed on Plavix but later it was felt that this could have been due to her temporal arteritis. Mild carotid disease by dopplers 2014. Cardiac cath 12/09/12 with patent RCA stent, mild disease LAD and Circumflex, normal filling pressures. Echo August 2014 with normal LV size and function, no significant valve issues. She was seen in our office 06/19/14 with c/o chest pain. Stress myoview 06/29/14 with no ischemia. She was admitted to [facility] in February 2019 with influenza A, pneumonia and sepsis. Her beta blocker was held and she developed atrial fibrillation with RVR. She also had frequent PVCs and PACs. She converted back to sinus after several hours. Echo February 2019 with normal LV size and function, grade 2 diastolic dysfunction, mild AI. She was not discharged on anticoagulation. She wore a cardiac monitor and had recurrent atrial fib. CHADS VASC score 4. She was seen by Estella Husk, PA 07/06/17 and she was started on Eliquis.   She is here today for follow up. The patient denies any dyspnea, palpitations, orthopnea, PND, dizziness, near syncope or syncope. She has had lower extremity edema for the past few months. This has been controlled with Lasix. She has had minor nosebleeds four days per week since starting the Eliquis. She has had some resting chest pain over the past few weeks. It is worsened with exertion. It resolves with rest.   Primary Care Physician: Eulas Post, MD   Past Medical History:  Diagnosis Date  . Anxiety state, unspecified 10/28/2012  . Bruises easily    d/t  being on prednisone and plavix  . CAD (coronary artery disease)    a. Stent RCA in [facility];  b. Cath approx 2009 - nonobs per pt report.  . Cataract    immature on the left eye  . Chronic lower back pain    scoliosis  . CKD (chronic kidney disease) stage 3, GFR 30-59 ml/min (HCC)   . Complication of anesthesia    pt has a very high tolerance to meds  . DDD (degenerative disc disease)   . Depression   . Diverticulitis    hx of  . Diverticulosis   . Enteritis   . Giant cell arteritis ([place])   . Hemorrhoids   . History of scabies   . Hyperlipidemia    takes Lipitor daily  . Hypertension    takes Amlodipine,Losartan,Metoprolol,and HCTZ daily  . Incontinence of urine   . Insomnia    takes Restoril nightly  . Joint pain   . Migraines    "back in my 20's; none since" (10/28/2012)  . Osteopenia   . Osteoporosis   . PAF (paroxysmal atrial fibrillation) ([place]) 2011   a. lone epidode in 2011 according to notes.  . Rheumatoid arthritis ([place])   . Scoliosis   . Sinus bradycardia    a. on chronic bb  . Temporal arteritis ([place]) 2011   a. followed @ [facility]; potential flareup 10/28/2012/notes 10/28/2012  . Urinary frequency   . Vocal cord dysfunction    "  they don't operate properly" (10/28/2012)    Past Surgical History:  Procedure Laterality Date  . ABDOMINAL HYSTERECTOMY  ~ 1984   vaginally  . BACK SURGERY  7-5yrs ago   X Stop  . BLADDER SUSPENSION  2001  . BREAST BIOPSY Right   . CATARACT EXTRACTION W/ INTRAOCULAR LENS IMPLANT Right ~ 08/2012  . COLONOSCOPY  01/26/2012   Procedure: COLONOSCOPY;  Surgeon: Ladene Artist, MD,FACG;  Location: [facility] ENDOSCOPY;  Service: Endoscopy;  Laterality: N/A;  note the EGD is possible  . CORONARY ANGIOPLASTY WITH STENT PLACEMENT  2006   X 1 stent  . EPIDURAL BLOCK INJECTION    . ESOPHAGOGASTRODUODENOSCOPY  01/26/2012   Procedure: ESOPHAGOGASTRODUODENOSCOPY (EGD);  Surgeon: Ladene Artist, MD,FACG;  Location: [facility] ENDOSCOPY;  Service:  Endoscopy;  Laterality: N/A;  . HEMIARTHROPLASTY HIP Right 2012  . LAPAROSCOPIC CHOLECYSTECTOMY  2001  . LUMBAR FUSION  03/2013  . LUMBAR LAMINECTOMY/DECOMPRESSION MICRODISCECTOMY Right 11/04/2015   Procedure: Right Lumbar Five-Sacral One Microdiskectomy;  Surgeon: Kristeen Miss, MD;  Location: [facility] NEURO ORS;  Service: Neurosurgery;  Laterality: Right;  Right L5-S1 Microdiskectomy  . moles removed that required stiches     one on leg and one on face  . TEMPORAL ARTERY BIOPSY / LIGATION Bilateral 2011  . TONSILLECTOMY AND ADENOIDECTOMY     at age 53  . TUBAL LIGATION  ~ 1982  . X-STOP IMPLANTATION  ~ 2010   "lower back" (10/28/2012)    Current Outpatient Medications  Medication Sig Dispense Refill  . amLODipine (NORVASC) 5 MG tablet TAKE 1 TABLET (5 MG TOTAL) BY MOUTH DAILY. 90 tablet 2  . apixaban (ELIQUIS) 5 MG TABS tablet Take 1 tablet (5 mg total) by mouth 2 (two) times daily. 60 tablet 11  . calcium carbonate (OS-CAL) 600 MG TABS Take 600 mg by mouth daily.    . cholecalciferol (VITAMIN D) 1000 UNITS tablet Take 1,000 Units by mouth daily.     Marland Kitchen escitalopram (LEXAPRO) 10 MG tablet TAKE 1 TABLET BY MOUTH EVERY DAY 90 tablet 0  . furosemide (LASIX) 20 MG tablet Take 20 mg by mouth 2 (two) times daily.    Marland Kitchen losartan (COZAAR) 100 MG tablet TAKE 1 TABLET BY MOUTH EVERY DAY 90 tablet 3  . magnesium oxide (MAG-OX) 400 MG tablet Take 1 tablet (400 mg total) by mouth 2 (two) times daily. 60 tablet 2  . metoprolol tartrate (LOPRESSOR) 50 MG tablet Take 1.5 tablets (75 mg total) by mouth 2 (two) times daily. Take one extra tab as needed for rapid heart rate over 100. 90 tablet 11  . potassium chloride SA (K-DUR,KLOR-CON) 20 MEQ tablet Take 1 tablet (20 mEq total) by mouth daily. 90 tablet 1  . predniSONE (DELTASONE) 10 MG tablet 4 tablets for 3 days, 3 tablets for 3 days, 2 tablets for 3 days, 1 tablet continue 90 tablet 1  . rosuvastatin (CRESTOR) 10 MG tablet Take 1 tablet (10 mg total) by mouth  daily. 90 tablet 3  . temazepam (RESTORIL) 15 MG capsule Take 1 capsule (15 mg total) by mouth at bedtime as needed. for sleep 20 capsule 0  . vitamin C (ASCORBIC ACID) 500 MG tablet Take 1,000 mg by mouth daily.     . vitamin E (VITAMIN E) 400 UNIT capsule Take 400 Units by mouth daily.     Current Facility-Administered Medications  Medication Dose Route Frequency Provider Last Rate Last Dose  . 0.9 %  sodium chloride infusion   Intravenous Once  Burchette, Alinda Sierras, MD        Allergies  Allergen Reactions  . Dextromethorphan Rash    Social History   Socioeconomic History  . Marital status: Married    Spouse name: Not on file  . Number of children: 3  . Years of education: Not on file  . Highest education level: Not on file  Occupational History  . Occupation: Retired Development worker, community  . Financial resource strain: Not on file  . Food insecurity:    Worry: Not on file    Inability: Not on file  . Transportation needs:    Medical: Not on file    Non-medical: Not on file  Tobacco Use  . Smoking status: Never Smoker  . Smokeless tobacco: Never Used  Substance and Sexual Activity  . Alcohol use: Yes    Alcohol/week: 0.0 oz    Comment: socially  . Drug use: No  . Sexual activity: Not Currently    Birth control/protection: Surgical  Lifestyle  . Physical activity:    Days per week: Not on file    Minutes per session: Not on file  . Stress: Not on file  Relationships  . Social connections:    Talks on phone: Not on file    Gets together: Not on file    Attends religious service: Not on file    Active member of club or organization: Not on file    Attends meetings of clubs or organizations: Not on file    Relationship status: Not on file  . Intimate partner violence:    Fear of current or ex partner: Not on file    Emotionally abused: Not on file    Physically abused: Not on file    Forced sexual activity: Not on file  Other Topics Concern  . Not on file    Social History Narrative   Lives in [place] with her husband.  Retired Cabin crew.    Family History  Problem Relation Age of Onset  . Brain cancer Mother 104  . Heart attack Father   . Breast cancer Daughter 81  . Heart disease Other   . Hypertension Other   . Arthritis Neg Hx   . Colon cancer Neg Hx   . Osteoporosis Neg Hx     Review of Systems:  As stated in the HPI and otherwise negative.   BP 118/70   Pulse (!) 58   Ht 5\' 4"  (1.626 m)   Wt 166 lb 12.8 oz (75.7 kg)   SpO2 96%   BMI 28.63 kg/m   Physical Examination:  General: Well developed, well nourished, NAD  HEENT: OP clear, mucus membranes moist  SKIN: warm, dry. No rashes. Neuro: No focal deficits  Musculoskeletal: Muscle strength 5/5 all ext  Psychiatric: Mood and affect normal  Neck: No JVD, no carotid bruits, no thyromegaly, no lymphadenopathy.  Lungs:Clear bilaterally, no wheezes, rhonci, crackles Cardiovascular: Regular rate and rhythm. No murmurs, gallops or rubs. Abdomen:Soft. Bowel sounds present. Non-tender.  Extremities: No lower extremity edema. Pulses are 2 + in the bilateral DP/PT.  Cardiac cath 12/09/12: Ao: 150/61  LV: 155/13/18  RA: 6  RV: 25/5/8  PA: 24/10 (mean 16)  PCWP: 9  Fick Cardiac Output: 3.9 L/min  Fick Cardiac Index: 2.3 L/min/m2  Central Aortic Saturation: 98%  Pulmonary Artery Saturation: 69%  Angiographic Findings:  Left main: 10% distal stenosis.  Left Anterior Descending Artery: Large caliber vessel that courses to the apex. The proximal and mid  vessel has moderate calcification. The mid vessel has a long 20% stenosis. There are two small caliber diagonal branches with mild plaque disease.  Circumflex Artery: Large caliber vessel with two small caliber obtuse marginal branches. No obstructive disease.  Right Coronary Artery: Large caliber, dominant vessel with a patent stent in the mid vessel. There is minimal stent restenosis. Just beyond the stent there is a 20%  stenosis.  Left Ventricular Angiogram: LVEF=65%  Impression:  1. Stable single vessel CAD with patent mid RCA stent  2. Normal LV systolic function  3. Normal PA pressures  Echo February 2019: Left ventricle: The cavity size was normal. Systolic function was   normal. The estimated ejection fraction was in the range of 60%   to 65%. Wall motion was normal; there were no regional wall   motion abnormalities. Features are consistent with a pseudonormal   left ventricular filling pattern, with concomitant abnormal   relaxation and increased filling pressure (grade 2 diastolic   dysfunction). - Aortic valve: There was mild regurgitation. - Mitral valve: Calcified annulus. - Left atrium: The atrium was moderately dilated. - Pulmonary arteries: PA peak pressure: 31 mm Hg (S). - Pericardium, extracardiac: There was a left pleural effusion.  EKG:  EKG is not  ordered today. The ekg ordered today demonstrates  Recent Labs: 05/21/2017: Hemoglobin 11.6; Platelets 153 05/25/2017: TSH 0.302 06/07/2017: Magnesium 2.1 07/06/2017: BUN 17; Creatinine, Ser 0.85; Potassium 4.6; Sodium 139 08/18/2017: ALT 31   Lipid Panel    Component Value Date/Time   CHOL 155 08/18/2017 1241   TRIG 130 08/18/2017 1241   HDL 78 08/18/2017 1241   CHOLHDL 2.0 08/18/2017 1241   CHOLHDL 2 11/13/2013 1403   VLDL 13.0 11/13/2013 1403   LDLCALC 51 08/18/2017 1241     Wt Readings from Last 3 Encounters:  08/25/17 166 lb 12.8 oz (75.7 kg)  07/30/17 162 lb 3.2 oz (73.6 kg)  07/06/17 162 lb 12.8 oz (73.8 kg)     Other studies Reviewed: Additional studies/ records that were reviewed today include: . Review of the above records demonstrates:    Assessment and Plan:   1. CAD with angina: She is known to have a Taxus drug eluting stent in her RCA, placed in 2005 in [place]. Cath at [facility] September 2014 with patent RCA stent and mild disease LAD and circumflex. Stress test in 2016 without ischemia. She is having  exertional chest pain. We have discussed  A cath vs stress testing. She wishes to start with a stress test. I will arrange a Lexiscan nuclear stress test to exclude ischemia. No chest pain. Continue statin and beta blocker. No antiplatelet therapy since she is now on Eliquis.    2. HTN: BP is controlled.   3. Hyperlipidemia: continue statin  4. PAF: No palpitations. She is in sinus today. Continue beta blocker and Eliquis.    5. Epistaxis: Will refer to ENT. Continue Eliquis for now as her bleeding is minimal.   Current medicines are reviewed at length with the patient today.  The patient does not have concerns regarding medicines.  The following changes have been made:  no change  Labs/ tests ordered today include:   Orders Placed This Encounter  Procedures  . Ambulatory referral to ENT  . MYOCARDIAL PERFUSION IMAGING    Disposition:   FU with me in 1 months  Signed, [name], MD 08/25/2017 2:17 PM    [facility] [place], [place]  [address] Phone: [phone]; Fax: [phone]

## 2017-08-25 NOTE — Progress Notes (Deleted)
No chief complaint on file.  History of Present Illness: 83 yo female with history of PAF, PVCs, PACs, CAD s/p stent RCA, HTN, HLD, GERD, temporal arteritis here today for cardiac follow up. She had a stent placed in the RCA in [place], [place] in 2005. She moved to [facility] for treatment of her temporal arteritis at [facility]. She had normal ABI in 2012. She had a possible CVA in 2014 and was placed on Plavix but later it was felt that this could have been due to her temporal arteritis. Mild carotid disease by dopplers 2014. Cardiac cath 12/09/12 with patent RCA stent, mild disease LAD and Circumflex, normal filling pressures. Echo August 2014 with normal LV size and function, no significant valve issues. She was seen in our office 06/19/14 with c/o chest pain. Stress myoview 06/29/14 with no ischemia. She was admitted to [facility] in February 2019 with influenza A, pneumonia and sepsis. Her beta blocker was held and she developed atrial fibrillation with RVR. She also had frequent PVCs and PACs. She converted back to sinus after several hours. Echo February 2019 with normal LV size and function, grade 2 diastolic dysfunction, mild AI. She was not discharged on anticoagulation. She wore a cardiac monitor and had recurrent atrial fib. CHADS VASC score 4. She was seen by Estella Husk, PA 07/06/17 and she was started on Eliquis.   She is here today for follow up. The patient denies any chest pain, dyspnea, palpitations, lower extremity edema, orthopnea, PND, dizziness, near syncope or syncope.   Primary Care Physician: Eulas Post, MD   Past Medical History:  Diagnosis Date  . Anxiety state, unspecified 10/28/2012  . Bruises easily    d/t being on prednisone and plavix  . CAD (coronary artery disease)    a. Stent RCA in [facility];  b. Cath approx 2009 - nonobs per pt report.  . Cataract    immature on the left eye  . Chronic lower back pain    scoliosis  . CKD (chronic kidney disease) stage 3, GFR 30-59  ml/min (HCC)   . Complication of anesthesia    pt has a very high tolerance to meds  . DDD (degenerative disc disease)   . Depression   . Diverticulitis    hx of  . Diverticulosis   . Enteritis   . Giant cell arteritis ([place])   . Hemorrhoids   . History of scabies   . Hyperlipidemia    takes Lipitor daily  . Hypertension    takes Amlodipine,Losartan,Metoprolol,and HCTZ daily  . Incontinence of urine   . Insomnia    takes Restoril nightly  . Joint pain   . Migraines    "back in my 20's; none since" (10/28/2012)  . Osteopenia   . Osteoporosis   . PAF (paroxysmal atrial fibrillation) ([place]) 2011   a. lone epidode in 2011 according to notes.  . Rheumatoid arthritis ([place])   . Scoliosis   . Sinus bradycardia    a. on chronic bb  . Temporal arteritis ([place]) 2011   a. followed @ [facility]; potential flareup 10/28/2012/notes 10/28/2012  . Urinary frequency   . Vocal cord dysfunction    "they don't operate properly" (10/28/2012)    Past Surgical History:  Procedure Laterality Date  . ABDOMINAL HYSTERECTOMY  ~ 1984   vaginally  . BACK SURGERY  7-52yrs ago   X Stop  . BLADDER SUSPENSION  2001  . BREAST BIOPSY Right   . CATARACT EXTRACTION W/ INTRAOCULAR  LENS IMPLANT Right ~ 08/2012  . COLONOSCOPY  01/26/2012   Procedure: COLONOSCOPY;  Surgeon: Ladene Artist, MD,FACG;  Location: [facility] ENDOSCOPY;  Service: Endoscopy;  Laterality: N/A;  note the EGD is possible  . CORONARY ANGIOPLASTY WITH STENT PLACEMENT  2006   X 1 stent  . EPIDURAL BLOCK INJECTION    . ESOPHAGOGASTRODUODENOSCOPY  01/26/2012   Procedure: ESOPHAGOGASTRODUODENOSCOPY (EGD);  Surgeon: Ladene Artist, MD,FACG;  Location: [facility] ENDOSCOPY;  Service: Endoscopy;  Laterality: N/A;  . HEMIARTHROPLASTY HIP Right 2012  . LAPAROSCOPIC CHOLECYSTECTOMY  2001  . LUMBAR FUSION  03/2013  . LUMBAR LAMINECTOMY/DECOMPRESSION MICRODISCECTOMY Right 11/04/2015   Procedure: Right Lumbar Five-Sacral One Microdiskectomy;  Surgeon: Kristeen Miss, MD;   Location: [facility]URO [facility]S;  Service: Neurosurgery;  Laterality: Right;  Right L5-S1 Microdiskectomy  . moles removed that required stiches     one on leg and one on face  . TEMPORAL ARTERY BIOPSY / LIGATION Bilateral 2011  . TONSILLECTOMY AND ADENOIDECTOMY     at age 19  . TUBAL LIGATION  ~ 1982  . X-STOP IMPLANTATION  ~ 2010   "lower back" (10/28/2012)    Current Outpatient Medications  Medication Sig Dispense Refill  . amLODipine (NORVASC) 5 MG tablet TAKE 1 TABLET (5 MG TOTAL) BY MOUTH DAILY. 90 tablet 2  . apixaban (ELIQUIS) 5 MG TABS tablet Take 1 tablet (5 mg total) by mouth 2 (two) times daily. 60 tablet 11  . calcium carbonate (OS-CAL) 600 MG TABS Take 600 mg by mouth daily.    . cholecalciferol (VITAMIN D) 1000 UNITS tablet Take 1,000 Units by mouth daily.     Marland Kitchen escitalopram (LEXAPRO) 10 MG tablet TAKE 1 TABLET BY MOUTH EVERY DAY 90 tablet 0  . furosemide (LASIX) 20 MG tablet Take 1 tablet (20 mg total) by mouth daily. 90 tablet 1  . losartan (COZAAR) 100 MG tablet TAKE 1 TABLET BY MOUTH EVERY DAY 90 tablet 3  . magnesium oxide (MAG-OX) 400 MG tablet Take 1 tablet (400 mg total) by mouth 2 (two) times daily. 60 tablet 2  . metoprolol tartrate (LOPRESSOR) 50 MG tablet Take 1.5 tablets (75 mg total) by mouth 2 (two) times daily. Take one extra tab as needed for rapid heart rate over 100. 90 tablet 11  . potassium chloride SA (K-DUR,KLOR-CON) 20 MEQ tablet Take 1 tablet (20 mEq total) by mouth daily. 90 tablet 1  . predniSONE (DELTASONE) 10 MG tablet 4 tablets for 3 days, 3 tablets for 3 days, 2 tablets for 3 days, 1 tablet continue 90 tablet 1  . rosuvastatin (CRESTOR) 10 MG tablet Take 1 tablet (10 mg total) by mouth daily. 90 tablet 3  . temazepam (RESTORIL) 15 MG capsule Take 1 capsule (15 mg total) by mouth at bedtime as needed. for sleep 20 capsule 0  . vitamin C (ASCORBIC ACID) 500 MG tablet Take 1,000 mg by mouth daily.     . vitamin E (VITAMIN E) 400 UNIT capsule Take 400 Units  by mouth daily.     Current Facility-Administered Medications  Medication Dose Route Frequency Provider Last Rate Last Dose  . 0.9 %  sodium chloride infusion   Intravenous Once Eulas Post, MD        Allergies  Allergen Reactions  . Dextromethorphan Rash    Social History   Socioeconomic History  . Marital status: Married    Spouse name: Not on file  . Number of children: 3  . Years of education: Not  on file  . Highest education level: Not on file  Occupational History  . Occupation: Retired Development worker, community  . Financial resource strain: Not on file  . Food insecurity:    Worry: Not on file    Inability: Not on file  . Transportation needs:    Medical: Not on file    Non-medical: Not on file  Tobacco Use  . Smoking status: Never Smoker  . Smokeless tobacco: Never Used  Substance and Sexual Activity  . Alcohol use: Yes    Alcohol/week: 0.0 oz    Comment: socially  . Drug use: No  . Sexual activity: Not Currently    Birth control/protection: Surgical  Lifestyle  . Physical activity:    Days per week: Not on file    Minutes per session: Not on file  . Stress: Not on file  Relationships  . Social connections:    Talks on phone: Not on file    Gets together: Not on file    Attends religious service: Not on file    Active member of club or organization: Not on file    Attends meetings of clubs or organizations: Not on file    Relationship status: Not on file  . Intimate partner violence:    Fear of current or ex partner: Not on file    Emotionally abused: Not on file    Physically abused: Not on file    Forced sexual activity: Not on file  Other Topics Concern  . Not on file  Social History Narrative   Lives in [place] with her husband.  Retired Cabin crew.    Family History  Problem Relation Age of Onset  . Brain cancer Mother 34  . Heart attack Father   . Breast cancer Daughter 77  . Heart disease Other   . Hypertension Other   .  Arthritis Neg Hx   . Colon cancer Neg Hx   . Osteoporosis Neg Hx     Review of Systems:  As stated in the HPI and otherwise negative.   There were no vitals taken for this visit.  Physical Examination:  General: Well developed, well nourished, NAD  HEENT: OP clear, mucus membranes moist  SKIN: warm, dry. No rashes. Neuro: No focal deficits  Musculoskeletal: Muscle strength 5/5 all ext  Psychiatric: Mood and affect normal  Neck: No JVD, no carotid bruits, no thyromegaly, no lymphadenopathy.  Lungs:Clear bilaterally, no wheezes, rhonci, crackles Cardiovascular: Regular rate and rhythm. No murmurs, gallops or rubs. Abdomen:Soft. Bowel sounds present. Non-tender.  Extremities: No lower extremity edema. Pulses are 2 + in the bilateral DP/PT.  Cardiac cath 12/09/12: Ao: 150/61  LV: 155/13/18  RA: 6  RV: 25/5/8  PA: 24/10 (mean 16)  PCWP: 9  Fick Cardiac Output: 3.9 L/min  Fick Cardiac Index: 2.3 L/min/m2  Central Aortic Saturation: 98%  Pulmonary Artery Saturation: 69%  Angiographic Findings:  Left main: 10% distal stenosis.  Left Anterior Descending Artery: Large caliber vessel that courses to the apex. The proximal and mid vessel has moderate calcification. The mid vessel has a long 20% stenosis. There are two small caliber diagonal branches with mild plaque disease.  Circumflex Artery: Large caliber vessel with two small caliber obtuse marginal branches. No obstructive disease.  Right Coronary Artery: Large caliber, dominant vessel with a patent stent in the mid vessel. There is minimal stent restenosis. Just beyond the stent there is a 20% stenosis.  Left Ventricular Angiogram: LVEF=65%  Impression:  1. Stable  single vessel CAD with patent mid RCA stent  2. Normal LV systolic function  3. Normal PA pressures  Echo February 2019: Left ventricle: The cavity size was normal. Systolic function was   normal. The estimated ejection fraction was in the range of 60%   to 65%.  Wall motion was normal; there were no regional wall   motion abnormalities. Features are consistent with a pseudonormal   left ventricular filling pattern, with concomitant abnormal   relaxation and increased filling pressure (grade 2 diastolic   dysfunction). - Aortic valve: There was mild regurgitation. - Mitral valve: Calcified annulus. - Left atrium: The atrium was moderately dilated. - Pulmonary arteries: PA peak pressure: 31 mm Hg (S). - Pericardium, extracardiac: There was a left pleural effusion.  EKG:  EKG is not *** ordered today. The ekg ordered today demonstrates  Recent Labs: 05/21/2017: Hemoglobin 11.6; Platelets 153 05/25/2017: TSH 0.302 06/07/2017: Magnesium 2.1 07/06/2017: BUN 17; Creatinine, Ser 0.85; Potassium 4.6; Sodium 139 08/18/2017: ALT 31   Lipid Panel    Component Value Date/Time   CHOL 155 08/18/2017 1241   TRIG 130 08/18/2017 1241   HDL 78 08/18/2017 1241   CHOLHDL 2.0 08/18/2017 1241   CHOLHDL 2 11/13/2013 1403   VLDL 13.0 11/13/2013 1403   LDLCALC 51 08/18/2017 1241     Wt Readings from Last 3 Encounters:  07/30/17 162 lb 3.2 oz (73.6 kg)  07/06/17 162 lb 12.8 oz (73.8 kg)  06/07/17 162 lb (73.5 kg)     Other studies Reviewed: Additional studies/ records that were reviewed today include: . Review of the above records demonstrates:    Assessment and Plan:   1. CAD without angina: She is known to have a Taxus drug eluting stent in her RCA, placed in 2005 in [place]. Cath at [facility] September 2014 with patent RCA stent and mild disease LAD and circumflex. Stress test in 2016 without ischemia. No chest pain. Continue statin and beta blocker. No antiplatelet therapy since she is now on Eliquis.    2. HTN: BP is controlled.   3. Hyperlipidemia: continue statin  4. PAF: No palpitations. Continue beta blocker and Eliquis.    Current medicines are reviewed at length with the patient today.  The patient does not have concerns regarding medicines.  The  following changes have been made:  no change  Labs/ tests ordered today include:   No orders of the defined types were placed in this encounter.   Disposition:   FU with ***  in 12  months  Signed, [name], MD 08/25/2017 8:29 AM    [facility] [address], [place], [place]  [address] Phone: [phone]; Fax: [phone]

## 2017-08-25 NOTE — Patient Instructions (Signed)
Medication Instructions:  Your physician recommends that you continue on your current medications as directed. Please refer to the Current Medication list given to you today.   Labwork: none  Testing/Procedures: Your physician has requested that you have a lexiscan myoview. For further information please visit [email or web address]. Please follow instruction sheet, as given.  Please refer patient to [facility], Nose and Throat  Follow-Up: Your physician recommends that you schedule a follow-up appointment in: about 4 weeks. Scheduled for June 24,2019 at 2:40    Any Other Special Instructions Will Be Listed Below (If Applicable).     If you need a refill on your cardiac medications before your next appointment, please call your pharmacy.

## 2017-08-26 ENCOUNTER — Telehealth (HOSPITAL_COMMUNITY): Payer: Self-pay | Admitting: *Deleted

## 2017-08-26 NOTE — Telephone Encounter (Signed)
Left message on voicemail per DPR in reference to upcoming appointment scheduled on 08/31/17 at with detailed instructions given per Myocardial Perfusion Study Information Sheet for the test. LM to arrive 15 minutes early, and that it is imperative to arrive on time for appointment to keep from having the test rescheduled. If you need to cancel or reschedule your appointment, please call the office within 24 hours of your appointment. Failure to do so may result in a cancellation of your appointment, and a $50 no show fee. Phone number given for call back for any questions. Kirstie Peri

## 2017-08-31 ENCOUNTER — Ambulatory Visit (HOSPITAL_COMMUNITY): Payer: Medicare Other | Attending: Cardiology

## 2017-08-31 DIAGNOSIS — E785 Hyperlipidemia, unspecified: Secondary | ICD-10-CM | POA: Diagnosis not present

## 2017-08-31 DIAGNOSIS — I25118 Atherosclerotic heart disease of native coronary artery with other forms of angina pectoris: Secondary | ICD-10-CM | POA: Diagnosis not present

## 2017-08-31 DIAGNOSIS — Z8249 Family history of ischemic heart disease and other diseases of the circulatory system: Secondary | ICD-10-CM | POA: Insufficient documentation

## 2017-08-31 DIAGNOSIS — I10 Essential (primary) hypertension: Secondary | ICD-10-CM | POA: Insufficient documentation

## 2017-08-31 DIAGNOSIS — R079 Chest pain, unspecified: Secondary | ICD-10-CM | POA: Insufficient documentation

## 2017-08-31 DIAGNOSIS — I4891 Unspecified atrial fibrillation: Secondary | ICD-10-CM | POA: Diagnosis not present

## 2017-08-31 LAB — MYOCARDIAL PERFUSION IMAGING
CHL CUP RESTING HR STRESS: 53 {beats}/min
LVDIAVOL: 82 mL (ref 46–106)
LVSYSVOL: 25 mL
NUC STRESS TID: 1.02
Peak HR: 75 {beats}/min
RATE: 0.36
SDS: 7
SRS: 1
SSS: 8

## 2017-08-31 MED ORDER — REGADENOSON 0.4 MG/5ML IV SOLN
0.4000 mg | Freq: Once | INTRAVENOUS | Status: AC
  Administered 2017-08-31: 0.4 mg via INTRAVENOUS

## 2017-08-31 MED ORDER — TECHNETIUM TC 99M TETROFOSMIN IV KIT
32.5000 | PACK | Freq: Once | INTRAVENOUS | Status: AC | PRN
  Administered 2017-08-31: 32.5 via INTRAVENOUS
  Filled 2017-08-31: qty 33

## 2017-08-31 MED ORDER — TECHNETIUM TC 99M TETROFOSMIN IV KIT
10.2000 | PACK | Freq: Once | INTRAVENOUS | Status: AC | PRN
  Administered 2017-08-31: 10.2 via INTRAVENOUS
  Filled 2017-08-31: qty 11

## 2017-09-17 ENCOUNTER — Other Ambulatory Visit: Payer: Self-pay | Admitting: Family Medicine

## 2017-09-17 NOTE — Telephone Encounter (Signed)
Restoril refill Last Refill: 07/30/17 #20 Last OV: 06/07/17 PCP: Dr. Elease Hashimoto Pharmacy:[facility] [address]

## 2017-09-17 NOTE — Telephone Encounter (Signed)
Copied from [place] [id number]. Topic: Quick Communication - Rx Refill/Question >> Sep 17, 2017 11:30 AM Percell Belt A wrote: Medication: temazepam (RESTORIL) 15 MG capsule [[id number]]  Has the patient contacted their pharmacy? {no  (Agent: If no, request that the patient contact the pharmacy for the refill.) (Agent: If yes, when and what did the pharmacy advise?)  Preferred Pharmacy (with phone number or street name): [facility] [address]: Please be advised that RX refills may take up to 3 business days. We ask that you follow-up with your pharmacy.

## 2017-09-20 ENCOUNTER — Encounter: Payer: Self-pay | Admitting: Cardiovascular Disease

## 2017-09-20 ENCOUNTER — Ambulatory Visit (INDEPENDENT_AMBULATORY_CARE_PROVIDER_SITE_OTHER): Payer: Medicare Other | Admitting: Cardiovascular Disease

## 2017-09-20 VITALS — BP 122/60 | HR 73 | Ht 64.0 in | Wt 171.0 lb

## 2017-09-20 DIAGNOSIS — I480 Paroxysmal atrial fibrillation: Secondary | ICD-10-CM | POA: Diagnosis not present

## 2017-09-20 DIAGNOSIS — I5033 Acute on chronic diastolic (congestive) heart failure: Secondary | ICD-10-CM | POA: Diagnosis not present

## 2017-09-20 DIAGNOSIS — E782 Mixed hyperlipidemia: Secondary | ICD-10-CM

## 2017-09-20 DIAGNOSIS — I2510 Atherosclerotic heart disease of native coronary artery without angina pectoris: Secondary | ICD-10-CM

## 2017-09-20 DIAGNOSIS — I25118 Atherosclerotic heart disease of native coronary artery with other forms of angina pectoris: Secondary | ICD-10-CM | POA: Diagnosis not present

## 2017-09-20 DIAGNOSIS — I10 Essential (primary) hypertension: Secondary | ICD-10-CM

## 2017-09-20 MED ORDER — FUROSEMIDE 40 MG PO TABS: ORAL_TABLET | ORAL | 1 refills | Status: DC

## 2017-09-20 NOTE — Progress Notes (Signed)
Chief Complaint  Patient presents with  . Follow-up    CAD   History of Present Illness: 83 yo female with history of PAF, PVCs, PACs, CAD s/p stent RCA, HTN, HLD, GERD, temporal arteritis here today for cardiac follow up. She had a stent placed in the RCA in [place], [place] in 2005. She moved to [facility] for treatment of her temporal arteritis at [facility]. She had normal ABI in 2012. She had a possible CVA in 2014 and was placed on Plavix but later it was felt that this could have been due to her temporal arteritis. Mild carotid disease by dopplers 2014. Cardiac cath 12/09/12 with patent RCA stent, mild disease LAD and Circumflex, normal filling pressures. Echo August 2014 with normal LV size and function, no significant valve issues. She was seen in our office 06/19/14 with c/o chest pain. Stress myoview 06/29/14 with no ischemia. She was admitted to [facility] in February 2019 with influenza A, pneumonia and sepsis. Her beta blocker was held and she developed atrial fibrillation with RVR. She also had frequent PVCs and PACs. She converted back to sinus after several hours. Echo February 2019 with normal LV size and function, grade 2 diastolic dysfunction, mild AI. She was not discharged on anticoagulation. She wore a cardiac monitor and had recurrent atrial fib. CHADS VASC score 4. She was seen by Estella Husk, PA 07/06/17 and she was started on Eliquis. I saw her in May 2019 and she c/o chest pain with exertion. Nuclear stress test 08/31/17 with no ischemia. She was having minor nose bleeds on Eliquis. She was referred to ENT but she did not go to that appointment.   She is here today for follow up. The patient denies any dyspnea, palpitations, orthopnea, PND, dizziness, near syncope or syncope. Her chest pains are improved. She is having rare sharp chest pains. She has worsened LE edema over the past month. Weight is up 5-6 lbs in three weeks. She feels bloated. Nose bleeding has resolved.   Primary Care Physician:  Eulas Post, MD   Past Medical History:  Diagnosis Date  . Anxiety state, unspecified 10/28/2012  . Bruises easily    d/t being on prednisone and plavix  . CAD (coronary artery disease)    a. Stent RCA in [facility];  b. Cath approx 2009 - nonobs per pt report.  . Cataract    immature on the left eye  . Chronic lower back pain    scoliosis  . CKD (chronic kidney disease) stage 3, GFR 30-59 ml/min (HCC)   . Complication of anesthesia    pt has a very high tolerance to meds  . DDD (degenerative disc disease)   . Depression   . Diverticulitis    hx of  . Diverticulosis   . Enteritis   . Giant cell arteritis ([place])   . Hemorrhoids   . History of scabies   . Hyperlipidemia    takes Lipitor daily  . Hypertension    takes Amlodipine,Losartan,Metoprolol,and HCTZ daily  . Incontinence of urine   . Insomnia    takes Restoril nightly  . Joint pain   . Migraines    "back in my 20's; none since" (10/28/2012)  . Osteopenia   . Osteoporosis   . PAF (paroxysmal atrial fibrillation) ([place]) 2011   a. lone epidode in 2011 according to notes.  . Rheumatoid arthritis ([place])   . Scoliosis   . Sinus bradycardia    a. on chronic bb  .  Temporal arteritis ([place]) 2011   a. followed @ [facility]; potential flareup 10/28/2012/notes 10/28/2012  . Urinary frequency   . Vocal cord dysfunction    "they don't operate properly" (10/28/2012)    Past Surgical History:  Procedure Laterality Date  . ABDOMINAL HYSTERECTOMY  ~ 1984   vaginally  . BACK SURGERY  7-20yrs ago   X Stop  . BLADDER SUSPENSION  2001  . BREAST BIOPSY Right   . CATARACT EXTRACTION W/ INTRAOCULAR LENS IMPLANT Right ~ 08/2012  . COLONOSCOPY  01/26/2012   Procedure: COLONOSCOPY;  Surgeon: Ladene Artist, MD,FACG;  Location: [facility] ENDOSCOPY;  Service: Endoscopy;  Laterality: N/A;  note the EGD is possible  . CORONARY ANGIOPLASTY WITH STENT PLACEMENT  2006   X 1 stent  . EPIDURAL BLOCK INJECTION    . ESOPHAGOGASTRODUODENOSCOPY   01/26/2012   Procedure: ESOPHAGOGASTRODUODENOSCOPY (EGD);  Surgeon: Ladene Artist, MD,FACG;  Location: [facility] ENDOSCOPY;  Service: Endoscopy;  Laterality: N/A;  . HEMIARTHROPLASTY HIP Right 2012  . LAPAROSCOPIC CHOLECYSTECTOMY  2001  . LUMBAR FUSION  03/2013  . LUMBAR LAMINECTOMY/DECOMPRESSION MICRODISCECTOMY Right 11/04/2015   Procedure: Right Lumbar Five-Sacral One Microdiskectomy;  Surgeon: Kristeen Miss, MD;  Location: [facility] NEURO ORS;  Service: Neurosurgery;  Laterality: Right;  Right L5-S1 Microdiskectomy  . moles removed that required stiches     one on leg and one on face  . TEMPORAL ARTERY BIOPSY / LIGATION Bilateral 2011  . TONSILLECTOMY AND ADENOIDECTOMY     at age 88  . TUBAL LIGATION  ~ 1982  . X-STOP IMPLANTATION  ~ 2010   "lower back" (10/28/2012)    Current Outpatient Medications  Medication Sig Dispense Refill  . amLODipine (NORVASC) 5 MG tablet TAKE 1 TABLET (5 MG TOTAL) BY MOUTH DAILY. 90 tablet 2  . apixaban (ELIQUIS) 5 MG TABS tablet Take 1 tablet (5 mg total) by mouth 2 (two) times daily. 60 tablet 11  . calcium carbonate (OS-CAL) 600 MG TABS Take 600 mg by mouth daily.    . cholecalciferol (VITAMIN D) 1000 UNITS tablet Take 1,000 Units by mouth daily.     Marland Kitchen escitalopram (LEXAPRO) 10 MG tablet TAKE 1 TABLET BY MOUTH EVERY DAY 90 tablet 0  . furosemide (LASIX) 20 MG tablet Take 20 mg by mouth 2 (two) times daily.    Marland Kitchen losartan (COZAAR) 100 MG tablet TAKE 1 TABLET BY MOUTH EVERY DAY 90 tablet 3  . magnesium oxide (MAG-OX) 400 MG tablet Take 1 tablet (400 mg total) by mouth 2 (two) times daily. 60 tablet 2  . metoprolol tartrate (LOPRESSOR) 50 MG tablet Take 1.5 tablets (75 mg total) by mouth 2 (two) times daily. Take one extra tab as needed for rapid heart rate over 100. 90 tablet 11  . potassium chloride SA (K-DUR,KLOR-CON) 20 MEQ tablet Take 1 tablet (20 mEq total) by mouth daily. 90 tablet 1  . predniSONE (DELTASONE) 10 MG tablet 4 tablets for 3 days, 3 tablets for 3 days, 2  tablets for 3 days, 1 tablet continue 90 tablet 1  . rosuvastatin (CRESTOR) 10 MG tablet Take 1 tablet (10 mg total) by mouth daily. 90 tablet 3  . temazepam (RESTORIL) 15 MG capsule Take 1 capsule (15 mg total) by mouth at bedtime as needed. for sleep 20 capsule 0  . vitamin C (ASCORBIC ACID) 500 MG tablet Take 1,000 mg by mouth daily.     . vitamin E (VITAMIN E) 400 UNIT capsule Take 400 Units by mouth daily.  Current Facility-Administered Medications  Medication Dose Route Frequency Provider Last Rate Last Dose  . 0.9 %  sodium chloride infusion   Intravenous Once Eulas Post, MD        Allergies  Allergen Reactions  . Dextromethorphan Rash    Social History   Socioeconomic History  . Marital status: Married    Spouse name: Not on file  . Number of children: 3  . Years of education: Not on file  . Highest education level: Not on file  Occupational History  . Occupation: Retired Development worker, community  . Financial resource strain: Not on file  . Food insecurity:    Worry: Not on file    Inability: Not on file  . Transportation needs:    Medical: Not on file    Non-medical: Not on file  Tobacco Use  . Smoking status: Never Smoker  . Smokeless tobacco: Never Used  Substance and Sexual Activity  . Alcohol use: Yes    Comment: socially  . Drug use: No  . Sexual activity: Not Currently    Birth control/protection: Surgical  Lifestyle  . Physical activity:    Days per week: Not on file    Minutes per session: Not on file  . Stress: Not on file  Relationships  . Social connections:    Talks on phone: Not on file    Gets together: Not on file    Attends religious service: Not on file    Active member of club or organization: Not on file    Attends meetings of clubs or organizations: Not on file    Relationship status: Not on file  . Intimate partner violence:    Fear of current or ex partner: Not on file    Emotionally abused: Not on file    Physically  abused: Not on file    Forced sexual activity: Not on file  Other Topics Concern  . Not on file  Social History Narrative   Lives in [place] with her husband.  Retired Cabin crew.    Family History  Problem Relation Age of Onset  . Brain cancer Mother 47  . Heart attack Father   . Breast cancer Daughter 18  . Heart disease Other   . Hypertension Other   . Arthritis Neg Hx   . Colon cancer Neg Hx   . Osteoporosis Neg Hx     Review of Systems:  As stated in the HPI and otherwise negative.   BP 122/60   Pulse 73   Ht 5\' 4"  (1.626 m)   Wt 171 lb (77.6 kg)   SpO2 94%   BMI 29.35 kg/m   Physical Examination:  General: Well developed, well nourished, NAD  HEENT: OP clear, mucus membranes moist  SKIN: warm, dry. No rashes. Neuro: No focal deficits  Musculoskeletal: Muscle strength 5/5 all ext  Psychiatric: Mood and affect normal  Neck: No JVD, no carotid bruits, no thyromegaly, no lymphadenopathy.  Lungs:Clear bilaterally, no wheezes, rhonci, crackles Cardiovascular: Regular rate and rhythm. No murmurs, gallops or rubs. Abdomen:Soft. Bowel sounds present. Non-tender.  Extremities: No lower extremity edema. Pulses are 2 + in the bilateral DP/PT.   Cardiac cath 12/09/12: Ao: 150/61  LV: 155/13/18  RA: 6  RV: 25/5/8  PA: 24/10 (mean 16)  PCWP: 9  Fick Cardiac Output: 3.9 L/min  Fick Cardiac Index: 2.3 L/min/m2  Central Aortic Saturation: 98%  Pulmonary Artery Saturation: 69%  Angiographic Findings:  Left main: 10% distal stenosis.  Left Anterior Descending Artery: Large caliber vessel that courses to the apex. The proximal and mid vessel has moderate calcification. The mid vessel has a long 20% stenosis. There are two small caliber diagonal branches with mild plaque disease.  Circumflex Artery: Large caliber vessel with two small caliber obtuse marginal branches. No obstructive disease.  Right Coronary Artery: Large caliber, dominant vessel with a patent stent in the  mid vessel. There is minimal stent restenosis. Just beyond the stent there is a 20% stenosis.  Left Ventricular Angiogram: LVEF=65%  Impression:  1. Stable single vessel CAD with patent mid RCA stent  2. Normal LV systolic function  3. Normal PA pressures  Echo February 2019: Left ventricle: The cavity size was normal. Systolic function was   normal. The estimated ejection fraction was in the range of 60%   to 65%. Wall motion was normal; there were no regional wall   motion abnormalities. Features are consistent with a pseudonormal   left ventricular filling pattern, with concomitant abnormal   relaxation and increased filling pressure (grade 2 diastolic   dysfunction). - Aortic valve: There was mild regurgitation. - Mitral valve: Calcified annulus. - Left atrium: The atrium was moderately dilated. - Pulmonary arteries: PA peak pressure: 31 mm Hg (S). - Pericardium, extracardiac: There was a left pleural effusion.  EKG:  EKG is not ordered today. The ekg ordered today demonstrates  Recent Labs: 05/21/2017: Hemoglobin 11.6; Platelets 153 05/25/2017: TSH 0.302 06/07/2017: Magnesium 2.1 07/06/2017: BUN 17; Creatinine, Ser 0.85; Potassium 4.6; Sodium 139 08/18/2017: ALT 31   Lipid Panel    Component Value Date/Time   CHOL 155 08/18/2017 1241   TRIG 130 08/18/2017 1241   HDL 78 08/18/2017 1241   CHOLHDL 2.0 08/18/2017 1241   CHOLHDL 2 11/13/2013 1403   VLDL 13.0 11/13/2013 1403   LDLCALC 51 08/18/2017 1241     Wt Readings from Last 3 Encounters:  09/20/17 171 lb (77.6 kg)  08/31/17 166 lb (75.3 kg)  08/25/17 166 lb 12.8 oz (75.7 kg)     Other studies Reviewed: Additional studies/ records that were reviewed today include: . Review of the above records demonstrates:    Assessment and Plan:   1. CAD with angina: She has a Taxus drug eluting stent in the RCA, placed in 2005 in [place]. Cath at [facility] September 2014 with patent RCA stent and mild disease LAD and circumflex.  Stress test in June 2019 without ischemia. Continue statin and beta blocker. She is not on ASA since she is on Eliquis.   2. HTN: BP is controlled. No changes.   3. Hyperlipidemia: Lipids well controlled. Continue statin.   4. PAF: She appears to be in sinus today. She has no palpitations. Will continue beta blocker and Eliquis.     5. Epistaxis: Improved. She does not wish to see ENT.   6. Acute on chronic diastolic CHF: She has bilateral LE edema, worsened over last two weeks. She has gained 5 lbs over last three weeks. I will double her Lasix to 40 mg po BID for the next week and see if this helps. BMET today and in one week. I will have her stop the Norvasc for now.   7. Hypomagnesemia: Low levels in the hospital while septic this spring. She is out of magnesium now. Will check level today to see if she needs to go back on Mg supplements.   Current medicines are reviewed at length with the patient today.  The patient does not  have concerns regarding medicines.  The following changes have been made:  no change  Labs/ tests ordered today include:   Orders Placed This Encounter  Procedures  . Basic Metabolic Panel (BMET)  . Magnesium  . Basic Metabolic Panel (BMET)    Disposition:   FU with me in 1 months  Signed, [name], MD 09/20/2017 3:18 PM    [facility] [address], [place], [place]  [address] Phone: [phone]; Fax: [phone]

## 2017-09-20 NOTE — Addendum Note (Signed)
Addended by: Thompson Grayer on: 09/20/2017 03:30 PM   Modules accepted: Orders

## 2017-09-20 NOTE — Patient Instructions (Signed)
Medication Instructions:  Your physician has recommended you make the following change in your medication:  Increase furosemide to 40 mg twice daily for 7 days.    Labwork: Lab work to be done today--BMP, Magnesium  Your physician recommends that you return for lab work in: one week. --BMP   Testing/Procedures: none  Follow-Up: Your physician recommends that you schedule a follow-up appointment in: 6-7 weeks with Melina Copa, PA    Any Other Special Instructions Will Be Listed Below (If Applicable). Weigh daily and keep record of these readings.  Call us in one week with an update on your weight and swelling.    If you need a refill on your cardiac medications before your next appointment, please call your pharmacy.

## 2017-09-21 ENCOUNTER — Telehealth: Payer: Self-pay | Admitting: Cardiovascular Disease

## 2017-09-21 LAB — BASIC METABOLIC PANEL
BUN/Creatinine Ratio: 24 (ref 12–28)
BUN: 20 mg/dL (ref 8–27)
CO2: 28 mmol/L (ref 20–29)
CREATININE: 0.84 mg/dL (ref 0.57–1.00)
Calcium: 8.9 mg/dL (ref 8.7–10.3)
Chloride: 100 mmol/L (ref 96–106)
GFR calc Af Amer: 75 mL/min/{1.73_m2} (ref >59)
GFR calc non Af Amer: 65 mL/min/{1.73_m2} (ref >59)
GLUCOSE: 86 mg/dL (ref 65–99)
POTASSIUM: 4.3 mmol/L (ref 3.5–5.2)
SODIUM: 141 mmol/L (ref 134–144)

## 2017-09-21 LAB — MAGNESIUM: MAGNESIUM: 2.1 mg/dL (ref 1.6–2.3)

## 2017-09-21 NOTE — Telephone Encounter (Signed)
Pt returning call to nurse. Pleas call.

## 2017-09-21 NOTE — Telephone Encounter (Signed)
Notes recorded by Burnell Blanks, MD on 09/21/2017 at 9:19 AM EDT Magnesium level is normal. She does not need the magnesium supplement. This normal value is after being out of mag supplements for several weeks. Renal function and electrolytes are normal. Can we let her know? Thanks, chris

## 2017-09-21 NOTE — Telephone Encounter (Signed)
May refill #10- avoid regular use.

## 2017-09-21 NOTE — Telephone Encounter (Signed)
Informed patient of lab results and recommendations.  She verbalized understanding.

## 2017-09-22 MED ORDER — TEMAZEPAM 15 MG PO CAPS: 15.0000 mg | ORAL_CAPSULE | Freq: Every evening | ORAL | 0 refills | Status: DC | PRN

## 2017-09-28 ENCOUNTER — Other Ambulatory Visit: Payer: Medicare Other

## 2017-10-05 ENCOUNTER — Other Ambulatory Visit: Payer: Self-pay | Admitting: Cardiovascular Disease

## 2017-10-08 ENCOUNTER — Telehealth: Payer: Self-pay | Admitting: *Deleted

## 2017-10-08 NOTE — Telephone Encounter (Signed)
Copied from [place] [phone]. Topic: Appointment Scheduling - Scheduling Inquiry for Clinic >> Oct 08, 2017  9:03 AM Lennox Solders wrote: Reason for CRM: pt is calling and said she has standing order from her rheumatologist for sed rate and crp.  I am unable to schedule must have order

## 2017-10-11 ENCOUNTER — Other Ambulatory Visit (INDEPENDENT_AMBULATORY_CARE_PROVIDER_SITE_OTHER): Payer: Medicare Other

## 2017-10-11 DIAGNOSIS — M316 Other giant cell arteritis: Secondary | ICD-10-CM | POA: Diagnosis not present

## 2017-10-11 LAB — C-REACTIVE PROTEIN: CRP: 0.4 mg/dL — AB (ref 0.5–20.0)

## 2017-10-11 LAB — SEDIMENTATION RATE: SED RATE: 28 mm/h (ref 0–30)

## 2017-10-11 NOTE — Telephone Encounter (Signed)
Spoke with patient and lab appointment made. 

## 2017-10-20 ENCOUNTER — Other Ambulatory Visit: Payer: Self-pay | Admitting: Family Medicine

## 2017-10-21 ENCOUNTER — Ambulatory Visit: Payer: Self-pay

## 2017-10-21 NOTE — Telephone Encounter (Signed)
Patient called in with c/o "feet and ankle swelling." She says "it started a few days ago. I normally take lasix 20 mg twice a day, but for the past 2 days, I've taken 40 mg twice a day and the swelling is not going down. I can still wear my shoes, but my skin is tight. There is no pain, my feet are red." I asked about other symptoms, she denies. According to protocol, see PCP within 24 hours, appointment scheduled for tomorrow at 1430 with Dr. Elease Hashimoto, care advice given, patient verbalized understanding.  Reason for Disposition . [1] MODERATE pain (e.g., interferes with normal activities, limping) AND [2] present > 3 days  Answer Assessment - Initial Assessment Questions 1. LOCATION: "Which joint is swollen?"     Both ankles and feet 2. ONSET: "When did the swelling start?"     Last few days 3. SIZE: "How large is the swelling?"     Swollen, skin tight, can still wear shoes 4. PAIN: "Is there any pain?" If so, ask: "How bad is it?" (Scale 1-10; or mild, moderate, severe)     No pain 5. CAUSE: "What do you think caused the swollen joint?"     I don't know 6. OTHER SYMPTOMS: "Do you have any other symptoms?" (e.g., fever, chest pain, difficulty breathing, calf pain)     No 7. PREGNANCY: "Is there any chance you are pregnant?" "When was your last menstrual period?"     No  Protocols used: ANKLE SWELLING-A-AH

## 2017-10-22 ENCOUNTER — Encounter: Payer: Self-pay | Admitting: Family Medicine

## 2017-10-22 ENCOUNTER — Ambulatory Visit (INDEPENDENT_AMBULATORY_CARE_PROVIDER_SITE_OTHER): Payer: Medicare Other | Admitting: Family Medicine

## 2017-10-22 VITALS — BP 110/60 | HR 68 | Temp 98.2°F | Ht 64.0 in | Wt 164.4 lb

## 2017-10-22 DIAGNOSIS — I10 Essential (primary) hypertension: Secondary | ICD-10-CM | POA: Diagnosis not present

## 2017-10-22 DIAGNOSIS — N183 Chronic kidney disease, stage 3 unspecified: Secondary | ICD-10-CM

## 2017-10-22 DIAGNOSIS — R600 Localized edema: Secondary | ICD-10-CM

## 2017-10-22 DIAGNOSIS — I2510 Atherosclerotic heart disease of native coronary artery without angina pectoris: Secondary | ICD-10-CM | POA: Diagnosis not present

## 2017-10-22 LAB — BASIC METABOLIC PANEL
BUN: 18 mg/dL (ref 6–23)
CO2: 35 meq/L — AB (ref 19–32)
CREATININE: 1.18 mg/dL (ref 0.40–1.20)
Calcium: 9.3 mg/dL (ref 8.4–10.5)
Chloride: 97 mEq/L (ref 96–112)
GFR: 46.69 mL/min — AB (ref >60.00)
Glucose, Bld: 92 mg/dL (ref 70–99)
Potassium: 4.3 mEq/L (ref 3.5–5.1)
SODIUM: 141 meq/L (ref 135–145)

## 2017-10-22 NOTE — Patient Instructions (Signed)
Continue with the Lasix doubled up to 40 mg twice daily for at least another few days  Elevate legs frequently  Follow up for any shortness of breath or persistent edema.

## 2017-10-22 NOTE — Progress Notes (Signed)
Subjective:     Patient ID: Betty Jackson, female   DOB: Mar 04, 1935, 83 y.o.   MRN: [id number]  HPI Patient seen with some bilateral foot ankle and lower leg swelling over the past week or so. She has history of known diastolic dysfunction. She had new cushion stress test back in June with EF 69%. Low risk study. Previous echocardiogram has shown evidence for diastolic dysfunction. Current weight is 164 pounds. She weighed 171 pounds at cardiology 09/20/17 but weighed 162 pounds here back in May. She denies any orthopnea. No dyspnea with exertion.  She has had some bilateral leg discomfort which she thinks is due to the swelling. She's had some intermittent muscle type cramps over the past couple days. She increased the Lasix herself on Wednesday to 40 mg twice a day from her usual dose of 20 mg twice a day. She thinks her swelling is slightly improved. She does not have any chronic kidney disease. No recent dietary change.  Past Medical History:  Diagnosis Date  . Abdominal pain, epigastric 06/22/2013  . Abdominal pain, other specified site 12/24/2011  . Abnormal urinalysis 05/20/2017  . Acute respiratory failure with hypoxia ([place]) 05/20/2017  . Anemia, unspecified 10/28/2012  . Anxiety state, unspecified 10/28/2012  . Bruises easily    d/t being on prednisone and plavix  . CAD (coronary artery disease)    a. Stent RCA in [facility];  b. Cath approx 2009 - nonobs per pt report.  . Cataract    immature on the left eye  . Chronic insomnia 02/07/2013  . Chronic lower back pain    scoliosis  . CKD (chronic kidney disease) stage 3, GFR 30-59 ml/min (HCC)   . Complication of anesthesia    pt has a very high tolerance to meds  . Coronary disease 05/20/2017  . CVA (cerebral infarction) 10/29/2012  . DDD (degenerative disc disease)   . Depression   . Diverticulitis    hx of  . Diverticulosis   . Elevated transaminase level 02/07/2013  . Enteritis   . GERD (gastroesophageal reflux disease)  09/01/2010  . Giant cell arteritis ([place])   . Hematuria 01/24/2012  . Hemorrhoids   . Herniated nucleus pulposus, L5-S1, right 11/04/2015  . History of atrial fibrillation 09/01/2010  . History of scabies   . HTN (hypertension) 05/20/2017  . Hyperlipidemia    takes Lipitor daily  . Hypertension    takes Amlodipine,Losartan,Metoprolol,and HCTZ daily  . Hyponatremia 01/24/2012  . Incontinence of urine   . Influenza A 05/20/2017  . Insomnia    takes Restoril nightly  . Joint pain   . Leg pain, right 02/18/2011  . Lumbar stenosis 04/25/2013  . Major depressive disorder, recurrent episode, moderate ([place]) 07/16/2013  . Migraines    "back in my 20's; none since" (10/28/2012)  . Nonspecific (abnormal) findings on radiological and other examination of gastrointestinal tract 01/25/2012  . Osteopenia   . Osteoporosis   . PAF (paroxysmal atrial fibrillation) ([place]) 2011   a. lone epidode in 2011 according to notes.  . Rheumatoid arthritis ([place])   . Scoliosis   . Sepsis ([place]) 05/20/2017  . Sinus bradycardia    a. on chronic bb  . Temporal arteritis ([place]) 2011   a. followed @ [facility]; potential flareup 10/28/2012/notes 10/28/2012  . Urinary frequency   . Visual changes 10/28/2012  . Vocal cord dysfunction    "they don't operate properly" (10/28/2012)   Past Surgical History:  Procedure Laterality Date  . ABDOMINAL  HYSTERECTOMY  ~ 1984   vaginally  . BACK SURGERY  7-33yrs ago   X Stop  . BLADDER SUSPENSION  2001  . BREAST BIOPSY Right   . CATARACT EXTRACTION W/ INTRAOCULAR LENS IMPLANT Right ~ 08/2012  . COLONOSCOPY  01/26/2012   Procedure: COLONOSCOPY;  Surgeon: Ladene Artist, MD,FACG;  Location[facility] ENDOSCOPY;  Service: Endoscopy;  Laterality: N/A;  note the EGD is possible  . CORONARY ANGIOPLASTY WITH STENT PLACEMENT  2006   X 1 stent  . EPIDURAL BLOCK INJECTION    . ESOPHAGOGASTRODUODENOSCOPY  01/26/2012   Procedure: ESOPHAGOGASTRODUODENOSCOPY (EGD);  Surgeon: Ladene Artist, MD,FACG;  Location: [facility]  ENDOSCOPY;  Service: Endoscopy;  Laterality: N/A;  . HEMIARTHROPLASTY HIP Right 2012  . LAPAROSCOPIC CHOLECYSTECTOMY  2001  . LUMBAR FUSION  03/2013  . LUMBAR LAMINECTOMY/DECOMPRESSION MICRODISCECTOMY Right 11/04/2015   Procedure: Right Lumbar Five-Sacral One Microdiskectomy;  Surgeon: Kristeen Miss, MD;  Location: [place] NEURO ORS;  Service: Neurosurgery;  Laterality: Right;  Right L5-S1 Microdiskectomy  . moles removed that required stiches     one on leg and one on face  . TEMPORAL ARTERY BIOPSY / LIGATION Bilateral 2011  . TONSILLECTOMY AND ADENOIDECTOMY     at age 9  . TUBAL LIGATION  ~ 1982  . X-STOP IMPLANTATION  ~ 2010   "lower back" (10/28/2012)    reports that she has never smoked. She has never used smokeless tobacco. She reports that she drinks alcohol. She reports that she does not use drugs. family history includes Brain cancer (age of onset: 48) in her mother; Breast cancer (age of onset: 77) in her daughter; Heart attack in her father; Heart disease in her other; Hypertension in her other. Allergies  Allergen Reactions  . Dextromethorphan Rash     Review of Systems  Constitutional: Negative for fatigue and unexpected weight change.  Eyes: Negative for visual disturbance.  Respiratory: Negative for cough, chest tightness, shortness of breath and wheezing.   Cardiovascular: Positive for leg swelling. Negative for chest pain and palpitations.  Endocrine: Negative for polydipsia and polyuria.  Neurological: Negative for dizziness, seizures, syncope, weakness, light-headedness and headaches.       Objective:   Physical Exam  Constitutional: She appears well-developed and well-nourished.  Cardiovascular: Normal rate and regular rhythm.  Pulmonary/Chest: Effort normal and breath sounds normal.  Musculoskeletal: She exhibits edema.  Trace edema feet ankles legs bilaterally. No pitting edema.  Skin: No rash noted.       Assessment:     #1 One-week history of bilateral leg  edema. History of known diastolic dysfunction. No respiratory distress. Suspect component of venous stasis as well. She is also on amlodipine 5 mg daily which could be contributing.  #2 hypertension-stable.  #3 chronic kidney disease.    Plan:     -Elevate legs frequently -Check basic metabolic panel -Continue Lasix 40 mg twice a day through the weekend and then try dropping back to her usual dose -Discussed consideration for venous compression  Eulas Post MD [facility]

## 2017-10-27 ENCOUNTER — Other Ambulatory Visit: Payer: Self-pay

## 2017-11-01 ENCOUNTER — Telehealth: Payer: Self-pay | Admitting: Cardiovascular Disease

## 2017-11-01 NOTE — Telephone Encounter (Signed)
Can we see how much Lasix she has been taking? If she has been on 40 mg po BID, I would have her increased to 80 mg am and 40 mg pm for three days and follow weights. If she has been on 20 mg po BID, have her increase to 40 mg po BID for three days. She has normal LV systolic function but she does have diastolic dysfunction.   Thanks, chris

## 2017-11-01 NOTE — Telephone Encounter (Signed)
Patient called with concerns about unresolved leg, ankle and foot swelling. The patient stated that her feet are hurting in response to the increased swelling. The says the symptoms started to be a problem 9 days ago and stated she was trying to diet. The patient has no other symptoms. The patient weighs herself daily and she stated that her weight has increased an average of 1lb a day. The patient did not provide exact weights. Advised patient if symptoms worsen to call back. Forwarding to Dr. Angelena Form for recommendations.

## 2017-11-01 NOTE — Telephone Encounter (Signed)
New message   Pt c/o swelling: STAT is pt has developed SOB within 24 hours  1) How much weight have you gained and in what time span?   2) If swelling, where is the swelling located? LEGS, ANKLES, FEET  3) Are you currently taking a fluid pill? YES  4) Are you currently SOB? NO  5) Do you have a log of your daily weights (if so, list)? 166  6) Have you gained 3 pounds in a day or 5 pounds in a week?   7) Have you traveled recently? NO

## 2017-11-01 NOTE — Telephone Encounter (Signed)
Follow up  ° ° °Patient is returning call.  °

## 2017-11-01 NOTE — Telephone Encounter (Signed)
Left message for patient to call back  

## 2017-11-01 NOTE — Telephone Encounter (Signed)
Called patient back. Informed her of Dr. Camillia Herter recommendation. Patient has been taking Lasix 40 mg BID. Informed patient to take Lasix 80 mg in AM and 40- mg in the PM for three days. Informed patient to keep her f/u appointment next week with Melina Copa and weigh herself daily. Informed patient to call our office if she gains 3 lbs in 24 hours or 5 lbs in 1 week. Patient verbalized understanding.

## 2017-11-10 ENCOUNTER — Ambulatory Visit: Payer: Medicare Other | Admitting: Physician Assistant

## 2017-11-10 DIAGNOSIS — I10 Essential (primary) hypertension: Secondary | ICD-10-CM | POA: Diagnosis not present

## 2017-11-10 DIAGNOSIS — M47816 Spondylosis without myelopathy or radiculopathy, lumbar region: Secondary | ICD-10-CM | POA: Diagnosis not present

## 2017-11-10 DIAGNOSIS — Z6829 Body mass index (BMI) 29.0-29.9, adult: Secondary | ICD-10-CM | POA: Diagnosis not present

## 2017-11-12 DIAGNOSIS — M25551 Pain in right hip: Secondary | ICD-10-CM | POA: Diagnosis not present

## 2017-11-15 DIAGNOSIS — Z79899 Other long term (current) drug therapy: Secondary | ICD-10-CM | POA: Diagnosis not present

## 2017-11-15 DIAGNOSIS — M545 Low back pain: Secondary | ICD-10-CM | POA: Diagnosis not present

## 2017-11-15 DIAGNOSIS — M316 Other giant cell arteritis: Secondary | ICD-10-CM | POA: Diagnosis not present

## 2017-11-15 DIAGNOSIS — R001 Bradycardia, unspecified: Secondary | ICD-10-CM | POA: Diagnosis not present

## 2017-11-18 ENCOUNTER — Other Ambulatory Visit: Payer: Self-pay | Admitting: Family Medicine

## 2017-11-18 NOTE — Telephone Encounter (Signed)
Last Rx given on 6/26 #10 with no ref

## 2017-11-19 DIAGNOSIS — M5416 Radiculopathy, lumbar region: Secondary | ICD-10-CM | POA: Diagnosis not present

## 2017-11-20 NOTE — Telephone Encounter (Signed)
Refill once 

## 2017-11-22 NOTE — Telephone Encounter (Signed)
Rx done. 

## 2017-12-02 ENCOUNTER — Other Ambulatory Visit: Payer: Self-pay | Admitting: Family Medicine

## 2017-12-03 NOTE — Telephone Encounter (Signed)
Last refill given on 8/26 #10 with no ref

## 2017-12-06 ENCOUNTER — Telehealth: Payer: Self-pay | Admitting: Family Medicine

## 2017-12-06 NOTE — Telephone Encounter (Signed)
I recommend she not take regularly- we have discussed many times in past.  Would only take for severe insomnia.  Would leave at #10.

## 2017-12-06 NOTE — Telephone Encounter (Signed)
#  10 called in.  Okay to call in #30?

## 2017-12-06 NOTE — Telephone Encounter (Signed)
Refill once 

## 2017-12-06 NOTE — Telephone Encounter (Signed)
Patient is requesting a refill on Tamazepam 15 mg.  Patient is requesting 30 count.  Patient is out of medication, states when she doesn't sleep she gets A-fib.  Pharmacy: [facility]

## 2017-12-06 NOTE — Telephone Encounter (Signed)
Rx done. 

## 2017-12-07 NOTE — Telephone Encounter (Signed)
Left a detailed message on verified voice mail.   

## 2017-12-08 ENCOUNTER — Encounter: Payer: Self-pay | Admitting: Medical

## 2017-12-08 ENCOUNTER — Ambulatory Visit (INDEPENDENT_AMBULATORY_CARE_PROVIDER_SITE_OTHER): Payer: Medicare Other | Admitting: Medical

## 2017-12-08 VITALS — BP 132/72 | HR 58 | Ht 64.0 in | Wt 167.0 lb

## 2017-12-08 DIAGNOSIS — I5032 Chronic diastolic (congestive) heart failure: Secondary | ICD-10-CM

## 2017-12-08 DIAGNOSIS — I480 Paroxysmal atrial fibrillation: Secondary | ICD-10-CM

## 2017-12-08 DIAGNOSIS — E785 Hyperlipidemia, unspecified: Secondary | ICD-10-CM

## 2017-12-08 DIAGNOSIS — I10 Essential (primary) hypertension: Secondary | ICD-10-CM

## 2017-12-08 DIAGNOSIS — I2510 Atherosclerotic heart disease of native coronary artery without angina pectoris: Secondary | ICD-10-CM

## 2017-12-08 DIAGNOSIS — I25118 Atherosclerotic heart disease of native coronary artery with other forms of angina pectoris: Secondary | ICD-10-CM | POA: Diagnosis not present

## 2017-12-08 NOTE — Patient Instructions (Signed)
Your physician recommends that you continue on your current medications as directed. Please refer to the Current Medication list given to you today.  Your physician recommends that you return for lab work in: today (BMET, [place])  Your physician recommends that you schedule a follow-up appointment in: 3 months with Dr. Thea Gist below.

## 2017-12-08 NOTE — Progress Notes (Signed)
Cardiology Office Note:    Date:  12/08/2017   ID:  Betty Jackson, DOB 1934-05-01, MRN [id number]  PCP:  Eulas Post, MD  Cardiologist:  [name], MD   Referring MD: Eulas Post, MD   Chief Complaint: follow-up of CAD and CHF  History of Present Illness:    Betty Jackson is a 83 y.o. female with a PMH of CAD s/p stent to RCA in 2005 (patent on last cath in 2014), chronic diastolic CHF, HTN, HLD, paroxysmal atrial fibrillation on eliquis, PVCs/PACs, GERD, RA and temporal arteritis. History well outlined by Dr. Angelena Form. Per his note, she had a stent placed in the RCA in [place], [place] in 2005. She moved to [facility] for treatment of her temporal arteritis at [facility]. She had normal ABIs in 2012. She had a possible CVA in 2014 and was placed on Plavix but later it was felt that this could have been due to her temporal arteritis. Mild carotid disease by dopplers 2014. Cardiac cath 12/09/12 with patent RCA stent, mild disease in LAD and Circumflex, normal filling pressures. Echo August 2014 with normal LV size and function, no significant valve issues. She was admitted to [facility] in February 2019 with influenza A, pneumonia and sepsis. Her beta blocker was held and she developed atrial fibrillation with RVR. She also had frequent PVCs and PACs. She converted back to sinus after several hours. Echo February 2019 with normal LV size and function, grade 2 diastolic dysfunction, mild AI. She was not discharged on anticoagulation. She wore a cardiac monitor and had recurrent atrial fib. CHADS VASC score 4. She was seen by Estella Husk, PA 07/06/17 and she was started on Eliquis. Seen by Dr. Angelena Form May 2019 and she c/o chest pain with exertion. Nuclear stress test 08/31/17 with no ischemia. She was having minor nose bleeds on Eliquis. She was referred to ENT but she did not go to that appointment and symptoms resolved.   She presents today for follow-up of her CHF and CAD. She has been struggling with  LE edema for the past several months with several adjustments to her lasix dose to manage symptoms. She is currently taking lasix 40mg  BID. She reports occasional fleeting chest pain which is not bothersome to her, lasts for seconds, and resolves spontaneously. She has chronic DOE but feels like it is taking less activity to bring on symptoms in the past 1-2 months. She asked about ways to limit fluid buildup and we discussed salt and fluid intake. She states she drinks more than 64 oz per day due to vocal cord dysfunction and need to drink fluids to minimize coughing. She is disappointed with the amount of weight gain she has had since being on prednisone. She has noticed a pocket of fluid in her left neck/clavical area in the past 1-2 months which is not tender to touch. She denies problems with bleeding, orthopnea, PND, lightheadedness, or syncope.    Past Medical History:  Diagnosis Date  . Abdominal pain, epigastric 06/22/2013  . Abdominal pain, other specified site 12/24/2011  . Abnormal urinalysis 05/20/2017  . Acute respiratory failure with hypoxia ([place]) 05/20/2017  . Anemia, unspecified 10/28/2012  . Anxiety state, unspecified 10/28/2012  . Bruises easily    d/t being on prednisone and plavix  . CAD (coronary artery disease)    a. Stent RCA in [facility];  b. Cath approx 2009 - nonobs per pt report.  . Cataract    immature on the left  eye  . Chronic insomnia 02/07/2013  . Chronic lower back pain    scoliosis  . CKD (chronic kidney disease) stage 3, GFR 30-59 ml/min (HCC)   . Complication of anesthesia    pt has a very high tolerance to meds  . Coronary disease 05/20/2017  . CVA (cerebral infarction) 10/29/2012  . DDD (degenerative disc disease)   . Depression   . Diverticulitis    hx of  . Diverticulosis   . Elevated transaminase level 02/07/2013  . Enteritis   . GERD (gastroesophageal reflux disease) 09/01/2010  . Giant cell arteritis ([place])   . Hematuria 01/24/2012  .  Hemorrhoids   . Herniated nucleus pulposus, L5-S1, right 11/04/2015  . History of atrial fibrillation 09/01/2010  . History of scabies   . HTN (hypertension) 05/20/2017  . Hyperlipidemia    takes Lipitor daily  . Hypertension    takes Amlodipine,Losartan,Metoprolol,and HCTZ daily  . Hyponatremia 01/24/2012  . Incontinence of urine   . Influenza A 05/20/2017  . Insomnia    takes Restoril nightly  . Joint pain   . Leg pain, right 02/18/2011  . Lumbar stenosis 04/25/2013  . Major depressive disorder, recurrent episode, moderate ([place]) 07/16/2013  . Migraines    "back in my 20's; none since" (10/28/2012)  . Nonspecific (abnormal) findings on radiological and other examination of gastrointestinal tract 01/25/2012  . Osteopenia   . Osteoporosis   . PAF (paroxysmal atrial fibrillation) ([place]) 2011   a. lone epidode in 2011 according to notes.  . Rheumatoid arthritis ([place])   . Scoliosis   . Sepsis ([place]) 05/20/2017  . Sinus bradycardia    a. on chronic bb  . Temporal arteritis ([place]) 2011   a. followed @ [facility]; potential flareup 10/28/2012/notes 10/28/2012  . Urinary frequency   . Visual changes 10/28/2012  . Vocal cord dysfunction    "they don't operate properly" (10/28/2012)    Past Surgical History:  Procedure Laterality Date  . ABDOMINAL HYSTERECTOMY  ~ 1984   vaginally  . BACK SURGERY  7-77yrs ago   X Stop  . BLADDER SUSPENSION  2001  . BREAST BIOPSY Right   . CATARACT EXTRACTION W/ INTRAOCULAR LENS IMPLANT Right ~ 08/2012  . COLONOSCOPY  01/26/2012   Procedure: COLONOSCOPY;  Surgeon: Ladene Artist, MD,FACG;  Location: [facility] ENDOSCOPY;  Service: Endoscopy;  Laterality: N/A;  note the EGD is possible  . CORONARY ANGIOPLASTY WITH STENT PLACEMENT  2006   X 1 stent  . EPIDURAL BLOCK INJECTION    . ESOPHAGOGASTRODUODENOSCOPY  01/26/2012   Procedure: ESOPHAGOGASTRODUODENOSCOPY (EGD);  Surgeon: Ladene Artist, MD,FACG;  Location: [facility] ENDOSCOPY;  Service: Endoscopy;  Laterality: N/A;  .  HEMIARTHROPLASTY HIP Right 2012  . LAPAROSCOPIC CHOLECYSTECTOMY  2001  . LUMBAR FUSION  03/2013  . LUMBAR LAMINECTOMY/DECOMPRESSION MICRODISCECTOMY Right 11/04/2015   Procedure: Right Lumbar Five-Sacral One Microdiskectomy;  Surgeon: Kristeen Miss, MD;  Location: [facility] NEURO ORS;  Service: Neurosurgery;  Laterality: Right;  Right L5-S1 Microdiskectomy  . moles removed that required stiches     one on leg and one on face  . TEMPORAL ARTERY BIOPSY / LIGATION Bilateral 2011  . TONSILLECTOMY AND ADENOIDECTOMY     at age 89  . TUBAL LIGATION  ~ 1982  . X-STOP IMPLANTATION  ~ 2010   "lower back" (10/28/2012)    Current Medications: Current Meds  Medication Sig  . amLODipine (NORVASC) 5 MG tablet TAKE 1 TABLET (5 MG TOTAL) BY MOUTH DAILY.  Marland Kitchen apixaban (ELIQUIS) 5  MG TABS tablet Take 1 tablet (5 mg total) by mouth 2 (two) times daily.  . calcium carbonate (OS-CAL) 600 MG TABS Take 600 mg by mouth daily.  . cholecalciferol (VITAMIN D) 1000 UNITS tablet Take 1,000 Units by mouth daily.   Marland Kitchen escitalopram (LEXAPRO) 10 MG tablet TAKE 1 TABLET BY MOUTH EVERY DAY  . furosemide (LASIX) 20 MG tablet Take 40 mg by mouth 2 (two) times daily.   Marland Kitchen losartan (COZAAR) 100 MG tablet TAKE 1 TABLET BY MOUTH EVERY DAY  . magnesium oxide (MAG-OX) 400 MG tablet Take 1 tablet (400 mg total) by mouth 2 (two) times daily.  . metoprolol tartrate (LOPRESSOR) 50 MG tablet Take 1.5 tablets (75 mg total) by mouth 2 (two) times daily. Take one extra tab as needed for rapid heart rate over 100.  Marland Kitchen potassium chloride SA (K-DUR,KLOR-CON) 20 MEQ tablet Take 1 tablet (20 mEq total) by mouth daily.  . predniSONE (DELTASONE) 10 MG tablet 4 tablets for 3 days, 3 tablets for 3 days, 2 tablets for 3 days, 1 tablet continue  . rosuvastatin (CRESTOR) 10 MG tablet Take 1 tablet (10 mg total) by mouth daily.  . temazepam (RESTORIL) 15 MG capsule TAKE ONE CAPSULE BY MOUTH AT BEDTIME  . vitamin C (ASCORBIC ACID) 500 MG tablet Take 1,000 mg by mouth  daily.   . vitamin E (VITAMIN E) 400 UNIT capsule Take 400 Units by mouth daily.   Current Facility-Administered Medications for the 12/08/17 encounter (Office Visit) with Abigail Butts., PA-C  Medication  . 0.9 %  sodium chloride infusion     Allergies:   Dextromethorphan   Social History   Socioeconomic History  . Marital status: Married    Spouse name: Not on file  . Number of children: 3  . Years of education: Not on file  . Highest education level: Not on file  Occupational History  . Occupation: Retired Development worker, community  . Financial resource strain: Not on file  . Food insecurity:    Worry: Not on file    Inability: Not on file  . Transportation needs:    Medical: Not on file    Non-medical: Not on file  Tobacco Use  . Smoking status: Never Smoker  . Smokeless tobacco: Never Used  Substance and Sexual Activity  . Alcohol use: Yes    Comment: socially  . Drug use: No  . Sexual activity: Not Currently    Birth control/protection: Surgical  Lifestyle  . Physical activity:    Days per week: Not on file    Minutes per session: Not on file  . Stress: Not on file  Relationships  . Social connections:    Talks on phone: Not on file    Gets together: Not on file    Attends religious service: Not on file    Active member of club or organization: Not on file    Attends meetings of clubs or organizations: Not on file    Relationship status: Not on file  Other Topics Concern  . Not on file  Social History Narrative   Lives in [place] with her husband.  Retired Cabin crew.     Family History: The patient's family history includes Brain cancer (age of onset: 62) in her mother; Breast cancer (age of onset: 45) in her daughter; Heart attack in her father; Heart disease in her other; Hypertension in her other. There is no history of Arthritis, Colon cancer, or Osteoporosis.  ROS:  Please see the history of present illness.    All other systems reviewed and  are negative.  EKGs/Labs/Other Studies Reviewed:    The following studies were reviewed today:  Cardiac cath 12/09/12: Ao: 150/61  LV: 155/13/18  RA: 6  RV: 25/5/8  PA: 24/10 (mean 16)  PCWP: 9  Fick Cardiac Output: 3.9 L/min  Fick Cardiac Index: 2.3 L/min/m2  Central Aortic Saturation: 98%  Pulmonary Artery Saturation: 69%  Angiographic Findings:  Left main: 10% distal stenosis.  Left Anterior Descending Artery: Large caliber vessel that courses to the apex. The proximal and mid vessel has moderate calcification. The mid vessel has a long 20% stenosis. There are two small caliber diagonal branches with mild plaque disease.  Circumflex Artery: Large caliber vessel with two small caliber obtuse marginal branches. No obstructive disease.  Right Coronary Artery: Large caliber, dominant vessel with a patent stent in the mid vessel. There is minimal stent restenosis. Just beyond the stent there is a 20% stenosis.  Left Ventricular Angiogram: LVEF=65%  Impression:  1. Stable single vessel CAD with patent mid RCA stent  2. Normal LV systolic function  3. Normal PA pressures  Echo February 2019: Left ventricle: The cavity size was normal. Systolic function was normal. The estimated ejection fraction was in the range of 60% to 65%. Wall motion was normal; there were no regional wall motion abnormalities. Features are consistent with a pseudonormal left ventricular filling pattern, with concomitant abnormal relaxation and increased filling pressure (grade 2 diastolic dysfunction). - Aortic valve: There was mild regurgitation. - Mitral valve: Calcified annulus. - Left atrium: The atrium was moderately dilated. - Pulmonary arteries: PA peak pressure: 31 mm Hg (S). - Pericardium, extracardiac: There was a left pleural effusion.  NST 08/2017:  Nuclear stress EF: 69%.  There was no ST segment deviation noted during stress.  The study is normal.  This is a low risk  study.  The left ventricular ejection fraction is hyperdynamic (>65%).   Normal pharmacologic nuclear stress test with no evidence for prior infarct or ischemia.  Hyperdynamic LVEF.  EKG:  EKG is ordered today and demonstrates sinus bradycardia (rate 58), non-ischemic, QTC 418.    Recent Labs: 05/21/2017: Hemoglobin 11.6; Platelets 153 05/25/2017: TSH 0.302 08/18/2017: ALT 31 09/20/2017: Magnesium 2.1 10/22/2017: BUN 18; Creatinine, Ser 1.18; Potassium 4.3; Sodium 141  Recent Lipid Panel    Component Value Date/Time   CHOL 155 08/18/2017 1241   TRIG 130 08/18/2017 1241   HDL 78 08/18/2017 1241   CHOLHDL 2.0 08/18/2017 1241   CHOLHDL 2 11/13/2013 1403   VLDL 13.0 11/13/2013 1403   LDLCALC 51 08/18/2017 1241    Physical Exam:    VS:  BP 132/72   Pulse (!) 58   Ht 5\' 4"  (1.626 m)   Wt 167 lb (75.8 kg)   BMI 28.67 kg/m     Wt Readings from Last 3 Encounters:  12/08/17 167 lb (75.8 kg)  10/22/17 164 lb 6 oz (74.6 kg)  09/20/17 171 lb (77.6 kg)     GEN: Well nourished, well developed female who appears younger than stated age, in no acute distress HEENT: sclera anicteric; fluctuant edema of the left lower neck/clavical area - not TTP NECK: No JVD; No carotid bruits LYMPHATICS: No lymphadenopathy CARDIAC: RRR, no murmurs, rubs, gallops RESPIRATORY: Clear to auscultation without rales, wheezing or rhonchi  ABDOMEN: Soft, non-tender, non-distended MUSCULOSKELETAL:  Mild non-pitting ankle edema; No deformity  SKIN: Warm and dry NEUROLOGIC:  Alert and  oriented x 3 PSYCHIATRIC:  Normal affect   ASSESSMENT/PLAN:    1. Chronic diastolic CHF: has had worsening LE edema for several months now with frequent titration of her lasix dose. No pitting edema on exam today and lungs are clear.  - Continue metoprolol, losartan, and lasix 40mg  BID - Will check a BMET today to monitor Cr and electrolytes - Educated on importance of maintaining a low salt diet (<2g per day), and limiting  fluid intake (<64 oz per day) - Encouraged daily weight monitoring and to notify the office if her weight increases by 3lbs in 24 hours or 5lbs in one week.   2. CAD s/p stent to RCA: no significant anginal complaints. NST in 08/2017 without ischemia. Not on ASA due need for eliquis.  - Continue statin, amlodipine, and metoprolol   3. Paroxysmal atrial fibrillation: in sinus rhythm today. Doing well with anticoagulation.  - Continue metoprolol and eliquis  4. HTN: BP 132/72 today - Continue amlodipine, losartan, metoprolol, and   5. HLD: LDL well controlled at 51 07/2017 - Continue statin and routine annual monitoring  6. Swelling of left neck/clavical area: fluctuant, non-tender. Does not appear to be cardiac related.  - Recommend outpatient follow-up with PCP.    Medication Adjustments/Labs and Tests Ordered: Current medicines are reviewed at length with the patient today.  Concerns regarding medicines are outlined above.  No orders of the defined types were placed in this encounter.  No orders of the defined types were placed in this encounter.   There are no Patient Instructions on file for this visit.   Signed, Abigail Butts, PA-C  12/08/2017 3:15 PM    [facility]

## 2017-12-09 ENCOUNTER — Telehealth: Payer: Self-pay

## 2017-12-09 LAB — BASIC METABOLIC PANEL
BUN/Creatinine Ratio: 28 (ref 12–28)
BUN: 28 mg/dL — AB (ref 8–27)
CALCIUM: 9.2 mg/dL (ref 8.7–10.3)
CO2: 29 mmol/L (ref 20–29)
CREATININE: 0.99 mg/dL (ref 0.57–1.00)
Chloride: 96 mmol/L (ref 96–106)
GFR calc Af Amer: 62 mL/min/{1.73_m2} (ref >59)
GFR calc non Af Amer: 54 mL/min/{1.73_m2} — ABNORMAL LOW (ref >59)
GLUCOSE: 96 mg/dL (ref 65–99)
Potassium: 4.8 mmol/L (ref 3.5–5.2)
SODIUM: 141 mmol/L (ref 134–144)

## 2017-12-09 LAB — MAGNESIUM: MAGNESIUM: 2.3 mg/dL (ref 1.6–2.3)

## 2017-12-09 NOTE — Telephone Encounter (Signed)
Notes recorded by Frederik Schmidt, RN on 12/09/2017 at 10:53 AM EDT Informed patient of results/recommendations. She verbalized understanding. ------

## 2018-01-05 ENCOUNTER — Other Ambulatory Visit: Payer: Self-pay | Admitting: Family Medicine

## 2018-01-05 NOTE — Telephone Encounter (Signed)
Decline.  We have discussed trying to avoid to reduce risk of falls.  Would rather she try Melatonin- if she hasn't recently.

## 2018-01-05 NOTE — Telephone Encounter (Signed)
Called patient and left a detailed voice message about the message from Dr. Elease Hashimoto.

## 2018-01-05 NOTE — Telephone Encounter (Signed)
Last OV 10/22/17, No future OV  Note on last filled stated that Not to exceed 5 additional refills before 05/21/2018. This will make over the 5th refill mark for the year.   Please advise if okay to refill?

## 2018-01-06 ENCOUNTER — Encounter: Payer: Self-pay | Admitting: Cardiovascular Disease

## 2018-01-06 ENCOUNTER — Ambulatory Visit (INDEPENDENT_AMBULATORY_CARE_PROVIDER_SITE_OTHER): Payer: Medicare Other | Admitting: Cardiovascular Disease

## 2018-01-06 VITALS — BP 100/50 | HR 69 | Ht 64.0 in | Wt 166.0 lb

## 2018-01-06 DIAGNOSIS — I5033 Acute on chronic diastolic (congestive) heart failure: Secondary | ICD-10-CM | POA: Diagnosis not present

## 2018-01-06 DIAGNOSIS — E785 Hyperlipidemia, unspecified: Secondary | ICD-10-CM

## 2018-01-06 DIAGNOSIS — I2510 Atherosclerotic heart disease of native coronary artery without angina pectoris: Secondary | ICD-10-CM | POA: Diagnosis not present

## 2018-01-06 DIAGNOSIS — I10 Essential (primary) hypertension: Secondary | ICD-10-CM

## 2018-01-06 DIAGNOSIS — I25118 Atherosclerotic heart disease of native coronary artery with other forms of angina pectoris: Secondary | ICD-10-CM

## 2018-01-06 DIAGNOSIS — I480 Paroxysmal atrial fibrillation: Secondary | ICD-10-CM

## 2018-01-06 NOTE — Patient Instructions (Signed)
Medication Instructions:  Your physician has recommended you make the following change in your medication: Stop amlodipine  If you need a refill on your cardiac medications before your next appointment, please call your pharmacy.   Lab work: none If you have labs (blood work) drawn today and your tests are completely normal, you will receive your results only by: Marland Kitchen MyChart Message (if you have MyChart) OR . A paper copy in the mail If you have any lab test that is abnormal or we need to change your treatment, we will call you to review the results.  Testing/Procedures: none  Follow-Up: At [facility], you and your health needs are our priority.  As part of our continuing mission to provide you with exceptional heart care, we have created designated Provider Care Teams.  These Care Teams include your primary Cardiologist (physician) and Advanced Practice Providers (APPs -  Physician Assistants and Nurse Practitioners) who all work together to provide you with the care you need, when you need it. You will need a follow up appointment in 6 months.  Please call our office 2 months in advance to schedule this appointment.  You may see [name], MD or one of the following Advanced Practice Providers on your designated Care Team:   Anderson, PA-C Melina Copa, PA-C . Ermalinda Barrios, PA-C  Any Other Special Instructions Will Be Listed Below (If Applicable).

## 2018-01-06 NOTE — Progress Notes (Signed)
Chief Complaint  Patient presents with  . Follow-up    CAD   History of Present Illness: 83 yo female with history of paroxysmal atrial fibrillation, PVCs, PACs, CAD s/p stent RCA, HTN, HLD, GERD, temporal arteritis here today for cardiac follow up. She had a stent placed in the RCA in [place], [place] in 2005. She moved to [facility] for treatment of her temporal arteritis at [facility] and to be near family. She had normal ABI in 2012. Mild carotid disease by dopplers 2014. Cardiac cath 12/09/12 with patent RCA stent, mild disease LAD and Circumflex, normal filling pressures. Echo August 2014 with normal LV size and function, no significant valve issues. She was seen in our office 06/19/14 with c/o chest pain. Stress myoview 06/29/14 with no ischemia. She was admitted to [facility] in February 2019 with influenza A, pneumonia and sepsis. Her beta blocker was held and she developed atrial fibrillation with RVR. She also had frequent PVCs and PACs. She converted back to sinus after several hours. Echo February 2019 with normal LV size and function, grade 2 diastolic dysfunction, mild AI. She was not discharged on anticoagulation. She wore a cardiac monitor and had recurrent atrial fib. CHADS VASC score 4. She was seen by Estella Husk, PA 07/06/17 and she was started on Eliquis. I saw her in May 2019 and she c/o chest pain with exertion. Nuclear stress test 08/31/17 with no ischemia. She was having minor nose bleeds on Eliquis. She was referred to ENT but she did not go to that appointment. I saw her 09/20/17 and she c/o weight gain and worsened LE edema. Lasix was increased and Norvasc was stopped.   She is here today for follow up. The patient denies any chest pain, dyspnea, palpitations, lower extremity edema, orthopnea, PND, dizziness, near syncope or syncope. Overall feeling well.     Primary Care Physician: Eulas Post, MD   Past Medical History:  Diagnosis Date  . Abdominal pain, epigastric 06/22/2013  . Abdominal  pain, other specified site 12/24/2011  . Abnormal urinalysis 05/20/2017  . Acute respiratory failure with hypoxia ([place]) 05/20/2017  . Anemia, unspecified 10/28/2012  . Anxiety state, unspecified 10/28/2012  . Bruises easily    d/t being on prednisone and plavix  . CAD (coronary artery disease)    a. Stent RCA in [facility];  b. Cath approx 2009 - nonobs per pt report.  . Cataract    immature on the left eye  . Chronic insomnia 02/07/2013  . Chronic lower back pain    scoliosis  . CKD (chronic kidney disease) stage 3, GFR 30-59 ml/min (HCC)   . Complication of anesthesia    pt has a very high tolerance to meds  . Coronary disease 05/20/2017  . CVA (cerebral infarction) 10/29/2012  . DDD (degenerative disc disease)   . Depression   . Diverticulitis    hx of  . Diverticulosis   . Elevated transaminase level 02/07/2013  . Enteritis   . GERD (gastroesophageal reflux disease) 09/01/2010  . Giant cell arteritis ([place])   . Hemorrhoids   . Herniated nucleus pulposus, L5-S1, right 11/04/2015  . History of scabies   . HTN (hypertension) 05/20/2017  . Hyperlipidemia    takes Lipitor daily  . Hypertension    takes Amlodipine,Losartan,Metoprolol,and HCTZ daily  . Incontinence of urine   . Influenza A 05/20/2017  . Insomnia    takes Restoril nightly  . Lumbar stenosis 04/25/2013  . Major depressive disorder, recurrent episode, moderate (  [place]) 07/16/2013  . Migraines    "back in my 20's; none since" (10/28/2012)  . Osteoporosis   . PAF (paroxysmal atrial fibrillation) ([place]) 2011   a. lone epidode in 2011 according to notes.  . Rheumatoid arthritis ([place])   . Scoliosis   . Sepsis ([place]) 05/20/2017  . Sinus bradycardia    a. on chronic bb  . Temporal arteritis ([place]) 2011   a. followed @ [facility]; potential flareup 10/28/2012/notes 10/28/2012  . Vocal cord dysfunction    "they don't operate properly" (10/28/2012)    Past Surgical History:  Procedure Laterality Date  . ABDOMINAL HYSTERECTOMY  ~ 1984    vaginally  . BACK SURGERY  7-74yrs ago   X Stop  . BLADDER SUSPENSION  2001  . BREAST BIOPSY Right   . CATARACT EXTRACTION W/ INTRAOCULAR LENS IMPLANT Right ~ 08/2012  . COLONOSCOPY  01/26/2012   Procedure: COLONOSCOPY;  Surgeon: Ladene Artist, MD,FACG;  Location: [facility] ENDOSCOPY;  Service: Endoscopy;  Laterality: N/A;  note the EGD is possible  . CORONARY ANGIOPLASTY WITH STENT PLACEMENT  2006   X 1 stent  . EPIDURAL BLOCK INJECTION    . ESOPHAGOGASTRODUODENOSCOPY  01/26/2012   Procedure: ESOPHAGOGASTRODUODENOSCOPY (EGD);  Surgeon: Ladene Artist, MD,FACG;  Location: [facility] ENDOSCOPY;  Service: Endoscopy;  Laterality: N/A;  . HEMIARTHROPLASTY HIP Right 2012  . LAPAROSCOPIC CHOLECYSTECTOMY  2001  . LUMBAR FUSION  03/2013  . LUMBAR LAMINECTOMY/DECOMPRESSION MICRODISCECTOMY Right 11/04/2015   Procedure: Right Lumbar Five-Sacral One Microdiskectomy;  Surgeon: Kristeen Miss, MD;  Location: [facility];  Service: Neurosurgery;  Laterality: Right;  Right L5-S1 Microdiskectomy  . moles removed that required stiches     one on leg and one on face  . TEMPORAL ARTERY BIOPSY / LIGATION Bilateral 2011  . TONSILLECTOMY AND ADENOIDECTOMY     at age 49  . TUBAL LIGATION  ~ 1982  . X-STOP IMPLANTATION  ~ 2010   "lower back" (10/28/2012)    Current Outpatient Medications  Medication Sig Dispense Refill  . apixaban (ELIQUIS) 5 MG TABS tablet Take 1 tablet (5 mg total) by mouth 2 (two) times daily. 60 tablet 11  . calcium carbonate (OS-CAL) 600 MG TABS Take 600 mg by mouth daily.    . cholecalciferol (VITAMIN D) 1000 UNITS tablet Take 1,000 Units by mouth daily.     Marland Kitchen escitalopram (LEXAPRO) 10 MG tablet TAKE 1 TABLET BY MOUTH EVERY DAY 90 tablet 0  . furosemide (LASIX) 20 MG tablet Take 40 mg by mouth 2 (two) times daily.     Marland Kitchen losartan (COZAAR) 100 MG tablet TAKE 1 TABLET BY MOUTH EVERY DAY 90 tablet 3  . magnesium oxide (MAG-OX) 400 MG tablet Take 1 tablet (400 mg total) by mouth 2 (two) times daily. 60  tablet 2  . metoprolol tartrate (LOPRESSOR) 50 MG tablet Take 1.5 tablets (75 mg total) by mouth 2 (two) times daily. Take one extra tab as needed for rapid heart rate over 100. 90 tablet 11  . potassium chloride SA (K-DUR,KLOR-CON) 20 MEQ tablet Take 1 tablet (20 mEq total) by mouth daily. 90 tablet 1  . predniSONE (DELTASONE) 10 MG tablet 4 tablets for 3 days, 3 tablets for 3 days, 2 tablets for 3 days, 1 tablet continue 90 tablet 1  . rosuvastatin (CRESTOR) 10 MG tablet Take 1 tablet (10 mg total) by mouth daily. 90 tablet 3  . temazepam (RESTORIL) 15 MG capsule TAKE ONE CAPSULE BY MOUTH AT BEDTIME 10 capsule 0  .  vitamin C (ASCORBIC ACID) 500 MG tablet Take 1,000 mg by mouth daily.     . vitamin E (VITAMIN E) 400 UNIT capsule Take 400 Units by mouth daily.     Current Facility-Administered Medications  Medication Dose Route Frequency Provider Last Rate Last Dose  . 0.9 %  sodium chloride infusion   Intravenous Once Eulas Post, MD        Allergies  Allergen Reactions  . Dextromethorphan Rash    Social History   Socioeconomic History  . Marital status: Married    Spouse name: Not on file  . Number of children: 3  . Years of education: Not on file  . Highest education level: Not on file  Occupational History  . Occupation: Retired Development worker, community  . Financial resource strain: Not on file  . Food insecurity:    Worry: Not on file    Inability: Not on file  . Transportation needs:    Medical: Not on file    Non-medical: Not on file  Tobacco Use  . Smoking status: Never Smoker  . Smokeless tobacco: Never Used  Substance and Sexual Activity  . Alcohol use: Yes    Comment: socially  . Drug use: No  . Sexual activity: Not Currently    Birth control/protection: Surgical  Lifestyle  . Physical activity:    Days per week: Not on file    Minutes per session: Not on file  . Stress: Not on file  Relationships  . Social connections:    Talks on phone: Not on  file    Gets together: Not on file    Attends religious service: Not on file    Active member of club or organization: Not on file    Attends meetings of clubs or organizations: Not on file    Relationship status: Not on file  . Intimate partner violence:    Fear of current or ex partner: Not on file    Emotionally abused: Not on file    Physically abused: Not on file    Forced sexual activity: Not on file  Other Topics Concern  . Not on file  Social History Narrative   Lives in [place] with her husband.  Retired Cabin crew.    Family History  Problem Relation Age of Onset  . Brain cancer Mother 35  . Heart attack Father   . Breast cancer Daughter 53  . Heart disease Other   . Hypertension Other   . Arthritis Neg Hx   . Colon cancer Neg Hx   . Osteoporosis Neg Hx     Review of Systems:  As stated in the HPI and otherwise negative.   BP (!) 100/50   Pulse 69   Ht 5\' 4"  (1.626 m)   Wt 166 lb (75.3 kg)   SpO2 95%   BMI 28.49 kg/m   Physical Examination:  General: Well developed, well nourished, NAD  HEENT: OP clear, mucus membranes moist  SKIN: warm, dry. No rashes. Neuro: No focal deficits  Musculoskeletal: Muscle strength 5/5 all ext  Psychiatric: Mood and affect normal  Neck: No JVD, no carotid bruits, no thyromegaly, no lymphadenopathy.  Lungs:Clear bilaterally, no wheezes, rhonci, crackles Cardiovascular: Regular rate and rhythm. No murmurs, gallops or rubs. Abdomen:Soft. Bowel sounds present. Non-tender.  Extremities: No lower extremity edema. Pulses are 2 + in the bilateral DP/PT.  Cardiac cath 12/09/12: Ao: 150/61  LV: 155/13/18  RA: 6  RV: 25/5/8  PA: 24/10 (mean 16)  PCWP: 9  Fick Cardiac Output: 3.9 L/min  Fick Cardiac Index: 2.3 L/min/m2  Central Aortic Saturation: 98%  Pulmonary Artery Saturation: 69%  Angiographic Findings:  Left main: 10% distal stenosis.  Left Anterior Descending Artery: Large caliber vessel that courses to the apex. The  proximal and mid vessel has moderate calcification. The mid vessel has a long 20% stenosis. There are two small caliber diagonal branches with mild plaque disease.  Circumflex Artery: Large caliber vessel with two small caliber obtuse marginal branches. No obstructive disease.  Right Coronary Artery: Large caliber, dominant vessel with a patent stent in the mid vessel. There is minimal stent restenosis. Just beyond the stent there is a 20% stenosis.  Left Ventricular Angiogram: LVEF=65%  Impression:  1. Stable single vessel CAD with patent mid RCA stent  2. Normal LV systolic function  3. Normal PA pressures  Echo February 2019: Left ventricle: The cavity size was normal. Systolic function was   normal. The estimated ejection fraction was in the range of 60%   to 65%. Wall motion was normal; there were no regional wall   motion abnormalities. Features are consistent with a pseudonormal   left ventricular filling pattern, with concomitant abnormal   relaxation and increased filling pressure (grade 2 diastolic   dysfunction). - Aortic valve: There was mild regurgitation. - Mitral valve: Calcified annulus. - Left atrium: The atrium was moderately dilated. - Pulmonary arteries: PA peak pressure: 31 mm Hg (S). - Pericardium, extracardiac: There was a left pleural effusion.  EKG:  EKG is not  ordered today. The ekg ordered today demonstrates  Recent Labs: 05/21/2017: Hemoglobin 11.6; Platelets 153 05/25/2017: TSH 0.302 08/18/2017: ALT 31 12/08/2017: BUN 28; Creatinine, Ser 0.99; Magnesium 2.3; Potassium 4.8; Sodium 141   Lipid Panel    Component Value Date/Time   CHOL 155 08/18/2017 1241   TRIG 130 08/18/2017 1241   HDL 78 08/18/2017 1241   CHOLHDL 2.0 08/18/2017 1241   CHOLHDL 2 11/13/2013 1403   VLDL 13.0 11/13/2013 1403   LDLCALC 51 08/18/2017 1241     Wt Readings from Last 3 Encounters:  01/06/18 166 lb (75.3 kg)  12/08/17 167 lb (75.8 kg)  10/22/17 164 lb 6 oz (74.6 kg)       Other studies Reviewed: Additional studies/ records that were reviewed today include: . Review of the above records demonstrates:    Assessment and Plan:   1. CAD with stable angina: She has a Taxus drug eluting stent in the RCA, placed in 2005 in [place]. Cath at [facility] September 2014 with patent RCA stent and mild disease LAD and circumflex. Stress test in June 2019 without ischemia. She has no chest pain. Will continue statin and beta blocker. No ASA since she is on Eliquis.   2. HTN: Her BP is low and has been over the past month. I will stop her norvasc.    3. Hyperlipidemia: Lipids well controlled. Continue statin.   4. PAF: She is in sinus today. Continue beta blocker and Eliquis.      5. Epistaxis: Improved. She does not wish to see ENT.  6. Acute on chronic diastolic CHF: Weight is stable. Continue Lasix. She uses 40 mg po BID on days when she has excess LE edema and 20 mg BID the other days.   Current medicines are reviewed at length with the patient today.  The patient does not have concerns regarding medicines.  The following changes have been made:  no change  Labs/ tests  ordered today include:   No orders of the defined types were placed in this encounter.   Disposition:   FU with me in 6 months  Signed, [name], MD 01/06/2018 4:50 PM    [facility] [address], [place], [place]  [address] Phone: [phone]; Fax: [phone]

## 2018-01-07 ENCOUNTER — Other Ambulatory Visit: Payer: Self-pay | Admitting: Family Medicine

## 2018-01-07 NOTE — Telephone Encounter (Signed)
Last OV 10/22/17, No future OV  Last filled 12/06/17, # 10 with 0 refills

## 2018-01-07 NOTE — Telephone Encounter (Signed)
Decline

## 2018-01-11 ENCOUNTER — Ambulatory Visit (INDEPENDENT_AMBULATORY_CARE_PROVIDER_SITE_OTHER): Payer: Medicare Other

## 2018-01-11 ENCOUNTER — Other Ambulatory Visit (INDEPENDENT_AMBULATORY_CARE_PROVIDER_SITE_OTHER): Payer: Medicare Other

## 2018-01-11 DIAGNOSIS — M316 Other giant cell arteritis: Secondary | ICD-10-CM

## 2018-01-11 DIAGNOSIS — Z23 Encounter for immunization: Secondary | ICD-10-CM | POA: Diagnosis not present

## 2018-01-11 LAB — SEDIMENTATION RATE: Sed Rate: 14 mm/hr (ref 0–30)

## 2018-01-11 LAB — C-REACTIVE PROTEIN: CRP: 0.1 mg/dL — AB (ref 0.5–20.0)

## 2018-01-15 ENCOUNTER — Emergency Department (HOSPITAL_BASED_OUTPATIENT_CLINIC_OR_DEPARTMENT_OTHER)
Admission: EM | Admit: 2018-01-15 | Discharge: 2018-01-15 | Disposition: A | Discharge status: Home / Self Care | Payer: Medicare Other | Attending: Emergency Medicine | Admitting: Emergency Medicine

## 2018-01-15 ENCOUNTER — Encounter (HOSPITAL_BASED_OUTPATIENT_CLINIC_OR_DEPARTMENT_OTHER): Payer: Self-pay | Admitting: Emergency Medicine

## 2018-01-15 ENCOUNTER — Emergency Department (HOSPITAL_BASED_OUTPATIENT_CLINIC_OR_DEPARTMENT_OTHER): Payer: Medicare Other

## 2018-01-15 ENCOUNTER — Other Ambulatory Visit: Payer: Self-pay

## 2018-01-15 DIAGNOSIS — I2510 Atherosclerotic heart disease of native coronary artery without angina pectoris: Secondary | ICD-10-CM | POA: Insufficient documentation

## 2018-01-15 DIAGNOSIS — M25552 Pain in left hip: Secondary | ICD-10-CM | POA: Diagnosis not present

## 2018-01-15 DIAGNOSIS — I129 Hypertensive chronic kidney disease with stage 1 through stage 4 chronic kidney disease, or unspecified chronic kidney disease: Secondary | ICD-10-CM | POA: Diagnosis not present

## 2018-01-15 DIAGNOSIS — Z7901 Long term (current) use of anticoagulants: Secondary | ICD-10-CM | POA: Insufficient documentation

## 2018-01-15 DIAGNOSIS — Z79899 Other long term (current) drug therapy: Secondary | ICD-10-CM | POA: Insufficient documentation

## 2018-01-15 DIAGNOSIS — N183 Chronic kidney disease, stage 3 (moderate): Secondary | ICD-10-CM | POA: Insufficient documentation

## 2018-01-15 DIAGNOSIS — Z96641 Presence of right artificial hip joint: Secondary | ICD-10-CM | POA: Diagnosis not present

## 2018-01-15 MED ORDER — HYDROCODONE-ACETAMINOPHEN 5-325 MG PO TABS: 1.0000 | ORAL_TABLET | ORAL | 0 refills | Status: DC | PRN

## 2018-01-15 NOTE — Discharge Instructions (Signed)
Use ice, rest and tylenol as primary treatment options. Follow-up with your primary doctor if no improvement in 1 week or worsening symptoms. For severe pain take norco or vicodin however realize they have the potential for addiction and it can make you sleepy and has tylenol in it.  No operating machinery while taking.

## 2018-01-15 NOTE — ED Notes (Signed)
Pt/family verbalized understanding of discharge instructions.   

## 2018-01-15 NOTE — ED Triage Notes (Signed)
Patient states that she dropped something about a week ago and bent over and started to have pain to her left hip - the patient states that it continues to hurt

## 2018-01-15 NOTE — ED Notes (Signed)
Pt ambulated to room from Xray using her own walker

## 2018-01-15 NOTE — ED Provider Notes (Signed)
[facility] EMERGENCY DEPARTMENT Provider Note   CSN: [id number] Arrival date & time: 01/15/18  1640     History   Chief Complaint Chief Complaint  Patient presents with  . Hip Pain    HPI Betty Jackson is a 83 y.o. female.  Patient with history of coronary artery disease, right hip replacement presents with left buttocks and hip pain for approximately 1 week since she bent over and started having pain in her left hip.  Patient has persistent pain since.  Patient has been walking on it as usual with a walker.  No direct trauma.  No fevers or chills.  No new back pain.     Past Medical History:  Diagnosis Date  . Abdominal pain, epigastric 06/22/2013  . Abdominal pain, other specified site 12/24/2011  . Abnormal urinalysis 05/20/2017  . Acute respiratory failure with hypoxia ([place]) 05/20/2017  . Anemia, unspecified 10/28/2012  . Anxiety state, unspecified 10/28/2012  . Bruises easily    d/t being on prednisone and plavix  . CAD (coronary artery disease)    a. Stent RCA in [facility];  b. Cath approx 2009 - nonobs per pt report.  . Cataract    immature on the left eye  . Chronic insomnia 02/07/2013  . Chronic lower back pain    scoliosis  . CKD (chronic kidney disease) stage 3, GFR 30-59 ml/min (HCC)   . Complication of anesthesia    pt has a very high tolerance to meds  . Coronary disease 05/20/2017  . CVA (cerebral infarction) 10/29/2012  . DDD (degenerative disc disease)   . Depression   . Diverticulitis    hx of  . Diverticulosis   . Elevated transaminase level 02/07/2013  . Enteritis   . GERD (gastroesophageal reflux disease) 09/01/2010  . Giant cell arteritis ([place])   . Hemorrhoids   . Herniated nucleus pulposus, L5-S1, right 11/04/2015  . History of scabies   . HTN (hypertension) 05/20/2017  . Hyperlipidemia    takes Lipitor daily  . Hypertension    takes Amlodipine,Losartan,Metoprolol,and HCTZ daily  . Incontinence of urine   . Influenza A  05/20/2017  . Insomnia    takes Restoril nightly  . Lumbar stenosis 04/25/2013  . Major depressive disorder, recurrent episode, moderate ([place]) 07/16/2013  . Migraines    "back in my 20's; none since" (10/28/2012)  . Osteoporosis   . PAF (paroxysmal atrial fibrillation) ([place]) 2011   a. lone epidode in 2011 according to notes.  . Rheumatoid arthritis ([place])   . Scoliosis   . Sepsis ([place]) 05/20/2017  . Sinus bradycardia    a. on chronic bb  . Temporal arteritis ([place]) 2011   a. followed @ [facility]; potential flareup 10/28/2012/notes 10/28/2012  . Vocal cord dysfunction    "they don't operate properly" (10/28/2012)    Patient Active Problem List   Diagnosis Date Noted  . Influenza A 05/20/2017  . Sepsis ([place]) 05/20/2017  . Acute respiratory failure with hypoxia ([place]) 05/20/2017  . CKD (chronic kidney disease) stage 3, GFR 30-59 ml/min (HCC) 05/20/2017  . PAF (paroxysmal atrial fibrillation) ([place]) 05/20/2017  . HTN (hypertension) 05/20/2017  . Giant cell arteritis ([place]) 05/20/2017  . Coronary disease 05/20/2017  . Abnormal urinalysis 05/20/2017  . Osteoporosis 05/08/2016  . Herniated nucleus pulposus, L5-S1, right 11/04/2015  . Major depressive disorder, recurrent episode, moderate ([place]) 07/16/2013  . Abdominal pain, epigastric 06/22/2013  . Lumbar stenosis 04/25/2013  . Chronic insomnia 02/07/2013  . Elevated transaminase  level 02/07/2013  . CVA (cerebral infarction) 10/29/2012  . Visual changes 10/28/2012  . Depression 10/28/2012  . Anxiety state, unspecified 10/28/2012  . Anemia, unspecified 10/28/2012  . Nonspecific (abnormal) findings on radiological and other examination of gastrointestinal tract 01/25/2012  . Hyponatremia 01/24/2012  . Hematuria 01/24/2012  . Enteritis 12/24/2011  . Abdominal pain, other specified site 12/24/2011  . Leg pain, right 02/18/2011  . Temporal arteritis ([place]) 09/01/2010  . Hypertension 09/01/2010  . Hyperlipidemia 09/01/2010  . History of atrial  fibrillation 09/01/2010  . CAD (coronary artery disease) 09/01/2010  . GERD (gastroesophageal reflux disease) 09/01/2010  . Insomnia 09/01/2010    Past Surgical History:  Procedure Laterality Date  . ABDOMINAL HYSTERECTOMY  ~ 1984   vaginally  . BACK SURGERY  7-56yrs ago   X Stop  . BLADDER SUSPENSION  2001  . BREAST BIOPSY Right   . CATARACT EXTRACTION W/ INTRAOCULAR LENS IMPLANT Right ~ 08/2012  . COLONOSCOPY  01/26/2012   Procedure: COLONOSCOPY;  Surgeon: Ladene Artist, MD,FACG;  Location[facility] ENDOSCOPY;  Service: Endoscopy;  Laterality: N/A;  note the EGD is possible  . CORONARY ANGIOPLASTY WITH STENT PLACEMENT  2006   X 1 stent  . EPIDURAL BLOCK INJECTION    . ESOPHAGOGASTRODUODENOSCOPY  01/26/2012   Procedure: ESOPHAGOGASTRODUODENOSCOPY (EGD);  Surgeon: Ladene Artist, MD,FACG;  Location: [facility] ENDOSCOPY;  Service: Endoscopy;  Laterality: N/A;  . HEMIARTHROPLASTY HIP Right 2012  . LAPAROSCOPIC CHOLECYSTECTOMY  2001  . LUMBAR FUSION  03/2013  . LUMBAR LAMINECTOMY/DECOMPRESSION MICRODISCECTOMY Right 11/04/2015   Procedure: Right Lumbar Five-Sacral One Microdiskectomy;  Surgeon: Kristeen Miss, MD;  Location: [facility] NEURO ORS;  Service: Neurosurgery;  Laterality: Right;  Right L5-S1 Microdiskectomy  . moles removed that required stiches     one on leg and one on face  . TEMPORAL ARTERY BIOPSY / LIGATION Bilateral 2011  . TONSILLECTOMY AND ADENOIDECTOMY     at age 24  . TUBAL LIGATION  ~ 1982  . X-STOP IMPLANTATION  ~ 2010   "lower back" (10/28/2012)     OB History   None      Home Medications    Prior to Admission medications   Medication Sig Start Date End Date Taking? Authorizing Provider  apixaban (ELIQUIS) 5 MG TABS tablet Take 1 tablet (5 mg total) by mouth 2 (two) times daily. 07/06/17   Imogene Burn, PA-C  calcium carbonate (OS-CAL) 600 MG TABS Take 600 mg by mouth daily.    [provider]  cholecalciferol (VITAMIN D) 1000 UNITS tablet Take 1,000 Units by  mouth daily.     [provider]  escitalopram (LEXAPRO) 10 MG tablet TAKE 1 TABLET BY MOUTH EVERY DAY 10/20/17   Laurey Morale, MD  furosemide (LASIX) 20 MG tablet Take 40 mg by mouth 2 (two) times daily.  10/11/17   [provider]  HYDROcodone-acetaminophen (NORCO) 5-325 MG tablet Take 1 tablet by mouth every 4 (four) hours as needed. 01/15/18   Elnora Morrison, MD  losartan (COZAAR) 100 MG tablet TAKE 1 TABLET BY MOUTH EVERY DAY 02/23/17   Burchette, Alinda Sierras, MD  magnesium oxide (MAG-OX) 400 MG tablet Take 1 tablet (400 mg total) by mouth 2 (two) times daily. 05/26/17   Reyne Dumas, MD  metoprolol tartrate (LOPRESSOR) 50 MG tablet Take 1.5 tablets (75 mg total) by mouth 2 (two) times daily. Take one extra tab as needed for rapid heart rate over 100. 07/07/17   Imogene Burn, PA-C  potassium chloride SA (K-DUR,KLOR-CON) 20 MEQ tablet Take 1 tablet (20 mEq total) by mouth daily. 06/08/17   Burchette, Alinda Sierras, MD  predniSONE (DELTASONE) 10 MG tablet 4 tablets for 3 days, 3 tablets for 3 days, 2 tablets for 3 days, 1 tablet continue 05/26/17   Reyne Dumas, MD  rosuvastatin (CRESTOR) 10 MG tablet Take 1 tablet (10 mg total) by mouth daily. 07/06/17   Imogene Burn, PA-C  temazepam (RESTORIL) 15 MG capsule TAKE ONE CAPSULE BY MOUTH AT BEDTIME 12/06/17   Burchette, Alinda Sierras, MD  vitamin C (ASCORBIC ACID) 500 MG tablet Take 1,000 mg by mouth daily.     [provider]  vitamin E (VITAMIN E) 400 UNIT capsule Take 400 Units by mouth daily.    [provider]    Family History Family History  Problem Relation Age of Onset  . Brain cancer Mother 36  . Heart attack Father   . Breast cancer Daughter 104  . Heart disease Other   . Hypertension Other   . Arthritis Neg Hx   . Colon cancer Neg Hx   . Osteoporosis Neg Hx     Social History Social History   Tobacco Use  . Smoking status: Never Smoker  . Smokeless tobacco: Never Used  Substance Use Topics  .  Alcohol use: Yes    Comment: socially  . Drug use: No     Allergies   Dextromethorphan   Review of Systems Review of Systems  Constitutional: Negative for fever.  Gastrointestinal: Negative for abdominal pain and vomiting.  Musculoskeletal: Negative for back pain, neck pain and neck stiffness.  Skin: Negative for rash.  Neurological: Negative for light-headedness and headaches.     Physical Exam Updated Vital Signs BP 137/81 (BP Location: Left Arm)   Pulse 73   Temp 98 F (36.7 C) (Oral)   Resp 18   Ht 5\' 4"  (1.626 m)   Wt 72.1 kg   SpO2 97%   BMI 27.29 kg/m   Physical Exam  Constitutional: She appears well-developed and well-nourished.  HENT:  Head: Normocephalic and atraumatic.  Eyes: Right eye exhibits no discharge. Left eye exhibits no discharge.  Neck: Neck supple. No tracheal deviation present.  Cardiovascular: Normal rate.  Pulmonary/Chest: Effort normal.  Musculoskeletal: She exhibits no edema.  Patient has a mild tenderness to palpation left buttocks.  Full range of motion left hip and knee without significant tenderness.  No shortening in the leg.  No swelling to the left leg.  No tenderness to palpation of lateral hip.  Neurological: She is alert.  Skin: Skin is warm. No rash noted.  Psychiatric: She has a normal mood and affect.  Nursing note and vitals reviewed.    ED Treatments / Results  Labs (all labs ordered are listed, but only abnormal results are displayed) Labs Reviewed - No data to display  EKG None  Radiology Dg Hip Unilat With Pelvis 2-3 Views Left  Result Date: 01/15/2018 CLINICAL DATA:  Left hip pain after bending over 9 days ago. EXAM: DG HIP (WITH OR WITHOUT PELVIS) 2-3V LEFT COMPARISON:  06/27/2016 FINDINGS: Prior right hip replacement, unchanged. Mild degenerative changes in the left hip with joint space narrowing and spurring. SI joints are symmetric and unremarkable. IMPRESSION: No acute bony abnormality. Electronically  Signed   By: Rolm Baptise M.D.   On: 01/15/2018 17:15    Procedures Procedures (including critical care time)  Medications Ordered in ED Medications - No data to display  Initial Impression / Assessment and Plan / ED Course  I have reviewed the triage vital signs and the nursing notes.  Pertinent labs & imaging results that were available during my care of the patient were reviewed by me and considered in my medical decision making (see chart for details).    Patient presents with left buttock/hip pain with no direct trauma.  Discussed likely muscular in origin, possibility of mild sciatica.  Discussed supportive care and follow-up with primary doctor.  X-ray reviewed no acute fracture.  Final Clinical Impressions(s) / ED Diagnoses   Final diagnoses:  Left hip pain    ED Discharge Orders         Ordered    HYDROcodone-acetaminophen (NORCO) 5-325 MG tablet  Every 4 hours PRN     01/15/18 1757           Elnora Morrison, MD 01/15/18 1800

## 2018-01-15 NOTE — ED Notes (Signed)
Patient in Xray

## 2018-01-17 ENCOUNTER — Other Ambulatory Visit: Payer: Self-pay

## 2018-01-17 ENCOUNTER — Encounter: Payer: Self-pay | Admitting: Family Medicine

## 2018-01-17 ENCOUNTER — Ambulatory Visit (INDEPENDENT_AMBULATORY_CARE_PROVIDER_SITE_OTHER): Payer: Medicare Other | Admitting: Family Medicine

## 2018-01-17 VITALS — BP 122/74 | HR 64 | Temp 97.8°F | Ht 64.0 in | Wt 165.9 lb

## 2018-01-17 DIAGNOSIS — Z9181 History of falling: Secondary | ICD-10-CM | POA: Diagnosis not present

## 2018-01-17 DIAGNOSIS — F5104 Psychophysiologic insomnia: Secondary | ICD-10-CM

## 2018-01-17 DIAGNOSIS — M545 Low back pain: Secondary | ICD-10-CM | POA: Diagnosis not present

## 2018-01-17 DIAGNOSIS — I2510 Atherosclerotic heart disease of native coronary artery without angina pectoris: Secondary | ICD-10-CM

## 2018-01-17 DIAGNOSIS — G8929 Other chronic pain: Secondary | ICD-10-CM | POA: Diagnosis not present

## 2018-01-17 MED ORDER — TRAZODONE HCL 50 MG PO TABS: 25.0000 mg | ORAL_TABLET | Freq: Every evening | ORAL | 3 refills | Status: DC | PRN

## 2018-01-17 MED ORDER — HYDROCODONE-ACETAMINOPHEN 5-325 MG PO TABS: 1.0000 | ORAL_TABLET | Freq: Four times a day (QID) | ORAL | 0 refills | Status: DC | PRN

## 2018-01-17 NOTE — Patient Instructions (Signed)
Insomnia Insomnia is a sleep disorder that makes it difficult to fall asleep or to stay asleep. Insomnia can cause tiredness (fatigue), low energy, difficulty concentrating, mood swings, and poor performance at work or school. There are three different ways to classify insomnia:  Difficulty falling asleep.  Difficulty staying asleep.  Waking up too early in the morning.  Any type of insomnia can be long-term (chronic) or short-term (acute). Both are common. Short-term insomnia usually lasts for three months or less. Chronic insomnia occurs at least three times a week for longer than three months. What are the causes? Insomnia may be caused by another condition, situation, or substance, such as:  Anxiety.  Certain medicines.  Gastroesophageal reflux disease (GERD) or other gastrointestinal conditions.  Asthma or other breathing conditions.  Restless legs syndrome, sleep apnea, or other sleep disorders.  Chronic pain.  Menopause. This may include hot flashes.  Stroke.  Abuse of alcohol, tobacco, or illegal drugs.  Depression.  Caffeine.  Neurological disorders, such as Alzheimer disease.  An overactive thyroid (hyperthyroidism).  The cause of insomnia may not be known. What increases the risk? Risk factors for insomnia include:  Gender. Women are more commonly affected than men.  Age. Insomnia is more common as you get older.  Stress. This may involve your professional or personal life.  Income. Insomnia is more common in people with lower income.  Lack of exercise.  Irregular work schedule or night shifts.  Traveling between different time zones.  What are the signs or symptoms? If you have insomnia, trouble falling asleep or trouble staying asleep is the main symptom. This may lead to other symptoms, such as:  Feeling fatigued.  Feeling nervous about going to sleep.  Not feeling rested in the morning.  Having trouble concentrating.  Feeling  irritable, anxious, or depressed.  How is this treated? Treatment for insomnia depends on the cause. If your insomnia is caused by an underlying condition, treatment will focus on addressing the condition. Treatment may also include:  Medicines to help you sleep.  Counseling or therapy.  Lifestyle adjustments.  Follow these instructions at home:  Take medicines only as directed by your health care provider.  Keep regular sleeping and waking hours. Avoid naps.  Keep a sleep diary to help you and your health care provider figure out what could be causing your insomnia. Include: ? When you sleep. ? When you wake up during the night. ? How well you sleep. ? How rested you feel the next day. ? Any side effects of medicines you are taking. ? What you eat and drink.  Make your bedroom a comfortable place where it is easy to fall asleep: ? Put up shades or special blackout curtains to block light from outside. ? Use a white noise machine to block noise. ? Keep the temperature cool.  Exercise regularly as directed by your health care provider. Avoid exercising right before bedtime.  Use relaxation techniques to manage stress. Ask your health care provider to suggest some techniques that may work well for you. These may include: ? Breathing exercises. ? Routines to release muscle tension. ? Visualizing peaceful scenes.  Cut back on alcohol, caffeinated beverages, and cigarettes, especially close to bedtime. These can disrupt your sleep.  Do not overeat or eat spicy foods right before bedtime. This can lead to digestive discomfort that can make it hard for you to sleep.  Limit screen use before bedtime. This includes: ? Watching TV. ? Using your smartphone, tablet, and   computer.  Stick to a routine. This can help you fall asleep faster. Try to do a quiet activity, brush your teeth, and go to bed at the same time each night.  Get out of bed if you are still awake after 15 minutes  of trying to sleep. Keep the lights down, but try reading or doing a quiet activity. When you feel sleepy, go back to bed.  Make sure that you drive carefully. Avoid driving if you feel very sleepy.  Keep all follow-up appointments as directed by your health care provider. This is important. Contact a health care provider if:  You are tired throughout the day or have trouble in your daily routine due to sleepiness.  You continue to have sleep problems or your sleep problems get worse. Get help right away if:  You have serious thoughts about hurting yourself or someone else. This information is not intended to replace advice given to you by your health care provider. Make sure you discuss any questions you have with your health care provider. Document Released: 03/13/2000 Document Revised: 08/16/2015 Document Reviewed: 12/15/2013 Elsevier Interactive Patient Education  2018 Elsevier Inc.  

## 2018-01-17 NOTE — Progress Notes (Signed)
Subjective:     Patient ID: Betty Jackson, female   DOB: [birth year], 83 y.o.   MRN: [id number]  HPI Patient is here to discuss the following issues  Chronic insomnia.  Patient is being treated for temporal arteritis by rheumatologist in [place].  She is currently on low-dose prednisone but states she has had difficulty sleeping ever since she went on prednisone.  She has difficulty falling asleep and staying asleep.  Sometimes gets almost no sleep.  She drinks almost no caffeine.  No alcohol.  No daytime naps.  She has taken Restoril in the past but we have discussed trying to avoid benzodiazepines because of her risk of falls and age and also the fact that she is on a blood thinner.  She denies any active depression issues.  Second issue is she has had some severe back pain.  She has had back surgeries in the past and is trying to get in to see her back specialist.  Her current pain is mostly left lower lumbar and sacroiliac region.  She had recent hip films and pelvic films which showed no acute abnormality.  She was prescribed limited hydrocodone which has helped with her severe pain.  She has to avoid nonsteroidals.  Tylenol has not controlled her pain very well.  Denies any radiculitis pain.  Past Medical History:  Diagnosis Date  . Abdominal pain, epigastric 06/22/2013  . Abdominal pain, other specified site 12/24/2011  . Abnormal urinalysis 05/20/2017  . Acute respiratory failure with hypoxia ([place]) 05/20/2017  . Anemia, unspecified 10/28/2012  . Anxiety state, unspecified 10/28/2012  . Bruises easily    d/t being on prednisone and plavix  . CAD (coronary artery disease)    a. Stent RCA in [facility];  b. Cath approx 2009 - nonobs per pt report.  . Cataract    immature on the left eye  . Chronic insomnia 02/07/2013  . Chronic lower back pain    scoliosis  . CKD (chronic kidney disease) stage 3, GFR 30-59 ml/min (HCC)   . Complication of anesthesia    pt has a very high tolerance to  meds  . Coronary disease 05/20/2017  . CVA (cerebral infarction) 10/29/2012  . DDD (degenerative disc disease)   . Depression   . Diverticulitis    hx of  . Diverticulosis   . Elevated transaminase level 02/07/2013  . Enteritis   . GERD (gastroesophageal reflux disease) 09/01/2010  . Giant cell arteritis ([place])   . Hemorrhoids   . Herniated nucleus pulposus, L5-S1, right 11/04/2015  . History of scabies   . HTN (hypertension) 05/20/2017  . Hyperlipidemia    takes Lipitor daily  . Hypertension    takes Amlodipine,Losartan,Metoprolol,and HCTZ daily  . Incontinence of urine   . Influenza A 05/20/2017  . Insomnia    takes Restoril nightly  . Lumbar stenosis 04/25/2013  . Major depressive disorder, recurrent episode, moderate ([place]) 07/16/2013  . Migraines    "back in my 20's; none since" (10/28/2012)  . Osteoporosis   . PAF (paroxysmal atrial fibrillation) ([place]) 2011   a. lone epidode in 2011 according to notes.  . Rheumatoid arthritis ([place])   . Scoliosis   . Sepsis ([place]) 05/20/2017  . Sinus bradycardia    a. on chronic bb  . Temporal arteritis ([place]) 2011   a. followed @ [facility]; potential flareup 10/28/2012/notes 10/28/2012  . Vocal cord dysfunction    "they don't operate properly" (10/28/2012)   Past Surgical History:  Procedure Laterality  Date  . ABDOMINAL HYSTERECTOMY  ~ 1984   vaginally  . BACK SURGERY  7-86yrs ago   X Stop  . BLADDER SUSPENSION  2001  . BREAST BIOPSY Right   . CATARACT EXTRACTION W/ INTRAOCULAR LENS IMPLANT Right ~ 08/2012  . COLONOSCOPY  01/26/2012   Procedure: COLONOSCOPY;  Surgeon: Ladene Artist, MD,FACG;  Location[facility] ENDOSCOPY;  Service: Endoscopy;  Laterality: N/A;  note the EGD is possible  . CORONARY ANGIOPLASTY WITH STENT PLACEMENT  2006   X 1 stent  . EPIDURAL BLOCK INJECTION    . ESOPHAGOGASTRODUODENOSCOPY  01/26/2012   Procedure: ESOPHAGOGASTRODUODENOSCOPY (EGD);  Surgeon: Ladene Artist, MD,FACG;  Location: [facility] ENDOSCOPY;  Service: Endoscopy;   Laterality: N/A;  . HEMIARTHROPLASTY HIP Right 2012  . LAPAROSCOPIC CHOLECYSTECTOMY  2001  . LUMBAR FUSION  03/2013  . LUMBAR LAMINECTOMY/DECOMPRESSION MICRODISCECTOMY Right 11/04/2015   Procedure: Right Lumbar Five-Sacral One Microdiskectomy;  Surgeon: Kristeen Miss, MD;  Location: [facility] NEURO ORS;  Service: Neurosurgery;  Laterality: Right;  Right L5-S1 Microdiskectomy  . moles removed that required stiches     one on leg and one on face  . TEMPORAL ARTERY BIOPSY / LIGATION Bilateral 2011  . TONSILLECTOMY AND ADENOIDECTOMY     at age 73  . TUBAL LIGATION  ~ 1982  . X-STOP IMPLANTATION  ~ 2010   "lower back" (10/28/2012)    reports that she has never smoked. She has never used smokeless tobacco. She reports that she drinks alcohol. She reports that she does not use drugs. family history includes Brain cancer (age of onset: 75) in her mother; Breast cancer (age of onset: 16) in her daughter; Heart attack in her father; Heart disease in her other; Hypertension in her other. Allergies  Allergen Reactions  . Dextromethorphan Rash     Review of Systems  Constitutional: Negative for chills and fever.  Respiratory: Negative for shortness of breath.   Cardiovascular: Negative for chest pain.  Gastrointestinal: Negative for abdominal pain.  Genitourinary: Negative for dysuria.  Musculoskeletal: Positive for back pain.  Psychiatric/Behavioral: Positive for sleep disturbance. Negative for agitation, dysphoric mood and suicidal ideas.       Objective:   Physical Exam  Constitutional: She is oriented to person, place, and time. She appears well-developed and well-nourished.  Cardiovascular: Normal rate and regular rhythm.  Pulmonary/Chest: Effort normal and breath sounds normal.  Musculoskeletal: She exhibits no edema.  Neurological: She is alert and oriented to person, place, and time.       Assessment:     #1 chronic insomnia probably exacerbated by chronic prednisone use  #2 high risk  of recurrent falls  #3 acute on chronic low back pain    Plan:     -Sleep hygiene discussed with handout given -Recommend trial of trazodone 50 mg nightly -Avoid benzodiazepines as much as possible -We wrote for limited hydrocodone 5/325 mg 1 every 6 hours as needed for severe pain 20 with no refill until she can see her neurosurgeon -Recommend follow-up in 1 month to reassess  Eulas Post MD [facility]

## 2018-01-26 ENCOUNTER — Other Ambulatory Visit: Payer: Self-pay | Admitting: Family Medicine

## 2018-02-08 ENCOUNTER — Other Ambulatory Visit: Payer: Self-pay | Admitting: Family Medicine

## 2018-02-16 ENCOUNTER — Other Ambulatory Visit: Payer: Self-pay

## 2018-02-18 ENCOUNTER — Other Ambulatory Visit: Payer: Self-pay | Admitting: Family Medicine

## 2018-02-18 ENCOUNTER — Encounter: Payer: Self-pay | Admitting: Family Medicine

## 2018-02-18 ENCOUNTER — Ambulatory Visit (INDEPENDENT_AMBULATORY_CARE_PROVIDER_SITE_OTHER): Payer: Medicare Other | Admitting: Family Medicine

## 2018-02-18 VITALS — BP 110/62 | HR 62 | Temp 97.7°F | Ht 64.0 in | Wt 165.6 lb

## 2018-02-18 DIAGNOSIS — I480 Paroxysmal atrial fibrillation: Secondary | ICD-10-CM | POA: Diagnosis not present

## 2018-02-18 DIAGNOSIS — I2510 Atherosclerotic heart disease of native coronary artery without angina pectoris: Secondary | ICD-10-CM

## 2018-02-18 DIAGNOSIS — F5104 Psychophysiologic insomnia: Secondary | ICD-10-CM | POA: Diagnosis not present

## 2018-02-18 DIAGNOSIS — I10 Essential (primary) hypertension: Secondary | ICD-10-CM | POA: Diagnosis not present

## 2018-02-18 MED ORDER — TEMAZEPAM 15 MG PO CAPS: 15.0000 mg | ORAL_CAPSULE | Freq: Every evening | ORAL | 5 refills | Status: DC | PRN

## 2018-02-18 MED ORDER — ESCITALOPRAM OXALATE 10 MG PO TABS: 10.0000 mg | ORAL_TABLET | Freq: Every day | ORAL | 3 refills | Status: DC

## 2018-02-18 NOTE — Patient Instructions (Signed)
Insomnia Insomnia is a sleep disorder that makes it difficult to fall asleep or to stay asleep. Insomnia can cause tiredness (fatigue), low energy, difficulty concentrating, mood swings, and poor performance at work or school. There are three different ways to classify insomnia:  Difficulty falling asleep.  Difficulty staying asleep.  Waking up too early in the morning.  Any type of insomnia can be long-term (chronic) or short-term (acute). Both are common. Short-term insomnia usually lasts for three months or less. Chronic insomnia occurs at least three times a week for longer than three months. What are the causes? Insomnia may be caused by another condition, situation, or substance, such as:  Anxiety.  Certain medicines.  Gastroesophageal reflux disease (GERD) or other gastrointestinal conditions.  Asthma or other breathing conditions.  Restless legs syndrome, sleep apnea, or other sleep disorders.  Chronic pain.  Menopause. This may include hot flashes.  Stroke.  Abuse of alcohol, tobacco, or illegal drugs.  Depression.  Caffeine.  Neurological disorders, such as Alzheimer disease.  An overactive thyroid (hyperthyroidism).  The cause of insomnia may not be known. What increases the risk? Risk factors for insomnia include:  Gender. Women are more commonly affected than men.  Age. Insomnia is more common as you get older.  Stress. This may involve your professional or personal life.  Income. Insomnia is more common in people with lower income.  Lack of exercise.  Irregular work schedule or night shifts.  Traveling between different time zones.  What are the signs or symptoms? If you have insomnia, trouble falling asleep or trouble staying asleep is the main symptom. This may lead to other symptoms, such as:  Feeling fatigued.  Feeling nervous about going to sleep.  Not feeling rested in the morning.  Having trouble concentrating.  Feeling  irritable, anxious, or depressed.  How is this treated? Treatment for insomnia depends on the cause. If your insomnia is caused by an underlying condition, treatment will focus on addressing the condition. Treatment may also include:  Medicines to help you sleep.  Counseling or therapy.  Lifestyle adjustments.  Follow these instructions at home:  Take medicines only as directed by your health care provider.  Keep regular sleeping and waking hours. Avoid naps.  Keep a sleep diary to help you and your health care provider figure out what could be causing your insomnia. Include: ? When you sleep. ? When you wake up during the night. ? How well you sleep. ? How rested you feel the next day. ? Any side effects of medicines you are taking. ? What you eat and drink.  Make your bedroom a comfortable place where it is easy to fall asleep: ? Put up shades or special blackout curtains to block light from outside. ? Use a white noise machine to block noise. ? Keep the temperature cool.  Exercise regularly as directed by your health care provider. Avoid exercising right before bedtime.  Use relaxation techniques to manage stress. Ask your health care provider to suggest some techniques that may work well for you. These may include: ? Breathing exercises. ? Routines to release muscle tension. ? Visualizing peaceful scenes.  Cut back on alcohol, caffeinated beverages, and cigarettes, especially close to bedtime. These can disrupt your sleep.  Do not overeat or eat spicy foods right before bedtime. This can lead to digestive discomfort that can make it hard for you to sleep.  Limit screen use before bedtime. This includes: ? Watching TV. ? Using your smartphone, tablet, and   computer.  Stick to a routine. This can help you fall asleep faster. Try to do a quiet activity, brush your teeth, and go to bed at the same time each night.  Get out of bed if you are still awake after 15 minutes  of trying to sleep. Keep the lights down, but try reading or doing a quiet activity. When you feel sleepy, go back to bed.  Make sure that you drive carefully. Avoid driving if you feel very sleepy.  Keep all follow-up appointments as directed by your health care provider. This is important. Contact a health care provider if:  You are tired throughout the day or have trouble in your daily routine due to sleepiness.  You continue to have sleep problems or your sleep problems get worse. Get help right away if:  You have serious thoughts about hurting yourself or someone else. This information is not intended to replace advice given to you by your health care provider. Make sure you discuss any questions you have with your health care provider. Document Released: 03/13/2000 Document Revised: 08/16/2015 Document Reviewed: 12/15/2013 Elsevier Interactive Patient Education  2018 Elsevier Inc.  

## 2018-02-18 NOTE — Progress Notes (Signed)
Subjective:     Patient ID: Betty Jackson, female   DOB: [birth year], 83 y.o.   MRN: [id number]  HPI Patient here to discuss the following items  She has questions regarding magnesium.  She currently takes magnesium oxide 400 mg daily.  She is not sure how long she has been on this.  She had magnesium level back in September which was upper limit of normal 2.3.  Chronic insomnia.  We have tried to keep her off benzodiazepines.  We tried trazodone recently but she had severe headache and stopped this after 2 nights.  She has had some improvement with Tylenol PM but inconsistently.  She has taken Restoril 15 mg at night in the past and that seems to work the best.  We talked about fall risk with benzos but she states she gets very little sleep without it.  No alcohol use at night.  No caffeine use at night.  Chronic problems include history of temporal arteritis, hypertension, CAD, atrial fibrillation.  She remains on Eliquis.  Medications reviewed.  Compliant with all.  No recent dizziness or falls.  Past Medical History:  Diagnosis Date  . Abdominal pain, epigastric 06/22/2013  . Abdominal pain, other specified site 12/24/2011  . Abnormal urinalysis 05/20/2017  . Acute respiratory failure with hypoxia ([place]) 05/20/2017  . Anemia, unspecified 10/28/2012  . Anxiety state, unspecified 10/28/2012  . Bruises easily    d/t being on prednisone and plavix  . CAD (coronary artery disease)    a. Stent RCA in [facility];  b. Cath approx 2009 - nonobs per pt report.  . Cataract    immature on the left eye  . Chronic insomnia 02/07/2013  . Chronic lower back pain    scoliosis  . CKD (chronic kidney disease) stage 3, GFR 30-59 ml/min (HCC)   . Complication of anesthesia    pt has a very high tolerance to meds  . Coronary disease 05/20/2017  . CVA (cerebral infarction) 10/29/2012  . DDD (degenerative disc disease)   . Depression   . Diverticulitis    hx of  . Diverticulosis   . Elevated  transaminase level 02/07/2013  . Enteritis   . GERD (gastroesophageal reflux disease) 09/01/2010  . Giant cell arteritis ([place])   . Hemorrhoids   . Herniated nucleus pulposus, L5-S1, right 11/04/2015  . History of scabies   . HTN (hypertension) 05/20/2017  . Hyperlipidemia    takes Lipitor daily  . Hypertension    takes Amlodipine,Losartan,Metoprolol,and HCTZ daily  . Incontinence of urine   . Influenza A 05/20/2017  . Insomnia    takes Restoril nightly  . Lumbar stenosis 04/25/2013  . Major depressive disorder, recurrent episode, moderate ([place]) 07/16/2013  . Migraines    "back in my 20's; none since" (10/28/2012)  . Osteoporosis   . PAF (paroxysmal atrial fibrillation) ([place]) 2011   a. lone epidode in 2011 according to notes.  . Rheumatoid arthritis ([place])   . Scoliosis   . Sepsis ([place]) 05/20/2017  . Sinus bradycardia    a. on chronic bb  . Temporal arteritis ([place]) 2011   a. followed @ [facility]; potential flareup 10/28/2012/notes 10/28/2012  . Vocal cord dysfunction    "they don't operate properly" (10/28/2012)   Past Surgical History:  Procedure Laterality Date  . ABDOMINAL HYSTERECTOMY  ~ 1984   vaginally  . BACK SURGERY  7-55yrs ago   X Stop  . BLADDER SUSPENSION  2001  . BREAST BIOPSY Right   .  CATARACT EXTRACTION W/ INTRAOCULAR LENS IMPLANT Right ~ 08/2012  . COLONOSCOPY  01/26/2012   Procedure: COLONOSCOPY;  Surgeon: Ladene Artist, MD,FACG;  Location: [facility] ENDOSCOPY;  Service: Endoscopy;  Laterality: N/A;  note the EGD is possible  . CORONARY ANGIOPLASTY WITH STENT PLACEMENT  2006   X 1 stent  . EPIDURAL BLOCK INJECTION    . ESOPHAGOGASTRODUODENOSCOPY  01/26/2012   Procedure: ESOPHAGOGASTRODUODENOSCOPY (EGD);  Surgeon: Ladene Artist, MD,FACG;  Location: [facility] ENDOSCOPY;  Service: Endoscopy;  Laterality: N/A;  . HEMIARTHROPLASTY HIP Right 2012  . LAPAROSCOPIC CHOLECYSTECTOMY  2001  . LUMBAR FUSION  03/2013  . LUMBAR LAMINECTOMY/DECOMPRESSION MICRODISCECTOMY Right 11/04/2015   Procedure:  Right Lumbar Five-Sacral One Microdiskectomy;  Surgeon: Kristeen Miss, MD;  Location: [place] NEURO ORS;  Service: Neurosurgery;  Laterality: Right;  Right L5-S1 Microdiskectomy  . moles removed that required stiches     one on leg and one on face  . TEMPORAL ARTERY BIOPSY / LIGATION Bilateral 2011  . TONSILLECTOMY AND ADENOIDECTOMY     at age 32  . TUBAL LIGATION  ~ 1982  . X-STOP IMPLANTATION  ~ 2010   "lower back" (10/28/2012)    reports that she has never smoked. She has never used smokeless tobacco. She reports that she drinks alcohol. She reports that she does not use drugs. family history includes Brain cancer (age of onset: 45) in her mother; Breast cancer (age of onset: 3) in her daughter; Heart attack in her father; Heart disease in her other; Hypertension in her other. Allergies  Allergen Reactions  . Dextromethorphan Rash       Review of Systems  Constitutional: Negative for fatigue.  Eyes: Negative for visual disturbance.  Respiratory: Negative for cough, chest tightness, shortness of breath and wheezing.   Cardiovascular: Negative for chest pain, palpitations and leg swelling.  Neurological: Negative for dizziness, seizures, syncope, weakness, light-headedness and headaches.       Objective:   Physical Exam  Constitutional: She is oriented to person, place, and time. She appears well-developed and well-nourished.  Cardiovascular: Normal rate.  Irregular rhythm but rate controlled  Pulmonary/Chest: Effort normal and breath sounds normal.  Neurological: She is alert and oriented to person, place, and time.       Assessment:     #1 history of chronic insomnia.  Recent intolerance with trazodone  #2 atrial fibrillation stable on Eliquis and rate controlled  #3 hypertension stable and at goal    Plan:     -Long discussion regarding insomnia.  She is aware of potential fall risk with benzos.  She has not gotten relief with any other therapy such as melatonin and  inconsistent relief with Tylenol PM.  We agreed to Restoril 15 mg nightly  -Continue other current medications.  Eulas Post MD [facility] at [facility]

## 2018-02-23 DIAGNOSIS — M47816 Spondylosis without myelopathy or radiculopathy, lumbar region: Secondary | ICD-10-CM | POA: Diagnosis not present

## 2018-02-28 ENCOUNTER — Other Ambulatory Visit: Payer: Self-pay | Admitting: Family Medicine

## 2018-03-01 NOTE — Telephone Encounter (Signed)
If discontinued per cardiology would not refill.

## 2018-03-01 NOTE — Telephone Encounter (Signed)
Please see message on Rx request. Medication not currently on med list and looks like this was discontinued in October 2019 from cardiology.   Does the patient need to continue this medication? Please advise.

## 2018-03-08 DIAGNOSIS — Z8739 Personal history of other diseases of the musculoskeletal system and connective tissue: Secondary | ICD-10-CM | POA: Diagnosis not present

## 2018-03-08 DIAGNOSIS — Z981 Arthrodesis status: Secondary | ICD-10-CM | POA: Diagnosis not present

## 2018-03-08 DIAGNOSIS — M5136 Other intervertebral disc degeneration, lumbar region: Secondary | ICD-10-CM | POA: Diagnosis not present

## 2018-03-18 ENCOUNTER — Ambulatory Visit: Payer: Medicare Other | Admitting: Cardiovascular Disease

## 2018-03-25 ENCOUNTER — Other Ambulatory Visit: Payer: Self-pay | Admitting: Family Medicine

## 2018-03-25 NOTE — Telephone Encounter (Signed)
Last OV 02/18/18, No future OV  Last filled 01/17/18, # 20 with 0 refills  Please see messages

## 2018-03-25 NOTE — Telephone Encounter (Signed)
Requested medication (s) are due for refill today: yes  Requested medication (s) are on the active medication list: yes  Last refill:  01/17/18  Future visit scheduled: no  Notes to clinic:  Controlled substance   Requested Prescriptions  Pending Prescriptions Disp Refills   HYDROcodone-acetaminophen (NORCO) 5-325 MG tablet 20 tablet 0    Sig: Take 1 tablet by mouth every 6 (six) hours as needed.     Not Delegated - Analgesics:  Opioid Agonist Combinations Failed - 03/25/2018 11:08 AM      Failed - This refill cannot be delegated      Failed - Urine Drug Screen completed in last 360 days.      Passed - Valid encounter within last 6 months    Recent Outpatient Visits          1 month ago Essential hypertension   [facility]rapist, [facility] at [facility], Alinda Sierras, MD   2 months ago Chronic insomnia   Therap[facility] at [facility], Alinda Sierras, MD   5 months ago Bilateral leg edema   Therap[facility] at [facility], Alinda Sierras, MD   7 months ago Bilateral lower extremity edema   Therapist, music at [facility], Langley Adie, MD   9 months ago [place] at [facility], Alinda Sierras, MD

## 2018-03-25 NOTE — Telephone Encounter (Signed)
Copied from [place] [id number]. Topic: Quick Communication - Rx Refill/Question >> Mar 25, 2018 10:51 AM Sheppard Coil, [employer] wrote: Medication: HYDROcodone-acetaminophen (NORCO) 5-325 MG tablet  Pt states that she needs a few.  States she got two shots in her back, and it didn't help.  Pt states she is in tremendous pain.  Pt states that Dr. Ellene Route is going to send her to pain management, but she can't get in with them this week.  Pt wants something to help her get through weekend.  Has the patient contacted their pharmacy? no (Agent: If no, request that the patient contact the pharmacy for the refill.) (Agent: If yes, when and what did the pharmacy advise?)  Preferred Pharmacy (with phone number or street name): [id number] [address], [place] [phone] (Phone) [phone] (Fax)  Agent: Please be advised that RX refills may take up to 3 business days. We ask that you follow-up with your pharmacy.

## 2018-03-27 NOTE — Telephone Encounter (Signed)
She does not take this regularly.  Recommend try plain Tylenol first.  If she is requiring daily pain medication for her back pain we could offer pain management follow up.

## 2018-04-06 ENCOUNTER — Other Ambulatory Visit: Payer: Self-pay

## 2018-04-06 ENCOUNTER — Encounter: Payer: Self-pay | Admitting: Family Medicine

## 2018-04-06 ENCOUNTER — Ambulatory Visit (INDEPENDENT_AMBULATORY_CARE_PROVIDER_SITE_OTHER): Payer: Medicare Other | Admitting: Family Medicine

## 2018-04-06 VITALS — BP 138/86 | HR 66 | Temp 98.3°F | Ht 64.0 in | Wt 166.5 lb

## 2018-04-06 DIAGNOSIS — M545 Low back pain, unspecified: Secondary | ICD-10-CM

## 2018-04-06 DIAGNOSIS — I10 Essential (primary) hypertension: Secondary | ICD-10-CM

## 2018-04-06 DIAGNOSIS — I480 Paroxysmal atrial fibrillation: Secondary | ICD-10-CM | POA: Diagnosis not present

## 2018-04-06 DIAGNOSIS — M316 Other giant cell arteritis: Secondary | ICD-10-CM

## 2018-04-06 DIAGNOSIS — Z79899 Other long term (current) drug therapy: Secondary | ICD-10-CM | POA: Diagnosis not present

## 2018-04-06 DIAGNOSIS — G8929 Other chronic pain: Secondary | ICD-10-CM

## 2018-04-06 NOTE — Patient Instructions (Signed)
We will set up PT referral for some home PT

## 2018-04-06 NOTE — Progress Notes (Signed)
Subjective:     Patient ID: Betty Jackson, female   DOB: 03-21-1935, 84 y.o.   MRN: [id number]  HPI Patient is seen with complaints of some chronic back pain.  She has had previous back surgery and has been followed closely by the neurosurgical group.  She sees Dr. Ellene Route.  She had 2 recent injections in her low back without any improvement.  Current pain is mostly right lower lumbar with radiation occasionally toward the buttock.  No radiation into the leg.  She is here to discuss possible pain management issues.  She has tried topical lidocaine in the past without improvement.  She is on Eliquis.  Cannot take nonsteroidals.  She has taken hydrocodone occasionally in the past with minimal relief.  Pain severe at times.  Interfering with sleep some and day to day activities.  Ambulates with walker.  She has had physical therapy in the past with some benefit and is requesting consideration for trial of home physical therapy.  Hypertension hx and hx of atrial fib.  Medications reviewed. Compliant with all.    Past Medical History:  Diagnosis Date  . Abdominal pain, epigastric 06/22/2013  . Abdominal pain, other specified site 12/24/2011  . Abnormal urinalysis 05/20/2017  . Acute respiratory failure with hypoxia ([place]) 05/20/2017  . Anemia, unspecified 10/28/2012  . Anxiety state, unspecified 10/28/2012  . Bruises easily    d/t being on prednisone and plavix  . CAD (coronary artery disease)    a. Stent RCA in [facility];  b. Cath approx 2009 - nonobs per pt report.  . Cataract    immature on the left eye  . Chronic insomnia 02/07/2013  . Chronic lower back pain    scoliosis  . CKD (chronic kidney disease) stage 3, GFR 30-59 ml/min (HCC)   . Complication of anesthesia    pt has a very high tolerance to meds  . Coronary disease 05/20/2017  . CVA (cerebral infarction) 10/29/2012  . DDD (degenerative disc disease)   . Depression   . Diverticulitis    hx of  . Diverticulosis   . Elevated  transaminase level 02/07/2013  . Enteritis   . GERD (gastroesophageal reflux disease) 09/01/2010  . Giant cell arteritis ([place])   . Hemorrhoids   . Herniated nucleus pulposus, L5-S1, right 11/04/2015  . History of scabies   . HTN (hypertension) 05/20/2017  . Hyperlipidemia    takes Lipitor daily  . Hypertension    takes Amlodipine,Losartan,Metoprolol,and HCTZ daily  . Incontinence of urine   . Influenza A 05/20/2017  . Insomnia    takes Restoril nightly  . Lumbar stenosis 04/25/2013  . Major depressive disorder, recurrent episode, moderate ([place]) 07/16/2013  . Migraines    "back in my 20's; none since" (10/28/2012)  . Osteoporosis   . PAF (paroxysmal atrial fibrillation) ([place]) 2011   a. lone epidode in 2011 according to notes.  . Rheumatoid arthritis ([place])   . Scoliosis   . Sepsis ([place]) 05/20/2017  . Sinus bradycardia    a. on chronic bb  . Temporal arteritis ([place]) 2011   a. followed @ [facility]; potential flareup 10/28/2012/notes 10/28/2012  . Vocal cord dysfunction    "they don't operate properly" (10/28/2012)   Past Surgical History:  Procedure Laterality Date  . ABDOMINAL HYSTERECTOMY  ~ 1984   vaginally  . BACK SURGERY  7-43yrs ago   X Stop  . BLADDER SUSPENSION  2001  . BREAST BIOPSY Right   . CATARACT EXTRACTION W/ INTRAOCULAR  LENS IMPLANT Right ~ 08/2012  . COLONOSCOPY  01/26/2012   Procedure: COLONOSCOPY;  Surgeon: Ladene Artist, MD,FACG;  Location: [facility] ENDOSCOPY;  Service: Endoscopy;  Laterality: N/A;  note the EGD is possible  . CORONARY ANGIOPLASTY WITH STENT PLACEMENT  2006   X 1 stent  . EPIDURAL BLOCK INJECTION    . ESOPHAGOGASTRODUODENOSCOPY  01/26/2012   Procedure: ESOPHAGOGASTRODUODENOSCOPY (EGD);  Surgeon: Ladene Artist, MD,FACG;  Location: [facility] ENDOSCOPY;  Service: Endoscopy;  Laterality: N/A;  . HEMIARTHROPLASTY HIP Right 2012  . LAPAROSCOPIC CHOLECYSTECTOMY  2001  . LUMBAR FUSION  03/2013  . LUMBAR LAMINECTOMY/DECOMPRESSION MICRODISCECTOMY Right 11/04/2015   Procedure:  Right Lumbar Five-Sacral One Microdiskectomy;  Surgeon: Kristeen Miss, MD;  Location: [place] NEURO ORS;  Service: Neurosurgery;  Laterality: Right;  Right L5-S1 Microdiskectomy  . moles removed that required stiches     one on leg and one on face  . TEMPORAL ARTERY BIOPSY / LIGATION Bilateral 2011  . TONSILLECTOMY AND ADENOIDECTOMY     at age 61  . TUBAL LIGATION  ~ 1982  . X-STOP IMPLANTATION  ~ 2010   "lower back" (10/28/2012)    reports that she has never smoked. She has never used smokeless tobacco. She reports current alcohol use. She reports that she does not use drugs. family history includes Brain cancer (age of onset: 62) in her mother; Breast cancer (age of onset: 12) in her daughter; Heart attack in her father; Heart disease in an other family member; Hypertension in an other family member. Allergies  Allergen Reactions  . Dextromethorphan Rash     Review of Systems  Constitutional: Negative for appetite change and unexpected weight change.  Gastrointestinal: Negative for abdominal pain.  Genitourinary: Negative for dysuria.  Musculoskeletal: Positive for back pain.  Neurological: Negative for weakness and numbness.       Objective:   Physical Exam Constitutional:      Appearance: Normal appearance.  Cardiovascular:     Rate and Rhythm: Normal rate.  Pulmonary:     Effort: Pulmonary effort is normal.     Breath sounds: Normal breath sounds.  Musculoskeletal:     Right lower leg: No edema.     Left lower leg: No edema.     Comments: Right leg raises are negative.  No localized tenderness back region  Neurological:     Mental Status: She is alert.     Comments: Full strength with plantarflexion, dorsiflexion, and knee extension bilaterally        Assessment:     #1 Chronic low back pain.  Patient has had recent epidural injections without improvement.  #2 hypertension- stable.  #3 hx of paroxysmal atrial fibrillation.    Plan:     -Set up home physical  therapy.  Getting out and coming for apointments is a hardship and she does not any longer drive, nor does her husband -We will discuss with her neurosurgical group whether there is someone there who could help with her pain management -would try to avoid chronic opioids if possible.  Eulas Post MD [facility] at [facility]

## 2018-04-07 LAB — SEDIMENTATION RATE: SED RATE: 10 mm/h (ref 0–30)

## 2018-04-07 LAB — HIGH SENSITIVITY CRP: CRP HIGH SENSITIVITY: 0.53 mg/L (ref 0.000–5.000)

## 2018-04-07 LAB — C-REACTIVE PROTEIN: CRP: 0.1 mg/dL — ABNORMAL LOW (ref 0.5–20.0)

## 2018-04-12 ENCOUNTER — Telehealth: Payer: Self-pay

## 2018-04-12 NOTE — Telephone Encounter (Signed)
Called patient and left a detailed voice message to let her know that Dr. Elease Hashimoto recommends that she reach out to Dr. Brien Few at [facility] for Chronic Back Pain Management since she is already a patient there.  OK for [facility] to discuss/schedule this message.  CRM Created

## 2018-04-13 ENCOUNTER — Emergency Department (HOSPITAL_BASED_OUTPATIENT_CLINIC_OR_DEPARTMENT_OTHER): Payer: Medicare Other

## 2018-04-13 ENCOUNTER — Telehealth: Payer: Self-pay | Admitting: Family Medicine

## 2018-04-13 ENCOUNTER — Encounter (HOSPITAL_BASED_OUTPATIENT_CLINIC_OR_DEPARTMENT_OTHER): Payer: Self-pay | Admitting: Emergency Medicine

## 2018-04-13 ENCOUNTER — Other Ambulatory Visit: Payer: Self-pay

## 2018-04-13 ENCOUNTER — Emergency Department (HOSPITAL_BASED_OUTPATIENT_CLINIC_OR_DEPARTMENT_OTHER)
Admission: EM | Admit: 2018-04-13 | Discharge: 2018-04-13 | Disposition: A | Discharge status: Home / Self Care | Payer: Medicare Other | Attending: Emergency Medicine | Admitting: Emergency Medicine

## 2018-04-13 DIAGNOSIS — Y92008 Other place in unspecified non-institutional (private) residence as the place of occurrence of the external cause: Secondary | ICD-10-CM | POA: Insufficient documentation

## 2018-04-13 DIAGNOSIS — I259 Chronic ischemic heart disease, unspecified: Secondary | ICD-10-CM | POA: Diagnosis not present

## 2018-04-13 DIAGNOSIS — M79641 Pain in right hand: Secondary | ICD-10-CM

## 2018-04-13 DIAGNOSIS — W01198A Fall on same level from slipping, tripping and stumbling with subsequent striking against other object, initial encounter: Secondary | ICD-10-CM | POA: Diagnosis not present

## 2018-04-13 DIAGNOSIS — W19XXXA Unspecified fall, initial encounter: Secondary | ICD-10-CM

## 2018-04-13 DIAGNOSIS — Y92009 Unspecified place in unspecified non-institutional (private) residence as the place of occurrence of the external cause: Secondary | ICD-10-CM

## 2018-04-13 DIAGNOSIS — Z7901 Long term (current) use of anticoagulants: Secondary | ICD-10-CM | POA: Insufficient documentation

## 2018-04-13 DIAGNOSIS — S60221A Contusion of right hand, initial encounter: Secondary | ICD-10-CM

## 2018-04-13 DIAGNOSIS — Y999 Unspecified external cause status: Secondary | ICD-10-CM | POA: Diagnosis not present

## 2018-04-13 DIAGNOSIS — S6991XA Unspecified injury of right wrist, hand and finger(s), initial encounter: Secondary | ICD-10-CM | POA: Diagnosis not present

## 2018-04-13 DIAGNOSIS — Z79899 Other long term (current) drug therapy: Secondary | ICD-10-CM | POA: Insufficient documentation

## 2018-04-13 DIAGNOSIS — Y9389 Activity, other specified: Secondary | ICD-10-CM | POA: Insufficient documentation

## 2018-04-13 DIAGNOSIS — I129 Hypertensive chronic kidney disease with stage 1 through stage 4 chronic kidney disease, or unspecified chronic kidney disease: Secondary | ICD-10-CM | POA: Diagnosis not present

## 2018-04-13 DIAGNOSIS — N183 Chronic kidney disease, stage 3 (moderate): Secondary | ICD-10-CM | POA: Insufficient documentation

## 2018-04-13 MED ORDER — OXYCODONE-ACETAMINOPHEN 5-325 MG PO TABS
1.0000 | ORAL_TABLET | Freq: Once | ORAL | Status: AC
  Administered 2018-04-13: 1 via ORAL

## 2018-04-13 MED ORDER — OXYCODONE-ACETAMINOPHEN 5-325 MG PO TABS
ORAL_TABLET | ORAL | Status: AC
  Filled 2018-04-13: qty 1

## 2018-04-13 NOTE — ED Notes (Signed)
Pt states " that medication is not going to work , I already takes medication for my back everyday" PA made aware, no new orders

## 2018-04-13 NOTE — ED Provider Notes (Signed)
[facility] EMERGENCY DEPARTMENT Provider Note   CSN: [id number] Arrival date & time: 04/13/18  1250     History   Chief Complaint Chief Complaint  Patient presents with  . Fall  . Hand Pain    HPI Betty Jackson is a 84 y.o. female.  HPI Patient presents to the emergency department with injury to her right hand after tripping over her husband's oxygen tubing.  The patient states that she did not hit her head or lose consciousness.  Patient states she has no other injuries and she is able to ambulate without difficulty.  Patient is complaining of pain in the right hand at the base of the thumb and into the right wrist.  The patient states that she took a hydrocodone with no relief of her symptoms.  Patient denies headache, blurred vision, dizziness, weakness, numbness or syncope. Past Medical History:  Diagnosis Date  . Abdominal pain, epigastric 06/22/2013  . Abdominal pain, other specified site 12/24/2011  . Abnormal urinalysis 05/20/2017  . Acute respiratory failure with hypoxia ([place]) 05/20/2017  . Anemia, unspecified 10/28/2012  . Anxiety state, unspecified 10/28/2012  . Bruises easily    d/t being on prednisone and plavix  . CAD (coronary artery disease)    a. Stent RCA in [facility];  b. Cath approx 2009 - nonobs per pt report.  . Cataract    immature on the left eye  . Chronic insomnia 02/07/2013  . Chronic lower back pain    scoliosis  . CKD (chronic kidney disease) stage 3, GFR 30-59 ml/min (HCC)   . Complication of anesthesia    pt has a very high tolerance to meds  . Coronary disease 05/20/2017  . CVA (cerebral infarction) 10/29/2012  . DDD (degenerative disc disease)   . Depression   . Diverticulitis    hx of  . Diverticulosis   . Elevated transaminase level 02/07/2013  . Enteritis   . GERD (gastroesophageal reflux disease) 09/01/2010  . Giant cell arteritis ([place])   . Hemorrhoids   . Herniated nucleus pulposus, L5-S1, right 11/04/2015  . History of  scabies   . HTN (hypertension) 05/20/2017  . Hyperlipidemia    takes Lipitor daily  . Hypertension    takes Amlodipine,Losartan,Metoprolol,and HCTZ daily  . Incontinence of urine   . Influenza A 05/20/2017  . Insomnia    takes Restoril nightly  . Lumbar stenosis 04/25/2013  . Major depressive disorder, recurrent episode, moderate ([place]) 07/16/2013  . Migraines    "back in my 20's; none since" (10/28/2012)  . Osteoporosis   . PAF (paroxysmal atrial fibrillation) ([place]) 2011   a. lone epidode in 2011 according to notes.  . Rheumatoid arthritis ([place])   . Scoliosis   . Sepsis ([place]) 05/20/2017  . Sinus bradycardia    a. on chronic bb  . Temporal arteritis ([place]) 2011   a. followed @ [facility]; potential flareup 10/28/2012/notes 10/28/2012  . Vocal cord dysfunction    "they don't operate properly" (10/28/2012)    Patient Active Problem List   Diagnosis Date Noted  . Influenza A 05/20/2017  . Sepsis ([place]) 05/20/2017  . Acute respiratory failure with hypoxia ([place]) 05/20/2017  . CKD (chronic kidney disease) stage 3, GFR 30-59 ml/min (HCC) 05/20/2017  . PAF (paroxysmal atrial fibrillation) ([place]) 05/20/2017  . HTN (hypertension) 05/20/2017  . Giant cell arteritis ([place]) 05/20/2017  . Coronary disease 05/20/2017  . Abnormal urinalysis 05/20/2017  . Osteoporosis 05/08/2016  . Herniated nucleus pulposus, L5-S1, right  11/04/2015  . Major depressive disorder, recurrent episode, moderate ([place]) 07/16/2013  . Abdominal pain, epigastric 06/22/2013  . Lumbar stenosis 04/25/2013  . Chronic insomnia 02/07/2013  . Elevated transaminase level 02/07/2013  . CVA (cerebral infarction) 10/29/2012  . Visual changes 10/28/2012  . Depression 10/28/2012  . Anxiety state, unspecified 10/28/2012  . Anemia, unspecified 10/28/2012  . Nonspecific (abnormal) findings on radiological and other examination of gastrointestinal tract 01/25/2012  . Hyponatremia 01/24/2012  . Hematuria 01/24/2012  . Enteritis 12/24/2011  .  Abdominal pain, other specified site 12/24/2011  . Leg pain, right 02/18/2011  . Temporal arteritis ([place]) 09/01/2010  . Hypertension 09/01/2010  . Hyperlipidemia 09/01/2010  . History of atrial fibrillation 09/01/2010  . CAD (coronary artery disease) 09/01/2010  . GERD (gastroesophageal reflux disease) 09/01/2010  . Insomnia 09/01/2010    Past Surgical History:  Procedure Laterality Date  . ABDOMINAL HYSTERECTOMY  ~ 1984   vaginally  . BACK SURGERY  7-28yrs ago   X Stop  . BLADDER SUSPENSION  2001  . BREAST BIOPSY Right   . CATARACT EXTRACTION W/ INTRAOCULAR LENS IMPLANT Right ~ 08/2012  . COLONOSCOPY  01/26/2012   Procedure: COLONOSCOPY;  Surgeon: Ladene Artist, MD,FACG;  Location: [facility] ENDOSCOPY;  Service: Endoscopy;  Laterality: N/A;  note the EGD is possible  . CORONARY ANGIOPLASTY WITH STENT PLACEMENT  2006   X 1 stent  . EPIDURAL BLOCK INJECTION    . ESOPHAGOGASTRODUODENOSCOPY  01/26/2012   Procedure: ESOPHAGOGASTRODUODENOSCOPY (EGD);  Surgeon: Ladene Artist, MD,FACG;  Location: [facility] ENDOSCOPY;  Service: Endoscopy;  Laterality: N/A;  . HEMIARTHROPLASTY HIP Right 2012  . LAPAROSCOPIC CHOLECYSTECTOMY  2001  . LUMBAR FUSION  03/2013  . LUMBAR LAMINECTOMY/DECOMPRESSION MICRODISCECTOMY Right 11/04/2015   Procedure: Right Lumbar Five-Sacral One Microdiskectomy;  Surgeon: Kristeen Miss, MD;  Location: [facility] NEURO ORS;  Service: Neurosurgery;  Laterality: Right;  Right L5-S1 Microdiskectomy  . moles removed that required stiches     one on leg and one on face  . TEMPORAL ARTERY BIOPSY / LIGATION Bilateral 2011  . TONSILLECTOMY AND ADENOIDECTOMY     at age 79  . TUBAL LIGATION  ~ 1982  . X-STOP IMPLANTATION  ~ 2010   "lower back" (10/28/2012)     OB History   No obstetric history on file.      Home Medications    Prior to Admission medications   Medication Sig Start Date End Date Taking? Authorizing Provider  apixaban (ELIQUIS) 5 MG TABS tablet Take 1 tablet (5 mg total) by  mouth 2 (two) times daily. 07/06/17  Yes Imogene Burn, PA-C  calcium carbonate (OS-CAL) 600 MG TABS Take 600 mg by mouth daily.   Yes [provider]  cholecalciferol (VITAMIN D) 1000 UNITS tablet Take 1,000 Units by mouth daily.    Yes [provider]  escitalopram (LEXAPRO) 10 MG tablet Take 1 tablet (10 mg total) by mouth daily. 02/18/18  Yes Burchette, Alinda Sierras, MD  furosemide (LASIX) 20 MG tablet Take 40 mg by mouth 2 (two) times daily.  10/11/17  Yes [provider]  losartan (COZAAR) 100 MG tablet TAKE 1 TABLET BY MOUTH EVERY DAY 02/18/18  Yes Burchette, Alinda Sierras, MD  metoprolol tartrate (LOPRESSOR) 50 MG tablet Take 1.5 tablets (75 mg total) by mouth 2 (two) times daily. Take one extra tab as needed for rapid heart rate over 100. 07/07/17  Yes Imogene Burn, PA-C  potassium chloride SA (K-DUR,KLOR-CON) 20 MEQ tablet Take 1 tablet (20  mEq total) by mouth daily. 06/08/17  Yes Burchette, Alinda Sierras, MD  predniSONE (DELTASONE) 10 MG tablet 4 tablets for 3 days, 3 tablets for 3 days, 2 tablets for 3 days, 1 tablet continue 05/26/17  Yes Abrol, Ascencion Dike, MD  rosuvastatin (CRESTOR) 10 MG tablet Take 1 tablet (10 mg total) by mouth daily. 07/06/17  Yes Imogene Burn, PA-C  temazepam (RESTORIL) 15 MG capsule Take 1 capsule (15 mg total) by mouth at bedtime as needed for sleep. 02/18/18  Yes Burchette, Alinda Sierras, MD  vitamin C (ASCORBIC ACID) 500 MG tablet Take 1,000 mg by mouth daily.    Yes [provider]  vitamin E (VITAMIN E) 400 UNIT capsule Take 400 Units by mouth daily.   Yes [provider]  HYDROcodone-acetaminophen (NORCO) 5-325 MG tablet Take 1 tablet by mouth every 6 (six) hours as needed. 01/17/18   Burchette, Alinda Sierras, MD    Family History Family History  Problem Relation Age of Onset  . Brain cancer Mother 105  . Heart attack Father   . Breast cancer Daughter 64  . Heart disease Other   . Hypertension Other   . Arthritis Neg Hx   . Colon  cancer Neg Hx   . Osteoporosis Neg Hx     Social History Social History   Tobacco Use  . Smoking status: Never Smoker  . Smokeless tobacco: Never Used  Substance Use Topics  . Alcohol use: Yes    Comment: socially  . Drug use: No     Allergies   Dextromethorphan   Review of Systems Review of Systems  All other systems negative except as documented in the HPI. All pertinent positives and negatives as reviewed in the HPI. Physical Exam Updated Vital Signs BP (!) 157/85   Pulse 77   Temp 98.8 F (37.1 C) (Oral)   Resp 20   Ht 5\' 4"  (1.626 m)   Wt 74.4 kg   SpO2 97%   BMI 28.15 kg/m   Physical Exam Vitals signs and nursing note reviewed.  Constitutional:      General: She is not in acute distress.    Appearance: She is well-developed.  HENT:     Head: Normocephalic and atraumatic.  Eyes:     Pupils: Pupils are equal, round, and reactive to light.  Pulmonary:     Effort: Pulmonary effort is normal.  Musculoskeletal:     Right hand: She exhibits tenderness.       Hands:  Skin:    General: Skin is warm and dry.  Neurological:     Mental Status: She is alert and oriented to person, place, and time.      ED Treatments / Results  Labs (all labs ordered are listed, but only abnormal results are displayed) Labs Reviewed - No data to display  EKG None  Radiology Dg Hand Complete Right  Result Date: 04/13/2018 CLINICAL DATA:  Recent trip and fall with hand pain, initial encounter EXAM: RIGHT HAND - COMPLETE 3+ VIEW COMPARISON:  None. FINDINGS: Degenerative changes are noted in the interphalangeal and metacarpophalangeal joints. Degenerative change of the first [facility] joint is noted as well. No acute fracture or dislocation is noted. No soft tissue abnormality is noted. IMPRESSION: Degenerative changes without acute abnormality. Electronically Signed   By: Inez Catalina M.D.   On: 04/13/2018 13:36    Procedures Procedures (including critical care  time)  Medications Ordered in ED Medications  oxyCODONE-acetaminophen (PERCOCET/ROXICET) 5-325 MG per tablet (has no  administration in time range)  oxyCODONE-acetaminophen (PERCOCET/ROXICET) 5-325 MG per tablet 1 tablet (1 tablet Oral Given 04/13/18 1452)     Initial Impression / Assessment and Plan / ED Course  I have reviewed the triage vital signs and the nursing notes.  Pertinent labs & imaging results that were available during my care of the patient were reviewed by me and considered in my medical decision making (see chart for details).     Patient does not have any fractures noted on x-ray.  I did also review the x-rays and agree with the radiologist findings.  Will place the patient in an Ace wrap for support and comfort.  Patient is angry that we only gave her Percocet as she states that will not help.  Patient be advised to follow-up with her doctor.  Ice and elevate the hand.  Final Clinical Impressions(s) / ED Diagnoses   Final diagnoses:  None    ED Discharge Orders    None       Dalia Heading, PA-C 04/13/18 1503    Tegeler, Gwenyth Allegra, MD 04/13/18 1540

## 2018-04-13 NOTE — Discharge Instructions (Addendum)
Ice and elevate your hand.  Follow-up with your primary doctor.  The x-rays did not show any broken bones.  If you feel that you need to you can take 2 hydrocodone every 4-6 hours temporarily.

## 2018-04-13 NOTE — Telephone Encounter (Signed)
Copied from [place] [id number]. Topic: General - Other >> Apr 13, 2018  3:43 PM Keene Breath wrote: Reason for CRM: Patient's daughter called to speak with the nurse regarding her mother's condition.  Please advise and call back at [phone]

## 2018-04-13 NOTE — ED Notes (Signed)
Patient transported to X-ray 

## 2018-04-13 NOTE — ED Triage Notes (Signed)
Pt had a mechanical fall yesterday and has hand pain and swelling today. Pt is on eliquis

## 2018-04-14 NOTE — Telephone Encounter (Signed)
Patient's daughter calling to speak with Megan. States that she has a couple questions for her. Please advise.

## 2018-04-15 NOTE — Telephone Encounter (Signed)
Called daughter [name] and gave her message from Dr. Elease Hashimoto. Melissa verbalized an understanding. Melissa will relay the message to patient.

## 2018-04-15 NOTE — Telephone Encounter (Signed)
She just got #60 hydrocodone from neurosurgical practice in late December.  Would really try to avoid escalation in use.  Is she using a wrist splint?  If not, immobilization should help.  Also, she should be elevating and icing periodically to see if this helps.

## 2018-04-15 NOTE — Telephone Encounter (Signed)
Called daughter Betty Jackson and she stated that [name] fell the day before yesterday and she hurt her wrist. Went to ER and stated no break and she went through the hydrocodone that was prescribed, her hydrocodone and now is taking Betty Jackson hydrocodone because nothing is helping the pain.  Patient is scheduled to see Dr. Brien Few on 04/21/18 at 2:30pm.  Betty Jackson is asking for some pain meds to help [name] get to this appointment.   Please advise.

## 2018-04-21 DIAGNOSIS — M47816 Spondylosis without myelopathy or radiculopathy, lumbar region: Secondary | ICD-10-CM | POA: Diagnosis not present

## 2018-04-21 DIAGNOSIS — Z79899 Other long term (current) drug therapy: Secondary | ICD-10-CM | POA: Diagnosis not present

## 2018-04-21 DIAGNOSIS — M961 Postlaminectomy syndrome, not elsewhere classified: Secondary | ICD-10-CM | POA: Diagnosis not present

## 2018-04-25 ENCOUNTER — Other Ambulatory Visit: Payer: Self-pay | Admitting: Family Medicine

## 2018-04-25 NOTE — Telephone Encounter (Signed)
Copied from [place]. Topic: Quick Communication - Rx Refill/Question >> Apr 25, 2018  2:41 PM Wynetta Emery, [place] C wrote: Medication: HYDROcodone-acetaminophen ([place]) 5-325 MG tablet  Has the patient contacted their pharmacy? No  (Agent: If no, request that the patient contact the pharmacy for the refill.) (Agent: If yes, when and what did the pharmacy advise?)  Preferred Pharmacy (with phone number or street name): [id number] [address], [place] [phone] (Phone) [phone] (Fax)    Agent: Please be advised that RX refills may take up to 3 business days. We ask that you follow-up with your pharmacy.

## 2018-04-26 ENCOUNTER — Telehealth: Payer: Self-pay | Admitting: Family Medicine

## 2018-04-26 NOTE — Telephone Encounter (Signed)
ok 

## 2018-04-26 NOTE — Telephone Encounter (Signed)
We had discussed our recommendation to follow up with physiatrist/ chronic pain specialist with her back surgeon group for further back pain management.

## 2018-04-26 NOTE — Telephone Encounter (Signed)
Referral has been faxed to the number provided.  Mikal Plane and gave her the verbal OK per Dr. Elease Hashimoto. Adrienne verbalized an understanding.

## 2018-04-26 NOTE — Telephone Encounter (Signed)
Copied from [employer] [id number]. Topic: Quick Communication - Home Health Verbal Orders >> Apr 26, 2018  8:26 AM Alanda Slim E wrote: Caller/Agency: [facility] Number: [phone] Requesting Home PT evaluation -fax a referral to [phone] Attention TEAM 4 Frequency: Eval

## 2018-04-26 NOTE — Telephone Encounter (Signed)
Last OV 04/06/18, No future OV  Last filled 01/17/18, # 20 with 0 refills

## 2018-04-26 NOTE — Telephone Encounter (Signed)
Please advise 

## 2018-04-27 DIAGNOSIS — Z8673 Personal history of transient ischemic attack (TIA), and cerebral infarction without residual deficits: Secondary | ICD-10-CM | POA: Diagnosis not present

## 2018-04-27 DIAGNOSIS — M48061 Spinal stenosis, lumbar region without neurogenic claudication: Secondary | ICD-10-CM | POA: Diagnosis not present

## 2018-04-27 DIAGNOSIS — M1990 Unspecified osteoarthritis, unspecified site: Secondary | ICD-10-CM | POA: Diagnosis not present

## 2018-04-27 DIAGNOSIS — D649 Anemia, unspecified: Secondary | ICD-10-CM | POA: Diagnosis not present

## 2018-04-27 DIAGNOSIS — M810 Age-related osteoporosis without current pathological fracture: Secondary | ICD-10-CM | POA: Diagnosis not present

## 2018-04-27 DIAGNOSIS — N183 Chronic kidney disease, stage 3 (moderate): Secondary | ICD-10-CM | POA: Diagnosis not present

## 2018-04-27 DIAGNOSIS — E785 Hyperlipidemia, unspecified: Secondary | ICD-10-CM | POA: Diagnosis not present

## 2018-04-27 DIAGNOSIS — I129 Hypertensive chronic kidney disease with stage 1 through stage 4 chronic kidney disease, or unspecified chronic kidney disease: Secondary | ICD-10-CM | POA: Diagnosis not present

## 2018-04-27 DIAGNOSIS — Z981 Arthrodesis status: Secondary | ICD-10-CM | POA: Diagnosis not present

## 2018-04-27 DIAGNOSIS — I2510 Atherosclerotic heart disease of native coronary artery without angina pectoris: Secondary | ICD-10-CM | POA: Diagnosis not present

## 2018-04-27 DIAGNOSIS — M069 Rheumatoid arthritis, unspecified: Secondary | ICD-10-CM | POA: Diagnosis not present

## 2018-04-27 DIAGNOSIS — Z7901 Long term (current) use of anticoagulants: Secondary | ICD-10-CM | POA: Diagnosis not present

## 2018-04-27 DIAGNOSIS — M419 Scoliosis, unspecified: Secondary | ICD-10-CM | POA: Diagnosis not present

## 2018-04-27 DIAGNOSIS — Z9181 History of falling: Secondary | ICD-10-CM | POA: Diagnosis not present

## 2018-04-27 DIAGNOSIS — M5127 Other intervertebral disc displacement, lumbosacral region: Secondary | ICD-10-CM | POA: Diagnosis not present

## 2018-04-27 DIAGNOSIS — I480 Paroxysmal atrial fibrillation: Secondary | ICD-10-CM | POA: Diagnosis not present

## 2018-04-27 DIAGNOSIS — K219 Gastro-esophageal reflux disease without esophagitis: Secondary | ICD-10-CM | POA: Diagnosis not present

## 2018-04-27 NOTE — Telephone Encounter (Signed)
[facility] calling and they are needing most recent OV and H&P faxed - they did not receive this with most recent referral 04/26/2018 Fax [phone]  ATTN: [place] that I will fax this information ASAP

## 2018-04-28 ENCOUNTER — Telehealth: Payer: Self-pay | Admitting: Family Medicine

## 2018-04-28 NOTE — Telephone Encounter (Signed)
Copied from [place] [id number]. Topic: Quick Communication - Home Health Verbal Orders >> Apr 28, 2018 10:19 AM Rutherford Nail, NT wrote: Caller/Agency: [facility] - [facility] Number: [phone] Requesting OT/PT/Skilled Nursing/Social Work: Physical therapy Frequency:  2x a week for 5 weeks

## 2018-04-29 DIAGNOSIS — M48061 Spinal stenosis, lumbar region without neurogenic claudication: Secondary | ICD-10-CM | POA: Diagnosis not present

## 2018-04-29 DIAGNOSIS — Z7901 Long term (current) use of anticoagulants: Secondary | ICD-10-CM | POA: Diagnosis not present

## 2018-04-29 DIAGNOSIS — N183 Chronic kidney disease, stage 3 (moderate): Secondary | ICD-10-CM | POA: Diagnosis not present

## 2018-04-29 DIAGNOSIS — E785 Hyperlipidemia, unspecified: Secondary | ICD-10-CM | POA: Diagnosis not present

## 2018-04-29 DIAGNOSIS — I2510 Atherosclerotic heart disease of native coronary artery without angina pectoris: Secondary | ICD-10-CM | POA: Diagnosis not present

## 2018-04-29 DIAGNOSIS — Z9181 History of falling: Secondary | ICD-10-CM | POA: Diagnosis not present

## 2018-04-29 DIAGNOSIS — M5127 Other intervertebral disc displacement, lumbosacral region: Secondary | ICD-10-CM | POA: Diagnosis not present

## 2018-04-29 DIAGNOSIS — K219 Gastro-esophageal reflux disease without esophagitis: Secondary | ICD-10-CM | POA: Diagnosis not present

## 2018-04-29 DIAGNOSIS — M419 Scoliosis, unspecified: Secondary | ICD-10-CM | POA: Diagnosis not present

## 2018-04-29 DIAGNOSIS — I480 Paroxysmal atrial fibrillation: Secondary | ICD-10-CM | POA: Diagnosis not present

## 2018-04-29 DIAGNOSIS — Z8673 Personal history of transient ischemic attack (TIA), and cerebral infarction without residual deficits: Secondary | ICD-10-CM | POA: Diagnosis not present

## 2018-04-29 DIAGNOSIS — M1990 Unspecified osteoarthritis, unspecified site: Secondary | ICD-10-CM | POA: Diagnosis not present

## 2018-04-29 DIAGNOSIS — Z981 Arthrodesis status: Secondary | ICD-10-CM | POA: Diagnosis not present

## 2018-04-29 DIAGNOSIS — D649 Anemia, unspecified: Secondary | ICD-10-CM | POA: Diagnosis not present

## 2018-04-29 DIAGNOSIS — I129 Hypertensive chronic kidney disease with stage 1 through stage 4 chronic kidney disease, or unspecified chronic kidney disease: Secondary | ICD-10-CM | POA: Diagnosis not present

## 2018-04-29 DIAGNOSIS — M810 Age-related osteoporosis without current pathological fracture: Secondary | ICD-10-CM | POA: Diagnosis not present

## 2018-04-29 DIAGNOSIS — M069 Rheumatoid arthritis, unspecified: Secondary | ICD-10-CM | POA: Diagnosis not present

## 2018-04-29 NOTE — Telephone Encounter (Signed)
Please advise 

## 2018-04-29 NOTE — Telephone Encounter (Signed)
Called Adriane and left a voice message to let her know Dr. Elease Hashimoto gave the OK per PT orders.

## 2018-04-29 NOTE — Telephone Encounter (Signed)
OK 

## 2018-05-03 DIAGNOSIS — K219 Gastro-esophageal reflux disease without esophagitis: Secondary | ICD-10-CM | POA: Diagnosis not present

## 2018-05-03 DIAGNOSIS — Z85828 Personal history of other malignant neoplasm of skin: Secondary | ICD-10-CM | POA: Diagnosis not present

## 2018-05-03 DIAGNOSIS — I129 Hypertensive chronic kidney disease with stage 1 through stage 4 chronic kidney disease, or unspecified chronic kidney disease: Secondary | ICD-10-CM | POA: Diagnosis not present

## 2018-05-03 DIAGNOSIS — M48061 Spinal stenosis, lumbar region without neurogenic claudication: Secondary | ICD-10-CM | POA: Diagnosis not present

## 2018-05-03 DIAGNOSIS — Z9181 History of falling: Secondary | ICD-10-CM | POA: Diagnosis not present

## 2018-05-03 DIAGNOSIS — M5127 Other intervertebral disc displacement, lumbosacral region: Secondary | ICD-10-CM | POA: Diagnosis not present

## 2018-05-03 DIAGNOSIS — M069 Rheumatoid arthritis, unspecified: Secondary | ICD-10-CM | POA: Diagnosis not present

## 2018-05-03 DIAGNOSIS — Z8673 Personal history of transient ischemic attack (TIA), and cerebral infarction without residual deficits: Secondary | ICD-10-CM | POA: Diagnosis not present

## 2018-05-03 DIAGNOSIS — I2510 Atherosclerotic heart disease of native coronary artery without angina pectoris: Secondary | ICD-10-CM | POA: Diagnosis not present

## 2018-05-03 DIAGNOSIS — I480 Paroxysmal atrial fibrillation: Secondary | ICD-10-CM | POA: Diagnosis not present

## 2018-05-03 DIAGNOSIS — N183 Chronic kidney disease, stage 3 (moderate): Secondary | ICD-10-CM | POA: Diagnosis not present

## 2018-05-03 DIAGNOSIS — L820 Inflamed seborrheic keratosis: Secondary | ICD-10-CM | POA: Diagnosis not present

## 2018-05-03 DIAGNOSIS — Z981 Arthrodesis status: Secondary | ICD-10-CM | POA: Diagnosis not present

## 2018-05-03 DIAGNOSIS — L821 Other seborrheic keratosis: Secondary | ICD-10-CM | POA: Diagnosis not present

## 2018-05-03 DIAGNOSIS — M1990 Unspecified osteoarthritis, unspecified site: Secondary | ICD-10-CM | POA: Diagnosis not present

## 2018-05-03 DIAGNOSIS — L570 Actinic keratosis: Secondary | ICD-10-CM | POA: Diagnosis not present

## 2018-05-03 DIAGNOSIS — M419 Scoliosis, unspecified: Secondary | ICD-10-CM | POA: Diagnosis not present

## 2018-05-03 DIAGNOSIS — Z7901 Long term (current) use of anticoagulants: Secondary | ICD-10-CM | POA: Diagnosis not present

## 2018-05-03 DIAGNOSIS — D649 Anemia, unspecified: Secondary | ICD-10-CM | POA: Diagnosis not present

## 2018-05-03 DIAGNOSIS — E785 Hyperlipidemia, unspecified: Secondary | ICD-10-CM | POA: Diagnosis not present

## 2018-05-03 DIAGNOSIS — M810 Age-related osteoporosis without current pathological fracture: Secondary | ICD-10-CM | POA: Diagnosis not present

## 2018-05-04 DIAGNOSIS — I129 Hypertensive chronic kidney disease with stage 1 through stage 4 chronic kidney disease, or unspecified chronic kidney disease: Secondary | ICD-10-CM | POA: Diagnosis not present

## 2018-05-04 DIAGNOSIS — Z8673 Personal history of transient ischemic attack (TIA), and cerebral infarction without residual deficits: Secondary | ICD-10-CM | POA: Diagnosis not present

## 2018-05-04 DIAGNOSIS — M069 Rheumatoid arthritis, unspecified: Secondary | ICD-10-CM | POA: Diagnosis not present

## 2018-05-04 DIAGNOSIS — M5127 Other intervertebral disc displacement, lumbosacral region: Secondary | ICD-10-CM | POA: Diagnosis not present

## 2018-05-04 DIAGNOSIS — E785 Hyperlipidemia, unspecified: Secondary | ICD-10-CM | POA: Diagnosis not present

## 2018-05-04 DIAGNOSIS — M419 Scoliosis, unspecified: Secondary | ICD-10-CM | POA: Diagnosis not present

## 2018-05-04 DIAGNOSIS — K219 Gastro-esophageal reflux disease without esophagitis: Secondary | ICD-10-CM | POA: Diagnosis not present

## 2018-05-04 DIAGNOSIS — M810 Age-related osteoporosis without current pathological fracture: Secondary | ICD-10-CM | POA: Diagnosis not present

## 2018-05-04 DIAGNOSIS — N183 Chronic kidney disease, stage 3 (moderate): Secondary | ICD-10-CM | POA: Diagnosis not present

## 2018-05-04 DIAGNOSIS — Z981 Arthrodesis status: Secondary | ICD-10-CM | POA: Diagnosis not present

## 2018-05-04 DIAGNOSIS — D649 Anemia, unspecified: Secondary | ICD-10-CM | POA: Diagnosis not present

## 2018-05-04 DIAGNOSIS — M48061 Spinal stenosis, lumbar region without neurogenic claudication: Secondary | ICD-10-CM | POA: Diagnosis not present

## 2018-05-04 DIAGNOSIS — M1990 Unspecified osteoarthritis, unspecified site: Secondary | ICD-10-CM | POA: Diagnosis not present

## 2018-05-04 DIAGNOSIS — I2510 Atherosclerotic heart disease of native coronary artery without angina pectoris: Secondary | ICD-10-CM | POA: Diagnosis not present

## 2018-05-04 DIAGNOSIS — Z9181 History of falling: Secondary | ICD-10-CM | POA: Diagnosis not present

## 2018-05-04 DIAGNOSIS — Z7901 Long term (current) use of anticoagulants: Secondary | ICD-10-CM | POA: Diagnosis not present

## 2018-05-04 DIAGNOSIS — I480 Paroxysmal atrial fibrillation: Secondary | ICD-10-CM | POA: Diagnosis not present

## 2018-05-12 ENCOUNTER — Telehealth: Payer: Self-pay | Admitting: *Deleted

## 2018-05-12 NOTE — Telephone Encounter (Signed)
Patient with diagnosis of afib on Eliquis for anticoagulation.    Procedure: L4-L5, S1 FACET INJECTION  Date of procedure: TBD  CHADS2-VASc score of  5 (CHF, HTN, AGE, DM2, stroke/tia x 2, CAD, AGE, female) Patient has CVA listed on her problem list, however it was felt the patient had a flare of her temporal arteritis and a punctate stroke seen on MRI may be a manifestation of the temporal arteritis.  Per office protocol, patient can hold Eliquis for 3 days prior to procedure.

## 2018-05-12 NOTE — Telephone Encounter (Signed)
   [facility] Pre-operative Risk Assessment    Request for surgical clearance:  1. What type of surgery is being performed? L4-L5, S1 FACET INJECTION   2. When is this surgery scheduled? TBD   3. What type of clearance is required (medical clearance vs. Pharmacy clearance to hold med vs. Both)? BOTH  4. Are there any medications that need to be held prior to surgery and how long?ELIQUIS HOLD 3 DAYS PRIOR   5. Practice name and name of physician performing surgery? [facility]; DR. Gwenlyn Perking BARTKO   6. What is your office phone number [phone]    7.   What is your office fax number [phone]  8.   Anesthesia type (None, local, MAC, general) ? LOCAL 1% LIDOCAINE    Betty Jackson 05/12/2018, 12:33 PM  _________________________________________________________________   (provider comments below)

## 2018-05-13 DIAGNOSIS — Z9181 History of falling: Secondary | ICD-10-CM | POA: Diagnosis not present

## 2018-05-13 DIAGNOSIS — I480 Paroxysmal atrial fibrillation: Secondary | ICD-10-CM | POA: Diagnosis not present

## 2018-05-13 DIAGNOSIS — K219 Gastro-esophageal reflux disease without esophagitis: Secondary | ICD-10-CM | POA: Diagnosis not present

## 2018-05-13 DIAGNOSIS — D649 Anemia, unspecified: Secondary | ICD-10-CM | POA: Diagnosis not present

## 2018-05-13 DIAGNOSIS — N183 Chronic kidney disease, stage 3 (moderate): Secondary | ICD-10-CM | POA: Diagnosis not present

## 2018-05-13 DIAGNOSIS — M48061 Spinal stenosis, lumbar region without neurogenic claudication: Secondary | ICD-10-CM | POA: Diagnosis not present

## 2018-05-13 DIAGNOSIS — Z7901 Long term (current) use of anticoagulants: Secondary | ICD-10-CM | POA: Diagnosis not present

## 2018-05-13 DIAGNOSIS — E785 Hyperlipidemia, unspecified: Secondary | ICD-10-CM | POA: Diagnosis not present

## 2018-05-13 DIAGNOSIS — M069 Rheumatoid arthritis, unspecified: Secondary | ICD-10-CM | POA: Diagnosis not present

## 2018-05-13 DIAGNOSIS — M1990 Unspecified osteoarthritis, unspecified site: Secondary | ICD-10-CM | POA: Diagnosis not present

## 2018-05-13 DIAGNOSIS — I129 Hypertensive chronic kidney disease with stage 1 through stage 4 chronic kidney disease, or unspecified chronic kidney disease: Secondary | ICD-10-CM | POA: Diagnosis not present

## 2018-05-13 DIAGNOSIS — Z981 Arthrodesis status: Secondary | ICD-10-CM | POA: Diagnosis not present

## 2018-05-13 DIAGNOSIS — Z8673 Personal history of transient ischemic attack (TIA), and cerebral infarction without residual deficits: Secondary | ICD-10-CM | POA: Diagnosis not present

## 2018-05-13 DIAGNOSIS — M5127 Other intervertebral disc displacement, lumbosacral region: Secondary | ICD-10-CM | POA: Diagnosis not present

## 2018-05-13 DIAGNOSIS — M419 Scoliosis, unspecified: Secondary | ICD-10-CM | POA: Diagnosis not present

## 2018-05-13 DIAGNOSIS — I2510 Atherosclerotic heart disease of native coronary artery without angina pectoris: Secondary | ICD-10-CM | POA: Diagnosis not present

## 2018-05-13 DIAGNOSIS — M810 Age-related osteoporosis without current pathological fracture: Secondary | ICD-10-CM | POA: Diagnosis not present

## 2018-05-13 NOTE — Telephone Encounter (Signed)
   Primary Cardiologist: [name], MD  Chart reviewed as part of pre-operative protocol coverage. Patient was contacted 05/13/2018 in reference to pre-operative risk assessment for pending surgery as outlined below.  Betty Jackson was last seen on 01/06/18 by Dr. Angelena Form.  Since that day, Betty Jackson has done well, with the exception of back pain. She denies changes to her medical history, new anginal symptoms, and her mobility is limited only by her back pain. She can complete 4.0 METS if allowed to rest for her back.  Per our pharmacy staff: Patient with diagnosis of afib on Eliquis for anticoagulation.    Procedure: L4-L5, S1 FACET INJECTION Date of procedure: TBD  CHADS2-VASc score of  5 (CHF, HTN, AGE, DM2, stroke/tia x 2, CAD, AGE, female) Patient has CVA listed on her problem list, however it was felt the patient had a flare of her temporal arteritis and a punctate stroke seen on MRI may be a manifestation of the temporal arteritis.  Per office protocol, patient can hold Eliquis for 3 days prior to procedure.    Therefore, based on ACC/AHA guidelines, the patient would be at acceptable risk for the planned procedure without further cardiovascular testing.   I will route this recommendation to the requesting party via Epic fax function and remove from pre-op pool.  Please call with questions.  Ledora Bottcher, PA 05/13/2018, 4:41 PM

## 2018-05-13 NOTE — Telephone Encounter (Signed)
Left voice mail to call back 

## 2018-05-15 ENCOUNTER — Other Ambulatory Visit: Payer: Self-pay | Admitting: Family Medicine

## 2018-05-16 NOTE — Telephone Encounter (Signed)
Last OV 04/06/18, No future OV  Not currently on med list, looks like an RN deleted. Is patient currently on this medication? Please advise if OK to fill or deny.

## 2018-05-16 NOTE — Telephone Encounter (Signed)
This same issue keeps coming up for her.  If not on her list, would not refill.   The only thing I know to do is to verify with patient whether she has continued to take medication.

## 2018-05-17 DIAGNOSIS — D649 Anemia, unspecified: Secondary | ICD-10-CM | POA: Diagnosis not present

## 2018-05-17 DIAGNOSIS — Z9181 History of falling: Secondary | ICD-10-CM | POA: Diagnosis not present

## 2018-05-17 DIAGNOSIS — I2510 Atherosclerotic heart disease of native coronary artery without angina pectoris: Secondary | ICD-10-CM | POA: Diagnosis not present

## 2018-05-17 DIAGNOSIS — I129 Hypertensive chronic kidney disease with stage 1 through stage 4 chronic kidney disease, or unspecified chronic kidney disease: Secondary | ICD-10-CM | POA: Diagnosis not present

## 2018-05-17 DIAGNOSIS — M1990 Unspecified osteoarthritis, unspecified site: Secondary | ICD-10-CM | POA: Diagnosis not present

## 2018-05-17 DIAGNOSIS — I480 Paroxysmal atrial fibrillation: Secondary | ICD-10-CM | POA: Diagnosis not present

## 2018-05-17 DIAGNOSIS — M069 Rheumatoid arthritis, unspecified: Secondary | ICD-10-CM | POA: Diagnosis not present

## 2018-05-17 DIAGNOSIS — M810 Age-related osteoporosis without current pathological fracture: Secondary | ICD-10-CM | POA: Diagnosis not present

## 2018-05-17 DIAGNOSIS — M419 Scoliosis, unspecified: Secondary | ICD-10-CM | POA: Diagnosis not present

## 2018-05-17 DIAGNOSIS — N183 Chronic kidney disease, stage 3 (moderate): Secondary | ICD-10-CM | POA: Diagnosis not present

## 2018-05-17 DIAGNOSIS — Z8673 Personal history of transient ischemic attack (TIA), and cerebral infarction without residual deficits: Secondary | ICD-10-CM | POA: Diagnosis not present

## 2018-05-17 DIAGNOSIS — K219 Gastro-esophageal reflux disease without esophagitis: Secondary | ICD-10-CM | POA: Diagnosis not present

## 2018-05-17 DIAGNOSIS — Z7901 Long term (current) use of anticoagulants: Secondary | ICD-10-CM | POA: Diagnosis not present

## 2018-05-17 DIAGNOSIS — Z981 Arthrodesis status: Secondary | ICD-10-CM | POA: Diagnosis not present

## 2018-05-17 DIAGNOSIS — M48061 Spinal stenosis, lumbar region without neurogenic claudication: Secondary | ICD-10-CM | POA: Diagnosis not present

## 2018-05-17 DIAGNOSIS — E785 Hyperlipidemia, unspecified: Secondary | ICD-10-CM | POA: Diagnosis not present

## 2018-05-17 DIAGNOSIS — M5127 Other intervertebral disc displacement, lumbosacral region: Secondary | ICD-10-CM | POA: Diagnosis not present

## 2018-05-18 NOTE — Telephone Encounter (Signed)
This has been discontinued per her cardiologist.

## 2018-05-20 DIAGNOSIS — Z9181 History of falling: Secondary | ICD-10-CM | POA: Diagnosis not present

## 2018-05-20 DIAGNOSIS — M069 Rheumatoid arthritis, unspecified: Secondary | ICD-10-CM | POA: Diagnosis not present

## 2018-05-20 DIAGNOSIS — E785 Hyperlipidemia, unspecified: Secondary | ICD-10-CM | POA: Diagnosis not present

## 2018-05-20 DIAGNOSIS — M419 Scoliosis, unspecified: Secondary | ICD-10-CM | POA: Diagnosis not present

## 2018-05-20 DIAGNOSIS — D649 Anemia, unspecified: Secondary | ICD-10-CM | POA: Diagnosis not present

## 2018-05-20 DIAGNOSIS — I129 Hypertensive chronic kidney disease with stage 1 through stage 4 chronic kidney disease, or unspecified chronic kidney disease: Secondary | ICD-10-CM | POA: Diagnosis not present

## 2018-05-20 DIAGNOSIS — M810 Age-related osteoporosis without current pathological fracture: Secondary | ICD-10-CM | POA: Diagnosis not present

## 2018-05-20 DIAGNOSIS — I2510 Atherosclerotic heart disease of native coronary artery without angina pectoris: Secondary | ICD-10-CM | POA: Diagnosis not present

## 2018-05-20 DIAGNOSIS — I480 Paroxysmal atrial fibrillation: Secondary | ICD-10-CM | POA: Diagnosis not present

## 2018-05-20 DIAGNOSIS — M48061 Spinal stenosis, lumbar region without neurogenic claudication: Secondary | ICD-10-CM | POA: Diagnosis not present

## 2018-05-20 DIAGNOSIS — Z8673 Personal history of transient ischemic attack (TIA), and cerebral infarction without residual deficits: Secondary | ICD-10-CM | POA: Diagnosis not present

## 2018-05-20 DIAGNOSIS — K219 Gastro-esophageal reflux disease without esophagitis: Secondary | ICD-10-CM | POA: Diagnosis not present

## 2018-05-20 DIAGNOSIS — M1990 Unspecified osteoarthritis, unspecified site: Secondary | ICD-10-CM | POA: Diagnosis not present

## 2018-05-20 DIAGNOSIS — Z7901 Long term (current) use of anticoagulants: Secondary | ICD-10-CM | POA: Diagnosis not present

## 2018-05-20 DIAGNOSIS — M5127 Other intervertebral disc displacement, lumbosacral region: Secondary | ICD-10-CM | POA: Diagnosis not present

## 2018-05-20 DIAGNOSIS — N183 Chronic kidney disease, stage 3 (moderate): Secondary | ICD-10-CM | POA: Diagnosis not present

## 2018-05-20 DIAGNOSIS — Z981 Arthrodesis status: Secondary | ICD-10-CM | POA: Diagnosis not present

## 2018-05-24 DIAGNOSIS — M47816 Spondylosis without myelopathy or radiculopathy, lumbar region: Secondary | ICD-10-CM | POA: Diagnosis not present

## 2018-05-25 ENCOUNTER — Other Ambulatory Visit: Payer: Self-pay | Admitting: Cardiovascular Disease

## 2018-05-27 DIAGNOSIS — I129 Hypertensive chronic kidney disease with stage 1 through stage 4 chronic kidney disease, or unspecified chronic kidney disease: Secondary | ICD-10-CM | POA: Diagnosis not present

## 2018-05-27 DIAGNOSIS — K219 Gastro-esophageal reflux disease without esophagitis: Secondary | ICD-10-CM | POA: Diagnosis not present

## 2018-05-27 DIAGNOSIS — I2510 Atherosclerotic heart disease of native coronary artery without angina pectoris: Secondary | ICD-10-CM | POA: Diagnosis not present

## 2018-05-27 DIAGNOSIS — Z7901 Long term (current) use of anticoagulants: Secondary | ICD-10-CM | POA: Diagnosis not present

## 2018-05-27 DIAGNOSIS — Z9181 History of falling: Secondary | ICD-10-CM | POA: Diagnosis not present

## 2018-05-27 DIAGNOSIS — M069 Rheumatoid arthritis, unspecified: Secondary | ICD-10-CM | POA: Diagnosis not present

## 2018-05-27 DIAGNOSIS — N183 Chronic kidney disease, stage 3 (moderate): Secondary | ICD-10-CM | POA: Diagnosis not present

## 2018-05-27 DIAGNOSIS — M810 Age-related osteoporosis without current pathological fracture: Secondary | ICD-10-CM | POA: Diagnosis not present

## 2018-05-27 DIAGNOSIS — E785 Hyperlipidemia, unspecified: Secondary | ICD-10-CM | POA: Diagnosis not present

## 2018-05-27 DIAGNOSIS — I480 Paroxysmal atrial fibrillation: Secondary | ICD-10-CM | POA: Diagnosis not present

## 2018-05-27 DIAGNOSIS — M48061 Spinal stenosis, lumbar region without neurogenic claudication: Secondary | ICD-10-CM | POA: Diagnosis not present

## 2018-05-27 DIAGNOSIS — Z981 Arthrodesis status: Secondary | ICD-10-CM | POA: Diagnosis not present

## 2018-05-27 DIAGNOSIS — D649 Anemia, unspecified: Secondary | ICD-10-CM | POA: Diagnosis not present

## 2018-05-27 DIAGNOSIS — M5127 Other intervertebral disc displacement, lumbosacral region: Secondary | ICD-10-CM | POA: Diagnosis not present

## 2018-05-27 DIAGNOSIS — Z8673 Personal history of transient ischemic attack (TIA), and cerebral infarction without residual deficits: Secondary | ICD-10-CM | POA: Diagnosis not present

## 2018-05-27 DIAGNOSIS — M1990 Unspecified osteoarthritis, unspecified site: Secondary | ICD-10-CM | POA: Diagnosis not present

## 2018-05-27 DIAGNOSIS — M419 Scoliosis, unspecified: Secondary | ICD-10-CM | POA: Diagnosis not present

## 2018-05-30 ENCOUNTER — Telehealth: Payer: Self-pay | Admitting: Family Medicine

## 2018-05-30 NOTE — Telephone Encounter (Signed)
Called patient and LMOVM   Ok for PEC to Discuss results / PCP / recommendations / Schedule patient  Left a detailed message per Dr. Elease Hashimoto: OK for PT, 2X a week for 4 weeks.  CRM Created.

## 2018-05-30 NOTE — Telephone Encounter (Signed)
Please advise 

## 2018-05-30 NOTE — Telephone Encounter (Signed)
Copied from [place] [id number]. Topic: Quick Communication - Home Health Verbal Orders >> May 30, 2018  3:25 PM Berneta Levins wrote: Caller/AgencyTerri Piedra at [place] [facility] Number: [phone], [place] to leave a message Requesting OT/PT/Skilled Nursing/Social Work: PT Frequency: 2x a week for 4 weeks.

## 2018-05-30 NOTE — Telephone Encounter (Signed)
ok 

## 2018-06-01 DIAGNOSIS — M1990 Unspecified osteoarthritis, unspecified site: Secondary | ICD-10-CM | POA: Diagnosis not present

## 2018-06-01 DIAGNOSIS — E785 Hyperlipidemia, unspecified: Secondary | ICD-10-CM | POA: Diagnosis not present

## 2018-06-01 DIAGNOSIS — M48061 Spinal stenosis, lumbar region without neurogenic claudication: Secondary | ICD-10-CM | POA: Diagnosis not present

## 2018-06-01 DIAGNOSIS — D649 Anemia, unspecified: Secondary | ICD-10-CM | POA: Diagnosis not present

## 2018-06-01 DIAGNOSIS — M5127 Other intervertebral disc displacement, lumbosacral region: Secondary | ICD-10-CM | POA: Diagnosis not present

## 2018-06-01 DIAGNOSIS — M069 Rheumatoid arthritis, unspecified: Secondary | ICD-10-CM | POA: Diagnosis not present

## 2018-06-01 DIAGNOSIS — Z8673 Personal history of transient ischemic attack (TIA), and cerebral infarction without residual deficits: Secondary | ICD-10-CM | POA: Diagnosis not present

## 2018-06-01 DIAGNOSIS — M810 Age-related osteoporosis without current pathological fracture: Secondary | ICD-10-CM | POA: Diagnosis not present

## 2018-06-01 DIAGNOSIS — N183 Chronic kidney disease, stage 3 (moderate): Secondary | ICD-10-CM | POA: Diagnosis not present

## 2018-06-01 DIAGNOSIS — Z7901 Long term (current) use of anticoagulants: Secondary | ICD-10-CM | POA: Diagnosis not present

## 2018-06-01 DIAGNOSIS — Z9181 History of falling: Secondary | ICD-10-CM | POA: Diagnosis not present

## 2018-06-01 DIAGNOSIS — I129 Hypertensive chronic kidney disease with stage 1 through stage 4 chronic kidney disease, or unspecified chronic kidney disease: Secondary | ICD-10-CM | POA: Diagnosis not present

## 2018-06-01 DIAGNOSIS — I480 Paroxysmal atrial fibrillation: Secondary | ICD-10-CM | POA: Diagnosis not present

## 2018-06-01 DIAGNOSIS — K219 Gastro-esophageal reflux disease without esophagitis: Secondary | ICD-10-CM | POA: Diagnosis not present

## 2018-06-01 DIAGNOSIS — Z981 Arthrodesis status: Secondary | ICD-10-CM | POA: Diagnosis not present

## 2018-06-01 DIAGNOSIS — M419 Scoliosis, unspecified: Secondary | ICD-10-CM | POA: Diagnosis not present

## 2018-06-01 DIAGNOSIS — I2510 Atherosclerotic heart disease of native coronary artery without angina pectoris: Secondary | ICD-10-CM | POA: Diagnosis not present

## 2018-06-02 DIAGNOSIS — Z981 Arthrodesis status: Secondary | ICD-10-CM | POA: Diagnosis not present

## 2018-06-02 DIAGNOSIS — I480 Paroxysmal atrial fibrillation: Secondary | ICD-10-CM | POA: Diagnosis not present

## 2018-06-02 DIAGNOSIS — K219 Gastro-esophageal reflux disease without esophagitis: Secondary | ICD-10-CM | POA: Diagnosis not present

## 2018-06-02 DIAGNOSIS — Z9181 History of falling: Secondary | ICD-10-CM | POA: Diagnosis not present

## 2018-06-02 DIAGNOSIS — Z7901 Long term (current) use of anticoagulants: Secondary | ICD-10-CM | POA: Diagnosis not present

## 2018-06-02 DIAGNOSIS — I129 Hypertensive chronic kidney disease with stage 1 through stage 4 chronic kidney disease, or unspecified chronic kidney disease: Secondary | ICD-10-CM | POA: Diagnosis not present

## 2018-06-02 DIAGNOSIS — M069 Rheumatoid arthritis, unspecified: Secondary | ICD-10-CM | POA: Diagnosis not present

## 2018-06-02 DIAGNOSIS — M48061 Spinal stenosis, lumbar region without neurogenic claudication: Secondary | ICD-10-CM | POA: Diagnosis not present

## 2018-06-02 DIAGNOSIS — E785 Hyperlipidemia, unspecified: Secondary | ICD-10-CM | POA: Diagnosis not present

## 2018-06-02 DIAGNOSIS — Z8673 Personal history of transient ischemic attack (TIA), and cerebral infarction without residual deficits: Secondary | ICD-10-CM | POA: Diagnosis not present

## 2018-06-02 DIAGNOSIS — M5127 Other intervertebral disc displacement, lumbosacral region: Secondary | ICD-10-CM | POA: Diagnosis not present

## 2018-06-02 DIAGNOSIS — M1990 Unspecified osteoarthritis, unspecified site: Secondary | ICD-10-CM | POA: Diagnosis not present

## 2018-06-02 DIAGNOSIS — M419 Scoliosis, unspecified: Secondary | ICD-10-CM | POA: Diagnosis not present

## 2018-06-02 DIAGNOSIS — N183 Chronic kidney disease, stage 3 (moderate): Secondary | ICD-10-CM | POA: Diagnosis not present

## 2018-06-02 DIAGNOSIS — I2510 Atherosclerotic heart disease of native coronary artery without angina pectoris: Secondary | ICD-10-CM | POA: Diagnosis not present

## 2018-06-02 DIAGNOSIS — D649 Anemia, unspecified: Secondary | ICD-10-CM | POA: Diagnosis not present

## 2018-06-02 DIAGNOSIS — M810 Age-related osteoporosis without current pathological fracture: Secondary | ICD-10-CM | POA: Diagnosis not present

## 2018-06-03 ENCOUNTER — Telehealth: Payer: Self-pay | Admitting: *Deleted

## 2018-06-03 NOTE — Telephone Encounter (Signed)
This clearance appears to have been addressed in phone note from 05/12/18.

## 2018-06-03 NOTE — Telephone Encounter (Signed)
I have re faxed clearance from 05/12/18. Will remove duplicate encounter from pre op pool.

## 2018-06-03 NOTE — Telephone Encounter (Signed)
   [facility] Pre-operative Risk Assessment    Request for surgical clearance:  1. What type of surgery is being performed? CONFIRMATORY MEDIAL BRANCH BLOCK RIGHT L4-5 and L5-S1  2. When is this surgery scheduled? TBD   3. What type of clearance is required (medical clearance vs. Pharmacy clearance to hold med vs. Both)? BOTH  4. Are there any medications that need to be held prior to surgery and how long?ELQIUIS X 3 DAYS PRIOR   5. Practice name and name of physician performing surgery? [facility]; DR. BARTKO   6. What is your office phone number [phone]    7.   What is your office fax number [phone]  8.   Anesthesia type (None, local, MAC, general) ? LEFT MESSAGE WITH DR. Buford Dresser 06/03/2018, 2:04 PM  _________________________________________________________________   (provider comments below)

## 2018-06-06 DIAGNOSIS — I2510 Atherosclerotic heart disease of native coronary artery without angina pectoris: Secondary | ICD-10-CM | POA: Diagnosis not present

## 2018-06-06 DIAGNOSIS — N183 Chronic kidney disease, stage 3 (moderate): Secondary | ICD-10-CM | POA: Diagnosis not present

## 2018-06-06 DIAGNOSIS — Z981 Arthrodesis status: Secondary | ICD-10-CM | POA: Diagnosis not present

## 2018-06-06 DIAGNOSIS — I129 Hypertensive chronic kidney disease with stage 1 through stage 4 chronic kidney disease, or unspecified chronic kidney disease: Secondary | ICD-10-CM | POA: Diagnosis not present

## 2018-06-06 DIAGNOSIS — M48061 Spinal stenosis, lumbar region without neurogenic claudication: Secondary | ICD-10-CM | POA: Diagnosis not present

## 2018-06-06 DIAGNOSIS — Z9181 History of falling: Secondary | ICD-10-CM | POA: Diagnosis not present

## 2018-06-06 DIAGNOSIS — I480 Paroxysmal atrial fibrillation: Secondary | ICD-10-CM | POA: Diagnosis not present

## 2018-06-06 DIAGNOSIS — M069 Rheumatoid arthritis, unspecified: Secondary | ICD-10-CM | POA: Diagnosis not present

## 2018-06-06 DIAGNOSIS — D649 Anemia, unspecified: Secondary | ICD-10-CM | POA: Diagnosis not present

## 2018-06-06 DIAGNOSIS — K219 Gastro-esophageal reflux disease without esophagitis: Secondary | ICD-10-CM | POA: Diagnosis not present

## 2018-06-06 DIAGNOSIS — E785 Hyperlipidemia, unspecified: Secondary | ICD-10-CM | POA: Diagnosis not present

## 2018-06-06 DIAGNOSIS — M1990 Unspecified osteoarthritis, unspecified site: Secondary | ICD-10-CM | POA: Diagnosis not present

## 2018-06-06 DIAGNOSIS — M419 Scoliosis, unspecified: Secondary | ICD-10-CM | POA: Diagnosis not present

## 2018-06-06 DIAGNOSIS — M5127 Other intervertebral disc displacement, lumbosacral region: Secondary | ICD-10-CM | POA: Diagnosis not present

## 2018-06-06 DIAGNOSIS — Z8673 Personal history of transient ischemic attack (TIA), and cerebral infarction without residual deficits: Secondary | ICD-10-CM | POA: Diagnosis not present

## 2018-06-06 DIAGNOSIS — M810 Age-related osteoporosis without current pathological fracture: Secondary | ICD-10-CM | POA: Diagnosis not present

## 2018-06-06 DIAGNOSIS — Z7901 Long term (current) use of anticoagulants: Secondary | ICD-10-CM | POA: Diagnosis not present

## 2018-06-07 ENCOUNTER — Telehealth: Payer: Self-pay | Admitting: Family Medicine

## 2018-06-07 NOTE — Telephone Encounter (Signed)
Please see message. Advise patient to contact pain management?  Please advise.

## 2018-06-07 NOTE — Telephone Encounter (Signed)
I would be happy to set up referral to chronic pain management if she decides not to do the procedure.

## 2018-06-07 NOTE — Telephone Encounter (Signed)
I would prefer that they stay in the loop with her back pain management.

## 2018-06-07 NOTE — Telephone Encounter (Signed)
Called patient and gave her message. Patient stated that she has called Dr. Lauris Poag office on Monday and they have not returned her call. Patient stated that they were not doing anything in the way of medications and patient stated that she is in desperation to have some help due to the severe pain. She stated that Dr. Brien Few wants to do something to kill the nerve and she is nervous about this possible procedure.  Please advise.

## 2018-06-07 NOTE — Telephone Encounter (Signed)
Copied from [place] [id number]. Topic: General - Other >> Jun 07, 2018 10:03 AM Lennox Solders wrote: Reason for CRM: pt is still having back pain and has seen pain management and they only gave her shots in back  2 wk ago no relief back is worst. Pt would like to know if dr Elease Hashimoto would gave her hydrocodone for the pain. [facility] [address]

## 2018-06-08 ENCOUNTER — Telehealth: Payer: Self-pay | Admitting: Cardiovascular Disease

## 2018-06-08 ENCOUNTER — Telehealth: Payer: Self-pay | Admitting: Cardiology

## 2018-06-08 NOTE — Telephone Encounter (Signed)
I spoke with pt and gave her instructions from Dr. Angelena Form

## 2018-06-08 NOTE — Telephone Encounter (Signed)
I spoke with pt. She increased lasix to 40 mg twice daily about 5 days ago due to swelling in ankles/legs.  Usually this will help the swelling but this time swelling has not improved.  Prior to this she was taking lasix 20 mg twice daily. Does not weigh daily. No increased salt intake recently.

## 2018-06-08 NOTE — Telephone Encounter (Signed)
Called patient and gave her message from Dr. Elease Hashimoto. Patient verbalized an understanding. Advised patient to contact Dr. Brien Few to let them know that the injection did not help and to see if he can explain the procedure more. Patient verbalized an understanding.

## 2018-06-08 NOTE — Telephone Encounter (Signed)
Pt is calling to resolve question about Furosemide  Pt c/o medication issue:  1. Name of Medication: Furosemide   2. How are you currently taking this medication (dosage and times per day)? 1 tablet (40 mg) 2x daily  3. Are you having a reaction (difficulty breathing--STAT)?  no  4. What is your medication issue? Swelling in ankles. Wants to know if it is OK to increase dosage. Was told in the past that if swelling occurred, she was to take  a full pill twice a day until swelling goes down. She has been doing that, but swelling is still an issue.    Call cell [phone] if Home phone unavailable

## 2018-06-08 NOTE — Telephone Encounter (Signed)
   Primary Cardiologist: [name], MD  Chart reviewed as part of pre-operative protocol coverage. Patient was contacted 06/08/2018 in reference to pre-operative risk assessment for pending surgery as outlined below.  Cassey L Engelhard was last seen on 01/06/18 by Dr. Angelena Form.  Since that day, Betty Jackson has done well with no changes in her physical activity except for back pain.  Therefore, based on ACC/AHA guidelines, the patient would be at acceptable risk for the planned procedure without further cardiovascular testing.  CHADS2-VASc score of  5 (CHF, HTN, AGE, DM2, stroke/tia x 2, CAD, AGE, female)   Per office protocol, patient can hold Eliquis for 3 days prior to procedure.    I will route this recommendation to the requesting party via Epic fax function and remove from pre-op pool.  Please call with questions.  Cecilie Kicks, NP 06/08/2018, 4:57 PM

## 2018-06-08 NOTE — Telephone Encounter (Signed)
I would have her increase her Lasix to 40 mg po TID for three days then go down to 40 mg twice daily until her swelling is down. Elevate legs and watch her salt intake. Thanks, chris

## 2018-06-15 DIAGNOSIS — I10 Essential (primary) hypertension: Secondary | ICD-10-CM | POA: Diagnosis not present

## 2018-06-15 DIAGNOSIS — M47816 Spondylosis without myelopathy or radiculopathy, lumbar region: Secondary | ICD-10-CM | POA: Diagnosis not present

## 2018-06-20 ENCOUNTER — Telehealth: Payer: Self-pay | Admitting: Family Medicine

## 2018-06-20 NOTE — Telephone Encounter (Signed)
Copied from [place] [phone]. Topic: Quick Communication - See Telephone Encounter >> Jun 20, 2018  3:17 PM Loma Boston wrote: CRM for notification. See Telephone encounter for: 06/20/18. Daughter needs to talk to Dr Elease Hashimoto, mother is in extreme back pain and can not get out of bed, [name] 's family has a positive Covid family member. She (Melissa) wants Dr B to call and see if he can prescribe something for mothers back pain, only other solution is to call 911 but she is afraid of the exposure to Isabel.Marland Kitchen. in a desperate situation...wants to talk to Dr B he understands and their situation is crital please call her at [phone]

## 2018-06-20 NOTE — Telephone Encounter (Signed)
Please see message. °

## 2018-06-21 NOTE — Telephone Encounter (Signed)
Spoke with daughter at some length.  She had injection with Dr Brien Few last week.  They did not feel she was a great candidate for opioid therapy apparently secondary to lack of prior response.  They are encouraged to give Dr Orpah Melter some feedback regarding lack of response to recent steroid injection.

## 2018-06-24 ENCOUNTER — Telehealth: Payer: Self-pay

## 2018-06-24 NOTE — Telephone Encounter (Signed)
FYI  Copied from [name] [phone]. Topic: General - Other >> Jun 24, 2018 10:50 AM Keene Breath wrote: Reason for CRM: Gracie with [facility] called to inform the doctor  that the patient cancelled all of her PT visits  this week.  If there are any questions, please call at [phone]

## 2018-06-28 ENCOUNTER — Other Ambulatory Visit: Payer: Self-pay | Admitting: Physician Assistant

## 2018-06-29 ENCOUNTER — Other Ambulatory Visit: Payer: Self-pay | Admitting: Physician Assistant

## 2018-07-01 ENCOUNTER — Other Ambulatory Visit: Payer: Self-pay | Admitting: Family Medicine

## 2018-07-01 NOTE — Telephone Encounter (Signed)
Requested medication (s) are due for refill today: Yes  Requested medication (s) are on the active medication list: Yes  Last refill:  01/17/18  Future visit scheduled: No  Notes to clinic: Unable to refill, cannot delegate     Requested Prescriptions  Pending Prescriptions Disp Refills   HYDROcodone-acetaminophen (NORCO) 5-325 MG tablet 20 tablet 0    Sig: Take 1 tablet by mouth every 6 (six) hours as needed.     Not Delegated - Analgesics:  Opioid Agonist Combinations Failed - 07/01/2018  3:49 PM      Failed - This refill cannot be delegated      Failed - Urine Drug Screen completed in last 360 days.      Passed - Valid encounter within last 6 months    Recent Outpatient Visits          2 months ago Chronic bilateral low back pain without sciatica   Therapist, [facility] at [place], MD   4 months ago Essential hypertension   Therapist, [facility] at [facility], Alinda Sierras, MD   5 months ago Chronic insomnia   Therapist, [facility] at [facility], Alinda Sierras, MD   8 months ago Bilateral leg edema   Therapist, [facility] at [place], MD   11 months ago Bilateral lower extremity edema   Therapist, [facility] at [facility], Langley Adie, MD

## 2018-07-04 ENCOUNTER — Other Ambulatory Visit: Payer: Self-pay | Admitting: Physician Assistant

## 2018-07-04 NOTE — Telephone Encounter (Signed)
We have not been prescribing this.  She is followed [facility].

## 2018-07-04 NOTE — Telephone Encounter (Signed)
Age 84, weight 74kg, SCr 0.99 on 12/08/17, indication - afib

## 2018-07-05 ENCOUNTER — Telehealth: Payer: Self-pay | Admitting: Cardiovascular Disease

## 2018-07-05 ENCOUNTER — Telehealth: Payer: Self-pay | Admitting: Family Medicine

## 2018-07-05 NOTE — Telephone Encounter (Signed)
No I have not

## 2018-07-05 NOTE — Telephone Encounter (Signed)
   Primary Cardiologist: [name], MD  Chart reviewed as part of pre-operative protocol coverage. Patient was contacted 06/08/2018 in reference to pre-operative risk assessment for pending surgery as outlined below.  Betty Jackson was last seen on 01/06/18 by Dr. Angelena Form.  Since that day, Betty Jackson has done well with no changes in her physical activity except for back pain.  Therefore, based on ACC/AHA guidelines, the patient would be at acceptable risk for the planned procedure without further cardiovascular testing.  CHADS2-VASc score of5(CHF,HTN, AGE, DM2, stroke/tia x 2, CAD, AGE, female)   Per office protocol, patient can holdEliquisfor 3days prior to procedure.   I will route this recommendation to the requesting party via Epic fax function and remove from pre-op pool.  Please call with questions.  Cecilie Kicks, NP 06/08/2018, 4:57 PM   I am re-faxing this to number provided in clearance from 06/08/18 and 05/12/18 [ [phone]] via [facility] fax.

## 2018-07-05 NOTE — Telephone Encounter (Signed)
Patient request a call back about her hydrocodone refill. [phone]

## 2018-07-05 NOTE — Telephone Encounter (Signed)
I called patient and she stated that when Dr. Elease Hashimoto came out to the car to see Mr. Whitmyer arm he told September that if she had Dr. Brien Few send a message that stated that he has not sent in any Hydrocodone then Dr. Elease Hashimoto would fill the Hydrocodone prescription.  Please advise. Have you received letter from Dr. Brien Few?

## 2018-07-05 NOTE — Telephone Encounter (Signed)
Called patient and gave her the message. Patient stated that she is due to have an ablation done with Dr. Brien Few next week and I advised patient to reach back out to Dr. Brien Few to see what they can do until her procedure. Patient verbalized an understanding and stated that she will try them again.

## 2018-07-05 NOTE — Telephone Encounter (Signed)
New Message   Clearance has already been submitted and Dr. Angelena Form has gotten it done office waiting for it to be faxed to [phone].

## 2018-07-06 ENCOUNTER — Telehealth: Payer: Self-pay | Admitting: Family Medicine

## 2018-07-06 DIAGNOSIS — Z79899 Other long term (current) drug therapy: Secondary | ICD-10-CM | POA: Diagnosis not present

## 2018-07-06 DIAGNOSIS — M545 Low back pain: Secondary | ICD-10-CM | POA: Diagnosis not present

## 2018-07-06 DIAGNOSIS — M316 Other giant cell arteritis: Secondary | ICD-10-CM | POA: Diagnosis not present

## 2018-07-06 DIAGNOSIS — I509 Heart failure, unspecified: Secondary | ICD-10-CM | POA: Diagnosis not present

## 2018-07-06 NOTE — Telephone Encounter (Signed)
I received the fax from [place] from Dr. Lauris Poag office. Aryn stated that it is written in the document that Dr. Brien Few will not prescribe opiods for patient.   Paperwork has been placed in red folder on your desk for review.  Please advise.

## 2018-07-06 NOTE — Telephone Encounter (Signed)
Erin from Dr. Brien Few called and stated that she is going to fax the note over again. She sent last Thursday but we did not receive.

## 2018-07-06 NOTE — Telephone Encounter (Signed)
Copied from [place] [id number]. Topic: Quick Communication - See Telephone Encounter >> Jul 06, 2018  9:25 AM Robina Ade, Helene Kelp D wrote: CRM for notification. See Telephone encounter for: 07/06/18. Patient daughter called and would like to talk to [facility] regarding a letter that the office should be getting from Dr. Ross Marcus office at [facility]. She has called [facility] and ask them to have note ready for her to take back to office. Patient is in a lot of pain and she is in need of pain medication. Please call patient daughter  Betty Jackson [phone] back she would like to talk to [place] at the office.

## 2018-07-07 ENCOUNTER — Ambulatory Visit (INDEPENDENT_AMBULATORY_CARE_PROVIDER_SITE_OTHER): Payer: Medicare Other | Admitting: Family Medicine

## 2018-07-07 ENCOUNTER — Other Ambulatory Visit: Payer: Self-pay

## 2018-07-07 DIAGNOSIS — G8929 Other chronic pain: Secondary | ICD-10-CM | POA: Diagnosis not present

## 2018-07-07 DIAGNOSIS — M545 Low back pain: Secondary | ICD-10-CM | POA: Diagnosis not present

## 2018-07-07 DIAGNOSIS — F5104 Psychophysiologic insomnia: Secondary | ICD-10-CM | POA: Diagnosis not present

## 2018-07-07 MED ORDER — HYDROCODONE-ACETAMINOPHEN 5-325 MG PO TABS: 1.0000 | ORAL_TABLET | Freq: Three times a day (TID) | ORAL | 0 refills | Status: DC | PRN

## 2018-07-07 NOTE — Telephone Encounter (Signed)
Called patient to set up one month follow up. Not able to leave a message.  OK for [facility] to discuss/advise/schedule patient  CRM Created

## 2018-07-07 NOTE — Telephone Encounter (Signed)
Doxy appointment is set up at 10am this morning.

## 2018-07-07 NOTE — Telephone Encounter (Signed)
See if we can set up Doxy appt to discuss.  Set up for 30 minutes.

## 2018-07-07 NOTE — Progress Notes (Signed)
Patient ID: Betty Jackson, female   DOB: 09/21/1934, 84 y.o.   MRN: [id number]  Virtual Visit via Video Note  I connected with Betty Jackson on 07/07/18 at 10:00 AM EDT by a video enabled telemedicine application and verified that I am speaking with the correct person using two identifiers.  Location patient: home Location provider:work or home office Persons participating in the virtual visit: patient, provider  I discussed the limitations of evaluation and management by telemedicine and the availability of in person appointments. The patient expressed understanding and agreed to proceed.   HPI:  Patient had called several times recently to request pain medication.  She has chronic back pain involving her low back.  We had been waiting to get some notes from her neurosurgical practice.  She is followed by Betty Jackson.  She has had multiple lumbar surgeries including previous discectomy L4-L5 on the left June 2018 and on the right L5-S1 August 2017.  She has history of laminectomy and fusion L2-L3 and L3-L4.  Persistent right lumbar pain.  No radiculitis symptoms.  Onset was over 10 years ago.  She states that her pain is generally 8-10 out of 10 and is greatly affecting her quality of life.  She has been much less ambulatory.  She has consistent increased pain with ambulation.  She has had epidurals a couple of times without much benefit.  There has been discussion of ablation procedure possibly as early as next week.  She initially was undecided regarding that.  Patient has had long discussions with neurosurgical office regarding pain management.  She initially had implied to them that she had not gotten much benefit from oral medication such as hydrocodone.  However, today she states that her pain usually goes from 9-10 out of 10 to 4-5 out of 10 with hydrocodone.  She has not had any constipation or other adverse effects.  She feels her quality of life and functioning have improved with that  (hydrocodone)- but not with OTC medications such as plain Tylenol.  She does not have any chronic lung disease.  She has had history of chronic insomnia and currently takes temazepam 15 mg at night.  We expressed our concerns that we would not recommend mixing benzodiazepine and opioid especially her age.  She has gotten some benefit from Tylenol PM in the past.  She has had extensive physical therapy in the past without much benefit regarding her pain.  No recent fever, appetite change, weight change, dysuria.   ROS: See pertinent positives and negatives per HPI.  Past Medical History:  Diagnosis Date  . Abdominal pain, epigastric 06/22/2013  . Abdominal pain, other specified site 12/24/2011  . Abnormal urinalysis 05/20/2017  . Acute respiratory failure with hypoxia ([place]) 05/20/2017  . Anemia, unspecified 10/28/2012  . Anxiety state, unspecified 10/28/2012  . Bruises easily    d/t being on prednisone and plavix  . CAD (coronary artery disease)    a. Stent RCA in [facility];  b. Cath approx 2009 - nonobs per pt report.  . Cataract    immature on the left eye  . Chronic insomnia 02/07/2013  . Chronic lower back pain    scoliosis  . CKD (chronic kidney disease) stage 3, GFR 30-59 ml/min (HCC)   . Complication of anesthesia    pt has a very high tolerance to meds  . Coronary disease 05/20/2017  . CVA (cerebral infarction) 10/29/2012  . DDD (degenerative disc disease)   . Depression   .  Diverticulitis    hx of  . Diverticulosis   . Elevated transaminase level 02/07/2013  . Enteritis   . GERD (gastroesophageal reflux disease) 09/01/2010  . Giant cell arteritis ([place])   . Hemorrhoids   . Herniated nucleus pulposus, L5-S1, right 11/04/2015  . History of scabies   . HTN (hypertension) 05/20/2017  . Hyperlipidemia    takes Lipitor daily  . Hypertension    takes Amlodipine,Losartan,Metoprolol,and HCTZ daily  . Incontinence of urine   . Influenza A 05/20/2017  . Insomnia    takes  Restoril nightly  . Lumbar stenosis 04/25/2013  . Major depressive disorder, recurrent episode, moderate ([place]) 07/16/2013  . Migraines    "back in my 20's; none since" (10/28/2012)  . Osteoporosis   . PAF (paroxysmal atrial fibrillation) ([place]) 2011   a. lone epidode in 2011 according to notes.  . Rheumatoid arthritis ([place])   . Scoliosis   . Sepsis ([place]) 05/20/2017  . Sinus bradycardia    a. on chronic bb  . Temporal arteritis ([place]) 2011   a. followed @ [facility]; potential flareup 10/28/2012/notes 10/28/2012  . Vocal cord dysfunction    "they don't operate properly" (10/28/2012)    Past Surgical History:  Procedure Laterality Date  . ABDOMINAL HYSTERECTOMY  ~ 1984   vaginally  . BACK SURGERY  7-75yrs ago   X Stop  . BLADDER SUSPENSION  2001  . BREAST BIOPSY Right   . CATARACT EXTRACTION W/ INTRAOCULAR LENS IMPLANT Right ~ 08/2012  . COLONOSCOPY  01/26/2012   Procedure: COLONOSCOPY;  Surgeon: Betty Artist, MD,FACG;  Location[facility] ENDOSCOPY;  Service: Endoscopy;  Laterality: N/A;  note the EGD is possible  . CORONARY ANGIOPLASTY WITH STENT PLACEMENT  2006   X 1 stent  . EPIDURAL BLOCK INJECTION    . ESOPHAGOGASTRODUODENOSCOPY  01/26/2012   Procedure: ESOPHAGOGASTRODUODENOSCOPY (EGD);  Surgeon: Betty Artist, MD,FACG;  Location: [facility] ENDOSCOPY;  Service: Endoscopy;  Laterality: N/A;  . HEMIARTHROPLASTY HIP Right 2012  . LAPAROSCOPIC CHOLECYSTECTOMY  2001  . LUMBAR FUSION  03/2013  . LUMBAR LAMINECTOMY/DECOMPRESSION MICRODISCECTOMY Right 11/04/2015   Procedure: Right Lumbar Five-Sacral One Microdiskectomy;  Surgeon: Betty Miss, MD;  Location: [facility] NEURO ORS;  Service: Jackson;  Laterality: Right;  Right L5-S1 Microdiskectomy  . moles removed that required stiches     one on leg and one on face  . TEMPORAL ARTERY BIOPSY / LIGATION Bilateral 2011  . TONSILLECTOMY AND ADENOIDECTOMY     at age 45  . TUBAL LIGATION  ~ 1982  . X-STOP IMPLANTATION  ~ 2010   "lower back" (10/28/2012)    Family  History  Problem Relation Age of Onset  . Brain cancer Mother 16  . Heart attack Father   . Breast cancer Daughter 69  . Heart disease Other   . Hypertension Other   . Arthritis Neg Hx   . Colon cancer Neg Hx   . Osteoporosis Neg Hx     SOCIAL HX: Non-smoker.  Lives with husband who has multiple health needs.  They have 2 daughters who have help provide for their care   Current Outpatient Medications:  .  calcium carbonate (OS-CAL) 600 MG TABS, Take 600 mg by mouth daily., Disp: , Rfl:  .  cholecalciferol (VITAMIN D) 1000 UNITS tablet, Take 1,000 Units by mouth daily. , Disp: , Rfl:  .  ELIQUIS 5 MG TABS tablet, TAKE 1 TABLET BY MOUTH TWICE A DAY, Disp: 60 tablet, Rfl: 5 .  escitalopram (LEXAPRO) 10  MG tablet, Take 1 tablet (10 mg total) by mouth daily., Disp: 90 tablet, Rfl: 3 .  furosemide (LASIX) 20 MG tablet, Take 40 mg by mouth 2 (two) times daily. , Disp: , Rfl:  .  furosemide (LASIX) 40 MG tablet, Take (20mg ) half tablet by mouth twice daily., Disp: 30 tablet, Rfl: 4 .  HYDROcodone-acetaminophen (NORCO) 5-325 MG tablet, Take 1 tablet by mouth every 8 (eight) hours as needed., Disp: 90 tablet, Rfl: 0 .  losartan (COZAAR) 100 MG tablet, TAKE 1 TABLET BY MOUTH EVERY DAY, Disp: 90 tablet, Rfl: 1 .  metoprolol tartrate (LOPRESSOR) 50 MG tablet, TAKE 1 & 1/2 TABS (75MG ) BY MOUTH 2X DAILY. TAKE 1 EXTRA AS NEEDED FOR RAPID HEART RATE OVER 100., Disp: 90 tablet, Rfl: 2 .  potassium chloride SA (K-DUR,KLOR-CON) 20 MEQ tablet, Take 1 tablet (20 mEq total) by mouth daily., Disp: 90 tablet, Rfl: 1 .  predniSONE (DELTASONE) 10 MG tablet, 4 tablets for 3 days, 3 tablets for 3 days, 2 tablets for 3 days, 1 tablet continue, Disp: 90 tablet, Rfl: 1 .  rosuvastatin (CRESTOR) 10 MG tablet, TAKE 1 TABLET BY MOUTH EVERY DAY, Disp: 90 tablet, Rfl: 3 .  vitamin C (ASCORBIC ACID) 500 MG tablet, Take 1,000 mg by mouth daily. , Disp: , Rfl:  .  vitamin E (VITAMIN E) 400 UNIT capsule, Take 400 Units by mouth  daily., Disp: , Rfl:   Current Facility-Administered Medications:  .  0.9 %  sodium chloride infusion, , Intravenous, Once, Myeshia Fojtik, Alinda Sierras, MD  EXAM:  VITALS per patient if applicable:  GENERAL: alert, oriented, appears well and in no acute distress  HEENT: atraumatic, conjunttiva clear, no obvious abnormalities on inspection of external nose and ears  NECK: normal movements of the head and neck  LUNGS: on inspection no signs of respiratory distress, breathing rate appears normal, no obvious gross SOB, gasping or wheezing  CV: no obvious cyanosis  MS: moves all visible extremities without noticeable abnormality  PSYCH/NEURO: pleasant and cooperative, no obvious depression or anxiety, speech and thought processing grossly intact  ASSESSMENT AND PLAN:  Discussed the following assessment and plan:  Chronic lumbar back pain with multiple surgeries as above.  She has become functionally very limited and very poor quality of life currently.  Patient does states she has gotten benefit from low-dose hydrocodone in the past.  She is requesting consideration for hydrocodone therapy chronically to help her quality of life.  She realizes that if she gets significant improvement with ablation she would consider backing off that medication.  We discussed several issues as follows  -We discussed the fact that balancing risk and benefits with chronic opioid therapy required multiple considerations.  We explained that we would not feel comfortable combining benzodiazepines such as temazepam with any opioid and that she must cease taking the temazepam immediately if we look at low-dose hydrocodone -We explained my reluctance to use higher dose opioids.  Even though she does not have chronic lung disease she has increased risk because of her age.  If not getting significant relief with relatively lower doses of opioid to improve functioning would either offer referral to pain management clinic or d/c  opioid. -We explained that any opioid use would require contract.  Because of current coronavirus pandemic we are trying to keep Patients out of the office but we would need to go over her contract in detail once she is able to get back for follow-up -We explained that we would  have to do drug testing at least once per year and possibly more -We explained that she would have to have a minimum of every 58-month follow-up to review drug registry and that prescriptions can only come from this office in terms of opioids -We discussed potential side effects of opioids not limited to constipation, sedation, increased risk of falls  -She will look at alternative such as Tylenol PM for her insomnia issues but absolutely no temazepam -We agreed to hydrocodone 5 mg / 325 mg 1 every 8 hours #90 with no refill -We will plan to touch base for follow-up in 1 month  Over 30 minutes spent with > 50 % in direct video enabled interface to discuss opioid risks, potential side effects, elements of our drug contract, and follow up.     I discussed the assessment and treatment plan with the patient. The patient was provided an opportunity to ask questions and all were answered. The patient agreed with the plan and demonstrated an understanding of the instructions.   The patient was advised to call back or seek an in-person evaluation if the symptoms worsen or if the condition fails to improve as anticipated.   Carolann Littler, MD

## 2018-07-07 NOTE — Telephone Encounter (Signed)
Called patient and NOT able to LMOVM to return call  [place] for [facility] to Discuss results / PCP / recommendations / Schedule patient  I have scheduled a Doxy telephone meeting with Wendie Agreste with Dr. Elease Hashimoto. We did receive the office notes from Dr. Brien Few and Dr. Elease Hashimoto has reviewed them.  CRM Created.

## 2018-07-10 DIAGNOSIS — G8929 Other chronic pain: Secondary | ICD-10-CM | POA: Insufficient documentation

## 2018-07-10 DIAGNOSIS — M545 Low back pain: Secondary | ICD-10-CM

## 2018-07-13 ENCOUNTER — Other Ambulatory Visit: Payer: Self-pay | Admitting: Cardiovascular Disease

## 2018-07-13 NOTE — Telephone Encounter (Signed)
Pt calling requesting a new Rx for furosemide 40 mg tablet be sent in. Pt stated that she takes furosemide 40 mg BID some days and other days she takes furosemide 40 mg tablet, 1/2 tablet BID and the Dr. Angelena Form knows about this and the pt has ran out of medication because of the last Rx that was sent in. Please clarify how Dr. Angelena Form wants this prescription sent in for the pt to be able to get her medication. Please address

## 2018-07-14 MED ORDER — FUROSEMIDE 40 MG PO TABS: ORAL_TABLET | ORAL | 3 refills | Status: DC

## 2018-07-14 NOTE — Telephone Encounter (Signed)
Advised pt that we will call her Lasix in just as she is taking it per Dr. Angelena Form.

## 2018-07-14 NOTE — Telephone Encounter (Signed)
That change is ok with me. Betty Jackson

## 2018-07-14 NOTE — Telephone Encounter (Signed)
Spoke with the pt and she has been taking her Lasix 40mg  bid and increasing an extra 1/2 (20mg ) bid when she has LE edema...   She says she has been taking this way for several months and she says it seems to work for her.   Will need to be sure this dose change from 20mg  bid is okay with Dr. Angelena Form and advised pt that I will call her back today.

## 2018-07-15 ENCOUNTER — Other Ambulatory Visit: Payer: Self-pay

## 2018-07-15 ENCOUNTER — Ambulatory Visit (INDEPENDENT_AMBULATORY_CARE_PROVIDER_SITE_OTHER): Payer: Medicare Other | Admitting: Family Medicine

## 2018-07-15 DIAGNOSIS — R1010 Upper abdominal pain, unspecified: Secondary | ICD-10-CM | POA: Diagnosis not present

## 2018-07-15 NOTE — Progress Notes (Signed)
Patient ID: Betty Jackson, female   DOB: Dec 31, 1934, 84 y.o.   MRN: [id number]  Virtual Visit via Video Note  I connected with Betty Jackson on 07/15/18 at  3:45 PM EDT by a video enabled telemedicine application and verified that I am speaking with the correct person using two identifiers.  Location patient: home Location provider:work or home office Persons participating in the virtual visit: patient, provider  I discussed the limitations of evaluation and management by telemedicine and the availability of in person appointments. The patient expressed understanding and agreed to proceed.   HPI: Patient called earlier with back and chest pain.  Her back pain is actually very chronic and unchanged.  She is actually not having chest pains as much as bilateral upper abdominal pain.  She states she had onset about a week ago.  Her pain is constant and worse with movement.  Denies any recent fall.  No chest pain.  No dyspnea.  No cough or fever.  Appetite stable.  Denies any nausea or vomiting.  No stool changes.  Denies any melena or constipation.  No relation of pain to eating.  Bowel movements have been relatively normal.  She was started last week on hydrocodone 5/325 mg 1 every 8 hours for chronic back pain.  She has had previous cholecystectomy.  She had been on temazepam and that was discontinued because the hydrocodone.  She has had difficulty sleeping some since stopping the temazepam.  No dysuria.   ROS: See pertinent positives and negatives per HPI.  Past Medical History:  Diagnosis Date  . Abdominal pain, epigastric 06/22/2013  . Abdominal pain, other specified site 12/24/2011  . Abnormal urinalysis 05/20/2017  . Acute respiratory failure with hypoxia ([place]) 05/20/2017  . Anemia, unspecified 10/28/2012  . Anxiety state, unspecified 10/28/2012  . Bruises easily    d/t being on prednisone and plavix  . CAD (coronary artery disease)    a. Stent RCA in [facility];  b. Cath approx 2009 -  nonobs per pt report.  . Cataract    immature on the left eye  . Chronic insomnia 02/07/2013  . Chronic lower back pain    scoliosis  . CKD (chronic kidney disease) stage 3, GFR 30-59 ml/min ([facility])   . Complication of anesthesia    pt has a very high tolerance to meds  . Coronary disease 05/20/2017  . CVA (cerebral infarction) 10/29/2012  . DDD (degenerative disc disease)   . Depression   . Diverticulitis    hx of  . Diverticulosis   . Elevated transaminase level 02/07/2013  . Enteritis   . GERD (gastroesophageal reflux disease) 09/01/2010  . Giant cell arteritis ([place])   . Hemorrhoids   . Herniated nucleus pulposus, L5-S1, right 11/04/2015  . History of scabies   . HTN (hypertension) 05/20/2017  . Hyperlipidemia    takes Lipitor daily  . Hypertension    takes Amlodipine,Losartan,Metoprolol,and HCTZ daily  . Incontinence of urine   . Influenza A 05/20/2017  . Insomnia    takes Restoril nightly  . Lumbar stenosis 04/25/2013  . Major depressive disorder, recurrent episode, moderate ([place]) 07/16/2013  . Migraines    "back in my 20's; none since" (10/28/2012)  . Osteoporosis   . PAF (paroxysmal atrial fibrillation) ([place]) 2011   a. lone epidode in 2011 according to notes.  . Rheumatoid arthritis ([place])   . Scoliosis   . Sepsis ([place]) 05/20/2017  . Sinus bradycardia    a. on chronic bb  .  Temporal arteritis ([place]) 2011   a. followed @ [facility]; potential flareup 10/28/2012/notes 10/28/2012  . Vocal cord dysfunction    "they don't operate properly" (10/28/2012)    Past Surgical History:  Procedure Laterality Date  . ABDOMINAL HYSTERECTOMY  ~ 1984   vaginally  . BACK SURGERY  7-89yrs ago   X Stop  . BLADDER SUSPENSION  2001  . BREAST BIOPSY Right   . CATARACT EXTRACTION W/ INTRAOCULAR LENS IMPLANT Right ~ 08/2012  . COLONOSCOPY  01/26/2012   Procedure: COLONOSCOPY;  Surgeon: Ladene Artist, MD,FACG;  Location[facility] END[facility]COPY;  Service: Endoscopy;  Laterality: N/A;  note the EGD is possible  .  CORONARY ANGIOPLASTY WITH STENT PLACEMENT  2006   X 1 stent  . EPIDURAL BLOCK INJECTION    . ESOPHAGOGASTRODUODENOSCOPY  01/26/2012   Procedure: ESOPHAGOGASTRODUODENOSCOPY (EGD);  Surgeon: Ladene Artist, MD,FACG;  Location: [facility] ENDOSCOPY;  Service: Endoscopy;  Laterality: N/A;  . HEMIARTHROPLASTY HIP Right 2012  . LAPAROSCOPIC CHOLECYSTECTOMY  2001  . LUMBAR FUSION  03/2013  . LUMBAR LAMINECTOMY/DECOMPRESSION MICRODISCECTOMY Right 11/04/2015   Procedure: Right Lumbar Five-Sacral One Microdiskectomy;  Surgeon: Kristeen Miss, MD;  Location: [place] NEURO ORS;  Service: Neurosurgery;  Laterality: Right;  Right L5-S1 Microdiskectomy  . moles removed that required stiches     one on leg and one on face  . TEMPORAL ARTERY BIOPSY / LIGATION Bilateral 2011  . TONSILLECTOMY AND ADENOIDECTOMY     at age 88  . TUBAL LIGATION  ~ 1982  . X-STOP IMPLANTATION  ~ 2010   "lower back" (10/28/2012)    Family History  Problem Relation Age of Onset  . Brain cancer Mother 54  . Heart attack Father   . Breast cancer Daughter 75  . Heart disease Other   . Hypertension Other   . Arthritis Neg Hx   . Colon cancer Neg Hx   . Osteoporosis Neg Hx     SOCIAL HX: She is married.  Lives with her husband.  She has a couple of daughters who provide excellent care.  Non-smoker.   Current Outpatient Medications:  .  calcium carbonate (OS-CAL) 600 MG TABS, Take 600 mg by mouth daily., Disp: , Rfl:  .  cholecalciferol (VITAMIN D) 1000 UNITS tablet, Take 1,000 Units by mouth daily. , Disp: , Rfl:  .  ELIQUIS 5 MG TABS tablet, TAKE 1 TABLET BY MOUTH TWICE A DAY, Disp: 60 tablet, Rfl: 5 .  escitalopram (LEXAPRO) 10 MG tablet, Take 1 tablet (10 mg total) by mouth daily., Disp: 90 tablet, Rfl: 3 .  furosemide (LASIX) 40 MG tablet, Take 1 tablet (40mg ) by mouth twice a day and an extra 1/2 tablet (20mg ) twice a day as needed for edema., Disp: 135 tablet, Rfl: 3 .  HYDROcodone-acetaminophen (NORCO) 5-325 MG tablet, Take 1 tablet  by mouth every 8 (eight) hours as needed., Disp: 90 tablet, Rfl: 0 .  losartan (COZAAR) 100 MG tablet, TAKE 1 TABLET BY MOUTH EVERY DAY, Disp: 90 tablet, Rfl: 1 .  metoprolol tartrate (LOPRESSOR) 50 MG tablet, TAKE 1 & 1/2 TABS (75MG ) BY MOUTH 2X DAILY. TAKE 1 EXTRA AS NEEDED FOR RAPID HEART RATE OVER 100., Disp: 90 tablet, Rfl: 2 .  potassium chloride SA (K-DUR,KLOR-CON) 20 MEQ tablet, Take 1 tablet (20 mEq total) by mouth daily., Disp: 90 tablet, Rfl: 1 .  predniSONE (DELTASONE) 10 MG tablet, 4 tablets for 3 days, 3 tablets for 3 days, 2 tablets for 3 days, 1 tablet continue, Disp:  90 tablet, Rfl: 1 .  rosuvastatin (CRESTOR) 10 MG tablet, TAKE 1 TABLET BY MOUTH EVERY DAY, Disp: 90 tablet, Rfl: 3 .  vitamin C (ASCORBIC ACID) 500 MG tablet, Take 1,000 mg by mouth daily. , Disp: , Rfl:  .  vitamin E (VITAMIN E) 400 UNIT capsule, Take 400 Units by mouth daily., Disp: , Rfl:   Current Facility-Administered Medications:  .  0.9 %  sodium chloride infusion, , Intravenous, Once, Burchette, Alinda Sierras, MD  EXAM:  VITALS per patient if applicable:  GENERAL: alert, oriented, appears well and in no acute distress  HEENT: atraumatic, conjunttiva clear, no obvious abnormalities on inspection of external nose and ears  NECK: normal movements of the head and neck  LUNGS: on inspection no signs of respiratory distress, breathing rate appears normal, no obvious gross SOB, gasping or wheezing  CV: no obvious cyanosis  MS: moves all visible extremities without noticeable abnormality  PSYCH/NEURO: pleasant and cooperative, no obvious depression or anxiety, speech and thought processing grossly intact  ASSESSMENT AND PLAN:  Discussed the following assessment and plan:  Diffuse bilateral upper abdominal pain.  Question musculoskeletal given the fact this is worse with movement and not at rest.  She denies any chest pain.  Question if this is related to compensation from how she is having difficulty  ambulating from her chronic back pain  -We cautioned about use of nonsteroidals. -She is taking regular chronic hydrocodone 5 mg every 8 hours -We educated her regarding things to look for such as fever, vomiting, stool changes, melena, progressive pain, chest pain, dyspnea -Follow-up immediately or call 911 for any the above -Recommend conservative therapy such as heating pad -Consider possible further evaluation with labs and x-rays for any persistent or worsening symptoms    I discussed the assessment and treatment plan with the patient. The patient was provided an opportunity to ask questions and all were answered. The patient agreed with the plan and demonstrated an understanding of the instructions.   The patient was advised to call back or seek an in-person evaluation if the symptoms worsen or if the condition fails to improve as anticipated.   Carolann Littler, MD

## 2018-08-05 ENCOUNTER — Other Ambulatory Visit: Payer: Self-pay

## 2018-08-05 ENCOUNTER — Ambulatory Visit (INDEPENDENT_AMBULATORY_CARE_PROVIDER_SITE_OTHER): Payer: Medicare Other | Admitting: Family Medicine

## 2018-08-05 DIAGNOSIS — M545 Low back pain, unspecified: Secondary | ICD-10-CM

## 2018-08-05 DIAGNOSIS — M47816 Spondylosis without myelopathy or radiculopathy, lumbar region: Secondary | ICD-10-CM | POA: Diagnosis not present

## 2018-08-05 DIAGNOSIS — G8929 Other chronic pain: Secondary | ICD-10-CM | POA: Diagnosis not present

## 2018-08-05 MED ORDER — HYDROCODONE-ACETAMINOPHEN 5-325 MG PO TABS: ORAL_TABLET | ORAL | 0 refills | Status: DC

## 2018-08-05 MED ORDER — HYDROCODONE-ACETAMINOPHEN 5-325 MG PO TABS: 1.0000 | ORAL_TABLET | Freq: Three times a day (TID) | ORAL | 0 refills | Status: DC | PRN

## 2018-08-05 NOTE — Progress Notes (Signed)
Patient ID: Betty Jackson, female   DOB: January 22, 1935, 84 y.o.   MRN: [id number]  This visit type was conducted due to national recommendations for restrictions regarding the COVID-19 pandemic in an effort to limit this patient's exposure and mitigate transmission in our community.   Virtual Visit via Telephone Note  I connected with Helina Hullum on 08/05/18 at  1:45 PM EDT by telephone and verified that I am speaking with the correct person using two identifiers.   I discussed the limitations, risks, security and privacy concerns of performing an evaluation and management service by telephone and the availability of in person appointments. I also discussed with the patient that there may be a patient responsible charge related to this service. The patient expressed understanding and agreed to proceed.  Location patient: home Location provider: work or home office Participants present for the call: patient, provider Patient did not have a visit in the prior 7 days to address this/these issue(s).   History of Present Illness: Patient has chronic back pain with multiple prior surgeries.  She states she underwent ablation procedure this morning and that she thinks went well.  She was started recently on hydrocodone 5 mg 3 times daily.  She definitely thinks that is helping her back pain.  She still has frequent pain that is 5-7 out of 10 in severity.  Denies any constipation or other side effects.  She is hoping the ablation procedure will help.   Observations/Objective: Patient sounds cheerful and well on the phone. I do not appreciate any SOB. Speech and thought processing are grossly intact. Patient reported vitals:  Assessment and Plan: Chronic back pain -We agreed to refill her hydrocodone for 3 months. -If she is getting further back and improvements with her ablation will consider tapering back off. -Our plan is for 82-month follow-up for office visit.  We will need to review her drug  contract at that time, do drug screening  Follow Up Instructions:  -As above  I did not refer this patient for an OV in the next 24 hours for this/these issue(s).  I discussed the assessment and treatment plan with the patient. The patient was provided an opportunity to ask questions and all were answered. The patient agreed with the plan and demonstrated an understanding of the instructions.   The patient was advised to call back or seek an in-person evaluation if the symptoms worsen or if the condition fails to improve as anticipated.  I provided 14 minutes of non-face-to-face time during this encounter.   Carolann Littler, MD

## 2018-08-17 ENCOUNTER — Other Ambulatory Visit: Payer: Self-pay

## 2018-08-17 ENCOUNTER — Emergency Department (HOSPITAL_BASED_OUTPATIENT_CLINIC_OR_DEPARTMENT_OTHER)
Admission: EM | Admit: 2018-08-17 | Discharge: 2018-08-17 | Disposition: A | Discharge status: Home / Self Care | Payer: Medicare Other | Attending: Emergency Medicine | Admitting: Emergency Medicine

## 2018-08-17 ENCOUNTER — Emergency Department (HOSPITAL_BASED_OUTPATIENT_CLINIC_OR_DEPARTMENT_OTHER): Payer: Medicare Other

## 2018-08-17 ENCOUNTER — Encounter (HOSPITAL_BASED_OUTPATIENT_CLINIC_OR_DEPARTMENT_OTHER): Payer: Self-pay

## 2018-08-17 DIAGNOSIS — Z79899 Other long term (current) drug therapy: Secondary | ICD-10-CM | POA: Diagnosis not present

## 2018-08-17 DIAGNOSIS — M545 Low back pain, unspecified: Secondary | ICD-10-CM

## 2018-08-17 DIAGNOSIS — I129 Hypertensive chronic kidney disease with stage 1 through stage 4 chronic kidney disease, or unspecified chronic kidney disease: Secondary | ICD-10-CM | POA: Diagnosis not present

## 2018-08-17 DIAGNOSIS — M25551 Pain in right hip: Secondary | ICD-10-CM

## 2018-08-17 DIAGNOSIS — Z955 Presence of coronary angioplasty implant and graft: Secondary | ICD-10-CM | POA: Diagnosis not present

## 2018-08-17 DIAGNOSIS — S79912A Unspecified injury of left hip, initial encounter: Secondary | ICD-10-CM | POA: Diagnosis not present

## 2018-08-17 DIAGNOSIS — S79911A Unspecified injury of right hip, initial encounter: Secondary | ICD-10-CM | POA: Diagnosis not present

## 2018-08-17 DIAGNOSIS — Z7901 Long term (current) use of anticoagulants: Secondary | ICD-10-CM | POA: Insufficient documentation

## 2018-08-17 DIAGNOSIS — I2510 Atherosclerotic heart disease of native coronary artery without angina pectoris: Secondary | ICD-10-CM | POA: Diagnosis not present

## 2018-08-17 DIAGNOSIS — W010XXA Fall on same level from slipping, tripping and stumbling without subsequent striking against object, initial encounter: Secondary | ICD-10-CM | POA: Insufficient documentation

## 2018-08-17 DIAGNOSIS — N183 Chronic kidney disease, stage 3 (moderate): Secondary | ICD-10-CM | POA: Insufficient documentation

## 2018-08-17 DIAGNOSIS — M25552 Pain in left hip: Secondary | ICD-10-CM | POA: Diagnosis not present

## 2018-08-17 DIAGNOSIS — S0990XA Unspecified injury of head, initial encounter: Secondary | ICD-10-CM | POA: Diagnosis not present

## 2018-08-17 DIAGNOSIS — S3992XA Unspecified injury of lower back, initial encounter: Secondary | ICD-10-CM | POA: Diagnosis not present

## 2018-08-17 MED ORDER — OXYCODONE-ACETAMINOPHEN 5-325 MG PO TABS
1.0000 | ORAL_TABLET | Freq: Once | ORAL | Status: AC
  Administered 2018-08-17: 18:00:00 1 via ORAL
  Filled 2018-08-17: qty 1

## 2018-08-17 MED ORDER — LIDOCAINE 5 % EX PTCH
1.0000 | MEDICATED_PATCH | CUTANEOUS | Status: DC
  Filled 2018-08-17: qty 1

## 2018-08-17 MED ORDER — LIDOCAINE 5 % EX PTCH: 1.0000 | MEDICATED_PATCH | CUTANEOUS | 0 refills | Status: DC

## 2018-08-17 NOTE — ED Notes (Signed)
Patient transported to X-ray 

## 2018-08-17 NOTE — Discharge Instructions (Addendum)
You have been diagnosed today with acute on chronic right-sided lower back and right hip pain  At this time there does not appear to be the presence of an emergent medical condition, however there is always the potential for conditions to change. Please read and follow the below instructions.  Please return to the Emergency Department immediately for any new or worsening symptoms. Please be sure to follow up with your Primary Care Provider within one week regarding your visit today; please call their office to schedule an appointment even if you are feeling better for a follow-up visit. Please call your spine surgeon/orthopedic doctor tomorrow to schedule follow-up regarding your pain.  You may continue to use your home narcotic medication as prescribed to help with your pain.  Additionally you have been prescribed Lidoderm patches today that you may use on your right hip to help with the pain. Your x-ray of your lower back today showed new height loss of your L1 and T11 vertebrae, please discuss this with your primary care provider.  Additionally there was a lucency seen in your L2 and L3 vertebral bodies, this will need a CT scan for further evaluation as outpatient.  Discussed this with your primary care provider as well as with your surgeon to have this CT scan scheduled.  Get help right away if: You have weakness or numbness in one or both of your legs or feet. You have trouble controlling your bladder or your bowels. You have nausea or vomiting. You have pain in your abdomen. You have shortness of breath or you faint. Get help right away if: You fall. You have a sudden increase in pain and swelling in your hip. Your hip is red or swollen or very tender to touch. You have fever Any new/concerning or worsening symptoms  Please read the additional information packets attached to your discharge summary.  Do not take your medicine if  develop an itchy rash, swelling in your mouth or lips, or  difficulty breathing.  Call 911/seek emergency medical attention immediately if this occurs.

## 2018-08-17 NOTE — ED Provider Notes (Signed)
[facility] EMERGENCY DEPARTMENT Provider Note   CSN: [id number] Arrival date & time: 08/17/18  1637    History   Chief Complaint Chief Complaint  Patient presents with   Back Pain    HPI Betty Jackson is a 84 y.o. female with history of chronic lower back pain, lumbar fusion, right hip arthroplasty presenting today after fall that occurred 7 days ago.  Patient states that she was in her bathroom and attempted to sit on a chair and missed the chair landing on her behind and rolled onto her lower back.  Patient reports that she had an immediate throbbing aching pain primarily around her right gluteal muscle and right lower back.  Patient was assisted off the floor by her daughter and was ambulatory immediately after the incident.  Patient denies head injury, loss of consciousness, neck pain or extremity injury with her fall.  Patient is on Eliquis.  Patient reports that she has been ambulatory since that time however is having increasing pain over the last few days.  She describes her pain as severe constant without alleviating factors.  Patient has taken 1 Percocet today for her pain.  Patient reports that she normally takes 1 Percocet 3 times daily for chronic back pain.  Denies headache, vision changes, neck pain, chest pain, abdominal pain, nausea/vomiting, shortness of breath, fever/chills, cough or extremity pain.  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  She denies any new or change to her incontinence.  She denies any fecal incontinence, saddle area paresthesias, numbness/weakness or tingling of the extremities, denies urinary retention.    HPI  Past Medical History:  Diagnosis Date   Abdominal pain, epigastric 06/22/2013   Abdominal pain, other specified site 12/24/2011   Abnormal urinalysis 05/20/2017   Acute respiratory failure with hypoxia ([place]) 05/20/2017     Anemia, unspecified 10/28/2012   Anxiety state, unspecified 10/28/2012   Bruises easily    d/t being on prednisone and plavix   CAD (coronary artery disease)    a. Stent RCA in [facility];  b. Cath approx 2009 - nonobs per pt report.   Cataract    immature on the left eye   Chronic insomnia 02/07/2013   Chronic lower back pain    scoliosis   CKD (chronic kidney disease) stage 3, GFR 30-59 ml/min (HCC)    Complication of anesthesia    pt has a very high tolerance to meds   Coronary disease 05/20/2017   CVA (cerebral infarction) 10/29/2012   DDD (degenerative disc disease)    Depression    Diverticulitis    hx of   Diverticulosis    Elevated transaminase level 02/07/2013   Enteritis    GERD (gastroesophageal reflux disease) 09/01/2010   Giant cell arteritis (HCC)    Hemorrhoids    Herniated nucleus pulposus, L5-S1, right 11/04/2015   History of scabies    HTN (hypertension) 05/20/2017   Hyperlipidemia    takes Lipitor daily   Hypertension    takes Amlodipine,Losartan,Metoprolol,and HCTZ daily   Incontinence of urine    Influenza A 05/20/2017   Insomnia    takes Restoril nightly   Lumbar stenosis 04/25/2013   Major depressive disorder, recurrent episode, moderate ([place]) 07/16/2013   Migraines    "back in my 20's; none since" (10/28/2012)   Osteoporosis    PAF (paroxysmal atrial fibrillation) ([place]) 2011   a.  lone epidode in 2011 according to notes.   Rheumatoid arthritis ([place])    Scoliosis    Sepsis ([place]) 05/20/2017   Sinus bradycardia    a. on chronic bb   Temporal arteritis ([place]) 2011   a. followed @ [facility]; potential flareup 10/28/2012/notes 10/28/2012   Vocal cord dysfunction    "they don't operate properly" (10/28/2012)    Patient Active Problem List   Diagnosis Date Noted   Chronic low back pain 07/10/2018   Influenza A 05/20/2017   Sepsis ([place]) 05/20/2017   Acute respiratory failure with hypoxia ([place]) 05/20/2017   CKD (chronic  kidney disease) stage 3, GFR 30-59 ml/min (HCC) 05/20/2017   PAF (paroxysmal atrial fibrillation) ([place]) 05/20/2017   HTN (hypertension) 05/20/2017   Giant cell arteritis ([place]) 05/20/2017   Coronary disease 05/20/2017   Abnormal urinalysis 05/20/2017   Osteoporosis 05/08/2016   Herniated nucleus pulposus, L5-S1, right 11/04/2015   Major depressive disorder, recurrent episode, moderate ([name]) 07/16/2013   Abdominal pain, epigastric 06/22/2013   Lumbar stenosis 04/25/2013   Chronic insomnia 02/07/2013   Elevated transaminase level 02/07/2013   CVA (cerebral infarction) 10/29/2012   Visual changes 10/28/2012   Depression 10/28/2012   Anxiety state, unspecified 10/28/2012   Anemia, unspecified 10/28/2012   Nonspecific (abnormal) findings on radiological and other examination of gastrointestinal tract 01/25/2012   Hyponatremia 01/24/2012   Hematuria 01/24/2012   Enteritis 12/24/2011   Abdominal pain, other specified site 12/24/2011   Leg pain, right 02/18/2011   Temporal arteritis ([place]) 09/01/2010   Hypertension 09/01/2010   Hyperlipidemia 09/01/2010   History of atrial fibrillation 09/01/2010   CAD (coronary artery disease) 09/01/2010   GERD (gastroesophageal reflux disease) 09/01/2010   Insomnia 09/01/2010    Past Surgical History:  Procedure Laterality Date   ABDOMINAL HYSTERECTOMY  ~ 1984   vaginally   BACK SURGERY  7-38yrs ago   X Stop   BLADDER SUSPENSION  2001   BREAST BIOPSY Right    CATARACT EXTRACTION W/ INTRAOCULAR LENS IMPLANT Right ~ 08/2012   COLONOSCOPY  01/26/2012   Procedure: COLONOSCOPY;  Surgeon: Ladene Artist, MD,FACG;  Location: [facility] ENDOSCOPY;  Service: Endoscopy;  Laterality: N/A;  note the EGD is possible   CORONARY ANGIOPLASTY WITH STENT PLACEMENT  2006   X 1 stent   EPIDURAL BLOCK INJECTION     ESOPHAGOGASTRODUODENOSCOPY  01/26/2012   Procedure: ESOPHAGOGASTRODUODENOSCOPY (EGD);  Surgeon: Ladene Artist, MD,FACG;   Location: [facility] ENDOSCOPY;  Service: Endoscopy;  Laterality: N/A;   HEMIARTHROPLASTY HIP Right 2012   LAPAROSCOPIC CHOLECYSTECTOMY  2001   LUMBAR FUSION  03/2013   LUMBAR LAMINECTOMY/DECOMPRESSION MICRODISCECTOMY Right 11/04/2015   Procedure: Right Lumbar Five-Sacral One Microdiskectomy;  Surgeon: Kristeen Miss, MD;  Location: [facility] NEURO ORS;  Service: Neurosurgery;  Laterality: Right;  Right L5-S1 Microdiskectomy   moles removed that required stiches     one on leg and one on face   TEMPORAL ARTERY BIOPSY / LIGATION Bilateral 2011   TONSILLECTOMY AND ADENOIDECTOMY     at age 66   TUBAL LIGATION  ~ [place]  ~ 2010   "lower back" (10/28/2012)     OB History   No obstetric history on file.      Home Medications    Prior to Admission medications   Medication Sig Start Date End Date Taking? Authorizing Provider  calcium carbonate (OS-CAL) 600 MG TABS Take 600 mg by mouth daily.    [provider]  cholecalciferol (VITAMIN D) 1000 UNITS  tablet Take 1,000 Units by mouth daily.     [provider]  ELIQUIS 5 MG TABS tablet TAKE 1 TABLET BY MOUTH TWICE A DAY 07/04/18   Imogene Burn, PA-C  escitalopram (LEXAPRO) 10 MG tablet Take 1 tablet (10 mg total) by mouth daily. 02/18/18   Burchette, Alinda Sierras, MD  furosemide (LASIX) 40 MG tablet Take 1 tablet (40mg ) by mouth twice a day and an extra 1/2 tablet (20mg ) twice a day as needed for edema. 07/14/18   Burnell Blanks, MD  HYDROcodone-acetaminophen (NORCO) 5-325 MG tablet Take 1 tablet by mouth every 8 (eight) hours as needed. 08/05/18   Burchette, Alinda Sierras, MD  HYDROcodone-acetaminophen (NORCO/VICODIN) 5-325 MG tablet Take one tablet by mouth every 8 hours as needed for pain.  May refill in one month. 08/05/18   Burchette, Alinda Sierras, MD  HYDROcodone-acetaminophen (NORCO/VICODIN) 5-325 MG tablet Take one tablet by mouth every 8 hours as needed for pain. May refill in two months. 08/05/18   Burchette, Alinda Sierras,  MD  lidocaine (LIDODERM) 5 % Place 1 patch onto the skin daily. Remove & Discard patch within 12 hours or as directed by MD 08/17/18   Nuala Alpha A, PA-C  losartan (COZAAR) 100 MG tablet TAKE 1 TABLET BY MOUTH EVERY DAY 02/18/18   Burchette, Alinda Sierras, MD  metoprolol tartrate (LOPRESSOR) 50 MG tablet TAKE 1 & 1/2 TABS (75MG ) BY MOUTH 2X DAILY. TAKE 1 EXTRA AS NEEDED FOR RAPID HEART RATE OVER 100. 06/29/18   Imogene Burn, PA-C  potassium chloride SA (K-DUR,KLOR-CON) 20 MEQ tablet Take 1 tablet (20 mEq total) by mouth daily. 06/08/17   Burchette, Alinda Sierras, MD  predniSONE (DELTASONE) 10 MG tablet 4 tablets for 3 days, 3 tablets for 3 days, 2 tablets for 3 days, 1 tablet continue 05/26/17   Reyne Dumas, MD  rosuvastatin (CRESTOR) 10 MG tablet TAKE 1 TABLET BY MOUTH EVERY DAY 06/28/18   Imogene Burn, PA-C  vitamin C (ASCORBIC ACID) 500 MG tablet Take 1,000 mg by mouth daily.     [provider]  vitamin E (VITAMIN E) 400 UNIT capsule Take 400 Units by mouth daily.    [provider]    Family History Family History  Problem Relation Age of Onset   Brain cancer Mother 71   Heart attack Father    Breast cancer Daughter 45   Heart disease Other    Hypertension Other    Arthritis Neg Hx    Colon cancer Neg Hx    Osteoporosis Neg Hx     Social History Social History   Tobacco Use   Smoking status: Never Smoker   Smokeless tobacco: Never Used  Substance Use Topics   Alcohol use: Yes    Comment: socially   Drug use: No     Allergies   Dextromethorphan   Review of Systems Review of Systems  Constitutional: Negative.  Negative for chills and fever.  Eyes: Negative.  Negative for visual disturbance.  Respiratory: Negative.  Negative for cough and shortness of breath.   Cardiovascular: Negative.  Negative for chest pain.  Gastrointestinal: Negative.  Negative for abdominal pain, diarrhea, nausea and vomiting.  Genitourinary: Negative.  Negative for  dysuria and hematuria.  Musculoskeletal: Positive for arthralgias (Right hip) and back pain (Right low back). Negative for neck pain.  Skin: Negative.  Negative for color change and wound.  Neurological: Negative.  Negative for weakness, numbness and headaches.  Denies saddle area paresthesias Denies urinary retention Denies fecal incontinence, denies change to stress incontinence  All other systems reviewed and are negative.  Physical Exam Updated Vital Signs BP (!) 143/67    Pulse 70    Temp 98.6 F (37 C) (Oral)    Resp 18    Ht 5\' 4"  (1.626 m)    Wt 72.6 kg    SpO2 96%    BMI 27.46 kg/m   Physical Exam Constitutional:      General: She is not in acute distress.    Appearance: Normal appearance. She is well-developed. She is not ill-appearing or diaphoretic.  HENT:     Head: Normocephalic and atraumatic. No raccoon eyes or Battle's sign.     Jaw: There is normal jaw occlusion. No trismus.     Right Ear: Tympanic membrane, ear canal and external ear normal. No hemotympanum.     Left Ear: Tympanic membrane, ear canal and external ear normal. No hemotympanum.     Nose: Nose normal.     Mouth/Throat:     Lips: Pink.     Pharynx: Oropharynx is clear.  Eyes:     General: Vision grossly intact. Gaze aligned appropriately.     Extraocular Movements: Extraocular movements intact.     Conjunctiva/sclera: Conjunctivae normal.     Pupils: Pupils are equal, round, and reactive to light.  Neck:     Musculoskeletal: Full passive range of motion without pain, normal range of motion and neck supple. No spinous process tenderness or muscular tenderness.     Trachea: Trachea and phonation normal. No tracheal tenderness or tracheal deviation.  Cardiovascular:     Rate and Rhythm: Normal rate and regular rhythm.     Pulses:          Dorsalis pedis pulses are 2+ on the right side and 2+ on the left side.       Posterior tibial pulses are 2+ on the right side and 2+ on the left side.      Heart sounds: Normal heart sounds.  Pulmonary:     Effort: Pulmonary effort is normal. No accessory muscle usage or respiratory distress.     Breath sounds: Normal breath sounds and air entry.  Chest:     Comments: No sign of injury of the chest Abdominal:     General: There is no distension.     Palpations: Abdomen is soft. There is no pulsatile mass.     Tenderness: There is no abdominal tenderness. There is no guarding or rebound.     Comments: No sign of injury of the abdomen  Musculoskeletal: Normal range of motion.       Back:     Comments: No midline C/T/L spinal tenderness to palpation, mild right-sided lower paraspinal muscular tenderness, no deformity, crepitus, or step-off noted. No sign of injury to the neck or back.  Well-healed lumbar surgical scar present. --- Patient with right gluteal tenderness to palpation.  Well-healed surgical scar overlying right hip.  Patient is able to actively bring her right leg to her chest without difficulty, some increase in pain. - All other major joints brought range of motion without crepitus or pain.   Feet:     Right foot:     Protective Sensation: 5 sites tested. 5 sites sensed.     Left foot:     Protective Sensation: 5 sites tested. 5 sites sensed.  Skin:    General: Skin is warm and dry.  Comments: No bruising seen.  Posterior examination chaperoned by Mathis Fare.  Neurological:     Mental Status: She is alert.     GCS: GCS eye subscore is 4. GCS verbal subscore is 5. GCS motor subscore is 6.     Comments: Speech is clear and goal oriented, follows commands Major Cranial nerves without deficit, no facial droop Normal strength in upper and lower extremities bilaterally including dorsiflexion and plantar flexion, strong and equal grip strength Sensation normal to light and sharp touch Moves extremities without ataxia, coordination intact  Psychiatric:        Behavior: Behavior normal.    ED Treatments / Results   Labs (all labs ordered are listed, but only abnormal results are displayed) Labs Reviewed - No data to display  EKG None  Radiology Dg Lumbar Spine Complete  Result Date: 08/17/2018 CLINICAL DATA:  Fall with pain EXAM: LUMBAR SPINE - COMPLETE 4+ VIEW COMPARISON:  L-spine radiographs dated 02/23/2018. FINDINGS: The patient is status post remote posterior fusion from L2 through L4 with multiple interbody spaces. Advanced degenerative changes are noted of the lumbar spine. The hardware is intact. Aortic calcifications are noted. There is new height loss of the L1 vertebral body. There is sclerosis of the superior endplate. There is increasing height loss of the T11 vertebral body. There is increasing lucency throughout the L2 and L3 vertebral bodies. There appears to be some new endplate sclerosis of the superior endplate of the L2 vertebral body. There is multilevel disc height loss throughout the visualized lumbar spine, greatest at the lower lumbar segments. IMPRESSION: 1. New height loss of the L1 and T11 vertebral bodies concerning for acute compression fractures in the appropriate clinical setting. Correlation with point tenderness is recommended. 2. Status post posterior fusion without evidence of a hardware fracture or failure. 3. Increasing lucency of the L2 and L3 vertebral bodies of unknown clinical significance. This can be further evaluated with a nonemergent outpatient CT. 4. Multilevel degenerative changes. Electronically Signed   By: Constance Holster M.D.   On: 08/17/2018 18:21   Ct Head Wo Contrast  Result Date: 08/17/2018 CLINICAL DATA:  Fall EXAM: CT HEAD WITHOUT CONTRAST TECHNIQUE: Contiguous axial images were obtained from the base of the skull through the vertex without intravenous contrast. COMPARISON:  None. FINDINGS: Brain: No acute territorial infarction, hemorrhage or intracranial mass. Moderate atrophy and small vessel ischemic changes of the white matter. Prominent  ventricles felt secondary to atrophy. Vascular: No hyperdense vessels.  Carotid vascular calcification Skull: Normal. Negative for fracture or focal lesion. Sinuses/Orbits: No acute finding. Other: None IMPRESSION: 1. No CT evidence for acute intracranial abnormality. 2. Atrophy and small vessel ischemic changes of the white matter Electronically Signed   By: Donavan Foil M.D.   On: 08/17/2018 17:53   Dg Hips Bilat W Or Wo Pelvis 3-4 Views  Result Date: 08/17/2018 CLINICAL DATA:  Fall.  Pain. EXAM: DG HIP (WITH OR WITHOUT PELVIS) 3-4V BILAT COMPARISON:  X-ray dated 01/15/2018. FINDINGS: The patient is status post remote total hip arthroplasty on the right. The alignment appears near anatomic. There is no periprosthetic fracture. No dislocation.There are degenerative changes of the left hip. No dislocation or fracture. Vascular calcifications are noted. Diffuse osteopenia is noted. IMPRESSION: No acute osseous abnormality. Electronically Signed   By: Constance Holster M.D.   On: 08/17/2018 18:16    Procedures Procedures (including critical care time)  Medications Ordered in ED Medications  lidocaine (LIDODERM) 5 % 1 patch (1  patch Transdermal Not Given 08/17/18 1911)  oxyCODONE-acetaminophen (PERCOCET/ROXICET) 5-325 MG per tablet 1 tablet (1 tablet Oral Given 08/17/18 1754)     Initial Impression / Assessment and Plan / ED Course  I have reviewed the triage vital signs and the nursing notes.  Pertinent labs & imaging results that were available during my care of the patient were reviewed by me and considered in my medical decision making (see chart for details).    84 year old female with history of chronic back pain and hip pain presents today 7 days after fall.  Mechanical fall without head injury or loss of consciousness.  Assisted on the floor by her daughter and ambulatory since that time.  Patient has had increasing pain over the past 2 days to her right hip.  Physical examination today  reveals right gluteal tenderness to palpation without overlying skin changes.  Range of motion intact however with increased pain of the right hip.  No midline spinal tenderness.  No signs of injury to the head or neck chest or abdomen.  As patient is on Eliquis with fall will obtain CT head to evaluate for bleed.  DG lumbar and pelvis ordered.  Of note patient without red flags concerning for cauda equina or epidural abscess.  No neuro deficits on examination. - CT Head: IMPRESSION: 1. No CT evidence for acute intracranial abnormality. 2. Atrophy and small vessel ischemic changes of the white matter   DG Pelvis: IMPRESSION: No acute osseous abnormality.  DG Lumbar: IMPRESSION: 1. New height loss of the L1 and T11 vertebral bodies concerning for acute compression fractures in the appropriate clinical setting. Correlation with point tenderness is recommended. 2. Status post posterior fusion without evidence of a hardware fracture or failure. 3. Increasing lucency of the L2 and L3 vertebral bodies of unknown clinical significance. This can be further evaluated with a nonemergent outpatient CT. 4. Multilevel degenerative changes.  ---------- As patient is without midline spinal tenderness do not suspect acute compression fracture.  Patient's tenderness is along the right gluteal musculature.  Patient has been informed of imaging findings including vertebral body lucencies of L2 and L3 and that she will need CT follow-up as outpatient scheduled by her primary care provider or surgeon.  Patient seen and evaluated by Dr. Sedonia Small during this visit, as patient has Norco for chronic back pain at home and does not need refill he does not safe to prescribe additional narcotic medication.  Additionally with age and history of CKD cannot provide NSAIDs to this patient.  Patient has been given prescription for Lidoderm to supplement her home Norco, follow-up with PCP and orthopedic advised.  Patient states understanding  and is agreeable to plan of care and discharge at this time.  At this time there does not appear to be any evidence of an acute emergency medical condition and the patient appears stable for discharge with appropriate outpatient follow up. Diagnosis was discussed with patient who verbalizes understanding of care plan and is agreeable to discharge. I have discussed return precautions with patient who verbalizes understanding of return precautions. Patient encouraged to follow-up with their PCP. All questions answered.  Patient has been discharged in good condition.  Patient's case rediscussed with Dr. Sedonia Small who agrees with plan to discharge with follow-up.   Note: Portions of this report may have been transcribed using voice recognition software. Every effort was made to ensure accuracy; however, inadvertent computerized transcription errors may still be present. Final Clinical Impressions(s) / ED Diagnoses   Final  diagnoses:  Right-sided low back pain without sciatica, unspecified chronicity  Right hip pain    ED Discharge Orders         Ordered    lidocaine (LIDODERM) 5 %  Every 24 hours     08/17/18 1900           Gari Crown 08/17/18 1916    Maudie Flakes, MD 08/17/18 2011

## 2018-08-17 NOTE — ED Triage Notes (Signed)
Pt c/o chronic back pain and right hip pain-pain worse x 2 days-states she fell ~1week ago-pain sites worse since fall-pt states she is in pain management for back pain-to triage in w/c-NAD

## 2018-08-19 ENCOUNTER — Other Ambulatory Visit: Payer: Self-pay | Admitting: Cardiovascular Disease

## 2018-08-19 ENCOUNTER — Other Ambulatory Visit (HOSPITAL_BASED_OUTPATIENT_CLINIC_OR_DEPARTMENT_OTHER): Payer: Self-pay | Admitting: Neurological Surgery

## 2018-08-19 ENCOUNTER — Ambulatory Visit (HOSPITAL_BASED_OUTPATIENT_CLINIC_OR_DEPARTMENT_OTHER)
Admission: RE | Admit: 2018-08-19 | Discharge: 2018-08-19 | Disposition: A | Discharge status: Home / Self Care | Payer: Medicare Other | Source: Ambulatory Visit | Attending: Neurological Surgery | Admitting: Neurological Surgery

## 2018-08-19 ENCOUNTER — Other Ambulatory Visit: Payer: Self-pay

## 2018-08-19 DIAGNOSIS — M47816 Spondylosis without myelopathy or radiculopathy, lumbar region: Secondary | ICD-10-CM

## 2018-08-19 DIAGNOSIS — S32018A Other fracture of first lumbar vertebra, initial encounter for closed fracture: Secondary | ICD-10-CM | POA: Diagnosis not present

## 2018-08-24 ENCOUNTER — Other Ambulatory Visit: Payer: Self-pay | Admitting: Family Medicine

## 2018-08-26 DIAGNOSIS — M4856XA Collapsed vertebra, not elsewhere classified, lumbar region, initial encounter for fracture: Secondary | ICD-10-CM | POA: Diagnosis not present

## 2018-08-30 ENCOUNTER — Telehealth: Payer: Self-pay | Admitting: Family Medicine

## 2018-08-30 NOTE — Telephone Encounter (Signed)
Copied from [place] [id number]. Topic: Quick Communication - Rx Refill/Question >> Aug 30, 2018 12:11 PM Andria Frames L wrote: Medication: HYDROcodone-acetaminophen pt calling to see if she can get additional medication. Pt states that she feel in the shower and broke some bones in her back. Pt has been evaluated in the ED. Pt would like a call back from the nurse regarding. Please advise

## 2018-08-30 NOTE — Telephone Encounter (Signed)
Please see message. °

## 2018-08-30 NOTE — Telephone Encounter (Signed)
Called patient and gave her the message from Dr. Elease Hashimoto and she stated that she has an appointment with Dr. Marin Olp on Thursday and will ask for a pain management referral and let us know who he recommends if they are not able to send in a referral. Patient verbalized an understanding.

## 2018-08-30 NOTE — Telephone Encounter (Signed)
Because of her age and other co-morbidities, I do not feel comfortable escalating her dose.  What we can offer is refer to pain management, if she is interested (she has declined in past).

## 2018-09-01 DIAGNOSIS — M4856XA Collapsed vertebra, not elsewhere classified, lumbar region, initial encounter for fracture: Secondary | ICD-10-CM | POA: Diagnosis not present

## 2018-09-01 DIAGNOSIS — S22080A Wedge compression fracture of T11-T12 vertebra, initial encounter for closed fracture: Secondary | ICD-10-CM | POA: Diagnosis not present

## 2018-09-01 DIAGNOSIS — S32010S Wedge compression fracture of first lumbar vertebra, sequela: Secondary | ICD-10-CM | POA: Diagnosis not present

## 2018-09-01 DIAGNOSIS — S32010A Wedge compression fracture of first lumbar vertebra, initial encounter for closed fracture: Secondary | ICD-10-CM | POA: Diagnosis not present

## 2018-09-01 DIAGNOSIS — S22080S Wedge compression fracture of T11-T12 vertebra, sequela: Secondary | ICD-10-CM | POA: Diagnosis not present

## 2018-09-15 ENCOUNTER — Telehealth: Payer: Self-pay

## 2018-09-15 NOTE — Telephone Encounter (Signed)
Copied from [place] [phone]. Topic: General - Other >> Sep 15, 2018 10:11 AM Leward Quan A wrote: Reason for CRM: Patient called to say that she had back surgery two weeks ago and that she have ran out of the pain medication that was given by the surgeon ran out on 09/14/2018 because she have been taking pills every 6 hrs as prescribed by the surgeon. She is asking if Dr Elease Hashimoto can please inform the pharmacy that it is ok to get a refill on his order for HYDROcodone-acetaminophen ([place]) 5-325 MG tablet she can not wait until July 8 for the refill. Please call patient at Ph# [phone]

## 2018-09-16 DIAGNOSIS — M4856XA Collapsed vertebra, not elsewhere classified, lumbar region, initial encounter for fracture: Secondary | ICD-10-CM | POA: Diagnosis not present

## 2018-09-16 NOTE — Telephone Encounter (Signed)
Please see message. °

## 2018-09-18 NOTE — Telephone Encounter (Signed)
Please run controlled drug report so we can confirm what has been prescribed.

## 2018-09-19 NOTE — Telephone Encounter (Signed)
Report placed on your desk

## 2018-09-19 NOTE — Telephone Encounter (Signed)
Noted.  She got #120 hydrocodone from surgeon on Friday.

## 2018-09-23 ENCOUNTER — Other Ambulatory Visit: Payer: Self-pay | Admitting: Physician Assistant

## 2018-10-20 ENCOUNTER — Other Ambulatory Visit (INDEPENDENT_AMBULATORY_CARE_PROVIDER_SITE_OTHER): Payer: Medicare Other

## 2018-10-20 ENCOUNTER — Other Ambulatory Visit: Payer: Self-pay

## 2018-10-20 DIAGNOSIS — Z79899 Other long term (current) drug therapy: Secondary | ICD-10-CM

## 2018-10-20 DIAGNOSIS — M316 Other giant cell arteritis: Secondary | ICD-10-CM

## 2018-10-20 LAB — HIGH SENSITIVITY CRP: CRP, High Sensitivity: 0.75 mg/L (ref 0.000–5.000)

## 2018-10-20 LAB — SEDIMENTATION RATE: Sed Rate: 16 mm/hr (ref 0–30)

## 2018-10-20 NOTE — Progress Notes (Signed)
Standing orders for high sensitivity CRP and sedimentation rate were collected today.

## 2018-10-25 DIAGNOSIS — D485 Neoplasm of uncertain behavior of skin: Secondary | ICD-10-CM | POA: Diagnosis not present

## 2018-10-25 DIAGNOSIS — L820 Inflamed seborrheic keratosis: Secondary | ICD-10-CM | POA: Diagnosis not present

## 2018-10-25 DIAGNOSIS — B079 Viral wart, unspecified: Secondary | ICD-10-CM | POA: Diagnosis not present

## 2018-11-14 ENCOUNTER — Encounter: Payer: Self-pay | Admitting: Family Medicine

## 2018-11-14 ENCOUNTER — Ambulatory Visit (INDEPENDENT_AMBULATORY_CARE_PROVIDER_SITE_OTHER): Payer: Medicare Other | Admitting: Family Medicine

## 2018-11-14 ENCOUNTER — Other Ambulatory Visit: Payer: Self-pay

## 2018-11-14 VITALS — BP 138/78 | HR 56 | Temp 98.1°F | Ht 64.0 in | Wt 162.7 lb

## 2018-11-14 DIAGNOSIS — I10 Essential (primary) hypertension: Secondary | ICD-10-CM

## 2018-11-14 DIAGNOSIS — I2510 Atherosclerotic heart disease of native coronary artery without angina pectoris: Secondary | ICD-10-CM

## 2018-11-14 DIAGNOSIS — E782 Mixed hyperlipidemia: Secondary | ICD-10-CM | POA: Diagnosis not present

## 2018-11-14 DIAGNOSIS — N183 Chronic kidney disease, stage 3 unspecified: Secondary | ICD-10-CM

## 2018-11-14 DIAGNOSIS — R5383 Other fatigue: Secondary | ICD-10-CM | POA: Diagnosis not present

## 2018-11-14 LAB — CBC WITH DIFFERENTIAL/PLATELET
Basophils Absolute: 0.1 10*3/uL (ref 0.0–0.1)
Basophils Relative: 0.4 % (ref 0.0–3.0)
Eosinophils Absolute: 0 10*3/uL (ref 0.0–0.7)
Eosinophils Relative: 0.2 % (ref 0.0–5.0)
HCT: 38.6 % (ref 36.0–46.0)
Hemoglobin: 12.6 g/dL (ref 12.0–15.0)
Lymphocytes Relative: 13.8 % (ref 12.0–46.0)
Lymphs Abs: 1.8 10*3/uL (ref 0.7–4.0)
MCHC: 32.6 g/dL (ref 30.0–36.0)
MCV: 90.3 fl (ref 78.0–100.0)
Monocytes Absolute: 0.8 10*3/uL (ref 0.1–1.0)
Monocytes Relative: 6.3 % (ref 3.0–12.0)
Neutro Abs: 10.6 10*3/uL — ABNORMAL HIGH (ref 1.4–7.7)
Neutrophils Relative %: 79.3 % — ABNORMAL HIGH (ref 43.0–77.0)
Platelets: 247 10*3/uL (ref 150.0–400.0)
RBC: 4.27 Mil/uL (ref 3.87–5.11)
RDW: 14.8 % (ref 11.5–15.5)
WBC: 13.3 10*3/uL — ABNORMAL HIGH (ref 4.0–10.5)

## 2018-11-14 LAB — COMPREHENSIVE METABOLIC PANEL
ALT: 24 U/L (ref 0–35)
AST: 20 U/L (ref 0–37)
Albumin: 4 g/dL (ref 3.5–5.2)
Alkaline Phosphatase: 85 U/L (ref 39–117)
BUN: 16 mg/dL (ref 6–23)
CO2: 31 mEq/L (ref 19–32)
Calcium: 9.3 mg/dL (ref 8.4–10.5)
Chloride: 102 mEq/L (ref 96–112)
Creatinine, Ser: 1 mg/dL (ref 0.40–1.20)
GFR: 53.03 mL/min — ABNORMAL LOW (ref >60.00)
Glucose, Bld: 90 mg/dL (ref 70–99)
Potassium: 3.9 mEq/L (ref 3.5–5.1)
Sodium: 142 mEq/L (ref 135–145)
Total Bilirubin: 0.7 mg/dL (ref 0.2–1.2)
Total Protein: 6.3 g/dL (ref 6.0–8.3)

## 2018-11-14 LAB — TSH: TSH: 1.43 u[IU]/mL (ref 0.35–4.50)

## 2018-11-14 LAB — LIPID PANEL
Cholesterol: 171 mg/dL (ref 0–200)
HDL: 63.4 mg/dL (ref >39.00)
LDL Cholesterol: 74 mg/dL (ref 0–99)
NonHDL: 108.01
Total CHOL/HDL Ratio: 3
Triglycerides: 168 mg/dL — ABNORMAL HIGH (ref 0.0–149.0)
VLDL: 33.6 mg/dL (ref 0.0–40.0)

## 2018-11-14 LAB — VITAMIN B12: Vitamin B-12: 405 pg/mL (ref 211–911)

## 2018-11-14 NOTE — Progress Notes (Signed)
Subjective:     Patient ID: Betty Jackson, female   DOB: [birth year], 84 y.o.   MRN: [id number]  HPI Patient is here accompanied by her daughter for medical follow-up.  She has history of CAD, atrial fibrillation, hypertension, temporal arteritis, GERD, history of CVA, osteoporosis, chronic kidney disease stage III, chronic back pain.  She has been evaluated back pain especially the past few years.  She has had previous surgeries had some chronic pain and is getting ready to see physical medicine rehab specialist with her neurosurgeon.  She has recently taken some hydrocodone twice daily and that is providing some relief.  The major complaint is that she has had some general fatigue issues.  They realize some of this may be due to less activity.  She has become progressively less active over the past year.  She has not had any recent labs in terms of thyroid functions or any lipids.  She gets regular C-reactive protein and sed rate because of her temporal arteritis which is followed by rheumatology.  Denies any recent chest pains, fever, cough, dyspnea at rest.  Past Medical History:  Diagnosis Date  . Abdominal pain, epigastric 06/22/2013  . Abdominal pain, other specified site 12/24/2011  . Abnormal urinalysis 05/20/2017  . Acute respiratory failure with hypoxia ([place]) 05/20/2017  . Anemia, unspecified 10/28/2012  . Anxiety state, unspecified 10/28/2012  . Bruises easily    d/t being on prednisone and plavix  . CAD (coronary artery disease)    a. Stent RCA in [facility];  b. Cath approx 2009 - nonobs per pt report.  . Cataract    immature on the left eye  . Chronic insomnia 02/07/2013  . Chronic lower back pain    scoliosis  . CKD (chronic kidney disease) stage 3, GFR 30-59 ml/min (HCC)   . Complication of anesthesia    pt has a very high tolerance to meds  . Coronary disease 05/20/2017  . CVA (cerebral infarction) 10/29/2012  . DDD (degenerative disc disease)   . Depression   .  Diverticulitis    hx of  . Diverticulosis   . Elevated transaminase level 02/07/2013  . Enteritis   . GERD (gastroesophageal reflux disease) 09/01/2010  . Giant cell arteritis ([place])   . Hemorrhoids   . Herniated nucleus pulposus, L5-S1, right 11/04/2015  . History of scabies   . HTN (hypertension) 05/20/2017  . Hyperlipidemia    takes Lipitor daily  . Hypertension    takes Amlodipine,Losartan,Metoprolol,and HCTZ daily  . Incontinence of urine   . Influenza A 05/20/2017  . Insomnia    takes Restoril nightly  . Lumbar stenosis 04/25/2013  . Major depressive disorder, recurrent episode, moderate ([place]) 07/16/2013  . Migraines    "back in my 20's; none since" (10/28/2012)  . Osteoporosis   . PAF (paroxysmal atrial fibrillation) ([place]) 2011   a. lone epidode in 2011 according to notes.  . Rheumatoid arthritis ([place])   . Scoliosis   . Sepsis ([place]) 05/20/2017  . Sinus bradycardia    a. on chronic bb  . Temporal arteritis ([place]) 2011   a. followed @ [facility]; potential flareup 10/28/2012/notes 10/28/2012  . Vocal cord dysfunction    "they don't operate properly" (10/28/2012)   Past Surgical History:  Procedure Laterality Date  . ABDOMINAL HYSTERECTOMY  ~ 1984   vaginally  . BACK SURGERY  7-26yrs ago   X Stop  . BLADDER SUSPENSION  2001  . BREAST BIOPSY Right   . CATARACT  EXTRACTION W/ INTRAOCULAR LENS IMPLANT Right ~ 08/2012  . COLONOSCOPY  01/26/2012   Procedure: COLONOSCOPY;  Surgeon: Ladene Artist, MD,FACG;  Location: [facility] ENDOSCOPY;  Service: Endoscopy;  Laterality: N/A;  note the EGD is possible  . CORONARY ANGIOPLASTY WITH STENT PLACEMENT  2006   X 1 stent  . EPIDURAL BLOCK INJECTION    . ESOPHAGOGASTRODUODENOSCOPY  01/26/2012   Procedure: ESOPHAGOGASTRODUODENOSCOPY (EGD);  Surgeon: Ladene Artist, MD,FACG;  Location: [facility] ENDOSCOPY;  Service: Endoscopy;  Laterality: N/A;  . HEMIARTHROPLASTY HIP Right 2012  . LAPAROSCOPIC CHOLECYSTECTOMY  2001  . LUMBAR FUSION  03/2013  . LUMBAR  LAMINECTOMY/DECOMPRESSION MICRODISCECTOMY Right 11/04/2015   Procedure: Right Lumbar Five-Sacral One Microdiskectomy;  Surgeon: Kristeen Miss, MD;  Location: [facility] NEURO ORS;  Service: Neurosurgery;  Laterality: Right;  Right L5-S1 Microdiskectomy  . moles removed that required stiches     one on leg and one on face  . TEMPORAL ARTERY BIOPSY / LIGATION Bilateral 2011  . TONSILLECTOMY AND ADENOIDECTOMY     at age 75  . TUBAL LIGATION  ~ 1982  . X-STOP IMPLANTATION  ~ 2010   "lower back" (10/28/2012)    reports that she has never smoked. She has never used smokeless tobacco. She reports current alcohol use. She reports that she does not use drugs. family history includes Brain cancer (age of onset: 88) in her mother; Breast cancer (age of onset: 69) in her daughter; Heart attack in her father; Heart disease in an other family member; Hypertension in an other family member. Allergies  Allergen Reactions  . Dextromethorphan Rash     Review of Systems  Constitutional: Positive for fatigue. Negative for chills and fever.  HENT: Negative for trouble swallowing.   Respiratory: Negative for cough and shortness of breath.   Cardiovascular: Negative for chest pain.  Gastrointestinal: Negative for abdominal pain.  Genitourinary: Negative for dysuria.  Neurological: Positive for weakness.       Objective:   Physical Exam Constitutional:      Appearance: She is well-developed.  Eyes:     Pupils: Pupils are equal, round, and reactive to light.  Neck:     Musculoskeletal: Neck supple.     Thyroid: No thyromegaly.     Vascular: No JVD.  Cardiovascular:     Rate and Rhythm: Normal rate and regular rhythm.     Heart sounds: No gallop.   Pulmonary:     Effort: Pulmonary effort is normal. No respiratory distress.     Breath sounds: Normal breath sounds. No wheezing or rales.  Musculoskeletal:     Right lower leg: No edema.     Left lower leg: No edema.  Neurological:     General: No focal  deficit present.     Mental Status: She is alert and oriented to person, place, and time.  Psychiatric:        Mood and Affect: Mood normal.        Behavior: Behavior normal.        Assessment:     #1 General fatigue-question multifactorial.  She has been less active over the past year and question is whether this reflects decreased activity levels versus something else.  #2 history of CAD and hyperlipidemia.  Due for follow-up lipids  #3 hypertension stable  #4 chronic back pain    Plan:     -Check further labs with lipids, CBC, CMP, B12, TSH -They plan to discuss her chronic back pain management further with physical medicine and  rehab specialist later this week and neurosurgical office. -We explained that if we were to provide her further pain meds would need to get controlled med contract signed along with drug screening at least once yearly  Eulas Post MD [facility] at [facility]

## 2018-11-15 ENCOUNTER — Other Ambulatory Visit: Payer: Self-pay | Admitting: Physician Assistant

## 2018-11-15 DIAGNOSIS — M545 Low back pain: Secondary | ICD-10-CM | POA: Diagnosis not present

## 2018-11-15 DIAGNOSIS — M629 Disorder of muscle, unspecified: Secondary | ICD-10-CM | POA: Diagnosis not present

## 2018-11-15 NOTE — Telephone Encounter (Signed)
Pt last saw Dr Angelena Form on 01/06/18, last labs 11/14/18 Creat 1.0, age 84, weight 73.8kg, based on specified criteria pt is on appropriate dosage of Eliquis 5mg  BID.  Will refill rx.

## 2018-11-16 ENCOUNTER — Other Ambulatory Visit: Payer: Self-pay | Admitting: Family Medicine

## 2018-11-16 NOTE — Telephone Encounter (Signed)
We went through this once before.  Not sure where this request if coming from- I presume the pharmacy.  We do not have on list so would not refill.   Wonder if we need to contact pharmacy.

## 2018-11-16 NOTE — Telephone Encounter (Signed)
Not on current med list, OK to fill??

## 2018-11-19 DIAGNOSIS — I10 Essential (primary) hypertension: Secondary | ICD-10-CM | POA: Diagnosis not present

## 2018-11-19 DIAGNOSIS — M629 Disorder of muscle, unspecified: Secondary | ICD-10-CM | POA: Diagnosis not present

## 2018-11-19 DIAGNOSIS — S32019D Unspecified fracture of first lumbar vertebra, subsequent encounter for fracture with routine healing: Secondary | ICD-10-CM | POA: Diagnosis not present

## 2018-11-19 DIAGNOSIS — S22089D Unspecified fracture of T11-T12 vertebra, subsequent encounter for fracture with routine healing: Secondary | ICD-10-CM | POA: Diagnosis not present

## 2018-11-19 DIAGNOSIS — G8929 Other chronic pain: Secondary | ICD-10-CM | POA: Diagnosis not present

## 2018-11-21 DIAGNOSIS — G8929 Other chronic pain: Secondary | ICD-10-CM | POA: Diagnosis not present

## 2018-11-21 DIAGNOSIS — S22089D Unspecified fracture of T11-T12 vertebra, subsequent encounter for fracture with routine healing: Secondary | ICD-10-CM | POA: Diagnosis not present

## 2018-11-21 DIAGNOSIS — M629 Disorder of muscle, unspecified: Secondary | ICD-10-CM | POA: Diagnosis not present

## 2018-11-21 DIAGNOSIS — I10 Essential (primary) hypertension: Secondary | ICD-10-CM | POA: Diagnosis not present

## 2018-11-21 DIAGNOSIS — S32019D Unspecified fracture of first lumbar vertebra, subsequent encounter for fracture with routine healing: Secondary | ICD-10-CM | POA: Diagnosis not present

## 2018-11-25 DIAGNOSIS — G8929 Other chronic pain: Secondary | ICD-10-CM | POA: Diagnosis not present

## 2018-11-25 DIAGNOSIS — S22089D Unspecified fracture of T11-T12 vertebra, subsequent encounter for fracture with routine healing: Secondary | ICD-10-CM | POA: Diagnosis not present

## 2018-11-25 DIAGNOSIS — S32019D Unspecified fracture of first lumbar vertebra, subsequent encounter for fracture with routine healing: Secondary | ICD-10-CM | POA: Diagnosis not present

## 2018-11-25 DIAGNOSIS — I10 Essential (primary) hypertension: Secondary | ICD-10-CM | POA: Diagnosis not present

## 2018-11-25 DIAGNOSIS — M629 Disorder of muscle, unspecified: Secondary | ICD-10-CM | POA: Diagnosis not present

## 2018-11-29 DIAGNOSIS — S22089D Unspecified fracture of T11-T12 vertebra, subsequent encounter for fracture with routine healing: Secondary | ICD-10-CM | POA: Diagnosis not present

## 2018-11-29 DIAGNOSIS — M629 Disorder of muscle, unspecified: Secondary | ICD-10-CM | POA: Diagnosis not present

## 2018-11-29 DIAGNOSIS — S32019D Unspecified fracture of first lumbar vertebra, subsequent encounter for fracture with routine healing: Secondary | ICD-10-CM | POA: Diagnosis not present

## 2018-11-29 DIAGNOSIS — G8929 Other chronic pain: Secondary | ICD-10-CM | POA: Diagnosis not present

## 2018-11-29 DIAGNOSIS — I10 Essential (primary) hypertension: Secondary | ICD-10-CM | POA: Diagnosis not present

## 2018-12-02 DIAGNOSIS — S22089D Unspecified fracture of T11-T12 vertebra, subsequent encounter for fracture with routine healing: Secondary | ICD-10-CM | POA: Diagnosis not present

## 2018-12-02 DIAGNOSIS — G8929 Other chronic pain: Secondary | ICD-10-CM | POA: Diagnosis not present

## 2018-12-02 DIAGNOSIS — S32019D Unspecified fracture of first lumbar vertebra, subsequent encounter for fracture with routine healing: Secondary | ICD-10-CM | POA: Diagnosis not present

## 2018-12-02 DIAGNOSIS — I10 Essential (primary) hypertension: Secondary | ICD-10-CM | POA: Diagnosis not present

## 2018-12-02 DIAGNOSIS — M629 Disorder of muscle, unspecified: Secondary | ICD-10-CM | POA: Diagnosis not present

## 2018-12-06 DIAGNOSIS — S22089D Unspecified fracture of T11-T12 vertebra, subsequent encounter for fracture with routine healing: Secondary | ICD-10-CM | POA: Diagnosis not present

## 2018-12-06 DIAGNOSIS — I10 Essential (primary) hypertension: Secondary | ICD-10-CM | POA: Diagnosis not present

## 2018-12-06 DIAGNOSIS — M629 Disorder of muscle, unspecified: Secondary | ICD-10-CM | POA: Diagnosis not present

## 2018-12-06 DIAGNOSIS — G8929 Other chronic pain: Secondary | ICD-10-CM | POA: Diagnosis not present

## 2018-12-06 DIAGNOSIS — S32019D Unspecified fracture of first lumbar vertebra, subsequent encounter for fracture with routine healing: Secondary | ICD-10-CM | POA: Diagnosis not present

## 2018-12-09 DIAGNOSIS — S32019D Unspecified fracture of first lumbar vertebra, subsequent encounter for fracture with routine healing: Secondary | ICD-10-CM | POA: Diagnosis not present

## 2018-12-09 DIAGNOSIS — M629 Disorder of muscle, unspecified: Secondary | ICD-10-CM | POA: Diagnosis not present

## 2018-12-09 DIAGNOSIS — S22089D Unspecified fracture of T11-T12 vertebra, subsequent encounter for fracture with routine healing: Secondary | ICD-10-CM | POA: Diagnosis not present

## 2018-12-09 DIAGNOSIS — I10 Essential (primary) hypertension: Secondary | ICD-10-CM | POA: Diagnosis not present

## 2018-12-09 DIAGNOSIS — G8929 Other chronic pain: Secondary | ICD-10-CM | POA: Diagnosis not present

## 2018-12-10 ENCOUNTER — Other Ambulatory Visit: Payer: Self-pay | Admitting: Family Medicine

## 2018-12-13 ENCOUNTER — Ambulatory Visit: Payer: Self-pay

## 2018-12-13 NOTE — Telephone Encounter (Signed)
Please call and schedule for held slots on Betty Jackson's schedule. If this time does not work please coordinate another day/time.

## 2018-12-13 NOTE — Telephone Encounter (Signed)
Incoming call from Patient and daughter. Patient complains of feeling Dizzy .  Patient states that when she roll over in bed she becomes dizzy.   Mild during the day and moderate toe severe during the night.  Onset began a week ago. Aggravating factors are rolling over in the bed. Pulse is 80 and O2 Sat is 98.  Dosent know Whats making her Dizzy.  Denies any other Sx. Pt. Would like to make an appointment.  Please call Pt. In the morning to schedule an appointment with Dr. Lolita Lenz.        Reason for Disposition . [1] MODERATE dizziness (e.g., interferes with normal activities) AND [2] has NOT been evaluated by physician for this  (Exception: dizziness caused by heat exposure, sudden standing, or poor fluid intake)  Answer Assessment - Initial Assessment Questions 1. DESCRIPTION: "Describe your dizziness."     Rolled on right sight  In bed 2. LIGHTHEADED: "Do you feel lightheaded?" (e.g., somewhat faint, woozy, weak upon standing)     denies 3. VERTIGO: "Do you feel like either you or the room is spinning or tilting?" (i.e. vertigo)     *No Answer* 4. SEVERITY: "How bad is it?"  "Do you feel like you are going to faint?" "Can you stand and walk?"   - MILD - walking normally   - MODERATE - interferes with normal activities (e.g., work, school)    - SEVERE - unable to stand, requires support to walk, feels like passing out now.      Mild during day  Moderate at night.  5. ONSET:  "When did the dizziness begin?"      A week ago 6. AGGRAVATING FACTORS: "Does anything make it worse?" (e.g., standing, change in head position)      Roll over in bed 7. HEART RATE: "Can you tell me your heart rate?" "How many beats in 15 seconds?"  (Note: not all patients can do this)       8o o2 sat 98 8. CAUSE: "What do you think is causing the dizziness?"     I dont know 9. RECURRENT SYMPTOM: "Have you had dizziness before?" If so, ask: "When was the last time?" "What happened that time?"     no 10. OTHER  SYMPTOMS: "Do you have any other symptoms?" (e.g., fever, chest pain, vomiting, diarrhea, bleeding)       Denies 11. PREGNANCY: "Is there any chance you are pregnant?" "When was your last menstrual period?"       Na  Protocols used: DIZZINESS [facility]

## 2018-12-14 ENCOUNTER — Other Ambulatory Visit: Payer: Self-pay

## 2018-12-14 ENCOUNTER — Encounter: Payer: Self-pay | Admitting: Family Medicine

## 2018-12-14 ENCOUNTER — Ambulatory Visit (INDEPENDENT_AMBULATORY_CARE_PROVIDER_SITE_OTHER): Payer: Medicare Other | Admitting: Family Medicine

## 2018-12-14 VITALS — BP 136/74 | HR 66 | Temp 97.6°F | Ht 64.0 in | Wt 163.8 lb

## 2018-12-14 DIAGNOSIS — I10 Essential (primary) hypertension: Secondary | ICD-10-CM

## 2018-12-14 DIAGNOSIS — H8111 Benign paroxysmal vertigo, right ear: Secondary | ICD-10-CM

## 2018-12-14 DIAGNOSIS — S32019D Unspecified fracture of first lumbar vertebra, subsequent encounter for fracture with routine healing: Secondary | ICD-10-CM | POA: Diagnosis not present

## 2018-12-14 DIAGNOSIS — Z23 Encounter for immunization: Secondary | ICD-10-CM

## 2018-12-14 DIAGNOSIS — S22089D Unspecified fracture of T11-T12 vertebra, subsequent encounter for fracture with routine healing: Secondary | ICD-10-CM | POA: Diagnosis not present

## 2018-12-14 DIAGNOSIS — G8929 Other chronic pain: Secondary | ICD-10-CM | POA: Diagnosis not present

## 2018-12-14 DIAGNOSIS — M629 Disorder of muscle, unspecified: Secondary | ICD-10-CM | POA: Diagnosis not present

## 2018-12-14 NOTE — Patient Instructions (Signed)
Benign Positional Vertigo Vertigo is the feeling that you or your surroundings are moving when they are not. Benign positional vertigo is the most common form of vertigo. This is usually a harmless condition (benign). This condition is positional. This means that symptoms are triggered by certain movements and positions. This condition can be dangerous if it occurs while you are doing something that could cause harm to you or others. This includes activities such as driving or operating machinery. What are the causes? In many cases, the cause of this condition is not known. It may be caused by a disturbance in an area of the inner ear that helps your brain to sense movement and balance. This disturbance can be caused by:  Viral infection (labyrinthitis).  Head injury.  Repetitive motion, such as jumping, dancing, or running. What increases the risk? You are more likely to develop this condition if:  You are a woman.  You are 50 years of age or older. What are the signs or symptoms? Symptoms of this condition usually happen when you move your head or your eyes in different directions. Symptoms may start suddenly, and usually last for less than a minute. They include:  Loss of balance and falling.  Feeling like you are spinning or moving.  Feeling like your surroundings are spinning or moving.  Nausea and vomiting.  Blurred vision.  Dizziness.  Involuntary eye movement (nystagmus). Symptoms can be mild and cause only minor problems, or they can be severe and interfere with daily life. Episodes of benign positional vertigo may return (recur) over time. Symptoms may improve over time. How is this diagnosed? This condition may be diagnosed based on:  Your medical history.  Physical exam of the head, neck, and ears.  Tests, such as: ? MRI. ? CT scan. ? Eye movement tests. Your health care provider may ask you to change positions quickly while he or she watches you for symptoms  of benign positional vertigo, such as nystagmus. Eye movement may be tested with a variety of exams that are designed to evaluate or stimulate vertigo. ? An electroencephalogram (EEG). This records electrical activity in your brain. ? Hearing tests. You may be referred to a health care provider who specializes in ear, nose, and throat (ENT) problems (otolaryngologist) or a provider who specializes in disorders of the nervous system (neurologist). How is this treated?  This condition may be treated in a session in which your health care provider moves your head in specific positions to adjust your inner ear back to normal. Treatment for this condition may take several sessions. Surgery may be needed in severe cases, but this is rare. In some cases, benign positional vertigo may resolve on its own in 2-4 weeks. Follow these instructions at home: Safety  Move slowly. Avoid sudden body or head movements or certain positions, as told by your health care provider.  Avoid driving until your health care provider says it is safe for you to do so.  Avoid operating heavy machinery until your health care provider says it is safe for you to do so.  Avoid doing any tasks that would be dangerous to you or others if vertigo occurs.  If you have trouble walking or keeping your balance, try using a cane for stability. If you feel dizzy or unstable, sit down right away.  Return to your normal activities as told by your health care provider. Ask your health care provider what activities are safe for you. General instructions  Take over-the-counter   and prescription medicines only as told by your health care provider.  Drink enough fluid to keep your urine pale yellow.  Keep all follow-up visits as told by your health care provider. This is important. Contact a health care provider if:  You have a fever.  Your condition gets worse or you develop new symptoms.  Your family or friends notice any  behavioral changes.  You have nausea or vomiting that gets worse.  You have numbness or a "pins and needles" sensation. Get help right away if you:  Have difficulty speaking or moving.  Are always dizzy.  Faint.  Develop severe headaches.  Have weakness in your legs or arms.  Have changes in your hearing or vision.  Develop a stiff neck.  Develop sensitivity to light. Summary  Vertigo is the feeling that you or your surroundings are moving when they are not. Benign positional vertigo is the most common form of vertigo.  The cause of this condition is not known. It may be caused by a disturbance in an area of the inner ear that helps your brain to sense movement and balance.  Symptoms include loss of balance and falling, feeling that you or your surroundings are moving, nausea and vomiting, and blurred vision.  This condition can be diagnosed based on symptoms, physical exam, and other tests, such as MRI, CT scan, eye movement tests, and hearing tests.  Follow safety instructions as told by your health care provider. You will also be told when to contact your health care provider in case of problems. This information is not intended to replace advice given to you by your health care provider. Make sure you discuss any questions you have with your health care provider. Document Released: 12/22/2005 Document Revised: 08/25/2017 Document Reviewed: 08/25/2017 Elsevier Patient Education  2020 Elsevier Inc.  

## 2018-12-14 NOTE — Progress Notes (Signed)
Subjective:     Patient ID: Betty Jackson, female   DOB: 1935-03-04, 84 y.o.   MRN: [id number]  HPI Patient has multiple chronic problems include history of CAD, hypertension, atrial fibrillation, chronic anticoagulation therapy, temporal arteritis, GERD, remote history of CVA, chronic kidney disease, and chronic back and neck pain with severe osteoarthritis.  She is seen today with new problem of dizziness.  This started last week.  She recalls rolling over in bed to the right one morning and had extreme vertigo symptoms which were somewhat transient.  Symptoms improved through the day.  Her symptoms have gradually improved since that time.  Denied any associated nausea or vomiting.  No speech changes.  No swallowing difficulty.  No focal weakness.  No ataxia.  No headache.  No visual changes.  Denies prior history of vertigo.  Appetite and weight have been stable.  Her major complaint is ongoing back difficulties.  She is now taking low-dose hydrocodone which she does feel helps.  She had multiple recent labs in August which were all stable.  Past Medical History:  Diagnosis Date  . Abdominal pain, epigastric 06/22/2013  . Abdominal pain, other specified site 12/24/2011  . Abnormal urinalysis 05/20/2017  . Acute respiratory failure with hypoxia ([place]) 05/20/2017  . Anemia, unspecified 10/28/2012  . Anxiety state, unspecified 10/28/2012  . Bruises easily    d/t being on prednisone and plavix  . CAD (coronary artery disease)    a. St[facility] RCA in [facility];  b. Cath approx 2009 - nonobs per pt report.  . Cataract    immature on the left eye  . Chronic insomnia 02/07/2013  . Chronic lower back pain    scoliosis  . CKD (chronic kidney disease) stage 3, GFR 30-59 ml/min (HCC)   . Complication of anesthesia    pt has a very high tolerance to meds  . Coronary disease 05/20/2017  . CVA (cerebral infarction) 10/29/2012  . DDD (degenerative disc disease)   . Depression   . Diverticulitis    hx  of  . Diverticulosis   . Elevated transaminase level 02/07/2013  . Enteritis   . GERD (gastroesophageal reflux disease) 09/01/2010  . Giant cell arteritis ([place])   . Hemorrhoids   . Herniated nucleus pulposus, L5-S1, right 11/04/2015  . History of scabies   . HTN (hypertension) 05/20/2017  . Hyperlipidemia    takes Lipitor daily  . Hypertension    takes Amlodipine,Losartan,Metoprolol,and HCTZ daily  . Incontinence of urine   . Influenza A 05/20/2017  . Insomnia    takes Restoril nightly  . Lumbar stenosis 04/25/2013  . Major depressive disorder, recurrent episode, moderate ([place]) 07/16/2013  . Migraines    "back in my 20's; none since" (10/28/2012)  . Osteoporosis   . PAF (paroxysmal atrial fibrillation) ([place]) 2011   a. lone epidode in 2011 according to notes.  . Rheumatoid arthritis ([place])   . Scoliosis   . Sepsis ([place]) 05/20/2017  . Sinus bradycardia    a. on chronic bb  . Temporal arteritis ([place]) 2011   a. followed @ [facility]; potential flareup 10/28/2012/notes 10/28/2012  . Vocal cord dysfunction    "they don't operate properly" (10/28/2012)   Past Surgical History:  Procedure Laterality Date  . ABDOMINAL HYSTERECTOMY  ~ 1984   vaginally  . BACK SURGERY  7-94yrs ago   X Stop  . BLADDER SUSPENSION  2001  . BREAST BIOPSY Right   . CATARACT EXTRACTION W/ INTRAOCULAR LENS IMPLANT Right ~  08/2012  . COLONOSCOPY  01/26/2012   Procedure: COLONOSCOPY;  Surgeon: Ladene Artist, MD,FACG;  Location[facility] ENDOSCOPY;  Service: Endoscopy;  Laterality: N/A;  note the EGD is possible  . CORONARY ANGIOPLASTY WITH STENT PLACEMENT  2006   X 1 stent  . EPIDURAL BLOCK INJECTION    . ESOPHAGOGASTRODUODENOSCOPY  01/26/2012   Procedure: ESOPHAGOGASTRODUODENOSCOPY (EGD);  Surgeon: Ladene Artist, MD,FACG;  Location: [facility] ENDOSCOPY;  Service: Endoscopy;  Laterality: N/A;  . HEMIARTHROPLASTY HIP Right 2012  . LAPAROSCOPIC CHOLECYSTECTOMY  2001  . LUMBAR FUSION  03/2013  . LUMBAR LAMINECTOMY/DECOMPRESSION  MICRODISCECTOMY Right 11/04/2015   Procedure: Right Lumbar Five-Sacral One Microdiskectomy;  Surgeon: Kristeen Miss, MD;  Location: [place] NEURO ORS;  Service: Neurosurgery;  Laterality: Right;  Right L5-S1 Microdiskectomy  . moles removed that required stiches     one on leg and one on face  . TEMPORAL ARTERY BIOPSY / LIGATION Bilateral 2011  . TONSILLECTOMY AND ADENOIDECTOMY     at age 33  . TUBAL LIGATION  ~ 1982  . X-STOP IMPLANTATION  ~ 2010   "lower back" (10/28/2012)    reports that she has never smoked. She has never used smokeless tobacco. She reports current alcohol use. She reports that she does not use drugs. family history includes Brain cancer (age of onset: 79) in her mother; Breast cancer (age of onset: 67) in her daughter; Heart attack in her father; Heart disease in an other family member; Hypertension in an other family member. Allergies  Allergen Reactions  . Dextromethorphan Rash     Review of Systems  Constitutional: Negative for appetite change, chills, fever and unexpected weight change.  Respiratory: Negative for cough and shortness of breath.   Cardiovascular: Negative for chest pain.  Gastrointestinal: Negative for abdominal pain.  Genitourinary: Negative for dysuria.  Neurological: Positive for dizziness. Negative for seizures, syncope, speech difficulty and headaches.  Psychiatric/Behavioral: Negative for confusion.       Objective:   Physical Exam Vitals signs reviewed.  Constitutional:      Appearance: Normal appearance.  HENT:     Head: Normocephalic.     Right Ear: Tympanic membrane normal.     Left Ear: Tympanic membrane normal.  Cardiovascular:     Rate and Rhythm: Normal rate.  Pulmonary:     Effort: Pulmonary effort is normal.     Breath sounds: Normal breath sounds.  Musculoskeletal:     Right lower leg: No edema.     Left lower leg: No edema.  Neurological:     General: No focal deficit present.     Mental Status: She is alert and  oriented to person, place, and time.     Cranial Nerves: No cranial nerve deficit.     Comments: She has some very subtle horizontal nystagmus.  No vertical nystagmus No focal strength deficits.  No ataxia. We attempted to reproduce her symptoms but because of her severe arthritis in the neck and back unable to do this efficiently        Assessment:     #1 recent onset vertigo to the right.  Suspect benign peripheral positional vertigo.  No worrisome neurologic findings at this time.  Her symptoms are improving.  #2 hypertension which is stable    Plan:     -We discussed Epley maneuvers but these may not be feasible with her severe neck and back difficulties.  We did give handout and went over these with patient and daughter -Fortunately, her symptoms seem  to be already improving.  We discussed red flags for more worrisome vertigo -Flu vaccine given -Follow-up for any persistent or worsening symptoms or any new symptoms  Eulas Post MD [facility] at [facility]

## 2018-12-16 DIAGNOSIS — S32019D Unspecified fracture of first lumbar vertebra, subsequent encounter for fracture with routine healing: Secondary | ICD-10-CM | POA: Diagnosis not present

## 2018-12-16 DIAGNOSIS — G8929 Other chronic pain: Secondary | ICD-10-CM | POA: Diagnosis not present

## 2018-12-16 DIAGNOSIS — M629 Disorder of muscle, unspecified: Secondary | ICD-10-CM | POA: Diagnosis not present

## 2018-12-16 DIAGNOSIS — S22089D Unspecified fracture of T11-T12 vertebra, subsequent encounter for fracture with routine healing: Secondary | ICD-10-CM | POA: Diagnosis not present

## 2018-12-16 DIAGNOSIS — I10 Essential (primary) hypertension: Secondary | ICD-10-CM | POA: Diagnosis not present

## 2019-01-05 ENCOUNTER — Telehealth: Payer: Self-pay | Admitting: *Deleted

## 2019-01-05 NOTE — Telephone Encounter (Signed)
   Primary Cardiologist: [name], MD  Chart reviewed as part of pre-operative protocol coverage. Simple dental extractions, up to two teeth, are considered low risk procedures per guidelines and generally do not require any specific cardiac clearance. It is also generally accepted that for simple extractions and dental cleanings, there is no need to interrupt blood thinner therapy.   SBE prophylaxis is not required for the patient.  I will route this recommendation to the requesting party via Epic fax function and remove from pre-op pool.  Please call with questions.  Kathyrn Drown, NP 01/05/2019, 10:57 AM

## 2019-01-05 NOTE — Telephone Encounter (Signed)
   [facility] Pre-operative Risk Assessment    Request for surgical clearance:  1. What type of surgery is being performed? 2 TEETH TO BE EXTRACTED   2. When is this surgery scheduled? TBD   3. What type of clearance is required (medical clearance vs. Pharmacy clearance to hold med vs. Both)? BOTH  4. Are there any medications that need to be held prior to surgery and how long? ELIQUIS   5. Practice name and name of physician performing surgery? Margarette Canada, DDS   6. What is your office phone number [phone]   7.   What is your office fax number [phone]   8.   Anesthesia type (None, local, MAC, general) ? NONE LISTED   Julaine Hua 01/05/2019, 10:32 AM  _________________________________________________________________   (provider comments below)

## 2019-01-11 ENCOUNTER — Telehealth: Payer: Medicare Other | Admitting: Family Medicine

## 2019-01-11 ENCOUNTER — Telehealth (INDEPENDENT_AMBULATORY_CARE_PROVIDER_SITE_OTHER): Payer: Medicare Other | Admitting: Family Medicine

## 2019-01-11 ENCOUNTER — Other Ambulatory Visit: Payer: Self-pay

## 2019-01-11 DIAGNOSIS — F5104 Psychophysiologic insomnia: Secondary | ICD-10-CM | POA: Diagnosis not present

## 2019-01-11 DIAGNOSIS — R1013 Epigastric pain: Secondary | ICD-10-CM

## 2019-01-11 MED ORDER — PANTOPRAZOLE SODIUM 40 MG PO TBEC: 40.0000 mg | DELAYED_RELEASE_TABLET | Freq: Every day | ORAL | 3 refills | Status: DC

## 2019-01-11 NOTE — Progress Notes (Signed)
This visit type was conducted due to national recommendations for restrictions regarding the COVID-19 pandemic in an effort to limit this patient's exposure and mitigate transmission in our community.   Virtual Visit via Telephone Note  I connected with Betty Jackson on 01/11/19 at  3:00 PM EDT by telephone and verified that I am speaking with the correct person using two identifiers.   I discussed the limitations, risks, security and privacy concerns of performing an evaluation and management service by telephone and the availability of in person appointments. I also discussed with the patient that there may be a patient responsible charge related to this service. The patient expressed understanding and agreed to proceed.  Location patient: home Location provider: work or home office Participants present for the call: patient, provider Patient did not have a visit in the prior 7 days to address this/these issue(s).   History of Present Illness: Betty Jackson called to discuss the following issues  She is having some epigastric pain for past 2 to 3 days.  She had one episode of vomiting couple nights ago but none since.  No coffee-ground emesis.  No melena.  She has had occasional loose stools.  Pain is relatively constant.  No significant radiation.  No history of pancreatitis.  No clear exacerbating or alleviating factors.  She has had previous cholecystectomy.  She is on Eliquis and low-dose prednisone for temporal arteritis.  Denies any abdominal distension.  Other issue is chronic insomnia.  She has difficulty getting to sleep and staying asleep.  She in the past had taken Restoril but we were reluctant to keep her on this because of her opioid use.  She has tried things like trazodone without success as well as melatonin and Benadryl   Observations/Objective: Patient sounds cheerful and well on the phone. I do not appreciate any SOB. Speech and thought processing are grossly intact. Patient  reported vitals:  Assessment and Plan:  #1 Epigastric abdominal pain.  She is not describing red flags such as hematemesis, melena, dizziness, etc but concerned with her age, chronic prednisone use, etc  -Trial of Protonix 40 mg daily and touch base in a few weeks for follow-up.  We have advised her to go immediately to the ER if she develops any melena, hematemesis, dizziness, or worsening pain  #2 Insomnia.-Chronic  -Sleep hygiene discussed. -Avoid benzodiazepines with her chronic opioid use  Follow Up Instructions:  - as above.    [id number] [id number] [id number] 11-20 [id number] 21-30 I did not refer this patient for an OV in the next 24 hours for this/these issue(s).  I discussed the assessment and treatment plan with the patient. The patient was provided an opportunity to ask questions and all were answered. The patient agreed with the plan and demonstrated an understanding of the instructions.   The patient was advised to call back or seek an in-person evaluation if the symptoms worsen or if the condition fails to improve as anticipated.  I provided 25 minutes of non-face-to-face time during this encounter.   Carolann Littler, MD

## 2019-01-12 ENCOUNTER — Telehealth: Payer: Self-pay | Admitting: Family Medicine

## 2019-01-12 ENCOUNTER — Emergency Department (HOSPITAL_BASED_OUTPATIENT_CLINIC_OR_DEPARTMENT_OTHER)
Admission: EM | Admit: 2019-01-12 | Discharge: 2019-01-12 | Disposition: A | Discharge status: Home / Self Care | Payer: Medicare Other | Source: Home / Self Care | Attending: Emergency Medicine | Admitting: Emergency Medicine

## 2019-01-12 ENCOUNTER — Other Ambulatory Visit: Payer: Self-pay

## 2019-01-12 ENCOUNTER — Encounter (HOSPITAL_BASED_OUTPATIENT_CLINIC_OR_DEPARTMENT_OTHER): Payer: Self-pay | Admitting: Emergency Medicine

## 2019-01-12 ENCOUNTER — Emergency Department (HOSPITAL_BASED_OUTPATIENT_CLINIC_OR_DEPARTMENT_OTHER): Payer: Medicare Other

## 2019-01-12 DIAGNOSIS — I129 Hypertensive chronic kidney disease with stage 1 through stage 4 chronic kidney disease, or unspecified chronic kidney disease: Secondary | ICD-10-CM | POA: Insufficient documentation

## 2019-01-12 DIAGNOSIS — I5032 Chronic diastolic (congestive) heart failure: Secondary | ICD-10-CM | POA: Diagnosis not present

## 2019-01-12 DIAGNOSIS — I2510 Atherosclerotic heart disease of native coronary artery without angina pectoris: Secondary | ICD-10-CM | POA: Insufficient documentation

## 2019-01-12 DIAGNOSIS — M419 Scoliosis, unspecified: Secondary | ICD-10-CM | POA: Diagnosis not present

## 2019-01-12 DIAGNOSIS — E785 Hyperlipidemia, unspecified: Secondary | ICD-10-CM | POA: Diagnosis not present

## 2019-01-12 DIAGNOSIS — R112 Nausea with vomiting, unspecified: Secondary | ICD-10-CM | POA: Diagnosis not present

## 2019-01-12 DIAGNOSIS — R6511 Systemic inflammatory response syndrome (SIRS) of non-infectious origin with acute organ dysfunction: Secondary | ICD-10-CM | POA: Diagnosis not present

## 2019-01-12 DIAGNOSIS — K5792 Diverticulitis of intestine, part unspecified, without perforation or abscess without bleeding: Secondary | ICD-10-CM | POA: Diagnosis not present

## 2019-01-12 DIAGNOSIS — Z7901 Long term (current) use of anticoagulants: Secondary | ICD-10-CM | POA: Insufficient documentation

## 2019-01-12 DIAGNOSIS — N183 Chronic kidney disease, stage 3 unspecified: Secondary | ICD-10-CM | POA: Diagnosis not present

## 2019-01-12 DIAGNOSIS — K449 Diaphragmatic hernia without obstruction or gangrene: Secondary | ICD-10-CM | POA: Diagnosis not present

## 2019-01-12 DIAGNOSIS — R1030 Lower abdominal pain, unspecified: Secondary | ICD-10-CM | POA: Diagnosis not present

## 2019-01-12 DIAGNOSIS — R197 Diarrhea, unspecified: Secondary | ICD-10-CM | POA: Diagnosis not present

## 2019-01-12 DIAGNOSIS — M810 Age-related osteoporosis without current pathological fracture: Secondary | ICD-10-CM | POA: Diagnosis not present

## 2019-01-12 DIAGNOSIS — Z7952 Long term (current) use of systemic steroids: Secondary | ICD-10-CM | POA: Diagnosis not present

## 2019-01-12 DIAGNOSIS — K5732 Diverticulitis of large intestine without perforation or abscess without bleeding: Secondary | ICD-10-CM | POA: Diagnosis not present

## 2019-01-12 DIAGNOSIS — I130 Hypertensive heart and chronic kidney disease with heart failure and stage 1 through stage 4 chronic kidney disease, or unspecified chronic kidney disease: Secondary | ICD-10-CM | POA: Diagnosis not present

## 2019-01-12 DIAGNOSIS — Z955 Presence of coronary angioplasty implant and graft: Secondary | ICD-10-CM | POA: Diagnosis not present

## 2019-01-12 DIAGNOSIS — J383 Other diseases of vocal cords: Secondary | ICD-10-CM | POA: Diagnosis not present

## 2019-01-12 DIAGNOSIS — Z808 Family history of malignant neoplasm of other organs or systems: Secondary | ICD-10-CM | POA: Diagnosis not present

## 2019-01-12 DIAGNOSIS — Z803 Family history of malignant neoplasm of breast: Secondary | ICD-10-CM | POA: Diagnosis not present

## 2019-01-12 DIAGNOSIS — N179 Acute kidney failure, unspecified: Secondary | ICD-10-CM | POA: Diagnosis not present

## 2019-01-12 DIAGNOSIS — Z8249 Family history of ischemic heart disease and other diseases of the circulatory system: Secondary | ICD-10-CM | POA: Diagnosis not present

## 2019-01-12 DIAGNOSIS — Z66 Do not resuscitate: Secondary | ICD-10-CM | POA: Diagnosis not present

## 2019-01-12 DIAGNOSIS — K219 Gastro-esophageal reflux disease without esophagitis: Secondary | ICD-10-CM | POA: Diagnosis not present

## 2019-01-12 DIAGNOSIS — I480 Paroxysmal atrial fibrillation: Secondary | ICD-10-CM | POA: Diagnosis not present

## 2019-01-12 DIAGNOSIS — Z79899 Other long term (current) drug therapy: Secondary | ICD-10-CM | POA: Insufficient documentation

## 2019-01-12 DIAGNOSIS — M069 Rheumatoid arthritis, unspecified: Secondary | ICD-10-CM | POA: Diagnosis not present

## 2019-01-12 DIAGNOSIS — I10 Essential (primary) hypertension: Secondary | ICD-10-CM | POA: Diagnosis not present

## 2019-01-12 DIAGNOSIS — G8929 Other chronic pain: Secondary | ICD-10-CM | POA: Diagnosis not present

## 2019-01-12 LAB — CBC WITH DIFFERENTIAL/PLATELET
Abs Immature Granulocytes: 0.05 10*3/uL (ref 0.00–0.07)
Basophils Absolute: 0 10*3/uL (ref 0.0–0.1)
Basophils Relative: 0 %
Eosinophils Absolute: 0 10*3/uL (ref 0.0–0.5)
Eosinophils Relative: 0 %
HCT: 38.5 % (ref 36.0–46.0)
Hemoglobin: 12.3 g/dL (ref 12.0–15.0)
Immature Granulocytes: 0 %
Lymphocytes Relative: 18 %
Lymphs Abs: 2.1 10*3/uL (ref 0.7–4.0)
MCH: 30.2 pg (ref 26.0–34.0)
MCHC: 31.9 g/dL (ref 30.0–36.0)
MCV: 94.6 fL (ref 80.0–100.0)
Monocytes Absolute: 1.2 10*3/uL — ABNORMAL HIGH (ref 0.1–1.0)
Monocytes Relative: 10 %
Neutro Abs: 8.5 10*3/uL — ABNORMAL HIGH (ref 1.7–7.7)
Neutrophils Relative %: 72 %
Platelets: 175 10*3/uL (ref 150–400)
RBC: 4.07 MIL/uL (ref 3.87–5.11)
RDW: 13.7 % (ref 11.5–15.5)
WBC: 11.9 10*3/uL — ABNORMAL HIGH (ref 4.0–10.5)
nRBC: 0 % (ref 0.0–0.2)

## 2019-01-12 LAB — COMPREHENSIVE METABOLIC PANEL
ALT: 14 U/L (ref 0–44)
AST: 16 U/L (ref 15–41)
Albumin: 3.7 g/dL (ref 3.5–5.0)
Alkaline Phosphatase: 77 U/L (ref 38–126)
Anion gap: 13 (ref 5–15)
BUN: 11 mg/dL (ref 8–23)
CO2: 29 mmol/L (ref 22–32)
Calcium: 8.9 mg/dL (ref 8.9–10.3)
Chloride: 97 mmol/L — ABNORMAL LOW (ref 98–111)
Creatinine, Ser: 0.73 mg/dL (ref 0.44–1.00)
GFR calc Af Amer: >60 mL/min (ref >60)
GFR calc non Af Amer: >60 mL/min (ref >60)
Glucose, Bld: 102 mg/dL — ABNORMAL HIGH (ref 70–99)
Potassium: 3 mmol/L — ABNORMAL LOW (ref 3.5–5.1)
Sodium: 139 mmol/L (ref 135–145)
Total Bilirubin: 2 mg/dL — ABNORMAL HIGH (ref 0.3–1.2)
Total Protein: 6.8 g/dL (ref 6.5–8.1)

## 2019-01-12 LAB — LIPASE, BLOOD: Lipase: 30 U/L (ref 11–51)

## 2019-01-12 MED ORDER — SODIUM CHLORIDE 0.9 % IV BOLUS
1000.0000 mL | Freq: Once | INTRAVENOUS | Status: AC
  Administered 2019-01-12: 1000 mL via INTRAVENOUS

## 2019-01-12 MED ORDER — IOHEXOL 300 MG/ML  SOLN
100.0000 mL | Freq: Once | INTRAMUSCULAR | Status: AC | PRN
  Administered 2019-01-12: 100 mL via INTRAVENOUS

## 2019-01-12 MED ORDER — OXYCODONE HCL 5 MG PO TABS: 5.0000 mg | ORAL_TABLET | ORAL | 0 refills | Status: DC | PRN

## 2019-01-12 MED ORDER — ONDANSETRON HCL 4 MG/2ML IJ SOLN
4.0000 mg | Freq: Once | INTRAMUSCULAR | Status: AC
  Administered 2019-01-12: 4 mg via INTRAVENOUS
  Filled 2019-01-12: qty 2

## 2019-01-12 MED ORDER — CIPROFLOXACIN HCL 500 MG PO TABS: 500.0000 mg | ORAL_TABLET | Freq: Two times a day (BID) | ORAL | 0 refills | Status: DC

## 2019-01-12 MED ORDER — ONDANSETRON HCL 4 MG/2ML IJ SOLN
4.0000 mg | Freq: Once | INTRAMUSCULAR | Status: AC
  Administered 2019-01-12: 12:00:00 4 mg via INTRAVENOUS
  Filled 2019-01-12: qty 2

## 2019-01-12 MED ORDER — METRONIDAZOLE 500 MG PO TABS: 500.0000 mg | ORAL_TABLET | Freq: Three times a day (TID) | ORAL | 0 refills | Status: DC

## 2019-01-12 MED ORDER — MORPHINE SULFATE (PF) 4 MG/ML IV SOLN
4.0000 mg | Freq: Once | INTRAVENOUS | Status: AC
  Administered 2019-01-12: 4 mg via INTRAVENOUS
  Filled 2019-01-12: qty 1

## 2019-01-12 MED ORDER — DICYCLOMINE HCL 20 MG PO TABS: 20.0000 mg | ORAL_TABLET | Freq: Two times a day (BID) | ORAL | 0 refills | Status: DC | PRN

## 2019-01-12 MED ORDER — ONDANSETRON 4 MG PO TBDP: 4.0000 mg | ORAL_TABLET | Freq: Three times a day (TID) | ORAL | 0 refills | Status: DC | PRN

## 2019-01-12 MED ORDER — POTASSIUM CHLORIDE CRYS ER 20 MEQ PO TBCR
40.0000 meq | EXTENDED_RELEASE_TABLET | Freq: Once | ORAL | Status: AC
  Administered 2019-01-12: 40 meq via ORAL
  Filled 2019-01-12: qty 2

## 2019-01-12 MED FILL — ONDANSETRON ODT 4 MG TABLET: 4 | 6 days supply | Qty: 20 | Fill #0

## 2019-01-12 MED FILL — CIPROFLOXACIN HCL 500 MG TA: 500 | 14 days supply | Qty: 28 | Fill #0

## 2019-01-12 MED FILL — DICYCLOMINE 20 MG TABLET: 20 | 10 days supply | Qty: 20 | Fill #0

## 2019-01-12 MED FILL — METRONIDAZOLE 500 MG TABS: 500 | 14 days supply | Qty: 42 | Fill #0

## 2019-01-12 MED FILL — oxyCODONE HCL 5 MG TABS: 5 | 2 days supply | Qty: 12 | Fill #0

## 2019-01-12 NOTE — Telephone Encounter (Signed)
Caller name: Vogelsinger,Melissa Relation to pt: daughter Call back number: [phone]   Reason for call:  Patient unable to keep anything down experiencing abdominal and back pain, daughter will be taking patient to [facility] and wanted to make PCP aware

## 2019-01-12 NOTE — Telephone Encounter (Signed)
Please advise as Dr. Elease Hashimoto is not in office today.

## 2019-01-12 NOTE — ED Notes (Signed)
ED Provider at bedside. 

## 2019-01-12 NOTE — ED Notes (Signed)
Sprite given to pt

## 2019-01-12 NOTE — ED Triage Notes (Signed)
Upper abdominal pain with vomiting and diarrhea x3 days. Also back pain since she has not been able to take her pain med since night before last.

## 2019-01-12 NOTE — ED Provider Notes (Signed)
[facility] EMERGENCY DEPARTMENT Provider Note   CSN: [id number] Arrival date & time: 01/12/19  1043     History   Chief Complaint Chief Complaint  Patient presents with   Abdominal Pain    HPI Betty Jackson is a 84 y.o. female.     HPI   84 year old female with history of coronary artery disease, CKD, diverticulitis, hypertension, depression, giant cell arteritis on steroids, paroxysmal atrial fibrillation, rheumatoid arthritis, vocal cord dysfunction, hyperlipidemia, chronic low back pain for which she is seeing pain management, presents with concern for abdominal pain, nausea, vomiting, diarrhea and worsening back pain.   Reports that her symptoms started initially as epigastric abdominal pain, but none now she notes lower abdominal pain as well. She had a televisit with her PCP and was trialing PPI for symptoms without relief. Reports is been present for approximately 3 days.  She has had associated nausea and vomiting over the last 3 days.  She has been unable to keep down much, but has been unable to take her chronic pain medication.  She now notes worsening lumbar back pain, which worsens with movement.  No new numbness, weakness, loss of control of bowel or bladder.  No known fevers at home.  Wrote she also has diarrhea.  Her appetite has been decreased somewhat, but is unclear if it has been decreased mostly secondary to nausea.  Has chronic cough due to vocal cord dysfunction without any acute changes.  No known sick contacts. No urinary symptoms.   Past Medical History:  Diagnosis Date   Abdominal pain, epigastric 06/22/2013   Abdominal pain, other specified site 12/24/2011   Abnormal urinalysis 05/20/2017   Acute respiratory failure with hypoxia ([place]) 05/20/2017   Anemia, unspecified 10/28/2012   Anxiety state, unspecified 10/28/2012   Bruises easily    d/t being on prednisone and plavix   CAD (coronary artery disease)    a. Stent RCA in [facility];  b. Cath approx 2009 - nonobs per pt report.   Cataract    immature on the left eye   Chronic insomnia 02/07/2013   Chronic lower back pain    scoliosis   CKD (chronic kidney disease) stage 3, GFR 0000000 ml/min    Complication of anesthesia    pt has a very high tolerance to meds   Coronary disease 05/20/2017   CVA (cerebral infarction) 10/29/2012   DDD (degenerative disc disease)    Depression    Diverticulitis    hx of   Diverticulosis    Elevated transaminase level 02/07/2013   Enteritis    GERD (gastroesophageal reflux disease) 09/01/2010   Giant cell arteritis (HCC)    Hemorrhoids    Herniated nucleus pulposus, L5-S1, right 11/04/2015   History of scabies    HTN (hypertension) 05/20/2017   Hyperlipidemia    takes Lipitor daily   Hypertension    takes Amlodipine,Losartan,Metoprolol,and HCTZ daily   Incontinence of urine    Influenza A 05/20/2017   Insomnia    takes Restoril nightly   Lumbar stenosis 04/25/2013   Major depressive disorder, recurrent episode, moderate ([place]) 07/16/2013   Migraines    "back in my 20's; none since" (10/28/2012)   Osteoporosis    PAF (paroxysmal atrial fibrillation) ([place]) 2011   a. lone epidode in 2011 according to notes.   Rheumatoid arthritis ([place])    Scoliosis    Sepsis ([place]) 05/20/2017   Sinus bradycardia    a. on chronic bb   Temporal arteritis (  [place]) 2011   a. followed @ [facility]; potential flareup 10/28/2012/notes 10/28/2012   Vocal cord dysfunction    "they don't operate properly" (10/28/2012)    Patient Active Problem List   Diagnosis Date Noted   Chronic low back pain 07/10/2018   Influenza A 05/20/2017   Sepsis ([place]) 05/20/2017   Acute respiratory failure with hypoxia ([place]) 05/20/2017   CKD (chronic kidney disease) stage 3, GFR 30-59 ml/min 05/20/2017   PAF (paroxysmal atrial fibrillation) ([place]) 05/20/2017   HTN (hypertension) 05/20/2017   Giant cell arteritis ([place]) 05/20/2017   Coronary  disease 05/20/2017   Abnormal urinalysis 05/20/2017   Osteoporosis 05/08/2016   Herniated nucleus pulposus, L5-S1, right 11/04/2015   Major depressive disorder, recurrent episode, moderate ([place]) 07/16/2013   Abdominal pain, epigastric 06/22/2013   Lumbar stenosis 04/25/2013   Chronic insomnia 02/07/2013   Elevated transaminase level 02/07/2013   CVA (cerebral infarction) 10/29/2012   Visual changes 10/28/2012   Depression 10/28/2012   Anxiety state, unspecified 10/28/2012   Anemia, unspecified 10/28/2012   Nonspecific (abnormal) findings on radiological and other examination of gastrointestinal tract 01/25/2012   Hyponatremia 01/24/2012   Hematuria 01/24/2012   Enteritis 12/24/2011   Abdominal pain, other specified site 12/24/2011   Leg pain, right 02/18/2011   Temporal arteritis ([place]) 09/01/2010   Hypertension 09/01/2010   Hyperlipidemia 09/01/2010   History of atrial fibrillation 09/01/2010   CAD (coronary artery disease) 09/01/2010   GERD (gastroesophageal reflux disease) 09/01/2010   Insomnia 09/01/2010    Past Surgical History:  Procedure Laterality Date   ABDOMINAL HYSTERECTOMY  ~ 1984   vaginally   BACK SURGERY  7-42yrs ago   X Stop   BLADDER SUSPENSION  2001   BREAST BIOPSY Right    CATARACT EXTRACTION W/ INTRAOCULAR LENS IMPLANT Right ~ 08/2012   COLONOSCOPY  01/26/2012   Procedure: COLONOSCOPY;  Surgeon: Ladene Artist, MD,FACG;  Location: [facility] ENDOSCOPY;  Service: Endoscopy;  Laterality: N/A;  note the EGD is possible   CORONARY ANGIOPLASTY WITH STENT PLACEMENT  2006   X 1 stent   EPIDURAL BLOCK INJECTION     ESOPHAGOGASTRODUODENOSCOPY  01/26/2012   Procedure: ESOPHAGOGASTRODUODENOSCOPY (EGD);  Surgeon: Ladene Artist, MD,FACG;  Location: [facility] ENDOSCOPY;  Service: Endoscopy;  Laterality: N/A;   HEMIARTHROPLASTY HIP Right 2012   LAPAROSCOPIC CHOLECYSTECTOMY  2001   LUMBAR FUSION  03/2013   LUMBAR LAMINECTOMY/DECOMPRESSION  MICRODISCECTOMY Right 11/04/2015   Procedure: Right Lumbar Five-Sacral One Microdiskectomy;  Surgeon: Kristeen Miss, MD;  Location: [facility] NEURO ORS;  Service: Neurosurgery;  Laterality: Right;  Right L5-S1 Microdiskectomy   moles removed that required stiches     one on leg and one on face   TEMPORAL ARTERY BIOPSY / LIGATION Bilateral 2011   TONSILLECTOMY AND ADENOIDECTOMY     at age 42   TUBAL LIGATION  ~ [place]  ~ 2010   "lower back" (10/28/2012)     OB History   No obstetric history on file.      Home Medications    Prior to Admission medications   Medication Sig Start Date End Date Taking? Authorizing Provider  calcium carbonate (OS-CAL) 600 MG TABS Take 600 mg by mouth daily.    [provider]  cholecalciferol (VITAMIN D) 1000 UNITS tablet Take 1,000 Units by mouth daily.     [provider]  ciprofloxacin (CIPRO) 500 MG tablet Take 1 tablet (500 mg total) by mouth every 12 (twelve) hours for 14 days. 01/12/19 01/26/19  Gareth Morgan, MD  dicyclomine (BENTYL) 20 MG tablet Take 1 tablet (20 mg total) by mouth 2 (two) times daily as needed for spasms. 01/12/19   Gareth Morgan, MD  ELIQUIS 5 MG TABS tablet TAKE 1 TABLET BY MOUTH TWICE A DAY 11/15/18   Burnell Blanks, MD  escitalopram (LEXAPRO) 10 MG tablet TAKE 1 TABLET BY MOUTH EVERY DAY 12/14/18   Burchette, Alinda Sierras, MD  furosemide (LASIX) 40 MG tablet Take 1 tablet (40mg ) by mouth twice a day and an extra 1/2 tablet (20mg ) twice a day as needed for edema. 07/14/18   Burnell Blanks, MD  HYDROcodone-acetaminophen (NORCO/VICODIN) 5-325 MG tablet Take one tablet by mouth every 8 hours as needed for pain. May refill in two months. 08/05/18   Burchette, Alinda Sierras, MD  lidocaine (LIDODERM) 5 % Place 1 patch onto the skin daily. Remove & Discard patch within 12 hours or as directed by MD 08/17/18   Nuala Alpha A, PA-C  losartan (COZAAR) 100 MG tablet TAKE 1 TABLET BY MOUTH EVERY DAY  12/14/18   Burchette, Alinda Sierras, MD  metoprolol tartrate (LOPRESSOR) 50 MG tablet TAKE 1 AND 1/2 TABLET BY MOUTH TWICE A DAY. TAKE 1 EXTRA TABLET AS NEEDED FOR RAPID HEART RATE, OVER 100 BPM. 09/23/18   Imogene Burn, PA-C  metroNIDAZOLE (FLAGYL) 500 MG tablet Take 1 tablet (500 mg total) by mouth 3 (three) times daily for 14 days. 01/12/19 01/26/19  Gareth Morgan, MD  ondansetron (ZOFRAN ODT) 4 MG disintegrating tablet Take 1 tablet (4 mg total) by mouth every 8 (eight) hours as needed for nausea or vomiting. 01/12/19   Gareth Morgan, MD  oxyCODONE (ROXICODONE) 5 MG immediate release tablet Take 1 tablet (5 mg total) by mouth every 4 (four) hours as needed for severe pain. 01/12/19   Gareth Morgan, MD  pantoprazole (PROTONIX) 40 MG tablet Take 1 tablet (40 mg total) by mouth daily. 01/11/19   Burchette, Alinda Sierras, MD  potassium chloride SA (K-DUR,KLOR-CON) 20 MEQ tablet Take 1 tablet (20 mEq total) by mouth daily. 06/08/17   Burchette, Alinda Sierras, MD  predniSONE (DELTASONE) 10 MG tablet 4 tablets for 3 days, 3 tablets for 3 days, 2 tablets for 3 days, 1 tablet continue 05/26/17   Reyne Dumas, MD  rosuvastatin (CRESTOR) 10 MG tablet TAKE 1 TABLET BY MOUTH EVERY DAY 06/28/18   Imogene Burn, PA-C  vitamin C (ASCORBIC ACID) 500 MG tablet Take 1,000 mg by mouth daily.     [provider]  vitamin E (VITAMIN E) 400 UNIT capsule Take 400 Units by mouth daily.    [provider]    Family History Family History  Problem Relation Age of Onset   Brain cancer Mother 58   Heart attack Father    Breast cancer Daughter 76   Heart disease Other    Hypertension Other    Arthritis Neg Hx    Colon cancer Neg Hx    Osteoporosis Neg Hx     Social History Social History   Tobacco Use   Smoking status: Never Smoker   Smokeless tobacco: Never Used  Substance Use Topics   Alcohol use: Yes    Comment: socially   Drug use: No     Allergies    Dextromethorphan   Review of Systems Review of Systems  Constitutional: Negative for fever.  HENT: Negative for sore throat.   Eyes: Negative for visual disturbance.  Respiratory: Negative for cough and shortness of breath.  Cardiovascular: Negative for chest pain.  Gastrointestinal: Positive for abdominal pain, diarrhea, nausea and vomiting.  Genitourinary: Negative for difficulty urinating and dysuria.  Musculoskeletal: Positive for back pain. Negative for neck pain.  Skin: Negative for rash.  Neurological: Negative for syncope and headaches.     Physical Exam Updated Vital Signs BP (!) 151/64 (BP Location: Right Arm)    Pulse 81    Temp 99.6 F (37.6 C) (Oral)    Resp 18    Ht 5' 4.5" (1.638 m)    Wt 72.6 kg    SpO2 95%    BMI 27.04 kg/m   Physical Exam Vitals signs and nursing note reviewed.  Constitutional:      General: She is not in acute distress.    Appearance: She is well-developed. She is not diaphoretic.  HENT:     Head: Normocephalic and atraumatic.  Eyes:     Conjunctiva/sclera: Conjunctivae normal.  Neck:     Musculoskeletal: Normal range of motion.  Cardiovascular:     Rate and Rhythm: Normal rate and regular rhythm.  Pulmonary:     Effort: Pulmonary effort is normal. No respiratory distress.  Abdominal:     General: There is no distension.     Palpations: Abdomen is soft.     Tenderness: There is abdominal tenderness in the right upper quadrant, right lower quadrant and suprapubic area. There is no guarding.  Musculoskeletal:     Lumbar back: She exhibits tenderness.  Skin:    General: Skin is warm and dry.     Findings: No erythema or rash.  Neurological:     Mental Status: She is alert and oriented to person, place, and time.     GCS: GCS eye subscore is 4. GCS verbal subscore is 5. GCS motor subscore is 6.     Sensory: Sensation is intact.     Motor: Motor function is intact.      ED Treatments / Results  Labs (all labs ordered are  listed, but only abnormal results are displayed) Labs Reviewed  CBC WITH DIFFERENTIAL/PLATELET - Abnormal; Notable for the following components:      Result Value   WBC 11.9 (*)    Neutro Abs 8.5 (*)    Monocytes Absolute 1.2 (*)    All other components within normal limits  COMPREHENSIVE METABOLIC PANEL - Abnormal; Notable for the following components:   Potassium 3.0 (*)    Chloride 97 (*)    Glucose, Bld 102 (*)    Total Bilirubin 2.0 (*)    All other components within normal limits  LIPASE, BLOOD    EKG EKG Interpretation  Date/Time:  Thursday January 12 2019 11:09:01 EDT Ventricular Rate:  73 PR Interval:    QRS Duration: 118 QT Interval:  407 QTC Calculation: 449 R Axis:   2 Text Interpretation:  Sinus rhythm Nonspecific intraventricular conduction delay No significant change since last tracing Confirmed by Gareth Morgan [phone]) on 01/12/2019 7:58:34 PM   Radiology Ct Abdomen Pelvis W Contrast  Result Date: 01/12/2019 CLINICAL DATA:  Mid low back pain and low abd pain x3days. EXAM: CT ABDOMEN AND PELVIS WITH CONTRAST TECHNIQUE: Multidetector CT imaging of the abdomen and pelvis was performed using the standard protocol following bolus administration of intravenous contrast. CONTRAST:  148mL OMNIPAQUE IOHEXOL 300 MG/ML  SOLN COMPARISON:  05/20/2017 FINDINGS: Lower chest: There is scarring within the RIGHT middle lobe and lung bases. Heart size is normal. Hepatobiliary: Scattered calcified nodules are identified within the liver,  consistent with remote granulomatous disease. Focal fatty infiltration identified adjacent to the falciform ligament. Status post cholecystectomy. There is prominence of the common bile duct, measuring 11 millimeters, likely secondary to cholecystectomy. Pancreas: Unremarkable. No pancreatic ductal dilatation or surrounding inflammatory changes. Spleen: Normal in size without focal abnormality. Adrenals/Urinary Tract: Adrenal glands are normal in  appearance. Subcentimeter low-attenuation lesions within both kidneys are likely cysts. No hydronephrosis. Courses of the ureters are partially obscured by metallic artifact in the pelvis. The bladder and visualized portion of the urethra are normal. Stomach/Bowel: Small hiatal hernia normal. Stomach is normal in appearance. Small bowel loops are normal caliber. The appendix is well seen and has a normal appearance. There are numerous colonic diverticula. There is circumferential thickening of the proximal sigmoid segment, associated with sigmoid mesenteric edema. Numerous diverticular are identified in this region and findings are consistent with acute diverticulitis. No evidence for perforation. No abscess. Vascular/Lymphatic: There is dense atherosclerotic calcification of the abdominal aorta. Although involved by atherosclerosis, there is vascular opacification of the celiac axis, superior mesenteric artery, and inferior mesenteric artery. Normal appearance of the portal venous system and inferior vena cava. No retroperitoneal or mesenteric adenopathy. Reproductive: Prior hysterectomy. No adnexal mass. Other: Small fat containing paraumbilical hernia. No ascites. Musculoskeletal: RIGHT hip arthroplasty and associated significant artifact in the pelvis. Status post posterior fusion with interbody fusion devices at L2-L4. Prior vertebral augmentation at T11 and L1. Significant degenerative changes throughout the lumbar spine. IMPRESSION: 1. Findings are consistent with acute sigmoid diverticulitis. No evidence for perforation or abscess. 2. Status post cholecystectomy and hysterectomy. 3. Small hiatal hernia. 4. Small fat containing paraumbilical hernia. 5. Postoperative and degenerative changes in the lumbar spine. 6. Aortic Atherosclerosis (ICD10-I70.0). Electronically Signed   By: Nolon Nations M.D.   On: 01/12/2019 14:39    Procedures Procedures (including critical care time)  Medications Ordered in  ED Medications  sodium chloride 0.9 % bolus 1,000 mL (0 mLs Intravenous Stopped 01/12/19 1343)  ondansetron (ZOFRAN) injection 4 mg (4 mg Intravenous Given 01/12/19 1152)  morphine 4 MG/ML injection 4 mg (4 mg Intravenous Given 01/12/19 1153)  ondansetron (ZOFRAN) injection 4 mg (4 mg Intravenous Given 01/12/19 1445)  iohexol (OMNIPAQUE) 300 MG/ML solution 100 mL (100 mLs Intravenous Contrast Given 01/12/19 1406)  morphine 4 MG/ML injection 4 mg (4 mg Intravenous Given 01/12/19 1445)  potassium chloride SA (KLOR-CON) CR tablet 40 mEq (40 mEq Oral Given 01/12/19 1548)     Initial Impression / Assessment and Plan / ED Course  I have reviewed the triage vital signs and the nursing notes.  Pertinent labs & imaging results that were available during my care of the patient were reviewed by me and considered in my medical decision making (see chart for details).         84 year old female with history of coronary artery disease, CKD, diverticulitis, hypertension, depression, giant cell arteritis on steroids, paroxysmal atrial fibrillation, rheumatoid arthritis, vocal cord dysfunction, hyperlipidemia, chronic low back pain for which she is seeing pain management, presents with concern for abdominal pain, nausea, vomiting, diarrhea and worsening back pain.  DDx includes UTI, gastroenteritis, appendicitis, diverticulitis, pancreatitis. Doubt ACS given abdominal tenderness on exam, no CP or dyspnea. No sign of urinary tract infection on urinalysis.  Lab work shows mild hypokalemia, mild leukocytosis without other significant acute changes.  CT abdomen pelvis completed which shows evidence of diverticulitis.  Suspect patient's back pain is likely secondary to her inability to take her home pain medications at  home due to her nausea and vomiting, she has no other symptoms of back pain emergency.  After nausea and pain medications in the emergency department, patient is able to tolerate p.o.  Discussed  that the treatment for diverticulitis would be oral antibiotics, nausea and pain control.  She is under a chronic pain contract because unable to make it to her chronic pain appointment today, and does not have continued medications.  Given acute on chronic pain with presence of new finding of diverticulitis, feeling short narcotic prescription is reasonable to help control her pain, recommend follow-up with her pain medication specialist for continued management of her chronic pain. Patient discharged in stable condition with understanding of reasons to return.   Given rx for oxycodone, bentyl, zofran, cipro and flagyl.   Final Clinical Impressions(s) / ED Diagnoses   Final diagnoses:  Diverticulitis    ED Discharge Orders         Ordered    ondansetron (ZOFRAN ODT) 4 MG disintegrating tablet  Every 8 hours PRN     01/12/19 1543    oxyCODONE (ROXICODONE) 5 MG immediate release tablet  Every 4 hours PRN     01/12/19 1543    dicyclomine (BENTYL) 20 MG tablet  2 times daily PRN     01/12/19 1543    ciprofloxacin (CIPRO) 500 MG tablet  Every 12 hours     01/12/19 1543    metroNIDAZOLE (FLAGYL) 500 MG tablet  3 times daily     01/12/19 1543           Gareth Morgan, MD 01/12/19 2003

## 2019-01-13 ENCOUNTER — Inpatient Hospital Stay (HOSPITAL_COMMUNITY)
Admission: EM | Admit: 2019-01-13 | Discharge: 2019-01-20 | DRG: 391 | Disposition: A | Discharge status: Home Health | Payer: Medicare Other | Attending: Internal Medicine | Admitting: Internal Medicine

## 2019-01-13 ENCOUNTER — Encounter (HOSPITAL_COMMUNITY): Payer: Self-pay | Admitting: Emergency Medicine

## 2019-01-13 ENCOUNTER — Inpatient Hospital Stay (HOSPITAL_COMMUNITY): Payer: Medicare Other

## 2019-01-13 ENCOUNTER — Other Ambulatory Visit: Payer: Self-pay

## 2019-01-13 DIAGNOSIS — I4891 Unspecified atrial fibrillation: Secondary | ICD-10-CM | POA: Diagnosis not present

## 2019-01-13 DIAGNOSIS — M069 Rheumatoid arthritis, unspecified: Secondary | ICD-10-CM | POA: Diagnosis present

## 2019-01-13 DIAGNOSIS — R6511 Systemic inflammatory response syndrome (SIRS) of non-infectious origin with acute organ dysfunction: Secondary | ICD-10-CM | POA: Diagnosis present

## 2019-01-13 DIAGNOSIS — R110 Nausea: Secondary | ICD-10-CM | POA: Diagnosis not present

## 2019-01-13 DIAGNOSIS — N183 Chronic kidney disease, stage 3 unspecified: Secondary | ICD-10-CM | POA: Diagnosis present

## 2019-01-13 DIAGNOSIS — M48061 Spinal stenosis, lumbar region without neurogenic claudication: Secondary | ICD-10-CM | POA: Diagnosis present

## 2019-01-13 DIAGNOSIS — J383 Other diseases of vocal cords: Secondary | ICD-10-CM | POA: Diagnosis present

## 2019-01-13 DIAGNOSIS — Z20828 Contact with and (suspected) exposure to other viral communicable diseases: Secondary | ICD-10-CM | POA: Diagnosis present

## 2019-01-13 DIAGNOSIS — R0600 Dyspnea, unspecified: Secondary | ICD-10-CM | POA: Diagnosis not present

## 2019-01-13 DIAGNOSIS — F0390 Unspecified dementia without behavioral disturbance: Secondary | ICD-10-CM | POA: Diagnosis present

## 2019-01-13 DIAGNOSIS — K5732 Diverticulitis of large intestine without perforation or abscess without bleeding: Secondary | ICD-10-CM | POA: Diagnosis not present

## 2019-01-13 DIAGNOSIS — I130 Hypertensive heart and chronic kidney disease with heart failure and stage 1 through stage 4 chronic kidney disease, or unspecified chronic kidney disease: Secondary | ICD-10-CM | POA: Diagnosis present

## 2019-01-13 DIAGNOSIS — K219 Gastro-esophageal reflux disease without esophagitis: Secondary | ICD-10-CM | POA: Diagnosis present

## 2019-01-13 DIAGNOSIS — G894 Chronic pain syndrome: Secondary | ICD-10-CM | POA: Diagnosis not present

## 2019-01-13 DIAGNOSIS — K5792 Diverticulitis of intestine, part unspecified, without perforation or abscess without bleeding: Secondary | ICD-10-CM | POA: Diagnosis not present

## 2019-01-13 DIAGNOSIS — I5032 Chronic diastolic (congestive) heart failure: Secondary | ICD-10-CM | POA: Diagnosis not present

## 2019-01-13 DIAGNOSIS — Z7952 Long term (current) use of systemic steroids: Secondary | ICD-10-CM | POA: Diagnosis not present

## 2019-01-13 DIAGNOSIS — M419 Scoliosis, unspecified: Secondary | ICD-10-CM | POA: Diagnosis present

## 2019-01-13 DIAGNOSIS — Z66 Do not resuscitate: Secondary | ICD-10-CM | POA: Diagnosis present

## 2019-01-13 DIAGNOSIS — Z8673 Personal history of transient ischemic attack (TIA), and cerebral infarction without residual deficits: Secondary | ICD-10-CM

## 2019-01-13 DIAGNOSIS — E876 Hypokalemia: Secondary | ICD-10-CM | POA: Diagnosis present

## 2019-01-13 DIAGNOSIS — G4700 Insomnia, unspecified: Secondary | ICD-10-CM | POA: Diagnosis present

## 2019-01-13 DIAGNOSIS — Z8249 Family history of ischemic heart disease and other diseases of the circulatory system: Secondary | ICD-10-CM | POA: Diagnosis not present

## 2019-01-13 DIAGNOSIS — N179 Acute kidney failure, unspecified: Secondary | ICD-10-CM | POA: Diagnosis not present

## 2019-01-13 DIAGNOSIS — R1084 Generalized abdominal pain: Secondary | ICD-10-CM | POA: Diagnosis not present

## 2019-01-13 DIAGNOSIS — R000 Tachycardia, unspecified: Secondary | ICD-10-CM | POA: Diagnosis not present

## 2019-01-13 DIAGNOSIS — M810 Age-related osteoporosis without current pathological fracture: Secondary | ICD-10-CM | POA: Diagnosis present

## 2019-01-13 DIAGNOSIS — I2510 Atherosclerotic heart disease of native coronary artery without angina pectoris: Secondary | ICD-10-CM | POA: Diagnosis present

## 2019-01-13 DIAGNOSIS — Z955 Presence of coronary angioplasty implant and graft: Secondary | ICD-10-CM

## 2019-01-13 DIAGNOSIS — R112 Nausea with vomiting, unspecified: Secondary | ICD-10-CM | POA: Diagnosis not present

## 2019-01-13 DIAGNOSIS — F05 Delirium due to known physiological condition: Secondary | ICD-10-CM | POA: Diagnosis not present

## 2019-01-13 DIAGNOSIS — G8929 Other chronic pain: Secondary | ICD-10-CM | POA: Diagnosis present

## 2019-01-13 DIAGNOSIS — Z808 Family history of malignant neoplasm of other organs or systems: Secondary | ICD-10-CM

## 2019-01-13 DIAGNOSIS — M316 Other giant cell arteritis: Secondary | ICD-10-CM | POA: Diagnosis not present

## 2019-01-13 DIAGNOSIS — D631 Anemia in chronic kidney disease: Secondary | ICD-10-CM | POA: Diagnosis present

## 2019-01-13 DIAGNOSIS — Z888 Allergy status to other drugs, medicaments and biological substances status: Secondary | ICD-10-CM

## 2019-01-13 DIAGNOSIS — I480 Paroxysmal atrial fibrillation: Secondary | ICD-10-CM | POA: Diagnosis not present

## 2019-01-13 DIAGNOSIS — E785 Hyperlipidemia, unspecified: Secondary | ICD-10-CM | POA: Diagnosis not present

## 2019-01-13 DIAGNOSIS — R197 Diarrhea, unspecified: Secondary | ICD-10-CM | POA: Diagnosis not present

## 2019-01-13 DIAGNOSIS — Z96641 Presence of right artificial hip joint: Secondary | ICD-10-CM | POA: Diagnosis present

## 2019-01-13 DIAGNOSIS — E860 Dehydration: Secondary | ICD-10-CM | POA: Diagnosis present

## 2019-01-13 DIAGNOSIS — Z7901 Long term (current) use of anticoagulants: Secondary | ICD-10-CM

## 2019-01-13 DIAGNOSIS — Z803 Family history of malignant neoplasm of breast: Secondary | ICD-10-CM | POA: Diagnosis not present

## 2019-01-13 DIAGNOSIS — R1030 Lower abdominal pain, unspecified: Secondary | ICD-10-CM | POA: Diagnosis present

## 2019-01-13 DIAGNOSIS — I361 Nonrheumatic tricuspid (valve) insufficiency: Secondary | ICD-10-CM | POA: Diagnosis not present

## 2019-01-13 DIAGNOSIS — R1111 Vomiting without nausea: Secondary | ICD-10-CM | POA: Diagnosis not present

## 2019-01-13 LAB — CBC WITH DIFFERENTIAL/PLATELET
Abs Immature Granulocytes: 0.06 10*3/uL (ref 0.00–0.07)
Basophils Absolute: 0 10*3/uL (ref 0.0–0.1)
Basophils Relative: 0 %
Eosinophils Absolute: 0 10*3/uL (ref 0.0–0.5)
Eosinophils Relative: 0 %
HCT: 36.2 % (ref 36.0–46.0)
Hemoglobin: 11.3 g/dL — ABNORMAL LOW (ref 12.0–15.0)
Immature Granulocytes: 1 %
Lymphocytes Relative: 14 %
Lymphs Abs: 1.4 10*3/uL (ref 0.7–4.0)
MCH: 29.7 pg (ref 26.0–34.0)
MCHC: 31.2 g/dL (ref 30.0–36.0)
MCV: 95.3 fL (ref 80.0–100.0)
Monocytes Absolute: 1.2 10*3/uL — ABNORMAL HIGH (ref 0.1–1.0)
Monocytes Relative: 12 %
Neutro Abs: 7.6 10*3/uL (ref 1.7–7.7)
Neutrophils Relative %: 73 %
Platelets: 194 10*3/uL (ref 150–400)
RBC: 3.8 MIL/uL — ABNORMAL LOW (ref 3.87–5.11)
RDW: 13.6 % (ref 11.5–15.5)
WBC: 10.4 10*3/uL (ref 4.0–10.5)
nRBC: 0 % (ref 0.0–0.2)

## 2019-01-13 LAB — LIPASE, BLOOD: Lipase: 34 U/L (ref 11–51)

## 2019-01-13 LAB — COMPREHENSIVE METABOLIC PANEL
ALT: 51 U/L — ABNORMAL HIGH (ref 0–44)
AST: 44 U/L — ABNORMAL HIGH (ref 15–41)
Albumin: 3.4 g/dL — ABNORMAL LOW (ref 3.5–5.0)
Alkaline Phosphatase: 141 U/L — ABNORMAL HIGH (ref 38–126)
Anion gap: 13 (ref 5–15)
BUN: 12 mg/dL (ref 8–23)
CO2: 26 mmol/L (ref 22–32)
Calcium: 8.7 mg/dL — ABNORMAL LOW (ref 8.9–10.3)
Chloride: 100 mmol/L (ref 98–111)
Creatinine, Ser: 1.26 mg/dL — ABNORMAL HIGH (ref 0.44–1.00)
GFR calc Af Amer: 46 mL/min — ABNORMAL LOW (ref >60)
GFR calc non Af Amer: 40 mL/min — ABNORMAL LOW (ref >60)
Glucose, Bld: 106 mg/dL — ABNORMAL HIGH (ref 70–99)
Potassium: 3.4 mmol/L — ABNORMAL LOW (ref 3.5–5.1)
Sodium: 139 mmol/L (ref 135–145)
Total Bilirubin: 2.7 mg/dL — ABNORMAL HIGH (ref 0.3–1.2)
Total Protein: 6.7 g/dL (ref 6.5–8.1)

## 2019-01-13 LAB — SARS CORONAVIRUS 2 (TAT 6-24 HRS): SARS Coronavirus 2: NEGATIVE

## 2019-01-13 MED ORDER — ACETAMINOPHEN 325 MG PO TABS
650.0000 mg | ORAL_TABLET | Freq: Four times a day (QID) | ORAL | Status: DC | PRN
  Administered 2019-01-13 – 2019-01-15 (×2): 650 mg via ORAL
  Filled 2019-01-13 (×2): qty 2

## 2019-01-13 MED ORDER — ONDANSETRON HCL 4 MG/2ML IJ SOLN
4.0000 mg | Freq: Once | INTRAMUSCULAR | Status: AC
  Administered 2019-01-13: 14:00:00 4 mg via INTRAVENOUS
  Filled 2019-01-13: qty 2

## 2019-01-13 MED ORDER — METRONIDAZOLE IN NACL 5-0.79 MG/ML-% IV SOLN
500.0000 mg | Freq: Three times a day (TID) | INTRAVENOUS | Status: DC
  Administered 2019-01-13 – 2019-01-16 (×10): 500 mg via INTRAVENOUS
  Filled 2019-01-13 (×11): qty 100

## 2019-01-13 MED ORDER — MORPHINE SULFATE (PF) 4 MG/ML IV SOLN
4.0000 mg | Freq: Once | INTRAVENOUS | Status: AC
  Administered 2019-01-13: 14:00:00 4 mg via INTRAVENOUS
  Filled 2019-01-13: qty 1

## 2019-01-13 MED ORDER — PROMETHAZINE HCL 25 MG PO TABS
12.5000 mg | ORAL_TABLET | Freq: Four times a day (QID) | ORAL | Status: DC | PRN
  Administered 2019-01-14 – 2019-01-17 (×2): 12.5 mg via ORAL
  Filled 2019-01-13 (×3): qty 1

## 2019-01-13 MED ORDER — POTASSIUM CHLORIDE 2 MEQ/ML IV SOLN
INTRAVENOUS | Status: DC
  Administered 2019-01-13 – 2019-01-14 (×2): via INTRAVENOUS
  Filled 2019-01-13 (×5): qty 1000

## 2019-01-13 MED ORDER — HYDROMORPHONE HCL 1 MG/ML IJ SOLN
1.0000 mg | Freq: Once | INTRAMUSCULAR | Status: AC
  Administered 2019-01-13: 15:00:00 1 mg via INTRAVENOUS
  Filled 2019-01-13: qty 1

## 2019-01-13 MED ORDER — TRAMADOL HCL 50 MG PO TABS
50.0000 mg | ORAL_TABLET | Freq: Once | ORAL | Status: DC
  Filled 2019-01-13 (×2): qty 1

## 2019-01-13 MED ORDER — DROPERIDOL 2.5 MG/ML IJ SOLN
1.2500 mg | Freq: Once | INTRAMUSCULAR | Status: AC
  Administered 2019-01-13: 15:00:00 1.25 mg via INTRAVENOUS
  Filled 2019-01-13: qty 2

## 2019-01-13 MED ORDER — LEVOFLOXACIN IN D5W 750 MG/150ML IV SOLN
750.0000 mg | Freq: Once | INTRAVENOUS | Status: AC
  Administered 2019-01-13: 14:00:00 750 mg via INTRAVENOUS
  Filled 2019-01-13: qty 150

## 2019-01-13 MED ORDER — ENOXAPARIN SODIUM 80 MG/0.8ML ~~LOC~~ SOLN
1.0000 mg/kg | Freq: Two times a day (BID) | SUBCUTANEOUS | Status: DC
  Administered 2019-01-13 – 2019-01-14 (×2): 75 mg via SUBCUTANEOUS
  Filled 2019-01-13: qty 0.75
  Filled 2019-01-13: qty 0.8
  Filled 2019-01-13: qty 0.75

## 2019-01-13 MED ORDER — ACETAMINOPHEN 650 MG RE SUPP: 650.0000 mg | Freq: Four times a day (QID) | RECTAL | Status: DC | PRN

## 2019-01-13 MED ORDER — SODIUM CHLORIDE 0.9 % IV BOLUS
1000.0000 mL | Freq: Once | INTRAVENOUS | Status: AC
  Administered 2019-01-13: 14:00:00 1000 mL via INTRAVENOUS

## 2019-01-13 MED ORDER — METOPROLOL TARTRATE 5 MG/5ML IV SOLN
5.0000 mg | Freq: Four times a day (QID) | INTRAVENOUS | Status: DC
  Administered 2019-01-13 – 2019-01-15 (×8): 5 mg via INTRAVENOUS
  Filled 2019-01-13 (×8): qty 5

## 2019-01-13 MED ORDER — LEVOFLOXACIN IN D5W 750 MG/150ML IV SOLN
750.0000 mg | INTRAVENOUS | Status: DC
  Administered 2019-01-15: 14:00:00 750 mg via INTRAVENOUS
  Filled 2019-01-13: qty 150

## 2019-01-13 MED ORDER — LIDOCAINE 5 % EX PTCH
1.0000 | MEDICATED_PATCH | CUTANEOUS | Status: DC
  Administered 2019-01-15 – 2019-01-20 (×4): 1 via TRANSDERMAL
  Filled 2019-01-13 (×8): qty 1

## 2019-01-13 MED ORDER — MORPHINE SULFATE (PF) 4 MG/ML IV SOLN
4.0000 mg | Freq: Once | INTRAVENOUS | Status: AC
  Administered 2019-01-13: 22:00:00 4 mg via INTRAVENOUS
  Filled 2019-01-13: qty 1

## 2019-01-13 NOTE — ED Notes (Signed)
Pt requesting pain and nausea medication. RN made aware.

## 2019-01-13 NOTE — ED Provider Notes (Signed)
[facility] DEPT Provider Note   CSN: [id number] Arrival date & time: 01/13/19  1134     History   Chief Complaint Chief Complaint  Patient presents with  . Emesis  . Nausea  . Diarrhea  . Fatigue    HPI Betty Jackson is a 84 y.o. female with a history of coronary artery disease, hypertension, hyperlipidemia, chronic back pain, temporal arteritis (on 10 mg prednisone daily), A. fib on Eliquis presenting to the emergency department with abdominal pain.  Patient was seen in the ED yesterday for similar complaints of abdominal pain which began 2 days prior.  She had a work-up and CT scan which were remarkable for sigmoid diverticulitis.  She was discharged home on oral antiemetics and oral antibiotics.  However since arriving home she is continued to have nausea and vomiting has been unable to keep down any of her medications.  This includes her chronic pain medications and her steroids.  Subsequently her back pain is significantly worsened and is now her chief complaint.  This is her chronic back pain.  Her daughter at the bedside reports concerns that the patient cannot tolerate any p.o. fluids and cannot keep down medications.  Patient has not taken any of her morning medicines including Eliquis.  Today was the first dose that she missed of her Eliquis.  Patient reports having continued diarrhea, which is nonbloody.  She has nausea.  She had occasional dry heaves.     HPI  Past Medical History:  Diagnosis Date  . Abdominal pain, epigastric 06/22/2013  . Abdominal pain, other specified site 12/24/2011  . Abnormal urinalysis 05/20/2017  . Acute respiratory failure with hypoxia ([place]) 05/20/2017  . Anemia, unspecified 10/28/2012  . Anxiety state, unspecified 10/28/2012  . Bruises easily    d/t being on prednisone and plavix  . CAD (coronary artery disease)    a. Stent RCA in [facility];  b. Cath approx 2009 - nonobs per pt report.  . Cataract    immature on the left eye  . Chronic insomnia 02/07/2013  . Chronic lower back pain    scoliosis  . CKD (chronic kidney disease) stage 3, GFR 30-59 ml/min   . Complication of anesthesia    pt has a very high tolerance to meds  . Coronary disease 05/20/2017  . CVA (cerebral infarction) 10/29/2012  . DDD (degenerative disc disease)   . Depression   . Diverticulitis    hx of  . Diverticulosis   . Elevated transaminase level 02/07/2013  . Enteritis   . GERD (gastroesophageal reflux disease) 09/01/2010  . Giant cell arteritis ([place])   . Hemorrhoids   . Herniated nucleus pulposus, L5-S1, right 11/04/2015  . History of scabies   . HTN (hypertension) 05/20/2017  . Hyperlipidemia    takes Lipitor daily  . Hypertension    takes Amlodipine,Losartan,Metoprolol,and HCTZ daily  . Incontinence of urine   . Influenza A 05/20/2017  . Insomnia    takes Restoril nightly  . Lumbar stenosis 04/25/2013  . Major depressive disorder, recurrent episode, moderate ([place]) 07/16/2013  . Migraines    "back in my 20's; none since" (10/28/2012)  . Osteoporosis   . PAF (paroxysmal atrial fibrillation) ([place]) 2011   a. lone epidode in 2011 according to notes.  . Rheumatoid arthritis ([place])   . Scoliosis   . Sepsis ([place]) 05/20/2017  . Sinus bradycardia    a. on chronic bb  . Temporal arteritis ([place]) 2011   a. followed @  [facility]; potential flareup 10/28/2012/notes 10/28/2012  . Vocal cord dysfunction    "they don't operate properly" (10/28/2012)    Patient Active Problem List   Diagnosis Date Noted  . Acute diverticulitis 01/13/2019  . AKI (acute kidney injury) ([place]) 01/13/2019  . Chronic pain 01/13/2019  . Chronic low back pain 07/10/2018  . Influenza A 05/20/2017  . Sepsis ([place]) 05/20/2017  . Acute respiratory failure with hypoxia ([place]) 05/20/2017  . CKD (chronic kidney disease) stage 3, GFR 30-59 ml/min 05/20/2017  . PAF (paroxysmal atrial fibrillation) ([place]) 05/20/2017  . HTN (hypertension) 05/20/2017  . Giant cell  arteritis ([place]) 05/20/2017  . Coronary disease 05/20/2017  . Abnormal urinalysis 05/20/2017  . Osteoporosis 05/08/2016  . Herniated nucleus pulposus, L5-S1, right 11/04/2015  . Major depressive disorder, recurrent episode, moderate ([place]) 07/16/2013  . Abdominal pain, epigastric 06/22/2013  . Lumbar stenosis 04/25/2013  . Chronic insomnia 02/07/2013  . Elevated transaminase level 02/07/2013  . CVA (cerebral infarction) 10/29/2012  . Visual changes 10/28/2012  . Depression 10/28/2012  . Anxiety state, unspecified 10/28/2012  . Anemia, unspecified 10/28/2012  . Nonspecific (abnormal) findings on radiological and other examination of gastrointestinal tract 01/25/2012  . Hyponatremia 01/24/2012  . Hematuria 01/24/2012  . Enteritis 12/24/2011  . Abdominal pain, other specified site 12/24/2011  . Leg pain, right 02/18/2011  . Temporal arteritis ([place]) 09/01/2010  . Hypertension 09/01/2010  . Hyperlipidemia 09/01/2010  . History of atrial fibrillation 09/01/2010  . CAD (coronary artery disease) 09/01/2010  . GERD (gastroesophageal reflux disease) 09/01/2010  . Insomnia 09/01/2010    Past Surgical History:  Procedure Laterality Date  . ABDOMINAL HYSTERECTOMY  ~ 1984   vaginally  . BACK SURGERY  7-63yrs ago   X Stop  . BLADDER SUSPENSION  2001  . BREAST BIOPSY Right   . CATARACT EXTRACTION W/ INTRAOCULAR LENS IMPLANT Right ~ 08/2012  . COLONOSCOPY  01/26/2012   Procedure: COLONOSCOPY;  Surgeon: Ladene Artist, MD,FACG;  Location: [facility] ENDOSCOPY;  Service: Endoscopy;  Laterality: N/A;  note the EGD is possible  . CORONARY ANGIOPLASTY WITH STENT PLACEMENT  2006   X 1 stent  . EPIDURAL BLOCK INJECTION    . ESOPHAGOGASTRODUODENOSCOPY  01/26/2012   Procedure: ESOPHAGOGASTRODUODENOSCOPY (EGD);  Surgeon: Ladene Artist, MD,FACG;  Location: [facility] ENDOSCOPY;  Service: Endoscopy;  Laterality: N/A;  . HEMIARTHROPLASTY HIP Right 2012  . LAPAROSCOPIC CHOLECYSTECTOMY  2001  . LUMBAR FUSION  03/2013   . LUMBAR LAMINECTOMY/DECOMPRESSION MICRODISCECTOMY Right 11/04/2015   Procedure: Right Lumbar Five-Sacral One Microdiskectomy;  Surgeon: Kristeen Miss, MD;  Location: [place] NEURO ORS;  Service: Neurosurgery;  Laterality: Right;  Right L5-S1 Microdiskectomy  . moles removed that required stiches     one on leg and one on face  . TEMPORAL ARTERY BIOPSY / LIGATION Bilateral 2011  . TONSILLECTOMY AND ADENOIDECTOMY     at age 84  . TUBAL LIGATION  ~ 1982  . X-STOP IMPLANTATION  ~ 2010   "lower back" (10/28/2012)     OB History   No obstetric history on file.      Home Medications    Prior to Admission medications   Medication Sig Start Date End Date Taking? Authorizing Provider  cholecalciferol (VITAMIN D) 1000 UNITS tablet Take 1,000 Units by mouth daily.    Yes [provider]  ciprofloxacin (CIPRO) 500 MG tablet Take 1 tablet (500 mg total) by mouth every 12 (twelve) hours for 14 days. 01/12/19 01/26/19 Yes Gareth Morgan, MD  dicyclomine (BENTYL) 20 MG  tablet Take 1 tablet (20 mg total) by mouth 2 (two) times daily as needed for spasms. 01/12/19  Yes Schlossman, Erin, MD  ELIQUIS 5 MG TABS tablet TAKE 1 TABLET BY MOUTH TWICE A DAY Patient taking differently: Take 5 mg by mouth 2 (two) times daily.  11/15/18  Yes Burnell Blanks, MD  escitalopram (LEXAPRO) 10 MG tablet TAKE 1 TABLET BY MOUTH EVERY DAY Patient taking differently: Take 10 mg by mouth daily.  12/14/18  Yes Burchette, Alinda Sierras, MD  furosemide (LASIX) 40 MG tablet Take 1 tablet (40mg ) by mouth twice a day and an extra 1/2 tablet (20mg ) twice a day as needed for edema. 07/14/18  Yes Burnell Blanks, MD  HYDROcodone-acetaminophen (NORCO/VICODIN) 5-325 MG tablet Take one tablet by mouth every 8 hours as needed for pain. May refill in two months. Patient taking differently: Take 1 tablet by mouth every 6 (six) hours as needed for moderate pain.  08/05/18  Yes Burchette, Alinda Sierras, MD  losartan (COZAAR) 100 MG tablet  TAKE 1 TABLET BY MOUTH EVERY DAY Patient taking differently: Take 100 mg by mouth daily.  12/14/18  Yes Burchette, Alinda Sierras, MD  metoprolol tartrate (LOPRESSOR) 50 MG tablet TAKE 1 AND 1/2 TABLET BY MOUTH TWICE A DAY. TAKE 1 EXTRA TABLET AS NEEDED FOR RAPID HEART RATE, OVER 100 BPM. Patient taking differently: Take 25-50 mg by mouth 2 (two) times daily. May take an additional tablet as needed for rapid heart rate 09/23/18  Yes Imogene Burn, PA-C  metroNIDAZOLE (FLAGYL) 500 MG tablet Take 1 tablet (500 mg total) by mouth 3 (three) times daily for 14 days. 01/12/19 01/26/19 Yes Gareth Morgan, MD  ondansetron (ZOFRAN ODT) 4 MG disintegrating tablet Take 1 tablet (4 mg total) by mouth every 8 (eight) hours as needed for nausea or vomiting. 01/12/19  Yes Gareth Morgan, MD  oxyCODONE (ROXICODONE) 5 MG immediate release tablet Take 1 tablet (5 mg total) by mouth every 4 (four) hours as needed for severe pain. 01/12/19  Yes Gareth Morgan, MD  pantoprazole (PROTONIX) 40 MG tablet Take 1 tablet (40 mg total) by mouth daily. 01/11/19  Yes Burchette, Alinda Sierras, MD  potassium chloride SA (K-DUR,KLOR-CON) 20 MEQ tablet Take 1 tablet (20 mEq total) by mouth daily. 06/08/17  Yes Burchette, Alinda Sierras, MD  predniSONE (DELTASONE) 10 MG tablet 4 tablets for 3 days, 3 tablets for 3 days, 2 tablets for 3 days, 1 tablet continue Patient taking differently: Take 10 mg by mouth daily.  05/26/17  Yes Reyne Dumas, MD  vitamin C (ASCORBIC ACID) 500 MG tablet Take 1,000 mg by mouth daily as needed (for sore throat).    Yes [provider]  vitamin E (VITAMIN E) 400 UNIT capsule Take 400 Units by mouth daily.   Yes [provider]  lidocaine (LIDODERM) 5 % Place 1 patch onto the skin daily. Remove & Discard patch within 12 hours or as directed by MD Patient not taking: Reported on 01/13/2019 08/17/18   Nuala Alpha A, PA-C  rosuvastatin (CRESTOR) 10 MG tablet TAKE 1 TABLET BY MOUTH EVERY DAY Patient not  taking: Reported on 01/13/2019 06/28/18   Imogene Burn, PA-C    Family History Family History  Problem Relation Age of Onset  . Brain cancer Mother 23  . Heart attack Father   . Breast cancer Daughter 29  . Heart disease Other   . Hypertension Other   . Arthritis Neg Hx   . Colon cancer Neg  Hx   . Osteoporosis Neg Hx     Social History Social History   Tobacco Use  . Smoking status: Never Smoker  . Smokeless tobacco: Never Used  Substance Use Topics  . Alcohol use: Yes    Comment: socially  . Drug use: No     Allergies   Dextromethorphan   Review of Systems Review of Systems  Constitutional: Positive for appetite change and chills. Negative for fever.  Eyes: Negative for photophobia and visual disturbance.  Respiratory: Negative for cough and shortness of breath.   Cardiovascular: Negative for chest pain and palpitations.  Gastrointestinal: Positive for abdominal pain, diarrhea, nausea and vomiting.  Musculoskeletal: Positive for arthralgias, back pain and myalgias.  Neurological: Negative for syncope and light-headedness.  All other systems reviewed and are negative.    Physical Exam Updated Vital Signs BP (!) 142/66   Pulse 99   Temp 99.3 F (37.4 C) (Oral)   Resp 18   Ht 5' 4.5" (1.638 m)   Wt 72.5 kg   SpO2 91%   BMI 27.01 kg/m   Physical Exam Vitals signs and nursing note reviewed.  Constitutional:      General: She is in acute distress.     Appearance: She is well-developed.     Comments: Curled up on right side  HENT:     Head: Normocephalic and atraumatic.  Eyes:     Conjunctiva/sclera: Conjunctivae normal.  Neck:     Musculoskeletal: Neck supple.  Cardiovascular:     Rate and Rhythm: Regular rhythm. Tachycardia present.     Pulses: Normal pulses.  Pulmonary:     Effort: Pulmonary effort is normal. No respiratory distress.     Breath sounds: Normal breath sounds.  Abdominal:     General: There is no distension or abdominal  bruit.     Palpations: Abdomen is soft. There is no pulsatile mass.     Tenderness: There is no abdominal tenderness. There is no guarding or rebound. Negative signs include Murphy's sign and McBurney's sign.  Musculoskeletal:     Comments: Paraspinal muscular ttp  Skin:    General: Skin is warm and dry.  Neurological:     General: No focal deficit present.     Mental Status: She is alert and oriented to person, place, and time.     Comments: No spinal midline tenderness      ED Treatments / Results  Labs (all labs ordered are listed, but only abnormal results are displayed) Labs Reviewed  COMPREHENSIVE METABOLIC PANEL - Abnormal; Notable for the following components:      Result Value   Potassium 3.4 (*)    Glucose, Bld 106 (*)    Creatinine, Ser 1.26 (*)    Calcium 8.7 (*)    Albumin 3.4 (*)    AST 44 (*)    ALT 51 (*)    Alkaline Phosphatase 141 (*)    Total Bilirubin 2.7 (*)    GFR calc non Af Amer 40 (*)    GFR calc Af Amer 46 (*)    All other components within normal limits  CBC WITH DIFFERENTIAL/PLATELET - Abnormal; Notable for the following components:   RBC 3.80 (*)    Hemoglobin 11.3 (*)    Monocytes Absolute 1.2 (*)    All other components within normal limits  SARS CORONAVIRUS 2 (TAT 6-24 HRS)  LIPASE, BLOOD  BASIC METABOLIC PANEL  CBC    EKG None  Radiology Ct Abdomen Pelvis W Contrast  Result Date: 01/12/2019 CLINICAL DATA:  Mid low back pain and low abd pain x3days. EXAM: CT ABDOMEN AND PELVIS WITH CONTRAST TECHNIQUE: Multidetector CT imaging of the abdomen and pelvis was performed using the standard protocol following bolus administration of intravenous contrast. CONTRAST:  187mL OMNIPAQUE IOHEXOL 300 MG/ML  SOLN COMPARISON:  05/20/2017 FINDINGS: Lower chest: There is scarring within the RIGHT middle lobe and lung bases. Heart size is normal. Hepatobiliary: Scattered calcified nodules are identified within the liver, consistent with remote  granulomatous disease. Focal fatty infiltration identified adjacent to the falciform ligament. Status post cholecystectomy. There is prominence of the common bile duct, measuring 11 millimeters, likely secondary to cholecystectomy. Pancreas: Unremarkable. No pancreatic ductal dilatation or surrounding inflammatory changes. Spleen: Normal in size without focal abnormality. Adrenals/Urinary Tract: Adrenal glands are normal in appearance. Subcentimeter low-attenuation lesions within both kidneys are likely cysts. No hydronephrosis. Courses of the ureters are partially obscured by metallic artifact in the pelvis. The bladder and visualized portion of the urethra are normal. Stomach/Bowel: Small hiatal hernia normal. Stomach is normal in appearance. Small bowel loops are normal caliber. The appendix is well seen and has a normal appearance. There are numerous colonic diverticula. There is circumferential thickening of the proximal sigmoid segment, associated with sigmoid mesenteric edema. Numerous diverticular are identified in this region and findings are consistent with acute diverticulitis. No evidence for perforation. No abscess. Vascular/Lymphatic: There is dense atherosclerotic calcification of the abdominal aorta. Although involved by atherosclerosis, there is vascular opacification of the celiac axis, superior mesenteric artery, and inferior mesenteric artery. Normal appearance of the portal venous system and inferior vena cava. No retroperitoneal or mesenteric adenopathy. Reproductive: Prior hysterectomy. No adnexal mass. Other: Small fat containing paraumbilical hernia. No ascites. Musculoskeletal: RIGHT hip arthroplasty and associated significant artifact in the pelvis. Status post posterior fusion with interbody fusion devices at L2-L4. Prior vertebral augmentation at T11 and L1. Significant degenerative changes throughout the lumbar spine. IMPRESSION: 1. Findings are consistent with acute sigmoid  diverticulitis. No evidence for perforation or abscess. 2. Status post cholecystectomy and hysterectomy. 3. Small hiatal hernia. 4. Small fat containing paraumbilical hernia. 5. Postoperative and degenerative changes in the lumbar spine. 6. Aortic Atherosclerosis (ICD10-I70.0). Electronically Signed   By: Nolon Nations M.D.   On: 01/12/2019 14:39   Dg Chest Port 1 View  Result Date: 01/13/2019 CLINICAL DATA:  Dyspnea. EXAM: PORTABLE CHEST 1 VIEW COMPARISON:  11/25/2016 FINDINGS: Normal heart size. Lungs are hypoinflated. No pleural effusion identified. Linear scar like densities versus parenchymal banding identified within the left midlung and left base. No airspace opacities identified. IMPRESSION: 1. No acute cardiopulmonary abnormalities. 2. Linear scar like densities in the left midlung and left base. Electronically Signed   By: Kerby Moors M.D.   On: 01/13/2019 17:10    Procedures Procedures (including critical care time)  Medications Ordered in ED Medications  metroNIDAZOLE (FLAGYL) IVPB 500 mg (500 mg Intravenous New Bag/Given 01/13/19 1545)  lidocaine (LIDODERM) 5 % 1 patch (1 patch Transdermal Refused 01/13/19 1743)  acetaminophen (TYLENOL) tablet 650 mg (has no administration in time range)    Or  acetaminophen (TYLENOL) suppository 650 mg (has no administration in time range)  promethazine (PHENERGAN) tablet 12.5 mg (has no administration in time range)  lactated ringers 1,000 mL with potassium chloride 20 mEq infusion (has no administration in time range)  metoprolol tartrate (LOPRESSOR) injection 5 mg (5 mg Intravenous Given 01/13/19 1744)  levofloxacin (LEVAQUIN) IVPB 500 mg (has no administration in time range)  enoxaparin (LOVENOX) injection 75 mg (75 mg Subcutaneous Given 01/13/19 1744)  sodium chloride 0.9 % bolus 1,000 mL (0 mLs Intravenous Stopped 01/13/19 1542)  ondansetron (ZOFRAN) injection 4 mg (4 mg Intravenous Given 01/13/19 1347)  morphine 4 MG/ML injection 4  mg (4 mg Intravenous Given 01/13/19 1347)  levofloxacin (LEVAQUIN) IVPB 750 mg (0 mg Intravenous Stopped 01/13/19 1529)  droperidol (INAPSINE) 2.5 MG/ML injection 1.25 mg (1.25 mg Intravenous Given 01/13/19 1513)  HYDROmorphone (DILAUDID) injection 1 mg (1 mg Intravenous Given 01/13/19 1513)     Initial Impression / Assessment and Plan / ED Course  I have reviewed the triage vital signs and the nursing notes.  Pertinent labs & imaging results that were available during my care of the patient were reviewed by me and considered in my medical decision making (see chart for details).  84 yo female w/ a fib on eliquis, chronic back pain, presenting with nausea, vomiting, weakness in the setting of diagnosis of acute diverticulitis.  CT scan yesterday with sigmoid diverticulitis.  Patient reporting 3 days of abdominal pain and vomiting.  Unfortunately she was not able to tolerate a PO regimen of antibiotics at home after leaving the ED, and has not been able to keep down any of her medications.  This includes her chronic back pain medications and her eliquis.  Now her primary complaint is her worsening back pain.  She has a relatively benign abdominal exam, but diffuse muscle spasms in her back.    We will recheck bloodwork to trend WBC, LFT's Plan to give IVF, IV morphine, IV zofran Do not suspect acute abdomen or AAA based on her clinical presentation - do not think she needs repeat imaging.    I have a lower suspicion for acute mesenteric ischemia given her overall clinical appearance - no severe abdominal pain on exam, no active vomiting, and no reported bloody bowel movements.   I anticipate she will need admission for IV antibiotics and fluid hydration.  Clinical Course as of Jan 13 1751  Fri Jan 13, 2019  1525 Signout given to hospitalist   [MT]    Clinical Course User Index [MT] Langston Masker Carola Rhine, MD     Final Clinical Impressions(s) / ED Diagnoses   Final diagnoses:   Diverticulitis  Nausea vomiting and diarrhea    ED Discharge Orders    None       Wyvonnia Dusky, MD 01/13/19 1752

## 2019-01-13 NOTE — ED Triage Notes (Signed)
Patient is from home and presents with weakness, nausea, vomiting and diarrhea. She has had allthe symptoms for 3 days now except the weakness which occurred post discharge from Med center yesterday. She was discharged yesterday with a diagnosis of diverticulitis and sent home with prescriptions. Since then she has been unable to take medications due to nausea and vomiting. When she did attempt to take medications she vomited them up.    EMS vitals: 140/70 BP 90 HR 16 Resp Rate 98% O2 sat on room air 98.1 Temp

## 2019-01-13 NOTE — H&P (Signed)
History and Physical    DOA: 01/13/2019  PCP: Eulas Post, MD  Patient coming from: home  Chief Complaint: abdominal pain, nausea/vomiting  HPI: Betty Jackson is a 84 y.o. female with history h/o CAD status post PTCA, chronic low back pain, CKD stage III, DDD, GERD, CVA, paroxysmal A. fib on Eliquis, rheumatoid arthritis/temporal arteritis on chronic prednisolone, anxiety/depression/chronic insomnia and prior history of diverticulitis presented to the ED had a televisit with her primary care doctor on 10/14 when she endorsed abdominal symptoms including mild pain nausea and one episode of vomiting.  She was advised to take PPI and report to ED if has worsening symptoms.  She presented to [facility] ED on 10/15 with worsening abdominal pain/back pain/p.o. diet and medication intolerance.  Work-up in the ED revealed acute diverticulitis as seen on the CT abdomen/pelvis.  She also reported running out of chronic pain medications (had appointment on 10/15).  Given overall nontoxic appearance, patient was discharged home with prescription for oxycodone, Bentyl, Zofran Cipro and Flagyl.  She apparently tolerated Sprite in the ED yesterday but brought back to the ED today by daughter as patient unable to tolerate oral meds and been vomiting at home since last night again.  She also has severe back pain now as she has not been able to tolerate p.o. opiates.  No report of dyspnea/cough or complaints of shortness of breath.  Patient shivering and complaining of feeling cold in the ED.T-max in the ED has been 99.6, she has been tachycardic with heart rate in low [id number], systolic blood pressure fluctuating between 120-170, mildly tachypneic with O2 sat 91 to 94% on room air (improved to 97% on taking deep breath).  WBC 10.4 (improved from 11.9 yesterday), hemoglobin 11.3 (12.3 yesterday), platelets 194.  Sodium at 139, potassium 3.4, chloride 100, bicarb 26, BUN 12, creatinine 1.26 (was 0.73  yesterday).  Covid test sent today, results pending.  No sick contacts per daughter.   Review of Systems: As per HPI otherwise 10 point review of systems negative.    Past Medical History:  Diagnosis Date   Abdominal pain, epigastric 06/22/2013   Abdominal pain, other specified site 12/24/2011   Abnormal urinalysis 05/20/2017   Acute respiratory failure with hypoxia ([place]) 05/20/2017   Anemia, unspecified 10/28/2012   Anxiety state, unspecified 10/28/2012   Bruises easily    d/t being on prednisone and plavix   CAD (coronary artery disease)    a. Stent RCA in [facility];  b. Cath approx 2009 - nonobs per pt report.   Cataract    immature on the left eye   Chronic insomnia 02/07/2013   Chronic lower back pain    scoliosis   CKD (chronic kidney disease) stage 3, GFR 0000000 ml/min    Complication of anesthesia    pt has a very high tolerance to meds   Coronary disease 05/20/2017   CVA (cerebral infarction) 10/29/2012   DDD (degenerative disc disease)    Depression    Diverticulitis    hx of   Diverticulosis    Elevated transaminase level 02/07/2013   Enteritis    GERD (gastroesophageal reflux disease) 09/01/2010   Giant cell arteritis (HCC)    Hemorrhoids    Herniated nucleus pulposus, L5-S1, right 11/04/2015   History of scabies    HTN (hypertension) 05/20/2017   Hyperlipidemia    takes Lipitor daily   Hypertension    takes Amlodipine,Losartan,Metoprolol,and HCTZ daily   Incontinence of urine  Influenza A 05/20/2017   Insomnia    takes Restoril nightly   Lumbar stenosis 04/25/2013   Major depressive disorder, recurrent episode, moderate ([place]) 07/16/2013   Migraines    "back in my 20's; none since" (10/28/2012)   Osteoporosis    PAF (paroxysmal atrial fibrillation) ([place]) 2011   a. lone epidode in 2011 according to notes.   Rheumatoid arthritis ([place])    Scoliosis    Sepsis ([place]) 05/20/2017   Sinus bradycardia    a. on chronic bb    Temporal arteritis ([place]) 2011   a. followed @ [facility]; potential flareup 10/28/2012/notes 10/28/2012   Vocal cord dysfunction    "they don't operate properly" (10/28/2012)    Past Surgical History:  Procedure Laterality Date   ABDOMINAL HYSTERECTOMY  ~ 1984   vaginally   BACK SURGERY  7-90yrs ago   X Stop   BLADDER SUSPENSION  2001   BREAST BIOPSY Right    CATARACT EXTRACTION W/ INTRAOCULAR LENS IMPLANT Right ~ 08/2012   COLONOSCOPY  01/26/2012   Procedure: COLONOSCOPY;  Surgeon: Ladene Artist, MD,FACG;  Location: [facility] ENDOSCOPY;  Service: Endoscopy;  Laterality: N/A;  note the EGD is possible   CORONARY ANGIOPLASTY WITH STENT PLACEMENT  2006   X 1 stent   EPIDURAL BLOCK INJECTION     ESOPHAGOGASTRODUODENOSCOPY  01/26/2012   Procedure: ESOPHAGOGASTRODUODENOSCOPY (EGD);  Surgeon: Ladene Artist, MD,FACG;  Location: [facility] ENDOSCOPY;  Service: Endoscopy;  Laterality: N/A;   HEMIARTHROPLASTY HIP Right 2012   LAPAROSCOPIC CHOLECYSTECTOMY  2001   LUMBAR FUSION  03/2013   LUMBAR LAMINECTOMY/DECOMPRESSION MICRODISCECTOMY Right 11/04/2015   Procedure: Right Lumbar Five-Sacral One Microdiskectomy;  Surgeon: Kristeen Miss, MD;  Location: [facility]O [facility]S;  Service: Neurosurgery;  Laterality: Right;  Right L5-S1 Microdiskectomy   moles removed that required stiches     one on leg and one on face   TEMPORAL ARTERY BIOPSY / LIGATION Bilateral 2011   TONSILLECTOMY AND ADENOIDECTOMY     at age 47   TUBAL LIGATION  ~ [place]  ~ 2010   "lower back" (10/28/2012)    Social history:  reports that she has never smoked. She has never used smokeless tobacco. She reports current alcohol use. She reports that she does not use drugs.   Allergies  Allergen Reactions   Dextromethorphan Rash    Family History  Problem Relation Age of Onset   Brain cancer Mother 73   Heart attack Father    Breast cancer Daughter 41   Heart disease Other    Hypertension Other    Arthritis  Neg Hx    Colon cancer Neg Hx    Osteoporosis Neg Hx       Prior to Admission medications   Medication Sig Start Date End Date Taking? Authorizing Provider  calcium carbonate (OS-CAL) 600 MG TABS Take 600 mg by mouth daily.    [provider]  cholecalciferol (VITAMIN D) 1000 UNITS tablet Take 1,000 Units by mouth daily.     [provider]  ciprofloxacin (CIPRO) 500 MG tablet Take 1 tablet (500 mg total) by mouth every 12 (twelve) hours for 14 days. 01/12/19 01/26/19  Gareth Morgan, MD  dicyclomine (BENTYL) 20 MG tablet Take 1 tablet (20 mg total) by mouth 2 (two) times daily as needed for spasms. 01/12/19   Gareth Morgan, MD  ELIQUIS 5 MG TABS tablet TAKE 1 TABLET BY MOUTH TWICE A DAY 11/15/18   Burnell Blanks, MD  escitalopram [place]ous[place])  10 MG tablet TAKE 1 TABLET BY MOUTH EVERY DAY 12/14/18   Burchette, Alinda Sierras, MD  furosemide (LASIX) 40 MG tablet Take 1 tablet (40mg ) by mouth twice a day and an extra 1/2 tablet (20mg ) twice a day as needed for edema. 07/14/18   Burnell Blanks, MD  HYDROcodone-acetaminophen (NORCO/VICODIN) 5-325 MG tablet Take one tablet by mouth every 8 hours as needed for pain. May refill in two months. 08/05/18   Burchette, Alinda Sierras, MD  lidocaine (LIDODERM) 5 % Place 1 patch onto the skin daily. Remove & Discard patch within 12 hours or as directed by MD 08/17/18   Nuala Alpha A, PA-C  losartan (COZAAR) 100 MG tablet TAKE 1 TABLET BY MOUTH EVERY DAY 12/14/18   Burchette, Alinda Sierras, MD  metoprolol tartrate (LOPRESSOR) 50 MG tablet TAKE 1 AND 1/2 TABLET BY MOUTH TWICE A DAY. TAKE 1 EXTRA TABLET AS NEEDED FOR RAPID HEART RATE, OVER 100 BPM. 09/23/18   Imogene Burn, PA-C  metroNIDAZOLE (FLAGYL) 500 MG tablet Take 1 tablet (500 mg total) by mouth 3 (three) times daily for 14 days. 01/12/19 01/26/19  Gareth Morgan, MD  ondansetron (ZOFRAN ODT) 4 MG disintegrating tablet Take 1 tablet (4 mg total) by mouth every 8 (eight) hours as  needed for nausea or vomiting. 01/12/19   Gareth Morgan, MD  oxyCODONE (ROXICODONE) 5 MG immediate release tablet Take 1 tablet (5 mg total) by mouth every 4 (four) hours as needed for severe pain. 01/12/19   Gareth Morgan, MD  pantoprazole (PROTONIX) 40 MG tablet Take 1 tablet (40 mg total) by mouth daily. 01/11/19   Burchette, Alinda Sierras, MD  potassium chloride SA (K-DUR,KLOR-CON) 20 MEQ tablet Take 1 tablet (20 mEq total) by mouth daily. 06/08/17   Burchette, Alinda Sierras, MD  predniSONE (DELTASONE) 10 MG tablet 4 tablets for 3 days, 3 tablets for 3 days, 2 tablets for 3 days, 1 tablet continue 05/26/17   Reyne Dumas, MD  rosuvastatin (CRESTOR) 10 MG tablet TAKE 1 TABLET BY MOUTH EVERY DAY 06/28/18   Imogene Burn, PA-C  vitamin C (ASCORBIC ACID) 500 MG tablet Take 1,000 mg by mouth daily.     [provider]  vitamin E (VITAMIN E) 400 UNIT capsule Take 400 Units by mouth daily.    [provider]    Physical Exam: Vitals:   01/13/19 1616 01/13/19 1630 01/13/19 1700 01/13/19 1730  BP: (!) 97/46 (!) 119/50 (!) 124/108 (!) 142/66  Pulse: 98 94 (!) 103 99  Resp: 18     Temp:      TempSrc:      SpO2: 92% 90% 90% 91%  Weight:      Height:        Constitutional: Appears tremulous and asking for blankets?  Chills Eyes: PERRL, lids and conjunctivae normal ENMT: Mucous membranes are dry. Posterior pharynx clear of any exudate or lesions. Neck: normal, supple, no masses, no thyromegaly Respiratory: Reduced breath sounds at bases, otherwise clear to auscultation bilaterally, no wheezing, no crackles. Normal respiratory effort. No accessory muscle use.  Cardiovascular: Regular rate and rhythm, no murmurs / rubs / gallops. No extremity edema. 2+ pedal pulses. No carotid bruits.  Abdomen: no tenderness, no masses palpated. No hepatosplenomegaly. Bowel sounds positive.  Musculoskeletal: no clubbing / cyanosis. No joint deformity upper and lower extremities. Good ROM, no  contractures. Normal muscle tone.  Neurologic: CN 2-12 grossly intact. Sensation intact, DTR normal. Strength 5/5 in all 4.  Psychiatric: Normal judgment  and insight. Alert and oriented x 3. Normal mood.  SKIN/catheters: no rashes, lesions, ulcers. No induration  Labs on Admission: I have personally reviewed following labs and imaging studies  CBC: Recent Labs  Lab 01/12/19 1136 01/13/19 1230  WBC 11.9* 10.4  NEUTROABS 8.5* 7.6  HGB 12.3 11.3*  HCT 38.5 36.2  MCV 94.6 95.3  PLT 175 [id number]   Basic Metabolic Panel: Recent Labs  Lab 01/12/19 1136 01/13/19 1230  NA 139 139  K 3.0* 3.4*  CL 97* 100  CO2 29 26  GLUCOSE 102* 106*  BUN 11 12  CREATININE 0.73 1.26*  CALCIUM 8.9 8.7*   GFR: Estimated Creatinine Clearance: 34 mL/min (A) (by C-G formula based on SCr of 1.26 mg/dL (H)). Liver Function Tests: Recent Labs  Lab 01/12/19 1136 01/13/19 1230  AST 16 44*  ALT 14 51*  ALKPHOS 77 141*  BILITOT 2.0* 2.7*  PROT 6.8 6.7  ALBUMIN 3.7 3.4*   Recent Labs  Lab 01/12/19 1136 01/13/19 1230  LIPASE 30 34   No results for input(s): AMMONIA in the last 168 hours. Coagulation Profile: No results for input(s): INR, PROTIME in the last 168 hours. Cardiac Enzymes: No results for input(s): CKTOTAL, CKMB, CKMBINDEX, TROPONINI in the last 168 hours. BNP (last 3 results) No results for input(s): PROBNP in the last 8760 hours. HbA1C: No results for input(s): HGBA1C in the last 72 hours. CBG: No results for input(s): GLUCAP in the last 168 hours. Lipid Profile: No results for input(s): CHOL, HDL, LDLCALC, TRIG, CHOLHDL, LDLDIRECT in the last 72 hours. Thyroid Function Tests: No results for input(s): TSH, T4TOTAL, FREET4, T3FREE, THYROIDAB in the last 72 hours. Anemia Panel: No results for input(s): VITAMINB12, FOLATE, FERRITIN, TIBC, IRON, RETICCTPCT in the last 72 hours. Urine analysis:    Component Value Date/Time   COLORURINE YELLOW 05/20/2017 1444   APPEARANCEUR CLOUDY  (A) 05/20/2017 1444   LABSPEC 1.041 (H) 05/20/2017 1444   PHURINE 5.0 05/20/2017 1444   GLUCOSEU NEGATIVE 05/20/2017 1444   GLUCOSEU NEGATIVE 07/31/2016 1018   HGBUR MODERATE (A) 05/20/2017 1444   BILIRUBINUR NEGATIVE 05/20/2017 1444   BILIRUBINUR neg 07/31/2016 1012   KETONESUR NEGATIVE 05/20/2017 1444   PROTEINUR 30 (A) 05/20/2017 1444   UROBILINOGEN 0.2 07/31/2016 1018   NITRITE POSITIVE (A) 05/20/2017 1444   LEUKOCYTESUR LARGE (A) 05/20/2017 1444    Radiological Exams on Admission: Personally reviewed  Ct Abdomen Pelvis W Contrast  Result Date: 01/12/2019 CLINICAL DATA:  Mid low back pain and low abd pain x3days. EXAM: CT ABDOMEN AND PELVIS WITH CONTRAST TECHNIQUE: Multidetector CT imaging of the abdomen and pelvis was performed using the standard protocol following bolus administration of intravenous contrast. CONTRAST:  133mL OMNIPAQUE IOHEXOL 300 MG/ML  SOLN COMPARISON:  05/20/2017 FINDINGS: Lower chest: There is scarring within the RIGHT middle lobe and lung bases. Heart size is normal. Hepatobiliary: Scattered calcified nodules are identified within the liver, consistent with remote granulomatous disease. Focal fatty infiltration identified adjacent to the falciform ligament. Status post cholecystectomy. There is prominence of the common bile duct, measuring 11 millimeters, likely secondary to cholecystectomy. Pancreas: Unremarkable. No pancreatic ductal dilatation or surrounding inflammatory changes. Spleen: Normal in size without focal abnormality. Adrenals/Urinary Tract: Adrenal glands are normal in appearance. Subcentimeter low-attenuation lesions within both kidneys are likely cysts. No hydronephrosis. Courses of the ureters are partially obscured by metallic artifact in the pelvis. The bladder and visualized portion of the urethra are normal. Stomach/Bowel: Small hiatal hernia normal. Stomach is normal  in appearance. Small bowel loops are normal caliber. The appendix is well seen  and has a normal appearance. There are numerous colonic diverticula. There is circumferential thickening of the proximal sigmoid segment, associated with sigmoid mesenteric edema. Numerous diverticular are identified in this region and findings are consistent with acute diverticulitis. No evidence for perforation. No abscess. Vascular/Lymphatic: There is dense atherosclerotic calcification of the abdominal aorta. Although involved by atherosclerosis, there is vascular opacification of the celiac axis, superior mesenteric artery, and inferior mesenteric artery. Normal appearance of the portal venous system and inferior vena cava. No retroperitoneal or mesenteric adenopathy. Reproductive: Prior hysterectomy. No adnexal mass. Other: Small fat containing paraumbilical hernia. No ascites. Musculoskeletal: RIGHT hip arthroplasty and associated significant artifact in the pelvis. Status post posterior fusion with interbody fusion devices at L2-L4. Prior vertebral augmentation at T11 and L1. Significant degenerative changes throughout the lumbar spine. IMPRESSION: 1. Findings are consistent with acute sigmoid diverticulitis. No evidence for perforation or abscess. 2. Status post cholecystectomy and hysterectomy. 3. Small hiatal hernia. 4. Small fat containing paraumbilical hernia. 5. Postoperative and degenerative changes in the lumbar spine. 6. Aortic Atherosclerosis (ICD10-I70.0). Electronically Signed   By: Nolon Nations M.D.   On: 01/12/2019 14:39   Dg Chest Port 1 View  Result Date: 01/13/2019 CLINICAL DATA:  Dyspnea. EXAM: PORTABLE CHEST 1 VIEW COMPARISON:  11/25/2016 FINDINGS: Normal heart size. Lungs are hypoinflated. No pleural effusion identified. Linear scar like densities versus parenchymal banding identified within the left midlung and left base. No airspace opacities identified. IMPRESSION: 1. No acute cardiopulmonary abnormalities. 2. Linear scar like densities in the left midlung and left base.  Electronically Signed   By: Kerby Moors M.D.   On: 01/13/2019 17:10    EKG: Independently reviewed.  Sinus rhythm with QTC 449 ms     Assessment and Plan:   Principal Problem:   Acute diverticulitis Active Problems:   Temporal arteritis (HCC)   Lumbar stenosis   PAF (paroxysmal atrial fibrillation) (HCC)   AKI (acute kidney injury) (HCC)   Chronic pain   Tachycardia   Hypokalemia    1.  Acute diverticulitis : Failed outpatient treatment trial given dyspepsia, inability to tolerate p.o. meds.  Admit with IV antibiotics.  Patient initiated on IV Levaquin and Flagyl in the ED.  N.p.o. for today with IV fluids.  Can start diet when nausea vomiting improves.  Zofran did not help much, ED physician ordered droperidol.  No evidence of abscess on CT.  Leukocytosis improved from yesterday.  T-max less than 100.  Blood cultures deferred at this point  2.  AKI: Present on admission.  Likely due to dehydration/prerenal in the setting of poor oral intake and ongoing GI losses with nausea/vomiting.  Hold losartan/Lasix.  IV fluids as mentioned above.  BMP in a.m.  3.  Giant cell arteritis: On chronic prednisone 10 mg at baseline.  Resume in a.m. if able to tolerate p.o.  If cannot tolerate p.o., consider IV steroids.  4.  Hypertension: Transition home meds to IV until able to tolerate p.o.  IV beta-blockers to avoid reflex tachycardia  5.  Tachycardia: Secondary to pain versus reflex tachycardia due to p.o. beta-blocker intolerance versus opiate withdrawal versus problem #1.  Treat underlying cause.  6.  Transient hypoxia/tachypnea: O2 sats appeared to be fluctuating but improved to 97% when asked to take a deep breath.  Could be taking shallow breaths/hyperventilating due to pain.  Will obtain baseline chest x-ray to rule out atelectasis/aspiration in the  setting of vomiting at home.  On antibiotics for problem #1  7.  Exacerbation of chronic back pain: Follows pain clinic and is on opiate  contract.  Has not been able to tolerate p.o. pain meds.  Feels better after IV medications in the ED.  8.  Hypokalemia: In the setting of GI losses.  Improved from 3.2->3.4.  Will add potassium to fluids.  Labs in a.m.  9.  History of ?  CVA, paroxysmal A. fib: On Eliquis at baseline-daughter and patient unclear about indication.  Report that it was started by Dr. Angelena Form.  Will give Lovenox subcu therapeutic dose until able to tolerate p.o.  10.  Chronic leg edema: Hold Lasix for now.  No significant edema currently.  Patient denies history of heart failure.  DVT prophylaxis: Lovenox  COVID screen: Pending  Code Status: DNR as confirmed with patient in presence of daughter.Health care proxy would be her daughter Betty Jackson  Patient/Family Communication: Discussed with patient and all questions answered to satisfaction.  Consults called: None Admission status :I certify that at the point of admission it is my clinical judgment that the patient will require inpatient hospital care spanning beyond 2 midnights from the point of admission due to high intensity of service and high frequency of surveillance required.Inpatient status is judged to be reasonable and necessary in order to provide the required intensity of service to ensure the patient's safety. The patient's presenting symptoms, physical exam findings, and initial radiographic and laboratory data in the context of their chronic comorbidities is felt to place them at high risk for further clinical deterioration. The following factors support the patient status of inpatient : Acute diverticulitis/AKI requiring IV antibiotics/IV fluids.  Failure of outpatient treatment trial. Expected LOS: 3 to 4 days    Guilford Shi MD [facility] Pager [phone]  If 7PM-7AM, please contact night-coverage [email or web address] Password [email or web address]  01/13/2019, 5:53 PM

## 2019-01-14 DIAGNOSIS — R112 Nausea with vomiting, unspecified: Secondary | ICD-10-CM

## 2019-01-14 DIAGNOSIS — K5792 Diverticulitis of intestine, part unspecified, without perforation or abscess without bleeding: Secondary | ICD-10-CM | POA: Diagnosis not present

## 2019-01-14 DIAGNOSIS — R197 Diarrhea, unspecified: Secondary | ICD-10-CM

## 2019-01-14 LAB — BASIC METABOLIC PANEL
Anion gap: 15 (ref 5–15)
BUN: 12 mg/dL (ref 8–23)
CO2: 20 mmol/L — ABNORMAL LOW (ref 22–32)
Calcium: 8.5 mg/dL — ABNORMAL LOW (ref 8.9–10.3)
Chloride: 105 mmol/L (ref 98–111)
Creatinine, Ser: 1.5 mg/dL — ABNORMAL HIGH (ref 0.44–1.00)
GFR calc Af Amer: 37 mL/min — ABNORMAL LOW (ref >60)
GFR calc non Af Amer: 32 mL/min — ABNORMAL LOW (ref >60)
Glucose, Bld: 84 mg/dL (ref 70–99)
Potassium: 4 mmol/L (ref 3.5–5.1)
Sodium: 140 mmol/L (ref 135–145)

## 2019-01-14 LAB — CBC
HCT: 34.6 % — ABNORMAL LOW (ref 36.0–46.0)
Hemoglobin: 10.9 g/dL — ABNORMAL LOW (ref 12.0–15.0)
MCH: 30.2 pg (ref 26.0–34.0)
MCHC: 31.5 g/dL (ref 30.0–36.0)
MCV: 95.8 fL (ref 80.0–100.0)
Platelets: 146 10*3/uL — ABNORMAL LOW (ref 150–400)
RBC: 3.61 MIL/uL — ABNORMAL LOW (ref 3.87–5.11)
RDW: 13.6 % (ref 11.5–15.5)
WBC: 9.9 10*3/uL (ref 4.0–10.5)
nRBC: 0 % (ref 0.0–0.2)

## 2019-01-14 MED ORDER — LACTATED RINGERS IV SOLN
INTRAVENOUS | Status: AC
  Administered 2019-01-14 – 2019-01-15 (×2): via INTRAVENOUS

## 2019-01-14 MED ORDER — HYDROMORPHONE HCL 1 MG/ML IJ SOLN
0.5000 mg | INTRAMUSCULAR | Status: DC | PRN
  Administered 2019-01-14 – 2019-01-15 (×5): 0.5 mg via INTRAVENOUS
  Filled 2019-01-14 (×5): qty 0.5

## 2019-01-14 MED ORDER — ENOXAPARIN SODIUM 80 MG/0.8ML ~~LOC~~ SOLN
1.0000 mg/kg | SUBCUTANEOUS | Status: DC
  Administered 2019-01-15: 75 mg via SUBCUTANEOUS
  Filled 2019-01-14: qty 0.8

## 2019-01-14 MED ORDER — PREDNISONE 5 MG PO TABS
10.0000 mg | ORAL_TABLET | Freq: Every day | ORAL | Status: DC
  Administered 2019-01-15 – 2019-01-20 (×6): 10 mg via ORAL
  Filled 2019-01-14: qty 1
  Filled 2019-01-14: qty 2
  Filled 2019-01-14 (×2): qty 1
  Filled 2019-01-14 (×2): qty 2

## 2019-01-14 MED ORDER — HYDROCODONE-ACETAMINOPHEN 5-325 MG PO TABS
1.0000 | ORAL_TABLET | ORAL | Status: DC | PRN
  Administered 2019-01-15 – 2019-01-17 (×8): 1 via ORAL
  Administered 2019-01-18: 18:00:00 2 via ORAL
  Administered 2019-01-18 – 2019-01-19 (×6): 1 via ORAL
  Administered 2019-01-19: 2 via ORAL
  Administered 2019-01-20: 1 via ORAL
  Administered 2019-01-20: 17:00:00 2 via ORAL
  Administered 2019-01-20: 1 via ORAL
  Filled 2019-01-14 (×12): qty 1
  Filled 2019-01-14 (×2): qty 2
  Filled 2019-01-14: qty 1
  Filled 2019-01-14: qty 2
  Filled 2019-01-14 (×3): qty 1
  Filled 2019-01-14: qty 2

## 2019-01-14 MED ORDER — HYDROCODONE-ACETAMINOPHEN 5-325 MG PO TABS
1.0000 | ORAL_TABLET | Freq: Once | ORAL | Status: AC
  Administered 2019-01-14: 06:00:00 1 via ORAL
  Filled 2019-01-14: qty 1

## 2019-01-14 MED ORDER — ONDANSETRON HCL 4 MG/2ML IJ SOLN
4.0000 mg | Freq: Four times a day (QID) | INTRAMUSCULAR | Status: DC | PRN
  Administered 2019-01-14 – 2019-01-17 (×6): 4 mg via INTRAVENOUS
  Filled 2019-01-14 (×6): qty 2

## 2019-01-14 MED ORDER — TRAZODONE HCL 50 MG PO TABS
50.0000 mg | ORAL_TABLET | Freq: Once | ORAL | Status: AC
  Administered 2019-01-14: 02:00:00 50 mg via ORAL
  Filled 2019-01-14: qty 1

## 2019-01-14 NOTE — Progress Notes (Signed)
Patient has chronic back pain, currently rated 10 on a scale of 0-10. MD paged and order for morphine given earlier, patient stated that the morphine helped but briefly. MD on call contacted again to get new order, Norco ordered. Informed MD on call that patient has output less than 30 mls x 4hrs. No new orders given. Dawson Bills, RN

## 2019-01-14 NOTE — Progress Notes (Signed)
[facility]  PROGRESS NOTE  Betty Jackson EGB:[id number] DOB: 06-28-1934 DOA: 01/13/2019 PCP: Eulas Post, MD  Brief History   HPI: Betty Jackson is a 84 y.o. female with history h/o CAD status post PTCA, chronic low back pain, CKD stage III, DDD, GERD, CVA, paroxysmal A. fib on Eliquis, rheumatoid arthritis/temporal arteritis on chronic prednisolone, anxiety/depression/chronic insomnia and prior history of diverticulitis presented to the ED on 01/13/19 with complaints of nausea, emesis, and diarrhea.  Initially seen by PCP on 10/14 for abdominal pain, nausea, vomiting and given PPI with instructions to return to ED if symptoms worsen.  Was seen at [facility] ED on 10/15 due to worsening belly pain and poor p.o. intake and was found to have acute diverticulitis on CT abdomen and discharge from ED with prescription for chronic oxycodone in addition to Cipro, Flagyl, Zofran, Bentyl.  Patient presented to our ED with persistent abdominal pain, diminished oral intake and inability to tolerate oral antibiotics  ED Course: T-max 102.2, blood pressure range 104/56-160/61, heart rate 103.  Covid negative.Lab work notable for potassium 3.4, creatinine 1.26 (baseline 0.7-1), AST 44, ALT 51, alk phos 141, T bili 2.7, lipase 34, hemoglobin 11.3.  Chest x-ray with no acute abnormalities.  Patient was given IV fluids, IV morphine, IV Flagyl, IV Levaquin and [facility] for further management of acute diverticulitis   A & P     Persistent nausea, vomiting weakness of acute diverticulitis without abscess or perforation, unable to tolerate oral antibiotics.  CT abdomen on 10/15 with diverticulosis without local complications, abdominal exam is nontoxic appearing continue to monitor abdominal exam, continue IV fluids, IV Levaquin, Flagyl until able to tolerate p.o., as needed antiemetics and IV pain control, start clear liquid diet   SIRS criteria.  Febrile and tachycardic on  admission in setting of acute diverticulitis.  Do not suspect sepsis syndrome,  Likely related to above.  No localizing signs of infection, if recurs will obtain blood cultures.   AKI on CKD stage III, suspect prerenal etiology in the setting of diminished oral intake, nausea/emesis, slightly worse up to 1.56.  Holding home losartan/Lasix continue LR infusion, encourage oral intake once able to tolerate without persistent nausea/vomiting.  Serial BMP, avoid nephrotoxins, monitor output   Hypokalemia in setting of diarrhea, stable.  Discontinue potassium stabilization, serial BMP monitoring.   Questionable history of CVA, paroxysmal atrial fibrillation.  Daughter unclear why Eliquis, currently on Lovenox subcu once able to tolerate p.o.   Hypertension.  Home losartan, Lopressor on hold once able to tolerate p.o., on IV Lopressor to avoid reflex tachycardia   Rheumatoid arthritis/temporal arteritis on chronic prednisone, resuming home prednisone 10 mg   Anxiety, stable.  Daughter reports patient uses benzodiazepine sparing.  Will hold off on further sedating medications on IV Dilaudid   Depression, holding home Lexapro until able to tolerate p.o.   Chronic low back pain.  On home Bentyl, Vicodin, oxycodone IR   Chronic normocytic anemia, hemoglobin stable at baseline.  No active signs of bleeding.  Closely monitor.   GERD, stable.  Home Protonix on hold until able to tolerate p.o.     DVT prophylaxis: Lovenox Code Status: DNR confirmed on day of admission Family Communication: Daughter updated at bedside Disposition Plan: Continue inpatient stay needing IV antibiotics, slowly advancing diet, serial abdominal exam, monitor kidney function, vitals     [facility] Direct contact: see [email or web address] (further directions at bottom of note if needed) 7PM-7AM contact night  coverage as at bottom of note 01/14/2019, 8:09 AM  LOS: 1 day   Consultants   None  Procedures    None  Antibiotics   IV Cipro Flagyl  Interval History/Subjective  Ready to try diet No vomiting this morning Still has belly pain but improved  Objective   Vitals:  Vitals:   01/14/19 0121 01/14/19 0519  BP: (!) 129/55 (!) 140/54  Pulse: 91 86  Resp: 18 16  Temp: 98.7 F (37.1 C) 98.2 F (36.8 C)  SpO2: 98% 96%    Exam: Sleepy but easily arousable, alert, no distress Tachycardic, regular rhythm Normal respiratory effort on room air Abdomen soft, nondistended, diminished bowel sounds, no rebound tenderness or guarding No peripheral edema    I have personally reviewed the following:   Data Reviewed: Basic Metabolic Panel: Recent Labs  Lab 01/12/19 1136 01/13/19 1230 01/14/19 0325  NA 139 139 140  K 3.0* 3.4* 4.0  CL 97* 100 105  CO2 29 26 20*  GLUCOSE 102* 106* 84  BUN _0 CREATININE 0.73 1.26* 1.50*  CALCIUM 8.9 8.7* 8.5*   Liver Function Tests: Recent Labs  Lab 01/12/19 1136 01/13/19 1230  AST 16 44*  ALT 14 51*  ALKPHOS 77 141*  BILITOT 2.0* 2.7*  PROT 6.8 6.7  ALBUMIN 3.7 3.4*   Recent Labs  Lab 01/12/19 1136 01/13/19 1230  LIPASE 30 34   No results for input(s): AMMONIA in the last 168 hours. CBC: Recent Labs  Lab 01/12/19 1136 01/13/19 1230 01/14/19 0325  WBC 11.9* 10.4 9.9  NEUTROABS 8.5* 7.6  --   HGB 12.3 11.3* 10.9*  HCT 38.5 36.2 34.6*  MCV 94.6 95.3 95.8  PLT 175 194 146*   Cardiac Enzymes: No results for input(s): CKTOTAL, CKMB, CKMBINDEX, TROPONINI in the last 168 hours. BNP (last 3 results) No results for input(s): BNP in the last 8760 hours.  ProBNP (last 3 results) No results for input(s): PROBNP in the last 8760 hours.  CBG: No results for input(s): GLUCAP in the last 168 hours.  Recent Results (from the past 240 hour(s))  SARS CORONAVIRUS 2 (TAT 6-24 HRS) Nasopharyngeal Nasopharyngeal Swab     Status: None   Collection Time: 01/13/19  2:01 PM   Specimen: Nasopharyngeal Swab  Result Value Ref  Range Status   SARS Coronavirus 2 NEGATIVE NEGATIVE Final    Comment: (NOTE) SARS-CoV-2 target nucleic acids are NOT DETECTED. The SARS-CoV-2 RNA is generally detectable in upper and lower respiratory specimens during the acute phase of infection. Negative results do not preclude SARS-CoV-2 infection, do not rule out co-infections with other pathogens, and should not be used as the sole basis for treatment or other patient management decisions. Negative results must be combined with clinical observations, patient history, and epidemiological information. The expected result is Negative. Fact Sheet for Patients: SugarRoll.be Fact Sheet for Healthcare Providers: https://www.woods-mathews.com/ This test is not yet approved or cleared by the [place] FDA and  has been authorized for detection and/or diagnosis of SARS-CoV-2 by FDA under an Emergency Use Authorization (EUA). This EUA will remain  in effect (meaning this test can be used) for the duration of the COVID-19 declaration under Section 56 4(b)(1) of the Act, 21 U.S.C. section 360bbb-3(b)(1), unless the authorization is terminated or revoked sooner. Performed at [facility], [place] [address] [place], [place] [address]      Studies: Ct Abdomen Pelvis W Contrast  Result Date: 01/12/2019 CLINICAL DATA:  Mid  low back pain and low abd pain x3days. EXAM: CT ABDOMEN AND PELVIS WITH CONTRAST TECHNIQUE: Multidetector CT imaging of the abdomen and pelvis was performed using the standard protocol following bolus administration of intravenous contrast. CONTRAST:  114m OMNIPAQUE IOHEXOL 300 MG/ML  SOLN COMPARISON:  05/20/2017 FINDINGS: Lower chest: There is scarring within the RIGHT middle lobe and lung bases. Heart size is normal. Hepatobiliary: Scattered calcified nodules are identified within the liver, consistent with remote granulomatous disease. Focal fatty infiltration identified  adjacent to the falciform ligament. Status post cholecystectomy. There is prominence of the common bile duct, measuring 11 millimeters, likely secondary to cholecystectomy. Pancreas: Unremarkable. No pancreatic ductal dilatation or surrounding inflammatory changes. Spleen: Normal in size without focal abnormality. Adrenals/Urinary Tract: Adrenal glands are normal in appearance. Subcentimeter low-attenuation lesions within both kidneys are likely cysts. No hydronephrosis. Courses of the ureters are partially obscured by metallic artifact in the pelvis. The bladder and visualized portion of the urethra are normal. Stomach/Bowel: Small hiatal hernia normal. Stomach is normal in appearance. Small bowel loops are normal caliber. The appendix is well seen and has a normal appearance. There are numerous colonic diverticula. There is circumferential thickening of the proximal sigmoid segment, associated with sigmoid mesenteric edema. Numerous diverticular are identified in this region and findings are consistent with acute diverticulitis. No evidence for perforation. No abscess. Vascular/Lymphatic: There is dense atherosclerotic calcification of the abdominal aorta. Although involved by atherosclerosis, there is vascular opacification of the celiac axis, superior mesenteric artery, and inferior mesenteric artery. Normal appearance of the portal venous system and inferior vena cava. No retroperitoneal or mesenteric adenopathy. Reproductive: Prior hysterectomy. No adnexal mass. Other: Small fat containing paraumbilical hernia. No ascites. Musculoskeletal: RIGHT hip arthroplasty and associated significant artifact in the pelvis. Status post posterior fusion with interbody fusion devices at L2-L4. Prior vertebral augmentation at T11 and L1. Significant degenerative changes throughout the lumbar spine. IMPRESSION: 1. Findings are consistent with acute sigmoid diverticulitis. No evidence for perforation or abscess. 2. Status post  cholecystectomy and hysterectomy. 3. Small hiatal hernia. 4. Small fat containing paraumbilical hernia. 5. Postoperative and degenerative changes in the lumbar spine. 6. Aortic Atherosclerosis (ICD10-I70.0). Electronically Signed   By: ENolon NationsM.D.   On: 01/12/2019 14:39   Dg Chest Port 1 View  Result Date: 01/13/2019 CLINICAL DATA:  Dyspnea. EXAM: PORTABLE CHEST 1 VIEW COMPARISON:  11/25/2016 FINDINGS: Normal heart size. Lungs are hypoinflated. No pleural effusion identified. Linear scar like densities versus parenchymal banding identified within the left midlung and left base. No airspace opacities identified. IMPRESSION: 1. No acute cardiopulmonary abnormalities. 2. Linear scar like densities in the left midlung and left base. Electronically Signed   By: TKerby MoorsM.D.   On: 01/13/2019 17:10    Scheduled Meds:  enoxaparin (LOVENOX) injection  1 mg/kg Subcutaneous Q12H   lidocaine  1 patch Transdermal Q24H   metoprolol tartrate  5 mg Intravenous Q6H   traMADol  50 mg Oral Once   Continuous Infusions:  lactated ringers with kcl 100 mL/hr at 01/14/19 0801   [START ON 01/15/2019] levofloxacin (LEVAQUIN) IV     metronidazole 500 mg (01/14/19 0541)    Principal Problem:   Acute diverticulitis Active Problems:   Temporal arteritis (HCC)   Lumbar stenosis   PAF (paroxysmal atrial fibrillation) (HCC)   AKI (acute kidney injury) ([place]   Chronic pain   Tachycardia   Hypokalemia      SDesiree Hane [facility]

## 2019-01-15 LAB — CBC
HCT: 31.3 % — ABNORMAL LOW (ref 36.0–46.0)
Hemoglobin: 9.6 g/dL — ABNORMAL LOW (ref 12.0–15.0)
MCH: 29.5 pg (ref 26.0–34.0)
MCHC: 30.7 g/dL (ref 30.0–36.0)
MCV: 96.3 fL (ref 80.0–100.0)
Platelets: 162 10*3/uL (ref 150–400)
RBC: 3.25 MIL/uL — ABNORMAL LOW (ref 3.87–5.11)
RDW: 13.4 % (ref 11.5–15.5)
WBC: 10.5 10*3/uL (ref 4.0–10.5)
nRBC: 0 % (ref 0.0–0.2)

## 2019-01-15 LAB — BASIC METABOLIC PANEL
Anion gap: 13 (ref 5–15)
BUN: 7 mg/dL — ABNORMAL LOW (ref 8–23)
CO2: 20 mmol/L — ABNORMAL LOW (ref 22–32)
Calcium: 8.9 mg/dL (ref 8.9–10.3)
Chloride: 109 mmol/L (ref 98–111)
Creatinine, Ser: 1.1 mg/dL — ABNORMAL HIGH (ref 0.44–1.00)
GFR calc Af Amer: 54 mL/min — ABNORMAL LOW (ref >60)
GFR calc non Af Amer: 47 mL/min — ABNORMAL LOW (ref >60)
Glucose, Bld: 114 mg/dL — ABNORMAL HIGH (ref 70–99)
Potassium: 3.8 mmol/L (ref 3.5–5.1)
Sodium: 142 mmol/L (ref 135–145)

## 2019-01-15 MED ORDER — ESCITALOPRAM OXALATE 10 MG PO TABS
10.0000 mg | ORAL_TABLET | Freq: Every day | ORAL | Status: DC
  Administered 2019-01-15 – 2019-01-20 (×6): 10 mg via ORAL
  Filled 2019-01-15 (×7): qty 1

## 2019-01-15 MED ORDER — BOOST / RESOURCE BREEZE PO LIQD CUSTOM
1.0000 | Freq: Three times a day (TID) | ORAL | Status: DC
  Administered 2019-01-15 – 2019-01-20 (×9): 1 via ORAL

## 2019-01-15 MED ORDER — APIXABAN 5 MG PO TABS
5.0000 mg | ORAL_TABLET | Freq: Two times a day (BID) | ORAL | Status: DC
  Administered 2019-01-15 – 2019-01-20 (×10): 5 mg via ORAL
  Filled 2019-01-15 (×10): qty 1

## 2019-01-15 MED ORDER — MORPHINE SULFATE (PF) 2 MG/ML IV SOLN: 2.0000 mg | INTRAVENOUS | Status: DC | PRN

## 2019-01-15 MED ORDER — METOPROLOL TARTRATE 25 MG PO TABS
25.0000 mg | ORAL_TABLET | Freq: Two times a day (BID) | ORAL | Status: DC
  Administered 2019-01-15 – 2019-01-18 (×7): 25 mg via ORAL
  Filled 2019-01-15 (×8): qty 1

## 2019-01-15 MED ORDER — ENOXAPARIN SODIUM 80 MG/0.8ML ~~LOC~~ SOLN: 1.0000 mg/kg | Freq: Two times a day (BID) | SUBCUTANEOUS | Status: DC

## 2019-01-15 NOTE — Plan of Care (Signed)
  Problem: Clinical Measurements: Goal: Respiratory complications will improve Outcome: Progressing   Problem: Clinical Measurements: Goal: Cardiovascular complication will be avoided Outcome: Progressing   Problem: Coping: Goal: Level of anxiety will decrease Outcome: Progressing   Problem: Elimination: Goal: Will not experience complications related to bowel motility Outcome: Progressing   Problem: Safety: Goal: Ability to remain free from injury will improve Outcome: Progressing   Problem: Skin Integrity: Goal: Risk for impaired skin integrity will decrease Outcome: Progressing

## 2019-01-15 NOTE — Progress Notes (Signed)
Dr. Lonny Prude notified of pt temp of 101. Pt given Tylenol. Pt continues to be sleepy but easily awakened. Pt has woken up enough to take in two full glasses of orange juice. rn will continue to monitor.

## 2019-01-15 NOTE — Progress Notes (Signed)
Pt with episode of nausea and vomiting. Zofran given and pt encouraged to rest.

## 2019-01-15 NOTE — Progress Notes (Signed)
Pt remains sleepy though staff is able to wake pt up easily. RN did mention to Md about concern that pt is getting too much pain meds and pain medications were adjusted. No needs at this time. Pt stable with no signs of distress. Rn will continue to monitor.

## 2019-01-15 NOTE — Progress Notes (Signed)
[facility]  PROGRESS NOTE  TIWANDA THREATS MMC:[id number] DOB: 05/29/1934 DOA: 01/13/2019 PCP: Eulas Post, MD  Brief History   HPI: LIZBET CIRRINCIONE is a 84 y.o. female with history h/o CAD status post PTCA, chronic low back pain, CKD stage III, DDD, GERD, CVA, paroxysmal A. fib on Eliquis, rheumatoid arthritis/temporal arteritis on chronic prednisolone, anxiety/depression/chronic insomnia and prior history of diverticulitis presented to the ED on 01/13/19 with complaints of nausea, emesis, and diarrhea.  Initially seen by PCP on 10/14 for abdominal pain, nausea, vomiting and given PPI with instructions to return to ED if symptoms worsen.  Was seen at [facility] ED on 10/15 due to worsening belly pain and poor p.o. intake and was found to have acute diverticulitis on CT abdomen and discharge from ED with prescription for chronic oxycodone in addition to Cipro, Flagyl, Zofran, Bentyl.  Patient presented to our ED with persistent abdominal pain, diminished oral intake and inability to tolerate oral antibiotics  ED Course: T-max 102.2, blood pressure range 104/56-160/61, heart rate 103.  Covid negative.Lab work notable for potassium 3.4, creatinine 1.26 (baseline 0.7-1), AST 44, ALT 51, alk phos 141, T bili 2.7, lipase 34, hemoglobin 11.3.  Chest x-ray with no acute abnormalities.  Patient was given IV fluids, IV morphine, IV Flagyl, IV Levaquin and [facility] for further management of acute diverticulitis.  CT abdomen here showed diverticulitis without local complications.  Has been medically managed with IV fluids, IV Levaquin, IV Flagyl still able to tolerate p.o. diet   A & P     Persistent nausea, vomiting weakness of acute diverticulitis without abscess or perforation, unable to tolerate oral antibiotics as outpatient.  Nontoxic abdominal exam, abdominal pain well controlled, tolerating diet, advance to regular diet.  If does okay will transition to oral  antibiotics next 24 hours.  Have decreased IV pain medicine to decrease somnolence.    SIRS criteria.  Febrile and tachycardic on admission in setting of acute diverticulitis.  Do not suspect sepsis syndrome,  Likely related to above.  No localizing signs of infection, fever is downtrending, monitor blood cultures   AKI on CKD stage III, suspect prerenal etiology in the setting of diminished oral intake, nausea/emesis, improving.  Holding home losartan/Lasix.  Will monitor on oral diet, discontinue infusion  Serial BMP, avoid nephrotoxins, monitor output   Hypokalemia in setting of diarrhea, resolved.  No longer vomiting, replete as necessary, serial BMP monitoring.   Paroxysmal atrial fibrillation.  Confirmed history in cardiology outpt notes, currently on Lovenox subcu, now tolerating oral intake will resume home Eliquis   Hypertension, SBP elevated 150s-160s overnight while holding home losartan and Lopressor.  Resume home Lopressor, continue to hold losartan until kidney function returns to baseline.   Rheumatoid arthritis/temporal arteritis on chronic prednisone, resuming home prednisone 10 mg   Anxiety, stable.  Daughter reports patient uses benzodiazepine sparingly.  Will hold off on further sedating medications on IV Dilaudid   Depression, holding home Lexapro until able to tolerate p.o.   Chronic low back pain.  On home Bentyl, Vicodin, oxycodone IR   Chronic normocytic anemia, hemoglobin stable at baseline.  No active signs of bleeding.  Closely monitor.   GERD, stable.  Home Protonix on hold until able to tolerate p.o.     DVT prophylaxis: Lovenox Code Status: DNR confirmed on day of admission Family Communication: Daughter updated at bedside Disposition Plan: Continue inpatient stay needing IV antibiotics, monitor abdominal pain on oral diet, serial  abdominal exam, monitor kidney function, vitals     [facility] Direct contact: see [email or web address] (further  directions at bottom of note if needed) 7PM-7AM contact night coverage as at bottom of note 01/15/2019, 1:01 PM  LOS: 2 days   Consultants  . None  Procedures  . None  Antibiotics  . IV Cipro Flagyl  Interval History/Subjective  No vomiting overnight Still pretty sleepy this morning States belly pain is improving  Objective   Vitals:  Vitals:   01/15/19 0030 01/15/19 0616  BP: (!) 162/60 (!) 164/64  Pulse: (!) 104 94  Resp:  18  Temp: 98.1 F (36.7 C) 100 F (37.8 C)  SpO2: 99% 99%    Exam: Sleepy but easily arousable, alert, no distress Tachycardic, regular rhythm Normal respiratory effort on room air Abdomen soft, nondistended, diminished bowel sounds, no rebound tenderness or guarding No peripheral edema    I have personally reviewed the following:   Data Reviewed: Basic Metabolic Panel: Recent Labs  Lab 01/12/19 1136 01/13/19 1230 01/14/19 0325 01/15/19 0006  NA 139 139 140 142  K 3.0* 3.4* 4.0 3.8  CL 97* 100 105 109  CO2 29 26 20* 20*  GLUCOSE 102* 106* 84 114*  BUN '11 12 12 ' 7*  CREATININE 0.73 1.26* 1.50* 1.10*  CALCIUM 8.9 8.7* 8.5* 8.9   Liver Function Tests: Recent Labs  Lab 01/12/19 1136 01/13/19 1230  AST 16 44*  ALT 14 51*  ALKPHOS 77 141*  BILITOT 2.0* 2.7*  PROT 6.8 6.7  ALBUMIN 3.7 3.4*   Recent Labs  Lab 01/12/19 1136 01/13/19 1230  LIPASE 30 34   No results for input(s): AMMONIA in the last 168 hours. CBC: Recent Labs  Lab 01/12/19 1136 01/13/19 1230 01/14/19 0325 01/15/19 0006  WBC 11.9* 10.4 9.9 10.5  NEUTROABS 8.5* 7.6  --   --   HGB 12.3 11.3* 10.9* 9.6*  HCT 38.5 36.2 34.6* 31.3*  MCV 94.6 95.3 95.8 96.3  PLT 175 194 146* 162   Cardiac Enzymes: No results for input(s): CKTOTAL, CKMB, CKMBINDEX, TROPONINI in the last 168 hours. BNP (last 3 results) No results for input(s): BNP in the last 8760 hours.  ProBNP (last 3 results) No results for input(s): PROBNP in the last 8760 hours.  CBG: No  results for input(s): GLUCAP in the last 168 hours.  Recent Results (from the past 240 hour(s))  SARS CORONAVIRUS 2 (TAT 6-24 HRS) Nasopharyngeal Nasopharyngeal Swab     Status: None   Collection Time: 01/13/19  2:01 PM   Specimen: Nasopharyngeal Swab  Result Value Ref Range Status   SARS Coronavirus 2 NEGATIVE NEGATIVE Final    Comment: (NOTE) SARS-CoV-2 target nucleic acids are NOT DETECTED. The SARS-CoV-2 RNA is generally detectable in upper and lower respiratory specimens during the acute phase of infection. Negative results do not preclude SARS-CoV-2 infection, do not rule out co-infections with other pathogens, and should not be used as the sole basis for treatment or other patient management decisions. Negative results must be combined with clinical observations, patient history, and epidemiological information. The expected result is Negative. Fact Sheet for Patients: SugarRoll.be Fact Sheet for Healthcare Providers: https://www.woods-mathews.com/ This test is not yet approved or cleared by the [place] FDA and  has been authorized for detection and/or diagnosis of SARS-CoV-2 by FDA under an Emergency Use Authorization (EUA). This EUA will remain  in effect (meaning this test can be used) for the duration of the COVID-19 declaration under Section 56 4(b)(1)  of the Act, 21 U.S.C. section 360bbb-3(b)(1), unless the authorization is terminated or revoked sooner. Performed at [facility], [place] [address], [place], [place] [address]      Studies: Dg Chest Port 1 View  Result Date: 01/13/2019 CLINICAL DATA:  Dyspnea. EXAM: PORTABLE CHEST 1 VIEW COMPARISON:  11/25/2016 FINDINGS: Normal heart size. Lungs are hypoinflated. No pleural effusion identified. Linear scar like densities versus parenchymal banding identified within the left midlung and left base. No airspace opacities identified. IMPRESSION: 1. No acute cardiopulmonary  abnormalities. 2. Linear scar like densities in the left midlung and left base. Electronically Signed   By: Kerby Moors M.D.   On: 01/13/2019 17:10    Scheduled Meds: . enoxaparin (LOVENOX) injection  1 mg/kg Subcutaneous Q12H  . lidocaine  1 patch Transdermal Q24H  . metoprolol tartrate  5 mg Intravenous Q6H  . predniSONE  10 mg Oral Q breakfast  . traMADol  50 mg Oral Once   Continuous Infusions: . levofloxacin (LEVAQUIN) IV    . metronidazole 500 mg (01/15/19 1259)    Principal Problem:   Acute diverticulitis Active Problems:   Temporal arteritis (HCC)   Lumbar stenosis   PAF (paroxysmal atrial fibrillation) (HCC)   AKI (acute kidney injury) ([place])   Chronic pain   Tachycardia   Hypokalemia      Desiree Hane  [facility]

## 2019-01-16 DIAGNOSIS — M48061 Spinal stenosis, lumbar region without neurogenic claudication: Secondary | ICD-10-CM

## 2019-01-16 DIAGNOSIS — K5792 Diverticulitis of intestine, part unspecified, without perforation or abscess without bleeding: Secondary | ICD-10-CM | POA: Diagnosis not present

## 2019-01-16 LAB — CBC
HCT: 33 % — ABNORMAL LOW (ref 36.0–46.0)
Hemoglobin: 10.3 g/dL — ABNORMAL LOW (ref 12.0–15.0)
MCH: 29.8 pg (ref 26.0–34.0)
MCHC: 31.2 g/dL (ref 30.0–36.0)
MCV: 95.4 fL (ref 80.0–100.0)
Platelets: 176 10*3/uL (ref 150–400)
RBC: 3.46 MIL/uL — ABNORMAL LOW (ref 3.87–5.11)
RDW: 13.2 % (ref 11.5–15.5)
WBC: 15.5 10*3/uL — ABNORMAL HIGH (ref 4.0–10.5)
nRBC: 0 % (ref 0.0–0.2)

## 2019-01-16 LAB — BASIC METABOLIC PANEL
Anion gap: 9 (ref 5–15)
BUN: 7 mg/dL — ABNORMAL LOW (ref 8–23)
CO2: 23 mmol/L (ref 22–32)
Calcium: 8.8 mg/dL — ABNORMAL LOW (ref 8.9–10.3)
Chloride: 107 mmol/L (ref 98–111)
Creatinine, Ser: 0.77 mg/dL (ref 0.44–1.00)
GFR calc Af Amer: >60 mL/min (ref >60)
GFR calc non Af Amer: >60 mL/min (ref >60)
Glucose, Bld: 147 mg/dL — ABNORMAL HIGH (ref 70–99)
Potassium: 3.5 mmol/L (ref 3.5–5.1)
Sodium: 139 mmol/L (ref 135–145)

## 2019-01-16 MED ORDER — METRONIDAZOLE 500 MG PO TABS
500.0000 mg | ORAL_TABLET | Freq: Three times a day (TID) | ORAL | Status: AC
  Administered 2019-01-16 – 2019-01-19 (×10): 500 mg via ORAL
  Filled 2019-01-16 (×11): qty 1

## 2019-01-16 MED ORDER — CIPROFLOXACIN HCL 500 MG PO TABS
500.0000 mg | ORAL_TABLET | Freq: Two times a day (BID) | ORAL | Status: DC
  Administered 2019-01-16 – 2019-01-20 (×8): 500 mg via ORAL
  Filled 2019-01-16 (×8): qty 1

## 2019-01-16 MED ORDER — SODIUM CHLORIDE 0.9 % IV SOLN
INTRAVENOUS | Status: DC | PRN
  Administered 2019-01-16: 14:00:00 250 mL via INTRAVENOUS
  Administered 2019-01-18: 1000 mL via INTRAVENOUS

## 2019-01-16 NOTE — Evaluation (Signed)
Physical Therapy Evaluation Patient Details Name: Betty Jackson MRN: [id number] DOB: 1934/05/17 Today's Date: 01/16/2019   History of Present Illness  84 yo female admitted with diverticulitis. Hx of RA, A fib, CAD, LBP, scoliosis, LE edema, DDD, CVA, CKD.  Clinical Impression  On eval, pt required Min-Mod assist for mobility. She walked ~25 feet with use of a rollator. Pt presents with general weakness, decreased activity tolerance, and impaired gait and balance. Pt c/o 9/10 pain in her back and neck. She also c/o numbness in both feet. Discussed d/c plan with pt and daughter. Both pt and daughter state plan is for d/c home. Daughter stated she can stay with pt if necessary. Pt's husband is unable to provide any physical assistance. Will follow and progress activity as tolerated.     Follow Up Recommendations Home health PT;Supervision/Assistance - 24 hour; Home Health Aide  (pt and family prefer home. daughter stated she can stay with pt)    Equipment Recommendations  3in1 (PT)    Recommendations for Other Services       Precautions / Restrictions Precautions Precautions: Fall Restrictions Weight Bearing Restrictions: No      Mobility  Bed Mobility Overal bed mobility: Needs Assistance Bed Mobility: Supine to Sit;Sit to Supine     Supine to sit: Mod assist;HOB elevated Sit to supine: Min assist;HOB elevated   General bed mobility comments: Assist for trunk and LEs. Increased time. Pt relied on rails  Transfers Overall transfer level: Needs assistance Equipment used: 4-wheeled walker Transfers: Sit to/from Stand Sit to Stand: Min guard         General transfer comment: Increased time. Cues for hand placement, safety. Close guard for safety.  Ambulation/Gait Ambulation/Gait assistance: Min assist Gait Distance (Feet): 25 Feet Assistive device: 4-wheeled walker Gait Pattern/deviations: Step-through pattern;Decreased stride length     General Gait Details: Assist  to stabilize pt throughout short distance. Cues for safety, proper use of RW. Unsteady. Unsafe at times.  Stairs            Wheelchair Mobility    Modified Rankin (Stroke Patients Only)       Balance Overall balance assessment: Needs assistance         Standing balance support: Bilateral upper extremity supported Standing balance-[name]: Poor                               Pertinent Vitals/Pain Pain Assessment: 0-10 Pain Score: 9  Pain Location: back, neck Pain Descriptors / Indicators: Discomfort;Aching;Sore;Guarding Pain Intervention(s): Limited activity within patient's tolerance;Repositioned    Home Living Family/patient expects to be discharged to:: Private residence Living Arrangements: Spouse/significant other Available Help at Discharge: Family;Available PRN/intermittently(husband cannot assist. daughter(s) can) Type of Home: House Home Access: Level entry     Home Layout: One level Home Equipment: Walker - 4 wheels;Walker - 2 wheels      Prior Function Level of Independence: Needs assistance   Gait / Transfers Assistance Needed: uses rollator  ADL's / Homemaking Assistance Needed: able to perform unassisted up until ~1 week PTA        Hand Dominance        Extremity/Trunk Assessment   Upper Extremity Assessment Upper Extremity Assessment: Generalized weakness    Lower Extremity Assessment Lower Extremity Assessment: Generalized weakness(pt c/o numbness in bil feet)    Cervical / Trunk Assessment Cervical / Trunk Assessment: Kyphotic  Communication   Communication: No difficulties  Cognition  Arousal/Alertness: Awake/alert Behavior During Therapy: WFL for tasks assessed/performed Overall Cognitive Status: Within Functional Limits for tasks assessed                                        General Comments      Exercises     Assessment/Plan    PT Assessment Patient needs continued PT services   PT Problem List Decreased strength;Decreased mobility;Decreased safety awareness;Decreased activity tolerance;Decreased balance;Decreased knowledge of use of DME;Pain       PT Treatment Interventions DME instruction;Gait training;Therapeutic exercise;Therapeutic activities;Patient/family education;Balance training;Functional mobility training    PT Goals (Current goals can be found in the Care Plan section)  Acute Rehab PT Goals Patient Stated Goal: home PT Goal Formulation: With patient/family Time For Goal Achievement: 01/30/19 Potential to Achieve Goals: Good    Frequency Min 3X/week   Barriers to discharge        Co-evaluation               AM-PAC PT "6 Clicks" Mobility  Outcome Measure Help needed turning from your back to your side while in a flat bed without using bedrails?: A Little Help needed moving from lying on your back to sitting on the side of a flat bed without using bedrails?: A Lot Help needed moving to and from a bed to a chair (including a wheelchair)?: A Little Help needed standing up from a chair using your arms (e.g., wheelchair or bedside chair)?: A Little Help needed to walk in hospital room?: A Little Help needed climbing 3-5 steps with a railing? : A Lot 6 Click Score: 16    End of Session   Activity Tolerance: Patient limited by fatigue;Patient limited by pain Patient left: in bed;with call bell/phone within reach;with bed alarm set;with family/visitor present   PT Visit Diagnosis: Unsteadiness on feet (R26.81);Muscle weakness (generalized) (M62.81);Pain;Difficulty in walking, not elsewhere classified (R26.2) Pain - part of body: (back, neck)    Time: [id number] PT Time Calculation (min) (ACUTE ONLY): 29 min   Charges:   PT Evaluation $PT Eval Moderate Complexity: 1 Mod PT Treatments $Gait Training: 8-22 mins          Weston Anna, PT Acute Rehabilitation Services Pager: [phone] Office: [phone]

## 2019-01-16 NOTE — Care Management Important Message (Signed)
Important Message  Patient Details IM Letter given to Velva Harman RN to present to the Patient Name: Betty Jackson MRN: [id number] Date of Birth: 1934-04-13   Medicare Important Message Given:  Yes     Kerin Salen 01/16/2019, 10:20 AM

## 2019-01-16 NOTE — Progress Notes (Signed)
[facility]  PROGRESS NOTE  Betty Jackson DOB: 06/03/1934 DOA: 01/13/2019 PCP: Eulas Post, MD  Brief History   HPI: Betty Jackson is a 84 y.o. female with history h/o CAD status post PTCA, chronic low back pain, CKD stage III, DDD, GERD, CVA, paroxysmal A. fib on Eliquis, rheumatoid arthritis/temporal arteritis on chronic prednisolone, anxiety/depression/chronic insomnia and prior history of diverticulitis presented to the ED on 01/13/19 with complaints of nausea, emesis, and diarrhea.  Initially seen by PCP on 10/14 for abdominal pain, nausea, vomiting and given PPI with instructions to return to ED if symptoms worsen.  Was seen at [facility] ED on 10/15 due to worsening belly pain and poor p.o. intake and was found to have acute diverticulitis on CT abdomen and discharge from ED with prescription for chronic oxycodone in addition to Cipro, Flagyl, Zofran, Bentyl.  Patient presented to our ED with persistent abdominal pain, diminished oral intake and inability to tolerate oral antibiotics  ED Course: T-max 102.2, blood pressure range 104/56-160/61, heart rate 103.  Covid negative.Lab work notable for potassium 3.4, creatinine 1.26 (baseline 0.7-1), AST 44, ALT 51, alk phos 141, T bili 2.7, lipase 34, hemoglobin 11.3.  Chest x-ray with no acute abnormalities.  Patient was given IV fluids, IV morphine, IV Flagyl, IV Levaquin and admitted [facility] for further management of acute diverticulitis.  CT abdomen here showed diverticulitis without local complications.  Has been medically managed with IV fluids, IV Levaquin, IV Flagyl still able to tolerate p.o. diet   A & P     Persistent nausea, vomiting weakness of acute diverticulitis without abscess or perforation, unable to tolerate oral antibiotics as outpatient, improving.  Tolerating regular diet, decreased frequency of emesis, fever curve downtrending, will transition to oral antibiotics and monitor  closely over next 24 hours, encourage use of oral pain medication and IV for breakthrough pain control     SIRS criteria.  Last fever in afternoon on 10/18.  Tachycardia has resolved.  Sepsis physiology has resolved.  Blood cultures remain unremarkable   AKI on CKD stage III, suspect prerenal etiology in the setting of diminished oral intake, nausea/emesis, improving.  Holding home losartan/Lasix.  Creatinine returned to baseline no longer requiring IV fluids, tolerating diet.  Serial BMP, avoid nephrotoxins, monitor output   Hypokalemia in setting of diarrhea, resolved.  No longer vomiting, replete as necessary, serial BMP monitoring.   Concern for dysphagia.  Denies any prior history.  Seem to tolerate eating diet this morning, will have speech evaluate formally.   Neck pain.  Reproducible on palpation.  Seem consistent with musculoskeletal.  At warm compresses, closely monitor.   Paroxysmal atrial fibrillation.  Confirmed history in cardiology outpt notes, home Eliquis   Hypertension, currently at goal.  Continue home Lopressor  continue to hold losartan until kidney function returns to baseline.   Rheumatoid arthritis/temporal arteritis on chronic prednisone,  home prednisone 10 mg   Anxiety, stable.  Daughter reports patient uses benzodiazepine sparingly.  Will hold off on further sedating medications on IV Dilaudid   Depression,  home Lexapro .   Chronic low back pain.  No new neurologic deficits. On home Bentyl, Vicodin, oxycodone IR   Chronic normocytic anemia, hemoglobin stable at baseline.  No active signs of bleeding.  Closely monitor.   GERD, stable.  Home Protonix on hold until able to tolerate p.o.     DVT prophylaxis: Eliquis Code Status: DNR confirmed on day of admission Family Communication:  Spoke with daughter Betty Jackson on 10/19 and update don plan Disposition Plan: Monitor abdominal exam/clinical status on oral antibiotics, anticipate discharge next 24  hours     [facility] Direct contact: see [email or web address] (further directions at bottom of note if needed) 7PM-7AM contact night coverage as at bottom of note 01/16/2019, 2:41 PM  LOS: 3 days   Consultants  . None  Procedures  . None  Antibiotics  . IV Cipro Flagyl, admission date-10/19  Interval History/Subjective  1 episode of vomiting overnight Feels belly pain is significantly improved Tolerated breakfast this morning  Objective   Vitals:  Vitals:   01/16/19 0536 01/16/19 1403  BP: 136/67 (!) 144/78  Pulse: 96 91  Resp: 17 17  Temp: 97.8 F (36.6 C) 98 F (36.7 C)  SpO2: 98% 94%    Exam: Sleepy but easily arousable, lying in bed alert, no distress Regular rate, irregularly irregular rhythm, no appreciable murmurs, no edema Normal respiratory effort on room air Abdomen soft, nondistended, diminished bowel sounds, no rebound tenderness or guarding Reproducible right-sided neck pain    I have personally reviewed the following:   Data Reviewed: Basic Metabolic Panel: Recent Labs  Lab 01/12/19 1136 01/13/19 1230 01/14/19 0325 01/15/19 0006 01/16/19 0250  NA 139 139 140 142 139  K 3.0* 3.4* 4.0 3.8 3.5  CL 97* 100 105 109 107  CO2 29 26 20* 20* 23  GLUCOSE 102* 106* 84 114* 147*  BUN '11 12 12 ' 7* 7*  CREATININE 0.73 1.26* 1.50* 1.10* 0.77  CALCIUM 8.9 8.7* 8.5* 8.9 8.8*   Liver Function Tests: Recent Labs  Lab 01/12/19 1136 01/13/19 1230  AST 16 44*  ALT 14 51*  ALKPHOS 77 141*  BILITOT 2.0* 2.7*  PROT 6.8 6.7  ALBUMIN 3.7 3.4*   Recent Labs  Lab 01/12/19 1136 01/13/19 1230  LIPASE 30 34   No results for input(s): AMMONIA in the last 168 hours. CBC: Recent Labs  Lab 01/12/19 1136 01/13/19 1230 01/14/19 0325 01/15/19 0006 01/16/19 0250  WBC 11.9* 10.4 9.9 10.5 15.5*  NEUTROABS 8.5* 7.6  --   --   --   HGB 12.3 11.3* 10.9* 9.6* 10.3*  HCT 38.5 36.2 34.6* 31.3* 33.0*  MCV 94.6 95.3 95.8 96.3 95.4  PLT 175 194 146* 162 176    Cardiac Enzymes: No results for input(s): CKTOTAL, CKMB, CKMBINDEX, TROPONINI in the last 168 hours. BNP (last 3 results) No results for input(s): BNP in the last 8760 hours.  ProBNP (last 3 results) No results for input(s): PROBNP in the last 8760 hours.  CBG: No results for input(s): GLUCAP in the last 168 hours.  Recent Results (from the past 240 hour(s))  SARS CORONAVIRUS 2 (TAT 6-24 HRS) Nasopharyngeal Nasopharyngeal Swab     Status: None   Collection Time: 01/13/19  2:01 PM   Specimen: Nasopharyngeal Swab  Result Value Ref Range Status   SARS Coronavirus 2 NEGATIVE NEGATIVE Final    Comment: (NOTE) SARS-CoV-2 target nucleic acids are NOT DETECTED. The SARS-CoV-2 RNA is generally detectable in upper and lower respiratory specimens during the acute phase of infection. Negative results do not preclude SARS-CoV-2 infection, do not rule out co-infections with other pathogens, and should not be used as the sole basis for treatment or other patient management decisions. Negative results must be combined with clinical observations, patient history, and epidemiological information. The expected result is Negative. Fact Sheet for Patients: SugarRoll.be Fact Sheet for Healthcare Providers: https://www.woods-mathews.com/ This test is not yet  approved or cleared by the [place] and  has been authorized for detection and/or diagnosis of SARS-CoV-2 by FDA under an Emergency Use Authorization (EUA). This EUA will remain  in effect (meaning this test can be used) for the duration of the COVID-19 declaration under Section 56 4(b)(1) of the Act, 21 U.S.C. section 360bbb-3(b)(1), unless the authorization is terminated or revoked sooner. Performed at [facility], [place] [address] [place], [place] [address]   Culture, blood (routine x 2)     Status: None (Preliminary result)   Collection Time: 01/15/19 12:06 AM   Specimen: BLOOD LEFT  HAND  Result Value Ref Range Status   Specimen Description   Final    BLOOD LEFT HAND Performed at [facility] [address], [place], [place] [address]    Special Requests   Final    BOTTLES DRAWN AEROBIC ONLY Blood Culture results may not be optimal due to an excessive volume of blood received in culture bottles Performed at [facility] [address], [place], [place] [address]    Culture   Final    NO GROWTH 1 DAY Performed at [facility], [place] [address] [place], [place] [address]    Report Status PENDING  Incomplete  Culture, blood (routine x 2)     Status: None (Preliminary result)   Collection Time: 01/15/19 12:06 AM   Specimen: BLOOD  Result Value Ref Range Status   Specimen Description   Final    BLOOD LEFT ANTECUBITAL Performed at [facility] [address] [place], [place] [address]    Special Requests   Final    BOTTLES DRAWN AEROBIC ONLY Blood Culture results may not be optimal due to an excessive volume of blood received in culture bottles Performed at [facility] [address], [place], [place] [address]    Culture   Final    NO GROWTH 1 DAY Performed at [facility], [place] [address], [place], [place] [address]    Report Status PENDING  Incomplete     Studies: No results found.  Scheduled Meds: . apixaban  5 mg Oral BID  . escitalopram  10 mg Oral Daily  . feeding supplement  1 Container Oral TID BM  . lidocaine  1 patch Transdermal Q24H  . metoprolol tartrate  25 mg Oral BID  . predniSONE  10 mg Oral Q breakfast  . traMADol  50 mg Oral Once   Continuous Infusions: . sodium chloride 250 mL (01/16/19 1337)  . levofloxacin (LEVAQUIN) IV Stopped (01/15/19 1542)  . metronidazole 500 mg (01/16/19 1339)    Principal Problem:   Acute diverticulitis Active Problems:   Temporal arteritis (HCC)   Lumbar stenosis   PAF (paroxysmal atrial fibrillation) (HCC)    AKI (acute kidney injury) ([place])   Chronic pain   Tachycardia   Hypokalemia      Desiree Hane  [facility]

## 2019-01-16 NOTE — Progress Notes (Signed)
Pharmacy Antibiotic Note  Betty Jackson is a 84 y.o. female admitted on 01/13/2019 with diverticulitis.  Pharmacy has been consulted for Cipro po dosing.  Plan: Last Levaquin dose 10/18 Cipro 500mg  po bid Flagyl 500mg  IV to po q8h Will sign off Cipro protocol  Height: 5' 4.5" (163.8 cm) Weight: 159 lb 13.3 oz (72.5 kg) IBW/kg (Calculated) : 55.85  Temp (24hrs), Avg:98.3 F (36.8 C), Min:97.8 F (36.6 C), Max:98.7 F (37.1 C)  Recent Labs  Lab 01/12/19 1136 01/13/19 1230 01/14/19 0325 01/15/19 0006 01/16/19 0250  WBC 11.9* 10.4 9.9 10.5 15.5*  CREATININE 0.73 1.26* 1.50* 1.10* 0.77    Estimated Creatinine Clearance: 53.5 mL/min (by C-G formula based on SCr of 0.77 mg/dL).    Allergies  Allergen Reactions  . Dextromethorphan Rash    Antimicrobials this admission: 10/16 Flagyl>> 10/16 LVQ>> 10/19 10/10 Cipro  Thank you for allowing pharmacy to be a part of this patient's care.  Minda Ditto PharmD 01/16/2019 3:09 PM

## 2019-01-16 NOTE — Evaluation (Signed)
SLP Cancellation Note  Patient Details Name: Betty Jackson MRN: [id number] DOB: [birth year]   Cancelled treatment:       Reason Eval/Treat Not Completed: Other (comment)(pt currently asleep with mouth agape, RN reports giving pt Norvasc due to her pain, will see pt next date am, thanks for this order)   Macario Golds 01/16/2019, 3:50 PM   Luanna Salk, [facility] SLP [place] Pager [phone] Office [phone]

## 2019-01-17 DIAGNOSIS — R110 Nausea: Secondary | ICD-10-CM

## 2019-01-17 DIAGNOSIS — R112 Nausea with vomiting, unspecified: Secondary | ICD-10-CM | POA: Diagnosis present

## 2019-01-17 DIAGNOSIS — K5792 Diverticulitis of intestine, part unspecified, without perforation or abscess without bleeding: Secondary | ICD-10-CM | POA: Diagnosis not present

## 2019-01-17 LAB — BASIC METABOLIC PANEL
Anion gap: 9 (ref 5–15)
BUN: 15 mg/dL (ref 8–23)
CO2: 23 mmol/L (ref 22–32)
Calcium: 8.4 mg/dL — ABNORMAL LOW (ref 8.9–10.3)
Chloride: 107 mmol/L (ref 98–111)
Creatinine, Ser: 0.93 mg/dL (ref 0.44–1.00)
GFR calc Af Amer: >60 mL/min (ref >60)
GFR calc non Af Amer: 57 mL/min — ABNORMAL LOW (ref >60)
Glucose, Bld: 103 mg/dL — ABNORMAL HIGH (ref 70–99)
Potassium: 3.8 mmol/L (ref 3.5–5.1)
Sodium: 139 mmol/L (ref 135–145)

## 2019-01-17 LAB — CBC
HCT: 31.6 % — ABNORMAL LOW (ref 36.0–46.0)
Hemoglobin: 9.8 g/dL — ABNORMAL LOW (ref 12.0–15.0)
MCH: 29.2 pg (ref 26.0–34.0)
MCHC: 31 g/dL (ref 30.0–36.0)
MCV: 94 fL (ref 80.0–100.0)
Platelets: 195 10*3/uL (ref 150–400)
RBC: 3.36 MIL/uL — ABNORMAL LOW (ref 3.87–5.11)
RDW: 13.3 % (ref 11.5–15.5)
WBC: 13.7 10*3/uL — ABNORMAL HIGH (ref 4.0–10.5)
nRBC: 0 % (ref 0.0–0.2)

## 2019-01-17 MED ORDER — METOPROLOL TARTRATE 5 MG/5ML IV SOLN
5.0000 mg | Freq: Once | INTRAVENOUS | Status: AC
  Administered 2019-01-17: 23:00:00 5 mg via INTRAVENOUS

## 2019-01-17 MED ORDER — SODIUM CHLORIDE 0.9 % IV SOLN
INTRAVENOUS | Status: AC
  Administered 2019-01-17 – 2019-01-18 (×2): via INTRAVENOUS

## 2019-01-17 MED ORDER — DILTIAZEM HCL 25 MG/5ML IV SOLN
10.0000 mg | Freq: Once | INTRAVENOUS | Status: AC
  Administered 2019-01-17: 23:00:00 10 mg via INTRAVENOUS
  Filled 2019-01-17: qty 5

## 2019-01-17 MED ORDER — PROMETHAZINE HCL 25 MG/ML IJ SOLN: 12.5000 mg | Freq: Four times a day (QID) | INTRAMUSCULAR | Status: DC | PRN

## 2019-01-17 MED ORDER — POLYETHYLENE GLYCOL 3350 17 G PO PACK
17.0000 g | PACK | Freq: Every day | ORAL | Status: DC
  Filled 2019-01-17 (×4): qty 1

## 2019-01-17 MED ORDER — DIGOXIN 0.25 MG/ML IJ SOLN: 0.5000 mg | Freq: Once | INTRAMUSCULAR | Status: DC

## 2019-01-17 MED ORDER — SODIUM CHLORIDE 0.9 % IV BOLUS: 500.0000 mL | Freq: Once | INTRAVENOUS | Status: AC

## 2019-01-17 MED ORDER — DIGOXIN 0.25 MG/ML IJ SOLN
0.5000 mg | Freq: Once | INTRAMUSCULAR | Status: AC
  Administered 2019-01-17: 0.5 mg via INTRAVENOUS
  Filled 2019-01-17: qty 2

## 2019-01-17 MED ORDER — SENNOSIDES-DOCUSATE SODIUM 8.6-50 MG PO TABS
2.0000 | ORAL_TABLET | Freq: Two times a day (BID) | ORAL | Status: DC
  Administered 2019-01-17 – 2019-01-19 (×3): 2 via ORAL
  Filled 2019-01-17 (×6): qty 2

## 2019-01-17 MED ORDER — DILTIAZEM HCL 25 MG/5ML IV SOLN: 10.0000 mg | Freq: Once | INTRAVENOUS | Status: DC

## 2019-01-17 MED ORDER — PANTOPRAZOLE SODIUM 40 MG PO TBEC
40.0000 mg | DELAYED_RELEASE_TABLET | Freq: Every day | ORAL | Status: DC
  Administered 2019-01-17 – 2019-01-20 (×4): 40 mg via ORAL
  Filled 2019-01-17 (×4): qty 1

## 2019-01-17 MED ORDER — SODIUM CHLORIDE 0.9 % IV BOLUS
500.0000 mL | Freq: Once | INTRAVENOUS | Status: AC
  Administered 2019-01-17: 23:00:00 500 mL via INTRAVENOUS

## 2019-01-17 MED ORDER — METOPROLOL TARTRATE 5 MG/5ML IV SOLN
INTRAVENOUS | Status: AC
  Filled 2019-01-17: qty 5

## 2019-01-17 NOTE — Evaluation (Signed)
Clinical/Bedside Swallow Evaluation Patient Details  Name: Betty Jackson MRN: [id number] Date of Birth: 30-Sep-1934  Today's Date: 01/17/2019 Time: SLP Start Time (ACUTE ONLY): 1035 SLP Stop Time (ACUTE ONLY): 1110 SLP Time Calculation (min) (ACUTE ONLY): 35 min  Past Medical History:  Past Medical History:  Diagnosis Date  . Abdominal pain, epigastric 06/22/2013  . Abdominal pain, other specified site 12/24/2011  . Abnormal urinalysis 05/20/2017  . Acute respiratory failure with hypoxia ([place]) 05/20/2017  . Anemia, unspecified 10/28/2012  . Anxiety state, unspecified 10/28/2012  . Bruises easily    d/t being on prednisone and plavix  . CAD (coronary artery disease)    a. Stent RCA in [facility];  b. Cath approx 2009 - nonobs per pt report.  . Cataract    immature on the left eye  . Chronic insomnia 02/07/2013  . Chronic lower back pain    scoliosis  . CKD (chronic kidney disease) stage 3, GFR 30-59 ml/min   . Complication of anesthesia    pt has a very high tolerance to meds  . Coronary disease 05/20/2017  . CVA (cerebral infarction) 10/29/2012  . DDD (degenerative disc disease)   . Depression   . Diverticulitis    hx of  . Diverticulosis   . Elevated transaminase level 02/07/2013  . Enteritis   . GERD (gastroesophageal reflux disease) 09/01/2010  . Giant cell arteritis ([place])   . Hemorrhoids   . Herniated nucleus pulposus, L5-S1, right 11/04/2015  . History of scabies   . HTN (hypertension) 05/20/2017  . Hyperlipidemia    takes Lipitor daily  . Hypertension    takes Amlodipine,Losartan,Metoprolol,and HCTZ daily  . Incontinence of urine   . Influenza A 05/20/2017  . Insomnia    takes Restoril nightly  . Lumbar stenosis 04/25/2013  . Major depressive disorder, recurrent episode, moderate ([place]) 07/16/2013  . Migraines    "back in my 20's; none since" (10/28/2012)  . Osteoporosis   . PAF (paroxysmal atrial fibrillation) ([place]) 2011   a. lone epidode in 2011 according to notes.   . Rheumatoid arthritis ([place])   . Scoliosis   . Sepsis ([place]) 05/20/2017  . Sinus bradycardia    a. on chronic bb  . Temporal arteritis ([place]) 2011   a. followed @ [facility]; potential flareup 10/28/2012/notes 10/28/2012  . Vocal cord dysfunction    "they don't operate properly" (10/28/2012)   Past Surgical History:  Past Surgical History:  Procedure Laterality Date  . ABDOMINAL HYSTERECTOMY  ~ 1984   vaginally  . BACK SURGERY  7-24yrs ago   X Stop  . BLADDER SUSPENSION  2001  . BREAST BIOPSY Right   . CATARACT EXTRACTION W/ INTRAOCULAR LENS IMPLANT Right ~ 08/2012  . COLONOSCOPY  01/26/2012   Procedure: COLONOSCOPY;  Surgeon: Ladene Artist, MD,FACG;  Location: [facility] ENDOSCOPY;  Service: Endoscopy;  Laterality: N/A;  note the EGD is possible  . CORONARY ANGIOPLASTY WITH STENT PLACEMENT  2006   X 1 stent  . EPIDURAL BLOCK INJECTION    . ESOPHAGOGASTRODUODENOSCOPY  01/26/2012   Procedure: ESOPHAGOGASTRODUODENOSCOPY (EGD);  Surgeon: Ladene Artist, MD,FACG;  Location: [facility] ENDOSCOPY;  Service: Endoscopy;  Laterality: N/A;  . HEMIARTHROPLASTY HIP Right 2012  . LAPAROSCOPIC CHOLECYSTECTOMY  2001  . LUMBAR FUSION  03/2013  . LUMBAR LAMINECTOMY/DECOMPRESSION MICRODISCECTOMY Right 11/04/2015   Procedure: Right Lumbar Five-Sacral One Microdiskectomy;  Surgeon: Kristeen Miss, MD;  Location: [place] NEURO ORS;  Service: Neurosurgery;  Laterality: Right;  Right L5-S1 Microdiskectomy  .  moles removed that required stiches     one on leg and one on face  . TEMPORAL ARTERY BIOPSY / LIGATION Bilateral 2011  . TONSILLECTOMY AND ADENOIDECTOMY     at age 55  . TUBAL LIGATION  ~ 1982  . X-STOP IMPLANTATION  ~ 2010   "lower back" (10/28/2012)   HPI:  pt is an 84 yo female adm to [facility] with acute diverticulitis. Pt with PMH of CAD, GERD, low back pain, RA on chronic prednisone, temporal arteritis presenting with decreased po intake and N/V.  She was diagnosed with diverticulitis *first time dx per pt*.  CXR showed no acute  pulmonary conditions, left midline base scar like densities - no opacities.  Last CT head 07/2018 showed atrophy and Small vessell ischemic changes in white matter.  Swallow eval ordered as pt complained of something in her throat.  SLP attempted to see pt yesterday x2 but she was either busy or sleeping.   Assessment / Plan / Recommendation Clinical Impression  Pt presents with symptoms consistent with her already dx GERD *she states she took a reflux medicine approx 8 years ago* and she has h/o VCD diagnosed 20 years prior.  No focal CN deficit nor indication of dysphagia/aspiration.  Pt reports her sensation of "something stuck in the throat" is alleviated with swallowing thus SLP questions if she may have a hypertonic UES.  SLP administered Reflux Symptom Index (RSI) screen and pt scored 32/45 - highly indicative of LaryngoPharyngeal Reflux (LPR).  Her hallmark symptoms including hoarseness, chronic cough, throat clearing, difficulty swallowing.  Given h/o GERD, SLP provided her with compensation strategies to mitigate her symptoms. Advised pt to re-score monthy with goal to achieve below 30.  Also advised her to remove her partial nightly and clean it to decrease bacterial accumulation in oral cavity especially given her xerostomia.  No further SLP warranted at this time, thanks for this referral. SLP Visit Diagnosis: Dysphagia, unspecified (R13.10)    Aspiration Risk  Mild aspiration risk    Diet Recommendation Regular;Thin liquid   Liquid Administration via: Straw;Cup Medication Administration: (as tolerated, with puree whole is helpful per pt) Supervision: Patient able to self feed Compensations: Slow rate;Small sips/bites Postural Changes: Remain upright for at least 30 minutes after po intake;Seated upright at 90 degrees    Other  Recommendations Oral Care Recommendations: Oral care BID   Follow up Recommendations None      Frequency and Duration   n/a         Prognosis    n/a     Swallow Study   General Date of Onset: 01/17/19 HPI: pt is an 83 yo female adm to [facility] with acute diverticulitis. Pt with PMH of CAD, GERD, low back pain, RA on chronic prednisone, temporal arteritis presenting with decreased po intake and N/V.  She was diagnosed with diverticulitis *first time dx per pt*.  CXR showed no acute pulmonary conditions, left midline base scar like densities - no opacities.  Last CT head 07/2018 showed atrophy and Small vessell ischemic changes in white matter.  Swallow eval ordered as pt complained of something in her throat.  SLP attempted to see pt yesterday x2 but she was either busy or sleeping. Type of Study: Bedside Swallow Evaluation Previous Swallow Assessment: none Diet Prior to this Study: Regular;Thin liquids Temperature Spikes Noted: No Respiratory Status: Room air History of Recent Intubation: No Behavior/Cognition: Alert;Cooperative;Pleasant mood Oral Care Completed by SLP: Other (Comment)(provided pt with toothbrush/water etc to instruct to  brush dentition) Oral Cavity - Dentition: Adequate natural dentition(has a partial) Vision: Functional for self-feeding Self-Feeding Abilities: Able to feed self Patient Positioning: Upright in bed Baseline Vocal Quality: Hoarse Volitional Cough: Strong Volitional Swallow: Able to elicit    Oral/Motor/Sensory Function Overall Oral Motor/Sensory Function: Within functional limits   Ice Chips Ice chips: Not tested   Thin Liquid Thin Liquid: Within functional limits Presentation: Self Fed;Straw    Nectar Thick Nectar Thick Liquid: Not tested   Honey Thick Honey Thick Liquid: Not tested   Puree Puree: Not tested   Solid     Solid: Within functional limits Presentation: Self Fed     Luanna Salk, MS [facility] SLP Acute Rehab Services Pager [phone] Office [phone]  Macario Golds 01/17/2019,11:46 AM

## 2019-01-17 NOTE — Progress Notes (Signed)
Patients HR is currently having irregular pulses ranging from 30's to the 160's. MEWS documentation completed. MD made aware. We will hold BB and order 12 lead EKG. Will continue to monitor.

## 2019-01-17 NOTE — Progress Notes (Addendum)
[facility]  PROGRESS NOTE  Betty Jackson [id number] DOB: Mar 17, 1935 DOA: 01/13/2019 PCP: Eulas Post, MD  Brief History   HPI: Betty Jackson is a 84 y.o. female with history h/o CAD status post PTCA, chronic low back pain, CKD stage III, DDD, GERD, CVA, paroxysmal A. fib on Eliquis, rheumatoid arthritis/temporal arteritis on chronic prednisolone, anxiety/depression/chronic insomnia and prior history of diverticulitis presented to the ED on 01/13/19 with complaints of nausea, emesis, and diarrhea.  Initially seen by PCP on 10/14 for abdominal pain, nausea, vomiting and given PPI with instructions to return to ED if symptoms worsen.  Was seen at [facility] ED on 10/15 due to worsening belly pain and poor p.o. intake and was found to have acute diverticulitis on CT abdomen and discharge from ED with prescription for chronic oxycodone in addition to Cipro, Flagyl, Zofran, Bentyl.  Patient presented to our ED with persistent abdominal pain, diminished oral intake and inability to tolerate oral antibiotics  ED Course: T-max 102.2, blood pressure range 104/56-160/61, heart rate 103.  Covid negative.Lab work notable for potassium 3.4, creatinine 1.26 (baseline 0.7-1), AST 44, ALT 51, alk phos 141, T bili 2.7, lipase 34, hemoglobin 11.3.  Chest x-ray with no acute abnormalities.  Patient was given IV fluids, IV morphine, IV Flagyl, IV Levaquin and admitted to [facility] for further management of acute diverticulitis.  CT abdomen here showed diverticulitis without local complications.  Has been medically managed with IV fluids, IV Levaquin, IV Flagyl until able to tolerate po.  Transition to oral antibiotics on 10/19 and tolerated well without fevers.  On 2/20 patient having persistent nausea not responsive to oral antiemetics, minimal p.o. intake without much abdominal pain.  Transition back to IV antiemetics and closely monitor to ensure able to tolerate oral antibiotics, and  started back on home Protonix for GERD/globus sensation   A & P     Acute diverticulitis without abscess or perforation, unable to tolerate oral antibiotics as outpatient, slightly worsening.  Has poor p.o. intake related to persistent nausea not responsive to oral antiemetics but reports improvement in abdominal pain.  White count downtrending, has remained afebrile, blood cultures are remarkable.  Add IV phergan for refractory nausea, optimize bowel regimen in the setting of opiate pain control this could be contributing to nausea, if no improvement with supportive care would recommend repeat abdominal imaging to ensure no worsening diverticulitis, if nausea preventing from taking oral antibiotics may need to switch back to IV will closely monitor.  Consulting dietary as patient not even tolerating boost supplementation due to nausea.   SIRS criteria, resolved.  Last fever in afternoon on 10/18.  Tachycardia has resolved.  Sepsis physiology has resolved.  Blood cultures remain unremarkable   AKI on CKD stage III, suspect prerenal etiology in the setting of diminished oral intake, nausea/emesis, improving.  Holding home losartan/Lasix.  Creatinine returned to baseline no longer requiring IV fluids for that however only minimal oral intake related to nausea here.  Serial BMP, avoid nephrotoxins, monitor output   Hypokalemia in setting of diarrhea, resolved.  No longer vomiting, replete as necessary, serial BMP monitoring.   Concern for dysphagia.  Denies any prior history.  Seem to tolerate eating diet this morning, will have speech evaluate formally.   Neck pain, improved.  Reproducible on palpation.  Seem consistent with musculoskeletal.  warm compresses, closely monitor.   Paroxysmal atrial fibrillation, rate controlled.  Confirmed history in cardiology outpt notes, home Eliquis   Hypertension,  currently at goal.  Continue home Lopressor  continue to hold losartan until kidney function  returns to baseline.   Rheumatoid arthritis/temporal arteritis on chronic prednisone,  home prednisone 10 mg   Anxiety, stable.  Daughter reports patient uses benzodiazepine sparingly.  Will hold off on further sedating medications on IV Dilaudid   Depression,  home Lexapro .   Chronic low back pain.  No new neurologic deficits. On home Bentyl, Vicodin, oxycodone IR   Chronic normocytic anemia, hemoglobin stable at baseline.  No active signs of bleeding.  Closely monitor.   GERD.  Has history of vocal cord dysfunction related to this.  Speech evaluated no concerns for dysphagia.  Resume home Protonix, hopeful this will improve nausea      DVT prophylaxis: Eliquis Code Status: DNR confirmed on day of admission Family Communication: Spoke with daughter [name] on 10/20 and updated onplan Disposition Plan: Monitor abdominal exam/oral intake/nausea clinical status on oral antibiotics, anticipate discharge next 24 hours     [facility] Direct contact: see [email or web address] (further directions at bottom of note if needed) 7PM-7AM contact night coverage as at bottom of note 01/17/2019, 3:17 PM  LOS: 4 days   Consultants  . None  Procedures  . None  Antibiotics  . IV Cipro Flagyl, admission date-10/19 . Oral Cipro/Flagyl 10/19-  Interval History/Subjective  No vomiting overnight Persistent nausea Decreased appetite Sore throat  Objective   Vitals:  Vitals:   01/16/19 2109 01/17/19 0527  BP: (!) 158/85 106/69  Pulse: (!) 101 (!) 108  Resp: 19 18  Temp: 98.3 F (36.8 C) 99.3 F (37.4 C)  SpO2: 97% 96%    Exam: Lying in bed, no distress Regular rate, irregularly irregular rhythm, no appreciable murmurs, no edema Normal respiratory effort on room air Abdomen soft, nondistended, diminished bowel sounds, no rebound tenderness or guarding    I have personally reviewed the following:   Data Reviewed: Basic Metabolic Panel: Recent Labs  Lab 01/13/19 1230  01/14/19 0325 01/15/19 0006 01/16/19 0250 01/17/19 0403  NA 139 140 142 139 139  K 3.4* 4.0 3.8 3.5 3.8  CL 100 105 109 107 107  CO2 26 20* 20* 23 23  GLUCOSE 106* 84 114* 147* 103*  BUN 12 12 7* 7* 15  CREATININE 1.26* 1.50* 1.10* 0.77 0.93  CALCIUM 8.7* 8.5* 8.9 8.8* 8.4*   Liver Function Tests: Recent Labs  Lab 01/12/19 1136 01/13/19 1230  AST 16 44*  ALT 14 51*  ALKPHOS 77 141*  BILITOT 2.0* 2.7*  PROT 6.8 6.7  ALBUMIN 3.7 3.4*   Recent Labs  Lab 01/12/19 1136 01/13/19 1230  LIPASE 30 34   No results for input(s): AMMONIA in the last 168 hours. CBC: Recent Labs  Lab 01/12/19 1136 01/13/19 1230 01/14/19 0325 01/15/19 0006 01/16/19 0250 01/17/19 0403  WBC 11.9* 10.4 9.9 10.5 15.5* 13.7*  NEUTROABS 8.5* 7.6  --   --   --   --   HGB 12.3 11.3* 10.9* 9.6* 10.3* 9.8*  HCT 38.5 36.2 34.6* 31.3* 33.0* 31.6*  MCV 94.6 95.3 95.8 96.3 95.4 94.0  PLT 175 194 146* 162 176 195   Cardiac Enzymes: No results for input(s): CKTOTAL, CKMB, CKMBINDEX, TROPONINI in the last 168 hours. BNP (last 3 results) No results for input(s): BNP in the last 8760 hours.  ProBNP (last 3 results) No results for input(s): PROBNP in the last 8760 hours.  CBG: No results for input(s): GLUCAP in the last 168 hours.  Recent  Results (from the past 240 hour(s))  SARS CORONAVIRUS 2 (TAT 6-24 HRS) Nasopharyngeal Nasopharyngeal Swab     Status: None   Collection Time: 01/13/19  2:01 PM   Specimen: Nasopharyngeal Swab  Result Value Ref Range Status   SARS Coronavirus 2 NEGATIVE NEGATIVE Final    Comment: (NOTE) SARS-CoV-2 target nucleic acids are NOT DETECTED. The SARS-CoV-2 RNA is generally detectable in upper and lower respiratory specimens during the acute phase of infection. Negative results do not preclude SARS-CoV-2 infection, do not rule out co-infections with other pathogens, and should not be used as the sole basis for treatment or other patient management decisions. Negative  results must be combined with clinical observations, patient history, and epidemiological information. The expected result is Negative. Fact Sheet for Patients: SugarRoll.be Fact Sheet for Healthcare Providers: https://www.woods-mathews.com/ This test is not yet approved or cleared by the [place] FDA and  has been authorized for detection and/or diagnosis of SARS-CoV-2 by FDA under an Emergency Use Authorization (EUA). This EUA will remain  in effect (meaning this test can be used) for the duration of the COVID-19 declaration under Section 56 4(b)(1) of the Act, 21 U.S.C. section 360bbb-3(b)(1), unless the authorization is terminated or revoked sooner. Performed at [facility], [place] [address] [place], [place] [address]   Culture, blood (routine x 2)     Status: None (Preliminary result)   Collection Time: 01/15/19 12:06 AM   Specimen: BLOOD LEFT HAND  Result Value Ref Range Status   Specimen Description   Final    BLOOD LEFT HAND Performed at [facility] [address] [place], [place] [address]    Special Requests   Final    BOTTLES DRAWN AEROBIC ONLY Blood Culture results may not be optimal due to an excessive volume of blood received in culture bottles Performed at [facility] [address], [place], [place] [address]    Culture   Final    NO GROWTH 2 DAYS Performed at [facility] [address] [place], [place] [address]    Report Status PENDING  Incomplete  Culture, blood (routine x 2)     Status: None (Preliminary result)   Collection Time: 01/15/19 12:06 AM   Specimen: BLOOD  Result Value Ref Range Status   Specimen Description   Final    BLOOD LEFT ANTECUBITAL Performed at [facility] [address] [place], [place] [address]    Special Requests   Final    BOTTLES DRAWN AEROBIC ONLY Blood Culture results may not be optimal due to an  excessive volume of blood received in culture bottles Performed at [facility] [address] [place], [place] [address]    Culture   Final    NO GROWTH 2 DAYS Performed at [facility] [address], [place], [place] [address]    Report Status PENDING  Incomplete     Studies: No results found.  Scheduled Meds: . apixaban  5 mg Oral BID  . ciprofloxacin  500 mg Oral BID  . escitalopram  10 mg Oral Daily  . feeding supplement  1 Container Oral TID BM  . lidocaine  1 patch Transdermal Q24H  . metoprolol tartrate  25 mg Oral BID  . metroNIDAZOLE  500 mg Oral Q8H  . predniSONE  10 mg Oral Q breakfast  . traMADol  50 mg Oral Once   Continuous Infusions: . sodium chloride 250 mL (01/16/19  1337)  . sodium chloride      Principal Problem:   Acute diverticulitis Active Problems:   Temporal arteritis (HCC)   Lumbar stenosis   PAF (paroxysmal atrial fibrillation) (HCC)   AKI (acute kidney injury) ([place])   Chronic pain   Tachycardia   Hypokalemia      Desiree Hane  [facility]

## 2019-01-17 NOTE — Progress Notes (Signed)
RR at bedside. Patient still remains in a-fib with a HR of 159. MEWS in red now with elevated respirations, otherwise asymptomatic. 5 mg lopressor and 500 ml bolus ordered. RR monitored pt for 30-45 min. MD made aware rate was still running 150-160's. Cardizem and Digoxin ordered. Will continue to monitor.

## 2019-01-18 ENCOUNTER — Telehealth: Payer: Self-pay | Admitting: Cardiovascular Disease

## 2019-01-18 ENCOUNTER — Inpatient Hospital Stay (HOSPITAL_COMMUNITY): Payer: Medicare Other

## 2019-01-18 DIAGNOSIS — N179 Acute kidney failure, unspecified: Secondary | ICD-10-CM

## 2019-01-18 DIAGNOSIS — R0600 Dyspnea, unspecified: Secondary | ICD-10-CM

## 2019-01-18 DIAGNOSIS — K5792 Diverticulitis of intestine, part unspecified, without perforation or abscess without bleeding: Secondary | ICD-10-CM | POA: Diagnosis not present

## 2019-01-18 DIAGNOSIS — I361 Nonrheumatic tricuspid (valve) insufficiency: Secondary | ICD-10-CM

## 2019-01-18 LAB — ECHOCARDIOGRAM COMPLETE
Height: 64.5 in
Weight: 2557.34 oz

## 2019-01-18 LAB — BASIC METABOLIC PANEL
Anion gap: 10 (ref 5–15)
BUN: 22 mg/dL (ref 8–23)
CO2: 21 mmol/L — ABNORMAL LOW (ref 22–32)
Calcium: 8 mg/dL — ABNORMAL LOW (ref 8.9–10.3)
Chloride: 106 mmol/L (ref 98–111)
Creatinine, Ser: 0.91 mg/dL (ref 0.44–1.00)
GFR calc Af Amer: >60 mL/min (ref >60)
GFR calc non Af Amer: 59 mL/min — ABNORMAL LOW (ref >60)
Glucose, Bld: 101 mg/dL — ABNORMAL HIGH (ref 70–99)
Potassium: 3.6 mmol/L (ref 3.5–5.1)
Sodium: 137 mmol/L (ref 135–145)

## 2019-01-18 LAB — CBC
HCT: 31.9 % — ABNORMAL LOW (ref 36.0–46.0)
Hemoglobin: 10.2 g/dL — ABNORMAL LOW (ref 12.0–15.0)
MCH: 30 pg (ref 26.0–34.0)
MCHC: 32 g/dL (ref 30.0–36.0)
MCV: 93.8 fL (ref 80.0–100.0)
Platelets: 220 10*3/uL (ref 150–400)
RBC: 3.4 MIL/uL — ABNORMAL LOW (ref 3.87–5.11)
RDW: 13.4 % (ref 11.5–15.5)
WBC: 10.1 10*3/uL (ref 4.0–10.5)
nRBC: 0 % (ref 0.0–0.2)

## 2019-01-18 MED ORDER — PERFLUTREN LIPID MICROSPHERE
1.0000 mL | INTRAVENOUS | Status: AC | PRN
  Administered 2019-01-18: 2 mL via INTRAVENOUS
  Filled 2019-01-18: qty 10

## 2019-01-18 MED ORDER — DIGOXIN 0.25 MG/ML IJ SOLN
0.5000 mg | Freq: Once | INTRAMUSCULAR | Status: AC
  Administered 2019-01-18: 0.5 mg via INTRAVENOUS
  Filled 2019-01-18: qty 2

## 2019-01-18 MED ORDER — DILTIAZEM HCL 25 MG/5ML IV SOLN
10.0000 mg | Freq: Once | INTRAVENOUS | Status: AC
  Administered 2019-01-18: 08:00:00 10 mg via INTRAVENOUS
  Filled 2019-01-18: qty 5

## 2019-01-18 MED ORDER — ADULT MULTIVITAMIN W/MINERALS CH
1.0000 | ORAL_TABLET | Freq: Every day | ORAL | Status: DC
  Administered 2019-01-18 – 2019-01-20 (×3): 1 via ORAL
  Filled 2019-01-18 (×3): qty 1

## 2019-01-18 MED ORDER — PROMETHAZINE HCL 25 MG/ML IJ SOLN: 6.2500 mg | Freq: Four times a day (QID) | INTRAMUSCULAR | Status: DC | PRN

## 2019-01-18 MED ORDER — ONDANSETRON HCL 4 MG/2ML IJ SOLN
4.0000 mg | Freq: Three times a day (TID) | INTRAMUSCULAR | Status: DC
  Filled 2019-01-18 (×2): qty 2

## 2019-01-18 MED ORDER — METOPROLOL TARTRATE 5 MG/5ML IV SOLN
5.0000 mg | Freq: Once | INTRAVENOUS | Status: AC
  Administered 2019-01-18: 5 mg via INTRAVENOUS
  Filled 2019-01-18: qty 5

## 2019-01-18 MED ORDER — DILTIAZEM HCL-DEXTROSE 125-5 MG/125ML-% IV SOLN (PREMIX)
5.0000 mg/h | INTRAVENOUS | Status: DC
  Administered 2019-01-18: 10 mg/h via INTRAVENOUS
  Administered 2019-01-18: 5 mg/h via INTRAVENOUS
  Administered 2019-01-19: 15 mg/h via INTRAVENOUS
  Filled 2019-01-18 (×6): qty 125

## 2019-01-18 MED FILL — Dextrose Inj 5%: INTRAVENOUS | Qty: 100 | Status: AC

## 2019-01-18 MED FILL — Diltiazem HCl IV Soln 125 MG/25ML (5 MG/ML): INTRAVENOUS | Qty: 125 | Status: AC

## 2019-01-18 NOTE — Progress Notes (Signed)
  Echocardiogram 2D Echocardiogram has been performed.  Burnett Kanaris 01/18/2019, 4:08 PM

## 2019-01-18 NOTE — Progress Notes (Signed)
Patient continues to run in the 130-140s. Patient will transfer to 4E with cardiac monitoring.

## 2019-01-18 NOTE — Progress Notes (Signed)
Initial Nutrition Assessment  RD working remotely.   DOCUMENTATION CODES:   Not applicable  INTERVENTION:  - continue Boost Breeze TID, each supplement provides 250 kcal and 9 grams of protein--added recommendations to order. - will order Magic cup TID with meals, each supplement provides 290 kcal and 9 grams of protein - will order daily multivitamin with minerals. - continue to encourage PO intakes as tolerated.    NUTRITION DIAGNOSIS:   Inadequate oral intake related to decreased appetite, nausea, acute illness as evidenced by per patient/family report.  GOAL:   Patient will meet greater than or equal to 90% of their needs  MONITOR:   PO intake, Supplement acceptance, Labs, Weight trends, I & O's  REASON FOR ASSESSMENT:   Consult Poor PO, Assessment of nutrition requirement/status  ASSESSMENT:   84 y.o. female with medical history of CAD s/p PTCA, chronic low back pain, stage 3 CKD, DDD, GERD, CVA, A. fib on Eliquis, rheumatoid arthritis/temporal arteritis on chronic prednisolone, anxiety/depression/chronic insomnia, and prior history of diverticulitis. She presented to the ED on 10/16 with complaints of abdominal pain and N/V/D. She was seen by PCP on 10/14 for the same and given PPI at that time. She was seen at [facility] ED on 10/15 at which time CT abdomen was done and she was diagnosed with acute diverticulitis and then sent home on Cipro, flagyl, Zofran, and bentyl. She presented to [facility] ED with persistent abdominal pain, decreased oral intake, and inability to tolerate oral antibiotics. She was admitted for management of diverticulitis.  Able to communicate with Dr. Tana Coast via secure chat about switching to scheduled anti-emetics (rather than PRN) to determine if this helps with nausea and therefore helps with PO intakes. Patient continues to have nausea which is often worsened with PO intakes, including beverages such as Boost Breeze. She has been drinking to eat  smaller amounts and sip on liquids as best as she can but often finds it difficult. She had a good appetite and was eating well prior to diverticulitis dx. Abdominal pain has begun to improve slightly, but nausea remains a significant barrier to adequate PO intake. Diarrhea has resolved.   SLP saw patient on 10/20 and stated patient is at mild risk for aspiration and that regular, thin liquids is appropriate for her.   Patient denies any recent weight changes. Per chart review, current weight is 160 lb and weight has been stable for the past 1 year.   Per notes: - acute diverticulitis without abscess or perforation--slightly worsening  - poor PO intakes 2/2 persistent nausea--not responding to oral anti-emetics - possibility that opiate-based pain medication is contributing to nausea  - SIRS--resolved - AKI on stage 3 CKD - concern for dysphagia 2/2 GERD with hx of coval cord dysfunction--SLP evaluation done 10/20 - neck pain--improved - chronic low back pain - normocytic anemia    Labs reviewed; Ca: 8 mg/dl. Medications reviewed; PRN IV zofran, 40 mg oral protonix/day, 1 packet miralax/day, 10 mg deltasone/day, PRN IV phenergan, 2 tablets senokot BID IVF; NS @ 100 ml/hr.    NUTRITION - FOCUSED PHYSICAL EXAM:  unable to complete at this time.   Diet Order:   Diet Order            Diet Heart Room service appropriate? Yes; Fluid consistency: Thin  Diet effective now              EDUCATION NEEDS:   No education needs have been identified at this time  Skin:  Skin Assessment: Reviewed RN Assessment  Last BM:  10/19  Height:   Ht Readings from Last 1 Encounters:  01/13/19 5' 4.5" (1.638 m)    Weight:   Wt Readings from Last 1 Encounters:  01/13/19 72.5 kg    Ideal Body Weight:  55.7 kg  BMI:  Body mass index is 27.01 kg/m.  Estimated Nutritional Needs:   Kcal:  1650-1900 kcal  Protein:  75-90 grams  Fluid:  >/= 1.8 L/day      Jarome Matin, MS,  RD, LDN, [facility] Clinical Dietitian Pager # [phone] After hours/weekend pager # [phone]

## 2019-01-18 NOTE — Telephone Encounter (Signed)
New Message     Patient's daughter calling due to patient being admitted into the hospital and would like a nurse to call her back.

## 2019-01-18 NOTE — Progress Notes (Signed)
[facility]                                                                              Patient Demographics  Betty Jackson, is a 84 y.o. female, DOB - [birth year], [id number]  Admit date - 01/13/2019   Admitting Physician Guilford Shi, MD  Outpatient Primary MD for the patient is Eulas Post, MD  Outpatient specialists:   LOS - 5  days   Medical records reviewed and are as summarized below:    Chief Complaint  Patient presents with   Emesis   Nausea   Diarrhea   Fatigue       Brief summary   Patient is a 84 year old female with history of CAD status post PTCA, chronic low back pain, CKD stage III, GERD, CVA, history of paroxysmal A. fib on Eliquis, history of rheumatoid arthritis/temporal arteritis on chronic prednisone, anxiety/depression and prior history of diverticulitis presented on 10/16 with nausea, emesis and diarrhea.  CT of the abdomen and pelvis showed acute diverticulitis, was seen at [facility] ED on 10/15.  Patient returned to our ED with persistent abdominal pain diminished oral intake and inability to tolerate oral antibiotics. In ED T-max 102.2 F, heart rate 103, BP 104/56.  COVID-19 negative Patient was placed on IV antibiotics, pain control and admitted for further work-up.  10/21: Overnight patient went into A. fib with RVR requiring Cardizem IV bolus, digoxin, beta-blocker Started on IV Cardizem drip   Assessment & Plan    Principal Problem:   Acute diverticulitis : Uncomplicated without abscess or perforation, SIRS -Still having persistent nausea, placed on scheduled IV Zofran, continue Phenergan as needed - cont ciprofloxacin and Flagyl -Not tolerating diet well due to constant nausea  Active Problems: Atrial fibrillation with RVR -Overnight patient noted to be in RVR, atrial fibrillation confirmed on EKG, heart rate in 140s to 150s.  Received digoxin, Lopressor, Cardizem bolus x2 -At the time of my  examination, heart rate still not controlled and in 150s.  Placed on IV Cardizem drip -Continue Eliquis and beta-blocker. -Cardiology consulted.  AKI on CKD stage III -Prerenal likely due to diminished oral intake, persistent nausea, emesis -Patient presented with creatinine of 1.5, now improving, with IV fluid hydration -Hold home  losartan, Lasix  Essential hypertension Continue Lopressor, currently on IV Cardizem drip, BP stable  History of rheumatoid arthritis, temporal arteritis -Continue chronic prednisone  History of anxiety, depression Continue Lexapro  Chronic low back pain -Continue pain control as home regimen  Chronic normocytic anemia H&H stable  GERD -Speech evaluated with no concerns for dysphagia.  Continue PPI  Generalized debility -Appears to be very weak and debilitated, will start PT evaluation  Code Status: DNR DVT Prophylaxis: Eliquis Family Communication: Discussed all imaging results, lab results, explained to the patient's daughter   Disposition Plan: Once heart rate controlled and tolerating diet and antibiotics.  Very debilitated, patient's daughter concerned about patient being home, husband is disabled.  She is okay with [facility] seems ready for rehab if needed.  Will await PT evaluation  Time Spent in minutes 35 minutes  Procedures:  None  Consultants:  Cardiology  Antimicrobials:   Anti-infectives (From admission, onward)   Start     Dose/Rate Route Frequency Ordered Stop   01/16/19 2200  metroNIDAZOLE (FLAGYL) tablet 500 mg     500 mg Oral Every 8 hours 01/16/19 1456 01/20/19 0559   01/16/19 1800  ciprofloxacin (CIPRO) tablet 500 mg     500 mg Oral 2 times daily 01/16/19 1508     01/15/19 1400  levofloxacin (LEVAQUIN) IVPB 750 mg  Status:  Discontinued     750 mg 100 mL/hr over 90 Minutes Intravenous Every 48 hours 01/13/19 1634 01/16/19 1451   01/13/19 1345  levofloxacin (LEVAQUIN) IVPB 750 mg     750 mg 100 mL/hr over 90  Minutes Intravenous  Once 01/13/19 1342 01/13/19 1529   01/13/19 1345  metroNIDAZOLE (FLAGYL) IVPB 500 mg  Status:  Discontinued     500 mg 100 mL/hr over 60 Minutes Intravenous Every 8 hours 01/13/19 1342 01/16/19 1451          Medications  Scheduled Meds:  apixaban  5 mg Oral BID   ciprofloxacin  500 mg Oral BID   escitalopram  10 mg Oral Daily   feeding supplement  1 Container Oral TID BM   lidocaine  1 patch Transdermal Q24H   metoprolol tartrate  25 mg Oral BID   metroNIDAZOLE  500 mg Oral Q8H   multivitamin with minerals  1 tablet Oral Daily   pantoprazole  40 mg Oral Daily   polyethylene glycol  17 g Oral Daily   predniSONE  10 mg Oral Q breakfast   senna-docusate  2 tablet Oral BID   traMADol  50 mg Oral Once   Continuous Infusions:  sodium chloride 250 mL (01/16/19 1337)   sodium chloride 100 mL/hr at 01/18/19 1400   diltiazem (CARDIZEM) infusion 15 mg/hr (01/18/19 1004)   PRN Meds:.sodium chloride, acetaminophen **OR** acetaminophen, HYDROcodone-acetaminophen, morphine injection, ondansetron (ZOFRAN) IV, promethazine      Subjective:   Betty Jackson was seen and examined today.  Overnight events noted, in rapid A. fib at the time of my examination.  No chest pain.  Felt dizzy and lightheaded.  No fevers. Patient denies dizziness, new focal weakness.  Still having nausea.  Objective:   Vitals:   01/18/19 1015 01/18/19 1034 01/18/19 1102 01/18/19 1225  BP: 127/88 115/64 126/86 118/70  Pulse: (!) 109 (!) 104 90 75  Resp:    17  Temp:    98.4 F (36.9 C)  TempSrc:    Oral  SpO2:    94%  Weight:      Height:        Intake/Output Summary (Last 24 hours) at 01/18/2019 1418 Last data filed at 01/18/2019 1400 Gross per 24 hour  Intake 1990.8 ml  Output 350 ml  Net 1640.8 ml     Wt Readings from Last 3 Encounters:  01/13/19 72.5 kg  01/12/19 72.6 kg  12/14/18 74.3 kg     Exam  General: Alert and oriented x 3, NAD,  ill-appearing  Eyes:   HEENT:  Atraumatic, normocephalic  Cardiovascular: Irregularly irregular, tachycardia  Respiratory: Clear to auscultation bilaterally, no wheezing, rales or rhonchi  Gastrointestinal: Soft, mild diffuse tenderness, nondistended, + bowel sounds  Ext: no pedal edema bilaterally  Neuro: No new deficits  Musculoskeletal: No digital cyanosis, clubbing  Skin: No rashes  Psych: Normal affect and demeanor, alert and oriented x3    Data Reviewed:  I have personally reviewed following labs and imaging studies  Micro Results Recent Results (from the past 240 hour(s))  SARS CORONAVIRUS 2 (TAT 6-24 HRS) Nasopharyngeal Nasopharyngeal Swab     Status: None   Collection Time: 01/13/19  2:01 PM   Specimen: Nasopharyngeal Swab  Result Value Ref Range Status   SARS Coronavirus 2 NEGATIVE NEGATIVE Final    Comment: (NOTE) SARS-CoV-2 target nucleic acids are NOT DETECTED. The SARS-CoV-2 RNA is generally detectable in upper and lower respiratory specimens during the acute phase of infection. Negative results do not preclude SARS-CoV-2 infection, do not rule out co-infections with other pathogens, and should not be used as the sole basis for treatment or other patient management decisions. Negative results must be combined with clinical observations, patient history, and epidemiological information. The expected result is Negative. Fact Sheet for Patients: SugarRoll.be Fact Sheet for Healthcare Providers: https://www.woods-mathews.com/ This test is not yet approved or cleared by the [place] FDA and  has been authorized for detection and/or diagnosis of SARS-CoV-2 by FDA under an Emergency Use Authorization (EUA). This EUA will remain  in effect (meaning this test can be used) for the duration of the COVID-19 declaration under Section 56 4(b)(1) of the Act, 21 U.S.C. section 360bbb-3(b)(1), unless the authorization is  terminated or revoked sooner. Performed at [facility], [place] [address], [place], [place] [address]   Culture, blood (routine x 2)     Status: None (Preliminary result)   Collection Time: 01/15/19 12:06 AM   Specimen: BLOOD LEFT HAND  Result Value Ref Range Status   Specimen Description   Final    BLOOD LEFT HAND Performed at [facility] [address], [place], [place] [address]    Special Requests   Final    BOTTLES DRAWN AEROBIC ONLY Blood Culture results may not be optimal due to an excessive volume of blood received in culture bottles Performed at [facility] [address], [place], [place] [address]    Culture   Final    NO GROWTH 3 DAYS Performed at [facility], [place] [address] [place], [place] [address]    Report Status PENDING  Incomplete  Culture, blood (routine x 2)     Status: None (Preliminary result)   Collection Time: 01/15/19 12:06 AM   Specimen: BLOOD  Result Value Ref Range Status   Specimen Description   Final    BLOOD LEFT ANTECUBITAL Performed at [facility] [address], [place], [place] [address]    Special Requests   Final    BOTTLES DRAWN AEROBIC ONLY Blood Culture results may not be optimal due to an excessive volume of blood received in culture bottles Performed at [facility] [address] [place], [place] [address]    Culture   Final    NO GROWTH 3 DAYS Performed at [facility], [place] [address] [place], [place] [address]    Report Status PENDING  Incomplete    Radiology Reports Ct Abdomen Pelvis W Contrast  Result Date: 01/12/2019 CLINICAL DATA:  Mid low back pain and low abd pain x3days. EXAM: CT ABDOMEN AND PELVIS WITH CONTRAST TECHNIQUE: Multidetector CT imaging of the abdomen and pelvis was performed using the standard protocol following bolus administration of intravenous contrast. CONTRAST:  185mL OMNIPAQUE IOHEXOL 300 MG/ML  SOLN  COMPARISON:  05/20/2017 FINDINGS: Lower chest: There is scarring within the RIGHT middle lobe and lung bases. Heart size is normal. Hepatobiliary: Scattered calcified nodules are identified within the liver,  consistent with remote granulomatous disease. Focal fatty infiltration identified adjacent to the falciform ligament. Status post cholecystectomy. There is prominence of the common bile duct, measuring 11 millimeters, likely secondary to cholecystectomy. Pancreas: Unremarkable. No pancreatic ductal dilatation or surrounding inflammatory changes. Spleen: Normal in size without focal abnormality. Adrenals/Urinary Tract: Adrenal glands are normal in appearance. Subcentimeter low-attenuation lesions within both kidneys are likely cysts. No hydronephrosis. Courses of the ureters are partially obscured by metallic artifact in the pelvis. The bladder and visualized portion of the urethra are normal. Stomach/Bowel: Small hiatal hernia normal. Stomach is normal in appearance. Small bowel loops are normal caliber. The appendix is well seen and has a normal appearance. There are numerous colonic diverticula. There is circumferential thickening of the proximal sigmoid segment, associated with sigmoid mesenteric edema. Numerous diverticular are identified in this region and findings are consistent with acute diverticulitis. No evidence for perforation. No abscess. Vascular/Lymphatic: There is dense atherosclerotic calcification of the abdominal aorta. Although involved by atherosclerosis, there is vascular opacification of the celiac axis, superior mesenteric artery, and inferior mesenteric artery. Normal appearance of the portal venous system and inferior vena cava. No retroperitoneal or mesenteric adenopathy. Reproductive: Prior hysterectomy. No adnexal mass. Other: Small fat containing paraumbilical hernia. No ascites. Musculoskeletal: RIGHT hip arthroplasty and associated significant artifact in the pelvis. Status post  posterior fusion with interbody fusion devices at L2-L4. Prior vertebral augmentation at T11 and L1. Significant degenerative changes throughout the lumbar spine. IMPRESSION: 1. Findings are consistent with acute sigmoid diverticulitis. No evidence for perforation or abscess. 2. Status post cholecystectomy and hysterectomy. 3. Small hiatal hernia. 4. Small fat containing paraumbilical hernia. 5. Postoperative and degenerative changes in the lumbar spine. 6. Aortic Atherosclerosis (ICD10-I70.0). Electronically Signed   By: Nolon Nations M.D.   On: 01/12/2019 14:39   Dg Chest Port 1 View  Result Date: 01/13/2019 CLINICAL DATA:  Dyspnea. EXAM: PORTABLE CHEST 1 VIEW COMPARISON:  11/25/2016 FINDINGS: Normal heart size. Lungs are hypoinflated. No pleural effusion identified. Linear scar like densities versus parenchymal banding identified within the left midlung and left base. No airspace opacities identified. IMPRESSION: 1. No acute cardiopulmonary abnormalities. 2. Linear scar like densities in the left midlung and left base. Electronically Signed   By: Kerby Moors M.D.   On: 01/13/2019 17:10    Lab Data:  CBC: Recent Labs  Lab 01/12/19 1136 01/13/19 1230 01/14/19 0325 01/15/19 0006 01/16/19 0250 01/17/19 0403 01/18/19 0521  WBC 11.9* 10.4 9.9 10.5 15.5* 13.7* 10.1  NEUTROABS 8.5* 7.6  --   --   --   --   --   HGB 12.3 11.3* 10.9* 9.6* 10.3* 9.8* 10.2*  HCT 38.5 36.2 34.6* 31.3* 33.0* 31.6* 31.9*  MCV 94.6 95.3 95.8 96.3 95.4 94.0 93.8  PLT 175 194 146* 162 176 195 [id number]   Basic Metabolic Panel: Recent Labs  Lab 01/14/19 0325 01/15/19 0006 01/16/19 0250 01/17/19 0403 01/18/19 0521  NA 140 142 139 139 137  K 4.0 3.8 3.5 3.8 3.6  CL 105 109 107 107 106  CO2 20* 20* 23 23 21*  GLUCOSE 84 114* 147* 103* 101*  BUN 12 7* 7* 15 22  CREATININE 1.50* 1.10* 0.77 0.93 0.91  CALCIUM 8.5* 8.9 8.8* 8.4* 8.0*   GFR: Estimated Creatinine Clearance: 47 mL/min (by C-G formula based on SCr  of 0.91 mg/dL). Liver Function Tests: Recent Labs  Lab 01/12/19 1136 01/13/19 1230  AST 16 44*  ALT 14 51*  ALKPHOS 77 141*  BILITOT 2.0* 2.7*  PROT 6.8 6.7  ALBUMIN 3.7 3.4*   Recent Labs  Lab 01/12/19 1136 01/13/19 1230  LIPASE 30 34   No results for input(s): AMMONIA in the last 168 hours. Coagulation Profile: No results for input(s): INR, PROTIME in the last 168 hours. Cardiac Enzymes: No results for input(s): CKTOTAL, CKMB, CKMBINDEX, TROPONINI in the last 168 hours. BNP (last 3 results) No results for input(s): PROBNP in the last 8760 hours. HbA1C: No results for input(s): HGBA1C in the last 72 hours. CBG: No results for input(s): GLUCAP in the last 168 hours. Lipid Profile: No results for input(s): CHOL, HDL, LDLCALC, TRIG, CHOLHDL, LDLDIRECT in the last 72 hours. Thyroid Function Tests: No results for input(s): TSH, T4TOTAL, FREET4, T3FREE, THYROIDAB in the last 72 hours. Anemia Panel: No results for input(s): VITAMINB12, FOLATE, FERRITIN, TIBC, IRON, RETICCTPCT in the last 72 hours. Urine analysis:    Component Value Date/Time   COLORURINE YELLOW 05/20/2017 1444   APPEARANCEUR CLOUDY (A) 05/20/2017 1444   LABSPEC 1.041 (H) 05/20/2017 1444   PHURINE 5.0 05/20/2017 1444   GLUCOSEU NEGATIVE 05/20/2017 1444   GLUCOSEU NEGATIVE 07/31/2016 1018   HGBUR MODERATE (A) 05/20/2017 1444   BILIRUBINUR NEGATIVE 05/20/2017 1444   BILIRUBINUR neg 07/31/2016 1012   KETONESUR NEGATIVE 05/20/2017 1444   PROTEINUR 30 (A) 05/20/2017 1444   UROBILINOGEN 0.2 07/31/2016 1018   NITRITE POSITIVE (A) 05/20/2017 1444   LEUKOCYTESUR LARGE (A) 05/20/2017 1444     Komal Stangelo M.D. [facility] 01/18/2019, 2:18 PM  Pager: [phone] Between 7am to 7pm - call Pager - [phone]  After 7pm go to [email or web address] - password [email or web address]  Call night coverage person covering after 7pm

## 2019-01-18 NOTE — Telephone Encounter (Signed)
Spoke with patient's daughter. She wanted to update Dr. Angelena Form. Pt went into [facility] for diverticulitis infection several days ago and last night went into rapid afib. She was started on cardizem and may be switching to amiodarone this afternoon. Her concern is that cardiology has not been consulted.  And the patient told them her cardiologist was in [place].  She has asked for hospitalist to call her and is still waiting to hear.  I assured her that the people caring for her at [facility] can see that she is established with Dr. Angelena Form in cardiology and the hospitalist is who determines if cardiology needs consulted.   I suggested she call back and speak with the nurse for an update on her mom and ask again for call from physician.  Adv that I will pass along the information to Dr. Angelena Form.

## 2019-01-18 NOTE — Progress Notes (Signed)
Pt became acutely confused at shift change. Unable to tell accurate date of birth, year, or current president. Pt was previously AxOx4 throughout whole 7a-7p shift. Pt also began to hallucinate  stating that she was seeing "beach towels and tricycles" on the wall.   Vital signs stable. Bilateral grips in tact. Facial symmetry present. No facial droop.   NP on call made aware.   AC made aware and gave permission for daughter to stay over night to help reorient the patient.

## 2019-01-18 NOTE — Progress Notes (Signed)
Spoke with patients daughter, Betty Jackson about the visitor policy. Daughter states that she understood that she was allowed to stay at patients admission d/t patients confusion but understands that she is no longer allowed to stay past visiting hours. Daughter states that if she comes today it will be before 8pm.

## 2019-01-18 NOTE — TOC Initial Note (Signed)
Transition of Care [facility]) - Initial/Assessment Note    Patient Details  Name: Betty Jackson MRN: [id number] Date of Birth: [birth year]  Transition of Care [facility]) CM/SW Contact:    Dessa Phi, RN Phone Number: 01/18/2019, 3:30 PM  Clinical Narrative: Patient agree to [facility] chosen & accepted rep Tommi Rumps following for [facility] orders.                  Expected Discharge Plan: [place] Barriers to Discharge: Continued Medical Work up   Patient Goals and CMS Choice Patient states their goals for this hospitalization and ongoing recovery are:: go home CMS Medicare.gov Compare Post Acute Care list provided to:: Patient Choice offered to / list presented to : Patient  Expected Discharge Plan and Services Expected Discharge Plan: [place]   Discharge Planning Services: CM Consult Post Acute Care Choice: [place] arrangements for the past 2 months: [place]: PT [place]: [place] Date [place]: 01/18/19 Time HH Agency Contacted: [id number] Representative spoke with at [place]: Tommi Rumps  Prior Living Arrangements/Services Living arrangements for the past 2 months: [place] with:: Spouse Patient language and need for interpreter reviewed:: Yes Do you feel safe going back to the place where you live?: Yes      Need for Family Participation in Patient Care: No (Comment) Care giver support system in place?: Yes (comment) Current home services: DME(rw,3n1) Criminal Activity/Legal Involvement Pertinent to Current Situation/Hospitalization: No - Comment as needed  Activities of Daily Living Home Assistive Devices/Equipment: Eyeglasses, Environmental consultant (specify type)(4 wheeled walker) ADL Screening (condition at time of admission) Patient's cognitive ability adequate to safely complete daily activities?: Yes Is the patient deaf or have difficulty hearing?:  No Does the patient have difficulty seeing, even when wearing glasses/contacts?: No Does the patient have difficulty concentrating, remembering, or making decisions?: No Patient able to express need for assistance with ADLs?: Yes Does the patient have difficulty dressing or bathing?: Yes Independently performs ADLs?: No Communication: Independent Dressing (OT): Needs assistance Is this a change from baseline?: Change from baseline, expected to last <3days Grooming: Needs assistance Is this a change from baseline?: Change from baseline, expected to last <3 days Feeding: Needs assistance Is this a change from baseline?: Change from baseline, expected to last <3 days Bathing: Needs assistance Is this a change from baseline?: Change from baseline, expected to last <3 days Toileting: Needs assistance Is this a change from baseline?: Change from baseline, expected to last <3 days In/Out Bed: Needs assistance Is this a change from baseline?: Change from baseline, expected to last <3 days Walks in Home: Needs assistance Is this a change from baseline?: Change from baseline, expected to last <3 days Does the patient have difficulty walking or climbing stairs?: Yes Weakness of Legs: Both Weakness of Arms/Hands: None  Permission Sought/Granted Permission sought to share information with : Case Manager Permission granted to share information with : Yes, Verbal Permission Granted  Share Information with NAMEJuliann Jackson [facility] [id number]  Permission granted to share info w AGENCY: [name]        Emotional Assessment Appearance:: Appears stated age Attitude/Demeanor/Rapport: Gracious Affect (typically observed): Accepting Orientation: : Oriented to Self, Oriented to Place, Oriented to  Time, Oriented to Situation Alcohol / Substance  Use: Not Applicable Psych Involvement: No (comment)  Admission diagnosis:  Diverticulitis [K57.92] Dyspnea [R06.00] Nausea vomiting and diarrhea [R11.2,  R19.7] Acute diverticulitis [K57.92] Patient Active Problem List   Diagnosis Date Noted  . Nausea 01/17/2019  . Acute diverticulitis 01/13/2019  . AKI (acute kidney injury) ([place]) 01/13/2019  . Chronic pain 01/13/2019  . Tachycardia 01/13/2019  . Hypokalemia 01/13/2019  . Chronic low back pain 07/10/2018  . Influenza A 05/20/2017  . Sepsis ([place]) 05/20/2017  . Acute respiratory failure with hypoxia ([place]) 05/20/2017  . CKD (chronic kidney disease) stage 3, GFR 30-59 ml/min 05/20/2017  . PAF (paroxysmal atrial fibrillation) ([place]) 05/20/2017  . HTN (hypertension) 05/20/2017  . Giant cell arteritis ([place]) 05/20/2017  . Coronary disease 05/20/2017  . Abnormal urinalysis 05/20/2017  . Osteoporosis 05/08/2016  . Herniated nucleus pulposus, L5-S1, right 11/04/2015  . Major depressive disorder, recurrent episode, moderate ([place]) 07/16/2013  . Abdominal pain, epigastric 06/22/2013  . Lumbar stenosis 04/25/2013  . Chronic insomnia 02/07/2013  . Elevated transaminase level 02/07/2013  . CVA (cerebral infarction) 10/29/2012  . Visual changes 10/28/2012  . Depression 10/28/2012  . Anxiety state, unspecified 10/28/2012  . Anemia, unspecified 10/28/2012  . Nonspecific (abnormal) findings on radiological and other examination of gastrointestinal tract 01/25/2012  . Hyponatremia 01/24/2012  . Hematuria 01/24/2012  . Enteritis 12/24/2011  . Abdominal pain, other specified site 12/24/2011  . Leg pain, right 02/18/2011  . Temporal arteritis ([place]) 09/01/2010  . Hypertension 09/01/2010  . Hyperlipidemia 09/01/2010  . History of atrial fibrillation 09/01/2010  . CAD (coronary artery disease) 09/01/2010  . GERD (gastroesophageal reflux disease) 09/01/2010  . Insomnia 09/01/2010   PCP:  Eulas Post, MD Pharmacy:   [id number] [address], [place] [address] Phone: [phone] Fax: [place], [place] [address] Phone: [phone] Fax: [phone]     Social Determinants of Health (SDOH) Interventions    Readmission Risk Interventions No flowsheet data found.

## 2019-01-18 NOTE — Progress Notes (Signed)
Physical Therapy Treatment Patient Details Name: Betty Jackson MRN: [id number] DOB: 02/12/1935 Today's Date: 01/18/2019    History of Present Illness 84 yo female admitted with diverticulitis. Hx of RA, A fib, CAD, LBP, scoliosis, LE edema, DDD, CVA, CKD.    PT Comments    Patient resting in chair at start of session and requesting to get back to bed; pt agreeable to attempt walking short distance. She continues to require cues for safe hand placement and management of RW. She requires min assist to steady throughout and was unsafe with walker during turns requiring assist for safe positioning. Patient's HR has been elevated throughout the day and was ~98-104 at rest. It increased to ~110-118 with initial mobility and the max it reached was 147 bpm. After sitting pt reported some dizziness that resolved after returning to supine, her HR reduced to 112 bpm as well. She will continue to benefit from skilled PT interventions to address impairments and progress functional independence with mobility.   Follow Up Recommendations  Home health PT;Supervision/Assistance - 24 hour(pt and family prefer home. daughter stated she can stay with pt)     Equipment Recommendations  3in1 (PT)    Recommendations for Other Services       Precautions / Restrictions Precautions Precautions: Fall Restrictions Weight Bearing Restrictions: No    Mobility  Bed Mobility Overal bed mobility: Needs Assistance Bed Mobility: Sit to Supine       Sit to supine: Min assist;HOB elevated   General bed mobility comments: pt able to raise LE's into bed without assist, pt slow with transfer  Transfers Overall transfer level: Needs assistance Equipment used: Rolling walker (2 wheeled) Transfers: Sit to/from Stand Sit to Stand: Min guard         General transfer comment: cues for safe hand placement and technique, no assist required to initiate power up, close guard for  safety  Ambulation/Gait Ambulation/Gait assistance: Min assist Gait Distance (Feet): 40 Feet Assistive device: 4-wheeled walker Gait Pattern/deviations: Step-through pattern;Decreased stride length Gait velocity: decreased   General Gait Details: cues for hand placement and for safe use to maintain closer proximity to walker, assist required intermittantly to bring walker closer to BOS and to steady   Stairs             Wheelchair Mobility    Modified Rankin (Stroke Patients Only)       Balance Overall balance assessment: Needs assistance Sitting-balance support: Feet supported;Feet unsupported Sitting balance-[name]: Good     Standing balance support: Bilateral upper extremity supported Standing balance-[name]: Poor           Cognition Arousal/Alertness: Awake/alert Behavior During Therapy: WFL for tasks assessed/performed Overall Cognitive Status: Within Functional Limits for tasks assessed                                        Exercises      General Comments        Pertinent Vitals/Pain Pain Assessment: No/denies pain           PT Goals (current goals can now be found in the care plan section) Acute Rehab PT Goals Patient Stated Goal: home PT Goal Formulation: With patient/family Time For Goal Achievement: 01/30/19 Potential to Achieve Goals: Good Progress towards PT goals: Progressing toward goals    Frequency    Min 3X/week      PT Plan  Current plan remains appropriate       AM-PAC PT "6 Clicks" Mobility   Outcome Measure  Help needed turning from your back to your side while in a flat bed without using bedrails?: A Little Help needed moving from lying on your back to sitting on the side of a flat bed without using bedrails?: A Little Help needed moving to and from a bed to a chair (including a wheelchair)?: A Little Help needed standing up from a chair using your arms (e.g., wheelchair or bedside  chair)?: A Little Help needed to walk in hospital room?: A Little Help needed climbing 3-5 steps with a railing? : A Lot 6 Click Score: 17    End of Session Equipment Utilized During Treatment: Gait belt Activity Tolerance: Patient tolerated treatment well Patient left: in bed;with call bell/phone within reach;with bed alarm set Nurse Communication: Mobility status PT Visit Diagnosis: Unsteadiness on feet (R26.81);Muscle weakness (generalized) (M62.81);Pain;Difficulty in walking, not elsewhere classified (R26.2)     Time: [id number] PT Time Calculation (min) (ACUTE ONLY): 17 min  Charges:  $Gait Training: 8-22 mins                     Kipp Brood, PT, DPT Physical Therapist with [facility]  01/18/2019 3:36 PM

## 2019-01-18 NOTE — TOC Progression Note (Signed)
Transition of Care [facility]) - Progression Note    Patient Details  Name: Betty Jackson MRN: [id number] Date of Birth: 1934/08/28  Transition [facility]) CM/SW Contact  Maira Christon, [name], RN Phone Number: 01/18/2019, 1:57 PM  Clinical Narrative:   01/18/2019 Medicare Home health results [email or web address] ASC&paging=118 1/2 Close window Home health agencies that serve [address]. [place] Quality of Patient Care Rating Patient Survey Summary Rating [facility] [phone] [place] [phone] [place] [phone] [place] [phone] [place] [phone] [place] [phone] [facility] [phone] [facility] [phone] [place] [phone] [facility] [phone] [place] [phone] 4 out of 5 stars 4 out of 5 stars 3 out of 5 stars 4 out of 5 stars 4  out of 5 stars 3 out of 5 stars 4  out of 5 stars 4 out of 5 stars 4 out of 5 stars 4 out of 5 stars 4 out of 5 stars 4 out of 5 stars 3  out of 5 stars 4 out of 5 stars 2  out of 5 stars 3 out of 5 stars 3 out of 5 stars 4 out of 5 stars 4 out of 5 stars 3 out of 5 stars 4 out of 5 stars 01/18/2019 Medicare Home health results [email or web address] ASC&paging=118 2/2 [place] of Patient Care Rating Patient Survey Summary Rating [facility] [place] ([phone]) [phone] Not Available5 [place] [phone] [place] [phone] [facility] ([phone])  [phone] [facility] [phone] [place] [phone] Not [place] [phone] [place] number Footnote as displayed on [place] 3  out of 5 stars 3 out of 5 stars 5 out of 5 stars 4 out of 5 stars 3  out of 5 stars 4 out of 5 stars 3  out of 5 stars 3 out of 5 stars 3  out of 5 stars 4  out of 5 stars 3 out of 5 stars         Expected Discharge Plan and Services                                                 Social Determinants of Health (SDOH) Interventions    Readmission Risk Interventions No flowsheet data found.

## 2019-01-18 NOTE — Telephone Encounter (Signed)
Thanks

## 2019-01-18 NOTE — Progress Notes (Signed)
Pt transferred from 5th floor at 0100. Tele monitor continues to read A Fib with a rate of 140-160. NP on call, Schorr, paged and one time dose of metoprolol ordered and administered. Pt's HR continuing to sustain 110-140. NP again paged with no new orders. At 0430, NP paged for HR again 140-160. One time dose of digoxin ordered and administered with no improvement. Cardizem then ordered and administered. NP recommends, if no response from cardizem, to consider placing pt on a drip. Pt and daughter educated on medications given throughout the night. Will continue to monitor closely.

## 2019-01-19 DIAGNOSIS — I4891 Unspecified atrial fibrillation: Secondary | ICD-10-CM

## 2019-01-19 DIAGNOSIS — K5792 Diverticulitis of intestine, part unspecified, without perforation or abscess without bleeding: Secondary | ICD-10-CM

## 2019-01-19 LAB — COMPREHENSIVE METABOLIC PANEL
ALT: 12 U/L (ref 0–44)
AST: 13 U/L — ABNORMAL LOW (ref 15–41)
Albumin: 2.5 g/dL — ABNORMAL LOW (ref 3.5–5.0)
Alkaline Phosphatase: 60 U/L (ref 38–126)
Anion gap: 9 (ref 5–15)
BUN: 14 mg/dL (ref 8–23)
CO2: 21 mmol/L — ABNORMAL LOW (ref 22–32)
Calcium: 8.3 mg/dL — ABNORMAL LOW (ref 8.9–10.3)
Chloride: 108 mmol/L (ref 98–111)
Creatinine, Ser: 0.78 mg/dL (ref 0.44–1.00)
GFR calc Af Amer: >60 mL/min (ref >60)
GFR calc non Af Amer: >60 mL/min (ref >60)
Glucose, Bld: 123 mg/dL — ABNORMAL HIGH (ref 70–99)
Potassium: 3.3 mmol/L — ABNORMAL LOW (ref 3.5–5.1)
Sodium: 138 mmol/L (ref 135–145)
Total Bilirubin: 0.3 mg/dL (ref 0.3–1.2)
Total Protein: 5.2 g/dL — ABNORMAL LOW (ref 6.5–8.1)

## 2019-01-19 LAB — MAGNESIUM: Magnesium: 1.8 mg/dL (ref 1.7–2.4)

## 2019-01-19 LAB — TSH: TSH: 3.884 u[IU]/mL (ref 0.350–4.500)

## 2019-01-19 MED ORDER — METOPROLOL TARTRATE 50 MG PO TABS: 75.0000 mg | ORAL_TABLET | Freq: Two times a day (BID) | ORAL | 3 refills | Status: DC

## 2019-01-19 MED ORDER — POTASSIUM CHLORIDE CRYS ER 20 MEQ PO TBCR: 20.0000 meq | EXTENDED_RELEASE_TABLET | Freq: Every day | ORAL | 1 refills | Status: DC

## 2019-01-19 MED ORDER — FUROSEMIDE 40 MG PO TABS: ORAL_TABLET | ORAL | 3 refills | Status: DC

## 2019-01-19 MED ORDER — CIPROFLOXACIN HCL 500 MG PO TABS: 500.0000 mg | ORAL_TABLET | Freq: Two times a day (BID) | ORAL | 0 refills | Status: DC

## 2019-01-19 MED ORDER — DILTIAZEM HCL 30 MG PO TABS: 30.0000 mg | ORAL_TABLET | Freq: Three times a day (TID) | ORAL | 0 refills | Status: DC

## 2019-01-19 MED ORDER — FUROSEMIDE 40 MG PO TABS
40.0000 mg | ORAL_TABLET | Freq: Every day | ORAL | Status: DC
  Administered 2019-01-19 – 2019-01-20 (×2): 40 mg via ORAL
  Filled 2019-01-19 (×2): qty 1

## 2019-01-19 MED ORDER — METOPROLOL TARTRATE 50 MG PO TABS
75.0000 mg | ORAL_TABLET | Freq: Two times a day (BID) | ORAL | Status: DC
  Administered 2019-01-19 – 2019-01-20 (×2): 75 mg via ORAL
  Filled 2019-01-19 (×2): qty 1

## 2019-01-19 MED ORDER — DILTIAZEM HCL 30 MG PO TABS
30.0000 mg | ORAL_TABLET | Freq: Three times a day (TID) | ORAL | Status: DC
  Administered 2019-01-19 – 2019-01-20 (×3): 30 mg via ORAL
  Filled 2019-01-19 (×3): qty 1

## 2019-01-19 MED ORDER — LOSARTAN POTASSIUM 25 MG PO TABS: 25.0000 mg | ORAL_TABLET | Freq: Every day | ORAL | Status: DC

## 2019-01-19 MED ORDER — METOPROLOL TARTRATE 25 MG PO TABS
75.0000 mg | ORAL_TABLET | Freq: Two times a day (BID) | ORAL | Status: DC
  Filled 2019-01-19: qty 3

## 2019-01-19 MED ORDER — METRONIDAZOLE 500 MG PO TABS: 500.0000 mg | ORAL_TABLET | Freq: Three times a day (TID) | ORAL | 0 refills | Status: DC

## 2019-01-19 MED ORDER — METOPROLOL TARTRATE 50 MG PO TABS
50.0000 mg | ORAL_TABLET | Freq: Two times a day (BID) | ORAL | Status: DC
  Administered 2019-01-19: 50 mg via ORAL
  Filled 2019-01-19: qty 1

## 2019-01-19 MED ORDER — LOSARTAN POTASSIUM 100 MG PO TABS: 100.0000 mg | ORAL_TABLET | Freq: Every day | ORAL | 1 refills | Status: DC

## 2019-01-19 NOTE — Discharge Summary (Signed)
Physician Discharge Summary   Patient ID: Betty Jackson MRN: [id number] DOB/AGE: May 05, 1934 84 y.o.  Admit date: 01/13/2019 Discharge date: 01/19/2019  Primary Care Physician:  Eulas Post, MD   Recommendations for Outpatient Follow-up:  1. Follow up with PCP in 1-2 weeks 2. Cardizem added 30mg  PO q 8hours, until follow-up  3. Losartan on hold until follow-up 4. Continue Metoprolol 75mg  PO BID.   Home Health: Home Health PT, OT  Equipment/Devices: DME 3n1  Discharge Condition: stable  CODE STATUS: DNR   Diet recommendation: heart healthy    Discharge Diagnoses:    . Acute diverticulitis . Temporal arteritis ([place]) . Atrial fibrillation with RVR  ([place]) . Lumbar stenosis . AKI (acute kidney injury) ([place]) . Chronic pain . Tachycardia . Hypokalemia . Nausea   Consults: Cardiology    Allergies:   Allergies  Allergen Reactions  . Dextromethorphan Rash     DISCHARGE MEDICATIONS: Allergies as of 01/19/2019      Reactions   Dextromethorphan Rash      Medication List    STOP taking these medications   losartan 100 MG tablet Commonly known as: COZAAR     TAKE these medications   cholecalciferol 1000 units tablet Commonly known as: VITAMIN D Take 1,000 Units by mouth daily.   ciprofloxacin 500 MG tablet Commonly known as: CIPRO Take 1 tablet (500 mg total) by mouth every 12 (twelve) hours for 7 days.   dicyclomine 20 MG tablet Commonly known as: BENTYL Take 1 tablet (20 mg total) by mouth 2 (two) times daily as needed for spasms.   diltiazem 30 MG tablet Commonly known as: CARDIZEM Take 1 tablet (30 mg total) by mouth every 8 (eight) hours. Please hold if SBP <100 or HR <50.   Eliquis 5 MG Tabs tablet Generic drug: apixaban TAKE 1 TABLET BY MOUTH TWICE A DAY What changed: how much to take   escitalopram 10 MG tablet Commonly known as: LEXAPRO TAKE 1 TABLET BY MOUTH EVERY DAY   furosemide 40 MG tablet Commonly known as: LASIX Take 1  tablet (40mg ) by mouth twice a day and an extra 1/2 tablet (20mg ) twice a day as needed for edema. Start taking on: January 20, 2019   HYDROcodone-acetaminophen 5-325 MG tablet Commonly known as: NORCO/VICODIN Take one tablet by mouth every 8 hours as needed for pain. May refill in two months. What changed:   how much to take  how to take this  when to take this  reasons to take this  additional instructions   lidocaine 5 % Commonly known as: Lidoderm Place 1 patch onto the skin daily. Remove & Discard patch within 12 hours or as directed by MD   metoprolol tartrate 50 MG tablet Commonly known as: LOPRESSOR Take 1.5 tablets (75 mg total) by mouth 2 (two) times daily. What changed: See the new instructions.   metroNIDAZOLE 500 MG tablet Commonly known as: FLAGYL Take 1 tablet (500 mg total) by mouth 3 (three) times daily for 7 days.   ondansetron 4 MG disintegrating tablet Commonly known as: Zofran ODT Take 1 tablet (4 mg total) by mouth every 8 (eight) hours as needed for nausea or vomiting.   pantoprazole 40 MG tablet Commonly known as: PROTONIX Take 1 tablet (40 mg total) by mouth daily.   potassium chloride SA 20 MEQ tablet Commonly known as: KLOR-CON Take 1 tablet (20 mEq total) by mouth daily. Start taking on: January 20, 2019   predniSONE 10 MG tablet Commonly  known as: DELTASONE 4 tablets for 3 days, 3 tablets for 3 days, 2 tablets for 3 days, 1 tablet continue What changed:   how much to take  how to take this  when to take this  additional instructions   vitamin C 500 MG tablet Commonly known as: ASCORBIC ACID Take 1,000 mg by mouth daily as needed (for sore throat).   vitamin E 400 UNIT capsule Generic drug: vitamin E Take 400 Units by mouth daily.            Durable Medical Equipment  (From admission, onward)         Start     Ordered   01/19/19 1547  For home use only DME 3 n 1  Once     01/19/19 1546           Brief H  and P: For complete details please refer to admission H and P, but in brief Patient is a 84 year old female with history of CAD status post PTCA, chronic low back pain, CKD stage III, GERD, CVA, history of paroxysmal A. fib on Eliquis, history of rheumatoid arthritis/temporal arteritis on chronic prednisone, anxiety/depression and prior history of diverticulitis presented on 10/16 with nausea, emesis and diarrhea.  CT of the abdomen and pelvis showed acute diverticulitis, was seen at [facility] ED on 10/15.  Patient returned to our ED with persistent abdominal pain diminished oral intake and inability to tolerate oral antibiotics. In ED T-max 102.2 F, heart rate 103, BP 104/56.  COVID-19 negative Patient was placed on IV antibiotics, pain control and admitted for further work-up. [facility] Course:  Acute diverticulitis : Uncomplicated without abscess or perforation, SIRS -Nausea and vomiting improved, now tolerating diet and oral antibiotics. - cont ciprofloxacin and Flagyl x 7days to complete full course (patient was started outpatient)   Atrial fibrillation with RVR -On 10/21 overnight, patient was noted to be in RVR, atrial fibrillation confirmed on EKG, heart rate in 140s to 150s.  Received digoxin, Lopressor, Cardizem bolus x2 with no significant improvement, hence placed on cardizem drip.  -Continue Eliquis and beta-blocker, resumed at 75mg  BID. -Cardiology was consulted. - cardiology drip weaned off. HR on ambulation still bit high, Dr Oval Linsey recommended adding short acting cardizem 30mg  q hours.   AKI on CKD stage III -Prerenal likely due to diminished oral intake, persistent nausea, emesis -Patient presented with creatinine of 1.5, now improved with IV fluid hydration -Cr 0.78 at discharge - lasix was resumed at 40mg  daily  Essential hypertension BP some what elevated, resumed lasix and added cardizem.  Losartan on hold until follow-up with cardiology outpatient   History  of rheumatoid arthritis, temporal arteritis -Continue chronic prednisone  History of anxiety, depression Continue Lexapro  Chronic low back pain -Continue pain control as home regimen  Chronic normocytic anemia H&H stable  GERD -Speech evaluated with no concerns for dysphagia.  Continue PPI  Generalized debility -Appears to be very weak and debilitated, PT recommended HHPT  Dementia with sundowning: - daughter requested discharge home today as patient has sundowning in the hospital and does well in familiar surroundings.   Day of Discharge  S: Heart rate controlled, feels better. No CP or SOB  BP (!) 181/100 (BP Location: Left Arm)   Pulse 81   Temp 98.4 F (36.9 C) (Oral)   Resp 19   Ht 5' 4.5" (1.638 m)   Wt 72.5 kg   SpO2 96%   BMI 27.01 kg/m   Physical  Exam: General: Alert and awake oriented x3 not in any acute distress. HEENT: anicteric sclera, pupils reactive to light and accommodation CVS: S1-S2 clear no murmur rubs or gallops Chest: clear to auscultation bilaterally, no wheezing rales or rhonchi Abdomen: soft nontender, nondistended, normal bowel sounds Extremities: no cyanosis, clubbing or edema noted bilaterally Neuro: Cranial nerves II-XII intact, no focal neurological deficits   The results of significant diagnostics from this hospitalization (including imaging, microbiology, ancillary and laboratory) are listed below for reference.      Procedures/Studies:  Ct Abdomen Pelvis W Contrast  Result Date: 01/12/2019 CLINICAL DATA:  Mid low back pain and low abd pain x3days. EXAM: CT ABDOMEN AND PELVIS WITH CONTRAST TECHNIQUE: Multidetector CT imaging of the abdomen and pelvis was performed using the standard protocol following bolus administration of intravenous contrast. CONTRAST:  170mL OMNIPAQUE IOHEXOL 300 MG/ML  SOLN COMPARISON:  05/20/2017 FINDINGS: Lower chest: There is scarring within the RIGHT middle lobe and lung bases. Heart size is  normal. Hepatobiliary: Scattered calcified nodules are identified within the liver, consistent with remote granulomatous disease. Focal fatty infiltration identified adjacent to the falciform ligament. Status post cholecystectomy. There is prominence of the common bile duct, measuring 11 millimeters, likely secondary to cholecystectomy. Pancreas: Unremarkable. No pancreatic ductal dilatation or surrounding inflammatory changes. Spleen: Normal in size without focal abnormality. Adrenals/Urinary Tract: Adrenal glands are normal in appearance. Subcentimeter low-attenuation lesions within both kidneys are likely cysts. No hydronephrosis. Courses of the ureters are partially obscured by metallic artifact in the pelvis. The bladder and visualized portion of the urethra are normal. Stomach/Bowel: Small hiatal hernia normal. Stomach is normal in appearance. Small bowel loops are normal caliber. The appendix is well seen and has a normal appearance. There are numerous colonic diverticula. There is circumferential thickening of the proximal sigmoid segment, associated with sigmoid mesenteric edema. Numerous diverticular are identified in this region and findings are consistent with acute diverticulitis. No evidence for perforation. No abscess. Vascular/Lymphatic: There is dense atherosclerotic calcification of the abdominal aorta. Although involved by atherosclerosis, there is vascular opacification of the celiac axis, superior mesenteric artery, and inferior mesenteric artery. Normal appearance of the portal venous system and inferior vena cava. No retroperitoneal or mesenteric adenopathy. Reproductive: Prior hysterectomy. No adnexal mass. Other: Small fat containing paraumbilical hernia. No ascites. Musculoskeletal: RIGHT hip arthroplasty and associated significant artifact in the pelvis. Status post posterior fusion with interbody fusion devices at L2-L4. Prior vertebral augmentation at T11 and L1. Significant degenerative  changes throughout the lumbar spine. IMPRESSION: 1. Findings are consistent with acute sigmoid diverticulitis. No evidence for perforation or abscess. 2. Status post cholecystectomy and hysterectomy. 3. Small hiatal hernia. 4. Small fat containing paraumbilical hernia. 5. Postoperative and degenerative changes in the lumbar spine. 6. Aortic Atherosclerosis (ICD10-I70.0). Electronically Signed   By: Nolon Nations M.D.   On: 01/12/2019 14:39   Dg Chest Port 1 View  Result Date: 01/13/2019 CLINICAL DATA:  Dyspnea. EXAM: PORTABLE CHEST 1 VIEW COMPARISON:  11/25/2016 FINDINGS: Normal heart size. Lungs are hypoinflated. No pleural effusion identified. Linear scar like densities versus parenchymal banding identified within the left midlung and left base. No airspace opacities identified. IMPRESSION: 1. No acute cardiopulmonary abnormalities. 2. Linear scar like densities in the left midlung and left base. Electronically Signed   By: Kerby Moors M.D.   On: 01/13/2019 17:10      LAB RESULTS: Basic Metabolic Panel: Recent Labs  Lab 01/18/19 0521 01/19/19 0929  NA 137 138  K 3.6 3.3*  CL 106 108  CO2 21* 21*  GLUCOSE 101* 123*  BUN 22 14  CREATININE 0.91 0.78  CALCIUM 8.0* 8.3*  MG  --  1.8   Liver Function Tests: Recent Labs  Lab 01/13/19 1230 01/19/19 0929  AST 44* 13*  ALT 51* 12  ALKPHOS 141* 60  BILITOT 2.7* 0.3  PROT 6.7 5.2*  ALBUMIN 3.4* 2.5*   Recent Labs  Lab 01/13/19 1230  LIPASE 34   No results for input(s): AMMONIA in the last 168 hours. CBC: Recent Labs  Lab 01/13/19 1230  01/17/19 0403 01/18/19 0521  WBC 10.4   < > 13.7* 10.1  NEUTROABS 7.6  --   --   --   HGB 11.3*   < > 9.8* 10.2*  HCT 36.2   < > 31.6* 31.9*  MCV 95.3   < > 94.0 93.8  PLT 194   < > 195 220   < > = values in this interval not displayed.   Cardiac Enzymes: No results for input(s): CKTOTAL, CKMB, CKMBINDEX, TROPONINI in the last 168 hours. BNP: Invalid input(s): POCBNP CBG: No  results for input(s): GLUCAP in the last 168 hours.    Disposition and Follow-up: Discharge Instructions    Diet - low sodium heart healthy   Complete by: As directed    Discharge instructions   Complete by: As directed    Please start Lasix tomorrow 10/23, 40 mg daily till the weekend and then resume home regimen.  Follow-up with Dr. Angelena Form.  Continue to hold losartan.   Increase activity slowly   Complete by: As directed        DISPOSITION: *home    [place], [facility] Follow up.   Specialty: Home Health Services Why: [place] physical therapy/occupational therapy/nursing Contact information: [address] [place] [address] [phone]        Eulas Post, MD. Schedule an appointment as soon as possible for a visit in 2 week(s).   Specialty: Family Medicine Contact information: [place] [address] [phone]        [name] Follow up.   Specialty: Cardiology Why: You have a follow-up in our [facility] on Monday 01/23/2019 at 3:30pm with Malka So, one of our office's Contact information: [address] [id number] [place] [phone]           Time coordinating discharge:  43mins   Signed:   Estill Cotta M.D. [facility] 01/19/2019, 4:45 PM

## 2019-01-19 NOTE — TOC Transition Note (Signed)
Transition of Care [facility]) - CM/SW Discharge Note   Patient Details  Name: Betty Jackson MRN: [id number] Date of Birth: 1934-08-01  [facility] CM/SW Contact:  Dessa Phi, RN Phone Number: 01/19/2019, 4:01 PM   Clinical Narrative: Patient already has 3n1 even though ordered. No further CM needs.      Final next level of care: [place] Barriers to Discharge: No Barriers Identified   Patient Goals and CMS Choice Patient states their goals for this hospitalization and ongoing recovery are:: go home CMS Medicare.gov Compare Post Acute Care list provided to:: Patient Choice offered to / list presented to : Patient  Discharge Placement                       Discharge Plan and Services   Discharge Planning Services: CM Consult Post Acute Care Choice: Home Health                    HH Arranged: RN, OT [facility] Agency: [place] Date [place]: 01/18/19 Time [place]: [id number] Representative spoke with at [place]: [place] ([place]) Interventions     Readmission Risk Interventions No flowsheet data found.

## 2019-01-19 NOTE — Progress Notes (Addendum)
V.O received from provider to wean Cardizem gtt. Patient is now getting IV Cardizem 5 ml/hr. VS remain stable 73, 122/74, 95 % ra HR sustained 70's. Will continue to monitor.

## 2019-01-19 NOTE — Consult Note (Addendum)
Cardiology Consultation:   Patient ID: Betty Jackson MRN: [id number]; DOB: May 14, 1934  Admit date: 01/13/2019 Date of Consult: 01/19/2019  Primary Care Provider: Eulas Post, MD Primary Cardiologist: [name], MD  Primary Electrophysiologist:  None    Patient Profile:   Betty Jackson is a 84 y.o. female with a history of CAD s/p DES to RCA in 2005 while living in [place], [place], paroxysmal atrial fibrillation on Eliquis, PVCs/PACs, hypertension, hyperlipidemia, GERD, rheumatoid arthritis, and temporal arteritis on chronic Prednisone, who is being seen today for evaluation of atrial fibrillation with RVR at the request of Dr. Tana Coast  History of Present Illness:   [name] is a 84 year old female with the above history who is followed by Dr. Angelena Form. She had remote stenting to RCA in 2005 while living in [place], [place]. She has since moved to [facility] for treatment of her temporal arteritis at [facility] and to be near family. Most recent cardiac catheterization in 11/2012 showed patent RCA stent with mild disease of LAD and Cx. She has had a few episodes of chest pain since then with normal stress tests. Most recent nuclear stress test in 08/31/2017 showed no ischemia. She was admitted in 04/2017 with influenza A, pneumonia, and sepsis. Her beta-blocker was held and she developed atrial fibrillation with RVR. She converted back to sinus rhythm after several hours but was also noted to have frequent PVCs and PACs. Echo at that time showed LVEF of 60-65% with grade 2 diastolic dysfunction, and mild aortic insufficiency. She was not discharged on anticoagulation. She wore a monitor after discharge and was found to have recurrent atrial fibrillation. Given CHA2DS2-VASc score of 4, she was started on Eliquis.   Patient presented to the [facility] ED on 01/12/2019 with abdominal pain, nausea, vomiting, and worsening back pain. Labs showed mild hypokalemia and mild leukocytosis but otherwise no  significant acute changes. Abdominal/pelvic CT showed evidence of diverticulitis. Patient given antibiotics, pain medications, and Zofran and discharged home. Patient returned to the [facility] ED on 01/13/2019 for same symptoms and inability to keep down any of her medications, including her chronic pain medications and steroids.   In the ED, patient mildly tachycardic and tachypneic but vitals stable. Chest x-ray showed no acute findings. WBC 10.4, Hgb 11.3, Plts 194. Na 139, K 3.4, Glucose 106, BUN 12, Cr 1.26 (0.73 the day before). AST 44, ALT 51, Alk Phos 141, Total Bili 2.7. All LFTs mildly elevated compared to the day before. Lipase normal. Patient admitted for acute diverticulitis.  Overnight on 01/17/2019, patient noted to go into atrial fibrillation with ventricular rates in the 160's. Patient received 2 doses of IV Cardizem 26m, 2 dose of IV Lopressor 579m1 dose of Digoxin 0.85m43mand a 500 mL NS bolus with no significant improvement. Patient ultimately started on Cardizem drip. Echo from 01/18/2019 showed LVEF of 60-65% with indeterminate diastolic filling pattern, and mildly elevated PASP. Cardiology consulted for assistance.  Yesterday evening at shift changes, patient became acutely confused and could not tell accurate date of birth, year, or president. At the time of today's evaluation, patient's daughter states confusion has improved but she is not back to normal yet. She is oriented to name, birthday, place, date (month, day, and year), and president. She also gave a good summary of what brought her in and hospital course. However, she is still clearly somewhat confused. At what Abdominal pain, nause, and vomiting have significantly improved. She is now able to take PO.  She is unaware that she is in atrial fibrillation and denies any palpitations. She noted some mild dizziness when sitting on side of bed yesterday and when standing after being started on Cardizem but states it did not last  long. No syncope. She denies any chest pain, shortness of breath, orthopnea, or recent PND. She has mild lower extremity edema. Daughter states she really does not seem to have any cardiac problems. Daughter biggest complaint is patient's insomnia. Daughter states she sometimes will go 3 days without sleeping and daughter really thinks that her sleep deficit is what led to GI issues. No abnormal bleeding - no hematochezia, melena, or hemoptysis.  Heart Pathway Score:     Past Medical History:  Diagnosis Date   Abdominal pain, epigastric 06/22/2013   Abdominal pain, other specified site 12/24/2011   Abnormal urinalysis 05/20/2017   Acute respiratory failure with hypoxia ([place]) 05/20/2017   Anemia, unspecified 10/28/2012   Anxiety state, unspecified 10/28/2012   Bruises easily    d/t being on prednisone and plavix   CAD (coronary artery disease)    a. Stent RCA in [facility];  b. Cath approx 2009 - nonobs per pt report.   Cataract    immature on the left eye   Chronic insomnia 02/07/2013   Chronic lower back pain    scoliosis   CKD (chronic kidney disease) stage 3, GFR 97-74 ml/min    Complication of anesthesia    pt has a very high tolerance to meds   Coronary disease 05/20/2017   CVA (cerebral infarction) 10/29/2012   DDD (degenerative disc disease)    Depression    Diverticulitis    hx of   Diverticulosis    Elevated transaminase level 02/07/2013   Enteritis    GERD (gastroesophageal reflux disease) 09/01/2010   Giant cell arteritis (HCC)    Hemorrhoids    Herniated nucleus pulposus, L5-S1, right 11/04/2015   History of scabies    HTN (hypertension) 05/20/2017   Hyperlipidemia    takes Lipitor daily   Hypertension    takes Amlodipine,Losartan,Metoprolol,and HCTZ daily   Incontinence of urine    Influenza A 05/20/2017   Insomnia    takes Restoril nightly   Lumbar stenosis 04/25/2013   Major depressive disorder, recurrent episode, moderate ([place])  07/16/2013   Migraines    "back in my 20's; none since" (10/28/2012)   Osteoporosis    PAF (paroxysmal atrial fibrillation) ([place]) 2011   a. lone epidode in 2011 according to notes.   Rheumatoid arthritis ([place])    Scoliosis    Sepsis ([place]) 05/20/2017   Sinus bradycardia    a. on chronic bb   Temporal arteritis ([place]) 2011   a. followed @ [place]; potential flareup 10/28/2012/notes 10/28/2012   Vocal cord dysfunction    "they don't operate properly" (10/28/2012)    Past Surgical History:  Procedure Laterality Date   ABDOMINAL HYSTERECTOMY  ~ 1984   vaginally   BACK SURGERY  7-73yr ago   X Stop   BLADDER SUSPENSION  2001   BREAST BIOPSY Right    CATARACT EXTRACTION W/ INTRAOCULAR LENS IMPLANT Right ~ 08/2012   COLONOSCOPY  01/26/2012   Procedure: COLONOSCOPY;  Surgeon: MLadene Artist MD,FACG;  [facility];  Service: Endoscopy;  Laterality: N/A;  note the EGD is possible   CORONARY ANGIOPLASTY WITH STENT PLACEMENT  2006   X 1 stent   EPIDURAL BLOCK INJECTION     ESOPHAGOGASTRODUODENOSCOPY  01/26/2012   Procedure: ESOPHAGOGASTRODUODENOSCOPY (EGD);  Surgeon: Ladene Artist, MD,FACG;  Location: [facility] ENDOSCOPY;  Service: Endoscopy;  Laterality: N/A;   HEMIARTHROPLASTY HIP Right 2012   LAPAROSCOPIC CHOLECYSTECTOMY  2001   LUMBAR FUSION  03/2013   LUMBAR LAMINECTOMY/DECOMPRESSION MICRODISCECTOMY Right 11/04/2015   Procedure: Right Lumbar Five-Sacral One Microdiskectomy;  Surgeon: Kristeen Miss, MD;  Location: [place] [facility];  Service: Neurosurgery;  Laterality: Right;  Right L5-S1 Microdiskectomy   moles removed that required stiches     one on leg and one on face   TEMPORAL ARTERY BIOPSY / LIGATION Bilateral 2011   TONSILLECTOMY AND ADENOIDECTOMY     at age 8   TUBAL LIGATION  ~ [place]  ~ 2010   "lower back" (10/28/2012)     Home Medications:  Prior to Admission medications   Medication Sig Start Date End Date Taking? Authorizing Provider   cholecalciferol (VITAMIN D) 1000 UNITS tablet Take 1,000 Units by mouth daily.    Yes [provider]  ciprofloxacin (CIPRO) 500 MG tablet Take 1 tablet (500 mg total) by mouth every 12 (twelve) hours for 14 days. 01/12/19 01/26/19 Yes Gareth Morgan, MD  dicyclomine (BENTYL) 20 MG tablet Take 1 tablet (20 mg total) by mouth 2 (two) times daily as needed for spasms. 01/12/19  Yes Schlossman, Erin, MD  ELIQUIS 5 MG TABS tablet TAKE 1 TABLET BY MOUTH TWICE A DAY Patient taking differently: Take 5 mg by mouth 2 (two) times daily.  11/15/18  Yes Burnell Blanks, MD  escitalopram (LEXAPRO) 10 MG tablet TAKE 1 TABLET BY MOUTH EVERY DAY Patient taking differently: Take 10 mg by mouth daily.  12/14/18  Yes Burchette, Alinda Sierras, MD  furosemide (LASIX) 40 MG tablet Take 1 tablet (27m) by mouth twice a day and an extra 1/2 tablet (21m twice a day as needed for edema. 07/14/18  Yes McBurnell BlanksMD  HYDROcodone-acetaminophen (NORCO/VICODIN) 5-325 MG tablet Take one tablet by mouth every 8 hours as needed for pain. May refill in two months. Patient taking differently: Take 1 tablet by mouth every 6 (six) hours as needed for moderate pain.  08/05/18  Yes Burchette, BrAlinda SierrasMD  losartan (COZAAR) 100 MG tablet TAKE 1 TABLET BY MOUTH EVERY DAY Patient taking differently: Take 100 mg by mouth daily.  12/14/18  Yes Burchette, BrAlinda SierrasMD  metoprolol tartrate (LOPRESSOR) 50 MG tablet TAKE 1 AND 1/2 TABLET BY MOUTH TWICE A DAY. TAKE 1 EXTRA TABLET AS NEEDED FOR RAPID HEART RATE, OVER 100 BPM. Patient taking differently: Take 25-50 mg by mouth 2 (two) times daily. May take an additional tablet as needed for rapid heart rate 09/23/18  Yes LeImogene BurnPA-C  metroNIDAZOLE (FLAGYL) 500 MG tablet Take 1 tablet (500 mg total) by mouth 3 (three) times daily for 14 days. 01/12/19 01/26/19 Yes ScGareth MorganMD  ondansetron (ZOFRAN ODT) 4 MG disintegrating tablet Take 1 tablet (4 mg total) by  mouth every 8 (eight) hours as needed for nausea or vomiting. 01/12/19  Yes ScGareth MorganMD  oxyCODONE (ROXICODONE) 5 MG immediate release tablet Take 1 tablet (5 mg total) by mouth every 4 (four) hours as needed for severe pain. 01/12/19  Yes ScGareth MorganMD  pantoprazole (PROTONIX) 40 MG tablet Take 1 tablet (40 mg total) by mouth daily. 01/11/19  Yes Burchette, BrAlinda SierrasMD  potassium chloride SA (K-DUR,KLOR-CON) 20 MEQ tablet Take 1 tablet (20 mEq total) by mouth daily. 06/08/17  Yes Burchette, BrAlinda SierrasMD  predniSONE (  DELTASONE) 10 MG tablet 4 tablets for 3 days, 3 tablets for 3 days, 2 tablets for 3 days, 1 tablet continue Patient taking differently: Take 10 mg by mouth daily.  05/26/17  Yes Reyne Dumas, MD  vitamin C (ASCORBIC ACID) 500 MG tablet Take 1,000 mg by mouth daily as needed (for sore throat).    Yes [provider]  vitamin E (VITAMIN E) 400 UNIT capsule Take 400 Units by mouth daily.   Yes [provider]  lidocaine (LIDODERM) 5 % Place 1 patch onto the skin daily. Remove & Discard patch within 12 hours or as directed by MD Patient not taking: Reported on 01/13/2019 08/17/18   Nuala Alpha A, PA-C  rosuvastatin (CRESTOR) 10 MG tablet TAKE 1 TABLET BY MOUTH EVERY DAY Patient not taking: Reported on 01/13/2019 06/28/18   Imogene Burn, PA-C    Inpatient Medications: Scheduled Meds:  apixaban  5 mg Oral BID   ciprofloxacin  500 mg Oral BID   escitalopram  10 mg Oral Daily   feeding supplement  1 Container Oral TID BM   lidocaine  1 patch Transdermal Q24H   metoprolol tartrate  25 mg Oral BID   metroNIDAZOLE  500 mg Oral Q8H   multivitamin with minerals  1 tablet Oral Daily   ondansetron (ZOFRAN) IV  4 mg Intravenous Q8H   pantoprazole  40 mg Oral Daily   polyethylene glycol  17 g Oral Daily   predniSONE  10 mg Oral Q breakfast   senna-docusate  2 tablet Oral BID   traMADol  50 mg Oral Once   Continuous Infusions:  sodium  chloride 1,000 mL (01/18/19 1856)   diltiazem (CARDIZEM) infusion 15 mg/hr (01/19/19 0219)   PRN Meds: sodium chloride, acetaminophen **OR** acetaminophen, HYDROcodone-acetaminophen, morphine injection, ondansetron (ZOFRAN) IV, promethazine  Allergies:    Allergies  Allergen Reactions   Dextromethorphan Rash    Social History:   Social History   Socioeconomic History   Marital status: Married    Spouse name: Not on file   Number of children: 3   Years of education: Not on file   Highest education level: Not on file  Occupational History   Occupation: Retired Biomedical engineer strain: Not on file   Food insecurity    Worry: Not on file    Inability: Not on Lexicographer needs    Medical: Not on file    Non-medical: Not on file  Tobacco Use   Smoking status: Never Smoker   Smokeless tobacco: Never Used  Substance and Sexual Activity   Alcohol use: Yes    Comment: socially   Drug use: No   Sexual activity: Not on file  Lifestyle   Physical activity    Days per week: Not on file    Minutes per session: Not on file   Stress: Not on file  Relationships   Social connections    Talks on phone: Not on file    Gets together: Not on file    Attends religious service: Not on file    Active member of club or organization: Not on file    Attends meetings of clubs or organizations: Not on file    Relationship status: Not on file   Intimate partner violence    Fear of current or ex partner: Not on file    Emotionally abused: Not on file    Physically abused: Not on file    Forced  sexual activity: Not on file  Other Topics Concern   Not on file  Social History Narrative   Lives in [place] with her husband.  Retired Cabin crew.    Family History:    Family History  Problem Relation Age of Onset   Brain cancer Mother 20   Heart attack Father    Breast cancer Daughter 49   Heart disease Other    Hypertension  Other    Arthritis Neg Hx    Colon cancer Neg Hx    Osteoporosis Neg Hx      ROS:  Please see the history of present illness.  Review of Systems  Constitutional: Negative for fever.  HENT: Negative for congestion.   Respiratory: Positive for cough (chronic). Negative for hemoptysis, sputum production and shortness of breath.   Cardiovascular: Positive for leg swelling. Negative for chest pain, palpitations, orthopnea and PND.  Gastrointestinal: Positive for abdominal pain (improved), diarrhea, nausea (improved) and vomiting (improved). Negative for blood in stool and melena.  Genitourinary: Negative for hematuria.  Musculoskeletal: Positive for back pain (chronic low back pain).  Neurological: Positive for dizziness. Negative for loss of consciousness.  Endo/Heme/Allergies: Does not bruise/bleed easily.  Psychiatric/Behavioral: Positive for memory loss (confusion). The patient has insomnia.    Physical Exam/Data:   Vitals:   01/18/19 1957 01/18/19 2039 01/19/19 0024 01/19/19 0604  BP: (!) 116/59 (!) 144/79 (!) 141/67 134/64  Pulse: (!) 110 85 77 77  Resp:  '18 18 18  ' Temp:  98.4 F (36.9 C) 98.2 F (36.8 C) 98 F (36.7 C)  TempSrc:  Oral Oral Oral  SpO2:  96% 95% 95%  Weight:      Height:        Intake/Output Summary (Last 24 hours) at 01/19/2019 0723 Last data filed at 01/18/2019 1800 Gross per 24 hour  Intake 1664.6 ml  Output 600 ml  Net 1064.6 ml   Last 3 Weights 01/13/2019 01/12/2019 12/14/2018  Weight (lbs) 159 lb 13.3 oz 160 lb 163 lb 12.8 oz  Weight (kg) 72.5 kg 72.576 kg 74.299 kg     Body mass index is 27.01 kg/m.  General: 84 y.o. female resting comfortably in no acute distress. HEENT: Normocephalic and atraumatic. Sclera clear. EOMs intact. Neck: Supple. Soft right carotid bruits. No JVD. Heart: Mildly tachycardic with irregularly irregular rhythm. Distinct S1 and S2. No murmurs, gallops, or rubs. Radial pulses 2+ and equal bilaterally. Lungs: No  increased work of breathing. Clear to ausculation bilaterally. No wheezes, rhonchi, or rales.  Abdomen: Soft, non-distended, and non-tender to palpation. Bowel sounds present. Extremities: Mild lower extremity edema bilaterally.  Skin: Warm and dry. Neuro: Alert and oriented x3 but still somewhat confused. No focal deficits. Psych: Normal affect. Responds appropriately.   EKG:  The following EKG was personally reviewed and demonstrates:   Telemetry:  Telemetry was personally reviewed and demonstrates:  Atrial fibrillation with rates mostly in the 70's to 110's but as high as the 130's.   Relevant CV Studies: Echocardiogram 01/18/2019: Impression: 1. Left ventricular ejection fraction, by visual estimation, is 60 to 65%. The left ventricle has normal function. Normal left ventricular size. There is no left ventricular hypertrophy.  2. Definity contrast agent was given IV to delineate the left ventricular endocardial borders.  3. Left ventricular diastolic Doppler parameters are indeterminate pattern of LV diastolic filling.  4. Global right ventricle has normal systolic function.The right ventricular size is normal. No increase in right ventricular wall thickness.  5. Left atrial size  was not well visualized.  6. Right atrial size was not well visualized.  7. Presence of pericardial fat pad.  8. Mild mitral annular calcification.  9. The mitral valve is normal in structure. No evidence of mitral valve regurgitation. No evidence of mitral stenosis. 10. The tricuspid valve is normal in structure. Tricuspid valve regurgitation is mild. 11. The aortic valve is tricuspid Aortic valve regurgitation was not visualized by color flow Doppler. Mild aortic valve sclerosis without stenosis. 12. The pulmonic valve was not well visualized. Pulmonic valve regurgitation is not visualized by color flow Doppler. 13. Mildly elevated pulmonary artery systolic pressure. 14. The inferior vena cava is normal in  size with greater than 50% respiratory variability, suggesting right atrial pressure of 3 mmHg. 15. The interatrial septum appears to be lipomatous. _______________  Myoview 08/31/2017:  Nuclear stress EF: 69%.  There was no ST segment deviation noted during stress.  The study is normal.  This is a low risk study.  The left ventricular ejection fraction is hyperdynamic (>65%).   Normal pharmacologic nuclear stress test with no evidence for prior infarct or ischemia.  Hyperdynamic LVEF.  Laboratory Data:  High Sensitivity Troponin:  No results for input(s): TROPONINIHS in the last 720 hours.   Chemistry Recent Labs  Lab 01/16/19 0250 01/17/19 0403 01/18/19 0521  NA 139 139 137  K 3.5 3.8 3.6  CL 107 107 106  CO2 23 23 21*  GLUCOSE 147* 103* 101*  BUN 7* 15 22  CREATININE 0.77 0.93 0.91  CALCIUM 8.8* 8.4* 8.0*  GFRNONAA >60 57* 59*  GFRAA >60 >60 >60  ANIONGAP '9 9 10    ' Recent Labs  Lab 01/12/19 1136 01/13/19 1230  PROT 6.8 6.7  ALBUMIN 3.7 3.4*  AST 16 44*  ALT 14 51*  ALKPHOS 77 141*  BILITOT 2.0* 2.7*   Hematology Recent Labs  Lab 01/16/19 0250 01/17/19 0403 01/18/19 0521  WBC 15.5* 13.7* 10.1  RBC 3.46* 3.36* 3.40*  HGB 10.3* 9.8* 10.2*  HCT 33.0* 31.6* 31.9*  MCV 95.4 94.0 93.8  MCH 29.8 29.2 30.0  MCHC 31.2 31.0 32.0  RDW 13.2 13.3 13.4  PLT 176 195 220   BNPNo results for input(s): BNP, PROBNP in the last 168 hours.  DDimer No results for input(s): DDIMER in the last 168 hours.   Radiology/Studies:  No results found.  Assessment and Plan:   Paroxysmal Atrial Fibrillation - Patient noted to go into atrial fibrillation with RVR on 10/20 in setting of acute diverticulitis and poor PO intake. - Telemetry shows atrial fibrillation with rates mostly in the 70's to 110's but as high as the 120-130's at times.  - Echo shows LVEF of 60-65%. - Potassium 3.6 yesterday. Will recheck. - Will check Magnesium and TSH. - Continue Cardizem drip. Wean  as able. - Patient on Lopressor 76m twice daily at home. Currently on 255mtwice daily here. Will increase to 5026mwice daily. - Continue chronic anticoagulation with Eliquis 5mg65mice daily.  CAD - History of remote DES to RCA in 2005. Most recent Myoview in 08/2017 showed no ischemia. - Stable. No reports of angina. - Continue medical management for secondary prevention.  Chronic Diastolic CHF - Echo this admission showed LVEF of 60-65%. Indeterminate diastolic filling parameters. \ - Patient on Lasix 40mg47mce daily at home with extra 20 mg twice daily PRN for edema. She has not needed extra doses in long time. Home Lasix was held on admission as patient was felt  to be more on the dry side. - Net positive 8.4 L this admission. Has mild lower extremity on exam but does not appear significantly volume overloaded. - Consider restarting home Lasix.   Acute Diverticulitis - Abdominal pain, nausea, vomiting improving. Patient now able to take PO. - Management per primary team.   Hypertension - BP currently well controlled 134/64. - Continue Cardizem drip. - Continue Lopressor as above.  Hyperlipidemia - Most recent LDL 74 in 10/2018. Goal <70 given CAD. - Patient on Crestor 76m daily at home but this was held on admission. LFTs were mildly elevated on admission so will recheck before considering restarting.  Insomnia - Daughter's biggest complaint today is patient's insomnia. She states patient will sometimes go 3 days without sleeping. She has discussed this with PCP before but PCP does not want to prescribe any sleeping aids due to patient chronic narcotics due to chronic low back pain.  - Will defer to primary team.  For questions or updates, please contact CSpringerPlease consult [email or web address] for contact info under     Signed, CDarreld Mclean PA-C  01/19/2019 7:23 AM

## 2019-01-19 NOTE — TOC Transition Note (Signed)
Transition of Care [facility]) - CM/SW Discharge Note   Patient Details  Name: Betty Jackson MRN: [id number] Date of Birth: 02-10-1935  [facility] of [facility] CM/SW Contact:  Dessa Phi, RN Phone Number: 01/19/2019, 3:59 PM   Clinical Narrative: [name] HHPT rep Tommi Rumps aware of d/c. No further CM needs.      Final next level of care: [place] Barriers to Discharge: No Barriers Identified   Patient Goals and CMS Choice Patient states their goals for this hospitalization and ongoing recovery are:: go home CMS Medicare.gov Compare Post Acute Care list provided to:: Patient Choice offered to / list presented to : Patient  Discharge Placement                       Discharge Plan and Services   Discharge Planning Services: CM Consult Post Acute Care Choice: Home Health                    HH Arranged: PT [place]: [place] Date [place]: 01/18/19 Time [place]: [id number] Representative spoke with at [place]: [place] ([place]) Interventions     Readmission Risk Interventions No flowsheet data found.

## 2019-01-19 NOTE — Progress Notes (Signed)
Patient will continue to be monitored due to uncontrolled HR 120's. Discharge will be held until tomorrow.

## 2019-01-19 NOTE — Progress Notes (Signed)
Patient ambulated 30 feet.  HR increased 136 during activity and sustained 124. Pt returned to room in chair. HR sustaining 104-124. Provider updated.

## 2019-01-19 NOTE — Care Management Important Message (Signed)
Important Message  Patient Details IM Letter given to Dessa Phi RN to present to the Patient Name: Betty Jackson MRN: [id number] Date of Birth: 09-16-1934   Medicare Important Message Given:  Yes     Kerin Salen 01/19/2019, 3:26 PM

## 2019-01-20 DIAGNOSIS — E876 Hypokalemia: Secondary | ICD-10-CM

## 2019-01-20 DIAGNOSIS — G8929 Other chronic pain: Secondary | ICD-10-CM

## 2019-01-20 LAB — CULTURE, BLOOD (ROUTINE X 2)
Culture: NO GROWTH
Culture: NO GROWTH

## 2019-01-20 MED ORDER — DILTIAZEM HCL 60 MG PO TABS
60.0000 mg | ORAL_TABLET | Freq: Three times a day (TID) | ORAL | Status: DC
  Administered 2019-01-20: 60 mg via ORAL
  Filled 2019-01-20: qty 1

## 2019-01-20 MED ORDER — FUROSEMIDE 40 MG PO TABS
40.0000 mg | ORAL_TABLET | Freq: Two times a day (BID) | ORAL | Status: DC
  Administered 2019-01-20: 40 mg via ORAL
  Filled 2019-01-20: qty 1

## 2019-01-20 MED ORDER — METOPROLOL TARTRATE 25 MG PO TABS
25.0000 mg | ORAL_TABLET | Freq: Once | ORAL | Status: AC
  Administered 2019-01-20: 25 mg via ORAL
  Filled 2019-01-20: qty 1

## 2019-01-20 MED ORDER — POTASSIUM CHLORIDE CRYS ER 20 MEQ PO TBCR
40.0000 meq | EXTENDED_RELEASE_TABLET | Freq: Once | ORAL | Status: AC
  Administered 2019-01-20: 40 meq via ORAL
  Filled 2019-01-20: qty 2

## 2019-01-20 MED ORDER — PREDNISONE 5 MG PO TABS
30.0000 mg | ORAL_TABLET | Freq: Once | ORAL | Status: AC
  Administered 2019-01-20: 30 mg via ORAL
  Filled 2019-01-20: qty 1

## 2019-01-20 MED ORDER — LOSARTAN POTASSIUM 25 MG PO TABS
25.0000 mg | ORAL_TABLET | Freq: Every day | ORAL | Status: DC
  Administered 2019-01-20: 25 mg via ORAL
  Filled 2019-01-20: qty 1

## 2019-01-20 MED ORDER — LOSARTAN POTASSIUM 25 MG PO TABS: 25.0000 mg | ORAL_TABLET | Freq: Every day | ORAL | 3 refills | Status: DC

## 2019-01-20 MED ORDER — DILTIAZEM HCL 60 MG PO TABS: 60.0000 mg | ORAL_TABLET | Freq: Three times a day (TID) | ORAL | 2 refills | Status: DC

## 2019-01-20 MED ORDER — METRONIDAZOLE 500 MG PO TABS: 500.0000 mg | ORAL_TABLET | Freq: Three times a day (TID) | ORAL | 0 refills | Status: AC

## 2019-01-20 MED ORDER — CIPROFLOXACIN HCL 500 MG PO TABS: 500.0000 mg | ORAL_TABLET | Freq: Two times a day (BID) | ORAL | 0 refills | Status: AC

## 2019-01-20 MED ORDER — FUROSEMIDE 40 MG PO TABS: ORAL_TABLET | ORAL | 3 refills | Status: DC

## 2019-01-20 MED ORDER — METOPROLOL TARTRATE 50 MG PO TABS: 100.0000 mg | ORAL_TABLET | Freq: Two times a day (BID) | ORAL | Status: DC

## 2019-01-20 MED ORDER — METOPROLOL TARTRATE 100 MG PO TABS: 100.0000 mg | ORAL_TABLET | Freq: Two times a day (BID) | ORAL | 2 refills | Status: DC

## 2019-01-20 NOTE — Progress Notes (Signed)
Progress Note  Patient Name: Betty Jackson Date of Encounter: 01/20/2019  Primary Cardiologist: [name], MD   Subjective   No chest pain and no SOB lying flat in bed.  Inpatient Medications    Scheduled Meds: . apixaban  5 mg Oral BID  . ciprofloxacin  500 mg Oral BID  . diltiazem  30 mg Oral Q8H  . escitalopram  10 mg Oral Daily  . feeding supplement  1 Container Oral TID BM  . furosemide  40 mg Oral Daily  . lidocaine  1 patch Transdermal Q24H  . metoprolol tartrate  75 mg Oral BID  . multivitamin with minerals  1 tablet Oral Daily  . pantoprazole  40 mg Oral Daily  . polyethylene glycol  17 g Oral Daily  . predniSONE  10 mg Oral Q breakfast  . senna-docusate  2 tablet Oral BID   Continuous Infusions: . sodium chloride 10 mL/hr at 01/19/19 1000   PRN Meds: sodium chloride, acetaminophen **OR** acetaminophen, HYDROcodone-acetaminophen, morphine injection, ondansetron (ZOFRAN) IV, promethazine   Vital Signs    Vitals:   01/19/19 1436 01/19/19 1656 01/19/19 2049 01/20/19 0512  BP: (!) 181/100 135/75 127/82 (!) 160/67  Pulse: 81 70 97 88  Resp:   20 20  Temp:   98.3 F (36.8 C) 98.4 F (36.9 C)  TempSrc:   Oral   SpO2: 96%  96% 96%  Weight:      Height:        Intake/Output Summary (Last 24 hours) at 01/20/2019 0856 Last data filed at 01/19/2019 1700 Gross per 24 hour  Intake 519.83 ml  Output 2 ml  Net 517.83 ml   Last 3 Weights 01/13/2019 01/12/2019 12/14/2018  Weight (lbs) 159 lb 13.3 oz 160 lb 163 lb 12.8 oz  Weight (kg) 72.5 kg 72.576 kg 74.299 kg      Telemetry    A fib with RVR for periods of time this AM to 160s.  - Personally Reviewed  ECG    No new - Personally Reviewed  Physical Exam   GEN: No acute distress.   Neck: No JVD Cardiac: irreg irreg, no murmurs, rubs, or gallops.  Respiratory: Clear to auscultation bilaterally. GI: Soft, nontender, non-distended  MS: No edema; No deformity. Neuro:  Nonfocal  Psych:  Normal affect   Labs    High Sensitivity Troponin:  No results for input(s): TROPONINIHS in the last 720 hours.    Chemistry Recent Labs  Lab 01/13/19 1230  01/17/19 0403 01/18/19 0521 01/19/19 0929  NA 139   < > 139 137 138  K 3.4*   < > 3.8 3.6 3.3*  CL 100   < > 107 106 108  CO2 26   < > 23 21* 21*  GLUCOSE 106*   < > 103* 101* 123*  BUN 12   < > 15 22 14   CREATININE 1.26*   < > 0.93 0.91 0.78  CALCIUM 8.7*   < > 8.4* 8.0* 8.3*  PROT 6.7  --   --   --  5.2*  ALBUMIN 3.4*  --   --   --  2.5*  AST 44*  --   --   --  13*  ALT 51*  --   --   --  12  ALKPHOS 141*  --   --   --  60  BILITOT 2.7*  --   --   --  0.3  GFRNONAA 40*   < >  57* 59* >60  GFRAA 46*   < > >60 >60 >60  ANIONGAP 13   < > 9 10 9    < > = values in this interval not displayed.     Hematology Recent Labs  Lab 01/16/19 0250 01/17/19 0403 01/18/19 0521  WBC 15.5* 13.7* 10.1  RBC 3.46* 3.36* 3.40*  HGB 10.3* 9.8* 10.2*  HCT 33.0* 31.6* 31.9*  MCV 95.4 94.0 93.8  MCH 29.8 29.2 30.0  MCHC 31.2 31.0 32.0  RDW 13.2 13.3 13.4  PLT 176 195 220    BNPNo results for input(s): BNP, PROBNP in the last 168 hours.   DDimer No results for input(s): DDIMER in the last 168 hours.   Radiology    No results found.  Cardiac Studies   Echocardiogram 01/18/2019: Impression: 1. Left ventricular ejection fraction, by visual estimation, is 60 to 65%. The left ventricle has normal function. Normal left ventricular size. There is no left ventricular hypertrophy. 2. Definity contrast agent was given IV to delineate the left ventricular endocardial borders. 3. Left ventricular diastolic Doppler parameters are indeterminate pattern of LV diastolic filling. 4. Global right ventricle has normal systolic function.The right ventricular size is normal. No increase in right ventricular wall thickness. 5. Left atrial size was not well visualized. 6. Right atrial size was not well visualized. 7. Presence of  pericardial fat pad. 8. Mild mitral annular calcification. 9. The mitral valve is normal in structure. No evidence of mitral valve regurgitation. No evidence of mitral stenosis. 10. The tricuspid valve is normal in structure. Tricuspid valve regurgitation is mild. 11. The aortic valve is tricuspid Aortic valve regurgitation was not visualized by color flow Doppler. Mild aortic valve sclerosis without stenosis. 12. The pulmonic valve was not well visualized. Pulmonic valve regurgitation is not visualized by color flow Doppler. 13. Mildly elevated pulmonary artery systolic pressure. 14. The inferior vena cava is normal in size with greater than 50% respiratory variability, suggesting right atrial pressure of 3 mmHg. 15. The interatrial septum appears to be lipomatous. _______________  Myoview 08/31/2017:  Nuclear stress EF: 69%.  There was no ST segment deviation noted during stress.  The study is normal.  This is a low risk study.  The left ventricular ejection fraction is hyperdynamic (>65%).  Normal pharmacologic nuclear stress test with no evidence for prior infarct or ischemia. Hyperdynamic LVEF.   Patient Profile     84 y.o. female with a history of CAD s/p DES to RCA in 2005 while living in [place], [place], paroxysmal atrial fibrillation on Eliquis, PVCs/PACs, hypertension, hyperlipidemia, GERD, rheumatoid arthritis, and temporal arteritis on chronic Prednisone, now with a fib RVR.    Assessment & Plan   Paroxysmal Atrial Fibrillation - Patient noted to go into atrial fibrillation with RVR on 10/20 in setting of acute diverticulitis and poor PO intake. - Telemetry shows atrial fibrillation with rates mostly in the 70's to 110's but as high as the 160's at times now off dilt drip.  - Echo shows LVEF of 60-65%. - Potassium 3.3 today. Has been given 40 meq - Magnesium 1.8 and TSH 3.884  - Patient on Lopressor 75mg  twice daily at home.now back on this dose will defer to Dr.  Oval Linsey but could increase to 100 mg BID. - Continue chronic anticoagulation with Eliquis 5mg  twice daily.  CAD - History of remote DES to RCA in 2005. Most recent Myoview in 08/2017 showed no ischemia. - Stable. No reports of angina. - Continue medical management for  secondary prevention.  Chronic Diastolic CHF - Echo this admission showed LVEF of 60-65%. Indeterminate diastolic filling parameters. \ - Patient on Lasix 40mg  twice daily at home with extra 20 mg twice daily PRN for edema. She has not needed extra doses in long time. Home Lasix was held on admission as patient was felt to be more on the dry side. - Net positive 8.4 L this admission. Has mild lower extremity on exam but does not appear significantly volume overloaded. - restarted home Lasix. if I&O correct +[phone] but no SOB or edema  Acute Diverticulitis - Abdominal pain, nausea, vomiting improving. Patient now able to take PO. - Management per primary team.   Hypertension - BP currently had been well controlled today at 0500 160/67. - Continue Lopressor as above.  Hyperlipidemia - Most recent LDL 74 in 10/2018. Goal <70 given CAD. - Patient on Crestor 10mg  daily at home but this was held on admission. LFTs were mildly elevated on admission  Now normal.  Would resume crestor   Insomnia - Daughter's biggest complaint today is patient's insomnia. She states patient will sometimes go 3 days without sleeping. She has discussed this with PCP before but PCP does not want to prescribe any sleeping aids due to patient chronic narcotics due to chronic low back pain.  - Will defer to primary team.       For questions or updates, please contact [place] Please consult [email or web address] for contact info under        Signed, Cecilie Kicks, NP  01/20/2019, 8:56 AM

## 2019-01-20 NOTE — Progress Notes (Signed)
Physical Therapy Treatment Patient Details Name: Betty Jackson MRN: [id number] DOB: 12-Jan-1935 Today's Date: 01/20/2019    History of Present Illness 84 yo female admitted with diverticulitis. Hx of RA, A fib, CAD, LBP, scoliosis, LE edema, DDD, CVA, CKD.    PT Comments    Patient with improved mobility today and ale to complete bed mob without assistance this session. She continues to require cues for safe use of RW and intermittent assist to steady during gait. PT's HR more stable this session; resting at 88 bpm and increased to  94-101 during sit<>stand transfer. Hr remained in 110's during gait and pt denied dizziness symptoms throughout. She will continue to benefit from skilled PT interventions to address impairments in home setting when medically ready for discharge if pt's daughter can provide 24 hour assist/supervision. Acute PT will follow and continue to progress as able.   Follow Up Recommendations  Home health PT;Supervision/Assistance - 24 hour(pt and family prefer home. daughter stated she can stay with pt)     Equipment Recommendations  3in1 (PT)    Recommendations for Other Services       Precautions / Restrictions Precautions Precautions: Fall Restrictions Weight Bearing Restrictions: No    Mobility  Bed Mobility Overal bed mobility: Needs Assistance Bed Mobility: Supine to Sit     Supine to sit: Supervision     General bed mobility comments: no assist required this date, pt able to sequence supine to sit with use of bed rails, no cues required  Transfers Overall transfer level: Needs assistance Equipment used: Rolling walker (2 wheeled) Transfers: Sit to/from Stand Sit to Stand: Min guard         General transfer comment: pt conitnues to require cues for safe hand placement and technique, no assist required to initiate power up, guarding for safety  Ambulation/Gait Ambulation/Gait assistance: Min assist Gait Distance (Feet): 60 Feet Assistive  device: Rolling walker (2 wheeled) Gait Pattern/deviations: Step-through pattern;Decreased stride length Gait velocity: decreased   General Gait Details: cues required for safe use of RW, pt with greater difficulty moving walker compared to rollator which she reports she has at home. verbal cuse required intermittently for safe proximity to RW. Pt's HR more stable this session and remained in mid to high 110's. At end of gait pt trasnfered to sit in recliener and had a small elevation to 136 bpm with PVC's, however it decreased to 110 bpm within a few seconds. No other PVC's noted.   Stairs             Wheelchair Mobility    Modified Rankin (Stroke Patients Only)       Balance Overall balance assessment: Needs assistance Sitting-balance support: Feet supported;Feet unsupported Sitting balance-[name]: Good     Standing balance support: Bilateral upper extremity supported;During functional activity Standing balance-[name]: Fair           Cognition Arousal/Alertness: Awake/alert Behavior During Therapy: WFL for tasks assessed/performed Overall Cognitive Status: Within Functional Limits for tasks assessed           Exercises      General Comments        Pertinent Vitals/Pain Pain Assessment: No/denies pain           PT Goals (current goals can now be found in the care plan section) Acute Rehab PT Goals Patient Stated Goal: home PT Goal Formulation: With patient/family Time For Goal Achievement: 01/30/19 Potential to Achieve Goals: Good Progress towards PT goals: Progressing toward goals  Frequency    Min 3X/week      PT Plan Current plan remains appropriate       AM-PAC PT "6 Clicks" Mobility   Outcome Measure  Help needed turning from your back to your side while in a flat bed without using bedrails?: A Little Help needed moving from lying on your back to sitting on the side of a flat bed without using bedrails?: A Little Help  needed moving to and from a bed to a chair (including a wheelchair)?: A Little Help needed standing up from a chair using your arms (e.g., wheelchair or bedside chair)?: A Little Help needed to walk in hospital room?: A Little Help needed climbing 3-5 steps with a railing? : A Lot 6 Click Score: 17    End of Session Equipment Utilized During Treatment: Gait belt Activity Tolerance: Patient tolerated treatment well Patient left: in bed;with call bell/phone within reach;with bed alarm set Nurse Communication: Mobility status PT Visit Diagnosis: Unsteadiness on feet (R26.81);Muscle weakness (generalized) (M62.81);Pain;Difficulty in walking, not elsewhere classified (R26.2)     Time: [id number] PT Time Calculation (min) (ACUTE ONLY): 16 min  Charges:  $Gait Training: 8-22 mins                     Kipp Brood, PT, DPT Physical Therapist with [facility]  01/20/2019 1:23 PM

## 2019-01-20 NOTE — TOC Progression Note (Signed)
Transition of Care [facility]) - Progression Note    Patient Details  Name: Betty Jackson MRN: [id number] Date of Birth: 05/10/1934  [facility] CM/SW Contact  Rivers Gassmann, [name], RN Phone Number: 01/20/2019, 3:49 PM  Clinical Narrative: Patient ordered for 3n1-per dtr [name] she will get it on own-informed her it is not covered by insurance. Lenn Cal aware of d/c today HHPT/OT-he will contact dtr [facility] about [facility] welcome call for home [phone]      Expected Discharge Plan: [place] Barriers to Discharge: No Barriers Identified  Expected Discharge Plan and Services Expected Discharge Plan: [place]   Discharge Planning Services: CM Consult Post Acute Care Choice: [place] arrangements for the past 2 months: Single Family Home Expected Discharge Date: 01/20/19                         HH Arranged: RN, OT [facility] Agency: [place] Date [facility] Agency Contacted: 01/18/19 Time [place]: 1529 Representative spoke with at [place]: [place] ([place]) Interventions    Readmission Risk Interventions No flowsheet data found.

## 2019-01-20 NOTE — Discharge Summary (Signed)
Physician Discharge Summary   Patient ID: Betty Jackson MRN: [id number] DOB/AGE: [birth year] 84 y.o.  Admit date: 01/13/2019 Discharge date: 01/20/2019  Primary Care Physician:  Eulas Post, MD   Recommendations for Outpatient Follow-up:  1. Follow up with PCP in 1-2 weeks 2. Cardizem added 60 mg PO q 8hours, until follow-up  3. Losartan decreased to 25mg  daily  4. Increased Metoprolol to 100 mg PO BID.   Home Health: Home Health PT, OT  Equipment/Devices: DME 3n1  Discharge Condition: stable  CODE STATUS: DNR   Diet recommendation: heart healthy    Discharge Diagnoses:    . Acute diverticulitis . Temporal arteritis ([place]) . Atrial fibrillation with RVR  ([place]) . Lumbar stenosis . AKI (acute kidney injury) ([place]) . Chronic pain . Tachycardia . Hypokalemia . Nausea   Consults: Cardiology    Allergies:   Allergies  Allergen Reactions  . Dextromethorphan Rash     DISCHARGE MEDICATIONS: Allergies as of 01/20/2019      Reactions   Dextromethorphan Rash      Medication List    TAKE these medications   cholecalciferol 1000 units tablet Commonly known as: VITAMIN D Take 1,000 Units by mouth daily.   ciprofloxacin 500 MG tablet Commonly known as: CIPRO Take 1 tablet (500 mg total) by mouth every 12 (twelve) hours for 6 days.   dicyclomine 20 MG tablet Commonly known as: BENTYL Take 1 tablet (20 mg total) by mouth 2 (two) times daily as needed for spasms.   diltiazem 60 MG tablet Commonly known as: CARDIZEM Take 1 tablet (60 mg total) by mouth every 8 (eight) hours.   Eliquis 5 MG Tabs tablet Generic drug: apixaban TAKE 1 TABLET BY MOUTH TWICE A DAY What changed: how much to take   escitalopram 10 MG tablet Commonly known as: LEXAPRO TAKE 1 TABLET BY MOUTH EVERY DAY   furosemide 40 MG tablet Commonly known as: LASIX Take 1 tablet (40mg ) by mouth twice a day and an extra 1/2 tablet (20mg ) twice a day as needed for edema.    HYDROcodone-acetaminophen 5-325 MG tablet Commonly known as: NORCO/VICODIN Take one tablet by mouth every 8 hours as needed for pain. May refill in two months. What changed:   how much to take  how to take this  when to take this  reasons to take this  additional instructions   lidocaine 5 % Commonly known as: Lidoderm Place 1 patch onto the skin daily. Remove & Discard patch within 12 hours or as directed by MD   losartan 25 MG tablet Commonly known as: COZAAR Take 1 tablet (25 mg total) by mouth daily. Start taking on: January 21, 2019 What changed:   medication strength  how much to take   metoprolol tartrate 100 MG tablet Commonly known as: LOPRESSOR Take 1 tablet (100 mg total) by mouth 2 (two) times daily. What changed:   medication strength  See the new instructions.   metroNIDAZOLE 500 MG tablet Commonly known as: FLAGYL Take 1 tablet (500 mg total) by mouth 3 (three) times daily for 6 days.   ondansetron 4 MG disintegrating tablet Commonly known as: Zofran ODT Take 1 tablet (4 mg total) by mouth every 8 (eight) hours as needed for nausea or vomiting.   pantoprazole 40 MG tablet Commonly known as: PROTONIX Take 1 tablet (40 mg total) by mouth daily.   potassium chloride SA 20 MEQ tablet Commonly known as: KLOR-CON Take 1 tablet (20 mEq total) by mouth daily.  predniSONE 10 MG tablet Commonly known as: DELTASONE 4 tablets for 3 days, 3 tablets for 3 days, 2 tablets for 3 days, 1 tablet continue What changed:   how much to take  how to take this  when to take this  additional instructions   vitamin C 500 MG tablet Commonly known as: ASCORBIC ACID Take 1,000 mg by mouth daily as needed (for sore throat).   vitamin E 400 UNIT capsule Generic drug: vitamin E Take 400 Units by mouth daily.            Durable Medical Equipment  (From admission, onward)         Start     Ordered   01/19/19 1547  For home use only DME 3 n 1  Once      01/19/19 1546           Brief H and P: For complete details please refer to admission H and P, but in brief Patient is a 84 year old female with history of CAD status post PTCA, chronic low back pain, CKD stage III, GERD, CVA, history of paroxysmal A. fib on Eliquis, history of rheumatoid arthritis/temporal arteritis on chronic prednisone, anxiety/depression and prior history of diverticulitis presented on 10/16 with nausea, emesis and diarrhea.  CT of the abdomen and pelvis showed acute diverticulitis, was [facility] at [facility] ED on 10/15.  Patient returned to our ED with persistent abdominal pain diminished oral intake and inability to tolerate oral antibiotics. In ED T-max 102.2 F, heart rate 103, BP 104/56.  COVID-19 negative Patient was placed on IV antibiotics, pain control and admitted for further work-up. [facility] Course:  Acute diverticulitis : Uncomplicated without abscess or perforation, SIRS -Nausea and vomiting improved, now tolerating diet and oral antibiotics. - cont ciprofloxacin and Flagyl x 6 days to complete full course (patient was started outpatient)   Atrial fibrillation with RVR -On 10/21 overnight, patient was noted to be in RVR, atrial fibrillation confirmed on EKG, heart rate in 140s to 150s.  Received digoxin, Lopressor, Cardizem bolus x2 with no significant improvement, hence was placed on cardizem drip.  -Continue Eliquis and beta-blocker, increase dose to 100 mg twice a day -Cardiology was consulted. - cardiology drip weaned off. HR on ambulation still bit high, Dr Oval Linsey recommended adding short acting Cardizem 60 mg every 8 hours.  If rates are better controlled, will consolidate Cardizem dose p.o. outpatient.  AKI on CKD stage III -Prerenal likely due to diminished oral intake, persistent nausea, emesis -Patient presented with creatinine of 1.5, now improved with IV fluid hydration -Creatinine 0.8 at the time of discharge. - lasix was resumed at  home dose, 40 mg twice daily  Essential hypertension Resumed Lasix, added Cardizem, losartan 25 mg daily, metoprolol was increased  History of rheumatoid arthritis, temporal arteritis Patient had a mild right vision issues, typical of her temporal arteritis flare, was given 40 mg prednisone today.  Patient usually takes quick taper of prednisone when this happens and then continues maintenance dose.  She has prednisone at home.  History of anxiety, depression Continue Lexapro  Chronic low back pain -Continue pain control as home regimen  Chronic normocytic anemia H&H stable  GERD -Speech evaluated with no concerns for dysphagia.  Continue PPI  Generalized debility -Appears to be very weak and debilitated, PT recommended HHPT  Dementia with sundowning: - daughter requested discharge home today as patient has sundowning in the hospital and does well in familiar surroundings.   Day  of Discharge  S: Rate better controlled today, feels better, hoping to go home today.  Tolerating diet  BP (!) 134/96 (BP Location: Left Arm)   Pulse 86   Temp 98 F (36.7 C) (Oral)   Resp 18   Ht 5' 4.5" (1.638 m)   Wt 72.5 kg   SpO2 95%   BMI 27.01 kg/m   Physical Exam:   General: Alert and oriented x 3, NAD  Eyes  HEENT:  Atraumatic, normocephalic  Cardiovascular: Irregularly irregular  Respiratory: CTAB, no wheezing, rales or rhonchi  Gastrointestinal: Soft, nontender, nondistended, NBS  Ext: 1+ pitting edema bilaterally   Neuro: no new deficits  Musculoskeletal: No cyanosis, clubbing  Skin: No rashes  Psych: Normal affect and demeanor, alert and oriented x3      The results of significant diagnostics from this hospitalization (including imaging, microbiology, ancillary and laboratory) are listed below for reference.      Procedures/Studies:  Ct Abdomen Pelvis W Contrast  Result Date: 01/12/2019 CLINICAL DATA:  Mid low back pain and low abd pain x3days.  EXAM: CT ABDOMEN AND PELVIS WITH CONTRAST TECHNIQUE: Multidetector CT imaging of the abdomen and pelvis was performed using the standard protocol following bolus administration of intravenous contrast. CONTRAST:  125mL OMNIPAQUE IOHEXOL 300 MG/ML  SOLN COMPARISON:  05/20/2017 FINDINGS: Lower chest: There is scarring within the RIGHT middle lobe and lung bases. Heart size is normal. Hepatobiliary: Scattered calcified nodules are identified within the liver, consistent with remote granulomatous disease. Focal fatty infiltration identified adjacent to the falciform ligament. Status post cholecystectomy. There is prominence of the common bile duct, measuring 11 millimeters, likely secondary to cholecystectomy. Pancreas: Unremarkable. No pancreatic ductal dilatation or surrounding inflammatory changes. Spleen: Normal in size without focal abnormality. Adrenals/Urinary Tract: Adrenal glands are normal in appearance. Subcentimeter low-attenuation lesions within both kidneys are likely cysts. No hydronephrosis. Courses of the ureters are partially obscured by metallic artifact in the pelvis. The bladder and visualized portion of the urethra are normal. Stomach/Bowel: Small hiatal hernia normal. Stomach is normal in appearance. Small bowel loops are normal caliber. The appendix is well seen and has a normal appearance. There are numerous colonic diverticula. There is circumferential thickening of the proximal sigmoid segment, associated with sigmoid mesenteric edema. Numerous diverticular are identified in this region and findings are consistent with acute diverticulitis. No evidence for perforation. No abscess. Vascular/Lymphatic: There is dense atherosclerotic calcification of the abdominal aorta. Although involved by atherosclerosis, there is vascular opacification of the celiac axis, superior mesenteric artery, and inferior mesenteric artery. Normal appearance of the portal venous system and inferior vena cava. No  retroperitoneal or mesenteric adenopathy. Reproductive: Prior hysterectomy. No adnexal mass. Other: Small fat containing paraumbilical hernia. No ascites. Musculoskeletal: RIGHT hip arthroplasty and associated significant artifact in the pelvis. Status post posterior fusion with interbody fusion devices at L2-L4. Prior vertebral augmentation at T11 and L1. Significant degenerative changes throughout the lumbar spine. IMPRESSION: 1. Findings are consistent with acute sigmoid diverticulitis. No evidence for perforation or abscess. 2. Status post cholecystectomy and hysterectomy. 3. Small hiatal hernia. 4. Small fat containing paraumbilical hernia. 5. Postoperative and degenerative changes in the lumbar spine. 6. Aortic Atherosclerosis (ICD10-I70.0). Electronically Signed   By: Nolon Nations M.D.   On: 01/12/2019 14:39   Dg Chest Port 1 View  Result Date: 01/13/2019 CLINICAL DATA:  Dyspnea. EXAM: PORTABLE CHEST 1 VIEW COMPARISON:  11/25/2016 FINDINGS: Normal heart size. Lungs are hypoinflated. No pleural effusion identified. Linear scar like densities versus  parenchymal banding identified within the left midlung and left base. No airspace opacities identified. IMPRESSION: 1. No acute cardiopulmonary abnormalities. 2. Linear scar like densities in the left midlung and left base. Electronically Signed   By: Kerby Moors M.D.   On: 01/13/2019 17:10      LAB RESULTS: Basic Metabolic Panel: Recent Labs  Lab 01/18/19 0521 01/19/19 0929  NA 137 138  K 3.6 3.3*  CL 106 108  CO2 21* 21*  GLUCOSE 101* 123*  BUN 22 14  CREATININE 0.91 0.78  CALCIUM 8.0* 8.3*  MG  --  1.8   Liver Function Tests: Recent Labs  Lab 01/19/19 0929  AST 13*  ALT 12  ALKPHOS 60  BILITOT 0.3  PROT 5.2*  ALBUMIN 2.5*   No results for input(s): LIPASE, AMYLASE in the last 168 hours. No results for input(s): AMMONIA in the last 168 hours. CBC: Recent Labs  Lab 01/17/19 0403 01/18/19 0521  WBC 13.7* 10.1  HGB  9.8* 10.2*  HCT 31.6* 31.9*  MCV 94.0 93.8  PLT 195 220   Cardiac Enzymes: No results for input(s): CKTOTAL, CKMB, CKMBINDEX, TROPONINI in the last 168 hours. BNP: Invalid input(s): POCBNP CBG: No results for input(s): GLUCAP in the last 168 hours.    Disposition and Follow-up: Discharge Instructions    Diet - low sodium heart healthy   Complete by: As directed    Discharge instructions   Complete by: As directed    Please HOLD cardizem dose if HR less than 50 or BP low in 90's. Follow up with your cardiologist.   Increase activity slowly   Complete by: As directed        DISPOSITION: *home    [place], [facility] Follow up.   Specialty: Home Health Services Why: [place] physical therapy/occupational therapy/nursing Contact information: [address] [place] [address] [phone]        Eulas Post, MD. Schedule an appointment as soon as possible for a visit in 2 week(s).   Specialty: Family Medicine Contact information: [place] [address] [phone]        [place] Follow up.   Specialty: Cardiology Why: You have a follow-up in our [facility] on Monday 01/23/2019 at 3:30pm with Malka So, one of our office's physician assistant's. Parking Deck code is [id number].   Contact information: [address] [id number] [place] [phone]           Time coordinating discharge:  35 minutes  Signed:   Estill Cotta M.D. [facility] 01/20/2019, 3:00 PM

## 2019-01-23 ENCOUNTER — Other Ambulatory Visit: Payer: Self-pay

## 2019-01-23 ENCOUNTER — Telehealth: Payer: Self-pay | Admitting: Cardiovascular Disease

## 2019-01-23 ENCOUNTER — Ambulatory Visit (HOSPITAL_COMMUNITY)
Admit: 2019-01-23 | Discharge: 2019-01-23 | Disposition: A | Discharge status: Home / Self Care | Payer: Medicare Other | Source: Ambulatory Visit | Attending: Physician Assistant | Admitting: Physician Assistant

## 2019-01-23 VITALS — BP 144/76 | HR 70 | Ht 64.5 in | Wt 164.0 lb

## 2019-01-23 DIAGNOSIS — I2510 Atherosclerotic heart disease of native coronary artery without angina pectoris: Secondary | ICD-10-CM | POA: Insufficient documentation

## 2019-01-23 DIAGNOSIS — F329 Major depressive disorder, single episode, unspecified: Secondary | ICD-10-CM | POA: Diagnosis not present

## 2019-01-23 DIAGNOSIS — I480 Paroxysmal atrial fibrillation: Secondary | ICD-10-CM | POA: Insufficient documentation

## 2019-01-23 DIAGNOSIS — Z8673 Personal history of transient ischemic attack (TIA), and cerebral infarction without residual deficits: Secondary | ICD-10-CM | POA: Insufficient documentation

## 2019-01-23 DIAGNOSIS — E785 Hyperlipidemia, unspecified: Secondary | ICD-10-CM | POA: Diagnosis not present

## 2019-01-23 DIAGNOSIS — F419 Anxiety disorder, unspecified: Secondary | ICD-10-CM | POA: Insufficient documentation

## 2019-01-23 DIAGNOSIS — Z79899 Other long term (current) drug therapy: Secondary | ICD-10-CM | POA: Insufficient documentation

## 2019-01-23 DIAGNOSIS — Z7901 Long term (current) use of anticoagulants: Secondary | ICD-10-CM | POA: Diagnosis not present

## 2019-01-23 DIAGNOSIS — I130 Hypertensive heart and chronic kidney disease with heart failure and stage 1 through stage 4 chronic kidney disease, or unspecified chronic kidney disease: Secondary | ICD-10-CM | POA: Diagnosis not present

## 2019-01-23 DIAGNOSIS — M419 Scoliosis, unspecified: Secondary | ICD-10-CM | POA: Insufficient documentation

## 2019-01-23 DIAGNOSIS — K219 Gastro-esophageal reflux disease without esophagitis: Secondary | ICD-10-CM | POA: Insufficient documentation

## 2019-01-23 DIAGNOSIS — J9601 Acute respiratory failure with hypoxia: Secondary | ICD-10-CM | POA: Diagnosis not present

## 2019-01-23 DIAGNOSIS — D649 Anemia, unspecified: Secondary | ICD-10-CM | POA: Diagnosis not present

## 2019-01-23 DIAGNOSIS — I5032 Chronic diastolic (congestive) heart failure: Secondary | ICD-10-CM | POA: Insufficient documentation

## 2019-01-23 DIAGNOSIS — N183 Chronic kidney disease, stage 3 unspecified: Secondary | ICD-10-CM | POA: Insufficient documentation

## 2019-01-23 LAB — BASIC METABOLIC PANEL
Anion gap: 15 (ref 5–15)
BUN: 16 mg/dL (ref 8–23)
CO2: 26 mmol/L (ref 22–32)
Calcium: 8.5 mg/dL — ABNORMAL LOW (ref 8.9–10.3)
Chloride: 97 mmol/L — ABNORMAL LOW (ref 98–111)
Creatinine, Ser: 1.09 mg/dL — ABNORMAL HIGH (ref 0.44–1.00)
GFR calc Af Amer: 55 mL/min — ABNORMAL LOW (ref >60)
GFR calc non Af Amer: 47 mL/min — ABNORMAL LOW (ref >60)
Glucose, Bld: 118 mg/dL — ABNORMAL HIGH (ref 70–99)
Potassium: 3.6 mmol/L (ref 3.5–5.1)
Sodium: 138 mmol/L (ref 135–145)

## 2019-01-23 MED ORDER — DILTIAZEM HCL ER COATED BEADS 120 MG PO CP24: 120.0000 mg | ORAL_CAPSULE | Freq: Every day | ORAL | 3 refills | Status: DC

## 2019-01-23 NOTE — Progress Notes (Signed)
Primary Care Physician: Eulas Post, MD Primary Cardiologist: Dr Angelena Form Primary Electrophysiologist: none Referring Physician: Dr Verlon Au is a 84 y.o. female with a history of CAD s/p DES to RCA in 2005 while living in [place], [place], paroxysmal atrial fibrillation on Eliquis, PVCs/PACs, hypertension, hyperlipidemia, GERD,rheumatoid arthritis, andtemporal arteritis on chronic prednisone who presents for consultation in the [facility].  The patient was initially diagnosed with atrial fibrillation in 2019 in the setting of sepsis and pneumonia which resolved within several hours. She has been maintained on Eliquis 5 mg BID for CHADS2VASC score of 7. She was recently admitted for diverticulitis and developed afib with RVR. Patient reports that since leaving the hospital, she did have heart racing for the next two days but she feels much better today. She is in SR today. She does report that she has more lower extremity edema although she denies SOB, PND, or orthopnea.  Today, she denies symptoms of palpitations, chest pain, shortness of breath, orthopnea, PND, dizziness, presyncope, syncope, snoring, daytime somnolence, bleeding, or neurologic sequela. The patient is tolerating medications without difficulties and is otherwise without complaint today.    Atrial Fibrillation Risk Factors:  she does not have symptoms or diagnosis of sleep apnea. she does not have a history of rheumatic fever.   she has a BMI of Body mass index is 27.72 kg/m.Marland Kitchen Filed Weights   01/23/19 1553  Weight: 74.4 kg    Family History  Problem Relation Age of Onset  . Brain cancer Mother 70  . Heart attack Father   . Breast cancer Daughter 30  . Heart disease Other   . Hypertension Other   . Arthritis Neg Hx   . Colon cancer Neg Hx   . Osteoporosis Neg Hx      Atrial Fibrillation Management history:  Previous antiarrhythmic drugs: none  Previous cardioversions: none Previous ablations: none CHADS2VASC score: 7 Anticoagulation history: Eliquis   Past Medical History:  Diagnosis Date  . Abdominal pain, epigastric 06/22/2013  . Abdominal pain, other specified site 12/24/2011  . Abnormal urinalysis 05/20/2017  . Acute respiratory failure with hypoxia ([place]) 05/20/2017  . Anemia, unspecified 10/28/2012  . Anxiety state, unspecified 10/28/2012  . Bruises easily    d/t being on prednisone and plavix  . CAD (coronary artery disease)    a. Stent RCA in [facility];  b. Cath approx 2009 - nonobs per pt report.  . Cataract    immature on the left eye  . Chronic insomnia 02/07/2013  . Chronic lower back pain    scoliosis  . CKD (chronic kidney disease) stage 3, GFR 30-59 ml/min   . Complication of anesthesia    pt has a very high tolerance to meds  . Coronary disease 05/20/2017  . CVA (cerebral infarction) 10/29/2012  . DDD (degenerative disc disease)   . Depression   . Diverticulitis    hx of  . Diverticulosis   . Elevated transaminase level 02/07/2013  . Enteritis   . GERD (gastroesophageal reflux disease) 09/01/2010  . Giant cell arteritis ([place])   . Hemorrhoids   . Herniated nucleus pulposus, L5-S1, right 11/04/2015  . History of scabies   . HTN (hypertension) 05/20/2017  . Hyperlipidemia    takes Lipitor daily  . Hypertension    takes Amlodipine,Losartan,Metoprolol,and HCTZ daily  . Incontinence of urine   . Influenza A 05/20/2017  . Insomnia    takes Restoril nightly  .  Lumbar stenosis 04/25/2013  . Major depressive disorder, recurrent episode, moderate ([place]) 07/16/2013  . Migraines    "back in my 20's; none since" (10/28/2012)  . Osteoporosis   . PAF (paroxysmal atrial fibrillation) ([place]) 2011   a. lone epidode in 2011 according to notes.  . Rheumatoid arthritis ([place])   . Scoliosis   . Sepsis ([place]) 05/20/2017  . Sinus bradycardia    a. on chronic bb  . Temporal arteritis ([place]) 2011   a. followed @ [facility];  potential flareup 10/28/2012/notes 10/28/2012  . Vocal cord dysfunction    "they don't operate properly" (10/28/2012)   Past Surgical History:  Procedure Laterality Date  . ABDOMINAL HYSTERECTOMY  ~ 1984   vaginally  . BACK SURGERY  7-43yrs ago   X Stop  . BLADDER SUSPENSION  2001  . BREAST BIOPSY Right   . CATARACT EXTRACTION W/ INTRAOCULAR LENS IMPLANT Right ~ 08/2012  . COLONOSCOPY  01/26/2012   Procedure: COLONOSCOPY;  Surgeon: Ladene Artist, MD,FACG;  Location: [facility] ENDOSCOPY;  Service: Endoscopy;  Laterality: N/A;  note the EGD is possible  . CORONARY ANGIOPLASTY WITH STENT PLACEMENT  2006   X 1 stent  . EPIDURAL BLOCK INJECTION    . ESOPHAGOGASTRODUODENOSCOPY  01/26/2012   Procedure: ESOPHAGOGASTRODUODENOSCOPY (EGD);  Surgeon: Ladene Artist, MD,FACG;  Location: [facility] ENDOSCOPY;  Service: Endoscopy;  Laterality: N/A;  . HEMIARTHROPLASTY HIP Right 2012  . LAPAROSCOPIC CHOLECYSTECTOMY  2001  . LUMBAR FUSION  03/2013  . LUMBAR LAMINECTOMY/DECOMPRESSION MICRODISCECTOMY Right 11/04/2015   Procedure: Right Lumbar Five-Sacral One Microdiskectomy;  Surgeon: Kristeen Miss, MD;  Location: [facility] NEURO ORS;  Service: Neurosurgery;  Laterality: Right;  Right L5-S1 Microdiskectomy  . moles removed that required stiches     one on leg and one on face  . TEMPORAL ARTERY BIOPSY / LIGATION Bilateral 2011  . TONSILLECTOMY AND ADENOIDECTOMY     at age 62  . TUBAL LIGATION  ~ 1982  . X-STOP IMPLANTATION  ~ 2010   "lower back" (10/28/2012)    Current Outpatient Medications  Medication Sig Dispense Refill  . amoxicillin (AMOXIL) 500 MG capsule TAKE 4 CAPSULES BY MOUTH 1 HOUR BEFORE DENTAL PROCEDURE    . cholecalciferol (VITAMIN D) 1000 UNITS tablet Take 1,000 Units by mouth daily.     . ciprofloxacin (CIPRO) 500 MG tablet Take 1 tablet (500 mg total) by mouth every 12 (twelve) hours for 6 days. 12 tablet 0  . dicyclomine (BENTYL) 20 MG tablet Take 1 tablet (20 mg total) by mouth 2 (two) times daily as needed  for spasms. 20 tablet 0  . ELIQUIS 5 MG TABS tablet TAKE 1 TABLET BY MOUTH TWICE A DAY (Patient taking differently: Take 5 mg by mouth 2 (two) times daily. ) 60 tablet 5  . escitalopram (LEXAPRO) 10 MG tablet TAKE 1 TABLET BY MOUTH EVERY DAY (Patient taking differently: Take 10 mg by mouth daily. ) 90 tablet 3  . furosemide (LASIX) 40 MG tablet Take 1 tablet (40mg ) by mouth twice a day and an extra 1/2 tablet (20mg ) twice a day as needed for edema. 135 tablet 3  . HYDROcodone-acetaminophen (NORCO/VICODIN) 5-325 MG tablet Take one tablet by mouth every 8 hours as needed for pain. May refill in two months. (Patient taking differently: Take 1 tablet by mouth every 6 (six) hours as needed for moderate pain. ) 90 tablet 0  . losartan (COZAAR) 25 MG tablet Take 1 tablet (25 mg total) by mouth daily. 30 tablet 3  .  metoprolol tartrate (LOPRESSOR) 100 MG tablet Take 1 tablet (100 mg total) by mouth 2 (two) times daily. 60 tablet 2  . metroNIDAZOLE (FLAGYL) 500 MG tablet Take 1 tablet (500 mg total) by mouth 3 (three) times daily for 6 days. 18 tablet 0  . ondansetron (ZOFRAN ODT) 4 MG disintegrating tablet Take 1 tablet (4 mg total) by mouth every 8 (eight) hours as needed for nausea or vomiting. 20 tablet 0  . pantoprazole (PROTONIX) 40 MG tablet Take 1 tablet (40 mg total) by mouth daily. 30 tablet 3  . potassium chloride SA (KLOR-CON) 20 MEQ tablet Take 1 tablet (20 mEq total) by mouth daily. 90 tablet 1  . predniSONE (DELTASONE) 10 MG tablet 4 tablets for 3 days, 3 tablets for 3 days, 2 tablets for 3 days, 1 tablet continue (Patient taking differently: Take 10 mg by mouth daily. ) 90 tablet 1  . vitamin C (ASCORBIC ACID) 500 MG tablet Take 1,000 mg by mouth daily as needed (for sore throat).     . vitamin E (VITAMIN E) 400 UNIT capsule Take 400 Units by mouth daily.    Marland Kitchen diltiazem (CARDIZEM CD) 120 MG 24 hr capsule Take 1 capsule (120 mg total) by mouth daily. 30 capsule 3   No current  facility-administered medications for this encounter.     Allergies  Allergen Reactions  . Dextromethorphan Rash    Social History   Socioeconomic History  . Marital status: Married    Spouse name: Not on file  . Number of children: 3  . Years of education: Not on file  . Highest education level: Not on file  Occupational History  . Occupation: Retired Development worker, community  . Financial resource strain: Not on file  . Food insecurity    Worry: Not on file    Inability: Not on file  . Transportation needs    Medical: Not on file    Non-medical: Not on file  Tobacco Use  . Smoking status: Never Smoker  . Smokeless tobacco: Never Used  Substance and Sexual Activity  . Alcohol use: Yes    Comment: socially  . Drug use: No  . Sexual activity: Not on file  Lifestyle  . Physical activity    Days per week: Not on file    Minutes per session: Not on file  . Stress: Not on file  Relationships  . Social Herbalist on phone: Not on file    Gets together: Not on file    Attends religious service: Not on file    Active member of club or organization: Not on file    Attends meetings of clubs or organizations: Not on file    Relationship status: Not on file  . Intimate partner violence    Fear of current or ex partner: Not on file    Emotionally abused: Not on file    Physically abused: Not on file    Forced sexual activity: Not on file  Other Topics Concern  . Not on file  Social History Narrative   Lives in [place] with her husband.  Retired Cabin crew.     ROS- All systems are reviewed and negative except as per the HPI above.  Physical Exam: Vitals:   01/23/19 1553  BP: (!) 144/76  Pulse: 70  Weight: 74.4 kg  Height: 5' 4.5" (1.638 m)    GEN- The patient is well appearing elderly female, alert and oriented x 3 today.  Head- normocephalic, atraumatic Eyes-  Sclera clear, conjunctiva pink Ears- hearing intact Oropharynx- clear Neck- supple   Lungs- Clear to ausculation bilaterally, normal work of breathing Heart- Regular rate and rhythm, no murmurs, rubs or gallops  GI- soft, NT, ND, + BS Extremities- no clubbing, cyanosis. Trace bilateral edema MS- no significant deformity or atrophy Skin- no rash or lesion Psych- euthymic mood, full affect Neuro- strength and sensation are intact  Wt Readings from Last 3 Encounters:  01/23/19 74.4 kg  01/13/19 72.5 kg  01/12/19 72.6 kg    EKG today demonstrates SR HR 70, PR 192, QRS 84, QTc 468  Echo 01/18/19 demonstrated   1. Left ventricular ejection fraction, by visual estimation, is 60 to 65%. The left ventricle has normal function. Normal left ventricular size. There is no left ventricular hypertrophy.  2. Definity contrast agent was given IV to delineate the left ventricular endocardial borders.  3. Left ventricular diastolic Doppler parameters are indeterminate pattern of LV diastolic filling.  4. Global right ventricle has normal systolic function.The right ventricular size is normal. No increase in right ventricular wall thickness.  5. Left atrial size was not well visualized.  6. Right atrial size was not well visualized.  7. Presence of pericardial fat pad.  8. Mild mitral annular calcification.  9. The mitral valve is normal in structure. No evidence of mitral valve regurgitation. No evidence of mitral stenosis. 10. The tricuspid valve is normal in structure. Tricuspid valve regurgitation is mild. 11. The aortic valve is tricuspid Aortic valve regurgitation was not visualized by color flow Doppler. Mild aortic valve sclerosis without stenosis. 12. The pulmonic valve was not well visualized. Pulmonic valve regurgitation is not visualized by color flow Doppler. 13. Mildly elevated pulmonary artery systolic pressure. 14. The inferior vena cava is normal in size with greater than 50% respiratory variability, suggesting right atrial pressure of 3 mmHg. 15. The interatrial septum  appears to be lipomatous.  Epic records are reviewed at length today  Assessment and Plan:  1. Paroxsymal atrial fibrillation Patient had episode of afib with RVR in the setting of other medical stressors.  Continue Eliquis 5 mg BID for CHADS2VASC score of 7. Continue Lopressor 100 mg BID Will change diltiazem to 120 mg daily. We discussed that if her afib should become more persistent, we could consider AAD therapy.  This patients CHA2DS2-VASc Score and unadjusted Ischemic Stroke Rate (% per year) is equal to 11.2 % stroke rate/year from a score of 7  Above score calculated as 1 point each if present [CHF, HTN, DM, Vascular=MI/PAD/Aortic Plaque, Age if 65-74, or Female] Above score calculated as 2 points each if present [Age > 75, or Stroke/TIA/TE]   2. CAD S/p PCI in 2005. Myoview 08/2017 showed no ischemia. No anginal symptoms. Continue present therapy and risk factor modification.   3. HTN Stable, no changes today.  4. Chronic diastolic CHF Weight stable although she has had some lower extremity edema.  Will decrease CCB as above. Continue Lasix 40 mg daily. Patient may take an extra 20 mg PRN. Check Bmet today.   Follow up with Dr Angelena Form in 3 months. AF clinic as needed.   [facility] [address] [place], [place] [address] [phone] 01/23/2019 4:38 PM

## 2019-01-23 NOTE — Patient Instructions (Signed)
Stop cardizem 60mg    Start Cardizem 120mg  once a day

## 2019-01-23 NOTE — Telephone Encounter (Signed)
Spoke with patient who was calling because she was discharged Friday evening from hospital. Last night her HR was racing and her pulse ox was reading between 168 and 40.   Today it is stable in mid 70s. She denies any SOB, dizziness. Wanted to know what Dr. Angelena Form thought she should do.  Worried it might happen again tonight.  Pt has appointment today at 3:30 at [facility].  This was scheduled before she left hospital.  I provided directions/parking info and reassured her to keep this appointment today.

## 2019-01-23 NOTE — Telephone Encounter (Signed)
New Message  Patient states that she has been in the hospital and would like to speak with Dr. Angelena Form or his nurse to discuss hospital visit. Please give patient a call back to discuss.

## 2019-01-24 ENCOUNTER — Telehealth: Payer: Self-pay | Admitting: Family Medicine

## 2019-01-24 DIAGNOSIS — I2510 Atherosclerotic heart disease of native coronary artery without angina pectoris: Secondary | ICD-10-CM | POA: Diagnosis not present

## 2019-01-24 DIAGNOSIS — K5732 Diverticulitis of large intestine without perforation or abscess without bleeding: Secondary | ICD-10-CM | POA: Diagnosis not present

## 2019-01-24 DIAGNOSIS — M170 Bilateral primary osteoarthritis of knee: Secondary | ICD-10-CM | POA: Diagnosis not present

## 2019-01-24 DIAGNOSIS — I130 Hypertensive heart and chronic kidney disease with heart failure and stage 1 through stage 4 chronic kidney disease, or unspecified chronic kidney disease: Secondary | ICD-10-CM | POA: Diagnosis not present

## 2019-01-24 DIAGNOSIS — K5730 Diverticulosis of large intestine without perforation or abscess without bleeding: Secondary | ICD-10-CM | POA: Diagnosis not present

## 2019-01-24 NOTE — Telephone Encounter (Signed)
[facility] calling [facility]) secure line CB [phone] Verbal request for Phy Therapy 2 times a week for 3 wks 1 time a week for 2 wks  Home Health Aid 1 Week 1 time and 3 Weeks for 2 times

## 2019-01-25 DIAGNOSIS — M170 Bilateral primary osteoarthritis of knee: Secondary | ICD-10-CM | POA: Diagnosis not present

## 2019-01-25 DIAGNOSIS — K5732 Diverticulitis of large intestine without perforation or abscess without bleeding: Secondary | ICD-10-CM | POA: Diagnosis not present

## 2019-01-25 DIAGNOSIS — I130 Hypertensive heart and chronic kidney disease with heart failure and stage 1 through stage 4 chronic kidney disease, or unspecified chronic kidney disease: Secondary | ICD-10-CM | POA: Diagnosis not present

## 2019-01-25 DIAGNOSIS — I2510 Atherosclerotic heart disease of native coronary artery without angina pectoris: Secondary | ICD-10-CM | POA: Diagnosis not present

## 2019-01-25 DIAGNOSIS — K5730 Diverticulosis of large intestine without perforation or abscess without bleeding: Secondary | ICD-10-CM | POA: Diagnosis not present

## 2019-01-25 NOTE — Telephone Encounter (Signed)
Okay to approve requested services

## 2019-01-26 NOTE — Telephone Encounter (Signed)
Called and gave verbal orders per Dr. Elease Hashimoto.

## 2019-01-27 ENCOUNTER — Ambulatory Visit: Payer: Self-pay | Admitting: *Deleted

## 2019-01-27 ENCOUNTER — Telehealth: Payer: Self-pay | Admitting: Cardiovascular Disease

## 2019-01-27 NOTE — Telephone Encounter (Signed)
  Pt c/o medication issue:  1. Name of Medication: rosuvastatin  2. How are you currently taking this medication (dosage and times per day)? As directed  3. Are you having a reaction (difficulty breathing--STAT)?  NA  4. What is your medication issue? Patient was sent home from the hospital with rosuvastatin and the daughter is calling to see if she is supposed to be taking this medication or not. Please advise.

## 2019-01-27 NOTE — Telephone Encounter (Signed)
Patient is scheduled for a virtual visit with Dr. Regis Bill on 01/30/2019

## 2019-01-27 NOTE — Telephone Encounter (Signed)
Patient was released from hospital last Friday-after lengthy stay. Patient had follow up with cariology this week and daughter is calling for follow up appointment to hospital stay. She does report that patient is on a lot of medication for treatment that could cause her not to feel good. Patient is weak and can only ambulate a few feet with her walker before she is exhausted. Daughter states she has no other symptoms- she is eating and drinking well, normal bowel habits and is not complaining of any other symptoms at this time. (Dizziness only lasted one day and that was upon return home) Call to office to see if patient can have appointment next week because PCP is out of office- they will review and call daughter back. Melissa- contact [phone].  Reason for Disposition . [1] MODERATE weakness (i.e., interferes with work, school, normal activities) AND [2] persists > 3 days    Patient is post hospitalization call to office- needs hospital follow up appointment.  Answer Assessment - Initial Assessment Questions 1. DESCRIPTION: "Describe how you are feeling."     Weakness- patient is able to get out of bed and use walker for short distance- very fatigued- PT is coming to work with her 2. SEVERITY: "How bad is it?"  "Can you stand and walk?"   - MILD - Feels weak or tired, but does not interfere with work, school or normal activities   - [place] to stand and walk; weakness interferes with work, school, or normal activities   - SEVERE - Unable to stand or walk     moderate 3. ONSET:  "When did the weakness begin?"     At hospital- patient has been weakness since hospiatl 4. CAUSE: "What do you think is causing the weakness?"     Severe illness- causing weakness 5. MEDICINES: "Have you recently started a new medicine or had a change in the amount of a medicine?"     Antibiotics, prednisone, adjusted dosing of cardiac medication 6. OTHER SYMPTOMS: "Do you have any other symptoms?"  (e.g., chest pain, fever, cough, SOB, vomiting, diarrhea, bleeding, other areas of pain)     no 7. PREGNANCY: "Is there any chance you are pregnant?" "When was your last menstrual period?"     n/a  Protocols used: WEAKNESS (GENERALIZED) AND FATIGUE-A-AH

## 2019-01-30 ENCOUNTER — Ambulatory Visit (INDEPENDENT_AMBULATORY_CARE_PROVIDER_SITE_OTHER): Payer: Medicare Other | Admitting: Internal Medicine

## 2019-01-30 ENCOUNTER — Encounter: Payer: Self-pay | Admitting: Internal Medicine

## 2019-01-30 ENCOUNTER — Other Ambulatory Visit: Payer: Self-pay

## 2019-01-30 DIAGNOSIS — K5730 Diverticulosis of large intestine without perforation or abscess without bleeding: Secondary | ICD-10-CM | POA: Diagnosis not present

## 2019-01-30 DIAGNOSIS — Z09 Encounter for follow-up examination after completed treatment for conditions other than malignant neoplasm: Secondary | ICD-10-CM

## 2019-01-30 DIAGNOSIS — I4891 Unspecified atrial fibrillation: Secondary | ICD-10-CM

## 2019-01-30 DIAGNOSIS — M545 Low back pain, unspecified: Secondary | ICD-10-CM

## 2019-01-30 DIAGNOSIS — K5792 Diverticulitis of intestine, part unspecified, without perforation or abscess without bleeding: Secondary | ICD-10-CM | POA: Diagnosis not present

## 2019-01-30 DIAGNOSIS — R5383 Other fatigue: Secondary | ICD-10-CM | POA: Diagnosis not present

## 2019-01-30 DIAGNOSIS — Z79899 Other long term (current) drug therapy: Secondary | ICD-10-CM

## 2019-01-30 DIAGNOSIS — M170 Bilateral primary osteoarthritis of knee: Secondary | ICD-10-CM | POA: Diagnosis not present

## 2019-01-30 DIAGNOSIS — I2510 Atherosclerotic heart disease of native coronary artery without angina pectoris: Secondary | ICD-10-CM | POA: Diagnosis not present

## 2019-01-30 DIAGNOSIS — Z7952 Long term (current) use of systemic steroids: Secondary | ICD-10-CM

## 2019-01-30 DIAGNOSIS — K5732 Diverticulitis of large intestine without perforation or abscess without bleeding: Secondary | ICD-10-CM | POA: Diagnosis not present

## 2019-01-30 DIAGNOSIS — I130 Hypertensive heart and chronic kidney disease with heart failure and stage 1 through stage 4 chronic kidney disease, or unspecified chronic kidney disease: Secondary | ICD-10-CM | POA: Diagnosis not present

## 2019-01-30 NOTE — Progress Notes (Signed)
Virtual Visit via Video Note  I connected with@ on 01/30/19 at 10:30 AM EST by a video enabled telemedicine application and verified that I am speaking with the correct person using two identifiers. Location patient: home Location provider:work  office Persons participating in the virtual visit: patient, provider and Daughter Meridee Score national recommendations  regarding COVID 19 pandemic   video visit is advised over in office visit for this patient.  Patient aware  of the limitations of evaluation and management by telemedicine and  availability of in person appointments. and agreed to proceed.   HPI: Betty Jackson presents for video visit  SDA  For HOSP FU  Because her PCP NA     84 year old female with history of CAD status post PTCA, chronic low back pain, CKD stage III, GERD, CVA, history of paroxysmal A. fib on Eliquis, history of rheumatoid arthritis/temporal arteritis on chronic prednisone, anxiety/depression and prior history of diverticulitis presented on 10/16 with nausea, emesis and diarrhea. CT of the abdomen and pelvis showed acute diverticulitis,   Rx with cipro and flagyl  Ha AF with RVR  rx with dig lotpresser and cardizem  And rx with cardizema s op  Has contact with cards Dr 1.5 an at dc was  0.8 Rx with pred nof TA flare   She just finished her antibiotic treatment and has been feeling pretty tired in not up and around although use will use her walker has bedside commode.  She has no active bleeding or falling she had been on diltiazem 60 mg 3 times daily and switched to 24-hour 120 a day.  However over the weekend her pulse was up in the 150 range and daughter switched her back to 60 3 times daily which seemed to help. At one point she felt heavy in the head but no syncope.  Her appetite has improved and feels back to normal. She is back down to 10 mg on the prednisone after a flare treatment for possible TIA.  Her back pain is bothering her more now and she has  taken some hydrocodone.  She is possibly better today than yesterday. Physical therapy to begin coming out.   Admit date: 01/13/2019 Discharge date: 10/23/202   Recommendations for Outpatient Follow-up:  1. Follow up with PCP in 1-2 weeks 2. Cardizem added 60 mg PO q 8hours, until follow-up  3. Losartan decreased to 25mg  daily  4. Increased Metoprolol to 100 mg PO BID.   Home Health: Home Health PT, OT  Equipment/Devices: DME 3n1  Discharge Condition: stable  CODE STATUS: DNR   Diet recommendation: heart healthy    Discharge Diagnoses:    . Acute diverticulitis . Temporal arteritis ([place]) . Atrial fibrillation with RVR  ([place]) . Lumbar stenosis . AKI (acute kidney injury) ([place]) . Chronic pain . Tachycardia . Hypokalemia . Nausea   Consults: Cardiology ROS: See pertinent positives and negatives per HPI.  No current chest pain shortness of breath bleeding just fatigue.  Past Medical History:  Diagnosis Date  . Abdominal pain, epigastric 06/22/2013  . Abdominal pain, other specified site 12/24/2011  . Abnormal urinalysis 05/20/2017  . Acute respiratory failure with hypoxia ([place]) 05/20/2017  . Anemia, unspecified 10/28/2012  . Anxiety state, unspecified 10/28/2012  . Bruises easily    d/t being on prednisone and plavix  . CAD (coronary artery disease)    a. [facility];  b. Cath approx 2009 - nonobs per pt report.  . Cataract  immature on the left eye  . Chronic insomnia 02/07/2013  . Chronic lower back pain    scoliosis  . CKD (chronic kidney disease) stage 3, GFR 30-59 ml/min   . Complication of anesthesia    pt has a very high tolerance to meds  . Coronary disease 05/20/2017  . CVA (cerebral infarction) 10/29/2012  . DDD (degenerative disc disease)   . Depression   . Diverticulitis    hx of  . Diverticulosis   . Elevated transaminase level 02/07/2013  . Enteritis   . GERD (gastroesophageal reflux disease) 09/01/2010  . 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 Hemorrhoids   . Herniated nucleus pulposus, L5-S1, right 11/04/2015  . History of scabies   . HTN (hypertension) 05/20/2017  . Hyperlipidemia    takes Lipitor daily  . Hypertension    takes Amlodipine,Losartan,Metoprolol,and HCTZ daily  . Incontinence of urine   . Influenza A 05/20/2017  . Insomnia    takes Restoril nightly  . Lumbar stenosis 04/25/2013  . Major depressive disorder, recurrent episode, moderate ([place]) 07/16/2013  . Migraines    "back in my 20's; none since" (10/28/2012)  . Osteoporosis   . PAF (paroxysmal atrial fibrillation) ([place]) 2011   a. lone epidode in 2011 according to notes.  . Rheumatoid arthritis ([place])   . Scoliosis   . Sepsis ([place]) 05/20/2017  . Sinus bradycardia    a. on chronic bb  . Temporal arteritis ([place]) 2011   a. followed @ [facility]; potential flareup 10/28/2012/notes 10/28/2012  . Vocal cord dysfunction    "they don't operate properly" (10/28/2012)    Past Surgical History:  Procedure Laterality Date  . ABDOMINAL HYSTERECTOMY  ~ 1984   vaginally  . BACK SURGERY  7-43yrs ago   X Stop  . BLADDER SUSPENSION  2001  . BREAST BIOPSY Right   . CATARACT EXTRACTION W/ INTRAOCULAR LENS IMPLANT Right ~ 08/2012  . COLONOSCOPY  01/26/2012   Procedure: COLONOSCOPY;  Surgeon: Ladene Artist, MD,FACG;  [facility] END[facility]COPY;  Service: Endoscopy;  Laterality: N/A;  note the EGD is possible  . CORONARY ANGIOPLASTY WITH STENT PLACEMENT  2006   X 1 stent  . EPIDURAL BLOCK INJECTION    . ESOPHAGOGASTRODUODENOSCOPY  01/26/2012   Procedure: ESOPHAGOGASTRODUODENOSCOPY (EGD);  Surgeon: Ladene Artist, MD,FACG;  Location: [facility] ENDOSCOPY;  Service: Endoscopy;  Laterality: N/A;  . HEMIARTHROPLASTY HIP Right 2012  . LAPAROSCOPIC CHOLECYSTECTOMY  2001  . LUMBAR FUSION  03/2013  . LUMBAR LAMINECTOMY/DECOMPRESSION MICRODISCECTOMY Right 11/04/2015   Procedure: Right Lumbar Five-Sacral One Microdiskectomy;  Surgeon: Kristeen Miss, MD;  Location: [place] NEURO ORS;  Service:  Neurosurgery;  Laterality: Right;  Right L5-S1 Microdiskectomy  . moles removed that required stiches     one on leg and one on face  . TEMPORAL ARTERY BIOPSY / LIGATION Bilateral 2011  . TONSILLECTOMY AND ADENOIDECTOMY     at age 40  . TUBAL LIGATION  ~ 1982  . X-STOP IMPLANTATION  ~ 2010   "lower back" (10/28/2012)    Family History  Problem Relation Age of Onset  . Brain cancer Mother 48  . Heart attack Father   . Breast cancer Daughter 80  . Heart disease Other   . Hypertension Other   . Arthritis Neg Hx   . Colon cancer Neg Hx   . Osteoporosis Neg Hx     Social History   Tobacco Use  . Smoking status: Never Smoker  . Smokeless tobacco: Never Used  Substance  Use Topics  . Alcohol use: Yes    Comment: socially  . Drug use: No      Current Outpatient Medications:  .  amoxicillin (AMOXIL) 500 MG capsule, TAKE 4 CAPSULES BY MOUTH 1 HOUR BEFORE DENTAL PROCEDURE, Disp: , Rfl:  .  cholecalciferol (VITAMIN D) 1000 UNITS tablet, Take 1,000 Units by mouth daily. , Disp: , Rfl:  .  dicyclomine (BENTYL) 20 MG tablet, Take 1 tablet (20 mg total) by mouth 2 (two) times daily as needed for spasms., Disp: 20 tablet, Rfl: 0 .  diltiazem (CARDIZEM CD) 120 MG 24 hr capsule, Take 1 capsule (120 mg total) by mouth daily., Disp: 30 capsule, Rfl: 3 .  ELIQUIS 5 MG TABS tablet, TAKE 1 TABLET BY MOUTH TWICE A DAY (Patient taking differently: Take 5 mg by mouth 2 (two) times daily. ), Disp: 60 tablet, Rfl: 5 .  escitalopram (LEXAPRO) 10 MG tablet, TAKE 1 TABLET BY MOUTH EVERY DAY (Patient taking differently: Take 10 mg by mouth daily. ), Disp: 90 tablet, Rfl: 3 .  furosemide (LASIX) 40 MG tablet, Take 1 tablet (40mg ) by mouth twice a day and an extra 1/2 tablet (20mg ) twice a day as needed for edema., Disp: 135 tablet, Rfl: 3 .  HYDROcodone-acetaminophen (NORCO/VICODIN) 5-325 MG tablet, Take one tablet by mouth every 8 hours as needed for pain. May refill in two months. (Patient taking  differently: Take 1 tablet by mouth every 6 (six) hours as needed for moderate pain. ), Disp: 90 tablet, Rfl: 0 .  losartan (COZAAR) 25 MG tablet, Take 1 tablet (25 mg total) by mouth daily., Disp: 30 tablet, Rfl: 3 .  metoprolol tartrate (LOPRESSOR) 100 MG tablet, Take 1 tablet (100 mg total) by mouth 2 (two) times daily., Disp: 60 tablet, Rfl: 2 .  ondansetron (ZOFRAN ODT) 4 MG disintegrating tablet, Take 1 tablet (4 mg total) by mouth every 8 (eight) hours as needed for nausea or vomiting., Disp: 20 tablet, Rfl: 0 .  pantoprazole (PROTONIX) 40 MG tablet, Take 1 tablet (40 mg total) by mouth daily., Disp: 30 tablet, Rfl: 3 .  potassium chloride SA (KLOR-CON) 20 MEQ tablet, Take 1 tablet (20 mEq total) by mouth daily., Disp: 90 tablet, Rfl: 1 .  predniSONE (DELTASONE) 10 MG tablet, 4 tablets for 3 days, 3 tablets for 3 days, 2 tablets for 3 days, 1 tablet continue (Patient taking differently: Take 10 mg by mouth daily. ), Disp: 90 tablet, Rfl: 1 .  vitamin C (ASCORBIC ACID) 500 MG tablet, Take 1,000 mg by mouth daily as needed (for sore throat). , Disp: , Rfl:  .  vitamin E (VITAMIN E) 400 UNIT capsule, Take 400 Units by mouth daily., Disp: , Rfl:   EXAM: BP Readings from Last 3 Encounters:  01/23/19 (!) 144/76  01/20/19 (!) 134/96  01/12/19 (!) 151/64    VITALS per patient if applicable: Initial blood pressure during visit 129/100 pulse 92 at rest pulse ox 95 pulse 50 6 repeat blood pressure when more comfortable laying down 122/69 pulse 68.  Does not appear dyspneic is alert will speak [name] gives [place] more information to.  Color looks good.  GENERAL: alert, oriented, appears well and in no acute distress HEENT: atraumatic, conjunttiva clear, no obvious abnormalities on inspection of external nose and ears NECK: normal movements of the head and neck LUNGS: on inspection no signs of respiratory distress, breathing rate appears normal, no obvious gross SOB, gasping or wheezing CV: no obvious  cyanosis  PSYCH/NEURO: pleasant and cooperative, , speech and thought processing grossly intact but daughter does most of the history otherwise normal interaction Lab Results  Component Value Date   WBC 10.1 01/18/2019   HGB 10.2 (L) 01/18/2019   HCT 31.9 (L) 01/18/2019   PLT 220 01/18/2019   GLUCOSE 118 (H) 01/23/2019   CHOL 171 11/14/2018   TRIG 168.0 (H) 11/14/2018   HDL 63.40 11/14/2018   LDLCALC 74 11/14/2018   ALT 12 01/19/2019   AST 13 (L) 01/19/2019   NA 138 01/23/2019   K 3.6 01/23/2019   CL 97 (L) 01/23/2019   CREATININE 1.09 (H) 01/23/2019   BUN 16 01/23/2019   CO2 26 01/23/2019   TSH 3.884 01/19/2019   INR 1.07 10/28/2012   HGBA1C 5.8 03/31/2017    ASSESSMENT AND PLAN:  Discussed the following assessment and plan:    ICD-10-CM   1. Hospital discharge follow-up  Z09   2. Atrial fibrillation with rapid ventricular response (HCC)  I48.91   3. Acute diverticulitis  K57.92    apparenlty resolved   4. Fatigue, unspecified type  R53.83   5. Midline low back pain without sciatica, unspecified chronicity  M54.5   6. On prednisone therapy  Z79.52   7. Medication management  Z79.899    Recovering from acute diverticulitis antibiotic use possible TIA flare and episodes of atrial fib with RVR.  She apparently is now bradycardic and switch back over to the diltiazem  60 mg   dose 3 times daily this weekend.( total 180 per day) Suspect medications might have been causing some extra fatigue we will proceed with her physical therapy will let cardiology know about the medication change. Suggest they check her blood pressure and pulse twice a day for a few days and as needed to ensure avoiding bradycardia. Plan video visit in about 2 weeks with Dr. Elease Hashimoto her PCP.  Counseled.   Expectant management and discussion of plan and treatment with opportunity to ask questions and all were answered. The patient agreed with the plan and demonstrated an understanding of the  instructions.   Advised to call back or seek an in-person evaluation if worsening  or having  further concerns .  Shanon Ace, MD

## 2019-01-31 ENCOUNTER — Telehealth: Payer: Self-pay | Admitting: Cardiovascular Disease

## 2019-01-31 MED ORDER — DILTIAZEM HCL 60 MG PO TABS: 60.0000 mg | ORAL_TABLET | Freq: Three times a day (TID) | ORAL | 6 refills | Status: DC

## 2019-01-31 NOTE — Telephone Encounter (Signed)
See telephone encounter dated 11/3.  Discussed diltiazem, not rosuvastatin

## 2019-01-31 NOTE — Telephone Encounter (Signed)
° ° °  Daughter calling to discuss medications patient is taking, changes made after hospital stay

## 2019-01-31 NOTE — Telephone Encounter (Addendum)
Spoke with patient's daughter and then patient was added to call. Pt went to afib clinic 10/26, was in NSR and diltiazem 60 TID was changed to Cardizem CD 120 at that visit. Then over this past weekend she was feeling more sympotms of aifb - irregular heartbeat, a lot of fatigue.  Daughter spoke with a friend/cardiologist who recommended pt go back to the dilt 60 mg TID and call Dr. Angelena Form. Pt feels better on this dose as far as afib symptoms.  She is coughing and continues to be very weak otherwise.  Dr. Angelena Form is ok with patient continuing diltiazem 60 mg TID since she is feeling better on this dose.   Scheduled 3 month follow up for Feb.  Daughter or pt will call back if needs anything sooner.

## 2019-01-31 NOTE — Addendum Note (Signed)
Addended by: Rodman Key on: 01/31/2019 02:02 PM   Modules accepted: Orders

## 2019-02-01 DIAGNOSIS — I2510 Atherosclerotic heart disease of native coronary artery without angina pectoris: Secondary | ICD-10-CM | POA: Diagnosis not present

## 2019-02-01 DIAGNOSIS — K5732 Diverticulitis of large intestine without perforation or abscess without bleeding: Secondary | ICD-10-CM | POA: Diagnosis not present

## 2019-02-01 DIAGNOSIS — I130 Hypertensive heart and chronic kidney disease with heart failure and stage 1 through stage 4 chronic kidney disease, or unspecified chronic kidney disease: Secondary | ICD-10-CM | POA: Diagnosis not present

## 2019-02-01 DIAGNOSIS — M170 Bilateral primary osteoarthritis of knee: Secondary | ICD-10-CM | POA: Diagnosis not present

## 2019-02-01 DIAGNOSIS — K5730 Diverticulosis of large intestine without perforation or abscess without bleeding: Secondary | ICD-10-CM | POA: Diagnosis not present

## 2019-02-02 DIAGNOSIS — M47816 Spondylosis without myelopathy or radiculopathy, lumbar region: Secondary | ICD-10-CM | POA: Diagnosis not present

## 2019-02-02 DIAGNOSIS — Z981 Arthrodesis status: Secondary | ICD-10-CM | POA: Diagnosis not present

## 2019-02-02 DIAGNOSIS — M545 Low back pain: Secondary | ICD-10-CM | POA: Diagnosis not present

## 2019-02-02 DIAGNOSIS — I10 Essential (primary) hypertension: Secondary | ICD-10-CM | POA: Diagnosis not present

## 2019-02-03 DIAGNOSIS — I130 Hypertensive heart and chronic kidney disease with heart failure and stage 1 through stage 4 chronic kidney disease, or unspecified chronic kidney disease: Secondary | ICD-10-CM | POA: Diagnosis not present

## 2019-02-03 DIAGNOSIS — I2510 Atherosclerotic heart disease of native coronary artery without angina pectoris: Secondary | ICD-10-CM | POA: Diagnosis not present

## 2019-02-03 DIAGNOSIS — M170 Bilateral primary osteoarthritis of knee: Secondary | ICD-10-CM | POA: Diagnosis not present

## 2019-02-03 DIAGNOSIS — K5730 Diverticulosis of large intestine without perforation or abscess without bleeding: Secondary | ICD-10-CM | POA: Diagnosis not present

## 2019-02-03 DIAGNOSIS — K5732 Diverticulitis of large intestine without perforation or abscess without bleeding: Secondary | ICD-10-CM | POA: Diagnosis not present

## 2019-02-06 ENCOUNTER — Telehealth: Payer: Self-pay | Admitting: Family Medicine

## 2019-02-06 ENCOUNTER — Other Ambulatory Visit: Payer: Self-pay

## 2019-02-06 ENCOUNTER — Encounter: Payer: Self-pay | Admitting: Family Medicine

## 2019-02-06 DIAGNOSIS — R42 Dizziness and giddiness: Secondary | ICD-10-CM

## 2019-02-06 NOTE — Telephone Encounter (Signed)
error:[id number] ° °

## 2019-02-06 NOTE — Telephone Encounter (Signed)
Daughter sent mychart message and provider responded. Will close this out

## 2019-02-06 NOTE — Telephone Encounter (Addendum)
°  Melissa Vogelsinger (Daughter) Jadden Prioleau (Daughter) Showing 2 of 2   [phone]      Patient was last seen by 12/14/2018 dizziness has not improved, daughter unsure if patient should be referred to ENT or if appointment is needed, daughter open to virtual appointment today or tomorrow, please advise daughter with a follow up call.

## 2019-02-07 ENCOUNTER — Telehealth: Payer: Self-pay | Admitting: Family Medicine

## 2019-02-07 ENCOUNTER — Telehealth: Payer: Self-pay

## 2019-02-07 DIAGNOSIS — M170 Bilateral primary osteoarthritis of knee: Secondary | ICD-10-CM | POA: Diagnosis not present

## 2019-02-07 DIAGNOSIS — I130 Hypertensive heart and chronic kidney disease with heart failure and stage 1 through stage 4 chronic kidney disease, or unspecified chronic kidney disease: Secondary | ICD-10-CM | POA: Diagnosis not present

## 2019-02-07 DIAGNOSIS — K5732 Diverticulitis of large intestine without perforation or abscess without bleeding: Secondary | ICD-10-CM | POA: Diagnosis not present

## 2019-02-07 DIAGNOSIS — I2510 Atherosclerotic heart disease of native coronary artery without angina pectoris: Secondary | ICD-10-CM | POA: Diagnosis not present

## 2019-02-07 DIAGNOSIS — K5730 Diverticulosis of large intestine without perforation or abscess without bleeding: Secondary | ICD-10-CM | POA: Diagnosis not present

## 2019-02-07 NOTE — Telephone Encounter (Signed)
She should be seen if having any elbow or hip pain.  However, I do not think I have any openings .   Would need to be during hours that X-ray is available, if possible.

## 2019-02-07 NOTE — Telephone Encounter (Signed)
Betty Jackson- patient's physical therapist- wanted to report that the patient fell yesterday and now is having hip pain.  He said the patient stated that the daughter was going to call back because she thought she already had an appointment for tomorrow.

## 2019-02-07 NOTE — Telephone Encounter (Signed)
Copied from [place] [id number]. Topic: General - Inquiry >> Feb 07, 2019  3:57 PM Virl Axe D wrote: Reason for CRM: Pt was standing at her walker and dropped a piece of mail and bent down to pick up and fell back on her hip and elbow. She didn't feel like she hurt herself but PT suggested pt follow up with PCP. Pt's daughter called for in office appt tomorrow.

## 2019-02-07 NOTE — Telephone Encounter (Signed)
See separate note.

## 2019-02-08 DIAGNOSIS — K5732 Diverticulitis of large intestine without perforation or abscess without bleeding: Secondary | ICD-10-CM | POA: Diagnosis not present

## 2019-02-08 DIAGNOSIS — I2510 Atherosclerotic heart disease of native coronary artery without angina pectoris: Secondary | ICD-10-CM | POA: Diagnosis not present

## 2019-02-08 DIAGNOSIS — K5730 Diverticulosis of large intestine without perforation or abscess without bleeding: Secondary | ICD-10-CM | POA: Diagnosis not present

## 2019-02-08 DIAGNOSIS — I130 Hypertensive heart and chronic kidney disease with heart failure and stage 1 through stage 4 chronic kidney disease, or unspecified chronic kidney disease: Secondary | ICD-10-CM | POA: Diagnosis not present

## 2019-02-08 DIAGNOSIS — M170 Bilateral primary osteoarthritis of knee: Secondary | ICD-10-CM | POA: Diagnosis not present

## 2019-02-08 NOTE — Telephone Encounter (Signed)
Spoke with pt's daughter, [name] (DPR), and she states pt is not complaining of significant pain however she already does not get up and move around a lot due to the vertigo. She reports it was not a hard fall, more of a slight fall from a squatting position. Pt does not seem to be favoring her elbow or hip, daughter did not see any bruising or other obvious signs of injury. She believes the PT/OT team are requesting pt to be evaluated before continuing treatments.  Dr. Elease Hashimoto - you have no 26min openings this week. Please advise. Thank you!   Melissa [phone]

## 2019-02-09 DIAGNOSIS — K5730 Diverticulosis of large intestine without perforation or abscess without bleeding: Secondary | ICD-10-CM | POA: Diagnosis not present

## 2019-02-09 DIAGNOSIS — I130 Hypertensive heart and chronic kidney disease with heart failure and stage 1 through stage 4 chronic kidney disease, or unspecified chronic kidney disease: Secondary | ICD-10-CM | POA: Diagnosis not present

## 2019-02-09 DIAGNOSIS — K5732 Diverticulitis of large intestine without perforation or abscess without bleeding: Secondary | ICD-10-CM | POA: Diagnosis not present

## 2019-02-09 DIAGNOSIS — M170 Bilateral primary osteoarthritis of knee: Secondary | ICD-10-CM | POA: Diagnosis not present

## 2019-02-09 DIAGNOSIS — I2510 Atherosclerotic heart disease of native coronary artery without angina pectoris: Secondary | ICD-10-CM | POA: Diagnosis not present

## 2019-02-09 NOTE — Telephone Encounter (Signed)
Either need to offer follow up with another provider or set up for next week.  Does not sound emergent by history.

## 2019-02-09 NOTE — Telephone Encounter (Signed)
Spoke with Melissa, pt's daughter/DPR, and gave recommendations. Pt has appt Monday morning for virtual visit. She will assess pt over the weekend and determine if they need to switch to in-office and if so she will call Monday morning. Nothing further needed at this time.

## 2019-02-10 DIAGNOSIS — I130 Hypertensive heart and chronic kidney disease with heart failure and stage 1 through stage 4 chronic kidney disease, or unspecified chronic kidney disease: Secondary | ICD-10-CM | POA: Diagnosis not present

## 2019-02-10 DIAGNOSIS — K5730 Diverticulosis of large intestine without perforation or abscess without bleeding: Secondary | ICD-10-CM | POA: Diagnosis not present

## 2019-02-10 DIAGNOSIS — M170 Bilateral primary osteoarthritis of knee: Secondary | ICD-10-CM | POA: Diagnosis not present

## 2019-02-10 DIAGNOSIS — K5732 Diverticulitis of large intestine without perforation or abscess without bleeding: Secondary | ICD-10-CM | POA: Diagnosis not present

## 2019-02-10 DIAGNOSIS — I2510 Atherosclerotic heart disease of native coronary artery without angina pectoris: Secondary | ICD-10-CM | POA: Diagnosis not present

## 2019-02-11 ENCOUNTER — Emergency Department (HOSPITAL_BASED_OUTPATIENT_CLINIC_OR_DEPARTMENT_OTHER): Payer: Medicare Other

## 2019-02-11 ENCOUNTER — Emergency Department (HOSPITAL_BASED_OUTPATIENT_CLINIC_OR_DEPARTMENT_OTHER)
Admission: EM | Admit: 2019-02-11 | Discharge: 2019-02-11 | Disposition: A | Discharge status: Home / Self Care | Payer: Medicare Other | Attending: Emergency Medicine | Admitting: Emergency Medicine

## 2019-02-11 ENCOUNTER — Encounter (HOSPITAL_BASED_OUTPATIENT_CLINIC_OR_DEPARTMENT_OTHER): Payer: Self-pay | Admitting: Emergency Medicine

## 2019-02-11 ENCOUNTER — Other Ambulatory Visit: Payer: Self-pay

## 2019-02-11 DIAGNOSIS — I2510 Atherosclerotic heart disease of native coronary artery without angina pectoris: Secondary | ICD-10-CM | POA: Insufficient documentation

## 2019-02-11 DIAGNOSIS — S59902A Unspecified injury of left elbow, initial encounter: Secondary | ICD-10-CM | POA: Diagnosis not present

## 2019-02-11 DIAGNOSIS — M533 Sacrococcygeal disorders, not elsewhere classified: Secondary | ICD-10-CM | POA: Diagnosis present

## 2019-02-11 DIAGNOSIS — Z7901 Long term (current) use of anticoagulants: Secondary | ICD-10-CM | POA: Diagnosis not present

## 2019-02-11 DIAGNOSIS — Y998 Other external cause status: Secondary | ICD-10-CM | POA: Insufficient documentation

## 2019-02-11 DIAGNOSIS — S79912A Unspecified injury of left hip, initial encounter: Secondary | ICD-10-CM | POA: Diagnosis not present

## 2019-02-11 DIAGNOSIS — M25552 Pain in left hip: Secondary | ICD-10-CM | POA: Diagnosis not present

## 2019-02-11 DIAGNOSIS — I129 Hypertensive chronic kidney disease with stage 1 through stage 4 chronic kidney disease, or unspecified chronic kidney disease: Secondary | ICD-10-CM | POA: Diagnosis not present

## 2019-02-11 DIAGNOSIS — M25522 Pain in left elbow: Secondary | ICD-10-CM | POA: Diagnosis not present

## 2019-02-11 DIAGNOSIS — N183 Chronic kidney disease, stage 3 unspecified: Secondary | ICD-10-CM | POA: Insufficient documentation

## 2019-02-11 DIAGNOSIS — Y92018 Other place in single-family (private) house as the place of occurrence of the external cause: Secondary | ICD-10-CM | POA: Insufficient documentation

## 2019-02-11 DIAGNOSIS — Z8673 Personal history of transient ischemic attack (TIA), and cerebral infarction without residual deficits: Secondary | ICD-10-CM | POA: Diagnosis not present

## 2019-02-11 DIAGNOSIS — M8448XA Pathological fracture, other site, initial encounter for fracture: Secondary | ICD-10-CM | POA: Insufficient documentation

## 2019-02-11 DIAGNOSIS — S3219XA Other fracture of sacrum, initial encounter for closed fracture: Secondary | ICD-10-CM | POA: Diagnosis not present

## 2019-02-11 DIAGNOSIS — W19XXXA Unspecified fall, initial encounter: Secondary | ICD-10-CM | POA: Diagnosis not present

## 2019-02-11 DIAGNOSIS — Y9301 Activity, walking, marching and hiking: Secondary | ICD-10-CM | POA: Diagnosis not present

## 2019-02-11 DIAGNOSIS — Z79899 Other long term (current) drug therapy: Secondary | ICD-10-CM | POA: Insufficient documentation

## 2019-02-11 DIAGNOSIS — S0990XA Unspecified injury of head, initial encounter: Secondary | ICD-10-CM | POA: Diagnosis not present

## 2019-02-11 DIAGNOSIS — R109 Unspecified abdominal pain: Secondary | ICD-10-CM | POA: Diagnosis not present

## 2019-02-11 DIAGNOSIS — R42 Dizziness and giddiness: Secondary | ICD-10-CM | POA: Diagnosis not present

## 2019-02-11 DIAGNOSIS — S3991XA Unspecified injury of abdomen, initial encounter: Secondary | ICD-10-CM | POA: Diagnosis not present

## 2019-02-11 LAB — CBC WITH DIFFERENTIAL/PLATELET
Abs Immature Granulocytes: 0.05 10*3/uL (ref 0.00–0.07)
Basophils Absolute: 0 10*3/uL (ref 0.0–0.1)
Basophils Relative: 0 %
Eosinophils Absolute: 0 10*3/uL (ref 0.0–0.5)
Eosinophils Relative: 0 %
HCT: 38 % (ref 36.0–46.0)
Hemoglobin: 11.8 g/dL — ABNORMAL LOW (ref 12.0–15.0)
Immature Granulocytes: 1 %
Lymphocytes Relative: 9 %
Lymphs Abs: 1 10*3/uL (ref 0.7–4.0)
MCH: 29.3 pg (ref 26.0–34.0)
MCHC: 31.1 g/dL (ref 30.0–36.0)
MCV: 94.3 fL (ref 80.0–100.0)
Monocytes Absolute: 0.7 10*3/uL (ref 0.1–1.0)
Monocytes Relative: 6 %
Neutro Abs: 9.1 10*3/uL — ABNORMAL HIGH (ref 1.7–7.7)
Neutrophils Relative %: 84 %
Platelets: 181 10*3/uL (ref 150–400)
RBC: 4.03 MIL/uL (ref 3.87–5.11)
RDW: 14.6 % (ref 11.5–15.5)
WBC: 10.9 10*3/uL — ABNORMAL HIGH (ref 4.0–10.5)
nRBC: 0 % (ref 0.0–0.2)

## 2019-02-11 LAB — URINALYSIS, ROUTINE W REFLEX MICROSCOPIC
Bilirubin Urine: NEGATIVE
Glucose, UA: NEGATIVE mg/dL
Hgb urine dipstick: NEGATIVE
Ketones, ur: NEGATIVE mg/dL
Leukocytes,Ua: NEGATIVE
Nitrite: NEGATIVE
Protein, ur: NEGATIVE mg/dL
Specific Gravity, Urine: 1.01 (ref 1.005–1.030)
pH: 6.5 (ref 5.0–8.0)

## 2019-02-11 LAB — COMPREHENSIVE METABOLIC PANEL
ALT: 22 U/L (ref 0–44)
AST: 22 U/L (ref 15–41)
Albumin: 3.4 g/dL — ABNORMAL LOW (ref 3.5–5.0)
Alkaline Phosphatase: 70 U/L (ref 38–126)
Anion gap: 10 (ref 5–15)
BUN: 20 mg/dL (ref 8–23)
CO2: 27 mmol/L (ref 22–32)
Calcium: 8.6 mg/dL — ABNORMAL LOW (ref 8.9–10.3)
Chloride: 100 mmol/L (ref 98–111)
Creatinine, Ser: 0.93 mg/dL (ref 0.44–1.00)
GFR calc Af Amer: >60 mL/min (ref >60)
GFR calc non Af Amer: 57 mL/min — ABNORMAL LOW (ref >60)
Glucose, Bld: 100 mg/dL — ABNORMAL HIGH (ref 70–99)
Potassium: 3.6 mmol/L (ref 3.5–5.1)
Sodium: 137 mmol/L (ref 135–145)
Total Bilirubin: 1.1 mg/dL (ref 0.3–1.2)
Total Protein: 6.3 g/dL — ABNORMAL LOW (ref 6.5–8.1)

## 2019-02-11 MED ORDER — HYDROCODONE-ACETAMINOPHEN 5-325 MG PO TABS
1.0000 | ORAL_TABLET | Freq: Once | ORAL | Status: AC
  Administered 2019-02-11: 1 via ORAL
  Filled 2019-02-11: qty 1

## 2019-02-11 MED ORDER — IOHEXOL 300 MG/ML  SOLN
100.0000 mL | Freq: Once | INTRAMUSCULAR | Status: AC | PRN
  Administered 2019-02-11: 100 mL via INTRAVENOUS

## 2019-02-11 NOTE — ED Notes (Signed)
Family at bedside. 

## 2019-02-11 NOTE — ED Notes (Signed)
Waiting to ambulate until consult with ortho.

## 2019-02-11 NOTE — ED Notes (Signed)
ED Provider at bedside. 

## 2019-02-11 NOTE — ED Notes (Signed)
Given soda and crackers  

## 2019-02-11 NOTE — ED Notes (Signed)
Assisted to [facility], states has to leave, daughter states has to go as well. Awaiting ED MD to speak to them. Daughter, " WE can't wait" ED MD informed

## 2019-02-11 NOTE — ED Triage Notes (Signed)
She was dizzy and fell last Monday and fell while trying to pick up something off of the floor. C/o L hip pain.

## 2019-02-11 NOTE — ED Notes (Signed)
Patient transported to CT 

## 2019-02-11 NOTE — ED Provider Notes (Signed)
[facility] EMERGENCY DEPARTMENT Provider Note   CSN: [id number] Arrival date & time: 02/11/19  1117     History   Chief Complaint Chief Complaint  Patient presents with  . Fall    HPI Betty Jackson is a 84 y.o. female.     Patient here with left elbow and left hip pain after having a fall 5 days ago.  States she was walking with her walker when she lost her balance and fell onto her left side injuring her elbow and hip.  Does not think she hit her head or loss consciousness.  Daughter states she been having "dizzy  Spells"  on and off for several months but her PCP believes is vertigo.  She does not have any dizziness currently.  She complains of pain to her left hip that is not controlled by her home chronic pain medication of hydrocodone.  She normally walks with a walker.  She denies any weakness in her leg, numbness, tingling.  No loss of control of bowel or bladder.  No fever.  Recent admission to the hospital for diverticulitis but finished those antibiotics and has been tolerating p.o. She does have a history of atrial fibrillation and takes Eliquis.  She is also on chronic steroids for history of giant cell arteritis.  She comes in today with worsening left hip pain but states the elbow is not really bothering her.  The history is provided by the patient and a relative.  Fall Associated symptoms include abdominal pain. Pertinent negatives include no headaches.    Past Medical History:  Diagnosis Date  . Abdominal pain, epigastric 06/22/2013  . Abdominal pain, other specified site 12/24/2011  . Abnormal urinalysis 05/20/2017  . Acute respiratory failure with hypoxia ([place]) 05/20/2017  . Anemia, unspecified 10/28/2012  . Anxiety state, unspecified 10/28/2012  . Bruises easily    d/t being on prednisone and plavix  . CAD (coronary artery disease)    a. Stent RCA in [facility];  b. Cath approx 2009 - nonobs per pt report.  . Cataract    immature on the left eye   . Chronic insomnia 02/07/2013  . Chronic lower back pain    scoliosis  . CKD (chronic kidney disease) stage 3, GFR 30-59 ml/min   . Complication of anesthesia    pt has a very high tolerance to meds  . Coronary disease 05/20/2017  . CVA (cerebral infarction) 10/29/2012  . DDD (degenerative disc disease)   . Depression   . Diverticulitis    hx of  . Diverticulosis   . Elevated transaminase level 02/07/2013  . Enteritis   . GERD (gastroesophageal reflux disease) 09/01/2010  . Giant cell arteritis ([place])   . Hemorrhoids   . Herniated nucleus pulposus, L5-S1, right 11/04/2015  . History of scabies   . HTN (hypertension) 05/20/2017  . Hyperlipidemia    takes Lipitor daily  . Hypertension    takes Amlodipine,Losartan,Metoprolol,and HCTZ daily  . Incontinence of urine   . Influenza A 05/20/2017  . Insomnia    takes Restoril nightly  . Lumbar stenosis 04/25/2013  . Major depressive disorder, recurrent episode, moderate ([place]) 07/16/2013  . Migraines    "back in my 20's; none since" (10/28/2012)  . Osteoporosis   . PAF (paroxysmal atrial fibrillation) ([place]) 2011   a. lone epidode in 2011 according to notes.  . Rheumatoid arthritis ([place])   . Scoliosis   . Sepsis ([place]) 05/20/2017  . Sinus bradycardia  a. on chronic bb  . Temporal arteritis ([place]) 2011   a. followed @ [facility]; potential flareup 10/28/2012/notes 10/28/2012  . Vocal cord dysfunction    "they don't operate properly" (10/28/2012)    Patient Active Problem List   Diagnosis Date Noted  . Atrial fibrillation with rapid ventricular response ([place])   . Nausea 01/17/2019  . Acute diverticulitis 01/13/2019  . AKI (acute kidney injury) ([place]) 01/13/2019  . Chronic pain 01/13/2019  . Tachycardia 01/13/2019  . Hypokalemia 01/13/2019  . Chronic low back pain 07/10/2018  . Influenza A 05/20/2017  . Sepsis ([place]) 05/20/2017  . Acute respiratory failure with hypoxia ([place]) 05/20/2017  . CKD (chronic kidney disease) stage 3, GFR 30-59 ml/min  05/20/2017  . PAF (paroxysmal atrial fibrillation) ([place]) 05/20/2017  . HTN (hypertension) 05/20/2017  . Giant cell arteritis ([place]) 05/20/2017  . Coronary disease 05/20/2017  . Abnormal urinalysis 05/20/2017  . Osteoporosis 05/08/2016  . Herniated nucleus pulposus, L5-S1, right 11/04/2015  . Major depressive disorder, recurrent episode, moderate ([place]) 07/16/2013  . Abdominal pain, epigastric 06/22/2013  . Lumbar stenosis 04/25/2013  . Chronic insomnia 02/07/2013  . Elevated transaminase level 02/07/2013  . CVA (cerebral infarction) 10/29/2012  . Visual changes 10/28/2012  . Depression 10/28/2012  . Anxiety state, unspecified 10/28/2012  . Anemia, unspecified 10/28/2012  . Nonspecific (abnormal) findings on radiological and other examination of gastrointestinal tract 01/25/2012  . Hyponatremia 01/24/2012  . Hematuria 01/24/2012  . Enteritis 12/24/2011  . Abdominal pain, other specified site 12/24/2011  . Leg pain, right 02/18/2011  . Temporal arteritis ([place]) 09/01/2010  . Hypertension 09/01/2010  . Hyperlipidemia 09/01/2010  . History of atrial fibrillation 09/01/2010  . CAD (coronary artery disease) 09/01/2010  . GERD (gastroesophageal reflux disease) 09/01/2010  . Insomnia 09/01/2010    Past Surgical History:  Procedure Laterality Date  . ABDOMINAL HYSTERECTOMY  ~ 1984   vaginally  . BACK SURGERY  7-80yrs ago   X Stop  . BLADDER SUSPENSION  2001  . BREAST BIOPSY Right   . CATARACT EXTRACTION W/ INTRAOCULAR LENS IMPLANT Right ~ 08/2012  . COLONOSCOPY  01/26/2012   Procedure: COLONOSCOPY;  Surgeon: Ladene Artist, MD,FACG;  Location: [facility] ENDOSCOPY;  Service: Endoscopy;  Laterality: N/A;  note the EGD is possible  . CORONARY ANGIOPLASTY WITH STENT PLACEMENT  2006   X 1 stent  . EPIDURAL BLOCK INJECTION    . ESOPHAGOGASTRODUODENOSCOPY  01/26/2012   Procedure: ESOPHAGOGASTRODUODENOSCOPY (EGD);  Surgeon: Ladene Artist, MD,FACG;  Location: [facility] ENDOSCOPY;  Service: Endoscopy;   Laterality: N/A;  . HEMIARTHROPLASTY HIP Right 2012  . LAPAROSCOPIC CHOLECYSTECTOMY  2001  . LUMBAR FUSION  03/2013  . LUMBAR LAMINECTOMY/DECOMPRESSION MICRODISCECTOMY Right 11/04/2015   Procedure: Right Lumbar Five-Sacral One Microdiskectomy;  Surgeon: Kristeen Miss, MD;  Location: [facility] NEURO ORS;  Service: Neurosurgery;  Laterality: Right;  Right L5-S1 Microdiskectomy  . moles removed that required stiches     one on leg and one on face  . TEMPORAL ARTERY BIOPSY / LIGATION Bilateral 2011  . TONSILLECTOMY AND ADENOIDECTOMY     at age 79  . TUBAL LIGATION  ~ 1982  . X-STOP IMPLANTATION  ~ 2010   "lower back" (10/28/2012)     OB History   No obstetric history on file.      Home Medications    Prior to Admission medications   Medication Sig Start Date End Date Taking? Authorizing Provider  amoxicillin (AMOXIL) 500 MG capsule TAKE 4 CAPSULES BY MOUTH 1 HOUR BEFORE DENTAL  PROCEDURE 01/03/19   [provider]  cholecalciferol (VITAMIN D) 1000 UNITS tablet Take 1,000 Units by mouth daily.     [provider]  dicyclomine (BENTYL) 20 MG tablet Take 1 tablet (20 mg total) by mouth 2 (two) times daily as needed for spasms. 01/12/19   Gareth Morgan, MD  diltiazem (CARDIZEM) 60 MG tablet Take 1 tablet (60 mg total) by mouth 3 (three) times daily. 01/31/19   Burnell Blanks, MD  ELIQUIS 5 MG TABS tablet TAKE 1 TABLET BY MOUTH TWICE A DAY Patient taking differently: Take 5 mg by mouth 2 (two) times daily.  11/15/18   Burnell Blanks, MD  escitalopram (LEXAPRO) 10 MG tablet TAKE 1 TABLET BY MOUTH EVERY DAY Patient taking differently: Take 10 mg by mouth daily.  12/14/18   Burchette, Alinda Sierras, MD  furosemide (LASIX) 40 MG tablet Take 1 tablet (40mg ) by mouth twice a day and an extra 1/2 tablet (20mg ) twice a day as needed for edema. 01/20/19   Rai, Vernelle Emerald, MD  HYDROcodone-acetaminophen (NORCO/VICODIN) 5-325 MG tablet Take one tablet by mouth every 8 hours as needed for  pain. May refill in two months. Patient taking differently: Take 1 tablet by mouth every 6 (six) hours as needed for moderate pain.  08/05/18   Burchette, Alinda Sierras, MD  losartan (COZAAR) 25 MG tablet Take 1 tablet (25 mg total) by mouth daily. 01/21/19   Rai, Vernelle Emerald, MD  metoprolol tartrate (LOPRESSOR) 100 MG tablet Take 1 tablet (100 mg total) by mouth 2 (two) times daily. 01/20/19   Rai, Ripudeep K, MD  ondansetron (ZOFRAN ODT) 4 MG disintegrating tablet Take 1 tablet (4 mg total) by mouth every 8 (eight) hours as needed for nausea or vomiting. 01/12/19   Gareth Morgan, MD  pantoprazole (PROTONIX) 40 MG tablet Take 1 tablet (40 mg total) by mouth daily. 01/11/19   Burchette, Alinda Sierras, MD  potassium chloride SA (KLOR-CON) 20 MEQ tablet Take 1 tablet (20 mEq total) by mouth daily. 01/20/19   Rai, Ripudeep K, MD  predniSONE (DELTASONE) 10 MG tablet 4 tablets for 3 days, 3 tablets for 3 days, 2 tablets for 3 days, 1 tablet continue Patient taking differently: Take 10 mg by mouth daily.  05/26/17   Reyne Dumas, MD  vitamin C (ASCORBIC ACID) 500 MG tablet Take 1,000 mg by mouth daily as needed (for sore throat).     [provider]  vitamin E (VITAMIN E) 400 UNIT capsule Take 400 Units by mouth daily.    [provider]    Family History Family History  Problem Relation Age of Onset  . Brain cancer Mother 11  . Heart attack Father   . Breast cancer Daughter 7  . Heart disease Other   . Hypertension Other   . Arthritis Neg Hx   . Colon cancer Neg Hx   . Osteoporosis Neg Hx     Social History Social History   Tobacco Use  . Smoking status: Never Smoker  . Smokeless tobacco: Never Used  Substance Use Topics  . Alcohol use: Yes    Comment: socially  . Drug use: No     Allergies   Dextromethorphan   Review of Systems Review of Systems  Constitutional: Negative for activity change and appetite change.  HENT: Negative for congestion.   Eyes: Negative for  visual disturbance.  Gastrointestinal: Positive for abdominal pain. Negative for diarrhea, nausea and vomiting.  Genitourinary: Negative for dysuria and hematuria.  Musculoskeletal: Positive for arthralgias, back pain and myalgias.  Skin: Negative for rash.  Neurological: Positive for dizziness and weakness. Negative for light-headedness and headaches.    all other systems are negative except as noted in the HPI and PMH.    Physical Exam Updated Vital Signs BP (!) 154/66 (BP Location: Left Arm)   Pulse 63   Temp 98.4 F (36.9 C) (Oral)   Resp 20   Ht 5' 4.5" (1.638 m)   Wt 69.4 kg   SpO2 96%   BMI 25.86 kg/m   Physical Exam Vitals signs and nursing note reviewed.  Constitutional:      General: She is not in acute distress.    Appearance: She is well-developed.  HENT:     Head: Normocephalic and atraumatic.     Mouth/Throat:     Pharynx: No oropharyngeal exudate.  Eyes:     Conjunctiva/sclera: Conjunctivae normal.     Pupils: Pupils are equal, round, and reactive to light.  Neck:     Musculoskeletal: Normal range of motion and neck supple.     Comments: No meningismus. Cardiovascular:     Rate and Rhythm: Normal rate and regular rhythm.     Heart sounds: Normal heart sounds. No murmur.  Pulmonary:     Effort: Pulmonary effort is normal. No respiratory distress.     Breath sounds: Normal breath sounds.  Abdominal:     Palpations: Abdomen is soft.     Tenderness: There is abdominal tenderness. There is no guarding or rebound.     Comments: Mild right lower quadrant tenderness, no guarding or rebound  Musculoskeletal: Normal range of motion.        General: No tenderness.     Comments: Paraspinal lumbar tenderness, no midline tenderness  There is pain at the left posterior hip and SI joint.  She is able to flex and extend the hips and knees bilaterally without difficulty. Pelvis is stable.  No bony tenderness to left elbow with intact range of motion.  Skin:     General: Skin is warm.     Capillary Refill: Capillary refill takes less than 2 seconds.  Neurological:     Mental Status: She is alert and oriented to person, place, and time.     Cranial Nerves: No cranial nerve deficit.     Motor: No abnormal muscle tone.     Coordination: Coordination normal.     Comments: CN 2-12 intact, no ataxia on finger to nose, no nystagmus, 5/5 strength throughout, no pronator drift, no appreciable nystagmus.  No ataxia on finger-to-nose.  Psychiatric:        Behavior: Behavior normal.      ED Treatments / Results  Labs (all labs ordered are listed, but only abnormal results are displayed) Labs Reviewed  CBC WITH DIFFERENTIAL/PLATELET - Abnormal; Notable for the following components:      Result Value   WBC 10.9 (*)    Hemoglobin 11.8 (*)    Neutro Abs 9.1 (*)    All other components within normal limits  COMPREHENSIVE METABOLIC PANEL - Abnormal; Notable for the following components:   Glucose, Bld 100 (*)    Calcium 8.6 (*)    Total Protein 6.3 (*)    Albumin 3.4 (*)    GFR calc non Af Amer 57 (*)    All other components within normal limits  URINALYSIS, ROUTINE W REFLEX MICROSCOPIC    EKG EKG Interpretation  Date/Time:  Saturday February 11 2019 12:29:20 EST Ventricular  Rate:  54 PR Interval:    QRS Duration: 116 QT Interval:  468 QTC Calculation: 444 R Axis:   -7 Text Interpretation: Sinus rhythm Borderline prolonged PR interval Nonspecific intraventricular conduction delay Borderline T abnormalities, anterior leads No significant change was found Confirmed by Ezequiel Essex [phone]) on 02/11/2019 12:43:49 PM   Radiology Dg Elbow Complete Left  Result Date: 02/11/2019 CLINICAL DATA:  Left elbow pain after recent fall EXAM: LEFT ELBOW - COMPLETE 3+ VIEW COMPARISON:  None. FINDINGS: There is no evidence of fracture, dislocation, or joint effusion. There is no evidence of arthropathy or other focal bone abnormality. Soft tissues are  unremarkable. IMPRESSION: No left elbow fracture, joint effusion or malalignment. Electronically Signed   By: Ilona Sorrel M.D.   On: 02/11/2019 14:57   Ct Head Wo Contrast  Result Date: 02/11/2019 CLINICAL DATA:  Recent fall 5 days prior.  Dizziness. EXAM: CT HEAD WITHOUT CONTRAST TECHNIQUE: Contiguous axial images were obtained from the base of the skull through the vertex without intravenous contrast. COMPARISON:  08/17/2018 head CT. FINDINGS: Brain: No evidence of parenchymal hemorrhage or extra-axial fluid collection. No mass lesion, mass effect, or midline shift. No CT evidence of acute infarction. Nonspecific prominent subcortical and periventricular white matter hypodensity, most in keeping with chronic small vessel ischemic change. Generalized cerebral volume loss. No ventriculomegaly. Vascular: No acute abnormality. Skull: No evidence of calvarial fracture. Sinuses/Orbits: The visualized paranasal sinuses are essentially clear. Other:  The mastoid air cells are unopacified. IMPRESSION: 1. No evidence of acute intracranial abnormality. No evidence of calvarial fracture. 2. Generalized cerebral volume loss and prominent chronic small vessel ischemic changes in the cerebral white matter. Electronically Signed   By: Ilona Sorrel M.D.   On: 02/11/2019 14:38   Ct Abdomen Pelvis W Contrast  Result Date: 02/11/2019 CLINICAL DATA:  Fall 5 days ago, right-sided tenderness. EXAM: CT ABDOMEN AND PELVIS WITH CONTRAST TECHNIQUE: Multidetector CT imaging of the abdomen and pelvis was performed using the standard protocol following bolus administration of intravenous contrast. CONTRAST:  132mL OMNIPAQUE IOHEXOL 300 MG/ML  SOLN COMPARISON:  01/12/2019 FINDINGS: Lower chest: Descending thoracic aortic and coronary atherosclerosis. Mild lingular scarring. Hepatobiliary: Stable mild intrahepatic biliary dilatation. Common bile duct 1.1 cm, formerly the same. Cholecystectomy noted. Pancreas: Unremarkable Spleen:  Unremarkable Adrenals/Urinary Tract: Portions of the urinary bladder and right distal ureter are obscured by streak artifact from the patient's right hip implant. Small hypodense lesions of the left kidney are likely cysts but technically too small to characterize. Otherwise unremarkable. Stomach/Bowel: Sigmoid colon diverticulosis. Vascular/Lymphatic: Aortoiliac atherosclerotic vascular disease. Reproductive: Uterus absent. Adnexa unremarkable, right adnexa partially obscured by streak artifact. Other: No supplemental non-categorized findings. Musculoskeletal: Vertical bands of sclerosis in both sacral ala, right greater than left, suspicious for sacral insufficiency fracture. Posterolateral rod and pedicle screw fixation L2-L3-L4 bilaterally with posterior decompression. Prior compression fracture and vertebral augmentation at T11. Spondylosis and degenerative disc disease contribute to right foraminal impingement at L1-2 and L5-S1, and left foraminal impingement at T10-11 and T11-12. 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 IMPRESSION: 1. Suspected bilateral sacral insufficiency fractures, right greater than left. 2. Sigmoid colon diverticulosis, prior diverticulitis has resolved. 3. Chronic mild intrahepatic and extrahepatic biliary dilatation, some of which may be a physiologic response to cholecystectomy. 4. Other imaging findings of potential clinical significance: Coronary atherosclerosis. Lumbar spondylosis and degenerative disc disease causing impingement at T10-11, L1-2, L5-S1, and T11-12. Morgagni hernia containing adipose tissue. Aortic Atherosclerosis (ICD10-I70.0). Electronically Signed  By: Van Clines M.D.   On: 02/11/2019 14:48   Dg Hip Unilat W Or Wo Pelvis 2-3 Views Left  Result Date: 02/11/2019 CLINICAL DATA:  Bilateral hip pain after recent fall, left greater than right EXAM: DG HIP (WITH OR WITHOUT PELVIS) 2-3V LEFT COMPARISON:  08/17/2018  hip radiographs FINDINGS: Right hip hemiarthroplasty, partially visualized, with no evidence of hardware fracture or loosening. No pelvic fracture or diastasis. No left hip fracture or dislocation. Stable mild left hip osteoarthritis. Excreted contrast noted in the bladder. Partially visualized bilateral posterior spinal fusion hardware in the lower lumbar spine. No suspicious focal osseous lesions. IMPRESSION: No pelvic fracture. No left hip dislocation. Stable mild left hip osteoarthritis. Partially visualized right hip hemiarthroplasty. Electronically Signed   By: Ilona Sorrel M.D.   On: 02/11/2019 14:56    Procedures Procedures (including critical care time)  Medications Ordered in ED Medications  HYDROcodone-acetaminophen (NORCO/VICODIN) 5-325 MG per tablet 1 tablet (has no administration in time range)     Initial Impression / Assessment and Plan / ED Course  I have reviewed the triage vital signs and the nursing notes.  Pertinent labs & imaging results that were available during my care of the patient were reviewed by me and considered in my medical decision making (see chart for details).       Left hip and elbow pain after a fall 5 days ago.  Intermittent dizzy spells but none currently.  Does take Eliquis as well as chronic prednisone.  We will obtain imaging of her elbow and hip as well as her head given her history of anticoagulation use.  Hip and elbow xray negative. Labs reassuring with negative UA. CT a/p obtained given new R sided abdominal pain as well as for further evaluation of hip pain.   CT shows intact hips but suspected sacral insufficiency fractures. CT head negative.  Will discuss sacral fractures with orthopedics.  Patient has walker as well as pain medication at home.  No change in chronic back pain. Low suspicion for cord compression or cauda equina.  Dr. Vanita Panda to assume care at shift change.  Final Clinical Impressions(s) / ED Diagnoses   Final  diagnoses:  None    ED Discharge Orders    None       Jaesean Litzau, Annie Main, MD 02/11/19 1708

## 2019-02-11 NOTE — ED Provider Notes (Signed)
Patient with diagnosis of sacral fracture, remains ambulatory with a walker, left prior to consultation return phone call, apparently due to frustration about delay.    Carmin Muskrat, MD 02/11/19 1730

## 2019-02-13 ENCOUNTER — Telehealth (INDEPENDENT_AMBULATORY_CARE_PROVIDER_SITE_OTHER): Payer: Medicare Other | Admitting: Family Medicine

## 2019-02-13 ENCOUNTER — Other Ambulatory Visit: Payer: Self-pay

## 2019-02-13 DIAGNOSIS — S3210XA Unspecified fracture of sacrum, initial encounter for closed fracture: Secondary | ICD-10-CM | POA: Diagnosis not present

## 2019-02-13 DIAGNOSIS — R42 Dizziness and giddiness: Secondary | ICD-10-CM | POA: Diagnosis not present

## 2019-02-13 NOTE — Progress Notes (Signed)
This visit type was conducted due to national recommendations for restrictions regarding the COVID-19 pandemic in an effort to limit this patient's exposure and mitigate transmission in our community.   Virtual Visit via Video Note  I connected with Betty Jackson on 02/13/19 at 11:30 AM EST by a video enabled telemedicine application and verified that I am speaking with the correct person using two identifiers.  Location patient: home Location provider:work or home office Persons participating in the virtual visit: patient, provider  I discussed the limitations of evaluation and management by telemedicine and the availability of in person appointments. The patient expressed understanding and agreed to proceed.   HPI:  Betty Jackson had recent fall at home.  This happened around 9 November.  She was in her kitchen with her walker and dropped a piece of mail and basically leaned over to pick this up and kept falling.  She was aware of some low back pain following the fall.  She also hit her left elbow.  She went to ER and had multiple x-rays done.  There was no reported head injury or loss of consciousness.  CT head no acute findings.  CT abdomen pelvis revealed bilateral insufficiency fractures of the sacrum right greater than left.  Pelvic x-rays no acute fracture.  Left elbow unremarkable.  CBC and comprehensive metabolic panel as well as urinalysis unremarkable.  Albumin had improved from 2.5 month ago to 3.4  She is having moderate pain.  She is on hydrocodone chronically for chronic low back pain not improved with multiple therapies including injection therapy and surgery..  She has home PT and OT as well as home health aide coming out since her last hospitalization.  She is doing mostly bed rest.  Ambulation with walker at all times.  Regarding her vertigo type symptoms she has had these for about 5 weeks now.  No sudden hearing changes.  Daughter had called last week requesting ENT referral.  They  did not describe any focal weakness, dysphagia, or any speech changes.  No headaches or acute vision changes.  Past Medical History:  Diagnosis Date  . Abdominal pain, epigastric 06/22/2013  . Abdominal pain, other specified site 12/24/2011  . Abnormal urinalysis 05/20/2017  . Acute respiratory failure with hypoxia ([place]) 05/20/2017  . Anemia, unspecified 10/28/2012  . Anxiety state, unspecified 10/28/2012  . Bruises easily    d/t being on prednisone and plavix  . CAD (coronary artery disease)    a. Stent RCA in [facility];  b. Cath approx 2009 - nonobs per pt report.  . Cataract    immature on the left eye  . Chronic insomnia 02/07/2013  . Chronic lower back pain    scoliosis  . CKD (chronic kidney disease) stage 3, GFR 30-59 ml/min   . Complication of anesthesia    pt has a very high tolerance to meds  . Coronary disease 05/20/2017  . CVA (cerebral infarction) 10/29/2012  . DDD (degenerative disc disease)   . Depression   . Diverticulitis    hx of  . Diverticulosis   . Elevated transaminase level 02/07/2013  . Enteritis   . GERD (gastroesophageal reflux disease) 09/01/2010  . Giant cell arteritis ([place])   . Hemorrhoids   . Herniated nucleus pulposus, L5-S1, right 11/04/2015  . History of scabies   . HTN (hypertension) 05/20/2017  . Hyperlipidemia    takes Lipitor daily  . Hypertension    takes Amlodipine,Losartan,Metoprolol,and HCTZ daily  . Incontinence of urine   .  Influenza A 05/20/2017  . Insomnia    takes Restoril nightly  . Lumbar stenosis 04/25/2013  . Major depressive disorder, recurrent episode, moderate ([place]) 07/16/2013  . Migraines    "back in my 20's; none since" (10/28/2012)  . Osteoporosis   . PAF (paroxysmal atrial fibrillation) ([place]) 2011   a. lone epidode in 2011 according to notes.  . Rheumatoid arthritis ([place])   . Scoliosis   . Sepsis ([place]) 05/20/2017  . Sinus bradycardia    a. on chronic bb  . Temporal arteritis ([place]) 2011   a. followed @ [facility]; potential  flareup 10/28/2012/notes 10/28/2012  . Vocal cord dysfunction    "they don't operate properly" (10/28/2012)   Past Surgical History:  Procedure Laterality Date  . ABDOMINAL HYSTERECTOMY  ~ 1984   vaginally  . BACK SURGERY  7-93yrs ago   X Stop  . BLADDER SUSPENSION  2001  . BREAST BIOPSY Right   . CATARACT EXTRACTION W/ INTRAOCULAR LENS IMPLANT Right ~ 08/2012  . COLONOSCOPY  01/26/2012   Procedure: COLONOSCOPY;  Surgeon: Ladene Artist, MD,FACG;  Location[facility] ENDOSCOPY;  Service: Endoscopy;  Laterality: N/A;  note the EGD is possible  . CORONARY ANGIOPLASTY WITH STENT PLACEMENT  2006   X 1 stent  . EPIDURAL BLOCK INJECTION    . ESOPHAGOGASTRODUODENOSCOPY  01/26/2012   Procedure: ESOPHAGOGASTRODUODENOSCOPY (EGD);  Surgeon: Ladene Artist, MD,FACG;  Location: [facility] ENDOSCOPY;  Service: Endoscopy;  Laterality: N/A;  . HEMIARTHROPLASTY HIP Right 2012  . LAPAROSCOPIC CHOLECYSTECTOMY  2001  . LUMBAR FUSION  03/2013  . LUMBAR LAMINECTOMY/DECOMPRESSION MICRODISCECTOMY Right 11/04/2015   Procedure: Right Lumbar Five-Sacral One Microdiskectomy;  Surgeon: Kristeen Miss, MD;  Location: [place] NEURO ORS;  Service: Neurosurgery;  Laterality: Right;  Right L5-S1 Microdiskectomy  . moles removed that required stiches     one on leg and one on face  . TEMPORAL ARTERY BIOPSY / LIGATION Bilateral 2011  . TONSILLECTOMY AND ADENOIDECTOMY     at age 56  . TUBAL LIGATION  ~ 1982  . X-STOP IMPLANTATION  ~ 2010   "lower back" (10/28/2012)    reports that she has never smoked. She has never used smokeless tobacco. She reports current alcohol use. She reports that she does not use drugs. family history includes Brain cancer (age of onset: 50) in her mother; Breast cancer (age of onset: 53) in her daughter; Heart attack in her father; Heart disease in an other family member; Hypertension in an other family member. Allergies  Allergen Reactions  . Dextromethorphan Rash     ROS: See pertinent positives and negatives per  HPI.  Past Medical History:  Diagnosis Date  . Abdominal pain, epigastric 06/22/2013  . Abdominal pain, other specified site 12/24/2011  . Abnormal urinalysis 05/20/2017  . Acute respiratory failure with hypoxia ([place]) 05/20/2017  . Anemia, unspecified 10/28/2012  . Anxiety state, unspecified 10/28/2012  . Bruises easily    d/t being on prednisone and plavix  . CAD (coronary artery disease)    a. Stent RCA in [facility];  b. Cath approx 2009 - nonobs per pt report.  . Cataract    immature on the left eye  . Chronic insomnia 02/07/2013  . Chronic lower back pain    scoliosis  . CKD (chronic kidney disease) stage 3, GFR 30-59 ml/min   . Complication of anesthesia    pt has a very high tolerance to meds  . Coronary disease 05/20/2017  . CVA (cerebral infarction) 10/29/2012  .  DDD (degenerative disc disease)   . Depression   . Diverticulitis    hx of  . Diverticulosis   . Elevated transaminase level 02/07/2013  . Enteritis   . GERD (gastroesophageal reflux disease) 09/01/2010  . Giant cell arteritis ([place])   . Hemorrhoids   . Herniated nucleus pulposus, L5-S1, right 11/04/2015  . History of scabies   . HTN (hypertension) 05/20/2017  . Hyperlipidemia    takes Lipitor daily  . Hypertension    takes Amlodipine,Losartan,Metoprolol,and HCTZ daily  . Incontinence of urine   . Influenza A 05/20/2017  . Insomnia    takes Restoril nightly  . Lumbar stenosis 04/25/2013  . Major depressive disorder, recurrent episode, moderate ([place]) 07/16/2013  . Migraines    "back in my 20's; none since" (10/28/2012)  . Osteoporosis   . PAF (paroxysmal atrial fibrillation) ([place]) 2011   a. lone epidode in 2011 according to notes.  . Rheumatoid arthritis ([place])   . Scoliosis   . Sepsis ([place]) 05/20/2017  . Sinus bradycardia    a. on chronic bb  . Temporal arteritis ([place]) 2011   a. followed @ [facility]; potential flareup 10/28/2012/notes 10/28/2012  . Vocal cord dysfunction    "they don't operate properly" (10/28/2012)     Past Surgical History:  Procedure Laterality Date  . ABDOMINAL HYSTERECTOMY  ~ 1984   vaginally  . BACK SURGERY  7-21yrs ago   X Stop  . BLADDER SUSPENSION  2001  . BREAST BIOPSY Right   . CATARACT EXTRACTION W/ INTRAOCULAR LENS IMPLANT Right ~ 08/2012  . COLONOSCOPY  01/26/2012   Procedure: COLONOSCOPY;  Surgeon: Ladene Artist, MD,FACG;  Location: [facility] ENDOSCOPY;  Service: Endoscopy;  Laterality: N/A;  note the EGD is possible  . CORONARY ANGIOPLASTY WITH STENT PLACEMENT  2006   X 1 stent  . EPIDURAL BLOCK INJECTION    . ESOPHAGOGASTRODUODENOSCOPY  01/26/2012   Procedure: ESOPHAGOGASTRODUODENOSCOPY (EGD);  Surgeon: Ladene Artist, MD,FACG;  Location: [facility] ENDOSCOPY;  Service: Endoscopy;  Laterality: N/A;  . HEMIARTHROPLASTY HIP Right 2012  . LAPAROSCOPIC CHOLECYSTECTOMY  2001  . LUMBAR FUSION  03/2013  . LUMBAR LAMINECTOMY/DECOMPRESSION MICRODISCECTOMY Right 11/04/2015   Procedure: Right Lumbar Five-Sacral One Microdiskectomy;  Surgeon: Kristeen Miss, MD;  Location: [place] NEURO ORS;  Service: Neurosurgery;  Laterality: Right;  Right L5-S1 Microdiskectomy  . moles removed that required stiches     one on leg and one on face  . TEMPORAL ARTERY BIOPSY / LIGATION Bilateral 2011  . TONSILLECTOMY AND ADENOIDECTOMY     at age 55  . TUBAL LIGATION  ~ 1982  . X-STOP IMPLANTATION  ~ 2010   "lower back" (10/28/2012)    Family History  Problem Relation Age of Onset  . Brain cancer Mother 42  . Heart attack Father   . Breast cancer Daughter 13  . Heart disease Other   . Hypertension Other   . Arthritis Neg Hx   . Colon cancer Neg Hx   . Osteoporosis Neg Hx     SOCIAL HX: Lives at home with her husband.  Both are very debilitated.  2 daughters which are very involved with her care   Current Outpatient Medications:  .  amoxicillin (AMOXIL) 500 MG capsule, TAKE 4 CAPSULES BY MOUTH 1 HOUR BEFORE DENTAL PROCEDURE, Disp: , Rfl:  .  cholecalciferol (VITAMIN D) 1000 UNITS tablet, Take 1,000  Units by mouth daily. , Disp: , Rfl:  .  dicyclomine (BENTYL) 20 MG tablet, Take 1 tablet (20 mg  total) by mouth 2 (two) times daily as needed for spasms., Disp: 20 tablet, Rfl: 0 .  diltiazem (CARDIZEM) 60 MG tablet, Take 1 tablet (60 mg total) by mouth 3 (three) times daily., Disp: 90 tablet, Rfl: 6 .  ELIQUIS 5 MG TABS tablet, TAKE 1 TABLET BY MOUTH TWICE A DAY (Patient taking differently: Take 5 mg by mouth 2 (two) times daily. ), Disp: 60 tablet, Rfl: 5 .  escitalopram (LEXAPRO) 10 MG tablet, TAKE 1 TABLET BY MOUTH EVERY DAY (Patient taking differently: Take 10 mg by mouth daily. ), Disp: 90 tablet, Rfl: 3 .  furosemide (LASIX) 40 MG tablet, Take 1 tablet (40mg ) by mouth twice a day and an extra 1/2 tablet (20mg ) twice a day as needed for edema., Disp: 135 tablet, Rfl: 3 .  HYDROcodone-acetaminophen (NORCO/VICODIN) 5-325 MG tablet, Take one tablet by mouth every 8 hours as needed for pain. May refill in two months. (Patient taking differently: Take 1 tablet by mouth every 6 (six) hours as needed for moderate pain. ), Disp: 90 tablet, Rfl: 0 .  losartan (COZAAR) 25 MG tablet, Take 1 tablet (25 mg total) by mouth daily., Disp: 30 tablet, Rfl: 3 .  metoprolol tartrate (LOPRESSOR) 100 MG tablet, Take 1 tablet (100 mg total) by mouth 2 (two) times daily., Disp: 60 tablet, Rfl: 2 .  ondansetron (ZOFRAN ODT) 4 MG disintegrating tablet, Take 1 tablet (4 mg total) by mouth every 8 (eight) hours as needed for nausea or vomiting., Disp: 20 tablet, Rfl: 0 .  pantoprazole (PROTONIX) 40 MG tablet, Take 1 tablet (40 mg total) by mouth daily., Disp: 30 tablet, Rfl: 3 .  potassium chloride SA (KLOR-CON) 20 MEQ tablet, Take 1 tablet (20 mEq total) by mouth daily., Disp: 90 tablet, Rfl: 1 .  predniSONE (DELTASONE) 10 MG tablet, 4 tablets for 3 days, 3 tablets for 3 days, 2 tablets for 3 days, 1 tablet continue (Patient taking differently: Take 10 mg by mouth daily. ), Disp: 90 tablet, Rfl: 1 .  vitamin C (ASCORBIC  ACID) 500 MG tablet, Take 1,000 mg by mouth daily as needed (for sore throat). , Disp: , Rfl:  .  vitamin E (VITAMIN E) 400 UNIT capsule, Take 400 Units by mouth daily., Disp: , Rfl:   EXAM:  VITALS per patient if applicable:  GENERAL: alert, oriented, appears well and in no acute distress  HEENT: atraumatic, conjunttiva clear, no obvious abnormalities on inspection of external nose and ears  NECK: normal movements of the head and neck  LUNGS: on inspection no signs of respiratory distress, breathing rate appears normal, no obvious gross SOB, gasping or wheezing  CV: no obvious cyanosis  MS: moves all visible extremities without noticeable abnormality  PSYCH/NEURO: pleasant and cooperative, no obvious depression or anxiety, speech and thought processing grossly intact  ASSESSMENT AND PLAN:  Discussed the following assessment and plan:  #1 history of frequent falls.  Recent fall as above with bilateral sacral fractures -Discussed fall prevention -Walker use at all times -We will look into extending home health services for home health aide and physical therapy.  They are currently using [facility]. -Continue hydrocodone for pain control.  She has chronic back pain even prior to her recent fall which has been relatively stable.  Her current sacral pain is mostly with movement but not particularly at rest  #2 chronic atrial fibrillation on Eliquis  #3 vertigo type symptoms now about 5-week duration.  Not improving with Epley maneuvers -We discussed possible neurology  referral given persistence of symptoms     I discussed the assessment and treatment plan with the patient. The patient was provided an opportunity to ask questions and all were answered. The patient agreed with the plan and demonstrated an understanding of the instructions.   The patient was advised to call back or seek an in-person evaluation if the symptoms worsen or if the condition fails to improve as  anticipated.     Carolann Littler, MD

## 2019-02-14 DIAGNOSIS — I130 Hypertensive heart and chronic kidney disease with heart failure and stage 1 through stage 4 chronic kidney disease, or unspecified chronic kidney disease: Secondary | ICD-10-CM | POA: Diagnosis not present

## 2019-02-14 DIAGNOSIS — K5730 Diverticulosis of large intestine without perforation or abscess without bleeding: Secondary | ICD-10-CM | POA: Diagnosis not present

## 2019-02-14 DIAGNOSIS — M170 Bilateral primary osteoarthritis of knee: Secondary | ICD-10-CM | POA: Diagnosis not present

## 2019-02-14 DIAGNOSIS — I2510 Atherosclerotic heart disease of native coronary artery without angina pectoris: Secondary | ICD-10-CM | POA: Diagnosis not present

## 2019-02-14 DIAGNOSIS — K5732 Diverticulitis of large intestine without perforation or abscess without bleeding: Secondary | ICD-10-CM | POA: Diagnosis not present

## 2019-02-15 ENCOUNTER — Other Ambulatory Visit: Payer: Self-pay | Admitting: Family Medicine

## 2019-02-15 DIAGNOSIS — K5732 Diverticulitis of large intestine without perforation or abscess without bleeding: Secondary | ICD-10-CM | POA: Diagnosis not present

## 2019-02-15 DIAGNOSIS — M170 Bilateral primary osteoarthritis of knee: Secondary | ICD-10-CM | POA: Diagnosis not present

## 2019-02-15 DIAGNOSIS — K5730 Diverticulosis of large intestine without perforation or abscess without bleeding: Secondary | ICD-10-CM | POA: Diagnosis not present

## 2019-02-15 DIAGNOSIS — I2510 Atherosclerotic heart disease of native coronary artery without angina pectoris: Secondary | ICD-10-CM | POA: Diagnosis not present

## 2019-02-15 DIAGNOSIS — I130 Hypertensive heart and chronic kidney disease with heart failure and stage 1 through stage 4 chronic kidney disease, or unspecified chronic kidney disease: Secondary | ICD-10-CM | POA: Diagnosis not present

## 2019-02-17 DIAGNOSIS — K5730 Diverticulosis of large intestine without perforation or abscess without bleeding: Secondary | ICD-10-CM | POA: Diagnosis not present

## 2019-02-17 DIAGNOSIS — M170 Bilateral primary osteoarthritis of knee: Secondary | ICD-10-CM | POA: Diagnosis not present

## 2019-02-17 DIAGNOSIS — I2510 Atherosclerotic heart disease of native coronary artery without angina pectoris: Secondary | ICD-10-CM | POA: Diagnosis not present

## 2019-02-17 DIAGNOSIS — K5732 Diverticulitis of large intestine without perforation or abscess without bleeding: Secondary | ICD-10-CM | POA: Diagnosis not present

## 2019-02-17 DIAGNOSIS — I130 Hypertensive heart and chronic kidney disease with heart failure and stage 1 through stage 4 chronic kidney disease, or unspecified chronic kidney disease: Secondary | ICD-10-CM | POA: Diagnosis not present

## 2019-02-21 ENCOUNTER — Telehealth: Payer: Self-pay | Admitting: Family Medicine

## 2019-02-21 DIAGNOSIS — I130 Hypertensive heart and chronic kidney disease with heart failure and stage 1 through stage 4 chronic kidney disease, or unspecified chronic kidney disease: Secondary | ICD-10-CM | POA: Diagnosis not present

## 2019-02-21 DIAGNOSIS — I2510 Atherosclerotic heart disease of native coronary artery without angina pectoris: Secondary | ICD-10-CM | POA: Diagnosis not present

## 2019-02-21 DIAGNOSIS — K5730 Diverticulosis of large intestine without perforation or abscess without bleeding: Secondary | ICD-10-CM | POA: Diagnosis not present

## 2019-02-21 DIAGNOSIS — K5732 Diverticulitis of large intestine without perforation or abscess without bleeding: Secondary | ICD-10-CM | POA: Diagnosis not present

## 2019-02-21 DIAGNOSIS — M170 Bilateral primary osteoarthritis of knee: Secondary | ICD-10-CM | POA: Diagnosis not present

## 2019-02-21 NOTE — Telephone Encounter (Signed)
Home Health Verbal Orders - Caller/Agency: [facility] Number: [phone] Requesting PT Frequency: continue 1x a week for 4 weeks  and a home health aid for shower and bath for 2x a week for 3 weeks and  1x a week for 1 week

## 2019-02-21 NOTE — Telephone Encounter (Signed)
Please advise 

## 2019-02-21 NOTE — Telephone Encounter (Signed)
OK 

## 2019-02-21 NOTE — Telephone Encounter (Signed)
Called [name] and gave him verbal OK order from Dr. Elease Hashimoto. Betty Jackson verbalized an understanding.

## 2019-02-22 ENCOUNTER — Other Ambulatory Visit: Payer: Self-pay

## 2019-02-22 NOTE — Patient Outreach (Signed)
[place] [facility]) Care Management  02/22/2019  [name] December 15, 1934 [id number]   Medication Adherence call to [name] Compliant Voice message left with a call back number. [name] is showing past due on Rosuvastatin 10 mg under [place].   [place] Management Direct Dial [phone]  Fax [phone] [email or web address]

## 2019-02-27 DIAGNOSIS — M170 Bilateral primary osteoarthritis of knee: Secondary | ICD-10-CM | POA: Diagnosis not present

## 2019-02-27 DIAGNOSIS — K5730 Diverticulosis of large intestine without perforation or abscess without bleeding: Secondary | ICD-10-CM | POA: Diagnosis not present

## 2019-02-27 DIAGNOSIS — I2510 Atherosclerotic heart disease of native coronary artery without angina pectoris: Secondary | ICD-10-CM | POA: Diagnosis not present

## 2019-02-27 DIAGNOSIS — K5732 Diverticulitis of large intestine without perforation or abscess without bleeding: Secondary | ICD-10-CM | POA: Diagnosis not present

## 2019-02-27 DIAGNOSIS — I130 Hypertensive heart and chronic kidney disease with heart failure and stage 1 through stage 4 chronic kidney disease, or unspecified chronic kidney disease: Secondary | ICD-10-CM | POA: Diagnosis not present

## 2019-02-28 DIAGNOSIS — I130 Hypertensive heart and chronic kidney disease with heart failure and stage 1 through stage 4 chronic kidney disease, or unspecified chronic kidney disease: Secondary | ICD-10-CM | POA: Diagnosis not present

## 2019-02-28 DIAGNOSIS — I2510 Atherosclerotic heart disease of native coronary artery without angina pectoris: Secondary | ICD-10-CM | POA: Diagnosis not present

## 2019-02-28 DIAGNOSIS — M170 Bilateral primary osteoarthritis of knee: Secondary | ICD-10-CM | POA: Diagnosis not present

## 2019-02-28 DIAGNOSIS — K5732 Diverticulitis of large intestine without perforation or abscess without bleeding: Secondary | ICD-10-CM | POA: Diagnosis not present

## 2019-02-28 DIAGNOSIS — K5730 Diverticulosis of large intestine without perforation or abscess without bleeding: Secondary | ICD-10-CM | POA: Diagnosis not present

## 2019-03-01 NOTE — Progress Notes (Deleted)
Virtual Visit via Video Note The purpose of this virtual visit is to provide medical care while limiting exposure to the novel coronavirus.    Consent was obtained for video visit:  Yes.   Answered questions that patient had about telehealth interaction:  Yes.   I discussed the limitations, risks, security and privacy concerns of performing an evaluation and management service by telemedicine. I also discussed with the patient that there may be a patient responsible charge related to this service. The patient expressed understanding and agreed to proceed.  Pt location: Home Physician Location: office Name of referring provider:  Eulas Post, MD I connected with Lovina Reach Fero at patients initiation/request on 03/02/2019 at 12:50 PM EST by video enabled telemedicine application and verified that I am speaking with the correct person using two identifiers. Pt MRN:  [id number] Pt DOB:  [birth year] Video Participants:  Lovina Reach Traister   History of Present Illness:  Betty Jackson is an 84 year old female with paroxysmal atrial fibrillation, CAD, HTN, HLD, CKD, chronic low back pain and history of CVA who presents for dizziness.  History supplemented by ED and referring provider notes.  CT head from ED visit personally reviewed.  In September, she rolled over in bed to the right one morning and developed severe spinning sensation ***.  No prior history of vertigo.  ***.  No associated headache, visual disturbance, hearing loss, tinnitus, speech disturbance or unilateral numbness or weakness.  She tried the Epley maneuver, which was ineffective.  She does have a history of multiple falls.  She did have a fall on 02/06/2019 when she bent over to pick up a letter on the floor and just fell forward.  No associated vertigo.  She was evaluated in the ED on 11/14 where CT of head showed atrophy and chronic small vessel ischemic changes but no acute intracranial abnormality.  CT abdomen and pelvis did show  bilateral insufficiency fractures of the sacrum.  Labs were unremarkable.  Past Medical History: Past Medical History:  Diagnosis Date  . Abdominal pain, epigastric 06/22/2013  . Abdominal pain, other specified site 12/24/2011  . Abnormal urinalysis 05/20/2017  . Acute respiratory failure with hypoxia ([place]) 05/20/2017  . Anemia, unspecified 10/28/2012  . Anxiety state, unspecified 10/28/2012  . Bruises easily    d/t being on prednisone and plavix  . CAD (coronary artery disease)    a. Stent RCA in [facility];  b. Cath approx 2009 - nonobs per pt report.  . Cataract    immature on the left eye  . Chronic insomnia 02/07/2013  . Chronic lower back pain    scoliosis  . CKD (chronic kidney disease) stage 3, GFR 30-59 ml/min   . Complication of anesthesia    pt has a very high tolerance to meds  . Coronary disease 05/20/2017  . CVA (cerebral infarction) 10/29/2012  . DDD (degenerative disc disease)   . Depression   . Diverticulitis    hx of  . Diverticulosis   . Elevated transaminase level 02/07/2013  . Enteritis   . GERD (gastroesophageal reflux disease) 09/01/2010  . Giant cell arteritis ([place])   . Hemorrhoids   . Herniated nucleus pulposus, L5-S1, right 11/04/2015  . History of scabies   . HTN (hypertension) 05/20/2017  . Hyperlipidemia    takes Lipitor daily  . Hypertension    takes Amlodipine,Losartan,Metoprolol,and HCTZ daily  . Incontinence of urine   . Influenza A 05/20/2017  . Insomnia  takes Restoril nightly  . Lumbar stenosis 04/25/2013  . Major depressive disorder, recurrent episode, moderate ([place]) 07/16/2013  . Migraines    "back in my 20's; none since" (10/28/2012)  . Osteoporosis   . PAF (paroxysmal atrial fibrillation) ([place]) 2011   a. lone epidode in 2011 according to notes.  . Rheumatoid arthritis ([place])   . Scoliosis   . Sepsis ([place]) 05/20/2017  . Sinus bradycardia    a. on chronic bb  . Temporal arteritis ([place]) 2011   a. followed @ [facility]; potential flareup  10/28/2012/notes 10/28/2012  . Vocal cord dysfunction    "they don't operate properly" (10/28/2012)    Medications: Outpatient Encounter Medications as of 03/02/2019  Medication Sig  . amoxicillin (AMOXIL) 500 MG capsule TAKE 4 CAPSULES BY MOUTH 1 HOUR BEFORE DENTAL PROCEDURE  . cholecalciferol (VITAMIN D) 1000 UNITS tablet Take 1,000 Units by mouth daily.   Marland Kitchen dicyclomine (BENTYL) 20 MG tablet Take 1 tablet (20 mg total) by mouth 2 (two) times daily as needed for spasms.  Marland Kitchen diltiazem (CARDIZEM) 60 MG tablet Take 1 tablet (60 mg total) by mouth 3 (three) times daily.  Marland Kitchen ELIQUIS 5 MG TABS tablet TAKE 1 TABLET BY MOUTH TWICE A DAY (Patient taking differently: Take 5 mg by mouth 2 (two) times daily. )  . escitalopram (LEXAPRO) 10 MG tablet TAKE 1 TABLET BY MOUTH EVERY DAY (Patient taking differently: Take 10 mg by mouth daily. )  . furosemide (LASIX) 40 MG tablet Take 1 tablet (40mg ) by mouth twice a day and an extra 1/2 tablet (20mg ) twice a day as needed for edema.  Marland Kitchen HYDROcodone-acetaminophen (NORCO/VICODIN) 5-325 MG tablet Take one tablet by mouth every 8 hours as needed for pain. May refill in two months. (Patient taking differently: Take 1 tablet by mouth every 6 (six) hours as needed for moderate pain. )  . losartan (COZAAR) 25 MG tablet Take 1 tablet (25 mg total) by mouth daily.  . metoprolol tartrate (LOPRESSOR) 100 MG tablet Take 1 tablet (100 mg total) by mouth 2 (two) times daily.  . ondansetron (ZOFRAN ODT) 4 MG disintegrating tablet Take 1 tablet (4 mg total) by mouth every 8 (eight) hours as needed for nausea or vomiting.  . pantoprazole (PROTONIX) 40 MG tablet TAKE 1 TABLET BY MOUTH EVERY DAY  . potassium chloride SA (KLOR-CON) 20 MEQ tablet Take 1 tablet (20 mEq total) by mouth daily.  . predniSONE (DELTASONE) 10 MG tablet 4 tablets for 3 days, 3 tablets for 3 days, 2 tablets for 3 days, 1 tablet continue (Patient taking differently: Take 10 mg by mouth daily. )  . vitamin C (ASCORBIC  ACID) 500 MG tablet Take 1,000 mg by mouth daily as needed (for sore throat).   . vitamin E (VITAMIN E) 400 UNIT capsule Take 400 Units by mouth daily.  . [DISCONTINUED] pantoprazole (PROTONIX) 40 MG tablet Take 1 tablet (40 mg total) by mouth daily.   No facility-administered encounter medications on file as of 03/02/2019.     Allergies: Allergies  Allergen Reactions  . Dextromethorphan Rash    Family History: Family History  Problem Relation Age of Onset  . Brain cancer Mother 80  . Heart attack Father   . Breast cancer Daughter 98  . Heart disease Other   . Hypertension Other   . Arthritis Neg Hx   . Colon cancer Neg Hx   . Osteoporosis Neg Hx     Social History: Social History   Socioeconomic History  .  Marital status: Married    Spouse name: Not on file  . Number of children: 3  . Years of education: Not on file  . Highest education level: Not on file  Occupational History  . Occupation: Retired Development worker, community  . Financial resource strain: Not on file  . Food insecurity    Worry: Not on file    Inability: Not on file  . Transportation needs    Medical: Not on file    Non-medical: Not on file  Tobacco Use  . Smoking status: Never Smoker  . Smokeless tobacco: Never Used  Substance and Sexual Activity  . Alcohol use: Yes    Comment: socially  . Drug use: No  . Sexual activity: Not on file  Lifestyle  . Physical activity    Days per week: Not on file    Minutes per session: Not on file  . Stress: Not on file  Relationships  . Social Herbalist on phone: Not on file    Gets together: Not on file    Attends religious service: Not on file    Active member of club or organization: Not on file    Attends meetings of clubs or organizations: Not on file    Relationship status: Not on file  . Intimate partner violence    Fear of current or ex partner: Not on file    Emotionally abused: Not on file    Physically abused: Not on file     Forced sexual activity: Not on file  Other Topics Concern  . Not on file  Social History Narrative   Lives in [place] with her husband.  Retired Cabin crew.    Observations/Objective:   *** No acute distress.  Alert and oriented.  Speech fluent and not dysarthric.  Language intact.  Eyes orthophoric on primary gaze.  Face symmetric.  Assessment and Plan:   ***  Follow Up Instructions:    -I discussed the assessment and treatment plan with the patient. The patient was provided an opportunity to ask questions and all were answered. The patient agreed with the plan and demonstrated an understanding of the instructions.   The patient was advised to call back or seek an in-person evaluation if the symptoms worsen or if the condition fails to improve as anticipated.    Total Time spent in visit with the patient was:  ***, of which more than 50% of the time was spent in counseling and/or coordinating care on ***.   Pt understands and agrees with the plan of care outlined.     Dudley Major, DO

## 2019-03-02 ENCOUNTER — Ambulatory Visit: Payer: Medicare Other | Admitting: Neurology

## 2019-03-02 ENCOUNTER — Other Ambulatory Visit: Payer: Self-pay

## 2019-03-02 NOTE — Patient Outreach (Signed)
[place] [facility]) Care Management  03/02/2019  Betty Jackson 1934-12-03 [id number]   Medication Adherence call to Betty Jackson Compliant Voice message left with a call back number. Betty Jackson is showing past due on Rosuvastatin 10 mg under [place].   [place] Management Direct Dial [phone]  Fax [phone] [email or web address]

## 2019-03-03 DIAGNOSIS — I130 Hypertensive heart and chronic kidney disease with heart failure and stage 1 through stage 4 chronic kidney disease, or unspecified chronic kidney disease: Secondary | ICD-10-CM | POA: Diagnosis not present

## 2019-03-03 DIAGNOSIS — K5730 Diverticulosis of large intestine without perforation or abscess without bleeding: Secondary | ICD-10-CM | POA: Diagnosis not present

## 2019-03-03 DIAGNOSIS — M170 Bilateral primary osteoarthritis of knee: Secondary | ICD-10-CM | POA: Diagnosis not present

## 2019-03-03 DIAGNOSIS — I2510 Atherosclerotic heart disease of native coronary artery without angina pectoris: Secondary | ICD-10-CM | POA: Diagnosis not present

## 2019-03-03 DIAGNOSIS — K5732 Diverticulitis of large intestine without perforation or abscess without bleeding: Secondary | ICD-10-CM | POA: Diagnosis not present

## 2019-03-06 ENCOUNTER — Encounter (HOSPITAL_COMMUNITY): Payer: Self-pay

## 2019-03-06 ENCOUNTER — Emergency Department (HOSPITAL_COMMUNITY): Payer: Medicare Other

## 2019-03-06 ENCOUNTER — Other Ambulatory Visit: Payer: Self-pay

## 2019-03-06 ENCOUNTER — Inpatient Hospital Stay (HOSPITAL_COMMUNITY)
Admission: EM | Admit: 2019-03-06 | Discharge: 2019-03-13 | DRG: 853 | Disposition: A | Discharge status: Home Health | Payer: Medicare Other | Attending: Internal Medicine | Admitting: Internal Medicine

## 2019-03-06 DIAGNOSIS — I309 Acute pericarditis, unspecified: Secondary | ICD-10-CM | POA: Diagnosis not present

## 2019-03-06 DIAGNOSIS — I2510 Atherosclerotic heart disease of native coronary artery without angina pectoris: Secondary | ICD-10-CM | POA: Diagnosis present

## 2019-03-06 DIAGNOSIS — Z419 Encounter for procedure for purposes other than remedying health state, unspecified: Secondary | ICD-10-CM

## 2019-03-06 DIAGNOSIS — N183 Chronic kidney disease, stage 3 unspecified: Secondary | ICD-10-CM | POA: Diagnosis present

## 2019-03-06 DIAGNOSIS — Z79899 Other long term (current) drug therapy: Secondary | ICD-10-CM

## 2019-03-06 DIAGNOSIS — E785 Hyperlipidemia, unspecified: Secondary | ICD-10-CM | POA: Diagnosis not present

## 2019-03-06 DIAGNOSIS — T380X5A Adverse effect of glucocorticoids and synthetic analogues, initial encounter: Secondary | ICD-10-CM | POA: Diagnosis present

## 2019-03-06 DIAGNOSIS — I471 Supraventricular tachycardia: Secondary | ICD-10-CM | POA: Diagnosis not present

## 2019-03-06 DIAGNOSIS — I313 Pericardial effusion (noninflammatory): Secondary | ICD-10-CM

## 2019-03-06 DIAGNOSIS — D62 Acute posthemorrhagic anemia: Secondary | ICD-10-CM | POA: Diagnosis not present

## 2019-03-06 DIAGNOSIS — Z9181 History of falling: Secondary | ICD-10-CM

## 2019-03-06 DIAGNOSIS — E872 Acidosis: Secondary | ICD-10-CM | POA: Diagnosis not present

## 2019-03-06 DIAGNOSIS — Z7901 Long term (current) use of anticoagulants: Secondary | ICD-10-CM | POA: Diagnosis not present

## 2019-03-06 DIAGNOSIS — R509 Fever, unspecified: Secondary | ICD-10-CM | POA: Diagnosis not present

## 2019-03-06 DIAGNOSIS — R570 Cardiogenic shock: Secondary | ICD-10-CM | POA: Diagnosis present

## 2019-03-06 DIAGNOSIS — Z4682 Encounter for fitting and adjustment of non-vascular catheter: Secondary | ICD-10-CM | POA: Diagnosis not present

## 2019-03-06 DIAGNOSIS — I300 Acute nonspecific idiopathic pericarditis: Secondary | ICD-10-CM | POA: Diagnosis not present

## 2019-03-06 DIAGNOSIS — E2740 Unspecified adrenocortical insufficiency: Secondary | ICD-10-CM

## 2019-03-06 DIAGNOSIS — I319 Disease of pericardium, unspecified: Secondary | ICD-10-CM | POA: Diagnosis not present

## 2019-03-06 DIAGNOSIS — E43 Unspecified severe protein-calorie malnutrition: Secondary | ICD-10-CM | POA: Diagnosis not present

## 2019-03-06 DIAGNOSIS — I4891 Unspecified atrial fibrillation: Secondary | ICD-10-CM | POA: Diagnosis not present

## 2019-03-06 DIAGNOSIS — E272 Addisonian crisis: Secondary | ICD-10-CM | POA: Diagnosis present

## 2019-03-06 DIAGNOSIS — R0902 Hypoxemia: Secondary | ICD-10-CM | POA: Diagnosis not present

## 2019-03-06 DIAGNOSIS — R1310 Dysphagia, unspecified: Secondary | ICD-10-CM | POA: Diagnosis present

## 2019-03-06 DIAGNOSIS — Z7952 Long term (current) use of systemic steroids: Secondary | ICD-10-CM

## 2019-03-06 DIAGNOSIS — Z888 Allergy status to other drugs, medicaments and biological substances status: Secondary | ICD-10-CM

## 2019-03-06 DIAGNOSIS — F418 Other specified anxiety disorders: Secondary | ICD-10-CM | POA: Diagnosis present

## 2019-03-06 DIAGNOSIS — R918 Other nonspecific abnormal finding of lung field: Secondary | ICD-10-CM | POA: Diagnosis not present

## 2019-03-06 DIAGNOSIS — Z981 Arthrodesis status: Secondary | ICD-10-CM

## 2019-03-06 DIAGNOSIS — Z8249 Family history of ischemic heart disease and other diseases of the circulatory system: Secondary | ICD-10-CM

## 2019-03-06 DIAGNOSIS — I480 Paroxysmal atrial fibrillation: Secondary | ICD-10-CM | POA: Diagnosis present

## 2019-03-06 DIAGNOSIS — I301 Infective pericarditis: Secondary | ICD-10-CM | POA: Diagnosis not present

## 2019-03-06 DIAGNOSIS — R0789 Other chest pain: Secondary | ICD-10-CM | POA: Diagnosis not present

## 2019-03-06 DIAGNOSIS — D638 Anemia in other chronic diseases classified elsewhere: Secondary | ICD-10-CM | POA: Diagnosis present

## 2019-03-06 DIAGNOSIS — R52 Pain, unspecified: Secondary | ICD-10-CM | POA: Diagnosis not present

## 2019-03-06 DIAGNOSIS — E876 Hypokalemia: Secondary | ICD-10-CM | POA: Diagnosis present

## 2019-03-06 DIAGNOSIS — G8929 Other chronic pain: Secondary | ICD-10-CM | POA: Diagnosis present

## 2019-03-06 DIAGNOSIS — M810 Age-related osteoporosis without current pathological fracture: Secondary | ICD-10-CM | POA: Diagnosis present

## 2019-03-06 DIAGNOSIS — A419 Sepsis, unspecified organism: Principal | ICD-10-CM | POA: Diagnosis present

## 2019-03-06 DIAGNOSIS — Z20828 Contact with and (suspected) exposure to other viral communicable diseases: Secondary | ICD-10-CM | POA: Diagnosis present

## 2019-03-06 DIAGNOSIS — I314 Cardiac tamponade: Secondary | ICD-10-CM | POA: Diagnosis not present

## 2019-03-06 DIAGNOSIS — M419 Scoliosis, unspecified: Secondary | ICD-10-CM | POA: Diagnosis not present

## 2019-03-06 DIAGNOSIS — G4700 Insomnia, unspecified: Secondary | ICD-10-CM | POA: Diagnosis not present

## 2019-03-06 DIAGNOSIS — J90 Pleural effusion, not elsewhere classified: Secondary | ICD-10-CM | POA: Diagnosis present

## 2019-03-06 DIAGNOSIS — Z66 Do not resuscitate: Secondary | ICD-10-CM | POA: Diagnosis present

## 2019-03-06 DIAGNOSIS — R109 Unspecified abdominal pain: Secondary | ICD-10-CM | POA: Diagnosis not present

## 2019-03-06 DIAGNOSIS — Z90710 Acquired absence of both cervix and uterus: Secondary | ICD-10-CM

## 2019-03-06 DIAGNOSIS — F410 Panic disorder [episodic paroxysmal anxiety] without agoraphobia: Secondary | ICD-10-CM | POA: Diagnosis present

## 2019-03-06 DIAGNOSIS — N179 Acute kidney failure, unspecified: Secondary | ICD-10-CM | POA: Diagnosis not present

## 2019-03-06 DIAGNOSIS — M316 Other giant cell arteritis: Secondary | ICD-10-CM | POA: Diagnosis not present

## 2019-03-06 DIAGNOSIS — R6521 Severe sepsis with septic shock: Secondary | ICD-10-CM | POA: Diagnosis not present

## 2019-03-06 DIAGNOSIS — I491 Atrial premature depolarization: Secondary | ICD-10-CM | POA: Diagnosis not present

## 2019-03-06 DIAGNOSIS — R0602 Shortness of breath: Secondary | ICD-10-CM | POA: Diagnosis not present

## 2019-03-06 DIAGNOSIS — E860 Dehydration: Secondary | ICD-10-CM | POA: Diagnosis present

## 2019-03-06 DIAGNOSIS — K219 Gastro-esophageal reflux disease without esophagitis: Secondary | ICD-10-CM | POA: Diagnosis present

## 2019-03-06 DIAGNOSIS — Z9049 Acquired absence of other specified parts of digestive tract: Secondary | ICD-10-CM

## 2019-03-06 DIAGNOSIS — J383 Other diseases of vocal cords: Secondary | ICD-10-CM | POA: Diagnosis present

## 2019-03-06 DIAGNOSIS — Z743 Need for continuous supervision: Secondary | ICD-10-CM | POA: Diagnosis not present

## 2019-03-06 DIAGNOSIS — Z9689 Presence of other specified functional implants: Secondary | ICD-10-CM

## 2019-03-06 DIAGNOSIS — Z955 Presence of coronary angioplasty implant and graft: Secondary | ICD-10-CM

## 2019-03-06 DIAGNOSIS — R000 Tachycardia, unspecified: Secondary | ICD-10-CM | POA: Diagnosis not present

## 2019-03-06 DIAGNOSIS — J939 Pneumothorax, unspecified: Secondary | ICD-10-CM

## 2019-03-06 DIAGNOSIS — I129 Hypertensive chronic kidney disease with stage 1 through stage 4 chronic kidney disease, or unspecified chronic kidney disease: Secondary | ICD-10-CM | POA: Diagnosis present

## 2019-03-06 DIAGNOSIS — M069 Rheumatoid arthritis, unspecified: Secondary | ICD-10-CM | POA: Diagnosis present

## 2019-03-06 DIAGNOSIS — R5081 Fever presenting with conditions classified elsewhere: Secondary | ICD-10-CM | POA: Diagnosis not present

## 2019-03-06 DIAGNOSIS — Z8673 Personal history of transient ischemic attack (TIA), and cerebral infarction without residual deficits: Secondary | ICD-10-CM

## 2019-03-06 DIAGNOSIS — R739 Hyperglycemia, unspecified: Secondary | ICD-10-CM | POA: Diagnosis present

## 2019-03-06 DIAGNOSIS — R079 Chest pain, unspecified: Secondary | ICD-10-CM | POA: Diagnosis not present

## 2019-03-06 DIAGNOSIS — I318 Other specified diseases of pericardium: Secondary | ICD-10-CM | POA: Diagnosis not present

## 2019-03-06 LAB — COMPREHENSIVE METABOLIC PANEL
ALT: 47 U/L — ABNORMAL HIGH (ref 0–44)
AST: 44 U/L — ABNORMAL HIGH (ref 15–41)
Albumin: 3.3 g/dL — ABNORMAL LOW (ref 3.5–5.0)
Alkaline Phosphatase: 114 U/L (ref 38–126)
Anion gap: 15 (ref 5–15)
BUN: 17 mg/dL (ref 8–23)
CO2: 24 mmol/L (ref 22–32)
Calcium: 9 mg/dL (ref 8.9–10.3)
Chloride: 101 mmol/L (ref 98–111)
Creatinine, Ser: 0.96 mg/dL (ref 0.44–1.00)
GFR calc Af Amer: >60 mL/min (ref >60)
GFR calc non Af Amer: 55 mL/min — ABNORMAL LOW (ref >60)
Glucose, Bld: 153 mg/dL — ABNORMAL HIGH (ref 70–99)
Potassium: 3.9 mmol/L (ref 3.5–5.1)
Sodium: 140 mmol/L (ref 135–145)
Total Bilirubin: 1.5 mg/dL — ABNORMAL HIGH (ref 0.3–1.2)
Total Protein: 6.5 g/dL (ref 6.5–8.1)

## 2019-03-06 LAB — BASIC METABOLIC PANEL
Anion gap: 13 (ref 5–15)
BUN: 18 mg/dL (ref 8–23)
CO2: 22 mmol/L (ref 22–32)
Calcium: 8.2 mg/dL — ABNORMAL LOW (ref 8.9–10.3)
Chloride: 106 mmol/L (ref 98–111)
Creatinine, Ser: 1.57 mg/dL — ABNORMAL HIGH (ref 0.44–1.00)
GFR calc Af Amer: 35 mL/min — ABNORMAL LOW (ref >60)
GFR calc non Af Amer: 30 mL/min — ABNORMAL LOW (ref >60)
Glucose, Bld: 130 mg/dL — ABNORMAL HIGH (ref 70–99)
Potassium: 4.2 mmol/L (ref 3.5–5.1)
Sodium: 141 mmol/L (ref 135–145)

## 2019-03-06 LAB — URINALYSIS, ROUTINE W REFLEX MICROSCOPIC
Bacteria, UA: NONE SEEN
Bilirubin Urine: NEGATIVE
Glucose, UA: NEGATIVE mg/dL
Hgb urine dipstick: NEGATIVE
Ketones, ur: 5 mg/dL — AB
Leukocytes,Ua: NEGATIVE
Nitrite: NEGATIVE
Protein, ur: 100 mg/dL — AB
Specific Gravity, Urine: >1.046 — ABNORMAL HIGH (ref 1.005–1.030)
pH: 5 (ref 5.0–8.0)

## 2019-03-06 LAB — CBC WITH DIFFERENTIAL/PLATELET
Abs Immature Granulocytes: 0.1 10*3/uL — ABNORMAL HIGH (ref 0.00–0.07)
Basophils Absolute: 0.1 10*3/uL (ref 0.0–0.1)
Basophils Relative: 0 %
Eosinophils Absolute: 0 10*3/uL (ref 0.0–0.5)
Eosinophils Relative: 0 %
HCT: 41.7 % (ref 36.0–46.0)
Hemoglobin: 13 g/dL (ref 12.0–15.0)
Immature Granulocytes: 1 %
Lymphocytes Relative: 18 %
Lymphs Abs: 3.3 10*3/uL (ref 0.7–4.0)
MCH: 29.3 pg (ref 26.0–34.0)
MCHC: 31.2 g/dL (ref 30.0–36.0)
MCV: 93.9 fL (ref 80.0–100.0)
Monocytes Absolute: 1.9 10*3/uL — ABNORMAL HIGH (ref 0.1–1.0)
Monocytes Relative: 10 %
Neutro Abs: 13.1 10*3/uL — ABNORMAL HIGH (ref 1.7–7.7)
Neutrophils Relative %: 71 %
Platelets: 224 10*3/uL (ref 150–400)
RBC: 4.44 MIL/uL (ref 3.87–5.11)
RDW: 14.6 % (ref 11.5–15.5)
WBC: 18.4 10*3/uL — ABNORMAL HIGH (ref 4.0–10.5)
nRBC: 0 % (ref 0.0–0.2)

## 2019-03-06 LAB — MAGNESIUM: Magnesium: 1.6 mg/dL — ABNORMAL LOW (ref 1.7–2.4)

## 2019-03-06 LAB — SARS CORONAVIRUS 2 (TAT 6-24 HRS): SARS Coronavirus 2: NEGATIVE

## 2019-03-06 LAB — MRSA PCR SCREENING: MRSA by PCR: NEGATIVE

## 2019-03-06 LAB — LACTIC ACID, PLASMA
Lactic Acid, Venous: 2.3 mmol/L (ref 0.5–1.9)
Lactic Acid, Venous: 3.8 mmol/L (ref 0.5–1.9)
Lactic Acid, Venous: 3 mmol/L (ref 0.5–1.9)

## 2019-03-06 LAB — C-REACTIVE PROTEIN: CRP: 16.7 mg/dL — ABNORMAL HIGH (ref <1.0)

## 2019-03-06 LAB — SEDIMENTATION RATE: Sed Rate: 26 mm/hr — ABNORMAL HIGH (ref 0–22)

## 2019-03-06 LAB — PROTIME-INR
INR: 1.3 — ABNORMAL HIGH (ref 0.8–1.2)
Prothrombin Time: 16 seconds — ABNORMAL HIGH (ref 11.4–15.2)

## 2019-03-06 LAB — HIV ANTIBODY (ROUTINE TESTING W REFLEX): HIV Screen 4th Generation wRfx: NONREACTIVE

## 2019-03-06 LAB — RESPIRATORY PANEL BY RT PCR (FLU A&B, COVID)
Influenza A by PCR: NEGATIVE
Influenza B by PCR: NEGATIVE
SARS Coronavirus 2 by RT PCR: NEGATIVE

## 2019-03-06 LAB — GLUCOSE, CAPILLARY
Glucose-Capillary: 104 mg/dL — ABNORMAL HIGH (ref 70–99)
Glucose-Capillary: 136 mg/dL — ABNORMAL HIGH (ref 70–99)

## 2019-03-06 LAB — TSH: TSH: 3.085 u[IU]/mL (ref 0.350–4.500)

## 2019-03-06 LAB — POC SARS CORONAVIRUS 2 AG -  ED: SARS Coronavirus 2 Ag: NEGATIVE

## 2019-03-06 LAB — APTT: aPTT: 30 seconds (ref 24–36)

## 2019-03-06 LAB — ECHOCARDIOGRAM LIMITED
Height: 64.5 in
Weight: 2560 oz

## 2019-03-06 LAB — LIPASE, BLOOD: Lipase: 26 U/L (ref 11–51)

## 2019-03-06 LAB — PROCALCITONIN: Procalcitonin: 18.12 ng/mL

## 2019-03-06 LAB — TROPONIN I (HIGH SENSITIVITY)
Troponin I (High Sensitivity): 29 ng/L — ABNORMAL HIGH (ref <18)
Troponin I (High Sensitivity): 123 ng/L (ref <18)

## 2019-03-06 MED ORDER — ACETAMINOPHEN 650 MG RE SUPP
650.0000 mg | Freq: Once | RECTAL | Status: AC
  Administered 2019-03-06: 650 mg via RECTAL
  Filled 2019-03-06: qty 1

## 2019-03-06 MED ORDER — PANTOPRAZOLE SODIUM 40 MG IV SOLR
40.0000 mg | INTRAVENOUS | Status: DC
  Administered 2019-03-06: 40 mg via INTRAVENOUS
  Filled 2019-03-06: qty 40

## 2019-03-06 MED ORDER — MAGNESIUM SULFATE 2 GM/50ML IV SOLN
2.0000 g | Freq: Once | INTRAVENOUS | Status: AC
  Administered 2019-03-06: 2 g via INTRAVENOUS
  Filled 2019-03-06: qty 50

## 2019-03-06 MED ORDER — HYDROCORTISONE NA SUCCINATE PF 100 MG IJ SOLR
50.0000 mg | Freq: Four times a day (QID) | INTRAMUSCULAR | Status: DC
  Administered 2019-03-06 – 2019-03-08 (×8): 50 mg via INTRAVENOUS
  Filled 2019-03-06 (×8): qty 2

## 2019-03-06 MED ORDER — INSULIN ASPART 100 UNIT/ML ~~LOC~~ SOLN: 0.0000 [IU] | SUBCUTANEOUS | Status: DC

## 2019-03-06 MED ORDER — LIDOCAINE VISCOUS HCL 2 % MT SOLN
15.0000 mL | Freq: Once | OROMUCOSAL | Status: DC
  Filled 2019-03-06: qty 15

## 2019-03-06 MED ORDER — PIPERACILLIN-TAZOBACTAM 3.375 G IVPB
3.3750 g | Freq: Three times a day (TID) | INTRAVENOUS | Status: DC
  Administered 2019-03-06 – 2019-03-10 (×11): 3.375 g via INTRAVENOUS
  Filled 2019-03-06 (×11): qty 50

## 2019-03-06 MED ORDER — PANTOPRAZOLE SODIUM 40 MG PO TBEC
40.0000 mg | DELAYED_RELEASE_TABLET | Freq: Once | ORAL | Status: DC
  Filled 2019-03-06: qty 1

## 2019-03-06 MED ORDER — LACTATED RINGERS IV BOLUS (SEPSIS)
1000.0000 mL | Freq: Once | INTRAVENOUS | Status: AC
  Administered 2019-03-06: 1000 mL via INTRAVENOUS

## 2019-03-06 MED ORDER — METRONIDAZOLE IN NACL 5-0.79 MG/ML-% IV SOLN
500.0000 mg | Freq: Once | INTRAVENOUS | Status: AC
  Administered 2019-03-06: 500 mg via INTRAVENOUS
  Filled 2019-03-06: qty 100

## 2019-03-06 MED ORDER — SODIUM CHLORIDE (PF) 0.9 % IJ SOLN
INTRAMUSCULAR | Status: AC
  Filled 2019-03-06: qty 50

## 2019-03-06 MED ORDER — ENOXAPARIN SODIUM 40 MG/0.4ML ~~LOC~~ SOLN: 40.0000 mg | SUBCUTANEOUS | Status: DC

## 2019-03-06 MED ORDER — MORPHINE SULFATE (PF) 4 MG/ML IV SOLN
4.0000 mg | Freq: Once | INTRAVENOUS | Status: AC
  Administered 2019-03-06: 4 mg via INTRAVENOUS
  Filled 2019-03-06: qty 1

## 2019-03-06 MED ORDER — VANCOMYCIN HCL IN DEXTROSE 750-5 MG/150ML-% IV SOLN
750.0000 mg | INTRAVENOUS | Status: DC
  Filled 2019-03-06: qty 150

## 2019-03-06 MED ORDER — LACTATED RINGERS IV BOLUS (SEPSIS)
250.0000 mL | Freq: Once | INTRAVENOUS | Status: AC
  Administered 2019-03-06: 250 mL via INTRAVENOUS

## 2019-03-06 MED ORDER — IOHEXOL 300 MG/ML  SOLN
100.0000 mL | Freq: Once | INTRAMUSCULAR | Status: AC | PRN
  Administered 2019-03-06: 100 mL via INTRAVENOUS

## 2019-03-06 MED ORDER — ALUM & MAG HYDROXIDE-SIMETH 200-200-20 MG/5ML PO SUSP
30.0000 mL | Freq: Once | ORAL | Status: DC
  Filled 2019-03-06: qty 30

## 2019-03-06 MED ORDER — DILTIAZEM HCL-DEXTROSE 125-5 MG/125ML-% IV SOLN (PREMIX)
5.0000 mg/h | INTRAVENOUS | Status: DC
  Administered 2019-03-06: 5 mg/h via INTRAVENOUS
  Filled 2019-03-06 (×2): qty 125

## 2019-03-06 MED ORDER — SODIUM CHLORIDE 0.9 % IV SOLN
2.0000 g | Freq: Once | INTRAVENOUS | Status: AC
  Administered 2019-03-06: 2 g via INTRAVENOUS
  Filled 2019-03-06: qty 2

## 2019-03-06 MED ORDER — VANCOMYCIN HCL IN DEXTROSE 750-5 MG/150ML-% IV SOLN
750.0000 mg | INTRAVENOUS | Status: DC
  Administered 2019-03-07 – 2019-03-08 (×2): 750 mg via INTRAVENOUS
  Filled 2019-03-06 (×4): qty 150

## 2019-03-06 MED ORDER — ACETAMINOPHEN 500 MG PO TABS: 1000.0000 mg | ORAL_TABLET | Freq: Once | ORAL | Status: DC

## 2019-03-06 MED ORDER — CHLORHEXIDINE GLUCONATE CLOTH 2 % EX PADS
6.0000 | MEDICATED_PAD | Freq: Every day | CUTANEOUS | Status: DC
  Administered 2019-03-06 – 2019-03-07 (×2): 6 via TOPICAL

## 2019-03-06 MED ORDER — ONDANSETRON HCL 4 MG/2ML IJ SOLN
4.0000 mg | Freq: Four times a day (QID) | INTRAMUSCULAR | Status: DC | PRN
  Administered 2019-03-06: 4 mg via INTRAVENOUS
  Filled 2019-03-06: qty 2

## 2019-03-06 MED ORDER — HYDROCODONE-ACETAMINOPHEN 5-325 MG PO TABS
1.0000 | ORAL_TABLET | Freq: Three times a day (TID) | ORAL | Status: DC | PRN
  Administered 2019-03-06 – 2019-03-12 (×16): 1 via ORAL
  Filled 2019-03-06 (×19): qty 1

## 2019-03-06 MED ORDER — SODIUM CHLORIDE 0.9 % IV BOLUS: 1000.0000 mL | Freq: Once | INTRAVENOUS | Status: DC

## 2019-03-06 MED ORDER — LACTATED RINGERS IV SOLN
INTRAVENOUS | Status: DC
  Administered 2019-03-06: 19:00:00 via INTRAVENOUS

## 2019-03-06 MED ORDER — ENOXAPARIN SODIUM 80 MG/0.8ML ~~LOC~~ SOLN
70.0000 mg | Freq: Two times a day (BID) | SUBCUTANEOUS | Status: DC
  Administered 2019-03-06 – 2019-03-07 (×2): 70 mg via SUBCUTANEOUS
  Filled 2019-03-06 (×4): qty 0.7

## 2019-03-06 MED ORDER — NOREPINEPHRINE 4 MG/250ML-% IV SOLN
0.0000 ug/min | INTRAVENOUS | Status: DC
  Administered 2019-03-06: 2 ug/min via INTRAVENOUS
  Filled 2019-03-06 (×3): qty 250

## 2019-03-06 MED ORDER — STERILE WATER FOR INJECTION IJ SOLN
INTRAMUSCULAR | Status: AC
  Filled 2019-03-06: qty 10

## 2019-03-06 MED ORDER — VANCOMYCIN HCL IN DEXTROSE 1-5 GM/200ML-% IV SOLN: 1000.0000 mg | Freq: Once | INTRAVENOUS | Status: DC

## 2019-03-06 MED ORDER — ONDANSETRON HCL 4 MG/2ML IJ SOLN
4.0000 mg | Freq: Once | INTRAMUSCULAR | Status: DC
  Filled 2019-03-06: qty 2

## 2019-03-06 MED ORDER — DICYCLOMINE HCL 20 MG PO TABS
20.0000 mg | ORAL_TABLET | Freq: Two times a day (BID) | ORAL | Status: DC | PRN
  Filled 2019-03-06: qty 1

## 2019-03-06 MED ORDER — ACETAMINOPHEN 10 MG/ML IV SOLN
1000.0000 mg | Freq: Once | INTRAVENOUS | Status: AC
  Administered 2019-03-06: 1000 mg via INTRAVENOUS
  Filled 2019-03-06: qty 100

## 2019-03-06 MED ORDER — VANCOMYCIN HCL 10 G IV SOLR
1500.0000 mg | Freq: Once | INTRAVENOUS | Status: AC
  Administered 2019-03-06: 1500 mg via INTRAVENOUS
  Filled 2019-03-06: qty 1500

## 2019-03-06 MED ORDER — FENTANYL CITRATE (PF) 100 MCG/2ML IJ SOLN
50.0000 ug | Freq: Once | INTRAMUSCULAR | Status: AC
  Administered 2019-03-06: 50 ug via INTRAVENOUS
  Filled 2019-03-06: qty 2

## 2019-03-06 NOTE — ED Notes (Signed)
Patient on 4L face mask.

## 2019-03-06 NOTE — Progress Notes (Signed)
Attempted to call the ED RN again and was unsuccessful. At this time orders have not changed still remains the same so a secure chat will be sent to the RN, stating if IV team is still needed to please re enter consult after new medication orders are placed.

## 2019-03-06 NOTE — ED Notes (Signed)
Pure wick applied by RN.

## 2019-03-06 NOTE — Progress Notes (Signed)

## 2019-03-06 NOTE — Progress Notes (Signed)
Notified bedside nurse of need to draw repeat lactic acid.  Called beside to ensure repeat lactic would be drawn, currently patient just had a central line placed and is hanging a Levo for BP support and will draw the lactic.

## 2019-03-06 NOTE — Progress Notes (Signed)
Attempted to call the ED concerning the need for a second IV, at this time the patient has one working PIV and all meds have been discontinued or put on hold. Was unable to reach the ED Nurse at this time.

## 2019-03-06 NOTE — ED Notes (Signed)
Patient being transported to CT at this time 

## 2019-03-06 NOTE — ED Notes (Signed)
Immediately after administration of rectal tylenol, pt had large BM.  EDP aware.  Will readminister new dose of rectal tylenol.

## 2019-03-06 NOTE — Progress Notes (Signed)
eLink Physician-Brief Progress Note Patient Name: Betty Jackson DOB: 13-Jan-1935 MRN: [id number]   Date of Service  03/06/2019  HPI/Events of Note  Hypomagnesemia - Mg++ = 1.6 and Creatinine = 1.57.  eICU Interventions  Will order: 1. Replace Mg++.     Intervention Category Major Interventions: Electrolyte abnormality - evaluation and management  Cesilia Shinn Eugene 03/06/2019, 11:01 PM

## 2019-03-06 NOTE — Progress Notes (Signed)
eLink Physician-Brief Progress Note Patient Name: Betty Jackson DOB: 1934-04-05 MRN: [id number]   Date of Service  03/06/2019  HPI/Events of Note  SVT - Looks like AFIB with RVR on monitor. HR = 160. BP = 119/76 with MAP = 91.   eICU Interventions  Will order: 1. BMP and Mg++ level STAT. 2. 12 Leak EKG STAT.  3. Cardizem IV infusion. Titrate to HR = 65-105.      Intervention Category Major Interventions: Arrhythmia - evaluation and management  Sommer,Steven Cornelia Copa 03/06/2019, 9:37 PM

## 2019-03-06 NOTE — H&P (Signed)
_   NAME:  Betty Jackson, MRN:  [id number], DOB:  [birth year], LOS: 0 ADMISSION DATE:  03/06/2019, CONSULTATION DATE:  03/06/19 REFERRING MD:  Wilson Singer  CHIEF COMPLAINT:  Chest discomfort   Brief History   PHALYN WAS is a 84 y.o. female who was admitted 12/7 with undifferentiated shock, presumed septic of unclear etiology +/- cardiogenic.  Has small pericardial effusion and known epicardial fat pad.  Evaluated by TCTS who recommends repeat echo in 24 hours.  History of present illness    ERNELL Jackson is a 84 y.o. female who has a PMH as outlined below  She presented to [facility] ED 12/7 with pain in mid sternal / chest area and radiating into epigastrium x 3 days.  Felt as if she couldn't swallow.  Associated with nausea and vomiting and feels pain somewhat improved with vomiting but not fully.  She was initially treated with GI cocktail.  Was also found to have borderline hypotension so was started on fluids.  CT A / P was read as pericarditis with mod to large pericardial effusion.  Incidentally also found to have subacute left ischial tuberosity avulsion fx with stable subacute to chronic right sacral ala insufficiency fx.   Given hypotension, there was concern for tamponade physiology; therefore, STAT echo was obtained.  This confirmed effusion; however, also showed fat pad which was seen on prior imaging.  EF 70-75% with rapid ventricular rate.   She was evaluated urgently by CVTS; however, was felt to NOT have significant pericardial effusion and no tamponade.  Rather, CVTS notes slight effusion in addition to the known fat pad.  They are recommending repeat echo in 24 hours.  Unfortunately, she had significant hypotension in ED; therefore, had CVL placed and was started on norepinephrine.  At the time of CVTS eval, she was febrile to 103 and was on 7mcg/min of NE.  Due to above, she was transferred to [facility] cardiac ICU.  Of note, she had recent admission 01/13/19 through 01/20/19.  She was discharged  on cipro + flagyl to complete full abx course.  Past Medical History  has Temporal arteritis ([place]); Hypertension; Hyperlipidemia; History of atrial fibrillation; CAD (coronary artery disease); GERD (gastroesophageal reflux disease); Insomnia; Leg pain, right; Enteritis; Abdominal pain, other specified site; Hyponatremia; Hematuria; Nonspecific (abnormal) findings on radiological and other examination of gastrointestinal tract; Visual changes; Depression; Anxiety state, unspecified; Anemia, unspecified; CVA (cerebral infarction); Chronic insomnia; Elevated transaminase level; Lumbar stenosis; Abdominal pain, epigastric; Major depressive disorder, recurrent episode, moderate ([place]); Herniated nucleus pulposus, L5-S1, right; Osteoporosis; Influenza A; Sepsis ([place]); Acute respiratory failure with hypoxia (HCC); CKD (chronic kidney disease) stage 3, GFR 30-59 ml/min; PAF (paroxysmal atrial fibrillation) ([place]); HTN (hypertension); Giant cell arteritis ([place]); Coronary disease; Abnormal urinalysis; Chronic low back pain; Acute diverticulitis; AKI (acute kidney injury) ([place]); Chronic pain; Tachycardia; Hypokalemia; Nausea; Atrial fibrillation with rapid ventricular response ([place]); and Pericarditis on their problem list.  Significant Hospital Events   12/7 > admit.  Consults:  TCTS.  Procedures:  None.  Significant Diagnostic Tests:  CT A / P 12/7 > ? Pericarditis with mod to large pericardial effusion.  Subacute left ischial tuberosity avulsion fx.  Stable subacute to chronic right sacral ala insufficiency fx.  Chronic biliary dilatation post cholecystectomy. Echo 12/7 > EF 70-75%, mod pericardial effusion, presence of pericardial fat pad.  Micro Data:  Blood 12/7 > neg. Sputum 12/7 >  Urine 12/7 >  SARS CoV2 12/7 > neg.  Antimicrobials:  Cefepime12/7 > Zosyn  12/7 >    Interim history/subjective:  Has back pain (chronic).  Currently on 56mcg/min levophed with MAP 68.  Objective:  Blood pressure  110/79, pulse (!) 159, temperature (!) 103.3 F (39.6 C), temperature source Rectal, resp. rate (!) 25, height 5' 4.5" (1.638 m), weight 72.6 kg, SpO2 95 %.       No intake or output data in the 24 hours ending 03/06/19 1342 Filed Weights   03/06/19 0550  Weight: 72.6 kg    Examination: General: Adult female, resting in bed, in NAD. Neuro: A&O x 3, no deficits. HEENT: [place]/AT. Sclerae anicteric. EOMI.  MM moist. Cardiovascular: IRIR, no M/R/G.  Lungs: Respirations even and unlabored.  Diminished bilaterally. Abdomen: BS x 4, soft, NT/ND.  Musculoskeletal: No gross deformities, no edema.  Skin: Intact, warm, no rashes.   Assessment & Plan:   Shock - unclear etiology at this point.  Presumed septic + / - cardiogenic.  Also consider adrenal insufficiency / crisis (on chronic pred). - Continue levophed as needed for goal MAP > 65. - Empiric stress steroids. - Empiric cefepime / zosyn for now. - Follow cultures.  Pericardial effusion - read as mod - large on imaging but reviewed by TCTS who feels it is small and NOT concerning for tamponade. - Assess coxsackie, ANA, antistreptolysin O, quant gold, RF. - Repeat echo 12/8.  Hx PAF, HTN, HLD,  - Lovenox in lieu of home eliquis. - Hold home diltiazem, eliquis, furosemide, losartan, lopressor, rosuvastatin.  Hx temporal arteritis (on chronic prednisone 10mg  daily). - Stress steroids as above.  Hx diverticulitis (recent admission 01/13/19 through 01/20/19, treated with cipro / flagyl).  CT this admit without suggestion of recurrence. - Monitor.  Hx DDD, CVA, depression. - Hold home escitalopram, norco.   Best Practice:  Diet: NPO. Pain/Anxiety/Delirium protocol (if indicated): N/A. VAP protocol (if indicated): In place. N/A. DVT prophylaxis: SCD's / Lovenox. GI prophylaxis: None. Glucose control: SSI. Mobility: Bedrest. Code Status: DNR. Family Communication: Daughter updated at bedside. Disposition: ICU.  Labs    CBC: Recent Labs  Lab 03/06/19 0625  WBC 18.4*  NEUTROABS 13.1*  HGB 13.0  HCT 41.7  MCV 93.9  PLT [id number]   Basic Metabolic Panel: Recent Labs  Lab 03/06/19 0625  NA 140  K 3.9  CL 101  CO2 24  GLUCOSE 153*  BUN 17  CREATININE 0.96  CALCIUM 9.0   GFR: Estimated Creatinine Clearance: 44.6 mL/min (by C-G formula based on SCr of 0.96 mg/dL). Recent Labs  Lab 03/06/19 0625 03/06/19 0626 03/06/19 0826  WBC 18.4*  --   --   LATICACIDVEN  --  2.3* 3.8*   Liver Function Tests: Recent Labs  Lab 03/06/19 0625  AST 44*  ALT 47*  ALKPHOS 114  BILITOT 1.5*  PROT 6.5  ALBUMIN 3.3*   Recent Labs  Lab 03/06/19 0625  LIPASE 26   No results for input(s): AMMONIA in the last 168 hours. ABG    Component Value Date/Time   PHART 7.441 12/09/2012 1026   PCO2ART 34.0 (L) 12/09/2012 1026   PO2ART 96.0 12/09/2012 1026   HCO3 23.3 12/09/2012 1032   TCO2 25 12/09/2012 1032   ACIDBASEDEF 2.0 12/09/2012 1032   O2SAT 69.0 12/09/2012 1032    Coagulation Profile: Recent Labs  Lab 03/06/19 0626  INR 1.3*   Cardiac Enzymes: No results for input(s): CKTOTAL, CKMB, CKMBINDEX, TROPONINI in the last 168 hours. HbA1C: Hgb A1c MFr Bld  Date/Time Value Ref Range Status  03/31/2017 08:30  AM 5.8 4.6 - 6.5 % Final    Comment:    Glycemic Control Guidelines for People with Diabetes:Non Diabetic:  <6%Goal of Therapy: <7%Additional Action Suggested:  >8%   11/24/2012 11:17 AM 5.8 4.6 - 6.5 % Final    Comment:    Glycemic Control Guidelines for People with Diabetes:Non Diabetic:  <6%Goal of Therapy: <7%Additional Action Suggested:  >8%    CBG: No results for input(s): GLUCAP in the last 168 hours.  Review of Systems:   All negative; except for those that are bolded, which indicate positives.  Constitutional: weight loss, weight gain, night sweats, fevers, chills, fatigue, weakness.  HEENT: headaches, sore throat, sneezing, nasal congestion, post nasal drip, difficulty swallowing,  tooth/dental problems, visual complaints, visual changes, ear aches. Neuro: difficulty with speech, weakness, numbness, ataxia. CV:  chest pain, orthopnea, PND, swelling in lower extremities, dizziness, palpitations, syncope.  Resp: cough, hemoptysis, dyspnea, wheezing. GI: heartburn, indigestion, abdominal pain, nausea, vomiting, diarrhea, constipation, change in bowel habits, loss of appetite, hematemesis, melena, hematochezia.  GU: dysuria, change in color of urine, urgency or frequency, flank pain, hematuria. MSK: joint pain or swelling, decreased range of motion. Psych: change in mood or affect, depression, anxiety, suicidal ideations, homicidal ideations. Skin: rash, itching, bruising.   Past medical history  She,  has a past medical history of Abdominal pain, epigastric (06/22/2013), Abdominal pain, other specified site (12/24/2011), Abnormal urinalysis (05/20/2017), Acute respiratory failure with hypoxia ([place]) (05/20/2017), Anemia, unspecified (10/28/2012), Anxiety state, unspecified (10/28/2012), Bruises easily, CAD (coronary artery disease), Cataract, Chronic insomnia (02/07/2013), Chronic lower back pain, CKD (chronic kidney disease) stage 3, GFR 0000000 ml/min, Complication of anesthesia, Coronary disease (05/20/2017), CVA (cerebral infarction) (10/29/2012), DDD (degenerative disc disease), Depression, Diverticulitis, Diverticulosis, Elevated transaminase level (02/07/2013), Enteritis, GERD (gastroesophageal reflux disease) (09/01/2010), Giant cell arteritis ([place]), Hemorrhoids, Herniated nucleus pulposus, L5-S1, right (11/04/2015), History of scabies, HTN (hypertension) (05/20/2017), Hyperlipidemia, Hypertension, Incontinence of urine, Influenza A (05/20/2017), Insomnia, Lumbar stenosis (04/25/2013), Major depressive disorder, recurrent episode, moderate ([place]) (07/16/2013), Migraines, Osteoporosis, PAF (paroxysmal atrial fibrillation) ([place]) (2011), Rheumatoid arthritis ([place]), Scoliosis, Sepsis ([employer]) (05/20/2017),  Sinus bradycardia, Temporal arteritis ([place]) (2011), and Vocal cord dysfunction.   Surgical History    Past Surgical History:  Procedure Laterality Date   ABDOMINAL HYSTERECTOMY  ~ 1984   vaginally   BACK SURGERY  7-56yrs ago   X Stop   BLADDER SUSPENSION  2001   BREAST BIOPSY Right    CATARACT EXTRACTION W/ INTRAOCULAR LENS IMPLANT Right ~ 08/2012   COLONOSCOPY  01/26/2012   Procedure: COLONOSCOPY;  Surgeon: Ladene Artist, MD,FACG;  Location: [facility] ENDOSCOPY;  Service: Endoscopy;  Laterality: N/A;  note the EGD is possible   CORONARY ANGIOPLASTY WITH STENT PLACEMENT  2006   X 1 stent   EPIDURAL BLOCK INJECTION     ESOPHAGOGASTRODUODENOSCOPY  01/26/2012   Procedure: ESOPHAGOGASTRODUODENOSCOPY (EGD);  Surgeon: Ladene Artist, MD,FACG;  Location: [facility] ENDOSCOPY;  Service: Endoscopy;  Laterality: N/A;   HEMIARTHROPLASTY HIP Right 2012   LAPAROSCOPIC CHOLECYSTECTOMY  2001   LUMBAR FUSION  03/2013   LUMBAR LAMINECTOMY/DECOMPRESSION MICRODISCECTOMY Right 11/04/2015   Procedure: Right Lumbar Five-Sacral One Microdiskectomy;  Surgeon: Kristeen Miss, MD;  Location: [facility] NEURO ORS;  Service: Neurosurgery;  Laterality: Right;  Right L5-S1 Microdiskectomy   moles removed that required stiches     one on leg and one on face   TEMPORAL ARTERY BIOPSY / LIGATION Bilateral 2011   TONSILLECTOMY AND ADENOIDECTOMY     at age 56   TUBAL  LIGATION  ~ 1982   X-STOP IMPLANTATION  ~ 2010   "lower back" (10/28/2012)     Social History   reports that she has never smoked. She has never used smokeless tobacco. She reports current alcohol use. She reports that she does not use drugs.   Family history   Her family history includes Brain cancer (age of onset: 67) in her mother; Breast cancer (age of onset: 64) in her daughter; Heart attack in her father; Heart disease in an other family member; Hypertension in an other family member. There is no history of Arthritis, Colon cancer, or Osteoporosis.    Allergies Allergies  Allergen Reactions   Dextromethorphan Rash     Home meds  Prior to Admission medications   Medication Sig Start Date End Date Taking? Authorizing Provider  amoxicillin (AMOXIL) 500 MG capsule Take 2,000 mg by mouth See admin instructions. Take 2000 mg by mouth 1 hour before dental procedure 01/03/19  Yes [provider]  cholecalciferol (VITAMIN D) 1000 UNITS tablet Take 1,000 Units by mouth daily.    Yes [provider]  dicyclomine (BENTYL) 20 MG tablet Take 1 tablet (20 mg total) by mouth 2 (two) times daily as needed for spasms. 01/12/19  Yes Gareth Morgan, MD  diltiazem (CARDIZEM) 60 MG tablet Take 1 tablet (60 mg total) by mouth 3 (three) times daily. 01/31/19  Yes McAlhany, Annita Brod, MD  ELIQUIS 5 MG TABS tablet TAKE 1 TABLET BY MOUTH TWICE A DAY Patient taking differently: Take 5 mg by mouth 2 (two) times daily.  11/15/18  Yes Burnell Blanks, MD  escitalopram (LEXAPRO) 10 MG tablet TAKE 1 TABLET BY MOUTH EVERY DAY Patient taking differently: Take 10 mg by mouth daily.  12/14/18  Yes Burchette, Alinda Sierras, MD  furosemide (LASIX) 40 MG tablet Take 1 tablet (40mg ) by mouth twice a day and an extra 1/2 tablet (20mg ) twice a day as needed for edema. Patient taking differently: Take 20-40 mg by mouth See admin instructions. Take 40 mg by mouth twice daily; may take an extra 20 mg twice daily as needed for edema. 01/20/19  Yes Rai, Ripudeep K, MD  HYDROcodone-acetaminophen (NORCO/VICODIN) 5-325 MG tablet Take one tablet by mouth every 8 hours as needed for pain. May refill in two months. Patient taking differently: Take 1 tablet by mouth every 6 (six) hours as needed for moderate pain.  08/05/18  Yes Burchette, Alinda Sierras, MD  losartan (COZAAR) 100 MG tablet Take 100 mg by mouth daily. 02/03/19  Yes [provider]  metoprolol tartrate (LOPRESSOR) 100 MG tablet Take 1 tablet (100 mg total) by mouth 2 (two) times daily. 01/20/19  Yes Rai,  Ripudeep K, MD  ondansetron (ZOFRAN ODT) 4 MG disintegrating tablet Take 1 tablet (4 mg total) by mouth every 8 (eight) hours as needed for nausea or vomiting. 01/12/19  Yes Gareth Morgan, MD  potassium chloride SA (KLOR-CON) 20 MEQ tablet Take 1 tablet (20 mEq total) by mouth daily. 01/20/19  Yes Rai, Ripudeep K, MD  predniSONE (DELTASONE) 10 MG tablet 4 tablets for 3 days, 3 tablets for 3 days, 2 tablets for 3 days, 1 tablet continue Patient taking differently: Take 10 mg by mouth daily.  05/26/17  Yes Reyne Dumas, MD  rosuvastatin (CRESTOR) 10 MG tablet Take 10 mg by mouth at bedtime. 03/05/19  Yes [provider]  vitamin C (ASCORBIC ACID) 500 MG tablet Take 1,000 mg by mouth daily.    Yes [provider]  vitamin E (VITAMIN E) 400 UNIT capsule Take 400 Units by mouth daily.   Yes [provider]  losartan (COZAAR) 25 MG tablet Take 1 tablet (25 mg total) by mouth daily. Patient not taking: Reported on 03/06/2019 01/21/19   Rai, Vernelle Emerald, MD  pantoprazole (PROTONIX) 40 MG tablet TAKE 1 TABLET BY MOUTH EVERY DAY Patient not taking: Reported on 03/06/2019 02/17/19   Eulas Post, MD    Critical care time: 45 min.    Montey Hora, [facility] 03/06/2019, 6:19 PM

## 2019-03-06 NOTE — ED Notes (Signed)
[facility] called at this time. 

## 2019-03-06 NOTE — ED Triage Notes (Signed)
Per EMS - Pt coming from daughters house, with CC of generalized weakness and pain from throat to abd when she swallows. Pt has had 2 episodes of emesis, but denies any nausea.   ST w/ PVCs  101 51 125 HR 20 R 96% @ 2L  97.4

## 2019-03-06 NOTE — Consult Note (Signed)
[address]       [place] [address]             [phone]        Bert L Koudelka [place] Medical Record [id number] Date of Birth: [birth year]  Referring: No ref. provider found Primary Care: Eulas Post, MD Primary Cardiologist:Christopher Angelena Form, MD  Chief Complaint:   Nausea, heartburn  History of Present Illness:     Patient examined, images of today's CT scan and images of echocardiogram performed October 2020 and today compared and reviewed.  84 year old chronically ill female recently discharged from the hospital with diverticulitis treated with antibiotics presents to the ED with 2 days of fever, nausea, and heartburn type symptoms with some episodes of emesis.  In the ED her temperature is 103.  She had tachycardia and elevated lactate.  She had evidence of dehydration was given 3 L of fluid, cultures were obtained and she was placed on broad-spectrum antibiotics.  CT scan of the abdomen was unremarkable.  The lower mid pleural space bilaterally was without evidence of loculated effusion or empyema.  No evidence of lower lobe pneumonia.  The CT scan did possible pericardial effusion although the patient's echocardiogram in October 2020 showed fairly extensive epicardial fat pad which would have the same appearance on CT scan.  A follow-up transthoracic echocardiogram was performed today which shows a minimal amount of fluid, less than 1 cm in addition to the previously noted epicardial fat pad 8 weeks ago.  LV function is normal.  There is no compression of the right ventricle.  No evidence of endocarditis of the cardiac valves.  Patient is currently on 25 mcg/min of norepinephrine and has been given 3 L of fluid.  Her temperature is 103.  White count 18,000, Covid test negative x2, bicarb 24.  Creatinine 0.9, glucose 153.  Current Activity/ Functional Status: Sedentary activity because of chronic pain, back pain, history of fall nondisplaced  fracture of sacrum.   Zubrod Score: At the time of surgery this patients most appropriate activity status/level should be described as: []     0    Normal activity, no symptoms []     1    Restricted in physical strenuous activity but ambulatory, able to do out light work []     2    Ambulatory and capable of self care, unable to do work activities, up and about                 more than 50%  Of the time                            []     3    Only limited self care, in bed greater than 50% of waking hours []     4    Completely disabled, no self care, confined to bed or chair []     5    Moribund  Past Medical History:  Diagnosis Date   Abdominal pain, epigastric 06/22/2013   Abdominal pain, other specified site 12/24/2011   Abnormal urinalysis 05/20/2017   Acute respiratory failure with hypoxia ([place]) 05/20/2017   Anemia, unspecified 10/28/2012   Anxiety state, unspecified 10/28/2012   Bruises easily    d/t being on prednisone and plavix   CAD (coronary artery disease)    a. Stent RCA in [facility];  b. Cath approx 2009 - nonobs per pt report.  Cataract    immature on the left eye   Chronic insomnia 02/07/2013   Chronic lower back pain    scoliosis   CKD (chronic kidney disease) stage 3, GFR 0000000 ml/min    Complication of anesthesia    pt has a very high tolerance to meds   Coronary disease 05/20/2017   CVA (cerebral infarction) 10/29/2012   DDD (degenerative disc disease)    Depression    Diverticulitis    hx of   Diverticulosis    Elevated transaminase level 02/07/2013   Enteritis    GERD (gastroesophageal reflux disease) 09/01/2010   Giant cell arteritis (HCC)    Hemorrhoids    Herniated nucleus pulposus, L5-S1, right 11/04/2015   History of scabies    HTN (hypertension) 05/20/2017   Hyperlipidemia    takes Lipitor daily   Hypertension    takes Amlodipine,Losartan,Metoprolol,and HCTZ daily   Incontinence of urine    Influenza A 05/20/2017    Insomnia    takes Restoril nightly   Lumbar stenosis 04/25/2013   Major depressive disorder, recurrent episode, moderate ([place]) 07/16/2013   Migraines    "back in my 20's; none since" (10/28/2012)   Osteoporosis    PAF (paroxysmal atrial fibrillation) ([place]) 2011   a. lone epidode in 2011 according to notes.   Rheumatoid arthritis ([place])    Scoliosis    Sepsis ([place]) 05/20/2017   Sinus bradycardia    a. on chronic bb   Temporal arteritis ([place]) 2011   a. followed @ [place]; potential flareup 10/28/2012/notes 10/28/2012   Vocal cord dysfunction    "they don't operate properly" (10/28/2012)    Past Surgical History:  Procedure Laterality Date   ABDOMINAL HYSTERECTOMY  ~ 1984   vaginally   BACK SURGERY  7-19yrs ago   X Stop   BLADDER SUSPENSION  2001   BREAST BIOPSY Right    CATARACT EXTRACTION W/ INTRAOCULAR LENS IMPLANT Right ~ 08/2012   COLONOSCOPY  01/26/2012   Procedure: COLONOSCOPY;  Surgeon: Ladene Artist, MD,FACG;  Location: [facility] ENDOSCOPY;  Service: Endoscopy;  Laterality: N/A;  note the EGD is possible   CORONARY ANGIOPLASTY WITH STENT PLACEMENT  2006   X 1 stent   EPIDURAL BLOCK INJECTION     ESOPHAGOGASTRODUODENOSCOPY  01/26/2012   Procedure: ESOPHAGOGASTRODUODENOSCOPY (EGD);  Surgeon: Ladene Artist, MD,FACG;  Location: [facility] ENDOSCOPY;  Service: Endoscopy;  Laterality: N/A;   HEMIARTHROPLASTY HIP Right 2012   LAPAROSCOPIC CHOLECYSTECTOMY  2001   LUMBAR FUSION  03/2013   LUMBAR LAMINECTOMY/DECOMPRESSION MICRODISCECTOMY Right 11/04/2015   Procedure: Right Lumbar Five-Sacral One Microdiskectomy;  Surgeon: Kristeen Miss, MD;  Location: [facility]URO ORS;  Service: Neurosurgery;  Laterality: Right;  Right L5-S1 Microdiskectomy   moles removed that required stiches     one on leg and one on face   TEMPORAL ARTERY BIOPSY / LIGATION Bilateral 2011   TONSILLECTOMY AND ADENOIDECTOMY     at age 85   TUBAL LIGATION  ~ [place]  ~ 2010   "lower back"  (10/28/2012)    Social History   Tobacco Use  Smoking Status Never Smoker  Smokeless Tobacco Never Used    Social History   Substance and Sexual Activity  Alcohol Use Yes   Comment: socially     Allergies  Allergen Reactions   Dextromethorphan Rash    Current Facility-Administered Medications  Medication Dose Route Frequency Provider Last Rate Last Dose   acetaminophen (TYLENOL) tablet 1,000 mg  1,000 mg Oral Once  Virgel Manifold, MD   Stopped at 03/06/19 [phone]   alum & mag hydroxide-simeth (MAALOX/MYLANTA) 200-200-20 MG/5ML suspension 30 mL  30 mL Oral Once Delora Fuel, MD   Stopped at 03/06/19 [phone]   And   lidocaine (XYLOCAINE) 2 % viscous mouth solution 15 mL  15 mL Oral Once Delora Fuel, MD   Stopped at 03/06/19 [phone]   norepinephrine (LEVOPHED) 4mg  in 265mL premix infusion  0-40 mcg/min Intravenous Continuous Virgel Manifold, MD 97.5 mL/hr at 03/06/19 1300 26 mcg/min at 03/06/19 1300   ondansetron (ZOFRAN) injection 4 mg  4 mg Intravenous Once Delora Fuel, MD   Stopped at 03/06/19 0716   pantoprazole (PROTONIX) injection 40 mg  40 mg Intravenous Q24H Prescott Gum, Collier Salina, MD       sodium chloride (PF) 0.9 % injection            sterile water (preservative free) injection            Current Outpatient Medications  Medication Sig Dispense Refill   amoxicillin (AMOXIL) 500 MG capsule Take 2,000 mg by mouth See admin instructions. Take 2000 mg by mouth 1 hour before dental procedure     cholecalciferol (VITAMIN D) 1000 UNITS tablet Take 1,000 Units by mouth daily.      dicyclomine (BENTYL) 20 MG tablet Take 1 tablet (20 mg total) by mouth 2 (two) times daily as needed for spasms. 20 tablet 0   diltiazem (CARDIZEM) 60 MG tablet Take 1 tablet (60 mg total) by mouth 3 (three) times daily. 90 tablet 6   ELIQUIS 5 MG TABS tablet TAKE 1 TABLET BY MOUTH TWICE A DAY (Patient taking differently: Take 5 mg by mouth 2 (two) times daily. ) 60 tablet 5   escitalopram (LEXAPRO) 10  MG tablet TAKE 1 TABLET BY MOUTH EVERY DAY (Patient taking differently: Take 10 mg by mouth daily. ) 90 tablet 3   furosemide (LASIX) 40 MG tablet Take 1 tablet (40mg ) by mouth twice a day and an extra 1/2 tablet (20mg ) twice a day as needed for edema. (Patient taking differently: Take 20-40 mg by mouth See admin instructions. Take 40 mg by mouth twice daily; may take an extra 20 mg twice daily as needed for edema.) 135 tablet 3   HYDROcodone-acetaminophen (NORCO/VICODIN) 5-325 MG tablet Take one tablet by mouth every 8 hours as needed for pain. May refill in two months. (Patient taking differently: Take 1 tablet by mouth every 6 (six) hours as needed for moderate pain. ) 90 tablet 0   losartan (COZAAR) 100 MG tablet Take 100 mg by mouth daily.     metoprolol tartrate (LOPRESSOR) 100 MG tablet Take 1 tablet (100 mg total) by mouth 2 (two) times daily. 60 tablet 2   ondansetron (ZOFRAN ODT) 4 MG disintegrating tablet Take 1 tablet (4 mg total) by mouth every 8 (eight) hours as needed for nausea or vomiting. 20 tablet 0   potassium chloride SA (KLOR-CON) 20 MEQ tablet Take 1 tablet (20 mEq total) by mouth daily. 90 tablet 1   predniSONE (DELTASONE) 10 MG tablet 4 tablets for 3 days, 3 tablets for 3 days, 2 tablets for 3 days, 1 tablet continue (Patient taking differently: Take 10 mg by mouth daily. ) 90 tablet 1   rosuvastatin (CRESTOR) 10 MG tablet Take 10 mg by mouth at bedtime.     vitamin C (ASCORBIC ACID) 500 MG tablet Take 1,000 mg by mouth daily.      vitamin E (VITAMIN E) 400  UNIT capsule Take 400 Units by mouth daily.     losartan (COZAAR) 25 MG tablet Take 1 tablet (25 mg total) by mouth daily. (Patient not taking: Reported on 03/06/2019) 30 tablet 3   pantoprazole (PROTONIX) 40 MG tablet TAKE 1 TABLET BY MOUTH EVERY DAY (Patient not taking: Reported on 03/06/2019) 90 tablet 1    (Not in a hospital admission)   Family History  Problem Relation Age of Onset   Brain cancer Mother  61   Heart attack Father    Breast cancer Daughter 62   Heart disease Other    Hypertension Other    Arthritis Neg Hx    Colon cancer Neg Hx    Osteoporosis Neg Hx      Review of Systems:   ROS Patient has temporal arteritis on chronic prednisone Patient has chronic atrial fibrillation on Eliquis Patient has had previous PCI coronary stent but no symptoms of angina. Stress test 2019 -  LV function normal by echo.  Cardiac enzymes negative today.   Cardiac Review of Systems: Y or  [    ]= no  Chest Pain [ y   ]  Resting SOB [   ] Exertional SOB  Blue.Reese  ]  Orthopnea [  ]   Pedal Edema [   ]    Palpitations [  ] Syncope  [  ]   Presyncope [   ]  General Review of Systems: [Y] = yes [  ]=no Constitional: recent weight change Blue.Reese  ]; anorexia [  ]; fatigue Blue.Reese  ]; nausea [  ]; night sweats [  ]; fever [  ]; or chills [ y ]                                                               Dental: Last Dentist visit: 1 year  Eye : blurred vision [  ]; diplopia [   ]; vision changes [  ];  Amaurosis fugax[  ]; Resp: cough [  ];  wheezing[  ];  hemoptysis[  ]; shortness of breath[  ]; paroxysmal nocturnal dyspnea[  ]; dyspnea on exertion[  ]; or orthopnea[  ];  GI:  gallstones[  ], vomiting[y  ];  dysphagia[  ]; melena[  ];  hematochezia [  ]; heartburn[y  ];   Hx of  Colonoscopy[  ]; GU: kidney stones [  ]; hematuria[  ];   dysuria [  ];  nocturia[  ];  history of     obstruction [  ]; urinary frequency [  ]             Skin: rash, swelling[  ];, hair loss[  ];  peripheral edema[  ];  or itching[  ]; Musculosketetal: myalgias[  ];  joint swelling[  ];  joint erythema[  ];  joint pain[  ];  back pain[ y ];  Heme/Lymph: bruising[y  ];  bleeding[  ];  anemia[  ];  Neuro: TIA[  ];  headaches[y  ];  stroke[  ];  vertigo[  ];  seizures[  ];   paresthesias[  ];  difficulty walking[  ];  Psych:depression[  ]; anxiety[  ];  Endocrine: diabetes[  ];  thyroid dysfunction[  ];  Physical Exam: BP 107/70    Pulse (!) 159    Temp (!) 103.3 F (39.6 C) (Rectal)    Resp 15    Ht 5' 4.5" (1.638 m)    Wt 72.6 kg    SpO2 95%    BMI 27.04 kg/m        Physical Exam  General: Frail elderly female with significant arthritic deformities. HEENT: Normocephalic pupils equal , dentition adequate, upper plates. Neck: Supple without JVD, adenopathy, or bruit Chest: Clear to auscultation, symmetrical breath sounds, no rhonchi, no tenderness             or deformity Cardiovascular: Irregular rate and rhythm, no murmur, no gallop, peripheral pulses palpable.    No pericardial friction rub.          Abdomen:  Soft, nontender, no palpable mass or organomegaly Extremities: Warm, well-perfused, no clubbing cyanosis edema or tenderness,              no venous stasis changes of the legs Rectal/GU: Deferred Neuro: Grossly non--focal and symmetrical throughout Skin: Clean and dry without rash or ulceration   Diagnostic Studies & Laboratory data:     Recent Radiology Findings:   Ct Abdomen Pelvis W Contrast  Result Date: 03/06/2019 CLINICAL DATA:  Acute generalized abdominal pain with fever. "Esophageal pain" EXAM: CT ABDOMEN AND PELVIS WITH CONTRAST TECHNIQUE: Multidetector CT imaging of the abdomen and pelvis was performed using the standard protocol following bolus administration of intravenous contrast. CONTRAST:  154mL OMNIPAQUE IOHEXOL 300 MG/ML  SOLN COMPARISON:  02/11/2019 FINDINGS: Lower chest: Moderate to large pericardial effusion that is new from prior, up to 2 cm in thickness. Smooth serosal enhancement is seen on both the visceral and parietal surfaces. The ventricular septum appears straight rather than S-shaped. Mild interstitial coarsening at the lung bases, suspect mild interstitial edema. Coronary calcification. Hepatobiliary: Redemonstrated intra and extrahepatic biliary dilatation. There is perfusion anomaly in the central liver which is nonspecific and also  seen previously.Cholecystectomy. No calcified choledocholithiasis. Pancreas: Unremarkable. Spleen: Unremarkable. Adrenals/Urinary Tract: Negative adrenals. No hydronephrosis or stone. Unremarkable bladder. Stomach/Bowel: No obstruction. No appendicitis. Multiple colonic diverticula. Vascular/Lymphatic: No acute vascular abnormality. Atherosclerotic calcification. No flattening of central venous structures. No mass or adenopathy. Reproductive:Hysterectomy. Other: No ascites or pneumoperitoneum. Musculoskeletal: No acute abnormalities. Remote T12 and L1 cement augmentation. L2-L4 fusion with solid arthrodesis. Avulsion fracture with early periosteal reaction at the left ischial tuberosity. Gas containing right paracentral herniation at L4-5 with implied impingement. Healing right sacral ala insufficiency fracture. Right hip arthroplasty. IMPRESSION: 1. Pericarditis with moderate to large pericardial effusion that is new from CT last month. 2. Subacute left ischial tuberosity avulsion fracture. Stable, subacute to chronic right sacral ala insufficiency fracture. 3. Chronic biliary dilatation post cholecystectomy Electronically Signed   By: Monte Fantasia M.D.   On: 03/06/2019 10:21   Dg Chest Port 1 View  Result Date: 03/06/2019 CLINICAL DATA:  Chest pain. Generalized weakness. EXAM: PORTABLE CHEST 1 VIEW COMPARISON:  Radiograph 01/13/2019, CT 05/20/2017 FINDINGS: Upper normal heart size. Unchanged mediastinal contours. Multiple overlying monitoring devices project over the left greater than right chest. Minimal streaky atelectasis or scarring in the left mid lung. No acute airspace disease. No pleural fluid or pneumothorax. Degenerative change in the shoulders. IMPRESSION: 1. No acute abnormality. 2. Minimal scarring in the left mid lung. Electronically Signed   By: Keith Rake M.D.   On: 03/06/2019 06:50     I have independently reviewed the above radiologic studies and  discussed with the patient    Recent Lab Findings: Lab Results  Component Value Date   WBC 18.4 (H) 03/06/2019   HGB 13.0 03/06/2019   HCT 41.7 03/06/2019   PLT 224 03/06/2019   GLUCOSE 153 (H) 03/06/2019   CHOL 171 11/14/2018   TRIG 168.0 (H) 11/14/2018   HDL 63.40 11/14/2018   LDLCALC 74 11/14/2018   ALT 47 (H) 03/06/2019   AST 44 (H) 03/06/2019   NA 140 03/06/2019   K 3.9 03/06/2019   CL 101 03/06/2019   CREATININE 0.96 03/06/2019   BUN 17 03/06/2019   CO2 24 03/06/2019   TSH 3.884 01/19/2019   INR 1.3 (H) 03/06/2019   HGBA1C 5.8 03/31/2017      Assessment / Plan:   84 year old fragile female febrile with nausea emesis and heartburn. Recently hospitalized for diverticulitis. Chronic problems with atrial fibrillation and temporal arteritis on prednisone Echocardiograms from October 2020 and today personally reviewed.  8 weeks ago she noted to have a significant epicardial fat pad.  Biventricular function normal.  There is some slight pericardial effusion in addition to the fat pad today but less than 1 cm.  No evidence of cardiac tamponade.  No evidence of endocarditis.  I doubt that the patient has suppurative pericarditis and would not recommend surgery on this patient currently who is now febrile 103  on significant dose of norepinephrine.  I would recommend follow-up echocardiogram in 24 hours as well as planned ICU admission broad-spectrum antibiotics and hemodynamic monitoring.        @ME1 @ 03/06/2019 2:19 PM

## 2019-03-06 NOTE — ED Notes (Signed)
RN called [facility] to give report and ICU RN will call back.

## 2019-03-06 NOTE — ED Provider Notes (Signed)
[facility] DEPT Provider Note   CSN: [id number] Arrival date & time: 03/06/19  0532    History   Chief Complaint Chief Complaint  Patient presents with  . generalized weakness  . gi pain    HPI Betty Jackson is a 84 y.o. female.   The history is provided by the patient.  She has history of hypertension, hyperlipidemia, coronary artery disease, chronic kidney disease, GERD, diverticulitis, temporal arteritis on chronic prednisone, paroxysmal atrial fibrillation anticoagulated on apixaban and comes in complaining of pain in her esophagus extending down into her epigastric area for the last 3 days.  Pain has been constant but she has difficulty describing it, stating it just feels like she cannot swallow.  There is no radiation of pain to the back.  She is unable to put a number on the pain.  There has been associated nausea and she has vomited.  Pain is improved slightly after vomiting.  She denies dyspnea or sweats.  Pain does not seem to be impacted by eating or by body position.  She has not taken anything for pain.  She is a non-smoker.  Past Medical History:  Diagnosis Date  . Abdominal pain, epigastric 06/22/2013  . Abdominal pain, other specified site 12/24/2011  . Abnormal urinalysis 05/20/2017  . Acute respiratory failure with hypoxia ([place]) 05/20/2017  . Anemia, unspecified 10/28/2012  . Anxiety state, unspecified 10/28/2012  . Bruises easily    d/t being on prednisone and plavix  . CAD (coronary artery disease)    a. Stent RCA in [facility];  b. Cath approx 2009 - nonobs per pt report.  . Cataract    immature on the left eye  . Chronic insomnia 02/07/2013  . Chronic lower back pain    scoliosis  . CKD (chronic kidney disease) stage 3, GFR 30-59 ml/min   . Complication of anesthesia    pt has a very high tolerance to meds  . Coronary disease 05/20/2017  . CVA (cerebral infarction) 10/29/2012  . DDD (degenerative disc disease)   .  Depression   . Diverticulitis    hx of  . Diverticulosis   . Elevated transaminase level 02/07/2013  . Enteritis   . GERD (gastroesophageal reflux disease) 09/01/2010  . Giant cell arteritis ([place])   . Hemorrhoids   . Herniated nucleus pulposus, L5-S1, right 11/04/2015  . History of scabies   . HTN (hypertension) 05/20/2017  . Hyperlipidemia    takes Lipitor daily  . Hypertension    takes Amlodipine,Losartan,Metoprolol,and HCTZ daily  . Incontinence of urine   . Influenza A 05/20/2017  . Insomnia    takes Restoril nightly  . Lumbar stenosis 04/25/2013  . Major depressive disorder, recurrent episode, moderate ([place]) 07/16/2013  . Migraines    "back in my 20's; none since" (10/28/2012)  . Osteoporosis   . PAF (paroxysmal atrial fibrillation) ([place]) 2011   a. lone epidode in 2011 according to notes.  . Rheumatoid arthritis ([place])   . Scoliosis   . Sepsis ([place]) 05/20/2017  . Sinus bradycardia    a. on chronic bb  . Temporal arteritis ([place]) 2011   a. followed @ [facility]; potential flareup 10/28/2012/notes 10/28/2012  . Vocal cord dysfunction    "they don't operate properly" (10/28/2012)    Patient Active Problem List   Diagnosis Date Noted  . Atrial fibrillation with rapid ventricular response ([place])   . Nausea 01/17/2019  . Acute diverticulitis 01/13/2019  . AKI (acute kidney injury) ([place])  01/13/2019  . Chronic pain 01/13/2019  . Tachycardia 01/13/2019  . Hypokalemia 01/13/2019  . Chronic low back pain 07/10/2018  . Influenza A 05/20/2017  . Sepsis ([place]) 05/20/2017  . Acute respiratory failure with hypoxia ([place]) 05/20/2017  . CKD (chronic kidney disease) stage 3, GFR 30-59 ml/min 05/20/2017  . PAF (paroxysmal atrial fibrillation) ([place]) 05/20/2017  . HTN (hypertension) 05/20/2017  . Giant cell arteritis ([place]) 05/20/2017  . Coronary disease 05/20/2017  . Abnormal urinalysis 05/20/2017  . Osteoporosis 05/08/2016  . Herniated nucleus pulposus, L5-S1, right 11/04/2015  . Major depressive  disorder, recurrent episode, moderate ([place]) 07/16/2013  . Abdominal pain, epigastric 06/22/2013  . Lumbar stenosis 04/25/2013  . Chronic insomnia 02/07/2013  . Elevated transaminase level 02/07/2013  . CVA (cerebral infarction) 10/29/2012  . Visual changes 10/28/2012  . Depression 10/28/2012  . Anxiety state, unspecified 10/28/2012  . Anemia, unspecified 10/28/2012  . Nonspecific (abnormal) findings on radiological and other examination of gastrointestinal tract 01/25/2012  . Hyponatremia 01/24/2012  . Hematuria 01/24/2012  . Enteritis 12/24/2011  . Abdominal pain, other specified site 12/24/2011  . Leg pain, right 02/18/2011  . Temporal arteritis ([place]) 09/01/2010  . Hypertension 09/01/2010  . Hyperlipidemia 09/01/2010  . History of atrial fibrillation 09/01/2010  . CAD (coronary artery disease) 09/01/2010  . GERD (gastroesophageal reflux disease) 09/01/2010  . Insomnia 09/01/2010    Past Surgical History:  Procedure Laterality Date  . ABDOMINAL HYSTERECTOMY  ~ 1984   vaginally  . BACK SURGERY  7-13yrs ago   X Stop  . BLADDER SUSPENSION  2001  . BREAST BIOPSY Right   . CATARACT EXTRACTION W/ INTRAOCULAR LENS IMPLANT Right ~ 08/2012  . COLONOSCOPY  01/26/2012   Procedure: COLONOSCOPY;  Surgeon: Ladene Artist, MD,FACG;  Location: [facility] ENDOSCOPY;  Service: Endoscopy;  Laterality: N/A;  note the EGD is possible  . CORONARY ANGIOPLASTY WITH STENT PLACEMENT  2006   X 1 stent  . EPIDURAL BLOCK INJECTION    . ESOPHAGOGASTRODUODENOSCOPY  01/26/2012   Procedure: ESOPHAGOGASTRODUODENOSCOPY (EGD);  Surgeon: Ladene Artist, MD,FACG;  Location: [facility] ENDOSCOPY;  Service: Endoscopy;  Laterality: N/A;  . HEMIARTHROPLASTY HIP Right 2012  . LAPAROSCOPIC CHOLECYSTECTOMY  2001  . LUMBAR FUSION  03/2013  . LUMBAR LAMINECTOMY/DECOMPRESSION MICRODISCECTOMY Right 11/04/2015   Procedure: Right Lumbar Five-Sacral One Microdiskectomy;  Surgeon: Kristeen Miss, MD;  Location: [facility] NEURO ORS;  Service:  Neurosurgery;  Laterality: Right;  Right L5-S1 Microdiskectomy  . moles removed that required stiches     one on leg and one on face  . TEMPORAL ARTERY BIOPSY / LIGATION Bilateral 2011  . TONSILLECTOMY AND ADENOIDECTOMY     at age 5  . TUBAL LIGATION  ~ 1982  . X-STOP IMPLANTATION  ~ 2010   "lower back" (10/28/2012)     OB History   No obstetric history on file.      Home Medications    Prior to Admission medications   Medication Sig Start Date End Date Taking? Authorizing Provider  amoxicillin (AMOXIL) 500 MG capsule TAKE 4 CAPSULES BY MOUTH 1 HOUR BEFORE DENTAL PROCEDURE 01/03/19   [provider]  cholecalciferol (VITAMIN D) 1000 UNITS tablet Take 1,000 Units by mouth daily.     [provider]  dicyclomine (BENTYL) 20 MG tablet Take 1 tablet (20 mg total) by mouth 2 (two) times daily as needed for spasms. 01/12/19   Gareth Morgan, MD  diltiazem (CARDIZEM) 60 MG tablet Take 1 tablet (60 mg total) by mouth 3 (three)  times daily. 01/31/19   Burnell Blanks, MD  ELIQUIS 5 MG TABS tablet TAKE 1 TABLET BY MOUTH TWICE A DAY Patient taking differently: Take 5 mg by mouth 2 (two) times daily.  11/15/18   Burnell Blanks, MD  escitalopram (LEXAPRO) 10 MG tablet TAKE 1 TABLET BY MOUTH EVERY DAY Patient taking differently: Take 10 mg by mouth daily.  12/14/18   Burchette, Alinda Sierras, MD  furosemide (LASIX) 40 MG tablet Take 1 tablet (40mg ) by mouth twice a day and an extra 1/2 tablet (20mg ) twice a day as needed for edema. 01/20/19   Rai, Vernelle Emerald, MD  HYDROcodone-acetaminophen (NORCO/VICODIN) 5-325 MG tablet Take one tablet by mouth every 8 hours as needed for pain. May refill in two months. Patient taking differently: Take 1 tablet by mouth every 6 (six) hours as needed for moderate pain.  08/05/18   Burchette, Alinda Sierras, MD  losartan (COZAAR) 25 MG tablet Take 1 tablet (25 mg total) by mouth daily. 01/21/19   Rai, Vernelle Emerald, MD  metoprolol tartrate (LOPRESSOR)  100 MG tablet Take 1 tablet (100 mg total) by mouth 2 (two) times daily. 01/20/19   Rai, Ripudeep K, MD  ondansetron (ZOFRAN ODT) 4 MG disintegrating tablet Take 1 tablet (4 mg total) by mouth every 8 (eight) hours as needed for nausea or vomiting. 01/12/19   Gareth Morgan, MD  pantoprazole (PROTONIX) 40 MG tablet TAKE 1 TABLET BY MOUTH EVERY DAY 02/17/19   Burchette, Alinda Sierras, MD  potassium chloride SA (KLOR-CON) 20 MEQ tablet Take 1 tablet (20 mEq total) by mouth daily. 01/20/19   Rai, Ripudeep K, MD  predniSONE (DELTASONE) 10 MG tablet 4 tablets for 3 days, 3 tablets for 3 days, 2 tablets for 3 days, 1 tablet continue Patient taking differently: Take 10 mg by mouth daily.  05/26/17   Reyne Dumas, MD  vitamin C (ASCORBIC ACID) 500 MG tablet Take 1,000 mg by mouth daily as needed (for sore throat).     [provider]  vitamin E (VITAMIN E) 400 UNIT capsule Take 400 Units by mouth daily.    [provider]    Family History Family History  Problem Relation Age of Onset  . Brain cancer Mother 69  . Heart attack Father   . Breast cancer Daughter 58  . Heart disease Other   . Hypertension Other   . Arthritis Neg Hx   . Colon cancer Neg Hx   . Osteoporosis Neg Hx     Social History Social History   Tobacco Use  . Smoking status: Never Smoker  . Smokeless tobacco: Never Used  Substance Use Topics  . Alcohol use: Yes    Comment: socially  . Drug use: No     Allergies   Dextromethorphan   Review of Systems Review of Systems  All other systems reviewed and are negative.    Physical Exam Updated Vital Signs BP 115/68 (BP Location: Right Arm)   Pulse (!) 129   Temp 100 F (37.8 C) (Oral)   Resp (!) 33   Ht 5' 4.5" (1.638 m)   Wt 72.6 kg   SpO2 95%   BMI 27.04 kg/m   Physical Exam Vitals signs and nursing note reviewed.    84 year old female, resting comfortably and in no acute distress. Vital signs are significant for rapid heart rate and  elevated respiratory rate. Oxygen saturation is 95%, which is normal. Head is normocephalic and atraumatic. PERRLA, EOMI. Oropharynx is  clear. Neck is nontender and supple without adenopathy or JVD. Back is nontender and there is no CVA tenderness. Lungs are clear without rales, wheezes, or rhonchi. Chest has mild bilateral parasternal tenderness without crepitus. Heart is tachycardic without murmur.  Occasional skipped beats are noted. Abdomen is soft, flat, with mild to moderate epigastric tenderness.  There is no rebound or guarding.  There are no masses or hepatosplenomegaly and peristalsis is hypoactive. Extremities have no cyanosis or edema, full range of motion is present. Skin is warm and dry without rash. Neurologic: Mental status is normal, cranial nerves are intact, there are no motor or sensory deficits.  ED Treatments / Results  Labs (all labs ordered are listed, but only abnormal results are displayed) Labs Reviewed  CBC WITH DIFFERENTIAL/PLATELET - Abnormal; Notable for the following components:      Result Value   WBC 18.4 (*)    Neutro Abs 13.1 (*)    Monocytes Absolute 1.9 (*)    Abs Immature Granulocytes 0.10 (*)    All other components within normal limits  CULTURE, BLOOD (ROUTINE X 2)  CULTURE, BLOOD (ROUTINE X 2)  URINE CULTURE  SARS CORONAVIRUS 2 (TAT 6-24 HRS)  COMPREHENSIVE METABOLIC PANEL  LIPASE, BLOOD  LACTIC ACID, PLASMA  LACTIC ACID, PLASMA  APTT  PROTIME-INR  URINALYSIS, ROUTINE W REFLEX MICROSCOPIC  TROPONIN I (HIGH SENSITIVITY)    EKG EKG Interpretation  Date/Time:  Monday March 06 2019 05:54:39 EST Ventricular Rate:  128 PR Interval:    QRS Duration: 77 QT Interval:  303 QTC Calculation: 443 R Axis:   21 Text Interpretation: Sinus tachycardia Ventricular premature complex Low voltage, precordial leads Abnormal R-wave progression, early transition Borderline T abnormalities, anterior leads When compared with ECG of 02/11/2019, HEART  RATE has increased Premature ventricular complexes are now present Confirmed by Delora Fuel ([id number]) on 03/06/2019 6:05:46 AM   Radiology Dg Chest Port 1 View  Result Date: 03/06/2019 CLINICAL DATA:  Chest pain. Generalized weakness. EXAM: PORTABLE CHEST 1 VIEW COMPARISON:  Radiograph 01/13/2019, CT 05/20/2017 FINDINGS: Upper normal heart size. Unchanged mediastinal contours. Multiple overlying monitoring devices project over the left greater than right chest. Minimal streaky atelectasis or scarring in the left mid lung. No acute airspace disease. No pleural fluid or pneumothorax. Degenerative change in the shoulders. IMPRESSION: 1. No acute abnormality. 2. Minimal scarring in the left mid lung. Electronically Signed   By: Keith Rake M.D.   On: 03/06/2019 06:50    Procedures Procedures  CRITICAL CARE Performed by: Delora Fuel Total critical care time: 50 minutes Critical care time was exclusive of separately billable procedures and treating other patients. Critical care was necessary to treat or prevent imminent or life-threatening deterioration. Critical care was time spent personally by me on the following activities: development of treatment plan with patient and/or surrogate as well as nursing, discussions with consultants, evaluation of patient's response to treatment, examination of patient, obtaining history from patient or surrogate, ordering and performing treatments and interventions, ordering and review of laboratory studies, ordering and review of radiographic studies, pulse oximetry and re-evaluation of patient's condition.  Medications Ordered in ED Medications  ondansetron (ZOFRAN) injection 4 mg (has no administration in time range)  alum & mag hydroxide-simeth (MAALOX/MYLANTA) 200-200-20 MG/5ML suspension 30 mL (has no administration in time range)    And  lidocaine (XYLOCAINE) 2 % viscous mouth solution 15 mL (has no administration in time range)  pantoprazole  (PROTONIX) EC tablet 40 mg (has no administration in time  range)  lactated ringers bolus 1,000 mL (1,000 mLs Intravenous New Bag/Given 03/06/19 0645)    And  lactated ringers bolus 1,000 mL (1,000 mLs Intravenous New Bag/Given 03/06/19 0650)    And  lactated ringers bolus 250 mL (has no administration in time range)  ceFEPIme (MAXIPIME) 2 g in sodium chloride 0.9 % 100 mL IVPB (2 g Intravenous New Bag/Given 03/06/19 0643)  metroNIDAZOLE (FLAGYL) IVPB 500 mg (500 mg Intravenous New Bag/Given 03/06/19 0649)  vancomycin (VANCOCIN) 1,500 mg in sodium chloride 0.9 % 500 mL IVPB (has no administration in time range)  acetaminophen (TYLENOL) tablet 1,000 mg (has no administration in time range)     Initial Impression / Assessment and Plan / ED Course  I have reviewed the triage vital signs and the nursing notes.  Pertinent labs & imaging results that were available during my care of the patient were reviewed by me and considered in my medical decision making (see chart for details).  Chest and epigastric pain which seems most likely to be GERD, but consider coronary artery disease.  No respiratory symptoms to suggest respiratory etiology.  She is anticoagulated, so pulmonary embolism is very unlikely.  Old records are reviewed, and she had been admitted for diverticulitis 2 months ago and discharged with prescriptions for ciprofloxacin and metronidazole.  Symptoms are unlikely to be from antibiotics from 5 weeks ago.  ECG shows tachycardia and PVCs, but no acute process.  We will give IV fluids, GI cocktail, pantoprazole and check screening labs including troponin is and also check chest x-ray.  Temperature is noted to be elevated although not technically a fever.  Blood pressure is dropped below 100.  Will initiate septic work-up and started on empiric antibiotics and give early, goal-directed fluids.  Rectal temperature his 103.2, confirming sepsis.  Chest x-ray shows no acute process.  WBC is elevated  but without a left shift.  Case is signed out to Dr. Wilson Singer.  Final Clinical Impressions(s) / ED Diagnoses   Final diagnoses:  Sepsis due to undetermined organism [facility])  Atypical chest pain  Chronic anticoagulation    ED Discharge Orders    None       Delora Fuel, MD [id number] [phone]

## 2019-03-06 NOTE — ED Notes (Signed)
RN removed patient's L hand IV due to continuous bleeding around IV site from patient being on blood thinners. 10 minutes heavy pressure was applied and patient currently has pressure dressing. Bleeding has stopped.

## 2019-03-06 NOTE — ED Provider Notes (Signed)
New decent sized effusion noted on imaging. Purulent pericarditis? If so, hematogenous spread from recent diverticulitis? She has epigastric/substernal CP.  BP soft.  Concerned she may be starting to develop tamponade. Discussed with CTS via phone. Requesting STAT ECHO. They did not recommend reversal of anticoagulation at this time. Abx already ordered.   BP soft. Could be from afib with RVR. Could be related to sepsis or cardiac/tamponade though. Deferring from rate controlling meds until clinical picture more clear. Central line placed.  CENTRAL LINE Performed by: Virgel Manifold Consent: The procedure was performed in an emergent situation. Required items: required blood products, implants, devices, and special equipment available Patient identity confirmed: arm band and provided demographic data Time out: Immediately prior to procedure a "time out" was called to verify the correct patient, procedure, equipment, support staff and site/side marked as required. Indications: vascular access Anesthesia: local infiltration Local anesthetic: lidocaine 1% with epinephrine Anesthetic total: 3 ml Patient sedated: no Preparation: skin prepped with 2% chlorhexidine Skin prep agent dried: skin prep agent completely dried prior to procedure Sterile barriers: all five maximum sterile barriers used - cap, mask, sterile gown, sterile gloves, and large sterile sheet Hand hygiene: hand hygiene performed prior to central venous catheter insertion  Location details: ECHO being performed at time of placement. R femoral line placed.  Catheter type: triple lumen Catheter size: 8 Fr Pre-procedure: landmarks identified Ultrasound guidance: yes Successful placement: yes Post-procedure: line sutured and dressing applied Assessment: blood return through all parts, free fluid flow, placement verified by x-ray and no pneumothorax on x-ray Patient tolerance: Patient tolerated the procedure well with no immediate  complications.  CRITICAL CARE Performed by: Virgel Manifold Total critical care time: 40 minutes Critical care time was exclusive of separately billable procedures and treating other patients. Critical care was necessary to treat or prevent imminent or life-threatening deterioration. Critical care was time spent personally by me on the following activities: development of treatment plan with patient and/or surrogate as well as nursing, discussions with consultants, evaluation of patient's response to treatment, examination of patient, obtaining history from patient or surrogate, ordering and performing treatments and interventions, ordering and review of laboratory studies, ordering and review of radiographic studies, pulse oximetry and re-evaluation of patient's condition.  Betty Jackson was evaluated in Emergency Department on 03/06/2019 for the symptoms described in the history of present illness. She was evaluated in the context of the global COVID-19 pandemic, which necessitated consideration that the patient might be at risk for infection with the SARS-CoV-2 virus that causes COVID-19. Institutional protocols and algorithms that pertain to the evaluation of patients at risk for COVID-19 are in a state of rapid change based on information released by regulatory bodies including the CDC and federal and state organizations. These policies and algorithms were followed during the patient's care in the ED.      Virgel Manifold, MD 03/07/19 [phone]

## 2019-03-06 NOTE — ED Notes (Signed)
Patient on 4L [place] at 94% with bedside forehead O2 monitor.

## 2019-03-06 NOTE — Progress Notes (Signed)
  Echocardiogram 2D Echocardiogram has been performed.  Betty Jackson 03/06/2019, 11:26 AM

## 2019-03-06 NOTE — Progress Notes (Signed)
eLink Physician-Brief Progress Note Patient Name: Betty Jackson DOB: January 20, 1935 MRN: [id number]   Date of Service  03/06/2019  HPI/Events of Note  Multiple issues: 1. Last lactic acid level = 3.8 and 2. Patient requests home Norco.   eICU Interventions  Will order: 1. Lactic Acid level now.  2. Norco 5 mg/325 mg PO Q 8 hours PRN pain.     Intervention Category Major Interventions: Other:  Sommer,Steven Cornelia Copa 03/06/2019, 9:03 PM

## 2019-03-06 NOTE — ED Notes (Signed)
Pt placed on simple face mask d/t mouth breathing, inconsistently breathing through nose. Will continue to monitor.

## 2019-03-06 NOTE — ED Notes (Signed)
Cardiothoracic surgeon at bedside 

## 2019-03-06 NOTE — Plan of Care (Signed)
  Problem: Education: Goal: Knowledge of General Education information will improve Description Including pain rating scale, medication(s)/side effects and non-pharmacologic comfort measures Outcome: Progressing   

## 2019-03-06 NOTE — ED Notes (Signed)
Phlebotomy called at this time.  

## 2019-03-06 NOTE — Progress Notes (Signed)
Pharmacy Antibiotic Note  Betty Jackson is a 84 y.o. female admitted on 03/06/2019 with  Epigastric pain, hypotension and concern for septic shock.    Pharmacy has been consulted for vancomycin and zosyn dosing. Cr 1, WBC 18 Tm 103  Plan: Vancomycin 1500mg  IV x1 given at [facility] this am will continue 750mg  IV q24h Zosyn 3.375mg  IV q8h EI  Height: 5' 4.5" (163.8 cm) Weight: 160 lb (72.6 kg) IBW/kg (Calculated) : 55.85  Temp (24hrs), Avg:100.9 F (38.3 C), Min:97.7 F (36.5 C), Max:103.3 F (39.6 C)  Recent Labs  Lab 03/06/19 0625 03/06/19 0626 03/06/19 0826  WBC 18.4*  --   --   CREATININE 0.96  --   --   LATICACIDVEN  --  2.3* 3.8*    Estimated Creatinine Clearance: 44.6 mL/min (by C-G formula based on SCr of 0.96 mg/dL).    Allergies  Allergen Reactions  . Dextromethorphan Rash    Antimicrobials this admission:   Dose adjustments this admission:   Microbiology results:   Bonnita Nasuti Pharm.D. CPP, BCPS Clinical Pharmacist [phone] 03/06/2019 8:39 PM

## 2019-03-07 ENCOUNTER — Inpatient Hospital Stay (HOSPITAL_COMMUNITY): Payer: Medicare Other

## 2019-03-07 ENCOUNTER — Other Ambulatory Visit (HOSPITAL_COMMUNITY): Payer: Medicare Other

## 2019-03-07 DIAGNOSIS — E872 Acidosis: Secondary | ICD-10-CM

## 2019-03-07 DIAGNOSIS — I313 Pericardial effusion (noninflammatory): Secondary | ICD-10-CM

## 2019-03-07 DIAGNOSIS — E43 Unspecified severe protein-calorie malnutrition: Secondary | ICD-10-CM

## 2019-03-07 DIAGNOSIS — I4891 Unspecified atrial fibrillation: Secondary | ICD-10-CM

## 2019-03-07 LAB — LACTIC ACID, PLASMA: Lactic Acid, Venous: 4.1 mmol/L (ref 0.5–1.9)

## 2019-03-07 LAB — ECHOCARDIOGRAM LIMITED
Height: 64.5 in
Weight: 2631.41 oz

## 2019-03-07 LAB — COOXEMETRY PANEL
Carboxyhemoglobin: 1 % (ref 0.5–1.5)
Methemoglobin: 1.1 % (ref 0.0–1.5)
O2 Saturation: 61.7 %
Total hemoglobin: 10 g/dL — ABNORMAL LOW (ref 12.0–16.0)

## 2019-03-07 LAB — COXSACKIE A VIRUS ANTIBODIES
Coxsackie A16 IgG: 1:200 {titer} — ABNORMAL HIGH
Coxsackie A16 IgM: NEGATIVE titer
Coxsackie A24 IgG: 1:200 {titer} — ABNORMAL HIGH
Coxsackie A24 IgM: NEGATIVE titer
Coxsackie A7 IgG: 1:100 {titer} — ABNORMAL HIGH
Coxsackie A7 IgM: NEGATIVE titer
Coxsackie A9 IgG: 1:100 {titer} — ABNORMAL HIGH
Coxsackie A9 IgM: NEGATIVE titer

## 2019-03-07 LAB — PREPARE RBC (CROSSMATCH)

## 2019-03-07 LAB — BASIC METABOLIC PANEL
Anion gap: 12 (ref 5–15)
BUN: 20 mg/dL (ref 8–23)
CO2: 20 mmol/L — ABNORMAL LOW (ref 22–32)
Calcium: 7.8 mg/dL — ABNORMAL LOW (ref 8.9–10.3)
Chloride: 107 mmol/L (ref 98–111)
Creatinine, Ser: 1.49 mg/dL — ABNORMAL HIGH (ref 0.44–1.00)
GFR calc Af Amer: 38 mL/min — ABNORMAL LOW (ref >60)
GFR calc non Af Amer: 32 mL/min — ABNORMAL LOW (ref >60)
Glucose, Bld: 241 mg/dL — ABNORMAL HIGH (ref 70–99)
Potassium: 4.4 mmol/L (ref 3.5–5.1)
Sodium: 139 mmol/L (ref 135–145)

## 2019-03-07 LAB — MAGNESIUM: Magnesium: 2.2 mg/dL (ref 1.7–2.4)

## 2019-03-07 LAB — URINE CULTURE: Culture: NO GROWTH

## 2019-03-07 LAB — HEMOGLOBIN A1C
Hgb A1c MFr Bld: 5.9 % — ABNORMAL HIGH (ref 4.8–5.6)
Mean Plasma Glucose: 122.63 mg/dL

## 2019-03-07 LAB — CBC
HCT: 32.5 % — ABNORMAL LOW (ref 36.0–46.0)
Hemoglobin: 10 g/dL — ABNORMAL LOW (ref 12.0–15.0)
MCH: 30 pg (ref 26.0–34.0)
MCHC: 30.8 g/dL (ref 30.0–36.0)
MCV: 97.6 fL (ref 80.0–100.0)
Platelets: 183 10*3/uL (ref 150–400)
RBC: 3.33 MIL/uL — ABNORMAL LOW (ref 3.87–5.11)
RDW: 14.7 % (ref 11.5–15.5)
WBC: 14.2 10*3/uL — ABNORMAL HIGH (ref 4.0–10.5)
nRBC: 0 % (ref 0.0–0.2)

## 2019-03-07 LAB — GLUCOSE, CAPILLARY
Glucose-Capillary: 165 mg/dL — ABNORMAL HIGH (ref 70–99)
Glucose-Capillary: 168 mg/dL — ABNORMAL HIGH (ref 70–99)
Glucose-Capillary: 154 mg/dL — ABNORMAL HIGH (ref 70–99)
Glucose-Capillary: 110 mg/dL — ABNORMAL HIGH (ref 70–99)
Glucose-Capillary: 223 mg/dL — ABNORMAL HIGH (ref 70–99)
Glucose-Capillary: 137 mg/dL — ABNORMAL HIGH (ref 70–99)

## 2019-03-07 LAB — ANTISTREPTOLYSIN O TITER: ASO: <20 IU/mL (ref 0.0–200.0)

## 2019-03-07 LAB — PHOSPHORUS: Phosphorus: 5.4 mg/dL — ABNORMAL HIGH (ref 2.5–4.6)

## 2019-03-07 LAB — PROTIME-INR
INR: 2 — ABNORMAL HIGH (ref 0.8–1.2)
Prothrombin Time: 22.3 seconds — ABNORMAL HIGH (ref 11.4–15.2)

## 2019-03-07 LAB — RHEUMATOID FACTOR: Rheumatoid fact SerPl-aCnc: 12.6 IU/mL (ref 0.0–13.9)

## 2019-03-07 MED ORDER — CHLORHEXIDINE GLUCONATE CLOTH 2 % EX PADS
6.0000 | MEDICATED_PAD | Freq: Once | CUTANEOUS | Status: AC
  Administered 2019-03-07: 6 via TOPICAL

## 2019-03-07 MED ORDER — FUROSEMIDE 10 MG/ML IJ SOLN
20.0000 mg | Freq: Once | INTRAMUSCULAR | Status: AC
  Administered 2019-03-07: 20 mg via INTRAVENOUS
  Filled 2019-03-07: qty 2

## 2019-03-07 MED ORDER — CHLORHEXIDINE GLUCONATE 0.12 % MT SOLN
15.0000 mL | Freq: Once | OROMUCOSAL | Status: AC
  Administered 2019-03-08: 15 mL via OROMUCOSAL
  Filled 2019-03-07: qty 15

## 2019-03-07 MED ORDER — ENOXAPARIN SODIUM 30 MG/0.3ML ~~LOC~~ SOLN: 30.0000 mg | SUBCUTANEOUS | Status: DC

## 2019-03-07 MED ORDER — IPRATROPIUM-ALBUTEROL 0.5-2.5 (3) MG/3ML IN SOLN
RESPIRATORY_TRACT | Status: AC
  Administered 2019-03-07: 3 mL via RESPIRATORY_TRACT
  Filled 2019-03-07: qty 3

## 2019-03-07 MED ORDER — PANTOPRAZOLE SODIUM 40 MG PO TBEC
40.0000 mg | DELAYED_RELEASE_TABLET | Freq: Every day | ORAL | Status: DC
  Administered 2019-03-07 – 2019-03-13 (×6): 40 mg via ORAL
  Filled 2019-03-07 (×6): qty 1

## 2019-03-07 MED ORDER — BISACODYL 5 MG PO TBEC
5.0000 mg | DELAYED_RELEASE_TABLET | Freq: Once | ORAL | Status: AC
  Administered 2019-03-07: 5 mg via ORAL
  Filled 2019-03-07: qty 1

## 2019-03-07 MED ORDER — AMIODARONE HCL IN DEXTROSE 360-4.14 MG/200ML-% IV SOLN
30.0000 mg/h | INTRAVENOUS | Status: DC
  Administered 2019-03-07 – 2019-03-10 (×7): 30 mg/h via INTRAVENOUS
  Filled 2019-03-07 (×7): qty 200

## 2019-03-07 MED ORDER — CHLORHEXIDINE GLUCONATE CLOTH 2 % EX PADS
6.0000 | MEDICATED_PAD | Freq: Once | CUTANEOUS | Status: AC
  Administered 2019-03-08: 6 via TOPICAL

## 2019-03-07 MED ORDER — IPRATROPIUM-ALBUTEROL 0.5-2.5 (3) MG/3ML IN SOLN
3.0000 mL | Freq: Four times a day (QID) | RESPIRATORY_TRACT | Status: DC | PRN
  Administered 2019-03-07 – 2019-03-12 (×6): 3 mL via RESPIRATORY_TRACT
  Filled 2019-03-07 (×5): qty 3

## 2019-03-07 MED ORDER — PHENYLEPHRINE HCL-NACL 10-0.9 MG/250ML-% IV SOLN
0.0000 ug/min | INTRAVENOUS | Status: DC
  Administered 2019-03-07: 20 ug/min via INTRAVENOUS
  Filled 2019-03-07: qty 250

## 2019-03-07 MED ORDER — AMIODARONE IV BOLUS ONLY 150 MG/100ML
150.0000 mg | Freq: Once | INTRAVENOUS | Status: AC
  Administered 2019-03-08: 150 mg via INTRAVENOUS

## 2019-03-07 MED ORDER — INSULIN ASPART 100 UNIT/ML ~~LOC~~ SOLN
0.0000 [IU] | SUBCUTANEOUS | Status: DC
  Administered 2019-03-07: 4 [IU] via SUBCUTANEOUS
  Administered 2019-03-07: 7 [IU] via SUBCUTANEOUS
  Administered 2019-03-07: 3 [IU] via SUBCUTANEOUS
  Administered 2019-03-07: 4 [IU] via SUBCUTANEOUS
  Administered 2019-03-08: 3 [IU] via SUBCUTANEOUS
  Administered 2019-03-08: 7 [IU] via SUBCUTANEOUS
  Administered 2019-03-08: 3 [IU] via SUBCUTANEOUS
  Administered 2019-03-09: 4 [IU] via SUBCUTANEOUS
  Administered 2019-03-09: 3 [IU] via SUBCUTANEOUS

## 2019-03-07 MED ORDER — AMIODARONE HCL IN DEXTROSE 360-4.14 MG/200ML-% IV SOLN
60.0000 mg/h | INTRAVENOUS | Status: AC
  Administered 2019-03-07 (×2): 60 mg/h via INTRAVENOUS
  Filled 2019-03-07 (×2): qty 200

## 2019-03-07 MED ORDER — METOPROLOL TARTRATE 12.5 MG HALF TABLET
12.5000 mg | ORAL_TABLET | Freq: Once | ORAL | Status: AC
  Administered 2019-03-08: 12.5 mg via ORAL
  Filled 2019-03-07: qty 1

## 2019-03-07 MED ORDER — TEMAZEPAM 15 MG PO CAPS
15.0000 mg | ORAL_CAPSULE | Freq: Once | ORAL | Status: AC | PRN
  Administered 2019-03-07: 15 mg via ORAL
  Filled 2019-03-07: qty 1

## 2019-03-07 MED ORDER — AMIODARONE LOAD VIA INFUSION
150.0000 mg | Freq: Once | INTRAVENOUS | Status: AC
  Administered 2019-03-07: 150 mg via INTRAVENOUS
  Filled 2019-03-07: qty 83.34

## 2019-03-07 NOTE — Progress Notes (Signed)
  Subjective: Patient feels better, fever resolved, off norepinephrine Heartburn symptoms have resolved Chest x-ray shows some interstitial edema and new left pleural effusion Repeat echo performed this a.m. reviewed-patient still has some external RV compression and a persistent 1-2 cm effusion in addition to the chronic epicardial fat pad.  She has had breakfast today. Because her clinical status has significantly improved I would recommend proceeding with a subxiphoid pericardial window under general anesthesia tomorrow to obtain tissue and fluid for culture and to provide permanent drainage.  We will keep patient n.p.o. after midnight and set up for surgery at 8 AM tomorrow.  Objective: Vital signs in last 24 hours: Temp:  [97.2 F (36.2 C)-99.3 F (37.4 C)] 98.2 F (36.8 C) (12/08 0839) Pulse Rate:  [77-163] 84 (12/08 0915) Cardiac Rhythm: Atrial fibrillation;Other (Comment) (12/08 0400) Resp:  [15-45] 23 (12/08 0915) BP: (57-142)/(34-86) 139/85 (12/08 0915) SpO2:  [3 %-98 %] 98 % (12/08 0915) Weight:  [74.6 kg] 74.6 kg (12/08 0500)  Hemodynamic parameters for last 24 hours:  heart rate controlled now on IV amiodarone  Intake/Output from previous day: 12/07 0701 - 12/08 0700 In: 1611 [I.V.:1510.9; IV Piggyback:100.1] Out: 475 [Urine:475] Intake/Output this shift: Total I/O In: 284 [I.V.:238.2; IV Piggyback:45.7] Out: 40 [Urine:40]  Exam  Alert and responsive No pericardial rub Lungs clear extrem warm Neuro intact  Lab Results: Recent Labs    03/06/19 0625 03/07/19 0419  WBC 18.4* 14.2*  HGB 13.0 10.0*  HCT 41.7 32.5*  PLT 224 183   BMET:  Recent Labs    03/06/19 2135 03/07/19 0419  NA 141 139  K 4.2 4.4  CL 106 107  CO2 22 20*  GLUCOSE 130* 241*  BUN 18 20  CREATININE 1.57* 1.49*  CALCIUM 8.2* 7.8*    PT/INR:  Recent Labs    03/06/19 0626  LABPROT 16.0*  INR 1.3*   ABG    Component Value Date/Time   PHART 7.441 12/09/2012 1026   HCO3  23.3 12/09/2012 1032   TCO2 25 12/09/2012 1032   ACIDBASEDEF 2.0 12/09/2012 1032   O2SAT 69.0 12/09/2012 1032   CBG (last 3)  Recent Labs    03/06/19 2329 03/07/19 0327 03/07/19 0808  GLUCAP 136* 165* 168*    Assessment/Plan: S/P Pericarditis with effusion, some RV compression Sepsis has significantly improved with fluid hydration and broad-spectrum antibiotics.  Cultures are not finalized.  White count has improved.  Blood pressure now stable off norepinephrine  We will proceed with subxiphoid pericardial window tomorrow as definitive therapy for this effusion which may wax and wane.  Hold eliquis for surgery   LOS: 1 day    Tharon Aquas Trigt III 03/07/2019

## 2019-03-07 NOTE — Anesthesia Preprocedure Evaluation (Addendum)
Anesthesia Evaluation  Patient identified by MRN, date of birth, ID band Patient awake    Reviewed: Allergy & Precautions, NPO status , Patient's Chart, lab work & pertinent test results  Airway Mallampati: III  TM Distance: >3 FB Neck ROM: Full    Dental  (+) Missing, Chipped,    Pulmonary neg pulmonary ROS,    Pulmonary exam normal breath sounds clear to auscultation       Cardiovascular hypertension, Pt. on medications + CAD and + Cardiac Stents  Normal cardiovascular exam+ dysrhythmias Atrial Fibrillation  Rhythm:Regular Rate:Normal  ECG: ST  ECHO: 1. There is a moderate pericardial effusion (up to 1.3 cm diameter near apex and along RV free wall) noted with fibrinous material adherent to the epicardium versus prominent epicardial adipose tissue. There is clear > 25% respirophasic variation of mitral E inflow velocity and respirophasic motion of the interventricular septum, suggesting ventricular interdependence. There does not appear to be definite RV diastolic collapse. RA indentation is noted. The IVC is dilated. This looks like a tamponade versus developing effusive/constrictive pericarditis picture. 2. Left ventricular ejection fraction, by visual estimation, is 60 to 65%. The left ventricle has normal function. There is no left ventricular hypertrophy. 3. Left ventricular diastolic parameters are consistent with Grade II diastolic dysfunction (pseudonormalization). 4. The left ventricle has no regional wall motion abnormalities 5. Global right ventricle has normal systolic function.The right ventricular size is normal. No increase in right ventricular wall thickness. 6. Right atrial size was mildly dilated. 7. Mildly dilated left atrium. 8. Mild mitral annular calcification. 9. The mitral valve is normal in structure. No evidence of mitral valve regurgitation. 10. The tricuspid valve is normal in structure. Tricuspid  valve regurgitation is trivial. 11. The aortic valve is tricuspid. Aortic valve regurgitation is not visualized. No evidence of aortic valve sclerosis or stenosis. 12. The inferior vena cava is dilated in size with <50% respiratory variability, suggesting right atrial pressure of 15 mmHg. 13. The tricuspid regurgitant velocity is 1.91 m/s, and with an assumed right atrial pressure of 3 mmHg, the estimated right ventricular systolic pressure is normal at 17.6 mmHg.   Neuro/Psych  Headaches, PSYCHIATRIC DISORDERS Anxiety Depression Temporal arteritis     GI/Hepatic Neg liver ROS, GERD  ,  Endo/Other  negative endocrine ROS  Renal/GU Renal disease     Musculoskeletal  (+) Arthritis , Rheumatoid disorders,  Scoliosis Chronic lower back pain   Abdominal   Peds  Hematology  (+) anemia , HLD   Anesthesia Other Findings PERICARDIAL EFFUSION  Reproductive/Obstetrics                            Anesthesia Physical Anesthesia Plan  ASA: IV  Anesthesia Plan: General   Post-op Pain Management:    Induction: Intravenous  PONV Risk Score and Plan: 3 and Ondansetron, Dexamethasone and Treatment may vary due to age or medical condition  Airway Management Planned: Oral ETT  Additional Equipment: Arterial line, CVP, Ultrasound Guidance Line Placement and TEE  Intra-op Plan:   Post-operative Plan: Possible Post-op intubation/ventilation  Informed Consent: I have reviewed the patients History and Physical, chart, labs and discussed the procedure including the risks, benefits and alternatives for the proposed anesthesia with the patient or authorized representative who has indicated his/her understanding and acceptance.   Patient has DNR.  Discussed DNR with patient and Suspend DNR.   Dental advisory given  Plan Discussed with: CRNA  Anesthesia Plan Comments: (TEE  for intraoperative monitoring only)       Anesthesia Quick Evaluation

## 2019-03-07 NOTE — Progress Notes (Signed)
eLink Physician-Brief Progress Note Patient Name: Betty Jackson DOB: [birth year] MRN: [id number]   Date of Service  03/07/2019  HPI/Events of Note  AFIB with RVR - EKG did not populate into Epic. HR = 157. BP = 81/61 on Norepinephrine and Cardizem IV infusions.   eICU Interventions  Will order: 1. Amiodarone IV load and infusion. 2. Wean Cardizem IV infusion off.  3. Phenylephrine IV infusion. 4. Wean Norepinephrine IV infusion off.  5. Repeat 12 Lead EKG now.      Intervention Category Major Interventions: Arrhythmia - evaluation and management  Sommer,Steven Eugene 03/07/2019, 1:33 AM

## 2019-03-07 NOTE — Progress Notes (Signed)
  Echocardiogram 2D Echocardiogram has been performed.  Betty Jackson 03/07/2019, 9:58 AM

## 2019-03-07 NOTE — Progress Notes (Addendum)
_   NAME:  Betty Jackson, MRN:  [id number], DOB:  12/22/1934, LOS: 1 ADMISSION DATE:  03/06/2019, CONSULTATION DATE:  03/06/19 REFERRING MD:  Wilson Singer  CHIEF COMPLAINT:  Chest discomfort   Brief History   Betty Jackson is a 84 y.o. female who was admitted 12/7 with undifferentiated shock, presumed septic of unclear etiology +/- cardiogenic.  Has small pericardial effusion and known epicardial fat pad.  Evaluated by TCTS who recommends repeat echo in 24 hours.  History of present illness    Betty Jackson is a 84 y.o. female who has a PMH as outlined below  She presented to [facility] ED 12/7 with pain in mid sternal / chest area and radiating into epigastrium x 3 days.  Felt as if she couldn't swallow.  Associated with nausea and vomiting and feels pain somewhat improved with vomiting but not fully.  She was initially treated with GI cocktail.  Was also found to have borderline hypotension so was started on fluids.  CT A / P was read as pericarditis with mod to large pericardial effusion.  Incidentally also found to have subacute left ischial tuberosity avulsion fx with stable subacute to chronic right sacral ala insufficiency fx.   Given hypotension, there was concern for tamponade physiology; therefore, STAT echo was obtained.  This confirmed effusion; however, also showed fat pad which was seen on prior imaging.  EF 70-75% with rapid ventricular rate.   She was evaluated urgently by CVTS; however, was felt to NOT have significant pericardial effusion and no tamponade.  Rather, CVTS notes slight effusion in addition to the known fat pad.  They are recommending repeat echo in 24 hours.  Unfortunately, she had significant hypotension in ED; therefore, had CVL placed and was started on norepinephrine.  At the time of CVTS eval, she was febrile to 103 and was on 53mcg/min of NE.  Due to above, she was transferred to [facility] cardiac ICU.  Of note, she had recent admission 01/13/19 through 01/20/19.  She was discharged  on cipro + flagyl to complete full abx course.  Past Medical History  has Temporal arteritis ([place]); Hypertension; Hyperlipidemia; History of atrial fibrillation; CAD (coronary artery disease); GERD (gastroesophageal reflux disease); Insomnia; Leg pain, right; Enteritis; Abdominal pain, other specified site; Hyponatremia; Hematuria; Nonspecific (abnormal) findings on radiological and other examination of gastrointestinal tract; Visual changes; Depression; Anxiety state, unspecified; Anemia, unspecified; CVA (cerebral infarction); Chronic insomnia; Elevated transaminase level; Lumbar stenosis; Abdominal pain, epigastric; Major depressive disorder, recurrent episode, moderate ([place]); Herniated nucleus pulposus, L5-S1, right; Osteoporosis; Influenza A; Sepsis ([place]); Acute respiratory failure with hypoxia (HCC); CKD (chronic kidney disease) stage 3, GFR 30-59 ml/min; PAF (paroxysmal atrial fibrillation) ([place]); HTN (hypertension); Giant cell arteritis ([place]); Coronary disease; Abnormal urinalysis; Chronic low back pain; Acute diverticulitis; AKI (acute kidney injury) ([place]); Chronic pain; Tachycardia; Hypokalemia; Nausea; Atrial fibrillation with rapid ventricular response ([place]); Pericarditis; Fever; Septic shock ([place]); and Atypical chest pain on their problem list.  Significant Hospital Events   12/7 > admit.  Consults:  TCTS.  Procedures:  None.  Significant Diagnostic Tests:  CT A / P 12/7 > ? Pericarditis with mod to large pericardial effusion.  Subacute left ischial tuberosity avulsion fx.  Stable subacute to chronic right sacral ala insufficiency fx.  Chronic biliary dilatation post cholecystectomy. Echo 12/7 > EF 70-75%, mod pericardial effusion, presence of pericardial fat pad.  Micro Data:  Blood 12/7 > neg. Sputum 12/7 >  Urine 12/7 >  SARS CoV2 12/7 >  neg. Flu A/B neg  Antimicrobials:  Cefepime12/6  Metronidazole 12/6 Zosyn 12/7 >   Vancomycin 12/7>  Interim history/subjective:  AF  RVR overnight. Given amiodarone bolus and infusion. Now in SR rate 80s. Switched from levophed to neosynephrine, on 32mcg/min.  C/O L hip pain. Denies CP or SOB. States she slept well. Requesting hydrocodone.   Objective:  Blood pressure 120/71, pulse 90, temperature (!) 97.2 F (36.2 C), temperature source Axillary, resp. rate (!) 21, height 5' 4.5" (1.638 m), weight 74.6 kg, SpO2 94 %.        Intake/Output Summary (Last 24 hours) at 03/07/2019 0729 Last data filed at 03/07/2019 0500 Gross per 24 hour  Intake 1610.96 ml  Output 475 ml  Net 1135.96 ml   Filed Weights   03/06/19 0550 03/07/19 0500  Weight: 72.6 kg 74.6 kg    Examination: General: Elderly, well-developed, well nourished. NAD HENT: Normocephalic, PERRL. Moist mucus membranes Neck: No JVD. Trachea midline.  CV: RRR. Slightly muffled S1S2. No MRG. +2 distal pulses Lungs: BBS present, clear, FNL, symmetrical ABD: +BS x4. SNT/ND. No masses, guarding or rigidity GU: Foley EXT: MAE well. Trace edema to lower EXT  Skin: Pale, WD. In tact. No rashes or lesions Neuro: A&Ox3. CN II-XII in tact. No focal deficits Psych: Appropriate mood, insight and judgment for time and situation    CXR B/L lower opacities, pericardial effusion  Antistreptolysin O titer <20 RA Latex 12.6 Assessment & Plan:   Shock - unclear etiology at this point.  Presumed septic + / - cardiogenic.  Also consider adrenal insufficiency / crisis (on chronic pred). Improving.  - wean neosynpehrine as needed for goal MAP > 65. - Continue empiric stress steroids. - Continue empiric vanc / zosyn for now. - Follow cultures. - am lactic acid pending   Pericardial effusion - read as mod - large on imaging but reviewed by TCTS who feels it is small and NOT concerning for tamponade. - coxsackie, ANA, Quant gold pending  - antistreptolysin O, RF WNL. - Repeat echo 12/8 pending.   AKI-mild -maintain MAP >/=65 -avoid nephrotoxins -I/O   Steroid induced  hyperglycemia -SSI    Hx PAF, HTN, HLD. AF RVR overnight, converted with Amiodarone and switching to neosynephrine  - Continue amiodarone infusion for now  -goal K+ 4 (4.4 today), goal Mag 2 (2.2 today) - Continue Lovenox in lieu of home eliquis for now . - Continue to Hold home diltiazem, eliquis, furosemide, losartan, lopressor, rosuvastatin for now pending echo results and wean from pressors   Hx temporal arteritis (on chronic prednisone 10mg  daily). - Stress steroids as above.  Hx diverticulitis (recent admission 01/13/19 through 01/20/19, treated with cipro / flagyl).  CT this admit without suggestion of recurrence. - Monitor.  Hx DDD, CVA, depression. - Hold home escitalopram, norco.   Best Practice:  Diet: Regular  Pain/Anxiety/Delirium protocol (if indicated): N/A. VAP protocol (if indicated): In place. N/A. DVT prophylaxis: SCD's / Lovenox. GI prophylaxis: PPI  Glucose control: SSI. Mobility: Bedrest. Code Status: DNR. Family Communication: will update  Disposition: continue ICU.  Labs   CBC: Recent Labs  Lab 03/06/19 0625 03/07/19 0419  WBC 18.4* 14.2*  NEUTROABS 13.1*  --   HGB 13.0 10.0*  HCT 41.7 32.5*  MCV 93.9 97.6  PLT 224 [id number]   Basic Metabolic Panel: Recent Labs  Lab 03/06/19 0625 03/06/19 2135 03/07/19 0419  NA 140 141 139  K 3.9 4.2 4.4  CL 101 106 107  CO2 24 22  20*  GLUCOSE 153* 130* 241*  BUN 17 18 20   CREATININE 0.96 1.57* 1.49*  CALCIUM 9.0 8.2* 7.8*  MG  --  1.6* 2.2  PHOS  --   --  5.4*   GFR: Estimated Creatinine Clearance: 29.1 mL/min (A) (by C-G formula based on SCr of 1.49 mg/dL (H)). Recent Labs  Lab 03/06/19 0625 03/06/19 0626 03/06/19 0826 03/06/19 1842 03/06/19 2200 03/07/19 0419  PROCALCITON  --   --   --  18.12  --   --   WBC 18.4*  --   --   --   --  14.2*  LATICACIDVEN  --  2.3* 3.8*  --  3.0*  --    lation Profile: Recent Labs  Lab 03/06/19 0626  INR 1.3*   CBG: Recent Labs  Lab 03/06/19 1929  03/06/19 2329 03/07/19 0327  GLUCAP 104* 136* 165*     The patient is critically ill with shock. She requires ICU for high complexity decision making, titration of high alert medications, ventilator management, titration of oxygen and interpretation of advanced monitoring.    I personally spent 35 minutes providing critical care services including personally reviewing test results, discussing care with nursing staff/other physicians and completing orders pertaining to this patient.  Time was exclusive to the patient and does not include time spent teaching or in procedures.  Voice recognition software was used in the production of this record.  Errors in interpretation may have been inadvertently missed during review.  Francine Graven, MSN, AGACNP  [facility]

## 2019-03-08 ENCOUNTER — Inpatient Hospital Stay (HOSPITAL_COMMUNITY): Payer: Medicare Other

## 2019-03-08 ENCOUNTER — Encounter (HOSPITAL_COMMUNITY)
Admission: EM | Disposition: A | Discharge status: Home Health | Payer: Self-pay | Source: Home / Self Care | Attending: Internal Medicine

## 2019-03-08 ENCOUNTER — Encounter (HOSPITAL_COMMUNITY): Payer: Self-pay | Admitting: Certified Registered"

## 2019-03-08 ENCOUNTER — Inpatient Hospital Stay (HOSPITAL_COMMUNITY): Payer: Medicare Other | Admitting: Certified Registered"

## 2019-03-08 ENCOUNTER — Telehealth: Payer: Self-pay | Admitting: Registered Nurse

## 2019-03-08 HISTORY — PX: TEE WITHOUT CARDIOVERSION: SHX5443

## 2019-03-08 HISTORY — PX: CHEST TUBE INSERTION: SHX231

## 2019-03-08 HISTORY — PX: SUBXYPHOID PERICARDIAL WINDOW: SHX5075

## 2019-03-08 LAB — ENA+DNA/DS+ANTICH+CENTRO+JO...
Anti JO-1: <0.2 AI (ref 0.0–0.9)
Centromere Ab Screen: <0.2 AI (ref 0.0–0.9)
Chromatin Ab SerPl-aCnc: <0.2 AI (ref 0.0–0.9)
ENA SM Ab Ser-aCnc: <0.2 AI (ref 0.0–0.9)
Ribonucleic Protein: <0.2 AI (ref 0.0–0.9)
SSA (Ro) (ENA) Antibody, IgG: <0.2 AI (ref 0.0–0.9)
SSB (La) (ENA) Antibody, IgG: <0.2 AI (ref 0.0–0.9)
Scleroderma (Scl-70) (ENA) Antibody, IgG: <0.2 AI (ref 0.0–0.9)
ds DNA Ab: 24 IU/mL — ABNORMAL HIGH (ref 0–9)

## 2019-03-08 LAB — ECHO INTRAOPERATIVE TEE
Height: 64.5 in
Weight: 2684.32 oz

## 2019-03-08 LAB — CBC
HCT: 26.3 % — ABNORMAL LOW (ref 36.0–46.0)
Hemoglobin: 8.7 g/dL — ABNORMAL LOW (ref 12.0–15.0)
MCH: 30.3 pg (ref 26.0–34.0)
MCHC: 33.1 g/dL (ref 30.0–36.0)
MCV: 91.6 fL (ref 80.0–100.0)
Platelets: 149 10*3/uL — ABNORMAL LOW (ref 150–400)
RBC: 2.87 MIL/uL — ABNORMAL LOW (ref 3.87–5.11)
RDW: 14.8 % (ref 11.5–15.5)
WBC: 12.8 10*3/uL — ABNORMAL HIGH (ref 4.0–10.5)
nRBC: 0 % (ref 0.0–0.2)

## 2019-03-08 LAB — BASIC METABOLIC PANEL
Anion gap: 14 (ref 5–15)
BUN: 23 mg/dL (ref 8–23)
CO2: 21 mmol/L — ABNORMAL LOW (ref 22–32)
Calcium: 8.2 mg/dL — ABNORMAL LOW (ref 8.9–10.3)
Chloride: 104 mmol/L (ref 98–111)
Creatinine, Ser: 1.59 mg/dL — ABNORMAL HIGH (ref 0.44–1.00)
GFR calc Af Amer: 35 mL/min — ABNORMAL LOW (ref >60)
GFR calc non Af Amer: 30 mL/min — ABNORMAL LOW (ref >60)
Glucose, Bld: 129 mg/dL — ABNORMAL HIGH (ref 70–99)
Potassium: 3.2 mmol/L — ABNORMAL LOW (ref 3.5–5.1)
Sodium: 139 mmol/L (ref 135–145)

## 2019-03-08 LAB — COXSACKIE B VIRUS ANTIBODIES
Coxsackie B1 Ab: 1:8 {titer} — ABNORMAL HIGH
Coxsackie B2 Ab: 1:8 {titer} — ABNORMAL HIGH
Coxsackie B3 Ab: NEGATIVE
Coxsackie B4 Ab: 1:8 {titer} — ABNORMAL HIGH
Coxsackie B5 Ab: NEGATIVE
Coxsackie B6 Ab: NEGATIVE

## 2019-03-08 LAB — POCT I-STAT 7, (LYTES, BLD GAS, ICA,H+H)
Acid-base deficit: 3 mmol/L — ABNORMAL HIGH (ref 0.0–2.0)
Bicarbonate: 21.4 mmol/L (ref 20.0–28.0)
Calcium, Ion: 1.18 mmol/L (ref 1.15–1.40)
HCT: 28 % — ABNORMAL LOW (ref 36.0–46.0)
Hemoglobin: 9.5 g/dL — ABNORMAL LOW (ref 12.0–15.0)
O2 Saturation: 98 %
Patient temperature: 98.3
Potassium: 3.9 mmol/L (ref 3.5–5.1)
Sodium: 136 mmol/L (ref 135–145)
TCO2: 22 mmol/L (ref 22–32)
pCO2 arterial: 33.6 mmHg (ref 32.0–48.0)
pH, Arterial: 7.412 (ref 7.350–7.450)
pO2, Arterial: 98 mmHg (ref 83.0–108.0)

## 2019-03-08 LAB — QUANTIFERON-TB GOLD PLUS (RQFGPL)
QuantiFERON Mitogen Value: 5.98 IU/mL
QuantiFERON Nil Value: 0.03 IU/mL
QuantiFERON TB1 Ag Value: 0.03 IU/mL
QuantiFERON TB2 Ag Value: 0.04 IU/mL

## 2019-03-08 LAB — GLUCOSE, CAPILLARY
Glucose-Capillary: 105 mg/dL — ABNORMAL HIGH (ref 70–99)
Glucose-Capillary: 118 mg/dL — ABNORMAL HIGH (ref 70–99)
Glucose-Capillary: 125 mg/dL — ABNORMAL HIGH (ref 70–99)
Glucose-Capillary: 132 mg/dL — ABNORMAL HIGH (ref 70–99)
Glucose-Capillary: 220 mg/dL — ABNORMAL HIGH (ref 70–99)

## 2019-03-08 LAB — QUANTIFERON-TB GOLD PLUS: QuantiFERON-TB Gold Plus: NEGATIVE

## 2019-03-08 LAB — LACTIC ACID, PLASMA: Lactic Acid, Venous: 2.1 mmol/L (ref 0.5–1.9)

## 2019-03-08 LAB — MAGNESIUM: Magnesium: 2 mg/dL (ref 1.7–2.4)

## 2019-03-08 LAB — PREPARE RBC (CROSSMATCH)

## 2019-03-08 LAB — ANA W/REFLEX IF POSITIVE: Anti Nuclear Antibody (ANA): POSITIVE — AB

## 2019-03-08 SURGERY — CREATION, PERICARDIAL WINDOW, SUBXIPHOID APPROACH
Anesthesia: General

## 2019-03-08 MED ORDER — FENTANYL CITRATE (PF) 100 MCG/2ML IJ SOLN
INTRAMUSCULAR | Status: AC
  Administered 2019-03-08: 50 ug via INTRAVENOUS
  Filled 2019-03-08: qty 2

## 2019-03-08 MED ORDER — SUGAMMADEX SODIUM 200 MG/2ML IV SOLN
INTRAVENOUS | Status: DC | PRN
  Administered 2019-03-08: 200 mg via INTRAVENOUS

## 2019-03-08 MED ORDER — SODIUM CHLORIDE (PF) 0.9 % IJ SOLN
OROMUCOSAL | Status: DC | PRN
  Administered 2019-03-08 (×2): 4 mL via TOPICAL

## 2019-03-08 MED ORDER — SODIUM CHLORIDE 0.9% IV SOLUTION: Freq: Once | INTRAVENOUS | Status: DC

## 2019-03-08 MED ORDER — SODIUM CHLORIDE 0.9 % IV SOLN
INTRAVENOUS | Status: DC | PRN
  Administered 2019-03-08: 10:00:00 via INTRAVENOUS

## 2019-03-08 MED ORDER — ACETAMINOPHEN 160 MG/5ML PO SOLN: 1000.0000 mg | Freq: Four times a day (QID) | ORAL | Status: AC

## 2019-03-08 MED ORDER — BISACODYL 5 MG PO TBEC
10.0000 mg | DELAYED_RELEASE_TABLET | Freq: Every day | ORAL | Status: DC
  Filled 2019-03-08: qty 2

## 2019-03-08 MED ORDER — HYDROCORTISONE NA SUCCINATE PF 100 MG IJ SOLR
50.0000 mg | Freq: Every day | INTRAMUSCULAR | Status: DC
  Administered 2019-03-09: 50 mg via INTRAVENOUS
  Filled 2019-03-08: qty 2

## 2019-03-08 MED ORDER — FENTANYL CITRATE (PF) 100 MCG/2ML IJ SOLN
12.5000 ug | INTRAMUSCULAR | Status: DC | PRN
  Administered 2019-03-08 – 2019-03-09 (×5): 12.5 ug via INTRAVENOUS
  Filled 2019-03-08 (×5): qty 2

## 2019-03-08 MED ORDER — ONDANSETRON HCL 4 MG/2ML IJ SOLN
INTRAMUSCULAR | Status: DC | PRN
  Administered 2019-03-08: 4 mg via INTRAVENOUS

## 2019-03-08 MED ORDER — PHENYLEPHRINE 40 MCG/ML (10ML) SYRINGE FOR IV PUSH (FOR BLOOD PRESSURE SUPPORT)
PREFILLED_SYRINGE | INTRAVENOUS | Status: DC | PRN
  Administered 2019-03-08 (×2): 80 ug via INTRAVENOUS

## 2019-03-08 MED ORDER — LACTATED RINGERS IV SOLN
INTRAVENOUS | Status: DC | PRN
  Administered 2019-03-08: 08:00:00 via INTRAVENOUS

## 2019-03-08 MED ORDER — SODIUM CHLORIDE 0.45 % IV SOLN
INTRAVENOUS | Status: DC
  Administered 2019-03-08 – 2019-03-11 (×4): via INTRAVENOUS

## 2019-03-08 MED ORDER — ROCURONIUM BROMIDE 10 MG/ML (PF) SYRINGE
PREFILLED_SYRINGE | INTRAVENOUS | Status: AC
  Filled 2019-03-08: qty 10

## 2019-03-08 MED ORDER — ONDANSETRON HCL 4 MG/2ML IJ SOLN: 4.0000 mg | Freq: Once | INTRAMUSCULAR | Status: DC | PRN

## 2019-03-08 MED ORDER — METOCLOPRAMIDE HCL 5 MG/ML IJ SOLN
10.0000 mg | Freq: Four times a day (QID) | INTRAMUSCULAR | Status: AC
  Administered 2019-03-08 (×3): 10 mg via INTRAVENOUS
  Filled 2019-03-08 (×3): qty 2

## 2019-03-08 MED ORDER — ACETAMINOPHEN 500 MG PO TABS
1000.0000 mg | ORAL_TABLET | Freq: Four times a day (QID) | ORAL | Status: AC
  Administered 2019-03-08 – 2019-03-11 (×10): 1000 mg via ORAL
  Administered 2019-03-11: 500 mg via ORAL
  Filled 2019-03-08 (×11): qty 2

## 2019-03-08 MED ORDER — ROSUVASTATIN CALCIUM 5 MG PO TABS
10.0000 mg | ORAL_TABLET | Freq: Every day | ORAL | Status: DC
  Administered 2019-03-08 – 2019-03-12 (×5): 10 mg via ORAL
  Filled 2019-03-08 (×5): qty 2

## 2019-03-08 MED ORDER — LIDOCAINE 2% (20 MG/ML) 5 ML SYRINGE
INTRAMUSCULAR | Status: AC
  Filled 2019-03-08: qty 5

## 2019-03-08 MED ORDER — FENTANYL CITRATE (PF) 100 MCG/2ML IJ SOLN
25.0000 ug | INTRAMUSCULAR | Status: DC | PRN
  Administered 2019-03-08: 50 ug via INTRAVENOUS

## 2019-03-08 MED ORDER — DEXAMETHASONE SODIUM PHOSPHATE 10 MG/ML IJ SOLN
INTRAMUSCULAR | Status: DC | PRN
  Administered 2019-03-08: 4 mg via INTRAVENOUS

## 2019-03-08 MED ORDER — PANTOPRAZOLE SODIUM 40 MG PO TBEC: 40.0000 mg | DELAYED_RELEASE_TABLET | Freq: Every day | ORAL | Status: DC

## 2019-03-08 MED ORDER — HEMOSTATIC AGENTS (NO CHARGE) OPTIME
TOPICAL | Status: DC | PRN
  Administered 2019-03-08 (×2): 1 via TOPICAL

## 2019-03-08 MED ORDER — ONDANSETRON HCL 4 MG/2ML IJ SOLN: 4.0000 mg | Freq: Four times a day (QID) | INTRAMUSCULAR | Status: DC | PRN

## 2019-03-08 MED ORDER — MIDAZOLAM HCL 5 MG/5ML IJ SOLN
INTRAMUSCULAR | Status: DC | PRN
  Administered 2019-03-08: 1 mg via INTRAVENOUS

## 2019-03-08 MED ORDER — CIPROFLOXACIN HCL 500 MG PO TABS
500.0000 mg | ORAL_TABLET | Freq: Every evening | ORAL | Status: DC
  Filled 2019-03-08: qty 1

## 2019-03-08 MED ORDER — PHENYLEPHRINE 40 MCG/ML (10ML) SYRINGE FOR IV PUSH (FOR BLOOD PRESSURE SUPPORT)
PREFILLED_SYRINGE | INTRAVENOUS | Status: AC
  Filled 2019-03-08: qty 10

## 2019-03-08 MED ORDER — SENNOSIDES-DOCUSATE SODIUM 8.6-50 MG PO TABS
1.0000 | ORAL_TABLET | Freq: Every day | ORAL | Status: DC
  Administered 2019-03-08 – 2019-03-09 (×2): 1 via ORAL
  Filled 2019-03-08 (×5): qty 1

## 2019-03-08 MED ORDER — ETOMIDATE 2 MG/ML IV SOLN
INTRAVENOUS | Status: DC | PRN
  Administered 2019-03-08: 12 mg via INTRAVENOUS

## 2019-03-08 MED ORDER — SODIUM CHLORIDE 0.9 % IV SOLN: INTRAVENOUS | Status: DC | PRN

## 2019-03-08 MED ORDER — DEXAMETHASONE SODIUM PHOSPHATE 10 MG/ML IJ SOLN
INTRAMUSCULAR | Status: AC
  Filled 2019-03-08: qty 1

## 2019-03-08 MED ORDER — DILTIAZEM HCL 60 MG PO TABS
60.0000 mg | ORAL_TABLET | Freq: Three times a day (TID) | ORAL | Status: DC
  Administered 2019-03-08 – 2019-03-13 (×15): 60 mg via ORAL
  Filled 2019-03-08 (×15): qty 1

## 2019-03-08 MED ORDER — CHLORHEXIDINE GLUCONATE CLOTH 2 % EX PADS
6.0000 | MEDICATED_PAD | Freq: Every day | CUTANEOUS | Status: DC
  Administered 2019-03-09 – 2019-03-13 (×4): 6 via TOPICAL

## 2019-03-08 MED ORDER — FENTANYL CITRATE (PF) 100 MCG/2ML IJ SOLN
INTRAMUSCULAR | Status: DC | PRN
  Administered 2019-03-08 (×2): 50 ug via INTRAVENOUS
  Administered 2019-03-08: 100 ug via INTRAVENOUS
  Administered 2019-03-08: 50 ug via INTRAVENOUS
  Administered 2019-03-08: 150 ug via INTRAVENOUS

## 2019-03-08 MED ORDER — ESCITALOPRAM OXALATE 10 MG PO TABS
10.0000 mg | ORAL_TABLET | Freq: Every day | ORAL | Status: DC
  Administered 2019-03-08 – 2019-03-13 (×6): 10 mg via ORAL
  Filled 2019-03-08 (×6): qty 1

## 2019-03-08 MED ORDER — FENTANYL CITRATE (PF) 250 MCG/5ML IJ SOLN
INTRAMUSCULAR | Status: AC
  Filled 2019-03-08: qty 5

## 2019-03-08 MED ORDER — TRAMADOL HCL 50 MG PO TABS: 50.0000 mg | ORAL_TABLET | Freq: Two times a day (BID) | ORAL | Status: DC | PRN

## 2019-03-08 MED ORDER — MIDAZOLAM HCL 2 MG/2ML IJ SOLN
INTRAMUSCULAR | Status: AC
  Filled 2019-03-08: qty 2

## 2019-03-08 MED ORDER — POTASSIUM CHLORIDE 10 MEQ/50ML IV SOLN
10.0000 meq | INTRAVENOUS | Status: DC
  Administered 2019-03-08 (×2): 10 meq via INTRAVENOUS
  Filled 2019-03-08: qty 50

## 2019-03-08 MED ORDER — ONDANSETRON HCL 4 MG/2ML IJ SOLN
INTRAMUSCULAR | Status: AC
  Filled 2019-03-08: qty 2

## 2019-03-08 MED ORDER — ROCURONIUM BROMIDE 50 MG/5ML IV SOSY
PREFILLED_SYRINGE | INTRAVENOUS | Status: DC | PRN
  Administered 2019-03-08 (×2): 50 mg via INTRAVENOUS

## 2019-03-08 MED ORDER — POTASSIUM CHLORIDE CRYS ER 20 MEQ PO TBCR
20.0000 meq | EXTENDED_RELEASE_TABLET | Freq: Every day | ORAL | Status: DC
  Administered 2019-03-08 – 2019-03-13 (×6): 20 meq via ORAL
  Filled 2019-03-08 (×6): qty 1

## 2019-03-08 MED ORDER — PROPOFOL 10 MG/ML IV BOLUS
INTRAVENOUS | Status: DC | PRN
  Administered 2019-03-08: 50 mg via INTRAVENOUS

## 2019-03-08 MED ORDER — PROPOFOL 10 MG/ML IV BOLUS
INTRAVENOUS | Status: AC
  Filled 2019-03-08: qty 20

## 2019-03-08 MED ORDER — SUCCINYLCHOLINE CHLORIDE 200 MG/10ML IV SOSY
PREFILLED_SYRINGE | INTRAVENOUS | Status: AC
  Filled 2019-03-08: qty 10

## 2019-03-08 MED ORDER — 0.9 % SODIUM CHLORIDE (POUR BTL) OPTIME
TOPICAL | Status: DC | PRN
  Administered 2019-03-08: 3000 mL

## 2019-03-08 MED ORDER — PHENYLEPHRINE HCL-NACL 10-0.9 MG/250ML-% IV SOLN
INTRAVENOUS | Status: DC | PRN
  Administered 2019-03-08: 60 ug/min via INTRAVENOUS

## 2019-03-08 SURGICAL SUPPLY — 62 items
BENZOIN TINCTURE PRP APPL 2/3 (GAUZE/BANDAGES/DRESSINGS) IMPLANT
BLADE CLIPPER SURG (BLADE) IMPLANT
BLADE STERNUM SYSTEM 6 (BLADE) ×4 IMPLANT
CANISTER SUCT 3000ML PPV (MISCELLANEOUS) ×4 IMPLANT
CATH THORACIC 28FR (CATHETERS) IMPLANT
CATH THORACIC 28FR RT ANG (CATHETERS) IMPLANT
CATH THORACIC 36FR (CATHETERS) IMPLANT
CATH THORACIC 36FR RT ANG (CATHETERS) IMPLANT
CLIP VESOCCLUDE MED 24/CT (CLIP) ×4 IMPLANT
CLIP VESOCCLUDE SM WIDE 24/CT (CLIP) ×4 IMPLANT
CLOSURE WOUND 1/2 X4 (GAUZE/BANDAGES/DRESSINGS)
CONN ST 1/4X3/8  BEN (MISCELLANEOUS) ×2
CONN ST 1/4X3/8 BEN (MISCELLANEOUS) ×2 IMPLANT
CONT SPEC 4OZ CLIKSEAL STRL BL (MISCELLANEOUS) ×16 IMPLANT
COVER SURGICAL LIGHT HANDLE (MISCELLANEOUS) ×4 IMPLANT
COVER WAND RF STERILE (DRAPES) IMPLANT
DRAIN CHANNEL 19F RND (DRAIN) ×4 IMPLANT
DRAIN CHANNEL 28F RND 3/8 FF (WOUND CARE) ×4 IMPLANT
DRAIN CONNECTOR BLAKE 1:1 (MISCELLANEOUS) ×4 IMPLANT
DRAPE CARDIOVASCULAR INCISE (DRAPES) ×2
DRAPE HALF SHEET 40X57 (DRAPES) IMPLANT
DRAPE LAPAROSCOPIC ABDOMINAL (DRAPES) IMPLANT
DRAPE SRG 135X102X78XABS (DRAPES) ×2 IMPLANT
DRSG AQUACEL AG ADV 3.5X 6 (GAUZE/BANDAGES/DRESSINGS) ×4 IMPLANT
ELECT BLADE 4.0 EZ CLEAN MEGAD (MISCELLANEOUS) ×4
ELECT REM PT RETURN 9FT ADLT (ELECTROSURGICAL) ×4
ELECTRODE BLDE 4.0 EZ CLN MEGD (MISCELLANEOUS) ×2 IMPLANT
ELECTRODE REM PT RTRN 9FT ADLT (ELECTROSURGICAL) ×2 IMPLANT
GAUZE SPONGE 4X4 12PLY STRL (GAUZE/BANDAGES/DRESSINGS) ×4 IMPLANT
GLOVE BIO SURGEON STRL SZ7.5 (GLOVE) ×8 IMPLANT
HEMOSTAT POWDER SURGIFOAM 1G (HEMOSTASIS) ×8 IMPLANT
HEMOSTAT SURGICEL 2X14 (HEMOSTASIS) ×4 IMPLANT
KIT BASIN OR (CUSTOM PROCEDURE TRAY) ×4 IMPLANT
KIT TURNOVER KIT B (KITS) ×4 IMPLANT
NS IRRIG 1000ML POUR BTL (IV SOLUTION) ×4 IMPLANT
PACK CHEST (CUSTOM PROCEDURE TRAY) ×4 IMPLANT
PAD ARMBOARD 7.5X6 YLW CONV (MISCELLANEOUS) ×8 IMPLANT
PAD ELECT DEFIB RADIOL ZOLL (MISCELLANEOUS) ×4 IMPLANT
POWDER SURGICEL 3.0 GRAM (HEMOSTASIS) ×4 IMPLANT
STRIP CLOSURE SKIN 1/2X4 (GAUZE/BANDAGES/DRESSINGS) IMPLANT
SUT PROLENE 2 0 MH 48 (SUTURE) ×8 IMPLANT
SUT SILK  1 MH (SUTURE) ×4
SUT SILK 1 MH (SUTURE) ×4 IMPLANT
SUT SILK 2 0 SH CR/8 (SUTURE) ×4 IMPLANT
SUT STEEL 6MS V (SUTURE) ×4 IMPLANT
SUT VIC AB 1 CTX 18 (SUTURE) ×4 IMPLANT
SUT VIC AB 1 CTX 36 (SUTURE)
SUT VIC AB 1 CTX36XBRD ANBCTR (SUTURE) IMPLANT
SUT VIC AB 2-0 CT1 27 (SUTURE) ×2
SUT VIC AB 2-0 CT1 TAPERPNT 27 (SUTURE) ×2 IMPLANT
SUT VIC AB 3-0 X1 27 (SUTURE) ×4 IMPLANT
SWAB COLLECTION DEVICE MRSA (MISCELLANEOUS) IMPLANT
SWAB CULTURE ESWAB REG 1ML (MISCELLANEOUS) IMPLANT
SYR 10ML LL (SYRINGE) IMPLANT
SYR 50ML SLIP (SYRINGE) IMPLANT
SYSTEM SAHARA CHEST DRAIN ATS (WOUND CARE) IMPLANT
TOWEL GREEN STERILE (TOWEL DISPOSABLE) ×4 IMPLANT
TOWEL GREEN STERILE FF (TOWEL DISPOSABLE) ×4 IMPLANT
TRAP SPECIMEN MUCOUS 40CC (MISCELLANEOUS) ×8 IMPLANT
TRAY FOLEY MTR SLVR 14FR STAT (SET/KITS/TRAYS/PACK) IMPLANT
TRAY FOLEY SLVR 16FR TEMP STAT (SET/KITS/TRAYS/PACK) IMPLANT
WATER STERILE IRR 1000ML POUR (IV SOLUTION) ×8 IMPLANT

## 2019-03-08 NOTE — Brief Op Note (Signed)
03/06/2019 - 03/08/2019  10:22 AM  PATIENT:  Betty Jackson  84 y.o. female  PRE-OPERATIVE DIAGNOSIS:  PERICARDIAL EFFUSION  POST-OPERATIVE DIAGNOSIS:  PERICARDIAL EFFUSION  PROCEDURE:  Subxyphoid pericardial window and drainage of pericardial effusion 200 ml  SURGEON:  Surgeon(s) and Role:    Ivin Poot, MD - Primary  PHYSICIAN ASSISTANT: Joellyn Rued . PA  ASSISTANTS:   ANESTHESIA:   general  EBL:  100 cc  BLOOD ADMINISTERED: 1 unit for preop anemia  DRAINs - 65F drain in pericardium  LOCAL MEDICATIONS USED:  none   SPECIMEN:  Excision- pericardial tissue to path and microbiology, drainage of pericardial effusion - fluid for culture, cytology   DISPOSITION OF SPECIMEN:  and microbiology  COUNTS:  YES  TOURNIQUET:  * No tourniquets in log *  DICTATION: .Dragon Dictation  PLAN OF CARE: return to [facility]  01  PATIENT DISPOSITION:  PACU - hemodynamically stable.   Delay start of Pharmacological VTE agent (>24hrs) due to surgical blood loss or risk of bleeding: yes- no eliquis until drain out, no lovenox for 48 hours

## 2019-03-08 NOTE — TOC Initial Note (Signed)
Transition of Care [facility]) - Initial/Assessment Note    Patient Details  Name: Betty Jackson MRN: [id number] Date of Birth: [birth year]  [facility]) CM/SW Contact:    Bethena Roys, RN Phone Number: 03/08/2019, 4:27 PM  Clinical Narrative: Patient transfer from [facility] Long-Readmission risk score high. Per MD notes- shock 2/2 pericardial effusion. Patient continues on IV Amio gtt. CM will continue to follow for transition of care needs.                     Expected Discharge Plan: [place]   Expected Discharge Plan and Services Expected Discharge Plan: [place]   Discharge Planning Services: CM Consult        Need for Family Participation in Patient Care: Yes (Comment) Care giver support system in place?: Yes (comment)   Criminal Activity/Legal Involvement Pertinent to Current Situation/Hospitalization: No - Comment as needed  Activities of Daily Living Home Assistive Devices/Equipment: Eyeglasses, Gilford Rile (specify type)(front wheeled walker, 4 wheeled walker) ADL Screening (condition at time of admission) Patient's cognitive ability adequate to safely complete daily activities?: No Is the patient deaf or have difficulty hearing?: No Does the patient have difficulty seeing, even when wearing glasses/contacts?: No Does the patient have difficulty concentrating, remembering, or making decisions?: Yes Patient able to express need for assistance with ADLs?: No Does the patient have difficulty dressing or bathing?: Yes Independently performs ADLs?: No Communication: Independent Dressing (OT): Dependent Is this a change from baseline?: Change from baseline, expected to last >3 days Grooming: Dependent Is this a change from baseline?: Change from baseline, expected to last >3 days Feeding: Dependent Is this a change from baseline?: Change from baseline, expected to last >3 days Bathing: Dependent Is this a change from  baseline?: Change from baseline, expected to last >3 days Toileting: Dependent Is this a change from baseline?: Change from baseline, expected to last >3days In/Out Bed: Dependent Is this a change from baseline?: Change from baseline, expected to last >3 days Walks in Home: Dependent Is this a change from baseline?: Change from baseline, expected to last >3 days Does the patient have difficulty walking or climbing stairs?: Yes(secondary to weakness and shortness of breath) Weakness of Legs: Both Weakness of Arms/Hands: Both   Admission diagnosis:  Chronic anticoagulation [Z79.01] Atypical chest pain [R07.89] Fever [R50.9] Sepsis due to undetermined organism ([place]) [A41.9] Pericarditis [I31.9] Patient Active Problem List   Diagnosis Date Noted  . Pericarditis 03/06/2019  . Fever   . Septic shock ([place])   . Atypical chest pain   . Atrial fibrillation with rapid ventricular response ([place])   . Nausea 01/17/2019  . Acute diverticulitis 01/13/2019  . AKI (acute kidney injury) ([place]) 01/13/2019  . Chronic pain 01/13/2019  . Tachycardia 01/13/2019  . Hypokalemia 01/13/2019  . Chronic low back pain 07/10/2018  . Influenza A 05/20/2017  . Sepsis ([place]) 05/20/2017  . Acute respiratory failure with hypoxia ([place]) 05/20/2017  . CKD (chronic kidney disease) stage 3, GFR 30-59 ml/min 05/20/2017  . PAF (paroxysmal atrial fibrillation) ([place]) 05/20/2017  . HTN (hypertension) 05/20/2017  . Giant cell arteritis ([place]) 05/20/2017  . Coronary disease 05/20/2017  . Abnormal urinalysis 05/20/2017  . Osteoporosis 05/08/2016  . Herniated nucleus pulposus, L5-S1, right 11/04/2015  . Major depressive disorder, recurrent episode, moderate ([place]) 07/16/2013  . Abdominal pain, epigastric 06/22/2013  . Lumbar stenosis 04/25/2013  . Chronic insomnia 02/07/2013  . Elevated transaminase level 02/07/2013  .  CVA (cerebral infarction) 10/29/2012  . Visual changes 10/28/2012  . Depression 10/28/2012  . Anxiety  state, unspecified 10/28/2012  . Anemia, unspecified 10/28/2012  . Nonspecific (abnormal) findings on radiological and other examination of gastrointestinal tract 01/25/2012  . Hyponatremia 01/24/2012  . Hematuria 01/24/2012  . Enteritis 12/24/2011  . Abdominal pain, other specified site 12/24/2011  . Leg pain, right 02/18/2011  . Temporal arteritis ([place]) 09/01/2010  . Hypertension 09/01/2010  . Hyperlipidemia 09/01/2010  . History of atrial fibrillation 09/01/2010  . CAD (coronary artery disease) 09/01/2010  . GERD (gastroesophageal reflux disease) 09/01/2010  . Insomnia 09/01/2010   PCP:  Eulas Post, MD Pharmacy:   [id number] [address], [place] [address] Phone: [phone] Fax: [place], [address] [place] [address] Phone: [phone] Fax: [phone]     Social Determinants of Health (SDOH) Interventions    Readmission Risk Interventions Readmission Risk Prevention Plan 03/08/2019  Medication Review (RN Care Manager) Complete  Some recent data might be hidden

## 2019-03-08 NOTE — Progress Notes (Signed)
_   NAME:  Betty Jackson, MRN:  [id number], DOB:  01/16/1935, LOS: 2 ADMISSION DATE:  03/06/2019, CONSULTATION DATE:  03/06/19 REFERRING MD:  Wilson Singer  CHIEF COMPLAINT:  Chest discomfort   Brief History   Betty Jackson is a 84 y.o. female who was admitted 12/7 with undifferentiated shock, presumed septic of unclear etiology +/- cardiogenic.  Has small pericardial effusion and known epicardial fat pad.  Evaluated by TCTS who recommends repeat echo in 24 hours.  History of present illness    Betty Jackson is a 84 y.o. female who has a PMH as outlined below  She presented to [facility] ED 12/7 with pain in mid sternal / chest area and radiating into epigastrium x 3 days.  Felt as if she couldn't swallow.  Associated with nausea and vomiting and feels pain somewhat improved with vomiting but not fully.  She was initially treated with GI cocktail.  Was also found to have borderline hypotension so was started on fluids.  CT A / P was read as pericarditis with mod to large pericardial effusion.  Incidentally also found to have subacute left ischial tuberosity avulsion fx with stable subacute to chronic right sacral ala insufficiency fx.   Given hypotension, there was concern for tamponade physiology; therefore, STAT echo was obtained.  This confirmed effusion; however, also showed fat pad which was seen on prior imaging.  EF 70-75% with rapid ventricular rate.   She was evaluated urgently by CVTS; however, was felt to NOT have significant pericardial effusion and no tamponade.  Rather, CVTS notes slight effusion in addition to the known fat pad.  They are recommending repeat echo in 24 hours.  Unfortunately, she had significant hypotension in ED; therefore, had CVL placed and was started on norepinephrine.  At the time of CVTS eval, she was febrile to 103 and was on 79mcg/min of NE.  Due to above, she was transferred to [facility] cardiac ICU.  Of note, she had recent admission 01/13/19 through 01/20/19.  She was discharged  on cipro + flagyl to complete full abx course.  Past Medical History  has Temporal arteritis ([place]); Hypertension; Hyperlipidemia; History of atrial fibrillation; CAD (coronary artery disease); GERD (gastroesophageal reflux disease); Insomnia; Leg pain, right; Enteritis; Abdominal pain, other specified site; Hyponatremia; Hematuria; Nonspecific (abnormal) findings on radiological and other examination of gastrointestinal tract; Visual changes; Depression; Anxiety state, unspecified; Anemia, unspecified; CVA (cerebral infarction); Chronic insomnia; Elevated transaminase level; Lumbar stenosis; Abdominal pain, epigastric; Major depressive disorder, recurrent episode, moderate ([place]); Herniated nucleus pulposus, L5-S1, right; Osteoporosis; Influenza A; Sepsis ([place]); Acute respiratory failure with hypoxia (HCC); CKD (chronic kidney disease) stage 3, GFR 30-59 ml/min; PAF (paroxysmal atrial fibrillation) ([place]); HTN (hypertension); Giant cell arteritis ([place]); Coronary disease; Abnormal urinalysis; Chronic low back pain; Acute diverticulitis; AKI (acute kidney injury) ([place]); Chronic pain; Tachycardia; Hypokalemia; Nausea; Atrial fibrillation with rapid ventricular response ([place]); Pericarditis; Fever; Septic shock ([place]); and Atypical chest pain on their problem list.  Significant Hospital Events   12/7 > admit.   Consults:  TCTS.  Procedures:  12/9 Subxyphoid pericardial window and drainage of pericardial effusion 200 ml. 12/9 L radial Aline 12/9 R IJ double lumen CVC  Significant Diagnostic Tests:  CT A / P 12/7 > ? Pericarditis with mod to large pericardial effusion.  Subacute left ischial tuberosity avulsion fx.  Stable subacute to chronic right sacral ala insufficiency fx.  Chronic biliary dilatation post cholecystectomy. Echo 12/7 > EF 70-75%, mod pericardial effusion, presence of pericardial fat  pad.  Micro Data:  Pericardial fluid> Blood 12/7 > NGTD  Sputum 12/7 >  Urine 12/7 >  SARS CoV2 12/7  > neg. Flu A/B neg MRSA surveillance negative   Antimicrobials:  Cefepime 12/6  Metronidazole 12/6 Zosyn 12/7 >   Vancomycin 12/7>  Interim history/subjective:  POD 0 Subxyphoid pericardial window and drainage of pericardial effusion 200 ml. Extubated post op. C/O chest discomfort and chronic back pain.  Was back in AF RVR overnight. Given amiodarone 150mg  IV, K+ replaced.  Back in SR now.   Objective:  Blood pressure (!) 157/76, pulse 70, temperature 98 F (36.7 C), temperature source Oral, resp. rate 13, height 5' 4.5" (1.638 m), weight 76.1 kg, SpO2 97 %.        Intake/Output Summary (Last 24 hours) at 03/08/2019 1400 Last data filed at 03/08/2019 1155 Gross per 24 hour  Intake 2646.6 ml  Output 1705 ml  Net 941.6 ml   Filed Weights   03/06/19 0550 03/07/19 0500 03/08/19 0500  Weight: 72.6 kg 74.6 kg 76.1 kg    Examination: General: Elderly, well-developed, well nourished. Appears uncomfortable. HENT: Normocephalic, PERRL. Moist mucus membranes Neck: No JVD. Trachea midline.  CV: RRR. S1S2. No MRG. +2 distal pulses. Subxyphoid drain in place with bloody drainage  Lungs: BBS present, clear, FNL, symmetrical ABD: +BS x4. SNT/ND. No masses, guarding or rigidity GU: Foley EXT: MAE well. Trace edema to lower EXT  Skin: Pale, WD. In tact. No rashes or lesions Neuro: A&Ox3. CN II-XII in tact. No focal deficits Psych: Appropriate mood, insight and judgment for time and situation    pCXR improved b/l aeration. Pericardial drain in place.  Assessment & Plan:   Shock likely 2/2 pericardial effusion with probable viral pericarditis. Also consider adrenal insufficiency / crisis (on chronic pred). Shock resolved -Wean empiric stress steroids. - Continue empiric vanc / zosyn for now. - Follow cultures. -Continuous IBP monitoring for now.  A-line can likely be discontinued 12/10   Pericardial effusion s/p pericardial window and drain. -Drain per CT surgery -Monitor  hemodynamics, currently stable   AKI-mild -maintain MAP >/=65 -avoid nephrotoxins -I/O   Steroid induced hyperglycemia -SSI    Hx PAF, HTN, HLD. AF RVR overnight, converted with Amiodarone and switching  - Continue amiodarone infusion for now  -goal K+ 4  goal Mag 2  -Restart home dose Cardizem -Continue to hold home Eliquis -Resume anticoagulation when okay with CT surgery -Resume home statin - Continue furosemide, losartan, lopressor   Hx temporal arteritis (on chronic prednisone 10mg  daily). - Stress steroids as above.  Hx diverticulitis (recent admission 01/13/19 through 01/20/19, treated with cipro / flagyl).  CT this admit without suggestion of recurrence. - Monitor.  Hx DDD, CVA, depression. -Resume home escitalopram, norco. -Patient's daughter requested that Dr.Elsner be notified of patient's hospitalization as patient was apparently supposed to receive additional imaging for DDD.  Message sent to same.   Best Practice:  Diet:ADAT Pain/Anxiety/Delirium protocol (if indicated): As needed analgesia VAP protocol (if indicated): In place. N/A. DVT prophylaxis: SCD's.  Chemoprophylaxis/anticoagulation currently on hold GI prophylaxis: PPI  Glucose control: SSI. Mobility: Bedrest. Code Status: Full code  Family Communication: Updated daughter extensively at bedside Disposition: continue ICU.  Labs   CBC: Recent Labs  Lab 03/06/19 0625 03/07/19 0419 03/08/19 0410  WBC 18.4* 14.2* 12.8*  NEUTROABS 13.1*  --   --   HGB 13.0 10.0* 8.7*  HCT 41.7 32.5* 26.3*  MCV 93.9 97.6 91.6  PLT 224 183 149*  Basic Metabolic Panel: Recent Labs  Lab 03/06/19 0625 03/06/19 2135 03/07/19 0419 03/08/19 0410  NA 140 141 139 139  K 3.9 4.2 4.4 3.2*  CL 101 106 107 104  CO2 24 22 20* 21*  GLUCOSE 153* 130* 241* 129*  BUN 17 18 20 23   CREATININE 0.96 1.57* 1.49* 1.59*  CALCIUM 9.0 8.2* 7.8* 8.2*  MG  --  1.6* 2.2 2.0  PHOS  --   --  5.4*  --    GFR: Estimated  Creatinine Clearance: 27.6 mL/min (A) (by C-G formula based on SCr of 1.59 mg/dL (H)). Recent Labs  Lab 03/06/19 0625  03/06/19 0826 03/06/19 1842 03/06/19 2200 03/07/19 0419 03/07/19 1001 03/08/19 0410  PROCALCITON  --   --   --  18.12  --   --   --   --   WBC 18.4*  --   --   --   --  14.2*  --  12.8*  LATICACIDVEN  --    < > 3.8*  --  3.0*  --  4.1* 2.1*   < > = values in this interval not displayed.   lation Profile: Recent Labs  Lab 03/06/19 0626 03/07/19 1330  INR 1.3* 2.0*   CBG: Recent Labs  Lab 03/07/19 1556 03/07/19 1953 03/07/19 2313 03/08/19 0401 03/08/19 0808  GLUCAP 110* 223* 137* 125* 105*     The patient is critically ill with pericardial effusion.  She requires ICU for high complexity decision making, titration of high alert medications, ventilator management, titration of oxygen and interpretation of advanced monitoring.    I personally spent 45 minutes providing critical care services including personally reviewing test results, discussing care with nursing staff/other physicians and completing orders pertaining to this patient.  Time was exclusive to the patient and does not include time spent teaching or in procedures.  Voice recognition software was used in the production of this record.  Errors in interpretation may have been inadvertently missed during review.  Francine Graven, MSN, AGACNP  [facility]

## 2019-03-08 NOTE — Anesthesia Procedure Notes (Signed)
Procedure Name: Intubation Date/Time: 03/08/2019 8:38 AM Performed by: Orlie Dakin, CRNA Pre-anesthesia Checklist: Patient identified, Emergency Drugs available, Suction available and Patient being monitored Patient Re-evaluated:Patient Re-evaluated prior to induction Oxygen Delivery Method: Circle system utilized Preoxygenation: Pre-oxygenation with 100% oxygen Induction Type: IV induction Ventilation: Oral airway inserted - appropriate to patient size and Mask ventilation without difficulty Laryngoscope Size: Miller and 3 Grade View: Grade I Tube type: Oral Tube size: 8.0 mm Number of attempts: 1 Airway Equipment and Method: Stylet Placement Confirmation: ETT inserted through vocal cords under direct vision,  positive ETCO2 and breath sounds checked- equal and bilateral Secured at: 24 cm Tube secured with: Tape Dental Injury: Teeth and Oropharynx as per pre-operative assessment  Comments: 4x4s bite block used at end of proc.

## 2019-03-08 NOTE — Anesthesia Procedure Notes (Signed)
Arterial Line Insertion Start/End12/11/2018 7:45 AM, 03/08/2019 7:52 AM Performed by: Verdie Drown, CRNA, CRNA  Patient location: Pre-op. Preanesthetic checklist: patient identified, IV checked, risks and benefits discussed, surgical consent, monitors and equipment checked, pre-op evaluation and timeout performed Lidocaine 1% used for infiltration and patient sedated Left, radial was placed Catheter size: 20 G Hand hygiene performed  and maximum sterile barriers used   Attempts: 2 Procedure performed without using ultrasound guided technique. Following insertion, dressing applied and Biopatch. Post procedure assessment: normal  Patient tolerated the procedure well with no immediate complications.

## 2019-03-08 NOTE — Telephone Encounter (Signed)
Opened in error

## 2019-03-08 NOTE — Progress Notes (Signed)
Pre Procedure note for inpatients:   Betty Jackson has been scheduled for Procedure(s): SUBXYPHOID PERICARDIAL WINDOW (N/A) TRANSESOPHAGEAL ECHOCARDIOGRAM (TEE) (N/A) today. The various methods of treatment have been discussed with the patient. After consideration of the risks, benefits and treatment options the patient has consented to the planned procedure.   The patient has been seen and labs reviewed. There are no changes in the patient's condition to prevent proceeding with the planned procedure today.  Recent labs:  Lab Results  Component Value Date   WBC 12.8 (H) 03/08/2019   HGB 8.7 (L) 03/08/2019   HCT 26.3 (L) 03/08/2019   PLT 149 (L) 03/08/2019   GLUCOSE 129 (H) 03/08/2019   CHOL 171 11/14/2018   TRIG 168.0 (H) 11/14/2018   HDL 63.40 11/14/2018   LDLCALC 74 11/14/2018   ALT 47 (H) 03/06/2019   AST 44 (H) 03/06/2019   NA 139 03/08/2019   K 3.2 (L) 03/08/2019   CL 104 03/08/2019   CREATININE 1.59 (H) 03/08/2019   BUN 23 03/08/2019   CO2 21 (L) 03/08/2019   TSH 3.085 03/06/2019   INR 2.0 (H) 03/07/2019   HGBA1C 5.9 (H) 03/07/2019    Len Childs, MD 03/08/2019 7:32 AM

## 2019-03-08 NOTE — Progress Notes (Signed)
eLink Physician-Brief Progress Note Patient Name: BERNICE GENETTI DOB: 25-Jun-1934 MRN: [id number]   Date of Service  03/08/2019  HPI/Events of Note  K+ 3.2, Creatinine 1.59, GFR 35  eICU Interventions  K+ replacement via modified electrolyte replacement protocol.        Kerry Kass Tasha Diaz 03/08/2019, 5:05 AM

## 2019-03-08 NOTE — Anesthesia Procedure Notes (Signed)
Central Venous Catheter Insertion Performed by: Murvin Natal, MD, anesthesiologist Start/End12/11/2018 9:40 AM, 03/08/2019 9:50 AM Patient location: OR. Preanesthetic checklist: patient identified, IV checked, site marked, risks and benefits discussed, surgical consent, monitors and equipment checked, pre-op evaluation, timeout performed and anesthesia consent Position: Trendelenburg Patient sedated Hand hygiene performed , maximum sterile barriers used  and Seldinger technique used Catheter size: 8 Fr Total catheter length 16. Central line was placed.Double lumen Procedure performed using ultrasound guided technique. Ultrasound Notes:anatomy identified, needle tip was noted to be adjacent to the nerve/plexus identified, no ultrasound evidence of intravascular and/or intraneural injection and image(s) printed for medical record Attempts: 1 Following insertion, dressing applied, line sutured and Biopatch. Post procedure assessment: blood return through all ports, free fluid flow and no air  Patient tolerated the procedure well with no immediate complications.

## 2019-03-08 NOTE — Plan of Care (Signed)

## 2019-03-08 NOTE — Progress Notes (Signed)
eLink Physician-Brief Progress Note Patient Name: Betty Jackson DOB: [birth year] MRN: [id number]   Date of Service  03/08/2019  HPI/Events of Note  Afib with RVR despite Amiodarone infusion at 30 mg/hour, heart rate 130-140, Pt BP stable.  eICU Interventions  Amiodarone 150 mg iv bolus x 1        Zavien Clubb U Shanzay Hepworth 03/08/2019, 12:00 AM

## 2019-03-08 NOTE — Progress Notes (Signed)
Patient HR went into Afib with RVR 130's-140's. Patient is A&Ox4. Dr. Lucile Shutters with [facility] paged. Amiodarone bolus given at [place]. After an hour patient is still in Afib with RVR in the 120's and patient is asleep. Dr. Lucile Shutters with [facility] called and awaiting response. Will continue to monitor patient closely.

## 2019-03-08 NOTE — Progress Notes (Signed)
eLink Physician-Brief Progress Note Patient Name: Betty Jackson DOB: [birth year] MRN: [id number]   Date of Service  03/08/2019  HPI/Events of Note  Afib with RVR (rate between 110-130 despite Amiodarone 150 mg iv bolus, Pt is hemodynamically stable.  eICU Interventions  Will defer further Rx until pericardial window in a.m. as rhythm is likely dependent on resolving tamponade , and Rx with  Beta blocker or calcium channel blocker may lead to hemodynamic collapse.        Aneth Schlagel U Markevion Lattin 03/08/2019, 1:33 AM

## 2019-03-08 NOTE — Transfer of Care (Signed)
Immediate Anesthesia Transfer of Care Note  Patient: Leinani L Quickel  Procedure(s) Performed: SUBXYPHOID PERICARDIAL WINDOW (N/A ) TRANSESOPHAGEAL ECHOCARDIOGRAM (TEE) (N/A ) Chest Tube Insertion (Left )  Patient Location: PACU  Anesthesia Type:General  Level of Consciousness: drowsy  Airway & Oxygen Therapy: Patient Spontanous Breathing and Patient connected to nasal cannula oxygen  Post-op Assessment: Report given to RN and Post -op Vital signs reviewed and stable  Post vital signs: Reviewed and stable  Last Vitals:  Vitals Value Taken Time  BP 174/73 03/08/19 1054  Temp    Pulse 75 03/08/19 1057  Resp 19 03/08/19 1057  SpO2 96 % 03/08/19 1057  Vitals shown include unvalidated device data.  Last Pain:  Vitals:   03/08/19 0400  TempSrc: Oral  PainSc: Asleep      Patients Stated Pain Goal: 7 ([id number])  Complications: No apparent anesthesia complications

## 2019-03-08 NOTE — Anesthesia Postprocedure Evaluation (Signed)
Anesthesia Post Note  Patient: Betty Jackson  Procedure(s) Performed: SUBXYPHOID PERICARDIAL WINDOW (N/A ) TRANSESOPHAGEAL ECHOCARDIOGRAM (TEE) (N/A ) Chest Tube Insertion (Left )     Patient location during evaluation: PACU Anesthesia Type: General Level of consciousness: awake Pain management: pain level controlled Vital Signs Assessment: post-procedure vital signs reviewed and stable Respiratory status: spontaneous breathing, nonlabored ventilation, respiratory function stable and patient connected to nasal cannula oxygen Cardiovascular status: blood pressure returned to baseline and stable Postop Assessment: no apparent nausea or vomiting Anesthetic complications: no    Last Vitals:  Vitals:   03/08/19 1600 03/08/19 1630  BP: 127/85 135/85  Pulse: 72 81  Resp: 16 (!) 21  Temp:    SpO2: 97% 96%    Last Pain:  Vitals:   03/08/19 1606  TempSrc:   PainSc: 7                  Ryan P Ellender

## 2019-03-09 ENCOUNTER — Inpatient Hospital Stay (HOSPITAL_COMMUNITY): Payer: Medicare Other

## 2019-03-09 DIAGNOSIS — I319 Disease of pericardium, unspecified: Secondary | ICD-10-CM

## 2019-03-09 DIAGNOSIS — I313 Pericardial effusion (noninflammatory): Secondary | ICD-10-CM

## 2019-03-09 LAB — ECHOCARDIOGRAM LIMITED
Height: 64.5 in
Weight: 2873.6 oz

## 2019-03-09 LAB — POCT I-STAT 7, (LYTES, BLD GAS, ICA,H+H)
Acid-base deficit: 3 mmol/L — ABNORMAL HIGH (ref 0.0–2.0)
Bicarbonate: 21.3 mmol/L (ref 20.0–28.0)
Calcium, Ion: 1.16 mmol/L (ref 1.15–1.40)
HCT: 29 % — ABNORMAL LOW (ref 36.0–46.0)
Hemoglobin: 9.9 g/dL — ABNORMAL LOW (ref 12.0–15.0)
O2 Saturation: 96 %
Patient temperature: 97.5
Potassium: 3.6 mmol/L (ref 3.5–5.1)
Sodium: 137 mmol/L (ref 135–145)
TCO2: 22 mmol/L (ref 22–32)
pCO2 arterial: 33 mmHg (ref 32.0–48.0)
pH, Arterial: 7.415 (ref 7.350–7.450)
pO2, Arterial: 79 mmHg — ABNORMAL LOW (ref 83.0–108.0)

## 2019-03-09 LAB — GLUCOSE, CAPILLARY
Glucose-Capillary: 121 mg/dL — ABNORMAL HIGH (ref 70–99)
Glucose-Capillary: 164 mg/dL — ABNORMAL HIGH (ref 70–99)
Glucose-Capillary: 88 mg/dL (ref 70–99)
Glucose-Capillary: 105 mg/dL — ABNORMAL HIGH (ref 70–99)
Glucose-Capillary: 145 mg/dL — ABNORMAL HIGH (ref 70–99)

## 2019-03-09 LAB — CBC
HCT: 31.3 % — ABNORMAL LOW (ref 36.0–46.0)
Hemoglobin: 10.1 g/dL — ABNORMAL LOW (ref 12.0–15.0)
MCH: 29.5 pg (ref 26.0–34.0)
MCHC: 32.3 g/dL (ref 30.0–36.0)
MCV: 91.5 fL (ref 80.0–100.0)
Platelets: 169 10*3/uL (ref 150–400)
RBC: 3.42 MIL/uL — ABNORMAL LOW (ref 3.87–5.11)
RDW: 15.1 % (ref 11.5–15.5)
WBC: 13.2 10*3/uL — ABNORMAL HIGH (ref 4.0–10.5)
nRBC: 0 % (ref 0.0–0.2)

## 2019-03-09 LAB — BASIC METABOLIC PANEL
Anion gap: 13 (ref 5–15)
BUN: 30 mg/dL — ABNORMAL HIGH (ref 8–23)
CO2: 21 mmol/L — ABNORMAL LOW (ref 22–32)
Calcium: 8.3 mg/dL — ABNORMAL LOW (ref 8.9–10.3)
Chloride: 105 mmol/L (ref 98–111)
Creatinine, Ser: 1.89 mg/dL — ABNORMAL HIGH (ref 0.44–1.00)
GFR calc Af Amer: 28 mL/min — ABNORMAL LOW (ref >60)
GFR calc non Af Amer: 24 mL/min — ABNORMAL LOW (ref >60)
Glucose, Bld: 124 mg/dL — ABNORMAL HIGH (ref 70–99)
Potassium: 3.7 mmol/L (ref 3.5–5.1)
Sodium: 139 mmol/L (ref 135–145)

## 2019-03-09 LAB — ACID FAST SMEAR (AFB, MYCOBACTERIA)
Acid Fast Smear: NEGATIVE
Acid Fast Smear: NEGATIVE

## 2019-03-09 LAB — VANCOMYCIN, RANDOM: Vancomycin Rm: 14

## 2019-03-09 LAB — CYTOLOGY - NON PAP

## 2019-03-09 LAB — LACTIC ACID, PLASMA: Lactic Acid, Venous: 2.1 mmol/L (ref 0.5–1.9)

## 2019-03-09 LAB — MAGNESIUM: Magnesium: 2.3 mg/dL (ref 1.7–2.4)

## 2019-03-09 MED ORDER — INSULIN ASPART 100 UNIT/ML ~~LOC~~ SOLN
0.0000 [IU] | Freq: Three times a day (TID) | SUBCUTANEOUS | Status: DC
  Administered 2019-03-10 – 2019-03-13 (×3): 1 [IU] via SUBCUTANEOUS

## 2019-03-09 MED ORDER — AMIODARONE IV BOLUS ONLY 150 MG/100ML
150.0000 mg | Freq: Once | INTRAVENOUS | Status: AC
  Administered 2019-03-09: 150 mg via INTRAVENOUS

## 2019-03-09 MED ORDER — METOPROLOL TARTRATE 5 MG/5ML IV SOLN
2.5000 mg | Freq: Once | INTRAVENOUS | Status: AC
  Administered 2019-03-09: 2.5 mg via INTRAVENOUS
  Filled 2019-03-09: qty 5

## 2019-03-09 MED ORDER — VANCOMYCIN VARIABLE DOSE PER UNSTABLE RENAL FUNCTION (PHARMACIST DOSING): Status: DC

## 2019-03-09 MED ORDER — INSULIN ASPART 100 UNIT/ML ~~LOC~~ SOLN: 0.0000 [IU] | Freq: Every day | SUBCUTANEOUS | Status: DC

## 2019-03-09 MED ORDER — METOPROLOL TARTRATE 5 MG/5ML IV SOLN
5.0000 mg | Freq: Once | INTRAVENOUS | Status: AC
  Administered 2019-03-09: 5 mg via INTRAVENOUS
  Filled 2019-03-09: qty 5

## 2019-03-09 MED ORDER — FUROSEMIDE 10 MG/ML IJ SOLN
20.0000 mg | Freq: Two times a day (BID) | INTRAMUSCULAR | Status: AC
  Administered 2019-03-09 – 2019-03-10 (×4): 20 mg via INTRAVENOUS
  Filled 2019-03-09 (×4): qty 2

## 2019-03-09 NOTE — Progress Notes (Signed)
1 Day Post-Op Procedure(s) (LRB): SUBXYPHOID PERICARDIAL WINDOW (N/A) TRANSESOPHAGEAL ECHOCARDIOGRAM (TEE) (N/A) Chest Tube Insertion (Left) Subjective: Pain improved Remains in afib, controlled rate with amioarone, diltiazam Minimal bloody drainage from pericardial drain- will DC tomorrow if postop ECHO is satisfactory and resume Eliquis for afib. Gram stain of pericardial tissue - no WBC no organisms. Cont iv antibiotics Expected post op blood loss anemia in addition to preop sepsis and anemia of chronic disease Objective: Vital signs in last 24 hours: Temp:  [97.5 F (36.4 C)-98.3 F (36.8 C)] 97.9 F (36.6 C) (12/10 0800) Pulse Rate:  [70-131] 131 (12/10 0800) Cardiac Rhythm: Atrial fibrillation;Atrial flutter (12/10 0800) Resp:  [12-30] 19 (12/10 0800) BP: (94-174)/(60-95) 160/75 (12/10 0920) SpO2:  [82 %-99 %] 96 % (12/10 0800) Arterial Line BP: (115-208)/(59-79) 132/66 (12/10 0800) Weight:  [81.5 kg] 81.5 kg (12/10 0600)  [place] Intake/Output from previous day: 12/09 0701 - 12/10 0700 In: 2321.6 [I.V.:1993.6; Blood:328] Out: 1475 [Urine:935; Blood:500; Chest Tube:40] Intake/Output this shift: Total I/O In: 235.5 [I.V.:35.5; IV Piggyback:200] Out: 75 [Urine:75]       Exam    General- alert and comfortable    Neck- no JVD, no cervical adenopathy palpable, no carotid bruit   Lungs- clear without rales, wheezes   Cor- regular rate and rhythm, no murmur , gallop, no pericardial rub   Abdomen- soft, non-tender   Extremities - warm, non-tender, minimal edema   Neuro- oriented, appropriate, no focal weakness   Lab Results: Recent Labs    03/08/19 0410 03/09/19 0339 03/09/19 0349  WBC 12.8* 13.2*  --   HGB 8.7* 10.1* 9.9*  HCT 26.3* 31.3* 29.0*  PLT 149* 169  --    BMET:  Recent Labs    03/08/19 0410 03/09/19 0339 03/09/19 0349  NA 139 139 137  K 3.2* 3.7 3.6  CL 104 105  --   CO2 21* 21*  --   GLUCOSE 129* 124*  --   BUN 23 30*  --   CREATININE  1.59* 1.89*  --   CALCIUM 8.2* 8.3*  --     PT/INR:  Recent Labs    03/07/19 1330  LABPROT 22.3*  INR 2.0*   ABG    Component Value Date/Time   PHART 7.415 03/09/2019 0349   HCO3 21.3 03/09/2019 0349   TCO2 22 03/09/2019 0349   ACIDBASEDEF 3.0 (H) 03/09/2019 0349   O2SAT 96.0 03/09/2019 0349   CBG (last 3)  Recent Labs    03/08/19 2343 03/09/19 0313 03/09/19 0755  GLUCAP 118* 121* 164*    Assessment/Plan: S/P Procedure(s) (LRB): SUBXYPHOID PERICARDIAL WINDOW (N/A) TRANSESOPHAGEAL ECHOCARDIOGRAM (TEE) (N/A) Chest Tube Insertion (Left) Will ask PT to help with ambulation DC a-line, foley Remove pericardial drain tomorrow and transfer to stepdown   LOS: 3 days    Tharon Aquas Trigt III 03/09/2019

## 2019-03-09 NOTE — Op Note (Signed)
NAME: Betty Jackson, Betty L. MEDICAL RECORD [id number] ACCOUNT [id number] DATE OF BIRTH:10-May-1934 FACILITY: [facility] LOCATION: [facility] PHYSICIAN:Sarahmarie Leavey VAN TRIGT III, MD  OPERATIVE REPORT  DATE OF PROCEDURE:  03/08/2019  OPERATION:  Subxiphoid pericardial window and drainage of pericardial effusion.  SURGEON:  Ivin Poot, MD  ASSISTANT:  Shaaron Adler, PA-C.  PREOPERATIVE DIAGNOSIS:  Pericarditis with moderate pericardial effusion.  POSTOPERATIVE DIAGNOSIS:  Pericarditis with moderate pericardial effusion.  ANESTHESIA:  General.  CLINICAL NOTE:  The patient is an 84 year old female who was admitted through the ED with a fever of 103 and burning chest pain with hypotension and elevated lactate.  She was admitted by the critical care service after CT scan of the abdomen for recent  history of diverticulitis showed no evidence of abdominal abscess.  Chest x-ray and the lower thorax seen on CT scan showed no evidence of pneumonia,pleural effusion or empyema.  An echocardiogram was performed which showed a small to moderate  pericardial effusion, which subsequently increased over the next 48 hours.  Cultures of blood, urine and nasal passage were negative and she responded well to broad-spectrum antibiotics with resolution of her fever, improvement in mental status and  improved hemodynamics without the need for pressors.  With her subsequent pericardial effusion increasing on serial echo, I agreed with the recommendation for a subxiphoid pericardial window to culture of the fluid and tissue and to prevent the risk of  tamponade as she also had evidence of some RV compression on her recent echo.  I discussed the procedure of subxiphoid window with the patient and her daughter, Betty Jackson, including the use of general anesthesia, the location of the surgical incision and  the expected postoperative recovery.  She understood that there would be a pericardial drain in place.  She understood the risks of  bleeding, infection, organ failure, pulmonary problems including pleural effusion,  pneumothorax and death.  The patient  demonstrated an understanding and agreed to proceed with surgery under what I felt was informed consent.  They understood that the DNR order on her admission would be rescinded for this procedure and its early recovery phase.  DESCRIPTION OF PROCEDURE:  The patient was brought to the operating room and placed supine on the operating table.  An A-line was placed.  The right IJ catheter was placed for central venous access.  A transesophageal echo probe was placed by the  anesthesiologist, which confirmed the presence of an increasing pericardial effusion.  The chest and abdomen were prepped and draped as a sterile field.  A proper time-out was performed.  A small incision was made centered on the xiphoid.  The abdominal  wall fat and connective tissue were dissected away and the xiphoid process was removed.  The retractor was placed and the sternum was elevated.  Further soft tissue anterior to the pericardium was dissected and the pericardium was exposed.  An incision  was made in the pericardium and about 200 mL of serosanguineous fluid was removed.  It was not cloudy or particulate.  This was sent for both cultures, cytology and the tissue subsequently removed from the pericardium was sent for pathology and cultures.   After the fluid was drained, a pericardial window was created by removing a 3 cm portion of anterior pericardial tissue.  Through this opening, the pericardial space was irrigated with copious amounts of warm saline.  After the irrigation had been  completed, hemostasis was achieved.  A Bard pericardial drain was placed through a separate incision in the posterior  pericardium.  The pericardium had some adhesions between the epicardium and the pericardium.  The epicardium showed fibrinous exudate  consistent with viral pericarditis.  When the pericardial drain was placed  dependently, there was some venous bleeding from epicardial adhesion.  This was treated with topical agents, including Surgicel and gentle pressure.  There was some minimal blood  loss associated with this event and the patient remained stable.  After compression and topical Surgicel, the venous bleeding resolved.  A smaller Bard catheter was then placed dependently in the pericardial space without recurrent bleeding.  The  surgical field was observed for several minutes to make sure that there was no delayed bleeding.  The drain was secured to the skin.  The lower sternum above the xiphoid process which had been opened slightly was closed with a single sternal wire.  The  pectoralis fascia was closed with interrupted #1 Vicryl.  Subcutaneous and skin layers were closed using running Vicryl.  The sterile dressing was applied.  The transesophageal echo after the drainage of the effusion showed no significant residual pericardial effusion.  The patient was extubated in the operating room and transferred to the recovery room for further observation prior to returning to her room in the ICU.  VN/NUANCE  D:03/08/2019 T:03/08/2019 JOB:[id number]

## 2019-03-09 NOTE — Progress Notes (Signed)
Pharmacy Antibiotic Note  Betty Jackson is a 84 y.o. female admitted on 03/06/2019 with pericardial effusion s/p pericardial window. She is on vancomycin and zosyn for possible PNA.  -cultures- ngtd -SCr= 1.89  Spoke to Dr. Vaughan Browner. Will discontinue vancomycin. Continue zosyn for now but will de-escalate soon  Plan: -discontinue vancomycin  -Consider zosyn 3.375gm IV q8h -Will follow renal function, cultures and clinical progress    Height: 5' 4.5" (163.8 cm) Weight: 179 lb 9.6 oz (81.5 kg) IBW/kg (Calculated) : 55.85  Temp (24hrs), Avg:97.9 F (36.6 C), Min:97.5 F (36.4 C), Max:98.3 F (36.8 C)  Recent Labs  Lab 03/06/19 0625 03/06/19 0826 03/06/19 2135 03/06/19 2200 03/07/19 0419 03/07/19 1001 03/08/19 0410 03/09/19 0339 03/09/19 0920 03/09/19 1212  WBC 18.4*  --   --   --  14.2*  --  12.8* 13.2*  --   --   CREATININE 0.96  --  1.57*  --  1.49*  --  1.59* 1.89*  --   --   LATICACIDVEN  --  3.8*  --  3.0*  --  4.1* 2.1*  --  2.1*  --   VANCORANDOM  --   --   --   --   --   --   --   --   --  14    Estimated Creatinine Clearance: 23.9 mL/min (A) (by C-G formula based on SCr of 1.89 mg/dL (H)).    Allergies  Allergen Reactions  . Dextromethorphan Rash    Antimicrobials this admission: 12/7 vanc>>12/10 12/7 zosyn 12.7 cefepime x1  Dose adjustments this admission:   Microbiology results: 12/7 BCx: ngtd 12/7 urine: neg 12/0 pericardial fluid  Thank you for allowing pharmacy to be a part of this patient's care.  Hildred Laser, PharmD Clinical Pharmacist **Pharmacist phone directory can now be found on [email or web address] ([email or web address]).  Listed under [employer].

## 2019-03-09 NOTE — Progress Notes (Addendum)
_   NAME:  Betty Jackson, MRN:  [id number], DOB:  Aug 31, 1934, LOS: 3 ADMISSION DATE:  03/06/2019, CONSULTATION DATE:  03/06/19 REFERRING MD:  Wilson Singer  CHIEF COMPLAINT:  Chest discomfort   Brief History   Betty Jackson is a 84 y.o. female who was admitted 12/7 with undifferentiated shock,, pericardial tamponade status post pericardial window  Past Medical History  has Temporal arteritis ([place]); Hypertension; Hyperlipidemia; History of atrial fibrillation; CAD (coronary artery disease); GERD (gastroesophageal reflux disease); Insomnia; Leg pain, right; Enteritis; Abdominal pain, other specified site; Hyponatremia; Hematuria; Nonspecific (abnormal) findings on radiological and other examination of gastrointestinal tract; Visual changes; Depression; Anxiety state, unspecified; Anemia, unspecified; CVA (cerebral infarction); Chronic insomnia; Elevated transaminase level; Lumbar stenosis; Abdominal pain, epigastric; Major depressive disorder, recurrent episode, moderate ([place]); Herniated nucleus pulposus, L5-S1, right; Osteoporosis; Influenza A; Sepsis ([place]); Acute respiratory failure with hypoxia ([facility]); CKD (chronic kidney disease) stage 3, GFR 30-59 ml/min; PAF (paroxysmal atrial fibrillation) ([place]); HTN (hypertension); Giant cell arteritis ([place]); Coronary disease; Abnormal urinalysis; Chronic low back pain; Acute diverticulitis; AKI (acute kidney injury) ([place]); Chronic pain; Tachycardia; Hypokalemia; Nausea; Atrial fibrillation with rapid ventricular response ([place]); Pericarditis; Fever; Septic shock ([place]); and Atypical chest pain on their problem list.  Significant Hospital Events   12/7 > admit. 12/9 > pericardial window, drain with CVTS 12/10> in Afib. Off pressors   Consults:  TCTS.  Procedures:  12/9 Subxyphoid pericardial window and drainage of pericardial effusion 200 ml. 12/9 L radial Aline-12/10 12/9 R IJ double lumen CVC  Significant Diagnostic Tests:  CT A / P 12/7 > ? Pericarditis with mod  to large pericardial effusion.  Subacute left ischial tuberosity avulsion fx.  Stable subacute to chronic right sacral ala insufficiency fx.  Chronic biliary dilatation post cholecystectomy. Echo 12/7 > EF 70-75%, mod pericardial effusion, presence of pericardial fat pad.  Micro Data:  Pericardial fluid> Blood 12/7 > NGTD  Sputum 12/7 >  Urine 12/7 >  SARS CoV2 12/7 > neg. Flu A/B neg MRSA surveillance negative   Antimicrobials:  Cefepime 12/6  Metronidazole 12/6 Zosyn 12/7 >   Vancomycin 12/7>  Interim history/subjective:  POD1 pericardial window, drain Back in Afib Off pressors   Objective:  Blood pressure 117/81, pulse (!) 118, temperature (!) 97.5 F (36.4 C), temperature source Oral, resp. rate (!) 27, height 5' 4.5" (1.638 m), weight 81.5 kg, SpO2 95 %.        Intake/Output Summary (Last 24 hours) at 03/09/2019 0746 Last data filed at 03/09/2019 0600 Gross per 24 hour  Intake 2321.56 ml  Output 1475 ml  Net 846.56 ml   Filed Weights   03/07/19 0500 03/08/19 0500 03/09/19 0600  Weight: 74.6 kg 76.1 kg 81.5 kg    Examination: General: WDWN elderly female, reclined in bed NAD  HENT: NCAT. Pink mmm. Trachea midline. R IJ CVC appropriately dressed. Anicteric sclera  [id number] s1s2 no rgm. subxyphoid drain with sanguinous output. 2+ radial pulses  Lungs: CTA. Unlabored respirations on 2LNC  ABD: soft, round, ndnt. Normoactive x4  GU: foley with clear yellow urine  EXT: Trace BLE edema. Symmetrical muscle bulk and tone. No cyanosis or clubbing  Skin: pale, clean, dry warm without rash  Neuro: AAOx4 following commands. No focal deficits  Psych: euthymic mood congruent affect, appropriate for age and situation    Assessment & Plan:   Shock 2/2 pericardial effusion, resolved  -probably viral pericarditis  -consider adrenal insufficiency in setting of chronic pred P -dc neo -continue weaning empiric  stress steroids -with neo off, will plan to dc arterial line   -follow body fluid cx, ok to continue vanc and zosyn in interim with plan to dc if cultures are unremarkable  Pericardial effusion s/p pericardial window and drain -op date 12/9  P -drain per CVTS -continue ICU monitoring of hemodynamics -anticoagulation per CVTS -- continuing non chemical vte ppx at present   AKI -Cr slightly uptrending Hx incontinence  P -trend UOP  -dc foley for purewick  -MAP goal > 65 for renal perfusion; is off pressors  -minimize nephrotoxic agents as able   Hyperglycemia -in setting of stress dose steroids, critical illness/hypermetabolic response P -SSI  Hx PAF, HTN, HLD -eliquis at home for aFib -has been on amio gtt inpatient with initial conversion to nsr now is back in afib with variable rate  P -Holding eliquis at present in setting of recent procedure  -Anticoagulation per CVTS  -Continue amio gtt with plan to convert to PO -K goal 4 mag goal 2 -Continue home dose dilt, home statin  Hx temporal arteritis (on chronic prednisone 10mg  daily)P - Weaning stress dose steroids, will ultimately wean to home steroid dose   Hx diverticulitis (recent admission 01/13/19 through 01/20/19, treated with cipro / flagyl).  CT this admit without suggestion of recurrence. - Supportive care  Hx DDD, CVA, depression. -Patient's daughter requested that Dr.Elsner be notified of patient's hospitalization as patient was apparently supposed to receive additional imaging for DDD.  PCCM sent message P -Home escitalopram, norco.   Best Practice:  Diet: advance as tolerated  Pain/Anxiety/Delirium protocol (if indicated): As needed analgesia VAP protocol (if indicated): In place. N/A. DVT prophylaxis: SCD's. Chemical vte ppx on hold  GI prophylaxis: PPI  Glucose control: SSI. Mobility: Bedrest. Code Status: Previously DNR, made full code for procedure 12/9. Discussed with patient at bedside 12/10 and code status will be updated now to reflect previous DNR  status. Family Communication: patient at bedside  Disposition: ICU at present. Patient is progressing and is off pressors at this time. Likely can transfer out of ICU tomorrow   Labs   CBC: Recent Labs  Lab 03/06/19 0625 03/07/19 0419 03/08/19 0410 03/08/19 1603 03/09/19 0339 03/09/19 0349  WBC 18.4* 14.2* 12.8*  --  13.2*  --   NEUTROABS 13.1*  --   --   --   --   --   HGB 13.0 10.0* 8.7* 9.5* 10.1* 9.9*  HCT 41.7 32.5* 26.3* 28.0* 31.3* 29.0*  MCV 93.9 97.6 91.6  --  91.5  --   PLT 224 183 149*  --  169  --    Basic Metabolic Panel: Recent Labs  Lab 03/06/19 0625 03/06/19 2135 03/07/19 0419 03/08/19 0410 03/08/19 1603 03/09/19 0339 03/09/19 0349  NA 140 141 139 139 136 139 137  K 3.9 4.2 4.4 3.2* 3.9 3.7 3.6  CL 101 106 107 104  --  105  --   CO2 24 22 20* 21*  --  21*  --   GLUCOSE 153* 130* 241* 129*  --  124*  --   BUN 17 18 20 23   --  30*  --   CREATININE 0.96 1.57* 1.49* 1.59*  --  1.89*  --   CALCIUM 9.0 8.2* 7.8* 8.2*  --  8.3*  --   MG  --  1.6* 2.2 2.0  --  2.3  --   PHOS  --   --  5.4*  --   --   --   --  GFR: Estimated Creatinine Clearance: 23.9 mL/min (A) (by C-G formula based on SCr of 1.89 mg/dL (H)). Recent Labs  Lab 03/06/19 0625 03/06/19 0826 03/06/19 1842 03/06/19 2200 03/07/19 0419 03/07/19 1001 03/08/19 0410 03/09/19 0339  PROCALCITON  --   --  18.12  --   --   --   --   --   WBC 18.4*  --   --   --  14.2*  --  12.8* 13.2*  LATICACIDVEN  --  3.8*  --  3.0*  --  4.1* 2.1*  --    lation Profile: Recent Labs  Lab 03/06/19 0626 03/07/19 1330  INR 1.3* 2.0*   CBG: Recent Labs  Lab 03/08/19 0808 03/08/19 1526 03/08/19 1958 03/08/19 2343 03/09/19 0313  GLUCAP 105* 132* 220* 118* 121*     CRITICAL CARE Performed by: Cristal Generous   Total critical care time: 35 minutes  Critical care time was exclusive of separately billable procedures and treating other patients. Critical care was necessary to treat or prevent  imminent or life-threatening deterioration.  Critical care was time spent personally by me on the following activities: development of treatment plan with patient and/or surrogate as well as nursing, discussions with consultants, evaluation of patient's response to treatment, examination of patient, obtaining history from patient or surrogate, ordering and performing treatments and interventions, ordering and review of laboratory studies, ordering and review of radiographic studies, pulse oximetry and re-evaluation of patient's condition.  Eliseo Gum MSN, AGACNP-BC [place] [phone] If no answer, [id number] 03/09/2019, 7:46 AM

## 2019-03-09 NOTE — Progress Notes (Signed)
Patient ID: Betty Jackson, female   DOB: 12-Jul-1934, 84 y.o.   MRN: [id number] TCTS Evening Rounds:  Hemodynamically stable.  Pericardial tube output remains low.

## 2019-03-09 NOTE — Progress Notes (Signed)
  Echocardiogram 2D limited has been performed.  Matilde Bash 03/09/2019, 2:52 PM

## 2019-03-09 NOTE — Evaluation (Signed)
Physical Therapy Evaluation Patient Details Name: Betty Jackson MRN: [id number] DOB: 04-May-1934 Today's Date: 03/09/2019   History of Present Illness  84 year old with with PMH RA, A-fib, CAD, LBP, scoliosis, DDD, CVA, CKD, admitted with shock secondary to adrenal insufficiency, pericardial effusion status post pericardial window, atrial fibrillation  Clinical Impression  Patient participated in limited evaluation today due to fatigued after up to chair with nursing.  She likely has decreased mobility over her baseline which includes two recent falls at home.  Was already set up with HHPT and reports she feels she can return home with the help of her sister, her husband's aide and HHPT, but will further assess mobility next session to determine if indeed able to return home or if she needs SNF level rehab.  PT to follow.     Follow Up Recommendations Home health PT    Equipment Recommendations  None recommended by PT    Recommendations for Other Services       Precautions / Restrictions Precautions Precautions: Fall Precaution Comments: 2 recent falls with vertebral compression fx and sacral and ischial tuberosity fx's      Mobility  Bed Mobility Overal bed mobility: Needs Assistance Bed Mobility: Rolling Rolling: Min guard         General bed mobility comments: patient positioned in L sidelying with pillow between legs for comfort; declined further mobility due to just returned to bed after up in chair  Transfers                    Ambulation/Gait                Stairs            Wheelchair Mobility    Modified Rankin (Stroke Patients Only)       Balance                                             Pertinent Vitals/Pain Pain Assessment: Faces Faces Pain Scale: Hurts even more Pain Location: chest pain, and back and hip pain Pain Descriptors / Indicators: Sore;Grimacing;Guarding Pain Intervention(s):  Repositioned;Monitored during session;Limited activity within patient's tolerance    Home Living Family/patient expects to be discharged to:: Private residence Living Arrangements: Spouse/significant other;Other relatives(sister) Available Help at Discharge: Family;Available PRN/intermittently Type of Home: House Home Access: Level entry     Home Layout: One level Home Equipment: Walker - 4 wheels;Walker - 2 wheels;Grab bars - tub/shower;Grab bars - toilet      Prior Function Level of Independence: Needs assistance   Gait / Transfers Assistance Needed: uses rollator  ADL's / Homemaking Assistance Needed: performs BADL's on her own, was getting houskeeping help and has aide from [place] who helps with her spouse        Hand Dominance        Extremity/Trunk Assessment   Upper Extremity Assessment Upper Extremity Assessment: Generalized weakness    Lower Extremity Assessment Lower Extremity Assessment: Generalized weakness    Cervical / Trunk Assessment Cervical / Trunk Assessment: Kyphotic  Communication   Communication: No difficulties  Cognition Arousal/Alertness: Awake/alert Behavior During Therapy: WFL for tasks assessed/performed Overall Cognitive Status: Within Functional Limits for tasks assessed  General Comments General comments (skin integrity, edema, etc.): was getting HHPT prior to admission    Exercises     Assessment/Plan    PT Assessment Patient needs continued PT services  PT Problem List Decreased strength;Decreased activity tolerance;Decreased mobility;Decreased balance;Pain;Decreased safety awareness;Cardiopulmonary status limiting activity;Decreased knowledge of use of DME       PT Treatment Interventions Therapeutic activities;Balance training;Therapeutic exercise;Functional mobility training;Gait training;Patient/family education    PT Goals (Current goals can be found in the Care Plan  section)  Acute Rehab PT Goals Patient Stated Goal: to go home PT Goal Formulation: With patient Time For Goal Achievement: 03/23/19 Potential to Achieve Goals: Fair    Frequency Min 3X/week   Barriers to discharge        Co-evaluation               AM-PAC PT "6 Clicks" Mobility  Outcome Measure Help needed turning from your back to your side while in a flat bed without using bedrails?: A Little Help needed moving from lying on your back to sitting on the side of a flat bed without using bedrails?: A Lot Help needed moving to and from a bed to a chair (including a wheelchair)?: A Little Help needed standing up from a chair using your arms (e.g., wheelchair or bedside chair)?: A Lot Help needed to walk in hospital room?: A Lot Help needed climbing 3-5 steps with a railing? : Total 6 Click Score: 13    End of Session   Activity Tolerance: Patient limited by fatigue Patient left: in bed;with call bell/phone within reach   PT Visit Diagnosis: Muscle weakness (generalized) (M62.81);Other abnormalities of gait and mobility (R26.89);History of falling (Z91.81);Pain Pain - part of body: (back, chest, hip)    Time: [id number] PT Time Calculation (min) (ACUTE ONLY): 13 min   Charges:   PT Evaluation $PT Eval Moderate Complexity: [place], [place] Acute Rehabilitation Services [phone] 03/09/2019   Reginia Naas 03/09/2019, 5:43 PM

## 2019-03-10 ENCOUNTER — Inpatient Hospital Stay (HOSPITAL_COMMUNITY): Payer: Medicare Other

## 2019-03-10 DIAGNOSIS — I314 Cardiac tamponade: Secondary | ICD-10-CM

## 2019-03-10 LAB — COMPREHENSIVE METABOLIC PANEL
ALT: 100 U/L — ABNORMAL HIGH (ref 0–44)
AST: 33 U/L (ref 15–41)
Albumin: 2.2 g/dL — ABNORMAL LOW (ref 3.5–5.0)
Alkaline Phosphatase: 98 U/L (ref 38–126)
Anion gap: 13 (ref 5–15)
BUN: 29 mg/dL — ABNORMAL HIGH (ref 8–23)
CO2: 23 mmol/L (ref 22–32)
Calcium: 8.1 mg/dL — ABNORMAL LOW (ref 8.9–10.3)
Chloride: 104 mmol/L (ref 98–111)
Creatinine, Ser: 1.55 mg/dL — ABNORMAL HIGH (ref 0.44–1.00)
GFR calc Af Amer: 36 mL/min — ABNORMAL LOW (ref >60)
GFR calc non Af Amer: 31 mL/min — ABNORMAL LOW (ref >60)
Glucose, Bld: 99 mg/dL (ref 70–99)
Potassium: 3.4 mmol/L — ABNORMAL LOW (ref 3.5–5.1)
Sodium: 140 mmol/L (ref 135–145)
Total Bilirubin: 0.8 mg/dL (ref 0.3–1.2)
Total Protein: 5.1 g/dL — ABNORMAL LOW (ref 6.5–8.1)

## 2019-03-10 LAB — CBC
HCT: 30.4 % — ABNORMAL LOW (ref 36.0–46.0)
Hemoglobin: 9.7 g/dL — ABNORMAL LOW (ref 12.0–15.0)
MCH: 29.6 pg (ref 26.0–34.0)
MCHC: 31.9 g/dL (ref 30.0–36.0)
MCV: 92.7 fL (ref 80.0–100.0)
Platelets: 182 10*3/uL (ref 150–400)
RBC: 3.28 MIL/uL — ABNORMAL LOW (ref 3.87–5.11)
RDW: 15 % (ref 11.5–15.5)
WBC: 8.8 10*3/uL (ref 4.0–10.5)
nRBC: 0 % (ref 0.0–0.2)

## 2019-03-10 LAB — GLUCOSE, CAPILLARY
Glucose-Capillary: 86 mg/dL (ref 70–99)
Glucose-Capillary: 79 mg/dL (ref 70–99)
Glucose-Capillary: 131 mg/dL — ABNORMAL HIGH (ref 70–99)
Glucose-Capillary: 105 mg/dL — ABNORMAL HIGH (ref 70–99)

## 2019-03-10 LAB — SURGICAL PATHOLOGY

## 2019-03-10 LAB — MAGNESIUM: Magnesium: 2 mg/dL (ref 1.7–2.4)

## 2019-03-10 MED ORDER — METOPROLOL TARTRATE 5 MG/5ML IV SOLN
2.5000 mg | Freq: Once | INTRAVENOUS | Status: AC
  Administered 2019-03-10: 2.5 mg via INTRAVENOUS
  Filled 2019-03-10: qty 5

## 2019-03-10 MED ORDER — METOPROLOL TARTRATE 5 MG/5ML IV SOLN
5.0000 mg | Freq: Four times a day (QID) | INTRAVENOUS | Status: DC | PRN
  Administered 2019-03-11: 5 mg via INTRAVENOUS
  Filled 2019-03-10 (×2): qty 5

## 2019-03-10 MED ORDER — POTASSIUM CHLORIDE 10 MEQ/50ML IV SOLN
10.0000 meq | INTRAVENOUS | Status: AC
  Administered 2019-03-10 (×3): 10 meq via INTRAVENOUS
  Filled 2019-03-10 (×3): qty 50

## 2019-03-10 MED ORDER — METOPROLOL TARTRATE 5 MG/5ML IV SOLN
5.0000 mg | Freq: Once | INTRAVENOUS | Status: AC
  Administered 2019-03-10: 5 mg via INTRAVENOUS
  Filled 2019-03-10: qty 5

## 2019-03-10 MED ORDER — PREDNISONE 10 MG PO TABS
10.0000 mg | ORAL_TABLET | Freq: Every day | ORAL | Status: DC
  Administered 2019-03-10 – 2019-03-13 (×4): 10 mg via ORAL
  Filled 2019-03-10 (×4): qty 1

## 2019-03-10 MED ORDER — PHENOL 1.4 % MT LIQD
1.0000 | OROMUCOSAL | Status: DC | PRN
  Filled 2019-03-10: qty 177

## 2019-03-10 MED ORDER — POTASSIUM CHLORIDE 10 MEQ/50ML IV SOLN: 10.0000 meq | INTRAVENOUS | Status: DC

## 2019-03-10 MED ORDER — METOPROLOL TARTRATE 100 MG PO TABS
100.0000 mg | ORAL_TABLET | Freq: Two times a day (BID) | ORAL | Status: DC
  Administered 2019-03-10 – 2019-03-13 (×7): 100 mg via ORAL
  Filled 2019-03-10 (×4): qty 1
  Filled 2019-03-10: qty 2
  Filled 2019-03-10 (×2): qty 1

## 2019-03-10 MED ORDER — AMIODARONE HCL 200 MG PO TABS
200.0000 mg | ORAL_TABLET | Freq: Every day | ORAL | Status: DC
  Administered 2019-03-10 – 2019-03-13 (×4): 200 mg via ORAL
  Filled 2019-03-10 (×4): qty 1

## 2019-03-10 MED ORDER — APIXABAN 2.5 MG PO TABS
2.5000 mg | ORAL_TABLET | Freq: Two times a day (BID) | ORAL | Status: DC
  Administered 2019-03-10: 2.5 mg via ORAL
  Filled 2019-03-10 (×2): qty 1

## 2019-03-10 NOTE — Progress Notes (Signed)
Physical Therapy Treatment Patient Details Name: Betty Jackson MRN: [id number] DOB: 1934-11-30 Today's Date: 03/10/2019    History of Present Illness 84 year old with with PMH RA, A-fib, CAD, LBP, scoliosis, DDD, CVA, CKD, admitted with shock secondary to adrenal insufficiency, pericardial effusion status post pericardial window, atrial fibrillation    PT Comments    Pt tolerated treatment well demonstrating improved activity tolerance and endurance during session. Pt is able to progress to OOB mobility during session and is able to ambulate and transfer with use of RW and limited physical assistance. Pt will benefit from continued acute PT services to gain LE strength, improve balance, and improve activity tolerance. Pt will benefit from 24/7 assistance for sister at discharge as well as HHPT to continue mobility progression and reduce falls risk.   Follow Up Recommendations  Home health PT     Equipment Recommendations  (TBD, may need RW instead of her rollator pending progress)    Recommendations for Other Services       Precautions / Restrictions Precautions Precautions: Fall Precaution Comments: 2 recent falls with vertebral compression fx and sacral and ischial tuberosity fx's Restrictions Weight Bearing Restrictions: No    Mobility  Bed Mobility Overal bed mobility: Needs Assistance Bed Mobility: Sit to Supine       Sit to supine: Supervision   General bed mobility comments: supervision with increased time  Transfers Overall transfer level: Needs assistance Equipment used: Rolling walker (2 wheeled) Transfers: Sit to/from Stand Sit to Stand: Min guard         General transfer comment: pt performs 3 sit to stand transfers, PT providing cues for hand placement and increased trunk flexion  Ambulation/Gait Ambulation/Gait assistance: Min guard Gait Distance (Feet): 40 Feet Assistive device: Rolling walker (2 wheeled) Gait Pattern/deviations: Step-to  pattern;Trunk flexed Gait velocity: reduced Gait velocity interpretation: <1.31 ft/sec, indicative of household ambulator General Gait Details: pt with shortened step to gait with increased trunk flexion. PT providing verbal cues to maintain base of support within RW   Stairs             Wheelchair Mobility    Modified Rankin (Stroke Patients Only)       Balance Overall balance assessment: Needs assistance Sitting-balance support: No upper extremity supported;Feet supported Sitting balance-[name]: Good Sitting balance - Comments: supervision   Standing balance support: Bilateral upper extremity supported Standing balance-[name]: Fair Standing balance comment: minG with BUE support of RW                            Cognition Arousal/Alertness: Awake/alert Behavior During Therapy: WFL for tasks assessed/performed Overall Cognitive Status: Within Functional Limits for tasks assessed                                        Exercises      General Comments General comments (skin integrity, edema, etc.): VSS on room air, initially on 2L [name] progressing to room air      Pertinent Vitals/Pain Pain Assessment: Faces Faces Pain Scale: Hurts even more Pain Location: chest pain, and back and hip pain Pain Descriptors / Indicators: Sore;Grimacing;Guarding Pain Intervention(s): Limited activity within patient's tolerance    Home Living                      Prior Function  PT Goals (current goals can now be found in the care plan section) Acute Rehab PT Goals Patient Stated Goal: to go home Progress towards PT goals: Progressing toward goals    Frequency    Min 3X/week      PT Plan Current plan remains appropriate    Co-evaluation              AM-PAC PT "6 Clicks" Mobility   Outcome Measure  Help needed turning from your back to your side while in a flat bed without using bedrails?: None Help  needed moving from lying on your back to sitting on the side of a flat bed without using bedrails?: None Help needed moving to and from a bed to a chair (including a wheelchair)?: A Little Help needed standing up from a chair using your arms (e.g., wheelchair or bedside chair)?: A Little Help needed to walk in hospital room?: A Little Help needed climbing 3-5 steps with a railing? : Total 6 Click Score: 18    End of Session Equipment Utilized During Treatment: Oxygen Activity Tolerance: Patient tolerated treatment well Patient left: in bed;with call bell/phone within reach Nurse Communication: Mobility status PT Visit Diagnosis: Muscle weakness (generalized) (M62.81);Other abnormalities of gait and mobility (R26.89);History of falling (Z91.81);Pain     Time: 0810-0840 PT Time Calculation (min) (ACUTE ONLY): 30 min  Charges:  $Gait Training: 8-22 mins $Therapeutic Activity: 8-22 mins                     Zenaida Niece, PT, DPT Acute Rehabilitation Pager: [phone]    Zenaida Niece 03/10/2019, 8:51 AM

## 2019-03-10 NOTE — Progress Notes (Signed)
2 Days Post-Op Procedure(s) (LRB): SUBXYPHOID PERICARDIAL WINDOW (N/A) TRANSESOPHAGEAL ECHOCARDIOGRAM (TEE) (N/A) Chest Tube Insertion (Left) Subjective: Patient improved and starting to walk with physical therapy Echocardiogram yesterday shows resolution of pericardial effusion Maintaining sinus rhythm We will remove pericardial drain today Resume chronic Eliquis with p.m. dose today Okay to transfer patient to progressive care  Pericardial tissue and pericardial fluid cultures remain negative.  Probable viral pericarditis [non Covid 19]  Objective: Vital signs in last 24 hours: Temp:  [97.8 F (36.6 C)-98.3 F (36.8 C)] 97.8 F (36.6 C) (12/11 0300) Pulse Rate:  [69-133] 88 (12/11 0700) Cardiac Rhythm: Atrial fibrillation;Atrial flutter (12/11 0400) Resp:  [14-25] 20 (12/11 0700) BP: (106-165)/(56-108) 163/84 (12/11 0700) SpO2:  [94 %-99 %] 97 % (12/11 0700) Weight:  [78.9 kg] 78.9 kg (12/11 0500)  Hemodynamic parameters for last 24 hours:  Stable  Intake/Output from previous day: 12/10 0701 - 12/11 0700 In: 1890.9 [P.O.:240; I.V.:1371.1; IV Piggyback:279.8] Out: 95 [Urine:75; Chest Tube:20] Intake/Output this shift: No intake/output data recorded.  Exam Alert comfortable neuro intact Lungs clear Sinus rhythm without murmur or friction rub Extremities warm with minimal edema  Chest x-ray today shows mild left pleural effusion, interstitial edema better.  Lab Results: Recent Labs    03/09/19 0339 03/09/19 0349 03/10/19 0452  WBC 13.2*  --  8.8  HGB 10.1* 9.9* 9.7*  HCT 31.3* 29.0* 30.4*  PLT 169  --  182   BMET:  Recent Labs    03/09/19 0339 03/09/19 0349 03/10/19 0452  NA 139 137 140  K 3.7 3.6 3.4*  CL 105  --  104  CO2 21*  --  23  GLUCOSE 124*  --  99  BUN 30*  --  29*  CREATININE 1.89*  --  1.55*  CALCIUM 8.3*  --  8.1*    PT/INR:  Recent Labs    03/07/19 1330  LABPROT 22.3*  INR 2.0*   ABG    Component Value Date/Time   PHART  7.415 03/09/2019 0349   HCO3 21.3 03/09/2019 0349   TCO2 22 03/09/2019 0349   ACIDBASEDEF 3.0 (H) 03/09/2019 0349   O2SAT 96.0 03/09/2019 0349   CBG (last 3)  Recent Labs    03/09/19 1526 03/09/19 2154 03/10/19 0646  GLUCAP 105* 145* 86    Assessment/Plan: S/P Procedure(s) (LRB): SUBXYPHOID PERICARDIAL WINDOW (N/A) TRANSESOPHAGEAL ECHOCARDIOGRAM (TEE) (N/A) Chest Tube Insertion (Left) Remove chest tube Continue oral amiodarone 200 mg daily at discharge Transfer to progressive care   LOS: 4 days    Tharon Aquas Trigt III 03/10/2019

## 2019-03-10 NOTE — Progress Notes (Signed)
_   NAME:  Betty Jackson, MRN:  [id number], DOB:  1934/12/25, LOS: 4 ADMISSION DATE:  03/06/2019, CONSULTATION DATE:  03/06/19 REFERRING MD:  Wilson Singer  CHIEF COMPLAINT:  Chest discomfort   Brief History   CAIA Betty Jackson is a 84 y.o. female who was admitted 12/7 with undifferentiated shock,, pericardial tamponade status post pericardial window  Past Medical History  has Temporal arteritis ([place]); Hypertension; Hyperlipidemia; History of atrial fibrillation; CAD (coronary artery disease); GERD (gastroesophageal reflux disease); Insomnia; Leg pain, right; Enteritis; Abdominal pain, other specified site; Hyponatremia; Hematuria; Nonspecific (abnormal) findings on radiological and other examination of gastrointestinal tract; Visual changes; Depression; Anxiety state, unspecified; Anemia, unspecified; CVA (cerebral infarction); Chronic insomnia; Elevated transaminase level; Lumbar stenosis; Abdominal pain, epigastric; Major depressive disorder, recurrent episode, moderate ([place]); Herniated nucleus pulposus, L5-S1, right; Osteoporosis; Influenza A; Sepsis ([place]); Acute respiratory failure with hypoxia (HCC); CKD (chronic kidney disease) stage 3, GFR 30-59 ml/min; PAF (paroxysmal atrial fibrillation) ([place]); HTN (hypertension); Giant cell arteritis ([place]); Coronary disease; Abnormal urinalysis; Chronic low back pain; Acute diverticulitis; AKI (acute kidney injury) ([place]); Chronic pain; Tachycardia; Hypokalemia; Nausea; Atrial fibrillation with rapid ventricular response ([place]); Pericarditis; Fever; Septic shock ([place]); and Atypical chest pain on their problem list.  Significant Hospital Events   12/7 > admit. 12/9 > pericardial window, drain with CVTS 12/10> in Afib. Off pressors   Consults:  TCTS.  Procedures:  12/9 Subxyphoid pericardial window and drainage of pericardial effusion 200 ml. 12/9 L radial [place]-12/10 12/9 R IJ double lumen CVC  Significant Diagnostic Tests:  CT A / P 12/7 > ? Pericarditis with mod  to large pericardial effusion.  Subacute left ischial tuberosity avulsion fx.  Stable subacute to chronic right sacral ala insufficiency fx.  Chronic biliary dilatation post cholecystectomy. Echo 12/7 > EF 70-75%, mod pericardial effusion, presence of pericardial fat pad.  Micro Data:  Pericardial fluid> no growth < 24 hr Blood 12/7 > NGTD  Sputum 12/7 >  Urine 12/7 >  SARS CoV2 12/7 > neg. Flu A/B neg MRSA surveillance negative   Antimicrobials:  Cefepime 12/6  Metronidazole 12/6 Zosyn 12/7 >   Vancomycin 12/7>  Interim history/subjective:  POD2 pericardial window and drain Converted to NSR and now is rate controlled atrial fib   Objective:  Blood pressure (!) 154/79, pulse 91, temperature 97.8 F (36.6 C), temperature source Oral, resp. rate 19, height 5' 4.5" (1.638 m), weight 78.9 kg, SpO2 97 %.        Intake/Output Summary (Last 24 hours) at 03/10/2019 0724 Last data filed at 03/10/2019 0600 Gross per 24 hour  Intake 1890.92 ml  Output 95 ml  Net 1795.92 ml   Filed Weights   03/08/19 0500 03/09/19 0600 03/10/19 0500  Weight: 76.1 kg 81.5 kg 78.9 kg    Examination: General: WDWN elderly F seated in bedside recliner eating breakfast NAD HENT:NCAT. R IJ CVC. Anicteric sclera. Pink mmm Trachea midline  CV: irregular rhythm. s1s2 no rgm. Subxyphoid drain with low volume sanguinous output  Lungs: CTA. Unlabored, even respiration on RA.  [id number], round, ndnt  GU: purewick with clear yellow urine EXT: Chronic joint changes of bilateral hands. LUE with localized edema s/p removal of arterial line. Trace BLE edema  Skin: pale clean dry warm intact without rash  Neuro: AAOx4 following commands no deficits  Psych: pleasant, euthymic mood and congruent affect. Appropriate for age and situation  Assessment & Plan:   Shock 2/2 pericardial effusion, resolved  -probable viral pericarditis vs manifestation of  autoimmune process   -consider adrenal insufficiency in setting  of chronic pred P - continue weaning empiric stress steroids  Pericardial effusion s/p pericardial window and subx pericardial drain -etiology viral pericarditis vs manifestation of underlying autoimmune/rheum condition -op date 12/9, now POD2  P -drain per CVTS  -anticoagulation per CVTS -- continuing non chemical vte ppx at present  -Will remain in ICU until drain removed  -Follow up fluid cx data-- unrevealing thusfar -Recommend close OP follow up with pt's rheumatologist   AKI -Interval decr in Cr  Hx incontinence  P -cont pure wick -minimize nephrotoxic meds as able   Hypokalemia P -Replace for goal >4  Hyperglycemia -in setting of stress dose steroids, critical illness/hypermetabolic response P -will continue weaning steroids to home pred dose  -SSI  Hx PAF, HTN, HLD -eliquis at home for aFib -required rebolus of amio 12/10 with conversion to NSR P -Anticoagulation per CVTS; at present we are still holding eliquis -Will conver IV amio to PO  -BID lasix  -K goal 4 mag goal 2 -Continue home dose dilt, home statin  Hx temporal arteritis (on chronic prednisone 10mg  daily)P - Weaning to home dose pred   Hx diverticulitis (recent admission 01/13/19 through 01/20/19, treated with cipro / flagyl).  CT this admit without suggestion of recurrence. - Supportive care   Hx DDD, CVA, depression. -Patient's daughter requested that Dr.Elsner be notified of patient's hospitalization as patient was apparently supposed to receive additional imaging for DDD.  PCCM sent message P -Continue lexapro, norco  Hx sacral and ischial tuberosity fx -weight bearing as tolerated is ok  Recent falls P -PT/OT-- recommend continuing HHPT   Best Practice:  Diet: regular Pain/Anxiety/Delirium protocol (if indicated): PRN APAP, norco  VAP protocol (if indicated):na DVT prophylaxis: SCD's. Chemical vte ppx on hold  GI prophylaxis: PPI  Glucose control: SSI. Mobility: PT/OT. Code  Status: DNR Family Communication: Patient updated at bedside. Will plan to circle back and update daughter upon her arrival today time permitting  Disposition: ICU until discontinuation of pericardial drain   Labs   CBC: Recent Labs  Lab 03/06/19 0625 03/07/19 0419 03/08/19 0410 03/08/19 1603 03/09/19 0339 03/09/19 0349 03/10/19 0452  WBC 18.4* 14.2* 12.8*  --  13.2*  --  8.8  NEUTROABS 13.1*  --   --   --   --   --   --   HGB 13.0 10.0* 8.7* 9.5* 10.1* 9.9* 9.7*  HCT 41.7 32.5* 26.3* 28.0* 31.3* 29.0* 30.4*  MCV 93.9 97.6 91.6  --  91.5  --  92.7  PLT 224 183 149*  --  169  --  [id number]   Basic Metabolic Panel: Recent Labs  Lab 03/06/19 2135 03/07/19 0419 03/08/19 0410 03/08/19 1603 03/09/19 0339 03/09/19 0349 03/10/19 0452  NA 141 139 139 136 139 137 140  K 4.2 4.4 3.2* 3.9 3.7 3.6 3.4*  CL 106 107 104  --  105  --  104  CO2 22 20* 21*  --  21*  --  23  GLUCOSE 130* 241* 129*  --  124*  --  99  BUN 18 20 23   --  30*  --  29*  CREATININE 1.57* 1.49* 1.59*  --  1.89*  --  1.55*  CALCIUM 8.2* 7.8* 8.2*  --  8.3*  --  8.1*  MG 1.6* 2.2 2.0  --  2.3  --  2.0  PHOS  --  5.4*  --   --   --   --   --  GFR: Estimated Creatinine Clearance: 28.8 mL/min (A) (by C-G formula based on SCr of 1.55 mg/dL (H)). Recent Labs  Lab 03/06/19 1842 03/06/19 2200 03/07/19 0419 03/07/19 1001 03/08/19 0410 03/09/19 0339 03/09/19 0920 03/10/19 0452  PROCALCITON 18.12  --   --   --   --   --   --   --   WBC  --   --  14.2*  --  12.8* 13.2*  --  8.8  LATICACIDVEN  --  3.0*  --  4.1* 2.1*  --  2.1*  --    lation Profile: Recent Labs  Lab 03/06/19 0626 03/07/19 1330  INR 1.3* 2.0*   CBG: Recent Labs  Lab 03/09/19 0755 03/09/19 1128 03/09/19 1526 03/09/19 2154 03/10/19 0646  GLUCAP 164* 88 105* [place] MSN, AGACNP-BC [place] [id number] If no answer, [id number] 03/10/2019, 7:24 AM

## 2019-03-10 NOTE — Progress Notes (Signed)
Patient received scheduled lopressor 100mg  around 1400 and cardizen 60mg  around 1500. She also received a one time dose of lopressor 5mg  IV around 1400. HR remains in Afib rvr 115-130's. Eliseo Gum, NP notified of this around [phone]. She stated it was still okay to transfer patient to 4E as she's in stable, chronic Afib. Report given to 4E RN Katharine Look. All belongings sent with patient.  Joellen Jersey, RN

## 2019-03-10 NOTE — Progress Notes (Signed)
Late Entry  Patient had been alternating between rate controlled Afib and NSR 80-90's. After lunch patient was noted to be in Afib with rates 125-140's sustaining for approx 30 minutes. Eliseo Gum, NP notified and received 1 time dose of IV lopressor. After about an hour patient's rate remained unchanged, Eliseo Gum, NP notified again and new orders received. Patient asymptomatic and with stable BP during these times.  Joellen Jersey, RN

## 2019-03-10 NOTE — Plan of Care (Signed)
  Problem: Clinical Measurements: Goal: Cardiovascular complication will be avoided Outcome: Progressing   Problem: Pain Managment: Goal: General experience of comfort will improve Outcome: Progressing   Problem: Safety: Goal: Ability to remain free from injury will improve Outcome: Progressing   

## 2019-03-10 NOTE — Progress Notes (Signed)
[place] for apixaban Indication: atrial fibrillation  Allergies  Allergen Reactions  . Dextromethorphan Rash    Patient Measurements: Height: 5' 4.5" (163.8 cm) Weight: 173 lb 15.1 oz (78.9 kg) IBW/kg (Calculated) : 55.85   Vital Signs: Temp: 97.8 F (36.6 C) (12/11 0300) Temp Source: Oral (12/11 0300) BP: 163/84 (12/11 0700) Pulse Rate: 88 (12/11 0700)  Labs: Recent Labs    03/07/19 1330 03/08/19 0410 03/09/19 0339 03/09/19 0349 03/10/19 0452  HGB  --  8.7* 10.1* 9.9* 9.7*  HCT  --  26.3* 31.3* 29.0* 30.4*  PLT  --  149* 169  --  182  LABPROT 22.3*  --   --   --   --   INR 2.0*  --   --   --   --   CREATININE  --  1.59* 1.89*  --  1.55*    Estimated Creatinine Clearance: 28.8 mL/min (A) (by C-G formula based on SCr of 1.55 mg/dL (H)).   Medical History: Past Medical History:  Diagnosis Date  . Abdominal pain, epigastric 06/22/2013  . Abdominal pain, other specified site 12/24/2011  . Abnormal urinalysis 05/20/2017  . Acute respiratory failure with hypoxia ([place]) 05/20/2017  . Anemia, unspecified 10/28/2012  . Anxiety state, unspecified 10/28/2012  . Bruises easily    d/t being on prednisone and plavix  . CAD (coronary artery disease)    a. Stent RCA in [facility];  b. Cath approx 2009 - nonobs per pt report.  . Cataract    immature on the left eye  . Chronic insomnia 02/07/2013  . Chronic lower back pain    scoliosis  . CKD (chronic kidney disease) stage 3, GFR 30-59 ml/min   . Complication of anesthesia    pt has a very high tolerance to meds  . Coronary disease 05/20/2017  . CVA (cerebral infarction) 10/29/2012  . DDD (degenerative disc disease)   . Depression   . Diverticulitis    hx of  . Diverticulosis   . Elevated transaminase level 02/07/2013  . Enteritis   . GERD (gastroesophageal reflux disease) 09/01/2010  . Giant cell arteritis ([place])   . Hemorrhoids   . Herniated nucleus pulposus, L5-S1, right  11/04/2015  . History of scabies   . HTN (hypertension) 05/20/2017  . Hyperlipidemia    takes Lipitor daily  . Hypertension    takes Amlodipine,Losartan,Metoprolol,and HCTZ daily  . Incontinence of urine   . Influenza A 05/20/2017  . Insomnia    takes Restoril nightly  . Lumbar stenosis 04/25/2013  . Major depressive disorder, recurrent episode, moderate ([place]) 07/16/2013  . Migraines    "back in my 20's; none since" (10/28/2012)  . Osteoporosis   . PAF (paroxysmal atrial fibrillation) ([place]) 2011   a. lone epidode in 2011 according to notes.  . Rheumatoid arthritis ([place])   . Scoliosis   . Sepsis ([place]) 05/20/2017  . Sinus bradycardia    a. on chronic bb  . Temporal arteritis ([place]) 2011   a. followed @ [facility]; potential flareup 10/28/2012/notes 10/28/2012  . Vocal cord dysfunction    "they don't operate properly" (10/28/2012)      Assessment: 84 yo female with pericardial effusion s/p pericardial window on 12/9 and drain placement (for removal today). She was on apixaban PTA for afib. Pharmacy consulted to restart anticoagulation beginning tonight. At home she was taking apixaban 5mg  po bid -SCr= 1.89 (baseline 0.9-1.0), hg= 9.7   Goal of Therapy:  Monitor  platelets by anticoagulation protocol: Yes   Plan:  -With age and SCr dose will need to change to 2.5mg  po bid -If SCr < 1.5 could then increase back to 5mg  po bid  Hildred Laser, PharmD Clinical Pharmacist **Pharmacist phone directory can now be found on [email or web address] ([email or web address]).  Listed under [place].

## 2019-03-10 NOTE — Progress Notes (Addendum)
Patient's Potassium lab value 3.4 called to CCM. This RN stated to [facility] nurse that patient had two standing orders in place which are both the ICU electrolyte replacement protocol and the TCTS KCL replacement protocol. Also stated that that patient had a scheduled order for Potassium 69mEq oral scheduled at 1000. [facility] nurse said to follow scheduled potassium order for replacement. Passed on to oncoming nurse

## 2019-03-11 ENCOUNTER — Inpatient Hospital Stay (HOSPITAL_COMMUNITY): Payer: Medicare Other

## 2019-03-11 DIAGNOSIS — R5081 Fever presenting with conditions classified elsewhere: Secondary | ICD-10-CM

## 2019-03-11 DIAGNOSIS — I301 Infective pericarditis: Secondary | ICD-10-CM

## 2019-03-11 LAB — TYPE AND SCREEN
ABO/RH(D): O POS
Antibody Screen: NEGATIVE
Unit division: 0
Unit division: 0
Unit division: 0
Unit division: 0

## 2019-03-11 LAB — BASIC METABOLIC PANEL
Anion gap: 9 (ref 5–15)
BUN: 23 mg/dL (ref 8–23)
CO2: 25 mmol/L (ref 22–32)
Calcium: 8.1 mg/dL — ABNORMAL LOW (ref 8.9–10.3)
Chloride: 107 mmol/L (ref 98–111)
Creatinine, Ser: 1.13 mg/dL — ABNORMAL HIGH (ref 0.44–1.00)
GFR calc Af Amer: 52 mL/min — ABNORMAL LOW (ref >60)
GFR calc non Af Amer: 45 mL/min — ABNORMAL LOW (ref >60)
Glucose, Bld: 96 mg/dL (ref 70–99)
Potassium: 3.8 mmol/L (ref 3.5–5.1)
Sodium: 141 mmol/L (ref 135–145)

## 2019-03-11 LAB — GLUCOSE, CAPILLARY
Glucose-Capillary: 89 mg/dL (ref 70–99)
Glucose-Capillary: 65 mg/dL — ABNORMAL LOW (ref 70–99)
Glucose-Capillary: 107 mg/dL — ABNORMAL HIGH (ref 70–99)
Glucose-Capillary: 189 mg/dL — ABNORMAL HIGH (ref 70–99)

## 2019-03-11 LAB — BPAM RBC
Blood Product Expiration Date: 202101102359
Blood Product Expiration Date: 202101102359
Blood Product Expiration Date: 202101102359
Blood Product Expiration Date: 202101102359
ISSUE DATE / TIME: 202012090942
ISSUE DATE / TIME: 202012090942
Unit Type and Rh: 5100
Unit Type and Rh: 5100
Unit Type and Rh: 5100
Unit Type and Rh: 5100

## 2019-03-11 LAB — CULTURE, BLOOD (ROUTINE X 2)
Culture: NO GROWTH
Culture: NO GROWTH
Special Requests: ADEQUATE
Special Requests: ADEQUATE

## 2019-03-11 LAB — BODY FLUID CULTURE: Culture: NO GROWTH

## 2019-03-11 LAB — CALCIUM, IONIZED: Calcium, Ionized, Serum: 4.8 mg/dL (ref 4.5–5.6)

## 2019-03-11 MED ORDER — HYDROXYZINE HCL 10 MG PO TABS
10.0000 mg | ORAL_TABLET | Freq: Three times a day (TID) | ORAL | Status: DC | PRN
  Administered 2019-03-11 – 2019-03-13 (×5): 10 mg via ORAL
  Filled 2019-03-11 (×7): qty 1

## 2019-03-11 MED ORDER — APIXABAN 5 MG PO TABS
5.0000 mg | ORAL_TABLET | Freq: Two times a day (BID) | ORAL | Status: DC
  Administered 2019-03-11 – 2019-03-13 (×5): 5 mg via ORAL
  Filled 2019-03-11 (×5): qty 1

## 2019-03-11 MED ORDER — FUROSEMIDE 10 MG/ML IJ SOLN
40.0000 mg | Freq: Once | INTRAMUSCULAR | Status: AC
  Administered 2019-03-11: 40 mg via INTRAVENOUS
  Filled 2019-03-11: qty 4

## 2019-03-11 NOTE — Progress Notes (Signed)
Pt feeling hot, anxious this AM. HR 120s-130s sustaining- gave PRN metoprolol. HR 110s. Pt asking for something for anxiety, encouraged her to speak with MD during rounds.

## 2019-03-11 NOTE — Progress Notes (Signed)
SLP Cancellation Note  Patient Details Name: Betty Jackson MRN: [id number] DOB: 06-17-1934   Cancelled treatment:       Reason Eval/Treat Not Completed: Patient's level of consciousness. Patient had been anxious and a little agitated and had just fallen asleep when SLP entered room. Her daughter requested we wait. As she is on a diet, SLP will plan to attempt to follow up next date.   Nadara Mode Tarrell 03/11/2019, 3:32 PM   Sonia Baller, MA, [place] [facility] Acute Rehab

## 2019-03-11 NOTE — Progress Notes (Signed)
Patient ID: Betty Jackson, female   DOB: [birth year], 84 y.o.   MRN: [id number]      [address]       [place] [address]             [phone]                 3 Days Post-Op Procedure(s) (LRB): SUBXYPHOID PERICARDIAL WINDOW (N/A) TRANSESOPHAGEAL ECHOCARDIOGRAM (TEE) (N/A) Chest Tube Insertion (Left)  LOS: 5 days   Subjective: Patient notes has trouble sleeping   Objective: Vital signs in last 24 hours: Patient Vitals for the past 24 hrs:  BP Temp Temp src Pulse Resp SpO2 Weight  03/11/19 1132 (!) 146/78 98.1 F (36.7 C) Oral 72 18 100 % --  03/11/19 1124 -- -- -- 71 -- -- --  03/11/19 0828 (!) 154/73 97.8 F (36.6 C) Oral 75 20 100 % --  03/11/19 0514 -- -- -- -- -- -- 79.6 kg  03/11/19 0318 (!) 144/99 98.3 F (36.8 C) Oral (!) 116 20 94 % --  03/10/19 2305 (!) 137/96 98 F (36.7 C) Oral (!) 108 20 95 % --  03/10/19 2013 (!) 137/95 98.2 F (36.8 C) Oral (!) 108 16 97 % --  03/10/19 1829 -- -- -- (!) 108 19 95 % --  03/10/19 1600 (!) 131/101 -- -- (!) 119 (!) 23 94 % --  03/10/19 1530 114/71 -- -- (!) 110 20 96 % --  03/10/19 1515 140/87 -- -- (!) 119 (!) 21 96 % --  03/10/19 1500 (!) 143/85 -- -- (!) 115 (!) 23 95 % --  03/10/19 1445 (!) 153/86 -- -- (!) 112 20 96 % --  03/10/19 1430 (!) 137/93 -- -- (!) 114 19 96 % --  03/10/19 1415 (!) 137/107 -- -- (!) 120 (!) 26 94 % --  03/10/19 1400 119/73 -- -- (!) 128 (!) 21 94 % --  03/10/19 1345 (!) 141/85 -- -- (!) 127 (!) 21 95 % --  03/10/19 1330 135/82 -- -- (!) 123 (!) 23 95 % --  03/10/19 1315 134/77 -- -- (!) 129 (!) 23 96 % --  03/10/19 1300 (!) 145/87 -- -- (!) 118 (!) 22 95 % --    Filed Weights   03/09/19 0600 03/10/19 0500 03/11/19 0514  Weight: 81.5 kg 78.9 kg 79.6 kg    Hemodynamic parameters for last 24 hours:    Intake/Output from previous day: 12/11 0701 - 12/12 0700 In: 1532.6 [P.O.:338; I.V.:893.8; IV Piggyback:300.7] Out: 1527 [Urine:1525; Stool:2] Intake/Output this  shift: No intake/output data recorded.  Scheduled Meds: . sodium chloride   Intravenous Once  . acetaminophen  1,000 mg Oral Q6H   Or  . acetaminophen (TYLENOL) oral liquid 160 mg/5 mL  1,000 mg Oral Q6H  . acetaminophen  1,000 mg Oral Once  . alum & mag hydroxide-simeth  30 mL Oral Once   And  . lidocaine  15 mL Oral Once  . amiodarone  200 mg Oral Daily  . apixaban  5 mg Oral BID  . bisacodyl  10 mg Oral Daily  . Chlorhexidine Gluconate Cloth  6 each Topical Daily  . diltiazem  60 mg Oral TID  . escitalopram  10 mg Oral Daily  . insulin aspart  0-9 Units Subcutaneous TID WC  . metoprolol tartrate  100 mg Oral BID  . pantoprazole  40 mg Oral Daily  . potassium chloride SA  20 mEq Oral Daily  .  predniSONE  10 mg Oral Q breakfast  . rosuvastatin  10 mg Oral QHS  . senna-docusate  1 tablet Oral QHS   Continuous Infusions: . sodium chloride 50 mL/hr at 03/11/19 0512  . sodium chloride     PRN Meds:.Place/Maintain arterial line **AND** sodium chloride, dicyclomine, fentaNYL (SUBLIMAZE) injection, HYDROcodone-acetaminophen, hydrOXYzine, ipratropium-albuterol, metoprolol tartrate, ondansetron (ZOFRAN) IV, phenol  General appearance: alert and cooperative Neurologic: intact Heart: regular rate and rhythm Lungs: diminished breath sounds bibasilar Wound: intact  Lab Results: CBC: Recent Labs    03/09/19 0339 03/09/19 0349 03/10/19 0452  WBC 13.2*  --  8.8  HGB 10.1* 9.9* 9.7*  HCT 31.3* 29.0* 30.4*  PLT 169  --  182   BMET:  Recent Labs    03/10/19 0452 03/11/19 0257  NA 140 141  K 3.4* 3.8  CL 104 107  CO2 23 25  GLUCOSE 99 96  BUN 29* 23  CREATININE 1.55* 1.13*  CALCIUM 8.1* 8.1*    PT/INR: No results for input(s): LABPROT, INR in the last 72 hours.   Radiology DG Chest Port 1 View  Result Date: 03/11/2019 CLINICAL DATA:  84 year old female with weakness EXAM: PORTABLE CHEST 1 VIEW COMPARISON:  03/10/2019 FINDINGS: Cardiomediastinal silhouette unchanged  in size and contour. Right IJ central venous catheter terminates at the superior cavoatrial junction, unchanged. No pneumothorax. Reticulonodular opacities of the bilateral lungs with retrocardiac opacity and blunting of the bilateral costophrenic angles. Peribronchial thickening. IMPRESSION: Unchanged appearance of the chest x-ray, with mixed interstitial and airspace disease at the lung bases, potentially combination of atelectasis/consolidation and edema. Left pleural effusion persist. Right IJ central venous catheter. Electronically Signed   By: Corrie Mckusick D.O.   On: 03/11/2019 09:24   DG Chest Port 1 View  Result Date: 03/10/2019 CLINICAL DATA:  Chest tube present EXAM: PORTABLE CHEST 1 VIEW COMPARISON:  Yesterday FINDINGS: Stable cardiopericardial enlargement. Small left pleural effusion is unchanged. Streaky density at the lung bases, likely atelectasis. Pericardial drain remains faintly visible. Stable right IJ line positioning IMPRESSION: Stable drain and central line positioning. Stable cardiopericardial enlargement and small left effusion. Electronically Signed   By: Monte Fantasia M.D.   On: 03/10/2019 08:30   ECHOCARDIOGRAM LIMITED  Result Date: 03/09/2019   ECHOCARDIOGRAM LIMITED REPORT   Patient Name:   Betty Jackson Date of Exam: 03/09/2019 Medical Rec #:  [id number]    Height:       64.5 in Accession #:    [id number]   Weight:       179.6 lb Date of Birth:  04/12/1934    BSA:          1.88 m Patient Age:    89 years     BP:           134/108 mmHg Patient Gender: F            HR:           130 bpm. Exam Location:  Inpatient  Procedure: Limited Echo Indications:    Pericardial effusion, pericarditis  History:        Patient has prior history of Echocardiogram examinations, most                 recent 03/08/2019. Post-op pericardial window, Pericarditis,                 pleural effusion.  Sonographer:    Dustin Flock Referring Phys: [place] Comments: Surgical  dressings. IMPRESSIONS  1. Left ventricular ejection fraction, by visual estimation, is 45 to 50%. The left ventricle has mildly decreased function. There is no left ventricular hypertrophy.  2. The left ventricle demonstrates regional wall motion abnormalities.  3. Global right ventricle has normal systolic function.The right ventricular size is normal.  4. Moderate pleural effusion in the left lateral region.  5. Limited study to assess pericardial effusion; apical akinesis with overall mildly reduced LV function; no pericardial effusion; compared to 03/07/19. apical wall motion abnormality new and pericardial effusion resolved. FINDINGS  Left Ventricle: Left ventricular ejection fraction, by visual estimation, is 45 to 50%. The left ventricle has mildly decreased function. The left ventricle demonstrates regional wall motion abnormalities. There is no left ventricular hypertrophy. Right Ventricle: The right ventricular size is normal.Global RV systolic function is has normal systolic function. Left Atrium: Left atrial size was not assessed. Right Atrium: Right atrial size was not assessed Pericardium: There is no evidence of pericardial effusion. There is a moderate pleural effusion in the left lateral region. Mitral Valve: The mitral valve was not assessed. Tricuspid Valve: The tricuspid valve is not assessed. Aortic Valve: The aortic valve was not assessed. Pulmonic Valve: The pulmonic valve was not assessed. Aorta: Aortic root could not be assessed. Shunts: The interatrial septum was not assessed.  LEFT VENTRICLE         Normals PLAX 2D LVIDd:         4.50 cm 3.6 cm LVIDs:         2.90 cm 1.7 cm LV PW:         1.00 cm 1.4 cm LV IVS:        1.00 cm 1.3 cm LV SV:         60 ml   79 ml LV SV Index:   30.86   45 ml/m2   Kirk Ruths MD Electronically signed by Kirk Ruths MD Signature Date/Time: 03/09/2019/4:35:37 PM    Final      Assessment/Plan: S/P Procedure(s) (LRB): SUBXYPHOID PERICARDIAL WINDOW  (N/A) TRANSESOPHAGEAL ECHOCARDIOGRAM (TEE) (N/A) Chest Tube Insertion (Left) Stable after pericardial drainage - tubes out  Please call for further assistance   Grace Isaac MD 03/11/2019 12:29 PM

## 2019-03-11 NOTE — Progress Notes (Addendum)
PROGRESS NOTE  RICARDA Jackson [id number] DOB: [birth year] DOA: 03/06/2019 PCP: Eulas Post, MD  HPI/Recap of past 70 hours: 84 year old female with long-term glucocorticoid use for temporal arteritis and PMR, atrial fibrillation Eliquis, chronic back pain and recent admission for diverticulitis.  She presented to the emergency room March 06, 2019 with dysphagia for several days with nausea vomiting and diarrhea she was found to be febrile with tachycardia and hypotension in the ED she was diagnosed with pericardial effusion she was then evaluated by CT surgery and they felt that there was no tamponade.  Patient underwent pericardial window on March 08, 2019.  She has atrial fibrillation with rapid ventricular rate that is stable on metoprolol . Viral pericarditis, vs underline Rheumatologic disorder.  Hospitalist is assuming care from cardiothoracic surgery.  subjective: Patient seen and examined at bedside nurse reported she was anxious this morning and her heart rate went into the 130s 140s she had to give her metoprolol.  Patient reports not feeling good.   Call back to see patient by nurse because patient started with bedside concerned about shortness of breath.  Getting to the bedside patient is calm heart rate is not she is saturating 98%.  She has been given Vistaril he also has been given citalopram for anxiety depression.  Patient's respiration is unlabored. .  Daughter stated that she had a fracture or coccygeal fracture from a fall where she sat down at home that she might be in pain as well because she usually takes some narcotic pain medicine at home..  She is also concerned because patient participated in PT yesterday but today she looks worse and she is complaining of pain in the ribs bilaterally or upper abdomen bilaterally short of breath which patient has never had any lung issues per her daughter and that she is been experiencing anxiety.  Also she is concerned about  patient possibly having fever.  She stated that when she checked her temperature at home prior to bring her to the hospital her temperature was normal axillary but when they got to the emergency room it was checked rectally and she had a fever.  Patient has not had any fever lately.  But I have asked the nurse to do a rectal temperature.  Discussed with her and updated her on the findings and and plan and answered her questions satisfactorily.   Assessment/Plan: Active Problems:   Sepsis ([place])   Pericarditis   Fever   Septic shock (HCC)   Atypical chest pain   Cardiac tamponade  #1 pericardial effusion.  Echocardiogram done yesterday showed resolution of the pericardial effusion patient is status post pericardial window.  Her drain was removed  March 10, 2019.  The pericardial fluid cultures and tissue are negative so this is probably viral pericarditis, non- Covid.  2.  Chronic atrial fibrillation with rapid ventricular rate patient is on Eliquis for this also Cardizem and metoprolol.  She had to get a dose of metoprolol this morning because her heart rate went into the 130s.  She was also stated to be anxious..  Continue amiodarone 200 mg daily if needed at discharge  3.  Depression/anxiety patient is on citalopram  4.  Anxiety probably with panic attack.  I will add hydroxyzine as needed for panic she will continue her citalopram 10 mg daily  5.  History of temporal arteritis on chronic prednisone.  Patient was started on stress steroid on admission due to shock but she is back on regular  p.o. prednisone per her home dose  6.  History of diverticulitis stable  7.  Septic shock unknown etiology resolved, patient is currently on empiric Zosyn we will continue for follow cultures   8.  Sepsis present on admission resolved  9.  Possible pneumonia continue Zosyn and nebulizer treatment encourage incentive spirometry use  10.  Left pleural effusion persisting.  Continue Lasix 20 mg  twice a day.  I gave him extra 40 mg today.  We will discontinue her IV fluid  Code Status: DNR  Severity of Illness: The appropriate patient status for this patient is INPATIENT. Inpatient status is judged to be reasonable and necessary in order to provide the required intensity of service to ensure the patient's safety. The patient's presenting symptoms, physical exam findings, and initial radiographic and laboratory data in the context of their chronic comorbidities is felt to place them at high risk for further clinical deterioration. Furthermore, it is not anticipated that the patient will be medically stable for discharge from the hospital within 2 midnights of admission. The following factors support the patient status of inpatient.   " The patient's presenting symptoms include tachycardia hypertension pericarditis. " The worrisome physical exam findings include tachycardia. " The initial radiographic and laboratory data are worrisome because of pericarditis status post pericardial window. " The chronic co-morbidities include atrial fibrillation with rapid ventricular rate.   * I certify that at the point of admission it is my clinical judgment that the patient will require inpatient hospital care spanning beyond 2 midnights from the point of admission due to high intensity of service, high risk for further deterioration and high frequency of surveillance required.*    Family Communication: Daughter Melissa at bedside.  Discussed with her and updated her on the findings and and plan and answered her questions satisfactorily   Disposition Plan: Home when stable   Consultants:  Vascular surgery  Procedures:  Subxiphoid pericardial window  Chest tube insertion on the left removed December 07, 2018  Antimicrobials:  Zosyn  DVT prophylaxis: Eliquis  GI prophylaxis with Protonix   Objective: Vitals:   03/10/19 2305 03/11/19 0318 03/11/19 0514 03/11/19 0828  BP: (!)  137/96 (!) 144/99  (!) 154/73  Pulse: (!) 108 (!) 116  75  Resp: 20 20  20   Temp: 98 F (36.7 C) 98.3 F (36.8 C)  97.8 F (36.6 C)  TempSrc: Oral Oral  Oral  SpO2: 95% 94%  100%  Weight:   79.6 kg   Height:        Intake/Output Summary (Last 24 hours) at 03/11/2019 1007 Last data filed at 03/11/2019 0324 Gross per 24 hour  Intake 1283.77 ml  Output 1527 ml  Net -243.23 ml   Filed Weights   03/09/19 0600 03/10/19 0500 03/11/19 0514  Weight: 81.5 kg 78.9 kg 79.6 kg   Body mass index is 29.66 kg/m.  Exam:  . General: 84 y.o. year-old female well developed well nourished in no acute distress.  Alert and oriented x3. . Cardiovascular: Regular rate and rhythm with no rubs or gallops.  No thyromegaly or JVD noted.   Marland Kitchen Respiratory:  auscultation with faint expiratory wheezes no rales. Good inspiratory effort. . Abdomen: Soft nontender nondistended with normal bowel sounds x4 quadrants. . Musculoskeletal: No lower extremity edema. 2/4 pulses in all 4 extremities. . Skin: No ulcerative lesions noted or rashes, . Psychiatry: Mood is appropriate for condition and setting    Data Reviewed:    CBC: Recent Labs  Lab 03/06/19 0625 03/07/19 0419 03/08/19 0410 03/08/19 1603 03/09/19 0339 03/09/19 0349 03/10/19 0452  WBC 18.4* 14.2* 12.8*  --  13.2*  --  8.8  NEUTROABS 13.1*  --   --   --   --   --   --   HGB 13.0 10.0* 8.7* 9.5* 10.1* 9.9* 9.7*  HCT 41.7 32.5* 26.3* 28.0* 31.3* 29.0* 30.4*  MCV 93.9 97.6 91.6  --  91.5  --  92.7  PLT 224 183 149*  --  169  --  [id number]   Basic Metabolic Panel: Recent Labs  Lab 03/06/19 2135 03/07/19 0419 03/08/19 0410 03/08/19 1603 03/09/19 0339 03/09/19 0349 03/10/19 0452 03/11/19 0257  NA 141 139 139 136 139 137 140 141  K 4.2 4.4 3.2* 3.9 3.7 3.6 3.4* 3.8  CL 106 107 104  --  105  --  104 107  CO2 22 20* 21*  --  21*  --  23 25  GLUCOSE 130* 241* 129*  --  124*  --  99 96  BUN 18 20 23   --  30*  --  29* 23  CREATININE 1.57*  1.49* 1.59*  --  1.89*  --  1.55* 1.13*  CALCIUM 8.2* 7.8* 8.2*  --  8.3*  --  8.1* 8.1*  MG 1.6* 2.2 2.0  --  2.3  --  2.0  --   PHOS  --  5.4*  --   --   --   --   --   --    GFR: Estimated Creatinine Clearance: 39.6 mL/min (A) (by C-G formula based on SCr of 1.13 mg/dL (H)). Liver Function Tests: Recent Labs  Lab 03/06/19 0625 03/10/19 0452  AST 44* 33  ALT 47* 100*  ALKPHOS 114 98  BILITOT 1.5* 0.8  PROT 6.5 5.1*  ALBUMIN 3.3* 2.2*   Recent Labs  Lab 03/06/19 0625  LIPASE 26   No results for input(s): AMMONIA in the last 168 hours. Coagulation Profile: Recent Labs  Lab 03/06/19 0626 03/07/19 1330  INR 1.3* 2.0*   Cardiac Enzymes: No results for input(s): CKTOTAL, CKMB, CKMBINDEX, TROPONINI in the last 168 hours. BNP (last 3 results) No results for input(s): PROBNP in the last 8760 hours. HbA1C: No results for input(s): HGBA1C in the last 72 hours. CBG: Recent Labs  Lab 03/10/19 0646 03/10/19 1129 03/10/19 1638 03/10/19 2156 03/11/19 0605  GLUCAP 86 79 131* 105* 89   Lipid Profile: No results for input(s): CHOL, HDL, LDLCALC, TRIG, CHOLHDL, LDLDIRECT in the last 72 hours. Thyroid Function Tests: No results for input(s): TSH, T4TOTAL, FREET4, T3FREE, THYROIDAB in the last 72 hours. Anemia Panel: No results for input(s): VITAMINB12, FOLATE, FERRITIN, TIBC, IRON, RETICCTPCT in the last 72 hours. Urine analysis:    Component Value Date/Time   COLORURINE AMBER (A) 03/06/2019 0626   APPEARANCEUR HAZY (A) 03/06/2019 0626   LABSPEC >1.046 (H) 03/06/2019 0626   PHURINE 5.0 03/06/2019 0626   GLUCOSEU NEGATIVE 03/06/2019 0626   GLUCOSEU NEGATIVE 07/31/2016 1018   HGBUR NEGATIVE 03/06/2019 0626   BILIRUBINUR NEGATIVE 03/06/2019 0626   BILIRUBINUR neg 07/31/2016 1012   KETONESUR 5 (A) 03/06/2019 0626   PROTEINUR 100 (A) 03/06/2019 0626   UROBILINOGEN 0.2 07/31/2016 1018   NITRITE NEGATIVE 03/06/2019 0626   LEUKOCYTESUR NEGATIVE 03/06/2019 0626   Sepsis  Labs: @LABRCNTIP (procalcitonin:4,lacticidven:4)  ) Recent Results (from the past 240 hour(s))  SARS CORONAVIRUS 2 (TAT 6-24 HRS) Nasopharyngeal Nasopharyngeal Swab     Status: None  Collection Time: 03/06/19  6:37 AM   Specimen: Nasopharyngeal Swab  Result Value Ref Range Status   SARS Coronavirus 2 NEGATIVE NEGATIVE Final    Comment: (NOTE) SARS-CoV-2 target nucleic acids are NOT DETECTED. The SARS-CoV-2 RNA is generally detectable in upper and lower respiratory specimens during the acute phase of infection. Negative results do not preclude SARS-CoV-2 infection, do not rule out co-infections with other pathogens, and should not be used as the sole basis for treatment or other patient management decisions. Negative results must be combined with clinical observations, patient history, and epidemiological information. The expected result is Negative. Fact Sheet for Patients: SugarRoll.be Fact Sheet for Healthcare Providers: https://www.woods-mathews.com/ This test is not yet approved or cleared by the [place] FDA and  has been authorized for detection and/or diagnosis of SARS-CoV-2 by FDA under an Emergency Use Authorization (EUA). This EUA will remain  in effect (meaning this test can be used) for the duration of the COVID-19 declaration under Section 56 4(b)(1) of the Act, 21 U.S.C. section 360bbb-3(b)(1), unless the authorization is terminated or revoked sooner. Performed at [facility], [place] [address], [place], [place] [address]   Blood Culture (routine x 2)     Status: None   Collection Time: 03/06/19  6:38 AM   Specimen: BLOOD RIGHT HAND  Result Value Ref Range Status   Specimen Description   Final    BLOOD RIGHT HAND Performed at [facility] [address] [place], [place] [address]    Special Requests   Final    BOTTLES DRAWN AEROBIC AND ANAEROBIC Blood Culture adequate volume Performed at  [facility] [address] [place], [place] [address]    Culture   Final    NO GROWTH 5 DAYS Performed at [facility], [place] [address] [place], [place] [address]    Report Status 03/11/2019 FINAL  Final  Blood Culture (routine x 2)     Status: None   Collection Time: 03/06/19  6:38 AM   Specimen: BLOOD LEFT HAND  Result Value Ref Range Status   Specimen Description   Final    BLOOD LEFT HAND Performed at [facility] [address] [place], [place] [address]    Special Requests   Final    BOTTLES DRAWN AEROBIC AND ANAEROBIC Blood Culture adequate volume Performed at [facility] [address], [place], [place] [address]    Culture   Final    NO GROWTH 5 DAYS Performed at [facility], [place] [address], [place], [place] [address]    Report Status 03/11/2019 FINAL  Final  Respiratory Panel by RT PCR (Flu A&B, Covid) - Nasopharyngeal Swab     Status: None   Collection Time: 03/06/19 12:31 PM   Specimen: Nasopharyngeal Swab  Result Value Ref Range Status   SARS Coronavirus 2 by RT PCR NEGATIVE NEGATIVE Final    Comment: (NOTE) SARS-CoV-2 target nucleic acids are NOT DETECTED. The SARS-CoV-2 RNA is generally detectable in upper respiratoy specimens during the acute phase of infection. The lowest concentration of SARS-CoV-2 viral copies this assay can detect is 131 copies/mL. A negative result does not preclude SARS-Cov-2 infection and should not be used as the sole basis for treatment or other patient management decisions. A negative result may occur with  improper specimen collection/handling, submission of specimen other than nasopharyngeal swab, presence of viral mutation(s) within the areas targeted by this assay, and inadequate number of  viral copies (<131 copies/mL). A negative result must be combined with clinical observations, patient history, and epidemiological information. The expected result is  Negative. Fact Sheet for Patients:  PinkCheek.be Fact Sheet for Healthcare Providers:  GravelBags.it This test is not yet ap proved or cleared by the [place] FDA and  has been authorized for detection and/or diagnosis of SARS-CoV-2 by FDA under an Emergency Use Authorization (EUA). This EUA will remain  in effect (meaning this test can be used) for the duration of the COVID-19 declaration under Section 564(b)(1) of the Act, 21 U.S.C. section 360bbb-3(b)(1), unless the authorization is terminated or revoked sooner.    Influenza A by PCR NEGATIVE NEGATIVE Final   Influenza B by PCR NEGATIVE NEGATIVE Final    Comment: (NOTE) The Xpert Xpress SARS-CoV-2/FLU/RSV assay is intended as an aid in  the diagnosis of influenza from Nasopharyngeal swab specimens and  should not be used as a sole basis for treatment. Nasal washings and  aspirates are unacceptable for Xpert Xpress SARS-CoV-2/FLU/RSV  testing. Fact Sheet for Patients: PinkCheek.be Fact Sheet for Healthcare Providers: GravelBags.it This test is not yet approved or cleared by the [place] FDA and  has been authorized for detection and/or diagnosis of SARS-CoV-2 by  FDA under an Emergency Use Authorization (EUA). This EUA will remain  in effect (meaning this test can be used) for the duration of the  Covid-19 declaration under Section 564(b)(1) of the Act, 21  U.S.C. section 360bbb-3(b)(1), unless the authorization is  terminated or revoked. Performed at [facility], [place] [address], [place], [place] [address]   Urine culture     Status: None   Collection Time: 03/06/19  1:11 PM   Specimen: Urine, Catheterized  Result Value Ref Range Status   Specimen Description   Final    URINE, CATHETERIZED Performed at [facility] [address] [place], [place] [address]     Special Requests   Final    Immunocompromised Performed at [facility], [place] [address], [place], [place] [address]    Culture   Final    NO GROWTH Performed at [facility], [place] [address] [place], [place] [address]    Report Status 03/07/2019 FINAL  Final  MRSA PCR Screening     Status: None   Collection Time: 03/06/19  5:27 PM   Specimen: Nasopharyngeal  Result Value Ref Range Status   MRSA by PCR NEGATIVE NEGATIVE Final    Comment:        The GeneXpert MRSA Assay (FDA approved for NASAL specimens only), is one component of a comprehensive MRSA colonization surveillance program. It is not intended to diagnose MRSA infection nor to guide or monitor treatment for MRSA infections. Performed at [facility], [place] [address], [place], [place] [address]   Anaerobic culture     Status: None (Preliminary result)   Collection Time: 03/08/19  9:26 AM   Specimen: Pericardial; Body Fluid  Result Value Ref Range Status   Specimen Description PERICARDIAL  Final   Special Requests   Final    PATIENT ON FOLLOWING ZOSYN VANCOMYCIN Performed at [facility], [place] [address] [place], [place] [address]    Culture   Final    NO ANAEROBES ISOLATED; CULTURE IN PROGRESS FOR 5 DAYS   Report Status PENDING  Incomplete  Body fluid culture     Status: None   Collection Time: 03/08/19  9:26 AM   Specimen: Pericardial;  Body Fluid  Result Value Ref Range Status   Specimen Description PERICARDIAL  Final   Special Requests PATIENT ON FOLLOWING ZOSYN VANCOMYCIN  Final   Gram Stain   Final    RARE WBC PRESENT,BOTH PMN AND MONONUCLEAR NO ORGANISMS SEEN    Culture   Final    NO GROWTH 3 DAYS Performed at [facility], [address], [place], [place] [address]    Report Status 03/11/2019 FINAL  Final  Acid Fast Smear (AFB)     Status: None   Collection Time: 03/08/19  9:26 AM   Specimen: Pericardial; Body Fluid  Result Value Ref Range Status   AFB  Specimen Processing Concentration  Final   Acid Fast Smear Negative  Final    Comment: (NOTE) Performed At: [facility] [address] [place], [place] [id number] Rush Farmer MD [id number]    Source (AFB) PERICARDIAL  Final    Comment: Performed at [facility], [place] [address] [place], [place] [address]  Aerobic/Anaerobic Culture (surgical/deep wound)     Status: None (Preliminary result)   Collection Time: 03/08/19  9:32 AM   Specimen: Pericardial; Tissue  Result Value Ref Range Status   Specimen Description TISSUE PERICARDIAL  Final   Special Requests PATIENT ON FOLLOWING ZOSYN VANCOMYCIN  Final   Gram Stain NO WBC SEEN NO ORGANISMS SEEN   Final   Culture   Final    NO GROWTH 2 DAYS NO ANAEROBES ISOLATED; CULTURE IN PROGRESS FOR 5 DAYS Performed at [facility], [place] [address], [place], [place] [address]    Report Status PENDING  Incomplete  Acid Fast Smear (AFB)     Status: None   Collection Time: 03/08/19  9:32 AM   Specimen: Pericardial; Tissue  Result Value Ref Range Status   AFB Specimen Processing Comment  Final    Comment: Tissue Grinding and Digestion/Decontamination   Acid Fast Smear Negative  Final    Comment: (NOTE) Performed At: [facility] [place], [place] [id number] Rush Farmer MD [id number]    Source (AFB) TISSUE  Final    Comment: PERICARDIAL Performed at [facility], [place] [address], [place],  [address]       Studies: DG Chest Port 1 View  Result Date: 03/11/2019 CLINICAL DATA:  84 year old female with weakness EXAM: PORTABLE CHEST 1 VIEW COMPARISON:  03/10/2019 FINDINGS: Cardiomediastinal silhouette unchanged in size and contour. Right IJ central venous catheter terminates at the superior cavoatrial junction, unchanged. No pneumothorax. Reticulonodular opacities of the bilateral lungs with retrocardiac opacity and blunting of the bilateral costophrenic angles. Peribronchial  thickening. IMPRESSION: Unchanged appearance of the chest x-ray, with mixed interstitial and airspace disease at the lung bases, potentially combination of atelectasis/consolidation and edema. Left pleural effusion persist. Right IJ central venous catheter. Electronically Signed   By: Corrie Mckusick D.O.   On: 03/11/2019 09:24    Scheduled Meds: . sodium chloride   Intravenous Once  . acetaminophen  1,000 mg Oral Q6H   Or  . acetaminophen (TYLENOL) oral liquid 160 mg/5 mL  1,000 mg Oral Q6H  . acetaminophen  1,000 mg Oral Once  . alum & mag hydroxide-simeth  30 mL Oral Once   And  . lidocaine  15 mL Oral Once  . amiodarone  200 mg Oral Daily  . apixaban  2.5 mg Oral BID  . bisacodyl  10 mg Oral Daily  . Chlorhexidine Gluconate Cloth  6 each Topical Daily  .  diltiazem  60 mg Oral TID  . escitalopram  10 mg Oral Daily  . insulin aspart  0-9 Units Subcutaneous TID WC  . metoprolol tartrate  100 mg Oral BID  . pantoprazole  40 mg Oral Daily  . potassium chloride SA  20 mEq Oral Daily  . predniSONE  10 mg Oral Q breakfast  . rosuvastatin  10 mg Oral QHS  . senna-docusate  1 tablet Oral QHS    Continuous Infusions: . sodium chloride 50 mL/hr at 03/11/19 0512  . sodium chloride       LOS: 5 days     Cristal Deer, MD [facility]  To reach me or the doctor on call, go to: [email or web address] Password [email or web address]  03/11/2019, 10:07 AM

## 2019-03-11 NOTE — Progress Notes (Signed)
[place] for apixaban Indication: atrial fibrillation  Allergies  Allergen Reactions  . Dextromethorphan Rash    Patient Measurements: Height: 5' 4.5" (163.8 cm) Weight: 175 lb 7.8 oz (79.6 kg)(bed weight) IBW/kg (Calculated) : 55.85   Vital Signs: Temp: 97.8 F (36.6 C) (12/12 0828) Temp Source: Oral (12/12 0828) BP: 154/73 (12/12 0828) Pulse Rate: 75 (12/12 0828)  Labs: Recent Labs    03/09/19 0339 03/09/19 0349 03/10/19 0452 03/11/19 0257  HGB 10.1* 9.9* 9.7*  --   HCT 31.3* 29.0* 30.4*  --   PLT 169  --  182  --   CREATININE 1.89*  --  1.55* 1.13*    Estimated Creatinine Clearance: 39.6 mL/min (A) (by C-G formula based on SCr of 1.13 mg/dL (H)).   Medical History: Past Medical History:  Diagnosis Date  . Abdominal pain, epigastric 06/22/2013  . Abdominal pain, other specified site 12/24/2011  . Abnormal urinalysis 05/20/2017  . Acute respiratory failure with hypoxia ([place]) 05/20/2017  . Anemia, unspecified 10/28/2012  . Anxiety state, unspecified 10/28/2012  . Bruises easily    d/t being on prednisone and plavix  . CAD (coronary artery disease)    a. Stent RCA in [facility];  b. Cath approx 2009 - nonobs per pt report.  . Cataract    immature on the left eye  . Chronic insomnia 02/07/2013  . Chronic lower back pain    scoliosis  . CKD (chronic kidney disease) stage 3, GFR 30-59 ml/min   . Complication of anesthesia    pt has a very high tolerance to meds  . Coronary disease 05/20/2017  . CVA (cerebral infarction) 10/29/2012  . DDD (degenerative disc disease)   . Depression   . Diverticulitis    hx of  . Diverticulosis   . Elevated transaminase level 02/07/2013  . Enteritis   . GERD (gastroesophageal reflux disease) 09/01/2010  . Giant cell arteritis ([place])   . Hemorrhoids   . Herniated nucleus pulposus, L5-S1, right 11/04/2015  . History of scabies   . HTN (hypertension) 05/20/2017  . Hyperlipidemia    takes  Lipitor daily  . Hypertension    takes Amlodipine,Losartan,Metoprolol,and HCTZ daily  . Incontinence of urine   . Influenza A 05/20/2017  . Insomnia    takes Restoril nightly  . Lumbar stenosis 04/25/2013  . Major depressive disorder, recurrent episode, moderate ([place]) 07/16/2013  . Migraines    "back in my 20's; none since" (10/28/2012)  . Osteoporosis   . PAF (paroxysmal atrial fibrillation) ([place]) 2011   a. lone epidode in 2011 according to notes.  . Rheumatoid arthritis ([place])   . Scoliosis   . Sepsis ([place]) 05/20/2017  . Sinus bradycardia    a. on chronic bb  . Temporal arteritis ([place]) 2011   a. followed @ [facility]; potential flareup 10/28/2012/notes 10/28/2012  . Vocal cord dysfunction    "they don't operate properly" (10/28/2012)      Assessment: 84 yo female with pericardial effusion s/p pericardial window on 12/9 and drain placement (for removal today). She was on apixaban PTA for afib. Pharmacy consulted to restart anticoagulation beginning tonight. At home she was taking apixaban 5mg  po bid  SCr elevated 12/11 upon restart so lower-dose appropriately utilized. SCr this morning improved to 1.1 (close to baseline) so will adjust dose.   Goal of Therapy:  Monitor platelets by anticoagulation protocol: Yes   Plan:  -Increase apixaban to 5mg  BID -Follow Cr and CBC  Arrie Senate, PharmD, BCPS Clinical Pharmacist Please check AMION for all [place] numbers 03/11/2019

## 2019-03-12 DIAGNOSIS — I318 Other specified diseases of pericardium: Secondary | ICD-10-CM

## 2019-03-12 LAB — GLUCOSE, CAPILLARY
Glucose-Capillary: 84 mg/dL (ref 70–99)
Glucose-Capillary: 95 mg/dL (ref 70–99)
Glucose-Capillary: 131 mg/dL — ABNORMAL HIGH (ref 70–99)
Glucose-Capillary: 125 mg/dL — ABNORMAL HIGH (ref 70–99)

## 2019-03-12 MED ORDER — HYDROCODONE-ACETAMINOPHEN 5-325 MG PO TABS
1.0000 | ORAL_TABLET | Freq: Four times a day (QID) | ORAL | Status: DC | PRN
  Administered 2019-03-12 – 2019-03-13 (×3): 1 via ORAL
  Filled 2019-03-12 (×3): qty 1

## 2019-03-12 MED ORDER — FUROSEMIDE 20 MG PO TABS
20.0000 mg | ORAL_TABLET | Freq: Two times a day (BID) | ORAL | Status: DC
  Administered 2019-03-12 – 2019-03-13 (×3): 20 mg via ORAL
  Filled 2019-03-12 (×3): qty 1

## 2019-03-12 NOTE — Progress Notes (Signed)
PROGRESS NOTE  Betty Jackson [id number] DOB: [birth year] DOA: 03/06/2019 PCP: Eulas Post, MD  HPI/Recap of past 68 hours: 84 year old female with long-term glucocorticoid use for temporal arteritis and PMR, atrial fibrillation Eliquis, chronic back pain and recent admission for diverticulitis.  She presented to the emergency room March 06, 2019 with dysphagia for several days with nausea vomiting and diarrhea she was found to be febrile with tachycardia and hypotension in the ED she was diagnosed with pericardial effusion she was then evaluated by CT surgery and they felt that there was no tamponade.  Patient underwent pericardial window on March 08, 2019.  She has atrial fibrillation with rapid ventricular rate that is stable on metoprolol . Viral pericarditis, vs underline Rheumatologic disorder.  Hospitalist is assuming care from cardiothoracic surgery.  subjective: Patient seen and examined at bedside nurse reported she was anxious this morning and her heart rate went into the 130s 140s she had to give her metoprolol.  Patient reports not feeling good.  Call back to see patient by nurse because patient started with bedside concerned about shortness of breath.  Getting to the bedside patient is calm heart rate is not she is saturating 98%.  She has been given Vistaril he also has been given citalopram for anxiety depression.  Patient's respiration is unlabored. .  Daughter stated that she had a fracture or coccygeal fracture from a fall where she sat down at home that she might be in pain as well because she usually takes some narcotic pain medicine at home..  She is also concerned because patient participated in PT yesterday but today she looks worse and she is complaining of pain in the ribs bilaterally or upper abdomen bilaterally short of breath which patient has never had any lung issues per her daughter and that she is been experiencing anxiety.  Also she is concerned about  patient possibly having fever.  She stated that when she checked her temperature at home prior to bring her to the hospital her temperature was normal axillary but when they got to the emergency room it was checked rectally and she had a fever.  Patient has not had any fever lately.  But I have asked the nurse to do a rectal temperature.  Discussed with her and updated her on the findings and and plan and answered her questions satisfactorily.  March 12, 2019. Subjective: Patient seen and examined at bedside she stated she feels much better today and that the Lasix is working well, denies any shortness of breath she asking for nurse to give her her Lasix.   Assessment/Plan: Active Problems:   Sepsis ([place])   Pericarditis   Fever   Septic shock ([facility])   Atypical chest pain   Cardiac tamponade  #1 pericardial effusion.  Echocardiogram done yesterday showed resolution of the pericardial effusion patient is status post pericardial window.  Her drain was removed  March 10, 2019.  The pericardial fluid cultures and tissue are negative so this is probably viral pericarditis, non- Covid.  2.  Chronic atrial fibrillation with rapid ventricular rate patient is on Eliquis for this also Cardizem and metoprolol.  She had to get a dose of metoprolol this morning because her heart rate went into the 130s.  She was also stated to be anxious..  Continue amiodarone 200 mg daily if needed at discharge  3.  Depression/anxiety patient is on citalopram  4.  Anxiety probably with panic attack.  I will add hydroxyzine as needed  for panic she will continue her citalopram 10 mg daily  5.  History of temporal arteritis on chronic prednisone.  Patient was started on stress steroid on admission due to shock but she is back on regular p.o. prednisone per her home dose  6.  History of diverticulitis stable  7.  Septic shock unknown etiology resolved, patient is currently on empiric Zosyn we will continue for follow  cultures   8.  Sepsis present on admission resolved  9.  Possible pneumonia continue Zosyn and nebulizer treatment encourage incentive spirometry use  10.  Left pleural effusion persisting.  Continue Lasix 20 mg twice a day.  I gave him extra 40 mg today.  We will discontinue her IV fluid, Lasix him to have worked well  Code Status: DNR  Severity of Illness: The appropriate patient status for this patient is INPATIENT. Inpatient status is judged to be reasonable and necessary in order to provide the required intensity of service to ensure the patient's safety. The patient's presenting symptoms, physical exam findings, and initial radiographic and laboratory data in the context of their chronic comorbidities is felt to place them at high risk for further clinical deterioration. Furthermore, it is not anticipated that the patient will be medically stable for discharge from the hospital within 2 midnights of admission. The following factors support the patient status of inpatient.   " The patient's presenting symptoms include tachycardia hypertension pericarditis. " The worrisome physical exam findings include tachycardia. " The initial radiographic and laboratory data are worrisome because of pericarditis status post pericardial window. " The chronic co-morbidities include atrial fibrillation with rapid ventricular rate.   * I certify that at the point of admission it is my clinical judgment that the patient will require inpatient hospital care spanning beyond 2 midnights from the point of admission due to high intensity of service, high risk for further deterioration and high frequency of surveillance required.*    Family Communication: Daughter Melissa at bedside.  Discussed with her and updated her on the findings and and plan and answered her questions satisfactorily   Disposition Plan: Home when stable   Consultants:  Vascular surgery  Procedures:  Subxiphoid pericardial  window  Chest tube insertion on the left removed December 07, 2018  Antimicrobials:  Zosyn  DVT prophylaxis: Eliquis  GI prophylaxis with Protonix   Objective: Vitals:   03/11/19 2008 03/12/19 0004 03/12/19 0513 03/12/19 0755  BP: (!) 162/73 122/65 (!) 149/71 (!) 156/71  Pulse: 82 (!) 54  64  Resp: 20 14 18 18   Temp: 98 F (36.7 C) 98.1 F (36.7 C) 98.2 F (36.8 C) 97.8 F (36.6 C)  TempSrc: Oral Oral Oral Oral  SpO2: 99% 99% 98% 100%  Weight:   79.5 kg   Height:        Intake/Output Summary (Last 24 hours) at 03/12/2019 0903 Last data filed at 03/12/2019 0803 Gross per 24 hour  Intake 240 ml  Output 2400 ml  Net -2160 ml   Filed Weights   03/10/19 0500 03/11/19 0514 03/12/19 0513  Weight: 78.9 kg 79.6 kg 79.5 kg   Body mass index is 29.62 kg/m.  Exam:  . General: 84 y.o. year-old female well developed well nourished in no acute distress.  Alert and oriented x3. . Cardiovascular: Regular rate and rhythm with no rubs or gallops.  No thyromegaly or JVD noted.   Marland Kitchen Respiratory: Clear auscultation with very faint expiratory wheezes no rales. Good inspiratory effort. . Abdomen: Soft nontender  nondistended with normal bowel sounds x4 quadrants. . Musculoskeletal: No lower extremity edema. 2/4 pulses in all 4 extremities. . Skin: No ulcerative lesions noted or rashes, . Psychiatry: Mood is appropriate for condition and setting    Data Reviewed:    CBC: Recent Labs  Lab 03/06/19 0625 03/07/19 0419 03/08/19 0410 03/08/19 1603 03/09/19 0339 03/09/19 0349 03/10/19 0452  WBC 18.4* 14.2* 12.8*  --  13.2*  --  8.8  NEUTROABS 13.1*  --   --   --   --   --   --   HGB 13.0 10.0* 8.7* 9.5* 10.1* 9.9* 9.7*  HCT 41.7 32.5* 26.3* 28.0* 31.3* 29.0* 30.4*  MCV 93.9 97.6 91.6  --  91.5  --  92.7  PLT 224 183 149*  --  169  --  [id number]   Basic Metabolic Panel: Recent Labs  Lab 03/06/19 2135 03/07/19 0419 03/08/19 0410 03/08/19 1603 03/09/19 0339 03/09/19 0349  03/10/19 0452 03/11/19 0257  NA 141 139 139 136 139 137 140 141  K 4.2 4.4 3.2* 3.9 3.7 3.6 3.4* 3.8  CL 106 107 104  --  105  --  104 107  CO2 22 20* 21*  --  21*  --  23 25  GLUCOSE 130* 241* 129*  --  124*  --  99 96  BUN 18 20 23   --  30*  --  29* 23  CREATININE 1.57* 1.49* 1.59*  --  1.89*  --  1.55* 1.13*  CALCIUM 8.2* 7.8* 8.2*  --  8.3*  --  8.1* 8.1*  MG 1.6* 2.2 2.0  --  2.3  --  2.0  --   PHOS  --  5.4*  --   --   --   --   --   --    GFR: Estimated Creatinine Clearance: 39.6 mL/min (A) (by C-G formula based on SCr of 1.13 mg/dL (H)). Liver Function Tests: Recent Labs  Lab 03/06/19 0625 03/10/19 0452  AST 44* 33  ALT 47* 100*  ALKPHOS 114 98  BILITOT 1.5* 0.8  PROT 6.5 5.1*  ALBUMIN 3.3* 2.2*   Recent Labs  Lab 03/06/19 0625  LIPASE 26   No results for input(s): AMMONIA in the last 168 hours. Coagulation Profile: Recent Labs  Lab 03/06/19 0626 03/07/19 1330  INR 1.3* 2.0*   Cardiac Enzymes: No results for input(s): CKTOTAL, CKMB, CKMBINDEX, TROPONINI in the last 168 hours. BNP (last 3 results) No results for input(s): PROBNP in the last 8760 hours. HbA1C: No results for input(s): HGBA1C in the last 72 hours. CBG: Recent Labs  Lab 03/11/19 0605 03/11/19 1128 03/11/19 1710 03/11/19 2103 03/12/19 0630  GLUCAP 89 65* 107* 189* 84   Lipid Profile: No results for input(s): CHOL, HDL, LDLCALC, TRIG, CHOLHDL, LDLDIRECT in the last 72 hours. Thyroid Function Tests: No results for input(s): TSH, T4TOTAL, FREET4, T3FREE, THYROIDAB in the last 72 hours. Anemia Panel: No results for input(s): VITAMINB12, FOLATE, FERRITIN, TIBC, IRON, RETICCTPCT in the last 72 hours. Urine analysis:    Component Value Date/Time   COLORURINE AMBER (A) 03/06/2019 0626   APPEARANCEUR HAZY (A) 03/06/2019 0626   LABSPEC >1.046 (H) 03/06/2019 0626   PHURINE 5.0 03/06/2019 0626   GLUCOSEU NEGATIVE 03/06/2019 0626   GLUCOSEU NEGATIVE 07/31/2016 1018   HGBUR NEGATIVE  03/06/2019 0626   BILIRUBINUR NEGATIVE 03/06/2019 0626   BILIRUBINUR neg 07/31/2016 1012   KETONESUR 5 (A) 03/06/2019 0626   PROTEINUR 100 (A) 03/06/2019 [id number]  UROBILINOGEN 0.2 07/31/2016 1018   NITRITE NEGATIVE 03/06/2019 0626   LEUKOCYTESUR NEGATIVE 03/06/2019 0626   Sepsis Labs: @LABRCNTIP (procalcitonin:4,lacticidven:4)  ) Recent Results (from the past 240 hour(s))  SARS CORONAVIRUS 2 (TAT 6-24 HRS) Nasopharyngeal Nasopharyngeal Swab     Status: None   Collection Time: 03/06/19  6:37 AM   Specimen: Nasopharyngeal Swab  Result Value Ref Range Status   SARS Coronavirus 2 NEGATIVE NEGATIVE Final    Comment: (NOTE) SARS-CoV-2 target nucleic acids are NOT DETECTED. The SARS-CoV-2 RNA is generally detectable in upper and lower respiratory specimens during the acute phase of infection. Negative results do not preclude SARS-CoV-2 infection, do not rule out co-infections with other pathogens, and should not be used as the sole basis for treatment or other patient management decisions. Negative results must be combined with clinical observations, patient history, and epidemiological information. The expected result is Negative. Fact Sheet for Patients: SugarRoll.be Fact Sheet for Healthcare Providers: https://www.woods-mathews.com/ This test is not yet approved or cleared by the [place] FDA and  has been authorized for detection and/or diagnosis of SARS-CoV-2 by FDA under an Emergency Use Authorization (EUA). This EUA will remain  in effect (meaning this test can be used) for the duration of the COVID-19 declaration under Section 56 4(b)(1) of the Act, 21 U.S.C. section 360bbb-3(b)(1), unless the authorization is terminated or revoked sooner. Performed at [facility], [place] [address] [place], [place] [address]   Blood Culture (routine x 2)     Status: None   Collection Time: 03/06/19  6:38 AM   Specimen: BLOOD RIGHT HAND    Result Value Ref Range Status   Specimen Description   Final    BLOOD RIGHT HAND Performed at [facility] [address] [place], [place] [address]    Special Requests   Final    BOTTLES DRAWN AEROBIC AND ANAEROBIC Blood Culture adequate volume Performed at [facility] [address], [place], [place] [address]    Culture   Final    NO GROWTH 5 DAYS Performed at [facility], [place] [address] [place], [place] [address]    Report Status 03/11/2019 FINAL  Final  Blood Culture (routine x 2)     Status: None   Collection Time: 03/06/19  6:38 AM   Specimen: BLOOD LEFT HAND  Result Value Ref Range Status   Specimen Description   Final    BLOOD LEFT HAND Performed at [facility] [address], [place], [place] [address]    Special Requests   Final    BOTTLES DRAWN AEROBIC AND ANAEROBIC Blood Culture adequate volume Performed at [facility] [address], [place], [place] [address]    Culture   Final    NO GROWTH 5 DAYS Performed at [facility], [place] [address], [place],  [address]    Report Status 03/11/2019 FINAL  Final  Respiratory Panel by RT PCR (Flu A&B, Covid) - Nasopharyngeal Swab     Status: None   Collection Time: 03/06/19 12:31 PM   Specimen: Nasopharyngeal Swab  Result Value Ref Range Status   SARS Coronavirus 2 by RT PCR NEGATIVE NEGATIVE Final    Comment: (NOTE) SARS-CoV-2 target nucleic acids are NOT DETECTED. The SARS-CoV-2 RNA is generally detectable in upper respiratoy specimens during the acute phase of infection. The lowest concentration of SARS-CoV-2 viral copies this assay can detect is 131 copies/mL. A negative result does not preclude SARS-Cov-2  infection and should not be used as the sole basis for treatment or other patient management decisions. A negative result may occur with  improper specimen collection/handling, submission of specimen  other than nasopharyngeal swab, presence of viral mutation(s) within the areas targeted by this assay, and inadequate number of viral copies (<131 copies/mL). A negative result must be combined with clinical observations, patient history, and epidemiological information. The expected result is Negative. Fact Sheet for Patients:  PinkCheek.be Fact Sheet for Healthcare Providers:  GravelBags.it This test is not yet ap proved or cleared by the [place] FDA and  has been authorized for detection and/or diagnosis of SARS-CoV-2 by FDA under an Emergency Use Authorization (EUA). This EUA will remain  in effect (meaning this test can be used) for the duration of the COVID-19 declaration under Section 564(b)(1) of the Act, 21 U.S.C. section 360bbb-3(b)(1), unless the authorization is terminated or revoked sooner.    Influenza A by PCR NEGATIVE NEGATIVE Final   Influenza B by PCR NEGATIVE NEGATIVE Final    Comment: (NOTE) The Xpert Xpress SARS-CoV-2/FLU/RSV assay is intended as an aid in  the diagnosis of influenza from Nasopharyngeal swab specimens and  should not be used as a sole basis for treatment. Nasal washings and  aspirates are unacceptable for Xpert Xpress SARS-CoV-2/FLU/RSV  testing. Fact Sheet for Patients: PinkCheek.be Fact Sheet for Healthcare Providers: GravelBags.it This test is not yet approved or cleared by the [place] FDA and  has been authorized for detection and/or diagnosis of SARS-CoV-2 by  FDA under an Emergency Use Authorization (EUA). This EUA will remain  in effect (meaning this test can be used) for the duration of the  Covid-19 declaration under Section 564(b)(1) of the Act, 21  U.S.C. section 360bbb-3(b)(1), unless the authorization is  terminated or revoked. Performed at [facility], [place] [address], [place], [place] [address]   Urine culture     Status: None   Collection Time: 03/06/19  1:11 PM   Specimen: Urine, Catheterized  Result Value Ref Range Status   Specimen Description   Final    URINE, CATHETERIZED Performed at [facility] [address], [place], [place] [address]    Special Requests   Final    Immunocompromised Performed at [facility], [place] [address], [place], [place] [address]    Culture   Final    NO GROWTH Performed at [facility], [place] [address] [place], [place] [address]    Report Status 03/07/2019 FINAL  Final  MRSA PCR Screening     Status: None   Collection Time: 03/06/19  5:27 PM   Specimen: Nasopharyngeal  Result Value Ref Range Status   MRSA by PCR NEGATIVE NEGATIVE Final    Comment:        The GeneXpert MRSA Assay (FDA approved for NASAL specimens only), is one component of a comprehensive MRSA colonization surveillance program. It is not intended to diagnose MRSA infection nor to guide or monitor treatment for MRSA infections. Performed at [facility], [place] [address] [place], [place] [address]   Anaerobic culture     Status: None (Preliminary result)   Collection Time: 03/08/19  9:26 AM   Specimen: Pericardial; Body Fluid  Result Value Ref Range Status   Specimen Description PERICARDIAL  Final   Special Requests   Final    PATIENT ON FOLLOWING ZOSYN VANCOMYCIN Performed at [facility], [place] [address], [place], [place]  [address]    Culture   Final    NO ANAEROBES ISOLATED; CULTURE IN PROGRESS FOR 5 DAYS   Report Status PENDING  Incomplete  Body fluid culture     Status: None   Collection Time: 03/08/19  9:26 AM   Specimen: Pericardial; Body Fluid  Result Value Ref Range Status   Specimen Description PERICARDIAL  Final   Special Requests PATIENT ON FOLLOWING ZOSYN VANCOMYCIN  Final   Gram Stain   Final    RARE WBC PRESENT,BOTH PMN AND MONONUCLEAR NO ORGANISMS SEEN     Culture   Final    NO GROWTH 3 DAYS Performed at [facility], [place] [address] [place], [place] [address]    Report Status 03/11/2019 FINAL  Final  Fungus Culture With Stain     Status: None (Preliminary result)   Collection Time: 03/08/19  9:26 AM   Specimen: Pericardial; Body Fluid  Result Value Ref Range Status   Fungus Stain Final report  Final    Comment: (NOTE) Performed At: [facility] [place], [place] [id number] Rush Farmer MD [id number]    Fungus (Mycology) Culture PENDING  Incomplete   Fungal Source PERICARDIAL  Final    Comment: Performed at [facility], [place] [address], [place], [place] [address]  Acid Fast Smear (AFB)     Status: None   Collection Time: 03/08/19  9:26 AM   Specimen: Pericardial; Body Fluid  Result Value Ref Range Status   AFB Specimen Processing Concentration  Final   Acid Fast Smear Negative  Final    Comment: (NOTE) Performed At: [facility] [place], [place] [id number] Rush Farmer MD [id number]    Source (AFB) PERICARDIAL  Final    Comment: Performed at [facility], [place] [address] [place], [place] [address]  Fungus Culture Result     Status: None   Collection Time: 03/08/19  9:26 AM  Result Value Ref Range Status   Result 1 Comment  Final    Comment: (NOTE) KOH/Calcofluor preparation:  no fungus observed. Performed At: [facility] [place], [place] [id number] Rush Farmer MD [id number]   Fungus Culture With Stain     Status: None (Preliminary result)   Collection Time: 03/08/19  9:32 AM   Specimen: Pericardial; Tissue  Result Value Ref Range Status   Fungus Stain Final report  Final    Comment: (NOTE) Performed At: [facility] [place], [place] [id number] Rush Farmer MD [id number]    Fungus (Mycology) Culture PENDING  Incomplete   Fungal Source TISSUE  Final    Comment: PERICARDIAL Performed at  [facility], [place] [address] [place], [place] [address]   Aerobic/Anaerobic Culture (surgical/deep wound)     Status: None (Preliminary result)   Collection Time: 03/08/19  9:32 AM   Specimen: Pericardial; Tissue  Result Value Ref Range Status   Specimen Description TISSUE PERICARDIAL  Final   Special Requests PATIENT ON FOLLOWING ZOSYN VANCOMYCIN  Final   Gram Stain NO WBC SEEN NO ORGANISMS SEEN   Final   Culture   Final    NO GROWTH 3 DAYS NO ANAEROBES ISOLATED; CULTURE IN PROGRESS FOR 5 DAYS Performed at [facility], [place] [address] [place], [place] [address]    Report Status PENDING  Incomplete  Acid Fast Smear (AFB)     Status: None   Collection Time: 03/08/19  9:32 AM   Specimen:  Pericardial; Tissue  Result Value Ref Range Status   AFB Specimen Processing Comment  Final    Comment: Tissue Grinding and Digestion/Decontamination   Acid Fast Smear Negative  Final    Comment: (NOTE) Performed At: [facility] [place], [place] [id number] Rush Farmer MD [id number]    Source (AFB) TISSUE  Final    Comment: PERICARDIAL Performed at [facility], [place] [address] [place], [place] [address]   Fungus Culture Result     Status: None   Collection Time: 03/08/19  9:32 AM  Result Value Ref Range Status   Result 1 Comment  Final    Comment: (NOTE) KOH/Calcofluor preparation:  no fungus observed. Performed At: [facility] [place], [place] [id number] Rush Farmer MD [id number]       Studies: No results found.  Scheduled Meds: . sodium chloride   Intravenous Once  . acetaminophen  1,000 mg Oral Q6H   Or  . acetaminophen (TYLENOL) oral liquid 160 mg/5 mL  1,000 mg Oral Q6H  . acetaminophen  1,000 mg Oral Once  . alum & mag hydroxide-simeth  30 mL Oral Once   And  . lidocaine  15 mL Oral Once  . amiodarone  200 mg Oral Daily  . apixaban  5 mg Oral BID  . bisacodyl  10 mg Oral Daily  .  Chlorhexidine Gluconate Cloth  6 each Topical Daily  . diltiazem  60 mg Oral TID  . escitalopram  10 mg Oral Daily  . insulin aspart  0-9 Units Subcutaneous TID WC  . metoprolol tartrate  100 mg Oral BID  . pantoprazole  40 mg Oral Daily  . potassium chloride SA  20 mEq Oral Daily  . predniSONE  10 mg Oral Q breakfast  . rosuvastatin  10 mg Oral QHS  . senna-docusate  1 tablet Oral QHS    Continuous Infusions: . sodium chloride 50 mL/hr at 03/11/19 0512  . sodium chloride       LOS: 6 days     Cristal Deer, MD [facility]  To reach me or the doctor on call, go to: [email or web address] Password [email or web address]  03/12/2019, 9:03 AM

## 2019-03-12 NOTE — Progress Notes (Addendum)
Verbal order received from Catron MD for furosemide 20mg  by mouth BID.

## 2019-03-12 NOTE — Evaluation (Signed)
Clinical/Bedside Swallow Evaluation Patient Details  Name: Betty Jackson MRN: [id number] Date of Birth: 1934/12/10  Today's Date: 03/12/2019 Time: SLP Start Time (ACUTE ONLY): 1110 SLP Stop Time (ACUTE ONLY): 1132 SLP Time Calculation (min) (ACUTE ONLY): 22 min  Past Medical History:  Past Medical History:  Diagnosis Date  . Abdominal pain, epigastric 06/22/2013  . Abdominal pain, other specified site 12/24/2011  . Abnormal urinalysis 05/20/2017  . Acute respiratory failure with hypoxia ([place]) 05/20/2017  . Anemia, unspecified 10/28/2012  . Anxiety state, unspecified 10/28/2012  . Bruises easily    d/t being on prednisone and plavix  . CAD (coronary artery disease)    a. Stent RCA in [facility];  b. Cath approx 2009 - nonobs per pt report.  . Cataract    immature on the left eye  . Chronic insomnia 02/07/2013  . Chronic lower back pain    scoliosis  . CKD (chronic kidney disease) stage 3, GFR 30-59 ml/min   . Complication of anesthesia    pt has a very high tolerance to meds  . Coronary disease 05/20/2017  . CVA (cerebral infarction) 10/29/2012  . DDD (degenerative disc disease)   . Depression   . Diverticulitis    hx of  . Diverticulosis   . Elevated transaminase level 02/07/2013  . Enteritis   . GERD (gastroesophageal reflux disease) 09/01/2010  . Giant cell arteritis ([place])   . Hemorrhoids   . Herniated nucleus pulposus, L5-S1, right 11/04/2015  . History of scabies   . HTN (hypertension) 05/20/2017  . Hyperlipidemia    takes Lipitor daily  . Hypertension    takes Amlodipine,Losartan,Metoprolol,and HCTZ daily  . Incontinence of urine   . Influenza A 05/20/2017  . Insomnia    takes Restoril nightly  . Lumbar stenosis 04/25/2013  . Major depressive disorder, recurrent episode, moderate ([place]) 07/16/2013  . Migraines    "back in my 20's; none since" (10/28/2012)  . Osteoporosis   . PAF (paroxysmal atrial fibrillation) ([employer]) 2011   a. lone epidode in 2011 according to notes.   . Rheumatoid arthritis ([place])   . Scoliosis   . Sepsis ([place]) 05/20/2017  . Sinus bradycardia    a. on chronic bb  . Temporal arteritis ([place]) 2011   a. followed @ [facility]; potential flareup 10/28/2012/notes 10/28/2012  . Vocal cord dysfunction    "they don't operate properly" (10/28/2012)   Past Surgical History:  Past Surgical History:  Procedure Laterality Date  . ABDOMINAL HYSTERECTOMY  ~ 1984   vaginally  . BACK SURGERY  7-33yrs ago   X Stop  . BLADDER SUSPENSION  2001  . BREAST BIOPSY Right   . CATARACT EXTRACTION W/ INTRAOCULAR LENS IMPLANT Right ~ 08/2012  . CHEST TUBE INSERTION Left 03/08/2019   Procedure: Chest Tube Insertion;  Surgeon: Ivin Poot, MD;  Location: [place];  Service: Thoracic;  Laterality: Left;  . COLONOSCOPY  01/26/2012   Procedure: COLONOSCOPY;  Surgeon: Ladene Artist, MD,FACG;  Location[facility] ENDOSCOPY;  Service: Endoscopy;  Laterality: N/A;  note the EGD is possible  . CORONARY ANGIOPLASTY WITH STENT PLACEMENT  2006   X 1 stent  . EPIDURAL BLOCK INJECTION    . ESOPHAGOGASTRODUODENOSCOPY  01/26/2012   Procedure: ESOPHAGOGASTRODUODENOSCOPY (EGD);  Surgeon: Ladene Artist, MD,FACG;  Location: [facility] ENDOSCOPY;  Service: Endoscopy;  Laterality: N/A;  . HEMIARTHROPLASTY HIP Right 2012  . LAPAROSCOPIC CHOLECYSTECTOMY  2001  . LUMBAR FUSION  03/2013  . LUMBAR LAMINECTOMY/DECOMPRESSION MICRODISCECTOMY Right 11/04/2015  Procedure: Right Lumbar Five-Sacral One Microdiskectomy;  Surgeon: Kristeen Miss, MD;  Location: [place] NEURO ORS;  Service: Neurosurgery;  Laterality: Right;  Right L5-S1 Microdiskectomy  . moles removed that required stiches     one on leg and one on face  . SUBXYPHOID PERICARDIAL WINDOW N/A 03/08/2019   Procedure: SUBXYPHOID PERICARDIAL WINDOW;  Surgeon: Ivin Poot, MD;  Location: [place];  Service: Thoracic;  Laterality: N/A;  . TEE WITHOUT CARDIOVERSION N/A 03/08/2019   Procedure: TRANSESOPHAGEAL ECHOCARDIOGRAM (TEE);  Surgeon: Prescott Gum, Collier Salina, MD;   Location: [place];  Service: Thoracic;  Laterality: N/A;  . TEMPORAL ARTERY BIOPSY / LIGATION Bilateral 2011  . TONSILLECTOMY AND ADENOIDECTOMY     at age 21  . TUBAL LIGATION  ~ 1982  . X-STOP IMPLANTATION  ~ 2010   "lower back" (10/28/2012)   HPI:  84 y.o. female who was admitted 12/7 with undifferentiated shock,, pericardial tamponade status post pericardial window; 03/11/19 CXR revealed Unchanged appearance of the chest x-ray, with mixed interstitial and airspace disease at the lung bases, potentially combination of atelectasis/consolidation and edema  Assessment / Plan / Recommendation Clinical Impression  Pt with expiratory wheeze after all intake that was not present prior to BSE completion.  Pt stated she has vocal cord dysfunction and has had coughing during intake and expiratory wheezing for an extended period of time (diagnosed 15 years ago at [facility] clinic after complete work-up per pt); denies frequent PNA ; discussed potential MBSS while in acute setting to r/o aspiration and determine if any swallowing precautions/strategies would be beneficial after ST f/u for diet tolerance/strategies at bedside.  Pt in agreement with plan; continue soft diet/thin liquids; thank you for this consult. SLP Visit Diagnosis: Dysphagia, pharyngeal phase (R13.13)    Aspiration Risk  Mild aspiration risk;Moderate aspiration risk    Diet Recommendation   Soft (regular)/thin liquids; general swallowing precautions  Medication Administration: Whole meds with liquid(as able )    Other  Recommendations Oral Care Recommendations: Oral care BID   Follow up Recommendations Other (comment)(TBD)      Frequency and Duration min 2x/week  1 week       Prognosis Prognosis for Safe Diet Advancement: Good      Swallow Study   General Date of Onset: 03/06/19 HPI: 84 y.o. female who was admitted 12/7 with undifferentiated shock,, pericardial tamponade status post pericardial window Type of Study: Bedside  Swallow Evaluation Diet Prior to this Study: Regular;Thin liquids Temperature Spikes Noted: No Respiratory Status: Nasal cannula;Other (comment)(2L) History of Recent Intubation: Yes Length of Intubations (days): (surgery only) Date extubated: 03/08/19 Behavior/Cognition: Alert;Cooperative;Pleasant mood Oral Cavity Assessment: Within Functional Limits Oral Care Completed by SLP: No Oral Cavity - Dentition: Adequate natural dentition Vision: Functional for self-feeding Self-Feeding Abilities: Able to feed self Patient Positioning: Upright in bed;Other (comment)(as high as able d/t lower back pain) Baseline Vocal Quality: Normal Volitional Cough: Strong Volitional Swallow: Able to elicit    Oral/Motor/Sensory Function Overall Oral Motor/Sensory Function: Within functional limits   Ice Chips Ice chips: Impaired Presentation: Spoon;Self Fed Pharyngeal Phase Impairments: Other (comments)(expiratory wheeze noted after intake)   Thin Liquid Thin Liquid: Impaired Presentation: Cup;Straw Pharyngeal  Phase Impairments: Other (comments)(expiratory wheeze noted after intake)    Nectar Thick Nectar Thick Liquid: Not tested   Honey Thick Honey Thick Liquid: Not tested   Puree Puree: Impaired Presentation: Self Fed Pharyngeal Phase Impairments: Other (comments)(expiratory wheeze noted after intake)   Solid     Solid: Within functional limits  Presentation: Self Ronelle Nigh, M.S., CCC-SLP 03/12/2019,12:08 PM

## 2019-03-12 NOTE — Progress Notes (Signed)
PT Cancellation Note  Patient Details Name: Betty Jackson MRN: [id number] DOB: [birth year]   Cancelled Treatment:    Reason Eval/Treat Not Completed: Other (comment) Attempted to see pt this PM however pt getting a bath. Will follow.   Marguarite Arbour [name] 03/12/2019, 2:31 PM [name], PT, DPT Acute Rehabilitation Services Pager [phone] Office [phone]

## 2019-03-13 DIAGNOSIS — R0789 Other chest pain: Secondary | ICD-10-CM

## 2019-03-13 LAB — AEROBIC/ANAEROBIC CULTURE W GRAM STAIN (SURGICAL/DEEP WOUND)
Culture: NO GROWTH
Gram Stain: NONE SEEN

## 2019-03-13 LAB — GLUCOSE, CAPILLARY
Glucose-Capillary: 86 mg/dL (ref 70–99)
Glucose-Capillary: 141 mg/dL — ABNORMAL HIGH (ref 70–99)

## 2019-03-13 LAB — ANAEROBIC CULTURE

## 2019-03-13 MED ORDER — AMIODARONE HCL 200 MG PO TABS: 200.0000 mg | ORAL_TABLET | Freq: Every day | ORAL | 0 refills | Status: DC

## 2019-03-13 MED ORDER — FUROSEMIDE 20 MG PO TABS: 40.0000 mg | ORAL_TABLET | Freq: Two times a day (BID) | ORAL | 0 refills | Status: DC

## 2019-03-13 NOTE — Progress Notes (Signed)
Discharge instructions given to patient. Medications reviewed. CVC removed per protocol. All questions answered. Pt escorted home by daughter.  Arletta Bale, RN

## 2019-03-13 NOTE — Progress Notes (Signed)
Physical Therapy Treatment Patient Details Name: Betty Jackson MRN: [id number] DOB: 1934/11/10 Today's Date: 03/13/2019    History of Present Illness 84 year old with with PMH RA, A-fib, CAD, LBP, scoliosis, DDD, CVA, CKD, admitted with shock secondary to adrenal insufficiency, pericardial effusion status post pericardial window, atrial fibrillation    PT Comments    Pt with improved ambulation tolerance and didn't require O2 supplementation. Pt con't to have generalized weakness and decreased endurance as ambulation limited by onset of fatigue. Pt to benefit from continued HHPT up on d/c to progress ambulation and independence. Acute PT to cont to follow.    Follow Up Recommendations  Home health PT;Supervision/Assistance - 24 hour(continue with HHPT)     Equipment Recommendations  None recommended by PT    Recommendations for Other Services       Precautions / Restrictions Precautions Precautions: Fall Precaution Comments: 2 recent falls with vertebral compression fx and sacral and ischial tuberosity fx's Restrictions Weight Bearing Restrictions: No    Mobility  Bed Mobility Overal bed mobility: Needs Assistance Bed Mobility: Rolling;Sidelying to Sit;Sit to Supine Rolling: Min guard Sidelying to sit: Min assist   Sit to supine: Min assist   General bed mobility comments: directional verbal cues for log roll technique as pt with back fracture, pt appreciated education on log roll  Transfers Overall transfer level: Needs assistance Equipment used: Rolling walker (2 wheeled) Transfers: Sit to/from Stand Sit to Stand: Min guard         General transfer comment: verbal cues to push up from bed not from RW, increased time but no physical assist needed, unable to achieve full upright standing due to back pain and back fracture  Ambulation/Gait Ambulation/Gait assistance: Min guard Gait Distance (Feet): 60 Feet Assistive device: Rolling walker (2 wheeled) Gait  Pattern/deviations: Step-through pattern;Decreased stride length;Trunk flexed Gait velocity: red Gait velocity interpretation: <1.31 ft/sec, indicative of household ambulator General Gait Details: minA for walker management during turning, unable to achieve full upright standing/posture due to back pain, pt c/o fatigue, SpO2 dropped to 88% from 98% on RA.    Stairs             Wheelchair Mobility    Modified Rankin (Stroke Patients Only)       Balance Overall balance assessment: Needs assistance Sitting-balance support: No upper extremity supported;Feet supported Sitting balance-[name]: Good     Standing balance support: Bilateral upper extremity supported Standing balance-[name]: Fair Standing balance comment: minG with BUE support of RW                            Cognition Arousal/Alertness: Awake/alert Behavior During Therapy: WFL for tasks assessed/performed Overall Cognitive Status: Within Functional Limits for tasks assessed                                        Exercises      General Comments General comments (skin integrity, edema, etc.): Spo2 >88% on RA during amb      Pertinent Vitals/Pain Pain Assessment: Faces Faces Pain Scale: Hurts little more Pain Location: back Pain Descriptors / Indicators: Dull Pain Intervention(s): Limited activity within patient's tolerance    Home Living                      Prior Function  PT Goals (current goals can now be found in the care plan section) Acute Rehab PT Goals Patient Stated Goal: home today Progress towards PT goals: Progressing toward goals    Frequency    Min 3X/week      PT Plan Current plan remains appropriate    Co-evaluation              AM-PAC PT "6 Clicks" Mobility   Outcome Measure  Help needed turning from your back to your side while in a flat bed without using bedrails?: A Little Help needed moving from lying  on your back to sitting on the side of a flat bed without using bedrails?: A Little Help needed moving to and from a bed to a chair (including a wheelchair)?: A Little Help needed standing up from a chair using your arms (e.g., wheelchair or bedside chair)?: A Little Help needed to walk in hospital room?: A Little Help needed climbing 3-5 steps with a railing? : A Lot 6 Click Score: 17    End of Session   Activity Tolerance: Patient tolerated treatment well Patient left: in bed;with call bell/phone within reach Nurse Communication: Mobility status PT Visit Diagnosis: Muscle weakness (generalized) (M62.81);Other abnormalities of gait and mobility (R26.89);History of falling (Z91.81);Pain     Time: [id number] PT Time Calculation (min) (ACUTE ONLY): 24 min  Charges:  $Gait Training: 23-37 mins                     Kittie Plater, PT, DPT Acute Rehabilitation Services Pager #: [phone] Office #: [phone]    Berline Lopes 03/13/2019, 10:02 AM

## 2019-03-13 NOTE — Progress Notes (Signed)
Central line removed per order. Pt informed of bedrest for 1 hour. All questions answered. Call bell in reach.  Arletta Bale, RN

## 2019-03-13 NOTE — Progress Notes (Signed)
5 Days Post-Op Procedure(s) (LRB): SUBXYPHOID PERICARDIAL WINDOW (N/A) TRANSESOPHAGEAL ECHOCARDIOGRAM (TEE) (N/A) Chest Tube Insertion (Left) Subjective: Anxious to go home All cultures of pericardial tissue and fluid are negative c/w viral pericarditis Incision clean , dry Last CXR with mild L effusion  Objective: Vital signs in last 24 hours: Temp:  [98.2 F (36.8 C)-98.8 F (37.1 C)] 98.4 F (36.9 C) (12/14 0800) Pulse Rate:  [64-73] 70 (12/14 1110) Cardiac Rhythm: Normal sinus rhythm;Heart block (12/14 0801) Resp:  [13-20] 19 (12/14 1110) BP: (129-151)/(60-72) 137/62 (12/14 1110) SpO2:  [94 %-97 %] 95 % (12/14 1110) Weight:  [76.9 kg] 76.9 kg (12/14 0222)  Hemodynamic parameters for last 24 hours:    Intake/Output from previous day: 12/13 0701 - 12/14 0700 In: -  Out: 900 [Urine:900] Intake/Output this shift: Total I/O In: 120 [P.O.:120] Out: -   Lugs clear Incision healing No pericardial rub  Lab Results: No results for input(s): WBC, HGB, HCT, PLT in the last 72 hours. BMET:  Recent Labs    03/11/19 0257  NA 141  K 3.8  CL 107  CO2 25  GLUCOSE 96  BUN 23  CREATININE 1.13*  CALCIUM 8.1*    PT/INR: No results for input(s): LABPROT, INR in the last 72 hours. ABG    Component Value Date/Time   PHART 7.415 03/09/2019 0349   HCO3 21.3 03/09/2019 0349   TCO2 22 03/09/2019 0349   ACIDBASEDEF 3.0 (H) 03/09/2019 0349   O2SAT 96.0 03/09/2019 0349   CBG (last 3)  Recent Labs    03/12/19 2227 03/13/19 0637 03/13/19 1103  GLUCAP 125* 86 141*    Assessment/Plan: S/P Procedure(s) (LRB): SUBXYPHOID PERICARDIAL WINDOW (N/A) TRANSESOPHAGEAL ECHOCARDIOGRAM (TEE) (N/A) Chest Tube Insertion (Left) Will follow pt in office after discharge DC instructions reviewed with patient   LOS: 7 days    Betty Jackson 03/13/2019

## 2019-03-13 NOTE — TOC Transition Note (Signed)
Transition of Care [facility]) - CM/SW Discharge Note   Patient Details  Name: ISHRAT ROMOS MRN: [id number] Date of Birth: 10/05/1934  Transition of Care [facility]) CM/SW Contact:  Carles Collet, RN Phone Number: 03/13/2019, 10:04 AM   Clinical Narrative:    Confirmed w patient and liaison she is active w [name].  SOC in 48-72 hours for PT HHA SLP.     Final next level of care: [place] Barriers to Discharge: No Barriers Identified   Patient Goals and CMS Choice Patient states their goals for this hospitalization and ongoing recovery are:: to go home CMS Medicare.gov Compare Post Acute Care list provided to:: Patient Choice offered to / list presented to : Patient  Discharge Placement                       Discharge Plan and Services   Discharge Planning Services: CM Consult                      HH Arranged: PT, Nurse's Aide, Speech Therapy HH Agency: [place] Date [place]: 03/13/19 Time [place]: 1003 Representative spoke with at [place]: cory  Social Determinants of Health ([place]) Interventions     Readmission Risk Interventions Readmission Risk Prevention Plan 03/08/2019  Medication Review (RN Care Manager) Complete  Some recent data might be hidden

## 2019-03-13 NOTE — Discharge Instructions (Signed)

## 2019-03-13 NOTE — Consult Note (Signed)
   [facility] CM Inpatient Consult   03/13/2019  Betty Jackson 1934/11/26 [id number]   Patient screened for high risk score for unplanned readmission score in the [employer] in the [place] and for hospitalizations.   Primary Care Provider is  Carolann Littler, MD with [facility], this office is listed to provide the transition of care [TOC].  Plan:  Review of the  inpatient [facility] team notes reveals patient will continue with Home Health with [facility]. No additional needs assessed.  Please place a [facility] Care Management consult as appropriate and for questions contact:   [name], RN BSN [facility] Liaison  [phone] business mobile phone Toll free office [phone]  Fax number: [phone] [email or web address]

## 2019-03-13 NOTE — Progress Notes (Signed)
  Speech Language Pathology Treatment: Dysphagia  Patient Details Name: Betty Jackson MRN: [id number] DOB: 24-Mar-1935 Today's Date: 03/13/2019 Time: 0920-0928 SLP Time Calculation (min) (ACUTE ONLY): 8 min  Assessment / Plan / Recommendation Clinical Impression  Pt was encountered awake/alert lying reclined in bed and she was agreeable to ST treatment session.  Pt reported that she had been tolerating her current diet of regular solids and thin liquid without difficulty and she denied wheezing after po intake.  Pt was observed with trials of regular solids (cracker) and thin liquid via cup sip.  She independently took small, single bites and sips and no clinical s/sx of aspiration or respiratory distress (including wheezing) were observed despite challenging.  Mastication of regular solids was mildly prolonged but effective, and pt independently cleared trace oral residue on posterior lingual surface with a liquid wash.  Pt was re-educated regarding compensatory strategies during po intake including: 1) Small bites/sips, 2) Slow rate of intake 3) Sit upright 90 degrees.  Pt verbalized understanding.  Recommend continuation of regular solids and thin liquid with use of compensatory strategies.  No further skilled ST is warranted at this time.  Please re-consult if additional needs arise.     HPI HPI: 84 y.o. female who was admitted 12/7 with undifferentiated shock,, pericardial tamponade status post pericardial window      SLP Plan  All goals met - discharge from ST        Recommendations  Diet recommendations: Regular;Thin liquid Liquids provided via: Cup;Straw Medication Administration: Whole meds with liquid Supervision: Patient able to self feed Compensations: Slow rate;Small sips/bites Postural Changes and/or Swallow Maneuvers: Seated upright 90 degrees                Oral Care Recommendations: Oral care BID Follow up Recommendations: None SLP Visit Diagnosis: Dysphagia,  unspecified (R13.10) Plan: All goals met       GO               Betty Jackson., M.S., [place] Acute Rehabilitation Services Office: [phone]  [place] 03/13/2019, 9:38 AM

## 2019-03-13 NOTE — Discharge Summary (Signed)
Discharge Summary  Betty Jackson [id number] DOB: 09/02/1934  PCP: Eulas Post, MD  Admit date: 03/06/2019 Discharge date: 03/13/2019  Recommendations for Outpatient Follow-up:  1. PCP in 2 weeks 2. Cardiology in 2 weeks 3. CT surgery call for appointment if needed   Discharge Diagnoses:  Active Hospital Problems   Diagnosis Date Noted   Cardiac tamponade    Pericarditis 03/06/2019   Fever    Septic shock (HCC)    Atypical chest pain    Sepsis ([place]) 05/20/2017    Resolved Hospital Problems  No resolved problems to display.    Discharge Condition: Stable   Diet recommendation: Low salt   Vitals:   03/13/19 0026 03/13/19 0431  BP: 129/70 136/60  Pulse: 70 64  Resp: 20 13  Temp: 98.5 F (36.9 C) 98.2 F (36.8 C)  SpO2: 95% 94%    HPI and Brief Hospital Course:  84 year old female with long-term glucocorticoid use for temporal arteritis and PMR, atrial fibrillation Eliquis, chronic back pain and recent admission for diverticulitis admitted 12/7 after being found to have pericardial effusion without tamponade.   Discharge details, plan of care and follow up instructions were discussed with patient and any available family or care providers. Patient and family are in agreement with discharge from the hospital today and all questions were answered to their satisfaction.  Details of Hospital Course:  84 year old female with long-term glucocorticoid use for temporal arteritis and PMR, atrial fibrillation Eliquis, chronic back pain and recent admission for diverticulitis.  She presented to the emergency room March 06, 2019 with dysphagia for several days with nausea vomiting and diarrhea she was found to be febrile with tachycardia and hypotension in the ED she was diagnosed with pericardial effusion she was then evaluated by CT surgery and they felt that there was no tamponade.  Patient underwent pericardial window on March 08, 2019.  She did well cultures  are negative and this is most likely viral pericarditis. Due to CXR findings and possible fevers she was treated with Zosyn but this was stopped on 12/11 the same day her pericardial drain was removed.  Since then she has been on room air doing well worked with PT they recommend HHPT which has been ordered. She lives with her husband and sister has also come from out of town to help take care of her. The last few days her prednisone which had been increased to stress dosing was put back to her home dose. She was continued on her Eliquis and also started on Amiodarone for rapid Afib and now is in sinus rhythm will continue the Amiodarone at DC today. She is improved today after lasix diuresis and will go home with increased dose of Lasix as well and patient was advised to follow up with Cardiology/PCP in next 7 days to evaluated need for adjusting Lasix dose.  Procedures:  Pericardial window 12/9, drain removed 12/11   Consultations:  CT surgery  Cardiology   Discharge Exam: BP 136/60 (BP Location: Right Arm)    Pulse 64    Temp 98.2 F (36.8 C) (Oral)    Resp 13    Ht 5' 4.5" (1.638 m)    Wt 76.9 kg    SpO2 94%    BMI 28.66 kg/m  General:  Alert, oriented, calm, in no acute distress  Eyes: EOMI, clear sclerea Neck: supple, no masses, trachea mildline  Cardiovascular: RRR, no murmurs or rubs, no peripheral edema  Respiratory: clear to auscultation bilaterally, no wheezes,  no crackles  Abdomen: soft, nontender, nondistended, normal bowel tones heard  Skin: dry, no rashes  Musculoskeletal: no joint effusions, normal range of motion  Psychiatric: appropriate affect, normal speech  Neurologic: extraocular muscles intact, clear speech, moving all extremities with intact sensorium    Discharge Instructions You were cared for by a hospitalist during your hospital stay. If you have any questions about your discharge medications or the care you received while you were in the hospital after you are  discharged, you can call the unit and asked to speak with the hospitalist on call if the hospitalist that took care of you is not available. Once you are discharged, your primary care physician will handle any further medical issues. Please note that NO REFILLS for any discharge medications will be authorized once you are discharged, as it is imperative that you return to your primary care physician (or establish a relationship with a primary care physician if you do not have one) for your aftercare needs so that they can reassess your need for medications and monitor your lab values.  Discharge Instructions    Diet - low sodium heart healthy   Complete by: As directed    Increase activity slowly   Complete by: As directed      Allergies as of 03/13/2019      Reactions   Dextromethorphan Rash      Medication List    STOP taking these medications   amoxicillin 500 MG capsule Commonly known as: AMOXIL   losartan 100 MG tablet Commonly known as: COZAAR   losartan 25 MG tablet Commonly known as: COZAAR     TAKE these medications   amiodarone 200 MG tablet Commonly known as: PACERONE Take 1 tablet (200 mg total) by mouth daily.   cholecalciferol 1000 units tablet Commonly known as: VITAMIN D Take 1,000 Units by mouth daily.   dicyclomine 20 MG tablet Commonly known as: BENTYL Take 1 tablet (20 mg total) by mouth 2 (two) times daily as needed for spasms.   diltiazem 60 MG tablet Commonly known as: Cardizem Take 1 tablet (60 mg total) by mouth 3 (three) times daily.   Eliquis 5 MG Tabs tablet Generic drug: apixaban TAKE 1 TABLET BY MOUTH TWICE A DAY What changed: how much to take   escitalopram 10 MG tablet Commonly known as: LEXAPRO TAKE 1 TABLET BY MOUTH EVERY DAY   furosemide 20 MG tablet Commonly known as: LASIX Take 2 tablets (40 mg total) by mouth 2 (two) times daily. What changed:   medication strength  how much to take  how to take this  when to take  this  additional instructions   HYDROcodone-acetaminophen 5-325 MG tablet Commonly known as: NORCO/VICODIN Take one tablet by mouth every 8 hours as needed for pain. May refill in two months. What changed:   how much to take  how to take this  when to take this  reasons to take this  additional instructions   metoprolol tartrate 100 MG tablet Commonly known as: LOPRESSOR Take 1 tablet (100 mg total) by mouth 2 (two) times daily.   ondansetron 4 MG disintegrating tablet Commonly known as: Zofran ODT Take 1 tablet (4 mg total) by mouth every 8 (eight) hours as needed for nausea or vomiting.   pantoprazole 40 MG tablet Commonly known as: PROTONIX TAKE 1 TABLET BY MOUTH EVERY DAY   potassium chloride SA 20 MEQ tablet Commonly known as: KLOR-CON Take 1 tablet (20 mEq total) by mouth  daily.   predniSONE 10 MG tablet Commonly known as: DELTASONE 4 tablets for 3 days, 3 tablets for 3 days, 2 tablets for 3 days, 1 tablet continue What changed:   how much to take  how to take this  when to take this  additional instructions   rosuvastatin 10 MG tablet Commonly known as: CRESTOR Take 10 mg by mouth at bedtime.   vitamin C 500 MG tablet Commonly known as: ASCORBIC ACID Take 1,000 mg by mouth daily.   vitamin E 400 UNIT capsule Generic drug: vitamin E Take 400 Units by mouth daily.      Allergies  Allergen Reactions   Dextromethorphan Rash      The results of significant diagnostics from this hospitalization (including imaging, microbiology, ancillary and laboratory) are listed below for reference.    Significant Diagnostic Studies: DG Elbow Complete Left  Result Date: 02/11/2019 CLINICAL DATA:  Left elbow pain after recent fall EXAM: LEFT ELBOW - COMPLETE 3+ VIEW COMPARISON:  None. FINDINGS: There is no evidence of fracture, dislocation, or joint effusion. There is no evidence of arthropathy or other focal bone abnormality. Soft tissues are  unremarkable. IMPRESSION: No left elbow fracture, joint effusion or malalignment. Electronically Signed   By: Ilona Sorrel M.D.   On: 02/11/2019 14:57   CT Head Wo Contrast  Result Date: 02/11/2019 CLINICAL DATA:  Recent fall 5 days prior.  Dizziness. EXAM: CT HEAD WITHOUT CONTRAST TECHNIQUE: Contiguous axial images were obtained from the base of the skull through the vertex without intravenous contrast. COMPARISON:  08/17/2018 head CT. FINDINGS: Brain: No evidence of parenchymal hemorrhage or extra-axial fluid collection. No mass lesion, mass effect, or midline shift. No CT evidence of acute infarction. Nonspecific prominent subcortical and periventricular white matter hypodensity, most in keeping with chronic small vessel ischemic change. Generalized cerebral volume loss. No ventriculomegaly. Vascular: No acute abnormality. Skull: No evidence of calvarial fracture. Sinuses/Orbits: The visualized paranasal sinuses are essentially clear. Other:  The mastoid air cells are unopacified. IMPRESSION: 1. No evidence of acute intracranial abnormality. No evidence of calvarial fracture. 2. Generalized cerebral volume loss and prominent chronic small vessel ischemic changes in the cerebral white matter. Electronically Signed   By: Ilona Sorrel M.D.   On: 02/11/2019 14:38   CT ABDOMEN PELVIS W CONTRAST  Result Date: 03/06/2019 CLINICAL DATA:  Acute generalized abdominal pain with fever. "Esophageal pain" EXAM: CT ABDOMEN AND PELVIS WITH CONTRAST TECHNIQUE: Multidetector CT imaging of the abdomen and pelvis was performed using the standard protocol following bolus administration of intravenous contrast. CONTRAST:  198mL OMNIPAQUE IOHEXOL 300 MG/ML  SOLN COMPARISON:  02/11/2019 FINDINGS: Lower chest: Moderate to large pericardial effusion that is new from prior, up to 2 cm in thickness. Smooth serosal enhancement is seen on both the visceral and parietal surfaces. The ventricular septum appears straight rather than  S-shaped. Mild interstitial coarsening at the lung bases, suspect mild interstitial edema. Coronary calcification. Hepatobiliary: Redemonstrated intra and extrahepatic biliary dilatation. There is perfusion anomaly in the central liver which is nonspecific and also seen previously.Cholecystectomy. No calcified choledocholithiasis. Pancreas: Unremarkable. Spleen: Unremarkable. Adrenals/Urinary Tract: Negative adrenals. No hydronephrosis or stone. Unremarkable bladder. Stomach/Bowel: No obstruction. No appendicitis. Multiple colonic diverticula. Vascular/Lymphatic: No acute vascular abnormality. Atherosclerotic calcification. No flattening of central venous structures. No mass or adenopathy. Reproductive:Hysterectomy. Other: No ascites or pneumoperitoneum. Musculoskeletal: No acute abnormalities. Remote T12 and L1 cement augmentation. L2-L4 fusion with solid arthrodesis. Avulsion fracture with early periosteal reaction at the left ischial tuberosity. Gas  containing right paracentral herniation at L4-5 with implied impingement. Healing right sacral ala insufficiency fracture. Right hip arthroplasty. IMPRESSION: 1. Pericarditis with moderate to large pericardial effusion that is new from CT last month. 2. Subacute left ischial tuberosity avulsion fracture. Stable, subacute to chronic right sacral ala insufficiency fracture. 3. Chronic biliary dilatation post cholecystectomy Electronically Signed   By: Monte Fantasia M.D.   On: 03/06/2019 10:21   CT ABDOMEN PELVIS W CONTRAST  Result Date: 02/11/2019 CLINICAL DATA:  Fall 5 days ago, right-sided tenderness. EXAM: CT ABDOMEN AND PELVIS WITH CONTRAST TECHNIQUE: Multidetector CT imaging of the abdomen and pelvis was performed using the standard protocol following bolus administration of intravenous contrast. CONTRAST:  154mL OMNIPAQUE IOHEXOL 300 MG/ML  SOLN COMPARISON:  01/12/2019 FINDINGS: Lower chest: Descending thoracic aortic and coronary atherosclerosis. Mild  lingular scarring. Hepatobiliary: Stable mild intrahepatic biliary dilatation. Common bile duct 1.1 cm, formerly the same. Cholecystectomy noted. Pancreas: Unremarkable Spleen: Unremarkable Adrenals/Urinary Tract: Portions of the urinary bladder and right distal ureter are obscured by streak artifact from the patient's right hip implant. Small hypodense lesions of the left kidney are likely cysts but technically too small to characterize. Otherwise unremarkable. Stomach/Bowel: Sigmoid colon diverticulosis. Vascular/Lymphatic: Aortoiliac atherosclerotic vascular disease. Reproductive: Uterus absent. Adnexa unremarkable, right adnexa partially obscured by streak artifact. Other: No supplemental non-categorized findings. Musculoskeletal: Vertical bands of sclerosis in both sacral ala, right greater than left, suspicious for sacral insufficiency fracture. Posterolateral rod and pedicle screw fixation L2-L3-L4 bilaterally with posterior decompression. Prior compression fracture and vertebral augmentation at T11. Spondylosis and degenerative disc disease contribute to right foraminal impingement at L1-2 and L5-S1, and left foraminal impingement at T10-11 and T11-12. 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 IMPRESSION: 1. Suspected bilateral sacral insufficiency fractures, right greater than left. 2. Sigmoid colon diverticulosis, prior diverticulitis has resolved. 3. Chronic mild intrahepatic and extrahepatic biliary dilatation, some of which may be a physiologic response to cholecystectomy. 4. Other imaging findings of potential clinical significance: Coronary atherosclerosis. Lumbar spondylosis and degenerative disc disease causing impingement at T10-11, L1-2, L5-S1, and T11-12. Morgagni hernia containing adipose tissue. Aortic Atherosclerosis (ICD10-I70.0). Electronically Signed   By: Van Clines M.D.   On: 02/11/2019 14:48   DG Chest Port 1 View  Result Date:  03/11/2019 CLINICAL DATA:  84 year old female with weakness EXAM: PORTABLE CHEST 1 VIEW COMPARISON:  03/10/2019 FINDINGS: Cardiomediastinal silhouette unchanged in size and contour. Right IJ central venous catheter terminates at the superior cavoatrial junction, unchanged. No pneumothorax. Reticulonodular opacities of the bilateral lungs with retrocardiac opacity and blunting of the bilateral costophrenic angles. Peribronchial thickening. IMPRESSION: Unchanged appearance of the chest x-ray, with mixed interstitial and airspace disease at the lung bases, potentially combination of atelectasis/consolidation and edema. Left pleural effusion persist. Right IJ central venous catheter. Electronically Signed   By: Corrie Mckusick D.O.   On: 03/11/2019 09:24   DG Chest Port 1 View  Result Date: 03/10/2019 CLINICAL DATA:  Chest tube present EXAM: PORTABLE CHEST 1 VIEW COMPARISON:  Yesterday FINDINGS: Stable cardiopericardial enlargement. Small left pleural effusion is unchanged. Streaky density at the lung bases, likely atelectasis. Pericardial drain remains faintly visible. Stable right IJ line positioning IMPRESSION: Stable drain and central line positioning. Stable cardiopericardial enlargement and small left effusion. Electronically Signed   By: Monte Fantasia M.D.   On: 03/10/2019 08:30   DG Chest Port 1 View  Result Date: 03/09/2019 CLINICAL DATA:  Generalized weakness.  Cardiac tamponade. EXAM: PORTABLE CHEST 1 VIEW  COMPARISON:  03/08/2019. FINDINGS: Interim extubation. Right IJ line noted with tip over superior vena cava. Cardiomegaly with pulmonary venous congestion and bilateral pulmonary infiltrates/edema. Small left pleural effusion. Findings suggest CHF. Persistent left base atelectasis and or infiltrate. No pneumothorax. Prior lower thoracic vertebroplasty. IMPRESSION: 1. Interim extubation. Right IJ line noted stable position with tip over SVC. 2. Cardiomegaly with pulmonary venous congestion and  bilateral pulmonary infiltrates/edema. Small left pleural effusions. Findings suggest CHF. 3.  Persistent left base atelectasis and or infiltrate Electronically Signed   By: At[place]   On: 03/09/2019 08:47   DG Chest Port 1 View  Result Date: 03/08/2019 CLINICAL DATA:  Subxiphoid window.  Rule out pneumothorax EXAM: PORTABLE CHEST 1 VIEW COMPARISON:  Earlier today FINDINGS: Endotracheal tube with tip 16 mm above the carina. Right IJ line with tip at the upper cavoatrial junction. Pericardial drain is present. There retrocardiac opacification. Interstitial coarsening. No visible pneumothorax or pneumomediastinum. There is artifact from clothing or bedding over the left base. IMPRESSION: 1. New endotracheal tube with tip 16 mm above the carina. 2. Unremarkable pericardial drain and right IJ catheter. 3. No visible pneumothorax. 4. Continued retrocardiac opacification likely from atelectasis and pleural fluid. Electronically Signed   By: Monte Fantasia M.D.   On: 03/08/2019 11:23   DG Chest Port 1 View  Result Date: 03/08/2019 CLINICAL DATA:  Fever. EXAM: PORTABLE CHEST 1 VIEW COMPARISON:  03/07/2019 FINDINGS: Lungs are adequately inflated and demonstrate stable opacification in the left base/retrocardiac region likely effusion/atelectasis. Likely small amount right pleural fluid. Mild prominence of the central pulmonary vasculature slightly improved and likely improving mild vascular congestion. Mild linear atelectasis left midlung. Stable prominence of the cardiac silhouette due in part to known moderate size effusion remainder of the exam is unchanged. IMPRESSION: 1. Stable left base/retrocardiac opacification likely effusion with atelectasis. Small amount of right pleural fluid. 2. Stable prominence of the cardiac silhouette due in part to known pericardial effusion. Slight improvement mild vascular congestion. Electronically Signed   By: Marin Olp M.D.   On: 03/08/2019 07:10   DG Chest Port  1 View  Result Date: 03/07/2019 CLINICAL DATA:  Fever and shortness of breath with generalized weakness EXAM: PORTABLE CHEST 1 VIEW COMPARISON:  Yesterday FINDINGS: Low volume chest with new interstitial opacity on both sides. There is blunting of the costophrenic sulci and cardiopericardial enlargement. IMPRESSION: Moderate to large pericardial effusion by CT with pericarditis appearance. Worsening interstitial opacity, likely failure. Electronically Signed   By: Monte Fantasia M.D.   On: 03/07/2019 07:13   DG Chest Port 1 View  Result Date: 03/06/2019 CLINICAL DATA:  Chest pain. Generalized weakness. EXAM: PORTABLE CHEST 1 VIEW COMPARISON:  Radiograph 01/13/2019, CT 05/20/2017 FINDINGS: Upper normal heart size. Unchanged mediastinal contours. Multiple overlying monitoring devices project over the left greater than right chest. Minimal streaky atelectasis or scarring in the left mid lung. No acute airspace disease. No pleural fluid or pneumothorax. Degenerative change in the shoulders. IMPRESSION: 1. No acute abnormality. 2. Minimal scarring in the left mid lung. Electronically Signed   By: Keith Rake M.D.   On: 03/06/2019 06:50   DG Hip Unilat W or Wo Pelvis 2-3 Views Left  Result Date: 02/11/2019 CLINICAL DATA:  Bilateral hip pain after recent fall, left greater than right EXAM: DG HIP (WITH OR WITHOUT PELVIS) 2-3V LEFT COMPARISON:  08/17/2018 hip radiographs FINDINGS: Right hip hemiarthroplasty, partially visualized, with no evidence of hardware fracture or loosening. No pelvic fracture or diastasis. No  left hip fracture or dislocation. Stable mild left hip osteoarthritis. Excreted contrast noted in the bladder. Partially visualized bilateral posterior spinal fusion hardware in the lower lumbar spine. No suspicious focal osseous lesions. IMPRESSION: No pelvic fracture. No left hip dislocation. Stable mild left hip osteoarthritis. Partially visualized right hip hemiarthroplasty. Electronically  Signed   By: Ilona Sorrel M.D.   On: 02/11/2019 14:56   ECHOCARDIOGRAM LIMITED  Result Date: 03/09/2019   ECHOCARDIOGRAM LIMITED REPORT   Patient Name:   Betty Jackson Date of Exam: 03/09/2019 Medical Rec #:  [id number]    Height:       64.5 in Accession #:    [id number]   Weight:       179.6 lb Date of Birth:  March 07, 1935    BSA:          1.88 m Patient Age:    6 years     BP:           134/108 mmHg Patient Gender: F            HR:           130 bpm. Exam Location:  Inpatient  Procedure: Limited Echo Indications:    Pericardial effusion, pericarditis  History:        Patient has prior history of Echocardiogram examinations, most                 recent 03/08/2019. Post-op pericardial window, Pericarditis,                 pleural effusion.  Sonographer:    Dustin Flock Referring Phys: [place] Comments: Surgical dressings. IMPRESSIONS  1. Left ventricular ejection fraction, by visual estimation, is 45 to 50%. The left ventricle has mildly decreased function. There is no left ventricular hypertrophy.  2. The left ventricle demonstrates regional wall motion abnormalities.  3. Global right ventricle has normal systolic function.The right ventricular size is normal.  4. Moderate pleural effusion in the left lateral region.  5. Limited study to assess pericardial effusion; apical akinesis with overall mildly reduced LV function; no pericardial effusion; compared to 03/07/19. apical wall motion abnormality new and pericardial effusion resolved. FINDINGS  Left Ventricle: Left ventricular ejection fraction, by visual estimation, is 45 to 50%. The left ventricle has mildly decreased function. The left ventricle demonstrates regional wall motion abnormalities. There is no left ventricular hypertrophy. Right Ventricle: The right ventricular size is normal.Global RV systolic function is has normal systolic function. Left Atrium: Left atrial size was not assessed. Right Atrium: Right atrial size was  not assessed Pericardium: There is no evidence of pericardial effusion. There is a moderate pleural effusion in the left lateral region. Mitral Valve: The mitral valve was not assessed. Tricuspid Valve: The tricuspid valve is not assessed. Aortic Valve: The aortic valve was not assessed. Pulmonic Valve: The pulmonic valve was not assessed. Aorta: Aortic root could not be assessed. Shunts: The interatrial septum was not assessed.  LEFT VENTRICLE         Normals PLAX 2D LVIDd:         4.50 cm 3.6 cm LVIDs:         2.90 cm 1.7 cm LV PW:         1.00 cm 1.4 cm LV IVS:        1.00 cm 1.3 cm LV SV:         60 ml   79 ml LV SV Index:  30.86   45 ml/m2   Kirk Ruths MD Electronically signed by Kirk Ruths MD Signature Date/Time: 03/09/2019/4:35:37 PM    Final    ECHOCARDIOGRAM LIMITED  Result Date: 03/07/2019   ECHOCARDIOGRAM LIMITED REPORT   Patient Name:   Betty Jackson Date of Exam: 03/07/2019 Medical Rec #:  [id number]    Height:       64.5 in Accession #:    [id number]   Weight:       164.5 lb Date of Birth:  1934/11/09    BSA:          1.81 m Patient Age:    59 years     BP:           139/85 mmHg Patient Gender: F            HR:           82 bpm. Exam Location:  Inpatient  Procedure: Limited Echo STAT ECHO Indications:    Pericardial effusion 423.9 / I31.3  History:        Patient has prior history of Echocardiogram examinations, most                 recent 03/07/2019. Septic shock                 CKD.  Sonographer:    Vikki Ports [place] Referring Phys: [place]  1. There is a moderate pericardial effusion (up to 1.3 cm diameter near apex and along RV free wall) noted with fibrinous material adherent to the epicardium versus prominent epicardial adipose tissue. There is clear > 25% respirophasic variation of mitral E inflow velocity and respirophasic motion of the interventricular septum, suggesting ventricular interdependence. There does not appear to be definite RV diastolic collapse.  RA indentation is noted. The IVC is dilated. This looks like a tamponade  versus developing effusive/constrictive pericarditis picture.  2. Left ventricular ejection fraction, by visual estimation, is 60 to 65%. The left ventricle has normal function. There is no left ventricular hypertrophy.  3. Left ventricular diastolic parameters are consistent with Grade II diastolic dysfunction (pseudonormalization).  4. The left ventricle has no regional wall motion abnormalities  5. Global right ventricle has normal systolic function.The right ventricular size is normal. No increase in right ventricular wall thickness.  6. Right atrial size was mildly dilated.  7. Mildly dilated left atrium.  8. Mild mitral annular calcification.  9. The mitral valve is normal in structure. No evidence of mitral valve regurgitation. 10. The tricuspid valve is normal in structure. Tricuspid valve regurgitation is trivial. 11. The aortic valve is tricuspid. Aortic valve regurgitation is not visualized. No evidence of aortic valve sclerosis or stenosis. 12. The inferior vena cava is dilated in size with <50% respiratory variability, suggesting right atrial pressure of 15 mmHg. 13. The tricuspid regurgitant velocity is 1.91 m/s, and with an assumed right atrial pressure of 3 mmHg, the estimated right ventricular systolic pressure is normal at 17.6 mmHg. FINDINGS  Left Ventricle: Left ventricular ejection fraction, by visual estimation, is 60 to 65%. The left ventricle has normal function. The left ventricle has no regional wall motion abnormalities. The left ventricular internal cavity size was the left ventricle is normal in size. There is no left ventricular hypertrophy. Left ventricular diastolic parameters are consistent with Grade II diastolic dysfunction (pseudonormalization). Right Ventricle: The right ventricular size is normal. No increase in right ventricular wall thickness. Global RV systolic function is has normal  systolic function.  The tricuspid regurgitant velocity is 1.91 m/s, and with an assumed right atrial pressure  of 3 mmHg, the estimated right ventricular systolic pressure is normal at 17.6 mmHg. Left Atrium: Left atrial size was not assessed. Mildly dilated left atrium. Right Atrium: Right atrial size was mildly dilated Pericardium: A moderately sized pericardial effusion is present. There is a moderate pericardial effusion (up to 1.3 cm diameter near apex and along RV free wall) noted with fibrinous material adherent to the epicardium versus prominent epicardial adipose tissue. There is clear > 25% respirophasic variation of mitral E inflow velocity and respirophasic motion of the interventricular septum, suggesting ventricular interdependence. There does not appear to be definite RV diastolic collapse. RA indentation is noted. The IVC is dilated. This looks like a tamponade versus developing effusive/constrictive pericarditis picture. Mitral Valve: The mitral valve is normal in structure. Mild mitral annular calcification. MV Area by PHT, 4.06 cm. MV PHT, 54.23 msec. No evidence of mitral valve regurgitation. Tricuspid Valve: The tricuspid valve is normal in structure. Tricuspid valve regurgitation is trivial. Aortic Valve: The aortic valve is tricuspid. Aortic valve regurgitation is not visualized. The aortic valve is structurally normal, with no evidence of sclerosis or stenosis. Pulmonic Valve: The pulmonic valve was not assessed. Pulmonic valve regurgitation is not visualized. Aorta: The aortic root is normal in size and structure. Venous: The inferior vena cava is dilated in size with less than 50% respiratory variability, suggesting right atrial pressure of 15 mmHg. Shunts: No atrial level shunt detected by color flow Doppler.  LEFT VENTRICLE         Normals PLAX 2D LVIDd:         4.70 cm 3.6 cm   Diastology                 Normals LVIDs:         3.50 cm 1.7 cm   LV e' lateral:   5.87 cm/s 6.42 cm/s LV PW:         1.00 cm 1.4  cm   LV E/e' lateral: 16.9      15.4 LV IVS:        1.00 cm 1.3 cm   LV e' medial:    6.85 cm/s 6.96 cm/s LV SV:         51 ml   79 ml    LV E/e' medial:  14.5      6.96 LV SV Index:   27.66   45 ml/m2  LEFT ATRIUM         Index      RIGHT ATRIUM           Index LA diam:    4.60 cm 2.54 cm/m RA Area:     20.40 cm                                RA Volume:   58.50 ml  32.32 ml/m   AORTA                 Normals Ao Root diam: 3.20 cm 31 mm MITRAL VALVE              Normals   TRICUSPID VALVE             Normals MV Area (PHT): 4.06 cm             TR Peak grad:   14.6 mmHg  MV PHT:        54.23 msec 55 ms     TR Vmax:        191.00 cm/s 288 cm/s MV Decel Time: 187 msec   187 ms MV E velocity: 99.00 cm/s 103 cm/s MV A velocity: 76.30 cm/s 70.3 cm/s MV E/A ratio:  1.30       1.5  Loralie Champagne MD Electronically signed by Loralie Champagne MD Signature Date/Time: 03/07/2019/10:26:55 AM    Final    ECHOCARDIOGRAM LIMITED  Result Date: 03/06/2019   ECHOCARDIOGRAM LIMITED REPORT   Patient Name:   Betty Jackson Date of Exam: 03/06/2019 Medical Rec #:  [id number]    Height:       64.5 in Accession #:    [id number]   Weight:       160.0 lb Date of Birth:  1935/02/09    BSA:          1.79 m Patient Age:    89 years     BP:           81/55 mmHg Patient Gender: F            HR:           154 bpm. Exam Location:  Inpatient  Procedure: Limited Echo Indications:    Tamponade  History:        Patient has prior history of Echocardiogram examinations, most                 recent 01/18/2019. GERD. Tachycardia. CVA.  Sonographer:    Paulita Fujita RDCS Referring Phys: [place]  1. Hyperdynamic LV function, with chamber obliteration in systole in distal LV chamber. Endocardial borders incompletely seen, but overall no large focal wall motion abnormalities. There is a moderate to large pericardial effusion, greater since last study and most prominent along the RA-RV wall. There also appears to be a prominent fat pad vs.  adherent materal across the epicardial surface diffusely. This is simialr to prior. IVC is not dilated but also collapses less than 50% (1.79 cm -> 1.36 cm). No mitral or tricuspid inflow velocities taken. The RA and RV do appear to have partial collapse, appeara respiratory related. Very rapid tachycardia precludes further analysis. Overall it is concerning for tamponade (RA/RV motion). Lack of IVC dialtion could be due to hypovolemia based on LV hyperdynamic motion, so would not use that to exclude tamponade. Recommend clinical correlation.  2. Left ventricular ejection fraction, by visual estimation, is 70 to 75%. The left ventricle has hyperdynamic function. There is no left ventricular hypertrophy.  3. Left ventricular diastolic function could not be evaluated.  4. Small left ventricular internal cavity size.  5. The left ventricle has no regional wall motion abnormalities.  6. Global right ventricle has normal systolic function.The right ventricular size is normal. No increase in right ventricular wall thickness.  7. Left atrial size was not assessed.  8. Right atrial size was not assessed.  9. Moderate pericardial effusion. 10. Presence of pericardial fat pad. 11. The mitral valve was not assessed. NA mitral valve regurgitation. 12. The tricuspid valve is not assessed. Tricuspid valve regurgitation NA. 13. Aortic valve regurgitation NA. 14. The aortic valve was not assessed. Aortic valve regurgitation NA. 15. The pulmonic valve was not assessed. Pulmonic valve regurgitation NA. 16. Aortic root could not be assessed. 17. The inferior vena cava is normal in size with <50% respiratory variability, suggesting right atrial pressure of 8 mmHg.  18. The interatrial septum was not assessed. FINDINGS  Left Ventricle: Left ventricular ejection fraction, by visual estimation, is 70 to 75%. The left ventricle has hyperdynamic function. The left ventricle has no regional wall motion abnormalities. The left ventricular  internal cavity size was the LV cavity size is small. There is no left ventricular hypertrophy. Left ventricular diastolic function could not be evaluated. Right Ventricle: The right ventricular size is normal. No increase in right ventricular wall thickness. Global RV systolic function is has normal systolic function. Left Atrium: Left atrial size was not assessed. Right Atrium: Right atrial size was not assessed Pericardium: A moderately sized pericardial effusion is present. The pericardial effusion appears to contain mixed echogenic material. Presence of pericardial fat pad. Mitral Valve: The mitral valve was not assessed. NA mitral valve regurgitation. Tricuspid Valve: The tricuspid valve is not assessed. Tricuspid valve regurgitation NA. Aortic Valve: The aortic valve was not assessed. Aortic valve regurgitation NA. Pulmonic Valve: The pulmonic valve was not assessed. Pulmonic valve regurgitation NA. Aorta: Aortic root could not be assessed. Venous: The inferior vena cava is normal in size with less than 50% respiratory variability, suggesting right atrial pressure of 8 mmHg. Shunts: The interatrial septum was not assessed.  Buford Dresser MD Electronically signed by Buford Dresser MD Signature Date/Time: 03/06/2019/11:52:53 AM    Final     Microbiology: Recent Results (from the past 240 hour(s))  SARS CORONAVIRUS 2 (TAT 6-24 HRS) Nasopharyngeal Nasopharyngeal Swab     Status: None   Collection Time: 03/06/19  6:37 AM   Specimen: Nasopharyngeal Swab  Result Value Ref Range Status   SARS Coronavirus 2 NEGATIVE NEGATIVE Final    Comment: (NOTE) SARS-CoV-2 target nucleic acids are NOT DETECTED. The SARS-CoV-2 RNA is generally detectable in upper and lower respiratory specimens during the acute phase of infection. Negative results do not preclude SARS-CoV-2 infection, do not rule out co-infections with other pathogens, and should not be used as the sole basis for treatment or other  patient management decisions. Negative results must be combined with clinical observations, patient history, and epidemiological information. The expected result is Negative. Fact Sheet for Patients: SugarRoll.be Fact Sheet for Healthcare Providers: https://www.woods-mathews.com/ This test is not yet approved or cleared by the [place] FDA and  has been authorized for detection and/or diagnosis of SARS-CoV-2 by FDA under an Emergency Use Authorization (EUA). This EUA will remain  in effect (meaning this test can be used) for the duration of the COVID-19 declaration under Section 56 4(b)(1) of the Act, 21 U.S.C. section 360bbb-3(b)(1), unless the authorization is terminated or revoked sooner. Performed at [facility], [place] [address], [place], [place] [address]   Blood Culture (routine x 2)     Status: None   Collection Time: 03/06/19  6:38 AM   Specimen: BLOOD RIGHT HAND  Result Value Ref Range Status   Specimen Description   Final    BLOOD RIGHT HAND Performed at [facility] [address], [place], [place] [address]    Special Requests   Final    BOTTLES DRAWN AEROBIC AND ANAEROBIC Blood Culture adequate volume Performed at [facility] [address], [place], [place] [address]    Culture   Final    NO GROWTH 5 DAYS Performed at [facility], [place] [address] [place], [place] [address]    Report Status 03/11/2019 FINAL  Final  Blood Culture (routine x 2)     Status: None  Collection Time: 03/06/19  6:38 AM   Specimen: BLOOD LEFT HAND  Result Value Ref Range Status   Specimen Description   Final    BLOOD LEFT HAND Performed at [facility] [address] [place], [place] [address]    Special Requests   Final    BOTTLES DRAWN AEROBIC AND ANAEROBIC Blood Culture adequate volume Performed at [facility] [address]  [place], [place] [address]    Culture   Final    NO GROWTH 5 DAYS Performed at [facility], [place] [address], [place], [place] [address]    Report Status 03/11/2019 FINAL  Final  Respiratory Panel by RT PCR (Flu A&B, Covid) - Nasopharyngeal Swab     Status: None   Collection Time: 03/06/19 12:31 PM   Specimen: Nasopharyngeal Swab  Result Value Ref Range Status   SARS Coronavirus 2 by RT PCR NEGATIVE NEGATIVE Final    Comment: (NOTE) SARS-CoV-2 target nucleic acids are NOT DETECTED. The SARS-CoV-2 RNA is generally detectable in upper respiratoy specimens during the acute phase of infection. The lowest concentration of SARS-CoV-2 viral copies this assay can detect is 131 copies/mL. A negative result does not preclude SARS-Cov-2 infection and should not be used as the sole basis for treatment or other patient management decisions. A negative result may occur with  improper specimen collection/handling, submission of specimen other than nasopharyngeal swab, presence of viral mutation(s) within the areas targeted by this assay, and inadequate number of viral copies (<131 copies/mL). A negative result must be combined with clinical observations, patient history, and epidemiological information. The expected result is Negative. Fact Sheet for Patients:  PinkCheek.be Fact Sheet for Healthcare Providers:  GravelBags.it This test is not yet ap proved or cleared by the [place] FDA and  has been authorized for detection and/or diagnosis of SARS-CoV-2 by FDA under an Emergency Use Authorization (EUA). This EUA will remain  in effect (meaning this test can be used) for the duration of the COVID-19 declaration under Section 564(b)(1) of the Act, 21 U.S.C. section 360bbb-3(b)(1), unless the authorization is terminated or revoked sooner.    Influenza A by PCR NEGATIVE NEGATIVE Final   Influenza B by PCR NEGATIVE NEGATIVE Final     Comment: (NOTE) The Xpert Xpress SARS-CoV-2/FLU/RSV assay is intended as an aid in  the diagnosis of influenza from Nasopharyngeal swab specimens and  should not be used as a sole basis for treatment. Nasal washings and  aspirates are unacceptable for Xpert Xpress SARS-CoV-2/FLU/RSV  testing. Fact Sheet for Patients: PinkCheek.be Fact Sheet for Healthcare Providers: GravelBags.it This test is not yet approved or cleared by the [place] FDA and  has been authorized for detection and/or diagnosis of SARS-CoV-2 by  FDA under an Emergency Use Authorization (EUA). This EUA will remain  in effect (meaning this test can be used) for the duration of the  Covid-19 declaration under Section 564(b)(1) of the Act, 21  U.S.C. section 360bbb-3(b)(1), unless the authorization is  terminated or revoked. Performed at [facility], [place] [address], [place], [place] [address]   Urine culture     Status: None   Collection Time: 03/06/19  1:11 PM   Specimen: Urine, Catheterized  Result Value Ref Range Status   Specimen Description   Final    URINE, CATHETERIZED Performed at [facility] [address], [place], [place] [address]    Special Requests   Final    Immunocompromised Performed  at [facility], [place] [address], [place], [place] [address]    Culture   Final    NO GROWTH Performed at [facility], [place] [address] [place], [place] [address]    Report Status 03/07/2019 FINAL  Final  MRSA PCR Screening     Status: None   Collection Time: 03/06/19  5:27 PM   Specimen: Nasopharyngeal  Result Value Ref Range Status   MRSA by PCR NEGATIVE NEGATIVE Final    Comment:        The GeneXpert MRSA Assay (FDA approved for NASAL specimens only), is one component of a comprehensive MRSA colonization surveillance program. It is not intended to diagnose MRSA infection nor to  guide or monitor treatment for MRSA infections. Performed at [facility], [place] [address] [place], [place] [address]   Anaerobic culture     Status: None (Preliminary result)   Collection Time: 03/08/19  9:26 AM   Specimen: Pericardial; Body Fluid  Result Value Ref Range Status   Specimen Description PERICARDIAL  Final   Special Requests   Final    PATIENT ON FOLLOWING ZOSYN VANCOMYCIN Performed at [facility], [place] [address] [place], [place] [address]    Culture   Final    NO ANAEROBES ISOLATED; CULTURE IN PROGRESS FOR 5 DAYS   Report Status PENDING  Incomplete  Body fluid culture     Status: None   Collection Time: 03/08/19  9:26 AM   Specimen: Pericardial; Body Fluid  Result Value Ref Range Status   Specimen Description PERICARDIAL  Final   Special Requests PATIENT ON FOLLOWING ZOSYN VANCOMYCIN  Final   Gram Stain   Final    RARE WBC PRESENT,BOTH PMN AND MONONUCLEAR NO ORGANISMS SEEN    Culture   Final    NO GROWTH 3 DAYS Performed at [facility], [place] [address], [place], [place] [address]    Report Status 03/11/2019 FINAL  Final  Fungus Culture With Stain     Status: None (Preliminary result)   Collection Time: 03/08/19  9:26 AM   Specimen: Pericardial; Body Fluid  Result Value Ref Range Status   Fungus Stain Final report  Final    Comment: (NOTE) Performed At: [facility] [place], [place] [id number] Rush Farmer MD [id number]    Fungus (Mycology) Culture PENDING  Incomplete   Fungal Source PERICARDIAL  Final    Comment: Performed at [facility], [place] [address] [place], [place] [address]  Acid Fast Smear (AFB)     Status: None   Collection Time: 03/08/19  9:26 AM   Specimen: Pericardial; Body Fluid  Result Value Ref Range Status   AFB Specimen Processing Concentration  Final   Acid Fast Smear Negative  Final    Comment: (NOTE) Performed At: [facility] [place], [place]  [id number] Rush Farmer MD [id number]    Source (AFB) PERICARDIAL  Final    Comment: Performed at [facility], [place] [address] [place], [place] [address]  Fungus Culture Result     Status: None   Collection Time: 03/08/19  9:26 AM  Result Value Ref Range Status   Result 1 Comment  Final    Comment: (NOTE) KOH/Calcofluor preparation:  no fungus observed. Performed At: [facility] [place], [place] [id number] Rush Farmer MD [id number]   Fungus Culture With Stain     Status: None (Preliminary result)  Collection Time: 03/08/19  9:32 AM   Specimen: Pericardial; Tissue  Result Value Ref Range Status   Fungus Stain Final report  Final    Comment: (NOTE) Performed At: [facility] [place], [place] [id number] Rush Farmer MD [id number]    Fungus (Mycology) Culture PENDING  Incomplete   Fungal Source TISSUE  Final    Comment: PERICARDIAL Performed at [facility], [place] [address], [place], [place] [address]   Aerobic/Anaerobic Culture (surgical/deep wound)     Status: None (Preliminary result)   Collection Time: 03/08/19  9:32 AM   Specimen: Pericardial; Tissue  Result Value Ref Range Status   Specimen Description TISSUE PERICARDIAL  Final   Special Requests PATIENT ON FOLLOWING ZOSYN VANCOMYCIN  Final   Gram Stain NO WBC SEEN NO ORGANISMS SEEN   Final   Culture   Final    NO GROWTH 4 DAYS NO ANAEROBES ISOLATED; CULTURE IN PROGRESS FOR 5 DAYS Performed at [facility], [place] [address] [place], [place] [address]    Report Status PENDING  Incomplete  Acid Fast Smear (AFB)     Status: None   Collection Time: 03/08/19  9:32 AM   Specimen: Pericardial; Tissue  Result Value Ref Range Status   AFB Specimen Processing Comment  Final    Comment: Tissue Grinding and Digestion/Decontamination   Acid Fast Smear Negative  Final    Comment: (NOTE) Performed At: [facility] [place], [place] [id number] Rush Farmer MD [id number]    Source (AFB) TISSUE  Final    Comment: PERICARDIAL Performed at [facility], [place] [address], [place], [place] [address]   Fungus Culture Result     Status: None   Collection Time: 03/08/19  9:32 AM  Result Value Ref Range Status   Result 1 Comment  Final    Comment: (NOTE) KOH/Calcofluor preparation:  no fungus observed. Performed At: [facility] [place], [place] [id number] Rush Farmer MD [id number]      Labs: Basic Metabolic Panel: Recent Labs  Lab 03/06/19 2135 03/07/19 0419 03/08/19 0410 03/08/19 1603 03/09/19 0339 03/09/19 0349 03/10/19 0452 03/11/19 0257  NA 141 139 139 136 139 137 140 141  K 4.2 4.4 3.2* 3.9 3.7 3.6 3.4* 3.8  CL 106 107 104  --  105  --  104 107  CO2 22 20* 21*  --  21*  --  23 25  GLUCOSE 130* 241* 129*  --  124*  --  99 96  BUN 18 20 23   --  30*  --  29* 23  CREATININE 1.57* 1.49* 1.59*  --  1.89*  --  1.55* 1.13*  CALCIUM 8.2* 7.8* 8.2*  --  8.3*  --  8.1* 8.1*  MG 1.6* 2.2 2.0  --  2.3  --  2.0  --   PHOS  --  5.4*  --   --   --   --   --   --    Liver Function Tests: Recent Labs  Lab 03/10/19 0452  AST 33  ALT 100*  ALKPHOS 98  BILITOT 0.8  PROT 5.1*  ALBUMIN 2.2*   No results for input(s): LIPASE, AMYLASE in the last 168 hours. No results for input(s): AMMONIA in the last 168 hours. CBC: Recent Labs  Lab 03/07/19 0419 03/08/19 0410 03/08/19 1603 03/09/19 0339 03/09/19 0349 03/10/19 0452  WBC 14.2* 12.8*  --  13.2*  --  8.8  HGB 10.0* 8.7* 9.5* 10.1* 9.9* 9.7*  HCT 32.5* 26.3* 28.0* 31.3* 29.0* 30.4*  MCV 97.6 91.6  --  91.5  --  92.7  PLT 183 149*  --  169  --  182   Cardiac Enzymes: No results for input(s): CKTOTAL, CKMB, CKMBINDEX, TROPONINI in the last 168 hours. BNP: BNP (last 3 results) No results for input(s): BNP in the last 8760 hours.  ProBNP (last 3 results) No results for input(s): PROBNP in the last 8760  hours.  CBG: Recent Labs  Lab 03/12/19 0630 03/12/19 1156 03/12/19 1719 03/12/19 2227 03/13/19 0637  GLUCAP 84 95 131* 125* 86    Time spent: 42 minutes were spent in preparing this discharge including medication reconciliation, counseling, and coordination of care.  Signed:  Talyah Seder Marry Guan, MD  [facility] 03/13/2019, 7:46 AM

## 2019-03-13 NOTE — Care Management Important Message (Signed)
Important Message  Patient Details  Name: Betty Jackson MRN: [id number] Date of Birth: 06-28-1934   Medicare Important Message Given:  Yes     [name] [name] 03/13/2019, 1:39 PM

## 2019-03-14 ENCOUNTER — Other Ambulatory Visit: Payer: Self-pay | Admitting: Neurological Surgery

## 2019-03-14 DIAGNOSIS — S3210XS Unspecified fracture of sacrum, sequela: Secondary | ICD-10-CM

## 2019-03-14 DIAGNOSIS — K5730 Diverticulosis of large intestine without perforation or abscess without bleeding: Secondary | ICD-10-CM | POA: Diagnosis not present

## 2019-03-14 DIAGNOSIS — M170 Bilateral primary osteoarthritis of knee: Secondary | ICD-10-CM | POA: Diagnosis not present

## 2019-03-14 DIAGNOSIS — I130 Hypertensive heart and chronic kidney disease with heart failure and stage 1 through stage 4 chronic kidney disease, or unspecified chronic kidney disease: Secondary | ICD-10-CM | POA: Diagnosis not present

## 2019-03-14 DIAGNOSIS — I2510 Atherosclerotic heart disease of native coronary artery without angina pectoris: Secondary | ICD-10-CM | POA: Diagnosis not present

## 2019-03-14 DIAGNOSIS — K5732 Diverticulitis of large intestine without perforation or abscess without bleeding: Secondary | ICD-10-CM | POA: Diagnosis not present

## 2019-03-15 ENCOUNTER — Telehealth: Payer: Self-pay | Admitting: Family Medicine

## 2019-03-15 ENCOUNTER — Telehealth: Payer: Self-pay

## 2019-03-15 DIAGNOSIS — M170 Bilateral primary osteoarthritis of knee: Secondary | ICD-10-CM | POA: Diagnosis not present

## 2019-03-15 DIAGNOSIS — I2510 Atherosclerotic heart disease of native coronary artery without angina pectoris: Secondary | ICD-10-CM | POA: Diagnosis not present

## 2019-03-15 DIAGNOSIS — K5730 Diverticulosis of large intestine without perforation or abscess without bleeding: Secondary | ICD-10-CM | POA: Diagnosis not present

## 2019-03-15 DIAGNOSIS — I130 Hypertensive heart and chronic kidney disease with heart failure and stage 1 through stage 4 chronic kidney disease, or unspecified chronic kidney disease: Secondary | ICD-10-CM | POA: Diagnosis not present

## 2019-03-15 DIAGNOSIS — K5732 Diverticulitis of large intestine without perforation or abscess without bleeding: Secondary | ICD-10-CM | POA: Diagnosis not present

## 2019-03-15 NOTE — Telephone Encounter (Signed)
OK to proceed with requested service.

## 2019-03-15 NOTE — Telephone Encounter (Signed)
Please advise 

## 2019-03-15 NOTE — Telephone Encounter (Signed)
Deidra with [name] calling in to request vo for Speech therapy for pt.   Frequency: 1 week 1 2 week 1 with likely re certification    CB: [phone]

## 2019-03-15 NOTE — Telephone Encounter (Signed)
Transition Care Management Follow-up Telephone Call  Date of discharge and from where: 03/13/2019 from [facility]  How have you been since you were released from the hospital? She's currently staying with her daughter. She's been having some shortness of breath and anxiety. Continuing to have a chronic cough and is occasionally productive with white sputum. Has been afebrile at home.  Also complained of dizziness this morning. She's not been sleeping very well at night due to getting up to bathroom multiple times.   Any questions or concerns? Daughter reported that she was receiving albuterol in hospital and she's given her a neb treatment of albuterol at home' although it was not on discharge orders. She feels this helps her breathe a little better and not be so anxious.  Items Reviewed:  Did the pt receive and understand the discharge instructions provided? Yes   Medications obtained and verified? Yes   Any new allergies since your discharge? No   Dietary orders reviewed? Yes  Do you have support at home? Yes ; currently staying with her daughter.  Other (ie: DME, Home Health, etc) yes, PT and ST have started. Nursing has been ordered as well. Has BSC there and nebulizer.  Functional Questionnaire: (I = Independent and D = Dependent) ADL's: Daughter reports she is ambulating with her walker a little bit.   Bathing/Dressing- requires help from daughter    Meal Prep- daughter is doing this  Eating- independent and is eating well  Maintaining continence- yes and daughter helps if needed  Transferring/Ambulation- daughter assists if needed  Managing Meds- daughter is doing for her   Follow up appointments reviewed:    PCP Hospital f/u appt confirmed? Yes  Scheduled for telephone visit with Dr. Elease Hashimoto 03/27/19 1:30pm.  [facility] f/u appt confirmed? Yes  Scheduled to see cardiology 03/29/19 2:30pm.  Are transportation arrangements needed? No   If their  condition worsens, is the pt aware to call  their PCP or go to the ED? Yes  Was the patient provided with contact information for the PCP's office or ED? Yes  Was the pt encouraged to call back with questions or concerns? Yes

## 2019-03-15 NOTE — Telephone Encounter (Signed)
Message Routed to PCP CMA 

## 2019-03-15 NOTE — Telephone Encounter (Signed)
Called Betty Jackson and gave her the message from Dr. Elease Hashimoto. Nurse verbalized an understanding.

## 2019-03-16 ENCOUNTER — Telehealth: Payer: Self-pay | Admitting: *Deleted

## 2019-03-16 ENCOUNTER — Other Ambulatory Visit: Payer: Self-pay | Admitting: Cardiovascular Disease

## 2019-03-16 NOTE — Telephone Encounter (Signed)
Copied from [place] [phone]. Topic: General - Other >> Mar 16, 2019 11:48 AM Antonieta Iba C wrote: Reason for CRM: Reason for CRM: Mariann Laster with [name] is calling in to follow up on order that was faxed over for  02/21/19, they still have not received it back signed by PCP, this is out of medicare compliance, they would like to receive back as soon as possible.  CBPO:[id number] Fax: [phone]

## 2019-03-17 ENCOUNTER — Encounter: Payer: Self-pay | Admitting: Family Medicine

## 2019-03-20 DIAGNOSIS — M170 Bilateral primary osteoarthritis of knee: Secondary | ICD-10-CM | POA: Diagnosis not present

## 2019-03-20 DIAGNOSIS — I2510 Atherosclerotic heart disease of native coronary artery without angina pectoris: Secondary | ICD-10-CM | POA: Diagnosis not present

## 2019-03-20 DIAGNOSIS — I130 Hypertensive heart and chronic kidney disease with heart failure and stage 1 through stage 4 chronic kidney disease, or unspecified chronic kidney disease: Secondary | ICD-10-CM | POA: Diagnosis not present

## 2019-03-20 DIAGNOSIS — K5732 Diverticulitis of large intestine without perforation or abscess without bleeding: Secondary | ICD-10-CM | POA: Diagnosis not present

## 2019-03-20 DIAGNOSIS — K5730 Diverticulosis of large intestine without perforation or abscess without bleeding: Secondary | ICD-10-CM | POA: Diagnosis not present

## 2019-03-21 ENCOUNTER — Telehealth: Payer: Self-pay | Admitting: Family Medicine

## 2019-03-21 DIAGNOSIS — I130 Hypertensive heart and chronic kidney disease with heart failure and stage 1 through stage 4 chronic kidney disease, or unspecified chronic kidney disease: Secondary | ICD-10-CM | POA: Diagnosis not present

## 2019-03-21 DIAGNOSIS — K5730 Diverticulosis of large intestine without perforation or abscess without bleeding: Secondary | ICD-10-CM | POA: Diagnosis not present

## 2019-03-21 DIAGNOSIS — I2510 Atherosclerotic heart disease of native coronary artery without angina pectoris: Secondary | ICD-10-CM | POA: Diagnosis not present

## 2019-03-21 DIAGNOSIS — M170 Bilateral primary osteoarthritis of knee: Secondary | ICD-10-CM | POA: Diagnosis not present

## 2019-03-21 DIAGNOSIS — K5732 Diverticulitis of large intestine without perforation or abscess without bleeding: Secondary | ICD-10-CM | POA: Diagnosis not present

## 2019-03-21 NOTE — Telephone Encounter (Signed)
Please advise 

## 2019-03-21 NOTE — Telephone Encounter (Signed)
OK 

## 2019-03-21 NOTE — Telephone Encounter (Signed)
Betty Jackson Betty Jackson) Verbal order for P/Th and also [facility] ([place])  1 wk 2 times and 2 a wk for week wk 3 & 4 . Call back [phone]

## 2019-03-21 NOTE — Telephone Encounter (Signed)
Called patient and LMOVM to return call  [place] for [facility] to Discuss results / PCP / recommendations / Schedule patient  Gave the verbal OK for orders per Dr. Elease Hashimoto.  CRM Created.

## 2019-03-21 NOTE — Telephone Encounter (Signed)
We received multiple paperwork from [facility] and I have faxed all of them back this morning.

## 2019-03-27 ENCOUNTER — Other Ambulatory Visit: Payer: Self-pay

## 2019-03-27 ENCOUNTER — Ambulatory Visit (INDEPENDENT_AMBULATORY_CARE_PROVIDER_SITE_OTHER): Payer: Medicare Other | Admitting: Family Medicine

## 2019-03-27 ENCOUNTER — Ambulatory Visit
Admission: RE | Admit: 2019-03-27 | Discharge: 2019-03-27 | Disposition: A | Discharge status: Home / Self Care | Payer: Medicare Other | Source: Ambulatory Visit | Attending: Neurological Surgery | Admitting: Neurological Surgery

## 2019-03-27 DIAGNOSIS — K5730 Diverticulosis of large intestine without perforation or abscess without bleeding: Secondary | ICD-10-CM | POA: Diagnosis not present

## 2019-03-27 DIAGNOSIS — N183 Chronic kidney disease, stage 3 unspecified: Secondary | ICD-10-CM

## 2019-03-27 DIAGNOSIS — S3210XA Unspecified fracture of sacrum, initial encounter for closed fracture: Secondary | ICD-10-CM | POA: Diagnosis not present

## 2019-03-27 DIAGNOSIS — E8809 Other disorders of plasma-protein metabolism, not elsewhere classified: Secondary | ICD-10-CM | POA: Diagnosis not present

## 2019-03-27 DIAGNOSIS — I10 Essential (primary) hypertension: Secondary | ICD-10-CM | POA: Diagnosis not present

## 2019-03-27 DIAGNOSIS — S3210XS Unspecified fracture of sacrum, sequela: Secondary | ICD-10-CM

## 2019-03-27 DIAGNOSIS — R531 Weakness: Secondary | ICD-10-CM

## 2019-03-27 DIAGNOSIS — I480 Paroxysmal atrial fibrillation: Secondary | ICD-10-CM

## 2019-03-27 NOTE — Progress Notes (Signed)
This visit type was conducted due to national recommendations for restrictions regarding the COVID-19 pandemic in an effort to limit this patient's exposure and mitigate transmission in our community.   Virtual Visit via Telephone Note  I connected with Betty Jackson on 03/27/19 at  1:30 PM EST by telephone and verified that I am speaking with the correct person using two identifiers.   I discussed the limitations, risks, security and privacy concerns of performing an evaluation and management service by telephone and the availability of in person appointments. I also discussed with the patient that there may be a patient responsible charge related to this service. The patient expressed understanding and agreed to proceed.  Location patient: home Location provider: work or home office Participants present for the call: patient, provider Patient did not have a visit in the prior 7 days to address this/these issue(s).   History of Present Illness: Betty Jackson had recent hospitalization from December 7-14 for pericarditis and presumed sepsis.  Her chronic problems include history of temporal arteritis, atrial fibrillation, hypertension, coronary artery disease, history of CVA, chronic kidney disease, chronic low back pain secondary to degenerative changes.  She had recent admission for diverticulitis.  She had presented to the ED on the seventh with some dysphagia for several days with nausea vomiting diarrhea and had hypotension and tachycardia along with noted pericardial effusion.  She was followed by CVTS.  She underwent pericardial window on December 9 and did well following that procedure.  Her blood cultures were negative.  Was presumed that she had probable viral pericarditis.  She was treated with broad-spectrum antibiotics.  She feels that she is recovering back near her baseline at this time.  She does have some generalized weakness.  She has had physical therapy in the past and was recommend that  she have home health physical therapy again which apparently was not ordered.  She remains on Eliquis and was also started on amiodarone for rapid A. fib.  She apparently is taking the amiodarone.  She was discharged on Lasix 40 mg twice daily.  Looks like from discharge summary they took her off losartan but it was somewhat difficult to confirm with pt and daughter.Marland Kitchen  She was placed back on diltiazem 60 mg 3 times daily.  Recent albumin 2.2.  Past Medical History:  Diagnosis Date  . Abdominal pain, epigastric 06/22/2013  . Abdominal pain, other specified site 12/24/2011  . Abnormal urinalysis 05/20/2017  . Acute respiratory failure with hypoxia ([place]) 05/20/2017  . Anemia, unspecified 10/28/2012  . Anxiety state, unspecified 10/28/2012  . Bruises easily    d/t being on prednisone and plavix  . CAD (coronary artery disease)    a. Stent RCA in [facility];  b. Cath approx 2009 - nonobs per pt report.  . Cataract    immature on the left eye  . Chronic insomnia 02/07/2013  . Chronic lower back pain    scoliosis  . CKD (chronic kidney disease) stage 3, GFR 30-59 ml/min   . Complication of anesthesia    pt has a very high tolerance to meds  . Coronary disease 05/20/2017  . CVA (cerebral infarction) 10/29/2012  . DDD (degenerative disc disease)   . Depression   . Diverticulitis    hx of  . Diverticulosis   . Elevated transaminase level 02/07/2013  . Enteritis   . GERD (gastroesophageal reflux disease) 09/01/2010  . Giant cell arteritis ([place])   . Hemorrhoids   . Herniated nucleus pulposus, L5-S1, right  11/04/2015  . History of scabies   . HTN (hypertension) 05/20/2017  . Hyperlipidemia    takes Lipitor daily  . Hypertension    takes Amlodipine,Losartan,Metoprolol,and HCTZ daily  . Incontinence of urine   . Influenza A 05/20/2017  . Insomnia    takes Restoril nightly  . Lumbar stenosis 04/25/2013  . Major depressive disorder, recurrent episode, moderate ([place]) 07/16/2013  . Migraines     "back in my 20's; none since" (10/28/2012)  . Osteoporosis   . PAF (paroxysmal atrial fibrillation) ([place]) 2011   a. lone epidode in 2011 according to notes.  . Rheumatoid arthritis ([place])   . Scoliosis   . Sepsis ([place]) 05/20/2017  . Sinus bradycardia    a. on chronic bb  . Temporal arteritis ([place]) 2011   a. followed @ [facility]; potential flareup 10/28/2012/notes 10/28/2012  . Vocal cord dysfunction    "they don't operate properly" (10/28/2012)   Past Surgical History:  Procedure Laterality Date  . ABDOMINAL HYSTERECTOMY  ~ 1984   vaginally  . BACK SURGERY  7-34yrs ago   X Stop  . BLADDER SUSPENSION  2001  . BREAST BIOPSY Right   . CATARACT EXTRACTION W/ INTRAOCULAR LENS IMPLANT Right ~ 08/2012  . CHEST TUBE INSERTION Left 03/08/2019   Procedure: Chest Tube Insertion;  Surgeon: Ivin Poot, MD;  Location: [place];  Service: Thoracic;  Laterality: Left;  . COLONOSCOPY  01/26/2012   Procedure: COLONOSCOPY;  Surgeon: Ladene Artist, MD,FACG;  Location: [facility] ENDOSCOPY;  Service: Endoscopy;  Laterality: N/A;  note the EGD is possible  . CORONARY ANGIOPLASTY WITH STENT PLACEMENT  2006   X 1 stent  . EPIDURAL BLOCK INJECTION    . ESOPHAGOGASTRODUODENOSCOPY  01/26/2012   Procedure: ESOPHAGOGASTRODUODENOSCOPY (EGD);  Surgeon: Ladene Artist, MD,FACG;  Location[facility] ENDOSCOPY;  Service: Endoscopy;  Laterality: N/A;  . HEMIARTHROPLASTY HIP Right 2012  . LAPAROSCOPIC CHOLECYSTECTOMY  2001  . LUMBAR FUSION  03/2013  . LUMBAR LAMINECTOMY/DECOMPRESSION MICRODISCECTOMY Right 11/04/2015   Procedure: Right Lumbar Five-Sacral One Microdiskectomy;  Surgeon: Kristeen Miss, MD;  Location: [place] NEURO ORS;  Service: Neurosurgery;  Laterality: Right;  Right L5-S1 Microdiskectomy  . moles removed that required stiches     one on leg and one on face  . SUBXYPHOID PERICARDIAL WINDOW N/A 03/08/2019   Procedure: SUBXYPHOID PERICARDIAL WINDOW;  Surgeon: Ivin Poot, MD;  Location: [place];  Service: Thoracic;  Laterality: N/A;   . TEE WITHOUT CARDIOVERSION N/A 03/08/2019   Procedure: TRANSESOPHAGEAL ECHOCARDIOGRAM (TEE);  Surgeon: Prescott Gum, Collier Salina, MD;  Location: [place];  Service: Thoracic;  Laterality: N/A;  . TEMPORAL ARTERY BIOPSY / LIGATION Bilateral 2011  . TONSILLECTOMY AND ADENOIDECTOMY     at age 77  . TUBAL LIGATION  ~ 1982  . X-STOP IMPLANTATION  ~ 2010   "lower back" (10/28/2012)    reports that she has never smoked. She has never used smokeless tobacco. She reports current alcohol use. She reports that she does not use drugs. family history includes Brain cancer (age of onset: 32) in her mother; Breast cancer (age of onset: 38) in her daughter; Heart attack in her father; Heart disease in an other family member; Hypertension in an other family member. Allergies  Allergen Reactions  . Dextromethorphan Rash      Observations/Objective: Patient sounds cheerful and well on the phone. I do not appreciate any SOB. Speech and thought processing are grossly intact. Patient reported vitals:  Assessment and Plan:  #1 recent pericarditis-probably  viral status post pericardial window and improved  -Continue close follow-up with cardiology  #2 history of paroxysmal atrial fibrillation.  Recent initiation of amiodarone and also back on diltiazem  -Again encouraged close follow-up with cardiology  #3 generalized weakness  -Set up home PT  #4 hypoalbuminemia  -Discussed dietary factors.  Consider protein supplement such as boost or Ensure.  We also discussed several good dietary sources of protein    -We will set up home health PT and also try to get some additional help with an aid  Follow Up Instructions:  -As above   [id number] [id number] [id number] 11-20 [id number] 21-30 I did not refer this patient for an OV in the next 24 hours for this/these issue(s).  I discussed the assessment and treatment plan with the patient. The patient was provided an opportunity to ask questions and all were answered. The patient  agreed with the plan and demonstrated an understanding of the instructions.   The patient was advised to call back or seek an in-person evaluation if the symptoms worsen or if the condition fails to improve as anticipated.  I provided 25 minutes of non-face-to-face time during this encounter.   Carolann Littler, MD

## 2019-03-28 ENCOUNTER — Other Ambulatory Visit: Payer: Self-pay | Admitting: Cardiothoracic Surgery

## 2019-03-28 DIAGNOSIS — I0981 Rheumatic heart failure: Secondary | ICD-10-CM | POA: Diagnosis not present

## 2019-03-28 DIAGNOSIS — I5032 Chronic diastolic (congestive) heart failure: Secondary | ICD-10-CM | POA: Diagnosis not present

## 2019-03-28 DIAGNOSIS — N183 Chronic kidney disease, stage 3 unspecified: Secondary | ICD-10-CM | POA: Diagnosis not present

## 2019-03-28 DIAGNOSIS — D631 Anemia in chronic kidney disease: Secondary | ICD-10-CM | POA: Diagnosis not present

## 2019-03-28 DIAGNOSIS — I319 Disease of pericardium, unspecified: Secondary | ICD-10-CM

## 2019-03-28 DIAGNOSIS — I130 Hypertensive heart and chronic kidney disease with heart failure and stage 1 through stage 4 chronic kidney disease, or unspecified chronic kidney disease: Secondary | ICD-10-CM | POA: Diagnosis not present

## 2019-03-28 DIAGNOSIS — Z9889 Other specified postprocedural states: Secondary | ICD-10-CM

## 2019-03-29 ENCOUNTER — Encounter: Payer: Self-pay | Admitting: Cardiothoracic Surgery

## 2019-03-29 ENCOUNTER — Ambulatory Visit (INDEPENDENT_AMBULATORY_CARE_PROVIDER_SITE_OTHER): Payer: Self-pay | Admitting: Cardiothoracic Surgery

## 2019-03-29 ENCOUNTER — Other Ambulatory Visit: Payer: Self-pay

## 2019-03-29 ENCOUNTER — Ambulatory Visit
Admission: RE | Admit: 2019-03-29 | Discharge: 2019-03-29 | Disposition: A | Discharge status: Home / Self Care | Payer: Medicare Other | Source: Ambulatory Visit | Attending: Cardiothoracic Surgery | Admitting: Cardiothoracic Surgery

## 2019-03-29 VITALS — BP 137/76 | HR 59 | Temp 97.7°F | Resp 24 | Ht 64.5 in | Wt 155.0 lb

## 2019-03-29 DIAGNOSIS — Z09 Encounter for follow-up examination after completed treatment for conditions other than malignant neoplasm: Secondary | ICD-10-CM

## 2019-03-29 DIAGNOSIS — I313 Pericardial effusion (noninflammatory): Secondary | ICD-10-CM | POA: Diagnosis not present

## 2019-03-29 DIAGNOSIS — I3139 Other pericardial effusion (noninflammatory): Secondary | ICD-10-CM

## 2019-03-29 DIAGNOSIS — Z9889 Other specified postprocedural states: Secondary | ICD-10-CM

## 2019-03-29 NOTE — Progress Notes (Signed)
PCP is Burchette, Alinda Sierras, MD Referring Provider is Hollice Gong, Mir Mohamme*  Chief Complaint  Patient presents with  . Routine Post Op    f/u from surgery with CXR s/p Subxiphoid pericardial window and drainage of pericardial effusion 03/08/19    HPI: Scheduled postop visit after subxiphoid pericardial window for large pericardial effusion related to viral pericarditis-non-Covid. Chest x-ray today is clear Surgical incision healing well Patient is in incisional pain has significantly improved Overall strength and exercise tolerance also improved. Tissue and fluid specimens for culture negative and pathology showed mild inflammation   Past Medical History:  Diagnosis Date  . Abdominal pain, epigastric 06/22/2013  . Abdominal pain, other specified site 12/24/2011  . Abnormal urinalysis 05/20/2017  . Acute respiratory failure with hypoxia ([place]) 05/20/2017  . Anemia, unspecified 10/28/2012  . Anxiety state, unspecified 10/28/2012  . Bruises easily    d/t being on prednisone and plavix  . CAD (coronary artery disease)    a. Stent RCA in [facility];  b. Cath approx 2009 - nonobs per pt report.  . Cataract    immature on the left eye  . Chronic insomnia 02/07/2013  . Chronic lower back pain    scoliosis  . CKD (chronic kidney disease) stage 3, GFR 30-59 ml/min   . Complication of anesthesia    pt has a very high tolerance to meds  . Coronary disease 05/20/2017  . CVA (cerebral infarction) 10/29/2012  . DDD (degenerative disc disease)   . Depression   . Diverticulitis    hx of  . Diverticulosis   . Elevated transaminase level 02/07/2013  . Enteritis   . GERD (gastroesophageal reflux disease) 09/01/2010  . Giant cell arteritis ([place])   . Hemorrhoids   . Herniated nucleus pulposus, L5-S1, right 11/04/2015  . History of scabies   . HTN (hypertension) 05/20/2017  . Hyperlipidemia    takes Lipitor daily  . Hypertension    takes Amlodipine,Losartan,Metoprolol,and HCTZ daily  .  Incontinence of urine   . Influenza A 05/20/2017  . Insomnia    takes Restoril nightly  . Lumbar stenosis 04/25/2013  . Major depressive disorder, recurrent episode, moderate ([place]) 07/16/2013  . Migraines    "back in my 20's; none since" (10/28/2012)  . Osteoporosis   . PAF (paroxysmal atrial fibrillation) ([place]) 2011   a. lone epidode in 2011 according to notes.  . Rheumatoid arthritis ([place])   . Scoliosis   . Sepsis ([place]) 05/20/2017  . Sinus bradycardia    a. on chronic bb  . Temporal arteritis ([place]) 2011   a. followed @ [facility]; potential flareup 10/28/2012/notes 10/28/2012  . Vocal cord dysfunction    "they don't operate properly" (10/28/2012)    Past Surgical History:  Procedure Laterality Date  . ABDOMINAL HYSTERECTOMY  ~ 1984   vaginally  . BACK SURGERY  7-2yrs ago   X Stop  . BLADDER SUSPENSION  2001  . BREAST BIOPSY Right   . CATARACT EXTRACTION W/ INTRAOCULAR LENS IMPLANT Right ~ 08/2012  . CHEST TUBE INSERTION Left 03/08/2019   Procedure: Chest Tube Insertion;  Surgeon: Ivin Poot, MD;  Location: [place];  Service: Thoracic;  Laterality: Left;  . COLONOSCOPY  01/26/2012   Procedure: COLONOSCOPY;  Surgeon: Ladene Artist, MD,FACG;  Location[facility] ENDOSCOPY;  Service: Endoscopy;  Laterality: N/A;  note the EGD is possible  . CORONARY ANGIOPLASTY WITH STENT PLACEMENT  2006   X 1 stent  . EPIDURAL BLOCK INJECTION    .  ESOPHAGOGASTRODUODENOSCOPY  01/26/2012   Procedure: ESOPHAGOGASTRODUODENOSCOPY (EGD);  Surgeon: Ladene Artist, MD,FACG;  Location: [facility] ENDOSCOPY;  Service: Endoscopy;  Laterality: N/A;  . HEMIARTHROPLASTY HIP Right 2012  . LAPAROSCOPIC CHOLECYSTECTOMY  2001  . LUMBAR FUSION  03/2013  . LUMBAR LAMINECTOMY/DECOMPRESSION MICRODISCECTOMY Right 11/04/2015   Procedure: Right Lumbar Five-Sacral One Microdiskectomy;  Surgeon: Kristeen Miss, MD;  Location: [place] NEURO ORS;  Service: Neurosurgery;  Laterality: Right;  Right L5-S1 Microdiskectomy  . moles removed that required  stiches     one on leg and one on face  . SUBXYPHOID PERICARDIAL WINDOW N/A 03/08/2019   Procedure: SUBXYPHOID PERICARDIAL WINDOW;  Surgeon: Ivin Poot, MD;  Location: [place];  Service: Thoracic;  Laterality: N/A;  . TEE WITHOUT CARDIOVERSION N/A 03/08/2019   Procedure: TRANSESOPHAGEAL ECHOCARDIOGRAM (TEE);  Surgeon: Prescott Gum, Collier Salina, MD;  Location: [place];  Service: Thoracic;  Laterality: N/A;  . TEMPORAL ARTERY BIOPSY / LIGATION Bilateral 2011  . TONSILLECTOMY AND ADENOIDECTOMY     at age 59  . TUBAL LIGATION  ~ 1982  . X-STOP IMPLANTATION  ~ 2010   "lower back" (10/28/2012)    Family History  Problem Relation Age of Onset  . Brain cancer Mother 59  . Heart attack Father   . Breast cancer Daughter 19  . Heart disease Other   . Hypertension Other   . Arthritis Neg Hx   . Colon cancer Neg Hx   . Osteoporosis Neg Hx     Social History Social History   Tobacco Use  . Smoking status: Never Smoker  . Smokeless tobacco: Never Used  Substance Use Topics  . Alcohol use: Yes    Comment: socially  . Drug use: No    Current Outpatient Medications  Medication Sig Dispense Refill  . amiodarone (PACERONE) 200 MG tablet Take 1 tablet (200 mg total) by mouth daily. 90 tablet 0  . cholecalciferol (VITAMIN D) 1000 UNITS tablet Take 1,000 Units by mouth daily.     Marland Kitchen dicyclomine (BENTYL) 20 MG tablet Take 1 tablet (20 mg total) by mouth 2 (two) times daily as needed for spasms. 20 tablet 0  . diltiazem (CARDIZEM) 60 MG tablet Take 1 tablet (60 mg total) by mouth 3 (three) times daily. 90 tablet 6  . ELIQUIS 5 MG TABS tablet TAKE 1 TABLET BY MOUTH TWICE A DAY (Patient taking differently: Take 5 mg by mouth 2 (two) times daily. ) 60 tablet 5  . escitalopram (LEXAPRO) 10 MG tablet TAKE 1 TABLET BY MOUTH EVERY DAY (Patient taking differently: Take 10 mg by mouth daily. ) 90 tablet 3  . furosemide (LASIX) 20 MG tablet Take 2 tablets (40 mg total) by mouth 2 (two) times daily. 120 tablet 0  .  HYDROcodone-acetaminophen (NORCO/VICODIN) 5-325 MG tablet Take one tablet by mouth every 8 hours as needed for pain. May refill in two months. (Patient taking differently: Take 1 tablet by mouth every 6 (six) hours as needed for moderate pain. ) 90 tablet 0  . metoprolol tartrate (LOPRESSOR) 100 MG tablet Take 1 tablet (100 mg total) by mouth 2 (two) times daily. 60 tablet 2  . ondansetron (ZOFRAN ODT) 4 MG disintegrating tablet Take 1 tablet (4 mg total) by mouth every 8 (eight) hours as needed for nausea or vomiting. 20 tablet 0  . pantoprazole (PROTONIX) 40 MG tablet TAKE 1 TABLET BY MOUTH EVERY DAY 90 tablet 1  . potassium chloride SA (KLOR-CON) 20 MEQ tablet Take 1 tablet (20 mEq  total) by mouth daily. 90 tablet 1  . predniSONE (DELTASONE) 10 MG tablet 4 tablets for 3 days, 3 tablets for 3 days, 2 tablets for 3 days, 1 tablet continue (Patient taking differently: Take 10 mg by mouth daily. ) 90 tablet 1  . rosuvastatin (CRESTOR) 10 MG tablet Take 10 mg by mouth at bedtime.    . vitamin C (ASCORBIC ACID) 500 MG tablet Take 1,000 mg by mouth daily.     . vitamin E (VITAMIN E) 400 UNIT capsule Take 400 Units by mouth daily.     No current facility-administered medications for this visit.    Allergies  Allergen Reactions  . Dextromethorphan Rash    Review of Systems  Patient has paroxysmal atrial fibrillation which was worse after surgery.  She is now in sinus rhythm with heart rate of 60.  BP 137/76 (BP Location: Right Arm, Patient Position: Sitting, Cuff Size: Normal)   Pulse (!) 59   Temp 97.7 F (36.5 C) (Skin)   Resp (!) 24   Ht 5' 4.5" (1.638 m)   Wt 155 lb (70.3 kg)   SpO2 94% Comment: RA  BMI 26.19 kg/m  Physical Exam      Exam Surgical incision is well-healed.   General- alert and comfortable    Neck- no JVD, no cervical adenopathy palpable, no carotid bruit   Lungs- clear without rales, wheezes   Cor- regular rate and rhythm, no murmur , gallop   Abdomen- soft,  non-tender   Extremities - warm, non-tender, minimal edema   Neuro- oriented, appropriate, no focal weakness   Diagnostic Tests: Chest x-ray personally viewed showing clear lung fields, cardiac silhouette stable, no significant pleural effusions  Impression: Doing well after subxiphoid pericardial drainage and window for large effusion related to viral pericarditis Her rapid atrial fibrillation has resolved and she is now in  slow sinus rhythm.  She will stop the amiodarone and continue her long-term doses of Cardizem  and metoprolol  Plan: Patient may resume normal activities and driving.  No further surgical follow-up needed.   Len Childs, MD [facility] [phone]

## 2019-03-30 ENCOUNTER — Telehealth: Payer: Self-pay | Admitting: Family Medicine

## 2019-03-30 NOTE — Telephone Encounter (Signed)
This encounter was created in error - please disregard.

## 2019-03-30 NOTE — Telephone Encounter (Signed)
Copied from [place] [id number]. Topic: General - Other >> Mar 30, 2019 11:01 AM Keene Breath wrote: Reason for CRM: Patient is requesting a delay in speech therapy until next week.  Please confirm and call asap to confirm orders at [phone]

## 2019-03-30 NOTE — Telephone Encounter (Signed)
Okay for verbal orders? Please advise 

## 2019-04-03 DIAGNOSIS — N183 Chronic kidney disease, stage 3 unspecified: Secondary | ICD-10-CM | POA: Diagnosis not present

## 2019-04-03 DIAGNOSIS — I0981 Rheumatic heart failure: Secondary | ICD-10-CM | POA: Diagnosis not present

## 2019-04-03 DIAGNOSIS — D631 Anemia in chronic kidney disease: Secondary | ICD-10-CM | POA: Diagnosis not present

## 2019-04-03 DIAGNOSIS — I130 Hypertensive heart and chronic kidney disease with heart failure and stage 1 through stage 4 chronic kidney disease, or unspecified chronic kidney disease: Secondary | ICD-10-CM | POA: Diagnosis not present

## 2019-04-03 DIAGNOSIS — I5032 Chronic diastolic (congestive) heart failure: Secondary | ICD-10-CM | POA: Diagnosis not present

## 2019-04-03 NOTE — Telephone Encounter (Signed)
OK to approve services requested.

## 2019-04-04 DIAGNOSIS — I0981 Rheumatic heart failure: Secondary | ICD-10-CM | POA: Diagnosis not present

## 2019-04-04 DIAGNOSIS — D631 Anemia in chronic kidney disease: Secondary | ICD-10-CM | POA: Diagnosis not present

## 2019-04-04 DIAGNOSIS — I130 Hypertensive heart and chronic kidney disease with heart failure and stage 1 through stage 4 chronic kidney disease, or unspecified chronic kidney disease: Secondary | ICD-10-CM | POA: Diagnosis not present

## 2019-04-04 DIAGNOSIS — N183 Chronic kidney disease, stage 3 unspecified: Secondary | ICD-10-CM | POA: Diagnosis not present

## 2019-04-04 DIAGNOSIS — I5032 Chronic diastolic (congestive) heart failure: Secondary | ICD-10-CM | POA: Diagnosis not present

## 2019-04-04 NOTE — Telephone Encounter (Signed)
Called patient and LMOVM to return call  [place] for [facility] to Discuss results / PCP / recommendations / Schedule patient  I called and left a detailed voice message about patient to Deirdre and per Dr. Elease Hashimoto: OK to approve services requested.  CRM Created.

## 2019-04-05 DIAGNOSIS — S3210XS Unspecified fracture of sacrum, sequela: Secondary | ICD-10-CM | POA: Diagnosis not present

## 2019-04-05 DIAGNOSIS — I130 Hypertensive heart and chronic kidney disease with heart failure and stage 1 through stage 4 chronic kidney disease, or unspecified chronic kidney disease: Secondary | ICD-10-CM | POA: Diagnosis not present

## 2019-04-05 DIAGNOSIS — D631 Anemia in chronic kidney disease: Secondary | ICD-10-CM | POA: Diagnosis not present

## 2019-04-05 DIAGNOSIS — I5032 Chronic diastolic (congestive) heart failure: Secondary | ICD-10-CM | POA: Diagnosis not present

## 2019-04-05 DIAGNOSIS — I0981 Rheumatic heart failure: Secondary | ICD-10-CM | POA: Diagnosis not present

## 2019-04-05 DIAGNOSIS — N183 Chronic kidney disease, stage 3 unspecified: Secondary | ICD-10-CM | POA: Diagnosis not present

## 2019-04-06 DIAGNOSIS — Z79899 Other long term (current) drug therapy: Secondary | ICD-10-CM | POA: Diagnosis not present

## 2019-04-06 DIAGNOSIS — D631 Anemia in chronic kidney disease: Secondary | ICD-10-CM | POA: Diagnosis not present

## 2019-04-06 DIAGNOSIS — Z5181 Encounter for therapeutic drug level monitoring: Secondary | ICD-10-CM | POA: Diagnosis not present

## 2019-04-06 DIAGNOSIS — Z79891 Long term (current) use of opiate analgesic: Secondary | ICD-10-CM | POA: Diagnosis not present

## 2019-04-06 DIAGNOSIS — Z981 Arthrodesis status: Secondary | ICD-10-CM | POA: Diagnosis not present

## 2019-04-06 DIAGNOSIS — S3210XS Unspecified fracture of sacrum, sequela: Secondary | ICD-10-CM | POA: Diagnosis not present

## 2019-04-06 DIAGNOSIS — I10 Essential (primary) hypertension: Secondary | ICD-10-CM | POA: Diagnosis not present

## 2019-04-06 DIAGNOSIS — I130 Hypertensive heart and chronic kidney disease with heart failure and stage 1 through stage 4 chronic kidney disease, or unspecified chronic kidney disease: Secondary | ICD-10-CM | POA: Diagnosis not present

## 2019-04-06 DIAGNOSIS — I5032 Chronic diastolic (congestive) heart failure: Secondary | ICD-10-CM | POA: Diagnosis not present

## 2019-04-06 DIAGNOSIS — I0981 Rheumatic heart failure: Secondary | ICD-10-CM | POA: Diagnosis not present

## 2019-04-06 DIAGNOSIS — N183 Chronic kidney disease, stage 3 unspecified: Secondary | ICD-10-CM | POA: Diagnosis not present

## 2019-04-06 LAB — FUNGUS CULTURE WITH STAIN

## 2019-04-06 LAB — FUNGUS CULTURE RESULT

## 2019-04-06 LAB — FUNGAL ORGANISM REFLEX

## 2019-04-07 ENCOUNTER — Telehealth: Payer: Self-pay | Admitting: *Deleted

## 2019-04-07 DIAGNOSIS — I130 Hypertensive heart and chronic kidney disease with heart failure and stage 1 through stage 4 chronic kidney disease, or unspecified chronic kidney disease: Secondary | ICD-10-CM | POA: Diagnosis not present

## 2019-04-07 DIAGNOSIS — I5032 Chronic diastolic (congestive) heart failure: Secondary | ICD-10-CM | POA: Diagnosis not present

## 2019-04-07 DIAGNOSIS — N183 Chronic kidney disease, stage 3 unspecified: Secondary | ICD-10-CM | POA: Diagnosis not present

## 2019-04-07 DIAGNOSIS — I0981 Rheumatic heart failure: Secondary | ICD-10-CM | POA: Diagnosis not present

## 2019-04-07 DIAGNOSIS — D631 Anemia in chronic kidney disease: Secondary | ICD-10-CM | POA: Diagnosis not present

## 2019-04-07 NOTE — Telephone Encounter (Signed)
Patient with diagnosis of ATRIAL FIBRILLATION on ELIQUISfor anticoagulation.     Procedure: RIGHT SACROPLASTY Date of procedure: 05/29/2019  CHADS2-VASc score of  5 (HTN, AGE x2, CAD, AGE, female)  Patient has CVA listed on her problem list, however it was felt the patient had a flare of her temporal arteritis and a punctate stroke seen on MRI may be a manifestation of the temporal arteritis.  Per office protocol, patient can hold Eliquis for 3 days prior to procedure  *Will verify recommendation with primary cardiologist as well*

## 2019-04-07 NOTE — Telephone Encounter (Signed)
   Primary Cardiologist:Christopher Angelena Form, MD  Chart reviewed as part of pre-operative protocol coverage. Because of Kaily L Richardson's past medical history and time since last visit, he/she will require a follow-up visit in order to better assess preoperative cardiovascular risk.  Pre-op covering staff: - Patient is scheduled to see Dr. Julianne Handler 05/25/2019. Will add preop evaluation to the Appt notes. - Please contact requesting surgeon's office via preferred method (i.e, phone, fax) to inform them of need for appointment prior to surgery.  Per pharmacy recommendations, patient can hold eliquis 3 days prior to her upcoming surgery.  Abigail Butts, PA-C  04/07/2019, 10:42 AM

## 2019-04-07 NOTE — Telephone Encounter (Signed)
Faxed to [facility] successfully via [employer].

## 2019-04-07 NOTE — Telephone Encounter (Signed)
Agree that it is ok to hold the [place] for her procedure.   Betty Jackson

## 2019-04-07 NOTE — Telephone Encounter (Signed)
   [facility] Pre-operative Risk Assessment    Request for surgical clearance:  1. What type of surgery is being performed? RIGHT SACROPLASTY    2. When is this surgery scheduled? 05/29/19   3. What type of clearance is required (medical clearance vs. Pharmacy clearance to hold med vs. Both)? BOTH  4. Are there any medications that need to be held prior to surgery and how long? ELQIUIS   5. Practice name and name of physician performing surgery? [facility]; DR. Kristeen Miss   6. What is your office phone number [phone]    7.   What is your office fax number [phone]  8.   Anesthesia type (None, local, MAC, general) ? GENERAL    Julaine Hua 04/07/2019, 8:38 AM  _________________________________________________________________   (provider comments below)

## 2019-04-10 DIAGNOSIS — N183 Chronic kidney disease, stage 3 unspecified: Secondary | ICD-10-CM | POA: Diagnosis not present

## 2019-04-10 DIAGNOSIS — I130 Hypertensive heart and chronic kidney disease with heart failure and stage 1 through stage 4 chronic kidney disease, or unspecified chronic kidney disease: Secondary | ICD-10-CM | POA: Diagnosis not present

## 2019-04-10 DIAGNOSIS — D631 Anemia in chronic kidney disease: Secondary | ICD-10-CM | POA: Diagnosis not present

## 2019-04-10 DIAGNOSIS — I0981 Rheumatic heart failure: Secondary | ICD-10-CM | POA: Diagnosis not present

## 2019-04-10 DIAGNOSIS — I5032 Chronic diastolic (congestive) heart failure: Secondary | ICD-10-CM | POA: Diagnosis not present

## 2019-04-11 DIAGNOSIS — I130 Hypertensive heart and chronic kidney disease with heart failure and stage 1 through stage 4 chronic kidney disease, or unspecified chronic kidney disease: Secondary | ICD-10-CM | POA: Diagnosis not present

## 2019-04-11 DIAGNOSIS — I0981 Rheumatic heart failure: Secondary | ICD-10-CM | POA: Diagnosis not present

## 2019-04-11 DIAGNOSIS — N183 Chronic kidney disease, stage 3 unspecified: Secondary | ICD-10-CM | POA: Diagnosis not present

## 2019-04-11 DIAGNOSIS — D631 Anemia in chronic kidney disease: Secondary | ICD-10-CM | POA: Diagnosis not present

## 2019-04-11 DIAGNOSIS — I5032 Chronic diastolic (congestive) heart failure: Secondary | ICD-10-CM | POA: Diagnosis not present

## 2019-04-13 ENCOUNTER — Emergency Department (HOSPITAL_BASED_OUTPATIENT_CLINIC_OR_DEPARTMENT_OTHER)
Admission: EM | Admit: 2019-04-13 | Discharge: 2019-04-13 | Disposition: A | Discharge status: Home / Self Care | Payer: Medicare Other | Source: Home / Self Care | Attending: Emergency Medicine | Admitting: Emergency Medicine

## 2019-04-13 ENCOUNTER — Encounter (HOSPITAL_BASED_OUTPATIENT_CLINIC_OR_DEPARTMENT_OTHER): Payer: Self-pay

## 2019-04-13 ENCOUNTER — Emergency Department (HOSPITAL_BASED_OUTPATIENT_CLINIC_OR_DEPARTMENT_OTHER): Payer: Medicare Other

## 2019-04-13 ENCOUNTER — Other Ambulatory Visit: Payer: Self-pay

## 2019-04-13 DIAGNOSIS — R0602 Shortness of breath: Secondary | ICD-10-CM | POA: Diagnosis not present

## 2019-04-13 DIAGNOSIS — I480 Paroxysmal atrial fibrillation: Secondary | ICD-10-CM | POA: Diagnosis not present

## 2019-04-13 DIAGNOSIS — N1831 Chronic kidney disease, stage 3a: Secondary | ICD-10-CM | POA: Diagnosis not present

## 2019-04-13 DIAGNOSIS — I517 Cardiomegaly: Secondary | ICD-10-CM | POA: Diagnosis not present

## 2019-04-13 DIAGNOSIS — I2510 Atherosclerotic heart disease of native coronary artery without angina pectoris: Secondary | ICD-10-CM | POA: Diagnosis not present

## 2019-04-13 DIAGNOSIS — D631 Anemia in chronic kidney disease: Secondary | ICD-10-CM | POA: Diagnosis not present

## 2019-04-13 DIAGNOSIS — R6520 Severe sepsis without septic shock: Secondary | ICD-10-CM | POA: Diagnosis not present

## 2019-04-13 DIAGNOSIS — E785 Hyperlipidemia, unspecified: Secondary | ICD-10-CM | POA: Diagnosis not present

## 2019-04-13 DIAGNOSIS — M25551 Pain in right hip: Secondary | ICD-10-CM

## 2019-04-13 DIAGNOSIS — G8929 Other chronic pain: Secondary | ICD-10-CM | POA: Diagnosis not present

## 2019-04-13 DIAGNOSIS — A419 Sepsis, unspecified organism: Secondary | ICD-10-CM | POA: Diagnosis not present

## 2019-04-13 DIAGNOSIS — M4807 Spinal stenosis, lumbosacral region: Secondary | ICD-10-CM | POA: Diagnosis not present

## 2019-04-13 DIAGNOSIS — M069 Rheumatoid arthritis, unspecified: Secondary | ICD-10-CM | POA: Diagnosis not present

## 2019-04-13 DIAGNOSIS — Z96641 Presence of right artificial hip joint: Secondary | ICD-10-CM | POA: Insufficient documentation

## 2019-04-13 DIAGNOSIS — Z20822 Contact with and (suspected) exposure to covid-19: Secondary | ICD-10-CM | POA: Diagnosis not present

## 2019-04-13 DIAGNOSIS — Z66 Do not resuscitate: Secondary | ICD-10-CM | POA: Diagnosis not present

## 2019-04-13 DIAGNOSIS — M810 Age-related osteoporosis without current pathological fracture: Secondary | ICD-10-CM | POA: Diagnosis not present

## 2019-04-13 DIAGNOSIS — R945 Abnormal results of liver function studies: Secondary | ICD-10-CM | POA: Diagnosis not present

## 2019-04-13 DIAGNOSIS — I129 Hypertensive chronic kidney disease with stage 1 through stage 4 chronic kidney disease, or unspecified chronic kidney disease: Secondary | ICD-10-CM | POA: Insufficient documentation

## 2019-04-13 DIAGNOSIS — Z981 Arthrodesis status: Secondary | ICD-10-CM | POA: Diagnosis not present

## 2019-04-13 DIAGNOSIS — I5032 Chronic diastolic (congestive) heart failure: Secondary | ICD-10-CM | POA: Diagnosis not present

## 2019-04-13 DIAGNOSIS — E876 Hypokalemia: Secondary | ICD-10-CM | POA: Diagnosis not present

## 2019-04-13 DIAGNOSIS — W19XXXA Unspecified fall, initial encounter: Secondary | ICD-10-CM

## 2019-04-13 DIAGNOSIS — M316 Other giant cell arteritis: Secondary | ICD-10-CM | POA: Diagnosis not present

## 2019-04-13 DIAGNOSIS — Y92008 Other place in unspecified non-institutional (private) residence as the place of occurrence of the external cause: Secondary | ICD-10-CM | POA: Insufficient documentation

## 2019-04-13 DIAGNOSIS — R112 Nausea with vomiting, unspecified: Secondary | ICD-10-CM | POA: Diagnosis not present

## 2019-04-13 DIAGNOSIS — N183 Chronic kidney disease, stage 3 unspecified: Secondary | ICD-10-CM | POA: Insufficient documentation

## 2019-04-13 DIAGNOSIS — G9341 Metabolic encephalopathy: Secondary | ICD-10-CM | POA: Diagnosis not present

## 2019-04-13 DIAGNOSIS — M80052A Age-related osteoporosis with current pathological fracture, left femur, initial encounter for fracture: Secondary | ICD-10-CM | POA: Diagnosis not present

## 2019-04-13 DIAGNOSIS — J189 Pneumonia, unspecified organism: Secondary | ICD-10-CM | POA: Diagnosis not present

## 2019-04-13 DIAGNOSIS — E860 Dehydration: Secondary | ICD-10-CM | POA: Diagnosis not present

## 2019-04-13 DIAGNOSIS — R000 Tachycardia, unspecified: Secondary | ICD-10-CM | POA: Diagnosis not present

## 2019-04-13 DIAGNOSIS — I0981 Rheumatic heart failure: Secondary | ICD-10-CM | POA: Diagnosis not present

## 2019-04-13 DIAGNOSIS — Z8673 Personal history of transient ischemic attack (TIA), and cerebral infarction without residual deficits: Secondary | ICD-10-CM | POA: Diagnosis not present

## 2019-04-13 DIAGNOSIS — Z955 Presence of coronary angioplasty implant and graft: Secondary | ICD-10-CM | POA: Diagnosis not present

## 2019-04-13 DIAGNOSIS — I248 Other forms of acute ischemic heart disease: Secondary | ICD-10-CM | POA: Diagnosis not present

## 2019-04-13 DIAGNOSIS — W01198A Fall on same level from slipping, tripping and stumbling with subsequent striking against other object, initial encounter: Secondary | ICD-10-CM | POA: Insufficient documentation

## 2019-04-13 DIAGNOSIS — S79911A Unspecified injury of right hip, initial encounter: Secondary | ICD-10-CM | POA: Diagnosis not present

## 2019-04-13 DIAGNOSIS — I130 Hypertensive heart and chronic kidney disease with heart failure and stage 1 through stage 4 chronic kidney disease, or unspecified chronic kidney disease: Secondary | ICD-10-CM | POA: Diagnosis not present

## 2019-04-13 MED ORDER — LIDOCAINE 5 % EX OINT: 1.0000 "application " | TOPICAL_OINTMENT | Freq: Three times a day (TID) | CUTANEOUS | 0 refills | Status: DC | PRN

## 2019-04-13 NOTE — ED Triage Notes (Signed)
Pt states her walker "went out from under me"/fell yesterday-pain to right hip and left UE-pt to tx area via her own w/c-stood and steps to stretcher-NAD

## 2019-04-13 NOTE — ED Provider Notes (Signed)
Emergency Department Provider Note   I have reviewed the triage vital signs and the nursing notes.   HISTORY  Chief Complaint Fall   HPI Betty Jackson is a 85 y.o. female with PMH reviewed below presents to the emergency department after mechanical fall at home yesterday.  Patient states she was using her walker and turning to sit down but did not realize that the brakes were not on.  The walker rolled away from her and she fell.  She is complaining of pain primarily in the right hip since the fall but has been ambulatory on the leg.  She does take hydrocodone for chronic back pain which has not helped her discomfort.  She denies numbness, tingling, weakness in the lower extremities.  She did not strike her head or lose consciousness.  She is not hurting in the upper extremities.  She denies chest pain, palpitations, shortness of breath.  She does note history of partial hip replacement on the right in the past and follows with Dr. Wynelle Link.   Past Medical History:  Diagnosis Date  . Abdominal pain, epigastric 06/22/2013  . Abdominal pain, other specified site 12/24/2011  . Abnormal urinalysis 05/20/2017  . Acute respiratory failure with hypoxia ([place]) 05/20/2017  . Anemia, unspecified 10/28/2012  . Anxiety state, unspecified 10/28/2012  . Bruises easily    d/t being on prednisone and plavix  . CAD (coronary artery disease)    a. Stent RCA in [facility];  b. Cath approx 2009 - nonobs per pt report.  . Cataract    immature on the left eye  . Chronic insomnia 02/07/2013  . Chronic lower back pain    scoliosis  . CKD (chronic kidney disease) stage 3, GFR 30-59 ml/min   . Complication of anesthesia    pt has a very high tolerance to meds  . Coronary disease 05/20/2017  . CVA (cerebral infarction) 10/29/2012  . DDD (degenerative disc disease)   . Depression   . Diverticulitis    hx of  . Diverticulosis   . Elevated transaminase level 02/07/2013  . Enteritis   . GERD  (gastroesophageal reflux disease) 09/01/2010  . Giant cell arteritis ([place])   . Hemorrhoids   . Herniated nucleus pulposus, L5-S1, right 11/04/2015  . History of scabies   . HTN (hypertension) 05/20/2017  . Hyperlipidemia    takes Lipitor daily  . Hypertension    takes Amlodipine,Losartan,Metoprolol,and HCTZ daily  . Incontinence of urine   . Influenza A 05/20/2017  . Insomnia    takes Restoril nightly  . Lumbar stenosis 04/25/2013  . Major depressive disorder, recurrent episode, moderate ([place]) 07/16/2013  . Migraines    "back in my 20's; none since" (10/28/2012)  . Osteoporosis   . PAF (paroxysmal atrial fibrillation) ([place]) 2011   a. lone epidode in 2011 according to notes.  . Rheumatoid arthritis ([place])   . Scoliosis   . Sepsis ([place]) 05/20/2017  . Sinus bradycardia    a. on chronic bb  . Temporal arteritis ([place]) 2011   a. followed @ [facility]; potential flareup 10/28/2012/notes 10/28/2012  . Vocal cord dysfunction    "they don't operate properly" (10/28/2012)    Patient Active Problem List   Diagnosis Date Noted  . Pericardial effusion without cardiac tamponade 03/29/2019  . Hypoalbuminemia 03/27/2019  . Cardiac tamponade   . Pericarditis 03/06/2019  . Fever   . Septic shock ([place])   . Atypical chest pain   . Atrial fibrillation with rapid ventricular  response ([place])   . Nausea 01/17/2019  . Acute diverticulitis 01/13/2019  . AKI (acute kidney injury) ([place]) 01/13/2019  . Chronic pain 01/13/2019  . Tachycardia 01/13/2019  . Hypokalemia 01/13/2019  . Chronic low back pain 07/10/2018  . Influenza A 05/20/2017  . Sepsis ([place]) 05/20/2017  . Acute respiratory failure with hypoxia ([place]) 05/20/2017  . CKD (chronic kidney disease) stage 3, GFR 30-59 ml/min 05/20/2017  . PAF (paroxysmal atrial fibrillation) ([place]) 05/20/2017  . HTN (hypertension) 05/20/2017  . Giant cell arteritis ([place]) 05/20/2017  . Coronary disease 05/20/2017  . Abnormal urinalysis 05/20/2017  . Osteoporosis 05/08/2016  .  Herniated nucleus pulposus, L5-S1, right 11/04/2015  . Major depressive disorder, recurrent episode, moderate ([place]) 07/16/2013  . Abdominal pain, epigastric 06/22/2013  . Lumbar stenosis 04/25/2013  . Chronic insomnia 02/07/2013  . Elevated transaminase level 02/07/2013  . CVA (cerebral infarction) 10/29/2012  . Visual changes 10/28/2012  . Depression 10/28/2012  . Anxiety state, unspecified 10/28/2012  . Anemia, unspecified 10/28/2012  . Nonspecific (abnormal) findings on radiological and other examination of gastrointestinal tract 01/25/2012  . Hyponatremia 01/24/2012  . Hematuria 01/24/2012  . Enteritis 12/24/2011  . Abdominal pain, other specified site 12/24/2011  . Leg pain, right 02/18/2011  . Temporal arteritis ([place]) 09/01/2010  . Hypertension 09/01/2010  . Hyperlipidemia 09/01/2010  . History of atrial fibrillation 09/01/2010  . CAD (coronary artery disease) 09/01/2010  . GERD (gastroesophageal reflux disease) 09/01/2010  . Insomnia 09/01/2010    Past Surgical History:  Procedure Laterality Date  . ABDOMINAL HYSTERECTOMY  ~ 1984   vaginally  . BACK SURGERY  7-58yrs ago   X Stop  . BLADDER SUSPENSION  2001  . BREAST BIOPSY Right   . CATARACT EXTRACTION W/ INTRAOCULAR LENS IMPLANT Right ~ 08/2012  . CHEST TUBE INSERTION Left 03/08/2019   Procedure: Chest Tube Insertion;  Surgeon: Ivin Poot, MD;  Location: [facility];  Service: Thoracic;  Laterality: Left;  . COLONOSCOPY  01/26/2012   Procedure: COLONOSCOPY;  Surgeon: Ladene Artist, MD,FACG;  Location: [facility] ENDOSCOPY;  Service: Endoscopy;  Laterality: N/A;  note the EGD is possible  . CORONARY ANGIOPLASTY WITH STENT PLACEMENT  2006   X 1 stent  . EPIDURAL BLOCK INJECTION    . ESOPHAGOGASTRODUODENOSCOPY  01/26/2012   Procedure: ESOPHAGOGASTRODUODENOSCOPY (EGD);  Surgeon: Ladene Artist, MD,FACG;  Location: [facility] ENDOSCOPY;  Service: Endoscopy;  Laterality: N/A;  . HEMIARTHROPLASTY HIP Right 2012  . LAPAROSCOPIC  CHOLECYSTECTOMY  2001  . LUMBAR FUSION  03/2013  . LUMBAR LAMINECTOMY/DECOMPRESSION MICRODISCECTOMY Right 11/04/2015   Procedure: Right Lumbar Five-Sacral One Microdiskectomy;  Surgeon: Kristeen Miss, MD;  Location: [place] NEURO ORS;  Service: Neurosurgery;  Laterality: Right;  Right L5-S1 Microdiskectomy  . moles removed that required stiches     one on leg and one on face  . SUBXYPHOID PERICARDIAL WINDOW N/A 03/08/2019   Procedure: SUBXYPHOID PERICARDIAL WINDOW;  Surgeon: Ivin Poot, MD;  Location: [place];  Service: Thoracic;  Laterality: N/A;  . TEE WITHOUT CARDIOVERSION N/A 03/08/2019   Procedure: TRANSESOPHAGEAL ECHOCARDIOGRAM (TEE);  Surgeon: Prescott Gum, Collier Salina, MD;  Location: [place];  Service: Thoracic;  Laterality: N/A;  . TEMPORAL ARTERY BIOPSY / LIGATION Bilateral 2011  . TONSILLECTOMY AND ADENOIDECTOMY     at age 73  . TUBAL LIGATION  ~ 1982  . X-STOP IMPLANTATION  ~ 2010   "lower back" (10/28/2012)    Allergies Dextromethorphan  Family History  Problem Relation Age of Onset  . Brain cancer  Mother 13  . Heart attack Father   . Breast cancer Daughter 58  . Heart disease Other   . Hypertension Other   . Arthritis Neg Hx   . Colon cancer Neg Hx   . Osteoporosis Neg Hx     Social History Social History   Tobacco Use  . Smoking status: Never Smoker  . Smokeless tobacco: Never Used  Substance Use Topics  . Alcohol use: Yes    Comment: socially  . Drug use: No    Review of Systems  Cardiovascular: Denies chest pain. Respiratory: Denies shortness of breath. Gastrointestinal: No abdominal pain.   Musculoskeletal: Negative for back pain. Positive right hip pain.  Skin: Negative for rash. Neurological: Negative for focal weakness or numbness.  10-point ROS otherwise negative.  ____________________________________________   PHYSICAL EXAM:  VITAL SIGNS: ED Triage Vitals  Enc Vitals Group     BP 04/13/19 1714 137/67     Pulse Rate 04/13/19 1714 73     Resp  04/13/19 1714 18     Temp 04/13/19 1714 98.5 F (36.9 C)     Temp Source 04/13/19 1714 Oral     SpO2 04/13/19 1714 98 %   Constitutional: Alert and oriented. Well appearing and in no acute distress. Eyes: Conjunctivae are normal. Head: Atraumatic. Nose: No congestion/rhinnorhea. Mouth/Throat: Mucous membranes are moist.   Neck: No stridor. No cervical spine tenderness to palpation. Cardiovascular: Normal rate, regular rhythm. Good peripheral circulation. Grossly normal heart sounds.   Respiratory: Normal respiratory effort.  No retractions. Lungs CTAB. Gastrointestinal: No distention.  Musculoskeletal: No lower extremity tenderness nor edema. No gross deformities of extremities. Normal ROM of the right hip and knee.  Neurologic:  Normal speech and language. No gross focal neurologic deficits are appreciated.  Skin:  Skin is warm, dry and intact. No rash noted.  ____________________________________________  RADIOLOGY  Hip plain films reviewed.  ____________________________________________   PROCEDURES  Procedure(s) performed:   Procedures  None  ____________________________________________   INITIAL IMPRESSION / ASSESSMENT AND PLAN / ED COURSE  Pertinent labs & imaging results that were available during my care of the patient were reviewed by me and considered in my medical decision making (see chart for details).   Patient presents to the emergency department for evaluation after mechanical fall yesterday.  Normal range of motion of the hip but some tenderness laterally.  Patient does have history of partial hip replacement on that side.  With this surgical history I plan for plain film of the right hip and reassess.  No evidence of head injury or c-spine tenderness. RLE is neurovascularly intact. No constitutional or pre-syncopal symptoms. Vitals normal here.   Plain film negative. Plan for discharge.  ____________________________________________  FINAL CLINICAL  IMPRESSION(S) / ED DIAGNOSES  Final diagnoses:  Fall, initial encounter  Right hip pain     NEW OUTPATIENT MEDICATIONS STARTED DURING THIS VISIT:  Discharge Medication List as of 04/13/2019  6:05 PM    START taking these medications   Details  lidocaine (XYLOCAINE) 5 % ointment Apply 1 application topically 3 (three) times daily as needed., Starting Thu 04/13/2019, Print        Note:  This document was prepared using Dragon voice recognition software and may include unintentional dictation errors.  Nanda Quinton, MD, [facility] Emergency Medicine    Annah Jasko, [facility], MD 04/16/19 [phone]

## 2019-04-13 NOTE — Discharge Instructions (Signed)

## 2019-04-13 NOTE — ED Notes (Signed)
Patient transported to X-ray 

## 2019-04-14 DIAGNOSIS — I0981 Rheumatic heart failure: Secondary | ICD-10-CM | POA: Diagnosis not present

## 2019-04-14 DIAGNOSIS — D631 Anemia in chronic kidney disease: Secondary | ICD-10-CM | POA: Diagnosis not present

## 2019-04-14 DIAGNOSIS — I130 Hypertensive heart and chronic kidney disease with heart failure and stage 1 through stage 4 chronic kidney disease, or unspecified chronic kidney disease: Secondary | ICD-10-CM | POA: Diagnosis not present

## 2019-04-14 DIAGNOSIS — I5032 Chronic diastolic (congestive) heart failure: Secondary | ICD-10-CM | POA: Diagnosis not present

## 2019-04-14 DIAGNOSIS — N183 Chronic kidney disease, stage 3 unspecified: Secondary | ICD-10-CM | POA: Diagnosis not present

## 2019-04-15 ENCOUNTER — Inpatient Hospital Stay (HOSPITAL_COMMUNITY): Payer: Medicare Other

## 2019-04-15 ENCOUNTER — Other Ambulatory Visit: Payer: Self-pay

## 2019-04-15 ENCOUNTER — Inpatient Hospital Stay (HOSPITAL_COMMUNITY)
Admission: EM | Admit: 2019-04-15 | Discharge: 2019-04-21 | DRG: 871 | Disposition: A | Discharge status: Home Health | Payer: Medicare Other | Attending: Family Medicine | Admitting: Family Medicine

## 2019-04-15 ENCOUNTER — Emergency Department (HOSPITAL_COMMUNITY): Payer: Medicare Other

## 2019-04-15 ENCOUNTER — Encounter (HOSPITAL_COMMUNITY): Payer: Self-pay | Admitting: Emergency Medicine

## 2019-04-15 DIAGNOSIS — E876 Hypokalemia: Secondary | ICD-10-CM | POA: Diagnosis present

## 2019-04-15 DIAGNOSIS — M4807 Spinal stenosis, lumbosacral region: Secondary | ICD-10-CM | POA: Diagnosis present

## 2019-04-15 DIAGNOSIS — M899 Disorder of bone, unspecified: Secondary | ICD-10-CM | POA: Diagnosis not present

## 2019-04-15 DIAGNOSIS — E785 Hyperlipidemia, unspecified: Secondary | ICD-10-CM | POA: Diagnosis present

## 2019-04-15 DIAGNOSIS — M4804 Spinal stenosis, thoracic region: Secondary | ICD-10-CM | POA: Diagnosis not present

## 2019-04-15 DIAGNOSIS — I361 Nonrheumatic tricuspid (valve) insufficiency: Secondary | ICD-10-CM | POA: Diagnosis not present

## 2019-04-15 DIAGNOSIS — R945 Abnormal results of liver function studies: Secondary | ICD-10-CM | POA: Diagnosis present

## 2019-04-15 DIAGNOSIS — Z8249 Family history of ischemic heart disease and other diseases of the circulatory system: Secondary | ICD-10-CM

## 2019-04-15 DIAGNOSIS — Y92009 Unspecified place in unspecified non-institutional (private) residence as the place of occurrence of the external cause: Secondary | ICD-10-CM | POA: Diagnosis not present

## 2019-04-15 DIAGNOSIS — R112 Nausea with vomiting, unspecified: Secondary | ICD-10-CM | POA: Diagnosis not present

## 2019-04-15 DIAGNOSIS — G8929 Other chronic pain: Secondary | ICD-10-CM | POA: Diagnosis present

## 2019-04-15 DIAGNOSIS — J189 Pneumonia, unspecified organism: Secondary | ICD-10-CM | POA: Diagnosis present

## 2019-04-15 DIAGNOSIS — Z955 Presence of coronary angioplasty implant and graft: Secondary | ICD-10-CM | POA: Diagnosis not present

## 2019-04-15 DIAGNOSIS — Z20822 Contact with and (suspected) exposure to covid-19: Secondary | ICD-10-CM | POA: Diagnosis not present

## 2019-04-15 DIAGNOSIS — N1831 Chronic kidney disease, stage 3a: Secondary | ICD-10-CM | POA: Diagnosis present

## 2019-04-15 DIAGNOSIS — Z96641 Presence of right artificial hip joint: Secondary | ICD-10-CM | POA: Diagnosis present

## 2019-04-15 DIAGNOSIS — M5137 Other intervertebral disc degeneration, lumbosacral region: Secondary | ICD-10-CM | POA: Diagnosis not present

## 2019-04-15 DIAGNOSIS — S32612D Displaced avulsion fracture of left ischium, subsequent encounter for fracture with routine healing: Secondary | ICD-10-CM | POA: Diagnosis not present

## 2019-04-15 DIAGNOSIS — I517 Cardiomegaly: Secondary | ICD-10-CM | POA: Diagnosis not present

## 2019-04-15 DIAGNOSIS — I2510 Atherosclerotic heart disease of native coronary artery without angina pectoris: Secondary | ICD-10-CM | POA: Diagnosis present

## 2019-04-15 DIAGNOSIS — Z8673 Personal history of transient ischemic attack (TIA), and cerebral infarction without residual deficits: Secondary | ICD-10-CM

## 2019-04-15 DIAGNOSIS — I129 Hypertensive chronic kidney disease with stage 1 through stage 4 chronic kidney disease, or unspecified chronic kidney disease: Secondary | ICD-10-CM | POA: Diagnosis present

## 2019-04-15 DIAGNOSIS — A419 Sepsis, unspecified organism: Secondary | ICD-10-CM | POA: Diagnosis not present

## 2019-04-15 DIAGNOSIS — M51369 Other intervertebral disc degeneration, lumbar region without mention of lumbar back pain or lower extremity pain: Secondary | ICD-10-CM

## 2019-04-15 DIAGNOSIS — R7989 Other specified abnormal findings of blood chemistry: Secondary | ICD-10-CM

## 2019-04-15 DIAGNOSIS — Z66 Do not resuscitate: Secondary | ICD-10-CM | POA: Diagnosis present

## 2019-04-15 DIAGNOSIS — Z743 Need for continuous supervision: Secondary | ICD-10-CM | POA: Diagnosis not present

## 2019-04-15 DIAGNOSIS — M316 Other giant cell arteritis: Secondary | ICD-10-CM | POA: Diagnosis present

## 2019-04-15 DIAGNOSIS — E860 Dehydration: Secondary | ICD-10-CM | POA: Diagnosis present

## 2019-04-15 DIAGNOSIS — I480 Paroxysmal atrial fibrillation: Secondary | ICD-10-CM | POA: Diagnosis present

## 2019-04-15 DIAGNOSIS — Z7952 Long term (current) use of systemic steroids: Secondary | ICD-10-CM

## 2019-04-15 DIAGNOSIS — M5136 Other intervertebral disc degeneration, lumbar region: Secondary | ICD-10-CM

## 2019-04-15 DIAGNOSIS — R410 Disorientation, unspecified: Secondary | ICD-10-CM | POA: Diagnosis not present

## 2019-04-15 DIAGNOSIS — M80052A Age-related osteoporosis with current pathological fracture, left femur, initial encounter for fracture: Secondary | ICD-10-CM | POA: Diagnosis present

## 2019-04-15 DIAGNOSIS — G9341 Metabolic encephalopathy: Secondary | ICD-10-CM | POA: Diagnosis not present

## 2019-04-15 DIAGNOSIS — W1830XA Fall on same level, unspecified, initial encounter: Secondary | ICD-10-CM | POA: Diagnosis present

## 2019-04-15 DIAGNOSIS — R0602 Shortness of breath: Secondary | ICD-10-CM | POA: Diagnosis not present

## 2019-04-15 DIAGNOSIS — Z7901 Long term (current) use of anticoagulants: Secondary | ICD-10-CM

## 2019-04-15 DIAGNOSIS — I248 Other forms of acute ischemic heart disease: Secondary | ICD-10-CM | POA: Diagnosis not present

## 2019-04-15 DIAGNOSIS — M549 Dorsalgia, unspecified: Secondary | ICD-10-CM | POA: Diagnosis not present

## 2019-04-15 DIAGNOSIS — R000 Tachycardia, unspecified: Secondary | ICD-10-CM | POA: Diagnosis not present

## 2019-04-15 DIAGNOSIS — M47816 Spondylosis without myelopathy or radiculopathy, lumbar region: Secondary | ICD-10-CM | POA: Diagnosis not present

## 2019-04-15 DIAGNOSIS — M48061 Spinal stenosis, lumbar region without neurogenic claudication: Secondary | ICD-10-CM | POA: Diagnosis not present

## 2019-04-15 DIAGNOSIS — Z79899 Other long term (current) drug therapy: Secondary | ICD-10-CM

## 2019-04-15 DIAGNOSIS — M810 Age-related osteoporosis without current pathological fracture: Secondary | ICD-10-CM | POA: Diagnosis present

## 2019-04-15 DIAGNOSIS — Z808 Family history of malignant neoplasm of other organs or systems: Secondary | ICD-10-CM

## 2019-04-15 DIAGNOSIS — R6520 Severe sepsis without septic shock: Secondary | ICD-10-CM | POA: Diagnosis present

## 2019-04-15 DIAGNOSIS — Z981 Arthrodesis status: Secondary | ICD-10-CM

## 2019-04-15 DIAGNOSIS — R0689 Other abnormalities of breathing: Secondary | ICD-10-CM | POA: Diagnosis not present

## 2019-04-15 DIAGNOSIS — S32602A Unspecified fracture of left ischium, initial encounter for closed fracture: Secondary | ICD-10-CM | POA: Diagnosis not present

## 2019-04-15 DIAGNOSIS — R1084 Generalized abdominal pain: Secondary | ICD-10-CM | POA: Diagnosis not present

## 2019-04-15 DIAGNOSIS — Z803 Family history of malignant neoplasm of breast: Secondary | ICD-10-CM

## 2019-04-15 DIAGNOSIS — M069 Rheumatoid arthritis, unspecified: Secondary | ICD-10-CM | POA: Diagnosis present

## 2019-04-15 DIAGNOSIS — I499 Cardiac arrhythmia, unspecified: Secondary | ICD-10-CM | POA: Diagnosis not present

## 2019-04-15 LAB — URINALYSIS, ROUTINE W REFLEX MICROSCOPIC
Bilirubin Urine: NEGATIVE
Glucose, UA: NEGATIVE mg/dL
Ketones, ur: NEGATIVE mg/dL
Leukocytes,Ua: NEGATIVE
Nitrite: NEGATIVE
Protein, ur: NEGATIVE mg/dL
Specific Gravity, Urine: >1.046 — ABNORMAL HIGH (ref 1.005–1.030)
pH: 6 (ref 5.0–8.0)

## 2019-04-15 LAB — CBC WITH DIFFERENTIAL/PLATELET
Abs Immature Granulocytes: 0.08 10*3/uL — ABNORMAL HIGH (ref 0.00–0.07)
Basophils Absolute: 0.1 10*3/uL (ref 0.0–0.1)
Basophils Relative: 0 %
Eosinophils Absolute: 0 10*3/uL (ref 0.0–0.5)
Eosinophils Relative: 0 %
HCT: 41.1 % (ref 36.0–46.0)
Hemoglobin: 12.8 g/dL (ref 12.0–15.0)
Immature Granulocytes: 1 %
Lymphocytes Relative: 14 %
Lymphs Abs: 2.2 10*3/uL (ref 0.7–4.0)
MCH: 28.9 pg (ref 26.0–34.0)
MCHC: 31.1 g/dL (ref 30.0–36.0)
MCV: 92.8 fL (ref 80.0–100.0)
Monocytes Absolute: 1.7 10*3/uL — ABNORMAL HIGH (ref 0.1–1.0)
Monocytes Relative: 11 %
Neutro Abs: 11.9 10*3/uL — ABNORMAL HIGH (ref 1.7–7.7)
Neutrophils Relative %: 74 %
Platelets: 204 10*3/uL (ref 150–400)
RBC: 4.43 MIL/uL (ref 3.87–5.11)
RDW: 15.5 % (ref 11.5–15.5)
WBC: 15.9 10*3/uL — ABNORMAL HIGH (ref 4.0–10.5)
nRBC: 0 % (ref 0.0–0.2)

## 2019-04-15 LAB — BASIC METABOLIC PANEL
Anion gap: 12 (ref 5–15)
BUN: 11 mg/dL (ref 8–23)
CO2: 23 mmol/L (ref 22–32)
Calcium: 7.6 mg/dL — ABNORMAL LOW (ref 8.9–10.3)
Chloride: 103 mmol/L (ref 98–111)
Creatinine, Ser: 1.03 mg/dL — ABNORMAL HIGH (ref 0.44–1.00)
GFR calc Af Amer: 59 mL/min — ABNORMAL LOW (ref >60)
GFR calc non Af Amer: 51 mL/min — ABNORMAL LOW (ref >60)
Glucose, Bld: 104 mg/dL — ABNORMAL HIGH (ref 70–99)
Potassium: 3.3 mmol/L — ABNORMAL LOW (ref 3.5–5.1)
Sodium: 138 mmol/L (ref 135–145)

## 2019-04-15 LAB — COMPREHENSIVE METABOLIC PANEL
ALT: 36 U/L (ref 0–44)
AST: 46 U/L — ABNORMAL HIGH (ref 15–41)
Albumin: 3.3 g/dL — ABNORMAL LOW (ref 3.5–5.0)
Alkaline Phosphatase: 101 U/L (ref 38–126)
Anion gap: 15 (ref 5–15)
BUN: 11 mg/dL (ref 8–23)
CO2: 29 mmol/L (ref 22–32)
Calcium: 8.9 mg/dL (ref 8.9–10.3)
Chloride: 96 mmol/L — ABNORMAL LOW (ref 98–111)
Creatinine, Ser: 1.01 mg/dL — ABNORMAL HIGH (ref 0.44–1.00)
GFR calc Af Amer: >60 mL/min (ref >60)
GFR calc non Af Amer: 52 mL/min — ABNORMAL LOW (ref >60)
Glucose, Bld: 136 mg/dL — ABNORMAL HIGH (ref 70–99)
Potassium: 2.7 mmol/L — CL (ref 3.5–5.1)
Sodium: 140 mmol/L (ref 135–145)
Total Bilirubin: 2.5 mg/dL — ABNORMAL HIGH (ref 0.3–1.2)
Total Protein: 6.3 g/dL — ABNORMAL LOW (ref 6.5–8.1)

## 2019-04-15 LAB — BRAIN NATRIURETIC PEPTIDE: B Natriuretic Peptide: 170.2 pg/mL — ABNORMAL HIGH (ref 0.0–100.0)

## 2019-04-15 LAB — I-STAT CHEM 8, ED
BUN: 10 mg/dL (ref 8–23)
Calcium, Ion: 0.94 mmol/L — ABNORMAL LOW (ref 1.15–1.40)
Chloride: 101 mmol/L (ref 98–111)
Creatinine, Ser: 0.8 mg/dL (ref 0.44–1.00)
Glucose, Bld: 122 mg/dL — ABNORMAL HIGH (ref 70–99)
HCT: 40 % (ref 36.0–46.0)
Hemoglobin: 13.6 g/dL (ref 12.0–15.0)
Potassium: 2.6 mmol/L — CL (ref 3.5–5.1)
Sodium: 139 mmol/L (ref 135–145)
TCO2: 28 mmol/L (ref 22–32)

## 2019-04-15 LAB — TROPONIN I (HIGH SENSITIVITY)
Troponin I (High Sensitivity): 88 ng/L — ABNORMAL HIGH (ref <18)
Troponin I (High Sensitivity): 174 ng/L (ref <18)
Troponin I (High Sensitivity): 312 ng/L (ref <18)

## 2019-04-15 LAB — C-REACTIVE PROTEIN: CRP: 17.4 mg/dL — ABNORMAL HIGH (ref <1.0)

## 2019-04-15 LAB — SEDIMENTATION RATE: Sed Rate: 25 mm/hr — ABNORMAL HIGH (ref 0–22)

## 2019-04-15 LAB — BILIRUBIN, DIRECT: Bilirubin, Direct: 0.6 mg/dL — ABNORMAL HIGH (ref 0.0–0.2)

## 2019-04-15 LAB — PROTIME-INR
INR: 1.3 — ABNORMAL HIGH (ref 0.8–1.2)
Prothrombin Time: 16.5 seconds — ABNORMAL HIGH (ref 11.4–15.2)

## 2019-04-15 LAB — LACTIC ACID, PLASMA
Lactic Acid, Venous: 2.4 mmol/L (ref 0.5–1.9)
Lactic Acid, Venous: 2.6 mmol/L (ref 0.5–1.9)

## 2019-04-15 LAB — POC SARS CORONAVIRUS 2 AG -  ED: SARS Coronavirus 2 Ag: NEGATIVE

## 2019-04-15 LAB — LIPASE, BLOOD: Lipase: 28 U/L (ref 11–51)

## 2019-04-15 LAB — APTT: aPTT: 34 seconds (ref 24–36)

## 2019-04-15 LAB — CK: Total CK: 78 U/L (ref 38–234)

## 2019-04-15 LAB — AMMONIA: Ammonia: 28 umol/L (ref 9–35)

## 2019-04-15 MED ORDER — POTASSIUM CHLORIDE 20 MEQ/15ML (10%) PO SOLN
20.0000 meq | ORAL | Status: AC
  Administered 2019-04-15 (×3): 20 meq via ORAL
  Filled 2019-04-15 (×3): qty 15

## 2019-04-15 MED ORDER — SODIUM CHLORIDE 0.9 % IV SOLN: INTRAVENOUS | Status: AC

## 2019-04-15 MED ORDER — SODIUM CHLORIDE 0.9 % IV BOLUS
1000.0000 mL | Freq: Once | INTRAVENOUS | Status: AC
  Administered 2019-04-15: 14:00:00 1000 mL via INTRAVENOUS

## 2019-04-15 MED ORDER — ASCORBIC ACID 500 MG PO TABS
1000.0000 mg | ORAL_TABLET | Freq: Every day | ORAL | Status: DC
  Administered 2019-04-15 – 2019-04-21 (×7): 1000 mg via ORAL
  Filled 2019-04-15 (×7): qty 2

## 2019-04-15 MED ORDER — ROSUVASTATIN CALCIUM 5 MG PO TABS
10.0000 mg | ORAL_TABLET | Freq: Every day | ORAL | Status: DC
  Administered 2019-04-15 – 2019-04-20 (×6): 10 mg via ORAL
  Filled 2019-04-15 (×6): qty 2

## 2019-04-15 MED ORDER — POTASSIUM CHLORIDE 10 MEQ/100ML IV SOLN
10.0000 meq | INTRAVENOUS | Status: AC
  Administered 2019-04-15 (×3): 10 meq via INTRAVENOUS
  Filled 2019-04-15 (×3): qty 100

## 2019-04-15 MED ORDER — POTASSIUM CITRATE ER 10 MEQ (1080 MG) PO TBCR: 20.0000 meq | EXTENDED_RELEASE_TABLET | ORAL | Status: DC

## 2019-04-15 MED ORDER — SODIUM CHLORIDE 0.9 % IV BOLUS
1000.0000 mL | Freq: Once | INTRAVENOUS | Status: AC
  Administered 2019-04-15: 1000 mL via INTRAVENOUS

## 2019-04-15 MED ORDER — HEPARIN SODIUM (PORCINE) 5000 UNIT/ML IJ SOLN
5000.0000 [IU] | Freq: Three times a day (TID) | INTRAMUSCULAR | Status: DC
  Administered 2019-04-15 – 2019-04-17 (×5): 5000 [IU] via SUBCUTANEOUS
  Filled 2019-04-15 (×5): qty 1

## 2019-04-15 MED ORDER — VITAMIN D 25 MCG (1000 UNIT) PO TABS
1000.0000 [IU] | ORAL_TABLET | Freq: Every day | ORAL | Status: DC
  Administered 2019-04-15 – 2019-04-21 (×7): 1000 [IU] via ORAL
  Filled 2019-04-15 (×7): qty 1

## 2019-04-15 MED ORDER — HYDROMORPHONE HCL 1 MG/ML IJ SOLN
0.5000 mg | INTRAMUSCULAR | Status: DC | PRN
  Administered 2019-04-15 – 2019-04-21 (×12): 0.5 mg via INTRAVENOUS
  Filled 2019-04-15 (×13): qty 1

## 2019-04-15 MED ORDER — METRONIDAZOLE IN NACL 5-0.79 MG/ML-% IV SOLN
500.0000 mg | Freq: Once | INTRAVENOUS | Status: AC
  Administered 2019-04-15: 500 mg via INTRAVENOUS
  Filled 2019-04-15: qty 100

## 2019-04-15 MED ORDER — ESCITALOPRAM OXALATE 10 MG PO TABS
10.0000 mg | ORAL_TABLET | Freq: Every day | ORAL | Status: DC
  Administered 2019-04-16 – 2019-04-21 (×6): 10 mg via ORAL
  Filled 2019-04-15 (×6): qty 1

## 2019-04-15 MED ORDER — VANCOMYCIN HCL IN DEXTROSE 1-5 GM/200ML-% IV SOLN: 1000.0000 mg | Freq: Once | INTRAVENOUS | Status: DC

## 2019-04-15 MED ORDER — ONDANSETRON HCL 4 MG/2ML IJ SOLN
4.0000 mg | Freq: Four times a day (QID) | INTRAMUSCULAR | Status: DC | PRN
  Administered 2019-04-16: 4 mg via INTRAVENOUS
  Filled 2019-04-15: qty 2

## 2019-04-15 MED ORDER — ONDANSETRON HCL 4 MG/2ML IJ SOLN
4.0000 mg | Freq: Once | INTRAMUSCULAR | Status: AC
  Administered 2019-04-15: 4 mg via INTRAVENOUS
  Filled 2019-04-15: qty 2

## 2019-04-15 MED ORDER — HYDROMORPHONE HCL 1 MG/ML IJ SOLN
0.5000 mg | Freq: Once | INTRAMUSCULAR | Status: AC
  Administered 2019-04-15: 0.5 mg via INTRAVENOUS
  Filled 2019-04-15: qty 1

## 2019-04-15 MED ORDER — METOPROLOL TARTRATE 5 MG/5ML IV SOLN
5.0000 mg | Freq: Four times a day (QID) | INTRAVENOUS | Status: DC
  Administered 2019-04-16 – 2019-04-17 (×6): 5 mg via INTRAVENOUS
  Filled 2019-04-15 (×6): qty 5

## 2019-04-15 MED ORDER — VITAMIN E 180 MG (400 UNIT) PO CAPS
400.0000 [IU] | ORAL_CAPSULE | Freq: Every day | ORAL | Status: DC
  Administered 2019-04-16 – 2019-04-21 (×6): 400 [IU] via ORAL
  Filled 2019-04-15 (×7): qty 1

## 2019-04-15 MED ORDER — PANTOPRAZOLE SODIUM 40 MG PO TBEC
40.0000 mg | DELAYED_RELEASE_TABLET | Freq: Every day | ORAL | Status: DC
  Administered 2019-04-15 – 2019-04-21 (×7): 40 mg via ORAL
  Filled 2019-04-15 (×7): qty 1

## 2019-04-15 MED ORDER — LIDOCAINE 5 % EX OINT: 1.0000 "application " | TOPICAL_OINTMENT | Freq: Three times a day (TID) | CUTANEOUS | Status: DC | PRN

## 2019-04-15 MED ORDER — SODIUM CHLORIDE 0.9 % IV SOLN
2.0000 g | Freq: Two times a day (BID) | INTRAVENOUS | Status: DC
  Administered 2019-04-16 – 2019-04-21 (×11): 2 g via INTRAVENOUS
  Filled 2019-04-15 (×13): qty 2

## 2019-04-15 MED ORDER — SODIUM CHLORIDE 0.9 % IV SOLN
2.0000 g | Freq: Once | INTRAVENOUS | Status: AC
  Administered 2019-04-15: 2 g via INTRAVENOUS
  Filled 2019-04-15: qty 2

## 2019-04-15 MED ORDER — ACETAMINOPHEN 325 MG PO TABS
650.0000 mg | ORAL_TABLET | Freq: Four times a day (QID) | ORAL | Status: DC | PRN
  Administered 2019-04-15: 650 mg via ORAL
  Filled 2019-04-15: qty 2

## 2019-04-15 MED ORDER — DICYCLOMINE HCL 20 MG PO TABS
20.0000 mg | ORAL_TABLET | Freq: Two times a day (BID) | ORAL | Status: DC | PRN
  Filled 2019-04-15: qty 1

## 2019-04-15 MED ORDER — HYDROCODONE-ACETAMINOPHEN 5-325 MG PO TABS
1.0000 | ORAL_TABLET | Freq: Four times a day (QID) | ORAL | Status: DC | PRN
  Administered 2019-04-15 – 2019-04-21 (×16): 1 via ORAL
  Filled 2019-04-15 (×17): qty 1

## 2019-04-15 MED ORDER — HYDROCORTISONE NA SUCCINATE PF 100 MG IJ SOLR
50.0000 mg | Freq: Four times a day (QID) | INTRAMUSCULAR | Status: DC
  Administered 2019-04-15 – 2019-04-18 (×11): 50 mg via INTRAVENOUS
  Filled 2019-04-15 (×11): qty 2

## 2019-04-15 MED ORDER — POTASSIUM CHLORIDE CRYS ER 20 MEQ PO TBCR: 20.0000 meq | EXTENDED_RELEASE_TABLET | ORAL | Status: DC

## 2019-04-15 MED ORDER — VANCOMYCIN HCL IN DEXTROSE 1-5 GM/200ML-% IV SOLN
1000.0000 mg | INTRAVENOUS | Status: DC
  Administered 2019-04-16: 1000 mg via INTRAVENOUS
  Filled 2019-04-15: qty 200

## 2019-04-15 MED ORDER — VANCOMYCIN HCL 1500 MG/300ML IV SOLN
1500.0000 mg | Freq: Once | INTRAVENOUS | Status: AC
  Administered 2019-04-15: 1500 mg via INTRAVENOUS
  Filled 2019-04-15: qty 300

## 2019-04-15 MED ORDER — IOHEXOL 350 MG/ML SOLN
100.0000 mL | Freq: Once | INTRAVENOUS | Status: AC | PRN
  Administered 2019-04-15: 13:00:00 100 mL via INTRAVENOUS

## 2019-04-15 MED ORDER — POTASSIUM CHLORIDE CRYS ER 20 MEQ PO TBCR
20.0000 meq | EXTENDED_RELEASE_TABLET | Freq: Every day | ORAL | Status: DC
  Administered 2019-04-15: 16:00:00 20 meq via ORAL
  Filled 2019-04-15: qty 1

## 2019-04-15 NOTE — ED Provider Notes (Signed)
[facility] EMERGENCY DEPARTMENT Provider Note   CSN: [id number] Arrival date & time:        History Chief Complaint  Patient presents with  . Altered Mental Status    Betty Jackson is a 85 y.o. female who presents emergency department via EMS for vomiting and somnolence.  She has a past medical history of multiple autoimmune disorders including RA, temporal arteritis.  She has known pelvic fractures and chronic back pain.  She is on chronic opiate therapy and takes Vicodin 5-325 mg 4 times a day.  History is gathered by the patient and her daughter.  There is a level 5 caveat due to the patient's current mental status.  Daughter states that she has a home monitor where she could see her mother sleeping.  She could see that she seemed somewhat distressed and was tossing and turning.  She asked her other sister to go over as she was out of town.  When the other daughter arrived she found her mother very somnolent and lethargic, seeming somewhat confused.  She had vomited in the bed.  Her recent medical history includes a viral pericarditis with pericardial effusion and cardiac tamponade requiring pericardial window back in late December.  Patient did have her Covid vaccination, the first dose, 4 days ago.  The patient denies constipation, abdominal pain.  She is notably tachycardic and tachypneic with low to mid 90s oxygen saturations.  HPI     Past Medical History:  Diagnosis Date  . Abdominal pain, epigastric 06/22/2013  . Abdominal pain, other specified site 12/24/2011  . Abnormal urinalysis 05/20/2017  . Acute respiratory failure with hypoxia ([place]) 05/20/2017  . Anemia, unspecified 10/28/2012  . Anxiety state, unspecified 10/28/2012  . Bruises easily    d/t being on prednisone and plavix  . CAD (coronary artery disease)    a. Stent RCA in [facility];  b. Cath approx 2009 - nonobs per pt report.  . Cataract    immature on the left eye  . Chronic insomnia  02/07/2013  . Chronic lower back pain    scoliosis  . CKD (chronic kidney disease) stage 3, GFR 30-59 ml/min   . Complication of anesthesia    pt has a very high tolerance to meds  . Coronary disease 05/20/2017  . CVA (cerebral infarction) 10/29/2012  . DDD (degenerative disc disease)   . Depression   . Diverticulitis    hx of  . Diverticulosis   . Elevated transaminase level 02/07/2013  . Enteritis   . GERD (gastroesophageal reflux disease) 09/01/2010  . Giant cell arteritis ([place])   . Hemorrhoids   . Herniated nucleus pulposus, L5-S1, right 11/04/2015  . History of scabies   . HTN (hypertension) 05/20/2017  . Hyperlipidemia    takes Lipitor daily  . Hypertension    takes Amlodipine,Losartan,Metoprolol,and HCTZ daily  . Incontinence of urine   . Influenza A 05/20/2017  . Insomnia    takes Restoril nightly  . Lumbar stenosis 04/25/2013  . Major depressive disorder, recurrent episode, moderate ([place]) 07/16/2013  . Migraines    "back in my 20's; none since" (10/28/2012)  . Osteoporosis   . PAF (paroxysmal atrial fibrillation) ([place]) 2011   a. lone epidode in 2011 according to notes.  . Rheumatoid arthritis ([place])   . Scoliosis   . Sepsis ([place]) 05/20/2017  . Sinus bradycardia    a. on chronic bb  . Temporal arteritis ([place]) 2011   a. followed @ [facility]; potential  flareup 10/28/2012/notes 10/28/2012  . Vocal cord dysfunction    "they don't operate properly" (10/28/2012)    Patient Active Problem List   Diagnosis Date Noted  . Pericardial effusion without cardiac tamponade 03/29/2019  . Hypoalbuminemia 03/27/2019  . Cardiac tamponade   . Pericarditis 03/06/2019  . Fever   . Septic shock ([place])   . Atypical chest pain   . Atrial fibrillation with rapid ventricular response ([place])   . Nausea 01/17/2019  . Acute diverticulitis 01/13/2019  . AKI (acute kidney injury) ([place]) 01/13/2019  . Chronic pain 01/13/2019  . Tachycardia 01/13/2019  . Hypokalemia 01/13/2019  . Chronic low back pain  07/10/2018  . Influenza A 05/20/2017  . Sepsis ([place]) 05/20/2017  . Acute respiratory failure with hypoxia ([place]) 05/20/2017  . CKD (chronic kidney disease) stage 3, GFR 30-59 ml/min 05/20/2017  . PAF (paroxysmal atrial fibrillation) ([place]) 05/20/2017  . HTN (hypertension) 05/20/2017  . Giant cell arteritis ([place]) 05/20/2017  . Coronary disease 05/20/2017  . Abnormal urinalysis 05/20/2017  . Osteoporosis 05/08/2016  . Herniated nucleus pulposus, L5-S1, right 11/04/2015  . Major depressive disorder, recurrent episode, moderate ([place]) 07/16/2013  . Abdominal pain, epigastric 06/22/2013  . Lumbar stenosis 04/25/2013  . Chronic insomnia 02/07/2013  . Elevated transaminase level 02/07/2013  . CVA (cerebral infarction) 10/29/2012  . Visual changes 10/28/2012  . Depression 10/28/2012  . Anxiety state, unspecified 10/28/2012  . Anemia, unspecified 10/28/2012  . Nonspecific (abnormal) findings on radiological and other examination of gastrointestinal tract 01/25/2012  . Hyponatremia 01/24/2012  . Hematuria 01/24/2012  . Enteritis 12/24/2011  . Abdominal pain, other specified site 12/24/2011  . Leg pain, right 02/18/2011  . Temporal arteritis ([place]) 09/01/2010  . Hypertension 09/01/2010  . Hyperlipidemia 09/01/2010  . History of atrial fibrillation 09/01/2010  . CAD (coronary artery disease) 09/01/2010  . GERD (gastroesophageal reflux disease) 09/01/2010  . Insomnia 09/01/2010    Past Surgical History:  Procedure Laterality Date  . ABDOMINAL HYSTERECTOMY  ~ 1984   vaginally  . BACK SURGERY  7-66yrs ago   X Stop  . BLADDER SUSPENSION  2001  . BREAST BIOPSY Right   . CATARACT EXTRACTION W/ INTRAOCULAR LENS IMPLANT Right ~ 08/2012  . CHEST TUBE INSERTION Left 03/08/2019   Procedure: Chest Tube Insertion;  Surgeon: Ivin Poot, MD;  Location: [facility];  Service: Thoracic;  Laterality: Left;  . COLONOSCOPY  01/26/2012   Procedure: COLONOSCOPY;  Surgeon: Ladene Artist, MD,FACG;  Location:  [facility] ENDOSCOPY;  Service: Endoscopy;  Laterality: N/A;  note the EGD is possible  . CORONARY ANGIOPLASTY WITH STENT PLACEMENT  2006   X 1 stent  . EPIDURAL BLOCK INJECTION    . ESOPHAGOGASTRODUODENOSCOPY  01/26/2012   Procedure: ESOPHAGOGASTRODUODENOSCOPY (EGD);  Surgeon: Ladene Artist, MD,FACG;  Location: [facility] ENDOSCOPY;  Service: Endoscopy;  Laterality: N/A;  . HEMIARTHROPLASTY HIP Right 2012  . LAPAROSCOPIC CHOLECYSTECTOMY  2001  . LUMBAR FUSION  03/2013  . LUMBAR LAMINECTOMY/DECOMPRESSION MICRODISCECTOMY Right 11/04/2015   Procedure: Right Lumbar Five-Sacral One Microdiskectomy;  Surgeon: Kristeen Miss, MD;  Location: [facility] NEURO ORS;  Service: Neurosurgery;  Laterality: Right;  Right L5-S1 Microdiskectomy  . moles removed that required stiches     one on leg and one on face  . SUBXYPHOID PERICARDIAL WINDOW N/A 03/08/2019   Procedure: SUBXYPHOID PERICARDIAL WINDOW;  Surgeon: Ivin Poot, MD;  Location: [facility];  Service: Thoracic;  Laterality: N/A;  . TEE WITHOUT CARDIOVERSION N/A 03/08/2019   Procedure: TRANSESOPHAGEAL ECHOCARDIOGRAM (TEE);  Surgeon:  Ivin Poot, MD;  Location: [facility];  Service: Thoracic;  Laterality: N/A;  . TEMPORAL ARTERY BIOPSY / LIGATION Bilateral 2011  . TONSILLECTOMY AND ADENOIDECTOMY     at age 8  . TUBAL LIGATION  ~ 1982  . X-STOP IMPLANTATION  ~ 2010   "lower back" (10/28/2012)     OB History   No obstetric history on file.     Family History  Problem Relation Age of Onset  . Brain cancer Mother 2  . Heart attack Father   . Breast cancer Daughter 75  . Heart disease Other   . Hypertension Other   . Arthritis Neg Hx   . Colon cancer Neg Hx   . Osteoporosis Neg Hx     Social History   Tobacco Use  . Smoking status: Never Smoker  . Smokeless tobacco: Never Used  Substance Use Topics  . Alcohol use: Yes    Comment: socially  . Drug use: No    Home Medications Prior to Admission medications   Medication Sig Start Date End Date Taking?  Authorizing Provider  cholecalciferol (VITAMIN D) 1000 UNITS tablet Take 1,000 Units by mouth daily.     [provider]  dicyclomine (BENTYL) 20 MG tablet Take 1 tablet (20 mg total) by mouth 2 (two) times daily as needed for spasms. 01/12/19   Gareth Morgan, MD  diltiazem (CARDIZEM) 60 MG tablet Take 1 tablet (60 mg total) by mouth 3 (three) times daily. 01/31/19   Burnell Blanks, MD  ELIQUIS 5 MG TABS tablet TAKE 1 TABLET BY MOUTH TWICE A DAY Patient taking differently: Take 5 mg by mouth 2 (two) times daily.  11/15/18   Burnell Blanks, MD  escitalopram (LEXAPRO) 10 MG tablet TAKE 1 TABLET BY MOUTH EVERY DAY Patient taking differently: Take 10 mg by mouth daily.  12/14/18   Burchette, Alinda Sierras, MD  furosemide (LASIX) 20 MG tablet Take 2 tablets (40 mg total) by mouth 2 (two) times daily. 03/13/19 04/12/19  Hollice Gong, Mir Earlie Server, MD  HYDROcodone-acetaminophen (NORCO/VICODIN) 5-325 MG tablet Take one tablet by mouth every 8 hours as needed for pain. May refill in two months. Patient taking differently: Take 1 tablet by mouth every 6 (six) hours as needed for moderate pain.  08/05/18   Burchette, Alinda Sierras, MD  lidocaine (XYLOCAINE) 5 % ointment Apply 1 application topically 3 (three) times daily as needed. 04/13/19   Long, Wonda Olds, MD  metoprolol tartrate (LOPRESSOR) 100 MG tablet Take 1 tablet (100 mg total) by mouth 2 (two) times daily. 01/20/19   Rai, Ripudeep K, MD  ondansetron (ZOFRAN ODT) 4 MG disintegrating tablet Take 1 tablet (4 mg total) by mouth every 8 (eight) hours as needed for nausea or vomiting. 01/12/19   Gareth Morgan, MD  pantoprazole (PROTONIX) 40 MG tablet TAKE 1 TABLET BY MOUTH EVERY DAY 02/17/19   Burchette, Alinda Sierras, MD  potassium chloride SA (KLOR-CON) 20 MEQ tablet Take 1 tablet (20 mEq total) by mouth daily. 01/20/19   Rai, Ripudeep K, MD  predniSONE (DELTASONE) 10 MG tablet 4 tablets for 3 days, 3 tablets for 3 days, 2 tablets for 3 days, 1  tablet continue Patient taking differently: Take 10 mg by mouth daily.  05/26/17   Reyne Dumas, MD  rosuvastatin (CRESTOR) 10 MG tablet Take 10 mg by mouth at bedtime. 03/05/19   [provider]  vitamin C (ASCORBIC ACID) 500 MG tablet Take 1,000 mg by mouth daily.  [provider]  vitamin E (VITAMIN E) 400 UNIT capsule Take 400 Units by mouth daily.    [provider]    Allergies    Dextromethorphan  Review of Systems   Review of Systems Ten systems reviewed and are negative for acute change, except as noted in the HPI.   Physical Exam Updated Vital Signs BP 132/63   Pulse (!) 125   Temp 99 F (37.2 C) (Oral)   Resp (!) 31   Ht 5' 4.5" (1.638 m)   Wt 70.3 kg   SpO2 95%   BMI 26.19 kg/m   Physical Exam Vitals and nursing note reviewed.  Constitutional:      General: She is not in acute distress.    Appearance: She is well-developed. She is not diaphoretic.  HENT:     Head: Normocephalic and atraumatic.  Eyes:     General: No scleral icterus.    Conjunctiva/sclera: Conjunctivae normal.  Cardiovascular:     Rate and Rhythm: Regular rhythm. Tachycardia present.     Heart sounds: Normal heart sounds. No murmur. No friction rub. No gallop.   Pulmonary:     Effort: Pulmonary effort is normal. No respiratory distress.     Breath sounds: Normal breath sounds.     Comments: Tachypnea  Abdominal:     General: Bowel sounds are normal. There is no distension.     Palpations: Abdomen is soft. There is no mass.     Tenderness: There is no abdominal tenderness. There is no guarding.  Musculoskeletal:     Cervical back: Normal range of motion.  Skin:    General: Skin is warm and dry.  Neurological:     Mental Status: She is alert and oriented to person, place, and time.  Psychiatric:        Behavior: Behavior normal.     ED Results / Procedures / Treatments   Labs (all labs ordered are listed, but only abnormal results are displayed) Labs  Reviewed - No data to display  EKG EKG Interpretation  Date/Time:  Saturday April 15 2019 10:48:55 EST Ventricular Rate:  127 PR Interval:    QRS Duration: 85 QT Interval:  320 QTC Calculation: 460 R Axis:   -41 Text Interpretation: Sinus tachycardia Multiple premature complexes, vent & supraven Borderline prolonged PR interval Left axis deviation Low voltage, precordial leads Abnormal R-wave progression, early transition Borderline repolarization abnormality Confirmed by Veryl Speak [phone]) on 04/15/2019 10:54:49 AM   Radiology DG Hip Unilat W or Wo Pelvis 2-3 Views Right  Result Date: 04/13/2019 CLINICAL DATA:  Fall with right-sided hip pain EXAM: DG HIP (WITH OR WITHOUT PELVIS) 2-3V RIGHT COMPARISON:  08/17/2018, CT 03/27/2019 FINDINGS: SI joints are non widened. CT described sacral insufficiency fractures are not well seen on the radiograph. Pubic symphysis and rami appear intact. Subacute to chronic avulsion fracture off the left ischial tuberosity again noted. Status post right hip replacement with intact hardware and normal alignment. No acute fracture is seen. IMPRESSION: 1. Status post right hip replacement without acute osseous abnormality. 2. Subacute to chronic avulsion fracture off the left ischial tuberosity as before. Electronically Signed   By: Donavan Foil M.D.   On: 04/13/2019 17:57    Procedures .Critical Care Performed by: Margarita Mail, PA-C Authorized by: Margarita Mail, PA-C   Critical care provider statement:    Critical care time (minutes):  60   Critical care time was exclusive of:  Separately billable procedures and treating other patients  Critical care was necessary to treat or prevent imminent or life-threatening deterioration of the following conditions:  Sepsis (hypokalemia)   Critical care was time spent personally by me on the following activities:  Discussions with consultants, evaluation of patient's response to treatment, examination of  patient, ordering and performing treatments and interventions, ordering and review of laboratory studies, ordering and review of radiographic studies, pulse oximetry, re-evaluation of patient's condition, obtaining history from patient or surrogate and review of old charts   (including critical care time)  Medications Ordered in ED Medications - No data to display  ED Course  I have reviewed the triage vital signs and the nursing notes.  Pertinent labs & imaging results that were available during my care of the patient were reviewed by me and considered in my medical decision making (see chart for details).  Clinical Course as of Apr 15 1515  Sat Apr 15, 2019  1201 Potassium(!!): 2.6 [NB]  1218 Bilirubin is elevated as compared to previous  Total Bilirubin(!): 2.5 [AH]  1223 Troponin is elevated, but improved from previous   Troponin I (High Sensitivity)(!): 88 [AH]  1438 Lactic Acid, Venous(!!): 2.6 [AH]  1516 Potassium(!!): 2.7 [AH]    Clinical Course User Index [AH] Margarita Mail, PA-C [NB] Nicanor Bake   MDM Rules/Calculators/A&P                      85 y/o F with a pmh of chronic back pain, pelvic known pelvic fracture, multiple autoimmune disorders, on prednisone, osteoporosis, history of diverticulitis, history of sepsis who presents with altered mental status, lethargy, vomiting and severe pain.  Patient also had a recent pericardial window for pericarditis with cardiac tamponade. The differential diagnosis for AMS is extensive and includes, but is not limited to: drug overdose - opioids, alcohol, sedatives, antipsychotics, drug withdrawal, others; Metabolic: hypoxia, hypoglycemia, hyperglycemia, hypercalcemia, hypernatremia, hyponatremia, uremia, hepatic encephalopathy, hypothyroidism, hyperthyroidism, vitamin B12 or thiamine deficiency, carbon monoxide poisoning, Wilson's disease, Lactic acidosis, DKA/HHOS; Infectious: meningitis, encephalitis,  bacteremia/sepsis, urinary tract infection, pneumonia, neurosyphilis; Structural: Space-occupying lesion, (brain tumor, subdural hematoma, hydrocephalus,); Vascular: stroke, subarachnoid hemorrhage, coronary ischemia, hypertensive encephalopathy, CNS vasculitis, thrombotic thrombocytopenic purpura, disseminated intravascular coagulation, hyperviscosity; Psychiatric: Schizophrenia, depression; Other: Seizure, hypothermia, heat stroke, ICU psychosis, dementia -"sundowning." Differential also includes recurrent pericarditis, pulmonary embolus, pneumonia, UTI, other source of infection, thoracic fracture causing splinting and tachypnea. Review of patient's labs shows elevated lactic acid, white blood cell count, in the setting of tachypnea and tachycardia she will be treated for sepsis with broad-spectrum antibiotics and fluids.  Patient has not been able to produce urine.  Potassium is low at 2.6 and she is getting 3 rounds IV and will need continued repletion.  Patient also noted to have elevated bilirubin of unknown etiology.  Reviewed the patient's chest x-ray, CT chest abdomen and pelvis.  There is no evidence of infection although there are groundglass opacities in the chest which may show infection, she has no recurrent pericarditis or pericardial effusion.  Of concern are new lytic lesions in the pelvis and lumbar spine that are concerning for potential metastatic disease.  I spoke personally with Dr. Candise Che about the CT to make sure this did not appear to be osteomyelitis in the setting of the fractures however he assures me that that is not the case.  The patient will be admitted by Dr. Roosevelt Locks who will admit the patient. I have updated the patient's daughter.   Ivone L Wolken was evaluated in  Emergency Department on 04/15/2019 for the symptoms described in the history of present illness. She was evaluated in the context of the global COVID-19 pandemic, which necessitated consideration that the patient  might be at risk for infection with the SARS-CoV-2 virus that causes COVID-19. Institutional protocols and algorithms that pertain to the evaluation of patients at risk for COVID-19 are in a state of rapid change based on information released by regulatory bodies including the CDC and federal and state organizations. These policies and algorithms were followed during the patient's care in the ED.  Final Clinical Impression(s) / ED Diagnoses Final diagnoses:  None    Rx / DC Orders ED Discharge Orders    None       Margarita Mail, PA-C 04/15/19 1519    Veryl Speak, MD 04/16/19 [phone]

## 2019-04-15 NOTE — ED Notes (Signed)
Daughter [phone]

## 2019-04-15 NOTE — ED Triage Notes (Signed)
Pt arrives via EMS from home with reports of AMS since yesterday. Baseline is A&Ox4, walks with walker. Pt answering all orientation questions, complaining of severe pain in abd.   HR 80-200 irregular 152 cbg

## 2019-04-15 NOTE — ED Notes (Signed)
Patient transported to CT 

## 2019-04-15 NOTE — H&P (Signed)
History and Physical    Betty Jackson [id number] DOB: 05-10-1934 DOA: 04/15/2019  PCP: Eulas Post, MD   Patient coming from: Home  I have personally briefly reviewed patient's old medical records in [place]  Chief Complaint: Back pain  HPI: Betty Jackson is a 85 y.o. female with medical history significant of CKD (stage 3), CAD, stroke, temporal arteritis, and RA on chronic steroid. She is also status post an L2-3 and L3-4 posterior fusion by Dr. Ellene Route in 2015 and an L5-S1 microdiscectomy by Dr. Ellene Route in 2017.  According to patient's daughter patient has had chronic ambulatory issue and been using a walker to ambulate.  There was a helper at home but this week the helper was out of town, so the daughter intalled an internal camera to monitor patient's status, and she found the patient has been limping around and toss and turning at night, also complaining about worsening of her back pain. When her daughter checked on her earlier today, the patient was very lethargic and somewhat confused. She had vomited in the bed and her daughter called EMS to have her brought to the hospital. Patient complains of back and hip pain. She denies numbness and tingling into her extremities. She has been febrile and has vomited.   ED Course: CT scan of her abdomen and pelvis revealed that her prior small nondisplaced fracture involving the left ischium now appears to be an aggressive lytic process with marked periostitis. There is also marked lucency of the L2, L3 and L4 vertebral bodies and possibly T12. These findings are new compared CAT scan done about 1 month ago, and are concerning for metastatic disease.    Review of Systems: Very lethargic unable to answer ROS   Past Medical History:  Diagnosis Date  . Abdominal pain, epigastric 06/22/2013  . Abdominal pain, other specified site 12/24/2011  . Abnormal urinalysis 05/20/2017  . Acute respiratory failure with hypoxia ([place]) 05/20/2017  .  Anemia, unspecified 10/28/2012  . Anxiety state, unspecified 10/28/2012  . Bruises easily    d/t being on prednisone and plavix  . CAD (coronary artery disease)    a. Stent RCA in [facility];  b. Cath approx 2009 - nonobs per pt report.  . Cataract    immature on the left eye  . Chronic insomnia 02/07/2013  . Chronic lower back pain    scoliosis  . CKD (chronic kidney disease) stage 3, GFR 30-59 ml/min   . Complication of anesthesia    pt has a very high tolerance to meds  . Coronary disease 05/20/2017  . CVA (cerebral infarction) 10/29/2012  . DDD (degenerative disc disease)   . Depression   . Diverticulitis    hx of  . Diverticulosis   . Elevated transaminase level 02/07/2013  . Enteritis   . GERD (gastroesophageal reflux disease) 09/01/2010  . Giant cell arteritis ([place])   . Hemorrhoids   . Herniated nucleus pulposus, L5-S1, right 11/04/2015  . History of scabies   . HTN (hypertension) 05/20/2017  . Hyperlipidemia    takes Lipitor daily  . Hypertension    takes Amlodipine,Losartan,Metoprolol,and HCTZ daily  . Incontinence of urine   . Influenza A 05/20/2017  . Insomnia    takes Restoril nightly  . Lumbar stenosis 04/25/2013  . Major depressive disorder, recurrent episode, moderate ([place]) 07/16/2013  . Migraines    "back in my 20's; none since" (10/28/2012)  . Osteoporosis   . PAF (paroxysmal atrial fibrillation) ([place])  2011   a. lone epidode in 2011 according to notes.  . Rheumatoid arthritis ([place])   . Scoliosis   . Sepsis ([place]) 05/20/2017  . Sinus bradycardia    a. on chronic bb  . Temporal arteritis ([place]) 2011   a. followed @ [facility]; potential flareup 10/28/2012/notes 10/28/2012  . Vocal cord dysfunction    "they don't operate properly" (10/28/2012)    Past Surgical History:  Procedure Laterality Date  . ABDOMINAL HYSTERECTOMY  ~ 1984   vaginally  . BACK SURGERY  7-81yrs ago   X Stop  . BLADDER SUSPENSION  2001  . BREAST BIOPSY Right   . CATARACT EXTRACTION W/ INTRAOCULAR  LENS IMPLANT Right ~ 08/2012  . CHEST TUBE INSERTION Left 03/08/2019   Procedure: Chest Tube Insertion;  Surgeon: Ivin Poot, MD;  Location: [place];  Service: Thoracic;  Laterality: Left;  . COLONOSCOPY  01/26/2012   Procedure: COLONOSCOPY;  Surgeon: Ladene Artist, MD,FACG;  Location[facility] ENDOSCOPY;  Service: Endoscopy;  Laterality: N/A;  note the EGD is possible  . CORONARY ANGIOPLASTY WITH STENT PLACEMENT  2006   X 1 stent  . EPIDURAL BLOCK INJECTION    . ESOPHAGOGASTRODUODENOSCOPY  01/26/2012   Procedure: ESOPHAGOGASTRODUODENOSCOPY (EGD);  Surgeon: Ladene Artist, MD,FACG;  Location: [facility] ENDOSCOPY;  Service: Endoscopy;  Laterality: N/A;  . HEMIARTHROPLASTY HIP Right 2012  . LAPAROSCOPIC CHOLECYSTECTOMY  2001  . LUMBAR FUSION  03/2013  . LUMBAR LAMINECTOMY/DECOMPRESSION MICRODISCECTOMY Right 11/04/2015   Procedure: Right Lumbar Five-Sacral One Microdiskectomy;  Surgeon: Kristeen Miss, MD;  Location: [place] NEURO ORS;  Service: Neurosurgery;  Laterality: Right;  Right L5-S1 Microdiskectomy  . moles removed that required stiches     one on leg and one on face  . SUBXYPHOID PERICARDIAL WINDOW N/A 03/08/2019   Procedure: SUBXYPHOID PERICARDIAL WINDOW;  Surgeon: Ivin Poot, MD;  Location: [place];  Service: Thoracic;  Laterality: N/A;  . TEE WITHOUT CARDIOVERSION N/A 03/08/2019   Procedure: TRANSESOPHAGEAL ECHOCARDIOGRAM (TEE);  Surgeon: Prescott Gum, Collier Salina, MD;  Location: [place];  Service: Thoracic;  Laterality: N/A;  . TEMPORAL ARTERY BIOPSY / LIGATION Bilateral 2011  . TONSILLECTOMY AND ADENOIDECTOMY     at age 48  . TUBAL LIGATION  ~ 1982  . X-STOP IMPLANTATION  ~ 2010   "lower back" (10/28/2012)     reports that she has never smoked. She has never used smokeless tobacco. She reports current alcohol use. She reports that she does not use drugs.  Allergies  Allergen Reactions  . Dextromethorphan Rash    Family History  Problem Relation Age of Onset  . Brain cancer Mother 70  . Heart  attack Father   . Breast cancer Daughter 98  . Heart disease Other   . Hypertension Other   . Arthritis Neg Hx   . Colon cancer Neg Hx   . Osteoporosis Neg Hx     Prior to Admission medications   Medication Sig Start Date End Date Taking? Authorizing Provider  amiodarone (PACERONE) 200 MG tablet Take 200 mg by mouth daily.   Yes [provider]  cholecalciferol (VITAMIN D) 1000 UNITS tablet Take 1,000 Units by mouth daily.    Yes [provider]  dicyclomine (BENTYL) 20 MG tablet Take 1 tablet (20 mg total) by mouth 2 (two) times daily as needed for spasms. 01/12/19  Yes Gareth Morgan, MD  diltiazem (CARDIZEM) 60 MG tablet Take 1 tablet (60 mg total) by mouth 3 (three) times daily. 01/31/19  Yes  Burnell Blanks, MD  ELIQUIS 5 MG TABS tablet TAKE 1 TABLET BY MOUTH TWICE A DAY Patient taking differently: Take 5 mg by mouth 2 (two) times daily.  11/15/18  Yes Burnell Blanks, MD  escitalopram (LEXAPRO) 10 MG tablet TAKE 1 TABLET BY MOUTH EVERY DAY Patient taking differently: Take 10 mg by mouth daily.  12/14/18  Yes Burchette, Alinda Sierras, MD  furosemide (LASIX) 20 MG tablet Take 2 tablets (40 mg total) by mouth 2 (two) times daily. 03/13/19 04/15/19 Yes Ikramullah, Mir Mohammed, MD  HYDROcodone-acetaminophen (NORCO/VICODIN) 5-325 MG tablet Take one tablet by mouth every 8 hours as needed for pain. May refill in two months. Patient taking differently: Take 1 tablet by mouth every 6 (six) hours as needed for moderate pain.  08/05/18  Yes Burchette, Alinda Sierras, MD  losartan (COZAAR) 100 MG tablet Take 100 mg by mouth daily.   Yes [provider]  metoprolol tartrate (LOPRESSOR) 100 MG tablet Take 1 tablet (100 mg total) by mouth 2 (two) times daily. 01/20/19  Yes Rai, Ripudeep K, MD  ondansetron (ZOFRAN ODT) 4 MG disintegrating tablet Take 1 tablet (4 mg total) by mouth every 8 (eight) hours as needed for nausea or vomiting. 01/12/19  Yes Gareth Morgan, MD   pantoprazole (PROTONIX) 40 MG tablet TAKE 1 TABLET BY MOUTH EVERY DAY Patient taking differently: Take 40 mg by mouth daily.  02/17/19  Yes Burchette, Alinda Sierras, MD  potassium chloride SA (KLOR-CON) 20 MEQ tablet Take 1 tablet (20 mEq total) by mouth daily. 01/20/19  Yes Rai, Ripudeep K, MD  predniSONE (DELTASONE) 10 MG tablet 4 tablets for 3 days, 3 tablets for 3 days, 2 tablets for 3 days, 1 tablet continue Patient taking differently: Take 10 mg by mouth daily.  05/26/17  Yes Reyne Dumas, MD  rosuvastatin (CRESTOR) 10 MG tablet Take 10 mg by mouth at bedtime. 03/05/19  Yes [provider]  vitamin C (ASCORBIC ACID) 500 MG tablet Take 1,000 mg by mouth daily.    Yes [provider]  vitamin E (VITAMIN E) 400 UNIT capsule Take 400 Units by mouth daily.   Yes [provider]  lidocaine (XYLOCAINE) 5 % ointment Apply 1 application topically 3 (three) times daily as needed. Patient not taking: Reported on 04/15/2019 04/13/19   Margette Fast, MD    Physical Exam: Vitals:   04/15/19 1630 04/15/19 1730 04/15/19 1827 04/15/19 1938  BP: 104/84 111/63 (!) 137/55   Pulse: (!) 121 (!) 118 (!) 117   Resp: (!) 37 (!) 30 (!) 22   Temp:   (!) 101.9 F (38.8 C) (P) 100.1 F (37.8 C)  TempSrc:   Oral (P) Oral  SpO2: 95% 91% 90%   Weight:      Height:        Constitutional: NAD, calm, comfortable Vitals:   04/15/19 1630 04/15/19 1730 04/15/19 1827 04/15/19 1938  BP: 104/84 111/63 (!) 137/55   Pulse: (!) 121 (!) 118 (!) 117   Resp: (!) 37 (!) 30 (!) 22   Temp:   (!) 101.9 F (38.8 C) (P) 100.1 F (37.8 C)  TempSrc:   Oral (P) Oral  SpO2: 95% 91% 90%   Weight:      Height:       Eyes: PERRL, lids and conjunctivae normal ENMT: Mucous membranes are dry. Posterior pharynx clear of any exudate or lesions.Normal dentition.  Neck: normal, supple, no masses, no thyromegaly Respiratory: clear to auscultation bilaterally, no wheezing,  no crackles. Normal respiratory effort.  No accessory muscle use.  Cardiovascular: Tachycardia, no murmurs / rubs / gallops. No extremity edema. 2+ pedal pulses. No carotid bruits.  Abdomen: no tenderness, no masses palpated. No hepatosplenomegaly. Bowel sounds positive.  Musculoskeletal: Unable to examine patient's back, patient has severe pain even with minimum rotating of her torso Skin: no rashes, lesions, ulcers. No induration Neurologic: Moving all her limbs.  Psychiatric: Very confused.    Labs on Admission: I have personally reviewed following labs and imaging studies  CBC: Recent Labs  Lab 04/15/19 1052 04/15/19 1148  WBC 15.9*  --   NEUTROABS 11.9*  --   HGB 12.8 13.6  HCT 41.1 40.0  MCV 92.8  --   PLT 204  --    Basic Metabolic Panel: Recent Labs  Lab 04/15/19 1052 04/15/19 1148  NA 140 139  K 2.7* 2.6*  CL 96* 101  CO2 29  --   GLUCOSE 136* 122*  BUN 11 10  CREATININE 1.01* 0.80  CALCIUM 8.9  --    GFR: Estimated Creatinine Clearance: 52.8 mL/min (by C-G formula based on SCr of 0.8 mg/dL). Liver Function Tests: Recent Labs  Lab 04/15/19 1052  AST 46*  ALT 36  ALKPHOS 101  BILITOT 2.5*  PROT 6.3*  ALBUMIN 3.3*   Recent Labs  Lab 04/15/19 1052  LIPASE 28   Recent Labs  Lab 04/15/19 1134  AMMONIA 28   Coagulation Profile: Recent Labs  Lab 04/15/19 1052  INR 1.3*   Cardiac Enzymes: No results for input(s): CKTOTAL, CKMB, CKMBINDEX, TROPONINI in the last 168 hours. BNP (last 3 results) No results for input(s): PROBNP in the last 8760 hours. HbA1C: No results for input(s): HGBA1C in the last 72 hours. CBG: No results for input(s): GLUCAP in the last 168 hours. Lipid Profile: No results for input(s): CHOL, HDL, LDLCALC, TRIG, CHOLHDL, LDLDIRECT in the last 72 hours. Thyroid Function Tests: No results for input(s): TSH, T4TOTAL, FREET4, T3FREE, THYROIDAB in the last 72 hours. Anemia Panel: No results for input(s): VITAMINB12, FOLATE, FERRITIN, TIBC, IRON, RETICCTPCT in the  last 72 hours. Urine analysis:    Component Value Date/Time   COLORURINE YELLOW 04/15/2019 1649   APPEARANCEUR CLEAR 04/15/2019 1649   LABSPEC >1.046 (H) 04/15/2019 1649   PHURINE 6.0 04/15/2019 1649   GLUCOSEU NEGATIVE 04/15/2019 1649   GLUCOSEU NEGATIVE 07/31/2016 1018   HGBUR MODERATE (A) 04/15/2019 1649   BILIRUBINUR NEGATIVE 04/15/2019 1649   BILIRUBINUR neg 07/31/2016 1012   KETONESUR NEGATIVE 04/15/2019 1649   PROTEINUR NEGATIVE 04/15/2019 1649   UROBILINOGEN 0.2 07/31/2016 1018   UROBILINOGEN 0.2 07/31/2016 1012   NITRITE NEGATIVE 04/15/2019 1649   LEUKOCYTESUR NEGATIVE 04/15/2019 1649    Radiological Exams on Admission: CT Angio Chest PE W and/or Wo Contrast  Result Date: 04/15/2019 CLINICAL DATA:  Shortness of breath.  Nausea and vomiting. EXAM: CT ANGIOGRAPHY CHEST CT ABDOMEN AND PELVIS WITH CONTRAST TECHNIQUE: Multidetector CT imaging of the chest was performed using the standard protocol during bolus administration of intravenous contrast. Multiplanar CT image reconstructions and MIPs were obtained to evaluate the vascular anatomy. Multidetector CT imaging of the abdomen and pelvis was performed using the standard protocol during bolus administration of intravenous contrast. CONTRAST:  142mL OMNIPAQUE IOHEXOL 350 MG/ML SOLN COMPARISON:  Abdominal CT scan 03/06/2019 the FINDINGS: CTA CHEST FINDINGS Cardiovascular: The heart is normal in size and stable. There is stable prominent pericardial fat and a small persistent pericardial effusion. The aorta is normal in  caliber. Scattered atherosclerotic calcifications. Stable coronary artery calcifications. The pulmonary arterial tree is well opacified. No filling defects to suggest pulmonary embolism. Mediastinum/Nodes: Stable scattered mediastinal and hilar lymph nodes and scattered calcified nodes. The esophagus is grossly normal. Lungs/Pleura: Mosaic pattern of ground-glass attenuation in the lungs diffusely likely due to small  airways disease such as asthma, constrictive, acute or respiratory bronchiolitis, hypersensitivity pneumonitis or cryptogenic organizing pneumonia. No focal infiltrates or worrisome pulmonary lesions. There are patchy areas of subsegmental atelectasis and a small right pleural effusion. Musculoskeletal: No breast masses, supraclavicular or axillary adenopathy. The bony thorax is intact. Vertebral augmentation changes are noted at T11. Review of the MIP images confirms the above findings. CT ABDOMEN and PELVIS FINDINGS Hepatobiliary: No focal hepatic lesions are identified. There is mild intra and extrahepatic biliary dilatation likely due to prior cholecystectomy. The portal and hepatic veins are patent. Pancreas: No mass, inflammation or ductal dilatation. Stable moderate atrophy. Spleen: Normal size.  Small calcified granulomas. Adrenals/Urinary Tract: The adrenal glands and kidneys are unremarkable. The bladder appears normal. Stomach/Bowel: The stomach, duodenum, small bowel and colon are grossly normal. No acute inflammatory changes, mass lesions or obstructive findings. The terminal ileum and appendix are normal. Descending colon and sigmoid colon diverticulosis but no findings for acute diverticulitis. Vascular/Lymphatic: Advanced atherosclerotic calcifications involving the aorta and iliac arteries. No aneurysm or dissection. The branch vessels are patent. The major venous structures are patent. No mesenteric or retroperitoneal mass or adenopathy. Small scattered lymph nodes are stable. Reproductive: Surgically absent. Other: No pelvic mass or free pelvic fluid collections. No inguinal mass or adenopathy. Musculoskeletal: A stable appearing right hip prosthesis. Advanced degenerative changes involving the left hip. Postoperative changes involving the spine with marked lytic appearance of L2, L3 and L4. T12 also appears somewhat expanded and lytic. There is also a aggressive lytic lesion involving the left  ischium and marked periostitis. Findings suspicious for osseous metastatic disease. Review of the MIP images confirms the above findings. IMPRESSION: 1. No CT findings for pulmonary embolism. 2. No aortic aneurysm or dissection. 3. Persistent small pericardial effusion and prominent pericardial fat. 4. Mosaic pattern of ground-glass attenuation throughout the lungs most likely small airways disease. Please see above discussion. 5. Small right pleural effusion and overlying atelectasis. 6. No acute abdominal/pelvic findings, mass lesions or adenopathy. 7. Prior small nondisplaced fracture involving the left ischium now appears to be an aggressive lytic process with marked periostitis. There is also marked lucency of the L2, L3 and L4 vertebral bodies and possibly T12. Could not exclude metastatic disease. Electronically Signed   By: Marijo Sanes M.D.   On: 04/15/2019 13:50   CT ABDOMEN PELVIS W CONTRAST  Result Date: 04/15/2019 CLINICAL DATA:  Shortness of breath.  Nausea and vomiting. EXAM: CT ANGIOGRAPHY CHEST CT ABDOMEN AND PELVIS WITH CONTRAST TECHNIQUE: Multidetector CT imaging of the chest was performed using the standard protocol during bolus administration of intravenous contrast. Multiplanar CT image reconstructions and MIPs were obtained to evaluate the vascular anatomy. Multidetector CT imaging of the abdomen and pelvis was performed using the standard protocol during bolus administration of intravenous contrast. CONTRAST:  1108mL OMNIPAQUE IOHEXOL 350 MG/ML SOLN COMPARISON:  Abdominal CT scan 03/06/2019 the FINDINGS: CTA CHEST FINDINGS Cardiovascular: The heart is normal in size and stable. There is stable prominent pericardial fat and a small persistent pericardial effusion. The aorta is normal in caliber. Scattered atherosclerotic calcifications. Stable coronary artery calcifications. The pulmonary arterial tree is well opacified. No filling defects to  suggest pulmonary embolism. Mediastinum/Nodes:  Stable scattered mediastinal and hilar lymph nodes and scattered calcified nodes. The esophagus is grossly normal. Lungs/Pleura: Mosaic pattern of ground-glass attenuation in the lungs diffusely likely due to small airways disease such as asthma, constrictive, acute or respiratory bronchiolitis, hypersensitivity pneumonitis or cryptogenic organizing pneumonia. No focal infiltrates or worrisome pulmonary lesions. There are patchy areas of subsegmental atelectasis and a small right pleural effusion. Musculoskeletal: No breast masses, supraclavicular or axillary adenopathy. The bony thorax is intact. Vertebral augmentation changes are noted at T11. Review of the MIP images confirms the above findings. CT ABDOMEN and PELVIS FINDINGS Hepatobiliary: No focal hepatic lesions are identified. There is mild intra and extrahepatic biliary dilatation likely due to prior cholecystectomy. The portal and hepatic veins are patent. Pancreas: No mass, inflammation or ductal dilatation. Stable moderate atrophy. Spleen: Normal size.  Small calcified granulomas. Adrenals/Urinary Tract: The adrenal glands and kidneys are unremarkable. The bladder appears normal. Stomach/Bowel: The stomach, duodenum, small bowel and colon are grossly normal. No acute inflammatory changes, mass lesions or obstructive findings. The terminal ileum and appendix are normal. Descending colon and sigmoid colon diverticulosis but no findings for acute diverticulitis. Vascular/Lymphatic: Advanced atherosclerotic calcifications involving the aorta and iliac arteries. No aneurysm or dissection. The branch vessels are patent. The major venous structures are patent. No mesenteric or retroperitoneal mass or adenopathy. Small scattered lymph nodes are stable. Reproductive: Surgically absent. Other: No pelvic mass or free pelvic fluid collections. No inguinal mass or adenopathy. Musculoskeletal: A stable appearing right hip prosthesis. Advanced degenerative changes  involving the left hip. Postoperative changes involving the spine with marked lytic appearance of L2, L3 and L4. T12 also appears somewhat expanded and lytic. There is also a aggressive lytic lesion involving the left ischium and marked periostitis. Findings suspicious for osseous metastatic disease. Review of the MIP images confirms the above findings. IMPRESSION: 1. No CT findings for pulmonary embolism. 2. No aortic aneurysm or dissection. 3. Persistent small pericardial effusion and prominent pericardial fat. 4. Mosaic pattern of ground-glass attenuation throughout the lungs most likely small airways disease. Please see above discussion. 5. Small right pleural effusion and overlying atelectasis. 6. No acute abdominal/pelvic findings, mass lesions or adenopathy. 7. Prior small nondisplaced fracture involving the left ischium now appears to be an aggressive lytic process with marked periostitis. There is also marked lucency of the L2, L3 and L4 vertebral bodies and possibly T12. Could not exclude metastatic disease. Electronically Signed   By: Marijo Sanes M.D.   On: 04/15/2019 13:50   DG Chest Port 1 View  Result Date: 04/15/2019 CLINICAL DATA:  Altered mental status. EXAM: PORTABLE CHEST 1 VIEW COMPARISON:  March 29, 2019 FINDINGS: Cardiomediastinal silhouette is enlarged. Mediastinal contours appear intact. There is no evidence of pleural effusion or pneumothorax. Bilateral lower lobe atelectasis versus mild peribronchial airspace consolidation. Osseous structures are without acute abnormality. Soft tissues are grossly normal. IMPRESSION: 1. Bilateral lower lobe atelectasis versus mild peribronchial airspace consolidation. 2. Enlarged cardiac silhouette. Electronically Signed   By: Fidela Salisbury M.D.   On: 04/15/2019 11:40   US Abdomen Limited RUQ  Result Date: 04/15/2019 CLINICAL DATA:  Elevated liver function tests. EXAM: ULTRASOUND ABDOMEN LIMITED RIGHT UPPER QUADRANT COMPARISON:  CT of the  abdomen April 15, 2019 FINDINGS: Gallbladder: Post cholecystectomy.  No focal fluid collection. Common bile duct: Diameter: 6.6 mm Liver: No focal lesion identified. Within normal limits in parenchymal echogenicity. Portal vein is patent on color Doppler imaging with normal direction of  blood flow towards the liver. Other: None. IMPRESSION: Normal right upper quadrant ultrasound post cholecystectomy. Electronically Signed   By: Fidela Salisbury M.D.   On: 04/15/2019 16:00    EKG: Independently reviewed.   Assessment/Plan Active Problems:   Sepsis ([place])  Sepsis, discussed with ED physician, and has high white count, severe back pain, T showed new lesions on her lumbar vertebral bodies from L2-L4, but CT cannot further characterize malignancy versus infections.  Discussed with neurosurgery on-call physician, commend MRI with contrast to further characterize the lumbar vertebral lesions.  Start patient on broad-spectrum antibiotics in the ED. Her urine is clean, CTA chest showed no clear infiltrates.  Metabolic encephalopathy, likely from sepsis, treated sepsis first and then reevaluate.  Chronic A. Fib, will hold her Eliquis for possible IR/neurosurgery intervention.  Rheumatoid arthritis plus giant cell arteritis, will switch her steroid to stressing dose until sepsis improved.  CKD stage II, no acute issue on IV fluid.  Chronic hypokalemia, place and recheck, check mag and [place].    DVT prophylaxis: Heparin subcu Code Status: DNR Family Communication: Daughter Melissa over the phone all questions answered at my best knowledge Disposition Plan: Pending on clinical progress Consults called: Neurosurgery Admission status: PCU   Lequita Halt MD [facility] Pager [phone]  If 7PM-7AM, please contact night-coverage [email or web address] Password [facility]  04/15/2019, 7:42 PM

## 2019-04-15 NOTE — Progress Notes (Signed)
Pharmacy Antibiotic Note  Betty Jackson is a 85 y.o. female admitted on 04/15/2019 with sepsis.  Pharmacy has been consulted for Cefepime and Vancomycin dosing.  Height: 5' 4.5" (163.8 cm) Weight: 155 lb (70.3 kg) IBW/kg (Calculated) : 55.85  Temp (24hrs), Avg:99 F (37.2 C), Min:99 F (37.2 C), Max:99 F (37.2 C)  Recent Labs  Lab 04/15/19 1052 04/15/19 1148 04/15/19 1332  WBC 15.9*  --   --   CREATININE 1.01* 0.80  --   LATICACIDVEN 2.4*  --  2.6*    Estimated Creatinine Clearance: 52.8 mL/min (by C-G formula based on SCr of 0.8 mg/dL).    Allergies  Allergen Reactions  . Dextromethorphan Rash    Antimicrobials this admission: 1/16 Cefepime >>  1/16 Vancomycin >>   Dose adjustments this admission:   Microbiology results: 1/16 BCx: Pending  Plan: - Cefepime 2g IV q12h  - Vancomycin 1500mg  IV x 1 dose  - Followed by Vancomycin 1000mg  IV q24h - Est Calc AUC 448 - Monitor patients renal function   Thank you for allowing pharmacy to be a part of this patient's care.  Duanne Limerick 04/15/2019 2:23 PM

## 2019-04-15 NOTE — Consult Note (Signed)
Providing Compassionate, Quality Care - Together   Reason for Consult: Back pain Referring Physician: Dr. Clent Ridges is an 85 y.o. female.  HPI: Patient with a history significant for CKD (stage 3), CAD, stroke, temporal arteritis, and RA. She is also status post an L2-3 and L3-4 posterior fusion by Dr. Ellene Route in 2015 and an L5-S1 microdiscectomy by Dr. Ellene Route in 2017. The rest of her history is outlined below. She presented to the ED earlier today following a mechanical fall at home yesterday. She reports pain in her right hip, but has been ambulatory with her walker since her fall. When her daughter checked on her earlier today, the patient was very lethargic and somewhat confused. She had vomited in the bed and her daughter called EMS to have her brought to the hospital. Patient complains of back and hip pain. She denies numbness and tingling into her extremities. She has been febrile and has vomited. CT scan of her abdomen and pelvis revealed that her prior small nondisplaced fracture involving the left ischium now appears to be an aggressive lytic process with marked periostitis. There is also marked lucency of the L2, L3 and L4 vertebral bodies and possibly T12. These findings are concerning for metastatic disease. Following these findings, Neurosurgery was consulted for further evaluation and recommendations.  Past Medical History:  Diagnosis Date  . Abdominal pain, epigastric 06/22/2013  . Abdominal pain, other specified site 12/24/2011  . Abnormal urinalysis 05/20/2017  . Acute respiratory failure with hypoxia ([place]) 05/20/2017  . Anemia, unspecified 10/28/2012  . Anxiety state, unspecified 10/28/2012  . Bruises easily    d/t being on prednisone and plavix  . CAD (coronary artery disease)    a. Stent RCA in [facility];  b. Cath approx 2009 - nonobs per pt report.  . Cataract    immature on the left eye  . Chronic insomnia 02/07/2013  . Chronic lower back pain    scoliosis   . CKD (chronic kidney disease) stage 3, GFR 30-59 ml/min   . Complication of anesthesia    pt has a very high tolerance to meds  . Coronary disease 05/20/2017  . CVA (cerebral infarction) 10/29/2012  . DDD (degenerative disc disease)   . Depression   . Diverticulitis    hx of  . Diverticulosis   . Elevated transaminase level 02/07/2013  . Enteritis   . GERD (gastroesophageal reflux disease) 09/01/2010  . Giant cell arteritis ([place])   . Hemorrhoids   . Herniated nucleus pulposus, L5-S1, right 11/04/2015  . History of scabies   . HTN (hypertension) 05/20/2017  . Hyperlipidemia    takes Lipitor daily  . Hypertension    takes Amlodipine,Losartan,Metoprolol,and HCTZ daily  . Incontinence of urine   . Influenza A 05/20/2017  . Insomnia    takes Restoril nightly  . Lumbar stenosis 04/25/2013  . Major depressive disorder, recurrent episode, moderate ([place]) 07/16/2013  . Migraines    "back in my 20's; none since" (10/28/2012)  . Osteoporosis   . PAF (paroxysmal atrial fibrillation) ([place]) 2011   a. lone epidode in 2011 according to notes.  . Rheumatoid arthritis ([place])   . Scoliosis   . Sepsis ([place]) 05/20/2017  . Sinus bradycardia    a. on chronic bb  . Temporal arteritis ([place]) 2011   a. followed @ [facility]; potential flareup 10/28/2012/notes 10/28/2012  . Vocal cord dysfunction    "they don't operate properly" (10/28/2012)    Past Surgical History:  Procedure  Laterality Date  . ABDOMINAL HYSTERECTOMY  ~ 1984   vaginally  . BACK SURGERY  7-30yr ago   X Stop  . BLADDER SUSPENSION  2001  . BREAST BIOPSY Right   . CATARACT EXTRACTION W/ INTRAOCULAR LENS IMPLANT Right ~ 08/2012  . CHEST TUBE INSERTION Left 03/08/2019   Procedure: Chest Tube Insertion;  Surgeon: VIvin Poot MD;  [facility]  Service: Thoracic;  Laterality: Left;  . COLONOSCOPY  01/26/2012   Procedure: COLONOSCOPY;  Surgeon: MLadene Artist MD,FACG;  [facility]  Service: Endoscopy;  Laterality: N/A;  note the  EGD is possible  . CORONARY ANGIOPLASTY WITH STENT PLACEMENT  2006   X 1 stent  . EPIDURAL BLOCK INJECTION    . ESOPHAGOGASTRODUODENOSCOPY  01/26/2012   Procedure: ESOPHAGOGASTRODUODENOSCOPY (EGD);  Surgeon: MLadene Artist MD,FACG;  [facility];  Service: Endoscopy;  Laterality: N/A;  . HEMIARTHROPLASTY HIP Right 2012  . LAPAROSCOPIC CHOLECYSTECTOMY  2001  . LUMBAR FUSION  03/2013  . LUMBAR LAMINECTOMY/DECOMPRESSION MICRODISCECTOMY Right 11/04/2015   Procedure: Right Lumbar Five-Sacral One Microdiskectomy;  Surgeon: HKristeen Miss MD;  [facility];  Service: Neurosurgery;  Laterality: Right;  Right L5-S1 Microdiskectomy  . moles removed that required stiches     one on leg and one on face  . SUBXYPHOID PERICARDIAL WINDOW N/A 03/08/2019   Procedure: SUBXYPHOID PERICARDIAL WINDOW;  Surgeon: VIvin Poot MD;  [place]  Service: Thoracic;  Laterality: N/A;  . TEE WITHOUT CARDIOVERSION N/A 03/08/2019   Procedure: TRANSESOPHAGEAL ECHOCARDIOGRAM (TEE);  Surgeon: VPrescott Gum PCollier Salina MD;  [facility]  Service: Thoracic;  Laterality: N/A;  . TEMPORAL ARTERY BIOPSY / LIGATION Bilateral 2011  . TONSILLECTOMY AND ADENOIDECTOMY     at age 85 . TUBAL LIGATION  ~ 1982  . X-STOP IMPLANTATION  ~ 2010   "lower back" (10/28/2012)    Family History  Problem Relation Age of Onset  . Brain cancer Mother 565 . Heart attack Father   . Breast cancer Daughter 45 . Heart disease Other   . Hypertension Other   . Arthritis Neg Hx   . Colon cancer Neg Hx   . Osteoporosis Neg Hx     Social History:  reports that she has never smoked. She has never used smokeless tobacco. She reports current alcohol use. She reports that she does not use drugs.  Allergies:  Allergies  Allergen Reactions  . Dextromethorphan Rash    Medications: I have reviewed the patient's current medications.  Results for orders placed or performed during the hospital encounter of 04/15/19 (from the  past 48 hour(s))  Lactic acid, plasma     Status: Abnormal   Collection Time: 04/15/19 10:52 AM  Result Value Ref Range   Lactic Acid, Venous 2.4 (HH) 0.5 - 1.9 mmol/L    Comment: CRITICAL RESULT CALLED TO, READ BACK BY AND VERIFIED WITH: M.BARBER RN @ 1211 04/15/2019 BY C.EDENS Performed at [facility] [address] [place] [address]  Comprehensive metabolic panel     Status: Abnormal   Collection Time: 04/15/19 10:52 AM  Result Value Ref Range   Sodium 140 135 - 145 mmol/L   Potassium 2.7 (LL) 3.5 - 5.1 mmol/L    Comment: CRITICAL RESULT CALLED TO, READ BACK BY AND VERIFIED WITH: M.BARBER RN @ 1211 04/15/2019 BY C.EDENS    Chloride 96 (L) 98 - 111 mmol/L   CO2 29 22 - 32 mmol/L  Glucose, Bld 136 (H) 70 - 99 mg/dL   BUN 11 8 - 23 mg/dL   Creatinine, Ser 1.01 (H) 0.44 - 1.00 mg/dL   Calcium 8.9 8.9 - 10.3 mg/dL   Total Protein 6.3 (L) 6.5 - 8.1 g/dL   Albumin 3.3 (L) 3.5 - 5.0 g/dL   AST 46 (H) 15 - 41 U/L   ALT 36 0 - 44 U/L   Alkaline Phosphatase 101 38 - 126 U/L   Total Bilirubin 2.5 (H) 0.3 - 1.2 mg/dL   GFR calc non Af Amer 52 (L) >60 mL/min   GFR calc Af Amer >60 >60 mL/min   Anion gap 15 5 - 15    Comment: Performed at [facility] [address] [place], [place] [address]  CBC WITH DIFFERENTIAL     Status: Abnormal   Collection Time: 04/15/19 10:52 AM  Result Value Ref Range   WBC 15.9 (H) 4.0 - 10.5 K/uL   RBC 4.43 3.87 - 5.11 MIL/uL   Hemoglobin 12.8 12.0 - 15.0 g/dL   HCT 41.1 36.0 - 46.0 %   MCV 92.8 80.0 - 100.0 fL   MCH 28.9 26.0 - 34.0 pg   MCHC 31.1 30.0 - 36.0 g/dL   RDW 15.5 11.5 - 15.5 %   Platelets 204 150 - 400 K/uL   nRBC 0.0 0.0 - 0.2 %   Neutrophils Relative % 74 %   Neutro Abs 11.9 (H) 1.7 - 7.7 K/uL   Lymphocytes Relative 14 %   Lymphs Abs 2.2 0.7 - 4.0 K/uL   Monocytes Relative 11 %   Monocytes Absolute 1.7 (H) 0.1 - 1.0 K/uL   Eosinophils Relative 0 %   Eosinophils Absolute 0.0 0.0 - 0.5 K/uL   Basophils  Relative 0 %   Basophils Absolute 0.1 0.0 - 0.1 K/uL   Immature Granulocytes 1 %   Abs Immature Granulocytes 0.08 (H) 0.00 - 0.07 K/uL    Comment: Performed at [facility], [address], [place], [place] [address]  APTT     Status: None   Collection Time: 04/15/19 10:52 AM  Result Value Ref Range   aPTT 34 24 - 36 seconds    Comment: Performed at [facility] [address] [place], [place] [address]  Protime-INR     Status: Abnormal   Collection Time: 04/15/19 10:52 AM  Result Value Ref Range   Prothrombin Time 16.5 (H) 11.4 - 15.2 seconds   INR 1.3 (H) 0.8 - 1.2    Comment: (NOTE) INR goal varies based on device and disease states. Performed at [facility], [place] [address] [place], [place] [address]   Lipase, blood     Status: None   Collection Time: 04/15/19 10:52 AM  Result Value Ref Range   Lipase 28 11 - 51 U/L    Comment: Performed at [facility], [place] [address] [place] [address], [place], [place] [address]  Troponin I (High Sensitivity)     Status: Abnormal   Collection Time: 04/15/19 10:52 AM  Result Value Ref Range   Troponin I (High Sensitivity) 88 (H) <18 ng/L    Comment: (NOTE) Elevated high sensitivity troponin I (hsTnI) values and significant  changes across serial measurements may suggest ACS but many other  chronic and acute conditions are known to elevate hsTnI results.  Refer to the Links section for chest pain algorithms and additional  guidance. Performed at [facility], [place] [address],  [place], [place] [address]   Brain natriuretic peptide     Status: Abnormal   Collection Time: 04/15/19 10:52 AM  Result Value Ref Range   B Natriuretic Peptide 170.2 (H) 0.0 - 100.0 pg/mL    Comment: Performed at [facility] [address], [place], [place] [address]  Ammonia     Status: None   Collection Time: 04/15/19 11:34 AM  Result Value Ref Range   Ammonia 28 9 - 35 umol/L    Comment: Performed at [facility], [place]  [address] [place], [place] [address]  I-Stat Chem 8, ED     Status: Abnormal   Collection Time: 04/15/19 11:48 AM  Result Value Ref Range   Sodium 139 135 - 145 mmol/L   Potassium 2.6 (LL) 3.5 - 5.1 mmol/L   Chloride 101 98 - 111 mmol/L   BUN 10 8 - 23 mg/dL   Creatinine, Ser 0.80 0.44 - 1.00 mg/dL   Glucose, Bld 122 (H) 70 - 99 mg/dL   Calcium, Ion 0.94 (L) 1.15 - 1.40 mmol/L   TCO2 28 22 - 32 mmol/L   Hemoglobin 13.6 12.0 - 15.0 g/dL   HCT 40.0 36.0 - 46.0 %   Comment NOTIFIED PHYSICIAN   POC SARS Coronavirus 2 Ag-ED - Nasal Swab (BD Veritor Kit)     Status: None   Collection Time: 04/15/19 11:59 AM  Result Value Ref Range   SARS Coronavirus 2 Ag NEGATIVE NEGATIVE    Comment: (NOTE) SARS-CoV-2 antigen NOT DETECTED.  Negative results are presumptive.  Negative results do not preclude SARS-CoV-2 infection and should not be used as the sole basis for treatment or other patient management decisions, including infection  control decisions, particularly in the presence of clinical signs and  symptoms consistent with COVID-19, or in those who have been in contact with the virus.  Negative results must be combined with clinical observations, patient history, and epidemiological information. The expected result is Negative. Fact Sheet for Patients: PodPark.tn Fact Sheet for Healthcare Providers: GiftContent.is This test is not yet approved or cleared by the [place] FDA and  has been authorized for detection and/or diagnosis of SARS-CoV-2 by FDA under an Emergency Use Authorization (EUA).  This EUA will remain in effect (meaning this test can be used) for the duration of  the COVID-19 de claration under Section 564(b)(1) of the Act, 21 U.S.C. section 360bbb-3(b)(1), unless the authorization is terminated or revoked sooner.   Troponin I (High Sensitivity)     Status: Abnormal   Collection Time: 04/15/19  1:31 PM  Result Value  Ref Range   Troponin I (High Sensitivity) 174 (HH) <18 ng/L    Comment: CRITICAL RESULT CALLED TO, READ BACK BY AND VERIFIED WITH: Jene Every RN AT 6213 04/15/19 BY [place] (NOTE) Elevated high sensitivity troponin I (hsTnI) values and significant  changes across serial measurements may suggest ACS but many other  chronic and acute conditions are known to elevate hsTnI results.  Refer to the Links section for chest pain algorithms and additional  guidance. Performed at [facility], [place] [address], [place], [place] [address]   Lactic acid, plasma     Status: Abnormal   Collection Time: 04/15/19  1:32 PM  Result Value Ref Range   Lactic Acid, Venous 2.6 (HH) 0.5 - 1.9 mmol/L    Comment: CRITICAL VALUE NOTED.  VALUE IS CONSISTENT WITH PREVIOUSLY REPORTED AND CALLED VALUE. Performed at [facility], [place]  [address], [place], [place] [address]   Bilirubin, direct     Status: Abnormal   Collection Time: 04/15/19  3:58 PM  Result Value Ref Range   Bilirubin, Direct 0.6 (H) 0.0 - 0.2 mg/dL    Comment: Performed at [facility] [address], [place], [place] [address]  Urinalysis, Routine w reflex microscopic     Status: Abnormal   Collection Time: 04/15/19  4:49 PM  Result Value Ref Range   Color, Urine YELLOW YELLOW   APPearance CLEAR CLEAR   Specific Gravity, Urine >1.046 (H) 1.005 - 1.030   pH 6.0 5.0 - 8.0   Glucose, UA NEGATIVE NEGATIVE mg/dL   Hgb urine dipstick MODERATE (A) NEGATIVE   Bilirubin Urine NEGATIVE NEGATIVE   Ketones, ur NEGATIVE NEGATIVE mg/dL   Protein, ur NEGATIVE NEGATIVE mg/dL   Nitrite NEGATIVE NEGATIVE   Leukocytes,Ua NEGATIVE NEGATIVE   RBC / HPF 0-5 0 - 5 RBC/hpf   WBC, UA 0-5 0 - 5 WBC/hpf   Bacteria, UA RARE (A) NONE SEEN    Comment: Performed at [facility] [address], [place], [place] [address]  Sedimentation rate     Status: Abnormal   Collection Time: 04/15/19  4:50 PM  Result Value Ref Range   Sed Rate 25 (H)  0 - 22 mm/hr    Comment: Performed at [facility] [address], [place], [place] [address]  C-reactive protein     Status: Abnormal   Collection Time: 04/15/19  4:50 PM  Result Value Ref Range   CRP 17.4 (H) <1.0 mg/dL    Comment: Performed at [facility], [place] [address] [place], [place] [address]    CT Angio Chest PE W and/or Wo Contrast  Result Date: 04/15/2019 CLINICAL DATA:  Shortness of breath.  Nausea and vomiting. EXAM: CT ANGIOGRAPHY CHEST CT ABDOMEN AND PELVIS WITH CONTRAST TECHNIQUE: Multidetector CT imaging of the chest was performed using the standard protocol during bolus administration of intravenous contrast. Multiplanar CT image reconstructions and MIPs were obtained to evaluate the vascular anatomy. Multidetector CT imaging of the abdomen and pelvis was performed using the standard protocol during bolus administration of intravenous contrast. CONTRAST:  178m OMNIPAQUE IOHEXOL 350 MG/ML SOLN COMPARISON:  Abdominal CT scan 03/06/2019 the FINDINGS: CTA CHEST FINDINGS Cardiovascular: The heart is normal in size and stable. There is stable prominent pericardial fat and a small persistent pericardial effusion. The aorta is normal in caliber. Scattered atherosclerotic calcifications. Stable coronary artery calcifications. The pulmonary arterial tree is well opacified. No filling defects to suggest pulmonary embolism. Mediastinum/Nodes: Stable scattered mediastinal and hilar lymph nodes and scattered calcified nodes. The esophagus is grossly normal. Lungs/Pleura: Mosaic pattern of ground-glass attenuation in the lungs diffusely likely due to small airways disease such as asthma, constrictive, acute or respiratory bronchiolitis, hypersensitivity pneumonitis or cryptogenic organizing pneumonia. No focal infiltrates or worrisome pulmonary lesions. There are patchy areas of subsegmental atelectasis and a small right pleural effusion. Musculoskeletal: No breast masses,  supraclavicular or axillary adenopathy. The bony thorax is intact. Vertebral augmentation changes are noted at T11. Review of the MIP images confirms the above findings. CT ABDOMEN and PELVIS FINDINGS Hepatobiliary: No focal hepatic lesions are identified. There is mild intra and extrahepatic biliary dilatation likely due to prior cholecystectomy. The portal and hepatic veins are patent. Pancreas: No mass, inflammation or ductal dilatation. Stable moderate atrophy. Spleen: Normal size.  Small calcified granulomas. Adrenals/Urinary Tract: The adrenal glands and kidneys are  unremarkable. The bladder appears normal. Stomach/Bowel: The stomach, duodenum, small bowel and colon are grossly normal. No acute inflammatory changes, mass lesions or obstructive findings. The terminal ileum and appendix are normal. Descending colon and sigmoid colon diverticulosis but no findings for acute diverticulitis. Vascular/Lymphatic: Advanced atherosclerotic calcifications involving the aorta and iliac arteries. No aneurysm or dissection. The branch vessels are patent. The major venous structures are patent. No mesenteric or retroperitoneal mass or adenopathy. Small scattered lymph nodes are stable. Reproductive: Surgically absent. Other: No pelvic mass or free pelvic fluid collections. No inguinal mass or adenopathy. Musculoskeletal: A stable appearing right hip prosthesis. Advanced degenerative changes involving the left hip. Postoperative changes involving the spine with marked lytic appearance of L2, L3 and L4. T12 also appears somewhat expanded and lytic. There is also a aggressive lytic lesion involving the left ischium and marked periostitis. Findings suspicious for osseous metastatic disease. Review of the MIP images confirms the above findings. IMPRESSION: 1. No CT findings for pulmonary embolism. 2. No aortic aneurysm or dissection. 3. Persistent small pericardial effusion and prominent pericardial fat. 4. Mosaic pattern of  ground-glass attenuation throughout the lungs most likely small airways disease. Please see above discussion. 5. Small right pleural effusion and overlying atelectasis. 6. No acute abdominal/pelvic findings, mass lesions or adenopathy. 7. Prior small nondisplaced fracture involving the left ischium now appears to be an aggressive lytic process with marked periostitis. There is also marked lucency of the L2, L3 and L4 vertebral bodies and possibly T12. Could not exclude metastatic disease. Electronically Signed   By: Marijo Sanes M.D.   On: 04/15/2019 13:50   CT ABDOMEN PELVIS W CONTRAST  Result Date: 04/15/2019 CLINICAL DATA:  Shortness of breath.  Nausea and vomiting. EXAM: CT ANGIOGRAPHY CHEST CT ABDOMEN AND PELVIS WITH CONTRAST TECHNIQUE: Multidetector CT imaging of the chest was performed using the standard protocol during bolus administration of intravenous contrast. Multiplanar CT image reconstructions and MIPs were obtained to evaluate the vascular anatomy. Multidetector CT imaging of the abdomen and pelvis was performed using the standard protocol during bolus administration of intravenous contrast. CONTRAST:  118m OMNIPAQUE IOHEXOL 350 MG/ML SOLN COMPARISON:  Abdominal CT scan 03/06/2019 the FINDINGS: CTA CHEST FINDINGS Cardiovascular: The heart is normal in size and stable. There is stable prominent pericardial fat and a small persistent pericardial effusion. The aorta is normal in caliber. Scattered atherosclerotic calcifications. Stable coronary artery calcifications. The pulmonary arterial tree is well opacified. No filling defects to suggest pulmonary embolism. Mediastinum/Nodes: Stable scattered mediastinal and hilar lymph nodes and scattered calcified nodes. The esophagus is grossly normal. Lungs/Pleura: Mosaic pattern of ground-glass attenuation in the lungs diffusely likely due to small airways disease such as asthma, constrictive, acute or respiratory bronchiolitis, hypersensitivity  pneumonitis or cryptogenic organizing pneumonia. No focal infiltrates or worrisome pulmonary lesions. There are patchy areas of subsegmental atelectasis and a small right pleural effusion. Musculoskeletal: No breast masses, supraclavicular or axillary adenopathy. The bony thorax is intact. Vertebral augmentation changes are noted at T11. Review of the MIP images confirms the above findings. CT ABDOMEN and PELVIS FINDINGS Hepatobiliary: No focal hepatic lesions are identified. There is mild intra and extrahepatic biliary dilatation likely due to prior cholecystectomy. The portal and hepatic veins are patent. Pancreas: No mass, inflammation or ductal dilatation. Stable moderate atrophy. Spleen: Normal size.  Small calcified granulomas. Adrenals/Urinary Tract: The adrenal glands and kidneys are unremarkable. The bladder appears normal. Stomach/Bowel: The stomach, duodenum, small bowel and colon are grossly normal. No acute inflammatory  changes, mass lesions or obstructive findings. The terminal ileum and appendix are normal. Descending colon and sigmoid colon diverticulosis but no findings for acute diverticulitis. Vascular/Lymphatic: Advanced atherosclerotic calcifications involving the aorta and iliac arteries. No aneurysm or dissection. The branch vessels are patent. The major venous structures are patent. No mesenteric or retroperitoneal mass or adenopathy. Small scattered lymph nodes are stable. Reproductive: Surgically absent. Other: No pelvic mass or free pelvic fluid collections. No inguinal mass or adenopathy. Musculoskeletal: A stable appearing right hip prosthesis. Advanced degenerative changes involving the left hip. Postoperative changes involving the spine with marked lytic appearance of L2, L3 and L4. T12 also appears somewhat expanded and lytic. There is also a aggressive lytic lesion involving the left ischium and marked periostitis. Findings suspicious for osseous metastatic disease. Review of the MIP  images confirms the above findings. IMPRESSION: 1. No CT findings for pulmonary embolism. 2. No aortic aneurysm or dissection. 3. Persistent small pericardial effusion and prominent pericardial fat. 4. Mosaic pattern of ground-glass attenuation throughout the lungs most likely small airways disease. Please see above discussion. 5. Small right pleural effusion and overlying atelectasis. 6. No acute abdominal/pelvic findings, mass lesions or adenopathy. 7. Prior small nondisplaced fracture involving the left ischium now appears to be an aggressive lytic process with marked periostitis. There is also marked lucency of the L2, L3 and L4 vertebral bodies and possibly T12. Could not exclude metastatic disease. Electronically Signed   By: Marijo Sanes M.D.   On: 04/15/2019 13:50   DG Chest Port 1 View  Result Date: 04/15/2019 CLINICAL DATA:  Altered mental status. EXAM: PORTABLE CHEST 1 VIEW COMPARISON:  March 29, 2019 FINDINGS: Cardiomediastinal silhouette is enlarged. Mediastinal contours appear intact. There is no evidence of pleural effusion or pneumothorax. Bilateral lower lobe atelectasis versus mild peribronchial airspace consolidation. Osseous structures are without acute abnormality. Soft tissues are grossly normal. IMPRESSION: 1. Bilateral lower lobe atelectasis versus mild peribronchial airspace consolidation. 2. Enlarged cardiac silhouette. Electronically Signed   By: Fidela Salisbury M.D.   On: 04/15/2019 11:40   US Abdomen Limited RUQ  Result Date: 04/15/2019 CLINICAL DATA:  Elevated liver function tests. EXAM: ULTRASOUND ABDOMEN LIMITED RIGHT UPPER QUADRANT COMPARISON:  CT of the abdomen April 15, 2019 FINDINGS: Gallbladder: Post cholecystectomy.  No focal fluid collection. Common bile duct: Diameter: 6.6 mm Liver: No focal lesion identified. Within normal limits in parenchymal echogenicity. Portal vein is patent on color Doppler imaging with normal direction of blood flow towards the liver.  Other: None. IMPRESSION: Normal right upper quadrant ultrasound post cholecystectomy. Electronically Signed   By: Fidela Salisbury M.D.   On: 04/15/2019 16:00    The patient is with delayed responses and still appears somewhat confused. She was able to tell me her back and hip hurts, but unable to perform further review of systems aside from information gathered from nurse and chart.  Blood pressure 111/63, pulse (!) 118, temperature 99 F (37.2 C), temperature source Oral, resp. rate (!) 30, height 5' 4.5" (1.638 m), weight 70.3 kg, SpO2 91 %. Physical Exam  Constitutional: She is oriented to person, place, and time. She appears well-developed and well-nourished. She appears lethargic. She has a sickly appearance.  HENT:  Head: Normocephalic and atraumatic.  Eyes: Pupils are equal, round, and reactive to light. Conjunctivae and EOM are normal.  Cardiovascular: Tachycardia present.  Respiratory: Effort normal.  GI: Soft. She exhibits no distension.  Musculoskeletal:        General: Tenderness present. Normal range  of motion.     Cervical back: Normal range of motion and neck supple.  Neurological: She is oriented to person, place, and time. She has normal strength. She appears lethargic. No cranial nerve deficit or sensory deficit. GCS eye subscore is 4. GCS verbal subscore is 4. GCS motor subscore is 6.  Skin: Skin is warm and dry.  Psychiatric: Her behavior is normal. Her affect is blunt. Her speech is delayed and slurred.    Assessment/Plan: Patient with CT findings concerning for metastatic disease to the patient's spine. Further work up with a thoracic and lumbar MRI with and without contrast have been ordered. Neurosurgery will follow for now. Dr. Ellene Route will assume patient's care on Monday.   Patricia Nettle 04/15/2019, 6:04 PM

## 2019-04-16 ENCOUNTER — Inpatient Hospital Stay (HOSPITAL_COMMUNITY): Payer: Medicare Other

## 2019-04-16 ENCOUNTER — Encounter (HOSPITAL_COMMUNITY): Payer: Self-pay

## 2019-04-16 DIAGNOSIS — A419 Sepsis, unspecified organism: Principal | ICD-10-CM

## 2019-04-16 LAB — CBC WITH DIFFERENTIAL/PLATELET
Abs Immature Granulocytes: 0.05 10*3/uL (ref 0.00–0.07)
Basophils Absolute: 0 10*3/uL (ref 0.0–0.1)
Basophils Relative: 0 %
Eosinophils Absolute: 0 10*3/uL (ref 0.0–0.5)
Eosinophils Relative: 0 %
HCT: 24.3 % — ABNORMAL LOW (ref 36.0–46.0)
Hemoglobin: 7.4 g/dL — ABNORMAL LOW (ref 12.0–15.0)
Immature Granulocytes: 1 %
Lymphocytes Relative: 12 %
Lymphs Abs: 1.1 10*3/uL (ref 0.7–4.0)
MCH: 29.6 pg (ref 26.0–34.0)
MCHC: 30.5 g/dL (ref 30.0–36.0)
MCV: 97.2 fL (ref 80.0–100.0)
Monocytes Absolute: 1.1 10*3/uL — ABNORMAL HIGH (ref 0.1–1.0)
Monocytes Relative: 12 %
Neutro Abs: 6.7 10*3/uL (ref 1.7–7.7)
Neutrophils Relative %: 75 %
Platelets: 112 10*3/uL — ABNORMAL LOW (ref 150–400)
RBC: 2.5 MIL/uL — ABNORMAL LOW (ref 3.87–5.11)
RDW: 15.8 % — ABNORMAL HIGH (ref 11.5–15.5)
WBC: 8.9 10*3/uL (ref 4.0–10.5)
nRBC: 0 % (ref 0.0–0.2)

## 2019-04-16 LAB — CBC
HCT: 33.5 % — ABNORMAL LOW (ref 36.0–46.0)
Hemoglobin: 10.6 g/dL — ABNORMAL LOW (ref 12.0–15.0)
MCH: 30.2 pg (ref 26.0–34.0)
MCHC: 31.6 g/dL (ref 30.0–36.0)
MCV: 95.4 fL (ref 80.0–100.0)
Platelets: 146 10*3/uL — ABNORMAL LOW (ref 150–400)
RBC: 3.51 MIL/uL — ABNORMAL LOW (ref 3.87–5.11)
RDW: 15.9 % — ABNORMAL HIGH (ref 11.5–15.5)
WBC: 15.5 10*3/uL — ABNORMAL HIGH (ref 4.0–10.5)
nRBC: 0 % (ref 0.0–0.2)

## 2019-04-16 LAB — URINE CULTURE: Culture: NO GROWTH

## 2019-04-16 LAB — COMPREHENSIVE METABOLIC PANEL
ALT: 26 U/L (ref 0–44)
AST: 29 U/L (ref 15–41)
Albumin: 2.4 g/dL — ABNORMAL LOW (ref 3.5–5.0)
Alkaline Phosphatase: 79 U/L (ref 38–126)
Anion gap: 12 (ref 5–15)
BUN: 14 mg/dL (ref 8–23)
CO2: 21 mmol/L — ABNORMAL LOW (ref 22–32)
Calcium: 8 mg/dL — ABNORMAL LOW (ref 8.9–10.3)
Chloride: 109 mmol/L (ref 98–111)
Creatinine, Ser: 1.06 mg/dL — ABNORMAL HIGH (ref 0.44–1.00)
GFR calc Af Amer: 57 mL/min — ABNORMAL LOW (ref >60)
GFR calc non Af Amer: 49 mL/min — ABNORMAL LOW (ref >60)
Glucose, Bld: 125 mg/dL — ABNORMAL HIGH (ref 70–99)
Potassium: 4.2 mmol/L (ref 3.5–5.1)
Sodium: 142 mmol/L (ref 135–145)
Total Bilirubin: 2.1 mg/dL — ABNORMAL HIGH (ref 0.3–1.2)
Total Protein: 5.3 g/dL — ABNORMAL LOW (ref 6.5–8.1)

## 2019-04-16 LAB — MAGNESIUM: Magnesium: 1.3 mg/dL — ABNORMAL LOW (ref 1.7–2.4)

## 2019-04-16 LAB — TROPONIN I (HIGH SENSITIVITY): Troponin I (High Sensitivity): 168 ng/L (ref <18)

## 2019-04-16 LAB — PHOSPHORUS: Phosphorus: 4.2 mg/dL (ref 2.5–4.6)

## 2019-04-16 LAB — MRSA PCR SCREENING: MRSA by PCR: NEGATIVE

## 2019-04-16 MED ORDER — PHENOL 1.4 % MT LIQD
1.0000 | OROMUCOSAL | Status: DC | PRN
  Filled 2019-04-16: qty 177

## 2019-04-16 MED ORDER — GADOBUTROL 1 MMOL/ML IV SOLN
7.0000 mL | Freq: Once | INTRAVENOUS | Status: AC | PRN
  Administered 2019-04-16: 7 mL via INTRAVENOUS

## 2019-04-16 MED ORDER — MAGNESIUM SULFATE 2 GM/50ML IV SOLN
2.0000 g | Freq: Once | INTRAVENOUS | Status: AC
  Administered 2019-04-16: 2 g via INTRAVENOUS
  Filled 2019-04-16: qty 50

## 2019-04-16 MED ORDER — DIAZEPAM 5 MG PO TABS
5.0000 mg | ORAL_TABLET | Freq: Once | ORAL | Status: AC
  Administered 2019-04-16: 13:00:00 5 mg via ORAL
  Filled 2019-04-16: qty 1

## 2019-04-16 NOTE — Progress Notes (Signed)
Providing Compassionate, Quality Care - Together   Subjective: Patient sleeping soundly. Was able to wake up and tell me she was not hurting, but fell back asleep before further questions could be answered. Nurse reports patient with difficulty getting MRI last night. Does not feel it's a pain control issue and is requesting something to help patient relax for MRI.  Objective: Vital signs in last 24 hours: Temp:  [97.9 F (36.6 C)-101.9 F (38.8 C)] 97.9 F (36.6 C) (01/17 0354) Pulse Rate:  [97-125] 97 (01/17 0354) Resp:  [14-37] 25 (01/17 0354) BP: (100-137)/(50-84) 113/76 (01/17 0354) SpO2:  [90 %-100 %] 98 % (01/17 0354) Weight:  [70.3 kg] 70.3 kg (01/16 1048)  Intake/Output from previous day: 01/16 0701 - 01/17 0700 In: 3142.4 [I.V.:642.4; IV Piggyback:2500] Out: -  Intake/Output this shift: No intake/output data recorded.  Responds to voice, follows commands PERRLA MAE, Strength and sensation grossly intact   Lab Results: Recent Labs    04/15/19 1052 04/15/19 1052 04/15/19 1148 04/16/19 0346  WBC 15.9*  --   --  8.9  HGB 12.8   < > 13.6 7.4*  HCT 41.1   < > 40.0 24.3*  PLT 204  --   --  112*   < > = values in this interval not displayed.   BMET Recent Labs    04/15/19 1840 04/16/19 0706  NA 138 142  K 3.3* 4.2  CL 103 109  CO2 23 21*  GLUCOSE 104* 125*  BUN 11 14  CREATININE 1.03* 1.06*  CALCIUM 7.6* 8.0*    Studies/Results: CT Angio Chest PE W and/or Wo Contrast  Result Date: 04/15/2019 CLINICAL DATA:  Shortness of breath.  Nausea and vomiting. EXAM: CT ANGIOGRAPHY CHEST CT ABDOMEN AND PELVIS WITH CONTRAST TECHNIQUE: Multidetector CT imaging of the chest was performed using the standard protocol during bolus administration of intravenous contrast. Multiplanar CT image reconstructions and MIPs were obtained to evaluate the vascular anatomy. Multidetector CT imaging of the abdomen and pelvis was performed using the standard protocol during bolus  administration of intravenous contrast. CONTRAST:  162mL OMNIPAQUE IOHEXOL 350 MG/ML SOLN COMPARISON:  Abdominal CT scan 03/06/2019 the FINDINGS: CTA CHEST FINDINGS Cardiovascular: The heart is normal in size and stable. There is stable prominent pericardial fat and a small persistent pericardial effusion. The aorta is normal in caliber. Scattered atherosclerotic calcifications. Stable coronary artery calcifications. The pulmonary arterial tree is well opacified. No filling defects to suggest pulmonary embolism. Mediastinum/Nodes: Stable scattered mediastinal and hilar lymph nodes and scattered calcified nodes. The esophagus is grossly normal. Lungs/Pleura: Mosaic pattern of ground-glass attenuation in the lungs diffusely likely due to small airways disease such as asthma, constrictive, acute or respiratory bronchiolitis, hypersensitivity pneumonitis or cryptogenic organizing pneumonia. No focal infiltrates or worrisome pulmonary lesions. There are patchy areas of subsegmental atelectasis and a small right pleural effusion. Musculoskeletal: No breast masses, supraclavicular or axillary adenopathy. The bony thorax is intact. Vertebral augmentation changes are noted at T11. Review of the MIP images confirms the above findings. CT ABDOMEN and PELVIS FINDINGS Hepatobiliary: No focal hepatic lesions are identified. There is mild intra and extrahepatic biliary dilatation likely due to prior cholecystectomy. The portal and hepatic veins are patent. Pancreas: No mass, inflammation or ductal dilatation. Stable moderate atrophy. Spleen: Normal size.  Small calcified granulomas. Adrenals/Urinary Tract: The adrenal glands and kidneys are unremarkable. The bladder appears normal. Stomach/Bowel: The stomach, duodenum, small bowel and colon are grossly normal. No acute inflammatory changes, mass lesions or  obstructive findings. The terminal ileum and appendix are normal. Descending colon and sigmoid colon diverticulosis but no  findings for acute diverticulitis. Vascular/Lymphatic: Advanced atherosclerotic calcifications involving the aorta and iliac arteries. No aneurysm or dissection. The branch vessels are patent. The major venous structures are patent. No mesenteric or retroperitoneal mass or adenopathy. Small scattered lymph nodes are stable. Reproductive: Surgically absent. Other: No pelvic mass or free pelvic fluid collections. No inguinal mass or adenopathy. Musculoskeletal: A stable appearing right hip prosthesis. Advanced degenerative changes involving the left hip. Postoperative changes involving the spine with marked lytic appearance of L2, L3 and L4. T12 also appears somewhat expanded and lytic. There is also a aggressive lytic lesion involving the left ischium and marked periostitis. Findings suspicious for osseous metastatic disease. Review of the MIP images confirms the above findings. IMPRESSION: 1. No CT findings for pulmonary embolism. 2. No aortic aneurysm or dissection. 3. Persistent small pericardial effusion and prominent pericardial fat. 4. Mosaic pattern of ground-glass attenuation throughout the lungs most likely small airways disease. Please see above discussion. 5. Small right pleural effusion and overlying atelectasis. 6. No acute abdominal/pelvic findings, mass lesions or adenopathy. 7. Prior small nondisplaced fracture involving the left ischium now appears to be an aggressive lytic process with marked periostitis. There is also marked lucency of the L2, L3 and L4 vertebral bodies and possibly T12. Could not exclude metastatic disease. Electronically Signed   By: Marijo Sanes M.D.   On: 04/15/2019 13:50   CT ABDOMEN PELVIS W CONTRAST  Result Date: 04/15/2019 CLINICAL DATA:  Shortness of breath.  Nausea and vomiting. EXAM: CT ANGIOGRAPHY CHEST CT ABDOMEN AND PELVIS WITH CONTRAST TECHNIQUE: Multidetector CT imaging of the chest was performed using the standard protocol during bolus administration of  intravenous contrast. Multiplanar CT image reconstructions and MIPs were obtained to evaluate the vascular anatomy. Multidetector CT imaging of the abdomen and pelvis was performed using the standard protocol during bolus administration of intravenous contrast. CONTRAST:  135mL OMNIPAQUE IOHEXOL 350 MG/ML SOLN COMPARISON:  Abdominal CT scan 03/06/2019 the FINDINGS: CTA CHEST FINDINGS Cardiovascular: The heart is normal in size and stable. There is stable prominent pericardial fat and a small persistent pericardial effusion. The aorta is normal in caliber. Scattered atherosclerotic calcifications. Stable coronary artery calcifications. The pulmonary arterial tree is well opacified. No filling defects to suggest pulmonary embolism. Mediastinum/Nodes: Stable scattered mediastinal and hilar lymph nodes and scattered calcified nodes. The esophagus is grossly normal. Lungs/Pleura: Mosaic pattern of ground-glass attenuation in the lungs diffusely likely due to small airways disease such as asthma, constrictive, acute or respiratory bronchiolitis, hypersensitivity pneumonitis or cryptogenic organizing pneumonia. No focal infiltrates or worrisome pulmonary lesions. There are patchy areas of subsegmental atelectasis and a small right pleural effusion. Musculoskeletal: No breast masses, supraclavicular or axillary adenopathy. The bony thorax is intact. Vertebral augmentation changes are noted at T11. Review of the MIP images confirms the above findings. CT ABDOMEN and PELVIS FINDINGS Hepatobiliary: No focal hepatic lesions are identified. There is mild intra and extrahepatic biliary dilatation likely due to prior cholecystectomy. The portal and hepatic veins are patent. Pancreas: No mass, inflammation or ductal dilatation. Stable moderate atrophy. Spleen: Normal size.  Small calcified granulomas. Adrenals/Urinary Tract: The adrenal glands and kidneys are unremarkable. The bladder appears normal. Stomach/Bowel: The stomach,  duodenum, small bowel and colon are grossly normal. No acute inflammatory changes, mass lesions or obstructive findings. The terminal ileum and appendix are normal. Descending colon and sigmoid colon diverticulosis but no findings  for acute diverticulitis. Vascular/Lymphatic: Advanced atherosclerotic calcifications involving the aorta and iliac arteries. No aneurysm or dissection. The branch vessels are patent. The major venous structures are patent. No mesenteric or retroperitoneal mass or adenopathy. Small scattered lymph nodes are stable. Reproductive: Surgically absent. Other: No pelvic mass or free pelvic fluid collections. No inguinal mass or adenopathy. Musculoskeletal: A stable appearing right hip prosthesis. Advanced degenerative changes involving the left hip. Postoperative changes involving the spine with marked lytic appearance of L2, L3 and L4. T12 also appears somewhat expanded and lytic. There is also a aggressive lytic lesion involving the left ischium and marked periostitis. Findings suspicious for osseous metastatic disease. Review of the MIP images confirms the above findings. IMPRESSION: 1. No CT findings for pulmonary embolism. 2. No aortic aneurysm or dissection. 3. Persistent small pericardial effusion and prominent pericardial fat. 4. Mosaic pattern of ground-glass attenuation throughout the lungs most likely small airways disease. Please see above discussion. 5. Small right pleural effusion and overlying atelectasis. 6. No acute abdominal/pelvic findings, mass lesions or adenopathy. 7. Prior small nondisplaced fracture involving the left ischium now appears to be an aggressive lytic process with marked periostitis. There is also marked lucency of the L2, L3 and L4 vertebral bodies and possibly T12. Could not exclude metastatic disease. Electronically Signed   By: Marijo Sanes M.D.   On: 04/15/2019 13:50   DG Chest Port 1 View  Result Date: 04/15/2019 CLINICAL DATA:  Altered mental  status. EXAM: PORTABLE CHEST 1 VIEW COMPARISON:  March 29, 2019 FINDINGS: Cardiomediastinal silhouette is enlarged. Mediastinal contours appear intact. There is no evidence of pleural effusion or pneumothorax. Bilateral lower lobe atelectasis versus mild peribronchial airspace consolidation. Osseous structures are without acute abnormality. Soft tissues are grossly normal. IMPRESSION: 1. Bilateral lower lobe atelectasis versus mild peribronchial airspace consolidation. 2. Enlarged cardiac silhouette. Electronically Signed   By: Fidela Salisbury M.D.   On: 04/15/2019 11:40   US Abdomen Limited RUQ  Result Date: 04/15/2019 CLINICAL DATA:  Elevated liver function tests. EXAM: ULTRASOUND ABDOMEN LIMITED RIGHT UPPER QUADRANT COMPARISON:  CT of the abdomen April 15, 2019 FINDINGS: Gallbladder: Post cholecystectomy.  No focal fluid collection. Common bile duct: Diameter: 6.6 mm Liver: No focal lesion identified. Within normal limits in parenchymal echogenicity. Portal vein is patent on color Doppler imaging with normal direction of blood flow towards the liver. Other: None. IMPRESSION: Normal right upper quadrant ultrasound post cholecystectomy. Electronically Signed   By: Fidela Salisbury M.D.   On: 04/15/2019 16:00    Assessment/Plan: Patient admitted yesterday (04/15/2019) by the hospitalist for sepsis. Cefepime started yesterday. CT scan of her abdomen and pelvis with findings concerning for metastatic disease. MRI of thoracic spine and lumber spine ordered for further evaluation of lesions. Unable to obtain imaging this morning secondary to patient's restlessness.   LOS: 1 day    -Will add PO Valium for nurse to give one hour prior to MRI -No further recommendations from Neurosurgery at this time   Viona Gilmore, [place], AGNP-C Nurse Practitioner  [facility] [place]. [address], [address], [place], [place] [address] P: [phone]    F:  [phone]  04/16/2019, 10:02 AM

## 2019-04-16 NOTE — Progress Notes (Signed)
Spoke to daughter [name] and updated on care. Answered all questions and concerns. Patient is currently off the floor to preform MRI.

## 2019-04-16 NOTE — Plan of Care (Signed)
  Problem: Health Behavior/Discharge Planning: Goal: Ability to manage health-related needs will improve Outcome: Progressing   Problem: Clinical Measurements: Goal: Ability to maintain clinical measurements within normal limits will improve Outcome: Progressing Goal: Will remain free from infection Outcome: Progressing Goal: Diagnostic test results will improve Outcome: Progressing Goal: Respiratory complications will improve Outcome: Progressing Goal: Cardiovascular complication will be avoided Outcome: Progressing   Problem: Activity: Goal: Risk for activity intolerance will decrease Outcome: Progressing   Problem: Nutrition: Goal: Adequate nutrition will be maintained Outcome: Progressing   Problem: Coping: Goal: Level of anxiety will decrease Outcome: Progressing   Problem: Elimination: Goal: Will not experience complications related to bowel motility Outcome: Progressing Goal: Will not experience complications related to urinary retention Outcome: Progressing   Problem: Pain Managment: Goal: General experience of comfort will improve Outcome: Progressing   

## 2019-04-16 NOTE — Progress Notes (Addendum)
PROGRESS NOTE    Betty Jackson  WUX:[id number] DOB: [birth year] DOA: 04/15/2019 PCP: Eulas Post, MD   Brief Narrative: 85 year old with past medical history significant for CKD stage III, CAD, stroke, temporal arteritis, RA on chronic steroids, status post L2-3, L3-L4 posterior fusion by Dr. Ellene Route in 2015 and L5-S1 microdiscectomy by Dr. Ellene Route  in 2017.  patient daughter noted that patient has been limping around, patient complaining of worsening back pain.  Patient was found to be lethargic and confused by daughter the morning of admission.  Patient has been vomited in the bed.  Evaluation in the ED CT abdomen and pelvis revealed a prior  small nondisplaced fracture involving the left ischium now appears to be an aggressive lytic process with marked periostitis.  There is also marked lucency of L2, L3 and L4 vertebral bodies and possible T12.   Patient was found to be febrile temperature 101.9, tachycardic heart rate 125, respiration rate 37, blood pressure 100/50.  Plan my elevation of troponins, CRP at 17, ESR 25, UA 0-5 white blood cell.  Right upper quadrant ultrasound post cholecystectomy.  Assessment & Plan:   Active Problems:   Sepsis ([place])   Estimated body mass index is 26.19 kg/m as calculated from the following:   Height as of this encounter: 5' 4.5" (1.638 m).   Weight as of this encounter: 70.3 kg.  1-Sepsis; Unclear source  ? PNA UA with 0-5 white blood cell.  Follow urine culture Ultrasound negative. COVID-19 negative. Follow blood cultures. MRI to rule out infectious process of the spine. Continue with IV antibiotics. CT angio Lungs; Mosaic pattern of ground-glass attenuation in the lungs diffusely likely due to small airways disease such as asthma, constrictive, acute or respiratory bronchiolitis, hypersensitivity pneumonitis or cryptogenic organizing pneumonia  2- Abnormal Left Ischium Lytic lesions ? and Lumbar spine.  Prior small nondisplaced fracture  involving the left ischium now appears to be an aggressive lytic process with marked periostitis. There is also marked lucency of the L2, L3 and L4 vertebral bodies and possibly T12. Could not exclude metastatic disease.;  -Plan to proceed with MRI to further evaluate. -Neurosurgery consulted.  -MRI thoracic and lumbar spine negative for infection or bone lytic lesions. She has multi level spinal stenosis and cord compression T 11-12.  Discussed with radiologist, MRI of pelvic wont give significant information about ischium lesion. Ischium finding could be old healing fracture or primary bone tumor.  Patient will required biopsy to better evaluate.  Will consult Ortho tomorrow.   Anemia: Prior hemoglobin per records around 9.7. Hb on admission  at 12, suspect hemoconcentration. Will repeat CBC.   Acute metabolic encephalopathy: Related to acute infectious process. Ammonia level normal.  Elevation of troponin: Patient denies chest pain. Troponins trending down. Follow 2D echo.  Hypomagnesemia: Replete IV  Chronic A fib;  On IV metoprolol. Patient not tolerating oral.  Resume Cardizem when tolerating oral.  Holding eliquis in care require intervention.  Wont start heparin due to anemia.   CKD stage III; stable, cr.   RA; On stress dose steroids currently.   DVT prophylaxis: Heparin  Code Status: DNR Family Communication: Disposition Plan: Remain in the hospital for management of infectious process , encephalopathy  Consultants:  Neurosurgery  Procedures:  [place]; Normal right upper quadrant ultrasound post cholecystectomy.    Antimicrobials:  Vancomycin 1-16 Cefepime 1-16  Subjective: Sleepy, would wake up and say few words. Falls back to sleep.  She denies chest pain.  Patient received Pain  medications earlier.   Objective: Vitals:   04/15/19 1938 04/15/19 2154 04/16/19 0032 04/16/19 0354  BP: (!) 100/50 124/68 113/65 113/76  Pulse: (!) 114 (!) 104 (!) 101 97   Resp: (!) 27 (!) 21 14 (!) 25  Temp: 100.1 F (37.8 C) 98.5 F (36.9 C) 99.1 F (37.3 C) 97.9 F (36.6 C)  TempSrc: Oral Oral Oral Oral  SpO2: 97% 93% 100% 98%  Weight:      Height:        Intake/Output Summary (Last 24 hours) at 04/16/2019 1016 Last data filed at 04/16/2019 0401 Gross per 24 hour  Intake 3142.39 ml  Output --  Net 3142.39 ml   Filed Weights   04/15/19 1048  Weight: 70.3 kg    Examination:  General exam: Appears calm and comfortable, sleepy  Respiratory system: Clear to auscultation. Respiratory effort normal. Cardiovascular system: S1 & S2 heard, RRR. No JVD, murmurs, rubs, gallops or clicks. No pedal edema. Gastrointestinal system: Abdomen is nondistended, soft and nontender. No organomegaly or masses felt. Normal bowel sounds heard. Central nervous system: sleepy  Extremities: Trace edema     Data Reviewed: I have personally reviewed following labs and imaging studies  CBC: Recent Labs  Lab 04/15/19 1052 04/15/19 1148 04/16/19 0346  WBC 15.9*  --  8.9  NEUTROABS 11.9*  --  6.7  HGB 12.8 13.6 7.4*  HCT 41.1 40.0 24.3*  MCV 92.8  --  97.2  PLT 204  --  657*   Basic Metabolic Panel: Recent Labs  Lab 04/15/19 1052 04/15/19 1148 04/15/19 1840 04/16/19 0706  NA 140 139 138 142  K 2.7* 2.6* 3.3* 4.2  CL 96* 101 103 109  CO2 29  --  23 21*  GLUCOSE 136* 122* 104* 125*  BUN _0 CREATININE 1.01* 0.80 1.03* 1.06*  CALCIUM 8.9  --  7.6* 8.0*  MG  --   --   --  1.3*  PHOS  --   --   --  4.2   GFR: Estimated Creatinine Clearance: 39.9 mL/min (A) (by C-G formula based on SCr of 1.06 mg/dL (H)). Liver Function Tests: Recent Labs  Lab 04/15/19 1052 04/16/19 0706  AST 46* 29  ALT 36 26  ALKPHOS 101 79  BILITOT 2.5* 2.1*  PROT 6.3* 5.3*  ALBUMIN 3.3* 2.4*   Recent Labs  Lab 04/15/19 1052  LIPASE 28   Recent Labs  Lab 04/15/19 1134  AMMONIA 28   Coagulation Profile: Recent Labs  Lab 04/15/19 1052  INR 1.3*    Cardiac Enzymes: Recent Labs  Lab 04/15/19 1840  CKTOTAL 78   BNP (last 3 results) No results for input(s): PROBNP in the last 8760 hours. HbA1C: No results for input(s): HGBA1C in the last 72 hours. CBG: No results for input(s): GLUCAP in the last 168 hours. Lipid Profile: No results for input(s): CHOL, HDL, LDLCALC, TRIG, CHOLHDL, LDLDIRECT in the last 72 hours. Thyroid Function Tests: No results for input(s): TSH, T4TOTAL, FREET4, T3FREE, THYROIDAB in the last 72 hours. Anemia Panel: No results for input(s): VITAMINB12, FOLATE, FERRITIN, TIBC, IRON, RETICCTPCT in the last 72 hours. Sepsis Labs: Recent Labs  Lab 04/15/19 1052 04/15/19 1332  LATICACIDVEN 2.4* 2.6*    Recent Results (from the past 240 hour(s))  Blood Culture (routine x 2)     Status: None (Preliminary result)   Collection Time: 04/15/19 10:52 AM   Specimen: BLOOD  Result Value Ref Range Status   Specimen Description  BLOOD LEFT ANTECUBITAL  Final   Special Requests   Final    BOTTLES DRAWN AEROBIC AND ANAEROBIC Blood Culture results may not be optimal due to an inadequate volume of blood received in culture bottles   Culture   Final    NO GROWTH < 24 HOURS Performed at [facility] [address], [place], [place] [address]    Report Status PENDING  Incomplete  Blood Culture (routine x 2)     Status: None (Preliminary result)   Collection Time: 04/15/19 10:54 AM   Specimen: BLOOD  Result Value Ref Range Status   Specimen Description BLOOD RIGHT ANTECUBITAL  Final   Special Requests   Final    BOTTLES DRAWN AEROBIC AND ANAEROBIC Blood Culture adequate volume   Culture   Final    NO GROWTH < 24 HOURS Performed at [facility], [place] [address] [place], [place] [address]    Report Status PENDING  Incomplete         Radiology Studies: CT Angio Chest PE W and/or Wo Contrast  Result Date: 04/15/2019 CLINICAL DATA:  Shortness of breath.  Nausea and vomiting. EXAM: CT ANGIOGRAPHY  CHEST CT ABDOMEN AND PELVIS WITH CONTRAST TECHNIQUE: Multidetector CT imaging of the chest was performed using the standard protocol during bolus administration of intravenous contrast. Multiplanar CT image reconstructions and MIPs were obtained to evaluate the vascular anatomy. Multidetector CT imaging of the abdomen and pelvis was performed using the standard protocol during bolus administration of intravenous contrast. CONTRAST:  173m OMNIPAQUE IOHEXOL 350 MG/ML SOLN COMPARISON:  Abdominal CT scan 03/06/2019 the FINDINGS: CTA CHEST FINDINGS Cardiovascular: The heart is normal in size and stable. There is stable prominent pericardial fat and a small persistent pericardial effusion. The aorta is normal in caliber. Scattered atherosclerotic calcifications. Stable coronary artery calcifications. The pulmonary arterial tree is well opacified. No filling defects to suggest pulmonary embolism. Mediastinum/Nodes: Stable scattered mediastinal and hilar lymph nodes and scattered calcified nodes. The esophagus is grossly normal. Lungs/Pleura: Mosaic pattern of ground-glass attenuation in the lungs diffusely likely due to small airways disease such as asthma, constrictive, acute or respiratory bronchiolitis, hypersensitivity pneumonitis or cryptogenic organizing pneumonia. No focal infiltrates or worrisome pulmonary lesions. There are patchy areas of subsegmental atelectasis and a small right pleural effusion. Musculoskeletal: No breast masses, supraclavicular or axillary adenopathy. The bony thorax is intact. Vertebral augmentation changes are noted at T11. Review of the MIP images confirms the above findings. CT ABDOMEN and PELVIS FINDINGS Hepatobiliary: No focal hepatic lesions are identified. There is mild intra and extrahepatic biliary dilatation likely due to prior cholecystectomy. The portal and hepatic veins are patent. Pancreas: No mass, inflammation or ductal dilatation. Stable moderate atrophy. Spleen: Normal  size.  Small calcified granulomas. Adrenals/Urinary Tract: The adrenal glands and kidneys are unremarkable. The bladder appears normal. Stomach/Bowel: The stomach, duodenum, small bowel and colon are grossly normal. No acute inflammatory changes, mass lesions or obstructive findings. The terminal ileum and appendix are normal. Descending colon and sigmoid colon diverticulosis but no findings for acute diverticulitis. Vascular/Lymphatic: Advanced atherosclerotic calcifications involving the aorta and iliac arteries. No aneurysm or dissection. The branch vessels are patent. The major venous structures are patent. No mesenteric or retroperitoneal mass or adenopathy. Small scattered lymph nodes are stable. Reproductive: Surgically absent. Other: No pelvic mass or free pelvic fluid collections. No inguinal mass or adenopathy. Musculoskeletal: A stable appearing right hip prosthesis. Advanced degenerative changes involving the left hip. Postoperative changes involving the  spine with marked lytic appearance of L2, L3 and L4. T12 also appears somewhat expanded and lytic. There is also a aggressive lytic lesion involving the left ischium and marked periostitis. Findings suspicious for osseous metastatic disease. Review of the MIP images confirms the above findings. IMPRESSION: 1. No CT findings for pulmonary embolism. 2. No aortic aneurysm or dissection. 3. Persistent small pericardial effusion and prominent pericardial fat. 4. Mosaic pattern of ground-glass attenuation throughout the lungs most likely small airways disease. Please see above discussion. 5. Small right pleural effusion and overlying atelectasis. 6. No acute abdominal/pelvic findings, mass lesions or adenopathy. 7. Prior small nondisplaced fracture involving the left ischium now appears to be an aggressive lytic process with marked periostitis. There is also marked lucency of the L2, L3 and L4 vertebral bodies and possibly T12. Could not exclude metastatic  disease. Electronically Signed   By: Marijo Sanes M.D.   On: 04/15/2019 13:50   CT ABDOMEN PELVIS W CONTRAST  Result Date: 04/15/2019 CLINICAL DATA:  Shortness of breath.  Nausea and vomiting. EXAM: CT ANGIOGRAPHY CHEST CT ABDOMEN AND PELVIS WITH CONTRAST TECHNIQUE: Multidetector CT imaging of the chest was performed using the standard protocol during bolus administration of intravenous contrast. Multiplanar CT image reconstructions and MIPs were obtained to evaluate the vascular anatomy. Multidetector CT imaging of the abdomen and pelvis was performed using the standard protocol during bolus administration of intravenous contrast. CONTRAST:  19m OMNIPAQUE IOHEXOL 350 MG/ML SOLN COMPARISON:  Abdominal CT scan 03/06/2019 the FINDINGS: CTA CHEST FINDINGS Cardiovascular: The heart is normal in size and stable. There is stable prominent pericardial fat and a small persistent pericardial effusion. The aorta is normal in caliber. Scattered atherosclerotic calcifications. Stable coronary artery calcifications. The pulmonary arterial tree is well opacified. No filling defects to suggest pulmonary embolism. Mediastinum/Nodes: Stable scattered mediastinal and hilar lymph nodes and scattered calcified nodes. The esophagus is grossly normal. Lungs/Pleura: Mosaic pattern of ground-glass attenuation in the lungs diffusely likely due to small airways disease such as asthma, constrictive, acute or respiratory bronchiolitis, hypersensitivity pneumonitis or cryptogenic organizing pneumonia. No focal infiltrates or worrisome pulmonary lesions. There are patchy areas of subsegmental atelectasis and a small right pleural effusion. Musculoskeletal: No breast masses, supraclavicular or axillary adenopathy. The bony thorax is intact. Vertebral augmentation changes are noted at T11. Review of the MIP images confirms the above findings. CT ABDOMEN and PELVIS FINDINGS Hepatobiliary: No focal hepatic lesions are identified. There is  mild intra and extrahepatic biliary dilatation likely due to prior cholecystectomy. The portal and hepatic veins are patent. Pancreas: No mass, inflammation or ductal dilatation. Stable moderate atrophy. Spleen: Normal size.  Small calcified granulomas. Adrenals/Urinary Tract: The adrenal glands and kidneys are unremarkable. The bladder appears normal. Stomach/Bowel: The stomach, duodenum, small bowel and colon are grossly normal. No acute inflammatory changes, mass lesions or obstructive findings. The terminal ileum and appendix are normal. Descending colon and sigmoid colon diverticulosis but no findings for acute diverticulitis. Vascular/Lymphatic: Advanced atherosclerotic calcifications involving the aorta and iliac arteries. No aneurysm or dissection. The branch vessels are patent. The major venous structures are patent. No mesenteric or retroperitoneal mass or adenopathy. Small scattered lymph nodes are stable. Reproductive: Surgically absent. Other: No pelvic mass or free pelvic fluid collections. No inguinal mass or adenopathy. Musculoskeletal: A stable appearing right hip prosthesis. Advanced degenerative changes involving the left hip. Postoperative changes involving the spine with marked lytic appearance of L2, L3 and L4. T12 also appears somewhat expanded and lytic. There is  also a aggressive lytic lesion involving the left ischium and marked periostitis. Findings suspicious for osseous metastatic disease. Review of the MIP images confirms the above findings. IMPRESSION: 1. No CT findings for pulmonary embolism. 2. No aortic aneurysm or dissection. 3. Persistent small pericardial effusion and prominent pericardial fat. 4. Mosaic pattern of ground-glass attenuation throughout the lungs most likely small airways disease. Please see above discussion. 5. Small right pleural effusion and overlying atelectasis. 6. No acute abdominal/pelvic findings, mass lesions or adenopathy. 7. Prior small nondisplaced  fracture involving the left ischium now appears to be an aggressive lytic process with marked periostitis. There is also marked lucency of the L2, L3 and L4 vertebral bodies and possibly T12. Could not exclude metastatic disease. Electronically Signed   By: Marijo Sanes M.D.   On: 04/15/2019 13:50   DG Chest Port 1 View  Result Date: 04/15/2019 CLINICAL DATA:  Altered mental status. EXAM: PORTABLE CHEST 1 VIEW COMPARISON:  March 29, 2019 FINDINGS: Cardiomediastinal silhouette is enlarged. Mediastinal contours appear intact. There is no evidence of pleural effusion or pneumothorax. Bilateral lower lobe atelectasis versus mild peribronchial airspace consolidation. Osseous structures are without acute abnormality. Soft tissues are grossly normal. IMPRESSION: 1. Bilateral lower lobe atelectasis versus mild peribronchial airspace consolidation. 2. Enlarged cardiac silhouette. Electronically Signed   By: Fidela Salisbury M.D.   On: 04/15/2019 11:40   US Abdomen Limited RUQ  Result Date: 04/15/2019 CLINICAL DATA:  Elevated liver function tests. EXAM: ULTRASOUND ABDOMEN LIMITED RIGHT UPPER QUADRANT COMPARISON:  CT of the abdomen April 15, 2019 FINDINGS: Gallbladder: Post cholecystectomy.  No focal fluid collection. Common bile duct: Diameter: 6.6 mm Liver: No focal lesion identified. Within normal limits in parenchymal echogenicity. Portal vein is patent on color Doppler imaging with normal direction of blood flow towards the liver. Other: None. IMPRESSION: Normal right upper quadrant ultrasound post cholecystectomy. Electronically Signed   By: Fidela Salisbury M.D.   On: 04/15/2019 16:00        Scheduled Meds: . vitamin C  1,000 mg Oral Daily  . cholecalciferol  1,000 Units Oral Daily  . diazepam  5 mg Oral Once  . escitalopram  10 mg Oral Daily  . heparin  5,000 Units Subcutaneous Q8H  . hydrocortisone sod succinate (SOLU-CORTEF) inj  50 mg Intravenous Q6H  . metoprolol tartrate  5 mg  Intravenous Q6H  . pantoprazole  40 mg Oral Daily  . rosuvastatin  10 mg Oral QHS  . vitamin E  400 Units Oral Daily   Continuous Infusions: . sodium chloride 100 mL/hr at 04/16/19 0537  . ceFEPime (MAXIPIME) IV 2 g (04/16/19 0214)  . magnesium sulfate bolus IVPB    . vancomycin       LOS: 1 day    Time spent: 35 minutes.     Elmarie Shiley, MD [facility]   If 7PM-7AM, please contact night-coverage [email or web address] Password [email or web address] 04/16/2019, 10:16 AM

## 2019-04-16 NOTE — Progress Notes (Signed)
I have reviewed the patient's thoracic and lumbar MRIs performed today with and without contrast.  The patient has had a fusion at L2-3 and   L3-4 which looks good.   She has mild multifactorial stenosis at   L1-2 with some rotation.  At L4-5, the patient has more moderate to severe multifactorial spinal stenosis with a right sided herniated disc.  Her thoracic MRI demonstrates moderate multifactorial spinal stenosis at T11-12 and T12-L1.  I do not see any signs of infection or lytic bone lesions.  There is no indication for urgent spinal surgery.  I will make Dr. Ellene Route aware of the patient's admission and he can follow up with her tomorrow for further recommendations.    Regarding her left ischium lytic lesion, I would recommend further workup with a pelvic MRI with and without contrast.

## 2019-04-16 NOTE — Progress Notes (Signed)
Attempted MRI, patient unable to tolerate exam. Was complaining about pain before exam began, asking [place] for a pain pill. We informed her we don't have or give medication in this department and asked if she thinks she can tolerate this. She said she will try. She then stopped exam right after starting to use the bathroom. She then stated she felt she needed to vomit before we could start again. Stated the pain was too much to continue. RN informed, will attempt MRI again later, possibly with medication to help patient tolerate exam.

## 2019-04-17 ENCOUNTER — Inpatient Hospital Stay (HOSPITAL_COMMUNITY): Payer: Medicare Other

## 2019-04-17 DIAGNOSIS — I361 Nonrheumatic tricuspid (valve) insufficiency: Secondary | ICD-10-CM

## 2019-04-17 LAB — FERRITIN: Ferritin: 238 ng/mL (ref 11–307)

## 2019-04-17 LAB — CBC
HCT: 29.5 % — ABNORMAL LOW (ref 36.0–46.0)
Hemoglobin: 9.3 g/dL — ABNORMAL LOW (ref 12.0–15.0)
MCH: 29.6 pg (ref 26.0–34.0)
MCHC: 31.5 g/dL (ref 30.0–36.0)
MCV: 93.9 fL (ref 80.0–100.0)
Platelets: 150 10*3/uL (ref 150–400)
RBC: 3.14 MIL/uL — ABNORMAL LOW (ref 3.87–5.11)
RDW: 15.3 % (ref 11.5–15.5)
WBC: 15.6 10*3/uL — ABNORMAL HIGH (ref 4.0–10.5)
nRBC: 0 % (ref 0.0–0.2)

## 2019-04-17 LAB — FOLATE: Folate: 13.4 ng/mL (ref >5.9)

## 2019-04-17 LAB — RETICULOCYTES
Immature Retic Fract: 18.2 % — ABNORMAL HIGH (ref 2.3–15.9)
RBC.: 3.08 MIL/uL — ABNORMAL LOW (ref 3.87–5.11)
Retic Count, Absolute: 70.2 10*3/uL (ref 19.0–186.0)
Retic Ct Pct: 2.3 % (ref 0.4–3.1)

## 2019-04-17 LAB — BASIC METABOLIC PANEL
Anion gap: 10 (ref 5–15)
BUN: 19 mg/dL (ref 8–23)
CO2: 24 mmol/L (ref 22–32)
Calcium: 8.8 mg/dL — ABNORMAL LOW (ref 8.9–10.3)
Chloride: 109 mmol/L (ref 98–111)
Creatinine, Ser: 0.94 mg/dL (ref 0.44–1.00)
GFR calc Af Amer: >60 mL/min (ref >60)
GFR calc non Af Amer: 56 mL/min — ABNORMAL LOW (ref >60)
Glucose, Bld: 126 mg/dL — ABNORMAL HIGH (ref 70–99)
Potassium: 4.4 mmol/L (ref 3.5–5.1)
Sodium: 143 mmol/L (ref 135–145)

## 2019-04-17 LAB — IRON AND TIBC
Iron: 10 ug/dL — ABNORMAL LOW (ref 28–170)
Saturation Ratios: 5 % — ABNORMAL LOW (ref 10.4–31.8)
TIBC: 218 ug/dL — ABNORMAL LOW (ref 250–450)
UIBC: 208 ug/dL

## 2019-04-17 LAB — ECHOCARDIOGRAM COMPLETE
Height: 64.5 in
Weight: 2480 oz

## 2019-04-17 LAB — MAGNESIUM: Magnesium: 2.3 mg/dL (ref 1.7–2.4)

## 2019-04-17 LAB — VITAMIN B12: Vitamin B-12: 1101 pg/mL — ABNORMAL HIGH (ref 180–914)

## 2019-04-17 MED ORDER — METOPROLOL TARTRATE 100 MG PO TABS
100.0000 mg | ORAL_TABLET | Freq: Two times a day (BID) | ORAL | Status: DC
  Administered 2019-04-17 – 2019-04-20 (×7): 100 mg via ORAL
  Filled 2019-04-17 (×9): qty 1

## 2019-04-17 MED ORDER — DILTIAZEM HCL 60 MG PO TABS
60.0000 mg | ORAL_TABLET | Freq: Three times a day (TID) | ORAL | Status: DC
  Administered 2019-04-17 – 2019-04-21 (×13): 60 mg via ORAL
  Filled 2019-04-17 (×13): qty 1

## 2019-04-17 MED ORDER — AMIODARONE HCL 200 MG PO TABS
200.0000 mg | ORAL_TABLET | Freq: Every day | ORAL | Status: DC
  Administered 2019-04-17 – 2019-04-18 (×2): 200 mg via ORAL
  Filled 2019-04-17 (×2): qty 1

## 2019-04-17 MED ORDER — GADOBUTROL 1 MMOL/ML IV SOLN
7.0000 mL | Freq: Once | INTRAVENOUS | Status: AC | PRN
  Administered 2019-04-17: 16:00:00 7 mL via INTRAVENOUS

## 2019-04-17 MED ORDER — SODIUM CHLORIDE 0.9 % IV SOLN
500.0000 mg | INTRAVENOUS | Status: AC
  Administered 2019-04-17 – 2019-04-21 (×5): 500 mg via INTRAVENOUS
  Filled 2019-04-17 (×5): qty 500

## 2019-04-17 MED ORDER — FERROUS SULFATE 325 (65 FE) MG PO TABS
325.0000 mg | ORAL_TABLET | Freq: Two times a day (BID) | ORAL | Status: DC
  Administered 2019-04-17 – 2019-04-21 (×8): 325 mg via ORAL
  Filled 2019-04-17 (×8): qty 1

## 2019-04-17 MED ORDER — APIXABAN 5 MG PO TABS
5.0000 mg | ORAL_TABLET | Freq: Two times a day (BID) | ORAL | Status: DC
  Administered 2019-04-17 – 2019-04-18 (×3): 5 mg via ORAL
  Filled 2019-04-17 (×3): qty 1

## 2019-04-17 NOTE — Progress Notes (Signed)
  Echocardiogram 2D Echocardiogram has been performed.  Betty Jackson 04/17/2019, 5:18 PM

## 2019-04-17 NOTE — Progress Notes (Signed)
VAST consulted to place PIV access. Upon arrival at bedside, pt out of room. Pt's daughter reported pt to be in MRI. Called pt's nurse, Hilaria Ota and requested she call VAST RN upon pt's return to room.

## 2019-04-17 NOTE — Consult Note (Addendum)
Reason for Consult:Left ischial lesion Referring Physician: B Regalado  Betty Jackson is an 85 y.o. female.  HPI: Betty Jackson was admitted with AMS and c/o back and hip pain. The pt tells me it was her right hip though she recently was diagnosed with a left ischial fx. CT was done to look at her back and it was noted that the ischial fx had developed into an aggressive lytic process and orthopedic surgery was consulted. She c/o no pain at rest.  Past Medical History:  Diagnosis Date  . Abdominal pain, epigastric 06/22/2013  . Abdominal pain, other specified site 12/24/2011  . Abnormal urinalysis 05/20/2017  . Acute respiratory failure with hypoxia ([place]) 05/20/2017  . Anemia, unspecified 10/28/2012  . Anxiety state, unspecified 10/28/2012  . Bruises easily    d/t being on prednisone and plavix  . CAD (coronary artery disease)    a. Stent RCA in [facility];  b. Cath approx 2009 - nonobs per pt report.  . Cataract    immature on the left eye  . Chronic insomnia 02/07/2013  . Chronic lower back pain    scoliosis  . CKD (chronic kidney disease) stage 3, GFR 30-59 ml/min   . Complication of anesthesia    pt has a very high tolerance to meds  . Coronary disease 05/20/2017  . CVA (cerebral infarction) 10/29/2012  . DDD (degenerative disc disease)   . Depression   . Diverticulitis    hx of  . Diverticulosis   . Elevated transaminase level 02/07/2013  . Enteritis   . GERD (gastroesophageal reflux disease) 09/01/2010  . Giant cell arteritis ([place])   . Hemorrhoids   . Herniated nucleus pulposus, L5-S1, right 11/04/2015  . History of scabies   . HTN (hypertension) 05/20/2017  . Hyperlipidemia    takes Lipitor daily  . Hypertension    takes Amlodipine,Losartan,Metoprolol,and HCTZ daily  . Incontinence of urine   . Influenza A 05/20/2017  . Insomnia    takes Restoril nightly  . Lumbar stenosis 04/25/2013  . Major depressive disorder, recurrent episode, moderate ([place]) 07/16/2013  . Migraines    "back  in my 20's; none since" (10/28/2012)  . Osteoporosis   . PAF (paroxysmal atrial fibrillation) ([place]) 2011   a. lone epidode in 2011 according to notes.  . Rheumatoid arthritis ([place])   . Scoliosis   . Sepsis ([place]) 05/20/2017  . Sinus bradycardia    a. on chronic bb  . Temporal arteritis ([place]) 2011   a. followed @ [facility]; potential flareup 10/28/2012/notes 10/28/2012  . Vocal cord dysfunction    "they don't operate properly" (10/28/2012)    Past Surgical History:  Procedure Laterality Date  . ABDOMINAL HYSTERECTOMY  ~ 1984   vaginally  . BACK SURGERY  7-34yr ago   X Stop  . BLADDER SUSPENSION  2001  . BREAST BIOPSY Right   . CATARACT EXTRACTION W/ INTRAOCULAR LENS IMPLANT Right ~ 08/2012  . CHEST TUBE INSERTION Left 03/08/2019   Procedure: Chest Tube Insertion;  Surgeon: VIvin Poot MD;  [place]  Service: Thoracic;  Laterality: Left;  . COLONOSCOPY  01/26/2012   Procedure: COLONOSCOPY;  Surgeon: MLadene Artist MD,FACG;  [facility]  Service: Endoscopy;  Laterality: N/A;  note the EGD is possible  . CORONARY ANGIOPLASTY WITH STENT PLACEMENT  2006   X 1 stent  . EPIDURAL BLOCK INJECTION    . ESOPHAGOGASTRODUODENOSCOPY  01/26/2012   Procedure: ESOPHAGOGASTRODUODENOSCOPY (EGD);  Surgeon: MLadene Artist MD,FACG;  [facility];  Service: Endoscopy;  Laterality: N/A;  . HEMIARTHROPLASTY HIP Right 2012  . LAPAROSCOPIC CHOLECYSTECTOMY  2001  . LUMBAR FUSION  03/2013  . LUMBAR LAMINECTOMY/DECOMPRESSION MICRODISCECTOMY Right 11/04/2015   Procedure: Right Lumbar Five-Sacral One Microdiskectomy;  Surgeon: Kristeen Miss, MD;  Location: [place] [facility];  Service: Neurosurgery;  Laterality: Right;  Right L5-S1 Microdiskectomy  . moles removed that required stiches     one on leg and one on face  . SUBXYPHOID PERICARDIAL WINDOW N/A 03/08/2019   Procedure: SUBXYPHOID PERICARDIAL WINDOW;  Surgeon: Ivin Poot, MD;  Location: [place];  Service: Thoracic;  Laterality: N/A;  .  TEE WITHOUT CARDIOVERSION N/A 03/08/2019   Procedure: TRANSESOPHAGEAL ECHOCARDIOGRAM (TEE);  Surgeon: Prescott Gum, Collier Salina, MD;  Location: [place];  Service: Thoracic;  Laterality: N/A;  . TEMPORAL ARTERY BIOPSY / LIGATION Bilateral 2011  . TONSILLECTOMY AND ADENOIDECTOMY     at age 34  . TUBAL LIGATION  ~ 1982  . X-STOP IMPLANTATION  ~ 2010   "lower back" (10/28/2012)    Family History  Problem Relation Age of Onset  . Brain cancer Mother 86  . Heart attack Father   . Breast cancer Daughter 76  . Heart disease Other   . Hypertension Other   . Arthritis Neg Hx   . Colon cancer Neg Hx   . Osteoporosis Neg Hx     Social History:  reports that she has never smoked. She has never used smokeless tobacco. She reports current alcohol use. She reports that she does not use drugs.  Allergies:  Allergies  Allergen Reactions  . Dextromethorphan Rash    Medications: I have reviewed the patient's current medications.  Results for orders placed or performed during the hospital encounter of 04/15/19 (from the past 48 hour(s))  Blood Culture (routine x 2)     Status: None (Preliminary result)   Collection Time: 04/15/19 10:54 AM   Specimen: BLOOD  Result Value Ref Range   Specimen Description BLOOD RIGHT ANTECUBITAL    Special Requests      BOTTLES DRAWN AEROBIC AND ANAEROBIC Blood Culture adequate volume   Culture      NO GROWTH 2 DAYS Performed at [facility] [address] [place], [place] [address]    Report Status PENDING   Ammonia     Status: None   Collection Time: 04/15/19 11:34 AM  Result Value Ref Range   Ammonia 28 9 - 35 umol/L    Comment: Performed at [facility], [place] [address] [place], [place] [address]  I-Stat Chem 8, ED     Status: Abnormal   Collection Time: 04/15/19 11:48 AM  Result Value Ref Range   Sodium 139 135 - 145 mmol/L   Potassium 2.6 (LL) 3.5 - 5.1 mmol/L   Chloride 101 98 - 111 mmol/L   BUN 10 8 - 23 mg/dL   Creatinine, Ser 0.80 0.44 -  1.00 mg/dL   Glucose, Bld 122 (H) 70 - 99 mg/dL   Calcium, Ion 0.94 (L) 1.15 - 1.40 mmol/L   TCO2 28 22 - 32 mmol/L   Hemoglobin 13.6 12.0 - 15.0 g/dL   HCT 40.0 36.0 - 46.0 %   Comment NOTIFIED PHYSICIAN   POC SARS Coronavirus 2 Ag-ED - Nasal Swab (BD Veritor Kit)     Status: None   Collection Time: 04/15/19 11:59 AM  Result Value Ref Range   SARS Coronavirus 2 Ag NEGATIVE NEGATIVE    Comment: (  NOTE) SARS-CoV-2 antigen NOT DETECTED.  Negative results are presumptive.  Negative results do not preclude SARS-CoV-2 infection and should not be used as the sole basis for treatment or other patient management decisions, including infection  control decisions, particularly in the presence of clinical signs and  symptoms consistent with COVID-19, or in those who have been in contact with the virus.  Negative results must be combined with clinical observations, patient history, and epidemiological information. The expected result is Negative. Fact Sheet for Patients: PodPark.tn Fact Sheet for Healthcare Providers: GiftContent.is This test is not yet approved or cleared by the [place] FDA and  has been authorized for detection and/or diagnosis of SARS-CoV-2 by FDA under an Emergency Use Authorization (EUA).  This EUA will remain in effect (meaning this test can be used) for the duration of  the COVID-19 de claration under Section 564(b)(1) of the Act, 21 U.S.C. section 360bbb-3(b)(1), unless the authorization is terminated or revoked sooner.   Troponin I (High Sensitivity)     Status: Abnormal   Collection Time: 04/15/19  1:31 PM  Result Value Ref Range   Troponin I (High Sensitivity) 174 (HH) <18 ng/L    Comment: CRITICAL RESULT CALLED TO, READ BACK BY AND VERIFIED WITH: Jene Every RN AT 7078 04/15/19 BY WOOLLEN (NOTE) Elevated high sensitivity troponin I (hsTnI) values and significant  changes across serial  measurements may suggest ACS but many other  chronic and acute conditions are known to elevate hsTnI results.  Refer to the Links section for chest pain algorithms and additional  guidance. Performed at [facility], [place] [address], [place], [place] [address]   Lactic acid, plasma     Status: Abnormal   Collection Time: 04/15/19  1:32 PM  Result Value Ref Range   Lactic Acid, Venous 2.6 (HH) 0.5 - 1.9 mmol/L    Comment: CRITICAL VALUE NOTED.  VALUE IS CONSISTENT WITH PREVIOUSLY REPORTED AND CALLED VALUE. Performed at [facility], [place] [address], [place], [place] [address]   Bilirubin, direct     Status: Abnormal   Collection Time: 04/15/19  3:58 PM  Result Value Ref Range   Bilirubin, Direct 0.6 (H) 0.0 - 0.2 mg/dL    Comment: Performed at [facility] [address], [place], [place] [address]  Urinalysis, Routine w reflex microscopic     Status: Abnormal   Collection Time: 04/15/19  4:49 PM  Result Value Ref Range   Color, Urine YELLOW YELLOW   APPearance CLEAR CLEAR   Specific Gravity, Urine >1.046 (H) 1.005 - 1.030   pH 6.0 5.0 - 8.0   Glucose, UA NEGATIVE NEGATIVE mg/dL   Hgb urine dipstick MODERATE (A) NEGATIVE   Bilirubin Urine NEGATIVE NEGATIVE   Ketones, ur NEGATIVE NEGATIVE mg/dL   Protein, ur NEGATIVE NEGATIVE mg/dL   Nitrite NEGATIVE NEGATIVE   Leukocytes,Ua NEGATIVE NEGATIVE   RBC / HPF 0-5 0 - 5 RBC/hpf   WBC, UA 0-5 0 - 5 WBC/hpf   Bacteria, UA RARE (A) NONE SEEN    Comment: Performed at [facility] [address], [place], [place] [address]  Urine culture     Status: None   Collection Time: 04/15/19  4:49 PM   Specimen: In/Out Cath Urine  Result Value Ref Range   Specimen Description IN/OUT CATH URINE    Special Requests NONE    Culture      NO GROWTH Performed at [facility], [place]  [address], [place], [place] [address]    Report Status 04/16/2019 FINAL   Sedimentation rate     Status: Abnormal   Collection Time:  04/15/19  4:50 PM  Result Value Ref Range   Sed Rate 25 (H) 0 - 22 mm/hr    Comment: Performed at [facility] [address] [place], [place] [address]  C-reactive protein     Status: Abnormal   Collection Time: 04/15/19  4:50 PM  Result Value Ref Range   CRP 17.4 (H) <1.0 mg/dL    Comment: Performed at [facility], [place] [address], [place], [place] [address]  Basic metabolic panel     Status: Abnormal   Collection Time: 04/15/19  6:40 PM  Result Value Ref Range   Sodium 138 135 - 145 mmol/L   Potassium 3.3 (L) 3.5 - 5.1 mmol/L    Comment: DELTA CHECK NOTED   Chloride 103 98 - 111 mmol/L   CO2 23 22 - 32 mmol/L   Glucose, Bld 104 (H) 70 - 99 mg/dL   BUN 11 8 - 23 mg/dL   Creatinine, Ser 1.03 (H) 0.44 - 1.00 mg/dL   Calcium 7.6 (L) 8.9 - 10.3 mg/dL   GFR calc non Af Amer 51 (L) >60 mL/min   GFR calc Af Amer 59 (L) >60 mL/min   Anion gap 12 5 - 15    Comment: Performed at [facility], [place] [address], [place], [place] [address]  CK     Status: None   Collection Time: 04/15/19  6:40 PM  Result Value Ref Range   Total CK 78 38 - 234 U/L    Comment: Performed at [facility], [place] [address], [place], [place] [address]  Troponin I (High Sensitivity)     Status: Abnormal   Collection Time: 04/15/19  6:40 PM  Result Value Ref Range   Troponin I (High Sensitivity) 312 (HH) <18 ng/L    Comment: CRITICAL VALUE NOTED.  VALUE IS CONSISTENT WITH PREVIOUSLY REPORTED AND CALLED VALUE. (NOTE) Elevated high sensitivity troponin I (hsTnI) values and significant  changes across serial measurements may suggest ACS but many other  chronic and acute conditions are known to elevate hsTnI results.  Refer to the Links section for chest pain algorithms and additional  guidance. Performed at [facility], [place] [address] [place],  [address]   CBC with Differential/Platelet Tomorrow     Status: Abnormal   Collection Time: 04/16/19  3:46 AM  Result Value Ref  Range   WBC 8.9 4.0 - 10.5 K/uL   RBC 2.50 (L) 3.87 - 5.11 MIL/uL   Hemoglobin 7.4 (L) 12.0 - 15.0 g/dL    Comment: REPEATED TO VERIFY   HCT 24.3 (L) 36.0 - 46.0 %   MCV 97.2 80.0 - 100.0 fL   MCH 29.6 26.0 - 34.0 pg   MCHC 30.5 30.0 - 36.0 g/dL   RDW 15.8 (H) 11.5 - 15.5 %   Platelets 112 (L) 150 - 400 K/uL    Comment: REPEATED TO VERIFY PLATELET COUNT CONFIRMED BY SMEAR Immature Platelet Fraction may be clinically indicated, consider ordering this additional test [id number]    nRBC 0.0 0.0 - 0.2 %   Neutrophils Relative % 75 %   Neutro Abs 6.7 1.7 - 7.7 K/uL   Lymphocytes Relative 12 %   Lymphs Abs 1.1 0.7 - 4.0 K/uL   Monocytes Relative 12 %   Monocytes Absolute 1.1 (H) 0.1 -  1.0 K/uL   Eosinophils Relative 0 %   Eosinophils Absolute 0.0 0.0 - 0.5 K/uL   Basophils Relative 0 %   Basophils Absolute 0.0 0.0 - 0.1 K/uL   Immature Granulocytes 1 %   Abs Immature Granulocytes 0.05 0.00 - 0.07 K/uL    Comment: Performed at [facility] [address], [place], [place] [address]  Comprehensive metabolic panel     Status: Abnormal   Collection Time: 04/16/19  7:06 AM  Result Value Ref Range   Sodium 142 135 - 145 mmol/L   Potassium 4.2 3.5 - 5.1 mmol/L   Chloride 109 98 - 111 mmol/L   CO2 21 (L) 22 - 32 mmol/L   Glucose, Bld 125 (H) 70 - 99 mg/dL   BUN 14 8 - 23 mg/dL   Creatinine, Ser 1.06 (H) 0.44 - 1.00 mg/dL   Calcium 8.0 (L) 8.9 - 10.3 mg/dL   Total Protein 5.3 (L) 6.5 - 8.1 g/dL   Albumin 2.4 (L) 3.5 - 5.0 g/dL   AST 29 15 - 41 U/L   ALT 26 0 - 44 U/L   Alkaline Phosphatase 79 38 - 126 U/L   Total Bilirubin 2.1 (H) 0.3 - 1.2 mg/dL   GFR calc non Af Amer 49 (L) >60 mL/min   GFR calc Af Amer 57 (L) >60 mL/min   Anion gap 12 5 - 15    Comment: Performed at [facility], [place] [address] [place], [place] [address]  Magnesium     Status: Abnormal   Collection Time: 04/16/19  7:06 AM  Result Value Ref Range   Magnesium 1.3 (L) 1.7 - 2.4 mg/dL    Comment:  Performed at [facility] [address], [place],  [address]  Phosphorus     Status: None   Collection Time: 04/16/19  7:06 AM  Result Value Ref Range   Phosphorus 4.2 2.5 - 4.6 mg/dL    Comment: Performed at [facility] [address] [place], [place] [address]  Troponin I (High Sensitivity)     Status: Abnormal   Collection Time: 04/16/19  7:06 AM  Result Value Ref Range   Troponin I (High Sensitivity) 168 (HH) <18 ng/L    Comment: CRITICAL VALUE NOTED.  VALUE IS CONSISTENT WITH PREVIOUSLY REPORTED AND CALLED VALUE. (NOTE) Elevated high sensitivity troponin I (hsTnI) values and significant  changes across serial measurements may suggest ACS but many other  chronic and acute conditions are known to elevate hsTnI results.  Refer to the Links section for chest pain algorithms and additional  guidance. Performed at [facility], [place] [address] [place],  [address]   CBC     Status: Abnormal   Collection Time: 04/16/19  7:06 AM  Result Value Ref Range   WBC 15.5 (H) 4.0 - 10.5 K/uL   RBC 3.51 (L) 3.87 - 5.11 MIL/uL   Hemoglobin 10.6 (L) 12.0 - 15.0 g/dL    Comment: REPEATED TO VERIFY DISCUSSED WITH K.SNEAD RN [id number] MCCORMICK K    HCT 33.5 (L) 36.0 - 46.0 %   MCV 95.4 80.0 - 100.0 fL   MCH 30.2 26.0 - 34.0 pg   MCHC 31.6 30.0 - 36.0 g/dL   RDW 15.9 (H) 11.5 - 15.5 %   Platelets 146 (L) 150 - 400 K/uL    Comment: REPEATED TO VERIFY   nRBC 0.0 0.0 - 0.2 %    Comment: Performed at [facility], [place] [address] [place], [place] [address]  MRSA PCR Screening     Status: None   Collection Time: 04/16/19 12:33 PM   Specimen: Nasal Mucosa; Nasopharyngeal  Result Value Ref Range   MRSA by PCR NEGATIVE NEGATIVE    Comment:        The GeneXpert MRSA Assay (FDA approved for NASAL specimens only), is one component of a comprehensive MRSA colonization surveillance program. It is not intended to diagnose MRSA infection nor to guide  or monitor treatment for MRSA infections. Performed at [facility], [place] [address] [place], [place] [address]   Vitamin B12     Status: Abnormal   Collection Time: 04/17/19  2:43 AM  Result Value Ref Range   Vitamin B-12 1,101 (H) 180 - 914 pg/mL    Comment: Performed at [facility], [place] [address] [place], [place] [address]  Folate     Status: None   Collection Time: 04/17/19  2:43 AM  Result Value Ref Range   Folate 13.4 >5.9 ng/mL    Comment: Performed at [facility] [address] [place], [place] [address]  Iron and TIBC     Status: Abnormal   Collection Time: 04/17/19  2:43 AM  Result Value Ref Range   Iron 10 (L) 28 - 170 ug/dL   TIBC 218 (L) 250 - 450 ug/dL   Saturation Ratios 5 (L) 10.4 - 31.8 %   UIBC 208 ug/dL    Comment: Performed at [facility], [place] [address], [place], [place] [address]  Ferritin     Status: None   Collection Time: 04/17/19  2:43 AM  Result Value Ref Range   Ferritin 238 11 - 307 ng/mL    Comment: Performed at [facility], [place] [address] [place], [place] [address]  Reticulocytes     Status: Abnormal   Collection Time: 04/17/19  2:43 AM  Result Value Ref Range   Retic Ct Pct 2.3 0.4 - 3.1 %   RBC. 3.08 (L) 3.87 - 5.11 MIL/uL   Retic Count, Absolute 70.2 19.0 - 186.0 K/uL   Immature Retic Fract 18.2 (H) 2.3 - 15.9 %    Comment: Performed at [facility] [address] [place],  [address]  CBC     Status: Abnormal   Collection Time: 04/17/19  7:59 AM  Result Value Ref Range   WBC 15.6 (H) 4.0 - 10.5 K/uL   RBC 3.14 (L) 3.87 - 5.11 MIL/uL   Hemoglobin 9.3 (L) 12.0 - 15.0 g/dL   HCT 29.5 (L) 36.0 - 46.0 %   MCV 93.9 80.0 - 100.0 fL   MCH 29.6 26.0 - 34.0 pg   MCHC 31.5 30.0 - 36.0 g/dL   RDW 15.3 11.5 - 15.5 %   Platelets 150 150 - 400 K/uL   nRBC 0.0 0.0 - 0.2 %    Comment: Performed at [facility], [place] [address] [place],  [address]  Basic metabolic panel     Status:  Abnormal   Collection Time: 04/17/19  7:59 AM  Result Value Ref Range   Sodium 143 135 - 145 mmol/L   Potassium 4.4 3.5 - 5.1 mmol/L   Chloride 109 98 - 111 mmol/L   CO2 24 22 - 32 mmol/L   Glucose, Bld 126 (H) 70 - 99 mg/dL   BUN 19 8 - 23 mg/dL   Creatinine, Ser 0.94 0.44 -  1.00 mg/dL   Calcium 8.8 (L) 8.9 - 10.3 mg/dL   GFR calc non Af Amer 56 (L) >60 mL/min   GFR calc Af Amer >60 >60 mL/min   Anion gap 10 5 - 15    Comment: Performed at [facility] [address] [place], [place] [address]  Magnesium     Status: None   Collection Time: 04/17/19  7:59 AM  Result Value Ref Range   Magnesium 2.3 1.7 - 2.4 mg/dL    Comment: Performed at [facility] [address] [place], [place] [address]    CT Angio Chest PE W and/or Wo Contrast  Result Date: 04/15/2019 CLINICAL DATA:  Shortness of breath.  Nausea and vomiting. EXAM: CT ANGIOGRAPHY CHEST CT ABDOMEN AND PELVIS WITH CONTRAST TECHNIQUE: Multidetector CT imaging of the chest was performed using the standard protocol during bolus administration of intravenous contrast. Multiplanar CT image reconstructions and MIPs were obtained to evaluate the vascular anatomy. Multidetector CT imaging of the abdomen and pelvis was performed using the standard protocol during bolus administration of intravenous contrast. CONTRAST:  174m OMNIPAQUE IOHEXOL 350 MG/ML SOLN COMPARISON:  Abdominal CT scan 03/06/2019 the FINDINGS: CTA CHEST FINDINGS Cardiovascular: The heart is normal in size and stable. There is stable prominent pericardial fat and a small persistent pericardial effusion. The aorta is normal in caliber. Scattered atherosclerotic calcifications. Stable coronary artery calcifications. The pulmonary arterial tree is well opacified. No filling defects to suggest pulmonary embolism. Mediastinum/Nodes: Stable scattered mediastinal and hilar lymph nodes and scattered calcified nodes. The esophagus is grossly normal. Lungs/Pleura: Mosaic  pattern of ground-glass attenuation in the lungs diffusely likely due to small airways disease such as asthma, constrictive, acute or respiratory bronchiolitis, hypersensitivity pneumonitis or cryptogenic organizing pneumonia. No focal infiltrates or worrisome pulmonary lesions. There are patchy areas of subsegmental atelectasis and a small right pleural effusion. Musculoskeletal: No breast masses, supraclavicular or axillary adenopathy. The bony thorax is intact. Vertebral augmentation changes are noted at T11. Review of the MIP images confirms the above findings. CT ABDOMEN and PELVIS FINDINGS Hepatobiliary: No focal hepatic lesions are identified. There is mild intra and extrahepatic biliary dilatation likely due to prior cholecystectomy. The portal and hepatic veins are patent. Pancreas: No mass, inflammation or ductal dilatation. Stable moderate atrophy. Spleen: Normal size.  Small calcified granulomas. Adrenals/Urinary Tract: The adrenal glands and kidneys are unremarkable. The bladder appears normal. Stomach/Bowel: The stomach, duodenum, small bowel and colon are grossly normal. No acute inflammatory changes, mass lesions or obstructive findings. The terminal ileum and appendix are normal. Descending colon and sigmoid colon diverticulosis but no findings for acute diverticulitis. Vascular/Lymphatic: Advanced atherosclerotic calcifications involving the aorta and iliac arteries. No aneurysm or dissection. The branch vessels are patent. The major venous structures are patent. No mesenteric or retroperitoneal mass or adenopathy. Small scattered lymph nodes are stable. Reproductive: Surgically absent. Other: No pelvic mass or free pelvic fluid collections. No inguinal mass or adenopathy. Musculoskeletal: A stable appearing right hip prosthesis. Advanced degenerative changes involving the left hip. Postoperative changes involving the spine with marked lytic appearance of L2, L3 and L4. T12 also appears somewhat  expanded and lytic. There is also a aggressive lytic lesion involving the left ischium and marked periostitis. Findings suspicious for osseous metastatic disease. Review of the MIP images confirms the above findings. IMPRESSION: 1. No CT findings for pulmonary embolism. 2. No aortic aneurysm or dissection. 3. Persistent small pericardial effusion and prominent pericardial fat. 4. Mosaic pattern  of ground-glass attenuation throughout the lungs most likely small airways disease. Please see above discussion. 5. Small right pleural effusion and overlying atelectasis. 6. No acute abdominal/pelvic findings, mass lesions or adenopathy. 7. Prior small nondisplaced fracture involving the left ischium now appears to be an aggressive lytic process with marked periostitis. There is also marked lucency of the L2, L3 and L4 vertebral bodies and possibly T12. Could not exclude metastatic disease. Electronically Signed   By: Marijo Sanes M.D.   On: 04/15/2019 13:50   MR THORACIC SPINE W WO CONTRAST  Result Date: 04/16/2019 CLINICAL DATA:  Back pain. Abnormal CT EXAM: MRI THORACIC WITHOUT AND WITH CONTRAST TECHNIQUE: Multiplanar and multiecho pulse sequences of the thoracic spine were obtained without and with intravenous contrast. CONTRAST:  13m GADAVIST GADOBUTROL 1 MMOL/ML IV SOLN COMPARISON:  CT 04/15/2019 FINDINGS: MRI THORACIC SPINE FINDINGS Alignment: Thoracic vertebral alignment is maintained without significant listhesis. Localizer sequences which include the cervical spine demonstrate grade 1 anterolisthesis C3 on C4, C4 on C5, and C5 on C6. Vertebrae: Low T1-T2 signal changes within the T11 and L1 vertebral segments compatible with prior cement augmentation. No acute fractures. No suspicious bone lesion. Scattered intraosseous hemangiomas including within the T8 and T9 vertebrae. No evidence of discitis. Cord: Increased cord signal at T10-11 and T11-12 where there is high-grade canal stenosis and cord compression  (series 36, image 29; series 35, image 9). The remaining thoracic cord is otherwise normal in signal and caliber. Paraspinal and other soft tissues: Small right pleural effusion. Disc levels: T1-T2: Mild left foraminal narrowing secondary to facet hypertrophy. No canal stenosis. T2-T3: Mild diffuse disc bulge without foraminal or canal stenosis. T3-T4: Mild diffuse disc bulge without foraminal or canal stenosis. T4-T5: No significant canal or neural foraminal narrowing T5-T6: No significant canal or neural foraminal narrowing T6-T7: No significant canal or neural foraminal narrowing T7-T8: No significant canal or neural foraminal narrowing T8-T9: No significant canal or neural foraminal narrowing T9-T10: No significant canal or neural foraminal narrowing T10-T11: Left paracentral disc bulge and posterior element hypertrophy result in moderate to severe canal stenosis with compression of the cord and increased T2 cord signal. There is moderate to severe left and mild right foraminal stenosis. T11-T12: Diffuse disc bulge, eccentric to the left, posterior element hypertrophy, and prominent posterior epidural fat contribute to moderate to severe canal stenosis with compression of the cord and increased T2 cord signal. Severe left foraminal stenosis. IMPRESSION: 1. 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 2. Severe left foraminal stenosis at T11-T12 and severe left foraminal stenosis at T11-T12. 3. No acute fracture or suspicious marrow replacing lesion. 4. Small right pleural effusion. Electronically Signed   By: NDavina PokeD.O.   On: 04/16/2019 12:47   MR Lumbar Spine W Wo Contrast  Addendum Date: 04/16/2019   ADDENDUM REPORT: 04/16/2019 13:12 ADDENDUM: Findings were discussed via telephone with Dr. RTyrell Antonioat 1:10 p.m. on 04/16/2019 by Dr. NDavina Poke Electronically  Signed   By: NDavina PokeD.O.   On: 04/16/2019 13:12   Result Date: 04/16/2019 CLINICAL DATA:  Abnormal appearance of the lumbar spine on recent CT. Concern for osseous metastatic disease. History of lumbar fusion EXAM: MRI LUMBAR SPINE WITHOUT AND WITH CONTRAST TECHNIQUE: Multiplanar and multiecho pulse sequences of the lumbar spine were obtained without and with intravenous contrast. CONTRAST:  714mGADAVIST GADOBUTROL 1 MMOL/ML IV SOLN COMPARISON:  CT 04/15/2019. MRI 08/13/2016 FINDINGS: Segmentation:  Standard. Alignment:  Lumbar levocurvature. Trace retrolisthesis L1 on L2. Vertebrae: Prior posterior instrumented fusion and decompression at L2-L4. Susceptibility artifact related to hardware slightly degrades evaluation of the adjacent structures. Low T1/T2 signal related to prior cement augmentation within the L1 and T11 vertebral bodies. Bone marrow signal is mildly heterogeneous and largely fatty replaced. No evidence of fracture, marrow replacing lesion, or discitis. Conus medullaris and cauda equina: Conus extends to the L1-L2 level. Cauda equina appear normal. The lower cord is mildly increased in signal the T11-12 through T12-L1 levels. Paraspinal and other soft tissues: Negative. Disc levels: T10-T11: Sagittal sequences only. Diffuse disc bulge and posterior element hypertrophy results in moderate to severe canal stenosis with evidence the cord compression (series 9, image 8). T11-T12: Sagittal sequences only. Diffuse disc bulge and posterior element hypertrophy result in moderate to severe canal stenosis with evidence of cord compression (series 9, image 8). T12-L1: Diffuse disc bulge and posterior element hypertrophy result in moderate canal stenosis and mild-to-moderate bilateral foraminal stenosis, left worse than right. L1-L2: Slight retropulsion and endplate ridging with mild posterior element hypertrophy resulting in mild-to-moderate right foraminal stenosis and mild canal stenosis. L2-L3:  Prior fusion and decompression without evidence of significant residual foraminal or canal stenosis. L3-L4: Prior fusion and decompression without evidence of significant residual foraminal or canal stenosis. L4-L5: Diffuse disc bulge with right paracentral extrusion with 16 mm caudal extension of disc material (series 13, image 8; series 11, image 30). Moderate to severe canal stenosis at this level. Moderate left foraminal stenosis with prominent left-sided facet arthropathy contributing. L5-S1: Mild diffuse disc bulge with endplate ridging and bilateral facet arthropathy. Severe right and mild left foraminal stenosis and mild-to-moderate canal stenosis. IMPRESSION: 1. No acute fracture, marrow replacing lesion, or evidence of discitis. 2. Moderate to severe multifactorial canal stenosis at T10-T11 and T11-12 with evidence of cord compression. Subtly increased STIR signal within the cord at these levels suggestive of compressive myelopathy. 3. Moderate to severe canal stenosis at T12-L1 and L4-5. 4. Multilevel bilateral foraminal stenosis most severe on the right at L5-S1. 5. Prior posterior instrumented fusion and decompression at L2-L3 and L3-L4. No significant residual foraminal or canal stenosis at fused levels. These results will be called to the ordering clinician or representative by the Radiologist Assistant, and communication documented in the PACS or zVision Dashboard. Electronically Signed: By: Davina Poke D.O. On: 04/16/2019 12:28   CT ABDOMEN PELVIS W CONTRAST  Result Date: 04/15/2019 CLINICAL DATA:  Shortness of breath.  Nausea and vomiting. EXAM: CT ANGIOGRAPHY CHEST CT ABDOMEN AND PELVIS WITH CONTRAST TECHNIQUE: Multidetector CT imaging of the chest was performed using the standard protocol during bolus administration of intravenous contrast. Multiplanar CT image reconstructions and MIPs were obtained to evaluate the vascular anatomy. Multidetector CT imaging of the abdomen and pelvis was  performed using the standard protocol during bolus administration of intravenous contrast. CONTRAST:  141m OMNIPAQUE IOHEXOL 350 MG/ML SOLN COMPARISON:  Abdominal CT scan 03/06/2019 the FINDINGS: CTA CHEST FINDINGS Cardiovascular: The heart is normal in size and stable. There is stable prominent pericardial fat and a small persistent pericardial effusion. The aorta is normal in caliber. Scattered atherosclerotic calcifications. Stable coronary artery calcifications. The pulmonary arterial tree is well opacified. No filling defects to suggest pulmonary embolism. Mediastinum/Nodes: Stable scattered mediastinal and hilar lymph nodes and scattered calcified nodes. The esophagus is grossly normal. Lungs/Pleura: Mosaic pattern of ground-glass attenuation in the lungs diffusely likely due to small  airways disease such as asthma, constrictive, acute or respiratory bronchiolitis, hypersensitivity pneumonitis or cryptogenic organizing pneumonia. No focal infiltrates or worrisome pulmonary lesions. There are patchy areas of subsegmental atelectasis and a small right pleural effusion. Musculoskeletal: No breast masses, supraclavicular or axillary adenopathy. The bony thorax is intact. Vertebral augmentation changes are noted at T11. Review of the MIP images confirms the above findings. CT ABDOMEN and PELVIS FINDINGS Hepatobiliary: No focal hepatic lesions are identified. There is mild intra and extrahepatic biliary dilatation likely due to prior cholecystectomy. The portal and hepatic veins are patent. Pancreas: No mass, inflammation or ductal dilatation. Stable moderate atrophy. Spleen: Normal size.  Small calcified granulomas. Adrenals/Urinary Tract: The adrenal glands and kidneys are unremarkable. The bladder appears normal. Stomach/Bowel: The stomach, duodenum, small bowel and colon are grossly normal. No acute inflammatory changes, mass lesions or obstructive findings. The terminal ileum and appendix are normal.  Descending colon and sigmoid colon diverticulosis but no findings for acute diverticulitis. Vascular/Lymphatic: Advanced atherosclerotic calcifications involving the aorta and iliac arteries. No aneurysm or dissection. The branch vessels are patent. The major venous structures are patent. No mesenteric or retroperitoneal mass or adenopathy. Small scattered lymph nodes are stable. Reproductive: Surgically absent. Other: No pelvic mass or free pelvic fluid collections. No inguinal mass or adenopathy. Musculoskeletal: A stable appearing right hip prosthesis. Advanced degenerative changes involving the left hip. Postoperative changes involving the spine with marked lytic appearance of L2, L3 and L4. T12 also appears somewhat expanded and lytic. There is also a aggressive lytic lesion involving the left ischium and marked periostitis. Findings suspicious for osseous metastatic disease. Review of the MIP images confirms the above findings. IMPRESSION: 1. No CT findings for pulmonary embolism. 2. No aortic aneurysm or dissection. 3. Persistent small pericardial effusion and prominent pericardial fat. 4. Mosaic pattern of ground-glass attenuation throughout the lungs most likely small airways disease. Please see above discussion. 5. Small right pleural effusion and overlying atelectasis. 6. No acute abdominal/pelvic findings, mass lesions or adenopathy. 7. Prior small nondisplaced fracture involving the left ischium now appears to be an aggressive lytic process with marked periostitis. There is also marked lucency of the L2, L3 and L4 vertebral bodies and possibly T12. Could not exclude metastatic disease. Electronically Signed   By: Marijo Sanes M.D.   On: 04/15/2019 13:50   DG Chest Port 1 View  Result Date: 04/15/2019 CLINICAL DATA:  Altered mental status. EXAM: PORTABLE CHEST 1 VIEW COMPARISON:  March 29, 2019 FINDINGS: Cardiomediastinal silhouette is enlarged. Mediastinal contours appear intact. There is no  evidence of pleural effusion or pneumothorax. Bilateral lower lobe atelectasis versus mild peribronchial airspace consolidation. Osseous structures are without acute abnormality. Soft tissues are grossly normal. IMPRESSION: 1. Bilateral lower lobe atelectasis versus mild peribronchial airspace consolidation. 2. Enlarged cardiac silhouette. Electronically Signed   By: Fidela Salisbury M.D.   On: 04/15/2019 11:40   US Abdomen Limited RUQ  Result Date: 04/15/2019 CLINICAL DATA:  Elevated liver function tests. EXAM: ULTRASOUND ABDOMEN LIMITED RIGHT UPPER QUADRANT COMPARISON:  CT of the abdomen April 15, 2019 FINDINGS: Gallbladder: Post cholecystectomy.  No focal fluid collection. Common bile duct: Diameter: 6.6 mm Liver: No focal lesion identified. Within normal limits in parenchymal echogenicity. Portal vein is patent on color Doppler imaging with normal direction of blood flow towards the liver. Other: None. IMPRESSION: Normal right upper quadrant ultrasound post cholecystectomy. Electronically Signed   By: Fidela Salisbury M.D.   On: 04/15/2019 16:00    Review of Systems  HENT: Negative for ear discharge, ear pain, hearing loss and tinnitus.   Eyes: Negative for photophobia and pain.  Respiratory: Negative for cough and shortness of breath.   Cardiovascular: Negative for chest pain.  Gastrointestinal: Negative for abdominal pain, nausea and vomiting.  Genitourinary: Negative for dysuria, flank pain, frequency and urgency.  Musculoskeletal: Negative for arthralgias, back pain, myalgias and neck pain.  Neurological: Negative for dizziness and headaches.  Hematological: Does not bruise/bleed easily.  Psychiatric/Behavioral: The patient is not nervous/anxious.    Blood pressure (!) 156/68, pulse 66, temperature 97.7 F (36.5 C), temperature source Oral, resp. rate 17, height 5' 4.5" (1.638 m), weight 70.3 kg, SpO2 97 %. Physical Exam  Constitutional: She appears well-developed and  well-nourished. No distress.  HENT:  Head: Normocephalic and atraumatic.  Eyes: Conjunctivae are normal. Right eye exhibits no discharge. Left eye exhibits no discharge. No scleral icterus.  Cardiovascular: Normal rate and regular rhythm.  Respiratory: Effort normal. No respiratory distress.  Musculoskeletal:     Cervical back: Normal range of motion.     Comments: LLE No traumatic wounds, ecchymosis, or rash  Mild TTP ischium  No knee or ankle effusion  Knee stable to varus/ valgus and anterior/posterior stress  Sens DPN, SPN, TN intact  Motor EHL, ext, flex, evers 5/5  DP 1+, PT 1+, No significant edema  Neurological: She is alert.  Skin: Skin is warm and dry. She is not diaphoretic.  Psychiatric: She has a normal mood and affect. Her behavior is normal.    Assessment/Plan: Left ischial fx -- Will get MRI w/wo to start. May be WBAT on LLE. Multiple medical problems including CKD (stage 3), CAD, stroke, temporal arteritis, and RA on chronic steroid -- per primary service    Lisette Abu, PA-C Orthopedic Surgery [phone] 04/17/2019, 10:52 AM   Discussed, reviewed images, agree with above.  I am not clear if the ischial fracture is a pathologic lesion versus a fracture, but I am concerned about a pathologic lesion although I do not believe that she has a known primary source.  MRI with and without contrast I believe will provide additional information as to the etiology, I would not resort to bone biopsy unless absolutely necessary, will review the results of the MRI and make more definitive treatment recommendations thereafter.  Marchia Bond, MD

## 2019-04-17 NOTE — Progress Notes (Signed)
PROGRESS NOTE    Betty Jackson  RSW:[id number] DOB: 04-28-1934 DOA: 04/15/2019 PCP: Eulas Post, MD   Brief Narrative: 85 year old with past medical history significant for CKD stage III, CAD, stroke, temporal arteritis, RA on chronic steroids, status post L2-3, L3-L4 posterior fusion by Dr. Ellene Route in 2015 and L5-S1 microdiscectomy by Dr. Ellene Route  in 2017.  patient daughter noted that patient has been limping around, patient complaining of worsening back pain.  Patient was found to be lethargic and confused by daughter the morning of admission.  Patient has been vomited in the bed.  Evaluation in the ED CT abdomen and pelvis revealed a prior  small nondisplaced fracture involving the left ischium now appears to be an aggressive lytic process with marked periostitis.  There is also marked lucency of L2, L3 and L4 vertebral bodies and possible T12.   Patient was found to be febrile temperature 101.9, tachycardic heart rate 125, respiration rate 37, blood pressure 100/50.  Plan my elevation of troponins, CRP at 17, ESR 25, UA 0-5 white blood cell.  Right upper quadrant ultrasound post cholecystectomy.  Assessment & Plan:   Active Problems:   Sepsis ([place])   Estimated body mass index is 26.19 kg/m as calculated from the following:   Height as of this encounter: 5' 4.5" (1.638 m).   Weight as of this encounter: 70.3 kg.  1-Sepsis; Possible related to  PNA UA with 0-5 white blood cell.  Follow urine culture Ultrasound negative. COVID-19 negative. Follow blood cultures. No growth to date.  MRI to rule out infectious process of the spine. Continue with IV antibiotics. Cefepime and Azithromycin.  CT angio Lungs; Mosaic pattern of ground-glass attenuation in the lungs diffusely likely due to small airways disease such as asthma, constrictive, acute or respiratory bronchiolitis, hypersensitivity pneumonitis or cryptogenic organizing pneumonia WBC stable at 15, afebrile.   2- Abnormal Left  Ischium Lytic lesions ? and Lumbar spine.  -Prior small nondisplaced fracture involving the left ischium now appears to be an aggressive lytic process with marked periostitis. There is also marked lucency of the L2, L3 and L4 vertebral bodies and possibly T12. Could not exclude metastatic disease. -Neurosurgery consulted. Per Dr Arnoldo Morale no indication for urgent spinal Surgery. Dr. Ellene Route to follow up with patient.  -MRI thoracic and lumbar spine negative for infection or bone lytic lesions. She has multi level spinal stenosis and cord compression T 11-12. Left Message to Dr Ellene Route to see patient or contact Daughter.  -Orthopedic consulted, recommend MRI pelvis with and without contrast to further evaluate.   3-Anemia: Prior hemoglobin per records around 9.7. Hb on admission  at 12, suspect hemoconcentration. Hb stable at 9. Started ferrous sulfate.   4-Acute metabolic encephalopathy: Related to acute infectious process. Ammonia level normal. Patient alert and oriented, back to baseline.   5-Elevation of troponin: Patient denies chest pain. Troponins trending down.  2D ECHO ordered.   6-Hypomagnesemia: Replaced. Mg at 2.3  7-Chronic A fib;  Resume Cardizem and oral metoprolol, amiodarone.  Resume Eliquis.    CKD stage III; stable, cr.   RA; On stress dose steroids currently.   DVT prophylaxis: Eliquis.  Code Status: DNR Family Communication: Daughter at bedside.  Disposition Plan: Plan to proceed with Pelvis MRI, continue with IV antibiotics, PT evaluation. ECHO ordered.  Consultants:  Neurosurgery  Procedures:  [place]; Normal right upper quadrant ultrasound post cholecystectomy.   Antimicrobials:  Vancomycin 1-16 Cefepime 1-16  Subjective: Alert, oriented times 3. She report she has  history of vocal cord dysfunction diagnose many years ago.  She report chronic cough. She is breathing well.  No further vomiting.   Objective: Vitals:   04/16/19 1955 04/16/19 2322  04/17/19 0338 04/17/19 0825  BP: 131/73 114/62 124/72 (!) 156/68  Pulse: 65 74 68 66  Resp: 16 (!) '21 17 17  ' Temp: (!) 97.5 F (36.4 C) 97.8 F (36.6 C) 98.2 F (36.8 C) 97.7 F (36.5 C)  TempSrc: Oral Oral Oral Oral  SpO2: 100% 97% 98% 97%  Weight:      Height:        Intake/Output Summary (Last 24 hours) at 04/17/2019 0949 Last data filed at 04/16/2019 1533 Gross per 24 hour  Intake 735.02 ml  Output 500 ml  Net 235.02 ml   Filed Weights   04/15/19 1048  Weight: 70.3 kg    Examination:  General exam: Alert, NAD Respiratory system: CTA Cardiovascular system: S 1, S 2 RRR Gastrointestinal system: BS present, soft, nt Central nervous system; Alert, following command, moves all 4 extremities, no significant weakness LE.  Extremities: Trace edema     Data Reviewed: I have personally reviewed following labs and imaging studies  CBC: Recent Labs  Lab 04/15/19 1052 04/15/19 1148 04/16/19 0346 04/16/19 0706 04/17/19 0759  WBC 15.9*  --  8.9 15.5* 15.6*  NEUTROABS 11.9*  --  6.7  --   --   HGB 12.8 13.6 7.4* 10.6* 9.3*  HCT 41.1 40.0 24.3* 33.5* 29.5*  MCV 92.8  --  97.2 95.4 93.9  PLT 204  --  112* 146* 287   Basic Metabolic Panel: Recent Labs  Lab 04/15/19 1052 04/15/19 1148 04/15/19 1840 04/16/19 0706 04/17/19 0759  NA 140 139 138 142 143  K 2.7* 2.6* 3.3* 4.2 4.4  CL 96* 101 103 109 109  CO2 29  --  23 21* 24  GLUCOSE 136* 122* 104* 125* 126*  BUN '11 10 11 14 19  ' CREATININE 1.01* 0.80 1.03* 1.06* 0.94  CALCIUM 8.9  --  7.6* 8.0* 8.8*  MG  --   --   --  1.3* 2.3  PHOS  --   --   --  4.2  --    GFR: Estimated Creatinine Clearance: 44.9 mL/min (by C-G formula based on SCr of 0.94 mg/dL). Liver Function Tests: Recent Labs  Lab 04/15/19 1052 04/16/19 0706  AST 46* 29  ALT 36 26  ALKPHOS 101 79  BILITOT 2.5* 2.1*  PROT 6.3* 5.3*  ALBUMIN 3.3* 2.4*   Recent Labs  Lab 04/15/19 1052  LIPASE 28   Recent Labs  Lab 04/15/19 1134  AMMONIA  28   Coagulation Profile: Recent Labs  Lab 04/15/19 1052  INR 1.3*   Cardiac Enzymes: Recent Labs  Lab 04/15/19 1840  CKTOTAL 78   BNP (last 3 results) No results for input(s): PROBNP in the last 8760 hours. HbA1C: No results for input(s): HGBA1C in the last 72 hours. CBG: No results for input(s): GLUCAP in the last 168 hours. Lipid Profile: No results for input(s): CHOL, HDL, LDLCALC, TRIG, CHOLHDL, LDLDIRECT in the last 72 hours. Thyroid Function Tests: No results for input(s): TSH, T4TOTAL, FREET4, T3FREE, THYROIDAB in the last 72 hours. Anemia Panel: Recent Labs    04/17/19 0243  VITAMINB12 1,101*  FOLATE 13.4  FERRITIN 238  TIBC 218*  IRON 10*  RETICCTPCT 2.3   Sepsis Labs: Recent Labs  Lab 04/15/19 1052 04/15/19 1332  LATICACIDVEN 2.4* 2.6*    Recent  Results (from the past 240 hour(s))  Blood Culture (routine x 2)     Status: None (Preliminary result)   Collection Time: 04/15/19 10:52 AM   Specimen: BLOOD  Result Value Ref Range Status   Specimen Description BLOOD LEFT ANTECUBITAL  Final   Special Requests   Final    BOTTLES DRAWN AEROBIC AND ANAEROBIC Blood Culture results may not be optimal due to an inadequate volume of blood received in culture bottles   Culture   Final    NO GROWTH < 24 HOURS Performed at [facility], [place] [address], [place], [place] [address]    Report Status PENDING  Incomplete  Blood Culture (routine x 2)     Status: None (Preliminary result)   Collection Time: 04/15/19 10:54 AM   Specimen: BLOOD  Result Value Ref Range Status   Specimen Description BLOOD RIGHT ANTECUBITAL  Final   Special Requests   Final    BOTTLES DRAWN AEROBIC AND ANAEROBIC Blood Culture adequate volume   Culture   Final    NO GROWTH < 24 HOURS Performed at [facility], [place] [address] [place], [place] [address]    Report Status PENDING  Incomplete  Urine culture     Status: None   Collection Time: 04/15/19  4:49 PM   Specimen:  In/Out Cath Urine  Result Value Ref Range Status   Specimen Description IN/OUT CATH URINE  Final   Special Requests NONE  Final   Culture   Final    NO GROWTH Performed at [facility], [place] [address], [place], [place] [address]    Report Status 04/16/2019 FINAL  Final  MRSA PCR Screening     Status: None   Collection Time: 04/16/19 12:33 PM   Specimen: Nasal Mucosa; Nasopharyngeal  Result Value Ref Range Status   MRSA by PCR NEGATIVE NEGATIVE Final    Comment:        The GeneXpert MRSA Assay (FDA approved for NASAL specimens only), is one component of a comprehensive MRSA colonization surveillance program. It is not intended to diagnose MRSA infection nor to guide or monitor treatment for MRSA infections. Performed at [facility], [place] [address] [place], [place] [address]          Radiology Studies: CT Angio Chest PE W and/or Wo Contrast  Result Date: 04/15/2019 CLINICAL DATA:  Shortness of breath.  Nausea and vomiting. EXAM: CT ANGIOGRAPHY CHEST CT ABDOMEN AND PELVIS WITH CONTRAST TECHNIQUE: Multidetector CT imaging of the chest was performed using the standard protocol during bolus administration of intravenous contrast. Multiplanar CT image reconstructions and MIPs were obtained to evaluate the vascular anatomy. Multidetector CT imaging of the abdomen and pelvis was performed using the standard protocol during bolus administration of intravenous contrast. CONTRAST:  179m OMNIPAQUE IOHEXOL 350 MG/ML SOLN COMPARISON:  Abdominal CT scan 03/06/2019 the FINDINGS: CTA CHEST FINDINGS Cardiovascular: The heart is normal in size and stable. There is stable prominent pericardial fat and a small persistent pericardial effusion. The aorta is normal in caliber. Scattered atherosclerotic calcifications. Stable coronary artery calcifications. The pulmonary arterial tree is well opacified. No filling defects to suggest pulmonary embolism. Mediastinum/Nodes: Stable scattered  mediastinal and hilar lymph nodes and scattered calcified nodes. The esophagus is grossly normal. Lungs/Pleura: Mosaic pattern of ground-glass attenuation in the lungs diffusely likely due to small airways disease such as asthma, constrictive, acute or respiratory bronchiolitis, hypersensitivity pneumonitis or cryptogenic organizing pneumonia. No focal infiltrates or worrisome  pulmonary lesions. There are patchy areas of subsegmental atelectasis and a small right pleural effusion. Musculoskeletal: No breast masses, supraclavicular or axillary adenopathy. The bony thorax is intact. Vertebral augmentation changes are noted at T11. Review of the MIP images confirms the above findings. CT ABDOMEN and PELVIS FINDINGS Hepatobiliary: No focal hepatic lesions are identified. There is mild intra and extrahepatic biliary dilatation likely due to prior cholecystectomy. The portal and hepatic veins are patent. Pancreas: No mass, inflammation or ductal dilatation. Stable moderate atrophy. Spleen: Normal size.  Small calcified granulomas. Adrenals/Urinary Tract: The adrenal glands and kidneys are unremarkable. The bladder appears normal. Stomach/Bowel: The stomach, duodenum, small bowel and colon are grossly normal. No acute inflammatory changes, mass lesions or obstructive findings. The terminal ileum and appendix are normal. Descending colon and sigmoid colon diverticulosis but no findings for acute diverticulitis. Vascular/Lymphatic: Advanced atherosclerotic calcifications involving the aorta and iliac arteries. No aneurysm or dissection. The branch vessels are patent. The major venous structures are patent. No mesenteric or retroperitoneal mass or adenopathy. Small scattered lymph nodes are stable. Reproductive: Surgically absent. Other: No pelvic mass or free pelvic fluid collections. No inguinal mass or adenopathy. Musculoskeletal: A stable appearing right hip prosthesis. Advanced degenerative changes involving the left  hip. Postoperative changes involving the spine with marked lytic appearance of L2, L3 and L4. T12 also appears somewhat expanded and lytic. There is also a aggressive lytic lesion involving the left ischium and marked periostitis. Findings suspicious for osseous metastatic disease. Review of the MIP images confirms the above findings. IMPRESSION: 1. No CT findings for pulmonary embolism. 2. No aortic aneurysm or dissection. 3. Persistent small pericardial effusion and prominent pericardial fat. 4. Mosaic pattern of ground-glass attenuation throughout the lungs most likely small airways disease. Please see above discussion. 5. Small right pleural effusion and overlying atelectasis. 6. No acute abdominal/pelvic findings, mass lesions or adenopathy. 7. Prior small nondisplaced fracture involving the left ischium now appears to be an aggressive lytic process with marked periostitis. There is also marked lucency of the L2, L3 and L4 vertebral bodies and possibly T12. Could not exclude metastatic disease. Electronically Signed   By: Marijo Sanes M.D.   On: 04/15/2019 13:50   MR THORACIC SPINE W WO CONTRAST  Result Date: 04/16/2019 CLINICAL DATA:  Back pain. Abnormal CT EXAM: MRI THORACIC WITHOUT AND WITH CONTRAST TECHNIQUE: Multiplanar and multiecho pulse sequences of the thoracic spine were obtained without and with intravenous contrast. CONTRAST:  33m GADAVIST GADOBUTROL 1 MMOL/ML IV SOLN COMPARISON:  CT 04/15/2019 FINDINGS: MRI THORACIC SPINE FINDINGS Alignment: Thoracic vertebral alignment is maintained without significant listhesis. Localizer sequences which include the cervical spine demonstrate grade 1 anterolisthesis C3 on C4, C4 on C5, and C5 on C6. Vertebrae: Low T1-T2 signal changes within the T11 and L1 vertebral segments compatible with prior cement augmentation. No acute fractures. No suspicious bone lesion. Scattered intraosseous hemangiomas including within the T8 and T9 vertebrae. No evidence of  discitis. Cord: Increased cord signal at T10-11 and T11-12 where there is high-grade canal stenosis and cord compression (series 36, image 29; series 35, image 9). The remaining thoracic cord is otherwise normal in signal and caliber. Paraspinal and other soft tissues: Small right pleural effusion. Disc levels: T1-T2: Mild left foraminal narrowing secondary to facet hypertrophy. No canal stenosis. T2-T3: Mild diffuse disc bulge without foraminal or canal stenosis. T3-T4: Mild diffuse disc bulge without foraminal or canal stenosis. T4-T5: No significant canal or neural foraminal narrowing T5-T6: No significant canal or  neural foraminal narrowing T6-T7: No significant canal or neural foraminal narrowing T7-T8: No significant canal or neural foraminal narrowing T8-T9: No significant canal or neural foraminal narrowing T9-T10: No significant canal or neural foraminal narrowing T10-T11: Left paracentral disc bulge and posterior element hypertrophy result in moderate to severe canal stenosis with compression of the cord and increased T2 cord signal. There is moderate to severe left and mild right foraminal stenosis. T11-T12: Diffuse disc bulge, eccentric to the left, posterior element hypertrophy, and prominent posterior epidural fat contribute to moderate to severe canal stenosis with compression of the cord and increased T2 cord signal. Severe left foraminal stenosis. IMPRESSION: 1. 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 2. Severe left foraminal stenosis at T11-T12 and severe left foraminal stenosis at T11-T12. 3. No acute fracture or suspicious marrow replacing lesion. 4. Small right pleural effusion. Electronically Signed   By: Davina Poke D.O.   On: 04/16/2019 12:47   MR Lumbar Spine W Wo Contrast  Addendum Date: 04/16/2019   ADDENDUM REPORT: 04/16/2019 13:12 ADDENDUM:  Findings were discussed via telephone with Dr. Tyrell Antonio at 1:10 p.m. on 04/16/2019 by Dr. Davina Poke. Electronically Signed   By: Davina Poke D.O.   On: 04/16/2019 13:12   Result Date: 04/16/2019 CLINICAL DATA:  Abnormal appearance of the lumbar spine on recent CT. Concern for osseous metastatic disease. History of lumbar fusion EXAM: MRI LUMBAR SPINE WITHOUT AND WITH CONTRAST TECHNIQUE: Multiplanar and multiecho pulse sequences of the lumbar spine were obtained without and with intravenous contrast. CONTRAST:  46m GADAVIST GADOBUTROL 1 MMOL/ML IV SOLN COMPARISON:  CT 04/15/2019. MRI 08/13/2016 FINDINGS: Segmentation:  Standard. Alignment:  Lumbar levocurvature. Trace retrolisthesis L1 on L2. Vertebrae: Prior posterior instrumented fusion and decompression at L2-L4. Susceptibility artifact related to hardware slightly degrades evaluation of the adjacent structures. Low T1/T2 signal related to prior cement augmentation within the L1 and T11 vertebral bodies. Bone marrow signal is mildly heterogeneous and largely fatty replaced. No evidence of fracture, marrow replacing lesion, or discitis. Conus medullaris and cauda equina: Conus extends to the L1-L2 level. Cauda equina appear normal. The lower cord is mildly increased in signal the T11-12 through T12-L1 levels. Paraspinal and other soft tissues: Negative. Disc levels: T10-T11: Sagittal sequences only. Diffuse disc bulge and posterior element hypertrophy results in moderate to severe canal stenosis with evidence the cord compression (series 9, image 8). T11-T12: Sagittal sequences only. Diffuse disc bulge and posterior element hypertrophy result in moderate to severe canal stenosis with evidence of cord compression (series 9, image 8). T12-L1: Diffuse disc bulge and posterior element hypertrophy result in moderate canal stenosis and mild-to-moderate bilateral foraminal stenosis, left worse than right. L1-L2: Slight retropulsion and endplate ridging with  mild posterior element hypertrophy resulting in mild-to-moderate right foraminal stenosis and mild canal stenosis. L2-L3: Prior fusion and decompression without evidence of significant residual foraminal or canal stenosis. L3-L4: Prior fusion and decompression without evidence of significant residual foraminal or canal stenosis. L4-L5: Diffuse disc bulge with right paracentral extrusion with 16 mm caudal extension of disc material (series 13, image 8; series 11, image 30). Moderate to severe canal stenosis at this level. Moderate left foraminal stenosis with prominent left-sided facet arthropathy contributing. L5-S1: Mild diffuse disc bulge with endplate ridging and bilateral facet arthropathy. Severe right and mild left foraminal stenosis and mild-to-moderate canal stenosis. IMPRESSION: 1. No acute fracture, marrow replacing lesion, or evidence  of discitis. 2. Moderate to severe multifactorial canal stenosis at T10-T11 and T11-12 with evidence of cord compression. Subtly increased STIR signal within the cord at these levels suggestive of compressive myelopathy. 3. Moderate to severe canal stenosis at T12-L1 and L4-5. 4. Multilevel bilateral foraminal stenosis most severe on the right at L5-S1. 5. Prior posterior instrumented fusion and decompression at L2-L3 and L3-L4. No significant residual foraminal or canal stenosis at fused levels. These results will be called to the ordering clinician or representative by the Radiologist Assistant, and communication documented in the PACS or zVision Dashboard. Electronically Signed: By: Davina Poke D.O. On: 04/16/2019 12:28   CT ABDOMEN PELVIS W CONTRAST  Result Date: 04/15/2019 CLINICAL DATA:  Shortness of breath.  Nausea and vomiting. EXAM: CT ANGIOGRAPHY CHEST CT ABDOMEN AND PELVIS WITH CONTRAST TECHNIQUE: Multidetector CT imaging of the chest was performed using the standard protocol during bolus administration of intravenous contrast. Multiplanar CT image  reconstructions and MIPs were obtained to evaluate the vascular anatomy. Multidetector CT imaging of the abdomen and pelvis was performed using the standard protocol during bolus administration of intravenous contrast. CONTRAST:  125m OMNIPAQUE IOHEXOL 350 MG/ML SOLN COMPARISON:  Abdominal CT scan 03/06/2019 the FINDINGS: CTA CHEST FINDINGS Cardiovascular: The heart is normal in size and stable. There is stable prominent pericardial fat and a small persistent pericardial effusion. The aorta is normal in caliber. Scattered atherosclerotic calcifications. Stable coronary artery calcifications. The pulmonary arterial tree is well opacified. No filling defects to suggest pulmonary embolism. Mediastinum/Nodes: Stable scattered mediastinal and hilar lymph nodes and scattered calcified nodes. The esophagus is grossly normal. Lungs/Pleura: Mosaic pattern of ground-glass attenuation in the lungs diffusely likely due to small airways disease such as asthma, constrictive, acute or respiratory bronchiolitis, hypersensitivity pneumonitis or cryptogenic organizing pneumonia. No focal infiltrates or worrisome pulmonary lesions. There are patchy areas of subsegmental atelectasis and a small right pleural effusion. Musculoskeletal: No breast masses, supraclavicular or axillary adenopathy. The bony thorax is intact. Vertebral augmentation changes are noted at T11. Review of the MIP images confirms the above findings. CT ABDOMEN and PELVIS FINDINGS Hepatobiliary: No focal hepatic lesions are identified. There is mild intra and extrahepatic biliary dilatation likely due to prior cholecystectomy. The portal and hepatic veins are patent. Pancreas: No mass, inflammation or ductal dilatation. Stable moderate atrophy. Spleen: Normal size.  Small calcified granulomas. Adrenals/Urinary Tract: The adrenal glands and kidneys are unremarkable. The bladder appears normal. Stomach/Bowel: The stomach, duodenum, small bowel and colon are grossly  normal. No acute inflammatory changes, mass lesions or obstructive findings. The terminal ileum and appendix are normal. Descending colon and sigmoid colon diverticulosis but no findings for acute diverticulitis. Vascular/Lymphatic: Advanced atherosclerotic calcifications involving the aorta and iliac arteries. No aneurysm or dissection. The branch vessels are patent. The major venous structures are patent. No mesenteric or retroperitoneal mass or adenopathy. Small scattered lymph nodes are stable. Reproductive: Surgically absent. Other: No pelvic mass or free pelvic fluid collections. No inguinal mass or adenopathy. Musculoskeletal: A stable appearing right hip prosthesis. Advanced degenerative changes involving the left hip. Postoperative changes involving the spine with marked lytic appearance of L2, L3 and L4. T12 also appears somewhat expanded and lytic. There is also a aggressive lytic lesion involving the left ischium and marked periostitis. Findings suspicious for osseous metastatic disease. Review of the MIP images confirms the above findings. IMPRESSION: 1. No CT findings for pulmonary embolism. 2. No aortic aneurysm or dissection. 3. Persistent small pericardial effusion and prominent pericardial fat.  4. Mosaic pattern of ground-glass attenuation throughout the lungs most likely small airways disease. Please see above discussion. 5. Small right pleural effusion and overlying atelectasis. 6. No acute abdominal/pelvic findings, mass lesions or adenopathy. 7. Prior small nondisplaced fracture involving the left ischium now appears to be an aggressive lytic process with marked periostitis. There is also marked lucency of the L2, L3 and L4 vertebral bodies and possibly T12. Could not exclude metastatic disease. Electronically Signed   By: Marijo Sanes M.D.   On: 04/15/2019 13:50   DG Chest Port 1 View  Result Date: 04/15/2019 CLINICAL DATA:  Altered mental status. EXAM: PORTABLE CHEST 1 VIEW COMPARISON:   March 29, 2019 FINDINGS: Cardiomediastinal silhouette is enlarged. Mediastinal contours appear intact. There is no evidence of pleural effusion or pneumothorax. Bilateral lower lobe atelectasis versus mild peribronchial airspace consolidation. Osseous structures are without acute abnormality. Soft tissues are grossly normal. IMPRESSION: 1. Bilateral lower lobe atelectasis versus mild peribronchial airspace consolidation. 2. Enlarged cardiac silhouette. Electronically Signed   By: Fidela Salisbury M.D.   On: 04/15/2019 11:40   US Abdomen Limited RUQ  Result Date: 04/15/2019 CLINICAL DATA:  Elevated liver function tests. EXAM: ULTRASOUND ABDOMEN LIMITED RIGHT UPPER QUADRANT COMPARISON:  CT of the abdomen April 15, 2019 FINDINGS: Gallbladder: Post cholecystectomy.  No focal fluid collection. Common bile duct: Diameter: 6.6 mm Liver: No focal lesion identified. Within normal limits in parenchymal echogenicity. Portal vein is patent on color Doppler imaging with normal direction of blood flow towards the liver. Other: None. IMPRESSION: Normal right upper quadrant ultrasound post cholecystectomy. Electronically Signed   By: Fidela Salisbury M.D.   On: 04/15/2019 16:00        Scheduled Meds: . vitamin C  1,000 mg Oral Daily  . cholecalciferol  1,000 Units Oral Daily  . escitalopram  10 mg Oral Daily  . heparin  5,000 Units Subcutaneous Q8H  . hydrocortisone sod succinate (SOLU-CORTEF) inj  50 mg Intravenous Q6H  . metoprolol tartrate  5 mg Intravenous Q6H  . pantoprazole  40 mg Oral Daily  . rosuvastatin  10 mg Oral QHS  . vitamin E  400 Units Oral Daily   Continuous Infusions: . azithromycin    . ceFEPime (MAXIPIME) IV 2 g (04/17/19 0102)     LOS: 2 days    Time spent: 35 minutes.     Elmarie Shiley, MD [facility]   If 7PM-7AM, please contact night-coverage [email or web address] Password [facility] 04/17/2019, 9:49 AM

## 2019-04-17 NOTE — Progress Notes (Signed)
Patient ID: Betty Jackson, female   DOB: March 12, 1935, 85 y.o.   MRN: [id number] Deavian Sawada is well-known to my service.  I have reviewed the imaging studies from this admission thus far.  Awaiting results of pelvic MRI.  I stop by the room and spoke with her daughter.  The patient was downstairs having an MRI of the pelvis.  I noted that there is evidence of cord compression at the level of T10-11 and T11-12.  Depending on what the MRI findings of the pelvis are will determine whether any specific treatment is needed for her ischial lesion.  The MRI of the thoracic spine suggest cord compression and whether this is amenable to surgical intervention needs to be carefully discussed as she is severely osteoporotic.  I will evaluate her and discuss those findings when she is available tomorrow.

## 2019-04-17 NOTE — Progress Notes (Signed)
OT Cancellation Note  Patient Details Name: Betty Jackson MRN: [id number] DOB: 1934-04-03   Cancelled Treatment:    Reason Eval/Treat Not Completed: Patient at procedure or test/ unavailable. Per chart pt has arrived for MRI of pelvis. Will re-attempt eval at later time.  [facility]  OTR/L Acute [employer] Pager [phone] Office [phone]     04/17/2019, 3:16 PM

## 2019-04-17 NOTE — Evaluation (Signed)
Clinical/Bedside Swallow Evaluation Patient Details  Name: MIKAYA GILTNER MRN: [id number] Date of Birth: 08-25-1934  Today's Date: 04/17/2019 Time: SLP Start Time (ACUTE ONLY): 83 SLP Stop Time (ACUTE ONLY): 1045 SLP Time Calculation (min) (ACUTE ONLY): 83 min  Past Medical History:  Past Medical History:  Diagnosis Date  . Abdominal pain, epigastric 06/22/2013  . Abdominal pain, other specified site 12/24/2011  . Abnormal urinalysis 05/20/2017  . Acute respiratory failure with hypoxia ([place]) 05/20/2017  . Anemia, unspecified 10/28/2012  . Anxiety state, unspecified 10/28/2012  . Bruises easily    d/t being on prednisone and plavix  . CAD (coronary artery disease)    a. Stent RCA in [facility];  b. Cath approx 2009 - nonobs per pt report.  . Cataract    immature on the left eye  . Chronic insomnia 02/07/2013  . Chronic lower back pain    scoliosis  . CKD (chronic kidney disease) stage 3, GFR 30-59 ml/min   . Complication of anesthesia    pt has a very high tolerance to meds  . Coronary disease 05/20/2017  . CVA (cerebral infarction) 10/29/2012  . DDD (degenerative disc disease)   . Depression   . Diverticulitis    hx of  . Diverticulosis   . Elevated transaminase level 02/07/2013  . Enteritis   . GERD (gastroesophageal reflux disease) 09/01/2010  . Giant cell arteritis ([place])   . Hemorrhoids   . Herniated nucleus pulposus, L5-S1, right 11/04/2015  . History of scabies   . HTN (hypertension) 05/20/2017  . Hyperlipidemia    takes Lipitor daily  . Hypertension    takes Amlodipine,Losartan,Metoprolol,and HCTZ daily  . Incontinence of urine   . Influenza A 05/20/2017  . Insomnia    takes Restoril nightly  . Lumbar stenosis 04/25/2013  . Major depressive disorder, recurrent episode, moderate ([place]) 07/16/2013  . Migraines    "back in my 20's; none since" (10/28/2012)  . Osteoporosis   . PAF (paroxysmal atrial fibrillation) ([place]) 2011   a. lone epidode in 2011 according to notes.  .  Rheumatoid arthritis ([place])   . Scoliosis   . Sepsis ([place]) 05/20/2017  . Sinus bradycardia    a. on chronic bb  . Temporal arteritis ([place]) 2011   a. followed @ [facility]; potential flareup 10/28/2012/notes 10/28/2012  . Vocal cord dysfunction    "they don't operate properly" (10/28/2012)   Past Surgical History:  Past Surgical History:  Procedure Laterality Date  . ABDOMINAL HYSTERECTOMY  ~ 1984   vaginally  . BACK SURGERY  7-50yrs ago   X Stop  . BLADDER SUSPENSION  2001  . BREAST BIOPSY Right   . CATARACT EXTRACTION W/ INTRAOCULAR LENS IMPLANT Right ~ 08/2012  . CHEST TUBE INSERTION Left 03/08/2019   Procedure: Chest Tube Insertion;  Surgeon: Ivin Poot, MD;  Location: [place];  Service: Thoracic;  Laterality: Left;  . COLONOSCOPY  01/26/2012   Procedure: COLONOSCOPY;  Surgeon: Ladene Artist, MD,FACG;  Location: [facility] ENDOSCOPY;  Service: Endoscopy;  Laterality: N/A;  note the EGD is possible  . CORONARY ANGIOPLASTY WITH STENT PLACEMENT  2006   X 1 stent  . EPIDURAL BLOCK INJECTION    . ESOPHAGOGASTRODUODENOSCOPY  01/26/2012   Procedure: ESOPHAGOGASTRODUODENOSCOPY (EGD);  Surgeon: Ladene Artist, MD,FACG;  Location: [facility] ENDOSCOPY;  Service: Endoscopy;  Laterality: N/A;  . HEMIARTHROPLASTY HIP Right 2012  . LAPAROSCOPIC CHOLECYSTECTOMY  2001  . LUMBAR FUSION  03/2013  . LUMBAR LAMINECTOMY/DECOMPRESSION MICRODISCECTOMY Right 11/04/2015  Procedure: Right Lumbar Five-Sacral One Microdiskectomy;  Surgeon: Kristeen Miss, MD;  Location: [place] NEURO ORS;  Service: Neurosurgery;  Laterality: Right;  Right L5-S1 Microdiskectomy  . moles removed that required stiches     one on leg and one on face  . SUBXYPHOID PERICARDIAL WINDOW N/A 03/08/2019   Procedure: SUBXYPHOID PERICARDIAL WINDOW;  Surgeon: Ivin Poot, MD;  Location: [place];  Service: Thoracic;  Laterality: N/A;  . TEE WITHOUT CARDIOVERSION N/A 03/08/2019   Procedure: TRANSESOPHAGEAL ECHOCARDIOGRAM (TEE);  Surgeon: Prescott Gum, Collier Salina, MD;   Location: [place];  Service: Thoracic;  Laterality: N/A;  . TEMPORAL ARTERY BIOPSY / LIGATION Bilateral 2011  . TONSILLECTOMY AND ADENOIDECTOMY     at age 11  . TUBAL LIGATION  ~ 1982  . X-STOP IMPLANTATION  ~ 2010   "lower back" (10/28/2012)   HPI:  85 year old with past medical history significant for CKD stage III, CAD, stroke, temporal arteritis, RA on chronic steroids, status post L2-3, L3-L4 posterior fusion by Dr. Ellene Route in 2015 and L5-S1 microdiscectomy by Dr. Ellene Route  in 2017.  patient daughter noted that patient has been limping around, patient complaining of worsening back pain.  Patient was found to be lethargic and confused by daughter the morning of admission.  Patient has been vomited in the bed. Evaluation in the ED CT abdomen and pelvis revealed a prior  small nondisplaced fracture involving the left ischium now appears to be an aggressive lytic process with marked periostitis.  There is also marked lucency of L2, L3 and L4 vertebral bodies and possible T12.    Assessment / Plan / Recommendation Clinical Impression  Swallow function appeared intact. Pt followed by ST on recent admissions for dysphagia monitoring: hx of GERD, dysphonia at baseline. 3 oz water challenge unremarkable. No overt s/sx of aspiration with any POs. Pt denies current dysphagia symptoms. Continue regular thin liquid diet with standard aspiration precautions.   SLP Visit Diagnosis: Dysphagia, unspecified (R13.10)    Aspiration Risk  Mild aspiration risk    Diet Recommendation   Regular, thin liquids   Medication Administration: Whole meds with liquid    Other  Recommendations Oral Care Recommendations: Oral care BID   Follow up Recommendations None      Frequency and Duration   NA         Prognosis   Good     Swallow Study   General Date of Onset: 04/15/19 HPI: 85 year old with past medical history significant for CKD stage III, CAD, stroke, temporal arteritis, RA on chronic steroids, status  post L2-3, L3-L4 posterior fusion by Dr. Ellene Route in 2015 and L5-S1 microdiscectomy by Dr. Ellene Route  in 2017.  patient daughter noted that patient has been limping around, patient complaining of worsening back pain.  Patient was found to be lethargic and confused by daughter the morning of admission.  Patient has been vomited in the bed. Evaluation in the ED CT abdomen and pelvis revealed a prior  small nondisplaced fracture involving the left ischium now appears to be an aggressive lytic process with marked periostitis.  There is also marked lucency of L2, L3 and L4 vertebral bodies and possible T12.  Type of Study: Bedside Swallow Evaluation Previous Swallow Assessment: CSE 03/12/19 Diet Prior to this Study: Regular;Thin liquids Temperature Spikes Noted: No Respiratory Status: Room air History of Recent Intubation: No Behavior/Cognition: Cooperative;Pleasant mood;Alert Oral Cavity Assessment: Within Functional Limits Oral Care Completed by SLP: No Oral Cavity - Dentition: Adequate natural dentition Vision: Functional for self-feeding  Self-Feeding Abilities: Able to feed self Patient Positioning: Upright in bed Baseline Vocal Quality: Hoarse(hx of dysphonia at baseline) Volitional Swallow: Able to elicit    Oral/Motor/Sensory Function     Ice Chips Ice chips: Not tested   Thin Liquid Thin Liquid: Within functional limits Presentation: Cup;Straw    Nectar Thick Nectar Thick Liquid: Not tested   Honey Thick Honey Thick Liquid: Not tested   Puree Puree: Within functional limits   Solid     Solid: Within functional limits      Zaivion Kundrat E Kasra Melvin MA, CCC-SLP  Acute Rehabilitation Services  04/17/2019,11:02 AM

## 2019-04-18 DIAGNOSIS — I248 Other forms of acute ischemic heart disease: Secondary | ICD-10-CM

## 2019-04-18 DIAGNOSIS — I480 Paroxysmal atrial fibrillation: Secondary | ICD-10-CM

## 2019-04-18 LAB — BRAIN NATRIURETIC PEPTIDE: B Natriuretic Peptide: 930.1 pg/mL — ABNORMAL HIGH (ref 0.0–100.0)

## 2019-04-18 LAB — CBC
HCT: 27.4 % — ABNORMAL LOW (ref 36.0–46.0)
Hemoglobin: 8.6 g/dL — ABNORMAL LOW (ref 12.0–15.0)
MCH: 28.9 pg (ref 26.0–34.0)
MCHC: 31.4 g/dL (ref 30.0–36.0)
MCV: 91.9 fL (ref 80.0–100.0)
Platelets: 161 10*3/uL (ref 150–400)
RBC: 2.98 MIL/uL — ABNORMAL LOW (ref 3.87–5.11)
RDW: 15.1 % (ref 11.5–15.5)
WBC: 12.8 10*3/uL — ABNORMAL HIGH (ref 4.0–10.5)
nRBC: 0 % (ref 0.0–0.2)

## 2019-04-18 LAB — BASIC METABOLIC PANEL
Anion gap: 11 (ref 5–15)
BUN: 28 mg/dL — ABNORMAL HIGH (ref 8–23)
CO2: 21 mmol/L — ABNORMAL LOW (ref 22–32)
Calcium: 8.6 mg/dL — ABNORMAL LOW (ref 8.9–10.3)
Chloride: 108 mmol/L (ref 98–111)
Creatinine, Ser: 1.07 mg/dL — ABNORMAL HIGH (ref 0.44–1.00)
GFR calc Af Amer: 56 mL/min — ABNORMAL LOW (ref >60)
GFR calc non Af Amer: 48 mL/min — ABNORMAL LOW (ref >60)
Glucose, Bld: 135 mg/dL — ABNORMAL HIGH (ref 70–99)
Potassium: 4.3 mmol/L (ref 3.5–5.1)
Sodium: 140 mmol/L (ref 135–145)

## 2019-04-18 LAB — MAGNESIUM: Magnesium: 2.3 mg/dL (ref 1.7–2.4)

## 2019-04-18 MED ORDER — PREDNISONE 20 MG PO TABS
30.0000 mg | ORAL_TABLET | Freq: Every day | ORAL | Status: DC
  Administered 2019-04-18 – 2019-04-21 (×4): 30 mg via ORAL
  Filled 2019-04-18 (×4): qty 1

## 2019-04-18 MED ORDER — ALBUTEROL SULFATE (2.5 MG/3ML) 0.083% IN NEBU
2.5000 mg | INHALATION_SOLUTION | Freq: Four times a day (QID) | RESPIRATORY_TRACT | Status: DC
  Administered 2019-04-18: 2.5 mg via RESPIRATORY_TRACT
  Filled 2019-04-18: qty 3

## 2019-04-18 MED ORDER — HEPARIN (PORCINE) 25000 UT/250ML-% IV SOLN
950.0000 [IU]/h | INTRAVENOUS | Status: DC
  Administered 2019-04-18: 21:00:00 850 [IU]/h via INTRAVENOUS
  Administered 2019-04-20: 950 [IU]/h via INTRAVENOUS
  Filled 2019-04-18 (×2): qty 250

## 2019-04-18 MED ORDER — ALBUTEROL SULFATE (2.5 MG/3ML) 0.083% IN NEBU
2.5000 mg | INHALATION_SOLUTION | Freq: Two times a day (BID) | RESPIRATORY_TRACT | Status: DC
  Administered 2019-04-18 – 2019-04-19 (×2): 2.5 mg via RESPIRATORY_TRACT
  Filled 2019-04-18 (×2): qty 3

## 2019-04-18 NOTE — Progress Notes (Addendum)
PROGRESS NOTE    Betty Jackson  EVO:[id number] DOB: May 27, 1934 DOA: 04/15/2019 PCP: Eulas Post, MD   Brief Narrative: 85 year old with past medical history significant for CKD stage III, CAD, stroke, temporal arteritis, RA on chronic steroids, status post L2-3, L3-L4 posterior fusion by Dr. Ellene Route in 2015 and L5-S1 microdiscectomy by Dr. Ellene Route  in 2017.  patient daughter noted that patient has been limping around, patient complaining of worsening back pain.  Patient was found to be lethargic and confused by daughter the morning of admission.  Patient has been vomited in the bed.  Evaluation in the ED CT abdomen and pelvis revealed a prior  small nondisplaced fracture involving the left ischium now appears to be an aggressive lytic process with marked periostitis.  There is also marked lucency of L2, L3 and L4 vertebral bodies and possible T12.   Patient was found to be febrile temperature 101.9, tachycardic heart rate 125, respiration rate 37, blood pressure 100/50.  Plan my elevation of troponins, CRP at 17, ESR 25, UA 0-5 white blood cell.  Right upper quadrant ultrasound post cholecystectomy.  Patient admitted with  Sepsis related to pneumonia.  Acute metabolic encephalopathy related to infection and dehydration.  Back pain related to spinal stenosis, patient has some nerve compression.  Evaluated by Dr. Vertell Limber who is recommending a steroid injection.  He will consult IR.  Plan to transition patient to heparin and stop Eliquis.  Patient had elevation of troponin and abnormal CT finding.  Patient on amiodarone.  Cardiology consulted for further evaluation.  Cardiology agree with stopping amiodarone.  Assessment & Plan:   Active Problems:   Sepsis ([place])   Estimated body mass index is 26.19 kg/m as calculated from the following:   Height as of this encounter: 5' 4.5" (1.638 m).   Weight as of this encounter: 70.3 kg.  1-Sepsis; Possible related to  PNA UA with 0-5 white blood  cell.  Follow urine culture Ultrasound negative. COVID-19 negative. Follow blood cultures. No growth to date.  MRI to rule out infectious process of the spine. Continue with IV antibiotics. Cefepime and Azithromycin.  CT angio Lungs; Mosaic pattern of ground-glass attenuation in the lungs diffusely likely due to small airways disease such as asthma, constrictive, acute or respiratory bronchiolitis, hypersensitivity pneumonitis or cryptogenic organizing pneumonia. WBC decreased to 12.   2- Abnormal Left Ischium Lytic lesions;  -Prior small nondisplaced fracture involving the left ischium now appears to be an aggressive lytic process with marked periostitis. There is also marked lucency of the L2, L3 and L4 vertebral bodies and possibly T12. Could not exclude metastatic disease. -Neurosurgery consulted. Per Dr Arnoldo Morale no indication for urgent spinal Surgery. Dr. Ellene Route to follow up with patient.  -MRI thoracic and lumbar spine negative for infection or bone lytic lesions. She has multi level spinal stenosis and cord compression T 11-12. Left Message to Dr Ellene Route to see patient or contact Daughter.  -Orthopedic consulted, recommend MRI pelvis with and without contrast to further evaluate.  -EXH:BZJIRCVELFY avulsion type fracture at the left ischial tuberosity with more likely an aggressive pattern of callus formation than an actual bone lesion causing the periostitis. No definite soft tissue mass is identified and no other bone lesions are identified. -Needs CT in 4 to 6 weeks.   3-Anemia: Prior hemoglobin per records around 9.7. Hb on admission  at 12, suspect hemoconcentration. Started ferrous sulfate.  Continue to monitor Hb.   4-Acute metabolic encephalopathy: Related to acute infectious process. Ammonia  level normal. Improved.   5-Elevation of troponin: Patient denies chest pain. Troponins trending down.  2D ECHO normal EF, No wall motion abnormalities. Mild to moderate tricuspid  regurgitation. Cardiology consulted.   6-Hypomagnesemia: Replaced. Mg at 2.3  7-Chronic A fib;  Resume Cardizem and oral metoprolol, amiodarone.  Hold Eliquis for epidural steroid injection. Discussed with cardiology, will do heparin gtt for bridge.  Addendum; Patient report now that she has not been taking Amiodarone, Dr Prescott Gum advised her to stop take it.   CKD stage III; stable, cr. Cr up to 1. 07  RA; change hydrocortisone to prednisone.   DVT prophylaxis: Eliquis.  Code Status: DNR Family Communication:  Daughter updated at bedside Disposition Plan: needs epidural steroid injection, continue treatment for PNA, monitor renal function. Bridge with heparin.  Consultants:  Neurosurgery  Procedures:  [place]; Normal right upper quadrant ultrasound post cholecystectomy.   Antimicrobials:  Vancomycin 1-16 Cefepime 1-16  Subjective: Alert, following command.  Breathing ok. Complaining of back pain, requiring IV pain medications. Objective: Vitals:   04/17/19 2326 04/18/19 0452 04/18/19 0700 04/18/19 0800  BP: 128/63 (!) 159/76  (!) 172/82  Pulse: 60 60 (!) 57 63  Resp: '17 14 15 20  ' Temp: 98.6 F (37 C) 97.9 F (36.6 C)  98 F (36.7 C)  TempSrc: Oral Oral  Oral  SpO2: 98% 98% 97% 97%  Weight:      Height:        Intake/Output Summary (Last 24 hours) at 04/18/2019 0847 Last data filed at 04/18/2019 0451 Gross per 24 hour  Intake 0 ml  Output 650 ml  Net -650 ml   Filed Weights   04/15/19 1048  Weight: 70.3 kg    Examination:  General exam: NAD Respiratory system: CTA Cardiovascular system: S 1, S 2 RRR Gastrointestinal system: BS present, soft, nt Central nervous system; Alert, following command Extremities: trace edema     Data Reviewed: I have personally reviewed following labs and imaging studies  CBC: Recent Labs  Lab 04/15/19 1052 04/15/19 1052 04/15/19 1148 04/16/19 0346 04/16/19 0706 04/17/19 0759 04/18/19 0241  WBC 15.9*  --   --  8.9  15.5* 15.6* 12.8*  NEUTROABS 11.9*  --   --  6.7  --   --   --   HGB 12.8   < > 13.6 7.4* 10.6* 9.3* 8.6*  HCT 41.1   < > 40.0 24.3* 33.5* 29.5* 27.4*  MCV 92.8  --   --  97.2 95.4 93.9 91.9  PLT 204  --   --  112* 146* 150 161   < > = values in this interval not displayed.   Basic Metabolic Panel: Recent Labs  Lab 04/15/19 1052 04/15/19 1052 04/15/19 1148 04/15/19 1840 04/16/19 0706 04/17/19 0759 04/18/19 0241  NA 140   < > 139 138 142 143 140  K 2.7*   < > 2.6* 3.3* 4.2 4.4 4.3  CL 96*   < > 101 103 109 109 108  CO2 29  --   --  23 21* 24 21*  GLUCOSE 136*   < > 122* 104* 125* 126* 135*  BUN 11   < > '10 11 14 19 ' 28*  CREATININE 1.01*   < > 0.80 1.03* 1.06* 0.94 1.07*  CALCIUM 8.9  --   --  7.6* 8.0* 8.8* 8.6*  MG  --   --   --   --  1.3* 2.3 2.3  PHOS  --   --   --   --  4.2  --   --    < > = values in this interval not displayed.   GFR: Estimated Creatinine Clearance: 39.5 mL/min (A) (by C-G formula based on SCr of 1.07 mg/dL (H)). Liver Function Tests: Recent Labs  Lab 04/15/19 1052 04/16/19 0706  AST 46* 29  ALT 36 26  ALKPHOS 101 79  BILITOT 2.5* 2.1*  PROT 6.3* 5.3*  ALBUMIN 3.3* 2.4*   Recent Labs  Lab 04/15/19 1052  LIPASE 28   Recent Labs  Lab 04/15/19 1134  AMMONIA 28   Coagulation Profile: Recent Labs  Lab 04/15/19 1052  INR 1.3*   Cardiac Enzymes: Recent Labs  Lab 04/15/19 1840  CKTOTAL 78   BNP (last 3 results) No results for input(s): PROBNP in the last 8760 hours. HbA1C: No results for input(s): HGBA1C in the last 72 hours. CBG: No results for input(s): GLUCAP in the last 168 hours. Lipid Profile: No results for input(s): CHOL, HDL, LDLCALC, TRIG, CHOLHDL, LDLDIRECT in the last 72 hours. Thyroid Function Tests: No results for input(s): TSH, T4TOTAL, FREET4, T3FREE, THYROIDAB in the last 72 hours. Anemia Panel: Recent Labs    04/17/19 0243  VITAMINB12 1,101*  FOLATE 13.4  FERRITIN 238  TIBC 218*  IRON 10*  RETICCTPCT  2.3   Sepsis Labs: Recent Labs  Lab 04/15/19 1052 04/15/19 1332  LATICACIDVEN 2.4* 2.6*    Recent Results (from the past 240 hour(s))  Blood Culture (routine x 2)     Status: None (Preliminary result)   Collection Time: 04/15/19 10:52 AM   Specimen: BLOOD  Result Value Ref Range Status   Specimen Description BLOOD LEFT ANTECUBITAL  Final   Special Requests   Final    BOTTLES DRAWN AEROBIC AND ANAEROBIC Blood Culture results may not be optimal due to an inadequate volume of blood received in culture bottles   Culture   Final    NO GROWTH 3 DAYS Performed at [facility], [place] [address], [place], [place] [address]    Report Status PENDING  Incomplete  Blood Culture (routine x 2)     Status: None (Preliminary result)   Collection Time: 04/15/19 10:54 AM   Specimen: BLOOD  Result Value Ref Range Status   Specimen Description BLOOD RIGHT ANTECUBITAL  Final   Special Requests   Final    BOTTLES DRAWN AEROBIC AND ANAEROBIC Blood Culture adequate volume   Culture   Final    NO GROWTH 3 DAYS Performed at [facility], [place] [address] [place], [place] [address]    Report Status PENDING  Incomplete  Urine culture     Status: None   Collection Time: 04/15/19  4:49 PM   Specimen: In/Out Cath Urine  Result Value Ref Range Status   Specimen Description IN/OUT CATH URINE  Final   Special Requests NONE  Final   Culture   Final    NO GROWTH Performed at [facility], [place] [address], [place],  [address]    Report Status 04/16/2019 FINAL  Final  MRSA PCR Screening     Status: None   Collection Time: 04/16/19 12:33 PM   Specimen: Nasal Mucosa; Nasopharyngeal  Result Value Ref Range Status   MRSA by PCR NEGATIVE NEGATIVE Final    Comment:        The GeneXpert MRSA Assay (FDA approved for NASAL specimens only), is one component of a comprehensive MRSA colonization surveillance program. It is not intended to diagnose MRSA infection  nor to guide or monitor  treatment for MRSA infections. Performed at [facility], [place] [address] [place], [place] [address]          Radiology Studies: MR THORACIC SPINE W WO CONTRAST  Result Date: 04/16/2019 CLINICAL DATA:  Back pain. Abnormal CT EXAM: MRI THORACIC WITHOUT AND WITH CONTRAST TECHNIQUE: Multiplanar and multiecho pulse sequences of the thoracic spine were obtained without and with intravenous contrast. CONTRAST:  60m GADAVIST GADOBUTROL 1 MMOL/ML IV SOLN COMPARISON:  CT 04/15/2019 FINDINGS: MRI THORACIC SPINE FINDINGS Alignment: Thoracic vertebral alignment is maintained without significant listhesis. Localizer sequences which include the cervical spine demonstrate grade 1 anterolisthesis C3 on C4, C4 on C5, and C5 on C6. Vertebrae: Low T1-T2 signal changes within the T11 and L1 vertebral segments compatible with prior cement augmentation. No acute fractures. No suspicious bone lesion. Scattered intraosseous hemangiomas including within the T8 and T9 vertebrae. No evidence of discitis. Cord: Increased cord signal at T10-11 and T11-12 where there is high-grade canal stenosis and cord compression (series 36, image 29; series 35, image 9). The remaining thoracic cord is otherwise normal in signal and caliber. Paraspinal and other soft tissues: Small right pleural effusion. Disc levels: T1-T2: Mild left foraminal narrowing secondary to facet hypertrophy. No canal stenosis. T2-T3: Mild diffuse disc bulge without foraminal or canal stenosis. T3-T4: Mild diffuse disc bulge without foraminal or canal stenosis. T4-T5: No significant canal or neural foraminal narrowing T5-T6: No significant canal or neural foraminal narrowing T6-T7: No significant canal or neural foraminal narrowing T7-T8: No significant canal or neural foraminal narrowing T8-T9: No significant canal or neural foraminal narrowing T9-T10: No significant canal or neural foraminal narrowing T10-T11: Left paracentral disc bulge and posterior element  hypertrophy result in moderate to severe canal stenosis with compression of the cord and increased T2 cord signal. There is moderate to severe left and mild right foraminal stenosis. T11-T12: Diffuse disc bulge, eccentric to the left, posterior element hypertrophy, and prominent posterior epidural fat contribute to moderate to severe canal stenosis with compression of the cord and increased T2 cord signal. Severe left foraminal stenosis. IMPRESSION: 1. 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 2. Severe left foraminal stenosis at T11-T12 and severe left foraminal stenosis at T11-T12. 3. No acute fracture or suspicious marrow replacing lesion. 4. Small right pleural effusion. Electronically Signed   By: NDavina PokeD.O.   On: 04/16/2019 12:47   MR Lumbar Spine W Wo Contrast  Addendum Date: 04/16/2019   ADDENDUM REPORT: 04/16/2019 13:12 ADDENDUM: Findings were discussed via telephone with Dr. RTyrell Antonioat 1:10 p.m. on 04/16/2019 by Dr. NDavina Poke Electronically Signed   By: NDavina PokeD.O.   On: 04/16/2019 13:12   Result Date: 04/16/2019 CLINICAL DATA:  Abnormal appearance of the lumbar spine on recent CT. Concern for osseous metastatic disease. History of lumbar fusion EXAM: MRI LUMBAR SPINE WITHOUT AND WITH CONTRAST TECHNIQUE: Multiplanar and multiecho pulse sequences of the lumbar spine were obtained without and with intravenous contrast. CONTRAST:  739mGADAVIST GADOBUTROL 1 MMOL/ML IV SOLN COMPARISON:  CT 04/15/2019. MRI 08/13/2016 FINDINGS: Segmentation:  Standard. Alignment:  Lumbar levocurvature. Trace retrolisthesis L1 on L2. Vertebrae: Prior posterior instrumented fusion and decompression at L2-L4. Susceptibility artifact related to hardware slightly degrades evaluation of the adjacent structures. Low T1/T2 signal related to prior cement augmentation  within the L1 and T11 vertebral bodies. Bone marrow  signal is mildly heterogeneous and largely fatty replaced. No evidence of fracture, marrow replacing lesion, or discitis. Conus medullaris and cauda equina: Conus extends to the L1-L2 level. Cauda equina appear normal. The lower cord is mildly increased in signal the T11-12 through T12-L1 levels. Paraspinal and other soft tissues: Negative. Disc levels: T10-T11: Sagittal sequences only. Diffuse disc bulge and posterior element hypertrophy results in moderate to severe canal stenosis with evidence the cord compression (series 9, image 8). T11-T12: Sagittal sequences only. Diffuse disc bulge and posterior element hypertrophy result in moderate to severe canal stenosis with evidence of cord compression (series 9, image 8). T12-L1: Diffuse disc bulge and posterior element hypertrophy result in moderate canal stenosis and mild-to-moderate bilateral foraminal stenosis, left worse than right. L1-L2: Slight retropulsion and endplate ridging with mild posterior element hypertrophy resulting in mild-to-moderate right foraminal stenosis and mild canal stenosis. L2-L3: Prior fusion and decompression without evidence of significant residual foraminal or canal stenosis. L3-L4: Prior fusion and decompression without evidence of significant residual foraminal or canal stenosis. L4-L5: Diffuse disc bulge with right paracentral extrusion with 16 mm caudal extension of disc material (series 13, image 8; series 11, image 30). Moderate to severe canal stenosis at this level. Moderate left foraminal stenosis with prominent left-sided facet arthropathy contributing. L5-S1: Mild diffuse disc bulge with endplate ridging and bilateral facet arthropathy. Severe right and mild left foraminal stenosis and mild-to-moderate canal stenosis. IMPRESSION: 1. No acute fracture, marrow replacing lesion, or evidence of discitis. 2. Moderate to severe multifactorial canal stenosis at T10-T11 and T11-12  with evidence of cord compression. Subtly increased STIR signal within the cord at these levels suggestive of compressive myelopathy. 3. Moderate to severe canal stenosis at T12-L1 and L4-5. 4. Multilevel bilateral foraminal stenosis most severe on the right at L5-S1. 5. Prior posterior instrumented fusion and decompression at L2-L3 and L3-L4. No significant residual foraminal or canal stenosis at fused levels. These results will be called to the ordering clinician or representative by the Radiologist Assistant, and communication documented in the PACS or zVision Dashboard. Electronically Signed: By: Davina Poke D.O. On: 04/16/2019 12:28   MR PELVIS W WO CONTRAST  Result Date: 04/17/2019 CLINICAL DATA:  Evaluate left ischial lesion seen on recent CT scan. EXAM: MRI PELVIS WITHOUT AND WITH CONTRAST TECHNIQUE: Multiplanar multisequence MR imaging of the pelvis was performed both before and after administration of intravenous contrast. CONTRAST:  15m GADAVIST GADOBUTROL 1 MMOL/ML IV SOLN COMPARISON:  CT scan 04/15/2019 FINDINGS: Diffuse low T1 and high T2 signal intensity in the ischial tuberosity without definite soft tissue component. On the coronal images this appears to be fairly well circumscribed and has the appearance of a progressive avulsion fracture at the ischial tuberosity with probable aggressive callus formation due to motion by the pull of the hamstring tendons. I do not see a definite neoplastic process. No other pelvic lesions are identified. Moderate edema like signal changes in the left hip musculature, most notably the gluteus minimus and medius muscles and the abductor muscles likely muscle tear/muscle strains. Similar findings involving the right abductor muscles. No significant intrapelvic abnormalities. Moderate artifact from the right hip prosthesis. IMPRESSION: 1. Progressive avulsion type fracture at the left ischial tuberosity with more likely an aggressive pattern of callus  formation than an actual bone lesion causing the periostitis. No definite soft tissue mass is identified and no other bone lesions are identified. 2. Recommend follow-up CT scan in 4-6 weeks to reassess healing changes. Electronically Signed   By:  Marijo Sanes M.D.   On: 04/17/2019 16:27   ECHOCARDIOGRAM COMPLETE  Result Date: 04/17/2019   ECHOCARDIOGRAM REPORT   Patient Name:   CLEMMA JOHNSEN Date of Exam: 04/17/2019 Medical Rec #:  [id number]    Height:       64.5 in Accession #:    [id number]   Weight:       155.0 lb Date of Birth:  [birth year]    BSA:          1.76 m Patient Age:    79 years     BP:           148/67 mmHg Patient Gender: F            HR:           63 bpm. Exam Location:  Inpatient Procedure: 2D Echo Indications:    Elevated troponin  History:        Patient has prior history of Echocardiogram examinations, most                 recent 03/09/2019. Arrythmias:Atrial Fibrillation. CKD. CAD. H/o                 stroke.  Sonographer:    Clayton Lefort RDCS (AE) Referring Phys: [id number] BELKYS A REGALADO IMPRESSIONS  1. Left ventricular ejection fraction, by visual estimation, is 60 to 65%. The left ventricle has normal function. There is mildly increased left ventricular hypertrophy.  2. Left ventricular diastolic parameters are consistent with Grade II diastolic dysfunction (pseudonormalization). The mean left atrial pressure is high.  3. The left ventricle has no regional wall motion abnormalities.  4. Global right ventricle has normal systolic function.The right ventricular size is normal. No increase in right ventricular wall thickness.  5. Left atrial size was mildly dilated.  6. Right atrial size was mildly dilated.  7. Mild mitral annular calcification.  8. The mitral valve is normal in structure. Trivial mitral valve regurgitation.  9. The tricuspid valve is normal in structure. 10. The tricuspid valve is normal in structure. Tricuspid valve regurgitation is mild-moderate. 11. The aortic valve is  normal in structure. Aortic valve regurgitation is trivial. 12. The pulmonic valve was not well visualized. Pulmonic valve regurgitation is not visualized. 13. Mildly elevated pulmonary artery systolic pressure. 14. The tricuspid regurgitant velocity is 2.18 m/s, and with an assumed right atrial pressure of 15 mmHg, the estimated right ventricular systolic pressure is mildly elevated at 34.0 mmHg. 15. The inferior vena cava is dilated in size with <50% respiratory variability, suggesting right atrial pressure of 15 mmHg. 16. There is exaggerated respiratory displacement of the interventricular septum. This could represent contrictive physiology , but can also be seen with increased work of breathing (e.g. COPD or asthma exacerbation). 66. Compared to 03/09/2019, left ventricular wall motion and overall systolic function have improved. FINDINGS  Left Ventricle: Left ventricular ejection fraction, by visual estimation, is 60 to 65%. The left ventricle has normal function. The left ventricle has no regional wall motion abnormalities. The left ventricular internal cavity size was the left ventricle is normal in size. There is mildly increased left ventricular hypertrophy. Concentric left ventricular hypertrophy. Left ventricular diastolic parameters are consistent with Grade II diastolic dysfunction (pseudonormalization). Right Ventricle: The right ventricular size is normal. No increase in right ventricular wall thickness. Global RV systolic function is has normal systolic function. The tricuspid regurgitant velocity is 2.18 m/s, and with an assumed right atrial pressure  of 15 mmHg,  the estimated right ventricular systolic pressure is mildly elevated at 34.0 mmHg. Left Atrium: Left atrial size was mildly dilated. Right Atrium: Right atrial size was mildly dilated Pericardium: There is no evidence of pericardial effusion. Mitral Valve: The mitral valve is normal in structure. Mild mitral annular calcification. Trivial  mitral valve regurgitation. MV peak gradient, 4.4 mmHg. Tricuspid Valve: The tricuspid valve is normal in structure. Tricuspid valve regurgitation is mild-moderate. Aortic Valve: The aortic valve is normal in structure. Aortic valve regurgitation is trivial. Aortic regurgitation PHT measures 506 msec. Aortic valve mean gradient measures 3.0 mmHg. Aortic valve peak gradient measures 4.0 mmHg. Aortic valve area, by VTI measures 2.14 cm. Pulmonic Valve: The pulmonic valve was not well visualized. Pulmonic valve regurgitation is not visualized. Pulmonic regurgitation is not visualized. Aorta: The aortic root and ascending aorta are structurally normal, with no evidence of dilitation. Venous: The inferior vena cava is dilated in size with less than 50% respiratory variability, suggesting right atrial pressure of 15 mmHg. IAS/Shunts: No atrial level shunt detected by color flow Doppler.  LEFT VENTRICLE PLAX 2D LVIDd:         4.40 cm LVIDs:         3.40 cm LV PW:         1.30 cm LV IVS:        1.30 cm LVOT diam:     2.00 cm LV SV:         40 ml LV SV Index:   22.32 LVOT Area:     3.14 cm  RIGHT VENTRICLE            IVC RV Basal diam:  3.00 cm    IVC diam: 2.70 cm RV S prime:     9.46 cm/s TAPSE (M-mode): 2.4 cm LEFT ATRIUM             Index       RIGHT ATRIUM           Index LA diam:        4.50 cm 2.55 cm/m  RA Area:     19.80 cm LA Vol (A2C):   75.8 ml 42.95 ml/m RA Volume:   50.70 ml  28.73 ml/m LA Vol (A4C):   56.3 ml 31.90 ml/m LA Biplane Vol: 65.9 ml 37.34 ml/m  AORTIC VALVE AV Area (Vmax):    2.30 cm AV Area (Vmean):   2.20 cm AV Area (VTI):     2.14 cm AV Vmax:           99.70 cm/s AV Vmean:          78.600 cm/s AV VTI:            0.267 m AV Peak Grad:      4.0 mmHg AV Mean Grad:      3.0 mmHg LVOT Vmax:         73.10 cm/s LVOT Vmean:        55.100 cm/s LVOT VTI:          0.182 m LVOT/AV VTI ratio: 0.68 AI PHT:            506 msec  AORTA Ao Root diam: 3.00 cm Ao Asc diam:  3.30 cm MITRAL VALVE               TRICUSPID VALVE MV Area (PHT): 2.44 cm   TR Peak grad:   19.0 mmHg MV Peak grad:  4.4 mmHg   TR Vmax:  239.00 cm/s MV Mean grad:  1.0 mmHg MV Vmax:       1.05 m/s   SHUNTS MV Vmean:      50.4 cm/s  Systemic VTI:  0.18 m MV VTI:        0.31 m     Systemic Diam: 2.00 cm MV PHT:        90.00 msec  Mihai Croitoru MD Electronically signed by Sanda Klein MD Signature Date/Time: 04/17/2019/10:09:57 PM    Final         Scheduled Meds: . albuterol  2.5 mg Nebulization QID  . amiodarone  200 mg Oral Daily  . apixaban  5 mg Oral BID  . vitamin C  1,000 mg Oral Daily  . cholecalciferol  1,000 Units Oral Daily  . diltiazem  60 mg Oral TID  . escitalopram  10 mg Oral Daily  . ferrous sulfate  325 mg Oral BID WC  . metoprolol tartrate  100 mg Oral BID  . pantoprazole  40 mg Oral Daily  . predniSONE  30 mg Oral Q breakfast  . rosuvastatin  10 mg Oral QHS  . vitamin E  400 Units Oral Daily   Continuous Infusions: . azithromycin 500 mg (04/17/19 1131)  . ceFEPime (MAXIPIME) IV 2 g (04/18/19 0409)     LOS: 3 days    Time spent: 35 minutes.     Elmarie Shiley, MD [facility]   If 7PM-7AM, please contact night-coverage [email or web address] Password [facility] 04/18/2019, 8:47 AM

## 2019-04-18 NOTE — Progress Notes (Signed)
ANTICOAGULATION CONSULT NOTE - Initial Consult  Pharmacy Consult for heparin Indication: atrial fibrillation  Allergies  Allergen Reactions  . Dextromethorphan Rash    Patient Measurements: Height: 5' 4.5" (163.8 cm) Weight: 155 lb (70.3 kg) IBW/kg (Calculated) : 55.85 Heparin Dosing Weight: 70  Vital Signs: Temp: 97.7 F (36.5 C) (01/19 1121) Temp Source: Oral (01/19 1121) BP: 149/84 (01/19 1121) Pulse Rate: 58 (01/19 1121)  Labs: Recent Labs    04/15/19 1840 04/16/19 0346 04/16/19 0706 04/16/19 0706 04/17/19 0759 04/18/19 0241  HGB  --    < > 10.6*   < > 9.3* 8.6*  HCT  --    < > 33.5*  --  29.5* 27.4*  PLT  --    < > 146*  --  150 161  CREATININE 1.03*  --  1.06*  --  0.94 1.07*  CKTOTAL 78  --   --   --   --   --   TROPONINIHS 312*  --  168*  --   --   --    < > = values in this interval not displayed.    Estimated Creatinine Clearance: 39.5 mL/min (A) (by C-G formula based on SCr of 1.07 mg/dL (H)).   Medical History: Past Medical History:  Diagnosis Date  . Abdominal pain, epigastric 06/22/2013  . Abdominal pain, other specified site 12/24/2011  . Abnormal urinalysis 05/20/2017  . Acute respiratory failure with hypoxia ([place]) 05/20/2017  . Anemia, unspecified 10/28/2012  . Anxiety state, unspecified 10/28/2012  . Bruises easily    d/t being on prednisone and plavix  . CAD (coronary artery disease)    a. Stent RCA in [facility];  b. Cath approx 2009 - nonobs per pt report.  . Cataract    immature on the left eye  . Chronic insomnia 02/07/2013  . Chronic lower back pain    scoliosis  . CKD (chronic kidney disease) stage 3, GFR 30-59 ml/min   . Complication of anesthesia    pt has a very high tolerance to meds  . Coronary disease 05/20/2017  . CVA (cerebral infarction) 10/29/2012  . DDD (degenerative disc disease)   . Depression   . Diverticulitis    hx of  . Diverticulosis   . Elevated transaminase level 02/07/2013  . Enteritis   . GERD  (gastroesophageal reflux disease) 09/01/2010  . Giant cell arteritis ([place])   . Hemorrhoids   . Herniated nucleus pulposus, L5-S1, right 11/04/2015  . History of scabies   . HTN (hypertension) 05/20/2017  . Hyperlipidemia    takes Lipitor daily  . Hypertension    takes Amlodipine,Losartan,Metoprolol,and HCTZ daily  . Incontinence of urine   . Influenza A 05/20/2017  . Insomnia    takes Restoril nightly  . Lumbar stenosis 04/25/2013  . Major depressive disorder, recurrent episode, moderate ([place]) 07/16/2013  . Migraines    "back in my 20's; none since" (10/28/2012)  . Osteoporosis   . PAF (paroxysmal atrial fibrillation) ([place]) 2011   a. lone epidode in 2011 according to notes.  . Rheumatoid arthritis ([place])   . Scoliosis   . Sepsis ([place]) 05/20/2017  . Sinus bradycardia    a. on chronic bb  . Temporal arteritis ([place]) 2011   a. followed @ [facility]; potential flareup 10/28/2012/notes 10/28/2012  . Vocal cord dysfunction    "they don't operate properly" (10/28/2012)    Assessment: 85 year old female with history of afib on apixaban. She has received for the past 2  days, last dose this am. Patient now in need of spinal surgery and will need heparin bridge.   Apixaban given this am, will start IV heparin tonight.   Will need to follow up any stop time of heparin prior to procedure.   Goal of Therapy:  Heparin level 0.3-0.7 units/ml Aptt 66-102s Monitor platelets by anticoagulation protocol: Yes   Plan:  Start heparin tonight (20:00) at 850 units/hr Daily aptt and heparin level  Erin Hearing PharmD., BCPS Clinical Pharmacist 04/18/2019 3:37 PM

## 2019-04-18 NOTE — Consult Note (Addendum)
Cardiology Consultation:   Patient ID: JNIYA KILGO MRN: [id number]; DOB: 06/21/1934  Admit date: 04/15/2019 Date of Consult: 04/18/2019  Primary Care Provider: Eulas Post, MD Primary Cardiologist: [name], MD  Primary Electrophysiologist:  None    Patient Profile:   EUNITA KERSTIENS is a 85 y.o. female with a hx of PAF, CAD, HTN, HLD, and temporal arteritis who is being seen today for the evaluation of elevated troponin and abnormal CT at the request of Dr. Tyrell Antonio.  History of Present Illness:   Ms. Sparaco is a 85 year old female with past medical history of  PAF, CAD, HTN, HLD, and temporal arteritis on long-term steroid therapy.  She had a remote stent in the RCA in 2005.  Repeat cardiac catheterization in September 2014 showed patent RCA stent, mild disease in the left circumflex artery and LAD.  Stress Myoview in April 2016 was normal.  Patient developed atrial fibrillation in the setting of influenza A and pneumonia in February 2019.  Initially she was not discharged on anticoagulation, however her monitor demonstrated recurrent A. fib.  She was placed on Eliquis in April 2019.  Patient was last seen by Dr. Angelena Form in October 2019 at which time she was doing well.  She developed recurrent atrial fibrillation in the setting of diverticulitis in October 2020.  Metoprolol was increased to 100 mg twice daily and diltiazem to 60 mg every 8 hours.  Echocardiogram obtained during the admission showed EF 60 to 65%, otherwise no significant valve issue.  She was seen as post hospital follow-up in the A. fib clinic on 01/23/2019 at which time she was maintaining sinus rhythm.  She was admitted in December 2020 with dysphagia with nausea, vomiting, diarrhea and fever.  While in the ED, she was noted to be tachycardic and hypotensive.  CT image was positive for pericardial effusion.  Limited echocardiogram obtained on 12/7 also demonstrated moderate pericardial effusion with normal EF.   She underwent subxiphoid pericardial window by Dr. Lucianne Lei trigt on 03/08/2019.  Cardiology service was not formally consulted during the hospitalization.  During the hospitalization, she was started on amiodarone therapy to help suppress atrial fibrillation during postop period.  She was advised to follow-up with cardiology service in 7 days of her discharge, however this did not happen.  After discharge, she was seen by Dr. Lucianne Lei trigt on 03/29/2019, she was instructed to stop amiodarone.  After discharge, her sister has moved down here to stay with her and her husband.  Her family also hired helper who will assist her with bathing.  According to the patient, she has chronic intermittent chest pain that occur once every several months.  After the subxiphoid window placement, she did have incisional soreness however no obvious chest pain recently.  She denies any shortness of breath as well.  In the past year, she had a total of 4 falls, 3 of which mechanical falls where she tripped over something.  There was only one episode where she fell as result of dizzy spell when bending over to pick up a package off the floor.  She was brought back to [facility] on 04/15/2019 due to lethargy and altered mental status.  Initial lab work showed elevated CRP at 17.4, lactic acid level mildly elevated to 2.6.  High-sensitivity troponin trended up to 312 before trending back down.  Potassium on arrival was severely low at 2.6, this was later normalized.  Hemoglobin on arrival was 13, this trended down to 7.4 before trending  back up her usual level of 9.7.  Patient was admitted for sepsis.  Point-of-care Covid test was negative.  CT angiogram of chest and abdomen demonstrated mosaic pattern of groundglass attenuation likely related to small vessel disease versus hypersensitivity pneumonitis.  This test also demonstrated prior small nondisplaced fracture involving the left ischium which appears to have aggressive lytic process  was marked periostitis.  Patient was being evaluated by orthopedic surgery.  Surprisingly, she denies any significant pain on the side.  She is currently pending MRI of lumbar spine due to concern of cord compression.  Cardiology was consulted for abnormal CT given the fact patient is on amiodarone, and also borderline elevated troponin.   Heart Pathway Score:     Past Medical History:  Diagnosis Date  . Abdominal pain, epigastric 06/22/2013  . Abdominal pain, other specified site 12/24/2011  . Abnormal urinalysis 05/20/2017  . Acute respiratory failure with hypoxia ([place]) 05/20/2017  . Anemia, unspecified 10/28/2012  . Anxiety state, unspecified 10/28/2012  . Bruises easily    d/t being on prednisone and plavix  . CAD (coronary artery disease)    a. Stent RCA in [facility];  b. Cath approx 2009 - nonobs per pt report.  . Cataract    immature on the left eye  . Chronic insomnia 02/07/2013  . Chronic lower back pain    scoliosis  . CKD (chronic kidney disease) stage 3, GFR 30-59 ml/min   . Complication of anesthesia    pt has a very high tolerance to meds  . Coronary disease 05/20/2017  . CVA (cerebral infarction) 10/29/2012  . DDD (degenerative disc disease)   . Depression   . Diverticulitis    hx of  . Diverticulosis   . Elevated transaminase level 02/07/2013  . Enteritis   . GERD (gastroesophageal reflux disease) 09/01/2010  . Giant cell arteritis ([place])   . Hemorrhoids   . Herniated nucleus pulposus, L5-S1, right 11/04/2015  . History of scabies   . HTN (hypertension) 05/20/2017  . Hyperlipidemia    takes Lipitor daily  . Hypertension    takes Amlodipine,Losartan,Metoprolol,and HCTZ daily  . Incontinence of urine   . Influenza A 05/20/2017  . Insomnia    takes Restoril nightly  . Lumbar stenosis 04/25/2013  . Major depressive disorder, recurrent episode, moderate ([place]) 07/16/2013  . Migraines    "back in my 20's; none since" (10/28/2012)  . Osteoporosis   . PAF (paroxysmal  atrial fibrillation) ([place]) 2011   a. lone epidode in 2011 according to notes.  . Rheumatoid arthritis ([place])   . Scoliosis   . Sepsis ([place]) 05/20/2017  . Sinus bradycardia    a. on chronic bb  . Temporal arteritis ([place]) 2011   a. followed @ [facility]; potential flareup 10/28/2012/notes 10/28/2012  . Vocal cord dysfunction    "they don't operate properly" (10/28/2012)    Past Surgical History:  Procedure Laterality Date  . ABDOMINAL HYSTERECTOMY  ~ 1984   vaginally  . BACK SURGERY  7-56yrs ago   X Stop  . BLADDER SUSPENSION  2001  . BREAST BIOPSY Right   . CATARACT EXTRACTION W/ INTRAOCULAR LENS IMPLANT Right ~ 08/2012  . CHEST TUBE INSERTION Left 03/08/2019   Procedure: Chest Tube Insertion;  Surgeon: Ivin Poot, MD;  Location: [place];  Service: Thoracic;  Laterality: Left;  . COLONOSCOPY  01/26/2012   Procedure: COLONOSCOPY;  Surgeon: Ladene Artist, MD,FACG;  Location: [facility] ENDOSCOPY;  Service: Endoscopy;  Laterality: N/A;  note  the EGD is possible  . CORONARY ANGIOPLASTY WITH STENT PLACEMENT  2006   X 1 stent  . EPIDURAL BLOCK INJECTION    . ESOPHAGOGASTRODUODENOSCOPY  01/26/2012   Procedure: ESOPHAGOGASTRODUODENOSCOPY (EGD);  Surgeon: Ladene Artist, MD,FACG;  Location[facility] END[facility]COPY;  Service: Endoscopy;  Laterality: N/A;  . HEMIARTHROPLASTY HIP Right 2012  . LAPAROSCOPIC CHOLECYSTECTOMY  2001  . LUMBAR FUSION  03/2013  . LUMBAR LAMINECTOMY/DECOMPRESSION MICRODISCECTOMY Right 11/04/2015   Procedure: Right Lumbar Five-Sacral One Microdiskectomy;  Surgeon: Kristeen Miss, MD;  Location: [place] NEURO ORS;  Service: Neurosurgery;  Laterality: Right;  Right L5-S1 Microdiskectomy  . moles removed that required stiches     one on leg and one on face  . SUBXYPHOID PERICARDIAL WINDOW N/A 03/08/2019   Procedure: SUBXYPHOID PERICARDIAL WINDOW;  Surgeon: Ivin Poot, MD;  Location: [place];  Service: Thoracic;  Laterality: N/A;  . TEE WITHOUT CARDIOVERSION N/A 03/08/2019   Procedure: TRANSESOPHAGEAL  ECHOCARDIOGRAM (TEE);  Surgeon: Prescott Gum, Collier [place], MD;  Location: [place];  Service: Thoracic;  Laterality: N/A;  . TEMPORAL ARTERY BIOPSY / LIGATION Bilateral 2011  . TONSILLECTOMY AND ADENOIDECTOMY     at age 42  . TUBAL LIGATION  ~ 1982  . X-STOP IMPLANTATION  ~ 2010   "lower back" (10/28/2012)     Home Medications:  Prior to Admission medications   Medication Sig Start Date End Date Taking? Authorizing Provider  amiodarone (PACERONE) 200 MG tablet Take 200 mg by mouth daily.   Yes [provider]  cholecalciferol (VITAMIN D) 1000 UNITS tablet Take 1,000 Units by mouth daily.    Yes [provider]  dicyclomine (BENTYL) 20 MG tablet Take 1 tablet (20 mg total) by mouth 2 (two) times daily as needed for spasms. 01/12/19  Yes Gareth Morgan, MD  diltiazem (CARDIZEM) 60 MG tablet Take 1 tablet (60 mg total) by mouth 3 (three) times daily. 01/31/19  Yes McAlhany, Annita Brod, MD  ELIQUIS 5 MG TABS tablet TAKE 1 TABLET BY MOUTH TWICE A DAY Patient taking differently: Take 5 mg by mouth 2 (two) times daily.  11/15/18  Yes Burnell Blanks, MD  escitalopram (LEXAPRO) 10 MG tablet TAKE 1 TABLET BY MOUTH EVERY DAY Patient taking differently: Take 10 mg by mouth daily.  12/14/18  Yes Burchette, Alinda Sierras, MD  furosemide (LASIX) 20 MG tablet Take 2 tablets (40 mg total) by mouth 2 (two) times daily. 03/13/19 04/15/19 Yes Ikramullah, Mir Mohammed, MD  HYDROcodone-acetaminophen (NORCO/VICODIN) 5-325 MG tablet Take one tablet by mouth every 8 hours as needed for pain. May refill in two months. Patient taking differently: Take 1 tablet by mouth every 6 (six) hours as needed for moderate pain.  08/05/18  Yes Burchette, Alinda Sierras, MD  losartan (COZAAR) 100 MG tablet Take 100 mg by mouth daily.   Yes [provider]  metoprolol tartrate (LOPRESSOR) 100 MG tablet Take 1 tablet (100 mg total) by mouth 2 (two) times daily. 01/20/19  Yes Rai, Ripudeep K, MD  ondansetron (ZOFRAN ODT) 4 MG  disintegrating tablet Take 1 tablet (4 mg total) by mouth every 8 (eight) hours as needed for nausea or vomiting. 01/12/19  Yes Gareth Morgan, MD  pantoprazole (PROTONIX) 40 MG tablet TAKE 1 TABLET BY MOUTH EVERY DAY Patient taking differently: Take 40 mg by mouth daily.  02/17/19  Yes Burchette, Alinda Sierras, MD  potassium chloride SA (KLOR-CON) 20 MEQ tablet Take 1 tablet (20 mEq total) by mouth daily. 01/20/19  Yes Rai, Ripudeep  K, MD  predniSONE (DELTASONE) 10 MG tablet 4 tablets for 3 days, 3 tablets for 3 days, 2 tablets for 3 days, 1 tablet continue Patient taking differently: Take 10 mg by mouth daily.  05/26/17  Yes Reyne Dumas, MD  rosuvastatin (CRESTOR) 10 MG tablet Take 10 mg by mouth at bedtime. 03/05/19  Yes [provider]  vitamin C (ASCORBIC ACID) 500 MG tablet Take 1,000 mg by mouth daily.    Yes [provider]  vitamin E (VITAMIN E) 400 UNIT capsule Take 400 Units by mouth daily.   Yes [provider]  lidocaine (XYLOCAINE) 5 % ointment Apply 1 application topically 3 (three) times daily as needed. Patient not taking: Reported on 04/15/2019 04/13/19   Margette Fast, MD    Inpatient Medications: Scheduled Meds: . albuterol  2.5 mg Nebulization BID  . amiodarone  200 mg Oral Daily  . apixaban  5 mg Oral BID  . vitamin C  1,000 mg Oral Daily  . cholecalciferol  1,000 Units Oral Daily  . diltiazem  60 mg Oral TID  . escitalopram  10 mg Oral Daily  . ferrous sulfate  325 mg Oral BID WC  . metoprolol tartrate  100 mg Oral BID  . pantoprazole  40 mg Oral Daily  . predniSONE  30 mg Oral Q breakfast  . rosuvastatin  10 mg Oral QHS  . vitamin E  400 Units Oral Daily   Continuous Infusions: . azithromycin 500 mg (04/17/19 1131)  . ceFEPime (MAXIPIME) IV 2 g (04/18/19 0409)   PRN Meds: acetaminophen, dicyclomine, HYDROcodone-acetaminophen, HYDROmorphone (DILAUDID) injection, ondansetron (ZOFRAN) IV, phenol  Allergies:    Allergies  Allergen  Reactions  . Dextromethorphan Rash    Social History:   Social History   Socioeconomic History  . Marital status: Married    Spouse name: Not on file  . Number of children: 3  . Years of education: Not on file  . Highest education level: Not on file  Occupational History  . Occupation: Retired Cabin crew  Tobacco Use  . Smoking status: Never Smoker  . Smokeless tobacco: Never Used  Substance and Sexual Activity  . Alcohol use: Yes    Comment: socially  . Drug use: No  . Sexual activity: Not on file  Other Topics Concern  . Not on file  Social History Narrative   Lives in [place] with her husband.  Retired Cabin crew.   Social Determinants of Health   Financial Resource Strain:   . Difficulty of Paying Living Expenses: Not on file  Food Insecurity:   . Worried About Charity fundraiser in the Last Year: Not on file  . Ran Out of Food in the Last Year: Not on file  Transportation Needs:   . Lack of Transportation (Medical): Not on file  . Lack of Transportation (Non-Medical): Not on file  Physical Activity:   . Days of Exercise per Week: Not on file  . Minutes of Exercise per Session: Not on file  Stress:   . Feeling of Stress : Not on file  Social Connections:   . Frequency of Communication with Friends and Family: Not on file  . Frequency of Social Gatherings with Friends and Family: Not on file  . Attends Religious Services: Not on file  . Active Member of Clubs or Organizations: Not on file  . Attends Archivist Meetings: Not on file  . Marital Status: Not on file  Intimate Partner Violence:   .  Fear of Current or Ex-Partner: Not on file  . Emotionally Abused: Not on file  . Physically Abused: Not on file  . Sexually Abused: Not on file    Family History:    Family History  Problem Relation Age of Onset  . Brain cancer Mother 71  . Heart attack Father   . Breast cancer Daughter 80  . Heart disease Other   . Hypertension Other   . Arthritis  Neg Hx   . Colon cancer Neg Hx   . Osteoporosis Neg Hx      ROS:  Please see the history of present illness.   All other ROS reviewed and negative.     Physical Exam/Data:   Vitals:   04/17/19 2326 04/18/19 0452 04/18/19 0700 04/18/19 0800  BP: 128/63 (!) 159/76  (!) 172/82  Pulse: 60 60 (!) 57 63  Resp: 17 14 15 20   Temp: 98.6 F (37 C) 97.9 F (36.6 C)  98 F (36.7 C)  TempSrc: Oral Oral  Oral  SpO2: 98% 98% 97% 97%  Weight:      Height:        Intake/Output Summary (Last 24 hours) at 04/18/2019 1023 Last data filed at 04/18/2019 0451 Gross per 24 hour  Intake 0 ml  Output 650 ml  Net -650 ml   Last 3 Weights 04/15/2019 03/29/2019 03/13/2019  Weight (lbs) 155 lb 155 lb 169 lb 9.6 oz  Weight (kg) 70.308 kg 70.308 kg 76.93 kg     Body mass index is 26.19 kg/m.  General:  Well nourished, well developed, in no acute distress HEENT: normal Lymph: no adenopathy Neck: no JVD Endocrine:  No thryomegaly Vascular: No carotid bruits; FA pulses 2+ bilaterally without bruits  Cardiac:  normal S1, S2; RRR; no murmur  Lungs:  clear to auscultation bilaterally, no wheezing, rhonchi or rales  Abd: soft, nontender, no hepatomegaly  Ext: no edema Musculoskeletal:  No deformities, BUE and BLE strength normal and equal Skin: warm and dry  Neuro:  CNs 2-12 intact, no focal abnormalities noted Psych:  Normal affect   EKG:  The EKG was personally reviewed and demonstrates: Normal sinus rhythm with diffuse T wave inversion Telemetry:  Telemetry was personally reviewed and demonstrates: Normal sinus rhythm without any recurrent atrial fibrillation  Relevant CV Studies:  Echo 04/17/2019  1. Left ventricular ejection fraction, by visual estimation, is 60 to 65%. The left ventricle has normal function. There is mildly increased left ventricular hypertrophy.  2. Left ventricular diastolic parameters are consistent with Grade II diastolic dysfunction (pseudonormalization). The mean left  atrial pressure is high.  3. The left ventricle has no regional wall motion abnormalities.  4. Global right ventricle has normal systolic function.The right ventricular size is normal. No increase in right ventricular wall thickness.  5. Left atrial size was mildly dilated.  6. Right atrial size was mildly dilated.  7. Mild mitral annular calcification.  8. The mitral valve is normal in structure. Trivial mitral valve regurgitation.  9. The tricuspid valve is normal in structure. 10. The tricuspid valve is normal in structure. Tricuspid valve regurgitation is mild-moderate. 11. The aortic valve is normal in structure. Aortic valve regurgitation is trivial. 12. The pulmonic valve was not well visualized. Pulmonic valve regurgitation is not visualized. 13. Mildly elevated pulmonary artery systolic pressure. 14. The tricuspid regurgitant velocity is 2.18 m/s, and with an assumed right atrial pressure of 15 mmHg, the estimated right ventricular systolic pressure is mildly elevated at 34.0  mmHg. 15. The inferior vena cava is dilated in size with <50% respiratory variability, suggesting right atrial pressure of 15 mmHg. 16. There is exaggerated respiratory displacement of the interventricular septum. This could represent contrictive physiology , but can also be seen with increased work of breathing (e.g. COPD or asthma exacerbation). 57. Compared to 03/09/2019, left ventricular wall motion and overall systolic function have improved.  Laboratory Data:  High Sensitivity Troponin:   Recent Labs  Lab 04/15/19 1052 04/15/19 1331 04/15/19 1840 04/16/19 0706  TROPONINIHS 88* 174* 312* 168*     Chemistry Recent Labs  Lab 04/16/19 0706 04/17/19 0759 04/18/19 0241  NA 142 143 140  K 4.2 4.4 4.3  CL 109 109 108  CO2 21* 24 21*  GLUCOSE 125* 126* 135*  BUN 14 19 28*  CREATININE 1.06* 0.94 1.07*  CALCIUM 8.0* 8.8* 8.6*  GFRNONAA 49* 56* 48*  GFRAA 57* >60 56*  ANIONGAP 12 10 11     Recent  Labs  Lab 04/15/19 1052 04/16/19 0706  PROT 6.3* 5.3*  ALBUMIN 3.3* 2.4*  AST 46* 29  ALT 36 26  ALKPHOS 101 79  BILITOT 2.5* 2.1*   Hematology Recent Labs  Lab 04/16/19 0706 04/17/19 0243 04/17/19 0759 04/18/19 0241  WBC 15.5*  --  15.6* 12.8*  RBC 3.51* 3.08* 3.14* 2.98*  HGB 10.6*  --  9.3* 8.6*  HCT 33.5*  --  29.5* 27.4*  MCV 95.4  --  93.9 91.9  MCH 30.2  --  29.6 28.9  MCHC 31.6  --  31.5 31.4  RDW 15.9*  --  15.3 15.1  PLT 146*  --  150 161   BNP Recent Labs  Lab 04/15/19 1052  BNP 170.2*    DDimer No results for input(s): DDIMER in the last 168 hours.   Radiology/Studies:  CT Angio Chest PE W and/or Wo Contrast  Result Date: 04/15/2019 CLINICAL DATA:  Shortness of breath.  Nausea and vomiting. EXAM: CT ANGIOGRAPHY CHEST CT ABDOMEN AND PELVIS WITH CONTRAST TECHNIQUE: Multidetector CT imaging of the chest was performed using the standard protocol during bolus administration of intravenous contrast. Multiplanar CT image reconstructions and MIPs were obtained to evaluate the vascular anatomy. Multidetector CT imaging of the abdomen and pelvis was performed using the standard protocol during bolus administration of intravenous contrast. CONTRAST:  112mL OMNIPAQUE IOHEXOL 350 MG/ML SOLN COMPARISON:  Abdominal CT scan 03/06/2019 the FINDINGS: CTA CHEST FINDINGS Cardiovascular: The heart is normal in size and stable. There is stable prominent pericardial fat and a small persistent pericardial effusion. The aorta is normal in caliber. Scattered atherosclerotic calcifications. Stable coronary artery calcifications. The pulmonary arterial tree is well opacified. No filling defects to suggest pulmonary embolism. Mediastinum/Nodes: Stable scattered mediastinal and hilar lymph nodes and scattered calcified nodes. The esophagus is grossly normal. Lungs/Pleura: Mosaic pattern of ground-glass attenuation in the lungs diffusely likely due to small airways disease such as asthma,  constrictive, acute or respiratory bronchiolitis, hypersensitivity pneumonitis or cryptogenic organizing pneumonia. No focal infiltrates or worrisome pulmonary lesions. There are patchy areas of subsegmental atelectasis and a small right pleural effusion. Musculoskeletal: No breast masses, supraclavicular or axillary adenopathy. The bony thorax is intact. Vertebral augmentation changes are noted at T11. Review of the MIP images confirms the above findings. CT ABDOMEN and PELVIS FINDINGS Hepatobiliary: No focal hepatic lesions are identified. There is mild intra and extrahepatic biliary dilatation likely due to prior cholecystectomy. The portal and hepatic veins are patent. Pancreas: No mass, inflammation or ductal  dilatation. Stable moderate atrophy. Spleen: Normal size.  Small calcified granulomas. Adrenals/Urinary Tract: The adrenal glands and kidneys are unremarkable. The bladder appears normal. Stomach/Bowel: The stomach, duodenum, small bowel and colon are grossly normal. No acute inflammatory changes, mass lesions or obstructive findings. The terminal ileum and appendix are normal. Descending colon and sigmoid colon diverticulosis but no findings for acute diverticulitis. Vascular/Lymphatic: Advanced atherosclerotic calcifications involving the aorta and iliac arteries. No aneurysm or dissection. The branch vessels are patent. The major venous structures are patent. No mesenteric or retroperitoneal mass or adenopathy. Small scattered lymph nodes are stable. Reproductive: Surgically absent. Other: No pelvic mass or free pelvic fluid collections. No inguinal mass or adenopathy. Musculoskeletal: A stable appearing right hip prosthesis. Advanced degenerative changes involving the left hip. Postoperative changes involving the spine with marked lytic appearance of L2, L3 and L4. T12 also appears somewhat expanded and lytic. There is also a aggressive lytic lesion involving the left ischium and marked periostitis.  Findings suspicious for osseous metastatic disease. Review of the MIP images confirms the above findings. IMPRESSION: 1. No CT findings for pulmonary embolism. 2. No aortic aneurysm or dissection. 3. Persistent small pericardial effusion and prominent pericardial fat. 4. Mosaic pattern of ground-glass attenuation throughout the lungs most likely small airways disease. Please see above discussion. 5. Small right pleural effusion and overlying atelectasis. 6. No acute abdominal/pelvic findings, mass lesions or adenopathy. 7. Prior small nondisplaced fracture involving the left ischium now appears to be an aggressive lytic process with marked periostitis. There is also marked lucency of the L2, L3 and L4 vertebral bodies and possibly T12. Could not exclude metastatic disease. Electronically Signed   By: Marijo Sanes M.D.   On: 04/15/2019 13:50   MR THORACIC SPINE W WO CONTRAST  Result Date: 04/16/2019 CLINICAL DATA:  Back pain. Abnormal CT EXAM: MRI THORACIC WITHOUT AND WITH CONTRAST TECHNIQUE: Multiplanar and multiecho pulse sequences of the thoracic spine were obtained without and with intravenous contrast. CONTRAST:  32mL GADAVIST GADOBUTROL 1 MMOL/ML IV SOLN COMPARISON:  CT 04/15/2019 FINDINGS: MRI THORACIC SPINE FINDINGS Alignment: Thoracic vertebral alignment is maintained without significant listhesis. Localizer sequences which include the cervical spine demonstrate grade 1 anterolisthesis C3 on C4, C4 on C5, and C5 on C6. Vertebrae: Low T1-T2 signal changes within the T11 and L1 vertebral segments compatible with prior cement augmentation. No acute fractures. No suspicious bone lesion. Scattered intraosseous hemangiomas including within the T8 and T9 vertebrae. No evidence of discitis. Cord: Increased cord signal at T10-11 and T11-12 where there is high-grade canal stenosis and cord compression (series 36, image 29; series 35, image 9). The remaining thoracic cord is otherwise normal in signal and caliber.  Paraspinal and other soft tissues: Small right pleural effusion. Disc levels: T1-T2: Mild left foraminal narrowing secondary to facet hypertrophy. No canal stenosis. T2-T3: Mild diffuse disc bulge without foraminal or canal stenosis. T3-T4: Mild diffuse disc bulge without foraminal or canal stenosis. T4-T5: No significant canal or neural foraminal narrowing T5-T6: No significant canal or neural foraminal narrowing T6-T7: No significant canal or neural foraminal narrowing T7-T8: No significant canal or neural foraminal narrowing T8-T9: No significant canal or neural foraminal narrowing T9-T10: No significant canal or neural foraminal narrowing T10-T11: Left paracentral disc bulge and posterior element hypertrophy result in moderate to severe canal stenosis with compression of the cord and increased T2 cord signal. There is moderate to severe left and mild right foraminal stenosis. T11-T12: Diffuse disc bulge, eccentric to the left, posterior element  hypertrophy, and prominent posterior epidural fat contribute to moderate to severe canal stenosis with compression of the cord and increased T2 cord signal. Severe left foraminal stenosis. IMPRESSION: 1. 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 2. Severe left foraminal stenosis at T11-T12 and severe left foraminal stenosis at T11-T12. 3. No acute fracture or suspicious marrow replacing lesion. 4. Small right pleural effusion. Electronically Signed   By: Davina Poke D.O.   On: 04/16/2019 12:47   MR Lumbar Spine W Wo Contrast  Addendum Date: 04/16/2019   ADDENDUM REPORT: 04/16/2019 13:12 ADDENDUM: Findings were discussed via telephone with Dr. Tyrell Antonio at 1:10 p.m. on 04/16/2019 by Dr. Davina Poke. Electronically Signed   By: Davina Poke D.O.   On: 04/16/2019 13:12   Result Date: 04/16/2019 CLINICAL DATA:  Abnormal  appearance of the lumbar spine on recent CT. Concern for osseous metastatic disease. History of lumbar fusion EXAM: MRI LUMBAR SPINE WITHOUT AND WITH CONTRAST TECHNIQUE: Multiplanar and multiecho pulse sequences of the lumbar spine were obtained without and with intravenous contrast. CONTRAST:  51mL GADAVIST GADOBUTROL 1 MMOL/ML IV SOLN COMPARISON:  CT 04/15/2019. MRI 08/13/2016 FINDINGS: Segmentation:  Standard. Alignment:  Lumbar levocurvature. Trace retrolisthesis L1 on L2. Vertebrae: Prior posterior instrumented fusion and decompression at L2-L4. Susceptibility artifact related to hardware slightly degrades evaluation of the adjacent structures. Low T1/T2 signal related to prior cement augmentation within the L1 and T11 vertebral bodies. Bone marrow signal is mildly heterogeneous and largely fatty replaced. No evidence of fracture, marrow replacing lesion, or discitis. Conus medullaris and cauda equina: Conus extends to the L1-L2 level. Cauda equina appear normal. The lower cord is mildly increased in signal the T11-12 through T12-L1 levels. Paraspinal and other soft tissues: Negative. Disc levels: T10-T11: Sagittal sequences only. Diffuse disc bulge and posterior element hypertrophy results in moderate to severe canal stenosis with evidence the cord compression (series 9, image 8). T11-T12: Sagittal sequences only. Diffuse disc bulge and posterior element hypertrophy result in moderate to severe canal stenosis with evidence of cord compression (series 9, image 8). T12-L1: Diffuse disc bulge and posterior element hypertrophy result in moderate canal stenosis and mild-to-moderate bilateral foraminal stenosis, left worse than right. L1-L2: Slight retropulsion and endplate ridging with mild posterior element hypertrophy resulting in mild-to-moderate right foraminal stenosis and mild canal stenosis. L2-L3: Prior fusion and decompression without evidence of significant residual foraminal or canal stenosis. L3-L4:  Prior fusion and decompression without evidence of significant residual foraminal or canal stenosis. L4-L5: Diffuse disc bulge with right paracentral extrusion with 16 mm caudal extension of disc material (series 13, image 8; series 11, image 30). Moderate to severe canal stenosis at this level. Moderate left foraminal stenosis with prominent left-sided facet arthropathy contributing. L5-S1: Mild diffuse disc bulge with endplate ridging and bilateral facet arthropathy. Severe right and mild left foraminal stenosis and mild-to-moderate canal stenosis. IMPRESSION: 1. No acute fracture, marrow replacing lesion, or evidence of discitis. 2. Moderate to severe multifactorial canal stenosis at T10-T11 and T11-12 with evidence of cord compression. Subtly increased STIR signal within the cord at these levels suggestive of compressive myelopathy. 3. Moderate to severe canal stenosis at T12-L1 and L4-5. 4. Multilevel bilateral foraminal stenosis most severe on the right at L5-S1. 5. Prior posterior instrumented fusion and decompression at L2-L3 and L3-L4. No significant residual foraminal or canal stenosis at fused levels. These results will be called to the  ordering clinician or representative by the Radiologist Assistant, and communication documented in the PACS or zVision Dashboard. Electronically Signed: By: Davina Poke D.O. On: 04/16/2019 12:28   MR PELVIS W WO CONTRAST  Result Date: 04/17/2019 CLINICAL DATA:  Evaluate left ischial lesion seen on recent CT scan. EXAM: MRI PELVIS WITHOUT AND WITH CONTRAST TECHNIQUE: Multiplanar multisequence MR imaging of the pelvis was performed both before and after administration of intravenous contrast. CONTRAST:  51mL GADAVIST GADOBUTROL 1 MMOL/ML IV SOLN COMPARISON:  CT scan 04/15/2019 FINDINGS: Diffuse low T1 and high T2 signal intensity in the ischial tuberosity without definite soft tissue component. On the coronal images this appears to be fairly well circumscribed and  has the appearance of a progressive avulsion fracture at the ischial tuberosity with probable aggressive callus formation due to motion by the pull of the hamstring tendons. I do not see a definite neoplastic process. No other pelvic lesions are identified. Moderate edema like signal changes in the left hip musculature, most notably the gluteus minimus and medius muscles and the abductor muscles likely muscle tear/muscle strains. Similar findings involving the right abductor muscles. No significant intrapelvic abnormalities. Moderate artifact from the right hip prosthesis. IMPRESSION: 1. Progressive avulsion type fracture at the left ischial tuberosity with more likely an aggressive pattern of callus formation than an actual bone lesion causing the periostitis. No definite soft tissue mass is identified and no other bone lesions are identified. 2. Recommend follow-up CT scan in 4-6 weeks to reassess healing changes. Electronically Signed   By: Marijo Sanes M.D.   On: 04/17/2019 16:27   CT ABDOMEN PELVIS W CONTRAST  Result Date: 04/15/2019 CLINICAL DATA:  Shortness of breath.  Nausea and vomiting. EXAM: CT ANGIOGRAPHY CHEST CT ABDOMEN AND PELVIS WITH CONTRAST TECHNIQUE: Multidetector CT imaging of the chest was performed using the standard protocol during bolus administration of intravenous contrast. Multiplanar CT image reconstructions and MIPs were obtained to evaluate the vascular anatomy. Multidetector CT imaging of the abdomen and pelvis was performed using the standard protocol during bolus administration of intravenous contrast. CONTRAST:  168mL OMNIPAQUE IOHEXOL 350 MG/ML SOLN COMPARISON:  Abdominal CT scan 03/06/2019 the FINDINGS: CTA CHEST FINDINGS Cardiovascular: The heart is normal in size and stable. There is stable prominent pericardial fat and a small persistent pericardial effusion. The aorta is normal in caliber. Scattered atherosclerotic calcifications. Stable coronary artery calcifications.  The pulmonary arterial tree is well opacified. No filling defects to suggest pulmonary embolism. Mediastinum/Nodes: Stable scattered mediastinal and hilar lymph nodes and scattered calcified nodes. The esophagus is grossly normal. Lungs/Pleura: Mosaic pattern of ground-glass attenuation in the lungs diffusely likely due to small airways disease such as asthma, constrictive, acute or respiratory bronchiolitis, hypersensitivity pneumonitis or cryptogenic organizing pneumonia. No focal infiltrates or worrisome pulmonary lesions. There are patchy areas of subsegmental atelectasis and a small right pleural effusion. Musculoskeletal: No breast masses, supraclavicular or axillary adenopathy. The bony thorax is intact. Vertebral augmentation changes are noted at T11. Review of the MIP images confirms the above findings. CT ABDOMEN and PELVIS FINDINGS Hepatobiliary: No focal hepatic lesions are identified. There is mild intra and extrahepatic biliary dilatation likely due to prior cholecystectomy. The portal and hepatic veins are patent. Pancreas: No mass, inflammation or ductal dilatation. Stable moderate atrophy. Spleen: Normal size.  Small calcified granulomas. Adrenals/Urinary Tract: The adrenal glands and kidneys are unremarkable. The bladder appears normal. Stomach/Bowel: The stomach, duodenum, small bowel and colon are grossly normal. No acute inflammatory changes, mass lesions or  obstructive findings. The terminal ileum and appendix are normal. Descending colon and sigmoid colon diverticulosis but no findings for acute diverticulitis. Vascular/Lymphatic: Advanced atherosclerotic calcifications involving the aorta and iliac arteries. No aneurysm or dissection. The branch vessels are patent. The major venous structures are patent. No mesenteric or retroperitoneal mass or adenopathy. Small scattered lymph nodes are stable. Reproductive: Surgically absent. Other: No pelvic mass or free pelvic fluid collections. No  inguinal mass or adenopathy. Musculoskeletal: A stable appearing right hip prosthesis. Advanced degenerative changes involving the left hip. Postoperative changes involving the spine with marked lytic appearance of L2, L3 and L4. T12 also appears somewhat expanded and lytic. There is also a aggressive lytic lesion involving the left ischium and marked periostitis. Findings suspicious for osseous metastatic disease. Review of the MIP images confirms the above findings. IMPRESSION: 1. No CT findings for pulmonary embolism. 2. No aortic aneurysm or dissection. 3. Persistent small pericardial effusion and prominent pericardial fat. 4. Mosaic pattern of ground-glass attenuation throughout the lungs most likely small airways disease. Please see above discussion. 5. Small right pleural effusion and overlying atelectasis. 6. No acute abdominal/pelvic findings, mass lesions or adenopathy. 7. Prior small nondisplaced fracture involving the left ischium now appears to be an aggressive lytic process with marked periostitis. There is also marked lucency of the L2, L3 and L4 vertebral bodies and possibly T12. Could not exclude metastatic disease. Electronically Signed   By: Marijo Sanes M.D.   On: 04/15/2019 13:50   DG Chest Port 1 View  Result Date: 04/15/2019 CLINICAL DATA:  Altered mental status. EXAM: PORTABLE CHEST 1 VIEW COMPARISON:  March 29, 2019 FINDINGS: Cardiomediastinal silhouette is enlarged. Mediastinal contours appear intact. There is no evidence of pleural effusion or pneumothorax. Bilateral lower lobe atelectasis versus mild peribronchial airspace consolidation. Osseous structures are without acute abnormality. Soft tissues are grossly normal. IMPRESSION: 1. Bilateral lower lobe atelectasis versus mild peribronchial airspace consolidation. 2. Enlarged cardiac silhouette. Electronically Signed   By: Fidela Salisbury M.D.   On: 04/15/2019 11:40   ECHOCARDIOGRAM COMPLETE  Result Date: 04/17/2019    ECHOCARDIOGRAM REPORT   Patient Name:   CHRYSTELLE BAGAN Date of Exam: 04/17/2019 Medical Rec #:  [id number]    Height:       64.5 in Accession #:    [id number]   Weight:       155.0 lb Date of Birth:  1934-07-23    BSA:          1.76 m Patient Age:    23 years     BP:           148/67 mmHg Patient Gender: F            HR:           63 bpm. Exam Location:  Inpatient Procedure: 2D Echo Indications:    Elevated troponin  History:        Patient has prior history of Echocardiogram examinations, most                 recent 03/09/2019. Arrythmias:Atrial Fibrillation. CKD. CAD. H/o                 stroke.  Sonographer:    Clayton Lefort RDCS (AE) Referring Phys: [id number] BELKYS A REGALADO IMPRESSIONS  1. Left ventricular ejection fraction, by visual estimation, is 60 to 65%. The left ventricle has normal function. There is mildly increased left ventricular hypertrophy.  2. Left ventricular diastolic parameters are consistent with  Grade II diastolic dysfunction (pseudonormalization). The mean left atrial pressure is high.  3. The left ventricle has no regional wall motion abnormalities.  4. Global right ventricle has normal systolic function.The right ventricular size is normal. No increase in right ventricular wall thickness.  5. Left atrial size was mildly dilated.  6. Right atrial size was mildly dilated.  7. Mild mitral annular calcification.  8. The mitral valve is normal in structure. Trivial mitral valve regurgitation.  9. The tricuspid valve is normal in structure. 10. The tricuspid valve is normal in structure. Tricuspid valve regurgitation is mild-moderate. 11. The aortic valve is normal in structure. Aortic valve regurgitation is trivial. 12. The pulmonic valve was not well visualized. Pulmonic valve regurgitation is not visualized. 13. Mildly elevated pulmonary artery systolic pressure. 14. The tricuspid regurgitant velocity is 2.18 m/s, and with an assumed right atrial pressure of 15 mmHg, the estimated right ventricular  systolic pressure is mildly elevated at 34.0 mmHg. 15. The inferior vena cava is dilated in size with <50% respiratory variability, suggesting right atrial pressure of 15 mmHg. 16. There is exaggerated respiratory displacement of the interventricular septum. This could represent contrictive physiology , but can also be seen with increased work of breathing (e.g. COPD or asthma exacerbation). 76. Compared to 03/09/2019, left ventricular wall motion and overall systolic function have improved. FINDINGS  Left Ventricle: Left ventricular ejection fraction, by visual estimation, is 60 to 65%. The left ventricle has normal function. The left ventricle has no regional wall motion abnormalities. The left ventricular internal cavity size was the left ventricle is normal in size. There is mildly increased left ventricular hypertrophy. Concentric left ventricular hypertrophy. Left ventricular diastolic parameters are consistent with Grade II diastolic dysfunction (pseudonormalization). Right Ventricle: The right ventricular size is normal. No increase in right ventricular wall thickness. Global RV systolic function is has normal systolic function. The tricuspid regurgitant velocity is 2.18 m/s, and with an assumed right atrial pressure  of 15 mmHg, the estimated right ventricular systolic pressure is mildly elevated at 34.0 mmHg. Left Atrium: Left atrial size was mildly dilated. Right Atrium: Right atrial size was mildly dilated Pericardium: There is no evidence of pericardial effusion. Mitral Valve: The mitral valve is normal in structure. Mild mitral annular calcification. Trivial mitral valve regurgitation. MV peak gradient, 4.4 mmHg. Tricuspid Valve: The tricuspid valve is normal in structure. Tricuspid valve regurgitation is mild-moderate. Aortic Valve: The aortic valve is normal in structure. Aortic valve regurgitation is trivial. Aortic regurgitation PHT measures 506 msec. Aortic valve mean gradient measures 3.0 mmHg.  Aortic valve peak gradient measures 4.0 mmHg. Aortic valve area, by VTI measures 2.14 cm. Pulmonic Valve: The pulmonic valve was not well visualized. Pulmonic valve regurgitation is not visualized. Pulmonic regurgitation is not visualized. Aorta: The aortic root and ascending aorta are structurally normal, with no evidence of dilitation. Venous: The inferior vena cava is dilated in size with less than 50% respiratory variability, suggesting right atrial pressure of 15 mmHg. IAS/Shunts: No atrial level shunt detected by color flow Doppler.  LEFT VENTRICLE PLAX 2D LVIDd:         4.40 cm LVIDs:         3.40 cm LV PW:         1.30 cm LV IVS:        1.30 cm LVOT diam:     2.00 cm LV SV:         40 ml LV SV Index:   22.32 LVOT Area:  3.14 cm  RIGHT VENTRICLE            IVC RV Basal diam:  3.00 cm    IVC diam: 2.70 cm RV S prime:     9.46 cm/s TAPSE (M-mode): 2.4 cm LEFT ATRIUM             Index       RIGHT ATRIUM           Index LA diam:        4.50 cm 2.55 cm/m  RA Area:     19.80 cm LA Vol (A2C):   75.8 ml 42.95 ml/m RA Volume:   50.70 ml  28.73 ml/m LA Vol (A4C):   56.3 ml 31.90 ml/m LA Biplane Vol: 65.9 ml 37.34 ml/m  AORTIC VALVE AV Area (Vmax):    2.30 cm AV Area (Vmean):   2.20 cm AV Area (VTI):     2.14 cm AV Vmax:           99.70 cm/s AV Vmean:          78.600 cm/s AV VTI:            0.267 m AV Peak Grad:      4.0 mmHg AV Mean Grad:      3.0 mmHg LVOT Vmax:         73.10 cm/s LVOT Vmean:        55.100 cm/s LVOT VTI:          0.182 m LVOT/AV VTI ratio: 0.68 AI PHT:            506 msec  AORTA Ao Root diam: 3.00 cm Ao Asc diam:  3.30 cm MITRAL VALVE              TRICUSPID VALVE MV Area (PHT): 2.44 cm   TR Peak grad:   19.0 mmHg MV Peak grad:  4.4 mmHg   TR Vmax:        239.00 cm/s MV Mean grad:  1.0 mmHg MV Vmax:       1.05 m/s   SHUNTS MV Vmean:      50.4 cm/s  Systemic VTI:  0.18 m MV VTI:        0.31 m     Systemic Diam: 2.00 cm MV PHT:        90.00 msec  Dani Gobble Croitoru MD Electronically signed by  Sanda Klein MD Signature Date/Time: 04/17/2019/10:09:57 PM    Final    US Abdomen Limited RUQ  Result Date: 04/15/2019 CLINICAL DATA:  Elevated liver function tests. EXAM: ULTRASOUND ABDOMEN LIMITED RIGHT UPPER QUADRANT COMPARISON:  CT of the abdomen April 15, 2019 FINDINGS: Gallbladder: Post cholecystectomy.  No focal fluid collection. Common bile duct: Diameter: 6.6 mm Liver: No focal lesion identified. Within normal limits in parenchymal echogenicity. Portal vein is patent on color Doppler imaging with normal direction of blood flow towards the liver. Other: None. IMPRESSION: Normal right upper quadrant ultrasound post cholecystectomy. Electronically Signed   By: Fidela Salisbury M.D.   On: 04/15/2019 16:00     Assessment and Plan:   1. Elevated troponin  -Elevated troponin occurred in the setting of altered mental status with possible sepsis and elevated lactic acid level.  -The only chest pain she has been having is incision line soreness from the subxiphoid window surgery recently.  -Although she had mild drop in ejection fraction of her subxiphoid window placement, however repeat echocardiogram during this admission showed EF has normalized.  -Her  overall symptom is inconsistent with ACS, however her EKG does show diffuse T wave inversion.  I question the validity of her most recent EKG, current so T wave inversion seems to be new and sudden.  -We will repeat EKG, if T wave inversion normalized, I would not recommend any further ischemic work-up.  Addendum: reviewed EKGs with Dr. Gardiner Rhyme, there does appears to be new TWI in anterolateral leads when compare to previous EKG in 2019, we likely will consider myoview either as inpatient or outpatient depend on if she will proceed with surgery during this admission.   2. Abnormal CT chest: Mosaic pattern of groundglass attenuation noted which could represent small airway disease versus pneumonitis.  Patient was started on amiodarone  therapy to help suppress atrial fibrillation with RVR after subxiphoid window in December 2020.  This was supposed to be of short course of therapy.  Based on the last office note by Dr. Prescott Gum, he plans to discontinue amiodarone  -Point-of-care Covid test was negative during this admission.  3. PAF: Maintaining sinus rhythm.  May discontinue amiodarone therapy.  If surgery is planned, may hold Eliquis  4. Left ischium fracture: Nondisplaced, found incidentally on CT of abdomen and pelvis.  Patient is currently being evaluated by orthopedic surgery.  According to orthopedic surgery, she would be a less ideal surgical candidate given history of severe osteoporosis.  Dr. Ellene Route is also concerned of cord compression and plans to discuss with her further today.  5. CAD: Last cardiac catheterization was in 2014 which showed patent RCA stent and mild disease in left circumflex and LAD.  6. Hypertension  7. Hyperlipidemia  8. Preoperative clearance: She was previously scheduled for right sacroplasty by Dr. Ellene Route on 05/29/2019.  Currently being evaluated by Dr. Ellene Route for possibility of cord compression and to decide whether or not she would be a surgical candidate.  She is scheduled to see Dr. Angelena Form near the end of February.  If she does require surgery during this admission, I think she will be acceptable candidate to proceed after holding Eliquis for 3 days.  This is assume repeat EKG does not continue to show diffuse T wave inversion.  Will review with MD.       For questions or updates, please contact [place] Please consult [email or web address] for contact info under     Hilbert Corrigan, [place]  04/18/2019 10:23 AM   Patient seen and examined.  Agree with above documentation.  Patient is an 85 year old female with a history of paroxysmal atrial fibrillation, CAD with stent to RCA in 2005, hypertension, hyperlipidemia, rheumatoid arthritis, CKD stage III, stroke who presented with altered  mental status.  Found to have sepsis, possibly related to pneumonia.  CT abdomen/pelvis showed left ischium lytic lesions.  Cardiology consulted for elevated troponin and concern for amiodarone toxicity.  She was admitted with diverticulitis in October 2020 and had recurrent AF at that time.  She was rate controlled with dilitazem and metoprolol, and converted back to sinus rhythm.  She was admitted in December 2020 with hypotension, and TTE showed moderate pericardial effusion.  Underwent pericardial window by Dr Prescott Gum on 03/08/19.  Postoperatively developed AF, and was started on amiodarone.  Dr Prescott Gum recommended stopping amiodarone on 12/30, but looks like this was not done.  She presented to [facility] on 04/15/19 with AMS and admitted for sepsis, likely from pneumonia.  COVID test was negative.  Hs-TN 88->174->312->168.  She denies any chest pain.  CTA chest showed  groundglass attenuation likely related to small vessel disease versus hypersensitivity pneumonitis.  She is also being seen by orthopedic surgery for  fracture involving the left ischium.  On exam, patient is alert and oriented, regular rate and rhythm, no murmurs, lungs bibasilar crackles, no LE edema, +JVD.  EKG from today personally reviewed and shows sinus rhythm, rate 61 bpm, first degree AV block, nonspecific diffuse T wave flattening.  Telemetry personally reviewed and shows sinus rhythm, rate in 60s.  For her atrial fibrillation, recommend discontinuing amiodarone.  She has maintained sinus rhythm, and given findings on CT which could represent amio toxicity, recommend discontinuing.    For her troponin elevation, suspect this is demand ischemia in setting of presenting with sepsis.  Lack of chest pain or new WMA on TTE argues against an acute coronary syndrome.  Can consider ischemia evaluation as outpatient once recovers from acute illness.  Donato Heinz, MD

## 2019-04-18 NOTE — Evaluation (Signed)
Occupational Therapy Evaluation Patient Details Name: Betty Jackson MRN: [id number] DOB: [birth year] Today's Date: 04/18/2019    History of Present Illness 85 year old with past medical history significant for CKD stage III, CAD, stroke, temporal arteritis, RA on chronic steroids, status post L2-3, L3-L4 posterior fusion by Dr. Ellene Route in 2015 and L5-S1 microdiscectomy by Dr. Ellene Route  in 2017. Patient DTR note pt has been limping, w/complaints worsening back pain.  Pt found lethargic and confused by DTR morning of admission. MRI pelvis showing L ischial tuberosity lesion concerning for metastatic disease and MRI of spine showing compressive myelopathy.    Clinical Impression   Patient is an 85 year old female that lives in a single level home with no steps to enter. Patient lives with her spouse who has assist from [place], however patient states "he's in better shape than me." Patient's sister moved in December to help take care of patient, and she has hired help for meal prep, house keeping. Patient reports [facility] "has been great" but she asked [facility] speech and OT to stop coming as she did not find it helpful. Currently, patient is min A with max cues for safety with body mechanics and use of walker during transfers, set up A for UB ADL and max-total A for LB ADLs due to increased pain, weakness, decreased activity tolerance. Recommend continued acute OT services to maximize patient safety and independence with self care.     Follow Up Recommendations  No OT follow up;Supervision/Assistance - 24 hour;Other (comment)(patient did not like previous HH OT/politely declined)    Equipment Recommendations  None recommended by OT       Precautions / Restrictions Precautions Precautions: Fall Precaution Comments: back pain Restrictions Weight Bearing Restrictions: No      Mobility Bed Mobility               General bed mobility comments: seated on commode upon arrival  Transfers Overall transfer  level: Needs assistance Equipment used: Rolling walker (2 wheeled) Transfers: Sit to/from Stand Sit to Stand: Min assist         General transfer comment: min A to power up and cues for safety with body mechanics    Balance Overall balance assessment: Needs assistance Sitting-balance support: Feet supported;No upper extremity supported Sitting balance-[name]: Fair     Standing balance support: Bilateral upper extremity supported;During functional activity Standing balance-[name]: Poor Standing balance comment: reliant on external support                           ADL either performed or assessed with clinical judgement   ADL Overall ADL's : Needs assistance/impaired     Grooming: Wash/dry hands;Set up;Sitting   Upper Body Bathing: Set up;Sitting   Lower Body Bathing: Maximal assistance;Total assistance;Sitting/lateral leans;Sit to/from stand   Upper Body Dressing : Set up;Sitting   Lower Body Dressing: Total assistance;Sitting/lateral leans Lower Body Dressing Details (indicate cue type and reason): to don socks due to decreased activity tolerance after using bedside commode Toilet Transfer: Minimal assistance;+2 for safety/equipment;Cueing for safety;Cueing for sequencing;RW;BSC Toilet Transfer Details (indicate cue type and reason): decreased safety with body mechanics Toileting- Clothing Manipulation and Hygiene: Minimal assistance;Sit to/from stand Toileting - Clothing Manipulation Details (indicate cue type and reason): for standing balance during peri care after bowel movement     Functional mobility during ADLs: Minimal assistance;+2 for safety/equipment;Rolling walker;Cueing for sequencing;Cueing for safety General ADL Comments: patient requiring increased assistance for  self care tasks due to decreased activity tolerance, pain in back/hips, weakness                   Pertinent Vitals/Pain Pain Assessment: Faces Faces Pain Scale: Hurts  even more Pain Location: back, hips Pain Descriptors / Indicators: Aching;Discomfort Pain Intervention(s): Limited activity within patient's tolerance     Hand Dominance Right   Extremity/Trunk Assessment Upper Extremity Assessment Upper Extremity Assessment: Generalized weakness(bilateral hand ulnar drift )   Lower Extremity Assessment Lower Extremity Assessment: Defer to PT evaluation   Cervical / Trunk Assessment Cervical / Trunk Assessment: Kyphotic   Communication Communication Communication: HOH   Cognition Arousal/Alertness: Awake/alert Behavior During Therapy: WFL for tasks assessed/performed Overall Cognitive Status: No family/caregiver present to determine baseline cognitive functioning                                 General Comments: patient requires cues for safety for mobility, transfers during session. answers questions appropriately   General Comments  VSS on room air            Home Living Family/patient expects to be discharged to:: Private residence Living Arrangements: Spouse/significant other;Other relatives;Other (Comment)(sister; hired caregiving assist) Available Help at Discharge: Family;Personal care attendant;Available 24 hours/day Type of Home: House Home Access: Level entry     Home Layout: One level     Bathroom Shower/Tub: Occupational psychologist: Handicapped height Bathroom Accessibility: Yes How Accessible: Accessible via walker Home Equipment: [place] - 4 wheels;Bedside commode;Shower seat;Grab bars - tub/shower   Additional Comments: patient has caregiver that assists with meal prep, cleaning bathroom, changes bed linens. Patient reports additional house keeper assist x1/week      Prior Functioning/Environment Level of Independence: Needs assistance  Gait / Transfers Assistance Needed: uses rollator ADL's / Homemaking Assistance Needed: performs BADL's on her own, was getting houskeeping help and has  aide from [place] who helps with her spouse. Patient's sister also moved in to help care for patient            OT Problem List: Decreased strength;Decreased activity tolerance;Impaired balance (sitting and/or standing);Decreased cognition;Decreased safety awareness;Pain;Decreased knowledge of use of DME or AE      OT Treatment/Interventions: Self-care/ADL training;Therapeutic exercise;Energy conservation;DME and/or AE instruction;Therapeutic activities;Cognitive remediation/compensation;Patient/family education;Balance training    OT Goals(Current goals can be found in the care plan section) Acute Rehab OT Goals Patient Stated Goal: to go home OT Goal Formulation: With patient Time For Goal Achievement: 05/02/19 Potential to Achieve Goals: Good  OT Frequency: Min 2X/week           Co-evaluation PT/OT/SLP Co-Evaluation/Treatment: Yes Reason for Co-Treatment: To address functional/ADL transfers;Complexity of the patient's impairments (multi-system involvement)   OT goals addressed during session: ADL's and self-care      AM-PAC OT "6 Clicks" Daily Activity     Outcome Measure Help from another person eating meals?: None Help from another person taking care of personal grooming?: A Little Help from another person toileting, which includes using toliet, bedpan, or urinal?: A Little Help from another person bathing (including washing, rinsing, drying)?: A Lot Help from another person to put on and taking off regular upper body clothing?: A Little Help from another person to put on and taking off regular lower body clothing?: A Lot 6 Click Score: 17   End of Session Equipment Utilized During Treatment: Rolling walker Nurse Communication: Mobility status  Activity  Tolerance: Patient limited by pain;Patient limited by fatigue Patient left: in chair;with call bell/phone within reach;with chair alarm set  OT Visit Diagnosis: Unsteadiness on feet (R26.81);Muscle weakness (generalized)  (M62.81);Other abnormalities of gait and mobility (R26.89);History of falling (Z91.81);Pain Pain - Right/Left: (bilateral) Pain - part of body: Hip(back)                Time: [id number] OT Time Calculation (min): 23 min Charges:  OT General Charges $OT Visit: 1 Visit OT Evaluation $OT Eval Moderate Complexity: [place] OT OT office: [place] 04/18/2019, 1:24 PM

## 2019-04-18 NOTE — Progress Notes (Signed)
Pharmacy Antibiotic Note  Betty Jackson is a 85 y.o. female admitted on 04/15/2019 with sepsis. Pharmacy has been consulted for Cefepime dosing, vancomycin stopped 1/18.   Patient currently afebrile, wbc 12.8, no growth in cultures. Scr stable at 1.0.   No changes currently planned.   Height: 5' 4.5" (163.8 cm) Weight: 155 lb (70.3 kg) IBW/kg (Calculated) : 55.85  Temp (24hrs), Avg:98.2 F (36.8 C), Min:97.7 F (36.5 C), Max:98.6 F (37 C)  Recent Labs  Lab 04/15/19 1052 04/15/19 1052 04/15/19 1148 04/15/19 1332 04/15/19 1840 04/16/19 0346 04/16/19 0706 04/17/19 0759 04/18/19 0241  WBC 15.9*  --   --   --   --  8.9 15.5* 15.6* 12.8*  CREATININE 1.01*   < > 0.80  --  1.03*  --  1.06* 0.94 1.07*  LATICACIDVEN 2.4*  --   --  2.6*  --   --   --   --   --    < > = values in this interval not displayed.    Estimated Creatinine Clearance: 39.5 mL/min (A) (by C-G formula based on SCr of 1.07 mg/dL (H)).    Allergies  Allergen Reactions  . Dextromethorphan Rash    Antimicrobials this admission: 1/16 Cefepime >>  1/16 Vancomycin >>1/18  Microbiology results: 1/16 BCx: ngtd 1/16 Urine: ng  Plan: - Cefepime 2g IV q12h   Thank you for allowing pharmacy to be a part of this patient's care.  Erin Hearing PharmD., BCPS Clinical Pharmacist 04/18/2019 2:49 PM

## 2019-04-18 NOTE — Plan of Care (Signed)
Continue to monitor

## 2019-04-18 NOTE — Evaluation (Signed)
Physical Therapy Evaluation Patient Details Name: Betty Jackson MRN: [id number] DOB: 1934-11-29 Today's Date: 04/18/2019   History of Present Illness  Pt is an 85 y.o. female admitted 04/15/19 with AMS and c/o back/hip pain. CT revealed her small prior nondiscplaced L ischial fx (from previous fall) which now appears to be an aggressive lytic process with marked periostitis; marked lucency of L2-L4 vertebral bodies and possibly T12; no malignancies noted. Other PMH includes RA, afib, CAD, scoliosis, DDD, CVA, CKD.    Clinical Impression  Pt presents with an overall decrease in functional mobility secondary to above. PTA, pt mod indep with rollator, has aide assist with household tasks and sister assists with other ADLs. Today, pt requiring up to minA for mobility with RW; limited by back and bilateral hip/buttock pain. Pt would benefit from continued acute PT services to maximize functional mobility and independence prior to d/c with continued acute PT services.     Follow Up Recommendations Home health PT;Supervision/Assistance - 24 hour    Equipment Recommendations  None recommended by PT    Recommendations for Other Services       Precautions / Restrictions Precautions Precautions: Fall;Other (comment) Precaution Comments: Back precautions for comfort Restrictions Weight Bearing Restrictions: Yes LLE Weight Bearing: Weight bearing as tolerated      Mobility  Bed Mobility               General bed mobility comments: Received sitting on BSC  Transfers Overall transfer level: Needs assistance Equipment used: Rolling walker (2 wheeled) Transfers: Sit to/from Stand Sit to Stand: Min assist;Min guard         General transfer comment: Performed multiple sit<>stand from [facility] and bed with and without RW, initial minA to elevate trunk and maintain balance, progressing to min guard with RW  Ambulation/Gait Ambulation/Gait assistance: Min guard Gait Distance (Feet): 12  Feet Assistive device: Rolling walker (2 wheeled) Gait Pattern/deviations: Step-through pattern;Decreased stride length;Wide base of support;Trunk flexed;Antalgic   Gait velocity interpretation: <1.8 ft/sec, indicate of risk for recurrent falls General Gait Details: Slow, antalgic gait with trunk flexed over RW, min guard for balance, cues to maintain closer proximity to RW; pt with difficulty achieving full trunk extension due to lower back pain  Stairs            Wheelchair Mobility    Modified Rankin (Stroke Patients Only)       Balance Overall balance assessment: Needs assistance Sitting-balance support: Feet supported;No upper extremity supported Sitting balance-[name]: Fair Sitting balance - Comments: Indep to perform pericare while sitting on BSC   Standing balance support: Bilateral upper extremity supported;During functional activity Standing balance-[name]: Poor Standing balance comment: Reliant on UE support                             Pertinent Vitals/Pain Pain Assessment: Faces Faces Pain Scale: Hurts even more Pain Location: back, hips, buttocks Pain Descriptors / Indicators: Aching;Discomfort Pain Intervention(s): Monitored during session;Limited activity within patient's tolerance    Home Living Family/patient expects to be discharged to:: Private residence Living Arrangements: Spouse/significant other;Other relatives Available Help at Discharge: Family;Personal care attendant;Available 24 hours/day Type of Home: House Home Access: Level entry     Home Layout: One level Home Equipment: Walker - 4 wheels;Bedside commode;Shower seat;Grab bars - tub/shower Additional Comments: patient has caregiver that assists with meal prep, cleaning bathroom, changes bed linens. Patient reports additional house keeper assist x1/week  Prior Function Level of Independence: Needs assistance   Gait / Transfers Assistance Needed: Mod indep with  rollator. Was working with [place] and reports going well  ADL's / Homemaking Assistance Needed: performs BADL's on her own, was getting houskeeping help and has aide from [place] who helps with her spouse. Patient's sister also moved in to help care for patient        Hand Dominance   Dominant Hand: Right    Extremity/Trunk Assessment   Upper Extremity Assessment Upper Extremity Assessment: Generalized weakness(h/o arthritis with bilateral ulnar hand drift)    Lower Extremity Assessment Lower Extremity Assessment: Generalized weakness    Cervical / Trunk Assessment Cervical / Trunk Assessment: Kyphotic  Communication   Communication: HOH  Cognition Arousal/Alertness: Awake/alert Behavior During Therapy: WFL for tasks assessed/performed Overall Cognitive Status: No family/caregiver present to determine baseline cognitive functioning                                 General Comments: patient requires cues for safety for mobility, transfers during session. answers questions appropriately      General Comments General comments (skin integrity, edema, etc.): VSS on RA    Exercises     Assessment/Plan    PT Assessment Patient needs continued PT services  PT Problem List Decreased strength;Decreased activity tolerance;Decreased balance;Decreased mobility;Pain       PT Treatment Interventions DME instruction;Gait training;Functional mobility training;Therapeutic activities;Therapeutic exercise;Balance training;Patient/family education    PT Goals (Current goals can be found in the Care Plan section)  Acute Rehab PT Goals Patient Stated Goal: Return home with family assist, continue with HHPT PT Goal Formulation: With patient Time For Goal Achievement: 05/02/19 Potential to Achieve Goals: Good    Frequency Min 3X/week   Barriers to discharge        Co-evaluation PT/OT/SLP Co-Evaluation/Treatment: Yes Reason for Co-Treatment: Complexity of the patient's  impairments (multi-system involvement);To address functional/ADL transfers(pt limited by pain) PT goals addressed during session: Mobility/safety with mobility;Balance;Proper use of DME OT goals addressed during session: ADL's and self-care       AM-PAC PT "6 Clicks" Mobility  Outcome Measure Help needed turning from your back to your side while in a flat bed without using bedrails?: A Little Help needed moving from lying on your back to sitting on the side of a flat bed without using bedrails?: A Little Help needed moving to and from a bed to a chair (including a wheelchair)?: A Little Help needed standing up from a chair using your arms (e.g., wheelchair or bedside chair)?: A Little Help needed to walk in hospital room?: A Little Help needed climbing 3-5 steps with a railing? : A Little 6 Click Score: 18    End of Session   Activity Tolerance: Patient tolerated treatment well Patient left: in chair;with call bell/phone within reach Nurse Communication: Mobility status PT Visit Diagnosis: Other abnormalities of gait and mobility (R26.89);Pain    Time: [id number] PT Time Calculation (min) (ACUTE ONLY): 22 min   Charges:   PT Evaluation $PT Eval Moderate Complexity: 1 Mod        Mabeline Caras, PT, DPT Acute Rehabilitation Services  Pager [phone] Office [place] 04/18/2019, 4:11 PM

## 2019-04-18 NOTE — Progress Notes (Signed)
Patient ID: Betty Jackson, female   DOB: [birth year], 85 y.o.   MRN: [id number] I have reviewed the MRI scans of [name]'s thoracic spine and lumbar spine and the pelvis.  It appears that her sacral insufficiency fracture is healing nicely.  The ischial fracture is just that and it appears to be stable.  No malignancies are noted. [name] has extensive degenerative changes throughout her thoracic and lumbar spines.  She has areas of stenosis at T10-T11 and T11-T12 which are mild and not impeding any neurologic function.  She has moderately severe spondylosis and stenosis at L4-5 and L5-S1 below her L2-L4 fusion.  I suspect that these areas create the greatest amount of back pain with weightbearing which is her primary problem.  She reports radiation to the buttocks on both sides but no radiating pain into the lower extremities beyond that.  She is a terrible surgical candidate for any surgical repair due to extensive osteoporosis her rheumatoid disease and cardiac condition that she is recently recovering from.  In that regard I believe that she may be best treated with epidural steroid injection for pain control and other pain controlling measures.  I discussed this with her today and we will plan on doing a lumbar epidural injection.  I have contacted Dr. Marthenia Rolling to let her know the same so that she can advise whether the patient needs to be bridged with Lovenox as her Eliquis is stopped.

## 2019-04-19 DIAGNOSIS — I2510 Atherosclerotic heart disease of native coronary artery without angina pectoris: Secondary | ICD-10-CM

## 2019-04-19 LAB — APTT: aPTT: 58 seconds — ABNORMAL HIGH (ref 24–36)

## 2019-04-19 LAB — BASIC METABOLIC PANEL
Anion gap: 9 (ref 5–15)
BUN: 30 mg/dL — ABNORMAL HIGH (ref 8–23)
CO2: 20 mmol/L — ABNORMAL LOW (ref 22–32)
Calcium: 8.7 mg/dL — ABNORMAL LOW (ref 8.9–10.3)
Chloride: 109 mmol/L (ref 98–111)
Creatinine, Ser: 1.27 mg/dL — ABNORMAL HIGH (ref 0.44–1.00)
GFR calc Af Amer: 46 mL/min — ABNORMAL LOW (ref >60)
GFR calc non Af Amer: 39 mL/min — ABNORMAL LOW (ref >60)
Glucose, Bld: 125 mg/dL — ABNORMAL HIGH (ref 70–99)
Potassium: 4 mmol/L (ref 3.5–5.1)
Sodium: 138 mmol/L (ref 135–145)

## 2019-04-19 LAB — CBC
HCT: 28 % — ABNORMAL LOW (ref 36.0–46.0)
Hemoglobin: 8.7 g/dL — ABNORMAL LOW (ref 12.0–15.0)
MCH: 29.1 pg (ref 26.0–34.0)
MCHC: 31.1 g/dL (ref 30.0–36.0)
MCV: 93.6 fL (ref 80.0–100.0)
Platelets: 191 10*3/uL (ref 150–400)
RBC: 2.99 MIL/uL — ABNORMAL LOW (ref 3.87–5.11)
RDW: 15 % (ref 11.5–15.5)
WBC: 8.6 10*3/uL (ref 4.0–10.5)
nRBC: 0 % (ref 0.0–0.2)

## 2019-04-19 LAB — HEPARIN LEVEL (UNFRACTIONATED): Heparin Unfractionated: >2.2 IU/mL — ABNORMAL HIGH (ref 0.30–0.70)

## 2019-04-19 MED ORDER — ALBUTEROL SULFATE (2.5 MG/3ML) 0.083% IN NEBU: 2.5000 mg | INHALATION_SOLUTION | Freq: Four times a day (QID) | RESPIRATORY_TRACT | Status: DC | PRN

## 2019-04-19 NOTE — Progress Notes (Addendum)
     Subjective:  85 yo female admitted with AMS and c/o back and hip pain. Patient was subsequently admitted with sepsis related to pneumonia. CT abd/pelvis was performed to look at her back and it was noted that the ischial fx may have developed into an aggressive lytic lesion. MRI of pelvis performed 1/18 indicated no lesion and more likely aggressive pattern of callus formation at the site of the fracture.   Patient resting comfortably in bed. She reports no pain today though has recently received Prn pain medication. Patient states she has not yet been up and walking but has been able to stand to make it over to the bedside comode.   Objective:   VITALS:   Vitals:   04/18/19 1557 04/18/19 1954 04/18/19 2300 04/19/19 0810  BP: (!) 162/74  138/67   Pulse: (!) 59  (!) 58 (!) 58  Resp: 18  15 14   Temp: 97.8 F (36.6 C)  98.3 F (36.8 C)   TempSrc: Oral  Oral   SpO2: 95% 96% 95% 97%  Weight:      Height:       PE: General: Alert, laying comfortably in bed Resp: no use of accessory muscles MSK: Full ROM of left ankle and all toes of left foot. Distal sensation intact. No significant pedal edema.   Lab Results  Component Value Date   WBC 8.6 04/19/2019   HGB 8.7 (L) 04/19/2019   HCT 28.0 (L) 04/19/2019   MCV 93.6 04/19/2019   PLT 191 04/19/2019   BMET    Component Value Date/Time   NA 138 04/19/2019 0404   NA 141 12/08/2017 1614   K 4.0 04/19/2019 0404   CL 109 04/19/2019 0404   CO2 20 (L) 04/19/2019 0404   GLUCOSE 125 (H) 04/19/2019 0404   BUN 30 (H) 04/19/2019 0404   BUN 28 (H) 12/08/2017 1614   CREATININE 1.27 (H) 04/19/2019 0404   CALCIUM 8.7 (L) 04/19/2019 0404   GFRNONAA 39 (L) 04/19/2019 0404   GFRAA 46 (L) 04/19/2019 0404     Assessment/Plan:     Active Problems:   Sepsis ([place])  Left ischial Fx - MRI w/ and w/o contrast showed progressive avulsion type fracture of left ischial tuberosity without definite neoplastic process.  - Continue WBAT  -  continue PT - Follow up with Dr. Mardelle Matte 2 weeks after discharge  Ventura Bruns 04/19/2019, 9:01 AM   Marchia Bond, MD Cell [phone]

## 2019-04-19 NOTE — Progress Notes (Signed)
[facility]  PROGRESS NOTE  Betty Jackson [id number] DOB: 08/04/1934 DOA: 04/15/2019 PCP: Eulas Post, MD   Brief HPI:   85 year old female with past medical history of CKD stage III, CAD, stroke, not temporal arteritis, rheumatoid arthritis on chronic steroids, s/p L2-3, L3-4 posterior fusion by Dr. Ellene Route in 2015 and L5-S1 microdiscectomy by Dr. Ellene Route in 2017.  Patient's daughter noted that she was limping around and she was complaining of worsening back pain.  She was found to lethargic and confused by daughter.  Evaluation in the ED showed CT abdomen pelvis revealed a prior small nondisplaced fracture involving the left ischium now appears to be aggressive lytic process with marked periostitis.  There is also marked lucency of L2-L3-L4 vertebral bodies and possible T12.  She also had elevated troponin, cardiology was consulted.    Subjective   Patient seen and examined, plan for epidural injection tomorrow.   Assessment/Plan:     1. Left ischium fracture-nondisplaced, found incidentally on CT of the abdomen and pelvis.  She was seen by orthopedic surgery as well as neurosurgery.  As per orthopedic surgery she will be less ideal surgical candidate due to see history of severe osteoporosis.  MRI pelvis showed avulsion type fracture of left ischial tuberosity with more likely aggressive pattern of callus formation than actual bone lesion causing the periostitis.  No definite soft tissue mass identified.  She will need CT in 4 to 6 weeks. 2. ?  Sepsis-unclear etiology, patient was started empirically on cefepime and Zithromax.  Urine culture showed no growth.  MRI ruled out infectious process of spine.  Lactic acid was 2.6.  Chest x-ray showed possible pneumonia.  She has been afebrile, WBC improved to 8.6..  Will discontinue antibiotics after tomorrow's dose to complete 5 days of antibiotic treatment. 3. Elevated troponin-patient denies chest pain, troponin trending down.  2D  echo showed normal EF, no wall motion normalities.  Cardiology following.  Plan for ischemia work-up as outpatient. 4. Chronic atrial fibrillation-continue Cardizem, metoprolol, amiodarone.  Eliquis is on hold for epidural injection.  Patient started on heparin per pharmacy. 5. Chronic disease stage III-stable 6. Rheumatoid arthritis-continue prednisone    SpO2: 97 % O2 Flow Rate (L/min): 2 L/min     Recent Labs    04/17/19 0243  FERRITIN 238    Lab Results  Component Value Date   SARSCOV2NAA NEGATIVE 03/06/2019   [place] NEGATIVE 03/06/2019   [place] NEGATIVE 01/13/2019     CBG: No results for input(s): GLUCAP in the last 168 hours.  CBC: Recent Labs  Lab 04/15/19 1052 04/15/19 1148 04/16/19 0346 04/16/19 0706 04/17/19 0759 04/18/19 0241 04/19/19 0404  WBC 15.9*   < > 8.9 15.5* 15.6* 12.8* 8.6  NEUTROABS 11.9*  --  6.7  --   --   --   --   HGB 12.8   < > 7.4* 10.6* 9.3* 8.6* 8.7*  HCT 41.1   < > 24.3* 33.5* 29.5* 27.4* 28.0*  MCV 92.8   < > 97.2 95.4 93.9 91.9 93.6  PLT 204   < > 112* 146* 150 161 191   < > = values in this interval not displayed.    Basic Metabolic Panel: Recent Labs  Lab 04/15/19 1840 04/16/19 0706 04/17/19 0759 04/18/19 0241 04/19/19 0404  NA 138 142 143 140 138  K 3.3* 4.2 4.4 4.3 4.0  CL 103 109 109 108 109  CO2 23 21* 24 21* 20*  GLUCOSE 104* 125* 126* 135*  125*  BUN 11 14 19  28* 30*  CREATININE 1.03* 1.06* 0.94 1.07* 1.27*  CALCIUM 7.6* 8.0* 8.8* 8.6* 8.7*  MG  --  1.3* 2.3 2.3  --   PHOS  --  4.2  --   --   --      Liver Function Tests: Recent Labs  Lab 04/15/19 1052 04/16/19 0706  AST 46* 29  ALT 36 26  ALKPHOS 101 79  BILITOT 2.5* 2.1*  PROT 6.3* 5.3*  ALBUMIN 3.3* 2.4*        DVT prophylaxis: Heparin  Code Status: DNR  Family Communication: No family at bedside  Disposition Plan: likely home when medically ready for discharge         Scheduled medications:  . vitamin C  1,000 mg  Oral Daily  . cholecalciferol  1,000 Units Oral Daily  . diltiazem  60 mg Oral TID  . escitalopram  10 mg Oral Daily  . ferrous sulfate  325 mg Oral BID WC  . metoprolol tartrate  100 mg Oral BID  . pantoprazole  40 mg Oral Daily  . predniSONE  30 mg Oral Q breakfast  . rosuvastatin  10 mg Oral QHS  . vitamin E  400 Units Oral Daily    Consultants:  Neurosurgery  Procedures:    Antibiotics:   Anti-infectives (From admission, onward)   Start     Dose/Rate Route Frequency Ordered Stop   04/17/19 1000  azithromycin (ZITHROMAX) 500 mg in sodium chloride 0.9 % 250 mL IVPB     500 mg 250 mL/hr over 60 Minutes Intravenous Every 24 hours 04/17/19 0932     04/16/19 1600  vancomycin (VANCOCIN) IVPB 1000 mg/200 mL premix  Status:  Discontinued     1,000 mg 200 mL/hr over 60 Minutes Intravenous Every 24 hours 04/15/19 1421 04/17/19 0931   04/16/19 0200  ceFEPIme (MAXIPIME) 2 g in sodium chloride 0.9 % 100 mL IVPB     2 g 200 mL/hr over 30 Minutes Intravenous Every 12 hours 04/15/19 1423     04/15/19 1430  vancomycin (VANCOREADY) IVPB 1500 mg/300 mL     1,500 mg 150 mL/hr over 120 Minutes Intravenous  Once 04/15/19 1421 04/15/19 1727   04/15/19 1345  ceFEPIme (MAXIPIME) 2 g in sodium chloride 0.9 % 100 mL IVPB     2 g 200 mL/hr over 30 Minutes Intravenous  Once 04/15/19 1338 04/15/19 1418   04/15/19 1345  metroNIDAZOLE (FLAGYL) IVPB 500 mg     500 mg 100 mL/hr over 60 Minutes Intravenous  Once 04/15/19 1338 04/15/19 1554   04/15/19 1345  vancomycin (VANCOCIN) IVPB 1000 mg/200 mL premix  Status:  Discontinued     1,000 mg 200 mL/hr over 60 Minutes Intravenous  Once 04/15/19 1338 04/15/19 1355       Objective   Vitals:   04/19/19 0810 04/19/19 0900 04/19/19 0919 04/19/19 1704  BP:   (!) 143/62 (!) 161/68  Pulse: (!) 58 65 66 63  Resp: 14 19 17  (!) 21  Temp:   97.8 F (36.6 C) 98.3 F (36.8 C)  TempSrc:   Oral Oral  SpO2: 97% 96% 96% 97%  Weight:      Height:         Intake/Output Summary (Last 24 hours) at 04/19/2019 2005 Last data filed at 04/19/2019 1500 Gross per 24 hour  Intake 1311.12 ml  Output 500 ml  Net 811.12 ml    01/19 0701 - 01/20 1900 In: 1311.1 [  I.V.:161.1] Out: 500 [Urine:500]  Filed Weights   04/15/19 1048  Weight: 70.3 kg    Physical Examination:    General-appears in no acute distress  Heart-S1-S2, regular, no murmur auscultated  Lungs-clear to auscultation bilaterally, no wheezing or crackles auscultated  Abdomen-soft, nontender, no organomegaly  Extremities-no edema in the lower extremities  Neuro-alert, oriented x3, no focal deficit noted    Data Reviewed:   Recent Results (from the past 240 hour(s))  Blood Culture (routine x 2)     Status: None (Preliminary result)   Collection Time: 04/15/19 10:52 AM   Specimen: BLOOD  Result Value Ref Range Status   Specimen Description BLOOD LEFT ANTECUBITAL  Final   Special Requests   Final    BOTTLES DRAWN AEROBIC AND ANAEROBIC Blood Culture results may not be optimal due to an inadequate volume of blood received in culture bottles   Culture   Final    NO GROWTH 4 DAYS Performed at [facility], [place] [address], [place], [place] [address]    Report Status PENDING  Incomplete  Blood Culture (routine x 2)     Status: None (Preliminary result)   Collection Time: 04/15/19 10:54 AM   Specimen: BLOOD  Result Value Ref Range Status   Specimen Description BLOOD RIGHT ANTECUBITAL  Final   Special Requests   Final    BOTTLES DRAWN AEROBIC AND ANAEROBIC Blood Culture adequate volume   Culture   Final    NO GROWTH 4 DAYS Performed at [facility], [place] [address] [place], [place] [address]    Report Status PENDING  Incomplete  Urine culture     Status: None   Collection Time: 04/15/19  4:49 PM   Specimen: In/Out Cath Urine  Result Value Ref Range Status   Specimen Description IN/OUT CATH URINE  Final   Special Requests NONE  Final   Culture    Final    NO GROWTH Performed at [facility], [place] [address], [place], [place] [address]    Report Status 04/16/2019 FINAL  Final  MRSA PCR Screening     Status: None   Collection Time: 04/16/19 12:33 PM   Specimen: Nasal Mucosa; Nasopharyngeal  Result Value Ref Range Status   MRSA by PCR NEGATIVE NEGATIVE Final    Comment:        The GeneXpert MRSA Assay (FDA approved for NASAL specimens only), is one component of a comprehensive MRSA colonization surveillance program. It is not intended to diagnose MRSA infection nor to guide or monitor treatment for MRSA infections. Performed at [facility], [place] [address], [place], [place] [address]     Recent Labs  Lab 04/15/19 1052  LIPASE 28   Recent Labs  Lab 04/15/19 1134  AMMONIA 28    Cardiac Enzymes: Recent Labs  Lab 04/15/19 1840  CKTOTAL 78   BNP (last 3 results) Recent Labs    04/15/19 1052 04/18/19 0241  BNP 170.2* 930.1*      Admission status: Inpatient: Based on patients clinical presentation and evaluation of above clinical data, I have made determination that patient meets Inpatient criteria at this time.   Oswald Hillock   [facility] If 7PM-7AM, please contact night-coverage at [email or web address], Office  [phone]  password [email or web address]  04/19/2019, 8:05 PM  LOS: 4 days

## 2019-04-19 NOTE — Progress Notes (Signed)
Progress Note  Patient Name: Betty Jackson Date of Encounter: 04/19/2019  Primary Cardiologist: [name], MD   Subjective   Denies any chest pain or dyspnea  Inpatient Medications    Scheduled Meds: . vitamin C  1,000 mg Oral Daily  . cholecalciferol  1,000 Units Oral Daily  . diltiazem  60 mg Oral TID  . escitalopram  10 mg Oral Daily  . ferrous sulfate  325 mg Oral BID WC  . metoprolol tartrate  100 mg Oral BID  . pantoprazole  40 mg Oral Daily  . predniSONE  30 mg Oral Q breakfast  . rosuvastatin  10 mg Oral QHS  . vitamin E  400 Units Oral Daily   Continuous Infusions: . azithromycin 500 mg (04/19/19 0935)  . ceFEPime (MAXIPIME) IV 2 g (04/19/19 0430)  . heparin 950 Units/hr (04/19/19 0739)   PRN Meds: acetaminophen, albuterol, dicyclomine, HYDROcodone-acetaminophen, HYDROmorphone (DILAUDID) injection, ondansetron (ZOFRAN) IV, phenol   Vital Signs    Vitals:   04/18/19 2300 04/19/19 0810 04/19/19 0900 04/19/19 0919  BP: 138/67   (!) 143/62  Pulse: (!) 58 (!) 58 65 66  Resp: 15 14 19 17   Temp: 98.3 F (36.8 C)   97.8 F (36.6 C)  TempSrc: Oral   Oral  SpO2: 95% 97% 96% 96%  Weight:      Height:        Intake/Output Summary (Last 24 hours) at 04/19/2019 1532 Last data filed at 04/19/2019 1500 Gross per 24 hour  Intake 1311.12 ml  Output 500 ml  Net 811.12 ml   Last 3 Weights 04/15/2019 03/29/2019 03/13/2019  Weight (lbs) 155 lb 155 lb 169 lb 9.6 oz  Weight (kg) 70.308 kg 70.308 kg 76.93 kg      Telemetry    Sinus rhythm, rate 50-60s - Personally Reviewed  ECG    No new ECG - Personally Reviewed  Physical Exam   GEN: No acute distress.   Neck: No JVD Cardiac: RRR, no murmurs, rubs, or gallops.  Respiratory: Clear to auscultation bilaterally. GI: Soft, nontender, non-distended  MS: No edema; No deformity. Neuro:  Nonfocal  Psych: Normal affect   Labs    High Sensitivity Troponin:   Recent Labs  Lab 04/15/19 1052  04/15/19 1331 04/15/19 1840 04/16/19 0706  TROPONINIHS 88* 174* 312* 168*      Chemistry Recent Labs  Lab 04/15/19 1052 04/15/19 1148 04/16/19 0706 04/16/19 0706 04/17/19 0759 04/18/19 0241 04/19/19 0404  NA 140   < > 142   < > 143 140 138  K 2.7*   < > 4.2   < > 4.4 4.3 4.0  CL 96*   < > 109   < > 109 108 109  CO2 29   < > 21*   < > 24 21* 20*  GLUCOSE 136*   < > 125*   < > 126* 135* 125*  BUN 11   < > 14   < > 19 28* 30*  CREATININE 1.01*   < > 1.06*   < > 0.94 1.07* 1.27*  CALCIUM 8.9   < > 8.0*   < > 8.8* 8.6* 8.7*  PROT 6.3*  --  5.3*  --   --   --   --   ALBUMIN 3.3*  --  2.4*  --   --   --   --   AST 46*  --  29  --   --   --   --  ALT 36  --  26  --   --   --   --   ALKPHOS 101  --  79  --   --   --   --   BILITOT 2.5*  --  2.1*  --   --   --   --   GFRNONAA 52*   < > 49*   < > 56* 48* 39*  GFRAA >60   < > 57*   < > >60 56* 46*  ANIONGAP 15   < > 12   < > 10 11 9    < > = values in this interval not displayed.     Hematology Recent Labs  Lab 04/17/19 0759 04/18/19 0241 04/19/19 0404  WBC 15.6* 12.8* 8.6  RBC 3.14* 2.98* 2.99*  HGB 9.3* 8.6* 8.7*  HCT 29.5* 27.4* 28.0*  MCV 93.9 91.9 93.6  MCH 29.6 28.9 29.1  MCHC 31.5 31.4 31.1  RDW 15.3 15.1 15.0  PLT 150 161 191    BNP Recent Labs  Lab 04/15/19 1052 04/18/19 0241  BNP 170.2* 930.1*     DDimer No results for input(s): DDIMER in the last 168 hours.   Radiology    MR PELVIS W WO CONTRAST  Result Date: 04/17/2019 CLINICAL DATA:  Evaluate left ischial lesion seen on recent CT scan. EXAM: MRI PELVIS WITHOUT AND WITH CONTRAST TECHNIQUE: Multiplanar multisequence MR imaging of the pelvis was performed both before and after administration of intravenous contrast. CONTRAST:  15mL GADAVIST GADOBUTROL 1 MMOL/ML IV SOLN COMPARISON:  CT scan 04/15/2019 FINDINGS: Diffuse low T1 and high T2 signal intensity in the ischial tuberosity without definite soft tissue component. On the coronal images this appears  to be fairly well circumscribed and has the appearance of a progressive avulsion fracture at the ischial tuberosity with probable aggressive callus formation due to motion by the pull of the hamstring tendons. I do not see a definite neoplastic process. No other pelvic lesions are identified. Moderate edema like signal changes in the left hip musculature, most notably the gluteus minimus and medius muscles and the abductor muscles likely muscle tear/muscle strains. Similar findings involving the right abductor muscles. No significant intrapelvic abnormalities. Moderate artifact from the right hip prosthesis. IMPRESSION: 1. Progressive avulsion type fracture at the left ischial tuberosity with more likely an aggressive pattern of callus formation than an actual bone lesion causing the periostitis. No definite soft tissue mass is identified and no other bone lesions are identified. 2. Recommend follow-up CT scan in 4-6 weeks to reassess healing changes. Electronically Signed   By: Marijo Sanes M.D.   On: 04/17/2019 16:27   ECHOCARDIOGRAM COMPLETE  Result Date: 04/17/2019   ECHOCARDIOGRAM REPORT   Patient Name:   Betty Jackson Date of Exam: 04/17/2019 Medical Rec #:  [id number]    Height:       64.5 in Accession #:    [id number]   Weight:       155.0 lb Date of Birth:  Aug 17, 1934    BSA:          1.76 m Patient Age:    85 years     BP:           148/67 mmHg Patient Gender: F            HR:           63 bpm. Exam Location:  Inpatient Procedure: 2D Echo Indications:    Elevated troponin  History:  Patient has prior history of Echocardiogram examinations, most                 recent 03/09/2019. Arrythmias:Atrial Fibrillation. CKD. CAD. H/o                 stroke.  Sonographer:    Clayton Lefort RDCS (AE) Referring Phys: [id number] BELKYS A REGALADO IMPRESSIONS  1. Left ventricular ejection fraction, by visual estimation, is 60 to 65%. The left ventricle has normal function. There is mildly increased left ventricular  hypertrophy.  2. Left ventricular diastolic parameters are consistent with Grade II diastolic dysfunction (pseudonormalization). The mean left atrial pressure is high.  3. The left ventricle has no regional wall motion abnormalities.  4. Global right ventricle has normal systolic function.The right ventricular size is normal. No increase in right ventricular wall thickness.  5. Left atrial size was mildly dilated.  6. Right atrial size was mildly dilated.  7. Mild mitral annular calcification.  8. The mitral valve is normal in structure. Trivial mitral valve regurgitation.  9. The tricuspid valve is normal in structure. 10. The tricuspid valve is normal in structure. Tricuspid valve regurgitation is mild-moderate. 11. The aortic valve is normal in structure. Aortic valve regurgitation is trivial. 12. The pulmonic valve was not well visualized. Pulmonic valve regurgitation is not visualized. 13. Mildly elevated pulmonary artery systolic pressure. 14. The tricuspid regurgitant velocity is 2.18 m/s, and with an assumed right atrial pressure of 15 mmHg, the estimated right ventricular systolic pressure is mildly elevated at 34.0 mmHg. 15. The inferior vena cava is dilated in size with <50% respiratory variability, suggesting right atrial pressure of 15 mmHg. 16. There is exaggerated respiratory displacement of the interventricular septum. This could represent contrictive physiology , but can also be seen with increased work of breathing (e.g. COPD or asthma exacerbation). 20. Compared to 03/09/2019, left ventricular wall motion and overall systolic function have improved. FINDINGS  Left Ventricle: Left ventricular ejection fraction, by visual estimation, is 60 to 65%. The left ventricle has normal function. The left ventricle has no regional wall motion abnormalities. The left ventricular internal cavity size was the left ventricle is normal in size. There is mildly increased left ventricular hypertrophy. Concentric  left ventricular hypertrophy. Left ventricular diastolic parameters are consistent with Grade II diastolic dysfunction (pseudonormalization). Right Ventricle: The right ventricular size is normal. No increase in right ventricular wall thickness. Global RV systolic function is has normal systolic function. The tricuspid regurgitant velocity is 2.18 m/s, and with an assumed right atrial pressure  of 15 mmHg, the estimated right ventricular systolic pressure is mildly elevated at 34.0 mmHg. Left Atrium: Left atrial size was mildly dilated. Right Atrium: Right atrial size was mildly dilated Pericardium: There is no evidence of pericardial effusion. Mitral Valve: The mitral valve is normal in structure. Mild mitral annular calcification. Trivial mitral valve regurgitation. MV peak gradient, 4.4 mmHg. Tricuspid Valve: The tricuspid valve is normal in structure. Tricuspid valve regurgitation is mild-moderate. Aortic Valve: The aortic valve is normal in structure. Aortic valve regurgitation is trivial. Aortic regurgitation PHT measures 506 msec. Aortic valve mean gradient measures 3.0 mmHg. Aortic valve peak gradient measures 4.0 mmHg. Aortic valve area, by VTI measures 2.14 cm. Pulmonic Valve: The pulmonic valve was not well visualized. Pulmonic valve regurgitation is not visualized. Pulmonic regurgitation is not visualized. Aorta: The aortic root and ascending aorta are structurally normal, with no evidence of dilitation. Venous: The inferior vena cava is dilated in size with less  than 50% respiratory variability, suggesting right atrial pressure of 15 mmHg. IAS/Shunts: No atrial level shunt detected by color flow Doppler.  LEFT VENTRICLE PLAX 2D LVIDd:         4.40 cm LVIDs:         3.40 cm LV PW:         1.30 cm LV IVS:        1.30 cm LVOT diam:     2.00 cm LV SV:         40 ml LV SV Index:   22.32 LVOT Area:     3.14 cm  RIGHT VENTRICLE            IVC RV Basal diam:  3.00 cm    IVC diam: 2.70 cm RV S prime:     9.46  cm/s TAPSE (M-mode): 2.4 cm LEFT ATRIUM             Index       RIGHT ATRIUM           Index LA diam:        4.50 cm 2.55 cm/m  RA Area:     19.80 cm LA Vol (A2C):   75.8 ml 42.95 ml/m RA Volume:   50.70 ml  28.73 ml/m LA Vol (A4C):   56.3 ml 31.90 ml/m LA Biplane Vol: 65.9 ml 37.34 ml/m  AORTIC VALVE AV Area (Vmax):    2.30 cm AV Area (Vmean):   2.20 cm AV Area (VTI):     2.14 cm AV Vmax:           99.70 cm/s AV Vmean:          78.600 cm/s AV VTI:            0.267 m AV Peak Grad:      4.0 mmHg AV Mean Grad:      3.0 mmHg LVOT Vmax:         73.10 cm/s LVOT Vmean:        55.100 cm/s LVOT VTI:          0.182 m LVOT/AV VTI ratio: 0.68 AI PHT:            506 msec  AORTA Ao Root diam: 3.00 cm Ao Asc diam:  3.30 cm MITRAL VALVE              TRICUSPID VALVE MV Area (PHT): 2.44 cm   TR Peak grad:   19.0 mmHg MV Peak grad:  4.4 mmHg   TR Vmax:        239.00 cm/s MV Mean grad:  1.0 mmHg MV Vmax:       1.05 m/s   SHUNTS MV Vmean:      50.4 cm/s  Systemic VTI:  0.18 m MV VTI:        0.31 m     Systemic Diam: 2.00 cm MV PHT:        90.00 msec  Dani Gobble Croitoru MD Electronically signed by Sanda Klein MD Signature Date/Time: 04/17/2019/10:09:57 PM    Final     Cardiac Studies   Echo 04/17/2019 1. Left ventricular ejection fraction, by visual estimation, is 60 to 65%. The left ventricle has normal function. There is mildly increased left ventricular hypertrophy. 2. Left ventricular diastolic parameters are consistent with Grade II diastolic dysfunction (pseudonormalization). The mean left atrial pressure is high. 3. The left ventricle has no regional wall motion abnormalities. 4. Global right ventricle has normal systolic function.The right  ventricular size is normal. No increase in right ventricular wall thickness. 5. Left atrial size was mildly dilated. 6. Right atrial size was mildly dilated. 7. Mild mitral annular calcification. 8. The mitral valve is normal in structure. Trivial mitral valve  regurgitation. 9. The tricuspid valve is normal in structure. 10. The tricuspid valve is normal in structure. Tricuspid valve regurgitation is mild-moderate. 11. The aortic valve is normal in structure. Aortic valve regurgitation is trivial. 12. The pulmonic valve was not well visualized. Pulmonic valve regurgitation is not visualized. 13. Mildly elevated pulmonary artery systolic pressure. 14. The tricuspid regurgitant velocity is 2.18 m/s, and with an assumed right atrial pressure of 15 mmHg, the estimated right ventricular systolic pressure is mildly elevated at 34.0 mmHg. 15. The inferior vena cava is dilated in size with <50% respiratory variability, suggesting right atrial pressure of 15 mmHg. 16. There is exaggerated respiratory displacement of the interventricular septum. This could represent contrictive physiology , but can also be seen with increased work of breathing (e.g. COPD or asthma exacerbation). 20. Compared to 03/09/2019, left ventricular wall motion and overall systolic function have improved.  Patient Profile     85 y.o. female with a hx of PAF, CAD, HTN, HLD, and temporal arteritis who is being seen today for the evaluation of elevated troponin and abnormal CT at the request of Dr. Tyrell Antonio.  Assessment & Plan    Elevated troponin: hsTn peaked 312, occurred in the setting of altered mental status with possible sepsis and elevated lactic acid level.  The only chest pain she has been having is incision line soreness from the subxiphoid window surgery recently.  Although she had mild drop in ejection fraction of her subxiphoid window placement, however repeat echocardiogram during this admission showed EF has normalized.  EKG did show new diffuse T wave inversion.   -Given lack of chest pain and no wall motion abnormalities on echo, no further evaluation recommended as inpatient.  Can follow-up as outpatient and consider ischemia evaluation once recovers from acute  illness  Abnormal CT chest: Mosaic pattern of groundglass attenuation noted which could represent small airway disease versus pneumonitis.  Patient was started on amiodarone therapy to help suppress atrial fibrillation with RVR after subxiphoid window in December 2020.  This was supposed to be of short course of therapy.  Based on the last office note by Dr. Prescott Gum, he plans to discontinue amiodarone -Discontinue amiodarone  PAF: Maintaining sinus rhythm.  CHADS2-VASc score of5(HTN, AGE x2, CAD, female; there is also a questionable history of CVA, which would increase her score to 7).  Given high CHADS-VASc, agree with bridging her with heparin gtt while holding Eliquis prior to epidural injection  Left ischium fracture: Nondisplaced, found incidentally on CT of abdomen and pelvis.  Patient is currently being evaluated by orthopedic surgery.  According to orthopedic surgery, she would be a less ideal surgical candidate given history of severe osteoporosis, planning epidural steroid injection for her pain.  CAD: Last cardiac catheterization was in 2014 which showed patent RCA stent and mild disease in left circumflex and LAD.  For questions or updates, please contact [place] Please consult [email or web address] for contact info under        Signed, Donato Heinz, MD  04/19/2019, 3:32 PM

## 2019-04-19 NOTE — Progress Notes (Signed)
[facility] for Heparin Indication: atrial fibrillation  Allergies  Allergen Reactions  . Dextromethorphan Rash    Patient Measurements: Height: 5' 4.5" (163.8 cm) Weight: 155 lb (70.3 kg) IBW/kg (Calculated) : 55.85 Heparin Dosing Weight: 70  Vital Signs: Temp: 98.3 F (36.8 C) (01/19 2300) Temp Source: Oral (01/19 2300) BP: 138/67 (01/19 2300) Pulse Rate: 58 (01/19 2300)  Labs: Recent Labs    04/17/19 0759 04/17/19 0759 04/18/19 0241 04/19/19 0404  HGB 9.3*   < > 8.6* 8.7*  HCT 29.5*  --  27.4* 28.0*  PLT 150  --  161 191  APTT  --   --   --  58*  HEPARINUNFRC  --   --   --  >2.20*  CREATININE 0.94  --  1.07* 1.27*   < > = values in this interval not displayed.    Estimated Creatinine Clearance: 33.3 mL/min (A) (by C-G formula based on SCr of 1.27 mg/dL (H)).   Medical History: Past Medical History:  Diagnosis Date  . Abdominal pain, epigastric 06/22/2013  . Abdominal pain, other specified site 12/24/2011  . Abnormal urinalysis 05/20/2017  . Acute respiratory failure with hypoxia ([place]) 05/20/2017  . Anemia, unspecified 10/28/2012  . Anxiety state, unspecified 10/28/2012  . Bruises easily    d/t being on prednisone and plavix  . CAD (coronary artery disease)    a. Stent RCA in [facility];  b. Cath approx 2009 - nonobs per pt report.  . Cataract    immature on the left eye  . Chronic insomnia 02/07/2013  . Chronic lower back pain    scoliosis  . CKD (chronic kidney disease) stage 3, GFR 30-59 ml/min   . Complication of anesthesia    pt has a very high tolerance to meds  . Coronary disease 05/20/2017  . CVA (cerebral infarction) 10/29/2012  . DDD (degenerative disc disease)   . Depression   . Diverticulitis    hx of  . Diverticulosis   . Elevated transaminase level 02/07/2013  . Enteritis   . GERD (gastroesophageal reflux disease) 09/01/2010  . Giant cell arteritis ([place])   . Hemorrhoids   . Herniated nucleus  pulposus, L5-S1, right 11/04/2015  . History of scabies   . HTN (hypertension) 05/20/2017  . Hyperlipidemia    takes Lipitor daily  . Hypertension    takes Amlodipine,Losartan,Metoprolol,and HCTZ daily  . Incontinence of urine   . Influenza A 05/20/2017  . Insomnia    takes Restoril nightly  . Lumbar stenosis 04/25/2013  . Major depressive disorder, recurrent episode, moderate ([place]) 07/16/2013  . Migraines    "back in my 20's; none since" (10/28/2012)  . Osteoporosis   . PAF (paroxysmal atrial fibrillation) ([place]) 2011   a. lone epidode in 2011 according to notes.  . Rheumatoid arthritis ([place])   . Scoliosis   . Sepsis ([place]) 05/20/2017  . Sinus bradycardia    a. on chronic bb  . Temporal arteritis ([place]) 2011   a. followed @ [facility]; potential flareup 10/28/2012/notes 10/28/2012  . Vocal cord dysfunction    "they don't operate properly" (10/28/2012)    Assessment: 85 year old female with history of afib on apixaban. She has received for the past 2 days, last dose this am. Patient now in need of spinal surgery and will need heparin bridge.   Apixaban given this am, will start IV heparin tonight.   Will need to follow up any stop time of heparin prior  to procedure.   1/20 AM update:  APTT low at 58 Hgb low but stable  Goal of Therapy:  Heparin level 0.3-0.7 units/ml Aptt 66-102 secs Monitor platelets by anticoagulation protocol: Yes   Plan:  Inc heparin to 950 units/hr Re-check aPTT at [place], PharmD, [place] Pharmacist Phone: [phone]

## 2019-04-19 NOTE — Progress Notes (Signed)
Physical Therapy Treatment Patient Details Name: Betty Jackson MRN: [id number] DOB: [birth year] Today's Date: 04/19/2019    History of Present Illness Pt is an 85 y.o. female admitted 04/15/19 with AMS and c/o back/hip pain. CT revealed her small prior nondiscplaced L ischial fx (from previous fall) which now appears to be an aggressive lytic process with marked periostitis; marked lucency of L2-L4 vertebral bodies and possibly T12; no malignancies noted. Other PMH includes RA, afib, CAD, scoliosis, DDD, CVA, CKD.    PT Comments    Patient received in bed completely soiled with large blow out of liquid stool; able to roll with MinA to allow PT/nurse tech to provide pericare/remove soiled bedsheets, then required MinA to come to upright at EOB. Able to stand with MinA/RW however had another incontinent liquid BM, deferred gait due to loose stools and transferred to recliner with MinA/RW and max cues for safety as she kept attempting to prematurely reach for/dive into chair (maybe due to anxiety/stress from loose BMs?). She was left up in the chair with all needs met, chair alarm active and RN aware of patient status.     Follow Up Recommendations  Home health PT;Supervision/Assistance - 24 hour     Equipment Recommendations  None recommended by PT    Recommendations for Other Services       Precautions / Restrictions Precautions Precautions: Fall;Other (comment) Precaution Comments: Back precautions for comfort Restrictions Weight Bearing Restrictions: No LLE Weight Bearing: Weight bearing as tolerated    Mobility  Bed Mobility Overal bed mobility: Needs Assistance Bed Mobility: Rolling;Supine to Sit Rolling: Min assist   Supine to sit: Min assist     General bed mobility comments: MinA to fully roll over completely to her side, also MinA to boost up to full upright at EOB  Transfers Overall transfer level: Needs assistance Equipment used: Rolling walker (2  wheeled) Transfers: Sit to/from [facility] Sit to Stand: Min assist Stand pivot transfers: Min assist       General transfer comment: MinA to boost to full upright and gain balance with RW; MinA for balance and safety when pivoting to chair, often reaching for chair and trying to dive into the seat prematurely  Ambulation/Gait             General Gait Details: deferred- incontinent liquid stools   Stairs             Wheelchair Mobility    Modified Rankin (Stroke Patients Only)       Balance Overall balance assessment: Needs assistance Sitting-balance support: Feet supported;No upper extremity supported Sitting balance-[name]: Fair Sitting balance - Comments: S to maintain midline at EOB with B UE support   Standing balance support: Bilateral upper extremity supported;During functional activity Standing balance-[name]: Poor Standing balance comment: Reliant on UE support                            Cognition Arousal/Alertness: Awake/alert Behavior During Therapy: WFL for tasks assessed/performed Overall Cognitive Status: No family/caregiver present to determine baseline cognitive functioning                                 General Comments: patient requires cues for safety for mobility, transfers during session. answers questions appropriately      Exercises      General Comments General comments (skin integrity, edema, etc.): VSS  on RA, session limited by malaise from liquid stools/belly trouble today      Pertinent Vitals/Pain Pain Assessment: Faces Faces Pain Scale: Hurts little more Pain Location: back, hips, buttocks Pain Descriptors / Indicators: Aching;Discomfort Pain Intervention(s): Limited activity within patient's tolerance;Monitored during session    Home Living                      Prior Function            PT Goals (current goals can now be found in the care plan  section) Acute Rehab PT Goals Patient Stated Goal: Return home with family assist, continue with HHPT PT Goal Formulation: With patient Time For Goal Achievement: 05/02/19 Potential to Achieve Goals: Good Progress towards PT goals: Progressing toward goals    Frequency    Min 3X/week      PT Plan Current plan remains appropriate    Co-evaluation              AM-PAC PT "6 Clicks" Mobility   Outcome Measure  Help needed turning from your back to your side while in a flat bed without using bedrails?: A Little Help needed moving from lying on your back to sitting on the side of a flat bed without using bedrails?: A Little Help needed moving to and from a bed to a chair (including a wheelchair)?: A Little Help needed standing up from a chair using your arms (e.g., wheelchair or bedside chair)?: A Little Help needed to walk in hospital room?: A Little Help needed climbing 3-5 steps with a railing? : A Little 6 Click Score: 18    End of Session Equipment Utilized During Treatment: Gait belt Activity Tolerance: Other (comment)(limited by not feeling well/incontinent liquid stools) Patient left: in chair;with call bell/phone within reach;with chair alarm set Nurse Communication: Mobility status;Other (comment)(liquid stools) PT Visit Diagnosis: Other abnormalities of gait and mobility (R26.89);Pain     Time: 1130-1208 PT Time Calculation (min) (ACUTE ONLY): 38 min  Charges:  $Therapeutic Activity: 38-52 mins                     Windell Norfolk, DPT, PN1   Supplemental Physical Therapist [place]    Pager [phone] Acute Rehab Office [phone]

## 2019-04-19 NOTE — Progress Notes (Signed)
Patient eating dinner and got significantly choked. Patient's daughter in room and nurse tech witnessed. Patient recovered fine.  Dr. Darrick Meigs aware. New order received.

## 2019-04-19 NOTE — Care Management Important Message (Signed)
Important Message  Patient Details  Name: Betty Jackson MRN: [id number] Date of Birth: 12/31/1934   Medicare Important Message Given:  Yes     Shelda Altes 04/19/2019, 1:25 PM

## 2019-04-19 NOTE — Progress Notes (Signed)
Patient ID: Betty Jackson, female   DOB: [birth year], 85 y.o.   MRN: [id number] Talk to radiology, it appears that epidural injection will not be performed until tomorrow secondary to anticoagulation.  Dr. Lawrence Santiago will be doing it.  She can have a diet today.

## 2019-04-20 ENCOUNTER — Inpatient Hospital Stay (HOSPITAL_COMMUNITY): Payer: Medicare Other

## 2019-04-20 HISTORY — PX: IR EPIDUROGRAPHY: IMG2365

## 2019-04-20 LAB — CULTURE, BLOOD (ROUTINE X 2)
Culture: NO GROWTH
Culture: NO GROWTH
Special Requests: ADEQUATE

## 2019-04-20 LAB — CBC
HCT: 29.4 % — ABNORMAL LOW (ref 36.0–46.0)
Hemoglobin: 9.1 g/dL — ABNORMAL LOW (ref 12.0–15.0)
MCH: 28.9 pg (ref 26.0–34.0)
MCHC: 31 g/dL (ref 30.0–36.0)
MCV: 93.3 fL (ref 80.0–100.0)
Platelets: 210 10*3/uL (ref 150–400)
RBC: 3.15 MIL/uL — ABNORMAL LOW (ref 3.87–5.11)
RDW: 15.2 % (ref 11.5–15.5)
WBC: 10.9 10*3/uL — ABNORMAL HIGH (ref 4.0–10.5)
nRBC: 0.3 % — ABNORMAL HIGH (ref 0.0–0.2)

## 2019-04-20 MED ORDER — IOPAMIDOL (ISOVUE-M 200) INJECTION 41%
10.0000 mL | Freq: Once | INTRAMUSCULAR | Status: AC
  Administered 2019-04-20: 3 mL via EPIDURAL

## 2019-04-20 MED ORDER — LIDOCAINE HCL 1 % IJ SOLN
INTRAMUSCULAR | Status: AC
  Filled 2019-04-20: qty 20

## 2019-04-20 MED ORDER — APIXABAN 5 MG PO TABS
5.0000 mg | ORAL_TABLET | Freq: Two times a day (BID) | ORAL | Status: DC
  Administered 2019-04-20 – 2019-04-21 (×3): 5 mg via ORAL
  Filled 2019-04-20 (×3): qty 1

## 2019-04-20 MED ORDER — METHYLPREDNISOLONE ACETATE 80 MG/ML IJ SUSP
INTRAMUSCULAR | Status: AC
  Filled 2019-04-20: qty 1

## 2019-04-20 MED ORDER — IOPAMIDOL (ISOVUE-M 200) INJECTION 41%
INTRAMUSCULAR | Status: AC
  Filled 2019-04-20: qty 10

## 2019-04-20 MED ORDER — METHYLPREDNISOLONE ACETATE 40 MG/ML IJ SUSP
INTRAMUSCULAR | Status: AC
  Filled 2019-04-20: qty 1

## 2019-04-20 MED ORDER — STERILE WATER FOR INJECTION IJ SOLN
INTRAMUSCULAR | Status: AC
  Filled 2019-04-20: qty 10

## 2019-04-20 NOTE — Progress Notes (Signed)
Patient ID: Betty Jackson, female   DOB: 1935-01-01, 85 y.o.   MRN: [id number] Vital signs are stable Tolerated epidural injection well She is resting comfortably feels that she may be a little bit better We will check tomorrow to see how she is mobilizing

## 2019-04-20 NOTE — Progress Notes (Signed)
[facility] for Heparin>> apixaban Indication: atrial fibrillation  Allergies  Allergen Reactions  . Dextromethorphan Rash    Patient Measurements: Height: 5' 4.5" (163.8 cm) Weight: 155 lb (70.3 kg) IBW/kg (Calculated) : 55.85 Heparin Dosing Weight: 70  Vital Signs: Temp: 98.4 F (36.9 C) (01/21 1200) Temp Source: Oral (01/21 1200) BP: 165/76 (01/21 1200) Pulse Rate: 55 (01/21 1200)  Labs: Recent Labs    04/18/19 0241 04/18/19 0241 04/19/19 0404 04/20/19 0227  HGB 8.6*   < > 8.7* 9.1*  HCT 27.4*  --  28.0* 29.4*  PLT 161  --  191 210  APTT  --   --  58*  --   HEPARINUNFRC  --   --  >2.20*  --   CREATININE 1.07*  --  1.27*  --    < > = values in this interval not displayed.    Estimated Creatinine Clearance: 33.3 mL/min (A) (by C-G formula based on SCr of 1.27 mg/dL (H)).   Medical History: Past Medical History:  Diagnosis Date  . Abdominal pain, epigastric 06/22/2013  . Abdominal pain, other specified site 12/24/2011  . Abnormal urinalysis 05/20/2017  . Acute respiratory failure with hypoxia ([place]) 05/20/2017  . Anemia, unspecified 10/28/2012  . Anxiety state, unspecified 10/28/2012  . Bruises easily    d/t being on prednisone and plavix  . CAD (coronary artery disease)    a. Stent RCA in [facility];  b. Cath approx 2009 - nonobs per pt report.  . Cataract    immature on the left eye  . Chronic insomnia 02/07/2013  . Chronic lower back pain    scoliosis  . CKD (chronic kidney disease) stage 3, GFR 30-59 ml/min   . Complication of anesthesia    pt has a very high tolerance to meds  . Coronary disease 05/20/2017  . CVA (cerebral infarction) 10/29/2012  . DDD (degenerative disc disease)   . Depression   . Diverticulitis    hx of  . Diverticulosis   . Elevated transaminase level 02/07/2013  . Enteritis   . GERD (gastroesophageal reflux disease) 09/01/2010  . Giant cell arteritis ([place])   . Hemorrhoids   . Herniated  nucleus pulposus, L5-S1, right 11/04/2015  . History of scabies   . HTN (hypertension) 05/20/2017  . Hyperlipidemia    takes Lipitor daily  . Hypertension    takes Amlodipine,Losartan,Metoprolol,and HCTZ daily  . Incontinence of urine   . Influenza A 05/20/2017  . Insomnia    takes Restoril nightly  . Lumbar stenosis 04/25/2013  . Major depressive disorder, recurrent episode, moderate ([place]) 07/16/2013  . Migraines    "back in my 20's; none since" (10/28/2012)  . Osteoporosis   . PAF (paroxysmal atrial fibrillation) ([place]) 2011   a. lone epidode in 2011 according to notes.  . Rheumatoid arthritis ([place])   . Scoliosis   . Sepsis ([place]) 05/20/2017  . Sinus bradycardia    a. on chronic bb  . Temporal arteritis ([place]) 2011   a. followed @ [facility]; potential flareup 10/28/2012/notes 10/28/2012  . Vocal cord dysfunction    "they don't operate properly" (10/28/2012)    Assessment: 85 year old female with history of afib on apixaban. She was started heparin for an epidural injection (done ~8:30am on 1/21) -Hg= 9.1, SCr= 1.27  Spoke with Dr. Jobe Igo. Okay to restart apixaban 6 hours after procedure  Goal of Therapy:  Heparin level 0.3-0.7 units/ml Aptt 66-102 secs Monitor platelets by anticoagulation  protocol: Yes   Plan:  -restart apixaban 5mg  po bid at 3pm -CBC in am  Hildred Laser, PharmD Clinical Pharmacist **Pharmacist phone directory can now be found on [email or web address] (PW [email or web address]).  Listed under [place].

## 2019-04-20 NOTE — Progress Notes (Signed)
[facility]  PROGRESS NOTE  Betty Jackson [id number] DOB: 01/05/1935 DOA: 04/15/2019 PCP: Eulas Post, MD   Brief HPI:   85 year old female with past medical history of CKD stage III, CAD, stroke, not temporal arteritis, rheumatoid arthritis on chronic steroids, s/p L2-3, L3-4 posterior fusion by Dr. Ellene Route in 2015 and L5-S1 microdiscectomy by Dr. Ellene Route in 2017.  Patient's daughter noted that she was limping around and she was complaining of worsening back pain.  She was found to lethargic and confused by daughter.  Evaluation in the ED showed CT abdomen pelvis revealed a prior small nondisplaced fracture involving the left ischium now appears to be aggressive lytic process with marked periostitis.  There is also marked lucency of L2-L3-L4 vertebral bodies and possible T12.  She also had elevated troponin, cardiology was consulted.    Subjective   Patient seen and examined, s/p lumbar epidural steroid injection.   Assessment/Plan:     1. Left ischium fracture-nondisplaced, found incidentally on CT of the abdomen and pelvis.  She was seen by orthopedic surgery as well as neurosurgery.  As per orthopedic surgery she will be less ideal surgical candidate due to see history of severe osteoporosis.  MRI pelvis showed avulsion type fracture of left ischial tuberosity with more likely aggressive pattern of callus formation than actual bone lesion causing the periostitis.  No definite soft tissue mass identified.  She will need CT in 4 to 6 weeks. 2. Chronic low back pain-patient has multilevel bilateral foraminal stenosis most severe on the right at L5-S1.  S/p lumbar epidural steroid injection today. 3. ?  Sepsis-unclear etiology, patient was started empirically on cefepime and Zithromax.  Urine culture showed no growth.  MRI ruled out infectious process of spine.  Lactic acid was 2.6.  Chest x-ray showed possible pneumonia.  She has been afebrile, WBC improved to 8.6..  Will  discontinue antibiotics after tomorrow's dose to complete 5 days of antibiotic treatment. 4. Elevated troponin-patient denies chest pain, troponin trending down.  2D echo showed normal EF, no wall motion normalities.  Cardiology following.  Plan for ischemia work-up as outpatient. 5. Chronic atrial fibrillation-continue Cardizem, metoprolol, amiodarone.  Eliquis is on hold for epidural injection.  Patient started on heparin per pharmacy. 6. Chronic disease stage III-stable 7. Rheumatoid arthritis-continue prednisone    SpO2: 94 % O2 Flow Rate (L/min): 2 L/min     No results for input(s): DDIMER, FERRITIN, LDH, CRP in the last 72 hours.  Lab Results  Component Value Date   SARSCOV2NAA NEGATIVE 03/06/2019   [place] NEGATIVE 03/06/2019   [place] NEGATIVE 01/13/2019     CBG: No results for input(s): GLUCAP in the last 168 hours.  CBC: Recent Labs  Lab 04/15/19 1052 04/15/19 1148 04/16/19 0346 04/16/19 0346 04/16/19 0706 04/17/19 0759 04/18/19 0241 04/19/19 0404 04/20/19 0227  WBC 15.9*   < > 8.9   < > 15.5* 15.6* 12.8* 8.6 10.9*  NEUTROABS 11.9*  --  6.7  --   --   --   --   --   --   HGB 12.8   < > 7.4*   < > 10.6* 9.3* 8.6* 8.7* 9.1*  HCT 41.1   < > 24.3*   < > 33.5* 29.5* 27.4* 28.0* 29.4*  MCV 92.8   < > 97.2   < > 95.4 93.9 91.9 93.6 93.3  PLT 204   < > 112*   < > 146* 150 161 191 210   < > =  values in this interval not displayed.    Basic Metabolic Panel: Recent Labs  Lab 04/15/19 1840 04/16/19 0706 04/17/19 0759 04/18/19 0241 04/19/19 0404  NA 138 142 143 140 138  K 3.3* 4.2 4.4 4.3 4.0  CL 103 109 109 108 109  CO2 23 21* 24 21* 20*  GLUCOSE 104* 125* 126* 135* 125*  BUN 11 14 19  28* 30*  CREATININE 1.03* 1.06* 0.94 1.07* 1.27*  CALCIUM 7.6* 8.0* 8.8* 8.6* 8.7*  MG  --  1.3* 2.3 2.3  --   PHOS  --  4.2  --   --   --      Liver Function Tests: Recent Labs  Lab 04/15/19 1052 04/16/19 0706  AST 46* 29  ALT 36 26  ALKPHOS 101 79   BILITOT 2.5* 2.1*  PROT 6.3* 5.3*  ALBUMIN 3.3* 2.4*        DVT prophylaxis: Heparin  Code Status: DNR  Family Communication: No family at bedside  Disposition Plan: likely home when medically ready for discharge         Scheduled medications:  . apixaban  5 mg Oral BID  . vitamin C  1,000 mg Oral Daily  . cholecalciferol  1,000 Units Oral Daily  . diltiazem  60 mg Oral TID  . escitalopram  10 mg Oral Daily  . ferrous sulfate  325 mg Oral BID WC  . iopamidol      . metoprolol tartrate  100 mg Oral BID  . pantoprazole  40 mg Oral Daily  . predniSONE  30 mg Oral Q breakfast  . rosuvastatin  10 mg Oral QHS  . vitamin E  400 Units Oral Daily    Consultants:  Neurosurgery  Procedures:    Antibiotics:   Anti-infectives (From admission, onward)   Start     Dose/Rate Route Frequency Ordered Stop   04/17/19 1000  azithromycin (ZITHROMAX) 500 mg in sodium chloride 0.9 % 250 mL IVPB     500 mg 250 mL/hr over 60 Minutes Intravenous Every 24 hours 04/17/19 0932     04/16/19 1600  vancomycin (VANCOCIN) IVPB 1000 mg/200 mL premix  Status:  Discontinued     1,000 mg 200 mL/hr over 60 Minutes Intravenous Every 24 hours 04/15/19 1421 04/17/19 0931   04/16/19 0200  ceFEPIme (MAXIPIME) 2 g in sodium chloride 0.9 % 100 mL IVPB     2 g 200 mL/hr over 30 Minutes Intravenous Every 12 hours 04/15/19 1423     04/15/19 1430  vancomycin (VANCOREADY) IVPB 1500 mg/300 mL     1,500 mg 150 mL/hr over 120 Minutes Intravenous  Once 04/15/19 1421 04/15/19 1727   04/15/19 1345  ceFEPIme (MAXIPIME) 2 g in sodium chloride 0.9 % 100 mL IVPB     2 g 200 mL/hr over 30 Minutes Intravenous  Once 04/15/19 1338 04/15/19 1418   04/15/19 1345  metroNIDAZOLE (FLAGYL) IVPB 500 mg     500 mg 100 mL/hr over 60 Minutes Intravenous  Once 04/15/19 1338 04/15/19 1554   04/15/19 1345  vancomycin (VANCOCIN) IVPB 1000 mg/200 mL premix  Status:  Discontinued     1,000 mg 200 mL/hr over 60 Minutes  Intravenous  Once 04/15/19 1338 04/15/19 1355       Objective   Vitals:   04/19/19 2310 04/20/19 0400 04/20/19 1000 04/20/19 1200  BP: (!) 163/79 (!) 158/73 (!) 162/85 (!) 165/76  Pulse: 60 (!) 55 66 (!) 55  Resp: 20 13 13 16   Temp:  97.6 F (36.4 C) 98 F (36.7 C) 98.6 F (37 C) 98.4 F (36.9 C)  TempSrc: Oral Oral Oral Oral  SpO2: 97% 95% 93% 94%  Weight:      Height:        Intake/Output Summary (Last 24 hours) at 04/20/2019 1634 Last data filed at 04/20/2019 1001 Gross per 24 hour  Intake --  Output 800 ml  Net -800 ml    01/19 1901 - 01/21 0700 In: 1311.1 [I.V.:161.1] Out: 500 [Urine:500]  Filed Weights   04/15/19 1048  Weight: 70.3 kg    Physical Examination:    General-appears in no acute distress  Heart-S1-S2, regular, no murmur auscultated  Lungs-clear to auscultation bilaterally, no wheezing or crackles auscultated  Abdomen-soft, nontender, no organomegaly  Extremities-no edema in the lower extremities  Neuro-alert, oriented x3, no focal deficit noted    Data Reviewed:   Recent Results (from the past 240 hour(s))  Blood Culture (routine x 2)     Status: None   Collection Time: 04/15/19 10:52 AM   Specimen: BLOOD  Result Value Ref Range Status   Specimen Description BLOOD LEFT ANTECUBITAL  Final   Special Requests   Final    BOTTLES DRAWN AEROBIC AND ANAEROBIC Blood Culture results may not be optimal due to an inadequate volume of blood received in culture bottles   Culture   Final    NO GROWTH 5 DAYS Performed at [facility], [place] [address], [place], [place] [address]    Report Status 04/20/2019 FINAL  Final  Blood Culture (routine x 2)     Status: None   Collection Time: 04/15/19 10:54 AM   Specimen: BLOOD  Result Value Ref Range Status   Specimen Description BLOOD RIGHT ANTECUBITAL  Final   Special Requests   Final    BOTTLES DRAWN AEROBIC AND ANAEROBIC Blood Culture adequate volume   Culture   Final    NO GROWTH 5  DAYS Performed at [facility], [place] [address], [place], [place] [address]    Report Status 04/20/2019 FINAL  Final  Urine culture     Status: None   Collection Time: 04/15/19  4:49 PM   Specimen: In/Out Cath Urine  Result Value Ref Range Status   Specimen Description IN/OUT CATH URINE  Final   Special Requests NONE  Final   Culture   Final    NO GROWTH Performed at [facility], [place] [address] [place], [place] [address]    Report Status 04/16/2019 FINAL  Final  MRSA PCR Screening     Status: None   Collection Time: 04/16/19 12:33 PM   Specimen: Nasal Mucosa; Nasopharyngeal  Result Value Ref Range Status   MRSA by PCR NEGATIVE NEGATIVE Final    Comment:        The GeneXpert MRSA Assay (FDA approved for NASAL specimens only), is one component of a comprehensive MRSA colonization surveillance program. It is not intended to diagnose MRSA infection nor to guide or monitor treatment for MRSA infections. Performed at [facility], [place] [address] [place], [place] [address]     Recent Labs  Lab 04/15/19 1052  LIPASE 28   Recent Labs  Lab 04/15/19 1134  AMMONIA 28    Cardiac Enzymes: Recent Labs  Lab 04/15/19 1840  CKTOTAL 78   BNP (last 3 results) Recent Labs    04/15/19 1052 04/18/19 0241  BNP 170.2* 930.1*      Admission status: Inpatient:  Based on patients clinical presentation and evaluation of above clinical data, I have made determination that patient meets Inpatient criteria at this time.   Oswald Hillock   [facility] If 7PM-7AM, please contact night-coverage at [email or web address], Office  [phone]  password [email or web address]  04/20/2019, 4:34 PM  LOS: 5 days

## 2019-04-20 NOTE — Consult Note (Signed)
Chief Complaint: Patient was seen in consultation today for lumbar epidural steroid injection Chief Complaint  Patient presents with  . Altered Mental Status   at the request of Dr Betty Jackson    Supervising Physician: Dr Betty Jackson  Patient Status: [facility] - In-pt  History of Present Illness: Betty Jackson is a 85 y.o. female   CKD; CAD; CVA; temporal arteritis; Rh arthritis Hx L3-4 fusion with Dr Betty Jackson 2015 L5-S1 microdiscectomy 2017  Pt with long Hx low back pain Has had an injection in [place] yrs ago and was helpful for back pain Back pain now in low back and travels to Bilat buttocks No radiation to lower extremities  Pt was admitted to [facility] for fever and infection; confusion Evaluation in the ED showed CT abdomen pelvis revealed a prior small nondisplaced fracture involving the left ischium now appears to be aggressive lytic process with marked periostitis.  There is also marked lucency of L2-L3-L4 vertebral bodies and possible T12.  Elevated troponins-- demand ischemia Cardio consult -- no acute recommendations Plan for OP evaluation  MR 04/16/19: IMPRESSION: 1. No acute fracture, marrow replacing lesion, or evidence of discitis. 2. Moderate to severe multifactorial canal stenosis at T10-T11 and T11-12 with evidence of cord compression. Subtly increased STIR signal within the cord at these levels suggestive of compressive myelopathy. 3. Moderate to severe canal stenosis at T12-L1 and L4-5. 4. Multilevel bilateral foraminal stenosis most severe on the right at L5-S1. 5. Prior posterior instrumented fusion and decompression at L2-L3 and L3-L4. No significant residual foraminal or canal stenosis at fused levels.  Ortho surgery feels not a surgical candidate secondary severe osteoporosis  Dr Betty Jackson note 1/19: It appears that her sacral insufficiency fracture is healing nicely.  The ischial fracture is just that and it appears to be stable.  No malignancies are  noted. [name] has extensive degenerative changes throughout her thoracic and lumbar spines.  She has areas of stenosis at T10-T11 and T11-T12 which are mild and not impeding any neurologic function.  She has moderately severe spondylosis and stenosis at L4-5 and L5-S1 below her L2-L4 fusion.  I suspect that these areas create the greatest amount of back pain with weightbearing which is her primary problem.  She reports radiation to the buttocks on both sides but no radiating pain into the lower extremities beyond that.  She is a terrible surgical candidate for any surgical repair due to extensive osteoporosis her rheumatoid disease and cardiac condition that she is recently recovering from.  In that regard I believe that she may be best treated with epidural steroid injection for pain control and other pain controlling measures.  I discussed this with her today and we will plan on doing a lumbar epidural injection.   Scheduled now for same Imaging reviewed and procedure approved with Dr Betty Jackson   Past Medical History:  Diagnosis Date  . Abdominal pain, epigastric 06/22/2013  . Abdominal pain, other specified site 12/24/2011  . Abnormal urinalysis 05/20/2017  . Acute respiratory failure with hypoxia ([place]) 05/20/2017  . Anemia, unspecified 10/28/2012  . Anxiety state, unspecified 10/28/2012  . Bruises easily    d/t being on prednisone and plavix  . CAD (coronary artery disease)    a. Stent RCA in [facility];  b. Cath approx 2009 - nonobs per pt report.  . Cataract    immature on the left eye  . Chronic insomnia 02/07/2013  . Chronic lower back pain    scoliosis  .  CKD (chronic kidney disease) stage 3, GFR 30-59 ml/min   . Complication of anesthesia    pt has a very high tolerance to meds  . Coronary disease 05/20/2017  . CVA (cerebral infarction) 10/29/2012  . DDD (degenerative disc disease)   . Depression   . Diverticulitis    hx of  . Diverticulosis   . Elevated transaminase level  02/07/2013  . Enteritis   . GERD (gastroesophageal reflux disease) 09/01/2010  . Giant cell arteritis ([place])   . Hemorrhoids   . Herniated nucleus pulposus, L5-S1, right 11/04/2015  . History of scabies   . HTN (hypertension) 05/20/2017  . Hyperlipidemia    takes Lipitor daily  . Hypertension    takes Amlodipine,Losartan,Metoprolol,and HCTZ daily  . Incontinence of urine   . Influenza A 05/20/2017  . Insomnia    takes Restoril nightly  . Lumbar stenosis 04/25/2013  . Major depressive disorder, recurrent episode, moderate ([place]) 07/16/2013  . Migraines    "back in my 20's; none since" (10/28/2012)  . Osteoporosis   . PAF (paroxysmal atrial fibrillation) ([place]) 2011   a. lone epidode in 2011 according to notes.  . Rheumatoid arthritis ([place])   . Scoliosis   . Sepsis ([place]) 05/20/2017  . Sinus bradycardia    a. on chronic bb  . Temporal arteritis ([place]) 2011   a. followed @ [facility]; potential flareup 10/28/2012/notes 10/28/2012  . Vocal cord dysfunction    "they don't operate properly" (10/28/2012)    Past Surgical History:  Procedure Laterality Date  . ABDOMINAL HYSTERECTOMY  ~ 1984   vaginally  . BACK SURGERY  7-64yrs ago   X Stop  . BLADDER SUSPENSION  2001  . BREAST BIOPSY Right   . CATARACT EXTRACTION W/ INTRAOCULAR LENS IMPLANT Right ~ 08/2012  . CHEST TUBE INSERTION Left 03/08/2019   Procedure: Chest Tube Insertion;  Surgeon: Betty Poot, MD;  Location: [place];  Service: Thoracic;  Laterality: Left;  . COLONOSCOPY  01/26/2012   Procedure: COLONOSCOPY;  Surgeon: Betty Artist, MD,FACG;  Location: [facility] ENDOSCOPY;  Service: Endoscopy;  Laterality: N/A;  note the EGD is possible  . CORONARY ANGIOPLASTY WITH STENT PLACEMENT  2006   X 1 stent  . EPIDURAL BLOCK INJECTION    . ESOPHAGOGASTRODUODENOSCOPY  01/26/2012   Procedure: ESOPHAGOGASTRODUODENOSCOPY (EGD);  Surgeon: Betty Artist, MD,FACG;  Location: [facility] ENDOSCOPY;  Service: Endoscopy;  Laterality: N/A;  . HEMIARTHROPLASTY HIP Right 2012   . LAPAROSCOPIC CHOLECYSTECTOMY  2001  . LUMBAR FUSION  03/2013  . LUMBAR LAMINECTOMY/DECOMPRESSION MICRODISCECTOMY Right 11/04/2015   Procedure: Right Lumbar Five-Sacral One Microdiskectomy;  Surgeon: Kristeen Miss, MD;  Location: [place] NEURO ORS;  Service: Neurosurgery;  Laterality: Right;  Right L5-S1 Microdiskectomy  . moles removed that required stiches     one on leg and one on face  . SUBXYPHOID PERICARDIAL WINDOW N/A 03/08/2019   Procedure: SUBXYPHOID PERICARDIAL WINDOW;  Surgeon: Betty Poot, MD;  Location: [place];  Service: Thoracic;  Laterality: N/A;  . TEE WITHOUT CARDIOVERSION N/A 03/08/2019   Procedure: TRANSESOPHAGEAL ECHOCARDIOGRAM (TEE);  Surgeon: Prescott Gum, Collier Salina, MD;  Location: [place];  Service: Thoracic;  Laterality: N/A;  . TEMPORAL ARTERY BIOPSY / LIGATION Bilateral 2011  . TONSILLECTOMY AND ADENOIDECTOMY     at age 75  . TUBAL LIGATION  ~ 1982  . X-STOP IMPLANTATION  ~ 2010   "lower back" (10/28/2012)    Allergies: Dextromethorphan  Medications: Prior to Admission medications   Medication Sig Start Date  End Date Taking? Authorizing Provider  amiodarone (PACERONE) 200 MG tablet Take 200 mg by mouth daily.   Yes [provider]  cholecalciferol (VITAMIN D) 1000 UNITS tablet Take 1,000 Units by mouth daily.    Yes [provider]  dicyclomine (BENTYL) 20 MG tablet Take 1 tablet (20 mg total) by mouth 2 (two) times daily as needed for spasms. 01/12/19  Yes Gareth Morgan, MD  diltiazem (CARDIZEM) 60 MG tablet Take 1 tablet (60 mg total) by mouth 3 (three) times daily. 01/31/19  Yes McAlhany, Annita Brod, MD  ELIQUIS 5 MG TABS tablet TAKE 1 TABLET BY MOUTH TWICE A DAY Patient taking differently: Take 5 mg by mouth 2 (two) times daily.  11/15/18  Yes Burnell Blanks, MD  escitalopram (LEXAPRO) 10 MG tablet TAKE 1 TABLET BY MOUTH EVERY DAY Patient taking differently: Take 10 mg by mouth daily.  12/14/18  Yes Burchette, Alinda Sierras, MD  furosemide  (LASIX) 20 MG tablet Take 2 tablets (40 mg total) by mouth 2 (two) times daily. 03/13/19 04/15/19 Yes Ikramullah, Mir Mohammed, MD  HYDROcodone-acetaminophen (NORCO/VICODIN) 5-325 MG tablet Take one tablet by mouth every 8 hours as needed for pain. May refill in two months. Patient taking differently: Take 1 tablet by mouth every 6 (six) hours as needed for moderate pain.  08/05/18  Yes Burchette, Alinda Sierras, MD  losartan (COZAAR) 100 MG tablet Take 100 mg by mouth daily.   Yes [provider]  metoprolol tartrate (LOPRESSOR) 100 MG tablet Take 1 tablet (100 mg total) by mouth 2 (two) times daily. 01/20/19  Yes Rai, Ripudeep K, MD  ondansetron (ZOFRAN ODT) 4 MG disintegrating tablet Take 1 tablet (4 mg total) by mouth every 8 (eight) hours as needed for nausea or vomiting. 01/12/19  Yes Gareth Morgan, MD  pantoprazole (PROTONIX) 40 MG tablet TAKE 1 TABLET BY MOUTH EVERY DAY Patient taking differently: Take 40 mg by mouth daily.  02/17/19  Yes Burchette, Alinda Sierras, MD  potassium chloride SA (KLOR-CON) 20 MEQ tablet Take 1 tablet (20 mEq total) by mouth daily. 01/20/19  Yes Rai, Ripudeep K, MD  predniSONE (DELTASONE) 10 MG tablet 4 tablets for 3 days, 3 tablets for 3 days, 2 tablets for 3 days, 1 tablet continue Patient taking differently: Take 10 mg by mouth daily.  05/26/17  Yes Reyne Dumas, MD  rosuvastatin (CRESTOR) 10 MG tablet Take 10 mg by mouth at bedtime. 03/05/19  Yes [provider]  vitamin C (ASCORBIC ACID) 500 MG tablet Take 1,000 mg by mouth daily.    Yes [provider]  vitamin E (VITAMIN E) 400 UNIT capsule Take 400 Units by mouth daily.   Yes [provider]  lidocaine (XYLOCAINE) 5 % ointment Apply 1 application topically 3 (three) times daily as needed. Patient not taking: Reported on 04/15/2019 04/13/19   Long, Wonda Olds, MD     Family History  Problem Relation Age of Onset  . Brain cancer Mother 17  . Heart attack Father   . Breast cancer  Daughter 35  . Heart disease Other   . Hypertension Other   . Arthritis Neg Hx   . Colon cancer Neg Hx   . Osteoporosis Neg Hx     Social History   Socioeconomic History  . Marital status: Married    Spouse name: Not on file  . Number of children: 3  . Years of education: Not on file  . Highest education level: Not on file  Occupational  History  . Occupation: Retired Cabin crew  Tobacco Use  . Smoking status: Never Smoker  . Smokeless tobacco: Never Used  Substance and Sexual Activity  . Alcohol use: Yes    Comment: socially  . Drug use: No  . Sexual activity: Not on file  Other Topics Concern  . Not on file  Social History Narrative   Lives in [place] with her husband.  Retired Cabin crew.   Social Determinants of Health   Financial Resource Strain:   . Difficulty of Paying Living Expenses: Not on file  Food Insecurity:   . Worried About Charity fundraiser in the Last Year: Not on file  . Ran Out of Food in the Last Year: Not on file  Transportation Needs:   . Lack of Transportation (Medical): Not on file  . Lack of Transportation (Non-Medical): Not on file  Physical Activity:   . Days of Exercise per Week: Not on file  . Minutes of Exercise per Session: Not on file  Stress:   . Feeling of Stress : Not on file  Social Connections:   . Frequency of Communication with Friends and Family: Not on file  . Frequency of Social Gatherings with Friends and Family: Not on file  . Attends Religious Services: Not on file  . Active Member of Clubs or Organizations: Not on file  . Attends Archivist Meetings: Not on file  . Marital Status: Not on file    Review of Systems: A 12 point ROS discussed and pertinent positives are indicated in the HPI above.  All other systems are negative.   Vital Signs: BP (!) 158/73 (BP Location: Right Arm)   Pulse (!) 55   Temp 98 F (36.7 C) (Oral)   Resp 13   Ht 5' 4.5" (1.638 m)   Wt 155 lb (70.3 kg)   SpO2 95%   BMI  26.19 kg/m   Physical Exam Vitals reviewed.  Cardiovascular:     Rate and Rhythm: Normal rate and regular rhythm.  Pulmonary:     Breath sounds: Normal breath sounds.  Musculoskeletal:        General: Normal range of motion.     Comments: FROM Moves all 4s Bilat leg raises are difficult but able Pain remains in low back  Skin:    General: Skin is warm and dry.  Neurological:     Mental Status: She is alert and oriented to person, place, and time.  Psychiatric:        Behavior: Behavior normal.     Imaging: DG Chest 2 View  Result Date: 03/29/2019 CLINICAL DATA:  Pericardial window.  History of hypertension EXAM: CHEST - 2 VIEW COMPARISON:  03/11/2019 FINDINGS: There is been interval removal of a right-sided IJ approach central venous catheter. Single median sternotomy wire is noted. The heart size and mediastinal contours are within normal limits. Calcific aortic knob. Linear scarring versus atelectasis in the peripheral aspect of the left lung base. Lungs are otherwise clear. No pleural effusion or pneumothorax. Cement augmentation and fusion hardware at the lower thoracic spine. IMPRESSION: No active cardiopulmonary disease. Electronically Signed   By: Davina Poke D.O.   On: 03/29/2019 14:52   CT Angio Chest PE W and/or Wo Contrast  Result Date: 04/15/2019 CLINICAL DATA:  Shortness of breath.  Nausea and vomiting. EXAM: CT ANGIOGRAPHY CHEST CT ABDOMEN AND PELVIS WITH CONTRAST TECHNIQUE: Multidetector CT imaging of the chest was performed using the standard protocol during bolus administration of intravenous  contrast. Multiplanar CT image reconstructions and MIPs were obtained to evaluate the vascular anatomy. Multidetector CT imaging of the abdomen and pelvis was performed using the standard protocol during bolus administration of intravenous contrast. CONTRAST:  181mL OMNIPAQUE IOHEXOL 350 MG/ML SOLN COMPARISON:  Abdominal CT scan 03/06/2019 the FINDINGS: CTA CHEST FINDINGS  Cardiovascular: The heart is normal in size and stable. There is stable prominent pericardial fat and a small persistent pericardial effusion. The aorta is normal in caliber. Scattered atherosclerotic calcifications. Stable coronary artery calcifications. The pulmonary arterial tree is well opacified. No filling defects to suggest pulmonary embolism. Mediastinum/Nodes: Stable scattered mediastinal and hilar lymph nodes and scattered calcified nodes. The esophagus is grossly normal. Lungs/Pleura: Mosaic pattern of ground-glass attenuation in the lungs diffusely likely due to small airways disease such as asthma, constrictive, acute or respiratory bronchiolitis, hypersensitivity pneumonitis or cryptogenic organizing pneumonia. No focal infiltrates or worrisome pulmonary lesions. There are patchy areas of subsegmental atelectasis and a small right pleural effusion. Musculoskeletal: No breast masses, supraclavicular or axillary adenopathy. The bony thorax is intact. Vertebral augmentation changes are noted at T11. Review of the MIP images confirms the above findings. CT ABDOMEN and PELVIS FINDINGS Hepatobiliary: No focal hepatic lesions are identified. There is mild intra and extrahepatic biliary dilatation likely due to prior cholecystectomy. The portal and hepatic veins are patent. Pancreas: No mass, inflammation or ductal dilatation. Stable moderate atrophy. Spleen: Normal size.  Small calcified granulomas. Adrenals/Urinary Tract: The adrenal glands and kidneys are unremarkable. The bladder appears normal. Stomach/Bowel: The stomach, duodenum, small bowel and colon are grossly normal. No acute inflammatory changes, mass lesions or obstructive findings. The terminal ileum and appendix are normal. Descending colon and sigmoid colon diverticulosis but no findings for acute diverticulitis. Vascular/Lymphatic: Advanced atherosclerotic calcifications involving the aorta and iliac arteries. No aneurysm or dissection. The  branch vessels are patent. The major venous structures are patent. No mesenteric or retroperitoneal mass or adenopathy. Small scattered lymph nodes are stable. Reproductive: Surgically absent. Other: No pelvic mass or free pelvic fluid collections. No inguinal mass or adenopathy. Musculoskeletal: A stable appearing right hip prosthesis. Advanced degenerative changes involving the left hip. Postoperative changes involving the spine with marked lytic appearance of L2, L3 and L4. T12 also appears somewhat expanded and lytic. There is also a aggressive lytic lesion involving the left ischium and marked periostitis. Findings suspicious for osseous metastatic disease. Review of the MIP images confirms the above findings. IMPRESSION: 1. No CT findings for pulmonary embolism. 2. No aortic aneurysm or dissection. 3. Persistent small pericardial effusion and prominent pericardial fat. 4. Mosaic pattern of ground-glass attenuation throughout the lungs most likely small airways disease. Please see above discussion. 5. Small right pleural effusion and overlying atelectasis. 6. No acute abdominal/pelvic findings, mass lesions or adenopathy. 7. Prior small nondisplaced fracture involving the left ischium now appears to be an aggressive lytic process with marked periostitis. There is also marked lucency of the L2, L3 and L4 vertebral bodies and possibly T12. Could not exclude metastatic disease. Electronically Signed   By: Marijo Sanes M.D.   On: 04/15/2019 13:50   CT PELVIS WO CONTRAST  Result Date: 03/27/2019 CLINICAL DATA:  Sacral insufficiency fractures EXAM: CT PELVIS WITHOUT CONTRAST TECHNIQUE: Multidetector CT imaging of the pelvis was performed following the standard protocol without intravenous contrast. COMPARISON:  CT 03/06/2019 FINDINGS: Bones/Joint/Cartilage Bandlike area of sclerosis within the periphery of the right sacral ala compatible with evolving subacute to chronic sacral insufficiency fracture. Fracture  line is  not well seen. There is no evidence of involvement of the sacral neural foramina. SI joint is not widened. Robust periosteal new bone formation at site of subacute left ischial tuberosity avulsion fracture. Approximately 1.3 cm of fracture diastasis, similar to prior. Ill-defined appearance of the fracture margins favored to reflect hyperemia/healing changes (series 4, image 61; series 5, image 94). Visualized portion of right total hip arthroplasty and lumbar fusion hardware appear grossly intact without evidence of complication. No new fractures are evident. Ligaments Suboptimally evaluated by CT. Muscles and Tendons Left hamstring origin avulsion injury. Remaining tendinous structures about the pelvis appear grossly intact. No discrete muscular abnormalities. Soft tissues Sigmoid diverticulosis without evidence of diverticulitis. Aortoiliac atherosclerotic calcifications. No soft tissue fluid collections. IMPRESSION: 1. Evolving subacute to chronic right sacral insufficiency fracture. 2. Robust periosteal new bone formation at site of subacute left ischial tuberosity avulsion fracture. Ill-defined appearance of the fracture margins favored to reflect hyperemia/healing changes. 3. Visualized portion of the right total hip arthroplasty and lumbar fusion hardware appear grossly intact without evidence of complication. 4. Sigmoid diverticulosis without evidence of diverticulitis. 5. Aortic atherosclerosis. Aortic Atherosclerosis (ICD10-I70.0). Electronically Signed   By: Davina Poke D.O.   On: 03/27/2019 15:38   MR THORACIC SPINE W WO CONTRAST  Result Date: 04/16/2019 CLINICAL DATA:  Back pain. Abnormal CT EXAM: MRI THORACIC WITHOUT AND WITH CONTRAST TECHNIQUE: Multiplanar and multiecho pulse sequences of the thoracic spine were obtained without and with intravenous contrast. CONTRAST:  45mL GADAVIST GADOBUTROL 1 MMOL/ML IV SOLN COMPARISON:  CT 04/15/2019 FINDINGS: MRI THORACIC SPINE FINDINGS  Alignment: Thoracic vertebral alignment is maintained without significant listhesis. Localizer sequences which include the cervical spine demonstrate grade 1 anterolisthesis C3 on C4, C4 on C5, and C5 on C6. Vertebrae: Low T1-T2 signal changes within the T11 and L1 vertebral segments compatible with prior cement augmentation. No acute fractures. No suspicious bone lesion. Scattered intraosseous hemangiomas including within the T8 and T9 vertebrae. No evidence of discitis. Cord: Increased cord signal at T10-11 and T11-12 where there is high-grade canal stenosis and cord compression (series 36, image 29; series 35, image 9). The remaining thoracic cord is otherwise normal in signal and caliber. Paraspinal and other soft tissues: Small right pleural effusion. Disc levels: T1-T2: Mild left foraminal narrowing secondary to facet hypertrophy. No canal stenosis. T2-T3: Mild diffuse disc bulge without foraminal or canal stenosis. T3-T4: Mild diffuse disc bulge without foraminal or canal stenosis. T4-T5: No significant canal or neural foraminal narrowing T5-T6: No significant canal or neural foraminal narrowing T6-T7: No significant canal or neural foraminal narrowing T7-T8: No significant canal or neural foraminal narrowing T8-T9: No significant canal or neural foraminal narrowing T9-T10: No significant canal or neural foraminal narrowing T10-T11: Left paracentral disc bulge and posterior element hypertrophy result in moderate to severe canal stenosis with compression of the cord and increased T2 cord signal. There is moderate to severe left and mild right foraminal stenosis. T11-T12: Diffuse disc bulge, eccentric to the left, posterior element hypertrophy, and prominent posterior epidural fat contribute to moderate to severe canal stenosis with compression of the cord and increased T2 cord signal. Severe left foraminal stenosis. IMPRESSION: 1. Multifactorial moderate to severe canal stenosis at T10-11 and T11-12 with  compression of the lower thoracic cord and increased T2 cord signal suggesting compressive myelopathy. 2. Severe left foraminal stenosis at T11-T12 and severe left foraminal stenosis at T11-T12. 3. No acute fracture or suspicious marrow replacing lesion. 4. Small right pleural effusion. Electronically Signed  By: Davina Poke D.O.   On: 04/16/2019 12:47   MR Lumbar Spine W Wo Contrast  Addendum Date: 04/16/2019   ADDENDUM REPORT: 04/16/2019 13:12 ADDENDUM: Findings were discussed via telephone with Dr. Tyrell Antonio at 1:10 p.m. on 04/16/2019 by Dr. Davina Poke. Electronically Signed   By: Davina Poke D.O.   On: 04/16/2019 13:12   Result Date: 04/16/2019 CLINICAL DATA:  Abnormal appearance of the lumbar spine on recent CT. Concern for osseous metastatic disease. History of lumbar fusion EXAM: MRI LUMBAR SPINE WITHOUT AND WITH CONTRAST TECHNIQUE: Multiplanar and multiecho pulse sequences of the lumbar spine were obtained without and with intravenous contrast. CONTRAST:  59mL GADAVIST GADOBUTROL 1 MMOL/ML IV SOLN COMPARISON:  CT 04/15/2019. MRI 08/13/2016 FINDINGS: Segmentation:  Standard. Alignment:  Lumbar levocurvature. Trace retrolisthesis L1 on L2. Vertebrae: Prior posterior instrumented fusion and decompression at L2-L4. Susceptibility artifact related to hardware slightly degrades evaluation of the adjacent structures. Low T1/T2 signal related to prior cement augmentation within the L1 and T11 vertebral bodies. Bone marrow signal is mildly heterogeneous and largely fatty replaced. No evidence of fracture, marrow replacing lesion, or discitis. Conus medullaris and cauda equina: Conus extends to the L1-L2 level. Cauda equina appear normal. The lower cord is mildly increased in signal the T11-12 through T12-L1 levels. Paraspinal and other soft tissues: Negative. Disc levels: T10-T11: Sagittal sequences only. Diffuse disc bulge and posterior element hypertrophy results in moderate to severe canal  stenosis with evidence the cord compression (series 9, image 8). T11-T12: Sagittal sequences only. Diffuse disc bulge and posterior element hypertrophy result in moderate to severe canal stenosis with evidence of cord compression (series 9, image 8). T12-L1: Diffuse disc bulge and posterior element hypertrophy result in moderate canal stenosis and mild-to-moderate bilateral foraminal stenosis, left worse than right. L1-L2: Slight retropulsion and endplate ridging with mild posterior element hypertrophy resulting in mild-to-moderate right foraminal stenosis and mild canal stenosis. L2-L3: Prior fusion and decompression without evidence of significant residual foraminal or canal stenosis. L3-L4: Prior fusion and decompression without evidence of significant residual foraminal or canal stenosis. L4-L5: Diffuse disc bulge with right paracentral extrusion with 16 mm caudal extension of disc material (series 13, image 8; series 11, image 30). Moderate to severe canal stenosis at this level. Moderate left foraminal stenosis with prominent left-sided facet arthropathy contributing. L5-S1: Mild diffuse disc bulge with endplate ridging and bilateral facet arthropathy. Severe right and mild left foraminal stenosis and mild-to-moderate canal stenosis. IMPRESSION: 1. No acute fracture, marrow replacing lesion, or evidence of discitis. 2. Moderate to severe multifactorial canal stenosis at T10-T11 and T11-12 with evidence of cord compression. Subtly increased STIR signal within the cord at these levels suggestive of compressive myelopathy. 3. Moderate to severe canal stenosis at T12-L1 and L4-5. 4. Multilevel bilateral foraminal stenosis most severe on the right at L5-S1. 5. Prior posterior instrumented fusion and decompression at L2-L3 and L3-L4. No significant residual foraminal or canal stenosis at fused levels. These results will be called to the ordering clinician or representative by the Radiologist Assistant, and  communication documented in the PACS or zVision Dashboard. Electronically Signed: By: Davina Poke D.O. On: 04/16/2019 12:28   MR PELVIS W WO CONTRAST  Result Date: 04/17/2019 CLINICAL DATA:  Evaluate left ischial lesion seen on recent CT scan. EXAM: MRI PELVIS WITHOUT AND WITH CONTRAST TECHNIQUE: Multiplanar multisequence MR imaging of the pelvis was performed both before and after administration of intravenous contrast. CONTRAST:  57mL GADAVIST GADOBUTROL 1 MMOL/ML IV SOLN COMPARISON:  CT scan 04/15/2019 FINDINGS: Diffuse low T1 and high T2 signal intensity in the ischial tuberosity without definite soft tissue component. On the coronal images this appears to be fairly well circumscribed and has the appearance of a progressive avulsion fracture at the ischial tuberosity with probable aggressive callus formation due to motion by the pull of the hamstring tendons. I do not see a definite neoplastic process. No other pelvic lesions are identified. Moderate edema like signal changes in the left hip musculature, most notably the gluteus minimus and medius muscles and the abductor muscles likely muscle tear/muscle strains. Similar findings involving the right abductor muscles. No significant intrapelvic abnormalities. Moderate artifact from the right hip prosthesis. IMPRESSION: 1. Progressive avulsion type fracture at the left ischial tuberosity with more likely an aggressive pattern of callus formation than an actual bone lesion causing the periostitis. No definite soft tissue mass is identified and no other bone lesions are identified. 2. Recommend follow-up CT scan in 4-6 weeks to reassess healing changes. Electronically Signed   By: Marijo Sanes M.D.   On: 04/17/2019 16:27   CT ABDOMEN PELVIS W CONTRAST  Result Date: 04/15/2019 CLINICAL DATA:  Shortness of breath.  Nausea and vomiting. EXAM: CT ANGIOGRAPHY CHEST CT ABDOMEN AND PELVIS WITH CONTRAST TECHNIQUE: Multidetector CT imaging of the chest was  performed using the standard protocol during bolus administration of intravenous contrast. Multiplanar CT image reconstructions and MIPs were obtained to evaluate the vascular anatomy. Multidetector CT imaging of the abdomen and pelvis was performed using the standard protocol during bolus administration of intravenous contrast. CONTRAST:  173mL OMNIPAQUE IOHEXOL 350 MG/ML SOLN COMPARISON:  Abdominal CT scan 03/06/2019 the FINDINGS: CTA CHEST FINDINGS Cardiovascular: The heart is normal in size and stable. There is stable prominent pericardial fat and a small persistent pericardial effusion. The aorta is normal in caliber. Scattered atherosclerotic calcifications. Stable coronary artery calcifications. The pulmonary arterial tree is well opacified. No filling defects to suggest pulmonary embolism. Mediastinum/Nodes: Stable scattered mediastinal and hilar lymph nodes and scattered calcified nodes. The esophagus is grossly normal. Lungs/Pleura: Mosaic pattern of ground-glass attenuation in the lungs diffusely likely due to small airways disease such as asthma, constrictive, acute or respiratory bronchiolitis, hypersensitivity pneumonitis or cryptogenic organizing pneumonia. No focal infiltrates or worrisome pulmonary lesions. There are patchy areas of subsegmental atelectasis and a small right pleural effusion. Musculoskeletal: No breast masses, supraclavicular or axillary adenopathy. The bony thorax is intact. Vertebral augmentation changes are noted at T11. Review of the MIP images confirms the above findings. CT ABDOMEN and PELVIS FINDINGS Hepatobiliary: No focal hepatic lesions are identified. There is mild intra and extrahepatic biliary dilatation likely due to prior cholecystectomy. The portal and hepatic veins are patent. Pancreas: No mass, inflammation or ductal dilatation. Stable moderate atrophy. Spleen: Normal size.  Small calcified granulomas. Adrenals/Urinary Tract: The adrenal glands and kidneys are  unremarkable. The bladder appears normal. Stomach/Bowel: The stomach, duodenum, small bowel and colon are grossly normal. No acute inflammatory changes, mass lesions or obstructive findings. The terminal ileum and appendix are normal. Descending colon and sigmoid colon diverticulosis but no findings for acute diverticulitis. Vascular/Lymphatic: Advanced atherosclerotic calcifications involving the aorta and iliac arteries. No aneurysm or dissection. The branch vessels are patent. The major venous structures are patent. No mesenteric or retroperitoneal mass or adenopathy. Small scattered lymph nodes are stable. Reproductive: Surgically absent. Other: No pelvic mass or free pelvic fluid collections. No inguinal mass or adenopathy. Musculoskeletal: A stable appearing right hip prosthesis. Advanced degenerative changes  involving the left hip. Postoperative changes involving the spine with marked lytic appearance of L2, L3 and L4. T12 also appears somewhat expanded and lytic. There is also a aggressive lytic lesion involving the left ischium and marked periostitis. Findings suspicious for osseous metastatic disease. Review of the MIP images confirms the above findings. IMPRESSION: 1. No CT findings for pulmonary embolism. 2. No aortic aneurysm or dissection. 3. Persistent small pericardial effusion and prominent pericardial fat. 4. Mosaic pattern of ground-glass attenuation throughout the lungs most likely small airways disease. Please see above discussion. 5. Small right pleural effusion and overlying atelectasis. 6. No acute abdominal/pelvic findings, mass lesions or adenopathy. 7. Prior small nondisplaced fracture involving the left ischium now appears to be an aggressive lytic process with marked periostitis. There is also marked lucency of the L2, L3 and L4 vertebral bodies and possibly T12. Could not exclude metastatic disease. Electronically Signed   By: Marijo Sanes M.D.   On: 04/15/2019 13:50   DG Chest Port  1 View  Result Date: 04/15/2019 CLINICAL DATA:  Altered mental status. EXAM: PORTABLE CHEST 1 VIEW COMPARISON:  March 29, 2019 FINDINGS: Cardiomediastinal silhouette is enlarged. Mediastinal contours appear intact. There is no evidence of pleural effusion or pneumothorax. Bilateral lower lobe atelectasis versus mild peribronchial airspace consolidation. Osseous structures are without acute abnormality. Soft tissues are grossly normal. IMPRESSION: 1. Bilateral lower lobe atelectasis versus mild peribronchial airspace consolidation. 2. Enlarged cardiac silhouette. Electronically Signed   By: Fidela Salisbury M.D.   On: 04/15/2019 11:40   ECHOCARDIOGRAM COMPLETE  Result Date: 04/17/2019   ECHOCARDIOGRAM REPORT   Patient Name:   MELISSIA SALAIZ Date of Exam: 04/17/2019 Medical Rec #:  [id number]    Height:       64.5 in Accession #:    [id number]   Weight:       155.0 lb Date of Birth:  [birth year]    BSA:          1.76 m Patient Age:    33 years     BP:           148/67 mmHg Patient Gender: F            HR:           63 bpm. Exam Location:  Inpatient Procedure: 2D Echo Indications:    Elevated troponin  History:        Patient has prior history of Echocardiogram examinations, most                 recent 03/09/2019. Arrythmias:Atrial Fibrillation. CKD. CAD. H/o                 stroke.  Sonographer:    Clayton Lefort RDCS (AE) Referring Phys: [id number] BELKYS A REGALADO IMPRESSIONS  1. Left ventricular ejection fraction, by visual estimation, is 60 to 65%. The left ventricle has normal function. There is mildly increased left ventricular hypertrophy.  2. Left ventricular diastolic parameters are consistent with Grade II diastolic dysfunction (pseudonormalization). The mean left atrial pressure is high.  3. The left ventricle has no regional wall motion abnormalities.  4. Global right ventricle has normal systolic function.The right ventricular size is normal. No increase in right ventricular wall thickness.  5. Left atrial  size was mildly dilated.  6. Right atrial size was mildly dilated.  7. Mild mitral annular calcification.  8. The mitral valve is normal in structure. Trivial mitral valve regurgitation.  9. The tricuspid valve is normal  in structure. 10. The tricuspid valve is normal in structure. Tricuspid valve regurgitation is mild-moderate. 11. The aortic valve is normal in structure. Aortic valve regurgitation is trivial. 12. The pulmonic valve was not well visualized. Pulmonic valve regurgitation is not visualized. 13. Mildly elevated pulmonary artery systolic pressure. 14. The tricuspid regurgitant velocity is 2.18 m/s, and with an assumed right atrial pressure of 15 mmHg, the estimated right ventricular systolic pressure is mildly elevated at 34.0 mmHg. 15. The inferior vena cava is dilated in size with <50% respiratory variability, suggesting right atrial pressure of 15 mmHg. 16. There is exaggerated respiratory displacement of the interventricular septum. This could represent contrictive physiology , but can also be seen with increased work of breathing (e.g. COPD or asthma exacerbation). 2. Compared to 03/09/2019, left ventricular wall motion and overall systolic function have improved. FINDINGS  Left Ventricle: Left ventricular ejection fraction, by visual estimation, is 60 to 65%. The left ventricle has normal function. The left ventricle has no regional wall motion abnormalities. The left ventricular internal cavity size was the left ventricle is normal in size. There is mildly increased left ventricular hypertrophy. Concentric left ventricular hypertrophy. Left ventricular diastolic parameters are consistent with Grade II diastolic dysfunction (pseudonormalization). Right Ventricle: The right ventricular size is normal. No increase in right ventricular wall thickness. Global RV systolic function is has normal systolic function. The tricuspid regurgitant velocity is 2.18 m/s, and with an assumed right atrial pressure   of 15 mmHg, the estimated right ventricular systolic pressure is mildly elevated at 34.0 mmHg. Left Atrium: Left atrial size was mildly dilated. Right Atrium: Right atrial size was mildly dilated Pericardium: There is no evidence of pericardial effusion. Mitral Valve: The mitral valve is normal in structure. Mild mitral annular calcification. Trivial mitral valve regurgitation. MV peak gradient, 4.4 mmHg. Tricuspid Valve: The tricuspid valve is normal in structure. Tricuspid valve regurgitation is mild-moderate. Aortic Valve: The aortic valve is normal in structure. Aortic valve regurgitation is trivial. Aortic regurgitation PHT measures 506 msec. Aortic valve mean gradient measures 3.0 mmHg. Aortic valve peak gradient measures 4.0 mmHg. Aortic valve area, by VTI measures 2.14 cm. Pulmonic Valve: The pulmonic valve was not well visualized. Pulmonic valve regurgitation is not visualized. Pulmonic regurgitation is not visualized. Aorta: The aortic root and ascending aorta are structurally normal, with no evidence of dilitation. Venous: The inferior vena cava is dilated in size with less than 50% respiratory variability, suggesting right atrial pressure of 15 mmHg. IAS/Shunts: No atrial level shunt detected by color flow Doppler.  LEFT VENTRICLE PLAX 2D LVIDd:         4.40 cm LVIDs:         3.40 cm LV PW:         1.30 cm LV IVS:        1.30 cm LVOT diam:     2.00 cm LV SV:         40 ml LV SV Index:   22.32 LVOT Area:     3.14 cm  RIGHT VENTRICLE            IVC RV Basal diam:  3.00 cm    IVC diam: 2.70 cm RV S prime:     9.46 cm/s TAPSE (M-mode): 2.4 cm LEFT ATRIUM             Index       RIGHT ATRIUM           Index LA diam:  4.50 cm 2.55 cm/m  RA Area:     19.80 cm LA Vol (A2C):   75.8 ml 42.95 ml/m RA Volume:   50.70 ml  28.73 ml/m LA Vol (A4C):   56.3 ml 31.90 ml/m LA Biplane Vol: 65.9 ml 37.34 ml/m  AORTIC VALVE AV Area (Vmax):    2.30 cm AV Area (Vmean):   2.20 cm AV Area (VTI):     2.14 cm AV  Vmax:           99.70 cm/s AV Vmean:          78.600 cm/s AV VTI:            0.267 m AV Peak Grad:      4.0 mmHg AV Mean Grad:      3.0 mmHg LVOT Vmax:         73.10 cm/s LVOT Vmean:        55.100 cm/s LVOT VTI:          0.182 m LVOT/AV VTI ratio: 0.68 AI PHT:            506 msec  AORTA Ao Root diam: 3.00 cm Ao Asc diam:  3.30 cm MITRAL VALVE              TRICUSPID VALVE MV Area (PHT): 2.44 cm   TR Peak grad:   19.0 mmHg MV Peak grad:  4.4 mmHg   TR Vmax:        239.00 cm/s MV Mean grad:  1.0 mmHg MV Vmax:       1.05 m/s   SHUNTS MV Vmean:      50.4 cm/s  Systemic VTI:  0.18 m MV VTI:        0.31 m     Systemic Diam: 2.00 cm MV PHT:        90.00 msec  Mihai Croitoru MD Electronically signed by Sanda Klein MD Signature Date/Time: 04/17/2019/10:09:57 PM    Final    DG Hip Unilat W or Wo Pelvis 2-3 Views Right  Result Date: 04/13/2019 CLINICAL DATA:  Fall with right-sided hip pain EXAM: DG HIP (WITH OR WITHOUT PELVIS) 2-3V RIGHT COMPARISON:  08/17/2018, CT 03/27/2019 FINDINGS: SI joints are non widened. CT described sacral insufficiency fractures are not well seen on the radiograph. Pubic symphysis and rami appear intact. Subacute to chronic avulsion fracture off the left ischial tuberosity again noted. Status post right hip replacement with intact hardware and normal alignment. No acute fracture is seen. IMPRESSION: 1. Status post right hip replacement without acute osseous abnormality. 2. Subacute to chronic avulsion fracture off the left ischial tuberosity as before. Electronically Signed   By: Donavan Foil M.D.   On: 04/13/2019 17:57   US Abdomen Limited RUQ  Result Date: 04/15/2019 CLINICAL DATA:  Elevated liver function tests. EXAM: ULTRASOUND ABDOMEN LIMITED RIGHT UPPER QUADRANT COMPARISON:  CT of the abdomen April 15, 2019 FINDINGS: Gallbladder: Post cholecystectomy.  No focal fluid collection. Common bile duct: Diameter: 6.6 mm Liver: No focal lesion identified. Within normal limits in  parenchymal echogenicity. Portal vein is patent on color Doppler imaging with normal direction of blood flow towards the liver. Other: None. IMPRESSION: Normal right upper quadrant ultrasound post cholecystectomy. Electronically Signed   By: Fidela Salisbury M.D.   On: 04/15/2019 16:00    Labs:  CBC: Recent Labs    04/17/19 0759 04/18/19 0241 04/19/19 0404 04/20/19 0227  WBC 15.6* 12.8* 8.6 10.9*  HGB 9.3* 8.6* 8.7* 9.1*  HCT  29.5* 27.4* 28.0* 29.4*  PLT 150 161 191 210    COAGS: Recent Labs    03/06/19 0626 03/07/19 1330 04/15/19 1052 04/19/19 0404  INR 1.3* 2.0* 1.3*  --   APTT 30  --  34 58*    BMP: Recent Labs    04/16/19 0706 04/17/19 0759 04/18/19 0241 04/19/19 0404  NA 142 143 140 138  K 4.2 4.4 4.3 4.0  CL 109 109 108 109  CO2 21* 24 21* 20*  GLUCOSE 125* 126* 135* 125*  BUN 14 19 28* 30*  CALCIUM 8.0* 8.8* 8.6* 8.7*  CREATININE 1.06* 0.94 1.07* 1.27*  GFRNONAA 49* 56* 48* 39*  GFRAA 57* >60 56* 46*    LIVER FUNCTION TESTS: Recent Labs    03/06/19 0625 03/10/19 0452 04/15/19 1052 04/16/19 0706  BILITOT 1.5* 0.8 2.5* 2.1*  AST 44* 33 46* 29  ALT 47* 100* 36 26  ALKPHOS 114 98 101 79  PROT 6.5 5.1* 6.3* 5.3*  ALBUMIN 3.3* 2.2* 3.3* 2.4*    TUMOR MARKERS: No results for input(s): AFPTM, CEA, CA199, CHROMGRNA in the last 8760 hours.  Assessment and Plan:  Long Hx low back issues--previous surgeries with Dr Betty Jackson 2015 and 2017 Low back pain now in low back to Bilat buttock Incidental sacral insufficiency fracture healing well  Multilevel bilateral foraminal stenosis most severe on the right at L5-S1. Not surgical candidate per Ortho surgery secondary osteoporosis Scheduled now Lumbar epidural steroid injection Pt is aware of procedure benefits and risks including but not limited to Infection; bleeding; damage to surrounding structures LD Eliquis 1/19 Hep off 650 am today Consent signed andin chart  Thank you for this interesting  consult.  I greatly enjoyed meeting Betty Jackson and look forward to participating in their care.  A copy of this report was sent to the requesting provider on this date.  Electronically Signed: Lavonia Drafts, PA-C 04/20/2019, 7:02 AM   I spent a total of 20 Minutes    in face to face in clinical consultation, greater than 50% of which was counseling/coordinating care for lumbar epidural steroid injection

## 2019-04-20 NOTE — Progress Notes (Signed)
Pharmacy Antibiotic Note  Betty Jackson is a 85 y.o. female  [place]. Pharmacy has been consulted for Cefepime dosing. Plans noted to discontinue antibiotics today  -WBC= 10.9, SCr= 1.27, CrCl ~ 30  Plan.  -No cefepime dose changes needed -Anticipate d/c antibiotics today  Height: 5' 4.5" (163.8 cm) Weight: 155 lb (70.3 kg) IBW/kg (Calculated) : 55.85  Temp (24hrs), Avg:98.1 F (36.7 C), Min:97.4 F (36.3 C), Max:98.6 F (37 C)  Recent Labs  Lab 04/15/19 1052 04/15/19 1148 04/15/19 1332 04/15/19 1840 04/16/19 0346 04/16/19 0706 04/17/19 0759 04/18/19 0241 04/19/19 0404 04/20/19 0227  WBC 15.9*  --   --   --    < > 15.5* 15.6* 12.8* 8.6 10.9*  CREATININE 1.01*   < >  --  1.03*  --  1.06* 0.94 1.07* 1.27*  --   LATICACIDVEN 2.4*  --  2.6*  --   --   --   --   --   --   --    < > = values in this interval not displayed.    Estimated Creatinine Clearance: 33.3 mL/min (A) (by C-G formula based on SCr of 1.27 mg/dL (H)).    Allergies  Allergen Reactions  . Dextromethorphan Rash    Antimicrobials this admission: 1/16 Cefepime >>  1/16 Vancomycin >>1/18  Microbiology results: 1/16 BCx: ngtd 1/16 Urine: ng    Thank you for allowing pharmacy to be a part of this patient's care.   Hildred Laser, PharmD Clinical Pharmacist **Pharmacist phone directory can now be found on [email or web address] ([email or web address]).  Listed under [place].

## 2019-04-20 NOTE — Evaluation (Signed)
Clinical/Bedside Swallow Evaluation Patient Details  Name: Betty Jackson MRN: [id number] Date of Birth: 1934-10-02  Today's Date: 04/20/2019 Time: SLP Start Time (ACUTE ONLY): 1125 SLP Stop Time (ACUTE ONLY): 1144 SLP Time Calculation (min) (ACUTE ONLY): 19 min  Past Medical History:  Past Medical History:  Diagnosis Date  . Abdominal pain, epigastric 06/22/2013  . Abdominal pain, other specified site 12/24/2011  . Abnormal urinalysis 05/20/2017  . Acute respiratory failure with hypoxia ([place]) 05/20/2017  . Anemia, unspecified 10/28/2012  . Anxiety state, unspecified 10/28/2012  . Bruises easily    d/t being on prednisone and plavix  . CAD (coronary artery disease)    a. Stent RCA in [facility];  b. Cath approx 2009 - nonobs per pt report.  . Cataract    immature on the left eye  . Chronic insomnia 02/07/2013  . Chronic lower back pain    scoliosis  . CKD (chronic kidney disease) stage 3, GFR 30-59 ml/min   . Complication of anesthesia    pt has a very high tolerance to meds  . Coronary disease 05/20/2017  . CVA (cerebral infarction) 10/29/2012  . DDD (degenerative disc disease)   . Depression   . Diverticulitis    hx of  . Diverticulosis   . Elevated transaminase level 02/07/2013  . Enteritis   . GERD (gastroesophageal reflux disease) 09/01/2010  . Giant cell arteritis ([place])   . Hemorrhoids   . Herniated nucleus pulposus, L5-S1, right 11/04/2015  . History of scabies   . HTN (hypertension) 05/20/2017  . Hyperlipidemia    takes Lipitor daily  . Hypertension    takes Amlodipine,Losartan,Metoprolol,and HCTZ daily  . Incontinence of urine   . Influenza A 05/20/2017  . Insomnia    takes Restoril nightly  . Lumbar stenosis 04/25/2013  . Major depressive disorder, recurrent episode, moderate ([place]) 07/16/2013  . Migraines    "back in my 20's; none since" (10/28/2012)  . Osteoporosis   . PAF (paroxysmal atrial fibrillation) ([place]) 2011   a. lone epidode in 2011 according to notes.  .  Rheumatoid arthritis ([place])   . Scoliosis   . Sepsis ([place]) 05/20/2017  . Sinus bradycardia    a. on chronic bb  . Temporal arteritis ([place]) 2011   a. followed @ [facility]; potential flareup 10/28/2012/notes 10/28/2012  . Vocal cord dysfunction    "they don't operate properly" (10/28/2012)   Past Surgical History:  Past Surgical History:  Procedure Laterality Date  . ABDOMINAL HYSTERECTOMY  ~ 1984   vaginally  . BACK SURGERY  7-86yrs ago   X Stop  . BLADDER SUSPENSION  2001  . BREAST BIOPSY Right   . CATARACT EXTRACTION W/ INTRAOCULAR LENS IMPLANT Right ~ 08/2012  . CHEST TUBE INSERTION Left 03/08/2019   Procedure: Chest Tube Insertion;  Surgeon: Ivin Poot, MD;  Location: [place];  Service: Thoracic;  Laterality: Left;  . COLONOSCOPY  01/26/2012   Procedure: COLONOSCOPY;  Surgeon: Ladene Artist, MD,FACG;  Location[facility] ENDOSCOPY;  Service: Endoscopy;  Laterality: N/A;  note the EGD is possible  . CORONARY ANGIOPLASTY WITH STENT PLACEMENT  2006   X 1 stent  . EPIDURAL BLOCK INJECTION    . ESOPHAGOGASTRODUODENOSCOPY  01/26/2012   Procedure: ESOPHAGOGASTRODUODENOSCOPY (EGD);  Surgeon: Ladene Artist, MD,FACG;  Location: [facility] ENDOSCOPY;  Service: Endoscopy;  Laterality: N/A;  . HEMIARTHROPLASTY HIP Right 2012  . LAPAROSCOPIC CHOLECYSTECTOMY  2001  . LUMBAR FUSION  03/2013  . LUMBAR LAMINECTOMY/DECOMPRESSION MICRODISCECTOMY Right 11/04/2015  Procedure: Right Lumbar Five-Sacral One Microdiskectomy;  Surgeon: Kristeen Miss, MD;  Location: [place] NEURO ORS;  Service: Neurosurgery;  Laterality: Right;  Right L5-S1 Microdiskectomy  . moles removed that required stiches     one on leg and one on face  . SUBXYPHOID PERICARDIAL WINDOW N/A 03/08/2019   Procedure: SUBXYPHOID PERICARDIAL WINDOW;  Surgeon: Ivin Poot, MD;  Location: [place];  Service: Thoracic;  Laterality: N/A;  . TEE WITHOUT CARDIOVERSION N/A 03/08/2019   Procedure: TRANSESOPHAGEAL ECHOCARDIOGRAM (TEE);  Surgeon: Prescott Gum, Collier Salina, MD;   Location: [place];  Service: Thoracic;  Laterality: N/A;  . TEMPORAL ARTERY BIOPSY / LIGATION Bilateral 2011  . TONSILLECTOMY AND ADENOIDECTOMY     at age 61  . TUBAL LIGATION  ~ 1982  . X-STOP IMPLANTATION  ~ 2010   "lower back" (10/28/2012)   HPI:  85 year old with past medical history significant for CKD stage III, CAD, stroke, temporal arteritis, RA on chronic steroids, status post L2-3, L3-L4 posterior fusion by Dr. Ellene Route in 2015 and L5-S1 microdiscectomy by Dr. Ellene Route  in 2017.  patient daughter noted that patient has been limping around, patient complaining of worsening back pain.  Patient was found to be lethargic and confused by daughter the morning of admission.  Patient has been vomited in the bed. Evaluation in the ED CT abdomen and pelvis revealed a prior  small nondisplaced fracture involving the left ischium now appears to be an aggressive lytic process with marked periostitis.  There is also marked lucency of L2, L3 and L4 vertebral bodies and possible T12.    Assessment / Plan / Recommendation Clinical Impression  SLP reconsulted due to choking episode during dinner Wednesday 04/19/19. RN reports pt has not eaten much today, but did not exhibit difficulty with multiple po medications given with liquid. Pt does not recall what she got choked on, but that she was talking with visitors (on the phone? staff?) and wasn't paying attention. No overt s/s aspiration observed on any textures given at this time. Pt was encouraged to minimize distractions (TV, talking) during meals and focus on small bites/sips at slow rate. Continue current diet. Please reconsult if needs arise. RN informed.   SLP Visit Diagnosis: Dysphagia, unspecified (R13.10)    Aspiration Risk  Mild aspiration risk    Diet Recommendation Regular;Thin liquid   Liquid Administration via: Cup;Straw Medication Administration: Whole meds with liquid Supervision: Patient able to self feed;Intermittent supervision to cue for  compensatory strategies Compensations: Minimize environmental distractions;Slow rate;Small sips/bites Postural Changes: Seated upright at 90 degrees;Remain upright for at least 30 minutes after po intake    Other  Recommendations Oral Care Recommendations: Oral care BID   Follow up Recommendations 24 hour supervision/assistance          Prognosis   fair     Swallow Study   General Date of Onset: 04/15/19 HPI: 85 year old with past medical history significant for CKD stage III, CAD, stroke, temporal arteritis, RA on chronic steroids, status post L2-3, L3-L4 posterior fusion by Dr. Ellene Route in 2015 and L5-S1 microdiscectomy by Dr. Ellene Route  in 2017.  patient daughter noted that patient has been limping around, patient complaining of worsening back pain.  Patient was found to be lethargic and confused by daughter the morning of admission.  Patient has been vomited in the bed. Evaluation in the ED CT abdomen and pelvis revealed a prior  small nondisplaced fracture involving the left ischium now appears to be an aggressive lytic process with marked periostitis.  There is also marked lucency of L2, L3 and L4 vertebral bodies and possible T12.  Type of Study: Bedside Swallow Evaluation Previous Swallow Assessment: BSE 04/17/19 Diet Prior to this Study: Regular;Thin liquids Temperature Spikes Noted: No Respiratory Status: Room air History of Recent Intubation: No Behavior/Cognition: Cooperative;Pleasant mood;Alert Oral Cavity Assessment: Within Functional Limits Oral Care Completed by SLP: No Oral Cavity - Dentition: Adequate natural dentition Vision: Functional for self-feeding Self-Feeding Abilities: Able to feed self Patient Positioning: Upright in bed Baseline Vocal Quality: Low vocal intensity Volitional Cough: Weak Volitional Swallow: Able to elicit    Oral/Motor/Sensory Function Overall Oral Motor/Sensory Function: Within functional limits   Ice Chips Ice chips: Not tested   Thin  Liquid Thin Liquid: Within functional limits Presentation: Straw    Nectar Thick Nectar Thick Liquid: Not tested   Honey Thick Honey Thick Liquid: Not tested   Puree Puree: Within functional limits Presentation: Self Fed;Spoon   Solid     Solid: Within functional limits Presentation: [facility], [facility], [place] Speech Language Pathologist Office: [phone] Pager: [phone]   Shonna Chock 04/20/2019,11:56 AM

## 2019-04-20 NOTE — Progress Notes (Signed)
Progress Note  Patient Name: Betty Jackson Date of Encounter: 04/20/2019  Primary Cardiologist: [name], MD   Subjective   Denies any chest pain or dyspnea.  Underwent epidural injection today.  Inpatient Medications    Scheduled Meds: . vitamin C  1,000 mg Oral Daily  . cholecalciferol  1,000 Units Oral Daily  . diltiazem  60 mg Oral TID  . escitalopram  10 mg Oral Daily  . ferrous sulfate  325 mg Oral BID WC  . iopamidol      . metoprolol tartrate  100 mg Oral BID  . pantoprazole  40 mg Oral Daily  . predniSONE  30 mg Oral Q breakfast  . rosuvastatin  10 mg Oral QHS  . vitamin E  400 Units Oral Daily   Continuous Infusions: . azithromycin 500 mg (04/20/19 1012)  . ceFEPime (MAXIPIME) IV 2 g (04/20/19 0330)  . heparin Stopped (04/20/19 0658)   PRN Meds: acetaminophen, albuterol, dicyclomine, HYDROcodone-acetaminophen, HYDROmorphone (DILAUDID) injection, ondansetron (ZOFRAN) IV, phenol   Vital Signs    Vitals:   04/19/19 2007 04/19/19 2310 04/20/19 0400 04/20/19 1000  BP: (!) 143/91 (!) 163/79 (!) 158/73 (!) 162/85  Pulse: (!) 103 60 (!) 55 66  Resp: 20 20 13 13   Temp: (!) 97.4 F (36.3 C) 97.6 F (36.4 C) 98 F (36.7 C) 98.6 F (37 C)  TempSrc: Oral Oral Oral Oral  SpO2: 97% 97% 95% 93%  Weight:      Height:        Intake/Output Summary (Last 24 hours) at 04/20/2019 1103 Last data filed at 04/20/2019 1001 Gross per 24 hour  Intake 1311.12 ml  Output 800 ml  Net 511.12 ml   Last 3 Weights 04/15/2019 03/29/2019 03/13/2019  Weight (lbs) 155 lb 155 lb 169 lb 9.6 oz  Weight (kg) 70.308 kg 70.308 kg 76.93 kg      Telemetry    Sinus rhythm, rate 50-60s.  Had 4 minute run of elevated HR (110 bpm) this morning.  Regular narrow complex, ?SVT - Personally Reviewed  ECG    No new ECG - Personally Reviewed  Physical Exam   GEN: No acute distress.   Neck: No JVD Cardiac: RRR, no murmurs, rubs, or gallops.  Respiratory: Clear to auscultation  bilaterally. GI: Soft, nontender, non-distended  MS: No edema; No deformity. Neuro:  Nonfocal  Psych: Normal affect   Labs    High Sensitivity Troponin:   Recent Labs  Lab 04/15/19 1052 04/15/19 1331 04/15/19 1840 04/16/19 0706  TROPONINIHS 88* 174* 312* 168*      Chemistry Recent Labs  Lab 04/15/19 1052 04/15/19 1148 04/16/19 0706 04/16/19 0706 04/17/19 0759 04/18/19 0241 04/19/19 0404  NA 140   < > 142   < > 143 140 138  K 2.7*   < > 4.2   < > 4.4 4.3 4.0  CL 96*   < > 109   < > 109 108 109  CO2 29   < > 21*   < > 24 21* 20*  GLUCOSE 136*   < > 125*   < > 126* 135* 125*  BUN 11   < > 14   < > 19 28* 30*  CREATININE 1.01*   < > 1.06*   < > 0.94 1.07* 1.27*  CALCIUM 8.9   < > 8.0*   < > 8.8* 8.6* 8.7*  PROT 6.3*  --  5.3*  --   --   --   --  ALBUMIN 3.3*  --  2.4*  --   --   --   --   AST 46*  --  29  --   --   --   --   ALT 36  --  26  --   --   --   --   ALKPHOS 101  --  79  --   --   --   --   BILITOT 2.5*  --  2.1*  --   --   --   --   GFRNONAA 52*   < > 49*   < > 56* 48* 39*  GFRAA >60   < > 57*   < > >60 56* 46*  ANIONGAP 15   < > 12   < > 10 11 9    < > = values in this interval not displayed.     Hematology Recent Labs  Lab 04/18/19 0241 04/19/19 0404 04/20/19 0227  WBC 12.8* 8.6 10.9*  RBC 2.98* 2.99* 3.15*  HGB 8.6* 8.7* 9.1*  HCT 27.4* 28.0* 29.4*  MCV 91.9 93.6 93.3  MCH 28.9 29.1 28.9  MCHC 31.4 31.1 31.0  RDW 15.1 15.0 15.2  PLT 161 191 210    BNP Recent Labs  Lab 04/15/19 1052 04/18/19 0241  BNP 170.2* 930.1*     DDimer No results for input(s): DDIMER in the last 168 hours.   Radiology    No results found.  Cardiac Studies   Echo 04/17/2019 1. Left ventricular ejection fraction, by visual estimation, is 60 to 65%. The left ventricle has normal function. There is mildly increased left ventricular hypertrophy. 2. Left ventricular diastolic parameters are consistent with Grade II diastolic dysfunction  (pseudonormalization). The mean left atrial pressure is high. 3. The left ventricle has no regional wall motion abnormalities. 4. Global right ventricle has normal systolic function.The right ventricular size is normal. No increase in right ventricular wall thickness. 5. Left atrial size was mildly dilated. 6. Right atrial size was mildly dilated. 7. Mild mitral annular calcification. 8. The mitral valve is normal in structure. Trivial mitral valve regurgitation. 9. The tricuspid valve is normal in structure. 10. The tricuspid valve is normal in structure. Tricuspid valve regurgitation is mild-moderate. 11. The aortic valve is normal in structure. Aortic valve regurgitation is trivial. 12. The pulmonic valve was not well visualized. Pulmonic valve regurgitation is not visualized. 13. Mildly elevated pulmonary artery systolic pressure. 14. The tricuspid regurgitant velocity is 2.18 m/s, and with an assumed right atrial pressure of 15 mmHg, the estimated right ventricular systolic pressure is mildly elevated at 34.0 mmHg. 15. The inferior vena cava is dilated in size with <50% respiratory variability, suggesting right atrial pressure of 15 mmHg. 16. There is exaggerated respiratory displacement of the interventricular septum. This could represent contrictive physiology , but can also be seen with increased work of breathing (e.g. COPD or asthma exacerbation). 49. Compared to 03/09/2019, left ventricular wall motion and overall systolic function have improved.  Patient Profile     85 y.o. female with a hx of PAF, CAD, HTN, HLD, and temporal arteritis who is being seen today for the evaluation of elevated troponin and abnormal CT at the request of Dr. Tyrell Antonio.  Assessment & Plan    Elevated troponin: hsTn peaked 312, occurred in the setting of altered mental status with possible sepsis and elevated lactic acid level.  The only chest pain she has been having is incision line soreness from  the subxiphoid  window surgery recently.  Although she had mild drop in ejection fraction of her subxiphoid window placement, however repeat echocardiogram during this admission showed EF has normalized.  EKG did show new diffuse T wave inversion.   -Given lack of chest pain and no wall motion abnormalities on echo, no further evaluation recommended as inpatient.  Can follow-up as outpatient and consider ischemia evaluation once recovers from acute illness  Abnormal CT chest: Mosaic pattern of groundglass attenuation noted which could represent small airway disease versus pneumonitis.  Patient was started on amiodarone therapy to help suppress atrial fibrillation with RVR after subxiphoid window in December 2020.  This was supposed to be of short course of therapy.  Based on the last office note by Dr. Prescott Gum, he planned to discontinue amiodarone -Discontinue amiodarone  PAF: Maintaining sinus rhythm.  CHADS2-VASc score of5(HTN, AGE x2, CAD, female; there is also a questionable history of CVA, which would increase her score to 7).  Given high CHADS-VASc, agree with bridging her with heparin gtt while holding Eliquis prior to epidural injection, would restart Eliquis as soon as able after procedure  Left ischium fracture: Nondisplaced, found incidentally on CT of abdomen and pelvis.  Patient is currently being evaluated by orthopedic surgery.  According to orthopedic surgery, she would not be a good surgical candidate given history of severe osteoporosis, planning epidural steroid injection for her pain.  CAD: Last cardiac catheterization was in 2014 which showed patent RCA stent and mild disease in left circumflex and LAD.  [facility] will sign off.   Medication Recommendations:  Continue home cardiac meds, no changes except stopped amiodarone Other recommendations (labs, testing, etc):  None Follow up as an outpatient:  Appointment with Dr Angelena Form scheduled for 05/25/19   For  questions or updates, please contact [facility] Please consult [email or web address] for contact info under        Signed, Donato Heinz, MD  04/20/2019, 11:03 AM

## 2019-04-21 DIAGNOSIS — M5136 Other intervertebral disc degeneration, lumbar region: Secondary | ICD-10-CM

## 2019-04-21 LAB — ACID FAST CULTURE WITH REFLEXED SENSITIVITIES (MYCOBACTERIA)
Acid Fast Culture: NEGATIVE
Acid Fast Culture: NEGATIVE

## 2019-04-21 LAB — CBC
HCT: 30.7 % — ABNORMAL LOW (ref 36.0–46.0)
Hemoglobin: 9.7 g/dL — ABNORMAL LOW (ref 12.0–15.0)
MCH: 28.6 pg (ref 26.0–34.0)
MCHC: 31.6 g/dL (ref 30.0–36.0)
MCV: 90.6 fL (ref 80.0–100.0)
Platelets: 272 10*3/uL (ref 150–400)
RBC: 3.39 MIL/uL — ABNORMAL LOW (ref 3.87–5.11)
RDW: 14.9 % (ref 11.5–15.5)
WBC: 11 10*3/uL — ABNORMAL HIGH (ref 4.0–10.5)
nRBC: 0.2 % (ref 0.0–0.2)

## 2019-04-21 MED ORDER — FUROSEMIDE 20 MG PO TABS: 40.0000 mg | ORAL_TABLET | Freq: Every day | ORAL | 0 refills | Status: DC

## 2019-04-21 MED ORDER — FUROSEMIDE 40 MG PO TABS: 40.0000 mg | ORAL_TABLET | Freq: Two times a day (BID) | ORAL | Status: DC

## 2019-04-21 MED ORDER — POTASSIUM CHLORIDE CRYS ER 10 MEQ PO TBCR: 10.0000 meq | EXTENDED_RELEASE_TABLET | Freq: Every day | ORAL | 2 refills | Status: DC

## 2019-04-21 MED ORDER — FERROUS SULFATE 325 (65 FE) MG PO TABS: 325.0000 mg | ORAL_TABLET | Freq: Two times a day (BID) | ORAL | 3 refills | Status: DC

## 2019-04-21 MED ORDER — PREDNISONE 10 MG PO TABS: 10.0000 mg | ORAL_TABLET | Freq: Every day | ORAL | Status: DC

## 2019-04-21 MED ORDER — FUROSEMIDE 40 MG PO TABS
40.0000 mg | ORAL_TABLET | Freq: Two times a day (BID) | ORAL | Status: DC
  Administered 2019-04-21: 12:00:00 40 mg via ORAL
  Filled 2019-04-21: qty 1

## 2019-04-21 NOTE — Progress Notes (Signed)
Pt being discharged home with family. IVs removed. Telemetry box removed. Pt received discharge instructions and all questions were answered. Belongings packed. Pt's daughter [name] was notified and is on her way.

## 2019-04-21 NOTE — Progress Notes (Signed)
Occupational Therapy Treatment Patient Details Name: JILLIANNA JAKUBCZAK MRN: [id number] DOB: Aug 03, 1934 Today's Date: 04/21/2019    History of present illness Pt is an 85 y.o. female admitted 04/15/19 with AMS and c/o back/hip pain. CT revealed her small prior nondiscplaced L ischial fx (from previous fall) which now appears to be an aggressive lytic process with marked periostitis; marked lucency of L2-L4 vertebral bodies and possibly T12; no malignancies noted. Other PMH includes RA, afib, CAD, scoliosis, DDD, CVA, CKD.   OT comments  Patient semi-supine in bed upon arrival, agreeable to transferring to chair for breakfast. Patient require min guard assist for bed mobility for safety and min A for functional transfers due to decreased safety with body mechanics and balance. Patient received epidural injection yesterday, however reports she doesn't feel much change. Recommend continued acute OT services to progress patient independence and safety with transfers, self care.   Follow Up Recommendations  No OT follow up;Supervision/Assistance - 24 hour;Other (comment)    Equipment Recommendations  None recommended by OT       Precautions / Restrictions Precautions Precautions: Fall Precaution Comments: Back precautions for comfort Restrictions Weight Bearing Restrictions: No LLE Weight Bearing: Weight bearing as tolerated       Mobility Bed Mobility Overal bed mobility: Needs Assistance Bed Mobility: Rolling;Supine to Sit Rolling: Min guard   Supine to sit: Min guard     General bed mobility comments: min guard for safety  Transfers Overall transfer level: Needs assistance Equipment used: Rolling walker (2 wheeled) Transfers: Sit to/from Stand Sit to Stand: Min assist         General transfer comment: min A to boost to upright and for steadying assist with max cues for hand placement not to pull on walker with limited carry over    Balance Overall balance assessment: Needs  assistance Sitting-balance support: Feet supported;No upper extremity supported Sitting balance-[name]: Good     Standing balance support: Bilateral upper extremity supported;During functional activity Standing balance-[name]: Poor Standing balance comment: Reliant on UE support                           ADL either performed or assessed with clinical judgement   ADL Overall ADL's : Needs assistance/impaired Eating/Feeding: Independent;Sitting                       Toilet Transfer: Minimal assistance;BSC;RW;Ambulation;Cueing for safety Toilet Transfer Details (indicate cue type and reason): simulated to recliner; min A for safety with balance, min cues for body mechanics         Functional mobility during ADLs: Minimal assistance;Cueing for safety;Rolling walker                 Cognition Arousal/Alertness: Awake/alert Behavior During Therapy: WFL for tasks assessed/performed Overall Cognitive Status: Within Functional Limits for tasks assessed                                                General Comments 97% on RA with activity    Pertinent Vitals/ Pain       Pain Assessment: Faces Faces Pain Scale: Hurts little more Pain Location: back with movement Pain Descriptors / Indicators: Aching;Discomfort;Grimacing Pain Intervention(s): Limited activity within patient's tolerance         Frequency  Min  2X/week        Progress Toward Goals  OT Goals(current goals can now be found in the care plan section)  Progress towards OT goals: Progressing toward goals  Acute Rehab OT Goals Patient Stated Goal: Return home with family assist, continue with HHPT OT Goal Formulation: With patient Time For Goal Achievement: 05/02/19 Potential to Achieve Goals: Good ADL Goals Pt Will Perform Grooming: with modified independence;sitting;standing Pt Will Perform Upper Body Dressing: with modified independence;sitting Pt Will  Perform Lower Body Dressing: with min assist;sitting/lateral leans;sit to/from stand Pt Will Transfer to Toilet: with supervision;ambulating;bedside commode Pt Will Perform Toileting - Clothing Manipulation and hygiene: with supervision;sit to/from stand;sitting/lateral leans Pt Will Perform Tub/Shower Transfer: Shower transfer;with supervision;ambulating;shower seat;rolling walker;grab bars  Plan Discharge plan remains appropriate       AM-PAC OT "6 Clicks" Daily Activity     Outcome Measure   Help from another person eating meals?: None Help from another person taking care of personal grooming?: A Little Help from another person toileting, which includes using toliet, bedpan, or urinal?: A Little Help from another person bathing (including washing, rinsing, drying)?: A Lot Help from another person to put on and taking off regular upper body clothing?: A Little Help from another person to put on and taking off regular lower body clothing?: A Lot 6 Click Score: 17    End of Session Equipment Utilized During Treatment: Rolling walker  OT Visit Diagnosis: Unsteadiness on feet (R26.81);Muscle weakness (generalized) (M62.81);Other abnormalities of gait and mobility (R26.89);History of falling (Z91.81);Pain Pain - part of body: Hip(Back)   Activity Tolerance Patient tolerated treatment well   Patient Left in chair;with call bell/phone within reach;with chair alarm set   Nurse Communication Mobility status        Time: [id number] OT Time Calculation (min): 15 min  Charges: OT General Charges $OT Visit: 1 Visit OT Treatments $Self Care/Home Management : 8-22 mins  Shon Millet OT OT office: [place] 04/21/2019, 11:14 AM

## 2019-04-21 NOTE — TOC Transition Note (Signed)
Transition of Care [facility]) - CM/SW Discharge Note Marvetta Gibbons RN, BSN Transitions of Care Unit 4E- RN Case Manager [phone]   Patient Details  Name: Betty Jackson MRN: [id number] Date of Birth: [birth year]  Transition of Care [facility] CM/SW Contact:  Dawayne Patricia, RN Phone Number: 04/21/2019, 12:26 PM   Clinical Narrative:    Pt stable for transition home today, PTA pt was active with [name] for HHPT- will resume [place] services with [name]- notified Tommi Rumps with [facility] for resumption of [place] needs- order placed for HHPT.    Final next level of care: [place] Barriers to Discharge: No Barriers Identified   Patient Goals and CMS Choice Patient states their goals for this hospitalization and ongoing recovery are:: return home with [facility]   Choice offered to / list presented to : Patient  Discharge Placement                Home with [facility]        Discharge Plan and Services   Discharge Planning Services: CM Consult Post Acute Care Choice: Home Health, Resumption of Svcs/PTA Provider          DME Arranged: N/A DME Agency: NA       HH Arranged: PT HH Agency: [place] Date [place]: 04/21/19 Time HH Agency Contacted: 1000 Representative spoke with at [place]: [place] ([place]) Interventions     Readmission Risk Interventions Readmission Risk Prevention Plan 04/21/2019 03/08/2019  Transportation Screening Complete -  Medication Review Press photographer) Complete Complete  PCP or Specialist appointment within 3-5 days of discharge Complete -  [place] or Home Care Consult Complete -  SW Recovery Care/Counseling Consult Complete -  Palliative Care Screening Not Applicable -  [place] Not Applicable -  Some recent data might be hidden

## 2019-04-21 NOTE — Discharge Summary (Signed)
Physician Discharge Summary  Betty Jackson [id number] DOB: 1934-05-22 DOA: 04/15/2019  PCP: Eulas Post, MD  Admit date: 04/15/2019 Discharge date: 04/21/2019  Time spent: 50 minutes  Recommendations for Outpatient Follow-up:  1. Follow-up neurosurgery in 2 weeks 2. Follow-up cardiology as outpatient 3. Follow-up PCP in 1 week 4. Follow-up orthopedics in 2 weeks 5. Instructions per orthopedics, continue weightbearing as tolerated, physical therapy    Discharge Diagnoses:  Active Problems:   Sepsis [facility])   Discharge Condition: Stable  Diet recommendation: Heart healthy diet  Filed Weights   04/15/19 1048  Weight: 70.3 kg    History of present illness:  85 year old female with past medical history of CKD stage III, CAD, stroke, not temporal arteritis, rheumatoid arthritis on chronic steroids, s/p L2-3, L3-4 posterior fusion by Dr. Ellene Route in 2015 and L5-S1 microdiscectomy by Dr. Ellene Route in 2017.  Patient's daughter noted that she was limping around and she was complaining of worsening back pain.  She was found to lethargic and confused by daughter.  Evaluation in the ED showed CT abdomen pelvis revealed a prior small nondisplaced fracture involving the left ischium now appears to be aggressive lytic process with marked periostitis.  There is also marked lucency of L2-L3-L4 vertebral bodies and possible T12.  She also had elevated troponin, cardiology was consulted.   Hospital Course:  1. Left ischium fracture-nondisplaced, found incidentally on CT of the abdomen and pelvis.  She was seen by orthopedic surgery as well as neurosurgery.  As per orthopedic surgery she will be less ideal surgical candidate due to see history of severe osteoporosis.  MRI pelvis showed avulsion type fracture of left ischial tuberosity with more likely aggressive pattern of callus formation than actual bone lesion causing the periostitis.  No definite soft tissue mass identified.  Orthopedic surgery  recommended weightbearing as tolerated.  Physical therapy.  Follow-up with orthopedics in 2 weeks.  2. Chronic low back pain-patient has multilevel bilateral foraminal stenosis most severe on the right at L5-S1.  S/p lumbar epidural steroid injection done yesterday.  She is slowly improving.  Physical therapy has been set up at home.  Called and discussed with neurosurgeon Dr. Ellene Route, he will see patient as outpatient next week.  3. ?  Sepsis-unclear etiology, patient was started empirically on cefepime and Zithromax.  Urine culture showed no growth.  MRI ruled out infectious process of spine.  Lactic acid was 2.6.  Chest x-ray showed possible pneumonia.  She has been afebrile, WBC improved to 8.6.  Patient has received 6 days of antibiotics, she continues to be afebrile.  Will discontinue antibiotics at this time.    4. Elevated troponin-patient denies chest pain, troponin trending down.  2D echo showed normal EF, no wall motion normalities.  Cardiology following.  Plan for ischemia work-up as outpatient.  Patient has elevated BNP, she has been taking Lasix 40 mg twice a day at home.  Called and discussed with cardiologist Dr. Samuella Bruin, and he agrees with cutting down Lasix to 40 mg once a day.  We will reduce the dose of Lasix to 40 mg once a day.  5. Chronic atrial fibrillation-continue Cardizem, metoprolol.  Eliquis has been restarted.  Amiodarone has been discontinued per cardiology.  6. Chronic disease stage III-stable  7. Rheumatoid arthritis-continue prednisone  8. Hypertension-continue home medication including metoprolol, Cardizem, losartan   Procedures:  Lumbar epidural steroid injection  Consultations:  Cardiology  Neurosurgery  Orthopedics  IR  Discharge Exam: Vitals:   04/21/19 0404 04/21/19  0814  BP: (!) 167/76 (!) 161/63  Pulse: (!) 57 64  Resp: (!) 22   Temp: 97.8 F (36.6 C) 97.6 F (36.4 C)  SpO2: 96%     General: Appears in no acute  distress Cardiovascular: S1-S2, regular, no murmur auscultated Respiratory: Abdomen is soft, nontender, no organomegaly  Discharge Instructions   Discharge Instructions    Diet - low sodium heart healthy   Complete by: As directed    Increase activity slowly   Complete by: As directed      Allergies as of 04/21/2019      Reactions   Dextromethorphan Rash      Medication List    STOP taking these medications   amiodarone 200 MG tablet Commonly known as: PACERONE     TAKE these medications   cholecalciferol 1000 units tablet Commonly known as: VITAMIN D Take 1,000 Units by mouth daily.   dicyclomine 20 MG tablet Commonly known as: BENTYL Take 1 tablet (20 mg total) by mouth 2 (two) times daily as needed for spasms.   diltiazem 60 MG tablet Commonly known as: Cardizem Take 1 tablet (60 mg total) by mouth 3 (three) times daily.   Eliquis 5 MG Tabs tablet Generic drug: apixaban TAKE 1 TABLET BY MOUTH TWICE A DAY What changed: how much to take   escitalopram 10 MG tablet Commonly known as: LEXAPRO TAKE 1 TABLET BY MOUTH EVERY DAY   ferrous sulfate 325 (65 FE) MG tablet Take 1 tablet (325 mg total) by mouth 2 (two) times daily with a meal.   furosemide 20 MG tablet Commonly known as: LASIX Take 2 tablets (40 mg total) by mouth daily. What changed: when to take this   HYDROcodone-acetaminophen 5-325 MG tablet Commonly known as: NORCO/VICODIN Take one tablet by mouth every 8 hours as needed for pain. May refill in two months. What changed:   how much to take  how to take this  when to take this  reasons to take this  additional instructions   lidocaine 5 % ointment Commonly known as: XYLOCAINE Apply 1 application topically 3 (three) times daily as needed.   losartan 100 MG tablet Commonly known as: COZAAR Take 100 mg by mouth daily.   metoprolol tartrate 100 MG tablet Commonly known as: LOPRESSOR Take 1 tablet (100 mg total) by mouth 2 (two)  times daily.   ondansetron 4 MG disintegrating tablet Commonly known as: Zofran ODT Take 1 tablet (4 mg total) by mouth every 8 (eight) hours as needed for nausea or vomiting.   pantoprazole 40 MG tablet Commonly known as: PROTONIX TAKE 1 TABLET BY MOUTH EVERY DAY   potassium chloride 10 MEQ tablet Commonly known as: KLOR-CON Take 1 tablet (10 mEq total) by mouth daily. What changed:   medication strength  how much to take   predniSONE 10 MG tablet Commonly known as: DELTASONE Take 1 tablet (10 mg total) by mouth daily.   rosuvastatin 10 MG tablet Commonly known as: CRESTOR Take 10 mg by mouth at bedtime.   vitamin C 500 MG tablet Commonly known as: ASCORBIC ACID Take 1,000 mg by mouth daily.   vitamin E 180 MG (400 UNITS) capsule Generic drug: vitamin E Take 400 Units by mouth daily.      Allergies  Allergen Reactions  . Dextromethorphan Rash   Follow-up Information    Marchia Bond, MD. Schedule an appointment as soon as possible for a visit in 2 week(s).   Specialty: Orthopedic Surgery Contact information:  [address] [place] [address] [phone]        Eulas Post, MD Follow up in 1 week(s).   Specialty: Family Medicine Contact information: [place] [address] [phone]        Burnell Blanks, MD .   Specialty: Cardiology Contact information: [address] [place] [address] [phone]            The results of significant diagnostics from this hospitalization (including imaging, microbiology, ancillary and laboratory) are listed below for reference.    Significant Diagnostic Studies: DG Chest 2 View  Result Date: 03/29/2019 CLINICAL DATA:  Pericardial window.  History of hypertension EXAM: CHEST - 2 VIEW COMPARISON:  03/11/2019 FINDINGS: There is been interval removal of a right-sided IJ approach central venous catheter. Single median sternotomy  wire is noted. The heart size and mediastinal contours are within normal limits. Calcific aortic knob. Linear scarring versus atelectasis in the peripheral aspect of the left lung base. Lungs are otherwise clear. No pleural effusion or pneumothorax. Cement augmentation and fusion hardware at the lower thoracic spine. IMPRESSION: No active cardiopulmonary disease. Electronically Signed   By: Davina Poke D.O.   On: 03/29/2019 14:52   CT Angio Chest PE W and/or Wo Contrast  Result Date: 04/15/2019 CLINICAL DATA:  Shortness of breath.  Nausea and vomiting. EXAM: CT ANGIOGRAPHY CHEST CT ABDOMEN AND PELVIS WITH CONTRAST TECHNIQUE: Multidetector CT imaging of the chest was performed using the standard protocol during bolus administration of intravenous contrast. Multiplanar CT image reconstructions and MIPs were obtained to evaluate the vascular anatomy. Multidetector CT imaging of the abdomen and pelvis was performed using the standard protocol during bolus administration of intravenous contrast. CONTRAST:  177mL OMNIPAQUE IOHEXOL 350 MG/ML SOLN COMPARISON:  Abdominal CT scan 03/06/2019 the FINDINGS: CTA CHEST FINDINGS Cardiovascular: The heart is normal in size and stable. There is stable prominent pericardial fat and a small persistent pericardial effusion. The aorta is normal in caliber. Scattered atherosclerotic calcifications. Stable coronary artery calcifications. The pulmonary arterial tree is well opacified. No filling defects to suggest pulmonary embolism. Mediastinum/Nodes: Stable scattered mediastinal and hilar lymph nodes and scattered calcified nodes. The esophagus is grossly normal. Lungs/Pleura: Mosaic pattern of ground-glass attenuation in the lungs diffusely likely due to small airways disease such as asthma, constrictive, acute or respiratory bronchiolitis, hypersensitivity pneumonitis or cryptogenic organizing pneumonia. No focal infiltrates or worrisome pulmonary lesions. There are patchy  areas of subsegmental atelectasis and a small right pleural effusion. Musculoskeletal: No breast masses, supraclavicular or axillary adenopathy. The bony thorax is intact. Vertebral augmentation changes are noted at T11. Review of the MIP images confirms the above findings. CT ABDOMEN and PELVIS FINDINGS Hepatobiliary: No focal hepatic lesions are identified. There is mild intra and extrahepatic biliary dilatation likely due to prior cholecystectomy. The portal and hepatic veins are patent. Pancreas: No mass, inflammation or ductal dilatation. Stable moderate atrophy. Spleen: Normal size.  Small calcified granulomas. Adrenals/Urinary Tract: The adrenal glands and kidneys are unremarkable. The bladder appears normal. Stomach/Bowel: The stomach, duodenum, small bowel and colon are grossly normal. No acute inflammatory changes, mass lesions or obstructive findings. The terminal ileum and appendix are normal. Descending colon and sigmoid colon diverticulosis but no findings for acute diverticulitis. Vascular/Lymphatic: Advanced atherosclerotic calcifications involving the aorta and iliac arteries. No aneurysm or dissection. The branch vessels are patent. The major venous structures are patent. No mesenteric or retroperitoneal mass or adenopathy. Small scattered  lymph nodes are stable. Reproductive: Surgically absent. Other: No pelvic mass or free pelvic fluid collections. No inguinal mass or adenopathy. Musculoskeletal: A stable appearing right hip prosthesis. Advanced degenerative changes involving the left hip. Postoperative changes involving the spine with marked lytic appearance of L2, L3 and L4. T12 also appears somewhat expanded and lytic. There is also a aggressive lytic lesion involving the left ischium and marked periostitis. Findings suspicious for osseous metastatic disease. Review of the MIP images confirms the above findings. IMPRESSION: 1. No CT findings for pulmonary embolism. 2. No aortic aneurysm or  dissection. 3. Persistent small pericardial effusion and prominent pericardial fat. 4. Mosaic pattern of ground-glass attenuation throughout the lungs most likely small airways disease. Please see above discussion. 5. Small right pleural effusion and overlying atelectasis. 6. No acute abdominal/pelvic findings, mass lesions or adenopathy. 7. Prior small nondisplaced fracture involving the left ischium now appears to be an aggressive lytic process with marked periostitis. There is also marked lucency of the L2, L3 and L4 vertebral bodies and possibly T12. Could not exclude metastatic disease. Electronically Signed   By: Marijo Sanes M.D.   On: 04/15/2019 13:50   CT PELVIS WO CONTRAST  Result Date: 03/27/2019 CLINICAL DATA:  Sacral insufficiency fractures EXAM: CT PELVIS WITHOUT CONTRAST TECHNIQUE: Multidetector CT imaging of the pelvis was performed following the standard protocol without intravenous contrast. COMPARISON:  CT 03/06/2019 FINDINGS: Bones/Joint/Cartilage Bandlike area of sclerosis within the periphery of the right sacral ala compatible with evolving subacute to chronic sacral insufficiency fracture. Fracture line is not well seen. There is no evidence of involvement of the sacral neural foramina. SI joint is not widened. Robust periosteal new bone formation at site of subacute left ischial tuberosity avulsion fracture. Approximately 1.3 cm of fracture diastasis, similar to prior. Ill-defined appearance of the fracture margins favored to reflect hyperemia/healing changes (series 4, image 61; series 5, image 94). Visualized portion of right total hip arthroplasty and lumbar fusion hardware appear grossly intact without evidence of complication. No new fractures are evident. Ligaments Suboptimally evaluated by CT. Muscles and Tendons Left hamstring origin avulsion injury. Remaining tendinous structures about the pelvis appear grossly intact. No discrete muscular abnormalities. Soft tissues Sigmoid  diverticulosis without evidence of diverticulitis. Aortoiliac atherosclerotic calcifications. No soft tissue fluid collections. IMPRESSION: 1. Evolving subacute to chronic right sacral insufficiency fracture. 2. Robust periosteal new bone formation at site of subacute left ischial tuberosity avulsion fracture. Ill-defined appearance of the fracture margins favored to reflect hyperemia/healing changes. 3. Visualized portion of the right total hip arthroplasty and lumbar fusion hardware appear grossly intact without evidence of complication. 4. Sigmoid diverticulosis without evidence of diverticulitis. 5. Aortic atherosclerosis. Aortic Atherosclerosis (ICD10-I70.0). Electronically Signed   By: Davina Poke D.O.   On: 03/27/2019 15:38   MR THORACIC SPINE W WO CONTRAST  Result Date: 04/16/2019 CLINICAL DATA:  Back pain. Abnormal CT EXAM: MRI THORACIC WITHOUT AND WITH CONTRAST TECHNIQUE: Multiplanar and multiecho pulse sequences of the thoracic spine were obtained without and with intravenous contrast. CONTRAST:  30mL GADAVIST GADOBUTROL 1 MMOL/ML IV SOLN COMPARISON:  CT 04/15/2019 FINDINGS: MRI THORACIC SPINE FINDINGS Alignment: Thoracic vertebral alignment is maintained without significant listhesis. Localizer sequences which include the cervical spine demonstrate grade 1 anterolisthesis C3 on C4, C4 on C5, and C5 on C6. Vertebrae: Low T1-T2 signal changes within the T11 and L1 vertebral segments compatible with prior cement augmentation. No acute fractures. No suspicious bone lesion. Scattered intraosseous hemangiomas including within the T8 and T9  vertebrae. No evidence of discitis. Cord: Increased cord signal at T10-11 and T11-12 where there is high-grade canal stenosis and cord compression (series 36, image 29; series 35, image 9). The remaining thoracic cord is otherwise normal in signal and caliber. Paraspinal and other soft tissues: Small right pleural effusion. Disc levels: T1-T2: Mild left foraminal  narrowing secondary to facet hypertrophy. No canal stenosis. T2-T3: Mild diffuse disc bulge without foraminal or canal stenosis. T3-T4: Mild diffuse disc bulge without foraminal or canal stenosis. T4-T5: No significant canal or neural foraminal narrowing T5-T6: No significant canal or neural foraminal narrowing T6-T7: No significant canal or neural foraminal narrowing T7-T8: No significant canal or neural foraminal narrowing T8-T9: No significant canal or neural foraminal narrowing T9-T10: No significant canal or neural foraminal narrowing T10-T11: Left paracentral disc bulge and posterior element hypertrophy result in moderate to severe canal stenosis with compression of the cord and increased T2 cord signal. There is moderate to severe left and mild right foraminal stenosis. T11-T12: Diffuse disc bulge, eccentric to the left, posterior element hypertrophy, and prominent posterior epidural fat contribute to moderate to severe canal stenosis with compression of the cord and increased T2 cord signal. Severe left foraminal stenosis. IMPRESSION: 1. 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 2. Severe left foraminal stenosis at T11-T12 and severe left foraminal stenosis at T11-T12. 3. No acute fracture or suspicious marrow replacing lesion. 4. Small right pleural effusion. Electronically Signed   By: Davina Poke D.O.   On: 04/16/2019 12:47   MR Lumbar Spine W Wo Contrast  Addendum Date: 04/16/2019   ADDENDUM REPORT: 04/16/2019 13:12 ADDENDUM: Findings were discussed via telephone with Dr. Tyrell Antonio at 1:10 p.m. on 04/16/2019 by Dr. Davina Poke. Electronically Signed   By: Davina Poke D.O.   On: 04/16/2019 13:12   Result Date: 04/16/2019 CLINICAL DATA:  Abnormal appearance of the lumbar spine on recent CT. Concern for osseous metastatic disease. History of lumbar fusion  EXAM: MRI LUMBAR SPINE WITHOUT AND WITH CONTRAST TECHNIQUE: Multiplanar and multiecho pulse sequences of the lumbar spine were obtained without and with intravenous contrast. CONTRAST:  60mL GADAVIST GADOBUTROL 1 MMOL/ML IV SOLN COMPARISON:  CT 04/15/2019. MRI 08/13/2016 FINDINGS: Segmentation:  Standard. Alignment:  Lumbar levocurvature. Trace retrolisthesis L1 on L2. Vertebrae: Prior posterior instrumented fusion and decompression at L2-L4. Susceptibility artifact related to hardware slightly degrades evaluation of the adjacent structures. Low T1/T2 signal related to prior cement augmentation within the L1 and T11 vertebral bodies. Bone marrow signal is mildly heterogeneous and largely fatty replaced. No evidence of fracture, marrow replacing lesion, or discitis. Conus medullaris and cauda equina: Conus extends to the L1-L2 level. Cauda equina appear normal. The lower cord is mildly increased in signal the T11-12 through T12-L1 levels. Paraspinal and other soft tissues: Negative. Disc levels: T10-T11: Sagittal sequences only. Diffuse disc bulge and posterior element hypertrophy results in moderate to severe canal stenosis with evidence the cord compression (series 9, image 8). T11-T12: Sagittal sequences only. Diffuse disc bulge and posterior element hypertrophy result in moderate to severe canal stenosis with evidence of cord compression (series 9, image 8). T12-L1: Diffuse disc bulge and posterior element hypertrophy result in moderate canal stenosis and mild-to-moderate bilateral foraminal stenosis, left worse than right. L1-L2: Slight retropulsion and endplate ridging with mild posterior element hypertrophy resulting in mild-to-moderate right foraminal stenosis and mild canal stenosis. L2-L3: Prior fusion and decompression without  evidence of significant residual foraminal or canal stenosis. L3-L4: Prior fusion and decompression without evidence of significant residual foraminal or canal stenosis. L4-L5:  Diffuse disc bulge with right paracentral extrusion with 16 mm caudal extension of disc material (series 13, image 8; series 11, image 30). Moderate to severe canal stenosis at this level. Moderate left foraminal stenosis with prominent left-sided facet arthropathy contributing. L5-S1: Mild diffuse disc bulge with endplate ridging and bilateral facet arthropathy. Severe right and mild left foraminal stenosis and mild-to-moderate canal stenosis. IMPRESSION: 1. No acute fracture, marrow replacing lesion, or evidence of discitis. 2. Moderate to severe multifactorial canal stenosis at T10-T11 and T11-12 with evidence of cord compression. Subtly increased STIR signal within the cord at these levels suggestive of compressive myelopathy. 3. Moderate to severe canal stenosis at T12-L1 and L4-5. 4. Multilevel bilateral foraminal stenosis most severe on the right at L5-S1. 5. Prior posterior instrumented fusion and decompression at L2-L3 and L3-L4. No significant residual foraminal or canal stenosis at fused levels. These results will be called to the ordering clinician or representative by the Radiologist Assistant, and communication documented in the PACS or zVision Dashboard. Electronically Signed: By: Davina Poke D.O. On: 04/16/2019 12:28   MR PELVIS W WO CONTRAST  Result Date: 04/17/2019 CLINICAL DATA:  Evaluate left ischial lesion seen on recent CT scan. EXAM: MRI PELVIS WITHOUT AND WITH CONTRAST TECHNIQUE: Multiplanar multisequence MR imaging of the pelvis was performed both before and after administration of intravenous contrast. CONTRAST:  52mL GADAVIST GADOBUTROL 1 MMOL/ML IV SOLN COMPARISON:  CT scan 04/15/2019 FINDINGS: Diffuse low T1 and high T2 signal intensity in the ischial tuberosity without definite soft tissue component. On the coronal images this appears to be fairly well circumscribed and has the appearance of a progressive avulsion fracture at the ischial tuberosity with probable aggressive  callus formation due to motion by the pull of the hamstring tendons. I do not see a definite neoplastic process. No other pelvic lesions are identified. Moderate edema like signal changes in the left hip musculature, most notably the gluteus minimus and medius muscles and the abductor muscles likely muscle tear/muscle strains. Similar findings involving the right abductor muscles. No significant intrapelvic abnormalities. Moderate artifact from the right hip prosthesis. IMPRESSION: 1. Progressive avulsion type fracture at the left ischial tuberosity with more likely an aggressive pattern of callus formation than an actual bone lesion causing the periostitis. No definite soft tissue mass is identified and no other bone lesions are identified. 2. Recommend follow-up CT scan in 4-6 weeks to reassess healing changes. Electronically Signed   By: Marijo Sanes M.D.   On: 04/17/2019 16:27   CT ABDOMEN PELVIS W CONTRAST  Result Date: 04/15/2019 CLINICAL DATA:  Shortness of breath.  Nausea and vomiting. EXAM: CT ANGIOGRAPHY CHEST CT ABDOMEN AND PELVIS WITH CONTRAST TECHNIQUE: Multidetector CT imaging of the chest was performed using the standard protocol during bolus administration of intravenous contrast. Multiplanar CT image reconstructions and MIPs were obtained to evaluate the vascular anatomy. Multidetector CT imaging of the abdomen and pelvis was performed using the standard protocol during bolus administration of intravenous contrast. CONTRAST:  135mL OMNIPAQUE IOHEXOL 350 MG/ML SOLN COMPARISON:  Abdominal CT scan 03/06/2019 the FINDINGS: CTA CHEST FINDINGS Cardiovascular: The heart is normal in size and stable. There is stable prominent pericardial fat and a small persistent pericardial effusion. The aorta is normal in caliber. Scattered atherosclerotic calcifications. Stable coronary artery calcifications. The pulmonary arterial tree is well opacified. No filling defects to  suggest pulmonary embolism.  Mediastinum/Nodes: Stable scattered mediastinal and hilar lymph nodes and scattered calcified nodes. The esophagus is grossly normal. Lungs/Pleura: Mosaic pattern of ground-glass attenuation in the lungs diffusely likely due to small airways disease such as asthma, constrictive, acute or respiratory bronchiolitis, hypersensitivity pneumonitis or cryptogenic organizing pneumonia. No focal infiltrates or worrisome pulmonary lesions. There are patchy areas of subsegmental atelectasis and a small right pleural effusion. Musculoskeletal: No breast masses, supraclavicular or axillary adenopathy. The bony thorax is intact. Vertebral augmentation changes are noted at T11. Review of the MIP images confirms the above findings. CT ABDOMEN and PELVIS FINDINGS Hepatobiliary: No focal hepatic lesions are identified. There is mild intra and extrahepatic biliary dilatation likely due to prior cholecystectomy. The portal and hepatic veins are patent. Pancreas: No mass, inflammation or ductal dilatation. Stable moderate atrophy. Spleen: Normal size.  Small calcified granulomas. Adrenals/Urinary Tract: The adrenal glands and kidneys are unremarkable. The bladder appears normal. Stomach/Bowel: The stomach, duodenum, small bowel and colon are grossly normal. No acute inflammatory changes, mass lesions or obstructive findings. The terminal ileum and appendix are normal. Descending colon and sigmoid colon diverticulosis but no findings for acute diverticulitis. Vascular/Lymphatic: Advanced atherosclerotic calcifications involving the aorta and iliac arteries. No aneurysm or dissection. The branch vessels are patent. The major venous structures are patent. No mesenteric or retroperitoneal mass or adenopathy. Small scattered lymph nodes are stable. Reproductive: Surgically absent. Other: No pelvic mass or free pelvic fluid collections. No inguinal mass or adenopathy. Musculoskeletal: A stable appearing right hip prosthesis. Advanced  degenerative changes involving the left hip. Postoperative changes involving the spine with marked lytic appearance of L2, L3 and L4. T12 also appears somewhat expanded and lytic. There is also a aggressive lytic lesion involving the left ischium and marked periostitis. Findings suspicious for osseous metastatic disease. Review of the MIP images confirms the above findings. IMPRESSION: 1. No CT findings for pulmonary embolism. 2. No aortic aneurysm or dissection. 3. Persistent small pericardial effusion and prominent pericardial fat. 4. Mosaic pattern of ground-glass attenuation throughout the lungs most likely small airways disease. Please see above discussion. 5. Small right pleural effusion and overlying atelectasis. 6. No acute abdominal/pelvic findings, mass lesions or adenopathy. 7. Prior small nondisplaced fracture involving the left ischium now appears to be an aggressive lytic process with marked periostitis. There is also marked lucency of the L2, L3 and L4 vertebral bodies and possibly T12. Could not exclude metastatic disease. Electronically Signed   By: Marijo Sanes M.D.   On: 04/15/2019 13:50   IR Epidurography  Result Date: 04/20/2019 CLINICAL DATA:  Low back pain extending into the buttocks bilaterally, right greater than left. Displacement of the lumbar disc at L4-5 and L5-S1. FLUOROSCOPY TIME:  Radiation Exposure Index (as provided by the fluoroscopic device): 46.6 uGy*m2 PROCEDURE: The procedure, risks, benefits, and alternatives were explained to the patient. Questions regarding the procedure were encouraged and answered. The patient understands and consents to the procedure. LUMBAR EPIDURAL INJECTION: An interlaminar approach was performed on right at L5-S1. The overlying skin was cleansed and anesthetized. A 20 gauge epidural needle was advanced using loss-of-resistance technique. DIAGNOSTIC EPIDURAL INJECTION: Injection of Isovue-M 200 shows a good epidural pattern with spread above and  below the level of needle placement, primarily on the right no vascular opacification is seen. THERAPEUTIC EPIDURAL INJECTION: 120 mg of Depo-Medrol mixed with 1 mL 1% lidocaine were instilled. The procedure was well-tolerated, and the patient was discharged thirty minutes following the injection in good  condition. COMPLICATIONS: None IMPRESSION: Technically successful epidural injection on the right L5-S1 # 1 Electronically Signed   By: San Morelle M.D.   On: 04/20/2019 12:29   DG Chest Port 1 View  Result Date: 04/15/2019 CLINICAL DATA:  Altered mental status. EXAM: PORTABLE CHEST 1 VIEW COMPARISON:  March 29, 2019 FINDINGS: Cardiomediastinal silhouette is enlarged. Mediastinal contours appear intact. There is no evidence of pleural effusion or pneumothorax. Bilateral lower lobe atelectasis versus mild peribronchial airspace consolidation. Osseous structures are without acute abnormality. Soft tissues are grossly normal. IMPRESSION: 1. Bilateral lower lobe atelectasis versus mild peribronchial airspace consolidation. 2. Enlarged cardiac silhouette. Electronically Signed   By: Fidela Salisbury M.D.   On: 04/15/2019 11:40   ECHOCARDIOGRAM COMPLETE  Result Date: 04/17/2019   ECHOCARDIOGRAM REPORT   Patient Name:   Betty Jackson Date of Exam: 04/17/2019 Medical Rec #:  [id number]    Height:       64.5 in Accession #:    [id number]   Weight:       155.0 lb Date of Birth:  10-Jan-1935    BSA:          1.76 m Patient Age:    42 years     BP:           148/67 mmHg Patient Gender: F            HR:           63 bpm. Exam Location:  Inpatient Procedure: 2D Echo Indications:    Elevated troponin  History:        Patient has prior history of Echocardiogram examinations, most                 recent 03/09/2019. Arrythmias:Atrial Fibrillation. CKD. CAD. H/o                 stroke.  Sonographer:    Clayton Lefort RDCS (AE) Referring Phys: [id number] BELKYS A REGALADO IMPRESSIONS  1. Left ventricular ejection fraction, by  visual estimation, is 60 to 65%. The left ventricle has normal function. There is mildly increased left ventricular hypertrophy.  2. Left ventricular diastolic parameters are consistent with Grade II diastolic dysfunction (pseudonormalization). The mean left atrial pressure is high.  3. The left ventricle has no regional wall motion abnormalities.  4. Global right ventricle has normal systolic function.The right ventricular size is normal. No increase in right ventricular wall thickness.  5. Left atrial size was mildly dilated.  6. Right atrial size was mildly dilated.  7. Mild mitral annular calcification.  8. The mitral valve is normal in structure. Trivial mitral valve regurgitation.  9. The tricuspid valve is normal in structure. 10. The tricuspid valve is normal in structure. Tricuspid valve regurgitation is mild-moderate. 11. The aortic valve is normal in structure. Aortic valve regurgitation is trivial. 12. The pulmonic valve was not well visualized. Pulmonic valve regurgitation is not visualized. 13. Mildly elevated pulmonary artery systolic pressure. 14. The tricuspid regurgitant velocity is 2.18 m/s, and with an assumed right atrial pressure of 15 mmHg, the estimated right ventricular systolic pressure is mildly elevated at 34.0 mmHg. 15. The inferior vena cava is dilated in size with <50% respiratory variability, suggesting right atrial pressure of 15 mmHg. 16. There is exaggerated respiratory displacement of the interventricular septum. This could represent contrictive physiology , but can also be seen with increased work of breathing (e.g. COPD or asthma exacerbation). 17. Compared to 03/09/2019, left ventricular wall motion and overall  systolic function have improved. FINDINGS  Left Ventricle: Left ventricular ejection fraction, by visual estimation, is 60 to 65%. The left ventricle has normal function. The left ventricle has no regional wall motion abnormalities. The left ventricular internal cavity  size was the left ventricle is normal in size. There is mildly increased left ventricular hypertrophy. Concentric left ventricular hypertrophy. Left ventricular diastolic parameters are consistent with Grade II diastolic dysfunction (pseudonormalization). Right Ventricle: The right ventricular size is normal. No increase in right ventricular wall thickness. Global RV systolic function is has normal systolic function. The tricuspid regurgitant velocity is 2.18 m/s, and with an assumed right atrial pressure  of 15 mmHg, the estimated right ventricular systolic pressure is mildly elevated at 34.0 mmHg. Left Atrium: Left atrial size was mildly dilated. Right Atrium: Right atrial size was mildly dilated Pericardium: There is no evidence of pericardial effusion. Mitral Valve: The mitral valve is normal in structure. Mild mitral annular calcification. Trivial mitral valve regurgitation. MV peak gradient, 4.4 mmHg. Tricuspid Valve: The tricuspid valve is normal in structure. Tricuspid valve regurgitation is mild-moderate. Aortic Valve: The aortic valve is normal in structure. Aortic valve regurgitation is trivial. Aortic regurgitation PHT measures 506 msec. Aortic valve mean gradient measures 3.0 mmHg. Aortic valve peak gradient measures 4.0 mmHg. Aortic valve area, by VTI measures 2.14 cm. Pulmonic Valve: The pulmonic valve was not well visualized. Pulmonic valve regurgitation is not visualized. Pulmonic regurgitation is not visualized. Aorta: The aortic root and ascending aorta are structurally normal, with no evidence of dilitation. Venous: The inferior vena cava is dilated in size with less than 50% respiratory variability, suggesting right atrial pressure of 15 mmHg. IAS/Shunts: No atrial level shunt detected by color flow Doppler.  LEFT VENTRICLE PLAX 2D LVIDd:         4.40 cm LVIDs:         3.40 cm LV PW:         1.30 cm LV IVS:        1.30 cm LVOT diam:     2.00 cm LV SV:         40 ml LV SV Index:   22.32 LVOT  Area:     3.14 cm  RIGHT VENTRICLE            IVC RV Basal diam:  3.00 cm    IVC diam: 2.70 cm RV S prime:     9.46 cm/s TAPSE (M-mode): 2.4 cm LEFT ATRIUM             Index       RIGHT ATRIUM           Index LA diam:        4.50 cm 2.55 cm/m  RA Area:     19.80 cm LA Vol (A2C):   75.8 ml 42.95 ml/m RA Volume:   50.70 ml  28.73 ml/m LA Vol (A4C):   56.3 ml 31.90 ml/m LA Biplane Vol: 65.9 ml 37.34 ml/m  AORTIC VALVE AV Area (Vmax):    2.30 cm AV Area (Vmean):   2.20 cm AV Area (VTI):     2.14 cm AV Vmax:           99.70 cm/s AV Vmean:          78.600 cm/s AV VTI:            0.267 m AV Peak Grad:      4.0 mmHg AV Mean Grad:      3.0 mmHg  LVOT Vmax:         73.10 cm/s LVOT Vmean:        55.100 cm/s LVOT VTI:          0.182 m LVOT/AV VTI ratio: 0.68 AI PHT:            506 msec  AORTA Ao Root diam: 3.00 cm Ao Asc diam:  3.30 cm MITRAL VALVE              TRICUSPID VALVE MV Area (PHT): 2.44 cm   TR Peak grad:   19.0 mmHg MV Peak grad:  4.4 mmHg   TR Vmax:        239.00 cm/s MV Mean grad:  1.0 mmHg MV Vmax:       1.05 m/s   SHUNTS MV Vmean:      50.4 cm/s  Systemic VTI:  0.18 m MV VTI:        0.31 m     Systemic Diam: 2.00 cm MV PHT:        90.00 msec  Dani Gobble Croitoru MD Electronically signed by Sanda Klein MD Signature Date/Time: 04/17/2019/10:09:57 PM    Final    DG Hip Unilat W or Wo Pelvis 2-3 Views Right  Result Date: 04/13/2019 CLINICAL DATA:  Fall with right-sided hip pain EXAM: DG HIP (WITH OR WITHOUT PELVIS) 2-3V RIGHT COMPARISON:  08/17/2018, CT 03/27/2019 FINDINGS: SI joints are non widened. CT described sacral insufficiency fractures are not well seen on the radiograph. Pubic symphysis and rami appear intact. Subacute to chronic avulsion fracture off the left ischial tuberosity again noted. Status post right hip replacement with intact hardware and normal alignment. No acute fracture is seen. IMPRESSION: 1. Status post right hip replacement without acute osseous abnormality. 2. Subacute to  chronic avulsion fracture off the left ischial tuberosity as before. Electronically Signed   By: Donavan Foil M.D.   On: 04/13/2019 17:57   US Abdomen Limited RUQ  Result Date: 04/15/2019 CLINICAL DATA:  Elevated liver function tests. EXAM: ULTRASOUND ABDOMEN LIMITED RIGHT UPPER QUADRANT COMPARISON:  CT of the abdomen April 15, 2019 FINDINGS: Gallbladder: Post cholecystectomy.  No focal fluid collection. Common bile duct: Diameter: 6.6 mm Liver: No focal lesion identified. Within normal limits in parenchymal echogenicity. Portal vein is patent on color Doppler imaging with normal direction of blood flow towards the liver. Other: None. IMPRESSION: Normal right upper quadrant ultrasound post cholecystectomy. Electronically Signed   By: Fidela Salisbury M.D.   On: 04/15/2019 16:00    Microbiology: Recent Results (from the past 240 hour(s))  Blood Culture (routine x 2)     Status: None   Collection Time: 04/15/19 10:52 AM   Specimen: BLOOD  Result Value Ref Range Status   Specimen Description BLOOD LEFT ANTECUBITAL  Final   Special Requests   Final    BOTTLES DRAWN AEROBIC AND ANAEROBIC Blood Culture results may not be optimal due to an inadequate volume of blood received in culture bottles   Culture   Final    NO GROWTH 5 DAYS Performed at [facility], [place] [address], [place], [place] [address]    Report Status 04/20/2019 FINAL  Final  Blood Culture (routine x 2)     Status: None   Collection Time: 04/15/19 10:54 AM   Specimen: BLOOD  Result Value Ref Range Status   Specimen Description BLOOD RIGHT ANTECUBITAL  Final   Special Requests   Final    BOTTLES DRAWN AEROBIC AND ANAEROBIC  Blood Culture adequate volume   Culture   Final    NO GROWTH 5 DAYS Performed at [facility], [place] [address], [place], [place] [address]    Report Status 04/20/2019 FINAL  Final  Urine culture     Status: None   Collection Time: 04/15/19  4:49 PM   Specimen: In/Out Cath Urine  Result  Value Ref Range Status   Specimen Description IN/OUT CATH URINE  Final   Special Requests NONE  Final   Culture   Final    NO GROWTH Performed at [facility], [place] [address], [place], [place] [address]    Report Status 04/16/2019 FINAL  Final  MRSA PCR Screening     Status: None   Collection Time: 04/16/19 12:33 PM   Specimen: Nasal Mucosa; Nasopharyngeal  Result Value Ref Range Status   MRSA by PCR NEGATIVE NEGATIVE Final    Comment:        The GeneXpert MRSA Assay (FDA approved for NASAL specimens only), is one component of a comprehensive MRSA colonization surveillance program. It is not intended to diagnose MRSA infection nor to guide or monitor treatment for MRSA infections. Performed at [facility], [place] [address] [place], [place] [address]      Labs: Basic Metabolic Panel: Recent Labs  Lab 04/15/19 1840 04/16/19 0706 04/17/19 0759 04/18/19 0241 04/19/19 0404  NA 138 142 143 140 138  K 3.3* 4.2 4.4 4.3 4.0  CL 103 109 109 108 109  CO2 23 21* 24 21* 20*  GLUCOSE 104* 125* 126* 135* 125*  BUN 11 14 19  28* 30*  CREATININE 1.03* 1.06* 0.94 1.07* 1.27*  CALCIUM 7.6* 8.0* 8.8* 8.6* 8.7*  MG  --  1.3* 2.3 2.3  --   PHOS  --  4.2  --   --   --    Liver Function Tests: Recent Labs  Lab 04/15/19 1052 04/16/19 0706  AST 46* 29  ALT 36 26  ALKPHOS 101 79  BILITOT 2.5* 2.1*  PROT 6.3* 5.3*  ALBUMIN 3.3* 2.4*   Recent Labs  Lab 04/15/19 1052  LIPASE 28   Recent Labs  Lab 04/15/19 1134  AMMONIA 28   CBC: Recent Labs  Lab 04/15/19 1052 04/15/19 1148 04/16/19 0346 04/16/19 0706 04/17/19 0759 04/18/19 0241 04/19/19 0404 04/20/19 0227 04/21/19 0358  WBC 15.9*   < > 8.9   < > 15.6* 12.8* 8.6 10.9* 11.0*  NEUTROABS 11.9*  --  6.7  --   --   --   --   --   --   HGB 12.8   < > 7.4*   < > 9.3* 8.6* 8.7* 9.1* 9.7*  HCT 41.1   < > 24.3*   < > 29.5* 27.4* 28.0* 29.4* 30.7*  MCV 92.8   < > 97.2   < > 93.9 91.9 93.6 93.3 90.6  PLT 204   <  > 112*   < > 150 161 191 210 272   < > = values in this interval not displayed.   Cardiac Enzymes: Recent Labs  Lab 04/15/19 1840  CKTOTAL 78   BNP: BNP (last 3 results) Recent Labs    04/15/19 1052 04/18/19 0241  BNP 170.2* 930.1*        Signed:  Oswald Hillock MD.  [facility] 04/21/2019, 11:31 AM

## 2019-04-23 DIAGNOSIS — I0981 Rheumatic heart failure: Secondary | ICD-10-CM | POA: Diagnosis not present

## 2019-04-23 DIAGNOSIS — N183 Chronic kidney disease, stage 3 unspecified: Secondary | ICD-10-CM | POA: Diagnosis not present

## 2019-04-23 DIAGNOSIS — D631 Anemia in chronic kidney disease: Secondary | ICD-10-CM | POA: Diagnosis not present

## 2019-04-23 DIAGNOSIS — I130 Hypertensive heart and chronic kidney disease with heart failure and stage 1 through stage 4 chronic kidney disease, or unspecified chronic kidney disease: Secondary | ICD-10-CM | POA: Diagnosis not present

## 2019-04-23 DIAGNOSIS — I5032 Chronic diastolic (congestive) heart failure: Secondary | ICD-10-CM | POA: Diagnosis not present

## 2019-04-24 ENCOUNTER — Telehealth: Payer: Self-pay

## 2019-04-24 NOTE — Telephone Encounter (Signed)
Transition Care Management Follow-up Telephone Call  Date of discharge and from where: 04/21/19 from [facility]  How have you been since you were released from the hospital? "I'm doing some better"  Any questions or concerns? No   Items Reviewed:  Did the pt receive and understand the discharge instructions provided? Yes   Medications obtained and verified? Yes   Any new allergies since your discharge? No   Dietary orders reviewed? Yes  Do you have support at home? Yes ; husband, sister, and 2 daughters that live locally  Other (ie: DME, Home Health, etc) yes; assessment was done 04/22/19 and PT is coming 04/24/19.   Functional Questionnaire: (I = Independent and D = Dependent) ADL's: "my sister and daughters help me out a lot"  Bathing/Dressing- "my sister helps me with this"   Meal Prep- 'my sister does this'  Eating- Independent  Maintaining continence- 'my sister helps me'  Transferring/Ambulation- 'my sister helps me'  Managing Meds- "I can do this"   Follow up appointments reviewed:   PCP Hospital f/u appt confirmed? Yes  Scheduled to see Dr. Elease Hashimoto on 04/28/19 9:30am in office.  [facility] f/u appt confirmed? No  "I haven't heard from them just yet"  Are transportation arrangements needed? No   If their condition worsens, is the pt aware to call  their PCP or go to the ED? Yes  Was the patient provided with contact information for the PCP's office or ED? Yes  Was the pt encouraged to call back with questions or concerns? Yes

## 2019-04-25 ENCOUNTER — Other Ambulatory Visit: Payer: Self-pay | Admitting: Cardiovascular Disease

## 2019-04-25 DIAGNOSIS — N183 Chronic kidney disease, stage 3 unspecified: Secondary | ICD-10-CM | POA: Diagnosis not present

## 2019-04-25 DIAGNOSIS — I5032 Chronic diastolic (congestive) heart failure: Secondary | ICD-10-CM | POA: Diagnosis not present

## 2019-04-25 DIAGNOSIS — I0981 Rheumatic heart failure: Secondary | ICD-10-CM | POA: Diagnosis not present

## 2019-04-25 DIAGNOSIS — I130 Hypertensive heart and chronic kidney disease with heart failure and stage 1 through stage 4 chronic kidney disease, or unspecified chronic kidney disease: Secondary | ICD-10-CM | POA: Diagnosis not present

## 2019-04-25 DIAGNOSIS — D631 Anemia in chronic kidney disease: Secondary | ICD-10-CM | POA: Diagnosis not present

## 2019-04-26 DIAGNOSIS — D631 Anemia in chronic kidney disease: Secondary | ICD-10-CM | POA: Diagnosis not present

## 2019-04-26 DIAGNOSIS — I130 Hypertensive heart and chronic kidney disease with heart failure and stage 1 through stage 4 chronic kidney disease, or unspecified chronic kidney disease: Secondary | ICD-10-CM | POA: Diagnosis not present

## 2019-04-26 DIAGNOSIS — I0981 Rheumatic heart failure: Secondary | ICD-10-CM | POA: Diagnosis not present

## 2019-04-26 DIAGNOSIS — N183 Chronic kidney disease, stage 3 unspecified: Secondary | ICD-10-CM | POA: Diagnosis not present

## 2019-04-26 DIAGNOSIS — I5032 Chronic diastolic (congestive) heart failure: Secondary | ICD-10-CM | POA: Diagnosis not present

## 2019-04-27 DIAGNOSIS — D631 Anemia in chronic kidney disease: Secondary | ICD-10-CM | POA: Diagnosis not present

## 2019-04-27 DIAGNOSIS — I0981 Rheumatic heart failure: Secondary | ICD-10-CM | POA: Diagnosis not present

## 2019-04-27 DIAGNOSIS — I5032 Chronic diastolic (congestive) heart failure: Secondary | ICD-10-CM | POA: Diagnosis not present

## 2019-04-27 DIAGNOSIS — I130 Hypertensive heart and chronic kidney disease with heart failure and stage 1 through stage 4 chronic kidney disease, or unspecified chronic kidney disease: Secondary | ICD-10-CM | POA: Diagnosis not present

## 2019-04-27 DIAGNOSIS — N183 Chronic kidney disease, stage 3 unspecified: Secondary | ICD-10-CM | POA: Diagnosis not present

## 2019-04-28 ENCOUNTER — Other Ambulatory Visit: Payer: Self-pay

## 2019-04-28 ENCOUNTER — Encounter: Payer: Self-pay | Admitting: Family Medicine

## 2019-04-28 ENCOUNTER — Ambulatory Visit (INDEPENDENT_AMBULATORY_CARE_PROVIDER_SITE_OTHER): Payer: Medicare Other | Admitting: Family Medicine

## 2019-04-28 VITALS — BP 100/68 | HR 55 | Temp 97.3°F | Ht 64.0 in | Wt 152.6 lb

## 2019-04-28 DIAGNOSIS — R296 Repeated falls: Secondary | ICD-10-CM

## 2019-04-28 DIAGNOSIS — I480 Paroxysmal atrial fibrillation: Secondary | ICD-10-CM

## 2019-04-28 DIAGNOSIS — I10 Essential (primary) hypertension: Secondary | ICD-10-CM | POA: Diagnosis not present

## 2019-04-28 DIAGNOSIS — I319 Disease of pericardium, unspecified: Secondary | ICD-10-CM

## 2019-04-28 DIAGNOSIS — Z7952 Long term (current) use of systemic steroids: Secondary | ICD-10-CM

## 2019-04-28 DIAGNOSIS — M545 Low back pain, unspecified: Secondary | ICD-10-CM

## 2019-04-28 DIAGNOSIS — G8929 Other chronic pain: Secondary | ICD-10-CM

## 2019-04-28 MED ORDER — ALENDRONATE SODIUM 70 MG PO TABS: 70.0000 mg | ORAL_TABLET | ORAL | 11 refills | Status: DC

## 2019-04-28 NOTE — Progress Notes (Signed)
Subjective:     Patient ID: Betty Jackson, female   DOB: May 29, 1934, 85 y.o.   MRN: [id number]  HPI Betty Jackson is seen for hospital follow-up.  She has actually had 3 separate admissions over the past few months each lasting about a week.  She had admission back in end of 2020 for acute diverticulitis and atrial fibrillation.  She then had a fall with sacrum fracture and increased disability from that.  Her second admission was for sepsis.  She had pericarditis and had pericardial window.  Causative agent was unknown.  Most recent admission was January 16-22.  She had some worsening back pain and increased lethargy.  She was taken to the ED and had CT abdomen pelvis which showed small prior nondisplaced fracture involving left ischium with what initially appeared to be aggressive lytic lesion with marked periostitis.  She was seen by orthopedic surgery and neurosurgery and had MRI which showed avulsion type fracture left ischial tuberosity with more likely aggressive pattern of callus formation than actual bone lesion.  No soft tissue mass noted.  Physical therapy ordered.  She has chronic low back pain with multilevel bilateral foraminal stenosis especially on the right at L5-S1.  She has history of temporal arteritis and is followed by rheumatology and is on low-dose prednisone and has been on this for several years.  Regarding her sepsis not clear where etiology was.  Her urine culture was negative.  Blood cultures negative.  Chest x-ray showed possible pneumonia.  She was treated with broad-spectrum antibiotics and afebrile and white count normal at time of discharge.  No fever since then.  Chronic atrial fibrillation.  Amiodarone discontinued.  She remains on Eliquis, Cardizem, and metoprolol.  She has rheumatoid arthritis followed by rheumatology.  Daughter had consulted with her rheumatologist regarding her pericarditis and rheumatologist is going to get back with her regarding whether she wants any  additional labs.  Past Medical History:  Diagnosis Date  . Abdominal pain, epigastric 06/22/2013  . Abdominal pain, other specified site 12/24/2011  . Abnormal urinalysis 05/20/2017  . Acute respiratory failure with hypoxia ([place]) 05/20/2017  . Anemia, unspecified 10/28/2012  . Anxiety state, unspecified 10/28/2012  . Bruises easily    d/t being on prednisone and plavix  . CAD (coronary artery disease)    a. Stent RCA in [facility];  b. Cath approx 2009 - nonobs per pt report.  . Cataract    immature on the left eye  . Chronic insomnia 02/07/2013  . Chronic lower back pain    scoliosis  . CKD (chronic kidney disease) stage 3, GFR 30-59 ml/min   . Complication of anesthesia    pt has a very high tolerance to meds  . Coronary disease 05/20/2017  . CVA (cerebral infarction) 10/29/2012  . DDD (degenerative disc disease)   . Depression   . Diverticulitis    hx of  . Diverticulosis   . Elevated transaminase level 02/07/2013  . Enteritis   . GERD (gastroesophageal reflux disease) 09/01/2010  . Giant cell arteritis ([place])   . Hemorrhoids   . Herniated nucleus pulposus, L5-S1, right 11/04/2015  . History of scabies   . HTN (hypertension) 05/20/2017  . Hyperlipidemia    takes Lipitor daily  . Hypertension    takes Amlodipine,Losartan,Metoprolol,and HCTZ daily  . Incontinence of urine   . Influenza A 05/20/2017  . Insomnia    takes Restoril nightly  . Lumbar stenosis 04/25/2013  . Major depressive disorder, recurrent episode, moderate (  [place]) 07/16/2013  . Migraines    "back in my 20's; none since" (10/28/2012)  . Osteoporosis   . PAF (paroxysmal atrial fibrillation) ([place]) 2011   a. lone epidode in 2011 according to notes.  . Rheumatoid arthritis ([place])   . Scoliosis   . Sepsis ([place]) 05/20/2017  . Sinus bradycardia    a. on chronic bb  . Temporal arteritis ([place]) 2011   a. followed @ [facility]; potential flareup 10/28/2012/notes 10/28/2012  . Vocal cord dysfunction    "they don't operate properly"  (10/28/2012)   Past Surgical History:  Procedure Laterality Date  . ABDOMINAL HYSTERECTOMY  ~ 1984   vaginally  . BACK SURGERY  7-62yrs ago   X Stop  . BLADDER SUSPENSION  2001  . BREAST BIOPSY Right   . CATARACT EXTRACTION W/ INTRAOCULAR LENS IMPLANT Right ~ 08/2012  . CHEST TUBE INSERTION Left 03/08/2019   Procedure: Chest Tube Insertion;  Surgeon: Ivin Poot, MD;  Location: [place];  Service: Thoracic;  Laterality: Left;  . COLONOSCOPY  01/26/2012   Procedure: COLONOSCOPY;  Surgeon: Ladene Artist, MD,FACG;  Location: [facility] ENDOSCOPY;  Service: Endoscopy;  Laterality: N/A;  note the EGD is possible  . CORONARY ANGIOPLASTY WITH STENT PLACEMENT  2006   X 1 stent  . EPIDURAL BLOCK INJECTION    . ESOPHAGOGASTRODUODENOSCOPY  01/26/2012   Procedure: ESOPHAGOGASTRODUODENOSCOPY (EGD);  Surgeon: Ladene Artist, MD,FACG;  Location: [facility] ENDOSCOPY;  Service: Endoscopy;  Laterality: N/A;  . HEMIARTHROPLASTY HIP Right 2012  . IR EPIDUROGRAPHY  04/20/2019  . LAPAROSCOPIC CHOLECYSTECTOMY  2001  . LUMBAR FUSION  03/2013  . LUMBAR LAMINECTOMY/DECOMPRESSION MICRODISCECTOMY Right 11/04/2015   Procedure: Right Lumbar Five-Sacral One Microdiskectomy;  Surgeon: Kristeen Miss, MD;  Location: [place] NEURO ORS;  Service: Neurosurgery;  Laterality: Right;  Right L5-S1 Microdiskectomy  . moles removed that required stiches     one on leg and one on face  . SUBXYPHOID PERICARDIAL WINDOW N/A 03/08/2019   Procedure: SUBXYPHOID PERICARDIAL WINDOW;  Surgeon: Ivin Poot, MD;  Location: [place];  Service: Thoracic;  Laterality: N/A;  . TEE WITHOUT CARDIOVERSION N/A 03/08/2019   Procedure: TRANSESOPHAGEAL ECHOCARDIOGRAM (TEE);  Surgeon: Prescott Gum, Collier Salina, MD;  Location: [place];  Service: Thoracic;  Laterality: N/A;  . TEMPORAL ARTERY BIOPSY / LIGATION Bilateral 2011  . TONSILLECTOMY AND ADENOIDECTOMY     at age 85  . TUBAL LIGATION  ~ 1982  . X-STOP IMPLANTATION  ~ 2010   "lower back" (10/28/2012)    reports that she has  never smoked. She has never used smokeless tobacco. She reports current alcohol use. She reports that she does not use drugs. family history includes Brain cancer (age of onset: 43) in her mother; Breast cancer (age of onset: 45) in her daughter; Heart attack in her father; Heart disease in an other family member; Hypertension in an other family member. Allergies  Allergen Reactions  . Dextromethorphan Rash     Review of Systems  Constitutional: Positive for fatigue. Negative for chills and fever.  Respiratory: Negative for cough.   Cardiovascular: Negative for chest pain and leg swelling.  Gastrointestinal: Negative for abdominal pain, nausea and vomiting.  Genitourinary: Negative for dysuria.  Musculoskeletal: Positive for back pain.  Neurological: Positive for weakness.  Psychiatric/Behavioral: Negative for confusion.       Objective:   Physical Exam Vitals reviewed.  Constitutional:      Appearance: Normal appearance.  Cardiovascular:     Rate and Rhythm: Normal rate  and regular rhythm.  Pulmonary:     Effort: Pulmonary effort is normal.     Breath sounds: Normal breath sounds.  Musculoskeletal:     Right lower leg: No edema.     Left lower leg: No edema.  Neurological:     Mental Status: She is alert.        Assessment:     #1 chronic prednisone use with fractures following recent falls.  Prior evidence of advanced demineralization lumbar vertebrae by previous lumbar CT 08/19/18.  Recent ischium fracture.  #2 chronic back pain with known multilevel stenosis  #3 hx of recurrent falls  #4 chronic atrial fibrillation- on Eliquis and meds for rate control.  Taken off Amiodarone  #5 Hx of RA  #6 hx of temporal arteritis followed by rheumatology.    Plan:     -continue with home PT.   -walker use at home to reduce risk of falls -discussed possible use of Fosamax 70 mg po weekly.  Reviewed proper use and discussed possible side effects -discuss getting DEXA at  next follow up.  Eulas Post MD [facility] at [facility]

## 2019-04-28 NOTE — Patient Instructions (Signed)
Osteoporosis  Osteoporosis is thinning and loss of density in your bones. Osteoporosis makes bones more brittle and fragile and more likely to break (fracture). Over time, osteoporosis can cause your bones to become so weak that they fracture after a minor fall. Bones in the hip, wrist, and spine are most likely to fracture due to osteoporosis. What are the causes? The exact cause of this condition is not known. What increases the risk? You may be at greater risk for osteoporosis if you:  Have a family history of the condition.  Have poor nutrition.  Use steroid medicines, such as prednisone.  Are female.  Are age 54 or older.  Smoke or have a history of smoking.  Are not physically active (are sedentary).  Are white (Caucasian) or of Asian descent.  Have a small body frame.  Take certain medicines, such as antiseizure medicines. What are the signs or symptoms? A fracture might be the first sign of osteoporosis, especially if the fracture results from a fall or injury that usually would not cause a bone to break. Other signs and symptoms include:  Pain in the neck or low back.  Stooped posture.  Loss of height. How is this diagnosed? This condition may be diagnosed based on:  Your medical history.  A physical exam.  A bone mineral density test, also called a DXA or DEXA test (dual-energy X-ray absorptiometry test). This test uses X-rays to measure the amount of minerals in your bones. How is this treated? The goal of treatment is to strengthen your bones and lower your risk for a fracture. Treatment may involve:  Making lifestyle changes, such as: ? Including foods with more calcium and vitamin D in your diet. ? Doing weight-bearing and muscle-strengthening exercises. ? Stopping tobacco use. ? Limiting alcohol intake.  Taking medicine to slow the process of bone loss or to increase bone density.  Taking daily supplements of calcium and vitamin D.  Taking  hormone replacement medicines, such as estrogen for women and testosterone for men.  Monitoring your levels of calcium and vitamin D. Follow these instructions at home:  Activity  Exercise as told by your health care provider. Ask your health care provider what exercises and activities are safe for you. You should do: ? Exercises that make you work against gravity (weight-bearing exercises), such as tai chi, yoga, or walking. ? Exercises to strengthen muscles, such as lifting weights. Lifestyle  Limit alcohol intake to no more than 1 drink a day for nonpregnant women and 2 drinks a day for men. One drink equals 12 oz of beer, 5 oz of wine, or 1 oz of hard liquor.  Do not use any products that contain nicotine or tobacco, such as cigarettes and e-cigarettes. If you need help quitting, ask your health care provider. Preventing falls  Use devices to help you move around (mobility aids) as needed, such as canes, walkers, scooters, or crutches.  Keep rooms well-lit and clutter-free.  Remove tripping hazards from walkways, including cords and throw rugs.  Install grab bars in bathrooms and safety rails on stairs.  Use rubber mats in the bathroom and other areas that are often wet or slippery.  Wear closed-toe shoes that fit well and support your feet. Wear shoes that have rubber soles or low heels.  Review your medicines with your health care provider. Some medicines can cause dizziness or changes in blood pressure, which can increase your risk of falling. General instructions  Include calcium and vitamin D in  your diet. Calcium is important for bone health, and vitamin D helps your body to absorb calcium. Good sources of calcium and vitamin D include: ? Certain fatty fish, such as salmon and tuna. ? Products that have calcium and vitamin D added to them (fortified products), such as fortified cereals. ? Egg yolks. ? Cheese. ? Liver.  Take over-the-counter and prescription medicines  only as told by your health care provider.  Keep all follow-up visits as told by your health care provider. This is important. Contact a health care provider if:  You have never been screened for osteoporosis and you are: ? A woman who is age 1 or older. ? A man who is age 36 or older. Get help right away if:  You fall or injure yourself. Summary  Osteoporosis is thinning and loss of density in your bones. This makes bones more brittle and fragile and more likely to break (fracture),even with minor falls.  The goal of treatment is to strengthen your bones and reduce your risk for a fracture.  Include calcium and vitamin D in your diet. Calcium is important for bone health, and vitamin D helps your body to absorb calcium.  Talk with your health care provider about screening for osteoporosis if you are a woman who is age 41 or older, or a man who is age 24 or older. This information is not intended to replace advice given to you by your health care provider. Make sure you discuss any questions you have with your health care provider. Document Revised: 02/26/2017 Document Reviewed: 01/08/2017 Elsevier Patient Education  2020 [place] with rheumatologist to see what labs, if any, that she wants and we will order these.

## 2019-04-30 ENCOUNTER — Encounter: Payer: Self-pay | Admitting: Family Medicine

## 2019-04-30 DIAGNOSIS — R296 Repeated falls: Secondary | ICD-10-CM | POA: Insufficient documentation

## 2019-05-01 ENCOUNTER — Encounter: Payer: Self-pay | Admitting: Family Medicine

## 2019-05-02 DIAGNOSIS — I5032 Chronic diastolic (congestive) heart failure: Secondary | ICD-10-CM | POA: Diagnosis not present

## 2019-05-02 DIAGNOSIS — I130 Hypertensive heart and chronic kidney disease with heart failure and stage 1 through stage 4 chronic kidney disease, or unspecified chronic kidney disease: Secondary | ICD-10-CM | POA: Diagnosis not present

## 2019-05-02 DIAGNOSIS — N183 Chronic kidney disease, stage 3 unspecified: Secondary | ICD-10-CM | POA: Diagnosis not present

## 2019-05-02 DIAGNOSIS — D631 Anemia in chronic kidney disease: Secondary | ICD-10-CM | POA: Diagnosis not present

## 2019-05-02 DIAGNOSIS — I0981 Rheumatic heart failure: Secondary | ICD-10-CM | POA: Diagnosis not present

## 2019-05-04 DIAGNOSIS — I5032 Chronic diastolic (congestive) heart failure: Secondary | ICD-10-CM | POA: Diagnosis not present

## 2019-05-04 DIAGNOSIS — N183 Chronic kidney disease, stage 3 unspecified: Secondary | ICD-10-CM | POA: Diagnosis not present

## 2019-05-04 DIAGNOSIS — I130 Hypertensive heart and chronic kidney disease with heart failure and stage 1 through stage 4 chronic kidney disease, or unspecified chronic kidney disease: Secondary | ICD-10-CM | POA: Diagnosis not present

## 2019-05-04 DIAGNOSIS — D631 Anemia in chronic kidney disease: Secondary | ICD-10-CM | POA: Diagnosis not present

## 2019-05-04 DIAGNOSIS — I0981 Rheumatic heart failure: Secondary | ICD-10-CM | POA: Diagnosis not present

## 2019-05-05 DIAGNOSIS — I313 Pericardial effusion (noninflammatory): Secondary | ICD-10-CM | POA: Diagnosis not present

## 2019-05-08 ENCOUNTER — Encounter: Payer: Self-pay | Admitting: Family Medicine

## 2019-05-09 DIAGNOSIS — I130 Hypertensive heart and chronic kidney disease with heart failure and stage 1 through stage 4 chronic kidney disease, or unspecified chronic kidney disease: Secondary | ICD-10-CM | POA: Diagnosis not present

## 2019-05-09 DIAGNOSIS — I0981 Rheumatic heart failure: Secondary | ICD-10-CM | POA: Diagnosis not present

## 2019-05-09 DIAGNOSIS — I5032 Chronic diastolic (congestive) heart failure: Secondary | ICD-10-CM | POA: Diagnosis not present

## 2019-05-09 DIAGNOSIS — D631 Anemia in chronic kidney disease: Secondary | ICD-10-CM | POA: Diagnosis not present

## 2019-05-09 DIAGNOSIS — N183 Chronic kidney disease, stage 3 unspecified: Secondary | ICD-10-CM | POA: Diagnosis not present

## 2019-05-10 DIAGNOSIS — I0981 Rheumatic heart failure: Secondary | ICD-10-CM | POA: Diagnosis not present

## 2019-05-10 DIAGNOSIS — D631 Anemia in chronic kidney disease: Secondary | ICD-10-CM | POA: Diagnosis not present

## 2019-05-10 DIAGNOSIS — I5032 Chronic diastolic (congestive) heart failure: Secondary | ICD-10-CM | POA: Diagnosis not present

## 2019-05-10 DIAGNOSIS — N183 Chronic kidney disease, stage 3 unspecified: Secondary | ICD-10-CM | POA: Diagnosis not present

## 2019-05-10 DIAGNOSIS — I130 Hypertensive heart and chronic kidney disease with heart failure and stage 1 through stage 4 chronic kidney disease, or unspecified chronic kidney disease: Secondary | ICD-10-CM | POA: Diagnosis not present

## 2019-05-12 ENCOUNTER — Encounter: Payer: Self-pay | Admitting: Family Medicine

## 2019-05-12 ENCOUNTER — Ambulatory Visit (INDEPENDENT_AMBULATORY_CARE_PROVIDER_SITE_OTHER): Payer: Medicare Other | Admitting: Family Medicine

## 2019-05-12 ENCOUNTER — Other Ambulatory Visit: Payer: Self-pay

## 2019-05-12 VITALS — BP 114/60 | HR 61 | Temp 97.7°F | Ht 64.0 in | Wt 150.6 lb

## 2019-05-12 DIAGNOSIS — M79604 Pain in right leg: Secondary | ICD-10-CM | POA: Diagnosis not present

## 2019-05-12 DIAGNOSIS — N183 Chronic kidney disease, stage 3 unspecified: Secondary | ICD-10-CM | POA: Diagnosis not present

## 2019-05-12 DIAGNOSIS — M79605 Pain in left leg: Secondary | ICD-10-CM

## 2019-05-12 DIAGNOSIS — I5032 Chronic diastolic (congestive) heart failure: Secondary | ICD-10-CM | POA: Diagnosis not present

## 2019-05-12 DIAGNOSIS — D631 Anemia in chronic kidney disease: Secondary | ICD-10-CM | POA: Diagnosis not present

## 2019-05-12 DIAGNOSIS — I130 Hypertensive heart and chronic kidney disease with heart failure and stage 1 through stage 4 chronic kidney disease, or unspecified chronic kidney disease: Secondary | ICD-10-CM | POA: Diagnosis not present

## 2019-05-12 DIAGNOSIS — R1084 Generalized abdominal pain: Secondary | ICD-10-CM

## 2019-05-12 DIAGNOSIS — R1110 Vomiting, unspecified: Secondary | ICD-10-CM | POA: Diagnosis not present

## 2019-05-12 DIAGNOSIS — I0981 Rheumatic heart failure: Secondary | ICD-10-CM | POA: Diagnosis not present

## 2019-05-12 NOTE — Progress Notes (Signed)
Subjective:     Patient ID: Betty Jackson, female   DOB: [birth year], 85 y.o.   MRN: [id number]  HPI   Maizee is here with her daughter [name] to discuss some recent bilateral leg pain.  She has had multiple recent admissions..  Refer to previous notes for details.  She does have chronic lumbar back pain with multilevel stenosis.  History of recurrent falls.  Recent ischium fracture on the left.  They had called with bilateral leg pain and some discoloration of both legs.  They state that her legs were somewhat purplish in color and very swollen.  She is on chronic anticoagulation with Eliquis with history of A. fib.  Her pain is at rest.  Apparently someone in the family suggested that her leg pain could be coming from her statin and she stopped Crestor just a few days ago.  She states within a day her pain had improved.  Her swelling is also better.  She has no leg pain at this time.  She is not describing any recent claudication type symptoms though she is very sedentary.  She has had some vague persistent diffuse lower abdominal pains and family relates about once a week after eating she has vomiting.  This does not seem to be particularly following heavy or high-fat meals.  She does not have any history of known bowel obstruction.  She is on chronic Protonix 40 mg daily.  She is not describing any clear consistent gastroparesis type symptoms.  She has had previous cholecystectomy.  She had extensive imaging on recent admission including CT abdomen pelvis as well as ultrasound abdomen which did not show any acute findings.  CT scan did show question of prior small nondisplaced fracture left ischium with possible aggressive lytic type process with periostitis.  Subsequent MRI showed avulsion type fracture with more likely aggressive pattern of callus formation than actual bone lesion causing the periostitis.  There was no soft tissue mass noted.  She denies any pain in this region currently.  No recent  reported stool changes.   Wt Readings from Last 3 Encounters:  05/12/19 150 lb 9.6 oz (68.3 kg)  04/28/19 152 lb 9.6 oz (69.2 kg)  04/15/19 155 lb (70.3 kg)     Past Medical History:  Diagnosis Date  . Abdominal pain, epigastric 06/22/2013  . Abdominal pain, other specified site 12/24/2011  . Abnormal urinalysis 05/20/2017  . Acute respiratory failure with hypoxia ([place]) 05/20/2017  . Anemia, unspecified 10/28/2012  . Anxiety state, unspecified 10/28/2012  . Bruises easily    d/t being on prednisone and plavix  . CAD (coronary artery disease)    a. Stent RCA in [facility];  b. Cath approx 2009 - nonobs per pt report.  . Cataract    immature on the left eye  . Chronic insomnia 02/07/2013  . Chronic lower back pain    scoliosis  . CKD (chronic kidney disease) stage 3, GFR 30-59 ml/min   . Complication of anesthesia    pt has a very high tolerance to meds  . Coronary disease 05/20/2017  . CVA (cerebral infarction) 10/29/2012  . DDD (degenerative disc disease)   . Depression   . Diverticulitis    hx of  . Diverticulosis   . Elevated transaminase level 02/07/2013  . Enteritis   . GERD (gastroesophageal reflux disease) 09/01/2010  . Giant cell arteritis ([place])   . Hemorrhoids   . Herniated nucleus pulposus, L5-S1, right 11/04/2015  . History of scabies   .  HTN (hypertension) 05/20/2017  . Hyperlipidemia    takes Lipitor daily  . Hypertension    takes Amlodipine,Losartan,Metoprolol,and HCTZ daily  . Incontinence of urine   . Influenza A 05/20/2017  . Insomnia    takes Restoril nightly  . Lumbar stenosis 04/25/2013  . Major depressive disorder, recurrent episode, moderate ([place]) 07/16/2013  . Migraines    "back in my 20's; none since" (10/28/2012)  . Osteoporosis   . PAF (paroxysmal atrial fibrillation) ([place]) 2011   a. lone epidode in 2011 according to notes.  . Rheumatoid arthritis ([place])   . Scoliosis   . Sepsis ([place]) 05/20/2017  . Sinus bradycardia    a. on chronic bb  .  Temporal arteritis ([place]) 2011   a. followed @ [facility]; potential flareup 10/28/2012/notes 10/28/2012  . Vocal cord dysfunction    "they don't operate properly" (10/28/2012)   Past Surgical History:  Procedure Laterality Date  . ABDOMINAL HYSTERECTOMY  ~ 1984   vaginally  . BACK SURGERY  7-32yrs ago   X Stop  . BLADDER SUSPENSION  2001  . BREAST BIOPSY Right   . CATARACT EXTRACTION W/ INTRAOCULAR LENS IMPLANT Right ~ 08/2012  . CHEST TUBE INSERTION Left 03/08/2019   Procedure: Chest Tube Insertion;  Surgeon: Ivin Poot, MD;  Location: [place];  Service: Thoracic;  Laterality: Left;  . COLONOSCOPY  01/26/2012   Procedure: COLONOSCOPY;  Surgeon: Ladene Artist, MD,FACG;  Location[facility] ENDOSCOPY;  Service: Endoscopy;  Laterality: N/A;  note the EGD is possible  . CORONARY ANGIOPLASTY WITH STENT PLACEMENT  2006   X 1 stent  . EPIDURAL BLOCK INJECTION    . ESOPHAGOGASTRODUODENOSCOPY  01/26/2012   Procedure: ESOPHAGOGASTRODUODENOSCOPY (EGD);  Surgeon: Ladene Artist, MD,FACG;  Location: [facility] ENDOSCOPY;  Service: Endoscopy;  Laterality: N/A;  . HEMIARTHROPLASTY HIP Right 2012  . IR EPIDUROGRAPHY  04/20/2019  . LAPAROSCOPIC CHOLECYSTECTOMY  2001  . LUMBAR FUSION  03/2013  . LUMBAR LAMINECTOMY/DECOMPRESSION MICRODISCECTOMY Right 11/04/2015   Procedure: Right Lumbar Five-Sacral One Microdiskectomy;  Surgeon: Kristeen Miss, MD;  Location: [place] NEURO ORS;  Service: Neurosurgery;  Laterality: Right;  Right L5-S1 Microdiskectomy  . moles removed that required stiches     one on leg and one on face  . SUBXYPHOID PERICARDIAL WINDOW N/A 03/08/2019   Procedure: SUBXYPHOID PERICARDIAL WINDOW;  Surgeon: Ivin Poot, MD;  Location: [place];  Service: Thoracic;  Laterality: N/A;  . TEE WITHOUT CARDIOVERSION N/A 03/08/2019   Procedure: TRANSESOPHAGEAL ECHOCARDIOGRAM (TEE);  Surgeon: Prescott Gum, Collier Salina, MD;  Location: [place];  Service: Thoracic;  Laterality: N/A;  . TEMPORAL ARTERY BIOPSY / LIGATION Bilateral 2011  .  TONSILLECTOMY AND ADENOIDECTOMY     at age 89  . TUBAL LIGATION  ~ 1982  . X-STOP IMPLANTATION  ~ 2010   "lower back" (10/28/2012)    reports that she has never smoked. She has never used smokeless tobacco. She reports current alcohol use. She reports that she does not use drugs. family history includes Brain cancer (age of onset: 24) in her mother; Breast cancer (age of onset: 60) in her daughter; Heart attack in her father; Heart disease in an other family member; Hypertension in an other family member. Allergies  Allergen Reactions  . Dextromethorphan Rash     Review of Systems  Constitutional: Negative for chills and fever.  Respiratory: Negative for cough and shortness of breath.   Cardiovascular: Negative for chest pain and leg swelling.  Gastrointestinal: Positive for abdominal pain, nausea and  vomiting. Negative for blood in stool and diarrhea.  Genitourinary: Negative for dysuria.  Neurological: Negative for headaches.  Psychiatric/Behavioral: Negative for confusion.       Objective:   Physical Exam Vitals reviewed.  Constitutional:      Appearance: Normal appearance.  Cardiovascular:     Rate and Rhythm: Normal rate.     Comments: Feet- left slightly cooler than right.  Trace DP pulse bilaterally.   She has fairly good cap refill in both feet.   Pulmonary:     Effort: Pulmonary effort is normal.     Breath sounds: Normal breath sounds.  Musculoskeletal:     Right lower leg: No edema.     Left lower leg: No edema.  Skin:    Findings: No rash.  Neurological:     Mental Status: She is alert.        Assessment:     #1 recent bilateral leg pain- improved after discontinuation of statin.  She had also complained of some recent bilateral leg edema and improved at this time  #2  Recurrent/intermittent poorly localized abdominal discomfort with intermittent nausea and vomiting.    Plan:     - leave off her statin for another 1-2 weeks and then try again.  If she  has recurrent pain with repeat trial will consider trial with another statin  - will set up GI referral per pt request for intermittent vomiting.  Recent CT and ultrasound studies reviewed with patient.  Eulas Post MD [facility] at [facility]

## 2019-05-12 NOTE — Patient Instructions (Addendum)
Leave off the Crestor for another week and then if leg pain doing good give one more trial of going back on the Crestor.    Let me know if leg pain recurs on the Crestor.

## 2019-05-13 ENCOUNTER — Other Ambulatory Visit: Payer: Self-pay | Admitting: Cardiovascular Disease

## 2019-05-15 NOTE — Telephone Encounter (Signed)
Eliquis 5mg  refill request received, pt is 85yrs old, weight-68.3kg, Crea-1.27 on 04/19/2019, Diagnosis-Afib, and last seen by Clint PA on 01/23/2019. Dose is appropriate based on dosing criteria. Will send in refill to requested pharmacy.

## 2019-05-16 DIAGNOSIS — I5032 Chronic diastolic (congestive) heart failure: Secondary | ICD-10-CM | POA: Diagnosis not present

## 2019-05-16 DIAGNOSIS — I130 Hypertensive heart and chronic kidney disease with heart failure and stage 1 through stage 4 chronic kidney disease, or unspecified chronic kidney disease: Secondary | ICD-10-CM | POA: Diagnosis not present

## 2019-05-16 DIAGNOSIS — D631 Anemia in chronic kidney disease: Secondary | ICD-10-CM | POA: Diagnosis not present

## 2019-05-16 DIAGNOSIS — I0981 Rheumatic heart failure: Secondary | ICD-10-CM | POA: Diagnosis not present

## 2019-05-16 DIAGNOSIS — N183 Chronic kidney disease, stage 3 unspecified: Secondary | ICD-10-CM | POA: Diagnosis not present

## 2019-05-19 ENCOUNTER — Other Ambulatory Visit: Payer: Self-pay

## 2019-05-19 DIAGNOSIS — R1110 Vomiting, unspecified: Secondary | ICD-10-CM

## 2019-05-25 ENCOUNTER — Ambulatory Visit: Payer: Medicare Other | Admitting: Cardiovascular Disease

## 2019-05-25 ENCOUNTER — Emergency Department (HOSPITAL_COMMUNITY)
Admission: EM | Admit: 2019-05-25 | Discharge: 2019-05-25 | Disposition: A | Discharge status: Home / Self Care | Payer: Medicare Other | Attending: Emergency Medicine | Admitting: Emergency Medicine

## 2019-05-25 ENCOUNTER — Emergency Department (HOSPITAL_COMMUNITY): Payer: Medicare Other

## 2019-05-25 ENCOUNTER — Encounter: Payer: Self-pay | Admitting: Cardiovascular Disease

## 2019-05-25 ENCOUNTER — Other Ambulatory Visit: Payer: Self-pay

## 2019-05-25 VITALS — BP 108/60 | HR 56 | Ht 64.0 in | Wt 152.2 lb

## 2019-05-25 DIAGNOSIS — Z743 Need for continuous supervision: Secondary | ICD-10-CM | POA: Diagnosis not present

## 2019-05-25 DIAGNOSIS — R55 Syncope and collapse: Secondary | ICD-10-CM | POA: Diagnosis not present

## 2019-05-25 DIAGNOSIS — I4891 Unspecified atrial fibrillation: Secondary | ICD-10-CM | POA: Diagnosis not present

## 2019-05-25 DIAGNOSIS — I10 Essential (primary) hypertension: Secondary | ICD-10-CM | POA: Diagnosis not present

## 2019-05-25 DIAGNOSIS — Z79899 Other long term (current) drug therapy: Secondary | ICD-10-CM | POA: Diagnosis not present

## 2019-05-25 DIAGNOSIS — I639 Cerebral infarction, unspecified: Secondary | ICD-10-CM | POA: Insufficient documentation

## 2019-05-25 DIAGNOSIS — I25118 Atherosclerotic heart disease of native coronary artery with other forms of angina pectoris: Secondary | ICD-10-CM

## 2019-05-25 DIAGNOSIS — I480 Paroxysmal atrial fibrillation: Secondary | ICD-10-CM | POA: Diagnosis not present

## 2019-05-25 DIAGNOSIS — I1310 Hypertensive heart and chronic kidney disease without heart failure, with stage 1 through stage 4 chronic kidney disease, or unspecified chronic kidney disease: Secondary | ICD-10-CM | POA: Insufficient documentation

## 2019-05-25 DIAGNOSIS — I2510 Atherosclerotic heart disease of native coronary artery without angina pectoris: Secondary | ICD-10-CM | POA: Insufficient documentation

## 2019-05-25 DIAGNOSIS — N183 Chronic kidney disease, stage 3 unspecified: Secondary | ICD-10-CM | POA: Diagnosis not present

## 2019-05-25 DIAGNOSIS — R42 Dizziness and giddiness: Secondary | ICD-10-CM | POA: Diagnosis not present

## 2019-05-25 DIAGNOSIS — E782 Mixed hyperlipidemia: Secondary | ICD-10-CM | POA: Diagnosis not present

## 2019-05-25 DIAGNOSIS — R0902 Hypoxemia: Secondary | ICD-10-CM | POA: Diagnosis not present

## 2019-05-25 DIAGNOSIS — R404 Transient alteration of awareness: Secondary | ICD-10-CM | POA: Diagnosis not present

## 2019-05-25 DIAGNOSIS — I5033 Acute on chronic diastolic (congestive) heart failure: Secondary | ICD-10-CM

## 2019-05-25 DIAGNOSIS — R410 Disorientation, unspecified: Secondary | ICD-10-CM | POA: Diagnosis not present

## 2019-05-25 DIAGNOSIS — R001 Bradycardia, unspecified: Secondary | ICD-10-CM | POA: Diagnosis not present

## 2019-05-25 LAB — BASIC METABOLIC PANEL
Anion gap: 11 (ref 5–15)
BUN: 25 mg/dL — ABNORMAL HIGH (ref 8–23)
CO2: 21 mmol/L — ABNORMAL LOW (ref 22–32)
Calcium: 8.3 mg/dL — ABNORMAL LOW (ref 8.9–10.3)
Chloride: 105 mmol/L (ref 98–111)
Creatinine, Ser: 1.18 mg/dL — ABNORMAL HIGH (ref 0.44–1.00)
GFR calc Af Amer: 50 mL/min — ABNORMAL LOW (ref >60)
GFR calc non Af Amer: 43 mL/min — ABNORMAL LOW (ref >60)
Glucose, Bld: 109 mg/dL — ABNORMAL HIGH (ref 70–99)
Potassium: 4.3 mmol/L (ref 3.5–5.1)
Sodium: 137 mmol/L (ref 135–145)

## 2019-05-25 LAB — URINALYSIS, ROUTINE W REFLEX MICROSCOPIC
Bilirubin Urine: NEGATIVE
Glucose, UA: NEGATIVE mg/dL
Hgb urine dipstick: NEGATIVE
Ketones, ur: NEGATIVE mg/dL
Leukocytes,Ua: NEGATIVE
Nitrite: NEGATIVE
Protein, ur: NEGATIVE mg/dL
Specific Gravity, Urine: 1.004 — ABNORMAL LOW (ref 1.005–1.030)
pH: 6 (ref 5.0–8.0)

## 2019-05-25 LAB — CBC
HCT: 35 % — ABNORMAL LOW (ref 36.0–46.0)
Hemoglobin: 10.9 g/dL — ABNORMAL LOW (ref 12.0–15.0)
MCH: 29.1 pg (ref 26.0–34.0)
MCHC: 31.1 g/dL (ref 30.0–36.0)
MCV: 93.6 fL (ref 80.0–100.0)
Platelets: 189 10*3/uL (ref 150–400)
RBC: 3.74 MIL/uL — ABNORMAL LOW (ref 3.87–5.11)
RDW: 15.4 % (ref 11.5–15.5)
WBC: 12.1 10*3/uL — ABNORMAL HIGH (ref 4.0–10.5)
nRBC: 0 % (ref 0.0–0.2)

## 2019-05-25 LAB — CBG MONITORING, ED: Glucose-Capillary: 103 mg/dL — ABNORMAL HIGH (ref 70–99)

## 2019-05-25 MED ORDER — LACTATED RINGERS IV BOLUS
500.0000 mL | Freq: Once | INTRAVENOUS | Status: AC
  Administered 2019-05-25: 500 mL via INTRAVENOUS

## 2019-05-25 MED ORDER — SODIUM CHLORIDE 0.9% FLUSH: 3.0000 mL | Freq: Once | INTRAVENOUS | Status: DC

## 2019-05-25 MED ORDER — DILTIAZEM HCL ER COATED BEADS 180 MG PO CP24: 180.0000 mg | ORAL_CAPSULE | Freq: Every day | ORAL | 3 refills | Status: DC

## 2019-05-25 NOTE — ED Provider Notes (Signed)
[facility] EMERGENCY DEPARTMENT Provider Note   CSN: [id number] Arrival date & time: 05/25/19  1859     History Chief Complaint  Patient presents with  . Hypotension  . Near Syncope    Betty Jackson is a 85 y.o. female.  The history is provided by the patient, medical records and the EMS personnel.   The patient is an 85 year old female with a past medical history of coronary artery disease, chronic kidney disease, CVA, atrial fibrillation on Eliquis Cardizem and metoprolol who presents to the ED for lightheadedness and near syncope.  Patient was standing up to walk in from her patio today and became lightheaded, family was with her and caught her, she did not fall, she did not have syncopal episode, she did not have any head injury or loss of consciousness.  Reportedly her family took her blood pressure at home and the systolic blood pressure was in the 60s so EMS was called and the patient was brought here.  Symptoms were moderate, symptoms had sudden onset, symptoms are improved, symptoms better with sitting down, symptoms worse with standing.  On arrival the patient is mildly bradycardic and normotensive, awake and alert, reports her symptoms have resolved.  She denies any shortness of breath, chest pain, nausea, vomiting, diarrhea, fever, chills, cough, dysuria, frequency.     Past Medical History:  Diagnosis Date  . Abdominal pain, epigastric 06/22/2013  . Abdominal pain, other specified site 12/24/2011  . Abnormal urinalysis 05/20/2017  . Acute respiratory failure with hypoxia ([place]) 05/20/2017  . Anemia, unspecified 10/28/2012  . Anxiety state, unspecified 10/28/2012  . Bruises easily    d/t being on prednisone and plavix  . CAD (coronary artery disease)    a. Stent RCA in [facility];  b. Cath approx 2009 - nonobs per pt report.  . Cataract    immature on the left eye  . Chronic insomnia 02/07/2013  . Chronic lower back pain    scoliosis  . CKD (chronic  kidney disease) stage 3, GFR 30-59 ml/min   . Complication of anesthesia    pt has a very high tolerance to meds  . Coronary disease 05/20/2017  . CVA (cerebral infarction) 10/29/2012  . DDD (degenerative disc disease)   . Depression   . Diverticulitis    hx of  . Diverticulosis   . Elevated transaminase level 02/07/2013  . Enteritis   . GERD (gastroesophageal reflux disease) 09/01/2010  . Giant cell arteritis ([place])   . Hemorrhoids   . Herniated nucleus pulposus, L5-S1, right 11/04/2015  . History of scabies   . HTN (hypertension) 05/20/2017  . Hyperlipidemia    takes Lipitor daily  . Hypertension    takes Amlodipine,Losartan,Metoprolol,and HCTZ daily  . Incontinence of urine   . Influenza A 05/20/2017  . Insomnia    takes Restoril nightly  . Lumbar stenosis 04/25/2013  . Major depressive disorder, recurrent episode, moderate ([place]) 07/16/2013  . Migraines    "back in my 20's; none since" (10/28/2012)  . Osteoporosis   . PAF (paroxysmal atrial fibrillation) ([place]) 2011   a. lone epidode in 2011 according to notes.  . Rheumatoid arthritis ([place])   . Scoliosis   . Sepsis ([place]) 05/20/2017  . Sinus bradycardia    a. on chronic bb  . Temporal arteritis ([place]) 2011   a. followed @ [facility]; potential flareup 10/28/2012/notes 10/28/2012  . Vocal cord dysfunction    "they don't operate properly" (10/28/2012)    Patient Active  Problem List   Diagnosis Date Noted  . Recurrent falls 04/30/2019  . Pericardial effusion without cardiac tamponade 03/29/2019  . Hypoalbuminemia 03/27/2019  . Cardiac tamponade   . Pericarditis 03/06/2019  . Fever   . Septic shock ([place])   . Atypical chest pain   . Atrial fibrillation with rapid ventricular response ([place] )   . Nausea 01/17/2019  . Acute diverticulitis 01/13/2019  . AKI (acute kidney injury) ([place]) 01/13/2019  . Chronic pain 01/13/2019  . Tachycardia 01/13/2019  . Hypokalemia 01/13/2019  . Chronic low back pain 07/10/2018  . Influenza A 05/20/2017  .  Sepsis ([place]) 05/20/2017  . Acute respiratory failure with hypoxia ([place]) 05/20/2017  . CKD (chronic kidney disease) stage 3, GFR 30-59 ml/min 05/20/2017  . PAF (paroxysmal atrial fibrillation) ([place]) 05/20/2017  . HTN (hypertension) 05/20/2017  . Giant cell arteritis ([place]) 05/20/2017  . Coronary disease 05/20/2017  . Abnormal urinalysis 05/20/2017  . Osteoporosis 05/08/2016  . Herniated nucleus pulposus, L5-S1, right 11/04/2015  . Major depressive disorder, recurrent episode, moderate ([place]) 07/16/2013  . Abdominal pain, epigastric 06/22/2013  . Lumbar stenosis 04/25/2013  . Chronic insomnia 02/07/2013  . Elevated transaminase level 02/07/2013  . CVA (cerebral infarction) 10/29/2012  . Visual changes 10/28/2012  . Depression 10/28/2012  . Anxiety state, unspecified 10/28/2012  . Anemia, unspecified 10/28/2012  . Nonspecific (abnormal) findings on radiological and other examination of gastrointestinal tract 01/25/2012  . Hyponatremia 01/24/2012  . Hematuria 01/24/2012  . Enteritis 12/24/2011  . Abdominal pain, other specified site 12/24/2011  . Leg pain, right 02/18/2011  . Temporal arteritis ([place]) 09/01/2010  . Hypertension 09/01/2010  . Hyperlipidemia 09/01/2010  . History of atrial fibrillation 09/01/2010  . CAD (coronary artery disease) 09/01/2010  . GERD (gastroesophageal reflux disease) 09/01/2010  . Insomnia 09/01/2010    Past Surgical History:  Procedure Laterality Date  . ABDOMINAL HYSTERECTOMY  ~ 1984   vaginally  . BACK SURGERY  7-70yrs ago   X Stop  . BLADDER SUSPENSION  2001  . BREAST BIOPSY Right   . CATARACT EXTRACTION W/ INTRAOCULAR LENS IMPLANT Right ~ 08/2012  . CHEST TUBE INSERTION Left 03/08/2019   Procedure: Chest Tube Insertion;  Surgeon: Ivin Poot, MD;  Location: [facility];  Service: Thoracic;  Laterality: Left;  . COLONOSCOPY  01/26/2012   Procedure: COLONOSCOPY;  Surgeon: Ladene Artist, MD,FACG;  Location: [facility] ENDOSCOPY;  Service: Endoscopy;   Laterality: N/A;  note the EGD is possible  . CORONARY ANGIOPLASTY WITH STENT PLACEMENT  2006   X 1 stent  . EPIDURAL BLOCK INJECTION    . ESOPHAGOGASTRODUODENOSCOPY  01/26/2012   Procedure: ESOPHAGOGASTRODUODENOSCOPY (EGD);  Surgeon: Ladene Artist, MD,FACG;  Location: [facility] ENDOSCOPY;  Service: Endoscopy;  Laterality: N/A;  . HEMIARTHROPLASTY HIP Right 2012  . IR EPIDUROGRAPHY  04/20/2019  . LAPAROSCOPIC CHOLECYSTECTOMY  2001  . LUMBAR FUSION  03/2013  . LUMBAR LAMINECTOMY/DECOMPRESSION MICRODISCECTOMY Right 11/04/2015   Procedure: Right Lumbar Five-Sacral One Microdiskectomy;  Surgeon: Kristeen Miss, MD;  Location: [facility] NEURO ORS;  Service: Neurosurgery;  Laterality: Right;  Right L5-S1 Microdiskectomy  . moles removed that required stiches     one on leg and one on face  . SUBXYPHOID PERICARDIAL WINDOW N/A 03/08/2019   Procedure: SUBXYPHOID PERICARDIAL WINDOW;  Surgeon: Ivin Poot, MD;  Location: [facility];  Service: Thoracic;  Laterality: N/A;  . TEE WITHOUT CARDIOVERSION N/A 03/08/2019   Procedure: TRANSESOPHAGEAL ECHOCARDIOGRAM (TEE);  Surgeon: Prescott Gum, Collier Salina, MD;  Location: [facility];  Service:  Thoracic;  Laterality: N/A;  . TEMPORAL ARTERY BIOPSY / LIGATION Bilateral 2011  . TONSILLECTOMY AND ADENOIDECTOMY     at age 85  . TUBAL LIGATION  ~ 1982  . X-STOP IMPLANTATION  ~ 2010   "lower back" (10/28/2012)     OB History   No obstetric history on file.     Family History  Problem Relation Age of Onset  . Brain cancer Mother 47  . Heart attack Father   . Breast cancer Daughter 25  . Heart disease Other   . Hypertension Other   . Arthritis Neg Hx   . Colon cancer Neg Hx   . Osteoporosis Neg Hx     Social History   Tobacco Use  . Smoking status: Never Smoker  . Smokeless tobacco: Never Used  Substance Use Topics  . Alcohol use: Yes    Comment: socially  . Drug use: No    Home Medications Prior to Admission medications   Medication Sig Start Date End Date Taking?  Authorizing Provider  alendronate (FOSAMAX) 70 MG tablet Take 1 tablet (70 mg total) by mouth every 7 (seven) days. Take with a full glass of water on an empty stomach. Patient taking differently: Take 70 mg by mouth every Tuesday. Take with a full glass of water on an empty stomach. 04/28/19  Yes Burchette, Alinda Sierras, MD  Cholecalciferol (VITAMIN D-3) 25 MCG (1000 UT) CAPS Take 1,000 Units by mouth daily.   Yes [provider]  diltiazem (CARDIZEM CD) 180 MG 24 hr capsule Take 1 capsule (180 mg total) by mouth daily. 05/25/19  Yes McAlhany, Annita Brod, MD  ELIQUIS 5 MG TABS tablet TAKE 1 TABLET BY MOUTH TWICE A DAY Patient taking differently: Take 5 mg by mouth 2 (two) times daily.  05/15/19  Yes Burnell Blanks, MD  escitalopram (LEXAPRO) 10 MG tablet TAKE 1 TABLET BY MOUTH EVERY DAY Patient taking differently: Take 10 mg by mouth daily.  12/14/18  Yes Burchette, Alinda Sierras, MD  furosemide (LASIX) 20 MG tablet Take 2 tablets (40 mg total) by mouth daily. Patient taking differently: Take 40 mg by mouth 2 (two) times daily.  04/21/19 05/25/19 Yes Oswald Hillock, MD  HYDROcodone-acetaminophen (NORCO/VICODIN) 5-325 MG tablet Take one tablet by mouth every 8 hours as needed for pain. May refill in two months. Patient taking differently: Take 1 tablet by mouth every 6 (six) hours as needed (for back pain).  08/05/18  Yes Burchette, Alinda Sierras, MD  losartan (COZAAR) 100 MG tablet Take 100 mg by mouth daily.   Yes [provider]  metoprolol tartrate (LOPRESSOR) 100 MG tablet Take 1 tablet (100 mg total) by mouth 2 (two) times daily. Please schedule an appt for further refills 1st attempt Patient taking differently: Take 100 mg by mouth 2 (two) times daily.  04/26/19  Yes Burnell Blanks, MD  ondansetron (ZOFRAN ODT) 4 MG disintegrating tablet Take 1 tablet (4 mg total) by mouth every 8 (eight) hours as needed for nausea or vomiting. Patient taking differently: Take 4 mg by mouth every  8 (eight) hours as needed for nausea or vomiting (DISSOLVE IN THE MOUTH).  01/12/19  Yes Gareth Morgan, MD  pantoprazole (PROTONIX) 40 MG tablet TAKE 1 TABLET BY MOUTH EVERY DAY Patient taking differently: Take 40 mg by mouth daily.  02/17/19  Yes Burchette, Alinda Sierras, MD  potassium chloride SA (KLOR-CON) 10 MEQ tablet Take 1 tablet (10 mEq total) by mouth daily. 04/21/19  Yes  Oswald Hillock, MD  predniSONE (DELTASONE) 10 MG tablet Take 1 tablet (10 mg total) by mouth daily. 04/21/19  Yes Oswald Hillock, MD  vitamin C (ASCORBIC ACID) 500 MG tablet Take 1,000 mg by mouth daily.    Yes [provider]  vitamin E (VITAMIN E) 400 UNIT capsule Take 400 Units by mouth daily.   Yes [provider]  dicyclomine (BENTYL) 20 MG tablet Take 1 tablet (20 mg total) by mouth 2 (two) times daily as needed for spasms. Patient not taking: Reported on 05/25/2019 01/12/19   Gareth Morgan, MD  ferrous sulfate 325 (65 FE) MG tablet Take 1 tablet (325 mg total) by mouth 2 (two) times daily with a meal. 04/21/19   Oswald Hillock, MD  lidocaine (XYLOCAINE) 5 % ointment Apply 1 application topically 3 (three) times daily as needed. Patient not taking: Reported on 05/25/2019 04/13/19   Long, Wonda Olds, MD    Allergies    Dextromethorphan  Review of Systems   Review of Systems  Constitutional: Negative for chills and fever.  Respiratory: Negative for cough and shortness of breath.   Cardiovascular: Negative for chest pain.  Gastrointestinal: Negative for abdominal pain, diarrhea, nausea and vomiting.  Genitourinary: Negative for dysuria and frequency.  Neurological: Positive for light-headedness. Negative for syncope, weakness and numbness.  All other systems reviewed and are negative.   Physical Exam Updated Vital Signs BP (!) 141/53 (BP Location: Left Arm)   Pulse (!) 54   Temp (!) 97.5 F (36.4 C) (Oral)   Resp 16   Ht 5\' 5"  (1.651 m)   Wt 66 kg   SpO2 96%   BMI 24.21 kg/m   Physical  Exam Vitals and nursing note reviewed.  Constitutional:      Appearance: She is well-developed. She is not toxic-appearing or diaphoretic.  HENT:     Head: Normocephalic and atraumatic.     Mouth/Throat:     Mouth: Mucous membranes are dry.     Pharynx: Oropharynx is clear.  Eyes:     Extraocular Movements: Extraocular movements intact.     Conjunctiva/sclera: Conjunctivae normal.     Pupils: Pupils are equal, round, and reactive to light.  Cardiovascular:     Rate and Rhythm: Regular rhythm. Bradycardia present.     Pulses: Normal pulses.     Heart sounds: No murmur.  Pulmonary:     Effort: Pulmonary effort is normal. No respiratory distress.     Breath sounds: Normal breath sounds.  Abdominal:     Palpations: Abdomen is soft.     Tenderness: There is no abdominal tenderness. There is no guarding or rebound.  Musculoskeletal:     Cervical back: Neck supple.     Right lower leg: No edema.     Left lower leg: No edema.  Skin:    General: Skin is warm and dry.  Neurological:     General: No focal deficit present.     Mental Status: She is alert and oriented to person, place, and time.  Psychiatric:        Mood and Affect: Mood normal.        Behavior: Behavior normal.     ED Results / Procedures / Treatments   Labs (all labs ordered are listed, but only abnormal results are displayed) Labs Reviewed  BASIC METABOLIC PANEL - Abnormal; Notable for the following components:      Result Value   CO2 21 (*)    Glucose, Bld  109 (*)    BUN 25 (*)    Creatinine, Ser 1.18 (*)    Calcium 8.3 (*)    GFR calc non Af Amer 43 (*)    GFR calc Af Amer 50 (*)    All other components within normal limits  CBC - Abnormal; Notable for the following components:   WBC 12.1 (*)    RBC 3.74 (*)    Hemoglobin 10.9 (*)    HCT 35.0 (*)    All other components within normal limits  URINALYSIS, ROUTINE W REFLEX MICROSCOPIC - Abnormal; Notable for the following components:   Color, Urine  STRAW (*)    Specific Gravity, Urine 1.004 (*)    All other components within normal limits  CBG MONITORING, ED - Abnormal; Notable for the following components:   Glucose-Capillary 103 (*)    All other components within normal limits    EKG EKG Interpretation  Date/Time:  Thursday May 25 2019 19:00:36 EST Ventricular Rate:  50 PR Interval:    QRS Duration: 97 QT Interval:  486 QTC Calculation: 444 R Axis:   5 Text Interpretation: Sinus rhythm Prolonged PR interval Low voltage, precordial leads RSR' in V1 or V2, right VCD or RVH Nonspecific T abnrm, anterolateral leads No acute changes No significant change since last tracing Confirmed by Varney Biles [phone]) on 05/25/2019 7:30:25 PM   Radiology DG Chest Portable 1 View  Result Date: 05/25/2019 CLINICAL DATA:  Lightheaded EXAM: PORTABLE CHEST 1 VIEW COMPARISON:  CT 04/15/2019, radiograph 04/15/2019 FINDINGS: Borderline cardiomegaly. No focal opacity or pleural effusion. Aortic atherosclerosis. No pneumothorax. IMPRESSION: No active disease. Electronically Signed   By: Donavan Foil M.D.   On: 05/25/2019 19:36    Procedures Procedures (including critical care time)  Medications Ordered in ED Medications  lactated ringers bolus 500 mL (0 mLs Intravenous Stopped 05/25/19 2258)    ED Course  I have reviewed the triage vital signs and the nursing notes.  Pertinent labs & imaging results that were available during my care of the patient were reviewed by me and considered in my medical decision making (see chart for details).    MDM Rules/Calculators/A&P                       The patient is an 85 year old female who presented for a near syncopal episode today.  Occurred after standing without any shortness of breath, palpitations, chest pain.  The patient is mildly bradycardic and is on metoprolol as well as Cardizem for her atrial fibrillation, record review reveals that she was bradycardic in the 50s at her cardiology  appointment earlier today and was asymptomatic at that time, do not suspect symptomatic bradycardia as her blood pressure stable.  Record review reveals the patient had a recent echo that was reassuring he did not have any emergent valvular abnormalities, or decreased EF, she did have some diastolic dysfunction.  Her labs showed no significant acute kidney injury, she does have a mild leukocytosis and anemia, her leukocytosis is likely secondary to her ongoing prednisone treatment.  The patient had no head injury or loss of consciousness, she has no headache, nausea, vomiting at this time, no indication for emergent imaging of the brain.  As she had no chest pain and was recently seen by cardiology and has no acute changes on her EKG I have a low suspicion for ACS or acute arrhythmia.  She has no chest pain shortness of breath and is not hypoxic, do  not suspect PE at this time.  Her symptoms were likely secondary to orthostasis as it occurred after standing.  Strict return precautions provided, discharged in stable condition.   Final Clinical Impression(s) / ED Diagnoses Final diagnoses:  Near syncope    Rx / DC Orders ED Discharge Orders    None       Mikylah Ackroyd, [place], MD 05/26/19 [place], Ankit, MD 05/30/19 [phone]

## 2019-05-25 NOTE — Patient Instructions (Addendum)
Medication Instructions:  Your physician has recommended you make the following change in your medication:  1.) stop diltiazem 60 mg 2.) start Cardizem CD 180 mg daily  *If you need a refill on your cardiac medications before your next appointment, please call your pharmacy*  Lab Work: none If you have labs (blood work) drawn today and your tests are completely normal, you will receive your results only by: Marland Kitchen MyChart Message (if you have MyChart) OR . A paper copy in the mail If you have any lab test that is abnormal or we need to change your treatment, we will call you to review the results.  Testing/Procedures: none  Follow-Up: At [facility], you and your health needs are our priority.  As part of our continuing mission to provide you with exceptional heart care, we have created designated Provider Care Teams.  These Care Teams include your primary Cardiologist (physician) and Advanced Practice Providers (APPs -  Physician Assistants and Nurse Practitioners) who all work together to provide you with the care you need, when you need it.  Your next appointment:   6 month(s)  The format for your next appointment:   In Person  Provider:   You may see [name], MD or one of the following Advanced Practice Providers on your designated Care Team:    Melina Copa, PA-C  Ermalinda Barrios, PA-C   Other Instructions

## 2019-05-25 NOTE — ED Triage Notes (Signed)
From home. Pt reports near syncopal episode with some confusion today while walking out to her patio. Family took BP @home  and it systolic was high 0000000. No complaints at this time. Pt A/Ox4 at this time.

## 2019-05-25 NOTE — Progress Notes (Signed)
Chief Complaint  Patient presents with  . Follow-up    CAD   History of Present Illness: 85 yo female with history of paroxysmal atrial fibrillation, PVCs, PACs, CAD s/p stent RCA, HTN, HLD, GERD, temporal arteritis here today for cardiac follow up. She had a stent placed in the RCA in [place], [place] in 2005. She moved to [facility] for treatment of her temporal arteritis at [facility] and to be near family. She had normal ABI in 2012. Mild carotid disease by dopplers 2014. Cardiac cath 12/09/12 with patent RCA stent, mild disease LAD and Circumflex, normal filling pressures. Echo August 2014 with normal LV size and function, no significant valve issues. She was seen in our office 06/19/14 with c/o chest pain. Stress myoview 06/29/14 with no ischemia. She was admitted to [facility] in February 2019 with influenza A, pneumonia and sepsis. Her beta blocker was held and she developed atrial fibrillation with RVR. She also had frequent PVCs and PACs. She converted back to sinus after several hours. Echo February 2019 with normal LV size and function, grade 2 diastolic dysfunction, mild AI. She was not discharged on anticoagulation. She wore a cardiac monitor and had recurrent atrial fib. CHADS VASC score 4. She was seen by Estella Husk, PA 07/06/17 and she was started on Eliquis. I saw her in May 2019 and she c/o chest pain with exertion. Nuclear stress test 08/31/17 with no ischemia. She was having minor nose bleeds on Eliquis. She was referred to ENT but she did not go to that appointment. I saw her 09/20/17 and she c/o weight gain and worsened LE edema. Lasix was increased and Norvasc was stopped. She was admitted to [facility] in December 2020 with a large pericardial effusion and had a sub-xiphoid window per Dr. Prescott Gum. The effusion was culture negative and pathology only showed inflammation. She was admitted to [facility] in January 2021 with a left ischium fracture. No surgery was performed. Troponin was elevated in the setting of possible  sepsis but blood cultures were negative. Cardiology was consulted. Echo January 2021 with normal LV systolic function, no significant valve disease.   She is here today for follow up. The patient denies any chest pain, dyspnea, palpitations, lower extremity edema, orthopnea, PND, dizziness, near syncope or syncope. She stopped her Crestor due to leg pain. She  Is still having abdominal pain. Not feeling well overall.   Primary Care Physician: Eulas Post, MD   Past Medical History:  Diagnosis Date  . Abdominal pain, epigastric 06/22/2013  . Abdominal pain, other specified site 12/24/2011  . Abnormal urinalysis 05/20/2017  . Acute respiratory failure with hypoxia ([place]) 05/20/2017  . Anemia, unspecified 10/28/2012  . Anxiety state, unspecified 10/28/2012  . Bruises easily    d/t being on prednisone and plavix  . CAD (coronary artery disease)    a. Stent RCA in [facility];  b. Cath approx 2009 - nonobs per pt report.  . Cataract    immature on the left eye  . Chronic insomnia 02/07/2013  . Chronic lower back pain    scoliosis  . CKD (chronic kidney disease) stage 3, GFR 30-59 ml/min   . Complication of anesthesia    pt has a very high tolerance to meds  . Coronary disease 05/20/2017  . CVA (cerebral infarction) 10/29/2012  . DDD (degenerative disc disease)   . Depression   . Diverticulitis    hx of  . Diverticulosis   . Elevated transaminase level 02/07/2013  .  Enteritis   . GERD (gastroesophageal reflux disease) 09/01/2010  . Giant cell arteritis ([place])   . Hemorrhoids   . Herniated nucleus pulposus, L5-S1, right 11/04/2015  . History of scabies   . HTN (hypertension) 05/20/2017  . Hyperlipidemia    takes Lipitor daily  . Hypertension    takes Amlodipine,Losartan,Metoprolol,and HCTZ daily  . Incontinence of urine   . Influenza A 05/20/2017  . Insomnia    takes Restoril nightly  . Lumbar stenosis 04/25/2013  . Major depressive disorder, recurrent episode, moderate ([place])  07/16/2013  . Migraines    "back in my 20's; none since" (10/28/2012)  . Osteoporosis   . PAF (paroxysmal atrial fibrillation) ([place]) 2011   a. lone epidode in 2011 according to notes.  . Rheumatoid arthritis ([place])   . Scoliosis   . Sepsis ([place]) 05/20/2017  . Sinus bradycardia    a. on chronic bb  . Temporal arteritis ([place]) 2011   a. followed @ [facility]; potential flareup 10/28/2012/notes 10/28/2012  . Vocal cord dysfunction    "they don't operate properly" (10/28/2012)    Past Surgical History:  Procedure Laterality Date  . ABDOMINAL HYSTERECTOMY  ~ 1984   vaginally  . BACK SURGERY  7-47yrs ago   X Stop  . BLADDER SUSPENSION  2001  . BREAST BIOPSY Right   . CATARACT EXTRACTION W/ INTRAOCULAR LENS IMPLANT Right ~ 08/2012  . CHEST TUBE INSERTION Left 03/08/2019   Procedure: Chest Tube Insertion;  Surgeon: Ivin Poot, MD;  Location: [place];  Service: Thoracic;  Laterality: Left;  . COLONOSCOPY  01/26/2012   Procedure: COLONOSCOPY;  Surgeon: Ladene Artist, MD,FACG;  Location: [facility] ENDOSCOPY;  Service: Endoscopy;  Laterality: N/A;  note the EGD is possible  . CORONARY ANGIOPLASTY WITH STENT PLACEMENT  2006   X 1 stent  . EPIDURAL BLOCK INJECTION    . ESOPHAGOGASTRODUODENOSCOPY  01/26/2012   Procedure: ESOPHAGOGASTRODUODENOSCOPY (EGD);  Surgeon: Ladene Artist, MD,FACG;  Location: [facility] ENDOSCOPY;  Service: Endoscopy;  Laterality: N/A;  . HEMIARTHROPLASTY HIP Right 2012  . IR EPIDUROGRAPHY  04/20/2019  . LAPAROSCOPIC CHOLECYSTECTOMY  2001  . LUMBAR FUSION  03/2013  . LUMBAR LAMINECTOMY/DECOMPRESSION MICRODISCECTOMY Right 11/04/2015   Procedure: Right Lumbar Five-Sacral One Microdiskectomy;  Surgeon: Kristeen Miss, MD;  Location: [place] NEURO ORS;  Service: Neurosurgery;  Laterality: Right;  Right L5-S1 Microdiskectomy  . moles removed that required stiches     one on leg and one on face  . SUBXYPHOID PERICARDIAL WINDOW N/A 03/08/2019   Procedure: SUBXYPHOID PERICARDIAL WINDOW;  Surgeon: Ivin Poot, MD;  Location: [place];  Service: Thoracic;  Laterality: N/A;  . TEE WITHOUT CARDIOVERSION N/A 03/08/2019   Procedure: TRANSESOPHAGEAL ECHOCARDIOGRAM (TEE);  Surgeon: Prescott Gum, Collier Salina, MD;  Location: [place];  Service: Thoracic;  Laterality: N/A;  . TEMPORAL ARTERY BIOPSY / LIGATION Bilateral 2011  . TONSILLECTOMY AND ADENOIDECTOMY     at age 58  . TUBAL LIGATION  ~ 1982  . X-STOP IMPLANTATION  ~ 2010   "lower back" (10/28/2012)    Current Outpatient Medications  Medication Sig Dispense Refill  . alendronate (FOSAMAX) 70 MG tablet Take 1 tablet (70 mg total) by mouth every 7 (seven) days. Take with a full glass of water on an empty stomach. 4 tablet 11  . cholecalciferol (VITAMIN D) 1000 UNITS tablet Take 1,000 Units by mouth daily.     Marland Kitchen dicyclomine (BENTYL) 20 MG tablet Take 1 tablet (20 mg total) by mouth 2 (two) times  daily as needed for spasms. 20 tablet 0  . ELIQUIS 5 MG TABS tablet TAKE 1 TABLET BY MOUTH TWICE A DAY 60 tablet 9  . escitalopram (LEXAPRO) 10 MG tablet TAKE 1 TABLET BY MOUTH EVERY DAY (Patient taking differently: Take 10 mg by mouth daily. ) 90 tablet 3  . ferrous sulfate 325 (65 FE) MG tablet Take 1 tablet (325 mg total) by mouth 2 (two) times daily with a meal. 60 tablet 3  . HYDROcodone-acetaminophen (NORCO/VICODIN) 5-325 MG tablet Take one tablet by mouth every 8 hours as needed for pain. May refill in two months. (Patient taking differently: Take 1 tablet by mouth every 6 (six) hours as needed for moderate pain. ) 90 tablet 0  . lidocaine (XYLOCAINE) 5 % ointment Apply 1 application topically 3 (three) times daily as needed. 35.44 g 0  . losartan (COZAAR) 100 MG tablet Take 100 mg by mouth daily.    . metoprolol tartrate (LOPRESSOR) 100 MG tablet Take 1 tablet (100 mg total) by mouth 2 (two) times daily. Please schedule an appt for further refills 1st attempt 180 tablet 0  . ondansetron (ZOFRAN ODT) 4 MG disintegrating tablet Take 1 tablet (4 mg total) by mouth every  8 (eight) hours as needed for nausea or vomiting. 20 tablet 0  . pantoprazole (PROTONIX) 40 MG tablet TAKE 1 TABLET BY MOUTH EVERY DAY (Patient taking differently: Take 40 mg by mouth daily. ) 90 tablet 1  . potassium chloride SA (KLOR-CON) 10 MEQ tablet Take 1 tablet (10 mEq total) by mouth daily. 30 tablet 2  . predniSONE (DELTASONE) 10 MG tablet Take 1 tablet (10 mg total) by mouth daily.    . vitamin C (ASCORBIC ACID) 500 MG tablet Take 1,000 mg by mouth daily.     . vitamin E (VITAMIN E) 400 UNIT capsule Take 400 Units by mouth daily.    Marland Kitchen diltiazem (CARDIZEM CD) 180 MG 24 hr capsule Take 1 capsule (180 mg total) by mouth daily. 90 capsule 3  . furosemide (LASIX) 20 MG tablet Take 2 tablets (40 mg total) by mouth daily. 60 tablet 0   No current facility-administered medications for this visit.    Allergies  Allergen Reactions  . Dextromethorphan Rash    Social History   Socioeconomic History  . Marital status: Married    Spouse name: Not on file  . Number of children: 3  . Years of education: Not on file  . Highest education level: Not on file  Occupational History  . Occupation: Retired Cabin crew  Tobacco Use  . Smoking status: Never Smoker  . Smokeless tobacco: Never Used  Substance and Sexual Activity  . Alcohol use: Yes    Comment: socially  . Drug use: No  . Sexual activity: Not on file  Other Topics Concern  . Not on file  Social History Narrative   Lives in [place] with her husband.  Retired Cabin crew.   Social Determinants of Health   Financial Resource Strain:   . Difficulty of Paying Living Expenses: Not on file  Food Insecurity:   . Worried About Charity fundraiser in the Last Year: Not on file  . Ran Out of Food in the Last Year: Not on file  Transportation Needs:   . Lack of Transportation (Medical): Not on file  . Lack of Transportation (Non-Medical): Not on file  Physical Activity:   . Days of Exercise per Week: Not on file  . Minutes of  Exercise per  Session: Not on file  Stress:   . Feeling of Stress : Not on file  Social Connections:   . Frequency of Communication with Friends and Family: Not on file  . Frequency of Social Gatherings with Friends and Family: Not on file  . Attends Religious Services: Not on file  . Active Member of Clubs or Organizations: Not on file  . Attends Archivist Meetings: Not on file  . Marital Status: Not on file  Intimate Partner Violence:   . Fear of Current or Ex-Partner: Not on file  . Emotionally Abused: Not on file  . Physically Abused: Not on file  . Sexually Abused: Not on file    Family History  Problem Relation Age of Onset  . Brain cancer Mother 108  . Heart attack Father   . Breast cancer Daughter 75  . Heart disease Other   . Hypertension Other   . Arthritis Neg Hx   . Colon cancer Neg Hx   . Osteoporosis Neg Hx     Review of Systems:  As stated in the HPI and otherwise negative.   BP 108/60   Pulse (!) 56   Ht 5\' 4"  (1.626 m)   Wt 152 lb 3.2 oz (69 kg)   SpO2 98%   BMI 26.13 kg/m   Physical Examination:  General: Well developed, well nourished, NAD  HEENT: OP clear, mucus membranes moist  SKIN: warm, dry. No rashes. Neuro: No focal deficits  Musculoskeletal: Muscle strength 5/5 all ext  Psychiatric: Mood and affect normal  Neck: No JVD, no carotid bruits, no thyromegaly, no lymphadenopathy.  Lungs:Clear bilaterally, no wheezes, rhonci, crackles Cardiovascular: Regular rate and rhythm. No murmurs, gallops or rubs. Abdomen:Soft. Bowel sounds present. Non-tender.  Extremities: No lower extremity edema. Pulses are 2 + in the bilateral DP/PT.  Cardiac cath 12/09/12: Ao: 150/61  LV: 155/13/18  RA: 6  RV: 25/5/8  PA: 24/10 (mean 16)  PCWP: 9  Fick Cardiac Output: 3.9 L/min  Fick Cardiac Index: 2.3 L/min/m2  Central Aortic Saturation: 98%  Pulmonary Artery Saturation: 69%  Angiographic Findings:  Left main: 10% distal stenosis.  Left  Anterior Descending Artery: Large caliber vessel that courses to the apex. The proximal and mid vessel has moderate calcification. The mid vessel has a long 20% stenosis. There are two small caliber diagonal branches with mild plaque disease.  Circumflex Artery: Large caliber vessel with two small caliber obtuse marginal branches. No obstructive disease.  Right Coronary Artery: Large caliber, dominant vessel with a patent stent in the mid vessel. There is minimal stent restenosis. Just beyond the stent there is a 20% stenosis.  Left Ventricular Angiogram: LVEF=65%  Impression:  1. Stable single vessel CAD with patent mid RCA stent  2. Normal LV systolic function  3. Normal PA pressures  Echo January 2021:  1. Left ventricular ejection fraction, by visual estimation, is 60 to  65%. The left ventricle has normal function. There is mildly increased  left ventricular hypertrophy.  2. Left ventricular diastolic parameters are consistent with Grade II  diastolic dysfunction (pseudonormalization). The mean left atrial pressure  is high.  3. The left ventricle has no regional wall motion abnormalities.  4. Global right ventricle has normal systolic function.The right  ventricular size is normal. No increase in right ventricular wall  thickness.  5. Left atrial size was mildly dilated.  6. Right atrial size was mildly dilated.  7. Mild mitral annular calcification.  8. The mitral valve is  normal in structure. Trivial mitral valve  regurgitation.  9. The tricuspid valve is normal in structure.  10. The tricuspid valve is normal in structure. Tricuspid valve  regurgitation is mild-moderate.  11. The aortic valve is normal in structure. Aortic valve regurgitation is  trivial.  12. The pulmonic valve was not well visualized. Pulmonic valve  regurgitation is not visualized.  13. Mildly elevated pulmonary artery systolic pressure.  14. The tricuspid regurgitant velocity is 2.18 m/s, and  with an assumed  right atrial pressure of 15 mmHg, the estimated right ventricular systolic  pressure is mildly elevated at 34.0 mmHg.  15. The inferior vena cava is dilated in size with <50% respiratory  variability, suggesting right atrial pressure of 15 mmHg.  16. There is exaggerated respiratory displacement of the interventricular  septum. This could represent contrictive physiology , but can also be seen  with increased work of breathing (e.g. COPD or asthma exacerbation).  16. Compared to 03/09/2019, left ventricular wall motion and overall  systolic function have improved.   EKG:  EKG is ordered today. The ekg ordered today demonstrates  Recent Labs: 03/06/2019: TSH 3.085 04/16/2019: ALT 26 04/18/2019: B Natriuretic Peptide 930.1; Magnesium 2.3 04/19/2019: BUN 30; Creatinine, Ser 1.27; Potassium 4.0; Sodium 138 04/21/2019: Hemoglobin 9.7; Platelets 272   Lipid Panel    Component Value Date/Time   CHOL 171 11/14/2018 1420   CHOL 155 08/18/2017 1241   TRIG 168.0 (H) 11/14/2018 1420   HDL 63.40 11/14/2018 1420   HDL 78 08/18/2017 1241   CHOLHDL 3 11/14/2018 1420   VLDL 33.6 11/14/2018 1420   LDLCALC 74 11/14/2018 1420   LDLCALC 51 08/18/2017 1241     Wt Readings from Last 3 Encounters:  05/25/19 152 lb 3.2 oz (69 kg)  05/12/19 150 lb 9.6 oz (68.3 kg)  04/28/19 152 lb 9.6 oz (69.2 kg)     Other studies Reviewed: Additional studies/ records that were reviewed today include: . Review of the above records demonstrates:    Assessment and Plan:   1. CAD with stable angina: She has a Taxus drug eluting stent in the RCA, placed in 2005 in [place]. Cath at [facility] September 2014 with patent RCA stent and mild disease LAD and circumflex. Stress test in June 2019 without ischemia. No chest pain. Continue beta blocker. No ASA since she is on Eliquis. She has stopped her statin due to leg pains.   2. HTN: BP is controlled.   3. Hyperlipidemia: LDL near goal August 2020. Now off of  statin due to leg pain and abdominal pain.   4. PAF: Sinus today. Continue Cardizem, Lopressor and Eliquis.    Will change Cardizem to Cardizem CD 180 mg daily.    5. Acute on chronic diastolic CHF: Weight is stable. Continue Lasix.    Current medicines are reviewed at length with the patient today.  The patient does not have concerns regarding medicines.  The following changes have been made:  no change  Labs/ tests ordered today include:   No orders of the defined types were placed in this encounter.   Disposition:   FU with me in 6 months  Signed, [name], MD 05/25/2019 12:35 PM    [address], [place], [place]  [address] Phone: [phone]; Fax: [phone]

## 2019-05-25 NOTE — ED Notes (Signed)
Discharge instructions discussed with Pt. Pt verbalized understanding. Pt stable and discharged via wheelchair to daughter.

## 2019-05-28 ENCOUNTER — Inpatient Hospital Stay (HOSPITAL_COMMUNITY)
Admission: EM | Admit: 2019-05-28 | Discharge: 2019-06-01 | DRG: 872 | Disposition: A | Discharge status: Home Health | Payer: Medicare Other | Attending: Internal Medicine | Admitting: Internal Medicine

## 2019-05-28 ENCOUNTER — Emergency Department (HOSPITAL_COMMUNITY): Payer: Medicare Other

## 2019-05-28 ENCOUNTER — Encounter (HOSPITAL_COMMUNITY): Payer: Self-pay | Admitting: Emergency Medicine

## 2019-05-28 ENCOUNTER — Other Ambulatory Visit: Payer: Self-pay

## 2019-05-28 DIAGNOSIS — N1831 Chronic kidney disease, stage 3a: Secondary | ICD-10-CM | POA: Diagnosis not present

## 2019-05-28 DIAGNOSIS — R509 Fever, unspecified: Secondary | ICD-10-CM | POA: Diagnosis not present

## 2019-05-28 DIAGNOSIS — Z8249 Family history of ischemic heart disease and other diseases of the circulatory system: Secondary | ICD-10-CM

## 2019-05-28 DIAGNOSIS — J383 Other diseases of vocal cords: Secondary | ICD-10-CM | POA: Diagnosis present

## 2019-05-28 DIAGNOSIS — M069 Rheumatoid arthritis, unspecified: Secondary | ICD-10-CM | POA: Diagnosis not present

## 2019-05-28 DIAGNOSIS — Z9841 Cataract extraction status, right eye: Secondary | ICD-10-CM

## 2019-05-28 DIAGNOSIS — A414 Sepsis due to anaerobes: Principal | ICD-10-CM | POA: Diagnosis present

## 2019-05-28 DIAGNOSIS — I493 Ventricular premature depolarization: Secondary | ICD-10-CM | POA: Diagnosis not present

## 2019-05-28 DIAGNOSIS — M545 Low back pain: Secondary | ICD-10-CM | POA: Diagnosis present

## 2019-05-28 DIAGNOSIS — R000 Tachycardia, unspecified: Secondary | ICD-10-CM | POA: Diagnosis not present

## 2019-05-28 DIAGNOSIS — I509 Heart failure, unspecified: Secondary | ICD-10-CM

## 2019-05-28 DIAGNOSIS — E785 Hyperlipidemia, unspecified: Secondary | ICD-10-CM | POA: Diagnosis not present

## 2019-05-28 DIAGNOSIS — M353 Polymyalgia rheumatica: Secondary | ICD-10-CM | POA: Diagnosis present

## 2019-05-28 DIAGNOSIS — Z888 Allergy status to other drugs, medicaments and biological substances status: Secondary | ICD-10-CM | POA: Diagnosis not present

## 2019-05-28 DIAGNOSIS — I4819 Other persistent atrial fibrillation: Secondary | ICD-10-CM | POA: Diagnosis not present

## 2019-05-28 DIAGNOSIS — K219 Gastro-esophageal reflux disease without esophagitis: Secondary | ICD-10-CM | POA: Diagnosis present

## 2019-05-28 DIAGNOSIS — M810 Age-related osteoporosis without current pathological fracture: Secondary | ICD-10-CM | POA: Diagnosis not present

## 2019-05-28 DIAGNOSIS — F411 Generalized anxiety disorder: Secondary | ICD-10-CM | POA: Diagnosis present

## 2019-05-28 DIAGNOSIS — I480 Paroxysmal atrial fibrillation: Secondary | ICD-10-CM | POA: Diagnosis not present

## 2019-05-28 DIAGNOSIS — M316 Other giant cell arteritis: Secondary | ICD-10-CM | POA: Diagnosis present

## 2019-05-28 DIAGNOSIS — Z955 Presence of coronary angioplasty implant and graft: Secondary | ICD-10-CM | POA: Diagnosis not present

## 2019-05-28 DIAGNOSIS — Z66 Do not resuscitate: Secondary | ICD-10-CM | POA: Diagnosis present

## 2019-05-28 DIAGNOSIS — I2510 Atherosclerotic heart disease of native coronary artery without angina pectoris: Secondary | ICD-10-CM | POA: Diagnosis not present

## 2019-05-28 DIAGNOSIS — D631 Anemia in chronic kidney disease: Secondary | ICD-10-CM | POA: Diagnosis not present

## 2019-05-28 DIAGNOSIS — Z7952 Long term (current) use of systemic steroids: Secondary | ICD-10-CM | POA: Diagnosis not present

## 2019-05-28 DIAGNOSIS — I4891 Unspecified atrial fibrillation: Secondary | ICD-10-CM | POA: Diagnosis not present

## 2019-05-28 DIAGNOSIS — Z79891 Long term (current) use of opiate analgesic: Secondary | ICD-10-CM

## 2019-05-28 DIAGNOSIS — Z8673 Personal history of transient ischemic attack (TIA), and cerebral infarction without residual deficits: Secondary | ICD-10-CM

## 2019-05-28 DIAGNOSIS — F329 Major depressive disorder, single episode, unspecified: Secondary | ICD-10-CM | POA: Diagnosis present

## 2019-05-28 DIAGNOSIS — G8929 Other chronic pain: Secondary | ICD-10-CM | POA: Diagnosis not present

## 2019-05-28 DIAGNOSIS — K5730 Diverticulosis of large intestine without perforation or abscess without bleeding: Secondary | ICD-10-CM | POA: Diagnosis present

## 2019-05-28 DIAGNOSIS — Z803 Family history of malignant neoplasm of breast: Secondary | ICD-10-CM

## 2019-05-28 DIAGNOSIS — Z981 Arthrodesis status: Secondary | ICD-10-CM

## 2019-05-28 DIAGNOSIS — N183 Chronic kidney disease, stage 3 unspecified: Secondary | ICD-10-CM

## 2019-05-28 DIAGNOSIS — I4510 Unspecified right bundle-branch block: Secondary | ICD-10-CM | POA: Diagnosis present

## 2019-05-28 DIAGNOSIS — Z7901 Long term (current) use of anticoagulants: Secondary | ICD-10-CM

## 2019-05-28 DIAGNOSIS — I130 Hypertensive heart and chronic kidney disease with heart failure and stage 1 through stage 4 chronic kidney disease, or unspecified chronic kidney disease: Secondary | ICD-10-CM | POA: Diagnosis present

## 2019-05-28 DIAGNOSIS — R5383 Other fatigue: Secondary | ICD-10-CM | POA: Diagnosis not present

## 2019-05-28 DIAGNOSIS — R1110 Vomiting, unspecified: Secondary | ICD-10-CM | POA: Diagnosis not present

## 2019-05-28 DIAGNOSIS — Z808 Family history of malignant neoplasm of other organs or systems: Secondary | ICD-10-CM

## 2019-05-28 DIAGNOSIS — I5032 Chronic diastolic (congestive) heart failure: Secondary | ICD-10-CM | POA: Diagnosis not present

## 2019-05-28 DIAGNOSIS — R197 Diarrhea, unspecified: Secondary | ICD-10-CM | POA: Diagnosis not present

## 2019-05-28 DIAGNOSIS — Z20822 Contact with and (suspected) exposure to covid-19: Secondary | ICD-10-CM | POA: Diagnosis present

## 2019-05-28 DIAGNOSIS — Z9049 Acquired absence of other specified parts of digestive tract: Secondary | ICD-10-CM

## 2019-05-28 DIAGNOSIS — R05 Cough: Secondary | ICD-10-CM | POA: Diagnosis present

## 2019-05-28 DIAGNOSIS — A0472 Enterocolitis due to Clostridium difficile, not specified as recurrent: Secondary | ICD-10-CM | POA: Diagnosis not present

## 2019-05-28 DIAGNOSIS — G4700 Insomnia, unspecified: Secondary | ICD-10-CM | POA: Diagnosis present

## 2019-05-28 DIAGNOSIS — Z79899 Other long term (current) drug therapy: Secondary | ICD-10-CM

## 2019-05-28 DIAGNOSIS — Z7983 Long term (current) use of bisphosphonates: Secondary | ICD-10-CM

## 2019-05-28 DIAGNOSIS — A419 Sepsis, unspecified organism: Secondary | ICD-10-CM | POA: Diagnosis present

## 2019-05-28 DIAGNOSIS — Z90710 Acquired absence of both cervix and uterus: Secondary | ICD-10-CM

## 2019-05-28 DIAGNOSIS — Z8679 Personal history of other diseases of the circulatory system: Secondary | ICD-10-CM

## 2019-05-28 DIAGNOSIS — Z961 Presence of intraocular lens: Secondary | ICD-10-CM | POA: Diagnosis present

## 2019-05-28 LAB — CBC WITH DIFFERENTIAL/PLATELET
Abs Immature Granulocytes: 0.1 10*3/uL — ABNORMAL HIGH (ref 0.00–0.07)
Basophils Absolute: 0.1 10*3/uL (ref 0.0–0.1)
Basophils Relative: 0 %
Eosinophils Absolute: 0 10*3/uL (ref 0.0–0.5)
Eosinophils Relative: 0 %
HCT: 39.6 % (ref 36.0–46.0)
Hemoglobin: 12.6 g/dL (ref 12.0–15.0)
Immature Granulocytes: 1 %
Lymphocytes Relative: 5 %
Lymphs Abs: 1.1 10*3/uL (ref 0.7–4.0)
MCH: 29.4 pg (ref 26.0–34.0)
MCHC: 31.8 g/dL (ref 30.0–36.0)
MCV: 92.5 fL (ref 80.0–100.0)
Monocytes Absolute: 1.8 10*3/uL — ABNORMAL HIGH (ref 0.1–1.0)
Monocytes Relative: 9 %
Neutro Abs: 17.2 10*3/uL — ABNORMAL HIGH (ref 1.7–7.7)
Neutrophils Relative %: 85 %
Platelets: 231 10*3/uL (ref 150–400)
RBC: 4.28 MIL/uL (ref 3.87–5.11)
RDW: 15.8 % — ABNORMAL HIGH (ref 11.5–15.5)
WBC: 20.3 10*3/uL — ABNORMAL HIGH (ref 4.0–10.5)
nRBC: 0 % (ref 0.0–0.2)

## 2019-05-28 LAB — COMPREHENSIVE METABOLIC PANEL
ALT: 12 U/L (ref 0–44)
AST: 21 U/L (ref 15–41)
Albumin: 3.6 g/dL (ref 3.5–5.0)
Alkaline Phosphatase: 76 U/L (ref 38–126)
Anion gap: 12 (ref 5–15)
BUN: 14 mg/dL (ref 8–23)
CO2: 24 mmol/L (ref 22–32)
Calcium: 9 mg/dL (ref 8.9–10.3)
Chloride: 100 mmol/L (ref 98–111)
Creatinine, Ser: 0.94 mg/dL (ref 0.44–1.00)
GFR calc Af Amer: >60 mL/min (ref >60)
GFR calc non Af Amer: 56 mL/min — ABNORMAL LOW (ref >60)
Glucose, Bld: 153 mg/dL — ABNORMAL HIGH (ref 70–99)
Potassium: 4.3 mmol/L (ref 3.5–5.1)
Sodium: 136 mmol/L (ref 135–145)
Total Bilirubin: 1.1 mg/dL (ref 0.3–1.2)
Total Protein: 6.9 g/dL (ref 6.5–8.1)

## 2019-05-28 LAB — PROTIME-INR
INR: 1.4 — ABNORMAL HIGH (ref 0.8–1.2)
Prothrombin Time: 17.2 seconds — ABNORMAL HIGH (ref 11.4–15.2)

## 2019-05-28 LAB — LACTIC ACID, PLASMA: Lactic Acid, Venous: 2.4 mmol/L (ref 0.5–1.9)

## 2019-05-28 LAB — BRAIN NATRIURETIC PEPTIDE: B Natriuretic Peptide: 260.3 pg/mL — ABNORMAL HIGH (ref 0.0–100.0)

## 2019-05-28 LAB — APTT: aPTT: 32 seconds (ref 24–36)

## 2019-05-28 LAB — POC SARS CORONAVIRUS 2 AG -  ED: SARS Coronavirus 2 Ag: NEGATIVE

## 2019-05-28 MED ORDER — VANCOMYCIN HCL IN DEXTROSE 1-5 GM/200ML-% IV SOLN: 1000.0000 mg | Freq: Once | INTRAVENOUS | Status: DC

## 2019-05-28 MED ORDER — ONDANSETRON HCL 4 MG/2ML IJ SOLN
4.0000 mg | Freq: Once | INTRAMUSCULAR | Status: AC
  Administered 2019-05-28: 4 mg via INTRAVENOUS
  Filled 2019-05-28: qty 2

## 2019-05-28 MED ORDER — VANCOMYCIN HCL IN DEXTROSE 1-5 GM/200ML-% IV SOLN: 1000.0000 mg | INTRAVENOUS | Status: DC

## 2019-05-28 MED ORDER — IOHEXOL 300 MG/ML  SOLN
100.0000 mL | Freq: Once | INTRAMUSCULAR | Status: AC | PRN
  Administered 2019-05-28: 100 mL via INTRAVENOUS

## 2019-05-28 MED ORDER — METRONIDAZOLE IN NACL 5-0.79 MG/ML-% IV SOLN
500.0000 mg | Freq: Once | INTRAVENOUS | Status: AC
  Administered 2019-05-28: 500 mg via INTRAVENOUS
  Filled 2019-05-28: qty 100

## 2019-05-28 MED ORDER — LACTATED RINGERS IV BOLUS (SEPSIS)
1000.0000 mL | Freq: Once | INTRAVENOUS | Status: AC
  Administered 2019-05-28: 1000 mL via INTRAVENOUS

## 2019-05-28 MED ORDER — VANCOMYCIN HCL 1500 MG/300ML IV SOLN
1500.0000 mg | Freq: Once | INTRAVENOUS | Status: AC
  Administered 2019-05-29: 1500 mg via INTRAVENOUS
  Filled 2019-05-28: qty 300

## 2019-05-28 MED ORDER — SODIUM CHLORIDE 0.9 % IV SOLN
2.0000 g | Freq: Once | INTRAVENOUS | Status: AC
  Administered 2019-05-28: 2 g via INTRAVENOUS
  Filled 2019-05-28: qty 2

## 2019-05-28 MED ORDER — SODIUM CHLORIDE 0.9 % IV SOLN
2.0000 g | Freq: Two times a day (BID) | INTRAVENOUS | Status: DC
  Administered 2019-05-29: 2 g via INTRAVENOUS
  Filled 2019-05-28: qty 2

## 2019-05-28 MED ORDER — FENTANYL CITRATE (PF) 100 MCG/2ML IJ SOLN
25.0000 ug | Freq: Once | INTRAMUSCULAR | Status: AC
  Administered 2019-05-28: 25 ug via INTRAVENOUS
  Filled 2019-05-28: qty 2

## 2019-05-28 MED ORDER — ACETAMINOPHEN 325 MG PO TABS
650.0000 mg | ORAL_TABLET | Freq: Once | ORAL | Status: AC
  Administered 2019-05-28: 650 mg via ORAL
  Filled 2019-05-28: qty 2

## 2019-05-28 NOTE — ED Provider Notes (Signed)
[facility] EMERGENCY DEPARTMENT Provider Note   CSN: [id number] Arrival date & time: 05/28/19  1941     History Chief Complaint  Patient presents with  . Fever/ Fatigue/ Lightheaded    Betty Jackson is a 85 y.o. female with past medical history of CAD, CKD, CVA, RA, Diverticulitis, pericardial effusion/tamponade status post window, who presents today for evaluation of feeling unwell. She reports that prior to arrival she started feeling unwell.  She reports temps of 102 at home.  She reports that she had a normal bowel movement today with a small amount of diarrhea after. She denies any chest pain or shortness of breath.  She denies any dysuria, does note increased frequency.  She had her second covid shot over 2 weeks ago.  She reports nausea and dry heaving here today.    She denies any trauma.  She was seen here three days ago after she had a syncopal event at home.    She reports no wounds.  She denies any new pain in her head, chest, abdomen.  She has chronic back pain that is unchanged from baseline.      HPI     Past Medical History:  Diagnosis Date  . Abdominal pain, epigastric 06/22/2013  . Abdominal pain, other specified site 12/24/2011  . Abnormal urinalysis 05/20/2017  . Acute respiratory failure with hypoxia ([place]) 05/20/2017  . Anemia, unspecified 10/28/2012  . Anxiety state, unspecified 10/28/2012  . Bruises easily    d/t being on prednisone and plavix  . CAD (coronary artery disease)    a. Stent RCA in [facility];  b. Cath approx 2009 - nonobs per pt report.  . Cataract    immature on the left eye  . Chronic insomnia 02/07/2013  . Chronic lower back pain    scoliosis  . CKD (chronic kidney disease) stage 3, GFR 30-59 ml/min   . Complication of anesthesia    pt has a very high tolerance to meds  . Coronary disease 05/20/2017  . CVA (cerebral infarction) 10/29/2012  . DDD (degenerative disc disease)   . Depression   . Diverticulitis      hx of  . Diverticulosis   . Elevated transaminase level 02/07/2013  . Enteritis   . GERD (gastroesophageal reflux disease) 09/01/2010  . Giant cell arteritis ([place])   . Hemorrhoids   . Herniated nucleus pulposus, L5-S1, right 11/04/2015  . History of scabies   . HTN (hypertension) 05/20/2017  . Hyperlipidemia    takes Lipitor daily  . Hypertension    takes Amlodipine,Losartan,Metoprolol,and HCTZ daily  . Incontinence of urine   . Influenza A 05/20/2017  . Insomnia    takes Restoril nightly  . Lumbar stenosis 04/25/2013  . Major depressive disorder, recurrent episode, moderate ([place]) 07/16/2013  . Migraines    "back in my 20's; none since" (10/28/2012)  . Osteoporosis   . PAF (paroxysmal atrial fibrillation) ([place]) 2011   a. lone epidode in 2011 according to notes.  . Rheumatoid arthritis ([place])   . Scoliosis   . Sepsis ([place]) 05/20/2017  . Sinus bradycardia    a. on chronic bb  . Temporal arteritis ([place]) 2011   a. followed @ [facility]; potential flareup 10/28/2012/notes 10/28/2012  . Vocal cord dysfunction    "they don't operate properly" (10/28/2012)    Patient Active Problem List   Diagnosis Date Noted  . Recurrent falls 04/30/2019  . Pericardial effusion without cardiac tamponade 03/29/2019  . Hypoalbuminemia 03/27/2019  .  Cardiac tamponade   . Pericarditis 03/06/2019  . Fever   . Septic shock ([place])   . Atypical chest pain   . Atrial fibrillation with rapid ventricular response ([place])   . Nausea 01/17/2019  . Acute diverticulitis 01/13/2019  . AKI (acute kidney injury) ([place]) 01/13/2019  . Chronic pain 01/13/2019  . Tachycardia 01/13/2019  . Hypokalemia 01/13/2019  . Chronic low back pain 07/10/2018  . Influenza A 05/20/2017  . Sepsis ([place]) 05/20/2017  . Acute respiratory failure with hypoxia ([place]) 05/20/2017  . CKD (chronic kidney disease) stage 3, GFR 30-59 ml/min 05/20/2017  . PAF (paroxysmal atrial fibrillation) ([place]) 05/20/2017  . HTN (hypertension) 05/20/2017  . Giant cell  arteritis ([place]) 05/20/2017  . Coronary disease 05/20/2017  . Abnormal urinalysis 05/20/2017  . Osteoporosis 05/08/2016  . Herniated nucleus pulposus, L5-S1, right 11/04/2015  . Major depressive disorder, recurrent episode, moderate ([place]) 07/16/2013  . Abdominal pain, epigastric 06/22/2013  . Lumbar stenosis 04/25/2013  . Chronic insomnia 02/07/2013  . Elevated transaminase level 02/07/2013  . CVA (cerebral infarction) 10/29/2012  . Visual changes 10/28/2012  . Depression 10/28/2012  . Anxiety state, unspecified 10/28/2012  . Anemia, unspecified 10/28/2012  . Nonspecific (abnormal) findings on radiological and other examination of gastrointestinal tract 01/25/2012  . Hyponatremia 01/24/2012  . Hematuria 01/24/2012  . Enteritis 12/24/2011  . Abdominal pain, other specified site 12/24/2011  . Leg pain, right 02/18/2011  . Temporal arteritis ([place]) 09/01/2010  . Hypertension 09/01/2010  . Hyperlipidemia 09/01/2010  . History of atrial fibrillation 09/01/2010  . CAD (coronary artery disease) 09/01/2010  . GERD (gastroesophageal reflux disease) 09/01/2010  . Insomnia 09/01/2010    Past Surgical History:  Procedure Laterality Date  . ABDOMINAL HYSTERECTOMY  ~ 1984   vaginally  . BACK SURGERY  7-65yrs ago   X Stop  . BLADDER SUSPENSION  2001  . BREAST BIOPSY Right   . CATARACT EXTRACTION W/ INTRAOCULAR LENS IMPLANT Right ~ 08/2012  . CHEST TUBE INSERTION Left 03/08/2019   Procedure: Chest Tube Insertion;  Surgeon: Ivin Poot, MD;  Location: [facility];  Service: Thoracic;  Laterality: Left;  . COLONOSCOPY  01/26/2012   Procedure: COLONOSCOPY;  Surgeon: Ladene Artist, MD,FACG;  Location: [facility] ENDOSCOPY;  Service: Endoscopy;  Laterality: N/A;  note the EGD is possible  . CORONARY ANGIOPLASTY WITH STENT PLACEMENT  2006   X 1 stent  . EPIDURAL BLOCK INJECTION    . ESOPHAGOGASTRODUODENOSCOPY  01/26/2012   Procedure: ESOPHAGOGASTRODUODENOSCOPY (EGD);  Surgeon: Ladene Artist, MD,FACG;   Location: [facility] ENDOSCOPY;  Service: Endoscopy;  Laterality: N/A;  . HEMIARTHROPLASTY HIP Right 2012  . IR EPIDUROGRAPHY  04/20/2019  . LAPAROSCOPIC CHOLECYSTECTOMY  2001  . LUMBAR FUSION  03/2013  . LUMBAR LAMINECTOMY/DECOMPRESSION MICRODISCECTOMY Right 11/04/2015   Procedure: Right Lumbar Five-Sacral One Microdiskectomy;  Surgeon: Kristeen Miss, MD;  Location: [facility] NEURO ORS;  Service: Neurosurgery;  Laterality: Right;  Right L5-S1 Microdiskectomy  . moles removed that required stiches     one on leg and one on face  . SUBXYPHOID PERICARDIAL WINDOW N/A 03/08/2019   Procedure: SUBXYPHOID PERICARDIAL WINDOW;  Surgeon: Ivin Poot, MD;  Location: [facility];  Service: Thoracic;  Laterality: N/A;  . TEE WITHOUT CARDIOVERSION N/A 03/08/2019   Procedure: TRANSESOPHAGEAL ECHOCARDIOGRAM (TEE);  Surgeon: Prescott Gum, Collier Salina, MD;  Location: [facility];  Service: Thoracic;  Laterality: N/A;  . TEMPORAL ARTERY BIOPSY / LIGATION Bilateral 2011  . TONSILLECTOMY AND ADENOIDECTOMY     at age 80  .  TUBAL LIGATION  ~ 1982  . X-STOP IMPLANTATION  ~ 2010   "lower back" (10/28/2012)     OB History   No obstetric history on file.     Family History  Problem Relation Age of Onset  . Brain cancer Mother 6  . Heart attack Father   . Breast cancer Daughter 75  . Heart disease Other   . Hypertension Other   . Arthritis Neg Hx   . Colon cancer Neg Hx   . Osteoporosis Neg Hx     Social History   Tobacco Use  . Smoking status: Never Smoker  . Smokeless tobacco: Never Used  Substance Use Topics  . Alcohol use: Yes    Comment: socially  . Drug use: No    Home Medications Prior to Admission medications   Medication Sig Start Date End Date Taking? Authorizing Provider  alendronate (FOSAMAX) 70 MG tablet Take 1 tablet (70 mg total) by mouth every 7 (seven) days. Take with a full glass of water on an empty stomach. Patient taking differently: Take 70 mg by mouth every Tuesday. Take with a full glass of water on an  empty stomach. 04/28/19   Burchette, Alinda Sierras, MD  Cholecalciferol (VITAMIN D-3) 25 MCG (1000 UT) CAPS Take 1,000 Units by mouth daily.    [provider]  dicyclomine (BENTYL) 20 MG tablet Take 1 tablet (20 mg total) by mouth 2 (two) times daily as needed for spasms. Patient not taking: Reported on 05/25/2019 01/12/19   Gareth Morgan, MD  diltiazem (CARDIZEM CD) 180 MG 24 hr capsule Take 1 capsule (180 mg total) by mouth daily. 05/25/19   Burnell Blanks, MD  ELIQUIS 5 MG TABS tablet TAKE 1 TABLET BY MOUTH TWICE A DAY Patient taking differently: Take 5 mg by mouth 2 (two) times daily.  05/15/19   Burnell Blanks, MD  escitalopram (LEXAPRO) 10 MG tablet TAKE 1 TABLET BY MOUTH EVERY DAY Patient taking differently: Take 10 mg by mouth daily.  12/14/18   Burchette, Alinda Sierras, MD  ferrous sulfate 325 (65 FE) MG tablet Take 1 tablet (325 mg total) by mouth 2 (two) times daily with a meal. 04/21/19   Oswald Hillock, MD  furosemide (LASIX) 20 MG tablet Take 2 tablets (40 mg total) by mouth daily. Patient taking differently: Take 40 mg by mouth 2 (two) times daily.  04/21/19 05/25/19  Oswald Hillock, MD  HYDROcodone-acetaminophen (NORCO/VICODIN) 5-325 MG tablet Take one tablet by mouth every 8 hours as needed for pain. May refill in two months. Patient taking differently: Take 1 tablet by mouth every 6 (six) hours as needed (for back pain).  08/05/18   Burchette, Alinda Sierras, MD  lidocaine (XYLOCAINE) 5 % ointment Apply 1 application topically 3 (three) times daily as needed. Patient not taking: Reported on 05/25/2019 04/13/19   Long, Wonda Olds, MD  losartan (COZAAR) 100 MG tablet Take 100 mg by mouth daily.    [provider]  metoprolol tartrate (LOPRESSOR) 100 MG tablet Take 1 tablet (100 mg total) by mouth 2 (two) times daily. Please schedule an appt for further refills 1st attempt Patient taking differently: Take 100 mg by mouth 2 (two) times daily.  04/26/19   Burnell Blanks,  MD  ondansetron (ZOFRAN ODT) 4 MG disintegrating tablet Take 1 tablet (4 mg total) by mouth every 8 (eight) hours as needed for nausea or vomiting. Patient taking differently: Take 4 mg by mouth every 8 (eight) hours as  needed for nausea or vomiting (DISSOLVE IN THE MOUTH).  01/12/19   Gareth Morgan, MD  pantoprazole (PROTONIX) 40 MG tablet TAKE 1 TABLET BY MOUTH EVERY DAY Patient taking differently: Take 40 mg by mouth daily.  02/17/19   Burchette, Alinda Sierras, MD  potassium chloride SA (KLOR-CON) 10 MEQ tablet Take 1 tablet (10 mEq total) by mouth daily. 04/21/19   Oswald Hillock, MD  predniSONE (DELTASONE) 10 MG tablet Take 1 tablet (10 mg total) by mouth daily. 04/21/19   Oswald Hillock, MD  vitamin C (ASCORBIC ACID) 500 MG tablet Take 1,000 mg by mouth daily.     [provider]  vitamin E (VITAMIN E) 400 UNIT capsule Take 400 Units by mouth daily.    [provider]    Allergies    Dextromethorphan  Review of Systems   Review of Systems  Constitutional: Positive for chills, fatigue and fever.  HENT: Negative for congestion.   Eyes: Negative for visual disturbance.  Respiratory: Negative for shortness of breath.   Cardiovascular: Negative for chest pain.  Gastrointestinal: Positive for diarrhea and nausea. Negative for abdominal pain and vomiting.  Genitourinary: Positive for frequency. Negative for dysuria and urgency.  Musculoskeletal: Positive for back pain (Chronic, unchanged).  Skin: Negative for color change and wound.  Neurological: Negative for weakness and headaches.  All other systems reviewed and are negative.   Physical Exam Updated Vital Signs BP (!) 118/97   Pulse 85   Temp (!) 102 F (38.9 C) (Rectal)   Resp (!) 22   Ht 5\' 4"  (1.626 m)   Wt 70 kg   SpO2 96%   BMI 26.49 kg/m   Physical Exam Vitals and nursing note reviewed.  Constitutional:      General: She is not in acute distress.    Appearance: She is well-developed.  HENT:     Head:  Normocephalic and atraumatic.  Eyes:     Conjunctiva/sclera: Conjunctivae normal.  Cardiovascular:     Rate and Rhythm: Normal rate and regular rhythm.     Heart sounds: No murmur.  Pulmonary:     Effort: Pulmonary effort is normal. No respiratory distress.     Breath sounds: Normal breath sounds.  Abdominal:     Palpations: Abdomen is soft.     Tenderness: There is no abdominal tenderness.  Musculoskeletal:     Cervical back: Neck supple.  Skin:    General: Skin is warm and dry.  Neurological:     Mental Status: She is alert.     Comments: Patient is awake and alert.  She is able to answer questions appropriately. Spontaneous movement of bilateral arms and legs.     ED Results / Procedures / Treatments   Labs (all labs ordered are listed, but only abnormal results are displayed) Labs Reviewed  CBC WITH DIFFERENTIAL/PLATELET - Abnormal; Notable for the following components:      Result Value   WBC 20.3 (*)    RDW 15.8 (*)    Neutro Abs 17.2 (*)    Monocytes Absolute 1.8 (*)    Abs Immature Granulocytes 0.10 (*)    All other components within normal limits  COMPREHENSIVE METABOLIC PANEL - Abnormal; Notable for the following components:   Glucose, Bld 153 (*)    GFR calc non Af Amer 56 (*)    All other components within normal limits  LACTIC ACID, PLASMA - Abnormal; Notable for the following components:   Lactic Acid, Venous 2.4 (*)  All other components within normal limits  PROTIME-INR - Abnormal; Notable for the following components:   Prothrombin Time 17.2 (*)    INR 1.4 (*)    All other components within normal limits  BRAIN NATRIURETIC PEPTIDE - Abnormal; Notable for the following components:   B Natriuretic Peptide 260.3 (*)    All other components within normal limits  CULTURE, BLOOD (ROUTINE X 2)  CULTURE, BLOOD (ROUTINE X 2)  URINE CULTURE  SARS CORONAVIRUS 2 (TAT 6-24 HRS)  APTT  URINALYSIS, ROUTINE W REFLEX MICROSCOPIC  LACTIC ACID, PLASMA  LIPASE,  BLOOD  BASIC METABOLIC PANEL  POC SARS CORONAVIRUS 2 AG -  ED  TROPONIN I (HIGH SENSITIVITY)  TROPONIN I (HIGH SENSITIVITY)    EKG EKG Interpretation  Date/Time:  Sunday May 28 2019 21:07:37 EST Ventricular Rate:  81 PR Interval:    QRS Duration: 116 QT Interval:  381 QTC Calculation: 443 R Axis:   21 Text Interpretation: Sinus rhythm Multiple ventricular premature complexes Incomplete right bundle branch block Low voltage, precordial leads Abnormal ekg Confirmed by Carmin Muskrat [phone]) on 05/28/2019 9:31:18 PM   Radiology CT Abdomen Pelvis W Contrast  Result Date: 05/28/2019 CLINICAL DATA:  85 year old female with abdominal pain, nausea vomiting. EXAM: CT ABDOMEN AND PELVIS WITH CONTRAST TECHNIQUE: Multidetector CT imaging of the abdomen and pelvis was performed using the standard protocol following bolus administration of intravenous contrast. CONTRAST:  130mL OMNIPAQUE IOHEXOL 300 MG/ML  SOLN COMPARISON:  CT of the abdomen pelvis dated 04/15/2019. FINDINGS: Lower chest: Borderline cardiomegaly. Multi vessel coronary vascular calcification. Partially visualized pericardial effusion measures 9 mm in thickness similar to prior CT. Diffuse interstitial and interlobular septal prominence of the visualized lung bases may represent edema. Partially visualized trace left pleural effusion. No intra-abdominal free air or free fluid. Hepatobiliary: The liver is grossly unremarkable. Mild intrahepatic biliary ductal dilatation as well as mild dilatation of the common bile duct likely post cholecystectomy. Pancreas: Unremarkable. No pancreatic ductal dilatation or surrounding inflammatory changes. Spleen: Normal in size without focal abnormality. Adrenals/Urinary Tract: The adrenal glands are unremarkable. There is no hydronephrosis on either side. There is symmetric enhancement and excretion of contrast by both kidneys. Subcentimeter bilateral renal hypodense lesions are too small to  characterize. The visualized ureters appear unremarkable. The urinary bladder is suboptimally evaluated due to streak artifact caused by right hip arthroplasty. Stomach/Bowel: There is sigmoid diverticulosis. Mild perisigmoid stranding may be chronic. Mild acute diverticulitis is not excluded. Clinical correlation is recommended. There is no bowel obstruction. The appendix is normal. Vascular/Lymphatic: Moderate aortoiliac atherosclerotic disease. The IVC is unremarkable. No portal venous gas. There is no adenopathy. Reproductive: Hysterectomy. No adnexal masses. Other: Small fat containing umbilical hernia. Musculoskeletal: Osteopenia with extensive degenerative changes of the spine. L2-L4 posterior fusion, old T11 compression fracture and vertebroplasty as well as L1 vertebroplasty. Total right hip arthroplasty. Similar fragmented appearance of the left ischial tuberosity, previously suggested to demonstrate a pathologic fracture. Clinical correlation is recommended. No new fracture. IMPRESSION: 1. Sigmoid diverticulosis. Mild perisigmoid stranding, likely chronic. Mild acute diverticulitis is favored less likely but not excluded. Clinical correlation is recommended. No bowel obstruction. Normal appendix. 2. Additional nonacute findings as above and similar to prior CT including Aortic Atherosclerosis (ICD10-I70.0). Electronically Signed   By: Anner Crete M.D.   On: 05/28/2019 23:47   DG Chest Port 1 View  Result Date: 05/28/2019 CLINICAL DATA:  Fevers and weakness EXAM: PORTABLE CHEST 1 VIEW COMPARISON:  05/25/2019 FINDINGS: Cardiac shadow is again  mildly enlarged but accentuated by the portable technique. The lungs are well aerated bilaterally. Very mild vascular congestion is noted increased from the prior study. No interstitial edema is noted. No bony abnormality is seen. IMPRESSION: Mild increased central vascular congestion new from the prior exam. Electronically Signed   By: Inez Catalina M.D.   On:  05/28/2019 21:21    Procedures .Critical Care Performed by: Lorin Glass, PA-C Authorized by: Lorin Glass, PA-C   Critical care provider statement:    Critical care time (minutes):  45   Critical care was necessary to treat or prevent imminent or life-threatening deterioration of the following conditions:  Sepsis   Critical care was time spent personally by me on the following activities:  Discussions with consultants, evaluation of patient's response to treatment, examination of patient, ordering and performing treatments and interventions, ordering and review of laboratory studies, ordering and review of radiographic studies, pulse oximetry, re-evaluation of patient's condition, obtaining history from patient or surrogate and review of old charts   (including critical care time)  Medications Ordered in ED Medications  vancomycin (VANCOREADY) IVPB 1500 mg/300 mL (has no administration in time range)  vancomycin (VANCOCIN) IVPB 1000 mg/200 mL premix (has no administration in time range)  ceFEPIme (MAXIPIME) 2 g in sodium chloride 0.9 % 100 mL IVPB (has no administration in time range)  ondansetron (ZOFRAN) injection 4 mg (4 mg Intravenous Given by Other 05/28/19 2027)  lactated ringers bolus 1,000 mL (0 mLs Intravenous Stopped 05/28/19 2146)  ceFEPIme (MAXIPIME) 2 g in sodium chloride 0.9 % 100 mL IVPB (0 g Intravenous Stopped 05/28/19 2146)  metroNIDAZOLE (FLAGYL) IVPB 500 mg (0 mg Intravenous Stopped 05/28/19 2330)  acetaminophen (TYLENOL) tablet 650 mg (650 mg Oral Given 05/28/19 2103)  iohexol (OMNIPAQUE) 300 MG/ML solution 100 mL (100 mLs Intravenous Contrast Given 05/28/19 2314)  fentaNYL (SUBLIMAZE) injection 25 mcg (25 mcg Intravenous Given 05/28/19 2335)    ED Course  I have reviewed the triage vital signs and the nursing notes.  Pertinent labs & imaging results that were available during my care of the patient were reviewed by me and considered in my medical  decision making (see chart for details).  Clinical Course as of May 27 2354  Nancy Fetter May 28, 2019  2103 SARS Coronavirus 2 Ag: NEGATIVE [EH]  2108 Lactic Acid, Venous(!!): 2.4 [EH]  2200 Sepsis reevaluation complete.  Patient appears more comfortable   [EH]    Clinical Course User Index [EH] Lorin Glass, PA-C   MDM Rules/Calculators/A&P                     Betty Jackson presents today for evaluation of feeling unwell for the past few hours PTA.  On arrival she is noted to be tachypneic and febrile at 102.  She had previous labs 3 days ago and since then she has developed a significant leukocytosis of 20.3.  CMP is significantly unremarkable.    Given tachypnea, fever, and leukocytosis she meets sirs criteria. Suspect sepsis however no clear infection at this time however due to high suspicion she is started on broad-spectrum antibiotics.  Lactic acid is elevated at 2.4. As she does not have a lactic acid over 4 and she is not hypotensive she was not given full 30/kg fluid bolus.    Covid antigen test is negative, 6-12 hours test sent, however she has had both vaccines and it has been more than two weeks since her second dose.  Blood cultures were obtained. EKG without evidence of acute ischemia.  Patient was dry heaving on arrival, this was treated with Zofran.  Given that she had abdominal tenderness to palpation CT abdomen pelvis was ordered.  CT scan shows possible diverticulitis but appears more of chronic.  Urine is pending at this time.    I spoke with Dr. Marlowe Sax who will admit the patient.   Note: Portions of this report may have been transcribed using voice recognition software. Every effort was made to ensure accuracy; however, inadvertent computerized transcription errors may be present   Final Clinical Impression(s) / ED Diagnoses Final diagnoses:  Fever, unspecified fever cause    Rx / DC Orders ED Discharge Orders    None       Ollen Gross 05/29/19 0034    Carmin Muskrat, MD 05/30/19 [phone]

## 2019-05-28 NOTE — ED Triage Notes (Signed)
Patient reports fever this afternoon at home T= 102.5 with fatigue /lightheaded and generalized weakness . Denies cough or urinary discomfort . Afebrile at triage . Respirations unlabored , alert and oriented .

## 2019-05-28 NOTE — ED Notes (Addendum)
Unsuccessful attempt to draw labs. Tech Bre informed.

## 2019-05-28 NOTE — ED Notes (Signed)
Patient daughter calling asking for a call back would like an update on patient Please call Kristian Covey [phone]

## 2019-05-28 NOTE — Progress Notes (Addendum)
Pharmacy Antibiotic Note  Betty Jackson is a 85 y.o. female admitted on 05/28/2019 with sepsis.  Pharmacy has been consulted for Cefepime and Vancomycin dosing.  Height: 5\' 4"  (162.6 cm) Weight: 154 lb 5.2 oz (70 kg) IBW/kg (Calculated) : 54.7  Temp (24hrs), Avg:100.9 F (38.3 C), Min:99.7 F (37.6 C), Max:102 F (38.9 C)  Recent Labs  Lab 05/25/19 1907 05/28/19 2009  WBC 12.1* 20.3*  CREATININE 1.18* 0.94    Estimated Creatinine Clearance: 44.3 mL/min (by C-G formula based on SCr of 0.94 mg/dL).    Allergies  Allergen Reactions  . Dextromethorphan Rash    Antimicrobials this admission: 2/28 Cefepime >>  2/28 Vancomycin >> =  Dose adjustments this admission:   Microbiology results: Pending   Plan:  - Cefepime 2g IV q12h  - Vancomycin 1500mg  IV x 1 dose  - Followed by Vancomycin 1000 mg IV q24h  - Est Calc AUC 529 - Monitor patient's renal function and urine output  - Monitor for opportunity for de-escalation of abx  Thank you for allowing pharmacy to be a part of this patient's care.  Duanne Limerick PharmD. BCPS  05/28/2019 9:02 PM

## 2019-05-28 NOTE — ED Notes (Signed)
Pt POC covid test neg informed RN Andruw

## 2019-05-29 DIAGNOSIS — Z79899 Other long term (current) drug therapy: Secondary | ICD-10-CM | POA: Diagnosis not present

## 2019-05-29 DIAGNOSIS — I2510 Atherosclerotic heart disease of native coronary artery without angina pectoris: Secondary | ICD-10-CM | POA: Diagnosis present

## 2019-05-29 DIAGNOSIS — I4819 Other persistent atrial fibrillation: Secondary | ICD-10-CM | POA: Diagnosis present

## 2019-05-29 DIAGNOSIS — N183 Chronic kidney disease, stage 3 unspecified: Secondary | ICD-10-CM

## 2019-05-29 DIAGNOSIS — I130 Hypertensive heart and chronic kidney disease with heart failure and stage 1 through stage 4 chronic kidney disease, or unspecified chronic kidney disease: Secondary | ICD-10-CM | POA: Diagnosis not present

## 2019-05-29 DIAGNOSIS — F329 Major depressive disorder, single episode, unspecified: Secondary | ICD-10-CM | POA: Diagnosis present

## 2019-05-29 DIAGNOSIS — I5032 Chronic diastolic (congestive) heart failure: Secondary | ICD-10-CM | POA: Diagnosis not present

## 2019-05-29 DIAGNOSIS — Z955 Presence of coronary angioplasty implant and graft: Secondary | ICD-10-CM | POA: Diagnosis not present

## 2019-05-29 DIAGNOSIS — D631 Anemia in chronic kidney disease: Secondary | ICD-10-CM | POA: Diagnosis present

## 2019-05-29 DIAGNOSIS — I480 Paroxysmal atrial fibrillation: Secondary | ICD-10-CM | POA: Diagnosis not present

## 2019-05-29 DIAGNOSIS — A419 Sepsis, unspecified organism: Secondary | ICD-10-CM

## 2019-05-29 DIAGNOSIS — Z8673 Personal history of transient ischemic attack (TIA), and cerebral infarction without residual deficits: Secondary | ICD-10-CM | POA: Diagnosis not present

## 2019-05-29 DIAGNOSIS — I493 Ventricular premature depolarization: Secondary | ICD-10-CM | POA: Diagnosis not present

## 2019-05-29 DIAGNOSIS — A09 Infectious gastroenteritis and colitis, unspecified: Secondary | ICD-10-CM

## 2019-05-29 DIAGNOSIS — I4891 Unspecified atrial fibrillation: Secondary | ICD-10-CM | POA: Diagnosis not present

## 2019-05-29 DIAGNOSIS — A414 Sepsis due to anaerobes: Secondary | ICD-10-CM | POA: Diagnosis not present

## 2019-05-29 DIAGNOSIS — E785 Hyperlipidemia, unspecified: Secondary | ICD-10-CM | POA: Diagnosis present

## 2019-05-29 DIAGNOSIS — Z66 Do not resuscitate: Secondary | ICD-10-CM | POA: Diagnosis present

## 2019-05-29 DIAGNOSIS — Z8679 Personal history of other diseases of the circulatory system: Secondary | ICD-10-CM

## 2019-05-29 DIAGNOSIS — Z888 Allergy status to other drugs, medicaments and biological substances status: Secondary | ICD-10-CM | POA: Diagnosis not present

## 2019-05-29 DIAGNOSIS — Z981 Arthrodesis status: Secondary | ICD-10-CM | POA: Diagnosis not present

## 2019-05-29 DIAGNOSIS — Z7952 Long term (current) use of systemic steroids: Secondary | ICD-10-CM | POA: Diagnosis not present

## 2019-05-29 DIAGNOSIS — N1831 Chronic kidney disease, stage 3a: Secondary | ICD-10-CM | POA: Diagnosis not present

## 2019-05-29 DIAGNOSIS — Z20822 Contact with and (suspected) exposure to covid-19: Secondary | ICD-10-CM | POA: Diagnosis not present

## 2019-05-29 DIAGNOSIS — I509 Heart failure, unspecified: Secondary | ICD-10-CM

## 2019-05-29 DIAGNOSIS — R197 Diarrhea, unspecified: Secondary | ICD-10-CM

## 2019-05-29 DIAGNOSIS — A0472 Enterocolitis due to Clostridium difficile, not specified as recurrent: Secondary | ICD-10-CM | POA: Diagnosis not present

## 2019-05-29 DIAGNOSIS — M810 Age-related osteoporosis without current pathological fracture: Secondary | ICD-10-CM | POA: Diagnosis present

## 2019-05-29 DIAGNOSIS — M069 Rheumatoid arthritis, unspecified: Secondary | ICD-10-CM | POA: Diagnosis present

## 2019-05-29 DIAGNOSIS — G8929 Other chronic pain: Secondary | ICD-10-CM | POA: Diagnosis present

## 2019-05-29 DIAGNOSIS — M353 Polymyalgia rheumatica: Secondary | ICD-10-CM | POA: Diagnosis present

## 2019-05-29 LAB — BASIC METABOLIC PANEL
Anion gap: 11 (ref 5–15)
BUN: 13 mg/dL (ref 8–23)
CO2: 28 mmol/L (ref 22–32)
Calcium: 8.4 mg/dL — ABNORMAL LOW (ref 8.9–10.3)
Chloride: 102 mmol/L (ref 98–111)
Creatinine, Ser: 0.89 mg/dL (ref 0.44–1.00)
GFR calc Af Amer: >60 mL/min (ref >60)
GFR calc non Af Amer: >60 mL/min (ref >60)
Glucose, Bld: 118 mg/dL — ABNORMAL HIGH (ref 70–99)
Potassium: 4.2 mmol/L (ref 3.5–5.1)
Sodium: 141 mmol/L (ref 135–145)

## 2019-05-29 LAB — URINE CULTURE: Culture: NO GROWTH

## 2019-05-29 LAB — CBC
HCT: 33.7 % — ABNORMAL LOW (ref 36.0–46.0)
Hemoglobin: 10.6 g/dL — ABNORMAL LOW (ref 12.0–15.0)
MCH: 28.9 pg (ref 26.0–34.0)
MCHC: 31.5 g/dL (ref 30.0–36.0)
MCV: 91.8 fL (ref 80.0–100.0)
Platelets: 171 10*3/uL (ref 150–400)
RBC: 3.67 MIL/uL — ABNORMAL LOW (ref 3.87–5.11)
RDW: 15.7 % — ABNORMAL HIGH (ref 11.5–15.5)
WBC: 13.2 10*3/uL — ABNORMAL HIGH (ref 4.0–10.5)
nRBC: 0 % (ref 0.0–0.2)

## 2019-05-29 LAB — CLOSTRIDIUM DIFFICILE BY PCR, REFLEXED: Toxigenic C. Difficile by PCR: POSITIVE — AB

## 2019-05-29 LAB — URINALYSIS, ROUTINE W REFLEX MICROSCOPIC
Bilirubin Urine: NEGATIVE
Glucose, UA: NEGATIVE mg/dL
Hgb urine dipstick: NEGATIVE
Ketones, ur: NEGATIVE mg/dL
Leukocytes,Ua: NEGATIVE
Nitrite: NEGATIVE
Protein, ur: NEGATIVE mg/dL
Specific Gravity, Urine: 1.018 (ref 1.005–1.030)
pH: 6 (ref 5.0–8.0)

## 2019-05-29 LAB — C DIFFICILE QUICK SCREEN W PCR REFLEX
C Diff antigen: POSITIVE — AB
C Diff toxin: NEGATIVE

## 2019-05-29 LAB — LACTIC ACID, PLASMA
Lactic Acid, Venous: 1.6 mmol/L (ref 0.5–1.9)
Lactic Acid, Venous: 1.7 mmol/L (ref 0.5–1.9)
Lactic Acid, Venous: 2.9 mmol/L (ref 0.5–1.9)

## 2019-05-29 LAB — LIPASE, BLOOD: Lipase: 30 U/L (ref 11–51)

## 2019-05-29 LAB — SARS CORONAVIRUS 2 (TAT 6-24 HRS): SARS Coronavirus 2: NEGATIVE

## 2019-05-29 LAB — TROPONIN I (HIGH SENSITIVITY): Troponin I (High Sensitivity): 12 ng/L (ref <18)

## 2019-05-29 MED ORDER — ACETAMINOPHEN 650 MG RE SUPP: 650.0000 mg | Freq: Four times a day (QID) | RECTAL | Status: DC | PRN

## 2019-05-29 MED ORDER — DILTIAZEM HCL ER COATED BEADS 180 MG PO CP24
180.0000 mg | ORAL_CAPSULE | Freq: Every day | ORAL | Status: DC
  Administered 2019-05-29 – 2019-06-01 (×4): 180 mg via ORAL
  Filled 2019-05-29 (×4): qty 1

## 2019-05-29 MED ORDER — ESCITALOPRAM OXALATE 10 MG PO TABS
10.0000 mg | ORAL_TABLET | Freq: Every day | ORAL | Status: DC
  Administered 2019-05-29 – 2019-06-01 (×4): 10 mg via ORAL
  Filled 2019-05-29 (×4): qty 1

## 2019-05-29 MED ORDER — ASCORBIC ACID 500 MG PO TABS
1000.0000 mg | ORAL_TABLET | Freq: Every day | ORAL | Status: DC
  Administered 2019-05-29 – 2019-06-01 (×4): 1000 mg via ORAL
  Filled 2019-05-29 (×4): qty 2

## 2019-05-29 MED ORDER — METRONIDAZOLE IN NACL 5-0.79 MG/ML-% IV SOLN
500.0000 mg | Freq: Three times a day (TID) | INTRAVENOUS | Status: DC
  Administered 2019-05-29: 500 mg via INTRAVENOUS
  Filled 2019-05-29: qty 100

## 2019-05-29 MED ORDER — METOPROLOL TARTRATE 5 MG/5ML IV SOLN: 5.0000 mg | Freq: Once | INTRAVENOUS | Status: AC

## 2019-05-29 MED ORDER — BACID PO TABS
2.0000 | ORAL_TABLET | Freq: Three times a day (TID) | ORAL | Status: DC
  Administered 2019-05-29 – 2019-06-01 (×11): 2 via ORAL
  Filled 2019-05-29 (×12): qty 2

## 2019-05-29 MED ORDER — HYDROCODONE-ACETAMINOPHEN 5-325 MG PO TABS
1.0000 | ORAL_TABLET | Freq: Four times a day (QID) | ORAL | Status: DC | PRN
  Administered 2019-05-29 – 2019-06-01 (×14): 1 via ORAL
  Filled 2019-05-29 (×14): qty 1

## 2019-05-29 MED ORDER — VANCOMYCIN 50 MG/ML ORAL SOLUTION
125.0000 mg | Freq: Four times a day (QID) | ORAL | Status: DC
  Administered 2019-05-29 – 2019-06-01 (×15): 125 mg via ORAL
  Filled 2019-05-29 (×17): qty 2.5

## 2019-05-29 MED ORDER — PANTOPRAZOLE SODIUM 40 MG PO TBEC
40.0000 mg | DELAYED_RELEASE_TABLET | Freq: Every day | ORAL | Status: DC
  Administered 2019-05-29: 40 mg via ORAL
  Filled 2019-05-29: qty 1

## 2019-05-29 MED ORDER — METOPROLOL TARTRATE 5 MG/5ML IV SOLN
INTRAVENOUS | Status: AC
  Administered 2019-05-29: 5 mg
  Filled 2019-05-29: qty 5

## 2019-05-29 MED ORDER — VITAMIN E 180 MG (400 UNIT) PO CAPS
400.0000 [IU] | ORAL_CAPSULE | Freq: Every day | ORAL | Status: DC
  Administered 2019-05-29 – 2019-06-01 (×4): 400 [IU] via ORAL
  Filled 2019-05-29 (×4): qty 1

## 2019-05-29 MED ORDER — LACTATED RINGERS IV BOLUS
1000.0000 mL | Freq: Once | INTRAVENOUS | Status: AC
  Administered 2019-05-29: 1000 mL via INTRAVENOUS

## 2019-05-29 MED ORDER — TRAMADOL HCL 50 MG PO TABS
50.0000 mg | ORAL_TABLET | Freq: Two times a day (BID) | ORAL | Status: DC | PRN
  Administered 2019-05-29 – 2019-05-31 (×5): 50 mg via ORAL
  Filled 2019-05-29 (×6): qty 1

## 2019-05-29 MED ORDER — PREDNISONE 10 MG PO TABS
10.0000 mg | ORAL_TABLET | Freq: Every day | ORAL | Status: DC
  Administered 2019-05-29 – 2019-06-01 (×4): 10 mg via ORAL
  Filled 2019-05-29 (×4): qty 1

## 2019-05-29 MED ORDER — ACETAMINOPHEN 325 MG PO TABS
650.0000 mg | ORAL_TABLET | Freq: Four times a day (QID) | ORAL | Status: DC | PRN
  Administered 2019-05-29 – 2019-05-31 (×2): 650 mg via ORAL
  Filled 2019-05-29 (×2): qty 2

## 2019-05-29 MED ORDER — FERROUS SULFATE 325 (65 FE) MG PO TABS
325.0000 mg | ORAL_TABLET | Freq: Two times a day (BID) | ORAL | Status: DC
  Administered 2019-05-29 – 2019-06-01 (×8): 325 mg via ORAL
  Filled 2019-05-29 (×8): qty 1

## 2019-05-29 MED ORDER — APIXABAN 5 MG PO TABS
5.0000 mg | ORAL_TABLET | Freq: Two times a day (BID) | ORAL | Status: DC
  Administered 2019-05-29 – 2019-06-01 (×7): 5 mg via ORAL
  Filled 2019-05-29 (×7): qty 1

## 2019-05-29 MED ORDER — VITAMIN D 25 MCG (1000 UNIT) PO TABS
1000.0000 [IU] | ORAL_TABLET | Freq: Every day | ORAL | Status: DC
  Administered 2019-05-29 – 2019-06-01 (×4): 1000 [IU] via ORAL
  Filled 2019-05-29 (×4): qty 1

## 2019-05-29 MED ORDER — METOPROLOL TARTRATE 100 MG PO TABS
100.0000 mg | ORAL_TABLET | Freq: Two times a day (BID) | ORAL | Status: DC
  Administered 2019-05-29 – 2019-06-01 (×6): 100 mg via ORAL
  Filled 2019-05-29 (×7): qty 1

## 2019-05-29 NOTE — H&P (Signed)
History and Physical    Betty Jackson [id number] DOB: [birth year] DOA: 05/28/2019  PCP: Eulas Post, MD Patient coming from: Home  Chief Complaint: Fever  HPI: Betty Jackson is a 85 y.o. female with medical history significant of CKD stage III, CAD status post PCI, chronic atrial fibrillation, diastolic congestive heart failure, CVA, hypertension, hyperlipidemia, giant cell arteritis, RA on chronic steroids, chronic back pain status post L2-3 and L3-4 posterior fusion in 2015 and an L5-S1 microdiscectomy in 2017 presenting to the ED with complaints of fever and not feeling well.  Patient states she had a fever of 102.5 F today.  States she received her second Covid vaccine over 3 weeks ago.  Reports having a chronic cough secondary to vocal cord dysfunction.  No chest pain or shortness of breath.  Reports chronic intermittent right lower quadrant abdominal pain for which she is supposed to see a gastroenterologist.  Denies dysuria.  States she had dry heaves upon arriving to the ED but no nausea or vomiting at home.  She has been able to tolerate p.o. intake at home without any difficulty.  Does report having diarrhea for the past few days.  Reports having chronic lower back pain due to history of prior surgeries for which she takes hydrocodone, no recent change.  ED Course: Febrile with temperature 102 F.  Not tachycardic or hypotensive.  Slightly tachypneic but not hypoxic.  WBC count 20.3.  Lactic acid 2.4.  UA and urine culture pending.  Blood culture x2 pending.  SARS-CoV-2 rapid antigen test negative, PCR test pending.  High-sensitivity troponin level pending.  Lipase level pending.  LFTs normal. Chest x-ray showing mild increased central vascular congestion new from prior exam.  No interstitial edema noted.   CT abdomen pelvis showing sigmoid diverticulosis with mild perisigmoid stranding thought to be likely chronic.  Mild acute diverticulitis favored to be less likely.  No evidence of  bowel obstruction. Patient received Tylenol, fentanyl, Zofran, vancomycin, cefepime, metronidazole, and 1 L LR bolus.  Review of Systems:  All systems reviewed and apart from history of presenting illness, are negative.  Past Medical History:  Diagnosis Date  . Abdominal pain, epigastric 06/22/2013  . Abdominal pain, other specified site 12/24/2011  . Abnormal urinalysis 05/20/2017  . Acute respiratory failure with hypoxia ([place]) 05/20/2017  . Anemia, unspecified 10/28/2012  . Anxiety state, unspecified 10/28/2012  . Bruises easily    d/t being on prednisone and plavix  . CAD (coronary artery disease)    a. Stent RCA in [facility];  b. Cath approx 2009 - nonobs per pt report.  . Cataract    immature on the left eye  . Chronic insomnia 02/07/2013  . Chronic lower back pain    scoliosis  . CKD (chronic kidney disease) stage 3, GFR 30-59 ml/min   . Complication of anesthesia    pt has a very high tolerance to meds  . Coronary disease 05/20/2017  . CVA (cerebral infarction) 10/29/2012  . DDD (degenerative disc disease)   . Depression   . Diverticulitis    hx of  . Diverticulosis   . Elevated transaminase level 02/07/2013  . Enteritis   . GERD (gastroesophageal reflux disease) 09/01/2010  . Giant cell arteritis ([place])   . Hemorrhoids   . Herniated nucleus pulposus, L5-S1, right 11/04/2015  . History of scabies   . HTN (hypertension) 05/20/2017  . Hyperlipidemia    takes Lipitor daily  . Hypertension    takes Amlodipine,Losartan,Metoprolol,and HCTZ  daily  . Incontinence of urine   . Influenza A 05/20/2017  . Insomnia    takes Restoril nightly  . Lumbar stenosis 04/25/2013  . Major depressive disorder, recurrent episode, moderate ([place]) 07/16/2013  . Migraines    "back in my 20's; none since" (10/28/2012)  . Osteoporosis   . PAF (paroxysmal atrial fibrillation) ([place]) 2011   a. lone epidode in 2011 according to notes.  . Rheumatoid arthritis ([place])   . Scoliosis   . Sepsis ([place])  05/20/2017  . Sinus bradycardia    a. on chronic bb  . Temporal arteritis ([place]) 2011   a. followed @ [facility]; potential flareup 10/28/2012/notes 10/28/2012  . Vocal cord dysfunction    "they don't operate properly" (10/28/2012)    Past Surgical History:  Procedure Laterality Date  . ABDOMINAL HYSTERECTOMY  ~ 1984   vaginally  . BACK SURGERY  7-44yrs ago   X Stop  . BLADDER SUSPENSION  2001  . BREAST BIOPSY Right   . CATARACT EXTRACTION W/ INTRAOCULAR LENS IMPLANT Right ~ 08/2012  . CHEST TUBE INSERTION Left 03/08/2019   Procedure: Chest Tube Insertion;  Surgeon: Ivin Poot, MD;  Location: [place];  Service: Thoracic;  Laterality: Left;  . COLONOSCOPY  01/26/2012   Procedure: COLONOSCOPY;  Surgeon: Ladene Artist, MD,FACG;  Location[facility] ENDOSCOPY;  Service: Endoscopy;  Laterality: N/A;  note the EGD is possible  . CORONARY ANGIOPLASTY WITH STENT PLACEMENT  2006   X 1 stent  . EPIDURAL BLOCK INJECTION    . ESOPHAGOGASTRODUODENOSCOPY  01/26/2012   Procedure: ESOPHAGOGASTRODUODENOSCOPY (EGD);  Surgeon: Ladene Artist, MD,FACG;  Location: [facility] ENDOSCOPY;  Service: Endoscopy;  Laterality: N/A;  . HEMIARTHROPLASTY HIP Right 2012  . IR EPIDUROGRAPHY  04/20/2019  . LAPAROSCOPIC CHOLECYSTECTOMY  2001  . LUMBAR FUSION  03/2013  . LUMBAR LAMINECTOMY/DECOMPRESSION MICRODISCECTOMY Right 11/04/2015   Procedure: Right Lumbar Five-Sacral One Microdiskectomy;  Surgeon: Kristeen Miss, MD;  Location: [place] NEURO ORS;  Service: Neurosurgery;  Laterality: Right;  Right L5-S1 Microdiskectomy  . moles removed that required stiches     one on leg and one on face  . SUBXYPHOID PERICARDIAL WINDOW N/A 03/08/2019   Procedure: SUBXYPHOID PERICARDIAL WINDOW;  Surgeon: Ivin Poot, MD;  Location: [place];  Service: Thoracic;  Laterality: N/A;  . TEE WITHOUT CARDIOVERSION N/A 03/08/2019   Procedure: TRANSESOPHAGEAL ECHOCARDIOGRAM (TEE);  Surgeon: Prescott Gum, Collier Salina, MD;  Location: [place];  Service: Thoracic;  Laterality: N/A;  .  TEMPORAL ARTERY BIOPSY / LIGATION Bilateral 2011  . TONSILLECTOMY AND ADENOIDECTOMY     at age 80  . TUBAL LIGATION  ~ 1982  . X-STOP IMPLANTATION  ~ 2010   "lower back" (10/28/2012)     reports that she has never smoked. She has never used smokeless tobacco. She reports current alcohol use. She reports that she does not use drugs.  Allergies  Allergen Reactions  . Dextromethorphan Rash    Family History  Problem Relation Age of Onset  . Brain cancer Mother 28  . Heart attack Father   . Breast cancer Daughter 61  . Heart disease Other   . Hypertension Other   . Arthritis Neg Hx   . Colon cancer Neg Hx   . Osteoporosis Neg Hx     Prior to Admission medications   Medication Sig Start Date End Date Taking? Authorizing Provider  alendronate (FOSAMAX) 70 MG tablet Take 1 tablet (70 mg total) by mouth every 7 (seven) days. Take with a  full glass of water on an empty stomach. Patient taking differently: Take 70 mg by mouth every Tuesday. Take with a full glass of water on an empty stomach. 04/28/19   Burchette, Alinda Sierras, MD  Cholecalciferol (VITAMIN D-3) 25 MCG (1000 UT) CAPS Take 1,000 Units by mouth daily.    [provider]  dicyclomine (BENTYL) 20 MG tablet Take 1 tablet (20 mg total) by mouth 2 (two) times daily as needed for spasms. Patient not taking: Reported on 05/25/2019 01/12/19   Gareth Morgan, MD  diltiazem (CARDIZEM CD) 180 MG 24 hr capsule Take 1 capsule (180 mg total) by mouth daily. 05/25/19   Burnell Blanks, MD  ELIQUIS 5 MG TABS tablet TAKE 1 TABLET BY MOUTH TWICE A DAY Patient taking differently: Take 5 mg by mouth 2 (two) times daily.  05/15/19   Burnell Blanks, MD  escitalopram (LEXAPRO) 10 MG tablet TAKE 1 TABLET BY MOUTH EVERY DAY Patient taking differently: Take 10 mg by mouth daily.  12/14/18   Burchette, Alinda Sierras, MD  ferrous sulfate 325 (65 FE) MG tablet Take 1 tablet (325 mg total) by mouth 2 (two) times daily with a meal. 04/21/19    Oswald Hillock, MD  furosemide (LASIX) 20 MG tablet Take 2 tablets (40 mg total) by mouth daily. Patient taking differently: Take 40 mg by mouth 2 (two) times daily.  04/21/19 05/25/19  Oswald Hillock, MD  HYDROcodone-acetaminophen (NORCO/VICODIN) 5-325 MG tablet Take one tablet by mouth every 8 hours as needed for pain. May refill in two months. Patient taking differently: Take 1 tablet by mouth every 6 (six) hours as needed (for back pain).  08/05/18   Burchette, Alinda Sierras, MD  lidocaine (XYLOCAINE) 5 % ointment Apply 1 application topically 3 (three) times daily as needed. Patient not taking: Reported on 05/25/2019 04/13/19   Long, Wonda Olds, MD  losartan (COZAAR) 100 MG tablet Take 100 mg by mouth daily.    [provider]  metoprolol tartrate (LOPRESSOR) 100 MG tablet Take 1 tablet (100 mg total) by mouth 2 (two) times daily. Please schedule an appt for further refills 1st attempt Patient taking differently: Take 100 mg by mouth 2 (two) times daily.  04/26/19   Burnell Blanks, MD  ondansetron (ZOFRAN ODT) 4 MG disintegrating tablet Take 1 tablet (4 mg total) by mouth every 8 (eight) hours as needed for nausea or vomiting. Patient taking differently: Take 4 mg by mouth every 8 (eight) hours as needed for nausea or vomiting (DISSOLVE IN THE MOUTH).  01/12/19   Gareth Morgan, MD  pantoprazole (PROTONIX) 40 MG tablet TAKE 1 TABLET BY MOUTH EVERY DAY Patient taking differently: Take 40 mg by mouth daily.  02/17/19   Burchette, Alinda Sierras, MD  potassium chloride SA (KLOR-CON) 10 MEQ tablet Take 1 tablet (10 mEq total) by mouth daily. 04/21/19   Oswald Hillock, MD  predniSONE (DELTASONE) 10 MG tablet Take 1 tablet (10 mg total) by mouth daily. 04/21/19   Oswald Hillock, MD  vitamin C (ASCORBIC ACID) 500 MG tablet Take 1,000 mg by mouth daily.     [provider]  vitamin E (VITAMIN E) 400 UNIT capsule Take 400 Units by mouth daily.    [provider]    Physical Exam: Vitals:     05/28/19 2343 05/29/19 0009 05/29/19 0045 05/29/19 0100  BP:   (!) 116/91 (!) 107/58  Pulse: 76  74 75  Resp: (!) 23  (!) 25  18  Temp:  98.2 F (36.8 C)    TempSrc:  Oral    SpO2: 97%  97% 96%  Weight:      Height:        Physical Exam  Constitutional: She is oriented to person, place, and time. She appears well-developed and well-nourished. No distress.  HENT:  Head: Normocephalic.  Eyes: Right eye exhibits no discharge. Left eye exhibits no discharge.  Cardiovascular: Normal rate, regular rhythm and intact distal pulses.  Pulmonary/Chest: Effort normal and breath sounds normal. No respiratory distress. She has no wheezes. She has no rales.  Abdominal: Soft. Bowel sounds are normal. She exhibits no distension. There is abdominal tenderness. There is no rebound and no guarding.  Right lower quadrant tender to palpation No left lower quadrant tenderness  Musculoskeletal:        General: No edema.     Cervical back: Neck supple.  Neurological: She is alert and oriented to person, place, and time.  Skin: Skin is warm and dry. She is not diaphoretic.     Labs on Admission: I have personally reviewed following labs and imaging studies  CBC: Recent Labs  Lab 05/25/19 1907 05/28/19 2009  WBC 12.1* 20.3*  NEUTROABS  --  17.2*  HGB 10.9* 12.6  HCT 35.0* 39.6  MCV 93.6 92.5  PLT 189 [id number]   Basic Metabolic Panel: Recent Labs  Lab 05/25/19 1907 05/28/19 2009  NA 137 136  K 4.3 4.3  CL 105 100  CO2 21* 24  GLUCOSE 109* 153*  BUN 25* 14  CREATININE 1.18* 0.94  CALCIUM 8.3* 9.0   GFR: Estimated Creatinine Clearance: 44.3 mL/min (by C-G formula based on SCr of 0.94 mg/dL). Liver Function Tests: Recent Labs  Lab 05/28/19 2009  AST 21  ALT 12  ALKPHOS 76  BILITOT 1.1  PROT 6.9  ALBUMIN 3.6   Recent Labs  Lab 05/28/19 2348  LIPASE 30   No results for input(s): AMMONIA in the last 168 hours. Coagulation Profile: Recent Labs  Lab 05/28/19 2058  INR 1.4*    Cardiac Enzymes: No results for input(s): CKTOTAL, CKMB, CKMBINDEX, TROPONINI in the last 168 hours. BNP (last 3 results) No results for input(s): PROBNP in the last 8760 hours. HbA1C: No results for input(s): HGBA1C in the last 72 hours. CBG: Recent Labs  Lab 05/25/19 2053  GLUCAP 103*   Lipid Profile: No results for input(s): CHOL, HDL, LDLCALC, TRIG, CHOLHDL, LDLDIRECT in the last 72 hours. Thyroid Function Tests: No results for input(s): TSH, T4TOTAL, FREET4, T3FREE, THYROIDAB in the last 72 hours. Anemia Panel: No results for input(s): VITAMINB12, FOLATE, FERRITIN, TIBC, IRON, RETICCTPCT in the last 72 hours. Urine analysis:    Component Value Date/Time   COLORURINE STRAW (A) 05/25/2019 2000   APPEARANCEUR CLEAR 05/25/2019 2000   LABSPEC 1.004 (L) 05/25/2019 2000   PHURINE 6.0 05/25/2019 2000   GLUCOSEU NEGATIVE 05/25/2019 2000   GLUCOSEU NEGATIVE 07/31/2016 1018   HGBUR NEGATIVE 05/25/2019 2000   BILIRUBINUR NEGATIVE 05/25/2019 2000   BILIRUBINUR neg 07/31/2016 1012   KETONESUR NEGATIVE 05/25/2019 2000   PROTEINUR NEGATIVE 05/25/2019 2000   UROBILINOGEN 0.2 07/31/2016 1018   UROBILINOGEN 0.2 07/31/2016 1012   NITRITE NEGATIVE 05/25/2019 2000   LEUKOCYTESUR NEGATIVE 05/25/2019 2000    Radiological Exams on Admission: CT Abdomen Pelvis W Contrast  Result Date: 05/28/2019 CLINICAL DATA:  85 year old female with abdominal pain, nausea vomiting. EXAM: CT ABDOMEN AND PELVIS WITH CONTRAST TECHNIQUE: Multidetector CT imaging of the abdomen  and pelvis was performed using the standard protocol following bolus administration of intravenous contrast. CONTRAST:  161mL OMNIPAQUE IOHEXOL 300 MG/ML  SOLN COMPARISON:  CT of the abdomen pelvis dated 04/15/2019. FINDINGS: Lower chest: Borderline cardiomegaly. Multi vessel coronary vascular calcification. Partially visualized pericardial effusion measures 9 mm in thickness similar to prior CT. Diffuse interstitial and interlobular  septal prominence of the visualized lung bases may represent edema. Partially visualized trace left pleural effusion. No intra-abdominal free air or free fluid. Hepatobiliary: The liver is grossly unremarkable. Mild intrahepatic biliary ductal dilatation as well as mild dilatation of the common bile duct likely post cholecystectomy. Pancreas: Unremarkable. No pancreatic ductal dilatation or surrounding inflammatory changes. Spleen: Normal in size without focal abnormality. Adrenals/Urinary Tract: The adrenal glands are unremarkable. There is no hydronephrosis on either side. There is symmetric enhancement and excretion of contrast by both kidneys. Subcentimeter bilateral renal hypodense lesions are too small to characterize. The visualized ureters appear unremarkable. The urinary bladder is suboptimally evaluated due to streak artifact caused by right hip arthroplasty. Stomach/Bowel: There is sigmoid diverticulosis. Mild perisigmoid stranding may be chronic. Mild acute diverticulitis is not excluded. Clinical correlation is recommended. There is no bowel obstruction. The appendix is normal. Vascular/Lymphatic: Moderate aortoiliac atherosclerotic disease. The IVC is unremarkable. No portal venous gas. There is no adenopathy. Reproductive: Hysterectomy. No adnexal masses. Other: Small fat containing umbilical hernia. Musculoskeletal: Osteopenia with extensive degenerative changes of the spine. L2-L4 posterior fusion, old T11 compression fracture and vertebroplasty as well as L1 vertebroplasty. Total right hip arthroplasty. Similar fragmented appearance of the left ischial tuberosity, previously suggested to demonstrate a pathologic fracture. Clinical correlation is recommended. No new fracture. IMPRESSION: 1. Sigmoid diverticulosis. Mild perisigmoid stranding, likely chronic. Mild acute diverticulitis is favored less likely but not excluded. Clinical correlation is recommended. No bowel obstruction. Normal appendix.  2. Additional nonacute findings as above and similar to prior CT including Aortic Atherosclerosis (ICD10-I70.0). Electronically Signed   By: Anner Crete M.D.   On: 05/28/2019 23:47   DG Chest Port 1 View  Result Date: 05/28/2019 CLINICAL DATA:  Fevers and weakness EXAM: PORTABLE CHEST 1 VIEW COMPARISON:  05/25/2019 FINDINGS: Cardiac shadow is again mildly enlarged but accentuated by the portable technique. The lungs are well aerated bilaterally. Very mild vascular congestion is noted increased from the prior study. No interstitial edema is noted. No bony abnormality is seen. IMPRESSION: Mild increased central vascular congestion new from the prior exam. Electronically Signed   By: Inez Catalina M.D.   On: 05/28/2019 21:21    EKG: Independently reviewed.  Sinus rhythm with PVCs.  Nonspecific T wave abnormality.  Assessment/Plan Principal Problem:   Sepsis ([place]) Active Problems:   History of atrial fibrillation   CKD (chronic kidney disease), stage III   Diarrhea   CHF (congestive heart failure) (HCC)   Sepsis from unknown source: Febrile and labs showing significant leukocytosis.  Mild lactic acidosis resolved after fluid bolus.  Chest x-ray not suggestive of pneumonia.  SARS-CoV-2 rapid antigen test negative, PCR test pending.  Low suspicion for COVID-19 as patient has already received her vaccinations over 3 weeks ago.  UA to assess for UTI currently pending.  Urine culture pending.  Blood culture x2 pending. CT abdomen pelvis showing sigmoid diverticulosis with mild perisigmoid stranding thought to be likely chronic.  Feel acute sigmoid diverticulitis is less likely as patient does not have any left lower quadrant pain or tenderness on exam.  Instead, she is complaining of chronic intermittent right lower  quadrant abdominal tenderness.  Appendix normal on CT.  Does endorse diarrhea but CT without evidence of colitis.  Does have history of back surgeries and chronic back pain but patient  reports no recent change.  Plan is to continue broad-spectrum antibiotics including vancomycin, cefepime, and metronidazole.  Tylenol as needed. Repeat lactic acid level.  Follow-up UA, urine culture, and blood cultures.  Continue to monitor WBC count.  Diarrhea: No evidence of colitis on CT.  Does have significant leukocytosis.  Will check C. difficile PCR and GI pathogen panel.  Chronic diastolic congestive heart failure: Echo done 04/17/2019 with normal systolic function (EF 60 to 65%) and grade 2 diastolic dysfunction.  Chest x-ray showing mild increased central vascular congestion new from prior exam.  No interstitial edema noted.  Not hypoxic.  No significant elevation of BNP.  Does not appear volume overloaded on exam.  Will hold off giving a diuretic at this time in the setting of sepsis.   CKD stage III: Stable.  Renal function at baseline.   Atrial fibrillation: Stable.  Currently in sinus rhythm.  Continue home Eliquis.  Blood pressure stable, continue home Cardizem and Lopressor.  Rheumatoid arthritis: Chronically on prednisone, continue.  History of back surgeries and chronic back pain: Continue home Norco.  DVT prophylaxis: Eliquis Code Status: Patient wishes to be DNR. Family Communication: No family available at this time. Disposition Plan: Anticipate discharge after clinical improvement.  Currently undergoing work-up for sepsis from unknown source.  She will likely stay in the hospital for more than 2 midnights. Consults called: None Admission status: It is my clinical opinion that admission to INPATIENT is reasonable and necessary because of the expectation that this patient will require hospital care that crosses at least 2 midnights to treat this condition based on the medical complexity of the problems presented.  Given the aforementioned information, the predictability of an adverse outcome is felt to be significant.  The medical decision making on this patient was of high  complexity and the patient is at high risk for clinical deterioration, therefore this is a level 3 visit.  Shela Leff MD [facility]  If 7PM-7AM, please contact night-coverage [email or web address]  05/29/2019, 1:21 AM

## 2019-05-29 NOTE — Progress Notes (Signed)
Daughter called wanted an update on mother. RN answered all questions. Daughter would like a GI consult for pt. RN will let on coming shift know.   Eleanora Neighbor, RN

## 2019-05-29 NOTE — Plan of Care (Signed)
  Problem: Clinical Measurements: Goal: Ability to maintain clinical measurements within normal limits will improve Outcome: Progressing   

## 2019-05-29 NOTE — Progress Notes (Signed)
Notified MD Shahmehdi via amion page that pt's HR is sustaining at 160-170s. Pt does not have PRN for elevated HR. MD called back and stated he would order medications to help decrease heart rate.   Paulla Fore, RN, BSN

## 2019-05-29 NOTE — Progress Notes (Signed)
New Admission Note:  Arrival Method: By bed from ED around 0300 Mental Orientation: Alert and oriented x 4 Telemetry: Box 19, completed Assessment: Completed Skin: Completed, refer to flowsheets IV: Left forearm S.L.  Pain: Chronic back pain, just had pain medication Tubes: None Safety Measures: Safety Fall Prevention Plan was given, discussed and signed. Admission: Completed [facility] Orientation: Patient has been orientated to the room, unit and the staff. Family: None  Orders have been reviewed and implemented. Will continue to monitor the patient. Call light has been placed within reach and bed alarm has been activated.   Perry Mount, RN  Phone Number: [phone]

## 2019-05-29 NOTE — Progress Notes (Signed)
Pt wants all bed rails up so she does not fall out of the bed. She states she rolls a lot.   Eleanora Neighbor, RN

## 2019-05-29 NOTE — Progress Notes (Signed)
PROGRESS NOTE    Patient: Betty Jackson                            PCP: Eulas Post, MD                    DOB: 02-26-1935            DOA: 05/28/2019 WFU:[id number]             DOS: 05/29/2019, 10:35 AM   LOS: 0 days   Date of Service: The patient was seen and examined on 05/29/2019  Subjective:   The patient was seen and examined this morning, remained stable, complaining of some back pain.  Stating she is not feeling well, but no specific complaints.  Just generalized weakness Denies any chest pain or shortness of breath.   Brief Narrative:   Betty Jackson is a 85 y.o. female with medical history significant of CKD stage III, CAD status post PCI, chronic atrial fibrillation, diastolic congestive heart failure, CVA, hypertension, hyperlipidemia, giant cell arteritis, RA on chronic steroids, chronic back pain status post L2-3 and L3-4 posterior fusion in 2015 and an L5-S1 microdiscectomy in 2017 presenting to the ED with complaints of fever and not feeling well.  Patient states she had a fever of 102.5 F today.  States she received her second Covid vaccine over 3 weeks ago.  Reports having a chronic cough secondary to vocal cord dysfunction.    Does report having diarrhea for the past few days.  Reports having chronic lower back pain due to history of prior surgeries for which she takes hydrocodone, no recent change.  Upon arrival patient met the early criteria for sepsis/SIRS  Febrile with temperature 102 F.  Not tachycardic or hypotensive.  Slightly tachypneic but not hypoxic.  WBC count 20.3.  Lactic acid 2.4.  UA and urine culture pending.  Blood culture x2 pending.  SARS-CoV-2 rapid antigen test negative, PCR test pending.    Troponin negative, lipase not elevated,  LFTs normal. Chest x-ray showing mild increased central vascular congestion new from prior exam.  No interstitial edema noted.   CT abdomen pelvis showing sigmoid diverticulosis with mild perisigmoid stranding thought to  be likely chronic.  Mild acute diverticulitis favored to be less likely.  No evidence of bowel obstruction. Patient received Tylenol, fentanyl, Zofran, vancomycin, cefepime, metronidazole, and 1 L LR bolus.  05/29/2019- Subsequently patient stool was positive for C. difficile colitis. IV antibiotics:  IV vancomycin cefepime and IV Flagyl was DC'd, patient started on p.o. vancomycin. Continue with IV fluid hydration, monitoring lactic acid    Assessment & Plan:   Principal Problem:   Sepsis ([place]) Active Problems:   History of atrial fibrillation   CKD (chronic kidney disease), stage III   Diarrhea   CHF (congestive heart failure) (HCC)    Sepsis -C. difficile colitis -Likely source C. difficile colitis -Stool analysis positive for C. difficile colitis  -Febrile and labs showing significant leukocytosis.  Mild lactic acidosis resolved after fluid bolus.  -Chest x-ray not suggestive of pneumonia.  SARS-CoV-2 rapid antigen test negative, PCR test pending.  Low suspicion for COVID-19 as patient has already received her vaccinations over 3 weeks ago.  UA to assess for UTI currently pending.  Urine culture pending.  Blood culture x2 pending.  -CT abdomen pelvis showing sigmoid diverticulosis with mild perisigmoid stranding thought to be likely chronic.  -Patient was initiated on  broad-spectrum antibiotics of vancomycin, cefepime and Flagyl--will be DC'd today -Patient is tolerating p.o., will be initiated on p.o. vancomycin, lactobacillus -Patient currently stable afebrile normotensive -Continue gentle IV fluid hydration, monitoring labs including lactic acid   Chronic diastolic congestive heart failure:  Echo done 04/17/2019 with normal systolic function (EF 60 to 65%) and grade 2 diastolic dysfunction.  Chest x-ray showing mild increased central vascular congestion new from prior exam.  No interstitial edema noted.  Not hypoxic.  No significant elevation of BNP.  Does not appear volume  overloaded on exam.   Will hold off giving a diuretic at this time in the setting of sepsis.   CKD stage III: Stable.  Renal function at baseline.   Atrial fibrillation:  Stable.  Currently in sinus rhythm.  Continue home Eliquis.  Blood pressure stable, continue home Cardizem and Lopressor.  Rheumatoid arthritis: Chronically on prednisone, continue.  History of back surgeries and chronic back pain: Continue home Norco.    Cultures;  05/28/2019 Blood/urine/stool cultures >> 05/29/2019 stool positive for C. Difficile  Antimicrobials:  05/28/2019 IV vancomycin, cefepime, Flagyl >> 05/29/2019. 05/29/2019 p.o. vancomycin 125 mg 4 times daily     ================================================================= DVT prophylaxis: Eliquis Code Status: Patient wishes to be DNR. Family Communication: No family available at this time. Disposition Plan:  From home-anticipating to return home once stable Anticipate discharge after clinical improvement.  Currently undergoing work-up for sepsis from C. difficile colitis. Consults called: None Admission status:  INPATIENT -continue IV fluid resuscitation in setting of lactic acidosis, CHF, antibiotics treating sepsis,    Procedures:   No admission procedures for hospital encounter.     Antimicrobials:  Anti-infectives (From admission, onward)   Start     Dose/Rate Route Frequency Ordered Stop   05/29/19 2230  vancomycin (VANCOCIN) IVPB 1000 mg/200 mL premix  Status:  Discontinued     1,000 mg 200 mL/hr over 60 Minutes Intravenous Every 24 hours 05/28/19 2101 05/29/19 1031   05/29/19 1030  vancomycin (VANCOCIN) 50 mg/mL oral solution 125 mg     125 mg Oral 4 times daily 05/29/19 1029 06/08/19 0959   05/29/19 1000  ceFEPIme (MAXIPIME) 2 g in sodium chloride 0.9 % 100 mL IVPB  Status:  Discontinued     2 g 200 mL/hr over 30 Minutes Intravenous Every 12 hours 05/28/19 2101 05/29/19 1031   05/29/19 0600  metroNIDAZOLE (FLAGYL) IVPB 500  mg  Status:  Discontinued     500 mg 100 mL/hr over 60 Minutes Intravenous Every 8 hours 05/29/19 0111 05/29/19 1033   05/28/19 2100  ceFEPIme (MAXIPIME) 2 g in sodium chloride 0.9 % 100 mL IVPB     2 g 200 mL/hr over 30 Minutes Intravenous  Once 05/28/19 2052 05/28/19 2146   05/28/19 2100  metroNIDAZOLE (FLAGYL) IVPB 500 mg     500 mg 100 mL/hr over 60 Minutes Intravenous  Once 05/28/19 2052 05/28/19 2330   05/28/19 2100  vancomycin (VANCOCIN) IVPB 1000 mg/200 mL premix  Status:  Discontinued     1,000 mg 200 mL/hr over 60 Minutes Intravenous  Once 05/28/19 2052 05/28/19 2055   05/28/19 2100  vancomycin (VANCOREADY) IVPB 1500 mg/300 mL     1,500 mg 150 mL/hr over 120 Minutes Intravenous  Once 05/28/19 2055 05/29/19 0318       Medication:  . apixaban  5 mg Oral BID  . vitamin C  1,000 mg Oral Daily  . cholecalciferol  1,000 Units Oral Daily  . diltiazem  180  mg Oral Daily  . escitalopram  10 mg Oral Daily  . ferrous sulfate  325 mg Oral BID WC  . lactobacillus acidophilus  2 tablet Oral TID  . metoprolol tartrate  100 mg Oral BID  . predniSONE  10 mg Oral Q breakfast  . vancomycin  125 mg Oral QID  . vitamin E  400 Units Oral Daily    acetaminophen **OR** acetaminophen, HYDROcodone-acetaminophen   Objective:   Vitals:   05/29/19 0100 05/29/19 0200 05/29/19 0250 05/29/19 0916  BP: (!) 107/58 136/66 (!) 98/58 (!) 100/45  Pulse: 75 77 78 82  Resp: 18 (!) '23 18 18  ' Temp:   98.5 F (36.9 C) 98.6 F (37 C)  TempSrc:   Oral Oral  SpO2: 96% 96% 95% 94%  Weight:      Height:        Intake/Output Summary (Last 24 hours) at 05/29/2019 1035 Last data filed at 05/29/2019 9201 Gross per 24 hour  Intake 498.02 ml  Output --  Net 498.02 ml   Filed Weights   05/28/19 1958  Weight: 70 kg     Examination:   Physical Exam  Constitution:  Alert, cooperative, no distress,  Appears calm and comfortable  Psychiatric: Normal and stable mood and affect, cognition intact,    HEENT: Normocephalic, PERRL, otherwise with in Normal limits  Chest:Chest symmetric Cardio vascular:  S1/S2, RRR, No murmure, No Rubs or Gallops  pulmonary: Clear to auscultation bilaterally, respirations unlabored, negative wheezes / crackles Abdomen: Soft, non-tender, non-distended, bowel sounds,no masses, no organomegaly Muscular skeletal: Limited exam - in bed, able to move all 4 extremities, Normal strength,  Neuro: CNII-XII intact. , normal motor and sensation, reflexes intact  Extremities: No pitting edema lower extremities, +2 pulses  Skin: Dry, warm to touch, negative for any Rashes, No open wounds Wounds: per nursing documentation     LABs:  CBC Latest Ref Rng & Units 05/29/2019 05/28/2019 05/25/2019  WBC 4.0 - 10.5 K/uL 13.2(H) 20.3(H) 12.1(H)  Hemoglobin 12.0 - 15.0 g/dL 10.6(L) 12.6 10.9(L)  Hematocrit 36.0 - 46.0 % 33.7(L) 39.6 35.0(L)  Platelets 150 - 400 K/uL 171 231 189   CMP Latest Ref Rng & Units 05/29/2019 05/28/2019 05/25/2019  Glucose 70 - 99 mg/dL 118(H) 153(H) 109(H)  BUN 8 - 23 mg/dL 13 14 25(H)  Creatinine 0.44 - 1.00 mg/dL 0.89 0.94 1.18(H)  Sodium 135 - 145 mmol/L 141 136 137  Potassium 3.5 - 5.1 mmol/L 4.2 4.3 4.3  Chloride 98 - 111 mmol/L 102 100 105  CO2 22 - 32 mmol/L 28 24 21(L)  Calcium 8.9 - 10.3 mg/dL 8.4(L) 9.0 8.3(L)  Total Protein 6.5 - 8.1 g/dL - 6.9 -  Total Bilirubin 0.3 - 1.2 mg/dL - 1.1 -  Alkaline Phos 38 - 126 U/L - 76 -  AST 15 - 41 U/L - 21 -  ALT 0 - 44 U/L - 12 -        SIGNED: Deatra James, MD, FACP, FHM. [facility],    If 7PM-7AM, please contact night-coverage [email or web address] 05/29/2019, 10:35 AM

## 2019-05-30 ENCOUNTER — Ambulatory Visit: Payer: Medicare Other | Admitting: Neurology

## 2019-05-30 LAB — CBC
HCT: 30.4 % — ABNORMAL LOW (ref 36.0–46.0)
Hemoglobin: 9.8 g/dL — ABNORMAL LOW (ref 12.0–15.0)
MCH: 29.7 pg (ref 26.0–34.0)
MCHC: 32.2 g/dL (ref 30.0–36.0)
MCV: 92.1 fL (ref 80.0–100.0)
Platelets: 162 10*3/uL (ref 150–400)
RBC: 3.3 MIL/uL — ABNORMAL LOW (ref 3.87–5.11)
RDW: 15.7 % — ABNORMAL HIGH (ref 11.5–15.5)
WBC: 10 10*3/uL (ref 4.0–10.5)
nRBC: 0 % (ref 0.0–0.2)

## 2019-05-30 LAB — BASIC METABOLIC PANEL
Anion gap: 9 (ref 5–15)
BUN: 10 mg/dL (ref 8–23)
CO2: 26 mmol/L (ref 22–32)
Calcium: 8.3 mg/dL — ABNORMAL LOW (ref 8.9–10.3)
Chloride: 105 mmol/L (ref 98–111)
Creatinine, Ser: 0.87 mg/dL (ref 0.44–1.00)
GFR calc Af Amer: >60 mL/min (ref >60)
GFR calc non Af Amer: >60 mL/min (ref >60)
Glucose, Bld: 90 mg/dL (ref 70–99)
Potassium: 3.5 mmol/L (ref 3.5–5.1)
Sodium: 140 mmol/L (ref 135–145)

## 2019-05-30 LAB — BRAIN NATRIURETIC PEPTIDE: B Natriuretic Peptide: 286.1 pg/mL — ABNORMAL HIGH (ref 0.0–100.0)

## 2019-05-30 MED ORDER — METOPROLOL TARTRATE 5 MG/5ML IV SOLN
5.0000 mg | INTRAVENOUS | Status: AC | PRN
  Administered 2019-05-30: 5 mg via INTRAVENOUS
  Filled 2019-05-30: qty 5

## 2019-05-30 NOTE — Plan of Care (Signed)
  Problem: Clinical Measurements: Goal: Ability to maintain clinical measurements within normal limits will improve Outcome: Progressing   

## 2019-05-30 NOTE — Progress Notes (Signed)
Hospitalist progress note   Patient from home, Patient going unclear, Dispo likely to home in 24 hours if  Betty Jackson  Betty Jackson [id number] DOB: 1934-09-18 DOA: 05/28/2019  PCP: Eulas Post, MD   Narrative:  23 white female CKD 3 rheumatoid arthritis chronic steroids, temporal arteritis PMR, atrial fibrillation >4 CHADS2 score on Eliquis CAD status post RCA stent 2006-cath 2014 mild disease Punctate nonhemorrhagic infarct right caudate nucleus 10/2012 L2-3, L3-4 fusion 2015+ discectomy 2017 Prior pericardial effusion status post window 02/2019 Prior diverticulitis on admission 12/2018 Anxiety depression-Reflux Apparently has chronic cough secondary to vocal cord dysfunction- Re-presented to emergency room 05/29/2019 fever 102 white coat 20 Covid test negative lactic acid 2.9 CT abdomen pelvis showed sigmoid diverticulosis mild diverticulitis C. difficile test was positive  Data Reviewed:  White count time 20-->10, hemoglobin 12.6-->9.8, platelet 231-->162 Baseline creatinine is less than 1-on admission 14/0.9-->10/0.8 BNP 286 CXR 2/28 showed mild increased central vascularity  Assessment & Plan: C. difficile colitis sepsis moderate to severe With lactic acidosis on admit Complicated by use of steroids as below Continue vancomycin 125 4 times daily expect 10 to 14 days treatment and monitor trends diarrhea etc. allow regular diet Held pantoprazole 40 daily PMR, rheumatoid arthritis Continue prednisone 10 daily-no signs of decompensation or hypotension so would not stress dose at this time Atrial fibrillation [place] score >4 Continue Cardizem-rate was uncontrolled on 3/1--onc cardizem 180 daily, metoprolol 100 twice daily 1 therefore keep on telemetry short-term Continue Eliquis 5 mg twice daily Compensated HFpEF grade 2 diastolic dysfunction based on echo 04/17/2019 Holding Lasix 40 daily, losartan 100 and monitor trends Does not seem volume overloaded on my exam  today CAD status post stent 2000 6 repeat cath 2014 within normal limits Resume slowly home meds CKD stage III Renal function seems improved-keep fluid status even without replacement or Lasix today monitor trends Prior history of multiple back surgeries as above Osteoporosis If needed continue Norco 5 every 8 as needed pain, Continue alendronate q. 7 days once resolved Normocytic anemia Resume ferrous sulfate 325 twice daily Depression Continue Lexapro 10 daily   Subjective: Awake alert coherent Tells me was rx recently with abx for infectious--note last hospital stay was on vanc and cefepime for undefined sepsis  Is independent at home--had no stool today compared to 1 loose one yesterday--is independant to mobility at home with walker   Consultants:   none  Objective: Vitals:   05/29/19 0250 05/29/19 0916 05/29/19 1810 05/29/19 2200  BP: (!) 98/58 (!) 100/45 114/72 (!) 149/65  Pulse: 78 82 81 91  Resp: 18 18 18 18   Temp: 98.5 F (36.9 C) 98.6 F (37 C)  98.4 F (36.9 C)  TempSrc: Oral Oral    SpO2: 95% 94% 96% 97%  Weight:      Height:        Intake/Output Summary (Last 24 hours) at 05/30/2019 0817 Last data filed at 05/29/2019 1359 Gross per 24 hour  Intake 360 ml  Output 300 ml  Net 60 ml   Filed Weights   05/28/19 1958  Weight: 70 kg    Examination: eomi ncat no focal deficit no rales rhonchi cta b no added sound  s1 s 2no m/r/g abd soft no rebound no guard no le edema Neuro intact no focal deficit  Scheduled Meds: . apixaban  5 mg Oral BID  . vitamin C  1,000 mg Oral Daily  . cholecalciferol  1,000 Units Oral Daily  . diltiazem  180  mg Oral Daily  . escitalopram  10 mg Oral Daily  . ferrous sulfate  325 mg Oral BID WC  . lactobacillus acidophilus  2 tablet Oral TID  . metoprolol tartrate  100 mg Oral BID  . predniSONE  10 mg Oral Q breakfast  . vancomycin  125 mg Oral QID  . vitamin E  400 Units Oral Daily   Continuous Infusions:   LOS: 1  day   Time spent: [place], MD [facility]  05/30/2019, 8:17 AM

## 2019-05-30 NOTE — Progress Notes (Addendum)
@   2315 Paged Cvrg provider M Dennis  Rm 5M10, Betty Jackson, HR 130-140s,other VSS, Schedule PO Lopressor 100mg  adm. Please advise  >> Lopressor 5mg  IV adm    @0333  M Denny Rm 5M10 Betty Jackson, HR creeping up to the 140s, was sleeping,no c/o, VSS. Another Lopressor IV?  >> Lopressor 2.5mg  IV adm   @ 0612 M Denny Rm 5M10C Betty Jackson, HR sustaining in the 130s, got up to 155 2x. Denied s/s was asleep. Please advise  >> Lopressor 2.5 mg IV adm

## 2019-05-31 ENCOUNTER — Encounter (HOSPITAL_COMMUNITY): Payer: Self-pay | Admitting: Internal Medicine

## 2019-05-31 DIAGNOSIS — N1831 Chronic kidney disease, stage 3a: Secondary | ICD-10-CM

## 2019-05-31 DIAGNOSIS — I480 Paroxysmal atrial fibrillation: Secondary | ICD-10-CM

## 2019-05-31 DIAGNOSIS — I4891 Unspecified atrial fibrillation: Secondary | ICD-10-CM

## 2019-05-31 DIAGNOSIS — I2510 Atherosclerotic heart disease of native coronary artery without angina pectoris: Secondary | ICD-10-CM

## 2019-05-31 DIAGNOSIS — A0472 Enterocolitis due to Clostridium difficile, not specified as recurrent: Secondary | ICD-10-CM | POA: Diagnosis present

## 2019-05-31 LAB — COMPREHENSIVE METABOLIC PANEL
ALT: 11 U/L (ref 0–44)
AST: 13 U/L — ABNORMAL LOW (ref 15–41)
Albumin: 2.7 g/dL — ABNORMAL LOW (ref 3.5–5.0)
Alkaline Phosphatase: 60 U/L (ref 38–126)
Anion gap: 10 (ref 5–15)
BUN: 10 mg/dL (ref 8–23)
CO2: 24 mmol/L (ref 22–32)
Calcium: 8.6 mg/dL — ABNORMAL LOW (ref 8.9–10.3)
Chloride: 104 mmol/L (ref 98–111)
Creatinine, Ser: 0.74 mg/dL (ref 0.44–1.00)
GFR calc Af Amer: >60 mL/min (ref >60)
GFR calc non Af Amer: >60 mL/min (ref >60)
Glucose, Bld: 98 mg/dL (ref 70–99)
Potassium: 3.6 mmol/L (ref 3.5–5.1)
Sodium: 138 mmol/L (ref 135–145)
Total Bilirubin: 1.1 mg/dL (ref 0.3–1.2)
Total Protein: 5.5 g/dL — ABNORMAL LOW (ref 6.5–8.1)

## 2019-05-31 LAB — CBC
HCT: 31.3 % — ABNORMAL LOW (ref 36.0–46.0)
Hemoglobin: 9.9 g/dL — ABNORMAL LOW (ref 12.0–15.0)
MCH: 28.9 pg (ref 26.0–34.0)
MCHC: 31.6 g/dL (ref 30.0–36.0)
MCV: 91.5 fL (ref 80.0–100.0)
Platelets: 162 10*3/uL (ref 150–400)
RBC: 3.42 MIL/uL — ABNORMAL LOW (ref 3.87–5.11)
RDW: 15.3 % (ref 11.5–15.5)
WBC: 10.6 10*3/uL — ABNORMAL HIGH (ref 4.0–10.5)
nRBC: 0 % (ref 0.0–0.2)

## 2019-05-31 LAB — MAGNESIUM: Magnesium: 1.3 mg/dL — ABNORMAL LOW (ref 1.7–2.4)

## 2019-05-31 LAB — BRAIN NATRIURETIC PEPTIDE: B Natriuretic Peptide: 506.7 pg/mL — ABNORMAL HIGH (ref 0.0–100.0)

## 2019-05-31 MED ORDER — DIGOXIN 0.25 MG/ML IJ SOLN
0.2500 mg | Freq: Two times a day (BID) | INTRAMUSCULAR | Status: AC
  Administered 2019-05-31 – 2019-06-01 (×2): 0.25 mg via INTRAVENOUS
  Filled 2019-05-31 (×2): qty 1

## 2019-05-31 MED ORDER — METOPROLOL TARTRATE 5 MG/5ML IV SOLN
2.5000 mg | INTRAVENOUS | Status: AC | PRN
  Administered 2019-05-31: 2.5 mg via INTRAVENOUS
  Filled 2019-05-31: qty 5

## 2019-05-31 MED ORDER — DILTIAZEM HCL 25 MG/5ML IV SOLN
10.0000 mg | Freq: Once | INTRAVENOUS | Status: AC
  Administered 2019-05-31: 10 mg via INTRAVENOUS
  Filled 2019-05-31: qty 5

## 2019-05-31 MED ORDER — SODIUM CHLORIDE 0.9 % IV BOLUS
500.0000 mL | Freq: Once | INTRAVENOUS | Status: AC
  Administered 2019-05-31: 500 mL via INTRAVENOUS

## 2019-05-31 MED ORDER — FAMOTIDINE 20 MG PO TABS
20.0000 mg | ORAL_TABLET | Freq: Every day | ORAL | Status: DC
  Administered 2019-05-31 – 2019-06-01 (×2): 20 mg via ORAL
  Filled 2019-05-31 (×2): qty 1

## 2019-05-31 MED ORDER — ZOLPIDEM TARTRATE 5 MG PO TABS: 5.0000 mg | ORAL_TABLET | Freq: Every evening | ORAL | Status: DC | PRN

## 2019-05-31 MED ORDER — METOPROLOL TARTRATE 5 MG/5ML IV SOLN
5.0000 mg | Freq: Once | INTRAVENOUS | Status: AC
  Administered 2019-05-31: 5 mg via INTRAVENOUS
  Filled 2019-05-31: qty 5

## 2019-05-31 MED ORDER — MAGNESIUM SULFATE 4 GM/100ML IV SOLN
4.0000 g | Freq: Once | INTRAVENOUS | Status: AC
  Administered 2019-05-31: 4 g via INTRAVENOUS
  Filled 2019-05-31: qty 100

## 2019-05-31 MED ORDER — POTASSIUM CHLORIDE CRYS ER 20 MEQ PO TBCR
40.0000 meq | EXTENDED_RELEASE_TABLET | Freq: Once | ORAL | Status: AC
  Administered 2019-05-31: 40 meq via ORAL
  Filled 2019-05-31: qty 2

## 2019-05-31 NOTE — Consult Note (Addendum)
Cardiology Consultation:   Patient ID: Betty Jackson MRN: [id number]; DOB: Apr 02, 1934  Admit date: 05/28/2019 Date of Consult: 05/31/2019  Primary Care Provider: Eulas Post, MD Primary Cardiologist: [name], MD   Patient Profile:   Betty Jackson is a 85 y.o. female with a hx of PAF, CAD, HTN, HLD, Chronic diastolic CHF, hx of pericardial effusion s/p window 02/2019, CKD III and temporal arteritis on long term steroids who is being seen today for the evaluation of afib RVR  at the request of Dr. Grandville Silos.   Hx of CAD s/p stenting to RCA in 2005.  Cardiac catheterization in September 2014 showed patent RCA stent, mild disease in the left circumflex artery and LAD. Last stress Myoview in June 2019 was normal.  Prior hx of recurrent afib in setting of influenza and diverticulitis.   Admitted 02/2019 for septic shock/cardiac temponade 2nd to pericardiac effusion s/p sub-xiphoid window by Dr. Darcey Nora. The effusion was culture negative and pathology only showed inflammation. Started on amiodarone to suppress afib for short term.   Admitted 03/2019 for Left ischium fracture. Cardiology seen the patient for elevated troponin in the setting of possible sepsis but blood cultures were negative.. CT of chest concerning for small airway disease versus pneumonitis. Discontinued amiodarone. Echo  with normal LV systolic function, no significant valve disease.   Seen by Dr. Angelena Form for follow up 05/25/19. No plan for ischemic eval.  Off statin due to leg pain and abdominal pain.   History of Present Illness:   Betty Jackson presented 2/28 with abdominal pain, dry  Heaves, diarrhea and fever. She was admitted for sepsis 2nd to C. Difficile colitis. On vancomycin for treatment. discontinued Cefepime. Patient had brief afib episode on 3/1 however she went again in afib RVR overnight.  She has been treated with Cardizem CD 180 mg daily and metoprolol 100 mg twice daily.  Additionally she got  multiple dose of Lopressor IV  and IV Cardizem 10 mg x 1 since midnight however rate remain elevated at 130s. BP soft, most recent reading 106/73.  Cardiology is asked for further management.  Denies chest pain, shortness of breath, dizziness, orthopnea, PND, syncope or melena.  K 3.6. BNP 286>>506. Magnesium 1.3. Hgb 9.9.  WBC 10.6.   Heart Pathway Score:     Past Medical History:  Diagnosis Date  . Abdominal pain, epigastric 06/22/2013  . Abdominal pain, other specified site 12/24/2011  . Abnormal urinalysis 05/20/2017  . Acute respiratory failure with hypoxia ([place]) 05/20/2017  . Anemia, unspecified 10/28/2012  . Anxiety state, unspecified 10/28/2012  . Bruises easily    d/t being on prednisone and plavix  . CAD (coronary artery disease)    a. Stent RCA in [facility];  b. Cath approx 2009 - nonobs per pt report.  . Cataract    immature on the left eye  . Chronic insomnia 02/07/2013  . Chronic lower back pain    scoliosis  . CKD (chronic kidney disease) stage 3, GFR 30-59 ml/min   . Complication of anesthesia    pt has a very high tolerance to meds  . Coronary disease 05/20/2017  . CVA (cerebral infarction) 10/29/2012  . DDD (degenerative disc disease)   . Depression   . Diverticulitis    hx of  . Diverticulosis   . Elevated transaminase level 02/07/2013  . Enteritis   . GERD (gastroesophageal reflux disease) 09/01/2010  . Giant cell arteritis ([place])   . Hemorrhoids   . Herniated nucleus  pulposus, L5-S1, right 11/04/2015  . History of scabies   . HTN (hypertension) 05/20/2017  . Hyperlipidemia    takes Lipitor daily  . Hypertension    takes Amlodipine,Losartan,Metoprolol,and HCTZ daily  . Incontinence of urine   . Influenza A 05/20/2017  . Insomnia    takes Restoril nightly  . Lumbar stenosis 04/25/2013  . Major depressive disorder, recurrent episode, moderate ([place]) 07/16/2013  . Migraines    "back in my 20's; none since" (10/28/2012)  . Osteoporosis   . PAF (paroxysmal atrial  fibrillation) ([place]) 2011   a. lone epidode in 2011 according to notes.  . Rheumatoid arthritis ([place])   . Scoliosis   . Sepsis ([place]) 05/20/2017  . Sepsis ([place]) 05/2019  . Sinus bradycardia    a. on chronic bb  . Temporal arteritis ([place]) 2011   a. followed @ [facility]; potential flareup 10/28/2012/notes 10/28/2012  . Vocal cord dysfunction    "they don't operate properly" (10/28/2012)    Past Surgical History:  Procedure Laterality Date  . ABDOMINAL HYSTERECTOMY  ~ 1984   vaginally  . BACK SURGERY  7-33yrs ago   X Stop  . BLADDER SUSPENSION  2001  . BREAST BIOPSY Right   . CATARACT EXTRACTION W/ INTRAOCULAR LENS IMPLANT Right ~ 08/2012  . CHEST TUBE INSERTION Left 03/08/2019   Procedure: Chest Tube Insertion;  Surgeon: Ivin Poot, MD;  Location: [place];  Service: Thoracic;  Laterality: Left;  . COLONOSCOPY  01/26/2012   Procedure: COLONOSCOPY;  Surgeon: Ladene Artist, MD,FACG;  Location: [facility] ENDOSCOPY;  Service: Endoscopy;  Laterality: N/A;  note the EGD is possible  . CORONARY ANGIOPLASTY WITH STENT PLACEMENT  2006   X 1 stent  . EPIDURAL BLOCK INJECTION    . ESOPHAGOGASTRODUODENOSCOPY  01/26/2012   Procedure: ESOPHAGOGASTRODUODENOSCOPY (EGD);  Surgeon: Ladene Artist, MD,FACG;  Location: [facility] ENDOSCOPY;  Service: Endoscopy;  Laterality: N/A;  . HEMIARTHROPLASTY HIP Right 2012  . IR EPIDUROGRAPHY  04/20/2019  . LAPAROSCOPIC CHOLECYSTECTOMY  2001  . LUMBAR FUSION  03/2013  . LUMBAR LAMINECTOMY/DECOMPRESSION MICRODISCECTOMY Right 11/04/2015   Procedure: Right Lumbar Five-Sacral One Microdiskectomy;  Surgeon: Kristeen Miss, MD;  Location: [place] NEURO ORS;  Service: Neurosurgery;  Laterality: Right;  Right L5-S1 Microdiskectomy  . moles removed that required stiches     one on leg and one on face  . SUBXYPHOID PERICARDIAL WINDOW N/A 03/08/2019   Procedure: SUBXYPHOID PERICARDIAL WINDOW;  Surgeon: Ivin Poot, MD;  Location: [place];  Service: Thoracic;  Laterality: N/A;  . TEE WITHOUT  CARDIOVERSION N/A 03/08/2019   Procedure: TRANSESOPHAGEAL ECHOCARDIOGRAM (TEE);  Surgeon: Prescott Gum, Collier Salina, MD;  Location: [place];  Service: Thoracic;  Laterality: N/A;  . TEMPORAL ARTERY BIOPSY / LIGATION Bilateral 2011  . TONSILLECTOMY AND ADENOIDECTOMY     at age 66  . TUBAL LIGATION  ~ 1982  . X-STOP IMPLANTATION  ~ 2010   "lower back" (10/28/2012)       Inpatient Medications: Scheduled Meds: . apixaban  5 mg Oral BID  . vitamin C  1,000 mg Oral Daily  . cholecalciferol  1,000 Units Oral Daily  . diltiazem  180 mg Oral Daily  . escitalopram  10 mg Oral Daily  . famotidine  20 mg Oral Daily  . ferrous sulfate  325 mg Oral BID WC  . lactobacillus acidophilus  2 tablet Oral TID  . metoprolol tartrate  100 mg Oral BID  . predniSONE  10 mg Oral Q breakfast  . vancomycin  125 mg Oral QID  . vitamin E  400 Units Oral Daily   Continuous Infusions: . magnesium sulfate bolus IVPB 4 g (05/31/19 1322)   PRN Meds: acetaminophen **OR** acetaminophen, HYDROcodone-acetaminophen, traMADol  Allergies:    Allergies  Allergen Reactions  . Dextromethorphan Rash    Social History:   Social History   Socioeconomic History  . Marital status: Married    Spouse name: Not on file  . Number of children: 3  . Years of education: Not on file  . Highest education level: Not on file  Occupational History  . Occupation: Retired Cabin crew  Tobacco Use  . Smoking status: Never Smoker  . Smokeless tobacco: Never Used  Substance and Sexual Activity  . Alcohol use: Yes    Comment: socially  . Drug use: No  . Sexual activity: Not on file  Other Topics Concern  . Not on file  Social History Narrative   Lives in [place] with her husband.  Retired Cabin crew.   Social Determinants of Health   Financial Resource Strain:   . Difficulty of Paying Living Expenses: Not on file  Food Insecurity:   . Worried About Charity fundraiser in the Last Year: Not on file  . Ran Out of Food in the Last  Year: Not on file  Transportation Needs:   . Lack of Transportation (Medical): Not on file  . Lack of Transportation (Non-Medical): Not on file  Physical Activity:   . Days of Exercise per Week: Not on file  . Minutes of Exercise per Session: Not on file  Stress:   . Feeling of Stress : Not on file  Social Connections:   . Frequency of Communication with Friends and Family: Not on file  . Frequency of Social Gatherings with Friends and Family: Not on file  . Attends Religious Services: Not on file  . Active Member of Clubs or Organizations: Not on file  . Attends Archivist Meetings: Not on file  . Marital Status: Not on file  Intimate Partner Violence:   . Fear of Current or Ex-Partner: Not on file  . Emotionally Abused: Not on file  . Physically Abused: Not on file  . Sexually Abused: Not on file    Family History:    Family History  Problem Relation Age of Onset  . Brain cancer Mother 9  . Heart attack Father   . Breast cancer Daughter 65  . Heart disease Other   . Hypertension Other   . Arthritis Neg Hx   . Colon cancer Neg Hx   . Osteoporosis Neg Hx      ROS:  Please see the history of present illness.  All other ROS reviewed and negative.     Physical Exam/Data:   Vitals:   05/31/19 1052 05/31/19 1120 05/31/19 1211 05/31/19 1410  BP: 111/73 106/73    Pulse:  (!) 134  (!) 102  Resp:      Temp:      TempSrc:      SpO2:      Weight:   68.9 kg   Height:        Intake/Output Summary (Last 24 hours) at 05/31/2019 1425 Last data filed at 05/31/2019 1326 Gross per 24 hour  Intake 387.2 ml  Output 0 ml  Net 387.2 ml   Last 3 Weights 05/31/2019 05/28/2019 05/25/2019  Weight (lbs) 152 lb 154 lb 5.2 oz 145 lb 8.1 oz  Weight (kg) 68.947 kg 70 kg 66  kg     Body mass index is 26.09 kg/m.  General:  Well nourished, well developed, in no acute distress HEENT: normal Lymph: no adenopathy Neck: no JVD Endocrine:  No thryomegaly Vascular: No carotid  bruits; FA pulses 2+ bilaterally without bruits  Cardiac:  normal S1, S2; irregularly irregular tachycardic, no murmur  Lungs:  clear to auscultation bilaterally, no wheezing, rhonchi or rales  Abd: soft, diffuse tender to palpation, no hepatomegaly  Ext: Trace bilateral lower lower extremity edema Musculoskeletal:  No deformities, BUE and BLE strength normal and equal Skin: warm and dry  Neuro:  CNs 2-12 intact, no focal abnormalities noted Psych:  Normal affect   EKG:  The EKG was personally reviewed and demonstrates:  Atrial fibrillation at rate of 137 bpm, non specific St/t wave abnormality  Telemetry:  Telemetry was personally reviewed and demonstrates:  Atrial fibrillation at 130s  Relevant CV Studies:  Echo January 2021:  1. Left ventricular ejection fraction, by visual estimation, is 60 to  65%. The left ventricle has normal function. There is mildly increased  left ventricular hypertrophy.  2. Left ventricular diastolic parameters are consistent with Grade II  diastolic dysfunction (pseudonormalization). The mean left atrial pressure  is high.  3. The left ventricle has no regional wall motion abnormalities.  4. Global right ventricle has normal systolic function.The right  ventricular size is normal. No increase in right ventricular wall  thickness.  5. Left atrial size was mildly dilated.  6. Right atrial size was mildly dilated.  7. Mild mitral annular calcification.  8. The mitral valve is normal in structure. Trivial mitral valve  regurgitation.  9. The tricuspid valve is normal in structure.  10. The tricuspid valve is normal in structure. Tricuspid valve  regurgitation is mild-moderate.  11. The aortic valve is normal in structure. Aortic valve regurgitation is  trivial.  12. The pulmonic valve was not well visualized. Pulmonic valve  regurgitation is not visualized.  13. Mildly elevated pulmonary artery systolic pressure.  14. The tricuspid  regurgitant velocity is 2.18 m/s, and with an assumed  right atrial pressure of 15 mmHg, the estimated right ventricular systolic  pressure is mildly elevated at 34.0 mmHg.  15. The inferior vena cava is dilated in size with <50% respiratory  variability, suggesting right atrial pressure of 15 mmHg.  16. There is exaggerated respiratory displacement of the interventricular  septum. This could represent contrictive physiology , but can also be seen  with increased work of breathing (e.g. COPD or asthma exacerbation).  97. Compared to 03/09/2019, left ventricular wall motion and overall  systolic function have improved.   Laboratory Data:  High Sensitivity Troponin:   Recent Labs  Lab 05/28/19 2348  TROPONINIHS 12     Chemistry Recent Labs  Lab 05/29/19 0414 05/30/19 0404 05/31/19 0455  NA 141 140 138  K 4.2 3.5 3.6  CL 102 105 104  CO2 28 26 24   GLUCOSE 118* 90 98  BUN 13 10 10   CREATININE 0.89 0.87 0.74  CALCIUM 8.4* 8.3* 8.6*  GFRNONAA >60 >60 >60  GFRAA >60 >60 >60  ANIONGAP 11 9 10     Recent Labs  Lab 05/28/19 2009 05/31/19 0455  PROT 6.9 5.5*  ALBUMIN 3.6 2.7*  AST 21 13*  ALT 12 11  ALKPHOS 76 60  BILITOT 1.1 1.1   Hematology Recent Labs  Lab 05/29/19 0414 05/30/19 0404 05/31/19 0455  WBC 13.2* 10.0 10.6*  RBC 3.67* 3.30* 3.42*  HGB 10.6*  9.8* 9.9*  HCT 33.7* 30.4* 31.3*  MCV 91.8 92.1 91.5  MCH 28.9 29.7 28.9  MCHC 31.5 32.2 31.6  RDW 15.7* 15.7* 15.3  PLT 171 162 162   BNP Recent Labs  Lab 05/28/19 2009 05/30/19 0404 05/31/19 0455  BNP 260.3* 286.1* 506.7*    DDimer No results for input(s): DDIMER in the last 168 hours.   Radiology/Studies:  CT Abdomen Pelvis W Contrast  Result Date: 05/28/2019 CLINICAL DATA:  85 year old female with abdominal pain, nausea vomiting. EXAM: CT ABDOMEN AND PELVIS WITH CONTRAST TECHNIQUE: Multidetector CT imaging of the abdomen and pelvis was performed using the standard protocol following bolus  administration of intravenous contrast. CONTRAST:  113mL OMNIPAQUE IOHEXOL 300 MG/ML  SOLN COMPARISON:  CT of the abdomen pelvis dated 04/15/2019. FINDINGS: Lower chest: Borderline cardiomegaly. Multi vessel coronary vascular calcification. Partially visualized pericardial effusion measures 9 mm in thickness similar to prior CT. Diffuse interstitial and interlobular septal prominence of the visualized lung bases may represent edema. Partially visualized trace left pleural effusion. No intra-abdominal free air or free fluid. Hepatobiliary: The liver is grossly unremarkable. Mild intrahepatic biliary ductal dilatation as well as mild dilatation of the common bile duct likely post cholecystectomy. Pancreas: Unremarkable. No pancreatic ductal dilatation or surrounding inflammatory changes. Spleen: Normal in size without focal abnormality. Adrenals/Urinary Tract: The adrenal glands are unremarkable. There is no hydronephrosis on either side. There is symmetric enhancement and excretion of contrast by both kidneys. Subcentimeter bilateral renal hypodense lesions are too small to characterize. The visualized ureters appear unremarkable. The urinary bladder is suboptimally evaluated due to streak artifact caused by right hip arthroplasty. Stomach/Bowel: There is sigmoid diverticulosis. Mild perisigmoid stranding may be chronic. Mild acute diverticulitis is not excluded. Clinical correlation is recommended. There is no bowel obstruction. The appendix is normal. Vascular/Lymphatic: Moderate aortoiliac atherosclerotic disease. The IVC is unremarkable. No portal venous gas. There is no adenopathy. Reproductive: Hysterectomy. No adnexal masses. Other: Small fat containing umbilical hernia. Musculoskeletal: Osteopenia with extensive degenerative changes of the spine. L2-L4 posterior fusion, old T11 compression fracture and vertebroplasty as well as L1 vertebroplasty. Total right hip arthroplasty. Similar fragmented appearance of  the left ischial tuberosity, previously suggested to demonstrate a pathologic fracture. Clinical correlation is recommended. No new fracture. IMPRESSION: 1. Sigmoid diverticulosis. Mild perisigmoid stranding, likely chronic. Mild acute diverticulitis is favored less likely but not excluded. Clinical correlation is recommended. No bowel obstruction. Normal appendix. 2. Additional nonacute findings as above and similar to prior CT including Aortic Atherosclerosis (ICD10-I70.0). Electronically Signed   By: Anner Crete M.D.   On: 05/28/2019 23:47   DG Chest Port 1 View  Result Date: 05/28/2019 CLINICAL DATA:  Fevers and weakness EXAM: PORTABLE CHEST 1 VIEW COMPARISON:  05/25/2019 FINDINGS: Cardiac shadow is again mildly enlarged but accentuated by the portable technique. The lungs are well aerated bilaterally. Very mild vascular congestion is noted increased from the prior study. No interstitial edema is noted. No bony abnormality is seen. IMPRESSION: Mild increased central vascular congestion new from the prior exam. Electronically Signed   By: Inez Catalina M.D.   On: 05/28/2019 21:21    Assessment and Plan:   1. Atrial fibrillation with rapid ventricular rate -Prior history of atrial fibrillation in setting of infectious process.  Most recently her amiodarone discontinued (03/2019 admission) secondary to concern for small airway disease versus pneumonitis on CT [place] of the chest.  However plan was short-term per Dr. Lucianne Lei Trigt's note.  -Now she is in recurrent A.  fib RVR in setting of C. difficile colitis.  Rate did not improve despite multiple rounds of IV metoprolol and IV Cardizem x1.  She felt going into atrial fibrillation overnight however asymptomatic since then. -Currently rate in 130s -On metoprolol 100 mg twice daily and Cardizem CD 180 mg daily. -On Eliquis for anticoagulation.  No bleeding issue. -Blood pressure soft to give further rate control agent.  Will review antiarrhythmic options  with MD. -Current episode exacerbated by infection.  2.  Hypomagnesemia -Supplement given  3.  CAD s/p stenting in 2005 to RCA -Patent stent by cath in 2014.  Last stress test was low risk 08/2017. -No angina -Not on aspirin due to need of anticoagulation -Not on statin due to leg pain  4.  Differential colitis -Per primary team  5.  Chronic diastolic heart failure -Most recent echocardiogram April 17, 2019 showed LV function of 60 to 123456, grade 2 diastolic dysfunction -Mild lower extremity edema noted.   BNP 286>>506. Watch volume status with fluid resuscitation. Hold lasix on hold. May need one dose of lasix.  - Renal function stable   For questions or updates, please contact [place] Please consult [email or web address] for contact info under     Jarrett Soho, PA  05/31/2019 2:25 PM   History and all data above reviewed.  Patient examined.  I agree with the findings as above.  We are being asked to see the patient with atrial fib with RVR.  She has had this before as above.  She is on chronic anticoagulation and has paroxysmal atrial fib at home.  She has had diarrhea on and off for several months.  She presented after near syncope.  She has some light headedness on standing.  She was walking in from the deck and had a near syncopal episode.  She was febrile on admission and she has been diagnosed with C diff.  She has had atrial fib with rapid rate over night.  This has been difficult to control because of low BPs.  She is not reporting chest pain.  Trop negative.  BNP has been mildly elevated.      The patient exam reveals [id number]  ,  Lungs: Clear  ,  Abd: Positive bowel sounds, no rebound no guarding, Ext No edema  .  All available labs, radiology testing, previous records reviewed. Agree with documented assessment and plan. Atrial fib:  Rapid rate. Difficult to control and not many options with her Cardizem.  I will give some IV dig tonight.  I will verify that  she has missed no doses of Eliquis in case she needs DCCV.  I would prefer to hold off on this as she has paroxysmal fib and likely, when her acute illness is over if not before, will convert on her own to NSR.     Jeneen Rinks Cissy Galbreath  4:32 PM  05/31/2019

## 2019-05-31 NOTE — Plan of Care (Signed)
  Problem: Education: Goal: Knowledge of General Education information will improve Description Including pain rating scale, medication(s)/side effects and non-pharmacologic comfort measures Outcome: Progressing   

## 2019-05-31 NOTE — Progress Notes (Signed)
Patient has been ST overnight and continues to be as of this time. Vital signs obtained. B/P is 94/ 68 and HR is 143. MD placed orders for EKG. EKG obtained. Patient is asymptomatic. MD notified and stated that he would place orders. Orders obtained and followed. Will continue to monitor.

## 2019-05-31 NOTE — H&P (View-Only) (Signed)
Cardiology Consultation:   Patient ID: Betty Jackson MRN: [id number]; DOB: July 24, 1934  Admit date: 05/28/2019 Date of Consult: 05/31/2019  Primary Care Provider: Eulas Post, MD Primary Cardiologist: Betty Chandler, MD   Patient Profile:   Betty Jackson is a 85 y.o. female with a hx of PAF, CAD, HTN, HLD, Chronic diastolic CHF, hx of pericardial effusion s/p window 02/2019, CKD III and temporal arteritis on long term steroids who is being seen today for the evaluation of afib RVR  at the request of Betty Jackson.   Hx of CAD s/p stenting to RCA in 2005.  Cardiac catheterization in September 2014 showed patent RCA stent, mild disease in the left circumflex artery and LAD. Last stress Myoview in June 2019 was normal.  Prior hx of recurrent afib in setting of influenza and diverticulitis.   Admitted 02/2019 for septic shock/cardiac temponade 2nd to pericardiac effusion s/p sub-xiphoid window by Betty Jackson. The effusion was culture negative and pathology only showed inflammation. Started on amiodarone to suppress afib for short term.   Admitted 03/2019 for Left ischium fracture. Cardiology seen the patient for elevated troponin in the setting of possible sepsis but blood cultures were negative.. CT of chest concerning for small airway disease versus pneumonitis. Discontinued amiodarone. Echo  with normal LV systolic function, no significant valve disease.   Seen by Betty Jackson for follow up 05/25/19. No plan for ischemic eval.  Off statin due to leg pain and abdominal pain.   History of Present Illness:   Betty Jackson presented 2/28 with abdominal pain, dry  Heaves, diarrhea and fever. She was admitted for sepsis 2nd to C. Difficile colitis. On vancomycin for treatment. discontinued Cefepime. Patient had brief afib episode on 3/1 however she went again in afib RVR overnight.  She has been treated with Cardizem CD 180 mg daily and metoprolol 100 mg twice daily.  Additionally she got  multiple dose of Lopressor IV  and IV Cardizem 10 mg x 1 since midnight however rate remain elevated at 130s. BP soft, most recent reading 106/73.  Cardiology is asked for further management.  Denies chest pain, shortness of breath, dizziness, orthopnea, PND, syncope or melena.  K 3.6. BNP 286>>506. Magnesium 1.3. Hgb 9.9.  WBC 10.6.   Heart Pathway Score:     Past Medical History:  Diagnosis Date  . Abdominal pain, epigastric 06/22/2013  . Abdominal pain, other specified site 12/24/2011  . Abnormal urinalysis 05/20/2017  . Acute respiratory failure with hypoxia ([place]) 05/20/2017  . Anemia, unspecified 10/28/2012  . Anxiety state, unspecified 10/28/2012  . Bruises easily    d/t being on prednisone and plavix  . CAD (coronary artery disease)    a. Stent RCA in [facility];  b. Cath approx 2009 - nonobs per pt report.  . Cataract    immature on the left eye  . Chronic insomnia 02/07/2013  . Chronic lower back pain    scoliosis  . CKD (chronic kidney disease) stage 3, GFR 30-59 ml/min   . Complication of anesthesia    pt has a very high tolerance to meds  . Coronary disease 05/20/2017  . CVA (cerebral infarction) 10/29/2012  . DDD (degenerative disc disease)   . Depression   . Diverticulitis    hx of  . Diverticulosis   . Elevated transaminase level 02/07/2013  . Enteritis   . GERD (gastroesophageal reflux disease) 09/01/2010  . Giant cell arteritis ([place])   . Hemorrhoids   . Herniated nucleus  pulposus, L5-S1, right 11/04/2015  . History of scabies   . HTN (hypertension) 05/20/2017  . Hyperlipidemia    takes Lipitor daily  . Hypertension    takes Amlodipine,Losartan,Metoprolol,and HCTZ daily  . Incontinence of urine   . Influenza A 05/20/2017  . Insomnia    takes Restoril nightly  . Lumbar stenosis 04/25/2013  . Major depressive disorder, recurrent episode, moderate ([place]) 07/16/2013  . Migraines    "back in my 20's; none since" (10/28/2012)  . Osteoporosis   . PAF (paroxysmal atrial  fibrillation) ([place]) 2011   a. lone epidode in 2011 according to notes.  . Rheumatoid arthritis ([place])   . Scoliosis   . Sepsis ([place]) 05/20/2017  . Sepsis ([place]) 05/2019  . Sinus bradycardia    a. on chronic bb  . Temporal arteritis ([place]) 2011   a. followed @ [facility]; potential flareup 10/28/2012/notes 10/28/2012  . Vocal cord dysfunction    "they don't operate properly" (10/28/2012)    Past Surgical History:  Procedure Laterality Date  . ABDOMINAL HYSTERECTOMY  ~ 1984   vaginally  . BACK SURGERY  7-36yrs ago   X Stop  . BLADDER SUSPENSION  2001  . BREAST BIOPSY Right   . CATARACT EXTRACTION W/ INTRAOCULAR LENS IMPLANT Right ~ 08/2012  . CHEST TUBE INSERTION Left 03/08/2019   Procedure: Chest Tube Insertion;  Surgeon: Ivin Poot, MD;  Location: [facility];  Service: Thoracic;  Laterality: Left;  . COLONOSCOPY  01/26/2012   Procedure: COLONOSCOPY;  Surgeon: Ladene Artist, MD,FACG;  Location: [facility] ENDOSCOPY;  Service: Endoscopy;  Laterality: N/A;  note the EGD is possible  . CORONARY ANGIOPLASTY WITH STENT PLACEMENT  2006   X 1 stent  . EPIDURAL BLOCK INJECTION    . ESOPHAGOGASTRODUODENOSCOPY  01/26/2012   Procedure: ESOPHAGOGASTRODUODENOSCOPY (EGD);  Surgeon: Ladene Artist, MD,FACG;  Location: [facility] ENDOSCOPY;  Service: Endoscopy;  Laterality: N/A;  . HEMIARTHROPLASTY HIP Right 2012  . IR EPIDUROGRAPHY  04/20/2019  . LAPAROSCOPIC CHOLECYSTECTOMY  2001  . LUMBAR FUSION  03/2013  . LUMBAR LAMINECTOMY/DECOMPRESSION MICRODISCECTOMY Right 11/04/2015   Procedure: Right Lumbar Five-Sacral One Microdiskectomy;  Surgeon: Betty Miss, MD;  Location: [facility] NEURO ORS;  Service: Neurosurgery;  Laterality: Right;  Right L5-S1 Microdiskectomy  . moles removed that required stiches     one on leg and one on face  . SUBXYPHOID PERICARDIAL WINDOW N/A 03/08/2019   Procedure: SUBXYPHOID PERICARDIAL WINDOW;  Surgeon: Ivin Poot, MD;  Location: [facility];  Service: Thoracic;  Laterality: N/A;  . TEE WITHOUT  CARDIOVERSION N/A 03/08/2019   Procedure: TRANSESOPHAGEAL ECHOCARDIOGRAM (TEE);  Surgeon: Betty Jackson, Betty Salina, MD;  Location: [facility];  Service: Thoracic;  Laterality: N/A;  . TEMPORAL ARTERY BIOPSY / LIGATION Bilateral 2011  . TONSILLECTOMY AND ADENOIDECTOMY     at age 37  . TUBAL LIGATION  ~ 1982  . X-STOP IMPLANTATION  ~ 2010   "lower back" (10/28/2012)       Inpatient Medications: Scheduled Meds: . apixaban  5 mg Oral BID  . vitamin C  1,000 mg Oral Daily  . cholecalciferol  1,000 Units Oral Daily  . diltiazem  180 mg Oral Daily  . escitalopram  10 mg Oral Daily  . famotidine  20 mg Oral Daily  . ferrous sulfate  325 mg Oral BID WC  . lactobacillus acidophilus  2 tablet Oral TID  . metoprolol tartrate  100 mg Oral BID  . predniSONE  10 mg Oral Q breakfast  . vancomycin  125 mg Oral QID  . vitamin E  400 Units Oral Daily   Continuous Infusions: . magnesium sulfate bolus IVPB 4 g (05/31/19 1322)   PRN Meds: acetaminophen **OR** acetaminophen, HYDROcodone-acetaminophen, traMADol  Allergies:    Allergies  Allergen Reactions  . Dextromethorphan Rash    Social History:   Social History   Socioeconomic History  . Marital status: Married    Spouse name: Not on file  . Number of children: 3  . Years of education: Not on file  . Highest education level: Not on file  Occupational History  . Occupation: Retired Cabin crew  Tobacco Use  . Smoking status: Never Smoker  . Smokeless tobacco: Never Used  Substance and Sexual Activity  . Alcohol use: Yes    Comment: socially  . Drug use: No  . Sexual activity: Not on file  Other Topics Concern  . Not on file  Social History Narrative   Lives in [place] with her husband.  Retired Cabin crew.   Social Determinants of Health   Financial Resource Strain:   . Difficulty of Paying Living Expenses: Not on file  Food Insecurity:   . Worried About Charity fundraiser in the Last Year: Not on file  . Ran Out of Food in the Last  Year: Not on file  Transportation Needs:   . Lack of Transportation (Medical): Not on file  . Lack of Transportation (Non-Medical): Not on file  Physical Activity:   . Days of Exercise per Week: Not on file  . Minutes of Exercise per Session: Not on file  Stress:   . Feeling of Stress : Not on file  Social Connections:   . Frequency of Communication with Friends and Family: Not on file  . Frequency of Social Gatherings with Friends and Family: Not on file  . Attends Religious Services: Not on file  . Active Member of Clubs or Organizations: Not on file  . Attends Archivist Meetings: Not on file  . Marital Status: Not on file  Intimate Partner Violence:   . Fear of Current or Ex-Partner: Not on file  . Emotionally Abused: Not on file  . Physically Abused: Not on file  . Sexually Abused: Not on file    Family History:    Family History  Problem Relation Age of Onset  . Brain cancer Mother 61  . Heart attack Father   . Breast cancer Daughter 44  . Heart disease Other   . Hypertension Other   . Arthritis Neg Hx   . Colon cancer Neg Hx   . Osteoporosis Neg Hx      ROS:  Please see the history of present illness.  All other ROS reviewed and negative.     Physical Exam/Data:   Vitals:   05/31/19 1052 05/31/19 1120 05/31/19 1211 05/31/19 1410  BP: 111/73 106/73    Pulse:  (!) 134  (!) 102  Resp:      Temp:      TempSrc:      SpO2:      Weight:   68.9 kg   Height:        Intake/Output Summary (Last 24 hours) at 05/31/2019 1425 Last data filed at 05/31/2019 1326 Gross per 24 hour  Intake 387.2 ml  Output 0 ml  Net 387.2 ml   Last 3 Weights 05/31/2019 05/28/2019 05/25/2019  Weight (lbs) 152 lb 154 lb 5.2 oz 145 lb 8.1 oz  Weight (kg) 68.947 kg 70 kg 66  kg     Body mass index is 26.09 kg/m.  General:  Well nourished, well developed, in no acute distress HEENT: normal Lymph: no adenopathy Neck: no JVD Endocrine:  No thryomegaly Vascular: No carotid  bruits; FA pulses 2+ bilaterally without bruits  Cardiac:  normal S1, S2; irregularly irregular tachycardic, no murmur  Lungs:  clear to auscultation bilaterally, no wheezing, rhonchi or rales  Abd: soft, diffuse tender to palpation, no hepatomegaly  Ext: Trace bilateral lower lower extremity edema Musculoskeletal:  No deformities, BUE and BLE strength normal and equal Skin: warm and dry  Neuro:  CNs 2-12 intact, no focal abnormalities noted Psych:  Normal affect   EKG:  The EKG was personally reviewed and demonstrates:  Atrial fibrillation at rate of 137 bpm, non specific St/t wave abnormality  Telemetry:  Telemetry was personally reviewed and demonstrates:  Atrial fibrillation at 130s  Relevant CV Studies:  Echo January 2021:  1. Left ventricular ejection fraction, by visual estimation, is 60 to  65%. The left ventricle has normal function. There is mildly increased  left ventricular hypertrophy.  2. Left ventricular diastolic parameters are consistent with Grade II  diastolic dysfunction (pseudonormalization). The mean left atrial pressure  is high.  3. The left ventricle has no regional wall motion abnormalities.  4. Global right ventricle has normal systolic function.The right  ventricular size is normal. No increase in right ventricular wall  thickness.  5. Left atrial size was mildly dilated.  6. Right atrial size was mildly dilated.  7. Mild mitral annular calcification.  8. The mitral valve is normal in structure. Trivial mitral valve  regurgitation.  9. The tricuspid valve is normal in structure.  10. The tricuspid valve is normal in structure. Tricuspid valve  regurgitation is mild-moderate.  11. The aortic valve is normal in structure. Aortic valve regurgitation is  trivial.  12. The pulmonic valve was not well visualized. Pulmonic valve  regurgitation is not visualized.  13. Mildly elevated pulmonary artery systolic pressure.  14. The tricuspid  regurgitant velocity is 2.18 m/s, and with an assumed  right atrial pressure of 15 mmHg, the estimated right ventricular systolic  pressure is mildly elevated at 34.0 mmHg.  15. The inferior vena cava is dilated in size with <50% respiratory  variability, suggesting right atrial pressure of 15 mmHg.  16. There is exaggerated respiratory displacement of the interventricular  septum. This could represent contrictive physiology , but can also be seen  with increased work of breathing (e.g. COPD or asthma exacerbation).  86. Compared to 03/09/2019, left ventricular wall motion and overall  systolic function have improved.   Laboratory Data:  High Sensitivity Troponin:   Recent Labs  Lab 05/28/19 2348  TROPONINIHS 12     Chemistry Recent Labs  Lab 05/29/19 0414 05/30/19 0404 05/31/19 0455  NA 141 140 138  K 4.2 3.5 3.6  CL 102 105 104  CO2 28 26 24   GLUCOSE 118* 90 98  BUN 13 10 10   CREATININE 0.89 0.87 0.74  CALCIUM 8.4* 8.3* 8.6*  GFRNONAA >60 >60 >60  GFRAA >60 >60 >60  ANIONGAP 11 9 10     Recent Labs  Lab 05/28/19 2009 05/31/19 0455  PROT 6.9 5.5*  ALBUMIN 3.6 2.7*  AST 21 13*  ALT 12 11  ALKPHOS 76 60  BILITOT 1.1 1.1   Hematology Recent Labs  Lab 05/29/19 0414 05/30/19 0404 05/31/19 0455  WBC 13.2* 10.0 10.6*  RBC 3.67* 3.30* 3.42*  HGB 10.6*  9.8* 9.9*  HCT 33.7* 30.4* 31.3*  MCV 91.8 92.1 91.5  MCH 28.9 29.7 28.9  MCHC 31.5 32.2 31.6  RDW 15.7* 15.7* 15.3  PLT 171 162 162   BNP Recent Labs  Lab 05/28/19 2009 05/30/19 0404 05/31/19 0455  BNP 260.3* 286.1* 506.7*    DDimer No results for input(s): DDIMER in the last 168 hours.   Radiology/Studies:  CT Abdomen Pelvis W Contrast  Result Date: 05/28/2019 CLINICAL DATA:  85 year old female with abdominal pain, nausea vomiting. EXAM: CT ABDOMEN AND PELVIS WITH CONTRAST TECHNIQUE: Multidetector CT imaging of the abdomen and pelvis was performed using the standard protocol following bolus  administration of intravenous contrast. CONTRAST:  136mL OMNIPAQUE IOHEXOL 300 MG/ML  SOLN COMPARISON:  CT of the abdomen pelvis dated 04/15/2019. FINDINGS: Lower chest: Borderline cardiomegaly. Multi vessel coronary vascular calcification. Partially visualized pericardial effusion measures 9 mm in thickness similar to prior CT. Diffuse interstitial and interlobular septal prominence of the visualized lung bases may represent edema. Partially visualized trace left pleural effusion. No intra-abdominal free air or free fluid. Hepatobiliary: The liver is grossly unremarkable. Mild intrahepatic biliary ductal dilatation as well as mild dilatation of the common bile duct likely Jackson cholecystectomy. Pancreas: Unremarkable. No pancreatic ductal dilatation or surrounding inflammatory changes. Spleen: Normal in size without focal abnormality. Adrenals/Urinary Tract: The adrenal glands are unremarkable. There is no hydronephrosis on either side. There is symmetric enhancement and excretion of contrast by both kidneys. Subcentimeter bilateral renal hypodense lesions are too small to characterize. The visualized ureters appear unremarkable. The urinary bladder is suboptimally evaluated due to streak artifact caused by right hip arthroplasty. Stomach/Bowel: There is sigmoid diverticulosis. Mild perisigmoid stranding may be chronic. Mild acute diverticulitis is not excluded. Clinical correlation is recommended. There is no bowel obstruction. The appendix is normal. Vascular/Lymphatic: Moderate aortoiliac atherosclerotic disease. The IVC is unremarkable. No portal venous gas. There is no adenopathy. Reproductive: Hysterectomy. No adnexal masses. Other: Small fat containing umbilical hernia. Musculoskeletal: Osteopenia with extensive degenerative changes of the spine. L2-L4 posterior fusion, old T11 compression fracture and vertebroplasty as well as L1 vertebroplasty. Total right hip arthroplasty. Similar fragmented appearance of  the left ischial tuberosity, previously suggested to demonstrate a pathologic fracture. Clinical correlation is recommended. No new fracture. IMPRESSION: 1. Sigmoid diverticulosis. Mild perisigmoid stranding, likely chronic. Mild acute diverticulitis is favored less likely but not excluded. Clinical correlation is recommended. No bowel obstruction. Normal appendix. 2. Additional nonacute findings as above and similar to prior CT including Aortic Atherosclerosis (ICD10-I70.0). Electronically Signed   By: Anner Crete M.D.   On: 05/28/2019 23:47   DG Chest Port 1 View  Result Date: 05/28/2019 CLINICAL DATA:  Fevers and weakness EXAM: PORTABLE CHEST 1 VIEW COMPARISON:  05/25/2019 FINDINGS: Cardiac shadow is again mildly enlarged but accentuated by the portable technique. The lungs are well aerated bilaterally. Very mild vascular congestion is noted increased from the prior study. No interstitial edema is noted. No bony abnormality is seen. IMPRESSION: Mild increased central vascular congestion new from the prior exam. Electronically Signed   By: Inez Catalina M.D.   On: 05/28/2019 21:21    Assessment and Plan:   1. Atrial fibrillation with rapid ventricular rate -Prior history of atrial fibrillation in setting of infectious process.  Most recently her amiodarone discontinued (03/2019 admission) secondary to concern for small airway disease versus pneumonitis on CT Angie of the chest.  However plan was short-term per Dr. Lucianne Lei Trigt's note.  -Now she is in recurrent A.  fib RVR in setting of C. difficile colitis.  Rate did not improve despite multiple rounds of IV metoprolol and IV Cardizem x1.  She felt going into atrial fibrillation overnight however asymptomatic since then. -Currently rate in 130s -On metoprolol 100 mg twice daily and Cardizem CD 180 mg daily. -On Eliquis for anticoagulation.  No bleeding issue. -Blood pressure soft to give further rate control agent.  Will review antiarrhythmic options  with MD. -Current episode exacerbated by infection.  2.  Hypomagnesemia -Supplement given  3.  CAD s/p stenting in 2005 to RCA -Patent stent by cath in 2014.  Last stress test was low risk 08/2017. -No angina -Not on aspirin due to need of anticoagulation -Not on statin due to leg pain  4.  Differential colitis -Per primary team  5.  Chronic diastolic heart failure -Most recent echocardiogram April 17, 2019 showed LV function of 60 to 123456, grade 2 diastolic dysfunction -Mild lower extremity edema noted.   BNP 286>>506. Watch volume status with fluid resuscitation. Hold lasix on hold. May need one dose of lasix.  - Renal function stable   For questions or updates, please contact [place] Please consult [email or web address] for contact info under     Jarrett Soho, PA  05/31/2019 2:25 PM   History and all data above reviewed.  Patient examined.  I agree with the findings as above.  We are being asked to see the patient with atrial fib with RVR.  She has had this before as above.  She is on chronic anticoagulation and has paroxysmal atrial fib at home.  She has had diarrhea on and off for several months.  She presented after near syncope.  She has some light headedness on standing.  She was walking in from the deck and had a near syncopal episode.  She was febrile on admission and she has been diagnosed with C diff.  She has had atrial fib with rapid rate over night.  This has been difficult to control because of low BPs.  She is not reporting chest pain.  Trop negative.  BNP has been mildly elevated.      The patient exam reveals [id number]  ,  Lungs: Clear  ,  Abd: Positive bowel sounds, no rebound no guarding, Ext No edema  .  All available labs, radiology testing, previous records reviewed. Agree with documented assessment and plan. Atrial fib:  Rapid rate. Difficult to control and not many options with her Cardizem.  I will give some IV dig tonight.  I will verify that  she has missed no doses of Eliquis in case she needs DCCV.  I would prefer to hold off on this as she has paroxysmal fib and likely, when her acute illness is over if not before, will convert on her own to NSR.     Jeneen Rinks Trell Secrist  4:32 PM  05/31/2019

## 2019-05-31 NOTE — Progress Notes (Signed)
PROGRESS NOTE    Betty Jackson  QQP:[id number] DOB: 1935/02/27 DOA: 05/28/2019 PCP: Eulas Post, MD    Brief Narrative:  68 white female CKD 3 rheumatoid arthritis chronic steroids, temporal arteritis PMR, atrial fibrillation >4 CHADS2 score on Eliquis CAD status post RCA stent 2006-cath 2014 mild disease Punctate nonhemorrhagic infarct right caudate nucleus 10/2012 L2-3, L3-4 fusion 2015+ discectomy 2017 Prior pericardial effusion status post window 02/2019 Prior diverticulitis on admission 12/2018 Anxiety depression-Reflux Apparently has chronic cough secondary to vocal cord dysfunction- Re-presented to emergency room 05/29/2019 fever 102 white coat 20 Covid test negative lactic acid 2.9 CT abdomen pelvis showed sigmoid diverticulosis mild diverticulitis C. difficile test was positive.  Patient started on oral vancomycin. The evening of 05/30/2019 patient noted to be persistent A. fib with RVR with heart rate sustaining with heart rates greater than the 130s.   Assessment & Plan:   Principal Problem:   Sepsis ([place]) Active Problems:   History of atrial fibrillation   CKD (chronic kidney disease), stage III   Diarrhea   CHF (congestive heart failure) ([place])  1 sepsis secondary to C. difficile colitis Patient with some clinical improvement.  Number of stools decreasing however still with loose stools.  PPI discontinued.  Continue oral vancomycin to complete a 10 to 14-day course of treatment.  Supportive care.  2.  A. fib with RVR CHA2DS2VASC > 4 Patient noted to be in A. fib with RVR with heart rate sustaining > 130s since last night.  Patient asymptomatic.  Patient received home dose oral Cardizem and Lopressor however heart rate still greater than 130.  Lopressor 5 mg IV x1 ordered.  Will order Cardizem 10 mg IV push.  Patient with borderline blood pressure with sustaining heart rates and as such we will consult with cardiology for further evaluation and management.  Continue  Eliquis for anticoagulation.  Follow.  3.  Chronic grade 2 diastolic CHF Stable.  ARB and diuretics on hold.  Patient looks somewhat clinically dry and not volume overloaded.  Patient with borderline blood pressure.  We will give a fluid bolus.  Follow.  4.  PMR/rheumatoid arthritis Stable.  Continue home dose prednisone.  Outpatient follow-up.  5.  Chronic kidney disease stage IIIa Stable.  Follow.  Diuretics on hold.  6.  Hypomagnesemia Magnesium sulfate 4 g IV x1.  Repeat labs in the morning.  7.  Coronary artery disease status post stent Currently stable.  Continue home regimen Cardizem and Lopressor.  Diuretics on hold.  ARB on hold.  Follow.  8.  History of multiple back surgeries  9.  Osteoporosis Continue current pain regimen.  10.  Normocytic anemia Stable.  Continue oral iron supplementation.  11.  Depression Stable.  Continue Lexapro.    DVT prophylaxis: Eliquis Code Status: DNR Family Communication: Updated patient.  No family at bedside. Disposition Plan:  . Patient came from: Home            . Anticipated d/c place: Home . Barriers to d/c OR conditions which need to be met to effect a safe d/c: Likely home when clinically improved, tachycardia improved, improvement with C. difficile colitis, cleared by cardiology.   Consultants:   Cardiology pending  Procedures:   CT abdomen and pelvis 05/28/2019  Chest x-ray 05/28/2019  Antimicrobials:   IV cefepime 05/28/2019>>>>> 05/29/2019  IV Flagyl 05/28/2019>>>> 05/29/2019  IV vancomycin 05/28/2019 >>>>> 05/29/2019   Oral vancomycin 05/29/2019>>>> 06/08/2019    Subjective: Heart rate noted to be sustaining greater than  the 130s overnight and throughout the morning.  Patient denies any chest pain or shortness of breath.  No palpitations.  Patient with borderline blood pressure.  Objective: Vitals:   05/31/19 0844 05/31/19 1052 05/31/19 1120 05/31/19 1211  BP: 94/68 111/73 106/73   Pulse: (!) 143  (!) 134     Resp: 20     Temp: 99.1 F (37.3 C)     TempSrc: Oral     SpO2: 97%     Weight:    68.9 kg  Height:        Intake/Output Summary (Last 24 hours) at 05/31/2019 1230 Last data filed at 05/31/2019 0845 Gross per 24 hour  Intake 720 ml  Output 0 ml  Net 720 ml   Filed Weights   05/28/19 1958 05/31/19 1211  Weight: 70 kg 68.9 kg    Examination:  General exam: Appears calm and comfortable  Respiratory system: Clear to auscultation. Respiratory effort normal. Cardiovascular system: Irregularly irregular. No JVD, murmurs, rubs, gallops or clicks. No pedal edema. Gastrointestinal system: Abdomen is nondistended, soft and nontender. No organomegaly or masses felt. Normal bowel sounds heard. Central nervous system: Alert and oriented. No focal neurological deficits. Extremities: Symmetric 5 x 5 power. Skin: No rashes, lesions or ulcers Psychiatry: Judgement and insight appear normal. Mood & affect appropriate.     Data Reviewed: I have personally reviewed following labs and imaging studies  CBC: Recent Labs  Lab 05/25/19 1907 05/28/19 2009 05/29/19 0414 05/30/19 0404 05/31/19 0455  WBC 12.1* 20.3* 13.2* 10.0 10.6*  NEUTROABS  --  17.2*  --   --   --   HGB 10.9* 12.6 10.6* 9.8* 9.9*  HCT 35.0* 39.6 33.7* 30.4* 31.3*  MCV 93.6 92.5 91.8 92.1 91.5  PLT 189 231 171 162 703   Basic Metabolic Panel: Recent Labs  Lab 05/25/19 1907 05/28/19 2009 05/29/19 0414 05/30/19 0404 05/31/19 0455  NA 137 136 141 140 138  K 4.3 4.3 4.2 3.5 3.6  CL 105 100 102 105 104  CO2 21* _0 GLUCOSE 109* 153* 118* 90 98  BUN 25* _1 CREATININE 1.18* 0.94 0.89 0.87 0.74  CALCIUM 8.3* 9.0 8.4* 8.3* 8.6*  MG  --   --   --   --  1.3*   GFR: Estimated Creatinine Clearance: 51.7 mL/min (by C-G formula based on SCr of 0.74 mg/dL). Liver Function Tests: Recent Labs  Lab 05/28/19 2009 05/31/19 0455  AST 21 13*  ALT 12 11  ALKPHOS 76 60  BILITOT 1.1 1.1  PROT 6.9 5.5*   ALBUMIN 3.6 2.7*   Recent Labs  Lab 05/28/19 2348  LIPASE 30   No results for input(s): AMMONIA in the last 168 hours. Coagulation Profile: Recent Labs  Lab 05/28/19 2058  INR 1.4*   Cardiac Enzymes: No results for input(s): CKTOTAL, CKMB, CKMBINDEX, TROPONINI in the last 168 hours. BNP (last 3 results) No results for input(s): PROBNP in the last 8760 hours. HbA1C: No results for input(s): HGBA1C in the last 72 hours. CBG: Recent Labs  Lab 05/25/19 2053  GLUCAP 103*   Lipid Profile: No results for input(s): CHOL, HDL, LDLCALC, TRIG, CHOLHDL, LDLDIRECT in the last 72 hours. Thyroid Function Tests: No results for input(s): TSH, T4TOTAL, FREET4, T3FREE, THYROIDAB in the last 72 hours. Anemia Panel: No results for input(s): VITAMINB12, FOLATE, FERRITIN, TIBC, IRON, RETICCTPCT in the last 72 hours. Sepsis Labs: Recent Labs  Lab 05/28/19 2034 05/28/19 2348  05/29/19 0839 05/29/19 1036  LATICACIDVEN 2.4* 1.6 2.9* 1.7    Recent Results (from the past 240 hour(s))  Culture, blood (routine x 2)     Status: None (Preliminary result)   Collection Time: 05/28/19  8:34 PM   Specimen: BLOOD  Result Value Ref Range Status   Specimen Description BLOOD LEFT ANTECUBITAL  Final   Special Requests   Final    BOTTLES DRAWN AEROBIC AND ANAEROBIC Blood Culture adequate volume   Culture   Final    NO GROWTH 3 DAYS Performed at [facility], [place] [address], [place], [place] [address]    Report Status PENDING  Incomplete  Culture, blood (routine x 2)     Status: None (Preliminary result)   Collection Time: 05/28/19  9:04 PM   Specimen: BLOOD RIGHT FOREARM  Result Value Ref Range Status   Specimen Description BLOOD RIGHT FOREARM  Final   Special Requests   Final    BOTTLES DRAWN AEROBIC AND ANAEROBIC Blood Culture results may not be optimal due to an inadequate volume of blood received in culture bottles   Culture   Final    NO GROWTH 3 DAYS Performed at [facility], [place] [address] [place], [place] [address]    Report Status PENDING  Incomplete  SARS CORONAVIRUS 2 (TAT 6-24 HRS) Nasopharyngeal Nasopharyngeal Swab     Status: None   Collection Time: 05/28/19 11:49 PM   Specimen: Nasopharyngeal Swab  Result Value Ref Range Status   SARS Coronavirus 2 NEGATIVE NEGATIVE Final    Comment: (NOTE) SARS-CoV-2 target nucleic acids are NOT DETECTED. The SARS-CoV-2 RNA is generally detectable in upper and lower respiratory specimens during the acute phase of infection. Negative results do not preclude SARS-CoV-2 infection, do not rule out co-infections with other pathogens, and should not be used as the sole basis for treatment or other patient management decisions. Negative results must be combined with clinical observations, patient history, and epidemiological information. The expected result is Negative. Fact Sheet for Patients: SugarRoll.be Fact Sheet for Healthcare Providers: https://www.woods-mathews.com/ This test is not yet approved or cleared by the [place] FDA and  has been authorized for detection and/or diagnosis of SARS-CoV-2 by FDA under an Emergency Use Authorization (EUA). This EUA will remain  in effect (meaning this test can be used) for the duration of the COVID-19 declaration under Section 56 4(b)(1) of the Act, 21 U.S.C. section 360bbb-3(b)(1), unless the authorization is terminated or revoked sooner. Performed at [facility], [place] [address], [place], [place] [address]   Urine culture     Status: None   Collection Time: 05/29/19  1:47 AM   Specimen: Urine, Random  Result Value Ref Range Status   Specimen Description URINE, RANDOM  Final   Special Requests NONE  Final   Culture   Final    NO GROWTH Performed at [facility], [place] [address] [place], [place] [address]    Report Status 05/29/2019 FINAL  Final  C difficile quick scan w PCR reflex     Status: Abnormal    Collection Time: 05/29/19  8:31 AM   Specimen: STOOL  Result Value Ref Range Status   C Diff antigen POSITIVE (A) NEGATIVE Final   C Diff toxin NEGATIVE NEGATIVE Final   C Diff interpretation Results are indeterminate. See PCR results.  Final    Comment: Performed at [facility], [place] [address] [place],  [address]  C.  Diff by PCR, Reflexed     Status: Abnormal   Collection Time: 05/29/19  8:31 AM  Result Value Ref Range Status   Toxigenic C. Difficile by PCR POSITIVE (A) NEGATIVE Final    Comment: Positive for toxigenic C. difficile with little to no toxin production. Only treat if clinical presentation suggests symptomatic illness. Performed at [facility], [place] [address], [place], [place] [address]          Radiology Studies: No results found.      Scheduled Meds: . apixaban  5 mg Oral BID  . vitamin C  1,000 mg Oral Daily  . cholecalciferol  1,000 Units Oral Daily  . diltiazem  180 mg Oral Daily  . diltiazem  10 mg Intravenous Once  . escitalopram  10 mg Oral Daily  . famotidine  20 mg Oral Daily  . ferrous sulfate  325 mg Oral BID WC  . lactobacillus acidophilus  2 tablet Oral TID  . metoprolol tartrate  100 mg Oral BID  . predniSONE  10 mg Oral Q breakfast  . vancomycin  125 mg Oral QID  . vitamin E  400 Units Oral Daily   Continuous Infusions: . magnesium sulfate bolus IVPB       LOS: 2 days    Time spent: 35 minutes    Irine Seal, MD [facility]   To contact the attending provider between 7A-7P or the covering provider during after hours 7P-7A, please log into the web site [email or web address] and access using universal [place] password for that web site. If you do not have the password, please call the hospital operator.  05/31/2019, 12:30 PM

## 2019-05-31 NOTE — Discharge Instructions (Signed)

## 2019-06-01 LAB — GI PATHOGEN PANEL BY PCR, STOOL

## 2019-06-01 LAB — BASIC METABOLIC PANEL WITH GFR
Anion gap: 10 (ref 5–15)
BUN: 11 mg/dL (ref 8–23)
CO2: 23 mmol/L (ref 22–32)
Calcium: 8.5 mg/dL — ABNORMAL LOW (ref 8.9–10.3)
Chloride: 107 mmol/L (ref 98–111)
Creatinine, Ser: 0.99 mg/dL (ref 0.44–1.00)
GFR calc Af Amer: >60 mL/min
GFR calc non Af Amer: 53 mL/min — ABNORMAL LOW
Glucose, Bld: 100 mg/dL — ABNORMAL HIGH (ref 70–99)
Potassium: 4.8 mmol/L (ref 3.5–5.1)
Sodium: 140 mmol/L (ref 135–145)

## 2019-06-01 LAB — CBC
HCT: 30 % — ABNORMAL LOW (ref 36.0–46.0)
Hemoglobin: 9.4 g/dL — ABNORMAL LOW (ref 12.0–15.0)
MCH: 28.7 pg (ref 26.0–34.0)
MCHC: 31.3 g/dL (ref 30.0–36.0)
MCV: 91.7 fL (ref 80.0–100.0)
Platelets: 193 10*3/uL (ref 150–400)
RBC: 3.27 MIL/uL — ABNORMAL LOW (ref 3.87–5.11)
RDW: 15.4 % (ref 11.5–15.5)
WBC: 9.9 10*3/uL (ref 4.0–10.5)
nRBC: 0 % (ref 0.0–0.2)

## 2019-06-01 LAB — BRAIN NATRIURETIC PEPTIDE: B Natriuretic Peptide: 507.5 pg/mL — ABNORMAL HIGH (ref 0.0–100.0)

## 2019-06-01 LAB — MAGNESIUM: Magnesium: 2.5 mg/dL — ABNORMAL HIGH (ref 1.7–2.4)

## 2019-06-01 SURGERY — CARDIOVERSION
Anesthesia: General

## 2019-06-01 MED ORDER — FAMOTIDINE 20 MG PO TABS: 20.0000 mg | ORAL_TABLET | Freq: Every day | ORAL | 1 refills | Status: DC

## 2019-06-01 MED ORDER — VANCOMYCIN HCL 125 MG PO CAPS: 125.0000 mg | ORAL_CAPSULE | Freq: Four times a day (QID) | ORAL | 0 refills | Status: AC

## 2019-06-01 MED ORDER — FUROSEMIDE 40 MG PO TABS: 40.0000 mg | ORAL_TABLET | Freq: Every day | ORAL | Status: DC

## 2019-06-01 MED ORDER — DILTIAZEM HCL ER COATED BEADS 240 MG PO CP24: 240.0000 mg | ORAL_CAPSULE | Freq: Every day | ORAL | 1 refills | Status: DC

## 2019-06-01 MED ORDER — LOSARTAN POTASSIUM 100 MG PO TABS: 100.0000 mg | ORAL_TABLET | Freq: Every day | ORAL | Status: DC

## 2019-06-01 MED ORDER — DILTIAZEM HCL ER COATED BEADS 120 MG PO CP24: 240.0000 mg | ORAL_CAPSULE | Freq: Every day | ORAL | Status: DC

## 2019-06-01 MED ORDER — BACID PO TABS: 2.0000 | ORAL_TABLET | Freq: Three times a day (TID) | ORAL | 0 refills | Status: DC

## 2019-06-01 MED FILL — CARTIA XT 240 MG CAPSULE: 240 | 30 days supply | Qty: 30 | Fill #0

## 2019-06-01 MED FILL — FLORASTOR 250 MG CAPSULE: 250 | 10 days supply | Qty: 20 | Fill #0

## 2019-06-01 MED FILL — VANCOMYCIN HCL 125 MG CAP: 125 | 7 days supply | Qty: 28 | Fill #0

## 2019-06-01 MED FILL — FAMOTIDINE 20 MG TABS: 20 | 30 days supply | Qty: 30 | Fill #0

## 2019-06-01 NOTE — Interval H&P Note (Signed)
History and Physical Interval Note:  06/01/2019 9:18 AM  Betty Jackson  has presented today for surgery, with the diagnosis of A-FIB.  The various methods of treatment have been discussed with the patient and family. After consideration of risks, benefits and other options for treatment, the patient has consented to  Procedure(s) with comments: CARDIOVERSION (N/A) - DO NOT TAKE PATIENT UNTIL DOCTOR ROUNDS-TRISH as a surgical intervention.  The patient's history has been reviewed, patient examined, no change in status, stable for surgery.  I have reviewed the patient's chart and labs.  Questions were answered to the patient's satisfaction.     [name]

## 2019-06-01 NOTE — TOC Initial Note (Signed)
Transition of Care [facility]) - Initial/Assessment Note    Patient Details  Name: Betty Jackson MRN: [id number] Date of Birth: 11/12/1934  Transition of Care [facility]) CM/SW Contact:    Bethena Roys, RN Phone Number: 06/01/2019, 2:21 PM  Clinical Narrative:  Patient presented for fever. Prior to arrival patient was from home with spouse and sister. Patient has support of her daughters. Plan will be to return home with home health services. Case Manager had a very detailed conversation with both daughters regarding home health services and disciplines. At this time, daughters want to keep the patient in the home and not send to a facility at this time. Family wants to use [facility] for services- Referral made to the Liaison and start of care to begin on Sunday for services. Office is aware to call daughter [name] with start date and time of services. The husband receives services via the homemaker aide program with the [place] and daughters were trying to explore other services to support the mother as well. Daughters are looking into personal care services [facility]) for the mother this evening. Case Manager spoke with [facility])  Liaison to ask her to follow the patient post hospitalization for community resources. Patient has durable medical equipment (DME) rolling walker, bedside commode, cane and transport wheelchair in the home. No additional DME recommended- daughters will provide transportation home via private vehicle this evening around 5:30. No further needs from Case Manager at this time.              Expected Discharge Plan: [place] Barriers to Discharge: No Barriers Identified   Patient Goals and CMS Choice Patient states their goals for this hospitalization and ongoing recovery are:: "to return home"   Choice offered to / list presented to : NA  Expected Discharge Plan and Services Expected Discharge Plan: [place] In-house Referral: [facility] Discharge Planning Services: CM Consult Post Acute Care Choice: [place] arrangements for the past 2 months: Single Family Home(Condo) Expected Discharge Date: 06/01/19                 DME Agency: NA       HH Arranged: RN, Disease Management, PT, OT, Nurse's Aide [place] Agency: [facility] ([facility]) Date HH Agency Contacted: 06/01/19 Time HH Agency Contacted: 72 Representative spoke with at [place]: [place]  Prior Living Arrangements/Services Living arrangements for the past 2 months: Single Family Home(Condo) Lives with:: Spouse, Siblings(Patient's sister lives in the home.) Patient language and need for interpreter reviewed:: Yes Do you feel safe going back to the place where you live?: Yes      Need for Family Participation in Patient Care: Yes (Comment)   Current home services: DME(Patient has DME RW, BSC, Cane, Archivist.) Criminal Activity/Legal Involvement Pertinent to Current Situation/Hospitalization: No - Comment as needed  Activities of Daily Living Home Assistive Devices/Equipment: Environmental consultant (specify type) ADL Screening (condition at time of admission) Patient's cognitive ability adequate to safely complete daily activities?: Yes Is the patient deaf or have difficulty hearing?: Yes Does the patient have difficulty seeing, even when wearing glasses/contacts?: No Does the patient have difficulty concentrating, remembering, or making decisions?: No Patient able to express need for assistance with ADLs?: Yes Does the patient have difficulty dressing or bathing?: No Independently performs ADLs?: Yes (appropriate for developmental age) Does the patient have difficulty walking or climbing stairs?: Yes Weakness of Legs: Both Weakness of Arms/Hands:  None  Permission Sought/Granted Permission sought to share information with : Family Supports, Chartered certified accountant granted to share  information with : Yes, Verbal Permission Granted  Share Information with NAME: [name]  Permission granted to share info w AGENCY: [facility]        Emotional Assessment Appearance:: Appears stated age Attitude/Demeanor/Rapport: Unable to Assess Affect (typically observed): Unable to Assess Orientation: : Oriented to Self, Oriented to Place, Oriented to  Time, Oriented to Situation Alcohol / Substance Use: Not Applicable Psych Involvement: No (comment)  Admission diagnosis:  Sepsis ([place]) [A41.9] Fever, unspecified fever cause [R50.9] Patient Active Problem List   Diagnosis Date Noted  . Paroxysmal A-fib ([place])   . Hypomagnesemia   . C. difficile colitis   . Diarrhea 05/29/2019  . CHF (congestive heart failure) ([place]) 05/29/2019  . Recurrent falls 04/30/2019  . Pericardial effusion without cardiac tamponade 03/29/2019  . Hypoalbuminemia 03/27/2019  . Cardiac tamponade   . Pericarditis 03/06/2019  . Fever   . Septic shock ([place])   . Atypical chest pain   . Atrial fibrillation with rapid ventricular response ([place])   . Nausea 01/17/2019  . Acute diverticulitis 01/13/2019  . AKI (acute kidney injury) ([place]) 01/13/2019  . Chronic pain 01/13/2019  . Tachycardia 01/13/2019  . Hypokalemia 01/13/2019  . Chronic low back pain 07/10/2018  . Influenza A 05/20/2017  . Sepsis ([place]) 05/20/2017  . Acute respiratory failure with hypoxia ([place]) 05/20/2017  . CKD (chronic kidney disease), stage III 05/20/2017  . PAF (paroxysmal atrial fibrillation) ([place]) 05/20/2017  . HTN (hypertension) 05/20/2017  . Giant cell arteritis ([place]) 05/20/2017  . Coronary disease 05/20/2017  . Abnormal urinalysis 05/20/2017  . Osteoporosis 05/08/2016  . Herniated nucleus pulposus, L5-S1, right 11/04/2015  . Major depressive disorder, recurrent episode, moderate ([place]) 07/16/2013  . Abdominal pain, epigastric 06/22/2013  . Lumbar stenosis 04/25/2013  . Chronic insomnia  02/07/2013  . Elevated transaminase level 02/07/2013  . CVA (cerebral infarction) 10/29/2012  . Visual changes 10/28/2012  . Depression 10/28/2012  . Anxiety state, unspecified 10/28/2012  . Anemia, unspecified 10/28/2012  . Nonspecific (abnormal) findings on radiological and other examination of gastrointestinal tract 01/25/2012  . Hyponatremia 01/24/2012  . Hematuria 01/24/2012  . Enteritis 12/24/2011  . Abdominal pain, other specified site 12/24/2011  . Leg pain, right 02/18/2011  . Temporal arteritis ([place]) 09/01/2010  . Hypertension 09/01/2010  . Hyperlipidemia 09/01/2010  . History of atrial fibrillation 09/01/2010  . CAD (coronary artery disease) 09/01/2010  . GERD (gastroesophageal reflux disease) 09/01/2010  . Insomnia 09/01/2010   PCP:  Eulas Post, MD Pharmacy:   [id number] [place], [place] [address] Phone: [phone] Fax: [place], [place] - [address] [place] [address] Phone: [phone] Fax: [phone]      Readmission Risk Interventions Readmission Risk Prevention Plan 06/01/2019 04/21/2019 03/08/2019  Transportation Screening Complete Complete -  Medication Review ([place]) Complete Complete Complete  PCP or Specialist appointment within 3-5 days of discharge Complete Complete -  [place] or Home Care Consult Complete Complete -  SW Recovery Care/Counseling Consult Complete Complete -  Palliative Care Screening - Not Applicable -  [place] Not Applicable Not Applicable -  Some recent data might be hidden

## 2019-06-01 NOTE — Evaluation (Signed)
Physical Therapy Evaluation Patient Details Name: Betty Jackson MRN: [id number] DOB: [birth year] Today's Date: 06/01/2019   History of Present Illness  Pt is an 85 y/o female admitted secondary to sepsis likely from C-diff colitis. Pt also with a fib and to have cardioversion per notes. PMH includes a fib, CKD, CHF, HTN, CAD, and RA.   Clinical Impression  Pt admitted secondary to problem above with deficits below. Pt requiring min A to stand and transfer to chair using RW. Pt reports requiring assist to transfer at baseline and reports she mainly uses a WC for mobility. Reports her daughter, sister, and aide can assist at d/c. Feel pt would benefit from HHPT to address mobility deficits. Will continue to follow acutely to maximize functional mobility independence and safety.     Follow Up Recommendations Home health PT;Supervision/Assistance - 24 hour    Equipment Recommendations  None recommended by PT    Recommendations for Other Services       Precautions / Restrictions Precautions Precautions: Fall Restrictions Weight Bearing Restrictions: No      Mobility  Bed Mobility Overal bed mobility: Needs Assistance Bed Mobility: Supine to Sit     Supine to sit: Min assist     General bed mobility comments: Min A for trunk elevation.   Transfers Overall transfer level: Needs assistance Equipment used: Rolling walker (2 wheeled) Transfers: Sit to/from [facility] Sit to Stand: Min assist Stand pivot transfers: Min assist       General transfer comment: Min A for lift assist and steadying to stand. Cues for safe hand placement. Min A for steadying assist to transfer to chair.   Ambulation/Gait                Stairs            Wheelchair Mobility    Modified Rankin (Stroke Patients Only)       Balance Overall balance assessment: Needs assistance Sitting-balance support: No upper extremity supported;Feet supported Sitting balance-Leahy  Scale: Fair     Standing balance support: Bilateral upper extremity supported;During functional activity Standing balance-[name]: Poor Standing balance comment: Reliant on BUE and external support                             Pertinent Vitals/Pain Pain Assessment: No/denies pain    Home Living Family/patient expects to be discharged to:: Private residence Living Arrangements: Spouse/significant other;Other relatives Available Help at Discharge: Family;Personal care attendant;Available 24 hours/day Type of Home: House Home Access: Ramped entrance     Home Layout: One level Home Equipment: Walker - 4 wheels;Bedside commode;Shower seat;Grab bars - tub/shower;Wheelchair - manual Additional Comments: Has an aide that comes 2 days/week to assist with bathing.     Prior Function Level of Independence: Needs assistance   Gait / Transfers Assistance Needed: Uses WC for mobility. Requires assist to transfer.   ADL's / Homemaking Assistance Needed: Requires assist for ADL tasks.         Hand Dominance   Dominant Hand: Right    Extremity/Trunk Assessment   Upper Extremity Assessment Upper Extremity Assessment: Defer to OT evaluation    Lower Extremity Assessment Lower Extremity Assessment: Generalized weakness    Cervical / Trunk Assessment Cervical / Trunk Assessment: Kyphotic  Communication   Communication: HOH  Cognition Arousal/Alertness: Awake/alert Behavior During Therapy: WFL for tasks assessed/performed Overall Cognitive Status: Within Functional Limits for tasks assessed  General Comments      Exercises     Assessment/Plan    PT Assessment Patient needs continued PT services  PT Problem List Decreased strength;Decreased balance;Decreased mobility;Decreased knowledge of use of DME;Decreased activity tolerance       PT Treatment Interventions Gait training;Functional mobility  training;Therapeutic activities;Therapeutic exercise;DME instruction;Balance training;Neuromuscular re-education;Wheelchair mobility training    PT Goals (Current goals can be found in the Care Plan section)  Acute Rehab PT Goals Patient Stated Goal: to go home PT Goal Formulation: With patient Time For Goal Achievement: 06/15/19 Potential to Achieve Goals: Good    Frequency Min 3X/week   Barriers to discharge        Co-evaluation               AM-PAC PT "6 Clicks" Mobility  Outcome Measure Help needed turning from your back to your side while in a flat bed without using bedrails?: A Little Help needed moving from lying on your back to sitting on the side of a flat bed without using bedrails?: A Little Help needed moving to and from a bed to a chair (including a wheelchair)?: A Little Help needed standing up from a chair using your arms (e.g., wheelchair or bedside chair)?: A Little Help needed to walk in hospital room?: A Little Help needed climbing 3-5 steps with a railing? : A Lot 6 Click Score: 17    End of Session   Activity Tolerance: Patient tolerated treatment well Patient left: in chair;with call bell/phone within reach Nurse Communication: Mobility status PT Visit Diagnosis: Unsteadiness on feet (R26.81);Muscle weakness (generalized) (M62.81)    Time: [id number] PT Time Calculation (min) (ACUTE ONLY): 16 min   Charges:   PT Evaluation $PT Eval Moderate Complexity: 1 Mod          Reuel Derby, PT, DPT  Acute Rehabilitation Services  Pager: [phone] Office: [phone]   Rudean Hitt 06/01/2019, 1:38 PM

## 2019-06-01 NOTE — Consult Note (Signed)
   [facility] CM Inpatient Consult   06/01/2019  Betty Jackson 1935/02/24 [id number]  Referral received from inpatient [facility] regarding patient needs for community follow up. [name] states that daughter would like to be called for post hospital care management as the patient is unable to understand on the phone regarding care needs and program. Patient had been outreached in the past. Patient was assessed for [facility] for community services. Patient was previously active with [facility].   Chart review from MD HPI includes but not limited to:  Betty Jackson a 85 y.o.femalewith medical history significant ofCKD stage III, CAD status post PCI, chronic atrial fibrillation, diastolic congestive heart failure, CVA, hypertension, hyperlipidemia, giant cell arteritis, RA on chronic steroids, chronic back pain status post L2-3 and L3-4 posterior fusion in 2015 and an L5-S1 microdiscectomy in 2017 presenting to the ED with complaints of fever and not feeling well.Patient states she had a fever of 102.5 F today. States she received her second Covid vaccine over 3 weeks ago. Reports having a chronic cough secondary to vocal cord dysfunction.  Plan assign patient to [place] Coordinator for care and disease management.  Of note, [facility] Care Management services does not replace or interfere with any services that are arranged by inpatient [facility] care management team.   For additional questions or referrals please contact:  [name], RN BSN [facility] Liaison  [phone] business mobile phone Toll free office [phone]  Fax number: [phone] [name]

## 2019-06-01 NOTE — Discharge Summary (Signed)
Physician Discharge Summary  Betty Jackson [id number] DOB: 1934-08-11 DOA: 05/28/2019  PCP: Eulas Post, MD  Admit date: 05/28/2019 Discharge date: 06/01/2019  Time spent: 55 minutes  Recommendations for Outpatient Follow-up:  1. Follow-up with Eulas Post, MD in 2 weeks.  On follow-up patient need a basic metabolic profile as well as a magnesium level done to follow-up on electrolytes and renal function. 2. Follow-up with Dr. Angelena Form, cardiology for further evaluation and management of A. fib.   Discharge Diagnoses:  Principal Problem:   Sepsis ([place]) Active Problems:   Paroxysmal A-fib (HCC)   C. difficile colitis   History of atrial fibrillation   CKD (chronic kidney disease), stage III   Diarrhea   CHF (congestive heart failure) (HCC)   Hypomagnesemia   Discharge Condition: Stable and improved  Diet recommendation: Heart healthy  Filed Weights   05/28/19 1958 05/31/19 1211 06/01/19 0500  Weight: 70 kg 68.9 kg 68.2 kg    History of present illness:  HPI per Dr. Doreatha Martin is a 85 y.o. female with medical history significant of CKD stage III, CAD status post PCI, chronic atrial fibrillation, diastolic congestive heart failure, CVA, hypertension, hyperlipidemia, giant cell arteritis, RA on chronic steroids, chronic back pain status post L2-3 and L3-4 posterior fusion in 2015 and an L5-S1 microdiscectomy in 2017 presenting to the ED with complaints of fever and not feeling well.  Patient states she had a fever of 102.5 F today.  States she received her second Covid vaccine over 3 weeks ago.  Reports having a chronic cough secondary to vocal cord dysfunction.  No chest pain or shortness of breath.  Reports chronic intermittent right lower quadrant abdominal pain for which she is supposed to see a gastroenterologist.  Denies dysuria.  States she had dry heaves upon arriving to the ED but no nausea or vomiting at home.  She has been able to tolerate p.o.  intake at home without any difficulty.  Does report having diarrhea for the past few days.  Reports having chronic lower back pain due to history of prior surgeries for which she takes hydrocodone, no recent change.  ED Course: Febrile with temperature 102 F.  Not tachycardic or hypotensive.  Slightly tachypneic but not hypoxic.  WBC count 20.3.  Lactic acid 2.4.  UA and urine culture pending.  Blood culture x2 pending.  SARS-CoV-2 rapid antigen test negative, PCR test pending.  High-sensitivity troponin level pending.  Lipase level pending.  LFTs normal. Chest x-ray showing mild increased central vascular congestion new from prior exam.  No interstitial edema noted.   CT abdomen pelvis showing sigmoid diverticulosis with mild perisigmoid stranding thought to be likely chronic.  Mild acute diverticulitis favored to be less likely.  No evidence of bowel obstruction. Patient received Tylenol, fentanyl, Zofran, vancomycin, cefepime, metronidazole, and 1 L LR bolus.  Hospital Course:  1 sepsis secondary to C. difficile colitis Patient was admitted with sepsis and work-up noted to be positive for C. difficile colitis.  Patient had increasing loose stools.  Patient started on oral vancomycin and improved clinically.  Number of stools decreased.  Patient will be discharged home on 7 more days of oral vancomycin to complete a 10-day course of treatment.  Patient also be discharged on Florastor.  Patient be discharged in stable and improved condition.   2.  A. fib with RVR CHA2DS2VASC > 4 Patient noted to be in A. fib with RVR with heart rate sustaining > 130s  the night of 05/30/2019 through the day of 05/31/2019.  Patient remained asymptomatic.  Patient was maintained on home regimen of Cardizem and Lopressor.  Patient received Lopressor 5 mg IV x1 as well as Cardizem 10 mg IV x1 however rate was not controlled and as such cardiology consultation obtained.  Patient was seen in consultation by Dr. Percival Spanish and  patient received IV digoxin.  Patient subsequently converted to normal sinus rhythms with PVCs the night of 05/31/2019 and remained in normal sinus rhythm with heart rates in the 70s.  Cardiology recommended increasing patient's home regimen of Cardizem to Cardizem CD 240 mg daily in addition to continuing home regimen of Lopressor.  Close outpatient follow-up with cardiology.   3.  Chronic grade 2 diastolic CHF Stable.  ARB and diuretics were held during the hospitalization be resumed on discharge.  Patient's blood pressure was borderline and received gentle hydration.  Patient remained euvolemic.  Outpatient follow-up.   4.  PMR/rheumatoid arthritis Patient was maintained on home regimen of prednisone.  Outpatient follow-up.   5.  Chronic kidney disease stage IIIa Diuretics and ARB held and will be resumed on discharge.  Renal function remained stable.    6.  Hypomagnesemia Repleted.  7.  Coronary artery disease status post stent Patient maintained off original Cardizem and Lopressor.  Diuretics and ARB were held and will be resumed on discharge.  Cardizem dose was adjusted for better rate control with outpatient follow-up with cardiology.  8.  History of multiple back surgeries  9.  Osteoporosis Patient maintained on home pain regimen.  10.  Normocytic anemia Patient intends on oral iron supplementation.   11.  Depression Remained stable.  Patient maintained on Lexapro.  12.  Debility/weakness Patient seen by PT/OT who recommended home health therapies.   Procedures:  CT abdomen and pelvis 05/28/2019  Chest x-ray 05/28/2019    Consultations:  Cardiology: Dr. Percival Spanish 05/31/2019  Discharge Exam: Vitals:   06/01/19 0444 06/01/19 0855  BP: (!) 143/64 (!) 144/65  Pulse: 71 85  Resp: 20 18  Temp: 98.7 F (37.1 C) 98 F (36.7 C)  SpO2: 92% 96%    General: NAD Cardiovascular: RRR Respiratory: CTAB  Discharge Instructions   Discharge Instructions    Diet  - low sodium heart healthy   Complete by: As directed    Increase activity slowly   Complete by: As directed      Allergies as of 06/01/2019      Reactions   Dextromethorphan Rash      Medication List    STOP taking these medications   pantoprazole 40 MG tablet Commonly known as: PROTONIX     TAKE these medications   alendronate 70 MG tablet Commonly known as: FOSAMAX Take 1 tablet (70 mg total) by mouth every 7 (seven) days. Take with a full glass of water on an empty stomach. What changed: when to take this   diltiazem 240 MG 24 hr capsule Commonly known as: CARDIZEM CD Take 1 capsule (240 mg total) by mouth daily. What changed:   medication strength  how much to take   Eliquis 5 MG Tabs tablet Generic drug: apixaban TAKE 1 TABLET BY MOUTH TWICE A DAY What changed: how much to take   escitalopram 10 MG tablet Commonly known as: LEXAPRO TAKE 1 TABLET BY MOUTH EVERY DAY   famotidine 20 MG tablet Commonly known as: PEPCID Take 1 tablet (20 mg total) by mouth daily. Start taking on: June 02, 2019   ferrous  sulfate 325 (65 FE) MG tablet Take 1 tablet (325 mg total) by mouth 2 (two) times daily with a meal.   furosemide 40 MG tablet Commonly known as: LASIX Take 1 tablet (40 mg total) by mouth daily. Start taking on: June 03, 2019 What changed:   when to take this  These instructions start on June 03, 2019. If you are unsure what to do until then, ask your doctor or other care provider.   HYDROcodone-acetaminophen 5-325 MG tablet Commonly known as: NORCO/VICODIN Take one tablet by mouth every 8 hours as needed for pain. May refill in two months. What changed:   how much to take  how to take this  when to take this  reasons to take this  additional instructions   lactobacillus acidophilus Tabs tablet Take 2 tablets by mouth 3 (three) times daily.   losartan 100 MG tablet Commonly known as: COZAAR Take 1 tablet (100 mg total) by mouth  daily. Start taking on: June 03, 2019 What changed: These instructions start on June 03, 2019. If you are unsure what to do until then, ask your doctor or other care provider.   metoprolol tartrate 100 MG tablet Commonly known as: LOPRESSOR Take 1 tablet (100 mg total) by mouth 2 (two) times daily. Please schedule an appt for further refills 1st attempt What changed: additional instructions   ondansetron 4 MG disintegrating tablet Commonly known as: Zofran ODT Take 1 tablet (4 mg total) by mouth every 8 (eight) hours as needed for nausea or vomiting. What changed: reasons to take this   potassium chloride 10 MEQ tablet Commonly known as: KLOR-CON Take 1 tablet (10 mEq total) by mouth daily.   predniSONE 10 MG tablet Commonly known as: DELTASONE Take 1 tablet (10 mg total) by mouth daily.   vancomycin 125 MG capsule Commonly known as: VANCOCIN Take 1 capsule (125 mg total) by mouth 4 (four) times daily for 7 days. Continue through 06/07/2019   vitamin C 500 MG tablet Commonly known as: ASCORBIC ACID Take 1,000 mg by mouth daily.   Vitamin D-3 25 MCG (1000 UT) Caps Take 1,000 Units by mouth daily.   vitamin E 180 MG (400 UNITS) capsule Generic drug: vitamin E Take 400 Units by mouth daily.      Allergies  Allergen Reactions  . Dextromethorphan Rash   Follow-up Information    Eulas Post, MD. Go in 15 days.   Specialty: Family Medicine Why: Appointment at 06/16/19 at 10:30am. Please arrive 15 mins early!  Contact information: [place] [address] [phone]        Burnell Blanks, MD.   Specialty: Cardiology Contact information: [place] [address] [place] [address] [phone]        [facility] Follow up.   Specialty: Home Health Services Why: Registered Nurse, Physical Therapy, Occupational Therapy, Aide, Social Worker- office to call with a visit time. If you need the office  please call [phone]           The results of significant diagnostics from this hospitalization (including imaging, microbiology, ancillary and laboratory) are listed below for reference.    Significant Diagnostic Studies: CT Abdomen Pelvis W Contrast  Result Date: 05/28/2019 CLINICAL DATA:  85 year old female with abdominal pain, nausea vomiting. EXAM: CT ABDOMEN AND PELVIS WITH CONTRAST TECHNIQUE: Multidetector CT imaging of the abdomen and pelvis was performed using the standard protocol following bolus administration of intravenous contrast. CONTRAST:  141mL  OMNIPAQUE IOHEXOL 300 MG/ML  SOLN COMPARISON:  CT of the abdomen pelvis dated 04/15/2019. FINDINGS: Lower chest: Borderline cardiomegaly. Multi vessel coronary vascular calcification. Partially visualized pericardial effusion measures 9 mm in thickness similar to prior CT. Diffuse interstitial and interlobular septal prominence of the visualized lung bases may represent edema. Partially visualized trace left pleural effusion. No intra-abdominal free air or free fluid. Hepatobiliary: The liver is grossly unremarkable. Mild intrahepatic biliary ductal dilatation as well as mild dilatation of the common bile duct likely post cholecystectomy. Pancreas: Unremarkable. No pancreatic ductal dilatation or surrounding inflammatory changes. Spleen: Normal in size without focal abnormality. Adrenals/Urinary Tract: The adrenal glands are unremarkable. There is no hydronephrosis on either side. There is symmetric enhancement and excretion of contrast by both kidneys. Subcentimeter bilateral renal hypodense lesions are too small to characterize. The visualized ureters appear unremarkable. The urinary bladder is suboptimally evaluated due to streak artifact caused by right hip arthroplasty. Stomach/Bowel: There is sigmoid diverticulosis. Mild perisigmoid stranding may be chronic. Mild acute diverticulitis is not excluded. Clinical correlation is  recommended. There is no bowel obstruction. The appendix is normal. Vascular/Lymphatic: Moderate aortoiliac atherosclerotic disease. The IVC is unremarkable. No portal venous gas. There is no adenopathy. Reproductive: Hysterectomy. No adnexal masses. Other: Small fat containing umbilical hernia. Musculoskeletal: Osteopenia with extensive degenerative changes of the spine. L2-L4 posterior fusion, old T11 compression fracture and vertebroplasty as well as L1 vertebroplasty. Total right hip arthroplasty. Similar fragmented appearance of the left ischial tuberosity, previously suggested to demonstrate a pathologic fracture. Clinical correlation is recommended. No new fracture. IMPRESSION: 1. Sigmoid diverticulosis. Mild perisigmoid stranding, likely chronic. Mild acute diverticulitis is favored less likely but not excluded. Clinical correlation is recommended. No bowel obstruction. Normal appendix. 2. Additional nonacute findings as above and similar to prior CT including Aortic Atherosclerosis (ICD10-I70.0). Electronically Signed   By: Anner Crete M.D.   On: 05/28/2019 23:47   DG Chest Port 1 View  Result Date: 05/28/2019 CLINICAL DATA:  Fevers and weakness EXAM: PORTABLE CHEST 1 VIEW COMPARISON:  05/25/2019 FINDINGS: Cardiac shadow is again mildly enlarged but accentuated by the portable technique. The lungs are well aerated bilaterally. Very mild vascular congestion is noted increased from the prior study. No interstitial edema is noted. No bony abnormality is seen. IMPRESSION: Mild increased central vascular congestion new from the prior exam. Electronically Signed   By: Inez Catalina M.D.   On: 05/28/2019 21:21   DG Chest Portable 1 View  Result Date: 05/25/2019 CLINICAL DATA:  Lightheaded EXAM: PORTABLE CHEST 1 VIEW COMPARISON:  CT 04/15/2019, radiograph 04/15/2019 FINDINGS: Borderline cardiomegaly. No focal opacity or pleural effusion. Aortic atherosclerosis. No pneumothorax. IMPRESSION: No active  disease. Electronically Signed   By: Donavan Foil M.D.   On: 05/25/2019 19:36    Microbiology: Recent Results (from the past 240 hour(s))  Culture, blood (routine x 2)     Status: None (Preliminary result)   Collection Time: 05/28/19  8:34 PM   Specimen: BLOOD  Result Value Ref Range Status   Specimen Description BLOOD LEFT ANTECUBITAL  Final   Special Requests   Final    BOTTLES DRAWN AEROBIC AND ANAEROBIC Blood Culture adequate volume   Culture   Final    NO GROWTH 4 DAYS Performed at [facility], [address], [place], [place] [address]    Report Status PENDING  Incomplete  Culture, blood (routine x 2)     Status: None (Preliminary result)   Collection Time: 05/28/19  9:04  PM   Specimen: BLOOD RIGHT FOREARM  Result Value Ref Range Status   Specimen Description BLOOD RIGHT FOREARM  Final   Special Requests   Final    BOTTLES DRAWN AEROBIC AND ANAEROBIC Blood Culture results may not be optimal due to an inadequate volume of blood received in culture bottles   Culture   Final    NO GROWTH 4 DAYS Performed at [facility], [place] [address], [place], [place] [address]    Report Status PENDING  Incomplete  SARS CORONAVIRUS 2 (TAT 6-24 HRS) Nasopharyngeal Nasopharyngeal Swab     Status: None   Collection Time: 05/28/19 11:49 PM   Specimen: Nasopharyngeal Swab  Result Value Ref Range Status   SARS Coronavirus 2 NEGATIVE NEGATIVE Final    Comment: (NOTE) SARS-CoV-2 target nucleic acids are NOT DETECTED. The SARS-CoV-2 RNA is generally detectable in upper and lower respiratory specimens during the acute phase of infection. Negative results do not preclude SARS-CoV-2 infection, do not rule out co-infections with other pathogens, and should not be used as the sole basis for treatment or other patient management decisions. Negative results must be combined with clinical observations, patient history, and epidemiological information. The expected result is  Negative. Fact Sheet for Patients: SugarRoll.be Fact Sheet for Healthcare Providers: https://www.woods-mathews.com/ This test is not yet approved or cleared by the [place] FDA and  has been authorized for detection and/or diagnosis of SARS-CoV-2 by FDA under an Emergency Use Authorization (EUA). This EUA will remain  in effect (meaning this test can be used) for the duration of the COVID-19 declaration under Section 56 4(b)(1) of the Act, 21 U.S.C. section 360bbb-3(b)(1), unless the authorization is terminated or revoked sooner. Performed at [facility], [place] [address], [place], [place] [address]   Urine culture     Status: None   Collection Time: 05/29/19  1:47 AM   Specimen: Urine, Random  Result Value Ref Range Status   Specimen Description URINE, RANDOM  Final   Special Requests NONE  Final   Culture   Final    NO GROWTH Performed at [facility], [place] [address], [place], [place] [address]    Report Status 05/29/2019 FINAL  Final  C difficile quick scan w PCR reflex     Status: Abnormal   Collection Time: 05/29/19  8:31 AM   Specimen: STOOL  Result Value Ref Range Status   C Diff antigen POSITIVE (A) NEGATIVE Final   C Diff toxin NEGATIVE NEGATIVE Final   C Diff interpretation Results are indeterminate. See PCR results.  Final    Comment: Performed at [facility], [place] [address], [place], [place] [address]  C. Diff by PCR, Reflexed     Status: Abnormal   Collection Time: 05/29/19  8:31 AM  Result Value Ref Range Status   Toxigenic C. Difficile by PCR POSITIVE (A) NEGATIVE Final    Comment: Positive for toxigenic C. difficile with little to no toxin production. Only treat if clinical presentation suggests symptomatic illness. Performed at [facility], [place] [address], [place], [place] [address]      Labs: Basic Metabolic Panel: Recent Labs  Lab 05/28/19 2009 05/29/19 0414 05/30/19 0404  05/31/19 0455 06/01/19 0322  NA 136 141 140 138 140  K 4.3 4.2 3.5 3.6 4.8  CL 100 102 105 104 107  CO2 24 28 26 24 23   GLUCOSE 153* 118* 90 98 100*  BUN 14 13 10  10  11  CREATININE 0.94 0.89 0.87 0.74 0.99  CALCIUM 9.0 8.4* 8.3* 8.6* 8.5*  MG  --   --   --  1.3* 2.5*   Liver Function Tests: Recent Labs  Lab 05/28/19 2009 05/31/19 0455  AST 21 13*  ALT 12 11  ALKPHOS 76 60  BILITOT 1.1 1.1  PROT 6.9 5.5*  ALBUMIN 3.6 2.7*   Recent Labs  Lab 05/28/19 2348  LIPASE 30   No results for input(s): AMMONIA in the last 168 hours. CBC: Recent Labs  Lab 05/28/19 2009 05/29/19 0414 05/30/19 0404 05/31/19 0455 06/01/19 0322  WBC 20.3* 13.2* 10.0 10.6* 9.9  NEUTROABS 17.2*  --   --   --   --   HGB 12.6 10.6* 9.8* 9.9* 9.4*  HCT 39.6 33.7* 30.4* 31.3* 30.0*  MCV 92.5 91.8 92.1 91.5 91.7  PLT 231 171 162 162 193   Cardiac Enzymes: No results for input(s): CKTOTAL, CKMB, CKMBINDEX, TROPONINI in the last 168 hours. BNP: BNP (last 3 results) Recent Labs    05/30/19 0404 05/31/19 0455 06/01/19 0322  BNP 286.1* 506.7* 507.5*    ProBNP (last 3 results) No results for input(s): PROBNP in the last 8760 hours.  CBG: Recent Labs  Lab 05/25/19 2053  GLUCAP 103*       Signed:  Irine Seal MD.  [facility] 06/01/2019, 2:52 PM

## 2019-06-01 NOTE — Progress Notes (Addendum)
Progress Note  Patient Name: Betty Jackson Date of Encounter: 06/01/2019  Primary Cardiologist: [name], MD   Subjective   Patient converted to NSR with PVCs overnight. No chest pain or SOB.  Inpatient Medications    Scheduled Meds: . apixaban  5 mg Oral BID  . vitamin C  1,000 mg Oral Daily  . cholecalciferol  1,000 Units Oral Daily  . digoxin  0.25 mg Intravenous BID  . diltiazem  180 mg Oral Daily  . escitalopram  10 mg Oral Daily  . famotidine  20 mg Oral Daily  . ferrous sulfate  325 mg Oral BID WC  . lactobacillus acidophilus  2 tablet Oral TID  . metoprolol tartrate  100 mg Oral BID  . predniSONE  10 mg Oral Q breakfast  . vancomycin  125 mg Oral QID  . vitamin E  400 Units Oral Daily   Continuous Infusions:  PRN Meds: acetaminophen **OR** acetaminophen, HYDROcodone-acetaminophen, traMADol, zolpidem   Vital Signs    Vitals:   05/31/19 1820 05/31/19 2038 06/01/19 0444 06/01/19 0500  BP: 123/64 (!) 124/55 (!) 143/64   Pulse: 70 77 71   Resp: 18 20 20    Temp: 98.4 F (36.9 C) 98.2 F (36.8 C) 98.7 F (37.1 C)   TempSrc: Oral Oral Oral   SpO2: 96% 93% 92%   Weight:    68.2 kg  Height:        Intake/Output Summary (Last 24 hours) at 06/01/2019 0846 Last data filed at 06/01/2019 0600 Gross per 24 hour  Intake 603.96 ml  Output 0 ml  Net 603.96 ml   Last 3 Weights 06/01/2019 05/31/2019 05/28/2019  Weight (lbs) 150 lb 5.7 oz 152 lb 154 lb 5.2 oz  Weight (kg) 68.2 kg 68.947 kg 70 kg      Telemetry    NSR, with PVCs, Heart rates in the 70s; - Personally Reviewed  ECG    No new - Personally Reviewed  Physical Exam   GEN: No acute distress.   Neck: No JVD Cardiac: RRR, no murmurs, rubs, or gallops.  Respiratory: Clear to auscultation bilaterally. GI: Soft, nontender, non-distended  MS: Trace edema; No deformity. Neuro:  Nonfocal  Psych: Normal affect   Labs    High Sensitivity Troponin:   Recent Labs  Lab 05/28/19 2348    TROPONINIHS 12      Chemistry Recent Labs  Lab 05/28/19 2009 05/29/19 0414 05/30/19 0404 05/31/19 0455 06/01/19 0322  NA 136   < > 140 138 140  K 4.3   < > 3.5 3.6 4.8  CL 100   < > 105 104 107  CO2 24   < > 26 24 23   GLUCOSE 153*   < > 90 98 100*  BUN 14   < > 10 10 11   CREATININE 0.94   < > 0.87 0.74 0.99  CALCIUM 9.0   < > 8.3* 8.6* 8.5*  PROT 6.9  --   --  5.5*  --   ALBUMIN 3.6  --   --  2.7*  --   AST 21  --   --  13*  --   ALT 12  --   --  11  --   ALKPHOS 76  --   --  60  --   BILITOT 1.1  --   --  1.1  --   GFRNONAA 56*   < > >60 >60 53*  GFRAA >60   < > >60 >  60 >60  ANIONGAP 12   < > 9 10 10    < > = values in this interval not displayed.     Hematology Recent Labs  Lab 05/30/19 0404 05/31/19 0455 06/01/19 0322  WBC 10.0 10.6* 9.9  RBC 3.30* 3.42* 3.27*  HGB 9.8* 9.9* 9.4*  HCT 30.4* 31.3* 30.0*  MCV 92.1 91.5 91.7  MCH 29.7 28.9 28.7  MCHC 32.2 31.6 31.3  RDW 15.7* 15.3 15.4  PLT 162 162 193    BNP Recent Labs  Lab 05/30/19 0404 05/31/19 0455 06/01/19 0322  BNP 286.1* 506.7* 507.5*     DDimer No results for input(s): DDIMER in the last 168 hours.   Radiology    No results found.  Cardiac Studies   Echo 04/17/19 1. Left ventricular ejection fraction, by visual estimation, is 60 to  65%. The left ventricle has normal function. There is mildly increased  left ventricular hypertrophy.  2. Left ventricular diastolic parameters are consistent with Grade II  diastolic dysfunction (pseudonormalization). The mean left atrial pressure  is high.  3. The left ventricle has no regional wall motion abnormalities.  4. Global right ventricle has normal systolic function.The right  ventricular size is normal. No increase in right ventricular wall  thickness.  5. Left atrial size was mildly dilated.  6. Right atrial size was mildly dilated.  7. Mild mitral annular calcification.  8. The mitral valve is normal in structure. Trivial mitral  valve  regurgitation.  9. The tricuspid valve is normal in structure.  10. The tricuspid valve is normal in structure. Tricuspid valve  regurgitation is mild-moderate.  11. The aortic valve is normal in structure. Aortic valve regurgitation is  trivial.  12. The pulmonic valve was not well visualized. Pulmonic valve  regurgitation is not visualized.  13. Mildly elevated pulmonary artery systolic pressure.  14. The tricuspid regurgitant velocity is 2.18 m/s, and with an assumed  right atrial pressure of 15 mmHg, the estimated right ventricular systolic  pressure is mildly elevated at 34.0 mmHg.  15. The inferior vena cava is dilated in size with <50% respiratory  variability, suggesting right atrial pressure of 15 mmHg.  16. There is exaggerated respiratory displacement of the interventricular  septum. This could represent contrictive physiology , but can also be seen  with increased work of breathing (e.g. COPD or asthma exacerbation).  34. Compared to 03/09/2019, left ventricular wall motion and overall  systolic function have improved.   Patient Profile     85 y.o. 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   Assessment & Plan    Afib RVR - prior history of Afib in the setting of infection.  - amiodarone was discontinued 03/2019 secondary to concern for smalll airway disease vs pneumonitis on CTA of chest. Plan was for short term discontinuation.  - Afib RVR reoccurred in the setting of C.diff colitis and was not responsive to IV metoprolol or IV cardizem x 1.  - On Eliquis for a/c - With soft BPs Patient was given some IV dig - Patient converted to NSR with PVCs overnight. Remains in NSR with rates int he 70s  Hypomagnesemia - supplement given  CAD s/p stenting 2005 to RCA - Patent stent by cath  in 2014.  - stress test was low risk 08/2017 -  No chest pain - No aspirin due to need for Eliquis - no statin 2/2 myalgias  Differential colitis - per IM  Chronic diastolic HF - most recent echo April 17, 2019 showed LV function 60-65%, G2DD - mild lower leg edema noted. BNP 286>>506.  - Monitor fluid status with fluid resuscitation - creatinine stable  For questions or updates, please contact [facility] Please consult [email or web address] for contact info under        Signed, Cadence Ninfa Meeker, PA-C  06/01/2019, 8:46 AM    History and all data above reviewed.  Patient examined.  I agree with the findings as above.   She is complaining of back pain.  She does not notice that she is now in NSR. The patient exam reveals COR:RRR  ,  Lungs: Clear  ,  Abd: Positive bowel sounds, no rebound no guarding, Ext No edema  .  All available labs, radiology testing, previous records reviewed. Agree with documented assessment and plan.   Atrial fib:  Back in NSR.  I will take this opportunity to increase the Cardizem slightly.  Otherwise continue meds as listed.  We will arrange follow up in our office (or virtual visit).    Jeneen Rinks Alexes Lamarque  10:26 AM  06/01/2019

## 2019-06-01 NOTE — Progress Notes (Signed)
DISCHARGE NOTE HOME Betty Jackson to be discharged Home per MD order. Discussed prescriptions and follow up appointments with the patient. Prescriptions given to patient; medication list explained in detail. Patient verbalized understanding.  Skin clean, dry and intact without evidence of skin break down, no evidence of skin tears noted. IV catheter discontinued intact. Site without signs and symptoms of complications. Dressing and pressure applied. Pt denies pain at the site currently. No complaints noted.  Patient free of lines, drains, and wounds.   An After Visit Summary (AVS) was printed and given to the patient. Patient escorted via wheelchair, and discharged home via private auto.  Paulla Fore, RN, BSN

## 2019-06-01 NOTE — Progress Notes (Signed)
Daughter, [name], called for update. She voiced concerned regarding pt's mobility and asked for home health. MD Grandville Silos notified and he PT/OT orders were placed. PT/OT to assess pt for imminent  DC.   Paulla Fore, RN, BSN

## 2019-06-02 ENCOUNTER — Telehealth: Payer: Self-pay | Admitting: *Deleted

## 2019-06-02 ENCOUNTER — Ambulatory Visit: Payer: Medicare Other | Admitting: Nurse Practitioner

## 2019-06-02 LAB — CULTURE, BLOOD (ROUTINE X 2)
Culture: NO GROWTH
Culture: NO GROWTH
Special Requests: ADEQUATE

## 2019-06-02 NOTE — Telephone Encounter (Signed)
Transition Care Management Follow-up Telephone Call   Date discharged? 03.04.2021   How have you been since you were released from the hospital? "I'm doing alright I guess"     Do you understand why you were in the hospital? yes   Do you understand the discharge instructions? "I haven't  read them yet"    Where were you discharged to? Home    Items Reviewed:  Medications reviewed: yes  Allergies reviewed: yes  Dietary changes reviewed: yes, heart healthy   Referrals reviewed: N/A    Functional Questionnaire:   Activities of Daily Living (ADLs):   She states they are independent in the following: feeding, continence, grooming, toileting and dressing States they require assistance with the following: ambulation and bathing and hygiene   Any transportation issues/concerns?: no   Any patient concerns? Yes, wanted another pain medication but sees pain management to get hyrdocodone, patient verbalizes this is another medication she can take with it but was unusure of the name.    Confirmed importance and date/time of follow-up visits scheduled yes  Provider Appointment booked with Dr. Elease Hashimoto 06/14/2019 at 1:15PM   Confirmed with patient if condition begins to worsen call PCP or go to the ER.  Patient was given the office number and encouraged to call back with question or concerns.  : yes

## 2019-06-04 ENCOUNTER — Other Ambulatory Visit: Payer: Self-pay | Admitting: Cardiovascular Disease

## 2019-06-04 DIAGNOSIS — H269 Unspecified cataract: Secondary | ICD-10-CM | POA: Diagnosis not present

## 2019-06-04 DIAGNOSIS — M316 Other giant cell arteritis: Secondary | ICD-10-CM | POA: Diagnosis not present

## 2019-06-04 DIAGNOSIS — I480 Paroxysmal atrial fibrillation: Secondary | ICD-10-CM | POA: Diagnosis not present

## 2019-06-04 DIAGNOSIS — N183 Chronic kidney disease, stage 3 unspecified: Secondary | ICD-10-CM | POA: Diagnosis not present

## 2019-06-04 DIAGNOSIS — K5790 Diverticulosis of intestine, part unspecified, without perforation or abscess without bleeding: Secondary | ICD-10-CM | POA: Diagnosis not present

## 2019-06-04 DIAGNOSIS — I5032 Chronic diastolic (congestive) heart failure: Secondary | ICD-10-CM | POA: Diagnosis not present

## 2019-06-04 DIAGNOSIS — I130 Hypertensive heart and chronic kidney disease with heart failure and stage 1 through stage 4 chronic kidney disease, or unspecified chronic kidney disease: Secondary | ICD-10-CM | POA: Diagnosis not present

## 2019-06-04 DIAGNOSIS — A419 Sepsis, unspecified organism: Secondary | ICD-10-CM | POA: Diagnosis not present

## 2019-06-04 DIAGNOSIS — M80052D Age-related osteoporosis with current pathological fracture, left femur, subsequent encounter for fracture with routine healing: Secondary | ICD-10-CM | POA: Diagnosis not present

## 2019-06-04 DIAGNOSIS — D631 Anemia in chronic kidney disease: Secondary | ICD-10-CM | POA: Diagnosis not present

## 2019-06-04 DIAGNOSIS — I2510 Atherosclerotic heart disease of native coronary artery without angina pectoris: Secondary | ICD-10-CM | POA: Diagnosis not present

## 2019-06-04 DIAGNOSIS — A0472 Enterocolitis due to Clostridium difficile, not specified as recurrent: Secondary | ICD-10-CM | POA: Diagnosis not present

## 2019-06-05 ENCOUNTER — Other Ambulatory Visit: Payer: Self-pay | Admitting: *Deleted

## 2019-06-05 DIAGNOSIS — K5790 Diverticulosis of intestine, part unspecified, without perforation or abscess without bleeding: Secondary | ICD-10-CM | POA: Diagnosis not present

## 2019-06-05 DIAGNOSIS — I130 Hypertensive heart and chronic kidney disease with heart failure and stage 1 through stage 4 chronic kidney disease, or unspecified chronic kidney disease: Secondary | ICD-10-CM | POA: Diagnosis not present

## 2019-06-05 DIAGNOSIS — A419 Sepsis, unspecified organism: Secondary | ICD-10-CM | POA: Diagnosis not present

## 2019-06-05 DIAGNOSIS — M80052D Age-related osteoporosis with current pathological fracture, left femur, subsequent encounter for fracture with routine healing: Secondary | ICD-10-CM | POA: Diagnosis not present

## 2019-06-05 DIAGNOSIS — I2510 Atherosclerotic heart disease of native coronary artery without angina pectoris: Secondary | ICD-10-CM | POA: Diagnosis not present

## 2019-06-05 DIAGNOSIS — N183 Chronic kidney disease, stage 3 unspecified: Secondary | ICD-10-CM | POA: Diagnosis not present

## 2019-06-05 DIAGNOSIS — D631 Anemia in chronic kidney disease: Secondary | ICD-10-CM | POA: Diagnosis not present

## 2019-06-05 DIAGNOSIS — I480 Paroxysmal atrial fibrillation: Secondary | ICD-10-CM | POA: Diagnosis not present

## 2019-06-05 DIAGNOSIS — I5032 Chronic diastolic (congestive) heart failure: Secondary | ICD-10-CM | POA: Diagnosis not present

## 2019-06-05 DIAGNOSIS — H269 Unspecified cataract: Secondary | ICD-10-CM | POA: Diagnosis not present

## 2019-06-05 DIAGNOSIS — A0472 Enterocolitis due to Clostridium difficile, not specified as recurrent: Secondary | ICD-10-CM | POA: Diagnosis not present

## 2019-06-05 DIAGNOSIS — M316 Other giant cell arteritis: Secondary | ICD-10-CM | POA: Diagnosis not present

## 2019-06-05 NOTE — Patient Outreach (Signed)
[facility]) Care Management  06/05/2019  YANET FRUTIGER [birth year] [id number]    Telephone Assessment  RN spoke with daughter [name] due to pt's voice disorder. Explained [facility] services and the purpose for today's call. Caregiver discussed issues for needed resources. States needs related to a nurse's aide and meal services. RN offered to make a referral for [facility] social work to contact her directly with available resources (receptive). Several resources mentioned for transport and meal delivery. Also educated on other [facility] services related to pharmacy and nursing. Inquired further on pt's medical issues and educated on HF action zone and verified pt weighs daily and monitors her condition that are currently under control. Daughter states no needs however appreciative and grateful for the information however states no needs at this time and offered to call if she has further questions or inquires. Offered to send [facility] packet along with monthly or quarterly follow up calls however caregiver opt to declined at this time. RN verified pt has a pending appointment with her provider in 2 weeks. No further needs as RN will send the requested information packet and alert primary provider of pt's disposition with [facility] services.   Plan: Will close this discipline.

## 2019-06-07 ENCOUNTER — Telehealth: Payer: Self-pay | Admitting: Family Medicine

## 2019-06-07 DIAGNOSIS — M316 Other giant cell arteritis: Secondary | ICD-10-CM | POA: Diagnosis not present

## 2019-06-07 DIAGNOSIS — M80052D Age-related osteoporosis with current pathological fracture, left femur, subsequent encounter for fracture with routine healing: Secondary | ICD-10-CM | POA: Diagnosis not present

## 2019-06-07 DIAGNOSIS — A419 Sepsis, unspecified organism: Secondary | ICD-10-CM | POA: Diagnosis not present

## 2019-06-07 DIAGNOSIS — H269 Unspecified cataract: Secondary | ICD-10-CM | POA: Diagnosis not present

## 2019-06-07 DIAGNOSIS — I480 Paroxysmal atrial fibrillation: Secondary | ICD-10-CM | POA: Diagnosis not present

## 2019-06-07 DIAGNOSIS — I5032 Chronic diastolic (congestive) heart failure: Secondary | ICD-10-CM | POA: Diagnosis not present

## 2019-06-07 DIAGNOSIS — I2510 Atherosclerotic heart disease of native coronary artery without angina pectoris: Secondary | ICD-10-CM | POA: Diagnosis not present

## 2019-06-07 DIAGNOSIS — A0472 Enterocolitis due to Clostridium difficile, not specified as recurrent: Secondary | ICD-10-CM | POA: Diagnosis not present

## 2019-06-07 DIAGNOSIS — K5790 Diverticulosis of intestine, part unspecified, without perforation or abscess without bleeding: Secondary | ICD-10-CM | POA: Diagnosis not present

## 2019-06-07 DIAGNOSIS — I130 Hypertensive heart and chronic kidney disease with heart failure and stage 1 through stage 4 chronic kidney disease, or unspecified chronic kidney disease: Secondary | ICD-10-CM | POA: Diagnosis not present

## 2019-06-07 DIAGNOSIS — D631 Anemia in chronic kidney disease: Secondary | ICD-10-CM | POA: Diagnosis not present

## 2019-06-07 DIAGNOSIS — N183 Chronic kidney disease, stage 3 unspecified: Secondary | ICD-10-CM | POA: Diagnosis not present

## 2019-06-07 NOTE — Telephone Encounter (Signed)
Please see message. °

## 2019-06-07 NOTE — Telephone Encounter (Signed)
Called number provided and spoke to [place] and she stated that she will put in a message to [facility] with the verbal OK for orders per Dr. Elease Hashimoto.

## 2019-06-07 NOTE — Telephone Encounter (Signed)
[employer] and gave her the verbal OK per Dr. Elease Hashimoto. Amber verbalized an understanding.

## 2019-06-07 NOTE — Telephone Encounter (Signed)
Betty Jackson from [facility] needs verbal orders 1w1 and 2w3 for strength, balance and general mobility.   Betty Jackson can be reached at [phone] ok to leave a detailed message per [employer].

## 2019-06-07 NOTE — Telephone Encounter (Signed)
Betty Jackson needs verbal orders for nursing.  Can be reached at [phone] ask for [name]

## 2019-06-07 NOTE — Telephone Encounter (Signed)
Okay to proceed with orders as requested 

## 2019-06-07 NOTE — Telephone Encounter (Signed)
OK 

## 2019-06-07 NOTE — Telephone Encounter (Signed)
OK for nursing? Please see new message.

## 2019-06-08 ENCOUNTER — Other Ambulatory Visit: Payer: Self-pay | Admitting: Cardiovascular Disease

## 2019-06-08 ENCOUNTER — Ambulatory Visit: Payer: Medicare Other | Admitting: Physician Assistant

## 2019-06-08 ENCOUNTER — Other Ambulatory Visit: Payer: Self-pay

## 2019-06-08 ENCOUNTER — Encounter: Payer: Self-pay | Admitting: Physician Assistant

## 2019-06-08 ENCOUNTER — Telehealth: Payer: Self-pay | Admitting: *Deleted

## 2019-06-08 VITALS — BP 124/60 | HR 58 | Ht 64.0 in | Wt 149.0 lb

## 2019-06-08 DIAGNOSIS — Z981 Arthrodesis status: Secondary | ICD-10-CM | POA: Diagnosis not present

## 2019-06-08 DIAGNOSIS — I10 Essential (primary) hypertension: Secondary | ICD-10-CM

## 2019-06-08 DIAGNOSIS — M545 Low back pain: Secondary | ICD-10-CM | POA: Diagnosis not present

## 2019-06-08 DIAGNOSIS — I5032 Chronic diastolic (congestive) heart failure: Secondary | ICD-10-CM

## 2019-06-08 DIAGNOSIS — I480 Paroxysmal atrial fibrillation: Secondary | ICD-10-CM | POA: Diagnosis not present

## 2019-06-08 DIAGNOSIS — R42 Dizziness and giddiness: Secondary | ICD-10-CM | POA: Diagnosis not present

## 2019-06-08 DIAGNOSIS — S3210XS Unspecified fracture of sacrum, sequela: Secondary | ICD-10-CM | POA: Diagnosis not present

## 2019-06-08 DIAGNOSIS — I25118 Atherosclerotic heart disease of native coronary artery with other forms of angina pectoris: Secondary | ICD-10-CM | POA: Diagnosis not present

## 2019-06-08 MED ORDER — DILTIAZEM HCL ER COATED BEADS 180 MG PO CP24: 180.0000 mg | ORAL_CAPSULE | Freq: Every day | ORAL | 3 refills | Status: DC

## 2019-06-08 NOTE — Telephone Encounter (Signed)
  Patient Consent for Virtual Visit         Betty Jackson has provided verbal consent on 06/08/2019 for a virtual visit (video or telephone).   CONSENT FOR VIRTUAL VISIT FOR:  Betty Jackson  By participating in this virtual visit I agree to the following:  I hereby voluntarily request, consent and authorize [facility] and its employed or contracted physicians, physician assistants, nurse practitioners or other licensed health care professionals (the Practitioner), to provide me with telemedicine health care services (the "Services") as deemed necessary by the treating Practitioner. I acknowledge and consent to receive the Services by the Practitioner via telemedicine. I understand that the telemedicine visit will involve communicating with the Practitioner through live audiovisual communication technology and the disclosure of certain medical information by electronic transmission. I acknowledge that I have been given the opportunity to request an in-person assessment or other available alternative prior to the telemedicine visit and am voluntarily participating in the telemedicine visit.  I understand that I have the right to withhold or withdraw my consent to the use of telemedicine in the course of my care at any time, without affecting my right to future care or treatment, and that the Practitioner or I may terminate the telemedicine visit at any time. I understand that I have the right to inspect all information obtained and/or recorded in the course of the telemedicine visit and may receive copies of available information for a reasonable fee.  I understand that some of the potential risks of receiving the Services via telemedicine include:  Marland Kitchen Delay or interruption in medical evaluation due to technological equipment failure or disruption; . Information transmitted may not be sufficient (e.g. poor resolution of images) to allow for appropriate medical decision making by the Practitioner;  and/or  . In rare instances, security protocols could fail, causing a breach of personal health information.  Furthermore, I acknowledge that it is my responsibility to provide information about my medical history, conditions and care that is complete and accurate to the best of my ability. I acknowledge that Practitioner's advice, recommendations, and/or decision may be based on factors not within their control, such as incomplete or inaccurate data provided by me or distortions of diagnostic images or specimens that may result from electronic transmissions. I understand that the practice of medicine is not an exact science and that Practitioner makes no warranties or guarantees regarding treatment outcomes. I acknowledge that a copy of this consent can be made available to me via my patient portal ([facility]), or I can request a printed copy by calling the office of [facility].    I understand that my insurance will be billed for this visit.   I have read or had this consent read to me. . I understand the contents of this consent, which adequately explains the benefits and risks of the Services being provided via telemedicine.  . I have been provided ample opportunity to ask questions regarding this consent and the Services and have had my questions answered to my satisfaction. . I give my informed consent for the services to be provided through the use of telemedicine in my medical care

## 2019-06-08 NOTE — Progress Notes (Signed)
Cardiology Office Note    Date:  06/08/2019   ID:  Betty Jackson, DOB Nov 29, 1934, MRN [id number]  PCP:  Eulas Post, MD  Cardiologist: [name], MD   Chief Complaint: Hospital follow up   History of Present Illness:   Betty Jackson is a 85 y.o. female  with hx of PAF, CAD, HTN, HLD, Chronic diastolic CHF, hx of pericardial effusion s/p window 02/2019, CKD III and temporal arteritison long term steroids seen for hospital follow up.   Hx of CAD s/p stenting to RCA in 2005. Cardiac catheterization in September 2014 showed patent RCA stent, mild disease in the left circumflex artery and LAD. Last stress Myoview in June 2019 was normal.  Prior hx of recurrent afib in setting of influenza and diverticulitis.   Admitted 02/2019 for septic shock/cardiac temponade 2nd to pericardiac effusion s/p sub-xiphoid window by Dr. Darcey Nora. The effusion was culture negative and pathology only showed inflammation. Started on amiodarone to suppress afib for short term.   Admitted 03/2019 for Left ischium fracture. Cardiology seen the patient for elevated troponin in the setting of possible sepsis but blood cultures were negative.CT of chest concerning for small airway disease versus pneumonitis. Discontinued amiodarone. Echo  with normal LV systolic function, no significant valve disease.   Seen by Dr. Angelena Form for follow up 05/25/19. No plan for ischemic eval.  Off statin due to leg pain and abdominal pain.  Admitted again 05/28/19 for sepsis 2nd to C. Difficile colitis. Hospital course complicated by recurrent afib RVR and converted with IV digoxin. Increased home Cardizem CD to 240mg  qd in addition to home lopressor.   Here today for follow up with daughter.  Patient having intermittent dizziness/near syncope even before most recent admission.  She was evaluated in ER 2/25 for near syncope and noted hypotensive.  Her dizziness occurs when trying to stand up from a laying or sitting  position however she also feels dizzy while laying down.  No associated diaphoresis, palpitation or syncope.  Compliant with Eliquis.  No bleeding issue.  Sometimes patient can tell when goes in atrial fibrillation.  She did not think she had A. fib episode since discharge.   Past Medical History:  Diagnosis Date  . Acute respiratory failure with hypoxia ([place]) 05/20/2017  . Anemia, unspecified 10/28/2012  . Anxiety state, unspecified 10/28/2012  . CAD (coronary artery disease)    a. Stent RCA in [facility];  b. Cath approx 2009 - nonobs per pt report.  . Cataract    immature on the left eye  . Chronic insomnia 02/07/2013  . Chronic lower back pain    scoliosis  . CKD (chronic kidney disease) stage 3, GFR 30-59 ml/min   . Complication of anesthesia    pt has a very high tolerance to meds  . CVA (cerebral infarction) 10/29/2012  . DDD (degenerative disc disease)   . Depression   . Diverticulitis    hx of  . Diverticulosis   . Enteritis   . GERD (gastroesophageal reflux disease) 09/01/2010  . Giant cell arteritis ([place])   . Hemorrhoids   . Herniated nucleus pulposus, L5-S1, right 11/04/2015  . History of scabies   . HTN (hypertension) 05/20/2017  . Hyperlipidemia    takes Lipitor daily  . Hypertension    takes Amlodipine,Losartan,Metoprolol,and HCTZ daily  . Incontinence of urine   . Insomnia    takes Restoril nightly  . Lumbar stenosis 04/25/2013  . Major depressive disorder, recurrent episode, moderate (  [place]) 07/16/2013  . Migraines    "back in my 20's; none since" (10/28/2012)  . Osteoporosis   . PAF (paroxysmal atrial fibrillation) ([place]) 2011   a. lone epidode in 2011 according to notes.  . Rheumatoid arthritis ([place])   . Scoliosis   . Sepsis ([place]) 05/2019  . Sinus bradycardia    a. on chronic bb  . Temporal arteritis ([place]) 2011   a. followed @ [facility]; potential flareup 10/28/2012/notes 10/28/2012  . Vocal cord dysfunction    "they don't operate properly" (10/28/2012)    Past  Surgical History:  Procedure Laterality Date  . ABDOMINAL HYSTERECTOMY  ~ 1984   vaginally  . BACK SURGERY  7-77yrs ago   X Stop  . BLADDER SUSPENSION  2001  . BREAST BIOPSY Right   . CATARACT EXTRACTION W/ INTRAOCULAR LENS IMPLANT Right ~ 08/2012  . CHEST TUBE INSERTION Left 03/08/2019   Procedure: Chest Tube Insertion;  Surgeon: Ivin Poot, MD;  Location: [facility];  Service: Thoracic;  Laterality: Left;  . COLONOSCOPY  01/26/2012   Procedure: COLONOSCOPY;  Surgeon: Ladene Artist, MD,FACG;  Location: [facility] ENDOSCOPY;  Service: Endoscopy;  Laterality: N/A;  note the EGD is possible  . CORONARY ANGIOPLASTY WITH STENT PLACEMENT  2006   X 1 stent  . EPIDURAL BLOCK INJECTION    . ESOPHAGOGASTRODUODENOSCOPY  01/26/2012   Procedure: ESOPHAGOGASTRODUODENOSCOPY (EGD);  Surgeon: Ladene Artist, MD,FACG;  Location: [facility] ENDOSCOPY;  Service: Endoscopy;  Laterality: N/A;  . HEMIARTHROPLASTY HIP Right 2012  . IR EPIDUROGRAPHY  04/20/2019  . LAPAROSCOPIC CHOLECYSTECTOMY  2001  . LUMBAR FUSION  03/2013  . LUMBAR LAMINECTOMY/DECOMPRESSION MICRODISCECTOMY Right 11/04/2015   Procedure: Right Lumbar Five-Sacral One Microdiskectomy;  Surgeon: Kristeen Miss, MD;  Location: [facility] NEURO ORS;  Service: Neurosurgery;  Laterality: Right;  Right L5-S1 Microdiskectomy  . moles removed that required stiches     one on leg and one on face  . SUBXYPHOID PERICARDIAL WINDOW N/A 03/08/2019   Procedure: SUBXYPHOID PERICARDIAL WINDOW;  Surgeon: Ivin Poot, MD;  Location: [facility];  Service: Thoracic;  Laterality: N/A;  . TEE WITHOUT CARDIOVERSION N/A 03/08/2019   Procedure: TRANSESOPHAGEAL ECHOCARDIOGRAM (TEE);  Surgeon: Prescott Gum, Collier Salina, MD;  Location: [facility];  Service: Thoracic;  Laterality: N/A;  . TEMPORAL ARTERY BIOPSY / LIGATION Bilateral 2011  . TONSILLECTOMY AND ADENOIDECTOMY     at age 7  . TUBAL LIGATION  ~ 1982  . X-STOP IMPLANTATION  ~ 2010   "lower back" (10/28/2012)    Current Medications: Prior to Admission  medications   Medication Sig Start Date End Date Taking? Authorizing Provider  alendronate (FOSAMAX) 70 MG tablet Take 1 tablet (70 mg total) by mouth every 7 (seven) days. Take with a full glass of water on an empty stomach. Patient taking differently: Take 70 mg by mouth every Tuesday. Take with a full glass of water on an empty stomach. 04/28/19   Burchette, Alinda Sierras, MD  Cholecalciferol (VITAMIN D-3) 25 MCG (1000 UT) CAPS Take 1,000 Units by mouth daily.    [provider]  diltiazem (CARDIZEM CD) 240 MG 24 hr capsule Take 1 capsule (240 mg total) by mouth daily. 06/01/19   Eugenie Filler, MD  ELIQUIS 5 MG TABS tablet TAKE 1 TABLET BY MOUTH TWICE A DAY Patient taking differently: Take 5 mg by mouth 2 (two) times daily.  05/15/19   Burnell Blanks, MD  escitalopram (LEXAPRO) 10 MG tablet TAKE 1 TABLET BY MOUTH EVERY DAY  Patient taking differently: Take 10 mg by mouth daily.  12/14/18   Burchette, Alinda Sierras, MD  famotidine (PEPCID) 20 MG tablet Take 1 tablet (20 mg total) by mouth daily. 06/02/19   Eugenie Filler, MD  ferrous sulfate 325 (65 FE) MG tablet Take 1 tablet (325 mg total) by mouth 2 (two) times daily with a meal. 04/21/19   Oswald Hillock, MD  furosemide (LASIX) 40 MG tablet Take 1 tablet (40 mg total) by mouth daily. 06/03/19   Eugenie Filler, MD  HYDROcodone-acetaminophen (NORCO/VICODIN) 5-325 MG tablet Take one tablet by mouth every 8 hours as needed for pain. May refill in two months. Patient taking differently: Take 1 tablet by mouth every 6 (six) hours as needed (for back pain).  08/05/18   Burchette, Alinda Sierras, MD  lactobacillus acidophilus (BACID) TABS tablet Take 2 tablets by mouth 3 (three) times daily. 06/01/19   Eugenie Filler, MD  losartan (COZAAR) 100 MG tablet Take 1 tablet (100 mg total) by mouth daily. 06/03/19   Eugenie Filler, MD  metoprolol tartrate (LOPRESSOR) 100 MG tablet Take 1 tablet (100 mg total) by mouth 2 (two) times daily. Please schedule an  appt for further refills 1st attempt Patient taking differently: Take 100 mg by mouth 2 (two) times daily.  04/26/19   Burnell Blanks, MD  ondansetron (ZOFRAN ODT) 4 MG disintegrating tablet Take 1 tablet (4 mg total) by mouth every 8 (eight) hours as needed for nausea or vomiting. Patient taking differently: Take 4 mg by mouth every 8 (eight) hours as needed for nausea or vomiting (DISSOLVE IN THE MOUTH).  01/12/19   Gareth Morgan, MD  potassium chloride SA (KLOR-CON) 10 MEQ tablet Take 1 tablet (10 mEq total) by mouth daily. 04/21/19   Oswald Hillock, MD  predniSONE (DELTASONE) 10 MG tablet Take 1 tablet (10 mg total) by mouth daily. 04/21/19   Oswald Hillock, MD  vancomycin (VANCOCIN) 125 MG capsule Take 1 capsule (125 mg total) by mouth 4 (four) times daily for 7 days. Continue through 06/07/2019 06/01/19 06/08/19  Eugenie Filler, MD  vitamin C (ASCORBIC ACID) 500 MG tablet Take 1,000 mg by mouth daily.     [provider]  vitamin E (VITAMIN E) 400 UNIT capsule Take 400 Units by mouth daily.    [provider]    Allergies:   Dextromethorphan   Social History   Socioeconomic History  . Marital status: Married    Spouse name: Not on file  . Number of children: 3  . Years of education: Not on file  . Highest education level: Not on file  Occupational History  . Occupation: Retired Cabin crew  Tobacco Use  . Smoking status: Never Smoker  . Smokeless tobacco: Never Used  Substance and Sexual Activity  . Alcohol use: Yes    Comment: socially  . Drug use: No  . Sexual activity: Not on file  Other Topics Concern  . Not on file  Social History Narrative   Lives in [place] with her husband.  Retired Cabin crew.   Social Determinants of Health   Financial Resource Strain:   . Difficulty of Paying Living Expenses:   Food Insecurity:   . Worried About Charity fundraiser in the Last Year:   . Arboriculturist in the Last Year:   Transportation Needs:   .  Film/video editor (Medical):   Marland Kitchen Lack of Transportation (Non-Medical):   Physical Activity:   .  Days of Exercise per Week:   . Minutes of Exercise per Session:   Stress:   . Feeling of Stress :   Social Connections:   . Frequency of Communication with Friends and Family:   . Frequency of Social Gatherings with Friends and Family:   . Attends Religious Services:   . Active Member of Clubs or Organizations:   . Attends Archivist Meetings:   Marland Kitchen Marital Status:      Family History:  The patient's family history includes Brain cancer (age of onset: 66) in her mother; Breast cancer (age of onset: 55) in her daughter; Heart attack in her father; Heart disease in an other family member; Hypertension in an other family member.   ROS:   Please see the history of present illness.    ROS All other systems reviewed and are negative.   PHYSICAL EXAM:   VS:  BP 124/60   Pulse (!) 58   Ht 5\' 4"  (1.626 m)   Wt 149 lb (67.6 kg)   BMI 25.58 kg/m    GEN: Well nourished, well developed, in no acute distress  HEENT: normal  Neck: no JVD, carotid bruits, or masses Cardiac: RRR; no murmurs, rubs, or gallops, Trace edema  Respiratory:  clear to auscultation bilaterally, normal work of breathing GI: soft, nontender, nondistended, + BS MS: no deformity or atrophy  Skin: warm and dry, no rash Neuro:  Alert and Oriented x 3, Strength and sensation are intact Psych: euthymic mood, full affect  Wt Readings from Last 3 Encounters:  06/08/19 149 lb (67.6 kg)  06/01/19 150 lb 5.7 oz (68.2 kg)  05/25/19 145 lb 8.1 oz (66 kg)      Studies/Labs Reviewed:   EKG:  EKG is ordered today.  The ekg ordered today demonstrates sinus bradycardia at rate of 58 BPM, chronic TWI anteriorly   Recent Labs: 03/06/2019: TSH 3.085 05/31/2019: ALT 11 06/01/2019: B Natriuretic Peptide 507.5; BUN 11; Creatinine, Ser 0.99; Hemoglobin 9.4; Magnesium 2.5; Platelets 193; Potassium 4.8; Sodium 140   Lipid  Panel    Component Value Date/Time   CHOL 171 11/14/2018 1420   CHOL 155 08/18/2017 1241   TRIG 168.0 (H) 11/14/2018 1420   HDL 63.40 11/14/2018 1420   HDL 78 08/18/2017 1241   CHOLHDL 3 11/14/2018 1420   VLDL 33.6 11/14/2018 1420   LDLCALC 74 11/14/2018 1420   LDLCALC 51 08/18/2017 1241    Additional studies/ records that were reviewed today include:   EchoJanuary 2021:  1. Left ventricular ejection fraction, by visual estimation, is 60 to  65%. The left ventricle has normal function. There is mildly increased  left ventricular hypertrophy.  2. Left ventricular diastolic parameters are consistent with Grade II  diastolic dysfunction (pseudonormalization). The mean left atrial pressure  is high.  3. The left ventricle has no regional wall motion abnormalities.  4. Global right ventricle has normal systolic function.The right  ventricular size is normal. No increase in right ventricular wall  thickness.  5. Left atrial size was mildly dilated.  6. Right atrial size was mildly dilated.  7. Mild mitral annular calcification.  8. The mitral valve is normal in structure. Trivial mitral valve  regurgitation.  9. The tricuspid valve is normal in structure.  10. The tricuspid valve is normal in structure. Tricuspid valve  regurgitation is mild-moderate.  11. The aortic valve is normal in structure. Aortic valve regurgitation is  trivial.  12. The pulmonic valve was not well visualized.  Pulmonic valve  regurgitation is not visualized.  13. Mildly elevated pulmonary artery systolic pressure.  14. The tricuspid regurgitant velocity is 2.18 m/s, and with an assumed  right atrial pressure of 15 mmHg, the estimated right ventricular systolic  pressure is mildly elevated at 34.0 mmHg.  15. The inferior vena cava is dilated in size with <50% respiratory  variability, suggesting right atrial pressure of 15 mmHg.  16. There is exaggerated respiratory displacement of the  interventricular  septum. This could represent contrictive physiology , but can also be seen  with increased work of breathing (e.g. COPD or asthma exacerbation).  47. Compared to 03/09/2019, left ventricular wall motion and overall  systolic function have improved.     ASSESSMENT & PLAN:    1. Paroxysmal atrial fibrillation - History of atrial fibrillation with RVR in setting of infectious process multiple times.  -  Most recently her amiodarone discontinued (03/2019 admission) secondary to concern for small airway disease versus pneumonitis on CT [place] of the chest.  However plan was short-term per Dr. Lucianne Lei Trigt's note.  -Converted to sinus rhythm with IV digoxin during most recent admission Feb-March 2021 admission  -On Eliquis for anticoagulation.  No bleeding issue. -See below - In sinus rhythm   2..   CAD s/p stenting in 2005 to RCA -Patent stent by cath in 2014.  Last stress test was low risk 08/2017. -No angina -Not on aspirin due to need of anticoagulation -Not on statin due to leg pain  3. Chronic diastolic heart failure -Most recent echocardiogram April 17, 2019 showed LV function of 60 to 123456, grade 2 diastolic dysfunction -Euvolemic -Continue statin  4.  Dizziness/near syncope -On and off for past few months.  She is neither orthostatic by vital nor dizzy.  Orthostatic VS for the past 24 hrs:  BP- Lying Pulse- Lying BP- Sitting Pulse- Sitting BP- Standing at 0 minutes Pulse- Standing at 0 minutes  06/08/19 1128 144/68 56 138/65 59 126/65 66  -Heart rate in 50s.  Blood pressure stable. -Questionable her symptoms due to low heart rate or developing tachybradycardia syndrome  -Reduce Cardizem CD to prior dose of 180 mg daily.  Continue Lopressor 100 mg twice daily. -Advised to get balance before movement.  Use compression stocking. -She will keep a diary of blood pressure, pulse and diet  5.  C. difficile colitis -Still having intermittent diarrhea -Has  follow-up with PCP    Medication Adjustments/Labs and Tests Ordered: Current medicines are reviewed at length with the patient today.  Concerns regarding medicines are outlined above.  Medication changes, Labs and Tests ordered today are listed in the Patient Instructions below. Patient Instructions  Medication Instructions:  Your physician has recommended you make the following change in your medication:  1.  REDUCE the Cardizem to 180 mg taking 1 daily  .  *If you need a refill on your cardiac medications before your next appointment, please call your pharmacy*   Lab Work: None ordered  If you have labs (blood work) drawn today and your tests are completely normal, you will receive your results only by: Marland Kitchen MyChart Message (if you have MyChart) OR . A paper copy in the mail If you have any lab test that is abnormal or we need to change your treatment, we will call you to review the results.   Testing/Procedures: None ordered   Follow-Up: At [facility], you and your health needs are our priority.  As part of our continuing mission to provide  you with exceptional heart care, we have created designated Provider Care Teams.  These Care Teams include your primary Cardiologist (physician) and Advanced Practice Providers (APPs -  Physician Assistants and Nurse Practitioners) who all work together to provide you with the care you need, when you need it.  We recommend signing up for the patient portal called "MyChart".  Sign up information is provided on this After Visit Summary.  MyChart is used to connect with patients for Virtual Visits (Telemedicine).  Patients are able to view lab/test results, encounter notes, upcoming appointments, etc.  Non-urgent messages can be sent to your provider as well.   To learn more about what you can do with MyChart, go to [email or web address].    Your next appointment:   07/19/2019 11:45 A.M.  The format for your next appointment:    Virtual  Provider:   You may see [name], MD or one of the following Advanced Practice Providers on your designated Care Team:    Robbie Lis, [place]    Other Instructions Your [facility] team (Cardiologist [Heart Doctor] and Advanced Practice Provider [phone] Assistant; Nurse Practitioner]) has arranged for your next office appointment to be a virtual visit (also known as "Telehealth", "Telemedicine", "E-Visit").   We now offer virtual visits for all our patients.  This helps [place] to expand our ability to see patients in a timely and safe manner.  These visits are billed to your insurance just like traditional, in person, appointments.  Please review this IMPORTANT information about your upcoming appointment.   **PLEASE READ THE SECTION BELOW LABELED "CONSENT".**   **CALL OUR OFFICE WITH QUESTIONS.**   WHAT YOU NEED FOR YOUR VIRTUAL VISIT:  You will need a SmartPhone with microphone and video capability.  [If you are using MyChart to connect to your visit, it is also possible to use a desktop/laptop computer (with an Internet connection), as long as you have microphone and video capability.]  You will need to use Chrome, Edge or [employer] as your Wellsite geologist.  We highly recommend that you have a MyChart account as this will make connecting to your visit seamless.  A MyChart account not only allows you to connect to your provider for a virtual visit, but also allows you to see the results of all your tests, provider notes, medications and upcoming appointments.    A MyChart account is not absolutely necessary.  We can still complete your visit if you do not have one.    If you do not have a computer or SmartPhone with video/microphone capability or your Internet/cell service is weak on the day of your visit, we will do your visit by telephone.    A blood pressure cuff and scale are essential to collect your vital signs at home.  If you do not have these and you are  unable to obtain them, please contact our office so that we can make arrangements for you.  If you have a pulse oximeter, Apple watch, Kardia mobile device, etc, you can collect data from these devices as well to share with the provider for your visit.  These devices are not required for a virtual visit.    WHAT TO DO ON THE DAY OF YOUR APPOINTMENT: 30 minutes before your appointment:  Take your blood pressure, pulse or heart rate (if your blood pressure machine is able to collect it) and weight.  Write all these numbers down so you can give it to the nurse or medical assistant that calls.  Get all of the medications you currently take and put them where you will be sitting for the appointment.  The nurse/medical assistant will go over these with you when he/she calls.  15 minutes before your appointment:  You will receive a phone call from a nurse or medical assistant from our office.  The caller ID on your phone may indicate that the caller is either "[facility]" or "[place]".  However, the number may come across as spam.  Please turn off any spam blocker so you do not miss the call.  The nurse will:  Ask for your blood pressure, pulse, weight, height.  Go over all of your medications to make sure your chart is correct.  Review your allergies, smoking history, reason for appointment, etc.   Give you instructions on how to connect with the video platform or telephone.  IF YOUR VISIT IS BY TELEPHONE ONLY: After the nurse finishes getting you ready for the visit, your provider will call you on the phone number you provide to [place].   TO CONNECT WITH YOUR PROVIDER FOR YOUR APPOINTMENT (BY VIDEO): You will either connect with the provider with your MyChart account or (if you are not using MyChart) with a link sent to your SmartPhone by text message.  If you are using MyChart, see below.  If you are not using MyChart, the nurse will send you the text message during the phone call.      If you are connecting with your MyChart account:   (The nurse that calls you will tell you when to do this):  You will log into your account. At the top of your home screen, you should see the following prompt that tells you to "Begin your video visit with . . . ".  Click the green button (BEGIN VISIT).    The next screen will say "It's time to start your video visit!" Click the green button (BEGIN VIDEO VISIT)    There may be a screen that appears that asks for permission to use your camera and/or microphone.  Click ALLOW.  This will open the browser where your appointment will take place.   You may see the message: "Welcome.  Waiting for the call to begin."  Or, you will see that the nurse or your provider are already "in" the room waiting on you.   If you are connecting with a link sent to your SmartPhone via text: The nurse that calls you will send the link by text message. Click on the link (it should look something like this):     If asked to give permission to use the camera and or microphone, click ALLOW This will open the browser where your appointment will take place.      You may see the message: "Welcome.  Waiting for the call to begin."  Or, you will see that the nurse or your provider are already "in" the room waiting on you.    The controls for your visit look like the picture below.  Please note that this is what the microphone and camera look like when they are ON.  If muted, they will have a line through them.    After the appointment: Once your provider leaves the appointment, he/she will go over instructions with the nurse/medical assistant.  If needed, this person will call you with any instructions, appointments, etc.   A copy of your After Visit Summary (AVS) will be available later that day in your MyChart  account.  This document will have all of your instructions, medications, appointments, etc.  If you are not using MyChart, we will mail it to your  home.     *CONSENT FOR TELE-HEALTH VISIT - PLEASE REVIEW* By participating in the scheduled virtual visit (and any virtual visit scheduled within 365 days of the printing of this document), I agree to the following:   I hereby voluntarily request, consent and authorize [facility] and its employed or contracted physicians, physician assistants, nurse practitioners or other licensed health care professionals (the Practitioner), to provide me with telemedicine health care services (the "Services") as deemed necessary by the treating Practitioner. I acknowledge and consent to receive the Services by the Practitioner via telemedicine. I understand that the telemedicine visit will involve communicating with the Practitioner through live audiovisual communication technology and the disclosure of certain medical information by electronic transmission. I acknowledge that I have been given the opportunity to request an in-person assessment or other available alternative prior to the telemedicine visit and am voluntarily participating in the telemedicine visit.  I understand that I have the right to withhold or withdraw my consent to the use of telemedicine in the course of my care at any time, without affecting my right to future care or treatment, and that the Practitioner or I may terminate the telemedicine visit at any time. I understand that I have the right to inspect all information obtained and/or recorded in the course of the telemedicine visit and may receive copies of available information for a reasonable fee.  I understand that some of the potential risks of receiving the Services via telemedicine include:  Marland Kitchen Delay or interruption in medical evaluation due to technological equipment failure or disruption; . Information transmitted may not be sufficient (e.g. poor resolution of images) to allow for appropriate medical decision making by the Practitioner; and/or  . In rare instances, security  protocols could fail, causing a breach of personal health information.  Furthermore, I acknowledge that it is my responsibility to provide information about my medical history, conditions and care that is complete and accurate to the best of my ability. I acknowledge that Practitioner's advice, recommendations, and/or decision may be based on factors not within their control, such as incomplete or inaccurate data provided by me or distortions of diagnostic images or specimens that may result from electronic transmissions. I understand that the practice of medicine is not an exact science and that Practitioner makes no warranties or guarantees regarding treatment outcomes. I acknowledge that a copy of this consent can be made available to me via my patient portal ([place]), or I can request a printed copy by calling the office of [facility].    I understand that my insurance will be billed for this visit.   I have read or had this consent read to me. . I understand the contents of this consent, which adequately explains the benefits and risks of the Services being provided via telemedicine.  . I have been provided ample opportunity to ask questions regarding this consent and the Services and have had my questions answered to my satisfaction. . I give my informed consent for the services to be provided through the use of telemedicine in my medical care        Signed, Leanor Kail, [place]  06/08/2019 11:57 AM    [address], [place], [place]  [address] Phone: [phone]; Fax: [phone]

## 2019-06-08 NOTE — Patient Instructions (Addendum)
Medication Instructions:  Your physician has recommended you make the following change in your medication:  1.  REDUCE the Cardizem to 180 mg taking 1 daily  .  *If you need a refill on your cardiac medications before your next appointment, please call your pharmacy*   Lab Work: None ordered  If you have labs (blood work) drawn today and your tests are completely normal, you will receive your results only by: Betty Jackson MyChart Message (if you have MyChart) OR . A paper copy in the mail If you have any lab test that is abnormal or we need to change your treatment, we will call you to review the results.   Testing/Procedures: None ordered   Follow-Up: At [facility], you and your health needs are our priority.  As part of our continuing mission to provide you with exceptional heart care, we have created designated Provider Care Teams.  These Care Teams include your primary Cardiologist (physician) and Advanced Practice Providers (APPs -  Physician Assistants and Nurse Practitioners) who all work together to provide you with the care you need, when you need it.  We recommend signing up for the patient portal called "MyChart".  Sign up information is provided on this After Visit Summary.  MyChart is used to connect with patients for Virtual Visits (Telemedicine).  Patients are able to view lab/test results, encounter notes, upcoming appointments, etc.  Non-urgent messages can be sent to your provider as well.   To learn more about what you can do with MyChart, go to [email or web address].    Your next appointment:   07/19/2019 11:45 A.M.  The format for your next appointment:   Virtual  Provider:   You may see [name], MD or one of the following Advanced Practice Providers on your designated Care Team:    Robbie Lis, [place]    Other Instructions Your [facility] team (Cardiologist [Heart Doctor] and Advanced Practice Provider [phone] Assistant; Nurse Practitioner])  has arranged for your next office appointment to be a virtual visit (also known as "Telehealth", "Telemedicine", "E-Visit").   We now offer virtual visits for all our patients.  This helps [place] to expand our ability to see patients in a timely and safe manner.  These visits are billed to your insurance just like traditional, in person, appointments.  Please review this IMPORTANT information about your upcoming appointment.   **PLEASE READ THE SECTION BELOW LABELED "CONSENT".**   **CALL OUR OFFICE WITH QUESTIONS.**   WHAT YOU NEED FOR YOUR VIRTUAL VISIT:  You will need a SmartPhone with microphone and video capability.  [If you are using MyChart to connect to your visit, it is also possible to use a desktop/laptop computer (with an Internet connection), as long as you have microphone and video capability.]  You will need to use Chrome, Edge or [employer] as your Wellsite geologist.  We highly recommend that you have a MyChart account as this will make connecting to your visit seamless.  A MyChart account not only allows you to connect to your provider for a virtual visit, but also allows you to see the results of all your tests, provider notes, medications and upcoming appointments.    A MyChart account is not absolutely necessary.  We can still complete your visit if you do not have one.    If you do not have a computer or SmartPhone with video/microphone capability or your Internet/cell service is weak on the day of your visit, we will do your visit by telephone.  A blood pressure cuff and scale are essential to collect your vital signs at home.  If you do not have these and you are unable to obtain them, please contact our office so that we can make arrangements for you.  If you have a pulse oximeter, Apple watch, Kardia mobile device, etc, you can collect data from these devices as well to share with the provider for your visit.  These devices are not required for a virtual visit.    WHAT TO  DO ON THE DAY OF YOUR APPOINTMENT: 30 minutes before your appointment:  Take your blood pressure, pulse or heart rate (if your blood pressure machine is able to collect it) and weight.  Write all these numbers down so you can give it to the nurse or medical assistant that calls.  Get all of the medications you currently take and put them where you will be sitting for the appointment.  The nurse/medical assistant will go over these with you when he/she calls.  15 minutes before your appointment:  You will receive a phone call from a nurse or medical assistant from our office.  The caller ID on your phone may indicate that the caller is either "[facility]" or "[place]".  However, the number may come across as spam.  Please turn off any spam blocker so you do not miss the call.  The nurse will:  Ask for your blood pressure, pulse, weight, height.  Go over all of your medications to make sure your chart is correct.  Review your allergies, smoking history, reason for appointment, etc.   Give you instructions on how to connect with the video platform or telephone.  IF YOUR VISIT IS BY TELEPHONE ONLY: After the nurse finishes getting you ready for the visit, your provider will call you on the phone number you provide to [place].   TO CONNECT WITH YOUR PROVIDER FOR YOUR APPOINTMENT (BY VIDEO): You will either connect with the provider with your MyChart account or (if you are not using MyChart) with a link sent to your SmartPhone by text message.  If you are using MyChart, see below.  If you are not using MyChart, the nurse will send you the text message during the phone call.     If you are connecting with your MyChart account:   (The nurse that calls you will tell you when to do this):  You will log into your account. At the top of your home screen, you should see the following prompt that tells you to "Begin your video visit with . . . ".  Click the green button (BEGIN VISIT).    The  next screen will say "It's time to start your video visit!" Click the green button (BEGIN VIDEO VISIT)    There may be a screen that appears that asks for permission to use your camera and/or microphone.  Click ALLOW.  This will open the browser where your appointment will take place.   You may see the message: "Welcome.  Waiting for the call to begin."  Or, you will see that the nurse or your provider are already "in" the room waiting on you.   If you are connecting with a link sent to your SmartPhone via text: The nurse that calls you will send the link by text message. Click on the link (it should look something like this):     If asked to give permission to use the camera and or microphone, click ALLOW This  will open the browser where your appointment will take place.      You may see the message: "Welcome.  Waiting for the call to begin."  Or, you will see that the nurse or your provider are already "in" the room waiting on you.    The controls for your visit look like the picture below.  Please note that this is what the microphone and camera look like when they are ON.  If muted, they will have a line through them.    After the appointment: Once your provider leaves the appointment, he/she will go over instructions with the nurse/medical assistant.  If needed, this person will call you with any instructions, appointments, etc.   A copy of your After Visit Summary (AVS) will be available later that day in your MyChart account.  This document will have all of your instructions, medications, appointments, etc.  If you are not using MyChart, we will mail it to your home.     *CONSENT FOR TELE-HEALTH VISIT - PLEASE REVIEW* By participating in the scheduled virtual visit (and any virtual visit scheduled within 365 days of the printing of this document), I agree to the following:   I hereby voluntarily request, consent and authorize [facility] and its employed or contracted  physicians, physician assistants, nurse practitioners or other licensed health care professionals (the Practitioner), to provide me with telemedicine health care services (the "Services") as deemed necessary by the treating Practitioner. I acknowledge and consent to receive the Services by the Practitioner via telemedicine. I understand that the telemedicine visit will involve communicating with the Practitioner through live audiovisual communication technology and the disclosure of certain medical information by electronic transmission. I acknowledge that I have been given the opportunity to request an in-person assessment or other available alternative prior to the telemedicine visit and am voluntarily participating in the telemedicine visit.  I understand that I have the right to withhold or withdraw my consent to the use of telemedicine in the course of my care at any time, without affecting my right to future care or treatment, and that the Practitioner or I may terminate the telemedicine visit at any time. I understand that I have the right to inspect all information obtained and/or recorded in the course of the telemedicine visit and may receive copies of available information for a reasonable fee.  I understand that some of the potential risks of receiving the Services via telemedicine include:  Betty Jackson Delay or interruption in medical evaluation due to technological equipment failure or disruption; . Information transmitted may not be sufficient (e.g. poor resolution of images) to allow for appropriate medical decision making by the Practitioner; and/or  . In rare instances, security protocols could fail, causing a breach of personal health information.  Furthermore, I acknowledge that it is my responsibility to provide information about my medical history, conditions and care that is complete and accurate to the best of my ability. I acknowledge that Practitioner's advice, recommendations, and/or decision  may be based on factors not within their control, such as incomplete or inaccurate data provided by me or distortions of diagnostic images or specimens that may result from electronic transmissions. I understand that the practice of medicine is not an exact science and that Practitioner makes no warranties or guarantees regarding treatment outcomes. I acknowledge that a copy of this consent can be made available to me via my patient portal ([place]), or I can request a printed copy by calling the office  of [facility].    I understand that my insurance will be billed for this visit.   I have read or had this consent read to me. . I understand the contents of this consent, which adequately explains the benefits and risks of the Services being provided via telemedicine.  . I have been provided ample opportunity to ask questions regarding this consent and the Services and have had my questions answered to my satisfaction. . I give my informed consent for the services to be provided through the use of telemedicine in my medical care

## 2019-06-08 NOTE — Progress Notes (Signed)
06/08/2019 Betty Jackson [id number] 12-01-1934   CHIEF COMPLAINT:  C. Diff follow up  HISTORY OF PRESENT ILLNESS:  Betty Jackson is an 85 year old female with a complex past medical history of anxiety, depression, atrial fibrillation on Eliquis, CAD s/p RCA in 2006, viral pericarditis with a pericardial effusion  s/p pericardial window 02/2019, questionable CVA,  temporal arteritis 2011,  CKD stage III, rheumatoid arthritis, migraine headaches, pelvic fracture, osteoporosis, chronic back pain post L2-3 and L3-4 posterior fusion in 2015 and an L5-S1 microdiscectomy in 2017, GERD and diverticulitis 12/2018. She presents today accompanied by her daughter for further evaluation regarding abdominal pain and C. Diff colitis.   She presented to [facility] ED on 01/12/2019 with N/V and abdominal pain. WBC 11.9. CTAP showed acute sigmoid diverticulitis. She was prescribed Zofran, Cipro 544m po bid and Flagyl 5054mpo bid x 10 days and discharged home.   [facility] with worsening N/V and diarrhea.  She was unable to keep down Cipro and Flagyl po. She received IV antibiotics and antiemetics and her symptoms improved. She was discharged home 10/23 on Cipro 50018mo bid and Flagyl 500m65md x 7 days for her diverticulitis.   [facility] with viral pericarditis with a pericardial effusion s/p pericardial window 12/09. CT surgery did not think she had tamponade. She was discharged home 03/13/2019.   [facility] with altered mental status and she vomited in her bed during the night as assessed by her daughter via home monitor. She was sent to the ED for further evaluation.  Labs in the ED showed WBC 15.9. Hg 12.8. HCT 41.1. PLT 204. K+ 2.6. T. Bili 2.5. Alk phos 101. AST 46. ALT 36.  Lactic acid 2.6. Troponin 88. She was assessed to have sepsis, source unclear. She received empiric IV Cefepime and Zithromax. A chest CTA and  abd/pelvic CT showed mild intrahepatic biliary and CBD dilatation (past cholecystectomy) and her prior small nondisplaced fracture involving the left ischium now appears to be an aggressive lytic process with marked periostitis. Marked lucency of the L2, L3 and L4 vertebral bodies and possibly T12. These findings were concerning for metastatic disease. She was seen by orthopedic and neurosurgery, surgical intervention deferred due to severe osteoporosis. MRI of the thoracic and lumbar spine without  evidence malignancy/metastatic disease. Physical therapy recommended. Troponin levels drifted downward and she was advised to follow up with her cardiologist. She was discharged home on 04/21/2019.   She developed dizziness/near syncope and presented to [facility] 05/25/[facility]. WBC 12.1. Hg 10.9. Cr. 1.18. EKG without ischemic changes. She was discharged home.   She developed a fever 102F with persistent dizziness and she went back to [facility] 05/28/2019.  In the ED she was tachycardic. WBC 20.3. Lactic acid  2.4. INR 1.4. SARS Covid 19 negative (she received Covid vaccine x 2). She was admitted with sepsis. Chest x-ray showing mild increased central vascular congestion new from prior exam.  No interstitial edema noted.  CT abdomen pelvis showed sigmoid diverticulosis with mild perisigmoid stranding thought to be likely chronic.  Mild acute diverticulitis favored to be less likely. No colitis. No evidence of bowel obstruction. She received Tylenol, IV fluids, Flagyl, Vancomycin and Cefepime. C.diff PCR was positive in setting of loose stools. Vancomycin was changed to po and she was discharged home on 3/4 to complete 10 days of Vancomycin  164m 1 po qid. She presents today for further GI follow up. She took her last dose of Vancomycin 06/08/2019. She is taking a probiotic once daily. She is passing loose "clumps of stool" once or twice daily. She has mild RLQ soreness, no significant pain. Prior to having C. Diff infection,  she reported rarely passes a solid stool. No rectal bleeding. She often feels an urge to defecate but no stool comes out, she strains then vomits undigested food which occurs once monthly for the past 6 months. No hematemesis. No heartburn. No current abdominal pain. No melena. She is taking Famotidine 281mQD.   Abdominal/pelvic CT 05/28/2019:  1. Sigmoid diverticulosis. Mild perisigmoid stranding, likely chronic. Mild acute diverticulitis is favored less likely but not excluded. Clinical correlation is recommended. No bowel obstruction. Normal appendix. 2. Additional nonacute findings as above and similar to prior CT including Aortic Atherosclerosis    Chest CT angiogram and abd/pelvic CT with contrast 04/15/2019: 1. No CT findings for pulmonary embolism. 2. No aortic aneurysm or dissection. 3. Persistent small pericardial effusion and prominent pericardial fat. 4. Mosaic pattern of ground-glass attenuation throughout the lungs most likely small airways disease. Please see above discussion. 5. Small right pleural effusion and overlying atelectasis. 6. No acute abdominal/pelvic findings, mass lesions or adenopathy. There is mild intra and extrahepatic biliary dilatation likely due to prior cholecystectomy. The stomach, duodenum, small bowel and colon are grossly normal. No acute inflammatory changes, mass lesions or obstructive findings. The terminal ileum and appendix are normal. Descending colon and sigmoid colon diverticulosis but no findings for acute diverticulitis 7. Prior small nondisplaced fracture involving the left ischium now appears to be an aggressive lytic process with marked periostitis. There is also marked lucency of the L2, L3 and L4 vertebral bodies and possibly T12. Could not exclude metastatic disease.  Abdominal/pelvic CT 01/12/2019:  1. Findings are consistent with acute sigmoid diverticulitis. No evidence for perforation or abscess. 2. Status post  cholecystectomy and hysterectomy. 3. Small hiatal hernia. 4. Small fat containing paraumbilical hernia. 5. Postoperative and degenerative changes in the lumbar spine. 6. Aortic Atherosclerosis   ECHO 04/17/2019: 1. Left ventricular ejection fraction, by visual estimation, is 60 to 65%. The left ventricle has normal function. There is mildly increased left ventricular hypertrophy. 2. Left ventricular diastolic parameters are consistent with Grade II diastolic dysfunction (pseudonormalization). The mean left atrial pressure is high. 3. The left ventricle has no regional wall motion abnormalities. 4. Global right ventricle has normal systolic function.The right ventricular size is normal. No increase in right ventricular wall thickness.  EGD 01/26/12: Small hiatal hernia otherwise normal EGD.  Colonoscopy 01/26/2012 Dr. StFuller Plan Mild diverticulosis descending and sigmoid colon. Internal and external hemorrhoids.   Past Medical History:  Diagnosis Date  . Acute respiratory failure with hypoxia ([place]  . Anemia, unspecified 10/28/2012  . Anxiety state, unspecified 10/28/2012  . CAD (coronary artery disease)    [facility] - nonobs per pt report.  . Cataract    immature on the left eye  . Chronic insomnia 02/07/2013  . Chronic lower back pain    scoliosis  . CKD (chronic kidney disease) stage 3, GFR 30-59 ml/min   . Complication of anesthesia    pt has a very high tolerance to meds  . CVA (cerebral infarction) 10/29/2012  . DDD (degenerative disc disease)   . Depression   . Diverticulitis    hx of  .  Diverticulosis   . Enteritis   . GERD (gastroesophageal reflux disease) 09/01/2010  . Giant cell arteritis ([place])   . Hemorrhoids   . Herniated nucleus pulposus, L5-S1, right 11/04/2015  . History of scabies   . HTN (hypertension) 05/20/2017  . Hyperlipidemia    takes Lipitor daily  . Hypertension    takes  Amlodipine,Losartan,Metoprolol,and HCTZ daily  . Incontinence of urine   . Insomnia    takes Restoril nightly  . Lumbar stenosis 04/25/2013  . Major depressive disorder, recurrent episode, moderate ([place]) 07/16/2013  . Migraines    "back in my 20's; none since" (10/28/2012)  . Osteoporosis   . PAF (paroxysmal atrial fibrillation) ([place]) 2011   a. lone epidode in 2011 according to notes.  . Rheumatoid arthritis ([place])   . Scoliosis   . Sepsis ([place]) 05/2019  . Sinus bradycardia    a. on chronic bb  . Temporal arteritis ([place]) 2011   a. followed @ [facility]; potential flareup 10/28/2012/notes 10/28/2012  . Vocal cord dysfunction    "they don't operate properly" (10/28/2012)   Past Surgical History:  Procedure Laterality Date  . ABDOMINAL HYSTERECTOMY  ~ 1984   vaginally  . BACK SURGERY  7-57yr ago   X Stop  . BLADDER SUSPENSION  2001  . BREAST BIOPSY Right   . CATARACT EXTRACTION W/ INTRAOCULAR LENS IMPLANT Right ~ 08/2012  . CHEST TUBE INSERTION Left 03/08/2019   Procedure: Chest Tube Insertion;  Surgeon: VIvin Poot MD;  [place] [facility]  Service: Thoracic;  Laterality: Left;  . COLONOSCOPY  01/26/2012   Procedure: COLONOSCOPY;  Surgeon: MLadene Artist MD,FACG;  [facility];  Service: Endoscopy;  Laterality: N/A;  note the EGD is possible  . CORONARY ANGIOPLASTY WITH STENT PLACEMENT  2006   X 1 stent  . EPIDURAL BLOCK INJECTION    . ESOPHAGOGASTRODUODENOSCOPY  01/26/2012   Procedure: ESOPHAGOGASTRODUODENOSCOPY (EGD);  Surgeon: MLadene Artist MD,FACG;  [facility];  Service: Endoscopy;  Laterality: N/A;  . HEMIARTHROPLASTY HIP Right 2012  . IR EPIDUROGRAPHY  04/20/2019  . LAPAROSCOPIC CHOLECYSTECTOMY  2001  . LUMBAR FUSION  03/2013  . LUMBAR LAMINECTOMY/DECOMPRESSION MICRODISCECTOMY Right 11/04/2015   Procedure: Right Lumbar Five-Sacral One Microdiskectomy;  Surgeon: HKristeen Miss MD;  [facility];  Service: Neurosurgery;  Laterality: Right;  Right L5-S1  Microdiskectomy  . moles removed that required stiches     one on leg and one on face  . SUBXYPHOID PERICARDIAL WINDOW N/A 03/08/2019   Procedure: SUBXYPHOID PERICARDIAL WINDOW;  Surgeon: VIvin Poot MD;  [place]  Service: Thoracic;  Laterality: N/A;  . TEE WITHOUT CARDIOVERSION N/A 03/08/2019   Procedure: TRANSESOPHAGEAL ECHOCARDIOGRAM (TEE);  Surgeon: VPrescott Gum PCollier Salina MD;  [place] [facility]  Service: Thoracic;  Laterality: N/A;  . TEMPORAL ARTERY BIOPSY / LIGATION Bilateral 2011  . TONSILLECTOMY AND ADENOIDECTOMY     at age 85 . TUBAL LIGATION  ~ 1982  . X-STOP IMPLANTATION  ~ 2010   "lower back" (10/28/2012)     reports that she has never smoked. She has never used smokeless tobacco. She reports current alcohol use. She reports that she does not use drugs. family history includes Brain cancer (age of onset: 567 in her mother; Breast cancer (age of onset: 489 in her daughter; Heart attack in her father; Heart disease in an other family member; Hypertension in an other family member. Allergies  Allergen Reactions  . Dextromethorphan Rash  Outpatient Encounter Medications as of 06/09/2019  Medication Sig  . alendronate (FOSAMAX) 70 MG tablet Take 1 tablet (70 mg total) by mouth every 7 (seven) days. Take with a full glass of water on an empty stomach.  . Cholecalciferol (VITAMIN D-3) 25 MCG (1000 UT) CAPS Take 1,000 Units by mouth daily.  Marland Kitchen diltiazem (CARDIZEM CD) 180 MG 24 hr capsule Take 1 capsule (180 mg total) by mouth daily.  Marland Kitchen ELIQUIS 5 MG TABS tablet TAKE 1 TABLET BY MOUTH TWICE A DAY  . escitalopram (LEXAPRO) 10 MG tablet TAKE 1 TABLET BY MOUTH EVERY DAY  . famotidine (PEPCID) 20 MG tablet Take 1 tablet (20 mg total) by mouth daily.  . ferrous sulfate 325 (65 FE) MG tablet Take 1 tablet (325 mg total) by mouth 2 (two) times daily with a meal.  . furosemide (LASIX) 40 MG tablet Take 1 tablet (40 mg total) by mouth daily.  Marland Kitchen HYDROcodone-acetaminophen  (NORCO/VICODIN) 5-325 MG tablet Take one tablet by mouth every 8 hours as needed for pain. May refill in two months.  . lactobacillus acidophilus (BACID) TABS tablet Take 2 tablets by mouth 3 (three) times daily.  Marland Kitchen losartan (COZAAR) 100 MG tablet Take 1 tablet (100 mg total) by mouth daily.  . metoprolol tartrate (LOPRESSOR) 100 MG tablet Take 1 tablet (100 mg total) by mouth 2 (two) times daily. Please schedule an appt for further refills 1st attempt  . ondansetron (ZOFRAN ODT) 4 MG disintegrating tablet Take 1 tablet (4 mg total) by mouth every 8 (eight) hours as needed for nausea or vomiting.  . potassium chloride SA (KLOR-CON) 10 MEQ tablet Take 1 tablet (10 mEq total) by mouth daily.  . predniSONE (DELTASONE) 10 MG tablet Take 1 tablet (10 mg total) by mouth daily.  . vancomycin (VANCOCIN) 125 MG capsule Take 1 capsule (125 mg total) by mouth 4 (four) times daily for 7 days. Continue through 06/07/2019  . vitamin C (ASCORBIC ACID) 500 MG tablet Take 1,000 mg by mouth daily.   . vitamin E (VITAMIN E) 400 UNIT capsule Take 400 Units by mouth daily.   No facility-administered encounter medications on file as of 06/09/2019.     REVIEW OF SYSTEMS: All other systems reviewed and negative except where noted in the History of Present Illness.   PHYSICAL EXAM: BP 112/62   Pulse 76   Temp 98.7 F (37.1 C)   Ht '5\' 4"'  (1.626 m)   Wt 149 lb 9.6 oz (67.9 kg)   SpO2 96%   BMI 25.68 kg/m  General: 85 year old female in no acute distress. Head: Normocephalic and atraumatic. Eyes:  Sclerae non-icteric, conjunctive pink. Ears: Normal auditory acuity. Mouth: Dentition intact. No ulcers or lesions.  Neck: Supple, no lymphadenopathy or thyromegaly.  Lungs: Clear bilaterally to auscultation without wheezes, crackles or rhonchi. Heart: Rhythm slightly irregular.  Systolic murmur. No rub or gallop appreciated.  Abdomen: Soft, nondistended. Mild RLQ tenderness without rebound or guarding. No masses. No  hepatosplenomegaly. Normoactive bowel sounds x 4 quadrants.  Rectal: Deferred.  Musculoskeletal: Symmetrical with no gross deformities. Skin: Warm and dry. No rash or lesions on visible extremities. Extremities: Mild edema ankles and feet bilaterally.  Neurological: Alert oriented x 4, no focal deficits.  Psychological:  Alert and cooperative. Normal mood and affect.  ASSESSMENT AND PLAN:  67. 85 year old female with a complex medical history presents today for C. Diff colitis with sepsis follow up.  -Continue Vancomycin taper to reduce the risk  of C. Diff spore repopulation/relapse.   She finished Vanco 125m 1 po qid for 10 days, last dose was yesterday.  Today start Vancomycin 1253m1 po tid x 7 days then 12567m po  Bid x 7 days then 1 po bid x 7 days. -Florastor probiotic 1 po bid x 4 weeks -Diet as tolerated -Patient to call our office if she develops frequent loose stools, worsening abdominal pain or fever -Follow up with Dr. StaSilvio Pate 3 weeks to determine if a diagnostic colonoscopy to be done due acute sigmoid diverticulitis 12/2018 with subsequent CT imaging showing chronic sigmoid  inflammation, rule out malignant process  2. Normocytic anemia on Ferrous Sulfate 325m32m. No obvious GI bleeding. Hg 9.4. HCT 30. MCV 91.7.  -CBC, iron panel   3. Afib on Eliquis and Diltiazem.  4. Pericarditis/pericardial effusion s/p pericardial window 02/2019  5. CAD/stent  6. Rheumatoid arthritis on Prednisone     Consult included 50 minutes including review of extensive hospital admission records,  obtaining the patient's H & P and discussion the plan with the patient and her daughter.   CC:  Burc[place] Post

## 2019-06-09 ENCOUNTER — Other Ambulatory Visit: Payer: Medicare Other

## 2019-06-09 ENCOUNTER — Other Ambulatory Visit: Payer: Self-pay | Admitting: Cardiovascular Disease

## 2019-06-09 ENCOUNTER — Encounter: Payer: Self-pay | Admitting: *Deleted

## 2019-06-09 ENCOUNTER — Ambulatory Visit: Payer: Medicare Other | Admitting: Nurse Practitioner

## 2019-06-09 ENCOUNTER — Other Ambulatory Visit: Payer: Self-pay | Admitting: *Deleted

## 2019-06-09 ENCOUNTER — Other Ambulatory Visit (HOSPITAL_COMMUNITY): Payer: Self-pay | Admitting: Physician Assistant

## 2019-06-09 ENCOUNTER — Other Ambulatory Visit (INDEPENDENT_AMBULATORY_CARE_PROVIDER_SITE_OTHER): Payer: Medicare Other

## 2019-06-09 ENCOUNTER — Encounter: Payer: Self-pay | Admitting: Nurse Practitioner

## 2019-06-09 VITALS — BP 112/62 | HR 76 | Temp 98.7°F | Ht 64.0 in | Wt 149.6 lb

## 2019-06-09 DIAGNOSIS — D631 Anemia in chronic kidney disease: Secondary | ICD-10-CM | POA: Diagnosis not present

## 2019-06-09 DIAGNOSIS — D508 Other iron deficiency anemias: Secondary | ICD-10-CM | POA: Diagnosis not present

## 2019-06-09 DIAGNOSIS — A0472 Enterocolitis due to Clostridium difficile, not specified as recurrent: Secondary | ICD-10-CM | POA: Diagnosis not present

## 2019-06-09 DIAGNOSIS — K5732 Diverticulitis of large intestine without perforation or abscess without bleeding: Secondary | ICD-10-CM

## 2019-06-09 DIAGNOSIS — N183 Chronic kidney disease, stage 3 unspecified: Secondary | ICD-10-CM | POA: Diagnosis not present

## 2019-06-09 DIAGNOSIS — M80052D Age-related osteoporosis with current pathological fracture, left femur, subsequent encounter for fracture with routine healing: Secondary | ICD-10-CM | POA: Diagnosis not present

## 2019-06-09 DIAGNOSIS — I5032 Chronic diastolic (congestive) heart failure: Secondary | ICD-10-CM | POA: Diagnosis not present

## 2019-06-09 DIAGNOSIS — I2510 Atherosclerotic heart disease of native coronary artery without angina pectoris: Secondary | ICD-10-CM | POA: Diagnosis not present

## 2019-06-09 DIAGNOSIS — H269 Unspecified cataract: Secondary | ICD-10-CM | POA: Diagnosis not present

## 2019-06-09 DIAGNOSIS — K5790 Diverticulosis of intestine, part unspecified, without perforation or abscess without bleeding: Secondary | ICD-10-CM | POA: Diagnosis not present

## 2019-06-09 DIAGNOSIS — I130 Hypertensive heart and chronic kidney disease with heart failure and stage 1 through stage 4 chronic kidney disease, or unspecified chronic kidney disease: Secondary | ICD-10-CM | POA: Diagnosis not present

## 2019-06-09 DIAGNOSIS — A419 Sepsis, unspecified organism: Secondary | ICD-10-CM | POA: Diagnosis not present

## 2019-06-09 DIAGNOSIS — M316 Other giant cell arteritis: Secondary | ICD-10-CM | POA: Diagnosis not present

## 2019-06-09 DIAGNOSIS — I480 Paroxysmal atrial fibrillation: Secondary | ICD-10-CM | POA: Diagnosis not present

## 2019-06-09 LAB — CBC WITH DIFFERENTIAL/PLATELET
Basophils Absolute: 0.1 10*3/uL (ref 0.0–0.1)
Basophils Relative: 1.1 % (ref 0.0–3.0)
Eosinophils Absolute: 0 10*3/uL (ref 0.0–0.7)
Eosinophils Relative: 0.2 % (ref 0.0–5.0)
HCT: 33.9 % — ABNORMAL LOW (ref 36.0–46.0)
Hemoglobin: 11.1 g/dL — ABNORMAL LOW (ref 12.0–15.0)
Lymphocytes Relative: 19.2 % (ref 12.0–46.0)
Lymphs Abs: 2 10*3/uL (ref 0.7–4.0)
MCHC: 32.8 g/dL (ref 30.0–36.0)
MCV: 88.5 fl (ref 78.0–100.0)
Monocytes Absolute: 0.8 10*3/uL (ref 0.1–1.0)
Monocytes Relative: 8 % (ref 3.0–12.0)
Neutro Abs: 7.4 10*3/uL (ref 1.4–7.7)
Neutrophils Relative %: 71.5 % (ref 43.0–77.0)
Platelets: 405 10*3/uL — ABNORMAL HIGH (ref 150.0–400.0)
RBC: 3.83 Mil/uL — ABNORMAL LOW (ref 3.87–5.11)
RDW: 15.9 % — ABNORMAL HIGH (ref 11.5–15.5)
WBC: 10.4 10*3/uL (ref 4.0–10.5)

## 2019-06-09 LAB — COMPREHENSIVE METABOLIC PANEL
ALT: 14 U/L (ref 0–35)
AST: 16 U/L (ref 0–37)
Albumin: 3.5 g/dL (ref 3.5–5.2)
Alkaline Phosphatase: 85 U/L (ref 39–117)
BUN: 17 mg/dL (ref 6–23)
CO2: 31 mEq/L (ref 19–32)
Calcium: 9.3 mg/dL (ref 8.4–10.5)
Chloride: 103 mEq/L (ref 96–112)
Creatinine, Ser: 1.01 mg/dL (ref 0.40–1.20)
GFR: 52.36 mL/min — ABNORMAL LOW (ref >60.00)
Glucose, Bld: 92 mg/dL (ref 70–99)
Potassium: 3.5 mEq/L (ref 3.5–5.1)
Sodium: 140 mEq/L (ref 135–145)
Total Bilirubin: 0.3 mg/dL (ref 0.2–1.2)
Total Protein: 6.9 g/dL (ref 6.0–8.3)

## 2019-06-09 LAB — IBC PANEL
Iron: 64 ug/dL (ref 42–145)
Saturation Ratios: 23.4 % (ref 20.0–50.0)
Transferrin: 195 mg/dL — ABNORMAL LOW (ref 212.0–360.0)

## 2019-06-09 LAB — FERRITIN: Ferritin: 166.7 ng/mL (ref 10.0–291.0)

## 2019-06-09 MED ORDER — VANCOMYCIN HCL 125 MG PO CAPS: ORAL_CAPSULE | ORAL | 0 refills | Status: DC

## 2019-06-09 NOTE — Patient Outreach (Signed)
[facility]) Care Management  06/09/2019  Betty Jackson 01-06-1935 [id number]  CSW received a new referral on patient from patient's [name], also with [place] Management, Raina Mina, indicating that patient would benefit from social work services and resources to assist with obtaining [employer], through [employer] [facility] [place], as well as arranging in-home care services for patient.  Ms. Zigmund Daniel encouraged CSW to contact patient's daughter, Betty Jackson [phone]), as patient has a voice disorder and unable to converse with CSW over the phone.  CSW made an attempt to try and contact patient's daughter, Betty Jackson today to perform the initial phone assessment, as well as assess and assist with social work needs and services; however, Mrs. Jackson was unavailable at the time of CSW's call.  CSW was unable to leave a HIPAA compliant message on voicemail for Mrs. Jackson, receiving an automated recording that her mailbox is full and unable to accept new messages at this time.  CSW will continue to try and contact Mrs. Jackson throughout the day today.  If CSW is unsuccessful in getting in contact with Mrs. Jackson today, CSW will make a second outreach attempt within the next 3-4 business days, on Thursday, June 15, 2019, if a return call is not received from Mrs. Jackson in the meantime.  CSW will also mail an Unsuccessful Outreach Letter to patient's home, encouraging patient or Mrs. Jackson to contact CSW directly if they are interested in receiving social work services through [facility].  CSW will communicate unsuccessful outreach attempt to Ms. Zigmund Daniel, encouraging her to have Mrs. Jackson contact CSW if she is able to make phone contact.  Nat Christen, BSW, MSW, LCSW  Licensed Education officer, [facility]  Mailing [place] [address] [place], [place] [address] Physical Address-[address], [place], [place] [address] Toll Free Main # [phone] Fax # [phone] Cell # [phone]  Office # [phone] [email or web address]

## 2019-06-09 NOTE — Patient Instructions (Signed)
If you are age 83 or older, your body mass index should be between 23-30. Your Body mass index is 25.68 kg/m. If this is out of the aforementioned range listed, please consider follow up with your Primary Care Provider.  If you are age 43 or younger, your body mass index should be between 19-25. Your Body mass index is 25.68 kg/m. If this is out of the aformentioned range listed, please consider follow up with your Primary Care Provider.   Your provider has requested that you go to the basement level for lab work before leaving today. Press "B" on the elevator. The lab is located at the first door on the left as you exit the elevator.  We have sent the following medications to your pharmacy for you to pick up at your convenience:  Vancomycin 125 mg 1 by mouth  Three times a day for 7 days Vancomycin 125 mg 1 by mouth  Two times a day for 7 days Vancomycin 125 mg 1 by mouth 1 time daily for 7 days.  Please purchase the following medications over the counter and take as directed:  Florastor probiotic 1 by mouth twice a day for 4 weeks.

## 2019-06-10 ENCOUNTER — Telehealth: Payer: Self-pay | Admitting: Nurse Practitioner

## 2019-06-10 DIAGNOSIS — I5032 Chronic diastolic (congestive) heart failure: Secondary | ICD-10-CM | POA: Diagnosis not present

## 2019-06-10 DIAGNOSIS — M316 Other giant cell arteritis: Secondary | ICD-10-CM | POA: Diagnosis not present

## 2019-06-10 DIAGNOSIS — A0472 Enterocolitis due to Clostridium difficile, not specified as recurrent: Secondary | ICD-10-CM | POA: Diagnosis not present

## 2019-06-10 DIAGNOSIS — D631 Anemia in chronic kidney disease: Secondary | ICD-10-CM | POA: Diagnosis not present

## 2019-06-10 DIAGNOSIS — N183 Chronic kidney disease, stage 3 unspecified: Secondary | ICD-10-CM | POA: Diagnosis not present

## 2019-06-10 DIAGNOSIS — I130 Hypertensive heart and chronic kidney disease with heart failure and stage 1 through stage 4 chronic kidney disease, or unspecified chronic kidney disease: Secondary | ICD-10-CM | POA: Diagnosis not present

## 2019-06-10 DIAGNOSIS — H269 Unspecified cataract: Secondary | ICD-10-CM | POA: Diagnosis not present

## 2019-06-10 DIAGNOSIS — A419 Sepsis, unspecified organism: Secondary | ICD-10-CM | POA: Diagnosis not present

## 2019-06-10 DIAGNOSIS — I480 Paroxysmal atrial fibrillation: Secondary | ICD-10-CM | POA: Diagnosis not present

## 2019-06-10 DIAGNOSIS — M80052D Age-related osteoporosis with current pathological fracture, left femur, subsequent encounter for fracture with routine healing: Secondary | ICD-10-CM | POA: Diagnosis not present

## 2019-06-10 DIAGNOSIS — I2510 Atherosclerotic heart disease of native coronary artery without angina pectoris: Secondary | ICD-10-CM | POA: Diagnosis not present

## 2019-06-10 DIAGNOSIS — K5790 Diverticulosis of intestine, part unspecified, without perforation or abscess without bleeding: Secondary | ICD-10-CM | POA: Diagnosis not present

## 2019-06-10 NOTE — Telephone Encounter (Signed)
Bre, pls contact pt/daughter and change her follow up appointment which should be with Dr. Fuller Plan and not Dr. Tarri Glenn (this was my error) thx

## 2019-06-12 ENCOUNTER — Telehealth: Payer: Self-pay | Admitting: Family Medicine

## 2019-06-12 DIAGNOSIS — H269 Unspecified cataract: Secondary | ICD-10-CM | POA: Diagnosis not present

## 2019-06-12 DIAGNOSIS — N183 Chronic kidney disease, stage 3 unspecified: Secondary | ICD-10-CM | POA: Diagnosis not present

## 2019-06-12 DIAGNOSIS — I2510 Atherosclerotic heart disease of native coronary artery without angina pectoris: Secondary | ICD-10-CM | POA: Diagnosis not present

## 2019-06-12 DIAGNOSIS — M316 Other giant cell arteritis: Secondary | ICD-10-CM | POA: Diagnosis not present

## 2019-06-12 DIAGNOSIS — I130 Hypertensive heart and chronic kidney disease with heart failure and stage 1 through stage 4 chronic kidney disease, or unspecified chronic kidney disease: Secondary | ICD-10-CM | POA: Diagnosis not present

## 2019-06-12 DIAGNOSIS — A419 Sepsis, unspecified organism: Secondary | ICD-10-CM | POA: Diagnosis not present

## 2019-06-12 DIAGNOSIS — I5032 Chronic diastolic (congestive) heart failure: Secondary | ICD-10-CM | POA: Diagnosis not present

## 2019-06-12 DIAGNOSIS — A0472 Enterocolitis due to Clostridium difficile, not specified as recurrent: Secondary | ICD-10-CM | POA: Diagnosis not present

## 2019-06-12 DIAGNOSIS — I480 Paroxysmal atrial fibrillation: Secondary | ICD-10-CM | POA: Diagnosis not present

## 2019-06-12 DIAGNOSIS — D631 Anemia in chronic kidney disease: Secondary | ICD-10-CM | POA: Diagnosis not present

## 2019-06-12 DIAGNOSIS — K5790 Diverticulosis of intestine, part unspecified, without perforation or abscess without bleeding: Secondary | ICD-10-CM | POA: Diagnosis not present

## 2019-06-12 DIAGNOSIS — M80052D Age-related osteoporosis with current pathological fracture, left femur, subsequent encounter for fracture with routine healing: Secondary | ICD-10-CM | POA: Diagnosis not present

## 2019-06-12 NOTE — Progress Notes (Signed)
Reviewed and agree with management plan.  Titianna Loomis T. Avalynne Diver, MD FACG [facility]  

## 2019-06-12 NOTE — Telephone Encounter (Signed)
Called and spoke with patient's daughter-Melissa- verified DPR-Melissa has rescheduled the patient with Dr. Fuller Plan on 07/17/19 at 3:40 pm; Melissa advised to call back to the office at [phone] should questions/concerns arise;  Melissa verbalized understanding of information/instructions;

## 2019-06-12 NOTE — Telephone Encounter (Signed)
Betty Jackson from Hospice has faxed forms over to be signed and faxed back.  The patient and daughter Betty Jackson-- ph # [phone]) are both aware that the forms were sent and need to be signed.  She is requesting a call back to find out if the forms have been faxed back to her yet.  Kandice Moos- [id number]

## 2019-06-12 NOTE — Telephone Encounter (Signed)
Left detailed message that this has been faxed back

## 2019-06-13 ENCOUNTER — Ambulatory Visit: Payer: Medicare Other | Admitting: *Deleted

## 2019-06-13 ENCOUNTER — Other Ambulatory Visit: Payer: Self-pay

## 2019-06-13 ENCOUNTER — Emergency Department (HOSPITAL_COMMUNITY)
Admission: EM | Admit: 2019-06-13 | Discharge: 2019-06-13 | Disposition: A | Discharge status: Home / Self Care | Payer: Medicare Other | Attending: Emergency Medicine | Admitting: Emergency Medicine

## 2019-06-13 ENCOUNTER — Encounter (HOSPITAL_COMMUNITY): Payer: Self-pay | Admitting: Emergency Medicine

## 2019-06-13 DIAGNOSIS — N183 Chronic kidney disease, stage 3 unspecified: Secondary | ICD-10-CM | POA: Diagnosis not present

## 2019-06-13 DIAGNOSIS — M545 Low back pain: Secondary | ICD-10-CM | POA: Diagnosis present

## 2019-06-13 DIAGNOSIS — Z7901 Long term (current) use of anticoagulants: Secondary | ICD-10-CM | POA: Insufficient documentation

## 2019-06-13 DIAGNOSIS — I480 Paroxysmal atrial fibrillation: Secondary | ICD-10-CM | POA: Diagnosis not present

## 2019-06-13 DIAGNOSIS — M5441 Lumbago with sciatica, right side: Secondary | ICD-10-CM | POA: Insufficient documentation

## 2019-06-13 DIAGNOSIS — Z79899 Other long term (current) drug therapy: Secondary | ICD-10-CM | POA: Insufficient documentation

## 2019-06-13 DIAGNOSIS — Z7952 Long term (current) use of systemic steroids: Secondary | ICD-10-CM | POA: Insufficient documentation

## 2019-06-13 DIAGNOSIS — M069 Rheumatoid arthritis, unspecified: Secondary | ICD-10-CM | POA: Insufficient documentation

## 2019-06-13 DIAGNOSIS — I2510 Atherosclerotic heart disease of native coronary artery without angina pectoris: Secondary | ICD-10-CM | POA: Insufficient documentation

## 2019-06-13 DIAGNOSIS — I129 Hypertensive chronic kidney disease with stage 1 through stage 4 chronic kidney disease, or unspecified chronic kidney disease: Secondary | ICD-10-CM | POA: Insufficient documentation

## 2019-06-13 LAB — COMPREHENSIVE METABOLIC PANEL
ALT: 28 U/L (ref 0–44)
AST: 34 U/L (ref 15–41)
Albumin: 3.3 g/dL — ABNORMAL LOW (ref 3.5–5.0)
Alkaline Phosphatase: 86 U/L (ref 38–126)
Anion gap: 14 (ref 5–15)
BUN: 13 mg/dL (ref 8–23)
CO2: 22 mmol/L (ref 22–32)
Calcium: 9 mg/dL (ref 8.9–10.3)
Chloride: 104 mmol/L (ref 98–111)
Creatinine, Ser: 0.99 mg/dL (ref 0.44–1.00)
GFR calc Af Amer: >60 mL/min (ref >60)
GFR calc non Af Amer: 53 mL/min — ABNORMAL LOW (ref >60)
Glucose, Bld: 193 mg/dL — ABNORMAL HIGH (ref 70–99)
Potassium: 4.8 mmol/L (ref 3.5–5.1)
Sodium: 140 mmol/L (ref 135–145)
Total Bilirubin: 0.9 mg/dL (ref 0.3–1.2)
Total Protein: 6.5 g/dL (ref 6.5–8.1)

## 2019-06-13 LAB — CBC
HCT: 39.2 % (ref 36.0–46.0)
HCT: 39.2 % (ref 36.0–46.0)
Hemoglobin: 12 g/dL (ref 12.0–15.0)
Hemoglobin: 11.9 g/dL — ABNORMAL LOW (ref 12.0–15.0)
MCH: 28.9 pg (ref 26.0–34.0)
MCH: 29.2 pg (ref 26.0–34.0)
MCHC: 30.6 g/dL (ref 30.0–36.0)
MCHC: 30.4 g/dL (ref 30.0–36.0)
MCV: 94.5 fL (ref 80.0–100.0)
MCV: 96.1 fL (ref 80.0–100.0)
Platelets: 361 10*3/uL (ref 150–400)
Platelets: 371 10*3/uL (ref 150–400)
RBC: 4.15 MIL/uL (ref 3.87–5.11)
RBC: 4.08 MIL/uL (ref 3.87–5.11)
RDW: 15.1 % (ref 11.5–15.5)
RDW: 15.3 % (ref 11.5–15.5)
WBC: 13.7 10*3/uL — ABNORMAL HIGH (ref 4.0–10.5)
WBC: 13.4 10*3/uL — ABNORMAL HIGH (ref 4.0–10.5)
nRBC: 0.1 % (ref 0.0–0.2)
nRBC: 0.1 % (ref 0.0–0.2)

## 2019-06-13 LAB — DIFFERENTIAL
Abs Immature Granulocytes: 0.11 10*3/uL — ABNORMAL HIGH (ref 0.00–0.07)
Basophils Absolute: 0.1 10*3/uL (ref 0.0–0.1)
Basophils Relative: 1 %
Eosinophils Absolute: 0 10*3/uL (ref 0.0–0.5)
Eosinophils Relative: 0 %
Immature Granulocytes: 1 %
Lymphocytes Relative: 9 %
Lymphs Abs: 1.2 10*3/uL (ref 0.7–4.0)
Monocytes Absolute: 0.3 10*3/uL (ref 0.1–1.0)
Monocytes Relative: 3 %
Neutro Abs: 11.7 10*3/uL — ABNORMAL HIGH (ref 1.7–7.7)
Neutrophils Relative %: 86 %

## 2019-06-13 MED ORDER — FENTANYL CITRATE (PF) 100 MCG/2ML IJ SOLN
50.0000 ug | Freq: Once | INTRAMUSCULAR | Status: AC
  Administered 2019-06-13: 50 ug via INTRAMUSCULAR
  Filled 2019-06-13: qty 2

## 2019-06-13 NOTE — Social Work (Signed)
EDCSW reached out to Pts daughter, [name] via phone [phone] to encourage daughter to contact Marcella Dubs, case Freight forwarder at [facility]. Per Pt chart Ms. Soporsito has been trying to make contact with daughter in an effort to coordinate care. CSW was able to speak with daughter and encourage her to contact [facility] for further coordination. CSW emailed [facility] case manager to update as to Pt ED encounter.

## 2019-06-13 NOTE — Discharge Instructions (Addendum)
Please follow-up with your spine surgeon as well as your pain management clinic regarding your ER visit today.  Discharge Information - Low Back Pain  You were seen in the emergency department (ED) today for low back pain.  You should continue your activities as tolerated, and avoid prolonged periods of bed rest.    There are many causes of low back pain. Most of the time, the pain is caused by conditions such as a muscle strain, inflammation or a bulging disc that cannot be identified on an X-ray or CT scan. This kind of diagnostic imaging does not accurately identify the cause of most low back pain, and often exposes patients to unnecessary radiation and health-care costs.  Your ED provider today has determined that you do not need an emergent MRI. This does not mean that you may not require an MRI as an outpatient in the future if your pain persists, or if you develop additional neurologic symptoms.  It is extremely important for you to follow up with your outpatient provider for further examination and discussion regarding treatment and imaging, if necessary.  Please follow up with your doctor within 1 week .  If you develop any of the following symptoms, return to the ED immediately for re-evaluation. This may be signs of new or worsening injury to your spinal cord, which may require further imaging or surgical intervention.  Significant trauma or fall, especially if you are over age 75 or have osteoporosis Sudden, acute onset of urinary retention or incontinence (ie. Leaking urine, or unable to urinate) Fecal incontinence (cannot control your bowels) Loss of sensation (anesthesia) in to the area of the buttocks, groin, perineum (between your groin and your anus), and inner surfaces of the thighs Weakness in the lower limbs

## 2019-06-13 NOTE — ED Triage Notes (Signed)
Pt arrives to ED from home with complaints of acute on chronic lower back pain. Patient states she is on a hydrocodone at home for pain but starting yesterday her pain is too much to handle. Patient denies any urinary problems. Patient has extensive medical history.

## 2019-06-13 NOTE — ED Provider Notes (Signed)
[facility] EMERGENCY DEPARTMENT Provider Note   CSN: [id number] Arrival date & time: 06/13/19  1420     History Chief Complaint  Patient presents with  . Back Pain    Betty Jackson is a 85 y.o. female.  HPI      Betty Jackson is a 85 y.o. female, with a history of chronic back pain, CAD, CVA, CKD stage III, presenting to the ED with acute on chronic lower back pain over the past week.  Her pain is in the right lower back, aching, moderate to severe, radiating into the back of the right leg. She has not been able to control her chronic pain with her home pain medication.   Her pain management specialist increased her hydrocodone dosing from 5 mg to 10 mg last week. She states she has been in the hospital a few times recently, most recently for diagnosis of C. difficile.  This is been giving her some abdominal pain.  This has not changed over the last couple weeks.  Denies fever/chills, acute abdominal pain, weakness, numbness, falls/trauma, changes in bladder function, saddle anesthesias, or any other complaints.  Past Medical History:  Diagnosis Date  . Acute respiratory failure with hypoxia ([place]) 05/20/2017  . Anemia, unspecified 10/28/2012  . Anxiety state, unspecified 10/28/2012  . CAD (coronary artery disease)    a. Stent RCA in [facility];  b. Cath approx 2009 - nonobs per pt report.  . Cataract    immature on the left eye  . Chronic insomnia 02/07/2013  . Chronic lower back pain    scoliosis  . CKD (chronic kidney disease) stage 3, GFR 30-59 ml/min   . Complication of anesthesia    pt has a very high tolerance to meds  . CVA (cerebral infarction) 10/29/2012  . DDD (degenerative disc disease)   . Depression   . Diverticulitis    hx of  . Diverticulosis   . Enteritis   . GERD (gastroesophageal reflux disease) 09/01/2010  . Giant cell arteritis ([place])   . Hemorrhoids   . Herniated nucleus pulposus, L5-S1, right 11/04/2015  . History of scabies   .  HTN (hypertension) 05/20/2017  . Hyperlipidemia    takes Lipitor daily  . Hypertension    takes Amlodipine,Losartan,Metoprolol,and HCTZ daily  . Incontinence of urine   . Insomnia    takes Restoril nightly  . Lumbar stenosis 04/25/2013  . Major depressive disorder, recurrent episode, moderate ([place]) 07/16/2013  . Migraines    "back in my 20's; none since" (10/28/2012)  . Osteoporosis   . PAF (paroxysmal atrial fibrillation) ([place]) 2011   a. lone epidode in 2011 according to notes.  . Rheumatoid arthritis ([place])   . Scoliosis   . Sepsis ([place]) 05/2019  . Sinus bradycardia    a. on chronic bb  . Temporal arteritis ([place]) 2011   a. followed @ [facility]; potential flareup 10/28/2012/notes 10/28/2012  . Vocal cord dysfunction    "they don't operate properly" (10/28/2012)    Patient Active Problem List   Diagnosis Date Noted  . Paroxysmal A-fib ([place])   . Hypomagnesemia   . C. difficile colitis   . Diarrhea 05/29/2019  . CHF (congestive heart failure) ([place]) 05/29/2019  . Recurrent falls 04/30/2019  . Pericardial effusion without cardiac tamponade 03/29/2019  . Hypoalbuminemia 03/27/2019  . Cardiac tamponade   . Pericarditis 03/06/2019  . Fever   . Septic shock ([place])   . Atypical chest pain   . Atrial  fibrillation with rapid ventricular response ([place])   . Nausea 01/17/2019  . Acute diverticulitis 01/13/2019  . AKI (acute kidney injury) ([place]) 01/13/2019  . Chronic pain 01/13/2019  . Tachycardia 01/13/2019  . Hypokalemia 01/13/2019  . Chronic low back pain 07/10/2018  . Influenza A 05/20/2017  . Sepsis ([place]) 05/20/2017  . Acute respiratory failure with hypoxia ([place]) 05/20/2017  . CKD (chronic kidney disease), stage III 05/20/2017  . PAF (paroxysmal atrial fibrillation) ([place]) 05/20/2017  . HTN (hypertension) 05/20/2017  . Giant cell arteritis ([place]) 05/20/2017  . Coronary disease 05/20/2017  . Abnormal urinalysis 05/20/2017  . Osteoporosis 05/08/2016  . Herniated nucleus pulposus, L5-S1,  right 11/04/2015  . Major depressive disorder, recurrent episode, moderate ([place]) 07/16/2013  . Abdominal pain, epigastric 06/22/2013  . Lumbar stenosis 04/25/2013  . Chronic insomnia 02/07/2013  . Elevated transaminase level 02/07/2013  . CVA (cerebral infarction) 10/29/2012  . Visual changes 10/28/2012  . Depression 10/28/2012  . Anxiety state, unspecified 10/28/2012  . Anemia, unspecified 10/28/2012  . Nonspecific (abnormal) findings on radiological and other examination of gastrointestinal tract 01/25/2012  . Hyponatremia 01/24/2012  . Hematuria 01/24/2012  . Enteritis 12/24/2011  . Abdominal pain, other specified site 12/24/2011  . Leg pain, right 02/18/2011  . Temporal arteritis ([place]) 09/01/2010  . Hypertension 09/01/2010  . Hyperlipidemia 09/01/2010  . History of atrial fibrillation 09/01/2010  . CAD (coronary artery disease) 09/01/2010  . GERD (gastroesophageal reflux disease) 09/01/2010  . Insomnia 09/01/2010    Past Surgical History:  Procedure Laterality Date  . ABDOMINAL HYSTERECTOMY  ~ 1984   vaginally  . BACK SURGERY  7-70yrs ago   X Stop  . BLADDER SUSPENSION  2001  . BREAST BIOPSY Right   . CATARACT EXTRACTION W/ INTRAOCULAR LENS IMPLANT Right ~ 08/2012  . CHEST TUBE INSERTION Left 03/08/2019   Procedure: Chest Tube Insertion;  Surgeon: Ivin Poot, MD;  Location: [facility];  Service: Thoracic;  Laterality: Left;  . COLONOSCOPY  01/26/2012   Procedure: COLONOSCOPY;  Surgeon: Ladene Artist, MD,FACG;  Location: [facility] ENDOSCOPY;  Service: Endoscopy;  Laterality: N/A;  note the EGD is possible  . CORONARY ANGIOPLASTY WITH STENT PLACEMENT  2006   X 1 stent  . EPIDURAL BLOCK INJECTION    . ESOPHAGOGASTRODUODENOSCOPY  01/26/2012   Procedure: ESOPHAGOGASTRODUODENOSCOPY (EGD);  Surgeon: Ladene Artist, MD,FACG;  Location: [facility] ENDOSCOPY;  Service: Endoscopy;  Laterality: N/A;  . HEMIARTHROPLASTY HIP Right 2012  . IR EPIDUROGRAPHY  04/20/2019  . LAPAROSCOPIC  CHOLECYSTECTOMY  2001  . LUMBAR FUSION  03/2013  . LUMBAR LAMINECTOMY/DECOMPRESSION MICRODISCECTOMY Right 11/04/2015   Procedure: Right Lumbar Five-Sacral One Microdiskectomy;  Surgeon: Kristeen Miss, MD;  Location: [facility] NEURO ORS;  Service: Neurosurgery;  Laterality: Right;  Right L5-S1 Microdiskectomy  . moles removed that required stiches     one on leg and one on face  . SUBXYPHOID PERICARDIAL WINDOW N/A 03/08/2019   Procedure: SUBXYPHOID PERICARDIAL WINDOW;  Surgeon: Ivin Poot, MD;  Location: [facility];  Service: Thoracic;  Laterality: N/A;  . TEE WITHOUT CARDIOVERSION N/A 03/08/2019   Procedure: TRANSESOPHAGEAL ECHOCARDIOGRAM (TEE);  Surgeon: Prescott Gum, Collier Salina, MD;  Location: [facility];  Service: Thoracic;  Laterality: N/A;  . TEMPORAL ARTERY BIOPSY / LIGATION Bilateral 2011  . TONSILLECTOMY AND ADENOIDECTOMY     at age 52  . TUBAL LIGATION  ~ 1982  . X-STOP IMPLANTATION  ~ 2010   "lower back" (10/28/2012)     OB History   No obstetric history  on file.     Family History  Problem Relation Age of Onset  . Brain cancer Mother 19  . Heart attack Father   . Breast cancer Daughter 98  . Heart disease Other   . Hypertension Other   . Arthritis Neg Hx   . Colon cancer Neg Hx   . Osteoporosis Neg Hx     Social History   Tobacco Use  . Smoking status: Never Smoker  . Smokeless tobacco: Never Used  Substance Use Topics  . Alcohol use: Yes    Comment: socially  . Drug use: No    Home Medications Prior to Admission medications   Medication Sig Start Date End Date Taking? Authorizing Provider  alendronate (FOSAMAX) 70 MG tablet Take 1 tablet (70 mg total) by mouth every 7 (seven) days. Take with a full glass of water on an empty stomach. 04/28/19   Burchette, Alinda Sierras, MD  Cholecalciferol (VITAMIN D-3) 25 MCG (1000 UT) CAPS Take 1,000 Units by mouth daily.    [provider]  diltiazem (CARDIZEM CD) 180 MG 24 hr capsule Take 1 capsule (180 mg total) by mouth daily. 06/08/19  09/06/19  Bhagat, Crista Luria, PA  ELIQUIS 5 MG TABS tablet TAKE 1 TABLET BY MOUTH TWICE A DAY 05/15/19   Burnell Blanks, MD  escitalopram (LEXAPRO) 10 MG tablet TAKE 1 TABLET BY MOUTH EVERY DAY 12/14/18   Burchette, Alinda Sierras, MD  famotidine (PEPCID) 20 MG tablet Take 1 tablet (20 mg total) by mouth daily. 06/02/19   Eugenie Filler, MD  ferrous sulfate 325 (65 FE) MG tablet Take 1 tablet (325 mg total) by mouth 2 (two) times daily with a meal. 04/21/19   Oswald Hillock, MD  furosemide (LASIX) 40 MG tablet Take 1 tablet (40 mg total) by mouth daily. 06/03/19   Eugenie Filler, MD  HYDROcodone-acetaminophen (NORCO/VICODIN) 5-325 MG tablet Take one tablet by mouth every 8 hours as needed for pain. May refill in two months. 08/05/18   Burchette, Alinda Sierras, MD  lactobacillus acidophilus (BACID) TABS tablet Take 2 tablets by mouth 3 (three) times daily. 06/01/19   Eugenie Filler, MD  losartan (COZAAR) 100 MG tablet Take 1 tablet (100 mg total) by mouth daily. 06/03/19   Eugenie Filler, MD  metoprolol tartrate (LOPRESSOR) 100 MG tablet Take 1 tablet (100 mg total) by mouth 2 (two) times daily. Please schedule an appt for further refills 1st attempt 04/26/19   Burnell Blanks, MD  ondansetron (ZOFRAN ODT) 4 MG disintegrating tablet Take 1 tablet (4 mg total) by mouth every 8 (eight) hours as needed for nausea or vomiting. 01/12/19   Gareth Morgan, MD  potassium chloride SA (KLOR-CON) 10 MEQ tablet Take 1 tablet (10 mEq total) by mouth daily. 04/21/19   Oswald Hillock, MD  predniSONE (DELTASONE) 10 MG tablet Take 1 tablet (10 mg total) by mouth daily. 04/21/19   Oswald Hillock, MD  vancomycin (VANCOCIN) 125 MG capsule Vancomycin 125 mg 1 by mouth  Three times a day for 7 days Vancomycin 125 mg 1 by mouth  Two times a day for 7 days Vancomycin 125 mg 1 by mouth  One  time daily for 7 days. 06/09/19   Noralyn Pick, NP  vitamin C (ASCORBIC ACID) 500 MG tablet Take 1,000 mg by mouth daily.      [provider]  vitamin E (VITAMIN E) 400 UNIT capsule Take 400 Units by mouth daily.  [provider]    Allergies    Dextromethorphan  Review of Systems   Review of Systems  Constitutional: Negative for chills, diaphoresis and fever.  Respiratory: Negative for shortness of breath.   Cardiovascular: Negative for chest pain.  Gastrointestinal: Negative for nausea and vomiting.  Musculoskeletal: Positive for back pain.  Neurological: Negative for weakness and numbness.  All other systems reviewed and are negative.   Physical Exam Updated Vital Signs BP (!) 112/56 (BP Location: Right Arm)   Pulse 66   Temp 98.1 F (36.7 C) (Oral)   Resp 18   Ht 5\' 4"  (1.626 m)   Wt 67.1 kg   SpO2 96%   BMI 25.40 kg/m   Physical Exam Vitals and nursing note reviewed.  Constitutional:      General: She is not in acute distress.    Appearance: She is well-developed. She is not diaphoretic.  HENT:     Head: Normocephalic and atraumatic.     Mouth/Throat:     Mouth: Mucous membranes are moist.     Pharynx: Oropharynx is clear.  Eyes:     Conjunctiva/sclera: Conjunctivae normal.  Cardiovascular:     Rate and Rhythm: Normal rate and regular rhythm.     Pulses: Normal pulses.          Radial pulses are 2+ on the right side and 2+ on the left side.       Posterior tibial pulses are 2+ on the right side and 2+ on the left side.     Heart sounds: Normal heart sounds.     Comments: Tactile temperature in the extremities appropriate and equal bilaterally. Pulmonary:     Effort: Pulmonary effort is normal. No respiratory distress.     Breath sounds: Normal breath sounds.  Abdominal:     Palpations: Abdomen is soft.     Tenderness: There is no abdominal tenderness. There is no guarding.  Musculoskeletal:     Cervical back: Neck supple.       Back:     Right lower leg: No edema.     Left lower leg: No edema.  Skin:    General: Skin is warm and dry.  Neurological:      Mental Status: She is alert.     Comments: Sensation grossly intact to light touch in the lower extremities bilaterally. No saddle anesthesias. Strength 5/5 in the bilateral lower extremities. Slow, steady, unassisted ambulation.  Psychiatric:        Mood and Affect: Mood and affect normal.        Speech: Speech normal.        Behavior: Behavior normal.     ED Results / Procedures / Treatments   Labs (all labs ordered are listed, but only abnormal results are displayed) Labs Reviewed  COMPREHENSIVE METABOLIC PANEL - Abnormal; Notable for the following components:      Result Value   Glucose, Bld 193 (*)    Albumin 3.3 (*)    GFR calc non Af Amer 53 (*)    All other components within normal limits  CBC - Abnormal; Notable for the following components:   WBC 13.7 (*)    All other components within normal limits  DIFFERENTIAL - Abnormal; Notable for the following components:   Neutro Abs 11.7 (*)    Abs Immature Granulocytes 0.11 (*)    All other components within normal limits  CBC - Abnormal; Notable for the following components:   WBC 13.4 (*)  Hemoglobin 11.9 (*)    All other components within normal limits    EKG None  Radiology No results found.  Procedures Procedures (including critical care time)  Medications Ordered in ED Medications  fentaNYL (SUBLIMAZE) injection 50 mcg (50 mcg Intramuscular Given 06/13/19 1756)    ED Course  I have reviewed the triage vital signs and the nursing notes.  Pertinent labs & imaging results that were available during my care of the patient were reviewed by me and considered in my medical decision making (see chart for details).  Clinical Course as of Jun 14 47  Tue Jun 13, 2019  1633 Noted. May be due to her C. Diff.   WBC(!): 13.4 [SJ]  1857 Patient was seen by myself as well as PA provider.  Briefly is an 85 year old female with a history of lumbar spine surgery, chronic low back pain, follows with a pain management  clinic, presented to emergency department with acute flareup of her lower back pain.  She reports that her pain management specialist recently upped her hydrocodone dose about a week ago, and has been controlling her pain fairly well.  However yesterday she began having worsening lower back pain as well as sciatica into the back of her right thigh.  She denies any falls or trauma.  She denies any urinary incontinence or hesitation.  She says she has not had sciatica before.  She came to the ED today because she felt like her pain was intensifying.  Here in the ED on my exam the patient appears fairly comfortable (she had received 50 mcg IV fentanyl prior to my assessment).  She had full range of motion of the legs.  She did have pain in her lower back.  There were no neurological deficits.  There is no saddle anesthesia.  Low suspicion for cauda equina based on this.  She was able to ambulate in the ED.  I felt it is reasonable discharge her home.  Advised that she follow-up with her pain management clinic and her spine surgeons office.  She has home health aide set up and lives with her husband and her sister - she does not feel that she needs more assistance at home.   [MT]    Clinical Course User Index [MT] Trifan, Carola Rhine, MD [SJ] Layla Maw   MDM Rules/Calculators/A&P                      Patient presents complaining of chronic back pain.  She does not have acute focal neurologic deficits.  She is ambulatory. She had improved pain with analgesic given here in the ED. I reviewed and interpreted the patient's lab results.  Patient states she has been on chronic prednisone daily for several years.  We hesitate to add more steroids to her regimen, even temporarily, due to increased risk for fracture.  Return precautions discussed.  Patient voices understanding of these instructions, accepts the plan, and is comfortable with discharge.   Findings and plan of care discussed with Myrtie Cruise, MD. Dr. Langston Masker personally evaluated and examined this patient.   Final Clinical Impression(s) / ED Diagnoses Final diagnoses:  Low back pain with right-sided sciatica, unspecified back pain laterality, unspecified chronicity    Rx / DC Orders ED Discharge Orders    None       Layla Maw 06/14/19 0057    Wyvonnia Dusky, MD 06/14/19 1415

## 2019-06-14 ENCOUNTER — Other Ambulatory Visit: Payer: Self-pay | Admitting: *Deleted

## 2019-06-14 ENCOUNTER — Inpatient Hospital Stay: Payer: Self-pay | Admitting: Family Medicine

## 2019-06-14 DIAGNOSIS — M5136 Other intervertebral disc degeneration, lumbar region: Secondary | ICD-10-CM

## 2019-06-14 DIAGNOSIS — I5032 Chronic diastolic (congestive) heart failure: Secondary | ICD-10-CM | POA: Diagnosis not present

## 2019-06-14 DIAGNOSIS — H269 Unspecified cataract: Secondary | ICD-10-CM | POA: Diagnosis not present

## 2019-06-14 DIAGNOSIS — D631 Anemia in chronic kidney disease: Secondary | ICD-10-CM | POA: Diagnosis not present

## 2019-06-14 DIAGNOSIS — I130 Hypertensive heart and chronic kidney disease with heart failure and stage 1 through stage 4 chronic kidney disease, or unspecified chronic kidney disease: Secondary | ICD-10-CM | POA: Diagnosis not present

## 2019-06-14 DIAGNOSIS — M316 Other giant cell arteritis: Secondary | ICD-10-CM | POA: Diagnosis not present

## 2019-06-14 DIAGNOSIS — I2510 Atherosclerotic heart disease of native coronary artery without angina pectoris: Secondary | ICD-10-CM | POA: Diagnosis not present

## 2019-06-14 DIAGNOSIS — K5790 Diverticulosis of intestine, part unspecified, without perforation or abscess without bleeding: Secondary | ICD-10-CM | POA: Diagnosis not present

## 2019-06-14 DIAGNOSIS — A419 Sepsis, unspecified organism: Secondary | ICD-10-CM | POA: Diagnosis not present

## 2019-06-14 DIAGNOSIS — A0472 Enterocolitis due to Clostridium difficile, not specified as recurrent: Secondary | ICD-10-CM | POA: Diagnosis not present

## 2019-06-14 DIAGNOSIS — N183 Chronic kidney disease, stage 3 unspecified: Secondary | ICD-10-CM | POA: Diagnosis not present

## 2019-06-14 DIAGNOSIS — M80052D Age-related osteoporosis with current pathological fracture, left femur, subsequent encounter for fracture with routine healing: Secondary | ICD-10-CM | POA: Diagnosis not present

## 2019-06-14 DIAGNOSIS — I480 Paroxysmal atrial fibrillation: Secondary | ICD-10-CM | POA: Diagnosis not present

## 2019-06-14 NOTE — Patient Outreach (Addendum)
[facility]) Care Management  06/14/2019  Betty Jackson June 21, 1934 [id number]   CSW made a second attempt to try and contact patient's daughter, [name] today to perform the initial phone assessment on patient, as well as assess and assist with social work needs and services, without success.  CSW was unable to leave a HIPAA compliant message on voicemail for [name], receiving an automated recording that her mailbox is full and unable to accept new messages at this time.  CSW will make a third and final outreach attempt on Wednesday, June 21, 2019, if a return call is not received from [name] in the meantime.  CSW noted that patient presented to the Emergency Department at [facility] on Tuesday, June 13, 2019, with complaints of acute on chronic lower back pain.  Patient's pain management specialist increased her Hydrocodone dosing from 5 mg to 10 mg last week, but patient continues to complain of uncontrollable pain.  Fortunately, [name], Inpatient Hospital Transition of Care Case Manager/Licensed Clinical Social Worker, was able to make contact with [name] while patient was hospitalized, encouraging her to contact CSW directly, providing her with CSW's contact information.  Nat Christen, BSW, MSW, LCSW  Licensed Education officer, [facility]  Mailing [place] [address] [place], [place] [address] Physical Address-[address], [place], [place] [address] Toll Free Main # [phone] Fax # [phone] Cell # [phone]  Office # [phone] [name]

## 2019-06-15 ENCOUNTER — Other Ambulatory Visit: Payer: Self-pay

## 2019-06-15 ENCOUNTER — Ambulatory Visit: Payer: Medicare Other | Admitting: *Deleted

## 2019-06-15 ENCOUNTER — Ambulatory Visit: Payer: Self-pay | Admitting: *Deleted

## 2019-06-16 ENCOUNTER — Telehealth: Payer: Self-pay | Admitting: Family Medicine

## 2019-06-16 ENCOUNTER — Telehealth (INDEPENDENT_AMBULATORY_CARE_PROVIDER_SITE_OTHER): Payer: Medicare Other | Admitting: Family Medicine

## 2019-06-16 DIAGNOSIS — Z8679 Personal history of other diseases of the circulatory system: Secondary | ICD-10-CM

## 2019-06-16 DIAGNOSIS — M545 Low back pain: Secondary | ICD-10-CM | POA: Diagnosis not present

## 2019-06-16 DIAGNOSIS — G8929 Other chronic pain: Secondary | ICD-10-CM | POA: Diagnosis not present

## 2019-06-16 DIAGNOSIS — R296 Repeated falls: Secondary | ICD-10-CM | POA: Diagnosis not present

## 2019-06-16 NOTE — Progress Notes (Signed)
This visit type was conducted due to national recommendations for restrictions regarding the COVID-19 pandemic in an effort to limit this patient's exposure and mitigate transmission in our community.   Virtual Visit via Video Note  I connected with Betty Jackson on 06/16/19 at 10:30 AM EDT by a video enabled telemedicine application and verified that I am speaking with the correct person using two identifiers.  Location patient: home Location provider:work or home office Persons participating in the virtual visit: patient, provider, and patient's daughter-Melissa  I discussed the limitations of evaluation and management by telemedicine and the availability of in person appointments. The patient expressed understanding and agreed to proceed.   HPI: Betty Jackson has multiple chronic problems including history of temporal arteritis, chronic low-dose prednisone use, recurrent falls, osteoporosis, lumbar stenosis, atrial fibrillation, hyperlipidemia, CAD, chronic back pain.  She has had tremendous back pain recently.  She has been followed by neurosurgical group and recently had her hydrocodone increased from 5 mg every 6 hours to 10 mg every 6 hours.  Recent ER notes reviewed.  She went into the ER with acute flareup of low back pain.  She began having worsening pain especially radiating into the right thigh 1 day prior to ER visit.  There is no recent trauma.  She received 50 mcg of IV fentanyl.  She had CBC which showed white count of 13,000 but she is on chronic prednisone.  Her hemoglobin was stable.  Her chemistries were unremarkable.  She was sent back home.  She still having some breakthrough pain with hydrocodone 10 mg every 6 hours.  She has home PT set up.  Daughter had called requesting consult for palliative care both for Ms. Jackson and her husband.  She has had some recent diarrhea and has been treated with vancomycin for C. Difficile.  She has become much more debilitated in recent years.  She  is try to ambulate on her own at times and has had some falls.  Her family has encouraged her not to ambulate without assistance.  They do have a bedside commode.  She has a walker to use at all times.   ROS: See pertinent positives and negatives per HPI.  Past Medical History:  Diagnosis Date  . Acute respiratory failure with hypoxia ([place]) 05/20/2017  . Anemia, unspecified 10/28/2012  . Anxiety state, unspecified 10/28/2012  . CAD (coronary artery disease)    a. Stent RCA in [facility];  b. Cath approx 2009 - nonobs per pt report.  . Cataract    immature on the left eye  . Chronic insomnia 02/07/2013  . Chronic lower back pain    scoliosis  . CKD (chronic kidney disease) stage 3, GFR 30-59 ml/min   . Complication of anesthesia    pt has a very high tolerance to meds  . CVA (cerebral infarction) 10/29/2012  . DDD (degenerative disc disease)   . Depression   . Diverticulitis    hx of  . Diverticulosis   . Enteritis   . GERD (gastroesophageal reflux disease) 09/01/2010  . Giant cell arteritis ([place])   . Hemorrhoids   . Herniated nucleus pulposus, L5-S1, right 11/04/2015  . History of scabies   . HTN (hypertension) 05/20/2017  . Hyperlipidemia    takes Lipitor daily  . Hypertension    takes Amlodipine,Losartan,Metoprolol,and HCTZ daily  . Incontinence of urine   . Insomnia    takes Restoril nightly  . Lumbar stenosis 04/25/2013  . Major depressive disorder, recurrent episode,  moderate ([place]) 07/16/2013  . Migraines    "back in my 20's; none since" (10/28/2012)  . Osteoporosis   . PAF (paroxysmal atrial fibrillation) ([place]) 2011   a. lone epidode in 2011 according to notes.  . Rheumatoid arthritis ([place])   . Scoliosis   . Sepsis ([place]) 05/2019  . Sinus bradycardia    a. on chronic bb  . Temporal arteritis ([place]) 2011   a. followed @ [facility]; potential flareup 10/28/2012/notes 10/28/2012  . Vocal cord dysfunction    "they don't operate properly" (10/28/2012)    Past Surgical History:   Procedure Laterality Date  . ABDOMINAL HYSTERECTOMY  ~ 1984   vaginally  . BACK SURGERY  7-66yrs ago   X Stop  . BLADDER SUSPENSION  2001  . BREAST BIOPSY Right   . CATARACT EXTRACTION W/ INTRAOCULAR LENS IMPLANT Right ~ 08/2012  . CHEST TUBE INSERTION Left 03/08/2019   Procedure: Chest Tube Insertion;  Surgeon: Ivin Poot, MD;  Location: [place];  Service: Thoracic;  Laterality: Left;  . COLONOSCOPY  01/26/2012   Procedure: COLONOSCOPY;  Surgeon: Ladene Artist, MD,FACG;  Location: [facility] ENDOSCOPY;  Service: Endoscopy;  Laterality: N/A;  note the EGD is possible  . CORONARY ANGIOPLASTY WITH STENT PLACEMENT  2006   X 1 stent  . EPIDURAL BLOCK INJECTION    . ESOPHAGOGASTRODUODENOSCOPY  01/26/2012   Procedure: ESOPHAGOGASTRODUODENOSCOPY (EGD);  Surgeon: Ladene Artist, MD,FACG;  Location: [facility] ENDOSCOPY;  Service: Endoscopy;  Laterality: N/A;  . HEMIARTHROPLASTY HIP Right 2012  . IR EPIDUROGRAPHY  04/20/2019  . LAPAROSCOPIC CHOLECYSTECTOMY  2001  . LUMBAR FUSION  03/2013  . LUMBAR LAMINECTOMY/DECOMPRESSION MICRODISCECTOMY Right 11/04/2015   Procedure: Right Lumbar Five-Sacral One Microdiskectomy;  Surgeon: Kristeen Miss, MD;  Location: [place] NEURO ORS;  Service: Neurosurgery;  Laterality: Right;  Right L5-S1 Microdiskectomy  . moles removed that required stiches     one on leg and one on face  . SUBXYPHOID PERICARDIAL WINDOW N/A 03/08/2019   Procedure: SUBXYPHOID PERICARDIAL WINDOW;  Surgeon: Ivin Poot, MD;  Location: [place];  Service: Thoracic;  Laterality: N/A;  . TEE WITHOUT CARDIOVERSION N/A 03/08/2019   Procedure: TRANSESOPHAGEAL ECHOCARDIOGRAM (TEE);  Surgeon: Prescott Gum, Collier [place]ina, MD;  Location: [place];  Service: Thoracic;  Laterality: N/A;  . TEMPORAL ARTERY BIOPSY / LIGATION Bilateral 2011  . TONSILLECTOMY AND ADENOIDECTOMY     at age 67  . TUBAL LIGATION  ~ 1982  . X-STOP IMPLANTATION  ~ 2010   "lower back" (10/28/2012)    Family History  Problem Relation Age of Onset  . Brain  cancer Mother 12  . Heart attack Father   . Breast cancer Daughter 80  . Heart disease Other   . Hypertension Other   . Arthritis Neg Hx   . Colon cancer Neg Hx   . Osteoporosis Neg Hx     SOCIAL HX: Lives at home with her husband.  Very supportive children.  Other family members of also helped with care.   Current Outpatient Medications:  .  alendronate (FOSAMAX) 70 MG tablet, Take 1 tablet (70 mg total) by mouth every 7 (seven) days. Take with a full glass of water on an empty stomach., Disp: 4 tablet, Rfl: 11 .  Cholecalciferol (VITAMIN D-3) 25 MCG (1000 UT) CAPS, Take 1,000 Units by mouth daily., Disp: , Rfl:  .  ELIQUIS 5 MG TABS tablet, TAKE 1 TABLET BY MOUTH TWICE A DAY, Disp: 60 tablet, Rfl: 9 .  escitalopram (LEXAPRO) 10  MG tablet, TAKE 1 TABLET BY MOUTH EVERY DAY, Disp: 90 tablet, Rfl: 3 .  famotidine (PEPCID) 20 MG tablet, Take 1 tablet (20 mg total) by mouth daily., Disp: 30 tablet, Rfl: 1 .  ferrous sulfate 325 (65 FE) MG tablet, Take 1 tablet (325 mg total) by mouth 2 (two) times daily with a meal., Disp: 60 tablet, Rfl: 3 .  FLORASTOR 250 MG capsule, , Disp: , Rfl:  .  furosemide (LASIX) 40 MG tablet, Take 1 tablet (40 mg total) by mouth daily., Disp: 30 tablet, Rfl:  .  HYDROcodone-acetaminophen (NORCO) 10-325 MG tablet, Take 0.5-1 tablets by mouth every 6 (six) hours as needed., Disp: , Rfl:  .  lactobacillus acidophilus (BACID) TABS tablet, Take 2 tablets by mouth 3 (three) times daily., Disp: 60 tablet, Rfl: 0 .  losartan (COZAAR) 100 MG tablet, Take 1 tablet (100 mg total) by mouth daily., Disp:  , Rfl:  .  metoprolol tartrate (LOPRESSOR) 100 MG tablet, Take 1 tablet (100 mg total) by mouth 2 (two) times daily. Please schedule an appt for further refills 1st attempt, Disp: 180 tablet, Rfl: 0 .  ondansetron (ZOFRAN ODT) 4 MG disintegrating tablet, Take 1 tablet (4 mg total) by mouth every 8 (eight) hours as needed for nausea or vomiting., Disp: 20 tablet, Rfl: 0 .   potassium chloride SA (KLOR-CON) 10 MEQ tablet, Take 1 tablet (10 mEq total) by mouth daily., Disp: 30 tablet, Rfl: 2 .  predniSONE (DELTASONE) 10 MG tablet, Take 1 tablet (10 mg total) by mouth daily., Disp: , Rfl:  .  vancomycin (VANCOCIN) 125 MG capsule, Vancomycin 125 mg 1 by mouth  Three times a day for 7 days Vancomycin 125 mg 1 by mouth  Two times a day for 7 days Vancomycin 125 mg 1 by mouth  One  time daily for 7 days., Disp: 42 capsule, Rfl: 0 .  vitamin C (ASCORBIC ACID) 500 MG tablet, Take 1,000 mg by mouth daily. , Disp: , Rfl:  .  vitamin E (VITAMIN E) 400 UNIT capsule, Take 400 Units by mouth daily., Disp: , Rfl:  .  diltiazem (CARDIZEM CD) 180 MG 24 hr capsule, Take 1 capsule (180 mg total) by mouth daily. (Patient taking differently: Take 180 mg by mouth in the morning, at noon, and at bedtime. ), Disp: 90 capsule, Rfl: 3  EXAM:  VITALS per patient if applicable:  GENERAL: alert, oriented, appears well and in no acute distress  HEENT: atraumatic, conjunttiva clear, no obvious abnormalities on inspection of external nose and ears  NECK: normal movements of the head and neck  LUNGS: on inspection no signs of respiratory distress, breathing rate appears normal, no obvious gross SOB, gasping or wheezing  CV: no obvious cyanosis  MS: moves all visible extremities without noticeable abnormality  PSYCH/NEURO: pleasant and cooperative, no obvious depression or anxiety, speech and thought processing grossly intact  ASSESSMENT AND PLAN:  Discussed the following assessment and plan:  #1 chronic back pain with history of lumbar stenosis and multiple previous back surgeries.  Progressively debilitated over time  -Palliative consult has been placed. -Continue follow-up with chronic pain management for now  #2 chronic atrial fibrillation.  Patient currently on diltiazem 60 mg 3 times daily and Eliquis  #3 high risk for falls and multiple previous falls  -Continue home PT.   Encouraged to use walker at all times and not to get up unless she has assistance   I discussed the assessment and treatment plan  with the patient. The patient was provided an opportunity to ask questions and all were answered. The patient agreed with the plan and demonstrated an understanding of the instructions.   The patient was advised to call back or seek an in-person evaluation if the symptoms worsen or if the condition fails to improve as anticipated.     Carolann Littler, MD

## 2019-06-16 NOTE — Telephone Encounter (Signed)
Okay to give verbal orders?  ?

## 2019-06-16 NOTE — Telephone Encounter (Signed)
Henderson Newcomer with [facility], is calling to get  verbal orders for OT 1 time a week for 2 weeks, 2 times a week for 1 week, and 1 time a week for 1 week. Thanks  Henderson Newcomer [phone]

## 2019-06-19 ENCOUNTER — Encounter (HOSPITAL_COMMUNITY): Payer: Self-pay | Admitting: *Deleted

## 2019-06-19 ENCOUNTER — Inpatient Hospital Stay (HOSPITAL_COMMUNITY)
Admission: EM | Admit: 2019-06-19 | Discharge: 2019-06-27 | DRG: 871 | Disposition: A | Discharge status: Skilled Nursing Facility | Payer: Medicare Other | Attending: Internal Medicine | Admitting: Internal Medicine

## 2019-06-19 ENCOUNTER — Emergency Department (HOSPITAL_COMMUNITY): Payer: Medicare Other

## 2019-06-19 ENCOUNTER — Other Ambulatory Visit: Payer: Self-pay

## 2019-06-19 ENCOUNTER — Other Ambulatory Visit: Payer: Self-pay | Admitting: *Deleted

## 2019-06-19 DIAGNOSIS — T402X5A Adverse effect of other opioids, initial encounter: Secondary | ICD-10-CM | POA: Diagnosis not present

## 2019-06-19 DIAGNOSIS — J383 Other diseases of vocal cords: Secondary | ICD-10-CM | POA: Diagnosis present

## 2019-06-19 DIAGNOSIS — I130 Hypertensive heart and chronic kidney disease with heart failure and stage 1 through stage 4 chronic kidney disease, or unspecified chronic kidney disease: Secondary | ICD-10-CM | POA: Diagnosis present

## 2019-06-19 DIAGNOSIS — Z66 Do not resuscitate: Secondary | ICD-10-CM | POA: Diagnosis not present

## 2019-06-19 DIAGNOSIS — R509 Fever, unspecified: Secondary | ICD-10-CM | POA: Diagnosis not present

## 2019-06-19 DIAGNOSIS — I2510 Atherosclerotic heart disease of native coronary artery without angina pectoris: Secondary | ICD-10-CM | POA: Diagnosis not present

## 2019-06-19 DIAGNOSIS — M5489 Other dorsalgia: Secondary | ICD-10-CM | POA: Diagnosis not present

## 2019-06-19 DIAGNOSIS — F411 Generalized anxiety disorder: Secondary | ICD-10-CM | POA: Diagnosis present

## 2019-06-19 DIAGNOSIS — N183 Chronic kidney disease, stage 3 unspecified: Secondary | ICD-10-CM | POA: Diagnosis present

## 2019-06-19 DIAGNOSIS — Z8249 Family history of ischemic heart disease and other diseases of the circulatory system: Secondary | ICD-10-CM

## 2019-06-19 DIAGNOSIS — K529 Noninfective gastroenteritis and colitis, unspecified: Secondary | ICD-10-CM | POA: Diagnosis present

## 2019-06-19 DIAGNOSIS — Z7401 Bed confinement status: Secondary | ICD-10-CM | POA: Diagnosis not present

## 2019-06-19 DIAGNOSIS — M549 Dorsalgia, unspecified: Secondary | ICD-10-CM

## 2019-06-19 DIAGNOSIS — G8929 Other chronic pain: Secondary | ICD-10-CM | POA: Diagnosis not present

## 2019-06-19 DIAGNOSIS — Y95 Nosocomial condition: Secondary | ICD-10-CM | POA: Diagnosis present

## 2019-06-19 DIAGNOSIS — F05 Delirium due to known physiological condition: Secondary | ICD-10-CM | POA: Diagnosis not present

## 2019-06-19 DIAGNOSIS — Z96641 Presence of right artificial hip joint: Secondary | ICD-10-CM | POA: Diagnosis present

## 2019-06-19 DIAGNOSIS — E872 Acidosis: Secondary | ICD-10-CM | POA: Diagnosis not present

## 2019-06-19 DIAGNOSIS — Z9861 Coronary angioplasty status: Secondary | ICD-10-CM

## 2019-06-19 DIAGNOSIS — G9340 Encephalopathy, unspecified: Secondary | ICD-10-CM

## 2019-06-19 DIAGNOSIS — I5032 Chronic diastolic (congestive) heart failure: Secondary | ICD-10-CM | POA: Diagnosis not present

## 2019-06-19 DIAGNOSIS — I10 Essential (primary) hypertension: Secondary | ICD-10-CM | POA: Diagnosis not present

## 2019-06-19 DIAGNOSIS — I4891 Unspecified atrial fibrillation: Secondary | ICD-10-CM | POA: Diagnosis not present

## 2019-06-19 DIAGNOSIS — M5124 Other intervertebral disc displacement, thoracic region: Secondary | ICD-10-CM | POA: Diagnosis not present

## 2019-06-19 DIAGNOSIS — Z7901 Long term (current) use of anticoagulants: Secondary | ICD-10-CM

## 2019-06-19 DIAGNOSIS — R0902 Hypoxemia: Secondary | ICD-10-CM | POA: Diagnosis not present

## 2019-06-19 DIAGNOSIS — G92 Toxic encephalopathy: Secondary | ICD-10-CM | POA: Diagnosis present

## 2019-06-19 DIAGNOSIS — J189 Pneumonia, unspecified organism: Secondary | ICD-10-CM | POA: Diagnosis not present

## 2019-06-19 DIAGNOSIS — E785 Hyperlipidemia, unspecified: Secondary | ICD-10-CM | POA: Diagnosis present

## 2019-06-19 DIAGNOSIS — Z90710 Acquired absence of both cervix and uterus: Secondary | ICD-10-CM

## 2019-06-19 DIAGNOSIS — A419 Sepsis, unspecified organism: Principal | ICD-10-CM | POA: Diagnosis present

## 2019-06-19 DIAGNOSIS — M545 Low back pain: Secondary | ICD-10-CM | POA: Diagnosis not present

## 2019-06-19 DIAGNOSIS — Z20822 Contact with and (suspected) exposure to covid-19: Secondary | ICD-10-CM | POA: Diagnosis not present

## 2019-06-19 DIAGNOSIS — K219 Gastro-esophageal reflux disease without esophagitis: Secondary | ICD-10-CM | POA: Diagnosis present

## 2019-06-19 DIAGNOSIS — I482 Chronic atrial fibrillation, unspecified: Secondary | ICD-10-CM | POA: Diagnosis not present

## 2019-06-19 DIAGNOSIS — F5104 Psychophysiologic insomnia: Secondary | ICD-10-CM | POA: Diagnosis present

## 2019-06-19 DIAGNOSIS — F329 Major depressive disorder, single episode, unspecified: Secondary | ICD-10-CM | POA: Diagnosis present

## 2019-06-19 DIAGNOSIS — I480 Paroxysmal atrial fibrillation: Secondary | ICD-10-CM | POA: Diagnosis present

## 2019-06-19 DIAGNOSIS — Z8619 Personal history of other infectious and parasitic diseases: Secondary | ICD-10-CM

## 2019-06-19 DIAGNOSIS — M810 Age-related osteoporosis without current pathological fracture: Secondary | ICD-10-CM | POA: Diagnosis not present

## 2019-06-19 DIAGNOSIS — R52 Pain, unspecified: Secondary | ICD-10-CM

## 2019-06-19 DIAGNOSIS — Z79899 Other long term (current) drug therapy: Secondary | ICD-10-CM

## 2019-06-19 DIAGNOSIS — Z7189 Other specified counseling: Secondary | ICD-10-CM | POA: Diagnosis not present

## 2019-06-19 DIAGNOSIS — Z888 Allergy status to other drugs, medicaments and biological substances status: Secondary | ICD-10-CM

## 2019-06-19 DIAGNOSIS — Z8673 Personal history of transient ischemic attack (TIA), and cerebral infarction without residual deficits: Secondary | ICD-10-CM

## 2019-06-19 DIAGNOSIS — S76312A Strain of muscle, fascia and tendon of the posterior muscle group at thigh level, left thigh, initial encounter: Secondary | ICD-10-CM | POA: Diagnosis not present

## 2019-06-19 DIAGNOSIS — Z515 Encounter for palliative care: Secondary | ICD-10-CM | POA: Diagnosis not present

## 2019-06-19 DIAGNOSIS — Z743 Need for continuous supervision: Secondary | ICD-10-CM | POA: Diagnosis not present

## 2019-06-19 DIAGNOSIS — M069 Rheumatoid arthritis, unspecified: Secondary | ICD-10-CM | POA: Diagnosis present

## 2019-06-19 DIAGNOSIS — Z7952 Long term (current) use of systemic steroids: Secondary | ICD-10-CM

## 2019-06-19 DIAGNOSIS — M48061 Spinal stenosis, lumbar region without neurogenic claudication: Secondary | ICD-10-CM | POA: Diagnosis not present

## 2019-06-19 DIAGNOSIS — R32 Unspecified urinary incontinence: Secondary | ICD-10-CM | POA: Diagnosis present

## 2019-06-19 DIAGNOSIS — M2550 Pain in unspecified joint: Secondary | ICD-10-CM | POA: Diagnosis not present

## 2019-06-19 LAB — CBC WITH DIFFERENTIAL/PLATELET
Abs Immature Granulocytes: 0.09 10*3/uL — ABNORMAL HIGH (ref 0.00–0.07)
Basophils Absolute: 0.1 10*3/uL (ref 0.0–0.1)
Basophils Relative: 0 %
Eosinophils Absolute: 0 10*3/uL (ref 0.0–0.5)
Eosinophils Relative: 0 %
HCT: 38.7 % (ref 36.0–46.0)
Hemoglobin: 11.8 g/dL — ABNORMAL LOW (ref 12.0–15.0)
Immature Granulocytes: 1 %
Lymphocytes Relative: 12 %
Lymphs Abs: 1.9 10*3/uL (ref 0.7–4.0)
MCH: 28.6 pg (ref 26.0–34.0)
MCHC: 30.5 g/dL (ref 30.0–36.0)
MCV: 93.9 fL (ref 80.0–100.0)
Monocytes Absolute: 1.7 10*3/uL — ABNORMAL HIGH (ref 0.1–1.0)
Monocytes Relative: 10 %
Neutro Abs: 12.7 10*3/uL — ABNORMAL HIGH (ref 1.7–7.7)
Neutrophils Relative %: 77 %
Platelets: 232 10*3/uL (ref 150–400)
RBC: 4.12 MIL/uL (ref 3.87–5.11)
RDW: 15.5 % (ref 11.5–15.5)
WBC: 16.4 10*3/uL — ABNORMAL HIGH (ref 4.0–10.5)
nRBC: 0 % (ref 0.0–0.2)

## 2019-06-19 LAB — COMPREHENSIVE METABOLIC PANEL
ALT: 14 U/L (ref 0–44)
AST: 19 U/L (ref 15–41)
Albumin: 3.4 g/dL — ABNORMAL LOW (ref 3.5–5.0)
Alkaline Phosphatase: 111 U/L (ref 38–126)
Anion gap: 15 (ref 5–15)
BUN: 13 mg/dL (ref 8–23)
CO2: 26 mmol/L (ref 22–32)
Calcium: 8.9 mg/dL (ref 8.9–10.3)
Chloride: 99 mmol/L (ref 98–111)
Creatinine, Ser: 0.8 mg/dL (ref 0.44–1.00)
GFR calc Af Amer: >60 mL/min (ref >60)
GFR calc non Af Amer: >60 mL/min (ref >60)
Glucose, Bld: 135 mg/dL — ABNORMAL HIGH (ref 70–99)
Potassium: 3.6 mmol/L (ref 3.5–5.1)
Sodium: 140 mmol/L (ref 135–145)
Total Bilirubin: 1.3 mg/dL — ABNORMAL HIGH (ref 0.3–1.2)
Total Protein: 7.3 g/dL (ref 6.5–8.1)

## 2019-06-19 LAB — URINALYSIS, ROUTINE W REFLEX MICROSCOPIC
Bilirubin Urine: NEGATIVE
Glucose, UA: NEGATIVE mg/dL
Ketones, ur: 15 mg/dL — AB
Nitrite: NEGATIVE
Protein, ur: NEGATIVE mg/dL
Specific Gravity, Urine: 1.01 (ref 1.005–1.030)
pH: 6 (ref 5.0–8.0)

## 2019-06-19 LAB — URINALYSIS, MICROSCOPIC (REFLEX): Bacteria, UA: NONE SEEN

## 2019-06-19 LAB — POC SARS CORONAVIRUS 2 AG -  ED: SARS Coronavirus 2 Ag: NEGATIVE

## 2019-06-19 LAB — LACTIC ACID, PLASMA
Lactic Acid, Venous: 3.2 mmol/L (ref 0.5–1.9)
Lactic Acid, Venous: 3 mmol/L (ref 0.5–1.9)

## 2019-06-19 MED ORDER — ACETAMINOPHEN 325 MG PO TABS
650.0000 mg | ORAL_TABLET | Freq: Four times a day (QID) | ORAL | Status: DC | PRN
  Administered 2019-06-22 – 2019-06-26 (×2): 650 mg via ORAL
  Filled 2019-06-19 (×4): qty 2

## 2019-06-19 MED ORDER — DILTIAZEM HCL 25 MG/5ML IV SOLN
10.0000 mg | Freq: Once | INTRAVENOUS | Status: AC
  Administered 2019-06-19: 10 mg via INTRAVENOUS
  Filled 2019-06-19: qty 5

## 2019-06-19 MED ORDER — SODIUM CHLORIDE 0.9 % IV SOLN: INTRAVENOUS | Status: AC

## 2019-06-19 MED ORDER — RISAQUAD PO CAPS
1.0000 | ORAL_CAPSULE | Freq: Three times a day (TID) | ORAL | Status: DC
  Administered 2019-06-20 – 2019-06-27 (×22): 1 via ORAL
  Filled 2019-06-19 (×25): qty 1

## 2019-06-19 MED ORDER — ACETAMINOPHEN 650 MG RE SUPP: 650.0000 mg | Freq: Four times a day (QID) | RECTAL | Status: DC | PRN

## 2019-06-19 MED ORDER — METOPROLOL TARTRATE 50 MG PO TABS
100.0000 mg | ORAL_TABLET | Freq: Two times a day (BID) | ORAL | Status: DC
  Administered 2019-06-20 – 2019-06-27 (×16): 100 mg via ORAL
  Filled 2019-06-19 (×4): qty 4
  Filled 2019-06-19: qty 2
  Filled 2019-06-19 (×11): qty 4
  Filled 2019-06-19: qty 2

## 2019-06-19 MED ORDER — ESCITALOPRAM OXALATE 10 MG PO TABS
10.0000 mg | ORAL_TABLET | Freq: Every day | ORAL | Status: DC
  Administered 2019-06-20 – 2019-06-27 (×8): 10 mg via ORAL
  Filled 2019-06-19 (×8): qty 1

## 2019-06-19 MED ORDER — PREDNISONE 10 MG PO TABS
10.0000 mg | ORAL_TABLET | Freq: Every day | ORAL | Status: DC
  Administered 2019-06-20 – 2019-06-22 (×3): 10 mg via ORAL
  Filled 2019-06-19 (×3): qty 1

## 2019-06-19 MED ORDER — DILTIAZEM HCL 60 MG PO TABS
60.0000 mg | ORAL_TABLET | Freq: Three times a day (TID) | ORAL | Status: DC
  Administered 2019-06-20 (×3): 60 mg via ORAL
  Filled 2019-06-19 (×2): qty 2
  Filled 2019-06-19: qty 1

## 2019-06-19 MED ORDER — VITAMIN D 25 MCG (1000 UNIT) PO TABS
1000.0000 [IU] | ORAL_TABLET | Freq: Every day | ORAL | Status: DC
  Administered 2019-06-20 – 2019-06-27 (×8): 1000 [IU] via ORAL
  Filled 2019-06-19 (×7): qty 1

## 2019-06-19 MED ORDER — SODIUM CHLORIDE 0.9 % IV SOLN
2.0000 g | INTRAVENOUS | Status: DC
  Administered 2019-06-19 – 2019-06-22 (×4): 2 g via INTRAVENOUS
  Filled 2019-06-19 (×6): qty 20

## 2019-06-19 MED ORDER — SODIUM CHLORIDE 0.9 % IV SOLN
500.0000 mg | INTRAVENOUS | Status: DC
  Administered 2019-06-19: 500 mg via INTRAVENOUS
  Filled 2019-06-19: qty 500

## 2019-06-19 MED ORDER — ASCORBIC ACID 500 MG PO TABS
1000.0000 mg | ORAL_TABLET | Freq: Every day | ORAL | Status: DC
  Administered 2019-06-20 – 2019-06-27 (×7): 1000 mg via ORAL
  Filled 2019-06-19 (×7): qty 2

## 2019-06-19 MED ORDER — VITAMIN E 180 MG (400 UNIT) PO CAPS
400.0000 [IU] | ORAL_CAPSULE | Freq: Every day | ORAL | Status: DC
  Administered 2019-06-20 – 2019-06-27 (×7): 400 [IU] via ORAL
  Filled 2019-06-19 (×8): qty 1

## 2019-06-19 MED ORDER — ACETAMINOPHEN 650 MG RE SUPP
650.0000 mg | Freq: Once | RECTAL | Status: AC
  Administered 2019-06-19: 650 mg via RECTAL
  Filled 2019-06-19: qty 1

## 2019-06-19 MED ORDER — FAMOTIDINE 20 MG PO TABS
20.0000 mg | ORAL_TABLET | Freq: Every day | ORAL | Status: DC
  Administered 2019-06-20 – 2019-06-27 (×7): 20 mg via ORAL
  Filled 2019-06-19 (×7): qty 1

## 2019-06-19 MED ORDER — APIXABAN 5 MG PO TABS
5.0000 mg | ORAL_TABLET | Freq: Two times a day (BID) | ORAL | Status: DC
  Administered 2019-06-20 – 2019-06-27 (×17): 5 mg via ORAL
  Filled 2019-06-19 (×18): qty 1

## 2019-06-19 MED ORDER — SODIUM CHLORIDE 0.9 % IV BOLUS
30.0000 mL/kg | Freq: Once | INTRAVENOUS | Status: AC
  Administered 2019-06-19: 2000 mL via INTRAVENOUS

## 2019-06-19 NOTE — ED Triage Notes (Signed)
Per EMS, pt's daughter called d/t  having pain in lower back. Pt takes hydrocodone for chronic back pain, dose was increased 10 days ago. Pt has not had medication since 5 AM this morning.   EMS says the family did not give her pain medication because they were concerned she was taking too much/not prescribed amount. Pt normally walks with walker but has not been able to walk today. Pt alert and oriented x 4.   BP 173/80 HR 110 RR 18 O2 97% Temp 97.8

## 2019-06-19 NOTE — Patient Outreach (Addendum)
[facility]) Care Management  06/19/2019  Betty Jackson 10-01-1934 [id number]    Referral Received 06/19/2019 Initial Outreach 06/19/2019  RN received a pharmacy referral via [facility]. RN spoke with pt's daughter today who is the primary caregiver Betty Jackson) who explains that she does not have a a pharmacy needs. States the pt is having pain control issues. States the pt's pain is so bad that she feels it can be better managed. States she recently had to call EMS due to the pt's pain levels. RN discussed working with the pt's provider for a better pain regimen. Explained [facility] pharmacy with assistance with managing medication however caregiver indicates she is very much aware of who to manage pt's medications with a use of the pillbox. Again is the higher dosages of pain medications she feels will assist the pt's pain control.  Caregiver also discussed pt's medical history with deterioration, stenosis and multiple fractures. States pt has met with a hospice agency but caregiver requested the difference between palliative and home with hospice. RN educated caregiver on the two services. Caregiver indicated she may contact the hospice services in [place].   RN discussed working with the pt's provider for pain management and offered to assist in any way. No needs via [facility] services at this time however appreciative for the call. Note RN inquired on a previous referral for [facility] social work however caregiver has missed two calls. RN alerted caregiver on the next outreach scheduled from the [facility] social worker to address further on pt's needs (mobile meals).  Plan: No other needs to address at this time as RN encouraged caregiver to respond to the pending outreach from the [facility] social worker to further address the pt's needs at the scheduled date and time provided. RN will close this discipline and continue to allow social work involvement with the recent request for resources on mobile meals.   Raina Mina, RN Care Management Coordinator [place] Office [phone]

## 2019-06-19 NOTE — ED Notes (Signed)
Pt asleep and resting comfortably at this time.  

## 2019-06-19 NOTE — ED Provider Notes (Signed)
[facility] DEPT Provider Note   CSN: [id number] Arrival date & time: 06/19/19  1447     History Chief Complaint  Patient presents with  . Back Pain    Betty Jackson is a 85 y.o. female.  HPI Patient presents to the emergency department with back pain.  The patient is unable to give me much history as she seems very lethargic.  The patient can answer some questions but not all of them.  The patient states that she has had back pain for quite a while.  Patient states she last had pain medication at 5 AM.  Family alerted me that they are concerned she may be having too much pain medication making her more drowsy.  The family states that she is not had any significant other issues that they are aware of.  They deny any upper respiratory symptoms.    Past Medical History:  Diagnosis Date  . Acute respiratory failure with hypoxia ([place]) 05/20/2017  . Anemia, unspecified 10/28/2012  . Anxiety state, unspecified 10/28/2012  . CAD (coronary artery disease)    a. Stent RCA in [facility];  b. Cath approx 2009 - nonobs per pt report.  . Cataract    immature on the left eye  . Chronic insomnia 02/07/2013  . Chronic lower back pain    scoliosis  . CKD (chronic kidney disease) stage 3, GFR 30-59 ml/min   . Complication of anesthesia    pt has a very high tolerance to meds  . CVA (cerebral infarction) 10/29/2012  . DDD (degenerative disc disease)   . Depression   . Diverticulitis    hx of  . Diverticulosis   . Enteritis   . GERD (gastroesophageal reflux disease) 09/01/2010  . Giant cell arteritis ([place])   . Hemorrhoids   . Herniated nucleus pulposus, L5-S1, right 11/04/2015  . History of scabies   . HTN (hypertension) 05/20/2017  . Hyperlipidemia    takes Lipitor daily  . Hypertension    takes Amlodipine,Losartan,Metoprolol,and HCTZ daily  . Incontinence of urine   . Insomnia    takes Restoril nightly  . Lumbar stenosis 04/25/2013  . Major depressive  disorder, recurrent episode, moderate ([place]) 07/16/2013  . Migraines    "back in my 20's; none since" (10/28/2012)  . Osteoporosis   . PAF (paroxysmal atrial fibrillation) ([place]) 2011   a. lone epidode in 2011 according to notes.  . Rheumatoid arthritis ([place])   . Scoliosis   . Sepsis ([place]) 05/2019  . Sinus bradycardia    a. on chronic bb  . Temporal arteritis ([place]) 2011   a. followed @ [facility]; potential flareup 10/28/2012/notes 10/28/2012  . Vocal cord dysfunction    "they don't operate properly" (10/28/2012)    Patient Active Problem List   Diagnosis Date Noted  . Lumbar degenerative disc disease   . Paroxysmal A-fib ([place])   . Hypomagnesemia   . C. difficile colitis   . Diarrhea 05/29/2019  . CHF (congestive heart failure) ([place]) 05/29/2019  . Recurrent falls 04/30/2019  . Pericardial effusion without cardiac tamponade 03/29/2019  . Hypoalbuminemia 03/27/2019  . Cardiac tamponade   . Pericarditis 03/06/2019  . Fever   . Septic shock ([place])   . Atypical chest pain   . Atrial fibrillation with rapid ventricular response ([place])   . Nausea 01/17/2019  . Acute diverticulitis 01/13/2019  . AKI (acute kidney injury) ([place]) 01/13/2019  . Chronic pain 01/13/2019  . Tachycardia 01/13/2019  . Hypokalemia 01/13/2019  .  Chronic low back pain 07/10/2018  . Influenza A 05/20/2017  . Sepsis ([place]) 05/20/2017  . Acute respiratory failure with hypoxia ([place]) 05/20/2017  . CKD (chronic kidney disease), stage III 05/20/2017  . PAF (paroxysmal atrial fibrillation) ([place]) 05/20/2017  . HTN (hypertension) 05/20/2017  . Giant cell arteritis ([place]) 05/20/2017  . Coronary disease 05/20/2017  . Abnormal urinalysis 05/20/2017  . Osteoporosis 05/08/2016  . Herniated nucleus pulposus, L5-S1, right 11/04/2015  . Major depressive disorder, recurrent episode, moderate ([place]) 07/16/2013  . Abdominal pain, epigastric 06/22/2013  . Lumbar stenosis 04/25/2013  . Chronic insomnia 02/07/2013  . Elevated transaminase  level 02/07/2013  . CVA (cerebral infarction) 10/29/2012  . Visual changes 10/28/2012  . Depression 10/28/2012  . Anxiety state, unspecified 10/28/2012  . Anemia, unspecified 10/28/2012  . Nonspecific (abnormal) findings on radiological and other examination of gastrointestinal tract 01/25/2012  . Hyponatremia 01/24/2012  . Hematuria 01/24/2012  . Enteritis 12/24/2011  . Abdominal pain, other specified site 12/24/2011  . Leg pain, right 02/18/2011  . Temporal arteritis ([place]) 09/01/2010  . Hypertension 09/01/2010  . Hyperlipidemia 09/01/2010  . History of atrial fibrillation 09/01/2010  . CAD (coronary artery disease) 09/01/2010  . GERD (gastroesophageal reflux disease) 09/01/2010  . Insomnia 09/01/2010    Past Surgical History:  Procedure Laterality Date  . ABDOMINAL HYSTERECTOMY  ~ 1984   vaginally  . BACK SURGERY  7-55yrs ago   X Stop  . BLADDER SUSPENSION  2001  . BREAST BIOPSY Right   . CATARACT EXTRACTION W/ INTRAOCULAR LENS IMPLANT Right ~ 08/2012  . CHEST TUBE INSERTION Left 03/08/2019   Procedure: Chest Tube Insertion;  Surgeon: Ivin Poot, MD;  Location: [facility];  Service: Thoracic;  Laterality: Left;  . COLONOSCOPY  01/26/2012   Procedure: COLONOSCOPY;  Surgeon: Ladene Artist, MD,FACG;  Location: [facility] ENDOSCOPY;  Service: Endoscopy;  Laterality: N/A;  note the EGD is possible  . CORONARY ANGIOPLASTY WITH STENT PLACEMENT  2006   X 1 stent  . EPIDURAL BLOCK INJECTION    . ESOPHAGOGASTRODUODENOSCOPY  01/26/2012   Procedure: ESOPHAGOGASTRODUODENOSCOPY (EGD);  Surgeon: Ladene Artist, MD,FACG;  Location: [facility] ENDOSCOPY;  Service: Endoscopy;  Laterality: N/A;  . HEMIARTHROPLASTY HIP Right 2012  . IR EPIDUROGRAPHY  04/20/2019  . LAPAROSCOPIC CHOLECYSTECTOMY  2001  . LUMBAR FUSION  03/2013  . LUMBAR LAMINECTOMY/DECOMPRESSION MICRODISCECTOMY Right 11/04/2015   Procedure: Right Lumbar Five-Sacral One Microdiskectomy;  Surgeon: Kristeen Miss, MD;  Location: [facility]URO ORS;   Service: Neurosurgery;  Laterality: Right;  Right L5-S1 Microdiskectomy  . moles removed that required stiches     one on leg and one on face  . SUBXYPHOID PERICARDIAL WINDOW N/A 03/08/2019   Procedure: SUBXYPHOID PERICARDIAL WINDOW;  Surgeon: Ivin Poot, MD;  Location: [facility];  Service: Thoracic;  Laterality: N/A;  . TEE WITHOUT CARDIOVERSION N/A 03/08/2019   Procedure: TRANSESOPHAGEAL ECHOCARDIOGRAM (TEE);  Surgeon: Prescott Gum, Collier Salina, MD;  Location: [facility];  Service: Thoracic;  Laterality: N/A;  . TEMPORAL ARTERY BIOPSY / LIGATION Bilateral 2011  . TONSILLECTOMY AND ADENOIDECTOMY     at age 59  . TUBAL LIGATION  ~ 1982  . X-STOP IMPLANTATION  ~ 2010   "lower back" (10/28/2012)     OB History   No obstetric history on file.     Family History  Problem Relation Age of Onset  . Brain cancer Mother 107  . Heart attack Father   . Breast cancer Daughter 46  . Heart disease Other   .  Hypertension Other   . Arthritis Neg Hx   . Colon cancer Neg Hx   . Osteoporosis Neg Hx     Social History   Tobacco Use  . Smoking status: Never Smoker  . Smokeless tobacco: Never Used  Substance Use Topics  . Alcohol use: Yes    Comment: socially  . Drug use: No    Home Medications Prior to Admission medications   Medication Sig Start Date End Date Taking? Authorizing Provider  alendronate (FOSAMAX) 70 MG tablet Take 1 tablet (70 mg total) by mouth every 7 (seven) days. Take with a full glass of water on an empty stomach. 04/28/19   Burchette, Alinda Sierras, MD  Cholecalciferol (VITAMIN D-3) 25 MCG (1000 UT) CAPS Take 1,000 Units by mouth daily.    [provider]  diltiazem (CARDIZEM CD) 180 MG 24 hr capsule Take 1 capsule (180 mg total) by mouth daily. Patient taking differently: Take 180 mg by mouth in the morning, at noon, and at bedtime.  06/08/19 09/06/19  Bhagat, Crista Luria, PA  ELIQUIS 5 MG TABS tablet TAKE 1 TABLET BY MOUTH TWICE A DAY 05/15/19   Burnell Blanks, MD    escitalopram (LEXAPRO) 10 MG tablet TAKE 1 TABLET BY MOUTH EVERY DAY 12/14/18   Burchette, Alinda Sierras, MD  famotidine (PEPCID) 20 MG tablet Take 1 tablet (20 mg total) by mouth daily. 06/02/19   Eugenie Filler, MD  ferrous sulfate 325 (65 FE) MG tablet Take 1 tablet (325 mg total) by mouth 2 (two) times daily with a meal. 04/21/19   Oswald Hillock, MD  FLORASTOR 250 MG capsule  06/01/19   [provider]  furosemide (LASIX) 40 MG tablet Take 1 tablet (40 mg total) by mouth daily. 06/03/19   Eugenie Filler, MD  HYDROcodone-acetaminophen [facility]) 10-325 MG tablet Take 0.5-1 tablets by mouth every 6 (six) hours as needed. 06/08/19   [provider]  lactobacillus acidophilus (BACID) TABS tablet Take 2 tablets by mouth 3 (three) times daily. 06/01/19   Eugenie Filler, MD  losartan (COZAAR) 100 MG tablet Take 1 tablet (100 mg total) by mouth daily. 06/03/19   Eugenie Filler, MD  metoprolol tartrate (LOPRESSOR) 100 MG tablet Take 1 tablet (100 mg total) by mouth 2 (two) times daily. Please schedule an appt for further refills 1st attempt 04/26/19   Burnell Blanks, MD  ondansetron (ZOFRAN ODT) 4 MG disintegrating tablet Take 1 tablet (4 mg total) by mouth every 8 (eight) hours as needed for nausea or vomiting. 01/12/19   Gareth Morgan, MD  potassium chloride SA (KLOR-CON) 10 MEQ tablet Take 1 tablet (10 mEq total) by mouth daily. 04/21/19   Oswald Hillock, MD  predniSONE (DELTASONE) 10 MG tablet Take 1 tablet (10 mg total) by mouth daily. 04/21/19   Oswald Hillock, MD  vancomycin (VANCOCIN) 125 MG capsule Vancomycin 125 mg 1 by mouth  Three times a day for 7 days Vancomycin 125 mg 1 by mouth  Two times a day for 7 days Vancomycin 125 mg 1 by mouth  One  time daily for 7 days. 06/09/19   Noralyn Pick, NP  vitamin C (ASCORBIC ACID) 500 MG tablet Take 1,000 mg by mouth daily.     [provider]  vitamin E (VITAMIN E) 400 UNIT capsule Take 400 Units by mouth daily.     [provider]    Allergies    Dextromethorphan  Review of Systems  Review of Systems Level 5 caveat applies due to altered mental status Physical Exam Updated Vital Signs BP 129/72   Pulse (!) 145   Temp (!) 102.9 F (39.4 C) (Rectal)   Resp (!) 29   SpO2 93%   Physical Exam Vitals and nursing note reviewed.  Constitutional:      General: She is not in acute distress.    Appearance: She is well-developed.  HENT:     Head: Normocephalic and atraumatic.  Eyes:     Pupils: Pupils are equal, round, and reactive to light.  Cardiovascular:     Rate and Rhythm: Normal rate and regular rhythm.     Heart sounds: Normal heart sounds. No murmur. No friction rub. No gallop.   Pulmonary:     Effort: Pulmonary effort is normal. No respiratory distress.     Breath sounds: Normal breath sounds. No wheezing.  Abdominal:     General: Bowel sounds are normal. There is no distension.     Palpations: Abdomen is soft.     Tenderness: There is no abdominal tenderness.  Musculoskeletal:     Cervical back: Normal range of motion and neck supple.  Skin:    General: Skin is warm and dry.     Capillary Refill: Capillary refill takes less than 2 seconds.     Findings: No erythema or rash.  Neurological:     Mental Status: She is alert and oriented to person, place, and time.     Motor: No abnormal muscle tone.     Coordination: Coordination normal.  Psychiatric:        Behavior: Behavior normal.     ED Results / Procedures / Treatments   Labs (all labs ordered are listed, but only abnormal results are displayed) Labs Reviewed  COMPREHENSIVE METABOLIC PANEL - Abnormal; Notable for the following components:      Result Value   Glucose, Bld 135 (*)    Albumin 3.4 (*)    Total Bilirubin 1.3 (*)    All other components within normal limits  CBC WITH DIFFERENTIAL/PLATELET - Abnormal; Notable for the following components:   WBC 16.4 (*)    Hemoglobin 11.8 (*)    Neutro Abs  12.7 (*)    Monocytes Absolute 1.7 (*)    Abs Immature Granulocytes 0.09 (*)    All other components within normal limits  URINALYSIS, ROUTINE W REFLEX MICROSCOPIC - Abnormal; Notable for the following components:   Hgb urine dipstick SMALL (*)    Ketones, ur 15 (*)    Leukocytes,Ua TRACE (*)    All other components within normal limits  LACTIC ACID, PLASMA - Abnormal; Notable for the following components:   Lactic Acid, Venous 3.2 (*)    All other components within normal limits  URINALYSIS, MICROSCOPIC (REFLEX) - Abnormal; Notable for the following components:   Non Squamous Epithelial PRESENT (*)    All other components within normal limits  CULTURE, BLOOD (ROUTINE X 2)  CULTURE, BLOOD (ROUTINE X 2)  URINE CULTURE  LACTIC ACID, PLASMA  POC SARS CORONAVIRUS 2 AG -  ED    EKG None  Radiology DG Chest Port 1 View  Result Date: 06/19/2019 CLINICAL DATA:  Fever. Weakness. EXAM: PORTABLE CHEST 1 VIEW COMPARISON:  05/28/2019 FINDINGS: Mild cardiac enlargement is unchanged. Right lung clear. Mild asymmetric increased opacification within the retrocardiac left lung base. Subsegmental atelectasis within the left midlung. No pleural effusion or edema. IMPRESSION: Left lung base opacity which may represent pneumonia. Electronically Signed  By: Kerby Moors M.D.   On: 06/19/2019 18:12    Procedures Procedures (including critical care time)  Medications Ordered in ED Medications  cefTRIAXone (ROCEPHIN) 2 g in sodium chloride 0.9 % 100 mL IVPB (0 g Intravenous Stopped 06/19/19 2008)  azithromycin (ZITHROMAX) 500 mg in sodium chloride 0.9 % 250 mL IVPB (500 mg Intravenous New Bag/Given 06/19/19 2007)  sodium chloride 0.9 % bolus 2,013 mL (2,000 mLs Intravenous New Bag/Given (Non-Interop) 06/19/19 1759)    ED Course  I have reviewed the triage vital signs and the nursing notes.  Pertinent labs & imaging results that were available during my care of the patient were reviewed by me and  considered in my medical decision making (see chart for details).    MDM Rules/Calculators/A&P                      Patient will be admitted for further evaluation and care.  It does appear she has a lower lobe pneumonia on the left.  This most likely is causing her decreased mentation.  The patient has perked up following some fluids and the start of her antibiotics.  Patient and family are made aware of the plan and all questions were answered.  I did discuss the patient's condition at length with her daughter who is the power of attorney. Final Clinical Impression(s) / ED Diagnoses Final diagnoses:  None    Rx / DC Orders ED Discharge Orders    None       Rebeca Allegra 06/20/19 2326    Carmin Muskrat, MD 06/20/19 [phone]

## 2019-06-19 NOTE — Telephone Encounter (Signed)
Lvm giving verbal orders okay

## 2019-06-19 NOTE — ED Notes (Signed)
Pure wick has been placed. Suction set to 45mmHg.  

## 2019-06-19 NOTE — Telephone Encounter (Signed)
OK 

## 2019-06-19 NOTE — H&P (Signed)
History and Physical    Betty Jackson [id number] DOB: Jan 09, 1935 DOA: 06/19/2019  PCP: Eulas Post, MD Patient coming from: Home  Chief Complaint: Back pain  HPI: Betty Jackson is a 85 y.o. female with medical history significant of CAD status post PCI, chronic diastolic congestive heart failure, chronic A. fib, scoliosis, chronic lower back pain status post L2-3 and L3-4 posterior fusion in 2015 and L5-S1 microdiscectomy in 2017, CKD stage III, CVA, hypertension, hyperlipidemia, giant cell arteritis, rheumatoid arthritis on chronic steroids, hospital admission 05/28/2019-06/01/2019 for sepsis secondary to C. difficile colitis presented to the ED for evaluation of lower back pain.  Family reported that patient takes hydrocodone for chronic back pain and the dose was increased 10 days ago.  Family did not give her additional pain medication today as they were concerned she was taking too much.  Patient normally walks with a walker but has not been able to walk today.  Patient is currently somnolent and oriented to self only.  No history could be obtained from her.  ED Course: Febrile with temperature 102.9 F.  Found to be in A. fib with RVR with heart rate up to 160s.  Tachypneic with respiratory rate up to 30s.  Not hypotensive.  Oxygen saturation in the low to mid 90s.  Labs showing leukocytosis with WBC count 16.4.  Lactic acid 3.2.  Hemoglobin 11.8, stable compared to prior labs.  UA not suggestive of infection.  Urine culture pending.  Blood culture x2 pending.  SARS-CoV-2 rapid antigen test negative. Chest x-ray showing left lung base opacity suspicious for pneumonia. Patient received Tylenol, Cardizem 10 mg, ceftriaxone, azithromycin, and 2 L normal saline boluses.  Review of Systems:  All systems reviewed and apart from history of presenting illness, are negative.  Past Medical History:  Diagnosis Date  . Acute respiratory failure with hypoxia ([place]) 05/20/2017  . Anemia, unspecified  10/28/2012  . Anxiety state, unspecified 10/28/2012  . CAD (coronary artery disease)    a. Stent RCA in [facility];  b. Cath approx 2009 - nonobs per pt report.  . Cataract    immature on the left eye  . Chronic insomnia 02/07/2013  . Chronic lower back pain    scoliosis  . CKD (chronic kidney disease) stage 3, GFR 30-59 ml/min   . Complication of anesthesia    pt has a very high tolerance to meds  . CVA (cerebral infarction) 10/29/2012  . DDD (degenerative disc disease)   . Depression   . Diverticulitis    hx of  . Diverticulosis   . Enteritis   . GERD (gastroesophageal reflux disease) 09/01/2010  . Giant cell arteritis ([place])   . Hemorrhoids   . Herniated nucleus pulposus, L5-S1, right 11/04/2015  . History of scabies   . HTN (hypertension) 05/20/2017  . Hyperlipidemia    takes Lipitor daily  . Hypertension    takes Amlodipine,Losartan,Metoprolol,and HCTZ daily  . Incontinence of urine   . Insomnia    takes Restoril nightly  . Lumbar stenosis 04/25/2013  . Major depressive disorder, recurrent episode, moderate ([place]) 07/16/2013  . Migraines    "back in my 20's; none since" (10/28/2012)  . Osteoporosis   . PAF (paroxysmal atrial fibrillation) ([place]) 2011   a. lone epidode in 2011 according to notes.  . Rheumatoid arthritis ([place])   . Scoliosis   . Sepsis ([place]) 05/2019  . Sinus bradycardia    a. on chronic bb  . Temporal arteritis ([place]) 2011  a. followed @ [facility]; potential flareup 10/28/2012/notes 10/28/2012  . Vocal cord dysfunction    "they don't operate properly" (10/28/2012)    Past Surgical History:  Procedure Laterality Date  . ABDOMINAL HYSTERECTOMY  ~ 1984   vaginally  . BACK SURGERY  7-56yrs ago   X Stop  . BLADDER SUSPENSION  2001  . BREAST BIOPSY Right   . CATARACT EXTRACTION W/ INTRAOCULAR LENS IMPLANT Right ~ 08/2012  . CHEST TUBE INSERTION Left 03/08/2019   Procedure: Chest Tube Insertion;  Surgeon: Ivin Poot, MD;  Location: [place];  Service: Thoracic;   Laterality: Left;  . COLONOSCOPY  01/26/2012   Procedure: COLONOSCOPY;  Surgeon: Ladene Artist, MD,FACG;  Location: [facility] ENDOSCOPY;  Service: Endoscopy;  Laterality: N/A;  note the EGD is possible  . CORONARY ANGIOPLASTY WITH STENT PLACEMENT  2006   X 1 stent  . EPIDURAL BLOCK INJECTION    . ESOPHAGOGASTRODUODENOSCOPY  01/26/2012   Procedure: ESOPHAGOGASTRODUODENOSCOPY (EGD);  Surgeon: Ladene Artist, MD,FACG;  Location: [facility] ENDOSCOPY;  Service: Endoscopy;  Laterality: N/A;  . HEMIARTHROPLASTY HIP Right 2012  . IR EPIDUROGRAPHY  04/20/2019  . LAPAROSCOPIC CHOLECYSTECTOMY  2001  . LUMBAR FUSION  03/2013  . LUMBAR LAMINECTOMY/DECOMPRESSION MICRODISCECTOMY Right 11/04/2015   Procedure: Right Lumbar Five-Sacral One Microdiskectomy;  Surgeon: Kristeen Miss, MD;  Location: [place] NEURO ORS;  Service: Neurosurgery;  Laterality: Right;  Right L5-S1 Microdiskectomy  . moles removed that required stiches     one on leg and one on face  . SUBXYPHOID PERICARDIAL WINDOW N/A 03/08/2019   Procedure: SUBXYPHOID PERICARDIAL WINDOW;  Surgeon: Ivin Poot, MD;  Location: [place];  Service: Thoracic;  Laterality: N/A;  . TEE WITHOUT CARDIOVERSION N/A 03/08/2019   Procedure: TRANSESOPHAGEAL ECHOCARDIOGRAM (TEE);  Surgeon: Prescott Gum, Collier Salina, MD;  Location: [place];  Service: Thoracic;  Laterality: N/A;  . TEMPORAL ARTERY BIOPSY / LIGATION Bilateral 2011  . TONSILLECTOMY AND ADENOIDECTOMY     at age 76  . TUBAL LIGATION  ~ 1982  . X-STOP IMPLANTATION  ~ 2010   "lower back" (10/28/2012)     reports that she has never smoked. She has never used smokeless tobacco. She reports current alcohol use. She reports that she does not use drugs.  Allergies  Allergen Reactions  . Dextromethorphan Rash    Family History  Problem Relation Age of Onset  . Brain cancer Mother 28  . Heart attack Father   . Breast cancer Daughter 78  . Heart disease Other   . Hypertension Other   . Arthritis Neg Hx   . Colon cancer Neg Hx   .  Osteoporosis Neg Hx     Prior to Admission medications   Medication Sig Start Date End Date Taking? Authorizing Provider  alendronate (FOSAMAX) 70 MG tablet Take 1 tablet (70 mg total) by mouth every 7 (seven) days. Take with a full glass of water on an empty stomach. 04/28/19  Yes Burchette, Alinda Sierras, MD  Cholecalciferol (VITAMIN D-3) 25 MCG (1000 UT) CAPS Take 1,000 Units by mouth daily.   Yes [provider]  diltiazem (CARDIZEM) 60 MG tablet Take 60 mg by mouth 3 (three) times daily.   Yes [provider]  ELIQUIS 5 MG TABS tablet TAKE 1 TABLET BY MOUTH TWICE A DAY Patient taking differently: Take 5 mg by mouth 2 (two) times daily.  05/15/19  Yes Burnell Blanks, MD  escitalopram (LEXAPRO) 10 MG tablet TAKE 1 TABLET BY MOUTH EVERY DAY Patient  taking differently: Take 10 mg by mouth daily.  12/14/18  Yes Burchette, Alinda Sierras, MD  famotidine (PEPCID) 20 MG tablet Take 1 tablet (20 mg total) by mouth daily. 06/02/19  Yes Eugenie Filler, MD  ferrous sulfate 325 (65 FE) MG tablet Take 1 tablet (325 mg total) by mouth 2 (two) times daily with a meal. 04/21/19  Yes Darrick Meigs, Marge Duncans, MD  furosemide (LASIX) 40 MG tablet Take 1 tablet (40 mg total) by mouth daily. 06/03/19  Yes Eugenie Filler, MD  HYDROcodone-acetaminophen [facility]) 10-325 MG tablet Take 0.5-1 tablets by mouth every 6 (six) hours as needed for moderate pain.  06/08/19  Yes [provider]  lactobacillus acidophilus (BACID) TABS tablet Take 2 tablets by mouth 3 (three) times daily. 06/01/19  Yes Eugenie Filler, MD  losartan (COZAAR) 100 MG tablet Take 1 tablet (100 mg total) by mouth daily. 06/03/19  Yes Eugenie Filler, MD  metoprolol tartrate (LOPRESSOR) 100 MG tablet Take 1 tablet (100 mg total) by mouth 2 (two) times daily. Please schedule an appt for further refills 1st attempt 04/26/19  Yes Burnell Blanks, MD  potassium chloride SA (KLOR-CON) 10 MEQ tablet Take 1 tablet (10 mEq total) by mouth  daily. 04/21/19  Yes Oswald Hillock, MD  predniSONE (DELTASONE) 10 MG tablet Take 1 tablet (10 mg total) by mouth daily. 04/21/19  Yes Oswald Hillock, MD  vancomycin (VANCOCIN) 125 MG capsule Vancomycin 125 mg 1 by mouth  Three times a day for 7 days Vancomycin 125 mg 1 by mouth  Two times a day for 7 days Vancomycin 125 mg 1 by mouth  One  time daily for 7 days. 06/09/19  Yes Noralyn Pick, NP  vitamin C (ASCORBIC ACID) 500 MG tablet Take 1,000 mg by mouth daily.    Yes [provider]  vitamin E (VITAMIN E) 400 UNIT capsule Take 400 Units by mouth daily.   Yes [provider]  diltiazem (CARDIZEM CD) 180 MG 24 hr capsule Take 1 capsule (180 mg total) by mouth daily. Patient not taking: Reported on 06/19/2019 06/08/19 09/06/19  Leanor Kail, PA  ondansetron (ZOFRAN ODT) 4 MG disintegrating tablet Take 1 tablet (4 mg total) by mouth every 8 (eight) hours as needed for nausea or vomiting. Patient not taking: Reported on 06/19/2019 01/12/19   Gareth Morgan, MD    Physical Exam: Vitals:   06/19/19 1945 06/19/19 2127 06/19/19 2157 06/19/19 2237  BP: 129/72 (!) 158/74 129/62 130/72  Pulse: (!) 145 (!) 120 (!) 123 (!) 117  Resp: (!) 29 (!) 32 (!) 31 (!) 27  Temp:   (!) 102.9 F (39.4 C)   TempSrc:   Oral   SpO2: 93% 93% 94% 96%    Physical Exam  Constitutional: She appears well-developed and well-nourished. No distress.  HENT:  Head: Normocephalic.  Eyes: Right eye exhibits no discharge. Left eye exhibits no discharge.  Cardiovascular: Intact distal pulses.  Tachycardic with heart rate in the 120s Irregularly irregular rhythm  Pulmonary/Chest: Effort normal. No respiratory distress. She has no wheezes. She has no rales.  Abdominal: Soft. Bowel sounds are normal. She exhibits no distension. There is no abdominal tenderness. There is no guarding.  Musculoskeletal:        General: No edema.     Cervical back: Neck supple.  Neurological:  Somnolent but opening  eyes on command.  Not following any other commands. Oriented to self only  Skin: Skin is warm and  dry. She is not diaphoretic.     Labs on Admission: I have personally reviewed following labs and imaging studies  CBC: Recent Labs  Lab 06/13/19 1447 06/13/19 1635 06/19/19 1714  WBC 13.7* 13.4* 16.4*  NEUTROABS  --  11.7* 12.7*  HGB 12.0 11.9* 11.8*  HCT 39.2 39.2 38.7  MCV 94.5 96.1 93.9  PLT 361 371 [id number]   Basic Metabolic Panel: Recent Labs  Lab 06/13/19 1447 06/19/19 1714  NA 140 140  K 4.8 3.6  CL 104 99  CO2 22 26  GLUCOSE 193* 135*  BUN 13 13  CREATININE 0.99 0.80  CALCIUM 9.0 8.9   GFR: Estimated Creatinine Clearance: 51.1 mL/min (by C-G formula based on SCr of 0.8 mg/dL). Liver Function Tests: Recent Labs  Lab 06/13/19 1447 06/19/19 1714  AST 34 19  ALT 28 14  ALKPHOS 86 111  BILITOT 0.9 1.3*  PROT 6.5 7.3  ALBUMIN 3.3* 3.4*   No results for input(s): LIPASE, AMYLASE in the last 168 hours. No results for input(s): AMMONIA in the last 168 hours. Coagulation Profile: No results for input(s): INR, PROTIME in the last 168 hours. Cardiac Enzymes: No results for input(s): CKTOTAL, CKMB, CKMBINDEX, TROPONINI in the last 168 hours. BNP (last 3 results) No results for input(s): PROBNP in the last 8760 hours. HbA1C: No results for input(s): HGBA1C in the last 72 hours. CBG: No results for input(s): GLUCAP in the last 168 hours. Lipid Profile: No results for input(s): CHOL, HDL, LDLCALC, TRIG, CHOLHDL, LDLDIRECT in the last 72 hours. Thyroid Function Tests: No results for input(s): TSH, T4TOTAL, FREET4, T3FREE, THYROIDAB in the last 72 hours. Anemia Panel: No results for input(s): VITAMINB12, FOLATE, FERRITIN, TIBC, IRON, RETICCTPCT in the last 72 hours. Urine analysis:    Component Value Date/Time   COLORURINE YELLOW 06/19/2019 1714   APPEARANCEUR CLEAR 06/19/2019 1714   LABSPEC 1.010 06/19/2019 1714   PHURINE 6.0 06/19/2019 1714   GLUCOSEU  NEGATIVE 06/19/2019 1714   GLUCOSEU NEGATIVE 07/31/2016 1018   HGBUR SMALL (A) 06/19/2019 1714   BILIRUBINUR NEGATIVE 06/19/2019 1714   BILIRUBINUR neg 07/31/2016 1012   KETONESUR 15 (A) 06/19/2019 1714   PROTEINUR NEGATIVE 06/19/2019 1714   UROBILINOGEN 0.2 07/31/2016 1018   UROBILINOGEN 0.2 07/31/2016 1012   NITRITE NEGATIVE 06/19/2019 1714   LEUKOCYTESUR TRACE (A) 06/19/2019 1714    Radiological Exams on Admission: DG Chest Port 1 View  Result Date: 06/19/2019 CLINICAL DATA:  Fever. Weakness. EXAM: PORTABLE CHEST 1 VIEW COMPARISON:  05/28/2019 FINDINGS: Mild cardiac enlargement is unchanged. Right lung clear. Mild asymmetric increased opacification within the retrocardiac left lung base. Subsegmental atelectasis within the left midlung. No pleural effusion or edema. IMPRESSION: Left lung base opacity which may represent pneumonia. Electronically Signed   By: Kerby Moors M.D.   On: 06/19/2019 18:12    EKG: Independently reviewed.  A. fib with RVR, heart rate 167.  Assessment/Plan Principal Problem:   HCAP (healthcare-associated pneumonia) Active Problems:   Sepsis ([place])   Chronic back pain   Atrial fibrillation with rapid ventricular response (H[facility])   Encephalopathy   Sepsis, suspect secondary to HCAP: Febrile, tachycardic, and tachypneic.  Oxygen saturation in the low to mid 90s.  Chest x-ray showing left lung base opacity suspicious for pneumonia.  SARS 2 rapid antigen test negative.  Per review of previous notes in the chart, patient has already received 2 doses of her Covid vaccination.  Plan is to continue antibiotic coverage with ceftriaxone and azithromycin.  MRSA  PCR previously negative.  Tylenol as needed for fevers.  Continuous pulse ox, supplemental oxygen as needed.  Blood culture x2 pending.  Received 2 L IV fluid boluses and heart rate has now improved.  Continue IV fluid hydration and trend lactate.  Continue to monitor WBC count.  SARS-CoV-2 PCR test pending.  Check  procalcitonin level.  A. fib with RVR: Likely precipitated by sepsis/underlying infection.  Heart rate was initially in the 160s but has now improved to the 110s after receiving IV fluid boluses and a dose of Cardizem in the ED.  Continue cautious IV fluid hydration given history of grade 2 diastolic dysfunction.  Continue home Cardizem, metoprolol, and Eliquis.  Monitor electrolytes/magnesium level.  Acute toxic metabolic encephalopathy: Suspect related to underlying infection and overuse of home opiates for chronic back pain.  Hold opiates/sedating medications at this time.  Patient is currently oriented to self only, somnolent, and not able to give any history.  Will order stat head CT.  Continue management of pneumonia as mentioned above.  Chronic back pain and history of multiple back surgeries: Currently appears comfortable and not complaining of any pain.  Will hold opiates given somnolence/altered mental status.  Order PT and OT evaluation.  Chronic diastolic congestive heart failure: No signs of volume overload at this time.  Hold Lasix.  Rheumatoid arthritis on chronic steroids: Continue home prednisone  DVT prophylaxis: Eliquis Code Status: DNR based on prior hospital records/patient's previously stated wishes. Family Communication: No family available at this time. Disposition Plan: Anticipate discharge after clinical improvement. Consults called: None Admission status: It is my clinical opinion that admission to INPATIENT is reasonable and necessary because of the expectation that this patient will require hospital care that crosses at least 2 midnights to treat this condition based on the medical complexity of the problems presented.  Given the aforementioned information, the predictability of an adverse outcome is felt to be significant.  The medical decision making on this patient was of high complexity and the patient is at high risk for clinical deterioration, therefore this is a  level 3 visit.  Shela Leff MD [facility]  If 7PM-7AM, please contact night-coverage [email or web address]  06/19/2019, 11:55 PM

## 2019-06-20 ENCOUNTER — Inpatient Hospital Stay (HOSPITAL_COMMUNITY): Payer: Medicare Other

## 2019-06-20 LAB — CBC
HCT: 32.5 % — ABNORMAL LOW (ref 36.0–46.0)
Hemoglobin: 9.9 g/dL — ABNORMAL LOW (ref 12.0–15.0)
MCH: 28.4 pg (ref 26.0–34.0)
MCHC: 30.5 g/dL (ref 30.0–36.0)
MCV: 93.1 fL (ref 80.0–100.0)
Platelets: 225 10*3/uL (ref 150–400)
RBC: 3.49 MIL/uL — ABNORMAL LOW (ref 3.87–5.11)
RDW: 15.4 % (ref 11.5–15.5)
WBC: 13.4 10*3/uL — ABNORMAL HIGH (ref 4.0–10.5)
nRBC: 0 % (ref 0.0–0.2)

## 2019-06-20 LAB — LACTIC ACID, PLASMA: Lactic Acid, Venous: 1.9 mmol/L (ref 0.5–1.9)

## 2019-06-20 LAB — MAGNESIUM: Magnesium: 2.2 mg/dL (ref 1.7–2.4)

## 2019-06-20 LAB — SARS CORONAVIRUS 2 (TAT 6-24 HRS): SARS Coronavirus 2: NEGATIVE

## 2019-06-20 LAB — MRSA PCR SCREENING: MRSA by PCR: NEGATIVE

## 2019-06-20 LAB — CBG MONITORING, ED: Glucose-Capillary: 123 mg/dL — ABNORMAL HIGH (ref 70–99)

## 2019-06-20 LAB — PROCALCITONIN: Procalcitonin: 0.36 ng/mL

## 2019-06-20 MED ORDER — CHLORHEXIDINE GLUCONATE CLOTH 2 % EX PADS
6.0000 | MEDICATED_PAD | Freq: Every day | CUTANEOUS | Status: DC
  Administered 2019-06-20 – 2019-06-27 (×8): 6 via TOPICAL

## 2019-06-20 MED ORDER — VANCOMYCIN 50 MG/ML ORAL SOLUTION
125.0000 mg | Freq: Two times a day (BID) | ORAL | Status: DC
  Administered 2019-06-20 – 2019-06-24 (×10): 125 mg via ORAL
  Filled 2019-06-20 (×12): qty 2.5

## 2019-06-20 MED ORDER — LABETALOL HCL 5 MG/ML IV SOLN
10.0000 mg | INTRAVENOUS | Status: DC | PRN
  Filled 2019-06-20: qty 4

## 2019-06-20 MED ORDER — ORAL CARE MOUTH RINSE
15.0000 mL | Freq: Two times a day (BID) | OROMUCOSAL | Status: DC
  Administered 2019-06-20 – 2019-06-27 (×12): 15 mL via OROMUCOSAL

## 2019-06-20 MED ORDER — TRAMADOL HCL 50 MG PO TABS
50.0000 mg | ORAL_TABLET | Freq: Two times a day (BID) | ORAL | Status: DC | PRN
  Administered 2019-06-20 – 2019-06-22 (×3): 50 mg via ORAL
  Filled 2019-06-20 (×4): qty 1

## 2019-06-20 MED ORDER — DOXYCYCLINE HYCLATE 100 MG PO TABS
100.0000 mg | ORAL_TABLET | Freq: Two times a day (BID) | ORAL | Status: DC
  Administered 2019-06-20 – 2019-06-22 (×5): 100 mg via ORAL
  Filled 2019-06-20 (×5): qty 1

## 2019-06-20 MED ORDER — DILTIAZEM HCL 90 MG PO TABS
90.0000 mg | ORAL_TABLET | Freq: Three times a day (TID) | ORAL | Status: DC
  Administered 2019-06-20 – 2019-06-27 (×19): 90 mg via ORAL
  Filled 2019-06-20 (×23): qty 1

## 2019-06-20 MED ORDER — FUROSEMIDE 40 MG PO TABS
40.0000 mg | ORAL_TABLET | Freq: Every day | ORAL | Status: DC
  Administered 2019-06-21 – 2019-06-22 (×2): 40 mg via ORAL
  Filled 2019-06-20 (×2): qty 1

## 2019-06-20 MED ORDER — HYDROCODONE-ACETAMINOPHEN 10-325 MG PO TABS
0.5000 | ORAL_TABLET | Freq: Four times a day (QID) | ORAL | Status: DC | PRN
  Administered 2019-06-20 – 2019-06-27 (×23): 1 via ORAL
  Filled 2019-06-20 (×25): qty 1

## 2019-06-20 MED ORDER — ONDANSETRON HCL 4 MG/2ML IJ SOLN: 4.0000 mg | Freq: Four times a day (QID) | INTRAMUSCULAR | Status: DC | PRN

## 2019-06-20 NOTE — ED Notes (Signed)
Pt belongings brought upstairs on stretcher with pt, including phone and charger, along with clothes.

## 2019-06-20 NOTE — ED Notes (Signed)
Pt more alert and is able to talk with nurse. Repeat temp 99.4 oral, pt able to swallow while sipping through straw and was able to tolerate PO medications.

## 2019-06-20 NOTE — Progress Notes (Signed)
PT Cancellation Note  Patient Details Name: Betty Jackson MRN: [id number] DOB: Mar 11, 1935   Cancelled Treatment:    Reason Eval/Treat Not Completed: Medical issues which prohibited therapy, patient moved  To ICU from Ed. Will check back tomorrow. [place] Pager [phone] Office [phone]    Claretha Cooper 06/20/2019, 3:13 PM

## 2019-06-20 NOTE — Progress Notes (Signed)
OT Cancellation Note  Patient Details Name: Betty Jackson MRN: [id number] DOB: May 25, 1934   Cancelled Treatment:    Reason Eval/Treat Not Completed: Other (comment);Patient not medically ready  Medical issues which prohibited therapy, patient moved  To ICU from Ed. Will check back tomorrow.   [place], Mickel Baas, OT Acute Rehabilitation Services [phone] [phone]    06/20/2019, 3:52 PM

## 2019-06-20 NOTE — ED Notes (Signed)
Given phone. Called daughter. Patient eating breakfast.

## 2019-06-20 NOTE — ED Notes (Signed)
Pt transported to CT ?

## 2019-06-20 NOTE — Progress Notes (Addendum)
Pharmacy Antibiotic Note  Betty Jackson is a 85 y.o. female admitted on 06/19/2019 with prevention of C. difficile infection .  Pharmacy has been consulted for vancomycin PO dosing. Patient admitted in the last 30 days with sepsis due to C. Difficile (05/29/2019).  She completed vancomycin QID PO therapy on 3/12 and was seen by GI and started on vancomycin taper 125mg  PO TID x 7 days then BID x 7 days then daily x 7 days.   Patient started on ceftriaxone/azithromycin 3/22 for CAP.  + fevers, leukocytosis, opacity on CXR,  No O2 (on RA).  No mention of ongoing diarrhea.  Patient noted at outpatient GI visit that she also did NOT have formed stools even prior to C. Difficile infection.    Plan: - Currently on vancomycin taper per GI starting 3/12 as stated above.  Currently on vancomycin 125mg  BID dosing - Continue that regimen while on CAP treatment  (if quickly defervesce, suggest treat CAP x 5 days)   - Once off antibiotics complete her course of vancomycin PO - vancomycin 125mg  daily   Optimal dose for C. Difficile prevention is unknown (range is daily to QID). Since she is already on BID taper dose so makes sense to continue that while on broad spectrum antibiotics (use shortest course possible)  - pharmacy to sign-off    Temp (24hrs), Avg:100.6 F (38.1 C), Min:98.7 F (37.1 C), Max:102.9 F (39.4 C)  Recent Labs  Lab 06/13/19 1447 06/13/19 1635 06/19/19 1714 06/19/19 1721 06/19/19 2005 06/20/19 0106  WBC 13.7* 13.4* 16.4*  --   --   --   CREATININE 0.99  --  0.80  --   --   --   LATICACIDVEN  --   --   --  3.2* 3.0* 1.9    Estimated Creatinine Clearance: 51.1 mL/min (by C-G formula based on SCr of 0.8 mg/dL).    Allergies  Allergen Reactions  . Dextromethorphan Rash     Thank you for allowing pharmacy to be a part of this patient's care.  Doreene Eland, PharmD, BCPS.   Work Cell: [phone] 06/20/2019 10:02 AM

## 2019-06-20 NOTE — Progress Notes (Signed)
PROGRESS NOTE    Betty Jackson    Code Status: DNR  DX:[id number] DOB: October 22, 1934 DOA: 06/19/2019 LOS: 1 days  PCP: Eulas Post, MD CC:  Chief Complaint  Patient presents with  . Back Pain       Hospital Summary   This is an 85 year old female with history of CAD status post PCI, chronic HFpEF, chronic atrial fibrillation, scoliosis, chronic low back pain status post L2-3, L3-4 posterior fusion in 2015 and L5-S1 microdiscectomy in 2017, CKD 3, CVA, hypertension, hyperlipidemia, giant cell arteritis, RA on chronic steroids with recent hospital admission 2/28 06/01/2019 for sepsis secondary to C. difficile colitis who presented to the ED for further evaluation of low back pain which she takes hydrocodone at home and was recently increased 10 days ago.  ED Course: Febrile with temperature 102.9 F.  Found to be in A. fib with RVR with heart rate up to 160s.  Tachypneic with respiratory rate up to 30s.  Not hypotensive.  Oxygen saturation in the low to mid 90s.  Labs showing leukocytosis with WBC count 16.4.  Lactic acid 3.2.  Hemoglobin 11.8, stable compared to prior labs.  UA not suggestive of infection.  Urine culture pending.  Blood culture x2 pending.  SARS-CoV-2 rapid antigen test negative.  Chest x-ray showing left lung base opacity suspicious for pneumonia. Patient received Tylenol, Cardizem 10 mg, ceftriaxone, azithromycin, and 2 L normal saline boluses.   A & P   Principal Problem:   HCAP (healthcare-associated pneumonia) Active Problems:   Sepsis ([place])   Chronic back pain   Atrial fibrillation with rapid ventricular response (HCC)   Encephalopathy   1. Sepsis, now SIRS suspected secondary to HAP a. Covid negative b. Continue ceftriaxone, change azithromycin to doxycycline in setting of A. fib with RVR c. Incentive spirometry d. Start prophylactic p.o. vancomycin for recent history of C. difficile while on IV antibiotics 2. A. fib with RVR a. Rates improved but still  tachycardic with Cardizem bolus in ED and IV fluids b. Increase p.o. Cardizem, monitor blood pressure and rates c. Continue metoprolol and Eliquis 3. Acute toxic metabolic encephalopathy a. Thought secondary to underlying infection and or use of home opiates for chronic back pain b. Opiates on hold since admission and seems to be at/near her baseline mental status c. will restart home Norco as patient is now having significant low back pain and to prevent withdrawal -monitor for mental status changes d. Delirium precautions 4. Acute on Chronic back pain a. Start home [place] b. Add on tramadol for breakthrough pain which patient tolerated in the past c. Lumbar x-ray d. PT/OT e. check vitamin d f. Palliative care for assistance with pain management and goals of care.  Family inquiring about hospice 5. Chronic HFpEF a. Lasix on hold on admission, received IV fluids b. Daily weights c. No further IV fluids, likely restart Lasix tomorrow 6. RA on chronic steroids, not in acute flare a. Continue home prednisone 10 mg daily b. Monitor for adrenal insufficiency consider stress dose steroids 7. Hypertension a. Holding home losartan b. Continue Cardizem c. As needed labetalol  DVT prophylaxis: Eliquis Family Communication: Patient's daughter has been updated  Disposition Plan:   Patient came from:   Home  Anticipated d/c place: TBD  Barriers to d/c: Pain, IV antibiotics, PT eval  Pressure injury documentation    None  Consultants  None  Procedures  None  Antibiotics   Anti-infectives (From admission, onward)   Start     Dose/Rate Route Frequency Ordered Stop   06/20/19 1200  vancomycin (VANCOCIN) 50 mg/mL oral solution 125 mg     125 mg Oral 2 times daily 06/20/19 1003     06/19/19 1845  cefTRIAXone (ROCEPHIN) 2 g in sodium chloride 0.9 % 100 mL IVPB     2 g 200 mL/hr over 30 Minutes  Intravenous Every 24 hours 06/19/19 1835     06/19/19 1845  azithromycin (ZITHROMAX) 500 mg in sodium chloride 0.9 % 250 mL IVPB     500 mg 250 mL/hr over 60 Minutes Intravenous Every 24 hours 06/19/19 1835          Subjective   Evaluated in the ED this morning.  Patient states she is now having recurrence of low back pain.  Long discussion with patient's daughter over the phone who states that over the past weeks patient has had acute onset low back pain which has been poorly controlled with her home Norco and has been recommended to increase dose at home.  Daughter states that she has had a very difficult time getting in touch with different physicians/teams in helping to treat her mother symptoms.  Considering hospice and wishes to discuss with palliative care.  Patient had an episode of emesis this a.m.  No other complaints  Objective   Vitals:   06/20/19 1300 06/20/19 1323 06/20/19 1330 06/20/19 1426  BP: 129/67 129/67 (!) 153/65 (!) 170/57  Pulse: 93 99 99   Resp: (!) 33 (!) 30 (!) 35   Temp:    (!) 97.3 F (36.3 C)  TempSrc:    Axillary  SpO2: 97% 94% 96%     Intake/Output Summary (Last 24 hours) at 06/20/2019 1558 Last data filed at 06/19/2019 2008 Gross per 24 hour  Intake 98.33 ml  Output --  Net 98.33 ml   There were no vitals filed for this visit.  Examination:  Physical Exam Vitals and nursing note reviewed.  Constitutional:      Comments: Chronically ill-appearing  HENT:     Head: Normocephalic.     Mouth/Throat:     Mouth: Mucous membranes are moist.  Eyes:     Conjunctiva/sclera: Conjunctivae normal.  Cardiovascular:     Rate and Rhythm: Tachycardia present. Rhythm irregular.  Pulmonary:     Effort: Pulmonary effort is normal. No accessory muscle usage.  Abdominal:     General: Abdomen is flat.  Musculoskeletal:     Right lower leg: No edema.     Left lower leg: No edema.     Comments: Negative straight leg raise test  Neurological:     Mental  Status: She is alert and oriented to person, place, and time. Mental status is at baseline.  Psychiatric:        Mood and Affect: Mood normal.        Behavior: Behavior normal.     Data Reviewed: I have personally reviewed following labs and imaging studies  CBC: Recent Labs  Lab 06/13/19 1635 06/19/19 1714  WBC 13.4* 16.4*  NEUTROABS 11.7* 12.7*  HGB 11.9* 11.8*  HCT 39.2 38.7  MCV 96.1 93.9  PLT 371 [id number]   Basic Metabolic Panel: Recent Labs  Lab 06/19/19 1714 06/19/19 1722  NA 140  --  K 3.6  --   CL 99  --   CO2 26  --   GLUCOSE 135*  --   BUN 13  --   CREATININE 0.80  --   CALCIUM 8.9  --   MG  --  2.2   GFR: Estimated Creatinine Clearance: 51.1 mL/min (by C-G formula based on SCr of 0.8 mg/dL). Liver Function Tests: Recent Labs  Lab 06/19/19 1714  AST 19  ALT 14  ALKPHOS 111  BILITOT 1.3*  PROT 7.3  ALBUMIN 3.4*   No results for input(s): LIPASE, AMYLASE in the last 168 hours. No results for input(s): AMMONIA in the last 168 hours. Coagulation Profile: No results for input(s): INR, PROTIME in the last 168 hours. Cardiac Enzymes: No results for input(s): CKTOTAL, CKMB, CKMBINDEX, TROPONINI in the last 168 hours. BNP (last 3 results) No results for input(s): PROBNP in the last 8760 hours. HbA1C: No results for input(s): HGBA1C in the last 72 hours. CBG: Recent Labs  Lab 06/20/19 0503  GLUCAP 123*   Lipid Profile: No results for input(s): CHOL, HDL, LDLCALC, TRIG, CHOLHDL, LDLDIRECT in the last 72 hours. Thyroid Function Tests: No results for input(s): TSH, T4TOTAL, FREET4, T3FREE, THYROIDAB in the last 72 hours. Anemia Panel: No results for input(s): VITAMINB12, FOLATE, FERRITIN, TIBC, IRON, RETICCTPCT in the last 72 hours. Sepsis Labs: Recent Labs  Lab 06/19/19 1721 06/19/19 1722 06/19/19 2005 06/20/19 0106  PROCALCITON  --  0.36  --   --   LATICACIDVEN 3.2*  --  3.0* 1.9    Recent Results (from the past 240 hour(s))  Culture,  blood (routine x 2)     Status: None (Preliminary result)   Collection Time: 06/19/19  5:15 PM   Specimen: BLOOD  Result Value Ref Range Status   Specimen Description BLOOD RIGHT WRIST  Final   Special Requests   Final    BOTTLES DRAWN AEROBIC AND ANAEROBIC Blood Culture adequate volume Performed at [facility] [address] [place], [place] [address]    Culture NO GROWTH < 12 HOURS  Final   Report Status PENDING  Incomplete  Culture, blood (routine x 2)     Status: None (Preliminary result)   Collection Time: 06/19/19  5:26 PM   Specimen: BLOOD  Result Value Ref Range Status   Specimen Description BLOOD LEFT ANTECUBITAL  Final   Special Requests   Final    BOTTLES DRAWN AEROBIC ONLY Blood Culture results may not be optimal due to an inadequate volume of blood received in culture bottles Performed at [facility], [place] [address], [place], [place] [address]    Culture NO GROWTH < 12 HOURS  Final   Report Status PENDING  Incomplete  SARS CORONAVIRUS 2 (TAT 6-24 HRS) Nasopharyngeal Nasopharyngeal Swab     Status: None   Collection Time: 06/20/19  1:06 AM   Specimen: Nasopharyngeal Swab  Result Value Ref Range Status   SARS Coronavirus 2 NEGATIVE NEGATIVE Final    Comment: (NOTE) SARS-CoV-2 target nucleic acids are NOT DETECTED. The SARS-CoV-2 RNA is generally detectable in upper and lower respiratory specimens during the acute phase of infection. Negative results do not preclude SARS-CoV-2 infection, do not rule out co-infections with other pathogens, and should not be used as the sole basis for treatment or other patient management decisions. Negative results must be combined with clinical observations, patient history, and epidemiological information. The expected result is Negative. Fact Sheet for Patients: SugarRoll.be Fact Sheet for  Healthcare Providers: https://www.woods-mathews.com/ This  test is not yet approved or cleared by the [place] and  has been authorized for detection and/or diagnosis of SARS-CoV-2 by FDA under an Emergency Use Authorization (EUA). This EUA will remain  in effect (meaning this test can be used) for the duration of the COVID-19 declaration under Section 56 4(b)(1) of the Act, 21 U.S.C. section 360bbb-3(b)(1), unless the authorization is terminated or revoked sooner. Performed at [facility], [place] [address], [place], [place] [address]          Radiology Studies: CT HEAD WO CONTRAST  Result Date: 06/20/2019 CLINICAL DATA:  Initial evaluation for acute encephalopathy. EXAM: CT HEAD WITHOUT CONTRAST TECHNIQUE: Contiguous axial images were obtained from the base of the skull through the vertex without intravenous contrast. COMPARISON:  Prior head CT from 02/11/2019 FINDINGS: Brain: Examination degraded by motion artifact. Generalized age-related cerebral atrophy with moderate chronic small vessel ischemic disease. No acute intracranial hemorrhage. No acute large vessel territory infarct. No mass lesion, midline shift or mass effect. No hydrocephalus. No extra-axial fluid collection. Vascular: No hyperdense vessel. Calcified atherosclerosis present at skull base. Skull: Scalp soft tissues within normal limits. Calvarium grossly intact. Sinuses/Orbits: Globes and orbital soft tissues demonstrate no acute finding. Paranasal sinuses and mastoid air cells are clear. Other: None. IMPRESSION: 1. No acute intracranial abnormality. 2. Generalized age-related cerebral atrophy with moderate chronic microvascular ischemic disease. Electronically Signed   By: Jeannine Boga M.D.   On: 06/20/2019 01:00   DG Chest Port 1 View  Result Date: 06/19/2019 CLINICAL DATA:  Fever. Weakness. EXAM: PORTABLE CHEST 1 VIEW COMPARISON:  05/28/2019 FINDINGS: Mild cardiac enlargement is unchanged. Right lung clear. Mild asymmetric increased opacification within the  retrocardiac left lung base. Subsegmental atelectasis within the left midlung. No pleural effusion or edema. IMPRESSION: Left lung base opacity which may represent pneumonia. Electronically Signed   By: Kerby Moors M.D.   On: 06/19/2019 18:12        Scheduled Meds: . acidophilus  1 capsule Oral TID  . apixaban  5 mg Oral BID  . vitamin C  1,000 mg Oral Daily  . Chlorhexidine Gluconate Cloth  6 each Topical Daily  . cholecalciferol  1,000 Units Oral Daily  . diltiazem  60 mg Oral Q8H  . escitalopram  10 mg Oral Daily  . famotidine  20 mg Oral Daily  . metoprolol tartrate  100 mg Oral BID  . predniSONE  10 mg Oral Daily  . vancomycin  125 mg Oral BID  . vitamin E  400 Units Oral Daily   Continuous Infusions: . azithromycin Stopped (06/20/19 0516)  . cefTRIAXone (ROCEPHIN)  IV Stopped (06/19/19 2008)     Time spent: 35 minutes with over 50% of the time coordinating the patient's care    Harold Hedge, DO [facility] Pager [phone]  Call night coverage person covering after 7pm

## 2019-06-20 NOTE — ED Notes (Signed)
Pt alert but hard to understand. Pt non coherent. According to pts daughter this is not pts baseline. Will continue to monitor for improvement

## 2019-06-20 NOTE — ED Notes (Signed)
Paged hospitalist for throat spray per family and pt request.

## 2019-06-20 NOTE — ED Notes (Addendum)
Hospitalist at bedside 

## 2019-06-21 ENCOUNTER — Inpatient Hospital Stay (HOSPITAL_COMMUNITY): Payer: Medicare Other

## 2019-06-21 ENCOUNTER — Other Ambulatory Visit: Payer: Self-pay | Admitting: *Deleted

## 2019-06-21 DIAGNOSIS — M549 Dorsalgia, unspecified: Secondary | ICD-10-CM

## 2019-06-21 DIAGNOSIS — Z7189 Other specified counseling: Secondary | ICD-10-CM

## 2019-06-21 DIAGNOSIS — Z515 Encounter for palliative care: Secondary | ICD-10-CM

## 2019-06-21 DIAGNOSIS — G8929 Other chronic pain: Secondary | ICD-10-CM

## 2019-06-21 LAB — CBC
HCT: 34.8 % — ABNORMAL LOW (ref 36.0–46.0)
Hemoglobin: 10.7 g/dL — ABNORMAL LOW (ref 12.0–15.0)
MCH: 28.5 pg (ref 26.0–34.0)
MCHC: 30.7 g/dL (ref 30.0–36.0)
MCV: 92.8 fL (ref 80.0–100.0)
Platelets: 212 10*3/uL (ref 150–400)
RBC: 3.75 MIL/uL — ABNORMAL LOW (ref 3.87–5.11)
RDW: 15.4 % (ref 11.5–15.5)
WBC: 15.6 10*3/uL — ABNORMAL HIGH (ref 4.0–10.5)
nRBC: 0 % (ref 0.0–0.2)

## 2019-06-21 LAB — BASIC METABOLIC PANEL
Anion gap: 11 (ref 5–15)
BUN: 9 mg/dL (ref 8–23)
CO2: 24 mmol/L (ref 22–32)
Calcium: 8.1 mg/dL — ABNORMAL LOW (ref 8.9–10.3)
Chloride: 104 mmol/L (ref 98–111)
Creatinine, Ser: 0.67 mg/dL (ref 0.44–1.00)
GFR calc Af Amer: >60 mL/min (ref >60)
GFR calc non Af Amer: >60 mL/min (ref >60)
Glucose, Bld: 100 mg/dL — ABNORMAL HIGH (ref 70–99)
Potassium: 3.5 mmol/L (ref 3.5–5.1)
Sodium: 139 mmol/L (ref 135–145)

## 2019-06-21 LAB — C-REACTIVE PROTEIN: CRP: 42.3 mg/dL — ABNORMAL HIGH (ref <1.0)

## 2019-06-21 LAB — VITAMIN D 25 HYDROXY (VIT D DEFICIENCY, FRACTURES): Vit D, 25-Hydroxy: 31.89 ng/mL (ref 30–100)

## 2019-06-21 LAB — URINE CULTURE

## 2019-06-21 LAB — MAGNESIUM: Magnesium: 1.8 mg/dL (ref 1.7–2.4)

## 2019-06-21 LAB — SEDIMENTATION RATE: Sed Rate: 111 mm/hr — ABNORMAL HIGH (ref 0–22)

## 2019-06-21 MED ORDER — IBUPROFEN 200 MG PO TABS: 200.0000 mg | ORAL_TABLET | Freq: Four times a day (QID) | ORAL | Status: DC | PRN

## 2019-06-21 MED ORDER — METHYLPREDNISOLONE SODIUM SUCC 40 MG IJ SOLR
40.0000 mg | Freq: Once | INTRAMUSCULAR | Status: AC
  Administered 2019-06-21: 40 mg via INTRAVENOUS
  Filled 2019-06-21: qty 1

## 2019-06-21 MED ORDER — FENTANYL CITRATE (PF) 100 MCG/2ML IJ SOLN: 12.5000 ug | INTRAMUSCULAR | Status: DC | PRN

## 2019-06-21 MED ORDER — POTASSIUM CHLORIDE CRYS ER 20 MEQ PO TBCR
20.0000 meq | EXTENDED_RELEASE_TABLET | Freq: Once | ORAL | Status: AC
  Administered 2019-06-21: 20 meq via ORAL
  Filled 2019-06-21: qty 1

## 2019-06-21 NOTE — Patient Outreach (Signed)
[facility]) Care Management  06/21/2019  THEO POLLINS [birth year] [id number]   CSW made a third and final outreach attempt to patient's daughter, Solon Augusta [phone]) today, to try and perform the initial phone assessment on patient, as well as assess and assist with social work needs and services; however, Mrs. Betty Jackson was unavailable at the time of CSW's call.  CSW was unable to leave a HIPAA compliant message on voicemail for Mrs.Betty Jackson, receiving an automated recording that her mailbox is full and unable to accept any new messages at this time.  CSW has been unable to leave a HIPAA compliant message for Mrs. Betty Jackson with any of CSW's three call attempts.    CSW also tried calling Mrs. Betty Jackson at patient's home number [phone]), as well as patient's cell phone number (# [phone]), but again, without success.  CSW was able to leave a HIPAA compliant message for Mrs. Betty Jackson at patient's home number, requesting a return call at her earliest convenience if she is interested in receiving social work services for patient through [place] with [facility].  CSW explained in the message to Mrs. Betty Jackson that this is CSW's third call attempt and that CSW will be closing patient's case on Friday, June 23, 2019, if a return call is not received in the meantime.    In calling patient's cell phone number, CSW received an automated recording that the number has been disconnected or is no longer in service.  CSW actually tried calling the number two additional times to ensure that CSW was dialing the number correctly.  CSW noted that patient presented to the Emergency Department at [facility] on Tuesday, June 13, 2019, with complaints of acute on chronic lower back pain.  Patient was treated and released back home, that same day.  On Monday, June 19, 2019, patient presented to the Emergency Department at [facility] again, this time via EMS (Emergency Medical Services), with complaints of back pain.  Patient continues to remain hospitalized, reporting that her back pain is still a 7 out of 10.  Patient is febrile, with a temperature of 102.9 *F.  Chest X-ray is showing left lung base opacity suspicious for pneumonia.  Patient will continue to receive a course of intravenous antibiotics, pain medications and will be evaluated by physical therapy.  Patient is now considering hospice services and wishes to discuss ongoing pain management with the palliative care team.  Mrs. Betty Jackson continues to be encouraged, by inpatient hospital staff, to make contact with CSW, and has been provided with CSW's contact information.  CSW continues to await a return call.  Nat Christen, BSW, MSW, LCSW  Licensed Education officer, [facility]  Mailing [place] [address], [place], [place] [address] Physical Address-[address], [place], [place] [address] Toll Free Main # [phone] Fax # [phone] Cell # [phone]  Office # [phone] [email or web address]

## 2019-06-21 NOTE — TOC Initial Note (Signed)
Transition of Care [facility]) - Initial/Assessment Note    Patient Details  Name: Betty Jackson MRN: [id number] Date of Birth: 12-10-1934  Transition of [facility]) CM/SW Contact:    Lennart Pall, LCSW Phone Number: 06/21/2019, 4:33 PM  Clinical Narrative:    Met with pt and daughter to discuss d/c planning issues.  Per PT eval, recommendation for SNF.  Daughter states her primary goal is to "have an updated plan for pain control for my mother... it is affecting everything."  She is awaiting Palliative consult to discuss goals and pain management.  Pt and daughter are agreeable for SNF rehab, however, daughter feels that a new regimen for pain management needs to be established first.  Will follow up with them again tomorrow.               Expected Discharge Plan: Skilled Nursing Facility Barriers to Discharge: Continued Medical Work up   Patient Goals and CMS Choice Patient states their goals for this hospitalization and ongoing recovery are:: to get her pain under better control CMS Medicare.gov Compare Post Acute Care list provided to:: Patient Represenative (must comment)(daughter) Choice offered to / list presented to : Adult Children  Expected Discharge Plan and Services Expected Discharge Plan: [place] In-house Referral: Clinical Social Work, Hospice / [place] arrangements for the past 2 months: Single Family Home                                      Prior Living Arrangements/Services Living arrangements for the past 2 months: Single Family Home Lives with:: Spouse, Siblings Patient language and need for interpreter reviewed:: Yes Do you feel safe going back to the place where you live?: Yes      Need for Family Participation in Patient Care: Yes (Comment) Care giver support system in place?: Yes (comment)   Criminal Activity/Legal Involvement Pertinent to Current Situation/Hospitalization: No - Comment as needed  Activities of  Daily Living Home Assistive Devices/Equipment: Gilford Rile (specify type) ADL Screening (condition at time of admission) Patient's cognitive ability adequate to safely complete daily activities?: Yes Is the patient deaf or have difficulty hearing?: Yes Does the patient have difficulty seeing, even when wearing glasses/contacts?: No Does the patient have difficulty concentrating, remembering, or making decisions?: No Patient able to express need for assistance with ADLs?: Yes Does the patient have difficulty dressing or bathing?: No Independently performs ADLs?: Yes (appropriate for developmental age) Does the patient have difficulty walking or climbing stairs?: Yes Weakness of Legs: Both Weakness of Arms/Hands: None  Permission Sought/Granted Permission sought to share information with : Facility Sport and exercise psychologist, Tourist information centre manager, Family Supports Permission granted to share information with : Yes, Verbal Permission Granted  Share Information with NAME: Melissa     Permission granted to share info w Relationship: daughter     Emotional Assessment Appearance:: Appears stated age   Affect (typically observed): Flat Orientation: : Oriented to Self, Oriented to Place, Oriented to  Time, Oriented to Situation      Admission diagnosis:  HCAP (healthcare-associated pneumonia) [J18.9] Patient Active Problem List   Diagnosis Date Noted  . HCAP (healthcare-associated pneumonia) 06/19/2019  . Encephalopathy 06/19/2019  . Lumbar degenerative disc disease   . Paroxysmal A-fib ([place])   . Hypomagnesemia   . C. difficile colitis   . Diarrhea 05/29/2019  . CHF (congestive heart failure) ([place]) 05/29/2019  .  Recurrent falls 04/30/2019  . Pericardial effusion without cardiac tamponade 03/29/2019  . Hypoalbuminemia 03/27/2019  . Cardiac tamponade   . Pericarditis 03/06/2019  . Fever   . Septic shock ([place])   . Atypical chest pain   . Atrial fibrillation with rapid ventricular response ([place])    . Nausea 01/17/2019  . Acute diverticulitis 01/13/2019  . AKI (acute kidney injury) ([place]) 01/13/2019  . Chronic back pain 01/13/2019  . Tachycardia 01/13/2019  . Hypokalemia 01/13/2019  . Chronic low back pain 07/10/2018  . Influenza A 05/20/2017  . Sepsis ([place]) 05/20/2017  . Acute respiratory failure with hypoxia ([place]) 05/20/2017  . CKD (chronic kidney disease), stage III 05/20/2017  . PAF (paroxysmal atrial fibrillation) ([place]) 05/20/2017  . HTN (hypertension) 05/20/2017  . Giant cell arteritis ([place]) 05/20/2017  . Coronary disease 05/20/2017  . Abnormal urinalysis 05/20/2017  . Osteoporosis 05/08/2016  . Herniated nucleus pulposus, L5-S1, right 11/04/2015  . Major depressive disorder, recurrent episode, moderate ([place]) 07/16/2013  . Abdominal pain, epigastric 06/22/2013  . Lumbar stenosis 04/25/2013  . Chronic insomnia 02/07/2013  . Elevated transaminase level 02/07/2013  . CVA (cerebral infarction) 10/29/2012  . Visual changes 10/28/2012  . Depression 10/28/2012  . Anxiety state, unspecified 10/28/2012  . Anemia, unspecified 10/28/2012  . Nonspecific (abnormal) findings on radiological and other examination of gastrointestinal tract 01/25/2012  . Hyponatremia 01/24/2012  . Hematuria 01/24/2012  . Enteritis 12/24/2011  . Abdominal pain, other specified site 12/24/2011  . Leg pain, right 02/18/2011  . Temporal arteritis ([place]) 09/01/2010  . Hypertension 09/01/2010  . Hyperlipidemia 09/01/2010  . History of atrial fibrillation 09/01/2010  . CAD (coronary artery disease) 09/01/2010  . GERD (gastroesophageal reflux disease) 09/01/2010  . Insomnia 09/01/2010   PCP:  Eulas Post, MD Pharmacy:   [id number] [address]: [phone]: 3[place] [address]Phone: [phone]:  [phone]    Social Determinants of Health (SDOH) Interventions    Readmission Risk Interventions Readmission Risk Prevention Plan 06/21/2019 06/01/2019 04/21/2019  Transportation Screening Complete Complete Complete  Medication Review (RN Care Manager) Complete Complete Complete  PCP or Specialist appointment within 3-5 days of discharge Not Complete Complete Complete  PCP/Specialist Appt Not Complete comments likely SNF placement - -  [place] Complete Complete Complete  HRI or Home Care Consult Pt Refusal Comments likely SNF - -  SW Recovery Care/Counseling Consult Complete Complete Complete  Palliative Care Screening Complete - Not Applicable  Skilled Nursing Facility Complete Not Applicable Not Applicable  Some recent data might be hidden

## 2019-06-21 NOTE — Consult Note (Signed)
   [facility] CM Inpatient Consult   06/21/2019  LAMARR MCQUATE [birth year] [id number]   Patient chart has been reviewed for readmissions less than 30 days and for high risk score, 47%, for unplanned readmissions. Patient is currently active with [facility] ([place]) community Education officer, museum for assistance with community needs. [facility] CM social worker note reviewed.   Hospital liaison will continue to follow for progression and disposition plans.  Of note, [facility] Care Management services does not replace or interfere with any services that are arranged by inpatient case management or social work.  Netta Cedars, MSN, [facility] Liaison Nurse Mobile Phone [phone]  Toll free office [phone]

## 2019-06-21 NOTE — Progress Notes (Signed)
PROGRESS NOTE    Betty Jackson  SNK:[id number] DOB: 10-15-1934 DOA: 06/19/2019 PCP: Eulas Post, MD   Brief Narrative: 85 year old with past medical history significant for CAD status post PCI, chronic heart failure preserved ejection fraction, chronic A. fib, scoliosis, chronic back pain status post L2-3, L3-L4 posterior fusion in 2015 and L5-S1 micro discectomy in 2017, CKD stage III, CVA, hypertension, giant cell arteritis, RA on chronic steroid with recent hospitalization from 2/28 1-06/01/18/2021 for sepsis secondary to C. difficile colitis who presented to the ED for further evaluation of low back pain for which she has been taking hydrocodone increased dose recently.  Evaluation in the ED patient was found to be febrile with temperature 102, A. fib RVR heart rate up to the 160.  Oxygen saturation in the low mid 90s.  She presented with leukocytosis white count of 16, lactic acidosis, chest x-ray showed left lung base opacity suspicious for pneumonia.  She received Tylenol, IV Cardizem and and ceftriaxone and azithromycin.  Patient was admitted for sepsis, pneumonia worsening chronic back pain   Assessment & Plan:   Principal Problem:   HCAP (healthcare-associated pneumonia) Active Problems:   Sepsis ([place])   Chronic back pain   Atrial fibrillation with rapid ventricular response (HCC)   Encephalopathy  1-Sepsis suspected secondary to HAP; -X-ray with left lung base positive which may represent pneumonia. -Covid negative. -Continue with ceftriaxone and doxycycline. -Continue with oral vancomycin due to recent C. difficile infection -White blood cell increased to 15, from 13. lower than on admission.  2-A. fib with RVR: Oral Cardizem was increased.  Heart rate better controlled. Continue with metoprolol. Continue with Eliquis.  3-acute toxic metabolic encephalopathy Suspect related to underlying infection and opioid use. Improved. Delirium precaution  4-acute on  chronic back pain: Norco was resumed on 3/23 Patient is still experiencing back pain, will add IV fentanyl as needed. I will also give her a dose of IV Solu-Medrol Palliative care consulted for goals of care and pain management. PT OT eval Worse has gotten worse lately, worse buttock, back.  Will proceed with MRI thoracic, lumbar spine to rule out infection as well.  Will check ESR and CRP/  Prior Ischium fracture, repeat MRI. \  5-chronic heart failure preserved ejection fraction: Resume Lasix  6-Rheumatoid arthritis on chronic steroids Continue with prednisone  7-hypertension: Continue with Cardizem and metoprolol.  Continue to hold losartan    Estimated body mass index is 25.4 kg/m as calculated from the following:   Height as of 06/13/19: '5\' 4"'  (1.626 m).   Weight as of 06/13/19: 67.1 kg.   DVT prophylaxis: Eliquis Code Status: DNR Family Communication: Daughter updated at bedside Disposition Plan:  Patient is from: Home Anticipated d/c date: To be determined awaiting palliative care meeting Barriers to d/c or necessity for inpatient status: Patient requiring IV antibiotics and IV pain medication  Consultants:   Palliative care  Procedures:   None  Antimicrobials:    Subjective: Patient was alert and oriented this morning. She denies shortness of breath or chest pain. Patient is still complaining of back pain, 7 out of 10 Daughter would like for her mother to be able to go to a rehab facility  if possible. She is concern that her pain has been getting worse and is not well controlled.  Pain has gotten worse lately. Patient was having severe pain, for that reason patient came to the ED.   Objective: Vitals:   06/21/19 0327 06/21/19 0400 06/21/19 0500 06/21/19  0600  BP:  (!) 116/36 (!) 111/42 140/62  Pulse:  85 86 (!) 103  Resp:  (!) '23 19 17  ' Temp: 98.8 F (37.1 C)     TempSrc: Oral     SpO2:  90% 92% 97%    Intake/Output Summary (Last 24 hours) at  06/21/2019 0756 Last data filed at 06/21/2019 0600 Gross per 24 hour  Intake 340 ml  Output 400 ml  Net -60 ml   There were no vitals filed for this visit.  Examination:  General exam: Appears calm and comfortable  Respiratory system: Clear to auscultation.  Cardiovascular system: S1 & S2 heard, RRR. No JVD, murmurs, rubs, gallops or clicks. No pedal edema. Gastrointestinal system: Abdomen is nondistended, soft and nontender. No organomegaly or masses felt. Normal bowel sounds heard. Central nervous system: Alert and oriented.  Extremities: no edema.  Skin: No rashes, lesions or ulcers   Data Reviewed: I have personally reviewed following labs and imaging studies  CBC: Recent Labs  Lab 06/19/19 1714 06/20/19 1601 06/21/19 0302  WBC 16.4* 13.4* 15.6*  NEUTROABS 12.7*  --   --   HGB 11.8* 9.9* 10.7*  HCT 38.7 32.5* 34.8*  MCV 93.9 93.1 92.8  PLT 232 225 251   Basic Metabolic Panel: Recent Labs  Lab 06/19/19 1714 06/19/19 1722 06/21/19 0302  NA 140  --  139  K 3.6  --  3.5  CL 99  --  104  CO2 26  --  24  GLUCOSE 135*  --  100*  BUN 13  --  9  CREATININE 0.80  --  0.67  CALCIUM 8.9  --  8.1*  MG  --  2.2 1.8   GFR: Estimated Creatinine Clearance: 51.1 mL/min (by C-G formula based on SCr of 0.67 mg/dL). Liver Function Tests: Recent Labs  Lab 06/19/19 1714  AST 19  ALT 14  ALKPHOS 111  BILITOT 1.3*  PROT 7.3  ALBUMIN 3.4*   No results for input(s): LIPASE, AMYLASE in the last 168 hours. No results for input(s): AMMONIA in the last 168 hours. Coagulation Profile: No results for input(s): INR, PROTIME in the last 168 hours. Cardiac Enzymes: No results for input(s): CKTOTAL, CKMB, CKMBINDEX, TROPONINI in the last 168 hours. BNP (last 3 results) No results for input(s): PROBNP in the last 8760 hours. HbA1C: No results for input(s): HGBA1C in the last 72 hours. CBG: Recent Labs  Lab 06/20/19 0503  GLUCAP 123*   Lipid Profile: No results for  input(s): CHOL, HDL, LDLCALC, TRIG, CHOLHDL, LDLDIRECT in the last 72 hours. Thyroid Function Tests: No results for input(s): TSH, T4TOTAL, FREET4, T3FREE, THYROIDAB in the last 72 hours. Anemia Panel: No results for input(s): VITAMINB12, FOLATE, FERRITIN, TIBC, IRON, RETICCTPCT in the last 72 hours. Sepsis Labs: Recent Labs  Lab 06/19/19 1721 06/19/19 1722 06/19/19 2005 06/20/19 0106  PROCALCITON  --  0.36  --   --   LATICACIDVEN 3.2*  --  3.0* 1.9    Recent Results (from the past 240 hour(s))  Urine culture     Status: Abnormal   Collection Time: 06/19/19  5:14 PM   Specimen: In/Out Cath Urine  Result Value Ref Range Status   Specimen Description   Final    IN/OUT CATH URINE Performed at [facility], [place] [address] [place], [place] [address]    Special Requests   Final    NONE Performed at [facility], [place] [address] [place], [place] [address]  Culture MULTIPLE ORGANISMS PRESENT, NONE PREDOMINANT (A)  Final   Report Status 06/21/2019 FINAL  Final  Culture, blood (routine x 2)     Status: None (Preliminary result)   Collection Time: 06/19/19  5:15 PM   Specimen: BLOOD  Result Value Ref Range Status   Specimen Description   Final    BLOOD RIGHT WRIST Performed at [facility] [address] [place], [place] [address]    Special Requests   Final    BOTTLES DRAWN AEROBIC AND ANAEROBIC Blood Culture adequate volume Performed at [facility] [address], [place], [place] [address]    Culture   Final    NO GROWTH 2 DAYS Performed at [facility] [address], [place], [place] [address]    Report Status PENDING  Incomplete  Culture, blood (routine x 2)     Status: None (Preliminary result)   Collection Time: 06/19/19  5:26 PM   Specimen: BLOOD  Result Value Ref Range Status   Specimen Description   Final    BLOOD LEFT ANTECUBITAL Performed at [facility] [address] [place], [place] [address]    Special Requests   Final    BOTTLES DRAWN AEROBIC ONLY Blood Culture results may not be optimal due to an inadequate volume of blood received in culture bottles Performed at [facility] [address], [place], [place] [address]    Culture   Final    NO GROWTH 2 DAYS Performed at [facility] [address], [place], [place] [address]    Report Status PENDING  Incomplete  SARS CORONAVIRUS 2 (TAT 6-24 HRS) Nasopharyngeal Nasopharyngeal Swab     Status: None   Collection Time: 06/20/19  1:06 AM   Specimen: Nasopharyngeal Swab  Result Value Ref Range Status   SARS Coronavirus 2 NEGATIVE NEGATIVE Final    Comment: (NOTE) SARS-CoV-2 target nucleic acids are NOT DETECTED. The SARS-CoV-2 RNA is generally detectable in upper and lower respiratory specimens during the acute phase of infection. Negative results do not preclude SARS-CoV-2 infection, do not rule out co-infections with other pathogens, and should not be used as the sole basis for treatment or other patient management decisions. Negative results must be combined with clinical observations, patient history, and epidemiological information. The expected result is Negative. Fact Sheet for Patients: SugarRoll.be Fact Sheet for Healthcare Providers: https://www.woods-mathews.com/ This test is not yet approved or cleared by the [place] FDA and  has been authorized for detection and/or diagnosis of SARS-CoV-2 by FDA under an Emergency Use Authorization (EUA). This EUA will remain  in effect (meaning this test can be used) for the duration of the COVID-19 declaration under Section 56 4(b)(1) of the Act, 21 U.S.C. section 360bbb-3(b)(1), unless the authorization is terminated or revoked sooner. Performed at [facility], [place] [address], [place],  [address]   MRSA PCR Screening     Status: None    Collection Time: 06/20/19  4:00 PM   Specimen: Nasal Mucosa; Nasopharyngeal  Result Value Ref Range Status   MRSA by PCR NEGATIVE NEGATIVE Final    Comment:        The GeneXpert MRSA Assay (FDA approved for NASAL specimens only), is one component of a comprehensive MRSA colonization surveillance program. It is not intended to diagnose MRSA infection nor to guide or monitor treatment for MRSA infections. Performed at [facility], [address] [place], [place]  [address]          Radiology Studies: DG Lumbar Spine 2-3 Views  Result Date: 06/20/2019 CLINICAL DATA:  Chronic lower back pain. EXAM: LUMBAR SPINE - 2-3 VIEW COMPARISON:  Aug 17, 2018 FINDINGS: There is no evidence of an acute lumbar spine fracture. Chronic fracture deformities, with subsequent vertebroplasty, are seen at the levels of T11 and L1. Mild levoscoliosis of the lumbar spine is seen. Bilateral radiopaque pedicle screws are seen at the levels of L2, L3 and L4 with radiopaque operative material seen within the L2-L3 and L3-L4 intervertebral disc spaces. Marked severity endplate sclerosis is seen at the levels of L4-L5 and L5-S1. Moderate severity intervertebral disc space narrowing is also seen at these levels. A total right hip replacement is seen. Radiopaque surgical clips are seen overlying the right upper quadrant. There is marked severity calcification of the abdominal aorta. IMPRESSION: 1. Chronic fracture deformities with subsequent vertebroplasty at T11 and L1. 2. Bilateral radiopaque pedicle screws at L2, L3 and L4 with radiopaque operative material seen within the L2-L3 and L3-L4 intervertebral disc spaces. 3. Marked severity endplate sclerosis at X3-K4 and L5-S1. Electronically Signed   By: Virgina Norfolk M.D.   On: 06/20/2019 19:21   CT HEAD WO CONTRAST  Result Date: 06/20/2019 CLINICAL DATA:  Initial evaluation for acute encephalopathy. EXAM: CT HEAD WITHOUT CONTRAST TECHNIQUE:  Contiguous axial images were obtained from the base of the skull through the vertex without intravenous contrast. COMPARISON:  Prior head CT from 02/11/2019 FINDINGS: Brain: Examination degraded by motion artifact. Generalized age-related cerebral atrophy with moderate chronic small vessel ischemic disease. No acute intracranial hemorrhage. No acute large vessel territory infarct. No mass lesion, midline shift or mass effect. No hydrocephalus. No extra-axial fluid collection. Vascular: No hyperdense vessel. Calcified atherosclerosis present at skull base. Skull: Scalp soft tissues within normal limits. Calvarium grossly intact. Sinuses/Orbits: Globes and orbital soft tissues demonstrate no acute finding. Paranasal sinuses and mastoid air cells are clear. Other: None. IMPRESSION: 1. No acute intracranial abnormality. 2. Generalized age-related cerebral atrophy with moderate chronic microvascular ischemic disease. Electronically Signed   By: Jeannine Boga M.D.   On: 06/20/2019 01:00   DG Chest Port 1 View  Result Date: 06/19/2019 CLINICAL DATA:  Fever. Weakness. EXAM: PORTABLE CHEST 1 VIEW COMPARISON:  05/28/2019 FINDINGS: Mild cardiac enlargement is unchanged. Right lung clear. Mild asymmetric increased opacification within the retrocardiac left lung base. Subsegmental atelectasis within the left midlung. No pleural effusion or edema. IMPRESSION: Left lung base opacity which may represent pneumonia. Electronically Signed   By: Kerby Moors M.D.   On: 06/19/2019 18:12        Scheduled Meds: . acidophilus  1 capsule Oral TID  . apixaban  5 mg Oral BID  . vitamin C  1,000 mg Oral Daily  . Chlorhexidine Gluconate Cloth  6 each Topical Daily  . cholecalciferol  1,000 Units Oral Daily  . diltiazem  90 mg Oral Q8H  . doxycycline  100 mg Oral Q12H  . escitalopram  10 mg Oral Daily  . famotidine  20 mg Oral Daily  . furosemide  40 mg Oral Daily  . mouth rinse  15 mL Mouth Rinse BID  .  metoprolol tartrate  100 mg Oral BID  . predniSONE  10 mg Oral Daily  . vancomycin  125 mg Oral BID  . vitamin E  400 Units Oral Daily   Continuous Infusions: . cefTRIAXone (ROCEPHIN)  IV Stopped (06/20/19 1956)  LOS: 2 days    Time spent: 35 minutes.     Elmarie Shiley, MD [facility]   If 7PM-7AM, please contact night-coverage [email or web address]  06/21/2019, 7:56 AM

## 2019-06-21 NOTE — Progress Notes (Signed)
Patient reported 7/10 back pain to this RN. This RN offered available PRN medication, ice, heat, and repositioning to the patient. The patient agreed to pain medicine and ice. After giving tramadol PO and applying ice pack, the patient wanted to be repositioned further to the left side. This RN assisted her and upon moving one pillow, the patient became very agitated and told this RN to "Stop everything don't help me." This RN offered to again help the patient get on her side to assist with back pain and patient again refused, refused mobility completely, and did not want help with her breakfast. Will continue to monitor and assist as able.

## 2019-06-21 NOTE — Consult Note (Signed)
Palliative Care Consult Note  Reason for consult:  Pain management and goals of care  I met today with Betty Jackson and her daughter.  She is confused today and I met with her daughter outside of her room.  Her daughter relays that her mother has been suffering from functional decline secondary to worsening of her back pain.  We discussed her decrease in functional status over the last few weeks.  She follows with outpatient pain management group and medications were recently increased.  Despite this, her pain became out of control and she presented to the hospital.   We discussed clinical course as well as wishes moving forward in regard to advanced directives.  Values and goals of care important to patient and family were attempted to be elicited.  Concept of Hospice and Palliative Care were discussed.  Her daughter reports that her mother had previously been evaluated by [facility] but it was determined not to be a good fit at that time.  She still feels that her mother would benefit from further rehab and understands that this is not covered with hospice services.  Questions and concerns addressed.   PMT will continue to support holistically.  - Continue current pain regimen with hydrocodone and breakthrough fentanyl if hydrocodone is ineffective. - Await results of MRI.  Reported to have acute worsening of pain recently.  As goal is not for comfort focus, agree with imaging to determine if acute change in pain is due to new pathology. - Betty Jackson currently follows with Dr. Donell Sievert office for pain management.  She has been seen most recently by Leonie Green, NP.  I will try to reach out to discuss further with her once results of MRI are available. - Her daughter believes that Betty Jackson is not likely at a point where her goals would be for strict comfort/hospice services.  While she certainly would be appropriate to be followed by outpatient palliative care, I am not sure that outpatient  palliative programs would consider taking over management of chronic back pain in non-hospice patient. - Currently Betty Jackson is confused following higher dose of steroids.  Her daughter is hopeful that she will be able to participate in conversation regarding goals.   - Will plan to follow-up tomorrow.  Time in: 1700 Time out 1820  TOTAL TIME: 80 min  Greater than 50%  of this time was spent counseling and coordinating care related to the above assessment and plan.  Micheline Rough, MD [facility] [phone]

## 2019-06-21 NOTE — Evaluation (Addendum)
Physical Therapy Evaluation Patient Details Name: Betty Jackson MRN: [id number] DOB: October 29, 1934 Today's Date: 06/21/2019   History of Present Illness  This is an 85 year old female with history of CAD status post PCI, chronic HFpEF, chronic atrial fibrillation, scoliosis, chronic low back pain status post L2-3, L3-4 posterior fusion in 2015 and L5-S1 microdiscectomy in 2017, CKD 3, CVA, hypertension, hyperlipidemia, giant cell arteritis, RA on chronic steroids with recent hospital admission 2/28 06/01/2019 for sepsis secondary to C. difficile colitis who presented to the ED 06/19/10 for further evaluation of low back pain .  Clinical Impression  The patient received being very drowsy, had premedication for pain. Patient's daughter present to provide information. Per daughter, patient has declined and is unable to ambulate recently. Patient was in hospital 2 weeks ago. Patient lives with elderly spouse and sister who are unable to assist.  Patient did stand with 2 max assist briefly. Per daughter, SNF will be needed for attempts to recover to a functional level. Pt admitted with above diagnosis.  Pt currently with functional limitations due to the deficits listed below (see PT Problem List). Pt will benefit from skilled PT to increase their independence and safety with mobility to allow discharge to the venue listed below.       Follow Up Recommendations SNF;Supervision/Assistance - 24 hour    Equipment Recommendations  None recommended by PT    Recommendations for Other Services       Precautions / Restrictions Precautions Precautions: Fall Precaution Comments: pain all over when moved, Restrictions Weight Bearing Restrictions: No      Mobility  Bed Mobility   Bed Mobility: rolling; Supine to Sit;Sit to Supine  Multimodal cue for leg and UE position to roll to each side, using bed  Pad to assist. Patient indicates right hip pain when on right side.    Supine to sit: Max assist;+2 for  physical assistance;+2 for safety/equipment;HOB elevated Sit to supine: +2 for physical assistance;+2 for safety/equipment;Total assist   General bed mobility comments: assist with legs and trunk t, using  bed pad to scoot, patient did  attempt to scoot forward a few tries.  Transfers Overall transfer level: Needs assistance Equipment used: Rolling walker (2 wheeled) Transfers: Sit to/from Stand Sit to Stand: Total assist;+2 physical assistance;+2 safety/equipment         General transfer comment: heavy lifting assistance of 2 to stand from bed, cues to increase erctness of trunk. stood ~ 30 seconds  Ambulation/Gait                Stairs            Wheelchair Mobility    Modified Rankin (Stroke Patients Only)       Balance Overall balance assessment: Needs assistance Sitting-balance support: Bilateral upper extremity supported;Feet supported Sitting balance-[name]: Poor     Standing balance support: Bilateral upper extremity supported;During functional activity Standing balance-[name]: Poor Standing balance comment: Reliant on BUE and external support                             Pertinent Vitals/Pain      Home Living Family/patient expects to be discharged to:: Private residence Living Arrangements: Spouse/significant other;Other relatives;Children Available Help at Discharge: Family;Available 24 hours/day Type of Home: House Home Access: Ramped entrance     Home Layout: One level Home Equipment: Walker - 4 wheels;Bedside commode;Shower seat;Grab bars - tub/shower;Wheelchair - manual  Prior Function Level of Independence: Needs assistance   Gait / Transfers Assistance Needed: per daughter, patient was ambulating with Rw until a few days ago  ADL's / Homemaking Assistance Needed: Requires assist for ADL tasks.         Hand Dominance        Extremity/Trunk Assessment        Lower Extremity Assessment Lower  Extremity Assessment: Generalized weakness    Cervical / Trunk Assessment Cervical / Trunk Assessment: Kyphotic  Communication      Cognition Arousal/Alertness: Awake/alert Behavior During Therapy: WFL for tasks assessed/performed Overall Cognitive Status: Impaired/Different from baseline Area of Impairment: Following commands;Attention;Awareness                   Current Attention Level: Focused   Following Commands: Follows one step commands inconsistently       General Comments: requires frequent cues to stay aroused      General Comments      Exercises     Assessment/Plan    PT Assessment Patient needs continued PT services  PT Problem List Decreased strength;Decreased range of motion;Decreased activity tolerance;Decreased balance;Decreased mobility;Decreased cognition;Decreased knowledge of use of DME;Decreased safety awareness;Decreased knowledge of precautions;Pain       PT Treatment Interventions Functional mobility training;Therapeutic activities;Therapeutic exercise;DME instruction;Balance training;Cognitive remediation;Patient/family education    PT Goals (Current goals can be found in the Care Plan section)  Acute Rehab PT Goals Patient Stated Goal: to be able to walk PT Goal Formulation: With patient/family Time For Goal Achievement: 07/05/19 Potential to Achieve Goals: Fair    Frequency Min 2X/week   Barriers to discharge        Co-evaluation               AM-PAC PT "6 Clicks" Mobility  Outcome Measure Help needed turning from your back to your side while in a flat bed without using bedrails?: Total Help needed moving from lying on your back to sitting on the side of a flat bed without using bedrails?: Total Help needed moving to and from a bed to a chair (including a wheelchair)?: Total Help needed standing up from a chair using your arms (e.g., wheelchair or bedside chair)?: Total Help needed to walk in hospital room?: Total Help  needed climbing 3-5 steps with a railing? : Total 6 Click Score: 6    End of Session Equipment Utilized During Treatment: Gait belt Activity Tolerance: No increased pain;Patient limited by fatigue Patient left: in bed;with call bell/phone within reach;with nursing/sitter in room;with family/visitor present;with bed alarm set Nurse Communication: Mobility status PT Visit Diagnosis: Unsteadiness on feet (R26.81);Muscle weakness (generalized) (M62.81);Pain Pain - Right/Left: Right Pain - part of body: Hip    Time: [id number] PT Time Calculation (min) (ACUTE ONLY): 38 min   Charges:   PT Evaluation $PT Eval Moderate Complexity: [place] Pager [phone] Office [phone]   Claretha Cooper 06/21/2019, 1:41 PM

## 2019-06-21 NOTE — Progress Notes (Signed)
Occupational Therapy Evaluation  Clinical Impression PTA Daughter was able to provide PLOF information due to pt's cognitive deficits and increase level of pain. Pt required assistance with all self-care tasks and functional mobility at PLOF with increasing level of assist over the last week. Pt has been bed bound for the last 6 days. Overall pt requires Max to Total assist for self-care tasks and total assist for functional mobility. Pt presents with increased pain in B hands, shoulders and R LE resulting in decreased mobility, decreased B UE AROM, and increase in self-care assistance.  Pt will benefit from continued skilled acute OT services with recommendation for SNF upon d/c.     06/21/19 1623  OT Visit Information  Assistance Needed +2  PT/OT/SLP Co-Evaluation/Treatment Yes  Reason for Co-Treatment For patient/therapist safety;To address functional/ADL transfers  PT goals addressed during session Mobility/safety with mobility;Balance;Proper use of DME  OT goals addressed during session ADL's and self-care  History of Present Illness This is an 85 year old female with history of CAD status post PCI, chronic HFpEF, chronic atrial fibrillation, scoliosis, chronic low back pain status post L2-3, L3-4 posterior fusion in 2015 and L5-S1 microdiscectomy in 2017, CKD 3, CVA, hypertension, hyperlipidemia, giant cell arteritis, RA on chronic steroids with recent hospital admission 2/28 06/01/2019 for sepsis secondary to C. difficile colitis who presented to the ED 06/19/10 for further evaluation of low back pain .  Precautions  Precautions Fall  Precaution Comments pain all over when moved,  Restrictions  Weight Bearing Restrictions No  Home Living  Family/patient expects to be discharged to: Private residence  Living Arrangements Spouse/significant other;Other relatives;Children  Available Help at Discharge Family;Available 24 hours/day  Type of [place]  One level  Bathroom Shower/Tub Walk-in shower  Bathroom Toilet Handicapped height  Bathroom Accessibility Yes  How Accessible Accessible via [place] - 4 wheels;BSC;Shower seat;Grab bars - tub/shower;Wheelchair - manual  Additional Comments Has an aide that comes 2 days/week to assist with bathing.   Prior Function  Level of [place] Needs assistance  Gait / Transfers Assistance Needed per daughter, patient was ambulating with Rw until a few days ago  ADL's / [place] assist for ADL tasks.   Communication  Communication HOH  Pain Assessment  Pain Assessment No/denies pain  Cognition  Arousal/Alertness Awake/alert  Behavior During Therapy WFL for tasks assessed/performed  Overall Cognitive Status Impaired/Different from baseline  Area of Impairment Following commands;Attention;Awareness  Current Attention Level Focused  Following Commands Follows one step commands inconsistently  Upper Extremity Assessment  Upper Extremity Assessment RUE deficits/detail;LUE deficits/detail  RUE  (Grossly 3+/5, AROM WFL )  RUE Coordination decreased gross motor;decreased fine motor  LUE  (Grossly 3-/5, decreased shoulder AROM to about 30 degrees)  LUE Coordination decreased gross motor;decreased fine motor  Lower Extremity Assessment  Lower Extremity Assessment Defer to PT evaluation  ADL  Overall ADL's  Needs assistance/impaired  Eating/Feeding Maximal assistance  Grooming Maximal assistance  Upper Body Bathing Maximal assistance  Lower Body Bathing Maximal assistance  Upper Body Dressing  Moderate assistance  Lower Body Dressing Maximal assistance  Toilet Transfer Total assistance  Toileting- Clothing Manipulation and Hygiene Total assistance  Tub/ Shower Transfer Total assistance  Functional mobility during ADLs Maximal assistance;+2 for physical assistance;+2 for safety/equipment  Vision- History  Baseline Vision/History No visual  deficits  Bed Mobility  Overal bed mobility Needs Assistance  Supine to sit Max assist;+2 for physical assistance;+2  for safety/equipment;HOB elevated  Sit to supine +2 for physical assistance;+2 for safety/equipment;Total assist  Transfers  Overall transfer level Needs assistance  Equipment used Rolling walker (2 wheeled)  Sit to Stand Total assist;+2 physical assistance;+2 safety/equipment  Stand pivot transfers Total assist;+2 physical assistance;+2 safety/equipment  Balance  Sitting-balance support Bilateral upper extremity supported;Feet supported  Sitting balance-[name] Poor  Standing balance support Bilateral upper extremity supported;During functional activity  Standing balance-[name] Poor  OT - End of Session  Equipment Utilized During Treatment Gait belt;Rolling walker  Activity Tolerance Patient tolerated treatment well  Patient left in bed;with bed alarm set;with call bell/phone within reach;with family/visitor present  Nurse Communication Mobility status  OT Assessment  OT Recommendation/Assessment Patient needs continued OT Services  OT Visit Diagnosis Muscle weakness (generalized) (M62.81);Unsteadiness on feet (R26.81)  OT Problem List Decreased strength;Decreased activity tolerance;Pain;Impaired balance (sitting and/or standing);Decreased safety awareness;Decreased knowledge of use of DME or AE;Decreased coordination;Decreased range of motion  OT Plan  OT Frequency (ACUTE ONLY) Min 2X/week  OT Treatment/Interventions (ACUTE ONLY) Self-care/ADL training;Therapeutic exercise;Balance training;Patient/family education;Therapeutic activities;DME and/or AE instruction;Energy conservation  AM-PAC OT "6 Clicks" Daily Activity Outcome Measure (Version 2)  Help from another person eating meals? 2  Help from another person taking care of personal grooming? 2  Help from another person toileting, which includes using toliet, bedpan, or urinal? 1  Help from another person  bathing (including washing, rinsing, drying)? 2  Help from another person to put on and taking off regular upper body clothing? 2  Help from another person to put on and taking off regular lower body clothing? 1  6 Click Score 10  OT Recommendation  Follow Up Recommendations SNF;Supervision/Assistance - 24 hour  [facility] bed  Individuals Consulted  Consulted and Agree with Results and Recommendations Patient  Acute Rehab OT Goals  Patient Stated Goal to be able to walk  OT Goal Formulation With patient  Time For Goal Achievement 07/05/19  Potential to Achieve Goals Fair  OT Time Calculation  OT Start Time (ACUTE ONLY) 0930  OT Stop Time (ACUTE ONLY) 1017  OT Time Calculation (min) 47 min  OT General Charges  $OT Visit 1 Visit  OT Evaluation  $OT Eval Moderate Complexity 1 Mod  OT Treatments  $Self Care/Home Management  8-22 mins  Written Expression  Dominant Hand Right  Kye Hedden OTR/L

## 2019-06-22 LAB — CBC
HCT: 32.2 % — ABNORMAL LOW (ref 36.0–46.0)
Hemoglobin: 10.1 g/dL — ABNORMAL LOW (ref 12.0–15.0)
MCH: 28.5 pg (ref 26.0–34.0)
MCHC: 31.4 g/dL (ref 30.0–36.0)
MCV: 91 fL (ref 80.0–100.0)
Platelets: 242 10*3/uL (ref 150–400)
RBC: 3.54 MIL/uL — ABNORMAL LOW (ref 3.87–5.11)
RDW: 14.6 % (ref 11.5–15.5)
WBC: 13.6 10*3/uL — ABNORMAL HIGH (ref 4.0–10.5)
nRBC: 0 % (ref 0.0–0.2)

## 2019-06-22 MED ORDER — FENTANYL 12 MCG/HR TD PT72
1.0000 | MEDICATED_PATCH | TRANSDERMAL | Status: DC
  Administered 2019-06-22: 1 via TRANSDERMAL
  Filled 2019-06-22: qty 1

## 2019-06-22 MED ORDER — PREDNISONE 20 MG PO TABS: 20.0000 mg | ORAL_TABLET | Freq: Every day | ORAL | Status: DC

## 2019-06-22 NOTE — Progress Notes (Signed)
Physical Therapy Treatment Patient Details Name: Betty Jackson MRN: [id number] DOB: 1935-02-12 Today's Date: 06/22/2019    History of Present Illness This is an 85 year old female with history of CAD status post PCI, chronic HFpEF, chronic atrial fibrillation, scoliosis, chronic low back pain status post L2-3, L3-4 posterior fusion in 2015 and L5-S1 microdiscectomy in 2017, CKD 3, CVA, hypertension, hyperlipidemia, giant cell arteritis, RA on chronic steroids with recent hospital admission 2/28 06/01/2019 for sepsis secondary to C. difficile colitis who presented to the ED 06/19/10 for further evaluation of low back pain .    PT Comments    The patient is improved with mobility, Able to sit up and return to supine without assistance. Patientdid stand and take a few steps, not ready for any distance walking.   Daughter present and taking vidoe on phone the session. RN aware.    Follow Up Recommendations  SNF;Supervision/Assistance - 24 hour     Equipment Recommendations  None recommended by PT    Recommendations for Other Services       Precautions / Restrictions Precautions Precautions: Fall    Mobility  Bed Mobility Overal bed mobility: Needs Assistance Bed Mobility: Supine to Sit;Sit to Supine     Supine to sit: Min assist Sit to supine: Min assist   General bed mobility comments: patient scooted self to bed edge and then able to return to supine without physical assistance.  Transfers Overall transfer level: Needs assistance Equipment used: Rolling walker (2 wheeled) Transfers: Sit to/from Stand Sit to Stand: Mod assist         General transfer comment: steady assist to stand from low bed x 2 at RW.  Ambulation/Gait Ambulation/Gait assistance: Mod assist;+2 physical assistance;+2 safety/equipment Gait Distance (Feet): 4 Feet Assistive device: Rolling walker (2 wheeled) Gait Pattern/deviations: Step-to pattern;Trunk flexed Gait velocity: decr   General Gait  Details: patient took 4 steps forward and back, reporting legs felt very weak, patient then took 5 sidesteps along bed edge toward head.   Stairs             Wheelchair Mobility    Modified Rankin (Stroke Patients Only)       Balance Overall balance assessment: Needs assistance Sitting-balance support: Bilateral upper extremity supported;Feet supported Sitting balance-[name]: Fair     Standing balance support: Bilateral upper extremity supported;During functional activity Standing balance-[name]: Fair                              Cognition Arousal/Alertness: Awake/alert Behavior During Therapy: WFL for tasks assessed/performed                                   General Comments: pt oriented x 3, follows directions readily      Exercises      General Comments        Pertinent Vitals/Pain Pain Assessment: 0-10 Pain Score: 6  Pain Location: right hip and back Pain Intervention(s): Premedicated before session    Home Living                      Prior Function            PT Goals (current goals can now be found in the care plan section) Progress towards PT goals: Progressing toward goals    Frequency    Min 2X/week  PT Plan Current plan remains appropriate    Co-evaluation              AM-PAC PT "6 Clicks" Mobility   Outcome Measure  Help needed turning from your back to your side while in a flat bed without using bedrails?: Total Help needed moving from lying on your back to sitting on the side of a flat bed without using bedrails?: Total Help needed moving to and from a bed to a chair (including a wheelchair)?: Total Help needed standing up from a chair using your arms (e.g., wheelchair or bedside chair)?: Total Help needed to walk in hospital room?: Total Help needed climbing 3-5 steps with a railing? : Total 6 Click Score: 6    End of Session Equipment Utilized During Treatment:  Gait belt Activity Tolerance: Patient tolerated treatment well Patient left: in bed;with call bell/phone within reach;with family/visitor present;with bed alarm set Nurse Communication: Mobility status PT Visit Diagnosis: Unsteadiness on feet (R26.81);Muscle weakness (generalized) (M62.81);Pain Pain - Right/Left: Right Pain - part of body: Hip     Time: [id number] PT Time Calculation (min) (ACUTE ONLY): 20 min  Charges:  $Therapeutic Activity: 8-22 mins                     Tresa Endo PT Acute Rehabilitation Services Pager [phone] Office [phone]    Claretha Cooper 06/22/2019, 4:29 PM

## 2019-06-22 NOTE — Progress Notes (Signed)
PROGRESS NOTE    Betty Jackson  GXQ:[id number] DOB: 1935-03-04 DOA: 06/19/2019 PCP: Eulas Post, MD   Brief Narrative: 85 year old with past medical history significant for CAD status post PCI, chronic heart failure preserved ejection fraction, chronic A. fib, scoliosis, chronic back pain status post L2-3, L3-L4 posterior fusion in 2015 and L5-S1 micro discectomy in 2017, CKD stage III, CVA, hypertension, giant cell arteritis, RA on chronic steroid with recent hospitalization from 2/28 1-06/01/18/2021 for sepsis secondary to C. difficile colitis who presented to the ED for further evaluation of low back pain for which she has been taking hydrocodone increased dose recently.  Evaluation in the ED patient was found to be febrile with temperature 102, A. fib RVR heart rate up to the 160.  Oxygen saturation in the low mid 90s.  She presented with leukocytosis white count of 16, lactic acidosis, chest x-ray showed left lung base opacity suspicious for pneumonia.  She received Tylenol, IV Cardizem and and ceftriaxone and azithromycin.  Patient was admitted for sepsis, pneumonia worsening chronic back pain   Assessment & Plan:   Principal Problem:   HCAP (healthcare-associated pneumonia) Active Problems:   Sepsis ([place])   Chronic back pain   Atrial fibrillation with rapid ventricular response (HCC)   Encephalopathy  1-Sepsis suspected secondary to HAP; -X-ray with left lung base positive which may represent pneumonia. -Covid negative. -Continue with ceftriaxone and doxycycline. -Continue with oral vancomycin due to recent C. difficile infection -WBC stable.  -day 4 antibiotics.   2-A. fib with RVR: Oral Cardizem was increased.  Heart rate better controlled. Continue with metoprolol. Continue with Eliquis.  3-acute toxic metabolic encephalopathy Suspect related to underlying infection and opioid use. MS Fluctuates. Worse in the afternoon. She was alert and oriented this morning.  Daughter report episode of hallucination this afternoon.  Delirium precaution.   4-acute on chronic back pain: Norco was resumed on 3/23. Continue with tramadol. See how she does with PT today. Increase prednisone to 20 mg.  Received a dose IV Solu-Medrol. Got more confuse after.  Palliative care consulted for goals of care and pain management. PT OT eval ESR and CRP/ elevated. MRI was negative for infection.  Prior Ischium fracture, repeat MRI. \MRI lumbar and thoracic spine stable finding negative for infection.  Pelvis MRI with Partial tear left gluteus Minimus and medius tendons with myotendinous muscular high grade strain.  Discussed with Dr Terrence Dupont Pelvis MRI result; recommend pain management, weight baring as tolerated, cushion donut. Could consider steroid injection to left ischial tuberosity if patient having pain on that site. Patient denies significant pain on the left -She will need follow-up CRP and ESR.  5-Chronic heart failure preserved ejection fraction: Continue with  Lasix  6-Rheumatoid arthritis on chronic steroids Continue with prednisone  7-hypertension: Continue with Cardizem and metoprolol.  Continue to hold losartan    Estimated body mass index is 26.71 kg/m as calculated from the following:   Height as of this encounter: 5' 4.5" (1.638 m).   Weight as of this encounter: 71.7 kg.   DVT prophylaxis: Eliquis Code Status: DNR Family Communication: Daughter updated at bedside Disposition Plan:  Patient is from: Home Anticipated d/c date: To be determined awaiting palliative care meeting Barriers to d/c or necessity for inpatient status: Patient requiring IV antibiotics and IV pain medication  Consultants:   Palliative care  Procedures:   None  Antimicrobials:    Subjective: Patient seen this morning.  She was alert and oriented x3, she was  able to tell me why she was in the hospital. She reports that her pain comes ago, when it comes back is 8 out  of 10.  Comes and go every 6 hours. She is willing to work with physical therapist today. Denies diarrhea.  Objective: Vitals:   06/21/19 2211 06/22/19 0213 06/22/19 0545 06/22/19 1401  BP: (!) 101/59 (!) 115/55 127/61 (!) 117/58  Pulse: 62 67 65 65  Resp: '19 16 16 16  ' Temp: 97.7 F (36.5 C) 97.8 F (36.6 C) (!) 97.5 F (36.4 C) 98.2 F (36.8 C)  TempSrc: Oral Oral Oral Oral  SpO2: 97% 96% 97% 97%  Weight:      Height:        Intake/Output Summary (Last 24 hours) at 06/22/2019 1603 Last data filed at 06/21/2019 1828 Gross per 24 hour  Intake 236 ml  Output --  Net 236 ml   Filed Weights   06/21/19 1211  Weight: 71.7 kg    Examination:  General exam: NAD Respiratory system: CTA Cardiovascular system: S 1, S 2 RRR Gastrointestinal system: BS present, soft, nt Central nervous system: alert and oriented Extremities: no edema  Data Reviewed: I have personally reviewed following labs and imaging studies  CBC: Recent Labs  Lab 06/19/19 1714 06/20/19 1601 06/21/19 0302 06/22/19 1033  WBC 16.4* 13.4* 15.6* 13.6*  NEUTROABS 12.7*  --   --   --   HGB 11.8* 9.9* 10.7* 10.1*  HCT 38.7 32.5* 34.8* 32.2*  MCV 93.9 93.1 92.8 91.0  PLT 232 225 212 761   Basic Metabolic Panel: Recent Labs  Lab 06/19/19 1714 06/19/19 1722 06/21/19 0302  NA 140  --  139  K 3.6  --  3.5  CL 99  --  104  CO2 26  --  24  GLUCOSE 135*  --  100*  BUN 13  --  9  CREATININE 0.80  --  0.67  CALCIUM 8.9  --  8.1*  MG  --  2.2 1.8   GFR: Estimated Creatinine Clearance: 53.2 mL/min (by C-G formula based on SCr of 0.67 mg/dL). Liver Function Tests: Recent Labs  Lab 06/19/19 1714  AST 19  ALT 14  ALKPHOS 111  BILITOT 1.3*  PROT 7.3  ALBUMIN 3.4*   No results for input(s): LIPASE, AMYLASE in the last 168 hours. No results for input(s): AMMONIA in the last 168 hours. Coagulation Profile: No results for input(s): INR, PROTIME in the last 168 hours. Cardiac Enzymes: No results for  input(s): CKTOTAL, CKMB, CKMBINDEX, TROPONINI in the last 168 hours. BNP (last 3 results) No results for input(s): PROBNP in the last 8760 hours. HbA1C: No results for input(s): HGBA1C in the last 72 hours. CBG: Recent Labs  Lab 06/20/19 0503  GLUCAP 123*   Lipid Profile: No results for input(s): CHOL, HDL, LDLCALC, TRIG, CHOLHDL, LDLDIRECT in the last 72 hours. Thyroid Function Tests: No results for input(s): TSH, T4TOTAL, FREET4, T3FREE, THYROIDAB in the last 72 hours. Anemia Panel: No results for input(s): VITAMINB12, FOLATE, FERRITIN, TIBC, IRON, RETICCTPCT in the last 72 hours. Sepsis Labs: Recent Labs  Lab 06/19/19 1721 06/19/19 1722 06/19/19 2005 06/20/19 0106  PROCALCITON  --  0.36  --   --   LATICACIDVEN 3.2*  --  3.0* 1.9    Recent Results (from the past 240 hour(s))  Urine culture     Status: Abnormal   Collection Time: 06/19/19  5:14 PM   Specimen: In/Out Cath Urine  Result Value Ref  Range Status   Specimen Description   Final    IN/OUT CATH URINE Performed at [facility] [address], [place], [place] [address]    Special Requests   Final    NONE Performed at [facility], [place] [address] [place], [place] [address]    Culture MULTIPLE ORGANISMS PRESENT, NONE PREDOMINANT (A)  Final   Report Status 06/21/2019 FINAL  Final  Culture, blood (routine x 2)     Status: None (Preliminary result)   Collection Time: 06/19/19  5:15 PM   Specimen: BLOOD  Result Value Ref Range Status   Specimen Description   Final    BLOOD RIGHT WRIST Performed at [facility] [address] [place], [place] [address]    Special Requests   Final    BOTTLES DRAWN AEROBIC AND ANAEROBIC Blood Culture adequate volume Performed at [facility] [address] [place], [place] [address]    Culture   Final    NO GROWTH 3 DAYS Performed at [facility], [place] [address] [place], [place] [address]     Report Status PENDING  Incomplete  Culture, blood (routine x 2)     Status: None (Preliminary result)   Collection Time: 06/19/19  5:26 PM   Specimen: BLOOD  Result Value Ref Range Status   Specimen Description   Final    BLOOD LEFT ANTECUBITAL Performed at [facility] [address], [place], [place] [address]    Special Requests   Final    BOTTLES DRAWN AEROBIC ONLY Blood Culture results may not be optimal due to an inadequate volume of blood received in culture bottles Performed at [facility] [address] [place], [place] [address]    Culture   Final    NO GROWTH 3 DAYS Performed at [facility], [place] [address] [place], [place] [address]    Report Status PENDING  Incomplete  SARS CORONAVIRUS 2 (TAT 6-24 HRS) Nasopharyngeal Nasopharyngeal Swab     Status: None   Collection Time: 06/20/19  1:06 AM   Specimen: Nasopharyngeal Swab  Result Value Ref Range Status   SARS Coronavirus 2 NEGATIVE NEGATIVE Final    Comment: (NOTE) SARS-CoV-2 target nucleic acids are NOT DETECTED. The SARS-CoV-2 RNA is generally detectable in upper and lower respiratory specimens during the acute phase of infection. Negative results do not preclude SARS-CoV-2 infection, do not rule out co-infections with other pathogens, and should not be used as the sole basis for treatment or other patient management decisions. Negative results must be combined with clinical observations, patient history, and epidemiological information. The expected result is Negative. Fact Sheet for Patients: SugarRoll.be Fact Sheet for Healthcare Providers: https://www.woods-mathews.com/ This test is not yet approved or cleared by the [place] FDA and  has been authorized for detection and/or diagnosis of SARS-CoV-2 by FDA under an Emergency Use Authorization (EUA). This EUA will remain  in effect (meaning this test can be used)  for the duration of the COVID-19 declaration under Section 56 4(b)(1) of the Act, 21 U.S.C. section 360bbb-3(b)(1), unless the authorization is terminated or revoked sooner. Performed at [facility], [place] [address], [place], [place] [address]   MRSA PCR Screening     Status: None   Collection Time: 06/20/19  4:00 PM   Specimen: Nasal Mucosa; Nasopharyngeal  Result Value Ref Range Status   MRSA by PCR NEGATIVE NEGATIVE Final    Comment:  The GeneXpert MRSA Assay (FDA approved for NASAL specimens only), is one component of a comprehensive MRSA colonization surveillance program. It is not intended to diagnose MRSA infection nor to guide or monitor treatment for MRSA infections. Performed at [facility], [place] [address] [place], [place] [address]          Radiology Studies: DG Lumbar Spine 2-3 Views  Result Date: 06/20/2019 CLINICAL DATA:  Chronic lower back pain. EXAM: LUMBAR SPINE - 2-3 VIEW COMPARISON:  Aug 17, 2018 FINDINGS: There is no evidence of an acute lumbar spine fracture. Chronic fracture deformities, with subsequent vertebroplasty, are seen at the levels of T11 and L1. Mild levoscoliosis of the lumbar spine is seen. Bilateral radiopaque pedicle screws are seen at the levels of L2, L3 and L4 with radiopaque operative material seen within the L2-L3 and L3-L4 intervertebral disc spaces. Marked severity endplate sclerosis is seen at the levels of L4-L5 and L5-S1. Moderate severity intervertebral disc space narrowing is also seen at these levels. A total right hip replacement is seen. Radiopaque surgical clips are seen overlying the right upper quadrant. There is marked severity calcification of the abdominal aorta. IMPRESSION: 1. Chronic fracture deformities with subsequent vertebroplasty at T11 and L1. 2. Bilateral radiopaque pedicle screws at L2, L3 and L4 with radiopaque operative material seen within the L2-L3 and L3-L4 intervertebral disc  spaces. 3. Marked severity endplate sclerosis at D4-Y8 and L5-S1. Electronically Signed   By: Virgina Norfolk M.D.   On: 06/20/2019 19:21   MR THORACIC SPINE WO CONTRAST  Result Date: 06/21/2019 CLINICAL DATA:  Initial evaluation for acute on chronic mid and lower back pain. Currently admitted with sepsis, presumed related to HAP, although evaluate for possible intraspinal infection. EXAM: MRI THORACIC AND LUMBAR SPINE WITHOUT CONTRAST TECHNIQUE: Multiplanar and multiecho pulse sequences of the thoracic and lumbar spine were obtained without intravenous contrast. COMPARISON:  Comparison made with recent MRIs from 04/16/2019. FINDINGS: MRI THORACIC SPINE FINDINGS Alignment: Stable alignment with trace anterolisthesis of T2 on T3 and T3 on T4. 3 mm retrolisthesis of T11 on T12 also unchanged. No interval subluxation or malalignment. Vertebrae: Chronic T11 and L1 compression fractures with sequelae of prior vertebral augmentation again noted. Vertebral body height otherwise maintained without evidence for acute or interval fracture. Bone marrow signal intensity diffusely heterogeneous but within normal limits. Multiple scattered benign hemangiomata noted. No worrisome osseous lesions. No abnormal marrow edema to suggest acute osteomyelitis or discitis. Cord: No definite cord signal abnormality seen on today's exam. Previously question signal changes at T10-11 and T11-12 related to compressive stenosis not definitely seen. Signal intensity is normal elsewhere within the thoracic spinal cord. Paraspinal and other soft tissues: Paraspinous soft tissues within normal limits. Moderate layering left pleural effusion noted. Partially visualized right lung is largely clear. Disc levels: Multilevel degenerative changes noted within the cervical spine on counter sequence without high-grade stenosis. T1-2: Mild disc bulge. Left greater than right facet hypertrophy. No stenosis. T2-3: Mild disc bulge with superimposed small  right paracentral disc protrusion (series 21, image 9). Moderate facet hypertrophy. No significant stenosis. T3-4: Trace anterolisthesis. Diffuse disc bulge, eccentric to the left. Posterior element hypertrophy. No significant spinal stenosis. Foramina remain patent. T4-5: Disc desiccation without disc bulge. Mild facet hypertrophy. No significant stenosis. T5-6: Mild disc bulge. Posterior element hypertrophy. No stenosis. T6-7: No significant disc bulge. Mild facet hypertrophy with prominence of the dorsal epidural fat. No stenosis. T7-8: Minimal disc bulge. Mild facet hypertrophy with prominence of the dorsal  epidural fat. No stenosis. T8-9: No significant disc bulge. Mild facet hypertrophy with prominence of the dorsal epidural fat. No stenosis. T9-10: Right paracentral to foraminal disc protrusion (series 21, image 30). Mild facet hypertrophy. No significant stenosis. T10-11: Diffuse disc bulge with intervertebral disc space narrowing. Endplate Schmorl's node deformity with marginal endplate osteophytic spurring. Disc bulging eccentric to the left. Moderate facet hypertrophy. Resultant moderate spinal stenosis with mild cord flattening. Again, no definite cord signal changes. Moderate left with mild right foraminal narrowing. T11-12: Retrolisthesis. Chronic intervertebral disc space narrowing with diffuse disc bulge and disc desiccation. Reactive endplate changes with marginal endplate spurring. Bilateral facet hypertrophy with prominence of the dorsal epidural fat. Resultant moderate to severe spinal stenosis with mild cord flattening. No definite cord signal changes. Severe left foraminal narrowing. Right neural foramen remains patent. T12-L1: Diffuse disc bulge with disc desiccation. Reactive endplate changes. Moderate bilateral facet hypertrophy. Mild prominence of the dorsal epidural fat. Resultant moderate spinal stenosis. Moderate left foraminal narrowing. No significant right foraminal encroachment. MRI  LUMBAR SPINE FINDINGS Segmentation: Standard. Lowest well-formed disc space labeled the L5-S1 level. Alignment: Levoscoliosis. 3 mm retrolisthesis of L1 on L2, with trace anterolisthesis of L3 on L4 and L4 on L5. 4 mm retrolisthesis of L5 on S1. Appearance is stable. Vertebrae: Susceptibility artifact from prior PLIF at L2-3 and L3-4 again seen. Chronic L1 compression deformity with sequelae of prior vertebral augmentation noted. Vertebral body height maintained without evidence for acute or interval fracture. Diffuse heterogeneity of the underlying bone marrow signal intensity without worrisome osseous lesion. No abnormal marrow edema to suggest acute osteomyelitis discitis or septic arthritis. Conus medullaris and cauda equina: Conus extends to the L1-2 level. Conus and cauda equina appear normal. Paraspinal and other soft tissues: Chronic postoperative changes present within the posterior paraspinous soft tissues. Few subcentimeter cysts noted within the left kidney. Visualized visceral structures otherwise unremarkable. Disc levels: L1-2: Retrolisthesis. Chronic intervertebral disc space narrowing with diffuse disc bulge and disc desiccation. Disc bulging eccentric to the right with prominent right-sided reactive endplate changes. Moderate facet hypertrophy. Resultant mild canal with mild to moderate right lateral recess stenosis. Mild to moderate right foraminal narrowing. Left foramen widely patent. L2-3: Prior PLIF. No residual spinal stenosis. Foramina are patent. L3-4: Prior PLIF. No residual spinal stenosis. Foramina are patent. L4-5: Trace anterolisthesis. Chronic intervertebral disc space narrowing with diffuse disc bulge and disc desiccation. Disc bulge slightly eccentric to the left. Superimposed reactive endplate changes. There is a small right subarticular disc extrusion with inferior migration (series 6, image 6), similar to previous. Superimposed moderate facet and ligament flavum hypertrophy.  Resultant moderate to severe canal with right worse than left lateral recess stenosis. Moderate left L4 foraminal narrowing. L5-S1: Retrolisthesis. Chronic intervertebral disc space narrowing with diffuse disc bulge and disc desiccation. Reactive endplate changes. Moderate to advanced facet and ligament flavum hypertrophy. Suspected changes from prior micro discectomy on the right. Superimposed epidural lipomatosis. Resultant moderate canal with right worse than left lateral recess stenosis, with mild to moderate right worse than left L5 foraminal narrowing. IMPRESSION: MR THORACIC SPINE IMPRESSION 1. Stable appearance of the thoracic spine as compared to 04/16/2019. No acute abnormality or evidence for intraspinal infection. 2. Multifactorial degenerative changes at T10-11 through T12-L1 with resultant moderate to severe diffuse spinal stenosis, stable. No appreciable cord signal changes on today's exam. 3. Moderate to severe left foraminal stenosis at T11-12 through T12-L1, also unchanged. 4. Moderate left pleural effusion. MR LUMBAR SPINE IMPRESSION 1. Stable appearance of  the lumbar spine as compared to 04/16/2019. No acute abnormality or evidence for intraspinal infection. 2. Multifactorial degenerative changes at L4-5 and L5-S1 with resultant moderate to severe canal and bilateral lateral recess stenosis as above. 3. Moderate left L4 and bilateral L5 foraminal narrowing, stable from previous. 4. Prior PLIF at L2-3 and L3-4 without residual stenosis. Electronically Signed   By: Jeannine Boga M.D.   On: 06/21/2019 22:44   MR LUMBAR SPINE WO CONTRAST  Result Date: 06/21/2019 CLINICAL DATA:  Initial evaluation for acute on chronic mid and lower back pain. Currently admitted with sepsis, presumed related to HAP, although evaluate for possible intraspinal infection. EXAM: MRI THORACIC AND LUMBAR SPINE WITHOUT CONTRAST TECHNIQUE: Multiplanar and multiecho pulse sequences of the thoracic and lumbar spine  were obtained without intravenous contrast. COMPARISON:  Comparison made with recent MRIs from 04/16/2019. FINDINGS: MRI THORACIC SPINE FINDINGS Alignment: Stable alignment with trace anterolisthesis of T2 on T3 and T3 on T4. 3 mm retrolisthesis of T11 on T12 also unchanged. No interval subluxation or malalignment. Vertebrae: Chronic T11 and L1 compression fractures with sequelae of prior vertebral augmentation again noted. Vertebral body height otherwise maintained without evidence for acute or interval fracture. Bone marrow signal intensity diffusely heterogeneous but within normal limits. Multiple scattered benign hemangiomata noted. No worrisome osseous lesions. No abnormal marrow edema to suggest acute osteomyelitis or discitis. Cord: No definite cord signal abnormality seen on today's exam. Previously question signal changes at T10-11 and T11-12 related to compressive stenosis not definitely seen. Signal intensity is normal elsewhere within the thoracic spinal cord. Paraspinal and other soft tissues: Paraspinous soft tissues within normal limits. Moderate layering left pleural effusion noted. Partially visualized right lung is largely clear. Disc levels: Multilevel degenerative changes noted within the cervical spine on counter sequence without high-grade stenosis. T1-2: Mild disc bulge. Left greater than right facet hypertrophy. No stenosis. T2-3: Mild disc bulge with superimposed small right paracentral disc protrusion (series 21, image 9). Moderate facet hypertrophy. No significant stenosis. T3-4: Trace anterolisthesis. Diffuse disc bulge, eccentric to the left. Posterior element hypertrophy. No significant spinal stenosis. Foramina remain patent. T4-5: Disc desiccation without disc bulge. Mild facet hypertrophy. No significant stenosis. T5-6: Mild disc bulge. Posterior element hypertrophy. No stenosis. T6-7: No significant disc bulge. Mild facet hypertrophy with prominence of the dorsal epidural fat. No  stenosis. T7-8: Minimal disc bulge. Mild facet hypertrophy with prominence of the dorsal epidural fat. No stenosis. T8-9: No significant disc bulge. Mild facet hypertrophy with prominence of the dorsal epidural fat. No stenosis. T9-10: Right paracentral to foraminal disc protrusion (series 21, image 30). Mild facet hypertrophy. No significant stenosis. T10-11: Diffuse disc bulge with intervertebral disc space narrowing. Endplate Schmorl's node deformity with marginal endplate osteophytic spurring. Disc bulging eccentric to the left. Moderate facet hypertrophy. Resultant moderate spinal stenosis with mild cord flattening. Again, no definite cord signal changes. Moderate left with mild right foraminal narrowing. T11-12: Retrolisthesis. Chronic intervertebral disc space narrowing with diffuse disc bulge and disc desiccation. Reactive endplate changes with marginal endplate spurring. Bilateral facet hypertrophy with prominence of the dorsal epidural fat. Resultant moderate to severe spinal stenosis with mild cord flattening. No definite cord signal changes. Severe left foraminal narrowing. Right neural foramen remains patent. T12-L1: Diffuse disc bulge with disc desiccation. Reactive endplate changes. Moderate bilateral facet hypertrophy. Mild prominence of the dorsal epidural fat. Resultant moderate spinal stenosis. Moderate left foraminal narrowing. No significant right foraminal encroachment. MRI LUMBAR SPINE FINDINGS Segmentation: Standard. Lowest well-formed disc space labeled  the L5-S1 level. Alignment: Levoscoliosis. 3 mm retrolisthesis of L1 on L2, with trace anterolisthesis of L3 on L4 and L4 on L5. 4 mm retrolisthesis of L5 on S1. Appearance is stable. Vertebrae: Susceptibility artifact from prior PLIF at L2-3 and L3-4 again seen. Chronic L1 compression deformity with sequelae of prior vertebral augmentation noted. Vertebral body height maintained without evidence for acute or interval fracture. Diffuse  heterogeneity of the underlying bone marrow signal intensity without worrisome osseous lesion. No abnormal marrow edema to suggest acute osteomyelitis discitis or septic arthritis. Conus medullaris and cauda equina: Conus extends to the L1-2 level. Conus and cauda equina appear normal. Paraspinal and other soft tissues: Chronic postoperative changes present within the posterior paraspinous soft tissues. Few subcentimeter cysts noted within the left kidney. Visualized visceral structures otherwise unremarkable. Disc levels: L1-2: Retrolisthesis. Chronic intervertebral disc space narrowing with diffuse disc bulge and disc desiccation. Disc bulging eccentric to the right with prominent right-sided reactive endplate changes. Moderate facet hypertrophy. Resultant mild canal with mild to moderate right lateral recess stenosis. Mild to moderate right foraminal narrowing. Left foramen widely patent. L2-3: Prior PLIF. No residual spinal stenosis. Foramina are patent. L3-4: Prior PLIF. No residual spinal stenosis. Foramina are patent. L4-5: Trace anterolisthesis. Chronic intervertebral disc space narrowing with diffuse disc bulge and disc desiccation. Disc bulge slightly eccentric to the left. Superimposed reactive endplate changes. There is a small right subarticular disc extrusion with inferior migration (series 6, image 6), similar to previous. Superimposed moderate facet and ligament flavum hypertrophy. Resultant moderate to severe canal with right worse than left lateral recess stenosis. Moderate left L4 foraminal narrowing. L5-S1: Retrolisthesis. Chronic intervertebral disc space narrowing with diffuse disc bulge and disc desiccation. Reactive endplate changes. Moderate to advanced facet and ligament flavum hypertrophy. Suspected changes from prior micro discectomy on the right. Superimposed epidural lipomatosis. Resultant moderate canal with right worse than left lateral recess stenosis, with mild to moderate right  worse than left L5 foraminal narrowing. IMPRESSION: MR THORACIC SPINE IMPRESSION 1. Stable appearance of the thoracic spine as compared to 04/16/2019. No acute abnormality or evidence for intraspinal infection. 2. Multifactorial degenerative changes at T10-11 through T12-L1 with resultant moderate to severe diffuse spinal stenosis, stable. No appreciable cord signal changes on today's exam. 3. Moderate to severe left foraminal stenosis at T11-12 through T12-L1, also unchanged. 4. Moderate left pleural effusion. MR LUMBAR SPINE IMPRESSION 1. Stable appearance of the lumbar spine as compared to 04/16/2019. No acute abnormality or evidence for intraspinal infection. 2. Multifactorial degenerative changes at L4-5 and L5-S1 with resultant moderate to severe canal and bilateral lateral recess stenosis as above. 3. Moderate left L4 and bilateral L5 foraminal narrowing, stable from previous. 4. Prior PLIF at L2-3 and L3-4 without residual stenosis. Electronically Signed   By: Jeannine Boga M.D.   On: 06/21/2019 22:44   MR PELVIS WO CONTRAST  Result Date: 06/21/2019 CLINICAL DATA:  Acute I have pain, chronic buttocks pain limping EXAM: MRI PELVIS WITHOUT CONTRAST TECHNIQUE: Multiplanar multisequence MR imaging of the pelvis was performed. No intravenous contrast was administered. COMPARISON:  April 17, 2019 FINDINGS: Urinary Tract: The visualized distal ureters and bladder appear unremarkable. Bowel: No bowel wall thickening, distention or surrounding inflammation identified within the pelvis. Scattered colonic diverticula are noted. Vascular/Lymphatic: No enlarged pelvic lymph nodes identified. No significant vascular findings. Reproductive: The patient is status post hysterectomy. No adnexal abnormality is noted. Other: No focal soft tissue mass or soft tissue swelling is seen. 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 No significant surrounding marrow edema  seen. No new osseous fracture is identified. The patient is status post right total hip arthroplasty with surrounding metallic artifact. There is a small left hip joint effusion seen. There is a partial tear seen of the left gluteal medius and minimus tendons at the insertion site. There is feathery signal and fluid seen surrounding the gluteus minimus and medius myotendinous junction. There is also mildly increased feathery signal seen in the bilateral adductor musculature, left greater than right. There is mild fatty atrophy of the muscles surrounding the pelvis. The remainder of the visualized portions of the tendons appear to be intact. There is moderate left hip osteoarthritis with superior joint space loss and diffuse chondral thinning. IMPRESSION: 1. Nonunited left ischial tuberosity avulsion fracture as seen on prior exam. No new acute osseous injury seen. 2. partial insertional tear of the left gluteus minimus and medius tendons with myotendinous muscular high-grade strain. 3. Bilateral, left greater than right adductor muscular edema/strain. 4. Diffuse muscular atrophy surrounding the pelvis. Electronically Signed   By: Prudencio Pair M.D.   On: 06/21/2019 22:08        Scheduled Meds:  acidophilus  1 capsule Oral TID   apixaban  5 mg Oral BID   vitamin C  1,000 mg Oral Daily   Chlorhexidine Gluconate Cloth  6 each Topical Daily   cholecalciferol  1,000 Units Oral Daily   diltiazem  90 mg Oral Q8H   doxycycline  100 mg Oral Q12H   escitalopram  10 mg Oral Daily   famotidine  20 mg Oral Daily   furosemide  40 mg Oral Daily   mouth rinse  15 mL Mouth Rinse BID   metoprolol tartrate  100 mg Oral BID   [START ON 06/23/2019] predniSONE  20 mg Oral Q breakfast   vancomycin  125 mg Oral BID   vitamin E  400 Units Oral Daily   Continuous Infusions:  cefTRIAXone (ROCEPHIN)  IV 2 g (06/21/19 2338)     LOS: 3 days    Time spent: 35 minutes.     Elmarie Shiley,  MD [facility]   If 7PM-7AM, please contact night-coverage [email or web address]  06/22/2019, 4:03 PM

## 2019-06-22 NOTE — Progress Notes (Signed)
Palliative Care Consult Note  Reason for consult:  Pain management and goals of care  I met today with Betty Jackson and her daughter.  She is not confused today and we discussed her pain regimen as well as options for care moving forward.  We discussed clinical course as well as wishes moving forward in regard to care plan moving forward. We discussed difference between a aggressive medical intervention path and a palliative, comfort focused care path.  Values and goals of care important to patient and family were attempted to be elicited.  Betty Jackson is clear that her goal is to transition to rehab facility to see how much functional status she can regain.  Questions and concerns addressed. PMT will continue to support holistically.  - Plan for trial of fentanyl patch 12.5 mcg/hr.  Continue hydrocodone for breakthrough pain.  D/c tramadol. - Betty Jackson currently follows with Dr. Harkin's office for pain management.  She has been seen most recently by Julie Breen, NP.  Attempted to call to discuss, but she was out of the office due to family emergency. -[name] is invested in plan for trial of rehab on discharge.  She is hopeful to improve her functional status and be able to return home. - Recommend that she be followed by outpatient palliative care with [facility] after she returns home.  TOTAL TIME: 40 min  Greater than 50% of this time was spent counseling and coordinating care related to the above assessment and plan.   , MD [place] [facility] [phone] 

## 2019-06-22 NOTE — Care Management Important Message (Signed)
Important Message  Patient Details IM Letter given to Roque Lias SW Case Manager to present to the Patient Name: Betty Jackson MRN: [id number] Date of Birth: 1935-02-12   Medicare Important Message Given:  Yes     Kerin Salen 06/22/2019, 10:13 AM

## 2019-06-23 ENCOUNTER — Other Ambulatory Visit: Payer: Self-pay | Admitting: *Deleted

## 2019-06-23 ENCOUNTER — Encounter: Payer: Self-pay | Admitting: *Deleted

## 2019-06-23 DIAGNOSIS — R52 Pain, unspecified: Secondary | ICD-10-CM

## 2019-06-23 DIAGNOSIS — G9340 Encephalopathy, unspecified: Secondary | ICD-10-CM

## 2019-06-23 LAB — CBC
HCT: 27.7 % — ABNORMAL LOW (ref 36.0–46.0)
Hemoglobin: 8.9 g/dL — ABNORMAL LOW (ref 12.0–15.0)
MCH: 28.8 pg (ref 26.0–34.0)
MCHC: 32.1 g/dL (ref 30.0–36.0)
MCV: 89.6 fL (ref 80.0–100.0)
Platelets: 230 10*3/uL (ref 150–400)
RBC: 3.09 MIL/uL — ABNORMAL LOW (ref 3.87–5.11)
RDW: 14.6 % (ref 11.5–15.5)
WBC: 12.8 10*3/uL — ABNORMAL HIGH (ref 4.0–10.5)
nRBC: 0 % (ref 0.0–0.2)

## 2019-06-23 LAB — SEDIMENTATION RATE: Sed Rate: 92 mm/hr — ABNORMAL HIGH (ref 0–22)

## 2019-06-23 LAB — BASIC METABOLIC PANEL
Anion gap: 11 (ref 5–15)
BUN: 30 mg/dL — ABNORMAL HIGH (ref 8–23)
CO2: 24 mmol/L (ref 22–32)
Calcium: 8.6 mg/dL — ABNORMAL LOW (ref 8.9–10.3)
Chloride: 102 mmol/L (ref 98–111)
Creatinine, Ser: 0.9 mg/dL (ref 0.44–1.00)
GFR calc Af Amer: >60 mL/min (ref >60)
GFR calc non Af Amer: 60 mL/min — ABNORMAL LOW (ref >60)
Glucose, Bld: 106 mg/dL — ABNORMAL HIGH (ref 70–99)
Potassium: 3.3 mmol/L — ABNORMAL LOW (ref 3.5–5.1)
Sodium: 137 mmol/L (ref 135–145)

## 2019-06-23 LAB — AMMONIA: Ammonia: <9 umol/L — ABNORMAL LOW (ref 9–35)

## 2019-06-23 MED ORDER — SODIUM CHLORIDE 0.9 % IV SOLN
2.0000 g | INTRAVENOUS | Status: AC
  Administered 2019-06-23: 2 g via INTRAVENOUS
  Filled 2019-06-23: qty 20

## 2019-06-23 MED ORDER — SODIUM CHLORIDE 0.9% FLUSH
10.0000 mL | Freq: Two times a day (BID) | INTRAVENOUS | Status: DC
  Administered 2019-06-23 – 2019-06-27 (×2): 10 mL

## 2019-06-23 MED ORDER — POTASSIUM CHLORIDE CRYS ER 20 MEQ PO TBCR
40.0000 meq | EXTENDED_RELEASE_TABLET | Freq: Once | ORAL | Status: AC
  Administered 2019-06-23: 40 meq via ORAL
  Filled 2019-06-23: qty 2

## 2019-06-23 MED ORDER — PREDNISONE 10 MG PO TABS
10.0000 mg | ORAL_TABLET | Freq: Every day | ORAL | Status: DC
  Administered 2019-06-23 – 2019-06-27 (×5): 10 mg via ORAL
  Filled 2019-06-23 (×5): qty 1

## 2019-06-23 MED ORDER — SODIUM CHLORIDE 0.9% FLUSH
10.0000 mL | INTRAVENOUS | Status: DC | PRN
  Administered 2019-06-24: 10 mL

## 2019-06-23 MED ORDER — OLANZAPINE 2.5 MG PO TABS
2.5000 mg | ORAL_TABLET | Freq: Every day | ORAL | Status: DC
  Administered 2019-06-23 – 2019-06-27 (×5): 2.5 mg via ORAL
  Filled 2019-06-23 (×5): qty 1

## 2019-06-23 MED ORDER — DOXYCYCLINE HYCLATE 100 MG PO TABS
100.0000 mg | ORAL_TABLET | Freq: Two times a day (BID) | ORAL | Status: AC
  Administered 2019-06-23: 100 mg via ORAL
  Filled 2019-06-23: qty 1

## 2019-06-23 NOTE — Patient Outreach (Signed)
[facility]) Care Management  06/23/2019  [name] 01/17/1935 [id number]    CSW will perform a case closure on patient, due to inability to establish initial phone contact with patient or patient's daughter, [name], despite required number of phone attempts made and outreach letter mailed to patient's home, allowing 10 business days for a response if they were interested in receiving social work services and resources through [facility] with Scientist, clinical (histocompatibility and immunogenetics).  CSW will notify patient's [name], and individual placing initial referral, also with [facility], Raina Mina of CSW's plans to close patient's case.  CSW will fax an update to patient's Primary Care Physician, Dr. Carolann Littler to ensure that he is aware of CSW's attempts at involvement with patient's plan of care.   Nat Christen, BSW, MSW, LCSW  Licensed Education officer, [facility]  Mailing [place] [address], [place], [place] [address] Physical Address-[address], [place],  [address] Toll Free Main # [phone] Fax # [phone] Cell # [phone]  Office # [phone] [email or web address]

## 2019-06-23 NOTE — Progress Notes (Signed)
Palliative Care Consult Note  Reason for consult:  Pain management and goals of care  I met today with Betty Jackson.  She easily aroused, but is confused today.  Events of overnight noted including increase in agitation.    I called and spoke with her daughter, Betty Jackson.  We reviewed increased confusion/agitation today and multifactorial nature as well as waxing and waning course with delirium.  Betty Jackson reports that her mother has had this during past admissions.  Discussed potential for CT and she did not want to pursue CT at this point.  Betty Jackson reports that she was having some confusion yesterday, even before addition of fentanyl patch, so doubt this is etiology, but as patient more sleepy today, she agrees with plan to take off current patch to see if improvement in mental status.  Questions and concerns addressed. PMT will continue to support holistically.  -Pain: Currently denies pain, but Betty Jackson is more sleepy today.  I believe it is likely this is due to delirium from hospitalization and being up all night rather than due to addition of fentanyl, but as this was just added last evening and she is sleepier today I d/c'd fentanyl patch.  Continue hydrocodone for breakthrough pain.  Consider restart fentanyl based upon her clinical course with confusion. -Delirium: multifactorial.  D/c fentnayl, plan for addition of zyprexa 2.39m nightly. - Betty Jackson currently follows with Dr. HDonell Sievertoffice for pain management.  She has been seen most recently by JLeonie Green NP.  Attempted to call to discuss, but she was out of the office due to family emergency. -Betty Jackson invested in plan for trial of rehab on discharge.  She is hopeful to improve her functional status and be able to return home. - Recommend that she be [facility]  I have called and discussed her case with [facility] liaison.  TOTAL TIME:  30 min  Greater than 50% of this time was spent counseling and coordinating care related to the above assessment and plan.  GMicheline Rough MD [facility] [phone]

## 2019-06-23 NOTE — Progress Notes (Addendum)
PROGRESS NOTE    Betty Jackson  AJO:[id number] DOB: [birth year] DOA: 06/19/2019 PCP: Eulas Post, MD   Brief Narrative: 85 year old with past medical history significant for CAD status post PCI, chronic heart failure preserved ejection fraction, chronic A. fib, scoliosis, chronic back pain status post L2-3, L3-L4 posterior fusion in 2015 and L5-S1 micro discectomy in 2017, CKD stage III, CVA, hypertension, giant cell arteritis, RA on chronic steroid with recent hospitalization from 2/28 1-06/01/18/2021 for sepsis secondary to C. difficile colitis who presented to the ED for further evaluation of low back pain for which she has been taking hydrocodone increased dose recently.  Evaluation in the ED patient was found to be febrile with temperature 102, A. fib RVR heart rate up to the 160.  Oxygen saturation in the low mid 90s.  She presented with leukocytosis white count of 16, lactic acidosis, chest x-ray showed left lung base opacity suspicious for pneumonia.  She received Tylenol, IV Cardizem and and ceftriaxone and azithromycin.  Patient was admitted for sepsis, pneumonia worsening chronic back pain   Assessment & Plan:   Principal Problem:   HCAP (healthcare-associated pneumonia) Active Problems:   Sepsis ([place])   Chronic back pain   Atrial fibrillation with rapid ventricular response (HCC)   Encephalopathy  1-Sepsis suspected secondary to HAP; -X-ray with left lung base positive which may represent pneumonia. -Covid negative. -Patient started on  ceftriaxone and doxycycline. Day 5/5. WBC trending down. Denies significant cough.  -Continue with oral vancomycin due to recent C. difficile infection -WBC stable.  -Day 5 antibiotics.  -Remain afebrile, blood culture negatives, WBC trending down.   2-A. fib with RVR: Oral Cardizem was increased.  Heart rate better controlled. Continue with metoprolol. Continue with Eliquis.  3-Acute toxic metabolic encephalopathy Suspect related  to underlying infection and opioid use. Delirium precaution. Patient with worsening confusion this morning. She was awake all night, she is very confuse. Follows command.  Discussed with Dr Domingo Cocking, he remove fentanyl patch because patient was sleepy during his  evaluation, patient with probably hospital delirium, acute illness. Plan to try low dose Zyprexa tonight. I will get ammonia level.   4-Acute on chronic back pain: Norco was resumed on 3/23.  Received a dose IV Solu-Medrol. Got more confuse after.  Palliative care consulted for goals of care and pain management. PT OT eval, needs SNF.  ESR and CRP/ elevated. MRI was negative for infection.  Prior Ischium fracture, repeat MRI. \MRI lumbar and thoracic spine stable finding negative for infection.  Pelvis MRI with Partial tear left gluteus Minimus and medius tendons with myotendinous muscular high grade strain.  Discussed with Dr Terrence Dupont Pelvis MRI result; recommend pain management, weight baring as tolerated, cushion donut. Could consider steroid injection to left ischial tuberosity if patient having pain on that site. Patient denies significant pain on the left -Will repeat ESR.  5-Chronic heart failure preserved ejection fraction: Hold lasix today due to increase BUN, and patient probably with poor oral intake today due AMS.   6-Rheumatoid arthritis on chronic steroids Continue with prednisone  7-Hypertension: Continue with Cardizem and metoprolol.  Continue to hold losartan    Estimated body mass index is 26.71 kg/m as calculated from the following:   Height as of this encounter: 5' 4.5" (1.638 m).   Weight as of this encounter: 71.7 kg.   DVT prophylaxis: Eliquis Code Status: DNR Family Communication: Daughter updated  Disposition Plan:  Patient is from: Home Anticipated d/c date: To be determined awaiting  palliative care meeting Barriers to d/c or necessity for inpatient status: Patient requiring IV antibiotics and IV  pain medication  Consultants:   Palliative care  Procedures:   None  Antimicrobials:    Subjective: Patient was alert, he was able to answer question, his speech was clear.  She is very confused.  She thought I was her mean niece.  She thought that she was in [place].  She was able to tell me her daughter name, [name].  She mentioned that she has another daughter and a son.  She thought her son was here.   Objective: Vitals:   06/22/19 0545 06/22/19 1401 06/22/19 2025 06/23/19 0556  BP: 127/61 (!) 117/58 125/60 133/89  Pulse: 65 65 70 66  Resp: '16 16 20 18  ' Temp: (!) 97.5 F (36.4 C) 98.2 F (36.8 C) 98.1 F (36.7 C) 98.8 F (37.1 C)  TempSrc: Oral Oral  Oral  SpO2: 97% 97% 96% 92%  Weight:      Height:       No intake or output data in the 24 hours ending 06/23/19 1308 Filed Weights   06/21/19 1211  Weight: 71.7 kg    Examination:  General exam:  Respiratory system: No wheezing, no ronchus.  Cardiovascular system: S 1, S 2  Gastrointestinal system: BS, present, soft, nt Central nervous system: Alert and oriented.  Extremities: no edema  Data Reviewed: I have personally reviewed following labs and imaging studies  CBC: Recent Labs  Lab 06/19/19 1714 06/20/19 1601 06/21/19 0302 06/22/19 1033 06/23/19 0601  WBC 16.4* 13.4* 15.6* 13.6* 12.8*  NEUTROABS 12.7*  --   --   --   --   HGB 11.8* 9.9* 10.7* 10.1* 8.9*  HCT 38.7 32.5* 34.8* 32.2* 27.7*  MCV 93.9 93.1 92.8 91.0 89.6  PLT 232 225 212 242 244   Basic Metabolic Panel: Recent Labs  Lab 06/19/19 1714 06/19/19 1722 06/21/19 0302 06/23/19 0601  NA 140  --  139 137  K 3.6  --  3.5 3.3*  CL 99  --  104 102  CO2 26  --  24 24  GLUCOSE 135*  --  100* 106*  BUN 13  --  9 30*  CREATININE 0.80  --  0.67 0.90  CALCIUM 8.9  --  8.1* 8.6*  MG  --  2.2 1.8  --    GFR: Estimated Creatinine Clearance: 47.3 mL/min (by C-G formula based on SCr of 0.9 mg/dL). Liver Function Tests: Recent Labs  Lab  06/19/19 1714  AST 19  ALT 14  ALKPHOS 111  BILITOT 1.3*  PROT 7.3  ALBUMIN 3.4*   No results for input(s): LIPASE, AMYLASE in the last 168 hours. No results for input(s): AMMONIA in the last 168 hours. Coagulation Profile: No results for input(s): INR, PROTIME in the last 168 hours. Cardiac Enzymes: No results for input(s): CKTOTAL, CKMB, CKMBINDEX, TROPONINI in the last 168 hours. BNP (last 3 results) No results for input(s): PROBNP in the last 8760 hours. HbA1C: No results for input(s): HGBA1C in the last 72 hours. CBG: Recent Labs  Lab 06/20/19 0503  GLUCAP 123*   Lipid Profile: No results for input(s): CHOL, HDL, LDLCALC, TRIG, CHOLHDL, LDLDIRECT in the last 72 hours. Thyroid Function Tests: No results for input(s): TSH, T4TOTAL, FREET4, T3FREE, THYROIDAB in the last 72 hours. Anemia Panel: No results for input(s): VITAMINB12, FOLATE, FERRITIN, TIBC, IRON, RETICCTPCT in the last 72 hours. Sepsis Labs: Recent Labs  Lab 06/19/19 1721  06/19/19 1722 06/19/19 2005 06/20/19 0106  PROCALCITON  --  0.36  --   --   LATICACIDVEN 3.2*  --  3.0* 1.9    Recent Results (from the past 240 hour(s))  Urine culture     Status: Abnormal   Collection Time: 06/19/19  5:14 PM   Specimen: In/Out Cath Urine  Result Value Ref Range Status   Specimen Description   Final    IN/OUT CATH URINE Performed at [facility] [address], [place], [place] [address]    Special Requests   Final    NONE Performed at [facility], [place] [address], [place], [place] [address]    Culture MULTIPLE ORGANISMS PRESENT, NONE PREDOMINANT (A)  Final   Report Status 06/21/2019 FINAL  Final  Culture, blood (routine x 2)     Status: None (Preliminary result)   Collection Time: 06/19/19  5:15 PM   Specimen: BLOOD  Result Value Ref Range Status   Specimen Description   Final    BLOOD RIGHT WRIST Performed at [facility] [address],  [place], [place] [address]    Special Requests   Final    BOTTLES DRAWN AEROBIC AND ANAEROBIC Blood Culture adequate volume Performed at [facility] [address], [place], [place] [address]    Culture   Final    NO GROWTH 4 DAYS Performed at [facility], [place] [address] [place], [place] [address]    Report Status PENDING  Incomplete  Culture, blood (routine x 2)     Status: None (Preliminary result)   Collection Time: 06/19/19  5:26 PM   Specimen: BLOOD  Result Value Ref Range Status   Specimen Description   Final    BLOOD LEFT ANTECUBITAL Performed at [facility] [address] [place], [place] [address]    Special Requests   Final    BOTTLES DRAWN AEROBIC ONLY Blood Culture results may not be optimal due to an inadequate volume of blood received in culture bottles Performed at [facility] [address], [place], [place] [address]    Culture   Final    NO GROWTH 4 DAYS Performed at [facility], [place] [address] [place], [place] [address]    Report Status PENDING  Incomplete  SARS CORONAVIRUS 2 (TAT 6-24 HRS) Nasopharyngeal Nasopharyngeal Swab     Status: None   Collection Time: 06/20/19  1:06 AM   Specimen: Nasopharyngeal Swab  Result Value Ref Range Status   SARS Coronavirus 2 NEGATIVE NEGATIVE Final    Comment: (NOTE) SARS-CoV-2 target nucleic acids are NOT DETECTED. The SARS-CoV-2 RNA is generally detectable in upper and lower respiratory specimens during the acute phase of infection. Negative results do not preclude SARS-CoV-2 infection, do not rule out co-infections with other pathogens, and should not be used as the sole basis for treatment or other patient management decisions. Negative results must be combined with clinical observations, patient history, and epidemiological information. The expected result is Negative. Fact Sheet for Patients: SugarRoll[employer]be Fact  Sheet for Healthcare Providers: https://www.woods-mathews.com/ This test is not yet approved or cleared by the [place] FDA and  has been authorized for detection and/or diagnosis of SARS-CoV-2 by FDA under an Emergency Use Authorization (EUA). This EUA will remain  in effect (meaning this test can be used) for the duration of the COVID-19 declaration under Section 56 4(b)(1) of the Act, 21 U.S.C. section 360bbb-3(b)(1), unless  the authorization is terminated or revoked sooner. Performed at [facility], [place] [address] [place], [place] [address]   MRSA PCR Screening     Status: None   Collection Time: 06/20/19  4:00 PM   Specimen: Nasal Mucosa; Nasopharyngeal  Result Value Ref Range Status   MRSA by PCR NEGATIVE NEGATIVE Final    Comment:        The GeneXpert MRSA Assay (FDA approved for NASAL specimens only), is one component of a comprehensive MRSA colonization surveillance program. It is not intended to diagnose MRSA infection nor to guide or monitor treatment for MRSA infections. Performed at [facility], [place] [address] [place], [place] [address]          Radiology Studies: MR THORACIC SPINE WO CONTRAST  Result Date: 06/21/2019 CLINICAL DATA:  Initial evaluation for acute on chronic mid and lower back pain. Currently admitted with sepsis, presumed related to HAP, although evaluate for possible intraspinal infection. EXAM: MRI THORACIC AND LUMBAR SPINE WITHOUT CONTRAST TECHNIQUE: Multiplanar and multiecho pulse sequences of the thoracic and lumbar spine were obtained without intravenous contrast. COMPARISON:  Comparison made with recent MRIs from 04/16/2019. FINDINGS: MRI THORACIC SPINE FINDINGS Alignment: Stable alignment with trace anterolisthesis of T2 on T3 and T3 on T4. 3 mm retrolisthesis of T11 on T12 also unchanged. No interval subluxation or malalignment. Vertebrae: Chronic T11 and L1 compression fractures with sequelae of  prior vertebral augmentation again noted. Vertebral body height otherwise maintained without evidence for acute or interval fracture. Bone marrow signal intensity diffusely heterogeneous but within normal limits. Multiple scattered benign hemangiomata noted. No worrisome osseous lesions. No abnormal marrow edema to suggest acute osteomyelitis or discitis. Cord: No definite cord signal abnormality seen on today's exam. Previously question signal changes at T10-11 and T11-12 related to compressive stenosis not definitely seen. Signal intensity is normal elsewhere within the thoracic spinal cord. Paraspinal and other soft tissues: Paraspinous soft tissues within normal limits. Moderate layering left pleural effusion noted. Partially visualized right lung is largely clear. Disc levels: Multilevel degenerative changes noted within the cervical spine on counter sequence without high-grade stenosis. T1-2: Mild disc bulge. Left greater than right facet hypertrophy. No stenosis. T2-3: Mild disc bulge with superimposed small right paracentral disc protrusion (series 21, image 9). Moderate facet hypertrophy. No significant stenosis. T3-4: Trace anterolisthesis. Diffuse disc bulge, eccentric to the left. Posterior element hypertrophy. No significant spinal stenosis. Foramina remain patent. T4-5: Disc desiccation without disc bulge. Mild facet hypertrophy. No significant stenosis. T5-6: Mild disc bulge. Posterior element hypertrophy. No stenosis. T6-7: No significant disc bulge. Mild facet hypertrophy with prominence of the dorsal epidural fat. No stenosis. T7-8: Minimal disc bulge. Mild facet hypertrophy with prominence of the dorsal epidural fat. No stenosis. T8-9: No significant disc bulge. Mild facet hypertrophy with prominence of the dorsal epidural fat. No stenosis. T9-10: Right paracentral to foraminal disc protrusion (series 21, image 30). Mild facet hypertrophy. No significant stenosis. T10-11: Diffuse disc bulge with  intervertebral disc space narrowing. Endplate Schmorl's node deformity with marginal endplate osteophytic spurring. Disc bulging eccentric to the left. Moderate facet hypertrophy. Resultant moderate spinal stenosis with mild cord flattening. Again, no definite cord signal changes. Moderate left with mild right foraminal narrowing. T11-12: Retrolisthesis. Chronic intervertebral disc space narrowing with diffuse disc bulge and disc desiccation. Reactive endplate changes with marginal endplate spurring. Bilateral facet hypertrophy with prominence of the dorsal epidural fat. Resultant moderate to severe spinal stenosis with mild cord flattening. No definite  cord signal changes. Severe left foraminal narrowing. Right neural foramen remains patent. T12-L1: Diffuse disc bulge with disc desiccation. Reactive endplate changes. Moderate bilateral facet hypertrophy. Mild prominence of the dorsal epidural fat. Resultant moderate spinal stenosis. Moderate left foraminal narrowing. No significant right foraminal encroachment. MRI LUMBAR SPINE FINDINGS Segmentation: Standard. Lowest well-formed disc space labeled the L5-S1 level. Alignment: Levoscoliosis. 3 mm retrolisthesis of L1 on L2, with trace anterolisthesis of L3 on L4 and L4 on L5. 4 mm retrolisthesis of L5 on S1. Appearance is stable. Vertebrae: Susceptibility artifact from prior PLIF at L2-3 and L3-4 again seen. Chronic L1 compression deformity with sequelae of prior vertebral augmentation noted. Vertebral body height maintained without evidence for acute or interval fracture. Diffuse heterogeneity of the underlying bone marrow signal intensity without worrisome osseous lesion. No abnormal marrow edema to suggest acute osteomyelitis discitis or septic arthritis. Conus medullaris and cauda equina: Conus extends to the L1-2 level. Conus and cauda equina appear normal. Paraspinal and other soft tissues: Chronic postoperative changes present within the posterior paraspinous  soft tissues. Few subcentimeter cysts noted within the left kidney. Visualized visceral structures otherwise unremarkable. Disc levels: L1-2: Retrolisthesis. Chronic intervertebral disc space narrowing with diffuse disc bulge and disc desiccation. Disc bulging eccentric to the right with prominent right-sided reactive endplate changes. Moderate facet hypertrophy. Resultant mild canal with mild to moderate right lateral recess stenosis. Mild to moderate right foraminal narrowing. Left foramen widely patent. L2-3: Prior PLIF. No residual spinal stenosis. Foramina are patent. L3-4: Prior PLIF. No residual spinal stenosis. Foramina are patent. L4-5: Trace anterolisthesis. Chronic intervertebral disc space narrowing with diffuse disc bulge and disc desiccation. Disc bulge slightly eccentric to the left. Superimposed reactive endplate changes. There is a small right subarticular disc extrusion with inferior migration (series 6, image 6), similar to previous. Superimposed moderate facet and ligament flavum hypertrophy. Resultant moderate to severe canal with right worse than left lateral recess stenosis. Moderate left L4 foraminal narrowing. L5-S1: Retrolisthesis. Chronic intervertebral disc space narrowing with diffuse disc bulge and disc desiccation. Reactive endplate changes. Moderate to advanced facet and ligament flavum hypertrophy. Suspected changes from prior micro discectomy on the right. Superimposed epidural lipomatosis. Resultant moderate canal with right worse than left lateral recess stenosis, with mild to moderate right worse than left L5 foraminal narrowing. IMPRESSION: MR THORACIC SPINE IMPRESSION 1. Stable appearance of the thoracic spine as compared to 04/16/2019. No acute abnormality or evidence for intraspinal infection. 2. Multifactorial degenerative changes at T10-11 through T12-L1 with resultant moderate to severe diffuse spinal stenosis, stable. No appreciable cord signal changes on today's exam. 3.  Moderate to severe left foraminal stenosis at T11-12 through T12-L1, also unchanged. 4. Moderate left pleural effusion. MR LUMBAR SPINE IMPRESSION 1. Stable appearance of the lumbar spine as compared to 04/16/2019. No acute abnormality or evidence for intraspinal infection. 2. Multifactorial degenerative changes at L4-5 and L5-S1 with resultant moderate to severe canal and bilateral lateral recess stenosis as above. 3. Moderate left L4 and bilateral L5 foraminal narrowing, stable from previous. 4. Prior PLIF at L2-3 and L3-4 without residual stenosis. Electronically Signed   By: Jeannine Boga M.D.   On: 06/21/2019 22:44   MR LUMBAR SPINE WO CONTRAST  Result Date: 06/21/2019 CLINICAL DATA:  Initial evaluation for acute on chronic mid and lower back pain. Currently admitted with sepsis, presumed related to HAP, although evaluate for possible intraspinal infection. EXAM: MRI THORACIC AND LUMBAR SPINE WITHOUT CONTRAST TECHNIQUE: Multiplanar and multiecho pulse sequences of the thoracic and  lumbar spine were obtained without intravenous contrast. COMPARISON:  Comparison made with recent MRIs from 04/16/2019. FINDINGS: MRI THORACIC SPINE FINDINGS Alignment: Stable alignment with trace anterolisthesis of T2 on T3 and T3 on T4. 3 mm retrolisthesis of T11 on T12 also unchanged. No interval subluxation or malalignment. Vertebrae: Chronic T11 and L1 compression fractures with sequelae of prior vertebral augmentation again noted. Vertebral body height otherwise maintained without evidence for acute or interval fracture. Bone marrow signal intensity diffusely heterogeneous but within normal limits. Multiple scattered benign hemangiomata noted. No worrisome osseous lesions. No abnormal marrow edema to suggest acute osteomyelitis or discitis. Cord: No definite cord signal abnormality seen on today's exam. Previously question signal changes at T10-11 and T11-12 related to compressive stenosis not definitely seen. Signal  intensity is normal elsewhere within the thoracic spinal cord. Paraspinal and other soft tissues: Paraspinous soft tissues within normal limits. Moderate layering left pleural effusion noted. Partially visualized right lung is largely clear. Disc levels: Multilevel degenerative changes noted within the cervical spine on counter sequence without high-grade stenosis. T1-2: Mild disc bulge. Left greater than right facet hypertrophy. No stenosis. T2-3: Mild disc bulge with superimposed small right paracentral disc protrusion (series 21, image 9). Moderate facet hypertrophy. No significant stenosis. T3-4: Trace anterolisthesis. Diffuse disc bulge, eccentric to the left. Posterior element hypertrophy. No significant spinal stenosis. Foramina remain patent. T4-5: Disc desiccation without disc bulge. Mild facet hypertrophy. No significant stenosis. T5-6: Mild disc bulge. Posterior element hypertrophy. No stenosis. T6-7: No significant disc bulge. Mild facet hypertrophy with prominence of the dorsal epidural fat. No stenosis. T7-8: Minimal disc bulge. Mild facet hypertrophy with prominence of the dorsal epidural fat. No stenosis. T8-9: No significant disc bulge. Mild facet hypertrophy with prominence of the dorsal epidural fat. No stenosis. T9-10: Right paracentral to foraminal disc protrusion (series 21, image 30). Mild facet hypertrophy. No significant stenosis. T10-11: Diffuse disc bulge with intervertebral disc space narrowing. Endplate Schmorl's node deformity with marginal endplate osteophytic spurring. Disc bulging eccentric to the left. Moderate facet hypertrophy. Resultant moderate spinal stenosis with mild cord flattening. Again, no definite cord signal changes. Moderate left with mild right foraminal narrowing. T11-12: Retrolisthesis. Chronic intervertebral disc space narrowing with diffuse disc bulge and disc desiccation. Reactive endplate changes with marginal endplate spurring. Bilateral facet hypertrophy with  prominence of the dorsal epidural fat. Resultant moderate to severe spinal stenosis with mild cord flattening. No definite cord signal changes. Severe left foraminal narrowing. Right neural foramen remains patent. T12-L1: Diffuse disc bulge with disc desiccation. Reactive endplate changes. Moderate bilateral facet hypertrophy. Mild prominence of the dorsal epidural fat. Resultant moderate spinal stenosis. Moderate left foraminal narrowing. No significant right foraminal encroachment. MRI LUMBAR SPINE FINDINGS Segmentation: Standard. Lowest well-formed disc space labeled the L5-S1 level. Alignment: Levoscoliosis. 3 mm retrolisthesis of L1 on L2, with trace anterolisthesis of L3 on L4 and L4 on L5. 4 mm retrolisthesis of L5 on S1. Appearance is stable. Vertebrae: Susceptibility artifact from prior PLIF at L2-3 and L3-4 again seen. Chronic L1 compression deformity with sequelae of prior vertebral augmentation noted. Vertebral body height maintained without evidence for acute or interval fracture. Diffuse heterogeneity of the underlying bone marrow signal intensity without worrisome osseous lesion. No abnormal marrow edema to suggest acute osteomyelitis discitis or septic arthritis. Conus medullaris and cauda equina: Conus extends to the L1-2 level. Conus and cauda equina appear normal. Paraspinal and other soft tissues: Chronic postoperative changes present within the posterior paraspinous soft tissues. Few subcentimeter cysts noted within  the left kidney. Visualized visceral structures otherwise unremarkable. Disc levels: L1-2: Retrolisthesis. Chronic intervertebral disc space narrowing with diffuse disc bulge and disc desiccation. Disc bulging eccentric to the right with prominent right-sided reactive endplate changes. Moderate facet hypertrophy. Resultant mild canal with mild to moderate right lateral recess stenosis. Mild to moderate right foraminal narrowing. Left foramen widely patent. L2-3: Prior PLIF. No  residual spinal stenosis. Foramina are patent. L3-4: Prior PLIF. No residual spinal stenosis. Foramina are patent. L4-5: Trace anterolisthesis. Chronic intervertebral disc space narrowing with diffuse disc bulge and disc desiccation. Disc bulge slightly eccentric to the left. Superimposed reactive endplate changes. There is a small right subarticular disc extrusion with inferior migration (series 6, image 6), similar to previous. Superimposed moderate facet and ligament flavum hypertrophy. Resultant moderate to severe canal with right worse than left lateral recess stenosis. Moderate left L4 foraminal narrowing. L5-S1: Retrolisthesis. Chronic intervertebral disc space narrowing with diffuse disc bulge and disc desiccation. Reactive endplate changes. Moderate to advanced facet and ligament flavum hypertrophy. Suspected changes from prior micro discectomy on the right. Superimposed epidural lipomatosis. Resultant moderate canal with right worse than left lateral recess stenosis, with mild to moderate right worse than left L5 foraminal narrowing. IMPRESSION: MR THORACIC SPINE IMPRESSION 1. Stable appearance of the thoracic spine as compared to 04/16/2019. No acute abnormality or evidence for intraspinal infection. 2. Multifactorial degenerative changes at T10-11 through T12-L1 with resultant moderate to severe diffuse spinal stenosis, stable. No appreciable cord signal changes on today's exam. 3. Moderate to severe left foraminal stenosis at T11-12 through T12-L1, also unchanged. 4. Moderate left pleural effusion. MR LUMBAR SPINE IMPRESSION 1. Stable appearance of the lumbar spine as compared to 04/16/2019. No acute abnormality or evidence for intraspinal infection. 2. Multifactorial degenerative changes at L4-5 and L5-S1 with resultant moderate to severe canal and bilateral lateral recess stenosis as above. 3. Moderate left L4 and bilateral L5 foraminal narrowing, stable from previous. 4. Prior PLIF at L2-3 and L3-4  without residual stenosis. Electronically Signed   By: Jeannine Boga M.D.   On: 06/21/2019 22:44   MR PELVIS WO CONTRAST  Result Date: 06/21/2019 CLINICAL DATA:  Acute I have pain, chronic buttocks pain limping EXAM: MRI PELVIS WITHOUT CONTRAST TECHNIQUE: Multiplanar multisequence MR imaging of the pelvis was performed. No intravenous contrast was administered. COMPARISON:  April 17, 2019 FINDINGS: Urinary Tract: The visualized distal ureters and bladder appear unremarkable. Bowel: No bowel wall thickening, distention or surrounding inflammation identified within the pelvis. Scattered colonic diverticula are noted. Vascular/Lymphatic: No enlarged pelvic lymph nodes identified. No significant vascular findings. Reproductive: The patient is status post hysterectomy. No adnexal abnormality is noted. Other: No focal soft tissue mass or soft tissue swelling is seen. Musculoskeletal: Again noted is a nonunited avulsion injury off the left ischial tuberosity. No significant surrounding marrow edema seen. No new osseous fracture is identified. The patient is status post right total hip arthroplasty with surrounding metallic artifact. There is a small left hip joint effusion seen. There is a partial tear seen of the left gluteal medius and minimus tendons at the insertion site. There is feathery signal and fluid seen surrounding the gluteus minimus and medius myotendinous junction. There is also mildly increased feathery signal seen in the bilateral adductor musculature, left greater than right. There is mild fatty atrophy of the muscles surrounding the pelvis. The remainder of the visualized portions of the tendons appear to be intact. There is moderate left hip osteoarthritis with superior joint space loss and  diffuse chondral thinning. IMPRESSION: 1. Nonunited left ischial tuberosity avulsion fracture as seen on prior exam. No new acute osseous injury seen. 2. partial insertional tear of the left gluteus  minimus and medius tendons with myotendinous muscular high-grade strain. 3. Bilateral, left greater than right adductor muscular edema/strain. 4. Diffuse muscular atrophy surrounding the pelvis. Electronically Signed   By: Prudencio Pair M.D.   On: 06/21/2019 22:08        Scheduled Meds: . acidophilus  1 capsule Oral TID  . apixaban  5 mg Oral BID  . vitamin C  1,000 mg Oral Daily  . Chlorhexidine Gluconate Cloth  6 each Topical Daily  . cholecalciferol  1,000 Units Oral Daily  . diltiazem  90 mg Oral Q8H  . doxycycline  100 mg Oral Q12H  . escitalopram  10 mg Oral Daily  . famotidine  20 mg Oral Daily  . fentaNYL  1 patch Transdermal Q72H  . mouth rinse  15 mL Mouth Rinse BID  . metoprolol tartrate  100 mg Oral BID  . potassium chloride  40 mEq Oral Once  . [START ON 06/24/2019] predniSONE  10 mg Oral Q breakfast  . vancomycin  125 mg Oral BID  . vitamin E  400 Units Oral Daily   Continuous Infusions: . cefTRIAXone (ROCEPHIN)  IV       LOS: 4 days    Time spent: 35 minutes.     Elmarie Shiley, MD [facility]   If 7PM-7AM, please contact night-coverage [email or web address]  06/23/2019, 1:08 PM

## 2019-06-23 NOTE — Progress Notes (Addendum)
Patient became increasingly agitated and attempted to get out of bed a few times around 0445. Unable to re-orient or re-direct as was possible throughout most of the shift. Will not likely benefit from sitter or tele-sitter at this time as she is not re-directable. Daughter notified per prior request, and to avoid other measures; she arrived to patient's room around 0530. Betty Jackson (daughter) requests for the patient to not be disturbed/woken, and would like an 0800 med. delayed. Patient continued to refuse telemetry and pulse ox monitoring devices, but is no longer attempting to leave room. On-call notified of patient's behavior and daughter's requests; day shift RN should notify day shift doctor about medication delay requests from [facility]. Daughter would like any staff, physicians, etc to speak with her prior to waking patient. Sign placed on door to not wake patient per family request. Betty Jackson left patient's room around 0630. Patient appears to be sleeping, bed alarm on, will increase rounds and monitor closely.

## 2019-06-23 NOTE — TOC Progression Note (Signed)
Transition of Care [facility]) - Progression Note    Patient Details  Name: Betty Jackson MRN: [id number] Date of Birth: 01-15-1935  [facility]) CM/SW Contact  Lennart Pall, LCSW Phone Number: 06/23/2019, 1:37 PM  Clinical Narrative:   Have spoken with daughter, Betty Jackson, this afternoon who confirms the pt and family wish to pursue SNF placement.  She is also working with an outside Tourist information centre manager who she has asked that I speak with about facilities.  Will begin SNF bed search.    Expected Discharge Plan: Skilled Nursing Facility Barriers to Discharge: Continued Medical Work up  Expected Discharge Plan and Services Expected Discharge Plan: [place] In-house Referral: Clinical Social Work, Hospice / [place] arrangements for the past 2 months: Single Family Home                                       Social Determinants of Health (SDOH) Interventions    Readmission Risk Interventions Readmission Risk Prevention Plan 06/21/2019 06/01/2019 04/21/2019  Transportation Screening Complete Complete Complete  Medication Review Press photographer) Complete Complete Complete  PCP or Specialist appointment within 3-5 days of discharge Not Complete Complete Complete  PCP/Specialist Appt Not Complete comments likely SNF placement - -  [place] or [place] Not Complete Complete Complete  HRI or Home Care Consult Pt Refusal Comments likely SNF - -  SW Recovery Care/Counseling Consult Complete Complete Complete  Palliative Care Screening Complete - Not Applicable  Skilled Nursing Facility Complete Not Applicable Not Applicable  Some recent data might be hidden

## 2019-06-23 NOTE — Progress Notes (Signed)
Pt's daughter is requesting that the staff do not disturb the pt and allow her to sleep. The daughter also is requesting that the pt's 0800 Prednisone tablet be discontinued because she wants to continue to give this to the pt when she gets back home. The pt's daughter is requesting that all of the scheduled 1000 am medications be held until the pt wakes up. I informed the daughter due to safety we are not allowed to close the pt's door and have to leave it partially open. RN will continue to monitor the pt and update the daughter if there are any changes.

## 2019-06-24 LAB — CULTURE, BLOOD (ROUTINE X 2)
Culture: NO GROWTH
Culture: NO GROWTH
Special Requests: ADEQUATE

## 2019-06-24 LAB — BASIC METABOLIC PANEL
Anion gap: 9 (ref 5–15)
BUN: 31 mg/dL — ABNORMAL HIGH (ref 8–23)
CO2: 27 mmol/L (ref 22–32)
Calcium: 8.6 mg/dL — ABNORMAL LOW (ref 8.9–10.3)
Chloride: 106 mmol/L (ref 98–111)
Creatinine, Ser: 0.86 mg/dL (ref 0.44–1.00)
GFR calc Af Amer: >60 mL/min (ref >60)
GFR calc non Af Amer: >60 mL/min (ref >60)
Glucose, Bld: 98 mg/dL (ref 70–99)
Potassium: 4.2 mmol/L (ref 3.5–5.1)
Sodium: 142 mmol/L (ref 135–145)

## 2019-06-24 LAB — CBC
HCT: 29.3 % — ABNORMAL LOW (ref 36.0–46.0)
Hemoglobin: 9.1 g/dL — ABNORMAL LOW (ref 12.0–15.0)
MCH: 28.7 pg (ref 26.0–34.0)
MCHC: 31.1 g/dL (ref 30.0–36.0)
MCV: 92.4 fL (ref 80.0–100.0)
Platelets: 300 10*3/uL (ref 150–400)
RBC: 3.17 MIL/uL — ABNORMAL LOW (ref 3.87–5.11)
RDW: 14.8 % (ref 11.5–15.5)
WBC: 8.6 10*3/uL (ref 4.0–10.5)
nRBC: 0 % (ref 0.0–0.2)

## 2019-06-24 LAB — GLUCOSE, CAPILLARY: Glucose-Capillary: 75 mg/dL (ref 70–99)

## 2019-06-24 MED ORDER — POTASSIUM CHLORIDE CRYS ER 20 MEQ PO TBCR
20.0000 meq | EXTENDED_RELEASE_TABLET | Freq: Every day | ORAL | Status: DC
  Administered 2019-06-25: 20 meq via ORAL
  Filled 2019-06-24: qty 1

## 2019-06-24 MED ORDER — FUROSEMIDE 40 MG PO TABS: 40.0000 mg | ORAL_TABLET | Freq: Every day | ORAL | Status: DC

## 2019-06-24 MED ORDER — FUROSEMIDE 40 MG PO TABS
40.0000 mg | ORAL_TABLET | Freq: Every day | ORAL | Status: DC
  Administered 2019-06-25: 40 mg via ORAL
  Filled 2019-06-24: qty 1

## 2019-06-24 NOTE — Progress Notes (Signed)
PROGRESS NOTE    Betty Jackson  ZOX:[id number] DOB: [birth year] DOA: 06/19/2019 PCP: Eulas Post, MD   Brief Narrative: 85 year old with past medical history significant for CAD status post PCI, chronic heart failure preserved ejection fraction, chronic A. fib, scoliosis, chronic back pain status post L2-3, L3-L4 posterior fusion in 2015 and L5-S1 micro discectomy in 2017, CKD stage III, CVA, hypertension, giant cell arteritis, RA on chronic steroid with recent hospitalization from 2/28 1-06/01/18/2021 for sepsis secondary to C. difficile colitis who presented to the ED for further evaluation of low back pain for which she has been taking hydrocodone increased dose recently.  Evaluation in the ED patient was found to be febrile with temperature 102, A. fib RVR heart rate up to the 160.  Oxygen saturation in the low mid 90s.  She presented with leukocytosis white count of 16, lactic acidosis, chest x-ray showed left lung base opacity suspicious for pneumonia.  She received Tylenol, IV Cardizem and and ceftriaxone and azithromycin.  Patient was admitted for sepsis, pneumonia worsening chronic back pain   Assessment & Plan:   Principal Problem:   HCAP (healthcare-associated pneumonia) Active Problems:   Sepsis ([place])   Chronic back pain   Atrial fibrillation with rapid ventricular response (HCC)   Encephalopathy  1-Sepsis suspected secondary to HAP; -X-ray with left lung base positive which may represent pneumonia. -Covid negative. -Continue with oral vancomycin due to recent C. difficile infection -WBC normalized.  -Completed 5 days of antibiotics. Ceftriaxone and azithromycin.  -Blood culture no growth to date.   2-A. fib with RVR: Oral Cardizem was increased.  Heart rate better controlled. Continue with metoprolol. Continue with Eliquis.  3-Acute toxic metabolic encephalopathy Suspect related to underlying infection and opioid use. Delirium precaution. Started on low dose  zyprexa.  Per nurse report patient was able to sleep overnight. She slept most of the morning.  She appears less confuse today.   4-Acute on chronic back pain: Norco was resumed on 3/23.  Received a dose IV Solu-Medrol. Got more confuse after.  Palliative care consulted for goals of care and pain management. PT OT eval, needs SNF.  ESR and CRP/ elevated. MRI was negative for infection.  Prior Ischium fracture, repeat MRI. \MRI lumbar and thoracic spine stable finding negative for infection.  Pelvis MRI with Partial tear left gluteus Minimus and medius tendons with myotendinous muscular high grade strain.  Discussed with Dr Terrence Dupont Pelvis MRI result; recommend pain management, weight baring as tolerated, cushion donut. Could consider steroid injection to left ischial tuberosity if patient having pain on that site. Patient denies significant pain on the left -ESR decreased to 90. Discussed case with Dr Ellene Route, he reviewed MRI, he agrees there is not evidence of infection. He recommend pain management for back pain.  Appreciate Dr Domingo Cocking with pain management.   5-Chronic heart failure preserved ejection fraction: Resume lasix tomorrow  6-Rheumatoid arthritis on chronic steroids Continue with prednisone  7-Hypertension: Continue with Cardizem and metoprolol.  Continue to hold losartan    Estimated body mass index is 26.71 kg/m as calculated from the following:   Height as of this encounter: 5' 4.5" (1.638 m).   Weight as of this encounter: 71.7 kg.   DVT prophylaxis: Eliquis Code Status: DNR Family Communication: Daughter updated 3-26 Disposition Plan:  Patient is from: Home Anticipated d/c date: To be determined awaiting palliative care meeting Barriers to d/c or necessity for inpatient status: SNF when bed available.   Consultants:   Palliative care  Procedures:   None  Antimicrobials:    Subjective: She slept most of the morning. She would open eyes, answer  questions. She was to tell me she is in the hospital. Her son is in [place], she has two daughter. She has not seen Melissa this morning.  She relates her back pain is controlled , currently.   Objective: Vitals:   06/23/19 0556 06/23/19 2126 06/23/19 2126 06/24/19 0449  BP: 133/89 (!) 147/66 (!) 147/66 (!) 141/61  Pulse: 66 68 68 68  Resp: '18  18 18  ' Temp: 98.8 F (37.1 C)  98.2 F (36.8 C) 97.8 F (36.6 C)  TempSrc: Oral  Oral Axillary  SpO2: 92%  94% 94%  Weight:      Height:        Intake/Output Summary (Last 24 hours) at 06/24/2019 1308 Last data filed at 06/24/2019 1212 Gross per 24 hour  Intake 250 ml  Output 900 ml  Net -650 ml   Filed Weights   06/21/19 1211  Weight: 71.7 kg    Examination:  General exam: NAD, sleepy  Respiratory system: No wheezing.  Cardiovascular system: S 1, S 2 RRR Gastrointestinal system: BS present, soft, nt Central nervous system: alert, follows command.  Extremities: no edema  Data Reviewed: I have personally reviewed following labs and imaging studies  CBC: Recent Labs  Lab 06/19/19 1714 06/19/19 1714 06/20/19 1601 06/21/19 0302 06/22/19 1033 06/23/19 0601 06/24/19 0456  WBC 16.4*   < > 13.4* 15.6* 13.6* 12.8* 8.6  NEUTROABS 12.7*  --   --   --   --   --   --   HGB 11.8*   < > 9.9* 10.7* 10.1* 8.9* 9.1*  HCT 38.7   < > 32.5* 34.8* 32.2* 27.7* 29.3*  MCV 93.9   < > 93.1 92.8 91.0 89.6 92.4  PLT 232   < > 225 212 242 230 300   < > = values in this interval not displayed.   Basic Metabolic Panel: Recent Labs  Lab 06/19/19 1714 06/19/19 1722 06/21/19 0302 06/23/19 0601 06/24/19 0456  NA 140  --  139 137 142  K 3.6  --  3.5 3.3* 4.2  CL 99  --  104 102 106  CO2 26  --  '24 24 27  ' GLUCOSE 135*  --  100* 106* 98  BUN 13  --  9 30* 31*  CREATININE 0.80  --  0.67 0.90 0.86  CALCIUM 8.9  --  8.1* 8.6* 8.6*  MG  --  2.2 1.8  --   --    GFR: Estimated Creatinine Clearance: 49.5 mL/min (by C-G formula based on SCr of  0.86 mg/dL). Liver Function Tests: Recent Labs  Lab 06/19/19 1714  AST 19  ALT 14  ALKPHOS 111  BILITOT 1.3*  PROT 7.3  ALBUMIN 3.4*   No results for input(s): LIPASE, AMYLASE in the last 168 hours. Recent Labs  Lab 06/23/19 1334  AMMONIA <9*   Coagulation Profile: No results for input(s): INR, PROTIME in the last 168 hours. Cardiac Enzymes: No results for input(s): CKTOTAL, CKMB, CKMBINDEX, TROPONINI in the last 168 hours. BNP (last 3 results) No results for input(s): PROBNP in the last 8760 hours. HbA1C: No results for input(s): HGBA1C in the last 72 hours. CBG: Recent Labs  Lab 06/20/19 0503 06/24/19 1126  GLUCAP 123* 75   Lipid Profile: No results for input(s): CHOL, HDL, LDLCALC, TRIG, CHOLHDL, LDLDIRECT in the last 72 hours. Thyroid Function  Tests: No results for input(s): TSH, T4TOTAL, FREET4, T3FREE, THYROIDAB in the last 72 hours. Anemia Panel: No results for input(s): VITAMINB12, FOLATE, FERRITIN, TIBC, IRON, RETICCTPCT in the last 72 hours. Sepsis Labs: Recent Labs  Lab 06/19/19 1721 06/19/19 1722 06/19/19 2005 06/20/19 0106  PROCALCITON  --  0.36  --   --   LATICACIDVEN 3.2*  --  3.0* 1.9    Recent Results (from the past 240 hour(s))  Urine culture     Status: Abnormal   Collection Time: 06/19/19  5:14 PM   Specimen: In/Out Cath Urine  Result Value Ref Range Status   Specimen Description   Final    IN/OUT CATH URINE Performed at [facility], [place] [address] [place], [place] [address]    Special Requests   Final    NONE Performed at [facility], [place] [address], [place], [place] [address]    Culture MULTIPLE ORGANISMS PRESENT, NONE PREDOMINANT (A)  Final   Report Status 06/21/2019 FINAL  Final  Culture, blood (routine x 2)     Status: None (Preliminary result)   Collection Time: 06/19/19  5:15 PM   Specimen: BLOOD  Result Value Ref Range Status   Specimen Description   Final    BLOOD RIGHT WRIST  Performed at [facility] [address] [place], [place] [address]    Special Requests   Final    BOTTLES DRAWN AEROBIC AND ANAEROBIC Blood Culture adequate volume Performed at [facility] [address], [place], [place] [address]    Culture   Final    NO GROWTH 4 DAYS Performed at [facility], [place] [address], [place], [place] [address]    Report Status PENDING  Incomplete  Culture, blood (routine x 2)     Status: None (Preliminary result)   Collection Time: 06/19/19  5:26 PM   Specimen: BLOOD  Result Value Ref Range Status   Specimen Description   Final    BLOOD LEFT ANTECUBITAL Performed at [facility] [address] [place], [place] [address]    Special Requests   Final    BOTTLES DRAWN AEROBIC ONLY Blood Culture results may not be optimal due to an inadequate volume of blood received in culture bottles Performed at [facility] [address], [place], [place] [address]    Culture   Final    NO GROWTH 4 DAYS Performed at [facility], [place] [address] [place], [place] [address]    Report Status PENDING  Incomplete  SARS CORONAVIRUS 2 (TAT 6-24 HRS) Nasopharyngeal Nasopharyngeal Swab     Status: None   Collection Time: 06/20/19  1:06 AM   Specimen: Nasopharyngeal Swab  Result Value Ref Range Status   SARS Coronavirus 2 NEGATIVE NEGATIVE Final    Comment: (NOTE) SARS-CoV-2 target nucleic acids are NOT DETECTED. The SARS-CoV-2 RNA is generally detectable in upper and lower respiratory specimens during the acute phase of infection. Negative results do not preclude SARS-CoV-2 infection, do not rule out co-infections with other pathogens, and should not be used as the sole basis for treatment or other patient management decisions. Negative results must be combined with clinical observations, patient history, and epidemiological information. The expected result is Negative. Fact  Sheet for Patients: SugarRoll.be Fact Sheet for Healthcare Providers: https://www.woods-mathews.com/ This test is not yet approved or cleared by the [place] FDA and  has been authorized for detection and/or diagnosis of SARS-CoV-2  by FDA under an Emergency Use Authorization (EUA). This EUA will remain  in effect (meaning this test can be used) for the duration of the COVID-19 declaration under Section 56 4(b)(1) of the Act, 21 U.S.C. section 360bbb-3(b)(1), unless the authorization is terminated or revoked sooner. Performed at [facility], [place] [address], [place], [place] [address]   MRSA PCR Screening     Status: None   Collection Time: 06/20/19  4:00 PM   Specimen: Nasal Mucosa; Nasopharyngeal  Result Value Ref Range Status   MRSA by PCR NEGATIVE NEGATIVE Final    Comment:        The GeneXpert MRSA Assay (FDA approved for NASAL specimens only), is one component of a comprehensive MRSA colonization surveillance program. It is not intended to diagnose MRSA infection nor to guide or monitor treatment for MRSA infections. Performed at [facility], [place] [address] [place], [place] [address]          Radiology Studies: No results found.      Scheduled Meds: . acidophilus  1 capsule Oral TID  . apixaban  5 mg Oral BID  . vitamin C  1,000 mg Oral Daily  . Chlorhexidine Gluconate Cloth  6 each Topical Daily  . cholecalciferol  1,000 Units Oral Daily  . diltiazem  90 mg Oral Q8H  . escitalopram  10 mg Oral Daily  . famotidine  20 mg Oral Daily  . fentaNYL  1 patch Transdermal Q72H  . mouth rinse  15 mL Mouth Rinse BID  . metoprolol tartrate  100 mg Oral BID  . OLANZapine  2.5 mg Oral QHS  . predniSONE  10 mg Oral Q breakfast  . sodium chloride flush  10-40 mL Intracatheter Q12H  . vancomycin  125 mg Oral BID  . vitamin E  400 Units Oral Daily   Continuous Infusions:    LOS: 5 days     Time spent: 35 minutes.     Elmarie Shiley, MD [facility]   If 7PM-7AM, please contact night-coverage [email or web address]  06/24/2019, 1:08 PM

## 2019-06-24 NOTE — Progress Notes (Signed)
PT Cancellation Note  Patient Details Name: PIERRETTE ALSOP MRN: [id number] DOB: [birth year]   Cancelled Treatment:    Reason Eval/Treat Not Completed: Patient declined, no reason specified; patient stated she needed the bedpan and would not be able to do PT right now.   Floria Raveling. Hartnett-Rands, MS, PT Per [place] [phone] 06/24/2019, 2:28 PM

## 2019-06-24 NOTE — Progress Notes (Signed)
Palliative Care Consult Note  Reason for consult:  Pain management and goals of care  I met today with Betty Jackson.  She is lying in bed.  She is not confused today.    She reports pain is well controlled today.  Denies concerns or complaints.  Questions and concerns addressed. PMT will continue to support holistically.  -Pain: Currently denies pain. Continue hydrocodone for breakthrough pain.   -Delirium: Improved. Continue zyprexa 2.23m nightly. - [name] currently follows with Dr. HDonell Sievertoffice for pain management.  She has been seen most recently by JLeonie Green NP.  Attempted to call to discuss, but she was out of the office due to family emergency. -[name] invested in plan for trial of rehab on discharge.  She is hopeful to improve her functional status and be able to return home. - Recommend that she be followed by outpatient palliative care with [facility].  I have called and discussed her case with [facility] liaison.  TOTAL TIME: 20 min  Greater than 50% of this time was spent counseling and coordinating care related to the above assessment and plan.  GMicheline Rough MD [facility] [phone]

## 2019-06-25 LAB — CBC
HCT: 29.3 % — ABNORMAL LOW (ref 36.0–46.0)
Hemoglobin: 9.3 g/dL — ABNORMAL LOW (ref 12.0–15.0)
MCH: 29.2 pg (ref 26.0–34.0)
MCHC: 31.7 g/dL (ref 30.0–36.0)
MCV: 91.8 fL (ref 80.0–100.0)
Platelets: 294 10*3/uL (ref 150–400)
RBC: 3.19 MIL/uL — ABNORMAL LOW (ref 3.87–5.11)
RDW: 14.9 % (ref 11.5–15.5)
WBC: 9.8 10*3/uL (ref 4.0–10.5)
nRBC: 0 % (ref 0.0–0.2)

## 2019-06-25 LAB — BASIC METABOLIC PANEL
Anion gap: 11 (ref 5–15)
BUN: 18 mg/dL (ref 8–23)
CO2: 24 mmol/L (ref 22–32)
Calcium: 8.5 mg/dL — ABNORMAL LOW (ref 8.9–10.3)
Chloride: 105 mmol/L (ref 98–111)
Creatinine, Ser: 0.73 mg/dL (ref 0.44–1.00)
GFR calc Af Amer: >60 mL/min (ref >60)
GFR calc non Af Amer: >60 mL/min (ref >60)
Glucose, Bld: 119 mg/dL — ABNORMAL HIGH (ref 70–99)
Potassium: 3.2 mmol/L — ABNORMAL LOW (ref 3.5–5.1)
Sodium: 140 mmol/L (ref 135–145)

## 2019-06-25 LAB — GLUCOSE, CAPILLARY: Glucose-Capillary: 112 mg/dL — ABNORMAL HIGH (ref 70–99)

## 2019-06-25 MED ORDER — VANCOMYCIN 50 MG/ML ORAL SOLUTION: 125.0000 mg | ORAL | Status: DC

## 2019-06-25 MED ORDER — VANCOMYCIN 50 MG/ML ORAL SOLUTION
125.0000 mg | Freq: Four times a day (QID) | ORAL | Status: DC
  Filled 2019-06-25: qty 2.5

## 2019-06-25 MED ORDER — POTASSIUM CHLORIDE CRYS ER 20 MEQ PO TBCR
40.0000 meq | EXTENDED_RELEASE_TABLET | Freq: Two times a day (BID) | ORAL | Status: DC
  Filled 2019-06-25: qty 2

## 2019-06-25 MED ORDER — SODIUM CHLORIDE 0.9 % IV SOLN: INTRAVENOUS | Status: DC

## 2019-06-25 MED ORDER — POTASSIUM CHLORIDE 10 MEQ/100ML IV SOLN
10.0000 meq | INTRAVENOUS | Status: AC
  Administered 2019-06-25 (×4): 10 meq via INTRAVENOUS
  Filled 2019-06-25 (×4): qty 100

## 2019-06-25 MED ORDER — VANCOMYCIN 50 MG/ML ORAL SOLUTION: 125.0000 mg | Freq: Every day | ORAL | Status: DC

## 2019-06-25 MED ORDER — VANCOMYCIN 50 MG/ML ORAL SOLUTION
125.0000 mg | Freq: Four times a day (QID) | ORAL | Status: DC
  Administered 2019-06-25 – 2019-06-27 (×12): 125 mg via ORAL
  Filled 2019-06-25 (×12): qty 2.5

## 2019-06-25 MED ORDER — VANCOMYCIN 50 MG/ML ORAL SOLUTION: 125.0000 mg | Freq: Two times a day (BID) | ORAL | Status: DC

## 2019-06-25 NOTE — Progress Notes (Signed)
Palliative Care Consult Note  Reason for consult:  Pain management and goals of care  I met today with Betty Jackson.  She is lying in bed.  She is confused again today.    Two daughters at bedside Betty Jackson and Betty Jackson).  Discussed her clinical course and family expressed concern about her course moving forward with recurrent confusion.  Discussed again hope for rehab to regain functional status but also family concern how well she will tolerate rehab.  On days when she is not confused, she reports wanting to participate, but over the past couple of days she has not been able to do so.  This may be related to worsening infection with c-diff.  Discussed that this may or may not improve with increasing vanc regimen again.  Betty Jackson reports that it may be helpful to plan for family meeting during which her brother could attend by phone as well.  Questions and concerns addressed. PMT will continue to support holistically.  -Pain: Currently denies pain. Continue hydrocodone for breakthrough pain.  Depending on goals, could reconsider fentanyl patch (would be more likely to restart if goals change to comfort care/hospice approach rather than trial of rehab). -Delirium: Worsened again today.  ? If worsened because of infection.  Continue oral vanc. Continue zyprexa nightly. - Betty Jackson currently follows with Dr. Donell Sievert office for pain management.  She has been seen most recently by Leonie Green, NP.  Attempted to call to discuss, but she was out of the office due to family emergency. -Betty Jackson has bee invested in plan for trial of rehab on discharge.  She is hopeful to improve her functional status and be able to return home.  Her daughter expressed concern about her ability to tolerate this if confusion does not improve.  Will plan for family meeting to discuss options further.  Daughter to call with time to meet on 3/29. - Recommend that she be followed by outpatient palliative care with [facility]  through [place] after she returns home.  I have called and discussed her case with [facility] liaison.  TOTAL TIME: 40 min  Greater than 50% of this time was spent counseling and coordinating care related to the above assessment and plan.  Micheline Rough, MD [facility] [phone]

## 2019-06-25 NOTE — Progress Notes (Signed)
PT Cancellation Note  Patient Details Name: Betty Jackson MRN: [id number] DOB: Mar 04, 1935   Cancelled Treatment:    Reason Eval/Treat Not Completed: Medical issues which prohibited therapy. PT spoke with nursing. Unconfirmed c diff. 3 bouts of diarrhea within 30 minutes. Daughter at bedside stating patient just got to sleep and requested PT come back tomorrow.    Floria Raveling. Hartnett-Rands, MS, PT Per [place] [phone] 06/25/2019, 1:58 PM

## 2019-06-25 NOTE — Progress Notes (Signed)
PROGRESS NOTE    Betty Jackson  YNW:[id number] DOB: [birth year] DOA: 06/19/2019 PCP: Eulas Post, MD   Brief Narrative: 85 year old with past medical history significant for CAD status post PCI, chronic heart failure preserved ejection fraction, chronic A. fib, scoliosis, chronic back pain status post L2-3, L3-L4 posterior fusion in 2015 and L5-S1 micro discectomy in 2017, CKD stage III, CVA, hypertension, giant cell arteritis, RA on chronic steroid with recent hospitalization from 2/28 1-06/01/18/2021 for sepsis secondary to C. difficile colitis who presented to the ED for further evaluation of low back pain for which she has been taking hydrocodone increased dose recently.  Evaluation in the ED patient was found to be febrile with temperature 102, A. fib RVR heart rate up to the 160.  Oxygen saturation in the low mid 90s.  She presented with leukocytosis white count of 16, lactic acidosis, chest x-ray showed left lung base opacity suspicious for pneumonia.  She received Tylenol, IV Cardizem and and ceftriaxone and azithromycin.  Patient was admitted for sepsis, pneumonia worsening chronic back pain.  Pleated treatment for pneumonia with 5 days of ceftriaxone and azithromycin.  She has remained afebrile.  Patient has developed worsening diarrhea.  Will increase vancomycin to 125 mg every 4 hours.  She will require a longer taper dose.    Assessment & Plan:   Principal Problem:   HCAP (healthcare-associated pneumonia) Active Problems:   Sepsis ([place])   Chronic back pain   Atrial fibrillation with rapid ventricular response (HCC)   Encephalopathy  1-Sepsis suspected secondary to HAP; -X-ray with left lung base positive which may represent pneumonia. -Covid negative. -Continue with oral vancomycin due to recent C. difficile infection -WBC normalized.  -Completed 5 days of antibiotics. Ceftriaxone and azithromycin.  -Blood culture no growth to date.   2-History of C. difficile  colitis: Patient with worsening diarrhea. -Discussed case with infectious disease, we should assume that this is recurrence of C. difficile.  Recommend a longer taper of vancomycin. -Increase Vancomycin to 125 mg Every 4 hours.  -Start IV fluids.  -Hold lasix.   3-Acute toxic metabolic encephalopathy, Delirium.  Suspect related to underlying infection and opioid use. Delirium precaution. Frequent orientation.  Started on low dose zyprexa. Continue Patient was not able to sleep well through the night. She also develops diarrhea yesterday.  Worsening confusion today, might be related to Diarrhea, Infection, and sleep deprivation.   4-Acute on chronic back pain: -Norco was resumed on 3/23.  -Received a dose IV Solu-Medrol. Got more confuse after.  -Palliative care consulted for goals of care and pain management. -PT OT eval, needs SNF.  -ESR and CRP/ elevated. MRI was negative for infection.  -Prior Ischium fracture, repeated MRI. MRI lumbar and thoracic spine stable finding negative for infection.  Pelvis MRI with Partial tear left gluteus Minimus and medius tendons with myotendinous muscular high grade strain.  -Discussed with Dr Terrence Dupont Pelvis MRI result; recommend pain management, weight baring as tolerated, cushion donut. Could consider steroid injection to left ischial tuberosity if patient having pain on that site. Patient denies significant pain on the left -ESR decreased to 90. Discussed case with Dr Ellene Route, he reviewed MRI, he agrees there is not evidence of infection. He recommend pain management for back pain.  -Appreciate Dr Domingo Cocking with pain management.  -didn't received fentanyl yesterday, patient reported pain was controlled yesterday.  -today she is complaining of Back pain, she is more confuse today. She is sleepy at times as well.  -Dr Domingo Cocking  will follow up on patient today.   A. fib with RVR: Oral Cardizem was increased.  Heart rate better controlled. Continue with  metoprolol. Continue with Eliquis.  -Chronic heart failure preserved ejection fraction: Hold lasix due to diarrhea.   Rheumatoid arthritis on chronic steroids Continue with prednisone, 10 mg to avoid worsening confusion.   -Hypertension: Continue with Cardizem and metoprolol.  Continue to hold losartan    Estimated body mass index is 26.71 kg/m as calculated from the following:   Height as of this encounter: 5' 4.5" (1.638 m).   Weight as of this encounter: 71.7 kg.   DVT prophylaxis: Eliquis Code Status: DNR Family Communication: Daughter updated 3-28 Disposition Plan:  Patient is from: Home Anticipated d/c date: 2-3 days depending on improvement of Diarrhea  Barriers to d/c or necessity for inpatient status: SNF when Diarrhea improved, and Back pain is controlled.   Consultants:   Palliative care  Procedures:   None  Antimicrobials:  Completed Ceftriaxone and Azithromycin.   Subjective: Patient was alert during my evaluation.  She is very confused this morning again.  She said that " she will get up, and  will get everybody a drink.  She appreciated the breakfast that her niece cook for her.  She does not remember the name of this little town"  -She denies Abdominal pain. She is having Back pain, rate pain at 8, then went back to sleep.    Objective: Vitals:   06/24/19 0449 06/24/19 1315 06/24/19 2145 06/25/19 0547  BP: (!) 141/61 (!) 132/54 (!) 144/60 (!) 159/68  Pulse: 68 (!) 58 74 66  Resp: _0 Temp: 97.8 F (36.6 C) 98 F (36.7 C) 98.8 F (37.1 C) 98.7 F (37.1 C)  TempSrc: Axillary Oral Oral Oral  SpO2: 94% 97% 96% 94%  Weight:      Height:        Intake/Output Summary (Last 24 hours) at 06/25/2019 0954 Last data filed at 06/25/2019 0319 Gross per 24 hour  Intake 240 ml  Output 350 ml  Net -110 ml   Filed Weights   06/21/19 1211  Weight: 71.7 kg    Examination:  General exam: NAD Respiratory system: CTA Cardiovascular system:   S 1 S 2 RRR Gastrointestinal system: BS present, soft, nt Central nervous system: alert, answer questions, confuse Extremities: no edema  Data Reviewed: I have personally reviewed following labs and imaging studies  CBC: Recent Labs  Lab 06/19/19 1714 06/19/19 1714 06/20/19 1601 06/21/19 0302 06/22/19 1033 06/23/19 0601 06/24/19 0456  WBC 16.4*   < > 13.4* 15.6* 13.6* 12.8* 8.6  NEUTROABS 12.7*  --   --   --   --   --   --   HGB 11.8*   < > 9.9* 10.7* 10.1* 8.9* 9.1*  HCT 38.7   < > 32.5* 34.8* 32.2* 27.7* 29.3*  MCV 93.9   < > 93.1 92.8 91.0 89.6 92.4  PLT 232   < > 225 212 242 230 300   < > = values in this interval not displayed.   Basic Metabolic Panel: Recent Labs  Lab 06/19/19 1714 06/19/19 1722 06/21/19 0302 06/23/19 0601 06/24/19 0456  NA 140  --  139 137 142  K 3.6  --  3.5 3.3* 4.2  CL 99  --  104 102 106  CO2 26  --  _1 GLUCOSE 135*  --  100* 106* 98  BUN 13  --  9 30* 31*  CREATININE 0.80  --  0.67 0.90 0.86  CALCIUM 8.9  --  8.1* 8.6* 8.6*  MG  --  2.2 1.8  --   --    GFR: Estimated Creatinine Clearance: 49.5 mL/min (by C-G formula based on SCr of 0.86 mg/dL). Liver Function Tests: Recent Labs  Lab 06/19/19 1714  AST 19  ALT 14  ALKPHOS 111  BILITOT 1.3*  PROT 7.3  ALBUMIN 3.4*   No results for input(s): LIPASE, AMYLASE in the last 168 hours. Recent Labs  Lab 06/23/19 1334  AMMONIA <9*   Coagulation Profile: No results for input(s): INR, PROTIME in the last 168 hours. Cardiac Enzymes: No results for input(s): CKTOTAL, CKMB, CKMBINDEX, TROPONINI in the last 168 hours. BNP (last 3 results) No results for input(s): PROBNP in the last 8760 hours. HbA1C: No results for input(s): HGBA1C in the last 72 hours. CBG: Recent Labs  Lab 06/20/19 0503 06/24/19 1126  GLUCAP 123* 75   Lipid Profile: No results for input(s): CHOL, HDL, LDLCALC, TRIG, CHOLHDL, LDLDIRECT in the last 72 hours. Thyroid Function Tests: No results for  input(s): TSH, T4TOTAL, FREET4, T3FREE, THYROIDAB in the last 72 hours. Anemia Panel: No results for input(s): VITAMINB12, FOLATE, FERRITIN, TIBC, IRON, RETICCTPCT in the last 72 hours. Sepsis Labs: Recent Labs  Lab 06/19/19 1721 06/19/19 1722 06/19/19 2005 06/20/19 0106  PROCALCITON  --  0.36  --   --   LATICACIDVEN 3.2*  --  3.0* 1.9    Recent Results (from the past 240 hour(s))  Urine culture     Status: Abnormal   Collection Time: 06/19/19  5:14 PM   Specimen: In/Out Cath Urine  Result Value Ref Range Status   Specimen Description   Final    IN/OUT CATH URINE Performed at [facility], [place] [address] [place], [place] [address]    Special Requests   Final    NONE Performed at [facility], [place] [address], [place], [place] [address]    Culture MULTIPLE ORGANISMS PRESENT, NONE PREDOMINANT (A)  Final   Report Status 06/21/2019 FINAL  Final  Culture, blood (routine x 2)     Status: None   Collection Time: 06/19/19  5:15 PM   Specimen: BLOOD  Result Value Ref Range Status   Specimen Description   Final    BLOOD RIGHT WRIST Performed at [facility] [address] [place], [place] [address]    Special Requests   Final    BOTTLES DRAWN AEROBIC AND ANAEROBIC Blood Culture adequate volume Performed at [facility] [address], [place], [place] [address]    Culture   Final    NO GROWTH 5 DAYS Performed at [facility], [place] [address] [place], [place] [address]    Report Status 06/24/2019 FINAL  Final  Culture, blood (routine x 2)     Status: None   Collection Time: 06/19/19  5:26 PM   Specimen: BLOOD  Result Value Ref Range Status   Specimen Description   Final    BLOOD LEFT ANTECUBITAL Performed at [facility] [address] [place], [place] [address]    Special Requests   Final    BOTTLES DRAWN AEROBIC ONLY Blood Culture results may not be optimal due to an  inadequate volume of blood received in culture bottles Performed at [facility] [address], [place], [place] [address]    Culture  Final    NO GROWTH 5 DAYS Performed at [facility], [place] [address], [place], [place] [address]    Report Status 06/24/2019 FINAL  Final  SARS CORONAVIRUS 2 (TAT 6-24 HRS) Nasopharyngeal Nasopharyngeal Swab     Status: None   Collection Time: 06/20/19  1:06 AM   Specimen: Nasopharyngeal Swab  Result Value Ref Range Status   SARS Coronavirus 2 NEGATIVE NEGATIVE Final    Comment: (NOTE) SARS-CoV-2 target nucleic acids are NOT DETECTED. The SARS-CoV-2 RNA is generally detectable in upper and lower respiratory specimens during the acute phase of infection. Negative results do not preclude SARS-CoV-2 infection, do not rule out co-infections with other pathogens, and should not be used as the sole basis for treatment or other patient management decisions. Negative results must be combined with clinical observations, patient history, and epidemiological information. The expected result is Negative. Fact Sheet for Patients: SugarRoll.be Fact Sheet for Healthcare Providers: https://www.woods-mathews.com/ This test is not yet approved or cleared by the [place] FDA and  has been authorized for detection and/or diagnosis of SARS-CoV-2 by FDA under an Emergency Use Authorization (EUA). This EUA will remain  in effect (meaning this test can be used) for the duration of the COVID-19 declaration under Section 56 4(b)(1) of the Act, 21 U.S.C. section 360bbb-3(b)(1), unless the authorization is terminated or revoked sooner. Performed at [facility], [place] [address], [place], [place] [address]   MRSA PCR Screening     Status: None   Collection Time: 06/20/19  4:00 PM   Specimen: Nasal Mucosa; Nasopharyngeal  Result Value Ref Range Status   MRSA by PCR NEGATIVE NEGATIVE Final     Comment:        The GeneXpert MRSA Assay (FDA approved for NASAL specimens only), is one component of a comprehensive MRSA colonization surveillance program. It is not intended to diagnose MRSA infection nor to guide or monitor treatment for MRSA infections. Performed at [facility], [place] [address] [place],  [address]          Radiology Studies: No results found.      Scheduled Meds: . acidophilus  1 capsule Oral TID  . apixaban  5 mg Oral BID  . vitamin C  1,000 mg Oral Daily  . Chlorhexidine Gluconate Cloth  6 each Topical Daily  . cholecalciferol  1,000 Units Oral Daily  . diltiazem  90 mg Oral Q8H  . escitalopram  10 mg Oral Daily  . famotidine  20 mg Oral Daily  . fentaNYL  1 patch Transdermal Q72H  . furosemide  40 mg Oral Daily  . mouth rinse  15 mL Mouth Rinse BID  . metoprolol tartrate  100 mg Oral BID  . OLANZapine  2.5 mg Oral QHS  . potassium chloride  20 mEq Oral Daily  . predniSONE  10 mg Oral Q breakfast  . sodium chloride flush  10-40 mL Intracatheter Q12H  . vancomycin  125 mg Oral Q6H  . vitamin E  400 Units Oral Daily   Continuous Infusions: . sodium chloride       LOS: 6 days    Time spent: 35 minutes.     Elmarie Shiley, MD [facility]   If 7PM-7AM, please contact night-coverage [email or web address]  06/25/2019, 9:54 AM

## 2019-06-26 LAB — BASIC METABOLIC PANEL
Anion gap: 9 (ref 5–15)
BUN: 14 mg/dL (ref 8–23)
CO2: 27 mmol/L (ref 22–32)
Calcium: 8.4 mg/dL — ABNORMAL LOW (ref 8.9–10.3)
Chloride: 105 mmol/L (ref 98–111)
Creatinine, Ser: 0.68 mg/dL (ref 0.44–1.00)
GFR calc Af Amer: >60 mL/min (ref >60)
GFR calc non Af Amer: >60 mL/min (ref >60)
Glucose, Bld: 92 mg/dL (ref 70–99)
Potassium: 4.1 mmol/L (ref 3.5–5.1)
Sodium: 141 mmol/L (ref 135–145)

## 2019-06-26 MED ORDER — FENTANYL 12 MCG/HR TD PT72
1.0000 | MEDICATED_PATCH | TRANSDERMAL | Status: DC
  Administered 2019-06-26: 1 via TRANSDERMAL
  Filled 2019-06-26 (×2): qty 1

## 2019-06-26 NOTE — Progress Notes (Signed)
PROGRESS NOTE    Betty Jackson  MRN:[id number] DOB: 09/04/1934 DOA: 06/19/2019 PCP: Burchette, Bruce W, MD   Brief Narrative: 84-year-old with past medical history significant for CAD status post PCI, chronic heart failure preserved ejection fraction, chronic A. fib, scoliosis, chronic back pain status post L2-3, L3-L4 posterior fusion in 2015 and L5-S1 micro discectomy in 2017, CKD stage III, CVA, hypertension, giant cell arteritis, RA on chronic steroid with recent hospitalization from 2/28 1-06/01/18/2021 for sepsis secondary to C. difficile colitis who presented to the ED for further evaluation of low back pain for which she has been taking hydrocodone increased dose recently.  Evaluation in the ED patient was found to be febrile with temperature 102, A. fib RVR heart rate up to the 160.  Oxygen saturation in the low mid 90s.  She presented with leukocytosis white count of 16, lactic acidosis, chest x-ray showed left lung base opacity suspicious for pneumonia.  She received Tylenol, IV Cardizem and and ceftriaxone and azithromycin.  Patient was admitted for sepsis, pneumonia worsening chronic back pain.  Pleated treatment for pneumonia with 5 days of ceftriaxone and azithromycin.  She has remained afebrile.  Patient has developed worsening diarrhea.  Will increase vancomycin to 125 mg every 4 hours.  She will require a longer taper dose.    Assessment & Plan:   Principal Problem:   HCAP (healthcare-associated pneumonia) Active Problems:   Sepsis (HCC)   Chronic back pain   Atrial fibrillation with rapid ventricular response (HCC)   Encephalopathy  1-Sepsis suspected secondary to HAP; -X-ray with left lung base positive which may represent pneumonia. -Covid negative. -Continue with oral vancomycin due to recent C. difficile infection -WBC normalized.  -Completed 5 days of antibiotics. Ceftriaxone and azithromycin.  -Blood culture no growth to date.   2-History of C. difficile  colitis: Patient with worsening diarrhea. -Discussed case with infectious disease, we should assume that this is recurrence of C. difficile.  Recommend a longer taper of vancomycin. -Increase Vancomycin to 125 mg Every 4 hours.  -received  IV fluids.  -Hold lasix.  -diarrhea improving.   3-Acute toxic metabolic encephalopathy, Delirium.  Suspect related to underlying infection and opioid use. Delirium precaution. Frequent orientation.  Started on low dose zyprexa. Continue Patient was not able to sleep well through the night. She also develops diarrhea yesterday.  Worsening confusion 3-28, might be related to Diarrhea, Infection, and sleep deprivation.  Improved today   4-Acute on chronic back pain: -Norco was resumed on 3/23.  -Received a dose IV Solu-Medrol. Got more confuse after.  -Palliative care consulted for goals of care and pain management. -PT OT eval, needs SNF.  -ESR and CRP/ elevated. MRI was negative for infection.  -Prior Ischium fracture, repeated MRI. MRI lumbar and thoracic spine stable finding negative for infection.  Pelvis MRI with Partial tear left gluteus Minimus and medius tendons with myotendinous muscular high grade strain.  -Discussed with Dr Handi Pelvis MRI result; recommend pain management, weight baring as tolerated, cushion donut. Could consider steroid injection to left ischial tuberosity if patient having pain on that site. Patient denies significant pain on the left -ESR decreased to 90. Discussed case with Dr Elsner, he reviewed MRI, he agrees there is not evidence of infection. He recommend pain management for back pain.  -Appreciate Dr Freeman with pain management.  -didn't received fentanyl yesterday, patient reported pain was controlled yesterday.  -today she is complaining of Back pain, she is more confuse today. She is sleepy   at times as well.  -Dr Domingo Cocking will follow up on patient today.   A. fib with RVR: Oral Cardizem was increased.  Heart  rate better controlled. Continue with metoprolol. Continue with Eliquis.  -Chronic heart failure preserved ejection fraction: Hold lasix due to diarrhea.   Rheumatoid arthritis on chronic steroids Continue with prednisone, 10 mg to avoid worsening confusion.   -Hypertension: Continue with Cardizem and metoprolol.  Continue to hold losartan    Estimated body mass index is 26.71 kg/m as calculated from the following:   Height as of this encounter: 5' 4.5" (1.638 m).   Weight as of this encounter: 71.7 kg.   DVT prophylaxis: Eliquis Code Status: DNR Family Communication: Daughter updated 3-28 Disposition Plan:  Patient is from: Home Anticipated d/c date: 2-3 days depending on improvement of Diarrhea  Barriers to d/c or necessity for inpatient status: probably SNF in 24 hours. Family meeting today   Consultants:   Palliative care  Procedures:   None  Antimicrobials:  Completed Ceftriaxone and Azithromycin.   Subjective: She is less confuse today. Had small bM today.  Feels better, pain 3/10  Objective: Vitals:   06/25/19 1034 06/25/19 1503 06/25/19 2019 06/26/19 0529  BP: (!) 162/70 138/63 (!) 158/69 (!) 157/64  Pulse: 75 78 84 68  Resp: _0 Temp: 98.5 F (36.9 C) 99.1 F (37.3 C) 98.3 F (36.8 C) 98 F (36.7 C)  TempSrc: Oral Oral Oral Oral  SpO2: 100% 97% 98% 96%  Weight:      Height:        Intake/Output Summary (Last 24 hours) at 06/26/2019 1409 Last data filed at 06/26/2019 0300 Gross per 24 hour  Intake 1003.45 ml  Output 500 ml  Net 503.45 ml   Filed Weights   06/21/19 1211  Weight: 71.7 kg    Examination:  General exam: NAD Respiratory system: CTA Cardiovascular system:  S 1, S 2 RRR Gastrointestinal system: BS present, soft, nt Central nervous system: alert, less confuse Extremities: No edema  Data Reviewed: I have personally reviewed following labs and imaging studies  CBC: Recent Labs  Lab 06/19/19 1714 06/20/19 1601  06/21/19 0302 06/22/19 1033 06/23/19 0601 06/24/19 0456 06/25/19 0947  WBC 16.4*   < > 15.6* 13.6* 12.8* 8.6 9.8  NEUTROABS 12.7*  --   --   --   --   --   --   HGB 11.8*   < > 10.7* 10.1* 8.9* 9.1* 9.3*  HCT 38.7   < > 34.8* 32.2* 27.7* 29.3* 29.3*  MCV 93.9   < > 92.8 91.0 89.6 92.4 91.8  PLT 232   < > 212 242 230 300 294   < > = values in this interval not displayed.   Basic Metabolic Panel: Recent Labs  Lab 06/19/19 1714 06/19/19 1722 06/21/19 0302 06/23/19 0601 06/24/19 0456 06/25/19 0947 06/26/19 0356  NA   < >  --  139 137 142 140 141  K   < >  --  3.5 3.3* 4.2 3.2* 4.1  CL   < >  --  104 102 106 105 105  CO2   < >  --  _1 GLUCOSE   < >  --  100* 106* 98 119* 92  BUN   < >  --  9 30* 31* 18 14  CREATININE   < >  --  0.67 0.90 0.86 0.73 0.68  CALCIUM   < >  --  8.1* 8.6* 8.6* 8.5* 8.4*  MG  --  2.2 1.8  --   --   --   --    < > = values in this interval not displayed.   GFR: Estimated Creatinine Clearance: 53.2 mL/min (by C-G formula based on SCr of 0.68 mg/dL). Liver Function Tests: Recent Labs  Lab 06/19/19 1714  AST 19  ALT 14  ALKPHOS 111  BILITOT 1.3*  PROT 7.3  ALBUMIN 3.4*   No results for input(s): LIPASE, AMYLASE in the last 168 hours. Recent Labs  Lab 06/23/19 1334  AMMONIA <9*   Coagulation Profile: No results for input(s): INR, PROTIME in the last 168 hours. Cardiac Enzymes: No results for input(s): CKTOTAL, CKMB, CKMBINDEX, TROPONINI in the last 168 hours. BNP (last 3 results) No results for input(s): PROBNP in the last 8760 hours. HbA1C: No results for input(s): HGBA1C in the last 72 hours. CBG: Recent Labs  Lab 06/20/19 0503 06/24/19 1126 06/25/19 1030  GLUCAP 123* 75 112*   Lipid Profile: No results for input(s): CHOL, HDL, LDLCALC, TRIG, CHOLHDL, LDLDIRECT in the last 72 hours. Thyroid Function Tests: No results for input(s): TSH, T4TOTAL, FREET4, T3FREE, THYROIDAB in the last 72 hours. Anemia Panel: No  results for input(s): VITAMINB12, FOLATE, FERRITIN, TIBC, IRON, RETICCTPCT in the last 72 hours. Sepsis Labs: Recent Labs  Lab 06/19/19 1721 06/19/19 1722 06/19/19 2005 06/20/19 0106  PROCALCITON  --  0.36  --   --   LATICACIDVEN 3.2*  --  3.0* 1.9    Recent Results (from the past 240 hour(s))  Urine culture     Status: Abnormal   Collection Time: 06/19/19  5:14 PM   Specimen: In/Out Cath Urine  Result Value Ref Range Status   Specimen Description   Final    IN/OUT CATH URINE Performed at [facility], [place] [address] [place], [place] [address]    Special Requests   Final    NONE Performed at [facility], [place] [address] [place] [address] [place], [place] [address]    Culture MULTIPLE ORGANISMS PRESENT, NONE PREDOMINANT (A)  Final   Report Status 06/21/2019 FINAL  Final  Culture, blood (routine x 2)     Status: None   Collection Time: 06/19/19  5:15 PM   Specimen: BLOOD  Result Value Ref Range Status   Specimen Description   Final    BLOOD RIGHT WRIST Performed at [facility] [place] [facility] [address] [place], [place] [address]    Special Requests   Final    BOTTLES DRAWN AEROBIC AND ANAEROBIC Blood Culture adequate volume Performed at [place] [address], [place], [place] [address]    Culture   Final    NO GROWTH 5 DAYS Performed at [facility], [place] [address], [place], [place] [address]    Report Status 06/24/2019 FINAL  Final  Culture, blood (routine x 2)     Status: None   Collection Time: 06/19/19  5:26 PM   Specimen: BLOOD  Result Value Ref Range Status   Specimen Description   Final    BLOOD LEFT ANTECUBITAL Performed at [place] [address] [place], [place] [address]    Special Requests   Final    BOTTLES DRAWN AEROBIC ONLY Blood Culture results may not be optimal due to an inadequate volume of blood received in culture bottles Performed at [place] [address], [place], [place] [address]  Culture   Final    NO GROWTH 5 DAYS Performed at [facility], [address] [place], [place] [address]    Report Status 06/24/2019 FINAL  Final  SARS CORONAVIRUS 2 (TAT 6-24 HRS) Nasopharyngeal Nasopharyngeal Swab     Status: None   Collection Time: 06/20/19  1:06 AM   Specimen: Nasopharyngeal Swab  Result Value Ref Range Status   SARS Coronavirus 2 NEGATIVE NEGATIVE Final    Comment: (NOTE) SARS-CoV-2 target nucleic acids are NOT DETECTED. The SARS-CoV-2 RNA is generally detectable in upper and lower respiratory specimens during the acute phase of infection. Negative results do not preclude SARS-CoV-2 infection, do not rule out co-infections with other pathogens, and should not be used as the sole basis for treatment or other patient management decisions. Negative results must be combined with clinical observations, patient history, and epidemiological information. The expected result is Negative. Fact Sheet for Patients: https://www.fda.gov/media/138098/download Fact Sheet for Healthcare Providers: https://www.fda.gov/media/138095/download This test is not yet approved or cleared by the [place] FDA and  has been authorized for detection and/or diagnosis of SARS-CoV-2 by FDA under an Emergency Use Authorization (EUA). This EUA will remain  in effect (meaning this test can be used) for the duration of the COVID-19 declaration under Section 56 4(b)(1) of the Act, 21 U.S.C. section 360bbb-3(b)(1), unless the authorization is terminated or revoked sooner. Performed at [facility], [address] [place], [place] [address]   MRSA PCR Screening     Status: None   Collection Time: 06/20/19  4:00 PM   Specimen: Nasal Mucosa; Nasopharyngeal  Result Value Ref Range Status   MRSA by PCR NEGATIVE NEGATIVE Final    Comment:        The GeneXpert MRSA Assay (FDA approved for NASAL specimens only), is one  component of a comprehensive MRSA colonization surveillance program. It is not intended to diagnose MRSA infection nor to guide or monitor treatment for MRSA infections. Performed at [facility], [address] [place], [place] [address]          Radiology Studies: No results found.      Scheduled Meds: . acidophilus  1 capsule Oral TID  . apixaban  5 mg Oral BID  . vitamin C  1,000 mg Oral Daily  . Chlorhexidine Gluconate Cloth  6 each Topical Daily  . cholecalciferol  1,000 Units Oral Daily  . diltiazem  90 mg Oral Q8H  . escitalopram  10 mg Oral Daily  . famotidine  20 mg Oral Daily  . fentaNYL  1 patch Transdermal Q72H  . mouth rinse  15 mL Mouth Rinse BID  . metoprolol tartrate  100 mg Oral BID  . OLANZapine  2.5 mg Oral QHS  . predniSONE  10 mg Oral Q breakfast  . sodium chloride flush  10-40 mL Intracatheter Q12H  . vancomycin  125 mg Oral QID   Followed by  . [START ON 07/09/2019] vancomycin  125 mg Oral BID   Followed by  . [START ON 07/16/2019] vancomycin  125 mg Oral Daily   Followed by  . [START ON 07/23/2019] vancomycin  125 mg Oral QODAY   Followed by  . [START ON 08/20/2019] vancomycin  125 mg Oral Q3 days  . vitamin E  400 Units Oral Daily   Continuous Infusions: . sodium chloride 75 mL/hr at 06/26/19 0300     LOS: 7 days    Time spent: 35 minutes.        A , MD [facility]   If 7PM-7AM, please contact night-coverage [email or web address]  06/26/2019, 2:09 PM  

## 2019-06-26 NOTE — Care Management Important Message (Signed)
Important Message  Patient Details IM Letter given to Roque Lias SW Case Manager to present to the Patient Name: Betty Jackson MRN: [id number] Date of Birth: [birth year]   Medicare Important Message Given:  Yes     Kerin Salen 06/26/2019, 10:25 AM

## 2019-06-26 NOTE — Progress Notes (Signed)
Physical Therapy Treatment Patient Details Name: Betty Jackson MRN: [id number] DOB: [birth year] Today's Date: 06/26/2019    History of Present Illness This is an 85 year old female with history of CAD status post PCI, chronic HFpEF, chronic atrial fibrillation, scoliosis, chronic low back pain status post L2-3, L3-4 posterior fusion in 2015 and L5-S1 microdiscectomy in 2017, CKD 3, CVA, hypertension, hyperlipidemia, giant cell arteritis, RA on chronic steroids with recent hospital admission 2/28 06/01/2019 for sepsis secondary to C. difficile colitis who presented to the ED 06/19/10 for further evaluation of low back pain .    PT Comments    The patient was sleeping, aroused and able to mobilize with 2 max assist to stand and pivot to [facility], requiring  More assistance than previous treatment. Continue progressive mobility.   Follow Up Recommendations  SNF;Supervision/Assistance - 24 hour     Equipment Recommendations  None recommended by PT    Recommendations for Other Services       Precautions / Restrictions Precautions Precautions: Fall    Mobility  Bed Mobility Overal bed mobility: Needs Assistance Bed Mobility: Supine to Sit     Supine to sit: Min guard     General bed mobility comments: patient scooted self to bed edge  Transfers   Equipment used: Rolling walker (2 wheeled) Transfers: Sit to/from Stand Sit to Stand: Max assist;+2 safety/equipment;+2 physical assistance Stand pivot transfers: Total assist;+2 physical assistance;+2 safety/equipment       General transfer comment: heavy assist to stand and pivot to BSC, knees and trunk flexed. Stod from [facility] with steady assist and recliner brought up behind the pat.ent.  Ambulation/Gait                 Stairs             Wheelchair Mobility    Modified Rankin (Stroke Patients Only)       Balance Overall balance assessment: Needs assistance   Sitting balance-[name]: Fair     Standing  balance support: Bilateral upper extremity supported;During functional activity Standing balance-[name]: Poor Standing balance comment: Reliant on BUE and external support, knees and trunk flexed.                            Cognition Arousal/Alertness: Lethargic;Suspect due to medications Behavior During Therapy: [facility] for tasks assessed/performed Overall Cognitive Status: Within Functional Limits for tasks assessed                     Current Attention Level: Sustained   Following Commands: Follows one step commands consistently       General Comments: patient aroused and able to participate, perked up      Exercises      General Comments        Pertinent Vitals/Pain Pain Assessment: Faces Faces Pain Scale: Hurts even more Pain Location: right hip and back Pain Descriptors / Indicators: Discomfort;Guarding    Home Living                      Prior Function            PT Goals (current goals can now be found in the care plan section) Progress towards PT goals: Progressing toward goals    Frequency    Min 2X/week      PT Plan Current plan remains appropriate    Co-evaluation  AM-PAC PT "6 Clicks" Mobility   Outcome Measure  Help needed turning from your back to your side while in a flat bed without using bedrails?: A Little Help needed moving from lying on your back to sitting on the side of a flat bed without using bedrails?: A Little Help needed moving to and from a bed to a chair (including a wheelchair)?: A Lot Help needed standing up from a chair using your arms (e.g., wheelchair or bedside chair)?: A Lot Help needed to walk in hospital room?: Total Help needed climbing 3-5 steps with a railing? : Total 6 Click Score: 12    End of Session Equipment Utilized During Treatment: Gait belt Activity Tolerance: Patient tolerated treatment well Patient left: in chair;with call bell/phone within reach;with  chair alarm set Nurse Communication: Mobility status PT Visit Diagnosis: Unsteadiness on feet (R26.81);Muscle weakness (generalized) (M62.81);Pain     Time: [id number] PT Time Calculation (min) (ACUTE ONLY): 34 min  Charges:  $Therapeutic Activity: 23-37 mins                     Tresa Endo PT Acute Rehabilitation Services Pager [phone] Office [phone]    Claretha Cooper 06/26/2019, 3:08 PM

## 2019-06-26 NOTE — TOC Progression Note (Signed)
Transition of Care [facility]) - Progression Note    Patient Details  Name: Betty Jackson MRN: [id number] Date of Birth: 02-21-1935  Transition of Care [facility]) CM/SW [place], [place] Phone Number: 06/26/2019, 10:33 AM  Clinical Narrative:   CSW who covered on Friday updated me with information.  Patient's family is wanting patient to go to [facility] for rehab.  Outgoing CSW had secured PASSR; I filled out FL2 and sent out information to [place].  Waiting to hear back. TOC will continue to follow during the course of hospitalization.     Expected Discharge Plan: Skilled Nursing Facility Barriers to Discharge: SNF Pending bed offer  Expected Discharge Plan and Services Expected Discharge Plan: [place] In-house Referral: Clinical Social Work, Hospice / [place] arrangements for the past 2 months: Single Family Home                                       Social Determinants of Health (SDOH) Interventions    Readmission Risk Interventions Readmission Risk Prevention Plan 06/21/2019 06/01/2019 04/21/2019  Transportation Screening Complete Complete Complete  Medication Review Press photographer) Complete Complete Complete  PCP or Specialist appointment within 3-5 days of discharge Not Complete Complete Complete  PCP/Specialist Appt Not Complete comments likely SNF placement - -  [place] or [place] Not Complete Complete Complete  HRI or Home Care Consult Pt Refusal Comments likely SNF - -  SW Recovery Care/Counseling Consult Complete Complete Complete  Palliative Care Screening Complete - Not Applicable  Skilled Nursing Facility Complete Not Applicable Not Applicable  Some recent data might be hidden

## 2019-06-26 NOTE — NC FL2 (Signed)
[place] LEVEL OF CARE SCREENING TOOL     IDENTIFICATION  Patient Name: Betty Jackson Birthdate: 02/04/1935 Sex: female Admission Date (Current Location): 06/19/2019  [facility] and [place] Number:  Herbalist and Address:  [facility],  [place] [address] [place]      Provider Number: [id number]  Attending Physician Name and Address:  Elmarie Shiley, MD  Relative Name and Phone Number:       Current Level of Care: Hospital Recommended Level of Care: [place] Prior Approval Number:    Date Approved/Denied:   PASRR Number: [id number]  Discharge Plan: SNF    Current Diagnoses: Patient Active Problem List   Diagnosis Date Noted  . HCAP (healthcare-associated pneumonia) 06/19/2019  . Encephalopathy 06/19/2019  . Lumbar degenerative disc disease   . Paroxysmal A-fib ([place])   . Hypomagnesemia   . C. difficile colitis   . Diarrhea 05/29/2019  . CHF (congestive heart failure) ([place]) 05/29/2019  . Recurrent falls 04/30/2019  . Pericardial effusion without cardiac tamponade 03/29/2019  . Hypoalbuminemia 03/27/2019  . Cardiac tamponade   . Pericarditis 03/06/2019  . Fever   . Septic shock ([place])   . Atypical chest pain   . Atrial fibrillation with rapid ventricular response ([place])   . Nausea 01/17/2019  . Acute diverticulitis 01/13/2019  . AKI (acute kidney injury) ([place]) 01/13/2019  . Chronic back pain 01/13/2019  . Tachycardia 01/13/2019  . Hypokalemia 01/13/2019  . Chronic low back pain 07/10/2018  . Influenza A 05/20/2017  . Sepsis ([place]) 05/20/2017  . Acute respiratory failure with hypoxia ([place]) 05/20/2017  . CKD (chronic kidney disease), stage III 05/20/2017  . PAF (paroxysmal atrial fibrillation) ([place]) 05/20/2017  . HTN (hypertension) 05/20/2017  . Giant cell arteritis ([place]) 05/20/2017  . Coronary disease 05/20/2017  . Abnormal urinalysis 05/20/2017  . Osteoporosis 05/08/2016  . Herniated nucleus  pulposus, L5-S1, right 11/04/2015  . Major depressive disorder, recurrent episode, moderate ([place]) 07/16/2013  . Abdominal pain, epigastric 06/22/2013  . Lumbar stenosis 04/25/2013  . Chronic insomnia 02/07/2013  . Elevated transaminase level 02/07/2013  . CVA (cerebral infarction) 10/29/2012  . Visual changes 10/28/2012  . Depression 10/28/2012  . Anxiety state, unspecified 10/28/2012  . Anemia, unspecified 10/28/2012  . Nonspecific (abnormal) findings on radiological and other examination of gastrointestinal tract 01/25/2012  . Hyponatremia 01/24/2012  . Hematuria 01/24/2012  . Enteritis 12/24/2011  . Abdominal pain, other specified site 12/24/2011  . Leg pain, right 02/18/2011  . Temporal arteritis ([place]) 09/01/2010  . Hypertension 09/01/2010  . Hyperlipidemia 09/01/2010  . History of atrial fibrillation 09/01/2010  . CAD (coronary artery disease) 09/01/2010  . GERD (gastroesophageal reflux disease) 09/01/2010  . Insomnia 09/01/2010    Orientation RESPIRATION BLADDER Height & Weight     Self  Normal External catheter Weight: 71.7 kg Height:  5' 4.5" (163.8 cm)  BEHAVIORAL SYMPTOMS/MOOD NEUROLOGICAL BOWEL NUTRITION STATUS  (none) (none) Incontinent Diet(see d/c summary)  AMBULATORY STATUS COMMUNICATION OF NEEDS Skin   Extensive Assist Verbally Normal                       Personal Care Assistance Level of Assistance  Bathing, Feeding, Dressing Bathing Assistance: Maximum assistance Feeding assistance: Independent Dressing Assistance: Maximum assistance     Functional Limitations Info  Sight, Hearing, Speech Sight Info: Adequate Hearing Info: Adequate Speech Info: Adequate    SPECIAL CARE FACTORS FREQUENCY  PT (By licensed PT), OT (By  licensed OT)     PT Frequency: 5X/W OT Frequency: 5X/W            Contractures Contractures Info: Not present    Additional Factors Info  Code Status, Allergies Code Status Info: DNR Allergies Info:  Dextromethorphan           Current Medications (06/26/2019):  This is the current hospital active medication list Current Facility-Administered Medications  Medication Dose Route Frequency Provider Last Rate Last Admin  . 0.9 %  sodium chloride infusion   Intravenous Continuous Regalado, Belkys A, MD 75 mL/hr at 06/26/19 0300 Rate Verify at 06/26/19 0300  . acetaminophen (TYLENOL) tablet 650 mg  650 mg Oral Q6H PRN Regalado, Belkys A, MD   650 mg at 06/22/19 1700   Or  . acetaminophen (TYLENOL) suppository 650 mg  650 mg Rectal Q6H PRN Regalado, Belkys A, MD      . acidophilus (RISAQUAD) capsule 1 capsule  1 capsule Oral TID Regalado, Belkys A, MD   1 capsule at 06/26/19 1012  . apixaban (ELIQUIS) tablet 5 mg  5 mg Oral BID Regalado, Belkys A, MD   5 mg at 06/26/19 1013  . ascorbic acid (VITAMIN C) tablet 1,000 mg  1,000 mg Oral Daily Regalado, Belkys A, MD   1,000 mg at 06/26/19 1013  . Chlorhexidine Gluconate Cloth 2 % PADS 6 each  6 each Topical Daily Regalado, Belkys A, MD   6 each at 06/25/19 1034  . cholecalciferol (VITAMIN D3) tablet 1,000 Units  1,000 Units Oral Daily Regalado, Belkys A, MD   1,000 Units at 06/26/19 1013  . diltiazem (CARDIZEM) tablet 90 mg  90 mg Oral Q8H Blount, Xenia T, NP   90 mg at 06/26/19 [id number]  . escitalopram (LEXAPRO) tablet 10 mg  10 mg Oral Daily Regalado, Belkys A, MD   10 mg at 06/26/19 1014  . famotidine (PEPCID) tablet 20 mg  20 mg Oral Daily Regalado, Belkys A, MD   20 mg at 06/26/19 1013  . fentaNYL (DURAGESIC) 12 MCG/HR 1 patch  1 patch Transdermal Q72H Micheline Rough, MD   1 patch at 06/22/19 1702  . fentaNYL (SUBLIMAZE) injection 12.5 mcg  12.5 mcg Intravenous Q2H PRN Regalado, Belkys A, MD      . HYDROcodone-acetaminophen (NORCO) 10-325 MG per tablet 0.5-1 tablet  0.5-1 tablet Oral Q6H PRN Regalado, Belkys A, MD   1 tablet at 06/26/19 1012  . MEDLINE mouth rinse  15 mL Mouth Rinse BID Regalado, Belkys A, MD   15 mL at 06/26/19 0439  . metoprolol  tartrate (LOPRESSOR) tablet 100 mg  100 mg Oral BID Lovey Newcomer T, NP   100 mg at 06/26/19 1012  . OLANZapine (ZYPREXA) tablet 2.5 mg  2.5 mg Oral QHS Micheline Rough, MD   2.5 mg at 06/25/19 2130  . ondansetron (ZOFRAN) injection 4 mg  4 mg Intravenous Q6H PRN Regalado, Belkys A, MD      . predniSONE (DELTASONE) tablet 10 mg  10 mg Oral Q breakfast Regalado, Belkys A, MD   10 mg at 06/26/19 1013  . sodium chloride flush (NS) 0.9 % injection 10-40 mL  10-40 mL Intracatheter Q12H Regalado, Belkys A, MD   10 mL at 06/23/19 2123  . sodium chloride flush (NS) 0.9 % injection 10-40 mL  10-40 mL Intracatheter PRN Regalado, Belkys A, MD   10 mL at 06/24/19 0457  . vancomycin (VANCOCIN) 50 mg/mL oral solution 125 mg  125 mg Oral QID Regalado,  Belkys A, MD   125 mg at 06/26/19 1014   Followed by  . [START ON 07/09/2019] vancomycin (VANCOCIN) 50 mg/mL oral solution 125 mg  125 mg Oral BID Regalado, Belkys A, MD       Followed by  . [START ON 07/16/2019] vancomycin (VANCOCIN) 50 mg/mL oral solution 125 mg  125 mg Oral Daily Regalado, Belkys A, MD       Followed by  . [START ON 07/23/2019] vancomycin (VANCOCIN) 50 mg/mL oral solution 125 mg  125 mg Oral QODAY Regalado, Belkys A, MD       Followed by  . [START ON 08/20/2019] vancomycin (VANCOCIN) 50 mg/mL oral solution 125 mg  125 mg Oral Q3 days Regalado, Belkys A, MD      . vitamin E capsule 400 Units  400 Units Oral Daily Regalado, Belkys A, MD   400 Units at 06/26/19 1013     Discharge Medications: Please see discharge summary for a list of discharge medications.  Relevant Imaging Results:  Relevant Lab Results:   Additional Information [place], [place]

## 2019-06-26 NOTE — Progress Notes (Signed)
Palliative Care Consult Note  Reason for consult:  Pain management and goals of care  I met today with Betty Jackson.  She is lying in bed.  Her daughter Betty Jackson is a bedside.  She is not confused today and is able to participate in meeting regarding goals moving forward.  Her daughter Betty Jackson joined [place] by phone as well.    I discussed with Betty Jackson her intermittent confusion and changes she has been having to her functional status.  She again expressed hope for rehab to regain functional status and we talked about concern for how well she will tolerate rehab if confusion returns. We reviewed options of trial of rehab (hope to transition to [facility] soon) vs trying to return home with more of a focus on comfort.  She expressed again that she would like to see how much she is able to regain at rehab, and based upon my encounter with her today, this seems very reasonable to me.  She does have concerns about pain management moving forward and requested consideration for restarting fentanyl patch.  She reports that her pain was better controlled with patch in place, and she wishes to have effective regimen in place when she transitions to rehab.  We discussed risk vs benefit of this.  While she was more confused and sleepy the day after starting fentanyl patch, she was also confused both prior to and after removal of patch as well.  Discussed plan to retrial patch, but if she is more sleepy following restart, will plan to d/c it again.       Questions and concerns addressed. PMT will continue to support holistically.  -Pain: Currently denies pain but reports that she felt much better from pain standpoint on fentanyl patch and requested to restart it.  Plan to restart fentanyl 9mg/hr patch.  Continue hydrocodone for breakthrough pain. -Delirium: Improved today.  ? If worsened because of infection.  Continue oral vanc. Continue zyprexa nightly. - Betty Jackson currently follows with Dr. HDonell Sievertoffice for  pain management.  She has been seen most recently by JLeonie Green NP.  Attempted to call to discuss, but she was out of the office due to family emergency. - We had another family meeting to discuss goals again today.  Betty RRaybornremains invested in plan for trial of rehab on discharge.  She is hopeful to improve her functional status and be able to return home.   - Recommend that she be followed by outpatient palliative care with [facility] through [facility] returns home.  I have called and discussed her case with [facility] liaison.  TOTAL TIME: 50 min  Greater than 50% of this time was spent counseling and coordinating care related to the above assessment and plan.  GMicheline Rough MD [facility] [phone]

## 2019-06-27 DIAGNOSIS — M2550 Pain in unspecified joint: Secondary | ICD-10-CM | POA: Diagnosis not present

## 2019-06-27 DIAGNOSIS — Z7401 Bed confinement status: Secondary | ICD-10-CM | POA: Diagnosis not present

## 2019-06-27 DIAGNOSIS — J189 Pneumonia, unspecified organism: Secondary | ICD-10-CM | POA: Diagnosis not present

## 2019-06-27 DIAGNOSIS — Z743 Need for continuous supervision: Secondary | ICD-10-CM | POA: Diagnosis not present

## 2019-06-27 LAB — BASIC METABOLIC PANEL
Anion gap: 7 (ref 5–15)
BUN: 12 mg/dL (ref 8–23)
CO2: 26 mmol/L (ref 22–32)
Calcium: 8.3 mg/dL — ABNORMAL LOW (ref 8.9–10.3)
Chloride: 112 mmol/L — ABNORMAL HIGH (ref 98–111)
Creatinine, Ser: 0.71 mg/dL (ref 0.44–1.00)
GFR calc Af Amer: >60 mL/min (ref >60)
GFR calc non Af Amer: >60 mL/min (ref >60)
Glucose, Bld: 86 mg/dL (ref 70–99)
Potassium: 4 mmol/L (ref 3.5–5.1)
Sodium: 145 mmol/L (ref 135–145)

## 2019-06-27 LAB — SARS CORONAVIRUS 2 (TAT 6-24 HRS): SARS Coronavirus 2: NEGATIVE

## 2019-06-27 MED ORDER — VANCOMYCIN 50 MG/ML ORAL SOLUTION: 125.0000 mg | Freq: Four times a day (QID) | ORAL | 0 refills | Status: AC

## 2019-06-27 MED ORDER — VANCOMYCIN 50 MG/ML ORAL SOLUTION: 125.0000 mg | ORAL | 0 refills | Status: DC

## 2019-06-27 MED ORDER — FENTANYL 12 MCG/HR TD PT72: 1.0000 | MEDICATED_PATCH | TRANSDERMAL | 0 refills | Status: DC

## 2019-06-27 MED ORDER — VANCOMYCIN 50 MG/ML ORAL SOLUTION: 125.0000 mg | Freq: Two times a day (BID) | ORAL | 0 refills | Status: AC

## 2019-06-27 MED ORDER — HYDROCODONE-ACETAMINOPHEN 10-325 MG PO TABS: 0.5000 | ORAL_TABLET | Freq: Four times a day (QID) | ORAL | 0 refills | Status: DC | PRN

## 2019-06-27 MED ORDER — OLANZAPINE 2.5 MG PO TABS: 2.5000 mg | ORAL_TABLET | Freq: Every day | ORAL | 0 refills | Status: DC

## 2019-06-27 MED ORDER — DILTIAZEM HCL 90 MG PO TABS: 90.0000 mg | ORAL_TABLET | Freq: Three times a day (TID) | ORAL | 0 refills | Status: DC

## 2019-06-27 MED ORDER — VANCOMYCIN 50 MG/ML ORAL SOLUTION: 125.0000 mg | Freq: Every day | ORAL | 0 refills | Status: DC

## 2019-06-27 NOTE — Progress Notes (Signed)
Pt is being discharged to [facility] for rehab today pending Covid screening. Discharged instructions including medications and follow up appointments placed in packet for [facility].

## 2019-06-27 NOTE — Progress Notes (Signed)
Escorted off unit via [facility] at 2100, Evening meds given  Daughter  at bedside

## 2019-06-27 NOTE — Consult Note (Signed)
   [facility] CM Inpatient Consult   06/27/2019  LUCINE HILFIKER [birth year] [id number]   [facility] CM Follow up:  Current disposition plan is for skilled nursing facility. [facility] CM community RN made aware.  Netta Cedars, MSN, [facility] Liaison Nurse Mobile Phone [phone]  Toll free office [phone]

## 2019-06-27 NOTE — Discharge Summary (Addendum)
Physician Discharge Summary  Betty Jackson:[id number] DOB: [birth year] DOA: 06/19/2019  PCP: Eulas Post, MD  Admit date: 06/19/2019 Discharge date: 06/27/2019  Admitted From: Home  Disposition:  SNF  Recommendations for Outpatient Follow-up:  1. Follow up with PCP in 1-2 weeks 2. Please obtain BMP/CBC in one week 3. Needs long taper course of oral vancomycin, see order.  4. Needs to work with PT>  5. Please have Palliative care follow on patient at rehab.    Discharge Condition: Stable.  CODE STATUS: DNR Diet recommendation: Heart Healthy   Brief/Interim Summary: 85 year old with past medical history significant for CAD status post PCI, chronic heart failure preserved ejection fraction, chronic A. fib, scoliosis, chronic back pain status post L2-3, L3-L4 posterior fusion in 2015 and L5-S1 micro discectomy in 2017, , CVA, hypertension, giant cell arteritis, RA on chronic steroid with recent hospitalization from 2/28 1-06/01/18/2021 for sepsis secondary to C. difficile colitis who presented to the ED for further evaluation of low back pain for which she has been taking hydrocodone increased dose recently.  Evaluation in the ED patient was found to be febrile with temperature 102, A. fib RVR heart rate up to the 160.  Oxygen saturation in the low mid 90s.  She presented with leukocytosis white count of 16, lactic acidosis, chest x-ray showed left lung base opacity suspicious for pneumonia.  She received Tylenol, IV Cardizem and and ceftriaxone and azithromycin.  Patient was admitted for sepsis, pneumonia worsening chronic back pain.  Pleated treatment for pneumonia with 5 days of ceftriaxone and azithromycin.  She has remained afebrile.  Patient has developed worsening diarrhea. Oral vancomycin was increase to 125 mg four times a day for 14 days, then she will require a long taper course. See order.     1-Sepsis suspected secondary to HAP; -X-ray with left lung base positive which  may represent pneumonia. -Covid negative. -Continue with oral vancomycin due to recent C. difficile infection -WBC normalized.  -Completed 5 days of antibiotics. Ceftriaxone and azithromycin.  -Blood culture no growth to date.  -Resolved.   2-History of C. difficile colitis: Patient with worsening diarrhea. -Discussed case with infectious disease, we should assume that this is recurrence of C. difficile.  Recommend a longer taper of vancomycin. -Increase Vancomycin to 125 mg four time daily, need long taper course, see med rec.  -received  IV fluids.  -Hold lasix.  -no significant diarrhea. Only had one BM yesterday.   3-Acute toxic metabolic encephalopathy, Delirium.  Suspect related to underlying infection and opioid use. Delirium precaution. Frequent orientation.  Started on low dose zyprexa. Continue Patient was not able to sleep well through the night. She also develops diarrhea yesterday.  Worsening confusion 3-28, might be related to Diarrhea, Infection, and sleep deprivation.  Improved. Alert and oriented.   4-Acute on chronic back pain: -Norco was resumed on 3/23.  -Received a dose IV Solu-Medrol. Got more confuse after.  -Palliative care consulted for goals of care and pain management. -PT OT eval, needs SNF.  -ESR and CRP/ elevated. MRI was negative for infection.  -Prior Ischium fracture, repeated MRI. MRI lumbar and thoracic spine stable finding negative for infection.  Pelvis MRI with Partial tear left gluteus Minimus and medius tendons with myotendinous muscular high grade strain.  -Discussed with Dr Terrence Dupont Pelvis MRI result; recommend pain management, weight baring as tolerated, cushion donut. Could consider steroid injection to left ischial tuberosity if patient having pain on that site. Patient denies significant pain on  the left -ESR decreased to 90. Discussed case with Dr Ellene Route, he reviewed MRI, he agrees there is not evidence of infection. He recommend pain  management for back pain.  -Appreciate Dr Domingo Cocking with pain management.  -plan to discharge on fentanyl Patch, and Norco PRN. Needs to be follow by palliative care with care connections through [place] after she returns home.   A. fib with RVR: Oral Cardizem was increased.  Heart rate better controlled. Continue with metoprolol. Continue with Eliquis.  -Chronic heart failure preserved ejection fraction: Hold lasix due to diarrhea.   Rheumatoid arthritis on chronic steroids Continue with prednisone, 10 mg to avoid worsening confusion.   -Hypertension: Continue with Cardizem and metoprolol.  Continue to hold losartan   Discharge Diagnoses:  Principal Problem:   HCAP (healthcare-associated pneumonia) Active Problems:   Sepsis ([place])   Chronic back pain   Atrial fibrillation with rapid ventricular response ([place])   Encephalopathy    Discharge Instructions  Discharge Instructions    Diet - low sodium heart healthy   Complete by: As directed    Increase activity slowly   Complete by: As directed      Allergies as of 06/27/2019      Reactions   Dextromethorphan Rash      Medication List    STOP taking these medications   diltiazem 180 MG 24 hr capsule Commonly known as: CARDIZEM CD   losartan 100 MG tablet Commonly known as: COZAAR   ondansetron 4 MG disintegrating tablet Commonly known as: Zofran ODT   vancomycin 125 MG capsule Commonly known as: VANCOCIN     TAKE these medications   alendronate 70 MG tablet Commonly known as: FOSAMAX Take 1 tablet (70 mg total) by mouth every 7 (seven) days. Take with a full glass of water on an empty stomach.   diltiazem 90 MG tablet Commonly known as: CARDIZEM Take 1 tablet (90 mg total) by mouth every 8 (eight) hours. What changed:   medication strength  how much to take  when to take this   Eliquis 5 MG Tabs tablet Generic drug: apixaban TAKE 1 TABLET BY MOUTH TWICE A DAY What changed: how  much to take   escitalopram 10 MG tablet Commonly known as: LEXAPRO TAKE 1 TABLET BY MOUTH EVERY DAY   famotidine 20 MG tablet Commonly known as: PEPCID Take 1 tablet (20 mg total) by mouth daily.   fentaNYL 12 MCG/HR Commonly known as: Roy 1 patch onto the skin every 3 (three) days. Start taking on: June 29, 2019   ferrous sulfate 325 (65 FE) MG tablet Take 1 tablet (325 mg total) by mouth 2 (two) times daily with a meal.   furosemide 40 MG tablet Commonly known as: LASIX Take 1 tablet (40 mg total) by mouth daily.   HYDROcodone-acetaminophen 10-325 MG tablet Commonly known as: NORCO Take 0.5-1 tablets by mouth every 6 (six) hours as needed for moderate pain.   lactobacillus acidophilus Tabs tablet Take 2 tablets by mouth 3 (three) times daily.   metoprolol tartrate 100 MG tablet Commonly known as: LOPRESSOR Take 1 tablet (100 mg total) by mouth 2 (two) times daily. Please schedule an appt for further refills 1st attempt   OLANZapine 2.5 MG tablet Commonly known as: ZYPREXA Take 1 tablet (2.5 mg total) by mouth at bedtime.   potassium chloride 10 MEQ tablet Commonly known as: KLOR-CON Take 1 tablet (10 mEq total) by mouth daily.   predniSONE 10 MG tablet  Commonly known as: DELTASONE Take 1 tablet (10 mg total) by mouth daily.   vancomycin 50 mg/mL  oral solution Commonly known as: VANCOCIN Take 2.5 mLs (125 mg total) by mouth 4 (four) times daily for 12 days.   vancomycin 50 mg/mL  oral solution Commonly known as: VANCOCIN Take 2.5 mLs (125 mg total) by mouth 2 (two) times daily for 7 days. Start taking on: July 09, 2019   vancomycin 50 mg/mL  oral solution Commonly known as: VANCOCIN Take 2.5 mLs (125 mg total) by mouth daily for 7 days. Start taking on: July 16, 2019   vancomycin 50 mg/mL  oral solution Commonly known as: VANCOCIN Take 2.5 mLs (125 mg total) by mouth every other day for 28 days. Start taking on: July 23, 2019    vancomycin 50 mg/mL  oral solution Commonly known as: VANCOCIN Take 2.5 mLs (125 mg total) by mouth every 3 (three) days for 28 days. Start taking on: Aug 20, 2019   vitamin C 500 MG tablet Commonly known as: ASCORBIC ACID Take 1,000 mg by mouth daily.   Vitamin D-3 25 MCG (1000 UT) Caps Take 1,000 Units by mouth daily.   vitamin E 180 MG (400 UNITS) capsule Generic drug: vitamin E Take 400 Units by mouth daily.       Allergies  Allergen Reactions  . Dextromethorphan Rash    Consultations: Palliative  Procedures/Studies: DG Lumbar Spine 2-3 Views  Result Date: 06/20/2019 CLINICAL DATA:  Chronic lower back pain. EXAM: LUMBAR SPINE - 2-3 VIEW COMPARISON:  Aug 17, 2018 FINDINGS: There is no evidence of an acute lumbar spine fracture. Chronic fracture deformities, with subsequent vertebroplasty, are seen at the levels of T11 and L1. Mild levoscoliosis of the lumbar spine is seen. Bilateral radiopaque pedicle screws are seen at the levels of L2, L3 and L4 with radiopaque operative material seen within the L2-L3 and L3-L4 intervertebral disc spaces. Marked severity endplate sclerosis is seen at the levels of L4-L5 and L5-S1. Moderate severity intervertebral disc space narrowing is also seen at these levels. A total right hip replacement is seen. Radiopaque surgical clips are seen overlying the right upper quadrant. There is marked severity calcification of the abdominal aorta. IMPRESSION: 1. Chronic fracture deformities with subsequent vertebroplasty at T11 and L1. 2. Bilateral radiopaque pedicle screws at L2, L3 and L4 with radiopaque operative material seen within the L2-L3 and L3-L4 intervertebral disc spaces. 3. Marked severity endplate sclerosis at Y7-W2 and L5-S1. Electronically Signed   By: Virgina Norfolk M.D.   On: 06/20/2019 19:21   CT HEAD WO CONTRAST  Result Date: 06/20/2019 CLINICAL DATA:  Initial evaluation for acute encephalopathy. EXAM: CT HEAD WITHOUT CONTRAST  TECHNIQUE: Contiguous axial images were obtained from the base of the skull through the vertex without intravenous contrast. COMPARISON:  Prior head CT from 02/11/2019 FINDINGS: Brain: Examination degraded by motion artifact. Generalized age-related cerebral atrophy with moderate chronic small vessel ischemic disease. No acute intracranial hemorrhage. No acute large vessel territory infarct. No mass lesion, midline shift or mass effect. No hydrocephalus. No extra-axial fluid collection. Vascular: No hyperdense vessel. Calcified atherosclerosis present at skull base. Skull: Scalp soft tissues within normal limits. Calvarium grossly intact. Sinuses/Orbits: Globes and orbital soft tissues demonstrate no acute finding. Paranasal sinuses and mastoid air cells are clear. Other: None. IMPRESSION: 1. No acute intracranial abnormality. 2. Generalized age-related cerebral atrophy with moderate chronic microvascular ischemic disease. Electronically Signed   By: Jeannine Boga M.D.   On: 06/20/2019  01:00   MR THORACIC SPINE WO CONTRAST  Result Date: 06/21/2019 CLINICAL DATA:  Initial evaluation for acute on chronic mid and lower back pain. Currently admitted with sepsis, presumed related to HAP, although evaluate for possible intraspinal infection. EXAM: MRI THORACIC AND LUMBAR SPINE WITHOUT CONTRAST TECHNIQUE: Multiplanar and multiecho pulse sequences of the thoracic and lumbar spine were obtained without intravenous contrast. COMPARISON:  Comparison made with recent MRIs from 04/16/2019. FINDINGS: MRI THORACIC SPINE FINDINGS Alignment: Stable alignment with trace anterolisthesis of T2 on T3 and T3 on T4. 3 mm retrolisthesis of T11 on T12 also unchanged. No interval subluxation or malalignment. Vertebrae: Chronic T11 and L1 compression fractures with sequelae of prior vertebral augmentation again noted. Vertebral body height otherwise maintained without evidence for acute or interval fracture. Bone marrow signal  intensity diffusely heterogeneous but within normal limits. Multiple scattered benign hemangiomata noted. No worrisome osseous lesions. No abnormal marrow edema to suggest acute osteomyelitis or discitis. Cord: No definite cord signal abnormality seen on today's exam. Previously question signal changes at T10-11 and T11-12 related to compressive stenosis not definitely seen. Signal intensity is normal elsewhere within the thoracic spinal cord. Paraspinal and other soft tissues: Paraspinous soft tissues within normal limits. Moderate layering left pleural effusion noted. Partially visualized right lung is largely clear. Disc levels: Multilevel degenerative changes noted within the cervical spine on counter sequence without high-grade stenosis. T1-2: Mild disc bulge. Left greater than right facet hypertrophy. No stenosis. T2-3: Mild disc bulge with superimposed small right paracentral disc protrusion (series 21, image 9). Moderate facet hypertrophy. No significant stenosis. T3-4: Trace anterolisthesis. Diffuse disc bulge, eccentric to the left. Posterior element hypertrophy. No significant spinal stenosis. Foramina remain patent. T4-5: Disc desiccation without disc bulge. Mild facet hypertrophy. No significant stenosis. T5-6: Mild disc bulge. Posterior element hypertrophy. No stenosis. T6-7: No significant disc bulge. Mild facet hypertrophy with prominence of the dorsal epidural fat. No stenosis. T7-8: Minimal disc bulge. Mild facet hypertrophy with prominence of the dorsal epidural fat. No stenosis. T8-9: No significant disc bulge. Mild facet hypertrophy with prominence of the dorsal epidural fat. No stenosis. T9-10: Right paracentral to foraminal disc protrusion (series 21, image 30). Mild facet hypertrophy. No significant stenosis. T10-11: Diffuse disc bulge with intervertebral disc space narrowing. Endplate Schmorl's node deformity with marginal endplate osteophytic spurring. Disc bulging eccentric to the left.  Moderate facet hypertrophy. Resultant moderate spinal stenosis with mild cord flattening. Again, no definite cord signal changes. Moderate left with mild right foraminal narrowing. T11-12: Retrolisthesis. Chronic intervertebral disc space narrowing with diffuse disc bulge and disc desiccation. Reactive endplate changes with marginal endplate spurring. Bilateral facet hypertrophy with prominence of the dorsal epidural fat. Resultant moderate to severe spinal stenosis with mild cord flattening. No definite cord signal changes. Severe left foraminal narrowing. Right neural foramen remains patent. T12-L1: Diffuse disc bulge with disc desiccation. Reactive endplate changes. Moderate bilateral facet hypertrophy. Mild prominence of the dorsal epidural fat. Resultant moderate spinal stenosis. Moderate left foraminal narrowing. No significant right foraminal encroachment. MRI LUMBAR SPINE FINDINGS Segmentation: Standard. Lowest well-formed disc space labeled the L5-S1 level. Alignment: Levoscoliosis. 3 mm retrolisthesis of L1 on L2, with trace anterolisthesis of L3 on L4 and L4 on L5. 4 mm retrolisthesis of L5 on S1. Appearance is stable. Vertebrae: Susceptibility artifact from prior PLIF at L2-3 and L3-4 again seen. Chronic L1 compression deformity with sequelae of prior vertebral augmentation noted. Vertebral body height maintained without evidence for acute or interval fracture. Diffuse heterogeneity of the  underlying bone marrow signal intensity without worrisome osseous lesion. No abnormal marrow edema to suggest acute osteomyelitis discitis or septic arthritis. Conus medullaris and cauda equina: Conus extends to the L1-2 level. Conus and cauda equina appear normal. Paraspinal and other soft tissues: Chronic postoperative changes present within the posterior paraspinous soft tissues. Few subcentimeter cysts noted within the left kidney. Visualized visceral structures otherwise unremarkable. Disc levels: L1-2:  Retrolisthesis. Chronic intervertebral disc space narrowing with diffuse disc bulge and disc desiccation. Disc bulging eccentric to the right with prominent right-sided reactive endplate changes. Moderate facet hypertrophy. Resultant mild canal with mild to moderate right lateral recess stenosis. Mild to moderate right foraminal narrowing. Left foramen widely patent. L2-3: Prior PLIF. No residual spinal stenosis. Foramina are patent. L3-4: Prior PLIF. No residual spinal stenosis. Foramina are patent. L4-5: Trace anterolisthesis. Chronic intervertebral disc space narrowing with diffuse disc bulge and disc desiccation. Disc bulge slightly eccentric to the left. Superimposed reactive endplate changes. There is a small right subarticular disc extrusion with inferior migration (series 6, image 6), similar to previous. Superimposed moderate facet and ligament flavum hypertrophy. Resultant moderate to severe canal with right worse than left lateral recess stenosis. Moderate left L4 foraminal narrowing. L5-S1: Retrolisthesis. Chronic intervertebral disc space narrowing with diffuse disc bulge and disc desiccation. Reactive endplate changes. Moderate to advanced facet and ligament flavum hypertrophy. Suspected changes from prior micro discectomy on the right. Superimposed epidural lipomatosis. Resultant moderate canal with right worse than left lateral recess stenosis, with mild to moderate right worse than left L5 foraminal narrowing. IMPRESSION: MR THORACIC SPINE IMPRESSION 1. Stable appearance of the thoracic spine as compared to 04/16/2019. No acute abnormality or evidence for intraspinal infection. 2. Multifactorial degenerative changes at T10-11 through T12-L1 with resultant moderate to severe diffuse spinal stenosis, stable. No appreciable cord signal changes on today's exam. 3. Moderate to severe left foraminal stenosis at T11-12 through T12-L1, also unchanged. 4. Moderate left pleural effusion. MR LUMBAR SPINE  IMPRESSION 1. Stable appearance of the lumbar spine as compared to 04/16/2019. No acute abnormality or evidence for intraspinal infection. 2. Multifactorial degenerative changes at L4-5 and L5-S1 with resultant moderate to severe canal and bilateral lateral recess stenosis as above. 3. Moderate left L4 and bilateral L5 foraminal narrowing, stable from previous. 4. Prior PLIF at L2-3 and L3-4 without residual stenosis. Electronically Signed   By: Jeannine Boga M.D.   On: 06/21/2019 22:44   MR LUMBAR SPINE WO CONTRAST  Result Date: 06/21/2019 CLINICAL DATA:  Initial evaluation for acute on chronic mid and lower back pain. Currently admitted with sepsis, presumed related to HAP, although evaluate for possible intraspinal infection. EXAM: MRI THORACIC AND LUMBAR SPINE WITHOUT CONTRAST TECHNIQUE: Multiplanar and multiecho pulse sequences of the thoracic and lumbar spine were obtained without intravenous contrast. COMPARISON:  Comparison made with recent MRIs from 04/16/2019. FINDINGS: MRI THORACIC SPINE FINDINGS Alignment: Stable alignment with trace anterolisthesis of T2 on T3 and T3 on T4. 3 mm retrolisthesis of T11 on T12 also unchanged. No interval subluxation or malalignment. Vertebrae: Chronic T11 and L1 compression fractures with sequelae of prior vertebral augmentation again noted. Vertebral body height otherwise maintained without evidence for acute or interval fracture. Bone marrow signal intensity diffusely heterogeneous but within normal limits. Multiple scattered benign hemangiomata noted. No worrisome osseous lesions. No abnormal marrow edema to suggest acute osteomyelitis or discitis. Cord: No definite cord signal abnormality seen on today's exam. Previously question signal changes at T10-11 and T11-12 related to compressive stenosis not  definitely seen. Signal intensity is normal elsewhere within the thoracic spinal cord. Paraspinal and other soft tissues: Paraspinous soft tissues within normal  limits. Moderate layering left pleural effusion noted. Partially visualized right lung is largely clear. Disc levels: Multilevel degenerative changes noted within the cervical spine on counter sequence without high-grade stenosis. T1-2: Mild disc bulge. Left greater than right facet hypertrophy. No stenosis. T2-3: Mild disc bulge with superimposed small right paracentral disc protrusion (series 21, image 9). Moderate facet hypertrophy. No significant stenosis. T3-4: Trace anterolisthesis. Diffuse disc bulge, eccentric to the left. Posterior element hypertrophy. No significant spinal stenosis. Foramina remain patent. T4-5: Disc desiccation without disc bulge. Mild facet hypertrophy. No significant stenosis. T5-6: Mild disc bulge. Posterior element hypertrophy. No stenosis. T6-7: No significant disc bulge. Mild facet hypertrophy with prominence of the dorsal epidural fat. No stenosis. T7-8: Minimal disc bulge. Mild facet hypertrophy with prominence of the dorsal epidural fat. No stenosis. T8-9: No significant disc bulge. Mild facet hypertrophy with prominence of the dorsal epidural fat. No stenosis. T9-10: Right paracentral to foraminal disc protrusion (series 21, image 30). Mild facet hypertrophy. No significant stenosis. T10-11: Diffuse disc bulge with intervertebral disc space narrowing. Endplate Schmorl's node deformity with marginal endplate osteophytic spurring. Disc bulging eccentric to the left. Moderate facet hypertrophy. Resultant moderate spinal stenosis with mild cord flattening. Again, no definite cord signal changes. Moderate left with mild right foraminal narrowing. T11-12: Retrolisthesis. Chronic intervertebral disc space narrowing with diffuse disc bulge and disc desiccation. Reactive endplate changes with marginal endplate spurring. Bilateral facet hypertrophy with prominence of the dorsal epidural fat. Resultant moderate to severe spinal stenosis with mild cord flattening. No definite cord signal  changes. Severe left foraminal narrowing. Right neural foramen remains patent. T12-L1: Diffuse disc bulge with disc desiccation. Reactive endplate changes. Moderate bilateral facet hypertrophy. Mild prominence of the dorsal epidural fat. Resultant moderate spinal stenosis. Moderate left foraminal narrowing. No significant right foraminal encroachment. MRI LUMBAR SPINE FINDINGS Segmentation: Standard. Lowest well-formed disc space labeled the L5-S1 level. Alignment: Levoscoliosis. 3 mm retrolisthesis of L1 on L2, with trace anterolisthesis of L3 on L4 and L4 on L5. 4 mm retrolisthesis of L5 on S1. Appearance is stable. Vertebrae: Susceptibility artifact from prior PLIF at L2-3 and L3-4 again seen. Chronic L1 compression deformity with sequelae of prior vertebral augmentation noted. Vertebral body height maintained without evidence for acute or interval fracture. Diffuse heterogeneity of the underlying bone marrow signal intensity without worrisome osseous lesion. No abnormal marrow edema to suggest acute osteomyelitis discitis or septic arthritis. Conus medullaris and cauda equina: Conus extends to the L1-2 level. Conus and cauda equina appear normal. Paraspinal and other soft tissues: Chronic postoperative changes present within the posterior paraspinous soft tissues. Few subcentimeter cysts noted within the left kidney. Visualized visceral structures otherwise unremarkable. Disc levels: L1-2: Retrolisthesis. Chronic intervertebral disc space narrowing with diffuse disc bulge and disc desiccation. Disc bulging eccentric to the right with prominent right-sided reactive endplate changes. Moderate facet hypertrophy. Resultant mild canal with mild to moderate right lateral recess stenosis. Mild to moderate right foraminal narrowing. Left foramen widely patent. L2-3: Prior PLIF. No residual spinal stenosis. Foramina are patent. L3-4: Prior PLIF. No residual spinal stenosis. Foramina are patent. L4-5: Trace  anterolisthesis. Chronic intervertebral disc space narrowing with diffuse disc bulge and disc desiccation. Disc bulge slightly eccentric to the left. Superimposed reactive endplate changes. There is a small right subarticular disc extrusion with inferior migration (series 6, image 6), similar to previous. Superimposed moderate facet  and ligament flavum hypertrophy. Resultant moderate to severe canal with right worse than left lateral recess stenosis. Moderate left L4 foraminal narrowing. L5-S1: Retrolisthesis. Chronic intervertebral disc space narrowing with diffuse disc bulge and disc desiccation. Reactive endplate changes. Moderate to advanced facet and ligament flavum hypertrophy. Suspected changes from prior micro discectomy on the right. Superimposed epidural lipomatosis. Resultant moderate canal with right worse than left lateral recess stenosis, with mild to moderate right worse than left L5 foraminal narrowing. IMPRESSION: MR THORACIC SPINE IMPRESSION 1. Stable appearance of the thoracic spine as compared to 04/16/2019. No acute abnormality or evidence for intraspinal infection. 2. Multifactorial degenerative changes at T10-11 through T12-L1 with resultant moderate to severe diffuse spinal stenosis, stable. No appreciable cord signal changes on today's exam. 3. Moderate to severe left foraminal stenosis at T11-12 through T12-L1, also unchanged. 4. Moderate left pleural effusion. MR LUMBAR SPINE IMPRESSION 1. Stable appearance of the lumbar spine as compared to 04/16/2019. No acute abnormality or evidence for intraspinal infection. 2. Multifactorial degenerative changes at L4-5 and L5-S1 with resultant moderate to severe canal and bilateral lateral recess stenosis as above. 3. Moderate left L4 and bilateral L5 foraminal narrowing, stable from previous. 4. Prior PLIF at L2-3 and L3-4 without residual stenosis. Electronically Signed   By: Jeannine Boga M.D.   On: 06/21/2019 22:44   MR PELVIS WO  CONTRAST  Result Date: 06/21/2019 CLINICAL DATA:  Acute I have pain, chronic buttocks pain limping EXAM: MRI PELVIS WITHOUT CONTRAST TECHNIQUE: Multiplanar multisequence MR imaging of the pelvis was performed. No intravenous contrast was administered. COMPARISON:  April 17, 2019 FINDINGS: Urinary Tract: The visualized distal ureters and bladder appear unremarkable. Bowel: No bowel wall thickening, distention or surrounding inflammation identified within the pelvis. Scattered colonic diverticula are noted. Vascular/Lymphatic: No enlarged pelvic lymph nodes identified. No significant vascular findings. Reproductive: The patient is status post hysterectomy. No adnexal abnormality is noted. Other: No focal soft tissue mass or soft tissue swelling is seen. Musculoskeletal: Again noted is a nonunited avulsion injury off the left ischial tuberosity. No significant surrounding marrow edema seen. No new osseous fracture is identified. The patient is status post right total hip arthroplasty with surrounding metallic artifact. There is a small left hip joint effusion seen. There is a partial tear seen of the left gluteal medius and minimus tendons at the insertion site. There is feathery signal and fluid seen surrounding the gluteus minimus and medius myotendinous junction. There is also mildly increased feathery signal seen in the bilateral adductor musculature, left greater than right. There is mild fatty atrophy of the muscles surrounding the pelvis. The remainder of the visualized portions of the tendons appear to be intact. There is moderate left hip osteoarthritis with superior joint space loss and diffuse chondral thinning. IMPRESSION: 1. Nonunited left ischial tuberosity avulsion fracture as seen on prior exam. No new acute osseous injury seen. 2. partial insertional tear of the left gluteus minimus and medius tendons with myotendinous muscular high-grade strain. 3. Bilateral, left greater than right adductor  muscular edema/strain. 4. Diffuse muscular atrophy surrounding the pelvis. Electronically Signed   By: Prudencio Pair M.D.   On: 06/21/2019 22:08   CT Abdomen Pelvis W Contrast  Result Date: 05/28/2019 CLINICAL DATA:  85 year old female with abdominal pain, nausea vomiting. EXAM: CT ABDOMEN AND PELVIS WITH CONTRAST TECHNIQUE: Multidetector CT imaging of the abdomen and pelvis was performed using the standard protocol following bolus administration of intravenous contrast. CONTRAST:  171m OMNIPAQUE IOHEXOL 300 MG/ML  SOLN  COMPARISON:  CT of the abdomen pelvis dated 04/15/2019. FINDINGS: Lower chest: Borderline cardiomegaly. Multi vessel coronary vascular calcification. Partially visualized pericardial effusion measures 9 mm in thickness similar to prior CT. Diffuse interstitial and interlobular septal prominence of the visualized lung bases may represent edema. Partially visualized trace left pleural effusion. No intra-abdominal free air or free fluid. Hepatobiliary: The liver is grossly unremarkable. Mild intrahepatic biliary ductal dilatation as well as mild dilatation of the common bile duct likely post cholecystectomy. Pancreas: Unremarkable. No pancreatic ductal dilatation or surrounding inflammatory changes. Spleen: Normal in size without focal abnormality. Adrenals/Urinary Tract: The adrenal glands are unremarkable. There is no hydronephrosis on either side. There is symmetric enhancement and excretion of contrast by both kidneys. Subcentimeter bilateral renal hypodense lesions are too small to characterize. The visualized ureters appear unremarkable. The urinary bladder is suboptimally evaluated due to streak artifact caused by right hip arthroplasty. Stomach/Bowel: There is sigmoid diverticulosis. Mild perisigmoid stranding may be chronic. Mild acute diverticulitis is not excluded. Clinical correlation is recommended. There is no bowel obstruction. The appendix is normal. Vascular/Lymphatic: Moderate  aortoiliac atherosclerotic disease. The IVC is unremarkable. No portal venous gas. There is no adenopathy. Reproductive: Hysterectomy. No adnexal masses. Other: Small fat containing umbilical hernia. Musculoskeletal: Osteopenia with extensive degenerative changes of the spine. L2-L4 posterior fusion, old T11 compression fracture and vertebroplasty as well as L1 vertebroplasty. Total right hip arthroplasty. Similar fragmented appearance of the left ischial tuberosity, previously suggested to demonstrate a pathologic fracture. Clinical correlation is recommended. No new fracture. IMPRESSION: 1. Sigmoid diverticulosis. Mild perisigmoid stranding, likely chronic. Mild acute diverticulitis is favored less likely but not excluded. Clinical correlation is recommended. No bowel obstruction. Normal appendix. 2. Additional nonacute findings as above and similar to prior CT including Aortic Atherosclerosis (ICD10-I70.0). Electronically Signed   By: Anner Crete M.D.   On: 05/28/2019 23:47   DG Chest Port 1 View  Result Date: 06/19/2019 CLINICAL DATA:  Fever. Weakness. EXAM: PORTABLE CHEST 1 VIEW COMPARISON:  05/28/2019 FINDINGS: Mild cardiac enlargement is unchanged. Right lung clear. Mild asymmetric increased opacification within the retrocardiac left lung base. Subsegmental atelectasis within the left midlung. No pleural effusion or edema. IMPRESSION: Left lung base opacity which may represent pneumonia. Electronically Signed   By: Kerby Moors M.D.   On: 06/19/2019 18:12   DG Chest Port 1 View  Result Date: 05/28/2019 CLINICAL DATA:  Fevers and weakness EXAM: PORTABLE CHEST 1 VIEW COMPARISON:  05/25/2019 FINDINGS: Cardiac shadow is again mildly enlarged but accentuated by the portable technique. The lungs are well aerated bilaterally. Very mild vascular congestion is noted increased from the prior study. No interstitial edema is noted. No bony abnormality is seen. IMPRESSION: Mild increased central vascular  congestion new from the prior exam. Electronically Signed   By: Inez [place] M.D.   On: 05/28/2019 21:21     Subjective: She is alert, conversant. She is not confuse today. She is still complaining of back pain, but said it is ok.    Discharge Exam: Vitals:   06/26/19 2211 06/27/19 0619  BP: (!) 154/72 137/67  Pulse: 69 68  Resp: 17 15  Temp: 98.2 F (36.8 C) 98.6 F (37 C)  SpO2: 96% 97%     General: Pt is alert, awake, not in acute distress Cardiovascular: RRR, S1/S2 +, no rubs, no gallops Respiratory: CTA bilaterally, no wheezing, no rhonchi Abdominal: Soft, NT, ND, bowel sounds + Extremities: no edema, no cyanosis    The results of significant  diagnostics from this hospitalization (including imaging, microbiology, ancillary and laboratory) are listed below for reference.     Microbiology: Recent Results (from the past 240 hour(s))  Urine culture     Status: Abnormal   Collection Time: 06/19/19  5:14 PM   Specimen: In/Out Cath Urine  Result Value Ref Range Status   Specimen Description   Final    IN/OUT CATH URINE Performed at [facility] [address] [place], [place] [address]    Special Requests   Final    NONE Performed at [facility], [place] [address] [place], [place] [address]    Culture MULTIPLE ORGANISMS PRESENT, NONE PREDOMINANT (A)  Final   Report Status 06/21/2019 FINAL  Final  Culture, blood (routine x 2)     Status: None   Collection Time: 06/19/19  5:15 PM   Specimen: BLOOD  Result Value Ref Range Status   Specimen Description   Final    BLOOD RIGHT WRIST Performed at [facility] [address], [place], [place] [address]    Special Requests   Final    BOTTLES DRAWN AEROBIC AND ANAEROBIC Blood Culture adequate volume Performed at [facility] [address] [place], [place] [address]    Culture   Final    NO GROWTH 5 DAYS Performed at [facility], [place] [address] [place], [place] [address]    Report Status 06/24/2019 FINAL  Final  Culture, blood (routine x 2)     Status: None   Collection Time: 06/19/19  5:26 PM   Specimen: BLOOD  Result Value Ref Range Status   Specimen Description   Final    BLOOD LEFT ANTECUBITAL Performed at [facility] [address], [place], [place] [address]    Special Requests   Final    BOTTLES DRAWN AEROBIC ONLY Blood Culture results may not be optimal due to an inadequate volume of blood received in culture bottles Performed at [facility] [address], [place], [place] [address]    Culture   Final    NO GROWTH 5 DAYS Performed at [facility], [place] [address], [place],  [address]    Report Status 06/24/2019 FINAL  Final  SARS CORONAVIRUS 2 (TAT 6-24 HRS) Nasopharyngeal Nasopharyngeal Swab     Status: None   Collection Time: 06/20/19  1:06 AM   Specimen: Nasopharyngeal Swab  Result Value Ref Range Status   SARS Coronavirus 2 NEGATIVE NEGATIVE Final    Comment: (NOTE) SARS-CoV-2 target nucleic acids are NOT DETECTED. The SARS-CoV-2 RNA is generally detectable in upper and lower respiratory specimens during the acute phase of infection. Negative results do not preclude SARS-CoV-2 infection, do not rule out co-infections with other pathogens, and should not be used as the sole basis for treatment or other patient management decisions. Negative results must be combined with clinical observations, patient history, and epidemiological information. The expected result is Negative. Fact Sheet for Patients: SugarRoll.be Fact Sheet for Healthcare Providers: https://www.woods-mathews.com/ This test is not yet approved or cleared by the [place] FDA and  has been authorized for detection and/or diagnosis of SARS-CoV-2 by FDA under an Emergency Use Authorization (EUA). This EUA will remain  in effect  (meaning this test can be used) for the duration of the COVID-19 declaration under Section 56 4(b)(1) of the Act, 21 U.S.C. section 360bbb-3(b)(1), unless the authorization is terminated or revoked sooner. Performed at [facility], [place] [address] [place], [place] [address]   MRSA PCR Screening     Status: None   Collection Time: 06/20/19  4:00 PM   Specimen: Nasal Mucosa; Nasopharyngeal  Result Value Ref Range Status   MRSA by PCR NEGATIVE NEGATIVE Final    Comment:        The GeneXpert MRSA Assay (FDA approved for NASAL specimens only), is one component of a comprehensive MRSA colonization surveillance program. It is not intended to diagnose MRSA infection nor to guide or monitor treatment for MRSA infections. Performed at [facility], [place] [address], [place], [place] [address]      Labs: BNP (last 3 results) Recent Labs    05/30/19 0404 05/31/19 0455 06/01/19 0322  BNP 286.1* 506.7* 559.7*   Basic Metabolic Panel: Recent Labs  Lab 06/21/19 0302 06/21/19 0302 06/23/19 0601 06/24/19 0456 06/25/19 0947 06/26/19 0356 06/27/19 0825  NA 139   < > 137 142 140 141 145  K 3.5   < > 3.3* 4.2 3.2* 4.1 4.0  CL 104   < > 102 106 105 105 112*  CO2 24   < > '24 27 24 27 26  ' GLUCOSE 100*   < > 106* 98 119* 92 86  BUN 9   < > 30* 31* '18 14 12  ' CREATININE 0.67   < > 0.90 0.86 0.73 0.68 0.71  CALCIUM 8.1*   < > 8.6* 8.6* 8.5* 8.4* 8.3*  MG 1.8  --   --   --   --   --   --    < > = values in this interval not displayed.   Liver Function Tests: No results for input(s): AST, ALT, ALKPHOS, BILITOT, PROT, ALBUMIN in the last 168 hours. No results for input(s): LIPASE, AMYLASE in the last 168 hours. Recent Labs  Lab 06/23/19 1334  AMMONIA <9*   CBC: Recent Labs  Lab 06/21/19 0302 06/22/19 1033 06/23/19 0601 06/24/19 0456 06/25/19 0947  WBC 15.6* 13.6* 12.8* 8.6 9.8  HGB 10.7* 10.1* 8.9* 9.1* 9.3*  HCT 34.8* 32.2* 27.7* 29.3* 29.3*  MCV  92.8 91.0 89.6 92.4 91.8  PLT 212 242 230 300 294   Cardiac Enzymes: No results for input(s): CKTOTAL, CKMB, CKMBINDEX, TROPONINI in the last 168 hours. BNP: Invalid input(s): POCBNP CBG: Recent Labs  Lab 06/24/19 1126 06/25/19 1030  GLUCAP 75 112*   D-Dimer No results for input(s): DDIMER in the last 72 hours. Hgb A1c No results for input(s): HGBA1C in the last 72 hours. Lipid Profile No results for input(s): CHOL, HDL, LDLCALC, TRIG, CHOLHDL, LDLDIRECT in the last 72 hours. Thyroid function studies No results for input(s): TSH, T4TOTAL, T3FREE, THYROIDAB in the last 72 hours.  Invalid input(s): FREET3 Anemia work up No results for input(s): VITAMINB12, FOLATE, FERRITIN, TIBC, IRON, RETICCTPCT in the last 72 hours. Urinalysis    Component Value Date/Time   COLORURINE YELLOW 06/19/2019 1714   APPEARANCEUR CLEAR 06/19/2019 1714   LABSPEC 1.010 06/19/2019 1714   PHURINE 6.0 06/19/2019 1714   GLUCOSEU NEGATIVE 06/19/2019 1714   GLUCOSEU NEGATIVE 07/31/2016 1018   HGBUR SMALL (A) 06/19/2019 1714   BILIRUBINUR NEGATIVE 06/19/2019 1714   BILIRUBINUR neg 07/31/2016 1012   KETONESUR 15 (A) 06/19/2019 1714   PROTEINUR NEGATIVE 06/19/2019 1714   UROBILINOGEN 0.2 07/31/2016 1018   UROBILINOGEN 0.2 07/31/2016 1012   NITRITE NEGATIVE 06/19/2019 1714   LEUKOCYTESUR TRACE (A) 06/19/2019 1714   Sepsis Labs Invalid input(s): PROCALCITONIN,  WBC,  LACTICIDVEN Microbiology Recent Results (from the past 240 hour(s))  Urine culture     Status: Abnormal   Collection Time: 06/19/19  5:14 PM   Specimen: In/Out Cath Urine  Result Value Ref Range Status   Specimen Description   Final    IN/OUT CATH URINE Performed at [facility] [address] [place], [place] [address]    Special Requests   Final    NONE Performed at [facility], [place] [address] [place], [place] [address]    Culture MULTIPLE ORGANISMS PRESENT, NONE PREDOMINANT (A)  Final    Report Status 06/21/2019 FINAL  Final  Culture, blood (routine x 2)     Status: None   Collection Time: 06/19/19  5:15 PM   Specimen: BLOOD  Result Value Ref Range Status   Specimen Description   Final    BLOOD RIGHT WRIST Performed at [facility] [address], [place], [place] [address]    Special Requests   Final    BOTTLES DRAWN AEROBIC AND ANAEROBIC Blood Culture adequate volume Performed at [facility] [address] [place], [place] [address]    Culture   Final    NO GROWTH 5 DAYS Performed at [facility], [place] [address] [place], [place] [address]    Report Status 06/24/2019 FINAL  Final  Culture, blood (routine x 2)     Status: None   Collection Time: 06/19/19  5:26 PM   Specimen: BLOOD  Result Value Ref Range Status   Specimen Description   Final    BLOOD LEFT ANTECUBITAL Performed at [facility] [address], [place], [place] [address]    Special Requests   Final    BOTTLES DRAWN AEROBIC ONLY Blood Culture results may not be optimal due to an inadequate volume of blood received in culture bottles Performed at [facility] [address], [place], [place] [address]    Culture   Final    NO GROWTH 5 DAYS Performed at [facility], [place] [address], [place], [place] [address]    Report Status 06/24/2019 FINAL  Final  SARS CORONAVIRUS 2 (TAT 6-24 HRS) Nasopharyngeal Nasopharyngeal Swab     Status: None   Collection Time: 06/20/19  1:06 AM   Specimen: Nasopharyngeal Swab  Result Value Ref Range Status   SARS Coronavirus 2 NEGATIVE NEGATIVE Final    Comment: (NOTE) SARS-CoV-2 target nucleic acids are NOT DETECTED. The SARS-CoV-2 RNA is generally detectable in upper and lower respiratory specimens during the acute phase of infection. Negative results do not preclude SARS-CoV-2 infection, do not rule out co-infections with other pathogens, and should not be used as  the sole basis for treatment or other patient management decisions. Negative results must be combined with clinical observations, patient history, and epidemiological information. The expected result is Negative. Fact Sheet for Patients: SugarRoll.be Fact Sheet for Healthcare Providers: https://www.woods-mathews.com/ This test is not yet approved or cleared by the [place] FDA and  has been authorized for detection and/or diagnosis of SARS-CoV-2 by FDA under an Emergency Use Authorization (EUA). This EUA will remain  in effect (meaning this test can be used) for the duration of the COVID-19 declaration under Section 56 4(b)(1) of the Act, 21 U.S.C. section 360bbb-3(b)(1), unless the authorization is terminated or revoked sooner. Performed at [facility], [place] [address], [place],  [address]   MRSA PCR Screening  Status: None   Collection Time: 06/20/19  4:00 PM   Specimen: Nasal Mucosa; Nasopharyngeal  Result Value Ref Range Status   MRSA by PCR NEGATIVE NEGATIVE Final    Comment:        The GeneXpert MRSA Assay (FDA approved for NASAL specimens only), is one component of a comprehensive MRSA colonization surveillance program. It is not intended to diagnose MRSA infection nor to guide or monitor treatment for MRSA infections. Performed at [facility], [place] [address] [place], [place] [address]      Time coordinating discharge: 40 minutes  SIGNED:   Elmarie Shiley, MD  [facility]

## 2019-06-27 NOTE — Progress Notes (Signed)
Occupational Therapy Treatment Patient Details Name: Betty Jackson MRN: [id number] DOB: Jan 16, 1935 Today's Date: 06/27/2019    History of present illness This is an 85 year old female with history of CAD status post PCI, chronic HFpEF, chronic atrial fibrillation, scoliosis, chronic low back pain status post L2-3, L3-4 posterior fusion in 2015 and L5-S1 microdiscectomy in 2017, CKD 3, CVA, hypertension, hyperlipidemia, giant cell arteritis, RA on chronic steroids with recent hospital admission 2/28 06/01/2019 for sepsis secondary to C. difficile colitis who presented to the ED 06/19/10 for further evaluation of low back pain .   OT comments  Pt presents supine in bed pleasant and willing to participate in therapy session. Pt demonstrating bed mobility at minguard assist level, tolerating sitting EOB approx 10 min during completion of seated grooming ADL tasks with setup/supervision throughout. Pt demonstrating LB ADL donning socks while EOB given increased time/effort. She continues to present with general weakness and noted decreased STM and attention this session, requiring intermittent cues for redirection to task at hand. Feel pt remains appropriate for SNF level therapies at time of discharge. Will continue to follow while acutely admitted.   Follow Up Recommendations  SNF;Supervision/Assistance - 24 hour    Equipment Recommendations  Other (comment)(TBD in next venue)          Precautions / Restrictions Restrictions Weight Bearing Restrictions: No       Mobility Bed Mobility Overal bed mobility: Needs Assistance Bed Mobility: Supine to Sit;Sit to Supine;Rolling Rolling: Supervision   Supine to sit: Min guard Sit to supine: Min guard   General bed mobility comments: for safety throughout, rolling to L/R end of session to change bed pad  Transfers Overall transfer level: Needs assistance   Transfers: Lateral/Scoot Transfers          Lateral/Scoot Transfers: Min  guard General transfer comment: lateral scoot towards HOB prior to return to supine, pt able to clear buttocks and transition hips without external assist     Balance Overall balance assessment: Needs assistance Sitting-balance support: No upper extremity supported;Feet supported Sitting balance-[name]: Good                                     ADL either performed or assessed with clinical judgement   ADL Overall ADL's : Needs assistance/impaired Eating/Feeding: Set up;Sitting Eating/Feeding Details (indicate cue type and reason): setup with breakfast tray end of session Grooming: Wash/dry face;Brushing hair;Set up;Supervision/safety;Sitting Grooming Details (indicate cue type and reason): seated EOB               Lower Body Dressing Details (indicate cue type and reason): pt was able to don socks seated EOB given increased time/effort, utilizing figure 4 technique intermittently                General ADL Comments: pt tolerated sitting EOB approx 10 min during session, completing grooming ADL and NT assisting with CHG bath while upright                        Cognition Arousal/Alertness: Awake/alert Behavior During Therapy: WFL for tasks assessed/performed Overall Cognitive Status: Impaired/Different from baseline Area of Impairment: Memory;Following commands;Attention                   Current Attention Level: Sustained Memory: Decreased short-term memory Following Commands: Follows one step commands consistently       General Comments:  pt with decreased recall as to when she can take her next pain meds, adamant that it is time during this session         Exercises     Shoulder Instructions       General Comments      Pertinent Vitals/ Pain       Pain Assessment: Faces Faces Pain Scale: Hurts little more Pain Location: back Pain Descriptors / Indicators: Discomfort;Guarding Pain Intervention(s): Limited activity  within patient's tolerance;Monitored during session;Patient requesting pain meds-RN notified;RN gave pain meds during session  Home Living                                          Prior Functioning/Environment              Frequency  Min 2X/week        Progress Toward Goals  OT Goals(current goals can now be found in the care plan section)  Progress towards OT goals: Progressing toward goals  Acute Rehab OT Goals Patient Stated Goal: to be able to walk OT Goal Formulation: With patient Time For Goal Achievement: 07/05/19 Potential to Achieve Goals: Good ADL Goals Pt Will Perform Grooming: with set-up;sitting Pt Will Perform Upper Body Dressing: with min assist Pt Will Transfer to Toilet: bedside commode;with mod assist Pt/caregiver will Perform Home Exercise Program: Increased ROM;Increased strength;Both right and left upper extremity;With written HEP provided;With Supervision  Plan Discharge plan remains appropriate    Co-evaluation                 AM-PAC OT "6 Clicks" Daily Activity     Outcome Measure   Help from another person eating meals?: A Little Help from another person taking care of personal grooming?: A Little Help from another person toileting, which includes using toliet, bedpan, or urinal?: Total Help from another person bathing (including washing, rinsing, drying)?: A Lot Help from another person to put on and taking off regular upper body clothing?: A Lot Help from another person to put on and taking off regular lower body clothing?: A Lot 6 Click Score: 13    End of Session    OT Visit Diagnosis: Muscle weakness (generalized) (M62.81);Unsteadiness on feet (R26.81)   Activity Tolerance Patient tolerated treatment well   Patient Left in bed;with call bell/phone within reach;with bed alarm set;with nursing/sitter in room(NT present)   Nurse Communication Mobility status        Time: [id number] OT Time Calculation  (min): 24 min  Charges: OT General Charges $OT Visit: 1 Visit OT Treatments $Self Care/Home Management : 23-37 mins  Betty Jackson, OT Acute Rehabilitation Services Pager [phone] Office [phone]    Raymondo Band 06/27/2019, 12:52 PM

## 2019-06-27 NOTE — TOC Transition Note (Addendum)
Transition of Care [facility]) - CM/SW Discharge Note   Patient Details  Name: PAVNEET LISBOA MRN: [id number] Date of Birth: Jan 28, 1935  Transition of [facility]) CM/SW Contact:  Trish Mage, LCSW Phone Number: 06/27/2019, 4:09 PM   Clinical Narrative:   Patient to transfer to [facility] today.  PTAR arranged. Nursing, please call report to [phone], room 605P. TOC sign off.  Addendum:  Authorization [id number]  3 days starting today.  Review on 4/1    Final next level of care: [place] Barriers to Discharge: Barriers Resolved   Patient Goals and CMS Choice Patient states their goals for this hospitalization and ongoing recovery are:: to get her pain under better control CMS Medicare.gov Compare Post Acute Care list provided to:: Patient Represenative (must comment)(daughter) Choice offered to / list presented to : Adult Children  Discharge Placement                       Discharge Plan and Services In-house Referral: Clinical Social Work, Hospice / Palliative Care                                   Social Determinants of Health (SDOH) Interventions     Readmission Risk Interventions Readmission Risk Prevention Plan 06/21/2019 06/01/2019 04/21/2019  Transportation Screening Complete Complete Complete  Medication Review Press photographer) Complete Complete Complete  PCP or Specialist appointment within 3-5 days of discharge Not Complete Complete Complete  PCP/Specialist Appt Not Complete comments likely SNF placement - -  [place] or [place] Not Complete Complete Complete  HRI or Home Care Consult Pt Refusal Comments likely SNF - -  SW Recovery Care/Counseling Consult Complete Complete Complete  Palliative Care Screening Complete - Not Applicable  Skilled Nursing Facility Complete Not Applicable Not Applicable  Some recent data might be hidden

## 2019-06-27 NOTE — Progress Notes (Signed)
RN attempted to call report to [facility] at [phone], lvm. Awaiting call back.

## 2019-06-28 ENCOUNTER — Telehealth: Payer: Self-pay

## 2019-06-28 NOTE — Telephone Encounter (Signed)
Manus Gunning from [place] is asking if you will be attending physican  [phone]

## 2019-06-29 ENCOUNTER — Telehealth: Payer: Self-pay

## 2019-06-29 ENCOUNTER — Other Ambulatory Visit: Payer: Self-pay

## 2019-06-29 MED ORDER — FLUCONAZOLE 100 MG PO TABS: 100.0000 mg | ORAL_TABLET | Freq: Every day | ORAL | 0 refills | Status: DC

## 2019-06-29 NOTE — Telephone Encounter (Signed)
Please advise 

## 2019-06-29 NOTE — Telephone Encounter (Signed)
Ok to send in diflucan 100 mg to take daily x 3 days.

## 2019-06-29 NOTE — Telephone Encounter (Signed)
yes

## 2019-06-29 NOTE — Telephone Encounter (Signed)
Betty Jackson from [place] would like to change pt from enrolling her into hospice to palliative care.    [phone]

## 2019-06-29 NOTE — Telephone Encounter (Signed)
OK 

## 2019-06-29 NOTE — Telephone Encounter (Signed)
JoAnne from [place] is calling requesting this medication to be filled for: OLANZapine (ZYPREXA) 2.5 MG tablet [id number]  This was filled at the hospital and they want to have filled.  Patient also has yeast all around vaginal area, anus, and breast and wants to know if something can be sent in for this?    Back pain has deteriorated due to hospital findings and Fentanyl patch has had large improvement. Pain has progressed. This is an Pharmacist, hospital.  I have scheduled a virtual visit on Monday at 1:15pm for 30 mins for hospital follow up.  Please advise if OK to send Diflucan until patient has virtual visit on Monday.

## 2019-06-29 NOTE — Telephone Encounter (Signed)
Called number provided and spoke to [place] and gave her the verbal OK per Dr. Elease Hashimoto. Manus Gunning verbalized an understanding.

## 2019-06-29 NOTE — Telephone Encounter (Signed)
I have sent the Diflucan to [facility] [address]. They are aware that other question will be addressed on Monday during visit.

## 2019-07-03 ENCOUNTER — Telehealth: Payer: Self-pay | Admitting: Family Medicine

## 2019-07-03 ENCOUNTER — Telehealth (INDEPENDENT_AMBULATORY_CARE_PROVIDER_SITE_OTHER): Payer: Medicare Other | Admitting: Family Medicine

## 2019-07-03 ENCOUNTER — Encounter: Payer: Self-pay | Admitting: Family Medicine

## 2019-07-03 DIAGNOSIS — G8929 Other chronic pain: Secondary | ICD-10-CM

## 2019-07-03 DIAGNOSIS — I480 Paroxysmal atrial fibrillation: Secondary | ICD-10-CM | POA: Diagnosis not present

## 2019-07-03 DIAGNOSIS — A0472 Enterocolitis due to Clostridium difficile, not specified as recurrent: Secondary | ICD-10-CM | POA: Diagnosis not present

## 2019-07-03 DIAGNOSIS — J189 Pneumonia, unspecified organism: Secondary | ICD-10-CM | POA: Diagnosis not present

## 2019-07-03 DIAGNOSIS — M549 Dorsalgia, unspecified: Secondary | ICD-10-CM | POA: Diagnosis not present

## 2019-07-03 MED ORDER — FENTANYL 12 MCG/HR TD PT72: 1.0000 | MEDICATED_PATCH | TRANSDERMAL | 0 refills | Status: DC

## 2019-07-03 MED ORDER — VANCOMYCIN 50 MG/ML ORAL SOLUTION: ORAL | 0 refills | Status: DC

## 2019-07-03 NOTE — Telephone Encounter (Signed)
Betty Jackson from [place] stated the pt was recently discharge from the hospital but while she was in the hospital she was given Zyprexa. She was sleeping well while on it but now that she is discharged she does not have any and Betty Jackson would like to know if her PCP will sent it in?   [facility] [place]: [phone]   Betty Jackson can be reached at [phone] if needed

## 2019-07-03 NOTE — Progress Notes (Signed)
Patient ID: Betty Jackson, female   DOB: 1934/07/30, 85 y.o.   MRN: [id number]  This visit type was conducted due to national recommendations for restrictions regarding the COVID-19 pandemic in an effort to limit this patient's exposure and mitigate transmission in our community.   Virtual Visit via Video Note  I connected with Betty Jackson  on 07/03/19 at  1:15 PM EDT by a video enabled telemedicine application and verified that I am speaking with the correct person using two identifiers.  Location patient: home Location provider:work or home office Persons participating in the virtual visit: patient, provider, and pt daughter- [name].    I discussed the limitations of evaluation and management by telemedicine and the availability of in person appointments. The patient expressed understanding and agreed to proceed.   HPI: Ms. Leboeuf has had multiple recent hospitalizations.  Most recent hospitalization was 3-22 through the 30th.  She has multiple chronic problems including history of CAD, chronic heart failure, atrial fibrillation, chronic back pain with multiple prior surgical procedures, history of CVA, giant cell arteritis, rheumatoid arthritis and recent admission for C. difficile colitis and sepsis.  She presented to the hospital with increasing back pain not managed well on hydrocodone.  She had been followed by pain management.  She was febrile with temperature 102.  She had A. fib with rapid ventricular response with rate 160.  Elevated white count of 16,000.  Left lung opacity suspicious for pneumonia.  Treated with broad-spectrum antibiotics and IV Cardizem  Her respiratory status remained stable.  She developed worsening diarrhea.  Vancomycin dosage increased to 125 mg 4 times daily with plans to do a long taper.  Her blood cultures remain negative.  She has some confusion secondary to acute toxic metabolic encephalopathy during hospitalization.  She was placed on Zyprexa low-dose at night  but has not taken this since discharge.  They have not seen further confusion since her discharge.  Regarding chronic back pain she been on Norco for some time and was getting very poor control.  They transitioned to Duragesic 12 mg patch and patient states she has had the best pain control she has had an many months.  MRI scan showed no evidence for infection.  Pelvic MRI showed partial tear left gluteus minimus and medius tendons  She was continued on Eliquis for her A. fib along with Cardizem and metoprolol.  Discussion regarding disposition.  Recommendation was for palliative care.  She had considered rehab stay with [facility] place for some time but family decided that they wish to pursue palliative care at home.  Her diarrhea symptoms are improved at this point though she still has some mild symptoms intermittently.  Needs refills of her vancomycin and will need her Duragesic by Wednesday if we are willing to take over that.  As above, she has been seen by pain management with back specialist in the past.  They cannot get a follow-up with chronic pain management specialist for couple weeks   ROS: See pertinent positives and negatives per HPI.  Past Medical History:  Diagnosis Date  . Acute respiratory failure with hypoxia ([place]) 05/20/2017  . Anemia, unspecified 10/28/2012  . Anxiety state, unspecified 10/28/2012  . CAD (coronary artery disease)    a. Stent RCA in [facility];  b. Cath approx 2009 - nonobs per pt report.  . Cataract    immature on the left eye  . Chronic insomnia 02/07/2013  . Chronic lower back pain    scoliosis  .  CKD (chronic kidney disease) stage 3, GFR 30-59 ml/min   . Complication of anesthesia    pt has a very high tolerance to meds  . CVA (cerebral infarction) 10/29/2012  . DDD (degenerative disc disease)   . Depression   . Diverticulitis    hx of  . Diverticulosis   . Enteritis   . GERD (gastroesophageal reflux disease) 09/01/2010  . Giant cell arteritis  ([place])   . Hemorrhoids   . Herniated nucleus pulposus, L5-S1, right 11/04/2015  . History of scabies   . HTN (hypertension) 05/20/2017  . Hyperlipidemia    takes Lipitor daily  . Hypertension    takes Amlodipine,Losartan,Metoprolol,and HCTZ daily  . Incontinence of urine   . Insomnia    takes Restoril nightly  . Lumbar stenosis 04/25/2013  . Major depressive disorder, recurrent episode, moderate ([place]) 07/16/2013  . Migraines    "back in my 20's; none since" (10/28/2012)  . Osteoporosis   . PAF (paroxysmal atrial fibrillation) ([place]) 2011   a. lone epidode in 2011 according to notes.  . Rheumatoid arthritis ([place])   . Scoliosis   . Sepsis ([place]) 05/2019  . Sinus bradycardia    a. on chronic bb  . Temporal arteritis ([place]) 2011   a. followed @ [facility]; potential flareup 10/28/2012/notes 10/28/2012  . Vocal cord dysfunction    "they don't operate properly" (10/28/2012)    Past Surgical History:  Procedure Laterality Date  . ABDOMINAL HYSTERECTOMY  ~ 1984   vaginally  . BACK SURGERY  7-51yrs ago   X Stop  . BLADDER SUSPENSION  2001  . BREAST BIOPSY Right   . CATARACT EXTRACTION W/ INTRAOCULAR LENS IMPLANT Right ~ 08/2012  . CHEST TUBE INSERTION Left 03/08/2019   Procedure: Chest Tube Insertion;  Surgeon: Ivin Poot, MD;  Location: [facility];  Service: Thoracic;  Laterality: Left;  . COLONOSCOPY  01/26/2012   Procedure: COLONOSCOPY;  Surgeon: Ladene Artist, MD,FACG;  Location: [facility] ENDOSCOPY;  Service: Endoscopy;  Laterality: N/A;  note the EGD is possible  . CORONARY ANGIOPLASTY WITH STENT PLACEMENT  2006   X 1 stent  . EPIDURAL BLOCK INJECTION    . ESOPHAGOGASTRODUODENOSCOPY  01/26/2012   Procedure: ESOPHAGOGASTRODUODENOSCOPY (EGD);  Surgeon: Ladene Artist, MD,FACG;  Location: [facility] ENDOSCOPY;  Service: Endoscopy;  Laterality: N/A;  . HEMIARTHROPLASTY HIP Right 2012  . IR EPIDUROGRAPHY  04/20/2019  . LAPAROSCOPIC CHOLECYSTECTOMY  2001  . LUMBAR FUSION  03/2013  . LUMBAR  LAMINECTOMY/DECOMPRESSION MICRODISCECTOMY Right 11/04/2015   Procedure: Right Lumbar Five-Sacral One Microdiskectomy;  Surgeon: Kristeen Miss, MD;  Location: [place] NEURO ORS;  Service: Neurosurgery;  Laterality: Right;  Right L5-S1 Microdiskectomy  . moles removed that required stiches     one on leg and one on face  . SUBXYPHOID PERICARDIAL WINDOW N/A 03/08/2019   Procedure: SUBXYPHOID PERICARDIAL WINDOW;  Surgeon: Ivin Poot, MD;  Location: [place];  Service: Thoracic;  Laterality: N/A;  . TEE WITHOUT CARDIOVERSION N/A 03/08/2019   Procedure: TRANSESOPHAGEAL ECHOCARDIOGRAM (TEE);  Surgeon: Prescott Gum, Collier Salina, MD;  Location: [place];  Service: Thoracic;  Laterality: N/A;  . TEMPORAL ARTERY BIOPSY / LIGATION Bilateral 2011  . TONSILLECTOMY AND ADENOIDECTOMY     at age 61  . TUBAL LIGATION  ~ 1982  . X-STOP IMPLANTATION  ~ 2010   "lower back" (10/28/2012)    Family History  Problem Relation Age of Onset  . Brain cancer Mother 40  . Heart attack Father   . Breast cancer  Daughter 20  . Heart disease Other   . Hypertension Other   . Arthritis Neg Hx   . Colon cancer Neg Hx   . Osteoporosis Neg Hx     SOCIAL HX: Non-smoker   Current Outpatient Medications:  .  alendronate (FOSAMAX) 70 MG tablet, Take 1 tablet (70 mg total) by mouth every 7 (seven) days. Take with a full glass of water on an empty stomach., Disp: 4 tablet, Rfl: 11 .  Cholecalciferol (VITAMIN D-3) 25 MCG (1000 UT) CAPS, Take 1,000 Units by mouth daily., Disp: , Rfl:  .  diltiazem (CARDIZEM) 90 MG tablet, Take 1 tablet (90 mg total) by mouth every 8 (eight) hours., Disp: 120 tablet, Rfl: 0 .  ELIQUIS 5 MG TABS tablet, TAKE 1 TABLET BY MOUTH TWICE A DAY (Patient taking differently: Take 5 mg by mouth 2 (two) times daily. ), Disp: 60 tablet, Rfl: 9 .  escitalopram (LEXAPRO) 10 MG tablet, TAKE 1 TABLET BY MOUTH EVERY DAY (Patient taking differently: Take 10 mg by mouth daily. ), Disp: 90 tablet, Rfl: 3 .  famotidine (PEPCID) 20 MG  tablet, Take 1 tablet (20 mg total) by mouth daily., Disp: 30 tablet, Rfl: 1 .  fentaNYL (DURAGESIC) 12 MCG/HR, Place 1 patch onto the skin every 3 (three) days., Disp: 5 patch, Rfl: 0 .  ferrous sulfate 325 (65 FE) MG tablet, Take 1 tablet (325 mg total) by mouth 2 (two) times daily with a meal., Disp: 60 tablet, Rfl: 3 .  fluconazole (DIFLUCAN) 100 MG tablet, Take 1 tablet (100 mg total) by mouth daily. Take for 3 days., Disp: 3 tablet, Rfl: 0 .  furosemide (LASIX) 40 MG tablet, Take 1 tablet (40 mg total) by mouth daily., Disp: 30 tablet, Rfl:  .  HYDROcodone-acetaminophen (NORCO) 10-325 MG tablet, Take 0.5-1 tablets by mouth every 6 (six) hours as needed for moderate pain., Disp: 30 tablet, Rfl: 0 .  lactobacillus acidophilus (BACID) TABS tablet, Take 2 tablets by mouth 3 (three) times daily., Disp: 60 tablet, Rfl: 0 .  metoprolol tartrate (LOPRESSOR) 100 MG tablet, Take 1 tablet (100 mg total) by mouth 2 (two) times daily. Please schedule an appt for further refills 1st attempt, Disp: 180 tablet, Rfl: 0 .  OLANZapine (ZYPREXA) 2.5 MG tablet, Take 1 tablet (2.5 mg total) by mouth at bedtime., Disp: 30 tablet, Rfl: 0 .  potassium chloride SA (KLOR-CON) 10 MEQ tablet, Take 1 tablet (10 mEq total) by mouth daily., Disp: 30 tablet, Rfl: 2 .  predniSONE (DELTASONE) 10 MG tablet, Take 1 tablet (10 mg total) by mouth daily., Disp: , Rfl:  .  vancomycin (VANCOCIN) 50 mg/mL oral solution, Take 2.5 mLs (125 mg total) by mouth 4 (four) times daily for 12 days., Disp: 120 mL, Rfl: 0 .  [START ON 07/16/2019] vancomycin (VANCOCIN) 50 mg/mL oral solution, Take 2.5 mLs (125 mg total) by mouth daily for 7 days., Disp: 17.5 mL, Rfl: 0 .  [START ON 07/23/2019] vancomycin (VANCOCIN) 50 mg/mL oral solution, Take 2.5 mLs (125 mg total) by mouth every other day for 28 days., Disp: 35 mL, Rfl: 0 .  [START ON 08/20/2019] vancomycin (VANCOCIN) 50 mg/mL oral solution, Take 2.5 mLs (125 mg total) by mouth every 3 (three) days  for 28 days., Disp: 22.5 mL, Rfl: 0 .  [START ON 07/09/2019] vancomycin (VANCOCIN) 50 mg/mL oral solution, Take 2.5 mLs (125 mg total) by mouth 2 (two) times daily for 7 days., Disp: 35 mL, Rfl: 0 .  vitamin C (ASCORBIC ACID) 500 MG tablet, Take 1,000 mg by mouth daily. , Disp: , Rfl:  .  vitamin E (VITAMIN E) 400 UNIT capsule, Take 400 Units by mouth daily., Disp: , Rfl:   EXAM:  VITALS per patient if applicable:  GENERAL: alert, oriented, appears well and in no acute distress  HEENT: atraumatic, conjunttiva clear, no obvious abnormalities on inspection of external nose and ears  NECK: normal movements of the head and neck  LUNGS: on inspection no signs of respiratory distress, breathing rate appears normal, no obvious gross SOB, gasping or wheezing  CV: no obvious cyanosis  MS: moves all visible extremities without noticeable abnormality  PSYCH/NEURO: pleasant and cooperative, no obvious depression or anxiety, speech and thought processing grossly intact  ASSESSMENT AND PLAN:  Discussed the following assessment and plan:  #1 severe chronic back pain with history of poor control but improved recently with Duragesic patch 12 mg every 3 days  -Long discussion with family regarding management of her pain.  They would like to consider transition over from chronic pain management.  We explained the need to be good communication between providers. -they are in need of refill of Duragesic patch before they can get in to see pain management (if they do decide to see them again)  #2 history of C. difficile colitis currently treated with long vancomycin taper  -Continue taper as directed and will refill her vancomycin  #3 chronic atrial fibrillation.  Recent increased ventricular response.  Continue with Eliquis as well as diltiazem and metoprolol.  #4 social support/disposition.   Agree with Palliative Care consult.  #5 Recent HCAP- clinically improved.  No fever  -follow up for  fever or increased dyspnea.     I discussed the assessment and treatment plan with the patient. The patient was provided an opportunity to ask questions and all were answered. The patient agreed with the plan and demonstrated an understanding of the instructions.   The patient was advised to call back or seek an in-person evaluation if the symptoms worsen or if the condition fails to improve as anticipated.     Carolann Littler, MD

## 2019-07-03 NOTE — Telephone Encounter (Signed)
Discussed with pt and daughter today and they both would like to try to go without the Zyprexa.  She had received that after some delirium   she is clearer cognitively at this time.

## 2019-07-03 NOTE — Telephone Encounter (Signed)
Please advise 

## 2019-07-04 NOTE — Telephone Encounter (Signed)
Left detailed message for Betty Jackson Dr.Burchette spoke with patient and daughter and nothing needed at this time

## 2019-07-14 ENCOUNTER — Encounter: Payer: Self-pay | Admitting: Family Medicine

## 2019-07-14 ENCOUNTER — Telehealth: Payer: Self-pay | Admitting: Family Medicine

## 2019-07-14 NOTE — Telephone Encounter (Signed)
appt

## 2019-07-14 NOTE — Telephone Encounter (Signed)
Almyra Free from [facility] is wondering if the pt's PCP will send a referral for PT.  She stated that the pt has bilateral pain in both her legs.  Almyra Free stated the pt's L Elbow is swollen, looks like fluid, and warm to the touch and thinks it is due to the fall she had a couple months ago. She would like the PCP to follow up with pt on it.   The last thing Almyra Free stated is that the pt is not sleeping. She has tried numerous things such as melatonin, relaxation, and other additional things PCP recommended but nothing is working. She is wondering if he will prescribe her Trazodone?   Pt does have 24hr caregiver with her at all times.    Almyra Free can be reached at [phone] -ok to leave detailed message

## 2019-07-14 NOTE — Telephone Encounter (Signed)
Lvm detailed mesaage for julie letting her know I sent message to caregiver for her to get an appt with Dr.Burchette

## 2019-07-14 NOTE — Telephone Encounter (Signed)
See mychart message pt needs an apartment

## 2019-07-17 ENCOUNTER — Ambulatory Visit (INDEPENDENT_AMBULATORY_CARE_PROVIDER_SITE_OTHER): Payer: Medicare Other | Admitting: Gastroenterology

## 2019-07-17 ENCOUNTER — Encounter: Payer: Self-pay | Admitting: Gastroenterology

## 2019-07-17 ENCOUNTER — Other Ambulatory Visit: Payer: Self-pay

## 2019-07-17 VITALS — BP 120/70 | HR 50 | Temp 97.5°F | Ht 64.0 in | Wt 144.3 lb

## 2019-07-17 DIAGNOSIS — R933 Abnormal findings on diagnostic imaging of other parts of digestive tract: Secondary | ICD-10-CM

## 2019-07-17 DIAGNOSIS — Z7901 Long term (current) use of anticoagulants: Secondary | ICD-10-CM

## 2019-07-17 DIAGNOSIS — A0472 Enterocolitis due to Clostridium difficile, not specified as recurrent: Secondary | ICD-10-CM

## 2019-07-17 NOTE — Patient Instructions (Signed)
Complete vancomycin pulse taper.  Switch your probiotic to Florastor twice daily and continue for 4 weeks after vancomycin taper is finished.   Take this probiotic while taking all antibiotics.   Thank you for choosing me and [facility].  Pricilla Riffle. Dagoberto Ligas., MD., Marval Regal

## 2019-07-17 NOTE — Progress Notes (Signed)
    History of Present Illness: This is an 85 year old with multiple comorbidities female returning for follow-up of C. difficile colitis.  She is accompanied by her daughter.  Evaluated on March 12 in our office. Please see the office note from Carl Best, NP.  Initially diagnosed in February.  She was placed on a vancomycin pulse taper regimen at her office visit on March 12.  Her diarrhea has essentially resolved.  She states she has a slightly loose stool couple days ago.  Her abdominal pain has abated.  Current Medications, Allergies, Past Medical History, Past Surgical History, Family History and Social History were reviewed in [employer] record.  Physical Exam: General: Well developed, well nourished, elderly, in a wheelchair, no acute distress  Head: Normocephalic and atraumatic Eyes:  sclerae anicteric, EOMI Ears: Normal auditory acuity Mouth: Not examined, mask on during Covid-19 pandemic Lungs: Clear throughout to auscultation Heart: Regular rate and rhythm; no murmurs, rubs or bruits Abdomen: Soft, non tender and non distended. No masses, hepatosplenomegaly or hernias noted. Normal Bowel sounds Rectal: Not done Musculoskeletal: Symmetrical with no gross deformities  Pulses:  Normal pulses noted Extremities: No clubbing, cyanosis, edema or deformities noted Neurological: Alert oriented x 4, grossly nonfocal Psychological:  Alert and cooperative. Normal mood and affect   Assessment and Recommendations:  1.  C. difficile colitis. Abnormal CT of the sigmoid colon likely secondary to C. Difficile.  Previously suspected acute diverticulitis in October 2020.  Diarrhea and abdominal pain have resolved.  Complete entire vancomycin pulse taper regimen as prescribed without missing doses or discontinuing prior to completion - importance was stressed.  Change probiotic to Florastor po twice daily for at least 4 weeks beyond the last dose of vancomycin.   Strongly consider taking Florastor twice daily during any future courses of antibiotics and for at least 2 weeks following completion of antibiotics.  With shared decision making we will not plan further evaluation of her abnormal CT of the sigmoid colon due to her comorbidities. She is at higher risk for procedure and sedation related complications from colonoscopy, flexible sigmoidoscopy.  If she has recurrent abdominal symptoms or findings will reconsider evaluation. Ongoing follow up with PCP. GI follow up prn.   2.  Normocytic anemia without evidence of GI bleeding.  Follow up with PCP.   3.  GERD. Continue famotidine 20 mg po qd. Follow up with PCP.   4. A. fib maintained on Eliquis.  5. Pericarditis, pericardial effusion status post pericardial window in December 2020.  6.  Rheumatoid arthritis on prednisone 10 mg qd.

## 2019-07-18 ENCOUNTER — Ambulatory Visit: Payer: Medicare Other | Admitting: Gastroenterology

## 2019-07-18 NOTE — Progress Notes (Signed)
Virtual Visit via Video Note   This visit type was conducted due to national recommendations for restrictions regarding the COVID-19 Pandemic (e.g. social distancing) in an effort to limit this patient's exposure and mitigate transmission in our community.  Due to her co-morbid illnesses, this patient is at least at moderate risk for complications without adequate follow up.  This format is felt to be most appropriate for this patient at this time.  All issues noted in this document were discussed and addressed.  A limited physical exam was performed with this format.  Please refer to the patient's chart for her consent to telehealth for [facility].   The patient was identified using 2 identifiers.  Date:  07/19/2019   ID:  Betty Jackson, DOB 1934-06-20, MRN [id number]  Patient Location: [place] Provider Location: Office  PCP:  Eulas Post, MD  Cardiologist:  [name], MD Evaluation Performed:  Follow-Up Visit  Chief Complaint:  1 month follow up   History of Present Illness:    Betty Jackson is a 85 y.o. female with with hx of PAF, CAD, HTN, HLD,Chronic diastolic CHF, hx ofpericardial effusions/p window 02/2019, CKD III,  near syncopeand temporal arteritison long term steroids seen for follow up.   Hx of CAD s/p stenting to RCA in 2005. Cardiac catheterization in September 2014 showed patent RCA stent, mild disease in the left circumflex artery and LAD.Last stress Myoview inJune 2019was normal.  Prior hx of recurrent afib in setting of influenza and diverticulitis.   Admitted 02/2019 for septic shock/cardiac temponade 2nd to pericardiac effusion s/psub-xiphoid windowby Dr. Darcey Nora.The effusion was culture negative and pathology only showed inflammation. Started on amiodarone to suppress afib for short term.   Admitted 03/2019 for Left ischium fracture. Cardiology seen the patient for elevated troponinin the setting of possible  sepsis but blood cultures were negative.CT of chest concerning forsmall airway disease versus pneumonitis. Discontinued amiodarone.Echo with normal LV systolic function, no significant valve disease.   Seen by Dr. Angelena Form for follow up 05/25/19. No plan for ischemic eval. Off statin due to leg pain and abdominal pain.  Admitted again 05/28/19 for sepsis 2nd to C. Difficile colitis. Hospital course complicated by recurrent afib RVR and converted with IV digoxin. Increased home Cardizem CD to 240mg  qd in addition to home lopressor.   Seen by me 06/08/2019.  She was having intermittent dizziness and near syncope. Noted slow HR in 50s. Reduced cardizem CD to 180mg  qd and continued lopressor 100mg  BID. She continued to have intermittent diarrhea 2nd to c.diff colitis.   Admitted again for 3/22-3/30 sepsis 2nd to HAP. Due to recurrent afib, cardizem CD changed to long acting.   Seen today for follow up virtually with help of daughter.  Patient reports feeling well since last office visit.  She denies chest pain, shortness of breath, syncope, orthopnea or PND.  Her dizziness has been improved.  Feeling more energetic.  The patient does not have symptoms concerning for COVID-19 infection (fever, chills, cough, or new shortness of breath).    Past Medical History:  Diagnosis Date  . Acute respiratory failure with hypoxia ([place]) 05/20/2017  . Anemia, unspecified 10/28/2012  . Anxiety state, unspecified 10/28/2012  . CAD (coronary artery disease)    a. Stent RCA in [facility];  b. Cath approx 2009 - nonobs per pt report.  . Cataract    immature on the left eye  . Chronic insomnia 02/07/2013  . Chronic lower back pain  scoliosis  . CKD (chronic kidney disease) stage 3, GFR 30-59 ml/min   . Complication of anesthesia    pt has a very high tolerance to meds  . CVA (cerebral infarction) 10/29/2012  . DDD (degenerative disc disease)   . Depression   . Diverticulitis    hx of  .  Diverticulosis   . Enteritis   . GERD (gastroesophageal reflux disease) 09/01/2010  . Giant cell arteritis ([place])   . Hemorrhoids   . Herniated nucleus pulposus, L5-S1, right 11/04/2015  . History of scabies   . HTN (hypertension) 05/20/2017  . Hyperlipidemia    takes Lipitor daily  . Hypertension    takes Amlodipine,Losartan,Metoprolol,and HCTZ daily  . Incontinence of urine   . Insomnia    takes Restoril nightly  . Lumbar stenosis 04/25/2013  . Major depressive disorder, recurrent episode, moderate ([place]) 07/16/2013  . Migraines    "back in my 20's; none since" (10/28/2012)  . Osteoporosis   . PAF (paroxysmal atrial fibrillation) ([place]) 2011   a. lone epidode in 2011 according to notes.  . Rheumatoid arthritis ([place])   . Scoliosis   . Sepsis ([place]) 05/2019  . Sinus bradycardia    a. on chronic bb  . Temporal arteritis ([place]) 2011   a. followed @ [facility]; potential flareup 10/28/2012/notes 10/28/2012  . Vocal cord dysfunction    "they don't operate properly" (10/28/2012)   Past Surgical History:  Procedure Laterality Date  . ABDOMINAL HYSTERECTOMY  ~ 1984   vaginally  . BACK SURGERY  7-81yrs ago   X Stop  . BLADDER SUSPENSION  2001  . BREAST BIOPSY Right   . CATARACT EXTRACTION W/ INTRAOCULAR LENS IMPLANT Right ~ 08/2012  . CHEST TUBE INSERTION Left 03/08/2019   Procedure: Chest Tube Insertion;  Surgeon: Ivin Poot, MD;  Location: [place];  Service: Thoracic;  Laterality: Left;  . COLONOSCOPY  01/26/2012   Procedure: COLONOSCOPY;  Surgeon: Ladene Artist, MD,FACG;  Location: [facility] ENDOSCOPY;  Service: Endoscopy;  Laterality: N/A;  note the EGD is possible  . CORONARY ANGIOPLASTY WITH STENT PLACEMENT  2006   X 1 stent  . EPIDURAL BLOCK INJECTION    . ESOPHAGOGASTRODUODENOSCOPY  01/26/2012   Procedure: ESOPHAGOGASTRODUODENOSCOPY (EGD);  Surgeon: Ladene Artist, MD,FACG;  Location: [facility] ENDOSCOPY;  Service: Endoscopy;  Laterality: N/A;  . HEMIARTHROPLASTY HIP Right 2012  . IR EPIDUROGRAPHY   04/20/2019  . LAPAROSCOPIC CHOLECYSTECTOMY  2001  . LUMBAR FUSION  03/2013  . LUMBAR LAMINECTOMY/DECOMPRESSION MICRODISCECTOMY Right 11/04/2015   Procedure: Right Lumbar Five-Sacral One Microdiskectomy;  Surgeon: Kristeen Miss, MD;  Location: [place] NEURO ORS;  Service: Neurosurgery;  Laterality: Right;  Right L5-S1 Microdiskectomy  . moles removed that required stiches     one on leg and one on face  . SUBXYPHOID PERICARDIAL WINDOW N/A 03/08/2019   Procedure: SUBXYPHOID PERICARDIAL WINDOW;  Surgeon: Ivin Poot, MD;  Location: [place];  Service: Thoracic;  Laterality: N/A;  . TEE WITHOUT CARDIOVERSION N/A 03/08/2019   Procedure: TRANSESOPHAGEAL ECHOCARDIOGRAM (TEE);  Surgeon: Prescott Gum, Collier Salina, MD;  Location: [place];  Service: Thoracic;  Laterality: N/A;  . TEMPORAL ARTERY BIOPSY / LIGATION Bilateral 2011  . TONSILLECTOMY AND ADENOIDECTOMY     at age 85  . TUBAL LIGATION  ~ 1982  . X-STOP IMPLANTATION  ~ 2010   "lower back" (10/28/2012)     Current Meds  Medication Sig  . alendronate (FOSAMAX) 70 MG tablet Take 1 tablet (70 mg total) by mouth every  7 (seven) days. Take with a full glass of water on an empty stomach.  . Cholecalciferol (VITAMIN D-3) 25 MCG (1000 UT) CAPS Take 1,000 Units by mouth daily.  Marland Kitchen diltiazem (CARDIZEM) 60 MG tablet Take 60 mg by mouth 4 (four) times daily.  Marland Kitchen ELIQUIS 5 MG TABS tablet TAKE 1 TABLET BY MOUTH TWICE A DAY  . escitalopram (LEXAPRO) 10 MG tablet TAKE 1 TABLET BY MOUTH EVERY DAY  . famotidine (PEPCID) 20 MG tablet Take 1 tablet (20 mg total) by mouth daily.  . fentaNYL (DURAGESIC) 12 MCG/HR Place 1 patch onto the skin every 3 (three) days.  . ferrous sulfate 325 (65 FE) MG tablet Take 1 tablet (325 mg total) by mouth 2 (two) times daily with a meal.  . furosemide (LASIX) 40 MG tablet Take 1 tablet (40 mg total) by mouth daily.  Marland Kitchen HYDROcodone-acetaminophen (NORCO) 10-325 MG tablet Take 0.5-1 tablets by mouth every 6 (six) hours as needed for moderate pain.  Marland Kitchen  lactobacillus acidophilus (BACID) TABS tablet Take 2 tablets by mouth 3 (three) times daily.  . metoprolol tartrate (LOPRESSOR) 100 MG tablet Take 1 tablet (100 mg total) by mouth 2 (two) times daily. Please schedule an appt for further refills 1st attempt  . potassium chloride SA (KLOR-CON) 10 MEQ tablet Take 1 tablet (10 mEq total) by mouth daily.  . predniSONE (DELTASONE) 10 MG tablet Take 1 tablet (10 mg total) by mouth daily.  . vancomycin (VANCOCIN) 125 MG capsule Take 125 mg by mouth 4 (four) times daily.  . vitamin C (ASCORBIC ACID) 500 MG tablet Take 1,000 mg by mouth daily.   . vitamin E (VITAMIN E) 400 UNIT capsule Take 400 Units by mouth daily.  . [DISCONTINUED] diltiazem (CARDIZEM) 90 MG tablet Take 1 tablet (90 mg total) by mouth every 8 (eight) hours.  . [DISCONTINUED] vancomycin (VANCOCIN) 50 mg/mL oral solution Take 2.5 mLs (125 mg total) by mouth daily for 7 days.     Allergies:   Dextromethorphan   Social History   Tobacco Use  . Smoking status: Never Smoker  . Smokeless tobacco: Never Used  Substance Use Topics  . Alcohol use: Yes    Comment: socially  . Drug use: No     Family Hx: The patient's family history includes Brain cancer (age of onset: 73) in her mother; Breast cancer (age of onset: 5) in her daughter; Heart attack in her father; Heart disease in an other family member; Hypertension in an other family member. There is no history of Arthritis, Colon cancer, or Osteoporosis.  ROS:   Please see the history of present illness.    All other systems reviewed and are negative.   Prior CV studies:   The following studies were reviewed today:  As above  Labs/Other Tests and Data Reviewed:    EKG:  No ECG reviewed.  Recent Labs: 03/06/2019: TSH 3.085 06/01/2019: B Natriuretic Peptide 507.5 06/19/2019: ALT 14 06/21/2019: Magnesium 1.8 06/25/2019: Hemoglobin 9.3; Platelets 294 06/27/2019: BUN 12; Creatinine, Ser 0.71; Potassium 4.0; Sodium 145   Recent  Lipid Panel Lab Results  Component Value Date/Time   CHOL 171 11/14/2018 02:20 PM   CHOL 155 08/18/2017 12:41 PM   TRIG 168.0 (H) 11/14/2018 02:20 PM   HDL 63.40 11/14/2018 02:20 PM   HDL 78 08/18/2017 12:41 PM   CHOLHDL 3 11/14/2018 02:20 PM   LDLCALC 74 11/14/2018 02:20 PM   LDLCALC 51 08/18/2017 12:41 PM    Wt Readings from Last 3  Encounters:  07/19/19 142 lb (64.4 kg)  07/17/19 144 lb 5 oz (65.5 kg)  06/21/19 158 lb 1.1 oz (71.7 kg)     Objective:    Vital Signs:  BP (!) 177/78   Pulse (!) 59   Ht 5\' 4"  (1.626 m)   Wt 142 lb (64.4 kg)   BMI 24.37 kg/m    VITAL SIGNS:  reviewed GEN:  no acute distress EYES:  sclerae anicteric, EOMI - Extraocular Movements Intact RESPIRATORY:  normal respiratory effort, symmetric expansion CARDIOVASCULAR:  no peripheral edema SKIN:  no rash, lesions or ulcers. MUSCULOSKELETAL:  no obvious deformities. NEURO:  alert and oriented x 3, no obvious focal deficit PSYCH:  normal affect  ASSESSMENT & PLAN:    1. Paroxysmal atrial fibrillation His rates short-term course of amiodarone due to concern for small airway disease versus pneumonitis prior admissions.  She has intermittent repeat exacerbation in setting of colitis and pneumonia.  Continue Lopressor 100 mg twice daily.  Consolidate Cardizem to long-acting 240 mg daily.  On Eliquis for anticoagulation.  No bleeding issue. If recurrent afib, may consider EP evaluation.   2.  CAD s/p RCA stenting in 2005 -Patent stent by cath in 2014.  Low risk stress test 08/2017 -Not on aspirin due to need of anticoagulation  3.  Chronic diastolic heart failure -Echo January 2021 showed LV function of 60 to 123456, grade 2 diastolic dysfunction -No orthopnea or PND.  4.  C. difficile colitis -Seen by Dr. Fuller Plan  -Diarrhea improving.  5.  Hx of pericarditis/pericardial effusion s/p pericardial window 02/2019 -No evidence of pericardial effusion by echocardiogram April 17, 2019  6. HTN - labile  at home. Advise to keep long. Medications as above.  COVID-19 Education: The signs and symptoms of COVID-19 were discussed with the patient and how to seek care for testing (follow up with PCP or arrange E-visit).  The importance of social distancing was discussed today.  Time:   Today, I have spent 10 minutes with the patient with telehealth technology discussing the above problems.     Medication Adjustments/Labs and Tests Ordered: Current medicines are reviewed at length with the patient today.  Concerns regarding medicines are outlined above.   Tests Ordered: No orders of the defined types were placed in this encounter.   Medication Changes: Meds ordered this encounter  Medications  . diltiazem (CARDIZEM CD) 240 MG 24 hr capsule    Sig: Take 1 capsule (240 mg total) by mouth daily.    Dispense:  90 capsule    Refill:  3    Follow Up:  Either In Person or Virtual in 3 month(s)  Signed, Leanor Kail, PA  07/19/2019 11:42 AM    [facility]

## 2019-07-19 ENCOUNTER — Other Ambulatory Visit: Payer: Self-pay

## 2019-07-19 ENCOUNTER — Encounter: Payer: Self-pay | Admitting: Family Medicine

## 2019-07-19 ENCOUNTER — Ambulatory Visit (INDEPENDENT_AMBULATORY_CARE_PROVIDER_SITE_OTHER): Payer: Medicare Other | Admitting: Family Medicine

## 2019-07-19 ENCOUNTER — Telehealth (INDEPENDENT_AMBULATORY_CARE_PROVIDER_SITE_OTHER): Payer: Medicare Other | Admitting: Physician Assistant

## 2019-07-19 ENCOUNTER — Encounter: Payer: Self-pay | Admitting: Physician Assistant

## 2019-07-19 VITALS — BP 124/62 | HR 63 | Temp 97.6°F

## 2019-07-19 VITALS — BP 177/78 | HR 59 | Ht 64.0 in | Wt 142.0 lb

## 2019-07-19 DIAGNOSIS — M549 Dorsalgia, unspecified: Secondary | ICD-10-CM | POA: Diagnosis not present

## 2019-07-19 DIAGNOSIS — I110 Hypertensive heart disease with heart failure: Secondary | ICD-10-CM

## 2019-07-19 DIAGNOSIS — I5032 Chronic diastolic (congestive) heart failure: Secondary | ICD-10-CM

## 2019-07-19 DIAGNOSIS — G8929 Other chronic pain: Secondary | ICD-10-CM | POA: Diagnosis not present

## 2019-07-19 DIAGNOSIS — E785 Hyperlipidemia, unspecified: Secondary | ICD-10-CM | POA: Diagnosis not present

## 2019-07-19 DIAGNOSIS — M25422 Effusion, left elbow: Secondary | ICD-10-CM | POA: Diagnosis not present

## 2019-07-19 DIAGNOSIS — I480 Paroxysmal atrial fibrillation: Secondary | ICD-10-CM

## 2019-07-19 DIAGNOSIS — I10 Essential (primary) hypertension: Secondary | ICD-10-CM

## 2019-07-19 DIAGNOSIS — F5104 Psychophysiologic insomnia: Secondary | ICD-10-CM | POA: Diagnosis not present

## 2019-07-19 DIAGNOSIS — I25118 Atherosclerotic heart disease of native coronary artery with other forms of angina pectoris: Secondary | ICD-10-CM | POA: Diagnosis not present

## 2019-07-19 MED ORDER — FENTANYL 12 MCG/HR TD PT72: 1.0000 | MEDICATED_PATCH | TRANSDERMAL | 0 refills | Status: DC

## 2019-07-19 MED ORDER — FENTANYL 12 MCG/HR TD PT72: MEDICATED_PATCH | TRANSDERMAL | 0 refills | Status: DC

## 2019-07-19 MED ORDER — DILTIAZEM HCL ER COATED BEADS 240 MG PO CP24: 240.0000 mg | ORAL_CAPSULE | Freq: Every day | ORAL | 3 refills | Status: DC

## 2019-07-19 MED ORDER — HYDROCODONE-ACETAMINOPHEN 10-325 MG PO TABS: ORAL_TABLET | ORAL | 0 refills | Status: DC

## 2019-07-19 NOTE — Patient Instructions (Signed)
Elbow Bursitis ° °Bursitis is swelling and pain at the tip of the elbow. This happens when fluid builds up in a sac under the skin (bursa). This may also be called olecranon bursitis. °What are the causes? °Elbow bursitis may be caused by: °· Elbow injury, such as falling onto the elbow. °· Leaning on hard surfaces for long periods of time. °· Infection from an injury that breaks the skin near the elbow. °· A bone growth (spur) that forms at the tip of the elbow. °· A medical condition that causes inflammation, such as gout or rheumatoid arthritis. °Sometimes the cause is not known. °What are the signs or symptoms? °The first sign of elbow bursitis is usually swelling at the tip of the elbow. This can grow to be about the size of a golf ball. Swelling may start suddenly or develop gradually. Other symptoms may include: °· Pain when bending or leaning on the elbow. °· Not being able to move the elbow normally. °If bursitis is caused by an infection, you may have: °· Redness, warmth, and tenderness of the elbow. °· Drainage of pus from the swollen area over the elbow, if the skin breaks open. °How is this diagnosed? °This condition may be diagnosed based on: °· Your symptoms and medical history. °· Any recent injuries you have had. °· A physical exam. °· X-rays to check for a bone spur or fracture. °· Draining fluid from the bursa to test it for infection. °· Blood tests to rule out gout or rheumatoid arthritis. °How is this treated? °Treatment for elbow bursitis depends on the cause. Treatment may include: °· Medicines. These may include: °? Over-the-counter medicines to relieve pain and inflammation. °? Antibiotic medicines. °? Injections of anti-inflammatory medicines (steroids). °· Draining fluid from the bursa. °· Wrapping your elbow with a bandage. °· Wearing elbow pads. °If these treatments do not help, you may need surgery to remove the bursa. °Follow these instructions at home: °Medicines °· Take  over-the-counter and prescription medicines only as told by your health care provider. °· If you were prescribed an antibiotic medicine, take it as told by your health care provider. Do not stop taking the antibiotic even if you start to feel better. °Managing pain, stiffness, and swelling ° °· If directed, put ice on your elbow: °? Put ice in a plastic bag. °? Place a towel between your skin and the bag. °? Leave the ice on for 20 minutes, 2-3 times a day. °· If your bursitis is caused by an injury, rest your elbow and wear your bandage as told by your health care provider. °· Use elbow pads or elbow wraps to cushion your elbow as needed. °General instructions °· Avoid any activities that cause elbow pain. Ask your health care provider what activities are safe for you. °· Keep all follow-up visits as told by your health care provider. This is important. °Contact a health care provider if you have: °· A fever. °· Symptoms that do not get better with treatment. °· Pain or swelling that: °? Gets worse. °? Goes away and then comes back. °· Pus draining from your elbow. °Get help right away if you have: °· Trouble moving your arm, hand, or fingers. °Summary °· Elbow bursitis is inflammation of the fluid-filled sac (bursa) between the tip of your elbow bone (olecranon) and your skin. °· Treatment for elbow bursitis depends on the cause. It may include medicines to relieve pain and inflammation, antibiotic medicines, and draining fluid from your elbow. °·   Contact a health care provider if your symptoms do not get better with treatment, or if your symptoms go away and then come back. °This information is not intended to replace advice given to you by your health care provider. Make sure you discuss any questions you have with your health care provider. °Document Revised: 02/26/2017 Document Reviewed: 02/23/2017 °Elsevier Patient Education © 2020 [employer] ° °

## 2019-07-19 NOTE — Progress Notes (Signed)
Subjective:     Patient ID: Betty Jackson, female   DOB: Nov 29, 1934, 85 y.o.   MRN: [id number]  HPI Betty Jackson has multiple chronic problems including history of atrial fibrillation, CAD, congestive heart failure, giant cell arteritis, hypertension, hyper lipidemia, history of pericarditis, history of CVA, chronic back pain, osteoporosis, chronic kidney disease.  She had multiple hospitalizations recently.  She and her husband have been in declining health for several months.  Her husband just went on hospice.  Patient is accompanied by her daughter today Betty Jackson) who has been very helpful for both of them.  Betty Jackson recently was referred to palliative care and they have pretty much 24/7 help at home  Her chronic back pain has been very challenging to manage.  She is not getting good relief with opioids.  She was placed recently on fentanyl 12 mcg patch which has made a tremendous benefit for her back pain.  She is supplementing with occasional hydrocodone.  They are requesting refills of both today.  No current constipation issues.  She has had chronic insomnia for many years.  She tried melatonin previously without relief as well as Benadryl.  She had tried trazodone one time which did not seem to help.  We expressed our reluctance to use benzodiazepines or medicine such as Ambien because of her chronic opioid use  Left elbow swelling.  She rests her elbow frequently.  Thinks she may have bumped this and injured this several weeks ago.  No warmth or erythema.  She has swelling of the olecranon bursa  Past Medical History:  Diagnosis Date  . Acute respiratory failure with hypoxia ([place]) 05/20/2017  . Anemia, unspecified 10/28/2012  . Anxiety state, unspecified 10/28/2012  . CAD (coronary artery disease)    a. Stent RCA in [facility];  b. Cath approx 2009 - nonobs per pt report.  . Cataract    immature on the left eye  . Chronic insomnia 02/07/2013  . Chronic lower back pain    scoliosis  . CKD  (chronic kidney disease) stage 3, GFR 30-59 ml/min   . Complication of anesthesia    pt has a very high tolerance to meds  . CVA (cerebral infarction) 10/29/2012  . DDD (degenerative disc disease)   . Depression   . Diverticulitis    hx of  . Diverticulosis   . Enteritis   . GERD (gastroesophageal reflux disease) 09/01/2010  . Giant cell arteritis ([place])   . Hemorrhoids   . Herniated nucleus pulposus, L5-S1, right 11/04/2015  . History of scabies   . HTN (hypertension) 05/20/2017  . Hyperlipidemia    takes Lipitor daily  . Hypertension    takes Amlodipine,Losartan,Metoprolol,and HCTZ daily  . Incontinence of urine   . Insomnia    takes Restoril nightly  . Lumbar stenosis 04/25/2013  . Major depressive disorder, recurrent episode, moderate ([place]) 07/16/2013  . Migraines    "back in my 20's; none since" (10/28/2012)  . Osteoporosis   . PAF (paroxysmal atrial fibrillation) ([place]) 2011   a. lone epidode in 2011 according to notes.  . Rheumatoid arthritis ([place])   . Scoliosis   . Sepsis ([place]) 05/2019  . Sinus bradycardia    a. on chronic bb  . Temporal arteritis ([place]) 2011   a. followed @ [facility]; potential flareup 10/28/2012/notes 10/28/2012  . Vocal cord dysfunction    "they don't operate properly" (10/28/2012)   Past Surgical History:  Procedure Laterality Date  . ABDOMINAL HYSTERECTOMY  ~ 1984  vaginally  . BACK SURGERY  7-76yrs ago   X Stop  . BLADDER SUSPENSION  2001  . BREAST BIOPSY Right   . CATARACT EXTRACTION W/ INTRAOCULAR LENS IMPLANT Right ~ 08/2012  . CHEST TUBE INSERTION Left 03/08/2019   Procedure: Chest Tube Insertion;  Surgeon: Ivin Poot, MD;  Location: [place];  Service: Thoracic;  Laterality: Left;  . COLONOSCOPY  01/26/2012   Procedure: COLONOSCOPY;  Surgeon: Ladene Artist, MD,FACG;  Location: [facility] ENDOSCOPY;  Service: Endoscopy;  Laterality: N/A;  note the EGD is possible  . CORONARY ANGIOPLASTY WITH STENT PLACEMENT  2006   X 1 stent  . EPIDURAL BLOCK INJECTION     . ESOPHAGOGASTRODUODENOSCOPY  01/26/2012   Procedure: ESOPHAGOGASTRODUODENOSCOPY (EGD);  Surgeon: Ladene Artist, MD,FACG;  Location: [facility] ENDOSCOPY;  Service: Endoscopy;  Laterality: N/A;  . HEMIARTHROPLASTY HIP Right 2012  . IR EPIDUROGRAPHY  04/20/2019  . LAPAROSCOPIC CHOLECYSTECTOMY  2001  . LUMBAR FUSION  03/2013  . LUMBAR LAMINECTOMY/DECOMPRESSION MICRODISCECTOMY Right 11/04/2015   Procedure: Right Lumbar Five-Sacral One Microdiskectomy;  Surgeon: Kristeen Miss, MD;  Location: [place] NEURO ORS;  Service: Neurosurgery;  Laterality: Right;  Right L5-S1 Microdiskectomy  . moles removed that required stiches     one on leg and one on face  . SUBXYPHOID PERICARDIAL WINDOW N/A 03/08/2019   Procedure: SUBXYPHOID PERICARDIAL WINDOW;  Surgeon: Ivin Poot, MD;  Location: [place];  Service: Thoracic;  Laterality: N/A;  . TEE WITHOUT CARDIOVERSION N/A 03/08/2019   Procedure: TRANSESOPHAGEAL ECHOCARDIOGRAM (TEE);  Surgeon: Prescott Gum, Collier Salina, MD;  Location: [place];  Service: Thoracic;  Laterality: N/A;  . TEMPORAL ARTERY BIOPSY / LIGATION Bilateral 2011  . TONSILLECTOMY AND ADENOIDECTOMY     at age 87  . TUBAL LIGATION  ~ 1982  . X-STOP IMPLANTATION  ~ 2010   "lower back" (10/28/2012)    reports that she has never smoked. She has never used smokeless tobacco. She reports current alcohol use. She reports that she does not use drugs. family history includes Brain cancer (age of onset: 46) in her mother; Breast cancer (age of onset: 65) in her daughter; Heart attack in her father; Heart disease in an other family member; Hypertension in an other family member. Allergies  Allergen Reactions  . Dextromethorphan Rash     Review of Systems  Constitutional: Positive for fatigue. Negative for chills and fever.  Respiratory: Negative for cough.   Cardiovascular: Negative for chest pain.  Gastrointestinal: Negative for abdominal pain and constipation.  Genitourinary: Negative for dysuria.  Musculoskeletal:  Positive for back pain.  Psychiatric/Behavioral: Positive for sleep disturbance.       Objective:   Physical Exam Vitals reviewed.  Constitutional:      Appearance: Normal appearance.  Cardiovascular:     Rate and Rhythm: Normal rate.  Pulmonary:     Effort: Pulmonary effort is normal.     Breath sounds: Normal breath sounds. No wheezing or rales.  Musculoskeletal:     Right lower leg: No edema.     Left lower leg: No edema.     Comments: She has large swelling left olecranon bursa but full range of motion elbow no bony tenderness.  No warmth.  No erythema.  Neurological:     Mental Status: She is alert.        Assessment:     #1 chronic nonoperative back pain.  Improved on Duragesic patch  #2 large left olecranon bursa effusion.  No signs of secondary infection  #  3 chronic insomnia    Plan:     -We discussed her bursitis.  We explained that this could be drained but would likely recur as far as the effusion.  We would like to minimize her risk of any infection and decided not to drain this currently.  They know to watch for any signs of secondary infection such as redness or warmth.  Keep pressure/friction off elbow as much as possible.  -Discussed chronic pain management.  Refilled Duragesic patch 12 mcg every 72 hours for 3 months.  We also refilled her hydrocodone 10 mg to take only 1 every 8 hours as needed for breakthrough pain.  Hopefully this will be infrequent use  -Discussed insomnia in some detail.  We expressed our reservation to use benzodiazepine in combination with her opioid management.  They will try melatonin again.  We discussed sleep hygiene  -Routine follow-up in 3 months and sooner as needed  Eulas Post MD [facility]

## 2019-07-19 NOTE — Patient Instructions (Signed)
Your physician has recommended you make the following change in your medication:  START DILTIAZEM 240 MG EVERY DAY  STOP DILTIAZEM 60 MG 4 TIMES A DAY  Your physician recommends that you schedule a follow-up appointment in:  [place]

## 2019-07-22 ENCOUNTER — Other Ambulatory Visit: Payer: Self-pay | Admitting: Cardiovascular Disease

## 2019-07-23 ENCOUNTER — Encounter: Payer: Self-pay | Admitting: Family Medicine

## 2019-07-23 DIAGNOSIS — R531 Weakness: Secondary | ICD-10-CM

## 2019-07-24 NOTE — Telephone Encounter (Signed)
Home health referral has been made for PT

## 2019-08-07 ENCOUNTER — Other Ambulatory Visit: Payer: Self-pay | Admitting: Family Medicine

## 2019-08-07 ENCOUNTER — Encounter: Payer: Self-pay | Admitting: Family Medicine

## 2019-08-07 NOTE — Telephone Encounter (Signed)
Please advise last filled:07/19/19 Last ov :07/19/19

## 2019-08-08 NOTE — Telephone Encounter (Signed)
Betty Jackson was returning [name]'s phone call. She needs a call back at [phone]

## 2019-08-08 NOTE — Telephone Encounter (Signed)
Lvm for pt daughter to call back

## 2019-08-08 NOTE — Telephone Encounter (Signed)
She is currently on Duragesic patch every 3 days which should be providing her main pain control.  Hydrocodone should only be used for severe breakthrough pain.  It sounds like she is taking this fairly frequently and our goal is that this not be needed regularly.  May wish to check with daughter but would like to see if lower milligrams of hydrocodone such as 5 mg would control her breakthrough pain

## 2019-08-08 NOTE — Telephone Encounter (Signed)
lvm for pt daughter to call bac to discuss prescription

## 2019-08-09 ENCOUNTER — Telehealth: Payer: Self-pay | Admitting: Family Medicine

## 2019-08-09 ENCOUNTER — Other Ambulatory Visit: Payer: Self-pay | Admitting: Family Medicine

## 2019-08-09 MED ORDER — HYDROCODONE-ACETAMINOPHEN 10-325 MG PO TABS: ORAL_TABLET | ORAL | 0 refills | Status: DC

## 2019-08-09 NOTE — Telephone Encounter (Signed)
Betty Jackson was let know that medication was sent in

## 2019-08-09 NOTE — Telephone Encounter (Signed)
This has been handled.

## 2019-08-09 NOTE — Telephone Encounter (Signed)
Betty Jackson from connect nurse says that when she takes 10mg  hydrocodone 1 or 2 tablets a day and is still in some pain even with patch and is wondering if we will not prescribe the 10 mg hydrocdone if we will up the patch from 12.5 to 25 fentayl patch

## 2019-08-09 NOTE — Telephone Encounter (Signed)
I refilled the 10 mg

## 2019-08-09 NOTE — Addendum Note (Signed)
Addended by: Eulas Post on: 08/09/2019 12:53 PM   Modules accepted: Orders

## 2019-08-09 NOTE — Telephone Encounter (Signed)
[facility] nurse   Almyra Free would like a call to discuss the pt medication  hydrocodone  [phone]

## 2019-08-11 ENCOUNTER — Other Ambulatory Visit: Payer: Self-pay

## 2019-08-11 ENCOUNTER — Encounter (HOSPITAL_COMMUNITY): Payer: Self-pay

## 2019-08-11 ENCOUNTER — Emergency Department (HOSPITAL_COMMUNITY)
Admission: EM | Admit: 2019-08-11 | Discharge: 2019-08-11 | Disposition: A | Discharge status: Home / Self Care | Payer: Medicare Other | Attending: Emergency Medicine | Admitting: Emergency Medicine

## 2019-08-11 ENCOUNTER — Emergency Department (HOSPITAL_COMMUNITY): Payer: Medicare Other

## 2019-08-11 DIAGNOSIS — Z79899 Other long term (current) drug therapy: Secondary | ICD-10-CM | POA: Diagnosis not present

## 2019-08-11 DIAGNOSIS — I130 Hypertensive heart and chronic kidney disease with heart failure and stage 1 through stage 4 chronic kidney disease, or unspecified chronic kidney disease: Secondary | ICD-10-CM | POA: Insufficient documentation

## 2019-08-11 DIAGNOSIS — Z7901 Long term (current) use of anticoagulants: Secondary | ICD-10-CM | POA: Insufficient documentation

## 2019-08-11 DIAGNOSIS — S79912A Unspecified injury of left hip, initial encounter: Secondary | ICD-10-CM | POA: Diagnosis not present

## 2019-08-11 DIAGNOSIS — Y9389 Activity, other specified: Secondary | ICD-10-CM | POA: Insufficient documentation

## 2019-08-11 DIAGNOSIS — W19XXXA Unspecified fall, initial encounter: Secondary | ICD-10-CM | POA: Diagnosis not present

## 2019-08-11 DIAGNOSIS — Y9289 Other specified places as the place of occurrence of the external cause: Secondary | ICD-10-CM | POA: Insufficient documentation

## 2019-08-11 DIAGNOSIS — Y999 Unspecified external cause status: Secondary | ICD-10-CM | POA: Insufficient documentation

## 2019-08-11 DIAGNOSIS — N183 Chronic kidney disease, stage 3 unspecified: Secondary | ICD-10-CM | POA: Insufficient documentation

## 2019-08-11 DIAGNOSIS — I509 Heart failure, unspecified: Secondary | ICD-10-CM | POA: Diagnosis not present

## 2019-08-11 DIAGNOSIS — M25552 Pain in left hip: Secondary | ICD-10-CM | POA: Diagnosis not present

## 2019-08-11 NOTE — ED Provider Notes (Signed)
[facility] DEPT Provider Note   CSN: [id number] Arrival date & time: 08/11/19  1518     History Chief Complaint  Patient presents with  . Fall    Betty KIRSCHENMAN is a 85 y.o. female.  The history is provided by the patient.  Leg Pain Location:  Hip Injury: yes   Hip location:  L hip Pain details:    Severity:  Mild   Onset quality:  Gradual   Timing:  Intermittent   Progression:  Resolved Chronicity:  New Relieved by:  Nothing Worsened by:  Nothing Associated symptoms: no back pain, no decreased ROM, no fatigue, no fever, no itching, no muscle weakness, no neck pain, no numbness, no stiffness, no swelling and no tingling        Past Medical History:  Diagnosis Date  . Acute respiratory failure with hypoxia ([place]) 05/20/2017  . Anemia, unspecified 10/28/2012  . Anxiety state, unspecified 10/28/2012  . CAD (coronary artery disease)    a. Stent RCA in [facility];  b. Cath approx 2009 - nonobs per pt report.  . Cataract    immature on the left eye  . Chronic insomnia 02/07/2013  . Chronic lower back pain    scoliosis  . CKD (chronic kidney disease) stage 3, GFR 30-59 ml/min   . Complication of anesthesia    pt has a very high tolerance to meds  . CVA (cerebral infarction) 10/29/2012  . DDD (degenerative disc disease)   . Depression   . Diverticulitis    hx of  . Diverticulosis   . Enteritis   . GERD (gastroesophageal reflux disease) 09/01/2010  . Giant cell arteritis ([place])   . Hemorrhoids   . Herniated nucleus pulposus, L5-S1, right 11/04/2015  . History of scabies   . HTN (hypertension) 05/20/2017  . Hyperlipidemia    takes Lipitor daily  . Hypertension    takes Amlodipine,Losartan,Metoprolol,and HCTZ daily  . Incontinence of urine   . Insomnia    takes Restoril nightly  . Lumbar stenosis 04/25/2013  . Major depressive disorder, recurrent episode, moderate ([place]) 07/16/2013  . Migraines    "back in my 20's; none since" (10/28/2012)   . Osteoporosis   . PAF (paroxysmal atrial fibrillation) ([place]) 2011   a. lone epidode in 2011 according to notes.  . Rheumatoid arthritis ([place])   . Scoliosis   . Sepsis ([place]) 05/2019  . Sinus bradycardia    a. on chronic bb  . Temporal arteritis ([place]) 2011   a. followed @ [facility]; potential flareup 10/28/2012/notes 10/28/2012  . Vocal cord dysfunction    "they don't operate properly" (10/28/2012)    Patient Active Problem List   Diagnosis Date Noted  . HCAP (healthcare-associated pneumonia) 06/19/2019  . Encephalopathy 06/19/2019  . Lumbar degenerative disc disease   . Paroxysmal A-fib ([place])   . Hypomagnesemia   . C. difficile colitis   . Diarrhea 05/29/2019  . CHF (congestive heart failure) ([place]) 05/29/2019  . Recurrent falls 04/30/2019  . Pericardial effusion without cardiac tamponade 03/29/2019  . Hypoalbuminemia 03/27/2019  . Cardiac tamponade   . Pericarditis 03/06/2019  . Fever   . Septic shock ([place])   . Atypical chest pain   . Atrial fibrillation with rapid ventricular response ([place])   . Nausea 01/17/2019  . Acute diverticulitis 01/13/2019  . AKI (acute kidney injury) ([place]) 01/13/2019  . Chronic back pain 01/13/2019  . Tachycardia 01/13/2019  . Hypokalemia 01/13/2019  . Chronic low back pain 07/10/2018  .  Influenza A 05/20/2017  . Sepsis ([place]) 05/20/2017  . Acute respiratory failure with hypoxia ([place]) 05/20/2017  . CKD (chronic kidney disease), stage III 05/20/2017  . PAF (paroxysmal atrial fibrillation) ([place]) 05/20/2017  . HTN (hypertension) 05/20/2017  . Giant cell arteritis ([place]) 05/20/2017  . Coronary disease 05/20/2017  . Abnormal urinalysis 05/20/2017  . Osteoporosis 05/08/2016  . Herniated nucleus pulposus, L5-S1, right 11/04/2015  . Major depressive disorder, recurrent episode, moderate ([place]) 07/16/2013  . Abdominal pain, epigastric 06/22/2013  . Lumbar stenosis 04/25/2013  . Chronic insomnia 02/07/2013  . Elevated transaminase level 02/07/2013  . CVA  (cerebral infarction) 10/29/2012  . Visual changes 10/28/2012  . Depression 10/28/2012  . Anxiety state, unspecified 10/28/2012  . Anemia, unspecified 10/28/2012  . Nonspecific (abnormal) findings on radiological and other examination of gastrointestinal tract 01/25/2012  . Hyponatremia 01/24/2012  . Hematuria 01/24/2012  . Enteritis 12/24/2011  . Abdominal pain, other specified site 12/24/2011  . Leg pain, right 02/18/2011  . Temporal arteritis ([place]) 09/01/2010  . Hypertension 09/01/2010  . Hyperlipidemia 09/01/2010  . History of atrial fibrillation 09/01/2010  . CAD (coronary artery disease) 09/01/2010  . GERD (gastroesophageal reflux disease) 09/01/2010  . Insomnia 09/01/2010    Past Surgical History:  Procedure Laterality Date  . ABDOMINAL HYSTERECTOMY  ~ 1984   vaginally  . BACK SURGERY  7-69yrs ago   X Stop  . BLADDER SUSPENSION  2001  . BREAST BIOPSY Right   . CATARACT EXTRACTION W/ INTRAOCULAR LENS IMPLANT Right ~ 08/2012  . CHEST TUBE INSERTION Left 03/08/2019   Procedure: Chest Tube Insertion;  Surgeon: Ivin Poot, MD;  Location: [facility];  Service: Thoracic;  Laterality: Left;  . COLONOSCOPY  01/26/2012   Procedure: COLONOSCOPY;  Surgeon: Ladene Artist, MD,FACG;  Location: [facility] ENDOSCOPY;  Service: Endoscopy;  Laterality: N/A;  note the EGD is possible  . CORONARY ANGIOPLASTY WITH STENT PLACEMENT  2006   X 1 stent  . EPIDURAL BLOCK INJECTION    . ESOPHAGOGASTRODUODENOSCOPY  01/26/2012   Procedure: ESOPHAGOGASTRODUODENOSCOPY (EGD);  Surgeon: Ladene Artist, MD,FACG;  Location: [facility] ENDOSCOPY;  Service: Endoscopy;  Laterality: N/A;  . HEMIARTHROPLASTY HIP Right 2012  . IR EPIDUROGRAPHY  04/20/2019  . LAPAROSCOPIC CHOLECYSTECTOMY  2001  . LUMBAR FUSION  03/2013  . LUMBAR LAMINECTOMY/DECOMPRESSION MICRODISCECTOMY Right 11/04/2015   Procedure: Right Lumbar Five-Sacral One Microdiskectomy;  Surgeon: Kristeen Miss, MD;  Location: [facility] NEURO ORS;  Service: Neurosurgery;   Laterality: Right;  Right L5-S1 Microdiskectomy  . moles removed that required stiches     one on leg and one on face  . SUBXYPHOID PERICARDIAL WINDOW N/A 03/08/2019   Procedure: SUBXYPHOID PERICARDIAL WINDOW;  Surgeon: Ivin Poot, MD;  Location: [facility];  Service: Thoracic;  Laterality: N/A;  . TEE WITHOUT CARDIOVERSION N/A 03/08/2019   Procedure: TRANSESOPHAGEAL ECHOCARDIOGRAM (TEE);  Surgeon: Prescott Gum, Collier Salina, MD;  Location: [facility];  Service: Thoracic;  Laterality: N/A;  . TEMPORAL ARTERY BIOPSY / LIGATION Bilateral 2011  . TONSILLECTOMY AND ADENOIDECTOMY     at age 10  . TUBAL LIGATION  ~ 1982  . X-STOP IMPLANTATION  ~ 2010   "lower back" (10/28/2012)     OB History   No obstetric history on file.     Family History  Problem Relation Age of Onset  . Brain cancer Mother 68  . Heart attack Father   . Breast cancer Daughter 47  . Heart disease Other   . Hypertension Other   . Arthritis  Neg Hx   . Colon cancer Neg Hx   . Osteoporosis Neg Hx     Social History   Tobacco Use  . Smoking status: Never Smoker  . Smokeless tobacco: Never Used  Substance Use Topics  . Alcohol use: Yes    Comment: socially  . Drug use: No    Home Medications Prior to Admission medications   Medication Sig Start Date End Date Taking? Authorizing Provider  alendronate (FOSAMAX) 70 MG tablet Take 1 tablet (70 mg total) by mouth every 7 (seven) days. Take with a full glass of water on an empty stomach. 04/28/19   Burchette, Alinda Sierras, MD  Cholecalciferol (VITAMIN D-3) 25 MCG (1000 UT) CAPS Take 1,000 Units by mouth daily.    [provider]  diltiazem (CARDIZEM CD) 240 MG 24 hr capsule Take 1 capsule (240 mg total) by mouth daily. 07/19/19   Bhagat, Crista Luria, PA  ELIQUIS 5 MG TABS tablet TAKE 1 TABLET BY MOUTH TWICE A DAY 05/15/19   Burnell Blanks, MD  escitalopram (LEXAPRO) 10 MG tablet TAKE 1 TABLET BY MOUTH EVERY DAY 12/14/18   Burchette, Alinda Sierras, MD  famotidine (PEPCID) 20 MG  tablet Take 1 tablet (20 mg total) by mouth daily. 06/02/19   Eugenie Filler, MD  fentaNYL (DURAGESIC) 12 MCG/HR Place 1 patch onto the skin every 3 (three) days. 07/19/19   Burchette, Alinda Sierras, MD  fentaNYL (DURAGESIC) 12 MCG/HR Apply one patch every 72 hours.  May refill in one month 07/19/19   Burchette, Alinda Sierras, MD  fentaNYL (DURAGESIC) 12 MCG/HR Apply one patch every 72 hours..   May refill in two months. 07/19/19   Burchette, Alinda Sierras, MD  ferrous sulfate 325 (65 FE) MG tablet Take 1 tablet (325 mg total) by mouth 2 (two) times daily with a meal. 04/21/19   Oswald Hillock, MD  furosemide (LASIX) 40 MG tablet Take 1 tablet (40 mg total) by mouth daily. 06/03/19   Eugenie Filler, MD  HYDROcodone-acetaminophen [facility]) 10-325 MG tablet Take one tablet by mouth every 8 hours as needed for breakthrough pain. 08/09/19   Burchette, Alinda Sierras, MD  lactobacillus acidophilus (BACID) TABS tablet Take 2 tablets by mouth 3 (three) times daily. 06/01/19   Eugenie Filler, MD  metoprolol tartrate (LOPRESSOR) 100 MG tablet Take 1 tablet (100 mg total) by mouth 2 (two) times daily. 07/24/19   Burnell Blanks, MD  potassium chloride SA (KLOR-CON) 10 MEQ tablet Take 1 tablet (10 mEq total) by mouth daily. 04/21/19   Oswald Hillock, MD  predniSONE (DELTASONE) 10 MG tablet Take 1 tablet (10 mg total) by mouth daily. 04/21/19   Oswald Hillock, MD  vancomycin (VANCOCIN) 125 MG capsule Take 125 mg by mouth 4 (four) times daily. 07/03/19   [provider]  vitamin C (ASCORBIC ACID) 500 MG tablet Take 1,000 mg by mouth daily.     [provider]  vitamin E (VITAMIN E) 400 UNIT capsule Take 400 Units by mouth daily.    [provider]    Allergies    Dextromethorphan  Review of Systems   Review of Systems  Constitutional: Negative for chills, fatigue and fever.  HENT: Negative for ear pain and sore throat.   Eyes: Negative for pain and visual disturbance.  Respiratory: Negative for cough and  shortness of breath.   Cardiovascular: Negative for chest pain and palpitations.  Gastrointestinal: Negative for abdominal pain and vomiting.  Genitourinary: Negative for  dysuria and hematuria.  Musculoskeletal: Positive for arthralgias. Negative for back pain, neck pain and stiffness.  Skin: Negative for color change, itching and rash.  Neurological: Negative for dizziness, tremors, seizures, syncope, facial asymmetry, speech difficulty, weakness, light-headedness, numbness and headaches.  All other systems reviewed and are negative.   Physical Exam Updated Vital Signs BP 139/68 (BP Location: Left Arm)   Pulse (!) 52   Temp 97.9 F (36.6 C) (Oral)   Resp 16   Ht 5' 4.5" (1.638 m)   Wt 64.9 kg   SpO2 95%   BMI 24.17 kg/m   Physical Exam Vitals and nursing note reviewed.  Constitutional:      General: She is not in acute distress.    Appearance: She is well-developed. She is not ill-appearing.  HENT:     Head: Normocephalic and atraumatic.     Nose: Nose normal.     Mouth/Throat:     Mouth: Mucous membranes are moist.  Eyes:     Extraocular Movements: Extraocular movements intact.     Conjunctiva/sclera: Conjunctivae normal.     Pupils: Pupils are equal, round, and reactive to light.  Cardiovascular:     Rate and Rhythm: Normal rate and regular rhythm.     Pulses: Normal pulses.     Heart sounds: Normal heart sounds. No murmur.  Pulmonary:     Effort: Pulmonary effort is normal. No respiratory distress.     Breath sounds: Normal breath sounds.  Abdominal:     Palpations: Abdomen is soft.     Tenderness: There is no abdominal tenderness.  Musculoskeletal:        General: Tenderness (left hip) present. No deformity. Normal range of motion.     Cervical back: Normal range of motion and neck supple.  Skin:    General: Skin is warm and dry.     Capillary Refill: Capillary refill takes less than 2 seconds.  Neurological:     General: No focal deficit present.      Mental Status: She is alert and oriented to person, place, and time.     Cranial Nerves: No cranial nerve deficit.     Sensory: No sensory deficit.     Motor: No weakness.     Coordination: Coordination normal.  Psychiatric:        Mood and Affect: Mood normal.     ED Results / Procedures / Treatments   Labs (all labs ordered are listed, but only abnormal results are displayed) Labs Reviewed - No data to display  EKG None  Radiology DG Hip Unilat With Pelvis 2-3 Views Left  Result Date: 08/11/2019 CLINICAL DATA:  Left hip injury, fall EXAM: DG HIP (WITH OR WITHOUT PELVIS) 2-3V LEFT COMPARISON:  02/11/2019 FINDINGS: Prior right hip replacement. Degenerative changes in the left hip with joint space narrowing and spurring. No fracture, subluxation or dislocation. There appears to be an irregular expansile lesion within the left inferior pubic ramus which is new since prior study. This appears to be mixed lytic and sclerotic. Degenerative changes and postoperative changes in the visualized lower lumbar spine. IMPRESSION: No evidence of left hip fracture. Osteoarthritis in the left hip. Possible mixed lytic and sclerotic expansile lesion within the left inferior pubic ramus, new since prior study. This could be further evaluated with CT or MRI if felt clinically indicated. Electronically Signed   By: Rolm Baptise M.D.   On: 08/11/2019 17:27    Procedures Procedures (including critical care time)  Medications Ordered in  ED Medications - No data to display  ED Course  I have reviewed the triage vital signs and the nursing notes.  Pertinent labs & imaging results that were available during my care of the patient were reviewed by me and considered in my medical decision making (see chart for details).    MDM Rules/Calculators/A&P                      KARLEI ALLOWAY is an 85 year old female with history of hypertension, arthritis, status post right hip replacement who presents the ED with  left hip pain after a fall.  Patient had mechanical fall landing on her left hip.  Did not hit her head or lose consciousness.  Patient is on a blood thinner.  Fall happened several hours ago.  She has some tenderness in the left hip but she has been ambulatory since the fall.  X-ray showed no fracture or dislocation.  There was incidentally a lytic versus sclerotic lesion in the left inferior pubic ramus.  It appears new from prior studies.  She was made aware of this finding and knows to follow-up closely outpatient with primary care for possibly CT or MRI outpatient.  Of note on prior MRIs and CTs there has been some sclerotic changes of the left ischial tuberosity.  Not sure if this correlates.  However she is pain-free and does not appear to have any signs of other injuries on exam.  No concern for intracranial injury.  She understands need for follow-up for imaging as this could be a cancerous finding.  Discharged in good condition and given return precautions.  This chart was dictated using voice recognition software.  Despite best efforts to proofread,  errors can occur which can change the documentation meaning.    Final Clinical Impression(s) / ED Diagnoses Final diagnoses:  Fall, initial encounter    Rx / DC Orders ED Discharge Orders    None       Lennice Sites, DO 08/11/19 1939

## 2019-08-11 NOTE — ED Triage Notes (Signed)
Patient states she was reaching for a phone in the back seat of the car and when she raised up she fell backwards landing on her left hip. Patient c/o left hip pain. Patient denies hitting her head or having LOC.

## 2019-08-11 NOTE — Discharge Instructions (Addendum)
Follow-up with your primary care doctor to have a CT scan or MRI of your left hip to further evaluate bony lesion of the left pelvis as discussed.  Please return to the ED if symptoms worsen.

## 2019-08-14 ENCOUNTER — Encounter: Payer: Self-pay | Admitting: Family Medicine

## 2019-08-14 DIAGNOSIS — M316 Other giant cell arteritis: Secondary | ICD-10-CM | POA: Diagnosis not present

## 2019-08-14 DIAGNOSIS — Z961 Presence of intraocular lens: Secondary | ICD-10-CM | POA: Diagnosis not present

## 2019-08-14 DIAGNOSIS — Z7952 Long term (current) use of systemic steroids: Secondary | ICD-10-CM | POA: Diagnosis not present

## 2019-08-14 DIAGNOSIS — H524 Presbyopia: Secondary | ICD-10-CM | POA: Diagnosis not present

## 2019-08-14 MED ORDER — VANCOMYCIN HCL 125 MG PO CAPS: ORAL_CAPSULE | ORAL | 0 refills | Status: DC

## 2019-08-14 NOTE — Telephone Encounter (Signed)
I sent in refills of the Vancomycin for her.

## 2019-08-21 ENCOUNTER — Encounter: Payer: Self-pay | Admitting: Family Medicine

## 2019-08-21 DIAGNOSIS — R937 Abnormal findings on diagnostic imaging of other parts of musculoskeletal system: Secondary | ICD-10-CM

## 2019-08-23 NOTE — Telephone Encounter (Signed)
Discussed issues outlined with Betty Jackson:  -expressed our condolences regarding her father.  -discussed recent pelvic x-ray with recommendation to consider CT or MRI to further assess.   Even though she is fairly debilitated, they would like to proceed with imaging  -expressed our concern re: mixing of benzo with her chronic pain meds (especially duragesic)

## 2019-08-29 ENCOUNTER — Encounter: Payer: Self-pay | Admitting: Family Medicine

## 2019-08-30 ENCOUNTER — Other Ambulatory Visit: Payer: Self-pay

## 2019-08-30 NOTE — Telephone Encounter (Signed)
Pts daughter is calling in wanting to know if the fentanyl patch can be called in today due to her mother being in pain and are out of the patch.  Pharm:  [facility] on [address]

## 2019-09-01 ENCOUNTER — Telehealth (INDEPENDENT_AMBULATORY_CARE_PROVIDER_SITE_OTHER): Payer: Medicare Other | Admitting: Family Medicine

## 2019-09-01 ENCOUNTER — Telehealth: Payer: Self-pay | Admitting: Family Medicine

## 2019-09-01 DIAGNOSIS — L03114 Cellulitis of left upper limb: Secondary | ICD-10-CM

## 2019-09-01 MED ORDER — CEPHALEXIN 500 MG PO CAPS: 500.0000 mg | ORAL_CAPSULE | Freq: Three times a day (TID) | ORAL | 0 refills | Status: DC

## 2019-09-01 NOTE — Progress Notes (Signed)
Patient ID: Betty Jackson, female   DOB: 1934/12/23, 85 y.o.   MRN: [id number]  This visit type was conducted due to national recommendations for restrictions regarding the COVID-19 pandemic in an effort to limit this patient's exposure and mitigate transmission in our community.   Virtual Visit via Video Note  I connected with Betty Jackson on 09/01/19 at  3:45 PM EDT by a video enabled telemedicine application and verified that I am speaking with the correct person using two identifiers.  Location patient: home Location provider:work or home office Persons participating in the virtual visit: patient, provider  I discussed the limitations of evaluation and management by telemedicine and the availability of in person appointments. The patient expressed understanding and agreed to proceed.   HPI:  Betty Jackson apparently woke up a few days ago with some redness involving dorsum left hand.  They noticed some redness and warmth and mild tenderness.  She has not had any systemic fever and no chills.  Denies any recent injury.  She had home health nurse come out earlier today and they were concerned about her redness and mild swelling of the hand.  She does not have any history of gout  She does have history of C. difficile and is just finishing treatment for that.    ROS: See pertinent positives and negatives per HPI.  Past Medical History:  Diagnosis Date  . Acute respiratory failure with hypoxia ([place]) 05/20/2017  . Anemia, unspecified 10/28/2012  . Anxiety state, unspecified 10/28/2012  . CAD (coronary artery disease)    a. Stent RCA in [facility];  b. Cath approx 2009 - nonobs per pt report.  . Cataract    immature on the left eye  . Chronic insomnia 02/07/2013  . Chronic lower back pain    scoliosis  . CKD (chronic kidney disease) stage 3, GFR 30-59 ml/min   . Complication of anesthesia    pt has a very high tolerance to meds  . CVA (cerebral infarction) 10/29/2012  . DDD (degenerative  disc disease)   . Depression   . Diverticulitis    hx of  . Diverticulosis   . Enteritis   . GERD (gastroesophageal reflux disease) 09/01/2010  . Giant cell arteritis ([place])   . Hemorrhoids   . Herniated nucleus pulposus, L5-S1, right 11/04/2015  . History of scabies   . HTN (hypertension) 05/20/2017  . Hyperlipidemia    takes Lipitor daily  . Hypertension    takes Amlodipine,Losartan,Metoprolol,and HCTZ daily  . Incontinence of urine   . Insomnia    takes Restoril nightly  . Lumbar stenosis 04/25/2013  . Major depressive disorder, recurrent episode, moderate ([place]) 07/16/2013  . Migraines    "back in my 20's; none since" (10/28/2012)  . Osteoporosis   . PAF (paroxysmal atrial fibrillation) ([place]) 2011   a. lone epidode in 2011 according to notes.  . Rheumatoid arthritis ([place])   . Scoliosis   . Sepsis ([place]) 05/2019  . Sinus bradycardia    a. on chronic bb  . Temporal arteritis ([place]) 2011   a. followed @ [facility]; potential flareup 10/28/2012/notes 10/28/2012  . Vocal cord dysfunction    "they don't operate properly" (10/28/2012)    Past Surgical History:  Procedure Laterality Date  . ABDOMINAL HYSTERECTOMY  ~ 1984   vaginally  . BACK SURGERY  7-38yrs ago   X Stop  . BLADDER SUSPENSION  2001  . BREAST BIOPSY Right   . CATARACT EXTRACTION W/ INTRAOCULAR LENS IMPLANT Right ~  08/2012  . CHEST TUBE INSERTION Left 03/08/2019   Procedure: Chest Tube Insertion;  Surgeon: Ivin Poot, MD;  Location: [facility];  Service: Thoracic;  Laterality: Left;  . COLONOSCOPY  01/26/2012   Procedure: COLONOSCOPY;  Surgeon: Ladene Artist, MD,FACG;  Location: [facility] ENDOSCOPY;  Service: Endoscopy;  Laterality: N/A;  note the EGD is possible  . CORONARY ANGIOPLASTY WITH STENT PLACEMENT  2006   X 1 stent  . EPIDURAL BLOCK INJECTION    . ESOPHAGOGASTRODUODENOSCOPY  01/26/2012   Procedure: ESOPHAGOGASTRODUODENOSCOPY (EGD);  Surgeon: Ladene Artist, MD,FACG;  Location: [facility] ENDOSCOPY;  Service: Endoscopy;  Laterality:  N/A;  . HEMIARTHROPLASTY HIP Right 2012  . IR EPIDUROGRAPHY  04/20/2019  . LAPAROSCOPIC CHOLECYSTECTOMY  2001  . LUMBAR FUSION  03/2013  . LUMBAR LAMINECTOMY/DECOMPRESSION MICRODISCECTOMY Right 11/04/2015   Procedure: Right Lumbar Five-Sacral One Microdiskectomy;  Surgeon: Kristeen Miss, MD;  Location: [place] NEURO ORS;  Service: Neurosurgery;  Laterality: Right;  Right L5-S1 Microdiskectomy  . moles removed that required stiches     one on leg and one on face  . SUBXYPHOID PERICARDIAL WINDOW N/A 03/08/2019   Procedure: SUBXYPHOID PERICARDIAL WINDOW;  Surgeon: Ivin Poot, MD;  Location: [place];  Service: Thoracic;  Laterality: N/A;  . TEE WITHOUT CARDIOVERSION N/A 03/08/2019   Procedure: TRANSESOPHAGEAL ECHOCARDIOGRAM (TEE);  Surgeon: Prescott Gum, Collier Salina, MD;  Location: [facility];  Service: Thoracic;  Laterality: N/A;  . TEMPORAL ARTERY BIOPSY / LIGATION Bilateral 2011  . TONSILLECTOMY AND ADENOIDECTOMY     at age 39  . TUBAL LIGATION  ~ 1982  . X-STOP IMPLANTATION  ~ 2010   "lower back" (10/28/2012)    Family History  Problem Relation Age of Onset  . Brain cancer Mother 5  . Heart attack Father   . Breast cancer Daughter 81  . Heart disease Other   . Hypertension Other   . Arthritis Neg Hx   . Colon cancer Neg Hx   . Osteoporosis Neg Hx     SOCIAL HX: Her husband passed away recently and she has very supportive family and seems to be coping fairly well   Current Outpatient Medications:  .  alendronate (FOSAMAX) 70 MG tablet, Take 1 tablet (70 mg total) by mouth every 7 (seven) days. Take with a full glass of water on an empty stomach., Disp: 4 tablet, Rfl: 11 .  cephALEXin (KEFLEX) 500 MG capsule, Take 1 capsule (500 mg total) by mouth 3 (three) times daily., Disp: 21 capsule, Rfl: 0 .  Cholecalciferol (VITAMIN D-3) 25 MCG (1000 UT) CAPS, Take 1,000 Units by mouth daily., Disp: , Rfl:  .  diltiazem (CARDIZEM CD) 240 MG 24 hr capsule, Take 1 capsule (240 mg total) by mouth daily., Disp: 90  capsule, Rfl: 3 .  ELIQUIS 5 MG TABS tablet, TAKE 1 TABLET BY MOUTH TWICE A DAY, Disp: 60 tablet, Rfl: 9 .  escitalopram (LEXAPRO) 10 MG tablet, TAKE 1 TABLET BY MOUTH EVERY DAY, Disp: 90 tablet, Rfl: 3 .  famotidine (PEPCID) 20 MG tablet, Take 1 tablet (20 mg total) by mouth daily., Disp: 30 tablet, Rfl: 1 .  fentaNYL (DURAGESIC) 12 MCG/HR, Place 1 patch onto the skin every 3 (three) days., Disp: 10 patch, Rfl: 0 .  fentaNYL (DURAGESIC) 12 MCG/HR, Apply one patch every 72 hours.  May refill in one month, Disp: 10 patch, Rfl: 0 .  fentaNYL (DURAGESIC) 12 MCG/HR, Apply one patch every 72 hours..   May refill in two months., Disp:  10 patch, Rfl: 0 .  ferrous sulfate 325 (65 FE) MG tablet, Take 1 tablet (325 mg total) by mouth 2 (two) times daily with a meal., Disp: 60 tablet, Rfl: 3 .  furosemide (LASIX) 40 MG tablet, Take 1 tablet (40 mg total) by mouth daily., Disp: 30 tablet, Rfl:  .  HYDROcodone-acetaminophen (NORCO) 10-325 MG tablet, Take one tablet by mouth every 8 hours as needed for breakthrough pain., Disp: 30 tablet, Rfl: 0 .  lactobacillus acidophilus (BACID) TABS tablet, Take 2 tablets by mouth 3 (three) times daily., Disp: 60 tablet, Rfl: 0 .  metoprolol tartrate (LOPRESSOR) 100 MG tablet, Take 1 tablet (100 mg total) by mouth 2 (two) times daily., Disp: 180 tablet, Rfl: 3 .  potassium chloride SA (KLOR-CON) 10 MEQ tablet, Take 1 tablet (10 mEq total) by mouth daily., Disp: 30 tablet, Rfl: 2 .  predniSONE (DELTASONE) 10 MG tablet, Take 1 tablet (10 mg total) by mouth daily., Disp: , Rfl:  .  vancomycin (VANCOCIN) 125 MG capsule, Take one capsule every other day until 5/23 and then take one capsule every 3 days until prescription completed., Disp: 15 capsule, Rfl: 0 .  vitamin C (ASCORBIC ACID) 500 MG tablet, Take 1,000 mg by mouth daily. , Disp: , Rfl:  .  vitamin E (VITAMIN E) 400 UNIT capsule, Take 400 Units by mouth daily., Disp: , Rfl:   EXAM:  VITALS per patient if  applicable:  GENERAL: alert, oriented, appears well and in no acute distress  HEENT: atraumatic, conjunttiva clear, no obvious abnormalities on inspection of external nose and ears  NECK: normal movements of the head and neck  LUNGS: on inspection no signs of respiratory distress, breathing rate appears normal, no obvious gross SOB, gasping or wheezing  CV: no obvious cyanosis  MS: moves all visible extremities without noticeable abnormality  PSYCH/NEURO: pleasant and cooperative, no obvious depression or anxiety, speech and thought processing grossly intact  Left hand was viewed and does appear to be slightly swollen with some moderate erythema diffusely.  This is not seem to extend to the forearm.  ASSESSMENT AND PLAN:  Discussed the following assessment and plan:  Probable acute cellulitis left hand  -Even though she has history of C. difficile we feel that this needs to be addressed in terms of antibiotic therapy as she has had some progressive redness swelling and pain over the past few days.  Not showing signs of systemic illness at this time.  -Elevate hand frequently  -Start Keflex 500 mg 3 times daily for 7 days     I discussed the assessment and treatment plan with the patient. The patient was provided an opportunity to ask questions and all were answered. The patient agreed with the plan and demonstrated an understanding of the instructions.   The patient was advised to call back or seek an in-person evaluation if the symptoms worsen or if the condition fails to improve as anticipated.    Carolann Littler, MD

## 2019-09-01 NOTE — Telephone Encounter (Signed)
Please advise 

## 2019-09-01 NOTE — Telephone Encounter (Signed)
Lvm for julie to call back to set up virtual appt at 3:45

## 2019-09-01 NOTE — Telephone Encounter (Signed)
Almyra Free with [facility] called stating pt left hand is warm to touch and red/swollen. Almyra Free states she always call in to let Dr. Elease Hashimoto knows what is going on first to get advice before doing anything like making appt or going to ER/urgent care.I also suggested she speak with triage nurse but denied and rather talk to Dr. Elease Hashimoto.  Call Almyra Free at [phone]

## 2019-09-01 NOTE — Telephone Encounter (Signed)
Pt has been scheduled for virtual visit spoke with daughter

## 2019-09-01 NOTE — Telephone Encounter (Signed)
I spoke with Betty Jackson.  Is there anyway I could do virtual as add on at end of day?  Difficult to ascertain whether cellulitis by talking to her.  If we could see hand may have a better feel.

## 2019-09-04 ENCOUNTER — Other Ambulatory Visit: Payer: Self-pay

## 2019-09-04 ENCOUNTER — Emergency Department (HOSPITAL_BASED_OUTPATIENT_CLINIC_OR_DEPARTMENT_OTHER): Payer: Medicare Other

## 2019-09-04 ENCOUNTER — Emergency Department (HOSPITAL_BASED_OUTPATIENT_CLINIC_OR_DEPARTMENT_OTHER)
Admission: EM | Admit: 2019-09-04 | Discharge: 2019-09-04 | Disposition: A | Discharge status: Home / Self Care | Payer: Medicare Other | Attending: Emergency Medicine | Admitting: Emergency Medicine

## 2019-09-04 ENCOUNTER — Telehealth: Payer: Self-pay | Admitting: Family Medicine

## 2019-09-04 ENCOUNTER — Encounter (HOSPITAL_BASED_OUTPATIENT_CLINIC_OR_DEPARTMENT_OTHER): Payer: Self-pay | Admitting: Emergency Medicine

## 2019-09-04 DIAGNOSIS — N134 Hydroureter: Secondary | ICD-10-CM | POA: Diagnosis not present

## 2019-09-04 DIAGNOSIS — I509 Heart failure, unspecified: Secondary | ICD-10-CM | POA: Diagnosis not present

## 2019-09-04 DIAGNOSIS — Z888 Allergy status to other drugs, medicaments and biological substances status: Secondary | ICD-10-CM | POA: Diagnosis not present

## 2019-09-04 DIAGNOSIS — N183 Chronic kidney disease, stage 3 unspecified: Secondary | ICD-10-CM | POA: Insufficient documentation

## 2019-09-04 DIAGNOSIS — Z955 Presence of coronary angioplasty implant and graft: Secondary | ICD-10-CM | POA: Diagnosis not present

## 2019-09-04 DIAGNOSIS — Z79899 Other long term (current) drug therapy: Secondary | ICD-10-CM | POA: Diagnosis not present

## 2019-09-04 DIAGNOSIS — Z7901 Long term (current) use of anticoagulants: Secondary | ICD-10-CM | POA: Insufficient documentation

## 2019-09-04 DIAGNOSIS — I130 Hypertensive heart and chronic kidney disease with heart failure and stage 1 through stage 4 chronic kidney disease, or unspecified chronic kidney disease: Secondary | ICD-10-CM | POA: Diagnosis not present

## 2019-09-04 DIAGNOSIS — I480 Paroxysmal atrial fibrillation: Secondary | ICD-10-CM | POA: Diagnosis not present

## 2019-09-04 DIAGNOSIS — K625 Hemorrhage of anus and rectum: Secondary | ICD-10-CM | POA: Diagnosis not present

## 2019-09-04 DIAGNOSIS — K921 Melena: Secondary | ICD-10-CM | POA: Diagnosis present

## 2019-09-04 DIAGNOSIS — I2510 Atherosclerotic heart disease of native coronary artery without angina pectoris: Secondary | ICD-10-CM | POA: Diagnosis not present

## 2019-09-04 LAB — COMPREHENSIVE METABOLIC PANEL
ALT: 31 U/L (ref 0–44)
AST: 22 U/L (ref 15–41)
Albumin: 3.5 g/dL (ref 3.5–5.0)
Alkaline Phosphatase: 137 U/L — ABNORMAL HIGH (ref 38–126)
Anion gap: 12 (ref 5–15)
BUN: 19 mg/dL (ref 8–23)
CO2: 25 mmol/L (ref 22–32)
Calcium: 8.3 mg/dL — ABNORMAL LOW (ref 8.9–10.3)
Chloride: 98 mmol/L (ref 98–111)
Creatinine, Ser: 1.04 mg/dL — ABNORMAL HIGH (ref 0.44–1.00)
GFR calc Af Amer: 58 mL/min — ABNORMAL LOW (ref >60)
GFR calc non Af Amer: 50 mL/min — ABNORMAL LOW (ref >60)
Glucose, Bld: 246 mg/dL — ABNORMAL HIGH (ref 70–99)
Potassium: 3.4 mmol/L — ABNORMAL LOW (ref 3.5–5.1)
Sodium: 135 mmol/L (ref 135–145)
Total Bilirubin: 0.6 mg/dL (ref 0.3–1.2)
Total Protein: 6.7 g/dL (ref 6.5–8.1)

## 2019-09-04 LAB — CBC WITH DIFFERENTIAL/PLATELET
Abs Immature Granulocytes: 0.07 10*3/uL (ref 0.00–0.07)
Basophils Absolute: 0.1 10*3/uL (ref 0.0–0.1)
Basophils Relative: 0 %
Eosinophils Absolute: 0 10*3/uL (ref 0.0–0.5)
Eosinophils Relative: 0 %
HCT: 40.4 % (ref 36.0–46.0)
Hemoglobin: 13 g/dL (ref 12.0–15.0)
Immature Granulocytes: 1 %
Lymphocytes Relative: 7 %
Lymphs Abs: 0.8 10*3/uL (ref 0.7–4.0)
MCH: 29.5 pg (ref 26.0–34.0)
MCHC: 32.2 g/dL (ref 30.0–36.0)
MCV: 91.6 fL (ref 80.0–100.0)
Monocytes Absolute: 0.5 10*3/uL (ref 0.1–1.0)
Monocytes Relative: 4 %
Neutro Abs: 10 10*3/uL — ABNORMAL HIGH (ref 1.7–7.7)
Neutrophils Relative %: 88 %
Platelets: 258 10*3/uL (ref 150–400)
RBC: 4.41 MIL/uL (ref 3.87–5.11)
RDW: 15.1 % (ref 11.5–15.5)
WBC: 11.4 10*3/uL — ABNORMAL HIGH (ref 4.0–10.5)
nRBC: 0 % (ref 0.0–0.2)

## 2019-09-04 MED ORDER — IOHEXOL 300 MG/ML  SOLN
100.0000 mL | Freq: Once | INTRAMUSCULAR | Status: AC | PRN
  Administered 2019-09-04: 100 mL via INTRAVENOUS

## 2019-09-04 NOTE — ED Triage Notes (Signed)
Noticed Blood in Stool Saturday and today one time each.Bright red. No pain. PT denies n/v/d/

## 2019-09-04 NOTE — Discharge Instructions (Addendum)
Continue taking home medications as prescribed. Make sure you are staying well-hydrated with water. If you start to develop constipation or straining with having a bowel movement, make sure you are taking a stool softener and/or MiraLAX to decrease risk of bleeding. Follow-up with your GI doctor for further evaluation of your rectal bleeding. Follow-up with your primary care doctor as needed for further evaluation. Your CT today showed chronic fracture of your hip, follow-up with your primary care doctor to see if you need further imaging for this. Return to the emergency room if you develop high fevers, severe worsening pain, persistent bleeding in between bowel movements, or with any new, worsening, or concerning symptoms.

## 2019-09-04 NOTE — Telephone Encounter (Signed)
Triage nurse called to talk with Dr. Erick Blinks nurse about the sending the patient to the ER. She doesn't want to send her to the ER if she can be seen here because of her age.  Please advise  Precious Bard [phone]

## 2019-09-04 NOTE — ED Provider Notes (Signed)
[facility] EMERGENCY DEPARTMENT Provider Note   CSN: [id number] Arrival date & time: 09/04/19  1507     History Chief Complaint  Patient presents with  . Betty Jackson is a 85 y.o. female presenting for evaluation of Betty in the stool.  Patient states 2 days ago she had a bowel movement with bright red Betty.  She had no further bleeding until today, when she had another bowel movement with bright red Betty.  As such, she was instructed to come to the ER for further evaluation.  She denies abdominal pain.  She denies fevers, chills, nausea, vomiting.  She denies Betty in her urine.  No Betty in between having a bowel movement.  She is on Eliquis, last dose was this morning.  She has had a complex history recently, including multiple hospitalizations in the past several months, including for C. difficile and diverticulitis.  She has had a single appointment with her by her GI, but has not followed up yet.  She is currently finishing an extended taper of neomycin for C. difficile.  She also was recently given doxycycline for cellulitis of the hand.  Additional history obtained from chart review.  Patient with a history of anemia, anxiety, CAD status post stent placement, chronic low back pain, CKD, previous CVA, diverticulosis, GERD, hemorrhoids, hypertension, hyperlipidemia, PAF on anticoagulation, GCA, RA, C. difficile.  HPI     Past Medical History:  Diagnosis Date  . Acute respiratory failure with hypoxia ([place]) 05/20/2017  . Anemia, unspecified 10/28/2012  . Anxiety state, unspecified 10/28/2012  . CAD (coronary artery disease)    a. Stent RCA in [facility];  b. Cath approx 2009 - nonobs per pt report.  . Cataract    immature on the left eye  . Chronic insomnia 02/07/2013  . Chronic lower back pain    scoliosis  . CKD (chronic kidney disease) stage 3, GFR 30-59 ml/min   . Complication of anesthesia    pt has a very high tolerance to meds  . CVA  (cerebral infarction) 10/29/2012  . DDD (degenerative disc disease)   . Depression   . Diverticulitis    hx of  . Diverticulosis   . Enteritis   . GERD (gastroesophageal reflux disease) 09/01/2010  . Giant cell arteritis ([place])   . Hemorrhoids   . Herniated nucleus pulposus, L5-S1, right 11/04/2015  . History of scabies   . HTN (hypertension) 05/20/2017  . Hyperlipidemia    takes Lipitor daily  . Hypertension    takes Amlodipine,Losartan,Metoprolol,and HCTZ daily  . Incontinence of urine   . Insomnia    takes Restoril nightly  . Lumbar stenosis 04/25/2013  . Major depressive disorder, recurrent episode, moderate ([place]) 07/16/2013  . Migraines    "back in my 20's; none since" (10/28/2012)  . Osteoporosis   . PAF (paroxysmal atrial fibrillation) ([place]) 2011   a. lone epidode in 2011 according to notes.  . Rheumatoid arthritis ([place])   . Scoliosis   . Sepsis ([place]) 05/2019  . Sinus bradycardia    a. on chronic bb  . Temporal arteritis ([place]) 2011   a. followed @ [facility]; potential flareup 10/28/2012/notes 10/28/2012  . Vocal cord dysfunction    "they don't operate properly" (10/28/2012)    Patient Active Problem List   Diagnosis Date Noted  . HCAP (healthcare-associated pneumonia) 06/19/2019  . Encephalopathy 06/19/2019  . Lumbar degenerative disc disease   . Paroxysmal A-fib ([place])   .  Hypomagnesemia   . C. difficile colitis   . Diarrhea 05/29/2019  . CHF (congestive heart failure) ([place]) 05/29/2019  . Recurrent falls 04/30/2019  . Pericardial effusion without cardiac tamponade 03/29/2019  . Hypoalbuminemia 03/27/2019  . Cardiac tamponade   . Pericarditis 03/06/2019  . Fever   . Septic shock ([place])   . Atypical chest pain   . Atrial fibrillation with rapid ventricular response ([place])   . Nausea 01/17/2019  . Acute diverticulitis 01/13/2019  . AKI (acute kidney injury) ([place]) 01/13/2019  . Chronic back pain 01/13/2019  . Tachycardia 01/13/2019  . Hypokalemia 01/13/2019  . Chronic low  back pain 07/10/2018  . Influenza A 05/20/2017  . Sepsis ([place]) 05/20/2017  . Acute respiratory failure with hypoxia ([place]) 05/20/2017  . CKD (chronic kidney disease), stage III 05/20/2017  . PAF (paroxysmal atrial fibrillation) ([place]) 05/20/2017  . HTN (hypertension) 05/20/2017  . Giant cell arteritis ([place]) 05/20/2017  . Coronary disease 05/20/2017  . Abnormal urinalysis 05/20/2017  . Osteoporosis 05/08/2016  . Herniated nucleus pulposus, L5-S1, right 11/04/2015  . Major depressive disorder, recurrent episode, moderate ([place]) 07/16/2013  . Abdominal pain, epigastric 06/22/2013  . Lumbar stenosis 04/25/2013  . Chronic insomnia 02/07/2013  . Elevated transaminase level 02/07/2013  . CVA (cerebral infarction) 10/29/2012  . Visual changes 10/28/2012  . Depression 10/28/2012  . Anxiety state, unspecified 10/28/2012  . Anemia, unspecified 10/28/2012  . Nonspecific (abnormal) findings on radiological and other examination of gastrointestinal tract 01/25/2012  . Hyponatremia 01/24/2012  . Hematuria 01/24/2012  . Enteritis 12/24/2011  . Abdominal pain, other specified site 12/24/2011  . Leg pain, right 02/18/2011  . Temporal arteritis ([place]) 09/01/2010  . Hypertension 09/01/2010  . Hyperlipidemia 09/01/2010  . History of atrial fibrillation 09/01/2010  . CAD (coronary artery disease) 09/01/2010  . GERD (gastroesophageal reflux disease) 09/01/2010  . Insomnia 09/01/2010    Past Surgical History:  Procedure Laterality Date  . ABDOMINAL HYSTERECTOMY  ~ 1984   vaginally  . BACK SURGERY  7-94yrs ago   X Stop  . BLADDER SUSPENSION  2001  . BREAST BIOPSY Right   . CATARACT EXTRACTION W/ INTRAOCULAR LENS IMPLANT Right ~ 08/2012  . CHEST TUBE INSERTION Left 03/08/2019   Procedure: Chest Tube Insertion;  Surgeon: Ivin Poot, MD;  Location: [facility];  Service: Thoracic;  Laterality: Left;  . COLONOSCOPY  01/26/2012   Procedure: COLONOSCOPY;  Surgeon: Ladene Artist, MD,FACG;  Location: [facility]  ENDOSCOPY;  Service: Endoscopy;  Laterality: N/A;  note the EGD is possible  . CORONARY ANGIOPLASTY WITH STENT PLACEMENT  2006   X 1 stent  . EPIDURAL BLOCK INJECTION    . ESOPHAGOGASTRODUODENOSCOPY  01/26/2012   Procedure: ESOPHAGOGASTRODUODENOSCOPY (EGD);  Surgeon: Ladene Artist, MD,FACG;  Location: [facility] ENDOSCOPY;  Service: Endoscopy;  Laterality: N/A;  . HEMIARTHROPLASTY HIP Right 2012  . IR EPIDUROGRAPHY  04/20/2019  . LAPAROSCOPIC CHOLECYSTECTOMY  2001  . LUMBAR FUSION  03/2013  . LUMBAR LAMINECTOMY/DECOMPRESSION MICRODISCECTOMY Right 11/04/2015   Procedure: Right Lumbar Five-Sacral One Microdiskectomy;  Surgeon: Kristeen Miss, MD;  Location: [facility] NEURO ORS;  Service: Neurosurgery;  Laterality: Right;  Right L5-S1 Microdiskectomy  . moles removed that required stiches     one on leg and one on face  . SUBXYPHOID PERICARDIAL WINDOW N/A 03/08/2019   Procedure: SUBXYPHOID PERICARDIAL WINDOW;  Surgeon: Ivin Poot, MD;  Location: [facility];  Service: Thoracic;  Laterality: N/A;  . TEE WITHOUT CARDIOVERSION N/A 03/08/2019   Procedure: TRANSESOPHAGEAL ECHOCARDIOGRAM (TEE);  Surgeon: Prescott Gum, Collier Salina, MD;  Location: [facility];  Service: Thoracic;  Laterality: N/A;  . TEMPORAL ARTERY BIOPSY / LIGATION Bilateral 2011  . TONSILLECTOMY AND ADENOIDECTOMY     at age 52  . TUBAL LIGATION  ~ 1982  . X-STOP IMPLANTATION  ~ 2010   "lower back" (10/28/2012)     OB History   No obstetric history on file.     Family History  Problem Relation Age of Onset  . Brain cancer Mother 98  . Heart attack Father   . Breast cancer Daughter 2  . Heart disease Other   . Hypertension Other   . Arthritis Neg Hx   . Colon cancer Neg Hx   . Osteoporosis Neg Hx     Social History   Tobacco Use  . Smoking status: Never Smoker  . Smokeless tobacco: Never Used  Substance Use Topics  . Alcohol use: Yes    Comment: socially  . Drug use: No    Home Medications Prior to Admission medications   Medication Sig  Start Date End Date Taking? Authorizing Provider  alendronate (FOSAMAX) 70 MG tablet Take 1 tablet (70 mg total) by mouth every 7 (seven) days. Take with a full glass of water on an empty stomach. 04/28/19   Burchette, Alinda Sierras, MD  cephALEXin (KEFLEX) 500 MG capsule Take 1 capsule (500 mg total) by mouth 3 (three) times daily. 09/01/19   Burchette, Alinda Sierras, MD  Cholecalciferol (VITAMIN D-3) 25 MCG (1000 UT) CAPS Take 1,000 Units by mouth daily.    [provider]  diltiazem (CARDIZEM CD) 240 MG 24 hr capsule Take 1 capsule (240 mg total) by mouth daily. 07/19/19   Bhagat, Crista Luria, PA  ELIQUIS 5 MG TABS tablet TAKE 1 TABLET BY MOUTH TWICE A DAY 05/15/19   Burnell Blanks, MD  escitalopram (LEXAPRO) 10 MG tablet TAKE 1 TABLET BY MOUTH EVERY DAY 12/14/18   Burchette, Alinda Sierras, MD  famotidine (PEPCID) 20 MG tablet Take 1 tablet (20 mg total) by mouth daily. 06/02/19   Eugenie Filler, MD  fentaNYL (DURAGESIC) 12 MCG/HR Place 1 patch onto the skin every 3 (three) days. 07/19/19   Burchette, Alinda Sierras, MD  fentaNYL (DURAGESIC) 12 MCG/HR Apply one patch every 72 hours.  May refill in one month 07/19/19   Burchette, Alinda Sierras, MD  fentaNYL (DURAGESIC) 12 MCG/HR Apply one patch every 72 hours..   May refill in two months. 07/19/19   Burchette, Alinda Sierras, MD  ferrous sulfate 325 (65 FE) MG tablet Take 1 tablet (325 mg total) by mouth 2 (two) times daily with a meal. 04/21/19   Oswald Hillock, MD  furosemide (LASIX) 40 MG tablet Take 1 tablet (40 mg total) by mouth daily. 06/03/19   Eugenie Filler, MD  HYDROcodone-acetaminophen [facility]) 10-325 MG tablet Take one tablet by mouth every 8 hours as needed for breakthrough pain. 08/09/19   Burchette, Alinda Sierras, MD  lactobacillus acidophilus (BACID) TABS tablet Take 2 tablets by mouth 3 (three) times daily. 06/01/19   Eugenie Filler, MD  metoprolol tartrate (LOPRESSOR) 100 MG tablet Take 1 tablet (100 mg total) by mouth 2 (two) times daily. 07/24/19   Burnell Blanks, MD  potassium chloride SA (KLOR-CON) 10 MEQ tablet Take 1 tablet (10 mEq total) by mouth daily. 04/21/19   Oswald Hillock, MD  predniSONE (DELTASONE) 10 MG tablet Take 1 tablet (10 mg total) by mouth daily. 04/21/19   Eleonore Chiquito  S, MD  vancomycin (VANCOCIN) 125 MG capsule Take one capsule every other day until 5/23 and then take one capsule every 3 days until prescription completed. 08/14/19   Burchette, Alinda Sierras, MD  vitamin C (ASCORBIC ACID) 500 MG tablet Take 1,000 mg by mouth daily.     [provider]  vitamin E (VITAMIN E) 400 UNIT capsule Take 400 Units by mouth daily.    [provider]    Allergies    Dextromethorphan  Review of Systems   Review of Systems  Gastrointestinal: Positive for Betty in stool.  Hematological: Bruises/bleeds easily.  All other systems reviewed and are negative.   Physical Exam Updated Vital Signs BP (!) 135/57   Pulse 61   Temp 98.5 F (36.9 C) (Oral)   Resp 14   Ht 5\' 4"  (1.626 m)   Wt 67.1 kg   SpO2 96%   BMI 25.40 kg/m   Physical Exam Vitals and nursing note reviewed. Exam conducted with a chaperone present.  Constitutional:      General: She is not in acute distress.    Appearance: She is well-developed.     Comments: Elderly female resting comfortably in the bed in no acute distress  HENT:     Head: Normocephalic and atraumatic.  Eyes:     Extraocular Movements: Extraocular movements intact.     Conjunctiva/sclera: Conjunctivae normal.     Pupils: Pupils are equal, round, and reactive to light.  Cardiovascular:     Rate and Rhythm: Normal rate and regular rhythm.     Pulses: Normal pulses.  Pulmonary:     Effort: Pulmonary effort is normal. No respiratory distress.     Breath sounds: Normal breath sounds. No wheezing.  Abdominal:     General: There is no distension.     Palpations: Abdomen is soft. There is no mass.     Tenderness: There is no abdominal tenderness. There is no guarding or rebound.      Comments: No ttp of the abd. No rigidity, guarding, distention. Negative rebound  Genitourinary:    Rectum: No mass, tenderness, anal fissure, external hemorrhoid or internal hemorrhoid.     Comments: No gross Betty noted on exam. No obvious hemorrhoids. Musculoskeletal:        General: Normal range of motion.     Cervical back: Normal range of motion and neck supple.  Skin:    General: Skin is warm and dry.  Neurological:     Mental Status: She is alert and oriented to person, place, and time.     ED Results / Procedures / Treatments   Labs (all labs ordered are listed, but only abnormal results are displayed) Labs Reviewed  CBC WITH DIFFERENTIAL/PLATELET - Abnormal; Notable for the following components:      Result Value   WBC 11.4 (*)    Neutro Abs 10.0 (*)    All other components within normal limits  COMPREHENSIVE METABOLIC PANEL - Abnormal; Notable for the following components:   Potassium 3.4 (*)    Glucose, Bld 246 (*)    Creatinine, Ser 1.04 (*)    Calcium 8.3 (*)    Alkaline Phosphatase 137 (*)    GFR calc non Af Amer 50 (*)    GFR calc Af Amer 58 (*)    All other components within normal limits    EKG None  Radiology CT ABDOMEN PELVIS W CONTRAST  Result Date: 09/04/2019 CLINICAL DATA:  Suspected diverticulitis. EXAM: CT ABDOMEN AND PELVIS  WITH CONTRAST TECHNIQUE: Multidetector CT imaging of the abdomen and pelvis was performed using the standard protocol following bolus administration of intravenous contrast. CONTRAST:  120mL OMNIPAQUE IOHEXOL 300 MG/ML  SOLN COMPARISON:  05/28/2019 FINDINGS: Lower chest: Resolution of pleural thickening and or pleural fluid seen on the previous exam. Basilar scarring or atelectasis at the LEFT lung base. No consolidation or signs of pleural effusion. Hepatobiliary: Post cholecystectomy. No suspicious focal hepatic lesion. Stable mild intra and extrahepatic biliary ductal distension. Pancreas: Pancreas is normal without  suspicious focal lesion or ductal dilation. No signs of pancreatic inflammation. Spleen: Spleen normal size without focal lesion. Adrenals/Urinary Tract: Adrenal glands are normal. Symmetric renal enhancement without evidence of hydronephrosis. Small low-density lesions in the LEFT kidney are stable. Likely small cysts. Similarly small low-density lesions in the RIGHT kidney without change Stomach/Bowel: Colonic diverticulosis signs of diverticular disease and colonic thickening. Question minimal pericolonic stranding best seen on image 60 of series 8 this is quite subtle and is not associated with pericolonic abscess or obstruction. The appendix is normal. No acute small bowel process. Vascular/Lymphatic: Calcified atheromatous plaque throughout the abdominal aorta, nonaneurysmal. No adenopathy in the retroperitoneum or in the upper abdomen. No pelvic lymphadenopathy. Streak artifact from RIGHT hip arthroplasty limits assessment of the pelvis. Reproductive: Post hysterectomy. No adnexal mass. Small fat containing umbilical hernia. Other: As above. Musculoskeletal: Heterogeneity of the bilateral sacral ala. Post RIGHT hip arthroplasty. Chronic periosteal reaction about the LEFT ischium as before with new site of fracture along the LEFT inferior pubic ramus, now also associated with periosteal reaction. This is new compared to previous imaging. Signs of cement augmentation in vertebral bodies and of spinal fusion. Cement augmentation at T11 and L1 with fusion of L2 through L4 with associated laminectomy similar to prior exams. IMPRESSION: 1. Colonic diverticulosis signs of diverticular disease and colonic thickening. Query mild associated diverticulitis. 2. 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 3. Signs of sacral potential insufficiency fractures with heterogeneity of the bilateral  sacral ala. 4. Aortic atherosclerosis. Aortic Atherosclerosis (ICD10-I70.0). Electronically Signed   By: Zetta Bills M.D.   On: 09/04/2019 17:50    Procedures Procedures (including critical care time)  Medications Ordered in ED Medications  iohexol (OMNIPAQUE) 300 MG/ML solution 100 mL (100 mLs Intravenous Contrast Given 09/04/19 1703)    ED Course  I have reviewed the triage vital signs and the nursing notes.  Pertinent labs & imaging results that were available during my care of the patient were reviewed by me and considered in my medical decision making (see chart for details).    MDM Rules/Calculators/A&P                      Patient presenting for evaluation of bright red Betty in her stool.  On exam, patient appears nontoxic.  She has no abdominal tenderness, nausea, vomiting, or fevers.  As such, low suspicion for intra-abdominal infection.  She does not have active bleeding on exam.  However patient is on Eliquis.  Will check labs to assess for hemoglobin.  She has had recent complicated history including C. difficile, diverticulitis, as well as abnormal findings on a pelvic x-ray.  Considering her age and plan less bright red bleeding, will obtain CT to ensure no obvious mass or concerning intra-abdominal bleed.  Labs interpreted by me, overall reassuring.  Mild leukocytosis of 11.4, nonspecific.  Hemoglobin stable at 13.  Creatinine mildly elevated from baseline at 1.  Electrolytes are stable.  CT abdomen pelvis without emergent findings.  Does show diverticulosis with signs of possible early diverticulitis.  However as patient is afebrile, without pain, and currently on vancomycin, I do not like she needs further antibiotics.  Will have patient continue to monitor.  Consider diverticular bleed as cause for bleeding.  No obvious mass or tumor.  CT does show abnormality of the pelvis with acute on chronic fractures.  Discussed findings with patient and daughter, and encouraged  follow-up with primary care.  Case discussed with attending, Dr. Karle Starch agrees to plan.  At this time, patient appears safe for discharge.  Return precautions given.  Patient and daughter state they understand and agree to plan.  Final Clinical Impression(s) / ED Diagnoses Final diagnoses:  BRBPR (bright red Betty per rectum)    Rx / DC Orders ED Discharge Orders    None       Franchot Heidelberg, PA-C 09/04/19 Vern[facility] Honey, MD 09/04/19 2121

## 2019-09-04 NOTE — Telephone Encounter (Signed)
Pt is being sent to ED for blood in stool

## 2019-09-04 NOTE — ED Notes (Signed)
ED Provider at bedside. chaperoned for rectal

## 2019-09-05 ENCOUNTER — Encounter: Payer: Self-pay | Admitting: Family Medicine

## 2019-09-05 DIAGNOSIS — R531 Weakness: Secondary | ICD-10-CM

## 2019-09-05 DIAGNOSIS — G8929 Other chronic pain: Secondary | ICD-10-CM

## 2019-09-06 MED ORDER — HYDROCODONE-ACETAMINOPHEN 10-325 MG PO TABS: ORAL_TABLET | ORAL | 0 refills | Status: DC

## 2019-09-06 NOTE — Telephone Encounter (Signed)
Refill of hydrocodone sent  Reviewed CT.  I believe MRI of pelvis can be discontinued.  Looks like fracture in area of pelvis questioned.  I will set up home health referral again- with someone other than [facility], since they could not accommodate her before.

## 2019-09-11 NOTE — Telephone Encounter (Signed)
Melissa Vogelsinger is the patients daughter, she called wanting to know why the patient hasn't been scheduled for PT home health.  Please advise

## 2019-09-12 ENCOUNTER — Telehealth: Payer: Self-pay | Admitting: Family Medicine

## 2019-09-13 ENCOUNTER — Telehealth: Payer: Self-pay | Admitting: Family Medicine

## 2019-09-13 DIAGNOSIS — R531 Weakness: Secondary | ICD-10-CM

## 2019-09-13 NOTE — Telephone Encounter (Signed)
Order has been placed.

## 2019-09-13 NOTE — Telephone Encounter (Signed)
Per  [facility] back they  can see this pt today  Need new order for skill nursing and PT asap  They have agreed to see the patient this Friday or Saturday

## 2019-09-13 NOTE — Telephone Encounter (Signed)
Please advise 

## 2019-09-13 NOTE — Telephone Encounter (Signed)
Clarification on the message I sent you on  [facility] called back to our office and agreed to see this patient. This Friday and sat  Betty Jackson Female, 85 y.o., 01-13-1935 [phone] MRN:  [id number] They made an exception. They need skilled nursing on the referral and PT. Could you disregard the message about pt wanting to go to [facility]

## 2019-09-13 NOTE — Telephone Encounter (Signed)
I will order AGAIN.  I think this will be the fourth time I have ordered home PT on this patient.  Hopefully, someone will actually see her this time.

## 2019-09-13 NOTE — Telephone Encounter (Signed)
Pt daughter [name]  would like to know if the pt could have a referral for Physical therapy at [facility] . Because she can not get her mother into Home health  due to staffing issues  [phone]

## 2019-09-13 NOTE — Telephone Encounter (Signed)
No longer needed right now she other note from 09/13/19

## 2019-09-14 ENCOUNTER — Telehealth: Payer: Self-pay | Admitting: Family Medicine

## 2019-09-14 DIAGNOSIS — K5792 Diverticulitis of intestine, part unspecified, without perforation or abscess without bleeding: Secondary | ICD-10-CM | POA: Diagnosis not present

## 2019-09-14 DIAGNOSIS — M316 Other giant cell arteritis: Secondary | ICD-10-CM | POA: Diagnosis not present

## 2019-09-14 DIAGNOSIS — M069 Rheumatoid arthritis, unspecified: Secondary | ICD-10-CM | POA: Diagnosis not present

## 2019-09-14 DIAGNOSIS — K5790 Diverticulosis of intestine, part unspecified, without perforation or abscess without bleeding: Secondary | ICD-10-CM | POA: Diagnosis not present

## 2019-09-14 DIAGNOSIS — I5032 Chronic diastolic (congestive) heart failure: Secondary | ICD-10-CM | POA: Diagnosis not present

## 2019-09-14 DIAGNOSIS — I130 Hypertensive heart and chronic kidney disease with heart failure and stage 1 through stage 4 chronic kidney disease, or unspecified chronic kidney disease: Secondary | ICD-10-CM | POA: Diagnosis not present

## 2019-09-14 DIAGNOSIS — D631 Anemia in chronic kidney disease: Secondary | ICD-10-CM | POA: Diagnosis not present

## 2019-09-14 DIAGNOSIS — I480 Paroxysmal atrial fibrillation: Secondary | ICD-10-CM | POA: Diagnosis not present

## 2019-09-14 DIAGNOSIS — N183 Chronic kidney disease, stage 3 unspecified: Secondary | ICD-10-CM | POA: Diagnosis not present

## 2019-09-14 DIAGNOSIS — M810 Age-related osteoporosis without current pathological fracture: Secondary | ICD-10-CM | POA: Diagnosis not present

## 2019-09-14 DIAGNOSIS — I2510 Atherosclerotic heart disease of native coronary artery without angina pectoris: Secondary | ICD-10-CM | POA: Diagnosis not present

## 2019-09-14 NOTE — Telephone Encounter (Signed)
Sharyn Lull from [facility] need a verbal order for this pt.

## 2019-09-15 NOTE — Telephone Encounter (Signed)
Left detailed message giving verbal orders for PT

## 2019-09-18 ENCOUNTER — Telehealth: Payer: Self-pay | Admitting: Family Medicine

## 2019-09-18 DIAGNOSIS — K5790 Diverticulosis of intestine, part unspecified, without perforation or abscess without bleeding: Secondary | ICD-10-CM | POA: Diagnosis not present

## 2019-09-18 DIAGNOSIS — K5792 Diverticulitis of intestine, part unspecified, without perforation or abscess without bleeding: Secondary | ICD-10-CM | POA: Diagnosis not present

## 2019-09-18 DIAGNOSIS — M069 Rheumatoid arthritis, unspecified: Secondary | ICD-10-CM | POA: Diagnosis not present

## 2019-09-18 DIAGNOSIS — D631 Anemia in chronic kidney disease: Secondary | ICD-10-CM | POA: Diagnosis not present

## 2019-09-18 DIAGNOSIS — M810 Age-related osteoporosis without current pathological fracture: Secondary | ICD-10-CM | POA: Diagnosis not present

## 2019-09-18 DIAGNOSIS — M316 Other giant cell arteritis: Secondary | ICD-10-CM | POA: Diagnosis not present

## 2019-09-18 DIAGNOSIS — I480 Paroxysmal atrial fibrillation: Secondary | ICD-10-CM | POA: Diagnosis not present

## 2019-09-18 DIAGNOSIS — I2510 Atherosclerotic heart disease of native coronary artery without angina pectoris: Secondary | ICD-10-CM | POA: Diagnosis not present

## 2019-09-18 DIAGNOSIS — N183 Chronic kidney disease, stage 3 unspecified: Secondary | ICD-10-CM | POA: Diagnosis not present

## 2019-09-18 DIAGNOSIS — I5032 Chronic diastolic (congestive) heart failure: Secondary | ICD-10-CM | POA: Diagnosis not present

## 2019-09-18 DIAGNOSIS — I130 Hypertensive heart and chronic kidney disease with heart failure and stage 1 through stage 4 chronic kidney disease, or unspecified chronic kidney disease: Secondary | ICD-10-CM | POA: Diagnosis not present

## 2019-09-18 NOTE — Telephone Encounter (Signed)
[place] with [facility] is requesting a verbal order for physical therapy. The frequency is twice a week for 3 weeks increase strength, endurance, balance and independence with mobility.  [facility]'s # [phone]

## 2019-09-18 NOTE — Telephone Encounter (Signed)
Verbal orders given  

## 2019-09-19 DIAGNOSIS — I130 Hypertensive heart and chronic kidney disease with heart failure and stage 1 through stage 4 chronic kidney disease, or unspecified chronic kidney disease: Secondary | ICD-10-CM | POA: Diagnosis not present

## 2019-09-19 DIAGNOSIS — D631 Anemia in chronic kidney disease: Secondary | ICD-10-CM | POA: Diagnosis not present

## 2019-09-19 DIAGNOSIS — M069 Rheumatoid arthritis, unspecified: Secondary | ICD-10-CM | POA: Diagnosis not present

## 2019-09-19 DIAGNOSIS — I480 Paroxysmal atrial fibrillation: Secondary | ICD-10-CM | POA: Diagnosis not present

## 2019-09-19 DIAGNOSIS — M316 Other giant cell arteritis: Secondary | ICD-10-CM | POA: Diagnosis not present

## 2019-09-19 DIAGNOSIS — I5032 Chronic diastolic (congestive) heart failure: Secondary | ICD-10-CM | POA: Diagnosis not present

## 2019-09-19 DIAGNOSIS — N183 Chronic kidney disease, stage 3 unspecified: Secondary | ICD-10-CM | POA: Diagnosis not present

## 2019-09-19 DIAGNOSIS — K5790 Diverticulosis of intestine, part unspecified, without perforation or abscess without bleeding: Secondary | ICD-10-CM | POA: Diagnosis not present

## 2019-09-19 DIAGNOSIS — I2510 Atherosclerotic heart disease of native coronary artery without angina pectoris: Secondary | ICD-10-CM | POA: Diagnosis not present

## 2019-09-19 DIAGNOSIS — K5792 Diverticulitis of intestine, part unspecified, without perforation or abscess without bleeding: Secondary | ICD-10-CM | POA: Diagnosis not present

## 2019-09-19 DIAGNOSIS — M810 Age-related osteoporosis without current pathological fracture: Secondary | ICD-10-CM | POA: Diagnosis not present

## 2019-09-20 DIAGNOSIS — I5032 Chronic diastolic (congestive) heart failure: Secondary | ICD-10-CM | POA: Diagnosis not present

## 2019-09-20 DIAGNOSIS — M316 Other giant cell arteritis: Secondary | ICD-10-CM | POA: Diagnosis not present

## 2019-09-20 DIAGNOSIS — I130 Hypertensive heart and chronic kidney disease with heart failure and stage 1 through stage 4 chronic kidney disease, or unspecified chronic kidney disease: Secondary | ICD-10-CM | POA: Diagnosis not present

## 2019-09-20 DIAGNOSIS — I2510 Atherosclerotic heart disease of native coronary artery without angina pectoris: Secondary | ICD-10-CM | POA: Diagnosis not present

## 2019-09-20 DIAGNOSIS — I480 Paroxysmal atrial fibrillation: Secondary | ICD-10-CM | POA: Diagnosis not present

## 2019-09-20 DIAGNOSIS — K5790 Diverticulosis of intestine, part unspecified, without perforation or abscess without bleeding: Secondary | ICD-10-CM | POA: Diagnosis not present

## 2019-09-20 DIAGNOSIS — N183 Chronic kidney disease, stage 3 unspecified: Secondary | ICD-10-CM | POA: Diagnosis not present

## 2019-09-20 DIAGNOSIS — M069 Rheumatoid arthritis, unspecified: Secondary | ICD-10-CM | POA: Diagnosis not present

## 2019-09-20 DIAGNOSIS — M810 Age-related osteoporosis without current pathological fracture: Secondary | ICD-10-CM | POA: Diagnosis not present

## 2019-09-20 DIAGNOSIS — D631 Anemia in chronic kidney disease: Secondary | ICD-10-CM | POA: Diagnosis not present

## 2019-09-20 DIAGNOSIS — K5792 Diverticulitis of intestine, part unspecified, without perforation or abscess without bleeding: Secondary | ICD-10-CM | POA: Diagnosis not present

## 2019-09-25 ENCOUNTER — Emergency Department (HOSPITAL_BASED_OUTPATIENT_CLINIC_OR_DEPARTMENT_OTHER): Payer: Medicare Other

## 2019-09-25 ENCOUNTER — Encounter (HOSPITAL_BASED_OUTPATIENT_CLINIC_OR_DEPARTMENT_OTHER): Payer: Self-pay | Admitting: *Deleted

## 2019-09-25 ENCOUNTER — Inpatient Hospital Stay (HOSPITAL_BASED_OUTPATIENT_CLINIC_OR_DEPARTMENT_OTHER)
Admission: EM | Admit: 2019-09-25 | Discharge: 2019-09-29 | DRG: 481 | Disposition: A | Discharge status: Skilled Nursing Facility | Payer: Medicare Other | Attending: Internal Medicine | Admitting: Internal Medicine

## 2019-09-25 ENCOUNTER — Other Ambulatory Visit: Payer: Medicare Other

## 2019-09-25 ENCOUNTER — Other Ambulatory Visit: Payer: Self-pay

## 2019-09-25 DIAGNOSIS — I480 Paroxysmal atrial fibrillation: Secondary | ICD-10-CM

## 2019-09-25 DIAGNOSIS — E782 Mixed hyperlipidemia: Secondary | ICD-10-CM | POA: Diagnosis not present

## 2019-09-25 DIAGNOSIS — R6520 Severe sepsis without septic shock: Secondary | ICD-10-CM | POA: Diagnosis not present

## 2019-09-25 DIAGNOSIS — F29 Unspecified psychosis not due to a substance or known physiological condition: Secondary | ICD-10-CM | POA: Diagnosis present

## 2019-09-25 DIAGNOSIS — W1830XA Fall on same level, unspecified, initial encounter: Secondary | ICD-10-CM | POA: Diagnosis present

## 2019-09-25 DIAGNOSIS — Z01810 Encounter for preprocedural cardiovascular examination: Secondary | ICD-10-CM | POA: Diagnosis not present

## 2019-09-25 DIAGNOSIS — R296 Repeated falls: Secondary | ICD-10-CM | POA: Diagnosis not present

## 2019-09-25 DIAGNOSIS — M069 Rheumatoid arthritis, unspecified: Secondary | ICD-10-CM | POA: Diagnosis present

## 2019-09-25 DIAGNOSIS — I5032 Chronic diastolic (congestive) heart failure: Secondary | ICD-10-CM | POA: Diagnosis present

## 2019-09-25 DIAGNOSIS — D62 Acute posthemorrhagic anemia: Secondary | ICD-10-CM | POA: Diagnosis not present

## 2019-09-25 DIAGNOSIS — G8929 Other chronic pain: Secondary | ICD-10-CM | POA: Diagnosis present

## 2019-09-25 DIAGNOSIS — M25561 Pain in right knee: Secondary | ICD-10-CM | POA: Diagnosis not present

## 2019-09-25 DIAGNOSIS — S92901A Unspecified fracture of right foot, initial encounter for closed fracture: Secondary | ICD-10-CM | POA: Diagnosis not present

## 2019-09-25 DIAGNOSIS — Z7401 Bed confinement status: Secondary | ICD-10-CM | POA: Diagnosis not present

## 2019-09-25 DIAGNOSIS — Z8719 Personal history of other diseases of the digestive system: Secondary | ICD-10-CM

## 2019-09-25 DIAGNOSIS — M810 Age-related osteoporosis without current pathological fracture: Secondary | ICD-10-CM | POA: Diagnosis not present

## 2019-09-25 DIAGNOSIS — Z515 Encounter for palliative care: Secondary | ICD-10-CM | POA: Diagnosis not present

## 2019-09-25 DIAGNOSIS — S72491D Other fracture of lower end of right femur, subsequent encounter for closed fracture with routine healing: Secondary | ICD-10-CM | POA: Diagnosis not present

## 2019-09-25 DIAGNOSIS — M48061 Spinal stenosis, lumbar region without neurogenic claudication: Secondary | ICD-10-CM | POA: Diagnosis not present

## 2019-09-25 DIAGNOSIS — M316 Other giant cell arteritis: Secondary | ICD-10-CM | POA: Diagnosis present

## 2019-09-25 DIAGNOSIS — J189 Pneumonia, unspecified organism: Secondary | ICD-10-CM | POA: Diagnosis not present

## 2019-09-25 DIAGNOSIS — Z7901 Long term (current) use of anticoagulants: Secondary | ICD-10-CM | POA: Diagnosis not present

## 2019-09-25 DIAGNOSIS — S299XXA Unspecified injury of thorax, initial encounter: Secondary | ICD-10-CM | POA: Diagnosis not present

## 2019-09-25 DIAGNOSIS — F411 Generalized anxiety disorder: Secondary | ICD-10-CM

## 2019-09-25 DIAGNOSIS — Z7952 Long term (current) use of systemic steroids: Secondary | ICD-10-CM

## 2019-09-25 DIAGNOSIS — Z419 Encounter for procedure for purposes other than remedying health state, unspecified: Secondary | ICD-10-CM

## 2019-09-25 DIAGNOSIS — N183 Chronic kidney disease, stage 3 unspecified: Secondary | ICD-10-CM | POA: Diagnosis present

## 2019-09-25 DIAGNOSIS — Z79891 Long term (current) use of opiate analgesic: Secondary | ICD-10-CM

## 2019-09-25 DIAGNOSIS — I440 Atrioventricular block, first degree: Secondary | ICD-10-CM | POA: Diagnosis present

## 2019-09-25 DIAGNOSIS — S72431A Displaced fracture of medial condyle of right femur, initial encounter for closed fracture: Secondary | ICD-10-CM | POA: Diagnosis not present

## 2019-09-25 DIAGNOSIS — W19XXXA Unspecified fall, initial encounter: Secondary | ICD-10-CM | POA: Diagnosis not present

## 2019-09-25 DIAGNOSIS — S72491A Other fracture of lower end of right femur, initial encounter for closed fracture: Secondary | ICD-10-CM | POA: Diagnosis not present

## 2019-09-25 DIAGNOSIS — Z9049 Acquired absence of other specified parts of digestive tract: Secondary | ICD-10-CM

## 2019-09-25 DIAGNOSIS — I10 Essential (primary) hypertension: Secondary | ICD-10-CM | POA: Diagnosis not present

## 2019-09-25 DIAGNOSIS — E876 Hypokalemia: Secondary | ICD-10-CM

## 2019-09-25 DIAGNOSIS — I499 Cardiac arrhythmia, unspecified: Secondary | ICD-10-CM | POA: Diagnosis not present

## 2019-09-25 DIAGNOSIS — K219 Gastro-esophageal reflux disease without esophagitis: Secondary | ICD-10-CM | POA: Diagnosis not present

## 2019-09-25 DIAGNOSIS — S72401A Unspecified fracture of lower end of right femur, initial encounter for closed fracture: Secondary | ICD-10-CM | POA: Diagnosis not present

## 2019-09-25 DIAGNOSIS — Z7189 Other specified counseling: Secondary | ICD-10-CM | POA: Diagnosis not present

## 2019-09-25 DIAGNOSIS — Z79899 Other long term (current) drug therapy: Secondary | ICD-10-CM

## 2019-09-25 DIAGNOSIS — M79671 Pain in right foot: Secondary | ICD-10-CM | POA: Diagnosis not present

## 2019-09-25 DIAGNOSIS — Z8673 Personal history of transient ischemic attack (TIA), and cerebral infarction without residual deficits: Secondary | ICD-10-CM

## 2019-09-25 DIAGNOSIS — Z66 Do not resuscitate: Secondary | ICD-10-CM | POA: Diagnosis present

## 2019-09-25 DIAGNOSIS — D72828 Other elevated white blood cell count: Secondary | ICD-10-CM | POA: Diagnosis present

## 2019-09-25 DIAGNOSIS — M549 Dorsalgia, unspecified: Secondary | ICD-10-CM | POA: Diagnosis not present

## 2019-09-25 DIAGNOSIS — E785 Hyperlipidemia, unspecified: Secondary | ICD-10-CM | POA: Diagnosis not present

## 2019-09-25 DIAGNOSIS — M25551 Pain in right hip: Secondary | ICD-10-CM | POA: Diagnosis not present

## 2019-09-25 DIAGNOSIS — I509 Heart failure, unspecified: Secondary | ICD-10-CM | POA: Diagnosis not present

## 2019-09-25 DIAGNOSIS — Z955 Presence of coronary angioplasty implant and graft: Secondary | ICD-10-CM

## 2019-09-25 DIAGNOSIS — I130 Hypertensive heart and chronic kidney disease with heart failure and stage 1 through stage 4 chronic kidney disease, or unspecified chronic kidney disease: Secondary | ICD-10-CM | POA: Diagnosis not present

## 2019-09-25 DIAGNOSIS — Z8679 Personal history of other diseases of the circulatory system: Secondary | ICD-10-CM

## 2019-09-25 DIAGNOSIS — T148XXA Other injury of unspecified body region, initial encounter: Secondary | ICD-10-CM

## 2019-09-25 DIAGNOSIS — S7291XA Unspecified fracture of right femur, initial encounter for closed fracture: Secondary | ICD-10-CM | POA: Diagnosis present

## 2019-09-25 DIAGNOSIS — G9340 Encephalopathy, unspecified: Secondary | ICD-10-CM | POA: Diagnosis not present

## 2019-09-25 DIAGNOSIS — S79911A Unspecified injury of right hip, initial encounter: Secondary | ICD-10-CM | POA: Diagnosis not present

## 2019-09-25 DIAGNOSIS — Z20822 Contact with and (suspected) exposure to covid-19: Secondary | ICD-10-CM | POA: Diagnosis present

## 2019-09-25 DIAGNOSIS — M2550 Pain in unspecified joint: Secondary | ICD-10-CM | POA: Diagnosis not present

## 2019-09-25 DIAGNOSIS — I2510 Atherosclerotic heart disease of native coronary artery without angina pectoris: Secondary | ICD-10-CM | POA: Diagnosis not present

## 2019-09-25 DIAGNOSIS — G4700 Insomnia, unspecified: Secondary | ICD-10-CM | POA: Diagnosis present

## 2019-09-25 DIAGNOSIS — Z743 Need for continuous supervision: Secondary | ICD-10-CM | POA: Diagnosis not present

## 2019-09-25 DIAGNOSIS — M5127 Other intervertebral disc displacement, lumbosacral region: Secondary | ICD-10-CM | POA: Diagnosis not present

## 2019-09-25 DIAGNOSIS — Z888 Allergy status to other drugs, medicaments and biological substances status: Secondary | ICD-10-CM

## 2019-09-25 DIAGNOSIS — Z803 Family history of malignant neoplasm of breast: Secondary | ICD-10-CM

## 2019-09-25 DIAGNOSIS — Z7983 Long term (current) use of bisphosphonates: Secondary | ICD-10-CM

## 2019-09-25 DIAGNOSIS — N1831 Chronic kidney disease, stage 3a: Secondary | ICD-10-CM

## 2019-09-25 DIAGNOSIS — F331 Major depressive disorder, recurrent, moderate: Secondary | ICD-10-CM | POA: Diagnosis present

## 2019-09-25 DIAGNOSIS — R531 Weakness: Secondary | ICD-10-CM | POA: Diagnosis not present

## 2019-09-25 DIAGNOSIS — S72401D Unspecified fracture of lower end of right femur, subsequent encounter for closed fracture with routine healing: Secondary | ICD-10-CM | POA: Diagnosis not present

## 2019-09-25 DIAGNOSIS — K5900 Constipation, unspecified: Secondary | ICD-10-CM | POA: Diagnosis not present

## 2019-09-25 DIAGNOSIS — S72402A Unspecified fracture of lower end of left femur, initial encounter for closed fracture: Secondary | ICD-10-CM | POA: Diagnosis not present

## 2019-09-25 DIAGNOSIS — Z03818 Encounter for observation for suspected exposure to other biological agents ruled out: Secondary | ICD-10-CM | POA: Diagnosis not present

## 2019-09-25 DIAGNOSIS — R410 Disorientation, unspecified: Secondary | ICD-10-CM | POA: Diagnosis not present

## 2019-09-25 DIAGNOSIS — Z808 Family history of malignant neoplasm of other organs or systems: Secondary | ICD-10-CM

## 2019-09-25 DIAGNOSIS — W19XXXD Unspecified fall, subsequent encounter: Secondary | ICD-10-CM | POA: Diagnosis not present

## 2019-09-25 DIAGNOSIS — D631 Anemia in chronic kidney disease: Secondary | ICD-10-CM | POA: Diagnosis not present

## 2019-09-25 DIAGNOSIS — S3991XA Unspecified injury of abdomen, initial encounter: Secondary | ICD-10-CM | POA: Diagnosis not present

## 2019-09-25 DIAGNOSIS — E559 Vitamin D deficiency, unspecified: Secondary | ICD-10-CM | POA: Diagnosis not present

## 2019-09-25 DIAGNOSIS — Z90710 Acquired absence of both cervix and uterus: Secondary | ICD-10-CM

## 2019-09-25 DIAGNOSIS — Z8701 Personal history of pneumonia (recurrent): Secondary | ICD-10-CM

## 2019-09-25 DIAGNOSIS — Z8249 Family history of ischemic heart disease and other diseases of the circulatory system: Secondary | ICD-10-CM

## 2019-09-25 DIAGNOSIS — A419 Sepsis, unspecified organism: Secondary | ICD-10-CM | POA: Diagnosis not present

## 2019-09-25 LAB — CBC WITH DIFFERENTIAL/PLATELET
Abs Immature Granulocytes: 0.22 10*3/uL — ABNORMAL HIGH (ref 0.00–0.07)
Basophils Absolute: 0.1 10*3/uL (ref 0.0–0.1)
Basophils Relative: 0 %
Eosinophils Absolute: 0 10*3/uL (ref 0.0–0.5)
Eosinophils Relative: 0 %
HCT: 41.1 % (ref 36.0–46.0)
Hemoglobin: 12.9 g/dL (ref 12.0–15.0)
Immature Granulocytes: 1 %
Lymphocytes Relative: 6 %
Lymphs Abs: 1.3 10*3/uL (ref 0.7–4.0)
MCH: 29.3 pg (ref 26.0–34.0)
MCHC: 31.4 g/dL (ref 30.0–36.0)
MCV: 93.2 fL (ref 80.0–100.0)
Monocytes Absolute: 1.5 10*3/uL — ABNORMAL HIGH (ref 0.1–1.0)
Monocytes Relative: 7 %
Neutro Abs: 17.8 10*3/uL — ABNORMAL HIGH (ref 1.7–7.7)
Neutrophils Relative %: 86 %
Platelets: 244 10*3/uL (ref 150–400)
RBC: 4.41 MIL/uL (ref 3.87–5.11)
RDW: 14.7 % (ref 11.5–15.5)
WBC: 20.9 10*3/uL — ABNORMAL HIGH (ref 4.0–10.5)
nRBC: 0 % (ref 0.0–0.2)

## 2019-09-25 LAB — SARS CORONAVIRUS 2 BY RT PCR (HOSPITAL ORDER, PERFORMED IN ~~LOC~~ HOSPITAL LAB): SARS Coronavirus 2: NEGATIVE

## 2019-09-25 LAB — BASIC METABOLIC PANEL
Anion gap: 14 (ref 5–15)
BUN: 26 mg/dL — ABNORMAL HIGH (ref 8–23)
CO2: 31 mmol/L (ref 22–32)
Calcium: 8.7 mg/dL — ABNORMAL LOW (ref 8.9–10.3)
Chloride: 96 mmol/L — ABNORMAL LOW (ref 98–111)
Creatinine, Ser: 1.01 mg/dL — ABNORMAL HIGH (ref 0.44–1.00)
GFR calc Af Amer: 60 mL/min — ABNORMAL LOW (ref >60)
GFR calc non Af Amer: 51 mL/min — ABNORMAL LOW (ref >60)
Glucose, Bld: 97 mg/dL (ref 70–99)
Potassium: 3.4 mmol/L — ABNORMAL LOW (ref 3.5–5.1)
Sodium: 141 mmol/L (ref 135–145)

## 2019-09-25 LAB — URINALYSIS, ROUTINE W REFLEX MICROSCOPIC
Bilirubin Urine: NEGATIVE
Glucose, UA: NEGATIVE mg/dL
Ketones, ur: NEGATIVE mg/dL
Leukocytes,Ua: NEGATIVE
Nitrite: NEGATIVE
Protein, ur: NEGATIVE mg/dL
Specific Gravity, Urine: 1.015 (ref 1.005–1.030)
pH: 7 (ref 5.0–8.0)

## 2019-09-25 LAB — PROTIME-INR
INR: 1.2 (ref 0.8–1.2)
Prothrombin Time: 15.1 seconds (ref 11.4–15.2)

## 2019-09-25 LAB — TYPE AND SCREEN
ABO/RH(D): O POS
Antibody Screen: NEGATIVE

## 2019-09-25 LAB — URINALYSIS, MICROSCOPIC (REFLEX)

## 2019-09-25 LAB — GLUCOSE, CAPILLARY: Glucose-Capillary: 94 mg/dL (ref 70–99)

## 2019-09-25 MED ORDER — IOHEXOL 300 MG/ML  SOLN
100.0000 mL | Freq: Once | INTRAMUSCULAR | Status: AC | PRN
  Administered 2019-09-25: 100 mL via INTRAVENOUS

## 2019-09-25 MED ORDER — MORPHINE SULFATE (PF) 4 MG/ML IV SOLN
4.0000 mg | Freq: Once | INTRAVENOUS | Status: AC
  Administered 2019-09-25: 4 mg via INTRAVENOUS
  Filled 2019-09-25: qty 1

## 2019-09-25 MED ORDER — ESCITALOPRAM OXALATE 10 MG PO TABS
10.0000 mg | ORAL_TABLET | Freq: Every day | ORAL | Status: DC
  Administered 2019-09-27 – 2019-09-29 (×3): 10 mg via ORAL
  Filled 2019-09-25 (×4): qty 1

## 2019-09-25 MED ORDER — FENTANYL 12 MCG/HR TD PT72
1.0000 | MEDICATED_PATCH | TRANSDERMAL | Status: DC
  Administered 2019-09-25 – 2019-09-28 (×2): 1 via TRANSDERMAL
  Filled 2019-09-25 (×2): qty 1

## 2019-09-25 MED ORDER — METOPROLOL TARTRATE 50 MG PO TABS
100.0000 mg | ORAL_TABLET | Freq: Two times a day (BID) | ORAL | Status: DC
  Administered 2019-09-25 – 2019-09-29 (×8): 100 mg via ORAL
  Filled 2019-09-25 (×9): qty 2

## 2019-09-25 MED ORDER — POTASSIUM CHLORIDE CRYS ER 20 MEQ PO TBCR
40.0000 meq | EXTENDED_RELEASE_TABLET | Freq: Once | ORAL | Status: AC
  Administered 2019-09-25: 40 meq via ORAL
  Filled 2019-09-25: qty 2

## 2019-09-25 MED ORDER — ALPRAZOLAM 0.5 MG PO TABS
0.5000 mg | ORAL_TABLET | Freq: Every evening | ORAL | Status: DC | PRN
  Administered 2019-09-25 – 2019-09-28 (×3): 0.5 mg via ORAL
  Filled 2019-09-25 (×3): qty 1

## 2019-09-25 MED ORDER — HYDROMORPHONE HCL 1 MG/ML IJ SOLN
0.5000 mg | INTRAMUSCULAR | Status: DC | PRN
  Administered 2019-09-26 – 2019-09-27 (×4): 0.5 mg via INTRAVENOUS
  Filled 2019-09-25 (×4): qty 1

## 2019-09-25 MED ORDER — HYDROCODONE-ACETAMINOPHEN 5-325 MG PO TABS
1.0000 | ORAL_TABLET | Freq: Four times a day (QID) | ORAL | Status: DC | PRN
  Administered 2019-09-25: 1 via ORAL
  Administered 2019-09-26 – 2019-09-29 (×10): 2 via ORAL
  Filled 2019-09-25 (×4): qty 2
  Filled 2019-09-25: qty 1
  Filled 2019-09-25 (×6): qty 2

## 2019-09-25 MED ORDER — DILTIAZEM HCL ER COATED BEADS 120 MG PO CP24
240.0000 mg | ORAL_CAPSULE | Freq: Every day | ORAL | Status: DC
  Administered 2019-09-25 – 2019-09-29 (×5): 240 mg via ORAL
  Filled 2019-09-25 (×5): qty 2

## 2019-09-25 MED ORDER — METHOCARBAMOL 1000 MG/10ML IJ SOLN
500.0000 mg | Freq: Four times a day (QID) | INTRAVENOUS | Status: DC | PRN
  Filled 2019-09-25: qty 5

## 2019-09-25 MED ORDER — METHYLPREDNISOLONE SODIUM SUCC 40 MG IJ SOLR
10.0000 mg | Freq: Once | INTRAMUSCULAR | Status: DC
  Filled 2019-09-25: qty 0.25

## 2019-09-25 MED ORDER — FENTANYL 12 MCG/HR TD PT72: 1.0000 | MEDICATED_PATCH | TRANSDERMAL | Status: DC

## 2019-09-25 MED ORDER — PREDNISOLONE 5 MG PO TABS
20.0000 mg | ORAL_TABLET | Freq: Every day | ORAL | Status: DC
  Filled 2019-09-25: qty 4

## 2019-09-25 MED ORDER — SODIUM CHLORIDE 0.9 % IV SOLN: INTRAVENOUS | Status: AC

## 2019-09-25 MED ORDER — METHOCARBAMOL 500 MG PO TABS
500.0000 mg | ORAL_TABLET | Freq: Four times a day (QID) | ORAL | Status: DC | PRN
  Administered 2019-09-26 – 2019-09-29 (×4): 500 mg via ORAL
  Filled 2019-09-25 (×4): qty 1

## 2019-09-25 NOTE — ED Provider Notes (Signed)
3:12 PM Care assumed from Dr. Verline Lema.  At time of transfer care, patient has already been admitted for further work-up and management of a femur fracture from a fall however, she was complaining some abdominal pain which to her felt like prior diverticulitis.  She will get a CT abdomen pelvis to rule out diverticulitis however we have a low suspicion for this.  Anticipate follow-up on this result with continued admission plan.  Patient was admitted and left emergency department for the CT returned.  Clinical Impression: 1. Closed fracture of distal end of right femur, unspecified fracture morphology, initial encounter [facility])     Disposition: Admit  This note was prepared with assistance of Dragon voice recognition software. Occasional wrong-word or sound-a-like substitutions may have occurred due to the inherent limitations of voice recognition software.     Hashim Eichhorst, Gwenyth Allegra, MD 09/26/19 [phone]

## 2019-09-25 NOTE — ED Notes (Signed)
Patient transported to CT 

## 2019-09-25 NOTE — H&P (Signed)
[name] MGQ:[id number] DOB: 08/19/1934 DOA: 09/25/2019   PCP: Eulas Post, MD   Outpatient Specialists:   CARDS: Dr. Para Skeans  GI  Dr. Fuller Plan (Lb )    Patient arrived to ER on 09/25/19 at 1107 Referred by Attending Pahwani, Michell Heinrich, MD   Patient coming from: home Lives   With family    Chief Complaint:   Chief Complaint  Patient presents with  . Fall    HPI: Betty Jackson is a 85 y.o. female with medical history significant of complex history recently, including multiple hospitalizations in the past several months, including for C. difficile and diverticulitis, GI bleed, hand cellulitis, history of anemia, anxiety, CAD status post stent placement, chronic low back pain, CKD, previous CVA, diverticulosis, GERD, hemorrhoids, hypertension, hyperlipidemia, PAF on anticoagulation, GCA, RA,    Presented with   mechanical fall that occurred yesterday when patient fell was going to bathroom she fell on her right knee noted some swelling. Pain radiated to her right hip. She apparently was trying to get off the toilet that her knee gave out and she fell down. She did not hit her head Patient has chronic pain and uses fentanyl patch.  She is on Eliquis for history of atrial fibrillation  She has had a bit of abdominal pain today CT of abd in ER unremarkable  Pt has Temporal Arteritis on prednisone 10 mg a day Her Jaw was Popping few days ago now on prednisone taper on 40 mg plan to take 30 Mg for now  last dose of Eliquis was 9 AM She has hard time ambulating at baseline She has been using her Right leg mostly Her left leg is weaker just started on Physical Therapy  Left leg weakness for several months. On a good day she is unable to walk a flight stairs or a block can only walk 30 ft with a walker.  Family denies any CP or shortness of breath she does have anxeity Recently lost her Husband  Patient has severe psychosis during admission    Infectious risk factors:   Reports none  Has  been vaccinated against COVID in January   Initial COVID TEST  NEGATIVE   Lab Results  Component Value Date   [place] 09/25/2019   [place] NEGATIVE 06/27/2019   [place] NEGATIVE 06/20/2019   [place] NEGATIVE 05/28/2019    Regarding pertinent Chronic problems:     Hyperlipidemia -  Not on statins Lipid Panel     Component Value Date/Time   CHOL 171 11/14/2018 1420   CHOL 155 08/18/2017 1241   TRIG 168.0 (H) 11/14/2018 1420   HDL 63.40 11/14/2018 1420   HDL 78 08/18/2017 1241   CHOLHDL 3 11/14/2018 1420   VLDL 33.6 11/14/2018 1420   LDLCALC 74 11/14/2018 1420   LDLCALC 51 08/18/2017 1241   LABVLDL 26 08/18/2017 1241   Temporal Arteritis - on prednisone   HTN on metoprolol, deltiazem   chronic CHF diastolic - last echo Jan 2671 preserved EF and Grade2 diastolic CHF On lasix    CAD  - On   betablocker not on Aspirin                 - followed by cardiology                - last cardiac cath 2009      A. Fib -   CHA2DS2/VAS Stroke Risk Points  7  current  on anticoagulation with   Eliquis,           -  Rate control:  Currently controlled with  Toprolol,   Diltiazem,     CKD stage III - baseline Cr 1.0 Estimated Creatinine Clearance: 40 mL/min (A) (by C-G formula based on SCr of 1.01 mg/dL (H)).  Lab Results  Component Value Date   CREATININE 1.01 (H) 09/25/2019   CREATININE 1.04 (H) 09/04/2019   CREATININE 0.71 06/27/2019     While in ER: Right femur fracture with minimal displacement Initially seen blood pressure  191/80 felt to be due to pain also elevated white blood cell count up to 20  CT abd unremarkable  ER Provider Called:  Orthopedics    Dr.Olin Trauma surgery Dr. Marcelino Scot They Recommend admit to medicine    Will see in AM   Hospitalist was called for admission for right femoral fracture  The following Work up has been ordered so far:  Orders Placed This Encounter  Procedures  . SARS  Coronavirus 2 by RT PCR (hospital order, performed in [facility] hospital lab) Nasopharyngeal Nasopharyngeal Swab  . DG Knee Complete 4 Views Right  . DG Hip Unilat W or Wo Pelvis 2-3 Views Right  . CT Knee Right Wo Contrast  . DG Chest Port 1 View  . CT ABDOMEN PELVIS W CONTRAST  . Basic metabolic panel  . CBC with Differential  . Protime-INR  . Urinalysis, Routine w reflex microscopic  . Urinalysis, Microscopic (reflex)  . Diet NPO time specified  . Apply knee immobilizer  . Cardiac monitoring  . Consult to orthopedic surgery  ALL PATIENTS BEING ADMITTED/HAVING PROCEDURES NEED COVID-19 SCREENING  . Consult to hospitalist  ALL PATIENTS BEING ADMITTED/HAVING PROCEDURES NEED COVID-19 SCREENING  . ED EKG  . EKG 12-Lead  . Admit to Inpatient (patient's expected length of stay will be greater than 2 midnights or inpatient only procedure)     Following Medications were ordered in ER: Medications  morphine 4 MG/ML injection 4 mg (4 mg Intravenous Given 09/25/19 1531)  iohexol (OMNIPAQUE) 300 MG/ML solution 100 mL (100 mLs Intravenous Contrast Given 09/25/19 1706)  morphine 4 MG/ML injection 4 mg (4 mg Intravenous Given 09/25/19 1833)        Consult Orders  (From admission, onward)         Start     Ordered   09/25/19 1436  Consult to hospitalist  ALL PATIENTS BEING ADMITTED/HAVING PROCEDURES NEED COVID-19 SCREENING Called [facility] spoke with Ruby  Once       Comments: ALL PATIENTS BEING ADMITTED/HAVING PROCEDURES NEED COVID-19 SCREENING  Provider:  (Not yet assigned)  Question Answer Comment  Place call to: [facility]   Reason for Consult Admit      09/25/19 1435           Significant initial  Findings: Abnormal Labs Reviewed  BASIC METABOLIC PANEL - Abnormal; Notable for the following components:      Result Value   Potassium 3.4 (*)    Chloride 96 (*)    BUN 26 (*)    Creatinine, Ser 1.01 (*)    Calcium 8.7 (*)    GFR calc non Af Amer 51 (*)    GFR  calc Af Amer 60 (*)    All other components within normal limits  CBC WITH DIFFERENTIAL/PLATELET - Abnormal; Notable for the following components:   WBC 20.9 (*)    Neutro Abs 17.8 (*)  Monocytes Absolute 1.5 (*)    Abs Immature Granulocytes 0.22 (*)    All other components within normal limits  URINALYSIS, ROUTINE W REFLEX MICROSCOPIC - Abnormal; Notable for the following components:   Color, Urine STRAW (*)    Hgb urine dipstick MODERATE (*)    All other components within normal limits  URINALYSIS, MICROSCOPIC (REFLEX) - Abnormal; Notable for the following components:   Bacteria, UA RARE (*)    All other components within normal limits  CBC - Abnormal; Notable for the following components:   WBC 14.1 (*)    Hemoglobin 11.9 (*)    All other components within normal limits     Otherwise labs showing:    Recent Labs  Lab 09/25/19 1419  NA 141  K 3.4*  CO2 31  GLUCOSE 97  BUN 26*  CREATININE 1.01*  CALCIUM 8.7*    Cr    Stable,  Lab Results  Component Value Date   CREATININE 1.01 (H) 09/25/2019   CREATININE 1.04 (H) 09/04/2019   CREATININE 0.71 06/27/2019    No results for input(s): AST, ALT, ALKPHOS, BILITOT, PROT, ALBUMIN in the last 168 hours. Lab Results  Component Value Date   CALCIUM 8.7 (L) 09/25/2019   PHOS 4.2 04/16/2019     WBC      Component Value Date/Time   WBC 20.9 (H) 09/25/2019 1419   ANC    Component Value Date/Time   NEUTROABS 17.8 (H) 09/25/2019 1419   ALC No components found for: LYMPHAB    Plt: Lab Results  Component Value Date   PLT 244 09/25/2019     Lactic Acid, Venous    Component Value Date/Time   LATICACIDVEN 1.9 06/20/2019 0106     COVID-19 Labs  No results for input(s): DDIMER, FERRITIN, LDH, CRP in the last 72 hours.  Lab Results  Component Value Date   SARSCOV2NAA NEGATIVE 09/25/2019   SARSCOV2NAA NEGATIVE 06/27/2019   [place] NEGATIVE 06/20/2019   [place] NEGATIVE 05/28/2019      HG/HCT  stable,      Component Value Date/Time   HGB 12.9 09/25/2019 1419   HCT 41.1 09/25/2019 1419    No results for input(s): LIPASE, AMYLASE in the last 168 hours. No results for input(s): AMMONIA in the last 168 hours.   ECG: Ordered Personally reviewed by me showing: HR : 56 Rhythm: bradycardia  no evidence of ischemic changes QTC 466   BNP (last 3 results) Recent Labs    05/30/19 0404 05/31/19 0455 06/01/19 0322  BNP 286.1* 506.7* 507.5*    DM  labs:  HbA1C: Recent Labs    03/07/19 0419  HGBA1C 5.9*       CBG (last 3)  Recent Labs    09/25/19 2128  GLUCAP 94       UA   no evidence of UTI  hematuria   Urine analysis:    Component Value Date/Time   COLORURINE STRAW (A) 09/25/2019 1636   APPEARANCEUR CLEAR 09/25/2019 1636   LABSPEC 1.015 09/25/2019 1636   PHURINE 7.0 09/25/2019 1636   GLUCOSEU NEGATIVE 09/25/2019 1636   GLUCOSEU NEGATIVE 07/31/2016 1018   HGBUR MODERATE (A) 09/25/2019 1636   BILIRUBINUR NEGATIVE 09/25/2019 1636   BILIRUBINUR neg 07/31/2016 1012   KETONESUR NEGATIVE 09/25/2019 1636   PROTEINUR NEGATIVE 09/25/2019 1636   UROBILINOGEN 0.2 07/31/2016 1018   UROBILINOGEN 0.2 07/31/2016 1012   NITRITE NEGATIVE 09/25/2019 1636   LEUKOCYTESUR NEGATIVE 09/25/2019 1636      Ordered  CXR -  NON acute  CTabd/pelvis -   nonacute   CT right knee Oblique fracture through the medial aspect of the distal right femoral metaphysis extending into the intercondylar notch.   ED Triage Vitals  Enc Vitals Group     BP 09/25/19 1116 (!) 191/80     Pulse Rate 09/25/19 1116 (!) 55     Resp 09/25/19 1116 20     Temp 09/25/19 1116 98.2 F (36.8 C)     Temp Source 09/25/19 1116 Oral     SpO2 09/25/19 1116 96 %     Weight 09/25/19 1113 150 lb (68 kg)     Height 09/25/19 1113 5\' 4"  (1.626 m)     Head Circumference --      Peak Flow --      Pain Score 09/25/19 1113 10     Pain Loc --      Pain Edu? --      Excl. in [place]? --   TMAX(24)@        Latest  Blood pressure (!) 176/82, pulse 65, temperature 99.5 F (37.5 C), temperature source Oral, resp. rate 16, height 5\' 4"  (1.626 m), weight 68 kg, SpO2 93 %.    Review of Systems:    Pertinent positives include:  fatigue, abdominal pain,  Constitutional:  No weight loss, night sweats, Fevers, chills,  weight loss  HEENT:  No headaches, Difficulty swallowing,Tooth/dental problems,Sore throat,  No sneezing, itching, ear ache, nasal congestion, post nasal drip,  Cardio-vascular:  No chest pain, Orthopnea, PND, anasarca, dizziness, palpitations.no Bilateral lower extremity swelling  GI:  No heartburn, indigestion, nausea, vomiting, diarrhea, change in bowel habits, loss of appetite, melena, blood in stool, hematemesis Resp:  no shortness of breath at rest. No dyspnea on exertion, No excess mucus, no productive cough, No non-productive cough, No coughing up of blood.No change in color of mucus.No wheezing. Skin:  no rash or lesions. No jaundice GU:  no dysuria, change in color of urine, no urgency or frequency. No straining to urinate.  No flank pain.  Musculoskeletal:  No joint pain or no joint swelling. No decreased range of motion. No back pain.  Psych:  No change in mood or affect. No depression or anxiety. No memory loss.  Neuro: no localizing neurological complaints, no tingling, no weakness, no double vision, no gait abnormality, no slurred speech, no confusion  All systems reviewed and apart from [place] all are negative  Past Medical History:   Past Medical History:  Diagnosis Date  . Acute respiratory failure with hypoxia ([place]) 05/20/2017  . Anemia, unspecified 10/28/2012  . Anxiety state, unspecified 10/28/2012  . CAD (coronary artery disease)    a. Stent RCA in [facility];  b. Cath approx 2009 - nonobs per pt report.  . Cataract    immature on the left eye  . Chronic insomnia 02/07/2013  . Chronic lower back pain    scoliosis  . CKD (chronic kidney  disease) stage 3, GFR 30-59 ml/min   . Complication of anesthesia    pt has a very high tolerance to meds  . CVA (cerebral infarction) 10/29/2012  . DDD (degenerative disc disease)   . Depression   . Diverticulitis    hx of  . Diverticulosis   . Enteritis   . GERD (gastroesophageal reflux disease) 09/01/2010  . Giant cell arteritis ([place])   . Hemorrhoids   . Herniated nucleus pulposus, L5-S1, right 11/04/2015  . History of scabies   .  HTN (hypertension) 05/20/2017  . Hyperlipidemia    takes Lipitor daily  . Hypertension    takes Amlodipine,Losartan,Metoprolol,and HCTZ daily  . Incontinence of urine   . Insomnia    takes Restoril nightly  . Lumbar stenosis 04/25/2013  . Major depressive disorder, recurrent episode, moderate ([place]) 07/16/2013  . Migraines    "back in my 20's; none since" (10/28/2012)  . Osteoporosis   . PAF (paroxysmal atrial fibrillation) ([place]) 2011   a. lone epidode in 2011 according to notes.  . Rheumatoid arthritis ([place])   . Scoliosis   . Sepsis ([place]) 05/2019  . Sinus bradycardia    a. on chronic bb  . Temporal arteritis ([place]) 2011   a. followed @ [facility]; potential flareup 10/28/2012/notes 10/28/2012  . Vocal cord dysfunction    "they don't operate properly" (10/28/2012)       Past Surgical History:  Procedure Laterality Date  . ABDOMINAL HYSTERECTOMY  ~ 1984   vaginally  . BACK SURGERY  7-41yrs ago   X Stop  . BLADDER SUSPENSION  2001  . BREAST BIOPSY Right   . CATARACT EXTRACTION W/ INTRAOCULAR LENS IMPLANT Right ~ 08/2012  . CHEST TUBE INSERTION Left 03/08/2019   Procedure: Chest Tube Insertion;  Surgeon: Ivin Poot, MD;  Location: [place];  Service: Thoracic;  Laterality: Left;  . COLONOSCOPY  01/26/2012   Procedure: COLONOSCOPY;  Surgeon: Ladene Artist, MD,FACG;  Location: [facility] ENDOSCOPY;  Service: Endoscopy;  Laterality: N/A;  note the EGD is possible  . CORONARY ANGIOPLASTY WITH STENT PLACEMENT  2006   X 1 stent  . EPIDURAL BLOCK INJECTION    .  ESOPHAGOGASTRODUODENOSCOPY  01/26/2012   Procedure: ESOPHAGOGASTRODUODENOSCOPY (EGD);  Surgeon: Ladene Artist, MD,FACG;  Location[facility] ENDOSCOPY;  Service: Endoscopy;  Laterality: N/A;  . HEMIARTHROPLASTY HIP Right 2012  . IR EPIDUROGRAPHY  04/20/2019  . LAPAROSCOPIC CHOLECYSTECTOMY  2001  . LUMBAR FUSION  03/2013  . LUMBAR LAMINECTOMY/DECOMPRESSION MICRODISCECTOMY Right 11/04/2015   Procedure: Right Lumbar Five-Sacral One Microdiskectomy;  Surgeon: Kristeen Miss, MD;  Location: [place] NEURO ORS;  Service: Neurosurgery;  Laterality: Right;  Right L5-S1 Microdiskectomy  . moles removed that required stiches     one on leg and one on face  . SUBXYPHOID PERICARDIAL WINDOW N/A 03/08/2019   Procedure: SUBXYPHOID PERICARDIAL WINDOW;  Surgeon: Ivin Poot, MD;  Location: [place];  Service: Thoracic;  Laterality: N/A;  . TEE WITHOUT CARDIOVERSION N/A 03/08/2019   Procedure: TRANSESOPHAGEAL ECHOCARDIOGRAM (TEE);  Surgeon: Prescott Gum, Collier Salina, MD;  Location: [place];  Service: Thoracic;  Laterality: N/A;  . TEMPORAL ARTERY BIOPSY / LIGATION Bilateral 2011  . TONSILLECTOMY AND ADENOIDECTOMY     at age 44  . TUBAL LIGATION  ~ 1982  . X-STOP IMPLANTATION  ~ 2010   "lower back" (10/28/2012)    Social History:  Ambulatory  walker     reports that she has never smoked. She has never used smokeless tobacco. She reports current alcohol use. She reports that she does not use drugs.   Family History:   Family History  Problem Relation Age of Onset  . Brain cancer Mother 57  . Heart attack Father   . Breast cancer Daughter 28  . Heart disease Other   . Hypertension Other   . Arthritis Neg Hx   . Colon cancer Neg Hx   . Osteoporosis Neg Hx     Allergies: Allergies  Allergen Reactions  . Dextromethorphan Rash     Prior to  Admission medications   Medication Sig Start Date End Date Taking? Authorizing Provider  alendronate (FOSAMAX) 70 MG tablet Take 1 tablet (70 mg total) by mouth every 7 (seven) days.  Take with a full glass of water on an empty stomach. 04/28/19   Burchette, Alinda Sierras, MD  cephALEXin (KEFLEX) 500 MG capsule Take 1 capsule (500 mg total) by mouth 3 (three) times daily. 09/01/19   Burchette, Alinda Sierras, MD  Cholecalciferol (VITAMIN D-3) 25 MCG (1000 UT) CAPS Take 1,000 Units by mouth daily.    [provider]  diltiazem (CARDIZEM CD) 240 MG 24 hr capsule Take 1 capsule (240 mg total) by mouth daily. 07/19/19   Bhagat, Crista Luria, PA  ELIQUIS 5 MG TABS tablet TAKE 1 TABLET BY MOUTH TWICE A DAY 05/15/19   Burnell Blanks, MD  escitalopram (LEXAPRO) 10 MG tablet TAKE 1 TABLET BY MOUTH EVERY DAY 12/14/18   Burchette, Alinda Sierras, MD  famotidine (PEPCID) 20 MG tablet Take 1 tablet (20 mg total) by mouth daily. 06/02/19   Eugenie Filler, MD  fentaNYL (DURAGESIC) 12 MCG/HR Place 1 patch onto the skin every 3 (three) days. 07/19/19   Burchette, Alinda Sierras, MD  fentaNYL (DURAGESIC) 12 MCG/HR Apply one patch every 72 hours.  May refill in one month 07/19/19   Burchette, Alinda Sierras, MD  fentaNYL (DURAGESIC) 12 MCG/HR Apply one patch every 72 hours..   May refill in two months. 07/19/19   Burchette, Alinda Sierras, MD  ferrous sulfate 325 (65 FE) MG tablet Take 1 tablet (325 mg total) by mouth 2 (two) times daily with a meal. 04/21/19   Oswald Hillock, MD  furosemide (LASIX) 40 MG tablet Take 1 tablet (40 mg total) by mouth daily. 06/03/19   Eugenie Filler, MD  HYDROcodone-acetaminophen [facility]) 10-325 MG tablet Take one tablet by mouth every 8 hours as needed for breakthrough pain. 09/06/19   Burchette, Alinda Sierras, MD  lactobacillus acidophilus (BACID) TABS tablet Take 2 tablets by mouth 3 (three) times daily. 06/01/19   Eugenie Filler, MD  metoprolol tartrate (LOPRESSOR) 100 MG tablet Take 1 tablet (100 mg total) by mouth 2 (two) times daily. 07/24/19   Burnell Blanks, MD  potassium chloride SA (KLOR-CON) 10 MEQ tablet Take 1 tablet (10 mEq total) by mouth daily. 04/21/19   Oswald Hillock, MD    predniSONE (DELTASONE) 10 MG tablet Take 1 tablet (10 mg total) by mouth daily. 04/21/19   Oswald Hillock, MD  vancomycin (VANCOCIN) 125 MG capsule Take one capsule every other day until 5/23 and then take one capsule every 3 days until prescription completed. 08/14/19   Burchette, Alinda Sierras, MD  vitamin C (ASCORBIC ACID) 500 MG tablet Take 1,000 mg by mouth daily.     [provider]  vitamin E (VITAMIN E) 400 UNIT capsule Take 400 Units by mouth daily.    [provider]   Physical Exam: Vitals with BMI 09/25/2019 09/25/2019 09/25/2019  Height - - -  Weight - - -  BMI - - -  Systolic 762 831 -  Diastolic 82 62 -  Pulse 65 60 60     1. General:  in No  Acute distress    Chronically ill  -appearing 2. Psychological: Alert and  Oriented to self  3. Head/ENT:     Dry Mucous Membranes  Head Non traumatic, neck supple                           Poor Dentition 4. SKIN:  decreased Skin turgor,  Skin clean Dry and intact no rash 5. Heart: Regular rate and rhythm no Murmur, no Rub or gallop 6. Lungs: no wheezes or crackles   7. Abdomen: Soft,   non-tender, Non distended obese  bowel sounds present 8. Lower extremities: no clubbing, cyanosis, no  Edema Right knee in knee immobolizer 9. Neurologically Grossly intact, moving all 4 extremities equally  10. MSK: Normal range of motion   All other LABS:     Recent Labs  Lab 09/25/19 1419  WBC 20.9*  NEUTROABS 17.8*  HGB 12.9  HCT 41.1  MCV 93.2  PLT 244     Recent Labs  Lab 09/25/19 1419  NA 141  K 3.4*  CL 96*  CO2 31  GLUCOSE 97  BUN 26*  CREATININE 1.01*  CALCIUM 8.7*     No results for input(s): AST, ALT, ALKPHOS, BILITOT, PROT, ALBUMIN in the last 168 hours.     Cultures:    Component Value Date/Time   SDES  06/19/2019 1726    BLOOD LEFT ANTECUBITAL Performed at [facility], [place] [address] [place], [place] [address]    SPECREQUEST  06/19/2019 1726     BOTTLES DRAWN AEROBIC ONLY Blood Culture results may not be optimal due to an inadequate volume of blood received in culture bottles Performed at [facility], [place] [address], [place],  [address]    CULT  06/19/2019 1726    NO GROWTH 5 DAYS Performed at [facility], [place] [address] [place],  [address]    REPTSTATUS 06/24/2019 FINAL 06/19/2019 1726     Radiological Exams on Admission: CT Knee Right Wo Contrast  Result Date: 09/25/2019 CLINICAL DATA:  Right knee pain and swelling since a fall yesterday. Distal femur fracture. EXAM: CT OF THE RIGHT KNEE WITHOUT CONTRAST TECHNIQUE: Multidetector CT imaging of the right knee was performed according to the standard protocol. Multiplanar CT image reconstructions were also generated. COMPARISON:  Radiographs dated 09/25/2019 FINDINGS: Bones/Joint/Cartilage There is an oblique fracture through the medial aspect of the distal right femoral metaphysis extending into the intercondylar notch. On the axial and coronal images there appears to be a sagittal fracture involving the periphery of the posterior aspect of femoral condyle but this is not definitive. No other fractures. Hemarthrosis. Tricompartmental osteoarthritis. Ganglion cyst extending along the popliteus tendon containing a small calcified loose body. Ligaments Posterior cruciate ligament and collateral ligaments are intact. Anterior cruciate ligament is not well enough seen for assessment. Muscles and Tendons Negative. Soft tissues Subcutaneous edema or hemorrhage around the knee to the expected degree. No definable hematoma. IMPRESSION: 1. Oblique fracture through the medial aspect of the distal right femoral metaphysis extending into the intercondylar notch. 2. Possible sagittal fracture involving the periphery of the posterior aspect of the femoral condyle but this is not definitive. 3. Hemarthrosis. 4. Tricompartmental osteoarthritis. 5. Ganglion cyst extending  along the popliteus tendon containing a small calcified loose body. Electronically Signed   By: [name] M.D.   On: 09/25/2019 14:39   DG Chest Port 1 View  Result Date: 09/25/2019 CLINICAL DATA:  Fall with femur fracture. EXAM: PORTABLE CHEST 1 VIEW COMPARISON:  06/19/2019 FINDINGS: Chronic cardiomegaly with left ventricular prominence. Chronic aortic atherosclerosis. Chronic pulmonary scarring  left more than right. No sign of active infiltrate, mass, effusion or collapse. No traumatic finding of an acute nature in the region. Old augmented lower thoracic fracture. IMPRESSION: No active cardiopulmonary disease. Chronic pulmonary scarring left worse than right. Electronically Signed   By: Nelson Chimes M.D.   On: 09/25/2019 15:18   DG Knee Complete 4 Views Right  Result Date: 09/25/2019 CLINICAL DATA:  Fall, pain EXAM: RIGHT KNEE - COMPLETE 4+ VIEW COMPARISON:  None. FINDINGS: Alignment is anatomic. There is an acute fracture of the distal metaphysis of the femur involving the medial cortex. Minor displacement. There are changes of osteoarthritis with joint space narrowing and marginal osteophytes, greatest medially. Probable joint effusion. Vascular calcifications noted. IMPRESSION: Acute fracture of the medial cortex of the distal metaphysis of the right femur with minimal displacement. Probable joint effusion. Osteoarthritis. Electronically Signed   By: Macy Mis M.D.   On: 09/25/2019 12:27   DG Hip Unilat W or Wo Pelvis 2-3 Views Right  Result Date: 09/25/2019 CLINICAL DATA:  Pain following recent fall EXAM: DG HIP (WITH OR WITHOUT PELVIS) 2-3V RIGHT COMPARISON:  Aug 11, 2019. FINDINGS: Frontal pelvis as well as frontal and lateral right hip images were obtained. There is a total hip replacement on the right with prosthetic components appearing well-seated. There is a healing fracture of the left ischium with callus formation in this area and bony remodeling. There is underlying  osteoporosis. No acute appearing fracture evident. No dislocation. There is moderate narrowing of the left hip joint. There is degenerative change in the visualized lower lumbar spine with postoperative changes. There are multiple foci of arterial vascular calcification. IMPRESSION: Status post total hip replacement right with prosthetic components well-seated. No acute fracture evident.  Healing fracture left ischium. Moderate narrowing left hip joint.  No erosive change. Bones osteoporotic. Postoperative change as well as degenerative change lower lumbar spine. Electronically Signed   By: Lowella Grip III M.D.   On: 09/25/2019 12:28    Chart has been reviewed  [id number]  Assessment/Plan  history of anemia, anxiety, CAD status post stent placement, chronic low back pain, CKD, previous CVA, diverticulosis, GERD, hemorrhoids, hypertension, hyperlipidemia, PAF on anticoagulation, GCA, RA, Temporal Arteritis, Admitted for right femoral fracture secondary to fall  Present on Admission: . Femur fracture, right ([place]) -  - management as per orthopedics,  plan to operate   in  a.m.     Keep nothing by mouth post midnight. Patient   on anticoagulation  on hold Ordered type and screen,   order a vitamin D level  Patient at baseline unable to walk a flight of stairs or 100 feet   due to   generalize fatigue and deconditioning  joint pain    Patient denies any chest pain or shortness of breath currently and/or with exertion,   ECG showing no evidence of acute ischemia   known history of coronary artery disease,  CKD  Given advanced age patient is at least moderate  Risk    Cardiology consult for further pre-op clearance given extensive cardiac disease   . Anxiety state -chronic stable continue home medication  . Hyperlipidemia not on statin At baseline   . GERD (gastroesophageal reflux disease) -chronic stable   . CAD (coronary artery disease) - Hold Aspirin continue beta-blocker appreciate  cardiology input EKG   . CKD (chronic kidney disease), stage III -mild chronic avoid nephrotoxic medication   . PAF (paroxysmal atrial fibrillation) (HCC) -hold Eliquis continue metoprolol and diltiazem  for now may need to switch to IV if NPO  . Hypertension -currently stable continue home meds  History of diastolic CHF -currently slightly on the dry side we will gently rehydrate hold Lasix for tonight  . Hypokalemia -replace and check magnesium level  Leukocytosis - in the setting of chronic prednisone use  History of temporary arteritis patient recently been on steroid taper will attempt to taper down as fast as possible patient at baseline takes 10 mg of prednisone.  This may be difficult to completely taper off given chronic use.     Would see if able to decrease down to 10 mg IV if patient tolerates.   Other plan as per orders.  DVT prophylaxis:  SCD      Code Status:    Code Status: DNR   DNR/DNI  as per patient  family  I had personally discussed CODE STATUS with patient and family   Family Communication:   Family  at  Bedside  plan of care was discussed on the phone with  Daughter, Wife, Husband, Sister, Brother , father, mother  Disposition Plan:    likely will need placement for rehabilitation                          Following barriers for discharge:                                                 Pain controlled with PO medications                                                        Will need consultants to evaluate patient prior to discharge                      Would benefit from PT/OT eval prior to DC                      Swallow eval - SLP ordered                     Transition of care consulted                   Nutrition    consulted                                    Behavioral health  consulted                    Consults called: Orthopedics and Trauma Surgery are aware , Emailed Cardiology   Admission status:  ED Disposition    ED Disposition  Condition [place]: [place] [100100]  Level of Care: Telemetry Medical [104]  Covid Evaluation: Asymptomatic Screening Protocol (No Symptoms)  Diagnosis: Femur fracture, right [facility]) [[id number]]  Admitting Physician: Mckinley Jewel [phone]  Attending Physician: Mckinley Jewel [phone]  Estimated length of stay: past midnight tomorrow  Certification:: I certify this patient will need inpatient services for at least  2 midnights           inpatient     I Expect 2 midnight stay secondary to severity of patient's current illness need for inpatient interventions justified by the following:     Severe lab/radiological/exam abnormalities including:  Femoral fracture   and extensive comorbidities including:  Chronic pain  CHF  CAD   CKD Chronic anticoagulation  That are currently affecting medical management.   I expect  patient to be hospitalized for 2 midnights requiring inpatient medical care.  Patient is at high risk for adverse outcome (such as loss of life or disability) if not treated.  Indication for inpatient stay as follows:     severe pain requiring acute inpatient management,  inability to maintain oral hydration    Need for operative/procedural  intervention    Need for  IV fluids,   IV pain medications,     Level of care   tele  For  24H  COVID-19 Labs    Lab Results  Component Value Date   [place] 09/25/2019     Precautions: admitted as Covid Negative  PPE: Used by the provider:   N95  eye Goggles,  Gloves     Emmalise Huard 09/25/2019, 8:53 PM   [facility]     after 2 AM please page floor coverage PA If 7AM-7PM, please contact the day team taking care of the patient using [email or web address]   Patient was evaluated in the context of the global COVID-19 pandemic, which necessitated consideration that the patient might be at risk for infection with the SARS-CoV-2 virus that causes  COVID-19. Institutional protocols and algorithms that pertain to the evaluation of patients at risk for COVID-19 are in a state of rapid change based on information released by regulatory bodies including the CDC and federal and state organizations. These policies and algorithms were followed during the patient's care.

## 2019-09-25 NOTE — ED Notes (Signed)
HIGH FALL RISK CLIENT

## 2019-09-25 NOTE — Progress Notes (Signed)
Patient arrived to 5N daughter at bedside

## 2019-09-25 NOTE — Progress Notes (Signed)
I have communicated with Dr. Alvan Dame regarding the orthopaedic consult request on Mrs. Betty Jackson. He has initiated transfer to [facility] for definitive treatment, ordered the appropriate preoperative studies, and asked for myself or Dr. Doreatha Martin to take over her case. Given the location and complexity of the intraarticular distal femur fracture, Dr. Alvan Dame has asserted this was outside his scope of practice and that it would be in the best interest of the patient to have these injuries evaluated and treated by a fellowship trained orthopaedic traumatologist. Consequently, I was consulted to provide further evaluation and management. She has multiple medical problems and will need to be cleared, but we are hopeful to proceed with limited exposure and fixation, and possibly even percutaneous repair, tomorrow mid morning.  Altamese Erma, MD [facility], [facility] [phone]

## 2019-09-25 NOTE — ED Provider Notes (Signed)
[place] EMERGENCY DEPARTMENT Provider Note   CSN: [id number] Arrival date & time: 09/25/19  1107     History Chief Complaint  Patient presents with  . Fall    Betty Jackson is a 85 y.o. female.  Patient is a an 85 year old female with extensive past medical history including prior CVA, chronic renal insufficiency, paroxysmal A. fib, and recent admissions for C. difficile colitis and sepsis.  She presents today for evaluation of fall.  Patient lives at home with caregivers.  She attempted to get off of the toilet by herself when her left knee gave out and she fell, injuring her right knee.  She has a history of chronic issues with the left knee.  She denies other injury.  The fall occurred yesterday.  The history is provided by the patient.       Past Medical History:  Diagnosis Date  . Acute respiratory failure with hypoxia ([place]) 05/20/2017  . Anemia, unspecified 10/28/2012  . Anxiety state, unspecified 10/28/2012  . CAD (coronary artery disease)    a. Stent RCA in [facility];  b. Cath approx 2009 - nonobs per pt report.  . Cataract    immature on the left eye  . Chronic insomnia 02/07/2013  . Chronic lower back pain    scoliosis  . CKD (chronic kidney disease) stage 3, GFR 30-59 ml/min   . Complication of anesthesia    pt has a very high tolerance to meds  . CVA (cerebral infarction) 10/29/2012  . DDD (degenerative disc disease)   . Depression   . Diverticulitis    hx of  . Diverticulosis   . Enteritis   . GERD (gastroesophageal reflux disease) 09/01/2010  . Giant cell arteritis ([place])   . Hemorrhoids   . Herniated nucleus pulposus, L5-S1, right 11/04/2015  . History of scabies   . HTN (hypertension) 05/20/2017  . Hyperlipidemia    takes Lipitor daily  . Hypertension    takes Amlodipine,Losartan,Metoprolol,and HCTZ daily  . Incontinence of urine   . Insomnia    takes Restoril nightly  . Lumbar stenosis 04/25/2013  . Major depressive disorder,  recurrent episode, moderate ([place]) 07/16/2013  . Migraines    "back in my 20's; none since" (10/28/2012)  . Osteoporosis   . PAF (paroxysmal atrial fibrillation) ([place]) 2011   a. lone epidode in 2011 according to notes.  . Rheumatoid arthritis ([place])   . Scoliosis   . Sepsis ([place]) 05/2019  . Sinus bradycardia    a. on chronic bb  . Temporal arteritis ([place]) 2011   a. followed @ [facility]; potential flareup 10/28/2012/notes 10/28/2012  . Vocal cord dysfunction    "they don't operate properly" (10/28/2012)    Patient Active Problem List   Diagnosis Date Noted  . HCAP (healthcare-associated pneumonia) 06/19/2019  . Encephalopathy 06/19/2019  . Lumbar degenerative disc disease   . Paroxysmal A-fib ([place])   . Hypomagnesemia   . C. difficile colitis   . Diarrhea 05/29/2019  . CHF (congestive heart failure) ([place]) 05/29/2019  . Recurrent falls 04/30/2019  . Pericardial effusion without cardiac tamponade 03/29/2019  . Hypoalbuminemia 03/27/2019  . Cardiac tamponade   . Pericarditis 03/06/2019  . Fever   . Septic shock ([place])   . Atypical chest pain   . Atrial fibrillation with rapid ventricular response ([place])   . Nausea 01/17/2019  . Acute diverticulitis 01/13/2019  . AKI (acute kidney injury) ([place]) 01/13/2019  . Chronic back pain 01/13/2019  . Tachycardia 01/13/2019  .  Hypokalemia 01/13/2019  . Chronic low back pain 07/10/2018  . Influenza A 05/20/2017  . Sepsis ([place]) 05/20/2017  . Acute respiratory failure with hypoxia ([place]) 05/20/2017  . CKD (chronic kidney disease), stage III 05/20/2017  . PAF (paroxysmal atrial fibrillation) ([place]) 05/20/2017  . HTN (hypertension) 05/20/2017  . Giant cell arteritis ([place]) 05/20/2017  . Coronary disease 05/20/2017  . Abnormal urinalysis 05/20/2017  . Osteoporosis 05/08/2016  . Herniated nucleus pulposus, L5-S1, right 11/04/2015  . Major depressive disorder, recurrent episode, moderate ([place]) 07/16/2013  . Abdominal pain, epigastric 06/22/2013  . Lumbar  stenosis 04/25/2013  . Chronic insomnia 02/07/2013  . Elevated transaminase level 02/07/2013  . CVA (cerebral infarction) 10/29/2012  . Visual changes 10/28/2012  . Depression 10/28/2012  . Anxiety state, unspecified 10/28/2012  . Anemia, unspecified 10/28/2012  . Nonspecific (abnormal) findings on radiological and other examination of gastrointestinal tract 01/25/2012  . Hyponatremia 01/24/2012  . Hematuria 01/24/2012  . Enteritis 12/24/2011  . Abdominal pain, other specified site 12/24/2011  . Leg pain, right 02/18/2011  . Temporal arteritis ([place]) 09/01/2010  . Hypertension 09/01/2010  . Hyperlipidemia 09/01/2010  . History of atrial fibrillation 09/01/2010  . CAD (coronary artery disease) 09/01/2010  . GERD (gastroesophageal reflux disease) 09/01/2010  . Insomnia 09/01/2010    Past Surgical History:  Procedure Laterality Date  . ABDOMINAL HYSTERECTOMY  ~ 1984   vaginally  . BACK SURGERY  7-30yrs ago   X Stop  . BLADDER SUSPENSION  2001  . BREAST BIOPSY Right   . CATARACT EXTRACTION W/ INTRAOCULAR LENS IMPLANT Right ~ 08/2012  . CHEST TUBE INSERTION Left 03/08/2019   Procedure: Chest Tube Insertion;  Surgeon: Ivin Poot, MD;  Location: [facility];  Service: Thoracic;  Laterality: Left;  . COLONOSCOPY  01/26/2012   Procedure: COLONOSCOPY;  Surgeon: Ladene Artist, MD,FACG;  Location: [facility] ENDOSCOPY;  Service: Endoscopy;  Laterality: N/A;  note the EGD is possible  . CORONARY ANGIOPLASTY WITH STENT PLACEMENT  2006   X 1 stent  . EPIDURAL BLOCK INJECTION    . ESOPHAGOGASTRODUODENOSCOPY  01/26/2012   Procedure: ESOPHAGOGASTRODUODENOSCOPY (EGD);  Surgeon: Ladene Artist, MD,FACG;  Location: [facility] ENDOSCOPY;  Service: Endoscopy;  Laterality: N/A;  . HEMIARTHROPLASTY HIP Right 2012  . IR EPIDUROGRAPHY  04/20/2019  . LAPAROSCOPIC CHOLECYSTECTOMY  2001  . LUMBAR FUSION  03/2013  . LUMBAR LAMINECTOMY/DECOMPRESSION MICRODISCECTOMY Right 11/04/2015   Procedure: Right Lumbar Five-Sacral One  Microdiskectomy;  Surgeon: Kristeen Miss, MD;  Location: [facility] NEURO ORS;  Service: Neurosurgery;  Laterality: Right;  Right L5-S1 Microdiskectomy  . moles removed that required stiches     one on leg and one on face  . SUBXYPHOID PERICARDIAL WINDOW N/A 03/08/2019   Procedure: SUBXYPHOID PERICARDIAL WINDOW;  Surgeon: Ivin Poot, MD;  Location: [place];  Service: Thoracic;  Laterality: N/A;  . TEE WITHOUT CARDIOVERSION N/A 03/08/2019   Procedure: TRANSESOPHAGEAL ECHOCARDIOGRAM (TEE);  Surgeon: Prescott Gum, Collier Salina, MD;  Location: [place];  Service: Thoracic;  Laterality: N/A;  . TEMPORAL ARTERY BIOPSY / LIGATION Bilateral 2011  . TONSILLECTOMY AND ADENOIDECTOMY     at age 52  . TUBAL LIGATION  ~ 1982  . X-STOP IMPLANTATION  ~ 2010   "lower back" (10/28/2012)     OB History   No obstetric history on file.     Family History  Problem Relation Age of Onset  . Brain cancer Mother 58  . Heart attack Father   . Breast cancer Daughter 41  . Heart  disease Other   . Hypertension Other   . Arthritis Neg Hx   . Colon cancer Neg Hx   . Osteoporosis Neg Hx     Social History   Tobacco Use  . Smoking status: Never Smoker  . Smokeless tobacco: Never Used  Vaping Use  . Vaping Use: Never used  Substance Use Topics  . Alcohol use: Yes    Comment: socially  . Drug use: No    Home Medications Prior to Admission medications   Medication Sig Start Date End Date Taking? Authorizing Provider  alendronate (FOSAMAX) 70 MG tablet Take 1 tablet (70 mg total) by mouth every 7 (seven) days. Take with a full glass of water on an empty stomach. 04/28/19   Burchette, Alinda Sierras, MD  cephALEXin (KEFLEX) 500 MG capsule Take 1 capsule (500 mg total) by mouth 3 (three) times daily. 09/01/19   Burchette, Alinda Sierras, MD  Cholecalciferol (VITAMIN D-3) 25 MCG (1000 UT) CAPS Take 1,000 Units by mouth daily.    [provider]  diltiazem (CARDIZEM CD) 240 MG 24 hr capsule Take 1 capsule (240 mg total) by mouth daily.  07/19/19   Bhagat, Crista Luria, PA  ELIQUIS 5 MG TABS tablet TAKE 1 TABLET BY MOUTH TWICE A DAY 05/15/19   Burnell Blanks, MD  escitalopram (LEXAPRO) 10 MG tablet TAKE 1 TABLET BY MOUTH EVERY DAY 12/14/18   Burchette, Alinda Sierras, MD  famotidine (PEPCID) 20 MG tablet Take 1 tablet (20 mg total) by mouth daily. 06/02/19   Eugenie Filler, MD  fentaNYL (DURAGESIC) 12 MCG/HR Place 1 patch onto the skin every 3 (three) days. 07/19/19   Burchette, Alinda Sierras, MD  fentaNYL (DURAGESIC) 12 MCG/HR Apply one patch every 72 hours.  May refill in one month 07/19/19   Burchette, Alinda Sierras, MD  fentaNYL (DURAGESIC) 12 MCG/HR Apply one patch every 72 hours..   May refill in two months. 07/19/19   Burchette, Alinda Sierras, MD  ferrous sulfate 325 (65 FE) MG tablet Take 1 tablet (325 mg total) by mouth 2 (two) times daily with a meal. 04/21/19   Oswald Hillock, MD  furosemide (LASIX) 40 MG tablet Take 1 tablet (40 mg total) by mouth daily. 06/03/19   Eugenie Filler, MD  HYDROcodone-acetaminophen [facility]) 10-325 MG tablet Take one tablet by mouth every 8 hours as needed for breakthrough pain. 09/06/19   Burchette, Alinda Sierras, MD  lactobacillus acidophilus (BACID) TABS tablet Take 2 tablets by mouth 3 (three) times daily. 06/01/19   Eugenie Filler, MD  metoprolol tartrate (LOPRESSOR) 100 MG tablet Take 1 tablet (100 mg total) by mouth 2 (two) times daily. 07/24/19   Burnell Blanks, MD  potassium chloride SA (KLOR-CON) 10 MEQ tablet Take 1 tablet (10 mEq total) by mouth daily. 04/21/19   Oswald Hillock, MD  predniSONE (DELTASONE) 10 MG tablet Take 1 tablet (10 mg total) by mouth daily. 04/21/19   Oswald Hillock, MD  vancomycin (VANCOCIN) 125 MG capsule Take one capsule every other day until 5/23 and then take one capsule every 3 days until prescription completed. 08/14/19   Burchette, Alinda Sierras, MD  vitamin C (ASCORBIC ACID) 500 MG tablet Take 1,000 mg by mouth daily.     [provider]  vitamin E (VITAMIN E) 400 UNIT  capsule Take 400 Units by mouth daily.    [provider]    Allergies    Dextromethorphan  Review of Systems   Review of Systems  All other systems reviewed and are negative.   Physical Exam Updated Vital Signs BP (!) 191/80   Pulse (!) 55   Temp 98.2 F (36.8 C) (Oral)   Resp 20   Ht 5\' 4"  (1.626 m)   Wt 68 kg   SpO2 96%   BMI 25.75 kg/m   Physical Exam Vitals and nursing note reviewed.  Constitutional:      General: She is not in acute distress.    Appearance: She is well-developed. She is not diaphoretic.  HENT:     Head: Normocephalic and atraumatic.  Cardiovascular:     Rate and Rhythm: Normal rate and regular rhythm.     Heart sounds: No murmur heard.  No friction rub. No gallop.   Pulmonary:     Effort: Pulmonary effort is normal. No respiratory distress.     Breath sounds: Normal breath sounds. No wheezing.  Abdominal:     General: Bowel sounds are normal. There is no distension.     Palpations: Abdomen is soft.     Tenderness: There is no abdominal tenderness.  Musculoskeletal:        General: Normal range of motion.     Cervical back: Normal range of motion and neck supple.     Comments: There is swelling and small effusion to the right knee.  There is pain with range of motion.  Distal PMS is intact.  Skin:    General: Skin is warm and dry.  Neurological:     Mental Status: She is alert and oriented to person, place, and time.     ED Results / Procedures / Treatments   Labs (all labs ordered are listed, but only abnormal results are displayed) Labs Reviewed - No data to display  EKG None  Radiology No results found.  Procedures Procedures (including critical care time)  Medications Ordered in ED Medications - No data to display  ED Course  I have reviewed the triage vital signs and the nursing notes.  Pertinent labs & imaging results that were available during my care of the patient were reviewed by me and considered in my  medical decision making (see chart for details).    MDM Rules/Calculators/A&P  Patient is an 85 year old female presenting with complaints of fall.  She is having pain in her right knee.  The fall occurred yesterday and she has no other complaints.  X-rays today reveal an acute fracture of the medial cortex of the distal metaphysis of the right femur with minimal displacement.  This finding was discussed with Dr. Alvan Dame from orthopedic surgery.  He has recommended a CT scan along with a bulky dressing and knee immobilizer.  He also recommends admission to the hospitalist service for surgical repair.  Patient is on Eliquis and this will need to be held prior to surgery.  Final Clinical Impression(s) / ED Diagnoses Final diagnoses:  None    Rx / DC Orders ED Discharge Orders    None       Veryl Speak, MD 09/25/19 1501

## 2019-09-25 NOTE — ED Notes (Signed)
DAUGHTER: YVGCYOY  [phone]

## 2019-09-25 NOTE — ED Notes (Signed)
Attempted to call report, RN unavailable, unit asked this RN to call back in 20 min

## 2019-09-25 NOTE — ED Notes (Signed)
Fell yesterday, pain c/o rt knee pain with pain radiating to rt hip, EDP at bedside for evaluation. Pt able to plantar and dorsal flexion of rt foot without difficulty. Family states she is on a regular dosing of Hydrocodone and utilizes a 12.5mg  Fentanyl Patch for chronic hip pain

## 2019-09-25 NOTE — ED Notes (Signed)
[facility] Transport Team at bedside 

## 2019-09-25 NOTE — ED Notes (Signed)
Resting quietly, states rt leg feels much better, CMS WNL of RLE. Pt instructed to remain NPO until further orders. Family updated of pt status

## 2019-09-25 NOTE — ED Triage Notes (Signed)
She fell yesterday while going to the bathroom. Injury to her right knee. Swelling noted.

## 2019-09-26 ENCOUNTER — Inpatient Hospital Stay (HOSPITAL_COMMUNITY): Payer: Medicare Other | Admitting: Certified Registered Nurse Anesthetist

## 2019-09-26 ENCOUNTER — Encounter (HOSPITAL_COMMUNITY): Payer: Self-pay | Admitting: Internal Medicine

## 2019-09-26 ENCOUNTER — Encounter: Payer: Self-pay | Admitting: Family Medicine

## 2019-09-26 ENCOUNTER — Inpatient Hospital Stay (HOSPITAL_COMMUNITY): Payer: Medicare Other

## 2019-09-26 ENCOUNTER — Encounter (HOSPITAL_COMMUNITY)
Admission: EM | Disposition: A | Discharge status: Skilled Nursing Facility | Payer: Self-pay | Source: Home / Self Care | Attending: Internal Medicine

## 2019-09-26 DIAGNOSIS — Z01810 Encounter for preprocedural cardiovascular examination: Secondary | ICD-10-CM

## 2019-09-26 HISTORY — PX: FINE NEEDLE ASPIRATION: SHX6590

## 2019-09-26 HISTORY — PX: ORIF FEMUR FRACTURE: SHX2119

## 2019-09-26 LAB — CBC
HCT: 37.8 % (ref 36.0–46.0)
Hemoglobin: 11.9 g/dL — ABNORMAL LOW (ref 12.0–15.0)
MCH: 29.2 pg (ref 26.0–34.0)
MCHC: 31.5 g/dL (ref 30.0–36.0)
MCV: 92.6 fL (ref 80.0–100.0)
Platelets: 243 10*3/uL (ref 150–400)
RBC: 4.08 MIL/uL (ref 3.87–5.11)
RDW: 14.9 % (ref 11.5–15.5)
WBC: 14.1 10*3/uL — ABNORMAL HIGH (ref 4.0–10.5)
nRBC: 0 % (ref 0.0–0.2)

## 2019-09-26 LAB — MAGNESIUM: Magnesium: 1.8 mg/dL (ref 1.7–2.4)

## 2019-09-26 LAB — BASIC METABOLIC PANEL
Anion gap: 11 (ref 5–15)
BUN: 17 mg/dL (ref 8–23)
CO2: 32 mmol/L (ref 22–32)
Calcium: 8.6 mg/dL — ABNORMAL LOW (ref 8.9–10.3)
Chloride: 99 mmol/L (ref 98–111)
Creatinine, Ser: 0.96 mg/dL (ref 0.44–1.00)
GFR calc Af Amer: >60 mL/min (ref >60)
GFR calc non Af Amer: 55 mL/min — ABNORMAL LOW (ref >60)
Glucose, Bld: 98 mg/dL (ref 70–99)
Potassium: 3.7 mmol/L (ref 3.5–5.1)
Sodium: 142 mmol/L (ref 135–145)

## 2019-09-26 LAB — MRSA PCR SCREENING: MRSA by PCR: NEGATIVE

## 2019-09-26 LAB — VITAMIN D 25 HYDROXY (VIT D DEFICIENCY, FRACTURES): Vit D, 25-Hydroxy: 27.66 ng/mL — ABNORMAL LOW (ref 30–100)

## 2019-09-26 SURGERY — OPEN REDUCTION INTERNAL FIXATION (ORIF) DISTAL FEMUR FRACTURE
Anesthesia: General | Site: Leg Upper | Laterality: Right

## 2019-09-26 MED ORDER — HYDROMORPHONE HCL 1 MG/ML IJ SOLN: 0.2500 mg | INTRAMUSCULAR | Status: DC | PRN

## 2019-09-26 MED ORDER — PROPOFOL 10 MG/ML IV BOLUS
INTRAVENOUS | Status: DC | PRN
  Administered 2019-09-26: 100 mg via INTRAVENOUS

## 2019-09-26 MED ORDER — CHLORHEXIDINE GLUCONATE 4 % EX LIQD: 60.0000 mL | Freq: Once | CUTANEOUS | Status: DC

## 2019-09-26 MED ORDER — CEFAZOLIN SODIUM-DEXTROSE 2-4 GM/100ML-% IV SOLN
2.0000 g | INTRAVENOUS | Status: AC
  Administered 2019-09-26: 2 g via INTRAVENOUS

## 2019-09-26 MED ORDER — CHLORHEXIDINE GLUCONATE 0.12 % MT SOLN
OROMUCOSAL | Status: AC
  Filled 2019-09-26: qty 15

## 2019-09-26 MED ORDER — ONDANSETRON HCL 4 MG/2ML IJ SOLN
INTRAMUSCULAR | Status: DC | PRN
  Administered 2019-09-26: 4 mg via INTRAVENOUS

## 2019-09-26 MED ORDER — PHENYLEPHRINE HCL (PRESSORS) 10 MG/ML IV SOLN
INTRAVENOUS | Status: DC | PRN
  Administered 2019-09-26: 80 ug via INTRAVENOUS

## 2019-09-26 MED ORDER — OXYCODONE HCL 5 MG/5ML PO SOLN: 5.0000 mg | Freq: Once | ORAL | Status: DC | PRN

## 2019-09-26 MED ORDER — CEFAZOLIN SODIUM-DEXTROSE 2-4 GM/100ML-% IV SOLN
INTRAVENOUS | Status: AC
  Filled 2019-09-26: qty 100

## 2019-09-26 MED ORDER — ONDANSETRON HCL 4 MG/2ML IJ SOLN: 4.0000 mg | Freq: Once | INTRAMUSCULAR | Status: DC | PRN

## 2019-09-26 MED ORDER — ROCURONIUM BROMIDE 10 MG/ML (PF) SYRINGE
PREFILLED_SYRINGE | INTRAVENOUS | Status: DC | PRN
  Administered 2019-09-26: 50 mg via INTRAVENOUS

## 2019-09-26 MED ORDER — ACETAMINOPHEN 10 MG/ML IV SOLN
INTRAVENOUS | Status: DC | PRN
Start: 2019-09-26 — End: 2019-09-26
  Administered 2019-09-26: 1000 mg via INTRAVENOUS

## 2019-09-26 MED ORDER — ONDANSETRON HCL 4 MG/2ML IJ SOLN
INTRAMUSCULAR | Status: AC
  Filled 2019-09-26: qty 2

## 2019-09-26 MED ORDER — ALBUMIN HUMAN 5 % IV SOLN
INTRAVENOUS | Status: DC | PRN
Start: 2019-09-26 — End: 2019-09-26

## 2019-09-26 MED ORDER — LIDOCAINE 2% (20 MG/ML) 5 ML SYRINGE
INTRAMUSCULAR | Status: AC
  Filled 2019-09-26: qty 5

## 2019-09-26 MED ORDER — DEXAMETHASONE SODIUM PHOSPHATE 10 MG/ML IJ SOLN
INTRAMUSCULAR | Status: DC | PRN
  Administered 2019-09-26: 10 mg via INTRAVENOUS

## 2019-09-26 MED ORDER — PROPOFOL 10 MG/ML IV BOLUS
INTRAVENOUS | Status: AC
  Filled 2019-09-26: qty 20

## 2019-09-26 MED ORDER — LACTATED RINGERS IV SOLN: INTRAVENOUS | Status: DC

## 2019-09-26 MED ORDER — DEXAMETHASONE SODIUM PHOSPHATE 10 MG/ML IJ SOLN
INTRAMUSCULAR | Status: AC
  Filled 2019-09-26: qty 1

## 2019-09-26 MED ORDER — ACETAMINOPHEN 500 MG PO TABS
1000.0000 mg | ORAL_TABLET | Freq: Once | ORAL | Status: DC
  Filled 2019-09-26: qty 2

## 2019-09-26 MED ORDER — FENTANYL CITRATE (PF) 250 MCG/5ML IJ SOLN
INTRAMUSCULAR | Status: AC
  Filled 2019-09-26: qty 5

## 2019-09-26 MED ORDER — CHLORHEXIDINE GLUCONATE 0.12 % MT SOLN: 15.0000 mL | Freq: Once | OROMUCOSAL | Status: DC

## 2019-09-26 MED ORDER — POVIDONE-IODINE 10 % EX SWAB
2.0000 "application " | Freq: Once | CUTANEOUS | Status: DC
  Administered 2019-09-26: 2 via TOPICAL

## 2019-09-26 MED ORDER — SUGAMMADEX SODIUM 200 MG/2ML IV SOLN
INTRAVENOUS | Status: DC | PRN
  Administered 2019-09-26: 200 mg via INTRAVENOUS

## 2019-09-26 MED ORDER — HYDROCORTISONE NA SUCCINATE PF 100 MG IJ SOLR
INTRAMUSCULAR | Status: DC | PRN
  Administered 2019-09-26: 125 mg via INTRAVENOUS

## 2019-09-26 MED ORDER — SUCCINYLCHOLINE CHLORIDE 200 MG/10ML IV SOSY
PREFILLED_SYRINGE | INTRAVENOUS | Status: DC | PRN
  Administered 2019-09-26: 60 mg via INTRAVENOUS

## 2019-09-26 MED ORDER — 0.9 % SODIUM CHLORIDE (POUR BTL) OPTIME
TOPICAL | Status: DC | PRN
  Administered 2019-09-26: 1000 mL

## 2019-09-26 MED ORDER — LIDOCAINE 2% (20 MG/ML) 5 ML SYRINGE
INTRAMUSCULAR | Status: DC | PRN
  Administered 2019-09-26: 40 mg via INTRAVENOUS

## 2019-09-26 MED ORDER — FENTANYL CITRATE (PF) 250 MCG/5ML IJ SOLN
INTRAMUSCULAR | Status: DC | PRN
  Administered 2019-09-26: 50 ug via INTRAVENOUS
  Administered 2019-09-26 (×2): 25 ug via INTRAVENOUS
  Administered 2019-09-26: 50 ug via INTRAVENOUS

## 2019-09-26 MED ORDER — ROCURONIUM BROMIDE 10 MG/ML (PF) SYRINGE
PREFILLED_SYRINGE | INTRAVENOUS | Status: AC
  Filled 2019-09-26: qty 10

## 2019-09-26 MED ORDER — PHENYLEPHRINE HCL-NACL 10-0.9 MG/250ML-% IV SOLN
INTRAVENOUS | Status: DC | PRN
  Administered 2019-09-26: 15 ug/min via INTRAVENOUS

## 2019-09-26 MED ORDER — OXYCODONE HCL 5 MG PO TABS: 5.0000 mg | ORAL_TABLET | Freq: Once | ORAL | Status: DC | PRN

## 2019-09-26 MED ORDER — EPHEDRINE SULFATE 50 MG/ML IJ SOLN
INTRAMUSCULAR | Status: DC | PRN
  Administered 2019-09-26: 10 mg via INTRAVENOUS
  Administered 2019-09-26: 5 mg via INTRAVENOUS

## 2019-09-26 SURGICAL SUPPLY — 78 items
BIT DRILL 2.5 X LONG (BIT) ×2
BIT DRILL X LONG 2.5 (BIT) ×2 IMPLANT
BLADE CLIPPER SURG (BLADE) IMPLANT
BNDG ELASTIC 4X5.8 VLCR STR LF (GAUZE/BANDAGES/DRESSINGS) ×4 IMPLANT
BNDG ELASTIC 6X5.8 VLCR STR LF (GAUZE/BANDAGES/DRESSINGS) ×4 IMPLANT
BNDG GAUZE ELAST 4 BULKY (GAUZE/BANDAGES/DRESSINGS) ×4 IMPLANT
BRUSH SCRUB EZ PLAIN DRY (MISCELLANEOUS) ×8 IMPLANT
CANISTER SUCT 3000ML PPV (MISCELLANEOUS) ×4 IMPLANT
COVER SURGICAL LIGHT HANDLE (MISCELLANEOUS) ×4 IMPLANT
COVER WAND RF STERILE (DRAPES) ×4 IMPLANT
DRAPE C-ARM 42X72 X-RAY (DRAPES) ×4 IMPLANT
DRAPE C-ARMOR (DRAPES) ×4 IMPLANT
DRAPE IMP U-DRAPE 54X76 (DRAPES) ×4 IMPLANT
DRAPE ORTHO SPLIT 77X108 STRL (DRAPES) ×6
DRAPE SURG ORHT 6 SPLT 77X108 (DRAPES) ×6 IMPLANT
DRAPE U-SHAPE 47X51 STRL (DRAPES) ×4 IMPLANT
DRILL BIT X LONG 2.5 (BIT) ×2
DRSG ADAPTIC 3X8 NADH LF (GAUZE/BANDAGES/DRESSINGS) ×4 IMPLANT
DRSG MEPILEX BORDER 4X4 (GAUZE/BANDAGES/DRESSINGS) ×12 IMPLANT
DRSG MEPITEL 3X4 ME34 (GAUZE/BANDAGES/DRESSINGS) ×4 IMPLANT
DRSG PAD ABDOMINAL 8X10 ST (GAUZE/BANDAGES/DRESSINGS) ×16 IMPLANT
ELECT REM PT RETURN 9FT ADLT (ELECTROSURGICAL) ×4
ELECTRODE REM PT RTRN 9FT ADLT (ELECTROSURGICAL) ×2 IMPLANT
EVACUATOR 1/8 PVC DRAIN (DRAIN) IMPLANT
EVACUATOR 3/16  PVC DRAIN (DRAIN)
EVACUATOR 3/16 PVC DRAIN (DRAIN) IMPLANT
GAUZE SPONGE 4X4 12PLY STRL (GAUZE/BANDAGES/DRESSINGS) ×4 IMPLANT
GLOVE BIO SURGEON STRL SZ7.5 (GLOVE) ×4 IMPLANT
GLOVE BIO SURGEON STRL SZ8 (GLOVE) ×4 IMPLANT
GLOVE BIOGEL PI IND STRL 7.5 (GLOVE) ×2 IMPLANT
GLOVE BIOGEL PI IND STRL 8 (GLOVE) ×2 IMPLANT
GLOVE BIOGEL PI INDICATOR 7.5 (GLOVE) ×2
GLOVE BIOGEL PI INDICATOR 8 (GLOVE) ×2
GOWN STRL REUS W/ TWL LRG LVL3 (GOWN DISPOSABLE) ×4 IMPLANT
GOWN STRL REUS W/ TWL XL LVL3 (GOWN DISPOSABLE) ×2 IMPLANT
GOWN STRL REUS W/TWL LRG LVL3 (GOWN DISPOSABLE) ×4
GOWN STRL REUS W/TWL XL LVL3 (GOWN DISPOSABLE) ×2
IMMOBILIZER KNEE 22 UNIV (SOFTGOODS) ×4 IMPLANT
K-WIRE 5 THRD TROCAR 1.6X150 (WIRE) ×8
KIT BASIN OR (CUSTOM PROCEDURE TRAY) ×4 IMPLANT
KIT TURNOVER KIT B (KITS) ×4 IMPLANT
KWIRE 5 THRD TROCAR 1.6X150 (WIRE) ×4 IMPLANT
NEEDLE 22X1 1/2 (OR ONLY) (NEEDLE) IMPLANT
NEEDLE SPNL 18GX3.5 QUINCKE PK (NEEDLE) ×4 IMPLANT
NS IRRIG 1000ML POUR BTL (IV SOLUTION) ×4 IMPLANT
PACK TOTAL JOINT (CUSTOM PROCEDURE TRAY) ×4 IMPLANT
PACK UNIVERSAL I (CUSTOM PROCEDURE TRAY) ×4 IMPLANT
PAD ARMBOARD 7.5X6 YLW CONV (MISCELLANEOUS) ×8 IMPLANT
PAD CAST 4YDX4 CTTN HI CHSV (CAST SUPPLIES) ×2 IMPLANT
PADDING CAST COTTON 4X4 STRL (CAST SUPPLIES) ×2
PADDING CAST COTTON 6X4 STRL (CAST SUPPLIES) ×4 IMPLANT
PIN GUIDE DRILL TIP 2.8X300 (DRILL) ×8 IMPLANT
PLATE LCP RECON 3.5 5H/70 (Plate) ×4 IMPLANT
SCREW CANNULATED 8.8X80MM HIP (Screw) ×4 IMPLANT
SCREW CANNULATED HIP 8.0X75MM (Screw) ×4 IMPLANT
SCREW CORT 3.5X42M SELF TAP (Screw) ×4 IMPLANT
SCREW CORTEX 3.5 38MM (Screw) ×2 IMPLANT
SCREW LOCK CORT ST 3.5X38 (Screw) ×2 IMPLANT
SPONGE LAP 18X18 RF (DISPOSABLE) ×4 IMPLANT
STAPLER VISISTAT 35W (STAPLE) ×4 IMPLANT
SUCTION FRAZIER HANDLE 10FR (MISCELLANEOUS) ×2
SUCTION TUBE FRAZIER 10FR DISP (MISCELLANEOUS) ×2 IMPLANT
SUT ETHILON 2 0 FS 18 (SUTURE) ×8 IMPLANT
SUT PROLENE 0 CT 2 (SUTURE) IMPLANT
SUT VIC AB 0 CT1 27 (SUTURE) ×4
SUT VIC AB 0 CT1 27XBRD ANBCTR (SUTURE) ×4 IMPLANT
SUT VIC AB 1 CT1 27 (SUTURE) ×4
SUT VIC AB 1 CT1 27XBRD ANBCTR (SUTURE) ×4 IMPLANT
SUT VIC AB 2-0 CT1 27 (SUTURE) ×4
SUT VIC AB 2-0 CT1 TAPERPNT 27 (SUTURE) ×4 IMPLANT
SYR 20ML ECCENTRIC (SYRINGE) IMPLANT
SYR 50ML LL SCALE MARK (SYRINGE) ×4 IMPLANT
TOWEL GREEN STERILE (TOWEL DISPOSABLE) ×8 IMPLANT
TOWEL GREEN STERILE FF (TOWEL DISPOSABLE) ×4 IMPLANT
TRAY FOLEY MTR SLVR 16FR STAT (SET/KITS/TRAYS/PACK) IMPLANT
WASHER 8.0 (Orthopedic Implant) ×4 IMPLANT
WASHER CANN FLAT 8 (Orthopedic Implant) ×4 IMPLANT
WATER STERILE IRR 1000ML POUR (IV SOLUTION) ×8 IMPLANT

## 2019-09-26 NOTE — Transfer of Care (Signed)
Immediate Anesthesia Transfer of Care Note  Patient: Betty Jackson  Procedure(s) Performed: OPEN REDUCTION INTERNAL FIXATION (ORIF) DISTAL FEMUR FRACTURE (Right Leg Upper) FINE NEEDLE ASPIRATION (Right Knee)  Patient Location: PACU  Anesthesia Type:General  Level of Consciousness: sedated  Airway & Oxygen Therapy: Patient Spontanous Breathing and Patient connected to nasal cannula oxygen  Post-op Assessment: Report given to RN and Post -op Vital signs reviewed and stable  Post vital signs: Reviewed and stable  Last Vitals:  Vitals Value Taken Time  BP 143/59 09/26/19 1414  Temp    Pulse 70 09/26/19 1417  Resp 21 09/26/19 1417  SpO2 97 % 09/26/19 1417  Vitals shown include unvalidated device data.  Last Pain:  Vitals:   09/26/19 1414  TempSrc:   PainSc: (P) Asleep         Complications: No complications documented.

## 2019-09-26 NOTE — Consult Note (Addendum)
Cardiology Consultation:   Patient ID: SHAKINA CHOY MRN: [id number]; DOB: 12-26-1934  Admit date: 09/25/2019 Date of Consult: 09/26/2019  Primary Care Provider: Eulas Post, MD [facility] Cardiologist: [name], MD  [facility] Electrophysiologist:  None    Patient Profile:   PURVI RUEHL is a 85 y.o. female with a hx of  PAF, CAD, HTN, HLD and a history of temporal arteritis on long-term steroid therapy who is being seen today for the evaluation of preoperative clearance prior to femur fracture repair at the request of Dr. Roel Cluck.  History of Present Illness:   Ms. Shackett is a 85 year old female with past medical history of PAF, CAD, HTN, HLD and a history of temporal arteritis on long-term steroid therapy.  He had stent to the RCA in 2005.  Repeat cardiac catheterization 11/2012 showed patent RCA, mild disease in left circumflex and LAD.  Myoview in April 2016 was normal.  Patient has had recurrent atrial fibrillation in the setting of stress secondary to infection.  She was initially diagnosed with atrial fibrillation in the setting of influenza A and a pneumonia in February 2019.  Initially she was not discharged on anticoagulation however heart monitor obtained later demonstrated recurrence of atrial fibrillation.  She was started on Eliquis in April 2019.  She has had a recurrence of atrial fibrillation in October 2020 in the setting of diverticulitis.  Echocardiogram obtained at the time showed EF 60 to 65%, no significant valve issue.  Metoprolol was increased to 100 mg twice daily, diltiazem increased to 60 mg every 8 hours.   Patient was diagnosed with pericardial effusion in December 2020.  Limited echocardiogram obtained on March 06, 2019 demonstrated moderate pericardial effusion with normal EF.  Patient eventually underwent subxiphoid pericardial window by Dr. Prescott Gum 03/08/2019.  Cardiology service was not formally consulted at the time.  During the  hospitalization, she was started on amiodarone therapy.    Patient was readmitted in January 2021 with sepsis, potassium was as low as 2.6.  She also has some anemia as well during the hospital admission.  She had frequent fall and weakness at the time and suffered a left ischium fracture.  CT angiogram of the chest abdomen showed mosaic pattern of groundglass attenuation likely related to small vessel disease versus hypersensitivity pneumonitis.  We discontinued his amiodarone therapy at the time.  EKG obtained at the time showed T wave inversion in the anterior leads.  However patient denies any exertional chest pain.  She does have intermittent brief chest pain that occurs once every few months, this is chronic.  Given lack of change in her overall symptom, Dr. Gardiner Rhyme recommended consider Myoview as outpatient.    She was seen by Dr. Angelena Form for hospital follow-up, since she was doing well without any obvious anginal symptom, no Myoview was ordered.  Breakthrough was readmitted in February 2021 due to sepsis secondary to C. difficile colitis.  She developed recurrent atrial fibrillation however converted on IV digoxin.  She was sent home on higher dose of Cardizem 240 mg daily.  Cardizem dose was later reduced 200 mg daily due to bradycardia. Patient had recurrent admissions to March 2021 due to hospital-acquired pneumonia.  Patient presented back to the hospital on 09/17/2019 after suffering mechanical fall while trying to get up from the bathroom.  She says her knee gave out.  She also has been using fentanyl patch at home for chronic pain.  In the past year, she has had frequent fall  due to leg weakness, this is further exacerbated recently due to recurrent hospital admission deconditioning.  Unfortunately her husband passed away 4 weeks ago.  According to patient, she still occasionally has intermittent chest discomfort, this is chronic for her and has not increase in frequency or duration.   Interestingly enough, the previously seen T wave inversion in the anterior leads has completely resolved and her EKG has normalized.  Due to the mechanical fall she had, she has suffered a oblique fracture in the distal femur.  He has been seen by orthopedic service and plans to proceed with ORIF.  Cardiology service has been consulted for preoperative clearance.   Past Medical History:  Diagnosis Date  . Acute respiratory failure with hypoxia ([place]) 05/20/2017  . Anemia, unspecified 10/28/2012  . Anxiety state, unspecified 10/28/2012  . CAD (coronary artery disease)    a. Stent RCA in [facility];  b. Cath approx 2009 - nonobs per pt report.  . Cataract    immature on the left eye  . Chronic insomnia 02/07/2013  . Chronic lower back pain    scoliosis  . CKD (chronic kidney disease) stage 3, GFR 30-59 ml/min   . Complication of anesthesia    pt has a very high tolerance to meds  . CVA (cerebral infarction) 10/29/2012  . DDD (degenerative disc disease)   . Depression   . Diverticulitis    hx of  . Diverticulosis   . Enteritis   . GERD (gastroesophageal reflux disease) 09/01/2010  . Giant cell arteritis ([place])   . Hemorrhoids   . Herniated nucleus pulposus, L5-S1, right 11/04/2015  . History of scabies   . HTN (hypertension) 05/20/2017  . Hyperlipidemia    takes Lipitor daily  . Hypertension    takes Amlodipine,Losartan,Metoprolol,and HCTZ daily  . Incontinence of urine   . Insomnia    takes Restoril nightly  . Lumbar stenosis 04/25/2013  . Major depressive disorder, recurrent episode, moderate ([place]) 07/16/2013  . Migraines    "back in my 20's; none since" (10/28/2012)  . Osteoporosis   . PAF (paroxysmal atrial fibrillation) ([place]) 2011   a. lone epidode in 2011 according to notes.  . Rheumatoid arthritis ([place])   . Scoliosis   . Sepsis ([place]) 05/2019  . Sinus bradycardia    a. on chronic bb  . Temporal arteritis ([place]) 2011   a. followed @ [facility]; potential flareup 10/28/2012/notes  10/28/2012  . Vocal cord dysfunction    "they don't operate properly" (10/28/2012)    Past Surgical History:  Procedure Laterality Date  . ABDOMINAL HYSTERECTOMY  ~ 1984   vaginally  . BACK SURGERY  7-38yrs ago   X Stop  . BLADDER SUSPENSION  2001  . BREAST BIOPSY Right   . CATARACT EXTRACTION W/ INTRAOCULAR LENS IMPLANT Right ~ 08/2012  . CHEST TUBE INSERTION Left 03/08/2019   Procedure: Chest Tube Insertion;  Surgeon: Ivin Poot, MD;  Location: [place];  Service: Thoracic;  Laterality: Left;  . COLONOSCOPY  01/26/2012   Procedure: COLONOSCOPY;  Surgeon: Ladene Artist, MD,FACG;  Location: [facility] ENDOSCOPY;  Service: Endoscopy;  Laterality: N/A;  note the EGD is possible  . CORONARY ANGIOPLASTY WITH STENT PLACEMENT  2006   X 1 stent  . EPIDURAL BLOCK INJECTION    . ESOPHAGOGASTRODUODENOSCOPY  01/26/2012   Procedure: ESOPHAGOGASTRODUODENOSCOPY (EGD);  Surgeon: Ladene Artist, MD,FACG;  Location: [facility] ENDOSCOPY;  Service: Endoscopy;  Laterality: N/A;  . HEMIARTHROPLASTY HIP Right 2012  . IR EPIDUROGRAPHY  04/20/2019  . LAPAROSCOPIC CHOLECYSTECTOMY  2001  . LUMBAR FUSION  03/2013  . LUMBAR LAMINECTOMY/DECOMPRESSION MICRODISCECTOMY Right 11/04/2015   Procedure: Right Lumbar Five-Sacral One Microdiskectomy;  Surgeon: Kristeen Miss, MD;  Location: [place] NEURO ORS;  Service: Neurosurgery;  Laterality: Right;  Right L5-S1 Microdiskectomy  . moles removed that required stiches     one on leg and one on face  . SUBXYPHOID PERICARDIAL WINDOW N/A 03/08/2019   Procedure: SUBXYPHOID PERICARDIAL WINDOW;  Surgeon: Ivin Poot, MD;  Location: [place];  Service: Thoracic;  Laterality: N/A;  . TEE WITHOUT CARDIOVERSION N/A 03/08/2019   Procedure: TRANSESOPHAGEAL ECHOCARDIOGRAM (TEE);  Surgeon: Prescott Gum, Collier Salina, MD;  Location: [place];  Service: Thoracic;  Laterality: N/A;  . TEMPORAL ARTERY BIOPSY / LIGATION Bilateral 2011  . TONSILLECTOMY AND ADENOIDECTOMY     at age 64  . TUBAL LIGATION  ~ 1982  . X-STOP  IMPLANTATION  ~ 2010   "lower back" (10/28/2012)     Home Medications:  Prior to Admission medications   Medication Sig Start Date End Date Taking? Authorizing Provider  alendronate (FOSAMAX) 70 MG tablet Take 1 tablet (70 mg total) by mouth every 7 (seven) days. Take with a full glass of water on an empty stomach. 04/28/19  Yes Burchette, Alinda Sierras, MD  ALPRAZolam Duanne Moron) 0.5 MG tablet Take 0.5 mg by mouth at bedtime as needed for sleep.   Yes [provider]  Cholecalciferol (VITAMIN D-3) 25 MCG (1000 UT) CAPS Take 1,000 Units by mouth daily.   Yes [provider]  diltiazem (CARDIZEM CD) 240 MG 24 hr capsule Take 1 capsule (240 mg total) by mouth daily. 07/19/19  Yes Bhagat, Bhavinkumar, PA  ELIQUIS 5 MG TABS tablet TAKE 1 TABLET BY MOUTH TWICE A DAY Patient taking differently: Take 5 mg by mouth 2 (two) times daily.  05/15/19  Yes Burnell Blanks, MD  escitalopram (LEXAPRO) 10 MG tablet TAKE 1 TABLET BY MOUTH EVERY DAY Patient taking differently: Take 10 mg by mouth daily.  12/14/18  Yes Burchette, Alinda Sierras, MD  famotidine (PEPCID) 20 MG tablet Take 1 tablet (20 mg total) by mouth daily. Patient taking differently: Take 20 mg by mouth daily as needed.  06/02/19  Yes Eugenie Filler, MD  fentaNYL (DURAGESIC) 12 MCG/HR Place 1 patch onto the skin every 3 (three) days. 07/19/19  Yes Burchette, Alinda Sierras, MD  ferrous sulfate 325 (65 FE) MG tablet Take 1 tablet (325 mg total) by mouth 2 (two) times daily with a meal. 04/21/19  Yes Darrick Meigs, Marge Duncans, MD  furosemide (LASIX) 40 MG tablet Take 1 tablet (40 mg total) by mouth daily. 06/03/19  Yes Eugenie Filler, MD  HYDROcodone-acetaminophen [facility]) 10-325 MG tablet Take one tablet by mouth every 8 hours as needed for breakthrough pain. 09/06/19  Yes Burchette, Alinda Sierras, MD  lactobacillus acidophilus (BACID) TABS tablet Take 2 tablets by mouth 3 (three) times daily. Patient taking differently: Take 1 tablet by mouth daily.  06/01/19  Yes  Eugenie Filler, MD  metoprolol tartrate (LOPRESSOR) 100 MG tablet Take 1 tablet (100 mg total) by mouth 2 (two) times daily. 07/24/19  Yes Burnell Blanks, MD  potassium chloride SA (KLOR-CON) 10 MEQ tablet Take 1 tablet (10 mEq total) by mouth daily. 04/21/19  Yes Oswald Hillock, MD  predniSONE (DELTASONE) 10 MG tablet Take 1 tablet (10 mg total) by mouth daily. Patient taking differently: Take 10-40 mg by mouth daily. Take 40 mg by  mouth on day one 09-24-29, then take 30 mg by mouth for three days, then take 20 mg by mouth  for two days, then take 10 mg by mouth for 1 day, then start back taking 10 mg by mouth daily when taper dose is completed. Patient daughter stated her mother only starts to take the taper dose when her jaw is popping. 04/21/19  Yes Darrick Meigs, Marge Duncans, MD  vitamin C (ASCORBIC ACID) 500 MG tablet Take 1,000 mg by mouth daily.    Yes [provider]  vitamin E (VITAMIN E) 400 UNIT capsule Take 400 Units by mouth daily.   Yes [provider]  cephALEXin (KEFLEX) 500 MG capsule Take 1 capsule (500 mg total) by mouth 3 (three) times daily. Patient not taking: Reported on 09/25/2019 09/01/19   Eulas Post, MD  fentaNYL (DURAGESIC) 12 MCG/HR Apply one patch every 72 hours.  May refill in one month Patient not taking: Reported on 09/25/2019 07/19/19   Eulas Post, MD  fentaNYL (DURAGESIC) 12 MCG/HR Apply one patch every 72 hours..   May refill in two months. Patient not taking: Reported on 09/25/2019 07/19/19   Eulas Post, MD  vancomycin (VANCOCIN) 125 MG capsule Take one capsule every other day until 5/23 and then take one capsule every 3 days until prescription completed. Patient not taking: Reported on 09/25/2019 08/14/19   Eulas Post, MD    Inpatient Medications: Scheduled Meds: . diltiazem  240 mg Oral Daily  . escitalopram  10 mg Oral Daily  . fentaNYL  1 patch Transdermal Q72H  . methylPREDNISolone (SOLU-MEDROL) injection  10 mg  Intravenous Once  . metoprolol tartrate  100 mg Oral BID   Continuous Infusions: . methocarbamol (ROBAXIN) IV     PRN Meds: ALPRAZolam, HYDROcodone-acetaminophen, HYDROmorphone (DILAUDID) injection, methocarbamol **OR** methocarbamol (ROBAXIN) IV  Allergies:    Allergies  Allergen Reactions  . Dextromethorphan Rash    Social History:   Social History   Socioeconomic History  . Marital status: Married    Spouse name: Not on file  . Number of children: 3  . Years of education: Not on file  . Highest education level: Not on file  Occupational History  . Occupation: Retired Cabin crew  Tobacco Use  . Smoking status: Never Smoker  . Smokeless tobacco: Never Used  Vaping Use  . Vaping Use: Never used  Substance and Sexual Activity  . Alcohol use: Yes    Comment: socially  . Drug use: No  . Sexual activity: Not on file  Other Topics Concern  . Not on file  Social History Narrative   Lives in [place] with her husband.  Retired Cabin crew.   Social Determinants of Health   Financial Resource Strain:   . Difficulty of Paying Living Expenses:   Food Insecurity:   . Worried About Charity fundraiser in the Last Year:   . Arboriculturist in the Last Year:   Transportation Needs:   . Film/video editor (Medical):   Marland Kitchen Lack of Transportation (Non-Medical):   Physical Activity:   . Days of Exercise per Week:   . Minutes of Exercise per Session:   Stress:   . Feeling of Stress :   Social Connections:   . Frequency of Communication with Friends and Family:   . Frequency of Social Gatherings with Friends and Family:   . Attends Religious Services:   . Active Member of Clubs or Organizations:   . Attends Club  or Organization Meetings:   Marland Kitchen Marital Status:   Intimate Partner Violence:   . Fear of Current or Ex-Partner:   . Emotionally Abused:   Marland Kitchen Physically Abused:   . Sexually Abused:     Family History:    Family History  Problem Relation Age of Onset  . Brain  cancer Mother 47  . Heart attack Father   . Breast cancer Daughter 77  . Heart disease Other   . Hypertension Other   . Arthritis Neg Hx   . Colon cancer Neg Hx   . Osteoporosis Neg Hx      ROS:  Please see the history of present illness.   All other ROS reviewed and negative.     Physical Exam/Data:   Vitals:   09/25/19 2117 09/26/19 0001 09/26/19 0537 09/26/19 0805  BP:  (!) 166/63 (!) 150/66 (!) 138/53  Pulse: 75 62 64 74  Resp:  16 16 16   Temp:  98.1 F (36.7 C) 98.3 F (36.8 C) 98.3 F (36.8 C)  TempSrc:  Oral Oral Oral  SpO2:  96% 98% 93%  Weight:      Height:        Intake/Output Summary (Last 24 hours) at 09/26/2019 0933 Last data filed at 09/26/2019 0900 Gross per 24 hour  Intake 271.26 ml  Output 600 ml  Net -328.74 ml   Last 3 Weights 09/25/2019 09/04/2019 08/11/2019  Weight (lbs) 150 lb 148 lb 143 lb  Weight (kg) 68.04 kg 67.132 kg 64.864 kg     Body mass index is 25.75 kg/m.  General:  Drowsy, weak HEENT: normal Lymph: no adenopathy Neck: no JVD Endocrine:  No thryomegaly Vascular: No carotid bruits; FA pulses 2+ bilaterally without bruits  Cardiac:  normal S1, S2; RRR; no murmur  Lungs:  clear to auscultation bilaterally, no wheezing, rhonchi or rales  Abd: soft, nontender, no hepatomegaly  Ext: no edema Musculoskeletal:  R leg in splint Skin: warm and dry  Neuro:  Unable to assess, patient too drowsy Psych:  Unable to assess  EKG:  The EKG was personally reviewed and demonstrates:  NSR without significant ST-T wave changes Telemetry:  Telemetry was personally reviewed and demonstrates:  NSR without recurrent afib  Relevant CV Studies:  Echo 04/17/2019 1. Left ventricular ejection fraction, by visual estimation, is 60 to  65%. The left ventricle has normal function. There is mildly increased  left ventricular hypertrophy.  2. Left ventricular diastolic parameters are consistent with Grade II  diastolic dysfunction (pseudonormalization).  The mean left atrial pressure  is high.  3. The left ventricle has no regional wall motion abnormalities.  4. Global right ventricle has normal systolic function.The right  ventricular size is normal. No increase in right ventricular wall  thickness.  5. Left atrial size was mildly dilated.  6. Right atrial size was mildly dilated.  7. Mild mitral annular calcification.  8. The mitral valve is normal in structure. Trivial mitral valve  regurgitation.  9. The tricuspid valve is normal in structure.  10. The tricuspid valve is normal in structure. Tricuspid valve  regurgitation is mild-moderate.  11. The aortic valve is normal in structure. Aortic valve regurgitation is  trivial.  12. The pulmonic valve was not well visualized. Pulmonic valve  regurgitation is not visualized.  13. Mildly elevated pulmonary artery systolic pressure.  14. The tricuspid regurgitant velocity is 2.18 m/s, and with an assumed  right atrial pressure of 15 mmHg, the estimated right ventricular systolic  pressure is mildly elevated at 34.0 mmHg.  15. The inferior vena cava is dilated in size with <50% respiratory  variability, suggesting right atrial pressure of 15 mmHg.  16. There is exaggerated respiratory displacement of the interventricular  septum. This could represent contrictive physiology , but can also be seen  with increased work of breathing (e.g. COPD or asthma exacerbation).  83. Compared to 03/09/2019, left ventricular wall motion and overall  systolic function have improved.   Laboratory Data:  High Sensitivity Troponin:  No results for input(s): TROPONINIHS in the last 720 hours.   Chemistry Recent Labs  Lab 09/25/19 1419 09/26/19 0004  NA 141 142  K 3.4* 3.7  CL 96* 99  CO2 31 32  GLUCOSE 97 98  BUN 26* 17  CREATININE 1.01* 0.96  CALCIUM 8.7* 8.6*  GFRNONAA 51* 55*  GFRAA 60* >60  ANIONGAP 14 11    No results for input(s): PROT, ALBUMIN, AST, ALT, ALKPHOS, BILITOT in the  last 168 hours. Hematology Recent Labs  Lab 09/25/19 1419 09/26/19 0004  WBC 20.9* 14.1*  RBC 4.41 4.08  HGB 12.9 11.9*  HCT 41.1 37.8  MCV 93.2 92.6  MCH 29.3 29.2  MCHC 31.4 31.5  RDW 14.7 14.9  PLT 244 243   BNPNo results for input(s): BNP, PROBNP in the last 168 hours.  DDimer No results for input(s): DDIMER in the last 168 hours.   Radiology/Studies:  CT Knee Right Wo Contrast  Result Date: 09/25/2019 CLINICAL DATA:  Right knee pain and swelling since a fall yesterday. Distal femur fracture. EXAM: CT OF THE RIGHT KNEE WITHOUT CONTRAST TECHNIQUE: Multidetector CT imaging of the right knee was performed according to the standard protocol. Multiplanar CT image reconstructions were also generated. COMPARISON:  Radiographs dated 09/25/2019 FINDINGS: Bones/Joint/Cartilage There is an oblique fracture through the medial aspect of the distal right femoral metaphysis extending into the intercondylar notch. On the axial and coronal images there appears to be a sagittal fracture involving the periphery of the posterior aspect of femoral condyle but this is not definitive. No other fractures. Hemarthrosis. Tricompartmental osteoarthritis. Ganglion cyst extending along the popliteus tendon containing a small calcified loose body. Ligaments Posterior cruciate ligament and collateral ligaments are intact. Anterior cruciate ligament is not well enough seen for assessment. Muscles and Tendons Negative. Soft tissues Subcutaneous edema or hemorrhage around the knee to the expected degree. No definable hematoma. IMPRESSION: 1. Oblique fracture through the medial aspect of the distal right femoral metaphysis extending into the intercondylar notch. 2. Possible sagittal fracture involving the periphery of the posterior aspect of the femoral condyle but this is not definitive. 3. Hemarthrosis. 4. Tricompartmental osteoarthritis. 5. Ganglion cyst extending along the popliteus tendon containing a small calcified  loose body. Electronically Signed   By: [name] M.D.   On: 09/25/2019 14:39   CT ABDOMEN PELVIS W CONTRAST  Result Date: 09/25/2019 CLINICAL DATA:  Fall EXAM: CT ABDOMEN AND PELVIS WITH CONTRAST TECHNIQUE: Multidetector CT imaging of the abdomen and pelvis was performed using the standard protocol following bolus administration of intravenous contrast. CONTRAST:  133mL OMNIPAQUE IOHEXOL 300 MG/ML  SOLN COMPARISON:  CT 09/04/2019, 05/28/2019, 04/15/2019, 03/06/2019 FINDINGS: Lower chest: Lung bases demonstrate no acute consolidation or effusion. Mild cardiomegaly. Coronary vascular calcification. Hepatobiliary: Status post cholecystectomy. Mild intra and extrahepatic biliary dilatation as before. No focal hepatic abnormality Pancreas: Choose Spleen: Normal in size without focal abnormality. Adrenals/Urinary Tract: Adrenal glands are normal. Subcentimeter hypodensities in the left kidney too small  to further characterize. No hydronephrosis. Bladder is slightly thick walled anteriorly. Stomach/Bowel: Stomach nonenlarged. No dilated small bowel. Negative appendix. Colon diverticular disease without acute inflammatory change. Vascular/Lymphatic: Moderate aortic atherosclerosis without aneurysm. No suspicious adenopathy. Reproductive: Status post hysterectomy. No adnexal masses. Other: Negative for free air or free fluid. Small fat containing umbilical hernia. Musculoskeletal: Status post right hip replacement with artifact. Sclerosis and periosteal reaction involving the left inferior pubic ramus and ischial tuberosity corresponding to history of chronic fractures. Postsurgical changes of the lumbar spine L2 through L4. Post augmentation changes at T11 and L1. No definite acute osseous abnormality. IMPRESSION: 1. No CT evidence for acute intra-abdominal or pelvic abnormality. 2. Colon diverticular disease without acute inflammatory change. Aortic Atherosclerosis (ICD10-I70.0). Electronically Signed   By: Donavan Foil M.D.   On: 09/25/2019 17:36   DG Chest Port 1 View  Result Date: 09/25/2019 CLINICAL DATA:  Fall with femur fracture. EXAM: PORTABLE CHEST 1 VIEW COMPARISON:  06/19/2019 FINDINGS: Chronic cardiomegaly with left ventricular prominence. Chronic aortic atherosclerosis. Chronic pulmonary scarring left more than right. No sign of active infiltrate, mass, effusion or collapse. No traumatic finding of an acute nature in the region. Old augmented lower thoracic fracture. IMPRESSION: No active cardiopulmonary disease. Chronic pulmonary scarring left worse than right. Electronically Signed   By: Nelson Chimes M.D.   On: 09/25/2019 15:18   DG Knee Complete 4 Views Right  Result Date: 09/25/2019 CLINICAL DATA:  Fall, pain EXAM: RIGHT KNEE - COMPLETE 4+ VIEW COMPARISON:  None. FINDINGS: Alignment is anatomic. There is an acute fracture of the distal metaphysis of the femur involving the medial cortex. Minor displacement. There are changes of osteoarthritis with joint space narrowing and marginal osteophytes, greatest medially. Probable joint effusion. Vascular calcifications noted. IMPRESSION: Acute fracture of the medial cortex of the distal metaphysis of the right femur with minimal displacement. Probable joint effusion. Osteoarthritis. Electronically Signed   By: Macy Mis M.D.   On: 09/25/2019 12:27   DG Hip Unilat W or Wo Pelvis 2-3 Views Right  Result Date: 09/25/2019 CLINICAL DATA:  Pain following recent fall EXAM: DG HIP (WITH OR WITHOUT PELVIS) 2-3V RIGHT COMPARISON:  Aug 11, 2019. FINDINGS: Frontal pelvis as well as frontal and lateral right hip images were obtained. There is a total hip replacement on the right with prosthetic components appearing well-seated. There is a healing fracture of the left ischium with callus formation in this area and bony remodeling. There is underlying osteoporosis. No acute appearing fracture evident. No dislocation. There is moderate narrowing of the left hip  joint. There is degenerative change in the visualized lower lumbar spine with postoperative changes. There are multiple foci of arterial vascular calcification. IMPRESSION: Status post total hip replacement right with prosthetic components well-seated. No acute fracture evident.  Healing fracture left ischium. Moderate narrowing left hip joint.  No erosive change. Bones osteoporotic. Postoperative change as well as degenerative change lower lumbar spine. Electronically Signed   By: Lowella Grip III M.D.   On: 09/25/2019 12:28    Assessment and Plan:   1. Preoperative clearance:  -Patient previously had T wave inversion in the anterior leads in the setting of sepsis and hypokalemia in January 2021, Dr. Gardiner Rhyme at the time recommended outpatient Myoview as patient was largely asymptomatic.  -Myoview was deferred as patient did not have any obvious symptom.  -Since then, EKG shows T wave inversion has normalized.  She continues to have intermittent chest pain that occurs once every few  months, recently her husband passed away 4 weeks ago and she did have some chest discomfort.  -Last echocardiogram obtained in January 2021 showed EF 60 to 65%.  -She is extremely deconditioned due to recurrent hospitalization in the past several months due to sepsis and recurrence of atrial fibrillation.  -During the interview today, she was quite drowsy as she received large amount of pain medication.  At this point, proceeding with Myoview likely will not decrease her overall risk.  Given her prior history, she is likely a moderate risk patient proceeding with surgery.  2. Recurrent atrial fibrillation:   - patient has had recurrent afib in the setting of sepsis and fall  - she quite deconditioned and had multiple falls, risk of Eliquis may outweigh the benefit at this time.   3. CAD: remote stent to RCA 2005. Last cath was in 2014 showed patent stent. She has chronic intermittent chest discomfort which occurs  once every few month, this is unlikely to be cardiac in nature  4. Hypertension: BP stable  5. Hyperlipidemia  6. History of temporal arteritis: on chronic steroid therapy  7. Pericardial effusion: s/p subxiphoid window in 02/2019 by Dr. Prescott Gum      For questions or updates, please contact [facility] Please consult [email or web address] for contact info under  Signed, Almyra Deforest, [place]  09/26/2019 9:33 AM As above, patient seen and examined.  Briefly she is an 85 year old female with past medical history of coronary artery disease, hypertension, hyperlipidemia, paroxysmal atrial fibrillation, temporal arteritis, history of pericardial effusion status post pericardial window for preoperative evaluation prior to repair of femur fracture.  Note she had been on amiodarone in the past but was discontinued due to potential for pulmonary toxicity.  She has had occasional intermittent chest pain under stressful situations in the past which is not unusual.  Patient recently fell with above fracture and cardiology asked to evaluate preoperatively.  At time of evaluation she has received pain medications and is somnolent.  Her daughter assists with history.  Her husband died 4 weeks ago and she had some chest tightness that her daughter states is common under stressful situations.  Otherwise no chest pain.  There has been no dyspnea. Electrocardiogram shows sinus bradycardia with first-degree AV block and no ST changes.  1 preoperative evaluation prior to repair of femur fracture-given patient's history of recurrent atrial fibrillation and coronary disease her risk will be higher than normal.  However she has not had worsening symptoms recently and I do not think we should delay surgical repair for further cardiac work-up.  I explained to her daughter that there was some increased risk and she is willing to proceed without further cardiac evaluation.  2 history of paroxysmal atrial fibrillation-patient is in  sinus rhythm today but will be at increased risk for postoperative atrial fibrillation.  Continue Cardizem and metoprolol.  Note amiodarone because potential pulmonary toxicity in the past.  She has been on chronic apixaban.  However she has fallen 4 times in the past 2 to 3 months and I now think risk outweighs benefit.  Will discontinue apixaban long-term.  3 coronary artery disease-following surgery would add aspirin 81 mg daily.  She should also be on a statin long-term but can review with Dr. Angelena Form at follow-up office visit.  4 femur fracture-Per orthopedics.  5 hypertension-continue present medications.  Kirk Ruths, MD

## 2019-09-26 NOTE — Consult Note (Signed)
Reason for Consult:Right femur fx Referring Physician: Lorn Junes is an 85 y.o. female.  HPI: Betty Jackson was getting up off the toilet on 6/27 and fell onto her right knee. She was brought to the ED the following day where x-rays showed a distal femur fx. Orthopedic surgeon on call requested orthopedic trauma evaluation due to the location and complexity of the fracture. She was transferred to [facility] for consultation and definitive treatment. This morning she c/o severe right foot pain. She appears to have had some delirium overnight as she is in mittens this morning. She lives at home and has caregivers coming in to help her. She normally walks with a RW.  Past Medical History:  Diagnosis Date  . Acute respiratory failure with hypoxia ([place]) 05/20/2017  . Anemia, unspecified 10/28/2012  . Anxiety state, unspecified 10/28/2012  . CAD (coronary artery disease)    a. Stent RCA in [facility];  b. Cath approx 2009 - nonobs per pt report.  . Cataract    immature on the left eye  . Chronic insomnia 02/07/2013  . Chronic lower back pain    scoliosis  . CKD (chronic kidney disease) stage 3, GFR 30-59 ml/min   . Complication of anesthesia    pt has a very high tolerance to meds  . CVA (cerebral infarction) 10/29/2012  . DDD (degenerative disc disease)   . Depression   . Diverticulitis    hx of  . Diverticulosis   . Enteritis   . GERD (gastroesophageal reflux disease) 09/01/2010  . Giant cell arteritis ([place])   . Hemorrhoids   . Herniated nucleus pulposus, L5-S1, right 11/04/2015  . History of scabies   . HTN (hypertension) 05/20/2017  . Hyperlipidemia    takes Lipitor daily  . Hypertension    takes Amlodipine,Losartan,Metoprolol,and HCTZ daily  . Incontinence of urine   . Insomnia    takes Restoril nightly  . Lumbar stenosis 04/25/2013  . Major depressive disorder, recurrent episode, moderate ([place]) 07/16/2013  . Migraines    "back in my 20's; none since" (10/28/2012)  . Osteoporosis    . PAF (paroxysmal atrial fibrillation) ([place]) 2011   a. lone epidode in 2011 according to notes.  . Rheumatoid arthritis ([place])   . Scoliosis   . Sepsis ([place]) 05/2019  . Sinus bradycardia    a. on chronic bb  . Temporal arteritis ([place]) 2011   a. followed @ [facility]; potential flareup 10/28/2012/notes 10/28/2012  . Vocal cord dysfunction    "they don't operate properly" (10/28/2012)    Past Surgical History:  Procedure Laterality Date  . ABDOMINAL HYSTERECTOMY  ~ 1984   vaginally  . BACK SURGERY  7-32yrs ago   X Stop  . BLADDER SUSPENSION  2001  . BREAST BIOPSY Right   . CATARACT EXTRACTION W/ INTRAOCULAR LENS IMPLANT Right ~ 08/2012  . CHEST TUBE INSERTION Left 03/08/2019   Procedure: Chest Tube Insertion;  Surgeon: Ivin Poot, MD;  Location: [place];  Service: Thoracic;  Laterality: Left;  . COLONOSCOPY  01/26/2012   Procedure: COLONOSCOPY;  Surgeon: Ladene Artist, MD,FACG;  Location: [facility] ENDOSCOPY;  Service: Endoscopy;  Laterality: N/A;  note the EGD is possible  . CORONARY ANGIOPLASTY WITH STENT PLACEMENT  2006   X 1 stent  . EPIDURAL BLOCK INJECTION    . ESOPHAGOGASTRODUODENOSCOPY  01/26/2012   Procedure: ESOPHAGOGASTRODUODENOSCOPY (EGD);  Surgeon: Ladene Artist, MD,FACG;  Location[facility] ENDOSCOPY;  Service: Endoscopy;  Laterality: N/A;  .  HEMIARTHROPLASTY HIP Right 2012  . IR EPIDUROGRAPHY  04/20/2019  . LAPAROSCOPIC CHOLECYSTECTOMY  2001  . LUMBAR FUSION  03/2013  . LUMBAR LAMINECTOMY/DECOMPRESSION MICRODISCECTOMY Right 11/04/2015   Procedure: Right Lumbar Five-Sacral One Microdiskectomy;  Surgeon: Kristeen Miss, MD;  Location: [place] NEURO ORS;  Service: Neurosurgery;  Laterality: Right;  Right L5-S1 Microdiskectomy  . moles removed that required stiches     one on leg and one on face  . SUBXYPHOID PERICARDIAL WINDOW N/A 03/08/2019   Procedure: SUBXYPHOID PERICARDIAL WINDOW;  Surgeon: Ivin Poot, MD;  Location: [place];  Service: Thoracic;  Laterality: N/A;  . TEE WITHOUT  CARDIOVERSION N/A 03/08/2019   Procedure: TRANSESOPHAGEAL ECHOCARDIOGRAM (TEE);  Surgeon: Prescott Gum, Collier Salina, MD;  Location: [place];  Service: Thoracic;  Laterality: N/A;  . TEMPORAL ARTERY BIOPSY / LIGATION Bilateral 2011  . TONSILLECTOMY AND ADENOIDECTOMY     at age 50  . TUBAL LIGATION  ~ 1982  . X-STOP IMPLANTATION  ~ 2010   "lower back" (10/28/2012)    Family History  Problem Relation Age of Onset  . Brain cancer Mother 68  . Heart attack Father   . Breast cancer Daughter 36  . Heart disease Other   . Hypertension Other   . Arthritis Neg Hx   . Colon cancer Neg Hx   . Osteoporosis Neg Hx     Social History:  reports that she has never smoked. She has never used smokeless tobacco. She reports current alcohol use. She reports that she does not use drugs.  Allergies:  Allergies  Allergen Reactions  . Dextromethorphan Rash    Medications: I have reviewed the patient's current medications.  Results for orders placed or performed during the hospital encounter of 09/25/19 (from the past 48 hour(s))  Basic metabolic panel     Status: Abnormal   Collection Time: 09/25/19  2:19 PM  Result Value Ref Range   Sodium 141 135 - 145 mmol/L   Potassium 3.4 (L) 3.5 - 5.1 mmol/L   Chloride 96 (L) 98 - 111 mmol/L   CO2 31 22 - 32 mmol/L   Glucose, Bld 97 70 - 99 mg/dL    Comment: Glucose reference range applies only to samples taken after fasting for at least 8 hours.   BUN 26 (H) 8 - 23 mg/dL   Creatinine, Ser 1.01 (H) 0.44 - 1.00 mg/dL   Calcium 8.7 (L) 8.9 - 10.3 mg/dL   GFR calc non Af Amer 51 (L) >60 mL/min   GFR calc Af Amer 60 (L) >60 mL/min   Anion gap 14 5 - 15    Comment: Performed at [facility], [address] [place], [place] [address]  CBC with Differential     Status: Abnormal   Collection Time: 09/25/19  2:19 PM  Result Value Ref Range   WBC 20.9 (H) 4.0 - 10.5 K/uL   RBC 4.41 3.87 - 5.11 MIL/uL   Hemoglobin 12.9 12.0 - 15.0 g/dL   HCT 41.1 36 - 46 %    MCV 93.2 80.0 - 100.0 fL   MCH 29.3 26.0 - 34.0 pg   MCHC 31.4 30.0 - 36.0 g/dL   RDW 14.7 11.5 - 15.5 %   Platelets 244 150 - 400 K/uL   nRBC 0.0 0.0 - 0.2 %   Neutrophils Relative % 86 %   Neutro Abs 17.8 (H) 1.7 - 7.7 K/uL   Lymphocytes Relative 6 %   Lymphs Abs 1.3 0.7 - 4.0  K/uL   Monocytes Relative 7 %   Monocytes Absolute 1.5 (H) 0 - 1 K/uL   Eosinophils Relative 0 %   Eosinophils Absolute 0.0 0 - 0 K/uL   Basophils Relative 0 %   Basophils Absolute 0.1 0 - 0 K/uL   Immature Granulocytes 1 %   Abs Immature Granulocytes 0.22 (H) 0.00 - 0.07 K/uL    Comment: Performed at [facility], [address] [place], [place] [address]  Protime-INR     Status: None   Collection Time: 09/25/19  2:19 PM  Result Value Ref Range   Prothrombin Time 15.1 11.4 - 15.2 seconds   INR 1.2 0.8 - 1.2    Comment: (NOTE) INR goal varies based on device and disease states. Performed at [facility], [address] [place], [place] [address]   SARS Coronavirus 2 by RT PCR (hospital order, performed in [facility] hospital lab) Nasopharyngeal Nasopharyngeal Swab     Status: None   Collection Time: 09/25/19  3:11 PM   Specimen: Nasopharyngeal Swab  Result Value Ref Range   SARS Coronavirus 2 NEGATIVE NEGATIVE    Comment: (NOTE) SARS-CoV-2 target nucleic acids are NOT DETECTED.  The SARS-CoV-2 RNA is generally detectable in upper and lower respiratory specimens during the acute phase of infection. The lowest concentration of SARS-CoV-2 viral copies this assay can detect is 250 copies / mL. A negative result does not preclude SARS-CoV-2 infection and should not be used as the sole basis for treatment or other patient management decisions.  A negative result may occur with improper specimen collection / handling, submission of specimen other than nasopharyngeal swab, presence of viral mutation(s) within the areas targeted by this assay, and inadequate number of viral  copies (<250 copies / mL). A negative result must be combined with clinical observations, patient history, and epidemiological information.  Fact Sheet for Patients:   StrictlyIdeas.no  Fact Sheet for Healthcare Providers: BankingDealers.co.za  This test is not yet approved or  cleared by the [place] FDA and has been authorized for detection and/or diagnosis of SARS-CoV-2 by FDA under an Emergency Use Authorization (EUA).  This EUA will remain in effect (meaning this test can be used) for the duration of the COVID-19 declaration under Section 564(b)(1) of the Act, 21 U.S.C. section 360bbb-3(b)(1), unless the authorization is terminated or revoked sooner.  Performed at [facility], [address] [place], [place] [address]   Urinalysis, Routine w reflex microscopic     Status: Abnormal   Collection Time: 09/25/19  4:36 PM  Result Value Ref Range   Color, Urine STRAW (A) YELLOW   APPearance CLEAR CLEAR   Specific Gravity, Urine 1.015 1.005 - 1.030   pH 7.0 5.0 - 8.0   Glucose, UA NEGATIVE NEGATIVE mg/dL   Hgb urine dipstick MODERATE (A) NEGATIVE   Bilirubin Urine NEGATIVE NEGATIVE   Ketones, ur NEGATIVE NEGATIVE mg/dL   Protein, ur NEGATIVE NEGATIVE mg/dL   Nitrite NEGATIVE NEGATIVE   Leukocytes,Ua NEGATIVE NEGATIVE    Comment: Performed at [facility], [address] [place], [place] [address]  Urinalysis, Microscopic (reflex)     Status: Abnormal   Collection Time: 09/25/19  4:36 PM  Result Value Ref Range   RBC / HPF 6-10 0 - 5 RBC/hpf   WBC, UA 0-5 0 - 5 WBC/hpf   Bacteria, UA RARE (A) NONE SEEN   Squamous Epithelial / LPF 0-5 0 -  5    Comment: Performed at [facility], [address] [place], [place] [address]  Type and screen     Status: None   Collection Time: 09/25/19  8:44 PM  Result Value Ref Range   ABO/RH(D) O POS    Antibody Screen NEG    Sample Expiration       09/28/2019,2359 Performed at [facility], [place] [address], [place], [place] [address]   Glucose, capillary     Status: None   Collection Time: 09/25/19  9:28 PM  Result Value Ref Range   Glucose-Capillary 94 70 - 99 mg/dL    Comment: Glucose reference range applies only to samples taken after fasting for at least 8 hours.  CBC     Status: Abnormal   Collection Time: 09/26/19 12:04 AM  Result Value Ref Range   WBC 14.1 (H) 4.0 - 10.5 K/uL   RBC 4.08 3.87 - 5.11 MIL/uL   Hemoglobin 11.9 (L) 12.0 - 15.0 g/dL   HCT 37.8 36 - 46 %   MCV 92.6 80.0 - 100.0 fL   MCH 29.2 26.0 - 34.0 pg   MCHC 31.5 30.0 - 36.0 g/dL   RDW 14.9 11.5 - 15.5 %   Platelets 243 150 - 400 K/uL   nRBC 0.0 0.0 - 0.2 %    Comment: Performed at [facility], [place] [address] [place], [place] [address]  Basic metabolic panel     Status: Abnormal   Collection Time: 09/26/19 12:04 AM  Result Value Ref Range   Sodium 142 135 - 145 mmol/L   Potassium 3.7 3.5 - 5.1 mmol/L   Chloride 99 98 - 111 mmol/L   CO2 32 22 - 32 mmol/L   Glucose, Bld 98 70 - 99 mg/dL    Comment: Glucose reference range applies only to samples taken after fasting for at least 8 hours.   BUN 17 8 - 23 mg/dL   Creatinine, Ser 0.96 0.44 - 1.00 mg/dL   Calcium 8.6 (L) 8.9 - 10.3 mg/dL   GFR calc non Af Amer 55 (L) >60 mL/min   GFR calc Af Amer >60 >60 mL/min   Anion gap 11 5 - 15    Comment: Performed at [facility] [address], [place], [place] [address]  VITAMIN D 25 Hydroxy (Vit-D Deficiency, Fractures)     Status: Abnormal   Collection Time: 09/26/19 12:04 AM  Result Value Ref Range   Vit D, 25-Hydroxy 27.66 (L) 30 - 100 ng/mL    Comment: (NOTE) Vitamin D deficiency has been defined by the Institute of Medicine  and an Endocrine Society practice guideline as a level of serum 25-OH  vitamin D less than 20 ng/mL (1,2). The Endocrine Society went on to  further define vitamin D insufficiency as a level between 21 and 29   ng/mL (2).  1. IOM (Institute of Medicine). 2010. Dietary reference intakes for  calcium and D. [place]: The [employer]. 2. Holick MF, Binkley [place], Bischoff-Ferrari HA, et al. Evaluation,  treatment, and prevention of vitamin D deficiency: an Endocrine  Society clinical practice guideline, JCEM. 2011 Jul; 96(7): 1911-30.  Performed at [facility], [place] [address] [place],  [address]   Magnesium     Status: None   Collection Time: 09/26/19 12:04 AM  Result Value Ref Range   Magnesium 1.8 1.7 - 2.4 mg/dL    Comment: Performed at [facility], [address], [place], [place] [address]  MRSA PCR Screening     Status: None   Collection Time: 09/26/19  6:06 AM   Specimen: Nasal Mucosa; Nasopharyngeal  Result Value Ref Range   MRSA by PCR NEGATIVE NEGATIVE    Comment:        The GeneXpert MRSA Assay (FDA approved for NASAL specimens only), is one component of a comprehensive MRSA colonization surveillance program. It is not intended to diagnose MRSA infection nor to guide or monitor treatment for MRSA infections. Performed at [facility], [place] [address], [place], [place] [address]     CT Knee Right Wo Contrast  Result Date: 09/25/2019 CLINICAL DATA:  Right knee pain and swelling since a fall yesterday. Distal femur fracture. EXAM: CT OF THE RIGHT KNEE WITHOUT CONTRAST TECHNIQUE: Multidetector CT imaging of the right knee was performed according to the standard protocol. Multiplanar CT image reconstructions were also generated. COMPARISON:  Radiographs dated 09/25/2019 FINDINGS: Bones/Joint/Cartilage There is an oblique fracture through the medial aspect of the distal right femoral metaphysis extending into the intercondylar notch. On the axial and coronal images there appears to be a sagittal fracture involving the periphery of the posterior aspect of femoral condyle but this is not definitive. No other fractures. Hemarthrosis.  Tricompartmental osteoarthritis. Ganglion cyst extending along the popliteus tendon containing a small calcified loose body. Ligaments Posterior cruciate ligament and collateral ligaments are intact. Anterior cruciate ligament is not well enough seen for assessment. Muscles and Tendons Negative. Soft tissues Subcutaneous edema or hemorrhage around the knee to the expected degree. No definable hematoma. IMPRESSION: 1. Oblique fracture through the medial aspect of the distal right femoral metaphysis extending into the intercondylar notch. 2. Possible sagittal fracture involving the periphery of the posterior aspect of the femoral condyle but this is not definitive. 3. Hemarthrosis. 4. Tricompartmental osteoarthritis. 5. Ganglion cyst extending along the popliteus tendon containing a small calcified loose body. Electronically Signed   By: [name] M.D.   On: 09/25/2019 14:39   CT ABDOMEN PELVIS W CONTRAST  Result Date: 09/25/2019 CLINICAL DATA:  Fall EXAM: CT ABDOMEN AND PELVIS WITH CONTRAST TECHNIQUE: Multidetector CT imaging of the abdomen and pelvis was performed using the standard protocol following bolus administration of intravenous contrast. CONTRAST:  123mL OMNIPAQUE IOHEXOL 300 MG/ML  SOLN COMPARISON:  CT 09/04/2019, 05/28/2019, 04/15/2019, 03/06/2019 FINDINGS: Lower chest: Lung bases demonstrate no acute consolidation or effusion. Mild cardiomegaly. Coronary vascular calcification. Hepatobiliary: Status post cholecystectomy. Mild intra and extrahepatic biliary dilatation as before. No focal hepatic abnormality Pancreas: Choose Spleen: Normal in size without focal abnormality. Adrenals/Urinary Tract: Adrenal glands are normal. Subcentimeter hypodensities in the left kidney too small to further characterize. No hydronephrosis. Bladder is slightly thick walled anteriorly. Stomach/Bowel: Stomach nonenlarged. No dilated small bowel. Negative appendix. Colon diverticular disease without acute inflammatory  change. Vascular/Lymphatic: Moderate aortic atherosclerosis without aneurysm. No suspicious adenopathy. Reproductive: Status post hysterectomy. No adnexal masses. Other: Negative for free air or free fluid. Small fat containing umbilical hernia. Musculoskeletal: Status post right hip replacement with artifact. Sclerosis and periosteal reaction involving the left inferior pubic ramus and ischial tuberosity corresponding to history of chronic fractures. Postsurgical changes of the lumbar spine L2 through L4. Post augmentation changes at T11 and L1. No definite acute osseous abnormality. IMPRESSION: 1. No CT evidence for acute intra-abdominal or pelvic abnormality. 2. Colon diverticular disease without acute inflammatory change. Aortic Atherosclerosis (ICD10-I70.0). Electronically Signed   By: Madie Reno.D.  On: 09/25/2019 17:36   DG Chest Port 1 View  Result Date: 09/25/2019 CLINICAL DATA:  Fall with femur fracture. EXAM: PORTABLE CHEST 1 VIEW COMPARISON:  06/19/2019 FINDINGS: Chronic cardiomegaly with left ventricular prominence. Chronic aortic atherosclerosis. Chronic pulmonary scarring left more than right. No sign of active infiltrate, mass, effusion or collapse. No traumatic finding of an acute nature in the region. Old augmented lower thoracic fracture. IMPRESSION: No active cardiopulmonary disease. Chronic pulmonary scarring left worse than right. Electronically Signed   By: Nelson Chimes M.D.   On: 09/25/2019 15:18   DG Knee Complete 4 Views Right  Result Date: 09/25/2019 CLINICAL DATA:  Fall, pain EXAM: RIGHT KNEE - COMPLETE 4+ VIEW COMPARISON:  None. FINDINGS: Alignment is anatomic. There is an acute fracture of the distal metaphysis of the femur involving the medial cortex. Minor displacement. There are changes of osteoarthritis with joint space narrowing and marginal osteophytes, greatest medially. Probable joint effusion. Vascular calcifications noted. IMPRESSION: Acute fracture of the medial  cortex of the distal metaphysis of the right femur with minimal displacement. Probable joint effusion. Osteoarthritis. Electronically Signed   By: Macy Mis M.D.   On: 09/25/2019 12:27   DG Hip Unilat W or Wo Pelvis 2-3 Views Right  Result Date: 09/25/2019 CLINICAL DATA:  Pain following recent fall EXAM: DG HIP (WITH OR WITHOUT PELVIS) 2-3V RIGHT COMPARISON:  Aug 11, 2019. FINDINGS: Frontal pelvis as well as frontal and lateral right hip images were obtained. There is a total hip replacement on the right with prosthetic components appearing well-seated. There is a healing fracture of the left ischium with callus formation in this area and bony remodeling. There is underlying osteoporosis. No acute appearing fracture evident. No dislocation. There is moderate narrowing of the left hip joint. There is degenerative change in the visualized lower lumbar spine with postoperative changes. There are multiple foci of arterial vascular calcification. IMPRESSION: Status post total hip replacement right with prosthetic components well-seated. No acute fracture evident.  Healing fracture left ischium. Moderate narrowing left hip joint.  No erosive change. Bones osteoporotic. Postoperative change as well as degenerative change lower lumbar spine. Electronically Signed   By: Lowella Grip III M.D.   On: 09/25/2019 12:28    Review of Systems  Unable to perform ROS: Mental status change  Musculoskeletal: Positive for arthralgias (Right foot).   Blood pressure (!) 138/53, pulse 74, temperature 98.3 F (36.8 C), temperature source Oral, resp. rate 16, height 5\' 4"  (1.626 m), weight 68 kg, SpO2 93 %. Physical Exam Constitutional:      General: She is not in acute distress.    Appearance: She is well-developed. She is not diaphoretic.  HENT:     Head: Normocephalic and atraumatic.  Eyes:     General: No scleral icterus.       Right eye: No discharge.        Left eye: No discharge.     Conjunctiva/sclera:  Conjunctivae normal.  Cardiovascular:     Rate and Rhythm: Normal rate and regular rhythm.  Pulmonary:     Effort: Pulmonary effort is normal. No respiratory distress.  Musculoskeletal:     Cervical back: Normal range of motion.     Comments: RLE No traumatic wounds, ecchymosis, or rash  KI in place, mod TTP forefoot, pain with PROM foot  No ankle effusion  Knee stable to varus/ valgus and anterior/posterior stress  Sens DPN, SPN, TN intact  Motor EHL, ext, flex, evers 5/5  DP 2+,  PT 1+, No significant edema  Skin:    General: Skin is warm and dry.  Neurological:     Mental Status: She is alert.  Psychiatric:        Behavior: Behavior normal.     Assessment/Plan: Right distal femur fx -- Plan ORIF today by Dr. Marcelino Scot. Please keep NPO. Right foot pain -- Have ordered x-rays Multiple medical problems including multiple hospitalizations in the past several months for C. difficile and diverticulitis, GI bleed, hand cellulitis, history of anemia, anxiety, CAD status post stent placement, chronic low back pain, CKD, previous CVA, diverticulosis, GERD, hemorrhoids, hypertension, hyperlipidemia, PAF on anticoagulation,GCA, and RA -- per primary service    Lisette Abu, PA-C Orthopedic Surgery [phone] 09/26/2019, 9:15 AM

## 2019-09-26 NOTE — Plan of Care (Signed)

## 2019-09-26 NOTE — Anesthesia Preprocedure Evaluation (Addendum)
Anesthesia Evaluation  Patient identified by MRN, date of birth, ID band Patient awake    Reviewed: Allergy & Precautions, NPO status , Patient's Chart, lab work & pertinent test results, reviewed documented beta blocker date and time   History of Anesthesia Complications Negative for: history of anesthetic complications  Airway Mallampati: III  TM Distance: >3 FB Neck ROM: Full  Mouth opening: Limited Mouth Opening  Dental  (+) Teeth Intact, Missing, Poor Dentition,    Pulmonary  Vocal cord dysfunction per pt   Pulmonary exam normal breath sounds clear to auscultation       Cardiovascular hypertension, Pt. on medications and Pt. on home beta blockers + CAD and +CHF  Normal cardiovascular exam+ dysrhythmias Atrial Fibrillation + Valvular Problems/Murmurs (mild-mod TR)  Rhythm:Regular Rate:Normal  Echo 2021: 1. Left ventricular ejection fraction, by visual estimation, is 60 to  65%. The left ventricle has normal function. There is mildly increased  left ventricular hypertrophy.  2. Left ventricular diastolic parameters are consistent with Grade II  diastolic dysfunction (pseudonormalization). The mean left atrial pressure  is high.  3. The left ventricle has no regional wall motion abnormalities.  4. Global right ventricle has normal systolic function.The right  ventricular size is normal. No increase in right ventricular wall  thickness.  5. Left atrial size was mildly dilated.  6. Right atrial size was mildly dilated.  7. Mild mitral annular calcification.  8. The mitral valve is normal in structure. Trivial mitral valve  regurgitation.  9. The tricuspid valve is normal in structure.  10. The tricuspid valve is normal in structure. Tricuspid valve  regurgitation is mild-moderate.  11. The aortic valve is normal in structure. Aortic valve regurgitation is  trivial.  12. The pulmonic valve was not well visualized.  Pulmonic valve  regurgitation is not visualized.  13. Mildly elevated pulmonary artery systolic pressure.  14. The tricuspid regurgitant velocity is 2.18 m/s, and with an assumed  right atrial pressure of 15 mmHg, the estimated right ventricular systolic  pressure is mildly elevated at 34.0 mmHg.  15. The inferior vena cava is dilated in size with <50% respiratory  variability, suggesting right atrial pressure of 15 mmHg.  16. There is exaggerated respiratory displacement of the interventricular  septum. This could represent contrictive physiology , but can also be seen  with increased work of breathing (e.g. COPD or asthma exacerbation).  59. Compared to 03/09/2019, left ventricular wall motion and overall  systolic function have improved.     Episode of PAF 2011   Neuro/Psych  Headaches, PSYCHIATRIC DISORDERS Anxiety Depression CVA 2014- left leg is weaker CVA, No Residual Symptoms    GI/Hepatic Neg liver ROS, GERD  Medicated and Controlled,Diverticulosis    Endo/Other  negative endocrine ROS  Renal/GU Renal InsufficiencyRenal diseaseLast Cr 0.96 Bladder dysfunction  Urinary incontinence     Musculoskeletal  (+) Arthritis , Osteoarthritis,  RA, giant cell arteritis, temporal arteritis- on chronic steroids, prednisone 10mg  PO Lumbar stenosis, scoliosis, chronic LBP  Right closed distal femur fx   Abdominal Normal abdominal exam  (+)   Peds negative pediatric ROS (+)  Hematology  (+) Blood dyscrasia, anemia , hct 37.8, plt 243   Anesthesia Other Findings Very lethargic, unresponsive to questioning- per daughter, has gotten progressively worse over the last few days  Reproductive/Obstetrics negative OB ROS S/p hysterectomy                        Anesthesia Physical Anesthesia  Plan  ASA: IV  Anesthesia Plan: General   Post-op Pain Management:    Induction: Intravenous  PONV Risk Score and Plan: 3 and Ondansetron, Dexamethasone and  Treatment may vary due to age or medical condition  Airway Management Planned: Oral ETT  Additional Equipment: None  Intra-op Plan:   Post-operative Plan: Extubation in OR  Informed Consent: I have reviewed the patients History and Physical, chart, labs and discussed the procedure including the risks, benefits and alternatives for the proposed anesthesia with the patient or authorized representative who has indicated his/her understanding and acceptance.   Patient has DNR.  Suspend DNR.   Dental advisory given  Plan Discussed with: CRNA  Anesthesia Plan Comments: (Stress dose steroids for chronic steroid use D/w daughter DNR- decision to treat patient medically but no chest compressions. )      Anesthesia Quick Evaluation

## 2019-09-26 NOTE — Plan of Care (Signed)

## 2019-09-26 NOTE — Progress Notes (Signed)
SLP Cancellation Note  Patient Details Name: Betty Jackson MRN: [id number] DOB: [birth year]   Cancelled treatment:       Reason Eval/Treat Not Completed: Medical issues which prohibited therapy. Per chart, pt is NPO this morning for potential surgery today. Will f/u for swallow evaluation as able once she is able to resume POs.    Osie Bond., M.A. [facility] Pager [phone] Office [phone]  09/26/2019, 7:52 AM

## 2019-09-26 NOTE — TOC Initial Note (Signed)
Transition of Care [facility]) - Initial/Assessment Note    Patient Details  Name: Betty Jackson MRN: [id number] Date of Birth: [birth year]  [facility] CM/SW Contact:    Curlene Labrum, RN Phone Number: 09/26/2019, 4:36 PM  Clinical Narrative:                 Case management met with the patient and daughter regarding transitions of care S/P Right femur fracture after mechanical fall at home.  The patient is being cared for at home with private pay aids and [facility] for PT, RN.  The daughter and patient would like the patient to discharge home with [facility].  [facility] was made aware that the patient will continue services when she discharges home.  The patient currently is being followed by [facility].  The patient's daughter states that the patient will need a hospital bed and possible WC for home.  Will continue to follow the patient - especially after PT evaluation today.  Expected Discharge Plan: [place] Barriers to Discharge: Continued Medical Work up   Patient Goals and CMS Choice Patient states their goals for this hospitalization and ongoing recovery are:: Patient and daughter want the patient to get the best of care and get home health coordinated before discharging home. CMS Medicare.gov Compare Post Acute Care list provided to:: Patient Represenative (must comment) (daughter) Choice offered to / list presented to : [name]  Expected Discharge Plan and Services Expected Discharge Plan: [place]   Discharge Planning Services: CM Consult Post Acute Care Choice: [place] arrangements for the past 2 months: Apartment                 DME Arranged:  (PT to evaluate for equipment needs) DME Agency: [place] Agency: [place] ([place]) Date [place]: 09/26/19 Time [place]: [place] Representative spoke with at [place]: [name],  [name]  Prior Living Arrangements/Services Living arrangements for the past 2 months: Apartment Lives with:: Other (Comment) (lives with caregivers) Patient language and need for interpreter reviewed:: Yes Do you feel safe going back to the place where you live?: Yes      Need for Family Participation in Patient Care: Yes (Comment) Care giver support system in place?: Yes (comment) Current home services: Home PT Criminal Activity/Legal Involvement Pertinent to Current Situation/Hospitalization: No - Comment as needed  Activities of Daily Living Home Assistive Devices/Equipment: Wheelchair ADL Screening (condition at time of admission) Patient's cognitive ability adequate to safely complete daily activities?: Yes Is the patient deaf or have difficulty hearing?: No Does the patient have difficulty seeing, even when wearing glasses/contacts?: No Does the patient have difficulty concentrating, remembering, or making decisions?: Yes Patient able to express need for assistance with ADLs?: Yes Does the patient have difficulty dressing or bathing?: Yes Independently performs ADLs?: No Does the patient have difficulty walking or climbing stairs?: Yes Weakness of Legs: Both Weakness of Arms/Hands: None  Permission Sought/Granted Permission sought to share information with : Case Manager Permission granted to share information with : Yes, Verbal Permission Granted        Permission granted to share info w Relationship: daughter, [name]     Emotional Assessment Appearance:: Appears stated age Attitude/Demeanor/Rapport: Gracious Affect (typically observed): Accepting Orientation: : Oriented to Self, Oriented to Place, Oriented to  Time, Oriented to Situation Alcohol /  Substance Use: Not Applicable Psych Involvement: No (comment)  Admission diagnosis:  Femur fracture, right ([place]) [S72.91XA] Closed fracture of distal end of right femur, unspecified fracture morphology, initial encounter  [facility]) [S72.401A] Patient Active Problem List   Diagnosis Date Noted  . Femur fracture, right ([place]) 09/25/2019  . Chronic diastolic CHF (congestive heart failure) ([place]) 09/25/2019  . HCAP (healthcare-associated pneumonia) 06/19/2019  . Encephalopathy 06/19/2019  . Lumbar degenerative disc disease   . Paroxysmal A-fib ([place])   . Hypomagnesemia   . C. difficile colitis   . Diarrhea 05/29/2019  . CHF (congestive heart failure) ([place]) 05/29/2019  . Recurrent falls 04/30/2019  . Pericardial effusion without cardiac tamponade 03/29/2019  . Hypoalbuminemia 03/27/2019  . Cardiac tamponade   . Pericarditis 03/06/2019  . Fever   . Septic shock ([place])   . Atypical chest pain   . Atrial fibrillation with rapid ventricular response ([place])   . Nausea 01/17/2019  . Acute diverticulitis 01/13/2019  . AKI (acute kidney injury) ([place]) 01/13/2019  . Chronic back pain 01/13/2019  . Tachycardia 01/13/2019  . Hypokalemia 01/13/2019  . Chronic low back pain 07/10/2018  . Influenza A 05/20/2017  . Sepsis ([place]) 05/20/2017  . Acute respiratory failure with hypoxia ([place]) 05/20/2017  . CKD (chronic kidney disease), stage III 05/20/2017  . PAF (paroxysmal atrial fibrillation) ([place]) 05/20/2017  . HTN (hypertension) 05/20/2017  . Giant cell arteritis ([place]) 05/20/2017  . Coronary disease 05/20/2017  . Abnormal urinalysis 05/20/2017  . Osteoporosis 05/08/2016  . Herniated nucleus pulposus, L5-S1, right 11/04/2015  . Major depressive disorder, recurrent episode, moderate ([place]) 07/16/2013  . Abdominal pain, epigastric 06/22/2013  . Lumbar stenosis 04/25/2013  . Chronic insomnia 02/07/2013  . Elevated transaminase level 02/07/2013  . CVA (cerebral infarction) 10/29/2012  . Visual changes 10/28/2012  . Depression 10/28/2012  . Anxiety state 10/28/2012  . Anemia, unspecified 10/28/2012  . Nonspecific (abnormal) findings on radiological and other examination of gastrointestinal tract 01/25/2012  . Hyponatremia  01/24/2012  . Hematuria 01/24/2012  . Enteritis 12/24/2011  . Abdominal pain, other specified site 12/24/2011  . Leg pain, right 02/18/2011  . Temporal arteritis ([place]) 09/01/2010  . Hypertension 09/01/2010  . Hyperlipidemia 09/01/2010  . History of atrial fibrillation 09/01/2010  . CAD (coronary artery disease) 09/01/2010  . GERD (gastroesophageal reflux disease) 09/01/2010  . Insomnia 09/01/2010   PCP:  Eulas Post, MD Pharmacy:   [id number] [place] [id number]: [phone]: [phone]    Social Determinants of Health (SDOH) Interventions    Readmission Risk Interventions Readmission Risk Prevention Plan 09/26/2019 06/21/2019 06/01/2019  Transportation Screening Complete Complete Complete  Medication Review (Press photographer Complete Complete Complete  PCP or Specialist appointment within 3-5 days of discharge Complete Not Complete Complete  PCP/Specialist Appt Not Complete comments - likely SNF placement -  [facility] [place] Home Care Consult Complete Not Complete Complete  HRI or Home Care Consult Pt Refusal Comments - likely SNF -  SW Recovery Care/Counseling Consult Complete Complete Complete  Palliative Care Screening Complete Complete -  [place] Complete Not Applicable  Some recent data might be hidden

## 2019-09-26 NOTE — Progress Notes (Signed)
PROGRESS NOTE  Betty Jackson BZJ:[id number] DOB: Mar 05, 1935 DOA: 09/25/2019 PCP: Eulas Post, MD   LOS: 1 day   Brief Narrative / Interim history: 85 year old female with history of C. difficile, diverticulitis, GI bleed, CAD with prior stenting, prior CVA, CKD, hypertension, hyperlipidemia, paroxysmal A. fib on anticoagulation, rheumatoid arthritis, giant cell arteritis came into the hospital with a mechanical fall as she was going to the bathroom, this was followed by pain.  She underwent imaging which showed oblique fracture through the medial aspect of the distal right femoral meta physis.  The right hip prosthesis is well-seated without any fractures.  Orthopedic surgery was consulted and we are asked to admit  Subjective / 24h Interval events: Had some delirium overnight, she has mittens on this morning and appears slightly confused.  Denies any pain for me.  Assessment & Plan: Principal Problem Right femur fracture-orthopedic surgery consulted, plan for operative repair today.  Cardiology has been consulted prior to surgery.  Appreciate input.  DVT prophylaxis, pain management per orthopedic surgery, PT to evaluate postop. May need SNF.  Active Problems Paroxysmal atrial fibrillation-Eliquis has now been on hold given need for surgery.  EKG on admission shows sinus rhythm, however she is definitely at high risk for developing A. fib with RVR in a postoperative setting.  Cardiology follow-up as well.  Resume Eliquis when deemed safe by orthopedic surgery.  She was briefly on amiodarone however that has been discontinued at the beginning of this year due to concern for pneumonitis.  Continue metoprolol and diltiazem for rate control.  Chronic diastolic CHF-most recent EF was normal in January 2021, had grade 2 diastolic dysfunction.  Monitor fluid status.  Chronic kidney disease stage II -creatinine around 1, currently at baseline.  Avoid hypotension perioperatively  Coronary artery  disease-appreciate cardiology input, continue beta-blocker, Eliquis on hold for now  History of temporal arteritis / RA -has been on steroid taper, continue prednisone  History of pericardial effusion-this was in December 2020, she underwent subxiphoid pericardial window by cardiothoracic surgery.  This was believed to be due to viral pericarditis.  History of diverticulitis/C. difficile-no recurrent issues right now, monitor for diarrhea.  History of anxiety, depression-continue home medications  Chronic normocytic anemia-monitor H&H following surgery  Scheduled Meds: . acetaminophen  1,000 mg Oral Once  . chlorhexidine  60 mL Topical Once  . chlorhexidine  15 mL Mouth/Throat Once  . chlorhexidine      . [MAR Hold] diltiazem  240 mg Oral Daily  . [MAR Hold] escitalopram  10 mg Oral Daily  . [MAR Hold] fentaNYL  1 patch Transdermal Q72H  . [MAR Hold] methylPREDNISolone (SOLU-MEDROL) injection  10 mg Intravenous Once  . [MAR Hold] metoprolol tartrate  100 mg Oral BID  . povidone-iodine  2 application Topical Once   Continuous Infusions: . ceFAZolin    .  ceFAZolin (ANCEF) IV    . lactated ringers    . [MAR Hold] methocarbamol (ROBAXIN) IV     PRN Meds:.[MAR Hold] ALPRAZolam, [MAR Hold] HYDROcodone-acetaminophen, [MAR Hold]  HYDROmorphone (DILAUDID) injection, [MAR Hold] methocarbamol **OR** [MAR Hold] methocarbamol (ROBAXIN) IV  Diet Orders (From admission, onward)    Start     Ordered   09/26/19 0925  Diet NPO time specified  Diet effective now        09/26/19 0175          DVT prophylaxis: SCDs Start: 09/25/19 2028     Code Status: DNR  Family Communication: no family at bedside  Status is: Inpatient  Remains inpatient appropriate because:Inpatient level of care appropriate due to severity of illness   Dispo: The patient is from: Home              Anticipated d/c is to: SNF              Anticipated d/c date is: 3 days              Patient currently is not  medically stable to d/c.  Consultants:  Orthopedic surgery  Cardiology   Procedures:  none  Microbiology  none  Antimicrobials: none    Objective: Vitals:   09/26/19 0001 09/26/19 0537 09/26/19 0805 09/26/19 1058  BP: (!) 166/63 (!) 150/66 (!) 138/53   Pulse: 62 64 74   Resp: 16 16 16    Temp: 98.1 F (36.7 C) 98.3 F (36.8 C) 98.3 F (36.8 C)   TempSrc: Oral Oral Oral   SpO2: 96% 98% 93%   Weight:    68 kg  Height:    5' 4.02" (1.626 m)    Intake/Output Summary (Last 24 hours) at 09/26/2019 1139 Last data filed at 09/26/2019 0900 Gross per 24 hour  Intake 271.26 ml  Output 600 ml  Net -328.74 ml   Filed Weights   09/25/19 1113 09/26/19 1058  Weight: 68 kg 68 kg    Examination:  Constitutional: NAD Eyes: no scleral icterus ENMT: Mucous membranes are moist.  Neck: normal, supple Respiratory: clear to auscultation bilaterally, no wheezing, no crackles. Normal respiratory effort. No accessory muscle use.  Cardiovascular: Regular rate and rhythm. No LE edema. Good peripheral pulses Abdomen: non distended, no tenderness. Bowel sounds positive.  Musculoskeletal: no clubbing / cyanosis.  Skin: no rashes Neurologic: non focal  Data Reviewed: I have independently reviewed following labs and imaging studies   CBC: Recent Labs  Lab 09/25/19 1419 09/26/19 0004  WBC 20.9* 14.1*  NEUTROABS 17.8*  --   HGB 12.9 11.9*  HCT 41.1 37.8  MCV 93.2 92.6  PLT 244 630   Basic Metabolic Panel: Recent Labs  Lab 09/25/19 1419 09/26/19 0004  NA 141 142  K 3.4* 3.7  CL 96* 99  CO2 31 32  GLUCOSE 97 98  BUN 26* 17  CREATININE 1.01* 0.96  CALCIUM 8.7* 8.6*  MG  --  1.8   Liver Function Tests: No results for input(s): AST, ALT, ALKPHOS, BILITOT, PROT, ALBUMIN in the last 168 hours. Coagulation Profile: Recent Labs  Lab 09/25/19 1419  INR 1.2   HbA1C: No results for input(s): HGBA1C in the last 72 hours. CBG: Recent Labs  Lab 09/25/19 2128  GLUCAP 94     Recent Results (from the past 240 hour(s))  SARS Coronavirus 2 by RT PCR (hospital order, performed in [facility] hospital lab) Nasopharyngeal Nasopharyngeal Swab     Status: None   Collection Time: 09/25/19  3:11 PM   Specimen: Nasopharyngeal Swab  Result Value Ref Range Status   SARS Coronavirus 2 NEGATIVE NEGATIVE Final    Comment: (NOTE) SARS-CoV-2 target nucleic acids are NOT DETECTED.  The SARS-CoV-2 RNA is generally detectable in upper and lower respiratory specimens during the acute phase of infection. The lowest concentration of SARS-CoV-2 viral copies this assay can detect is 250 copies / mL. A negative result does not preclude SARS-CoV-2 infection and should not be used as the sole basis for treatment or other patient management decisions.  A negative result may occur with improper specimen collection / handling, submission  of specimen other than nasopharyngeal swab, presence of viral mutation(s) within the areas targeted by this assay, and inadequate number of viral copies (<250 copies / mL). A negative result must be combined with clinical observations, patient history, and epidemiological information.  Fact Sheet for Patients:   StrictlyIdeas.no  Fact Sheet for Healthcare Providers: BankingDealers.co.za  This test is not yet approved or  cleared by the [place] FDA and has been authorized for detection and/or diagnosis of SARS-CoV-2 by FDA under an Emergency Use Authorization (EUA).  This EUA will remain in effect (meaning this test can be used) for the duration of the COVID-19 declaration under Section 564(b)(1) of the Act, 21 U.S.C. section 360bbb-3(b)(1), unless the authorization is terminated or revoked sooner.  Performed at [facility], [address] [place], [place] [address]   MRSA PCR Screening     Status: None   Collection Time: 09/26/19  6:06 AM   Specimen: Nasal Mucosa;  Nasopharyngeal  Result Value Ref Range Status   MRSA by PCR NEGATIVE NEGATIVE Final    Comment:        The GeneXpert MRSA Assay (FDA approved for NASAL specimens only), is one component of a comprehensive MRSA colonization surveillance program. It is not intended to diagnose MRSA infection nor to guide or monitor treatment for MRSA infections. Performed at [facility], [place] [address] [place], [place] [address]      Radiology Studies: CT Knee Right Wo Contrast  Result Date: 09/25/2019 CLINICAL DATA:  Right knee pain and swelling since a fall yesterday. Distal femur fracture. EXAM: CT OF THE RIGHT KNEE WITHOUT CONTRAST TECHNIQUE: Multidetector CT imaging of the right knee was performed according to the standard protocol. Multiplanar CT image reconstructions were also generated. COMPARISON:  Radiographs dated 09/25/2019 FINDINGS: Bones/Joint/Cartilage There is an oblique fracture through the medial aspect of the distal right femoral metaphysis extending into the intercondylar notch. On the axial and coronal images there appears to be a sagittal fracture involving the periphery of the posterior aspect of femoral condyle but this is not definitive. No other fractures. Hemarthrosis. Tricompartmental osteoarthritis. Ganglion cyst extending along the popliteus tendon containing a small calcified loose body. Ligaments Posterior cruciate ligament and collateral ligaments are intact. Anterior cruciate ligament is not well enough seen for assessment. Muscles and Tendons Negative. Soft tissues Subcutaneous edema or hemorrhage around the knee to the expected degree. No definable hematoma. IMPRESSION: 1. Oblique fracture through the medial aspect of the distal right femoral metaphysis extending into the intercondylar notch. 2. Possible sagittal fracture involving the periphery of the posterior aspect of the femoral condyle but this is not definitive. 3. Hemarthrosis. 4. Tricompartmental osteoarthritis.  5. Ganglion cyst extending along the popliteus tendon containing a small calcified loose body. Electronically Signed   By: [name] M.D.   On: 09/25/2019 14:39   CT ABDOMEN PELVIS W CONTRAST  Result Date: 09/25/2019 CLINICAL DATA:  Fall EXAM: CT ABDOMEN AND PELVIS WITH CONTRAST TECHNIQUE: Multidetector CT imaging of the abdomen and pelvis was performed using the standard protocol following bolus administration of intravenous contrast. CONTRAST:  169mL OMNIPAQUE IOHEXOL 300 MG/ML  SOLN COMPARISON:  CT 09/04/2019, 05/28/2019, 04/15/2019, 03/06/2019 FINDINGS: Lower chest: Lung bases demonstrate no acute consolidation or effusion. Mild cardiomegaly. Coronary vascular calcification. Hepatobiliary: Status post cholecystectomy. Mild intra and extrahepatic biliary dilatation as before. No focal hepatic abnormality Pancreas: Choose Spleen: Normal in size without focal abnormality. Adrenals/Urinary Tract: Adrenal glands are normal. Subcentimeter hypodensities in the  left kidney too small to further characterize. No hydronephrosis. Bladder is slightly thick walled anteriorly. Stomach/Bowel: Stomach nonenlarged. No dilated small bowel. Negative appendix. Colon diverticular disease without acute inflammatory change. Vascular/Lymphatic: Moderate aortic atherosclerosis without aneurysm. No suspicious adenopathy. Reproductive: Status post hysterectomy. No adnexal masses. Other: Negative for free air or free fluid. Small fat containing umbilical hernia. Musculoskeletal: Status post right hip replacement with artifact. Sclerosis and periosteal reaction involving the left inferior pubic ramus and ischial tuberosity corresponding to history of chronic fractures. Postsurgical changes of the lumbar spine L2 through L4. Post augmentation changes at T11 and L1. No definite acute osseous abnormality. IMPRESSION: 1. No CT evidence for acute intra-abdominal or pelvic abnormality. 2. Colon diverticular disease without acute  inflammatory change. Aortic Atherosclerosis (ICD10-I70.0). Electronically Signed   By: Donavan Foil M.D.   On: 09/25/2019 17:36   DG Chest Port 1 View  Result Date: 09/25/2019 CLINICAL DATA:  Fall with femur fracture. EXAM: PORTABLE CHEST 1 VIEW COMPARISON:  06/19/2019 FINDINGS: Chronic cardiomegaly with left ventricular prominence. Chronic aortic atherosclerosis. Chronic pulmonary scarring left more than right. No sign of active infiltrate, mass, effusion or collapse. No traumatic finding of an acute nature in the region. Old augmented lower thoracic fracture. IMPRESSION: No active cardiopulmonary disease. Chronic pulmonary scarring left worse than right. Electronically Signed   By: Nelson Chimes M.D.   On: 09/25/2019 15:18   DG Knee Complete 4 Views Right  Result Date: 09/25/2019 CLINICAL DATA:  Fall, pain EXAM: RIGHT KNEE - COMPLETE 4+ VIEW COMPARISON:  None. FINDINGS: Alignment is anatomic. There is an acute fracture of the distal metaphysis of the femur involving the medial cortex. Minor displacement. There are changes of osteoarthritis with joint space narrowing and marginal osteophytes, greatest medially. Probable joint effusion. Vascular calcifications noted. IMPRESSION: Acute fracture of the medial cortex of the distal metaphysis of the right femur with minimal displacement. Probable joint effusion. Osteoarthritis. Electronically Signed   By: Macy Mis M.D.   On: 09/25/2019 12:27   DG Foot Complete Right  Result Date: 09/26/2019 CLINICAL DATA:  85 year old female with fall and right knee fracture. Right foot pain. EXAM: RIGHT FOOT COMPLETE - 3+ VIEW COMPARISON:  None. FINDINGS: Portable views including cross-table lateral. Calcaneus and tarsal bones appear intact. Metatarsals appear intact with mild to moderate 1st MTP degeneration and mild hallux valgus. No phalanx fracture identified. Calcified peripheral vascular disease but no discrete soft tissue injury. IMPRESSION: No acute fracture  or dislocation identified about the right foot. Electronically Signed   By: Genevie Ann M.D.   On: 09/26/2019 10:08   DG Hip Unilat W or Wo Pelvis 2-3 Views Right  Result Date: 09/25/2019 CLINICAL DATA:  Pain following recent fall EXAM: DG HIP (WITH OR WITHOUT PELVIS) 2-3V RIGHT COMPARISON:  Aug 11, 2019. FINDINGS: Frontal pelvis as well as frontal and lateral right hip images were obtained. There is a total hip replacement on the right with prosthetic components appearing well-seated. There is a healing fracture of the left ischium with callus formation in this area and bony remodeling. There is underlying osteoporosis. No acute appearing fracture evident. No dislocation. There is moderate narrowing of the left hip joint. There is degenerative change in the visualized lower lumbar spine with postoperative changes. There are multiple foci of arterial vascular calcification. IMPRESSION: Status post total hip replacement right with prosthetic components well-seated. No acute fracture evident.  Healing fracture left ischium. Moderate narrowing left hip joint.  No erosive change. Bones osteoporotic. Postoperative change  as well as degenerative change lower lumbar spine. Electronically Signed   By: Lowella Grip III M.D.   On: 09/25/2019 12:28    Marzetta Board, MD, PhD [facility]  Between 7 am - 7 pm I am available, please contact me via Amion or Securechat  Between 7 pm - 7 am I am not available, please contact night coverage MD/APP via Amion

## 2019-09-26 NOTE — Anesthesia Procedure Notes (Signed)
Procedure Name: Intubation Date/Time: 09/26/2019 12:17 PM Performed by: Malachi Paradise, RN Pre-anesthesia Checklist: Patient identified, Emergency Drugs available, Suction available and Patient being monitored Patient Re-evaluated:Patient Re-evaluated prior to induction Oxygen Delivery Method: Circle System Utilized Preoxygenation: Pre-oxygenation with 100% oxygen Induction Type: IV induction Ventilation: Mask ventilation without difficulty Laryngoscope Size: Mac and 3 Grade View: Grade I Tube type: Oral Tube size: 7.0 mm Number of attempts: 1 Airway Equipment and Method: Stylet and Oral airway Placement Confirmation: ETT inserted through vocal cords under direct vision,  positive ETCO2 and breath sounds checked- equal and bilateral Secured at: 20 cm Tube secured with: Tape Dental Injury: Teeth and Oropharynx as per pre-operative assessment  Comments: Intubation by Malachi Paradise SRNA

## 2019-09-26 NOTE — Anesthesia Postprocedure Evaluation (Signed)
Anesthesia Post Note  Patient: Charla L Hem  Procedure(s) Performed: OPEN REDUCTION INTERNAL FIXATION (ORIF) DISTAL FEMUR FRACTURE (Right Leg Upper) FINE NEEDLE ASPIRATION (Right Knee)     Patient location during evaluation: PACU Anesthesia Type: General Level of consciousness: awake and alert, oriented and patient cooperative Pain management: pain level controlled Vital Signs Assessment: post-procedure vital signs reviewed and stable Respiratory status: spontaneous breathing, nonlabored ventilation and respiratory function stable Cardiovascular status: blood pressure returned to baseline and stable Postop Assessment: no apparent nausea or vomiting Anesthetic complications: no   No complications documented.  Last Vitals:  Vitals:   09/26/19 1445 09/26/19 1532  BP: (!) 140/54 (!) 138/56  Pulse: 69 70  Resp: 14 16  Temp: 36.6 C 36.6 C  SpO2: 93% 96%    Last Pain:  Vitals:   09/26/19 1532  TempSrc: Oral  PainSc:                  Pervis Hocking

## 2019-09-27 ENCOUNTER — Encounter (HOSPITAL_COMMUNITY): Payer: Self-pay | Admitting: Orthopedic Surgery

## 2019-09-27 ENCOUNTER — Inpatient Hospital Stay (HOSPITAL_COMMUNITY): Payer: Medicare Other

## 2019-09-27 DIAGNOSIS — I5032 Chronic diastolic (congestive) heart failure: Secondary | ICD-10-CM

## 2019-09-27 DIAGNOSIS — I2510 Atherosclerotic heart disease of native coronary artery without angina pectoris: Secondary | ICD-10-CM

## 2019-09-27 LAB — COMPREHENSIVE METABOLIC PANEL
ALT: 19 U/L (ref 0–44)
AST: 18 U/L (ref 15–41)
Albumin: 3 g/dL — ABNORMAL LOW (ref 3.5–5.0)
Alkaline Phosphatase: 74 U/L (ref 38–126)
Anion gap: 12 (ref 5–15)
BUN: 17 mg/dL (ref 8–23)
CO2: 30 mmol/L (ref 22–32)
Calcium: 8.6 mg/dL — ABNORMAL LOW (ref 8.9–10.3)
Chloride: 102 mmol/L (ref 98–111)
Creatinine, Ser: 1.19 mg/dL — ABNORMAL HIGH (ref 0.44–1.00)
GFR calc Af Amer: 49 mL/min — ABNORMAL LOW (ref >60)
GFR calc non Af Amer: 42 mL/min — ABNORMAL LOW (ref >60)
Glucose, Bld: 146 mg/dL — ABNORMAL HIGH (ref 70–99)
Potassium: 4.6 mmol/L (ref 3.5–5.1)
Sodium: 144 mmol/L (ref 135–145)
Total Bilirubin: 0.9 mg/dL (ref 0.3–1.2)
Total Protein: 6 g/dL — ABNORMAL LOW (ref 6.5–8.1)

## 2019-09-27 LAB — CBC
HCT: 34.2 % — ABNORMAL LOW (ref 36.0–46.0)
Hemoglobin: 10.6 g/dL — ABNORMAL LOW (ref 12.0–15.0)
MCH: 29.2 pg (ref 26.0–34.0)
MCHC: 31 g/dL (ref 30.0–36.0)
MCV: 94.2 fL (ref 80.0–100.0)
Platelets: 210 10*3/uL (ref 150–400)
RBC: 3.63 MIL/uL — ABNORMAL LOW (ref 3.87–5.11)
RDW: 14.5 % (ref 11.5–15.5)
WBC: 21.8 10*3/uL — ABNORMAL HIGH (ref 4.0–10.5)
nRBC: 0 % (ref 0.0–0.2)

## 2019-09-27 MED ORDER — PHENYLEPHRINE HCL-NACL 10-0.9 MG/250ML-% IV SOLN
INTRAVENOUS | Status: AC
  Filled 2019-09-27: qty 500

## 2019-09-27 MED ORDER — ENOXAPARIN SODIUM 30 MG/0.3ML ~~LOC~~ SOLN
30.0000 mg | SUBCUTANEOUS | Status: DC
  Administered 2019-09-27 – 2019-09-28 (×2): 30 mg via SUBCUTANEOUS
  Filled 2019-09-27 (×2): qty 0.3

## 2019-09-27 MED ORDER — CEFAZOLIN SODIUM-DEXTROSE 1-4 GM/50ML-% IV SOLN
1.0000 g | Freq: Three times a day (TID) | INTRAVENOUS | Status: AC
  Administered 2019-09-27 (×2): 1 g via INTRAVENOUS
  Filled 2019-09-27 (×2): qty 50

## 2019-09-27 MED ORDER — VITAMIN D 25 MCG (1000 UNIT) PO TABS
2000.0000 [IU] | ORAL_TABLET | Freq: Two times a day (BID) | ORAL | Status: DC
  Administered 2019-09-27 – 2019-09-29 (×4): 2000 [IU] via ORAL
  Filled 2019-09-27 (×4): qty 2

## 2019-09-27 MED ORDER — PREDNISONE 20 MG PO TABS
30.0000 mg | ORAL_TABLET | Freq: Every day | ORAL | Status: DC
  Administered 2019-09-27 – 2019-09-28 (×2): 30 mg via ORAL
  Filled 2019-09-27 (×2): qty 1

## 2019-09-27 MED ORDER — ASCORBIC ACID 500 MG PO TABS
500.0000 mg | ORAL_TABLET | Freq: Every day | ORAL | Status: DC
  Administered 2019-09-27 – 2019-09-29 (×3): 500 mg via ORAL
  Filled 2019-09-27 (×3): qty 1

## 2019-09-27 NOTE — Progress Notes (Signed)
[facility]--The home-based [facility]iative [facility] Division of [facility]--Pt is active with [facility] services.   Will continue to follow hospital course and can assist with d/c as needed.  Thank you Wynetta Fines, RN [phone];  Mobile [phone]

## 2019-09-27 NOTE — Progress Notes (Addendum)
Orthopaedic Trauma Service Progress Note  Patient ID: Betty Jackson MRN: [id number] DOB/AGE: [birth year] 85 y.o.  Subjective:  Doing ok  Pain tolerable Has not worked with therapy yet  Lives alone but has caregiver per daughters report Single level home  Hopeful to return home after hospital stay   Pt has been using wheelchair for a while now, occasionally will use the walker.  L leg is very weak   On fosamax PTA   Would recommend permanent discontinuation of this medication for several reasons   1. Appears not effective in this patient as she has sustained a low energy fracture   2. We do not recommend continuation of this medication during the fracture healing phase due to its MOA   3. Studies show a possible correlation between Bisphosphonate use and development of A. Fib   ROS As above  Objective:   VITALS:   Vitals:   09/26/19 1532 09/26/19 2012 09/27/19 0342 09/27/19 0808  BP: (!) 138/56 (!) 131/55 (!) 128/59 (!) 150/92  Pulse: 70 63 (!) 53 (!) 59  Resp: 16 16 18 16   Temp: 97.9 F (36.6 C) 98 F (36.7 C) (!) 97.5 F (36.4 C) 97.7 F (36.5 C)  TempSrc: Oral Oral Oral Oral  SpO2: 96% 95% 100% 100%  Weight:   65.3 kg   Height:        Estimated body mass index is 24.7 kg/m as calculated from the following:   Height as of this encounter: 5' 4.02" (1.626 m).   Weight as of this encounter: 65.3 kg.   Intake/Output      06/29 0701 - 06/30 0700 06/30 0701 - 07/01 0700   P.O. 480 240   I.V. (mL/kg) 900 (13.8)    IV Piggyback 250    Total Intake(mL/kg) 1630 (25) 240 (3.7)   Urine (mL/kg/hr) 300 (0.2)    Stool 0    Blood 25    Total Output 325    Net +1305 +240        Urine Occurrence  2 x   Stool Occurrence 1 x 1 x     LABS  Results for orders placed or performed during the hospital encounter of 09/25/19 (from the past 24 hour(s))  Comprehensive metabolic panel     Status:  Abnormal   Collection Time: 09/27/19  8:12 AM  Result Value Ref Range   Sodium 144 135 - 145 mmol/L   Potassium 4.6 3.5 - 5.1 mmol/L   Chloride 102 98 - 111 mmol/L   CO2 30 22 - 32 mmol/L   Glucose, Bld 146 (H) 70 - 99 mg/dL   BUN 17 8 - 23 mg/dL   Creatinine, Ser 1.19 (H) 0.44 - 1.00 mg/dL   Calcium 8.6 (L) 8.9 - 10.3 mg/dL   Total Protein 6.0 (L) 6.5 - 8.1 g/dL   Albumin 3.0 (L) 3.5 - 5.0 g/dL   AST 18 15 - 41 U/L   ALT 19 0 - 44 U/L   Alkaline Phosphatase 74 38 - 126 U/L   Total Bilirubin 0.9 0.3 - 1.2 mg/dL   GFR calc non Af Amer 42 (L) >60 mL/min   GFR calc Af Amer 49 (L) >60 mL/min   Anion gap 12 5 - 15  CBC     Status: Abnormal  Collection Time: 09/27/19  8:12 AM  Result Value Ref Range   WBC 21.8 (H) 4.0 - 10.5 K/uL   RBC 3.63 (L) 3.87 - 5.11 MIL/uL   Hemoglobin 10.6 (L) 12.0 - 15.0 g/dL   HCT 34.2 (L) 36 - 46 %   MCV 94.2 80.0 - 100.0 fL   MCH 29.2 26.0 - 34.0 pg   MCHC 31.0 30.0 - 36.0 g/dL   RDW 14.5 11.5 - 15.5 %   Platelets 210 150 - 400 K/uL   nRBC 0.0 0.0 - 0.2 %     PHYSICAL EXAM:   Gen: resting comfortably in bed, NAD, pleasant, daughter at bedside  Lungs: unlabored Cardiac: RRR Abd: + BS Ext:       Right Lower Extremity   Dressings c/d/i  Ext warm   Swelling mild  No DCT   Compartments are soft, no pain out of proportion with passive stretch   DPN, SPN, TN sensation intact  EHL, FHL, AT, PT, peroneals, gastroc motor intact   Leg length and alignment look appropriate and symmetric   Assessment/Plan: 1 Day Post-Op   Active Problems:   Hypertension   Hyperlipidemia   History of atrial fibrillation   CAD (coronary artery disease)   GERD (gastroesophageal reflux disease)   Anxiety state   CKD (chronic kidney disease), stage III   PAF (paroxysmal atrial fibrillation) (HCC)   Hypokalemia   Paroxysmal A-fib (HCC)   Femur fracture, right (HCC)   Chronic diastolic CHF (congestive heart failure) ([place])   Anti-infectives (From admission,  onward)   Start     Dose/Rate Route Frequency Ordered Stop   09/27/19 1600  ceFAZolin (ANCEF) IVPB 1 g/50 mL premix     Discontinue     1 g 100 mL/hr over 30 Minutes Intravenous Every 8 hours 09/27/19 1212 09/28/19 0559   09/26/19 1130  ceFAZolin (ANCEF) IVPB 2g/100 mL premix        2 g 200 mL/hr over 30 Minutes Intravenous On call to O.R. 09/26/19 1100 09/26/19 1219   09/26/19 1110  ceFAZolin (ANCEF) 2-4 GM/100ML-% IVPB       Note to Pharmacy: Grace Blight   : cabinet override      09/26/19 1110 09/26/19 1238    .  POD/HD#: 1  85 y/o female s/p fall with R distal femur fracture, complex medical history   -fall   - Right medial femoral condyle frature s/p ORIF   NWB x 6-8 weeks  Unrestricted ROM R knee  Ice and elevate for swelling and pain control  No pillows under knee at rest, pillows should be placed under ankle to elevate leg and to keep knee in full extension when not working on exercises. This will help prevent contracture   PT/OT  - Pain management:  Titrate accordingly   Minimize narcotics   - ABL anemia/Hemodynamics  Stable   Monitor   - Medical issues   Per primary   - DVT/PE prophylaxis:  Cards recommends dc eliquis due to fall risk  lovenox while inpatient   - ID:   periop abx completed    - Metabolic Bone Disease:  On fosamax PTA   Recommend dc this medication indefinitely. Reason noted above   Do not see recent dexa in chart  Meets criteria for fracture liaison consult   Order placed  Check vitamin d    Vitamin d deficiency associated with falls and poor MSK function  - Activity:  As above  - Impediments to fracture  healing:  Osteoporosis   Chronic pain meds (fentanyl patch)  - Dispo:  Continue with therapies   TOC consult    Jari Pigg, PA-C [phone] (C) 09/27/2019, 2:07 PM  [facility] [place] [address] [phone] Domingo Sep (F)

## 2019-09-27 NOTE — Progress Notes (Signed)
PROGRESS NOTE        PATIENT DETAILS Name: Betty Jackson Age: 85 y.o. Sex: female Date of Birth: [birth year] Admit Date: 09/25/2019 Admitting Physician Toy Baker, MD FBP:[id number], Alinda Sierras, MD  Brief Narrative: Patient is a 85 y.o. female with history of CAD s/p prior PCI, CVA, HTN, HLD, atrial fibrillation on anticoagulation, giant cell arteritis-presented with a mechanical fall-she was found to have a right femur fracture and admitted to the hospitalist service.  See below for further details.  Significant events: 6/28>> admit to [facility] following mechanical fall-found to have right femur fracture.  Significant studies: 6/28>> CT right knee: Oblique fracture through the medial aspect of the distal right femoral metaphysis, sagittal fracture involving the periphery of the posterior aspect of the femoral condyle 6/28>> CT abdomen/pelvis: No acute interval abdominal/pelvic abnormality. 6/28>> x-ray right knee: Acute fracture of the medial cortex of the distal metaphysis of the right femur 6/28>> chest x-ray: No active cardiopulmonary disease 6/28>> x-ray right foot: No fracture/dislocation  Antimicrobial therapy: None  Microbiology data: None  Procedures : 6/29>> ORIF right distal femur  Consults: Orthopedics Cardiology  DVT Prophylaxis : SCDs Start: 09/25/19 2028    Subjective: Lying comfortably in bed-no chest pain or shortness of breath.  Assessment/Plan: Right distal femoral fracture: Orthopedics following-underwent ORIF on 6/29.  Await further recommendations from the orthopedics team.  PAF: Continue metoprolol/Cardizem for rate control-evaluated by cardiology-due to frequent falls-recommendations are not to resume Eliquis.  Chronic diastolic heart failure: Euvolemic on exam-follow closely.  CKD stage II: Creatinine close to baseline-follow.  CAD: No anginal symptoms-follow closely.  History of giant cell arteritis: Had been on a  prednisone taper prior to this hospital stay-resume 30 mg of prednisone for 2 more days, then 20 mg of prednisone x4 days, then transition back to usual home regimen of 10 mg daily.  History of pericardial effusion s/p subxiphoid pericardial window December 2020:  Thought to be secondary to viral pericarditis  Anxiety/depression: Appears to be well controlled-continue Lexapro.  Diet: Diet Order            Diet Heart Room service appropriate? Yes; Fluid consistency: Thin  Diet effective now                  Code Status: DNR  Family Communication: None at bedside  Disposition Plan: Status is: Inpatient  Remains inpatient appropriate because:Inpatient level of care appropriate due to severity of illness  Dispo:  Patient From: Home  Planned Disposition: [place]  Expected discharge date: 09/30/19  Medically stable for discharge: No  Barriers to Discharge: Right distal femur fracture-s/p ORIF on 6/29-awaiting PT eval to determine appropriate disposition  Antimicrobial agents: Anti-infectives (From admission, onward)   Start     Dose/Rate Route Frequency Ordered Stop   09/27/19 1600  ceFAZolin (ANCEF) IVPB 1 g/50 mL premix     Discontinue     1 g 100 mL/hr over 30 Minutes Intravenous Every 8 hours 09/27/19 1212 09/28/19 0559   09/26/19 1130  ceFAZolin (ANCEF) IVPB 2g/100 mL premix        2 g 200 mL/hr over 30 Minutes Intravenous On call to O.R. 09/26/19 1100 09/26/19 1219   09/26/19 1110  ceFAZolin (ANCEF) 2-4 GM/100ML-% IVPB       Note to Pharmacy: Grace Blight   : cabinet override  09/26/19 1110 09/26/19 1238       Time spent: 25- minutes-Greater than 50% of this time was spent in counseling, explanation of diagnosis, planning of further management, and coordination of care.  MEDICATIONS: Scheduled Meds: . diltiazem  240 mg Oral Daily  . escitalopram  10 mg Oral Daily  . fentaNYL  1 patch Transdermal Q72H  . metoprolol tartrate  100 mg Oral  BID  . predniSONE  30 mg Oral Q breakfast   Continuous Infusions: .  ceFAZolin (ANCEF) IV    . methocarbamol (ROBAXIN) IV     PRN Meds:.ALPRAZolam, HYDROcodone-acetaminophen, HYDROmorphone (DILAUDID) injection, methocarbamol **OR** methocarbamol (ROBAXIN) IV   PHYSICAL EXAM: Vital signs: Vitals:   09/26/19 2012 09/27/19 0342 09/27/19 0808 09/27/19 1446  BP: (!) 131/55 (!) 128/59 (!) 150/92 (!) 140/51  Pulse: 63 (!) 53 (!) 59 62  Resp: 16 18 16 16   Temp: 98 F (36.7 C) (!) 97.5 F (36.4 C) 97.7 F (36.5 C) 98.6 F (37 C)  TempSrc: Oral Oral Oral Oral  SpO2: 95% 100% 100% 98%  Weight:  65.3 kg    Height:       Filed Weights   09/25/19 1113 09/26/19 1058 09/27/19 0342  Weight: 68 kg 68 kg 65.3 kg   Body mass index is 24.7 kg/m.   Gen Exam:Alert awake-not in any distress HEENT:atraumatic, normocephalic Chest: B/L clear to auscultation anteriorly CVS:S1S2 regular Abdomen:soft non tender, non distended Extremities:no edema Neurology: Non focal Skin: no rash  I have personally reviewed following labs and imaging studies  LABORATORY DATA: CBC: Recent Labs  Lab 09/25/19 1419 09/26/19 0004 09/27/19 0812  WBC 20.9* 14.1* 21.8*  NEUTROABS 17.8*  --   --   HGB 12.9 11.9* 10.6*  HCT 41.1 37.8 34.2*  MCV 93.2 92.6 94.2  PLT 244 243 382    Basic Metabolic Panel: Recent Labs  Lab 09/25/19 1419 09/26/19 0004 09/27/19 0812  NA 141 142 144  K 3.4* 3.7 4.6  CL 96* 99 102  CO2 31 32 30  GLUCOSE 97 98 146*  BUN 26* 17 17  CREATININE 1.01* 0.96 1.19*  CALCIUM 8.7* 8.6* 8.6*  MG  --  1.8  --     GFR: Estimated Creatinine Clearance: 30.9 mL/min (A) (by C-G formula based on SCr of 1.19 mg/dL (H)).  Liver Function Tests: Recent Labs  Lab 09/27/19 0812  AST 18  ALT 19  ALKPHOS 74  BILITOT 0.9  PROT 6.0*  ALBUMIN 3.0*   No results for input(s): LIPASE, AMYLASE in the last 168 hours. No results for input(s): AMMONIA in the last 168 hours.  Coagulation  Profile: Recent Labs  Lab 09/25/19 1419  INR 1.2    Cardiac Enzymes: No results for input(s): CKTOTAL, CKMB, CKMBINDEX, TROPONINI in the last 168 hours.  BNP (last 3 results) No results for input(s): PROBNP in the last 8760 hours.  Lipid Profile: No results for input(s): CHOL, HDL, LDLCALC, TRIG, CHOLHDL, LDLDIRECT in the last 72 hours.  Thyroid Function Tests: No results for input(s): TSH, T4TOTAL, FREET4, T3FREE, THYROIDAB in the last 72 hours.  Anemia Panel: No results for input(s): VITAMINB12, FOLATE, FERRITIN, TIBC, IRON, RETICCTPCT in the last 72 hours.  Urine analysis:    Component Value Date/Time   COLORURINE STRAW (A) 09/25/2019 1636   APPEARANCEUR CLEAR 09/25/2019 1636   LABSPEC 1.015 09/25/2019 1636   PHURINE 7.0 09/25/2019 1636   GLUCOSEU NEGATIVE 09/25/2019 1636   GLUCOSEU NEGATIVE 07/31/2016 1018   HGBUR MODERATE (A)  09/25/2019 1636   BILIRUBINUR NEGATIVE 09/25/2019 1636   BILIRUBINUR neg 07/31/2016 1012   KETONESUR NEGATIVE 09/25/2019 1636   PROTEINUR NEGATIVE 09/25/2019 1636   UROBILINOGEN 0.2 07/31/2016 1018   UROBILINOGEN 0.2 07/31/2016 1012   NITRITE NEGATIVE 09/25/2019 1636   LEUKOCYTESUR NEGATIVE 09/25/2019 1636    Sepsis Labs: Lactic Acid, Venous    Component Value Date/Time   LATICACIDVEN 1.9 06/20/2019 0106    MICROBIOLOGY: Recent Results (from the past 240 hour(s))  SARS Coronavirus 2 by RT PCR (hospital order, performed in [facility] hospital lab) Nasopharyngeal Nasopharyngeal Swab     Status: None   Collection Time: 09/25/19  3:11 PM   Specimen: Nasopharyngeal Swab  Result Value Ref Range Status   SARS Coronavirus 2 NEGATIVE NEGATIVE Final    Comment: (NOTE) SARS-CoV-2 target nucleic acids are NOT DETECTED.  The SARS-CoV-2 RNA is generally detectable in upper and lower respiratory specimens during the acute phase of infection. The lowest concentration of SARS-CoV-2 viral copies this assay can detect is 250 copies / mL. A  negative result does not preclude SARS-CoV-2 infection and should not be used as the sole basis for treatment or other patient management decisions.  A negative result may occur with improper specimen collection / handling, submission of specimen other than nasopharyngeal swab, presence of viral mutation(s) within the areas targeted by this assay, and inadequate number of viral copies (<250 copies / mL). A negative result must be combined with clinical observations, patient history, and epidemiological information.  Fact Sheet for Patients:   StrictlyIdeas.no  Fact Sheet for Healthcare Providers: BankingDealers.co.za  This test is not yet approved or  cleared by the [place] FDA and has been authorized for detection and/or diagnosis of SARS-CoV-2 by FDA under an Emergency Use Authorization (EUA).  This EUA will remain in effect (meaning this test can be used) for the duration of the COVID-19 declaration under Section 564(b)(1) of the Act, 21 U.S.C. section 360bbb-3(b)(1), unless the authorization is terminated or revoked sooner.  Performed at [facility], [address] [place], [place] [address]   MRSA PCR Screening     Status: None   Collection Time: 09/26/19  6:06 AM   Specimen: Nasal Mucosa; Nasopharyngeal  Result Value Ref Range Status   MRSA by PCR NEGATIVE NEGATIVE Final    Comment:        The GeneXpert MRSA Assay (FDA approved for NASAL specimens only), is one component of a comprehensive MRSA colonization surveillance program. It is not intended to diagnose MRSA infection nor to guide or monitor treatment for MRSA infections. Performed at [facility], [place] [address], [place], [place] [address]     RADIOLOGY STUDIES/RESULTS: CT ABDOMEN PELVIS W CONTRAST  Result Date: 09/25/2019 CLINICAL DATA:  Fall EXAM: CT ABDOMEN AND PELVIS WITH CONTRAST TECHNIQUE: Multidetector CT imaging of the abdomen  and pelvis was performed using the standard protocol following bolus administration of intravenous contrast. CONTRAST:  157mL OMNIPAQUE IOHEXOL 300 MG/ML  SOLN COMPARISON:  CT 09/04/2019, 05/28/2019, 04/15/2019, 03/06/2019 FINDINGS: Lower chest: Lung bases demonstrate no acute consolidation or effusion. Mild cardiomegaly. Coronary vascular calcification. Hepatobiliary: Status post cholecystectomy. Mild intra and extrahepatic biliary dilatation as before. No focal hepatic abnormality Pancreas: Choose Spleen: Normal in size without focal abnormality. Adrenals/Urinary Tract: Adrenal glands are normal. Subcentimeter hypodensities in the left kidney too small to further characterize. No hydronephrosis. Bladder is slightly thick walled anteriorly. Stomach/Bowel: Stomach nonenlarged. No dilated small bowel. Negative appendix. Colon diverticular  disease without acute inflammatory change. Vascular/Lymphatic: Moderate aortic atherosclerosis without aneurysm. No suspicious adenopathy. Reproductive: Status post hysterectomy. No adnexal masses. Other: Negative for free air or free fluid. Small fat containing umbilical hernia. Musculoskeletal: Status post right hip replacement with artifact. Sclerosis and periosteal reaction involving the left inferior pubic ramus and ischial tuberosity corresponding to history of chronic fractures. Postsurgical changes of the lumbar spine L2 through L4. Post augmentation changes at T11 and L1. No definite acute osseous abnormality. IMPRESSION: 1. No CT evidence for acute intra-abdominal or pelvic abnormality. 2. Colon diverticular disease without acute inflammatory change. Aortic Atherosclerosis (ICD10-I70.0). Electronically Signed   By: Donavan Foil M.D.   On: 09/25/2019 17:36   DG Chest Port 1 View  Result Date: 09/25/2019 CLINICAL DATA:  Fall with femur fracture. EXAM: PORTABLE CHEST 1 VIEW COMPARISON:  06/19/2019 FINDINGS: Chronic cardiomegaly with left ventricular prominence. Chronic  aortic atherosclerosis. Chronic pulmonary scarring left more than right. No sign of active infiltrate, mass, effusion or collapse. No traumatic finding of an acute nature in the region. Old augmented lower thoracic fracture. IMPRESSION: No active cardiopulmonary disease. Chronic pulmonary scarring left worse than right. Electronically Signed   By: Nelson Chimes M.D.   On: 09/25/2019 15:18   DG Knee Complete 4 Views Right  Result Date: 09/26/2019 CLINICAL DATA:  85 year old female with distal right femur fracture, ORIF. EXAM: DG C-ARM 1-60 MIN; RIGHT KNEE - COMPLETE 4+ VIEW FLUOROSCOPY TIME:  Fluoroscopy Time:  0 minutes 50 seconds Radiation Exposure Index (if provided by the fluoroscopic device): 2.2 mGy Number of Acquired Spot Images: 0 COMPARISON:  Right knee CT 09/25/2019. FINDINGS: Seven intraoperative fluoroscopic spot views of the right knee demonstrate placement of medial malleable plate and screws and distal condyle level cannulated screws. Hardware appears intact. Alignment remains near anatomic. IMPRESSION: Distal right femur ORIF with no adverse features. Electronically Signed   By: Genevie Ann M.D.   On: 09/26/2019 15:42   DG Knee Right Port  Result Date: 09/27/2019 CLINICAL DATA:  Status post ORIF of distal right femoral fracture. EXAM: PORTABLE RIGHT KNEE - 1-2 VIEW COMPARISON:  Intraoperative films from earlier in the same day. FINDINGS: Fixation sideplate is noted along the lateral aspect of the distal femur. Two large fixation screws are noted traversing the distal aspect of the femur. Fracture fragments are in near anatomic alignment. Small joint effusion is noted. IMPRESSION: ORIF of distal right femoral fracture. Electronically Signed   By: Inez Catalina M.D.   On: 09/27/2019 14:13   DG Foot Complete Right  Result Date: 09/26/2019 CLINICAL DATA:  85 year old female with fall and right knee fracture. Right foot pain. EXAM: RIGHT FOOT COMPLETE - 3+ VIEW COMPARISON:  None. FINDINGS: Portable  views including cross-table lateral. Calcaneus and tarsal bones appear intact. Metatarsals appear intact with mild to moderate 1st MTP degeneration and mild hallux valgus. No phalanx fracture identified. Calcified peripheral vascular disease but no discrete soft tissue injury. IMPRESSION: No acute fracture or dislocation identified about the right foot. Electronically Signed   By: Genevie Ann M.D.   On: 09/26/2019 10:08   DG C-Arm 1-60 Min  Result Date: 09/26/2019 CLINICAL DATA:  85 year old female with distal right femur fracture, ORIF. EXAM: DG C-ARM 1-60 MIN; RIGHT KNEE - COMPLETE 4+ VIEW FLUOROSCOPY TIME:  Fluoroscopy Time:  0 minutes 50 seconds Radiation Exposure Index (if provided by the fluoroscopic device): 2.2 mGy Number of Acquired Spot Images: 0 COMPARISON:  Right knee CT 09/25/2019. FINDINGS: Seven intraoperative fluoroscopic spot  views of the right knee demonstrate placement of medial malleable plate and screws and distal condyle level cannulated screws. Hardware appears intact. Alignment remains near anatomic. IMPRESSION: Distal right femur ORIF with no adverse features. Electronically Signed   By: Genevie Ann M.D.   On: 09/26/2019 15:42     LOS: 2 days   Oren Binet, MD  [facility]    To contact the attending provider between 7A-7P or the covering provider during after hours 7P-7A, please log into the web site [email or web address] and access using universal [place] password for that web site. If you do not have the password, please call the hospital operator.  09/27/2019, 3:06 PM

## 2019-09-27 NOTE — Evaluation (Signed)
Clinical/Bedside Swallow Evaluation Patient Details  Name: Betty Jackson MRN: [id number] Date of Birth: 1935-03-03  Today's Date: 09/27/2019 Time: SLP Start Time (ACUTE ONLY): 1023 SLP Stop Time (ACUTE ONLY): 1043 SLP Time Calculation (min) (ACUTE ONLY): 20 min  Past Medical History:  Past Medical History:  Diagnosis Date  . Acute respiratory failure with hypoxia ([place]) 05/20/2017  . Anemia, unspecified 10/28/2012  . Anxiety state, unspecified 10/28/2012  . CAD (coronary artery disease)    a. Stent RCA in [facility];  b. Cath approx 2009 - nonobs per pt report.  . Cataract    immature on the left eye  . Chronic insomnia 02/07/2013  . Chronic lower back pain    scoliosis  . CKD (chronic kidney disease) stage 3, GFR 30-59 ml/min   . Complication of anesthesia    pt has a very high tolerance to meds  . CVA (cerebral infarction) 10/29/2012  . DDD (degenerative disc disease)   . Depression   . Diverticulitis    hx of  . Diverticulosis   . Enteritis   . GERD (gastroesophageal reflux disease) 09/01/2010  . Giant cell arteritis ([place])   . Hemorrhoids   . Herniated nucleus pulposus, L5-S1, right 11/04/2015  . History of scabies   . HTN (hypertension) 05/20/2017  . Hyperlipidemia    takes Lipitor daily  . Hypertension    takes Amlodipine,Losartan,Metoprolol,and HCTZ daily  . Incontinence of urine   . Insomnia    takes Restoril nightly  . Lumbar stenosis 04/25/2013  . Major depressive disorder, recurrent episode, moderate ([place]) 07/16/2013  . Migraines    "back in my 20's; none since" (10/28/2012)  . Osteoporosis   . PAF (paroxysmal atrial fibrillation) ([place]) 2011   a. lone epidode in 2011 according to notes.  . Rheumatoid arthritis ([place])   . Scoliosis   . Sepsis ([place]) 05/2019  . Sinus bradycardia    a. on chronic bb  . Temporal arteritis ([place]) 2011   a. followed @ [facility]; potential flareup 10/28/2012/notes 10/28/2012  . Vocal cord dysfunction    "they don't operate properly" (10/28/2012)    Past Surgical History:  Past Surgical History:  Procedure Laterality Date  . ABDOMINAL HYSTERECTOMY  ~ 1984   vaginally  . BACK SURGERY  7-65yrs ago   X Stop  . BLADDER SUSPENSION  2001  . BREAST BIOPSY Right   . CATARACT EXTRACTION W/ INTRAOCULAR LENS IMPLANT Right ~ 08/2012  . CHEST TUBE INSERTION Left 03/08/2019   Procedure: Chest Tube Insertion;  Surgeon: Ivin Poot, MD;  Location: [place];  Service: Thoracic;  Laterality: Left;  . COLONOSCOPY  01/26/2012   Procedure: COLONOSCOPY;  Surgeon: Ladene Artist, MD,FACG;  Location[facility] ENDOSCOPY;  Service: Endoscopy;  Laterality: N/A;  note the EGD is possible  . CORONARY ANGIOPLASTY WITH STENT PLACEMENT  2006   X 1 stent  . EPIDURAL BLOCK INJECTION    . ESOPHAGOGASTRODUODENOSCOPY  01/26/2012   Procedure: ESOPHAGOGASTRODUODENOSCOPY (EGD);  Surgeon: Ladene Artist, MD,FACG;  Location: [facility] ENDOSCOPY;  Service: Endoscopy;  Laterality: N/A;  . HEMIARTHROPLASTY HIP Right 2012  . IR EPIDUROGRAPHY  04/20/2019  . LAPAROSCOPIC CHOLECYSTECTOMY  2001  . LUMBAR FUSION  03/2013  . LUMBAR LAMINECTOMY/DECOMPRESSION MICRODISCECTOMY Right 11/04/2015   Procedure: Right Lumbar Five-Sacral One Microdiskectomy;  Surgeon: Kristeen Miss, MD;  Location: [place] NEURO ORS;  Service: Neurosurgery;  Laterality: Right;  Right L5-S1 Microdiskectomy  . moles removed that required stiches     one on leg and  one on face  . SUBXYPHOID PERICARDIAL WINDOW N/A 03/08/2019   Procedure: SUBXYPHOID PERICARDIAL WINDOW;  Surgeon: Ivin Poot, MD;  Location: [place];  Service: Thoracic;  Laterality: N/A;  . TEE WITHOUT CARDIOVERSION N/A 03/08/2019   Procedure: TRANSESOPHAGEAL ECHOCARDIOGRAM (TEE);  Surgeon: Prescott Gum, Collier Salina, MD;  Location: [place];  Service: Thoracic;  Laterality: N/A;  . TEMPORAL ARTERY BIOPSY / LIGATION Bilateral 2011  . TONSILLECTOMY AND ADENOIDECTOMY     at age 57  . TUBAL LIGATION  ~ 1982  . X-STOP IMPLANTATION  ~ 2010   "lower back" (10/28/2012)   HPI:   85 year old with past medical history significant for CKD stage III, CAD, stroke, temporal arteritis, RA on chronic steroids, status post L2-3, L3-L4 posterior fusion 2015 and L5-S1 microdiscectomy 2017.  Pt admitted after mechanical fall, sustaining right femur fx.  Underwent ORIF 6/29.  Pt had her swallowing evaluated during January admission of this year, with unremarkable findings.  Has remote hx of vocal fold dysfunction per pt's report.    Assessment / Plan / Recommendation Clinical Impression  Pt presents with normal oropharyngeal swallow with adequate mastication, brisk swallow response, no s/s of aspiration.  Oral mechanism exam is normal.  Continue current diet - no dysphagia.  SLP to sign off.  SLP Visit Diagnosis: Dysphagia, unspecified (R13.10)    Aspiration Risk  No limitations    Diet Recommendation   regular solids, thin liquids  Medication Administration: Whole meds with liquid    Other  Recommendations Oral Care Recommendations: Oral care BID   Follow up Recommendations None        Swallow Study   General HPI: 85 year old with past medical history significant for CKD stage III, CAD, stroke, temporal arteritis, RA on chronic steroids, status post L2-3, L3-L4 posterior fusion 2015 and L5-S1 microdiscectomy 2017.  Pt admitted after mechanical fall, sustaining right femur fx.  Underwent ORIF 6/29.  Pt had her swallowing evaluated during January admission of this year, with unremarkable findings.  Has remote hx of vocal fold dysfunction per pt's report.  Type of Study: Bedside Swallow Evaluation Previous Swallow Assessment: see HPI Diet Prior to this Study: Regular;Thin liquids Temperature Spikes Noted: No Respiratory Status: Nasal cannula History of Recent Intubation: Yes Length of Intubations (days):  (for surgery) Behavior/Cognition: Alert;Cooperative;Pleasant mood Oral Cavity Assessment: Within Functional Limits Oral Care Completed by SLP: No Oral Cavity -  Dentition: Adequate natural dentition Vision: Functional for self-feeding Self-Feeding Abilities: Able to feed self Patient Positioning: Upright in bed Baseline Vocal Quality: Hoarse Volitional Cough: Strong Volitional Swallow: Able to elicit    Oral/Motor/Sensory Function Overall Oral Motor/Sensory Function: Within functional limits   Ice Chips Ice chips: Within functional limits   Thin Liquid Thin Liquid: Within functional limits    Nectar Thick Nectar Thick Liquid: Not tested   Honey Thick Honey Thick Liquid: Not tested   Puree Puree: Within functional limits   Solid     Solid: Within functional limits      Juan Quam Laurice 09/27/2019,10:46 AM Estill Bamberg L. Tivis Ringer, [place] Office number [phone] Pager [phone]

## 2019-09-27 NOTE — TOC CAGE-AID Note (Signed)
Transition of Care [facility]) - CAGE-AID Screening   Patient Details  Name: Betty Jackson MRN: [id number] Date of Birth: March 14, 1935  [facility] CM/SW Contact:    Emeterio Reeve, [place] Phone Number: 09/27/2019, 5:02 PM   Clinical Narrative: CSW met with pt at bedside. CSW introduced self and explained her role at the hospital. Pt denied current alcohol use and substance use.   CAGE-AID Screening:    Have You Ever Felt You Ought to Cut Down on Your Drinking or Drug Use?: No Have People Annoyed You By Critizing Your Drinking Or Drug Use?: No Have You Felt Bad Or Guilty About Your Drinking Or Drug Use?: No Have You Ever Had a Drink or Used Drugs First Thing In The Morning to STeady Your Nerves or to Get Rid of a Hangover?: No CAGE-AID Score: 0  Substance Abuse Education Offered: Yes    Blima Ledger, [place] Social Worker [phone]

## 2019-09-27 NOTE — Plan of Care (Signed)

## 2019-09-27 NOTE — Progress Notes (Signed)
Progress Note  Patient Name: Betty Jackson Date of Encounter: 09/27/2019  [facility] Cardiologist: [name], MD   Subjective   No CP or dyspnea  Inpatient Medications    Scheduled Meds:  diltiazem  240 mg Oral Daily   escitalopram  10 mg Oral Daily   fentaNYL  1 patch Transdermal Q72H   methylPREDNISolone (SOLU-MEDROL) injection  10 mg Intravenous Once   metoprolol tartrate  100 mg Oral BID   Continuous Infusions:  methocarbamol (ROBAXIN) IV     PRN Meds: ALPRAZolam, HYDROcodone-acetaminophen, HYDROmorphone (DILAUDID) injection, methocarbamol **OR** methocarbamol (ROBAXIN) IV   Vital Signs    Vitals:   09/26/19 1532 09/26/19 2012 09/27/19 0342 09/27/19 0808  BP: (!) 138/56 (!) 131/55 (!) 128/59 (!) 150/92  Pulse: 70 63 (!) 53 (!) 59  Resp: 16 16 18 16   Temp: 97.9 F (36.6 C) 98 F (36.7 C) (!) 97.5 F (36.4 C) 97.7 F (36.5 C)  TempSrc: Oral Oral Oral Oral  SpO2: 96% 95% 100% 100%  Weight:   65.3 kg   Height:        Intake/Output Summary (Last 24 hours) at 09/27/2019 0832 Last data filed at 09/27/2019 0344 Gross per 24 hour  Intake 1630 ml  Output 325 ml  Net 1305 ml   Last 3 Weights 09/27/2019 09/26/2019 09/25/2019  Weight (lbs) 143 lb 15.4 oz 150 lb 150 lb  Weight (kg) 65.3 kg 68.04 kg 68.04 kg      Telemetry    Sinus - Personally Reviewed  Physical Exam   GEN: No acute distress.   Neck: No JVD Cardiac: RRR, no murmurs, rubs, or gallops.  Respiratory: Clear to auscultation bilaterally. GI: Soft, nontender, non-distended  MS: No edema; s/p repair of femur fx Neuro:  Nonfocal  Psych: Normal affect   Labs    Chemistry Recent Labs  Lab 09/25/19 1419 09/26/19 0004  NA 141 142  K 3.4* 3.7  CL 96* 99  CO2 31 32  GLUCOSE 97 98  BUN 26* 17  CREATININE 1.01* 0.96  CALCIUM 8.7* 8.6*  GFRNONAA 51* 55*  GFRAA 60* >60  ANIONGAP 14 11     Hematology Recent Labs  Lab 09/25/19 1419 09/26/19 0004  WBC 20.9* 14.1*    RBC 4.41 4.08  HGB 12.9 11.9*  HCT 41.1 37.8  MCV 93.2 92.6  MCH 29.3 29.2  MCHC 31.4 31.5  RDW 14.7 14.9  PLT 244 243    Radiology    CT Knee Right Wo Contrast  Result Date: 09/25/2019 CLINICAL DATA:  Right knee pain and swelling since a fall yesterday. Distal femur fracture. EXAM: CT OF THE RIGHT KNEE WITHOUT CONTRAST TECHNIQUE: Multidetector CT imaging of the right knee was performed according to the standard protocol. Multiplanar CT image reconstructions were also generated. COMPARISON:  Radiographs dated 09/25/2019 FINDINGS: Bones/Joint/Cartilage There is an oblique fracture through the medial aspect of the distal right femoral metaphysis extending into the intercondylar notch. On the axial and coronal images there appears to be a sagittal fracture involving the periphery of the posterior aspect of femoral condyle but this is not definitive. No other fractures. Hemarthrosis. Tricompartmental osteoarthritis. Ganglion cyst extending along the popliteus tendon containing a small calcified loose body. Ligaments Posterior cruciate ligament and collateral ligaments are intact. Anterior cruciate ligament is not well enough seen for assessment. Muscles and Tendons Negative. Soft tissues Subcutaneous edema or hemorrhage around the knee to the expected degree. No definable hematoma. IMPRESSION: 1. Oblique fracture through the  medial aspect of the distal right femoral metaphysis extending into the intercondylar notch. 2. Possible sagittal fracture involving the periphery of the posterior aspect of the femoral condyle but this is not definitive. 3. Hemarthrosis. 4. Tricompartmental osteoarthritis. 5. Ganglion cyst extending along the popliteus tendon containing a small calcified loose body. Electronically Signed   By: [name] M.D.   On: 09/25/2019 14:39   CT ABDOMEN PELVIS W CONTRAST  Result Date: 09/25/2019 CLINICAL DATA:  Fall EXAM: CT ABDOMEN AND PELVIS WITH CONTRAST TECHNIQUE: Multidetector  CT imaging of the abdomen and pelvis was performed using the standard protocol following bolus administration of intravenous contrast. CONTRAST:  182mL OMNIPAQUE IOHEXOL 300 MG/ML  SOLN COMPARISON:  CT 09/04/2019, 05/28/2019, 04/15/2019, 03/06/2019 FINDINGS: Lower chest: Lung bases demonstrate no acute consolidation or effusion. Mild cardiomegaly. Coronary vascular calcification. Hepatobiliary: Status post cholecystectomy. Mild intra and extrahepatic biliary dilatation as before. No focal hepatic abnormality Pancreas: Choose Spleen: Normal in size without focal abnormality. Adrenals/Urinary Tract: Adrenal glands are normal. Subcentimeter hypodensities in the left kidney too small to further characterize. No hydronephrosis. Bladder is slightly thick walled anteriorly. Stomach/Bowel: Stomach nonenlarged. No dilated small bowel. Negative appendix. Colon diverticular disease without acute inflammatory change. Vascular/Lymphatic: Moderate aortic atherosclerosis without aneurysm. No suspicious adenopathy. Reproductive: Status post hysterectomy. No adnexal masses. Other: Negative for free air or free fluid. Small fat containing umbilical hernia. Musculoskeletal: Status post right hip replacement with artifact. Sclerosis and periosteal reaction involving the left inferior pubic ramus and ischial tuberosity corresponding to history of chronic fractures. Postsurgical changes of the lumbar spine L2 through L4. Post augmentation changes at T11 and L1. No definite acute osseous abnormality. IMPRESSION: 1. No CT evidence for acute intra-abdominal or pelvic abnormality. 2. Colon diverticular disease without acute inflammatory change. Aortic Atherosclerosis (ICD10-I70.0). Electronically Signed   By: Donavan Foil M.D.   On: 09/25/2019 17:36   DG Chest Port 1 View  Result Date: 09/25/2019 CLINICAL DATA:  Fall with femur fracture. EXAM: PORTABLE CHEST 1 VIEW COMPARISON:  06/19/2019 FINDINGS: Chronic cardiomegaly with left  ventricular prominence. Chronic aortic atherosclerosis. Chronic pulmonary scarring left more than right. No sign of active infiltrate, mass, effusion or collapse. No traumatic finding of an acute nature in the region. Old augmented lower thoracic fracture. IMPRESSION: No active cardiopulmonary disease. Chronic pulmonary scarring left worse than right. Electronically Signed   By: Nelson Chimes M.D.   On: 09/25/2019 15:18   DG Knee Complete 4 Views Right  Result Date: 09/26/2019 CLINICAL DATA:  85 year old female with distal right femur fracture, ORIF. EXAM: DG C-ARM 1-60 MIN; RIGHT KNEE - COMPLETE 4+ VIEW FLUOROSCOPY TIME:  Fluoroscopy Time:  0 minutes 50 seconds Radiation Exposure Index (if provided by the fluoroscopic device): 2.2 mGy Number of Acquired Spot Images: 0 COMPARISON:  Right knee CT 09/25/2019. FINDINGS: Seven intraoperative fluoroscopic spot views of the right knee demonstrate placement of medial malleable plate and screws and distal condyle level cannulated screws. Hardware appears intact. Alignment remains near anatomic. IMPRESSION: Distal right femur ORIF with no adverse features. Electronically Signed   By: Genevie Ann M.D.   On: 09/26/2019 15:42   DG Knee Complete 4 Views Right  Result Date: 09/25/2019 CLINICAL DATA:  Fall, pain EXAM: RIGHT KNEE - COMPLETE 4+ VIEW COMPARISON:  None. FINDINGS: Alignment is anatomic. There is an acute fracture of the distal metaphysis of the femur involving the medial cortex. Minor displacement. There are changes of osteoarthritis with joint space narrowing and marginal osteophytes, greatest medially. Probable  joint effusion. Vascular calcifications noted. IMPRESSION: Acute fracture of the medial cortex of the distal metaphysis of the right femur with minimal displacement. Probable joint effusion. Osteoarthritis. Electronically Signed   By: Macy Mis M.D.   On: 09/25/2019 12:27   DG Foot Complete Right  Result Date: 09/26/2019 CLINICAL DATA:  85 year old  female with fall and right knee fracture. Right foot pain. EXAM: RIGHT FOOT COMPLETE - 3+ VIEW COMPARISON:  None. FINDINGS: Portable views including cross-table lateral. Calcaneus and tarsal bones appear intact. Metatarsals appear intact with mild to moderate 1st MTP degeneration and mild hallux valgus. No phalanx fracture identified. Calcified peripheral vascular disease but no discrete soft tissue injury. IMPRESSION: No acute fracture or dislocation identified about the right foot. Electronically Signed   By: Genevie Ann M.D.   On: 09/26/2019 10:08   DG C-Arm 1-60 Min  Result Date: 09/26/2019 CLINICAL DATA:  85 year old female with distal right femur fracture, ORIF. EXAM: DG C-ARM 1-60 MIN; RIGHT KNEE - COMPLETE 4+ VIEW FLUOROSCOPY TIME:  Fluoroscopy Time:  0 minutes 50 seconds Radiation Exposure Index (if provided by the fluoroscopic device): 2.2 mGy Number of Acquired Spot Images: 0 COMPARISON:  Right knee CT 09/25/2019. FINDINGS: Seven intraoperative fluoroscopic spot views of the right knee demonstrate placement of medial malleable plate and screws and distal condyle level cannulated screws. Hardware appears intact. Alignment remains near anatomic. IMPRESSION: Distal right femur ORIF with no adverse features. Electronically Signed   By: Genevie Ann M.D.   On: 09/26/2019 15:42   DG Hip Unilat W or Wo Pelvis 2-3 Views Right  Result Date: 09/25/2019 CLINICAL DATA:  Pain following recent fall EXAM: DG HIP (WITH OR WITHOUT PELVIS) 2-3V RIGHT COMPARISON:  Aug 11, 2019. FINDINGS: Frontal pelvis as well as frontal and lateral right hip images were obtained. There is a total hip replacement on the right with prosthetic components appearing well-seated. There is a healing fracture of the left ischium with callus formation in this area and bony remodeling. There is underlying osteoporosis. No acute appearing fracture evident. No dislocation. There is moderate narrowing of the left hip joint. There is degenerative change  in the visualized lower lumbar spine with postoperative changes. There are multiple foci of arterial vascular calcification. IMPRESSION: Status post total hip replacement right with prosthetic components well-seated. No acute fracture evident.  Healing fracture left ischium. Moderate narrowing left hip joint.  No erosive change. Bones osteoporotic. Postoperative change as well as degenerative change lower lumbar spine. Electronically Signed   By: Lowella Grip III M.D.   On: 09/25/2019 12:28    Patient Profile     85 year old female with past medical history of coronary artery disease, hypertension, hyperlipidemia, paroxysmal atrial fibrillation, temporal arteritis, history of pericardial effusion status post pericardial window for preoperative evaluation prior to repair of femur fracture.  Note she had been on amiodarone in the past but was discontinued due to potential for pulmonary toxicity.    Assessment & Plan    1 status post repair of femur fracture-patient doing well.  Management per orthopedics.  2 paroxysmal atrial fibrillation-patient remains in sinus rhythm.  We will continue present dose of Cardizem and metoprolol.  As outlined above amiodarone caused potential pulmonary toxicity in the past and we will avoid if she develops postoperative atrial fibrillation.  She had been on chronic apixaban at home but has fallen 4 times in the past several months per her daughter.  I therefore feel the risk of anticoagulation outweighs the benefit.  We will not resume apixaban.  3 coronary artery disease-we will add aspirin 81 mg daily when okay with orthopedics.  Would consider statin as an outpatient when she follows up with Dr. Angelena Form.  4 hypertension-blood pressure controlled.  Continue present medications.  5 history of chronic diastolic congestive heart failure-would resume preadmission dose of Lasix and potassium at discharge.  Cardiology will follow from a distance.  For questions  or updates, please contact [place] Please consult [email or web address] for contact info under        Signed, Kirk Ruths, MD  09/27/2019, 8:32 AM

## 2019-09-27 NOTE — Evaluation (Signed)
Physical Therapy Evaluation Patient Details Name: Betty Jackson MRN: [id number] DOB: 1934-04-12 Today's Date: 09/27/2019   History of Present Illness  Pt is an 85 y/o female admitted after fall. Pt is s/p ORIF. PMH includes HTN, CAD, CKD, a fib, CVA, and dCHF.   Clinical Impression  Pt is s/p surgery above with deficits below. Pt requiring max A +2 to stand using RW. PT had foot under pt's L foot, however, pt unable to maintain NWB on LLE. Feel pt would benefit from SNF level therapies at d/c given current mobility deficits. Pt and daughter to discuss d/c recommendations. If pt decides to go home, will need max HH services and DME below. Will continue to follow acutely to maximize functional mobility independence and safety.     Follow Up Recommendations SNF;Supervision/Assistance - 24 hour (max HH services if pt decides to d/c home)    Equipment Recommendations  Wheelchair cushion (measurements PT);Wheelchair (measurements PT);Other (comment) (hoyer lift and pad)    Recommendations for Other Services       Precautions / Restrictions Precautions Precautions: Fall Restrictions Weight Bearing Restrictions: Yes RLE Weight Bearing: Non weight bearing      Mobility  Bed Mobility Overal bed mobility: Needs Assistance Bed Mobility: Supine to Sit;Sit to Supine     Supine to sit: Mod assist Sit to supine: Min assist   General bed mobility comments: Mod A for LLE assist and trunk assist to come to sitting. Increased time required. Min A for LLE assist for return to supine.   Transfers Overall transfer level: Needs assistance Equipment used: Rolling walker (2 wheeled) Transfers: Sit to/from Stand Sit to Stand: Max assist;Mod assist;+2 physical assistance         General transfer comment: mod-max A +2 for lift assist and steadying. PT had foot under pt's LLE to help ensure, NWB, however, pt was not able to maintain, so returned to sitting.   Ambulation/Gait                 Stairs            Wheelchair Mobility    Modified Rankin (Stroke Patients Only)       Balance Overall balance assessment: Needs assistance Sitting-balance support: No upper extremity supported;Feet supported Sitting balance-[name]: Fair     Standing balance support: Bilateral upper extremity supported;During functional activity Standing balance-[name]: Poor Standing balance comment: Reliant on BUE support                              Pertinent Vitals/Pain Pain Assessment: Faces Faces Pain Scale: Hurts even more Pain Location: LLE  Pain Descriptors / Indicators: Aching;Operative site guarding Pain Intervention(s): Limited activity within patient's tolerance;Monitored during session;Repositioned    Home Living Family/patient expects to be discharged to:: Private residence Living Arrangements: Other (Comment) (caregivers) Available Help at Discharge: Family;Available 24 hours/day;Personal care attendant Type of Home: House Home Access: Ramped entrance     Home Layout: One level Home Equipment: Morton - 4 wheels;Shower seat;Grab bars - tub/shower;Transport chair Additional Comments: Has 24/7 caregiver support    Prior Function Level of Independence: Needs assistance   Gait / Transfers Assistance Needed: Very little ambulation with RW since previous admission to hospital. Uses transport chair otherwise  ADL's / Homemaking Assistance Needed: Required assist for bathing.         Hand Dominance        Extremity/Trunk Assessment   Upper Extremity  Assessment Upper Extremity Assessment: Defer to OT evaluation    Lower Extremity Assessment Lower Extremity Assessment: LLE deficits/detail LLE Deficits / Details: Deficits consistent with post op pain and weakness.     Cervical / Trunk Assessment Cervical / Trunk Assessment: Kyphotic  Communication   Communication: HOH  Cognition Arousal/Alertness: Awake/alert Behavior During Therapy:  WFL for tasks assessed/performed Overall Cognitive Status: Impaired/Different from baseline Area of Impairment: Problem solving                             Problem Solving: Slow processing        General Comments General comments (skin integrity, edema, etc.): Had lengthy conversation about SNF vs HHPT at d/c with pt and pt's daughter.     Exercises     Assessment/Plan    PT Assessment Patient needs continued PT services  PT Problem List Decreased strength;Decreased range of motion;Decreased balance;Decreased mobility;Decreased activity tolerance;Decreased knowledge of use of DME;Decreased knowledge of precautions;Decreased cognition;Pain       PT Treatment Interventions DME instruction;Functional mobility training;Therapeutic activities;Therapeutic exercise;Balance training;Patient/family education;Wheelchair mobility training    PT Goals (Current goals can be found in the Care Plan section)  Acute Rehab PT Goals Patient Stated Goal: to go home PT Goal Formulation: With patient Time For Goal Achievement: 10/11/19 Potential to Achieve Goals: Good    Frequency Min 3X/week   Barriers to discharge        Co-evaluation               AM-PAC PT "6 Clicks" Mobility  Outcome Measure Help needed turning from your back to your side while in a flat bed without using bedrails?: A Little Help needed moving from lying on your back to sitting on the side of a flat bed without using bedrails?: A Lot Help needed moving to and from a bed to a chair (including a wheelchair)?: Total Help needed standing up from a chair using your arms (e.g., wheelchair or bedside chair)?: Total Help needed to walk in hospital room?: Total Help needed climbing 3-5 steps with a railing? : Total 6 Click Score: 9    End of Session Equipment Utilized During Treatment: Gait belt Activity Tolerance: Patient limited by pain Patient left: in bed;with call bell/phone within reach;with bed  alarm set;with family/visitor present Nurse Communication: Mobility status;Patient requests pain meds PT Visit Diagnosis: Unsteadiness on feet (R26.81);Other abnormalities of gait and mobility (R26.89);Difficulty in walking, not elsewhere classified (R26.2);Pain Pain - Right/Left: Left Pain - part of body: Leg    Time: 1344-1416 PT Time Calculation (min) (ACUTE ONLY): 32 min   Charges:   PT Evaluation $PT Eval Moderate Complexity: 1 Mod PT Treatments $Therapeutic Activity: 8-22 mins        Lou Miner, DPT  Acute Rehabilitation Services  Pager: [phone] Office: [phone]   Rudean Hitt 09/27/2019, 5:52 PM

## 2019-09-28 ENCOUNTER — Other Ambulatory Visit: Payer: Self-pay | Admitting: Cardiovascular Disease

## 2019-09-28 LAB — CBC
HCT: 29.4 % — ABNORMAL LOW (ref 36.0–46.0)
Hemoglobin: 9.3 g/dL — ABNORMAL LOW (ref 12.0–15.0)
MCH: 29.3 pg (ref 26.0–34.0)
MCHC: 31.6 g/dL (ref 30.0–36.0)
MCV: 92.7 fL (ref 80.0–100.0)
Platelets: 195 10*3/uL (ref 150–400)
RBC: 3.17 MIL/uL — ABNORMAL LOW (ref 3.87–5.11)
RDW: 14.5 % (ref 11.5–15.5)
WBC: 20.5 10*3/uL — ABNORMAL HIGH (ref 4.0–10.5)
nRBC: 0 % (ref 0.0–0.2)

## 2019-09-28 LAB — BASIC METABOLIC PANEL
Anion gap: 10 (ref 5–15)
BUN: 19 mg/dL (ref 8–23)
CO2: 29 mmol/L (ref 22–32)
Calcium: 8.5 mg/dL — ABNORMAL LOW (ref 8.9–10.3)
Chloride: 101 mmol/L (ref 98–111)
Creatinine, Ser: 0.89 mg/dL (ref 0.44–1.00)
GFR calc Af Amer: >60 mL/min (ref >60)
GFR calc non Af Amer: 60 mL/min — ABNORMAL LOW (ref >60)
Glucose, Bld: 124 mg/dL — ABNORMAL HIGH (ref 70–99)
Potassium: 4.9 mmol/L (ref 3.5–5.1)
Sodium: 140 mmol/L (ref 135–145)

## 2019-09-28 LAB — SARS CORONAVIRUS 2 (TAT 6-24 HRS): SARS Coronavirus 2: NEGATIVE

## 2019-09-28 NOTE — Progress Notes (Signed)
PROGRESS NOTE        PATIENT DETAILS Name: Betty Jackson Age: 85 y.o. Sex: female Date of Birth: 06-Dec-1934 Admit Date: 09/25/2019 Admitting Physician Toy Baker, MD JJK:[id number], Alinda Sierras, MD  Brief Narrative: Patient is a 85 y.o. female with history of CAD s/p prior PCI, CVA, HTN, HLD, atrial fibrillation on anticoagulation, giant cell arteritis-presented with a mechanical fall-she was found to have a right femur fracture and admitted to the hospitalist service.  See below for further details.  Significant events: 6/28>> admit to [facility] following mechanical fall-found to have right femur fracture.  Significant studies: 6/28>> CT right knee: Oblique fracture through the medial aspect of the distal right femoral metaphysis, sagittal fracture involving the periphery of the posterior aspect of the femoral condyle 6/28>> CT abdomen/pelvis: No acute interval abdominal/pelvic abnormality. 6/28>> x-ray right knee: Acute fracture of the medial cortex of the distal metaphysis of the right femur 6/28>> chest x-ray: No active cardiopulmonary disease 6/28>> x-ray right foot: No fracture/dislocation  Antimicrobial therapy: None  Microbiology data: None  Procedures : 6/29>> ORIF right distal femur  Consults: Orthopedics Cardiology  DVT Prophylaxis : enoxaparin (LOVENOX) injection 30 mg Start: 09/27/19 2200 SCDs Start: 09/25/19 2028   Subjective: No major issues overnight-lying comfortably in bed-pain controlled at the operative site.  Acknowledges that she may need to go to SNF-social work following.  Assessment/Plan: Right distal femoral fracture: Orthopedics following-underwent ORIF on 6/29.  Recommendations from orthopedics are for NWB x6-8 weeks.  PT/OT recommending SNF-social work following.  Leukocytosis: Etiology unclear-no fever-no symptoms that would support a underlying infection.  Could be reactive-watch closely-repeat CBC in the  morning.  Anemia: Likely secondary to perioperative blood loss anemia in the setting of femur fracture-follow CBC-no indication for blood transfusion at this point.  PAF: Continue metoprolol/Cardizem for rate control-evaluated by cardiology-due to frequent falls-recommendations are not to resume Eliquis.  Chronic diastolic heart failure: Euvolemic on exam-follow closely.  CKD stage II: Creatinine close to baseline-follow.  CAD: No anginal symptoms-follow closely.  History of giant cell arteritis: Had been on a prednisone taper prior to this hospital stay-stay on prednisone 30 mg for 1 more day-then 20 mg of prednisone x4 days, then transition back to usual home regimen of 10 mg daily.  History of pericardial effusion s/p subxiphoid pericardial window December 2020:  Thought to be secondary to viral pericarditis  Anxiety/depression: Appears to be well controlled-continue Lexapro.  Diet: Diet Order            Diet Heart Room service appropriate? Yes; Fluid consistency: Thin  Diet effective now                  Code Status: DNR  Family Communication: Daughter-Melissa over the phone.  Disposition Plan: Status is: Inpatient  Remains inpatient appropriate because:Inpatient level of care appropriate due to severity of illness  Dispo:  Patient From: Home  Planned Disposition: [place]  Expected discharge date: 09/30/19  Medically stable for discharge: No  Barriers to Discharge: Right distal femur fracture-s/p ORIF on 6/29-awaiting SNF   Antimicrobial agents: Anti-infectives (From admission, onward)   Start     Dose/Rate Route Frequency Ordered Stop   09/27/19 1600  ceFAZolin (ANCEF) IVPB 1 g/50 mL premix        1 g 100 mL/hr over 30 Minutes Intravenous Every 8 hours 09/27/19 1212  09/27/19 2111   09/26/19 1130  ceFAZolin (ANCEF) IVPB 2g/100 mL premix        2 g 200 mL/hr over 30 Minutes Intravenous On call to O.R. 09/26/19 1100 09/26/19 1219   09/26/19  1110  ceFAZolin (ANCEF) 2-4 GM/100ML-% IVPB       Note to Pharmacy: Grace Blight   : cabinet override      09/26/19 1110 09/26/19 1238       Time spent: 25- minutes-Greater than 50% of this time was spent in counseling, explanation of diagnosis, planning of further management, and coordination of care.  MEDICATIONS: Scheduled Meds: . vitamin C  500 mg Oral Daily  . cholecalciferol  2,000 Units Oral BID  . diltiazem  240 mg Oral Daily  . enoxaparin (LOVENOX) injection  30 mg Subcutaneous Q24H  . escitalopram  10 mg Oral Daily  . fentaNYL  1 patch Transdermal Q72H  . metoprolol tartrate  100 mg Oral BID  . predniSONE  30 mg Oral Q breakfast   Continuous Infusions: . methocarbamol (ROBAXIN) IV     PRN Meds:.ALPRAZolam, HYDROcodone-acetaminophen, HYDROmorphone (DILAUDID) injection, methocarbamol **OR** methocarbamol (ROBAXIN) IV   PHYSICAL EXAM: Vital signs: Vitals:   09/27/19 0808 09/27/19 1446 09/27/19 2143 09/28/19 0507  BP: (!) 150/92 (!) 140/51 (!) 143/73 (!) 143/63  Pulse: (!) 59 62 62 66  Resp: 16 16 16 16   Temp: 97.7 F (36.5 C) 98.6 F (37 C) 98.3 F (36.8 C) 97.7 F (36.5 C)  TempSrc: Oral Oral Oral Oral  SpO2: 100% 98% 99% 99%  Weight:      Height:       Filed Weights   09/25/19 1113 09/26/19 1058 09/27/19 0342  Weight: 68 kg 68 kg 65.3 kg   Body mass index is 24.7 kg/m.   Gen Exam:Alert awake-not in any distress HEENT:atraumatic, normocephalic Chest: B/L clear to auscultation anteriorly CVS:S1S2 regular Abdomen:soft non tender, non distended Extremities:no edema Neurology: Non focal Skin: no rash  I have personally reviewed following labs and imaging studies  LABORATORY DATA: CBC: Recent Labs  Lab 09/25/19 1419 09/26/19 0004 09/27/19 0812 09/28/19 0502  WBC 20.9* 14.1* 21.8* 20.5*  NEUTROABS 17.8*  --   --   --   HGB 12.9 11.9* 10.6* 9.3*  HCT 41.1 37.8 34.2* 29.4*  MCV 93.2 92.6 94.2 92.7  PLT 244 243 210 161    Basic Metabolic  Panel: Recent Labs  Lab 09/25/19 1419 09/26/19 0004 09/27/19 0812 09/28/19 0502  NA 141 142 144 140  K 3.4* 3.7 4.6 4.9  CL 96* 99 102 101  CO2 31 32 30 29  GLUCOSE 97 98 146* 124*  BUN 26* 17 17 19   CREATININE 1.01* 0.96 1.19* 0.89  CALCIUM 8.7* 8.6* 8.6* 8.5*  MG  --  1.8  --   --     GFR: Estimated Creatinine Clearance: 41.4 mL/min (by C-G formula based on SCr of 0.89 mg/dL).  Liver Function Tests: Recent Labs  Lab 09/27/19 0812  AST 18  ALT 19  ALKPHOS 74  BILITOT 0.9  PROT 6.0*  ALBUMIN 3.0*   No results for input(s): LIPASE, AMYLASE in the last 168 hours. No results for input(s): AMMONIA in the last 168 hours.  Coagulation Profile: Recent Labs  Lab 09/25/19 1419  INR 1.2    Cardiac Enzymes: No results for input(s): CKTOTAL, CKMB, CKMBINDEX, TROPONINI in the last 168 hours.  BNP (last 3 results) No results for input(s): PROBNP in the last 8760 hours.  Lipid Profile: No results for input(s): CHOL, HDL, LDLCALC, TRIG, CHOLHDL, LDLDIRECT in the last 72 hours.  Thyroid Function Tests: No results for input(s): TSH, T4TOTAL, FREET4, T3FREE, THYROIDAB in the last 72 hours.  Anemia Panel: No results for input(s): VITAMINB12, FOLATE, FERRITIN, TIBC, IRON, RETICCTPCT in the last 72 hours.  Urine analysis:    Component Value Date/Time   COLORURINE STRAW (A) 09/25/2019 1636   APPEARANCEUR CLEAR 09/25/2019 1636   LABSPEC 1.015 09/25/2019 1636   PHURINE 7.0 09/25/2019 1636   GLUCOSEU NEGATIVE 09/25/2019 1636   GLUCOSEU NEGATIVE 07/31/2016 1018   HGBUR MODERATE (A) 09/25/2019 1636   BILIRUBINUR NEGATIVE 09/25/2019 1636   BILIRUBINUR neg 07/31/2016 1012   KETONESUR NEGATIVE 09/25/2019 1636   PROTEINUR NEGATIVE 09/25/2019 1636   UROBILINOGEN 0.2 07/31/2016 1018   UROBILINOGEN 0.2 07/31/2016 1012   NITRITE NEGATIVE 09/25/2019 1636   LEUKOCYTESUR NEGATIVE 09/25/2019 1636    Sepsis Labs: Lactic Acid, Venous    Component Value Date/Time   LATICACIDVEN  1.9 06/20/2019 0106    MICROBIOLOGY: Recent Results (from the past 240 hour(s))  SARS Coronavirus 2 by RT PCR (hospital order, performed in [place] hospital lab) Nasopharyngeal Nasopharyngeal Swab     Status: None   Collection Time: 09/25/19  3:11 PM   Specimen: Nasopharyngeal Swab  Result Value Ref Range Status   SARS Coronavirus 2 NEGATIVE NEGATIVE Final    Comment: (NOTE) SARS-CoV-2 target nucleic acids are NOT DETECTED.  The SARS-CoV-2 RNA is generally detectable in upper and lower respiratory specimens during the acute phase of infection. The lowest concentration of SARS-CoV-2 viral copies this assay can detect is 250 copies / mL. A negative result does not preclude SARS-CoV-2 infection and should not be used as the sole basis for treatment or other patient management decisions.  A negative result may occur with improper specimen collection / handling, submission of specimen other than nasopharyngeal swab, presence of viral mutation(s) within the areas targeted by this assay, and inadequate number of viral copies (<250 copies / mL). A negative result must be combined with clinical observations, patient history, and epidemiological information.  Fact Sheet for Patients:   StrictlyIdeas.no  Fact Sheet for Healthcare Providers: BankingDealers.co.za  This test is not yet approved or  cleared by the [place] FDA and has been authorized for detection and/or diagnosis of SARS-CoV-2 by FDA under an Emergency Use Authorization (EUA).  This EUA will remain in effect (meaning this test can be used) for the duration of the COVID-19 declaration under Section 564(b)(1) of the Act, 21 U.S.C. section 360bbb-3(b)(1), unless the authorization is terminated or revoked sooner.  Performed at [facility], [address] [place], [place] [address]   MRSA PCR Screening     Status: None   Collection Time: 09/26/19  6:06 AM    Specimen: Nasal Mucosa; Nasopharyngeal  Result Value Ref Range Status   MRSA by PCR NEGATIVE NEGATIVE Final    Comment:        The GeneXpert MRSA Assay (FDA approved for NASAL specimens only), is one component of a comprehensive MRSA colonization surveillance program. It is not intended to diagnose MRSA infection nor to guide or monitor treatment for MRSA infections. Performed at [facility], [place] [address], [place], [place] [address]     RADIOLOGY STUDIES/RESULTS: DG Knee Right Port  Result Date: 09/27/2019 CLINICAL DATA:  Status post ORIF of distal right femoral fracture. EXAM: PORTABLE RIGHT KNEE - 1-2 VIEW COMPARISON:  Intraoperative films  from earlier in the same day. FINDINGS: Fixation sideplate is noted along the lateral aspect of the distal femur. Two large fixation screws are noted traversing the distal aspect of the femur. Fracture fragments are in near anatomic alignment. Small joint effusion is noted. IMPRESSION: ORIF of distal right femoral fracture. Electronically Signed   By: Inez Catalina M.D.   On: 09/27/2019 14:13     LOS: 3 days   Oren Binet, MD  [facility]    To contact the attending provider between 7A-7P or the covering provider during after hours 7P-7A, please log into the web site [email or web address] and access using universal  password for that web site. If you do not have the password, please call the hospital operator.  09/28/2019, 2:06 PM

## 2019-09-28 NOTE — Progress Notes (Signed)
Physical Therapy Treatment Patient Details Name: Betty Jackson MRN: [id number] DOB: [birth year] Today's Date: 09/28/2019    History of Present Illness Pt is an 85 y/o female admitted after fall with right femur fracture. Pt is s/p ORIF. PMH includes HTN, CAD, CKD, a fib, CVA, and dCHF.     PT Comments    Pt progressing towards their physical therapy goals, requiring less assist for transition to edge of bed. Still requiring two person maximal assist for low pivot transfer towards left. Rest of session focused on therapeutic exercises for strengthening/ROM. Pt continues with weakness and RLE pain. Continue to recommend SNF for ongoing Physical Therapy.      Follow Up Recommendations  SNF;Supervision/Assistance - 24 hour     Equipment Recommendations  Wheelchair cushion (measurements PT);Wheelchair (measurements PT);Other (comment) (hoyer lift and pad)    Recommendations for Other Services       Precautions / Restrictions Precautions Precautions: Fall Restrictions Weight Bearing Restrictions: Yes RLE Weight Bearing: Non weight bearing    Mobility  Bed Mobility Overal bed mobility: Needs Assistance Bed Mobility: Supine to Sit     Supine to sit: Min assist     General bed mobility comments: MinA for LLE assist and increased time to come into sitting  Transfers Overall transfer level: Needs assistance Equipment used: None Transfers: Squat Pivot Transfers     Squat pivot transfers: Max assist;+2 physical assistance     General transfer comment: MaxA + 2 for low pivot transfer towards left. Cues for hand/foot positioning as well as head/hip relationship.  Ambulation/Gait                 Stairs             Wheelchair Mobility    Modified Rankin (Stroke Patients Only)       Balance Overall balance assessment: Needs assistance Sitting-balance support: No upper extremity supported;Feet supported Sitting balance-[name]: Fair                                       Cognition Arousal/Alertness: Awake/alert Behavior During Therapy: WFL for tasks assessed/performed Overall Cognitive Status: Impaired/Different from baseline Area of Impairment: Problem solving;Memory                     Memory: Decreased short-term memory       Problem Solving: Slow processing        Exercises General Exercises - Lower Extremity Ankle Circles/Pumps: Both;10 reps;Supine Quad Sets: Both;10 reps;Supine Heel Slides: AROM;AAROM;Both;10 reps;Supine    General Comments        Pertinent Vitals/Pain Pain Assessment: Faces Faces Pain Scale: Hurts little more Pain Location: LLE  Pain Descriptors / Indicators: Aching;Operative site guarding Pain Intervention(s): Limited activity within patient's tolerance;Monitored during session;Premedicated before session    Home Living                      Prior Function            PT Goals (current goals can now be found in the care plan section) Acute Rehab PT Goals Patient Stated Goal: to go home PT Goal Formulation: With patient Time For Goal Achievement: 10/11/19 Potential to Achieve Goals: Good Progress towards PT goals: Progressing toward goals    Frequency    Min 3X/week      PT Plan Current plan remains appropriate  Co-evaluation PT/OT/SLP Co-Evaluation/Treatment: Yes Reason for Co-Treatment: For patient/therapist safety;To address functional/ADL transfers PT goals addressed during session: Mobility/safety with mobility;Strengthening/ROM        AM-PAC PT "6 Clicks" Mobility   Outcome Measure  Help needed turning from your back to your side while in a flat bed without using bedrails?: A Little Help needed moving from lying on your back to sitting on the side of a flat bed without using bedrails?: A Lot Help needed moving to and from a bed to a chair (including a wheelchair)?: Total Help needed standing up from a chair using your arms (e.g.,  wheelchair or bedside chair)?: Total Help needed to walk in hospital room?: Total Help needed climbing 3-5 steps with a railing? : Total 6 Click Score: 9    End of Session Equipment Utilized During Treatment: Gait belt Activity Tolerance: Patient tolerated treatment well Patient left: with call bell/phone within reach;in chair Nurse Communication: Mobility status;Patient requests pain meds PT Visit Diagnosis: Unsteadiness on feet (R26.81);Other abnormalities of gait and mobility (R26.89);Difficulty in walking, not elsewhere classified (R26.2);Pain Pain - Right/Left: Left Pain - part of body: Leg     Time: 5436-0677 PT Time Calculation (min) (ACUTE ONLY): 32 min  Charges:  $Therapeutic Activity: 8-22 mins                       Wyona Almas, PT, DPT Acute Rehabilitation Services Pager [phone] Office [phone]    Deno Etienne 09/28/2019, 5:21 PM

## 2019-09-28 NOTE — Evaluation (Signed)
Occupational Therapy Evaluation Patient Details Name: MARONDA CAISON MRN: [id number] DOB: [birth year] Today's Date: 09/28/2019    History of Present Illness Pt is an 85 y/o female admitted after fall with a right femur fracture. Pt is s/p ORIF. PMH includes HTN, CAD, CKD, a fib, CVA, and dCHF.    Clinical Impression   PTA pt living at home with support of caregivers 24/7. She primarily used a w/c at baseline and had assist for both BADL/IADL. Pt reports LLE being weak at baseline, and now RLE is NWB following this admission. At time of eval, pt able to complete bed mobility at min A. Once EOB pt completed squat pivot transfer to recliner with max A +2 to clear hips. Pt does well use BUEs to scoot bottom, but needs increased support to raise hips off of transfer surface. Educated pt on chair push ups to improve this transfer. Given current status, recommend SNF at d/c for continued progression of BADL and safety prior to returning to home environment. Will continue to follow per POC listed below.    Follow Up Recommendations  SNF    Equipment Recommendations  None recommended by OT    Recommendations for Other Services       Precautions / Restrictions Precautions Precautions: Fall Restrictions Weight Bearing Restrictions: Yes RLE Weight Bearing: Non weight bearing      Mobility Bed Mobility Overal bed mobility: Needs Assistance Bed Mobility: Supine to Sit     Supine to sit: Min assist Sit to supine: Min assist   General bed mobility comments: MinA for LLE assist and increased time to come into sitting  Transfers Overall transfer level: Needs assistance Equipment used: 2 person hand held assist Transfers: Squat Pivot Transfers Sit to Stand: Max assist;Mod assist;+2 physical assistance   Squat pivot transfers: Max assist;+2 physical assistance     General transfer comment: MaxA + 2 for low pivot transfer towards left. Cues for hand/foot positioning as well as head/hip  relationship.    Balance Overall balance assessment: Needs assistance Sitting-balance support: No upper extremity supported;Feet supported Sitting balance-[name]: Fair     Standing balance support: Bilateral upper extremity supported;During functional activity Standing balance-[name]: Poor Standing balance comment: Reliant on BUE support                            ADL either performed or assessed with clinical judgement   ADL Overall ADL's : Needs assistance/impaired Eating/Feeding: Set up;Sitting   Grooming: Set up;Sitting   Upper Body Bathing: Set up;Sitting   Lower Body Bathing: Maximal assistance;Sitting/lateral leans;Sit to/from stand   Upper Body Dressing : Set up;Sitting   Lower Body Dressing: Maximal assistance;Sitting/lateral leans;Sit to/from stand   Toilet Transfer: Maximal assistance;+2 for physical assistance;Squat-pivot;BSC Toilet Transfer Details (indicate cue type and reason): simulated with recliner; pt able to pivot with max A +2 in squat position Toileting- Clothing Manipulation and Hygiene: Maximal assistance;Sitting/lateral lean;Sit to/from stand Toileting - Clothing Manipulation Details (indicate cue type and reason): for posterior peri care     Functional mobility during ADLs: Minimal assistance;+2 for physical assistance;+2 for safety/equipment       Vision Patient Visual Report: No change from baseline       Perception     Praxis      Pertinent Vitals/Pain Pain Assessment: Faces Faces Pain Scale: Hurts little more Pain Location: RLE Pain Descriptors / Indicators: Aching;Operative site guarding Pain Intervention(s): Limited activity within patient's tolerance;Monitored during  session;Repositioned     Hand Dominance     Extremity/Trunk Assessment Upper Extremity Assessment Upper Extremity Assessment: Generalized weakness   Lower Extremity Assessment Lower Extremity Assessment: Defer to PT evaluation        Communication Communication Communication: HOH   Cognition Arousal/Alertness: Awake/alert Behavior During Therapy: WFL for tasks assessed/performed Overall Cognitive Status: Impaired/Different from baseline Area of Impairment: Problem solving;Memory                     Memory: Decreased short-term memory       Problem Solving: Slow processing General Comments: increased time and cues for basic mobility commands   General Comments       Exercises Exercises: General Lower Extremity General Exercises - Lower Extremity Ankle Circles/Pumps: Both;10 reps;Supine Quad Sets: Both;10 reps;Supine Heel Slides: AROM;AAROM;Both;10 reps;Supine   Shoulder Instructions      Home Living Family/patient expects to be discharged to:: Private residence Living Arrangements: Other (Comment) (caregivers) Available Help at Discharge: Family;Available 24 hours/day;Personal care attendant Type of Home: House Home Access: Ramped entrance     Home Layout: One level     Bathroom Shower/Tub: Occupational psychologist: Handicapped height     Home Equipment: Environmental consultant - 4 wheels;Shower seat;Grab bars - tub/shower;Transport chair   Additional Comments: Has 24/7 caregiver support      Prior Functioning/Environment Level of Independence: Needs assistance  Gait / Transfers Assistance Needed: Very little ambulation with RW since previous admission to hospital. Uses transport chair otherwise ADL's / Homemaking Assistance Needed: Required assist for bathing.             OT Problem List: Decreased strength;Decreased knowledge of use of DME or AE;Decreased knowledge of precautions;Decreased activity tolerance;Decreased cognition;Impaired balance (sitting and/or standing);Pain      OT Treatment/Interventions: Self-care/ADL training;Therapeutic exercise;Patient/family education;Balance training;Energy conservation;Therapeutic activities;DME and/or AE instruction    OT Goals(Current  goals can be found in the care plan section) Acute Rehab OT Goals Patient Stated Goal: to go home OT Goal Formulation: With patient Time For Goal Achievement: 10/12/19 Potential to Achieve Goals: Good  OT Frequency: Min 2X/week   Barriers to D/C:            Co-evaluation PT/OT/SLP Co-Evaluation/Treatment: Yes Reason for Co-Treatment: For patient/therapist safety;To address functional/ADL transfers PT goals addressed during session: Mobility/safety with mobility;Strengthening/ROM OT goals addressed during session: ADL's and self-care;Proper use of Adaptive equipment and DME      AM-PAC OT "6 Clicks" Daily Activity     Outcome Measure Help from another person eating meals?: A Little Help from another person taking care of personal grooming?: A Little Help from another person toileting, which includes using toliet, bedpan, or urinal?: A Lot Help from another person bathing (including washing, rinsing, drying)?: A Lot Help from another person to put on and taking off regular upper body clothing?: A Little Help from another person to put on and taking off regular lower body clothing?: A Lot 6 Click Score: 15   End of Session Nurse Communication: Mobility status;Precautions  Activity Tolerance: Patient tolerated treatment well Patient left: in chair;with call bell/phone within reach;with chair alarm set  OT Visit Diagnosis: Other abnormalities of gait and mobility (R26.89);Muscle weakness (generalized) (M62.81);History of falling (Z91.81);Pain Pain - Right/Left: Right Pain - part of body: Leg                Time: 7124-5809 OT Time Calculation (min): 32 min Charges:  OT General Charges $OT Visit: 1 Visit  OT Evaluation $OT Eval Moderate Complexity: 1 Mod  Zenovia Jarred, MSOT, OTR/L [facility] Office Number: [phone] Pager: [phone]  Zenovia Jarred 09/28/2019, 6:16 PM

## 2019-09-28 NOTE — NC FL2 (Signed)
[place] MEDICAID FL2 LEVEL OF CARE SCREENING TOOL     IDENTIFICATION  Patient Name: Betty Jackson Birthdate: 1934-12-22 Sex: female Admission Date (Current Location): 09/25/2019  [facility] and [place] Number:  Herbalist and Address:  [facility], [place] [address] [place], [place] [address]      Provider Number: [id number]  Attending Physician Name and Address:  Jonetta Osgood, MD  Relative Name and Phone Number:  [name] - [phone]    Current Level of Care: Hospital Recommended Level of Care: [place] Prior Approval Number:    Date Approved/Denied:   PASRR Number: [id number]  Discharge Plan: SNF    Current Diagnoses: Patient Active Problem List   Diagnosis Date Noted  . Femur fracture, right ([place]) 09/25/2019  . Chronic diastolic CHF (congestive heart failure) ([place]) 09/25/2019  . HCAP (healthcare-associated pneumonia) 06/19/2019  . Encephalopathy 06/19/2019  . Lumbar degenerative disc disease   . Paroxysmal A-fib ([place])   . Hypomagnesemia   . C. difficile colitis   . Diarrhea 05/29/2019  . CHF (congestive heart failure) ([place]) 05/29/2019  . Recurrent falls 04/30/2019  . Pericardial effusion without cardiac tamponade 03/29/2019  . Hypoalbuminemia 03/27/2019  . Cardiac tamponade   . Pericarditis 03/06/2019  . Fever   . Septic shock ([place])   . Atypical chest pain   . Atrial fibrillation with rapid ventricular response ([place])   . Nausea 01/17/2019  . Acute diverticulitis 01/13/2019  . AKI (acute kidney injury) ([place]) 01/13/2019  . Chronic back pain 01/13/2019  . Tachycardia 01/13/2019  . Hypokalemia 01/13/2019  . Chronic low back pain 07/10/2018  . Influenza A 05/20/2017  . Sepsis ([place]) 05/20/2017  . Acute respiratory failure with hypoxia ([place]) 05/20/2017  . CKD (chronic kidney disease), stage III 05/20/2017  . PAF (paroxysmal atrial fibrillation) ([place]) 05/20/2017  . HTN (hypertension)  05/20/2017  . Giant cell arteritis ([place]) 05/20/2017  . Coronary disease 05/20/2017  . Abnormal urinalysis 05/20/2017  . Osteoporosis 05/08/2016  . Herniated nucleus pulposus, L5-S1, right 11/04/2015  . Major depressive disorder, recurrent episode, moderate ([place]) 07/16/2013  . Abdominal pain, epigastric 06/22/2013  . Lumbar stenosis 04/25/2013  . Chronic insomnia 02/07/2013  . Elevated transaminase level 02/07/2013  . CVA (cerebral infarction) 10/29/2012  . Visual changes 10/28/2012  . Depression 10/28/2012  . Anxiety state 10/28/2012  . Anemia, unspecified 10/28/2012  . Nonspecific (abnormal) findings on radiological and other examination of gastrointestinal tract 01/25/2012  . Hyponatremia 01/24/2012  . Hematuria 01/24/2012  . Enteritis 12/24/2011  . Abdominal pain, other specified site 12/24/2011  . Leg pain, right 02/18/2011  . Temporal arteritis ([place]) 09/01/2010  . Hypertension 09/01/2010  . Hyperlipidemia 09/01/2010  . History of atrial fibrillation 09/01/2010  . CAD (coronary artery disease) 09/01/2010  . GERD (gastroesophageal reflux disease) 09/01/2010  . Insomnia 09/01/2010    Orientation RESPIRATION BLADDER Height & Weight     Self, Time, Situation, Place  Normal External catheter Weight: 65.3 kg Height:  5' 4.02" (162.6 cm)  BEHAVIORAL SYMPTOMS/MOOD NEUROLOGICAL BOWEL NUTRITION STATUS      Continent Diet (See Discharge Summary)  AMBULATORY STATUS COMMUNICATION OF NEEDS Skin   Extensive Assist Verbally Surgical wounds                       Personal Care Assistance Level of Assistance  Bathing, Dressing Bathing Assistance: Limited assistance   Dressing Assistance: Limited assistance     Functional Limitations Info  Sight, Hearing, Speech Sight Info: Adequate Hearing Info: Adequate Speech Info: Adequate    SPECIAL CARE FACTORS FREQUENCY  PT (By licensed PT), OT (By licensed OT)     PT Frequency: 5 x per week OT Frequency: 5 x per week             Contractures Contractures Info: Not present    Additional Factors Info  Code Status, Allergies, Psychotropic Code Status Info: DNR Allergies Info: Dextromethorphan Psychotropic Info: Xanex         Current Medications (09/28/2019):  This is the current hospital active medication list Current Facility-Administered Medications  Medication Dose Route Frequency Provider Last Rate Last Admin  . ALPRAZolam Duanne Moron) tablet 0.5 mg  0.5 mg Oral QHS PRN Toy Baker, MD   0.5 mg at 09/27/19 2145  . ascorbic acid (VITAMIN C) tablet 500 mg  500 mg Oral Daily Ainsley Spinner, PA-C   500 mg at 09/28/19 0827  . cholecalciferol (VITAMIN D3) tablet 2,000 Units  2,000 Units Oral BID Ainsley Spinner, PA-C   2,000 Units at 09/28/19 6967  . diltiazem (CARDIZEM CD) 24 hr capsule 240 mg  240 mg Oral Daily Doutova, Anastassia, MD   240 mg at 09/28/19 0827  . enoxaparin (LOVENOX) injection 30 mg  30 mg Subcutaneous Q24H Jonetta Osgood, MD   30 mg at 09/27/19 2037  . escitalopram (LEXAPRO) tablet 10 mg  10 mg Oral Daily Toy Baker, MD   10 mg at 09/28/19 0827  . fentaNYL (DURAGESIC) 12 MCG/HR 1 patch  1 patch Transdermal Q72H Lang Snow, FNP   1 patch at 09/25/19 2152  . HYDROcodone-acetaminophen (NORCO/VICODIN) 5-325 MG per tablet 1-2 tablet  1-2 tablet Oral Q6H PRN Toy Baker, MD   2 tablet at 09/28/19 0635  . HYDROmorphone (DILAUDID) injection 0.5 mg  0.5 mg Intravenous Q2H PRN Toy Baker, MD   0.5 mg at 09/27/19 1436  . methocarbamol (ROBAXIN) tablet 500 mg  500 mg Oral Q6H PRN Toy Baker, MD   500 mg at 09/28/19 0002   Or  . methocarbamol (ROBAXIN) 500 mg in dextrose 5 % 50 mL IVPB  500 mg Intravenous Q6H PRN Doutova, Anastassia, MD      . metoprolol tartrate (LOPRESSOR) tablet 100 mg  100 mg Oral BID Toy Baker, MD   100 mg at 09/28/19 0827  . predniSONE (DELTASONE) tablet 30 mg  30 mg Oral Q breakfast Jonetta Osgood, MD   30 mg at 09/28/19 0827      Discharge Medications: Please see discharge summary for a list of discharge medications.  Relevant Imaging Results:  Relevant Lab Results:   Additional Information [phone]  [place], [place]

## 2019-09-29 DIAGNOSIS — R6889 Other general symptoms and signs: Secondary | ICD-10-CM | POA: Diagnosis not present

## 2019-09-29 DIAGNOSIS — D62 Acute posthemorrhagic anemia: Secondary | ICD-10-CM | POA: Diagnosis not present

## 2019-09-29 DIAGNOSIS — Z7952 Long term (current) use of systemic steroids: Secondary | ICD-10-CM | POA: Diagnosis not present

## 2019-09-29 DIAGNOSIS — Z7401 Bed confinement status: Secondary | ICD-10-CM | POA: Diagnosis not present

## 2019-09-29 DIAGNOSIS — D6869 Other thrombophilia: Secondary | ICD-10-CM | POA: Diagnosis not present

## 2019-09-29 DIAGNOSIS — Z8673 Personal history of transient ischemic attack (TIA), and cerebral infarction without residual deficits: Secondary | ICD-10-CM | POA: Diagnosis not present

## 2019-09-29 DIAGNOSIS — M316 Other giant cell arteritis: Secondary | ICD-10-CM | POA: Diagnosis not present

## 2019-09-29 DIAGNOSIS — W19XXXA Unspecified fall, initial encounter: Secondary | ICD-10-CM | POA: Diagnosis not present

## 2019-09-29 DIAGNOSIS — I130 Hypertensive heart and chronic kidney disease with heart failure and stage 1 through stage 4 chronic kidney disease, or unspecified chronic kidney disease: Secondary | ICD-10-CM | POA: Diagnosis not present

## 2019-09-29 DIAGNOSIS — T84498A Other mechanical complication of other internal orthopedic devices, implants and grafts, initial encounter: Secondary | ICD-10-CM | POA: Diagnosis not present

## 2019-09-29 DIAGNOSIS — I4891 Unspecified atrial fibrillation: Secondary | ICD-10-CM | POA: Diagnosis not present

## 2019-09-29 DIAGNOSIS — Z20822 Contact with and (suspected) exposure to covid-19: Secondary | ICD-10-CM | POA: Diagnosis not present

## 2019-09-29 DIAGNOSIS — S0990XA Unspecified injury of head, initial encounter: Secondary | ICD-10-CM | POA: Diagnosis not present

## 2019-09-29 DIAGNOSIS — M79604 Pain in right leg: Secondary | ICD-10-CM | POA: Diagnosis not present

## 2019-09-29 DIAGNOSIS — W06XXXA Fall from bed, initial encounter: Secondary | ICD-10-CM | POA: Diagnosis present

## 2019-09-29 DIAGNOSIS — E8889 Other specified metabolic disorders: Secondary | ICD-10-CM | POA: Diagnosis not present

## 2019-09-29 DIAGNOSIS — I480 Paroxysmal atrial fibrillation: Secondary | ICD-10-CM | POA: Diagnosis not present

## 2019-09-29 DIAGNOSIS — S72401A Unspecified fracture of lower end of right femur, initial encounter for closed fracture: Secondary | ICD-10-CM | POA: Diagnosis not present

## 2019-09-29 DIAGNOSIS — F331 Major depressive disorder, recurrent, moderate: Secondary | ICD-10-CM | POA: Diagnosis present

## 2019-09-29 DIAGNOSIS — Z01818 Encounter for other preprocedural examination: Secondary | ICD-10-CM | POA: Diagnosis not present

## 2019-09-29 DIAGNOSIS — M25551 Pain in right hip: Secondary | ICD-10-CM | POA: Diagnosis not present

## 2019-09-29 DIAGNOSIS — Z7901 Long term (current) use of anticoagulants: Secondary | ICD-10-CM | POA: Diagnosis not present

## 2019-09-29 DIAGNOSIS — S72491D Other fracture of lower end of right femur, subsequent encounter for closed fracture with routine healing: Secondary | ICD-10-CM | POA: Diagnosis not present

## 2019-09-29 DIAGNOSIS — I2510 Atherosclerotic heart disease of native coronary artery without angina pectoris: Secondary | ICD-10-CM | POA: Diagnosis not present

## 2019-09-29 DIAGNOSIS — R69 Illness, unspecified: Secondary | ICD-10-CM | POA: Diagnosis not present

## 2019-09-29 DIAGNOSIS — S199XXA Unspecified injury of neck, initial encounter: Secondary | ICD-10-CM | POA: Diagnosis not present

## 2019-09-29 DIAGNOSIS — Z79899 Other long term (current) drug therapy: Secondary | ICD-10-CM | POA: Diagnosis not present

## 2019-09-29 DIAGNOSIS — Z66 Do not resuscitate: Secondary | ICD-10-CM | POA: Diagnosis not present

## 2019-09-29 DIAGNOSIS — L89612 Pressure ulcer of right heel, stage 2: Secondary | ICD-10-CM | POA: Diagnosis not present

## 2019-09-29 DIAGNOSIS — M2550 Pain in unspecified joint: Secondary | ICD-10-CM | POA: Diagnosis not present

## 2019-09-29 DIAGNOSIS — I499 Cardiac arrhythmia, unspecified: Secondary | ICD-10-CM | POA: Diagnosis not present

## 2019-09-29 DIAGNOSIS — D631 Anemia in chronic kidney disease: Secondary | ICD-10-CM | POA: Diagnosis not present

## 2019-09-29 DIAGNOSIS — Z96641 Presence of right artificial hip joint: Secondary | ICD-10-CM | POA: Diagnosis not present

## 2019-09-29 DIAGNOSIS — I10 Essential (primary) hypertension: Secondary | ICD-10-CM | POA: Diagnosis not present

## 2019-09-29 DIAGNOSIS — I5032 Chronic diastolic (congestive) heart failure: Secondary | ICD-10-CM | POA: Diagnosis not present

## 2019-09-29 DIAGNOSIS — G4700 Insomnia, unspecified: Secondary | ICD-10-CM | POA: Diagnosis not present

## 2019-09-29 DIAGNOSIS — S72431D Displaced fracture of medial condyle of right femur, subsequent encounter for closed fracture with routine healing: Secondary | ICD-10-CM | POA: Diagnosis not present

## 2019-09-29 DIAGNOSIS — N179 Acute kidney failure, unspecified: Secondary | ICD-10-CM | POA: Diagnosis not present

## 2019-09-29 DIAGNOSIS — S32592D Other specified fracture of left pubis, subsequent encounter for fracture with routine healing: Secondary | ICD-10-CM | POA: Diagnosis not present

## 2019-09-29 DIAGNOSIS — M069 Rheumatoid arthritis, unspecified: Secondary | ICD-10-CM | POA: Diagnosis not present

## 2019-09-29 DIAGNOSIS — M549 Dorsalgia, unspecified: Secondary | ICD-10-CM | POA: Diagnosis not present

## 2019-09-29 DIAGNOSIS — S72491A Other fracture of lower end of right femur, initial encounter for closed fracture: Secondary | ICD-10-CM | POA: Diagnosis not present

## 2019-09-29 DIAGNOSIS — M810 Age-related osteoporosis without current pathological fracture: Secondary | ICD-10-CM | POA: Diagnosis not present

## 2019-09-29 DIAGNOSIS — N1831 Chronic kidney disease, stage 3a: Secondary | ICD-10-CM | POA: Diagnosis not present

## 2019-09-29 DIAGNOSIS — T148XXA Other injury of unspecified body region, initial encounter: Secondary | ICD-10-CM | POA: Diagnosis not present

## 2019-09-29 DIAGNOSIS — M48061 Spinal stenosis, lumbar region without neurogenic claudication: Secondary | ICD-10-CM | POA: Diagnosis not present

## 2019-09-29 DIAGNOSIS — R918 Other nonspecific abnormal finding of lung field: Secondary | ICD-10-CM | POA: Diagnosis not present

## 2019-09-29 DIAGNOSIS — R0902 Hypoxemia: Secondary | ICD-10-CM | POA: Diagnosis not present

## 2019-09-29 DIAGNOSIS — S72401D Unspecified fracture of lower end of right femur, subsequent encounter for closed fracture with routine healing: Secondary | ICD-10-CM | POA: Diagnosis not present

## 2019-09-29 DIAGNOSIS — N183 Chronic kidney disease, stage 3 unspecified: Secondary | ICD-10-CM | POA: Diagnosis not present

## 2019-09-29 DIAGNOSIS — Z955 Presence of coronary angioplasty implant and graft: Secondary | ICD-10-CM | POA: Diagnosis not present

## 2019-09-29 DIAGNOSIS — E559 Vitamin D deficiency, unspecified: Secondary | ICD-10-CM | POA: Diagnosis not present

## 2019-09-29 DIAGNOSIS — Z743 Need for continuous supervision: Secondary | ICD-10-CM | POA: Diagnosis not present

## 2019-09-29 DIAGNOSIS — G8929 Other chronic pain: Secondary | ICD-10-CM | POA: Diagnosis not present

## 2019-09-29 DIAGNOSIS — W19XXXD Unspecified fall, subsequent encounter: Secondary | ICD-10-CM | POA: Diagnosis not present

## 2019-09-29 DIAGNOSIS — S7291XA Unspecified fracture of right femur, initial encounter for closed fracture: Secondary | ICD-10-CM | POA: Diagnosis not present

## 2019-09-29 DIAGNOSIS — E785 Hyperlipidemia, unspecified: Secondary | ICD-10-CM | POA: Diagnosis not present

## 2019-09-29 DIAGNOSIS — Y92129 Unspecified place in nursing home as the place of occurrence of the external cause: Secondary | ICD-10-CM | POA: Diagnosis not present

## 2019-09-29 DIAGNOSIS — D649 Anemia, unspecified: Secondary | ICD-10-CM | POA: Diagnosis not present

## 2019-09-29 DIAGNOSIS — T84124A Displacement of internal fixation device of right femur, initial encounter: Secondary | ICD-10-CM | POA: Diagnosis not present

## 2019-09-29 DIAGNOSIS — S72451A Displaced supracondylar fracture without intracondylar extension of lower end of right femur, initial encounter for closed fracture: Secondary | ICD-10-CM | POA: Diagnosis not present

## 2019-09-29 DIAGNOSIS — K219 Gastro-esophageal reflux disease without esophagitis: Secondary | ICD-10-CM | POA: Diagnosis not present

## 2019-09-29 DIAGNOSIS — M25532 Pain in left wrist: Secondary | ICD-10-CM | POA: Diagnosis not present

## 2019-09-29 DIAGNOSIS — K5900 Constipation, unspecified: Secondary | ICD-10-CM | POA: Diagnosis not present

## 2019-09-29 LAB — CBC
HCT: 28 % — ABNORMAL LOW (ref 36.0–46.0)
Hemoglobin: 8.6 g/dL — ABNORMAL LOW (ref 12.0–15.0)
MCH: 28.7 pg (ref 26.0–34.0)
MCHC: 30.7 g/dL (ref 30.0–36.0)
MCV: 93.3 fL (ref 80.0–100.0)
Platelets: 226 10*3/uL (ref 150–400)
RBC: 3 MIL/uL — ABNORMAL LOW (ref 3.87–5.11)
RDW: 14.4 % (ref 11.5–15.5)
WBC: 14.7 10*3/uL — ABNORMAL HIGH (ref 4.0–10.5)
nRBC: 0 % (ref 0.0–0.2)

## 2019-09-29 MED ORDER — ENOXAPARIN SODIUM 30 MG/0.3ML ~~LOC~~ SOLN: 30.0000 mg | SUBCUTANEOUS | 0 refills | Status: DC

## 2019-09-29 MED ORDER — ALPRAZOLAM 0.5 MG PO TABS: 0.5000 mg | ORAL_TABLET | Freq: Every evening | ORAL | 0 refills | Status: DC | PRN

## 2019-09-29 MED ORDER — PREDNISONE 10 MG PO TABS: ORAL_TABLET | ORAL | Status: DC

## 2019-09-29 MED ORDER — FENTANYL 12 MCG/HR TD PT72: 1.0000 | MEDICATED_PATCH | TRANSDERMAL | 0 refills | Status: DC

## 2019-09-29 MED ORDER — VITAMIN D3 25 MCG PO TABS: 2000.0000 [IU] | ORAL_TABLET | Freq: Two times a day (BID) | ORAL | Status: DC

## 2019-09-29 MED ORDER — PREDNISONE 20 MG PO TABS
20.0000 mg | ORAL_TABLET | Freq: Every day | ORAL | Status: DC
  Administered 2019-09-29: 20 mg via ORAL
  Filled 2019-09-29: qty 1

## 2019-09-29 MED ORDER — HYDROCODONE-ACETAMINOPHEN 10-325 MG PO TABS: ORAL_TABLET | ORAL | 0 refills | Status: DC

## 2019-09-29 NOTE — Progress Notes (Addendum)
Orthopaedic Trauma Service Progress Note  Patient ID: Betty Jackson MRN: [id number] DOB/AGE: 07/10/1934 85 y.o.  Subjective:  No pain in R leg  Doing well   Likely SNF today   ROS As above  Objective:   VITALS:   Vitals:   09/28/19 1500 09/28/19 2025 09/29/19 0326 09/29/19 0808  BP: (!) 142/68 (!) 154/63 (!) 142/60 (!) 162/61  Pulse: 68 65 (!) 53 (!) 56  Resp: 16 17 17 18   Temp: 98.3 F (36.8 C) 98.2 F (36.8 C) 97.7 F (36.5 C) 98 F (36.7 C)  TempSrc: Oral Oral Oral   SpO2: 98% 98% 95% 97%  Weight:      Height:        Estimated body mass index is 24.7 kg/m as calculated from the following:   Height as of this encounter: 5' 4.02" (1.626 m).   Weight as of this encounter: 65.3 kg.   Intake/Output      07/01 0701 - 07/02 0700 07/02 0701 - 07/03 0700   P.O. 600    Total Intake(mL/kg) 600 (9.2)    Urine (mL/kg/hr) 2350 (1.5)    Stool     Total Output 2350    Net -1750           LABS  Results for orders placed or performed during the hospital encounter of 09/25/19 (from the past 24 hour(s))  SARS CORONAVIRUS 2 (TAT 6-24 HRS) Nasopharyngeal Nasopharyngeal Swab     Status: None   Collection Time: 09/28/19  2:12 PM   Specimen: Nasopharyngeal Swab  Result Value Ref Range   SARS Coronavirus 2 NEGATIVE NEGATIVE  CBC     Status: Abnormal   Collection Time: 09/29/19  3:52 AM  Result Value Ref Range   WBC 14.7 (H) 4.0 - 10.5 K/uL   RBC 3.00 (L) 3.87 - 5.11 MIL/uL   Hemoglobin 8.6 (L) 12.0 - 15.0 g/dL   HCT 28.0 (L) 36 - 46 %   MCV 93.3 80.0 - 100.0 fL   MCH 28.7 26.0 - 34.0 pg   MCHC 30.7 30.0 - 36.0 g/dL   RDW 14.4 11.5 - 15.5 %   Platelets 226 150 - 400 K/uL   nRBC 0.0 0.0 - 0.2 %     PHYSICAL EXAM:   Gen: resting comfortably in bed, NAD, pleasant Ext:       Right Lower Extremity              Dressings c/d/i   Dressings removed   All wounds look great   New mepilex  dressings applied              Ext warm              Swelling mild             No DCT              Compartments are soft, no pain out of proportion with passive stretch              DPN, SPN, TN sensation intact             EHL, FHL, AT, PT, peroneals, gastroc motor intact              Leg length and alignment look appropriate and symmetric  Assessment/Plan: 3 Days Post-Op   Active Problems:   Hypertension   Hyperlipidemia   History of atrial fibrillation   CAD (coronary artery disease)   GERD (gastroesophageal reflux disease)   Anxiety state   CKD (chronic kidney disease), stage III   PAF (paroxysmal atrial fibrillation) (HCC)   Hypokalemia   Paroxysmal A-fib (HCC)   Femur fracture, right (HCC)   Chronic diastolic CHF (congestive heart failure) ([place])   Anti-infectives (From admission, onward)   Start     Dose/Rate Route Frequency Ordered Stop   09/27/19 1600  ceFAZolin (ANCEF) IVPB 1 g/50 mL premix        1 g 100 mL/hr over 30 Minutes Intravenous Every 8 hours 09/27/19 1212 09/27/19 2111   09/26/19 1130  ceFAZolin (ANCEF) IVPB 2g/100 mL premix        2 g 200 mL/hr over 30 Minutes Intravenous On call to O.R. 09/26/19 1100 09/26/19 1219   09/26/19 1110  ceFAZolin (ANCEF) 2-4 GM/100ML-% IVPB       Note to Pharmacy: Grace Blight   : cabinet override      09/26/19 1110 09/26/19 1238    .  POD/HD#: 4  85 y/o female s/p fall with R distal femur fracture, complex medical history    -fall    - Right medial femoral condyle frature s/p ORIF              NWB x 6-8 weeks             Unrestricted ROM R knee             Ice and elevate for swelling and pain control             No pillows under knee at rest, pillows should be placed under ankle to elevate leg and to keep knee in full extension when not working on exercises. This will help prevent contracture              PT/OT   Dressing changes as needed    See dc instructions for wound care    - Pain management:              Titrate accordingly              Minimize narcotics              - ABL anemia/Hemodynamics             Stable              Monitor    - Medical issues              Per primary    - DVT/PE prophylaxis:             cardiology dc'd eliquis due to fall risk   Dc on lovenox x 21 days as pt going to snf    - ID:              periop abx completed      - Metabolic Bone Disease:            On fosamax PTA              Would recommend permanent discontinuation of this medication for several reasons                         1. Appears not effective in this patient as she has sustained a low energy  fracture                         2. We do not recommend continuation of this medication during the fracture healing phase due to its MOA                         3. Studies show a possible correlation between Bisphosphonate use and development of A. Fib               Do not see recent dexa in chart             Meets criteria for fracture liaison consult                         Order placed             vitamin d insufficiency                          supplement   4000-5000 IUs vitamin d3 daily  - Activity:             As above   - Impediments to fracture healing:             Osteoporosis              Chronic pain meds (fentanyl patch)   - Dispo:            snf today   Follow up with ortho in 10-14 days   Jari Pigg, PA-C [phone] (C) 09/29/2019, 9:23 AM  [facility] [place] [address] [phone] Domingo Sep (F)

## 2019-09-29 NOTE — Discharge Instructions (Signed)
Orthopaedic Trauma Service Discharge Instructions   General Discharge Instructions  Orthopaedic Injuries:  Right distal femur fracture treated with open reduction internal fixation using plate and screws  WEIGHT BEARING STATUS: Nonweightbearing right leg  RANGE OF MOTION/ACTIVITY: Activity as tolerated while maintaining weightbearing restrictions.  Unrestricted range of motion right knee  Bone health: Labs show some mild vitamin D insufficiency.  Continue with vitamin D supplementation.  Stop current Fosamax use.  Would discontinue this indefinitely.  We can discuss alternative agents at your follow-up.  Wound Care: Daily wound care starting on 10/01/2019.  Please see below.  Discharge Wound Care Instructions  Do NOT apply any ointments, solutions or lotions to pin sites or surgical wounds.  These prevent needed drainage and even though solutions like hydrogen peroxide kill bacteria, they also damage cells lining the pin sites that help fight infection.  Applying lotions or ointments can keep the wounds moist and can cause them to breakdown and open up as well. This can increase the risk for infection. When in doubt call the office.  Surgical incisions should be dressed daily.  If any drainage is noted, use one layer of adaptic, then gauze, Kerlix, and an ace wrap.  Alternatively you can use a Mepilex type dressing which is the base dressing you have in the hospital.  You may use compression socks instead of an Ace wrap  Once the incision is completely dry and without drainage, it may be left open to air out.  Showering may begin 36-48 hours later.  Cleaning gently with soap and water.   DVT/PE prophylaxis: Lovenox 30 mg subcutaneous injection daily x 21 days  Diet: as you were eating previously.  Can use over the counter stool softeners and bowel preparations, such as Miralax, to help with bowel movements.  Narcotics can be constipating.  Be sure to drink plenty of fluids  PAIN  MEDICATION USE AND EXPECTATIONS  You have likely been given narcotic medications to help control your pain.  After a traumatic event that results in an fracture (broken bone) with or without surgery, it is ok to use narcotic pain medications to help control one's pain.  We understand that everyone responds to pain differently and each individual patient will be evaluated on a regular basis for the continued need for narcotic medications. Ideally, narcotic medication use should last no more than 6-8 weeks (coinciding with fracture healing).   As a patient it is your responsibility as well to monitor narcotic medication use and report the amount and frequency you use these medications when you come to your office visit.   We would also advise that if you are using narcotic medications, you should take a dose prior to therapy to maximize you participation.  IF YOU ARE ON NARCOTIC MEDICATIONS IT IS NOT PERMISSIBLE TO OPERATE A MOTOR VEHICLE (MOTORCYCLE/CAR/TRUCK/MOPED) OR HEAVY MACHINERY DO NOT MIX NARCOTICS WITH OTHER CNS (CENTRAL NERVOUS SYSTEM) DEPRESSANTS SUCH AS ALCOHOL   STOP SMOKING OR USING NICOTINE PRODUCTS!!!!  As discussed nicotine severely impairs your body's ability to heal surgical and traumatic wounds but also impairs bone healing.  Wounds and bone heal by forming microscopic blood vessels (angiogenesis) and nicotine is a vasoconstrictor (essentially, shrinks blood vessels).  Therefore, if vasoconstriction occurs to these microscopic blood vessels they essentially disappear and are unable to deliver necessary nutrients to the healing tissue.  This is one modifiable factor that you can do to dramatically increase your chances of healing your injury.    (This means  no smoking, no nicotine gum, patches, etc)  DO NOT USE NONSTEROIDAL ANTI-INFLAMMATORY DRUGS (NSAID'S)  Using products such as Advil (ibuprofen), Aleve (naproxen), Motrin (ibuprofen) for additional pain control during fracture  healing can delay and/or prevent the healing response.  If you would like to take over the counter (OTC) medication, Tylenol (acetaminophen) is ok.  However, some narcotic medications that are given for pain control contain acetaminophen as well. Therefore, you should not exceed more than 4000 mg of tylenol in a day if you do not have liver disease.  Also note that there are may OTC medicines, such as cold medicines and allergy medicines that my contain tylenol as well.  If you have any questions about medications and/or interactions please ask your doctor/PA or your pharmacist.      ICE AND ELEVATE INJURED/OPERATIVE EXTREMITY  Using ice and elevating the injured extremity above your heart can help with swelling and pain control.  Icing in a pulsatile fashion, such as 20 minutes on and 20 minutes off, can be followed.    Do not place ice directly on skin. Make sure there is a barrier between to skin and the ice pack.    Using frozen items such as frozen peas works well as the conform nicely to the are that needs to be iced.  USE AN ACE WRAP OR TED HOSE FOR SWELLING CONTROL  In addition to icing and elevation, Ace wraps or TED hose are used to help limit and resolve swelling.  It is recommended to use Ace wraps or TED hose until you are informed to stop.    When using Ace Wraps start the wrapping distally (farthest away from the body) and wrap proximally (closer to the body)   Example: If you had surgery on your leg or thing and you do not have a splint on, start the ace wrap at the toes and work your way up to the thigh        If you had surgery on your upper extremity and do not have a splint on, start the ace wrap at your fingers and work your way up to the upper arm  IF YOU ARE IN A SPLINT OR CAST DO NOT [place]   If your splint gets wet for any reason please contact the office immediately. You may shower in your splint or cast as long as you keep it dry.  This can be done by  wrapping in a cast cover or garbage back (or similar)  Do Not stick any thing down your splint or cast such as pencils, money, or hangers to try and scratch yourself with.  If you feel itchy take benadryl as prescribed on the bottle for itching  IF YOU ARE IN A CAM BOOT (BLACK BOOT)  You may remove boot periodically. Perform daily dressing changes as noted below.  Wash the liner of the boot regularly and wear a sock when wearing the boot. It is recommended that you sleep in the boot until told otherwise    Call office for the following:  Temperature greater than 101F  Persistent nausea and vomiting  Severe uncontrolled pain  Redness, tenderness, or signs of infection (pain, swelling, redness, odor or green/yellow discharge around the site)  Difficulty breathing, headache or visual disturbances  Hives  Persistent dizziness or light-headedness  Extreme fatigue  Any other questions or concerns you may have after discharge  In an emergency, call 911 or go to an Emergency Department at  a nearby hospital    CALL THE OFFICE WITH ANY QUESTIONS OR CONCERNS: [phone]   VISIT OUR WEBSITE FOR ADDITIONAL INFORMATION: [email or web address]

## 2019-09-29 NOTE — Progress Notes (Signed)
Attempted to call [facility] 3 different times to give report on patient before transport arrives and there has been no answer all 3 times. If the facility calls back then I will provide report at that time.

## 2019-09-29 NOTE — TOC Transition Note (Signed)
Transition of Care [facility]) - CM/SW Discharge Note   Patient Details  Name: Betty Jackson MRN: [id number] Date of Birth: 1934/08/06  [facility]) CM/SW Contact:  Curlene Labrum, RN Phone Number: 09/29/2019, 2:46 PM   Clinical Narrative:    Case Management met with the patient for discharge needs.  PTAR was arranged this morning for 2 pm and patient and daughter are aware of the patient's transfer to [place].  Patient was approved by in insurance for 7/2-10/03/2019 - ref # [id number] - Alison Murray is CM for insurance authorization [id number].   Final next level of care: [place] Barriers to Discharge: Continued Medical Work up   Patient Goals and CMS Choice Patient states their goals for this hospitalization and ongoing recovery are:: Patient and daughter want the patient to get the best of care and get home health coordinated before discharging home. CMS Medicare.gov Compare Post Acute Care list provided to:: Patient Represenative (must comment) (daughter) Choice offered to / list presented to : [place] / [place]  Discharge Placement                       Discharge Plan and Services   Discharge Planning Services: CM Consult Post Acute Care Choice: Home Health          DME Arranged:  (PT to evaluate for equipment needs) DME Agency: [place]: [place] ([place]) Date [place]: 09/26/19 Time [place]: [phone] Representative spoke with at [place]: [name], [name]  Social Determinants of Health (SDOH) Interventions     Readmission Risk Interventions Readmission Risk Prevention Plan 09/26/2019 06/21/2019 06/01/2019  Transportation Screening Complete Complete Complete  Medication Review Press photographer) Complete Complete Complete  PCP or Specialist appointment within 3-5 days of discharge Complete Not Complete Complete  PCP/Specialist Appt Not Complete comments -  likely SNF placement -  [place] or Home Care Consult Complete Not Complete Complete  HRI or Home Care Consult Pt Refusal Comments - likely SNF -  SW Recovery Care/Counseling Consult Complete Complete Complete  Palliative Care Screening Complete Complete -  [place] Complete Complete Not Applicable  Some recent data might be hidden

## 2019-09-29 NOTE — Discharge Summary (Signed)
PATIENT DETAILS Name: Betty Jackson Age: 85 y.o. Sex: female Date of Birth: 1934-09-23 MRN: [id number]. Admitting Physician: Toy Baker, MD YHC:[id number], Alinda Sierras, MD  Admit Date: 09/25/2019 Discharge date: 09/29/2019  Recommendations for Outpatient Follow-up:  1. Follow up with PCP in 1-2 weeks 2. Please obtain CMP/CBC in one week 3. Please ensure follow up with orthopedics  Admitted From:  Home  Disposition: SNF   Home Health: No  Equipment/Devices: None  Discharge Condition: Stable  CODE STATUS: DNR  Diet recommendation:  Diet Order            Diet - low sodium heart healthy           Diet Heart Room service appropriate? Yes; Fluid consistency: Thin  Diet effective now                  Brief Narrative: Patient is a 85 y.o. female with history of CAD s/p prior PCI, CVA, HTN, HLD, atrial fibrillation on anticoagulation, giant cell arteritis-presented with a mechanical fall-she was found to have a right femur fracture and admitted to the hospitalist service.  See below for further details.  Significant events: 6/28>> admit to [facility] following mechanical fall-found to have right femur fracture.  Significant studies: 6/28>> CT right knee: Oblique fracture through the medial aspect of the distal right femoral metaphysis, sagittal fracture involving the periphery of the posterior aspect of the femoral condyle 6/28>> CT abdomen/pelvis: No acute interval abdominal/pelvic abnormality. 6/28>> x-ray right knee: Acute fracture of the medial cortex of the distal metaphysis of the right femur 6/28>> chest x-ray: No active cardiopulmonary disease 6/28>> x-ray right foot: No fracture/dislocation  Antimicrobial therapy: None  Microbiology data: None  Procedures : 6/29>> ORIF right distal femur  Consults: Orthopedics Cardiology  Assessment/Plan: Right distal femoral fracture: Orthopedics following-underwent ORIF on 6/29.  Recommendations from  orthopedics are for NWB x6-8 weeks. Ortho recommending prophylactic Lovenox x 3 weeks.  PT/OT recommending SNF-stable for d/c when bed available  Leukocytosis: Etiology unclear-no fever-no symptoms that would support a underlying infection.  No diarrhea-although has hx of C Diff. Likely reactive (inflammation from surgery)-and from higher doses of steroids-now downtrending. Continue to monitor off Antibiotics.  Anemia: Likely secondary to perioperative blood loss anemia in the setting of femur fracture-follow CBC-no indication for blood transfusion at this point.  PAF: Continue metoprolol/Cardizem for rate control-evaluated by cardiology-due to frequent falls-recommendations are not to resume Eliquis.  Chronic diastolic heart failure: Euvolemic on exam-follow closely.  CKD stage II: Creatinine close to baseline-follow.  CAD: No anginal symptoms-follow closely.  History of giant cell arteritis: Had been on a prednisone taper prior to this hospital stay-stay on 20 mg of prednisone x4 days, then transition back to usual home regimen of 10 mg daily.  History of pericardial effusion s/p subxiphoid pericardial window December 2020:  Thought to be secondary to viral pericarditis  Anxiety/depression: Appears to be well controlled-continue Lexapro.  Discharge Diagnoses:  Active Problems:   Hypertension   Hyperlipidemia   History of atrial fibrillation   CAD (coronary artery disease)   GERD (gastroesophageal reflux disease)   Anxiety state   CKD (chronic kidney disease), stage III   PAF (paroxysmal atrial fibrillation) (HCC)   Hypokalemia   Paroxysmal A-fib (HCC)   Femur fracture, right (HCC)   Chronic diastolic CHF (congestive heart failure) ([place])   Discharge Instructions:  Activity:  Non weight bearing RLE for 6-8 weeks  Discharge Instructions    Call MD for:  redness, tenderness,  or signs of infection (pain, swelling, redness, odor or green/yellow discharge around incision  site)   Complete by: As directed    Diet - low sodium heart healthy   Complete by: As directed    Increase activity slowly   Complete by: As directed    No wound care   Complete by: As directed      Allergies as of 09/29/2019      Reactions   Dextromethorphan Rash      Medication List    STOP taking these medications   alendronate 70 MG tablet Commonly known as: FOSAMAX   cephALEXin 500 MG capsule Commonly known as: KEFLEX   Eliquis 5 MG Tabs tablet Generic drug: apixaban   vancomycin 125 MG capsule Commonly known as: VANCOCIN   Vitamin D-3 25 MCG (1000 UT) Caps Replaced by: Vitamin D3 25 MCG tablet     TAKE these medications   ALPRAZolam 0.5 MG tablet Commonly known as: XANAX Take 1 tablet (0.5 mg total) by mouth at bedtime as needed for sleep.   diltiazem 240 MG 24 hr capsule Commonly known as: CARDIZEM CD Take 1 capsule (240 mg total) by mouth daily.   enoxaparin 30 MG/0.3ML injection Commonly known as: LOVENOX Inject 0.3 mLs (30 mg total) into the skin daily for 21 days.   escitalopram 10 MG tablet Commonly known as: LEXAPRO TAKE 1 TABLET BY MOUTH EVERY DAY   famotidine 20 MG tablet Commonly known as: PEPCID Take 1 tablet (20 mg total) by mouth daily. What changed:   when to take this  reasons to take this   fentaNYL 12 MCG/HR Commonly known as: [place] 1 patch onto the skin every 3 (three) days. What changed: Another medication with the same name was removed. Continue taking this medication, and follow the directions you see here.   ferrous sulfate 325 (65 FE) MG tablet Take 1 tablet (325 mg total) by mouth 2 (two) times daily with a meal.   furosemide 40 MG tablet Commonly known as: LASIX Take 1 tablet (40 mg total) by mouth daily.   HYDROcodone-acetaminophen 10-325 MG tablet Commonly known as: NORCO Take one tablet by mouth every 8 hours as needed for breakthrough pain.   lactobacillus acidophilus Tabs tablet Take 2 tablets by  mouth 3 (three) times daily. What changed:   how much to take  when to take this   metoprolol tartrate 100 MG tablet Commonly known as: LOPRESSOR Take 1 tablet (100 mg total) by mouth 2 (two) times daily.   potassium chloride 10 MEQ tablet Commonly known as: KLOR-CON Take 1 tablet (10 mEq total) by mouth daily.   predniSONE 10 MG tablet Commonly known as: DELTASONE 20 mg daily x 4 days, then back to 10 mg daily What changed:   how much to take  how to take this  when to take this  additional instructions   vitamin C 500 MG tablet Commonly known as: ASCORBIC ACID Take 1,000 mg by mouth daily.   Vitamin D3 25 MCG tablet Commonly known as: Vitamin D Take 2 tablets (2,000 Units total) by mouth 2 (two) times daily. Replaces: Vitamin D-3 25 MCG (1000 UT) Caps   vitamin E 180 MG (400 UNITS) capsule Generic drug: vitamin E Take 400 Units by mouth daily.       Follow-up Information    Altamese Lott, MD. Schedule an appointment as soon as possible for a visit in 10 day(s).   Specialty: Orthopedic Surgery Contact information: [address]  [place] [address] [phone]        Eulas Post, MD. Schedule an appointment as soon as possible for a visit in 1 week(s).   Specialty: Family Medicine Contact information: [place] [address] [phone]        Burnell Blanks, MD. Schedule an appointment as soon as possible for a visit in 1 month(s).   Specialty: Cardiology Contact information: [address] [place] [address] [phone]              Allergies  Allergen Reactions  . Dextromethorphan Rash     Other Procedures/Studies: CT Knee Right Wo Contrast  Result Date: 09/25/2019 CLINICAL DATA:  Right knee pain and swelling since a fall yesterday. Distal femur fracture. EXAM: CT OF THE RIGHT KNEE WITHOUT CONTRAST TECHNIQUE: Multidetector CT imaging of the right knee was performed  according to the standard protocol. Multiplanar CT image reconstructions were also generated. COMPARISON:  Radiographs dated 09/25/2019 FINDINGS: Bones/Joint/Cartilage There is an oblique fracture through the medial aspect of the distal right femoral metaphysis extending into the intercondylar notch. On the axial and coronal images there appears to be a sagittal fracture involving the periphery of the posterior aspect of femoral condyle but this is not definitive. No other fractures. Hemarthrosis. Tricompartmental osteoarthritis. Ganglion cyst extending along the popliteus tendon containing a small calcified loose body. Ligaments Posterior cruciate ligament and collateral ligaments are intact. Anterior cruciate ligament is not well enough seen for assessment. Muscles and Tendons Negative. Soft tissues Subcutaneous edema or hemorrhage around the knee to the expected degree. No definable hematoma. IMPRESSION: 1. Oblique fracture through the medial aspect of the distal right femoral metaphysis extending into the intercondylar notch. 2. Possible sagittal fracture involving the periphery of the posterior aspect of the femoral condyle but this is not definitive. 3. Hemarthrosis. 4. Tricompartmental osteoarthritis. 5. Ganglion cyst extending along the popliteus tendon containing a small calcified loose body. Electronically Signed   By: [name] M.D.   On: 09/25/2019 14:39   CT ABDOMEN PELVIS W CONTRAST  Result Date: 09/25/2019 CLINICAL DATA:  Fall EXAM: CT ABDOMEN AND PELVIS WITH CONTRAST TECHNIQUE: Multidetector CT imaging of the abdomen and pelvis was performed using the standard protocol following bolus administration of intravenous contrast. CONTRAST:  187mL OMNIPAQUE IOHEXOL 300 MG/ML  SOLN COMPARISON:  CT 09/04/2019, 05/28/2019, 04/15/2019, 03/06/2019 FINDINGS: Lower chest: Lung bases demonstrate no acute consolidation or effusion. Mild cardiomegaly. Coronary vascular calcification. Hepatobiliary: Status  post cholecystectomy. Mild intra and extrahepatic biliary dilatation as before. No focal hepatic abnormality Pancreas: Choose Spleen: Normal in size without focal abnormality. Adrenals/Urinary Tract: Adrenal glands are normal. Subcentimeter hypodensities in the left kidney too small to further characterize. No hydronephrosis. Bladder is slightly thick walled anteriorly. Stomach/Bowel: Stomach nonenlarged. No dilated small bowel. Negative appendix. Colon diverticular disease without acute inflammatory change. Vascular/Lymphatic: Moderate aortic atherosclerosis without aneurysm. No suspicious adenopathy. Reproductive: Status post hysterectomy. No adnexal masses. Other: Negative for free air or free fluid. Small fat containing umbilical hernia. Musculoskeletal: Status post right hip replacement with artifact. Sclerosis and periosteal reaction involving the left inferior pubic ramus and ischial tuberosity corresponding to history of chronic fractures. Postsurgical changes of the lumbar spine L2 through L4. Post augmentation changes at T11 and L1. No definite acute osseous abnormality. IMPRESSION: 1. No CT evidence for acute intra-abdominal or pelvic abnormality. 2. Colon diverticular disease without acute inflammatory change. Aortic Atherosclerosis (ICD10-I70.0). Electronically Signed   By: Maudie Mercury  Francoise Ceo M.D.   On: 09/25/2019 17:36   CT ABDOMEN PELVIS W CONTRAST  Result Date: 09/04/2019 CLINICAL DATA:  Suspected diverticulitis. EXAM: CT ABDOMEN AND PELVIS WITH CONTRAST TECHNIQUE: Multidetector CT imaging of the abdomen and pelvis was performed using the standard protocol following bolus administration of intravenous contrast. CONTRAST:  143mL OMNIPAQUE IOHEXOL 300 MG/ML  SOLN COMPARISON:  05/28/2019 FINDINGS: Lower chest: Resolution of pleural thickening and or pleural fluid seen on the previous exam. Basilar scarring or atelectasis at the LEFT lung base. No consolidation or signs of pleural effusion. Hepatobiliary:  Post cholecystectomy. No suspicious focal hepatic lesion. Stable mild intra and extrahepatic biliary ductal distension. Pancreas: Pancreas is normal without suspicious focal lesion or ductal dilation. No signs of pancreatic inflammation. Spleen: Spleen normal size without focal lesion. Adrenals/Urinary Tract: Adrenal glands are normal. Symmetric renal enhancement without evidence of hydronephrosis. Small low-density lesions in the LEFT kidney are stable. Likely small cysts. Similarly small low-density lesions in the RIGHT kidney without change Stomach/Bowel: Colonic diverticulosis signs of diverticular disease and colonic thickening. Question minimal pericolonic stranding best seen on image 60 of series 8 this is quite subtle and is not associated with pericolonic abscess or obstruction. The appendix is normal. No acute small bowel process. Vascular/Lymphatic: Calcified atheromatous plaque throughout the abdominal aorta, nonaneurysmal. No adenopathy in the retroperitoneum or in the upper abdomen. No pelvic lymphadenopathy. Streak artifact from RIGHT hip arthroplasty limits assessment of the pelvis. Reproductive: Post hysterectomy. No adnexal mass. Small fat containing umbilical hernia. Other: As above. Musculoskeletal: Heterogeneity of the bilateral sacral ala. Post RIGHT hip arthroplasty. Chronic periosteal reaction about the LEFT ischium as before with new site of fracture along the LEFT inferior pubic ramus, now also associated with periosteal reaction. This is new compared to previous imaging. Signs of cement augmentation in vertebral bodies and of spinal fusion. Cement augmentation at T11 and L1 with fusion of L2 through L4 with associated laminectomy similar to prior exams. IMPRESSION: 1. Colonic diverticulosis signs of diverticular disease and colonic thickening. Query mild associated diverticulitis. 2. 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 3. Signs of sacral potential insufficiency fractures with heterogeneity of the bilateral sacral ala. 4. Aortic atherosclerosis. Aortic Atherosclerosis (ICD10-I70.0). Electronically Signed   By: Zetta Bills M.D.   On: 09/04/2019 17:50   DG Chest Port 1 View  Result Date: 09/25/2019 CLINICAL DATA:  Fall with femur fracture. EXAM: PORTABLE CHEST 1 VIEW COMPARISON:  06/19/2019 FINDINGS: Chronic cardiomegaly with left ventricular prominence. Chronic aortic atherosclerosis. Chronic pulmonary scarring left more than right. No sign of active infiltrate, mass, effusion or collapse. No traumatic finding of an acute nature in the region. Old augmented lower thoracic fracture. IMPRESSION: No active cardiopulmonary disease. Chronic pulmonary scarring left worse than right. Electronically Signed   By: Nelson Chimes M.D.   On: 09/25/2019 15:18   DG Knee Complete 4 Views Right  Result Date: 09/26/2019 CLINICAL DATA:  85 year old female with distal right femur fracture, ORIF. EXAM: DG C-ARM 1-60 MIN; RIGHT KNEE - COMPLETE 4+ VIEW FLUOROSCOPY TIME:  Fluoroscopy Time:  0 minutes 50 seconds Radiation Exposure Index (if provided by the fluoroscopic device): 2.2 mGy Number of Acquired Spot Images: 0 COMPARISON:  Right knee CT 09/25/2019. FINDINGS: Seven intraoperative fluoroscopic spot views of the right knee demonstrate placement of medial malleable plate and screws and distal condyle level cannulated screws. Hardware appears intact. Alignment  remains near anatomic. IMPRESSION: Distal right femur ORIF with no adverse features. Electronically Signed   By: Genevie Ann M.D.   On: 09/26/2019 15:42   DG Knee Complete 4 Views Right  Result Date: 09/25/2019 CLINICAL DATA:  Fall, pain EXAM: RIGHT KNEE - COMPLETE 4+ VIEW COMPARISON:  None. FINDINGS: Alignment is anatomic. There is an acute fracture of the distal metaphysis of the femur involving the medial cortex.  Minor displacement. There are changes of osteoarthritis with joint space narrowing and marginal osteophytes, greatest medially. Probable joint effusion. Vascular calcifications noted. IMPRESSION: Acute fracture of the medial cortex of the distal metaphysis of the right femur with minimal displacement. Probable joint effusion. Osteoarthritis. Electronically Signed   By: Macy Mis M.D.   On: 09/25/2019 12:27   DG Knee Right Port  Result Date: 09/27/2019 CLINICAL DATA:  Status post ORIF of distal right femoral fracture. EXAM: PORTABLE RIGHT KNEE - 1-2 VIEW COMPARISON:  Intraoperative films from earlier in the same day. FINDINGS: Fixation sideplate is noted along the lateral aspect of the distal femur. Two large fixation screws are noted traversing the distal aspect of the femur. Fracture fragments are in near anatomic alignment. Small joint effusion is noted. IMPRESSION: ORIF of distal right femoral fracture. Electronically Signed   By: Inez Catalina M.D.   On: 09/27/2019 14:13   DG Foot Complete Right  Result Date: 09/26/2019 CLINICAL DATA:  85 year old female with fall and right knee fracture. Right foot pain. EXAM: RIGHT FOOT COMPLETE - 3+ VIEW COMPARISON:  None. FINDINGS: Portable views including cross-table lateral. Calcaneus and tarsal bones appear intact. Metatarsals appear intact with mild to moderate 1st MTP degeneration and mild hallux valgus. No phalanx fracture identified. Calcified peripheral vascular disease but no discrete soft tissue injury. IMPRESSION: No acute fracture or dislocation identified about the right foot. Electronically Signed   By: Genevie Ann M.D.   On: 09/26/2019 10:08   DG C-Arm 1-60 Min  Result Date: 09/26/2019 CLINICAL DATA:  85 year old female with distal right femur fracture, ORIF. EXAM: DG C-ARM 1-60 MIN; RIGHT KNEE - COMPLETE 4+ VIEW FLUOROSCOPY TIME:  Fluoroscopy Time:  0 minutes 50 seconds Radiation Exposure Index (if provided by the fluoroscopic device): 2.2 mGy  Number of Acquired Spot Images: 0 COMPARISON:  Right knee CT 09/25/2019. FINDINGS: Seven intraoperative fluoroscopic spot views of the right knee demonstrate placement of medial malleable plate and screws and distal condyle level cannulated screws. Hardware appears intact. Alignment remains near anatomic. IMPRESSION: Distal right femur ORIF with no adverse features. Electronically Signed   By: Genevie Ann M.D.   On: 09/26/2019 15:42   DG Hip Unilat W or Wo Pelvis 2-3 Views Right  Result Date: 09/25/2019 CLINICAL DATA:  Pain following recent fall EXAM: DG HIP (WITH OR WITHOUT PELVIS) 2-3V RIGHT COMPARISON:  Aug 11, 2019. FINDINGS: Frontal pelvis as well as frontal and lateral right hip images were obtained. There is a total hip replacement on the right with prosthetic components appearing well-seated. There is a healing fracture of the left ischium with callus formation in this area and bony remodeling. There is underlying osteoporosis. No acute appearing fracture evident. No dislocation. There is moderate narrowing of the left hip joint. There is degenerative change in the visualized lower lumbar spine with postoperative changes. There are multiple foci of arterial vascular calcification. IMPRESSION: Status post total hip replacement right with prosthetic components well-seated. No acute fracture evident.  Healing fracture left ischium. Moderate narrowing left hip joint.  No erosive  change. Bones osteoporotic. Postoperative change as well as degenerative change lower lumbar spine. Electronically Signed   By: Lowella Grip III M.D.   On: 09/25/2019 12:28     TODAY-DAY OF DISCHARGE:  Subjective:   Betty Jackson today has no headache,no chest abdominal pain,no new weakness tingling or numbness, feels much better wants to go home today.   Objective:   Blood pressure (!) 162/61, pulse (!) 56, temperature 98 F (36.7 C), resp. rate 18, height 5' 4.02" (1.626 m), weight 65.3 kg, SpO2 97 %.  Intake/Output  Summary (Last 24 hours) at 09/29/2019 0948 Last data filed at 09/29/2019 0300 Gross per 24 hour  Intake 240 ml  Output 2050 ml  Net -1810 ml   Filed Weights   09/25/19 1113 09/26/19 1058 09/27/19 0342  Weight: 68 kg 68 kg 65.3 kg    Exam: Awake Alert, Oriented *3, No new F.N deficits, Normal affect [place].AT,PERRAL Supple Neck,No JVD, No cervical lymphadenopathy appriciated.  Symmetrical Chest wall movement, Good air movement bilaterally, CTAB RRR,No Gallops,Rubs or new Murmurs, No Parasternal Heave +ve B.Sounds, Abd Soft, Non tender, No organomegaly appriciated, No rebound -guarding or rigidity. No Cyanosis, Clubbing or edema, No new Rash or bruise   PERTINENT RADIOLOGIC STUDIES: DG Knee Right Port  Result Date: 09/27/2019 CLINICAL DATA:  Status post ORIF of distal right femoral fracture. EXAM: PORTABLE RIGHT KNEE - 1-2 VIEW COMPARISON:  Intraoperative films from earlier in the same day. FINDINGS: Fixation sideplate is noted along the lateral aspect of the distal femur. Two large fixation screws are noted traversing the distal aspect of the femur. Fracture fragments are in near anatomic alignment. Small joint effusion is noted. IMPRESSION: ORIF of distal right femoral fracture. Electronically Signed   By: Inez Catalina M.D.   On: 09/27/2019 14:13     PERTINENT LAB RESULTS: CBC: Recent Labs    09/28/19 0502 09/29/19 0352  WBC 20.5* 14.7*  HGB 9.3* 8.6*  HCT 29.4* 28.0*  PLT 195 226   CMET CMP     Component Value Date/Time   NA 140 09/28/2019 0502   NA 141 12/08/2017 1614   K 4.9 09/28/2019 0502   CL 101 09/28/2019 0502   CO2 29 09/28/2019 0502   GLUCOSE 124 (H) 09/28/2019 0502   BUN 19 09/28/2019 0502   BUN 28 (H) 12/08/2017 1614   CREATININE 0.89 09/28/2019 0502   CALCIUM 8.5 (L) 09/28/2019 0502   PROT 6.0 (L) 09/27/2019 0812   PROT 6.2 08/18/2017 1241   ALBUMIN 3.0 (L) 09/27/2019 0812   ALBUMIN 4.3 08/18/2017 1241   AST 18 09/27/2019 0812   ALT 19 09/27/2019 0812     ALKPHOS 74 09/27/2019 0812   BILITOT 0.9 09/27/2019 0812   BILITOT 0.7 08/18/2017 1241   GFRNONAA 60 (L) 09/28/2019 0502   GFRAA >60 09/28/2019 0502    GFR Estimated Creatinine Clearance: 41.4 mL/min (by C-G formula based on SCr of 0.89 mg/dL). No results for input(s): LIPASE, AMYLASE in the last 72 hours. No results for input(s): CKTOTAL, CKMB, CKMBINDEX, TROPONINI in the last 72 hours. Invalid input(s): POCBNP No results for input(s): DDIMER in the last 72 hours. No results for input(s): HGBA1C in the last 72 hours. No results for input(s): CHOL, HDL, LDLCALC, TRIG, CHOLHDL, LDLDIRECT in the last 72 hours. No results for input(s): TSH, T4TOTAL, T3FREE, THYROIDAB in the last 72 hours.  Invalid input(s): FREET3 No results for input(s): VITAMINB12, FOLATE, FERRITIN, TIBC, IRON, RETICCTPCT in the last 72 hours. Coags: No  results for input(s): INR in the last 72 hours.  Invalid input(s): PT Microbiology: Recent Results (from the past 240 hour(s))  SARS Coronavirus 2 by RT PCR (hospital order, performed in [facility] hospital lab) Nasopharyngeal Nasopharyngeal Swab     Status: None   Collection Time: 09/25/19  3:11 PM   Specimen: Nasopharyngeal Swab  Result Value Ref Range Status   SARS Coronavirus 2 NEGATIVE NEGATIVE Final    Comment: (NOTE) SARS-CoV-2 target nucleic acids are NOT DETECTED.  The SARS-CoV-2 RNA is generally detectable in upper and lower respiratory specimens during the acute phase of infection. The lowest concentration of SARS-CoV-2 viral copies this assay can detect is 250 copies / mL. A negative result does not preclude SARS-CoV-2 infection and should not be used as the sole basis for treatment or other patient management decisions.  A negative result may occur with improper specimen collection / handling, submission of specimen other than nasopharyngeal swab, presence of viral mutation(s) within the areas targeted by this assay, and inadequate number of  viral copies (<250 copies / mL). A negative result must be combined with clinical observations, patient history, and epidemiological information.  Fact Sheet for Patients:   StrictlyIdeas.no  Fact Sheet for Healthcare Providers: BankingDealers.co.za  This test is not yet approved or  cleared by the [place] FDA and has been authorized for detection and/or diagnosis of SARS-CoV-2 by FDA under an Emergency Use Authorization (EUA).  This EUA will remain in effect (meaning this test can be used) for the duration of the COVID-19 declaration under Section 564(b)(1) of the Act, 21 U.S.C. section 360bbb-3(b)(1), unless the authorization is terminated or revoked sooner.  Performed at [facility], [address] [place], [place] [address]   MRSA PCR Screening     Status: None   Collection Time: 09/26/19  6:06 AM   Specimen: Nasal Mucosa; Nasopharyngeal  Result Value Ref Range Status   MRSA by PCR NEGATIVE NEGATIVE Final    Comment:        The GeneXpert MRSA Assay (FDA approved for NASAL specimens only), is one component of a comprehensive MRSA colonization surveillance program. It is not intended to diagnose MRSA infection nor to guide or monitor treatment for MRSA infections. Performed at [facility], [place] [address], [place], [place] [address]   SARS CORONAVIRUS 2 (TAT 6-24 HRS) Nasopharyngeal Nasopharyngeal Swab     Status: None   Collection Time: 09/28/19  2:12 PM   Specimen: Nasopharyngeal Swab  Result Value Ref Range Status   SARS Coronavirus 2 NEGATIVE NEGATIVE Final    Comment: (NOTE) SARS-CoV-2 target nucleic acids are NOT DETECTED.  The SARS-CoV-2 RNA is generally detectable in upper and lower respiratory specimens during the acute phase of infection. Negative results do not preclude SARS-CoV-2 infection, do not rule out co-infections with other pathogens, and should not be used as the sole basis  for treatment or other patient management decisions. Negative results must be combined with clinical observations, patient history, and epidemiological information. The expected result is Negative.  Fact Sheet for Patients: SugarRoll.be  Fact Sheet for Healthcare Providers: https://www.woods-mathews.com/  This test is not yet approved or cleared by the [place] FDA and  has been authorized for detection and/or diagnosis of SARS-CoV-2 by FDA under an Emergency Use Authorization (EUA). This EUA will remain  in effect (meaning this test can be used) for the duration of the COVID-19 declaration under Se ction 564(b)(1) of the Act, 21 U.S.C. section  360bbb-3(b)(1), unless the authorization is terminated or revoked sooner.  Performed at [facility], [place] [address], [place], [place] [address]     FURTHER DISCHARGE INSTRUCTIONS:  Get Medicines reviewed and adjusted: Please take all your medications with you for your next visit with your Primary MD  Laboratory/radiological data: Please request your Primary MD to go over all hospital tests and procedure/radiological results at the follow up, please ask your Primary MD to get all Hospital records sent to his/her office.  In some cases, they will be blood work, cultures and biopsy results pending at the time of your discharge. Please request that your primary care M.D. goes through all the records of your hospital data and follows up on these results.  Also Note the following: If you experience worsening of your admission symptoms, develop shortness of breath, life threatening emergency, suicidal or homicidal thoughts you must seek medical attention immediately by calling 911 or calling your MD immediately  if symptoms less severe.  You must read complete instructions/literature along with all the possible adverse reactions/side effects for all the Medicines you take and that have been  prescribed to you. Take any new Medicines after you have completely understood and accpet all the possible adverse reactions/side effects.   Do not drive when taking Pain medications or sleeping medications (Benzodaizepines)  Do not take more than prescribed Pain, Sleep and Anxiety Medications. It is not advisable to combine anxiety,sleep and pain medications without talking with your primary care practitioner  Special Instructions: If you have smoked or chewed Tobacco  in the last 2 yrs please stop smoking, stop any regular Alcohol  and or any Recreational drug use.  Wear Seat belts while driving.  Please note: You were cared for by a hospitalist during your hospital stay. Once you are discharged, your primary care physician will handle any further medical issues. Please note that NO REFILLS for any discharge medications will be authorized once you are discharged, as it is imperative that you return to your primary care physician (or establish a relationship with a primary care physician if you do not have one) for your post hospital discharge needs so that they can reassess your need for medications and monitor your lab values.  Total Time spent coordinating discharge including counseling, education and face to face time equals 35 minutes.  SignedOren Binet 09/29/2019 9:48 AM

## 2019-09-29 NOTE — Consult Note (Signed)
   [facility] CM Inpatient Consult   09/29/2019  LEATHER ESTIS 10-29-1934 [id number]   [facility] [[facility]] Patient:  [employer]   Patient screened for extreme high risk score for unplanned readmission score and for 4 hospitalizations and 5 ED visits in the past 6 months.  Medical record reviewed to check if potential [place] Management service needs.  Review of patient's medical record reveals patient is transitioning to a skilled nursing facility for short term rehab.  Plan:  No current post hospital needs assessed for [facility] Care Management as patient's need will be met at skilled nursing facility.  For questions contact:   [name], RN BSN [facility] Liaison  [phone] business mobile phone Toll free office [phone]  Fax number: [phone] [email or web address]

## 2019-09-30 DIAGNOSIS — D649 Anemia, unspecified: Secondary | ICD-10-CM | POA: Diagnosis not present

## 2019-09-30 DIAGNOSIS — R69 Illness, unspecified: Secondary | ICD-10-CM | POA: Diagnosis not present

## 2019-09-30 DIAGNOSIS — E559 Vitamin D deficiency, unspecified: Secondary | ICD-10-CM | POA: Diagnosis not present

## 2019-10-01 DIAGNOSIS — S7291XA Unspecified fracture of right femur, initial encounter for closed fracture: Secondary | ICD-10-CM | POA: Diagnosis not present

## 2019-10-01 DIAGNOSIS — I4891 Unspecified atrial fibrillation: Secondary | ICD-10-CM | POA: Diagnosis not present

## 2019-10-01 DIAGNOSIS — I2510 Atherosclerotic heart disease of native coronary artery without angina pectoris: Secondary | ICD-10-CM | POA: Diagnosis not present

## 2019-10-01 DIAGNOSIS — D6869 Other thrombophilia: Secondary | ICD-10-CM | POA: Diagnosis not present

## 2019-10-09 NOTE — Brief Op Note (Signed)
#  [id number] 

## 2019-10-10 NOTE — Op Note (Signed)
NAME: Betty Jackson, Betty Jackson. MEDICAL RECORD TR:[id number] ACCOUNT [id number] DATE OF BIRTH:11/12/1934 FACILITY: [facility] LOCATION: [facility] PHYSICIAN:Sanvi Ehler H. Ralpheal Zappone, MD  OPERATIVE REPORT  DATE OF PROCEDURE:  09/26/2019  PREOPERATIVE DIAGNOSIS:  Right medial femoral condyle fracture.  POSTOPERATIVE DIAGNOSIS:  Right medial femoral condyle fracture.    PROCEDURE:  Open reduction internal fixation of right medial femoral condyle.  SURGEON:  Altamese Dundee, MD  ASSISTANT:  Ainsley Spinner, PA-C  ANESTHESIA:  General.  COMPLICATIONS:  None.  TOURNIQUET:  None.  DISPOSITION:  To PACU.  CONDITION:  Stable.  BRIEF SUMMARY FOR PROCEDURE:  The patient is a very pleasant elderly female who sustained a fall resulting in a medial femoral condyle fracture.  The patient has a complex medical history, which includes frequent falls, encephalopathy, giant cell  arteritis, heart and kidney disease, and others.  The risks and benefits of surgical repair versus nonoperative treatment and the potential for displacement and loss of ambulation were discussed with the patient and her daughter.  After acknowledging  these risks, they did wish to proceed.  BRIEF SUMMARY OF PROCEDURE:  The patient was taken to the operating room where general anesthesia was induced.  The right lower extremity was prepped and draped in the usual sterile fashion following chlorhexidine wash, Betadine scrub and paint.  Timeout  was held.  C-arm was brought in to identify the correct starting point where 2 partially threaded cannulated screws were placed through percutaneous incisions after first placing a bump and applying distraction with slight valgus to reduce the fracture.   Because of the vertical shear component exiting through the medial cortex an additional 4 cm incision was made.  Dissection was carried carefully down to the medial cortex anterior to the adductor tendon and reflecting some of the medialis anteriorly.   I was able to  identify the fracture line and contour a 4-hole recon plate from the [employer] small fragment system, placing a K-wire for provisional stabilization and I checked this on both AP and lateral views.  I then secured bicortical fixation  immediately above the fracture to compress this buttress plate securely against the medial cortex and prevent migration of the fracture site.  Wound was irrigated thoroughly and then closed in standard layered fashion using Vicryl and 2-0 nylon.  Sterile  gently compressive was applied.  The patient was taken to the PACU in stable condition.  PROGNOSIS:  The patient will have unrestricted range of motion but will be nonweightbearing.  We will transition her into a hinged knee brace for additional support.  She will be on pharmacologic prophylaxis while in the hospital as deemed appropriate by  the primary service, but would not anticipate this long term given her history of encephalopathy and multiple falls.  CN/NUANCE  D:10/09/2019 T:10/10/2019 JOB:[id number]

## 2019-10-16 DIAGNOSIS — M25532 Pain in left wrist: Secondary | ICD-10-CM | POA: Diagnosis not present

## 2019-10-16 DIAGNOSIS — S72431D Displaced fracture of medial condyle of right femur, subsequent encounter for closed fracture with routine healing: Secondary | ICD-10-CM | POA: Diagnosis not present

## 2019-10-17 ENCOUNTER — Inpatient Hospital Stay (HOSPITAL_COMMUNITY)
Admission: EM | Admit: 2019-10-17 | Discharge: 2019-10-23 | DRG: 481 | Disposition: A | Discharge status: Skilled Nursing Facility | Payer: Medicare Other | Source: Skilled Nursing Facility | Attending: Internal Medicine | Admitting: Internal Medicine

## 2019-10-17 ENCOUNTER — Encounter (HOSPITAL_COMMUNITY)
Admission: EM | Disposition: A | Discharge status: Skilled Nursing Facility | Payer: Self-pay | Source: Skilled Nursing Facility | Attending: Internal Medicine

## 2019-10-17 ENCOUNTER — Inpatient Hospital Stay (HOSPITAL_COMMUNITY): Payer: Medicare Other

## 2019-10-17 ENCOUNTER — Emergency Department (HOSPITAL_COMMUNITY): Payer: Medicare Other

## 2019-10-17 ENCOUNTER — Inpatient Hospital Stay (HOSPITAL_COMMUNITY): Payer: Medicare Other | Admitting: Certified Registered Nurse Anesthetist

## 2019-10-17 ENCOUNTER — Encounter (HOSPITAL_COMMUNITY): Payer: Self-pay | Admitting: Emergency Medicine

## 2019-10-17 DIAGNOSIS — S199XXA Unspecified injury of neck, initial encounter: Secondary | ICD-10-CM | POA: Diagnosis not present

## 2019-10-17 DIAGNOSIS — M79604 Pain in right leg: Secondary | ICD-10-CM | POA: Diagnosis not present

## 2019-10-17 DIAGNOSIS — Z419 Encounter for procedure for purposes other than remedying health state, unspecified: Secondary | ICD-10-CM

## 2019-10-17 DIAGNOSIS — S72491D Other fracture of lower end of right femur, subsequent encounter for closed fracture with routine healing: Secondary | ICD-10-CM | POA: Diagnosis not present

## 2019-10-17 DIAGNOSIS — I10 Essential (primary) hypertension: Secondary | ICD-10-CM | POA: Diagnosis not present

## 2019-10-17 DIAGNOSIS — K219 Gastro-esophageal reflux disease without esophagitis: Secondary | ICD-10-CM | POA: Diagnosis not present

## 2019-10-17 DIAGNOSIS — Z96641 Presence of right artificial hip joint: Secondary | ICD-10-CM | POA: Diagnosis not present

## 2019-10-17 DIAGNOSIS — E785 Hyperlipidemia, unspecified: Secondary | ICD-10-CM | POA: Diagnosis present

## 2019-10-17 DIAGNOSIS — G4700 Insomnia, unspecified: Secondary | ICD-10-CM | POA: Diagnosis present

## 2019-10-17 DIAGNOSIS — Z66 Do not resuscitate: Secondary | ICD-10-CM | POA: Diagnosis not present

## 2019-10-17 DIAGNOSIS — E8889 Other specified metabolic disorders: Secondary | ICD-10-CM | POA: Diagnosis present

## 2019-10-17 DIAGNOSIS — D62 Acute posthemorrhagic anemia: Secondary | ICD-10-CM | POA: Diagnosis not present

## 2019-10-17 DIAGNOSIS — I130 Hypertensive heart and chronic kidney disease with heart failure and stage 1 through stage 4 chronic kidney disease, or unspecified chronic kidney disease: Secondary | ICD-10-CM | POA: Diagnosis not present

## 2019-10-17 DIAGNOSIS — M2550 Pain in unspecified joint: Secondary | ICD-10-CM | POA: Diagnosis not present

## 2019-10-17 DIAGNOSIS — Z79899 Other long term (current) drug therapy: Secondary | ICD-10-CM

## 2019-10-17 DIAGNOSIS — S72401A Unspecified fracture of lower end of right femur, initial encounter for closed fracture: Secondary | ICD-10-CM

## 2019-10-17 DIAGNOSIS — W19XXXA Unspecified fall, initial encounter: Secondary | ICD-10-CM | POA: Diagnosis not present

## 2019-10-17 DIAGNOSIS — Z955 Presence of coronary angioplasty implant and graft: Secondary | ICD-10-CM | POA: Diagnosis not present

## 2019-10-17 DIAGNOSIS — M810 Age-related osteoporosis without current pathological fracture: Secondary | ICD-10-CM | POA: Diagnosis present

## 2019-10-17 DIAGNOSIS — I2510 Atherosclerotic heart disease of native coronary artery without angina pectoris: Secondary | ICD-10-CM | POA: Diagnosis not present

## 2019-10-17 DIAGNOSIS — N179 Acute kidney failure, unspecified: Secondary | ICD-10-CM | POA: Diagnosis not present

## 2019-10-17 DIAGNOSIS — I480 Paroxysmal atrial fibrillation: Secondary | ICD-10-CM | POA: Diagnosis present

## 2019-10-17 DIAGNOSIS — T84124A Displacement of internal fixation device of right femur, initial encounter: Principal | ICD-10-CM | POA: Diagnosis present

## 2019-10-17 DIAGNOSIS — M069 Rheumatoid arthritis, unspecified: Secondary | ICD-10-CM | POA: Diagnosis not present

## 2019-10-17 DIAGNOSIS — R6889 Other general symptoms and signs: Secondary | ICD-10-CM | POA: Diagnosis not present

## 2019-10-17 DIAGNOSIS — Z8673 Personal history of transient ischemic attack (TIA), and cerebral infarction without residual deficits: Secondary | ICD-10-CM

## 2019-10-17 DIAGNOSIS — R0902 Hypoxemia: Secondary | ICD-10-CM | POA: Diagnosis not present

## 2019-10-17 DIAGNOSIS — Z7952 Long term (current) use of systemic steroids: Secondary | ICD-10-CM | POA: Diagnosis not present

## 2019-10-17 DIAGNOSIS — I5032 Chronic diastolic (congestive) heart failure: Secondary | ICD-10-CM | POA: Diagnosis not present

## 2019-10-17 DIAGNOSIS — S72491A Other fracture of lower end of right femur, initial encounter for closed fracture: Secondary | ICD-10-CM | POA: Diagnosis not present

## 2019-10-17 DIAGNOSIS — N1831 Chronic kidney disease, stage 3a: Secondary | ICD-10-CM | POA: Diagnosis not present

## 2019-10-17 DIAGNOSIS — S7291XA Unspecified fracture of right femur, initial encounter for closed fracture: Secondary | ICD-10-CM | POA: Diagnosis not present

## 2019-10-17 DIAGNOSIS — W06XXXA Fall from bed, initial encounter: Secondary | ICD-10-CM | POA: Diagnosis present

## 2019-10-17 DIAGNOSIS — Y92129 Unspecified place in nursing home as the place of occurrence of the external cause: Secondary | ICD-10-CM | POA: Diagnosis not present

## 2019-10-17 DIAGNOSIS — S79929A Unspecified injury of unspecified thigh, initial encounter: Secondary | ICD-10-CM | POA: Diagnosis not present

## 2019-10-17 DIAGNOSIS — D631 Anemia in chronic kidney disease: Secondary | ICD-10-CM | POA: Diagnosis not present

## 2019-10-17 DIAGNOSIS — Z01818 Encounter for other preprocedural examination: Secondary | ICD-10-CM

## 2019-10-17 DIAGNOSIS — N183 Chronic kidney disease, stage 3 unspecified: Secondary | ICD-10-CM | POA: Diagnosis not present

## 2019-10-17 DIAGNOSIS — M316 Other giant cell arteritis: Secondary | ICD-10-CM | POA: Diagnosis not present

## 2019-10-17 DIAGNOSIS — Z7401 Bed confinement status: Secondary | ICD-10-CM | POA: Diagnosis not present

## 2019-10-17 DIAGNOSIS — Z8249 Family history of ischemic heart disease and other diseases of the circulatory system: Secondary | ICD-10-CM

## 2019-10-17 DIAGNOSIS — Z20822 Contact with and (suspected) exposure to covid-19: Secondary | ICD-10-CM | POA: Diagnosis not present

## 2019-10-17 DIAGNOSIS — L89612 Pressure ulcer of right heel, stage 2: Secondary | ICD-10-CM | POA: Diagnosis not present

## 2019-10-17 DIAGNOSIS — S72451A Displaced supracondylar fracture without intracondylar extension of lower end of right femur, initial encounter for closed fracture: Secondary | ICD-10-CM | POA: Diagnosis present

## 2019-10-17 DIAGNOSIS — Z743 Need for continuous supervision: Secondary | ICD-10-CM | POA: Diagnosis not present

## 2019-10-17 DIAGNOSIS — F331 Major depressive disorder, recurrent, moderate: Secondary | ICD-10-CM | POA: Diagnosis present

## 2019-10-17 DIAGNOSIS — S32592D Other specified fracture of left pubis, subsequent encounter for fracture with routine healing: Secondary | ICD-10-CM | POA: Diagnosis not present

## 2019-10-17 DIAGNOSIS — S72401D Unspecified fracture of lower end of right femur, subsequent encounter for closed fracture with routine healing: Secondary | ICD-10-CM | POA: Diagnosis not present

## 2019-10-17 DIAGNOSIS — T148XXA Other injury of unspecified body region, initial encounter: Secondary | ICD-10-CM

## 2019-10-17 DIAGNOSIS — S0990XA Unspecified injury of head, initial encounter: Secondary | ICD-10-CM | POA: Diagnosis not present

## 2019-10-17 DIAGNOSIS — L8990 Pressure ulcer of unspecified site, unspecified stage: Secondary | ICD-10-CM | POA: Insufficient documentation

## 2019-10-17 DIAGNOSIS — R918 Other nonspecific abnormal finding of lung field: Secondary | ICD-10-CM | POA: Diagnosis not present

## 2019-10-17 DIAGNOSIS — T84498A Other mechanical complication of other internal orthopedic devices, implants and grafts, initial encounter: Secondary | ICD-10-CM | POA: Diagnosis not present

## 2019-10-17 DIAGNOSIS — M25551 Pain in right hip: Secondary | ICD-10-CM | POA: Diagnosis not present

## 2019-10-17 HISTORY — PX: ORIF FEMUR FRACTURE: SHX2119

## 2019-10-17 LAB — I-STAT CHEM 8, ED
BUN: 39 mg/dL — ABNORMAL HIGH (ref 8–23)
Calcium, Ion: 1.1 mmol/L — ABNORMAL LOW (ref 1.15–1.40)
Chloride: 99 mmol/L (ref 98–111)
Creatinine, Ser: 1.3 mg/dL — ABNORMAL HIGH (ref 0.44–1.00)
Glucose, Bld: 96 mg/dL (ref 70–99)
HCT: 35 % — ABNORMAL LOW (ref 36.0–46.0)
Hemoglobin: 11.9 g/dL — ABNORMAL LOW (ref 12.0–15.0)
Potassium: 4.3 mmol/L (ref 3.5–5.1)
Sodium: 139 mmol/L (ref 135–145)
TCO2: 29 mmol/L (ref 22–32)

## 2019-10-17 LAB — CBC WITH DIFFERENTIAL/PLATELET
Abs Immature Granulocytes: 0.09 10*3/uL — ABNORMAL HIGH (ref 0.00–0.07)
Basophils Absolute: 0 10*3/uL (ref 0.0–0.1)
Basophils Relative: 0 %
Eosinophils Absolute: 0 10*3/uL (ref 0.0–0.5)
Eosinophils Relative: 0 %
HCT: 35.6 % — ABNORMAL LOW (ref 36.0–46.0)
Hemoglobin: 11 g/dL — ABNORMAL LOW (ref 12.0–15.0)
Immature Granulocytes: 1 %
Lymphocytes Relative: 19 %
Lymphs Abs: 2.2 10*3/uL (ref 0.7–4.0)
MCH: 29.2 pg (ref 26.0–34.0)
MCHC: 30.9 g/dL (ref 30.0–36.0)
MCV: 94.4 fL (ref 80.0–100.0)
Monocytes Absolute: 1 10*3/uL (ref 0.1–1.0)
Monocytes Relative: 9 %
Neutro Abs: 8 10*3/uL — ABNORMAL HIGH (ref 1.7–7.7)
Neutrophils Relative %: 71 %
Platelets: 239 10*3/uL (ref 150–400)
RBC: 3.77 MIL/uL — ABNORMAL LOW (ref 3.87–5.11)
RDW: 15.5 % (ref 11.5–15.5)
WBC: 11.3 10*3/uL — ABNORMAL HIGH (ref 4.0–10.5)
nRBC: 0 % (ref 0.0–0.2)

## 2019-10-17 LAB — SURGICAL PCR SCREEN
MRSA, PCR: NEGATIVE
Staphylococcus aureus: NEGATIVE

## 2019-10-17 LAB — SARS CORONAVIRUS 2 BY RT PCR (HOSPITAL ORDER, PERFORMED IN ~~LOC~~ HOSPITAL LAB): SARS Coronavirus 2: NEGATIVE

## 2019-10-17 LAB — CREATININE, SERUM
Creatinine, Ser: 0.84 mg/dL (ref 0.44–1.00)
GFR calc Af Amer: >60 mL/min (ref >60)
GFR calc non Af Amer: >60 mL/min (ref >60)

## 2019-10-17 SURGERY — OPEN REDUCTION INTERNAL FIXATION (ORIF) DISTAL FEMUR FRACTURE
Anesthesia: General | Site: Knee | Laterality: Right

## 2019-10-17 MED ORDER — ACETAMINOPHEN 10 MG/ML IV SOLN
INTRAVENOUS | Status: AC
  Administered 2019-10-17: 1000 mg via INTRAVENOUS
  Filled 2019-10-17: qty 100

## 2019-10-17 MED ORDER — PHENYLEPHRINE HCL-NACL 10-0.9 MG/250ML-% IV SOLN
INTRAVENOUS | Status: DC | PRN
  Administered 2019-10-17: 25 ug/min via INTRAVENOUS

## 2019-10-17 MED ORDER — CEFAZOLIN SODIUM-DEXTROSE 2-3 GM-%(50ML) IV SOLR
INTRAVENOUS | Status: DC | PRN
Start: 2019-10-17 — End: 2019-10-17
  Administered 2019-10-17: 2 g via INTRAVENOUS

## 2019-10-17 MED ORDER — SENNOSIDES-DOCUSATE SODIUM 8.6-50 MG PO TABS
1.0000 | ORAL_TABLET | Freq: Every day | ORAL | Status: DC
  Administered 2019-10-17 – 2019-10-22 (×6): 1 via ORAL
  Filled 2019-10-17 (×6): qty 1

## 2019-10-17 MED ORDER — ENOXAPARIN SODIUM 30 MG/0.3ML ~~LOC~~ SOLN
30.0000 mg | SUBCUTANEOUS | Status: DC
  Administered 2019-10-18 – 2019-10-23 (×6): 30 mg via SUBCUTANEOUS
  Filled 2019-10-17 (×6): qty 0.3

## 2019-10-17 MED ORDER — METOCLOPRAMIDE HCL 5 MG/ML IJ SOLN: 5.0000 mg | Freq: Three times a day (TID) | INTRAMUSCULAR | Status: DC | PRN

## 2019-10-17 MED ORDER — PROPOFOL 10 MG/ML IV BOLUS
INTRAVENOUS | Status: DC | PRN
  Administered 2019-10-17: 80 mg via INTRAVENOUS

## 2019-10-17 MED ORDER — POTASSIUM CHLORIDE CRYS ER 10 MEQ PO TBCR
10.0000 meq | EXTENDED_RELEASE_TABLET | Freq: Every day | ORAL | Status: DC
  Administered 2019-10-18 – 2019-10-23 (×6): 10 meq via ORAL
  Filled 2019-10-17 (×6): qty 1

## 2019-10-17 MED ORDER — MENTHOL 3 MG MT LOZG: 1.0000 | LOZENGE | OROMUCOSAL | Status: DC | PRN

## 2019-10-17 MED ORDER — SUGAMMADEX SODIUM 200 MG/2ML IV SOLN
INTRAVENOUS | Status: DC | PRN
  Administered 2019-10-17: 200 mg via INTRAVENOUS

## 2019-10-17 MED ORDER — FENTANYL 12 MCG/HR TD PT72
1.0000 | MEDICATED_PATCH | TRANSDERMAL | Status: DC
  Administered 2019-10-18 – 2019-10-21 (×2): 1 via TRANSDERMAL
  Filled 2019-10-17 (×2): qty 1

## 2019-10-17 MED ORDER — LACTATED RINGERS IV SOLN: INTRAVENOUS | Status: DC | PRN

## 2019-10-17 MED ORDER — PHENYLEPHRINE HCL (PRESSORS) 10 MG/ML IV SOLN
INTRAVENOUS | Status: DC | PRN
  Administered 2019-10-17 (×2): 80 ug via INTRAVENOUS

## 2019-10-17 MED ORDER — ALPRAZOLAM 0.5 MG PO TABS
0.5000 mg | ORAL_TABLET | Freq: Every evening | ORAL | Status: DC | PRN
  Administered 2019-10-17: 0.5 mg via ORAL
  Filled 2019-10-17: qty 2

## 2019-10-17 MED ORDER — LIDOCAINE 2% (20 MG/ML) 5 ML SYRINGE
INTRAMUSCULAR | Status: DC | PRN
  Administered 2019-10-17: 60 mg via INTRAVENOUS

## 2019-10-17 MED ORDER — METOCLOPRAMIDE HCL 5 MG PO TABS: 5.0000 mg | ORAL_TABLET | Freq: Three times a day (TID) | ORAL | Status: DC | PRN

## 2019-10-17 MED ORDER — FENTANYL CITRATE (PF) 250 MCG/5ML IJ SOLN
INTRAMUSCULAR | Status: AC
  Filled 2019-10-17: qty 5

## 2019-10-17 MED ORDER — METOPROLOL TARTRATE 50 MG PO TABS
50.0000 mg | ORAL_TABLET | Freq: Two times a day (BID) | ORAL | Status: DC
  Administered 2019-10-17 – 2019-10-23 (×12): 50 mg via ORAL
  Filled 2019-10-17 (×3): qty 1
  Filled 2019-10-17: qty 2
  Filled 2019-10-17 (×9): qty 1

## 2019-10-17 MED ORDER — MORPHINE SULFATE (PF) 2 MG/ML IV SOLN
0.5000 mg | INTRAVENOUS | Status: DC | PRN
  Administered 2019-10-17 – 2019-10-19 (×2): 0.5 mg via INTRAVENOUS
  Filled 2019-10-17 (×3): qty 1

## 2019-10-17 MED ORDER — ESCITALOPRAM OXALATE 10 MG PO TABS
10.0000 mg | ORAL_TABLET | Freq: Every day | ORAL | Status: DC
  Administered 2019-10-17 – 2019-10-23 (×7): 10 mg via ORAL
  Filled 2019-10-17 (×7): qty 1

## 2019-10-17 MED ORDER — BUPROPION HCL ER (SR) 150 MG PO TB12: 150.0000 mg | ORAL_TABLET | Freq: Every day | ORAL | Status: DC

## 2019-10-17 MED ORDER — ONDANSETRON HCL 4 MG/2ML IJ SOLN
INTRAMUSCULAR | Status: DC | PRN
  Administered 2019-10-17: 4 mg via INTRAVENOUS

## 2019-10-17 MED ORDER — DOCUSATE SODIUM 100 MG PO CAPS
100.0000 mg | ORAL_CAPSULE | Freq: Two times a day (BID) | ORAL | Status: DC
  Administered 2019-10-17 – 2019-10-22 (×11): 100 mg via ORAL
  Filled 2019-10-17 (×12): qty 1

## 2019-10-17 MED ORDER — FAMOTIDINE 20 MG PO TABS
20.0000 mg | ORAL_TABLET | Freq: Every day | ORAL | Status: DC
  Administered 2019-10-17 – 2019-10-23 (×7): 20 mg via ORAL
  Filled 2019-10-17 (×7): qty 1

## 2019-10-17 MED ORDER — FENTANYL CITRATE (PF) 100 MCG/2ML IJ SOLN
50.0000 ug | Freq: Once | INTRAMUSCULAR | Status: AC
  Administered 2019-10-17: 50 ug via INTRAVENOUS
  Filled 2019-10-17: qty 2

## 2019-10-17 MED ORDER — ACETAMINOPHEN 10 MG/ML IV SOLN: 1000.0000 mg | Freq: Once | INTRAVENOUS | Status: AC

## 2019-10-17 MED ORDER — FENTANYL CITRATE (PF) 100 MCG/2ML IJ SOLN: 25.0000 ug | INTRAMUSCULAR | Status: DC | PRN

## 2019-10-17 MED ORDER — ONDANSETRON HCL 4 MG/2ML IJ SOLN: 4.0000 mg | Freq: Four times a day (QID) | INTRAMUSCULAR | Status: DC | PRN

## 2019-10-17 MED ORDER — OXYCODONE HCL 5 MG/5ML PO SOLN: 5.0000 mg | Freq: Once | ORAL | Status: DC | PRN

## 2019-10-17 MED ORDER — PREDNISONE 10 MG PO TABS
10.0000 mg | ORAL_TABLET | Freq: Every day | ORAL | Status: DC
  Administered 2019-10-17 – 2019-10-23 (×7): 10 mg via ORAL
  Filled 2019-10-17 (×7): qty 1

## 2019-10-17 MED ORDER — FENTANYL CITRATE (PF) 100 MCG/2ML IJ SOLN
50.0000 ug | Freq: Once | INTRAMUSCULAR | Status: AC
  Administered 2019-10-17: 50 ug via INTRAVENOUS

## 2019-10-17 MED ORDER — SODIUM CHLORIDE 0.9 % IV SOLN: INTRAVENOUS | Status: DC

## 2019-10-17 MED ORDER — HYDROCODONE-ACETAMINOPHEN 5-325 MG PO TABS
1.0000 | ORAL_TABLET | Freq: Four times a day (QID) | ORAL | Status: DC | PRN
  Administered 2019-10-17 – 2019-10-18 (×2): 2 via ORAL
  Administered 2019-10-18: 1 via ORAL
  Administered 2019-10-19 (×2): 2 via ORAL
  Administered 2019-10-20 – 2019-10-22 (×7): 1 via ORAL
  Filled 2019-10-17: qty 1
  Filled 2019-10-17: qty 2
  Filled 2019-10-17 (×7): qty 1
  Filled 2019-10-17 (×3): qty 2

## 2019-10-17 MED ORDER — FUROSEMIDE 20 MG PO TABS: 40.0000 mg | ORAL_TABLET | Freq: Every day | ORAL | Status: DC

## 2019-10-17 MED ORDER — DILTIAZEM HCL ER COATED BEADS 120 MG PO CP24
240.0000 mg | ORAL_CAPSULE | Freq: Every day | ORAL | Status: DC
  Administered 2019-10-17 – 2019-10-23 (×7): 240 mg via ORAL
  Filled 2019-10-17 (×5): qty 2
  Filled 2019-10-17: qty 1
  Filled 2019-10-17 (×2): qty 2

## 2019-10-17 MED ORDER — PROMETHAZINE HCL 25 MG/ML IJ SOLN: 6.2500 mg | INTRAMUSCULAR | Status: DC | PRN

## 2019-10-17 MED ORDER — FENTANYL CITRATE (PF) 100 MCG/2ML IJ SOLN
INTRAMUSCULAR | Status: AC
  Filled 2019-10-17: qty 2

## 2019-10-17 MED ORDER — PHENOL 1.4 % MT LIQD: 1.0000 | OROMUCOSAL | Status: DC | PRN

## 2019-10-17 MED ORDER — FERROUS SULFATE 325 (65 FE) MG PO TABS
325.0000 mg | ORAL_TABLET | Freq: Two times a day (BID) | ORAL | Status: DC
  Administered 2019-10-17 – 2019-10-23 (×12): 325 mg via ORAL
  Filled 2019-10-17 (×11): qty 1

## 2019-10-17 MED ORDER — CEFAZOLIN SODIUM-DEXTROSE 2-4 GM/100ML-% IV SOLN
2.0000 g | Freq: Four times a day (QID) | INTRAVENOUS | Status: AC
  Administered 2019-10-17 – 2019-10-18 (×2): 2 g via INTRAVENOUS
  Filled 2019-10-17 (×2): qty 100

## 2019-10-17 MED ORDER — FENTANYL CITRATE (PF) 250 MCG/5ML IJ SOLN
INTRAMUSCULAR | Status: DC | PRN
  Administered 2019-10-17 (×2): 50 ug via INTRAVENOUS
  Administered 2019-10-17: 25 ug via INTRAVENOUS
  Administered 2019-10-17 (×2): 50 ug via INTRAVENOUS
  Administered 2019-10-17: 25 ug via INTRAVENOUS
  Administered 2019-10-17 (×2): 50 ug via INTRAVENOUS

## 2019-10-17 MED ORDER — ROCURONIUM BROMIDE 10 MG/ML (PF) SYRINGE
PREFILLED_SYRINGE | INTRAVENOUS | Status: DC | PRN
  Administered 2019-10-17: 50 mg via INTRAVENOUS
  Administered 2019-10-17 (×2): 10 mg via INTRAVENOUS

## 2019-10-17 MED ORDER — PROPOFOL 10 MG/ML IV BOLUS
INTRAVENOUS | Status: AC
  Filled 2019-10-17: qty 20

## 2019-10-17 MED ORDER — ONDANSETRON HCL 4 MG PO TABS: 4.0000 mg | ORAL_TABLET | Freq: Four times a day (QID) | ORAL | Status: DC | PRN

## 2019-10-17 MED ORDER — OXYCODONE HCL 5 MG PO TABS: 5.0000 mg | ORAL_TABLET | Freq: Once | ORAL | Status: DC | PRN

## 2019-10-17 MED ORDER — 0.9 % SODIUM CHLORIDE (POUR BTL) OPTIME
TOPICAL | Status: DC | PRN
  Administered 2019-10-17: 1000 mL

## 2019-10-17 MED ORDER — ALBUMIN HUMAN 5 % IV SOLN
INTRAVENOUS | Status: DC | PRN
Start: 2019-10-17 — End: 2019-10-17

## 2019-10-17 MED ORDER — DEXAMETHASONE SODIUM PHOSPHATE 10 MG/ML IJ SOLN
INTRAMUSCULAR | Status: DC | PRN
  Administered 2019-10-17: 10 mg via INTRAVENOUS

## 2019-10-17 SURGICAL SUPPLY — 79 items
BIT DRILL 4.3 (BIT) ×2
BIT DRILL 4.3MM (BIT) ×1
BIT DRILL 4.3X300MM (BIT) ×1 IMPLANT
BIT DRILL LONG 3.3 (BIT) ×4 IMPLANT
BIT DRILL LONG 3.3MM (BIT) ×2
BIT DRILL QC 3.3X195 (BIT) ×3 IMPLANT
BLADE CLIPPER SURG (BLADE) IMPLANT
BNDG ELASTIC 4X5.8 VLCR STR LF (GAUZE/BANDAGES/DRESSINGS) ×3 IMPLANT
BNDG ELASTIC 6X5.8 VLCR STR LF (GAUZE/BANDAGES/DRESSINGS) ×3 IMPLANT
BNDG GAUZE ELAST 4 BULKY (GAUZE/BANDAGES/DRESSINGS) ×3 IMPLANT
BRUSH SCRUB EZ PLAIN DRY (MISCELLANEOUS) ×6 IMPLANT
CANISTER SUCT 3000ML PPV (MISCELLANEOUS) ×3 IMPLANT
CAP LOCK NCB (Cap) ×30 IMPLANT
COVER SURGICAL LIGHT HANDLE (MISCELLANEOUS) ×3 IMPLANT
COVER WAND RF STERILE (DRAPES) IMPLANT
DRAPE C-ARM 42X72 X-RAY (DRAPES) ×3 IMPLANT
DRAPE C-ARMOR (DRAPES) ×3 IMPLANT
DRAPE IMP U-DRAPE 54X76 (DRAPES) IMPLANT
DRAPE ORTHO SPLIT 77X108 STRL (DRAPES) ×6
DRAPE SURG ORHT 6 SPLT 77X108 (DRAPES) ×2 IMPLANT
DRAPE U-SHAPE 47X51 STRL (DRAPES) ×3 IMPLANT
DRSG ADAPTIC 3X8 NADH LF (GAUZE/BANDAGES/DRESSINGS) IMPLANT
DRSG MEPILEX BORDER 4X12 (GAUZE/BANDAGES/DRESSINGS) ×3 IMPLANT
DRSG MEPILEX BORDER 4X8 (GAUZE/BANDAGES/DRESSINGS) ×6 IMPLANT
DRSG PAD ABDOMINAL 8X10 ST (GAUZE/BANDAGES/DRESSINGS) IMPLANT
ELECT REM PT RETURN 9FT ADLT (ELECTROSURGICAL) ×3
ELECTRODE REM PT RTRN 9FT ADLT (ELECTROSURGICAL) ×1 IMPLANT
EVACUATOR 1/8 PVC DRAIN (DRAIN) IMPLANT
EVACUATOR 3/16  PVC DRAIN (DRAIN)
EVACUATOR 3/16 PVC DRAIN (DRAIN) IMPLANT
GAUZE SPONGE 4X4 12PLY STRL (GAUZE/BANDAGES/DRESSINGS) IMPLANT
GLOVE BIO SURGEON STRL SZ7.5 (GLOVE) ×3 IMPLANT
GLOVE BIO SURGEON STRL SZ8 (GLOVE) ×6 IMPLANT
GLOVE BIOGEL PI IND STRL 7.5 (GLOVE) ×1 IMPLANT
GLOVE BIOGEL PI IND STRL 8 (GLOVE) ×1 IMPLANT
GLOVE BIOGEL PI INDICATOR 7.5 (GLOVE) ×2
GLOVE BIOGEL PI INDICATOR 8 (GLOVE) ×2
GOWN STRL REUS W/ TWL LRG LVL3 (GOWN DISPOSABLE) ×2 IMPLANT
GOWN STRL REUS W/ TWL XL LVL3 (GOWN DISPOSABLE) ×1 IMPLANT
GOWN STRL REUS W/TWL LRG LVL3 (GOWN DISPOSABLE) ×6
GOWN STRL REUS W/TWL XL LVL3 (GOWN DISPOSABLE) ×3
K-WIRE 2.0 (WIRE) ×3
K-WIRE FXSTD 280X2XNS SS (WIRE) ×1
KIT BASIN OR (CUSTOM PROCEDURE TRAY) ×3 IMPLANT
KIT TURNOVER KIT B (KITS) ×3 IMPLANT
KWIRE FXSTD 280X2XNS SS (WIRE) ×1 IMPLANT
NEEDLE 22X1 1/2 (OR ONLY) (NEEDLE) IMPLANT
NS IRRIG 1000ML POUR BTL (IV SOLUTION) ×3 IMPLANT
PACK TOTAL JOINT (CUSTOM PROCEDURE TRAY) ×3 IMPLANT
PACK UNIVERSAL I (CUSTOM PROCEDURE TRAY) IMPLANT
PAD ARMBOARD 7.5X6 YLW CONV (MISCELLANEOUS) ×6 IMPLANT
PAD CAST 4YDX4 CTTN HI CHSV (CAST SUPPLIES) ×1 IMPLANT
PADDING CAST COTTON 4X4 STRL (CAST SUPPLIES) ×3
PADDING CAST COTTON 6X4 STRL (CAST SUPPLIES) ×3 IMPLANT
PLATE DISTAL FEMUR 15H 317M RT (Plate) ×3 IMPLANT
SCREW 5.0 80MM (Screw) ×9 IMPLANT
SCREW NCB 4.0MX34M (Screw) ×6 IMPLANT
SCREW NCB 4.0MX38M (Screw) ×3 IMPLANT
SCREW NCB 5.0X34MM (Screw) ×6 IMPLANT
SCREW NCB 5.0X85MM (Screw) ×6 IMPLANT
SCREW UNI CORTICAL 5.0X14MM (Screw) ×3 IMPLANT
SCREW UNICORTICAL 5.0X14 (Screw) ×1 IMPLANT
SCREW UNICORTICAL NCB 5.0X20 (Screw) ×3 IMPLANT
SPONGE LAP 18X18 RF (DISPOSABLE) IMPLANT
STAPLER VISISTAT 35W (STAPLE) ×3 IMPLANT
SUCTION FRAZIER HANDLE 10FR (MISCELLANEOUS) ×2
SUCTION TUBE FRAZIER 10FR DISP (MISCELLANEOUS) ×1 IMPLANT
SUT ETHILON 2 0 FS 18 (SUTURE) ×9 IMPLANT
SUT PROLENE 0 CT 2 (SUTURE) ×6 IMPLANT
SUT VIC AB 0 CT1 27 (SUTURE)
SUT VIC AB 0 CT1 27XBRD ANBCTR (SUTURE) IMPLANT
SUT VIC AB 1 CT1 27 (SUTURE) ×3
SUT VIC AB 1 CT1 27XBRD ANBCTR (SUTURE) ×1 IMPLANT
SUT VIC AB 2-0 CT1 27 (SUTURE) ×3
SUT VIC AB 2-0 CT1 TAPERPNT 27 (SUTURE) ×1 IMPLANT
SYR 20ML ECCENTRIC (SYRINGE) IMPLANT
TOWEL GREEN STERILE (TOWEL DISPOSABLE) ×6 IMPLANT
TOWEL GREEN STERILE FF (TOWEL DISPOSABLE) ×3 IMPLANT
TRAY FOLEY MTR SLVR 16FR STAT (SET/KITS/TRAYS/PACK) IMPLANT

## 2019-10-17 NOTE — Consult Note (Signed)
Reason for Consult:Right femur fx Referring Physician: B Dahal  Betty Jackson is an 85 y.o. female.  HPI: Betty Jackson was at home and fell in the bathroom. She had immediate right leg pain and could not get up. She is 2 weeks s/p ORIF of that same leg. X-rays showed a periprosthetic distal femur fx and orthopedic surgery was consulted. She c/o localized pain to the area.  Past Medical History:  Diagnosis Date   Acute respiratory failure with hypoxia ([place]) 05/20/2017   Anemia, unspecified 10/28/2012   Anxiety state, unspecified 10/28/2012   CAD (coronary artery disease)    a. Stent RCA in [facility];  b. Cath approx 2009 - nonobs per pt report.   Cataract    immature on the left eye   Chronic insomnia 02/07/2013   Chronic lower back pain    scoliosis   CKD (chronic kidney disease) stage 3, GFR 29-92 ml/min    Complication of anesthesia    pt has a very high tolerance to meds   CVA (cerebral infarction) 10/29/2012   DDD (degenerative disc disease)    Depression    Diverticulitis    hx of   Diverticulosis    Enteritis    GERD (gastroesophageal reflux disease) 09/01/2010   Giant cell arteritis (HCC)    Hemorrhoids    Herniated nucleus pulposus, L5-S1, right 11/04/2015   History of scabies    HTN (hypertension) 05/20/2017   Hyperlipidemia    takes Lipitor daily   Hypertension    takes Amlodipine,Losartan,Metoprolol,and HCTZ daily   Incontinence of urine    Insomnia    takes Restoril nightly   Lumbar stenosis 04/25/2013   Major depressive disorder, recurrent episode, moderate ([place]) 07/16/2013   Migraines    "back in my 20's; none since" (10/28/2012)   Osteoporosis    PAF (paroxysmal atrial fibrillation) ([place]) 2011   a. lone epidode in 2011 according to notes.   Rheumatoid arthritis ([place])    Scoliosis    Sepsis ([place]) 05/2019   Sinus bradycardia    a. on chronic bb   Temporal arteritis ([place]) 2011   a. followed @ [facility]; potential flareup 10/28/2012/notes  10/28/2012   Vocal cord dysfunction    "they don't operate properly" (10/28/2012)    Past Surgical History:  Procedure Laterality Date   ABDOMINAL HYSTERECTOMY  ~ 1984   vaginally   BACK SURGERY  7-31yrs ago   X Stop   BLADDER SUSPENSION  2001   BREAST BIOPSY Right    CATARACT EXTRACTION W/ INTRAOCULAR LENS IMPLANT Right ~ 08/2012   CHEST TUBE INSERTION Left 03/08/2019   Procedure: Chest Tube Insertion;  Surgeon: Ivin Poot, MD;  Location: [place];  Service: Thoracic;  Laterality: Left;   COLONOSCOPY  01/26/2012   Procedure: COLONOSCOPY;  Surgeon: Ladene Artist, MD,FACG;  Location: [facility] ENDOSCOPY;  Service: Endoscopy;  Laterality: N/A;  note the EGD is possible   CORONARY ANGIOPLASTY WITH STENT PLACEMENT  2006   X 1 stent   EPIDURAL BLOCK INJECTION     ESOPHAGOGASTRODUODENOSCOPY  01/26/2012   Procedure: ESOPHAGOGASTRODUODENOSCOPY (EGD);  Surgeon: Ladene Artist, MD,FACG;  Location[facility] END[facility]COPY;  Service: Endoscopy;  Laterality: N/A;   FINE NEEDLE ASPIRATION Right 09/26/2019   Procedure: FINE NEEDLE ASPIRATION;  Surgeon: Altamese Larson, MD;  Location: [place];  Service: Orthopedics;  Laterality: Right;   HEMIARTHROPLASTY HIP Right 2012   IR EPIDUROGRAPHY  04/20/2019   LAPAROSCOPIC CHOLECYSTECTOMY  2001   LUMBAR FUSION  03/2013  LUMBAR LAMINECTOMY/DECOMPRESSION MICRODISCECTOMY Right 11/04/2015   Procedure: Right Lumbar Five-Sacral One Microdiskectomy;  Surgeon: Kristeen Miss, MD;  Location: [place] NEURO ORS;  Service: Neurosurgery;  Laterality: Right;  Right L5-S1 Microdiskectomy   moles removed that required stiches     one on leg and one on face   ORIF FEMUR FRACTURE Right 09/26/2019   Procedure: OPEN REDUCTION INTERNAL FIXATION (ORIF) DISTAL FEMUR FRACTURE;  Surgeon: Altamese Hanalei, MD;  Location: [place];  Service: Orthopedics;  Laterality: Right;   SUBXYPHOID PERICARDIAL WINDOW N/A 03/08/2019   Procedure: SUBXYPHOID PERICARDIAL WINDOW;  Surgeon: Ivin Poot, MD;   Location: [place];  Service: Thoracic;  Laterality: N/A;   TEE WITHOUT CARDIOVERSION N/A 03/08/2019   Procedure: TRANSESOPHAGEAL ECHOCARDIOGRAM (TEE);  Surgeon: Prescott Gum, Collier Salina, MD;  Location: [facility] OR;  Service: Thoracic;  Laterality: N/A;   TEMPORAL ARTERY BIOPSY / LIGATION Bilateral 2011   TONSILLECTOMY AND ADENOIDECTOMY     at age 52   TUBAL LIGATION  ~ [place]  ~ 2010   "lower back" (10/28/2012)    Family History  Problem Relation Age of Onset   Brain cancer Mother 87   Heart attack Father    Breast cancer Daughter 64   Heart disease Other    Hypertension Other    Arthritis Neg Hx    Colon cancer Neg Hx    Osteoporosis Neg Hx     Social History:  reports that she has never smoked. She has never used smokeless tobacco. She reports current alcohol use. She reports that she does not use drugs.  Allergies:  Allergies  Allergen Reactions   Dextromethorphan Rash    Medications: I have reviewed the patient's current medications.  Results for orders placed or performed during the hospital encounter of 10/17/19 (from the past 48 hour(s))  CBC with Differential/Platelet     Status: Abnormal   Collection Time: 10/17/19  1:52 AM  Result Value Ref Range   WBC 11.3 (H) 4.0 - 10.5 K/uL   RBC 3.77 (L) 3.87 - 5.11 MIL/uL   Hemoglobin 11.0 (L) 12.0 - 15.0 g/dL   HCT 35.6 (L) 36 - 46 %   MCV 94.4 80.0 - 100.0 fL   MCH 29.2 26.0 - 34.0 pg   MCHC 30.9 30.0 - 36.0 g/dL   RDW 15.5 11.5 - 15.5 %   Platelets 239 150 - 400 K/uL   nRBC 0.0 0.0 - 0.2 %   Neutrophils Relative % 71 %   Neutro Abs 8.0 (H) 1.7 - 7.7 K/uL   Lymphocytes Relative 19 %   Lymphs Abs 2.2 0.7 - 4.0 K/uL   Monocytes Relative 9 %   Monocytes Absolute 1.0 0 - 1 K/uL   Eosinophils Relative 0 %   Eosinophils Absolute 0.0 0 - 0 K/uL   Basophils Relative 0 %   Basophils Absolute 0.0 0 - 0 K/uL   Immature Granulocytes 1 %   Abs Immature Granulocytes 0.09 (H) 0.00 - 0.07 K/uL    Comment:  Performed at [facility], [address] [place], [place] [address]  I-stat chem 8, ED (not at [facility] or [facility])     Status: Abnormal   Collection Time: 10/17/19  2:12 AM  Result Value Ref Range   Sodium 139 135 - 145 mmol/L   Potassium 4.3 3.5 - 5.1 mmol/L   Chloride 99 98 - 111 mmol/L   BUN 39 (H) 8 - 23 mg/dL   Creatinine, Ser 1.30 (H)  0.44 - 1.00 mg/dL   Glucose, Bld 96 70 - 99 mg/dL    Comment: Glucose reference range applies only to samples taken after fasting for at least 8 hours.   Calcium, Ion 1.10 (L) 1.15 - 1.40 mmol/L   TCO2 29 22 - 32 mmol/L   Hemoglobin 11.9 (L) 12.0 - 15.0 g/dL   HCT 35.0 (L) 36 - 46 %  SARS Coronavirus 2 by RT PCR (hospital order, performed in [facility] hospital lab) Nasopharyngeal Nasopharyngeal Swab     Status: None   Collection Time: 10/17/19  2:17 AM   Specimen: Nasopharyngeal Swab  Result Value Ref Range   SARS Coronavirus 2 NEGATIVE NEGATIVE    Comment: (NOTE) SARS-CoV-2 target nucleic acids are NOT DETECTED.  The SARS-CoV-2 RNA is generally detectable in upper and lower respiratory specimens during the acute phase of infection. The lowest concentration of SARS-CoV-2 viral copies this assay can detect is 250 copies / mL. A negative result does not preclude SARS-CoV-2 infection and should not be used as the sole basis for treatment or other patient management decisions.  A negative result may occur with improper specimen collection / handling, submission of specimen other than nasopharyngeal swab, presence of viral mutation(s) within the areas targeted by this assay, and inadequate number of viral copies (<250 copies / mL). A negative result must be combined with clinical observations, patient history, and epidemiological information.  Fact Sheet for Patients:   StrictlyIdeas.no  Fact Sheet for Healthcare Providers: BankingDealers.co.za  This test is not yet approved or  cleared by the  [place] FDA and has been authorized for detection and/or diagnosis of SARS-CoV-2 by FDA under an Emergency Use Authorization (EUA).  This EUA will remain in effect (meaning this test can be used) for the duration of the COVID-19 declaration under Section 564(b)(1) of the Act, 21 U.S.C. section 360bbb-3(b)(1), unless the authorization is terminated or revoked sooner.  Performed at [facility], [place] [address] [place], [place] [address]   Type and screen [place]     Status: None   Collection Time: 10/17/19  5:40 AM  Result Value Ref Range   ABO/RH(D) O POS    Antibody Screen NEG    Sample Expiration      10/20/2019,2359 Performed at [facility], [place] [address], [place], [place] [address]     CT Head Wo Contrast  Result Date: 10/17/2019 CLINICAL DATA:  Status post fall. EXAM: CT HEAD WITHOUT CONTRAST TECHNIQUE: Contiguous axial images were obtained from the base of the skull through the vertex without intravenous contrast. COMPARISON:  June 20, 2019 FINDINGS: Brain: There is mild cerebral atrophy with widening of the extra-axial spaces and ventricular dilatation. There are areas of decreased attenuation within the white matter tracts of the supratentorial brain, consistent with microvascular disease changes. Vascular: No hyperdense vessel or unexpected calcification. Skull: Normal. Negative for fracture or focal lesion. Sinuses/Orbits: No acute finding. Other: None. IMPRESSION: 1. Generalized cerebral atrophy. 2. No acute intracranial abnormality. Electronically Signed   By: Virgina Norfolk M.D.   On: 10/17/2019 03:05   CT Cervical Spine Wo Contrast  Result Date: 10/17/2019 CLINICAL DATA:  Status post fall. EXAM: CT CERVICAL SPINE WITHOUT CONTRAST TECHNIQUE: Multidetector CT imaging of the cervical spine was performed without intravenous contrast. Multiplanar CT image reconstructions were also generated. COMPARISON:  None. FINDINGS: Alignment:  There is approximately 1 mm anterolisthesis of the C3 vertebral body on C4. 2 mm to 3 mm  anterolisthesis of the C4 vertebral body on C5 is noted. Skull base and vertebrae: No acute fracture. No primary bone lesion or focal pathologic process. Soft tissues and spinal canal: No prevertebral fluid or swelling. No visible canal hematoma. Disc levels: Moderate to marked severity endplate sclerosis is seen at the levels of C5-C6 and C6-C7. Moderate to marked severity intervertebral disc space narrowing is also seen at these levels. Marked severity posterior intervertebral disc space narrowing is seen at the level of C3-C4. Marked severity bilateral multilevel facet joint hypertrophy is noted. Upper chest: Negative. Other: None. IMPRESSION: 1. No acute fracture within the cervical spine. 2. Marked severity bilateral multilevel facet joint hypertrophy. 3. Moderate to marked severity degenerative changes at the levels of C5-C6 and C6-C7. 4. 2 mm to 3 mm anterolisthesis of the C3 vertebral body on C4 and C4 vertebral body on C5. Electronically Signed   By: Virgina Norfolk M.D.   On: 10/17/2019 03:07   Chest Portable 1 View  Result Date: 10/17/2019 CLINICAL DATA:  Preoperative evaluation. EXAM: PORTABLE CHEST 1 VIEW COMPARISON:  September 25, 2019 FINDINGS: Mild, diffuse chronic appearing increased lung markings are seen without evidence of acute infiltrate, pleural effusion or pneumothorax. A radiopaque surgical suture is seen overlying the medial aspect of the left lung base. This is present on the prior exam. The cardiac silhouette is mildly enlarged and unchanged in size. There is evidence of prior vertebroplasty within the lower thoracic spine. IMPRESSION: Chronic appearing increased lung markings without evidence of acute or active cardiopulmonary disease. Electronically Signed   By: Virgina Norfolk M.D.   On: 10/17/2019 03:20   DG Knee Complete 4 Views Right  Result Date: 10/17/2019 CLINICAL DATA:  Pain EXAM:  RIGHT KNEE - COMPLETE 4+ VIEW COMPARISON:  None. FINDINGS: There is an acute displaced and angulated fracture of the right femoral metadiaphysis. There is a large suprapatellar joint effusion. The patient is status post prior plate screw fixation of the distal femur. There are 2 transcortical screws coursing through the femoral condyles which appear grossly intact. There are advanced degenerative changes of the knee. IMPRESSION: 1. Acute displaced and angulated fracture of the right femoral metadiaphysis. 2. Large suprapatellar joint effusion. 3. Advanced degenerative changes of the right knee. Electronically Signed   By: Constance Holster M.D.   On: 10/17/2019 02:13   DG Hip Unilat W or Wo Pelvis 2-3 Views Left  Result Date: 10/17/2019 CLINICAL DATA:  Pain EXAM: DG HIP (WITH OR WITHOUT PELVIS) 2-3V LEFT COMPARISON:  Aug 11, 2019 FINDINGS: The patient is status post total hip arthroplasty on the right. There is some mild, relatively stable lucency about the acetabular cup. There are healing fractures of the left inferior pubic ramus. There are degenerative changes of the left hip. There is osteopenia which limits detection of nondisplaced fractures. IMPRESSION: 1. No acute displaced fracture or dislocation. 2. Healing fracture of the left inferior pubic ramus. 3. Status post total hip arthroplasty on the right. 4. Osteopenia which limits detection of nondisplaced fractures. Electronically Signed   By: Constance Holster M.D.   On: 10/17/2019 02:11   DG Hip Unilat W or Wo Pelvis 2-3 Views Right  Result Date: 10/17/2019 CLINICAL DATA:  Hip pain EXAM: DG HIP (WITH OR WITHOUT PELVIS) 2-3V RIGHT COMPARISON:  May 2021 FINDINGS: The patient is status post total hip arthroplasty on the right. The hardware appears intact. There is some lucency about the acetabular cup which may indicate underlying loosening. There is no acute displaced  fracture. IMPRESSION: No acute abnormality. Electronically Signed   By:  Constance Holster M.D.   On: 10/17/2019 02:12   DG Femur Min 2 Views Right  Result Date: 10/17/2019 CLINICAL DATA:  Pain EXAM: RIGHT FEMUR 2 VIEWS COMPARISON:  None. FINDINGS: The patient has undergone prior plate and screw fixation of the distal femur at the level of the metadiaphysis. At this level, there is a new acute displaced fracture with significant associated angulation. There are 2 transcortical screws through the femoral condyles that appear intact. There are degenerative changes of the knee. There is a large joint effusion. Vascular calcifications are noted. There is soft tissue swelling. IMPRESSION: 1. Acute displaced fracture of the distal femur at the level of the patient's prior plate and screw fixation. 2. Large joint effusion. 3. Advanced degenerative changes of the right knee. Electronically Signed   By: Constance Holster M.D.   On: 10/17/2019 02:08    Review of Systems  HENT: Negative for ear discharge, ear pain, hearing loss and tinnitus.   Eyes: Negative for photophobia and pain.  Respiratory: Negative for cough and shortness of breath.   Cardiovascular: Negative for chest pain.  Gastrointestinal: Negative for abdominal pain, nausea and vomiting.  Genitourinary: Negative for dysuria, flank pain, frequency and urgency.  Musculoskeletal: Positive for arthralgias (Right knee). Negative for back pain, myalgias and neck pain.  Neurological: Negative for dizziness and headaches.  Hematological: Does not bruise/bleed easily.  Psychiatric/Behavioral: The patient is not nervous/anxious.    Blood pressure (!) 169/117, pulse 86, temperature 97.9 F (36.6 C), temperature source Temporal, resp. rate 19, height 5\' 4"  (1.626 m), weight 68 kg, SpO2 95 %. Physical Exam Constitutional:      General: She is not in acute distress.    Appearance: She is well-developed. She is not diaphoretic.  HENT:     Head: Normocephalic and atraumatic.  Eyes:     General: No scleral icterus.        Right eye: No discharge.        Left eye: No discharge.     Conjunctiva/sclera: Conjunctivae normal.  Cardiovascular:     Rate and Rhythm: Normal rate and regular rhythm.  Pulmonary:     Effort: Pulmonary effort is normal. No respiratory distress.  Musculoskeletal:     Cervical back: Normal range of motion.     Comments: LLE No traumatic wounds, ecchymosis, or rash  Severe TTP  No ankle effusion, mod knee effusion  Sens SPN, TN intact, DPN absent  Motor EHL, ext, flex, evers 5/5  DP 2+, PT 2+, No significant edema  Skin:    General: Skin is warm and dry.  Neurological:     Mental Status: She is alert.  Psychiatric:        Behavior: Behavior normal.     Assessment/Plan: Right distal femur fx -- Plan ORIF later today by Dr. Marcelino Scot. Please keep NPO. Multiple medical problems including multiple recent hospital stays in recent months for C. Difficile, diverticulitis, and GI bleed, hand cellulitis,history of anemia, anxiety, CAD status post stent placement, chronic low back pain, CKD, previous CVA, diverticulosis, GERD, hemorrhoids, hypertension, hyperlipidemia, PAF with anticoagulation recently stopped due to falls,GCA, and RA -- per primary service    Lisette Abu, PA-C Orthopedic Surgery [phone] 10/17/2019, 9:01 AM

## 2019-10-17 NOTE — ED Provider Notes (Signed)
[facility] EMERGENCY DEPARTMENT Provider Note   CSN: [id number] Arrival date & time: 10/17/19  0059     History Chief Complaint  Patient presents with  . Fall    Betty Jackson is a 85 y.o. female.  The history is provided by the patient and the EMS personnel.  Fall This is a new problem. The current episode started less than 1 hour ago. The problem occurs constantly. The problem has not changed since onset.Pertinent negatives include no chest pain, no abdominal pain, no headaches and no shortness of breath. Nothing aggravates the symptoms. Nothing relieves the symptoms. She has tried nothing for the symptoms. The treatment provided no relief.  Patient s/p ORIF of the distal femur on 09/25/19 presents from [place] home following a fall attempting to turn off a light.  Patient is no longer on anticoagulation.       Past Medical History:  Diagnosis Date  . Acute respiratory failure with hypoxia ([place]) 05/20/2017  . Anemia, unspecified 10/28/2012  . Anxiety state, unspecified 10/28/2012  . CAD (coronary artery disease)    a. Stent RCA in [facility];  b. Cath approx 2009 - nonobs per pt report.  . Cataract    immature on the left eye  . Chronic insomnia 02/07/2013  . Chronic lower back pain    scoliosis  . CKD (chronic kidney disease) stage 3, GFR 30-59 ml/min   . Complication of anesthesia    pt has a very high tolerance to meds  . CVA (cerebral infarction) 10/29/2012  . DDD (degenerative disc disease)   . Depression   . Diverticulitis    hx of  . Diverticulosis   . Enteritis   . GERD (gastroesophageal reflux disease) 09/01/2010  . Giant cell arteritis ([place])   . Hemorrhoids   . Herniated nucleus pulposus, L5-S1, right 11/04/2015  . History of scabies   . HTN (hypertension) 05/20/2017  . Hyperlipidemia    takes Lipitor daily  . Hypertension    takes Amlodipine,Losartan,Metoprolol,and HCTZ daily  . Incontinence of urine   . Insomnia    takes  Restoril nightly  . Lumbar stenosis 04/25/2013  . Major depressive disorder, recurrent episode, moderate ([place]) 07/16/2013  . Migraines    "back in my 20's; none since" (10/28/2012)  . Osteoporosis   . PAF (paroxysmal atrial fibrillation) ([place]) 2011   a. lone epidode in 2011 according to notes.  . Rheumatoid arthritis ([place])   . Scoliosis   . Sepsis ([place]) 05/2019  . Sinus bradycardia    a. on chronic bb  . Temporal arteritis ([place]) 2011   a. followed @ [facility]; potential flareup 10/28/2012/notes 10/28/2012  . Vocal cord dysfunction    "they don't operate properly" (10/28/2012)    Patient Active Problem List   Diagnosis Date Noted  . Femur fracture, right ([place]) 09/25/2019  . Chronic diastolic CHF (congestive heart failure) ([place]) 09/25/2019  . HCAP (healthcare-associated pneumonia) 06/19/2019  . Encephalopathy 06/19/2019  . Lumbar degenerative disc disease   . Paroxysmal A-fib ([place])   . Hypomagnesemia   . C. difficile colitis   . Diarrhea 05/29/2019  . CHF (congestive heart failure) ([place]) 05/29/2019  . Recurrent falls 04/30/2019  . Pericardial effusion without cardiac tamponade 03/29/2019  . Hypoalbuminemia 03/27/2019  . Cardiac tamponade   . Pericarditis 03/06/2019  . Fever   . Septic shock ([place])   . Atypical chest pain   . Atrial fibrillation with rapid ventricular response ([place])   . Nausea  01/17/2019  . Acute diverticulitis 01/13/2019  . AKI (acute kidney injury) ([place]) 01/13/2019  . Chronic back pain 01/13/2019  . Tachycardia 01/13/2019  . Hypokalemia 01/13/2019  . Chronic low back pain 07/10/2018  . Influenza A 05/20/2017  . Sepsis ([place]) 05/20/2017  . Acute respiratory failure with hypoxia ([place]) 05/20/2017  . CKD (chronic kidney disease), stage III 05/20/2017  . PAF (paroxysmal atrial fibrillation) ([place]) 05/20/2017  . HTN (hypertension) 05/20/2017  . Giant cell arteritis ([place]) 05/20/2017  . Coronary disease 05/20/2017  . Abnormal urinalysis 05/20/2017  . Osteoporosis  05/08/2016  . Herniated nucleus pulposus, L5-S1, right 11/04/2015  . Major depressive disorder, recurrent episode, moderate ([place]) 07/16/2013  . Abdominal pain, epigastric 06/22/2013  . Lumbar stenosis 04/25/2013  . Chronic insomnia 02/07/2013  . Elevated transaminase level 02/07/2013  . CVA (cerebral infarction) 10/29/2012  . Visual changes 10/28/2012  . Depression 10/28/2012  . Anxiety state 10/28/2012  . Anemia, unspecified 10/28/2012  . Nonspecific (abnormal) findings on radiological and other examination of gastrointestinal tract 01/25/2012  . Hyponatremia 01/24/2012  . Hematuria 01/24/2012  . Enteritis 12/24/2011  . Abdominal pain, other specified site 12/24/2011  . Leg pain, right 02/18/2011  . Temporal arteritis ([place]) 09/01/2010  . Hypertension 09/01/2010  . Hyperlipidemia 09/01/2010  . History of atrial fibrillation 09/01/2010  . CAD (coronary artery disease) 09/01/2010  . GERD (gastroesophageal reflux disease) 09/01/2010  . Insomnia 09/01/2010    Past Surgical History:  Procedure Laterality Date  . ABDOMINAL HYSTERECTOMY  ~ 1984   vaginally  . BACK SURGERY  7-55yrs ago   X Stop  . BLADDER SUSPENSION  2001  . BREAST BIOPSY Right   . CATARACT EXTRACTION W/ INTRAOCULAR LENS IMPLANT Right ~ 08/2012  . CHEST TUBE INSERTION Left 03/08/2019   Procedure: Chest Tube Insertion;  Surgeon: Ivin Poot, MD;  Location: [facility];  Service: Thoracic;  Laterality: Left;  . COLONOSCOPY  01/26/2012   Procedure: COLONOSCOPY;  Surgeon: Ladene Artist, MD,FACG;  Location: [facility] ENDOSCOPY;  Service: Endoscopy;  Laterality: N/A;  note the EGD is possible  . CORONARY ANGIOPLASTY WITH STENT PLACEMENT  2006   X 1 stent  . EPIDURAL BLOCK INJECTION    . ESOPHAGOGASTRODUODENOSCOPY  01/26/2012   Procedure: ESOPHAGOGASTRODUODENOSCOPY (EGD);  Surgeon: Ladene Artist, MD,FACG;  Location: [facility] ENDOSCOPY;  Service: Endoscopy;  Laterality: N/A;  . FINE NEEDLE ASPIRATION Right 09/26/2019   Procedure:  FINE NEEDLE ASPIRATION;  Surgeon: Altamese [place], MD;  Location: [facility];  Service: Orthopedics;  Laterality: Right;  . HEMIARTHROPLASTY HIP Right 2012  . IR EPIDUROGRAPHY  04/20/2019  . LAPAROSCOPIC CHOLECYSTECTOMY  2001  . LUMBAR FUSION  03/2013  . LUMBAR LAMINECTOMY/DECOMPRESSION MICRODISCECTOMY Right 11/04/2015   Procedure: Right Lumbar Five-Sacral One Microdiskectomy;  Surgeon: Kristeen Miss, MD;  Location: [facility] NEURO ORS;  Service: Neurosurgery;  Laterality: Right;  Right L5-S1 Microdiskectomy  . moles removed that required stiches     one on leg and one on face  . ORIF FEMUR FRACTURE Right 09/26/2019   Procedure: OPEN REDUCTION INTERNAL FIXATION (ORIF) DISTAL FEMUR FRACTURE;  Surgeon: Altamese Clayton, MD;  Location: [facility];  Service: Orthopedics;  Laterality: Right;  . SUBXYPHOID PERICARDIAL WINDOW N/A 03/08/2019   Procedure: SUBXYPHOID PERICARDIAL WINDOW;  Surgeon: Ivin Poot, MD;  Location: [facility];  Service: Thoracic;  Laterality: N/A;  . TEE WITHOUT CARDIOVERSION N/A 03/08/2019   Procedure: TRANSESOPHAGEAL ECHOCARDIOGRAM (TEE);  Surgeon: Prescott Gum, Collier Salina, MD;  Location: [facility];  Service: Thoracic;  Laterality: N/A;  .  TEMPORAL ARTERY BIOPSY / LIGATION Bilateral 2011  . TONSILLECTOMY AND ADENOIDECTOMY     at age 78  . TUBAL LIGATION  ~ 1982  . X-STOP IMPLANTATION  ~ 2010   "lower back" (10/28/2012)     OB History   No obstetric history on file.     Family History  Problem Relation Age of Onset  . Brain cancer Mother 53  . Heart attack Father   . Breast cancer Daughter 42  . Heart disease Other   . Hypertension Other   . Arthritis Neg Hx   . Colon cancer Neg Hx   . Osteoporosis Neg Hx     Social History   Tobacco Use  . Smoking status: Never Smoker  . Smokeless tobacco: Never Used  Vaping Use  . Vaping Use: Never used  Substance Use Topics  . Alcohol use: Yes    Comment: socially  . Drug use: No    Home Medications Prior to Admission medications   Medication Sig  Start Date End Date Taking? Authorizing Provider  ALPRAZolam Duanne Moron) 0.5 MG tablet Take 1 tablet (0.5 mg total) by mouth at bedtime as needed for sleep. 09/29/19   Ghimire, Henreitta Leber, MD  cholecalciferol (VITAMIN D) 25 MCG tablet Take 2 tablets (2,000 Units total) by mouth 2 (two) times daily. 09/29/19   Ainsley Spinner, PA-C  diltiazem (CARDIZEM CD) 240 MG 24 hr capsule Take 1 capsule (240 mg total) by mouth daily. 07/19/19   Bhagat, Bhavinkumar, PA  enoxaparin (LOVENOX) 30 MG/0.3ML injection Inject 0.3 mLs (30 mg total) into the skin daily for 21 days. 09/29/19 10/20/19  Ainsley Spinner, PA-C  escitalopram (LEXAPRO) 10 MG tablet TAKE 1 TABLET BY MOUTH EVERY DAY Patient taking differently: Take 10 mg by mouth daily.  12/14/18   Burchette, Alinda Sierras, MD  famotidine (PEPCID) 20 MG tablet Take 1 tablet (20 mg total) by mouth daily. Patient taking differently: Take 20 mg by mouth daily as needed.  06/02/19   Eugenie Filler, MD  fentaNYL (DURAGESIC) 12 MCG/HR Place 1 patch onto the skin every 3 (three) days. 09/29/19   Ghimire, Henreitta Leber, MD  ferrous sulfate 325 (65 FE) MG tablet Take 1 tablet (325 mg total) by mouth 2 (two) times daily with a meal. 04/21/19   Oswald Hillock, MD  furosemide (LASIX) 40 MG tablet Take 1 tablet (40 mg total) by mouth daily. 06/03/19   Eugenie Filler, MD  HYDROcodone-acetaminophen [facility]) 10-325 MG tablet Take one tablet by mouth every 8 hours as needed for breakthrough pain. 09/29/19   Ghimire, Henreitta Leber, MD  lactobacillus acidophilus (BACID) TABS tablet Take 2 tablets by mouth 3 (three) times daily. Patient taking differently: Take 1 tablet by mouth daily.  06/01/19   Eugenie Filler, MD  metoprolol tartrate (LOPRESSOR) 100 MG tablet Take 1 tablet (100 mg total) by mouth 2 (two) times daily. 07/24/19   Burnell Blanks, MD  potassium chloride SA (KLOR-CON) 10 MEQ tablet Take 1 tablet (10 mEq total) by mouth daily. 04/21/19   Oswald Hillock, MD  predniSONE (DELTASONE) 10 MG tablet 20 mg  daily x 4 days, then back to 10 mg daily 09/29/19   Ghimire, Henreitta Leber, MD  vitamin C (ASCORBIC ACID) 500 MG tablet Take 1,000 mg by mouth daily.     [provider]  vitamin E (VITAMIN E) 400 UNIT capsule Take 400 Units by mouth daily.    [provider]    Allergies  Dextromethorphan  Review of Systems   Review of Systems  Constitutional: Negative for fever.  HENT: Negative for congestion.   Eyes: Negative for visual disturbance.  Respiratory: Negative for shortness of breath.   Cardiovascular: Negative for chest pain.  Gastrointestinal: Negative for abdominal pain.  Genitourinary: Negative for difficulty urinating.  Musculoskeletal: Positive for arthralgias.  Skin: Negative for rash.  Neurological: Negative for headaches.  Psychiatric/Behavioral: Negative for agitation.  All other systems reviewed and are negative.   Physical Exam Updated Vital Signs BP (!) 192/73 (BP Location: Right Arm)   Pulse 62   Temp 97.9 F (36.6 C) (Temporal)   Resp 18   Ht 5\' 4"  (1.626 m)   Wt 68 kg   SpO2 96%   BMI 25.75 kg/m   Physical Exam Vitals and nursing note reviewed.  Constitutional:      General: She is not in acute distress.    Appearance: She is not diaphoretic.  HENT:     Head: Normocephalic and atraumatic.     Right Ear: Tympanic membrane normal.     Left Ear: Tympanic membrane normal.     Nose: Nose normal.     Mouth/Throat:     Mouth: Mucous membranes are moist.     Pharynx: Oropharynx is clear.  Eyes:     Conjunctiva/sclera: Conjunctivae normal.     Pupils: Pupils are equal, round, and reactive to light.  Cardiovascular:     Rate and Rhythm: Normal rate and regular rhythm.     Pulses: Normal pulses.     Heart sounds: Normal heart sounds.  Pulmonary:     Effort: Pulmonary effort is normal.     Breath sounds: Normal breath sounds.  Abdominal:     General: Abdomen is flat. Bowel sounds are normal.     Tenderness: There is no abdominal  tenderness. There is no guarding or rebound.  Musculoskeletal:        General: Normal range of motion.     Cervical back: Normal range of motion and neck supple.  Skin:    General: Skin is warm and dry.     Capillary Refill: Capillary refill takes less than 2 seconds.  Neurological:     General: No focal deficit present.     Mental Status: She is alert and oriented to person, place, and time.     Deep Tendon Reflexes: Reflexes normal.  Psychiatric:        Mood and Affect: Mood normal.        Behavior: Behavior normal.     ED Results / Procedures / Treatments   Labs (all labs ordered are listed, but only abnormal results are displayed) Results for orders placed or performed during the hospital encounter of 10/17/19  CBC with Differential/Platelet  Result Value Ref Range   WBC 11.3 (H) 4.0 - 10.5 K/uL   RBC 3.77 (L) 3.87 - 5.11 MIL/uL   Hemoglobin 11.0 (L) 12.0 - 15.0 g/dL   HCT 35.6 (L) 36 - 46 %   MCV 94.4 80.0 - 100.0 fL   MCH 29.2 26.0 - 34.0 pg   MCHC 30.9 30.0 - 36.0 g/dL   RDW 15.5 11.5 - 15.5 %   Platelets 239 150 - 400 K/uL   nRBC 0.0 0.0 - 0.2 %   Neutrophils Relative % 71 %   Neutro Abs 8.0 (H) 1.7 - 7.7 K/uL   Lymphocytes Relative 19 %   Lymphs Abs 2.2 0.7 - 4.0 K/uL   Monocytes Relative 9 %  Monocytes Absolute 1.0 0 - 1 K/uL   Eosinophils Relative 0 %   Eosinophils Absolute 0.0 0 - 0 K/uL   Basophils Relative 0 %   Basophils Absolute 0.0 0 - 0 K/uL   Immature Granulocytes 1 %   Abs Immature Granulocytes 0.09 (H) 0.00 - 0.07 K/uL  I-stat chem 8, ED (not at [facility] or [facility])  Result Value Ref Range   Sodium 139 135 - 145 mmol/L   Potassium 4.3 3.5 - 5.1 mmol/L   Chloride 99 98 - 111 mmol/L   BUN 39 (H) 8 - 23 mg/dL   Creatinine, Ser 1.30 (H) 0.44 - 1.00 mg/dL   Glucose, Bld 96 70 - 99 mg/dL   Calcium, Ion 1.10 (L) 1.15 - 1.40 mmol/L   TCO2 29 22 - 32 mmol/L   Hemoglobin 11.9 (L) 12.0 - 15.0 g/dL   HCT 35.0 (L) 36 - 46 %   CT Knee Right Wo  Contrast  Result Date: 09/25/2019 CLINICAL DATA:  Right knee pain and swelling since a fall yesterday. Distal femur fracture. EXAM: CT OF THE RIGHT KNEE WITHOUT CONTRAST TECHNIQUE: Multidetector CT imaging of the right knee was performed according to the standard protocol. Multiplanar CT image reconstructions were also generated. COMPARISON:  Radiographs dated 09/25/2019 FINDINGS: Bones/Joint/Cartilage There is an oblique fracture through the medial aspect of the distal right femoral metaphysis extending into the intercondylar notch. On the axial and coronal images there appears to be a sagittal fracture involving the periphery of the posterior aspect of femoral condyle but this is not definitive. No other fractures. Hemarthrosis. Tricompartmental osteoarthritis. Ganglion cyst extending along the popliteus tendon containing a small calcified loose body. Ligaments Posterior cruciate ligament and collateral ligaments are intact. Anterior cruciate ligament is not well enough seen for assessment. Muscles and Tendons Negative. Soft tissues Subcutaneous edema or hemorrhage around the knee to the expected degree. No definable hematoma. IMPRESSION: 1. Oblique fracture through the medial aspect of the distal right femoral metaphysis extending into the intercondylar notch. 2. Possible sagittal fracture involving the periphery of the posterior aspect of the femoral condyle but this is not definitive. 3. Hemarthrosis. 4. Tricompartmental osteoarthritis. 5. Ganglion cyst extending along the popliteus tendon containing a small calcified loose body. Electronically Signed   By: [name] M.D.   On: 09/25/2019 14:39   CT ABDOMEN PELVIS W CONTRAST  Result Date: 09/25/2019 CLINICAL DATA:  Fall EXAM: CT ABDOMEN AND PELVIS WITH CONTRAST TECHNIQUE: Multidetector CT imaging of the abdomen and pelvis was performed using the standard protocol following bolus administration of intravenous contrast. CONTRAST:  175mL OMNIPAQUE  IOHEXOL 300 MG/ML  SOLN COMPARISON:  CT 09/04/2019, 05/28/2019, 04/15/2019, 03/06/2019 FINDINGS: Lower chest: Lung bases demonstrate no acute consolidation or effusion. Mild cardiomegaly. Coronary vascular calcification. Hepatobiliary: Status post cholecystectomy. Mild intra and extrahepatic biliary dilatation as before. No focal hepatic abnormality Pancreas: Choose Spleen: Normal in size without focal abnormality. Adrenals/Urinary Tract: Adrenal glands are normal. Subcentimeter hypodensities in the left kidney too small to further characterize. No hydronephrosis. Bladder is slightly thick walled anteriorly. Stomach/Bowel: Stomach nonenlarged. No dilated small bowel. Negative appendix. Colon diverticular disease without acute inflammatory change. Vascular/Lymphatic: Moderate aortic atherosclerosis without aneurysm. No suspicious adenopathy. Reproductive: Status post hysterectomy. No adnexal masses. Other: Negative for free air or free fluid. Small fat containing umbilical hernia. Musculoskeletal: Status post right hip replacement with artifact. Sclerosis and periosteal reaction involving the left inferior pubic ramus and ischial tuberosity corresponding to history of chronic fractures. Postsurgical changes of  the lumbar spine L2 through L4. Post augmentation changes at T11 and L1. No definite acute osseous abnormality. IMPRESSION: 1. No CT evidence for acute intra-abdominal or pelvic abnormality. 2. Colon diverticular disease without acute inflammatory change. Aortic Atherosclerosis (ICD10-I70.0). Electronically Signed   By: Donavan Foil M.D.   On: 09/25/2019 17:36   DG Chest Port 1 View  Result Date: 09/25/2019 CLINICAL DATA:  Fall with femur fracture. EXAM: PORTABLE CHEST 1 VIEW COMPARISON:  06/19/2019 FINDINGS: Chronic cardiomegaly with left ventricular prominence. Chronic aortic atherosclerosis. Chronic pulmonary scarring left more than right. No sign of active infiltrate, mass, effusion or collapse. No  traumatic finding of an acute nature in the region. Old augmented lower thoracic fracture. IMPRESSION: No active cardiopulmonary disease. Chronic pulmonary scarring left worse than right. Electronically Signed   By: Nelson Chimes M.D.   On: 09/25/2019 15:18   DG Knee Complete 4 Views Right  Result Date: 10/17/2019 CLINICAL DATA:  Pain EXAM: RIGHT KNEE - COMPLETE 4+ VIEW COMPARISON:  None. FINDINGS: There is an acute displaced and angulated fracture of the right femoral metadiaphysis. There is a large suprapatellar joint effusion. The patient is status post prior plate screw fixation of the distal femur. There are 2 transcortical screws coursing through the femoral condyles which appear grossly intact. There are advanced degenerative changes of the knee. IMPRESSION: 1. Acute displaced and angulated fracture of the right femoral metadiaphysis. 2. Large suprapatellar joint effusion. 3. Advanced degenerative changes of the right knee. Electronically Signed   By: Constance Holster M.D.   On: 10/17/2019 02:13   DG Knee Complete 4 Views Right  Result Date: 09/26/2019 CLINICAL DATA:  85 year old female with distal right femur fracture, ORIF. EXAM: DG C-ARM 1-60 MIN; RIGHT KNEE - COMPLETE 4+ VIEW FLUOROSCOPY TIME:  Fluoroscopy Time:  0 minutes 50 seconds Radiation Exposure Index (if provided by the fluoroscopic device): 2.2 mGy Number of Acquired Spot Images: 0 COMPARISON:  Right knee CT 09/25/2019. FINDINGS: Seven intraoperative fluoroscopic spot views of the right knee demonstrate placement of medial malleable plate and screws and distal condyle level cannulated screws. Hardware appears intact. Alignment remains near anatomic. IMPRESSION: Distal right femur ORIF with no adverse features. Electronically Signed   By: Genevie Ann M.D.   On: 09/26/2019 15:42   DG Knee Complete 4 Views Right  Result Date: 09/25/2019 CLINICAL DATA:  Fall, pain EXAM: RIGHT KNEE - COMPLETE 4+ VIEW COMPARISON:  None. FINDINGS: Alignment is  anatomic. There is an acute fracture of the distal metaphysis of the femur involving the medial cortex. Minor displacement. There are changes of osteoarthritis with joint space narrowing and marginal osteophytes, greatest medially. Probable joint effusion. Vascular calcifications noted. IMPRESSION: Acute fracture of the medial cortex of the distal metaphysis of the right femur with minimal displacement. Probable joint effusion. Osteoarthritis. Electronically Signed   By: Macy Mis M.D.   On: 09/25/2019 12:27   DG Knee Right Port  Result Date: 09/27/2019 CLINICAL DATA:  Status post ORIF of distal right femoral fracture. EXAM: PORTABLE RIGHT KNEE - 1-2 VIEW COMPARISON:  Intraoperative films from earlier in the same day. FINDINGS: Fixation sideplate is noted along the lateral aspect of the distal femur. Two large fixation screws are noted traversing the distal aspect of the femur. Fracture fragments are in near anatomic alignment. Small joint effusion is noted. IMPRESSION: ORIF of distal right femoral fracture. Electronically Signed   By: Inez Catalina M.D.   On: 09/27/2019 14:13   DG Foot Complete Right  Result Date: 09/26/2019 CLINICAL DATA:  85 year old female with fall and right knee fracture. Right foot pain. EXAM: RIGHT FOOT COMPLETE - 3+ VIEW COMPARISON:  None. FINDINGS: Portable views including cross-table lateral. Calcaneus and tarsal bones appear intact. Metatarsals appear intact with mild to moderate 1st MTP degeneration and mild hallux valgus. No phalanx fracture identified. Calcified peripheral vascular disease but no discrete soft tissue injury. IMPRESSION: No acute fracture or dislocation identified about the right foot. Electronically Signed   By: Genevie Ann M.D.   On: 09/26/2019 10:08   DG C-Arm 1-60 Min  Result Date: 09/26/2019 CLINICAL DATA:  85 year old female with distal right femur fracture, ORIF. EXAM: DG C-ARM 1-60 MIN; RIGHT KNEE - COMPLETE 4+ VIEW FLUOROSCOPY TIME:  Fluoroscopy  Time:  0 minutes 50 seconds Radiation Exposure Index (if provided by the fluoroscopic device): 2.2 mGy Number of Acquired Spot Images: 0 COMPARISON:  Right knee CT 09/25/2019. FINDINGS: Seven intraoperative fluoroscopic spot views of the right knee demonstrate placement of medial malleable plate and screws and distal condyle level cannulated screws. Hardware appears intact. Alignment remains near anatomic. IMPRESSION: Distal right femur ORIF with no adverse features. Electronically Signed   By: Genevie Ann M.D.   On: 09/26/2019 15:42   DG Hip Unilat W or Wo Pelvis 2-3 Views Left  Result Date: 10/17/2019 CLINICAL DATA:  Pain EXAM: DG HIP (WITH OR WITHOUT PELVIS) 2-3V LEFT COMPARISON:  Aug 11, 2019 FINDINGS: The patient is status post total hip arthroplasty on the right. There is some mild, relatively stable lucency about the acetabular cup. There are healing fractures of the left inferior pubic ramus. There are degenerative changes of the left hip. There is osteopenia which limits detection of nondisplaced fractures. IMPRESSION: 1. No acute displaced fracture or dislocation. 2. Healing fracture of the left inferior pubic ramus. 3. Status post total hip arthroplasty on the right. 4. Osteopenia which limits detection of nondisplaced fractures. Electronically Signed   By: Constance Holster M.D.   On: 10/17/2019 02:11   DG Hip Unilat W or Wo Pelvis 2-3 Views Right  Result Date: 10/17/2019 CLINICAL DATA:  Hip pain EXAM: DG HIP (WITH OR WITHOUT PELVIS) 2-3V RIGHT COMPARISON:  May 2021 FINDINGS: The patient is status post total hip arthroplasty on the right. The hardware appears intact. There is some lucency about the acetabular cup which may indicate underlying loosening. There is no acute displaced fracture. IMPRESSION: No acute abnormality. Electronically Signed   By: Constance Holster M.D.   On: 10/17/2019 02:12   DG Hip Unilat W or Wo Pelvis 2-3 Views Right  Result Date: 09/25/2019 CLINICAL DATA:  Pain  following recent fall EXAM: DG HIP (WITH OR WITHOUT PELVIS) 2-3V RIGHT COMPARISON:  Aug 11, 2019. FINDINGS: Frontal pelvis as well as frontal and lateral right hip images were obtained. There is a total hip replacement on the right with prosthetic components appearing well-seated. There is a healing fracture of the left ischium with callus formation in this area and bony remodeling. There is underlying osteoporosis. No acute appearing fracture evident. No dislocation. There is moderate narrowing of the left hip joint. There is degenerative change in the visualized lower lumbar spine with postoperative changes. There are multiple foci of arterial vascular calcification. IMPRESSION: Status post total hip replacement right with prosthetic components well-seated. No acute fracture evident.  Healing fracture left ischium. Moderate narrowing left hip joint.  No erosive change. Bones osteoporotic. Postoperative change as well as degenerative change lower lumbar spine. Electronically Signed  By: Lowella Grip III M.D.   On: 09/25/2019 12:28   DG Femur Min 2 Views Right  Result Date: 10/17/2019 CLINICAL DATA:  Pain EXAM: RIGHT FEMUR 2 VIEWS COMPARISON:  None. FINDINGS: The patient has undergone prior plate and screw fixation of the distal femur at the level of the metadiaphysis. At this level, there is a new acute displaced fracture with significant associated angulation. There are 2 transcortical screws through the femoral condyles that appear intact. There are degenerative changes of the knee. There is a large joint effusion. Vascular calcifications are noted. There is soft tissue swelling. IMPRESSION: 1. Acute displaced fracture of the distal femur at the level of the patient's prior plate and screw fixation. 2. Large joint effusion. 3. Advanced degenerative changes of the right knee. Electronically Signed   By: Constance Holster M.D.   On: 10/17/2019 02:08    Radiology No results  found.  Procedures Procedures (including critical care time)  Medications Ordered in ED Medications  fentaNYL (SUBLIMAZE) injection 50 mcg (has no administration in time range)    ED Course  I have reviewed the triage vital signs and the nursing notes.  Pertinent labs & imaging results that were available during my care of the patient were reviewed by me and considered in my medical decision making (see chart for details).    Case d/w Dr. Marcelino Scot who will see the patient in consult.  Add to treatment team.  NPO  Final Clinical Impression(s) / ED Diagnoses Final diagnoses:  Fall, initial encounter  Closed fracture of right femur, unspecified fracture morphology, unspecified portion of femur, initial encounter [facility])    Admit to medicine    Lizmary Nader, MD 10/17/19 0923

## 2019-10-17 NOTE — Transfer of Care (Signed)
Immediate Anesthesia Transfer of Care Note  Patient: Betty Jackson  Procedure(s) Performed: OPEN REDUCTION INTERNAL FIXATION (ORIF) DISTAL FEMUR FRACTURE (Right Knee)  Patient Location: PACU  Anesthesia Type:General  Level of Consciousness: sedated, drowsy, patient cooperative and responds to stimulation  Airway & Oxygen Therapy: Patient Spontanous Breathing and Patient connected to nasal cannula oxygen  Post-op Assessment: Report given to RN, Post -op Vital signs reviewed and stable and Patient moving all extremities X 4  Post vital signs: Reviewed and stable  Last Vitals:  Vitals Value Taken Time  BP 167/63 10/17/19 1923  Temp    Pulse 84 10/17/19 1926  Resp 23 10/17/19 1926  SpO2 96 % 10/17/19 1926  Vitals shown include unvalidated device data.  Last Pain:  Vitals:   10/17/19 1017  TempSrc:   PainSc: Asleep         Complications: No complications documented.

## 2019-10-17 NOTE — ED Notes (Signed)
Daughter at bedside. EDP updating patient and daughter on plan of care

## 2019-10-17 NOTE — Plan of Care (Signed)
  Problem: Education: Goal: Knowledge of General Education information will improve Description: Including pain rating scale, medication(s)/side effects and non-pharmacologic comfort measures 10/17/2019 2359 by Damita Dunnings, RN Outcome: Progressing 10/17/2019 2359 by Damita Dunnings, RN Outcome: Progressing   Problem: Education: Goal: Knowledge of General Education information will improve Description: Including pain rating scale, medication(s)/side effects and non-pharmacologic comfort measures 10/17/2019 2359 by Damita Dunnings, RN Outcome: Progressing 10/17/2019 2359 by Damita Dunnings, RN Outcome: Progressing   Problem: Education: Goal: Knowledge of General Education information will improve Description: Including pain rating scale, medication(s)/side effects and non-pharmacologic comfort measures 10/17/2019 2359 by Damita Dunnings, RN Outcome: Progressing 10/17/2019 2359 by Damita Dunnings, RN Outcome: Progressing

## 2019-10-17 NOTE — Progress Notes (Signed)
PROGRESS NOTE  Betty Jackson  DOB: 02/21/1935  PCP: Eulas Post, MD INO:[id number]  DOA: 10/17/2019  LOS: 0 days   Chief Complaint  Patient presents with  . Fall   Brief narrative: Betty Jackson is a 85 y.o. female with PMH significant for HTN, HLD, CAD, paroxysmal A. fib not on anticoagulation because of falls, CVA, chronic diastolic CHF, CKD, chronic anemia, chronic low back pain, rheumatoid arthritis, giant cell arteritis, diverticulosis, GERD, hemorrhoids, anxiety. She also had multiple recent hospitalizations for various problems including C. Difficile, diverticulitis, GI bleed, hand cellulitis. Patient was recently admitted earlier this month (6/28-7/2) after mechanical fall and distal R femur fx. She was supposed to be NWB 6-8 weeks on the R femur, was in SNF for rehab. On 7/20, last night, patient fell out of bed while trying to turn off her light.  She complained severe pain on the right femur/knee area and was hence brought to the ED.  Imaging in the ED noted right distal femur is fractured again at site of prior repair along with large joint effusion. Orthopedic surgeon Dr. Marcelino Scot was notified by EDP. Patient was admitted under hospitalist service for further evaluation and management  Subjective: Patient was seen and examined this morning in the ED.  Lying on bed.  Pain partially controlled. Chart reviewed.  No fever. Blood pressure running mostly elevated up to 190s Creatinine at baseline less than 1, presented with creatinine 1.3.  Assessment/Plan: Closed fx of R distal femur - for second time in 3 weeks -Orthopedics involved, tentative plan of surgical fixation today.   -Continue pain control.   -Currently on SCD boots.  Defer to orthopedic surgery postsurgical DVT prophylaxis. She seems to have tolerated subcu Lovenox for last 3 weeks after previous surgery  Perioperative medical risk assessment -She has history of coronary disease but currently no current  cardiac symptoms.  Chest x-ray did not show any acute abnormality.  EKG without any significant ST-T wave changes.   -She tolerated similar procedure 20 days ago only. -She is at acceptable risk for planned procedure.  AKI -Creatinine elevated 1.3 on admission.  Baseline less than 1. -Start on normal saline at 75 mill per hour. -Repeat creatinine tomorrow.  Cardiovascular issues: HTN, HLD, CAD, pAfib, CVA, chronic diastolic CHF -Home meds include Cardizem 240 mg daily, Lasix 40 mg daily with potassium supplement, metoprolol 50 mg twice daily.  Not on anticoagulation reportedly because of falls.  I do not see statin in the list. -Continue Cardizem and metoprolol.  Keep Lasix and potassium supplement on hold because of AKI.  GCA/RA -On chronic prednisone 10mg  currently for a slow dose taper -Continue prednisone 10 mg daily.  Anxiety/depression -Cont lexapro -Cont QHS PRN xanax -Daughter reqs she be taken off of the welbutrin which was just started at SNF  Mobility: Needs PT eval postprocedure Code Status:   Code Status: DNR  Nutritional status: Body mass index is 25.75 kg/m.     Diet Order            Diet NPO time specified Except for: Ice Chips, Sips with Meds  Diet effective now                 DVT prophylaxis: SCDs Start: 10/17/19 0304   Antimicrobials:  None Fluid: Normal saline at 75 mill per hour  Consultants: Orthopedics Family Communication:  no one bedside  Status is: Inpatient  Remains inpatient appropriate because:Ongoing active pain requiring inpatient pain management, Ongoing diagnostic testing  needed not appropriate for outpatient work up and Inpatient level of care appropriate due to severity of illness   Dispo: The patient is from: SNF              Anticipated d/c is to: SNF              Anticipated d/c date is: 3 days              Patient currently is not medically stable to d/c.       Infusions:    Scheduled Meds: . diltiazem  240 mg  Oral Daily  . escitalopram  10 mg Oral Daily  . famotidine  20 mg Oral Daily  . [START ON 10/18/2019] fentaNYL  1 patch Transdermal Q72H  . ferrous sulfate  325 mg Oral BID WC  . [START ON 10/18/2019] furosemide  40 mg Oral Daily  . metoprolol tartrate  50 mg Oral BID  . [START ON 10/18/2019] potassium chloride  10 mEq Oral Daily  . predniSONE  10 mg Oral Q breakfast  . senna-docusate  1 tablet Oral QHS    Antimicrobials: Anti-infectives (From admission, onward)   None      PRN meds: ALPRAZolam, HYDROcodone-acetaminophen, morphine injection   Objective: Vitals:   10/17/19 0300 10/17/19 0348  BP: (!) 143/91 (!) 199/69  Pulse: 67 71  Resp: 17 (!) 23  Temp:    SpO2: 97% 96%   No intake or output data in the 24 hours ending 10/17/19 0745 Filed Weights   10/17/19 0101  Weight: 68 kg   Weight change:  Body mass index is 25.75 kg/m.   Physical Exam: General exam: Appears calm.  Pain partially controlled Skin: No rashes, lesions or ulcers. HEENT: Atraumatic, normocephalic, supple neck, no obvious bleeding Lungs: Clear to auscultation bilaterally CVS: Regular rate and rhythm, no murmur GI/Abd soft, nontender, nondistended, bowel sound present CNS: Sleeping, arousable.  Oriented to place and person. Psychiatry: Depressed look, partially. Extremities: No pedal edema, no calf tenderness, right leg internally rotated  Data Review: I have personally reviewed the laboratory data and studies available.  Recent Labs  Lab 10/17/19 0152 10/17/19 0212  WBC 11.3*  --   NEUTROABS 8.0*  --   HGB 11.0* 11.9*  HCT 35.6* 35.0*  MCV 94.4  --   PLT 239  --    Recent Labs  Lab 10/17/19 0212  NA 139  K 4.3  CL 99  GLUCOSE 96  BUN 39*  CREATININE 1.30*   Signed, Terrilee Croak, MD [facility] Pager: [phone] (Secure Chat preferred). 10/17/2019

## 2019-10-17 NOTE — Anesthesia Preprocedure Evaluation (Addendum)
Anesthesia Evaluation  Patient identified by MRN, date of birth, ID band Patient awake    Reviewed: Allergy & Precautions, H&P , NPO status , Patient's Chart, lab work & pertinent test results, reviewed documented beta blocker date and time   History of Anesthesia Complications Negative for: history of anesthetic complications  Airway Mallampati: III  TM Distance: >3 FB Neck ROM: Full    Dental no notable dental hx. (+) Chipped, Dental Advisory Given   Pulmonary neg pulmonary ROS,    Pulmonary exam normal breath sounds clear to auscultation       Cardiovascular hypertension, Pt. on medications and Pt. on home beta blockers + CAD and + Cardiac Stents (2009)  + dysrhythmias Atrial Fibrillation  Rhythm:Regular Rate:Normal     Neuro/Psych Anxiety Depression negative neurological ROS     GI/Hepatic Neg liver ROS, GERD  Medicated and Controlled,  Endo/Other  negative endocrine ROS  Renal/GU Renal InsufficiencyRenal disease (Cr 1.3)  negative genitourinary   Musculoskeletal  (+) Arthritis , right distal femur fracture   Abdominal   Peds  Hematology negative hematology ROS (+) Hgb 11.9   Anesthesia Other Findings Day of surgery medications reviewed with patient.  Reproductive/Obstetrics negative OB ROS                            Anesthesia Physical Anesthesia Plan  ASA: III  Anesthesia Plan: General   Post-op Pain Management:    Induction: Intravenous  PONV Risk Score and Plan: 4 or greater and Treatment may vary due to age or medical condition, Ondansetron and Dexamethasone  Airway Management Planned: Oral ETT  Additional Equipment: None  Intra-op Plan:   Post-operative Plan: Extubation in OR  Informed Consent: I have reviewed the patients History and Physical, chart, labs and discussed the procedure including the risks, benefits and alternatives for the proposed anesthesia with  the patient or authorized representative who has indicated his/her understanding and acceptance.   Patient has DNR.  Discussed DNR with patient and Suspend DNR.   Dental advisory given  Plan Discussed with: CRNA  Anesthesia Plan Comments:        Anesthesia Quick Evaluation

## 2019-10-17 NOTE — Anesthesia Procedure Notes (Signed)
Procedure Name: Intubation Date/Time: 10/17/2019 4:42 PM Performed by: Claris Che, CRNA Pre-anesthesia Checklist: Patient identified, Emergency Drugs available, Suction available, Patient being monitored and Timeout performed Patient Re-evaluated:Patient Re-evaluated prior to induction Oxygen Delivery Method: Circle system utilized Preoxygenation: Pre-oxygenation with 100% oxygen Induction Type: IV induction and Cricoid Pressure applied Ventilation: Mask ventilation without difficulty Laryngoscope Size: Mac and 3 Grade View: Grade I Tube type: Oral Tube size: 7.0 mm Number of attempts: 1 Airway Equipment and Method: Stylet Placement Confirmation: ETT inserted through vocal cords under direct vision,  positive ETCO2 and breath sounds checked- equal and bilateral Secured at: 23 cm Tube secured with: Tape Dental Injury: Teeth and Oropharynx as per pre-operative assessment

## 2019-10-17 NOTE — H&P (Signed)
History and Physical    Betty Jackson [id number]:[id number] DOB: 11-22-1934 DOA: 10/17/2019  PCP: Eulas Post, MD  Patient coming from: SNF  I have personally briefly reviewed patient's old medical records in [place]  Chief Complaint: Fall  HPI: Betty Jackson is a 84 y.o. female with medical history significant of Multiple recent hospital stays in recent months including C. difficile and diverticulitis, GI bleed, hand cellulitis, history of anemia, anxiety, CAD status post stent placement, chronic low back pain, CKD, previous CVA, diverticulosis, GERD, hemorrhoids, hypertension, hyperlipidemia, PAF anticoagulation recently stopped due to falls,GCA, RA.  Pt recently admitted earlier this month (6/28-7/2) after mechanical fall and distal R femur fx.  She was supposed to be NWB 6-8 weeks on the R femur, was in SNF for rehab.  Today fell out of bed while trying to turn off her light.  R femur / knee pain, severe, and in to ED.   ED Course: R distal femur is fractured again at site of prior repair.  Large joint effusion.  Dr. Marcelino Scot notified by EDP, reqs NPO, will see in AM, presumably to OR again.   Review of Systems: As per HPI, otherwise all review of systems negative.  Past Medical History:  Diagnosis Date  . Acute respiratory failure with hypoxia ([place]) 05/20/2017  . Anemia, unspecified 10/28/2012  . Anxiety state, unspecified 10/28/2012  . CAD (coronary artery disease)    a. Stent RCA in [facility];  b. Cath approx 2009 - nonobs per pt report.  . Cataract    immature on the left eye  . Chronic insomnia 02/07/2013  . Chronic lower back pain    scoliosis  . CKD (chronic kidney disease) stage 3, GFR 30-59 ml/min   . Complication of anesthesia    pt has a very high tolerance to meds  . CVA (cerebral infarction) 10/29/2012  . DDD (degenerative disc disease)   . Depression   . Diverticulitis    hx of  . Diverticulosis   . Enteritis   . GERD (gastroesophageal  reflux disease) 09/01/2010  . Giant cell arteritis ([place])   . Hemorrhoids   . Herniated nucleus pulposus, L5-S1, right 11/04/2015  . History of scabies   . HTN (hypertension) 05/20/2017  . Hyperlipidemia    takes Lipitor daily  . Hypertension    takes Amlodipine,Losartan,Metoprolol,and HCTZ daily  . Incontinence of urine   . Insomnia    takes Restoril nightly  . Lumbar stenosis 04/25/2013  . Major depressive disorder, recurrent episode, moderate ([place]) 07/16/2013  . Migraines    "back in my 20's; none since" (10/28/2012)  . Osteoporosis   . PAF (paroxysmal atrial fibrillation) ([place]) 2011   a. lone epidode in 2011 according to notes.  . Rheumatoid arthritis ([place])   . Scoliosis   . Sepsis ([place]) 05/2019  . Sinus bradycardia    a. on chronic bb  . Temporal arteritis ([place]) 2011   a. followed @ [facility]; potential flareup 10/28/2012/notes 10/28/2012  . Vocal cord dysfunction    "they don't operate properly" (10/28/2012)    Past Surgical History:  Procedure Laterality Date  . ABDOMINAL HYSTERECTOMY  ~ 1984   vaginally  . BACK SURGERY  7-59yrs ago   X Stop  . BLADDER SUSPENSION  2001  . BREAST BIOPSY Right   . CATARACT EXTRACTION W/ INTRAOCULAR LENS IMPLANT Right ~ 08/2012  . CHEST TUBE INSERTION Left 03/08/2019   Procedure: Chest Tube Insertion;  Surgeon: Ivin Poot, MD;  Location: [place] OR;  Service: Thoracic;  Laterality: Left;  . COLONOSCOPY  01/26/2012   Procedure: COLONOSCOPY;  Surgeon: Ladene Artist, MD,FACG;  Location: [place] [facility]COPY;  Service: Endoscopy;  Laterality: N/A;  note the EGD is possible  . CORONARY ANGIOPLASTY WITH STENT PLACEMENT  2006   X 1 stent  . EPIDURAL BLOCK INJECTION    . ESOPHAGOGASTRODUODENOSCOPY  01/26/2012   Procedure: ESOPHAGOGASTRODUODENOSCOPY (EGD);  Surgeon: Ladene Artist, MD,FACG;  Location: [facility] ENDOSCOPY;  Service: Endoscopy;  Laterality: N/A;  . FINE NEEDLE ASPIRATION Right 09/26/2019   Procedure: FINE NEEDLE ASPIRATION;  Surgeon: Altamese [place], MD;   Location: [place];  Service: Orthopedics;  Laterality: Right;  . HEMIARTHROPLASTY HIP Right 2012  . IR EPIDUROGRAPHY  04/20/2019  . LAPAROSCOPIC CHOLECYSTECTOMY  2001  . LUMBAR FUSION  03/2013  . LUMBAR LAMINECTOMY/DECOMPRESSION MICRODISCECTOMY Right 11/04/2015   Procedure: Right Lumbar Five-Sacral One Microdiskectomy;  Surgeon: Kristeen Miss, MD;  Location: [place] NEURO ORS;  Service: Neurosurgery;  Laterality: Right;  Right L5-S1 Microdiskectomy  . moles removed that required stiches     one on leg and one on face  . ORIF FEMUR FRACTURE Right 09/26/2019   Procedure: OPEN REDUCTION INTERNAL FIXATION (ORIF) DISTAL FEMUR FRACTURE;  Surgeon: Altamese [place], MD;  Location: [place];  Service: Orthopedics;  Laterality: Right;  . SUBXYPHOID PERICARDIAL WINDOW N/A 03/08/2019   Procedure: SUBXYPHOID PERICARDIAL WINDOW;  Surgeon: Ivin Poot, MD;  Location: [place];  Service: Thoracic;  Laterality: N/A;  . TEE WITHOUT CARDIOVERSION N/A 03/08/2019   Procedure: TRANSESOPHAGEAL ECHOCARDIOGRAM (TEE);  Surgeon: Prescott Gum, Collier Salina, MD;  Location: [place];  Service: Thoracic;  Laterality: N/A;  . TEMPORAL ARTERY BIOPSY / LIGATION Bilateral 2011  . TONSILLECTOMY AND ADENOIDECTOMY     at age 67  . TUBAL LIGATION  ~ 1982  . X-STOP IMPLANTATION  ~ 2010   "lower back" (10/28/2012)     reports that she has never smoked. She has never used smokeless tobacco. She reports current alcohol use. She reports that she does not use drugs.  Allergies  Allergen Reactions  . Dextromethorphan Rash    Family History  Problem Relation Age of Onset  . Brain cancer Mother 66  . Heart attack Father   . Breast cancer Daughter 59  . Heart disease Other   . Hypertension Other   . Arthritis Neg Hx   . Colon cancer Neg Hx   . Osteoporosis Neg Hx      Prior to Admission medications   Medication Sig Start Date End Date Taking? Authorizing Provider  ALPRAZolam Duanne Moron) 0.5 MG tablet Take 1 tablet (0.5 mg total) by mouth at bedtime as  needed for sleep. 09/29/19  Yes Ghimire, Henreitta Leber, MD  buPROPion (WELLBUTRIN SR) 150 MG 12 hr tablet Take 150 mg by mouth at bedtime.   Yes [provider]  cholecalciferol (VITAMIN D) 25 MCG tablet Take 2 tablets (2,000 Units total) by mouth 2 (two) times daily. 09/29/19  Yes Ainsley Spinner, PA-C  diltiazem (CARDIZEM CD) 240 MG 24 hr capsule Take 1 capsule (240 mg total) by mouth daily. 07/19/19  Yes Bhagat, Bhavinkumar, PA  enoxaparin (LOVENOX) 30 MG/0.3ML injection Inject 0.3 mLs (30 mg total) into the skin daily for 21 days. 09/29/19 10/20/19 Yes Ainsley Spinner, PA-C  escitalopram (LEXAPRO) 10 MG tablet TAKE 1 TABLET BY MOUTH EVERY DAY Patient taking differently: Take 10 mg by mouth daily.  12/14/18  Yes Burchette, Alinda Sierras, MD  famotidine (PEPCID) 20 MG tablet  Take 1 tablet (20 mg total) by mouth daily. 06/02/19  Yes Eugenie Filler, MD  fentaNYL (DURAGESIC) 12 MCG/HR Place 1 patch onto the skin every 3 (three) days. 09/29/19  Yes Ghimire, Henreitta Leber, MD  ferrous sulfate 325 (65 FE) MG tablet Take 1 tablet (325 mg total) by mouth 2 (two) times daily with a meal. 04/21/19  Yes Darrick Meigs, Marge Duncans, MD  furosemide (LASIX) 40 MG tablet Take 1 tablet (40 mg total) by mouth daily. 06/03/19  Yes Eugenie Filler, MD  HYDROcodone-acetaminophen [facility]) 10-325 MG tablet Take one tablet by mouth every 8 hours as needed for breakthrough pain. 09/29/19  Yes Ghimire, Henreitta Leber, MD  LACTOBACILLUS PO Take 2 capsules by mouth 3 (three) times daily.   Yes [provider]  metoprolol tartrate (LOPRESSOR) 50 MG tablet Take 50 mg by mouth 2 (two) times daily.   Yes [provider]  potassium chloride SA (KLOR-CON) 10 MEQ tablet Take 1 tablet (10 mEq total) by mouth daily. 04/21/19  Yes Oswald Hillock, MD  predniSONE (DELTASONE) 10 MG tablet 20 mg daily x 4 days, then back to 10 mg daily Patient taking differently: Take 10 mg by mouth daily with breakfast.  09/29/19  Yes Ghimire, Henreitta Leber, MD  senna-docusate  (SENOKOT-S) 8.6-50 MG tablet Take 1 tablet by mouth at bedtime.   Yes [provider]  vitamin C (ASCORBIC ACID) 500 MG tablet Take 1,000 mg by mouth daily.    Yes [provider]  vitamin E (VITAMIN E) 400 UNIT capsule Take 400 Units by mouth daily.   Yes [provider]    Physical Exam: Vitals:   10/17/19 0101 10/17/19 0204 10/17/19 0245 10/17/19 0300  BP:  (!) 216/77 (!) 196/77 (!) 143/91  Pulse:  66 64 67  Resp:  18 (!) 27 17  Temp:      TempSrc:      SpO2:  99% 97% 97%  Weight: 68 kg     Height: 5\' 4"  (1.626 m)       Constitutional: NAD, calm, comfortable Eyes: PERRL, lids and conjunctivae normal ENMT: Mucous membranes are moist. Posterior pharynx clear of any exudate or lesions.Normal dentition.  Neck: normal, supple, no masses, no thyromegaly Respiratory: clear to auscultation bilaterally, no wheezing, no crackles. Normal respiratory effort. No accessory muscle use.  Cardiovascular: Regular rate and rhythm, no murmurs / rubs / gallops. No extremity edema. 2+ pedal pulses. No carotid bruits.  Abdomen: no tenderness, no masses palpated. No hepatosplenomegaly. Bowel sounds positive.  Musculoskeletal: no clubbing / cyanosis. No joint deformity upper and lower extremities. Good ROM, no contractures. Normal muscle tone.  Skin: no rashes, lesions, ulcers. No induration Neurologic: CN 2-12 grossly intact. Sensation intact, DTR normal. Strength 5/5 in all 4.  Psychiatric: Normal judgment and insight. Alert and oriented x 3. Normal mood.    Labs on Admission: I have personally reviewed following labs and imaging studies  CBC: Recent Labs  Lab 10/17/19 0152 10/17/19 0212  WBC 11.3*  --   NEUTROABS 8.0*  --   HGB 11.0* 11.9*  HCT 35.6* 35.0*  MCV 94.4  --   PLT 239  --    Basic Metabolic Panel: Recent Labs  Lab 10/17/19 0212  NA 139  K 4.3  CL 99  GLUCOSE 96  BUN 39*  CREATININE 1.30*   GFR: Estimated Creatinine Clearance: 31.1 mL/min  (A) (by C-G formula based on SCr of 1.3 mg/dL (H)). Liver Function Tests: No results  for input(s): AST, ALT, ALKPHOS, BILITOT, PROT, ALBUMIN in the last 168 hours. No results for input(s): LIPASE, AMYLASE in the last 168 hours. No results for input(s): AMMONIA in the last 168 hours. Coagulation Profile: No results for input(s): INR, PROTIME in the last 168 hours. Cardiac Enzymes: No results for input(s): CKTOTAL, CKMB, CKMBINDEX, TROPONINI in the last 168 hours. BNP (last 3 results) No results for input(s): PROBNP in the last 8760 hours. HbA1C: No results for input(s): HGBA1C in the last 72 hours. CBG: No results for input(s): GLUCAP in the last 168 hours. Lipid Profile: No results for input(s): CHOL, HDL, LDLCALC, TRIG, CHOLHDL, LDLDIRECT in the last 72 hours. Thyroid Function Tests: No results for input(s): TSH, T4TOTAL, FREET4, T3FREE, THYROIDAB in the last 72 hours. Anemia Panel: No results for input(s): VITAMINB12, FOLATE, FERRITIN, TIBC, IRON, RETICCTPCT in the last 72 hours. Urine analysis:    Component Value Date/Time   COLORURINE STRAW (A) 09/25/2019 1636   APPEARANCEUR CLEAR 09/25/2019 1636   LABSPEC 1.015 09/25/2019 1636   PHURINE 7.0 09/25/2019 1636   GLUCOSEU NEGATIVE 09/25/2019 1636   GLUCOSEU NEGATIVE 07/31/2016 1018   HGBUR MODERATE (A) 09/25/2019 1636   BILIRUBINUR NEGATIVE 09/25/2019 1636   BILIRUBINUR neg 07/31/2016 1012   KETONESUR NEGATIVE 09/25/2019 1636   PROTEINUR NEGATIVE 09/25/2019 1636   UROBILINOGEN 0.2 07/31/2016 1018   UROBILINOGEN 0.2 07/31/2016 1012   NITRITE NEGATIVE 09/25/2019 1636   LEUKOCYTESUR NEGATIVE 09/25/2019 1636    Radiological Exams on Admission: CT Head Wo Contrast  Result Date: 10/17/2019 CLINICAL DATA:  Status post fall. EXAM: CT HEAD WITHOUT CONTRAST TECHNIQUE: Contiguous axial images were obtained from the base of the skull through the vertex without intravenous contrast. COMPARISON:  June 20, 2019 FINDINGS: Brain: There is  mild cerebral atrophy with widening of the extra-axial spaces and ventricular dilatation. There are areas of decreased attenuation within the white matter tracts of the supratentorial brain, consistent with microvascular disease changes. Vascular: No hyperdense vessel or unexpected calcification. Skull: Normal. Negative for fracture or focal lesion. Sinuses/Orbits: No acute finding. Other: None. IMPRESSION: 1. Generalized cerebral atrophy. 2. No acute intracranial abnormality. Electronically Signed   By: Virgina Norfolk M.D.   On: 10/17/2019 03:05   CT Cervical Spine Wo Contrast  Result Date: 10/17/2019 CLINICAL DATA:  Status post fall. EXAM: CT CERVICAL SPINE WITHOUT CONTRAST TECHNIQUE: Multidetector CT imaging of the cervical spine was performed without intravenous contrast. Multiplanar CT image reconstructions were also generated. COMPARISON:  None. FINDINGS: Alignment: There is approximately 1 mm anterolisthesis of the C3 vertebral body on C4. 2 mm to 3 mm anterolisthesis of the C4 vertebral body on C5 is noted. Skull base and vertebrae: No acute fracture. No primary bone lesion or focal pathologic process. Soft tissues and spinal canal: No prevertebral fluid or swelling. No visible canal hematoma. Disc levels: Moderate to marked severity endplate sclerosis is seen at the levels of C5-C6 and C6-C7. Moderate to marked severity intervertebral disc space narrowing is also seen at these levels. Marked severity posterior intervertebral disc space narrowing is seen at the level of C3-C4. Marked severity bilateral multilevel facet joint hypertrophy is noted. Upper chest: Negative. Other: None. IMPRESSION: 1. No acute fracture within the cervical spine. 2. Marked severity bilateral multilevel facet joint hypertrophy. 3. Moderate to marked severity degenerative changes at the levels of C5-C6 and C6-C7. 4. 2 mm to 3 mm anterolisthesis of the C3 vertebral body on C4 and C4 vertebral body on C5. Electronically Signed  By: Virgina Norfolk M.D.   On: 10/17/2019 03:07   Chest Portable 1 View  Result Date: 10/17/2019 CLINICAL DATA:  Preoperative evaluation. EXAM: PORTABLE CHEST 1 VIEW COMPARISON:  September 25, 2019 FINDINGS: Mild, diffuse chronic appearing increased lung markings are seen without evidence of acute infiltrate, pleural effusion or pneumothorax. A radiopaque surgical suture is seen overlying the medial aspect of the left lung base. This is present on the prior exam. The cardiac silhouette is mildly enlarged and unchanged in size. There is evidence of prior vertebroplasty within the lower thoracic spine. IMPRESSION: Chronic appearing increased lung markings without evidence of acute or active cardiopulmonary disease. Electronically Signed   By: Virgina Norfolk M.D.   On: 10/17/2019 03:20   DG Knee Complete 4 Views Right  Result Date: 10/17/2019 CLINICAL DATA:  Pain EXAM: RIGHT KNEE - COMPLETE 4+ VIEW COMPARISON:  None. FINDINGS: There is an acute displaced and angulated fracture of the right femoral metadiaphysis. There is a large suprapatellar joint effusion. The patient is status post prior plate screw fixation of the distal femur. There are 2 transcortical screws coursing through the femoral condyles which appear grossly intact. There are advanced degenerative changes of the knee. IMPRESSION: 1. Acute displaced and angulated fracture of the right femoral metadiaphysis. 2. Large suprapatellar joint effusion. 3. Advanced degenerative changes of the right knee. Electronically Signed   By: Constance Holster M.D.   On: 10/17/2019 02:13   DG Hip Unilat W or Wo Pelvis 2-3 Views Left  Result Date: 10/17/2019 CLINICAL DATA:  Pain EXAM: DG HIP (WITH OR WITHOUT PELVIS) 2-3V LEFT COMPARISON:  Aug 11, 2019 FINDINGS: The patient is status post total hip arthroplasty on the right. There is some mild, relatively stable lucency about the acetabular cup. There are healing fractures of the left inferior pubic ramus. There  are degenerative changes of the left hip. There is osteopenia which limits detection of nondisplaced fractures. IMPRESSION: 1. No acute displaced fracture or dislocation. 2. Healing fracture of the left inferior pubic ramus. 3. Status post total hip arthroplasty on the right. 4. Osteopenia which limits detection of nondisplaced fractures. Electronically Signed   By: Constance Holster M.D.   On: 10/17/2019 02:11   DG Hip Unilat W or Wo Pelvis 2-3 Views Right  Result Date: 10/17/2019 CLINICAL DATA:  Hip pain EXAM: DG HIP (WITH OR WITHOUT PELVIS) 2-3V RIGHT COMPARISON:  May 2021 FINDINGS: The patient is status post total hip arthroplasty on the right. The hardware appears intact. There is some lucency about the acetabular cup which may indicate underlying loosening. There is no acute displaced fracture. IMPRESSION: No acute abnormality. Electronically Signed   By: Constance Holster M.D.   On: 10/17/2019 02:12   DG Femur Min 2 Views Right  Result Date: 10/17/2019 CLINICAL DATA:  Pain EXAM: RIGHT FEMUR 2 VIEWS COMPARISON:  None. FINDINGS: The patient has undergone prior plate and screw fixation of the distal femur at the level of the metadiaphysis. At this level, there is a new acute displaced fracture with significant associated angulation. There are 2 transcortical screws through the femoral condyles that appear intact. There are degenerative changes of the knee. There is a large joint effusion. Vascular calcifications are noted. There is soft tissue swelling. IMPRESSION: 1. Acute displaced fracture of the distal femur at the level of the patient's prior plate and screw fixation. 2. Large joint effusion. 3. Advanced degenerative changes of the right knee. Electronically Signed   By: Jamie Kato.D.  On: 10/17/2019 02:08    EKG: Independently reviewed.  Assessment/Plan Principal Problem:   Closed fracture of right distal femur (HCC) Active Problems:   Major depressive disorder, recurrent  episode, moderate (HCC)   PAF (paroxysmal atrial fibrillation) (HCC)   HTN (hypertension)   Giant cell arteritis (HCC)   Chronic diastolic CHF (congestive heart failure) (HCC)    1. Closed fx of R distal femur - for second time in 3 weeks 1. Hip fx pathway 2. Pain ctrl per pathway 3. Will cont the low dose fentanyl patch too 4. EKG and CXR for pre-op 5. But if neg, presumably can just go to OR since shes only ~20 days out from prior operation. 6. Dr. Marcelino Scot reqs NPO and will see in AM 2. GCA - 1. On chronic prednisone 10mg  currently for a slow dose taper 2. Will leave her at the 10mg  dose for the moment 3. Defer any increase needed for surgery to anesthesia 3. Chronic diastolic CHF - 1. Cont home BP meds 2. Resume home lasix starting 7/21 4. HTN - 1. Cont home BP meds 5. PAF - 1. Cont cardizem and metoprolol for rate control 2. Cards took her off of eliquis last admit due to fall risk 6. Depression - 1. Cont lexapro 2. Cont QHS PRN xanax 3. Daughter reqs she be taken off of the welbutrin which was just started at SNF  DVT prophylaxis: SCDs Code Status: DNR Family Communication: Daughter at bedside Disposition Plan: SNF after admit Consults called: Dr. Marcelino Scot Admission status: Admit to inpatient  Severity of Illness: The appropriate patient status for this patient is INPATIENT. Inpatient status is judged to be reasonable and necessary in order to provide the required intensity of service to ensure the patient's safety. The patient's presenting symptoms, physical exam findings, and initial radiographic and laboratory data in the context of their chronic comorbidities is felt to place them at high risk for further clinical deterioration. Furthermore, it is not anticipated that the patient will be medically stable for discharge from the hospital within 2 midnights of admission. The following factors support the patient status of inpatient.   IP status due to distal femur fx  requiring operative repair.   * I certify that at the point of admission it is my clinical judgment that the patient will require inpatient hospital care spanning beyond 2 midnights from the point of admission due to high intensity of service, high risk for further deterioration and high frequency of surveillance required.*    Tyren Dugar M. DO [facility]  How to contact the [facility] Attending or Consulting provider [place] or covering provider during after hours [place], for this patient?  1. Check the care team in [employer] and look for a) attending/consulting [facility] provider listed and b) the [facility] team listed 2. Log into [email or web address]  Amion Physician Scheduling and messaging for groups and whole hospitals  On call and physician scheduling software for group practices, residents, hospitalists and other medical providers for call, clinic, rotation and shift schedules. On[employer] is a hospital-wide system for scheduling doctors and paging doctors on call. EasyPlot is for scientific plotting and data analysis.  [email or web address]  and use [place]'s universal password to access. If you do not have the password, please contact the hospital operator.  3. Locate the [facility] provider you are looking for under [facility] and page to a number that you can be directly reached. 4. If you still have difficulty reaching the provider, please page  the [facility] (Director on Call) for the Hospitalists listed on amion for assistance.  10/17/2019, 3:27 AM

## 2019-10-17 NOTE — Progress Notes (Signed)
Rings x 2 and glasses given to Ava from [facility]

## 2019-10-17 NOTE — ED Triage Notes (Signed)
BIB EMS from [facility]. Patient had mechanical fall while trying to turn off overhead light. Patient recently seen 09/25/19 after a fall with R femur fracture and ORIF on 09/26/19. Patient is A/OX4. Hypertensive.

## 2019-10-18 ENCOUNTER — Encounter (HOSPITAL_COMMUNITY): Payer: Self-pay | Admitting: Orthopedic Surgery

## 2019-10-18 ENCOUNTER — Other Ambulatory Visit: Payer: Self-pay

## 2019-10-18 DIAGNOSIS — L8990 Pressure ulcer of unspecified site, unspecified stage: Secondary | ICD-10-CM | POA: Insufficient documentation

## 2019-10-18 LAB — CBC WITH DIFFERENTIAL/PLATELET
Abs Immature Granulocytes: 0.04 10*3/uL (ref 0.00–0.07)
Basophils Absolute: 0 10*3/uL (ref 0.0–0.1)
Basophils Relative: 0 %
Eosinophils Absolute: 0 10*3/uL (ref 0.0–0.5)
Eosinophils Relative: 0 %
HCT: 26.1 % — ABNORMAL LOW (ref 36.0–46.0)
Hemoglobin: 8.1 g/dL — ABNORMAL LOW (ref 12.0–15.0)
Immature Granulocytes: 0 %
Lymphocytes Relative: 5 %
Lymphs Abs: 0.6 10*3/uL — ABNORMAL LOW (ref 0.7–4.0)
MCH: 29.7 pg (ref 26.0–34.0)
MCHC: 31 g/dL (ref 30.0–36.0)
MCV: 95.6 fL (ref 80.0–100.0)
Monocytes Absolute: 0.5 10*3/uL (ref 0.1–1.0)
Monocytes Relative: 4 %
Neutro Abs: 9.6 10*3/uL — ABNORMAL HIGH (ref 1.7–7.7)
Neutrophils Relative %: 91 %
Platelets: 174 10*3/uL (ref 150–400)
RBC: 2.73 MIL/uL — ABNORMAL LOW (ref 3.87–5.11)
RDW: 15.2 % (ref 11.5–15.5)
WBC: 10.7 10*3/uL — ABNORMAL HIGH (ref 4.0–10.5)
nRBC: 0 % (ref 0.0–0.2)

## 2019-10-18 LAB — VITAMIN D 25 HYDROXY (VIT D DEFICIENCY, FRACTURES): Vit D, 25-Hydroxy: 27.8 ng/mL — ABNORMAL LOW (ref 30–100)

## 2019-10-18 LAB — BASIC METABOLIC PANEL
Anion gap: 8 (ref 5–15)
BUN: 13 mg/dL (ref 8–23)
CO2: 28 mmol/L (ref 22–32)
Calcium: 8.3 mg/dL — ABNORMAL LOW (ref 8.9–10.3)
Chloride: 104 mmol/L (ref 98–111)
Creatinine, Ser: 0.79 mg/dL (ref 0.44–1.00)
GFR calc Af Amer: >60 mL/min (ref >60)
GFR calc non Af Amer: >60 mL/min (ref >60)
Glucose, Bld: 157 mg/dL — ABNORMAL HIGH (ref 70–99)
Potassium: 4.5 mmol/L (ref 3.5–5.1)
Sodium: 140 mmol/L (ref 135–145)

## 2019-10-18 MED ORDER — PANTOPRAZOLE SODIUM 40 MG PO TBEC
40.0000 mg | DELAYED_RELEASE_TABLET | Freq: Every day | ORAL | Status: DC
  Administered 2019-10-18 – 2019-10-23 (×6): 40 mg via ORAL
  Filled 2019-10-18 (×6): qty 1

## 2019-10-18 MED ORDER — ORAL CARE MOUTH RINSE
15.0000 mL | Freq: Two times a day (BID) | OROMUCOSAL | Status: DC
  Administered 2019-10-18 – 2019-10-23 (×10): 15 mL via OROMUCOSAL

## 2019-10-18 MED ORDER — ADULT MULTIVITAMIN W/MINERALS CH
1.0000 | ORAL_TABLET | Freq: Every day | ORAL | Status: DC
  Administered 2019-10-18 – 2019-10-23 (×6): 1 via ORAL
  Filled 2019-10-18 (×6): qty 1

## 2019-10-18 NOTE — Progress Notes (Signed)
Orthopaedic Trauma Service Progress Note  Patient ID: Betty Jackson MRN: [id number] DOB/AGE: 83-Jul-1936 63 y.o.  Subjective:  Pain tolerable Doing ok post op  No acute complaints    ROS As above  Objective:   VITALS:   Vitals:   10/17/19 2010 10/17/19 2041 10/18/19 0338 10/18/19 0749  BP: (!) 162/61 (!) 114/51 (!) 156/68 (!) 144/47  Pulse: 80 87 80 95  Resp: 17 18 17 16   Temp: 98.3 F (36.8 C) 98 F (36.7 C) (!) 97.5 F (36.4 C) 98.8 F (37.1 C)  TempSrc:  Oral Oral Oral  SpO2: 94% 94% 99% 98%  Weight:      Height:        Estimated body mass index is 25.75 kg/m as calculated from the following:   Height as of this encounter: 5\' 4"  (1.626 m).   Weight as of this encounter: 68 kg.   Intake/Output      07/20 0701 - 07/21 0700 07/21 0701 - 07/22 0700   I.V. (mL/kg) 1610.4 (23.7)    IV Piggyback 600    Total Intake(mL/kg) 2210.4 (32.5)    Blood 200    Total Output 200    Net +2010.4         Urine Occurrence 1 x 1 x   Stool Occurrence  1 x     LABS  Results for orders placed or performed during the hospital encounter of 10/17/19 (from the past 24 hour(s))  Surgical pcr screen     Status: None   Collection Time: 10/17/19  1:20 PM   Specimen: Nasal Mucosa; Nasal Swab  Result Value Ref Range   MRSA, PCR NEGATIVE NEGATIVE   Staphylococcus aureus NEGATIVE NEGATIVE  Creatinine, serum     Status: None   Collection Time: 10/17/19  9:59 PM  Result Value Ref Range   Creatinine, Ser 0.84 0.44 - 1.00 mg/dL   GFR calc non Af Amer >60 >60 mL/min   GFR calc Af Amer >60 >60 mL/min  Basic metabolic panel     Status: Abnormal   Collection Time: 10/18/19  3:10 AM  Result Value Ref Range   Sodium 140 135 - 145 mmol/L   Potassium 4.5 3.5 - 5.1 mmol/L   Chloride 104 98 - 111 mmol/L   CO2 28 22 - 32 mmol/L   Glucose, Bld 157 (H) 70 - 99 mg/dL   BUN 13 8 - 23 mg/dL   Creatinine, Ser 0.79 0.44 -  1.00 mg/dL   Calcium 8.3 (L) 8.9 - 10.3 mg/dL   GFR calc non Af Amer >60 >60 mL/min   GFR calc Af Amer >60 >60 mL/min   Anion gap 8 5 - 15  CBC with Differential/Platelet     Status: Abnormal   Collection Time: 10/18/19  3:10 AM  Result Value Ref Range   WBC 10.7 (H) 4.0 - 10.5 K/uL   RBC 2.73 (L) 3.87 - 5.11 MIL/uL   Hemoglobin 8.1 (L) 12.0 - 15.0 g/dL   HCT 26.1 (L) 36 - 46 %   MCV 95.6 80.0 - 100.0 fL   MCH 29.7 26.0 - 34.0 pg   MCHC 31.0 30.0 - 36.0 g/dL   RDW 15.2 11.5 - 15.5 %   Platelets 174 150 - 400 K/uL   nRBC 0.0 0.0 - 0.2 %  Neutrophils Relative % 91 %   Neutro Abs 9.6 (H) 1.7 - 7.7 K/uL   Lymphocytes Relative 5 %   Lymphs Abs 0.6 (L) 0.7 - 4.0 K/uL   Monocytes Relative 4 %   Monocytes Absolute 0.5 0 - 1 K/uL   Eosinophils Relative 0 %   Eosinophils Absolute 0.0 0 - 0 K/uL   Basophils Relative 0 %   Basophils Absolute 0.0 0 - 0 K/uL   Immature Granulocytes 0 %   Abs Immature Granulocytes 0.04 0.00 - 0.07 K/uL     PHYSICAL EXAM:   Gen: resting comfortably in bed, NAD Lungs: unlabored Ext:       Right Lower Extremity   Dressing c/d/i  Ext warm   + DP pulse  Swelling controlled  DPN, SPN, TN sensation intact  EHL, FHL, lesser toe motor intact  Ankle flexion, extension, inversion and eversion intact  No DCT  Compartments soft   Assessment/Plan: 1 Day Post-Op   Principal Problem:   Closed fracture of right distal femur (HCC) Active Problems:   Major depressive disorder, recurrent episode, moderate (HCC)   PAF (paroxysmal atrial fibrillation) (HCC)   HTN (hypertension)   Giant cell arteritis (HCC)   Chronic diastolic CHF (congestive heart failure) (HCC)   Pressure injury of skin   Anti-infectives (From admission, onward)   Start     Dose/Rate Route Frequency Ordered Stop   10/17/19 2300  ceFAZolin (ANCEF) IVPB 2g/100 mL premix        2 g 200 mL/hr over 30 Minutes Intravenous Every 6 hours 10/17/19 2037 10/18/19 0531    .  POD/HD#: 1  85  y/o female s/p R femur peri-implant distal femur fracture  -R distal femur fracture peri-implant s/p ROH and ORIF   NWB R leg x 4-6 weeks  Unrestricted ROM R knee  Hx of R THA   PT/OT evals  Dressing change tomorrow   Ice and elevate   Do not let knee rest in flexion    Bone foam or pillows under ankle   - Pain management:  Duragesic patch chronically  norco   - ABL anemia/Hemodynamics  Monitor  Cbc in am   May need transfusion   - Medical issues   Per primary   - DVT/PE prophylaxis:  lovenox x 4 weeks  - ID:   periop abx  - Metabolic Bone Disease:  Repeat vitamin d labs pending   Continue supplements  Fracture liaison consult placed during last admission   - Activity:  NWB R leg   - Impediments to fracture healing:  Frequent falls  Chronic opioids  Vitamin d insufficiency   CKD  - Dispo:  Therapies  SNF when bed available and medically stable (monitor cbc at least another 24 hours)     Jari Pigg, PA-C [phone] (C) 10/18/2019, 9:54 AM  [facility] [place] [address] [phone] Domingo Sep (F)

## 2019-10-18 NOTE — Progress Notes (Signed)
PROGRESS NOTE  Betty Jackson  DOB: [birth year]  PCP: Eulas Post, MD ACZ:[id number]  DOA: 10/17/2019  LOS: 1 day   Chief Complaint  Patient presents with  . Fall   Brief narrative: Betty Jackson is a 85 y.o. female with PMH significant for HTN, HLD, CAD, paroxysmal A. fib not on anticoagulation because of falls, CVA, chronic diastolic CHF, CKD, chronic anemia, chronic low back pain, rheumatoid arthritis, giant cell arteritis, diverticulosis, GERD, hemorrhoids, anxiety. She also had multiple recent hospitalizations for various problems including C. Difficile, diverticulitis, GI bleed, hand cellulitis. Patient was recently admitted earlier this month (6/28-7/2) after mechanical fall and distal R femur fx. She was supposed to be NWB 6-8 weeks on the R femur, was in SNF for rehab. On 7/20, last night, patient fell out of bed while trying to turn off her light.  She complained severe pain on the right femur/knee area and was hence brought to the ED.  Imaging in the ED noted right distal femur is fractured again at site of prior repair along with large joint effusion. Orthopedic surgeon Dr. Marcelino Scot was notified by EDP. Patient was admitted under hospitalist service for further evaluation and management  Subjective: Patient was seen and examined this morning in the ED.   Propped up in bed.  Not in distress.  Pain controlled. Chart reviewed.  Blood pressure in 160s, Labs from this morning with creatinine improved to 0.79, WBC count down to 10.7 and hemoglobin down to 8.1  Assessment/Plan: Closed fx of R distal femur - for second time in 3 weeks -7/20, patient underwent ORIF. -Currently pain controlled on fentanyl patch and Norco as needed. -Lovenox for DVT prophylaxis.  AKI -Creatinine elevated 1.3 on admission.  Baseline less than 1. -With IV normal saline, creatinine is down to normal at 0.79.  Okay to stop IV fluid today.  Anticipated drop in hemoglobin post surgical -Baseline  hemoglobin more than 10.  Hemoglobin down at 8.1 today. -Expected to improve gradually.  Continue to monitor.  Cardiovascular issues: HTN, HLD, CAD, pAfib, CVA, chronic diastolic CHF -Home meds include Cardizem 240 mg daily, Lasix 40 mg daily with potassium supplement, metoprolol 50 mg twice daily.  Not on anticoagulation reportedly because of falls.  I do not see statin in the list. -Continue Cardizem and metoprolol.  Continue to keep Lasix and potassium supplement on hold. -If blood pressure continues to trend up and creatinine remains stable, will resume Lasix tomorrow.  GCA/RA -On chronic prednisone 10mg  currently for a slow dose taper -Continue prednisone 10 mg daily. -Start Protonix while on steroids and Lovenox.  Anxiety/depression -Cont lexapro -Cont QHS PRN xanax -Daughter requested she be taken off of the welbutrin which was just started at SNF  Mobility: Needs PT eval postprocedure Code Status:   Code Status: DNR  Nutritional status: Body mass index is 25.75 kg/m.     Diet Order            Diet regular Room service appropriate? Yes; Fluid consistency: Thin  Diet effective now                 DVT prophylaxis: enoxaparin (LOVENOX) injection 30 mg Start: 10/18/19 0800 SCDs Start: 10/17/19 2038 SCDs Start: 10/17/19 0304   Antimicrobials:  None Fluid: Okay to stop IV fluid  Consultants: Orthopedics Family Communication:  no one bedside  Status is: Inpatient  Remains inpatient appropriate because:Ongoing active pain requiring inpatient pain management and IV treatments appropriate due to intensity of illness  or inability to take PO   Dispo: The patient is from: SNF              Anticipated d/c is to: SNF              Anticipated d/c date is: 2 days              Patient currently is not medically stable to d/c.  Infusions:    Scheduled Meds: . diltiazem  240 mg Oral Daily  . docusate sodium  100 mg Oral BID  . enoxaparin (LOVENOX) injection  30 mg  Subcutaneous Q24H  . escitalopram  10 mg Oral Daily  . famotidine  20 mg Oral Daily  . fentaNYL  1 patch Transdermal Q72H  . ferrous sulfate  325 mg Oral BID WC  . mouth rinse  15 mL Mouth Rinse BID  . metoprolol tartrate  50 mg Oral BID  . potassium chloride  10 mEq Oral Daily  . predniSONE  10 mg Oral Q breakfast  . senna-docusate  1 tablet Oral QHS    Antimicrobials: Anti-infectives (From admission, onward)   Start     Dose/Rate Route Frequency Ordered Stop   10/17/19 2300  ceFAZolin (ANCEF) IVPB 2g/100 mL premix        2 g 200 mL/hr over 30 Minutes Intravenous Every 6 hours 10/17/19 2037 10/18/19 1038      PRN meds: HYDROcodone-acetaminophen, menthol-cetylpyridinium **OR** phenol, metoCLOPramide **OR** metoCLOPramide (REGLAN) injection, morphine injection, ondansetron **OR** ondansetron (ZOFRAN) IV   Objective: Vitals:   10/18/19 0338 10/18/19 0749  BP: (!) 156/68 (!) 144/47  Pulse: 80 95  Resp: 17 16  Temp: (!) 97.5 F (36.4 C) 98.8 F (37.1 C)  SpO2: 99% 98%    Intake/Output Summary (Last 24 hours) at 10/18/2019 1042 Last data filed at 10/18/2019 0400 Gross per 24 hour  Intake 2210.38 ml  Output 200 ml  Net 2010.38 ml   Filed Weights   10/17/19 0101  Weight: 68 kg   Weight change:  Body mass index is 25.75 kg/m.   Physical Exam: General exam: Appears calm and comfortable.  Pain controlled. Skin: No rashes, lesions or ulcers. HEENT: Atraumatic, normocephalic, supple neck, no obvious bleeding Lungs: Clear to auscultation bilaterally CVS: Regular rate and rhythm, no murmur GI/Abd soft, nontender, nondistended, bowel sound present CNS: Alert, awake, oriented x3. Psychiatry: Cheerful. Extremities: No pedal edema, no calf tenderness.  Right leg has ACE wrap on.  Data Review: I have personally reviewed the laboratory data and studies available.  Recent Labs  Lab 10/17/19 0152 10/17/19 0212 10/18/19 0310  WBC 11.3*  --  10.7*  NEUTROABS 8.0*  --  9.6*   HGB 11.0* 11.9* 8.1*  HCT 35.6* 35.0* 26.1*  MCV 94.4  --  95.6  PLT 239  --  174   Recent Labs  Lab 10/17/19 0212 10/17/19 2159 10/18/19 0310  NA 139  --  140  K 4.3  --  4.5  CL 99  --  104  CO2  --   --  28  GLUCOSE 96  --  157*  BUN 39*  --  13  CREATININE 1.30* 0.84 0.79  CALCIUM  --   --  8.3*   Signed, Terrilee Croak, MD [facility] Pager: [phone] (Secure Chat preferred). 10/18/2019

## 2019-10-18 NOTE — Progress Notes (Signed)
Initial Nutrition Assessment  DOCUMENTATION CODES:   Not applicable  INTERVENTION:   -Magic cup BID with meals, each supplement provides 290 kcal and 9 grams of protein -MVI with minerals daily  NUTRITION DIAGNOSIS:   Increased nutrient needs related to post-op healing as evidenced by estimated needs.  GOAL:   Patient will meet greater than or equal to 90% of their needs  MONITOR:   PO intake, Supplement acceptance, Diet advancement, Labs, Weight trends, Skin, I & O's  REASON FOR ASSESSMENT:   Consult Hip fracture protocol  ASSESSMENT:   Betty Jackson is a 85 y.o. female with medical history significant of Multiple recent hospital stays in recent months including C. difficile and diverticulitis, GI bleed, hand cellulitis, history of anemia, anxiety, CAD status post stent placement, chronic low back pain, CKD, previous CVA, diverticulosis, GERD, hemorrhoids, hypertension, hyperlipidemia, PAF anticoagulation recently stopped due to falls, GCA, RA.  Pt admitted with rt distal femur fracture.   7/20- s/p ORIF  Reviewed I/O's: +2 L x 24 hours  Spoke with pt at bedside, who was pleasant and in good spirits today. She reports she usually has a very good appetite but was not very hungry this morning. Noted pt consumed all of her eggs and coffee, as well as about 755 of her french toast. PTA, pt was consuming 3 meals per day, residing in a SNF for short term rehab. Pt would consume almost all of the meals she was served.   Pt denies any weight loss. Noted no wt loss per reviewed of wt hx.   Discussed with pt importance of good meal and supplement intake to promote healing.   Medications reviewed and include colace, prednisone, and 0.9% sodium chloride infusion @ 75 ml/hr.   Labs reviewed.   NUTRITION - FOCUSED PHYSICAL EXAM:    Most Recent Value  Orbital Region No depletion  Upper Arm Region No depletion  Thoracic and Lumbar Region No depletion  Buccal Region No depletion   Temple Region No depletion  Clavicle Bone Region No depletion  Clavicle and Acromion Bone Region No depletion  Scapular Bone Region No depletion  Dorsal Hand No depletion  Patellar Region No depletion  Anterior Thigh Region No depletion  Posterior Calf Region No depletion  Edema (RD Assessment) Mild  Hair Reviewed  Eyes Reviewed  Mouth Reviewed  Skin Reviewed  Nails Reviewed       Diet Order:   Diet Order            Diet regular Room service appropriate? Yes; Fluid consistency: Thin  Diet effective now                 EDUCATION NEEDS:   Education needs have been addressed  Skin:  Skin Assessment: Skin Integrity Issues: Skin Integrity Issues:: Stage II, Incisions Stage II: rt heel Incisions: closed rt leg  Last BM:  Unknown  Height:   Ht Readings from Last 1 Encounters:  10/17/19 5\' 4"  (1.626 m)    Weight:   Wt Readings from Last 1 Encounters:  10/17/19 68 kg    Ideal Body Weight:  54.5 kg  BMI:  Body mass index is 25.75 kg/m.  Estimated Nutritional Needs:   Kcal:  1600-1800  Protein:  80-95 grams  Fluid:  > 1.6 L    Loistine Chance, RD, LDN, [place] Registered Dietitian II Certified Diabetes Care and Education Specialist Please refer to [facility] for RD and/or RD on-call/weekend/after hours pager

## 2019-10-18 NOTE — Evaluation (Signed)
Physical Therapy Evaluation Patient Details Name: Betty Jackson MRN: [id number] DOB: 29-Jun-1934 Today's Date: 10/18/2019   History of Present Illness  Betty Jackson is a 85 y.o. female with PMH significant for HTN, HLD, CAD, paroxysmal A. fib,s, CVA, chronic diastolic CHF, CKD, chronic anemia, chronic low back pain, rheumatoid arthritis, giant cell arteritis, diverticulosis, GERD, hemorrhoids, anxiety. Patient was recently admitted earlier this month (6/28-7/2) after mechanical fall and distal R femur fx. Pt with recurrent fall 7/20 at SNF resulting in closed right distal femur fracture now s/p ORIF.   Clinical Impression  Pt assessed s/p procedure listed above. Patient presents with decreased functional mobility secondary to RLE pain, weakness, decreased activity tolerance, balance deficits and cognitive impairments. Requiring mod assist for bed mobility, deferring transfer to chair due to increase in pain (RN notified). Rest of session focused on bed level exercises for ROM/strengthening. Recommend return to SNF upon discharge.     Follow Up Recommendations SNF    Equipment Recommendations  None recommended by PT    Recommendations for Other Services       Precautions / Restrictions Precautions Precautions: Fall Restrictions Weight Bearing Restrictions: Yes RLE Weight Bearing: Non weight bearing Other Position/Activity Restrictions: Unrestricted R knee ROM      Mobility  Bed Mobility Overal bed mobility: Needs Assistance Bed Mobility: Supine to Sit     Supine to sit: Mod assist Sit to supine: Mod assist   General bed mobility comments: Assist for LLE off edge of bed and use of bed pad to scoot hips forward, pt heavily guarding away from R side. PT supported RLE but pt with sharp increase in pain, requesting return to bed.   Transfers                 General transfer comment: deferred by patient  Ambulation/Gait                Stairs             Wheelchair Mobility    Modified Rankin (Stroke Patients Only)       Balance Overall balance assessment: Needs assistance Sitting-balance support: Bilateral upper extremity supported;Feet supported Sitting balance-[name]: Poor Sitting balance - Comments: reliant on BUE support due to pain                                     Pertinent Vitals/Pain Pain Assessment: Faces Faces Pain Scale: Hurts whole lot Pain Location: RLE with movement Pain Descriptors / Indicators: Aching;Operative site guarding Pain Intervention(s): Limited activity within patient's tolerance;Monitored during session;Patient requesting pain meds-RN notified    Home Living Family/patient expects to be discharged to:: Skilled nursing facility                      Prior Function Level of Independence: Needs assistance   Gait / Transfers Assistance Needed: Prior to most recent admission, pt was a limited household ambulator with RW. Reports she has not been working with therapy at [facility]  ADL's / Homemaking Assistance Needed: Requires assist for ADL's        Hand Dominance   Dominant Hand: Right    Extremity/Trunk Assessment   Upper Extremity Assessment Upper Extremity Assessment: Generalized weakness    Lower Extremity Assessment Lower Extremity Assessment: RLE deficits/detail RLE Deficits / Details: Femur fx s/p ORIF. Supine knee flexion to ~20 degrees, ankle dorsiflexion/plantarflexion 3/5  Communication   Communication: HOH  Cognition Arousal/Alertness: Awake/alert Behavior During Therapy: WFL for tasks assessed/performed Overall Cognitive Status: Impaired/Different from baseline Area of Impairment: Problem solving;Memory;Orientation                 Orientation Level: Situation   Memory: Decreased short-term memory       Problem Solving: Slow processing General Comments: Pt oriented to place, time, not oriented to situation, stating she had  surgery on both legs and was BLE NWB. Reminded pt she only had surgery on RLE and of weightbearing precautions.       General Comments      Exercises General Exercises - Lower Extremity Ankle Circles/Pumps: Both;10 reps;Supine Quad Sets: Right;10 reps;Supine Heel Slides: Right;5 reps;Supine;AAROM   Assessment/Plan    PT Assessment Patient needs continued PT services  PT Problem List Decreased strength;Decreased range of motion;Decreased balance;Decreased mobility;Decreased activity tolerance;Decreased knowledge of use of DME;Decreased knowledge of precautions;Decreased cognition;Pain       PT Treatment Interventions DME instruction;Functional mobility training;Therapeutic activities;Therapeutic exercise;Balance training;Patient/family education;Wheelchair mobility training    PT Goals (Current goals can be found in the Care Plan section)  Acute Rehab PT Goals Patient Stated Goal: less pain PT Goal Formulation: With patient Time For Goal Achievement: 11/01/19 Potential to Achieve Goals: Fair    Frequency Min 3X/week   Barriers to discharge        Co-evaluation               AM-PAC PT "6 Clicks" Mobility  Outcome Measure Help needed turning from your back to your side while in a flat bed without using bedrails?: A Little Help needed moving from lying on your back to sitting on the side of a flat bed without using bedrails?: A Lot Help needed moving to and from a bed to a chair (including a wheelchair)?: Total Help needed standing up from a chair using your arms (e.g., wheelchair or bedside chair)?: Total Help needed to walk in hospital room?: Total Help needed climbing 3-5 steps with a railing? : Total 6 Click Score: 9    End of Session   Activity Tolerance: Patient limited by pain Patient left: in bed;with call bell/phone within reach;with bed alarm set Nurse Communication: Mobility status;Patient requests pain meds PT Visit Diagnosis: Other abnormalities of  gait and mobility (R26.89);Pain Pain - Right/Left: Left Pain - part of body: Leg    Time: 2919-1660 PT Time Calculation (min) (ACUTE ONLY): 21 min   Charges:   PT Evaluation $PT Eval Moderate Complexity: 1 Mod            Wyona Almas, PT, DPT Acute Rehabilitation Services Pager [phone] Office [phone]   Deno Etienne 10/18/2019, 2:48 PM

## 2019-10-18 NOTE — Plan of Care (Signed)

## 2019-10-18 NOTE — Anesthesia Postprocedure Evaluation (Signed)
Anesthesia Post Note  Patient: Betty Jackson  Procedure(s) Performed: OPEN REDUCTION INTERNAL FIXATION (ORIF) DISTAL FEMUR FRACTURE (Right Knee)     Patient location during evaluation: PACU Anesthesia Type: General Level of consciousness: awake and alert Pain management: pain level controlled Vital Signs Assessment: post-procedure vital signs reviewed and stable Respiratory status: spontaneous breathing, nonlabored ventilation, respiratory function stable and patient connected to nasal cannula oxygen Cardiovascular status: blood pressure returned to baseline and stable Postop Assessment: no apparent nausea or vomiting Anesthetic complications: no   No complications documented.  Last Vitals:  Vitals:   10/17/19 2010 10/17/19 2041  BP: (!) 162/61 (!) 114/51  Pulse: 80 87  Resp: 17 18  Temp: 36.8 C 36.7 C  SpO2: 94% 94%    Last Pain:  Vitals:   10/17/19 2041  TempSrc: Oral  PainSc:                  Audry Pili

## 2019-10-19 ENCOUNTER — Other Ambulatory Visit: Payer: Self-pay

## 2019-10-19 LAB — CBC
HCT: 21 % — ABNORMAL LOW (ref 36.0–46.0)
Hemoglobin: 6.4 g/dL — CL (ref 12.0–15.0)
MCH: 29.2 pg (ref 26.0–34.0)
MCHC: 30.5 g/dL (ref 30.0–36.0)
MCV: 95.9 fL (ref 80.0–100.0)
Platelets: 150 10*3/uL (ref 150–400)
RBC: 2.19 MIL/uL — ABNORMAL LOW (ref 3.87–5.11)
RDW: 15.2 % (ref 11.5–15.5)
WBC: 12.1 10*3/uL — ABNORMAL HIGH (ref 4.0–10.5)
nRBC: 0 % (ref 0.0–0.2)

## 2019-10-19 LAB — PREPARE RBC (CROSSMATCH)

## 2019-10-19 LAB — HEMOGLOBIN AND HEMATOCRIT, BLOOD
HCT: 21.1 % — ABNORMAL LOW (ref 36.0–46.0)
Hemoglobin: 6.5 g/dL — CL (ref 12.0–15.0)

## 2019-10-19 MED ORDER — SODIUM CHLORIDE 0.9% IV SOLUTION: Freq: Once | INTRAVENOUS | Status: AC

## 2019-10-19 MED ORDER — SODIUM CHLORIDE 0.9% IV SOLUTION: Freq: Once | INTRAVENOUS | Status: DC

## 2019-10-19 MED ORDER — VITAMIN D 25 MCG (1000 UNIT) PO TABS
2000.0000 [IU] | ORAL_TABLET | Freq: Two times a day (BID) | ORAL | Status: DC
  Administered 2019-10-19 – 2019-10-23 (×8): 2000 [IU] via ORAL
  Filled 2019-10-19 (×8): qty 2

## 2019-10-19 MED ORDER — FUROSEMIDE 40 MG PO TABS
40.0000 mg | ORAL_TABLET | Freq: Every day | ORAL | Status: DC
  Administered 2019-10-19 – 2019-10-23 (×5): 40 mg via ORAL
  Filled 2019-10-19 (×5): qty 1

## 2019-10-19 NOTE — Significant Event (Signed)
Post transfusion hgb is 6.5.  Denies any rectal bleeding, hematemesis.  BUN this am normal. Surgical dressing site intact.  I would give 1 more unit of PRBC.  Repeat CBC in am. D/w RN and patient.

## 2019-10-19 NOTE — Plan of Care (Signed)

## 2019-10-19 NOTE — Progress Notes (Signed)
CRITICAL VALUE ALERT  Critical Value: hbg 6.5  Date & Time Notied:  10/19/19 1525   Provider Notified: Dr.Dahal  Orders Received/Actions taken: see new orders, continue to monitor

## 2019-10-19 NOTE — Progress Notes (Signed)
PROGRESS NOTE  Betty Jackson  DOB: 1935/02/18  PCP: Eulas Post, MD ZDG:[id number]  DOA: 10/17/2019  LOS: 2 days   Chief Complaint  Patient presents with  . Fall   Brief narrative: Betty Jackson is a 85 y.o. female with PMH significant for HTN, HLD, CAD, paroxysmal A. fib not on anticoagulation because of falls, CVA, chronic diastolic CHF, CKD, chronic anemia, chronic low back pain, rheumatoid arthritis, giant cell arteritis, diverticulosis, GERD, hemorrhoids, anxiety. She also had multiple recent hospitalizations for various problems including C. Difficile, diverticulitis, GI bleed, hand cellulitis. Patient was recently admitted earlier this month (6/28-7/2) after mechanical fall and distal R femur fx. She was supposed to be NWB 6-8 weeks on the R femur, was in SNF for rehab. On 7/20, last night, patient fell out of bed while trying to turn off her light.  She complained severe pain on the right femur/knee area and was hence brought to the ED.  Imaging in the ED noted right distal femur is fractured again at site of prior repair along with large joint effusion. Orthopedic surgeon Dr. Marcelino Scot was notified by EDP. Patient was admitted under hospitalist service for further evaluation and management  Subjective: Patient was seen and examined this morning in the ED.   Sitting up in bed.  Not in distress.  Family at bedside. Chart reviewed.  Blood pressure in 160s, Labs from this morning showed hemoglobin level down to 6.4.  1 unit of PRBC was transfused.    Assessment/Plan: Closed fx of R distal femur - for second time in 3 weeks -7/20, patient underwent ORIF. -Currently pain controlled on fentanyl patch and Norco as needed. -Lovenox for DVT prophylaxis.  AKI -Creatinine elevated 1.3 on admission.  Baseline less than 1. -With IV normal saline, creatinine is down to normal at 0.79.  IV fluid stopped.  Acute blood loss anemia  -Baseline hemoglobin more than 10.  Hemoglobin down to  6.4 today -1 unit of PRBC given.  Repeat hemoglobin tomorrow.  Cardiovascular issues: HTN, HLD, CAD, pAfib, CVA, chronic diastolic CHF -Home meds include Cardizem 240 mg daily, Lasix 40 mg daily with potassium supplement, metoprolol 50 mg twice daily.  Not on anticoagulation reportedly because of falls.  I do not see statin in the list. -Continue Cardizem and metoprolol.  Lasix remains on hold.  Blood pressure trending up.  Resume Lasix from this afternoon.  GCA/RA -On chronic prednisone 10mg  currently for a slow dose taper -Continue prednisone 10 mg daily. -Start Protonix while on steroids and Lovenox.  Anxiety/depression -Cont lexapro -Cont QHS PRN xanax -Daughter requested she be taken off of the welbutrin which was just started at SNF  Mobility: Needs PT eval postprocedure Code Status:   Code Status: DNR  Nutritional status: Body mass index is 25.75 kg/m. Nutrition Problem: Increased nutrient needs Etiology: post-op healing Signs/Symptoms: estimated needs Diet Order            Diet regular Room service appropriate? Yes; Fluid consistency: Thin  Diet effective now                 DVT prophylaxis: enoxaparin (LOVENOX) injection 30 mg Start: 10/18/19 0800 SCDs Start: 10/17/19 2038 SCDs Start: 10/17/19 0304   Antimicrobials:  None Fluid: None  Consultants: Orthopedics Family Communication:  Patient's son was at bedside.  Status is: Inpatient  Remains inpatient appropriate because:Ongoing active pain requiring inpatient pain management and IV treatments appropriate due to intensity of illness or inability to take PO  Dispo: The patient is from: SNF              Anticipated d/c is to: SNF              Anticipated d/c date is: 2 days              Patient currently is not medically stable to d/c.  Infusions:    Scheduled Meds: . cholecalciferol  2,000 Units Oral BID  . diltiazem  240 mg Oral Daily  . docusate sodium  100 mg Oral BID  . enoxaparin (LOVENOX)  injection  30 mg Subcutaneous Q24H  . escitalopram  10 mg Oral Daily  . famotidine  20 mg Oral Daily  . fentaNYL  1 patch Transdermal Q72H  . ferrous sulfate  325 mg Oral BID WC  . furosemide  40 mg Oral Daily  . mouth rinse  15 mL Mouth Rinse BID  . metoprolol tartrate  50 mg Oral BID  . multivitamin with minerals  1 tablet Oral Daily  . pantoprazole  40 mg Oral Daily  . potassium chloride  10 mEq Oral Daily  . predniSONE  10 mg Oral Q breakfast  . senna-docusate  1 tablet Oral QHS    Antimicrobials: Anti-infectives (From admission, onward)   Start     Dose/Rate Route Frequency Ordered Stop   10/17/19 2300  ceFAZolin (ANCEF) IVPB 2g/100 mL premix        2 g 200 mL/hr over 30 Minutes Intravenous Every 6 hours 10/17/19 2037 10/18/19 1038      PRN meds: HYDROcodone-acetaminophen, menthol-cetylpyridinium **OR** phenol, metoCLOPramide **OR** metoCLOPramide (REGLAN) injection, morphine injection, ondansetron **OR** ondansetron (ZOFRAN) IV   Objective: Vitals:   10/19/19 0741 10/19/19 0854  BP: (!) 148/60 (!) 157/69  Pulse: 67 67  Resp: 16 18  Temp: 98.1 F (36.7 C) 98.5 F (36.9 C)  SpO2: 96% 97%    Intake/Output Summary (Last 24 hours) at 10/19/2019 1238 Last data filed at 10/19/2019 1122 Gross per 24 hour  Intake 1537.73 ml  Output 2200 ml  Net -662.27 ml   Filed Weights   10/17/19 0101  Weight: 68 kg   Weight change:  Body mass index is 25.75 kg/m.   Physical Exam: General exam: Appears calm and comfortable.  Pain controlled. Skin: No rashes, lesions or ulcers. HEENT: Atraumatic, normocephalic, supple neck, no obvious bleeding Lungs: Clear to auscultation bilaterally CVS: Regular rate and rhythm, no murmur GI/Abd soft, nontender, nondistended, bowel sound present CNS: Alert, awake, oriented x3. Psychiatry: Cheerful. Extremities: No pedal edema, no calf tenderness.  Right leg has ACE wrap on.  Data Review: I have personally reviewed the laboratory data and  studies available.  Recent Labs  Lab 10/17/19 0152 10/17/19 0212 10/18/19 0310 10/19/19 0210  WBC 11.3*  --  10.7* 12.1*  NEUTROABS 8.0*  --  9.6*  --   HGB 11.0* 11.9* 8.1* 6.4*  HCT 35.6* 35.0* 26.1* 21.0*  MCV 94.4  --  95.6 95.9  PLT 239  --  174 150   Recent Labs  Lab 10/17/19 0212 10/17/19 2159 10/18/19 0310  NA 139  --  140  K 4.3  --  4.5  CL 99  --  104  CO2  --   --  28  GLUCOSE 96  --  157*  BUN 39*  --  13  CREATININE 1.30* 0.84 0.79  CALCIUM  --   --  8.3*   Signed, Betty Croak, MD [facility] Pager:  [phone] (Secure Chat preferred). 10/19/2019

## 2019-10-19 NOTE — Progress Notes (Signed)
Orthopaedic Trauma Service Progress Note  Patient ID: Betty Jackson MRN: [id number] DOB/AGE: 10-04-1934 85 y.o.  Subjective:  Doing ok this am  No specific complaints  Does not want to go back to previous snf   Eating breakfast   Received some blood this am   ROS As above  Objective:   VITALS:   Vitals:   10/19/19 0605 10/19/19 0620 10/19/19 0741 10/19/19 0854  BP: (!) 147/58 (!) 149/49 (!) 148/60 (!) 157/69  Pulse: 68 69 67 67  Resp: 18 16 16 18   Temp: 98.8 F (37.1 C) 98.8 F (37.1 C) 98.1 F (36.7 C) 98.5 F (36.9 C)  TempSrc: Oral Oral Oral Oral  SpO2: 95% 93% 96% 97%  Weight:      Height:        Estimated body mass index is 25.75 kg/m as calculated from the following:   Height as of this encounter: 5\' 4"  (1.626 m).   Weight as of this encounter: 68 kg.   Intake/Output      07/21 0701 - 07/22 0700 07/22 0701 - 07/23 0700   P.O. 960 240   I.V. (mL/kg) 250 (3.7)    Blood  327.7   IV Piggyback     Total Intake(mL/kg) 1210 (17.8) 567.7 (8.3)   Urine (mL/kg/hr) 1600 (1) 600 (1.8)   Stool 0 0   Blood     Total Output 1600 600   Net -390 -32.3        Urine Occurrence 1 x    Stool Occurrence 1 x 1 x     LABS  Results for orders placed or performed during the hospital encounter of 10/17/19 (from the past 24 hour(s))  CBC     Status: Abnormal   Collection Time: 10/19/19  2:10 AM  Result Value Ref Range   WBC 12.1 (H) 4.0 - 10.5 K/uL   RBC 2.19 (L) 3.87 - 5.11 MIL/uL   Hemoglobin 6.4 (LL) 12.0 - 15.0 g/dL   HCT 21.0 (L) 36 - 46 %   MCV 95.9 80.0 - 100.0 fL   MCH 29.2 26.0 - 34.0 pg   MCHC 30.5 30.0 - 36.0 g/dL   RDW 15.2 11.5 - 15.5 %   Platelets 150 150 - 400 K/uL   nRBC 0.0 0.0 - 0.2 %  Prepare RBC (crossmatch)     Status: None   Collection Time: 10/19/19  6:00 AM  Result Value Ref Range   Order Confirmation      ORDER PROCESSED BY BLOOD BANK Performed at [facility], [address] [place], [place] [address]      PHYSICAL EXAM:   Gen: resting comfortably in bed, NAD, eating breakfast now that transfusion has been completed  Lungs: unlabored Ext:       Right Lower Extremity              Dressing c/d/i             Ext warm              + DP pulse             Swelling controlled             DPN, SPN, TN sensation intact  EHL, FHL, lesser toe motor intact             Ankle flexion, extension, inversion and eversion intact             No DCT             Compartments soft     Assessment/Plan: 2 Days Post-Op   Principal Problem:   Closed fracture of right distal femur (HCC) Active Problems:   Major depressive disorder, recurrent episode, moderate (HCC)   PAF (paroxysmal atrial fibrillation) (HCC)   HTN (hypertension)   Giant cell arteritis (HCC)   Chronic diastolic CHF (congestive heart failure) ([place])   Pressure injury of skin   Anti-infectives (From admission, onward)   Start     Dose/Rate Route Frequency Ordered Stop   10/17/19 2300  ceFAZolin (ANCEF) IVPB 2g/100 mL premix        2 g 200 mL/hr over 30 Minutes Intravenous Every 6 hours 10/17/19 2037 10/18/19 1038    .  POD/HD#: 2  85 y/o female s/p R femur peri-implant distal femur fracture   -R distal femur fracture peri-implant s/p ROH and ORIF              NWB R leg x 4-6 weeks             Unrestricted ROM R knee             Hx of R THA              PT/OT              Dressing change tomorrow              Ice and elevate               Do not let knee rest in flexion                          Bone foam or pillows under ankle     - Pain management:             Duragesic patch chronically             norco    - ABL anemia/Hemodynamics             s/p 1 unit PRBC  CBC in am    - Medical issues              Per primary    - DVT/PE prophylaxis:             lovenox x 4 weeks  - ID:              periop abx completed    - Metabolic Bone  Disease:             Repeat vitamin d labs pending              Continue supplements             Fracture liaison consult placed during last admission    - Activity:             NWB R leg    - Impediments to fracture healing:             Frequent falls             Chronic opioids             Vitamin d insufficiency  CKD   - Dispo:             Therapies             SNF when bed available and medically stable    ? Tomorrow   Ortho issues stable     Jari Pigg, PA-C [phone] (C) 10/19/2019, 11:52 AM  [facility] [place] [phone] Domingo Sep (F)

## 2019-10-20 DIAGNOSIS — N179 Acute kidney failure, unspecified: Secondary | ICD-10-CM

## 2019-10-20 LAB — BPAM RBC
Blood Product Expiration Date: 202108202359
Blood Product Expiration Date: 202108222359
ISSUE DATE / TIME: 202107220556
ISSUE DATE / TIME: 202107221714
Unit Type and Rh: 5100
Unit Type and Rh: 5100

## 2019-10-20 LAB — CBC
HCT: 32.5 % — ABNORMAL LOW (ref 36.0–46.0)
Hemoglobin: 10.5 g/dL — ABNORMAL LOW (ref 12.0–15.0)
MCH: 29.2 pg (ref 26.0–34.0)
MCHC: 32.3 g/dL (ref 30.0–36.0)
MCV: 90.5 fL (ref 80.0–100.0)
Platelets: 194 10*3/uL (ref 150–400)
RBC: 3.59 MIL/uL — ABNORMAL LOW (ref 3.87–5.11)
RDW: 15.5 % (ref 11.5–15.5)
WBC: 12.4 10*3/uL — ABNORMAL HIGH (ref 4.0–10.5)
nRBC: 0.2 % (ref 0.0–0.2)

## 2019-10-20 LAB — TYPE AND SCREEN
ABO/RH(D): O POS
Antibody Screen: NEGATIVE
Unit division: 0
Unit division: 0

## 2019-10-20 MED ORDER — ENOXAPARIN SODIUM 30 MG/0.3ML ~~LOC~~ SOLN: 30.0000 mg | SUBCUTANEOUS | 0 refills | Status: DC

## 2019-10-20 MED ORDER — ADULT MULTIVITAMIN W/MINERALS CH: 1.0000 | ORAL_TABLET | Freq: Every day | ORAL | 0 refills | Status: DC

## 2019-10-20 NOTE — Assessment & Plan Note (Signed)
-   continue routine turing and working with PT to get OOB

## 2019-10-20 NOTE — Assessment & Plan Note (Signed)
Continue Cardizem. 

## 2019-10-20 NOTE — Progress Notes (Signed)
Physical Therapy Treatment Patient Details Name: Betty Jackson MRN: [id number] DOB: 22-Feb-1935 Today's Date: 10/20/2019    History of Present Illness Betty Jackson is a 85 y.o. female with PMH significant for HTN, HLD, CAD, paroxysmal A. fib,s, CVA, chronic diastolic CHF, CKD, chronic anemia, chronic low back pain, rheumatoid arthritis, giant cell arteritis, diverticulosis, GERD, hemorrhoids, anxiety. Patient was recently admitted earlier this month (6/28-7/2) after mechanical fall and distal R femur fx. Pt with recurrent fall 7/20 resulting in closed right distal femur fracture now s/p ORIF.     PT Comments    Pt progressing towards physical therapy goals, exhibiting improved pain control and activity tolerance. Session focused on therapeutic exercises for ROM/strengthening and transfer training. Pt requiring two person moderate assist for standing from edge of bed and low pivot transfers. Has difficulty maintaining weightbearing precautions. Continue to recommend SNF for ongoing Physical Therapy.      Follow Up Recommendations  SNF     Equipment Recommendations  None recommended by PT    Recommendations for Other Services       Precautions / Restrictions Precautions Precautions: Fall Restrictions Weight Bearing Restrictions: Yes RLE Weight Bearing: Non weight bearing Other Position/Activity Restrictions: Unrestricted R knee ROM    Mobility  Bed Mobility Overal bed mobility: Needs Assistance Bed Mobility: Supine to Sit     Supine to sit: Min assist     General bed mobility comments: MinA for LLE negotiation to edge of bed, increased time and effort for reciprocal scooting  Transfers Overall transfer level: Needs assistance Equipment used: None Transfers: Set designer Transfers;Sit to/from Stand Sit to Stand: Mod assist;+2 physical assistance   Squat pivot transfers: Mod assist;+2 physical assistance     General transfer comment: Pt standing from edge of bed x 2 with  modA + 2, PT foot underneath R foot to prevent weightbearing. Pt with difficulty achieving hip extension and unable to pivot on LLE. Performed squat pivot to left to chair  Ambulation/Gait                 Stairs             Wheelchair Mobility    Modified Rankin (Stroke Patients Only)       Balance Overall balance assessment: Needs assistance Sitting-balance support: Feet supported Sitting balance-[name]: Fair     Standing balance support: Bilateral upper extremity supported;During functional activity Standing balance-[name]: Poor                              Cognition Arousal/Alertness: Awake/alert Behavior During Therapy: WFL for tasks assessed/performed Overall Cognitive Status: Impaired/Different from baseline Area of Impairment: Problem solving;Memory;Orientation                 Orientation Level: Situation   Memory: Decreased short-term memory       Problem Solving: Slow processing        Exercises General Exercises - Lower Extremity Quad Sets: Right;10 reps;Supine Heel Slides: Supine;Both;10 reps Hip ABduction/ADduction: AAROM;Right;10 reps;Supine    General Comments        Pertinent Vitals/Pain Pain Assessment: Faces Faces Pain Scale: Hurts little more Pain Location: RLE with movement Pain Descriptors / Indicators: Aching;Operative site guarding Pain Intervention(s): Monitored during session    Home Living                      Prior Function  PT Goals (current goals can now be found in the care plan section) Acute Rehab PT Goals Patient Stated Goal: less pain PT Goal Formulation: With patient Time For Goal Achievement: 11/01/19 Potential to Achieve Goals: Fair Progress towards PT goals: Progressing toward goals    Frequency    Min 3X/week      PT Plan Current plan remains appropriate    Co-evaluation              AM-PAC PT "6 Clicks" Mobility   Outcome  Measure  Help needed turning from your back to your side while in a flat bed without using bedrails?: A Little Help needed moving from lying on your back to sitting on the side of a flat bed without using bedrails?: A Lot Help needed moving to and from a bed to a chair (including a wheelchair)?: Total Help needed standing up from a chair using your arms (e.g., wheelchair or bedside chair)?: Total Help needed to walk in hospital room?: Total Help needed climbing 3-5 steps with a railing? : Total 6 Click Score: 9    End of Session Equipment Utilized During Treatment: Gait belt Activity Tolerance: Patient tolerated treatment well Patient left: with call bell/phone within reach;in chair;Other (comment) (with chair alarm belt) Nurse Communication: Mobility status;Patient requests pain meds PT Visit Diagnosis: Other abnormalities of gait and mobility (R26.89);Pain Pain - Right/Left: Left Pain - part of body: Leg     Time: 1400-1430 PT Time Calculation (min) (ACUTE ONLY): 30 min  Charges:  $Therapeutic Exercise: 8-22 mins $Therapeutic Activity: 8-22 mins                       Wyona Almas, PT, DPT Acute Rehabilitation Services Pager [phone] Office [phone]    Deno Etienne 10/20/2019, 3:34 PM

## 2019-10-20 NOTE — Plan of Care (Signed)
  Problem: Safety: Goal: Ability to remain free from injury will improve Outcome: Progressing   

## 2019-10-20 NOTE — Assessment & Plan Note (Signed)
Continue Lexapro

## 2019-10-20 NOTE — Assessment & Plan Note (Signed)
On chronic prednisone 10mg currently for a slow dose taper -Continue prednisone 10 mg daily. -Start Protonix while on steroids and Lovenox.

## 2019-10-20 NOTE — NC FL2 (Signed)
[place] MEDICAID FL2 LEVEL OF CARE SCREENING TOOL     IDENTIFICATION  Patient Name: Betty Jackson Birthdate: Oct 30, 1934 Sex: female Admission Date (Current Location): 10/17/2019  [facility] and [place] Number:  Herbalist and Address:  [facility] [place], [place] [address] [place], [place] [address]      Provider Number: [id number]  Attending Physician Name and Address:  Dwyane Dee, MD  Relative Name and Phone Number:  [name] - [phone]    Current Level of Care: Hospital Recommended Level of Care: [place] Prior Approval Number:    Date Approved/Denied:   PASRR Number: [id number]  Discharge Plan: SNF    Current Diagnoses: Patient Active Problem List   Diagnosis Date Noted  . Pressure injury of skin 10/18/2019  . Closed fracture of right distal femur ([place]) 10/17/2019  . Femur fracture, right ([place]) 09/25/2019  . Chronic diastolic CHF (congestive heart failure) ([place]) 09/25/2019  . HCAP (healthcare-associated pneumonia) 06/19/2019  . Encephalopathy 06/19/2019  . Lumbar degenerative disc disease   . Paroxysmal A-fib ([place])   . Hypomagnesemia   . C. difficile colitis   . Diarrhea 05/29/2019  . CHF (congestive heart failure) ([place]) 05/29/2019  . Recurrent falls 04/30/2019  . Pericardial effusion without cardiac tamponade 03/29/2019  . Hypoalbuminemia 03/27/2019  . Cardiac tamponade   . Pericarditis 03/06/2019  . Fever   . Septic shock ([place])   . Atypical chest pain   . Atrial fibrillation with rapid ventricular response ([place])   . Nausea 01/17/2019  . Acute diverticulitis 01/13/2019  . AKI (acute kidney injury) ([place]) 01/13/2019  . Chronic back pain 01/13/2019  . Tachycardia 01/13/2019  . Hypokalemia 01/13/2019  . Chronic low back pain 07/10/2018  . Influenza A 05/20/2017  . Sepsis ([place]) 05/20/2017  . Acute respiratory failure with hypoxia ([place]) 05/20/2017  . CKD (chronic kidney disease), stage III  05/20/2017  . PAF (paroxysmal atrial fibrillation) ([place]) 05/20/2017  . HTN (hypertension) 05/20/2017  . Giant cell arteritis ([place]) 05/20/2017  . Coronary disease 05/20/2017  . Abnormal urinalysis 05/20/2017  . Osteoporosis 05/08/2016  . Herniated nucleus pulposus, L5-S1, right 11/04/2015  . Major depressive disorder, recurrent episode, moderate ([place]) 07/16/2013  . Abdominal pain, epigastric 06/22/2013  . Lumbar stenosis 04/25/2013  . Chronic insomnia 02/07/2013  . Elevated transaminase level 02/07/2013  . CVA (cerebral infarction) 10/29/2012  . Visual changes 10/28/2012  . Depression 10/28/2012  . Anxiety state 10/28/2012  . Anemia, unspecified 10/28/2012  . Nonspecific (abnormal) findings on radiological and other examination of gastrointestinal tract 01/25/2012  . Hyponatremia 01/24/2012  . Hematuria 01/24/2012  . Enteritis 12/24/2011  . Abdominal pain, other specified site 12/24/2011  . Leg pain, right 02/18/2011  . Temporal arteritis ([place]) 09/01/2010  . Hypertension 09/01/2010  . Hyperlipidemia 09/01/2010  . History of atrial fibrillation 09/01/2010  . CAD (coronary artery disease) 09/01/2010  . GERD (gastroesophageal reflux disease) 09/01/2010  . Insomnia 09/01/2010    Orientation RESPIRATION BLADDER Height & Weight     Self, Time, Situation  Normal Incontinent Weight: 150 lb (68 kg) Height:  5\' 4"  (162.6 cm)  BEHAVIORAL SYMPTOMS/MOOD NEUROLOGICAL BOWEL NUTRITION STATUS      Incontinent Diet (See discharge summary)  AMBULATORY STATUS COMMUNICATION OF NEEDS Skin   Extensive Assist Verbally Surgical wounds                       Personal Care Assistance Level of Assistance  Dressing, Bathing Bathing Assistance:  Maximum assistance Feeding assistance: Independent Dressing Assistance: Maximum assistance     Functional Limitations Info  Sight, Hearing, Speech Sight Info: Adequate Hearing Info: Adequate Speech Info: Adequate    SPECIAL CARE FACTORS FREQUENCY   PT (By licensed PT), OT (By licensed OT)     PT Frequency: 5x a week OT Frequency: 5x a week            Contractures Contractures Info: Not present    Additional Factors Info  Code Status, Allergies, Psychotropic Code Status Info: DNR Allergies Info: Dextromethorphan Psychotropic Info: xanax         Current Medications (10/20/2019):  This is the current hospital active medication list Current Facility-Administered Medications  Medication Dose Route Frequency Provider Last Rate Last Admin  . 0.9 %  sodium chloride infusion (Manually program via Guardrails IV Fluids)   Intravenous Once Terrilee Croak, MD   Held at 10/19/19 1726  . cholecalciferol (VITAMIN D3) tablet 2,000 Units  2,000 Units Oral BID Terrilee Croak, MD   2,000 Units at 10/20/19 0908  . diltiazem (CARDIZEM CD) 24 hr capsule 240 mg  240 mg Oral Daily Ainsley Spinner, PA-C   240 mg at 10/20/19 6063  . docusate sodium (COLACE) capsule 100 mg  100 mg Oral BID Ainsley Spinner, PA-C   100 mg at 10/20/19 0160  . enoxaparin (LOVENOX) injection 30 mg  30 mg Subcutaneous Q24H Ainsley Spinner, PA-C   30 mg at 10/20/19 0908  . escitalopram (LEXAPRO) tablet 10 mg  10 mg Oral Daily Ainsley Spinner, PA-C   10 mg at 10/20/19 1093  . famotidine (PEPCID) tablet 20 mg  20 mg Oral Daily Ainsley Spinner, PA-C   20 mg at 10/20/19 2355  . fentaNYL (DURAGESIC) 12 MCG/HR 1 patch  1 patch Transdermal Q72H Ainsley Spinner, PA-C   1 patch at 10/18/19 1032  . ferrous sulfate tablet 325 mg  325 mg Oral BID WC Ainsley Spinner, PA-C   325 mg at 10/20/19 0908  . furosemide (LASIX) tablet 40 mg  40 mg Oral Daily Dahal, Marlowe Aschoff, MD   40 mg at 10/20/19 0909  . HYDROcodone-acetaminophen (NORCO/VICODIN) 5-325 MG per tablet 1-2 tablet  1-2 tablet Oral Q6H PRN Ainsley Spinner, PA-C   1 tablet at 10/20/19 7322  . MEDLINE mouth rinse  15 mL Mouth Rinse BID [place], MD   15 mL at 10/19/19 2259  . menthol-cetylpyridinium (CEPACOL) lozenge 3 mg  1 lozenge Oral PRN Ainsley Spinner, PA-C        Or  . phenol (CHLORASEPTIC) mouth spray 1 spray  1 spray Mouth/Throat PRN Ainsley Spinner, PA-C      . metoCLOPramide (REGLAN) tablet 5-10 mg  5-10 mg Oral Q8H PRN Ainsley Spinner, PA-C       Or  . metoCLOPramide (REGLAN) injection 5-10 mg  5-10 mg Intravenous Q8H PRN Ainsley Spinner, PA-C      . metoprolol tartrate (LOPRESSOR) tablet 50 mg  50 mg Oral BID Ainsley Spinner, PA-C   50 mg at 10/20/19 0254  . morphine 2 MG/ML injection 0.5 mg  0.5 mg Intravenous Q2H PRN Ainsley Spinner, PA-C   0.5 mg at 10/19/19 0234  . multivitamin with minerals tablet 1 tablet  1 tablet Oral Daily Dahal, Marlowe Aschoff, MD   1 tablet at 10/20/19 0909  . ondansetron (ZOFRAN) tablet 4 mg  4 mg Oral Q6H PRN Ainsley Spinner, PA-C       Or  . ondansetron [facility]) injection 4 mg  4 mg Intravenous Q6H  PRN Ainsley Spinner, PA-C      . pantoprazole (PROTONIX) EC tablet 40 mg  40 mg Oral Daily Dahal, Marlowe Aschoff, MD   40 mg at 10/20/19 0908  . potassium chloride (KLOR-CON) CR tablet 10 mEq  10 mEq Oral Daily Ainsley Spinner, PA-C   10 mEq at 10/20/19 6725  . predniSONE (DELTASONE) tablet 10 mg  10 mg Oral Q breakfast Ainsley Spinner, PA-C   10 mg at 10/20/19 5001  . senna-docusate (Senokot-S) tablet 1 tablet  1 tablet Oral QHS Ainsley Spinner, PA-C   1 tablet at 10/19/19 2258     Discharge Medications: Please see discharge summary for a list of discharge medications.  Relevant Imaging Results:  Relevant Lab Results:   Additional Information [place], [place]

## 2019-10-20 NOTE — Progress Notes (Signed)
Orthopaedic Trauma Service Progress Note  Patient ID: Betty Jackson MRN: [id number] DOB/AGE: 08/18/1934 85 y.o.  Subjective:  Doing better Received 2nd unit of PRBCs yesterday   No issues this am  Pain tolerable    ROS As above  Objective:   VITALS:   Vitals:   10/19/19 1735 10/19/19 1935 10/19/19 2107 10/20/19 0749  BP: (!) 143/55 (!) 151/67 (!) 154/66 (!) 181/76  Pulse: 72 74 67 72  Resp: 17 16  17   Temp: 98.7 F (37.1 C) 97.9 F (36.6 C) 99 F (37.2 C) 98.7 F (37.1 C)  TempSrc: Oral Oral Oral Oral  SpO2: 98% 98%  97%  Weight:      Height:        Estimated body mass index is 25.75 kg/m as calculated from the following:   Height as of this encounter: 5\' 4"  (1.626 m).   Weight as of this encounter: 68 kg.   Intake/Output      07/22 0701 - 07/23 0700 07/23 0701 - 07/24 0700   P.O. 600    I.V. (mL/kg) 0 (0)    Blood 327.7    Total Intake(mL/kg) 927.7 (13.6)    Urine (mL/kg/hr) 1300 (0.8)    Stool 0    Total Output 1300    Net -372.3         Stool Occurrence 1 x      LABS  Results for orders placed or performed during the hospital encounter of 10/17/19 (from the past 24 hour(s))  Hemoglobin and hematocrit, blood     Status: Abnormal   Collection Time: 10/19/19 12:04 PM  Result Value Ref Range   Hemoglobin 6.5 (LL) 12.0 - 15.0 g/dL   HCT 21.1 (L) 36 - 46 %  Prepare RBC (crossmatch)     Status: None   Collection Time: 10/19/19  3:31 PM  Result Value Ref Range   Order Confirmation      ORDER PROCESSED BY BLOOD BANK Performed at [facility], [address] [place], [place] [address]   CBC     Status: Abnormal   Collection Time: 10/20/19  1:56 AM  Result Value Ref Range   WBC 12.4 (H) 4.0 - 10.5 K/uL   RBC 3.59 (L) 3.87 - 5.11 MIL/uL   Hemoglobin 10.5 (L) 12.0 - 15.0 g/dL   HCT 32.5 (L) 36 - 46 %   MCV 90.5 80.0 - 100.0 fL   MCH 29.2 26.0 - 34.0 pg   MCHC 32.3  30.0 - 36.0 g/dL   RDW 15.5 11.5 - 15.5 %   Platelets 194 150 - 400 K/uL   nRBC 0.2 0.0 - 0.2 %     PHYSICAL EXAM:   Gen: resting comfortably in bed, NAD, watching TV Lungs: unlabored Ext:       Right Lower Extremity              Dressing c/d/i   Dressings removed   All wounds look great   No active drainage   No signs of infection              Ext warm              + DP pulse             Swelling controlled  DPN, SPN, TN sensation intact             EHL, FHL, lesser toe motor intact             Ankle flexion, extension, inversion and eversion intact             No DCT             Compartments soft   Assessment/Plan: 3 Days Post-Op   Principal Problem:   Closed fracture of right distal femur (HCC) Active Problems:   Major depressive disorder, recurrent episode, moderate (HCC)   PAF (paroxysmal atrial fibrillation) (HCC)   HTN (hypertension)   Giant cell arteritis (HCC)   Chronic diastolic CHF (congestive heart failure) ([place])   Pressure injury of skin   Anti-infectives (From admission, onward)   Start     Dose/Rate Route Frequency Ordered Stop   10/17/19 2300  ceFAZolin (ANCEF) IVPB 2g/100 mL premix        2 g 200 mL/hr over 30 Minutes Intravenous Every 6 hours 10/17/19 2037 10/18/19 1038    .  POD/HD#: 50  85 y/o female s/p R femur peri-implant distal femur fracture   -R distal femur fracture peri-implant s/p ROH and ORIF              NWB R leg x 4-6 weeks             Unrestricted ROM R knee             Hx of R THA              PT/OT              Dressing changed today    Change as needed    Ok to leave open to the air as well   Can shower and clean wounds with soap and water only              Ice and elevate               Do not let knee rest in flexion                          Bone foam or pillows under ankle     - Pain management:             Duragesic patch chronically             norco    - ABL anemia/Hemodynamics             s/p  2 unit PRBC             CBC appears stable    - Medical issues              Per primary    - DVT/PE prophylaxis:             lovenox x 4 weeks  - ID:              periop abx completed    - Metabolic Bone Disease:             Repeat vitamin d labs pending              Continue supplements             Fracture liaison consult placed during last admission    - Activity:  NWB R leg    - Impediments to fracture healing:             Frequent falls             Chronic opioids             Vitamin d insufficiency              CKD   - Dispo:             Therapies             SNF when bed available   Ortho issues stable  Follow up with ortho in 10 days    Jari Pigg, PA-C [phone] (C) 10/20/2019, 10:37 AM  [facility] [place] [phone] (F)

## 2019-10-20 NOTE — TOC Initial Note (Signed)
Transition of Care [facility]) - Initial/Assessment Note    Patient Details  Name: Betty Jackson MRN: [id number] Date of Birth: Sep 16, 1934  Transition of Care [facility]) CM/SW Contact:    Emeterio Reeve, [place] Phone Number: 10/20/2019, 3:45 PM  Clinical Narrative:                  CSW met with pt at bedside. CSW introduced self and explained her role at the hospital.  Pt stated that she was at [facility] and planned on going back. CSW called Levada Dy at [facility] and confirmed that she is able to return.   Expected Discharge Plan: Skilled Nursing Facility Barriers to Discharge: Continued Medical Work up   Patient Goals and CMS Choice Patient states their goals for this hospitalization and ongoing recovery are:: To return back home CMS Medicare.gov Compare Post Acute Care list provided to:: Patient Choice offered to / list presented to : Patient  Expected Discharge Plan and Services Expected Discharge Plan: [place] arrangements for the past 2 months: [place] Expected Discharge Date: 10/21/19                                    Prior Living Arrangements/Services Living arrangements for the past 2 months: [place] Lives with:: Facility Resident Patient language and need for interpreter reviewed:: Yes        Need for Family Participation in Patient Care: Yes (Comment) Care giver support system in place?: Yes (comment)   Criminal Activity/Legal Involvement Pertinent to Current Situation/Hospitalization: No - Comment as needed  Activities of Daily Living Home Assistive Devices/Equipment: Environmental consultant (specify type), Wheelchair ADL Screening (condition at time of admission) Patient's cognitive ability adequate to safely complete daily activities?: No Is the patient deaf or have difficulty hearing?: No Does the patient have difficulty seeing, even when wearing glasses/contacts?: No Does the patient have difficulty concentrating,  remembering, or making decisions?: Yes Patient able to express need for assistance with ADLs?: Yes Does the patient have difficulty dressing or bathing?: Yes Independently performs ADLs?: No Communication: Needs assistance Dressing (OT): Dependent Grooming: Dependent Feeding: Needs assistance Bathing: Dependent Toileting: Needs assistance In/Out Bed: Dependent Walks in Home: Needs assistance Does the patient have difficulty walking or climbing stairs?: Yes Weakness of Legs: Both Weakness of Arms/Hands: Both  Permission Sought/Granted Permission sought to share information with : Facility Sport and exercise psychologist, Family Supports Permission granted to share information with : Yes, Verbal Permission Granted  Share Information with NAME: Melissa  Permission granted to share info w AGENCY: SNF        Emotional Assessment Appearance:: Appears stated age Attitude/Demeanor/Rapport: Engaged Affect (typically observed): Appropriate Orientation: : Oriented to Self, Oriented to Place, Oriented to  Time, Oriented to Situation Alcohol / Substance Use: Not Applicable Psych Involvement: No (comment)  Admission diagnosis:  Closed fracture of right distal femur ([place]) [S72.401A] Pre-op evaluation [Z01.818] Fall, initial encounter [W19.XXXA] Closed fracture of right femur, unspecified fracture morphology, unspecified portion of femur, initial encounter [facility]) [S72.91XA] Patient Active Problem List   Diagnosis Date Noted  . Pressure injury of skin 10/18/2019  . Closed fracture of right distal femur ([place]) 10/17/2019  . Femur fracture, right ([place]) 09/25/2019  . Chronic diastolic CHF (congestive heart failure) ([place]) 09/25/2019  . HCAP (healthcare-associated pneumonia) 06/19/2019  . Encephalopathy 06/19/2019  . Lumbar degenerative disc disease   . Paroxysmal  A-fib ([place])   . Hypomagnesemia   . C. difficile colitis   . Diarrhea 05/29/2019  . CHF (congestive heart failure) ([place]) 05/29/2019  .  Recurrent falls 04/30/2019  . Pericardial effusion without cardiac tamponade 03/29/2019  . Hypoalbuminemia 03/27/2019  . Cardiac tamponade   . Pericarditis 03/06/2019  . Fever   . Septic shock ([place])   . Atypical chest pain   . Atrial fibrillation with rapid ventricular response ([place])   . Nausea 01/17/2019  . Acute diverticulitis 01/13/2019  . AKI (acute kidney injury) ([place]) 01/13/2019  . Chronic back pain 01/13/2019  . Tachycardia 01/13/2019  . Hypokalemia 01/13/2019  . Chronic low back pain 07/10/2018  . Influenza A 05/20/2017  . Sepsis ([place]) 05/20/2017  . Acute respiratory failure with hypoxia ([place]) 05/20/2017  . CKD (chronic kidney disease), stage III 05/20/2017  . PAF (paroxysmal atrial fibrillation) ([place]) 05/20/2017  . HTN (hypertension) 05/20/2017  . Giant cell arteritis ([place]) 05/20/2017  . Coronary disease 05/20/2017  . Abnormal urinalysis 05/20/2017  . Osteoporosis 05/08/2016  . Herniated nucleus pulposus, L5-S1, right 11/04/2015  . Major depressive disorder, recurrent episode, moderate ([place]) 07/16/2013  . Abdominal pain, epigastric 06/22/2013  . Lumbar stenosis 04/25/2013  . Chronic insomnia 02/07/2013  . Elevated transaminase level 02/07/2013  . CVA (cerebral infarction) 10/29/2012  . Visual changes 10/28/2012  . Depression 10/28/2012  . Anxiety state 10/28/2012  . Anemia, unspecified 10/28/2012  . Nonspecific (abnormal) findings on radiological and other examination of gastrointestinal tract 01/25/2012  . Hyponatremia 01/24/2012  . Hematuria 01/24/2012  . Enteritis 12/24/2011  . Abdominal pain, other specified site 12/24/2011  . Leg pain, right 02/18/2011  . Temporal arteritis ([place]) 09/01/2010  . Hypertension 09/01/2010  . Hyperlipidemia 09/01/2010  . History of atrial fibrillation 09/01/2010  . CAD (coronary artery disease) 09/01/2010  . GERD (gastroesophageal reflux disease) 09/01/2010  . Insomnia 09/01/2010   PCP:  Eulas Post, MD Pharmacy:    [id number] [place] [place]: [phone]: [phone]    Social Determinants of Health (SDOH) Interventions    Readmission Risk Interventions Readmission Risk Prevention Plan 09/26/2019 06/21/2019 06/01/2019  Transportation Screening Complete Complete Complete  Medication Review (Press photographer Complete Complete Complete  PCP or Specialist appointment within 3-5 days of discharge Complete Not Complete Complete  PCP/Specialist Appt Not Complete comments - likely SNF placement -  [place] Care Consult Complete Not Complete Complete  [place] Home Care Consult Pt Refusal Comments - likely SNF -  SW Recovery Care/Counseling Consult Complete Complete Complete  Palliative Care Screening Complete Complete -  [place] Complete Not Applicable  Some recent data might be hidden   MEmeterio Reeve LLatanya Presser LAvonSocial Worker [phone]

## 2019-10-20 NOTE — Assessment & Plan Note (Signed)
Creatinine elevated 1.3 on admission.  Baseline less than 1. -Normalized with IV fluids

## 2019-10-20 NOTE — Hospital Course (Addendum)
SHILPA BUSHEE is a 85 y.o. female with PMH significant for HTN, HLD, CAD, paroxysmal A. fib not on anticoagulation because of falls, CVA, chronic diastolic CHF, CKD, chronic anemia, chronic low back pain, rheumatoid arthritis, giant cell arteritis, diverticulosis, GERD, hemorrhoids, anxiety. She also had multiple recent hospitalizations for various problems including C. Difficile, diverticulitis, GI bleed, hand cellulitis.  Patient was recently admitted earlier this month (6/28-7/2) after mechanical fall and distal R femur fx. She was supposed to be NWB 6-8 weeks on the R femur, was in SNF for rehab. On 7/20, patient fell out of bed while trying to turn off her light.  She complained severe pain on the right femur/knee area and was hence brought to the ED.   Imaging in the ED noted right distal femur is fractured again at site of prior repair along with large joint effusion. Orthopedic surgeon Dr. Marcelino Scot was notified by EDP. She underwent repeat ORIF on 10/17/19.  Her Hgb showed signs of downtrending and on 7/22 had down trended to 6.5 g/dL.  She was given a total of 2 units PRBC with good response.  The following morning hemoglobin had improved to 10.5 g/dL on 10/20/2019. With further monitoring, and remained stable.  She was to be discharged back to SNF however denied by insurance. After peer to peer on 7/26, the recommendation was for LT care until she was able to be more weight bearing (currently NWB 4-6 weeks on RLE).  After family discussion, patient and family have elected to return to her prior SNF.

## 2019-10-20 NOTE — TOC CAGE-AID Note (Signed)
Transition of Care [facility]) - CAGE-AID Screening   Patient Details  Name: Betty Jackson MRN: [id number] Date of Birth: July 09, 1934  [facility] CM/SW Contact:    Emeterio Reeve, [place] Phone Number: 10/20/2019, 3:16 PM   Clinical Narrative:  CSW met with pt at bedside. CSW introduced self and explained her role at the hospital.  Pt denied alcohol use and substance use.   CAGE-AID Screening:    Have You Ever Felt You Ought to Cut Down on Your Drinking or Drug Use?: No Have People Annoyed You By Critizing Your Drinking Or Drug Use?: No Have You Felt Bad Or Guilty About Your Drinking Or Drug Use?: No Have You Ever Had a Drink or Used Drugs First Thing In The Morning to Steady Your Nerves or to Get Rid of a Hangover?: No CAGE-AID Score: 0  Substance Abuse Education Offered: Yes    Blima Ledger, [place] Social Worker [phone]

## 2019-10-20 NOTE — Assessment & Plan Note (Addendum)
-  Continue Lovenox and Cardizem -Was taken off of Eliquis by cardiology due to her increasing fall risk

## 2019-10-20 NOTE — Discharge Instructions (Signed)
Orthopaedic Trauma Service Discharge Instructions   General Discharge Instructions  Orthopaedic Injuries:  Right distal femur fracture treated with open reduction and internal fixation using plate and screws   WEIGHT BEARING STATUS: Nonweightbearing right leg, up with assistance  RANGE OF MOTION/ACTIVITY: unrestricted range of motion right knee. Pt with hx of R total hip arthroplasty   Bone health: continue with vitamin d supplementation. Will need DEXA in the outpatient setting    Wound Care: daily wound care as needed starting 10/22/2019. See below   Discharge Wound Care Instructions  Do NOT apply any ointments, solutions or lotions to pin sites or surgical wounds.  These prevent needed drainage and even though solutions like hydrogen peroxide kill bacteria, they also damage cells lining the pin sites that help fight infection.  Applying lotions or ointments can keep the wounds moist and can cause them to breakdown and open up as well. This can increase the risk for infection. When in doubt call the office.  Surgical incisions should be dressed daily.  If any drainage is noted, use one layer of adaptic, then gauze, Kerlix, and an ace wrap.  Once the incision is completely dry and without drainage, it may be left open to air out.  Showering may begin 36-48 hours later.  Cleaning gently with soap and water.   DVT/PE prophylaxis: lovenox injection daily x 4 weeks   Diet: as you were eating previously.  Can use over the counter stool softeners and bowel preparations, such as Miralax, to help with bowel movements.  Narcotics can be constipating.  Be sure to drink plenty of fluids  PAIN MEDICATION USE AND EXPECTATIONS  You have likely been given narcotic medications to help control your pain.  After a traumatic event that results in an fracture (broken bone) with or without surgery, it is ok to use narcotic pain medications to help control one's pain.  We understand that everyone  responds to pain differently and each individual patient will be evaluated on a regular basis for the continued need for narcotic medications. Ideally, narcotic medication use should last no more than 6-8 weeks (coinciding with fracture healing).   As a patient it is your responsibility as well to monitor narcotic medication use and report the amount and frequency you use these medications when you come to your office visit.   We would also advise that if you are using narcotic medications, you should take a dose prior to therapy to maximize you participation.  IF YOU ARE ON NARCOTIC MEDICATIONS IT IS NOT PERMISSIBLE TO OPERATE A MOTOR VEHICLE (MOTORCYCLE/CAR/TRUCK/MOPED) OR HEAVY MACHINERY DO NOT MIX NARCOTICS WITH OTHER CNS (CENTRAL NERVOUS SYSTEM) DEPRESSANTS SUCH AS ALCOHOL   STOP SMOKING OR USING NICOTINE PRODUCTS!!!!  As discussed nicotine severely impairs your body's ability to heal surgical and traumatic wounds but also impairs bone healing.  Wounds and bone heal by forming microscopic blood vessels (angiogenesis) and nicotine is a vasoconstrictor (essentially, shrinks blood vessels).  Therefore, if vasoconstriction occurs to these microscopic blood vessels they essentially disappear and are unable to deliver necessary nutrients to the healing tissue.  This is one modifiable factor that you can do to dramatically increase your chances of healing your injury.    (This means no smoking, no nicotine gum, patches, etc)  DO NOT USE NONSTEROIDAL ANTI-INFLAMMATORY DRUGS (NSAID'S)  Using products such as Advil (ibuprofen), Aleve (naproxen), Motrin (ibuprofen) for additional pain control during fracture healing can delay and/or prevent the healing response.  If you would  like to take over the counter (OTC) medication, Tylenol (acetaminophen) is ok.  However, some narcotic medications that are given for pain control contain acetaminophen as well. Therefore, you should not exceed more than 4000 mg of  tylenol in a day if you do not have liver disease.  Also note that there are may OTC medicines, such as cold medicines and allergy medicines that my contain tylenol as well.  If you have any questions about medications and/or interactions please ask your doctor/PA or your pharmacist.      ICE AND ELEVATE INJURED/OPERATIVE EXTREMITY  Using ice and elevating the injured extremity above your heart can help with swelling and pain control.  Icing in a pulsatile fashion, such as 20 minutes on and 20 minutes off, can be followed.    Do not place ice directly on skin. Make sure there is a barrier between to skin and the ice pack.    Using frozen items such as frozen peas works well as the conform nicely to the are that needs to be iced.  USE AN ACE WRAP OR TED HOSE FOR SWELLING CONTROL  In addition to icing and elevation, Ace wraps or TED hose are used to help limit and resolve swelling.  It is recommended to use Ace wraps or TED hose until you are informed to stop.    When using Ace Wraps start the wrapping distally (farthest away from the body) and wrap proximally (closer to the body)   Example: If you had surgery on your leg or thing and you do not have a splint on, start the ace wrap at the toes and work your way up to the thigh        If you had surgery on your upper extremity and do not have a splint on, start the ace wrap at your fingers and work your way up to the upper arm  IF YOU ARE IN A SPLINT OR CAST DO NOT [place]   If your splint gets wet for any reason please contact the office immediately. You may shower in your splint or cast as long as you keep it dry.  This can be done by wrapping in a cast cover or garbage back (or similar)  Do Not stick any thing down your splint or cast such as pencils, money, or hangers to try and scratch yourself with.  If you feel itchy take benadryl as prescribed on the bottle for itching  IF YOU ARE IN A CAM BOOT (BLACK BOOT)  You may remove  boot periodically. Perform daily dressing changes as noted below.  Wash the liner of the boot regularly and wear a sock when wearing the boot. It is recommended that you sleep in the boot until told otherwise    Call office for the following:  Temperature greater than 101F  Persistent nausea and vomiting  Severe uncontrolled pain  Redness, tenderness, or signs of infection (pain, swelling, redness, odor or green/yellow discharge around the site)  Difficulty breathing, headache or visual disturbances  Hives  Persistent dizziness or light-headedness  Extreme fatigue  Any other questions or concerns you may have after discharge  In an emergency, call 911 or go to an Emergency Department at a nearby hospital   [place]: [phone]   VISIT OUR WEBSITE FOR ADDITIONAL INFORMATION: [email or web address]

## 2019-10-20 NOTE — Assessment & Plan Note (Addendum)
7/20, patient underwent ORIF. -Currently pain controlled on fentanyl patch and Norco as needed. - NWB 4-6 weeks per ortho; patient unable to go back to SNF per insurance; will pursue long term care placement with transition back to SNF once patient more weight bearing and mobile again  -Lovenox for DVT prophylaxis. - monitor Hgb

## 2019-10-20 NOTE — Progress Notes (Signed)
PROGRESS NOTE    Betty Jackson   ELF:[id number]  DOB: 1934/06/02  DOA: 10/17/2019     3  PCP: Eulas Post, MD  CC: fall  Hospital Course: Betty Jackson is a 85 y.o. female with PMH significant for HTN, HLD, CAD, paroxysmal A. fib not on anticoagulation because of falls, CVA, chronic diastolic CHF, CKD, chronic anemia, chronic low back pain, rheumatoid arthritis, giant cell arteritis, diverticulosis, GERD, hemorrhoids, anxiety. She also had multiple recent hospitalizations for various problems including C. Difficile, diverticulitis, GI bleed, hand cellulitis.  Patient was recently admitted earlier this month (6/28-7/2) after mechanical fall and distal R femur fx. She was supposed to be NWB 6-8 weeks on the R femur, was in SNF for rehab. On 7/20, patient fell out of bed while trying to turn off her light.  She complained severe pain on the right femur/knee area and was hence brought to the ED.   Imaging in the ED noted right distal femur is fractured again at site of prior repair along with large joint effusion. Orthopedic surgeon Dr. Marcelino Scot was notified by EDP. She underwent repeat ORIF on 10/17/19.  Her Hgb showed signs of downtrending and on 7/22 had down trended to 6.5 g/dL.  She was given a total of 2 units PRBC with good response.  The following morning hemoglobin had improved to 10.5 g/dL on 10/20/2019.   Interval History:  No acute events overnight.  Patient sitting up in chair at bedside this morning in no acute distress.  Endorses no change in swelling in her legs, nor any bruising.  Tentative plan is for patient to return to [facility] at discharge.  Old records reviewed in assessment of this patient  ROS: Constitutional: negative for chills and fevers, Respiratory: negative for cough, Cardiovascular: negative for chest pain and Gastrointestinal: negative for abdominal pain  Assessment & Plan: Closed fracture of right distal femur ([employer]) 7/20, patient underwent ORIF. -Currently  pain controlled on fentanyl patch and Norco as needed. -Lovenox for DVT prophylaxis. - monitor Hgb  Major depressive disorder, recurrent episode, moderate (HCC) -Continue Lexapro  PAF (paroxysmal atrial fibrillation) (HCC) -Continue Lovenox and Cardizem  HTN (hypertension) -Continue Cardizem  AKI (acute kidney injury) ([place]) Creatinine elevated 1.3 on admission.  Baseline less than 1. -Normalized with IV fluids  Pressure injury of skin - continue routine turing and working with PT to get OOB  Giant cell arteritis ([place]) On chronic prednisone 10mg currently for a slow dose taper -Continue prednisone 10 mg daily. -Start Protonix while on steroids and Lovenox.   Antimicrobials: N/A  DVT prophylaxis: Lovenox Code Status: Full Family Communication: None present Disposition Plan:  Status is: Inpatient  Remains inpatient appropriate because:Unsafe d/c plan and Inpatient level of care appropriate due to severity of illness   Dispo: The patient is from: SNF              Anticipated d/c is to: SNF              Anticipated d/c date is: 2 days              Patient currently is not medically stable to d/c.       Objective: Blood pressure (!) 162/71, pulse 75, temperature 98.1 F (36.7 C), temperature source Oral, resp. rate 18, height 5\' 4"  (1.626 m), weight 68 kg, SpO2 98 %.  Examination: General appearance: alert, cooperative and no distress Head: Normocephalic, without obvious abnormality Eyes: EOMI Lungs: clear to auscultation bilaterally Heart: regular  rate and rhythm and S1, S2 normal Abdomen: normal findings: bowel sounds normal and soft, non-tender Extremities: No edema Skin: mobility and turgor normal Neurologic: Grossly normal   Consultants:   Ortho  Data Reviewed: I have personally reviewed following labs and imaging studies Results for orders placed or performed during the hospital encounter of 10/17/19 (from the past 24 hour(s))  CBC     Status:  Abnormal   Collection Time: 10/20/19  1:56 AM  Result Value Ref Range   WBC 12.4 (H) 4.0 - 10.5 K/uL   RBC 3.59 (L) 3.87 - 5.11 MIL/uL   Hemoglobin 10.5 (L) 12.0 - 15.0 g/dL   HCT 32.5 (L) 36 - 46 %   MCV 90.5 80.0 - 100.0 fL   MCH 29.2 26.0 - 34.0 pg   MCHC 32.3 30.0 - 36.0 g/dL   RDW 15.5 11.5 - 15.5 %   Platelets 194 150 - 400 K/uL   nRBC 0.2 0.0 - 0.2 %    Recent Results (from the past 240 hour(s))  SARS Coronavirus 2 by RT PCR (hospital order, performed in [facility] hospital lab) Nasopharyngeal Nasopharyngeal Swab     Status: None   Collection Time: 10/17/19  2:17 AM   Specimen: Nasopharyngeal Swab  Result Value Ref Range Status   SARS Coronavirus 2 NEGATIVE NEGATIVE Final    Comment: (NOTE) SARS-CoV-2 target nucleic acids are NOT DETECTED.  The SARS-CoV-2 RNA is generally detectable in upper and lower respiratory specimens during the acute phase of infection. The lowest concentration of SARS-CoV-2 viral copies this assay can detect is 250 copies / mL. A negative result does not preclude SARS-CoV-2 infection and should not be used as the sole basis for treatment or other patient management decisions.  A negative result may occur with improper specimen collection / handling, submission of specimen other than nasopharyngeal swab, presence of viral mutation(s) within the areas targeted by this assay, and inadequate number of viral copies (<250 copies / mL). A negative result must be combined with clinical observations, patient history, and epidemiological information.  Fact Sheet for Patients:   StrictlyIdeas.no  Fact Sheet for Healthcare Providers: BankingDealers.co.za  This test is not yet approved or  cleared by the [place] FDA and has been authorized for detection and/or diagnosis of SARS-CoV-2 by FDA under an Emergency Use Authorization (EUA).  This EUA will remain in effect (meaning this test can be used) for  the duration of the COVID-19 declaration under Section 564(b)(1) of the Act, 21 U.S.C. section 360bbb-3(b)(1), unless the authorization is terminated or revoked sooner.  Performed at [facility], [place] [address], [place], [place] [address]   Surgical pcr screen     Status: None   Collection Time: 10/17/19  1:20 PM   Specimen: Nasal Mucosa; Nasal Swab  Result Value Ref Range Status   MRSA, PCR NEGATIVE NEGATIVE Final   Staphylococcus aureus NEGATIVE NEGATIVE Final    Comment: (NOTE) The Xpert SA Assay (FDA approved for NASAL specimens in patients 48 years of age and older), is one component of a comprehensive surveillance program. It is not intended to diagnose infection nor to guide or monitor treatment. Performed at [facility], [place] [address], [place], [place] [address]      Radiology Studies: No results found. DG Knee Right Port  Final Result    DG C-Arm 1-60 Min  Final Result    DG FEMUR, MIN 2 VIEWS RIGHT  Final Result    Chest  Portable 1 View  Final Result    CT Head Wo Contrast  Final Result    CT Cervical Spine Wo Contrast  Final Result    DG Femur Min 2 Views Right  Final Result    DG Knee Complete 4 Views Right  Final Result    DG Hip Unilat W or Wo Pelvis 2-3 Views Right  Final Result    DG Hip Unilat W or Wo Pelvis 2-3 Views Left  Final Result       Scheduled Meds: . sodium chloride   Intravenous Once  . cholecalciferol  2,000 Units Oral BID  . diltiazem  240 mg Oral Daily  . docusate sodium  100 mg Oral BID  . enoxaparin (LOVENOX) injection  30 mg Subcutaneous Q24H  . escitalopram  10 mg Oral Daily  . famotidine  20 mg Oral Daily  . fentaNYL  1 patch Transdermal Q72H  . ferrous sulfate  325 mg Oral BID WC  . furosemide  40 mg Oral Daily  . mouth rinse  15 mL Mouth Rinse BID  . metoprolol tartrate  50 mg Oral BID  . multivitamin with minerals  1 tablet Oral Daily  . pantoprazole  40 mg Oral Daily  . potassium chloride   10 mEq Oral Daily  . predniSONE  10 mg Oral Q breakfast  . senna-docusate  1 tablet Oral QHS   PRN Meds: HYDROcodone-acetaminophen, menthol-cetylpyridinium **OR** phenol, metoCLOPramide **OR** metoCLOPramide (REGLAN) injection, morphine injection, ondansetron **OR** ondansetron (ZOFRAN) IV Continuous Infusions:    LOS: 3 days  Time spent: Greater than 50% of the 35 minute visit was spent in counseling/coordination of care for the patient as laid out in the A&P.   Dwyane Dee, MD [facility] 10/20/2019, 5:01 PM   Contact via secure chat.  To contact the attending provider between 7A-7P or the covering provider during after hours 7P-7A, please log into the web site [email or web address] and access using universal [place] password for that web site. If you do not have the password, please call the hospital operator.

## 2019-10-21 LAB — BASIC METABOLIC PANEL
Anion gap: 8 (ref 5–15)
BUN: 10 mg/dL (ref 8–23)
CO2: 31 mmol/L (ref 22–32)
Calcium: 8.5 mg/dL — ABNORMAL LOW (ref 8.9–10.3)
Chloride: 99 mmol/L (ref 98–111)
Creatinine, Ser: 0.75 mg/dL (ref 0.44–1.00)
GFR calc Af Amer: >60 mL/min (ref >60)
GFR calc non Af Amer: >60 mL/min (ref >60)
Glucose, Bld: 95 mg/dL (ref 70–99)
Potassium: 3.4 mmol/L — ABNORMAL LOW (ref 3.5–5.1)
Sodium: 138 mmol/L (ref 135–145)

## 2019-10-21 LAB — CBC WITH DIFFERENTIAL/PLATELET
Abs Immature Granulocytes: 0.12 10*3/uL — ABNORMAL HIGH (ref 0.00–0.07)
Basophils Absolute: 0 10*3/uL (ref 0.0–0.1)
Basophils Relative: 0 %
Eosinophils Absolute: 0 10*3/uL (ref 0.0–0.5)
Eosinophils Relative: 0 %
HCT: 32.2 % — ABNORMAL LOW (ref 36.0–46.0)
Hemoglobin: 10.4 g/dL — ABNORMAL LOW (ref 12.0–15.0)
Immature Granulocytes: 2 %
Lymphocytes Relative: 23 %
Lymphs Abs: 1.8 10*3/uL (ref 0.7–4.0)
MCH: 29.7 pg (ref 26.0–34.0)
MCHC: 32.3 g/dL (ref 30.0–36.0)
MCV: 92 fL (ref 80.0–100.0)
Monocytes Absolute: 0.8 10*3/uL (ref 0.1–1.0)
Monocytes Relative: 11 %
Neutro Abs: 5.1 10*3/uL (ref 1.7–7.7)
Neutrophils Relative %: 64 %
Platelets: 194 10*3/uL (ref 150–400)
RBC: 3.5 MIL/uL — ABNORMAL LOW (ref 3.87–5.11)
RDW: 14.7 % (ref 11.5–15.5)
WBC: 7.9 10*3/uL (ref 4.0–10.5)
nRBC: 0 % (ref 0.0–0.2)

## 2019-10-21 LAB — MAGNESIUM: Magnesium: 1.7 mg/dL (ref 1.7–2.4)

## 2019-10-21 NOTE — TOC Progression Note (Addendum)
Transition of Care [facility]) - Progression Note    Patient Details  Name: Betty Jackson MRN: [id number] Date of Birth: 1934/10/19  Transition of [facility]) CM/SW Contact  Elliot Gurney [place], [place] Phone Number: [phone] 10/21/2019, 1:13 PM  Clinical Narrative:    Phone call to [facility] health regarding authorization for SNF. Spoke with [name] who was not able to provide authorization at this time. Peer to Peer offered as well as the options of patient returning to the facility under private pay and part B services. Phone call to [facility] admissions department  to discuss above options. VM left requesting a return call.  2:15pm Phone call to [facility]-Peer to Peer review recommended. Phone call to [facility] to request peer review.  [place], LCSW Transitions of Care [phone]    Expected Discharge Plan: Skilled Nursing Facility Barriers to Discharge: Continued Medical Work up  Expected Discharge Plan and Services Expected Discharge Plan: [place]       Living arrangements for the past 2 months: [place] Expected Discharge Date: 10/21/19                                     Social Determinants of Health (SDOH) Interventions    Readmission Risk Interventions Readmission Risk Prevention Plan 09/26/2019 06/21/2019 06/01/2019  Transportation Screening Complete Complete Complete  Medication Review Press photographer) Complete Complete Complete  PCP or Specialist appointment within 3-5 days of discharge Complete Not Complete Complete  PCP/Specialist Appt Not Complete comments - likely SNF placement -  [place] or Home Care Consult Complete Not Complete Complete  HRI or Home Care Consult Pt Refusal Comments - likely SNF -  SW Recovery Care/Counseling Consult Complete Complete Complete  Palliative Care Screening Complete Complete -  [place] Complete Complete Not Applicable  Some recent data might be hidden

## 2019-10-21 NOTE — Progress Notes (Signed)
PROGRESS NOTE    Betty Jackson   KNL:[id number]  DOB: [birth year]  DOA: 10/17/2019     4  PCP: Eulas Post, MD  CC: fall  Hospital Course: Betty Jackson is a 85 y.o. female with PMH significant for HTN, HLD, CAD, paroxysmal A. fib not on anticoagulation because of falls, CVA, chronic diastolic CHF, CKD, chronic anemia, chronic low back pain, rheumatoid arthritis, giant cell arteritis, diverticulosis, GERD, hemorrhoids, anxiety. She also had multiple recent hospitalizations for various problems including C. Difficile, diverticulitis, GI bleed, hand cellulitis.  Patient was recently admitted earlier this month (6/28-7/2) after mechanical fall and distal R femur fx. She was supposed to be NWB 6-8 weeks on the R femur, was in SNF for rehab. On 7/20, patient fell out of bed while trying to turn off her light.  She complained severe pain on the right femur/knee area and was hence brought to the ED.   Imaging in the ED noted right distal femur is fractured again at site of prior repair along with large joint effusion. Orthopedic surgeon Dr. Marcelino Scot was notified by EDP. She underwent repeat ORIF on 10/17/19.  Her Hgb showed signs of downtrending and on 7/22 had down trended to 6.5 g/dL.  She was given a total of 2 units PRBC with good response.  The following morning hemoglobin had improved to 10.5 g/dL on 10/20/2019.   Interval History:  No acute events overnight.  Resting in bed in no distress in the morning.  Her son was bedside and we reviewed her hemoglobin and ongoing recovery.  Plan is for discharging back to her SNF at time of discharge.  Initially plan was for d/c on 7/23 however due to denial of insurance auth patient was kept in hospital until peer to peer could take place.   Old records reviewed in assessment of this patient  ROS: Constitutional: negative for chills and fevers, Respiratory: negative for cough, Cardiovascular: negative for chest pain and Gastrointestinal: negative  for abdominal pain  Assessment & Plan: Closed fracture of right distal femur ([place]) 7/20, patient underwent ORIF. -Currently pain controlled on fentanyl patch and Norco as needed. -Lovenox for DVT prophylaxis. - monitor Hgb  Major depressive disorder, recurrent episode, moderate (HCC) -Continue Lexapro  PAF (paroxysmal atrial fibrillation) (HCC) -Continue Lovenox and Cardizem  HTN (hypertension) -Continue Cardizem  AKI (acute kidney injury) ([place]) Creatinine elevated 1.3 on admission.  Baseline less than 1. -Normalized with IV fluids  Pressure injury of skin - continue routine turing and working with PT to get OOB  Giant cell arteritis ([place]) On chronic prednisone 10mg currently for a slow dose taper -Continue prednisone 10 mg daily. -Start Protonix while on steroids and Lovenox.   Antimicrobials: N/A  DVT prophylaxis: Lovenox Code Status: Full Family Communication: None present Disposition Plan:  Status is: Inpatient  Remains inpatient appropriate because:Unsafe d/c plan and Inpatient level of care appropriate due to severity of illness   Dispo: The patient is from: SNF              Anticipated d/c is to: SNF              Anticipated d/c date is: 2 days              Patient currently is not medically stable to d/c.  Objective: Blood pressure (!) 162/68, pulse 74, temperature 99.5 F (37.5 C), temperature source Oral, resp. rate 17, height 5\' 4"  (1.626 m), weight 68 kg, SpO2 96 %.  Examination:  General appearance: alert, cooperative and no distress Head: Normocephalic, without obvious abnormality Eyes: EOMI Lungs: clear to auscultation bilaterally Heart: regular rate and rhythm and S1, S2 normal Abdomen: normal findings: bowel sounds normal and soft, non-tender Extremities: No edema Skin: mobility and turgor normal Neurologic: Grossly normal   Consultants:   Ortho  Data Reviewed: I have personally reviewed following labs and imaging studies Results for  orders placed or performed during the hospital encounter of 10/17/19 (from the past 24 hour(s))  Basic metabolic panel     Status: Abnormal   Collection Time: 10/21/19  3:11 AM  Result Value Ref Range   Sodium 138 135 - 145 mmol/L   Potassium 3.4 (L) 3.5 - 5.1 mmol/L   Chloride 99 98 - 111 mmol/L   CO2 31 22 - 32 mmol/L   Glucose, Bld 95 70 - 99 mg/dL   BUN 10 8 - 23 mg/dL   Creatinine, Ser 0.75 0.44 - 1.00 mg/dL   Calcium 8.5 (L) 8.9 - 10.3 mg/dL   GFR calc non Af Amer >60 >60 mL/min   GFR calc Af Amer >60 >60 mL/min   Anion gap 8 5 - 15  CBC with Differential/Platelet     Status: Abnormal   Collection Time: 10/21/19  3:11 AM  Result Value Ref Range   WBC 7.9 4.0 - 10.5 K/uL   RBC 3.50 (L) 3.87 - 5.11 MIL/uL   Hemoglobin 10.4 (L) 12.0 - 15.0 g/dL   HCT 32.2 (L) 36 - 46 %   MCV 92.0 80.0 - 100.0 fL   MCH 29.7 26.0 - 34.0 pg   MCHC 32.3 30.0 - 36.0 g/dL   RDW 14.7 11.5 - 15.5 %   Platelets 194 150 - 400 K/uL   nRBC 0.0 0.0 - 0.2 %   Neutrophils Relative % 64 %   Neutro Abs 5.1 1.7 - 7.7 K/uL   Lymphocytes Relative 23 %   Lymphs Abs 1.8 0.7 - 4.0 K/uL   Monocytes Relative 11 %   Monocytes Absolute 0.8 0 - 1 K/uL   Eosinophils Relative 0 %   Eosinophils Absolute 0.0 0 - 0 K/uL   Basophils Relative 0 %   Basophils Absolute 0.0 0 - 0 K/uL   Immature Granulocytes 2 %   Abs Immature Granulocytes 0.12 (H) 0.00 - 0.07 K/uL  Magnesium     Status: None   Collection Time: 10/21/19  3:11 AM  Result Value Ref Range   Magnesium 1.7 1.7 - 2.4 mg/dL    Recent Results (from the past 240 hour(s))  SARS Coronavirus 2 by RT PCR (hospital order, performed in [place] hospital lab) Nasopharyngeal Nasopharyngeal Swab     Status: None   Collection Time: 10/17/19  2:17 AM   Specimen: Nasopharyngeal Swab  Result Value Ref Range Status   SARS Coronavirus 2 NEGATIVE NEGATIVE Final    Comment: (NOTE) SARS-CoV-2 target nucleic acids are NOT DETECTED.  The SARS-CoV-2 RNA is generally  detectable in upper and lower respiratory specimens during the acute phase of infection. The lowest concentration of SARS-CoV-2 viral copies this assay can detect is 250 copies / mL. A negative result does not preclude SARS-CoV-2 infection and should not be used as the sole basis for treatment or other patient management decisions.  A negative result may occur with improper specimen collection / handling, submission of specimen other than nasopharyngeal swab, presence of viral mutation(s) within the areas targeted by this assay, and inadequate number of viral copies (<250  copies / mL). A negative result must be combined with clinical observations, patient history, and epidemiological information.  Fact Sheet for Patients:   StrictlyIdeas.no  Fact Sheet for Healthcare Providers: BankingDealers.co.za  This test is not yet approved or  cleared by the [place] FDA and has been authorized for detection and/or diagnosis of SARS-CoV-2 by FDA under an Emergency Use Authorization (EUA).  This EUA will remain in effect (meaning this test can be used) for the duration of the COVID-19 declaration under Section 564(b)(1) of the Act, 21 U.S.C. section 360bbb-3(b)(1), unless the authorization is terminated or revoked sooner.  Performed at [facility], [place] [address], [place], [place] [address]   Surgical pcr screen     Status: None   Collection Time: 10/17/19  1:20 PM   Specimen: Nasal Mucosa; Nasal Swab  Result Value Ref Range Status   MRSA, PCR NEGATIVE NEGATIVE Final   Staphylococcus aureus NEGATIVE NEGATIVE Final    Comment: (NOTE) The Xpert SA Assay (FDA approved for NASAL specimens in patients 89 years of age and older), is one component of a comprehensive surveillance program. It is not intended to diagnose infection nor to guide or monitor treatment. Performed at [facility], [place] [address] [place],  [place] [address]      Radiology Studies: No results found. DG Knee Right Port  Final Result    DG C-Arm 1-60 Min  Final Result    DG FEMUR, MIN 2 VIEWS RIGHT  Final Result    Chest Portable 1 View  Final Result    CT Head Wo Contrast  Final Result    CT Cervical Spine Wo Contrast  Final Result    DG Femur Min 2 Views Right  Final Result    DG Knee Complete 4 Views Right  Final Result    DG Hip Unilat W or Wo Pelvis 2-3 Views Right  Final Result    DG Hip Unilat W or Wo Pelvis 2-3 Views Left  Final Result       Scheduled Meds: . sodium chloride   Intravenous Once  . cholecalciferol  2,000 Units Oral BID  . diltiazem  240 mg Oral Daily  . docusate sodium  100 mg Oral BID  . enoxaparin (LOVENOX) injection  30 mg Subcutaneous Q24H  . escitalopram  10 mg Oral Daily  . famotidine  20 mg Oral Daily  . fentaNYL  1 patch Transdermal Q72H  . ferrous sulfate  325 mg Oral BID WC  . furosemide  40 mg Oral Daily  . mouth rinse  15 mL Mouth Rinse BID  . metoprolol tartrate  50 mg Oral BID  . multivitamin with minerals  1 tablet Oral Daily  . pantoprazole  40 mg Oral Daily  . potassium chloride  10 mEq Oral Daily  . predniSONE  10 mg Oral Q breakfast  . senna-docusate  1 tablet Oral QHS   PRN Meds: HYDROcodone-acetaminophen, menthol-cetylpyridinium **OR** phenol, metoCLOPramide **OR** metoCLOPramide (REGLAN) injection, morphine injection, ondansetron **OR** ondansetron (ZOFRAN) IV Continuous Infusions:    LOS: 4 days  Time spent: Greater than 50% of the 35 minute visit was spent in counseling/coordination of care for the patient as laid out in the A&P.   Dwyane Dee, MD [facility] 10/21/2019, 5:35 PM   Contact via secure chat.  To contact the attending provider between 7A-7P or the covering provider during after hours 7P-7A, please log into the web site [email or web address] and access using universal [place]  password for that web site. If you do not have the  password, please call the hospital operator.

## 2019-10-21 NOTE — Plan of Care (Signed)

## 2019-10-22 LAB — CBC WITH DIFFERENTIAL/PLATELET
Abs Immature Granulocytes: 0.19 10*3/uL — ABNORMAL HIGH (ref 0.00–0.07)
Basophils Absolute: 0 10*3/uL (ref 0.0–0.1)
Basophils Relative: 0 %
Eosinophils Absolute: 0 10*3/uL (ref 0.0–0.5)
Eosinophils Relative: 0 %
HCT: 34.4 % — ABNORMAL LOW (ref 36.0–46.0)
Hemoglobin: 11.1 g/dL — ABNORMAL LOW (ref 12.0–15.0)
Immature Granulocytes: 2 %
Lymphocytes Relative: 19 %
Lymphs Abs: 1.8 10*3/uL (ref 0.7–4.0)
MCH: 29.4 pg (ref 26.0–34.0)
MCHC: 32.3 g/dL (ref 30.0–36.0)
MCV: 91 fL (ref 80.0–100.0)
Monocytes Absolute: 1.2 10*3/uL — ABNORMAL HIGH (ref 0.1–1.0)
Monocytes Relative: 12 %
Neutro Abs: 6.2 10*3/uL (ref 1.7–7.7)
Neutrophils Relative %: 67 %
Platelets: 222 10*3/uL (ref 150–400)
RBC: 3.78 MIL/uL — ABNORMAL LOW (ref 3.87–5.11)
RDW: 14.4 % (ref 11.5–15.5)
WBC: 9.4 10*3/uL (ref 4.0–10.5)
nRBC: 0.2 % (ref 0.0–0.2)

## 2019-10-22 LAB — BASIC METABOLIC PANEL
Anion gap: 10 (ref 5–15)
BUN: 9 mg/dL (ref 8–23)
CO2: 30 mmol/L (ref 22–32)
Calcium: 8.7 mg/dL — ABNORMAL LOW (ref 8.9–10.3)
Chloride: 98 mmol/L (ref 98–111)
Creatinine, Ser: 0.72 mg/dL (ref 0.44–1.00)
GFR calc Af Amer: >60 mL/min (ref >60)
GFR calc non Af Amer: >60 mL/min (ref >60)
Glucose, Bld: 99 mg/dL (ref 70–99)
Potassium: 3.2 mmol/L — ABNORMAL LOW (ref 3.5–5.1)
Sodium: 138 mmol/L (ref 135–145)

## 2019-10-22 LAB — MAGNESIUM: Magnesium: 1.6 mg/dL — ABNORMAL LOW (ref 1.7–2.4)

## 2019-10-22 MED ORDER — ACETAMINOPHEN 325 MG PO TABS
650.0000 mg | ORAL_TABLET | ORAL | Status: DC | PRN
  Administered 2019-10-22: 650 mg via ORAL
  Filled 2019-10-22: qty 2

## 2019-10-22 MED ORDER — ACETAMINOPHEN 500 MG PO TABS: 500.0000 mg | ORAL_TABLET | Freq: Four times a day (QID) | ORAL | Status: DC | PRN

## 2019-10-22 MED ORDER — POTASSIUM CHLORIDE CRYS ER 20 MEQ PO TBCR
40.0000 meq | EXTENDED_RELEASE_TABLET | Freq: Once | ORAL | Status: AC
  Administered 2019-10-22: 40 meq via ORAL
  Filled 2019-10-22: qty 2

## 2019-10-22 MED ORDER — HYDROCODONE-ACETAMINOPHEN 10-325 MG PO TABS
1.0000 | ORAL_TABLET | Freq: Three times a day (TID) | ORAL | Status: DC | PRN
  Administered 2019-10-22 – 2019-10-23 (×2): 1 via ORAL
  Filled 2019-10-22 (×2): qty 1

## 2019-10-22 MED ORDER — MAGNESIUM OXIDE 400 (241.3 MG) MG PO TABS
800.0000 mg | ORAL_TABLET | Freq: Once | ORAL | Status: AC
  Administered 2019-10-22: 800 mg via ORAL
  Filled 2019-10-22: qty 2

## 2019-10-22 NOTE — Plan of Care (Signed)

## 2019-10-22 NOTE — Progress Notes (Signed)
Patient reports that she hit her left wrist to bedrail last night. Complained of sore. Bruise and swelling noted. Applied ice.

## 2019-10-22 NOTE — Progress Notes (Signed)
PROGRESS NOTE    Betty Jackson   DXI:[id number]  DOB: 1934-07-29  DOA: 10/17/2019     5  PCP: Eulas Post, MD  CC: fall  Hospital Course: Betty Jackson is a 85 y.o. female with PMH significant for HTN, HLD, CAD, paroxysmal A. fib not on anticoagulation because of falls, CVA, chronic diastolic CHF, CKD, chronic anemia, chronic low back pain, rheumatoid arthritis, giant cell arteritis, diverticulosis, GERD, hemorrhoids, anxiety. She also had multiple recent hospitalizations for various problems including C. Difficile, diverticulitis, GI bleed, hand cellulitis.  Patient was recently admitted earlier this month (6/28-7/2) after mechanical fall and distal R femur fx. She was supposed to be NWB 6-8 weeks on the R femur, was in SNF for rehab. On 7/20, patient fell out of bed while trying to turn off her light.  She complained severe pain on the right femur/knee area and was hence brought to the ED.   Imaging in the ED noted right distal femur is fractured again at site of prior repair along with large joint effusion. Orthopedic surgeon Dr. Marcelino Scot was notified by EDP. She underwent repeat ORIF on 10/17/19.  Her Hgb showed signs of downtrending and on 7/22 had down trended to 6.5 g/dL.  She was given a total of 2 units PRBC with good response.  The following morning hemoglobin had improved to 10.5 g/dL on 10/20/2019. With further monitoring, and remained stable.   Interval History:  No acute events overnight. Otherwise still awaiting discharge back to rehab once insurance approves.  Old records reviewed in assessment of this patient  ROS: Constitutional: negative for chills and fevers, Respiratory: negative for cough, Cardiovascular: negative for chest pain and Gastrointestinal: negative for abdominal pain  Assessment & Plan: Closed fracture of right distal femur ([place]) 7/20, patient underwent ORIF. -Currently pain controlled on fentanyl patch and Norco as needed. -Lovenox for DVT  prophylaxis. - monitor Hgb  Major depressive disorder, recurrent episode, moderate (HCC) -Continue Lexapro  PAF (paroxysmal atrial fibrillation) (HCC) -Continue Lovenox and Cardizem -Was taken off of Eliquis by cardiology due to her increasing fall risk  HTN (hypertension) -Continue Cardizem  AKI (acute kidney injury) ([place]) Creatinine elevated 1.3 on admission.  Baseline less than 1. -Normalized with IV fluids  Pressure injury of skin - continue routine turing and working with PT to get OOB  Giant cell arteritis ([place]) On chronic prednisone 10mg currently for a slow dose taper -Continue prednisone 10 mg daily. -Start Protonix while on steroids and Lovenox.   Antimicrobials: N/A  DVT prophylaxis: Lovenox Code Status: Full Family Communication: None present Disposition Plan:  Status is: Inpatient  Remains inpatient appropriate because:Unsafe d/c plan and Inpatient level of care appropriate due to severity of illness   Dispo: The patient is from: SNF              Anticipated d/c is to: SNF              Anticipated d/c date is: 2 days              Patient currently is not medically stable to d/c.  Objective: Blood pressure (!) 155/78, pulse 95, temperature 98.4 F (36.9 C), temperature source Oral, resp. rate 17, height 5\' 4"  (1.626 m), weight 68 kg, SpO2 95 %.  Examination: General appearance: alert, cooperative and no distress Head: Normocephalic, without obvious abnormality Eyes: EOMI Lungs: clear to auscultation bilaterally Heart: regular rate and rhythm and S1, S2 normal Abdomen: normal findings: bowel sounds normal and  soft, non-tender Extremities: No edema. Surgical dressing in place with no surrounding oozing or weeping. Compartments are soft. Skin: mobility and turgor normal Neurologic: Grossly normal   Consultants:   Ortho  Data Reviewed: I have personally reviewed following labs and imaging studies Results for orders placed or performed during the  hospital encounter of 10/17/19 (from the past 24 hour(s))  Basic metabolic panel     Status: Abnormal   Collection Time: 10/22/19  3:02 AM  Result Value Ref Range   Sodium 138 135 - 145 mmol/L   Potassium 3.2 (L) 3.5 - 5.1 mmol/L   Chloride 98 98 - 111 mmol/L   CO2 30 22 - 32 mmol/L   Glucose, Bld 99 70 - 99 mg/dL   BUN 9 8 - 23 mg/dL   Creatinine, Ser 0.72 0.44 - 1.00 mg/dL   Calcium 8.7 (L) 8.9 - 10.3 mg/dL   GFR calc non Af Amer >60 >60 mL/min   GFR calc Af Amer >60 >60 mL/min   Anion gap 10 5 - 15  CBC with Differential/Platelet     Status: Abnormal   Collection Time: 10/22/19  3:02 AM  Result Value Ref Range   WBC 9.4 4.0 - 10.5 K/uL   RBC 3.78 (L) 3.87 - 5.11 MIL/uL   Hemoglobin 11.1 (L) 12.0 - 15.0 g/dL   HCT 34.4 (L) 36 - 46 %   MCV 91.0 80.0 - 100.0 fL   MCH 29.4 26.0 - 34.0 pg   MCHC 32.3 30.0 - 36.0 g/dL   RDW 14.4 11.5 - 15.5 %   Platelets 222 150 - 400 K/uL   nRBC 0.2 0.0 - 0.2 %   Neutrophils Relative % 67 %   Neutro Abs 6.2 1.7 - 7.7 K/uL   Lymphocytes Relative 19 %   Lymphs Abs 1.8 0.7 - 4.0 K/uL   Monocytes Relative 12 %   Monocytes Absolute 1.2 (H) 0 - 1 K/uL   Eosinophils Relative 0 %   Eosinophils Absolute 0.0 0 - 0 K/uL   Basophils Relative 0 %   Basophils Absolute 0.0 0 - 0 K/uL   Immature Granulocytes 2 %   Abs Immature Granulocytes 0.19 (H) 0.00 - 0.07 K/uL  Magnesium     Status: Abnormal   Collection Time: 10/22/19  3:02 AM  Result Value Ref Range   Magnesium 1.6 (L) 1.7 - 2.4 mg/dL    Recent Results (from the past 240 hour(s))  SARS Coronavirus 2 by RT PCR (hospital order, performed in [place] hospital lab) Nasopharyngeal Nasopharyngeal Swab     Status: None   Collection Time: 10/17/19  2:17 AM   Specimen: Nasopharyngeal Swab  Result Value Ref Range Status   SARS Coronavirus 2 NEGATIVE NEGATIVE Final    Comment: (NOTE) SARS-CoV-2 target nucleic acids are NOT DETECTED.  The SARS-CoV-2 RNA is generally detectable in upper and  lower respiratory specimens during the acute phase of infection. The lowest concentration of SARS-CoV-2 viral copies this assay can detect is 250 copies / mL. A negative result does not preclude SARS-CoV-2 infection and should not be used as the sole basis for treatment or other patient management decisions.  A negative result may occur with improper specimen collection / handling, submission of specimen other than nasopharyngeal swab, presence of viral mutation(s) within the areas targeted by this assay, and inadequate number of viral copies (<250 copies / mL). A negative result must be combined with clinical observations, patient history, and epidemiological information.  Fact Sheet  for Patients:   StrictlyIdeas.no  Fact Sheet for Healthcare Providers: BankingDealers.co.za  This test is not yet approved or  cleared by the [place] FDA and has been authorized for detection and/or diagnosis of SARS-CoV-2 by FDA under an Emergency Use Authorization (EUA).  This EUA will remain in effect (meaning this test can be used) for the duration of the COVID-19 declaration under Section 564(b)(1) of the Act, 21 U.S.C. section 360bbb-3(b)(1), unless the authorization is terminated or revoked sooner.  Performed at [facility], [place] [address], [place], [place] [address]   Surgical pcr screen     Status: None   Collection Time: 10/17/19  1:20 PM   Specimen: Nasal Mucosa; Nasal Swab  Result Value Ref Range Status   MRSA, PCR NEGATIVE NEGATIVE Final   Staphylococcus aureus NEGATIVE NEGATIVE Final    Comment: (NOTE) The Xpert SA Assay (FDA approved for NASAL specimens in patients 11 years of age and older), is one component of a comprehensive surveillance program. It is not intended to diagnose infection nor to guide or monitor treatment. Performed at [facility], [place] [address], [place], [place] [address]      Radiology  Studies: No results found. DG Knee Right Port  Final Result    DG C-Arm 1-60 Min  Final Result    DG FEMUR, MIN 2 VIEWS RIGHT  Final Result    Chest Portable 1 View  Final Result    CT Head Wo Contrast  Final Result    CT Cervical Spine Wo Contrast  Final Result    DG Femur Min 2 Views Right  Final Result    DG Knee Complete 4 Views Right  Final Result    DG Hip Unilat W or Wo Pelvis 2-3 Views Right  Final Result    DG Hip Unilat W or Wo Pelvis 2-3 Views Left  Final Result       Scheduled Meds: . sodium chloride   Intravenous Once  . cholecalciferol  2,000 Units Oral BID  . diltiazem  240 mg Oral Daily  . docusate sodium  100 mg Oral BID  . enoxaparin (LOVENOX) injection  30 mg Subcutaneous Q24H  . escitalopram  10 mg Oral Daily  . famotidine  20 mg Oral Daily  . fentaNYL  1 patch Transdermal Q72H  . ferrous sulfate  325 mg Oral BID WC  . furosemide  40 mg Oral Daily  . mouth rinse  15 mL Mouth Rinse BID  . metoprolol tartrate  50 mg Oral BID  . multivitamin with minerals  1 tablet Oral Daily  . pantoprazole  40 mg Oral Daily  . potassium chloride  10 mEq Oral Daily  . predniSONE  10 mg Oral Q breakfast  . senna-docusate  1 tablet Oral QHS   PRN Meds: acetaminophen, menthol-cetylpyridinium **OR** phenol, metoCLOPramide **OR** metoCLOPramide (REGLAN) injection, morphine injection, ondansetron **OR** ondansetron (ZOFRAN) IV Continuous Infusions:    LOS: 5 days  Time spent: Greater than 50% of the 35 minute visit was spent in counseling/coordination of care for the patient as laid out in the A&P.   Dwyane Dee, MD [facility] 10/22/2019, 1:12 PM   Contact via secure chat.  To contact the attending provider between 7A-7P or the covering provider during after hours 7P-7A, please log into the web site [email or web address] and access using universal [place] password for that web site. If you do not have the password, please call the hospital  operator.

## 2019-10-23 ENCOUNTER — Ambulatory Visit: Payer: Medicare Other | Admitting: Cardiovascular Disease

## 2019-10-23 LAB — SARS CORONAVIRUS 2 (TAT 6-24 HRS): SARS Coronavirus 2: NEGATIVE

## 2019-10-23 MED ORDER — ALPRAZOLAM 0.5 MG PO TABS: 0.5000 mg | ORAL_TABLET | Freq: Every evening | ORAL | 0 refills | Status: DC | PRN

## 2019-10-23 MED ORDER — HYDROCODONE-ACETAMINOPHEN 10-325 MG PO TABS: ORAL_TABLET | ORAL | 0 refills | Status: DC

## 2019-10-23 MED ORDER — PREDNISONE 10 MG PO TABS: 10.0000 mg | ORAL_TABLET | Freq: Every day | ORAL | Status: DC

## 2019-10-23 NOTE — TOC Transition Note (Signed)
Transition of Care [facility]) - CM/SW Discharge Note   Patient Details  Name: Betty Jackson MRN: [id number] Date of Birth: 02-11-1935  [facility] of [facility]) CM/SW Contact:  Sable Feil, LCSW Phone Number: 10/23/2019, 2:55 PM   Clinical Narrative:  Patient medically stable for discharge and will be going to [place]. Discharge clinicals transmitted to facility and son Shawn Stall [phone]) contacted and advised regarding discharge.  [name] will be transported by non-emergency ambulance.     Final next level of care: [place] ([place]) Barriers to Discharge: Barriers Resolved   Patient Goals and CMS Choice Patient states their goals for this hospitalization and ongoing recovery are:: To return back home CMS Medicare.gov Compare Post Acute Care list provided to:: Patient Represenative (must comment) Choice offered to / list presented to : Patient, Adult Children  Discharge Placement   Existing PASRR number confirmed : 10/18/19          Patient chooses bed at: [facility] Patient to be transferred to facility by: Non-emergency ambulance Name of family member notified: Rexene Agent  [phone] Patient and family notified of of transfer: 10/23/19  Discharge Plan and Services                                   Social Determinants of Health (SDOH) Interventions  No SDOH interventions requested or needed at discharge    Readmission Risk Interventions Readmission Risk Prevention Plan 09/26/2019 06/21/2019 06/01/2019  Transportation Screening Complete Complete Complete  Medication Review Press photographer) Complete Complete Complete  PCP or Specialist appointment within 3-5 days of discharge Complete Not Complete Complete  PCP/Specialist Appt Not Complete comments - likely SNF placement -  HRI or Home Care Consult Complete Not Complete Complete  HRI or Home Care Consult Pt Refusal Comments - likely SNF -   SW Recovery Care/Counseling Consult Complete Complete Complete  Palliative Care Screening Complete Complete -  [place] Complete Complete Not Applicable  Some recent data might be hidden

## 2019-10-23 NOTE — Progress Notes (Deleted)
No chief complaint on file.  History of Present Illness: 85 yo female with history of paroxysmal atrial fibrillation, PVCs, PACs, CAD s/p stent RCA, HTN, HLD, GERD, temporal arteritis here today for cardiac follow up. She had a stent placed in the RCA in [place], [place] in 2005. She moved to [facility] for treatment of her temporal arteritis at [facility] and to be near family. She had normal ABI in 2012. Mild carotid disease by dopplers 2014. Cardiac cath 12/09/12 with patent RCA stent, mild disease LAD and Circumflex, normal filling pressures. Echo August 2014 with normal LV size and function, no significant valve issues. She was seen in our office 06/19/14 with c/o chest pain. Stress myoview 06/29/14 with no ischemia. She was admitted to [facility] in February 2019 with influenza A, pneumonia and sepsis. Her beta blocker was held and she developed atrial fibrillation with RVR. She also had frequent PVCs and PACs. She converted back to sinus after several hours. Echo February 2019 with normal LV size and function, grade 2 diastolic dysfunction, mild AI. She was not discharged on anticoagulation. She wore a cardiac monitor and had recurrent atrial fib. CHADS VASC score 4. She was seen by Estella Husk, PA 07/06/17 and she was started on Eliquis. I saw her in May 2019 and she c/o chest pain with exertion. Nuclear stress test 08/31/17 with no ischemia. She was having minor nose bleeds on Eliquis. She was referred to ENT but she did not go to that appointment. I saw her 09/20/17 and she c/o weight gain and worsened LE edema. Lasix was increased and Norvasc was stopped. She was admitted to [facility] in December 2020 with a large pericardial effusion and had a sub-xiphoid window per Dr. Prescott Gum. The effusion was culture negative and pathology only showed inflammation. She was admitted to [facility] in January 2021 with a left ischium fracture. No surgery was performed. Troponin was elevated in the setting of possible sepsis but blood cultures were  negative. Cardiology was consulted. Echo January 2021 with normal LV systolic function, no significant valve disease. She was admitted to [facility] in February 2021 with sepsis secondary to C diff colitis and she had atrial fib with RVR.   She is here today for follow up. The patient denies any chest pain, dyspnea, palpitations, lower extremity edema, orthopnea, PND, dizziness, near syncope or syncope.      Primary Care Physician: Eulas Post, MD   Past Medical History:  Diagnosis Date  . Acute respiratory failure with hypoxia ([place]) 05/20/2017  . Anemia, unspecified 10/28/2012  . Anxiety state, unspecified 10/28/2012  . CAD (coronary artery disease)    a. Stent RCA in [facility];  b. Cath approx 2009 - nonobs per pt report.  . Cataract    immature on the left eye  . Chronic insomnia 02/07/2013  . Chronic lower back pain    scoliosis  . CKD (chronic kidney disease) stage 3, GFR 30-59 ml/min   . CVA (cerebral infarction) 10/29/2012  . DDD (degenerative disc disease)   . Depression   . Diverticulosis   . GERD (gastroesophageal reflux disease) 09/01/2010  . Hemorrhoids   . Herniated nucleus pulposus, L5-S1, right 11/04/2015  . Hyperlipidemia    takes Lipitor daily  . Hypertension    takes Amlodipine,Losartan,Metoprolol,and HCTZ daily  . Incontinence of urine   . Insomnia    takes Restoril nightly  . Lumbar stenosis 04/25/2013  . Major depressive disorder, recurrent episode, moderate ([place]) 07/16/2013  . Osteoporosis   .  PAF (paroxysmal atrial fibrillation) ([place]) 2011   a. lone epidode in 2011 according to notes.  . Rheumatoid arthritis ([place])   . Scoliosis   . Sepsis ([place]) 05/2019  . Temporal arteritis ([place]) 2011   a. followed @ [facility]; potential flareup 10/28/2012/notes 10/28/2012  . Vocal cord dysfunction    "they don't operate properly" (10/28/2012)    Past Surgical History:  Procedure Laterality Date  . ABDOMINAL HYSTERECTOMY  ~ 1984   vaginally  . BACK SURGERY  7-37yrs ago    X Stop  . BLADDER SUSPENSION  2001  . BREAST BIOPSY Right   . CATARACT EXTRACTION W/ INTRAOCULAR LENS IMPLANT Right ~ 08/2012  . CHEST TUBE INSERTION Left 03/08/2019   Procedure: Chest Tube Insertion;  Surgeon: Ivin Poot, MD;  Location: [place];  Service: Thoracic;  Laterality: Left;  . COLONOSCOPY  01/26/2012   Procedure: COLONOSCOPY;  Surgeon: Ladene Artist, MD,FACG;  Location: [facility] ENDOSCOPY;  Service: Endoscopy;  Laterality: N/A;  note the EGD is possible  . CORONARY ANGIOPLASTY WITH STENT PLACEMENT  2006   X 1 stent  . EPIDURAL BLOCK INJECTION    . ESOPHAGOGASTRODUODENOSCOPY  01/26/2012   Procedure: ESOPHAGOGASTRODUODENOSCOPY (EGD);  Surgeon: Ladene Artist, MD,FACG;  Location[facility] ENDOSCOPY;  Service: Endoscopy;  Laterality: N/A;  . FINE NEEDLE ASPIRATION Right 09/26/2019   Procedure: FINE NEEDLE ASPIRATION;  Surgeon: Altamese Welaka, MD;  Location: [place];  Service: Orthopedics;  Laterality: Right;  . HEMIARTHROPLASTY HIP Right 2012  . IR EPIDUROGRAPHY  04/20/2019  . LAPAROSCOPIC CHOLECYSTECTOMY  2001  . LUMBAR FUSION  03/2013  . LUMBAR LAMINECTOMY/DECOMPRESSION MICRODISCECTOMY Right 11/04/2015   Procedure: Right Lumbar Five-Sacral One Microdiskectomy;  Surgeon: Kristeen Miss, MD;  Location: [place] NEURO ORS;  Service: Neurosurgery;  Laterality: Right;  Right L5-S1 Microdiskectomy  . moles removed that required stiches     one on leg and one on face  . ORIF FEMUR FRACTURE Right 09/26/2019   Procedure: OPEN REDUCTION INTERNAL FIXATION (ORIF) DISTAL FEMUR FRACTURE;  Surgeon: Altamese [place], MD;  Location: [place];  Service: Orthopedics;  Laterality: Right;  . ORIF FEMUR FRACTURE Right 10/17/2019   Procedure: OPEN REDUCTION INTERNAL FIXATION (ORIF) DISTAL FEMUR FRACTURE;  Surgeon: Altamese [place], MD;  Location: [place];  Service: Orthopedics;  Laterality: Right;  . SUBXYPHOID PERICARDIAL WINDOW N/A 03/08/2019   Procedure: SUBXYPHOID PERICARDIAL WINDOW;  Surgeon: Ivin Poot, MD;  Location: [place];  Service: Thoracic;  Laterality: N/A;  . TEE WITHOUT CARDIOVERSION N/A 03/08/2019   Procedure: TRANSESOPHAGEAL ECHOCARDIOGRAM (TEE);  Surgeon: Prescott Gum, Collier [place]ina, MD;  Location: [place];  Service: Thoracic;  Laterality: N/A;  . TEMPORAL ARTERY BIOPSY / LIGATION Bilateral 2011  . TONSILLECTOMY AND ADENOIDECTOMY     at age 35  . TUBAL LIGATION  ~ 1982  . X-STOP IMPLANTATION  ~ 2010   "lower back" (10/28/2012)    No current facility-administered medications for this visit.   Current Outpatient Medications  Medication Sig Dispense Refill  . enoxaparin (LOVENOX) 30 MG/0.3ML injection Inject 0.3 mLs (30 mg total) into the skin daily. 9 mL 0  . Multiple Vitamin (MULTIVITAMIN WITH MINERALS) TABS tablet Take 1 tablet by mouth daily. 60 tablet 0   Facility-Administered Medications Ordered in Other Visits  Medication Dose Route Frequency Provider Last Rate Last Admin  . 0.9 %  sodium chloride infusion (Manually program via Guardrails IV Fluids)   Intravenous Once Terrilee Croak, MD   Held at 10/19/19 1726  . acetaminophen (TYLENOL) tablet  500 mg  500 mg Oral Q6H PRN Dwyane Dee, MD      . cholecalciferol (VITAMIN D3) tablet 2,000 Units  2,000 Units Oral BID Terrilee Croak, MD   2,000 Units at 10/22/19 2135  . diltiazem (CARDIZEM CD) 24 hr capsule 240 mg  240 mg Oral Daily Ainsley Spinner, PA-C   240 mg at 10/22/19 8563  . docusate sodium (COLACE) capsule 100 mg  100 mg Oral BID Ainsley Spinner, PA-C   100 mg at 10/22/19 2135  . enoxaparin (LOVENOX) injection 30 mg  30 mg Subcutaneous Q24H Ainsley Spinner, PA-C   30 mg at 10/22/19 1497  . escitalopram (LEXAPRO) tablet 10 mg  10 mg Oral Daily Ainsley Spinner, PA-C   10 mg at 10/22/19 0263  . famotidine (PEPCID) tablet 20 mg  20 mg Oral Daily Ainsley Spinner, PA-C   20 mg at 10/22/19 7858  . fentaNYL (DURAGESIC) 12 MCG/HR 1 patch  1 patch Transdermal Q72H Ainsley Spinner, PA-C   1 patch at 10/21/19 [phone]  . ferrous sulfate tablet 325 mg  325 mg Oral BID WC Ainsley Spinner, PA-C    325 mg at 10/22/19 1732  . furosemide (LASIX) tablet 40 mg  40 mg Oral Daily Dahal, Marlowe Aschoff, MD   40 mg at 10/22/19 0820  . HYDROcodone-acetaminophen (NORCO) 10-325 MG per tablet 1 tablet  1 tablet Oral Q8H PRN Dwyane Dee, MD   1 tablet at 10/22/19 1835  . MEDLINE mouth rinse  15 mL Mouth Rinse BID Altamese [place], MD   15 mL at 10/22/19 2136  . menthol-cetylpyridinium (CEPACOL) lozenge 3 mg  1 lozenge Oral PRN Ainsley Spinner, PA-C       Or  . phenol (CHLORASEPTIC) mouth spray 1 spray  1 spray Mouth/Throat PRN Ainsley Spinner, PA-C      . metoCLOPramide (REGLAN) tablet 5-10 mg  5-10 mg Oral Q8H PRN Ainsley Spinner, PA-C       Or  . metoCLOPramide (REGLAN) injection 5-10 mg  5-10 mg Intravenous Q8H PRN Ainsley Spinner, PA-C      . metoprolol tartrate (LOPRESSOR) tablet 50 mg  50 mg Oral BID Ainsley Spinner, PA-C   50 mg at 10/22/19 2135  . multivitamin with minerals tablet 1 tablet  1 tablet Oral Daily Dahal, Marlowe Aschoff, MD   1 tablet at 10/22/19 0820  . ondansetron (ZOFRAN) tablet 4 mg  4 mg Oral Q6H PRN Ainsley Spinner, PA-C       Or  . ondansetron [facility]) injection 4 mg  4 mg Intravenous Q6H PRN Ainsley Spinner, PA-C      . pantoprazole (PROTONIX) EC tablet 40 mg  40 mg Oral Daily Dahal, Marlowe Aschoff, MD   40 mg at 10/22/19 0820  . potassium chloride (KLOR-CON) CR tablet 10 mEq  10 mEq Oral Daily Ainsley Spinner, PA-C   10 mEq at 10/22/19 0820  . predniSONE (DELTASONE) tablet 10 mg  10 mg Oral Q breakfast Ainsley Spinner, PA-C   10 mg at 10/22/19 7741  . senna-docusate (Senokot-S) tablet 1 tablet  1 tablet Oral QHS Ainsley Spinner, PA-C   1 tablet at 10/22/19 2136    Allergies  Allergen Reactions  . Dextromethorphan Rash    Social History   Socioeconomic History  . Marital status: Married    Spouse name: Not on file  . Number of children: 3  . Years of education: Not on file  . Highest education level: Not on file  Occupational History  . Occupation: Retired Cabin crew  Tobacco Use  .  Smoking status: Never Smoker  .  Smokeless tobacco: Never Used  Vaping Use  . Vaping Use: Never used  Substance and Sexual Activity  . Alcohol use: Yes    Comment: socially  . Drug use: No  . Sexual activity: Not on file  Other Topics Concern  . Not on file  Social History Narrative   Lives in [place] with her husband.  Retired Cabin crew.   Social Determinants of Health   Financial Resource Strain:   . Difficulty of Paying Living Expenses:   Food Insecurity:   . Worried About Charity fundraiser in the Last Year:   . Arboriculturist in the Last Year:   Transportation Needs:   . Film/video editor (Medical):   Marland Kitchen Lack of Transportation (Non-Medical):   Physical Activity:   . Days of Exercise per Week:   . Minutes of Exercise per Session:   Stress:   . Feeling of Stress :   Social Connections:   . Frequency of Communication with Friends and Family:   . Frequency of Social Gatherings with Friends and Family:   . Attends Religious Services:   . Active Member of Clubs or Organizations:   . Attends Archivist Meetings:   Marland Kitchen Marital Status:   Intimate Partner Violence:   . Fear of Current or Ex-Partner:   . Emotionally Abused:   Marland Kitchen Physically Abused:   . Sexually Abused:     Family History  Problem Relation Age of Onset  . Brain cancer Mother 8  . Heart attack Father   . Breast cancer Daughter 9  . Heart disease Other   . Hypertension Other   . Arthritis Neg Hx   . Colon cancer Neg Hx   . Osteoporosis Neg Hx     Review of Systems:  As stated in the HPI and otherwise negative.   There were no vitals taken for this visit.  Physical Examination:  General: Well developed, well nourished, NAD  HEENT: OP clear, mucus membranes moist  SKIN: warm, dry. No rashes. Neuro: No focal deficits  Musculoskeletal: Muscle strength 5/5 all ext  Psychiatric: Mood and affect normal  Neck: No JVD, no carotid bruits, no thyromegaly, no lymphadenopathy.  Lungs:Clear bilaterally, no wheezes, rhonci,  crackles Cardiovascular: Regular rate and rhythm. No murmurs, gallops or rubs. Abdomen:Soft. Bowel sounds present. Non-tender.  Extremities: No lower extremity edema. Pulses are 2 + in the bilateral DP/PT.  Cardiac cath 12/09/12: Ao: 150/61  LV: 155/13/18  RA: 6  RV: 25/5/8  PA: 24/10 (mean 16)  PCWP: 9  Fick Cardiac Output: 3.9 L/min  Fick Cardiac Index: 2.3 L/min/m2  Central Aortic Saturation: 98%  Pulmonary Artery Saturation: 69%  Angiographic Findings:  Left main: 10% distal stenosis.  Left Anterior Descending Artery: Large caliber vessel that courses to the apex. The proximal and mid vessel has moderate calcification. The mid vessel has a long 20% stenosis. There are two small caliber diagonal branches with mild plaque disease.  Circumflex Artery: Large caliber vessel with two small caliber obtuse marginal branches. No obstructive disease.  Right Coronary Artery: Large caliber, dominant vessel with a patent stent in the mid vessel. There is minimal stent restenosis. Just beyond the stent there is a 20% stenosis.  Left Ventricular Angiogram: LVEF=65%  Impression:  1. Stable single vessel CAD with patent mid RCA stent  2. Normal LV systolic function  3. Normal PA pressures  Echo January 2021:  1. Left ventricular ejection fraction, by  visual estimation, is 60 to  65%. The left ventricle has normal function. There is mildly increased  left ventricular hypertrophy.  2. Left ventricular diastolic parameters are consistent with Grade II  diastolic dysfunction (pseudonormalization). The mean left atrial pressure  is high.  3. The left ventricle has no regional wall motion abnormalities.  4. Global right ventricle has normal systolic function.The right  ventricular size is normal. No increase in right ventricular wall  thickness.  5. Left atrial size was mildly dilated.  6. Right atrial size was mildly dilated.  7. Mild mitral annular calcification.  8. The mitral valve  is normal in structure. Trivial mitral valve  regurgitation.  9. The tricuspid valve is normal in structure.  10. The tricuspid valve is normal in structure. Tricuspid valve  regurgitation is mild-moderate.  11. The aortic valve is normal in structure. Aortic valve regurgitation is  trivial.  12. The pulmonic valve was not well visualized. Pulmonic valve  regurgitation is not visualized.  13. Mildly elevated pulmonary artery systolic pressure.  14. The tricuspid regurgitant velocity is 2.18 m/s, and with an assumed  right atrial pressure of 15 mmHg, the estimated right ventricular systolic  pressure is mildly elevated at 34.0 mmHg.  15. The inferior vena cava is dilated in size with <50% respiratory  variability, suggesting right atrial pressure of 15 mmHg.  16. There is exaggerated respiratory displacement of the interventricular  septum. This could represent contrictive physiology , but can also be seen  with increased work of breathing (e.g. COPD or asthma exacerbation).  85. Compared to 03/09/2019, left ventricular wall motion and overall  systolic function have improved.   EKG:  EKG is ordered today. The ekg ordered today demonstrates  Recent Labs: 03/06/2019: TSH 3.085 06/01/2019: B Natriuretic Peptide 507.5 09/27/2019: ALT 19 10/22/2019: BUN 9; Creatinine, Ser 0.72; Hemoglobin 11.1; Magnesium 1.6; Platelets 222; Potassium 3.2; Sodium 138   Lipid Panel    Component Value Date/Time   CHOL 171 11/14/2018 1420   CHOL 155 08/18/2017 1241   TRIG 168.0 (H) 11/14/2018 1420   HDL 63.40 11/14/2018 1420   HDL 78 08/18/2017 1241   CHOLHDL 3 11/14/2018 1420   VLDL 33.6 11/14/2018 1420   LDLCALC 74 11/14/2018 1420   LDLCALC 51 08/18/2017 1241     Wt Readings from Last 3 Encounters:  10/17/19 150 lb (68 kg)  09/27/19 143 lb 15.4 oz (65.3 kg)  09/04/19 148 lb (67.1 kg)     Other studies Reviewed: Additional studies/ records that were reviewed today include: . Review of the  above records demonstrates:    Assessment and Plan:   1. CAD with stable angina: She has a Taxus drug eluting stent in the RCA, placed in 2005 in [place]. Cath at [facility] September 2014 with patent RCA stent and mild disease LAD and circumflex. Stress test in June 2019 without ischemia. No chest pain. Continue beta blocker. No ASA since she is on Eliquis. She has stopped her statin due to leg pains.   2. HTN: BP is controlled.   3. Hyperlipidemia: LDL near goal August 2020. Now off of statin due to leg pain and abdominal pain.   4. PAF: Sinus today. Continue Cardizem, Lopressor and Eliquis.    Will change Cardizem to Cardizem CD 180 mg daily.    5. Acute on chronic diastolic CHF: Weight is stable. Continue Lasix.    Current medicines are reviewed at length with the patient today.  The patient does not have concerns  regarding medicines.  The following changes have been made:  no change  Labs/ tests ordered today include:   No orders of the defined types were placed in this encounter.   Disposition:   FU with me in 6 months  Signed, [name], MD 10/23/2019 7:00 AM    [facility] [address], [place], [place]  [address] Phone: [phone]; Fax: [phone]

## 2019-10-23 NOTE — Progress Notes (Signed)
1650 Pt is A&O x3. Right hip and right outer thigh dressing dry and intact. Pain is controlled with oral narcotics. Discharged pt to SNF via [facility]. Report was given to [employer] at [facility]. Pt's belongings cell phone w/ charger and 2 eye glasses placed in a bag with pt.

## 2019-10-23 NOTE — Consult Note (Signed)
   [facility] CM Inpatient Consult   10/23/2019  Betty Jackson 08-31-1934 [id number]   Patient chart has been reviewed for readmissions less than 30 days and for high risk score, 49%, for unplanned readmissions.  Patient assessed for community [facility] follow up needs.  Chart review reveals patient current disposition plan is for skilled nursing facility. No [facility] CM needs at this time.  Netta Cedars, MSN, [facility] Liaison Nurse Mobile Phone [phone]  Toll free office [phone]

## 2019-10-23 NOTE — Progress Notes (Signed)
PROGRESS NOTE    Betty Jackson   XBD:[id number]  DOB: April 18, 1934  DOA: 10/17/2019     6  PCP: Eulas Post, MD  CC: fall  Hospital Course: Betty Jackson is a 85 y.o. female with PMH significant for HTN, HLD, CAD, paroxysmal A. fib not on anticoagulation because of falls, CVA, chronic diastolic CHF, CKD, chronic anemia, chronic low back pain, rheumatoid arthritis, giant cell arteritis, diverticulosis, GERD, hemorrhoids, anxiety. She also had multiple recent hospitalizations for various problems including C. Difficile, diverticulitis, GI bleed, hand cellulitis.  Patient was recently admitted earlier this month (6/28-7/2) after mechanical fall and distal R femur fx. She was supposed to be NWB 6-8 weeks on the R femur, was in SNF for rehab. On 7/20, patient fell out of bed while trying to turn off her light.  She complained severe pain on the right femur/knee area and was hence brought to the ED.   Imaging in the ED noted right distal femur is fractured again at site of prior repair along with large joint effusion. Orthopedic surgeon Dr. Marcelino Scot was notified by EDP. She underwent repeat ORIF on 10/17/19.  Her Hgb showed signs of downtrending and on 7/22 had down trended to 6.5 g/dL.  She was given a total of 2 units PRBC with good response.  The following morning hemoglobin had improved to 10.5 g/dL on 10/20/2019. With further monitoring, and remained stable.  She was to be discharged back to SNF however denied by insurance. After peer to peer on 7/26, the recommendation was for LT care until she was able to be more weight bearing (currently NWB 4-6 weeks on RLE).    Interval History:  No acute events overnight. Feeling okay this am. Had to do a peer to peer with insurance company this am. Unfortunately due to her NWB status for 4-6 weeks she is denied for SNF and is told she needs long term placement. Patient does not have medicaid so payment may possibly be private pay which will be difficult  for family. Discussed with SW/CM who will look into further options.   Old records reviewed in assessment of this patient  ROS: Constitutional: negative for chills and fevers, Respiratory: negative for cough, Cardiovascular: negative for chest pain and Gastrointestinal: negative for abdominal pain  Assessment & Plan: Closed fracture of right distal femur ([place]) 7/20, patient underwent ORIF. -Currently pain controlled on fentanyl patch and Norco as needed. - NWB 4-6 weeks per ortho; patient unable to go back to SNF per insurance; will pursue long term care placement with transition back to SNF once patient more weight bearing and mobile again  -Lovenox for DVT prophylaxis. - monitor Hgb  Major depressive disorder, recurrent episode, moderate (HCC) -Continue Lexapro  PAF (paroxysmal atrial fibrillation) (HCC) -Continue Lovenox and Cardizem -Was taken off of Eliquis by cardiology due to her increasing fall risk  HTN (hypertension) -Continue Cardizem  AKI (acute kidney injury) ([place]) Creatinine elevated 1.3 on admission.  Baseline less than 1. -Normalized with IV fluids  Pressure injury of skin - continue routine turing and working with PT to get OOB  Giant cell arteritis ([place]) On chronic prednisone 10mg currently for a slow dose taper -Continue prednisone 10 mg daily. -Start Protonix while on steroids and Lovenox.   Antimicrobials: N/A  DVT prophylaxis: Lovenox Code Status: Full Family Communication: None present Disposition Plan:  Status is: Inpatient  Remains inpatient appropriate because:Unsafe d/c plan and Inpatient level of care appropriate due to severity of illness  Dispo: The patient is from: SNF              Anticipated d/c is to: unknown after insurance denial; pending placement now              Anticipated d/c date is: 2 days              Patient currently is not medically stable to d/c.  Objective: Blood pressure (!) 154/60, pulse 87, temperature 98.4  F (36.9 C), temperature source Oral, resp. rate 16, height 5\' 4"  (1.626 m), weight 68 kg, SpO2 98 %.  Examination: General appearance: alert, cooperative and no distress Head: Normocephalic, without obvious abnormality Eyes: EOMI Lungs: clear to auscultation bilaterally Heart: regular rate and rhythm and S1, S2 normal Abdomen: normal findings: bowel sounds normal and soft, non-tender Extremities: No edema. Surgical dressing in place with no surrounding oozing or weeping. Compartments are soft. Skin: mobility and turgor normal Neurologic: Grossly normal   Consultants:   Ortho  Data Reviewed: I have personally reviewed following labs and imaging studies Results for orders placed or performed during the hospital encounter of 10/17/19 (from the past 24 hour(s))  SARS CORONAVIRUS 2 (TAT 6-24 HRS) Nasopharyngeal Nasopharyngeal Swab     Status: None   Collection Time: 10/23/19  3:42 AM   Specimen: Nasopharyngeal Swab  Result Value Ref Range   SARS Coronavirus 2 NEGATIVE NEGATIVE    Recent Results (from the past 240 hour(s))  SARS Coronavirus 2 by RT PCR (hospital order, performed in [facility] hospital lab) Nasopharyngeal Nasopharyngeal Swab     Status: None   Collection Time: 10/17/19  2:17 AM   Specimen: Nasopharyngeal Swab  Result Value Ref Range Status   SARS Coronavirus 2 NEGATIVE NEGATIVE Final    Comment: (NOTE) SARS-CoV-2 target nucleic acids are NOT DETECTED.  The SARS-CoV-2 RNA is generally detectable in upper and lower respiratory specimens during the acute phase of infection. The lowest concentration of SARS-CoV-2 viral copies this assay can detect is 250 copies / mL. A negative result does not preclude SARS-CoV-2 infection and should not be used as the sole basis for treatment or other patient management decisions.  A negative result may occur with improper specimen collection / handling, submission of specimen other than nasopharyngeal swab, presence of viral  mutation(s) within the areas targeted by this assay, and inadequate number of viral copies (<250 copies / mL). A negative result must be combined with clinical observations, patient history, and epidemiological information.  Fact Sheet for Patients:   StrictlyIdeas.no  Fact Sheet for Healthcare Providers: BankingDealers.co.za  This test is not yet approved or  cleared by the [place] FDA and has been authorized for detection and/or diagnosis of SARS-CoV-2 by FDA under an Emergency Use Authorization (EUA).  This EUA will remain in effect (meaning this test can be used) for the duration of the COVID-19 declaration under Section 564(b)(1) of the Act, 21 U.S.C. section 360bbb-3(b)(1), unless the authorization is terminated or revoked sooner.  Performed at [facility], [place] [address] [place], [place] [address]   Surgical pcr screen     Status: None   Collection Time: 10/17/19  1:20 PM   Specimen: Nasal Mucosa; Nasal Swab  Result Value Ref Range Status   MRSA, PCR NEGATIVE NEGATIVE Final   Staphylococcus aureus NEGATIVE NEGATIVE Final    Comment: (NOTE) The Xpert SA Assay (FDA approved for NASAL specimens in patients 69 years of age and older), is one component of a  comprehensive surveillance program. It is not intended to diagnose infection nor to guide or monitor treatment. Performed at [facility], [place] [address], [place], [place] [address]   SARS CORONAVIRUS 2 (TAT 6-24 HRS) Nasopharyngeal Nasopharyngeal Swab     Status: None   Collection Time: 10/23/19  3:42 AM   Specimen: Nasopharyngeal Swab  Result Value Ref Range Status   SARS Coronavirus 2 NEGATIVE NEGATIVE Final    Comment: (NOTE) SARS-CoV-2 target nucleic acids are NOT DETECTED.  The SARS-CoV-2 RNA is generally detectable in upper and lower respiratory specimens during the acute phase of infection. Negative results do not preclude SARS-CoV-2  infection, do not rule out co-infections with other pathogens, and should not be used as the sole basis for treatment or other patient management decisions. Negative results must be combined with clinical observations, patient history, and epidemiological information. The expected result is Negative.  Fact Sheet for Patients: SugarRoll.be  Fact Sheet for Healthcare Providers: https://www.woods-mathews.com/  This test is not yet approved or cleared by the [place] FDA and  has been authorized for detection and/or diagnosis of SARS-CoV-2 by FDA under an Emergency Use Authorization (EUA). This EUA will remain  in effect (meaning this test can be used) for the duration of the COVID-19 declaration under Se ction 564(b)(1) of the Act, 21 U.S.C. section 360bbb-3(b)(1), unless the authorization is terminated or revoked sooner.  Performed at [facility], [place] [employer] [address] [place], [place] [address]      Radiology Studies: No results found. DG Knee Right Port  Final Result    DG C-Arm 1-60 Min  Final Result    DG FEMUR, MIN 2 VIEWS RIGHT  Final Result    Chest Portable 1 View  Final Result    CT Head Wo Contrast  Final Result    CT Cervical Spine Wo Contrast  Final Result    DG Femur Min 2 Views Right  Final Result    DG Knee Complete 4 Views Right  Final Result    DG Hip Unilat W or Wo Pelvis 2-3 Views Right  Final Result    DG Hip Unilat W or Wo Pelvis 2-3 Views Left  Final Result       Scheduled Meds: . sodium chloride   Intravenous Once  . cholecalciferol  2,000 Units Oral BID  . diltiazem  240 mg Oral Daily  . docusate sodium  100 mg Oral BID  . enoxaparin (LOVENOX) injection  30 mg Subcutaneous Q24H  . escitalopram  10 mg Oral Daily  . famotidine  20 mg Oral Daily  . fentaNYL  1 patch Transdermal Q72H  . ferrous sulfate  325 mg Oral BID WC  . furosemide  40 mg Oral Daily  . mouth rinse  15 mL Mouth  Rinse BID  . metoprolol tartrate  50 mg Oral BID  . multivitamin with minerals  1 tablet Oral Daily  . pantoprazole  40 mg Oral Daily  . potassium chloride  10 mEq Oral Daily  . predniSONE  10 mg Oral Q breakfast  . senna-docusate  1 tablet Oral QHS   PRN Meds: acetaminophen, HYDROcodone-acetaminophen, menthol-cetylpyridinium **OR** phenol, metoCLOPramide **OR** metoCLOPramide (REGLAN) injection, ondansetron **OR** ondansetron (ZOFRAN) IV Continuous Infusions:    LOS: 6 days  Time spent: Greater than 50% of the 35 minute visit was spent in counseling/coordination of care for the patient as laid out in the A&P.   Dwyane Dee, MD [facility] 10/23/2019, 9:38 AM  Contact via secure chat.  To contact the attending provider between 7A-7P or the covering provider during after hours 7P-7A, please log into the web site [email or web address] and access using universal [place] password for that web site. If you do not have the password, please call the hospital operator.

## 2019-10-23 NOTE — Discharge Summary (Signed)
Physician Discharge Summary  Betty Jackson:[id number] DOB: [birth year] DOA: 10/17/2019  PCP: Eulas Post, MD  Admit date: 10/17/2019 Discharge date: 10/23/2019  Admitted From: SNF Disposition:  SNF Discharging physician: Dwyane Dee, MD  Recommendations for Outpatient Follow-up:  1. Follow up with ortho  Patient discharged to SNF in Discharge Condition: stable CODE STATUS: DNR Diet recommendation:  Diet Orders (From admission, onward)    Start     Ordered   10/23/19 0000  Diet - low sodium heart healthy        10/23/19 1140   10/17/19 2038  Diet regular Room service appropriate? Yes; Fluid consistency: Thin  Diet effective now       Question Answer Comment  Room service appropriate? Yes   Fluid consistency: Thin      10/17/19 2037          Hospital Course: Betty Jackson is a 85 y.o. female with PMH significant for HTN, HLD, CAD, paroxysmal A. fib not on anticoagulation because of falls, CVA, chronic diastolic CHF, CKD, chronic anemia, chronic low back pain, rheumatoid arthritis, giant cell arteritis, diverticulosis, GERD, hemorrhoids, anxiety. She also had multiple recent hospitalizations for various problems including C. Difficile, diverticulitis, GI bleed, hand cellulitis.  Patient was recently admitted earlier this month (6/28-7/2) after mechanical fall and distal R femur fx. She was supposed to be NWB 6-8 weeks on the R femur, was in SNF for rehab. On 7/20, patient fell out of bed while trying to turn off her light.  She complained severe pain on the right femur/knee area and was hence brought to the ED.   Imaging in the ED noted right distal femur is fractured again at site of prior repair along with large joint effusion. Orthopedic surgeon Dr. Marcelino Scot was notified by EDP. She underwent repeat ORIF on 10/17/19.  Her Hgb showed signs of downtrending and on 7/22 had down trended to 6.5 g/dL.  She was given a total of 2 units PRBC with good response.  The following morning  hemoglobin had improved to 10.5 g/dL on 10/20/2019. With further monitoring, and remained stable.  She was to be discharged back to SNF however denied by insurance. After peer to peer on 7/26, the recommendation was for LT care until she was able to be more weight bearing (currently NWB 4-6 weeks on RLE).  After family discussion, patient and family have elected to return to her prior SNF.    Closed fracture of right distal femur ([place]) 7/20, patient underwent ORIF. -Currently pain controlled on fentanyl patch and Norco as needed. - NWB 4-6 weeks per ortho; patient unable to go back to SNF per insurance; will pursue long term care placement with transition back to SNF once patient more weight bearing and mobile again  -Lovenox for DVT prophylaxis. - monitor Hgb  Major depressive disorder, recurrent episode, moderate (HCC) -Continue Lexapro  PAF (paroxysmal atrial fibrillation) (HCC) -Continue Lovenox and Cardizem -Was taken off of Eliquis by cardiology due to her increasing fall risk  HTN (hypertension) -Continue Cardizem  AKI (acute kidney injury) ([place]) Creatinine elevated 1.3 on admission.  Baseline less than 1. -Normalized with IV fluids  Pressure injury of skin - continue routine turing and working with PT to get OOB  Giant cell arteritis ([place]) On chronic prednisone 10mg currently for a slow dose taper -Continue prednisone 10 mg daily. -Start Protonix while on steroids and Lovenox.    The patient's chronic medical conditions were treated accordingly per the patient's home medication  regimen except as noted.  On day of discharge, patient was felt deemed stable for discharge. Patient/family member advised to call PCP or come back to ER if needed.   Discharge Diagnoses:   Principal Diagnosis: Closed fracture of right distal femur [facility])  Active Hospital Problems   Diagnosis Date Noted  . Closed fracture of right distal femur ([place]) 10/17/2019    Priority: High  . Pressure  injury of skin 10/18/2019  . Chronic diastolic CHF (congestive heart failure) ([place]) 09/25/2019  . Giant cell arteritis ([place]) 05/20/2017  . HTN (hypertension) 05/20/2017  . PAF (paroxysmal atrial fibrillation) ([place]) 05/20/2017  . Major depressive disorder, recurrent episode, moderate ([place]) 07/16/2013    Resolved Hospital Problems   Diagnosis Date Noted Date Resolved  . AKI (acute kidney injury) ([place]) 01/13/2019 10/20/2019    Discharge Instructions    Diet - low sodium heart healthy   Complete by: As directed    Increase activity slowly   Complete by: As directed    Leave dressing on - Keep it clean, dry, and intact until clinic visit   Complete by: As directed      Allergies as of 10/23/2019      Reactions   Dextromethorphan Rash      Medication List    TAKE these medications   ALPRAZolam 0.5 MG tablet Commonly known as: XANAX Take 1 tablet (0.5 mg total) by mouth at bedtime as needed for sleep.   buPROPion 150 MG 12 hr tablet Commonly known as: WELLBUTRIN SR Take 150 mg by mouth at bedtime.   diltiazem 240 MG 24 hr capsule Commonly known as: CARDIZEM CD Take 1 capsule (240 mg total) by mouth daily.   enoxaparin 30 MG/0.3ML injection Commonly known as: LOVENOX Inject 0.3 mLs (30 mg total) into the skin daily.   escitalopram 10 MG tablet Commonly known as: LEXAPRO TAKE 1 TABLET BY MOUTH EVERY DAY   famotidine 20 MG tablet Commonly known as: PEPCID Take 1 tablet (20 mg total) by mouth daily.   fentaNYL 12 MCG/HR Commonly known as: [place] 1 patch onto the skin every 3 (three) days.   ferrous sulfate 325 (65 FE) MG tablet Take 1 tablet (325 mg total) by mouth 2 (two) times daily with a meal.   furosemide 40 MG tablet Commonly known as: LASIX Take 1 tablet (40 mg total) by mouth daily.   HYDROcodone-acetaminophen 10-325 MG tablet Commonly known as: NORCO Take one tablet by mouth every 8 hours as needed for breakthrough pain.   LACTOBACILLUS  PO Take 2 capsules by mouth 3 (three) times daily.   metoprolol tartrate 50 MG tablet Commonly known as: LOPRESSOR Take 50 mg by mouth 2 (two) times daily.   multivitamin with minerals Tabs tablet Take 1 tablet by mouth daily.   potassium chloride 10 MEQ tablet Commonly known as: KLOR-CON Take 1 tablet (10 mEq total) by mouth daily.   predniSONE 10 MG tablet Commonly known as: DELTASONE Take 1 tablet (10 mg total) by mouth daily with breakfast.   senna-docusate 8.6-50 MG tablet Commonly known as: Senokot-S Take 1 tablet by mouth at bedtime.   vitamin C 500 MG tablet Commonly known as: ASCORBIC ACID Take 1,000 mg by mouth daily.   Vitamin D3 25 MCG tablet Commonly known as: Vitamin D Take 2 tablets (2,000 Units total) by mouth 2 (two) times daily.   vitamin E 180 MG (400 UNITS) capsule Generic drug: vitamin E Take 400 Units by mouth daily.  Discharge Care Instructions  (From admission, onward)         Start     Ordered   10/23/19 0000  Leave dressing on - Keep it clean, dry, and intact until clinic visit        10/23/19 1140          Follow-up Information    Altamese Fairford, MD. Schedule an appointment as soon as possible for a visit in 70 day(s).   Specialty: Orthopedic Surgery Contact information: [place] [address] [phone]              Allergies  Allergen Reactions  . Dextromethorphan Rash   Discharge Exam: BP (!) 154/60 (BP Location: Left Arm)   Pulse 87   Temp 98.4 F (36.9 C) (Oral)   Resp 16   Ht 5\' 4"  (1.626 m)   Wt 68 kg   SpO2 98%   BMI 25.75 kg/m  General appearance: alert, cooperative and no distress Head: Normocephalic, without obvious abnormality Eyes: EOMI Lungs: clear to auscultation bilaterally Heart: regular rate and rhythm and S1, S2 normal Abdomen: normal findings: bowel sounds normal and soft, non-tender Extremities: No edema. Surgical dressing in place with no surrounding  oozing or weeping. Compartments are soft. Skin: mobility and turgor normal Neurologic: Grossly normal   The results of significant diagnostics from this hospitalization (including imaging, microbiology, ancillary and laboratory) are listed below for reference.   Microbiology: Recent Results (from the past 240 hour(s))  SARS Coronavirus 2 by RT PCR (hospital order, performed in [facility] hospital lab) Nasopharyngeal Nasopharyngeal Swab     Status: None   Collection Time: 10/17/19  2:17 AM   Specimen: Nasopharyngeal Swab  Result Value Ref Range Status   SARS Coronavirus 2 NEGATIVE NEGATIVE Final    Comment: (NOTE) SARS-CoV-2 target nucleic acids are NOT DETECTED.  The SARS-CoV-2 RNA is generally detectable in upper and lower respiratory specimens during the acute phase of infection. The lowest concentration of SARS-CoV-2 viral copies this assay can detect is 250 copies / mL. A negative result does not preclude SARS-CoV-2 infection and should not be used as the sole basis for treatment or other patient management decisions.  A negative result may occur with improper specimen collection / handling, submission of specimen other than nasopharyngeal swab, presence of viral mutation(s) within the areas targeted by this assay, and inadequate number of viral copies (<250 copies / mL). A negative result must be combined with clinical observations, patient history, and epidemiological information.  Fact Sheet for Patients:   StrictlyIdeas.no  Fact Sheet for Healthcare Providers: BankingDealers.co.za  This test is not yet approved or  cleared by the [place] FDA and has been authorized for detection and/or diagnosis of SARS-CoV-2 by FDA under an Emergency Use Authorization (EUA).  This EUA will remain in effect (meaning this test can be used) for the duration of the COVID-19 declaration under Section 564(b)(1) of the Act, 21 U.S.C. section  360bbb-3(b)(1), unless the authorization is terminated or revoked sooner.  Performed at [facility], [place] [address], [place], [place] [address]   Surgical pcr screen     Status: None   Collection Time: 10/17/19  1:20 PM   Specimen: Nasal Mucosa; Nasal Swab  Result Value Ref Range Status   MRSA, PCR NEGATIVE NEGATIVE Final   Staphylococcus aureus NEGATIVE NEGATIVE Final    Comment: (NOTE) The Xpert SA Assay (FDA approved for NASAL specimens in patients 62 years of age and  older), is one component of a comprehensive surveillance program. It is not intended to diagnose infection nor to guide or monitor treatment. Performed at [facility], [place] [address], [place], [place] [address]   SARS CORONAVIRUS 2 (TAT 6-24 HRS) Nasopharyngeal Nasopharyngeal Swab     Status: None   Collection Time: 10/23/19  3:42 AM   Specimen: Nasopharyngeal Swab  Result Value Ref Range Status   SARS Coronavirus 2 NEGATIVE NEGATIVE Final    Comment: (NOTE) SARS-CoV-2 target nucleic acids are NOT DETECTED.  The SARS-CoV-2 RNA is generally detectable in upper and lower respiratory specimens during the acute phase of infection. Negative results do not preclude SARS-CoV-2 infection, do not rule out co-infections with other pathogens, and should not be used as the sole basis for treatment or other patient management decisions. Negative results must be combined with clinical observations, patient history, and epidemiological information. The expected result is Negative.  Fact Sheet for Patients: SugarRoll.be  Fact Sheet for Healthcare Providers: https://www.woods-mathews.com/  This test is not yet approved or cleared by the [place] FDA and  has been authorized for detection and/or diagnosis of SARS-CoV-2 by FDA under an Emergency Use Authorization (EUA). This EUA will remain  in effect (meaning this test can be used) for the duration of  the COVID-19 declaration under Se ction 564(b)(1) of the Act, 21 U.S.C. section 360bbb-3(b)(1), unless the authorization is terminated or revoked sooner.  Performed at [facility], [place] [address] [place], [place] [address]      Labs: BNP (last 3 results) Recent Labs    05/30/19 0404 05/31/19 0455 06/01/19 0322  BNP 286.1* 506.7* 694.8*   Basic Metabolic Panel: Recent Labs  Lab 10/17/19 0212 10/17/19 2159 10/18/19 0310 10/21/19 0311 10/22/19 0302  NA 139  --  140 138 138  K 4.3  --  4.5 3.4* 3.2*  CL 99  --  104 99 98  CO2  --   --  28 31 30   GLUCOSE 96  --  157* 95 99  BUN 39*  --  13 10 9   CREATININE 1.30* 0.84 0.79 0.75 0.72  CALCIUM  --   --  8.3* 8.5* 8.7*  MG  --   --   --  1.7 1.6*   Liver Function Tests: No results for input(s): AST, ALT, ALKPHOS, BILITOT, PROT, ALBUMIN in the last 168 hours. No results for input(s): LIPASE, AMYLASE in the last 168 hours. No results for input(s): AMMONIA in the last 168 hours. CBC: Recent Labs  Lab 10/17/19 0152 10/17/19 0212 10/18/19 0310 10/18/19 0310 10/19/19 0210 10/19/19 1204 10/20/19 0156 10/21/19 0311 10/22/19 0302  WBC 11.3*   < > 10.7*  --  12.1*  --  12.4* 7.9 9.4  NEUTROABS 8.0*  --  9.6*  --   --   --   --  5.1 6.2  HGB 11.0*   < > 8.1*   < > 6.4* 6.5* 10.5* 10.4* 11.1*  HCT 35.6*   < > 26.1*   < > 21.0* 21.1* 32.5* 32.2* 34.4*  MCV 94.4   < > 95.6  --  95.9  --  90.5 92.0 91.0  PLT 239   < > 174  --  150  --  194 194 222   < > = values in this interval not displayed.   Cardiac Enzymes: No results for input(s): CKTOTAL, CKMB, CKMBINDEX, TROPONINI in the last 168 hours. BNP: Invalid input(s): POCBNP CBG: No results for  input(s): GLUCAP in the last 168 hours. D-Dimer No results for input(s): DDIMER in the last 72 hours. Hgb A1c No results for input(s): HGBA1C in the last 72 hours. Lipid Profile No results for input(s): CHOL, HDL, LDLCALC, TRIG, CHOLHDL, LDLDIRECT in the last 72  hours. Thyroid function studies No results for input(s): TSH, T4TOTAL, T3FREE, THYROIDAB in the last 72 hours.  Invalid input(s): FREET3 Anemia work up No results for input(s): VITAMINB12, FOLATE, FERRITIN, TIBC, IRON, RETICCTPCT in the last 72 hours. Urinalysis    Component Value Date/Time   COLORURINE STRAW (A) 09/25/2019 1636   APPEARANCEUR CLEAR 09/25/2019 1636   LABSPEC 1.015 09/25/2019 1636   PHURINE 7.0 09/25/2019 1636   GLUCOSEU NEGATIVE 09/25/2019 1636   GLUCOSEU NEGATIVE 07/31/2016 1018   HGBUR MODERATE (A) 09/25/2019 1636   BILIRUBINUR NEGATIVE 09/25/2019 1636   BILIRUBINUR neg 07/31/2016 1012   KETONESUR NEGATIVE 09/25/2019 1636   PROTEINUR NEGATIVE 09/25/2019 1636   UROBILINOGEN 0.2 07/31/2016 1018   UROBILINOGEN 0.2 07/31/2016 1012   NITRITE NEGATIVE 09/25/2019 1636   LEUKOCYTESUR NEGATIVE 09/25/2019 1636   Sepsis Labs Invalid input(s): PROCALCITONIN,  WBC,  LACTICIDVEN Microbiology Recent Results (from the past 240 hour(s))  SARS Coronavirus 2 by RT PCR (hospital order, performed in [facility] hospital lab) Nasopharyngeal Nasopharyngeal Swab     Status: None   Collection Time: 10/17/19  2:17 AM   Specimen: Nasopharyngeal Swab  Result Value Ref Range Status   SARS Coronavirus 2 NEGATIVE NEGATIVE Final    Comment: (NOTE) SARS-CoV-2 target nucleic acids are NOT DETECTED.  The SARS-CoV-2 RNA is generally detectable in upper and lower respiratory specimens during the acute phase of infection. The lowest concentration of SARS-CoV-2 viral copies this assay can detect is 250 copies / mL. A negative result does not preclude SARS-CoV-2 infection and should not be used as the sole basis for treatment or other patient management decisions.  A negative result may occur with improper specimen collection / handling, submission of specimen other than nasopharyngeal swab, presence of viral mutation(s) within the areas targeted by this assay, and inadequate number of viral  copies (<250 copies / mL). A negative result must be combined with clinical observations, patient history, and epidemiological information.  Fact Sheet for Patients:   StrictlyIdeas.no  Fact Sheet for Healthcare Providers: BankingDealers.co.za  This test is not yet approved or  cleared by the [place] FDA and has been authorized for detection and/or diagnosis of SARS-CoV-2 by FDA under an Emergency Use Authorization (EUA).  This EUA will remain in effect (meaning this test can be used) for the duration of the COVID-19 declaration under Section 564(b)(1) of the Act, 21 U.S.C. section 360bbb-3(b)(1), unless the authorization is terminated or revoked sooner.  Performed at [facility], [place] [address], [place], [place] [address]   Surgical pcr screen     Status: None   Collection Time: 10/17/19  1:20 PM   Specimen: Nasal Mucosa; Nasal Swab  Result Value Ref Range Status   MRSA, PCR NEGATIVE NEGATIVE Final   Staphylococcus aureus NEGATIVE NEGATIVE Final    Comment: (NOTE) The Xpert SA Assay (FDA approved for NASAL specimens in patients 84 years of age and older), is one component of a comprehensive surveillance program. It is not intended to diagnose infection nor to guide or monitor treatment. Performed at [facility], [place] [address], [place], [place] [address]   SARS CORONAVIRUS 2 (TAT 6-24 HRS) Nasopharyngeal Nasopharyngeal Swab     Status: None  Collection Time: 10/23/19  3:42 AM   Specimen: Nasopharyngeal Swab  Result Value Ref Range Status   SARS Coronavirus 2 NEGATIVE NEGATIVE Final    Comment: (NOTE) SARS-CoV-2 target nucleic acids are NOT DETECTED.  The SARS-CoV-2 RNA is generally detectable in upper and lower respiratory specimens during the acute phase of infection. Negative results do not preclude SARS-CoV-2 infection, do not rule out co-infections with other pathogens, and should not be used as  the sole basis for treatment or other patient management decisions. Negative results must be combined with clinical observations, patient history, and epidemiological information. The expected result is Negative.  Fact Sheet for Patients: SugarRoll.be  Fact Sheet for Healthcare Providers: https://www.woods-mathews.com/  This test is not yet approved or cleared by the [place] FDA and  has been authorized for detection and/or diagnosis of SARS-CoV-2 by FDA under an Emergency Use Authorization (EUA). This EUA will remain  in effect (meaning this test can be used) for the duration of the COVID-19 declaration under Se ction 564(b)(1) of the Act, 21 U.S.C. section 360bbb-3(b)(1), unless the authorization is terminated or revoked sooner.  Performed at [facility], [place] [address], [place], [place] [address]     Procedures/Studies: CT Head Wo Contrast  Result Date: 10/17/2019 CLINICAL DATA:  Status post fall. EXAM: CT HEAD WITHOUT CONTRAST TECHNIQUE: Contiguous axial images were obtained from the base of the skull through the vertex without intravenous contrast. COMPARISON:  June 20, 2019 FINDINGS: Brain: There is mild cerebral atrophy with widening of the extra-axial spaces and ventricular dilatation. There are areas of decreased attenuation within the white matter tracts of the supratentorial brain, consistent with microvascular disease changes. Vascular: No hyperdense vessel or unexpected calcification. Skull: Normal. Negative for fracture or focal lesion. Sinuses/Orbits: No acute finding. Other: None. IMPRESSION: 1. Generalized cerebral atrophy. 2. No acute intracranial abnormality. Electronically Signed   By: Virgina Norfolk M.D.   On: 10/17/2019 03:05   CT Cervical Spine Wo Contrast  Result Date: 10/17/2019 CLINICAL DATA:  Status post fall. EXAM: CT CERVICAL SPINE WITHOUT CONTRAST TECHNIQUE: Multidetector CT imaging of the cervical  spine was performed without intravenous contrast. Multiplanar CT image reconstructions were also generated. COMPARISON:  None. FINDINGS: Alignment: There is approximately 1 mm anterolisthesis of the C3 vertebral body on C4. 2 mm to 3 mm anterolisthesis of the C4 vertebral body on C5 is noted. Skull base and vertebrae: No acute fracture. No primary bone lesion or focal pathologic process. Soft tissues and spinal canal: No prevertebral fluid or swelling. No visible canal hematoma. Disc levels: Moderate to marked severity endplate sclerosis is seen at the levels of C5-C6 and C6-C7. Moderate to marked severity intervertebral disc space narrowing is also seen at these levels. Marked severity posterior intervertebral disc space narrowing is seen at the level of C3-C4. Marked severity bilateral multilevel facet joint hypertrophy is noted. Upper chest: Negative. Other: None. IMPRESSION: 1. No acute fracture within the cervical spine. 2. Marked severity bilateral multilevel facet joint hypertrophy. 3. Moderate to marked severity degenerative changes at the levels of C5-C6 and C6-C7. 4. 2 mm to 3 mm anterolisthesis of the C3 vertebral body on C4 and C4 vertebral body on C5. Electronically Signed   By: Virgina Norfolk M.D.   On: 10/17/2019 03:07   CT Knee Right Wo Contrast  Result Date: 09/25/2019 CLINICAL DATA:  Right knee pain and swelling since a fall yesterday. Distal femur fracture. EXAM: CT OF THE RIGHT KNEE WITHOUT CONTRAST TECHNIQUE: Multidetector CT  imaging of the right knee was performed according to the standard protocol. Multiplanar CT image reconstructions were also generated. COMPARISON:  Radiographs dated 09/25/2019 FINDINGS: Bones/Joint/Cartilage There is an oblique fracture through the medial aspect of the distal right femoral metaphysis extending into the intercondylar notch. On the axial and coronal images there appears to be a sagittal fracture involving the periphery of the posterior aspect of  femoral condyle but this is not definitive. No other fractures. Hemarthrosis. Tricompartmental osteoarthritis. Ganglion cyst extending along the popliteus tendon containing a small calcified loose body. Ligaments Posterior cruciate ligament and collateral ligaments are intact. Anterior cruciate ligament is not well enough seen for assessment. Muscles and Tendons Negative. Soft tissues Subcutaneous edema or hemorrhage around the knee to the expected degree. No definable hematoma. IMPRESSION: 1. Oblique fracture through the medial aspect of the distal right femoral metaphysis extending into the intercondylar notch. 2. Possible sagittal fracture involving the periphery of the posterior aspect of the femoral condyle but this is not definitive. 3. Hemarthrosis. 4. Tricompartmental osteoarthritis. 5. Ganglion cyst extending along the popliteus tendon containing a small calcified loose body. Electronically Signed   By: [name] M.D.   On: 09/25/2019 14:39   CT ABDOMEN PELVIS W CONTRAST  Result Date: 09/25/2019 CLINICAL DATA:  Fall EXAM: CT ABDOMEN AND PELVIS WITH CONTRAST TECHNIQUE: Multidetector CT imaging of the abdomen and pelvis was performed using the standard protocol following bolus administration of intravenous contrast. CONTRAST:  160mL OMNIPAQUE IOHEXOL 300 MG/ML  SOLN COMPARISON:  CT 09/04/2019, 05/28/2019, 04/15/2019, 03/06/2019 FINDINGS: Lower chest: Lung bases demonstrate no acute consolidation or effusion. Mild cardiomegaly. Coronary vascular calcification. Hepatobiliary: Status post cholecystectomy. Mild intra and extrahepatic biliary dilatation as before. No focal hepatic abnormality Pancreas: Choose Spleen: Normal in size without focal abnormality. Adrenals/Urinary Tract: Adrenal glands are normal. Subcentimeter hypodensities in the left kidney too small to further characterize. No hydronephrosis. Bladder is slightly thick walled anteriorly. Stomach/Bowel: Stomach nonenlarged. No dilated small  bowel. Negative appendix. Colon diverticular disease without acute inflammatory change. Vascular/Lymphatic: Moderate aortic atherosclerosis without aneurysm. No suspicious adenopathy. Reproductive: Status post hysterectomy. No adnexal masses. Other: Negative for free air or free fluid. Small fat containing umbilical hernia. Musculoskeletal: Status post right hip replacement with artifact. Sclerosis and periosteal reaction involving the left inferior pubic ramus and ischial tuberosity corresponding to history of chronic fractures. Postsurgical changes of the lumbar spine L2 through L4. Post augmentation changes at T11 and L1. No definite acute osseous abnormality. IMPRESSION: 1. No CT evidence for acute intra-abdominal or pelvic abnormality. 2. Colon diverticular disease without acute inflammatory change. Aortic Atherosclerosis (ICD10-I70.0). Electronically Signed   By: Donavan Foil M.D.   On: 09/25/2019 17:36   Chest Portable 1 View  Result Date: 10/17/2019 CLINICAL DATA:  Preoperative evaluation. EXAM: PORTABLE CHEST 1 VIEW COMPARISON:  September 25, 2019 FINDINGS: Mild, diffuse chronic appearing increased lung markings are seen without evidence of acute infiltrate, pleural effusion or pneumothorax. A radiopaque surgical suture is seen overlying the medial aspect of the left lung base. This is present on the prior exam. The cardiac silhouette is mildly enlarged and unchanged in size. There is evidence of prior vertebroplasty within the lower thoracic spine. IMPRESSION: Chronic appearing increased lung markings without evidence of acute or active cardiopulmonary disease. Electronically Signed   By: Virgina Norfolk M.D.   On: 10/17/2019 03:20   DG Chest Port 1 View  Result Date: 09/25/2019 CLINICAL DATA:  Fall with femur fracture. EXAM: PORTABLE CHEST 1 VIEW COMPARISON:  06/19/2019 FINDINGS: Chronic cardiomegaly with left ventricular prominence. Chronic aortic atherosclerosis. Chronic pulmonary scarring left  more than right. No sign of active infiltrate, mass, effusion or collapse. No traumatic finding of an acute nature in the region. Old augmented lower thoracic fracture. IMPRESSION: No active cardiopulmonary disease. Chronic pulmonary scarring left worse than right. Electronically Signed   By: Nelson Chimes M.D.   On: 09/25/2019 15:18   DG Knee Complete 4 Views Right  Result Date: 10/17/2019 CLINICAL DATA:  Pain EXAM: RIGHT KNEE - COMPLETE 4+ VIEW COMPARISON:  None. FINDINGS: There is an acute displaced and angulated fracture of the right femoral metadiaphysis. There is a large suprapatellar joint effusion. The patient is status post prior plate screw fixation of the distal femur. There are 2 transcortical screws coursing through the femoral condyles which appear grossly intact. There are advanced degenerative changes of the knee. IMPRESSION: 1. Acute displaced and angulated fracture of the right femoral metadiaphysis. 2. Large suprapatellar joint effusion. 3. Advanced degenerative changes of the right knee. Electronically Signed   By: Constance Holster M.D.   On: 10/17/2019 02:13   DG Knee Complete 4 Views Right  Result Date: 09/26/2019 CLINICAL DATA:  85 year old female with distal right femur fracture, ORIF. EXAM: DG C-ARM 1-60 MIN; RIGHT KNEE - COMPLETE 4+ VIEW FLUOROSCOPY TIME:  Fluoroscopy Time:  0 minutes 50 seconds Radiation Exposure Index (if provided by the fluoroscopic device): 2.2 mGy Number of Acquired Spot Images: 0 COMPARISON:  Right knee CT 09/25/2019. FINDINGS: Seven intraoperative fluoroscopic spot views of the right knee demonstrate placement of medial malleable plate and screws and distal condyle level cannulated screws. Hardware appears intact. Alignment remains near anatomic. IMPRESSION: Distal right femur ORIF with no adverse features. Electronically Signed   By: Genevie Ann M.D.   On: 09/26/2019 15:42   DG Knee Complete 4 Views Right  Result Date: 09/25/2019 CLINICAL DATA:  Fall, pain  EXAM: RIGHT KNEE - COMPLETE 4+ VIEW COMPARISON:  None. FINDINGS: Alignment is anatomic. There is an acute fracture of the distal metaphysis of the femur involving the medial cortex. Minor displacement. There are changes of osteoarthritis with joint space narrowing and marginal osteophytes, greatest medially. Probable joint effusion. Vascular calcifications noted. IMPRESSION: Acute fracture of the medial cortex of the distal metaphysis of the right femur with minimal displacement. Probable joint effusion. Osteoarthritis. Electronically Signed   By: Macy Mis M.D.   On: 09/25/2019 12:27   DG Knee Right Port  Result Date: 10/17/2019 CLINICAL DATA:  Postop EXAM: PORTABLE RIGHT KNEE - 1-2 VIEW COMPARISON:  10/17/2019 FINDINGS: Incompletely visualized right femoral stem. Interval surgical plate and multiple screw fixation of the mid to distal femur across markedly comminuted fracture involving the distal femoral metaphysis and diaphysis. Previously noted medial plate and fixating screws have been removed. Residual 1/3 shaft diameter medial displacement of main distal fracture fragment and less than 1/4 shaft diameter posterior displacement of distal fracture fragment. Decreased angulation compared to previous. Gas in the soft tissues consistent with recent surgery. IMPRESSION: Interval surgical plate and screw fixation of markedly comminuted fracture involving the distal femoral metaphysis and diaphysis. Removal of prior medial fixating plate and screws from the distal femur. Electronically Signed   By: Donavan Foil M.D.   On: 10/17/2019 21:44   DG Knee Right Port  Result Date: 09/27/2019 CLINICAL DATA:  Status post ORIF of distal right femoral fracture. EXAM: PORTABLE RIGHT KNEE - 1-2 VIEW COMPARISON:  Intraoperative films from earlier in the same day.  FINDINGS: Fixation sideplate is noted along the lateral aspect of the distal femur. Two large fixation screws are noted traversing the distal aspect of the  femur. Fracture fragments are in near anatomic alignment. Small joint effusion is noted. IMPRESSION: ORIF of distal right femoral fracture. Electronically Signed   By: Inez Catalina M.D.   On: 09/27/2019 14:13   DG Foot Complete Right  Result Date: 09/26/2019 CLINICAL DATA:  85 year old female with fall and right knee fracture. Right foot pain. EXAM: RIGHT FOOT COMPLETE - 3+ VIEW COMPARISON:  None. FINDINGS: Portable views including cross-table lateral. Calcaneus and tarsal bones appear intact. Metatarsals appear intact with mild to moderate 1st MTP degeneration and mild hallux valgus. No phalanx fracture identified. Calcified peripheral vascular disease but no discrete soft tissue injury. IMPRESSION: No acute fracture or dislocation identified about the right foot. Electronically Signed   By: Genevie Ann M.D.   On: 09/26/2019 10:08   DG C-Arm 1-60 Min  Result Date: 10/17/2019 CLINICAL DATA:  ORIF right femur EXAM: RIGHT FEMUR 2 VIEWS; DG C-ARM 1-60 MIN COMPARISON:  10/17/2019 FINDINGS: Previous right hip replacement. 6 low resolution intraoperative spot views of the right femur. Total fluoroscopy time was 45 seconds. Removal of previously noted distal plate and fixating screws from the femur. Placement of long surgical plate and multiple fixating screws across comminuted fracture involving the distal thumb oral diaphysis and metaphysis. IMPRESSION: Intraoperative fluoroscopic assistance provided during surgical fixation of distal right femur fracture. Electronically Signed   By: Donavan Foil M.D.   On: 10/17/2019 19:31   DG C-Arm 1-60 Min  Result Date: 09/26/2019 CLINICAL DATA:  85 year old female with distal right femur fracture, ORIF. EXAM: DG C-ARM 1-60 MIN; RIGHT KNEE - COMPLETE 4+ VIEW FLUOROSCOPY TIME:  Fluoroscopy Time:  0 minutes 50 seconds Radiation Exposure Index (if provided by the fluoroscopic device): 2.2 mGy Number of Acquired Spot Images: 0 COMPARISON:  Right knee CT 09/25/2019. FINDINGS:  Seven intraoperative fluoroscopic spot views of the right knee demonstrate placement of medial malleable plate and screws and distal condyle level cannulated screws. Hardware appears intact. Alignment remains near anatomic. IMPRESSION: Distal right femur ORIF with no adverse features. Electronically Signed   By: Genevie Ann M.D.   On: 09/26/2019 15:42   DG Hip Unilat W or Wo Pelvis 2-3 Views Left  Result Date: 10/17/2019 CLINICAL DATA:  Pain EXAM: DG HIP (WITH OR WITHOUT PELVIS) 2-3V LEFT COMPARISON:  Aug 11, 2019 FINDINGS: The patient is status post total hip arthroplasty on the right. There is some mild, relatively stable lucency about the acetabular cup. There are healing fractures of the left inferior pubic ramus. There are degenerative changes of the left hip. There is osteopenia which limits detection of nondisplaced fractures. IMPRESSION: 1. No acute displaced fracture or dislocation. 2. Healing fracture of the left inferior pubic ramus. 3. Status post total hip arthroplasty on the right. 4. Osteopenia which limits detection of nondisplaced fractures. Electronically Signed   By: Constance Holster M.D.   On: 10/17/2019 02:11   DG Hip Unilat W or Wo Pelvis 2-3 Views Right  Result Date: 10/17/2019 CLINICAL DATA:  Hip pain EXAM: DG HIP (WITH OR WITHOUT PELVIS) 2-3V RIGHT COMPARISON:  May 2021 FINDINGS: The patient is status post total hip arthroplasty on the right. The hardware appears intact. There is some lucency about the acetabular cup which may indicate underlying loosening. There is no acute displaced fracture. IMPRESSION: No acute abnormality. Electronically Signed   By: Constance Holster  M.D.   On: 10/17/2019 02:12   DG Hip Unilat W or Wo Pelvis 2-3 Views Right  Result Date: 09/25/2019 CLINICAL DATA:  Pain following recent fall EXAM: DG HIP (WITH OR WITHOUT PELVIS) 2-3V RIGHT COMPARISON:  Aug 11, 2019. FINDINGS: Frontal pelvis as well as frontal and lateral right hip images were obtained. There  is a total hip replacement on the right with prosthetic components appearing well-seated. There is a healing fracture of the left ischium with callus formation in this area and bony remodeling. There is underlying osteoporosis. No acute appearing fracture evident. No dislocation. There is moderate narrowing of the left hip joint. There is degenerative change in the visualized lower lumbar spine with postoperative changes. There are multiple foci of arterial vascular calcification. IMPRESSION: Status post total hip replacement right with prosthetic components well-seated. No acute fracture evident.  Healing fracture left ischium. Moderate narrowing left hip joint.  No erosive change. Bones osteoporotic. Postoperative change as well as degenerative change lower lumbar spine. Electronically Signed   By: Lowella Grip III M.D.   On: 09/25/2019 12:28   DG FEMUR, MIN 2 VIEWS RIGHT  Result Date: 10/17/2019 CLINICAL DATA:  ORIF right femur EXAM: RIGHT FEMUR 2 VIEWS; DG C-ARM 1-60 MIN COMPARISON:  10/17/2019 FINDINGS: Previous right hip replacement. 6 low resolution intraoperative spot views of the right femur. Total fluoroscopy time was 45 seconds. Removal of previously noted distal plate and fixating screws from the femur. Placement of long surgical plate and multiple fixating screws across comminuted fracture involving the distal thumb oral diaphysis and metaphysis. IMPRESSION: Intraoperative fluoroscopic assistance provided during surgical fixation of distal right femur fracture. Electronically Signed   By: Donavan Foil M.D.   On: 10/17/2019 19:31   DG Femur Min 2 Views Right  Result Date: 10/17/2019 CLINICAL DATA:  Pain EXAM: RIGHT FEMUR 2 VIEWS COMPARISON:  None. FINDINGS: The patient has undergone prior plate and screw fixation of the distal femur at the level of the metadiaphysis. At this level, there is a new acute displaced fracture with significant associated angulation. There are 2 transcortical  screws through the femoral condyles that appear intact. There are degenerative changes of the knee. There is a large joint effusion. Vascular calcifications are noted. There is soft tissue swelling. IMPRESSION: 1. Acute displaced fracture of the distal femur at the level of the patient's prior plate and screw fixation. 2. Large joint effusion. 3. Advanced degenerative changes of the right knee. Electronically Signed   By: Constance Holster M.D.   On: 10/17/2019 02:08     Time coordinating discharge: Over 30 minutes    Dwyane Dee, MD  [facility] 10/23/2019, 11:41 AM Pager: Secure chat  If 7PM-7AM, please contact night-coverage [email or web address] Password [email or web address]

## 2019-10-23 NOTE — Progress Notes (Addendum)
Physical Therapy Treatment Patient Details Name: Betty Jackson MRN: [id number] DOB: [birth year] Today's Date: 10/23/2019    History of Present Illness Betty Jackson is a 85 y.o. female with PMH significant for HTN, HLD, CAD, paroxysmal A. fib,s, CVA, chronic diastolic CHF, CKD, chronic anemia, chronic low back pain, rheumatoid arthritis, giant cell arteritis, diverticulosis, GERD, hemorrhoids, anxiety. Patient was recently admitted earlier this month (6/28-7/2) after mechanical fall and distal R femur fx. Pt with recurrent fall 7/20 resulting in closed right distal femur fracture now s/p ORIF.     PT Comments    Patient progressing slowly towards PT goals. Reports minimal pain only with movement of RLE. Continues to have limited quad activation of RLE. Requires Mod A of 2 to laterally scoot into chair using pad for support/assist. Also tolerated standing from chair with assist of 2 to adjust pads but not compliant with NWb status RLE. Pt having difficulty assisting with mobility at times due to painful left wrist; bruising noted. Tolerated there ex. Son present but stepped out of room for session. Son reports pt's cognition is slightly improved today, but she is prone to hospital delirium. Continue to recommend SNF. Will follow.   Follow Up Recommendations  SNF     Equipment Recommendations  None recommended by PT    Recommendations for Other Services       Precautions / Restrictions Precautions Precautions: Fall Restrictions Weight Bearing Restrictions: Yes RLE Weight Bearing: Non weight bearing Other Position/Activity Restrictions: Unrestricted R knee ROM    Mobility  Bed Mobility Overal bed mobility: Needs Assistance Bed Mobility: Supine to Sit     Supine to sit: Mod assist;HOB elevated     General bed mobility comments: Assist with trunk and LLE to get to EOB, leaning left to offload right hip. Assist and increased time for reciprocal scooting.  Transfers Overall transfer  level: Needs assistance Equipment used: None;Rolling walker (2 wheeled) Transfers: Sit to/from Stand;Lateral/Scoot Transfers Sit to Stand: Mod assist;+2 physical assistance        Lateral/Scoot Transfers: Mod assist;+2 physical assistance General transfer comment: Laterally scooting along side bed towards right with drop arm , Mod A of 2 to assist with scooting using pad, takes multiple scoots. Stood from chair x1 to adjust pads, non compliant with WB despite cues.  Ambulation/Gait             General Gait Details: Unable due to NWB status   Stairs             Wheelchair Mobility    Modified Rankin (Stroke Patients Only)       Balance Overall balance assessment: Needs assistance Sitting-balance support: Feet supported;Single extremity supported Sitting balance-[name]: Fair Sitting balance - Comments: leaning on left UE due to pain in right hip. Postural control: Left lateral lean Standing balance support: During functional activity Standing balance-[name]: Poor                              Cognition Arousal/Alertness: Awake/alert Behavior During Therapy: WFL for tasks assessed/performed Overall Cognitive Status: Impaired/Different from baseline Area of Impairment: Memory;Problem solving                     Memory: Decreased short-term memory;Decreased recall of precautions       Problem Solving: Slow processing General Comments: Oriented to situation today. Per son, acting more like herself today but continues to have hospital delirium depending  on how much sleep she gets.      Exercises Total Joint Exercises Ankle Circles/Pumps: AROM;Both;10 reps;Supine Quad Sets: AROM;Both;10 reps;Supine Long Arc Quad: AAROM;Right;10 reps;Seated    General Comments General comments (skin integrity, edema, etc.): Pt's son present but stepped out for session.      Pertinent Vitals/Pain Pain Assessment: Faces Faces Pain Scale: Hurts  even more Pain Location: RLE with movement Pain Descriptors / Indicators: Aching;Operative site guarding;Sore Pain Intervention(s): Monitored during session;Repositioned    Home Living                      Prior Function            PT Goals (current goals can now be found in the care plan section) Progress towards PT goals: Progressing toward goals    Frequency    Min 3X/week      PT Plan Current plan remains appropriate    Co-evaluation              AM-PAC PT "6 Clicks" Mobility   Outcome Measure  Help needed turning from your back to your side while in a flat bed without using bedrails?: A Little Help needed moving from lying on your back to sitting on the side of a flat bed without using bedrails?: A Lot Help needed moving to and from a bed to a chair (including a wheelchair)?: A Lot Help needed standing up from a chair using your arms (e.g., wheelchair or bedside chair)?: A Lot Help needed to walk in hospital room?: Total Help needed climbing 3-5 steps with a railing? : Total 6 Click Score: 11    End of Session Equipment Utilized During Treatment: Gait belt Activity Tolerance: Patient tolerated treatment well Patient left: in chair;with call bell/phone within reach;with chair alarm set;with family/visitor present Nurse Communication: Mobility status PT Visit Diagnosis: Other abnormalities of gait and mobility (R26.89);Pain Pain - Right/Left: Right Pain - part of body: Leg     Time: 1000-1023 PT Time Calculation (min) (ACUTE ONLY): 23 min  Charges:  $Therapeutic Activity: 23-37 mins                     [name], PT, DPT Acute Rehabilitation Services Pager [phone] Office [phone]       [name] 10/23/2019, 12:50 PM

## 2019-10-23 NOTE — Social Work (Signed)
CSW received call from [facility] requesting a peer to peer with MD. Then number for MD to call is [phone] option 5. Peer to peer must be completed by 1pm. CSW sent secure chat to MD and current CSW working with pt.   Emeterio Reeve, Latanya Presser, [place] Social Worker [phone]

## 2019-10-24 DIAGNOSIS — R69 Illness, unspecified: Secondary | ICD-10-CM | POA: Diagnosis not present

## 2019-10-24 DIAGNOSIS — M48061 Spinal stenosis, lumbar region without neurogenic claudication: Secondary | ICD-10-CM | POA: Diagnosis not present

## 2019-10-24 DIAGNOSIS — M545 Low back pain: Secondary | ICD-10-CM | POA: Diagnosis not present

## 2019-10-24 DIAGNOSIS — Z4789 Encounter for other orthopedic aftercare: Secondary | ICD-10-CM | POA: Diagnosis not present

## 2019-10-24 DIAGNOSIS — R2681 Unsteadiness on feet: Secondary | ICD-10-CM | POA: Diagnosis not present

## 2019-10-24 DIAGNOSIS — Z9181 History of falling: Secondary | ICD-10-CM | POA: Diagnosis not present

## 2019-10-24 DIAGNOSIS — R278 Other lack of coordination: Secondary | ICD-10-CM | POA: Diagnosis not present

## 2019-10-24 DIAGNOSIS — M6281 Muscle weakness (generalized): Secondary | ICD-10-CM | POA: Diagnosis not present

## 2019-10-24 DIAGNOSIS — S72401D Unspecified fracture of lower end of right femur, subsequent encounter for closed fracture with routine healing: Secondary | ICD-10-CM | POA: Diagnosis not present

## 2019-10-25 ENCOUNTER — Emergency Department (HOSPITAL_COMMUNITY): Payer: Medicare Other

## 2019-10-25 ENCOUNTER — Other Ambulatory Visit: Payer: Self-pay

## 2019-10-25 ENCOUNTER — Encounter (HOSPITAL_COMMUNITY): Payer: Self-pay | Admitting: *Deleted

## 2019-10-25 ENCOUNTER — Inpatient Hospital Stay (HOSPITAL_COMMUNITY)
Admission: EM | Admit: 2019-10-25 | Discharge: 2019-10-30 | DRG: 871 | Disposition: A | Discharge status: Home Health | Payer: Medicare Other | Attending: Internal Medicine | Admitting: Internal Medicine

## 2019-10-25 DIAGNOSIS — J189 Pneumonia, unspecified organism: Secondary | ICD-10-CM | POA: Diagnosis not present

## 2019-10-25 DIAGNOSIS — I130 Hypertensive heart and chronic kidney disease with heart failure and stage 1 through stage 4 chronic kidney disease, or unspecified chronic kidney disease: Secondary | ICD-10-CM | POA: Diagnosis not present

## 2019-10-25 DIAGNOSIS — J168 Pneumonia due to other specified infectious organisms: Secondary | ICD-10-CM | POA: Diagnosis not present

## 2019-10-25 DIAGNOSIS — G9341 Metabolic encephalopathy: Secondary | ICD-10-CM | POA: Diagnosis not present

## 2019-10-25 DIAGNOSIS — Z8673 Personal history of transient ischemic attack (TIA), and cerebral infarction without residual deficits: Secondary | ICD-10-CM

## 2019-10-25 DIAGNOSIS — I739 Peripheral vascular disease, unspecified: Secondary | ICD-10-CM | POA: Diagnosis not present

## 2019-10-25 DIAGNOSIS — Z7401 Bed confinement status: Secondary | ICD-10-CM | POA: Diagnosis not present

## 2019-10-25 DIAGNOSIS — D649 Anemia, unspecified: Secondary | ICD-10-CM | POA: Diagnosis not present

## 2019-10-25 DIAGNOSIS — E876 Hypokalemia: Secondary | ICD-10-CM | POA: Diagnosis present

## 2019-10-25 DIAGNOSIS — Z515 Encounter for palliative care: Secondary | ICD-10-CM

## 2019-10-25 DIAGNOSIS — M19041 Primary osteoarthritis, right hand: Secondary | ICD-10-CM | POA: Diagnosis not present

## 2019-10-25 DIAGNOSIS — M316 Other giant cell arteritis: Secondary | ICD-10-CM | POA: Diagnosis present

## 2019-10-25 DIAGNOSIS — R5381 Other malaise: Secondary | ICD-10-CM | POA: Diagnosis not present

## 2019-10-25 DIAGNOSIS — R278 Other lack of coordination: Secondary | ICD-10-CM | POA: Diagnosis not present

## 2019-10-25 DIAGNOSIS — N1831 Chronic kidney disease, stage 3a: Secondary | ICD-10-CM | POA: Diagnosis not present

## 2019-10-25 DIAGNOSIS — A419 Sepsis, unspecified organism: Principal | ICD-10-CM

## 2019-10-25 DIAGNOSIS — E860 Dehydration: Secondary | ICD-10-CM | POA: Diagnosis present

## 2019-10-25 DIAGNOSIS — R52 Pain, unspecified: Secondary | ICD-10-CM

## 2019-10-25 DIAGNOSIS — Y95 Nosocomial condition: Secondary | ICD-10-CM | POA: Diagnosis present

## 2019-10-25 DIAGNOSIS — R29810 Facial weakness: Secondary | ICD-10-CM | POA: Diagnosis not present

## 2019-10-25 DIAGNOSIS — M79631 Pain in right forearm: Secondary | ICD-10-CM | POA: Diagnosis not present

## 2019-10-25 DIAGNOSIS — I480 Paroxysmal atrial fibrillation: Secondary | ICD-10-CM | POA: Diagnosis not present

## 2019-10-25 DIAGNOSIS — R300 Dysuria: Secondary | ICD-10-CM | POA: Diagnosis not present

## 2019-10-25 DIAGNOSIS — L03113 Cellulitis of right upper limb: Secondary | ICD-10-CM | POA: Diagnosis not present

## 2019-10-25 DIAGNOSIS — I5032 Chronic diastolic (congestive) heart failure: Secondary | ICD-10-CM | POA: Diagnosis present

## 2019-10-25 DIAGNOSIS — S72401A Unspecified fracture of lower end of right femur, initial encounter for closed fracture: Secondary | ICD-10-CM | POA: Diagnosis not present

## 2019-10-25 DIAGNOSIS — F05 Delirium due to known physiological condition: Secondary | ICD-10-CM | POA: Diagnosis present

## 2019-10-25 DIAGNOSIS — F331 Major depressive disorder, recurrent, moderate: Secondary | ICD-10-CM | POA: Diagnosis present

## 2019-10-25 DIAGNOSIS — Z743 Need for continuous supervision: Secondary | ICD-10-CM | POA: Diagnosis not present

## 2019-10-25 DIAGNOSIS — K219 Gastro-esophageal reflux disease without esophagitis: Secondary | ICD-10-CM | POA: Diagnosis present

## 2019-10-25 DIAGNOSIS — R0602 Shortness of breath: Secondary | ICD-10-CM | POA: Diagnosis not present

## 2019-10-25 DIAGNOSIS — Z66 Do not resuscitate: Secondary | ICD-10-CM

## 2019-10-25 DIAGNOSIS — M19031 Primary osteoarthritis, right wrist: Secondary | ICD-10-CM | POA: Diagnosis not present

## 2019-10-25 DIAGNOSIS — M19021 Primary osteoarthritis, right elbow: Secondary | ICD-10-CM | POA: Diagnosis not present

## 2019-10-25 DIAGNOSIS — J383 Other diseases of vocal cords: Secondary | ICD-10-CM | POA: Diagnosis present

## 2019-10-25 DIAGNOSIS — L89612 Pressure ulcer of right heel, stage 2: Secondary | ICD-10-CM | POA: Diagnosis not present

## 2019-10-25 DIAGNOSIS — Z9841 Cataract extraction status, right eye: Secondary | ICD-10-CM

## 2019-10-25 DIAGNOSIS — R109 Unspecified abdominal pain: Secondary | ICD-10-CM | POA: Diagnosis not present

## 2019-10-25 DIAGNOSIS — R509 Fever, unspecified: Secondary | ICD-10-CM | POA: Diagnosis not present

## 2019-10-25 DIAGNOSIS — N179 Acute kidney failure, unspecified: Secondary | ICD-10-CM | POA: Diagnosis not present

## 2019-10-25 DIAGNOSIS — R6520 Severe sepsis without septic shock: Secondary | ICD-10-CM | POA: Diagnosis not present

## 2019-10-25 DIAGNOSIS — Z9181 History of falling: Secondary | ICD-10-CM | POA: Diagnosis not present

## 2019-10-25 DIAGNOSIS — Z7952 Long term (current) use of systemic steroids: Secondary | ICD-10-CM

## 2019-10-25 DIAGNOSIS — J9811 Atelectasis: Secondary | ICD-10-CM | POA: Diagnosis not present

## 2019-10-25 DIAGNOSIS — L89152 Pressure ulcer of sacral region, stage 2: Secondary | ICD-10-CM | POA: Diagnosis present

## 2019-10-25 DIAGNOSIS — R5383 Other fatigue: Secondary | ICD-10-CM | POA: Diagnosis not present

## 2019-10-25 DIAGNOSIS — I2510 Atherosclerotic heart disease of native coronary artery without angina pectoris: Secondary | ICD-10-CM | POA: Diagnosis present

## 2019-10-25 DIAGNOSIS — R0902 Hypoxemia: Secondary | ICD-10-CM | POA: Diagnosis not present

## 2019-10-25 DIAGNOSIS — F411 Generalized anxiety disorder: Secondary | ICD-10-CM | POA: Diagnosis present

## 2019-10-25 DIAGNOSIS — M810 Age-related osteoporosis without current pathological fracture: Secondary | ICD-10-CM | POA: Diagnosis present

## 2019-10-25 DIAGNOSIS — R531 Weakness: Secondary | ICD-10-CM | POA: Diagnosis not present

## 2019-10-25 DIAGNOSIS — M6281 Muscle weakness (generalized): Secondary | ICD-10-CM | POA: Diagnosis not present

## 2019-10-25 DIAGNOSIS — E785 Hyperlipidemia, unspecified: Secondary | ICD-10-CM | POA: Diagnosis not present

## 2019-10-25 DIAGNOSIS — M2550 Pain in unspecified joint: Secondary | ICD-10-CM | POA: Diagnosis not present

## 2019-10-25 DIAGNOSIS — Z20822 Contact with and (suspected) exposure to covid-19: Secondary | ICD-10-CM | POA: Diagnosis present

## 2019-10-25 DIAGNOSIS — Z8249 Family history of ischemic heart disease and other diseases of the circulatory system: Secondary | ICD-10-CM

## 2019-10-25 DIAGNOSIS — M48061 Spinal stenosis, lumbar region without neurogenic claudication: Secondary | ICD-10-CM | POA: Diagnosis not present

## 2019-10-25 DIAGNOSIS — M419 Scoliosis, unspecified: Secondary | ICD-10-CM | POA: Diagnosis present

## 2019-10-25 DIAGNOSIS — R404 Transient alteration of awareness: Secondary | ICD-10-CM | POA: Diagnosis not present

## 2019-10-25 DIAGNOSIS — M545 Low back pain: Secondary | ICD-10-CM | POA: Diagnosis not present

## 2019-10-25 DIAGNOSIS — M069 Rheumatoid arthritis, unspecified: Secondary | ICD-10-CM | POA: Diagnosis present

## 2019-10-25 DIAGNOSIS — Z79899 Other long term (current) drug therapy: Secondary | ICD-10-CM

## 2019-10-25 DIAGNOSIS — J90 Pleural effusion, not elsewhere classified: Secondary | ICD-10-CM | POA: Diagnosis not present

## 2019-10-25 DIAGNOSIS — I499 Cardiac arrhythmia, unspecified: Secondary | ICD-10-CM | POA: Diagnosis not present

## 2019-10-25 DIAGNOSIS — M5127 Other intervertebral disc displacement, lumbosacral region: Secondary | ICD-10-CM | POA: Diagnosis not present

## 2019-10-25 DIAGNOSIS — R9431 Abnormal electrocardiogram [ECG] [EKG]: Secondary | ICD-10-CM | POA: Diagnosis not present

## 2019-10-25 DIAGNOSIS — I10 Essential (primary) hypertension: Secondary | ICD-10-CM | POA: Diagnosis present

## 2019-10-25 DIAGNOSIS — Z961 Presence of intraocular lens: Secondary | ICD-10-CM | POA: Diagnosis present

## 2019-10-25 DIAGNOSIS — Z7902 Long term (current) use of antithrombotics/antiplatelets: Secondary | ICD-10-CM

## 2019-10-25 DIAGNOSIS — G894 Chronic pain syndrome: Secondary | ICD-10-CM | POA: Diagnosis present

## 2019-10-25 DIAGNOSIS — Z4789 Encounter for other orthopedic aftercare: Secondary | ICD-10-CM | POA: Diagnosis not present

## 2019-10-25 DIAGNOSIS — N183 Chronic kidney disease, stage 3 unspecified: Secondary | ICD-10-CM | POA: Diagnosis present

## 2019-10-25 DIAGNOSIS — R079 Chest pain, unspecified: Secondary | ICD-10-CM | POA: Diagnosis not present

## 2019-10-25 DIAGNOSIS — R319 Hematuria, unspecified: Secondary | ICD-10-CM | POA: Diagnosis not present

## 2019-10-25 DIAGNOSIS — I469 Cardiac arrest, cause unspecified: Secondary | ICD-10-CM | POA: Diagnosis not present

## 2019-10-25 DIAGNOSIS — F5104 Psychophysiologic insomnia: Secondary | ICD-10-CM | POA: Diagnosis present

## 2019-10-25 DIAGNOSIS — Z90710 Acquired absence of both cervix and uterus: Secondary | ICD-10-CM

## 2019-10-25 DIAGNOSIS — S72401D Unspecified fracture of lower end of right femur, subsequent encounter for closed fracture with routine healing: Secondary | ICD-10-CM | POA: Diagnosis not present

## 2019-10-25 DIAGNOSIS — Z7189 Other specified counseling: Secondary | ICD-10-CM

## 2019-10-25 DIAGNOSIS — R2681 Unsteadiness on feet: Secondary | ICD-10-CM | POA: Diagnosis not present

## 2019-10-25 DIAGNOSIS — G9340 Encephalopathy, unspecified: Secondary | ICD-10-CM | POA: Diagnosis not present

## 2019-10-25 DIAGNOSIS — N390 Urinary tract infection, site not specified: Secondary | ICD-10-CM | POA: Diagnosis not present

## 2019-10-25 LAB — CBC WITH DIFFERENTIAL/PLATELET
Abs Immature Granulocytes: 0.32 10*3/uL — ABNORMAL HIGH (ref 0.00–0.07)
Basophils Absolute: 0.1 10*3/uL (ref 0.0–0.1)
Basophils Relative: 0 %
Eosinophils Absolute: 0.1 10*3/uL (ref 0.0–0.5)
Eosinophils Relative: 0 %
HCT: 35.6 % — ABNORMAL LOW (ref 36.0–46.0)
Hemoglobin: 11.3 g/dL — ABNORMAL LOW (ref 12.0–15.0)
Immature Granulocytes: 2 %
Lymphocytes Relative: 9 %
Lymphs Abs: 1.4 10*3/uL (ref 0.7–4.0)
MCH: 29.7 pg (ref 26.0–34.0)
MCHC: 31.7 g/dL (ref 30.0–36.0)
MCV: 93.4 fL (ref 80.0–100.0)
Monocytes Absolute: 1.9 10*3/uL — ABNORMAL HIGH (ref 0.1–1.0)
Monocytes Relative: 12 %
Neutro Abs: 12.1 10*3/uL — ABNORMAL HIGH (ref 1.7–7.7)
Neutrophils Relative %: 77 %
Platelets: 338 10*3/uL (ref 150–400)
RBC: 3.81 MIL/uL — ABNORMAL LOW (ref 3.87–5.11)
RDW: 14.2 % (ref 11.5–15.5)
WBC: 15.8 10*3/uL — ABNORMAL HIGH (ref 4.0–10.5)
nRBC: 0 % (ref 0.0–0.2)

## 2019-10-25 LAB — URINALYSIS, ROUTINE W REFLEX MICROSCOPIC
Bilirubin Urine: NEGATIVE
Glucose, UA: NEGATIVE mg/dL
Ketones, ur: NEGATIVE mg/dL
Leukocytes,Ua: NEGATIVE
Nitrite: NEGATIVE
Protein, ur: 30 mg/dL — AB
Specific Gravity, Urine: 1.018 (ref 1.005–1.030)
pH: 5 (ref 5.0–8.0)

## 2019-10-25 LAB — COMPREHENSIVE METABOLIC PANEL
ALT: 19 U/L (ref 0–44)
AST: 22 U/L (ref 15–41)
Albumin: 2.6 g/dL — ABNORMAL LOW (ref 3.5–5.0)
Alkaline Phosphatase: 146 U/L — ABNORMAL HIGH (ref 38–126)
Anion gap: 12 (ref 5–15)
BUN: 13 mg/dL (ref 8–23)
CO2: 26 mmol/L (ref 22–32)
Calcium: 8.6 mg/dL — ABNORMAL LOW (ref 8.9–10.3)
Chloride: 97 mmol/L — ABNORMAL LOW (ref 98–111)
Creatinine, Ser: 1.23 mg/dL — ABNORMAL HIGH (ref 0.44–1.00)
GFR calc Af Amer: 47 mL/min — ABNORMAL LOW (ref >60)
GFR calc non Af Amer: 41 mL/min — ABNORMAL LOW (ref >60)
Glucose, Bld: 109 mg/dL — ABNORMAL HIGH (ref 70–99)
Potassium: 3.6 mmol/L (ref 3.5–5.1)
Sodium: 135 mmol/L (ref 135–145)
Total Bilirubin: 2.4 mg/dL — ABNORMAL HIGH (ref 0.3–1.2)
Total Protein: 6.2 g/dL — ABNORMAL LOW (ref 6.5–8.1)

## 2019-10-25 LAB — PROTIME-INR
INR: 1.3 — ABNORMAL HIGH (ref 0.8–1.2)
Prothrombin Time: 15.4 seconds — ABNORMAL HIGH (ref 11.4–15.2)

## 2019-10-25 LAB — LACTIC ACID, PLASMA
Lactic Acid, Venous: 1.6 mmol/L (ref 0.5–1.9)
Lactic Acid, Venous: 2 mmol/L (ref 0.5–1.9)

## 2019-10-25 LAB — SARS CORONAVIRUS 2 BY RT PCR (HOSPITAL ORDER, PERFORMED IN ~~LOC~~ HOSPITAL LAB): SARS Coronavirus 2: NEGATIVE

## 2019-10-25 LAB — APTT: aPTT: 32 seconds (ref 24–36)

## 2019-10-25 MED ORDER — ACETAMINOPHEN 650 MG RE SUPP: 650.0000 mg | Freq: Four times a day (QID) | RECTAL | Status: DC | PRN

## 2019-10-25 MED ORDER — ENOXAPARIN SODIUM 40 MG/0.4ML ~~LOC~~ SOLN
40.0000 mg | SUBCUTANEOUS | Status: DC
  Administered 2019-10-25 – 2019-10-29 (×5): 40 mg via SUBCUTANEOUS
  Filled 2019-10-25 (×5): qty 0.4

## 2019-10-25 MED ORDER — VANCOMYCIN HCL 1500 MG/300ML IV SOLN
1500.0000 mg | Freq: Once | INTRAVENOUS | Status: AC
  Administered 2019-10-25: 1500 mg via INTRAVENOUS
  Filled 2019-10-25: qty 300

## 2019-10-25 MED ORDER — ONDANSETRON HCL 4 MG PO TABS: 4.0000 mg | ORAL_TABLET | Freq: Four times a day (QID) | ORAL | Status: DC | PRN

## 2019-10-25 MED ORDER — LACTATED RINGERS IV BOLUS
1000.0000 mL | Freq: Once | INTRAVENOUS | Status: AC
  Administered 2019-10-25: 1000 mL via INTRAVENOUS

## 2019-10-25 MED ORDER — SACCHAROMYCES BOULARDII 250 MG PO CAPS
250.0000 mg | ORAL_CAPSULE | Freq: Two times a day (BID) | ORAL | Status: DC
  Administered 2019-10-26 – 2019-10-30 (×9): 250 mg via ORAL
  Filled 2019-10-25 (×9): qty 1

## 2019-10-25 MED ORDER — ACETAMINOPHEN 650 MG RE SUPP: 650.0000 mg | Freq: Once | RECTAL | Status: DC

## 2019-10-25 MED ORDER — METOPROLOL TARTRATE 50 MG PO TABS
50.0000 mg | ORAL_TABLET | Freq: Two times a day (BID) | ORAL | Status: DC
  Administered 2019-10-26 – 2019-10-30 (×9): 50 mg via ORAL
  Filled 2019-10-25 (×9): qty 1

## 2019-10-25 MED ORDER — HYDROCORTISONE NA SUCCINATE PF 100 MG IJ SOLR
50.0000 mg | Freq: Three times a day (TID) | INTRAMUSCULAR | Status: DC
  Administered 2019-10-25 – 2019-10-26 (×2): 50 mg via INTRAVENOUS
  Filled 2019-10-25 (×2): qty 2

## 2019-10-25 MED ORDER — SODIUM CHLORIDE 0.9 % IV SOLN: INTRAVENOUS | Status: DC

## 2019-10-25 MED ORDER — PREDNISONE 10 MG PO TABS
10.0000 mg | ORAL_TABLET | Freq: Every day | ORAL | Status: DC
  Administered 2019-10-26 – 2019-10-30 (×5): 10 mg via ORAL
  Filled 2019-10-25 (×5): qty 1

## 2019-10-25 MED ORDER — ONDANSETRON HCL 4 MG/2ML IJ SOLN: 4.0000 mg | Freq: Four times a day (QID) | INTRAMUSCULAR | Status: DC | PRN

## 2019-10-25 MED ORDER — SODIUM CHLORIDE 0.9 % IV SOLN
2.0000 g | Freq: Two times a day (BID) | INTRAVENOUS | Status: DC
  Administered 2019-10-26 – 2019-10-28 (×5): 2 g via INTRAVENOUS
  Filled 2019-10-25 (×7): qty 2

## 2019-10-25 MED ORDER — VANCOMYCIN HCL IN DEXTROSE 1-5 GM/200ML-% IV SOLN: 1000.0000 mg | Freq: Once | INTRAVENOUS | Status: DC

## 2019-10-25 MED ORDER — LACTATED RINGERS IV BOLUS (SEPSIS)
1000.0000 mL | Freq: Once | INTRAVENOUS | Status: AC
  Administered 2019-10-25: 1000 mL via INTRAVENOUS

## 2019-10-25 MED ORDER — SODIUM CHLORIDE 0.9 % IV SOLN
2.0000 g | Freq: Once | INTRAVENOUS | Status: AC
  Administered 2019-10-25: 2 g via INTRAVENOUS
  Filled 2019-10-25: qty 2

## 2019-10-25 MED ORDER — ACETAMINOPHEN 500 MG PO TABS
1000.0000 mg | ORAL_TABLET | Freq: Once | ORAL | Status: DC
  Filled 2019-10-25: qty 2

## 2019-10-25 MED ORDER — VANCOMYCIN HCL IN DEXTROSE 1-5 GM/200ML-% IV SOLN: 1000.0000 mg | INTRAVENOUS | Status: DC

## 2019-10-25 MED ORDER — ACETAMINOPHEN 325 MG PO TABS
650.0000 mg | ORAL_TABLET | Freq: Four times a day (QID) | ORAL | Status: DC | PRN
  Administered 2019-10-26 (×2): 650 mg via ORAL
  Filled 2019-10-25 (×3): qty 2

## 2019-10-25 MED ORDER — LACTATED RINGERS IV BOLUS (SEPSIS): 250.0000 mL | Freq: Once | INTRAVENOUS | Status: DC

## 2019-10-25 NOTE — ED Notes (Signed)
MD Gokul , paged to be made aware of lactic acid 2.0 , awaiting orders or call back

## 2019-10-25 NOTE — ED Provider Notes (Signed)
[facility] EMERGENCY DEPARTMENT Provider Note   CSN: [id number] Arrival date & time: 10/25/19  1626     History No chief complaint on file.   Betty Jackson is a 85 y.o. female.  HPI     Betty Jackson a 85 y.o.femalewith PMH significant for HTN, HLD, CAD, paroxysmal A. fib not on anticoagulation because of falls, CVA, chronic diastolic CHF, CKD, chronic anemia, chronic low back pain, rheumatoid arthritis, giant cell arteritis, diverticulosis, GERD, hemorrhoids, anxiety recent hospitalizations for various problems includingC. Difficile, diverticulitis, GI bleed, hand cellulitis, right femur fracture s/p ORIF in June followed by repeat fall and fracture requiring repeat right ORIF with Dr. Marcelino Scot 7/20 discharged to [facility] 2 days ago who presents with concern for fever with CXR found on XR and noted tachycardia and AMS at facility.  Went home 2 days ago. Daughter states she didn't sound right over the phone but she was unable to visit as [facility] has a COVID case.  Facility today did evaluation including CXR which showed left sided pneumonia, leukocytosis. She was found to be febrile to 102 with HR 120.  Patietn reports she has been coughing "for 20 years." Denies dyspnea, chest pain, abdominal pain. Had n/v last night. Reports feeling weak. Low appetite.  Reports bilateral arm pain.    History is limited--Is able to provide history but slow to respond, sleepy and soft spoken/weak and requires repeat questioning    Past Medical History:  Diagnosis Date  . Acute respiratory failure with hypoxia ([place]) 05/20/2017  . Anemia, unspecified 10/28/2012  . Anxiety state, unspecified 10/28/2012  . CAD (coronary artery disease)    a. Stent RCA in [facility];  b. Cath approx 2009 - nonobs per pt report.  . Cataract    immature on the left eye  . Chronic insomnia 02/07/2013  . Chronic lower back pain    scoliosis  . CKD (chronic kidney disease) stage 3, GFR 30-59 ml/min   .  CVA (cerebral infarction) 10/29/2012  . DDD (degenerative disc disease)   . Depression   . Diverticulosis   . GERD (gastroesophageal reflux disease) 09/01/2010  . Hemorrhoids   . Herniated nucleus pulposus, L5-S1, right 11/04/2015  . Hyperlipidemia    takes Lipitor daily  . Hypertension    takes Amlodipine,Losartan,Metoprolol,and HCTZ daily  . Incontinence of urine   . Insomnia    takes Restoril nightly  . Lumbar stenosis 04/25/2013  . Major depressive disorder, recurrent episode, moderate ([place]) 07/16/2013  . Osteoporosis   . PAF (paroxysmal atrial fibrillation) ([place]) 2011   a. lone epidode in 2011 according to notes.  . Rheumatoid arthritis ([place])   . Scoliosis   . Sepsis ([place]) 05/2019  . Temporal arteritis ([place]) 2011   a. followed @ [facility]; potential flareup 10/28/2012/notes 10/28/2012  . Vocal cord dysfunction    "they don't operate properly" (10/28/2012)    Patient Active Problem List   Diagnosis Date Noted  . Pressure injury of skin 10/18/2019  . Closed fracture of right distal femur ([place]) 10/17/2019  . Femur fracture, right ([place]) 09/25/2019  . Chronic diastolic CHF (congestive heart failure) ([place]) 09/25/2019  . HCAP (healthcare-associated pneumonia) 06/19/2019  . Encephalopathy 06/19/2019  . Lumbar degenerative disc disease   . Paroxysmal A-fib ([place])   . Hypomagnesemia   . C. difficile colitis   . Diarrhea 05/29/2019  . CHF (congestive heart failure) ([place]) 05/29/2019  . Recurrent falls 04/30/2019  . Pericardial effusion without cardiac tamponade 03/29/2019  .  Hypoalbuminemia 03/27/2019  . Cardiac tamponade   . Pericarditis 03/06/2019  . Fever   . Septic shock ([place])   . Atypical chest pain   . Atrial fibrillation with rapid ventricular response ([place])   . Nausea 01/17/2019  . Acute diverticulitis 01/13/2019  . Chronic back pain 01/13/2019  . Tachycardia 01/13/2019  . Hypokalemia 01/13/2019  . Chronic low back pain 07/10/2018  . Influenza A 05/20/2017  . Sepsis ([place])  05/20/2017  . Acute respiratory failure with hypoxia ([place]) 05/20/2017  . CKD (chronic kidney disease), stage III 05/20/2017  . PAF (paroxysmal atrial fibrillation) ([place]) 05/20/2017  . HTN (hypertension) 05/20/2017  . Giant cell arteritis ([place]) 05/20/2017  . Coronary disease 05/20/2017  . Abnormal urinalysis 05/20/2017  . Osteoporosis 05/08/2016  . Herniated nucleus pulposus, L5-S1, right 11/04/2015  . Major depressive disorder, recurrent episode, moderate ([place]) 07/16/2013  . Abdominal pain, epigastric 06/22/2013  . Lumbar stenosis 04/25/2013  . Chronic insomnia 02/07/2013  . Elevated transaminase level 02/07/2013  . CVA (cerebral infarction) 10/29/2012  . Visual changes 10/28/2012  . Depression 10/28/2012  . Anxiety state 10/28/2012  . Anemia, unspecified 10/28/2012  . Nonspecific (abnormal) findings on radiological and other examination of gastrointestinal tract 01/25/2012  . Hyponatremia 01/24/2012  . Hematuria 01/24/2012  . Enteritis 12/24/2011  . Abdominal pain, other specified site 12/24/2011  . Leg pain, right 02/18/2011  . Temporal arteritis ([place]) 09/01/2010  . Hypertension 09/01/2010  . Hyperlipidemia 09/01/2010  . History of atrial fibrillation 09/01/2010  . CAD (coronary artery disease) 09/01/2010  . GERD (gastroesophageal reflux disease) 09/01/2010  . Insomnia 09/01/2010    Past Surgical History:  Procedure Laterality Date  . ABDOMINAL HYSTERECTOMY  ~ 1984   vaginally  . BACK SURGERY  7-71yrs ago   X Stop  . BLADDER SUSPENSION  2001  . BREAST BIOPSY Right   . CATARACT EXTRACTION W/ INTRAOCULAR LENS IMPLANT Right ~ 08/2012  . CHEST TUBE INSERTION Left 03/08/2019   Procedure: Chest Tube Insertion;  Surgeon: Ivin Poot, MD;  Location: [facility];  Service: Thoracic;  Laterality: Left;  . COLONOSCOPY  01/26/2012   Procedure: COLONOSCOPY;  Surgeon: Ladene Artist, MD,FACG;  Location: [facility] ENDOSCOPY;  Service: Endoscopy;  Laterality: N/A;  note the EGD is possible  .  CORONARY ANGIOPLASTY WITH STENT PLACEMENT  2006   X 1 stent  . EPIDURAL BLOCK INJECTION    . ESOPHAGOGASTRODUODENOSCOPY  01/26/2012   Procedure: ESOPHAGOGASTRODUODENOSCOPY (EGD);  Surgeon: Ladene Artist, MD,FACG;  Location[facility] ENDOSCOPY;  Service: Endoscopy;  Laterality: N/A;  . FINE NEEDLE ASPIRATION Right 09/26/2019   Procedure: FINE NEEDLE ASPIRATION;  Surgeon: Altamese [place], MD;  Location: [facility];  Service: Orthopedics;  Laterality: Right;  . HEMIARTHROPLASTY HIP Right 2012  . IR EPIDUROGRAPHY  04/20/2019  . LAPAROSCOPIC CHOLECYSTECTOMY  2001  . LUMBAR FUSION  03/2013  . LUMBAR LAMINECTOMY/DECOMPRESSION MICRODISCECTOMY Right 11/04/2015   Procedure: Right Lumbar Five-Sacral One Microdiskectomy;  Surgeon: Kristeen Miss, MD;  Location: [facility] NEURO ORS;  Service: Neurosurgery;  Laterality: Right;  Right L5-S1 Microdiskectomy  . moles removed that required stiches     one on leg and one on face  . ORIF FEMUR FRACTURE Right 09/26/2019   Procedure: OPEN REDUCTION INTERNAL FIXATION (ORIF) DISTAL FEMUR FRACTURE;  Surgeon: Altamese [place], MD;  Location: [facility];  Service: Orthopedics;  Laterality: Right;  . ORIF FEMUR FRACTURE Right 10/17/2019   Procedure: OPEN REDUCTION INTERNAL FIXATION (ORIF) DISTAL FEMUR FRACTURE;  Surgeon: Altamese , MD;  Location: [facility]  OR;  Service: Orthopedics;  Laterality: Right;  . SUBXYPHOID PERICARDIAL WINDOW N/A 03/08/2019   Procedure: SUBXYPHOID PERICARDIAL WINDOW;  Surgeon: Ivin Poot, MD;  Location: [facility];  Service: Thoracic;  Laterality: N/A;  . TEE WITHOUT CARDIOVERSION N/A 03/08/2019   Procedure: TRANSESOPHAGEAL ECHOCARDIOGRAM (TEE);  Surgeon: Prescott Gum, Collier [place]ina, MD;  Location: [facility];  Service: Thoracic;  Laterality: N/A;  . TEMPORAL ARTERY BIOPSY / LIGATION Bilateral 2011  . TONSILLECTOMY AND ADENOIDECTOMY     at age 73  . TUBAL LIGATION  ~ 1982  . X-STOP IMPLANTATION  ~ 2010   "lower back" (10/28/2012)     OB History   No obstetric history on file.      Family History  Problem Relation Age of Onset  . Brain cancer Mother 74  . Heart attack Father   . Breast cancer Daughter 8  . Heart disease Other   . Hypertension Other   . Arthritis Neg Hx   . Colon cancer Neg Hx   . Osteoporosis Neg Hx     Social History   Tobacco Use  . Smoking status: Never Smoker  . Smokeless tobacco: Never Used  Vaping Use  . Vaping Use: Never used  Substance Use Topics  . Alcohol use: Yes    Comment: socially  . Drug use: No    Home Medications Prior to Admission medications   Medication Sig Start Date End Date Taking? Authorizing Provider  ALPRAZolam Duanne Moron) 0.5 MG tablet Take 1 tablet (0.5 mg total) by mouth at bedtime as needed for sleep. 10/23/19  Yes Dwyane Dee, MD  azithromycin (ZITHROMAX) 500 MG tablet Take 500 mg by mouth daily. 10/25/19  Yes [provider]  cholecalciferol (VITAMIN D) 25 MCG tablet Take 2 tablets (2,000 Units total) by mouth 2 (two) times daily. 09/29/19  Yes Ainsley Spinner, PA-C  diltiazem (CARDIZEM CD) 240 MG 24 hr capsule Take 1 capsule (240 mg total) by mouth daily. 07/19/19  Yes Bhagat, Bhavinkumar, PA  enoxaparin (LOVENOX) 30 MG/0.3ML injection Inject 0.3 mLs (30 mg total) into the skin daily. 10/21/19 11/20/19 Yes Ainsley Spinner, PA-C  escitalopram (LEXAPRO) 10 MG tablet TAKE 1 TABLET BY MOUTH EVERY DAY Patient taking differently: Take 10 mg by mouth daily.  12/14/18  Yes Burchette, Alinda Sierras, MD  famotidine (PEPCID) 20 MG tablet Take 1 tablet (20 mg total) by mouth daily. 06/02/19  Yes Eugenie Filler, MD  fentaNYL (DURAGESIC) 12 MCG/HR Place 1 patch onto the skin every 3 (three) days. 09/29/19  Yes Ghimire, Henreitta Leber, MD  ferrous sulfate 325 (65 FE) MG tablet Take 1 tablet (325 mg total) by mouth 2 (two) times daily with a meal. 04/21/19  Yes Darrick Meigs, Marge Duncans, MD  furosemide (LASIX) 40 MG tablet Take 1 tablet (40 mg total) by mouth daily. 06/03/19  Yes Eugenie Filler, MD  HYDROcodone-acetaminophen [facility]) 10-325 MG  tablet Take one tablet by mouth every 8 hours as needed for breakthrough pain. 10/23/19  Yes Dwyane Dee, MD  LACTOBACILLUS PO Take 2 capsules by mouth 3 (three) times daily.   Yes [provider]  metoprolol tartrate (LOPRESSOR) 50 MG tablet Take 50 mg by mouth 2 (two) times daily.   Yes [provider]  Multiple Vitamin (MULTIVITAMIN WITH MINERALS) TABS tablet Take 1 tablet by mouth daily. 10/21/19  Yes Ainsley Spinner, PA-C  potassium chloride SA (KLOR-CON) 10 MEQ tablet Take 1 tablet (10 mEq total) by mouth daily. 04/21/19  Yes Oswald Hillock, MD  predniSONE (DELTASONE) 10 MG tablet Take 1 tablet (10 mg total) by mouth daily with breakfast. 10/23/19  Yes Dwyane Dee, MD  senna-docusate (SENOKOT-S) 8.6-50 MG tablet Take 1 tablet by mouth at bedtime.   Yes [provider]  vitamin C (ASCORBIC ACID) 500 MG tablet Take 1,000 mg by mouth daily.    Yes [provider]  vitamin E (VITAMIN E) 400 UNIT capsule Take 400 Units by mouth daily.   Yes [provider]    Allergies    Dextromethorphan  Review of Systems   Review of Systems  Constitutional: Positive for activity change, appetite change, fatigue and fever.  HENT: Negative for sore throat.   Eyes: Negative for visual disturbance.  Respiratory: Positive for cough (reports for 20 years). Negative for shortness of breath.   Cardiovascular: Negative for chest pain.  Gastrointestinal: Positive for nausea (last night denies now) and vomiting. Negative for abdominal pain and diarrhea.  Genitourinary: Negative for difficulty urinating and dysuria.  Musculoskeletal: Positive for arthralgias. Negative for back pain and neck pain.  Skin: Negative for rash.  Neurological: Negative for syncope and headaches.    Physical Exam Updated Vital Signs BP (!) 139/57   Pulse 86   Temp 98.5 F (36.9 C) (Oral)   Resp (!) 25   Ht 5\' 4"  (1.626 m)   Wt 68 kg   SpO2 96%   BMI 25.75 kg/m   Physical Exam Vitals  and nursing note reviewed.  Constitutional:      General: She is not in acute distress.    Appearance: She is well-developed. She is not diaphoretic.  HENT:     Head: Normocephalic and atraumatic.  Eyes:     Conjunctiva/sclera: Conjunctivae normal.  Cardiovascular:     Rate and Rhythm: Normal rate and regular rhythm.     Heart sounds: Normal heart sounds. No murmur heard.  No friction rub. No gallop.   Pulmonary:     Effort: Pulmonary effort is normal. No respiratory distress.     Breath sounds: Normal breath sounds. No wheezing or rales.  Abdominal:     General: There is no distension.     Palpations: Abdomen is soft.     Tenderness: There is no abdominal tenderness. There is no guarding.  Musculoskeletal:        General: No tenderness.     Cervical back: Normal range of motion.  Skin:    General: Skin is warm and dry.     Findings: No erythema or rash.     Comments: Incision right upper leg C/D/I   Neurological:     Mental Status: She is alert and oriented to person, place, and time.     Comments: Sleepy appearing and speaks softly but oriented x 4      ED Results / Procedures / Treatments   Labs (all labs ordered are listed, but only abnormal results are displayed) Labs Reviewed  COMPREHENSIVE METABOLIC PANEL - Abnormal; Notable for the following components:      Result Value   Chloride 97 (*)    Glucose, Bld 109 (*)    Creatinine, Ser 1.23 (*)    Calcium 8.6 (*)    Total Protein 6.2 (*)    Albumin 2.6 (*)    Alkaline Phosphatase 146 (*)    Total Bilirubin 2.4 (*)    GFR calc non Af Amer 41 (*)    GFR calc Af Amer 47 (*)    All other components within normal limits  CBC WITH DIFFERENTIAL/PLATELET - Abnormal; Notable for the following components:   WBC 15.8 (*)    RBC 3.81 (*)    Hemoglobin 11.3 (*)    HCT 35.6 (*)    Neutro Abs 12.1 (*)    Monocytes Absolute 1.9 (*)    Abs Immature Granulocytes 0.32 (*)    All other components within normal limits   PROTIME-INR - Abnormal; Notable for the following components:   Prothrombin Time 15.4 (*)    INR 1.3 (*)    All other components within normal limits  URINALYSIS, ROUTINE W REFLEX MICROSCOPIC - Abnormal; Notable for the following components:   Color, Urine AMBER (*)    APPearance HAZY (*)    Hgb urine dipstick MODERATE (*)    Protein, ur 30 (*)    Bacteria, UA RARE (*)    All other components within normal limits  CULTURE, BLOOD (ROUTINE X 2)  CULTURE, BLOOD (ROUTINE X 2)  URINE CULTURE  SARS CORONAVIRUS 2 BY RT PCR (HOSPITAL ORDER, [place])  LACTIC ACID, PLASMA  APTT  LACTIC ACID, PLASMA  COMPREHENSIVE METABOLIC PANEL  CBC    EKG EKG Interpretation  Date/Time:  Wednesday October 25 2019 16:48:30 EDT Ventricular Rate:  74 PR Interval:    QRS Duration: 100 QT Interval:  406 QTC Calculation: 451 R Axis:   -8 Text Interpretation: Sinus rhythm Abnormal R-wave progression, early transition Borderline T abnormalities, anterior leads No significant change since last tracing Confirmed by Gareth Morgan [phone]) on 10/25/2019 5:39:10 PM   Radiology DG Chest Port 1 View  Result Date: 10/25/2019 CLINICAL DATA:  Shortness of breath.  Fever.  Coronavirus exposure. EXAM: PORTABLE CHEST 1 VIEW COMPARISON:  10/17/2019 FINDINGS: Heart size remains normal. Aortic atherosclerosis as seen previously. Slight worsening atelectasis and or mild infiltrate at both lung bases. Upper lungs remain clear radiographically. No visible effusion. No acute bone finding. IMPRESSION: Slight worsening atelectasis or mild infiltrate at both lung bases. Electronically Signed   By: Nelson Chimes M.D.   On: 10/25/2019 17:50    Procedures Procedures (including critical care time)  Medications Ordered in ED Medications  vancomycin (VANCOREADY) IVPB 1500 mg/300 mL (1,500 mg Intravenous New Bag/Given 10/25/19 1826)  ceFEPIme (MAXIPIME) 2 g in sodium chloride 0.9 % 100 mL IVPB (has no  administration in time range)  vancomycin (VANCOCIN) IVPB 1000 mg/200 mL premix (has no administration in time range)  metoprolol tartrate (LOPRESSOR) tablet 50 mg (has no administration in time range)  predniSONE (DELTASONE) tablet 10 mg (has no administration in time range)  hydrocortisone sodium succinate (SOLU-CORTEF) 100 MG injection 50 mg (has no administration in time range)  saccharomyces boulardii (FLORASTOR) capsule 250 mg (has no administration in time range)  enoxaparin (LOVENOX) injection 40 mg (has no administration in time range)  0.9 %  sodium chloride infusion (has no administration in time range)  acetaminophen (TYLENOL) tablet 650 mg (has no administration in time range)    Or  acetaminophen (TYLENOL) suppository 650 mg (has no administration in time range)  ondansetron (ZOFRAN) tablet 4 mg (has no administration in time range)    Or  ondansetron (ZOFRAN) injection 4 mg (has no administration in time range)  ceFEPIme (MAXIPIME) 2 g in sodium chloride 0.9 % 100 mL IVPB (2 g Intravenous New Bag/Given 10/25/19 1829)  lactated ringers bolus 1,000 mL (1,000 mLs Intravenous New Bag/Given 10/25/19 1819)    And  lactated ringers bolus 1,000 mL (1,000 mLs Intravenous New Bag/Given  10/25/19 1828)    ED Course  I have reviewed the triage vital signs and the nursing notes.  Pertinent labs & imaging results that were available during my care of the patient were reviewed by me and considered in my medical decision making (see chart for details).    MDM Rules/Calculators/A&P                          Betty Jackson a 85 y.o.femalewith PMH significant for HTN, HLD, CAD, paroxysmal A. fib not on anticoagulation because of falls, CVA, chronic diastolic CHF, CKD, chronic anemia, chronic low back pain, rheumatoid arthritis, giant cell arteritis, diverticulosis, GERD, hemorrhoids, anxiety recent hospitalizations for various problems includingC. Difficile, diverticulitis, GI bleed, hand  cellulitis, right femur fracture s/p ORIF in June followed by repeat fall and fracture requiring repeat right ORIF with Dr. Marcelino Scot 7/20 discharged to [facility] 2 days ago who presents with concern for fever with CXR found on XR and noted tachycardia and AMS at facility.  Labs, XR from facility reviewed. Reported fever of 102 and HR to 120s with leukocytosis, CXR showing left sided pneumonia.  Given this, have concern for sepsis secondary to pneumonia.  UA pending at facility.  Do not feel presentation consistent with meningitis, intraabdominal etiology of symptoms.  Will repeat labs here. Plan on admission for HCAP. Received IM ertapenem at facility. Will give vanc/cefepime here and 30cc/kg LR.  Labs show Cr 1.23 from .7. Lactate WNL.  Will admit for continued care.   Final Clinical Impression(s) / ED Diagnoses Final diagnoses:  Sepsis with encephalopathy without septic shock, due to unspecified organism [facility])  Pneumonia of left lower lobe due to infectious organism    Rx / DC Orders ED Discharge Orders    None       Gareth Morgan, MD 10/25/19 2021

## 2019-10-25 NOTE — H&P (Signed)
[facility] History and Physical  SHELINA LUO JKK:[id number] DOB: 02-Nov-1934 DOA: 10/25/2019   PCP: Eulas Post, MD  Specialists: None  Chief Complaint: Fever at the skilled nursing facility  HPI: Betty Jackson is a 85 y.o. 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 difficile who was hospitalized twice in the last month or so for 2 different fractures to her right femur.  She underwent ORIF on 7/20.  She was given blood transfusion during the previous hospitalization.  She was discharged to skilled nursing facility on 7/26.  Patient was brought back to the emergency department today due to presence of fever.  She was also noted to be lethargic at the skilled nursing facility.  Patient unable to provide much information.  Most of the history was obtained from the ED provider as well as patient's daughter who was at the bedside.  Patient does answer a few questions in monosyllables.  She denies any chest pain.   Apparently she has had very poor oral intake in the last 48 hours.  She was also complaining of nausea.  It is unclear if she had vomiting.  No recent diarrhea.  Has not complained of any headaches.  In the emergency department she was found to be lethargic but arousable, had leukocytosis, was apparently febrile at 76 F though it is not been recorded, heart rate was normal, mildly tachypneic.  Chest x-ray suggested pneumonia.  She was thought to have sepsis.  She will need hospitalization for further management.  Home Medications: Prior to Admission medications   Medication Sig Start Date End Date Taking? Authorizing Provider  ALPRAZolam Duanne Moron) 0.5 MG tablet Take 1 tablet (0.5 mg total) by mouth at bedtime  as needed for sleep. 10/23/19  Yes Dwyane Dee, MD  azithromycin (ZITHROMAX) 500 MG tablet Take 500 mg by mouth daily. 10/25/19  Yes [provider]  cholecalciferol (VITAMIN D) 25 MCG tablet Take 2 tablets (2,000 Units total) by mouth 2 (two) times daily. 09/29/19  Yes Ainsley Spinner, PA-C  diltiazem (CARDIZEM CD) 240 MG 24 hr capsule Take 1 capsule (240 mg total) by mouth daily. 07/19/19  Yes Bhagat, Bhavinkumar, PA  enoxaparin (LOVENOX) 30 MG/0.3ML injection Inject 0.3 mLs (30 mg total) into the skin daily. 10/21/19 11/20/19 Yes Ainsley Spinner, PA-C  escitalopram (LEXAPRO) 10 MG tablet TAKE 1 TABLET BY MOUTH EVERY DAY Patient taking differently: Take 10 mg by mouth daily.  12/14/18  Yes Burchette, Alinda Sierras, MD  famotidine (PEPCID) 20 MG tablet Take 1 tablet (20 mg total) by mouth daily. 06/02/19  Yes Eugenie Filler, MD  fentaNYL (DURAGESIC) 12 MCG/HR Place 1 patch onto the skin every 3 (three) days. 09/29/19  Yes Ghimire, Henreitta Leber, MD  ferrous sulfate 325 (65 FE) MG tablet Take 1 tablet (325 mg total) by mouth 2 (two) times daily with a meal. 04/21/19  Yes Darrick Meigs, Marge Duncans, MD  furosemide (LASIX) 40 MG tablet Take 1 tablet (40 mg total) by mouth daily. 06/03/19  Yes Eugenie Filler, MD  HYDROcodone-acetaminophen [facility]) 10-325 MG tablet Take one tablet by mouth every 8 hours as needed for breakthrough pain. 10/23/19  Yes Dwyane Dee, MD  LACTOBACILLUS PO Take 2 capsules by mouth 3 (three) times daily.   Yes  [provider]  metoprolol tartrate (LOPRESSOR) 50 MG tablet Take 50 mg by mouth 2 (two) times daily.   Yes [provider]  Multiple Vitamin (MULTIVITAMIN WITH MINERALS) TABS tablet Take 1 tablet by mouth daily. 10/21/19  Yes Ainsley Spinner, PA-C  potassium chloride SA (KLOR-CON) 10 MEQ tablet Take 1 tablet (10 mEq total) by mouth daily. 04/21/19  Yes Oswald Hillock, MD  predniSONE (DELTASONE) 10 MG tablet Take 1 tablet (10 mg total) by mouth daily with breakfast. 10/23/19  Yes  Dwyane Dee, MD  senna-docusate (SENOKOT-S) 8.6-50 MG tablet Take 1 tablet by mouth at bedtime.   Yes [provider]  vitamin C (ASCORBIC ACID) 500 MG tablet Take 1,000 mg by mouth daily.    Yes [provider]  vitamin E (VITAMIN E) 400 UNIT capsule Take 400 Units by mouth daily.   Yes [provider]    Allergies:  Allergies  Allergen Reactions  . Dextromethorphan Rash    Past Medical History: Past Medical History:  Diagnosis Date  . Acute respiratory failure with hypoxia ([place]) 05/20/2017  . Anemia, unspecified 10/28/2012  . Anxiety state, unspecified 10/28/2012  . CAD (coronary artery disease)    a. Stent RCA in [facility];  b. Cath approx 2009 - nonobs per pt report.  . Cataract    immature on the left eye  . Chronic insomnia 02/07/2013  . Chronic lower back pain    scoliosis  . CKD (chronic kidney disease) stage 3, GFR 30-59 ml/min   . CVA (cerebral infarction) 10/29/2012  . DDD (degenerative disc disease)   . Depression   . Diverticulosis   . GERD (gastroesophageal reflux disease) 09/01/2010  . Hemorrhoids   . Herniated nucleus pulposus, L5-S1, right 11/04/2015  . Hyperlipidemia    takes Lipitor daily  . Hypertension    takes Amlodipine,Losartan,Metoprolol,and HCTZ daily  . Incontinence of urine   . Insomnia    takes Restoril nightly  . Lumbar stenosis 04/25/2013  . Major depressive disorder, recurrent episode, moderate ([place]) 07/16/2013  . Osteoporosis   . PAF (paroxysmal atrial fibrillation) ([place]) 2011   a. lone epidode in 2011 according to notes.  . Rheumatoid arthritis ([place])   . Scoliosis   . Sepsis ([place]) 05/2019  . Temporal arteritis ([place]) 2011   a. followed @ [facility]; potential flareup 10/28/2012/notes 10/28/2012  . Vocal cord dysfunction    "they don't operate properly" (10/28/2012)    Past Surgical History:  Procedure Laterality Date  . ABDOMINAL HYSTERECTOMY  ~ 1984   vaginally  . BACK SURGERY  7-45yrs ago   X Stop  . BLADDER  SUSPENSION  2001  . BREAST BIOPSY Right   . CATARACT EXTRACTION W/ INTRAOCULAR LENS IMPLANT Right ~ 08/2012  . CHEST TUBE INSERTION Left 03/08/2019   Procedure: Chest Tube Insertion;  Surgeon: Ivin Poot, MD;  Location: [place];  Service: Thoracic;  Laterality: Left;  . COLONOSCOPY  01/26/2012   Procedure: COLONOSCOPY;  Surgeon: Ladene Artist, MD,FACG;  Location: [facility] ENDOSCOPY;  Service: Endoscopy;  Laterality: N/A;  note the EGD is possible  . CORONARY ANGIOPLASTY WITH STENT PLACEMENT  2006   X 1 stent  . EPIDURAL BLOCK INJECTION    . ESOPHAGOGASTRODUODENOSCOPY  01/26/2012   Procedure: ESOPHAGOGASTRODUODENOSCOPY (EGD);  Surgeon: Ladene Artist, MD,FACG;  Location: [facility] ENDOSCOPY;  Service: Endoscopy;  Laterality: N/A;  . FINE NEEDLE ASPIRATION Right 09/26/2019   Procedure: FINE NEEDLE ASPIRATION;  Surgeon: Altamese Noblestown, MD;  Location: [facility]  OR;  Service: Orthopedics;  Laterality: Right;  . HEMIARTHROPLASTY HIP Right 2012  . IR EPIDUROGRAPHY  04/20/2019  . LAPAROSCOPIC CHOLECYSTECTOMY  2001  . LUMBAR FUSION  03/2013  . LUMBAR LAMINECTOMY/DECOMPRESSION MICRODISCECTOMY Right 11/04/2015   Procedure: Right Lumbar Five-Sacral One Microdiskectomy;  Surgeon: Kristeen Miss, MD;  Location: [place] NEURO ORS;  Service: Neurosurgery;  Laterality: Right;  Right L5-S1 Microdiskectomy  . moles removed that required stiches     one on leg and one on face  . ORIF FEMUR FRACTURE Right 09/26/2019   Procedure: OPEN REDUCTION INTERNAL FIXATION (ORIF) DISTAL FEMUR FRACTURE;  Surgeon: Altamese Murray, MD;  Location: [place];  Service: Orthopedics;  Laterality: Right;  . ORIF FEMUR FRACTURE Right 10/17/2019   Procedure: OPEN REDUCTION INTERNAL FIXATION (ORIF) DISTAL FEMUR FRACTURE;  Surgeon: Altamese Holland, MD;  Location: [place];  Service: Orthopedics;  Laterality: Right;  . SUBXYPHOID PERICARDIAL WINDOW N/A 03/08/2019   Procedure: SUBXYPHOID PERICARDIAL WINDOW;  Surgeon: Ivin Poot, MD;  Location: [place];  Service:  Thoracic;  Laterality: N/A;  . TEE WITHOUT CARDIOVERSION N/A 03/08/2019   Procedure: TRANSESOPHAGEAL ECHOCARDIOGRAM (TEE);  Surgeon: Prescott Gum, Collier Salina, MD;  Location: [place];  Service: Thoracic;  Laterality: N/A;  . TEMPORAL ARTERY BIOPSY / LIGATION Bilateral 2011  . TONSILLECTOMY AND ADENOIDECTOMY     at age 7  . TUBAL LIGATION  ~ 1982  . X-STOP IMPLANTATION  ~ 2010   "lower back" (10/28/2012)    Social History: Hospitalized twice in the last 2 months for femur fractures.  Currently in a skilled nursing facility.  No history of alcohol use, smoking or illicit drug use.  Family History:  Family History  Problem Relation Age of Onset  . Brain cancer Mother 16  . Heart attack Father   . Breast cancer Daughter 16  . Heart disease Other   . Hypertension Other   . Arthritis Neg Hx   . Colon cancer Neg Hx   . Osteoporosis Neg Hx      Review of Systems -unable to do due to her lethargy  Physical Examination  Vitals:   10/25/19 1700 10/25/19 1800 10/25/19 1900 10/25/19 1930  BP: (!) 132/109 (!) 139/57    Pulse: 74 77 84 86  Resp: (!) 24 (!) 24 22 (!) 25  Temp:      TempSrc:      SpO2:      Weight:      Height:        BP (!) 139/57   Pulse 86   Temp 98.5 F (36.9 C) (Oral)   Resp (!) 25   Ht 5\' 4"  (1.626 m)   Wt 68 kg   SpO2 96%   BMI 25.75 kg/m   General appearance: fatigued, no distress and Easily arousable Head: Normocephalic, without obvious abnormality, atraumatic Eyes: conjunctivae/corneas clear. PERRL, EOM's intact.  Throat: lips, mucosa, and tongue normal; teeth and gums normal Neck: no adenopathy, no carotid bruit, no JVD, supple, symmetrical, trachea midline and thyroid not enlarged, symmetric, no tenderness/mass/nodules Resp: Diminished air entry at the bases with few crackles.  No rhonchi.  No wheezing.  Mildly tachypneic. Cardio: regular rate and rhythm, S1, S2 normal, no murmur, click, rub or gallop GI: soft, non-tender; bowel sounds normal; no masses,  no  organomegaly Extremities: extremities normal, atraumatic, no cyanosis or edema Pulses: 2+ and symmetric Skin: Skin color, texture, turgor normal. No rashes or lesions Lymph nodes: Cervical, supraclavicular, and axillary nodes normal. Neurologic: Somnolent but easily arousable.  Moving all her extremities.  No focal deficits noted. Incision/Wound: Incision site on the right thigh laterally appears to be clean.  No evidence of erythema.  No bruising.    Labs on Admission: I have personally reviewed following labs and imaging studies  CBC: Recent Labs  Lab 10/19/19 0210 10/19/19 0210 10/19/19 1204 10/20/19 0156 10/21/19 0311 10/22/19 0302 10/25/19 1745  WBC 12.1*  --   --  12.4* 7.9 9.4 15.8*  NEUTROABS  --   --   --   --  5.1 6.2 12.1*  HGB 6.4*   < > 6.5* 10.5* 10.4* 11.1* 11.3*  HCT 21.0*   < > 21.1* 32.5* 32.2* 34.4* 35.6*  MCV 95.9  --   --  90.5 92.0 91.0 93.4  PLT 150  --   --  194 194 222 338   < > = values in this interval not displayed.   Basic Metabolic Panel: Recent Labs  Lab 10/21/19 0311 10/22/19 0302 10/25/19 1745  NA 138 138 135  K 3.4* 3.2* 3.6  CL 99 98 97*  CO2 31 30 26   GLUCOSE 95 99 109*  BUN 10 9 13   CREATININE 0.75 0.72 1.23*  CALCIUM 8.5* 8.7* 8.6*  MG 1.7 1.6*  --    GFR: Estimated Creatinine Clearance: 32.8 mL/min (A) (by C-G formula based on SCr of 1.23 mg/dL (H)). Liver Function Tests: Recent Labs  Lab 10/25/19 1745  AST 22  ALT 19  ALKPHOS 146*  BILITOT 2.4*  PROT 6.2*  ALBUMIN 2.6*   Coagulation Profile: Recent Labs  Lab 10/25/19 1745  INR 1.3*     Radiological Exams on Admission: DG Chest Port 1 View  Result Date: 10/25/2019 CLINICAL DATA:  Shortness of breath.  Fever.  Coronavirus exposure. EXAM: PORTABLE CHEST 1 VIEW COMPARISON:  10/17/2019 FINDINGS: Heart size remains normal. Aortic atherosclerosis as seen previously. Slight worsening atelectasis and or mild infiltrate at both lung bases. Upper lungs remain clear  radiographically. No visible effusion. No acute bone finding. IMPRESSION: Slight worsening atelectasis or mild infiltrate at both lung bases. Electronically Signed   By: Nelson Chimes M.D.   On: 10/25/2019 17:50    My interpretation of Electrocardiogram: Sinus rhythm at 74 bpm.  Normal axis.  Perhaps first-degree AV block is present.  Nonspecific T wave changes.  No concerning ST segment changes.   Problem List  Principal Problem:   HCAP (healthcare-associated pneumonia) Active Problems:   Hyperlipidemia   CAD (coronary artery disease)   CKD (chronic kidney disease), stage III   PAF (paroxysmal atrial fibrillation) (HCC)   HTN (hypertension)   Giant cell arteritis (HCC)   Chronic diastolic CHF (congestive heart failure) ([place])   Assessment: This is a 85 year old female with a past medical history as stated earlier who was recently discharged from this facility on 7/26 to skilled nursing facility after she was managed for a femur fracture.  Comes back in with fever, tachypnea, leukocytosis with concern for pneumonia on chest x-ray.  Lactic acid was initially normal and is slightly elevated now.  There is concern for sepsis due to pneumonia.  Plan: 1. Healthcare associated pneumonia versus aspiration pneumonia with sepsis, present on admission: Started on vancomycin and cefepime in the emergency department which will be continued.  Her COVID-19 test was negative on 7/26 when she was released from the hospital.  Repeat test done at the skilled nursing facility today was apparently negative but I do not have the report.  Another test has been  repeated in the hospital now.  Her presentation is not typical for pneumonia due to COVID-19.  Follow-up on cultures.  UA does not suggest infection.  Speech therapy consult will be placed as well.  2. Acute metabolic encephalopathy: Her lethargy somnolence is most likely due to her infection.  She does not have any focal neurological deficits.  She does  follow commands.  Continue to monitor.  No meningeal signs.  3. Acute on chronic kidney disease stage III: Her creatinine noted to be slightly higher today at 1.23.  It was 0.72 when she was discharged from the hospital.  Hydration is being continued.  4. Normocytic anemia: She was transfused during her previous hospitalizations.  Hemoglobin is stable.  5. Paroxysmal atrial fibrillation: Hold her Cardizem for now.  Resume Metoprolol from tomorrow.  Monitor on telemetry.  She is not on anticoagulation due to her multiple falls.  She is currently on DVT prophylaxis which will be continued.  6. Chronic diastolic CHF: Currently appears to be volume depleted.  She will be given IV fluids.  Echo done earlier this year in January showed normal systolic function.  Grade 2 diastolic dysfunction was noted.  7. Giant cell arteritis: On prednisone chronically.  Wait for her mentation to improve till we can resume her prednisone orally.  We will give her hydrocortisone intravenously for stress dosing for now.  8. History of coronary artery disease: Stable.  No evidence for any active ischemia currently.  9. Chronic pain syndrome: On fentanyl patch.  This has been removed due to her somnolence.  Can be reinitiated when she is more awake and alert.  10. Hospitalization in January for C. difficile: Currently does not have any diarrhea.  We will give her probiotics.  11. Goals of care: Patient has had 2 hospitalizations in the last 1 month.  Has also had multiple visits to the ED this year  for various reasons.  She was hospitalized earlier this year for C. difficile.  She was also admitted in March for pneumonia.  Her long-term prognosis seems to be guarded.  May not be unreasonable to involve palliative care to assist with goals of care.   DVT Prophylaxis: Lovenox Code Status: DNR.  Verified with patient's daughter Family Communication: Discussed with patient's daughter Disposition: Hopefully return back to  SNF when improved.  PT and OT evaluation Consults called: None Admission Status: Status is: Inpatient  Remains inpatient appropriate because:Altered mental status and IV treatments appropriate due to intensity of illness or inability to take PO   Dispo: The patient is from: SNF              Anticipated d/c is to: SNF              Anticipated d/c date is: 3 days              Patient currently is not medically stable to d/c.      Severity of Illness: The appropriate patient status for this patient is INPATIENT. Inpatient status is judged to be reasonable and necessary in order to provide the required intensity of service to ensure the patient's safety. The patient's presenting symptoms, physical exam findings, and initial radiographic and laboratory data in the context of their chronic comorbidities is felt to place them at high risk for further clinical deterioration. Furthermore, it is not anticipated that the patient will be medically stable for discharge from the hospital within 2 midnights of admission. The following factors support the patient status  of inpatient.   " The patient's presenting symptoms include altered mental status, fever. " The worrisome physical exam findings include confusion. " The initial radiographic and laboratory data are worrisome because of pneumonia. " The chronic co-morbidities include paroxysmal atrial fibrillation.   * I certify that at the point of admission it is my clinical judgment that the patient will require inpatient hospital care spanning beyond 2 midnights from the point of admission due to high intensity of service, high risk for further deterioration and high frequency of surveillance required.*    Further management decisions will depend on results of further testing and patient's response to treatment.   Aryahna Spagna [employer]  [facility] operational officer on [email or web address]  10/25/2019, 8:42 PM

## 2019-10-25 NOTE — ED Triage Notes (Signed)
PT here via [facility] from [facility] on [address] for decreased mental status.  Chest x-ray showed pnx.  Pt given Invanz this am with tylenol, which decreased her tachycardia and fever, however mental status did not improve.  PT normally talkative, feeds herself and is confused.  Presently pt answers yes or no questions with painful stimuli.  18 L AC.    BP 106/70 HR 78 nsr SATS 91% RA CBG 194 ET 32

## 2019-10-25 NOTE — Progress Notes (Signed)
Pharmacy Antibiotic Note  Betty Jackson is a 85 y.o. female admitted on 10/25/2019 with pneumonia.  Pharmacy has been consulted for Cefepime and Vancomycin dosing.   Height: 5\' 4"  (162.6 cm) Weight: 68 kg (150 lb) IBW/kg (Calculated) : 54.7  Temp (24hrs), Avg:98.5 F (36.9 C), Min:98.5 F (36.9 C), Max:98.5 F (36.9 C)  Recent Labs  Lab 10/19/19 0210 10/20/19 0156 10/21/19 0311 10/22/19 0302 10/25/19 1744 10/25/19 1745  WBC 12.1* 12.4* 7.9 9.4  --  15.8*  CREATININE  --   --  0.75 0.72  --  1.23*  LATICACIDVEN  --   --   --   --  1.6  --     Estimated Creatinine Clearance: 32.8 mL/min (A) (by C-G formula based on SCr of 1.23 mg/dL (H)).    Allergies  Allergen Reactions  . Dextromethorphan Rash    Antimicrobials this admission: 7/28 Cefepime >>  7/28 Vancomycin >>   Dose adjustments this admission: N/a  Microbiology results: Pending   Plan:  - Cefepime 2g IV q12h - Vancomycin 1500mg  IV x 1 dose  - Followed by Vancomycin 1000mg  IV q24h (nomogram dosing)  - Monitor patients renal function and urine output Scr elevated from baseline  - De-escalate ABX when appropriate   Thank you for allowing pharmacy to be a part of this patient's care.  Duanne Limerick PharmD. BCPS 10/25/2019 7:52 PM

## 2019-10-26 ENCOUNTER — Inpatient Hospital Stay (HOSPITAL_COMMUNITY): Payer: Medicare Other

## 2019-10-26 DIAGNOSIS — A419 Sepsis, unspecified organism: Principal | ICD-10-CM

## 2019-10-26 DIAGNOSIS — Z7189 Other specified counseling: Secondary | ICD-10-CM

## 2019-10-26 DIAGNOSIS — Z515 Encounter for palliative care: Secondary | ICD-10-CM

## 2019-10-26 DIAGNOSIS — I5032 Chronic diastolic (congestive) heart failure: Secondary | ICD-10-CM

## 2019-10-26 DIAGNOSIS — Z66 Do not resuscitate: Secondary | ICD-10-CM

## 2019-10-26 DIAGNOSIS — G9340 Encephalopathy, unspecified: Secondary | ICD-10-CM

## 2019-10-26 DIAGNOSIS — J189 Pneumonia, unspecified organism: Secondary | ICD-10-CM

## 2019-10-26 DIAGNOSIS — R531 Weakness: Secondary | ICD-10-CM

## 2019-10-26 DIAGNOSIS — N1831 Chronic kidney disease, stage 3a: Secondary | ICD-10-CM

## 2019-10-26 DIAGNOSIS — M316 Other giant cell arteritis: Secondary | ICD-10-CM

## 2019-10-26 DIAGNOSIS — R6520 Severe sepsis without septic shock: Secondary | ICD-10-CM

## 2019-10-26 DIAGNOSIS — I480 Paroxysmal atrial fibrillation: Secondary | ICD-10-CM

## 2019-10-26 LAB — COMPREHENSIVE METABOLIC PANEL
ALT: 16 U/L (ref 0–44)
AST: 17 U/L (ref 15–41)
Albumin: 2.1 g/dL — ABNORMAL LOW (ref 3.5–5.0)
Alkaline Phosphatase: 121 U/L (ref 38–126)
Anion gap: 12 (ref 5–15)
BUN: 10 mg/dL (ref 8–23)
CO2: 23 mmol/L (ref 22–32)
Calcium: 8.1 mg/dL — ABNORMAL LOW (ref 8.9–10.3)
Chloride: 103 mmol/L (ref 98–111)
Creatinine, Ser: 0.85 mg/dL (ref 0.44–1.00)
GFR calc Af Amer: >60 mL/min (ref >60)
GFR calc non Af Amer: >60 mL/min (ref >60)
Glucose, Bld: 121 mg/dL — ABNORMAL HIGH (ref 70–99)
Potassium: 3.9 mmol/L (ref 3.5–5.1)
Sodium: 138 mmol/L (ref 135–145)
Total Bilirubin: 1.6 mg/dL — ABNORMAL HIGH (ref 0.3–1.2)
Total Protein: 5.1 g/dL — ABNORMAL LOW (ref 6.5–8.1)

## 2019-10-26 LAB — MRSA PCR SCREENING: MRSA by PCR: POSITIVE — AB

## 2019-10-26 LAB — CBC
HCT: 30.1 % — ABNORMAL LOW (ref 36.0–46.0)
Hemoglobin: 9.9 g/dL — ABNORMAL LOW (ref 12.0–15.0)
MCH: 30.2 pg (ref 26.0–34.0)
MCHC: 32.9 g/dL (ref 30.0–36.0)
MCV: 91.8 fL (ref 80.0–100.0)
Platelets: 286 10*3/uL (ref 150–400)
RBC: 3.28 MIL/uL — ABNORMAL LOW (ref 3.87–5.11)
RDW: 14 % (ref 11.5–15.5)
WBC: 15.2 10*3/uL — ABNORMAL HIGH (ref 4.0–10.5)
nRBC: 0 % (ref 0.0–0.2)

## 2019-10-26 LAB — LACTIC ACID, PLASMA: Lactic Acid, Venous: 1.2 mmol/L (ref 0.5–1.9)

## 2019-10-26 LAB — URINE CULTURE: Culture: NO GROWTH

## 2019-10-26 MED ORDER — SODIUM CHLORIDE 0.9 % IV SOLN: INTRAVENOUS | Status: AC

## 2019-10-26 MED ORDER — VANCOMYCIN HCL 500 MG/100ML IV SOLN
500.0000 mg | Freq: Two times a day (BID) | INTRAVENOUS | Status: DC
  Administered 2019-10-26 – 2019-10-28 (×4): 500 mg via INTRAVENOUS
  Filled 2019-10-26 (×5): qty 100

## 2019-10-26 MED ORDER — ALPRAZOLAM 0.5 MG PO TABS
0.5000 mg | ORAL_TABLET | Freq: Every evening | ORAL | Status: DC | PRN
  Administered 2019-10-26 – 2019-10-29 (×3): 0.5 mg via ORAL
  Filled 2019-10-26 (×3): qty 1

## 2019-10-26 MED ORDER — FENTANYL 12 MCG/HR TD PT72
1.0000 | MEDICATED_PATCH | TRANSDERMAL | Status: DC
  Administered 2019-10-26 – 2019-10-29 (×2): 1 via TRANSDERMAL
  Filled 2019-10-26 (×2): qty 1

## 2019-10-26 MED ORDER — ESCITALOPRAM OXALATE 10 MG PO TABS
10.0000 mg | ORAL_TABLET | Freq: Every day | ORAL | Status: DC
  Administered 2019-10-26 – 2019-10-30 (×5): 10 mg via ORAL
  Filled 2019-10-26 (×5): qty 1

## 2019-10-26 MED ORDER — CHLORHEXIDINE GLUCONATE CLOTH 2 % EX PADS: 6.0000 | MEDICATED_PAD | Freq: Every day | CUTANEOUS | Status: DC

## 2019-10-26 MED ORDER — DILTIAZEM HCL ER COATED BEADS 240 MG PO CP24
240.0000 mg | ORAL_CAPSULE | Freq: Every day | ORAL | Status: DC
  Administered 2019-10-26 – 2019-10-30 (×5): 240 mg via ORAL
  Filled 2019-10-26 (×5): qty 1

## 2019-10-26 MED ORDER — HYDROCODONE-ACETAMINOPHEN 5-325 MG PO TABS
1.0000 | ORAL_TABLET | Freq: Once | ORAL | Status: AC
  Administered 2019-10-27: 1 via ORAL
  Filled 2019-10-26: qty 1

## 2019-10-26 MED ORDER — MUPIROCIN 2 % EX OINT
1.0000 "application " | TOPICAL_OINTMENT | Freq: Two times a day (BID) | CUTANEOUS | Status: DC
  Administered 2019-10-26 – 2019-10-30 (×9): 1 via NASAL
  Filled 2019-10-26 (×2): qty 22

## 2019-10-26 NOTE — TOC Initial Note (Signed)
Transition of Care [facility]) - Initial/Assessment Note    Patient Details  Name: Betty Jackson MRN: [id number] Date of Birth: 07-22-1934  Transition of Care [facility]) CM/SW Contact:    Trula Ore, [place] Phone Number: 10/26/2019, 3:43 PM  Clinical Narrative:                  CSW spoke with patient at bedside. Patient is agreeable to SNF placement. Patient says she came from [facility] in [place]. Patient was there for short term rehab. Patient wants to go back to [facility] for rehab. CSW called [facility] to confirm that they can take patient back for Short term rehab. [facility] confirmed that they can accept patient. [facility] told CSW to confirm with family that she will be coming back private pay. CSW called patients daughter [name]. Melissa confirmed that patient will go back to [facility] as private pay. CSW called [facility] to confirm wiith them that patients daughter agreed to private pay for patient. [facility] can accept patient for SNF placement.  Patient has SNF bed at [facility] in [place].  CSW will continue to follow.  Expected Discharge Plan: Skilled Nursing Facility Barriers to Discharge: Continued Medical Work up   Patient Goals and CMS Choice Patient states their goals for this hospitalization and ongoing recovery are:: to go to SNF CMS Medicare.gov Compare Post Acute Care list provided to:: Patient Choice offered to / list presented to : Patient  Expected Discharge Plan and Services Expected Discharge Plan: [place]                                              Prior Living Arrangements/Services     Patient language and need for interpreter reviewed:: Yes Do you feel safe going back to the place where you live?: No   SNF  Need for Family Participation in Patient Care: Yes (Comment) Care giver support system in place?: Yes (comment)   Criminal Activity/Legal Involvement Pertinent to Current Situation/Hospitalization: No - Comment as  needed  Activities of Daily Living Home Assistive Devices/Equipment: Environmental consultant (specify type), Wheelchair ADL Screening (condition at time of admission) Patient's cognitive ability adequate to safely complete daily activities?: No Is the patient deaf or have difficulty hearing?: No Does the patient have difficulty seeing, even when wearing glasses/contacts?: No Does the patient have difficulty concentrating, remembering, or making decisions?: Yes Patient able to express need for assistance with ADLs?: Yes Does the patient have difficulty dressing or bathing?: Yes Independently performs ADLs?: No Does the patient have difficulty walking or climbing stairs?: Yes Weakness of Legs: Both Weakness of Arms/Hands: Both  Permission Sought/Granted Permission sought to share information with : Case Manager, Family Supports, Chartered certified accountant granted to share information with : Yes, Verbal Permission Granted  Share Information with NAME: Melissa  Permission granted to share info w AGENCY: SNF  Permission granted to share info w Relationship: daughter  Permission granted to share info w Contact Information: [name] [phone]  Emotional Assessment Appearance:: Appears stated age Attitude/Demeanor/Rapport: Gracious Affect (typically observed): Calm Orientation: : Oriented to Self, Oriented to Place, Oriented to  Time Alcohol / Substance Use: Not Applicable Psych Involvement: No (comment)  Admission diagnosis:  HCAP (healthcare-associated pneumonia) [J18.9] Pneumonia of left lower lobe due to infectious organism [J18.9] Sepsis with encephalopathy without septic shock, due to unspecified organism ([place]) [A41.9, R65.20,  G93.40] Patient Active Problem List   Diagnosis Date Noted  . Pressure injury of skin 10/18/2019  . Closed fracture of right distal femur ([place]) 10/17/2019  . Femur fracture, right ([place]) 09/25/2019  . Chronic diastolic CHF (congestive heart failure) ([place])  09/25/2019  . HCAP (healthcare-associated pneumonia) 06/19/2019  . Encephalopathy 06/19/2019  . Lumbar degenerative disc disease   . Paroxysmal A-fib ([place])   . Hypomagnesemia   . C. difficile colitis   . Diarrhea 05/29/2019  . CHF (congestive heart failure) ([place]) 05/29/2019  . Recurrent falls 04/30/2019  . Pericardial effusion without cardiac tamponade 03/29/2019  . Hypoalbuminemia 03/27/2019  . Cardiac tamponade   . Pericarditis 03/06/2019  . Fever   . Septic shock ([place])   . Atypical chest pain   . Atrial fibrillation with rapid ventricular response ([place])   . Nausea 01/17/2019  . Acute diverticulitis 01/13/2019  . Chronic back pain 01/13/2019  . Tachycardia 01/13/2019  . Hypokalemia 01/13/2019  . Chronic low back pain 07/10/2018  . Influenza A 05/20/2017  . Sepsis ([place]) 05/20/2017  . Acute respiratory failure with hypoxia ([place]) 05/20/2017  . CKD (chronic kidney disease), stage III 05/20/2017  . PAF (paroxysmal atrial fibrillation) ([place]) 05/20/2017  . HTN (hypertension) 05/20/2017  . Giant cell arteritis ([place]) 05/20/2017  . Coronary disease 05/20/2017  . Abnormal urinalysis 05/20/2017  . Osteoporosis 05/08/2016  . Herniated nucleus pulposus, L5-S1, right 11/04/2015  . Major depressive disorder, recurrent episode, moderate ([place]) 07/16/2013  . Abdominal pain, epigastric 06/22/2013  . Lumbar stenosis 04/25/2013  . Chronic insomnia 02/07/2013  . Elevated transaminase level 02/07/2013  . CVA (cerebral infarction) 10/29/2012  . Visual changes 10/28/2012  . Depression 10/28/2012  . Anxiety state 10/28/2012  . Anemia, unspecified 10/28/2012  . Nonspecific (abnormal) findings on radiological and other examination of gastrointestinal tract 01/25/2012  . Hyponatremia 01/24/2012  . Hematuria 01/24/2012  . Enteritis 12/24/2011  . Abdominal pain, other specified site 12/24/2011  . Leg pain, right 02/18/2011  . Temporal arteritis ([place]) 09/01/2010  . Hypertension 09/01/2010  .  Hyperlipidemia 09/01/2010  . History of atrial fibrillation 09/01/2010  . CAD (coronary artery disease) 09/01/2010  . GERD (gastroesophageal reflux disease) 09/01/2010  . Insomnia 09/01/2010   PCP:  Eulas Post, MD Pharmacy:   [id number] - [place], [place] [address] Phone: [phone] Fax: [phone]     Social Determinants of Health (SDOH) Interventions    Readmission Risk Interventions Readmission Risk Prevention Plan 09/26/2019 06/21/2019 06/01/2019  Transportation Screening Complete Complete Complete  Medication Review Press photographer) Complete Complete Complete  PCP or Specialist appointment within 3-5 days of discharge Complete Not Complete Complete  PCP/Specialist Appt Not Complete comments - likely SNF placement -  [place] or Home Care Consult Complete Not Complete Complete  HRI or Home Care Consult Pt Refusal Comments - likely SNF -  SW Recovery Care/Counseling Consult Complete Complete Complete  Palliative Care Screening Complete Complete -  [place] Complete Complete Not Applicable  Some recent data might be hidden

## 2019-10-26 NOTE — Evaluation (Addendum)
Occupational Therapy Evaluation Patient Details Name: Betty Jackson MRN: [id number] DOB: 1934-09-15 Today's Date: 10/26/2019    History of Present Illness 85 y.o. female presenting with fever, nausea, lethargy and poor p.o  intake x 48hr with possible sepsis 2/2 pneumonia and acute metabolic encephalopathy. Patient with 2 recent hospital admissions after a fall ~6/21 at home resulting in distal R femur fx and again in July after a fall at [facility] resulting in recurrent closed R distal femur fx s/p ORIF 10/17/19 with NWB precautions and unrestricted R knee ROM. Patient d/c to SNF on 7/26. PMHx significant for A-fib, Hx of falls, CVA, chronic diastolic CHF, giant cell arthritis, chronic pain syndrome, and diverticulosis.    Clinical Impression   Patient with extensive medical history as detailed above currently demonstrating +2 assist for bed mobility, sit to stand transfers to [facility], and BADLs including bathing/dressing and toileting/hygiene/clothing management. Patient also limited by pain/swelling, Boutonniere's deformity, ulnar deviation of MCP joints, and Swan-neck deformity of fingers bilaterally making BADLs a hardship. Patient would benefit from continued acute OT services in prep for d/c to the next level of care with recommendation for return to SNF.   Follow Up Recommendations  SNF    Equipment Recommendations  None recommended by OT (Defer to next level of care)    Recommendations for Other Services       Precautions / Restrictions Precautions Precautions: Fall Restrictions Weight Bearing Restrictions: Yes RLE Weight Bearing: Non weight bearing Other Position/Activity Restrictions: Unrestricted R knee ROM      Mobility Bed Mobility Overal bed mobility: Needs Assistance Bed Mobility: Supine to Sit     Supine to sit: Max assist;+2 for physical assistance;HOB elevated     General bed mobility comments: Needed assist with bil LEs, right >left to come to EOB as well as incr assist  for trunk elevation. Had [facility] raised quite high as well.  Used helicopter technique with pad with pt needeing assist to come to EOB..  Transfers Overall transfer level: Needs assistance Equipment used: 2 person hand held assist Transfers: Sit to/from Stand;Squat Pivot Transfers;Stand Pivot Transfers Sit to Stand: Mod assist;+2 physical assistance;From elevated surface (3rd person to help with NWB right LE) Stand pivot transfers: Mod assist;+2 physical assistance;From elevated surface (3rd person to help with NWB right LE) Squat pivot transfers: Mod assist;+2 physical assistance;From elevated surface (3rd person to help with NWB right LE)     General transfer comment: Pt needed to get onto 3N1 to have BM.  Transferred to 3N1 with mod assist of 2 with assist by 3rd person to maintain NWB right LE. Pt performed partial stand pivot x 2 and squat to be cleaned.  Pt needs cues to maintain posture for transfers as well as constant cues to keep weight off of right LE.    Balance Overall balance assessment: Needs assistance Sitting-balance support: Bilateral upper extremity supported;Feet supported Sitting balance-[name]: Fair Sitting balance - Comments: can sit EOB with UE support   Standing balance support: Bilateral upper extremity supported;During functional activity Standing balance-[name]: Poor Standing balance comment: relies on +2 mod assist for partial stand                           ADL either performed or assessed with clinical judgement   ADL Overall ADL's : Needs assistance/impaired     Grooming: Moderate assistance;Sitting Grooming Details (indicate cue type and reason): Mod A 2/2 musculoskeletal deficits in bilateral hands  Toilet Transfer: Moderate assistance;+2 for physical assistance (+3 to maintain RLE NWB) Toilet Transfer Details (indicate cue type and reason): To BSC on L with use of RW and +2 assist.  Toileting- Clothing  Manipulation and Hygiene: Moderate assistance;+2 for physical assistance;Sit to/from stand Toileting - Clothing Manipulation Details (indicate cue type and reason): Mod A +2 for sit to stand from [facility] and +3 for hygiene/clothing management in standing.        General ADL Comments: Patient with pain and swelling in bilateral hands making BADLs hardship.     Vision Patient Visual Report: No change from baseline Vision Assessment?: No apparent visual deficits     Perception     Praxis      Pertinent Vitals/Pain Pain Assessment: Faces Pain Score: 10-Worst pain ever Faces Pain Scale: Hurts even more Pain Location: right hip Pain Descriptors / Indicators: Aching;Grimacing;Guarding Pain Intervention(s): Limited activity within patient's tolerance;Monitored during session;Repositioned     Hand Dominance Right   Extremity/Trunk Assessment Upper Extremity Assessment Upper Extremity Assessment: RUE deficits/detail;LUE deficits/detail RUE Deficits / Details: Generalized weakness with difficulty maintaining position in space against gravity. Boutonniere's deformity, ulnar deviation of MCP joints, and Swan-neck deformity of fingers bilaterally. Patient reports no dx. of RA.   RUE Sensation: WNL RUE Coordination: decreased fine motor;decreased gross motor LUE Sensation: WNL LUE Coordination: decreased fine motor;decreased gross motor   Lower Extremity Assessment Lower Extremity Assessment: Defer to PT evaluation RLE Deficits / Details: Femur fx s/p ORIF. Supine knee flexion to ~30 degrees, ankle dorsiflexion/plantarflexion 3/5, knee 2-/5, hip 2-/5       Communication Communication Communication: HOH   Cognition Arousal/Alertness: Awake/alert Behavior During Therapy: WFL for tasks assessed/performed Overall Cognitive Status: Impaired/Different from baseline Area of Impairment: Memory;Problem solving                 Orientation Level: Situation   Memory: Decreased short-term  memory;Decreased recall of precautions       Problem Solving: Slow processing General Comments: Pt oriented today x 3.  Pt followed 1 step commands well.    General Comments  Bil UE hand edema with soreness per pt in fingers and hands    Exercises Exercises: General Lower Extremity Total Joint Exercises Ankle Circles/Pumps: AROM;Both;10 reps;Supine Quad Sets: AROM;Both;10 reps;Supine Gluteal Sets: AROM;Both;10 reps;Supine Heel Slides: AAROM;5 reps;Right;Supine Straight Leg Raises: AAROM;Right;10 reps;Seated Long Arc Quad: AROM;Right;10 reps;Seated   Shoulder Instructions      Home Living Family/patient expects to be discharged to:: Skilled nursing facility                                 Additional Comments: Plan is to return to [facility]      Prior Functioning/Environment Level of Independence: Needs assistance  Gait / Transfers Assistance Needed: Prior to most recent admission, pt was a limited household ambulator with RW.  ADL's / Homemaking Assistance Needed: Requires assist for ADL's            OT Problem List: Decreased strength;Decreased range of motion;Decreased activity tolerance;Impaired balance (sitting and/or standing);Decreased coordination;Decreased safety awareness;Decreased knowledge of precautions;Pain      OT Treatment/Interventions: Self-care/ADL training;Therapeutic exercise;Energy conservation;DME and/or AE instruction;Therapeutic activities;Patient/family education;Balance training    OT Goals(Current goals can be found in the care plan section) Acute Rehab OT Goals Patient Stated Goal: to get stronger OT Goal Formulation: With patient Time For Goal Achievement: 11/09/19 Potential to Achieve Goals: Good ADL Goals Pt  Will Perform Grooming: with set-up;sitting Pt Will Perform Upper Body Bathing: sitting;with set-up Pt Will Perform Upper Body Dressing: with set-up;sitting Pt Will Transfer to Toilet: bedside commode;with mod  assist;stand pivot transfer;squat pivot transfer Pt Will Perform Toileting - Clothing Manipulation and hygiene: with mod assist;sit to/from stand;sitting/lateral leans  OT Frequency: Min 2X/week   Barriers to D/C:            Co-evaluation PT/OT/SLP Co-Evaluation/Treatment: Yes Reason for Co-Treatment: Complexity of the patient's impairments (multi-system involvement);For patient/therapist safety;To address functional/ADL transfers PT goals addressed during session: Mobility/safety with mobility OT goals addressed during session: ADL's and self-care      AM-PAC OT "6 Clicks" Daily Activity     Outcome Measure Help from another person eating meals?: A Lot Help from another person taking care of personal grooming?: A Lot Help from another person toileting, which includes using toliet, bedpan, or urinal?: A Lot Help from another person bathing (including washing, rinsing, drying)?: A Lot Help from another person to put on and taking off regular upper body clothing?: A Little Help from another person to put on and taking off regular lower body clothing?: Total 6 Click Score: 12   End of Session Equipment Utilized During Treatment: Gait belt  Activity Tolerance: Patient limited by pain Patient left: in chair;with call bell/phone within reach;with chair alarm set  OT Visit Diagnosis: Unsteadiness on feet (R26.81);Repeated falls (R29.6);Muscle weakness (generalized) (M62.81)                Time: 4932-4199 OT Time Calculation (min): 24 min Charges:  OT General Charges $OT Visit: 1 Visit OT Evaluation $OT Eval Moderate Complexity: 1 Mod  Linda Grimmer H. OTR/L Supplemental OT, Department of rehab services [phone]  Darly Massi R H. 10/26/2019, 12:34 PM

## 2019-10-26 NOTE — Progress Notes (Signed)
Modified Barium Swallow Progress Note  Patient Details  Name: [name] MRN: [id number] Date of Birth: [birth year]  Today's Date: 10/26/2019  Modified Barium Swallow completed.  Full report located under Chart Review in the Imaging Section.  Brief recommendations include the following:  Clinical Impression  Pt was seen in radiology suite for modified barium swallow study. Trials of puree solids, mixed consistency boluses (mechanical soft solids with thin liquids), regular texture solids, a 13 mm barium tablet, and thin liquids via cup and straw were administered. Pt's oropharyngeal swallow mechanism was within normal limits. Transient penetration (PAS 2) was noted with consecutive swallows of thin liquids, but this is considered to be within normal limits and no instances of aspiration were demonstrated. Considering pt's presentation at bedside, with coughing following consecutive swallows, SLP anticipates that depth of laryngeal invasion likely increases with sub-optimal positioning. It is recommended that a regular texture diet with thin liquids be initiated with observance of swallowing precautions. SLP will follow briefly to ensure tolerance of the diet and observance of precautions. However, further skilled SLP services will likely not be needed beyond that.    Swallow Evaluation Recommendations       SLP Diet Recommendations: Regular solids;Thin liquid   Liquid Administration via: Cup;Straw (individual sips)   Medication Administration: Whole meds with liquid   Supervision: Patient able to self feed   Compensations: Slow rate   Postural Changes: Seated upright at 90 degrees   Oral Care Recommendations: Oral care BID      Brayleigh Rybacki I. Hardin Negus, [place], [place] Office number [phone] Pager [place] 10/26/2019,2:23 PM

## 2019-10-26 NOTE — Evaluation (Signed)
Physical Therapy Evaluation Patient Details Name: Betty Jackson MRN: [id number] DOB: 07-13-1934 Today's Date: 10/26/2019   History of Present Illness  84 y.o. female presenting with fever, nausea, lethargy and poor p.o  intake x 48hr with possible sepsis 2/2 pneumonia and acute metabolic encephalopathy. Patient with 2 recent hospital admissions after a fall ~6/21 at home resulting in distal R femur fx and again in July after a fall at [facility] resulting in recurrent closed R distal femur fx s/p ORIF 10/17/19 with NWB precautions and unrestricted R knee ROM. Patient d/c to SNF on 7/26. PMHx significant for A-fib, Hx of falls, CVA, chronic diastolic CHF, giant cell arthritis, chronic pain syndrome, and diverticulosis.   Clinical Impression  Pt admitted with above diagnosis. Pt was able to pivot to 3N1 and then to  recliner with +3 assist - mod assist of 2 for partial stand pivot with 3rd person holding right LE in NWB right LE.  Pt giving more effort it seems than in previous sessions over last admit.  Pt very participatory and really wanted to get to 3N1. Will progress pt as able.  Pt currently with functional limitations due to the deficits listed below (see PT Problem List). Pt will benefit from skilled PT to increase their independence and safety with mobility to allow discharge to the venue listed below.      Follow Up Recommendations SNF;Supervision/Assistance - 24 hour    Equipment Recommendations  None recommended by PT    Recommendations for Other Services       Precautions / Restrictions Precautions Precautions: Fall Restrictions Weight Bearing Restrictions: Yes RLE Weight Bearing: Non weight bearing Other Position/Activity Restrictions: Unrestricted R knee ROM      Mobility  Bed Mobility Overal bed mobility: Needs Assistance Bed Mobility: Supine to Sit     Supine to sit: Max assist;+2 for physical assistance;HOB elevated     General bed mobility comments: Needed assist with bil  LEs, right >left to come to EOB as well as incr assist for trunk elevation. Had [facility] raised quite high as well.  Used helicopter technique with pad with pt needeing assist to come to EOB..  Transfers Overall transfer level: Needs assistance Equipment used: 2 person hand held assist Transfers: Sit to/from Stand;Squat Pivot Transfers;Stand Pivot Transfers Sit to Stand: Mod assist;+2 physical assistance;From elevated surface (3rd person to help with NWB right LE) Stand pivot transfers: Mod assist;+2 physical assistance;From elevated surface (3rd person to help with NWB right LE) Squat pivot transfers: Mod assist;+2 physical assistance;From elevated surface (3rd person to help with NWB right LE)     General transfer comment: Pt needed to get onto 3N1 to have BM.  Transferred to 3N1 with mod assist of 2 with assist by 3rd person to maintain NWB right LE. Pt performed partial stand pivot x 2 and squat to be cleaned.  Pt needs cues to maintain posture for transfers as well as constant cues to keep weight off of right LE.  Ambulation/Gait             General Gait Details: Unable at this time  Stairs            Wheelchair Mobility    Modified Rankin (Stroke Patients Only)       Balance Overall balance assessment: Needs assistance Sitting-balance support: Bilateral upper extremity supported;Feet supported Sitting balance-[name]: Fair Sitting balance - Comments: can sit EOB with UE support   Standing balance support: Bilateral upper extremity supported;During functional activity Standing  balance-[name]: Poor Standing balance comment: relies on +2 mod assist for partial stand                             Pertinent Vitals/Pain Pain Assessment: Faces Pain Score: 10-Worst pain ever Faces Pain Scale: Hurts even more Pain Location: right hip Pain Descriptors / Indicators: Aching;Grimacing;Guarding Pain Intervention(s): Limited activity within patient's  tolerance;Monitored during session;Repositioned    Home Living Family/patient expects to be discharged to:: Skilled nursing facility                 Additional Comments: Plan is to return to [facility]    Prior Function Level of Independence: Needs assistance   Gait / Transfers Assistance Needed: Prior to most recent admission, pt was a limited household ambulator with RW.   ADL's / Homemaking Assistance Needed: Requires assist for ADL's        Hand Dominance   Dominant Hand: Right    Extremity/Trunk Assessment   Upper Extremity Assessment Upper Extremity Assessment: RUE deficits/detail;LUE deficits/detail RUE Deficits / Details: (P) Generalized weakness with difficulty maintaining position in space against gravity. Boutonniere's deformity, ulnar deviation of MCP joints, and Swan-neck deformity of fingers bilaterally. Patient reports no dx. of RA.   RUE Sensation: (P) WNL RUE Coordination: (P) decreased fine motor;decreased gross motor LUE Sensation: (P) WNL LUE Coordination: (P) decreased fine motor;decreased gross motor    Lower Extremity Assessment Lower Extremity Assessment: (P) Defer to PT evaluation RLE Deficits / Details: Femur fx s/p ORIF. Supine knee flexion to ~30 degrees, ankle dorsiflexion/plantarflexion 3/5, knee 2-/5, hip 2-/5       Communication   Communication: HOH  Cognition Arousal/Alertness: Awake/alert Behavior During Therapy: WFL for tasks assessed/performed Overall Cognitive Status: Impaired/Different from baseline Area of Impairment: Memory;Problem solving                 Orientation Level: Situation   Memory: Decreased short-term memory;Decreased recall of precautions       Problem Solving: Slow processing General Comments: Pt oriented today x 3.  Pt followed 1 step commands well.       General Comments General comments (skin integrity, edema, etc.): Bil UE hand edema with soreness per pt in fingers and hands    Exercises  Total Joint Exercises Ankle Circles/Pumps: AROM;Both;10 reps;Supine Quad Sets: AROM;Both;10 reps;Supine Gluteal Sets: AROM;Both;10 reps;Supine Heel Slides: AAROM;5 reps;Right;Supine Straight Leg Raises: AAROM;Right;10 reps;Seated Long Arc Quad: AROM;Right;10 reps;Seated   Assessment/Plan    PT Assessment Patient needs continued PT services  PT Problem List Decreased strength;Decreased range of motion;Decreased balance;Decreased mobility;Decreased activity tolerance;Decreased knowledge of use of DME;Decreased knowledge of precautions;Decreased cognition;Pain       PT Treatment Interventions DME instruction;Functional mobility training;Therapeutic activities;Therapeutic exercise;Balance training;Patient/family education;Wheelchair mobility training    PT Goals (Current goals can be found in the Care Plan section)  Acute Rehab PT Goals Patient Stated Goal: to get stronger PT Goal Formulation: With patient Time For Goal Achievement: 11/09/19 Potential to Achieve Goals: Fair    Frequency Min 2X/week   Barriers to discharge Decreased caregiver support      Co-evaluation PT/OT/SLP Co-Evaluation/Treatment: Yes Reason for Co-Treatment: Complexity of the patient's impairments (multi-system involvement);For patient/therapist safety;To address functional/ADL transfers PT goals addressed during session: Mobility/safety with mobility OT goals addressed during session: ADL's and self-care       AM-PAC PT "6 Clicks" Mobility  Outcome Measure Help needed turning from your back to your side while  in a flat bed without using bedrails?: Total Help needed moving from lying on your back to sitting on the side of a flat bed without using bedrails?: Total Help needed moving to and from a bed to a chair (including a wheelchair)?: A Lot Help needed standing up from a chair using your arms (e.g., wheelchair or bedside chair)?: A Lot Help needed to walk in hospital room?: Total Help needed climbing  3-5 steps with a railing? : Total 6 Click Score: 8    End of Session Equipment Utilized During Treatment: Gait belt Activity Tolerance: Patient tolerated treatment well Patient left: in chair;with call bell/phone within reach;with chair alarm set Nurse Communication: Mobility status PT Visit Diagnosis: Other abnormalities of gait and mobility (R26.89);Pain Pain - Right/Left: Right Pain - part of body: Leg    Time: 1478-2956 PT Time Calculation (min) (ACUTE ONLY): 22 min   Charges:   PT Evaluation $PT Eval Moderate Complexity: 1 Mod          Aniylah Avans W,PT Acute Rehabilitation Services Pager:  [phone]  Office:  [phone]    Denice Paradise 10/26/2019, 12:21 PM

## 2019-10-26 NOTE — ED Notes (Signed)
Report called to floor

## 2019-10-26 NOTE — Progress Notes (Signed)
[facility] PROGRESS NOTE    Progress Note  Betty Jackson  ZGY:[id number] DOB: [birth year] DOA: 10/25/2019 PCP: Eulas Post, MD     Brief Narrative:   Betty Jackson is an 85 y.o. 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 difficile for which she was hospitalized twice.  Was been hospitalized twice in the last month for 2 different fractures of her right femur underwent ORIF in 10/17/2019 transfuse 1 unit of packed red blood cells and discharged to skilled nursing facility on 10/23/2019.  Comes back to the ED for fevers and being lethargic at skilled nursing facility.  Per report from the nursing home she has had poor oral intake over the last 48 hours in the ED she was found to have a temperature of 101 and mildly tachypneic chest x-ray shows pneumonia.  Assessment/Plan:   Sepsis due to HCAP (healthcare-associated pneumonia): Concerned about aspiration pneumonia versus healthcare pneumonia in the setting of sepsis. SARS-CoV-2 PCR is negative. She was started empirically on IV antibiotics, blood cultures and urine cultures were sent. Speech evaluation is pending. Palliative care has been consulted to address goals of care.  Acute metabolic encephalopathy: Multifactorial in the setting of dehydration infectious etiology, she seems to be nonfocal on physical exam. Her mentation is improved she is awake alert oriented x3.  Acute kidney injury on chronic kidney disease stage III: Baseline creatinine is less than 1, on admission 1.3 fluid resuscitated in the ED now her creatinine is returned to baseline.  Normocytic anemia: Hemoglobin seems to be stable compared to previous admissions.  Paroxysmal atrial fibrillation: Cardizem and metoprolol  were held on admission due to sepsis.  She is not on anticoagulation due to multiple falls. She is on DVT prophylaxis.   Now that her blood pressure is improved we will start her back on diltiazem and metoprolol. Her heart rate is trending up.  Chronic diastolic heart failure: She appears volume depleted on physical exam. Continue to hold her Lasix, monitor strict I's and O's and daily weights.  Giant cell arteritis: On chronic steroids, she does not appear to be in adrenal insufficiency her blood pressure and heart rate are stable blood glucose is unremarkable. She was given a shot of hydrocortisone intravenously in the ED we will transition her to her current home dose of prednisone.  CAD: Noted.  Chronic pain syndrome: This could be contributing to her encephalopathy in the setting of acute kidney injury. We will reinitiate once her mentation is improved.  Stage II sacral decubitus ulcer present on admission. RN Pressure Injury Documentation: Pressure Injury 10/17/19 Heel Right Stage 2 -  Partial thickness loss of dermis presenting as a shallow open injury with a red, pink wound bed without slough. (Active)  10/17/19 2100  Location: Heel  Location Orientation: Right  Staging: Stage 2 -  Partial thickness loss of dermis presenting as a shallow open injury with a red, pink wound bed without slough.  Wound Description (Comments):   Present on Admission: Yes    Estimated body mass index is 26.61 kg/m as calculated from the following:   Height as of this encounter: 5\' 4"  (1.626 m).   Weight as of this encounter: 70.3 kg.    DVT prophylaxis: lovenxo Family Communication:none Status is: Inpatient  Remains inpatient appropriate because:Hemodynamically  unstable   Dispo: The patient is from: SNF              Anticipated d/c is to: SNF              Anticipated d/c date is: 2 days              Patient currently is not medically stable to d/c.        Code Status:       Code Status Orders  (From admission, onward)         Start     Ordered   10/25/19 2014  Do not attempt resuscitation (DNR)  Continuous       Question Answer Comment  In the event of cardiac or respiratory ARREST Do not call a "code blue"   In the event of cardiac or respiratory ARREST Do not perform Intubation, CPR, defibrillation or ACLS   In the event of cardiac or respiratory ARREST Use medication by any route, position, wound care, and other measures to relive pain and suffering. May use oxygen, suction and manual treatment of airway obstruction as needed for comfort.      10/25/19 2013        Code Status History    Date Active Date Inactive Code Status Order ID Comments User Context   10/17/2019 0310 10/23/2019 2327 DNR [id number]  Etta Quill, DO ED   09/25/2019 2029 09/30/2019 0331 DNR [id number]  Toy Baker, MD Inpatient   06/19/2019 2346 06/28/2019 0212 DNR [id number]  Shela Leff, MD ED   06/19/2019 2346 06/19/2019 2346 Full Code [id number]  Shela Leff, MD ED   05/29/2019 0111 06/01/2019 2315 DNR [id number]  Shela Leff, MD ED   04/15/2019 1608 04/21/2019 1833 DNR [id number]  Lequita Halt, MD ED   03/09/2019 0828 03/13/2019 1649 DNR [id number]  Cristal Generous, NP Inpatient   03/08/2019 1225 03/09/2019 0828 Full Code [id number]  Ivin Poot, MD Inpatient   03/06/2019 1802 03/08/2019 1224 DNR [id number]  Candee Furbish, MD Inpatient   03/06/2019 1757 03/06/2019 1802 Full Code [id number]  Candee Furbish, MD Inpatient   01/13/2019 1734 01/20/2019 2138 DNR [id number]  Guilford Shi, MD ED   01/13/2019 1634 01/13/2019 1734 Full Code [id number]  Guilford Shi, MD ED   05/20/2017 1542 05/26/2017 2033 DNR [id number]  Samella Parr, NP ED   11/04/2015 1910 11/05/2015 1658 Full Code [id number]  Kristeen Miss, MD Inpatient   04/25/2013 2250 05/01/2013 1923 Full Code [id number]  Kristeen Miss, MD Inpatient   10/28/2012 1850 10/30/2012 1701 Full Code [id number]  Eugenie Filler, MD Inpatient   Advance Care Planning Activity    Advance Directive Documentation     Most Recent Value  Type of Advance Directive Healthcare Power of Attorney, Living will  Pre-existing out of facility DNR order (yellow form or pink MOST form) --  "MOST" Form in Place? --        IV Access:    Peripheral IV   Procedures and diagnostic studies:   DG Chest Port 1 View  Result Date: 10/25/2019 CLINICAL DATA:  Shortness of breath.  Fever.  Coronavirus exposure. EXAM: PORTABLE CHEST 1 VIEW COMPARISON:  10/17/2019 FINDINGS: Heart size remains normal. Aortic atherosclerosis as seen previously. Slight worsening atelectasis and or mild infiltrate at both lung bases. Upper lungs remain clear radiographically. No visible effusion. No acute bone finding. IMPRESSION: Slight worsening atelectasis or mild infiltrate at both lung bases. Electronically Signed  By: Nelson Chimes M.D.   On: 10/25/2019 17:50     Medical Consultants:    None.  Anti-Infectives:   IV vancomycin and cefepime  Subjective:    Cherre L Heyliger she relates she still feels tired, she knows she is in the hospital, she wants to go back to sleep.  Objective:    Vitals:   10/26/19 0028 10/26/19 0046 10/26/19 0140 10/26/19 0428  BP: (!) 143/63 (!) 142/61 (!) 156/65 (!) 145/67  Pulse: 97 96 94 94  Resp: 23 (!) 25 17 20   Temp:   99.2 F (37.3 C) 98 F (36.7 C)  TempSrc:   Oral Oral  SpO2: 92% 92% 97% 97%  Weight:   70.3 kg   Height:   5\' 4"  (1.626 m)    SpO2: 97 %   Intake/Output Summary (Last 24 hours) at 10/26/2019 0714 Last data filed at 10/26/2019 0600 Gross per 24 hour  Intake 487.71 ml  Output 251 ml  Net 236.71 ml   Filed Weights   10/25/19 1653 10/26/19 0140  Weight: 68 kg 70.3 kg    Exam: General exam: In no acute distress. Respiratory system: Good air movement and clear to auscultation. Cardiovascular system: S1 & S2 heard, RRR. No JVD. Gastrointestinal system: Abdomen is nondistended,  soft and nontender.  Central nervous system: Alert and oriented. No focal neurological deficits. Extremities: No pedal edema. Skin: No rashes, lesions or ulcers   Data Reviewed:    Labs: Basic Metabolic Panel: Recent Labs  Lab 10/21/19 0311 10/21/19 0311 10/22/19 0302 10/22/19 0302 10/25/19 1745 10/26/19 0432  NA 138  --  138  --  135 138  K 3.4*   < > 3.2*   < > 3.6 3.9  CL 99  --  98  --  97* 103  CO2 31  --  30  --  26 23  GLUCOSE 95  --  99  --  109* 121*  BUN 10  --  9  --  13 10  CREATININE 0.75  --  0.72  --  1.23* 0.85  CALCIUM 8.5*  --  8.7*  --  8.6* 8.1*  MG 1.7  --  1.6*  --   --   --    < > = values in this interval not displayed.   GFR Estimated Creatinine Clearance: 48.2 mL/min (by C-G formula based on SCr of 0.85 mg/dL). Liver Function Tests: Recent Labs  Lab 10/25/19 1745 10/26/19 0432  AST 22 17  ALT 19 16  ALKPHOS 146* 121  BILITOT 2.4* 1.6*  PROT 6.2* 5.1*  ALBUMIN 2.6* 2.1*   No results for input(s): LIPASE, AMYLASE in the last 168 hours. No results for input(s): AMMONIA in the last 168 hours. Coagulation profile Recent Labs  Lab 10/25/19 1745  INR 1.3*   COVID-19 Labs  No results for input(s): DDIMER, FERRITIN, LDH, CRP in the last 72 hours.  Lab Results  Component Value Date   [place] NEGATIVE 10/25/2019   [place] NEGATIVE 10/23/2019   [place] NEGATIVE 10/17/2019   [place] NEGATIVE 09/28/2019    CBC: Recent Labs  Lab 10/20/19 0156 10/21/19 0311 10/22/19 0302 10/25/19 1745 10/26/19 0432  WBC 12.4* 7.9 9.4 15.8* 15.2*  NEUTROABS  --  5.1 6.2 12.1*  --   HGB 10.5* 10.4* 11.1* 11.3* 9.9*  HCT 32.5* 32.2* 34.4* 35.6* 30.1*  MCV 90.5 92.0 91.0 93.4 91.8  PLT 194 194 222 338 286   Cardiac Enzymes: No results for  input(s): CKTOTAL, CKMB, CKMBINDEX, TROPONINI in the last 168 hours. BNP (last 3 results) No results for input(s): PROBNP in the last 8760 hours. CBG: No results for input(s): GLUCAP in the  last 168 hours. D-Dimer: No results for input(s): DDIMER in the last 72 hours. Hgb A1c: No results for input(s): HGBA1C in the last 72 hours. Lipid Profile: No results for input(s): CHOL, HDL, LDLCALC, TRIG, CHOLHDL, LDLDIRECT in the last 72 hours. Thyroid function studies: No results for input(s): TSH, T4TOTAL, T3FREE, THYROIDAB in the last 72 hours.  Invalid input(s): FREET3 Anemia work up: No results for input(s): VITAMINB12, FOLATE, FERRITIN, TIBC, IRON, RETICCTPCT in the last 72 hours. Sepsis Labs: Recent Labs  Lab 10/21/19 0311 10/22/19 0302 10/25/19 1744 10/25/19 1745 10/25/19 1944 10/26/19 0055 10/26/19 0432  WBC 7.9 9.4  --  15.8*  --   --  15.2*  LATICACIDVEN  --   --  1.6  --  2.0* 1.2  --    Microbiology Recent Results (from the past 240 hour(s))  SARS Coronavirus 2 by RT PCR (hospital order, performed in [facility] hospital lab) Nasopharyngeal Nasopharyngeal Swab     Status: None   Collection Time: 10/17/19  2:17 AM   Specimen: Nasopharyngeal Swab  Result Value Ref Range Status   SARS Coronavirus 2 NEGATIVE NEGATIVE Final    Comment: (NOTE) SARS-CoV-2 target nucleic acids are NOT DETECTED.  The SARS-CoV-2 RNA is generally detectable in upper and lower respiratory specimens during the acute phase of infection. The lowest concentration of SARS-CoV-2 viral copies this assay can detect is 250 copies / mL. A negative result does not preclude SARS-CoV-2 infection and should not be used as the sole basis for treatment or other patient management decisions.  A negative result may occur with improper specimen collection / handling, submission of specimen other than nasopharyngeal swab, presence of viral mutation(s) within the areas targeted by this assay, and inadequate number of viral copies (<250 copies / mL). A negative result must be combined with clinical observations, patient history, and epidemiological information.  Fact Sheet for Patients:     StrictlyIdeas.no  Fact Sheet for Healthcare Providers: BankingDealers.co.za  This test is not yet approved or  cleared by the [place] FDA and has been authorized for detection and/or diagnosis of SARS-CoV-2 by FDA under an Emergency Use Authorization (EUA).  This EUA will remain in effect (meaning this test can be used) for the duration of the COVID-19 declaration under Section 564(b)(1) of the Act, 21 U.S.C. section 360bbb-3(b)(1), unless the authorization is terminated or revoked sooner.  Performed at [facility], [place] [address], [place], [place] [address]   Surgical pcr screen     Status: None   Collection Time: 10/17/19  1:20 PM   Specimen: Nasal Mucosa; Nasal Swab  Result Value Ref Range Status   MRSA, PCR NEGATIVE NEGATIVE Final   Staphylococcus aureus NEGATIVE NEGATIVE Final    Comment: (NOTE) The Xpert SA Assay (FDA approved for NASAL specimens in patients 1 years of age and older), is one component of a comprehensive surveillance program. It is not intended to diagnose infection nor to guide or monitor treatment. Performed at [facility], [place] [address] [place], [place] [address]   SARS CORONAVIRUS 2 (TAT 6-24 HRS) Nasopharyngeal Nasopharyngeal Swab     Status: None   Collection Time: 10/23/19  3:42 AM   Specimen: Nasopharyngeal Swab  Result Value Ref Range Status   SARS Coronavirus 2 NEGATIVE NEGATIVE Final  Comment: (NOTE) SARS-CoV-2 target nucleic acids are NOT DETECTED.  The SARS-CoV-2 RNA is generally detectable in upper and lower respiratory specimens during the acute phase of infection. Negative results do not preclude SARS-CoV-2 infection, do not rule out co-infections with other pathogens, and should not be used as the sole basis for treatment or other patient management decisions. Negative results must be combined with clinical observations, patient history, and epidemiological  information. The expected result is Negative.  Fact Sheet for Patients: SugarRoll.be  Fact Sheet for Healthcare Providers: https://www.woods-mathews.com/  This test is not yet approved or cleared by the [place] FDA and  has been authorized for detection and/or diagnosis of SARS-CoV-2 by FDA under an Emergency Use Authorization (EUA). This EUA will remain  in effect (meaning this test can be used) for the duration of the COVID-19 declaration under Se ction 564(b)(1) of the Act, 21 U.S.C. section 360bbb-3(b)(1), unless the authorization is terminated or revoked sooner.  Performed at [facility], [place] [address], [place], [place] [address]   SARS Coronavirus 2 by RT PCR (hospital order, performed in [facility] hospital lab) Nasopharyngeal Nasopharyngeal Swab     Status: None   Collection Time: 10/25/19  8:31 PM   Specimen: Nasopharyngeal Swab  Result Value Ref Range Status   SARS Coronavirus 2 NEGATIVE NEGATIVE Final    Comment: (NOTE) SARS-CoV-2 target nucleic acids are NOT DETECTED.  The SARS-CoV-2 RNA is generally detectable in upper and lower respiratory specimens during the acute phase of infection. The lowest concentration of SARS-CoV-2 viral copies this assay can detect is 250 copies / mL. A negative result does not preclude SARS-CoV-2 infection and should not be used as the sole basis for treatment or other patient management decisions.  A negative result may occur with improper specimen collection / handling, submission of specimen other than nasopharyngeal swab, presence of viral mutation(s) within the areas targeted by this assay, and inadequate number of viral copies (<250 copies / mL). A negative result must be combined with clinical observations, patient history, and epidemiological information.  Fact Sheet for Patients:   StrictlyIdeas.no  Fact Sheet for Healthcare  Providers: BankingDealers.co.za  This test is not yet approved or  cleared by the [place] FDA and has been authorized for detection and/or diagnosis of SARS-CoV-2 by FDA under an Emergency Use Authorization (EUA).  This EUA will remain in effect (meaning this test can be used) for the duration of the COVID-19 declaration under Section 564(b)(1) of the Act, 21 U.S.C. section 360bbb-3(b)(1), unless the authorization is terminated or revoked sooner.  Performed at [facility], [place] [address], [place], [place] [address]      Medications:   . enoxaparin (LOVENOX) injection  40 mg Subcutaneous Q24H  . hydrocortisone sod succinate (SOLU-CORTEF) inj  50 mg Intravenous Q8H  . metoprolol tartrate  50 mg Oral BID  . predniSONE  10 mg Oral Q breakfast  . saccharomyces boulardii  250 mg Oral BID   Continuous Infusions: . sodium chloride 75 mL/hr at 10/25/19 2028  . ceFEPime (MAXIPIME) IV 2 g (10/26/19 0546)  . vancomycin        LOS: 1 day   Charlynne Cousins  [facility]  10/26/2019, 7:14 AM

## 2019-10-26 NOTE — Progress Notes (Signed)
RN spoke to and updated patient's daughters [name] and Threasa Beards.

## 2019-10-26 NOTE — Consult Note (Signed)
Consultation Note Date: 10/26/2019   Patient Name: Betty Jackson  DOB: Jan 16, 1935  MRN: [id number]  Age / Sex: 85 y.o., female  PCP: Eulas Post, MD Referring Physician: Aileen Fass, Tammi Klippel, MD  Reason for Consultation: Establishing goals of care  HPI/Patient Profile: 85 y.o. female  with past medical history of vocal cord dysfunction, CVA, CHF, giant cell arteritis on chronic prednisone, a.fib not on anticoagulation due to history of falls, chronic pain syndrome, recent hospitalizations for femur fractures,  admitted on 10/25/2019 with sepsis secondary to HCAP vs aspiration pneumonia and acute metabolic encephalopathy.   Discharged 10/23/19 to SNF after management for femur fracture. ORIF performed 10/17/19.  Patient and family face treatment option decisions, advanced directive decisions, and anticipatory care needs.   Clinical Assessment and Goals of Care: I have reviewed medical records including EPIC notes, labs and imaging. Received report from primary RN. RN had no acute concerns.   Went to visit patient at bedside - Emma/granddaughter was present. Patient was lying in bed awake, alert, oriented, and able to participate in conversation. RN placed fentanyl patch during visit for pain management.  Met with patient and Terrence Dupont at bedside to discuss diagnosis, prognosis, GOC, EOL wishes, disposition, and options.  I introduced Palliative Medicine as specialized medical care for people living with serious illness. It focuses on providing relief from the symptoms and stress of a serious illness. The goal is to improve quality of life for both the patient and the family.  Ms. Toothaker, prior to July, lived at home. Her husband passed away in 09-22-22, and since then she has lived alone; however, Terrence Dupont states the patient has had 24hr assistance from home health aids while she lived at home. The patient was able to walk  very short distances with the help of a walker and personal assistance - she also used a wheelchair when necessary. Ms. Losasso went to [facility] in [place] for rehab the first week of July, after her first fall that subsequently broke her femur. She is currently being seen by outpatient Palliative Care: [facility].  We discussed patient's current illness and what it means in the larger context of patient's on-going co-morbidities. The patient and family had a clear understanding of the patient's current medical condition. Natural disease trajectory and expectations at EOL were discussed. I attempted to elicit values and goals of care important to the patient. The difference between aggressive medical intervention and comfort care was considered in light of the patient's goals of care.   I introduced the concept of a comfort path/hospice to patient and family. Encouraged the patient to think about at what point she would want to stop aggressive medical interventions, rehospitalizations, and focus on helping her feel as best she can for as long as she can outside the hospital, if that were her goal. The patient and family were familiar with hospice services as they had been utilized for the patient's husband at end of life.   At this time, the  patient stated she wants to return to [facility] for rehab, but she wants to discuss the options listed above with her daughter/Melissa when she returns from out of town.  Advance directives, concepts specific to code status, artificial feeding and hydration, and rehospitalization were considered and discussed. MOST form was presented, reviewed, and discussed. Patient wanted to time to review and discuss further with her daughter/Melissa. Blank copy of MOST form was left at bedside.   Patient stated that she has HCPOA paperwork that lists her daughter/Melissa as HCPOA. Patient still wants her daughter to be her healthcare agent.    Toward the end of the visit, patient stated she was tired and wanted to rest.  Discussed with patient/family the importance of continued conversation with each other and the medical providers regarding overall plan of care and treatment options, ensuring decisions are within the context of the patient's values and GOCs.    Questions and concerns were addressed. The family was encouraged to call with questions or concerns. PMT card was provided.    PATIENT  Melissa Vogelsinger - daughter - is HCPOA per patient  SUMMARY OF RECOMMENDATIONS   -Continue current medical treatment -Continue DNR/DNI as previously documented -Patient at this time is wanting to return to [facility] to continue rehab when medically stable for discharge -Patient is already being followed by outpatient PC: [facility] form left at bedside. If patient becomes interested, please help her complete -Patient states her daughter/Melissa Vogelsinger is her HCPOA. Encouraged her to provide a copy if able. -RN paged stating patient was requesting PTA norco to be resumed. RN stated patient has still been lethargic today. One time dose norco 5/365m tablet PO for breakthrough pain ordered since fentanyl patch was just resumed. Instructed RN to start with tylenol since patient has still been lethargic. If the tylenol doesn't help with pain, can give one time dose norco -PMT will continue to follow holistically  Code Status/Advance Care Planning:  DNR  Palliative Prophylaxis:   Aspiration, Bowel Regimen, Delirium Protocol, Frequent Pain Assessment, Oral Care and Turn Reposition  Additional Recommendations (Limitations, Scope, Preferences):  Full Scope Treatment  Psycho-social/Spiritual:   Desire for further Chaplaincy support:no  Created space and opportunity for patient and family to express thoughts and feelings regarding patient's current medical situation.   Prognosis:    Unable to determine  [place]      Primary Diagnoses: Present on Admission: . HCAP (healthcare-associated pneumonia) . CAD (coronary artery disease) . Chronic diastolic CHF (congestive heart failure) ([place] . CKD (chronic kidney disease), stage III . Giant cell arteritis ([place] . Hyperlipidemia . PAF (paroxysmal atrial fibrillation) ([place] . HTN (hypertension)   I have reviewed the medical record, interviewed the patient and family, and examined the patient. The following aspects are pertinent.  Past Medical History:  Diagnosis Date  . Acute respiratory failure with hypoxia ([place] 05/20/2017  . Anemia, unspecified 10/28/2012  . Anxiety state, unspecified 10/28/2012  . CAD (coronary artery disease)    [facility]  b[facility] approx 2009 - nonobs per pt report.  . Cataract    immature on the left eye  . Chronic insomnia 02/07/2013  . Chronic lower back pain    scoliosis  . CKD (chronic kidney disease) stage 3, GFR 30-59 ml/min   . CVA (cerebral infarction) 10/29/2012  . DDD (degenerative disc disease)   . Depression   .  Diverticulosis   . GERD (gastroesophageal reflux disease) 09/01/2010  . Hemorrhoids   . Herniated nucleus pulposus, L5-S1, right 11/04/2015  . Hyperlipidemia    takes Lipitor daily  . Hypertension    takes Amlodipine,Losartan,Metoprolol,and HCTZ daily  . Incontinence of urine   . Insomnia    takes Restoril nightly  . Lumbar stenosis 04/25/2013  . Major depressive disorder, recurrent episode, moderate ([place]) 07/16/2013  . Osteoporosis   . PAF (paroxysmal atrial fibrillation) ([place]) 2011   a. lone epidode in 2011 according to notes.  . Rheumatoid arthritis ([place])   . Scoliosis   . Sepsis ([place]) 05/2019  . Temporal arteritis ([place]) 2011   a. followed @ [facility]; potential flareup 10/28/2012/notes 10/28/2012  . Vocal cord dysfunction    "they don't operate properly"  (10/28/2012)   Social History   Socioeconomic History  . Marital status: Married    Spouse name: Not on file  . Number of children: 3  . Years of education: Not on file  . Highest education level: Not on file  Occupational History  . Occupation: Retired Cabin crew  Tobacco Use  . Smoking status: Never Smoker  . Smokeless tobacco: Never Used  Vaping Use  . Vaping Use: Never used  Substance and Sexual Activity  . Alcohol use: Yes    Comment: socially  . Drug use: No  . Sexual activity: Not on file  Other Topics Concern  . Not on file  Social History Narrative   Lives in [place] with her husband.  Retired Cabin crew.   Social Determinants of Health   Financial Resource Strain:   . Difficulty of Paying Living Expenses:   Food Insecurity:   . Worried About Charity fundraiser in the Last Year:   . Arboriculturist in the Last Year:   Transportation Needs:   . Film/video editor (Medical):   Marland Kitchen Lack of Transportation (Non-Medical):   Physical Activity:   . Days of Exercise per Week:   . Minutes of Exercise per Session:   Stress:   . Feeling of Stress :   Social Connections:   . Frequency of Communication with Friends and Family:   . Frequency of Social Gatherings with Friends and Family:   . Attends Religious Services:   . Active Member of Clubs or Organizations:   . Attends Archivist Meetings:   Marland Kitchen Marital Status:    Family History  Problem Relation Age of Onset  . Brain cancer Mother 44  . Heart attack Father   . Breast cancer Daughter 70  . Heart disease Other   . Hypertension Other   . Arthritis Neg Hx   . Colon cancer Neg Hx   . Osteoporosis Neg Hx    Scheduled Meds: . [START ON 10/27/2019] Chlorhexidine Gluconate Cloth  6 each Topical Q0600  . diltiazem  240 mg Oral Daily  . enoxaparin (LOVENOX) injection  40 mg Subcutaneous Q24H  . escitalopram  10 mg Oral Daily  . fentaNYL  1 patch Transdermal Q72H  . metoprolol tartrate  50 mg Oral BID  .  mupirocin ointment  1 application Nasal BID  . predniSONE  10 mg Oral Q breakfast  . saccharomyces boulardii  250 mg Oral BID   Continuous Infusions: . sodium chloride 100 mL/hr at 10/26/19 0848  . ceFEPime (MAXIPIME) IV 2 g (10/26/19 0546)  . vancomycin 500 mg (10/26/19 1218)   PRN Meds:.acetaminophen **OR** acetaminophen, ALPRAZolam, ondansetron **OR** ondansetron (ZOFRAN) IV Medications Prior to  Admission:  Prior to Admission medications   Medication Sig Start Date End Date Taking? Authorizing Provider  ALPRAZolam Duanne Moron) 0.5 MG tablet Take 1 tablet (0.5 mg total) by mouth at bedtime as needed for sleep. 10/23/19  Yes Dwyane Dee, MD  azithromycin (ZITHROMAX) 500 MG tablet Take 500 mg by mouth daily. 10/25/19  Yes [provider]  cholecalciferol (VITAMIN D) 25 MCG tablet Take 2 tablets (2,000 Units total) by mouth 2 (two) times daily. 09/29/19  Yes Ainsley Spinner, PA-C  diltiazem (CARDIZEM CD) 240 MG 24 hr capsule Take 1 capsule (240 mg total) by mouth daily. 07/19/19  Yes Bhagat, Bhavinkumar, PA  enoxaparin (LOVENOX) 30 MG/0.3ML injection Inject 0.3 mLs (30 mg total) into the skin daily. 10/21/19 11/20/19 Yes Ainsley Spinner, PA-C  escitalopram (LEXAPRO) 10 MG tablet TAKE 1 TABLET BY MOUTH EVERY DAY Patient taking differently: Take 10 mg by mouth daily.  12/14/18  Yes Burchette, Alinda Sierras, MD  famotidine (PEPCID) 20 MG tablet Take 1 tablet (20 mg total) by mouth daily. 06/02/19  Yes Eugenie Filler, MD  fentaNYL (DURAGESIC) 12 MCG/HR Place 1 patch onto the skin every 3 (three) days. 09/29/19  Yes Ghimire, Henreitta Leber, MD  ferrous sulfate 325 (65 FE) MG tablet Take 1 tablet (325 mg total) by mouth 2 (two) times daily with a meal. 04/21/19  Yes Darrick Meigs, Marge Duncans, MD  furosemide (LASIX) 40 MG tablet Take 1 tablet (40 mg total) by mouth daily. 06/03/19  Yes Eugenie Filler, MD  HYDROcodone-acetaminophen [facility]) 10-325 MG tablet Take one tablet by mouth every 8 hours as needed for breakthrough pain.  10/23/19  Yes Dwyane Dee, MD  LACTOBACILLUS PO Take 2 capsules by mouth 3 (three) times daily.   Yes [provider]  metoprolol tartrate (LOPRESSOR) 50 MG tablet Take 50 mg by mouth 2 (two) times daily.   Yes [provider]  Multiple Vitamin (MULTIVITAMIN WITH MINERALS) TABS tablet Take 1 tablet by mouth daily. 10/21/19  Yes Ainsley Spinner, PA-C  potassium chloride SA (KLOR-CON) 10 MEQ tablet Take 1 tablet (10 mEq total) by mouth daily. 04/21/19  Yes Oswald Hillock, MD  predniSONE (DELTASONE) 10 MG tablet Take 1 tablet (10 mg total) by mouth daily with breakfast. 10/23/19  Yes Dwyane Dee, MD  senna-docusate (SENOKOT-S) 8.6-50 MG tablet Take 1 tablet by mouth at bedtime.   Yes [provider]  vitamin C (ASCORBIC ACID) 500 MG tablet Take 1,000 mg by mouth daily.    Yes [provider]  vitamin E (VITAMIN E) 400 UNIT capsule Take 400 Units by mouth daily.   Yes [provider]   Allergies  Allergen Reactions  . Dextromethorphan Rash   Review of Systems  Neurological: Positive for weakness.  All other systems reviewed and are negative.   Physical Exam Vitals and nursing note reviewed.  Constitutional:      General: She is not in acute distress. Pulmonary:     Effort: Pulmonary effort is normal. No respiratory distress.  Skin:    General: Skin is warm and dry.  Neurological:     Mental Status: She is alert.     Motor: Weakness present.  Psychiatric:        Behavior: Behavior is cooperative.        Thought Content: Thought content normal.        Cognition and Memory: Cognition normal.     Vital Signs: BP (!) 145/67 (BP Location: Right Arm)   Pulse 94  Temp 98 F (36.7 C) (Oral)   Resp 20   Ht '5\' 4"'  (1.626 m)   Wt 70.3 kg   SpO2 97%   BMI 26.61 kg/m  Pain Scale: 0-10   Pain Score: 0-No pain   SpO2: SpO2: 97 % O2 Device:SpO2: 97 % O2 Flow Rate: .   IO: Intake/output summary:   Intake/Output Summary (Last 24 hours) at  10/26/2019 1457 Last data filed at 10/26/2019 1400 Gross per 24 hour  Intake 727.71 ml  Output 253 ml  Net 474.71 ml    LBM:   Baseline Weight: Weight: 68 kg Most recent weight: Weight: 70.3 kg     Palliative Assessment/Data: PPS 30-40%   Discussed case with patient, granddaughter, Dr. Aileen Fass, primary RN  Time In: 1500 Time Out: 1610 Time Total: 70 minutes  Greater than 50%  of this time was spent counseling and coordinating care related to the above assessment and plan.  Signed by: Lin Landsman, NP   Please contact Palliative Medicine Team phone at [phone] for questions and concerns.  For individual provider: See Shea Evans

## 2019-10-26 NOTE — Evaluation (Addendum)
Clinical/Bedside Swallow Evaluation Patient Details  Name: Betty Jackson MRN: [id number] Date of Birth: 1934-05-19  Today's Date: 10/26/2019 Time: SLP Start Time (ACUTE ONLY): 1045 SLP Stop Time (ACUTE ONLY): 1102 SLP Time Calculation (min) (ACUTE ONLY): 17 min  Past Medical History:  Past Medical History:  Diagnosis Date  . Acute respiratory failure with hypoxia ([place]) 05/20/2017  . Anemia, unspecified 10/28/2012  . Anxiety state, unspecified 10/28/2012  . CAD (coronary artery disease)    a. Stent RCA in [facility];  b. Cath approx 2009 - nonobs per pt report.  . Cataract    immature on the left eye  . Chronic insomnia 02/07/2013  . Chronic lower back pain    scoliosis  . CKD (chronic kidney disease) stage 3, GFR 30-59 ml/min   . CVA (cerebral infarction) 10/29/2012  . DDD (degenerative disc disease)   . Depression   . Diverticulosis   . GERD (gastroesophageal reflux disease) 09/01/2010  . Hemorrhoids   . Herniated nucleus pulposus, L5-S1, right 11/04/2015  . Hyperlipidemia    takes Lipitor daily  . Hypertension    takes Amlodipine,Losartan,Metoprolol,and HCTZ daily  . Incontinence of urine   . Insomnia    takes Restoril nightly  . Lumbar stenosis 04/25/2013  . Major depressive disorder, recurrent episode, moderate ([place]) 07/16/2013  . Osteoporosis   . PAF (paroxysmal atrial fibrillation) ([place]) 2011   a. lone epidode in 2011 according to notes.  . Rheumatoid arthritis ([place])   . Scoliosis   . Sepsis ([place]) 05/2019  . Temporal arteritis ([place]) 2011   a. followed @ [facility]; potential flareup 10/28/2012/notes 10/28/2012  . Vocal cord dysfunction    "they don't operate properly" (10/28/2012)   Past Surgical History:  Past Surgical History:  Procedure Laterality Date  . ABDOMINAL HYSTERECTOMY  ~ 1984   vaginally  . BACK SURGERY  7-76yrs ago   X Stop  . BLADDER SUSPENSION  2001  . BREAST BIOPSY Right   . CATARACT EXTRACTION W/ INTRAOCULAR LENS IMPLANT Right ~ 08/2012  . CHEST TUBE  INSERTION Left 03/08/2019   Procedure: Chest Tube Insertion;  Surgeon: Ivin Poot, MD;  Location: [place];  Service: Thoracic;  Laterality: Left;  . COLONOSCOPY  01/26/2012   Procedure: COLONOSCOPY;  Surgeon: Ladene Artist, MD,FACG;  Location[facility] ENDOSCOPY;  Service: Endoscopy;  Laterality: N/A;  note the EGD is possible  . CORONARY ANGIOPLASTY WITH STENT PLACEMENT  2006   X 1 stent  . EPIDURAL BLOCK INJECTION    . ESOPHAGOGASTRODUODENOSCOPY  01/26/2012   Procedure: ESOPHAGOGASTRODUODENOSCOPY (EGD);  Surgeon: Ladene Artist, MD,FACG;  Location: [facility] ENDOSCOPY;  Service: Endoscopy;  Laterality: N/A;  . FINE NEEDLE ASPIRATION Right 09/26/2019   Procedure: FINE NEEDLE ASPIRATION;  Surgeon: Altamese Lodi, MD;  Location: [place];  Service: Orthopedics;  Laterality: Right;  . HEMIARTHROPLASTY HIP Right 2012  . IR EPIDUROGRAPHY  04/20/2019  . LAPAROSCOPIC CHOLECYSTECTOMY  2001  . LUMBAR FUSION  03/2013  . LUMBAR LAMINECTOMY/DECOMPRESSION MICRODISCECTOMY Right 11/04/2015   Procedure: Right Lumbar Five-Sacral One Microdiskectomy;  Surgeon: Kristeen Miss, MD;  Location: [place] NEURO ORS;  Service: Neurosurgery;  Laterality: Right;  Right L5-S1 Microdiskectomy  . moles removed that required stiches     one on leg and one on face  . ORIF FEMUR FRACTURE Right 09/26/2019   Procedure: OPEN REDUCTION INTERNAL FIXATION (ORIF) DISTAL FEMUR FRACTURE;  Surgeon: Altamese [place], MD;  Location: [place];  Service: Orthopedics;  Laterality: Right;  . ORIF FEMUR FRACTURE Right  10/17/2019   Procedure: OPEN REDUCTION INTERNAL FIXATION (ORIF) DISTAL FEMUR FRACTURE;  Surgeon: Altamese [place], MD;  Location: [place];  Service: Orthopedics;  Laterality: Right;  . SUBXYPHOID PERICARDIAL WINDOW N/A 03/08/2019   Procedure: SUBXYPHOID PERICARDIAL WINDOW;  Surgeon: Ivin Poot, MD;  Location: [place];  Service: Thoracic;  Laterality: N/A;  . TEE WITHOUT CARDIOVERSION N/A 03/08/2019   Procedure: TRANSESOPHAGEAL ECHOCARDIOGRAM (TEE);   Surgeon: Prescott Gum, Collier Salina, MD;  Location: [place];  Service: Thoracic;  Laterality: N/A;  . TEMPORAL ARTERY BIOPSY / LIGATION Bilateral 2011  . TONSILLECTOMY AND ADENOIDECTOMY     at age 73  . TUBAL LIGATION  ~ 1982  . X-STOP IMPLANTATION  ~ 2010   "lower back" (10/28/2012)   HPI:  Pt is an 85 y.o. 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  She underwent ORIF on 7/20. She was discharged to skilled nursing facility on 7/26 and returned to the ED due to presence of fever. CXR 7/28: Slight worsening atelectasis or mild infiltrate at both lung bases. BSE 09/27/19: normal oropharyngeal swallow    Assessment / Plan / Recommendation Clinical Impression  Pt was seen for bedside swallow evaluation. She reported that she was diagnosed with vocal fold dysfunction >20 years prior and initially denied any symptoms of oropharyngeal dysphagia. However, following her demonstrating symptoms during the evaluation, she stated that she occasionally coughs with thin liquids. Oral mechanism exam was [facility] and dentition was adequate. She presented with symptoms of pharyngeal dysphagia characterized by inconsistent throat clearing and coughing with thin liquids. A modified barium swallow study is recommended to further assess physiology and it is scheduled for today at 1330. Diet recommendations will be deferred until it is completed, but pt may have medications whole with puree or thin liquids.  SLP Visit Diagnosis: Dysphagia, pharyngeal phase (R13.13)    Aspiration Risk  Mild aspiration risk    Diet Recommendation Other (Comment) (TBD')   Medication Administration: Whole meds with liquid    Other  Recommendations Oral Care Recommendations: Oral care QID   Follow up  Recommendations Other (comment) (TBD)      Frequency and Duration min 2x/week  2 weeks       Prognosis Prognosis for Safe Diet Advancement: Good      Swallow Study   General Date of Onset: 10/25/19 HPI: Pt is an 85 y.o. 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  She underwent ORIF on 7/20. She was discharged to skilled nursing facility on 7/26 and returned to the ED due to presence of fever. CXR 7/28: Slight worsening atelectasis or mild infiltrate at both lung bases. BSE 09/27/19: normal oropharyngeal swallow  Type of Study: Bedside Swallow Evaluation Previous Swallow Assessment: None Diet Prior to this Study: NPO Temperature Spikes Noted: No Respiratory Status: Room air History of Recent Intubation: No Behavior/Cognition: Alert;Cooperative;Pleasant mood Oral Cavity Assessment: Dry Oral Care Completed by SLP: No Oral Cavity - Dentition: Adequate natural dentition;Missing dentition (missing mandibular molars) Vision: Functional for self-feeding Self-Feeding Abilities: Able to feed self Patient Positioning: Upright in  chair;Postural control adequate for testing Baseline Vocal Quality: Hoarse Volitional Cough: Strong Volitional Swallow: Able to elicit    Oral/Motor/Sensory Function Overall Oral Motor/Sensory Function: Within functional limits   Ice Chips Ice chips: Within functional limits Presentation: Spoon   Thin Liquid Thin Liquid: Impaired Presentation: Cup;Straw Pharyngeal  Phase Impairments: Throat Clearing - Immediate;Cough - Delayed (intermittent)    Nectar Thick Nectar Thick Liquid: Not tested   Honey Thick Honey Thick Liquid: Not tested   Puree Puree: Within functional limits Presentation: Spoon   Solid      Solid: Within functional limits Presentation: [place] I. Hardin Negus, [place], [place] Office number [phone] Pager [phone]  Horton Marshall 10/26/2019,12:10 PM

## 2019-10-26 NOTE — Progress Notes (Signed)
Pharmacy Antibiotic Note  Betty Jackson is a 85 y.o. female admitted on 10/25/2019 with pneumonia.  Pharmacy has been consulted for Cefepime and Vancomycin dosing.   Height: 5\' 4"  (162.6 cm) Weight: 70.3 kg (155 lb) IBW/kg (Calculated) : 54.7  Temp (24hrs), Avg:98.5 F (36.9 C), Min:98 F (36.7 C), Max:99.2 F (37.3 C)  Recent Labs  Lab 10/20/19 0156 10/21/19 0311 10/22/19 0302 10/25/19 1744 10/25/19 1745 10/25/19 1944 10/26/19 0055 10/26/19 0432  WBC 12.4* 7.9 9.4  --  15.8*  --   --  15.2*  CREATININE  --  0.75 0.72  --  1.23*  --   --  0.85  LATICACIDVEN  --   --   --  1.6  --  2.0* 1.2  --     Estimated Creatinine Clearance: 48.2 mL/min (by C-G formula based on SCr of 0.85 mg/dL).    Allergies  Allergen Reactions  . Dextromethorphan Rash    Antimicrobials this admission: 7/28 Cefepime >>  7/28 Vancomycin >>   Dose adjustments this admission: Vancomycin 1000mg  q24 changed to 500mg  q12h for improved renal function - calculated peak 35 / trough 21  Microbiology results: 7/28 CXR infiltrate, c/f PNA 7/28 BCx: pending 7/28 UCx: pending 7/28 COVID swab: negative  10/17/19 - MRSA PCR: neg  Plan:  - Cefepime 2g IV q12h - Vancomycin 500mg  IV q12h  - Monitor renal function and urine output - De-escalate ABX as appropriate   Thank you for allowing pharmacy to be a part of this patient's care.  Mercy Riding, PharmD PGY1 Acute Care Pharmacy Resident Please refer to [facility] for unit-specific pharmacist

## 2019-10-27 ENCOUNTER — Inpatient Hospital Stay (HOSPITAL_COMMUNITY): Payer: Medicare Other

## 2019-10-27 LAB — BASIC METABOLIC PANEL
Anion gap: 10 (ref 5–15)
BUN: 10 mg/dL (ref 8–23)
CO2: 22 mmol/L (ref 22–32)
Calcium: 8.4 mg/dL — ABNORMAL LOW (ref 8.9–10.3)
Chloride: 106 mmol/L (ref 98–111)
Creatinine, Ser: 0.72 mg/dL (ref 0.44–1.00)
GFR calc Af Amer: >60 mL/min (ref >60)
GFR calc non Af Amer: >60 mL/min (ref >60)
Glucose, Bld: 142 mg/dL — ABNORMAL HIGH (ref 70–99)
Potassium: 3 mmol/L — ABNORMAL LOW (ref 3.5–5.1)
Sodium: 138 mmol/L (ref 135–145)

## 2019-10-27 LAB — CBC WITH DIFFERENTIAL/PLATELET
Abs Immature Granulocytes: 0.11 10*3/uL — ABNORMAL HIGH (ref 0.00–0.07)
Basophils Absolute: 0 10*3/uL (ref 0.0–0.1)
Basophils Relative: 0 %
Eosinophils Absolute: 0 10*3/uL (ref 0.0–0.5)
Eosinophils Relative: 0 %
HCT: 28.2 % — ABNORMAL LOW (ref 36.0–46.0)
Hemoglobin: 9 g/dL — ABNORMAL LOW (ref 12.0–15.0)
Immature Granulocytes: 1 %
Lymphocytes Relative: 7 %
Lymphs Abs: 0.9 10*3/uL (ref 0.7–4.0)
MCH: 29.4 pg (ref 26.0–34.0)
MCHC: 31.9 g/dL (ref 30.0–36.0)
MCV: 92.2 fL (ref 80.0–100.0)
Monocytes Absolute: 0.8 10*3/uL (ref 0.1–1.0)
Monocytes Relative: 6 %
Neutro Abs: 11.2 10*3/uL — ABNORMAL HIGH (ref 1.7–7.7)
Neutrophils Relative %: 86 %
Platelets: 346 10*3/uL (ref 150–400)
RBC: 3.06 MIL/uL — ABNORMAL LOW (ref 3.87–5.11)
RDW: 13.7 % (ref 11.5–15.5)
WBC: 13.1 10*3/uL — ABNORMAL HIGH (ref 4.0–10.5)
nRBC: 0 % (ref 0.0–0.2)

## 2019-10-27 MED ORDER — CHLORHEXIDINE GLUCONATE CLOTH 2 % EX PADS: 6.0000 | MEDICATED_PAD | Freq: Every day | CUTANEOUS | Status: DC

## 2019-10-27 MED ORDER — POTASSIUM CHLORIDE CRYS ER 20 MEQ PO TBCR
40.0000 meq | EXTENDED_RELEASE_TABLET | Freq: Two times a day (BID) | ORAL | Status: AC
  Administered 2019-10-27 (×2): 40 meq via ORAL
  Filled 2019-10-27 (×2): qty 2

## 2019-10-27 MED ORDER — HYDROCODONE-ACETAMINOPHEN 5-325 MG PO TABS: 1.0000 | ORAL_TABLET | ORAL | Status: DC | PRN

## 2019-10-27 MED ORDER — HYDROCODONE-ACETAMINOPHEN 5-325 MG PO TABS
2.0000 | ORAL_TABLET | ORAL | Status: DC | PRN
  Administered 2019-10-27 – 2019-10-28 (×2): 2 via ORAL
  Filled 2019-10-27 (×2): qty 2

## 2019-10-27 NOTE — Progress Notes (Signed)
[facility] PROGRESS NOTE    Progress Note  Betty Jackson  JQB:[id number] DOB: 1934/09/09 DOA: 10/25/2019 PCP: Eulas Post, MD     Brief Narrative:   Betty Jackson is an 85 y.o. 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 difficile for which she was hospitalized twice.  Was been hospitalized twice in the last month for 2 different fractures of her right femur underwent ORIF in 10/17/2019 transfuse 1 unit of packed red blood cells and discharged to skilled nursing facility on 10/23/2019.  Comes back to the ED for fevers and being lethargic at skilled nursing facility.  Per report from the nursing home she has had poor oral intake over the last 48 hours in the ED she was found to have a temperature of 101 and mildly tachypneic chest x-ray shows pneumonia.  Assessment/Plan:   Sepsis due to HCAP (healthcare-associated pneumonia): Speech evaluated the patient did deemed her low risk for aspiration. SARS-CoV-2 PCR was negative on admission. Continue IV empiric vancomycin and cefepime urine cultures have remained negative till date. Palliative care did meet with the family and will address goals of care as an outpatient.  Acute metabolic encephalopathy: Multifactorial in the setting of infectious etiology and dehydration she is nonfocal physical exam. Now resolved.  Acute kidney injury on chronic kidney disease stage III: Baseline creatinine is less than 1, on admission 1.3 fluid resuscitated in the ED now her creatinine is returned to baseline.  New right upper extremity cellulitis: She is currently on IV Vanco and cefepime which should cover repeat a CBC today. Get an imaging of that right forearm make sure there is no fracture or dislocation, it  appears swollen erythematous and tender to touch. Blood cultures ordered on admission have remained negative till date.  Normocytic anemia: Hemoglobin seems to be stable compared to previous admissions.  Paroxysmal atrial fibrillation: Controlled on Cardizem and metoprolol not on anticoagulation due to multiple falls. In the house she is in DVT prophylaxis heart rate is rate controlled.  Chronic diastolic heart failure: Still appears euvolemic on physical exam, her urine is seems quite concentrated, continue to hold her Lasix strict I's and O's and daily weights.  Giant cell arteritis: On chronic steroids, she does not appear to be in adrenal insufficiency her blood pressure and heart rate are stable blood glucose is unremarkable. She was given a shot of hydrocortisone intravenously in the ED we will transition her to her current home dose of prednisone.  CAD: Noted.  Chronic pain syndrome: This could be contributing to her encephalopathy in the setting of acute kidney injury. We will reinitiate once her mentation is improved.  Stage II sacral decubitus ulcer present on admission. RN Pressure Injury Documentation: Pressure Injury 10/17/19 Heel Right Stage 2 -  Partial thickness loss of dermis presenting as a shallow open injury with a red, pink wound bed without slough. (Active)  10/17/19 2100  Location: Heel  Location Orientation: Right  Staging: Stage 2 -  Partial thickness loss of dermis presenting as a shallow open injury with a red, pink wound bed without slough.  Wound Description (Comments):   Present on Admission: Yes    Estimated body mass index is 26.26 kg/m as calculated from the following:   Height as of this encounter: 5\' 4"  (1.626  m).   Weight as of this encounter: 69.4 kg.    DVT prophylaxis: lovenxo Family Communication:none Status is: Inpatient  Remains inpatient appropriate because:Hemodynamically unstable   Dispo: The patient is from: SNF               Anticipated d/c is to: SNF              Anticipated d/c date is: 3 days              Patient currently is not medically stable to d/c.        Code Status:     Code Status Orders  (From admission, onward)         Start     Ordered   10/25/19 2014  Do not attempt resuscitation (DNR)  Continuous       Question Answer Comment  In the event of cardiac or respiratory ARREST Do not call a "code blue"   In the event of cardiac or respiratory ARREST Do not perform Intubation, CPR, defibrillation or ACLS   In the event of cardiac or respiratory ARREST Use medication by any route, position, wound care, and other measures to relive pain and suffering. May use oxygen, suction and manual treatment of airway obstruction as needed for comfort.      10/25/19 2013        Code Status History    Date Active Date Inactive Code Status Order ID Comments User Context   10/17/2019 0310 10/23/2019 2327 DNR [id number]  Etta Quill, DO ED   09/25/2019 2029 09/30/2019 0331 DNR [id number]  Toy Baker, MD Inpatient   06/19/2019 2346 06/28/2019 0212 DNR [id number]  Shela Leff, MD ED   06/19/2019 2346 06/19/2019 2346 Full Code [id number]  Shela Leff, MD ED   05/29/2019 0111 06/01/2019 2315 DNR [id number]  Shela Leff, MD ED   04/15/2019 1608 04/21/2019 1833 DNR [id number]  Lequita Halt, MD ED   03/09/2019 0828 03/13/2019 1649 DNR [id number]  Cristal Generous, NP Inpatient   03/08/2019 1225 03/09/2019 0828 Full Code [id number]  Ivin Poot, MD Inpatient   03/06/2019 1802 03/08/2019 1224 DNR [id number]  Candee Furbish, MD Inpatient   03/06/2019 1757 03/06/2019 1802 Full Code [id number]  Candee Furbish, MD Inpatient   01/13/2019 1734 01/20/2019 2138 DNR [id number]  Guilford Shi, MD ED   01/13/2019 1634 01/13/2019 1734 Full Code [id number]  Guilford Shi, MD ED   05/20/2017 1542 05/26/2017 2033 DNR [id number]  Samella Parr, NP ED   11/04/2015 1910 11/05/2015 1658 Full Code [id number]   Kristeen Miss, MD Inpatient   04/25/2013 2250 05/01/2013 1923 Full Code [id number]  Kristeen Miss, MD Inpatient   10/28/2012 1850 10/30/2012 1701 Full Code [id number]  Eugenie Filler, MD Inpatient   Advance Care Planning Activity    Advance Directive Documentation     Most Recent Value  Type of Advance Directive Healthcare Power of Attorney, Living will  Pre-existing out of facility DNR order (yellow form or pink MOST form) --  "MOST" Form in Place? --        IV Access:    Peripheral IV   Procedures and diagnostic studies:   DG Chest Port 1 View  Result Date: 10/25/2019 CLINICAL DATA:  Shortness of breath.  Fever.  Coronavirus exposure. EXAM: PORTABLE CHEST 1 VIEW COMPARISON:  10/17/2019 FINDINGS: Heart size remains normal. Aortic atherosclerosis as seen previously. Slight worsening atelectasis and or mild infiltrate at both lung bases. Upper lungs  remain clear radiographically. No visible effusion. No acute bone finding. IMPRESSION: Slight worsening atelectasis or mild infiltrate at both lung bases. Electronically Signed   By: Nelson Chimes M.D.   On: 10/25/2019 17:50   DG Swallowing Func-Speech Pathology  Result Date: 10/26/2019 Objective Swallowing Evaluation: Type of Study: MBS-Modified Barium Swallow Study  Patient Details Name: Betty Jackson MRN: [id number] Date of Birth: May 31, 1934 Today's Date: 10/26/2019 Time: SLP Start Time (ACUTE ONLY): 1340 -SLP Stop Time (ACUTE ONLY): 1355 SLP Time Calculation (min) (ACUTE ONLY): 15 min Past Medical History: Past Medical History: Diagnosis Date . Acute respiratory failure with hypoxia ([place]) 05/20/2017 . Anemia, unspecified 10/28/2012 . Anxiety state, unspecified 10/28/2012 . CAD (coronary artery disease)   a. Stent RCA in [facility] [facility];  b. Cath approx 2009 - nonobs per pt report. . Cataract   immature on the left eye . Chronic insomnia 02/07/2013 . Chronic lower back pain   scoliosis . CKD (chronic kidney disease) stage 3, GFR 30-59 ml/min  . CVA  (cerebral infarction) 10/29/2012 . DDD (degenerative disc disease)  . Depression  . Diverticulosis  . GERD (gastroesophageal reflux disease) 09/01/2010 . Hemorrhoids  . Herniated nucleus pulposus, L5-S1, right 11/04/2015 . Hyperlipidemia   takes Lipitor daily . Hypertension   takes Amlodipine,Losartan,Metoprolol,and HCTZ daily . Incontinence of urine  . Insomnia   takes Restoril nightly . Lumbar stenosis 04/25/2013 . Major depressive disorder, recurrent episode, moderate ([place]) 07/16/2013 . Osteoporosis  . PAF (paroxysmal atrial fibrillation) ([place]) 2011  a. lone epidode in 2011 according to notes. . Rheumatoid arthritis ([place])  . Scoliosis  . Sepsis ([place]) 05/2019 . Temporal arteritis ([place]) 2011  a. followed @ [facility]; potential flareup 10/28/2012/notes 10/28/2012 . Vocal cord dysfunction   "they don't operate properly" (10/28/2012) Past Surgical History: Past Surgical History: Procedure Laterality Date . ABDOMINAL HYSTERECTOMY  ~ 1984  vaginally . BACK SURGERY  7-71yrs ago  X Stop . BLADDER SUSPENSION  2001 . BREAST BIOPSY Right  . CATARACT EXTRACTION W/ INTRAOCULAR LENS IMPLANT Right ~ 08/2012 . CHEST TUBE INSERTION Left 03/08/2019  Procedure: Chest Tube Insertion;  Surgeon: Ivin Poot, MD;  Location: [place];  Service: Thoracic;  Laterality: Left; . COLONOSCOPY  01/26/2012  Procedure: COLONOSCOPY;  Surgeon: Ladene Artist, MD,FACG;  Location: [facility] ENDOSCOPY;  Service: Endoscopy;  Laterality: N/A;  note the EGD is possible . CORONARY ANGIOPLASTY WITH STENT PLACEMENT  2006  X 1 stent . EPIDURAL BLOCK INJECTION   . ESOPHAGOGASTRODUODENOSCOPY  01/26/2012  Procedure: ESOPHAGOGASTRODUODENOSCOPY (EGD);  Surgeon: Ladene Artist, MD,FACG;  Location: [facility] ENDOSCOPY;  Service: Endoscopy;  Laterality: N/A; . FINE NEEDLE ASPIRATION Right 09/26/2019  Procedure: FINE NEEDLE ASPIRATION;  Surgeon: Altamese Wartburg, MD;  Location: [place];  Service: Orthopedics;  Laterality: Right; . HEMIARTHROPLASTY HIP Right 2012 . IR EPIDUROGRAPHY  04/20/2019 .  LAPAROSCOPIC CHOLECYSTECTOMY  2001 . LUMBAR FUSION  03/2013 . LUMBAR LAMINECTOMY/DECOMPRESSION MICRODISCECTOMY Right 11/04/2015  Procedure: Right Lumbar Five-Sacral One Microdiskectomy;  Surgeon: Kristeen Miss, MD;  Location: [place] NEURO ORS;  Service: Neurosurgery;  Laterality: Right;  Right L5-S1 Microdiskectomy . moles removed that required stiches    one on leg and one on face . ORIF FEMUR FRACTURE Right 09/26/2019  Procedure: OPEN REDUCTION INTERNAL FIXATION (ORIF) DISTAL FEMUR FRACTURE;  Surgeon: Altamese Paw Paw, MD;  Location: [place];  Service: Orthopedics;  Laterality: Right; . ORIF FEMUR FRACTURE Right 10/17/2019  Procedure: OPEN REDUCTION INTERNAL FIXATION (ORIF) DISTAL FEMUR FRACTURE;  Surgeon: Altamese Marblemount, MD;  Location: [place];  Service: Orthopedics;  Laterality: Right; . SUBXYPHOID PERICARDIAL WINDOW N/A 03/08/2019  Procedure: SUBXYPHOID PERICARDIAL WINDOW;  Surgeon: Ivin Poot, MD;  Location: [place];  Service: Thoracic;  Laterality: N/A; . TEE WITHOUT CARDIOVERSION N/A 03/08/2019  Procedure: TRANSESOPHAGEAL ECHOCARDIOGRAM (TEE);  Surgeon: Prescott Gum, Collier Salina, MD;  Location: [place];  Service: Thoracic;  Laterality: N/A; . TEMPORAL ARTERY BIOPSY / LIGATION Bilateral 2011 . TONSILLECTOMY AND ADENOIDECTOMY    at age 53 . TUBAL LIGATION  ~ 1982 . X-STOP IMPLANTATION  ~ 2010  "lower back" (10/28/2012) HPI: Pt is an 85 y.o. 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  She underwent ORIF on 7/20. She was discharged to skilled nursing facility on 7/26 and returned to the ED due to presence of fever. CXR 7/28: Slight worsening atelectasis or mild infiltrate at both lung bases. BSE 09/27/19: normal oropharyngeal swallow  No data recorded Assessment / Plan / Recommendation  CHL IP CLINICAL IMPRESSIONS 10/26/2019 Clinical Impression Pt was seen in radiology suite for modified barium swallow study. Trials of puree solids, mixed consistency boluses (mechanical soft solids with thin liquids), regular texture solids, a 13 mm barium tablet, and thin liquids via cup and straw were administered. Pt's oropharyngeal swallow mechanism was within normal limits. Transient penetration (PAS 2) was noted with consecutive swallows of thin liquids, but this is considered to be within normal limits and no instances of aspiration were demonstrated. Considering pt's presentation at bedside, with coughing following consecutive swallows, SLP anticipates that depth of laryngeal invasion likely increases with sub-optimal positioning. It is recommended that a regular texture diet with thin liquids be initiated with observance of swallowing precautions. SLP will follow briefly to ensure tolerance of the diet and observance of precautions. However, further skilled SLP services will likely not be needed beyond that.  SLP Visit Diagnosis Dysphagia, unspecified (R13.10) Attention and concentration deficit following -- Frontal lobe and executive function deficit following -- Impact on safety and function No limitations   CHL IP TREATMENT RECOMMENDATION 10/26/2019 Treatment Recommendations Therapy as outlined in treatment plan below   Prognosis 10/26/2019 Prognosis for Safe Diet Advancement Good Barriers to Reach Goals -- Barriers/Prognosis Comment -- CHL IP DIET RECOMMENDATION 10/26/2019 SLP Diet Recommendations Regular solids;Thin liquid Liquid Administration via Cup;Straw Medication Administration Whole meds with liquid Compensations Slow rate Postural Changes Seated upright at 90 degrees   CHL IP OTHER RECOMMENDATIONS 10/26/2019 Recommended Consults -- Oral Care Recommendations Oral care BID Other Recommendations --   CHL IP FOLLOW UP RECOMMENDATIONS 10/26/2019 Follow up Recommendations Other (comment)   CHL IP  FREQUENCY AND DURATION 10/26/2019 Speech Therapy Frequency (ACUTE ONLY) min 2x/week Treatment Duration 1 week      CHL IP ORAL PHASE 10/26/2019 Oral Phase WFL Oral - Pudding Teaspoon -- Oral - Pudding Cup -- Oral - Honey Teaspoon -- Oral - Honey Cup -- Oral - Nectar Teaspoon -- Oral - Nectar Cup -- Oral - Nectar Straw -- Oral - Thin Teaspoon -- Oral - Thin Cup -- Oral - Thin Straw -- Oral - Puree -- Oral - Mech Soft -- Oral - Regular -- Oral - Multi-Consistency -- Oral - Pill -- Oral Phase - Comment --  CHL IP PHARYNGEAL PHASE 10/26/2019 Pharyngeal Phase WFL Pharyngeal- Pudding Teaspoon -- Pharyngeal -- Pharyngeal- Pudding Cup --  Pharyngeal -- Pharyngeal- Honey Teaspoon -- Pharyngeal -- Pharyngeal- Honey Cup -- Pharyngeal -- Pharyngeal- Nectar Teaspoon -- Pharyngeal -- Pharyngeal- Nectar Cup -- Pharyngeal -- Pharyngeal- Nectar Straw -- Pharyngeal -- Pharyngeal- Thin Teaspoon -- Pharyngeal -- Pharyngeal- Thin Cup -- Pharyngeal -- Pharyngeal- Thin Straw -- Pharyngeal -- Pharyngeal- Puree -- Pharyngeal -- Pharyngeal- Mechanical Soft -- Pharyngeal -- Pharyngeal- Regular -- Pharyngeal -- Pharyngeal- Multi-consistency -- Pharyngeal -- Pharyngeal- Pill -- Pharyngeal -- Pharyngeal Comment --  CHL IP CERVICAL ESOPHAGEAL PHASE 10/26/2019 Cervical Esophageal Phase WFL Pudding Teaspoon -- Pudding Cup -- Honey Teaspoon -- Honey Cup -- Nectar Teaspoon -- Nectar Cup -- Nectar Straw -- Thin Teaspoon -- Thin Cup -- Thin Straw -- Puree -- Mechanical Soft -- Regular -- Multi-consistency -- Pill -- Cervical Esophageal Comment -- Betty Jackson, [place], [place] Office number [phone] Pager [place] 10/26/2019, 5:07 PM                Medical Consultants:    None.  Anti-Infectives:   IV vancomycin and cefepime  Subjective:    Betty Jackson she is having new right arm pain  Objective:    Vitals:   10/26/19 1647 10/26/19 1950 10/27/19 0421 10/27/19 0918  BP: (!)  134/65 (!) 154/62 (!) 155/64 (!) 153/72  Pulse: 89 91 71 90  Resp: 19 23 20 20   Temp: 98.1 F (36.7 C) 98.6 F (37 C) 97.7 F (36.5 C) 98.2 F (36.8 C)  TempSrc: Oral Oral Oral Oral  SpO2: 98% 98% 96% 97%  Weight:   69.4 kg   Height:       SpO2: 97 %   Intake/Output Summary (Last 24 hours) at 10/27/2019 1036 Last data filed at 10/27/2019 0900 Gross per 24 hour  Intake 720 ml  Output 901 ml  Net -181 ml   Filed Weights   10/25/19 1653 10/26/19 0140 10/27/19 0421  Weight: 68 kg 70.3 kg 69.4 kg    Exam: General exam: In no acute distress. Respiratory system: Good air movement and clear to auscultation. Cardiovascular system: S1 & S2 heard, RRR. No JVD. Gastrointestinal system: Abdomen is nondistended, soft and nontender.  Extremities: No pedal edema. Skin: Erythema trending in the cubital side of the right forearm warm, and tender to touch. Psychiatry: Judgement and insight appear normal. Mood & affect appropriate.   Data Reviewed:    Labs: Basic Metabolic Panel: Recent Labs  Lab 10/21/19 0311 10/21/19 0311 10/22/19 0302 10/22/19 0302 10/25/19 1745 10/26/19 0432  NA 138  --  138  --  135 138  K 3.4*   < > 3.2*   < > 3.6 3.9  CL 99  --  98  --  97* 103  CO2 31  --  30  --  26 23  GLUCOSE 95  --  99  --  109* 121*  BUN 10  --  9  --  13 10  CREATININE 0.75  --  0.72  --  1.23* 0.85  CALCIUM 8.5*  --  8.7*  --  8.6* 8.1*  MG 1.7  --  1.6*  --   --   --    < > = values in this interval not displayed.   GFR Estimated Creatinine Clearance: 48 mL/min (by C-G formula based on SCr of 0.85 mg/dL). Liver Function Tests: Recent Labs  Lab 10/25/19 1745 10/26/19 0432  AST 22 17  ALT 19 16  ALKPHOS 146* 121  BILITOT 2.4* 1.6*  PROT 6.2* 5.1*  ALBUMIN 2.6* 2.1*   No results for input(s): LIPASE, AMYLASE in the last 168 hours. No results for input(s): AMMONIA in the last 168 hours. Coagulation profile Recent Labs  Lab 10/25/19 1745  INR 1.3*   COVID-19  Labs  No results for input(s): DDIMER, FERRITIN, LDH, CRP in the last 72 hours.  Lab Results  Component Value Date   SARSCOV2NAA NEGATIVE 10/25/2019   [place] NEGATIVE 10/23/2019   [place] NEGATIVE 10/17/2019   [place] NEGATIVE 09/28/2019    CBC: Recent Labs  Lab 10/21/19 0311 10/22/19 0302 10/25/19 1745 10/26/19 0432  WBC 7.9 9.4 15.8* 15.2*  NEUTROABS 5.1 6.2 12.1*  --   HGB 10.4* 11.1* 11.3* 9.9*  HCT 32.2* 34.4* 35.6* 30.1*  MCV 92.0 91.0 93.4 91.8  PLT 194 222 338 286   Cardiac Enzymes: No results for input(s): CKTOTAL, CKMB, CKMBINDEX, TROPONINI in the last 168 hours. BNP (last 3 results) No results for input(s): PROBNP in the last 8760 hours. CBG: No results for input(s): GLUCAP in the last 168 hours. D-Dimer: No results for input(s): DDIMER in the last 72 hours. Hgb A1c: No results for input(s): HGBA1C in the last 72 hours. Lipid Profile: No results for input(s): CHOL, HDL, LDLCALC, TRIG, CHOLHDL, LDLDIRECT in the last 72 hours. Thyroid function studies: No results for input(s): TSH, T4TOTAL, T3FREE, THYROIDAB in the last 72 hours.  Invalid input(s): FREET3 Anemia work up: No results for input(s): VITAMINB12, FOLATE, FERRITIN, TIBC, IRON, RETICCTPCT in the last 72 hours. Sepsis Labs: Recent Labs  Lab 10/21/19 0311 10/22/19 0302 10/25/19 1744 10/25/19 1745 10/25/19 1944 10/26/19 0055 10/26/19 0432  WBC 7.9 9.4  --  15.8*  --   --  15.2*  LATICACIDVEN  --   --  1.6  --  2.0* 1.2  --    Microbiology Recent Results (from the past 240 hour(s))  Surgical pcr screen     Status: None   Collection Time: 10/17/19  1:20 PM   Specimen: Nasal Mucosa; Nasal Swab  Result Value Ref Range Status   MRSA, PCR NEGATIVE NEGATIVE Final   Staphylococcus aureus NEGATIVE NEGATIVE Final    Comment: (NOTE) The Xpert SA Assay (FDA approved for NASAL specimens in patients 11 years of age and older), is one component of a comprehensive surveillance program.  It is not intended to diagnose infection nor to guide or monitor treatment. Performed at [facility], [place] [address], [place], [place] [address]   SARS CORONAVIRUS 2 (TAT 6-24 HRS) Nasopharyngeal Nasopharyngeal Swab     Status: None   Collection Time: 10/23/19  3:42 AM   Specimen: Nasopharyngeal Swab  Result Value Ref Range Status   SARS Coronavirus 2 NEGATIVE NEGATIVE Final    Comment: (NOTE) SARS-CoV-2 target nucleic acids are NOT DETECTED.  The SARS-CoV-2 RNA is generally detectable in upper and lower respiratory specimens during the acute phase of infection. Negative results do not preclude SARS-CoV-2 infection, do not rule out co-infections with other pathogens, and should not be used as the sole basis for treatment or other patient management decisions. Negative results must be combined with clinical observations, patient history, and epidemiological information. The expected result is Negative.  Fact Sheet for Patients: SugarRoll.be  Fact Sheet for Healthcare Providers: https://www.woods-mathews.com/  This test is not yet approved or cleared by the [place] FDA and  has been authorized for detection and/or diagnosis of SARS-CoV-2 by FDA under an Emergency Use Authorization (EUA). This EUA will remain  in effect (meaning this test can be used) for the duration of the COVID-19 declaration under Se ction 564(b)(1) of the Act, 21 U.S.C. section 360bbb-3(b)(1), unless the authorization is terminated or revoked sooner.  Performed at [facility], [place] [address] [place], [place] [address]   Urine culture     Status: None   Collection Time: 10/25/19  5:16 PM   Specimen: In/Out Cath Urine  Result Value Ref Range Status   Specimen Description IN/OUT CATH URINE  Final   Special Requests NONE  Final   Culture   Final    NO GROWTH Performed at [facility], [place] [address], [place], [place] [address]    Report  Status 10/26/2019 FINAL  Final  Blood Culture (routine x 2)     Status: None (Preliminary result)   Collection Time: 10/25/19  5:45 PM   Specimen: BLOOD  Result Value Ref Range Status   Specimen Description BLOOD RIGHT ANTECUBITAL  Final   Special Requests   Final    BOTTLES DRAWN AEROBIC AND ANAEROBIC Blood Culture adequate volume   Culture   Final    NO GROWTH < 24 HOURS Performed at [facility], [place] [address] [place], [place] [address]    Report Status PENDING  Incomplete  Blood Culture (routine x 2)     Status: None (Preliminary result)   Collection Time: 10/25/19  5:45 PM   Specimen: BLOOD RIGHT WRIST  Result Value Ref Range Status   Specimen Description BLOOD RIGHT WRIST  Final   Special Requests   Final    BOTTLES DRAWN AEROBIC ONLY Blood Culture results may not be optimal due to an inadequate volume of blood received in culture bottles   Culture   Final    NO GROWTH < 24 HOURS Performed at [facility], [place] [address] [place], [place] [address]    Report Status PENDING  Incomplete  SARS Coronavirus 2 by RT PCR (hospital order, performed in [facility] hospital lab) Nasopharyngeal Nasopharyngeal Swab     Status: None   Collection Time: 10/25/19  8:31 PM   Specimen: Nasopharyngeal Swab  Result Value Ref Range Status   SARS Coronavirus 2 NEGATIVE NEGATIVE Final    Comment: (NOTE) SARS-CoV-2 target nucleic acids are NOT DETECTED.  The SARS-CoV-2 RNA is generally detectable in upper and lower respiratory specimens during the acute phase of infection. The lowest concentration of SARS-CoV-2 viral copies this assay can detect is 250 copies / mL. A negative result does not preclude SARS-CoV-2 infection and should not be used as the sole basis for treatment or other patient management decisions.  A negative result may occur with improper specimen collection / handling, submission of specimen other than nasopharyngeal swab, presence of viral mutation(s) within  the areas targeted by this assay, and inadequate number of viral copies (<250 copies / mL). A negative result must be combined with clinical observations, patient history, and epidemiological information.  Fact Sheet for Patients:   StrictlyIdeas.no  Fact Sheet for Healthcare Providers: BankingDealers.co.za  This test is not yet approved or  cleared by the [place] FDA and has been authorized for detection and/or diagnosis of SARS-CoV-2 by FDA under an Emergency Use Authorization (EUA).  This EUA will remain in effect (meaning this test can be used) for the duration of the COVID-19 declaration under Section 564(b)(1) of the Act, 21 U.S.C. section 360bbb-3(b)(1), unless the authorization is terminated or revoked sooner.  Performed at [facility]  [facility], [place] [address] [address] [place], [place] [address]   MRSA PCR Screening     Status: Abnormal   Collection Time: 10/26/19  9:05 AM   Specimen: Nasopharyngeal  Result Value Ref Range Status   MRSA by PCR POSITIVE (A) NEGATIVE Final    Comment:        The GeneXpert MRSA Assay (FDA approved for NASAL specimens only), is one component of a comprehensive MRSA colonization surveillance program. It is not intended to diagnose MRSA infection nor to guide or monitor treatment for MRSA infections. RESULT CALLED TO, READ BACK BY AND VERIFIED WITH: Melony Overly RN 11:30 10/26/19 ([email or web address]) Performed at [facility], [place] [address], [place], [place] [address]      Medications:   . Chlorhexidine Gluconate Cloth  6 each Topical Daily  . diltiazem  240 mg Oral Daily  . enoxaparin (LOVENOX) injection  40 mg Subcutaneous Q24H  . escitalopram  10 mg Oral Daily  . fentaNYL  1 patch Transdermal Q72H  . metoprolol tartrate  50 mg Oral BID  . mupirocin ointment  1 application Nasal BID  . predniSONE  10 mg Oral Q breakfast  . saccharomyces boulardii  250 mg Oral BID   Continuous Infusions: .  ceFEPime (MAXIPIME) IV 2 g (10/27/19 1027)  . vancomycin 500 mg (10/27/19 0145)      LOS: 2 days   Charlynne Cousins  [facility]  10/27/2019, 10:36 AM

## 2019-10-28 ENCOUNTER — Inpatient Hospital Stay (HOSPITAL_COMMUNITY): Payer: Medicare Other

## 2019-10-28 MED ORDER — AZITHROMYCIN 500 MG PO TABS: 500.0000 mg | ORAL_TABLET | Freq: Every day | ORAL | Status: DC

## 2019-10-28 MED ORDER — MELATONIN 3 MG PO TABS
3.0000 mg | ORAL_TABLET | Freq: Every day | ORAL | Status: DC
  Administered 2019-10-28 – 2019-10-29 (×2): 3 mg via ORAL
  Filled 2019-10-28 (×2): qty 1

## 2019-10-28 MED ORDER — QUETIAPINE FUMARATE 25 MG PO TABS: 25.0000 mg | ORAL_TABLET | Freq: Every evening | ORAL | Status: DC | PRN

## 2019-10-28 MED ORDER — POTASSIUM CHLORIDE CRYS ER 20 MEQ PO TBCR
40.0000 meq | EXTENDED_RELEASE_TABLET | Freq: Two times a day (BID) | ORAL | Status: AC
  Administered 2019-10-28 (×2): 40 meq via ORAL
  Filled 2019-10-28 (×2): qty 2

## 2019-10-28 MED ORDER — POLYETHYLENE GLYCOL 3350 17 G PO PACK
17.0000 g | PACK | Freq: Two times a day (BID) | ORAL | Status: DC
  Administered 2019-10-28 – 2019-10-29 (×3): 17 g via ORAL
  Filled 2019-10-28 (×4): qty 1

## 2019-10-28 MED ORDER — FUROSEMIDE 40 MG PO TABS
40.0000 mg | ORAL_TABLET | Freq: Every day | ORAL | Status: DC
  Administered 2019-10-28 – 2019-10-30 (×3): 40 mg via ORAL
  Filled 2019-10-28 (×3): qty 1

## 2019-10-28 MED ORDER — SENNOSIDES-DOCUSATE SODIUM 8.6-50 MG PO TABS
1.0000 | ORAL_TABLET | Freq: Every day | ORAL | Status: DC
  Administered 2019-10-28 – 2019-10-29 (×2): 1 via ORAL
  Filled 2019-10-28 (×2): qty 1

## 2019-10-28 MED ORDER — POTASSIUM CHLORIDE CRYS ER 10 MEQ PO TBCR
10.0000 meq | EXTENDED_RELEASE_TABLET | Freq: Every day | ORAL | Status: DC
  Administered 2019-10-29 – 2019-10-30 (×2): 10 meq via ORAL
  Filled 2019-10-28 (×2): qty 1

## 2019-10-28 MED ORDER — AMOXICILLIN-POT CLAVULANATE 875-125 MG PO TABS
1.0000 | ORAL_TABLET | Freq: Two times a day (BID) | ORAL | Status: DC
  Administered 2019-10-28 – 2019-10-30 (×5): 1 via ORAL
  Filled 2019-10-28 (×5): qty 1

## 2019-10-28 MED ORDER — DOXYCYCLINE HYCLATE 100 MG PO TABS
100.0000 mg | ORAL_TABLET | Freq: Two times a day (BID) | ORAL | Status: DC
  Administered 2019-10-28 – 2019-10-30 (×5): 100 mg via ORAL
  Filled 2019-10-28 (×5): qty 1

## 2019-10-28 MED ORDER — HYDROCODONE-ACETAMINOPHEN 10-325 MG PO TABS
1.0000 | ORAL_TABLET | ORAL | Status: DC | PRN
  Administered 2019-10-28: 2 via ORAL
  Administered 2019-10-28 – 2019-10-29 (×2): 1 via ORAL
  Administered 2019-10-30: 2 via ORAL
  Filled 2019-10-28 (×2): qty 2
  Filled 2019-10-28 (×2): qty 1

## 2019-10-28 NOTE — Progress Notes (Addendum)
[facility] PROGRESS NOTE    Progress Note  Betty Jackson  EUM:[id number] DOB: 11-06-1934 DOA: 10/25/2019 PCP: Eulas Post, MD     Brief Narrative:   Betty Jackson is an 85 y.o. 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 difficile for which she was hospitalized twice.  Was been hospitalized twice in the last month for 2 different fractures of her right femur underwent ORIF in 10/17/2019 transfuse 1 unit of packed red blood cells and discharged to skilled nursing facility on 10/23/2019.  Comes back to the ED for fevers and being lethargic at skilled nursing facility.  Per report from the nursing home she has had poor oral intake over the last 48 hours in the ED she was found to have a temperature of 101 and mildly tachypneic chest x-ray shows pneumonia.  Assessment/Plan:   Sepsis due to HCAP (healthcare-associated pneumonia): She came from [facility] home facility. Speech evaluated the patient did deemed her low risk for aspiration. SARS-CoV-2 PCR was negative on admission. Continue IV empiric vancomycin and cefepime urine cultures have remained negative till date. Transition to doxycycline (which should cover her right upper extremity cellulitis) and Augmentin. Palliative care did meet with the family and will address goals of care as an outpatient.  Acute metabolic encephalopathy/acute confusional state: Multifactorial in the setting of infectious etiology and dehydration she is nonfocal physical exam. She is developing episodes of acute confusional state.  Acute kidney injury on chronic kidney disease stage IIIa: Baseline creatinine is less than 1, on admission 1.3 fluid resuscitated in the ED now her creatinine is returned to  baseline.  New right upper extremity pain/cellulitis: She was on IV Vanco and cefepime, she has remained afebrile her leukocytosis is improving. We will transition to oral doxycycline.  Imaging showed no osteomyelitis, blood culture data has remained negative till date. Now her right hand specifically her index finger at the metacarpal joint is swollen and tender we will get an x-ray.  Normocytic anemia: Hemoglobin seems to be stable compared to previous admissions.  Paroxysmal atrial fibrillation: Controlled on Cardizem and metoprolol not on anticoagulation due to multiple falls. In the house she is in DVT prophylaxis heart rate is rate controlled.  Chronic diastolic heart failure: Still appears euvolemic on physical exam, her urine is seems quite concentrated, continue to hold her Lasix strict I's and O's and daily weights.  Giant cell arteritis: On chronic steroids, she does not appear to be in adrenal insufficiency her blood pressure and heart rate are stable blood glucose is unremarkable. She was given a shot of hydrocortisone intravenously in the ED we will transition her to her current home dose of prednisone.  CAD: Noted.  Chronic pain syndrome: This could be contributing to her encephalopathy in the setting of acute kidney injury. We will reinitiate once her mentation is improved.  Stage II sacral decubitus ulcer present on admission. RN Pressure Injury Documentation: Pressure Injury 10/17/19 Heel Right Stage 2 -  Partial thickness loss of dermis presenting as a shallow open injury with a red, pink wound bed without slough. (Active)  10/17/19 2100  Location: Heel  Location Orientation: Right  Staging: Stage 2 -  Partial thickness loss of dermis presenting as a shallow open injury with a red, pink wound bed without slough.  Wound Description (Comments):   Present on Admission: Yes    Estimated body mass index is 27.46 kg/m as calculated from the following:   Height as  of this encounter: 5\' 4"  (1.626 m).   Weight as of this encounter: 72.6 kg.    DVT prophylaxis: lovenxo Family Communication:none Status is: Inpatient  Remains inpatient appropriate because:Hemodynamically unstable   Dispo: The patient is from: SNF              Anticipated d/c is to: SNF              Anticipated d/c date is: 3 days              Patient currently is not medically stable to d/c.        Code Status:     Code Status Orders  (From admission, onward)           Start     Ordered   10/25/19 2014  Do not attempt resuscitation (DNR)  Continuous       Question Answer Comment  In the event of cardiac or respiratory ARREST Do not call a "code blue"   In the event of cardiac or respiratory ARREST Do not perform Intubation, CPR, defibrillation or ACLS   In the event of cardiac or respiratory ARREST Use medication by any route, position, wound care, and other measures to relive pain and suffering. May use oxygen, suction and manual treatment of airway obstruction as needed for comfort.      10/25/19 2013           Code Status History     Date Active Date Inactive Code Status Order ID Comments User Context   10/17/2019 0310 10/23/2019 2327 DNR [id number]  Etta Quill, DO ED   09/25/2019 2029 09/30/2019 0331 DNR [id number]  Toy Baker, MD Inpatient   06/19/2019 2346 06/28/2019 0212 DNR [id number]  Shela Leff, MD ED   06/19/2019 2346 06/19/2019 2346 Full Code [id number]  Shela Leff, MD ED   05/29/2019 0111 06/01/2019 2315 DNR [id number]  Shela Leff, MD ED   04/15/2019 1608 04/21/2019 1833 DNR [id number]  Lequita Halt, MD ED   03/09/2019 0828 03/13/2019 1649 DNR [id number]  Cristal Generous, NP Inpatient   03/08/2019 1225 03/09/2019 0828 Full Code [id number]  Ivin Poot, MD Inpatient   03/06/2019 1802 03/08/2019 1224 DNR [id number]  Candee Furbish, MD Inpatient   03/06/2019 1757 03/06/2019 1802 Full Code [id number]  Candee Furbish, MD Inpatient    01/13/2019 1734 01/20/2019 2138 DNR [id number]  Guilford Shi, MD ED   01/13/2019 1634 01/13/2019 1734 Full Code [id number]  Guilford Shi, MD ED   05/20/2017 1542 05/26/2017 2033 DNR [id number]  Samella Parr, NP ED   11/04/2015 1910 11/05/2015 1658 Full Code [id number]  Kristeen Miss, MD Inpatient   04/25/2013 2250 05/01/2013 1923 Full Code [id number]  Kristeen Miss, MD Inpatient   10/28/2012 1850 10/30/2012 1701 Full Code [id number]  Eugenie Filler, MD Inpatient   Advance Care Planning Activity      Advance Directive Documentation      Most Recent Value  Type of Advance Directive Healthcare Power of Attorney, Living will  Pre-existing out of facility DNR order (yellow form or pink MOST form) --  "MOST" Form in Place? --         IV Access:   Peripheral IV   Procedures and diagnostic studies:   DG Forearm Right  Result Date: 10/27/2019 CLINICAL  DATA:  Right forearm pain.  No known injury. EXAM: RIGHT FOREARM - 2 VIEW COMPARISON:  No recent. FINDINGS: IV catheter noted. Small benign-appearing soft tissue calcification noted over the posterior aspect of the upper extremity. Degenerative changes right wrist and elbow. No acute bony or joint abnormality identified. Peripheral vascular calcification. IMPRESSION: 1. Degenerative changes right wrist and elbow. No acute bony abnormality. 2.  Peripheral vascular disease. Electronically Signed   By: Marcello Moores  Register   On: 10/27/2019 13:35   DG Swallowing Func-Speech Pathology  Result Date: 10/26/2019 Objective Swallowing Evaluation: Type of Study: MBS-Modified Barium Swallow Study  Patient Details Name: AYASHA ELLINGSEN MRN: [id number] Date of Birth: Aug 31, 1934 Today's Date: 10/26/2019 Time: SLP Start Time (ACUTE ONLY): 1340 -SLP Stop Time (ACUTE ONLY): 3557 SLP Time Calculation (Jackson) (ACUTE ONLY): 15 Jackson Past Medical History: Past Medical History: Diagnosis Date  Acute respiratory failure with hypoxia ([place]) 05/20/2017  Anemia, unspecified 10/28/2012   Anxiety state, unspecified 10/28/2012  CAD (coronary artery disease)   a. Stent RCA in [facility];  b. Cath approx 2009 - nonobs per pt report.  Cataract   immature on the left eye  Chronic insomnia 02/07/2013  Chronic lower back pain   scoliosis  CKD (chronic kidney disease) stage 3, GFR 30-59 ml/Jackson   CVA (cerebral infarction) 10/29/2012  DDD (degenerative disc disease)   Depression   Diverticulosis   GERD (gastroesophageal reflux disease) 09/01/2010  Hemorrhoids   Herniated nucleus pulposus, L5-S1, right 11/04/2015  Hyperlipidemia   takes Lipitor daily  Hypertension   takes Amlodipine,Losartan,Metoprolol,and HCTZ daily  Incontinence of urine   Insomnia   takes Restoril nightly  Lumbar stenosis 04/25/2013  Major depressive disorder, recurrent episode, moderate ([place]) 07/16/2013  Osteoporosis   PAF (paroxysmal atrial fibrillation) ([place]) 2011  a. lone epidode in 2011 according to notes.  Rheumatoid arthritis ([place])   Scoliosis   Sepsis ([place]) 05/2019  Temporal arteritis ([place]) 2011  a. followed @ [facility]; potential flareup 10/28/2012/notes 10/28/2012  Vocal cord dysfunction   "they don't operate properly" (10/28/2012) Past Surgical History: Past Surgical History: Procedure Laterality Date  ABDOMINAL HYSTERECTOMY  ~ 1984  vaginally  BACK SURGERY  7-42yrs ago  X Stop  BLADDER SUSPENSION  2001  BREAST BIOPSY Right   CATARACT EXTRACTION W/ INTRAOCULAR LENS IMPLANT Right ~ 08/2012  CHEST TUBE INSERTION Left 03/08/2019  Procedure: Chest Tube Insertion;  Surgeon: Ivin Poot, MD;  Location: [facility];  Service: Thoracic;  Laterality: Left;  COLONOSCOPY  01/26/2012  Procedure: COLONOSCOPY;  Surgeon: Ladene Artist, MD,FACG;  Location: [facility] ENDOSCOPY;  Service: Endoscopy;  Laterality: N/A;  note the EGD is possible  CORONARY ANGIOPLASTY WITH STENT PLACEMENT  2006  X 1 stent  EPIDURAL BLOCK INJECTION    ESOPHAGOGASTRODUODENOSCOPY  01/26/2012  Procedure: ESOPHAGOGASTRODUODENOSCOPY (EGD);  Surgeon: Ladene Artist, MD,FACG;  Location: [facility]  ENDOSCOPY;  Service: Endoscopy;  Laterality: N/A;  FINE NEEDLE ASPIRATION Right 09/26/2019  Procedure: FINE NEEDLE ASPIRATION;  Surgeon: Altamese Edgerton, MD;  Location: [facility];  Service: Orthopedics;  Laterality: Right;  HEMIARTHROPLASTY HIP Right 2012  IR EPIDUROGRAPHY  04/20/2019  LAPAROSCOPIC CHOLECYSTECTOMY  2001  LUMBAR FUSION  03/2013  LUMBAR LAMINECTOMY/DECOMPRESSION MICRODISCECTOMY Right 11/04/2015  Procedure: Right Lumbar Five-Sacral One Microdiskectomy;  Surgeon: Kristeen Miss, MD;  Location[place] NEURO ORS;  Service: Neurosurgery;  Laterality: Right;  Right L5-S1 Microdiskectomy  moles removed that required stiches    one on leg and one on face  ORIF FEMUR FRACTURE Right 09/26/2019  Procedure: OPEN  REDUCTION INTERNAL FIXATION (ORIF) DISTAL FEMUR FRACTURE;  Surgeon: Altamese Millbourne, MD;  Location: [facility];  Service: Orthopedics;  Laterality: Right;  ORIF FEMUR FRACTURE Right 10/17/2019  Procedure: OPEN REDUCTION INTERNAL FIXATION (ORIF) DISTAL FEMUR FRACTURE;  Surgeon: Altamese [place], MD;  Location: [facility];  Service: Orthopedics;  Laterality: Right;  SUBXYPHOID PERICARDIAL WINDOW N/A 03/08/2019  Procedure: SUBXYPHOID PERICARDIAL WINDOW;  Surgeon: Ivin Poot, MD;  Location: [facility];  Service: Thoracic;  Laterality: N/A;  TEE WITHOUT CARDIOVERSION N/A 03/08/2019  Procedure: TRANSESOPHAGEAL ECHOCARDIOGRAM (TEE);  Surgeon: Prescott Gum, Collier Salina, MD;  Location: [facility];  Service: Thoracic;  Laterality: N/A;  TEMPORAL ARTERY BIOPSY / LIGATION Bilateral 2011  TONSILLECTOMY AND ADENOIDECTOMY    at age 62  [place]  ~ 8  [place]  ~ 2010  "lower back" (10/28/2012) HPI: Pt is an 85 y.o. 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  She underwent ORIF on 7/20. She was  discharged to skilled nursing facility on 7/26 and returned to the ED due to presence of fever. CXR 7/28: Slight worsening atelectasis or mild infiltrate at both lung bases. BSE 09/27/19: normal oropharyngeal swallow  No data recorded Assessment / Plan / Recommendation CHL IP CLINICAL IMPRESSIONS 10/26/2019 Clinical Impression Pt was seen in radiology suite for modified barium swallow study. Trials of puree solids, mixed consistency boluses (mechanical soft solids with thin liquids), regular texture solids, a 13 mm barium tablet, and thin liquids via cup and straw were administered. Pt's oropharyngeal swallow mechanism was within normal limits. Transient penetration (PAS 2) was noted with consecutive swallows of thin liquids, but this is considered to be within normal limits and no instances of aspiration were demonstrated. Considering pt's presentation at bedside, with coughing following consecutive swallows, SLP anticipates that depth of laryngeal invasion likely increases with sub-optimal positioning. It is recommended that a regular texture diet with thin liquids be initiated with observance of swallowing precautions. SLP will follow briefly to ensure tolerance of the diet and observance of precautions. However, further skilled SLP services will likely not be needed beyond that.  SLP Visit Diagnosis Dysphagia, unspecified (R13.10) Attention and concentration deficit following -- Frontal lobe and executive function deficit following -- Impact on safety and function No limitations   CHL IP TREATMENT RECOMMENDATION 10/26/2019 Treatment Recommendations Therapy as outlined in treatment plan below   Prognosis 10/26/2019 Prognosis for Safe Diet Advancement Good Barriers to Reach Goals -- Barriers/Prognosis Comment -- CHL IP DIET RECOMMENDATION 10/26/2019 SLP Diet Recommendations Regular solids;Thin liquid Liquid Administration via Cup;Straw Medication Administration Whole meds with liquid Compensations Slow rate Postural  Changes Seated upright at 90 degrees   CHL IP OTHER RECOMMENDATIONS 10/26/2019 Recommended Consults -- Oral Care Recommendations Oral care BID Other Recommendations --   CHL IP FOLLOW UP RECOMMENDATIONS 10/26/2019 Follow up Recommendations Other (comment)   CHL IP FREQUENCY AND DURATION 10/26/2019 Speech Therapy Frequency (ACUTE ONLY) Jackson 2x/week Treatment Duration 1 week      CHL IP ORAL PHASE 10/26/2019 Oral Phase WFL Oral - Pudding Teaspoon -- Oral - Pudding Cup -- Oral - Honey Teaspoon -- Oral - Honey Cup -- Oral - Nectar Teaspoon -- Oral - Nectar Cup -- Oral - Nectar Straw -- Oral - Thin Teaspoon -- Oral - Thin Cup -- Oral -  Thin Straw -- Oral - Puree -- Oral - Mech Soft -- Oral - Regular -- Oral - Multi-Consistency -- Oral - Pill -- Oral Phase - Comment --  CHL IP PHARYNGEAL PHASE 10/26/2019 Pharyngeal Phase WFL Pharyngeal- Pudding Teaspoon -- Pharyngeal -- Pharyngeal- Pudding Cup -- Pharyngeal -- Pharyngeal- Honey Teaspoon -- Pharyngeal -- Pharyngeal- Honey Cup -- Pharyngeal -- Pharyngeal- Nectar Teaspoon -- Pharyngeal -- Pharyngeal- Nectar Cup -- Pharyngeal -- Pharyngeal- Nectar Straw -- Pharyngeal -- Pharyngeal- Thin Teaspoon -- Pharyngeal -- Pharyngeal- Thin Cup -- Pharyngeal -- Pharyngeal- Thin Straw -- Pharyngeal -- Pharyngeal- Puree -- Pharyngeal -- Pharyngeal- Mechanical Soft -- Pharyngeal -- Pharyngeal- Regular -- Pharyngeal -- Pharyngeal- Multi-consistency -- Pharyngeal -- Pharyngeal- Pill -- Pharyngeal -- Pharyngeal Comment --  CHL IP CERVICAL ESOPHAGEAL PHASE 10/26/2019 Cervical Esophageal Phase WFL Pudding Teaspoon -- Pudding Cup -- Honey Teaspoon -- Honey Cup -- Nectar Teaspoon -- Nectar Cup -- Nectar Straw -- Thin Teaspoon -- Thin Cup -- Thin Straw -- Puree -- Mechanical Soft -- Regular -- Multi-consistency -- Pill -- Cervical Esophageal Comment -- Shanika I. Hardin Negus, [place], [place] Office number [phone] Pager [place] 10/26/2019, 5:07 PM                 Medical Consultants:   None.  Anti-Infectives:   IV vancomycin and cefepime  Subjective:    Shahd L Pontius relates her pain is more in her right index finger  Objective:    Vitals:   10/27/19 1953 10/27/19 2000 10/28/19 0340 10/28/19 0803  BP: (!) 145/68 (!) 154/61 (!) 158/62 (!) 157/68  Pulse: 87 87 80 81  Resp: 18 15 16 19   Temp: 98.7 F (37.1 C) 98.8 F (37.1 C) 98.5 F (36.9 C) 98.1 F (36.7 C)  TempSrc: Oral Oral Oral Oral  SpO2: 98% 98% 96% 97%  Weight:   72.6 kg   Height:       SpO2: 97 %   Intake/Output Summary (Last 24 hours) at 10/28/2019 0914 Last data filed at 10/28/2019 0141 Gross per 24 hour  Intake 1320 ml  Output 925 ml  Net 395 ml   Filed Weights   10/26/19 0140 10/27/19 0421 10/28/19 0340  Weight: 70.3 kg 69.4 kg 72.6 kg    Exam: General exam: In no acute distress. Respiratory system: Good air movement and clear to auscultation. Cardiovascular system: S1 & S2 heard, RRR. No JVD. Gastrointestinal system: Abdomen is nondistended, soft and nontender.  Extremities: Right hand and swollen Skin: No rashes, lesions or ulcers Psychiatry: Judgement and insight appear normal. Mood & affect appropriate.   Data Reviewed:    Labs: Basic Metabolic Panel: Recent Labs  Lab 10/22/19 0302 10/22/19 0302 10/25/19 1745 10/25/19 1745 10/26/19 0432 10/27/19 1058  NA 138  --  135  --  138 138  K 3.2*   < > 3.6   < > 3.9 3.0*  CL 98  --  97*  --  103 106  CO2 30  --  26  --  23 22  GLUCOSE 99  --  109*  --  121* 142*  BUN 9  --  13  --  10 10  CREATININE 0.72  --  1.23*  --  0.85 0.72  CALCIUM 8.7*  --  8.6*  --  8.1* 8.4*  MG 1.6*  --   --   --   --   --    < > = values in this interval not displayed.  GFR Estimated Creatinine Clearance: 52.1 mL/Jackson (by C-G formula based on SCr of 0.72 mg/dL). Liver Function Tests: Recent Labs  Lab 10/25/19 1745 10/26/19 0432  AST 22 17  ALT 19 16  ALKPHOS 146* 121  BILITOT 2.4* 1.6*  PROT  6.2* 5.1*  ALBUMIN 2.6* 2.1*   No results for input(s): LIPASE, AMYLASE in the last 168 hours. No results for input(s): AMMONIA in the last 168 hours. Coagulation profile Recent Labs  Lab 10/25/19 1745  INR 1.3*   COVID-19 Labs  No results for input(s): DDIMER, FERRITIN, LDH, CRP in the last 72 hours.  Lab Results  Component Value Date   SARSCOV2NAA NEGATIVE 10/25/2019   [place] NEGATIVE 10/23/2019   [place] NEGATIVE 10/17/2019   [place] NEGATIVE 09/28/2019    CBC: Recent Labs  Lab 10/22/19 0302 10/25/19 1745 10/26/19 0432 10/27/19 1058  WBC 9.4 15.8* 15.2* 13.1*  NEUTROABS 6.2 12.1*  --  11.2*  HGB 11.1* 11.3* 9.9* 9.0*  HCT 34.4* 35.6* 30.1* 28.2*  MCV 91.0 93.4 91.8 92.2  PLT 222 338 286 346   Cardiac Enzymes: No results for input(s): CKTOTAL, CKMB, CKMBINDEX, TROPONINI in the last 168 hours. BNP (last 3 results) No results for input(s): PROBNP in the last 8760 hours. CBG: No results for input(s): GLUCAP in the last 168 hours. D-Dimer: No results for input(s): DDIMER in the last 72 hours. Hgb A1c: No results for input(s): HGBA1C in the last 72 hours. Lipid Profile: No results for input(s): CHOL, HDL, LDLCALC, TRIG, CHOLHDL, LDLDIRECT in the last 72 hours. Thyroid function studies: No results for input(s): TSH, T4TOTAL, T3FREE, THYROIDAB in the last 72 hours.  Invalid input(s): FREET3 Anemia work up: No results for input(s): VITAMINB12, FOLATE, FERRITIN, TIBC, IRON, RETICCTPCT in the last 72 hours. Sepsis Labs: Recent Labs  Lab 10/22/19 0302 10/25/19 1744 10/25/19 1745 10/25/19 1944 10/26/19 0055 10/26/19 0432 10/27/19 1058  WBC 9.4  --  15.8*  --   --  15.2* 13.1*  LATICACIDVEN  --  1.6  --  2.0* 1.2  --   --    Microbiology Recent Results (from the past 240 hour(s))  SARS CORONAVIRUS 2 (TAT 6-24 HRS) Nasopharyngeal Nasopharyngeal Swab     Status: None   Collection Time: 10/23/19  3:42 AM   Specimen: Nasopharyngeal Swab  Result  Value Ref Range Status   SARS Coronavirus 2 NEGATIVE NEGATIVE Final    Comment: (NOTE) SARS-CoV-2 target nucleic acids are NOT DETECTED.  The SARS-CoV-2 RNA is generally detectable in upper and lower respiratory specimens during the acute phase of infection. Negative results do not preclude SARS-CoV-2 infection, do not rule out co-infections with other pathogens, and should not be used as the sole basis for treatment or other patient management decisions. Negative results must be combined with clinical observations, patient history, and epidemiological information. The expected result is Negative.  Fact Sheet for Patients: SugarRoll.be  Fact Sheet for Healthcare Providers: https://www.woods-mathews.com/  This test is not yet approved or cleared by the [place] FDA and  has been authorized for detection and/or diagnosis of SARS-CoV-2 by FDA under an Emergency Use Authorization (EUA). This EUA will remain  in effect (meaning this test can be used) for the duration of the COVID-19 declaration under Se ction 564(b)(1) of the Act, 21 U.S.C. section 360bbb-3(b)(1), unless the authorization is terminated or revoked sooner.  Performed at [facility], [place] [address] [place], [place] [address]   Urine culture     Status: None  Collection Time: 10/25/19  5:16 PM   Specimen: In/Out Cath Urine  Result Value Ref Range Status   Specimen Description IN/OUT CATH URINE  Final   Special Requests NONE  Final   Culture   Final    NO GROWTH Performed at [facility], [place] [address], [place], [place] [address]    Report Status 10/26/2019 FINAL  Final  Blood Culture (routine x 2)     Status: None (Preliminary result)   Collection Time: 10/25/19  5:45 PM   Specimen: BLOOD  Result Value Ref Range Status   Specimen Description BLOOD RIGHT ANTECUBITAL  Final   Special Requests   Final    BOTTLES DRAWN AEROBIC AND ANAEROBIC Blood Culture  adequate volume   Culture   Final    NO GROWTH 2 DAYS Performed at [facility], [place] [address], [place], [place] [address]    Report Status PENDING  Incomplete  Blood Culture (routine x 2)     Status: None (Preliminary result)   Collection Time: 10/25/19  5:45 PM   Specimen: BLOOD RIGHT WRIST  Result Value Ref Range Status   Specimen Description BLOOD RIGHT WRIST  Final   Special Requests   Final    BOTTLES DRAWN AEROBIC ONLY Blood Culture results may not be optimal due to an inadequate volume of blood received in culture bottles   Culture   Final    NO GROWTH 2 DAYS Performed at [facility], [place] [address], [place], [place] [address]    Report Status PENDING  Incomplete  SARS Coronavirus 2 by RT PCR (hospital order, performed in [facility] hospital lab) Nasopharyngeal Nasopharyngeal Swab     Status: None   Collection Time: 10/25/19  8:31 PM   Specimen: Nasopharyngeal Swab  Result Value Ref Range Status   SARS Coronavirus 2 NEGATIVE NEGATIVE Final    Comment: (NOTE) SARS-CoV-2 target nucleic acids are NOT DETECTED.  The SARS-CoV-2 RNA is generally detectable in upper and lower respiratory specimens during the acute phase of infection. The lowest concentration of SARS-CoV-2 viral copies this assay can detect is 250 copies / mL. A negative result does not preclude SARS-CoV-2 infection and should not be used as the sole basis for treatment or other patient management decisions.  A negative result may occur with improper specimen collection / handling, submission of specimen other than nasopharyngeal swab, presence of viral mutation(s) within the areas targeted by this assay, and inadequate number of viral copies (<250 copies / mL). A negative result must be combined with clinical observations, patient history, and epidemiological information.  Fact Sheet for Patients:   StrictlyIdeas.no  Fact Sheet for Healthcare  Providers: BankingDealers.co.za  This test is not yet approved or  cleared by the [place] FDA and has been authorized for detection and/or diagnosis of SARS-CoV-2 by FDA under an Emergency Use Authorization (EUA).  This EUA will remain in effect (meaning this test can be used) for the duration of the COVID-19 declaration under Section 564(b)(1) of the Act, 21 U.S.C. section 360bbb-3(b)(1), unless the authorization is terminated or revoked sooner.  Performed at [facility], [place] [address], [place],  [address]   MRSA PCR Screening     Status: Abnormal   Collection Time: 10/26/19  9:05 AM   Specimen: Nasopharyngeal  Result Value Ref Range Status   MRSA by PCR POSITIVE (A) NEGATIVE Final    Comment:        The GeneXpert MRSA  Assay (FDA approved for NASAL specimens only), is one component of a comprehensive MRSA colonization surveillance program. It is not intended to diagnose MRSA infection nor to guide or monitor treatment for MRSA infections. RESULT CALLED TO, READ BACK BY AND VERIFIED WITH: Melony Overly RN 11:30 10/26/19 ([email or web address]) Performed at [facility], [place] [address], [place], [place] [address]      Medications:    Chlorhexidine Gluconate Cloth  6 each Topical Daily   diltiazem  240 mg Oral Daily   enoxaparin (LOVENOX) injection  40 mg Subcutaneous Q24H   escitalopram  10 mg Oral Daily   fentaNYL  1 patch Transdermal Q72H   metoprolol tartrate  50 mg Oral BID   mupirocin ointment  1 application Nasal BID   predniSONE  10 mg Oral Q breakfast   saccharomyces boulardii  250 mg Oral BID   Continuous Infusions:  ceFEPime (MAXIPIME) IV 2 g (10/28/19 0603)   vancomycin 500 mg (10/28/19 0041)      LOS: 3 days   Charlynne Cousins  [facility]  10/28/2019, 9:14 AM

## 2019-10-28 NOTE — Op Note (Signed)
NAME: Betty Jackson, Betty Jackson. MEDICAL RECORD [id number] ACCOUNT [id number] DATE OF BIRTH:06-14-1934 FACILITY: [facility] LOCATION: [facility] PHYSICIAN:Ayiden Milliman H. Lempi Edwin, MD  OPERATIVE REPORT  DATE OF PROCEDURE:  10/17/2019  PREOPERATIVE DIAGNOSES:   1.  Displaced right supracondylar femur fracture. 2.  Loose hardware, right medial distal femur, status post recent open reduction and internal fixation of medial condyle fracture.  POSTOPERATIVE DIAGNOSES: 1.  Displaced right supracondylar femur fracture. 2.  Loose hardware, right medial distal femur, status post recent open reduction and internal fixation of medial condyle fracture.  PROCEDURES:   1.  Open reduction internal fixation of right distal femur fracture without new intercondylar extension  using a Biomet NCB plate. 2.  Removal of loose and partially migrated deep implant, right distal medial femur.  SURGEON:  Altamese [place], MD  ASSISTANT:  Ainsley Spinner, PA-C.  ANESTHESIA:  General.  COMPLICATIONS:  None.  TOURNIQUET:  None.   INS AND OUTS:  Please refer to anesthetic record.  DISPOSITION:  To PACU.  CONDITION:  Stable.  INDICATIONS FOR PROCEDURE:  The patient is a pleasant, walker dependent female patient of mine who has a past medical history notable for encephalopathy and confusion, who recently underwent repair of a medial femoral condyle fracture a little over 2  weeks ago.  She was seen in the office within the last 48 hours with perfect-appearing x-rays and a healed wound.  However, subsequent to that, she awoke in the middle of the night and fell on her walker while trying to go to the bathroom, resulting in  disruption of her previous repair with partial migration of her medial femoral hardware, as well as a complete supracondylar fracture.  I discussed with her and her family the risks and benefits of surgical repair, including the possibility of infection,  nerve injury, vessel injury, DVT, PE, malunion, nonunion, subsequent  falls and need for further surgery, among others.  After acknowledgement of these risks, consent was provided to proceed.  BRIEF SUMMARY OF PROCEDURE:  The patient was taken to the operating room where general anesthesia was induced.  She did receive preoperative antibiotics.  I began with an attempt to obtain closed reduction without of removal of the hardware medially, but  this was not feasible.  Consequently, I had to reopen the medial incision and remove the implants down to the deep bone level with a small frag screwdriver.  This area was irrigated and packed open.  I then went laterally where a standard incision was  made at the distal articular block and using x-ray to make sure that the plate would be proximal enough to overlap with the stem and reduce a stress riser at this area.  This was followed by securing the distal block with a standard fixation and a  proximal block in the cement mantle below the stem.  Additional standard fixation was placed proximally and distally, including 2 unicortical screws overlapping the stem at its extreme extent.  Final images showed excellent alignment with just slight  translation of the bridged segment.  There were no complications during the procedure.  Thorough irrigation and standard layer closure was performed.  The patient was taken to PACU in stable condition.  Ainsley Spinner, PA-C, was present and assisting  throughout and his assistance was necessary to obtain and maintain reduction during instrumentation and he also assisted with closure.  PROGNOSIS:  The patient will be nonweightbearing on the right lower extremity for the next 6 weeks with unrestricted range of motion of the knee, ankle  and hip.  She remains at elevated risk of complications given her mental status changes and associated  comorbidities.  VN/NUANCE  D:10/28/2019 T:10/28/2019 JOB:[id number]

## 2019-10-29 LAB — BASIC METABOLIC PANEL
Anion gap: 8 (ref 5–15)
BUN: 10 mg/dL (ref 8–23)
CO2: 25 mmol/L (ref 22–32)
Calcium: 8.8 mg/dL — ABNORMAL LOW (ref 8.9–10.3)
Chloride: 103 mmol/L (ref 98–111)
Creatinine, Ser: 0.65 mg/dL (ref 0.44–1.00)
GFR calc Af Amer: >60 mL/min (ref >60)
GFR calc non Af Amer: >60 mL/min (ref >60)
Glucose, Bld: 89 mg/dL (ref 70–99)
Potassium: 4.9 mmol/L (ref 3.5–5.1)
Sodium: 136 mmol/L (ref 135–145)

## 2019-10-29 NOTE — Progress Notes (Addendum)
[facility] PROGRESS NOTE    Progress Note  LORILEI HORAN  BLT:[id number] DOB: 23-Jun-1934 DOA: 10/25/2019 PCP: Eulas Post, MD     Brief Narrative:   Betty Jackson is an 85 y.o. 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 difficile for which she was hospitalized twice.  Was been hospitalized twice in the last month for 2 different fractures of her right femur underwent ORIF in 10/17/2019 transfuse 1 unit of packed red blood cells and discharged to skilled nursing facility on 10/23/2019.  Comes back to the ED for fevers and being lethargic at skilled nursing facility.  Per report from the nursing home she has had poor oral intake over the last 48 hours in the ED she was found to have a temperature of 101 and mildly tachypneic chest x-ray shows pneumonia.  Assessment/Plan:   Sepsis due to HCAP (healthcare-associated pneumonia): She came from [facility] home facility. Speech evaluated the patient did deemed her low risk for aspiration. SARS-CoV-2 PCR was negative on admission. Continue oral doxycycline and Augmentin. Palliative Care to follow-up at facility to address goals of care. Patient is medically stable to transfer to skilled nursing facility.  Acute metabolic encephalopathy/acute confusional state: Multifactorial in the setting of infectious etiology and dehydration she is nonfocal physical exam. She is developing episodes of acute confusional state.  Acute kidney injury on chronic kidney disease stage IIIa: Baseline creatinine is less than 1, on admission 1.3 fluid resuscitated in the ED now her creatinine is returned to baseline.  New right cellulitis: She was on IV Vanco and cefepime, she has remained afebrile her leukocytosis is  improving. Imaging showed no fracture or dislocation.  Normocytic anemia: Hemoglobin seems to be stable compared to previous admissions.  Paroxysmal atrial fibrillation: Controlled on Cardizem and metoprolol not on anticoagulation due to multiple falls. In the house she is in DVT prophylaxis heart rate is rate controlled.  Chronic diastolic heart failure: Still appears euvolemic on physical exam, her urine is seems quite concentrated, continue to hold her Lasix strict I's and O's and daily weights.  Giant cell arteritis: On chronic steroids, she does not appear to be in adrenal insufficiency her blood pressure and heart rate are stable blood glucose is unremarkable. She was given a shot of hydrocortisone intravenously in the ED we will transition her to her current home dose of prednisone.  CAD: Noted.  Chronic pain syndrome: This could be contributing to her encephalopathy in the setting of acute kidney injury. We will reinitiate once her mentation is improved.  Stage II sacral decubitus ulcer present on admission. RN Pressure Injury Documentation: Pressure Injury 10/17/19 Heel Right Stage 2 -  Partial thickness loss of dermis presenting as a shallow open injury with a red, pink wound bed without slough. (Active)  10/17/19 2100  Location: Heel  Location Orientation: Right  Staging: Stage 2 -  Partial thickness loss of dermis presenting as a shallow open injury with a red, pink wound bed without slough.  Wound Description (Comments):   Present on Admission: Yes    Estimated body mass index is 27.81 kg/m as calculated from the following:   Height as of this encounter: 5\' 4"  (1.626 m).   Weight as of this encounter: 73.5 kg.    DVT prophylaxis: lovenxo Family  Communication:none Status is: Inpatient  Remains inpatient appropriate because:Hemodynamically unstable   Dispo: The patient is from: SNF              Anticipated d/c is to: SNF              Anticipated d/c date  is: 1 days              Patient currently is not medically stable to d/c.  To skilled nursing facility tomorrow morning.        Code Status:     Code Status Orders  (From admission, onward)           Start     Ordered   10/25/19 2014  Do not attempt resuscitation (DNR)  Continuous       Question Answer Comment  In the event of cardiac or respiratory ARREST Do not call a "code blue"   In the event of cardiac or respiratory ARREST Do not perform Intubation, CPR, defibrillation or ACLS   In the event of cardiac or respiratory ARREST Use medication by any route, position, wound care, and other measures to relive pain and suffering. May use oxygen, suction and manual treatment of airway obstruction as needed for comfort.      10/25/19 2013           Code Status History     Date Active Date Inactive Code Status Order ID Comments User Context   10/17/2019 0310 10/23/2019 2327 DNR [id number]  Etta Quill, DO ED   09/25/2019 2029 09/30/2019 0331 DNR [id number]  Toy Baker, MD Inpatient   06/19/2019 2346 06/28/2019 0212 DNR [id number]  Shela Leff, MD ED   06/19/2019 2346 06/19/2019 2346 Full Code [id number]  Shela Leff, MD ED   05/29/2019 0111 06/01/2019 2315 DNR [id number]  Shela Leff, MD ED   04/15/2019 1608 04/21/2019 1833 DNR [id number]  Lequita Halt, MD ED   03/09/2019 0828 03/13/2019 1649 DNR [id number]  Cristal Generous, NP Inpatient   03/08/2019 1225 03/09/2019 0828 Full Code [id number]  Ivin Poot, MD Inpatient   03/06/2019 1802 03/08/2019 1224 DNR [id number]  Candee Furbish, MD Inpatient   03/06/2019 1757 03/06/2019 1802 Full Code [id number]  Candee Furbish, MD Inpatient   01/13/2019 1734 01/20/2019 2138 DNR [id number]  Guilford Shi, MD ED   01/13/2019 1634 01/13/2019 1734 Full Code [id number]  Guilford Shi, MD ED   05/20/2017 1542 05/26/2017 2033 DNR [id number]  Samella Parr, NP ED   11/04/2015 1910 11/05/2015 1658 Full Code [id number]  Kristeen Miss, MD Inpatient   04/25/2013 2250 05/01/2013 1923 Full Code [id number]  Kristeen Miss, MD Inpatient   10/28/2012 1850 10/30/2012 1701 Full Code [id number]  Eugenie Filler, MD Inpatient   Advance Care Planning Activity      Advance Directive Documentation      Most Recent Value  Type of Advance Directive Healthcare Power of Attorney, Living will  Pre-existing out of facility DNR order (yellow form or pink MOST form) --  "MOST" Form in Place? --         IV Access:    Peripheral IV   Procedures and diagnostic studies:   DG Forearm Right  Result Date: 10/27/2019 CLINICAL DATA:  Right forearm pain.  No known injury. EXAM: RIGHT FOREARM - 2 VIEW COMPARISON:  No recent. FINDINGS: IV catheter noted. Small benign-appearing soft tissue calcification noted over the posterior aspect of the upper extremity. Degenerative changes right wrist and elbow.  No acute bony or joint abnormality identified. Peripheral vascular calcification. IMPRESSION: 1. Degenerative changes right wrist and elbow. No acute bony abnormality. 2.  Peripheral vascular disease. Electronically Signed   By: Marcello Moores  Register   On: 10/27/2019 13:35   DG Hand 2 View Right  Result Date: 10/28/2019 CLINICAL DATA:  RIGHT hand pain and swelling across second through fifth MCP joints. No known injury. EXAM: RIGHT HAND - 2 VIEW COMPARISON:  None. FINDINGS: No acute appearing osseous abnormality. Soft tissues about the RIGHT hand are unremarkable. Degenerative osteoarthritic changes are seen throughout the IP and MCP joints, mild to moderate in degree. IMPRESSION: 1. No acute findings. 2. 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 Electronically Signed   By: Franki Cabot M.D.   On: 10/28/2019 13:00     Medical Consultants:    None.  Anti-Infectives:   IV vancomycin and cefepime  Subjective:    Lusero L Quinton no new complaints.  Objective:    Vitals:    10/28/19 0803 10/28/19 2023 10/29/19 0446 10/29/19 0919  BP: (!) 157/68 (!) 165/77  (!) 149/66  Pulse: 81 76  89  Resp: 19 19    Temp: 98.1 F (36.7 C) 98.3 F (36.8 C)  98.2 F (36.8 C)  TempSrc: Oral Oral  Oral  SpO2: 97% 97%  97%  Weight:   73.5 kg   Height:       SpO2: 97 %   Intake/Output Summary (Last 24 hours) at 10/29/2019 1031 Last data filed at 10/29/2019 0900 Gross per 24 hour  Intake 600 ml  Output 1500 ml  Net -900 ml   Filed Weights   10/27/19 0421 10/28/19 0340 10/29/19 0446  Weight: 69.4 kg 72.6 kg 73.5 kg    Exam: General exam: In no acute distress. Respiratory system: Good air movement and clear to auscultation. Cardiovascular system: S1 & S2 heard, RRR. No JVD. Gastrointestinal system: Abdomen is nondistended, soft and nontender.  Extremities: No pedal edema. Skin: No rashes, lesions or ulcers  Data Reviewed:    Labs: Basic Metabolic Panel: Recent Labs  Lab 10/25/19 1745 10/25/19 1745 10/26/19 0432 10/26/19 0432 10/27/19 1058 10/29/19 0614  NA 135  --  138  --  138 136  K 3.6   < > 3.9   < > 3.0* 4.9  CL 97*  --  103  --  106 103  CO2 26  --  23  --  22 25  GLUCOSE 109*  --  121*  --  142* 89  BUN 13  --  10  --  10 10  CREATININE 1.23*  --  0.85  --  0.72 0.65  CALCIUM 8.6*  --  8.1*  --  8.4* 8.8*   < > = values in this interval not displayed.   GFR Estimated Creatinine Clearance: 52.3 mL/min (by C-G formula based on SCr of 0.65 mg/dL). Liver Function Tests: Recent Labs  Lab 10/25/19 1745 10/26/19 0432  AST 22 17  ALT 19 16  ALKPHOS 146* 121  BILITOT 2.4* 1.6*  PROT 6.2* 5.1*  ALBUMIN 2.6* 2.1*   No results for input(s): LIPASE, AMYLASE in the last 168 hours. No results for input(s): AMMONIA in the last 168 hours. Coagulation profile Recent Labs  Lab 10/25/19 1745  INR 1.3*   COVID-19 Labs  No results for input(s): DDIMER, FERRITIN, LDH, CRP in the last 72 hours.  Lab Results  Component Value  Date   SARSCOV2NAA  NEGATIVE 10/25/2019   SARSCOV2NAA NEGATIVE 10/23/2019   [place] NEGATIVE 10/17/2019   [facility] NEGATIVE 09/28/2019    CBC: Recent Labs  Lab 10/25/19 1745 10/26/19 0432 10/27/19 1058  WBC 15.8* 15.2* 13.1*  NEUTROABS 12.1*  --  11.2*  HGB 11.3* 9.9* 9.0*  HCT 35.6* 30.1* 28.2*  MCV 93.4 91.8 92.2  PLT 338 286 346   Cardiac Enzymes: No results for input(s): CKTOTAL, CKMB, CKMBINDEX, TROPONINI in the last 168 hours. BNP (last 3 results) No results for input(s): PROBNP in the last 8760 hours. CBG: No results for input(s): GLUCAP in the last 168 hours. D-Dimer: No results for input(s): DDIMER in the last 72 hours. Hgb A1c: No results for input(s): HGBA1C in the last 72 hours. Lipid Profile: No results for input(s): CHOL, HDL, LDLCALC, TRIG, CHOLHDL, LDLDIRECT in the last 72 hours. Thyroid function studies: No results for input(s): TSH, T4TOTAL, T3FREE, THYROIDAB in the last 72 hours.  Invalid input(s): FREET3 Anemia work up: No results for input(s): VITAMINB12, FOLATE, FERRITIN, TIBC, IRON, RETICCTPCT in the last 72 hours. Sepsis Labs: Recent Labs  Lab 10/25/19 1744 10/25/19 1745 10/25/19 1944 10/26/19 0055 10/26/19 0432 10/27/19 1058  WBC  --  15.8*  --   --  15.2* 13.1*  LATICACIDVEN 1.6  --  2.0* 1.2  --   --    Microbiology Recent Results (from the past 240 hour(s))  SARS CORONAVIRUS 2 (TAT 6-24 HRS) Nasopharyngeal Nasopharyngeal Swab     Status: None   Collection Time: 10/23/19  3:42 AM   Specimen: Nasopharyngeal Swab  Result Value Ref Range Status   SARS Coronavirus 2 NEGATIVE NEGATIVE Final    Comment: (NOTE) SARS-CoV-2 target nucleic acids are NOT DETECTED.  The SARS-CoV-2 RNA is generally detectable in upper and lower respiratory specimens during the acute phase of infection. Negative results do not preclude SARS-CoV-2 infection, do not rule out co-infections with other pathogens, and should not be used as the sole basis for treatment or other  patient management decisions. Negative results must be combined with clinical observations, patient history, and epidemiological information. The expected result is Negative.  Fact Sheet for Patients: SugarRoll.be  Fact Sheet for Healthcare Providers: https://www.woods-mathews.com/  This test is not yet approved or cleared by the [place] FDA and  has been authorized for detection and/or diagnosis of SARS-CoV-2 by FDA under an Emergency Use Authorization (EUA). This EUA will remain  in effect (meaning this test can be used) for the duration of the COVID-19 declaration under Se ction 564(b)(1) of the Act, 21 U.S.C. section 360bbb-3(b)(1), unless the authorization is terminated or revoked sooner.  Performed at [facility], [place] [address], [place], [place] [address]   Urine culture     Status: None   Collection Time: 10/25/19  5:16 PM   Specimen: In/Out Cath Urine  Result Value Ref Range Status   Specimen Description IN/OUT CATH URINE  Final   Special Requests NONE  Final   Culture   Final    NO GROWTH Performed at [facility], [place] [address], [place], [place] [address]    Report Status 10/26/2019 FINAL  Final  Blood Culture (routine x 2)     Status: None (Preliminary result)   Collection Time: 10/25/19  5:45 PM   Specimen: BLOOD  Result Value Ref Range Status   Specimen Description BLOOD RIGHT ANTECUBITAL  Final   Special Requests   Final    BOTTLES DRAWN AEROBIC  AND ANAEROBIC Blood Culture adequate volume   Culture   Final    NO GROWTH 4 DAYS Performed at [place] [address] [place], [place] [address]    Report Status PENDING  Incomplete  Blood Culture (routine x 2)     Status: None (Preliminary result)   Collection Time: 10/25/19  5:45 PM   Specimen: BLOOD RIGHT WRIST  Result Value Ref Range Status   Specimen Description BLOOD RIGHT WRIST  Final   Special Requests   Final    BOTTLES DRAWN  AEROBIC ONLY Blood Culture results may not be optimal due to an inadequate volume of blood received in culture bottles   Culture   Final    NO GROWTH 4 DAYS Performed at [facility], [place] [address], [place], [place] [address]    Report Status PENDING  Incomplete  SARS Coronavirus 2 by RT PCR (hospital order, performed in [facility] hospital lab) Nasopharyngeal Nasopharyngeal Swab     Status: None   Collection Time: 10/25/19  8:31 PM   Specimen: Nasopharyngeal Swab  Result Value Ref Range Status   SARS Coronavirus 2 NEGATIVE NEGATIVE Final    Comment: (NOTE) SARS-CoV-2 target nucleic acids are NOT DETECTED.  The SARS-CoV-2 RNA is generally detectable in upper and lower respiratory specimens during the acute phase of infection. The lowest concentration of SARS-CoV-2 viral copies this assay can detect is 250 copies / mL. A negative result does not preclude SARS-CoV-2 infection and should not be used as the sole basis for treatment or other patient management decisions.  A negative result may occur with improper specimen collection / handling, submission of specimen other than nasopharyngeal swab, presence of viral mutation(s) within the areas targeted by this assay, and inadequate number of viral copies (<250 copies / mL). A negative result must be combined with clinical observations, patient history, and epidemiological information.  Fact Sheet for Patients:   StrictlyIdeas.no  Fact Sheet for Healthcare Providers: BankingDealers.co.za  This test is not yet approved or  cleared by the [place] FDA and has been authorized for detection and/or diagnosis of SARS-CoV-2 by FDA under an Emergency Use Authorization (EUA).  This EUA will remain in effect (meaning this test can be used) for the duration of the COVID-19 declaration under Section 564(b)(1) of the Act, 21 U.S.C. section 360bbb-3(b)(1), unless the authorization is  terminated or revoked sooner.  Performed at [facility], [place] [address], [place], [place] [address]   MRSA PCR Screening     Status: Abnormal   Collection Time: 10/26/19  9:05 AM   Specimen: Nasopharyngeal  Result Value Ref Range Status   MRSA by PCR POSITIVE (A) NEGATIVE Final    Comment:        The GeneXpert MRSA Assay (FDA approved for NASAL specimens only), is one component of a comprehensive MRSA colonization surveillance program. It is not intended to diagnose MRSA infection nor to guide or monitor treatment for MRSA infections. RESULT CALLED TO, READ BACK BY AND VERIFIED WITH: Melony Overly RN 11:30 10/26/19 ([email or web address]) Performed at [facility], [place] [address], [place],  [address]      Medications:   . amoxicillin-clavulanate  1 tablet Oral Q12H  . Chlorhexidine Gluconate Cloth  6 each Topical Daily  . diltiazem  240 mg Oral Daily  . doxycycline  100 mg Oral Q12H  . enoxaparin (LOVENOX) injection  40 mg Subcutaneous Q24H  . escitalopram  10 mg Oral Daily  .  fentaNYL  1 patch Transdermal Q72H  . furosemide  40 mg Oral Daily  . melatonin  3 mg Oral QHS  . metoprolol tartrate  50 mg Oral BID  . mupirocin ointment  1 application Nasal BID  . polyethylene glycol  17 g Oral BID  . potassium chloride  10 mEq Oral Daily  . predniSONE  10 mg Oral Q breakfast  . saccharomyces boulardii  250 mg Oral BID  . senna-docusate  1 tablet Oral QHS   Continuous Infusions:     LOS: 4 days   Charlynne Cousins  [facility]  10/29/2019, 10:31 AM

## 2019-10-30 DIAGNOSIS — I2510 Atherosclerotic heart disease of native coronary artery without angina pectoris: Secondary | ICD-10-CM

## 2019-10-30 LAB — CULTURE, BLOOD (ROUTINE X 2)
Culture: NO GROWTH
Culture: NO GROWTH
Special Requests: ADEQUATE

## 2019-10-30 LAB — BASIC METABOLIC PANEL
Anion gap: 11 (ref 5–15)
BUN: 11 mg/dL (ref 8–23)
CO2: 29 mmol/L (ref 22–32)
Calcium: 8.6 mg/dL — ABNORMAL LOW (ref 8.9–10.3)
Chloride: 98 mmol/L (ref 98–111)
Creatinine, Ser: 0.64 mg/dL (ref 0.44–1.00)
GFR calc Af Amer: >60 mL/min (ref >60)
GFR calc non Af Amer: >60 mL/min (ref >60)
Glucose, Bld: 90 mg/dL (ref 70–99)
Potassium: 3.7 mmol/L (ref 3.5–5.1)
Sodium: 138 mmol/L (ref 135–145)

## 2019-10-30 MED ORDER — FUROSEMIDE 40 MG PO TABS: 40.0000 mg | ORAL_TABLET | Freq: Every day | ORAL | 0 refills | Status: DC

## 2019-10-30 MED ORDER — POTASSIUM CHLORIDE CRYS ER 10 MEQ PO TBCR
10.0000 meq | EXTENDED_RELEASE_TABLET | Freq: Every day | ORAL | 2 refills | Status: DC
Start: 2019-10-30 — End: 2019-12-01

## 2019-10-30 MED ORDER — DILTIAZEM HCL ER COATED BEADS 240 MG PO CP24: 240.0000 mg | ORAL_CAPSULE | Freq: Every day | ORAL | 3 refills | Status: DC

## 2019-10-30 MED ORDER — DOXYCYCLINE HYCLATE 100 MG PO TABS: 100.0000 mg | ORAL_TABLET | Freq: Two times a day (BID) | ORAL | 0 refills | Status: DC

## 2019-10-30 MED ORDER — HYDROCODONE-ACETAMINOPHEN 10-325 MG PO TABS: ORAL_TABLET | ORAL | 0 refills | Status: DC

## 2019-10-30 MED ORDER — ALPRAZOLAM 0.5 MG PO TABS: 0.5000 mg | ORAL_TABLET | Freq: Every evening | ORAL | 0 refills | Status: DC | PRN

## 2019-10-30 MED ORDER — FENTANYL 12 MCG/HR TD PT72: 1.0000 | MEDICATED_PATCH | TRANSDERMAL | 0 refills | Status: DC

## 2019-10-30 MED ORDER — ESCITALOPRAM OXALATE 10 MG PO TABS: 10.0000 mg | ORAL_TABLET | Freq: Every day | ORAL | 3 refills | Status: DC

## 2019-10-30 MED ORDER — METOPROLOL TARTRATE 50 MG PO TABS: 50.0000 mg | ORAL_TABLET | Freq: Two times a day (BID) | ORAL | 0 refills | Status: DC

## 2019-10-30 MED ORDER — PREDNISONE 10 MG PO TABS: 10.0000 mg | ORAL_TABLET | Freq: Every day | ORAL | 0 refills | Status: DC

## 2019-10-30 MED ORDER — AMOXICILLIN-POT CLAVULANATE 875-125 MG PO TABS: 1.0000 | ORAL_TABLET | Freq: Two times a day (BID) | ORAL | 0 refills | Status: DC

## 2019-10-30 MED FILL — predniSONE 10 MG TABS: 10 | 30 days supply | Qty: 30 | Fill #0

## 2019-10-30 MED FILL — CARTIA XT 240 MG CAPSULE: 240 | 90 days supply | Qty: 90 | Fill #0

## 2019-10-30 MED FILL — fentaNYL 12 MCG/HR PT72: 12 | 6 days supply | Qty: 2 | Fill #0

## 2019-10-30 MED FILL — FUROSEMIDE 40 MG TABLET: 40 | 30 days supply | Qty: 30 | Fill #0

## 2019-10-30 MED FILL — HYDROCODON-APAP 10-325: 10-325 | 7 days supply | Qty: 21 | Fill #0

## 2019-10-30 MED FILL — DOXYCYCLINE HYCLATE 100 MG: 100 | 3 days supply | Qty: 5 | Fill #0

## 2019-10-30 MED FILL — POTASSIUM CHL ER M10 TABLET: 10 | 30 days supply | Qty: 30 | Fill #0

## 2019-10-30 MED FILL — METOPROLOL TARTRATE 50 MG T: 50 | 15 days supply | Qty: 30 | Fill #0

## 2019-10-30 MED FILL — ALPRAZolam 0.5 MG TABS: 0.5 | 7 days supply | Qty: 7 | Fill #0

## 2019-10-30 MED FILL — AMOX-CLAV 875-125 MG TABLET: 875-125 | 3 days supply | Qty: 5 | Fill #0

## 2019-10-30 NOTE — Progress Notes (Signed)
Physical Therapy Treatment Patient Details Name: Betty Jackson MRN: [id number] DOB: [birth year] Today's Date: 10/30/2019    History of Present Illness 85 y.o. female presenting with fever, nausea, lethargy and poor p.o  intake x 48hr with possible sepsis 2/2 pneumonia and acute metabolic encephalopathy. Patient with 2 recent hospital admissions after a fall ~6/21 at home resulting in distal R femur fx and again in July after a fall at [facility] resulting in recurrent closed R distal femur fx s/p ORIF 10/17/19 with NWB precautions and unrestricted R knee ROM. Patient d/c to SNF on 7/26. PMHx significant for A-fib, Hx of falls, CVA, chronic diastolic CHF, giant cell arthritis, chronic pain syndrome, and diverticulosis.     PT Comments    Pt admitted with above diagnosis. Pt very fatigued today and very limited in what she could do today. She was able to get to EOB with max assist of 2.   Pt having difficulty sitting EOB as well as having difficulty with transfer and sit to stand.  Needed incr assist. Granddaughter present and states that family is hiring caregivers for pt ans he is going home with [facility] services.  They state a hospital bed is being delivered today.  Contacted CM that they would need a hoyer lift as well.  Updated frequency to 3x week since pt is going home and updated f/u and equipment below.  Pt currently with functional limitations due to balance and endurance deficits. Pt will benefit from skilled PT to increase their independence and safety with mobility to allow discharge to the venue listed below.     Follow Up Recommendations  Supervision/Assistance - 24 hour;Home health PT ([facility], [facility], [place])     Equipment Recommendations  Other (comment) Product manager lift)    Recommendations for Other Services       Precautions / Restrictions Precautions Precautions: Fall Restrictions Weight Bearing Restrictions: Yes RLE Weight Bearing: Non weight bearing Other Position/Activity Restrictions:  Unrestricted R knee ROM    Mobility  Bed Mobility Overal bed mobility: Needs Assistance Bed Mobility: Supine to Sit     Supine to sit: Max assist;+2 for physical assistance;HOB elevated     General bed mobility comments: Needed assist with bil LEs, right >left to come to EOB as well as incr assist for trunk elevation. Had [facility] raised quite high as well.  Used helicopter technique with pad with pt needeing assist to come to EOB..  Transfers Overall transfer level: Needs assistance Equipment used: 2 person hand held assist;Rolling walker (2 wheeled) Transfers: Sit to/from [employer] Pivot Transfers;Stand Pivot Transfers Sit to Stand: Mod assist;+2 physical assistance;From elevated surface;Max assist        Lateral/Scoot Transfers: +2 physical assistance;Total assist;Max assist;From elevated surface (to pts right side due to drop arm recliner only on left chai) General transfer comment: Pt needed mod to max assist of 2 to power up to RW but could not stand upright with flexed trunk and could not stand on left LE with NWB on right.  Noted BM therefore stood again wiht PT in front assisting pt and tech wiping pts bottom.  Pt not strong enough in UEs to pivot squatting to get to chair therefore dropped left armrest and scooted pt to the recliner with max to total assist.    Ambulation/Gait             General Gait Details: Unable at this time   Chief Strategy Officer  Modified Rankin (Stroke Patients Only)       Balance Overall balance assessment: Needs assistance Sitting-balance support: Bilateral upper extremity supported;Feet supported Sitting balance-[name]: Fair Sitting balance - Comments: can sit EOB with UE support.  LEans posteriorly and to left with fatigue.  Postural control: Left lateral lean;Posterior lean Standing balance support: Bilateral upper extremity supported;During functional activity Standing balance-[name]:  Poor Standing balance comment: relies on +2 mod to max assist for partial stand                            Cognition Arousal/Alertness: Awake/alert Behavior During Therapy: WFL for tasks assessed/performed Overall Cognitive Status: Impaired/Different from baseline Area of Impairment: Memory;Problem solving                 Orientation Level: Situation   Memory: Decreased short-term memory;Decreased recall of precautions       Problem Solving: Slow processing General Comments: Pt oriented today x 3.  Pt followed 1 step commands well.       Exercises Total Joint Exercises Ankle Circles/Pumps: AROM;Both;10 reps;Supine Quad Sets: AROM;Both;10 reps;Supine Gluteal Sets: AROM;Both;10 reps;Supine Heel Slides: AAROM;5 reps;Right;Supine Long Arc Quad: AROM;Right;10 reps;Seated    General Comments        Pertinent Vitals/Pain Pain Assessment: No/denies pain Faces Pain Scale: No hurt    Home Living                      Prior Function            PT Goals (current goals can now be found in the care plan section) Acute Rehab PT Goals Patient Stated Goal: to get stronger Progress towards PT goals: Not progressing toward goals - comment (Limited by pain and weakness)    Frequency    Min 3X/week      PT Plan Frequency needs to be updated;Discharge plan needs to be updated;Equipment recommendations need to be updated    Co-evaluation PT/OT/SLP Co-Evaluation/Treatment: Yes            AM-PAC PT "6 Clicks" Mobility   Outcome Measure  Help needed turning from your back to your side while in a flat bed without using bedrails?: Total Help needed moving from lying on your back to sitting on the side of a flat bed without using bedrails?: Total Help needed moving to and from a bed to a chair (including a wheelchair)?: Total Help needed standing up from a chair using your arms (e.g., wheelchair or bedside chair)?: Total Help needed to walk in  hospital room?: Total Help needed climbing 3-5 steps with a railing? : Total 6 Click Score: 6    End of Session Equipment Utilized During Treatment: Gait belt Activity Tolerance: Patient limited by fatigue Patient left: in chair;with call bell/phone within reach;with chair alarm set;with family/visitor present (granddaughter) Nurse Communication: Mobility status PT Visit Diagnosis: Other abnormalities of gait and mobility (R26.89);Pain Pain - Right/Left: Right Pain - part of body: Leg     Time: 1151-1227 PT Time Calculation (min) (ACUTE ONLY): 36 min  Charges:  $Therapeutic Exercise: 8-22 mins $Therapeutic Activity: 8-22 mins                     Jaren Vanetten W,PT Acute Rehabilitation Services Pager:  [phone]  Office:  [place] 10/30/2019, 2:11 PM

## 2019-10-30 NOTE — Progress Notes (Signed)
  Speech Language Pathology Treatment: Dysphagia  Patient Details Name: Betty Jackson MRN: [id number] DOB: 03-27-1935 Today's Date: 10/30/2019 Time: 7026-3785 SLP Time Calculation (min) (ACUTE ONLY): 9 min  Assessment / Plan / Recommendation Clinical Impression  Pt was seen for dysphagia treatment and was cooperative throughout the session. Pt and her daughter, Betty Jackson, reported that the pt has been tolerating the current diet without overt s/sx of aspiration. Pt tolerated regular texture solids and thin liquids via straw using consecutive swallows without symptoms of oropharyngeal dysphagia. It is recommended that the current diet be continued. Further skilled SLP services are not clinically indicated at this time.    HPI HPI: Pt is an 85 y.o. 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  She underwent ORIF on 7/20. She was discharged to skilled nursing facility on 7/26 and returned to the ED due to presence of fever. CXR 7/28: Slight worsening atelectasis or mild infiltrate at both lung bases. BSE 09/27/19: normal oropharyngeal swallow       SLP Plan  All goals met;Discharge SLP treatment due to (comment)       Recommendations  Diet recommendations: Regular;Thin liquid Liquids provided via: Straw;Cup Medication Administration: Whole meds with liquid Supervision: Patient able to self feed Compensations: Slow rate Postural Changes and/or Swallow Maneuvers: Seated upright 90 degrees                Oral Care Recommendations: Oral care BID Follow up Recommendations: Other (comment) (TBD) SLP Visit Diagnosis: Dysphagia, unspecified (R13.10) Plan: All goals met;Discharge SLP treatment due to (comment)       Mariyam Remington I. Hardin Negus, [place],  [place] Office number [phone] Pager [phone]                Horton Marshall 10/30/2019, 10:52 AM

## 2019-10-30 NOTE — TOC Initial Note (Addendum)
Transition of Care [facility]) - Initial/Assessment Note    Patient Details  Name: Betty Jackson MRN: [id number] Date of Birth: 01-Apr-1934  Transition [facility] CM/SW Contact:    Zenon Mayo, RN Phone Number: 10/30/2019, 9:54 AM  Clinical Narrative:                 NCM spoke with daughters , Terrence Dupont and Melody at bedside, they state they would like to go back [facility] with [facility] for outpatient palliative services.  They state they would like to have a hospital bed also at the home before patient is dc to home.  NCM made referral to [facility] with [facility] for hospital bed.  She will also need HHPT/HHOT.  NCM offered choice , Terrence Dupont states she has no preference, NCM made referral to [place] with [place].  He is able to take referral for [place], [place].  Soc will begin 24 to 48 hrs post dc. Patient will need ambulance transport at dc also address confirmed.    Expected Discharge Plan: [place] Barriers to Discharge:  (hospital bed ordered day of discharge)   Patient Goals and CMS Choice Patient states their goals for this hospitalization and ongoing recovery are:: home with [facility] and palliative services CMS Medicare.gov Compare Post Acute Care list provided to:: Patient Represenative (must comment) Choice offered to / list presented to : Adult Children  Expected Discharge Plan and Services Expected Discharge Plan: [place]   Discharge Planning Services: CM Consult Post Acute Care Choice: Home Health, Durable Medical Equipment Living arrangements for the past 2 months: Single Family Home Expected Discharge Date: 10/30/19               DME Arranged: Hospital bed DME Agency: [facility] Date DME Agency Contacted: 10/30/19 Time DME Agency Contacted: [phone] Representative spoke with at DME Agency: Thedore Mins HH Arranged: PT, OT [place] Agency: [place] Date [place]: 10/30/19 Time [place]: [phone] Representative  spoke with at [place] Arrangements/Services Living arrangements for the past 2 months: [place] with:: Adult Children Patient language and need for interpreter reviewed:: Yes Do you feel safe going back to the place where you live?: Yes   SNF  Need for Family Participation in Patient Care: Yes (Comment) Care giver support system in place?: Yes (comment)   Criminal Activity/Legal Involvement Pertinent to Current Situation/Hospitalization: No - Comment as needed  Activities of Daily Living Home Assistive Devices/Equipment: Environmental consultant (specify type), Wheelchair ADL Screening (condition at time of admission) Patient's cognitive ability adequate to safely complete daily activities?: No Is the patient deaf or have difficulty hearing?: No Does the patient have difficulty seeing, even when wearing glasses/contacts?: No Does the patient have difficulty concentrating, remembering, or making decisions?: Yes Patient able to express need for assistance with ADLs?: Yes Does the patient have difficulty dressing or bathing?: Yes Independently performs ADLs?: No Does the patient have difficulty walking or climbing stairs?: Yes Weakness of Legs: Both Weakness of Arms/Hands: Both  Permission Sought/Granted Permission sought to share information with : Case Manager, Family Supports, Customer service manager Permission granted to share information with : Yes, Verbal Permission Granted  Share Information with NAME: Melissa  Permission granted to share info w AGENCY: SNF  Permission granted to share info w Relationship: daughter  Permission granted to share info w Contact Information: [name] [phone]  Emotional Assessment Appearance:: Appears stated age Attitude/Demeanor/Rapport:  (  Appropriate) Affect (typically observed): Appropriate Orientation: : Oriented to Self, Oriented to Place, Oriented to  Time, Oriented to Situation Alcohol / Substance Use: Not  Applicable Psych Involvement: No (comment)  Admission diagnosis:  HCAP (healthcare-associated pneumonia) [J18.9] Pneumonia of left lower lobe due to infectious organism [J18.9] Sepsis with encephalopathy without septic shock, due to unspecified organism ([place]) [A41.9, R65.20, G93.40] Patient Active Problem List   Diagnosis Date Noted  . Palliative care by specialist   . Goals of care, counseling/discussion   . DNR (do not resuscitate)   . Weakness   . Advanced directives, counseling/discussion   . Pressure injury of skin 10/18/2019  . Closed fracture of right distal femur ([place]) 10/17/2019  . Femur fracture, right ([place]) 09/25/2019  . Chronic diastolic CHF (congestive heart failure) ([place]) 09/25/2019  . HCAP (healthcare-associated pneumonia) 06/19/2019  . Encephalopathy 06/19/2019  . Lumbar degenerative disc disease   . Paroxysmal A-fib ([place])   . Hypomagnesemia   . C. difficile colitis   . Diarrhea 05/29/2019  . CHF (congestive heart failure) ([place]) 05/29/2019  . Recurrent falls 04/30/2019  . Pericardial effusion without cardiac tamponade 03/29/2019  . Hypoalbuminemia 03/27/2019  . Cardiac tamponade   . Pericarditis 03/06/2019  . Fever   . Septic shock ([place])   . Atypical chest pain   . Atrial fibrillation with rapid ventricular response ([place])   . Nausea 01/17/2019  . Acute diverticulitis 01/13/2019  . Chronic back pain 01/13/2019  . Tachycardia 01/13/2019  . Hypokalemia 01/13/2019  . Chronic low back pain 07/10/2018  . Influenza A 05/20/2017  . Sepsis ([place]) 05/20/2017  . Acute respiratory failure with hypoxia ([place]) 05/20/2017  . CKD (chronic kidney disease), stage III 05/20/2017  . PAF (paroxysmal atrial fibrillation) ([place]) 05/20/2017  . HTN (hypertension) 05/20/2017  . Giant cell arteritis ([place]) 05/20/2017  . Coronary disease 05/20/2017  . Abnormal urinalysis 05/20/2017  . Osteoporosis 05/08/2016  . Herniated nucleus pulposus, L5-S1, right 11/04/2015  . Major depressive  disorder, recurrent episode, moderate ([place]) 07/16/2013  . Abdominal pain, epigastric 06/22/2013  . Lumbar stenosis 04/25/2013  . Chronic insomnia 02/07/2013  . Elevated transaminase level 02/07/2013  . CVA (cerebral infarction) 10/29/2012  . Visual changes 10/28/2012  . Depression 10/28/2012  . Anxiety state 10/28/2012  . Anemia, unspecified 10/28/2012  . Nonspecific (abnormal) findings on radiological and other examination of gastrointestinal tract 01/25/2012  . Hyponatremia 01/24/2012  . Hematuria 01/24/2012  . Enteritis 12/24/2011  . Abdominal pain, other specified site 12/24/2011  . Leg pain, right 02/18/2011  . Temporal arteritis ([place]) 09/01/2010  . Hypertension 09/01/2010  . Hyperlipidemia 09/01/2010  . History of atrial fibrillation 09/01/2010  . CAD (coronary artery disease) 09/01/2010  . GERD (gastroesophageal reflux disease) 09/01/2010  . Insomnia 09/01/2010   PCP:  Eulas Post, MD Pharmacy:   [id number] - [place], [place] [address] Phone: [phone] Fax: [phone]     Social Determinants of Health (SDOH) Interventions    Readmission Risk Interventions Readmission Risk Prevention Plan 10/30/2019 09/26/2019 06/21/2019  Transportation Screening Complete Complete Complete  Medication Review Press photographer) Complete Complete Complete  PCP or Specialist appointment within 3-5 days of discharge Complete Complete Not Complete  PCP/Specialist Appt Not Complete comments - - likely SNF placement  HRI or Home Care Consult Complete Complete Not Complete  HRI or Home Care Consult Pt Refusal Comments - - likely SNF  SW Recovery Care/Counseling Consult Complete Complete Complete  Palliative Care Screening Complete  Complete Complete  Skilled Nursing Facility Not Applicable Complete Complete  Some recent data might be hidden

## 2019-10-30 NOTE — Plan of Care (Signed)
  Problem: Safety: Goal: Ability to remain free from injury will improve Outcome: Progressing   

## 2019-10-30 NOTE — Discharge Summary (Addendum)
Physician Discharge Summary  Betty Jackson LPF:[id number] DOB: 05/24/1934 DOA: 10/25/2019  PCP: Eulas Post, MD  Admit date: 10/25/2019 Discharge date: 10/30/2019  Admitted From: SNF Disposition:  Home  Recommendations for Outpatient Follow-up:  1. She will go home with palliative care. 2. The family has arranged for primary care at home.  Home Health:Yes Equipment/Devices:None  Discharge Condition:Stable CODE STATUS:DNR  Diet recommendation: Heart healthy diet.  Brief/Interim Summary: 85 y.o. 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 difficile for which she was hospitalized twice.  Was been hospitalized twice in the last month for 2 different fractures of her right femur underwent ORIF in 10/17/2019 transfuse 1 unit of packed red blood cells and discharged to skilled nursing facility on 10/23/2019.  Comes back to the ED for fevers and being lethargic at skilled nursing facility.  Per report from the nursing home she has had poor oral intake over the last 48 hours in the ED she was found to have a temperature of 101  Discharge Diagnoses:  Principal Problem:   HCAP (healthcare-associated pneumonia) Active Problems:   Hyperlipidemia   CAD (coronary artery disease)   Severe sepsis (HCC)   CKD (chronic kidney disease), stage III   PAF (paroxysmal atrial fibrillation) (HCC)   HTN (hypertension)   Giant cell arteritis (HCC)   Chronic diastolic CHF (congestive heart failure) ([place])   Palliative care by specialist   Goals of care, counseling/discussion   DNR (do not resuscitate)   Weakness   Advanced directives, counseling/discussion  Severe Sepsis due to HCAP: She was started empirically on IV antibiotic SARS-CoV-2 PCR was negative speech  evaluated the patient and the recommended regular diet. She had a significant decline over the last several months we spoke with the family and they decided to move towards palliative care. They would like to take her home. To new her antibiotic regimen as an outpatient and she will be discharged home with palliative care.  Acute metabolic encephalopathy/acute confusional state: Multifactorial in the setting of infectious etiology and dehydration.  Acute kidney injury on chronic kidney disease stage III: With a baseline creatinine of less than 1 admission 1.3 she was fluid resuscitated and creatinine returned to baseline.  New right forearm cellulitis: She was started on doxycycline which should continue as an outpatient.  Normocytic anemia: No changes in hemoglobin no signs of bleeding.  Chronic diastolic heart failure: No changes made to her medication.  Giant cell arthritis: No changes made to her steroids.   Discharge Instructions  Discharge Instructions    Diet - low sodium heart healthy   Complete by: As directed    Increase activity slowly   Complete by: As directed    No wound care   Complete by: As directed      Allergies as of 10/30/2019      Reactions   Dextromethorphan Rash      Medication List    STOP taking these medications   azithromycin 500 MG tablet Commonly known as: ZITHROMAX     TAKE these medications   ALPRAZolam 0.5 MG tablet Commonly known as: XANAX Take 1 tablet (0.5 mg total) by mouth at bedtime as needed for sleep.   amoxicillin-clavulanate 875-125 MG tablet Commonly known as: AUGMENTIN Take 1 tablet by mouth every 12 (twelve) hours.  diltiazem 240 MG 24 hr capsule Commonly known as: CARDIZEM CD Take 1 capsule (240 mg total) by mouth daily.   doxycycline 100 MG tablet Commonly known as: VIBRA-TABS Take 1 tablet (100 mg total) by mouth every 12 (twelve) hours.   enoxaparin 30 MG/0.3ML injection Commonly known as:  LOVENOX Inject 0.3 mLs (30 mg total) into the skin daily.   escitalopram 10 MG tablet Commonly known as: LEXAPRO Take 1 tablet (10 mg total) by mouth daily.   famotidine 20 MG tablet Commonly known as: PEPCID Take 1 tablet (20 mg total) by mouth daily.   fentaNYL 12 MCG/HR Commonly known as: [place] 1 patch onto the skin every 3 (three) days.   ferrous sulfate 325 (65 FE) MG tablet Take 1 tablet (325 mg total) by mouth 2 (two) times daily with a meal.   furosemide 40 MG tablet Commonly known as: LASIX Take 1 tablet (40 mg total) by mouth daily.   HYDROcodone-acetaminophen 10-325 MG tablet Commonly known as: NORCO Take one tablet by mouth every 8 hours as needed for breakthrough pain.   LACTOBACILLUS PO Take 2 capsules by mouth 3 (three) times daily.   metoprolol tartrate 50 MG tablet Commonly known as: LOPRESSOR Take 1 tablet (50 mg total) by mouth 2 (two) times daily.   multivitamin with minerals Tabs tablet Take 1 tablet by mouth daily.   potassium chloride 10 MEQ tablet Commonly known as: KLOR-CON Take 1 tablet (10 mEq total) by mouth daily.   predniSONE 10 MG tablet Commonly known as: DELTASONE Take 1 tablet (10 mg total) by mouth daily with breakfast.   senna-docusate 8.6-50 MG tablet Commonly known as: Senokot-S Take 1 tablet by mouth at bedtime.   vitamin C 500 MG tablet Commonly known as: ASCORBIC ACID Take 1,000 mg by mouth daily.   Vitamin D3 25 MCG tablet Commonly known as: Vitamin D Take 2 tablets (2,000 Units total) by mouth 2 (two) times daily.   vitamin E 180 MG (400 UNITS) capsule Generic drug: vitamin E Take 400 Units by mouth daily.            Durable Medical Equipment  (From admission, onward)         Start     Ordered   10/30/19 1230  For home use only DME Other see comment  Once       Comments: HOYER LIFT  Question:  Length of Need  Answer:  Lifetime   10/30/19 1230   10/30/19 0916  For home use only DME Hospital  bed  Once       Question Answer Comment  Length of Need 6 Months   The above medical condition requires: Patient requires the ability to reposition frequently   Head must be elevated greater than: 45 degrees   Bed type Semi-electric      10/30/19 0915          Follow-up Information    [facility] Follow up.   Specialty: Home Health Services Why: HHPT, [place] Contact information: [address] [place] [address] [phone]        [facility], [facility] Follow up.   Why: hospital bed Contact information: [place] [address] [phone]              Allergies  Allergen Reactions  . Dextromethorphan Rash    Consultations:  None   Procedures/Studies: DG Forearm Right  Result Date: 10/27/2019 CLINICAL DATA:  Right forearm pain.  No known injury. EXAM: RIGHT FOREARM - 2 VIEW COMPARISON:  No recent. FINDINGS: IV catheter noted. Small benign-appearing soft tissue calcification noted over the posterior aspect of the upper extremity. Degenerative changes right wrist and elbow. No acute bony or joint abnormality identified. Peripheral vascular calcification. IMPRESSION: 1. Degenerative changes right wrist and elbow. No acute bony abnormality. 2.  Peripheral vascular disease. Electronically Signed   By: Marcello Moores  Register   On: 10/27/2019 13:35   CT Head Wo Contrast  Result Date: 10/17/2019 CLINICAL DATA:  Status post fall. EXAM: CT HEAD WITHOUT CONTRAST TECHNIQUE: Contiguous axial images were obtained from the base of the skull through the vertex without intravenous contrast. COMPARISON:  June 20, 2019 FINDINGS: Brain: There is mild cerebral atrophy with widening of the extra-axial spaces and ventricular dilatation. There are areas of decreased attenuation within the white matter tracts of the supratentorial brain, consistent with microvascular disease changes. Vascular: No hyperdense vessel or unexpected calcification. Skull:  Normal. Negative for fracture or focal lesion. Sinuses/Orbits: No acute finding. Other: None. IMPRESSION: 1. Generalized cerebral atrophy. 2. No acute intracranial abnormality. Electronically Signed   By: Virgina Norfolk M.D.   On: 10/17/2019 03:05   CT Cervical Spine Wo Contrast  Result Date: 10/17/2019 CLINICAL DATA:  Status post fall. EXAM: CT CERVICAL SPINE WITHOUT CONTRAST TECHNIQUE: Multidetector CT imaging of the cervical spine was performed without intravenous contrast. Multiplanar CT image reconstructions were also generated. COMPARISON:  None. FINDINGS: Alignment: There is approximately 1 mm anterolisthesis of the C3 vertebral body on C4. 2 mm to 3 mm anterolisthesis of the C4 vertebral body on C5 is noted. Skull base and vertebrae: No acute fracture. No primary bone lesion or focal pathologic process. Soft tissues and spinal canal: No prevertebral fluid or swelling. No visible canal hematoma. Disc levels: Moderate to marked severity endplate sclerosis is seen at the levels of C5-C6 and C6-C7. Moderate to marked severity intervertebral disc space narrowing is also seen at these levels. Marked severity posterior intervertebral disc space narrowing is seen at the level of C3-C4. Marked severity bilateral multilevel facet joint hypertrophy is noted. Upper chest: Negative. Other: None. IMPRESSION: 1. No acute fracture within the cervical spine. 2. Marked severity bilateral multilevel facet joint hypertrophy. 3. Moderate to marked severity degenerative changes at the levels of C5-C6 and C6-C7. 4. 2 mm to 3 mm anterolisthesis of the C3 vertebral body on C4 and C4 vertebral body on C5. Electronically Signed   By: Virgina Norfolk M.D.   On: 10/17/2019 03:07   DG Hand 2 View Right  Result Date: 10/28/2019 CLINICAL DATA:  RIGHT hand pain and swelling across second through fifth MCP joints. No known injury. EXAM: RIGHT HAND - 2 VIEW COMPARISON:  None. FINDINGS: No acute appearing osseous abnormality. Soft  tissues about the RIGHT hand are unremarkable. Degenerative osteoarthritic changes are seen throughout the IP and MCP joints, mild to moderate in degree. IMPRESSION: 1. No acute findings. 2. 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 Electronically Signed   By: Franki Cabot M.D.   On: 10/28/2019 13:00   DG Chest Port 1 View  Result Date: 10/25/2019 CLINICAL DATA:  Shortness of breath.  Fever.  Coronavirus exposure. EXAM: PORTABLE CHEST 1 VIEW COMPARISON:  10/17/2019 FINDINGS: Heart size remains normal. Aortic atherosclerosis as seen previously. Slight worsening atelectasis and or mild infiltrate at both lung bases. Upper lungs remain clear radiographically. No visible effusion. No acute bone finding.  IMPRESSION: Slight worsening atelectasis or mild infiltrate at both lung bases. Electronically Signed   By: Nelson Chimes M.D.   On: 10/25/2019 17:50   Chest Portable 1 View  Result Date: 10/17/2019 CLINICAL DATA:  Preoperative evaluation. EXAM: PORTABLE CHEST 1 VIEW COMPARISON:  September 25, 2019 FINDINGS: Mild, diffuse chronic appearing increased lung markings are seen without evidence of acute infiltrate, pleural effusion or pneumothorax. A radiopaque surgical suture is seen overlying the medial aspect of the left lung base. This is present on the prior exam. The cardiac silhouette is mildly enlarged and unchanged in size. There is evidence of prior vertebroplasty within the lower thoracic spine. IMPRESSION: Chronic appearing increased lung markings without evidence of acute or active cardiopulmonary disease. Electronically Signed   By: Virgina Norfolk M.D.   On: 10/17/2019 03:20   DG Knee Complete 4 Views Right  Result Date: 10/17/2019 CLINICAL DATA:  Pain EXAM: RIGHT KNEE - COMPLETE 4+ VIEW COMPARISON:  None. FINDINGS: There is an acute displaced and angulated fracture of the right femoral metadiaphysis. There is a large suprapatellar joint  effusion. The patient is status post prior plate screw fixation of the distal femur. There are 2 transcortical screws coursing through the femoral condyles which appear grossly intact. There are advanced degenerative changes of the knee. IMPRESSION: 1. Acute displaced and angulated fracture of the right femoral metadiaphysis. 2. Large suprapatellar joint effusion. 3. Advanced degenerative changes of the right knee. Electronically Signed   By: Constance Holster M.D.   On: 10/17/2019 02:13   DG Knee Right Port  Result Date: 10/17/2019 CLINICAL DATA:  Postop EXAM: PORTABLE RIGHT KNEE - 1-2 VIEW COMPARISON:  10/17/2019 FINDINGS: Incompletely visualized right femoral stem. Interval surgical plate and multiple screw fixation of the mid to distal femur across markedly comminuted fracture involving the distal femoral metaphysis and diaphysis. Previously noted medial plate and fixating screws have been removed. Residual 1/3 shaft diameter medial displacement of main distal fracture fragment and less than 1/4 shaft diameter posterior displacement of distal fracture fragment. Decreased angulation compared to previous. Gas in the soft tissues consistent with recent surgery. IMPRESSION: Interval surgical plate and screw fixation of markedly comminuted fracture involving the distal femoral metaphysis and diaphysis. Removal of prior medial fixating plate and screws from the distal femur. Electronically Signed   By: Donavan Foil M.D.   On: 10/17/2019 21:44   DG Swallowing Func-Speech Pathology  Result Date: 10/26/2019 Objective Swallowing Evaluation: Type of Study: MBS-Modified Barium Swallow Study  Patient Details Name: DALESHA STANBACK MRN: [id number] Date of Birth: [birth year] Today's Date: 10/26/2019 Time: SLP Start Time (ACUTE ONLY): 1340 -SLP Stop Time (ACUTE ONLY): 1355 SLP Time Calculation (min) (ACUTE ONLY): 15 min Past Medical History: Past Medical History: Diagnosis Date . Acute respiratory failure with hypoxia ([place])  05/20/2017 . Anemia, unspecified 10/28/2012 . Anxiety state, unspecified 10/28/2012 . CAD (coronary artery disease)   a. Stent RCA in [facility];  b. Cath approx 2009 - nonobs per pt report. . Cataract   immature on the left eye . Chronic insomnia 02/07/2013 . Chronic lower back pain   scoliosis . CKD (chronic kidney disease) stage 3, GFR 30-59 ml/min  . CVA (cerebral infarction) 10/29/2012 . DDD (degenerative disc disease)  . Depression  . Diverticulosis  . GERD (gastroesophageal reflux disease) 09/01/2010 . Hemorrhoids  . Herniated nucleus pulposus, L5-S1, right 11/04/2015 . Hyperlipidemia   takes Lipitor daily . Hypertension   takes Amlodipine,Losartan,Metoprolol,and HCTZ daily . Incontinence of urine  .  Insomnia   takes Restoril nightly . Lumbar stenosis 04/25/2013 . Major depressive disorder, recurrent episode, moderate ([place]) 07/16/2013 . Osteoporosis  . PAF (paroxysmal atrial fibrillation) ([place]) 2011  a. lone epidode in 2011 according to notes. . Rheumatoid arthritis ([place])  . Scoliosis  . Sepsis ([place]) 05/2019 . Temporal arteritis ([place]) 2011  a. followed @ [facility]; potential flareup 10/28/2012/notes 10/28/2012 . Vocal cord dysfunction   "they don't operate properly" (10/28/2012) Past Surgical History: Past Surgical History: Procedure Laterality Date . ABDOMINAL HYSTERECTOMY  ~ 1984  vaginally . BACK SURGERY  7-64yrs ago  X Stop . BLADDER SUSPENSION  2001 . BREAST BIOPSY Right  . CATARACT EXTRACTION W/ INTRAOCULAR LENS IMPLANT Right ~ 08/2012 . CHEST TUBE INSERTION Left 03/08/2019  Procedure: Chest Tube Insertion;  Surgeon: Ivin Poot, MD;  Location: [place];  Service: Thoracic;  Laterality: Left; . COLONOSCOPY  01/26/2012  Procedure: COLONOSCOPY;  Surgeon: Ladene Artist, MD,FACG;  Location: [facility] ENDOSCOPY;  Service: Endoscopy;  Laterality: N/A;  note the EGD is possible . CORONARY ANGIOPLASTY WITH STENT PLACEMENT  2006  X 1 stent . EPIDURAL BLOCK INJECTION   . ESOPHAGOGASTRODUODENOSCOPY  01/26/2012  Procedure:  ESOPHAGOGASTRODUODENOSCOPY (EGD);  Surgeon: Ladene Artist, MD,FACG;  [facility];  Service: Endoscopy;  Laterality: N/A; . FINE NEEDLE ASPIRATION Right 09/26/2019  Procedure: FINE NEEDLE ASPIRATION;  Surgeon: Altamese [place], MD;  Location: [place];  Service: Orthopedics;  Laterality: Right; . HEMIARTHROPLASTY HIP Right 2012 . IR EPIDUROGRAPHY  04/20/2019 . LAPAROSCOPIC CHOLECYSTECTOMY  2001 . LUMBAR FUSION  03/2013 . LUMBAR LAMINECTOMY/DECOMPRESSION MICRODISCECTOMY Right 11/04/2015  Procedure: Right Lumbar Five-Sacral One Microdiskectomy;  Surgeon: Kristeen Miss, MD;  Location: [place] NEURO ORS;  Service: Neurosurgery;  Laterality: Right;  Right L5-S1 Microdiskectomy . moles removed that required stiches    one on leg and one on face . ORIF FEMUR FRACTURE Right 09/26/2019  Procedure: OPEN REDUCTION INTERNAL FIXATION (ORIF) DISTAL FEMUR FRACTURE;  Surgeon: Altamese Romoland, MD;  Location: [place];  Service: Orthopedics;  Laterality: Right; . ORIF FEMUR FRACTURE Right 10/17/2019  Procedure: OPEN REDUCTION INTERNAL FIXATION (ORIF) DISTAL FEMUR FRACTURE;  Surgeon: Altamese [place], MD;  Location: [place];  Service: Orthopedics;  Laterality: Right; . SUBXYPHOID PERICARDIAL WINDOW N/A 03/08/2019  Procedure: SUBXYPHOID PERICARDIAL WINDOW;  Surgeon: Ivin Poot, MD;  Location: [place];  Service: Thoracic;  Laterality: N/A; . TEE WITHOUT CARDIOVERSION N/A 03/08/2019  Procedure: TRANSESOPHAGEAL ECHOCARDIOGRAM (TEE);  Surgeon: Prescott Gum, Collier [place], MD;  Location: [place];  Service: Thoracic;  Laterality: N/A; . TEMPORAL ARTERY BIOPSY / LIGATION Bilateral 2011 . TONSILLECTOMY AND ADENOIDECTOMY    at age 20 . TUBAL LIGATION  ~ 1982 . X-STOP IMPLANTATION  ~ 2010  "lower back" (10/28/2012) HPI: Pt is an 85 y.o. 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  She underwent ORIF on 7/20. She was discharged to skilled nursing facility on 7/26 and returned to the ED due to presence of fever. CXR 7/28: Slight worsening atelectasis or mild infiltrate at both lung bases. BSE 09/27/19: normal oropharyngeal swallow  No data recorded Assessment / Plan / Recommendation CHL IP CLINICAL IMPRESSIONS 10/26/2019 Clinical Impression Pt was seen in radiology suite for modified barium swallow study. Trials of puree solids, mixed consistency boluses (mechanical soft solids with thin liquids),  regular texture solids, a 13 mm barium tablet, and thin liquids via cup and straw were administered. Pt's oropharyngeal swallow mechanism was within normal limits. Transient penetration (PAS 2) was noted with consecutive swallows of thin liquids, but this is considered to be within normal limits and no instances of aspiration were demonstrated. Considering pt's presentation at bedside, with coughing following consecutive swallows, SLP anticipates that depth of laryngeal invasion likely increases with sub-optimal positioning. It is recommended that a regular texture diet with thin liquids be initiated with observance of swallowing precautions. SLP will follow briefly to ensure tolerance of the diet and observance of precautions. However, further skilled SLP services will likely not be needed beyond that.  SLP Visit Diagnosis Dysphagia, unspecified (R13.10) Attention and concentration deficit following -- Frontal lobe and executive function deficit following -- Impact on safety and function No limitations   CHL IP TREATMENT RECOMMENDATION 10/26/2019 Treatment Recommendations Therapy as outlined in treatment plan below   Prognosis 10/26/2019 Prognosis for Safe Diet Advancement Good Barriers to Reach Goals -- Barriers/Prognosis Comment -- CHL IP DIET RECOMMENDATION 10/26/2019 SLP Diet Recommendations Regular solids;Thin liquid Liquid  Administration via Cup;Straw Medication Administration Whole meds with liquid Compensations Slow rate Postural Changes Seated upright at 90 degrees   CHL IP OTHER RECOMMENDATIONS 10/26/2019 Recommended Consults -- Oral Care Recommendations Oral care BID Other Recommendations --   CHL IP FOLLOW UP RECOMMENDATIONS 10/26/2019 Follow up Recommendations Other (comment)   CHL IP FREQUENCY AND DURATION 10/26/2019 Speech Therapy Frequency (ACUTE ONLY) min 2x/week Treatment Duration 1 week      CHL IP ORAL PHASE 10/26/2019 Oral Phase WFL Oral - Pudding Teaspoon -- Oral - Pudding Cup -- Oral - Honey Teaspoon -- Oral - Honey Cup -- Oral - Nectar Teaspoon -- Oral - Nectar Cup -- Oral - Nectar Straw -- Oral - Thin Teaspoon -- Oral - Thin Cup -- Oral - Thin Straw -- Oral - Puree -- Oral - Mech Soft -- Oral - Regular -- Oral - Multi-Consistency -- Oral - Pill -- Oral Phase - Comment --  CHL IP PHARYNGEAL PHASE 10/26/2019 Pharyngeal Phase WFL Pharyngeal- Pudding Teaspoon -- Pharyngeal -- Pharyngeal- Pudding Cup -- Pharyngeal -- Pharyngeal- Honey Teaspoon -- Pharyngeal -- Pharyngeal- Honey Cup -- Pharyngeal -- Pharyngeal- Nectar Teaspoon -- Pharyngeal -- Pharyngeal- Nectar Cup -- Pharyngeal -- Pharyngeal- Nectar Straw -- Pharyngeal -- Pharyngeal- Thin Teaspoon -- Pharyngeal -- Pharyngeal- Thin Cup -- Pharyngeal -- Pharyngeal- Thin Straw -- Pharyngeal -- Pharyngeal- Puree -- Pharyngeal -- Pharyngeal- Mechanical Soft -- Pharyngeal -- Pharyngeal- Regular -- Pharyngeal -- Pharyngeal- Multi-consistency -- Pharyngeal -- Pharyngeal- Pill -- Pharyngeal -- Pharyngeal Comment --  CHL IP CERVICAL ESOPHAGEAL PHASE 10/26/2019 Cervical Esophageal Phase WFL Pudding Teaspoon -- Pudding Cup -- Honey Teaspoon -- Honey Cup -- Nectar Teaspoon -- Nectar Cup -- Nectar Straw -- Thin Teaspoon -- Thin Cup -- Thin Straw -- Puree -- Mechanical Soft -- Regular -- Multi-consistency -- Pill -- Cervical Esophageal Comment -- Shanika I. Hardin Negus, [place], [place] Office number [phone] Pager [place] 10/26/2019, 5:07 PM              DG C-Arm 1-60 Min  Result Date: 10/17/2019 CLINICAL DATA:  ORIF right femur EXAM: RIGHT FEMUR 2 VIEWS; DG C-ARM 1-60 MIN COMPARISON:  10/17/2019 FINDINGS: Previous right hip replacement. 6 low resolution intraoperative spot views of the right femur. Total fluoroscopy time was 45 seconds. Removal of previously noted distal plate and fixating screws from the femur. Placement  of long surgical plate and multiple fixating screws across comminuted fracture involving the distal thumb oral diaphysis and metaphysis. IMPRESSION: Intraoperative fluoroscopic assistance provided during surgical fixation of distal right femur fracture. Electronically Signed   By: Donavan Foil M.D.   On: 10/17/2019 19:31   DG Hip Unilat W or Wo Pelvis 2-3 Views Left  Result Date: 10/17/2019 CLINICAL DATA:  Pain EXAM: DG HIP (WITH OR WITHOUT PELVIS) 2-3V LEFT COMPARISON:  Aug 11, 2019 FINDINGS: The patient is status post total hip arthroplasty on the right. There is some mild, relatively stable lucency about the acetabular cup. There are healing fractures of the left inferior pubic ramus. There are degenerative changes of the left hip. There is osteopenia which limits detection of nondisplaced fractures. IMPRESSION: 1. No acute displaced fracture or dislocation. 2. Healing fracture of the left inferior pubic ramus. 3. Status post total hip arthroplasty on the right. 4. Osteopenia which limits detection of nondisplaced fractures. Electronically Signed   By: Constance Holster M.D.   On: 10/17/2019 02:11   DG Hip Unilat W or Wo Pelvis 2-3 Views Right  Result Date: 10/17/2019 CLINICAL DATA:  Hip pain EXAM: DG HIP (WITH OR WITHOUT PELVIS) 2-3V RIGHT COMPARISON:  May 2021 FINDINGS: The patient is status post total hip arthroplasty on the right. The hardware appears intact. There is some lucency about the acetabular cup  which may indicate underlying loosening. There is no acute displaced fracture. IMPRESSION: No acute abnormality. Electronically Signed   By: Constance Holster M.D.   On: 10/17/2019 02:12   DG FEMUR, MIN 2 VIEWS RIGHT  Result Date: 10/17/2019 CLINICAL DATA:  ORIF right femur EXAM: RIGHT FEMUR 2 VIEWS; DG C-ARM 1-60 MIN COMPARISON:  10/17/2019 FINDINGS: Previous right hip replacement. 6 low resolution intraoperative spot views of the right femur. Total fluoroscopy time was 45 seconds. Removal of previously noted distal plate and fixating screws from the femur. Placement of long surgical plate and multiple fixating screws across comminuted fracture involving the distal thumb oral diaphysis and metaphysis. IMPRESSION: Intraoperative fluoroscopic assistance provided during surgical fixation of distal right femur fracture. Electronically Signed   By: Donavan Foil M.D.   On: 10/17/2019 19:31   DG Femur Min 2 Views Right  Result Date: 10/17/2019 CLINICAL DATA:  Pain EXAM: RIGHT FEMUR 2 VIEWS COMPARISON:  None. FINDINGS: The patient has undergone prior plate and screw fixation of the distal femur at the level of the metadiaphysis. At this level, there is a new acute displaced fracture with significant associated angulation. There are 2 transcortical screws through the femoral condyles that appear intact. There are degenerative changes of the knee. There is a large joint effusion. Vascular calcifications are noted. There is soft tissue swelling. IMPRESSION: 1. Acute displaced fracture of the distal femur at the level of the patient's prior plate and screw fixation. 2. Large joint effusion. 3. Advanced degenerative changes of the right knee. Electronically Signed   By: Constance Holster M.D.   On: 10/17/2019 02:08    Subjective: No new complaints.  Discharge Exam: Vitals:   10/30/19 0804 10/30/19 1120  BP: (!) 155/75 (!) 148/66  Pulse: 95 82  Resp:  16  Temp:  99.1 F (37.3 C)  SpO2: 97% 93%    Vitals:   10/30/19 0454 10/30/19 0455 10/30/19 0804 10/30/19 1120  BP: (!) 178/84 (!) 178/84 (!) 155/75 (!) 148/66  Pulse: 79 79 95 82  Resp: 16 16  16   Temp: 98.8 F (37.1 C) 98.8 F (  37.1 C)  99.1 F (37.3 C)  TempSrc: Oral Oral  Oral  SpO2: 97% 97% 97% 93%  Weight:      Height:        General: Pt is alert, awake, not in acute distress Cardiovascular: RRR, S1/S2 +, no rubs, no gallops Respiratory: CTA bilaterally, no wheezing, no rhonchi Abdominal: Soft, NT, ND, bowel sounds + Extremities: no edema, no cyanosis    The results of significant diagnostics from this hospitalization (including imaging, microbiology, ancillary and laboratory) are listed below for reference.     Microbiology: Recent Results (from the past 240 hour(s))  SARS CORONAVIRUS 2 (TAT 6-24 HRS) Nasopharyngeal Nasopharyngeal Swab     Status: None   Collection Time: 10/23/19  3:42 AM   Specimen: Nasopharyngeal Swab  Result Value Ref Range Status   SARS Coronavirus 2 NEGATIVE NEGATIVE Final    Comment: (NOTE) SARS-CoV-2 target nucleic acids are NOT DETECTED.  The SARS-CoV-2 RNA is generally detectable in upper and lower respiratory specimens during the acute phase of infection. Negative results do not preclude SARS-CoV-2 infection, do not rule out co-infections with other pathogens, and should not be used as the sole basis for treatment or other patient management decisions. Negative results must be combined with clinical observations, patient history, and epidemiological information. The expected result is Negative.  Fact Sheet for Patients: SugarRoll.be  Fact Sheet for Healthcare Providers: https://www.woods-mathews.com/  This test is not yet approved or cleared by the [place] FDA and  has been authorized for detection and/or diagnosis of SARS-CoV-2 by FDA under an Emergency Use Authorization (EUA). This EUA will remain  in effect (meaning this  test can be used) for the duration of the COVID-19 declaration under Se ction 564(b)(1) of the Act, 21 U.S.C. section 360bbb-3(b)(1), unless the authorization is terminated or revoked sooner.  Performed at [facility], [place] [address], [place], [place] [address]   Urine culture     Status: None   Collection Time: 10/25/19  5:16 PM   Specimen: In/Out Cath Urine  Result Value Ref Range Status   Specimen Description IN/OUT CATH URINE  Final   Special Requests NONE  Final   Culture   Final    NO GROWTH Performed at [facility], [place] [address] [place], [place] [address]    Report Status 10/26/2019 FINAL  Final  Blood Culture (routine x 2)     Status: None   Collection Time: 10/25/19  5:45 PM   Specimen: BLOOD  Result Value Ref Range Status   Specimen Description BLOOD RIGHT ANTECUBITAL  Final   Special Requests   Final    BOTTLES DRAWN AEROBIC AND ANAEROBIC Blood Culture adequate volume   Culture   Final    NO GROWTH 5 DAYS Performed at [facility], [place] [address] [place], [place] [address]    Report Status 10/30/2019 FINAL  Final  Blood Culture (routine x 2)     Status: None   Collection Time: 10/25/19  5:45 PM   Specimen: BLOOD RIGHT WRIST  Result Value Ref Range Status   Specimen Description BLOOD RIGHT WRIST  Final   Special Requests   Final    BOTTLES DRAWN AEROBIC ONLY Blood Culture results may not be optimal due to an inadequate volume of blood received in culture bottles   Culture   Final    NO GROWTH 5 DAYS Performed at [facility], [place] [address] [place], [place] [address]  Report Status 10/30/2019 FINAL  Final  SARS Coronavirus 2 by RT PCR (hospital order, performed in [facility] hospital lab) Nasopharyngeal Nasopharyngeal Swab     Status: None   Collection Time: 10/25/19  8:31 PM   Specimen: Nasopharyngeal Swab  Result Value Ref Range Status   SARS Coronavirus 2 NEGATIVE NEGATIVE Final    Comment: (NOTE) SARS-CoV-2 target  nucleic acids are NOT DETECTED.  The SARS-CoV-2 RNA is generally detectable in upper and lower respiratory specimens during the acute phase of infection. The lowest concentration of SARS-CoV-2 viral copies this assay can detect is 250 copies / mL. A negative result does not preclude SARS-CoV-2 infection and should not be used as the sole basis for treatment or other patient management decisions.  A negative result may occur with improper specimen collection / handling, submission of specimen other than nasopharyngeal swab, presence of viral mutation(s) within the areas targeted by this assay, and inadequate number of viral copies (<250 copies / mL). A negative result must be combined with clinical observations, patient history, and epidemiological information.  Fact Sheet for Patients:   StrictlyIdeas.no  Fact Sheet for Healthcare Providers: BankingDealers.co.za  This test is not yet approved or  cleared by the [place] FDA and has been authorized for detection and/or diagnosis of SARS-CoV-2 by FDA under an Emergency Use Authorization (EUA).  This EUA will remain in effect (meaning this test can be used) for the duration of the COVID-19 declaration under Section 564(b)(1) of the Act, 21 U.S.C. section 360bbb-3(b)(1), unless the authorization is terminated or revoked sooner.  Performed at [facility], [place] [address], [place], [place] [address]   MRSA PCR Screening     Status: Abnormal   Collection Time: 10/26/19  9:05 AM   Specimen: Nasopharyngeal  Result Value Ref Range Status   MRSA by PCR POSITIVE (A) NEGATIVE Final    Comment:        The GeneXpert MRSA Assay (FDA approved for NASAL specimens only), is one component of a comprehensive MRSA colonization surveillance program. It is not intended to diagnose MRSA infection nor to guide or monitor treatment for MRSA infections. RESULT CALLED TO, READ BACK BY AND  VERIFIED WITH: Melony Overly RN 11:30 10/26/19 ([email or web address]) Performed at [facility], [place] [address], [place], [place] [address]      Labs: BNP (last 3 results) Recent Labs    05/30/19 0404 05/31/19 0455 06/01/19 0322  BNP 286.1* 506.7* 902.4*   Basic Metabolic Panel: Recent Labs  Lab 10/25/19 1745 10/26/19 0432 10/27/19 1058 10/29/19 0614 10/30/19 0450  NA 135 138 138 136 138  K 3.6 3.9 3.0* 4.9 3.7  CL 97* 103 106 103 98  CO2 26 23 22 25 29   GLUCOSE 109* 121* 142* 89 90  BUN 13 10 10 10 11   CREATININE 1.23* 0.85 0.72 0.65 0.64  CALCIUM 8.6* 8.1* 8.4* 8.8* 8.6*   Liver Function Tests: Recent Labs  Lab 10/25/19 1745 10/26/19 0432  AST 22 17  ALT 19 16  ALKPHOS 146* 121  BILITOT 2.4* 1.6*  PROT 6.2* 5.1*  ALBUMIN 2.6* 2.1*   No results for input(s): LIPASE, AMYLASE in the last 168 hours. No results for input(s): AMMONIA in the last 168 hours. CBC: Recent Labs  Lab 10/25/19 1745 10/26/19 0432 10/27/19 1058  WBC 15.8* 15.2* 13.1*  NEUTROABS 12.1*  --  11.2*  HGB 11.3* 9.9* 9.0*  HCT 35.6* 30.1* 28.2*  MCV 93.4 91.8 92.2  PLT 338  286 346   Cardiac Enzymes: No results for input(s): CKTOTAL, CKMB, CKMBINDEX, TROPONINI in the last 168 hours. BNP: Invalid input(s): POCBNP CBG: No results for input(s): GLUCAP in the last 168 hours. D-Dimer No results for input(s): DDIMER in the last 72 hours. Hgb A1c No results for input(s): HGBA1C in the last 72 hours. Lipid Profile No results for input(s): CHOL, HDL, LDLCALC, TRIG, CHOLHDL, LDLDIRECT in the last 72 hours. Thyroid function studies No results for input(s): TSH, T4TOTAL, T3FREE, THYROIDAB in the last 72 hours.  Invalid input(s): FREET3 Anemia work up No results for input(s): VITAMINB12, FOLATE, FERRITIN, TIBC, IRON, RETICCTPCT in the last 72 hours. Urinalysis    Component Value Date/Time   COLORURINE AMBER (A) 10/25/2019 1716   APPEARANCEUR HAZY (A) 10/25/2019 1716   LABSPEC 1.018 10/25/2019 1716    PHURINE 5.0 10/25/2019 1716   GLUCOSEU NEGATIVE 10/25/2019 1716   GLUCOSEU NEGATIVE 07/31/2016 1018   HGBUR MODERATE (A) 10/25/2019 1716   BILIRUBINUR NEGATIVE 10/25/2019 1716   BILIRUBINUR neg 07/31/2016 1012   KETONESUR NEGATIVE 10/25/2019 1716   PROTEINUR 30 (A) 10/25/2019 1716   UROBILINOGEN 0.2 07/31/2016 1018   UROBILINOGEN 0.2 07/31/2016 1012   NITRITE NEGATIVE 10/25/2019 1716   LEUKOCYTESUR NEGATIVE 10/25/2019 1716   Sepsis Labs Invalid input(s): PROCALCITONIN,  WBC,  LACTICIDVEN Microbiology Recent Results (from the past 240 hour(s))  SARS CORONAVIRUS 2 (TAT 6-24 HRS) Nasopharyngeal Nasopharyngeal Swab     Status: None   Collection Time: 10/23/19  3:42 AM   Specimen: Nasopharyngeal Swab  Result Value Ref Range Status   SARS Coronavirus 2 NEGATIVE NEGATIVE Final    Comment: (NOTE) SARS-CoV-2 target nucleic acids are NOT DETECTED.  The SARS-CoV-2 RNA is generally detectable in upper and lower respiratory specimens during the acute phase of infection. Negative results do not preclude SARS-CoV-2 infection, do not rule out co-infections with other pathogens, and should not be used as the sole basis for treatment or other patient management decisions. Negative results must be combined with clinical observations, patient history, and epidemiological information. The expected result is Negative.  Fact Sheet for Patients: SugarRoll.be  Fact Sheet for Healthcare Providers: https://www.woods-mathews.com/  This test is not yet approved or cleared by the [place] FDA and  has been authorized for detection and/or diagnosis of SARS-CoV-2 by FDA under an Emergency Use Authorization (EUA). This EUA will remain  in effect (meaning this test can be used) for the duration of the COVID-19 declaration under Se ction 564(b)(1) of the Act, 21 U.S.C. section 360bbb-3(b)(1), unless the authorization is terminated or revoked  sooner.  Performed at [facility], [place] [address], [place], [place] [address]   Urine culture     Status: None   Collection Time: 10/25/19  5:16 PM   Specimen: In/Out Cath Urine  Result Value Ref Range Status   Specimen Description IN/OUT CATH URINE  Final   Special Requests NONE  Final   Culture   Final    NO GROWTH Performed at [facility], [place] [address] [place], [place] [address]    Report Status 10/26/2019 FINAL  Final  Blood Culture (routine x 2)     Status: None   Collection Time: 10/25/19  5:45 PM   Specimen: BLOOD  Result Value Ref Range Status   Specimen Description BLOOD RIGHT ANTECUBITAL  Final   Special Requests   Final    BOTTLES DRAWN AEROBIC AND ANAEROBIC Blood Culture adequate volume   Culture   Final  NO GROWTH 5 DAYS Performed at [facility], [place] [address] [place], [place] [address]    Report Status 10/30/2019 FINAL  Final  Blood Culture (routine x 2)     Status: None   Collection Time: 10/25/19  5:45 PM   Specimen: BLOOD RIGHT WRIST  Result Value Ref Range Status   Specimen Description BLOOD RIGHT WRIST  Final   Special Requests   Final    BOTTLES DRAWN AEROBIC ONLY Blood Culture results may not be optimal due to an inadequate volume of blood received in culture bottles   Culture   Final    NO GROWTH 5 DAYS Performed at [facility], [place] [address] [place], [place] [address]    Report Status 10/30/2019 FINAL  Final  SARS Coronavirus 2 by RT PCR (hospital order, performed in [facility] hospital lab) Nasopharyngeal Nasopharyngeal Swab     Status: None   Collection Time: 10/25/19  8:31 PM   Specimen: Nasopharyngeal Swab  Result Value Ref Range Status   SARS Coronavirus 2 NEGATIVE NEGATIVE Final    Comment: (NOTE) SARS-CoV-2 target nucleic acids are NOT DETECTED.  The SARS-CoV-2 RNA is generally detectable in upper and lower respiratory specimens during the acute phase of infection. The lowest concentration of  SARS-CoV-2 viral copies this assay can detect is 250 copies / mL. A negative result does not preclude SARS-CoV-2 infection and should not be used as the sole basis for treatment or other patient management decisions.  A negative result may occur with improper specimen collection / handling, submission of specimen other than nasopharyngeal swab, presence of viral mutation(s) within the areas targeted by this assay, and inadequate number of viral copies (<250 copies / mL). A negative result must be combined with clinical observations, patient history, and epidemiological information.  Fact Sheet for Patients:   StrictlyIdeas.no  Fact Sheet for Healthcare Providers: BankingDealers.co.za  This test is not yet approved or  cleared by the [place] FDA and has been authorized for detection and/or diagnosis of SARS-CoV-2 by FDA under an Emergency Use Authorization (EUA).  This EUA will remain in effect (meaning this test can be used) for the duration of the COVID-19 declaration under Section 564(b)(1) of the Act, 21 U.S.C. section 360bbb-3(b)(1), unless the authorization is terminated or revoked sooner.  Performed at [facility], [place] [address] [place], [place] [address]   MRSA PCR Screening     Status: Abnormal   Collection Time: 10/26/19  9:05 AM   Specimen: Nasopharyngeal  Result Value Ref Range Status   MRSA by PCR POSITIVE (A) NEGATIVE Final    Comment:        The GeneXpert MRSA Assay (FDA approved for NASAL specimens only), is one component of a comprehensive MRSA colonization surveillance program. It is not intended to diagnose MRSA infection nor to guide or monitor treatment for MRSA infections. RESULT CALLED TO, READ BACK BY AND VERIFIED WITH: Melony Overly RN 11:30 10/26/19 ([email or web address]) Performed at [facility], [place] [address], [place], [place] [address]      Time coordinating discharge: Over 40  minutes  SIGNED:   Charlynne Cousins, MD  [facility] 10/30/2019, 4:05 PM Pager   If 7PM-7AM, please contact night-coverage [email or web address] Password [email or web address]

## 2019-10-31 ENCOUNTER — Telehealth: Payer: Self-pay | Admitting: Family Medicine

## 2019-10-31 NOTE — Telephone Encounter (Signed)
Former patient of [facility]- pt was discharged in June.  Pt is now back home- daughter is wanting [facility] to come back out to the home.  Antonieta [phone]

## 2019-10-31 NOTE — Telephone Encounter (Signed)
ok 

## 2019-10-31 NOTE — Telephone Encounter (Signed)
Please advise if okay?

## 2019-11-01 ENCOUNTER — Telehealth: Payer: Self-pay | Admitting: Family Medicine

## 2019-11-01 ENCOUNTER — Telehealth: Payer: Self-pay | Admitting: Cardiovascular Disease

## 2019-11-01 DIAGNOSIS — A419 Sepsis, unspecified organism: Secondary | ICD-10-CM | POA: Diagnosis not present

## 2019-11-01 DIAGNOSIS — J440 Chronic obstructive pulmonary disease with acute lower respiratory infection: Secondary | ICD-10-CM | POA: Diagnosis not present

## 2019-11-01 DIAGNOSIS — S72001D Fracture of unspecified part of neck of right femur, subsequent encounter for closed fracture with routine healing: Secondary | ICD-10-CM | POA: Diagnosis not present

## 2019-11-01 DIAGNOSIS — M9701XD Periprosthetic fracture around internal prosthetic right hip joint, subsequent encounter: Secondary | ICD-10-CM | POA: Diagnosis not present

## 2019-11-01 MED ORDER — FENTANYL 12 MCG/HR TD PT72: 1.0000 | MEDICATED_PATCH | TRANSDERMAL | 0 refills | Status: DC

## 2019-11-01 NOTE — Telephone Encounter (Signed)
Please advise 

## 2019-11-01 NOTE — Telephone Encounter (Signed)
Hey, Can you touch base with her or her daughter and see if she was told to stop Eliquis? She was taking it last time she was seen in our office. Thanks, chris

## 2019-11-01 NOTE — Telephone Encounter (Signed)
Mailbox full- Unable to leave a message

## 2019-11-01 NOTE — Telephone Encounter (Signed)
Left detailed message okaying order for pt to go back

## 2019-11-01 NOTE — Telephone Encounter (Signed)
Betty Jackson, states that on patient's discharge papers is has that she should be on Lovenox, but patient does not have that at home or an order for it.  Patient was on Eliquis before she went into the hospital.  She just to clarify if patient should be back on Eliquis.

## 2019-11-01 NOTE — Telephone Encounter (Signed)
Betty Jackson from [place] wanted to know if Dr. Elease Hashimoto would fill the following prescriptions   fentaNYL (DURAGESIC) 12 MCG/HR   ferrous sulfate 325 (65 FE) MG tablet    [facility] [phone]  Please advise

## 2019-11-01 NOTE — Telephone Encounter (Signed)
Patient should not be on lovenox, this was a mistake by the hospitalist. I cannot speak to the Eliquis as the hospitalist note says she was not on Eliquis due to falls. I will let Dr. Angelena Form address this

## 2019-11-02 ENCOUNTER — Telehealth: Payer: Self-pay | Admitting: Family Medicine

## 2019-11-02 NOTE — Telephone Encounter (Signed)
Spoke with pt's daughter who is unsure whether they were told to stop Eliquis. Reviewed chart:  10/29/19 progress note: Paroxysmal atrial fibrillation: Controlled on Cardizem and metoprolol not on anticoagulation due to multiple falls. In the house she is in DVT prophylaxis heart rate is rate controlled.  7/26 discharge summary: PAF (paroxysmal atrial fibrillation) (HCC) -Continue Lovenox and Cardizem -Was taken off of Eliquis by cardiology due to her increasing fall risk  7/2 discharge summary: PAF: Continue metoprolol/Cardizem for rate control-evaluated by cardiology-due to frequent falls-recommendations are not to resume Eliquis.

## 2019-11-02 NOTE — Telephone Encounter (Signed)
Betsy from [facility] said they cannot fill the order for fentaNYL (DURAGESIC) 12 MCG/HR   They do not carry it. Said to try her regular pharmacy.  Please advise

## 2019-11-03 DIAGNOSIS — J440 Chronic obstructive pulmonary disease with acute lower respiratory infection: Secondary | ICD-10-CM | POA: Diagnosis not present

## 2019-11-03 DIAGNOSIS — A419 Sepsis, unspecified organism: Secondary | ICD-10-CM | POA: Diagnosis not present

## 2019-11-03 DIAGNOSIS — M9701XD Periprosthetic fracture around internal prosthetic right hip joint, subsequent encounter: Secondary | ICD-10-CM | POA: Diagnosis not present

## 2019-11-03 DIAGNOSIS — S72001D Fracture of unspecified part of neck of right femur, subsequent encounter for closed fracture with routine healing: Secondary | ICD-10-CM | POA: Diagnosis not present

## 2019-11-03 MED ORDER — FENTANYL 12 MCG/HR TD PT72: 1.0000 | MEDICATED_PATCH | TRANSDERMAL | 0 refills | Status: DC

## 2019-11-03 NOTE — Telephone Encounter (Signed)
Please advise [id number] [place] [place] [address] [place]

## 2019-11-03 NOTE — Addendum Note (Signed)
Addended by: Eulas Post on: 11/03/2019 01:33 PM   Modules accepted: Orders

## 2019-11-03 NOTE — Telephone Encounter (Signed)
Arville Go, nurse with [facility] is returning call.

## 2019-11-03 NOTE — Telephone Encounter (Signed)
Lm to call back ./cy 

## 2019-11-03 NOTE — Telephone Encounter (Signed)
It looks like Crenshaw said in his 09/27/19 note that it should be stopped and not restarted. I did not see this. Thanks, chris

## 2019-11-03 NOTE — Telephone Encounter (Signed)
Betty Jackson aware of recommendations and will relay this to the family ./cy

## 2019-11-06 ENCOUNTER — Encounter: Payer: Self-pay | Admitting: Family Medicine

## 2019-11-07 ENCOUNTER — Other Ambulatory Visit: Payer: Self-pay | Admitting: Family Medicine

## 2019-11-07 ENCOUNTER — Telehealth: Payer: Self-pay | Admitting: Family Medicine

## 2019-11-07 NOTE — Telephone Encounter (Signed)
Please advise if okay to fill  

## 2019-11-07 NOTE — Telephone Encounter (Signed)
pt need a refill for HYDROcodone-acetaminophen (NORCO) 10-325 MG tablet [id number] [place] [place] [facility] [place]  Phone:  [phone] Fax:  [phone]

## 2019-11-07 NOTE — Telephone Encounter (Signed)
I do not see this medication on her list.  Do we know where this request came from?  It appears she was taken off losartan at some point

## 2019-11-07 NOTE — Telephone Encounter (Signed)
Please advise 

## 2019-11-08 ENCOUNTER — Telehealth (INDEPENDENT_AMBULATORY_CARE_PROVIDER_SITE_OTHER): Payer: Medicare Other | Admitting: Family Medicine

## 2019-11-08 ENCOUNTER — Encounter: Payer: Self-pay | Admitting: Family Medicine

## 2019-11-08 VITALS — Ht 64.0 in | Wt 150.0 lb

## 2019-11-08 DIAGNOSIS — M545 Low back pain: Secondary | ICD-10-CM

## 2019-11-08 DIAGNOSIS — G8929 Other chronic pain: Secondary | ICD-10-CM

## 2019-11-08 DIAGNOSIS — S72401A Unspecified fracture of lower end of right femur, initial encounter for closed fracture: Secondary | ICD-10-CM

## 2019-11-08 DIAGNOSIS — S72431D Displaced fracture of medial condyle of right femur, subsequent encounter for closed fracture with routine healing: Secondary | ICD-10-CM | POA: Diagnosis not present

## 2019-11-08 DIAGNOSIS — J189 Pneumonia, unspecified organism: Secondary | ICD-10-CM | POA: Diagnosis not present

## 2019-11-08 DIAGNOSIS — N1831 Chronic kidney disease, stage 3a: Secondary | ICD-10-CM | POA: Diagnosis not present

## 2019-11-08 DIAGNOSIS — T84498D Other mechanical complication of other internal orthopedic devices, implants and grafts, subsequent encounter: Secondary | ICD-10-CM | POA: Diagnosis not present

## 2019-11-08 DIAGNOSIS — S72451D Displaced supracondylar fracture without intracondylar extension of lower end of right femur, subsequent encounter for closed fracture with routine healing: Secondary | ICD-10-CM | POA: Diagnosis not present

## 2019-11-08 DIAGNOSIS — Z7189 Other specified counseling: Secondary | ICD-10-CM

## 2019-11-08 DIAGNOSIS — F5104 Psychophysiologic insomnia: Secondary | ICD-10-CM

## 2019-11-08 MED ORDER — HYDROCODONE-ACETAMINOPHEN 10-325 MG PO TABS: ORAL_TABLET | ORAL | 0 refills | Status: DC

## 2019-11-08 NOTE — Addendum Note (Signed)
Addended by: Rodman Key on: 11/08/2019 11:20 AM   Modules accepted: Orders

## 2019-11-08 NOTE — Telephone Encounter (Signed)
lovenox removed from mediation list per PharmD.

## 2019-11-08 NOTE — Progress Notes (Signed)
Patient ID: Betty Jackson, female   DOB: Nov 05, 1934, 85 y.o.   MRN: [id number]   This visit type was conducted due to national recommendations for restrictions regarding the COVID-19 pandemic in an effort to limit this patient's exposure and mitigate transmission in our community.   Virtual Visit via Telephone Note  I connected with Alfrieda Tarry on 11/08/19 at  1:15 PM EDT by telephone and verified that I am speaking with the correct person using two identifiers.   I discussed the limitations, risks, security and privacy concerns of performing an evaluation and management service by telephone and the availability of in person appointments. I also discussed with the patient that there may be a patient responsible charge related to this service. The patient expressed understanding and agreed to proceed.  Location patient: home Location provider: work or home office Participants present for the call: patient, provider Patient did not have a visit in the prior 7 days to address this/these issue(s).   History of Present Illness: Betty Jackson has multiple chronic problems and this visit is for follow-up from recent hospitalizations.  Her chronic problems are A. fib, CAD, congestive heart failure, history of temporal arteritis, hypertension, diverticulitis, history of CVA, chronic back pain, chronic insomnia  Recent history is that she was admitted June 28 and discharged on July 2 following a fall at home.  She had right distal femur fracture.  She underwent open reduction internal fixation June 29 with recommendations for nonweightbearing for 6 to 8 weeks.  She was in skilled nursing facility for rehab and on 20 July fell out of bed trying to turn off her light.  She unfortunately refractured distal right femur.  She underwent open reduction internal fixation of distal right femur fracture on 10/17/2019.  Her hemoglobin trended down to 6.5 and she received 2 units of packed red blood cells with good response  She  was then readmitted a third time July 28 with lethargy and fever.  Diagnosed with healthcare associated pneumonia.  She had sepsis.  Covid testing negative.  She was treated with broad-spectrum antibiotics.  She had right forearm cellulitis treated with doxycycline.  Acute kidney injury on chronic kidney disease.  She has some acute metabolic encephalopathy  She is currently at home under palliative care.  She has very supportive family.  She has caregivers at home 24/7.  She cannot ambulate.  She states her right forearm cellulitis changes have resolved.  No fever.  Recent metabolic encephalopathy improved.  She seems cognitively clear at this time and has good recollection of recent events.  She has chronic back pain which is nonoperative at this time.  She has had generally good response with Duragesic patch 12 mcg every 3 days.  She uses hydrocodone for breakthrough pain.  Both of these medications were refilled recently including the hydrocodone this morning.  Denies any current constipation issues.  Past Medical History:  Diagnosis Date   Acute respiratory failure with hypoxia ([place]) 05/20/2017   Anemia, unspecified 10/28/2012   Anxiety state, unspecified 10/28/2012   CAD (coronary artery disease)    a. Stent RCA in [facility];  b. Cath approx 2009 - nonobs per pt report.   Cataract    immature on the left eye   Chronic insomnia 02/07/2013   Chronic lower back pain    scoliosis   CKD (chronic kidney disease) stage 3, GFR 30-59 ml/min    CVA (cerebral infarction) 10/29/2012   DDD (degenerative disc disease)    Depression  Diverticulosis    GERD (gastroesophageal reflux disease) 09/01/2010   Hemorrhoids    Herniated nucleus pulposus, L5-S1, right 11/04/2015   Hyperlipidemia    takes Lipitor daily   Hypertension    takes Amlodipine,Losartan,Metoprolol,and HCTZ daily   Incontinence of urine    Insomnia    takes Restoril nightly   Lumbar stenosis 04/25/2013   Major  depressive disorder, recurrent episode, moderate ([place]) 07/16/2013   Osteoporosis    PAF (paroxysmal atrial fibrillation) ([place]) 2011   a. lone epidode in 2011 according to notes.   Rheumatoid arthritis ([place])    Scoliosis    Sepsis ([place]) 05/2019   Temporal arteritis ([place]) 2011   a. followed @ [facility]; potential flareup 10/28/2012/notes 10/28/2012   Vocal cord dysfunction    "they don't operate properly" (10/28/2012)   Past Surgical History:  Procedure Laterality Date   ABDOMINAL HYSTERECTOMY  ~ 1984   vaginally   BACK SURGERY  7-6yrs ago   X Stop   BLADDER SUSPENSION  2001   BREAST BIOPSY Right    CATARACT EXTRACTION W/ INTRAOCULAR LENS IMPLANT Right ~ 08/2012   CHEST TUBE INSERTION Left 03/08/2019   Procedure: Chest Tube Insertion;  Surgeon: Ivin Poot, MD;  Location: [place];  Service: Thoracic;  Laterality: Left;   COLONOSCOPY  01/26/2012   Procedure: COLONOSCOPY;  Surgeon: Ladene Artist, MD,FACG;  Location: [facility] ENDOSCOPY;  Service: Endoscopy;  Laterality: N/A;  note the EGD is possible   CORONARY ANGIOPLASTY WITH STENT PLACEMENT  2006   X 1 stent   EPIDURAL BLOCK INJECTION     ESOPHAGOGASTRODUODENOSCOPY  01/26/2012   Procedure: ESOPHAGOGASTRODUODENOSCOPY (EGD);  Surgeon: Ladene Artist, MD,FACG;  Location: [facility] ENDOSCOPY;  Service: Endoscopy;  Laterality: N/A;   FINE NEEDLE ASPIRATION Right 09/26/2019   Procedure: FINE NEEDLE ASPIRATION;  Surgeon: Altamese Gillham, MD;  Location: [place];  Service: Orthopedics;  Laterality: Right;   HEMIARTHROPLASTY HIP Right 2012   IR EPIDUROGRAPHY  04/20/2019   LAPAROSCOPIC CHOLECYSTECTOMY  2001   LUMBAR FUSION  03/2013   LUMBAR LAMINECTOMY/DECOMPRESSION MICRODISCECTOMY Right 11/04/2015   Procedure: Right Lumbar Five-Sacral One Microdiskectomy;  Surgeon: Kristeen Miss, MD;  Location: [place] [facility];  Service: Neurosurgery;  Laterality: Right;  Right L5-S1 Microdiskectomy   moles removed that required stiches     one on leg and one on  face   ORIF FEMUR FRACTURE Right 09/26/2019   Procedure: OPEN REDUCTION INTERNAL FIXATION (ORIF) DISTAL FEMUR FRACTURE;  Surgeon: Altamese Ballard, MD;  Location: [place];  Service: Orthopedics;  Laterality: Right;   ORIF FEMUR FRACTURE Right 10/17/2019   Procedure: OPEN REDUCTION INTERNAL FIXATION (ORIF) DISTAL FEMUR FRACTURE;  Surgeon: Altamese , MD;  Location: [place];  Service: Orthopedics;  Laterality: Right;   SUBXYPHOID PERICARDIAL WINDOW N/A 03/08/2019   Procedure: SUBXYPHOID PERICARDIAL WINDOW;  Surgeon: Ivin Poot, MD;  Location: [place];  Service: Thoracic;  Laterality: N/A;   TEE WITHOUT CARDIOVERSION N/A 03/08/2019   Procedure: TRANSESOPHAGEAL ECHOCARDIOGRAM (TEE);  Surgeon: Prescott Gum, Collier Salina, MD;  Location: [place];  Service: Thoracic;  Laterality: N/A;   TEMPORAL ARTERY BIOPSY / LIGATION Bilateral 2011   TONSILLECTOMY AND ADENOIDECTOMY     at age 81   [place]  ~ [place]  ~ 2010   "lower back" (10/28/2012)    reports that she has never smoked. She has never used smokeless tobacco. She reports current alcohol use. She reports that she does not use drugs. family history includes Brain cancer (  age of onset: 86) in her mother; Breast cancer (age of onset: 33) in her daughter; Heart attack in her father; Heart disease in an other family member; Hypertension in an other family member. Allergies  Allergen Reactions   Dextromethorphan Rash      Observations/Objective: Patient sounds cheerful and well on the phone. I do not appreciate any SOB. Speech and thought processing are grossly intact. Patient reported vitals:  Assessment and Plan:  #1 recent fall with distal right femur fracture which was repaired with subsequent repeat injury and open reduction internal fixation right femur fracture July 20.  She is nonweightbearing for several weeks  #2 recent healthcare associated pneumonia. -She has completed antibiotics at this time and has no fever  and no significant cough  #3 recent right forearm cellulitis reportedly improved following doxycycline  #4 recent acute metabolic encephalopathy improved  #5 acute on chronic anemia with recent hemoglobin 6.5 improved following transmission of 2 units red blood cells.  This dropped following her injury and surgery  #6 chronic back pain currently stable on Duragesic patch.  She uses hydrocodone for severe breakthrough pain.  #7 temporal arteritis maintained on low-dose prednisone.  She has been followed by rheumatology for this  #8 goals of care.  She is currently under palliative care.  Family very comfortable with this.  She is DNR.  Follow Up Instructions:  -1 month and sooner as needed   99441 5-10 99442 11-20 99443 21-30 I did not refer this patient for an OV in the next 24 hours for this/these issue(s).  I discussed the assessment and treatment plan with the patient. The patient was provided an opportunity to ask questions and all were answered. The patient agreed with the plan and demonstrated an understanding of the instructions.   The patient was advised to call back or seek an in-person evaluation if the symptoms worsen or if the condition fails to improve as anticipated.  I provided 35 minutes of non-face-to-face time during this encounter.   Carolann Littler, MD

## 2019-11-09 DIAGNOSIS — S72001D Fracture of unspecified part of neck of right femur, subsequent encounter for closed fracture with routine healing: Secondary | ICD-10-CM | POA: Diagnosis not present

## 2019-11-09 DIAGNOSIS — A419 Sepsis, unspecified organism: Secondary | ICD-10-CM | POA: Diagnosis not present

## 2019-11-09 DIAGNOSIS — M9701XD Periprosthetic fracture around internal prosthetic right hip joint, subsequent encounter: Secondary | ICD-10-CM | POA: Diagnosis not present

## 2019-11-09 DIAGNOSIS — J440 Chronic obstructive pulmonary disease with acute lower respiratory infection: Secondary | ICD-10-CM | POA: Diagnosis not present

## 2019-11-13 ENCOUNTER — Telehealth: Payer: Self-pay | Admitting: Family Medicine

## 2019-11-13 NOTE — Telephone Encounter (Signed)
Daughter called to say that the caregiver said pt has a yeast infection on her back and outside of the private area.  Wanted to see if something could be called in for pt.  [id number] [address], [place], [place] [address]  Phone:  [phone] Fax:  [phone]   Please advise

## 2019-11-14 MED ORDER — FLUCONAZOLE 150 MG PO TABS
150.0000 mg | ORAL_TABLET | Freq: Once | ORAL | 0 refills | Status: AC
Start: 2019-11-14 — End: 2019-11-14

## 2019-11-14 NOTE — Telephone Encounter (Signed)
Send in fluconazole 150 mg x 1 dose.  If they have not already tried can try over-the-counter Lotrimin topical as well

## 2019-11-14 NOTE — Telephone Encounter (Signed)
Rx sent in. Spoke with pt's daughter, she is aware.

## 2019-11-14 NOTE — Telephone Encounter (Signed)
Please advise 

## 2019-11-15 DIAGNOSIS — A419 Sepsis, unspecified organism: Secondary | ICD-10-CM | POA: Diagnosis not present

## 2019-11-15 DIAGNOSIS — M9701XD Periprosthetic fracture around internal prosthetic right hip joint, subsequent encounter: Secondary | ICD-10-CM | POA: Diagnosis not present

## 2019-11-15 DIAGNOSIS — S72001D Fracture of unspecified part of neck of right femur, subsequent encounter for closed fracture with routine healing: Secondary | ICD-10-CM | POA: Diagnosis not present

## 2019-11-15 DIAGNOSIS — J440 Chronic obstructive pulmonary disease with acute lower respiratory infection: Secondary | ICD-10-CM | POA: Diagnosis not present

## 2019-11-16 ENCOUNTER — Telehealth: Payer: Self-pay | Admitting: Family Medicine

## 2019-11-16 DIAGNOSIS — M9701XD Periprosthetic fracture around internal prosthetic right hip joint, subsequent encounter: Secondary | ICD-10-CM | POA: Diagnosis not present

## 2019-11-16 DIAGNOSIS — G8929 Other chronic pain: Secondary | ICD-10-CM

## 2019-11-16 DIAGNOSIS — J440 Chronic obstructive pulmonary disease with acute lower respiratory infection: Secondary | ICD-10-CM | POA: Diagnosis not present

## 2019-11-16 DIAGNOSIS — A419 Sepsis, unspecified organism: Secondary | ICD-10-CM | POA: Diagnosis not present

## 2019-11-16 DIAGNOSIS — M545 Low back pain, unspecified: Secondary | ICD-10-CM

## 2019-11-16 DIAGNOSIS — S72001D Fracture of unspecified part of neck of right femur, subsequent encounter for closed fracture with routine healing: Secondary | ICD-10-CM | POA: Diagnosis not present

## 2019-11-16 NOTE — Telephone Encounter (Signed)
[facility] would like to know if the patient can have an order for a wheelchair with sides come down or drop-down and a Transfer board.Please  calling Almyra Free [phone], the nurse

## 2019-11-17 ENCOUNTER — Encounter: Payer: Self-pay | Admitting: Family Medicine

## 2019-11-17 NOTE — Telephone Encounter (Signed)
Please advise and order

## 2019-11-17 NOTE — Telephone Encounter (Signed)
Ordered

## 2019-11-20 ENCOUNTER — Telehealth: Payer: Self-pay | Admitting: Family Medicine

## 2019-11-20 DIAGNOSIS — M9701XD Periprosthetic fracture around internal prosthetic right hip joint, subsequent encounter: Secondary | ICD-10-CM | POA: Diagnosis not present

## 2019-11-20 DIAGNOSIS — S72001D Fracture of unspecified part of neck of right femur, subsequent encounter for closed fracture with routine healing: Secondary | ICD-10-CM | POA: Diagnosis not present

## 2019-11-20 DIAGNOSIS — A419 Sepsis, unspecified organism: Secondary | ICD-10-CM | POA: Diagnosis not present

## 2019-11-20 DIAGNOSIS — J440 Chronic obstructive pulmonary disease with acute lower respiratory infection: Secondary | ICD-10-CM | POA: Diagnosis not present

## 2019-11-20 NOTE — Telephone Encounter (Signed)
Harley Alto from [facility] is asking for a refill on the Hydrocodone 10-325 MG tab for the pt?   Almyra Free stated she would like a call back if this will be refilled or not at [phone]  Pharmacy:  [id number] - [place], [place] Phone:  [phone]  Fax:  [phone]

## 2019-11-21 MED ORDER — HYDROCODONE-ACETAMINOPHEN 10-325 MG PO TABS: ORAL_TABLET | ORAL | 0 refills | Status: DC

## 2019-11-21 NOTE — Telephone Encounter (Signed)
Please advise and if okay send in refill

## 2019-11-21 NOTE — Telephone Encounter (Signed)
I will send in one refill   This should be used only for severe breakthrough pain.  If she is requiring more regular use of this we might have to consider titrating her duragesic patch.

## 2019-11-21 NOTE — Addendum Note (Signed)
Addended by: Eulas Post on: 11/21/2019 01:01 PM   Modules accepted: Orders

## 2019-11-21 NOTE — Telephone Encounter (Signed)
Left detailed message for Betty Jackson letting her know we sent in medication

## 2019-11-23 DIAGNOSIS — J440 Chronic obstructive pulmonary disease with acute lower respiratory infection: Secondary | ICD-10-CM | POA: Diagnosis not present

## 2019-11-23 DIAGNOSIS — M9701XD Periprosthetic fracture around internal prosthetic right hip joint, subsequent encounter: Secondary | ICD-10-CM | POA: Diagnosis not present

## 2019-11-23 DIAGNOSIS — S72001D Fracture of unspecified part of neck of right femur, subsequent encounter for closed fracture with routine healing: Secondary | ICD-10-CM | POA: Diagnosis not present

## 2019-11-23 DIAGNOSIS — A419 Sepsis, unspecified organism: Secondary | ICD-10-CM | POA: Diagnosis not present

## 2019-11-29 DIAGNOSIS — J440 Chronic obstructive pulmonary disease with acute lower respiratory infection: Secondary | ICD-10-CM | POA: Diagnosis not present

## 2019-11-29 DIAGNOSIS — A419 Sepsis, unspecified organism: Secondary | ICD-10-CM | POA: Diagnosis not present

## 2019-11-29 DIAGNOSIS — S72001D Fracture of unspecified part of neck of right femur, subsequent encounter for closed fracture with routine healing: Secondary | ICD-10-CM | POA: Diagnosis not present

## 2019-11-29 DIAGNOSIS — M9701XD Periprosthetic fracture around internal prosthetic right hip joint, subsequent encounter: Secondary | ICD-10-CM | POA: Diagnosis not present

## 2019-11-30 ENCOUNTER — Telehealth: Payer: Self-pay | Admitting: Family Medicine

## 2019-11-30 DIAGNOSIS — M9701XD Periprosthetic fracture around internal prosthetic right hip joint, subsequent encounter: Secondary | ICD-10-CM | POA: Diagnosis not present

## 2019-11-30 DIAGNOSIS — R531 Weakness: Secondary | ICD-10-CM | POA: Diagnosis not present

## 2019-11-30 DIAGNOSIS — A419 Sepsis, unspecified organism: Secondary | ICD-10-CM | POA: Diagnosis not present

## 2019-11-30 DIAGNOSIS — J440 Chronic obstructive pulmonary disease with acute lower respiratory infection: Secondary | ICD-10-CM | POA: Diagnosis not present

## 2019-11-30 DIAGNOSIS — I5032 Chronic diastolic (congestive) heart failure: Secondary | ICD-10-CM | POA: Diagnosis not present

## 2019-11-30 DIAGNOSIS — S72401A Unspecified fracture of lower end of right femur, initial encounter for closed fracture: Secondary | ICD-10-CM | POA: Diagnosis not present

## 2019-11-30 DIAGNOSIS — M5127 Other intervertebral disc displacement, lumbosacral region: Secondary | ICD-10-CM | POA: Diagnosis not present

## 2019-11-30 DIAGNOSIS — S72001D Fracture of unspecified part of neck of right femur, subsequent encounter for closed fracture with routine healing: Secondary | ICD-10-CM | POA: Diagnosis not present

## 2019-11-30 NOTE — Telephone Encounter (Signed)
Carita Pian w/[facility] is calling to get verbal orders to continue to do PT 1 week for 4 week for the pts R leg that is still not strong enough and would like to extend the order.  Can leave msg on secured voice mail.

## 2019-12-01 MED ORDER — POTASSIUM CHLORIDE CRYS ER 10 MEQ PO TBCR: 10.0000 meq | EXTENDED_RELEASE_TABLET | Freq: Every day | ORAL | 2 refills | Status: DC

## 2019-12-01 NOTE — Telephone Encounter (Signed)
Okay for verbal orders. 

## 2019-12-01 NOTE — Telephone Encounter (Signed)
OK 

## 2019-12-01 NOTE — Telephone Encounter (Signed)
LVM for verbal orders

## 2019-12-06 DIAGNOSIS — S72431D Displaced fracture of medial condyle of right femur, subsequent encounter for closed fracture with routine healing: Secondary | ICD-10-CM | POA: Diagnosis not present

## 2019-12-06 DIAGNOSIS — S72001D Fracture of unspecified part of neck of right femur, subsequent encounter for closed fracture with routine healing: Secondary | ICD-10-CM | POA: Diagnosis not present

## 2019-12-06 DIAGNOSIS — T84498D Other mechanical complication of other internal orthopedic devices, implants and grafts, subsequent encounter: Secondary | ICD-10-CM | POA: Diagnosis not present

## 2019-12-06 DIAGNOSIS — M9701XD Periprosthetic fracture around internal prosthetic right hip joint, subsequent encounter: Secondary | ICD-10-CM | POA: Diagnosis not present

## 2019-12-06 DIAGNOSIS — A419 Sepsis, unspecified organism: Secondary | ICD-10-CM | POA: Diagnosis not present

## 2019-12-06 DIAGNOSIS — S72451D Displaced supracondylar fracture without intracondylar extension of lower end of right femur, subsequent encounter for closed fracture with routine healing: Secondary | ICD-10-CM | POA: Diagnosis not present

## 2019-12-06 DIAGNOSIS — J440 Chronic obstructive pulmonary disease with acute lower respiratory infection: Secondary | ICD-10-CM | POA: Diagnosis not present

## 2019-12-06 NOTE — Telephone Encounter (Signed)
Okay for verbal 

## 2019-12-06 NOTE — Telephone Encounter (Signed)
Betty Jackson is needing verbal orders to move her PT to twice a week for 4 weeks?   He can be reached at [phone] -ok to leave a detailed message

## 2019-12-07 ENCOUNTER — Encounter: Payer: Self-pay | Admitting: Family Medicine

## 2019-12-07 NOTE — Telephone Encounter (Signed)
OK 

## 2019-12-08 MED ORDER — FENTANYL 12 MCG/HR TD PT72: 1.0000 | MEDICATED_PATCH | TRANSDERMAL | 0 refills | Status: DC

## 2019-12-08 NOTE — Telephone Encounter (Signed)
Lvm for verbal orders for Betty Jackson.

## 2019-12-08 NOTE — Telephone Encounter (Signed)
Donald from [facility] needs verbal orders for OT 1w3 and modify ADL & transfer goals as she is now full weight bearing R lower extremity  He would also like on ok to continue with her in the new certification period starting 10/3?    Elenore Rota can be reached at [phone] -ok to leave detailed message

## 2019-12-08 NOTE — Telephone Encounter (Signed)
Last filled 11/03/2019 Last OV 09/01/2018  Ok to fill?

## 2019-12-12 ENCOUNTER — Encounter: Payer: Self-pay | Admitting: Family Medicine

## 2019-12-12 ENCOUNTER — Other Ambulatory Visit: Payer: Self-pay

## 2019-12-12 ENCOUNTER — Ambulatory Visit (INDEPENDENT_AMBULATORY_CARE_PROVIDER_SITE_OTHER): Payer: Medicare Other | Admitting: Family Medicine

## 2019-12-12 VITALS — BP 120/58 | HR 65 | Temp 98.1°F | Ht 64.0 in | Wt 151.0 lb

## 2019-12-12 DIAGNOSIS — M9701XD Periprosthetic fracture around internal prosthetic right hip joint, subsequent encounter: Secondary | ICD-10-CM | POA: Diagnosis not present

## 2019-12-12 DIAGNOSIS — Z23 Encounter for immunization: Secondary | ICD-10-CM | POA: Diagnosis not present

## 2019-12-12 DIAGNOSIS — D649 Anemia, unspecified: Secondary | ICD-10-CM

## 2019-12-12 DIAGNOSIS — G8929 Other chronic pain: Secondary | ICD-10-CM

## 2019-12-12 DIAGNOSIS — S72001D Fracture of unspecified part of neck of right femur, subsequent encounter for closed fracture with routine healing: Secondary | ICD-10-CM | POA: Diagnosis not present

## 2019-12-12 DIAGNOSIS — K5903 Drug induced constipation: Secondary | ICD-10-CM | POA: Diagnosis not present

## 2019-12-12 DIAGNOSIS — N1831 Chronic kidney disease, stage 3a: Secondary | ICD-10-CM

## 2019-12-12 DIAGNOSIS — I10 Essential (primary) hypertension: Secondary | ICD-10-CM | POA: Diagnosis not present

## 2019-12-12 DIAGNOSIS — A419 Sepsis, unspecified organism: Secondary | ICD-10-CM | POA: Diagnosis not present

## 2019-12-12 DIAGNOSIS — J440 Chronic obstructive pulmonary disease with acute lower respiratory infection: Secondary | ICD-10-CM | POA: Diagnosis not present

## 2019-12-12 DIAGNOSIS — M545 Low back pain: Secondary | ICD-10-CM

## 2019-12-12 NOTE — Telephone Encounter (Signed)
Betty Jackson was calling to check on his verbal orders for OT 1w3 and to continue new certification period starting 10/3  Betty Jackson [phone]

## 2019-12-12 NOTE — Progress Notes (Signed)
Established Patient Office Visit  Subjective:  Patient ID: Betty Jackson, female    DOB: [birth year]  Age: 85 y.o. MRN: [id number]  CC:  Chief Complaint  Patient presents with  . Rectal Bleeding    x1 week, tried stool softener, impaction resolved by home nurse this weekend  . Bleeding/Bruising    bruising noted in the right posterior calf, states home health nurse was informed by on-call doctor here to stop Eliquis    HPI Betty Jackson presents for medical follow-up..  She is accompanied today by her daughter.  She has complicated past medical history and has had multiple hospital admissions and surgeries within the past year.  She has history of atrial fibrillation, CAD, congestive heart failure, temporal arteritis, hypertension, history of CVA, osteoporosis, history of chronic back pain.  Currently followed by palliative care.  Her husband passed away earlier this year from complications of multiple medical problems.  She and her family seem to be coping fairly well.  She developed some recent increased constipation.  She is on Duragesic patch 12 mcg and also supplements with about 1 hydrocodone per day for breakthrough pain.  Overall, her pain is fairly well controlled.  She is had some recent straining and noticed some bright red blood per rectum with small pellet-like stools recently.  She had recent impaction and nurse came out and was able to disimpact.  She had not been taking her stool softener regularly.  She is now back on stool softener twice daily.  Her most recent hemoglobin was 9.0 back in July.  Over the weekend on-call physician reportedly after consultation had them hold her Eliquis because of the blood in stool which was noted on Sunday.  She denied any blood in the stool since then.  She does have a bruise on her right posterior leg and does not recall any injury.  She has had frequent falls in the past but is basically nonambulatory for the most part at this time unless she  has someone supervising her.  She has recently started up some therapy at home daughter thinks this is helping.  Past Medical History:  Diagnosis Date  . Acute respiratory failure with hypoxia ([place]) 05/20/2017  . Anemia, unspecified 10/28/2012  . Anxiety state, unspecified 10/28/2012  . CAD (coronary artery disease)    a. Stent RCA in [facility];  b. Cath approx 2009 - nonobs per pt report.  . Cataract    immature on the left eye  . Chronic insomnia 02/07/2013  . Chronic lower back pain    scoliosis  . CKD (chronic kidney disease) stage 3, GFR 30-59 ml/min   . CVA (cerebral infarction) 10/29/2012  . DDD (degenerative disc disease)   . Depression   . Diverticulosis   . GERD (gastroesophageal reflux disease) 09/01/2010  . Hemorrhoids   . Herniated nucleus pulposus, L5-S1, right 11/04/2015  . Hyperlipidemia    takes Lipitor daily  . Hypertension    takes Amlodipine,Losartan,Metoprolol,and HCTZ daily  . Incontinence of urine   . Insomnia    takes Restoril nightly  . Lumbar stenosis 04/25/2013  . Major depressive disorder, recurrent episode, moderate ([place]) 07/16/2013  . Osteoporosis   . PAF (paroxysmal atrial fibrillation) ([place]) 2011   a. lone epidode in 2011 according to notes.  . Rheumatoid arthritis ([place])   . Scoliosis   . Sepsis ([place]) 05/2019  . Temporal arteritis ([place]) 2011   a. followed @ [facility]; potential flareup 10/28/2012/notes 10/28/2012  . Vocal  cord dysfunction    "they don't operate properly" (10/28/2012)    Past Surgical History:  Procedure Laterality Date  . ABDOMINAL HYSTERECTOMY  ~ 1984   vaginally  . BACK SURGERY  7-55yrs ago   X Stop  . BLADDER SUSPENSION  2001  . BREAST BIOPSY Right   . CATARACT EXTRACTION W/ INTRAOCULAR LENS IMPLANT Right ~ 08/2012  . CHEST TUBE INSERTION Left 03/08/2019   Procedure: Chest Tube Insertion;  Surgeon: Ivin Poot, MD;  Location: [place];  Service: Thoracic;  Laterality: Left;  . COLONOSCOPY  01/26/2012   Procedure: COLONOSCOPY;   Surgeon: Ladene Artist, MD,FACG;  Location[facility] END[facility]COPY;  Service: Endoscopy;  Laterality: N/A;  note the EGD is possible  . CORONARY ANGIOPLASTY WITH STENT PLACEMENT  2006   X 1 stent  . EPIDURAL BLOCK INJECTION    . ESOPHAGOGASTRODUODENOSCOPY  01/26/2012   Procedure: ESOPHAGOGASTRODUODENOSCOPY (EGD);  Surgeon: Ladene Artist, MD,FACG;  Location: [facility] ENDOSCOPY;  Service: Endoscopy;  Laterality: N/A;  . FINE NEEDLE ASPIRATION Right 09/26/2019   Procedure: FINE NEEDLE ASPIRATION;  Surgeon: Altamese [place], MD;  Location: [place];  Service: Orthopedics;  Laterality: Right;  . HEMIARTHROPLASTY HIP Right 2012  . IR EPIDUROGRAPHY  04/20/2019  . LAPAROSCOPIC CHOLECYSTECTOMY  2001  . LUMBAR FUSION  03/2013  . LUMBAR LAMINECTOMY/DECOMPRESSION MICRODISCECTOMY Right 11/04/2015   Procedure: Right Lumbar Five-Sacral One Microdiskectomy;  Surgeon: Kristeen Miss, MD;  Location: [place] NEURO ORS;  Service: Neurosurgery;  Laterality: Right;  Right L5-S1 Microdiskectomy  . moles removed that required stiches     one on leg and one on face  . ORIF FEMUR FRACTURE Right 09/26/2019   Procedure: OPEN REDUCTION INTERNAL FIXATION (ORIF) DISTAL FEMUR FRACTURE;  Surgeon: Altamese [place], MD;  Location: [place];  Service: Orthopedics;  Laterality: Right;  . ORIF FEMUR FRACTURE Right 10/17/2019   Procedure: OPEN REDUCTION INTERNAL FIXATION (ORIF) DISTAL FEMUR FRACTURE;  Surgeon: Altamese [place], MD;  Location: [place];  Service: Orthopedics;  Laterality: Right;  . SUBXYPHOID PERICARDIAL WINDOW N/A 03/08/2019   Procedure: SUBXYPHOID PERICARDIAL WINDOW;  Surgeon: Ivin Poot, MD;  Location: [place];  Service: Thoracic;  Laterality: N/A;  . TEE WITHOUT CARDIOVERSION N/A 03/08/2019   Procedure: TRANSESOPHAGEAL ECHOCARDIOGRAM (TEE);  Surgeon: Prescott Gum, Collier Salina, MD;  Location: [place];  Service: Thoracic;  Laterality: N/A;  . TEMPORAL ARTERY BIOPSY / LIGATION Bilateral 2011  . TONSILLECTOMY AND ADENOIDECTOMY     at age 10  . TUBAL LIGATION  ~  1982  . X-STOP IMPLANTATION  ~ 2010   "lower back" (10/28/2012)    Family History  Problem Relation Age of Onset  . Brain cancer Mother 33  . Heart attack Father   . Breast cancer Daughter 33  . Heart disease Other   . Hypertension Other   . Arthritis Neg Hx   . Colon cancer Neg Hx   . Osteoporosis Neg Hx     Social History   Socioeconomic History  . Marital status: Married    Spouse name: Not on file  . Number of children: 3  . Years of education: Not on file  . Highest education level: Not on file  Occupational History  . Occupation: Retired Cabin crew  Tobacco Use  . Smoking status: Never Smoker  . Smokeless tobacco: Never Used  Vaping Use  . Vaping Use: Never used  Substance and Sexual Activity  . Alcohol use: Yes    Comment: socially  . Drug use: No  . Sexual  activity: Not on file  Other Topics Concern  . Not on file  Social History Narrative   Lives in [place] with her husband.  Retired Cabin crew.   Social Determinants of Health   Financial Resource Strain:   . Difficulty of Paying Living Expenses: Not on file  Food Insecurity:   . Worried About Charity fundraiser in the Last Year: Not on file  . Ran Out of Food in the Last Year: Not on file  Transportation Needs:   . Lack of Transportation (Medical): Not on file  . Lack of Transportation (Non-Medical): Not on file  Physical Activity:   . Days of Exercise per Week: Not on file  . Minutes of Exercise per Session: Not on file  Stress:   . Feeling of Stress : Not on file  Social Connections:   . Frequency of Communication with Friends and Family: Not on file  . Frequency of Social Gatherings with Friends and Family: Not on file  . Attends Religious Services: Not on file  . Active Member of Clubs or Organizations: Not on file  . Attends Archivist Meetings: Not on file  . Marital Status: Not on file  Intimate Partner Violence:   . Fear of Current or Ex-Partner: Not on file  . Emotionally  Abused: Not on file  . Physically Abused: Not on file  . Sexually Abused: Not on file    Outpatient Medications Prior to Visit  Medication Sig Dispense Refill  . ALPRAZolam (XANAX) 0.5 MG tablet Take 1 tablet (0.5 mg total) by mouth at bedtime as needed for sleep. 7 tablet 0  . apixaban (ELIQUIS) 5 MG TABS tablet Take 5 mg by mouth 2 (two) times daily.    . cholecalciferol (VITAMIN D) 25 MCG tablet Take 2 tablets (2,000 Units total) by mouth 2 (two) times daily.    Marland Kitchen diltiazem (CARDIZEM CD) 240 MG 24 hr capsule Take 1 capsule (240 mg total) by mouth daily. 90 capsule 3  . escitalopram (LEXAPRO) 10 MG tablet Take 1 tablet (10 mg total) by mouth daily. 90 tablet 3  . famotidine (PEPCID) 20 MG tablet Take 1 tablet (20 mg total) by mouth daily. 30 tablet 1  . fentaNYL (DURAGESIC) 12 MCG/HR Place 1 patch onto the skin every 3 (three) days. 10 patch 0  . ferrous sulfate 325 (65 FE) MG tablet Take 1 tablet (325 mg total) by mouth 2 (two) times daily with a meal. 60 tablet 3  . furosemide (LASIX) 40 MG tablet Take 1 tablet (40 mg total) by mouth daily. 30 tablet 0  . HYDROcodone-acetaminophen (NORCO) 10-325 MG tablet Take one tablet by mouth every 8 hours as needed for breakthrough pain. 30 tablet 0  . LACTOBACILLUS PO Take 2 capsules by mouth 3 (three) times daily.    . metoprolol tartrate (LOPRESSOR) 50 MG tablet Take 1 tablet (50 mg total) by mouth 2 (two) times daily. 30 tablet 0  . Multiple Vitamin (MULTIVITAMIN WITH MINERALS) TABS tablet Take 1 tablet by mouth daily. 60 tablet 0  . potassium chloride (KLOR-CON) 10 MEQ tablet Take 1 tablet (10 mEq total) by mouth daily. 90 tablet 2  . predniSONE (DELTASONE) 10 MG tablet Take 1 tablet (10 mg total) by mouth daily with breakfast. 30 tablet 0  . senna-docusate (SENOKOT-S) 8.6-50 MG tablet Take 1 tablet by mouth at bedtime.    . vitamin C (ASCORBIC ACID) 500 MG tablet Take 1,000 mg by mouth daily.     Marland Kitchen  vitamin E (VITAMIN E) 400 UNIT capsule Take  400 Units by mouth daily.     No facility-administered medications prior to visit.    Allergies  Allergen Reactions  . Dextromethorphan Rash    ROS Review of Systems  Constitutional: Negative for chills and fever.  Respiratory: Negative for cough and shortness of breath.   Cardiovascular: Negative for chest pain, palpitations and leg swelling.  Gastrointestinal: Positive for constipation. Negative for nausea and vomiting.  Hematological: Bruises/bleeds easily.      Objective:    Physical Exam Vitals reviewed.  Cardiovascular:     Rate and Rhythm: Normal rate.  Pulmonary:     Effort: Pulmonary effort is normal.     Breath sounds: Normal breath sounds.  Musculoskeletal:     Right lower leg: No edema.     Left lower leg: No edema.  Skin:    Comments: She has a couple small bruises posterior right leg.  These are about 2 x 3 cm.  No breaks in the skin noted  Neurological:     Mental Status: She is alert.     BP (!) 120/58 (BP Location: Left Arm, Patient Position: Sitting, Cuff Size: Large)   Pulse 65   Temp 98.1 F (36.7 C) (Oral)   Ht 5\' 4"  (1.626 m)   Wt 151 lb (68.5 kg)   SpO2 94%   BMI 25.92 kg/m  Wt Readings from Last 3 Encounters:  12/12/19 151 lb (68.5 kg)  11/08/19 150 lb (68 kg)  10/30/19 150 lb (68 kg)     Health Maintenance Due  Topic Date Due  . FOOT EXAM  Never done  . OPHTHALMOLOGY EXAM  Never done  . URINE MICROALBUMIN  Never done  . COVID-19 Vaccine (1) Never done  . DEXA SCAN  Never done  . PNA vac Low Risk Adult (2 of 2 - PPSV23) 04/02/2009  . HEMOGLOBIN A1C  09/05/2019    There are no preventive care reminders to display for this patient.  Lab Results  Component Value Date   TSH 3.085 03/06/2019   Lab Results  Component Value Date   WBC 13.1 (H) 10/27/2019   HGB 9.0 (L) 10/27/2019   HCT 28.2 (L) 10/27/2019   MCV 92.2 10/27/2019   PLT 346 10/27/2019   Lab Results  Component Value Date   NA 138 10/30/2019   K 3.7  10/30/2019   CO2 29 10/30/2019   GLUCOSE 90 10/30/2019   BUN 11 10/30/2019   CREATININE 0.64 10/30/2019   BILITOT 1.6 (H) 10/26/2019   ALKPHOS 121 10/26/2019   AST 17 10/26/2019   ALT 16 10/26/2019   PROT 5.1 (L) 10/26/2019   ALBUMIN 2.1 (L) 10/26/2019   CALCIUM 8.6 (L) 10/30/2019   ANIONGAP 11 10/30/2019   GFR 52.36 (L) 06/09/2019   Lab Results  Component Value Date   CHOL 171 11/14/2018   Lab Results  Component Value Date   HDL 63.40 11/14/2018   Lab Results  Component Value Date   LDLCALC 74 11/14/2018   Lab Results  Component Value Date   TRIG 168.0 (H) 11/14/2018   Lab Results  Component Value Date   CHOLHDL 3 11/14/2018   Lab Results  Component Value Date   HGBA1C 5.9 (H) 03/07/2019      Assessment & Plan:   #1 recent hematochezia.  Suspect most likely related to her straining.  She likely has some internal hemorrhoids.  Could have small fissure.  They are not interested in  further aggressive evaluation.  -We discussed measures to reduce constipation.  She is drinking plenty of fluids.  Increase fiber and recommend stool softener with Senokot-S twice daily -May also supplement with MiraLAX once daily as needed -Touch base for any heavy recurrent rectal bleeding but it sounds like her bleeding above was relatively light  #2 history of atrial fibrillation. -We recommended cautiously trying to restart her Eliquis.  She has very high risk of clotting and very immobile.  Even though she does have high risk of falls she is not walking unassisted and is mostly bed and wheelchair bound at this time -Check CBC and basic metabolic panel  #3 chronic pain mostly involving her back.  Nonoperative.  -Continue Duragesic patch 12 mcg every 72 hours and supplementing with hydrocodone as needed for breakthrough pain  #4 health maintenance -Flu vaccine given   No orders of the defined types were placed in this encounter.   Follow-up: No follow-ups on file.     Carolann Littler, MD

## 2019-12-12 NOTE — Patient Instructions (Signed)

## 2019-12-12 NOTE — Telephone Encounter (Signed)
OK 

## 2019-12-12 NOTE — Telephone Encounter (Signed)
Okay for verbal 

## 2019-12-12 NOTE — Telephone Encounter (Signed)
Lvm for verbal orders for Elenore Rota.

## 2019-12-13 DIAGNOSIS — J440 Chronic obstructive pulmonary disease with acute lower respiratory infection: Secondary | ICD-10-CM | POA: Diagnosis not present

## 2019-12-13 DIAGNOSIS — A419 Sepsis, unspecified organism: Secondary | ICD-10-CM | POA: Diagnosis not present

## 2019-12-13 DIAGNOSIS — S72001D Fracture of unspecified part of neck of right femur, subsequent encounter for closed fracture with routine healing: Secondary | ICD-10-CM | POA: Diagnosis not present

## 2019-12-13 DIAGNOSIS — M9701XD Periprosthetic fracture around internal prosthetic right hip joint, subsequent encounter: Secondary | ICD-10-CM | POA: Diagnosis not present

## 2019-12-13 LAB — CBC WITH DIFFERENTIAL/PLATELET
Absolute Monocytes: 744 cells/uL (ref 200–950)
Basophils Absolute: 43 cells/uL (ref 0–200)
Basophils Relative: 0.3 %
Eosinophils Absolute: 0 cells/uL — ABNORMAL LOW (ref 15–500)
Eosinophils Relative: 0 %
HCT: 36.5 % (ref 35.0–45.0)
Hemoglobin: 12 g/dL (ref 11.7–15.5)
Lymphs Abs: 1087 cells/uL (ref 850–3900)
MCH: 30 pg (ref 27.0–33.0)
MCHC: 32.9 g/dL (ref 32.0–36.0)
MCV: 91.3 fL (ref 80.0–100.0)
MPV: 10.1 fL (ref 7.5–12.5)
Monocytes Relative: 5.2 %
Neutro Abs: 12427 cells/uL — ABNORMAL HIGH (ref 1500–7800)
Neutrophils Relative %: 86.9 %
Platelets: 231 10*3/uL (ref 140–400)
RBC: 4 10*6/uL (ref 3.80–5.10)
RDW: 14.2 % (ref 11.0–15.0)
Total Lymphocyte: 7.6 %
WBC: 14.3 10*3/uL — ABNORMAL HIGH (ref 3.8–10.8)

## 2019-12-13 LAB — BASIC METABOLIC PANEL
BUN: 13 mg/dL (ref 7–25)
CO2: 27 mmol/L (ref 20–32)
Calcium: 9.3 mg/dL (ref 8.6–10.4)
Chloride: 101 mmol/L (ref 98–110)
Creat: 0.75 mg/dL (ref 0.60–0.88)
Glucose, Bld: 127 mg/dL — ABNORMAL HIGH (ref 65–99)
Potassium: 3.7 mmol/L (ref 3.5–5.3)
Sodium: 139 mmol/L (ref 135–146)

## 2019-12-14 DIAGNOSIS — M9701XD Periprosthetic fracture around internal prosthetic right hip joint, subsequent encounter: Secondary | ICD-10-CM | POA: Diagnosis not present

## 2019-12-14 DIAGNOSIS — S72001D Fracture of unspecified part of neck of right femur, subsequent encounter for closed fracture with routine healing: Secondary | ICD-10-CM | POA: Diagnosis not present

## 2019-12-14 DIAGNOSIS — J440 Chronic obstructive pulmonary disease with acute lower respiratory infection: Secondary | ICD-10-CM | POA: Diagnosis not present

## 2019-12-14 DIAGNOSIS — A419 Sepsis, unspecified organism: Secondary | ICD-10-CM | POA: Diagnosis not present

## 2019-12-19 ENCOUNTER — Other Ambulatory Visit: Payer: Self-pay | Admitting: Family Medicine

## 2019-12-19 DIAGNOSIS — J440 Chronic obstructive pulmonary disease with acute lower respiratory infection: Secondary | ICD-10-CM | POA: Diagnosis not present

## 2019-12-19 DIAGNOSIS — S72001D Fracture of unspecified part of neck of right femur, subsequent encounter for closed fracture with routine healing: Secondary | ICD-10-CM | POA: Diagnosis not present

## 2019-12-19 DIAGNOSIS — A419 Sepsis, unspecified organism: Secondary | ICD-10-CM | POA: Diagnosis not present

## 2019-12-19 DIAGNOSIS — M9701XD Periprosthetic fracture around internal prosthetic right hip joint, subsequent encounter: Secondary | ICD-10-CM | POA: Diagnosis not present

## 2019-12-19 MED ORDER — HYDROCODONE-ACETAMINOPHEN 10-325 MG PO TABS: ORAL_TABLET | ORAL | 0 refills | Status: DC

## 2019-12-19 NOTE — Telephone Encounter (Signed)
Okay for refill? I do not see last chronic pain management visit not sure if I am over looking it  Please advise

## 2019-12-19 NOTE — Addendum Note (Signed)
Addended by: Eulas Post on: 12/19/2019 08:03 PM   Modules accepted: Orders

## 2019-12-19 NOTE — Telephone Encounter (Signed)
Almyra Free called to say pt needs a refill on her medication  HYDROcodone-acetaminophen ([place]) 10-325 MG tablet   [id number] [address]  [place], [place] [address]  Phone:  [phone] Fax:  [phone]

## 2019-12-19 NOTE — Telephone Encounter (Signed)
She is on Palliative Care and has had multiple recent hospitalizations, so we are trying to reduce her office exposure at this time where we can.  If she stabilizes we will get back on every 3 month cycle. I refilled.

## 2019-12-20 DIAGNOSIS — A419 Sepsis, unspecified organism: Secondary | ICD-10-CM | POA: Diagnosis not present

## 2019-12-20 DIAGNOSIS — J440 Chronic obstructive pulmonary disease with acute lower respiratory infection: Secondary | ICD-10-CM | POA: Diagnosis not present

## 2019-12-20 DIAGNOSIS — S72001D Fracture of unspecified part of neck of right femur, subsequent encounter for closed fracture with routine healing: Secondary | ICD-10-CM | POA: Diagnosis not present

## 2019-12-20 DIAGNOSIS — M9701XD Periprosthetic fracture around internal prosthetic right hip joint, subsequent encounter: Secondary | ICD-10-CM | POA: Diagnosis not present

## 2019-12-21 DIAGNOSIS — J440 Chronic obstructive pulmonary disease with acute lower respiratory infection: Secondary | ICD-10-CM | POA: Diagnosis not present

## 2019-12-21 DIAGNOSIS — S72001D Fracture of unspecified part of neck of right femur, subsequent encounter for closed fracture with routine healing: Secondary | ICD-10-CM | POA: Diagnosis not present

## 2019-12-21 DIAGNOSIS — A419 Sepsis, unspecified organism: Secondary | ICD-10-CM | POA: Diagnosis not present

## 2019-12-21 DIAGNOSIS — M9701XD Periprosthetic fracture around internal prosthetic right hip joint, subsequent encounter: Secondary | ICD-10-CM | POA: Diagnosis not present

## 2019-12-26 DIAGNOSIS — A419 Sepsis, unspecified organism: Secondary | ICD-10-CM | POA: Diagnosis not present

## 2019-12-26 DIAGNOSIS — M9701XD Periprosthetic fracture around internal prosthetic right hip joint, subsequent encounter: Secondary | ICD-10-CM | POA: Diagnosis not present

## 2019-12-26 DIAGNOSIS — S72001D Fracture of unspecified part of neck of right femur, subsequent encounter for closed fracture with routine healing: Secondary | ICD-10-CM | POA: Diagnosis not present

## 2019-12-26 DIAGNOSIS — J440 Chronic obstructive pulmonary disease with acute lower respiratory infection: Secondary | ICD-10-CM | POA: Diagnosis not present

## 2019-12-28 DIAGNOSIS — S72001D Fracture of unspecified part of neck of right femur, subsequent encounter for closed fracture with routine healing: Secondary | ICD-10-CM | POA: Diagnosis not present

## 2019-12-28 DIAGNOSIS — M9701XD Periprosthetic fracture around internal prosthetic right hip joint, subsequent encounter: Secondary | ICD-10-CM | POA: Diagnosis not present

## 2019-12-28 DIAGNOSIS — A419 Sepsis, unspecified organism: Secondary | ICD-10-CM | POA: Diagnosis not present

## 2019-12-28 DIAGNOSIS — J440 Chronic obstructive pulmonary disease with acute lower respiratory infection: Secondary | ICD-10-CM | POA: Diagnosis not present

## 2019-12-29 ENCOUNTER — Telehealth: Payer: Self-pay

## 2019-12-29 NOTE — Telephone Encounter (Signed)
Don called from [place] and asked to continue OT orders for patient for at least 3 weeks. I sent a teams message to Dr. Elease Hashimoto and he gave the verbal OK to continue orders. Don verbalized an understanding.

## 2019-12-29 NOTE — Telephone Encounter (Signed)
Error

## 2019-12-30 DIAGNOSIS — S72401A Unspecified fracture of lower end of right femur, initial encounter for closed fracture: Secondary | ICD-10-CM | POA: Diagnosis not present

## 2019-12-30 DIAGNOSIS — I5032 Chronic diastolic (congestive) heart failure: Secondary | ICD-10-CM | POA: Diagnosis not present

## 2019-12-30 DIAGNOSIS — M5127 Other intervertebral disc displacement, lumbosacral region: Secondary | ICD-10-CM | POA: Diagnosis not present

## 2019-12-30 DIAGNOSIS — R531 Weakness: Secondary | ICD-10-CM | POA: Diagnosis not present

## 2020-01-02 DIAGNOSIS — M9701XD Periprosthetic fracture around internal prosthetic right hip joint, subsequent encounter: Secondary | ICD-10-CM | POA: Diagnosis not present

## 2020-01-02 DIAGNOSIS — J449 Chronic obstructive pulmonary disease, unspecified: Secondary | ICD-10-CM | POA: Diagnosis not present

## 2020-01-02 DIAGNOSIS — S72001D Fracture of unspecified part of neck of right femur, subsequent encounter for closed fracture with routine healing: Secondary | ICD-10-CM | POA: Diagnosis not present

## 2020-01-02 DIAGNOSIS — M170 Bilateral primary osteoarthritis of knee: Secondary | ICD-10-CM | POA: Diagnosis not present

## 2020-01-02 DIAGNOSIS — M1611 Unilateral primary osteoarthritis, right hip: Secondary | ICD-10-CM | POA: Diagnosis not present

## 2020-01-04 DIAGNOSIS — J449 Chronic obstructive pulmonary disease, unspecified: Secondary | ICD-10-CM | POA: Diagnosis not present

## 2020-01-04 DIAGNOSIS — M1611 Unilateral primary osteoarthritis, right hip: Secondary | ICD-10-CM | POA: Diagnosis not present

## 2020-01-04 DIAGNOSIS — M170 Bilateral primary osteoarthritis of knee: Secondary | ICD-10-CM | POA: Diagnosis not present

## 2020-01-04 DIAGNOSIS — S72001D Fracture of unspecified part of neck of right femur, subsequent encounter for closed fracture with routine healing: Secondary | ICD-10-CM | POA: Diagnosis not present

## 2020-01-04 DIAGNOSIS — M9701XD Periprosthetic fracture around internal prosthetic right hip joint, subsequent encounter: Secondary | ICD-10-CM | POA: Diagnosis not present

## 2020-01-05 ENCOUNTER — Telehealth: Payer: Self-pay | Admitting: Family Medicine

## 2020-01-05 MED ORDER — HYDROCODONE-ACETAMINOPHEN 10-325 MG PO TABS: ORAL_TABLET | ORAL | 0 refills | Status: DC

## 2020-01-05 NOTE — Telephone Encounter (Signed)
Betty Jackson is calling in stating the the pt is needing a refill on her hydrocodone-acetaminophen ([place]) 10-325 MG  Pharm:  (Per Betty Jackson whatever pharmacy that is on file due to her driving and unable to get to get to her computer).  She also stated that the pt has used the medication more frequently than she should have due to R knee pain and will be going to the orthopaedic surgeon on Wednesday 01/10/2020.

## 2020-01-05 NOTE — Telephone Encounter (Signed)
Spoke with the pts daughter and she stated the pt has been taking more medication due to knee pain and she has an appt with the orthopedic on next Wednesday.  Message sent to PCP.

## 2020-01-05 NOTE — Telephone Encounter (Signed)
Betty Jackson, her daughter, usually monitors this.  Can we confirm with Betty Jackson if she is taking more frequently.  Recommend  not escalating the Hydrocodone since she is on Fentanyl patch.

## 2020-01-10 DIAGNOSIS — S72451D Displaced supracondylar fracture without intracondylar extension of lower end of right femur, subsequent encounter for closed fracture with routine healing: Secondary | ICD-10-CM | POA: Diagnosis not present

## 2020-01-10 DIAGNOSIS — M25561 Pain in right knee: Secondary | ICD-10-CM | POA: Diagnosis not present

## 2020-01-10 DIAGNOSIS — T84498D Other mechanical complication of other internal orthopedic devices, implants and grafts, subsequent encounter: Secondary | ICD-10-CM | POA: Diagnosis not present

## 2020-01-11 DIAGNOSIS — M170 Bilateral primary osteoarthritis of knee: Secondary | ICD-10-CM | POA: Diagnosis not present

## 2020-01-11 DIAGNOSIS — M1611 Unilateral primary osteoarthritis, right hip: Secondary | ICD-10-CM | POA: Diagnosis not present

## 2020-01-11 DIAGNOSIS — J449 Chronic obstructive pulmonary disease, unspecified: Secondary | ICD-10-CM | POA: Diagnosis not present

## 2020-01-11 DIAGNOSIS — M9701XD Periprosthetic fracture around internal prosthetic right hip joint, subsequent encounter: Secondary | ICD-10-CM | POA: Diagnosis not present

## 2020-01-11 DIAGNOSIS — S72001D Fracture of unspecified part of neck of right femur, subsequent encounter for closed fracture with routine healing: Secondary | ICD-10-CM | POA: Diagnosis not present

## 2020-01-12 ENCOUNTER — Telehealth: Payer: Self-pay

## 2020-01-12 DIAGNOSIS — J449 Chronic obstructive pulmonary disease, unspecified: Secondary | ICD-10-CM | POA: Diagnosis not present

## 2020-01-12 DIAGNOSIS — M170 Bilateral primary osteoarthritis of knee: Secondary | ICD-10-CM | POA: Diagnosis not present

## 2020-01-12 DIAGNOSIS — M1611 Unilateral primary osteoarthritis, right hip: Secondary | ICD-10-CM | POA: Diagnosis not present

## 2020-01-12 DIAGNOSIS — S72001D Fracture of unspecified part of neck of right femur, subsequent encounter for closed fracture with routine healing: Secondary | ICD-10-CM | POA: Diagnosis not present

## 2020-01-12 DIAGNOSIS — M9701XD Periprosthetic fracture around internal prosthetic right hip joint, subsequent encounter: Secondary | ICD-10-CM | POA: Diagnosis not present

## 2020-01-12 NOTE — Telephone Encounter (Signed)
Increase Lasix to one bid for 3 days and then drop back to one daily.

## 2020-01-12 NOTE — Telephone Encounter (Signed)
Don from [facility] called to report weight gain for pt. Weighing currently 157lb. Pt is CHF. Previous weigh on 10/5 149lb. Received cortisone injection in the knee 2-3 days ago

## 2020-01-12 NOTE — Telephone Encounter (Signed)
Lm for [facility] regarding pt. Doctor has approved for lasix to be bid for 3 days, decreasing bk to once a day on the forth day. Per Don's vm, ok to leaved detailed message regarding pts

## 2020-01-16 DIAGNOSIS — M170 Bilateral primary osteoarthritis of knee: Secondary | ICD-10-CM | POA: Diagnosis not present

## 2020-01-16 DIAGNOSIS — S72001D Fracture of unspecified part of neck of right femur, subsequent encounter for closed fracture with routine healing: Secondary | ICD-10-CM | POA: Diagnosis not present

## 2020-01-16 DIAGNOSIS — M9701XD Periprosthetic fracture around internal prosthetic right hip joint, subsequent encounter: Secondary | ICD-10-CM | POA: Diagnosis not present

## 2020-01-16 DIAGNOSIS — J449 Chronic obstructive pulmonary disease, unspecified: Secondary | ICD-10-CM | POA: Diagnosis not present

## 2020-01-16 DIAGNOSIS — M1611 Unilateral primary osteoarthritis, right hip: Secondary | ICD-10-CM | POA: Diagnosis not present

## 2020-01-17 DIAGNOSIS — J449 Chronic obstructive pulmonary disease, unspecified: Secondary | ICD-10-CM | POA: Diagnosis not present

## 2020-01-17 DIAGNOSIS — S72001D Fracture of unspecified part of neck of right femur, subsequent encounter for closed fracture with routine healing: Secondary | ICD-10-CM | POA: Diagnosis not present

## 2020-01-17 DIAGNOSIS — M1611 Unilateral primary osteoarthritis, right hip: Secondary | ICD-10-CM | POA: Diagnosis not present

## 2020-01-17 DIAGNOSIS — M170 Bilateral primary osteoarthritis of knee: Secondary | ICD-10-CM | POA: Diagnosis not present

## 2020-01-17 DIAGNOSIS — M9701XD Periprosthetic fracture around internal prosthetic right hip joint, subsequent encounter: Secondary | ICD-10-CM | POA: Diagnosis not present

## 2020-01-18 DIAGNOSIS — S72001D Fracture of unspecified part of neck of right femur, subsequent encounter for closed fracture with routine healing: Secondary | ICD-10-CM | POA: Diagnosis not present

## 2020-01-18 DIAGNOSIS — M1611 Unilateral primary osteoarthritis, right hip: Secondary | ICD-10-CM | POA: Diagnosis not present

## 2020-01-18 DIAGNOSIS — J449 Chronic obstructive pulmonary disease, unspecified: Secondary | ICD-10-CM | POA: Diagnosis not present

## 2020-01-18 DIAGNOSIS — M170 Bilateral primary osteoarthritis of knee: Secondary | ICD-10-CM | POA: Diagnosis not present

## 2020-01-18 DIAGNOSIS — M9701XD Periprosthetic fracture around internal prosthetic right hip joint, subsequent encounter: Secondary | ICD-10-CM | POA: Diagnosis not present

## 2020-01-19 ENCOUNTER — Encounter: Payer: Self-pay | Admitting: Family Medicine

## 2020-01-19 ENCOUNTER — Other Ambulatory Visit: Payer: Self-pay

## 2020-01-19 MED ORDER — ALPRAZOLAM 0.5 MG PO TABS: 0.5000 mg | ORAL_TABLET | Freq: Every evening | ORAL | 0 refills | Status: DC | PRN

## 2020-01-19 NOTE — Telephone Encounter (Signed)
Refilled once, but would really try to avoid chronic use with her chronic opioid use

## 2020-01-19 NOTE — Telephone Encounter (Signed)
[facility] is requesting a refill this patient , pt is having trouble sleeping. Per Quenton Fetter ,RN

## 2020-01-22 ENCOUNTER — Other Ambulatory Visit: Payer: Self-pay

## 2020-01-22 MED ORDER — FENTANYL 12 MCG/HR TD PT72: 1.0000 | MEDICATED_PATCH | TRANSDERMAL | 0 refills | Status: DC

## 2020-01-22 NOTE — Telephone Encounter (Signed)
Can you refill this Rx in Dr. Erick Blinks absence? It was last filled on 12/12/19 and she will use her last patch today.

## 2020-01-23 DIAGNOSIS — M170 Bilateral primary osteoarthritis of knee: Secondary | ICD-10-CM | POA: Diagnosis not present

## 2020-01-23 DIAGNOSIS — M1611 Unilateral primary osteoarthritis, right hip: Secondary | ICD-10-CM | POA: Diagnosis not present

## 2020-01-23 DIAGNOSIS — S72001D Fracture of unspecified part of neck of right femur, subsequent encounter for closed fracture with routine healing: Secondary | ICD-10-CM | POA: Diagnosis not present

## 2020-01-23 DIAGNOSIS — M9701XD Periprosthetic fracture around internal prosthetic right hip joint, subsequent encounter: Secondary | ICD-10-CM | POA: Diagnosis not present

## 2020-01-23 DIAGNOSIS — J449 Chronic obstructive pulmonary disease, unspecified: Secondary | ICD-10-CM | POA: Diagnosis not present

## 2020-01-24 ENCOUNTER — Telehealth: Payer: Self-pay | Admitting: Cardiovascular Disease

## 2020-01-24 DIAGNOSIS — M170 Bilateral primary osteoarthritis of knee: Secondary | ICD-10-CM | POA: Diagnosis not present

## 2020-01-24 DIAGNOSIS — J449 Chronic obstructive pulmonary disease, unspecified: Secondary | ICD-10-CM | POA: Diagnosis not present

## 2020-01-24 DIAGNOSIS — S72001D Fracture of unspecified part of neck of right femur, subsequent encounter for closed fracture with routine healing: Secondary | ICD-10-CM | POA: Diagnosis not present

## 2020-01-24 DIAGNOSIS — M9701XD Periprosthetic fracture around internal prosthetic right hip joint, subsequent encounter: Secondary | ICD-10-CM | POA: Diagnosis not present

## 2020-01-24 DIAGNOSIS — M1611 Unilateral primary osteoarthritis, right hip: Secondary | ICD-10-CM | POA: Diagnosis not present

## 2020-01-24 NOTE — Telephone Encounter (Signed)
Spoke with daughter.  She reports the OT was calling to report her weight gain.   She does not have any swelling.  Daughter does not hear her wheezing.  Does not feel full in abdomen.  Patient reports "i'm fine, i've just been eating too much."    They try to get consistent daily weights but it is hard.  Will monitor for any new symptoms and will call back if they have any concerns.

## 2020-01-24 NOTE — Telephone Encounter (Addendum)
The callback number is for the occupational therapist.  I left him a message that I would reach out to the patient directly. Called preferred number (W) for patient. The VM is full.  Unable to leave a message.

## 2020-01-24 NOTE — Telephone Encounter (Signed)
Pt c/o swelling: STAT is pt has developed SOB within 24 hours  1) How much weight have you gained and in what time span? 5.4 lbs  2) If swelling, where is the swelling located? No swelling  3) Are you currently taking a fluid pill? Yes   4) Are you currently SOB? No  5) Do you have a log of your daily weights (if so, list)? Yes  6) Have you gained 3 pounds in a day or 5 pounds in a week? Yes  7) Have you traveled recently? No

## 2020-01-24 NOTE — Telephone Encounter (Signed)
Tommi Emery OT from [place] called and ask to speak with [facility] , he stated he was returning a call   [phone]

## 2020-01-25 MED ORDER — FUROSEMIDE 40 MG PO TABS: 40.0000 mg | ORAL_TABLET | Freq: Every day | ORAL | 1 refills | Status: DC

## 2020-01-30 DIAGNOSIS — M316 Other giant cell arteritis: Secondary | ICD-10-CM | POA: Diagnosis not present

## 2020-01-30 DIAGNOSIS — H0288B Meibomian gland dysfunction left eye, upper and lower eyelids: Secondary | ICD-10-CM | POA: Diagnosis not present

## 2020-01-30 DIAGNOSIS — J449 Chronic obstructive pulmonary disease, unspecified: Secondary | ICD-10-CM | POA: Diagnosis not present

## 2020-01-30 DIAGNOSIS — Z7952 Long term (current) use of systemic steroids: Secondary | ICD-10-CM | POA: Diagnosis not present

## 2020-01-30 DIAGNOSIS — M170 Bilateral primary osteoarthritis of knee: Secondary | ICD-10-CM | POA: Diagnosis not present

## 2020-01-30 DIAGNOSIS — I5032 Chronic diastolic (congestive) heart failure: Secondary | ICD-10-CM | POA: Diagnosis not present

## 2020-01-30 DIAGNOSIS — M5127 Other intervertebral disc displacement, lumbosacral region: Secondary | ICD-10-CM | POA: Diagnosis not present

## 2020-01-30 DIAGNOSIS — M1611 Unilateral primary osteoarthritis, right hip: Secondary | ICD-10-CM | POA: Diagnosis not present

## 2020-01-30 DIAGNOSIS — M9701XD Periprosthetic fracture around internal prosthetic right hip joint, subsequent encounter: Secondary | ICD-10-CM | POA: Diagnosis not present

## 2020-01-30 DIAGNOSIS — Z961 Presence of intraocular lens: Secondary | ICD-10-CM | POA: Diagnosis not present

## 2020-01-30 DIAGNOSIS — S72001D Fracture of unspecified part of neck of right femur, subsequent encounter for closed fracture with routine healing: Secondary | ICD-10-CM | POA: Diagnosis not present

## 2020-01-30 DIAGNOSIS — R531 Weakness: Secondary | ICD-10-CM | POA: Diagnosis not present

## 2020-01-30 DIAGNOSIS — S72401A Unspecified fracture of lower end of right femur, initial encounter for closed fracture: Secondary | ICD-10-CM | POA: Diagnosis not present

## 2020-02-08 ENCOUNTER — Other Ambulatory Visit: Payer: Self-pay | Admitting: Family Medicine

## 2020-02-08 ENCOUNTER — Encounter: Payer: Self-pay | Admitting: Family Medicine

## 2020-02-09 MED ORDER — ESCITALOPRAM OXALATE 10 MG PO TABS: 10.0000 mg | ORAL_TABLET | Freq: Every day | ORAL | 1 refills | Status: DC

## 2020-02-09 MED ORDER — HYDROCODONE-ACETAMINOPHEN 10-325 MG PO TABS: ORAL_TABLET | ORAL | 0 refills | Status: DC

## 2020-02-09 NOTE — Telephone Encounter (Signed)
Called requesting refills of Lexapro and hydrocodone which she takes for breakthrough pain.  Both are sent

## 2020-02-12 NOTE — Telephone Encounter (Signed)
Ferrous sulfate is OTC

## 2020-02-13 DIAGNOSIS — J449 Chronic obstructive pulmonary disease, unspecified: Secondary | ICD-10-CM | POA: Diagnosis not present

## 2020-02-13 DIAGNOSIS — M170 Bilateral primary osteoarthritis of knee: Secondary | ICD-10-CM | POA: Diagnosis not present

## 2020-02-13 DIAGNOSIS — M1611 Unilateral primary osteoarthritis, right hip: Secondary | ICD-10-CM | POA: Diagnosis not present

## 2020-02-13 DIAGNOSIS — M9701XD Periprosthetic fracture around internal prosthetic right hip joint, subsequent encounter: Secondary | ICD-10-CM | POA: Diagnosis not present

## 2020-02-13 DIAGNOSIS — S72001D Fracture of unspecified part of neck of right femur, subsequent encounter for closed fracture with routine healing: Secondary | ICD-10-CM | POA: Diagnosis not present

## 2020-02-20 ENCOUNTER — Encounter: Payer: Self-pay | Admitting: Family Medicine

## 2020-02-20 ENCOUNTER — Other Ambulatory Visit: Payer: Self-pay

## 2020-02-20 ENCOUNTER — Telehealth (INDEPENDENT_AMBULATORY_CARE_PROVIDER_SITE_OTHER): Payer: Medicare Other | Admitting: Family Medicine

## 2020-02-20 VITALS — Wt 152.0 lb

## 2020-02-20 DIAGNOSIS — R1110 Vomiting, unspecified: Secondary | ICD-10-CM | POA: Diagnosis not present

## 2020-02-20 DIAGNOSIS — R197 Diarrhea, unspecified: Secondary | ICD-10-CM | POA: Diagnosis not present

## 2020-02-20 NOTE — Progress Notes (Signed)
Patient ID: Betty Jackson, female   DOB: 12-08-1934, 85 y.o.   MRN: [id number]  This visit type was conducted due to national recommendations for restrictions regarding the COVID-19 pandemic in an effort to limit this patient's exposure and mitigate transmission in our community.   Virtual Visit via Telephone Note  I connected with Betty Jackson on 02/20/20 at  9:00 AM EST by telephone and verified that I am speaking with the correct person using two identifiers.   I discussed the limitations, risks, security and privacy concerns of performing an evaluation and management service by telephone and the availability of in person appointments. I also discussed with the patient that there may be a patient responsible charge related to this service. The patient expressed understanding and agreed to proceed.  Location patient: home Location provider: work or home office Participants present for the call: patient, provider, and patient's daughter Betty Jackson. Patient did not have a visit in the prior 7 days to address this/these issue(s).   History of Present Illness: [name] has complicated past medical history with history of CAD, congestive heart failure, history of temporal arteritis, atrial fibrillation, hypertension, severe osteoarthritis involving multiple joints, chronic kidney disease, chronic low back pain.  She has caregivers 24 hours/day.  Some of them recently have been mentioned that she has had some intermittent vomiting.  She has apparently had this for many months.  Symptoms are very intermittent.  Sometimes after eating.  No consistent early satiety symptoms.  She also has some intermittent diarrhea which apparently again is very transient.  She has had remote history of C. difficile.  She is not describing several stools per day.  No recent fever.  No confusion.  Good appetite.  Weight stable.  No abdominal pain.  They have not noted any clear correlation with her vomiting and diarrhea symptoms  with any specific foods or medications.  She is on chronic pain management with Duragesic patch and supplements with hydrocodone.  They have not seen a clear correlation with the hydrocodone.  Past Medical History:  Diagnosis Date  . Acute respiratory failure with hypoxia ([place]) 05/20/2017  . Anemia, unspecified 10/28/2012  . Anxiety state, unspecified 10/28/2012  . CAD (coronary artery disease)    a. Stent RCA in [facility];  b. Cath approx 2009 - nonobs per pt report.  . Cataract    immature on the left eye  . Chronic insomnia 02/07/2013  . Chronic lower back pain    scoliosis  . CKD (chronic kidney disease) stage 3, GFR 30-59 ml/min (HCC)   . CVA (cerebral infarction) 10/29/2012  . DDD (degenerative disc disease)   . Depression   . Diverticulosis   . GERD (gastroesophageal reflux disease) 09/01/2010  . Hemorrhoids   . Herniated nucleus pulposus, L5-S1, right 11/04/2015  . Hyperlipidemia    takes Lipitor daily  . Hypertension    takes Amlodipine,Losartan,Metoprolol,and HCTZ daily  . Incontinence of urine   . Insomnia    takes Restoril nightly  . Lumbar stenosis 04/25/2013  . Major depressive disorder, recurrent episode, moderate ([place]) 07/16/2013  . Osteoporosis   . PAF (paroxysmal atrial fibrillation) ([place]) 2011   a. lone epidode in 2011 according to notes.  . Rheumatoid arthritis ([place])   . Scoliosis   . Sepsis ([place]) 05/2019  . Temporal arteritis ([place]) 2011   a. followed @ [facility]; potential flareup 10/28/2012/notes 10/28/2012  . Vocal cord dysfunction    "they don't operate properly" (10/28/2012)   Past Surgical History:  Procedure Laterality Date  . ABDOMINAL HYSTERECTOMY  ~ 1984   vaginally  . BACK SURGERY  7-73yrs ago   X Stop  . BLADDER SUSPENSION  2001  . BREAST BIOPSY Right   . CATARACT EXTRACTION W/ INTRAOCULAR LENS IMPLANT Right ~ 08/2012  . CHEST TUBE INSERTION Left 03/08/2019   Procedure: Chest Tube Insertion;  Surgeon: Ivin Poot, MD;  Location: [place];  Service:  Thoracic;  Laterality: Left;  . COLONOSCOPY  01/26/2012   Procedure: COLONOSCOPY;  Surgeon: Ladene Artist, MD,FACG;  Location: [facility] ENDOSCOPY;  Service: Endoscopy;  Laterality: N/A;  note the EGD is possible  . CORONARY ANGIOPLASTY WITH STENT PLACEMENT  2006   X 1 stent  . EPIDURAL BLOCK INJECTION    . ESOPHAGOGASTRODUODENOSCOPY  01/26/2012   Procedure: ESOPHAGOGASTRODUODENOSCOPY (EGD);  Surgeon: Ladene Artist, MD,FACG;  Location: [facility] ENDOSCOPY;  Service: Endoscopy;  Laterality: N/A;  . FINE NEEDLE ASPIRATION Right 09/26/2019   Procedure: FINE NEEDLE ASPIRATION;  Surgeon: Altamese [place], MD;  Location: [place];  Service: Orthopedics;  Laterality: Right;  . HEMIARTHROPLASTY HIP Right 2012  . IR EPIDUROGRAPHY  04/20/2019  . LAPAROSCOPIC CHOLECYSTECTOMY  2001  . LUMBAR FUSION  03/2013  . LUMBAR LAMINECTOMY/DECOMPRESSION MICRODISCECTOMY Right 11/04/2015   Procedure: Right Lumbar Five-Sacral One Microdiskectomy;  Surgeon: Kristeen Miss, MD;  Location: [place] NEURO ORS;  Service: Neurosurgery;  Laterality: Right;  Right L5-S1 Microdiskectomy  . moles removed that required stiches     one on leg and one on face  . ORIF FEMUR FRACTURE Right 09/26/2019   Procedure: OPEN REDUCTION INTERNAL FIXATION (ORIF) DISTAL FEMUR FRACTURE;  Surgeon: Altamese Burnet, MD;  Location: [place];  Service: Orthopedics;  Laterality: Right;  . ORIF FEMUR FRACTURE Right 10/17/2019   Procedure: OPEN REDUCTION INTERNAL FIXATION (ORIF) DISTAL FEMUR FRACTURE;  Surgeon: Altamese [place], MD;  Location: [place];  Service: Orthopedics;  Laterality: Right;  . SUBXYPHOID PERICARDIAL WINDOW N/A 03/08/2019   Procedure: SUBXYPHOID PERICARDIAL WINDOW;  Surgeon: Ivin Poot, MD;  Location: [place];  Service: Thoracic;  Laterality: N/A;  . TEE WITHOUT CARDIOVERSION N/A 03/08/2019   Procedure: TRANSESOPHAGEAL ECHOCARDIOGRAM (TEE);  Surgeon: Prescott Gum, Collier [place]ina, MD;  Location: [place];  Service: Thoracic;  Laterality: N/A;  . TEMPORAL ARTERY BIOPSY / LIGATION  Bilateral 2011  . TONSILLECTOMY AND ADENOIDECTOMY     at age 48  . TUBAL LIGATION  ~ 1982  . X-STOP IMPLANTATION  ~ 2010   "lower back" (10/28/2012)    reports that she has never smoked. She has never used smokeless tobacco. She reports current alcohol use. She reports that she does not use drugs. family history includes Brain cancer (age of onset: 43) in her mother; Breast cancer (age of onset: 33) in her daughter; Heart attack in her father; Heart disease in an other family member; Hypertension in an other family member. Allergies  Allergen Reactions  . Dextromethorphan Rash      Observations/Objective: Patient sounds cheerful and well on the phone. I do not appreciate any SOB. Speech and thought processing are grossly intact. Patient reported vitals:  Assessment and Plan:  #1 intermittent vomiting.  This apparently is been chronic and also infrequent.  They have not seen a clear correlation with medications.  We discussed the nonspecific nature of this and that we could engage in further work-up.  At this point her daughter thinks this is very infrequent and is comfortable with observing  -We suggested they keep the diary and log of her  episodes of both diarrhea and vomiting to see if any correlation with foods or other items. -Watch for any abdominal pain, fever, confusion, abdominal distention -If increasing in frequency consider further lab evaluation and consider possible gastric emptying study  #2 intermittent diarrhea.  She has had C. difficile in the past but her current symptoms are very infrequent and she is not describing several stools per day.  This sounds very nonspecific.  -Consider further evaluation as above for any persistent symptoms - will keep a log of her episodes  Follow Up Instructions:  -As above   99441 5-10 99442 11-20 99443 21-30 I did not refer this patient for an OV in the next 24 hours for this/these issue(s).  I discussed the assessment and  treatment plan with the patient. The patient was provided an opportunity to ask questions and all were answered. The patient agreed with the plan and demonstrated an understanding of the instructions.   The patient was advised to call back or seek an in-person evaluation if the symptoms worsen or if the condition fails to improve as anticipated.  I provided 25 minutes of non-face-to-face time during this encounter.   Carolann Littler, MD

## 2020-02-21 ENCOUNTER — Encounter: Payer: Self-pay | Admitting: Family Medicine

## 2020-02-21 ENCOUNTER — Telehealth: Payer: Self-pay | Admitting: Family Medicine

## 2020-02-21 DIAGNOSIS — R112 Nausea with vomiting, unspecified: Secondary | ICD-10-CM | POA: Diagnosis not present

## 2020-02-21 DIAGNOSIS — K922 Gastrointestinal hemorrhage, unspecified: Secondary | ICD-10-CM | POA: Diagnosis not present

## 2020-02-21 DIAGNOSIS — R1110 Vomiting, unspecified: Secondary | ICD-10-CM | POA: Diagnosis not present

## 2020-02-21 DIAGNOSIS — K625 Hemorrhage of anus and rectum: Secondary | ICD-10-CM | POA: Diagnosis not present

## 2020-02-21 NOTE — Telephone Encounter (Signed)
Verbal orders given.  Betty Jackson will draw CBC with and CMP.

## 2020-02-21 NOTE — Telephone Encounter (Signed)
[place] Program with [facility] [phone]  She needs verbal orders to draw labs wile at the patients home today and also wanted to know if it will be okay for the patient to take Imodium for Diarrhea and vomiting.  Please advise

## 2020-02-21 NOTE — Telephone Encounter (Signed)
Okay to give verbal orders for labs.  She can take over-the-counter Imodium as long as she is not having persistent stools in the range of 6 to 8/day.  She was not having this type of diarrhea when I spoke with daughter earlier.  She has had C. difficile in the past and she starting to have frequent high-volume diarrhea such as 6 to 8/day would recommend further testing to rule out C. difficile

## 2020-02-29 ENCOUNTER — Telehealth: Payer: Self-pay | Admitting: Family Medicine

## 2020-02-29 DIAGNOSIS — R531 Weakness: Secondary | ICD-10-CM | POA: Diagnosis not present

## 2020-02-29 DIAGNOSIS — S72401A Unspecified fracture of lower end of right femur, initial encounter for closed fracture: Secondary | ICD-10-CM | POA: Diagnosis not present

## 2020-02-29 DIAGNOSIS — I5032 Chronic diastolic (congestive) heart failure: Secondary | ICD-10-CM | POA: Diagnosis not present

## 2020-02-29 DIAGNOSIS — M5127 Other intervertebral disc displacement, lumbosacral region: Secondary | ICD-10-CM | POA: Diagnosis not present

## 2020-02-29 MED ORDER — FENTANYL 12 MCG/HR TD PT72: 1.0000 | MEDICATED_PATCH | TRANSDERMAL | 0 refills | Status: DC

## 2020-02-29 NOTE — Telephone Encounter (Signed)
Pt daughter called stating pt is out of medicine and need refill for fentaNYL ([place]) 12 MCG/HR [[id number]]  [id number] [address] [facility], [place] [address]  Phone:  [phone] Fax:  [phone]

## 2020-03-01 ENCOUNTER — Telehealth: Payer: Self-pay

## 2020-03-01 MED ORDER — FENTANYL 12 MCG/HR TD PT72: 1.0000 | MEDICATED_PATCH | TRANSDERMAL | 0 refills | Status: DC

## 2020-03-01 MED ORDER — HYDROCODONE-ACETAMINOPHEN 10-325 MG PO TABS: ORAL_TABLET | ORAL | 0 refills | Status: DC

## 2020-03-01 NOTE — Telephone Encounter (Signed)
[facility] [address] [facility]'t [facility] and they can't [facility] to another [facility].  She would like it sent to  [id number] [place], [place] Phone:  [phone]  Fax:  [phone]

## 2020-03-01 NOTE — Telephone Encounter (Signed)
Spoke with daughter. She is aware provider [facility] [address] [facility] [employer].

## 2020-03-01 NOTE — Telephone Encounter (Signed)
Please send patch to new requested pharmacy. Preferred pharmacy will not have until Monday and patient can't wait that long to get patch. Thank you

## 2020-03-01 NOTE — Telephone Encounter (Signed)
Pt called back fentaNYL (DURAGESIC) 12 MCG/HR, the pharmacy she currently uses, does not have the patches she would like to use at [facility] [address]. [place], [place] [address]  She is also requesting a refill on her HYDROcodone-acetaminophen (NORCO) 10-325 MG tablet

## 2020-03-01 NOTE — Addendum Note (Signed)
Addended by: Eulas Post on: 03/01/2020 10:51 AM   Modules accepted: Orders

## 2020-03-06 DIAGNOSIS — M25551 Pain in right hip: Secondary | ICD-10-CM | POA: Diagnosis not present

## 2020-03-06 DIAGNOSIS — T84498D Other mechanical complication of other internal orthopedic devices, implants and grafts, subsequent encounter: Secondary | ICD-10-CM | POA: Diagnosis not present

## 2020-03-06 DIAGNOSIS — S72451D Displaced supracondylar fracture without intracondylar extension of lower end of right femur, subsequent encounter for closed fracture with routine healing: Secondary | ICD-10-CM | POA: Diagnosis not present

## 2020-03-06 DIAGNOSIS — S72431D Displaced fracture of medial condyle of right femur, subsequent encounter for closed fracture with routine healing: Secondary | ICD-10-CM | POA: Diagnosis not present

## 2020-03-14 ENCOUNTER — Telehealth: Payer: Self-pay | Admitting: Family Medicine

## 2020-03-14 NOTE — Progress Notes (Signed)
°  Chronic Care Management   Outreach Note  03/14/2020 Name: Betty Jackson MRN: [id number] DOB: 01-05-1935  Referred by: Eulas Post, MD Reason for referral : No chief complaint on file.   An unsuccessful telephone outreach was attempted today. The patient was referred to the pharmacist for assistance with care management and care coordination.   Follow Up Plan:   Carley Perdue [employer] Scheduler

## 2020-03-19 ENCOUNTER — Telehealth: Payer: Self-pay | Admitting: Family Medicine

## 2020-03-19 NOTE — Progress Notes (Signed)
  Chronic Care Management   Outreach Note  03/19/2020 Name: SAFIYA GIRDLER MRN: [id number] DOB: [birth year]  Referred by: Eulas Post, MD Reason for referral : No chief complaint on file.   A second unsuccessful telephone outreach was attempted today. The patient was referred to pharmacist for assistance with care management and care coordination.  Follow Up Plan:   Carley Perdue [employer] Scheduler

## 2020-03-20 ENCOUNTER — Ambulatory Visit (INDEPENDENT_AMBULATORY_CARE_PROVIDER_SITE_OTHER): Payer: Medicare Other | Admitting: Family Medicine

## 2020-03-20 ENCOUNTER — Encounter: Payer: Self-pay | Admitting: Family Medicine

## 2020-03-20 ENCOUNTER — Other Ambulatory Visit: Payer: Self-pay

## 2020-03-20 VITALS — BP 118/66 | HR 79 | Temp 98.0°F | Ht 64.0 in | Wt 157.4 lb

## 2020-03-20 DIAGNOSIS — M545 Low back pain, unspecified: Secondary | ICD-10-CM

## 2020-03-20 DIAGNOSIS — K921 Melena: Secondary | ICD-10-CM

## 2020-03-20 DIAGNOSIS — I10 Essential (primary) hypertension: Secondary | ICD-10-CM

## 2020-03-20 DIAGNOSIS — R1110 Vomiting, unspecified: Secondary | ICD-10-CM | POA: Diagnosis not present

## 2020-03-20 DIAGNOSIS — G8929 Other chronic pain: Secondary | ICD-10-CM | POA: Diagnosis not present

## 2020-03-20 NOTE — Progress Notes (Signed)
Established Patient Office Visit  Subjective:  Patient ID: Betty Jackson, female    DOB: 11-26-1934  Age: 85 y.o. MRN: [id number]  CC:  Chief Complaint  Patient presents with  . Rectal Bleeding    C/O blood in stool x few months, also vomiting after most meals.     HPI Betty Jackson presents for evaluation of issues as below.  She is accompanied by her daughter, Betty Jackson.  She has been seen by palliative care.  She had brought up the issue that she has had some postprandial vomiting frequently several times per week recently which prompted call to our office yesterday.  I tried to contact patient's daughter yesterday to no avail.  Betty Jackson has multiple chronic problems including history of atrial fibrillation, CAD, history of congestive heart failure, history of giant cell arteritis, hypertension, history of acute diverticulitis, GERD, chronic kidney disease stage III, chronic back pain treated with low-dose Duragesic patch.  She had multiple back surgeries.  She is maintained on Eliquis twice daily.  She relates about 3-4 times per week having vomiting after meals.  This does not seem to occur any specific time of day but does usually occur about 30 minutes after eating.  She does not have any chronic nausea.  No significant abdominal pain.  She had previous cholecystectomy.  Previously battled some constipation a few months ago but none recently.  Denies any recent melena.  Does not seem related to any specific foods.  Weight is actually up about 6 pounds from September.  Second issue is she has had some relatively bright red blood frequently in stools.  No recent straining.  She is on Eliquis as above.  Denies any perianal pain or any pain with bowel movements.  She had brought up the issue of occasional blood in stools back in September but at that point had decided against any further assessment or intervention.  She has had prior colonoscopies prior to moving here but is not sure of exact  dates.  Her weight is actually up 6 pounds from September.  Her appetite is stable.  Past Medical History:  Diagnosis Date  . Acute respiratory failure with hypoxia (HCC) 05/20/2017  . Anemia, unspecified 10/28/2012  . Anxiety state, unspecified 10/28/2012  . CAD (coronary artery disease)    a. Stent RCA in [facility];  b. Cath approx 2009 - nonobs per pt report.  . Cataract    immature on the left eye  . Chronic insomnia 02/07/2013  . Chronic lower back pain    scoliosis  . CKD (chronic kidney disease) stage 3, GFR 30-59 ml/min (HCC)   . CVA (cerebral infarction) 10/29/2012  . DDD (degenerative disc disease)   . Depression   . Diverticulosis   . GERD (gastroesophageal reflux disease) 09/01/2010  . Hemorrhoids   . Herniated nucleus pulposus, L5-S1, right 11/04/2015  . Hyperlipidemia    takes Lipitor daily  . Hypertension    takes Amlodipine,Losartan,Metoprolol,and HCTZ daily  . Incontinence of urine   . Insomnia    takes Restoril nightly  . Lumbar stenosis 04/25/2013  . Major depressive disorder, recurrent episode, moderate (HCC) 07/16/2013  . Osteoporosis   . PAF (paroxysmal atrial fibrillation) (HCC) 2011   a. lone epidode in 2011 according to notes.  . Rheumatoid arthritis (HCC)   . Scoliosis   . Sepsis (HCC) 05/2019  . Temporal arteritis (HCC) 2011   a. followed @ [facility]; potential flareup 10/28/2012/notes 10/28/2012  . Vocal cord  dysfunction    "they don't operate properly" (10/28/2012)    Past Surgical History:  Procedure Laterality Date  . ABDOMINAL HYSTERECTOMY  ~ 1984   vaginally  . BACK SURGERY  7-21yrs ago   X Stop  . BLADDER SUSPENSION  2001  . BREAST BIOPSY Right   . CATARACT EXTRACTION W/ INTRAOCULAR LENS IMPLANT Right ~ 08/2012  . CHEST TUBE INSERTION Left 03/08/2019   Procedure: Chest Tube Insertion;  Surgeon: Ivin Poot, MD;  Location: [place];  Service: Thoracic;  Laterality: Left;  . COLONOSCOPY  01/26/2012   Procedure: COLONOSCOPY;  Surgeon: Ladene Artist, MD,FACG;  Location: [facility] ENDOSCOPY;  Service: Endoscopy;  Laterality: N/A;  note the EGD is possible  . CORONARY ANGIOPLASTY WITH STENT PLACEMENT  2006   X 1 stent  . EPIDURAL BLOCK INJECTION    . ESOPHAGOGASTRODUODENOSCOPY  01/26/2012   Procedure: ESOPHAGOGASTRODUODENOSCOPY (EGD);  Surgeon: Ladene Artist, MD,FACG;  Location: [facility] ENDOSCOPY;  Service: Endoscopy;  Laterality: N/A;  . FINE NEEDLE ASPIRATION Right 09/26/2019   Procedure: FINE NEEDLE ASPIRATION;  Surgeon: Altamese Jeffers, MD;  Location: [place];  Service: Orthopedics;  Laterality: Right;  . HEMIARTHROPLASTY HIP Right 2012  . IR EPIDUROGRAPHY  04/20/2019  . LAPAROSCOPIC CHOLECYSTECTOMY  2001  . LUMBAR FUSION  03/2013  . LUMBAR LAMINECTOMY/DECOMPRESSION MICRODISCECTOMY Right 11/04/2015   Procedure: Right Lumbar Five-Sacral One Microdiskectomy;  Surgeon: Kristeen Miss, MD;  Location: [place] NEURO ORS;  Service: Neurosurgery;  Laterality: Right;  Right L5-S1 Microdiskectomy  . moles removed that required stiches     one on leg and one on face  . ORIF FEMUR FRACTURE Right 09/26/2019   Procedure: OPEN REDUCTION INTERNAL FIXATION (ORIF) DISTAL FEMUR FRACTURE;  Surgeon: Altamese Highlands, MD;  Location: [place];  Service: Orthopedics;  Laterality: Right;  . ORIF FEMUR FRACTURE Right 10/17/2019   Procedure: OPEN REDUCTION INTERNAL FIXATION (ORIF) DISTAL FEMUR FRACTURE;  Surgeon: Altamese Dillingham, MD;  Location: [place];  Service: Orthopedics;  Laterality: Right;  . SUBXYPHOID PERICARDIAL WINDOW N/A 03/08/2019   Procedure: SUBXYPHOID PERICARDIAL WINDOW;  Surgeon: Ivin Poot, MD;  Location: [place];  Service: Thoracic;  Laterality: N/A;  . TEE WITHOUT CARDIOVERSION N/A 03/08/2019   Procedure: TRANSESOPHAGEAL ECHOCARDIOGRAM (TEE);  Surgeon: Prescott Gum, Collier Salina, MD;  Location: [place];  Service: Thoracic;  Laterality: N/A;  . TEMPORAL ARTERY BIOPSY / LIGATION Bilateral 2011  . TONSILLECTOMY AND ADENOIDECTOMY     at age 57  . TUBAL LIGATION  ~ 1982  . X-STOP  IMPLANTATION  ~ 2010   "lower back" (10/28/2012)    Family History  Problem Relation Age of Onset  . Brain cancer Mother 51  . Heart attack Father   . Breast cancer Daughter 33  . Heart disease Other   . Hypertension Other   . Arthritis Neg Hx   . Colon cancer Neg Hx   . Osteoporosis Neg Hx     Social History   Socioeconomic History  . Marital status: Married    Spouse name: Not on file  . Number of children: 3  . Years of education: Not on file  . Highest education level: Not on file  Occupational History  . Occupation: Retired Cabin crew  Tobacco Use  . Smoking status: Never Smoker  . Smokeless tobacco: Never Used  Vaping Use  . Vaping Use: Never used  Substance and Sexual Activity  . Alcohol use: Yes    Comment: socially  . Drug use: No  . Sexual activity:  Not on file  Other Topics Concern  . Not on file  Social History Narrative   Lives in [place] with her husband.  Retired Cabin crew.   Social Determinants of Health   Financial Resource Strain: Not on file  Food Insecurity: Not on file  Transportation Needs: Not on file  Physical Activity: Not on file  Stress: Not on file  Social Connections: Not on file  Intimate Partner Violence: Not on file    Outpatient Medications Prior to Visit  Medication Sig Dispense Refill  . apixaban (ELIQUIS) 5 MG TABS tablet Take 5 mg by mouth 2 (two) times daily.    . cholecalciferol (VITAMIN D) 25 MCG tablet Take 2 tablets (2,000 Units total) by mouth 2 (two) times daily.    Marland Kitchen diltiazem (CARDIZEM CD) 240 MG 24 hr capsule Take 1 capsule (240 mg total) by mouth daily. 90 capsule 3  . escitalopram (LEXAPRO) 10 MG tablet Take 1 tablet (10 mg total) by mouth daily. 90 tablet 1  . famotidine (PEPCID) 20 MG tablet Take 1 tablet (20 mg total) by mouth daily. 30 tablet 1  . fentaNYL (DURAGESIC) 12 MCG/HR Place 1 patch onto the skin every 3 (three) days. 10 patch 0  . furosemide (LASIX) 40 MG tablet Take 1 tablet (40 mg total) by mouth  daily. 90 tablet 1  . HYDROcodone-acetaminophen (NORCO) 10-325 MG tablet Take one tablet by mouth every 8 hours as needed for breakthrough pain. 30 tablet 0  . LACTOBACILLUS PO Take 2 capsules by mouth 3 (three) times daily.    . metoprolol tartrate (LOPRESSOR) 50 MG tablet Take 1 tablet (50 mg total) by mouth 2 (two) times daily. 30 tablet 0  . Multiple Vitamin (MULTIVITAMIN WITH MINERALS) TABS tablet Take 1 tablet by mouth daily. 60 tablet 0  . potassium chloride (KLOR-CON) 10 MEQ tablet Take 1 tablet (10 mEq total) by mouth daily. 90 tablet 2  . predniSONE (DELTASONE) 10 MG tablet Take 1 tablet (10 mg total) by mouth daily with breakfast. 30 tablet 0  . vitamin C (ASCORBIC ACID) 500 MG tablet Take 1,000 mg by mouth daily.    . vitamin E 180 MG (400 UNITS) capsule Take 400 Units by mouth daily.    Marland Kitchen ALPRAZolam (XANAX) 0.5 MG tablet Take 1 tablet (0.5 mg total) by mouth at bedtime as needed for sleep. (Patient not taking: Reported on 03/20/2020) 7 tablet 0  . ferrous sulfate 325 (65 FE) MG tablet Take 1 tablet (325 mg total) by mouth 2 (two) times daily with a meal. (Patient not taking: Reported on 03/20/2020) 60 tablet 3  . senna-docusate (SENOKOT-S) 8.6-50 MG tablet Take 1 tablet by mouth at bedtime. (Patient not taking: Reported on 03/20/2020)     No facility-administered medications prior to visit.    Allergies  Allergen Reactions  . Dextromethorphan Rash    ROS Review of Systems  Constitutional: Negative for appetite change, chills, fever and unexpected weight change.  Respiratory: Negative for shortness of breath.   Cardiovascular: Negative for chest pain.  Gastrointestinal: Positive for blood in stool and vomiting. Negative for abdominal pain.  Genitourinary: Negative for dysuria.  Neurological: Negative for dizziness.  Psychiatric/Behavioral: Negative for confusion.      Objective:    Physical Exam Vitals reviewed.  Constitutional:      Appearance: Normal appearance.   Cardiovascular:     Rate and Rhythm: Normal rate.  Pulmonary:     Effort: Pulmonary effort is normal.     Breath  sounds: Normal breath sounds.  Abdominal:     General: There is no distension.     Palpations: Abdomen is soft.     Tenderness: There is no abdominal tenderness.  Musculoskeletal:     Right lower leg: No edema.     Left lower leg: No edema.  Neurological:     General: No focal deficit present.     Mental Status: She is alert.  Psychiatric:        Mood and Affect: Mood normal.        Thought Content: Thought content normal.     BP 118/66   Pulse 79   Temp 98 F (36.7 C) (Oral)   Ht 5\' 4"  (1.626 m)   Wt 157 lb 6.4 oz (71.4 kg)   SpO2 95%   BMI 27.02 kg/m  Wt Readings from Last 3 Encounters:  03/20/20 157 lb 6.4 oz (71.4 kg)  02/20/20 152 lb (68.9 kg)  12/12/19 151 lb (68.5 kg)     Health Maintenance Due  Topic Date Due  . FOOT EXAM  Never done  . OPHTHALMOLOGY EXAM  Never done  . URINE MICROALBUMIN  Never done  . COVID-19 Vaccine (1) Never done  . DEXA SCAN  Never done  . PNA vac Low Risk Adult (2 of 2 - PPSV23) 04/02/2009  . HEMOGLOBIN A1C  09/05/2019    There are no preventive care reminders to display for this patient.  Lab Results  Component Value Date   TSH 3.085 03/06/2019   Lab Results  Component Value Date   WBC 14.3 (H) 12/12/2019   HGB 12.0 12/12/2019   HCT 36.5 12/12/2019   MCV 91.3 12/12/2019   PLT 231 12/12/2019   Lab Results  Component Value Date   NA 139 12/12/2019   K 3.7 12/12/2019   CO2 27 12/12/2019   GLUCOSE 127 (H) 12/12/2019   BUN 13 12/12/2019   CREATININE 0.75 12/12/2019   BILITOT 1.6 (H) 10/26/2019   ALKPHOS 121 10/26/2019   AST 17 10/26/2019   ALT 16 10/26/2019   PROT 5.1 (L) 10/26/2019   ALBUMIN 2.1 (L) 10/26/2019   CALCIUM 9.3 12/12/2019   ANIONGAP 11 10/30/2019   GFR 52.36 (L) 06/09/2019   Lab Results  Component Value Date   CHOL 171 11/14/2018   Lab Results  Component Value Date   HDL 63.40  11/14/2018   Lab Results  Component Value Date   LDLCALC 74 11/14/2018   Lab Results  Component Value Date   TRIG 168.0 (H) 11/14/2018   Lab Results  Component Value Date   CHOLHDL 3 11/14/2018   Lab Results  Component Value Date   HGBA1C 5.9 (H) 03/07/2019      Assessment & Plan:   #1 intermittent episodes of postprandial vomiting.  Patient symptoms are very inconsistent.  Doubt gastroparesis.  She denies any red flags such as hematemesis or any weight loss and her appetite is stable.  -We discussed further evaluation with either upper GI versus GI referral and she would like to proceed with the latter. -check BMP and CBC.   #2 reported intermittent bright red blood per rectum.  This has occurred on several occasions over at least a few months.  Patient is on Eliquis.  -She is obviously higher risk (for endoscopy) because of her age and multiple comorbidities but she would like to see GI to discuss possible further evaluation.  -Recheck CBC to assess stability of hemoglobin -referral to GI placed  #3  chronic back pain.  Currently stable on Duragesic.  She supplements with hydrocodone usually once per day  -continue with current pain medications.  No orders of the defined types were placed in this encounter.   Follow-up: No follow-ups on file.    Carolann Littler, MD

## 2020-03-21 LAB — BASIC METABOLIC PANEL
BUN/Creatinine Ratio: 20 (calc) (ref 6–22)
BUN: 19 mg/dL (ref 7–25)
CO2: 27 mmol/L (ref 20–32)
Calcium: 8.8 mg/dL (ref 8.6–10.4)
Chloride: 103 mmol/L (ref 98–110)
Creat: 0.97 mg/dL — ABNORMAL HIGH (ref 0.60–0.88)
Glucose, Bld: 117 mg/dL — ABNORMAL HIGH (ref 65–99)
Potassium: 4.5 mmol/L (ref 3.5–5.3)
Sodium: 143 mmol/L (ref 135–146)

## 2020-03-21 LAB — CBC WITH DIFFERENTIAL/PLATELET
Absolute Monocytes: 537 cells/uL (ref 200–950)
Basophils Absolute: 47 cells/uL (ref 0–200)
Basophils Relative: 0.3 %
Eosinophils Absolute: 0 cells/uL — ABNORMAL LOW (ref 15–500)
Eosinophils Relative: 0 %
HCT: 38.7 % (ref 35.0–45.0)
Hemoglobin: 13.2 g/dL (ref 11.7–15.5)
Lymphs Abs: 932 cells/uL (ref 850–3900)
MCH: 32.4 pg (ref 27.0–33.0)
MCHC: 34.1 g/dL (ref 32.0–36.0)
MCV: 95.1 fL (ref 80.0–100.0)
MPV: 11.2 fL (ref 7.5–12.5)
Monocytes Relative: 3.4 %
Neutro Abs: 14283 cells/uL — ABNORMAL HIGH (ref 1500–7800)
Neutrophils Relative %: 90.4 %
Platelets: 152 10*3/uL (ref 140–400)
RBC: 4.07 10*6/uL (ref 3.80–5.10)
RDW: 14.1 % (ref 11.0–15.0)
Total Lymphocyte: 5.9 %
WBC: 15.8 10*3/uL — ABNORMAL HIGH (ref 3.8–10.8)

## 2020-03-21 MED ORDER — FENTANYL 12 MCG/HR TD PT72: 1.0000 | MEDICATED_PATCH | TRANSDERMAL | 0 refills | Status: DC

## 2020-03-21 MED ORDER — FENTANYL 12 MCG/HR TD PT72
1.0000 | MEDICATED_PATCH | TRANSDERMAL | 0 refills | Status: DC
Start: 2020-03-21 — End: 2020-04-19

## 2020-03-28 ENCOUNTER — Encounter: Payer: Self-pay | Admitting: Family Medicine

## 2020-03-28 MED ORDER — DILTIAZEM HCL ER COATED BEADS 240 MG PO CP24: 240.0000 mg | ORAL_CAPSULE | Freq: Every day | ORAL | 3 refills | Status: DC

## 2020-03-31 ENCOUNTER — Telehealth: Payer: Self-pay | Admitting: Family Medicine

## 2020-03-31 DIAGNOSIS — I5032 Chronic diastolic (congestive) heart failure: Secondary | ICD-10-CM | POA: Diagnosis not present

## 2020-03-31 DIAGNOSIS — S72401A Unspecified fracture of lower end of right femur, initial encounter for closed fracture: Secondary | ICD-10-CM | POA: Diagnosis not present

## 2020-03-31 DIAGNOSIS — M5127 Other intervertebral disc displacement, lumbosacral region: Secondary | ICD-10-CM | POA: Diagnosis not present

## 2020-03-31 DIAGNOSIS — R531 Weakness: Secondary | ICD-10-CM | POA: Diagnosis not present

## 2020-03-31 MED ORDER — HYDROCODONE-ACETAMINOPHEN 10-325 MG PO TABS
ORAL_TABLET | ORAL | 0 refills | Status: DC
Start: 2020-03-31 — End: 2020-05-08

## 2020-03-31 NOTE — Telephone Encounter (Signed)
Hospice pt needing refill of hydrocodone Ran out on 12/29  Having 9/10 pain   Sent #30 after speaking with Hospice nurse   Will route to pcp

## 2020-04-09 ENCOUNTER — Encounter: Payer: Self-pay | Admitting: Family Medicine

## 2020-04-12 ENCOUNTER — Telehealth: Payer: Self-pay | Admitting: Family Medicine

## 2020-04-12 NOTE — Telephone Encounter (Signed)
Almyra Free w/[facility] is calling stating that the pt is in a lot more pain and her fentanyl ([place]) 12 MCG and it not helping the pt with her increase back pain and pt is taking hydrocodone-acetaminophen (NORCO) 10-325 MG for breakthrough pain every 6 hrs due to the severe pain the 8 hrs to long for her to wait.    Can the pt get a refill on her alprazolam Duanne Moron) 0.5 MG  Pharm:  [facility] on [address].  Almyra Free would like to have a call back.

## 2020-04-16 NOTE — Telephone Encounter (Signed)
Please see messages Thank you °

## 2020-04-16 NOTE — Telephone Encounter (Signed)
See if we can set up virtual (30 minute) with patient and her daughter, Betty Jackson to discuss pain management options.

## 2020-04-17 NOTE — Telephone Encounter (Signed)
Pain management appointment scheduled.

## 2020-04-19 ENCOUNTER — Telehealth (INDEPENDENT_AMBULATORY_CARE_PROVIDER_SITE_OTHER): Payer: Medicare Other | Admitting: Family Medicine

## 2020-04-19 DIAGNOSIS — M545 Low back pain, unspecified: Secondary | ICD-10-CM | POA: Diagnosis not present

## 2020-04-19 DIAGNOSIS — M25561 Pain in right knee: Secondary | ICD-10-CM | POA: Diagnosis not present

## 2020-04-19 DIAGNOSIS — G8929 Other chronic pain: Secondary | ICD-10-CM

## 2020-04-19 MED ORDER — FENTANYL 25 MCG/HR TD PT72
1.0000 | MEDICATED_PATCH | TRANSDERMAL | 0 refills | Status: DC
Start: 2020-04-19 — End: 2020-05-08

## 2020-04-19 NOTE — Progress Notes (Signed)
Patient ID: Betty Jackson, female   DOB: Aug 02, 1934, 86 y.o.   MRN: [id number]  This visit type was conducted due to national recommendations for restrictions regarding the COVID-19 pandemic in an effort to limit this patient's exposure and mitigate transmission in our community.   Virtual Visit via Video Note  I connected with Betty Jackson on 04/19/20 at  1:15 PM EST by a video enabled telemedicine application and verified that I am speaking with the correct person using two identifiers.  Location patient: home Location provider:work or home office Persons participating in the virtual visit: patient, provider  I discussed the limitations of evaluation and management by telemedicine and the availability of in person appointments. The patient expressed understanding and agreed to proceed.   HPI:  Betty Jackson called recently along with her daughter Betty Jackson regarding increased pain.  She has history of multiple chronic problems including history of CAD, hypertension, history of temporal arteritis, atrial fibrillation, GERD, chronic back pain with multiple prior surgeries, osteoarthritis involving multiple joints.  She has chronic back pain which has been very debilitating at times.  She is not a candidate for further surgeries.  Also some recent progressive right knee pains.  She states that she feels like her right knee "pops out "frequently when she is walking.  Her major issue though is chronic daily fairly persistent low back pain.  She had become extremely debilitated months ago and was placed on low-dose Duragesic during hospitalization and this seemed to make a tremendous difference initially.  She is currently on 12 mcg every 72 hours.  For several months this seem to work but she recently has been increasing her supplementation with oxycodone 10 mg.  This does help slightly but she states oxycodone usually only last about 2 hours.  She has had recent pains that are frequently waking her from sleep and  occur day and night and at rest and with movement.  She particularly has severe pains with minimal movement such as changing positions.  She frequently rates her pain 10 out of 10.  ROS: See pertinent positives and negatives per HPI.  Past Medical History:  Diagnosis Date  . Acute respiratory failure with hypoxia ([place]) 05/20/2017  . Anemia, unspecified 10/28/2012  . Anxiety state, unspecified 10/28/2012  . CAD (coronary artery disease)    a. Stent RCA in [facility];  b. Cath approx 2009 - nonobs per pt report.  . Cataract    immature on the left eye  . Chronic insomnia 02/07/2013  . Chronic lower back pain    scoliosis  . CKD (chronic kidney disease) stage 3, GFR 30-59 ml/min (HCC)   . CVA (cerebral infarction) 10/29/2012  . DDD (degenerative disc disease)   . Depression   . Diverticulosis   . GERD (gastroesophageal reflux disease) 09/01/2010  . Hemorrhoids   . Herniated nucleus pulposus, L5-S1, right 11/04/2015  . Hyperlipidemia    takes Lipitor daily  . Hypertension    takes Amlodipine,Losartan,Metoprolol,and HCTZ daily  . Incontinence of urine   . Insomnia    takes Restoril nightly  . Lumbar stenosis 04/25/2013  . Major depressive disorder, recurrent episode, moderate ([place]) 07/16/2013  . Osteoporosis   . PAF (paroxysmal atrial fibrillation) ([place]) 2011   a. lone epidode in 2011 according to notes.  . Rheumatoid arthritis ([place])   . Scoliosis   . Sepsis ([place]) 05/2019  . Temporal arteritis ([place]) 2011   a. followed @ [facility]; potential flareup 10/28/2012/notes 10/28/2012  . Vocal cord dysfunction    "  they don't operate properly" (10/28/2012)    Past Surgical History:  Procedure Laterality Date  . ABDOMINAL HYSTERECTOMY  ~ 1984   vaginally  . BACK SURGERY  7-49yrs ago   X Stop  . BLADDER SUSPENSION  2001  . BREAST BIOPSY Right   . CATARACT EXTRACTION W/ INTRAOCULAR LENS IMPLANT Right ~ 08/2012  . CHEST TUBE INSERTION Left 03/08/2019   Procedure: Chest Tube Insertion;  Surgeon: Ivin Poot, MD;  Location: [facility];  Service: Thoracic;  Laterality: Left;  . COLONOSCOPY  01/26/2012   Procedure: COLONOSCOPY;  Surgeon: Ladene Artist, MD,FACG;  Location: [facility] ENDOSCOPY;  Service: Endoscopy;  Laterality: N/A;  note the EGD is possible  . CORONARY ANGIOPLASTY WITH STENT PLACEMENT  2006   X 1 stent  . EPIDURAL BLOCK INJECTION    . ESOPHAGOGASTRODUODENOSCOPY  01/26/2012   Procedure: ESOPHAGOGASTRODUODENOSCOPY (EGD);  Surgeon: Ladene Artist, MD,FACG;  Location: [facility] ENDOSCOPY;  Service: Endoscopy;  Laterality: N/A;  . FINE NEEDLE ASPIRATION Right 09/26/2019   Procedure: FINE NEEDLE ASPIRATION;  Surgeon: Altamese Penbrook, MD;  Location: [place];  Service: Orthopedics;  Laterality: Right;  . HEMIARTHROPLASTY HIP Right 2012  . IR EPIDUROGRAPHY  04/20/2019  . LAPAROSCOPIC CHOLECYSTECTOMY  2001  . LUMBAR FUSION  03/2013  . LUMBAR LAMINECTOMY/DECOMPRESSION MICRODISCECTOMY Right 11/04/2015   Procedure: Right Lumbar Five-Sacral One Microdiskectomy;  Surgeon: Kristeen Miss, MD;  Location: [place] NEURO ORS;  Service: Neurosurgery;  Laterality: Right;  Right L5-S1 Microdiskectomy  . moles removed that required stiches     one on leg and one on face  . ORIF FEMUR FRACTURE Right 09/26/2019   Procedure: OPEN REDUCTION INTERNAL FIXATION (ORIF) DISTAL FEMUR FRACTURE;  Surgeon: Altamese Gregory, MD;  Location: [facility];  Service: Orthopedics;  Laterality: Right;  . ORIF FEMUR FRACTURE Right 10/17/2019   Procedure: OPEN REDUCTION INTERNAL FIXATION (ORIF) DISTAL FEMUR FRACTURE;  Surgeon: Altamese [place], MD;  Location: [place];  Service: Orthopedics;  Laterality: Right;  . SUBXYPHOID PERICARDIAL WINDOW N/A 03/08/2019   Procedure: SUBXYPHOID PERICARDIAL WINDOW;  Surgeon: Ivin Poot, MD;  Location: [place];  Service: Thoracic;  Laterality: N/A;  . TEE WITHOUT CARDIOVERSION N/A 03/08/2019   Procedure: TRANSESOPHAGEAL ECHOCARDIOGRAM (TEE);  Surgeon: Prescott Gum, Collier [place]ina, MD;  Location: [facility];  Service: Thoracic;  Laterality:  N/A;  . TEMPORAL ARTERY BIOPSY / LIGATION Bilateral 2011  . TONSILLECTOMY AND ADENOIDECTOMY     at age 67  . TUBAL LIGATION  ~ 1982  . X-STOP IMPLANTATION  ~ 2010   "lower back" (10/28/2012)    Family History  Problem Relation Age of Onset  . Brain cancer Mother 53  . Heart attack Father   . Breast cancer Daughter 73  . Heart disease Other   . Hypertension Other   . Arthritis Neg Hx   . Colon cancer Neg Hx   . Osteoporosis Neg Hx     SOCIAL HX: Non-smoker.  She is widowed.  Husband died last year.  Very supportive daughters.   Current Outpatient Medications:  .  ALPRAZolam (XANAX) 0.5 MG tablet, Take 1 tablet (0.5 mg total) by mouth at bedtime as needed for sleep. (Patient not taking: Reported on 03/20/2020), Disp: 7 tablet, Rfl: 0 .  apixaban (ELIQUIS) 5 MG TABS tablet, Take 5 mg by mouth 2 (two) times daily., Disp: , Rfl:  .  cholecalciferol (VITAMIN D) 25 MCG tablet, Take 2 tablets (2,000 Units total) by mouth 2 (two) times daily., Disp: , Rfl:  .  diltiazem (CARDIZEM CD) 240 MG 24 hr capsule, Take 1 capsule (240 mg total) by mouth daily., Disp: 90 capsule, Rfl: 3 .  escitalopram (LEXAPRO) 10 MG tablet, Take 1 tablet (10 mg total) by mouth daily., Disp: 90 tablet, Rfl: 1 .  famotidine (PEPCID) 20 MG tablet, Take 1 tablet (20 mg total) by mouth daily., Disp: 30 tablet, Rfl: 1 .  fentaNYL (DURAGESIC) 12 MCG/HR, Place 1 patch onto the skin every 3 (three) days., Disp: 10 patch, Rfl: 0 .  fentaNYL (DURAGESIC) 12 MCG/HR, Place 1 patch onto the skin every 3 (three) days., Disp: 5 patch, Rfl: 0 .  ferrous sulfate 325 (65 FE) MG tablet, Take 1 tablet (325 mg total) by mouth 2 (two) times daily with a meal. (Patient not taking: Reported on 03/20/2020), Disp: 60 tablet, Rfl: 3 .  furosemide (LASIX) 40 MG tablet, Take 1 tablet (40 mg total) by mouth daily., Disp: 90 tablet, Rfl: 1 .  HYDROcodone-acetaminophen (NORCO) 10-325 MG tablet, Take one tablet by mouth every 8 hours as needed for  breakthrough pain., Disp: 30 tablet, Rfl: 0 .  LACTOBACILLUS PO, Take 2 capsules by mouth 3 (three) times daily., Disp: , Rfl:  .  metoprolol tartrate (LOPRESSOR) 50 MG tablet, Take 1 tablet (50 mg total) by mouth 2 (two) times daily., Disp: 30 tablet, Rfl: 0 .  Multiple Vitamin (MULTIVITAMIN WITH MINERALS) TABS tablet, Take 1 tablet by mouth daily., Disp: 60 tablet, Rfl: 0 .  potassium chloride (KLOR-CON) 10 MEQ tablet, Take 1 tablet (10 mEq total) by mouth daily., Disp: 90 tablet, Rfl: 2 .  predniSONE (DELTASONE) 10 MG tablet, Take 1 tablet (10 mg total) by mouth daily with breakfast., Disp: 30 tablet, Rfl: 0 .  senna-docusate (SENOKOT-S) 8.6-50 MG tablet, Take 1 tablet by mouth at bedtime. (Patient not taking: Reported on 03/20/2020), Disp: , Rfl:  .  vitamin C (ASCORBIC ACID) 500 MG tablet, Take 1,000 mg by mouth daily., Disp: , Rfl:  .  vitamin E 180 MG (400 UNITS) capsule, Take 400 Units by mouth daily., Disp: , Rfl:   EXAM:  VITALS per patient if applicable:  GENERAL: alert, oriented, appears well and in no acute distress  HEENT: atraumatic, conjunttiva clear, no obvious abnormalities on inspection of external nose and ears  NECK: normal movements of the head and neck  LUNGS: on inspection no signs of respiratory distress, breathing rate appears normal, no obvious gross SOB, gasping or wheezing  CV: no obvious cyanosis  MS: moves all visible extremities without noticeable abnormality  PSYCH/NEURO: pleasant and cooperative, no obvious depression or anxiety, speech and thought processing grossly intact  ASSESSMENT AND PLAN:  Discussed the following assessment and plan:  #1 chronic low back pain poorly controlled with low-dose Duragesic 12 mcg.  As above, she has been supplementing with oxycodone but getting very poor and very limited relief with oxycodone  -We discussed titrating her Duragesic up to 25 mcg.  We discussed risk benefit of increasing risk of respiratory  suppression things like sedation with increased dose but her current quality of life is extremely poor and she is having very poorly controlled pain overall. -Recommend avoidance of alcohol or sedative-hypnotics in combination especially with increasing dosage of Duragesic.  #2 intermittent right knee pain.  Patient requesting orthopedic referral -We will set up orthopedic surgery referral     I discussed the assessment and treatment plan with the patient. The patient was provided an opportunity to ask questions and all were answered. The patient  agreed with the plan and demonstrated an understanding of the instructions.   The patient was advised to call back or seek an in-person evaluation if the symptoms worsen or if the condition fails to improve as anticipated.     Carolann Littler, MD

## 2020-04-24 DIAGNOSIS — M238X1 Other internal derangements of right knee: Secondary | ICD-10-CM | POA: Diagnosis not present

## 2020-04-24 DIAGNOSIS — M25561 Pain in right knee: Secondary | ICD-10-CM | POA: Diagnosis not present

## 2020-04-26 ENCOUNTER — Other Ambulatory Visit: Payer: Self-pay | Admitting: Family Medicine

## 2020-04-29 ENCOUNTER — Ambulatory Visit: Payer: Medicare Other | Admitting: Gastroenterology

## 2020-05-01 DIAGNOSIS — R531 Weakness: Secondary | ICD-10-CM | POA: Diagnosis not present

## 2020-05-01 DIAGNOSIS — I5032 Chronic diastolic (congestive) heart failure: Secondary | ICD-10-CM | POA: Diagnosis not present

## 2020-05-01 DIAGNOSIS — M5127 Other intervertebral disc displacement, lumbosacral region: Secondary | ICD-10-CM | POA: Diagnosis not present

## 2020-05-01 DIAGNOSIS — T84498D Other mechanical complication of other internal orthopedic devices, implants and grafts, subsequent encounter: Secondary | ICD-10-CM | POA: Diagnosis not present

## 2020-05-01 DIAGNOSIS — S72431D Displaced fracture of medial condyle of right femur, subsequent encounter for closed fracture with routine healing: Secondary | ICD-10-CM | POA: Diagnosis not present

## 2020-05-01 DIAGNOSIS — S72401A Unspecified fracture of lower end of right femur, initial encounter for closed fracture: Secondary | ICD-10-CM | POA: Diagnosis not present

## 2020-05-01 DIAGNOSIS — S72451D Displaced supracondylar fracture without intracondylar extension of lower end of right femur, subsequent encounter for closed fracture with routine healing: Secondary | ICD-10-CM | POA: Diagnosis not present

## 2020-05-07 ENCOUNTER — Encounter: Payer: Self-pay | Admitting: Family Medicine

## 2020-05-08 ENCOUNTER — Other Ambulatory Visit: Payer: Self-pay | Admitting: Cardiovascular Disease

## 2020-05-08 MED ORDER — HYDROCODONE-ACETAMINOPHEN 10-325 MG PO TABS: ORAL_TABLET | ORAL | 0 refills | Status: DC

## 2020-05-08 MED ORDER — FENTANYL 25 MCG/HR TD PT72: 1.0000 | MEDICATED_PATCH | TRANSDERMAL | 0 refills | Status: DC

## 2020-05-09 ENCOUNTER — Ambulatory Visit (HOSPITAL_BASED_OUTPATIENT_CLINIC_OR_DEPARTMENT_OTHER): Payer: Medicare Other | Admitting: Physical Therapy

## 2020-05-09 NOTE — Telephone Encounter (Signed)
Pt last saw Robbie Lis, PA on 07/19/19, last labs 03/20/20 Creat 0.97, age 86, weight 71.4kg, based on specified criteria pt is on appropriate dosage of Eliquis 5mg  BID for afib.  Will refill rx.

## 2020-05-13 ENCOUNTER — Other Ambulatory Visit: Payer: Self-pay

## 2020-05-13 ENCOUNTER — Ambulatory Visit (HOSPITAL_BASED_OUTPATIENT_CLINIC_OR_DEPARTMENT_OTHER): Payer: Medicare Other | Attending: Orthopedic Surgery | Admitting: Physical Therapy

## 2020-05-13 DIAGNOSIS — R293 Abnormal posture: Secondary | ICD-10-CM | POA: Insufficient documentation

## 2020-05-13 DIAGNOSIS — M25551 Pain in right hip: Secondary | ICD-10-CM | POA: Insufficient documentation

## 2020-05-13 DIAGNOSIS — R262 Difficulty in walking, not elsewhere classified: Secondary | ICD-10-CM | POA: Diagnosis not present

## 2020-05-13 DIAGNOSIS — R2689 Other abnormalities of gait and mobility: Secondary | ICD-10-CM | POA: Diagnosis not present

## 2020-05-13 DIAGNOSIS — M6281 Muscle weakness (generalized): Secondary | ICD-10-CM | POA: Insufficient documentation

## 2020-05-13 NOTE — Therapy (Signed)
[place], [place], [address] Phone: [phone]   Fax:  [phone]  Physical Therapy Evaluation  Patient Details  Name: Betty Jackson MRN: [id number] Date of Birth: [birth year] Referring Provider (PT): Altamese [place], MD   Encounter Date: 05/13/2020   PT End of Session - 05/13/20 1358    Visit Number 1    Number of Visits 25    Date for PT Re-Evaluation 07/08/20    Authorization Type U[employer] Medicare    PT Start Time 0626    PT Stop Time 1350    PT Time Calculation (min) 45 min    Equipment Utilized During Treatment Gait belt    Activity Tolerance Patient tolerated treatment well    Behavior During Therapy [facility] for tasks assessed/performed           Past Medical History:  Diagnosis Date  . Acute respiratory failure with hypoxia ([place]) 05/20/2017  . Anemia, unspecified 10/28/2012  . Anxiety state, unspecified 10/28/2012  . CAD (coronary artery disease)    a. Stent RCA in [facility];  b. Cath approx 2009 - nonobs per pt report.  . Cataract    immature on the left eye  . Chronic insomnia 02/07/2013  . Chronic lower back pain    scoliosis  . CKD (chronic kidney disease) stage 3, GFR 30-59 ml/min (HCC)   . CVA (cerebral infarction) 10/29/2012  . DDD (degenerative disc disease)   . Depression   . Diverticulosis   . GERD (gastroesophageal reflux disease) 09/01/2010  . Hemorrhoids   . Herniated nucleus pulposus, L5-S1, right 11/04/2015  . Hyperlipidemia    takes Lipitor daily  . Hypertension    takes Amlodipine,Losartan,Metoprolol,and HCTZ daily  . Incontinence of urine   . Insomnia    takes Restoril nightly  . Lumbar stenosis 04/25/2013  . Major depressive disorder, recurrent episode, moderate ([place]) 07/16/2013  . Osteoporosis   . PAF (paroxysmal atrial fibrillation) ([place]) 2011   a. lone epidode in 2011 according to notes.  . Rheumatoid arthritis ([place])   . Scoliosis   . Sepsis ([place]) 05/2019  .  Temporal arteritis ([place]) 2011   a. followed @ [facility]; potential flareup 10/28/2012/notes 10/28/2012  . Vocal cord dysfunction    "they don't operate properly" (10/28/2012)    Past Surgical History:  Procedure Laterality Date  . ABDOMINAL HYSTERECTOMY  ~ 1984   vaginally  . BACK SURGERY  7-42yrs ago   X Stop  . BLADDER SUSPENSION  2001  . BREAST BIOPSY Right   . CATARACT EXTRACTION W/ INTRAOCULAR LENS IMPLANT Right ~ 08/2012  . CHEST TUBE INSERTION Left 03/08/2019   Procedure: Chest Tube Insertion;  Surgeon: Ivin Poot, MD;  Location: [place];  Service: Thoracic;  Laterality: Left;  . COLONOSCOPY  01/26/2012   Procedure: COLONOSCOPY;  Surgeon: Ladene Artist, MD,FACG;  Location: [facility] ENDOSCOPY;  Service: Endoscopy;  Laterality: N/A;  note the EGD is possible  . CORONARY ANGIOPLASTY WITH STENT PLACEMENT  2006   X 1 stent  . EPIDURAL BLOCK INJECTION    . ESOPHAGOGASTRODUODENOSCOPY  01/26/2012   Procedure: ESOPHAGOGASTRODUODENOSCOPY (EGD);  Surgeon: Ladene Artist, MD,FACG;  Location: [facility] ENDOSCOPY;  Service: Endoscopy;  Laterality: N/A;  . FINE NEEDLE ASPIRATION Right 09/26/2019   Procedure: FINE NEEDLE ASPIRATION;  Surgeon: Altamese [place], MD;  Location: [place];  Service: Orthopedics;  Laterality: Right;  . HEMIARTHROPLASTY HIP Right 2012  . IR EPIDUROGRAPHY  04/20/2019  . LAPAROSCOPIC CHOLECYSTECTOMY  2001  . LUMBAR FUSION  03/2013  . LUMBAR LAMINECTOMY/DECOMPRESSION MICRODISCECTOMY Right 11/04/2015   Procedure: Right Lumbar Five-Sacral One Microdiskectomy;  Surgeon: Kristeen Miss, MD;  Location: [place] NEURO ORS;  Service: Neurosurgery;  Laterality: Right;  Right L5-S1 Microdiskectomy  . moles removed that required stiches     one on leg and one on face  . ORIF FEMUR FRACTURE Right 09/26/2019   Procedure: OPEN REDUCTION INTERNAL FIXATION (ORIF) DISTAL FEMUR FRACTURE;  Surgeon: Altamese [place], MD;  Location: [place];  Service: Orthopedics;  Laterality: Right;  . ORIF FEMUR FRACTURE Right 10/17/2019    Procedure: OPEN REDUCTION INTERNAL FIXATION (ORIF) DISTAL FEMUR FRACTURE;  Surgeon: Altamese [place], MD;  Location: [place];  Service: Orthopedics;  Laterality: Right;  . SUBXYPHOID PERICARDIAL WINDOW N/A 03/08/2019   Procedure: SUBXYPHOID PERICARDIAL WINDOW;  Surgeon: Ivin Poot, MD;  Location: [place];  Service: Thoracic;  Laterality: N/A;  . TEE WITHOUT CARDIOVERSION N/A 03/08/2019   Procedure: TRANSESOPHAGEAL ECHOCARDIOGRAM (TEE);  Surgeon: Prescott Gum, Collier [place], MD;  Location: [place];  Service: Thoracic;  Laterality: N/A;  . TEMPORAL ARTERY BIOPSY / LIGATION Bilateral 2011  . TONSILLECTOMY AND ADENOIDECTOMY     at age 54  . TUBAL LIGATION  ~ 1982  . X-STOP IMPLANTATION  ~ 2010   "lower back" (10/28/2012)    There were no vitals filed for this visit.    Subjective Assessment - 05/13/20 1305    Subjective Pt reports breaking her R femur twice last year (6/21 and then in July) with surgery 10/17/2019. Pt with history of multiple back surgeries and decreased mobility. Per daughter, pt was able to stand and bear weight and walk short distance with her walker prior to falls. Pt was finally cleared to bear weight but got sick and has been deconditioned. Pt reports since getting sick her right knee started hurting. Pt reports R hip/back pain in the morning.    Pertinent History CAD, hypertension, history of temporal arteritis, atrial fibrillation, GERD, chronic back pain with multiple prior surgeries, osteoarthritis involving multiple joints    Limitations Standing    How long can you sit comfortably? N/a    How long can you stand comfortably? Not long    How long can you walk comfortably? 71' with RW (PLOF)    Patient Stated Goals Improve mobility and strength    Currently in Pain? Yes    Pain Score 0-No pain   6 or 7/10 at worst   Pain Location Knee    Pain Orientation Right    Pain Descriptors / Indicators Aching    Pain Type Chronic pain    Pain Radiating Towards None    Pain Onset More than a  month ago   Since fall   Aggravating Factors  Standing    Pain Relieving Factors Not putting weight on it    Effect of Pain on Daily Activities Decreased standing and walking              [facility] PT Assessment - 05/13/20 0001      Assessment   Medical Diagnosis Repair of right distal femur, quad weakness    Referring Provider (PT) Altamese [place], MD    Onset Date/Surgical Date 10/17/19    Prior Therapy Has had [facility] and been to SNF in the past year      Precautions   Precautions Posterior Hip    Precaution Comments Printed handout      Restrictions   Weight Bearing Restrictions Yes  RLE Weight Bearing Weight bearing as tolerated      Balance Screen   Has the patient fallen in the past 6 months --   09/2019   Has the patient had a decrease in activity level because of a fear of falling?  No    Is the patient reluctant to leave their home because of a fear of falling?  No      Home Ecologist residence    Living Arrangements Other (Comment)   Full time caregivers   Type of [place] One level    Home Equipment Transport chair;Walker - 4 wheels;Bedside commode      Prior Function   Level of Independence Needs assistance with ADLs;Needs assistance with transfers   Has caregivers who assist   Vocation Retired      Associate Professor   Overall Cognitive Status Within Functional Limits for tasks assessed      Observation/Other Assessments   Focus on Therapeutic Outcomes (FOTO)  n/a      Posture/Postural Control   Posture/Postural Control Postural limitations    Postural Limitations Anterior pelvic tilt;Flexed trunk   bilat knee flexed in standing (L > R)     Strength   Overall Strength Comments 4-/5 grossly on R LE; 4+/5 on L LE      Palpation   Palpation comment Mild TTP along medial R knee joint line      Transfers   Transfers Sit to Stand;Stand Pivot Transfers    Sit to Stand 4: Min guard;4: Min  assist    Sit to Stand Details Tactile cues for weight shifting;Manual facilitation for weight shifting;Verbal cues for precautions/safety    Five time sit to stand comments  19 sec    Stand Pivot Transfers 4: Min guard      Ambulation/Gait   Ambulation Distance (Feet) 20 Feet    Assistive device Rolling walker    Gait Pattern Step-through pattern;Decreased weight shift to right;Decreased stance time - right;Decreased step length - right;Decreased step length - left;Right flexed knee in stance;Left flexed knee in stance;Trunk flexed    Ambulation Surface Level;Indoor      Standardized Balance Assessment   Standardized Balance Assessment Berg Balance Test      Berg Balance Test   Sit to Stand Able to stand  independently using hands    Standing Unsupported Able to stand 30 seconds unsupported    Sitting with Back Unsupported but Feet Supported on Floor or Stool Able to sit safely and securely 2 minutes    Stand to Sit Controls descent by using hands    Transfers Able to transfer safely, definite need of hands    Standing Unsupported with Eyes Closed Able to stand 10 seconds with supervision    Standing Unsupported with Feet Together Needs help to attain position but able to stand for 30 seconds with feet together    From Standing, Reach Forward with Outstretched Arm Can reach forward >12 cm safely (5")    From Standing Position, Pick up Object from Floor Unable to try/needs assist to keep balance   Limited due to posterior hip precautions   From Standing Position, Turn to Look Behind Over each Shoulder Looks behind from both sides and weight shifts well    Turn 360 Degrees Needs close supervision or verbal cueing   deviates to the right   Standing Unsupported, Alternately Place Feet on Step/Stool Needs  assistance to keep from falling or unable to try   Difficulty weightshifting to R to advance L LE on step   Standing Unsupported, One Foot in Front Needs help to step but can hold 15 seconds     Standing on One Leg Tries to lift leg/unable to hold 3 seconds but remains standing independently    Total Score 29    Berg comment: 29/56      High Level Balance   High Level Balance Comments --                      Objective measurements completed on examination: See above findings.               PT Education - 05/13/20 1357    Education Details Discussed exam findings, POC, goals and aquatic therapy. Reinforced posterior hip precautions per Dr. Carlean Jews referral. Discussed obtaining manual wheel chair from insurance.    Person(s) Educated Patient;Child(ren);Caregiver(s)    Methods Explanation;Demonstration;Handout    Comprehension Verbalized understanding;Returned demonstration;Verbal cues required;Tactile cues required            PT Short Term Goals - 05/13/20 1406      PT SHORT TERM GOAL #1   Title Pt will be independent with HEP    Time 4    Period Weeks    Status New    Target Date 06/10/20      PT SHORT TERM GOAL #2   Title Pt will be able to perform 5x STS in <15 sec to demo improved functional strength    Baseline 19 sec    Time 4    Period Weeks    Status New    Target Date 06/10/20      PT SHORT TERM GOAL #3   Title Pt will be able to decrease TUG score by at least 10 sec to demo MCID    Baseline 40 sec    Time 4    Period Weeks    Status New    Target Date 06/10/20      PT SHORT TERM GOAL #4   Title Pt will be fully independent with transfers    Baseline Requires min A at times    Time 4    Period Weeks    Status New    Target Date 06/10/20             PT Long Term Goals - 05/13/20 1411      PT LONG TERM GOAL #1   Title Pt will be independent with maintaining mobility at home    Time 8    Period Weeks    Status New    Target Date 07/08/20      PT LONG TERM GOAL #2   Title Pt will have improved 5 x STS to </=13 sec to demo decreased fall risk    Baseline 19 sec    Time 8    Period Weeks    Status New     Target Date 07/08/20      PT LONG TERM GOAL #3   Title Pt will have improved TUG score to </=20 sec    Baseline 40 sec    Time 8    Period Weeks    Status New    Target Date 07/08/20      PT LONG TERM GOAL #4   Title Pt will have improved Berg Score to at least 37/56 to demo [place] for decreased fall risk  Baseline 29/56    Time 8    Period Weeks    Status New    Target Date 07/08/20                  Plan - 05/13/20 1359    Clinical Impression Statement Pt is an 86 y/o F presenting to OPPT s/p R distal femur fracture repair on 10/17/19 with history of general deconditioning. On assessment, pt demos R>L LE weakness with history of pain (no pain during eval), decreased balance and R weightshift, activity tolerance and poor posture affecting her functional mobility such as transfers and ambulation. Pt with recent onset of R knee pain that sounds consistent to patellar tendinitis. Pt would benefit from therapy to address these issues for return to as close to her baseline from the falls as possible.    Personal Factors and Comorbidities Age;Fitness;Comorbidity 1;Comorbidity 2;Comorbidity 3+;Time since onset of injury/illness/exacerbation    Comorbidities CAD, hypertension, history of temporal arteritis, atrial fibrillation, GERD, chronic back pain with multiple prior surgeries, osteoarthritis involving multiple joints    Examination-Activity Limitations Bed Mobility;Bend;Locomotion Level;Transfers;Dressing;Stand;Squat    Examination-Participation Restrictions Community Activity;Cleaning;Shop;Laundry    Stability/Clinical Decision Making Evolving/Moderate complexity    Clinical Decision Making Moderate    Rehab Potential Good    PT Frequency 3x / week   2x/wk on land; 1x/wk aquatics   PT Duration 8 weeks    PT Treatment/Interventions Aquatic Therapy;ADLs/Self Care Home Management;Cryotherapy;Electrical Stimulation;Biofeedback;Moist Heat;DME Instruction;Gait training;Stair  training;Functional mobility training;Therapeutic activities;Therapeutic exercise;Balance training;Neuromuscular re-education;Manual techniques;Patient/family education;Orthotic Fit/Training;Dry needling;Taping;Vasopneumatic Device    PT Next Visit Plan Nu-step for endurance/strengthening. Initiate strengthening HEP with weights. Work on balance and standing tolerance. Manual therapy as indicated for bilat knee. Follow-up on aquatics    PT Home Exercise Plan Reinforced posterior hip precautions    Consulted and Agree with Plan of Care Patient;Family member/caregiver    Family Member Consulted Daughter and caregiver           Patient will benefit from skilled therapeutic intervention in order to improve the following deficits and impairments:  Abnormal gait,Decreased range of motion,Difficulty walking,Decreased endurance,Decreased activity tolerance,Pain,Decreased balance,Hypomobility,Improper body mechanics,Decreased mobility,Decreased strength,Postural dysfunction  Visit Diagnosis: Muscle weakness (generalized) - Plan: PT plan of care cert/re-cert  Pain in right hip - Plan: PT plan of care cert/re-cert  Abnormal posture - Plan: PT plan of care cert/re-cert  Difficulty in walking, not elsewhere classified - Plan: PT plan of care cert/re-cert  Other abnormalities of gait and mobility - Plan: PT plan of care cert/re-cert     Problem List Patient Active Problem List   Diagnosis Date Noted  . Palliative care by specialist   . Goals of care, counseling/discussion   . DNR (do not resuscitate)   . Weakness   . Advanced directives, counseling/discussion   . Pressure injury of skin 10/18/2019  . Closed fracture of right distal femur ([place]) 10/17/2019  . Femur fracture, right ([place]) 09/25/2019  . Chronic diastolic CHF (congestive heart failure) ([place]) 09/25/2019  . HCAP (healthcare-associated pneumonia) 06/19/2019  . Encephalopathy 06/19/2019  . Lumbar degenerative disc disease   .  Paroxysmal A-fib ([place])   . Hypomagnesemia   . C. difficile colitis   . Diarrhea 05/29/2019  . CHF (congestive heart failure) ([place]) 05/29/2019  . Recurrent falls 04/30/2019  . Pericardial effusion without cardiac tamponade 03/29/2019  . Hypoalbuminemia 03/27/2019  . Cardiac tamponade   . Pericarditis 03/06/2019  . Fever   . Septic shock ([place])   . Atypical chest pain   .  Atrial fibrillation with rapid ventricular response ([place])   . Nausea 01/17/2019  . Acute diverticulitis 01/13/2019  . Chronic back pain 01/13/2019  . Tachycardia 01/13/2019  . Hypokalemia 01/13/2019  . Chronic low back pain 07/10/2018  . Influenza A 05/20/2017  . Severe sepsis ([place]) 05/20/2017  . Acute respiratory failure with hypoxia ([place]) 05/20/2017  . CKD (chronic kidney disease), stage III ([place]) 05/20/2017  . PAF (paroxysmal atrial fibrillation) ([place]) 05/20/2017  . HTN (hypertension) 05/20/2017  . Giant cell arteritis ([place]) 05/20/2017  . Coronary disease 05/20/2017  . Abnormal urinalysis 05/20/2017  . Osteoporosis 05/08/2016  . Herniated nucleus pulposus, L5-S1, right 11/04/2015  . Major depressive disorder, recurrent episode, moderate ([place]) 07/16/2013  . Abdominal pain, epigastric 06/22/2013  . Lumbar stenosis 04/25/2013  . Chronic insomnia 02/07/2013  . Elevated transaminase level 02/07/2013  . CVA (cerebral infarction) 10/29/2012  . Visual changes 10/28/2012  . Depression 10/28/2012  . Anxiety state 10/28/2012  . Anemia, unspecified 10/28/2012  . Nonspecific (abnormal) findings on radiological and other examination of gastrointestinal tract 01/25/2012  . Hyponatremia 01/24/2012  . Hematuria 01/24/2012  . Enteritis 12/24/2011  . Abdominal pain, other specified site 12/24/2011  . Leg pain, right 02/18/2011  . Temporal arteritis ([place]) 09/01/2010  . Hypertension 09/01/2010  . Hyperlipidemia 09/01/2010  . History of atrial fibrillation 09/01/2010  . CAD (coronary artery disease) 09/01/2010  . GERD  (gastroesophageal reflux disease) 09/01/2010  . Insomnia 09/01/2010    Daniesha Driver April Ma L Rajeev Escue PT, DPT 05/13/2020, 2:23 PM  [facility] [place], [place], [address] Phone: [phone]   Fax:  [phone]  Name: RIVERS GASSMANN MRN: [id number] Date of Birth: 07/22/1934

## 2020-05-15 ENCOUNTER — Other Ambulatory Visit: Payer: Self-pay

## 2020-05-15 ENCOUNTER — Ambulatory Visit (HOSPITAL_BASED_OUTPATIENT_CLINIC_OR_DEPARTMENT_OTHER): Payer: Medicare Other | Attending: Orthopedic Surgery | Admitting: Physical Therapy

## 2020-05-15 DIAGNOSIS — R262 Difficulty in walking, not elsewhere classified: Secondary | ICD-10-CM | POA: Diagnosis not present

## 2020-05-15 DIAGNOSIS — M6281 Muscle weakness (generalized): Secondary | ICD-10-CM | POA: Insufficient documentation

## 2020-05-15 DIAGNOSIS — R293 Abnormal posture: Secondary | ICD-10-CM | POA: Diagnosis not present

## 2020-05-15 DIAGNOSIS — R2689 Other abnormalities of gait and mobility: Secondary | ICD-10-CM | POA: Insufficient documentation

## 2020-05-15 DIAGNOSIS — M25551 Pain in right hip: Secondary | ICD-10-CM | POA: Diagnosis not present

## 2020-05-15 NOTE — Therapy (Signed)
[place], [place], [address] Phone: [phone]   Fax:  [phone]  Physical Therapy Treatment  Patient Details  Name: Betty Jackson MRN: [id number] Date of Birth: [birth year] Referring Provider (PT): Altamese Fence Lake, MD   Encounter Date: 05/15/2020   PT End of Session - 05/15/20 1239    Visit Number 2    Number of Visits 25    Date for PT Re-Evaluation 07/08/20    Authorization Type [employer] Medicare    PT Start Time 1145    PT Stop Time 1230    PT Time Calculation (min) 45 min    Equipment Utilized During Treatment Gait belt    Activity Tolerance Patient tolerated treatment well    Behavior During Therapy [facility] for tasks assessed/performed           Past Medical History:  Diagnosis Date  . Acute respiratory failure with hypoxia ([place]) 05/20/2017  . Anemia, unspecified 10/28/2012  . Anxiety state, unspecified 10/28/2012  . CAD (coronary artery disease)    a. Stent RCA in [facility];  b. Cath approx 2009 - nonobs per pt report.  . Cataract    immature on the left eye  . Chronic insomnia 02/07/2013  . Chronic lower back pain    scoliosis  . CKD (chronic kidney disease) stage 3, GFR 30-59 ml/min (HCC)   . CVA (cerebral infarction) 10/29/2012  . DDD (degenerative disc disease)   . Depression   . Diverticulosis   . GERD (gastroesophageal reflux disease) 09/01/2010  . Hemorrhoids   . Herniated nucleus pulposus, L5-S1, right 11/04/2015  . Hyperlipidemia    takes Lipitor daily  . Hypertension    takes Amlodipine,Losartan,Metoprolol,and HCTZ daily  . Incontinence of urine   . Insomnia    takes Restoril nightly  . Lumbar stenosis 04/25/2013  . Major depressive disorder, recurrent episode, moderate ([place]) 07/16/2013  . Osteoporosis   . PAF (paroxysmal atrial fibrillation) ([place]) 2011   a. lone epidode in 2011 according to notes.  . Rheumatoid arthritis ([place])   . Scoliosis   . Sepsis ([place]) 05/2019  .  Temporal arteritis ([place]) 2011   a. followed @ [facility]; potential flareup 10/28/2012/notes 10/28/2012  . Vocal cord dysfunction    "they don't operate properly" (10/28/2012)    Past Surgical History:  Procedure Laterality Date  . ABDOMINAL HYSTERECTOMY  ~ 1984   vaginally  . BACK SURGERY  7-72yrs ago   X Stop  . BLADDER SUSPENSION  2001  . BREAST BIOPSY Right   . CATARACT EXTRACTION W/ INTRAOCULAR LENS IMPLANT Right ~ 08/2012  . CHEST TUBE INSERTION Left 03/08/2019   Procedure: Chest Tube Insertion;  Surgeon: Ivin Poot, MD;  Location: [place];  Service: Thoracic;  Laterality: Left;  . COLONOSCOPY  01/26/2012   Procedure: COLONOSCOPY;  Surgeon: Ladene Artist, MD,FACG;  Location: [facility] ENDOSCOPY;  Service: Endoscopy;  Laterality: N/A;  note the EGD is possible  . CORONARY ANGIOPLASTY WITH STENT PLACEMENT  2006   X 1 stent  . EPIDURAL BLOCK INJECTION    . ESOPHAGOGASTRODUODENOSCOPY  01/26/2012   Procedure: ESOPHAGOGASTRODUODENOSCOPY (EGD);  Surgeon: Ladene Artist, MD,FACG;  Location[facility] ENDOSCOPY;  Service: Endoscopy;  Laterality: N/A;  . FINE NEEDLE ASPIRATION Right 09/26/2019   Procedure: FINE NEEDLE ASPIRATION;  Surgeon: Altamese Republic, MD;  Location: [place];  Service: Orthopedics;  Laterality: Right;  . HEMIARTHROPLASTY HIP Right 2012  . IR EPIDUROGRAPHY  04/20/2019  . LAPAROSCOPIC CHOLECYSTECTOMY  2001  . LUMBAR FUSION  03/2013  . LUMBAR LAMINECTOMY/DECOMPRESSION MICRODISCECTOMY Right 11/04/2015   Procedure: Right Lumbar Five-Sacral One Microdiskectomy;  Surgeon: Kristeen Miss, MD;  Location: [place] NEURO ORS;  Service: Neurosurgery;  Laterality: Right;  Right L5-S1 Microdiskectomy  . moles removed that required stiches     one on leg and one on face  . ORIF FEMUR FRACTURE Right 09/26/2019   Procedure: OPEN REDUCTION INTERNAL FIXATION (ORIF) DISTAL FEMUR FRACTURE;  Surgeon: Altamese Pageton, MD;  Location: [place];  Service: Orthopedics;  Laterality: Right;  . ORIF FEMUR FRACTURE Right 10/17/2019    Procedure: OPEN REDUCTION INTERNAL FIXATION (ORIF) DISTAL FEMUR FRACTURE;  Surgeon: Altamese Cordova, MD;  Location: [place];  Service: Orthopedics;  Laterality: Right;  . SUBXYPHOID PERICARDIAL WINDOW N/A 03/08/2019   Procedure: SUBXYPHOID PERICARDIAL WINDOW;  Surgeon: Ivin Poot, MD;  Location: [place];  Service: Thoracic;  Laterality: N/A;  . TEE WITHOUT CARDIOVERSION N/A 03/08/2019   Procedure: TRANSESOPHAGEAL ECHOCARDIOGRAM (TEE);  Surgeon: Prescott Gum, Collier Salina, MD;  Location: [place];  Service: Thoracic;  Laterality: N/A;  . TEMPORAL ARTERY BIOPSY / LIGATION Bilateral 2011  . TONSILLECTOMY AND ADENOIDECTOMY     at age 12  . TUBAL LIGATION  ~ 1982  . X-STOP IMPLANTATION  ~ 2010   "lower back" (10/28/2012)    There were no vitals filed for this visit.   Subjective Assessment - 05/15/20 1240    Subjective Pt reports nothing new or different since last session.    Pertinent History CAD, hypertension, history of temporal arteritis, atrial fibrillation, GERD, chronic back pain with multiple prior surgeries, osteoarthritis involving multiple joints    Limitations Standing    How long can you sit comfortably? N/a    How long can you stand comfortably? Not long    How long can you walk comfortably? 69' with RW (PLOF)    Patient Stated Goals Improve mobility and strength    Pain Onset More than a month ago   Since fall                            [facility] Adult PT Treatment/Exercise - 05/15/20 0001      Knee/Hip Exercises: Aerobic   Nustep L4 x 5 min      Knee/Hip Exercises: Standing   Heel Raises Both;2 sets;10 reps    Hip Abduction Stengthening;Both;2 sets;10 reps    Other Standing Knee Exercises Partial sit<>stand x10      Knee/Hip Exercises: Supine   Quad Sets Strengthening;Both;10 reps    Quad Sets Limitations 3 sec hold    Short Arc Quad Sets Strengthening;Both;10 reps    Bridges Strengthening;Both;10 reps;2 sets    Other Supine Knee/Hip Exercises Clamshell with green  tband x10                    PT Short Term Goals - 05/13/20 1406      PT SHORT TERM GOAL #1   Title Pt will be independent with HEP    Time 4    Period Weeks    Status New    Target Date 06/10/20      PT SHORT TERM GOAL #2   Title Pt will be able to perform 5x STS in <15 sec to demo improved functional strength    Baseline 19 sec    Time 4    Period Weeks    Status New    Target Date 06/10/20  PT SHORT TERM GOAL #3   Title Pt will be able to decrease TUG score by at least 10 sec to demo MCID    Baseline 40 sec    Time 4    Period Weeks    Status New    Target Date 06/10/20      PT SHORT TERM GOAL #4   Title Pt will be fully independent with transfers    Baseline Requires min A at times    Time 4    Period Weeks    Status New    Target Date 06/10/20             PT Long Term Goals - 05/13/20 1411      PT LONG TERM GOAL #1   Title Pt will be independent with maintaining mobility at home    Time 8    Period Weeks    Status New    Target Date 07/08/20      PT LONG TERM GOAL #2   Title Pt will have improved 5 x STS to </=13 sec to demo decreased fall risk    Baseline 19 sec    Time 8    Period Weeks    Status New    Target Date 07/08/20      PT LONG TERM GOAL #3   Title Pt will have improved TUG score to </=20 sec    Baseline 40 sec    Time 8    Period Weeks    Status New    Target Date 07/08/20      PT LONG TERM GOAL #4   Title Pt will have improved Berg Score to at least 37/56 to demo [place] for decreased fall risk    Baseline 29/56    Time 8    Period Weeks    Status New    Target Date 07/08/20                 Plan - 05/15/20 1206    Clinical Impression Statement Treatment focused on gross hip and quad strengthening. Initiated HEP. Pt tolerated treatment well. Requires increased guarding of knees while performing hip abduction exercises for safety. Pt requires increased cueing to maintain trunk/hip extension.    Personal  Factors and Comorbidities Age;Fitness;Comorbidity 1;Comorbidity 2;Comorbidity 3+;Time since onset of injury/illness/exacerbation    Comorbidities CAD, hypertension, history of temporal arteritis, atrial fibrillation, GERD, chronic back pain with multiple prior surgeries, osteoarthritis involving multiple joints    Examination-Activity Limitations Bed Mobility;Bend;Locomotion Level;Transfers;Dressing;Stand;Squat    Examination-Participation Restrictions Community Activity;Cleaning;Shop;Laundry    Stability/Clinical Decision Making Evolving/Moderate complexity    Rehab Potential Good    PT Frequency 3x / week   2x/wk on land; 1x/wk aquatics   PT Duration 8 weeks    PT Treatment/Interventions Aquatic Therapy;ADLs/Self Care Home Management;Cryotherapy;Electrical Stimulation;Biofeedback;Moist Heat;DME Instruction;Gait training;Stair training;Functional mobility training;Therapeutic activities;Therapeutic exercise;Balance training;Neuromuscular re-education;Manual techniques;Patient/family education;Orthotic Fit/Training;Dry needling;Taping;Vasopneumatic Device    PT Next Visit Plan Nu-step for endurance/strengthening. Initiate strengthening HEP with weights if indicated. Work on balance and standing tolerance. Manual therapy as indicated for bilat knee. Follow-up on aquatics    PT Home Exercise Plan Access Code: [email or web address].    Consulted and Agree with Plan of Care Patient;Family member/caregiver    Family Member Consulted Daughter and caregiver           Patient will benefit from skilled therapeutic intervention in order to improve the following deficits and impairments:  Abnormal gait,Decreased range of motion,Difficulty walking,Decreased endurance,Decreased activity tolerance,Pain,Decreased balance,Hypomobility,Improper  body mechanics,Decreased mobility,Decreased strength,Postural dysfunction  Visit Diagnosis: Muscle weakness (generalized)  Pain in right hip  Abnormal posture  Difficulty in  walking, not elsewhere classified  Other abnormalities of gait and mobility     Problem List Patient Active Problem List   Diagnosis Date Noted  . Palliative care by specialist   . Goals of care, counseling/discussion   . DNR (do not resuscitate)   . Weakness   . Advanced directives, counseling/discussion   . Pressure injury of skin 10/18/2019  . Closed fracture of right distal femur ([place]) 10/17/2019  . Femur fracture, right ([place]) 09/25/2019  . Chronic diastolic CHF (congestive heart failure) ([place]) 09/25/2019  . HCAP (healthcare-associated pneumonia) 06/19/2019  . Encephalopathy 06/19/2019  . Lumbar degenerative disc disease   . Paroxysmal A-fib ([place])   . Hypomagnesemia   . C. difficile colitis   . Diarrhea 05/29/2019  . CHF (congestive heart failure) ([place]) 05/29/2019  . Recurrent falls 04/30/2019  . Pericardial effusion without cardiac tamponade 03/29/2019  . Hypoalbuminemia 03/27/2019  . Cardiac tamponade   . Pericarditis 03/06/2019  . Fever   . Septic shock ([place])   . Atypical chest pain   . Atrial fibrillation with rapid ventricular response ([place])   . Nausea 01/17/2019  . Acute diverticulitis 01/13/2019  . Chronic back pain 01/13/2019  . Tachycardia 01/13/2019  . Hypokalemia 01/13/2019  . Chronic low back pain 07/10/2018  . Influenza A 05/20/2017  . Severe sepsis ([place]) 05/20/2017  . Acute respiratory failure with hypoxia ([place]) 05/20/2017  . CKD (chronic kidney disease), stage III ([place]) 05/20/2017  . PAF (paroxysmal atrial fibrillation) ([place]) 05/20/2017  . HTN (hypertension) 05/20/2017  . Giant cell arteritis ([place]) 05/20/2017  . Coronary disease 05/20/2017  . Abnormal urinalysis 05/20/2017  . Osteoporosis 05/08/2016  . Herniated nucleus pulposus, L5-S1, right 11/04/2015  . Major depressive disorder, recurrent episode, moderate ([place]) 07/16/2013  . Abdominal pain, epigastric 06/22/2013  . Lumbar stenosis 04/25/2013  . Chronic insomnia 02/07/2013  . Elevated  transaminase level 02/07/2013  . CVA (cerebral infarction) 10/29/2012  . Visual changes 10/28/2012  . Depression 10/28/2012  . Anxiety state 10/28/2012  . Anemia, unspecified 10/28/2012  . Nonspecific (abnormal) findings on radiological and other examination of gastrointestinal tract 01/25/2012  . Hyponatremia 01/24/2012  . Hematuria 01/24/2012  . Enteritis 12/24/2011  . Abdominal pain, other specified site 12/24/2011  . Leg pain, right 02/18/2011  . Temporal arteritis ([place]) 09/01/2010  . Hypertension 09/01/2010  . Hyperlipidemia 09/01/2010  . History of atrial fibrillation 09/01/2010  . CAD (coronary artery disease) 09/01/2010  . GERD (gastroesophageal reflux disease) 09/01/2010  . Insomnia 09/01/2010    Shahla Betsill April Ma L Jenny Omdahl PT, DPT 05/15/2020, 12:41 PM  [facility] [address] [place], [place], [address] Phone: [phone]   Fax:  [phone]  Name: Betty Jackson MRN: [id number] Date of Birth: 10-13-1934

## 2020-05-16 ENCOUNTER — Telehealth: Payer: Self-pay | Admitting: Family Medicine

## 2020-05-16 NOTE — Progress Notes (Signed)
  Chronic Care Management   Outreach Note  05/16/2020 Name: KAIDAN SPENGLER MRN: [id number] DOB: 1934/09/01  Referred by: Eulas Post, MD Reason for referral : No chief complaint on file.   Third unsuccessful telephone outreach was attempted today. The patient was referred to the pharmacist for assistance with care management and care coordination.   Follow Up Plan:   Carley Perdue [employer] Scheduler

## 2020-05-20 ENCOUNTER — Other Ambulatory Visit: Payer: Self-pay

## 2020-05-20 ENCOUNTER — Ambulatory Visit (HOSPITAL_BASED_OUTPATIENT_CLINIC_OR_DEPARTMENT_OTHER): Payer: Medicare Other | Attending: Orthopedic Surgery | Admitting: Physical Therapy

## 2020-05-20 DIAGNOSIS — R262 Difficulty in walking, not elsewhere classified: Secondary | ICD-10-CM | POA: Insufficient documentation

## 2020-05-20 DIAGNOSIS — R2689 Other abnormalities of gait and mobility: Secondary | ICD-10-CM | POA: Diagnosis not present

## 2020-05-20 DIAGNOSIS — M25551 Pain in right hip: Secondary | ICD-10-CM | POA: Insufficient documentation

## 2020-05-20 DIAGNOSIS — R293 Abnormal posture: Secondary | ICD-10-CM | POA: Insufficient documentation

## 2020-05-20 DIAGNOSIS — M6281 Muscle weakness (generalized): Secondary | ICD-10-CM | POA: Diagnosis not present

## 2020-05-20 NOTE — Therapy (Signed)
[place], [place], [address] Phone: [phone]   Fax:  [phone]  Physical Therapy Treatment  Patient Details  Name: Betty Jackson MRN: [id number] Date of Birth: 1934-07-18 Referring Provider (PT): Altamese [place], MD   Encounter Date: 05/20/2020   PT End of Session - 05/20/20 1143    Visit Number 3    Number of Visits 25    Date for PT Re-Evaluation 07/08/20    Authorization Type [employer] Medicare    PT Start Time 1145    PT Stop Time 1230    PT Time Calculation (min) 45 min    Equipment Utilized During Treatment Gait belt    Activity Tolerance Patient tolerated treatment well    Behavior During Therapy [facility] for tasks assessed/performed           Past Medical History:  Diagnosis Date  . Acute respiratory failure with hypoxia ([place]) 05/20/2017  . Anemia, unspecified 10/28/2012  . Anxiety state, unspecified 10/28/2012  . CAD (coronary artery disease)    a. Stent RCA in [facility];  b. Cath approx 2009 - nonobs per pt report.  . Cataract    immature on the left eye  . Chronic insomnia 02/07/2013  . Chronic lower back pain    scoliosis  . CKD (chronic kidney disease) stage 3, GFR 30-59 ml/min (HCC)   . CVA (cerebral infarction) 10/29/2012  . DDD (degenerative disc disease)   . Depression   . Diverticulosis   . GERD (gastroesophageal reflux disease) 09/01/2010  . Hemorrhoids   . Herniated nucleus pulposus, L5-S1, right 11/04/2015  . Hyperlipidemia    takes Lipitor daily  . Hypertension    takes Amlodipine,Losartan,Metoprolol,and HCTZ daily  . Incontinence of urine   . Insomnia    takes Restoril nightly  . Lumbar stenosis 04/25/2013  . Major depressive disorder, recurrent episode, moderate ([place]) 07/16/2013  . Osteoporosis   . PAF (paroxysmal atrial fibrillation) ([place]) 2011   a. lone epidode in 2011 according to notes.  . Rheumatoid arthritis ([place])   . Scoliosis   . Sepsis ([place]) 05/2019  .  Temporal arteritis ([place]) 2011   a. followed @ [facility]; potential flareup 10/28/2012/notes 10/28/2012  . Vocal cord dysfunction    "they don't operate properly" (10/28/2012)    Past Surgical History:  Procedure Laterality Date  . ABDOMINAL HYSTERECTOMY  ~ 1984   vaginally  . BACK SURGERY  7-33yrs ago   X Stop  . BLADDER SUSPENSION  2001  . BREAST BIOPSY Right   . CATARACT EXTRACTION W/ INTRAOCULAR LENS IMPLANT Right ~ 08/2012  . CHEST TUBE INSERTION Left 03/08/2019   Procedure: Chest Tube Insertion;  Surgeon: Ivin Poot, MD;  Location: [place];  Service: Thoracic;  Laterality: Left;  . COLONOSCOPY  01/26/2012   Procedure: COLONOSCOPY;  Surgeon: Ladene Artist, MD,FACG;  Location: [facility] ENDOSCOPY;  Service: Endoscopy;  Laterality: N/A;  note the EGD is possible  . CORONARY ANGIOPLASTY WITH STENT PLACEMENT  2006   X 1 stent  . EPIDURAL BLOCK INJECTION    . ESOPHAGOGASTRODUODENOSCOPY  01/26/2012   Procedure: ESOPHAGOGASTRODUODENOSCOPY (EGD);  Surgeon: Ladene Artist, MD,FACG;  Location: [facility] ENDOSCOPY;  Service: Endoscopy;  Laterality: N/A;  . FINE NEEDLE ASPIRATION Right 09/26/2019   Procedure: FINE NEEDLE ASPIRATION;  Surgeon: Altamese [place], MD;  Location: [place];  Service: Orthopedics;  Laterality: Right;  . HEMIARTHROPLASTY HIP Right 2012  . IR EPIDUROGRAPHY  04/20/2019  . LAPAROSCOPIC CHOLECYSTECTOMY  2001  . LUMBAR FUSION  03/2013  . LUMBAR LAMINECTOMY/DECOMPRESSION MICRODISCECTOMY Right 11/04/2015   Procedure: Right Lumbar Five-Sacral One Microdiskectomy;  Surgeon: Kristeen Miss, MD;  Location: [place] NEURO ORS;  Service: Neurosurgery;  Laterality: Right;  Right L5-S1 Microdiskectomy  . moles removed that required stiches     one on leg and one on face  . ORIF FEMUR FRACTURE Right 09/26/2019   Procedure: OPEN REDUCTION INTERNAL FIXATION (ORIF) DISTAL FEMUR FRACTURE;  Surgeon: Altamese [place], MD;  Location: [place];  Service: Orthopedics;  Laterality: Right;  . ORIF FEMUR FRACTURE Right 10/17/2019    Procedure: OPEN REDUCTION INTERNAL FIXATION (ORIF) DISTAL FEMUR FRACTURE;  Surgeon: Altamese [place], MD;  Location: [place];  Service: Orthopedics;  Laterality: Right;  . SUBXYPHOID PERICARDIAL WINDOW N/A 03/08/2019   Procedure: SUBXYPHOID PERICARDIAL WINDOW;  Surgeon: Ivin Poot, MD;  Location: [place];  Service: Thoracic;  Laterality: N/A;  . TEE WITHOUT CARDIOVERSION N/A 03/08/2019   Procedure: TRANSESOPHAGEAL ECHOCARDIOGRAM (TEE);  Surgeon: Prescott Gum, Collier [place], MD;  Location: [place];  Service: Thoracic;  Laterality: N/A;  . TEMPORAL ARTERY BIOPSY / LIGATION Bilateral 2011  . TONSILLECTOMY AND ADENOIDECTOMY     at age 54  . TUBAL LIGATION  ~ 1982  . X-STOP IMPLANTATION  ~ 2010   "lower back" (10/28/2012)    There were no vitals filed for this visit.   Subjective Assessment - 05/20/20 1146    Subjective Pt states she wasn't feeling too well this weekend and was not able to try the exercises.    Pertinent History CAD, hypertension, history of temporal arteritis, atrial fibrillation, GERD, chronic back pain with multiple prior surgeries, osteoarthritis involving multiple joints    Limitations Standing    How long can you sit comfortably? N/a    How long can you stand comfortably? Not long    How long can you walk comfortably? 46' with RW (PLOF)    Patient Stated Goals Improve mobility and strength    Currently in Pain? No/denies    Pain Onset More than a month ago   Since fall                            [facility] Adult PT Treatment/Exercise - 05/20/20 0001      Ambulation/Gait   Ambulation Distance (Feet) 50 Feet    Assistive device Rolling walker    Gait Pattern Step-through pattern;Decreased weight shift to right;Decreased stance time - right;Decreased step length - right;Decreased step length - left;Right flexed knee in stance;Left flexed knee in stance;Trunk flexed    Ambulation Surface Level;Indoor    Gait Comments cues for increased hip, trunk, and knee extension       Knee/Hip Exercises: Aerobic   Nustep L4 x 5 min UE & LEs      Knee/Hip Exercises: Machines for Strengthening   Other Machine Shuttle DL press at 37# x10, 50# x10      Knee/Hip Exercises: Standing   Heel Raises Both;2 sets;10 reps      Knee/Hip Exercises: Supine   Short Arc Quad Sets Strengthening;Both;10 reps;2 sets    Short Arc Quad Sets Limitations 1#    Bridges Strengthening;Both;10 reps;2 sets    Other Supine Knee/Hip Exercises Clamshell with green tband 2x10                    PT Short Term Goals - 05/13/20 1406      PT SHORT TERM GOAL #  1   Title Pt will be independent with HEP    Time 4    Period Weeks    Status New    Target Date 06/10/20      PT SHORT TERM GOAL #2   Title Pt will be able to perform 5x STS in <15 sec to demo improved functional strength    Baseline 19 sec    Time 4    Period Weeks    Status New    Target Date 06/10/20      PT SHORT TERM GOAL #3   Title Pt will be able to decrease TUG score by at least 10 sec to demo MCID    Baseline 40 sec    Time 4    Period Weeks    Status New    Target Date 06/10/20      PT SHORT TERM GOAL #4   Title Pt will be fully independent with transfers    Baseline Requires min A at times    Time 4    Period Weeks    Status New    Target Date 06/10/20             PT Long Term Goals - 05/13/20 1411      PT LONG TERM GOAL #1   Title Pt will be independent with maintaining mobility at home    Time 8    Period Weeks    Status New    Target Date 07/08/20      PT LONG TERM GOAL #2   Title Pt will have improved 5 x STS to </=13 sec to demo decreased fall risk    Baseline 19 sec    Time 8    Period Weeks    Status New    Target Date 07/08/20      PT LONG TERM GOAL #3   Title Pt will have improved TUG score to </=20 sec    Baseline 40 sec    Time 8    Period Weeks    Status New    Target Date 07/08/20      PT LONG TERM GOAL #4   Title Pt will have improved Berg Score to at least  37/56 to demo Jeannette for decreased fall risk    Baseline 29/56    Time 8    Period Weeks    Status New    Target Date 07/08/20                 Plan - 05/20/20 1156    Clinical Impression Statement Treatment focused on progressing hip and quad strengthening. Reviewed HEP. Pt tolerated treatment well.    Personal Factors and Comorbidities Age;Fitness;Comorbidity 1;Comorbidity 2;Comorbidity 3+;Time since onset of injury/illness/exacerbation    Comorbidities CAD, hypertension, history of temporal arteritis, atrial fibrillation, GERD, chronic back pain with multiple prior surgeries, osteoarthritis involving multiple joints    Examination-Activity Limitations Bed Mobility;Bend;Locomotion Level;Transfers;Dressing;Stand;Squat    Examination-Participation Restrictions Community Activity;Cleaning;Shop;Laundry    Stability/Clinical Decision Making Evolving/Moderate complexity    Rehab Potential Good    PT Frequency 3x / week   2x/wk on land; 1x/wk aquatics   PT Duration 8 weeks    PT Treatment/Interventions Aquatic Therapy;ADLs/Self Care Home Management;Cryotherapy;Electrical Stimulation;Biofeedback;Moist Heat;DME Instruction;Gait training;Stair training;Functional mobility training;Therapeutic activities;Therapeutic exercise;Balance training;Neuromuscular re-education;Manual techniques;Patient/family education;Orthotic Fit/Training;Dry needling;Taping;Vasopneumatic Device    PT Next Visit Plan Nu-step for endurance/strengthening.Progress strengthening HEP with weights if indicated. Work on balance and standing tolerance. Manual therapy as indicated for bilat knee.  Follow-up on aquatics    PT Home Exercise Plan Access Code: [email or web address].    Consulted and Agree with Plan of Care Patient;Family member/caregiver    Family Member Consulted Daughter and caregiver           Patient will benefit from skilled therapeutic intervention in order to improve the following deficits and impairments:  Abnormal  gait,Decreased range of motion,Difficulty walking,Decreased endurance,Decreased activity tolerance,Pain,Decreased balance,Hypomobility,Improper body mechanics,Decreased mobility,Decreased strength,Postural dysfunction  Visit Diagnosis: Muscle weakness (generalized)  Pain in right hip  Abnormal posture  Difficulty in walking, not elsewhere classified  Other abnormalities of gait and mobility     Problem List Patient Active Problem List   Diagnosis Date Noted  . Palliative care by specialist   . Goals of care, counseling/discussion   . DNR (do not resuscitate)   . Weakness   . Advanced directives, counseling/discussion   . Pressure injury of skin 10/18/2019  . Closed fracture of right distal femur ([place]) 10/17/2019  . Femur fracture, right ([place]) 09/25/2019  . Chronic diastolic CHF (congestive heart failure) ([place]) 09/25/2019  . HCAP (healthcare-associated pneumonia) 06/19/2019  . Encephalopathy 06/19/2019  . Lumbar degenerative disc disease   . Paroxysmal A-fib ([place])   . Hypomagnesemia   . C. difficile colitis   . Diarrhea 05/29/2019  . CHF (congestive heart failure) ([place]) 05/29/2019  . Recurrent falls 04/30/2019  . Pericardial effusion without cardiac tamponade 03/29/2019  . Hypoalbuminemia 03/27/2019  . Cardiac tamponade   . Pericarditis 03/06/2019  . Fever   . Septic shock ([place])   . Atypical chest pain   . Atrial fibrillation with rapid ventricular response ([place])   . Nausea 01/17/2019  . Acute diverticulitis 01/13/2019  . Chronic back pain 01/13/2019  . Tachycardia 01/13/2019  . Hypokalemia 01/13/2019  . Chronic low back pain 07/10/2018  . Influenza A 05/20/2017  . Severe sepsis ([place]) 05/20/2017  . Acute respiratory failure with hypoxia ([place]) 05/20/2017  . CKD (chronic kidney disease), stage III ([place]) 05/20/2017  . PAF (paroxysmal atrial fibrillation) ([place]) 05/20/2017  . HTN (hypertension) 05/20/2017  . Giant cell arteritis ([place]) 05/20/2017  . Coronary disease  05/20/2017  . Abnormal urinalysis 05/20/2017  . Osteoporosis 05/08/2016  . Herniated nucleus pulposus, L5-S1, right 11/04/2015  . Major depressive disorder, recurrent episode, moderate ([place]) 07/16/2013  . Abdominal pain, epigastric 06/22/2013  . Lumbar stenosis 04/25/2013  . Chronic insomnia 02/07/2013  . Elevated transaminase level 02/07/2013  . CVA (cerebral infarction) 10/29/2012  . Visual changes 10/28/2012  . Depression 10/28/2012  . Anxiety state 10/28/2012  . Anemia, unspecified 10/28/2012  . Nonspecific (abnormal) findings on radiological and other examination of gastrointestinal tract 01/25/2012  . Hyponatremia 01/24/2012  . Hematuria 01/24/2012  . Enteritis 12/24/2011  . Abdominal pain, other specified site 12/24/2011  . Leg pain, right 02/18/2011  . Temporal arteritis ([place]) 09/01/2010  . Hypertension 09/01/2010  . Hyperlipidemia 09/01/2010  . History of atrial fibrillation 09/01/2010  . CAD (coronary artery disease) 09/01/2010  . GERD (gastroesophageal reflux disease) 09/01/2010  . Insomnia 09/01/2010    Markeis Allman April Ma L Kaelen Brennan PT, DPT 05/20/2020, 12:44 PM  [facility] [address] [place], [place], [address] Phone: [phone]   Fax:  [phone]  Name: Betty Jackson MRN: [id number] Date of Birth: 1934/06/27

## 2020-05-22 ENCOUNTER — Ambulatory Visit (HOSPITAL_BASED_OUTPATIENT_CLINIC_OR_DEPARTMENT_OTHER): Payer: Medicare Other | Attending: Orthopedic Surgery | Admitting: Physical Therapy

## 2020-05-22 ENCOUNTER — Other Ambulatory Visit: Payer: Self-pay

## 2020-05-22 ENCOUNTER — Encounter (HOSPITAL_BASED_OUTPATIENT_CLINIC_OR_DEPARTMENT_OTHER): Payer: Self-pay | Admitting: Physical Therapy

## 2020-05-22 DIAGNOSIS — R293 Abnormal posture: Secondary | ICD-10-CM | POA: Diagnosis not present

## 2020-05-22 DIAGNOSIS — M6281 Muscle weakness (generalized): Secondary | ICD-10-CM | POA: Insufficient documentation

## 2020-05-22 DIAGNOSIS — R2689 Other abnormalities of gait and mobility: Secondary | ICD-10-CM | POA: Diagnosis not present

## 2020-05-22 DIAGNOSIS — R262 Difficulty in walking, not elsewhere classified: Secondary | ICD-10-CM | POA: Diagnosis not present

## 2020-05-22 DIAGNOSIS — M25551 Pain in right hip: Secondary | ICD-10-CM | POA: Diagnosis not present

## 2020-05-22 NOTE — Therapy (Signed)
[place], [place], [address] Phone: [phone]   Fax:  [phone]  Physical Therapy Treatment  Patient Details  Name: Betty Jackson MRN: [id number] Date of Birth: 03-15-1935 Referring Provider (PT): Altamese Higgins, MD   Encounter Date: 05/22/2020   PT End of Session - 05/22/20 1302    Visit Number 4    Number of Visits 25    Date for PT Re-Evaluation 07/08/20    Authorization Type [employer] Medicare    PT Start Time 1153    PT Stop Time 1235    PT Time Calculation (min) 42 min    Equipment Utilized During Treatment Gait belt    Activity Tolerance Patient tolerated treatment well    Behavior During Therapy [facility] for tasks assessed/performed           Past Medical History:  Diagnosis Date  . Acute respiratory failure with hypoxia ([place]) 05/20/2017  . Anemia, unspecified 10/28/2012  . Anxiety state, unspecified 10/28/2012  . CAD (coronary artery disease)    a. Stent RCA in [facility];  b. Cath approx 2009 - nonobs per pt report.  . Cataract    immature on the left eye  . Chronic insomnia 02/07/2013  . Chronic lower back pain    scoliosis  . CKD (chronic kidney disease) stage 3, GFR 30-59 ml/min (HCC)   . CVA (cerebral infarction) 10/29/2012  . DDD (degenerative disc disease)   . Depression   . Diverticulosis   . GERD (gastroesophageal reflux disease) 09/01/2010  . Hemorrhoids   . Herniated nucleus pulposus, L5-S1, right 11/04/2015  . Hyperlipidemia    takes Lipitor daily  . Hypertension    takes Amlodipine,Losartan,Metoprolol,and HCTZ daily  . Incontinence of urine   . Insomnia    takes Restoril nightly  . Lumbar stenosis 04/25/2013  . Major depressive disorder, recurrent episode, moderate ([place]) 07/16/2013  . Osteoporosis   . PAF (paroxysmal atrial fibrillation) ([place]) 2011   a. lone epidode in 2011 according to notes.  . Rheumatoid arthritis ([place])   . Scoliosis   . Sepsis ([place]) 05/2019  .  Temporal arteritis ([place]) 2011   a. followed @ [facility]; potential flareup 10/28/2012/notes 10/28/2012  . Vocal cord dysfunction    "they don't operate properly" (10/28/2012)    Past Surgical History:  Procedure Laterality Date  . ABDOMINAL HYSTERECTOMY  ~ 1984   vaginally  . BACK SURGERY  7-1yrs ago   X Stop  . BLADDER SUSPENSION  2001  . BREAST BIOPSY Right   . CATARACT EXTRACTION W/ INTRAOCULAR LENS IMPLANT Right ~ 08/2012  . CHEST TUBE INSERTION Left 03/08/2019   Procedure: Chest Tube Insertion;  Surgeon: Ivin Poot, MD;  Location: [place];  Service: Thoracic;  Laterality: Left;  . COLONOSCOPY  01/26/2012   Procedure: COLONOSCOPY;  Surgeon: Ladene Artist, MD,FACG;  Location: [facility] ENDOSCOPY;  Service: Endoscopy;  Laterality: N/A;  note the EGD is possible  . CORONARY ANGIOPLASTY WITH STENT PLACEMENT  2006   X 1 stent  . EPIDURAL BLOCK INJECTION    . ESOPHAGOGASTRODUODENOSCOPY  01/26/2012   Procedure: ESOPHAGOGASTRODUODENOSCOPY (EGD);  Surgeon: Ladene Artist, MD,FACG;  Location: [facility] ENDOSCOPY;  Service: Endoscopy;  Laterality: N/A;  . FINE NEEDLE ASPIRATION Right 09/26/2019   Procedure: FINE NEEDLE ASPIRATION;  Surgeon: Altamese [place], MD;  Location: [place];  Service: Orthopedics;  Laterality: Right;  . HEMIARTHROPLASTY HIP Right 2012  . IR EPIDUROGRAPHY  04/20/2019  . LAPAROSCOPIC CHOLECYSTECTOMY  2001  . LUMBAR FUSION  03/2013  . LUMBAR LAMINECTOMY/DECOMPRESSION MICRODISCECTOMY Right 11/04/2015   Procedure: Right Lumbar Five-Sacral One Microdiskectomy;  Surgeon: Kristeen Miss, MD;  Location: [place] NEURO ORS;  Service: Neurosurgery;  Laterality: Right;  Right L5-S1 Microdiskectomy  . moles removed that required stiches     one on leg and one on face  . ORIF FEMUR FRACTURE Right 09/26/2019   Procedure: OPEN REDUCTION INTERNAL FIXATION (ORIF) DISTAL FEMUR FRACTURE;  Surgeon: Altamese [place], MD;  Location: [place];  Service: Orthopedics;  Laterality: Right;  . ORIF FEMUR FRACTURE Right 10/17/2019    Procedure: OPEN REDUCTION INTERNAL FIXATION (ORIF) DISTAL FEMUR FRACTURE;  Surgeon: Altamese [place], MD;  Location: [place];  Service: Orthopedics;  Laterality: Right;  . SUBXYPHOID PERICARDIAL WINDOW N/A 03/08/2019   Procedure: SUBXYPHOID PERICARDIAL WINDOW;  Surgeon: Ivin Poot, MD;  Location: [place];  Service: Thoracic;  Laterality: N/A;  . TEE WITHOUT CARDIOVERSION N/A 03/08/2019   Procedure: TRANSESOPHAGEAL ECHOCARDIOGRAM (TEE);  Surgeon: Prescott Gum, [place], MD;  Location: [place];  Service: Thoracic;  Laterality: N/A;  . TEMPORAL ARTERY BIOPSY / LIGATION Bilateral 2011  . TONSILLECTOMY AND ADENOIDECTOMY     at age 65  . TUBAL LIGATION  ~ 1982  . X-STOP IMPLANTATION  ~ 2010   "lower back" (10/28/2012)    There were no vitals filed for this visit.   Subjective Assessment - 05/22/20 1213    Subjective Pt states she's been able to try some of the exercises. Pt reports she wasn't too sore after last session.    Pertinent History CAD, hypertension, history of temporal arteritis, atrial fibrillation, GERD, chronic back pain with multiple prior surgeries, osteoarthritis involving multiple joints    Limitations Standing    How long can you sit comfortably? N/a    How long can you stand comfortably? Not long    How long can you walk comfortably? 29' with RW (PLOF)    Patient Stated Goals Improve mobility and strength    Pain Onset More than a month ago   Since fall                            [place] Adult PT Treatment/Exercise - 05/22/20 0001      Ambulation/Gait   Ambulation/Gait Yes    Ambulation Distance (Feet) --   3x15' going between exercise machines; CGA initially but increased to min A with fatigue   Assistive device Rolling walker    Gait Pattern Step-through pattern;Decreased weight shift to right;Decreased step length - right;Decreased step length - left;Right flexed knee in stance;Left flexed knee in stance;Trunk flexed    Ambulation Surface Level;Indoor    Gait  Comments cues for increased hip, trunk, and knee extension. Increased trunk flexion with fatigue.      Knee/Hip Exercises: Aerobic   Nustep L6 x 6 min UE & LEs      Knee/Hip Exercises: Machines for Strengthening   Total Gym Leg Press 25# x8; 10# 2x8    Other Machine Hip abductor machine 10# 3x10      Knee/Hip Exercises: Seated   Sit to Sand without UE support;10 reps                  PT Education - 05/22/20 1303    Education Details Discussed expectations for her aquatic therapy session this Friday. Provided pt a map to give to her Friday caregiver.    Person(s) Educated Patient  Methods Explanation;Handout    Comprehension Verbalized understanding            PT Short Term Goals - 05/13/20 1406      PT SHORT TERM GOAL #1   Title Pt will be independent with HEP    Time 4    Period Weeks    Status New    Target Date 06/10/20      PT SHORT TERM GOAL #2   Title Pt will be able to perform 5x STS in <15 sec to demo improved functional strength    Baseline 19 sec    Time 4    Period Weeks    Status New    Target Date 06/10/20      PT SHORT TERM GOAL #3   Title Pt will be able to decrease TUG score by at least 10 sec to demo MCID    Baseline 40 sec    Time 4    Period Weeks    Status New    Target Date 06/10/20      PT SHORT TERM GOAL #4   Title Pt will be fully independent with transfers    Baseline Requires min A at times    Time 4    Period Weeks    Status New    Target Date 06/10/20             PT Long Term Goals - 05/13/20 1411      PT LONG TERM GOAL #1   Title Pt will be independent with maintaining mobility at home    Time 8    Period Weeks    Status New    Target Date 07/08/20      PT LONG TERM GOAL #2   Title Pt will have improved 5 x STS to </=13 sec to demo decreased fall risk    Baseline 19 sec    Time 8    Period Weeks    Status New    Target Date 07/08/20      PT LONG TERM GOAL #3   Title Pt will have improved TUG score  to </=20 sec    Baseline 40 sec    Time 8    Period Weeks    Status New    Target Date 07/08/20      PT LONG TERM GOAL #4   Title Pt will have improved Berg Score to at least 37/56 to demo [place] for decreased fall risk    Baseline 29/56    Time 8    Period Weeks    Status New    Target Date 07/08/20                 Plan - 05/22/20 1246    Clinical Impression Statement Treatment session focused on continued strengthening and gait training. Pt placed in machine weights this session -- tolerated well but fatigues easily. Heavy focus on quad strengthening. Continued to work on improving hip/trunk and knee extension during gait and standing. Provided pt information to prep her for aquatic therapy.    Personal Factors and Comorbidities Age;Fitness;Comorbidity 1;Comorbidity 2;Comorbidity 3+;Time since onset of injury/illness/exacerbation    Comorbidities CAD, hypertension, history of temporal arteritis, atrial fibrillation, GERD, chronic back pain with multiple prior surgeries, osteoarthritis involving multiple joints    Examination-Activity Limitations Bed Mobility;Bend;Locomotion Level;Transfers;Dressing;Stand;Squat    Examination-Participation Restrictions Community Activity;Cleaning;Shop;Laundry    Stability/Clinical Decision Making Evolving/Moderate complexity    Rehab Potential Good    PT Frequency 3x /  week   2x/wk on land; 1x/wk aquatics   PT Duration 8 weeks    PT Treatment/Interventions Aquatic Therapy;ADLs/Self Care Home Management;Cryotherapy;Electrical Stimulation;Biofeedback;Moist Heat;DME Instruction;Gait training;Stair training;Functional mobility training;Therapeutic activities;Therapeutic exercise;Balance training;Neuromuscular re-education;Manual techniques;Patient/family education;Orthotic Fit/Training;Dry needling;Taping;Vasopneumatic Device    PT Next Visit Plan Pt to see aquatics. Progress strengthening for hip extensors, hip abductors, and quads. Work on balance,  gait, and standing tolerance. Manual therapy as indicated for bilat knee.    PT Home Exercise Plan Access Code: [email or web address]    Consulted and Agree with Plan of Care Patient;Family member/caregiver    Family Member Consulted Daughter and caregiver           Patient will benefit from skilled therapeutic intervention in order to improve the following deficits and impairments:  Abnormal gait,Decreased range of motion,Difficulty walking,Decreased endurance,Decreased activity tolerance,Pain,Decreased balance,Hypomobility,Improper body mechanics,Decreased mobility,Decreased strength,Postural dysfunction  Visit Diagnosis: Muscle weakness (generalized)  Pain in right hip  Abnormal posture  Difficulty in walking, not elsewhere classified  Other abnormalities of gait and mobility     Problem List Patient Active Problem List   Diagnosis Date Noted  . Palliative care by specialist   . Goals of care, counseling/discussion   . DNR (do not resuscitate)   . Weakness   . Advanced directives, counseling/discussion   . Pressure injury of skin 10/18/2019  . Closed fracture of right distal femur ([place]) 10/17/2019  . Femur fracture, right ([place]) 09/25/2019  . Chronic diastolic CHF (congestive heart failure) ([place]) 09/25/2019  . HCAP (healthcare-associated pneumonia) 06/19/2019  . Encephalopathy 06/19/2019  . Lumbar degenerative disc disease   . Paroxysmal A-fib ([place])   . Hypomagnesemia   . C. difficile colitis   . Diarrhea 05/29/2019  . CHF (congestive heart failure) ([place]) 05/29/2019  . Recurrent falls 04/30/2019  . Pericardial effusion without cardiac tamponade 03/29/2019  . Hypoalbuminemia 03/27/2019  . Cardiac tamponade   . Pericarditis 03/06/2019  . Fever   . Septic shock ([place])   . Atypical chest pain   . Atrial fibrillation with rapid ventricular response ([place])   . Nausea 01/17/2019  . Acute diverticulitis 01/13/2019  . Chronic back pain 01/13/2019  . Tachycardia 01/13/2019  .  Hypokalemia 01/13/2019  . Chronic low back pain 07/10/2018  . Influenza A 05/20/2017  . Severe sepsis ([place]) 05/20/2017  . Acute respiratory failure with hypoxia ([place]) 05/20/2017  . CKD (chronic kidney disease), stage III ([place]) 05/20/2017  . PAF (paroxysmal atrial fibrillation) ([place]) 05/20/2017  . HTN (hypertension) 05/20/2017  . Giant cell arteritis ([place]) 05/20/2017  . Coronary disease 05/20/2017  . Abnormal urinalysis 05/20/2017  . Osteoporosis 05/08/2016  . Herniated nucleus pulposus, L5-S1, right 11/04/2015  . Major depressive disorder, recurrent episode, moderate ([place]) 07/16/2013  . Abdominal pain, epigastric 06/22/2013  . Lumbar stenosis 04/25/2013  . Chronic insomnia 02/07/2013  . Elevated transaminase level 02/07/2013  . CVA (cerebral infarction) 10/29/2012  . Visual changes 10/28/2012  . Depression 10/28/2012  . Anxiety state 10/28/2012  . Anemia, unspecified 10/28/2012  . Nonspecific (abnormal) findings on radiological and other examination of gastrointestinal tract 01/25/2012  . Hyponatremia 01/24/2012  . Hematuria 01/24/2012  . Enteritis 12/24/2011  . Abdominal pain, other specified site 12/24/2011  . Leg pain, right 02/18/2011  . Temporal arteritis ([place]) 09/01/2010  . Hypertension 09/01/2010  . Hyperlipidemia 09/01/2010  . History of atrial fibrillation 09/01/2010  . CAD (coronary artery disease) 09/01/2010  . GERD (gastroesophageal reflux disease) 09/01/2010  . Insomnia 09/01/2010    Betty Jackson  PT, DPT 05/22/2020, 1:06 PM  [facility] [place], [place], [address] Phone: [phone]   Fax:  [phone]  Name: Betty Jackson MRN: [id number] Date of Birth: 27-Jun-1934

## 2020-05-23 ENCOUNTER — Other Ambulatory Visit: Payer: Self-pay

## 2020-05-24 ENCOUNTER — Ambulatory Visit (INDEPENDENT_AMBULATORY_CARE_PROVIDER_SITE_OTHER): Payer: Medicare Other | Admitting: Family Medicine

## 2020-05-24 ENCOUNTER — Ambulatory Visit (HOSPITAL_BASED_OUTPATIENT_CLINIC_OR_DEPARTMENT_OTHER): Payer: Medicare Other | Admitting: Physical Therapy

## 2020-05-24 ENCOUNTER — Encounter: Payer: Self-pay | Admitting: Family Medicine

## 2020-05-24 ENCOUNTER — Encounter (HOSPITAL_BASED_OUTPATIENT_CLINIC_OR_DEPARTMENT_OTHER): Payer: Self-pay | Admitting: Physical Therapy

## 2020-05-24 VITALS — BP 144/70 | HR 78 | Ht 64.0 in | Wt 158.0 lb

## 2020-05-24 DIAGNOSIS — M25551 Pain in right hip: Secondary | ICD-10-CM

## 2020-05-24 DIAGNOSIS — I480 Paroxysmal atrial fibrillation: Secondary | ICD-10-CM

## 2020-05-24 DIAGNOSIS — M6281 Muscle weakness (generalized): Secondary | ICD-10-CM | POA: Diagnosis not present

## 2020-05-24 DIAGNOSIS — R262 Difficulty in walking, not elsewhere classified: Secondary | ICD-10-CM

## 2020-05-24 DIAGNOSIS — I10 Essential (primary) hypertension: Secondary | ICD-10-CM | POA: Diagnosis not present

## 2020-05-24 DIAGNOSIS — R293 Abnormal posture: Secondary | ICD-10-CM

## 2020-05-24 DIAGNOSIS — R059 Cough, unspecified: Secondary | ICD-10-CM | POA: Insufficient documentation

## 2020-05-24 DIAGNOSIS — R21 Rash and other nonspecific skin eruption: Secondary | ICD-10-CM | POA: Insufficient documentation

## 2020-05-24 DIAGNOSIS — E559 Vitamin D deficiency, unspecified: Secondary | ICD-10-CM | POA: Insufficient documentation

## 2020-05-24 DIAGNOSIS — R2689 Other abnormalities of gait and mobility: Secondary | ICD-10-CM | POA: Diagnosis not present

## 2020-05-24 DIAGNOSIS — M8580 Other specified disorders of bone density and structure, unspecified site: Secondary | ICD-10-CM | POA: Insufficient documentation

## 2020-05-24 DIAGNOSIS — R0600 Dyspnea, unspecified: Secondary | ICD-10-CM | POA: Insufficient documentation

## 2020-05-24 DIAGNOSIS — H469 Unspecified optic neuritis: Secondary | ICD-10-CM | POA: Insufficient documentation

## 2020-05-24 LAB — BASIC METABOLIC PANEL
BUN: 13 mg/dL (ref 6–23)
CO2: 33 mEq/L — ABNORMAL HIGH (ref 19–32)
Calcium: 9.3 mg/dL (ref 8.4–10.5)
Chloride: 100 mEq/L (ref 96–112)
Creatinine, Ser: 0.86 mg/dL (ref 0.40–1.20)
GFR: 62.27 mL/min (ref >60.00)
Glucose, Bld: 124 mg/dL — ABNORMAL HIGH (ref 70–99)
Potassium: 3.8 mEq/L (ref 3.5–5.1)
Sodium: 140 mEq/L (ref 135–145)

## 2020-05-24 MED ORDER — LOSARTAN POTASSIUM 50 MG PO TABS: 50.0000 mg | ORAL_TABLET | Freq: Every day | ORAL | 3 refills | Status: DC

## 2020-05-24 NOTE — Therapy (Signed)
[place], [place], [address] Phone: [phone]   Fax:  [phone]  Physical Therapy Treatment  Patient Details  Name: Betty Jackson MRN: [id number] Date of Birth: Mar 10, 1935 Referring Provider (PT): Altamese Roberts, MD   Encounter Date: 05/24/2020   PT End of Session - 05/24/20 5809    Visit Number 5    Number of Visits 25    Date for PT Re-Evaluation 07/08/20    Authorization Type [employer] Medicare    PT Start Time 1447    PT Stop Time 1529    PT Time Calculation (min) 42 min    Equipment Utilized During Treatment Other (comment)    Activity Tolerance Patient tolerated treatment well    Behavior During Therapy [facility] for tasks assessed/performed           Past Medical History:  Diagnosis Date  . Acute respiratory failure with hypoxia ([place]) 05/20/2017  . Anemia, unspecified 10/28/2012  . Anxiety state, unspecified 10/28/2012  . CAD (coronary artery disease)    a. Stent RCA in [facility];  b. Cath approx 2009 - nonobs per pt report.  . Cataract    immature on the left eye  . Chronic insomnia 02/07/2013  . Chronic lower back pain    scoliosis  . CKD (chronic kidney disease) stage 3, GFR 30-59 ml/min (HCC)   . CVA (cerebral infarction) 10/29/2012  . DDD (degenerative disc disease)   . Depression   . Diverticulosis   . GERD (gastroesophageal reflux disease) 09/01/2010  . Hemorrhoids   . Herniated nucleus pulposus, L5-S1, right 11/04/2015  . Hyperlipidemia    takes Lipitor daily  . Hypertension    takes Amlodipine,Losartan,Metoprolol,and HCTZ daily  . Incontinence of urine   . Insomnia    takes Restoril nightly  . Lumbar stenosis 04/25/2013  . Major depressive disorder, recurrent episode, moderate ([place]) 07/16/2013  . Osteoporosis   . PAF (paroxysmal atrial fibrillation) ([place]) 2011   a. lone epidode in 2011 according to notes.  . Rheumatoid arthritis ([place])   . Scoliosis   . Sepsis ([place]) 05/2019   . Temporal arteritis ([place]) 2011   a. followed @ [facility]; potential flareup 10/28/2012/notes 10/28/2012  . Vocal cord dysfunction    "they don't operate properly" (10/28/2012)    Past Surgical History:  Procedure Laterality Date  . ABDOMINAL HYSTERECTOMY  ~ 1984   vaginally  . BACK SURGERY  7-73yrs ago   X Stop  . BLADDER SUSPENSION  2001  . BREAST BIOPSY Right   . CATARACT EXTRACTION W/ INTRAOCULAR LENS IMPLANT Right ~ 08/2012  . CHEST TUBE INSERTION Left 03/08/2019   Procedure: Chest Tube Insertion;  Surgeon: Ivin Poot, MD;  Location: [place];  Service: Thoracic;  Laterality: Left;  . COLONOSCOPY  01/26/2012   Procedure: COLONOSCOPY;  Surgeon: Ladene Artist, MD,FACG;  Location: [facility] ENDOSCOPY;  Service: Endoscopy;  Laterality: N/A;  note the EGD is possible  . CORONARY ANGIOPLASTY WITH STENT PLACEMENT  2006   X 1 stent  . EPIDURAL BLOCK INJECTION    . ESOPHAGOGASTRODUODENOSCOPY  01/26/2012   Procedure: ESOPHAGOGASTRODUODENOSCOPY (EGD);  Surgeon: Ladene Artist, MD,FACG;  Location: [facility] ENDOSCOPY;  Service: Endoscopy;  Laterality: N/A;  . FINE NEEDLE ASPIRATION Right 09/26/2019   Procedure: FINE NEEDLE ASPIRATION;  Surgeon: Altamese Portola, MD;  Location: [place];  Service: Orthopedics;  Laterality: Right;  . HEMIARTHROPLASTY HIP Right 2012  . IR EPIDUROGRAPHY  04/20/2019  . LAPAROSCOPIC CHOLECYSTECTOMY  2001  . LUMBAR FUSION  03/2013  . LUMBAR LAMINECTOMY/DECOMPRESSION MICRODISCECTOMY Right 11/04/2015   Procedure: Right Lumbar Five-Sacral One Microdiskectomy;  Surgeon: Kristeen Miss, MD;  Location: [place] NEURO ORS;  Service: Neurosurgery;  Laterality: Right;  Right L5-S1 Microdiskectomy  . moles removed that required stiches     one on leg and one on face  . ORIF FEMUR FRACTURE Right 09/26/2019   Procedure: OPEN REDUCTION INTERNAL FIXATION (ORIF) DISTAL FEMUR FRACTURE;  Surgeon: Altamese Shiloh, MD;  Location: [place];  Service: Orthopedics;  Laterality: Right;  . ORIF FEMUR FRACTURE Right 10/17/2019    Procedure: OPEN REDUCTION INTERNAL FIXATION (ORIF) DISTAL FEMUR FRACTURE;  Surgeon: Altamese Suffern, MD;  Location: [place];  Service: Orthopedics;  Laterality: Right;  . SUBXYPHOID PERICARDIAL WINDOW N/A 03/08/2019   Procedure: SUBXYPHOID PERICARDIAL WINDOW;  Surgeon: Ivin Poot, MD;  Location: [place];  Service: Thoracic;  Laterality: N/A;  . TEE WITHOUT CARDIOVERSION N/A 03/08/2019   Procedure: TRANSESOPHAGEAL ECHOCARDIOGRAM (TEE);  Surgeon: Prescott Gum, Collier Salina, MD;  Location: [place];  Service: Thoracic;  Laterality: N/A;  . TEMPORAL ARTERY BIOPSY / LIGATION Bilateral 2011  . TONSILLECTOMY AND ADENOIDECTOMY     at age 47  . TUBAL LIGATION  ~ 1982  . X-STOP IMPLANTATION  ~ 2010   "lower back" (10/28/2012)    There were no vitals filed for this visit.   Subjective Assessment - 05/24/20 1605    Subjective Pt reports no new changes since last visit.  She can not tell a change in strength yet.  Rt knee is no longer bothering her.    Patient is accompained by: --   caregiver   Pertinent History CAD, hypertension, history of temporal arteritis, atrial fibrillation, GERD, chronic back pain with multiple prior surgeries, osteoarthritis involving multiple joints    Limitations Standing    How long can you sit comfortably? N/a    How long can you stand comfortably? Not long    How long can you walk comfortably? 9' with RW (PLOF)    Patient Stated Goals Improve mobility and strength    Currently in Pain? No/denies    Pain Score 0-No pain    Pain Onset More than a month ago   Since fall          Pt seen for aquatic therapy today.  Treatment took place in water 3.5-4 ft in depth at the [facility]. Temp of water was 91 deg.  Pt entered/exited the pool via chair lift, with assistance to/from wheelchair from caregiver.  Treatment:  Forward gait 10-12 ft x 3, with Hand held assist (pt holding therapist forearms); cues for more upright posture.  Sit to stand x 8 from pool bench (in water) Side  stepping holding ledge x 10 ft Rt/Lt  LAQ x 10 each leg  Standing lumbar ext x 3 (limited range and tolerance)  Hip ext x 10 each leg, UE support on ledge Hip flex x 10 each leg, UE support on ledge Squat with water dumbells x 5 Stairs forward ascend/ retro descend with BUE support on rail x 5 reps each LE.  Supported float in supine with gentle sway for side body stretch and decompression of spine.  Pt requires buoyancy for support and to offload joints with strengthening exercises. Viscosity of the water is needed for resistance of strengthening; water current perturbations provides challenge to standing balance unsupported, requiring increased core activation.     PT Short Term Goals - 05/13/20 1406  PT SHORT TERM GOAL #1   Title Pt will be independent with HEP    Time 4    Period Weeks    Status New    Target Date 06/10/20      PT SHORT TERM GOAL #2   Title Pt will be able to perform 5x STS in <15 sec to demo improved functional strength    Baseline 19 sec    Time 4    Period Weeks    Status New    Target Date 06/10/20      PT SHORT TERM GOAL #3   Title Pt will be able to decrease TUG score by at least 10 sec to demo MCID    Baseline 40 sec    Time 4    Period Weeks    Status New    Target Date 06/10/20      PT SHORT TERM GOAL #4   Title Pt will be fully independent with transfers    Baseline Requires min A at times    Time 4    Period Weeks    Status New    Target Date 06/10/20             PT Long Term Goals - 05/13/20 1411      PT LONG TERM GOAL #1   Title Pt will be independent with maintaining mobility at home    Time 8    Period Weeks    Status New    Target Date 07/08/20      PT LONG TERM GOAL #2   Title Pt will have improved 5 x STS to </=13 sec to demo decreased fall risk    Baseline 19 sec    Time 8    Period Weeks    Status New    Target Date 07/08/20      PT LONG TERM GOAL #3   Title Pt will have improved TUG score to </=20 sec     Baseline 40 sec    Time 8    Period Weeks    Status New    Target Date 07/08/20      PT LONG TERM GOAL #4   Title Pt will have improved Berg Score to at least 37/56 to demo [place] for decreased fall risk    Baseline 29/56    Time 8    Period Weeks    Status New    Target Date 07/08/20                 Plan - 05/24/20 1606    Clinical Impression Statement Pt given hand held assistance for short gait trials in pool, with frequent cues for more upright posture. Pt has limited lumbar ext.  Pt reported increase in lumbar pain to 7/10 when exercising with more upright posture.  Pt able to ambulate up 2 steps (under waist to chest deep water) with bilat UE support on rails; voiced goal of eventually entering / exiting pool via stairs when strength in LE has iimproved.  Pt reported reduction of LBP to 3-4/10 at end of session after floating in the decompression position for 3 min.  Goals are ongoing.    Personal Factors and Comorbidities Age;Fitness;Comorbidity 1;Comorbidity 2;Comorbidity 3+;Time since onset of injury/illness/exacerbation    Comorbidities CAD, hypertension, history of temporal arteritis, atrial fibrillation, GERD, chronic back pain with multiple prior surgeries, osteoarthritis involving multiple joints    Examination-Activity Limitations Bed Mobility;Bend;Locomotion Level;Transfers;Dressing;Stand;Squat    Examination-Participation Restrictions Community Activity;Cleaning;Shop;Laundry  Stability/Clinical Decision Making Evolving/Moderate complexity    Rehab Potential Good    PT Frequency 3x / week   2x/wk on land; 1x/wk aquatics   PT Duration 8 weeks    PT Treatment/Interventions Aquatic Therapy;ADLs/Self Care Home Management;Cryotherapy;Electrical Stimulation;Biofeedback;Moist Heat;DME Instruction;Gait training;Stair training;Functional mobility training;Therapeutic activities;Therapeutic exercise;Balance training;Neuromuscular re-education;Manual techniques;Patient/family  education;Orthotic Fit/Training;Dry needling;Taping;Vasopneumatic Device    PT Next Visit Plan Progress strengthening for hip extensors, hip abductors, and quads. Work on balance, gait, and standing tolerance. Try prone position (on land) for hip flexor stretch/ neutral spine.  Manual therapy as indicated for bilat knee.    PT Home Exercise Plan Access Code: [email or web address]    Consulted and Agree with Plan of Care Patient;Family member/caregiver    Family Member Consulted Daughter and caregiver           Patient will benefit from skilled therapeutic intervention in order to improve the following deficits and impairments:  Abnormal gait,Decreased range of motion,Difficulty walking,Decreased endurance,Decreased activity tolerance,Pain,Decreased balance,Hypomobility,Improper body mechanics,Decreased mobility,Decreased strength,Postural dysfunction  Visit Diagnosis: Muscle weakness (generalized)  Pain in right hip  Abnormal posture  Difficulty in walking, not elsewhere classified     Problem List Patient Active Problem List   Diagnosis Date Noted  . Cough 05/24/2020  . Dyspnea 05/24/2020  . Optic neuritis 05/24/2020  . Osteopenia 05/24/2020  . Rash 05/24/2020  . Vitamin D deficiency 05/24/2020  . Palliative care by specialist   . Goals of care, counseling/discussion   . DNR (do not resuscitate)   . Weakness   . Advanced directives, counseling/discussion   . Pressure injury of skin 10/18/2019  . Closed fracture of right distal femur ([place]) 10/17/2019  . Femur fracture, right ([place]) 09/25/2019  . Chronic diastolic CHF (congestive heart failure) ([place]) 09/25/2019  . HCAP (healthcare-associated pneumonia) 06/19/2019  . Encephalopathy 06/19/2019  . Lumbar degenerative disc disease   . Paroxysmal A-fib ([place])   . Hypomagnesemia   . C. difficile colitis   . Diarrhea 05/29/2019  . CHF (congestive heart failure) ([place]) 05/29/2019  . Recurrent falls 04/30/2019  . Pericardial effusion without  cardiac tamponade 03/29/2019  . Hypoalbuminemia 03/27/2019  . Cardiac tamponade   . Pericarditis 03/06/2019  . Fever   . Septic shock ([place])   . Atypical chest pain   . Atrial fibrillation with rapid ventricular response ([place])   . Nausea 01/17/2019  . Acute diverticulitis 01/13/2019  . Chronic back pain 01/13/2019  . Tachycardia 01/13/2019  . Hypokalemia 01/13/2019  . Chronic low back pain 07/10/2018  . Influenza A 05/20/2017  . Severe sepsis ([place]) 05/20/2017  . Acute respiratory failure with hypoxia ([place]) 05/20/2017  . CKD (chronic kidney disease), stage III ([place]) 05/20/2017  . PAF (paroxysmal atrial fibrillation) ([place]) 05/20/2017  . HTN (hypertension) 05/20/2017  . Giant cell arteritis ([place]) 05/20/2017  . Coronary disease 05/20/2017  . Abnormal urinalysis 05/20/2017  . Osteoporosis 05/08/2016  . Herniated nucleus pulposus, L5-S1, right 11/04/2015  . Major depressive disorder, recurrent episode, moderate ([place]) 07/16/2013  . Abdominal pain, epigastric 06/22/2013  . Lumbar stenosis 04/25/2013  . Chronic insomnia 02/07/2013  . Elevated transaminase level 02/07/2013  . CVA (cerebral infarction) 10/29/2012  . Visual changes 10/28/2012  . Depression 10/28/2012  . Anxiety state 10/28/2012  . Anemia, unspecified 10/28/2012  . Nonspecific (abnormal) findings on radiological and other examination of gastrointestinal tract 01/25/2012  . Hyponatremia 01/24/2012  . Hematuria 01/24/2012  . Enteritis 12/24/2011  . Abdominal pain, other specified site 12/24/2011  . Leg pain, right 02/18/2011  .  Rheumatoid arthritis ([place]) 02/04/2011  . Temporal arteritis ([place]) 09/01/2010  . Hypertension 09/01/2010  . Hyperlipidemia 09/01/2010  . History of atrial fibrillation 09/01/2010  . CAD (coronary artery disease) 09/01/2010  . GERD (gastroesophageal reflux disease) 09/01/2010  . Insomnia 09/01/2010   Kerin Perna, PTA 05/24/20 4:18 PM  [place] [address] [place], [place], [address] Phone: [phone]   Fax:  [phone]  Name: LIVVY SPILMAN MRN: [id number] Date of Birth: [birth year]

## 2020-05-24 NOTE — Progress Notes (Signed)
Established Patient Office Visit  Subjective:  Patient ID: Betty Jackson, female    DOB: [birth year]  Age: 86 y.o. MRN: [id number]  CC:  Chief Complaint  Patient presents with  . Hypertension    HPI Betty Jackson presents for concerns for severe elevated blood pressure 2 days ago.  Her chronic problems include history of hypertension, temporal arteritis, atrial fibrillation, CAD, history of diastolic heart failure, history of CVA, rheumatoid arthritis, osteoporosis, chronic kidney disease.  She has 24/7 care at home.  They relate that Wednesday she went for physical therapy.  When she got back she ate some yogurt and following that had episode of vomiting and diarrhea.  This was a solitary episode.  Denied any abdominal pain.  No chest pain.  No fever.  No vomiting or diarrhea since then.  Later that day she had some cold sweats and was very lethargic.  She took about a 1-1/2-hour nap and when she woke up family noted that she looked "pale ".  They took home blood pressure and got reading of 240/135.  They called EMS and EMS obtained systolic reading around 502.  She denied any headaches, dyspnea, chest pain.  She was not taken in for further assessment.  Yesterday her systolic blood pressures ranged 140-160.  She is currently on metoprolol 50 mg twice daily and Cardizem CD 240 mg daily and Lasix 40 mg daily.  She states she has not had any dietary changes.  No alcohol use.  She does have severe chronic back pain and is on fentanyl patch but states her pain is doing much better overall.  She is sleeping better at night.  Denies any dysuria.  No further episodes of nausea or vomiting.  Past Medical History:  Diagnosis Date  . Acute respiratory failure with hypoxia ([place]) 05/20/2017  . Anemia, unspecified 10/28/2012  . Anxiety state, unspecified 10/28/2012  . CAD (coronary artery disease)    a. Stent RCA in [facility];  b. Cath approx 2009 - nonobs per pt report.  . Cataract    immature on  the left eye  . Chronic insomnia 02/07/2013  . Chronic lower back pain    scoliosis  . CKD (chronic kidney disease) stage 3, GFR 30-59 ml/min (HCC)   . CVA (cerebral infarction) 10/29/2012  . DDD (degenerative disc disease)   . Depression   . Diverticulosis   . GERD (gastroesophageal reflux disease) 09/01/2010  . Hemorrhoids   . Herniated nucleus pulposus, L5-S1, right 11/04/2015  . Hyperlipidemia    takes Lipitor daily  . Hypertension    takes Amlodipine,Losartan,Metoprolol,and HCTZ daily  . Incontinence of urine   . Insomnia    takes Restoril nightly  . Lumbar stenosis 04/25/2013  . Major depressive disorder, recurrent episode, moderate ([place]) 07/16/2013  . Osteoporosis   . PAF (paroxysmal atrial fibrillation) ([place]) 2011   a. lone epidode in 2011 according to notes.  . Rheumatoid arthritis ([place])   . Scoliosis   . Sepsis ([place]) 05/2019  . Temporal arteritis ([place]) 2011   a. followed @ [facility]; potential flareup 10/28/2012/notes 10/28/2012  . Vocal cord dysfunction    "they don't operate properly" (10/28/2012)    Past Surgical History:  Procedure Laterality Date  . ABDOMINAL HYSTERECTOMY  ~ 1984   vaginally  . BACK SURGERY  7-67yrs ago   X Stop  . BLADDER SUSPENSION  2001  . BREAST BIOPSY Right   . CATARACT EXTRACTION W/ INTRAOCULAR LENS IMPLANT Right ~ 08/2012  .  CHEST TUBE INSERTION Left 03/08/2019   Procedure: Chest Tube Insertion;  Surgeon: Ivin Poot, MD;  Location: [place];  Service: Thoracic;  Laterality: Left;  . COLONOSCOPY  01/26/2012   Procedure: COLONOSCOPY;  Surgeon: Ladene Artist, MD,FACG;  Location: [facility] ENDOSCOPY;  Service: Endoscopy;  Laterality: N/A;  note the EGD is possible  . CORONARY ANGIOPLASTY WITH STENT PLACEMENT  2006   X 1 stent  . EPIDURAL BLOCK INJECTION    . ESOPHAGOGASTRODUODENOSCOPY  01/26/2012   Procedure: ESOPHAGOGASTRODUODENOSCOPY (EGD);  Surgeon: Ladene Artist, MD,FACG;  Location: [facility] ENDOSCOPY;  Service: Endoscopy;  Laterality: N/A;  . FINE NEEDLE  ASPIRATION Right 09/26/2019   Procedure: FINE NEEDLE ASPIRATION;  Surgeon: Altamese Caswell, MD;  Location: [place];  Service: Orthopedics;  Laterality: Right;  . HEMIARTHROPLASTY HIP Right 2012  . IR EPIDUROGRAPHY  04/20/2019  . LAPAROSCOPIC CHOLECYSTECTOMY  2001  . LUMBAR FUSION  03/2013  . LUMBAR LAMINECTOMY/DECOMPRESSION MICRODISCECTOMY Right 11/04/2015   Procedure: Right Lumbar Five-Sacral One Microdiskectomy;  Surgeon: Kristeen Miss, MD;  Location: [place] NEURO ORS;  Service: Neurosurgery;  Laterality: Right;  Right L5-S1 Microdiskectomy  . moles removed that required stiches     one on leg and one on face  . ORIF FEMUR FRACTURE Right 09/26/2019   Procedure: OPEN REDUCTION INTERNAL FIXATION (ORIF) DISTAL FEMUR FRACTURE;  Surgeon: Altamese Chalmette, MD;  Location: [place];  Service: Orthopedics;  Laterality: Right;  . ORIF FEMUR FRACTURE Right 10/17/2019   Procedure: OPEN REDUCTION INTERNAL FIXATION (ORIF) DISTAL FEMUR FRACTURE;  Surgeon: Altamese Woodfield, MD;  Location: [place];  Service: Orthopedics;  Laterality: Right;  . SUBXYPHOID PERICARDIAL WINDOW N/A 03/08/2019   Procedure: SUBXYPHOID PERICARDIAL WINDOW;  Surgeon: Ivin Poot, MD;  Location: [place];  Service: Thoracic;  Laterality: N/A;  . TEE WITHOUT CARDIOVERSION N/A 03/08/2019   Procedure: TRANSESOPHAGEAL ECHOCARDIOGRAM (TEE);  Surgeon: Prescott Gum, Collier Salina, MD;  Location: [place];  Service: Thoracic;  Laterality: N/A;  . TEMPORAL ARTERY BIOPSY / LIGATION Bilateral 2011  . TONSILLECTOMY AND ADENOIDECTOMY     at age 50  . TUBAL LIGATION  ~ 1982  . X-STOP IMPLANTATION  ~ 2010   "lower back" (10/28/2012)    Family History  Problem Relation Age of Onset  . Brain cancer Mother 4  . Heart attack Father   . Breast cancer Daughter 12  . Heart disease Other   . Hypertension Other   . Arthritis Neg Hx   . Colon cancer Neg Hx   . Osteoporosis Neg Hx     Social History   Socioeconomic History  . Marital status: Married    Spouse name: Not on file  .  Number of children: 3  . Years of education: Not on file  . Highest education level: Not on file  Occupational History  . Occupation: Retired Cabin crew  Tobacco Use  . Smoking status: Never Smoker  . Smokeless tobacco: Never Used  Vaping Use  . Vaping Use: Never used  Substance and Sexual Activity  . Alcohol use: Yes    Comment: socially  . Drug use: No  . Sexual activity: Not on file  Other Topics Concern  . Not on file  Social History Narrative   Lives in [place] with her husband.  Retired Cabin crew.   Social Determinants of Health   Financial Resource Strain: Not on file  Food Insecurity: Not on file  Transportation Needs: Not on file  Physical Activity: Not on file  Stress: Not on file  Social Connections: Not on file  Intimate Partner Violence: Not on file    Outpatient Medications Prior to Visit  Medication Sig Dispense Refill  . ALPRAZolam (XANAX) 0.5 MG tablet Take 1 tablet (0.5 mg total) by mouth at bedtime as needed for sleep. (Patient not taking: Reported on 03/20/2020) 7 tablet 0  . cholecalciferol (VITAMIN D) 25 MCG tablet Take 2 tablets (2,000 Units total) by mouth 2 (two) times daily.    Marland Kitchen diltiazem (CARDIZEM CD) 240 MG 24 hr capsule Take 1 capsule (240 mg total) by mouth daily. 90 capsule 3  . ELIQUIS 5 MG TABS tablet TAKE 1 TABLET BY MOUTH TWICE A DAY 60 tablet 5  . escitalopram (LEXAPRO) 10 MG tablet Take 1 tablet (10 mg total) by mouth daily. 90 tablet 1  . famotidine (PEPCID) 20 MG tablet Take 1 tablet (20 mg total) by mouth daily. 30 tablet 1  . fentaNYL (DURAGESIC) 25 MCG/HR Place 1 patch onto the skin every 3 (three) days. 10 patch 0  . ferrous sulfate 325 (65 FE) MG tablet Take 1 tablet (325 mg total) by mouth 2 (two) times daily with a meal. (Patient not taking: Reported on 03/20/2020) 60 tablet 3  . furosemide (LASIX) 40 MG tablet Take 1 tablet (40 mg total) by mouth daily. 90 tablet 1  . HYDROcodone-acetaminophen (NORCO) 10-325 MG tablet Take one  tablet by mouth every 8 hours as needed for breakthrough pain. 30 tablet 0  . LACTOBACILLUS PO Take 2 capsules by mouth 3 (three) times daily.    . metoprolol tartrate (LOPRESSOR) 50 MG tablet Take 1 tablet (50 mg total) by mouth 2 (two) times daily. 30 tablet 0  . Multiple Vitamin (MULTIVITAMIN WITH MINERALS) TABS tablet Take 1 tablet by mouth daily. 60 tablet 0  . potassium chloride (KLOR-CON) 10 MEQ tablet Take 1 tablet (10 mEq total) by mouth daily. 90 tablet 2  . predniSONE (DELTASONE) 10 MG tablet Take 1 tablet (10 mg total) by mouth daily with breakfast. 30 tablet 0  . senna-docusate (SENOKOT-S) 8.6-50 MG tablet Take 1 tablet by mouth at bedtime. (Patient not taking: Reported on 03/20/2020)    . vitamin C (ASCORBIC ACID) 500 MG tablet Take 1,000 mg by mouth daily.    . vitamin E 180 MG (400 UNITS) capsule Take 400 Units by mouth daily.     No facility-administered medications prior to visit.    Allergies  Allergen Reactions  . Dextromethorphan Rash    ROS Review of Systems  Constitutional: Negative for chills, fever and unexpected weight change.  Respiratory: Negative for cough and shortness of breath.   Cardiovascular: Negative for chest pain.  Gastrointestinal: Negative for abdominal pain.  Genitourinary: Negative for dysuria.  Neurological: Negative for headaches.  Psychiatric/Behavioral: Negative for confusion.      Objective:    Physical Exam Vitals reviewed.  Constitutional:      Appearance: Normal appearance.  Cardiovascular:     Rate and Rhythm: Normal rate and regular rhythm.  Pulmonary:     Effort: Pulmonary effort is normal.     Breath sounds: Normal breath sounds.  Musculoskeletal:     Comments: Trace edema lower legs which is stable for her.  She has severe deformities of both hands related to her rheumatoid arthritis  Neurological:     General: No focal deficit present.     Mental Status: She is alert.     BP (!) 144/70   Pulse 78   Ht 5\' 4"   (1.626  m)   Wt 158 lb (71.7 kg)   SpO2 96%   BMI 27.12 kg/m  Wt Readings from Last 3 Encounters:  05/24/20 158 lb (71.7 kg)  03/20/20 157 lb 6.4 oz (71.4 kg)  02/20/20 152 lb (68.9 kg)     Health Maintenance Due  Topic Date Due  . COVID-19 Vaccine (1) Never done  . FOOT EXAM  Never done  . OPHTHALMOLOGY EXAM  Never done  . URINE MICROALBUMIN  Never done  . DEXA SCAN  Never done  . PNA vac Low Risk Adult (2 of 2 - PPSV23) 04/02/2009  . HEMOGLOBIN A1C  09/05/2019    There are no preventive care reminders to display for this patient.  Lab Results  Component Value Date   TSH 3.085 03/06/2019   Lab Results  Component Value Date   WBC 15.8 (H) 03/20/2020   HGB 13.2 03/20/2020   HCT 38.7 03/20/2020   MCV 95.1 03/20/2020   PLT 152 03/20/2020   Lab Results  Component Value Date   NA 143 03/20/2020   K 4.5 03/20/2020   CO2 27 03/20/2020   GLUCOSE 117 (H) 03/20/2020   BUN 19 03/20/2020   CREATININE 0.97 (H) 03/20/2020   BILITOT 1.6 (H) 10/26/2019   ALKPHOS 121 10/26/2019   AST 17 10/26/2019   ALT 16 10/26/2019   PROT 5.1 (L) 10/26/2019   ALBUMIN 2.1 (L) 10/26/2019   CALCIUM 8.8 03/20/2020   ANIONGAP 11 10/30/2019   GFR 52.36 (L) 06/09/2019   Lab Results  Component Value Date   CHOL 171 11/14/2018   Lab Results  Component Value Date   HDL 63.40 11/14/2018   Lab Results  Component Value Date   LDLCALC 74 11/14/2018   Lab Results  Component Value Date   TRIG 168.0 (H) 11/14/2018   Lab Results  Component Value Date   CHOLHDL 3 11/14/2018   Lab Results  Component Value Date   HGBA1C 5.9 (H) 03/07/2019      Assessment & Plan:   #1 hypertension.  She had severe elevation 2 days ago.  Other than some fatigue and the fact that she had a couple of episodes of sweats was asymptomatic.  Blood pressure is improved today but still 152/70 on repeat right arm seated  -Continue Cardizem and metoprolol -Add low-dose losartan 50 mg once daily -Stay  well-hydrated -Bring back in 3 to 4 weeks to reassess blood pressure and reassess basic metabolic panel then.  We will also check basic metabolic panel today -Watch sodium intake and keep less than 2400 mg daily  #2 atrial fibrillation.  Patient maintained on Eliquis and metoprolol and Cardizem as above.  Rate stable at this time.  Meds ordered this encounter  Medications  . losartan (COZAAR) 50 MG tablet    Sig: Take 1 tablet (50 mg total) by mouth daily.    Dispense:  90 tablet    Refill:  3    Follow-up: Return in about 1 month (around 06/21/2020).    Carolann Littler, MD

## 2020-05-24 NOTE — Patient Instructions (Signed)

## 2020-05-27 ENCOUNTER — Other Ambulatory Visit: Payer: Self-pay

## 2020-05-27 ENCOUNTER — Ambulatory Visit (HOSPITAL_BASED_OUTPATIENT_CLINIC_OR_DEPARTMENT_OTHER): Payer: Medicare Other | Admitting: Physical Therapy

## 2020-05-27 DIAGNOSIS — M6281 Muscle weakness (generalized): Secondary | ICD-10-CM | POA: Diagnosis not present

## 2020-05-27 DIAGNOSIS — R293 Abnormal posture: Secondary | ICD-10-CM | POA: Diagnosis not present

## 2020-05-27 DIAGNOSIS — R2689 Other abnormalities of gait and mobility: Secondary | ICD-10-CM | POA: Diagnosis not present

## 2020-05-27 DIAGNOSIS — M25551 Pain in right hip: Secondary | ICD-10-CM

## 2020-05-27 DIAGNOSIS — R262 Difficulty in walking, not elsewhere classified: Secondary | ICD-10-CM | POA: Diagnosis not present

## 2020-05-27 NOTE — Therapy (Signed)
[place] [place], [place], [address] Phone: [phone]   Fax:  [phone]  Physical Therapy Treatment  Patient Details  Name: Betty Jackson MRN: [id number] Date of Birth: 06-12-1934 Referring Provider (PT): Altamese San [place]ipe, MD   Encounter Date: 05/27/2020   PT End of Session - 05/27/20 1255    Visit Number 6    Number of Visits 25    Date for PT Re-Evaluation 07/08/20    Authorization Type [employer] Medicare    PT Start Time 1148    PT Stop Time 1230    PT Time Calculation (min) 42 min    Equipment Utilized During Treatment Other (comment)    Activity Tolerance Patient tolerated treatment well    Behavior During Therapy [facility] for tasks assessed/performed           Past Medical History:  Diagnosis Date  . Acute respiratory failure with hypoxia ([place]) 05/20/2017  . Anemia, unspecified 10/28/2012  . Anxiety state, unspecified 10/28/2012  . CAD (coronary artery disease)    a. Stent RCA in [facility];  b. Cath approx 2009 - nonobs per pt report.  . Cataract    immature on the left eye  . Chronic insomnia 02/07/2013  . Chronic lower back pain    scoliosis  . CKD (chronic kidney disease) stage 3, GFR 30-59 ml/min (HCC)   . CVA (cerebral infarction) 10/29/2012  . DDD (degenerative disc disease)   . Depression   . Diverticulosis   . GERD (gastroesophageal reflux disease) 09/01/2010  . Hemorrhoids   . Herniated nucleus pulposus, L5-S1, right 11/04/2015  . Hyperlipidemia    takes Lipitor daily  . Hypertension    takes Amlodipine,Losartan,Metoprolol,and HCTZ daily  . Incontinence of urine   . Insomnia    takes Restoril nightly  . Lumbar stenosis 04/25/2013  . Major depressive disorder, recurrent episode, moderate ([place]) 07/16/2013  . Osteoporosis   . PAF (paroxysmal atrial fibrillation) ([place]) 2011   a. lone epidode in 2011 according to notes.  . Rheumatoid arthritis ([place])   . Scoliosis   . Sepsis ([place]) 05/2019   . Temporal arteritis ([place]) 2011   a. followed @ [facility]; potential flareup 10/28/2012/notes 10/28/2012  . Vocal cord dysfunction    "they don't operate properly" (10/28/2012)    Past Surgical History:  Procedure Laterality Date  . ABDOMINAL HYSTERECTOMY  ~ 1984   vaginally  . BACK SURGERY  7-45yrs ago   X Stop  . BLADDER SUSPENSION  2001  . BREAST BIOPSY Right   . CATARACT EXTRACTION W/ INTRAOCULAR LENS IMPLANT Right ~ 08/2012  . CHEST TUBE INSERTION Left 03/08/2019   Procedure: Chest Tube Insertion;  Surgeon: Ivin Poot, MD;  Location: [place];  Service: Thoracic;  Laterality: Left;  . COLONOSCOPY  01/26/2012   Procedure: COLONOSCOPY;  Surgeon: Ladene Artist, MD,FACG;  Location: [facility] ENDOSCOPY;  Service: Endoscopy;  Laterality: N/A;  note the EGD is possible  . CORONARY ANGIOPLASTY WITH STENT PLACEMENT  2006   X 1 stent  . EPIDURAL BLOCK INJECTION    . ESOPHAGOGASTRODUODENOSCOPY  01/26/2012   Procedure: ESOPHAGOGASTRODUODENOSCOPY (EGD);  Surgeon: Ladene Artist, MD,FACG;  Location: [facility] ENDOSCOPY;  Service: Endoscopy;  Laterality: N/A;  . FINE NEEDLE ASPIRATION Right 09/26/2019   Procedure: FINE NEEDLE ASPIRATION;  Surgeon: Altamese , MD;  Location: [place];  Service: Orthopedics;  Laterality: Right;  . HEMIARTHROPLASTY HIP Right 2012  . IR EPIDUROGRAPHY  04/20/2019  . LAPAROSCOPIC CHOLECYSTECTOMY  2001  . LUMBAR FUSION  03/2013  . LUMBAR LAMINECTOMY/DECOMPRESSION MICRODISCECTOMY Right 11/04/2015   Procedure: Right Lumbar Five-Sacral One Microdiskectomy;  Surgeon: Kristeen Miss, MD;  Location: [place] NEURO ORS;  Service: Neurosurgery;  Laterality: Right;  Right L5-S1 Microdiskectomy  . moles removed that required stiches     one on leg and one on face  . ORIF FEMUR FRACTURE Right 09/26/2019   Procedure: OPEN REDUCTION INTERNAL FIXATION (ORIF) DISTAL FEMUR FRACTURE;  Surgeon: Altamese Pimaco Two, MD;  Location: [place];  Service: Orthopedics;  Laterality: Right;  . ORIF FEMUR FRACTURE Right 10/17/2019    Procedure: OPEN REDUCTION INTERNAL FIXATION (ORIF) DISTAL FEMUR FRACTURE;  Surgeon: Altamese Marlin, MD;  Location: [place];  Service: Orthopedics;  Laterality: Right;  . SUBXYPHOID PERICARDIAL WINDOW N/A 03/08/2019   Procedure: SUBXYPHOID PERICARDIAL WINDOW;  Surgeon: Ivin Poot, MD;  Location: [place];  Service: Thoracic;  Laterality: N/A;  . TEE WITHOUT CARDIOVERSION N/A 03/08/2019   Procedure: TRANSESOPHAGEAL ECHOCARDIOGRAM (TEE);  Surgeon: [place] Gum, Collier Salina, MD;  Location: [place];  Service: Thoracic;  Laterality: N/A;  . TEMPORAL ARTERY BIOPSY / LIGATION Bilateral 2011  . TONSILLECTOMY AND ADENOIDECTOMY     at age 31  . TUBAL LIGATION  ~ 1982  . X-STOP IMPLANTATION  ~ 2010   "lower back" (10/28/2012)    There were no vitals filed for this visit.   Subjective Assessment - 05/27/20 1154    Subjective Pt reports increased soreness during the weekend. Pt reports most of soreness has been in her knee    Patient is accompained by: --   caregiver   Pertinent History CAD, hypertension, history of temporal arteritis, atrial fibrillation, GERD, chronic back pain with multiple prior surgeries, osteoarthritis involving multiple joints    Limitations Standing    How long can you sit comfortably? N/a    How long can you stand comfortably? Not long    How long can you walk comfortably? 65' with RW (PLOF)    Patient Stated Goals Improve mobility and strength    Currently in Pain? Yes    Pain Location Knee    Pain Orientation Right    Pain Descriptors / Indicators Sore    Pain Type Acute pain    Pain Onset More than a month ago   Since fall                            [facility] Adult PT Treatment/Exercise - 05/27/20 0001      Knee/Hip Exercises: Aerobic   Nustep L2 x 5 min UE & LEs   for gentle AROM     Knee/Hip Exercises: Standing   Terminal Knee Extension Strengthening;Both;10 reps    Terminal Knee Extension Limitations Cues to keep trunk extension      Knee/Hip Exercises:  Supine   Quad Sets Strengthening;Right;10 reps    Heel Slides AROM;Right;10 reps;2 sets    Heel Slides Limitations Last set with TA contraction    Bridges Strengthening;Both;10 reps;2 sets    Other Supine Knee/Hip Exercises Feet 90/90 on pball with arms reaching opposite leg x10    Other Supine Knee/Hip Exercises pball press down onto bended x10 with 3 sec      Manual Therapy   Manual Therapy Soft tissue mobilization    Soft tissue mobilization STW quads and TPR along distal quad                    PT  Short Term Goals - 05/13/20 1406      PT SHORT TERM GOAL #1   Title Pt will be independent with HEP    Time 4    Period Weeks    Status New    Target Date 06/10/20      PT SHORT TERM GOAL #2   Title Pt will be able to perform 5x STS in <15 sec to demo improved functional strength    Baseline 19 sec    Time 4    Period Weeks    Status New    Target Date 06/10/20      PT SHORT TERM GOAL #3   Title Pt will be able to decrease TUG score by at least 10 sec to demo MCID    Baseline 40 sec    Time 4    Period Weeks    Status New    Target Date 06/10/20      PT SHORT TERM GOAL #4   Title Pt will be fully independent with transfers    Baseline Requires min A at times    Time 4    Period Weeks    Status New    Target Date 06/10/20             PT Long Term Goals - 05/13/20 1411      PT LONG TERM GOAL #1   Title Pt will be independent with maintaining mobility at home    Time 8    Period Weeks    Status New    Target Date 07/08/20      PT LONG TERM GOAL #2   Title Pt will have improved 5 x STS to </=13 sec to demo decreased fall risk    Baseline 19 sec    Time 8    Period Weeks    Status New    Target Date 07/08/20      PT LONG TERM GOAL #3   Title Pt will have improved TUG score to </=20 sec    Baseline 40 sec    Time 8    Period Weeks    Status New    Target Date 07/08/20      PT LONG TERM GOAL #4   Title Pt will have improved Berg Score to  at least 37/56 to demo Finzel for decreased fall risk    Baseline 29/56    Time 8    Period Weeks    Status New    Target Date 07/08/20                 Plan - 05/27/20 1250    Clinical Impression Statement Pt with increased R quad soreness and trigger points this session. Provided pt stretches, STW and TPR to address quads as needed. Due to pt's increased soreness focused particularly on core strengthening this session and gentle R knee AROM. Attempting to progress pt's tolerance to standing with improved posture. Some edema noted in R knee -- advised pt to ice and massage R quad as needed    Personal Factors and Comorbidities Age;Fitness;Comorbidity 1;Comorbidity 2;Comorbidity 3+;Time since onset of injury/illness/exacerbation    Comorbidities CAD, hypertension, history of temporal arteritis, atrial fibrillation, GERD, chronic back pain with multiple prior surgeries, osteoarthritis involving multiple joints    Examination-Activity Limitations Bed Mobility;Bend;Locomotion Level;Transfers;Dressing;Stand;Squat    Examination-Participation Restrictions Community Activity;Cleaning;Shop;Laundry    Stability/Clinical Decision Making Evolving/Moderate complexity    Rehab Potential Good    PT Frequency 3x / week  2x/wk on land; 1x/wk aquatics   PT Duration 8 weeks    PT Treatment/Interventions Aquatic Therapy;ADLs/Self Care Home Management;Cryotherapy;Electrical Stimulation;Biofeedback;Moist Heat;DME Instruction;Gait training;Stair training;Functional mobility training;Therapeutic activities;Therapeutic exercise;Balance training;Neuromuscular re-education;Manual techniques;Patient/family education;Orthotic Fit/Training;Dry needling;Taping;Vasopneumatic Device    PT Next Visit Plan Progress strengthening for hip extensors, hip abductors, quads and core. Work on balance, gait, and standing tolerance. Try prone position (on land) for hip flexor stretch/ neutral spine.  Manual therapy as indicated for  bilat knee.    PT Home Exercise Plan Access Code: [email or web address]    Consulted and Agree with Plan of Care Patient;Family member/caregiver    Family Member Consulted caregiver           Patient will benefit from skilled therapeutic intervention in order to improve the following deficits and impairments:  Abnormal gait,Decreased range of motion,Difficulty walking,Decreased endurance,Decreased activity tolerance,Pain,Decreased balance,Hypomobility,Improper body mechanics,Decreased mobility,Decreased strength,Postural dysfunction  Visit Diagnosis: Muscle weakness (generalized)  Pain in right hip  Abnormal posture  Difficulty in walking, not elsewhere classified  Other abnormalities of gait and mobility     Problem List Patient Active Problem List   Diagnosis Date Noted  . Cough 05/24/2020  . Dyspnea 05/24/2020  . Optic neuritis 05/24/2020  . Osteopenia 05/24/2020  . Rash 05/24/2020  . Vitamin D deficiency 05/24/2020  . Palliative care by specialist   . Goals of care, counseling/discussion   . DNR (do not resuscitate)   . Weakness   . Advanced directives, counseling/discussion   . Pressure injury of skin 10/18/2019  . Closed fracture of right distal femur ([place]) 10/17/2019  . Femur fracture, right ([place]) 09/25/2019  . Chronic diastolic CHF (congestive heart failure) ([place]) 09/25/2019  . HCAP (healthcare-associated pneumonia) 06/19/2019  . Encephalopathy 06/19/2019  . Lumbar degenerative disc disease   . Paroxysmal A-fib ([place])   . Hypomagnesemia   . C. difficile colitis   . Diarrhea 05/29/2019  . CHF (congestive heart failure) ([place]) 05/29/2019  . Recurrent falls 04/30/2019  . Pericardial effusion without cardiac tamponade 03/29/2019  . Hypoalbuminemia 03/27/2019  . Cardiac tamponade   . Pericarditis 03/06/2019  . Fever   . Septic shock ([place])   . Atypical chest pain   . Atrial fibrillation with rapid ventricular response ([place])   . Nausea 01/17/2019  . Acute diverticulitis  01/13/2019  . Chronic back pain 01/13/2019  . Tachycardia 01/13/2019  . Hypokalemia 01/13/2019  . Chronic low back pain 07/10/2018  . Influenza A 05/20/2017  . Severe sepsis ([place]) 05/20/2017  . Acute respiratory failure with hypoxia ([place]) 05/20/2017  . CKD (chronic kidney disease), stage III ([place]) 05/20/2017  . PAF (paroxysmal atrial fibrillation) ([place]) 05/20/2017  . HTN (hypertension) 05/20/2017  . Giant cell arteritis ([place]) 05/20/2017  . Coronary disease 05/20/2017  . Abnormal urinalysis 05/20/2017  . Osteoporosis 05/08/2016  . Herniated nucleus pulposus, L5-S1, right 11/04/2015  . Major depressive disorder, recurrent episode, moderate ([place]) 07/16/2013  . Abdominal pain, epigastric 06/22/2013  . Lumbar stenosis 04/25/2013  . Chronic insomnia 02/07/2013  . Elevated transaminase level 02/07/2013  . CVA (cerebral infarction) 10/29/2012  . Visual changes 10/28/2012  . Depression 10/28/2012  . Anxiety state 10/28/2012  . Anemia, unspecified 10/28/2012  . Nonspecific (abnormal) findings on radiological and other examination of gastrointestinal tract 01/25/2012  . Hyponatremia 01/24/2012  . Hematuria 01/24/2012  . Enteritis 12/24/2011  . Abdominal pain, other specified site 12/24/2011  . Leg pain, right 02/18/2011  . Rheumatoid arthritis ([place]) 02/04/2011  . Temporal arteritis ([place]) 09/01/2010  . Hypertension 09/01/2010  .  Hyperlipidemia 09/01/2010  . History of atrial fibrillation 09/01/2010  . CAD (coronary artery disease) 09/01/2010  . GERD (gastroesophageal reflux disease) 09/01/2010  . Insomnia 09/01/2010    Betty Jackson April Ma L Rontrell Moquin PT, DPT 05/27/2020, 12:59 PM  [facility] [place], [place], [address] Phone: [phone]   Fax:  [phone]  Name: ETHIE CURLESS MRN: [id number] Date of Birth: May 11, 1934

## 2020-05-28 ENCOUNTER — Telehealth: Payer: Self-pay | Admitting: Family Medicine

## 2020-05-28 NOTE — Telephone Encounter (Signed)
Tried calling patient to  schedule Medicare Annual Wellness Visit (AWV) either virtually or in office.  No answer   Last AWV no information  please schedule at anytime with [facility] Nurse Health Advisor 1 or 2   This should be a 45 minute visit.

## 2020-05-29 ENCOUNTER — Ambulatory Visit (HOSPITAL_BASED_OUTPATIENT_CLINIC_OR_DEPARTMENT_OTHER): Payer: Medicare Other | Attending: Orthopedic Surgery | Admitting: Physical Therapy

## 2020-05-29 ENCOUNTER — Other Ambulatory Visit: Payer: Self-pay

## 2020-05-29 DIAGNOSIS — R2689 Other abnormalities of gait and mobility: Secondary | ICD-10-CM | POA: Diagnosis not present

## 2020-05-29 DIAGNOSIS — M6281 Muscle weakness (generalized): Secondary | ICD-10-CM | POA: Insufficient documentation

## 2020-05-29 DIAGNOSIS — R262 Difficulty in walking, not elsewhere classified: Secondary | ICD-10-CM | POA: Insufficient documentation

## 2020-05-29 DIAGNOSIS — M25551 Pain in right hip: Secondary | ICD-10-CM | POA: Diagnosis not present

## 2020-05-29 DIAGNOSIS — I5032 Chronic diastolic (congestive) heart failure: Secondary | ICD-10-CM | POA: Diagnosis not present

## 2020-05-29 DIAGNOSIS — S72401A Unspecified fracture of lower end of right femur, initial encounter for closed fracture: Secondary | ICD-10-CM | POA: Diagnosis not present

## 2020-05-29 DIAGNOSIS — M5127 Other intervertebral disc displacement, lumbosacral region: Secondary | ICD-10-CM | POA: Diagnosis not present

## 2020-05-29 DIAGNOSIS — R293 Abnormal posture: Secondary | ICD-10-CM | POA: Diagnosis not present

## 2020-05-29 DIAGNOSIS — R531 Weakness: Secondary | ICD-10-CM | POA: Diagnosis not present

## 2020-05-29 NOTE — Therapy (Signed)
[place], [place], [address] Phone: [phone]   Fax:  [phone]  Physical Therapy Treatment  Patient Details  Name: Betty Jackson MRN: [id number] Date of Birth: Apr 21, 1934 Referring Provider (PT): Altamese [place], MD   Encounter Date: 05/29/2020   PT End of Session - 05/29/20 1250    Visit Number 7    Number of Visits 25    Date for PT Re-Evaluation 07/08/20    Authorization Type [employer] Medicare    Progress Note Due on Visit 10    PT Start Time 1145    PT Stop Time 1230    PT Time Calculation (min) 45 min    Equipment Utilized During Treatment Other (comment)    Activity Tolerance Patient tolerated treatment well    Behavior During Therapy [facility] for tasks assessed/performed           Past Medical History:  Diagnosis Date  . Acute respiratory failure with hypoxia ([place]) 05/20/2017  . Anemia, unspecified 10/28/2012  . Anxiety state, unspecified 10/28/2012  . CAD (coronary artery disease)    a. Stent RCA in [facility];  b. Cath approx 2009 - nonobs per pt report.  . Cataract    immature on the left eye  . Chronic insomnia 02/07/2013  . Chronic lower back pain    scoliosis  . CKD (chronic kidney disease) stage 3, GFR 30-59 ml/min (HCC)   . CVA (cerebral infarction) 10/29/2012  . DDD (degenerative disc disease)   . Depression   . Diverticulosis   . GERD (gastroesophageal reflux disease) 09/01/2010  . Hemorrhoids   . Herniated nucleus pulposus, L5-S1, right 11/04/2015  . Hyperlipidemia    takes Lipitor daily  . Hypertension    takes Amlodipine,Losartan,Metoprolol,and HCTZ daily  . Incontinence of urine   . Insomnia    takes Restoril nightly  . Lumbar stenosis 04/25/2013  . Major depressive disorder, recurrent episode, moderate ([place]) 07/16/2013  . Osteoporosis   . PAF (paroxysmal atrial fibrillation) ([place]) 2011   a. lone epidode in 2011 according to notes.  . Rheumatoid arthritis ([place])   .  Scoliosis   . Sepsis ([place]) 05/2019  . Temporal arteritis ([place]) 2011   a. followed @ [facility]; potential flareup 10/28/2012/notes 10/28/2012  . Vocal cord dysfunction    "they don't operate properly" (10/28/2012)    Past Surgical History:  Procedure Laterality Date  . ABDOMINAL HYSTERECTOMY  ~ 1984   vaginally  . BACK SURGERY  7-16yrs ago   X Stop  . BLADDER SUSPENSION  2001  . BREAST BIOPSY Right   . CATARACT EXTRACTION W/ INTRAOCULAR LENS IMPLANT Right ~ 08/2012  . CHEST TUBE INSERTION Left 03/08/2019   Procedure: Chest Tube Insertion;  Surgeon: Ivin Poot, MD;  Location: [place];  Service: Thoracic;  Laterality: Left;  . COLONOSCOPY  01/26/2012   Procedure: COLONOSCOPY;  Surgeon: Ladene Artist, MD,FACG;  Location: [facility] ENDOSCOPY;  Service: Endoscopy;  Laterality: N/A;  note the EGD is possible  . CORONARY ANGIOPLASTY WITH STENT PLACEMENT  2006   X 1 stent  . EPIDURAL BLOCK INJECTION    . ESOPHAGOGASTRODUODENOSCOPY  01/26/2012   Procedure: ESOPHAGOGASTRODUODENOSCOPY (EGD);  Surgeon: Ladene Artist, MD,FACG;  Location: [facility] ENDOSCOPY;  Service: Endoscopy;  Laterality: N/A;  . FINE NEEDLE ASPIRATION Right 09/26/2019   Procedure: FINE NEEDLE ASPIRATION;  Surgeon: Altamese , MD;  Location: [place];  Service: Orthopedics;  Laterality: Right;  . HEMIARTHROPLASTY HIP Right 2012  .  IR EPIDUROGRAPHY  04/20/2019  . LAPAROSCOPIC CHOLECYSTECTOMY  2001  . LUMBAR FUSION  03/2013  . LUMBAR LAMINECTOMY/DECOMPRESSION MICRODISCECTOMY Right 11/04/2015   Procedure: Right Lumbar Five-Sacral One Microdiskectomy;  Surgeon: Kristeen Miss, MD;  Location: [place] NEURO ORS;  Service: Neurosurgery;  Laterality: Right;  Right L5-S1 Microdiskectomy  . moles removed that required stiches     one on leg and one on face  . ORIF FEMUR FRACTURE Right 09/26/2019   Procedure: OPEN REDUCTION INTERNAL FIXATION (ORIF) DISTAL FEMUR FRACTURE;  Surgeon: Altamese [place], MD;  Location: [place];  Service: Orthopedics;  Laterality: Right;  .  ORIF FEMUR FRACTURE Right 10/17/2019   Procedure: OPEN REDUCTION INTERNAL FIXATION (ORIF) DISTAL FEMUR FRACTURE;  Surgeon: Altamese , MD;  Location: [place];  Service: Orthopedics;  Laterality: Right;  . SUBXYPHOID PERICARDIAL WINDOW N/A 03/08/2019   Procedure: SUBXYPHOID PERICARDIAL WINDOW;  Surgeon: Ivin Poot, MD;  Location: [place];  Service: Thoracic;  Laterality: N/A;  . TEE WITHOUT CARDIOVERSION N/A 03/08/2019   Procedure: TRANSESOPHAGEAL ECHOCARDIOGRAM (TEE);  Surgeon: Prescott Gum, Collier [place], MD;  Location: [place];  Service: Thoracic;  Laterality: N/A;  . TEMPORAL ARTERY BIOPSY / LIGATION Bilateral 2011  . TONSILLECTOMY AND ADENOIDECTOMY     at age 39  . TUBAL LIGATION  ~ 1982  . X-STOP IMPLANTATION  ~ 2010   "lower back" (10/28/2012)    There were no vitals filed for this visit.   Subjective Assessment - 05/29/20 1151    Subjective Pt states she's less sore.    Patient is accompained by: --   caregiver   Pertinent History CAD, hypertension, history of temporal arteritis, atrial fibrillation, GERD, chronic back pain with multiple prior surgeries, osteoarthritis involving multiple joints    Limitations Standing    How long can you sit comfortably? N/a    How long can you stand comfortably? Not long    How long can you walk comfortably? 68' with RW (PLOF)    Patient Stated Goals Improve mobility and strength    Currently in Pain? No/denies    Pain Onset More than a month ago   Since fall                            [place] Adult PT Treatment/Exercise - 05/29/20 0001      Knee/Hip Exercises: Stretches   Hip Flexor Stretch 30 seconds    Hip Flexor Stretch Limitations in prone      Knee/Hip Exercises: Aerobic   Nustep L5 x 5 min      Knee/Hip Exercises: Standing   Hip Abduction Stengthening;Both;2 sets;10 reps    Abduction Limitations sliding foot on wash rag    Other Standing Knee Exercises Standing donkey kick 2x10 bilat    Other Standing Knee Exercises  Standing glute set 2x10   Cues to keep from tensing back muscles     Knee/Hip Exercises: Seated   Other Seated Knee/Hip Exercises Pball ab set 2x10    Hamstring Curl Strengthening;Right;Left;10 reps    Hamstring Limitations Orange tband    Sit to [employer] without UE support;5 reps;2 sets      Knee/Hip Exercises: Prone   Hamstring Curl 10 reps                    PT Short Term Goals - 05/13/20 1406      PT SHORT TERM GOAL #1   Title Pt will be independent with HEP  Time 4    Period Weeks    Status New    Target Date 06/10/20      PT SHORT TERM GOAL #2   Title Pt will be able to perform 5x STS in <15 sec to demo improved functional strength    Baseline 19 sec    Time 4    Period Weeks    Status New    Target Date 06/10/20      PT SHORT TERM GOAL #3   Title Pt will be able to decrease TUG score by at least 10 sec to demo MCID    Baseline 40 sec    Time 4    Period Weeks    Status New    Target Date 06/10/20      PT SHORT TERM GOAL #4   Title Pt will be fully independent with transfers    Baseline Requires min A at times    Time 4    Period Weeks    Status New    Target Date 06/10/20             PT Long Term Goals - 05/13/20 1411      PT LONG TERM GOAL #1   Title Pt will be independent with maintaining mobility at home    Time 8    Period Weeks    Status New    Target Date 07/08/20      PT LONG TERM GOAL #2   Title Pt will have improved 5 x STS to </=13 sec to demo decreased fall risk    Baseline 19 sec    Time 8    Period Weeks    Status New    Target Date 07/08/20      PT LONG TERM GOAL #3   Title Pt will have improved TUG score to </=20 sec    Baseline 40 sec    Time 8    Period Weeks    Status New    Target Date 07/08/20      PT LONG TERM GOAL #4   Title Pt will have improved Berg Score to at least 37/56 to demo [place] for decreased fall risk    Baseline 29/56    Time 8    Period Weeks    Status New    Target Date 07/08/20                  Plan - 05/29/20 1220    Clinical Impression Statement Treatment focused on increasing standing exercises and working on standing posture. Pt tolerated exercises well. Cues to maintain hip motion without feeling in her low back. Pt enjoyed prone positioning.    Personal Factors and Comorbidities Age;Fitness;Comorbidity 1;Comorbidity 2;Comorbidity 3+;Time since onset of injury/illness/exacerbation    Comorbidities CAD, hypertension, history of temporal arteritis, atrial fibrillation, GERD, chronic back pain with multiple prior surgeries, osteoarthritis involving multiple joints    Examination-Activity Limitations Bed Mobility;Bend;Locomotion Level;Transfers;Dressing;Stand;Squat    Examination-Participation Restrictions Community Activity;Cleaning;Shop;Laundry    Stability/Clinical Decision Making Evolving/Moderate complexity    Rehab Potential Good    PT Frequency 3x / week   2x/wk on land; 1x/wk aquatics   PT Duration 8 weeks    PT Treatment/Interventions Aquatic Therapy;ADLs/Self Care Home Management;Cryotherapy;Electrical Stimulation;Biofeedback;Moist Heat;DME Instruction;Gait training;Stair training;Functional mobility training;Therapeutic activities;Therapeutic exercise;Balance training;Neuromuscular re-education;Manual techniques;Patient/family education;Orthotic Fit/Training;Dry needling;Taping;Vasopneumatic Device    PT Next Visit Plan Progress strengthening for hip extensors, hip abductors, quads and core. Work on balance, gait, and standing tolerance. Continue to  progress tolerance to standing. Manual therapy as indicated for bilat knee.    PT Home Exercise Plan Access Code: [email or web address]    Consulted and Agree with Plan of Care Patient;Family member/caregiver    Family Member Consulted caregiver           Patient will benefit from skilled therapeutic intervention in order to improve the following deficits and impairments:  Abnormal gait,Decreased range of motion,Difficulty  walking,Decreased endurance,Decreased activity tolerance,Pain,Decreased balance,Hypomobility,Improper body mechanics,Decreased mobility,Decreased strength,Postural dysfunction  Visit Diagnosis: Muscle weakness (generalized)  Pain in right hip  Abnormal posture  Difficulty in walking, not elsewhere classified  Other abnormalities of gait and mobility     Problem List Patient Active Problem List   Diagnosis Date Noted  . Cough 05/24/2020  . Dyspnea 05/24/2020  . Optic neuritis 05/24/2020  . Osteopenia 05/24/2020  . Rash 05/24/2020  . Vitamin D deficiency 05/24/2020  . Palliative care by specialist   . Goals of care, counseling/discussion   . DNR (do not resuscitate)   . Weakness   . Advanced directives, counseling/discussion   . Pressure injury of skin 10/18/2019  . Closed fracture of right distal femur ([place]) 10/17/2019  . Femur fracture, right ([place]) 09/25/2019  . Chronic diastolic CHF (congestive heart failure) ([place]) 09/25/2019  . HCAP (healthcare-associated pneumonia) 06/19/2019  . Encephalopathy 06/19/2019  . Lumbar degenerative disc disease   . Paroxysmal A-fib ([place])   . Hypomagnesemia   . C. difficile colitis   . Diarrhea 05/29/2019  . CHF (congestive heart failure) ([place]) 05/29/2019  . Recurrent falls 04/30/2019  . Pericardial effusion without cardiac tamponade 03/29/2019  . Hypoalbuminemia 03/27/2019  . Cardiac tamponade   . Pericarditis 03/06/2019  . Fever   . Septic shock ([place])   . Atypical chest pain   . Atrial fibrillation with rapid ventricular response ([place])   . Nausea 01/17/2019  . Acute diverticulitis 01/13/2019  . Chronic back pain 01/13/2019  . Tachycardia 01/13/2019  . Hypokalemia 01/13/2019  . Chronic low back pain 07/10/2018  . Influenza A 05/20/2017  . Severe sepsis ([place]) 05/20/2017  . Acute respiratory failure with hypoxia ([place]) 05/20/2017  . CKD (chronic kidney disease), stage III ([place]) 05/20/2017  . PAF (paroxysmal atrial fibrillation)  ([place]) 05/20/2017  . HTN (hypertension) 05/20/2017  . Giant cell arteritis ([place]) 05/20/2017  . Coronary disease 05/20/2017  . Abnormal urinalysis 05/20/2017  . Osteoporosis 05/08/2016  . Herniated nucleus pulposus, L5-S1, right 11/04/2015  . Major depressive disorder, recurrent episode, moderate ([place]) 07/16/2013  . Abdominal pain, epigastric 06/22/2013  . Lumbar stenosis 04/25/2013  . Chronic insomnia 02/07/2013  . Elevated transaminase level 02/07/2013  . CVA (cerebral infarction) 10/29/2012  . Visual changes 10/28/2012  . Depression 10/28/2012  . Anxiety state 10/28/2012  . Anemia, unspecified 10/28/2012  . Nonspecific (abnormal) findings on radiological and other examination of gastrointestinal tract 01/25/2012  . Hyponatremia 01/24/2012  . Hematuria 01/24/2012  . Enteritis 12/24/2011  . Abdominal pain, other specified site 12/24/2011  . Leg pain, right 02/18/2011  . Rheumatoid arthritis ([place]) 02/04/2011  . Temporal arteritis ([place]) 09/01/2010  . Hypertension 09/01/2010  . Hyperlipidemia 09/01/2010  . History of atrial fibrillation 09/01/2010  . CAD (coronary artery disease) 09/01/2010  . GERD (gastroesophageal reflux disease) 09/01/2010  . Insomnia 09/01/2010    Mellody Masri April Ma L Bernarda Erck PT, DPT 05/29/2020, 12:57 PM  [facility] [place], [place], [address] Phone: [phone]   Fax:  [phone]  Name: ANITTA TENNY MRN:  [id number] Date of Birth: 1934-10-03

## 2020-05-31 ENCOUNTER — Encounter (HOSPITAL_BASED_OUTPATIENT_CLINIC_OR_DEPARTMENT_OTHER): Payer: Self-pay | Admitting: Physical Therapy

## 2020-05-31 ENCOUNTER — Ambulatory Visit (HOSPITAL_BASED_OUTPATIENT_CLINIC_OR_DEPARTMENT_OTHER): Payer: Medicare Other | Admitting: Physical Therapy

## 2020-05-31 ENCOUNTER — Other Ambulatory Visit: Payer: Self-pay

## 2020-05-31 DIAGNOSIS — R262 Difficulty in walking, not elsewhere classified: Secondary | ICD-10-CM

## 2020-05-31 DIAGNOSIS — M6281 Muscle weakness (generalized): Secondary | ICD-10-CM | POA: Diagnosis not present

## 2020-05-31 DIAGNOSIS — M25551 Pain in right hip: Secondary | ICD-10-CM

## 2020-05-31 DIAGNOSIS — R2689 Other abnormalities of gait and mobility: Secondary | ICD-10-CM | POA: Diagnosis not present

## 2020-05-31 DIAGNOSIS — R293 Abnormal posture: Secondary | ICD-10-CM

## 2020-05-31 NOTE — Therapy (Signed)
[place], [place], [address] Phone: [phone]   Fax:  [phone]  Physical Therapy Treatment  Patient Details  Name: Betty Jackson MRN: [id number] Date of Birth: 09-28-1934 Referring Provider (PT): Altamese [place], MD   Encounter Date: 05/31/2020   PT End of Session - 05/31/20 1646    Visit Number 8    Number of Visits 25    Date for PT Re-Evaluation 07/08/20    Authorization Type [employer] Medicare    Progress Note Due on Visit 10    PT Start Time 1442    PT Stop Time 1525    PT Time Calculation (min) 43 min    Activity Tolerance Patient tolerated treatment well    Behavior During Therapy [facility] for tasks assessed/performed           Past Medical History:  Diagnosis Date  . Acute respiratory failure with hypoxia ([place]) 05/20/2017  . Anemia, unspecified 10/28/2012  . Anxiety state, unspecified 10/28/2012  . CAD (coronary artery disease)    a. Stent RCA in [facility];  b. Cath approx 2009 - nonobs per pt report.  . Cataract    immature on the left eye  . Chronic insomnia 02/07/2013  . Chronic lower back pain    scoliosis  . CKD (chronic kidney disease) stage 3, GFR 30-59 ml/min (HCC)   . CVA (cerebral infarction) 10/29/2012  . DDD (degenerative disc disease)   . Depression   . Diverticulosis   . GERD (gastroesophageal reflux disease) 09/01/2010  . Hemorrhoids   . Herniated nucleus pulposus, L5-S1, right 11/04/2015  . Hyperlipidemia    takes Lipitor daily  . Hypertension    takes Amlodipine,Losartan,Metoprolol,and HCTZ daily  . Incontinence of urine   . Insomnia    takes Restoril nightly  . Lumbar stenosis 04/25/2013  . Major depressive disorder, recurrent episode, moderate ([place]) 07/16/2013  . Osteoporosis   . PAF (paroxysmal atrial fibrillation) ([place]) 2011   a. lone epidode in 2011 according to notes.  . Rheumatoid arthritis ([place])   . Scoliosis   . Sepsis ([place]) 05/2019  . Temporal arteritis  ([place]) 2011   a. followed @ [facility]; potential flareup 10/28/2012/notes 10/28/2012  . Vocal cord dysfunction    "they don't operate properly" (10/28/2012)    Past Surgical History:  Procedure Laterality Date  . ABDOMINAL HYSTERECTOMY  ~ 1984   vaginally  . BACK SURGERY  7-34yrs ago   X Stop  . BLADDER SUSPENSION  2001  . BREAST BIOPSY Right   . CATARACT EXTRACTION W/ INTRAOCULAR LENS IMPLANT Right ~ 08/2012  . CHEST TUBE INSERTION Left 03/08/2019   Procedure: Chest Tube Insertion;  Surgeon: Ivin Poot, MD;  Location: [place];  Service: Thoracic;  Laterality: Left;  . COLONOSCOPY  01/26/2012   Procedure: COLONOSCOPY;  Surgeon: Ladene Artist, MD,FACG;  Location: [facility] ENDOSCOPY;  Service: Endoscopy;  Laterality: N/A;  note the EGD is possible  . CORONARY ANGIOPLASTY WITH STENT PLACEMENT  2006   X 1 stent  . EPIDURAL BLOCK INJECTION    . ESOPHAGOGASTRODUODENOSCOPY  01/26/2012   Procedure: ESOPHAGOGASTRODUODENOSCOPY (EGD);  Surgeon: Ladene Artist, MD,FACG;  Location: [facility] ENDOSCOPY;  Service: Endoscopy;  Laterality: N/A;  . FINE NEEDLE ASPIRATION Right 09/26/2019   Procedure: FINE NEEDLE ASPIRATION;  Surgeon: Altamese Polk, MD;  Location: [place];  Service: Orthopedics;  Laterality: Right;  . HEMIARTHROPLASTY HIP Right 2012  . IR EPIDUROGRAPHY  04/20/2019  . LAPAROSCOPIC CHOLECYSTECTOMY  2001  . LUMBAR FUSION  03/2013  . LUMBAR LAMINECTOMY/DECOMPRESSION MICRODISCECTOMY Right 11/04/2015   Procedure: Right Lumbar Five-Sacral One Microdiskectomy;  Surgeon: Kristeen Miss, MD;  Location: [place] NEURO ORS;  Service: Neurosurgery;  Laterality: Right;  Right L5-S1 Microdiskectomy  . moles removed that required stiches     one on leg and one on face  . ORIF FEMUR FRACTURE Right 09/26/2019   Procedure: OPEN REDUCTION INTERNAL FIXATION (ORIF) DISTAL FEMUR FRACTURE;  Surgeon: Altamese [place], MD;  Location: [place];  Service: Orthopedics;  Laterality: Right;  . ORIF FEMUR FRACTURE Right 10/17/2019   Procedure: OPEN  REDUCTION INTERNAL FIXATION (ORIF) DISTAL FEMUR FRACTURE;  Surgeon: Altamese , MD;  Location: [place];  Service: Orthopedics;  Laterality: Right;  . SUBXYPHOID PERICARDIAL WINDOW N/A 03/08/2019   Procedure: SUBXYPHOID PERICARDIAL WINDOW;  Surgeon: Ivin Poot, MD;  Location: [place];  Service: Thoracic;  Laterality: N/A;  . TEE WITHOUT CARDIOVERSION N/A 03/08/2019   Procedure: TRANSESOPHAGEAL ECHOCARDIOGRAM (TEE);  Surgeon: Prescott Gum, Collier Salina, MD;  Location: [place];  Service: Thoracic;  Laterality: N/A;  . TEMPORAL ARTERY BIOPSY / LIGATION Bilateral 2011  . TONSILLECTOMY AND ADENOIDECTOMY     at age 27  . TUBAL LIGATION  ~ 1982  . X-STOP IMPLANTATION  ~ 2010   "lower back" (10/28/2012)    There were no vitals filed for this visit.   Subjective Assessment - 05/31/20 1650    Subjective Florance reports that her Rt knee is feeling much better.  She has been working on standing more at home, but not walking.    Patient is accompained by: --   care giver   Currently in Pain? Yes    Pain Score 2     Pain Location Back    Pain Orientation Lower    Pain Descriptors / Indicators Dull;Aching    Aggravating Factors  standing, walking    Pain Relieving Factors sitting           Pt seen for aquatic therapy today.  Treatment took place in water 3.5-4 ft in depth. Temp of water was 90 deg.  Pt entered/exited the pool via stairs with bilat rail with close stand by assist.  Treatment:   With floatation belt and hands on noodle: Forward/backward gait working on upright posture, to increase Rt step length Side stepping SLS LT/ Rt x 2 reps of 10-15 sec (unsteady on LLE requiring UE with therapist to steady) Semi tandem stance x 20 sec each light CGA.   with UE support on pool edge: Heel raises x 5 x 3 sets .  Hip ext x 10 each  Seated: LAQ x 15 each leg with ankle DF during knee ext Scissor legs in long sitting Calf stretch with manual pressure from therapist x 2 reps LLE, 1 RLE  Sit to stand  to elevated bench seat in water x 10, with cues for upright posture once upright.  Standing Lt calf stretch (runner's) holding side of pool x 15 sec x 2 Row with pool noodle x 10   Pt requires buoyancy for support and to offload joints with strengthening exercises. Viscosity of the water is needed for resistance of strengthening; water current perturbations provides challenge to standing balance unsupported, requiring increased core activation.     PT Short Term Goals - 05/13/20 1406      PT SHORT TERM GOAL #1   Title Pt will be independent with HEP    Time 4    Period Weeks  Status New    Target Date 06/10/20      PT SHORT TERM GOAL #2   Title Pt will be able to perform 5x STS in <15 sec to demo improved functional strength    Baseline 19 sec    Time 4    Period Weeks    Status New    Target Date 06/10/20      PT SHORT TERM GOAL #3   Title Pt will be able to decrease TUG score by at least 10 sec to demo MCID    Baseline 40 sec    Time 4    Period Weeks    Status New    Target Date 06/10/20      PT SHORT TERM GOAL #4   Title Pt will be fully independent with transfers    Baseline Requires min A at times    Time 4    Period Weeks    Status New    Target Date 06/10/20             PT Long Term Goals - 05/13/20 1411      PT LONG TERM GOAL #1   Title Pt will be independent with maintaining mobility at home    Time 8    Period Weeks    Status New    Target Date 07/08/20      PT LONG TERM GOAL #2   Title Pt will have improved 5 x STS to </=13 sec to demo decreased fall risk    Baseline 19 sec    Time 8    Period Weeks    Status New    Target Date 07/08/20      PT LONG TERM GOAL #3   Title Pt will have improved TUG score to </=20 sec    Baseline 40 sec    Time 8    Period Weeks    Status New    Target Date 07/08/20      PT LONG TERM GOAL #4   Title Pt will have improved Berg Score to at least 37/56 to demo [place] for decreased fall risk    Baseline  29/56    Time 8    Period Weeks    Status New    Target Date 07/08/20                 Plan - 05/31/20 1652    Clinical Impression Statement Pt demonstrated longer bouts of upright posture during session, requiring less assistance from therapist for balance. Pt required UE support on pool noodle during session. Lt calf very tight; would benefit from land stretch and DF exercises. Pt making progress towards goals.    Personal Factors and Comorbidities Age;Fitness;Comorbidity 1;Comorbidity 2;Comorbidity 3+;Time since onset of injury/illness/exacerbation    Comorbidities CAD, hypertension, history of temporal arteritis, atrial fibrillation, GERD, chronic back pain with multiple prior surgeries, osteoarthritis involving multiple joints    Examination-Activity Limitations Bed Mobility;Bend;Locomotion Level;Transfers;Dressing;Stand;Squat    Examination-Participation Restrictions Community Activity;Cleaning;Shop;Laundry    Stability/Clinical Decision Making Evolving/Moderate complexity    Rehab Potential Good    PT Frequency 3x / week   2x/wk on land; 1x/wk aquatics   PT Duration 8 weeks    PT Treatment/Interventions Aquatic Therapy;ADLs/Self Care Home Management;Cryotherapy;Electrical Stimulation;Biofeedback;Moist Heat;DME Instruction;Gait training;Stair training;Functional mobility training;Therapeutic activities;Therapeutic exercise;Balance training;Neuromuscular re-education;Manual techniques;Patient/family education;Orthotic Fit/Training;Dry needling;Taping;Vasopneumatic Device    PT Next Visit Plan Progress strengthening for hip extensors, hip abductors, quads and core. Work on balance, gait, and standing tolerance.  Continue to progress tolerance to standing. Manual therapy as indicated for bilat knee.    PT Home Exercise Plan Access Code: [email or web address]    Consulted and Agree with Plan of Care Patient;Family member/caregiver    Family Member Consulted caregiver           Patient will  benefit from skilled therapeutic intervention in order to improve the following deficits and impairments:  Abnormal gait,Decreased range of motion,Difficulty walking,Decreased endurance,Decreased activity tolerance,Pain,Decreased balance,Hypomobility,Improper body mechanics,Decreased mobility,Decreased strength,Postural dysfunction  Visit Diagnosis: Muscle weakness (generalized)  Pain in right hip  Abnormal posture  Difficulty in walking, not elsewhere classified     Problem List Patient Active Problem List   Diagnosis Date Noted  . Cough 05/24/2020  . Dyspnea 05/24/2020  . Optic neuritis 05/24/2020  . Osteopenia 05/24/2020  . Rash 05/24/2020  . Vitamin D deficiency 05/24/2020  . Palliative care by specialist   . Goals of care, counseling/discussion   . DNR (do not resuscitate)   . Weakness   . Advanced directives, counseling/discussion   . Pressure injury of skin 10/18/2019  . Closed fracture of right distal femur ([place]) 10/17/2019  . Femur fracture, right ([place]) 09/25/2019  . Chronic diastolic CHF (congestive heart failure) ([place]) 09/25/2019  . HCAP (healthcare-associated pneumonia) 06/19/2019  . Encephalopathy 06/19/2019  . Lumbar degenerative disc disease   . Paroxysmal A-fib ([place])   . Hypomagnesemia   . C. difficile colitis   . Diarrhea 05/29/2019  . CHF (congestive heart failure) ([place]) 05/29/2019  . Recurrent falls 04/30/2019  . Pericardial effusion without cardiac tamponade 03/29/2019  . Hypoalbuminemia 03/27/2019  . Cardiac tamponade   . Pericarditis 03/06/2019  . Fever   . Septic shock ([place])   . Atypical chest pain   . Atrial fibrillation with rapid ventricular response ([place])   . Nausea 01/17/2019  . Acute diverticulitis 01/13/2019  . Chronic back pain 01/13/2019  . Tachycardia 01/13/2019  . Hypokalemia 01/13/2019  . Chronic low back pain 07/10/2018  . Influenza A 05/20/2017  . Severe sepsis ([place]) 05/20/2017  . Acute respiratory failure with hypoxia  ([place]) 05/20/2017  . CKD (chronic kidney disease), stage III ([place]) 05/20/2017  . PAF (paroxysmal atrial fibrillation) ([place]) 05/20/2017  . HTN (hypertension) 05/20/2017  . Giant cell arteritis ([place]) 05/20/2017  . Coronary disease 05/20/2017  . Abnormal urinalysis 05/20/2017  . Osteoporosis 05/08/2016  . Herniated nucleus pulposus, L5-S1, right 11/04/2015  . Major depressive disorder, recurrent episode, moderate ([place]) 07/16/2013  . Abdominal pain, epigastric 06/22/2013  . Lumbar stenosis 04/25/2013  . Chronic insomnia 02/07/2013  . Elevated transaminase level 02/07/2013  . CVA (cerebral infarction) 10/29/2012  . Visual changes 10/28/2012  . Depression 10/28/2012  . Anxiety state 10/28/2012  . Anemia, unspecified 10/28/2012  . Nonspecific (abnormal) findings on radiological and other examination of gastrointestinal tract 01/25/2012  . Hyponatremia 01/24/2012  . Hematuria 01/24/2012  . Enteritis 12/24/2011  . Abdominal pain, other specified site 12/24/2011  . Leg pain, right 02/18/2011  . Rheumatoid arthritis ([place]) 02/04/2011  . Temporal arteritis ([place]) 09/01/2010  . Hypertension 09/01/2010  . Hyperlipidemia 09/01/2010  . History of atrial fibrillation 09/01/2010  . CAD (coronary artery disease) 09/01/2010  . GERD (gastroesophageal reflux disease) 09/01/2010  . Insomnia 09/01/2010    Kerin Perna, PTA 05/31/20 5:00 PM  [place], [place], [address] Phone: [phone]   Fax:  [phone]  Name: LETECIA ARPS MRN: [id number] Date of Birth: 01/28/1935

## 2020-06-03 ENCOUNTER — Other Ambulatory Visit: Payer: Self-pay

## 2020-06-03 ENCOUNTER — Ambulatory Visit (HOSPITAL_BASED_OUTPATIENT_CLINIC_OR_DEPARTMENT_OTHER): Payer: Medicare Other | Admitting: Physical Therapy

## 2020-06-03 DIAGNOSIS — M25551 Pain in right hip: Secondary | ICD-10-CM

## 2020-06-03 DIAGNOSIS — M6281 Muscle weakness (generalized): Secondary | ICD-10-CM

## 2020-06-03 DIAGNOSIS — R293 Abnormal posture: Secondary | ICD-10-CM | POA: Diagnosis not present

## 2020-06-03 DIAGNOSIS — R262 Difficulty in walking, not elsewhere classified: Secondary | ICD-10-CM

## 2020-06-03 DIAGNOSIS — R2689 Other abnormalities of gait and mobility: Secondary | ICD-10-CM

## 2020-06-03 NOTE — Therapy (Signed)
[place], [place], [address] Phone: [phone]   Fax:  [phone]  Physical Therapy Treatment  Patient Details  Name: Betty Jackson MRN: [id number] Date of Birth: [birth year] Referring Provider (PT): Altamese Winfield, MD   Encounter Date: 06/03/2020   PT End of Session - 06/03/20 1235    Visit Number 9    Number of Visits 25    Date for PT Re-Evaluation 07/08/20    Authorization Type [employer] Medicare    Progress Note Due on Visit 10    PT Start Time 1145    PT Stop Time 1230    PT Time Calculation (min) 45 min    Activity Tolerance Patient tolerated treatment well    Behavior During Therapy [facility] for tasks assessed/performed           Past Medical History:  Diagnosis Date   Acute respiratory failure with hypoxia ([place]) 05/20/2017   Anemia, unspecified 10/28/2012   Anxiety state, unspecified 10/28/2012   CAD (coronary artery disease)    a. Stent RCA in [facility];  b. Cath approx 2009 - nonobs per pt report.   Cataract    immature on the left eye   Chronic insomnia 02/07/2013   Chronic lower back pain    scoliosis   CKD (chronic kidney disease) stage 3, GFR 30-59 ml/min (HCC)    CVA (cerebral infarction) 10/29/2012   DDD (degenerative disc disease)    Depression    Diverticulosis    GERD (gastroesophageal reflux disease) 09/01/2010   Hemorrhoids    Herniated nucleus pulposus, L5-S1, right 11/04/2015   Hyperlipidemia    takes Lipitor daily   Hypertension    takes Amlodipine,Losartan,Metoprolol,and HCTZ daily   Incontinence of urine    Insomnia    takes Restoril nightly   Lumbar stenosis 04/25/2013   Major depressive disorder, recurrent episode, moderate ([place]) 07/16/2013   Osteoporosis    PAF (paroxysmal atrial fibrillation) ([place]) 2011   a. lone epidode in 2011 according to notes.   Rheumatoid arthritis ([place])    Scoliosis    Sepsis ([place]) 05/2019   Temporal arteritis  ([place]) 2011   a. followed @ [facility]; potential flareup 10/28/2012/notes 10/28/2012   Vocal cord dysfunction    "they don't operate properly" (10/28/2012)    Past Surgical History:  Procedure Laterality Date   ABDOMINAL HYSTERECTOMY  ~ 1984   vaginally   BACK SURGERY  7-25yrs ago   X Stop   BLADDER SUSPENSION  2001   BREAST BIOPSY Right    CATARACT EXTRACTION W/ INTRAOCULAR LENS IMPLANT Right ~ 08/2012   CHEST TUBE INSERTION Left 03/08/2019   Procedure: Chest Tube Insertion;  Surgeon: Ivin Poot, MD;  Location: [place];  Service: Thoracic;  Laterality: Left;   COLONOSCOPY  01/26/2012   Procedure: COLONOSCOPY;  Surgeon: Ladene Artist, MD,FACG;  Location: [facility] ENDOSCOPY;  Service: Endoscopy;  Laterality: N/A;  note the EGD is possible   CORONARY ANGIOPLASTY WITH STENT PLACEMENT  2006   X 1 stent   EPIDURAL BLOCK INJECTION     ESOPHAGOGASTRODUODENOSCOPY  01/26/2012   Procedure: ESOPHAGOGASTRODUODENOSCOPY (EGD);  Surgeon: Ladene Artist, MD,FACG;  Location: [facility] ENDOSCOPY;  Service: Endoscopy;  Laterality: N/A;   FINE NEEDLE ASPIRATION Right 09/26/2019   Procedure: FINE NEEDLE ASPIRATION;  Surgeon: Altamese Willcox, MD;  Location: [place];  Service: Orthopedics;  Laterality: Right;   HEMIARTHROPLASTY HIP Right 2012   IR EPIDUROGRAPHY  04/20/2019   LAPAROSCOPIC CHOLECYSTECTOMY  2001   LUMBAR FUSION  03/2013   LUMBAR LAMINECTOMY/DECOMPRESSION MICRODISCECTOMY Right 11/04/2015   Procedure: Right Lumbar Five-Sacral One Microdiskectomy;  Surgeon: Kristeen Miss, MD;  Location: [place] NEURO ORS;  Service: Neurosurgery;  Laterality: Right;  Right L5-S1 Microdiskectomy   moles removed that required stiches     one on leg and one on face   ORIF FEMUR FRACTURE Right 09/26/2019   Procedure: OPEN REDUCTION INTERNAL FIXATION (ORIF) DISTAL FEMUR FRACTURE;  Surgeon: Altamese Dillon, MD;  Location: [place];  Service: Orthopedics;  Laterality: Right;   ORIF FEMUR FRACTURE Right 10/17/2019   Procedure: OPEN  REDUCTION INTERNAL FIXATION (ORIF) DISTAL FEMUR FRACTURE;  Surgeon: Altamese Braxton, MD;  Location: [place];  Service: Orthopedics;  Laterality: Right;   SUBXYPHOID PERICARDIAL WINDOW N/A 03/08/2019   Procedure: SUBXYPHOID PERICARDIAL WINDOW;  Surgeon: Ivin Poot, MD;  Location: [place];  Service: Thoracic;  Laterality: N/A;   TEE WITHOUT CARDIOVERSION N/A 03/08/2019   Procedure: TRANSESOPHAGEAL ECHOCARDIOGRAM (TEE);  Surgeon: Prescott Gum, Collier Salina, MD;  Location: [place];  Service: Thoracic;  Laterality: N/A;   TEMPORAL ARTERY BIOPSY / LIGATION Bilateral 2011   TONSILLECTOMY AND ADENOIDECTOMY     at age 81   [place]  ~ [place]  ~ 2010   "lower back" (10/28/2012)    There were no vitals filed for this visit.   Subjective Assessment - 06/03/20 1149    Subjective Pt reports R knee was a little sore after aquatic therapy but she iced it and it feels better. Pt reports that she's been stretching her L LE and focused on strengthening it this weekend.    Patient is accompained by: --   care giver   Currently in Pain? Yes    Pain Score 2     Pain Location Back    Pain Orientation Lower    Pain Descriptors / Indicators Dull;Aching    Pain Type Acute pain                             [facility] Adult PT Treatment/Exercise - 06/03/20 0001      Knee/Hip Exercises: Stretches   Passive Hamstring Stretch Left;2 reps;30 seconds    Gastroc Stretch Right;Left;Both;20 seconds;2 reps    Soleus Stretch Right;Left;1 rep;20 seconds      Knee/Hip Exercises: Aerobic   Nustep L5 x 5 min      Knee/Hip Exercises: Standing   Terminal Knee Extension Strengthening;Both;10 reps    Terminal Knee Extension Limitations Cues to keep trunk extension      Knee/Hip Exercises: Seated   Long Arc Quad Strengthening;Both;2 sets;10 reps    Clamshell with TheraBand Green   2x10   Other Seated Knee/Hip Exercises Pball ab set 2x10    Hamstring Curl Strengthening;Right;Left;10 reps     Hamstring Limitations green tband    Sit to Sand without UE support;5 reps;2 sets                    PT Short Term Goals - 05/13/20 1406      PT SHORT TERM GOAL #1   Title Pt will be independent with HEP    Time 4    Period Weeks    Status New    Target Date 06/10/20      PT SHORT TERM GOAL #2   Title Pt will be able to perform 5x STS in <15 sec to demo improved functional strength  Baseline 19 sec    Time 4    Period Weeks    Status New    Target Date 06/10/20      PT SHORT TERM GOAL #3   Title Pt will be able to decrease TUG score by at least 10 sec to demo MCID    Baseline 40 sec    Time 4    Period Weeks    Status New    Target Date 06/10/20      PT SHORT TERM GOAL #4   Title Pt will be fully independent with transfers    Baseline Requires min A at times    Time 4    Period Weeks    Status New    Target Date 06/10/20             PT Long Term Goals - 05/13/20 1411      PT LONG TERM GOAL #1   Title Pt will be independent with maintaining mobility at home    Time 8    Period Weeks    Status New    Target Date 07/08/20      PT LONG TERM GOAL #2   Title Pt will have improved 5 x STS to </=13 sec to demo decreased fall risk    Baseline 19 sec    Time 8    Period Weeks    Status New    Target Date 07/08/20      PT LONG TERM GOAL #3   Title Pt will have improved TUG score to </=20 sec    Baseline 40 sec    Time 8    Period Weeks    Status New    Target Date 07/08/20      PT LONG TERM GOAL #4   Title Pt will have improved Berg Score to at least 37/56 to demo [place] for decreased fall risk    Baseline 29/56    Time 8    Period Weeks    Status New    Target Date 07/08/20                 Plan - 06/03/20 1224    Clinical Impression Statement Treatment focused on providing pt stretches for L calf/LE. Continued work on improving standing tolerance and posture as well as overall LE strengthening. Initiated stepping exercises on 2"  step which did aggravate pt's back a little bit. Increased resistance to green band.    Personal Factors and Comorbidities Age;Fitness;Comorbidity 1;Comorbidity 2;Comorbidity 3+;Time since onset of injury/illness/exacerbation    Comorbidities CAD, hypertension, history of temporal arteritis, atrial fibrillation, GERD, chronic back pain with multiple prior surgeries, osteoarthritis involving multiple joints    Examination-Activity Limitations Bed Mobility;Bend;Locomotion Level;Transfers;Dressing;Stand;Squat    Examination-Participation Restrictions Community Activity;Cleaning;Shop;Laundry    Stability/Clinical Decision Making Evolving/Moderate complexity    Rehab Potential Good    PT Frequency 3x / week   2x/wk on land; 1x/wk aquatics   PT Duration 8 weeks    PT Treatment/Interventions Aquatic Therapy;ADLs/Self Care Home Management;Cryotherapy;Electrical Stimulation;Biofeedback;Moist Heat;DME Instruction;Gait training;Stair training;Functional mobility training;Therapeutic activities;Therapeutic exercise;Balance training;Neuromuscular re-education;Manual techniques;Patient/family education;Orthotic Fit/Training;Dry needling;Taping;Vasopneumatic Device    PT Next Visit Plan Progress strengthening for hip extensors, hip abductors, quads, hamstrings and core. Work on balance, gait, and standing tolerance. Stretch calves. Continue to progress tolerance to standing. Manual therapy as indicated for bilat knee.    PT Home Exercise Plan Access Code: [email or web address]    Consulted and Agree with Plan of Care Patient;Family member/caregiver  Family Member Consulted caregiver           Patient will benefit from skilled therapeutic intervention in order to improve the following deficits and impairments:  Abnormal gait,Decreased range of motion,Difficulty walking,Decreased endurance,Decreased activity tolerance,Pain,Decreased balance,Hypomobility,Improper body mechanics,Decreased mobility,Decreased strength,Postural  dysfunction  Visit Diagnosis: Muscle weakness (generalized)  Pain in right hip  Abnormal posture  Difficulty in walking, not elsewhere classified  Other abnormalities of gait and mobility     Problem List Patient Active Problem List   Diagnosis Date Noted   Cough 05/24/2020   Dyspnea 05/24/2020   Optic neuritis 05/24/2020   Osteopenia 05/24/2020   Rash 05/24/2020   Vitamin D deficiency 05/24/2020   Palliative care by specialist    Goals of care, counseling/discussion    DNR (do not resuscitate)    Weakness    Advanced directives, counseling/discussion    Pressure injury of skin 10/18/2019   Closed fracture of right distal femur ([place]) 10/17/2019   Femur fracture, right ([place]) 09/25/2019   Chronic diastolic CHF (congestive heart failure) ([place]) 09/25/2019   HCAP (healthcare-associated pneumonia) 06/19/2019   Encephalopathy 06/19/2019   Lumbar degenerative disc disease    Paroxysmal A-fib ([place])    Hypomagnesemia    C. difficile colitis    Diarrhea 05/29/2019   CHF (congestive heart failure) ([place]) 05/29/2019   Recurrent falls 04/30/2019   Pericardial effusion without cardiac tamponade 03/29/2019   Hypoalbuminemia 03/27/2019   Cardiac tamponade    Pericarditis 03/06/2019   Fever    Septic shock ([place])    Atypical chest pain    Atrial fibrillation with rapid ventricular response (HCC)    Nausea 01/17/2019   Acute diverticulitis 01/13/2019   Chronic back pain 01/13/2019   Tachycardia 01/13/2019   Hypokalemia 01/13/2019   Chronic low back pain 07/10/2018   Influenza A 05/20/2017   Severe sepsis ([place]) 05/20/2017   Acute respiratory failure with hypoxia ([place]) 05/20/2017   CKD (chronic kidney disease), stage III ([place]) 05/20/2017   PAF (paroxysmal atrial fibrillation) ([place]) 05/20/2017   HTN (hypertension) 05/20/2017   Giant cell arteritis ([place]) 05/20/2017   Coronary disease 05/20/2017   Abnormal urinalysis 05/20/2017    Osteoporosis 05/08/2016   Herniated nucleus pulposus, L5-S1, right 11/04/2015   Major depressive disorder, recurrent episode, moderate ([place]) 07/16/2013   Abdominal pain, epigastric 06/22/2013   Lumbar stenosis 04/25/2013   Chronic insomnia 02/07/2013   Elevated transaminase level 02/07/2013   CVA (cerebral infarction) 10/29/2012   Visual changes 10/28/2012   Depression 10/28/2012   Anxiety state 10/28/2012   Anemia, unspecified 10/28/2012   Nonspecific (abnormal) findings on radiological and other examination of gastrointestinal tract 01/25/2012   Hyponatremia 01/24/2012   Hematuria 01/24/2012   Enteritis 12/24/2011   Abdominal pain, other specified site 12/24/2011   Leg pain, right 02/18/2011   Rheumatoid arthritis ([place]) 02/04/2011   Temporal arteritis ([place]) 09/01/2010   Hypertension 09/01/2010   Hyperlipidemia 09/01/2010   History of atrial fibrillation 09/01/2010   CAD (coronary artery disease) 09/01/2010   GERD (gastroesophageal reflux disease) 09/01/2010   Insomnia 09/01/2010    Alaiah Lundy April Gordy Levan PT, DPT 06/03/2020, 12:37 PM  [place], [place], [address] Phone: [phone]   Fax:  [phone]  Name: Betty Jackson MRN: [id number] Date of Birth: 24-Feb-1935

## 2020-06-05 ENCOUNTER — Ambulatory Visit (HOSPITAL_BASED_OUTPATIENT_CLINIC_OR_DEPARTMENT_OTHER): Payer: Medicare Other | Admitting: Physical Therapy

## 2020-06-05 ENCOUNTER — Other Ambulatory Visit: Payer: Self-pay

## 2020-06-05 DIAGNOSIS — M6281 Muscle weakness (generalized): Secondary | ICD-10-CM

## 2020-06-05 DIAGNOSIS — R2689 Other abnormalities of gait and mobility: Secondary | ICD-10-CM | POA: Diagnosis not present

## 2020-06-05 DIAGNOSIS — M25551 Pain in right hip: Secondary | ICD-10-CM

## 2020-06-05 DIAGNOSIS — R293 Abnormal posture: Secondary | ICD-10-CM | POA: Diagnosis not present

## 2020-06-05 DIAGNOSIS — R262 Difficulty in walking, not elsewhere classified: Secondary | ICD-10-CM

## 2020-06-05 NOTE — Therapy (Addendum)
[place], [place], [address] Phone: [phone]   Fax:  [phone]  Physical Therapy Treatment  Patient Details  Name: Betty Jackson MRN: [id number] Date of Birth: [birth year] Referring Provider (PT): Altamese Carlton, MD  Progress Note  Reporting Period 05/13/20 to 06/05/20  See note below for Objective Data and Assessment of Progress/Goals.     Encounter Date: 06/05/2020   PT End of Session - 06/05/20 1350    Visit Number 10    Number of Visits 25    Date for PT Re-Evaluation 07/08/20    Authorization Type [employer] Medicare    Progress Note Due on Visit 10    PT Start Time 1300    PT Stop Time 1340    PT Time Calculation (min) 40 min    Activity Tolerance Patient tolerated treatment well    Behavior During Therapy WFL for tasks assessed/performed           Past Medical History:  Diagnosis Date  . Acute respiratory failure with hypoxia ([place]) 05/20/2017  . Anemia, unspecified 10/28/2012  . Anxiety state, unspecified 10/28/2012  . CAD (coronary artery disease)    a. Stent RCA in [facility];  b. Cath approx 2009 - nonobs per pt report.  . Cataract    immature on the left eye  . Chronic insomnia 02/07/2013  . Chronic lower back pain    scoliosis  . CKD (chronic kidney disease) stage 3, GFR 30-59 ml/min (HCC)   . CVA (cerebral infarction) 10/29/2012  . DDD (degenerative disc disease)   . Depression   . Diverticulosis   . GERD (gastroesophageal reflux disease) 09/01/2010  . Hemorrhoids   . Herniated nucleus pulposus, L5-S1, right 11/04/2015  . Hyperlipidemia    takes Lipitor daily  . Hypertension    takes Amlodipine,Losartan,Metoprolol,and HCTZ daily  . Incontinence of urine   . Insomnia    takes Restoril nightly  . Lumbar stenosis 04/25/2013  . Major depressive disorder, recurrent episode, moderate ([place]) 07/16/2013  . Osteoporosis   . PAF (paroxysmal atrial fibrillation) ([place]) 2011   a. lone  epidode in 2011 according to notes.  . Rheumatoid arthritis ([place])   . Scoliosis   . Sepsis ([place]) 05/2019  . Temporal arteritis ([place]) 2011   a. followed @ [facility]; potential flareup 10/28/2012/notes 10/28/2012  . Vocal cord dysfunction    "they don't operate properly" (10/28/2012)    Past Surgical History:  Procedure Laterality Date  . ABDOMINAL HYSTERECTOMY  ~ 1984   vaginally  . BACK SURGERY  7-69yrs ago   X Stop  . BLADDER SUSPENSION  2001  . BREAST BIOPSY Right   . CATARACT EXTRACTION W/ INTRAOCULAR LENS IMPLANT Right ~ 08/2012  . CHEST TUBE INSERTION Left 03/08/2019   Procedure: Chest Tube Insertion;  Surgeon: Ivin Poot, MD;  Location: [place];  Service: Thoracic;  Laterality: Left;  . COLONOSCOPY  01/26/2012   Procedure: COLONOSCOPY;  Surgeon: Ladene Artist, MD,FACG;  Location: [facility] ENDOSCOPY;  Service: Endoscopy;  Laterality: N/A;  note the EGD is possible  . CORONARY ANGIOPLASTY WITH STENT PLACEMENT  2006   X 1 stent  . EPIDURAL BLOCK INJECTION    . ESOPHAGOGASTRODUODENOSCOPY  01/26/2012   Procedure: ESOPHAGOGASTRODUODENOSCOPY (EGD);  Surgeon: Ladene Artist, MD,FACG;  Location: [facility] ENDOSCOPY;  Service: Endoscopy;  Laterality: N/A;  . FINE NEEDLE ASPIRATION Right 09/26/2019   Procedure: FINE NEEDLE ASPIRATION;  Surgeon: Altamese [place], MD;  Location: [place];  Service: Orthopedics;  Laterality: Right;  . HEMIARTHROPLASTY HIP Right 2012  . IR EPIDUROGRAPHY  04/20/2019  . LAPAROSCOPIC CHOLECYSTECTOMY  2001  . LUMBAR FUSION  03/2013  . LUMBAR LAMINECTOMY/DECOMPRESSION MICRODISCECTOMY Right 11/04/2015   Procedure: Right Lumbar Five-Sacral One Microdiskectomy;  Surgeon: Kristeen Miss, MD;  Location: [place] NEURO ORS;  Service: Neurosurgery;  Laterality: Right;  Right L5-S1 Microdiskectomy  . moles removed that required stiches     one on leg and one on face  . ORIF FEMUR FRACTURE Right 09/26/2019   Procedure: OPEN REDUCTION INTERNAL FIXATION (ORIF) DISTAL FEMUR FRACTURE;  Surgeon: Altamese [place], MD;  Location: [place];  Service: Orthopedics;  Laterality: Right;  . ORIF FEMUR FRACTURE Right 10/17/2019   Procedure: OPEN REDUCTION INTERNAL FIXATION (ORIF) DISTAL FEMUR FRACTURE;  Surgeon: Altamese [place], MD;  Location: [place];  Service: Orthopedics;  Laterality: Right;  . SUBXYPHOID PERICARDIAL WINDOW N/A 03/08/2019   Procedure: SUBXYPHOID PERICARDIAL WINDOW;  Surgeon: Ivin Poot, MD;  Location: [place];  Service: Thoracic;  Laterality: N/A;  . TEE WITHOUT CARDIOVERSION N/A 03/08/2019   Procedure: TRANSESOPHAGEAL ECHOCARDIOGRAM (TEE);  Surgeon: Prescott Gum, Collier Salina, MD;  Location: [place];  Service: Thoracic;  Laterality: N/A;  . TEMPORAL ARTERY BIOPSY / LIGATION Bilateral 2011  . TONSILLECTOMY AND ADENOIDECTOMY     at age 59  . TUBAL LIGATION  ~ 1982  . X-STOP IMPLANTATION  ~ 2010   "lower back" (10/28/2012)    There were no vitals filed for this visit.   Subjective Assessment - 06/05/20 1304    Subjective Pt states that her arthritis is flaring up today -- she feels it in her hands and bottom. Pt states she was sore after last session.    Patient is accompained by: --   care giver   Currently in Pain? Yes    Pain Score 2     Pain Location Back    Pain Orientation Lower    Pain Type Chronic pain                             [facility] Adult PT Treatment/Exercise - 06/05/20 0001      Knee/Hip Exercises: Stretches   Sports administrator Both;30 seconds    Gastroc Stretch Right;Left;20 seconds    Other Knee/Hip Stretches Piriformis stretch x30 sec bilat; figure 4 stretch x30 sec bilat, LTR 5x5 sec stretch      Knee/Hip Exercises: Aerobic   Nustep L2 x 6 min   LEs only today     Knee/Hip Exercises: Supine   Other Supine Knee/Hip Exercises pball press down onto bended x10 with 3 sec; opposite arm, opposite knee press x10      Knee/Hip Exercises: Prone   Hamstring Curl 10 reps;2 sets    Hip Extension Strengthening;Both;2 sets;5 reps    Hip Extension Limitations  Eccentrics; pt unable to perform concentric contraction                    PT Short Term Goals - 05/13/20 1406      PT SHORT TERM GOAL #1   Title Pt will be independent with HEP    Time 4    Period Weeks    Status New    Target Date 06/10/20      PT SHORT TERM GOAL #2   Title Pt will be able to perform 5x STS in <15 sec to demo improved functional strength    Baseline  19 sec    Time 4    Period Weeks    Status New    Target Date 06/10/20      PT SHORT TERM GOAL #3   Title Pt will be able to decrease TUG score by at least 10 sec to demo MCID    Baseline 40 sec    Time 4    Period Weeks    Status New    Target Date 06/10/20      PT SHORT TERM GOAL #4   Title Pt will be fully independent with transfers    Baseline Requires min A at times    Time 4    Period Weeks    Status New    Target Date 06/10/20             PT Long Term Goals - 05/13/20 1411      PT LONG TERM GOAL #1   Title Pt will be independent with maintaining mobility at home    Time 8    Period Weeks    Status New    Target Date 07/08/20      PT LONG TERM GOAL #2   Title Pt will have improved 5 x STS to </=13 sec to demo decreased fall risk    Baseline 19 sec    Time 8    Period Weeks    Status New    Target Date 07/08/20      PT LONG TERM GOAL #3   Title Pt will have improved TUG score to </=20 sec    Baseline 40 sec    Time 8    Period Weeks    Status New    Target Date 07/08/20      PT LONG TERM GOAL #4   Title Pt will have improved Berg Score to at least 37/56 to demo Stella for decreased fall risk    Baseline 29/56    Time 8    Period Weeks    Status New    Target Date 07/08/20                 Plan - 06/05/20 1329    Clinical Impression Statement Due to arthritis flare up, performed core/LE strengthening in prone and supine this session and less in standing. Pt unable to perform hip extension in prone without assist. Will continue to strengthen pt as able.     Personal Factors and Comorbidities Age;Fitness;Comorbidity 1;Comorbidity 2;Comorbidity 3+;Time since onset of injury/illness/exacerbation    Comorbidities CAD, hypertension, history of temporal arteritis, atrial fibrillation, GERD, chronic back pain with multiple prior surgeries, osteoarthritis involving multiple joints    Examination-Activity Limitations Bed Mobility;Bend;Locomotion Level;Transfers;Dressing;Stand;Squat    Examination-Participation Restrictions Community Activity;Cleaning;Shop;Laundry    Stability/Clinical Decision Making Evolving/Moderate complexity    Rehab Potential Good    PT Frequency 3x / week   2x/wk on land; 1x/wk aquatics   PT Duration 8 weeks    PT Treatment/Interventions Aquatic Therapy;ADLs/Self Care Home Management;Cryotherapy;Electrical Stimulation;Biofeedback;Moist Heat;DME Instruction;Gait training;Stair training;Functional mobility training;Therapeutic activities;Therapeutic exercise;Balance training;Neuromuscular re-education;Manual techniques;Patient/family education;Orthotic Fit/Training;Dry needling;Taping;Vasopneumatic Device    PT Next Visit Plan Progress strengthening for hip extensors, hip abductors, quads, hamstrings and core. Work on balance, gait, and standing tolerance. Stretch calves. Continue to progress tolerance to standing. Manual therapy as indicated for bilat knee.    PT Home Exercise Plan Access Code: [email or web address]    Consulted and Agree with Plan of Care Patient;Family member/caregiver    Family Member Consulted caregiver  Patient will benefit from skilled therapeutic intervention in order to improve the following deficits and impairments:  Abnormal gait,Decreased range of motion,Difficulty walking,Decreased endurance,Decreased activity tolerance,Pain,Decreased balance,Hypomobility,Improper body mechanics,Decreased mobility,Decreased strength,Postural dysfunction  Visit Diagnosis: Muscle weakness (generalized)  Abnormal posture  Pain  in right hip  Difficulty in walking, not elsewhere classified  Other abnormalities of gait and mobility     Problem List Patient Active Problem List   Diagnosis Date Noted  . Cough 05/24/2020  . Dyspnea 05/24/2020  . Optic neuritis 05/24/2020  . Osteopenia 05/24/2020  . Rash 05/24/2020  . Vitamin D deficiency 05/24/2020  . Palliative care by specialist   . Goals of care, counseling/discussion   . DNR (do not resuscitate)   . Weakness   . Advanced directives, counseling/discussion   . Pressure injury of skin 10/18/2019  . Closed fracture of right distal femur ([place]) 10/17/2019  . Femur fracture, right ([place]) 09/25/2019  . Chronic diastolic CHF (congestive heart failure) ([place]) 09/25/2019  . HCAP (healthcare-associated pneumonia) 06/19/2019  . Encephalopathy 06/19/2019  . Lumbar degenerative disc disease   . Paroxysmal A-fib ([place])   . Hypomagnesemia   . C. difficile colitis   . Diarrhea 05/29/2019  . CHF (congestive heart failure) ([place]) 05/29/2019  . Recurrent falls 04/30/2019  . Pericardial effusion without cardiac tamponade 03/29/2019  . Hypoalbuminemia 03/27/2019  . Cardiac tamponade   . Pericarditis 03/06/2019  . Fever   . Septic shock ([place])   . Atypical chest pain   . Atrial fibrillation with rapid ventricular response ([place])   . Nausea 01/17/2019  . Acute diverticulitis 01/13/2019  . Chronic back pain 01/13/2019  . Tachycardia 01/13/2019  . Hypokalemia 01/13/2019  . Chronic low back pain 07/10/2018  . Influenza A 05/20/2017  . Severe sepsis ([place]) 05/20/2017  . Acute respiratory failure with hypoxia ([place]) 05/20/2017  . CKD (chronic kidney disease), stage III ([place]) 05/20/2017  . PAF (paroxysmal atrial fibrillation) ([place]) 05/20/2017  . HTN (hypertension) 05/20/2017  . Giant cell arteritis ([place]) 05/20/2017  . Coronary disease 05/20/2017  . Abnormal urinalysis 05/20/2017  . Osteoporosis 05/08/2016  . Herniated nucleus pulposus, L5-S1, right 11/04/2015  . Major  depressive disorder, recurrent episode, moderate ([place]) 07/16/2013  . Abdominal pain, epigastric 06/22/2013  . Lumbar stenosis 04/25/2013  . Chronic insomnia 02/07/2013  . Elevated transaminase level 02/07/2013  . CVA (cerebral infarction) 10/29/2012  . Visual changes 10/28/2012  . Depression 10/28/2012  . Anxiety state 10/28/2012  . Anemia, unspecified 10/28/2012  . Nonspecific (abnormal) findings on radiological and other examination of gastrointestinal tract 01/25/2012  . Hyponatremia 01/24/2012  . Hematuria 01/24/2012  . Enteritis 12/24/2011  . Abdominal pain, other specified site 12/24/2011  . Leg pain, right 02/18/2011  . Rheumatoid arthritis ([place]) 02/04/2011  . Temporal arteritis ([place]) 09/01/2010  . Hypertension 09/01/2010  . Hyperlipidemia 09/01/2010  . History of atrial fibrillation 09/01/2010  . CAD (coronary artery disease) 09/01/2010  . GERD (gastroesophageal reflux disease) 09/01/2010  . Insomnia 09/01/2010    Amritpal Shropshire April Ma L Kamauri Denardo PT, DPT 06/05/2020, 1:50 PM  [facility] [address] [place], [place], [address] Phone: [phone]   Fax:  [phone]  Name: DEMMI SINDT MRN: [id number] Date of Birth: [birth year]

## 2020-06-07 ENCOUNTER — Other Ambulatory Visit: Payer: Self-pay | Admitting: Cardiovascular Disease

## 2020-06-07 ENCOUNTER — Other Ambulatory Visit: Payer: Self-pay

## 2020-06-07 ENCOUNTER — Encounter: Payer: Self-pay | Admitting: Family Medicine

## 2020-06-07 ENCOUNTER — Ambulatory Visit (HOSPITAL_BASED_OUTPATIENT_CLINIC_OR_DEPARTMENT_OTHER): Payer: Medicare Other | Admitting: Physical Therapy

## 2020-06-07 ENCOUNTER — Encounter (HOSPITAL_BASED_OUTPATIENT_CLINIC_OR_DEPARTMENT_OTHER): Payer: Self-pay | Admitting: Physical Therapy

## 2020-06-07 ENCOUNTER — Other Ambulatory Visit: Payer: Self-pay | Admitting: Family Medicine

## 2020-06-07 DIAGNOSIS — M6281 Muscle weakness (generalized): Secondary | ICD-10-CM

## 2020-06-07 DIAGNOSIS — R293 Abnormal posture: Secondary | ICD-10-CM

## 2020-06-07 DIAGNOSIS — M25551 Pain in right hip: Secondary | ICD-10-CM | POA: Diagnosis not present

## 2020-06-07 DIAGNOSIS — R2689 Other abnormalities of gait and mobility: Secondary | ICD-10-CM | POA: Diagnosis not present

## 2020-06-07 DIAGNOSIS — R262 Difficulty in walking, not elsewhere classified: Secondary | ICD-10-CM | POA: Diagnosis not present

## 2020-06-07 MED ORDER — POTASSIUM CHLORIDE CRYS ER 10 MEQ PO TBCR
10.0000 meq | EXTENDED_RELEASE_TABLET | Freq: Every day | ORAL | 2 refills | Status: DC
Start: 2020-06-07 — End: 2021-09-11

## 2020-06-07 MED ORDER — HYDROCODONE-ACETAMINOPHEN 10-325 MG PO TABS: ORAL_TABLET | ORAL | 0 refills | Status: DC

## 2020-06-07 NOTE — Therapy (Signed)
[place], [place], [address] Phone: [phone]   Fax:  [phone]  Physical Therapy Treatment  Patient Details  Name: Betty Jackson MRN: [id number] Date of Birth: Sep 22, 1980 Referring Provider (PT): Altamese Waterbury, MD   Encounter Date: 06/07/2020   PT End of Session - 06/07/20 1716    Visit Number 11    Number of Visits 25    Date for PT Re-Evaluation 07/08/20    Authorization Type [employer] Medicare    Progress Note Due on Visit 20    PT Start Time 1445    PT Stop Time 1525    PT Time Calculation (min) 40 min    Activity Tolerance Patient tolerated treatment well    Behavior During Therapy [facility] for tasks assessed/performed           Past Medical History:  Diagnosis Date  . Acute respiratory failure with hypoxia ([place]) 05/20/2017  . Anemia, unspecified 10/28/2012  . Anxiety state, unspecified 10/28/2012  . CAD (coronary artery disease)    a. Stent RCA in [facility];  b. Cath approx 2009 - nonobs per pt report.  . Cataract    immature on the left eye  . Chronic insomnia 02/07/2013  . Chronic lower back pain    scoliosis  . CKD (chronic kidney disease) stage 3, GFR 30-59 ml/min (HCC)   . CVA (cerebral infarction) 10/29/2012  . DDD (degenerative disc disease)   . Depression   . Diverticulosis   . GERD (gastroesophageal reflux disease) 09/01/2010  . Hemorrhoids   . Herniated nucleus pulposus, L5-S1, right 11/04/2015  . Hyperlipidemia    takes Lipitor daily  . Hypertension    takes Amlodipine,Losartan,Metoprolol,and HCTZ daily  . Incontinence of urine   . Insomnia    takes Restoril nightly  . Lumbar stenosis 04/25/2013  . Major depressive disorder, recurrent episode, moderate ([place]) 07/16/2013  . Osteoporosis   . PAF (paroxysmal atrial fibrillation) ([place]) 2011   a. lone epidode in 2011 according to notes.  . Rheumatoid arthritis ([place])   . Scoliosis   . Sepsis ([place]) 05/2019  . Temporal arteritis  ([place]) 2011   a. followed @ [facility]; potential flareup 10/28/2012/notes 10/28/2012  . Vocal cord dysfunction    "they don't operate properly" (10/28/2012)    Past Surgical History:  Procedure Laterality Date  . ABDOMINAL HYSTERECTOMY  ~ 1984   vaginally  . BACK SURGERY  7-56yrs ago   X Stop  . BLADDER SUSPENSION  2001  . BREAST BIOPSY Right   . CATARACT EXTRACTION W/ INTRAOCULAR LENS IMPLANT Right ~ 08/2012  . CHEST TUBE INSERTION Left 03/08/2019   Procedure: Chest Tube Insertion;  Surgeon: Ivin Poot, MD;  Location: [place];  Service: Thoracic;  Laterality: Left;  . COLONOSCOPY  01/26/2012   Procedure: COLONOSCOPY;  Surgeon: Ladene Artist, MD,FACG;  Location: [facility] ENDOSCOPY;  Service: Endoscopy;  Laterality: N/A;  note the EGD is possible  . CORONARY ANGIOPLASTY WITH STENT PLACEMENT  2006   X 1 stent  . EPIDURAL BLOCK INJECTION    . ESOPHAGOGASTRODUODENOSCOPY  01/26/2012   Procedure: ESOPHAGOGASTRODUODENOSCOPY (EGD);  Surgeon: Ladene Artist, MD,FACG;  Location: [facility] ENDOSCOPY;  Service: Endoscopy;  Laterality: N/A;  . FINE NEEDLE ASPIRATION Right 09/26/2019   Procedure: FINE NEEDLE ASPIRATION;  Surgeon: Altamese Weatherly, MD;  Location: [place];  Service: Orthopedics;  Laterality: Right;  . HEMIARTHROPLASTY HIP Right 2012  . IR EPIDUROGRAPHY  04/20/2019  . LAPAROSCOPIC CHOLECYSTECTOMY  2001  . LUMBAR FUSION  03/2013  . LUMBAR LAMINECTOMY/DECOMPRESSION MICRODISCECTOMY Right 11/04/2015   Procedure: Right Lumbar Five-Sacral One Microdiskectomy;  Surgeon: Kristeen Miss, MD;  Location: [place] NEURO ORS;  Service: Neurosurgery;  Laterality: Right;  Right L5-S1 Microdiskectomy  . moles removed that required stiches     one on leg and one on face  . ORIF FEMUR FRACTURE Right 09/26/2019   Procedure: OPEN REDUCTION INTERNAL FIXATION (ORIF) DISTAL FEMUR FRACTURE;  Surgeon: Altamese [place], MD;  Location: [place];  Service: Orthopedics;  Laterality: Right;  . ORIF FEMUR FRACTURE Right 10/17/2019   Procedure: OPEN  REDUCTION INTERNAL FIXATION (ORIF) DISTAL FEMUR FRACTURE;  Surgeon: Altamese [place], MD;  Location: [place];  Service: Orthopedics;  Laterality: Right;  . SUBXYPHOID PERICARDIAL WINDOW N/A 03/08/2019   Procedure: SUBXYPHOID PERICARDIAL WINDOW;  Surgeon: Ivin Poot, MD;  Location: [place];  Service: Thoracic;  Laterality: N/A;  . TEE WITHOUT CARDIOVERSION N/A 03/08/2019   Procedure: TRANSESOPHAGEAL ECHOCARDIOGRAM (TEE);  Surgeon: Prescott Gum, Collier Salina, MD;  Location: [place];  Service: Thoracic;  Laterality: N/A;  . TEMPORAL ARTERY BIOPSY / LIGATION Bilateral 2011  . TONSILLECTOMY AND ADENOIDECTOMY     at age 86  . TUBAL LIGATION  ~ 1982  . X-STOP IMPLANTATION  ~ 2010   "lower back" (10/28/2012)    There were no vitals filed for this visit.   Subjective Assessment - 06/07/20 1717    Subjective "I can get around better now (transfers) and l'm less likely to fall. I'm getting stronger."    Patient is accompained by: --   care giver   Pertinent History CAD, hypertension, history of temporal arteritis, atrial fibrillation, GERD, chronic back pain with multiple prior surgeries, osteoarthritis involving multiple joints    Currently in Pain? Yes    Pain Score 3     Pain Location Buttocks    Pain Orientation Right    Pain Descriptors / Indicators Aching           Pt seen for aquatic therapy today.  Treatment took place in water 3.25-4 ft in depth at the [place] pool. Temp of water was 90 deg.  Pt entered/exited the pool via stairs with CGA with bilat UE on rail.  Treatment:   Sit to/from stand at elevated bench seat in water x 10 with UE support on edge With UE support on edge: Knee bends x 10 each, glute sets x 5 sec x 10. Side stepping Rt/ Lt x 12 ft, 2 sets Seated LAQ x 10 each, DF/PF x 10 Runners stretch x 20 sec x 2 reps each Seated row holding noodle x 10 with scap squeeze Seated TA set pushing noodle under water (in seated position) x 5 sec x 10 Gait with water walker  forward/backward Supine leg press off of wall x 10 Supine float for decompression  Pt requires buoyancy for support and to offload joints with strengthening exercises. Viscosity of the water is needed for resistance of strengthening; water current perturbations provides challenge to standing balance unsupported, requiring increased core activation.     PT Short Term Goals - 06/07/20 1722      PT SHORT TERM GOAL #1   Title Pt will be independent with HEP    Time 4    Period Weeks    Status New    Target Date 06/10/20      PT SHORT TERM GOAL #2   Title Pt will be able to perform 5x STS in <15 sec to  demo improved functional strength    Baseline 19 sec    Time 4    Period Weeks    Status New    Target Date 06/10/20      PT SHORT TERM GOAL #3   Title Pt will be able to decrease TUG score by at least 10 sec to demo MCID    Baseline 40 sec    Time 4    Period Weeks    Status New    Target Date 06/10/20      PT SHORT TERM GOAL #4   Title Pt will be fully independent with transfers    Baseline Requires min A at times    Time 4    Period Weeks    Status New    Target Date 06/10/20             PT Long Term Goals - 05/13/20 1411      PT LONG TERM GOAL #1   Title Pt will be independent with maintaining mobility at home    Time 8    Period Weeks    Status New    Target Date 07/08/20      PT LONG TERM GOAL #2   Title Pt will have improved 5 x STS to </=13 sec to demo decreased fall risk    Baseline 19 sec    Time 8    Period Weeks    Status New    Target Date 07/08/20      PT LONG TERM GOAL #3   Title Pt will have improved TUG score to </=20 sec    Baseline 40 sec    Time 8    Period Weeks    Status New    Target Date 07/08/20      PT LONG TERM GOAL #4   Title Pt will have improved Berg Score to at least 37/56 to demo [place] for decreased fall risk    Baseline 29/56    Time 8    Period Weeks    Status New    Target Date 07/08/20                  Plan - 06/07/20 1718    Clinical Impression Statement Pt demonstrating more upright posture during aquatic gait trials, with greater ease.  She required a few short seated rest breaks due to increased LBP with upright posture, but is able to walk longer distances with aquatic walker than in previous sessions.   Lt calf remains tight; affects her ability to complete SLS on LLE and falls backward after 4 sec of attempt.  Pt participates well throughout and remains motivated to progress towards LTGs.    Personal Factors and Comorbidities Age;Fitness;Comorbidity 1;Comorbidity 2;Comorbidity 3+;Time since onset of injury/illness/exacerbation    Comorbidities CAD, hypertension, history of temporal arteritis, atrial fibrillation, GERD, chronic back pain with multiple prior surgeries, osteoarthritis involving multiple joints    Examination-Activity Limitations Bed Mobility;Bend;Locomotion Level;Transfers;Dressing;Stand;Squat    Examination-Participation Restrictions Community Activity;Cleaning;Shop;Laundry    Stability/Clinical Decision Making Evolving/Moderate complexity    Rehab Potential Good    PT Frequency 3x / week   2x/wk on land; 1x/wk aquatics   PT Duration 8 weeks    PT Treatment/Interventions Aquatic Therapy;ADLs/Self Care Home Management;Cryotherapy;Electrical Stimulation;Biofeedback;Moist Heat;DME Instruction;Gait training;Stair training;Functional mobility training;Therapeutic activities;Therapeutic exercise;Balance training;Neuromuscular re-education;Manual techniques;Patient/family education;Orthotic Fit/Training;Dry needling;Taping;Vasopneumatic Device    PT Next Visit Plan Land:  assess STGs (TUG/ STS). Aquatics:  continue stretches for LLE and balance on LLE.  Utilize aquatic floatation walker for gait trials.  short trials of steps for LE strengthening.    PT Home Exercise Plan Access Code: [email or web address]    Consulted and Agree with Plan of Care Patient;Family member/caregiver    Family Member  Consulted caregiver           Patient will benefit from skilled therapeutic intervention in order to improve the following deficits and impairments:  Abnormal gait,Decreased range of motion,Difficulty walking,Decreased endurance,Decreased activity tolerance,Pain,Decreased balance,Hypomobility,Improper body mechanics,Decreased mobility,Decreased strength,Postural dysfunction  Visit Diagnosis: Muscle weakness (generalized)  Abnormal posture  Pain in right hip  Difficulty in walking, not elsewhere classified     Problem List Patient Active Problem List   Diagnosis Date Noted  . Cough 05/24/2020  . Dyspnea 05/24/2020  . Optic neuritis 05/24/2020  . Osteopenia 05/24/2020  . Rash 05/24/2020  . Vitamin D deficiency 05/24/2020  . Palliative care by specialist   . Goals of care, counseling/discussion   . DNR (do not resuscitate)   . Weakness   . Advanced directives, counseling/discussion   . Pressure injury of skin 10/18/2019  . Closed fracture of right distal femur ([place]) 10/17/2019  . Femur fracture, right ([place]) 09/25/2019  . Chronic diastolic CHF (congestive heart failure) ([place]) 09/25/2019  . HCAP (healthcare-associated pneumonia) 06/19/2019  . Encephalopathy 06/19/2019  . Lumbar degenerative disc disease   . Paroxysmal A-fib ([place])   . Hypomagnesemia   . C. difficile colitis   . Diarrhea 05/29/2019  . CHF (congestive heart failure) ([place]) 05/29/2019  . Recurrent falls 04/30/2019  . Pericardial effusion without cardiac tamponade 03/29/2019  . Hypoalbuminemia 03/27/2019  . Cardiac tamponade   . Pericarditis 03/06/2019  . Fever   . Septic shock ([place])   . Atypical chest pain   . Atrial fibrillation with rapid ventricular response ([place])   . Nausea 01/17/2019  . Acute diverticulitis 01/13/2019  . Chronic back pain 01/13/2019  . Tachycardia 01/13/2019  . Hypokalemia 01/13/2019  . Chronic low back pain 07/10/2018  . Influenza A 05/20/2017  . Severe sepsis ([place])  05/20/2017  . Acute respiratory failure with hypoxia ([place]) 05/20/2017  . CKD (chronic kidney disease), stage III ([place]) 05/20/2017  . PAF (paroxysmal atrial fibrillation) ([place]) 05/20/2017  . HTN (hypertension) 05/20/2017  . Giant cell arteritis ([place]) 05/20/2017  . Coronary disease 05/20/2017  . Abnormal urinalysis 05/20/2017  . Osteoporosis 05/08/2016  . Herniated nucleus pulposus, L5-S1, right 11/04/2015  . Major depressive disorder, recurrent episode, moderate ([place]) 07/16/2013  . Abdominal pain, epigastric 06/22/2013  . Lumbar stenosis 04/25/2013  . Chronic insomnia 02/07/2013  . Elevated transaminase level 02/07/2013  . CVA (cerebral infarction) 10/29/2012  . Visual changes 10/28/2012  . Depression 10/28/2012  . Anxiety state 10/28/2012  . Anemia, unspecified 10/28/2012  . Nonspecific (abnormal) findings on radiological and other examination of gastrointestinal tract 01/25/2012  . Hyponatremia 01/24/2012  . Hematuria 01/24/2012  . Enteritis 12/24/2011  . Abdominal pain, other specified site 12/24/2011  . Leg pain, right 02/18/2011  . Rheumatoid arthritis ([place]) 02/04/2011  . Temporal arteritis ([place]) 09/01/2010  . Hypertension 09/01/2010  . Hyperlipidemia 09/01/2010  . History of atrial fibrillation 09/01/2010  . CAD (coronary artery disease) 09/01/2010  . GERD (gastroesophageal reflux disease) 09/01/2010  . Insomnia 09/01/2010   Kerin Perna, PTA 06/07/20 5:26 PM  [place], [place], [address] Phone: [phone]   Fax:  [phone]  Name: LEONARDO PLAIA MRN: [id number] Date of Birth: [birth year]

## 2020-06-10 ENCOUNTER — Other Ambulatory Visit: Payer: Self-pay

## 2020-06-10 ENCOUNTER — Ambulatory Visit (HOSPITAL_BASED_OUTPATIENT_CLINIC_OR_DEPARTMENT_OTHER): Payer: Medicare Other | Admitting: Physical Therapy

## 2020-06-10 DIAGNOSIS — M25551 Pain in right hip: Secondary | ICD-10-CM

## 2020-06-10 DIAGNOSIS — R293 Abnormal posture: Secondary | ICD-10-CM

## 2020-06-10 DIAGNOSIS — R262 Difficulty in walking, not elsewhere classified: Secondary | ICD-10-CM | POA: Diagnosis not present

## 2020-06-10 DIAGNOSIS — R2689 Other abnormalities of gait and mobility: Secondary | ICD-10-CM

## 2020-06-10 DIAGNOSIS — M6281 Muscle weakness (generalized): Secondary | ICD-10-CM

## 2020-06-10 NOTE — Therapy (Signed)
[place], [place], [address] Phone: [phone]   Fax:  [phone]  Physical Therapy Treatment  Patient Details  Name: Betty Jackson MRN: [id number] Date of Birth: Jun 02, 1934 Referring Provider (PT): Altamese Kwethluk, MD   Encounter Date: 06/10/2020   PT End of Session - 06/10/20 1239    Visit Number 12    Number of Visits 25    Date for PT Re-Evaluation 07/08/20    Authorization Type [employer] Medicare    Progress Note Due on Visit 20    PT Start Time 1145    PT Stop Time 1230    PT Time Calculation (min) 45 min    Activity Tolerance Patient tolerated treatment well    Behavior During Therapy [facility] for tasks assessed/performed           Past Medical History:  Diagnosis Date  . Acute respiratory failure with hypoxia ([place]) 05/20/2017  . Anemia, unspecified 10/28/2012  . Anxiety state, unspecified 10/28/2012  . CAD (coronary artery disease)    a. Stent RCA in [facility];  b. Cath approx 2009 - nonobs per pt report.  . Cataract    immature on the left eye  . Chronic insomnia 02/07/2013  . Chronic lower back pain    scoliosis  . CKD (chronic kidney disease) stage 3, GFR 30-59 ml/min (HCC)   . CVA (cerebral infarction) 10/29/2012  . DDD (degenerative disc disease)   . Depression   . Diverticulosis   . GERD (gastroesophageal reflux disease) 09/01/2010  . Hemorrhoids   . Herniated nucleus pulposus, L5-S1, right 11/04/2015  . Hyperlipidemia    takes Lipitor daily  . Hypertension    takes Amlodipine,Losartan,Metoprolol,and HCTZ daily  . Incontinence of urine   . Insomnia    takes Restoril nightly  . Lumbar stenosis 04/25/2013  . Major depressive disorder, recurrent episode, moderate ([place]) 07/16/2013  . Osteoporosis   . PAF (paroxysmal atrial fibrillation) ([place]) 2011   a. lone epidode in 2011 according to notes.  . Rheumatoid arthritis ([place])   . Scoliosis   . Sepsis ([place]) 05/2019  . Temporal arteritis  ([place]) 2011   a. followed @ [facility]; potential flareup 10/28/2012/notes 10/28/2012  . Vocal cord dysfunction    "they don't operate properly" (10/28/2012)    Past Surgical History:  Procedure Laterality Date  . ABDOMINAL HYSTERECTOMY  ~ 1984   vaginally  . BACK SURGERY  7-32yr ago   X Stop  . BLADDER SUSPENSION  2001  . BREAST BIOPSY Right   . CATARACT EXTRACTION W/ INTRAOCULAR LENS IMPLANT Right ~ 08/2012  . CHEST TUBE INSERTION Left 03/08/2019   Procedure: Chest Tube Insertion;  Surgeon: VIvin Poot MD;  [place]  Service: Thoracic;  Laterality: Left;  . COLONOSCOPY  01/26/2012   Procedure: COLONOSCOPY;  Surgeon: MLadene Artist MD,FACG;  [facility]  Service: Endoscopy;  Laterality: N/A;  note the EGD is possible  . CORONARY ANGIOPLASTY WITH STENT PLACEMENT  2006   X 1 stent  . EPIDURAL BLOCK INJECTION    . ESOPHAGOGASTRODUODENOSCOPY  01/26/2012   Procedure: ESOPHAGOGASTRODUODENOSCOPY (EGD);  Surgeon: MLadene Artist MD,FACG;  [facility];  Service: Endoscopy;  Laterality: N/A;  . FINE NEEDLE ASPIRATION Right 09/26/2019   Procedure: FINE NEEDLE ASPIRATION;  Surgeon: HAltamese Paint MD;  [place]  Service: Orthopedics;  Laterality: Right;  . HEMIARTHROPLASTY HIP Right 2012  . IR EPIDUROGRAPHY  04/20/2019  . LAPAROSCOPIC CHOLECYSTECTOMY  2001  . LUMBAR FUSION  03/2013  . LUMBAR LAMINECTOMY/DECOMPRESSION MICRODISCECTOMY Right 11/04/2015   Procedure: Right Lumbar Five-Sacral One Microdiskectomy;  Surgeon: Kristeen Miss, MD;  Location: [place] [facility]S;  Service: Neurosurgery;  Laterality: Right;  Right L5-S1 Microdiskectomy  . moles removed that required stiches     one on leg and one on face  . ORIF FEMUR FRACTURE Right 09/26/2019   Procedure: OPEN REDUCTION INTERNAL FIXATION (ORIF) DISTAL FEMUR FRACTURE;  Surgeon: Altamese Louviers, MD;  Location: [place];  Service: Orthopedics;  Laterality: Right;  . ORIF FEMUR FRACTURE Right 10/17/2019   Procedure: OPEN  REDUCTION INTERNAL FIXATION (ORIF) DISTAL FEMUR FRACTURE;  Surgeon: Altamese Force, MD;  Location: [place];  Service: Orthopedics;  Laterality: Right;  . SUBXYPHOID PERICARDIAL WINDOW N/A 03/08/2019   Procedure: SUBXYPHOID PERICARDIAL WINDOW;  Surgeon: Ivin Poot, MD;  Location: [place];  Service: Thoracic;  Laterality: N/A;  . TEE WITHOUT CARDIOVERSION N/A 03/08/2019   Procedure: TRANSESOPHAGEAL ECHOCARDIOGRAM (TEE);  Surgeon: Prescott Gum, Collier Salina, MD;  Location: [place];  Service: Thoracic;  Laterality: N/A;  . TEMPORAL ARTERY BIOPSY / LIGATION Bilateral 2011  . TONSILLECTOMY AND ADENOIDECTOMY     at age 52  . TUBAL LIGATION  ~ 1982  . X-STOP IMPLANTATION  ~ 2010   "lower back" (10/28/2012)    There were no vitals filed for this visit.   Subjective Assessment - 06/10/20 1151    Subjective Pt reports she is feeling stronger. Pt reports nothing new or different otherwise. Pt states this past week that they decreased her caregivers. Pt was able to stay alone at night without any problems for 3 nights.    Patient is accompained by: --   care giver   Pertinent History CAD, hypertension, history of temporal arteritis, atrial fibrillation, GERD, chronic back pain with multiple prior surgeries, osteoarthritis involving multiple joints    Limitations Standing    How long can you sit comfortably? N/a    How long can you stand comfortably? Not long    How long can you walk comfortably? 20' with RW (PLOF)    Patient Stated Goals Improve mobility and strength    Currently in Pain? Yes    Pain Score 3                              [facility] Adult PT Treatment/Exercise - 06/10/20 0001      Ambulation/Gait   Ambulation/Gait Yes    Ambulation Distance (Feet) 100 Feet    Assistive device Rolling walker    Gait Pattern Step-through pattern;Decreased weight shift to right;Decreased step length - right;Decreased step length - left;Right flexed knee in stance;Left flexed knee in stance;Trunk  flexed    Ambulation Surface Level;Indoor    Gait Comments Cues to improve quad activation and improve knee extension during stance phase; cues to keep trunk extended      Knee/Hip Exercises: Stretches   Sports administrator Both;30 seconds    Gastroc Stretch Right;Left;20 seconds    Soleus Stretch Right;Left;1 rep;20 seconds      Knee/Hip Exercises: Aerobic   Nustep L5 x 5 min      Knee/Hip Exercises: Standing   Terminal Knee Extension Strengthening;Both;10 reps    Theraband Level (Terminal Knee Extension) --   Orange tband   Terminal Knee Extension Limitations Cues to keep trunk extension    Other Standing Knee Exercises Single leg standing 10x3 sec hold with UE support  Knee/Hip Exercises: Seated   Sit to Sand without UE support;5 reps;2 sets   Focus on eccentric descent     Knee/Hip Exercises: Prone   Hamstring Curl 10 reps;2 sets                    PT Short Term Goals - 06/10/20 1157      PT SHORT TERM GOAL #1   Title Pt will be independent with HEP    Time 4    Period Weeks    Status Achieved    Target Date 06/10/20      PT SHORT TERM GOAL #2   Title Pt will be able to perform 5x STS in <15 sec to demo improved functional strength    Baseline 19 sec; 15 sec 06/10/20    Time 4    Period Weeks    Status Achieved    Target Date 06/10/20      PT SHORT TERM GOAL #3   Title Pt will be able to decrease TUG score by at least 10 sec to demo MCID    Baseline 40 sec; 28 sec on 06/10/20    Time 4    Period Weeks    Status Achieved    Target Date 06/10/20      PT SHORT TERM GOAL #4   Title Pt will be fully independent with transfers    Baseline Requires min A at times    Time 4    Period Weeks    Status Achieved    Target Date 06/10/20             PT Long Term Goals - 05/13/20 1411      PT LONG TERM GOAL #1   Title Pt will be independent with maintaining mobility at home    Time 8    Period Weeks    Status New    Target Date 07/08/20      PT LONG  TERM GOAL #2   Title Pt will have improved 5 x STS to </=13 sec to demo decreased fall risk    Baseline 19 sec    Time 8    Period Weeks    Status New    Target Date 07/08/20      PT LONG TERM GOAL #3   Title Pt will have improved TUG score to </=20 sec    Baseline 40 sec    Time 8    Period Weeks    Status New    Target Date 07/08/20      PT LONG TERM GOAL #4   Title Pt will have improved Berg Score to at least 37/56 to demo [place] for decreased fall risk    Baseline 29/56    Time 8    Period Weeks    Status New    Target Date 07/08/20                 Plan - 06/10/20 1240    Clinical Impression Statement STGs checked -- pt has met all STGs; however, pt does have difficulty with controlled descent from standing to sitting without use of UEs. Treatment session focused on increasing pt's tolerance to standing and full upright posture. Continued stretching of her quads and calves. Increased pt's ambulation distance this session to work on her endurance.    Personal Factors and Comorbidities Age;Fitness;Comorbidity 1;Comorbidity 2;Comorbidity 3+;Time since onset of injury/illness/exacerbation    Comorbidities CAD, hypertension, history of temporal arteritis, atrial fibrillation, GERD,  chronic back pain with multiple prior surgeries, osteoarthritis involving multiple joints    Examination-Activity Limitations Bed Mobility;Bend;Locomotion Level;Transfers;Dressing;Stand;Squat    Examination-Participation Restrictions Community Activity;Cleaning;Shop;Laundry    Stability/Clinical Decision Making Evolving/Moderate complexity    Rehab Potential Good    PT Frequency 3x / week   2x/wk on land; 1x/wk aquatics   PT Duration 8 weeks    PT Treatment/Interventions Aquatic Therapy;ADLs/Self Care Home Management;Cryotherapy;Electrical Stimulation;Biofeedback;Moist Heat;DME Instruction;Gait training;Stair training;Functional mobility training;Therapeutic activities;Therapeutic exercise;Balance  training;Neuromuscular re-education;Manual techniques;Patient/family education;Orthotic Fit/Training;Dry needling;Taping;Vasopneumatic Device    PT Next Visit Plan Land: Continue to work on glute/hip extensor and quad strengthening. Work on endurance in upright posture and gait. Aquatics:  continue stretches for LLE and balance on LLE.  Utilize aquatic floatation walker for gait trials.  short trials of steps for LE strengthening.    PT Home Exercise Plan Access Code: [email or web address]    Consulted and Agree with Plan of Care Patient;Family member/caregiver    Family Member Consulted caregiver           Patient will benefit from skilled therapeutic intervention in order to improve the following deficits and impairments:  Abnormal gait,Decreased range of motion,Difficulty walking,Decreased endurance,Decreased activity tolerance,Pain,Decreased balance,Hypomobility,Improper body mechanics,Decreased mobility,Decreased strength,Postural dysfunction  Visit Diagnosis: Muscle weakness (generalized)  Abnormal posture  Pain in right hip  Difficulty in walking, not elsewhere classified  Other abnormalities of gait and mobility     Problem List Patient Active Problem List   Diagnosis Date Noted  . Cough 05/24/2020  . Dyspnea 05/24/2020  . Optic neuritis 05/24/2020  . Osteopenia 05/24/2020  . Rash 05/24/2020  . Vitamin D deficiency 05/24/2020  . Palliative care by specialist   . Goals of care, counseling/discussion   . DNR (do not resuscitate)   . Weakness   . Advanced directives, counseling/discussion   . Pressure injury of skin 10/18/2019  . Closed fracture of right distal femur ([place]) 10/17/2019  . Femur fracture, right ([place]) 09/25/2019  . Chronic diastolic CHF (congestive heart failure) ([place]) 09/25/2019  . HCAP (healthcare-associated pneumonia) 06/19/2019  . Encephalopathy 06/19/2019  . Lumbar degenerative disc disease   . Paroxysmal A-fib ([place])   . Hypomagnesemia   . C. difficile  colitis   . Diarrhea 05/29/2019  . CHF (congestive heart failure) ([place]) 05/29/2019  . Recurrent falls 04/30/2019  . Pericardial effusion without cardiac tamponade 03/29/2019  . Hypoalbuminemia 03/27/2019  . Cardiac tamponade   . Pericarditis 03/06/2019  . Fever   . Septic shock ([place])   . Atypical chest pain   . Atrial fibrillation with rapid ventricular response ([place])   . Nausea 01/17/2019  . Acute diverticulitis 01/13/2019  . Chronic back pain 01/13/2019  . Tachycardia 01/13/2019  . Hypokalemia 01/13/2019  . Chronic low back pain 07/10/2018  . Influenza A 05/20/2017  . Severe sepsis ([place]) 05/20/2017  . Acute respiratory failure with hypoxia ([place]) 05/20/2017  . CKD (chronic kidney disease), stage III ([place]) 05/20/2017  . PAF (paroxysmal atrial fibrillation) ([place]) 05/20/2017  . HTN (hypertension) 05/20/2017  . Giant cell arteritis ([place]) 05/20/2017  . Coronary disease 05/20/2017  . Abnormal urinalysis 05/20/2017  . Osteoporosis 05/08/2016  . Herniated nucleus pulposus, L5-S1, right 11/04/2015  . Major depressive disorder, recurrent episode, moderate ([place]) 07/16/2013  . Abdominal pain, epigastric 06/22/2013  . Lumbar stenosis 04/25/2013  . Chronic insomnia 02/07/2013  . Elevated transaminase level 02/07/2013  . CVA (cerebral infarction) 10/29/2012  . Visual changes 10/28/2012  . Depression 10/28/2012  . Anxiety state 10/28/2012  . Anemia,  unspecified 10/28/2012  . Nonspecific (abnormal) findings on radiological and other examination of gastrointestinal tract 01/25/2012  . Hyponatremia 01/24/2012  . Hematuria 01/24/2012  . Enteritis 12/24/2011  . Abdominal pain, other specified site 12/24/2011  . Leg pain, right 02/18/2011  . Rheumatoid arthritis ([place]) 02/04/2011  . Temporal arteritis ([place]) 09/01/2010  . Hypertension 09/01/2010  . Hyperlipidemia 09/01/2010  . History of atrial fibrillation 09/01/2010  . CAD (coronary artery disease) 09/01/2010  . GERD (gastroesophageal  reflux disease) 09/01/2010  . Insomnia 09/01/2010    Shanikqua Zarzycki April Ma L Faelynn Wynder PT, DPT 06/10/2020, 12:44 PM  [facility] [address] [place], [place], [address] Phone: [phone]   Fax:  [phone]  Name: ZEMIRA ZEHRING MRN: [id number] Date of Birth: [birth year]

## 2020-06-12 ENCOUNTER — Telehealth: Payer: Self-pay | Admitting: Family Medicine

## 2020-06-12 ENCOUNTER — Other Ambulatory Visit: Payer: Self-pay

## 2020-06-12 ENCOUNTER — Ambulatory Visit (HOSPITAL_BASED_OUTPATIENT_CLINIC_OR_DEPARTMENT_OTHER): Payer: Medicare Other | Admitting: Physical Therapy

## 2020-06-12 DIAGNOSIS — M6281 Muscle weakness (generalized): Secondary | ICD-10-CM | POA: Diagnosis not present

## 2020-06-12 DIAGNOSIS — R262 Difficulty in walking, not elsewhere classified: Secondary | ICD-10-CM | POA: Diagnosis not present

## 2020-06-12 DIAGNOSIS — M069 Rheumatoid arthritis, unspecified: Secondary | ICD-10-CM

## 2020-06-12 DIAGNOSIS — R2689 Other abnormalities of gait and mobility: Secondary | ICD-10-CM

## 2020-06-12 DIAGNOSIS — R293 Abnormal posture: Secondary | ICD-10-CM | POA: Diagnosis not present

## 2020-06-12 DIAGNOSIS — M5136 Other intervertebral disc degeneration, lumbar region: Secondary | ICD-10-CM

## 2020-06-12 DIAGNOSIS — M810 Age-related osteoporosis without current pathological fracture: Secondary | ICD-10-CM

## 2020-06-12 DIAGNOSIS — M25551 Pain in right hip: Secondary | ICD-10-CM | POA: Diagnosis not present

## 2020-06-12 NOTE — Telephone Encounter (Signed)
Betty Jackson is calling and wanted to see if the provider can put in an order for patient to get another transport chair because the one she has is old and falling apart, please advise. CB is [phone]

## 2020-06-12 NOTE — Therapy (Signed)
[facility] [place], [place], [address] Phone: [phone]   Fax:  [phone]  Physical Therapy Treatment  Patient Details  Name: Betty Jackson MRN: [id number] Date of Birth: 18-Mar-1935 Referring Provider (PT): Altamese Pomeroy, MD   Encounter Date: 06/12/2020   PT End of Session - 06/12/20 1235    Visit Number 13    Number of Visits 25    Date for PT Re-Evaluation 07/08/20    Authorization Type [employer] Medicare    Progress Note Due on Visit 20    PT Start Time 1150    PT Stop Time 1230    PT Time Calculation (min) 40 min    Activity Tolerance Patient tolerated treatment well    Behavior During Therapy [facility] for tasks assessed/performed           Past Medical History:  Diagnosis Date  . Acute respiratory failure with hypoxia ([place]) 05/20/2017  . Anemia, unspecified 10/28/2012  . Anxiety state, unspecified 10/28/2012  . CAD (coronary artery disease)    a. Stent RCA in [facility];  b. Cath approx 2009 - nonobs per pt report.  . Cataract    immature on the left eye  . Chronic insomnia 02/07/2013  . Chronic lower back pain    scoliosis  . CKD (chronic kidney disease) stage 3, GFR 30-59 ml/min (HCC)   . CVA (cerebral infarction) 10/29/2012  . DDD (degenerative disc disease)   . Depression   . Diverticulosis   . GERD (gastroesophageal reflux disease) 09/01/2010  . Hemorrhoids   . Herniated nucleus pulposus, L5-S1, right 11/04/2015  . Hyperlipidemia    takes Lipitor daily  . Hypertension    takes Amlodipine,Losartan,Metoprolol,and HCTZ daily  . Incontinence of urine   . Insomnia    takes Restoril nightly  . Lumbar stenosis 04/25/2013  . Major depressive disorder, recurrent episode, moderate ([place]) 07/16/2013  . Osteoporosis   . PAF (paroxysmal atrial fibrillation) ([place]) 2011   a. lone epidode in 2011 according to notes.  . Rheumatoid arthritis ([place])   . Scoliosis   . Sepsis ([place]) 05/2019  . Temporal arteritis  ([place]) 2011   a. followed @ [facility]; potential flareup 10/28/2012/notes 10/28/2012  . Vocal cord dysfunction    "they don't operate properly" (10/28/2012)    Past Surgical History:  Procedure Laterality Date  . ABDOMINAL HYSTERECTOMY  ~ 1984   vaginally  . BACK SURGERY  7-61yrs ago   X Stop  . BLADDER SUSPENSION  2001  . BREAST BIOPSY Right   . CATARACT EXTRACTION W/ INTRAOCULAR LENS IMPLANT Right ~ 08/2012  . CHEST TUBE INSERTION Left 03/08/2019   Procedure: Chest Tube Insertion;  Surgeon: Ivin Poot, MD;  Location: [place];  Service: Thoracic;  Laterality: Left;  . COLONOSCOPY  01/26/2012   Procedure: COLONOSCOPY;  Surgeon: Ladene Artist, MD,FACG;  Location[facility] ENDOSCOPY;  Service: Endoscopy;  Laterality: N/A;  note the EGD is possible  . CORONARY ANGIOPLASTY WITH STENT PLACEMENT  2006   X 1 stent  . EPIDURAL BLOCK INJECTION    . ESOPHAGOGASTRODUODENOSCOPY  01/26/2012   Procedure: ESOPHAGOGASTRODUODENOSCOPY (EGD);  Surgeon: Ladene Artist, MD,FACG;  Location: [facility] ENDOSCOPY;  Service: Endoscopy;  Laterality: N/A;  . FINE NEEDLE ASPIRATION Right 09/26/2019   Procedure: FINE NEEDLE ASPIRATION;  Surgeon: Altamese Brazoria, MD;  Location: [place];  Service: Orthopedics;  Laterality: Right;  . HEMIARTHROPLASTY HIP Right 2012  . IR EPIDUROGRAPHY  04/20/2019  . LAPAROSCOPIC CHOLECYSTECTOMY  2001  . LUMBAR FUSION  03/2013  . LUMBAR LAMINECTOMY/DECOMPRESSION MICRODISCECTOMY Right 11/04/2015   Procedure: Right Lumbar Five-Sacral One Microdiskectomy;  Surgeon: Kristeen Miss, MD;  Location: [facility] NEURO ORS;  Service: Neurosurgery;  Laterality: Right;  Right L5-S1 Microdiskectomy  . moles removed that required stiches     one on leg and one on face  . ORIF FEMUR FRACTURE Right 09/26/2019   Procedure: OPEN REDUCTION INTERNAL FIXATION (ORIF) DISTAL FEMUR FRACTURE;  Surgeon: Altamese Woodbine, MD;  Location: [facility];  Service: Orthopedics;  Laterality: Right;  . ORIF FEMUR FRACTURE Right 10/17/2019   Procedure: OPEN  REDUCTION INTERNAL FIXATION (ORIF) DISTAL FEMUR FRACTURE;  Surgeon: Altamese Penhook, MD;  Location: [place] [facility];  Service: Orthopedics;  Laterality: Right;  . SUBXYPHOID PERICARDIAL WINDOW N/A 03/08/2019   Procedure: SUBXYPHOID PERICARDIAL WINDOW;  Surgeon: Ivin Poot, MD;  Location: [facility];  Service: Thoracic;  Laterality: N/A;  . TEE WITHOUT CARDIOVERSION N/A 03/08/2019   Procedure: TRANSESOPHAGEAL ECHOCARDIOGRAM (TEE);  Surgeon: Prescott Gum, Collier Salina, MD;  Location: [place];  Service: Thoracic;  Laterality: N/A;  . TEMPORAL ARTERY BIOPSY / LIGATION Bilateral 2011  . TONSILLECTOMY AND ADENOIDECTOMY     at age 60  . TUBAL LIGATION  ~ 1982  . X-STOP IMPLANTATION  ~ 2010   "lower back" (10/28/2012)    There were no vitals filed for this visit.   Subjective Assessment - 06/12/20 1153    Subjective Pt states her R buttock is hurting this morning. She took 2 tylenol and it's already starting to feel better. Pt reports she's been trying to extend her time in standing positions.    Patient is accompained by: --   care giver   Pertinent History CAD, hypertension, history of temporal arteritis, atrial fibrillation, GERD, chronic back pain with multiple prior surgeries, osteoarthritis involving multiple joints    Limitations Standing    How long can you sit comfortably? N/a    How long can you stand comfortably? Not long    How long can you walk comfortably? 37' with RW (PLOF)    Patient Stated Goals Improve mobility and strength    Currently in Pain? Yes    Pain Score 3     Pain Location Buttocks    Pain Orientation Right                             [facility] Adult PT Treatment/Exercise - 06/12/20 0001      Knee/Hip Exercises: Stretches   Quad Stretch Both;30 seconds    Gastroc Stretch Right;Left;20 seconds      Knee/Hip Exercises: Aerobic   Nustep L3 x 5 min      Knee/Hip Exercises: Prone   Hamstring Curl 10 reps;2 sets    Hamstring Curl Limitations orange tband    Hip Extension  Strengthening;Both;2 sets;10 reps    Hip Extension Limitations Eccentrics; pt unable to perform concentric contraction      Manual Therapy   Manual Therapy Joint mobilization    Joint Mobilization grade II to III Talocrural distraction and PA mobs for ankle dorsiflexion                    PT Short Term Goals - 06/10/20 1157      PT SHORT TERM GOAL #1   Title Pt will be independent with HEP    Time 4    Period Weeks    Status Achieved    Target  Date 06/10/20      PT SHORT TERM GOAL #2   Title Pt will be able to perform 5x STS in <15 sec to demo improved functional strength    Baseline 19 sec; 15 sec 06/10/20    Time 4    Period Weeks    Status Achieved    Target Date 06/10/20      PT SHORT TERM GOAL #3   Title Pt will be able to decrease TUG score by at least 10 sec to demo MCID    Baseline 40 sec; 28 sec on 06/10/20    Time 4    Period Weeks    Status Achieved    Target Date 06/10/20      PT SHORT TERM GOAL #4   Title Pt will be fully independent with transfers    Baseline Requires min A at times    Time 4    Period Weeks    Status Achieved    Target Date 06/10/20             PT Long Term Goals - 05/13/20 1411      PT LONG TERM GOAL #1   Title Pt will be independent with maintaining mobility at home    Time 8    Period Weeks    Status New    Target Date 07/08/20      PT LONG TERM GOAL #2   Title Pt will have improved 5 x STS to </=13 sec to demo decreased fall risk    Baseline 19 sec    Time 8    Period Weeks    Status New    Target Date 07/08/20      PT LONG TERM GOAL #3   Title Pt will have improved TUG score to </=20 sec    Baseline 40 sec    Time 8    Period Weeks    Status New    Target Date 07/08/20      PT LONG TERM GOAL #4   Title Pt will have improved Berg Score to at least 37/56 to demo [place] for decreased fall risk    Baseline 29/56    Time 8    Period Weeks    Status New    Target Date 07/08/20                  Plan - 06/12/20 1233    Clinical Impression Statement Treatment session focused on continuing to improve pt's hip extensor and hamstrings. Pt fatigued after performing exercises in prone. Provided manual therapy, stretching, and strengthening to improve L ankle DF for improved stability in standing.    Personal Factors and Comorbidities Age;Fitness;Comorbidity 1;Comorbidity 2;Comorbidity 3+;Time since onset of injury/illness/exacerbation    Comorbidities CAD, hypertension, history of temporal arteritis, atrial fibrillation, GERD, chronic back pain with multiple prior surgeries, osteoarthritis involving multiple joints    Examination-Activity Limitations Bed Mobility;Bend;Locomotion Level;Transfers;Dressing;Stand;Squat    Examination-Participation Restrictions Community Activity;Cleaning;Shop;Laundry    Stability/Clinical Decision Making Evolving/Moderate complexity    Rehab Potential Good    PT Frequency 3x / week   2x/wk on land; 1x/wk aquatics   PT Duration 8 weeks    PT Treatment/Interventions Aquatic Therapy;ADLs/Self Care Home Management;Cryotherapy;Electrical Stimulation;Biofeedback;Moist Heat;DME Instruction;Gait training;Stair training;Functional mobility training;Therapeutic activities;Therapeutic exercise;Balance training;Neuromuscular re-education;Manual techniques;Patient/family education;Orthotic Fit/Training;Dry needling;Taping;Vasopneumatic Device    PT Next Visit Plan Land: Continue to work on glute/hip extensor and quad strengthening. Manual therapy & strengthening as needed for L ankle DF. Work on endurance  in upright posture and gait. Aquatics:  continue stretches for LLE and balance on LLE.  Utilize aquatic floatation walker for gait trials.  short trials of steps for LE strengthening.    PT Home Exercise Plan Access Code: [email or web address]    Consulted and Agree with Plan of Care Patient;Family member/caregiver    Family Member Consulted caregiver           Patient will benefit from  skilled therapeutic intervention in order to improve the following deficits and impairments:  Abnormal gait,Decreased range of motion,Difficulty walking,Decreased endurance,Decreased activity tolerance,Pain,Decreased balance,Hypomobility,Improper body mechanics,Decreased mobility,Decreased strength,Postural dysfunction  Visit Diagnosis: Muscle weakness (generalized)  Abnormal posture  Pain in right hip  Difficulty in walking, not elsewhere classified  Other abnormalities of gait and mobility     Problem List Patient Active Problem List   Diagnosis Date Noted  . Cough 05/24/2020  . Dyspnea 05/24/2020  . Optic neuritis 05/24/2020  . Osteopenia 05/24/2020  . Rash 05/24/2020  . Vitamin D deficiency 05/24/2020  . Palliative care by specialist   . Goals of care, counseling/discussion   . DNR (do not resuscitate)   . Weakness   . Advanced directives, counseling/discussion   . Pressure injury of skin 10/18/2019  . Closed fracture of right distal femur ([place]) 10/17/2019  . Femur fracture, right ([place]) 09/25/2019  . Chronic diastolic CHF (congestive heart failure) ([place]) 09/25/2019  . HCAP (healthcare-associated pneumonia) 06/19/2019  . Encephalopathy 06/19/2019  . Lumbar degenerative disc disease   . Paroxysmal A-fib ([place])   . Hypomagnesemia   . C. difficile colitis   . Diarrhea 05/29/2019  . CHF (congestive heart failure) ([place]) 05/29/2019  . Recurrent falls 04/30/2019  . Pericardial effusion without cardiac tamponade 03/29/2019  . Hypoalbuminemia 03/27/2019  . Cardiac tamponade   . Pericarditis 03/06/2019  . Fever   . Septic shock ([place])   . Atypical chest pain   . Atrial fibrillation with rapid ventricular response ([place])   . Nausea 01/17/2019  . Acute diverticulitis 01/13/2019  . Chronic back pain 01/13/2019  . Tachycardia 01/13/2019  . Hypokalemia 01/13/2019  . Chronic low back pain 07/10/2018  . Influenza A 05/20/2017  . Severe sepsis ([place]) 05/20/2017  . Acute  respiratory failure with hypoxia ([place]) 05/20/2017  . CKD (chronic kidney disease), stage III ([place]) 05/20/2017  . PAF (paroxysmal atrial fibrillation) ([place]) 05/20/2017  . HTN (hypertension) 05/20/2017  . Giant cell arteritis ([place]) 05/20/2017  . Coronary disease 05/20/2017  . Abnormal urinalysis 05/20/2017  . Osteoporosis 05/08/2016  . Herniated nucleus pulposus, L5-S1, right 11/04/2015  . Major depressive disorder, recurrent episode, moderate ([place]) 07/16/2013  . Abdominal pain, epigastric 06/22/2013  . Lumbar stenosis 04/25/2013  . Chronic insomnia 02/07/2013  . Elevated transaminase level 02/07/2013  . CVA (cerebral infarction) 10/29/2012  . Visual changes 10/28/2012  . Depression 10/28/2012  . Anxiety state 10/28/2012  . Anemia, unspecified 10/28/2012  . Nonspecific (abnormal) findings on radiological and other examination of gastrointestinal tract 01/25/2012  . Hyponatremia 01/24/2012  . Hematuria 01/24/2012  . Enteritis 12/24/2011  . Abdominal pain, other specified site 12/24/2011  . Leg pain, right 02/18/2011  . Rheumatoid arthritis ([place]) 02/04/2011  . Temporal arteritis ([place]) 09/01/2010  . Hypertension 09/01/2010  . Hyperlipidemia 09/01/2010  . History of atrial fibrillation 09/01/2010  . CAD (coronary artery disease) 09/01/2010  . GERD (gastroesophageal reflux disease) 09/01/2010  . Insomnia 09/01/2010    Gellen April Ma L Nonato PT, DPT 06/12/2020, 12:36 PM  [facility]  [place], [place], [address] Phone: [phone]   Fax:  [phone]  Name: KIMONI PICKERILL MRN: [id number] Date of Birth: 1934-11-18

## 2020-06-12 NOTE — Telephone Encounter (Signed)
Okay to order?

## 2020-06-12 NOTE — Telephone Encounter (Signed)
Okay for order?

## 2020-06-14 ENCOUNTER — Other Ambulatory Visit: Payer: Self-pay

## 2020-06-14 ENCOUNTER — Encounter (HOSPITAL_BASED_OUTPATIENT_CLINIC_OR_DEPARTMENT_OTHER): Payer: Self-pay | Admitting: Physical Therapy

## 2020-06-14 ENCOUNTER — Ambulatory Visit (HOSPITAL_BASED_OUTPATIENT_CLINIC_OR_DEPARTMENT_OTHER): Payer: Medicare Other | Admitting: Physical Therapy

## 2020-06-14 DIAGNOSIS — M6281 Muscle weakness (generalized): Secondary | ICD-10-CM

## 2020-06-14 DIAGNOSIS — R293 Abnormal posture: Secondary | ICD-10-CM | POA: Diagnosis not present

## 2020-06-14 DIAGNOSIS — R262 Difficulty in walking, not elsewhere classified: Secondary | ICD-10-CM

## 2020-06-14 DIAGNOSIS — M25551 Pain in right hip: Secondary | ICD-10-CM

## 2020-06-14 DIAGNOSIS — R2689 Other abnormalities of gait and mobility: Secondary | ICD-10-CM | POA: Diagnosis not present

## 2020-06-14 MED ORDER — FENTANYL 25 MCG/HR TD PT72: MEDICATED_PATCH | TRANSDERMAL | 0 refills | Status: DC

## 2020-06-14 MED ORDER — ALENDRONATE SODIUM 70 MG PO TABS: 70.0000 mg | ORAL_TABLET | ORAL | 11 refills | Status: DC

## 2020-06-14 MED ORDER — FENTANYL 25 MCG/HR TD PT72: 1.0000 | MEDICATED_PATCH | TRANSDERMAL | 0 refills | Status: DC

## 2020-06-14 NOTE — Telephone Encounter (Signed)
Sent!

## 2020-06-14 NOTE — Addendum Note (Signed)
Addended by: Rodrigo Ran on: 06/14/2020 01:18 PM   Modules accepted: Orders

## 2020-06-14 NOTE — Telephone Encounter (Signed)
Okay to fill? 

## 2020-06-14 NOTE — Therapy (Signed)
[place], [place], [address] Phone: [phone]   Fax:  [phone]  Physical Therapy Treatment  Patient Details  Name: Betty Jackson MRN: [id number] Date of Birth: 11-06-1934 Referring Provider (PT): Altamese [place], MD   Encounter Date: 06/14/2020   PT End of Session - 06/14/20 1701    Visit Number 14    Number of Visits 25    Date for PT Re-Evaluation 07/08/20    Authorization Type [employer] Medicare    Progress Note Due on Visit 20    PT Start Time 1445    PT Stop Time 1525    PT Time Calculation (min) 40 min    Activity Tolerance Patient tolerated treatment well    Behavior During Therapy [facility] for tasks assessed/performed           Past Medical History:  Diagnosis Date  . Acute respiratory failure with hypoxia ([place]) 05/20/2017  . Anemia, unspecified 10/28/2012  . Anxiety state, unspecified 10/28/2012  . CAD (coronary artery disease)    a. Stent RCA in [facility];  b. Cath approx 2009 - nonobs per pt report.  . Cataract    immature on the left eye  . Chronic insomnia 02/07/2013  . Chronic lower back pain    scoliosis  . CKD (chronic kidney disease) stage 3, GFR 30-59 ml/min (HCC)   . CVA (cerebral infarction) 10/29/2012  . DDD (degenerative disc disease)   . Depression   . Diverticulosis   . GERD (gastroesophageal reflux disease) 09/01/2010  . Hemorrhoids   . Herniated nucleus pulposus, L5-S1, right 11/04/2015  . Hyperlipidemia    takes Lipitor daily  . Hypertension    takes Amlodipine,Losartan,Metoprolol,and HCTZ daily  . Incontinence of urine   . Insomnia    takes Restoril nightly  . Lumbar stenosis 04/25/2013  . Major depressive disorder, recurrent episode, moderate ([place]) 07/16/2013  . Osteoporosis   . PAF (paroxysmal atrial fibrillation) ([place]) 2011   a. lone epidode in 2011 according to notes.  . Rheumatoid arthritis ([place])   . Scoliosis   . Sepsis ([place]) 05/2019  . Temporal arteritis  ([place]) 2011   a. followed @ [facility]; potential flareup 10/28/2012/notes 10/28/2012  . Vocal cord dysfunction    "they don't operate properly" (10/28/2012)    Past Surgical History:  Procedure Laterality Date  . ABDOMINAL HYSTERECTOMY  ~ 1984   vaginally  . BACK SURGERY  7-28yrs ago   X Stop  . BLADDER SUSPENSION  2001  . BREAST BIOPSY Right   . CATARACT EXTRACTION W/ INTRAOCULAR LENS IMPLANT Right ~ 08/2012  . CHEST TUBE INSERTION Left 03/08/2019   Procedure: Chest Tube Insertion;  Surgeon: Ivin Poot, MD;  Location: [facility];  Service: Thoracic;  Laterality: Left;  . COLONOSCOPY  01/26/2012   Procedure: COLONOSCOPY;  Surgeon: Ladene Artist, MD,FACG;  Location: [facility] ENDOSCOPY;  Service: Endoscopy;  Laterality: N/A;  note the EGD is possible  . CORONARY ANGIOPLASTY WITH STENT PLACEMENT  2006   X 1 stent  . EPIDURAL BLOCK INJECTION    . ESOPHAGOGASTRODUODENOSCOPY  01/26/2012   Procedure: ESOPHAGOGASTRODUODENOSCOPY (EGD);  Surgeon: Ladene Artist, MD,FACG;  Location: [facility] ENDOSCOPY;  Service: Endoscopy;  Laterality: N/A;  . FINE NEEDLE ASPIRATION Right 09/26/2019   Procedure: FINE NEEDLE ASPIRATION;  Surgeon: Altamese [place], MD;  Location: [facility];  Service: Orthopedics;  Laterality: Right;  . HEMIARTHROPLASTY HIP Right 2012  . IR EPIDUROGRAPHY  04/20/2019  . LAPAROSCOPIC CHOLECYSTECTOMY  2001  . LUMBAR FUSION  03/2013  . LUMBAR LAMINECTOMY/DECOMPRESSION MICRODISCECTOMY Right 11/04/2015   Procedure: Right Lumbar Five-Sacral One Microdiskectomy;  Surgeon: Kristeen Miss, MD;  Location: [facility] NEURO ORS;  Service: Neurosurgery;  Laterality: Right;  Right L5-S1 Microdiskectomy  . moles removed that required stiches     one on leg and one on face  . ORIF FEMUR FRACTURE Right 09/26/2019   Procedure: OPEN REDUCTION INTERNAL FIXATION (ORIF) DISTAL FEMUR FRACTURE;  Surgeon: Altamese Adrian, MD;  Location: [facility];  Service: Orthopedics;  Laterality: Right;  . ORIF FEMUR FRACTURE Right 10/17/2019   Procedure: OPEN  REDUCTION INTERNAL FIXATION (ORIF) DISTAL FEMUR FRACTURE;  Surgeon: Altamese [place], MD;  Location: [facility];  Service: Orthopedics;  Laterality: Right;  . SUBXYPHOID PERICARDIAL WINDOW N/A 03/08/2019   Procedure: SUBXYPHOID PERICARDIAL WINDOW;  Surgeon: Ivin Poot, MD;  Location: [facility];  Service: Thoracic;  Laterality: N/A;  . TEE WITHOUT CARDIOVERSION N/A 03/08/2019   Procedure: TRANSESOPHAGEAL ECHOCARDIOGRAM (TEE);  Surgeon: Prescott Gum, Collier Salina, MD;  Location: [facility];  Service: Thoracic;  Laterality: N/A;  . TEMPORAL ARTERY BIOPSY / LIGATION Bilateral 2011  . TONSILLECTOMY AND ADENOIDECTOMY     at age 56  . TUBAL LIGATION  ~ 1982  . X-STOP IMPLANTATION  ~ 2010   "lower back" (10/28/2012)    There were no vitals filed for this visit.   Subjective Assessment - 06/14/20 1702    Subjective "My whole Rt leg hurt yesterday.  That's unusual.  I had to take 2 pain pills to get it to calm down".    Patient is accompained by: --   caregiver   Pertinent History CAD, hypertension, history of temporal arteritis, atrial fibrillation, GERD, chronic back pain with multiple prior surgeries, osteoarthritis involving multiple joints    Currently in Pain? Yes    Pain Score 4     Pain Location Hip    Pain Orientation Right;Posterior    Pain Descriptors / Indicators Sore;Tightness    Aggravating Factors  standing, walking    Pain Relieving Factors sitting           Pt seen for aquatic therapy today.  Treatment took place in water 3.25-4 ft in depth at the [employer] pool. Temp of water was 91.  Pt entered/exited the pool via stairs with CGA with bilat rail.  Treatment:  Seated:  LAQ x 10, ankle circles CW/CCW x 10.  Sit to/from stand to elevated bench in water without UE support x 10  Holding onto wall: Hip ext x 10 each, hip abdct x 10 each leg. Heel raises x 10. Lt/Rt calf stretch (runners stretch) x 15 sec x 2 reps each leg.   Supine: leg press off of wall with therapist support x 10.   Floating with head on therapist with gentle side bends for stretch, and TA engagement with circles.   Gait with aqua jogger barbell:  Forward / backward / side stepping - multiple reps of 18 ft, decreasing therapist support at forearms. Cues for upright posture. Tactile cues to encourage increased hip ext during stance to toe-off phase.    Pt requires buoyancy for support and to offload joints with strengthening exercises. Viscosity of the water is needed for resistance of strengthening; water current perturbations provides challenge to standing balance unsupported, requiring increased core activation.      PT Short Term Goals - 06/10/20 1157      PT SHORT TERM GOAL #1   Title Pt will be independent  with HEP    Time 4    Period Weeks    Status Achieved    Target Date 06/10/20      PT SHORT TERM GOAL #2   Title Pt will be able to perform 5x STS in <15 sec to demo improved functional strength    Baseline 19 sec; 15 sec 06/10/20    Time 4    Period Weeks    Status Achieved    Target Date 06/10/20      PT SHORT TERM GOAL #3   Title Pt will be able to decrease TUG score by at least 10 sec to demo MCID    Baseline 40 sec; 28 sec on 06/10/20    Time 4    Period Weeks    Status Achieved    Target Date 06/10/20      PT SHORT TERM GOAL #4   Title Pt will be fully independent with transfers    Baseline Requires min A at times    Time 4    Period Weeks    Status Achieved    Target Date 06/10/20             PT Long Term Goals - 05/13/20 1411      PT LONG TERM GOAL #1   Title Pt will be independent with maintaining mobility at home    Time 8    Period Weeks    Status New    Target Date 07/08/20      PT LONG TERM GOAL #2   Title Pt will have improved 5 x STS to </=13 sec to demo decreased fall risk    Baseline 19 sec    Time 8    Period Weeks    Status New    Target Date 07/08/20      PT LONG TERM GOAL #3   Title Pt will have improved TUG score to </=20 sec     Baseline 40 sec    Time 8    Period Weeks    Status New    Target Date 07/08/20      PT LONG TERM GOAL #4   Title Pt will have improved Berg Score to at least 37/56 to demo [place] for decreased fall risk    Baseline 29/56    Time 8    Period Weeks    Status New    Target Date 07/08/20                 Plan - 06/14/20 1703    Clinical Impression Statement Pt demonstrates decreased Lt hip ext and DF with gait trials, flexes through knee instead of hip. Tactile cues to increase upright posture and more neutral spine.  Pt reported pain remained unchanged during session. Increasing time spent with gait trials with floatation assistance (barbell).Encouraged pt to work on standing tolerance with walker outside of therapy sessions.  Pt making gradual progress towards LTGs.    Personal Factors and Comorbidities Age;Fitness;Comorbidity 1;Comorbidity 2;Comorbidity 3+;Time since onset of injury/illness/exacerbation    Comorbidities CAD, hypertension, history of temporal arteritis, atrial fibrillation, GERD, chronic back pain with multiple prior surgeries, osteoarthritis involving multiple joints    Examination-Activity Limitations Bed Mobility;Bend;Locomotion Level;Transfers;Dressing;Stand;Squat    Examination-Participation Restrictions Community Activity;Cleaning;Shop;Laundry    Stability/Clinical Decision Making Evolving/Moderate complexity    Rehab Potential Good    PT Frequency 3x / week   2x/wk on land; 1x/wk aquatics   PT Duration 8 weeks    PT Treatment/Interventions Aquatic  Therapy;ADLs/Self Care Home Management;Cryotherapy;Electrical Stimulation;Biofeedback;Moist Heat;DME Instruction;Gait training;Stair training;Functional mobility training;Therapeutic activities;Therapeutic exercise;Balance training;Neuromuscular re-education;Manual techniques;Patient/family education;Orthotic Fit/Training;Dry needling;Taping;Vasopneumatic Device    PT Next Visit Plan Land: Continue to work on glute/hip  extensor and quad strengthening. Manual therapy & strengthening as needed for L ankle DF. Work on endurance in upright posture and gait.    PT Home Exercise Plan Access Code: [email or web address]    Consulted and Agree with Plan of Care Patient;Family member/caregiver    Family Member Consulted caregiver           Patient will benefit from skilled therapeutic intervention in order to improve the following deficits and impairments:  Abnormal gait,Decreased range of motion,Difficulty walking,Decreased endurance,Decreased activity tolerance,Pain,Decreased balance,Hypomobility,Improper body mechanics,Decreased mobility,Decreased strength,Postural dysfunction  Visit Diagnosis: Muscle weakness (generalized)  Abnormal posture  Pain in right hip  Difficulty in walking, not elsewhere classified     Problem List Patient Active Problem List   Diagnosis Date Noted  . Cough 05/24/2020  . Dyspnea 05/24/2020  . Optic neuritis 05/24/2020  . Osteopenia 05/24/2020  . Rash 05/24/2020  . Vitamin D deficiency 05/24/2020  . Palliative care by specialist   . Goals of care, counseling/discussion   . DNR (do not resuscitate)   . Weakness   . Advanced directives, counseling/discussion   . Pressure injury of skin 10/18/2019  . Closed fracture of right distal femur ([place]) 10/17/2019  . Femur fracture, right ([place]) 09/25/2019  . Chronic diastolic CHF (congestive heart failure) ([place]) 09/25/2019  . HCAP (healthcare-associated pneumonia) 06/19/2019  . Encephalopathy 06/19/2019  . Lumbar degenerative disc disease   . Paroxysmal A-fib ([place])   . Hypomagnesemia   . C. difficile colitis   . Diarrhea 05/29/2019  . CHF (congestive heart failure) ([place]) 05/29/2019  . Recurrent falls 04/30/2019  . Pericardial effusion without cardiac tamponade 03/29/2019  . Hypoalbuminemia 03/27/2019  . Cardiac tamponade   . Pericarditis 03/06/2019  . Fever   . Septic shock ([place])   . Atypical chest pain   . Atrial fibrillation with  rapid ventricular response ([place])   . Nausea 01/17/2019  . Acute diverticulitis 01/13/2019  . Chronic back pain 01/13/2019  . Tachycardia 01/13/2019  . Hypokalemia 01/13/2019  . Chronic low back pain 07/10/2018  . Influenza A 05/20/2017  . Severe sepsis ([place]) 05/20/2017  . Acute respiratory failure with hypoxia ([place]) 05/20/2017  . CKD (chronic kidney disease), stage III ([place]) 05/20/2017  . PAF (paroxysmal atrial fibrillation) ([place]) 05/20/2017  . HTN (hypertension) 05/20/2017  . Giant cell arteritis ([place]) 05/20/2017  . Coronary disease 05/20/2017  . Abnormal urinalysis 05/20/2017  . Osteoporosis 05/08/2016  . Herniated nucleus pulposus, L5-S1, right 11/04/2015  . Major depressive disorder, recurrent episode, moderate ([place]) 07/16/2013  . Abdominal pain, epigastric 06/22/2013  . Lumbar stenosis 04/25/2013  . Chronic insomnia 02/07/2013  . Elevated transaminase level 02/07/2013  . CVA (cerebral infarction) 10/29/2012  . Visual changes 10/28/2012  . Depression 10/28/2012  . Anxiety state 10/28/2012  . Anemia, unspecified 10/28/2012  . Nonspecific (abnormal) findings on radiological and other examination of gastrointestinal tract 01/25/2012  . Hyponatremia 01/24/2012  . Hematuria 01/24/2012  . Enteritis 12/24/2011  . Abdominal pain, other specified site 12/24/2011  . Leg pain, right 02/18/2011  . Rheumatoid arthritis ([place]) 02/04/2011  . Temporal arteritis ([place]) 09/01/2010  . Hypertension 09/01/2010  . Hyperlipidemia 09/01/2010  . History of atrial fibrillation 09/01/2010  . CAD (coronary artery disease) 09/01/2010  . GERD (gastroesophageal reflux disease) 09/01/2010  . Insomnia 09/01/2010   Anderson Malta  Christine, PTA 06/14/20 5:13 PM  [place], [place], [address] Phone: [phone]   Fax:  [phone]  Name: Betty Jackson MRN: [id number] Date of Birth: 1935/03/18

## 2020-06-14 NOTE — Telephone Encounter (Signed)
I left Betty Jackson a voicemail letting her know that a new order for a transport chair has been placed & to let me know if it needs to be faxed.

## 2020-06-17 ENCOUNTER — Ambulatory Visit (HOSPITAL_BASED_OUTPATIENT_CLINIC_OR_DEPARTMENT_OTHER): Payer: Medicare Other | Admitting: Physical Therapy

## 2020-06-17 ENCOUNTER — Other Ambulatory Visit: Payer: Self-pay

## 2020-06-17 DIAGNOSIS — R262 Difficulty in walking, not elsewhere classified: Secondary | ICD-10-CM | POA: Diagnosis not present

## 2020-06-17 DIAGNOSIS — M25551 Pain in right hip: Secondary | ICD-10-CM | POA: Diagnosis not present

## 2020-06-17 DIAGNOSIS — R2689 Other abnormalities of gait and mobility: Secondary | ICD-10-CM

## 2020-06-17 DIAGNOSIS — M6281 Muscle weakness (generalized): Secondary | ICD-10-CM | POA: Diagnosis not present

## 2020-06-17 DIAGNOSIS — R293 Abnormal posture: Secondary | ICD-10-CM | POA: Diagnosis not present

## 2020-06-17 NOTE — Therapy (Signed)
[place], [place], [address] Phone: [phone]   Fax:  [phone]  Physical Therapy Treatment  Patient Details  Name: Betty Jackson MRN: [id number] Date of Birth: 1935/02/26 Referring Provider (PT): Altamese Loyola, MD   Encounter Date: 06/17/2020   PT End of Session - 06/17/20 1153    Visit Number 15    Number of Visits 25    Date for PT Re-Evaluation 07/08/20    Authorization Type [employer] Medicare    Progress Note Due on Visit 20    PT Start Time 1148    PT Stop Time 1230    PT Time Calculation (min) 42 min    Activity Tolerance Patient tolerated treatment well    Behavior During Therapy [facility] for tasks assessed/performed           Past Medical History:  Diagnosis Date  . Acute respiratory failure with hypoxia ([place]) 05/20/2017  . Anemia, unspecified 10/28/2012  . Anxiety state, unspecified 10/28/2012  . CAD (coronary artery disease)    a. Stent RCA in [facility];  b. Cath approx 2009 - nonobs per pt report.  . Cataract    immature on the left eye  . Chronic insomnia 02/07/2013  . Chronic lower back pain    scoliosis  . CKD (chronic kidney disease) stage 3, GFR 30-59 ml/min (HCC)   . CVA (cerebral infarction) 10/29/2012  . DDD (degenerative disc disease)   . Depression   . Diverticulosis   . GERD (gastroesophageal reflux disease) 09/01/2010  . Hemorrhoids   . Herniated nucleus pulposus, L5-S1, right 11/04/2015  . Hyperlipidemia    takes Lipitor daily  . Hypertension    takes Amlodipine,Losartan,Metoprolol,and HCTZ daily  . Incontinence of urine   . Insomnia    takes Restoril nightly  . Lumbar stenosis 04/25/2013  . Major depressive disorder, recurrent episode, moderate ([place]) 07/16/2013  . Osteoporosis   . PAF (paroxysmal atrial fibrillation) ([place]) 2011   a. lone epidode in 2011 according to notes.  . Rheumatoid arthritis ([place])   . Scoliosis   . Sepsis ([place]) 05/2019  . Temporal arteritis  ([place]) 2011   a. followed @ [facility]; potential flareup 10/28/2012/notes 10/28/2012  . Vocal cord dysfunction    "they don't operate properly" (10/28/2012)    Past Surgical History:  Procedure Laterality Date  . ABDOMINAL HYSTERECTOMY  ~ 1984   vaginally  . BACK SURGERY  7-27yrs ago   X Stop  . BLADDER SUSPENSION  2001  . BREAST BIOPSY Right   . CATARACT EXTRACTION W/ INTRAOCULAR LENS IMPLANT Right ~ 08/2012  . CHEST TUBE INSERTION Left 03/08/2019   Procedure: Chest Tube Insertion;  Surgeon: Ivin Poot, MD;  Location: [place];  Service: Thoracic;  Laterality: Left;  . COLONOSCOPY  01/26/2012   Procedure: COLONOSCOPY;  Surgeon: Ladene Artist, MD,FACG;  Location: [facility] ENDOSCOPY;  Service: Endoscopy;  Laterality: N/A;  note the EGD is possible  . CORONARY ANGIOPLASTY WITH STENT PLACEMENT  2006   X 1 stent  . EPIDURAL BLOCK INJECTION    . ESOPHAGOGASTRODUODENOSCOPY  01/26/2012   Procedure: ESOPHAGOGASTRODUODENOSCOPY (EGD);  Surgeon: Ladene Artist, MD,FACG;  Location: [facility] ENDOSCOPY;  Service: Endoscopy;  Laterality: N/A;  . FINE NEEDLE ASPIRATION Right 09/26/2019   Procedure: FINE NEEDLE ASPIRATION;  Surgeon: Altamese Greenwood, MD;  Location: [place];  Service: Orthopedics;  Laterality: Right;  . HEMIARTHROPLASTY HIP Right 2012  . IR EPIDUROGRAPHY  04/20/2019  . LAPAROSCOPIC CHOLECYSTECTOMY  2001  . LUMBAR FUSION  03/2013  . LUMBAR LAMINECTOMY/DECOMPRESSION MICRODISCECTOMY Right 11/04/2015   Procedure: Right Lumbar Five-Sacral One Microdiskectomy;  Surgeon: Kristeen Miss, MD;  Location: [place] NEURO ORS;  Service: Neurosurgery;  Laterality: Right;  Right L5-S1 Microdiskectomy  . moles removed that required stiches     one on leg and one on face  . ORIF FEMUR FRACTURE Right 09/26/2019   Procedure: OPEN REDUCTION INTERNAL FIXATION (ORIF) DISTAL FEMUR FRACTURE;  Surgeon: Altamese Gallitzin, MD;  Location: [place];  Service: Orthopedics;  Laterality: Right;  . ORIF FEMUR FRACTURE Right 10/17/2019   Procedure: OPEN  REDUCTION INTERNAL FIXATION (ORIF) DISTAL FEMUR FRACTURE;  Surgeon: Altamese , MD;  Location: [place];  Service: Orthopedics;  Laterality: Right;  . SUBXYPHOID PERICARDIAL WINDOW N/A 03/08/2019   Procedure: SUBXYPHOID PERICARDIAL WINDOW;  Surgeon: Ivin Poot, MD;  Location: [place];  Service: Thoracic;  Laterality: N/A;  . TEE WITHOUT CARDIOVERSION N/A 03/08/2019   Procedure: TRANSESOPHAGEAL ECHOCARDIOGRAM (TEE);  Surgeon: Prescott Gum, Collier Salina, MD;  Location: [place];  Service: Thoracic;  Laterality: N/A;  . TEMPORAL ARTERY BIOPSY / LIGATION Bilateral 2011  . TONSILLECTOMY AND ADENOIDECTOMY     at age 19  . TUBAL LIGATION  ~ 1982  . X-STOP IMPLANTATION  ~ 2010   "lower back" (10/28/2012)    There were no vitals filed for this visit.   Subjective Assessment - 06/17/20 1154    Subjective Pt states the back of her R LE hurt a lot after Wednesday's session. She reports it is still feeling a little sore today.    Patient is accompained by: --   caregiver   Pertinent History CAD, hypertension, history of temporal arteritis, atrial fibrillation, GERD, chronic back pain with multiple prior surgeries, osteoarthritis involving multiple joints    Limitations Standing    How long can you sit comfortably? N/a    How long can you stand comfortably? Not long    How long can you walk comfortably? 16' with RW (PLOF)    Patient Stated Goals Improve mobility and strength    Currently in Pain? Yes    Pain Score 2     Pain Location Leg    Pain Orientation Right;Posterior    Pain Descriptors / Indicators Sore;Tightness    Pain Type Chronic pain    Pain Onset More than a month ago                             [facility] Adult PT Treatment/Exercise - 06/17/20 0001      Ambulation/Gait   Ambulation Distance (Feet) 200 Feet    Assistive device Rolling walker    Gait Pattern Step-through pattern;Decreased weight shift to right;Decreased step length - right;Decreased step length - left;Right  flexed knee in stance;Left flexed knee in stance;Trunk flexed    Gait Comments Cues to improve quad activation and improve knee extension during stance phase; cues to keep trunk extended      Knee/Hip Exercises: Stretches   Passive Hamstring Stretch Left;2 reps;30 seconds    Gastroc Stretch Right;Left;20 seconds    Soleus Stretch Right;Left;1 rep;20 seconds    Other Knee/Hip Stretches LTR x30 sec bilat    Other Knee/Hip Stretches Single knee to chest x30 sec bilat; double knee to chest x30 sec      Knee/Hip Exercises: Aerobic   Nustep L4 x 5 min      Knee/Hip Exercises: Standing   Other Standing Knee  Exercises Backwards walking x20'      Knee/Hip Exercises: Seated   Other Seated Knee/Hip Exercises SLR 2x10 bilat      Manual Therapy   Joint Mobilization grade II to III Talocrural distraction and PA mobs for ankle dorsiflexion                    PT Short Term Goals - 06/10/20 1157      PT SHORT TERM GOAL #1   Title Pt will be independent with HEP    Time 4    Period Weeks    Status Achieved    Target Date 06/10/20      PT SHORT TERM GOAL #2   Title Pt will be able to perform 5x STS in <15 sec to demo improved functional strength    Baseline 19 sec; 15 sec 06/10/20    Time 4    Period Weeks    Status Achieved    Target Date 06/10/20      PT SHORT TERM GOAL #3   Title Pt will be able to decrease TUG score by at least 10 sec to demo MCID    Baseline 40 sec; 28 sec on 06/10/20    Time 4    Period Weeks    Status Achieved    Target Date 06/10/20      PT SHORT TERM GOAL #4   Title Pt will be fully independent with transfers    Baseline Requires min A at times    Time 4    Period Weeks    Status Achieved    Target Date 06/10/20             PT Long Term Goals - 05/13/20 1411      PT LONG TERM GOAL #1   Title Pt will be independent with maintaining mobility at home    Time 8    Period Weeks    Status New    Target Date 07/08/20      PT LONG TERM  GOAL #2   Title Pt will have improved 5 x STS to </=13 sec to demo decreased fall risk    Baseline 19 sec    Time 8    Period Weeks    Status New    Target Date 07/08/20      PT LONG TERM GOAL #3   Title Pt will have improved TUG score to </=20 sec    Baseline 40 sec    Time 8    Period Weeks    Status New    Target Date 07/08/20      PT LONG TERM GOAL #4   Title Pt will have improved Berg Score to at least 37/56 to demo [place] for decreased fall risk    Baseline 29/56    Time 8    Period Weeks    Status New    Target Date 07/08/20                 Plan - 06/17/20 1245    Clinical Impression Statement Pt remains sore from prone exercises last week. Treatment thus focused on gentle stretching hamstring, glutes, and calves. Continued manual therapy to improve L ankle mobility -- pt with increased L ankle DF after joint mobilization but L ant tib remains weaker than R ant tib. Increased time spent with ambulation and standing. Pt with difficulty maintaining quad control/knee extension with hip movement (L worse than R). Encouraged pt to continue to  work on increasing time in standing.    Personal Factors and Comorbidities Age;Fitness;Comorbidity 1;Comorbidity 2;Comorbidity 3+;Time since onset of injury/illness/exacerbation    Comorbidities CAD, hypertension, history of temporal arteritis, atrial fibrillation, GERD, chronic back pain with multiple prior surgeries, osteoarthritis involving multiple joints    Examination-Activity Limitations Bed Mobility;Bend;Locomotion Level;Transfers;Dressing;Stand;Squat    Examination-Participation Restrictions Community Activity;Cleaning;Shop;Laundry    Stability/Clinical Decision Making Evolving/Moderate complexity    Rehab Potential Good    PT Frequency 3x / week   2x/wk on land; 1x/wk aquatics   PT Duration 8 weeks    PT Treatment/Interventions Aquatic Therapy;ADLs/Self Care Home Management;Cryotherapy;Electrical Stimulation;Biofeedback;Moist  Heat;DME Instruction;Gait training;Stair training;Functional mobility training;Therapeutic activities;Therapeutic exercise;Balance training;Neuromuscular re-education;Manual techniques;Patient/family education;Orthotic Fit/Training;Dry needling;Taping;Vasopneumatic Device    PT Next Visit Plan Land: Continue to work on glute/hip extensor and quad strengthening. Manual therapy & strengthening as needed for L ankle DF. Work on endurance in upright posture and gait.    PT Home Exercise Plan Access Code: [email or web address]; pt to work on standing for a total of at least 10 minutes in her day (can be split 2x5 min sessions or 5x2 min sessions)    Consulted and Agree with Plan of Care Patient;Family member/caregiver    Family Member Consulted caregiver           Patient will benefit from skilled therapeutic intervention in order to improve the following deficits and impairments:  Abnormal gait,Decreased range of motion,Difficulty walking,Decreased endurance,Decreased activity tolerance,Pain,Decreased balance,Hypomobility,Improper body mechanics,Decreased mobility,Decreased strength,Postural dysfunction  Visit Diagnosis: Muscle weakness (generalized)  Abnormal posture  Pain in right hip  Difficulty in walking, not elsewhere classified  Other abnormalities of gait and mobility     Problem List Patient Active Problem List   Diagnosis Date Noted  . Cough 05/24/2020  . Dyspnea 05/24/2020  . Optic neuritis 05/24/2020  . Osteopenia 05/24/2020  . Rash 05/24/2020  . Vitamin D deficiency 05/24/2020  . Palliative care by specialist   . Goals of care, counseling/discussion   . DNR (do not resuscitate)   . Weakness   . Advanced directives, counseling/discussion   . Pressure injury of skin 10/18/2019  . Closed fracture of right distal femur ([place]) 10/17/2019  . Femur fracture, right ([employer]) 09/25/2019  . Chronic diastolic CHF (congestive heart failure) ([place]) 09/25/2019  . HCAP (healthcare-associated  pneumonia) 06/19/2019  . Encephalopathy 06/19/2019  . Lumbar degenerative disc disease   . Paroxysmal A-fib ([place])   . Hypomagnesemia   . C. difficile colitis   . Diarrhea 05/29/2019  . CHF (congestive heart failure) ([place]) 05/29/2019  . Recurrent falls 04/30/2019  . Pericardial effusion without cardiac tamponade 03/29/2019  . Hypoalbuminemia 03/27/2019  . Cardiac tamponade   . Pericarditis 03/06/2019  . Fever   . Septic shock ([place])   . Atypical chest pain   . Atrial fibrillation with rapid ventricular response ([place])   . Nausea 01/17/2019  . Acute diverticulitis 01/13/2019  . Chronic back pain 01/13/2019  . Tachycardia 01/13/2019  . Hypokalemia 01/13/2019  . Chronic low back pain 07/10/2018  . Influenza A 05/20/2017  . Severe sepsis ([place]) 05/20/2017  . Acute respiratory failure with hypoxia ([place]) 05/20/2017  . CKD (chronic kidney disease), stage III ([place]) 05/20/2017  . PAF (paroxysmal atrial fibrillation) ([place]) 05/20/2017  . HTN (hypertension) 05/20/2017  . Giant cell arteritis ([place]) 05/20/2017  . Coronary disease 05/20/2017  . Abnormal urinalysis 05/20/2017  . Osteoporosis 05/08/2016  . Herniated nucleus pulposus, L5-S1, right 11/04/2015  . Major depressive disorder, recurrent episode, moderate ([place]) 07/16/2013  .  Abdominal pain, epigastric 06/22/2013  . Lumbar stenosis 04/25/2013  . Chronic insomnia 02/07/2013  . Elevated transaminase level 02/07/2013  . CVA (cerebral infarction) 10/29/2012  . Visual changes 10/28/2012  . Depression 10/28/2012  . Anxiety state 10/28/2012  . Anemia, unspecified 10/28/2012  . Nonspecific (abnormal) findings on radiological and other examination of gastrointestinal tract 01/25/2012  . Hyponatremia 01/24/2012  . Hematuria 01/24/2012  . Enteritis 12/24/2011  . Abdominal pain, other specified site 12/24/2011  . Leg pain, right 02/18/2011  . Rheumatoid arthritis ([place]) 02/04/2011  . Temporal arteritis ([place]) 09/01/2010  . Hypertension  09/01/2010  . Hyperlipidemia 09/01/2010  . History of atrial fibrillation 09/01/2010  . CAD (coronary artery disease) 09/01/2010  . GERD (gastroesophageal reflux disease) 09/01/2010  . Insomnia 09/01/2010    Gellen April Ma L Nonato PT, DPT 06/17/2020, 12:50 PM  [facility] [place], [place], [address] Phone: [phone]   Fax:  [phone]  Name: Betty Jackson MRN: [id number] Date of Birth: September 09, 1934

## 2020-06-19 ENCOUNTER — Ambulatory Visit (HOSPITAL_BASED_OUTPATIENT_CLINIC_OR_DEPARTMENT_OTHER): Payer: Medicare Other | Admitting: Physical Therapy

## 2020-06-20 ENCOUNTER — Other Ambulatory Visit: Payer: Self-pay

## 2020-06-21 ENCOUNTER — Ambulatory Visit (HOSPITAL_BASED_OUTPATIENT_CLINIC_OR_DEPARTMENT_OTHER): Payer: Medicare Other | Admitting: Physical Therapy

## 2020-06-21 ENCOUNTER — Encounter (HOSPITAL_BASED_OUTPATIENT_CLINIC_OR_DEPARTMENT_OTHER): Payer: Self-pay | Admitting: Physical Therapy

## 2020-06-21 ENCOUNTER — Encounter: Payer: Self-pay | Admitting: Family Medicine

## 2020-06-21 ENCOUNTER — Ambulatory Visit (INDEPENDENT_AMBULATORY_CARE_PROVIDER_SITE_OTHER): Payer: Medicare Other | Admitting: Family Medicine

## 2020-06-21 VITALS — BP 136/80 | HR 81 | Ht 64.0 in

## 2020-06-21 DIAGNOSIS — M25551 Pain in right hip: Secondary | ICD-10-CM

## 2020-06-21 DIAGNOSIS — G8929 Other chronic pain: Secondary | ICD-10-CM

## 2020-06-21 DIAGNOSIS — I10 Essential (primary) hypertension: Secondary | ICD-10-CM

## 2020-06-21 DIAGNOSIS — M6281 Muscle weakness (generalized): Secondary | ICD-10-CM | POA: Diagnosis not present

## 2020-06-21 DIAGNOSIS — M545 Low back pain, unspecified: Secondary | ICD-10-CM | POA: Diagnosis not present

## 2020-06-21 DIAGNOSIS — M810 Age-related osteoporosis without current pathological fracture: Secondary | ICD-10-CM

## 2020-06-21 DIAGNOSIS — R262 Difficulty in walking, not elsewhere classified: Secondary | ICD-10-CM | POA: Diagnosis not present

## 2020-06-21 DIAGNOSIS — R293 Abnormal posture: Secondary | ICD-10-CM

## 2020-06-21 DIAGNOSIS — R2689 Other abnormalities of gait and mobility: Secondary | ICD-10-CM | POA: Diagnosis not present

## 2020-06-21 LAB — BASIC METABOLIC PANEL
BUN: 21 mg/dL (ref 6–23)
CO2: 32 mEq/L (ref 19–32)
Calcium: 9.2 mg/dL (ref 8.4–10.5)
Chloride: 98 mEq/L (ref 96–112)
Creatinine, Ser: 0.9 mg/dL (ref 0.40–1.20)
GFR: 58.93 mL/min — ABNORMAL LOW (ref >60.00)
Glucose, Bld: 168 mg/dL — ABNORMAL HIGH (ref 70–99)
Potassium: 3.2 mEq/L — ABNORMAL LOW (ref 3.5–5.1)
Sodium: 141 mEq/L (ref 135–145)

## 2020-06-21 NOTE — Therapy (Signed)
Betty [place], [place], [address] Phone: [phone]   Fax:  [phone]  Physical Therapy Treatment  Patient Details  Name: Betty Jackson MRN: [id number] Date of Birth: 28-Nov-1934 Referring Provider (PT): Altamese [place], MD   Encounter Date: 06/21/2020   PT End of Session - 06/21/20 1608    Visit Number 16    Number of Visits 25    Date for PT Re-Evaluation 07/08/20    Authorization Type [employer] Medicare    PT Start Time 1453   pt arrived late; transportation issue   PT Stop Time 1531    PT Time Calculation (min) 38 min    Activity Tolerance Patient tolerated treatment well    Behavior During Therapy [facility] for tasks assessed/performed           Past Medical History:  Diagnosis Date  . Acute respiratory failure with hypoxia ([place]) 05/20/2017  . Anemia, unspecified 10/28/2012  . Anxiety state, unspecified 10/28/2012  . CAD (coronary artery disease)    a. Stent RCA in [facility];  b. Cath approx 2009 - nonobs per pt report.  . Cataract    immature on the left eye  . Chronic insomnia 02/07/2013  . Chronic lower back pain    scoliosis  . CKD (chronic kidney disease) stage 3, GFR 30-59 ml/min (HCC)   . CVA (cerebral infarction) 10/29/2012  . DDD (degenerative disc disease)   . Depression   . Diverticulosis   . GERD (gastroesophageal reflux disease) 09/01/2010  . Hemorrhoids   . Herniated nucleus pulposus, L5-S1, right 11/04/2015  . Hyperlipidemia    takes Lipitor daily  . Hypertension    takes Amlodipine,Losartan,Metoprolol,and HCTZ daily  . Incontinence of urine   . Insomnia    takes Restoril nightly  . Lumbar stenosis 04/25/2013  . Major depressive disorder, recurrent episode, moderate ([place]) 07/16/2013  . Osteoporosis   . PAF (paroxysmal atrial fibrillation) ([place]) 2011   a. lone epidode in 2011 according to notes.  . Rheumatoid arthritis ([place])   . Scoliosis   . Sepsis ([place]) 05/2019  . Temporal  arteritis ([place]) 2011   a. followed @ [facility]; potential flareup 10/28/2012/notes 10/28/2012  . Vocal cord dysfunction    "they don't operate properly" (10/28/2012)    Past Surgical History:  Procedure Laterality Date  . ABDOMINAL HYSTERECTOMY  ~ 1984   vaginally  . BACK SURGERY  7-29yrs ago   X Stop  . BLADDER SUSPENSION  2001  . BREAST BIOPSY Right   . CATARACT EXTRACTION W/ INTRAOCULAR LENS IMPLANT Right ~ 08/2012  . CHEST TUBE INSERTION Left 03/08/2019   Procedure: Chest Tube Insertion;  Surgeon: Ivin Poot, MD;  Location: [place];  Service: Thoracic;  Laterality: Left;  . COLONOSCOPY  01/26/2012   Procedure: COLONOSCOPY;  Surgeon: Ladene Artist, MD,FACG;  Location: [facility] ENDOSCOPY;  Service: Endoscopy;  Laterality: N/A;  note the EGD is possible  . CORONARY ANGIOPLASTY WITH STENT PLACEMENT  2006   X 1 stent  . EPIDURAL BLOCK INJECTION    . ESOPHAGOGASTRODUODENOSCOPY  01/26/2012   Procedure: ESOPHAGOGASTRODUODENOSCOPY (EGD);  Surgeon: Ladene Artist, MD,FACG;  Location: [facility] ENDOSCOPY;  Service: Endoscopy;  Laterality: N/A;  . FINE NEEDLE ASPIRATION Right 09/26/2019   Procedure: FINE NEEDLE ASPIRATION;  Surgeon: Altamese [place], MD;  Location: [place];  Service: Orthopedics;  Laterality: Right;  . HEMIARTHROPLASTY HIP Right 2012  . IR EPIDUROGRAPHY  04/20/2019  . LAPAROSCOPIC CHOLECYSTECTOMY  2001  .  LUMBAR FUSION  03/2013  . LUMBAR LAMINECTOMY/DECOMPRESSION MICRODISCECTOMY Right 11/04/2015   Procedure: Right Lumbar Five-Sacral One Microdiskectomy;  Surgeon: Kristeen Miss, MD;  Location: [place] NEURO ORS;  Service: Neurosurgery;  Laterality: Right;  Right L5-S1 Microdiskectomy  . moles removed that required stiches     one on leg and one on face  . ORIF FEMUR FRACTURE Right 09/26/2019   Procedure: OPEN REDUCTION INTERNAL FIXATION (ORIF) DISTAL FEMUR FRACTURE;  Surgeon: Altamese [place], MD;  Location: [place];  Service: Orthopedics;  Laterality: Right;  . ORIF FEMUR FRACTURE Right 10/17/2019   Procedure:  OPEN REDUCTION INTERNAL FIXATION (ORIF) DISTAL FEMUR FRACTURE;  Surgeon: Altamese [place], MD;  Location: [place];  Service: Orthopedics;  Laterality: Right;  . SUBXYPHOID PERICARDIAL WINDOW N/A 03/08/2019   Procedure: SUBXYPHOID PERICARDIAL WINDOW;  Surgeon: Ivin Poot, MD;  Location: [place];  Service: Thoracic;  Laterality: N/A;  . TEE WITHOUT CARDIOVERSION N/A 03/08/2019   Procedure: TRANSESOPHAGEAL ECHOCARDIOGRAM (TEE);  Surgeon: [place] Gum, Collier [place], MD;  Location: [facility];  Service: Thoracic;  Laterality: N/A;  . TEMPORAL ARTERY BIOPSY / LIGATION Bilateral 2011  . TONSILLECTOMY AND ADENOIDECTOMY     at age 35  . TUBAL LIGATION  ~ 1982  . X-STOP IMPLANTATION  ~ 2010   "lower back" (10/28/2012)    There were no vitals filed for this visit.   Subjective Assessment - 06/21/20 1603    Subjective I was so sore (in Rt leg) I skipped my Wed appt.  Pt reports she is walking more around the house with RW and can tell that she is improving.    Patient is accompained by: Family member   daughter   Pertinent History CAD, hypertension, history of temporal arteritis, atrial fibrillation, GERD, chronic back pain with multiple prior surgeries, osteoarthritis involving multiple joints    Currently in Pain? Yes    Pain Score 4     Pain Location Buttocks    Pain Orientation Right;Posterior    Pain Descriptors / Indicators Sore    Aggravating Factors  standing, walking    Pain Relieving Factors sitting, pain meds           Pt seen for aquatic therapy today.  Treatment took place in water 3.25-4 ft in depth at the [facility] pool. Temp of water was 91.  Pt entered/exited the pool via stairs with bilat rail and CGA for safety.  Treatment:   Forward /backward gait with HHA, cues for increased step length of LLE. Holding onto wall: sidestepping Rt/Lt Hip ext x 5 reps x 2 sets Hip abdct x 5 reps x 2 sets Marching high knee. Heel raises / mini squats x 10 Forward gait holding onto aqua jogger  dumbell with cues for upright posture and even step length. Sit to/from stand at elevated bench in pool x 10, no UE support.  Seated LAQ with cues for DF for dynamic stretch to posterior legs.  Single leg long sitting hamstring stretch x2 reps RLE, 1 rep LLE.  Then modified table pigeon pose on bench for piriformis stretch x 2 reps RLE, 1 rep LLE.   Pt requires buoyancy for support and to offload joints with strengthening exercises. Viscosity of the water is needed for resistance of strengthening; water current perturbations provides challenge to standing balance unsupported, requiring increased core activation.     PT Short Term Goals - 06/10/20 1157      PT SHORT TERM GOAL #1   Title Pt will be independent with HEP  Time 4    Period Weeks    Status Achieved    Target Date 06/10/20      PT SHORT TERM GOAL #2   Title Pt will be able to perform 5x STS in <15 sec to demo improved functional strength    Baseline 19 sec; 15 sec 06/10/20    Time 4    Period Weeks    Status Achieved    Target Date 06/10/20      PT SHORT TERM GOAL #3   Title Pt will be able to decrease TUG score by at least 10 sec to demo MCID    Baseline 40 sec; 28 sec on 06/10/20    Time 4    Period Weeks    Status Achieved    Target Date 06/10/20      PT SHORT TERM GOAL #4   Title Pt will be fully independent with transfers    Baseline Requires min A at times    Time 4    Period Weeks    Status Achieved    Target Date 06/10/20             PT Long Term Goals - 05/13/20 1411      PT LONG TERM GOAL #1   Title Pt will be independent with maintaining mobility at home    Time 8    Period Weeks    Status New    Target Date 07/08/20      PT LONG TERM GOAL #2   Title Pt will have improved 5 x STS to </=13 sec to demo decreased fall risk    Baseline 19 sec    Time 8    Period Weeks    Status New    Target Date 07/08/20      PT LONG TERM GOAL #3   Title Pt will have improved TUG score to </=20 sec     Baseline 40 sec    Time 8    Period Weeks    Status New    Target Date 07/08/20      PT LONG TERM GOAL #4   Title Pt will have improved Berg Score to at least 37/56 to demo [place] for decreased fall risk    Baseline 29/56    Time 8    Period Weeks    Status New    Target Date 07/08/20                 Plan - 06/21/20 1605    Clinical Impression Statement Gait observed in pool - pt demonstrates step-to pattern with increased step length with RLE.  Multiple trials with HHA/holding floatation device to encourage even step length and continued upright posture. Pt requiring less CGA during these trials and showing more short bursts of upright posture.  Noted Rt hamstring and hip rotators are tight when attempting to stretch them in the bench in the water; may benefit from land stretches for these muscles.  Pt tolerated exercises well, without increase in pain.  Making good progress towards goals.    Personal Factors and Comorbidities Age;Fitness;Comorbidity 1;Comorbidity 2;Comorbidity 3+;Time since onset of injury/illness/exacerbation    Comorbidities CAD, hypertension, history of temporal arteritis, atrial fibrillation, GERD, chronic back pain with multiple prior surgeries, osteoarthritis involving multiple joints    Examination-Activity Limitations Bed Mobility;Bend;Locomotion Level;Transfers;Dressing;Stand;Squat    Examination-Participation Restrictions Community Activity;Cleaning;Shop;Laundry    Stability/Clinical Decision Making Evolving/Moderate complexity    Rehab Potential Good    PT Frequency 3x /  week   2x/wk on land; 1x/wk aquatics   PT Duration 8 weeks    PT Treatment/Interventions Aquatic Therapy;ADLs/Self Care Home Management;Cryotherapy;Electrical Stimulation;Biofeedback;Moist Heat;DME Instruction;Gait training;Stair training;Functional mobility training;Therapeutic activities;Therapeutic exercise;Balance training;Neuromuscular re-education;Manual techniques;Patient/family  education;Orthotic Fit/Training;Dry needling;Taping;Vasopneumatic Device    PT Next Visit Plan Land: Continue to work on glute/hip extensor and quad strengthening. Manual therapy & strengthening as needed for L ankle DF. Work on endurance in upright posture and gait.    PT Home Exercise Plan Access Code: [email or web address]; pt to work on standing for a total of at least 10 minutes in her day (can be split 2x5 min sessions or 5x2 min sessions)    Consulted and Agree with Plan of Care Patient;Family member/caregiver    Family Member Consulted caregiver           Patient will benefit from skilled therapeutic intervention in order to improve the following deficits and impairments:  Abnormal gait,Decreased range of motion,Difficulty walking,Decreased endurance,Decreased activity tolerance,Pain,Decreased balance,Hypomobility,Improper body mechanics,Decreased mobility,Decreased strength,Postural dysfunction  Visit Diagnosis: Muscle weakness (generalized)  Abnormal posture  Pain in right hip  Difficulty in walking, not elsewhere classified     Problem List Patient Active Problem List   Diagnosis Date Noted  . Cough 05/24/2020  . Dyspnea 05/24/2020  . Optic neuritis 05/24/2020  . Osteopenia 05/24/2020  . Rash 05/24/2020  . Vitamin D deficiency 05/24/2020  . Palliative care by specialist   . Goals of care, counseling/discussion   . DNR (do not resuscitate)   . Weakness   . Advanced directives, counseling/discussion   . Pressure injury of skin 10/18/2019  . Closed fracture of right distal femur ([place]) 10/17/2019  . Femur fracture, right ([place]) 09/25/2019  . Chronic diastolic CHF (congestive heart failure) ([place]) 09/25/2019  . HCAP (healthcare-associated pneumonia) 06/19/2019  . Encephalopathy 06/19/2019  . Lumbar degenerative disc disease   . Paroxysmal A-fib ([place])   . Hypomagnesemia   . C. difficile colitis   . Diarrhea 05/29/2019  . CHF (congestive heart failure) ([place]) 05/29/2019  .  Recurrent falls 04/30/2019  . Pericardial effusion without cardiac tamponade 03/29/2019  . Hypoalbuminemia 03/27/2019  . Cardiac tamponade   . Pericarditis 03/06/2019  . Fever   . Septic shock ([place])   . Atypical chest pain   . Atrial fibrillation with rapid ventricular response ([place])   . Nausea 01/17/2019  . Acute diverticulitis 01/13/2019  . Chronic back pain 01/13/2019  . Tachycardia 01/13/2019  . Hypokalemia 01/13/2019  . Chronic low back pain 07/10/2018  . Influenza A 05/20/2017  . Severe sepsis ([place]) 05/20/2017  . Acute respiratory failure with hypoxia ([place]) 05/20/2017  . CKD (chronic kidney disease), stage III ([place]) 05/20/2017  . PAF (paroxysmal atrial fibrillation) ([place]) 05/20/2017  . HTN (hypertension) 05/20/2017  . Giant cell arteritis ([place]) 05/20/2017  . Coronary disease 05/20/2017  . Abnormal urinalysis 05/20/2017  . Osteoporosis 05/08/2016  . Herniated nucleus pulposus, L5-S1, right 11/04/2015  . Major depressive disorder, recurrent episode, moderate ([place]) 07/16/2013  . Abdominal pain, epigastric 06/22/2013  . Lumbar stenosis 04/25/2013  . Chronic insomnia 02/07/2013  . Elevated transaminase level 02/07/2013  . CVA (cerebral infarction) 10/29/2012  . Visual changes 10/28/2012  . Depression 10/28/2012  . Anxiety state 10/28/2012  . Anemia, unspecified 10/28/2012  . Nonspecific (abnormal) findings on radiological and other examination of gastrointestinal tract 01/25/2012  . Hyponatremia 01/24/2012  . Hematuria 01/24/2012  . Enteritis 12/24/2011  . Abdominal pain, other specified site 12/24/2011  . Leg pain, right 02/18/2011  .  Rheumatoid arthritis ([place]) 02/04/2011  . Temporal arteritis ([place]) 09/01/2010  . Hypertension 09/01/2010  . Hyperlipidemia 09/01/2010  . History of atrial fibrillation 09/01/2010  . CAD (coronary artery disease) 09/01/2010  . GERD (gastroesophageal reflux disease) 09/01/2010  . Insomnia 09/01/2010   Kerin Perna,  PTA 06/21/20 4:12 PM  [facility] [address] [place], [place], [address] Phone: [phone]   Fax:  [phone]  Name: Betty [place] MRN: [id number] Date of Birth: 27-Nov-1934

## 2020-06-21 NOTE — Progress Notes (Signed)
Established Patient Office Visit  Subjective:  Patient ID: Betty Jackson, female    DOB: Mar 28, 1935  Age: 86 y.o. MRN: [id number]  CC:  Chief Complaint  Patient presents with  . Follow-up    HPI Betty Jackson presents for medical follow-up.  She has complicated past medical history with multiple problems including history of CAD, atrial fibrillation, history of chronic diastolic heart failure, history of giant cell arteritis, hypertension, osteoporosis, rheumatoid arthritis, chronic kidney disease, chronic insomnia.  She is accompanied today by a caregiver.  She continues to have fairly severe low back pain with radiation toward the right buttock.  She has had multiple surgeries and injections in the past.  Was basically told by neurosurgery there was not much else they could do.  She is currently treated for chronic pain management with Duragesic patch 25 mcg every 3 days she takes some hydrocodone for breakthrough pain.  She states her pain tends to be worse in the mornings.  A month ago her blood pressure was elevated and we added losartan 50 mg daily.  We had instructed him to follow-up to get basic metabolic panel.  Her blood pressure is improved today.  She is tolerating with no side effects.  She remains on Eliquis for her chronic atrial fibrillation.  Also takes metoprolol twice daily.  Is also on diltiazem 240 mg daily.  She is on Fosamax for osteoporosis 70 mg weekly  Past Medical History:  Diagnosis Date  . Acute respiratory failure with hypoxia ([place]) 05/20/2017  . Anemia, unspecified 10/28/2012  . Anxiety state, unspecified 10/28/2012  . CAD (coronary artery disease)    a. Stent RCA in [facility];  b. Cath approx 2009 - nonobs per pt report.  . Cataract    immature on the left eye  . Chronic insomnia 02/07/2013  . Chronic lower back pain    scoliosis  . CKD (chronic kidney disease) stage 3, GFR 30-59 ml/min (HCC)   . CVA (cerebral infarction) 10/29/2012  . DDD  (degenerative disc disease)   . Depression   . Diverticulosis   . GERD (gastroesophageal reflux disease) 09/01/2010  . Hemorrhoids   . Herniated nucleus pulposus, L5-S1, right 11/04/2015  . Hyperlipidemia    takes Lipitor daily  . Hypertension    takes Amlodipine,Losartan,Metoprolol,and HCTZ daily  . Incontinence of urine   . Insomnia    takes Restoril nightly  . Lumbar stenosis 04/25/2013  . Major depressive disorder, recurrent episode, moderate ([place]) 07/16/2013  . Osteoporosis   . PAF (paroxysmal atrial fibrillation) ([place]) 2011   a. lone epidode in 2011 according to notes.  . Rheumatoid arthritis ([place])   . Scoliosis   . Sepsis ([place]) 05/2019  . Temporal arteritis ([place]) 2011   a. followed @ [facility]; potential flareup 10/28/2012/notes 10/28/2012  . Vocal cord dysfunction    "they don't operate properly" (10/28/2012)    Past Surgical History:  Procedure Laterality Date  . ABDOMINAL HYSTERECTOMY  ~ 1984   vaginally  . BACK SURGERY  7-40yrs ago   X Stop  . BLADDER SUSPENSION  2001  . BREAST BIOPSY Right   . CATARACT EXTRACTION W/ INTRAOCULAR LENS IMPLANT Right ~ 08/2012  . CHEST TUBE INSERTION Left 03/08/2019   Procedure: Chest Tube Insertion;  Surgeon: Ivin Poot, MD;  Location: [place];  Service: Thoracic;  Laterality: Left;  . COLONOSCOPY  01/26/2012   Procedure: COLONOSCOPY;  Surgeon: Ladene Artist, MD,FACG;  Location: [facility] ENDOSCOPY;  Service: Endoscopy;  Laterality: N/A;  note the EGD is possible  . CORONARY ANGIOPLASTY WITH STENT PLACEMENT  2006   X 1 stent  . EPIDURAL BLOCK INJECTION    . ESOPHAGOGASTRODUODENOSCOPY  01/26/2012   Procedure: ESOPHAGOGASTRODUODENOSCOPY (EGD);  Surgeon: Ladene Artist, MD,FACG;  Location: [facility] ENDOSCOPY;  Service: Endoscopy;  Laterality: N/A;  . FINE NEEDLE ASPIRATION Right 09/26/2019   Procedure: FINE NEEDLE ASPIRATION;  Surgeon: Altamese [place], MD;  Location: [place];  Service: Orthopedics;  Laterality: Right;  . HEMIARTHROPLASTY HIP Right 2012  . IR  EPIDUROGRAPHY  04/20/2019  . LAPAROSCOPIC CHOLECYSTECTOMY  2001  . LUMBAR FUSION  03/2013  . LUMBAR LAMINECTOMY/DECOMPRESSION MICRODISCECTOMY Right 11/04/2015   Procedure: Right Lumbar Five-Sacral One Microdiskectomy;  Surgeon: Kristeen Miss, MD;  Location: [place] NEURO ORS;  Service: Neurosurgery;  Laterality: Right;  Right L5-S1 Microdiskectomy  . moles removed that required stiches     one on leg and one on face  . ORIF FEMUR FRACTURE Right 09/26/2019   Procedure: OPEN REDUCTION INTERNAL FIXATION (ORIF) DISTAL FEMUR FRACTURE;  Surgeon: Altamese Allen, MD;  Location: [place];  Service: Orthopedics;  Laterality: Right;  . ORIF FEMUR FRACTURE Right 10/17/2019   Procedure: OPEN REDUCTION INTERNAL FIXATION (ORIF) DISTAL FEMUR FRACTURE;  Surgeon: Altamese Tr[place]ull, MD;  Location: [place];  Service: Orthopedics;  Laterality: Right;  . SUBXYPHOID PERICARDIAL WINDOW N/A 03/08/2019   Procedure: SUBXYPHOID PERICARDIAL WINDOW;  Surgeon: Ivin Poot, MD;  Location: [place];  Service: Thoracic;  Laterality: N/A;  . TEE WITHOUT CARDIOVERSION N/A 03/08/2019   Procedure: TRANSESOPHAGEAL ECHOCARDIOGRAM (TEE);  Surgeon: Prescott Gum, Collier [place]ina, MD;  Location: [place];  Service: Thoracic;  Laterality: N/A;  . TEMPORAL ARTERY BIOPSY / LIGATION Bilateral 2011  . TONSILLECTOMY AND ADENOIDECTOMY     at age 46  . TUBAL LIGATION  ~ 1982  . X-STOP IMPLANTATION  ~ 2010   "lower back" (10/28/2012)    Family History  Problem Relation Age of Onset  . Brain cancer Mother 69  . Heart attack Father   . Breast cancer Daughter 57  . Heart disease Other   . Hypertension Other   . Arthritis Neg Hx   . Colon cancer Neg Hx   . Osteoporosis Neg Hx     Social History   Socioeconomic History  . Marital status: Married    Spouse name: Not on file  . Number of children: 3  . Years of education: Not on file  . Highest education level: Not on file  Occupational History  . Occupation: Retired Cabin crew  Tobacco Use  . Smoking status: Never  Smoker  . Smokeless tobacco: Never Used  Vaping Use  . Vaping Use: Never used  Substance and Sexual Activity  . Alcohol use: Yes    Comment: socially  . Drug use: No  . Sexual activity: Not on file  Other Topics Concern  . Not on file  Social History Narrative   Lives in [place] with her husband.  Retired Cabin crew.   Social Determinants of Health   Financial Resource Strain: Not on file  Food Insecurity: Not on file  Transportation Needs: Not on file  Physical Activity: Not on file  Stress: Not on file  Social Connections: Not on file  Intimate Partner Violence: Not on file    Outpatient Medications Prior to Visit  Medication Sig Dispense Refill  . alendronate (FOSAMAX) 70 MG tablet Take 1 tablet (70 mg total) by mouth every 7 (seven) days. Take with a full glass of  water on an empty stomach. 4 tablet 11  . ALPRAZolam (XANAX) 0.5 MG tablet Take 1 tablet (0.5 mg total) by mouth at bedtime as needed for sleep. 7 tablet 0  . cholecalciferol (VITAMIN D) 25 MCG tablet Take 2 tablets (2,000 Units total) by mouth 2 (two) times daily.    Marland Kitchen diltiazem (CARDIZEM CD) 240 MG 24 hr capsule Take 1 capsule (240 mg total) by mouth daily. 90 capsule 3  . ELIQUIS 5 MG TABS tablet TAKE 1 TABLET BY MOUTH TWICE A DAY 60 tablet 5  . escitalopram (LEXAPRO) 10 MG tablet Take 1 tablet (10 mg total) by mouth daily. 90 tablet 1  . famotidine (PEPCID) 20 MG tablet Take 1 tablet (20 mg total) by mouth daily. 30 tablet 1  . fentaNYL (DURAGESIC) 25 MCG/HR Place 1 patch onto the skin every 3 (three) days. 10 patch 0  . fentaNYL (DURAGESIC) 25 MCG/HR Place one patch onto the skin every 3 days.   May refill in one month. 10 patch 0  . fentaNYL (DURAGESIC) 25 MCG/HR Place one patch onto the skin every 3 days.  May refill in two months. 10 patch 0  . ferrous sulfate 325 (65 FE) MG tablet Take 1 tablet (325 mg total) by mouth 2 (two) times daily with a meal. 60 tablet 3  . furosemide (LASIX) 40 MG tablet TAKE 1  TABLET BY MOUTH EVERY DAY 30 tablet 0  . HYDROcodone-acetaminophen (NORCO) 10-325 MG tablet Take one tablet by mouth every 8 hours as needed for breakthrough pain. 30 tablet 0  . LACTOBACILLUS PO Take 2 capsules by mouth 3 (three) times daily.    Marland Kitchen losartan (COZAAR) 50 MG tablet Take 1 tablet (50 mg total) by mouth daily. 90 tablet 3  . metoprolol tartrate (LOPRESSOR) 50 MG tablet Take 1 tablet (50 mg total) by mouth 2 (two) times daily. 30 tablet 0  . Multiple Vitamin (MULTIVITAMIN WITH MINERALS) TABS tablet Take 1 tablet by mouth daily. 60 tablet 0  . potassium chloride (KLOR-CON) 10 MEQ tablet Take 1 tablet (10 mEq total) by mouth daily. 90 tablet 2  . predniSONE (DELTASONE) 10 MG tablet Take 1 tablet (10 mg total) by mouth daily with breakfast. 30 tablet 0  . senna-docusate (SENOKOT-S) 8.6-50 MG tablet Take 1 tablet by mouth at bedtime.    . vitamin C (ASCORBIC ACID) 500 MG tablet Take 1,000 mg by mouth daily.    . vitamin E 180 MG (400 UNITS) capsule Take 400 Units by mouth daily.     No facility-administered medications prior to visit.    Allergies  Allergen Reactions  . Dextromethorphan Rash    ROS Review of Systems  Constitutional: Negative for appetite change, chills and fever.  Respiratory: Negative for cough and shortness of breath.   Cardiovascular: Negative for chest pain.  Gastrointestinal: Negative for abdominal pain.  Musculoskeletal: Positive for back pain.  Neurological: Negative for headaches.  Hematological: Negative for adenopathy.      Objective:    Physical Exam Vitals reviewed.  Cardiovascular:     Rate and Rhythm: Normal rate.  Pulmonary:     Effort: Pulmonary effort is normal.     Breath sounds: Normal breath sounds.  Musculoskeletal:     Comments: No pitting edema  Neurological:     Mental Status: She is alert.     BP 136/80   Pulse 81   Ht 5\' 4"  (1.626 m)   SpO2 97%   BMI 27.12 kg/m  Wt  Readings from Last 3 Encounters:  05/24/20 158  lb (71.7 kg)  03/20/20 157 lb 6.4 oz (71.4 kg)  02/20/20 152 lb (68.9 kg)     Health Maintenance Due  Topic Date Due  . COVID-19 Vaccine (1) Never done  . FOOT EXAM  Never done  . OPHTHALMOLOGY EXAM  Never done  . DEXA SCAN  Never done  . PNA vac Low Risk Adult (2 of 2 - PPSV23) 04/02/2009  . HEMOGLOBIN A1C  09/05/2019    There are no preventive care reminders to display for this patient.  Lab Results  Component Value Date   TSH 3.085 03/06/2019   Lab Results  Component Value Date   WBC 15.8 (H) 03/20/2020   HGB 13.2 03/20/2020   HCT 38.7 03/20/2020   MCV 95.1 03/20/2020   PLT 152 03/20/2020   Lab Results  Component Value Date   NA 140 05/24/2020   K 3.8 05/24/2020   CO2 33 (H) 05/24/2020   GLUCOSE 124 (H) 05/24/2020   BUN 13 05/24/2020   CREATININE 0.86 05/24/2020   BILITOT 1.6 (H) 10/26/2019   ALKPHOS 121 10/26/2019   AST 17 10/26/2019   ALT 16 10/26/2019   PROT 5.1 (L) 10/26/2019   ALBUMIN 2.1 (L) 10/26/2019   CALCIUM 9.3 05/24/2020   ANIONGAP 11 10/30/2019   GFR 62.27 05/24/2020   Lab Results  Component Value Date   CHOL 171 11/14/2018   Lab Results  Component Value Date   HDL 63.40 11/14/2018   Lab Results  Component Value Date   LDLCALC 74 11/14/2018   Lab Results  Component Value Date   TRIG 168.0 (H) 11/14/2018   Lab Results  Component Value Date   CHOLHDL 3 11/14/2018   Lab Results  Component Value Date   HGBA1C 5.9 (H) 03/07/2019      Assessment & Plan:   #1 hypertension improved with recent addition of losartan  -Check basic metabolic panel today  #2 chronic low back pain with multiple prior surgeries.  She is on foundation drug of Duragesic 25 mcg patch every 72 hours.  She takes hydrocodone for breakthrough pain.  Overall fairly stable. -We discussed again today issues of respiratory suppression and the risk of that and certainly avoid benzos and any other sedative hypnotics with her current medications  #3 chronic atrial  fibrillation rate controlled -Continue Cardizem, metoprolol, Eliquis  #4 osteoporosis -Continue Fosamax 70 mg weekly  No orders of the defined types were placed in this encounter.   Follow-up: Return in about 3 months (around 09/21/2020).    Carolann Littler, MD

## 2020-06-24 ENCOUNTER — Ambulatory Visit: Payer: Medicare Other

## 2020-06-24 ENCOUNTER — Ambulatory Visit (HOSPITAL_BASED_OUTPATIENT_CLINIC_OR_DEPARTMENT_OTHER): Payer: Medicare Other | Admitting: Physical Therapy

## 2020-06-26 ENCOUNTER — Other Ambulatory Visit: Payer: Self-pay

## 2020-06-26 ENCOUNTER — Ambulatory Visit (HOSPITAL_BASED_OUTPATIENT_CLINIC_OR_DEPARTMENT_OTHER): Payer: Medicare Other | Admitting: Physical Therapy

## 2020-06-26 DIAGNOSIS — R262 Difficulty in walking, not elsewhere classified: Secondary | ICD-10-CM | POA: Diagnosis not present

## 2020-06-26 DIAGNOSIS — M6281 Muscle weakness (generalized): Secondary | ICD-10-CM | POA: Diagnosis not present

## 2020-06-26 DIAGNOSIS — R293 Abnormal posture: Secondary | ICD-10-CM | POA: Diagnosis not present

## 2020-06-26 DIAGNOSIS — R2689 Other abnormalities of gait and mobility: Secondary | ICD-10-CM | POA: Diagnosis not present

## 2020-06-26 DIAGNOSIS — M25551 Pain in right hip: Secondary | ICD-10-CM | POA: Diagnosis not present

## 2020-06-26 NOTE — Therapy (Signed)
[place], [place], [address] Phone: [phone]   Fax:  [phone]  Physical Therapy Treatment  Patient Details  Name: Betty Jackson MRN: [id number] Date of Birth: 1934/11/21 Referring Provider (PT): Altamese Naples, MD   Encounter Date: 06/26/2020   PT End of Session - 06/26/20 6270    Visit Number 17    Number of Visits 25    Date for PT Re-Evaluation 07/08/20    Authorization Type UHC Medicare    PT Start Time 1145    PT Stop Time 1230    PT Time Calculation (min) 45 min    Activity Tolerance Patient tolerated treatment well    Behavior During Therapy [facility] for tasks assessed/performed           Past Medical History:  Diagnosis Date  . Acute respiratory failure with hypoxia ([place]) 05/20/2017  . Anemia, unspecified 10/28/2012  . Anxiety state, unspecified 10/28/2012  . CAD (coronary artery disease)    a. Stent RCA in [facility];  b. Cath approx 2009 - nonobs per pt report.  . Cataract    immature on the left eye  . Chronic insomnia 02/07/2013  . Chronic lower back pain    scoliosis  . CKD (chronic kidney disease) stage 3, GFR 30-59 ml/min (HCC)   . CVA (cerebral infarction) 10/29/2012  . DDD (degenerative disc disease)   . Depression   . Diverticulosis   . GERD (gastroesophageal reflux disease) 09/01/2010  . Hemorrhoids   . Herniated nucleus pulposus, L5-S1, right 11/04/2015  . Hyperlipidemia    takes Lipitor daily  . Hypertension    takes Amlodipine,Losartan,Metoprolol,and HCTZ daily  . Incontinence of urine   . Insomnia    takes Restoril nightly  . Lumbar stenosis 04/25/2013  . Major depressive disorder, recurrent episode, moderate ([place]) 07/16/2013  . Osteoporosis   . PAF (paroxysmal atrial fibrillation) ([place]) 2011   a. lone epidode in 2011 according to notes.  . Rheumatoid arthritis ([place])   . Scoliosis   . Sepsis ([place]) 05/2019  . Temporal arteritis ([place]) 2011   a. followed @ [facility];  potential flareup 10/28/2012/notes 10/28/2012  . Vocal cord dysfunction    "they don't operate properly" (10/28/2012)    Past Surgical History:  Procedure Laterality Date  . ABDOMINAL HYSTERECTOMY  ~ 1984   vaginally  . BACK SURGERY  7-58yrs ago   X Stop  . BLADDER SUSPENSION  2001  . BREAST BIOPSY Right   . CATARACT EXTRACTION W/ INTRAOCULAR LENS IMPLANT Right ~ 08/2012  . CHEST TUBE INSERTION Left 03/08/2019   Procedure: Chest Tube Insertion;  Surgeon: Ivin Poot, MD;  Location: [place];  Service: Thoracic;  Laterality: Left;  . COLONOSCOPY  01/26/2012   Procedure: COLONOSCOPY;  Surgeon: Ladene Artist, MD,FACG;  Location: [facility] ENDOSCOPY;  Service: Endoscopy;  Laterality: N/A;  note the EGD is possible  . CORONARY ANGIOPLASTY WITH STENT PLACEMENT  2006   X 1 stent  . EPIDURAL BLOCK INJECTION    . ESOPHAGOGASTRODUODENOSCOPY  01/26/2012   Procedure: ESOPHAGOGASTRODUODENOSCOPY (EGD);  Surgeon: Ladene Artist, MD,FACG;  Location[facility] ENDOSCOPY;  Service: Endoscopy;  Laterality: N/A;  . FINE NEEDLE ASPIRATION Right 09/26/2019   Procedure: FINE NEEDLE ASPIRATION;  Surgeon: Altamese Corning, MD;  Location: [place];  Service: Orthopedics;  Laterality: Right;  . HEMIARTHROPLASTY HIP Right 2012  . IR EPIDUROGRAPHY  04/20/2019  . LAPAROSCOPIC CHOLECYSTECTOMY  2001  . LUMBAR FUSION  03/2013  .  LUMBAR LAMINECTOMY/DECOMPRESSION MICRODISCECTOMY Right 11/04/2015   Procedure: Right Lumbar Five-Sacral One Microdiskectomy;  Surgeon: Kristeen Miss, MD;  Location: [place] NEURO ORS;  Service: Neurosurgery;  Laterality: Right;  Right L5-S1 Microdiskectomy  . moles removed that required stiches     one on leg and one on face  . ORIF FEMUR FRACTURE Right 09/26/2019   Procedure: OPEN REDUCTION INTERNAL FIXATION (ORIF) DISTAL FEMUR FRACTURE;  Surgeon: Altamese Churdan, MD;  Location: [place];  Service: Orthopedics;  Laterality: Right;  . ORIF FEMUR FRACTURE Right 10/17/2019   Procedure: OPEN REDUCTION INTERNAL FIXATION (ORIF)  DISTAL FEMUR FRACTURE;  Surgeon: Altamese Audrain, MD;  Location: [place];  Service: Orthopedics;  Laterality: Right;  . SUBXYPHOID PERICARDIAL WINDOW N/A 03/08/2019   Procedure: SUBXYPHOID PERICARDIAL WINDOW;  Surgeon: Ivin Poot, MD;  Location: [place];  Service: Thoracic;  Laterality: N/A;  . TEE WITHOUT CARDIOVERSION N/A 03/08/2019   Procedure: TRANSESOPHAGEAL ECHOCARDIOGRAM (TEE);  Surgeon: Prescott Gum, Collier Salina, MD;  Location: [place];  Service: Thoracic;  Laterality: N/A;  . TEMPORAL ARTERY BIOPSY / LIGATION Bilateral 2011  . TONSILLECTOMY AND ADENOIDECTOMY     at age 70  . TUBAL LIGATION  ~ 1982  . X-STOP IMPLANTATION  ~ 2010   "lower back" (10/28/2012)    There were no vitals filed for this visit.   Subjective Assessment - 06/26/20 1157    Subjective Pt states her R butt continues to hurt today. Pt states she did stand a little but not a whole lot.    Patient is accompained by: Family member   daughter   Pertinent History CAD, hypertension, history of temporal arteritis, atrial fibrillation, GERD, chronic back pain with multiple prior surgeries, osteoarthritis involving multiple joints    Currently in Pain? Yes    Pain Score 6     Pain Location Buttocks    Pain Orientation Right;Posterior    Pain Descriptors / Indicators Sore                             [facility] Adult PT Treatment/Exercise - 06/26/20 0001      Knee/Hip Exercises: Supine   Other Supine Knee/Hip Exercises Clamshell 2x10 orange tband      Knee/Hip Exercises: Sidelying   Clams x10    Other Sidelying Knee/Hip Exercises Knee flex to extension AAROM 2x10      Knee/Hip Exercises: Prone   Other Prone Exercises Knee flexion AROM      Manual Therapy   Joint Mobilization Grade II to III hip mobilization inferior & lateral; hip distraction x30 sec.    Soft tissue mobilization STW & TPR along R glute and hamstring                    PT Short Term Goals - 06/10/20 1157      PT SHORT TERM GOAL #1    Title Pt will be independent with HEP    Time 4    Period Weeks    Status Achieved    Target Date 06/10/20      PT SHORT TERM GOAL #2   Title Pt will be able to perform 5x STS in <15 sec to demo improved functional strength    Baseline 19 sec; 15 sec 06/10/20    Time 4    Period Weeks    Status Achieved    Target Date 06/10/20      PT SHORT TERM GOAL #3   Title  Pt will be able to decrease TUG score by at least 10 sec to demo MCID    Baseline 40 sec; 28 sec on 06/10/20    Time 4    Period Weeks    Status Achieved    Target Date 06/10/20      PT SHORT TERM GOAL #4   Title Pt will be fully independent with transfers    Baseline Requires min A at times    Time 4    Period Weeks    Status Achieved    Target Date 06/10/20             PT Long Term Goals - 05/13/20 1411      PT LONG TERM GOAL #1   Title Pt will be independent with maintaining mobility at home    Time 8    Period Weeks    Status New    Target Date 07/08/20      PT LONG TERM GOAL #2   Title Pt will have improved 5 x STS to </=13 sec to demo decreased fall risk    Baseline 19 sec    Time 8    Period Weeks    Status New    Target Date 07/08/20      PT LONG TERM GOAL #3   Title Pt will have improved TUG score to </=20 sec    Baseline 40 sec    Time 8    Period Weeks    Status New    Target Date 07/08/20      PT LONG TERM GOAL #4   Title Pt will have improved Berg Score to at least 37/56 to demo [place] for decreased fall risk    Baseline 29/56    Time 8    Period Weeks    Status New    Target Date 07/08/20                 Plan - 06/26/20 1239    Clinical Impression Statement Pt continues to have R buttock pain. No pain noted with hip PROM; pt states she only feels it when she moves her leg. Upon palpation, pt found to have large muscle knot along glutes and hamstrings. Performed manual therapy, gentle ROM, and hip abduction strengthening. Large muscle spasm noted during TPR and STW. Pt  reports improved pain after session.    Personal Factors and Comorbidities Age;Fitness;Comorbidity 1;Comorbidity 2;Comorbidity 3+;Time since onset of injury/illness/exacerbation    Comorbidities CAD, hypertension, history of temporal arteritis, atrial fibrillation, GERD, chronic back pain with multiple prior surgeries, osteoarthritis involving multiple joints    Examination-Activity Limitations Bed Mobility;Bend;Locomotion Level;Transfers;Dressing;Stand;Squat    Examination-Participation Restrictions Community Activity;Cleaning;Shop;Laundry    Stability/Clinical Decision Making Evolving/Moderate complexity    Rehab Potential Good    PT Frequency 3x / week   2x/wk on land; 1x/wk aquatics   PT Duration 8 weeks    PT Treatment/Interventions Aquatic Therapy;ADLs/Self Care Home Management;Cryotherapy;Electrical Stimulation;Biofeedback;Moist Heat;DME Instruction;Gait training;Stair training;Functional mobility training;Therapeutic activities;Therapeutic exercise;Balance training;Neuromuscular re-education;Manual techniques;Patient/family education;Orthotic Fit/Training;Dry needling;Taping;Vasopneumatic Device    PT Next Visit Plan [place]: Continue to work on glute/hip extensor and quad strengthening. Manual therapy & strengthening as needed for L ankle DF and R posterior hip/glute. Work on endurance in upright posture and gait.    PT Home Exercise Plan Access Code: [email or web address]; pt to work on standing for a total of at least 10 minutes in her day (can be split 2x5 min sessions or 5x2 min sessions)    Consulted  and Agree with Plan of Care Patient;Family member/caregiver    Family Member Consulted daughter           Patient will benefit from skilled therapeutic intervention in order to improve the following deficits and impairments:  Abnormal gait,Decreased range of motion,Difficulty walking,Decreased endurance,Decreased activity tolerance,Pain,Decreased balance,Hypomobility,Improper body mechanics,Decreased  mobility,Decreased strength,Postural dysfunction  Visit Diagnosis: Muscle weakness (generalized)  Abnormal posture  Pain in right hip  Difficulty in walking, not elsewhere classified  Other abnormalities of gait and mobility     Problem List Patient Active Problem List   Diagnosis Date Noted  . Cough 05/24/2020  . Dyspnea 05/24/2020  . Optic neuritis 05/24/2020  . Osteopenia 05/24/2020  . Rash 05/24/2020  . Vitamin D deficiency 05/24/2020  . Palliative care by specialist   . Goals of care, counseling/discussion   . DNR (do not resuscitate)   . Weakness   . Advanced directives, counseling/discussion   . Pressure injury of skin 10/18/2019  . Closed fracture of right distal femur ([place]) 10/17/2019  . Femur fracture, right ([place]) 09/25/2019  . Chronic diastolic CHF (congestive heart failure) ([place]) 09/25/2019  . HCAP (healthcare-associated pneumonia) 06/19/2019  . Encephalopathy 06/19/2019  . Lumbar degenerative disc disease   . Paroxysmal A-fib ([place])   . Hypomagnesemia   . C. difficile colitis   . Diarrhea 05/29/2019  . CHF (congestive heart failure) ([place]) 05/29/2019  . Recurrent falls 04/30/2019  . Pericardial effusion without cardiac tamponade 03/29/2019  . Hypoalbuminemia 03/27/2019  . Cardiac tamponade   . Pericarditis 03/06/2019  . Fever   . Septic shock ([place])   . Atypical chest pain   . Atrial fibrillation with rapid ventricular response ([place])   . Nausea 01/17/2019  . Acute diverticulitis 01/13/2019  . Chronic back pain 01/13/2019  . Tachycardia 01/13/2019  . Hypokalemia 01/13/2019  . Chronic low back pain 07/10/2018  . Influenza A 05/20/2017  . Severe sepsis ([place]) 05/20/2017  . Acute respiratory failure with hypoxia ([place]) 05/20/2017  . CKD (chronic kidney disease), stage III ([place]) 05/20/2017  . PAF (paroxysmal atrial fibrillation) ([place]) 05/20/2017  . HTN (hypertension) 05/20/2017  . Giant cell arteritis ([place]) 05/20/2017  . Coronary disease 05/20/2017   . Abnormal urinalysis 05/20/2017  . Osteoporosis 05/08/2016  . Herniated nucleus pulposus, L5-S1, right 11/04/2015  . Major depressive disorder, recurrent episode, moderate ([place]) 07/16/2013  . Abdominal pain, epigastric 06/22/2013  . Lumbar stenosis 04/25/2013  . Chronic insomnia 02/07/2013  . Elevated transaminase level 02/07/2013  . CVA (cerebral infarction) 10/29/2012  . Visual changes 10/28/2012  . Depression 10/28/2012  . Anxiety state 10/28/2012  . Anemia, unspecified 10/28/2012  . Nonspecific (abnormal) findings on radiological and other examination of gastrointestinal tract 01/25/2012  . Hyponatremia 01/24/2012  . Hematuria 01/24/2012  . Enteritis 12/24/2011  . Abdominal pain, other specified site 12/24/2011  . Leg pain, right 02/18/2011  . Rheumatoid arthritis ([place]) 02/04/2011  . Temporal arteritis ([place]) 09/01/2010  . Hypertension 09/01/2010  . Hyperlipidemia 09/01/2010  . History of atrial fibrillation 09/01/2010  . CAD (coronary artery disease) 09/01/2010  . GERD (gastroesophageal reflux disease) 09/01/2010  . Insomnia 09/01/2010    Eon Zunker April Ma L Houa Nie PT, DPT 06/26/2020, 12:44 PM  [facility] [place], [place], [address] Phone: [phone]   Fax:  [phone]  Name: PATRISIA FAETH MRN: [id number] Date of Birth: 12/05/1934

## 2020-06-28 ENCOUNTER — Ambulatory Visit (HOSPITAL_BASED_OUTPATIENT_CLINIC_OR_DEPARTMENT_OTHER): Payer: Medicare Other | Admitting: Physical Therapy

## 2020-06-28 DIAGNOSIS — M533 Sacrococcygeal disorders, not elsewhere classified: Secondary | ICD-10-CM | POA: Diagnosis not present

## 2020-06-28 DIAGNOSIS — M7071 Other bursitis of hip, right hip: Secondary | ICD-10-CM | POA: Diagnosis not present

## 2020-06-29 DIAGNOSIS — I5032 Chronic diastolic (congestive) heart failure: Secondary | ICD-10-CM | POA: Diagnosis not present

## 2020-06-29 DIAGNOSIS — M5127 Other intervertebral disc displacement, lumbosacral region: Secondary | ICD-10-CM | POA: Diagnosis not present

## 2020-06-29 DIAGNOSIS — S72401A Unspecified fracture of lower end of right femur, initial encounter for closed fracture: Secondary | ICD-10-CM | POA: Diagnosis not present

## 2020-06-29 DIAGNOSIS — R531 Weakness: Secondary | ICD-10-CM | POA: Diagnosis not present

## 2020-07-01 ENCOUNTER — Other Ambulatory Visit: Payer: Self-pay

## 2020-07-01 ENCOUNTER — Ambulatory Visit (HOSPITAL_BASED_OUTPATIENT_CLINIC_OR_DEPARTMENT_OTHER): Payer: Medicare Other | Attending: Orthopedic Surgery | Admitting: Physical Therapy

## 2020-07-01 DIAGNOSIS — R262 Difficulty in walking, not elsewhere classified: Secondary | ICD-10-CM | POA: Diagnosis not present

## 2020-07-01 DIAGNOSIS — M6281 Muscle weakness (generalized): Secondary | ICD-10-CM | POA: Insufficient documentation

## 2020-07-01 DIAGNOSIS — R293 Abnormal posture: Secondary | ICD-10-CM | POA: Insufficient documentation

## 2020-07-01 DIAGNOSIS — M25551 Pain in right hip: Secondary | ICD-10-CM | POA: Diagnosis not present

## 2020-07-01 DIAGNOSIS — R2689 Other abnormalities of gait and mobility: Secondary | ICD-10-CM | POA: Diagnosis not present

## 2020-07-01 NOTE — Therapy (Signed)
[facility] [place], [place], [address] Phone: [phone]   Fax:  [phone]  Physical Therapy Treatment  Patient Details  Name: Betty Jackson MRN: [id number] Date of Birth: [birth year] Referring Provider (PT): Altamese [place], MD   Encounter Date: 07/01/2020   PT End of Session - 07/01/20 1237    Visit Number 18    Number of Visits 25    Date for PT Re-Evaluation 07/08/20    Authorization Type U[employer] Medicare    PT Start Time 1145    PT Stop Time 1230    PT Time Calculation (min) 45 min    Activity Tolerance Patient tolerated treatment well    Behavior During Therapy [facility] for tasks assessed/performed           Past Medical History:  Diagnosis Date  . Acute respiratory failure with hypoxia ([place]) 05/20/2017  . Anemia, unspecified 10/28/2012  . Anxiety state, unspecified 10/28/2012  . CAD (coronary artery disease)    a. Stent RCA in [facility];  b. Cath approx 2009 - nonobs per pt report.  . Cataract    immature on the left eye  . Chronic insomnia 02/07/2013  . Chronic lower back pain    scoliosis  . CKD (chronic kidney disease) stage 3, GFR 30-59 ml/min (HCC)   . CVA (cerebral infarction) 10/29/2012  . DDD (degenerative disc disease)   . Depression   . Diverticulosis   . GERD (gastroesophageal reflux disease) 09/01/2010  . Hemorrhoids   . Herniated nucleus pulposus, L5-S1, right 11/04/2015  . Hyperlipidemia    takes Lipitor daily  . Hypertension    takes Amlodipine,Losartan,Metoprolol,and HCTZ daily  . Incontinence of urine   . Insomnia    takes Restoril nightly  . Lumbar stenosis 04/25/2013  . Major depressive disorder, recurrent episode, moderate ([place]) 07/16/2013  . Osteoporosis   . PAF (paroxysmal atrial fibrillation) ([place]) 2011   a. lone epidode in 2011 according to notes.  . Rheumatoid arthritis ([place])   . Scoliosis   . Sepsis ([place]) 05/2019  . Temporal arteritis ([place]) 2011   a. followed @ [facility];  potential flareup 10/28/2012/notes 10/28/2012  . Vocal cord dysfunction    "they don't operate properly" (10/28/2012)    Past Surgical History:  Procedure Laterality Date  . ABDOMINAL HYSTERECTOMY  ~ 1984   vaginally  . BACK SURGERY  7-32yrs ago   X Stop  . BLADDER SUSPENSION  2001  . BREAST BIOPSY Right   . CATARACT EXTRACTION W/ INTRAOCULAR LENS IMPLANT Right ~ 08/2012  . CHEST TUBE INSERTION Left 03/08/2019   Procedure: Chest Tube Insertion;  Surgeon: Ivin Poot, MD;  Location: [facility];  Service: Thoracic;  Laterality: Left;  . COLONOSCOPY  01/26/2012   Procedure: COLONOSCOPY;  Surgeon: Ladene Artist, MD,FACG;  Location: [facility] ENDOSCOPY;  Service: Endoscopy;  Laterality: N/A;  note the EGD is possible  . CORONARY ANGIOPLASTY WITH STENT PLACEMENT  2006   X 1 stent  . EPIDURAL BLOCK INJECTION    . ESOPHAGOGASTRODUODENOSCOPY  01/26/2012   Procedure: ESOPHAGOGASTRODUODENOSCOPY (EGD);  Surgeon: Ladene Artist, MD,FACG;  Location: [facility] ENDOSCOPY;  Service: Endoscopy;  Laterality: N/A;  . FINE NEEDLE ASPIRATION Right 09/26/2019   Procedure: FINE NEEDLE ASPIRATION;  Surgeon: Altamese Arco, MD;  Location: [facility];  Service: Orthopedics;  Laterality: Right;  . HEMIARTHROPLASTY HIP Right 2012  . IR EPIDUROGRAPHY  04/20/2019  . LAPAROSCOPIC CHOLECYSTECTOMY  2001  . LUMBAR FUSION  03/2013  .  LUMBAR LAMINECTOMY/DECOMPRESSION MICRODISCECTOMY Right 11/04/2015   Procedure: Right Lumbar Five-Sacral One Microdiskectomy;  Surgeon: Kristeen Miss, MD;  Location: [facility] NEURO ORS;  Service: Neurosurgery;  Laterality: Right;  Right L5-S1 Microdiskectomy  . moles removed that required stiches     one on leg and one on face  . ORIF FEMUR FRACTURE Right 09/26/2019   Procedure: OPEN REDUCTION INTERNAL FIXATION (ORIF) DISTAL FEMUR FRACTURE;  Surgeon: Altamese Kilbourne, MD;  Location: [facility];  Service: Orthopedics;  Laterality: Right;  . ORIF FEMUR FRACTURE Right 10/17/2019   Procedure: OPEN REDUCTION INTERNAL FIXATION (ORIF)  DISTAL FEMUR FRACTURE;  Surgeon: Altamese [place], MD;  Location: [facility];  Service: Orthopedics;  Laterality: Right;  . SUBXYPHOID PERICARDIAL WINDOW N/A 03/08/2019   Procedure: SUBXYPHOID PERICARDIAL WINDOW;  Surgeon: Ivin Poot, MD;  Location: [facility];  Service: Thoracic;  Laterality: N/A;  . TEE WITHOUT CARDIOVERSION N/A 03/08/2019   Procedure: TRANSESOPHAGEAL ECHOCARDIOGRAM (TEE);  Surgeon: Prescott Gum, Collier [place]ina, MD;  Location: [facility];  Service: Thoracic;  Laterality: N/A;  . TEMPORAL ARTERY BIOPSY / LIGATION Bilateral 2011  . TONSILLECTOMY AND ADENOIDECTOMY     at age 34  . TUBAL LIGATION  ~ 1982  . X-STOP IMPLANTATION  ~ 2010   "lower back" (10/28/2012)    There were no vitals filed for this visit.   Subjective Assessment - 07/01/20 1150    Subjective Pt reports she got a shot in her arm and that helped with her butt pain. Pt reports they did imaging but she wasn't told the results.    Patient is accompained by: Family member   daughter   Pertinent History CAD, hypertension, history of temporal arteritis, atrial fibrillation, GERD, chronic back pain with multiple prior surgeries, osteoarthritis involving multiple joints    Currently in Pain? Yes    Pain Score 4     Pain Location Buttocks    Pain Orientation Right;Posterior    Pain Descriptors / Indicators Sore    Pain Type Chronic pain                     [facility] Adult PT Treatment/Exercise - 07/01/20 0001      Knee/Hip Exercises: Stretches   Gastroc Stretch Right;Left;3 reps;20 seconds    Soleus Stretch Right;Left;1 rep;20 seconds      Knee/Hip Exercises: Aerobic   Nustep L4 x 5 min      Knee/Hip Exercises: Standing   Other Standing Knee Exercises Weightshifts L<>R x10    Other Standing Knee Exercises Static standing tolerance 2x30 sec hold      Knee/Hip Exercises: Seated   Other Seated Knee/Hip Exercises On pillow: Alternating LAQ + arm raises 2x10; V-sit 2x30 sec hold      Knee/Hip Exercises: Sidelying   Clams  2x10    Other Sidelying Knee/Hip Exercises Knee flex to extension AAROM 2x10                    PT Short Term Goals - 06/10/20 1157      PT SHORT TERM GOAL #1   Title Pt will be independent with HEP    Time 4    Period Weeks    Status Achieved    Target Date 06/10/20      PT SHORT TERM GOAL #2   Title Pt will be able to perform 5x STS in <15 sec to demo improved functional strength    Baseline 19 sec; 15 sec 06/10/20    Time 4  Period Weeks    Status Achieved    Target Date 06/10/20      PT SHORT TERM GOAL #3   Title Pt will be able to decrease TUG score by at least 10 sec to demo MCID    Baseline 40 sec; 28 sec on 06/10/20    Time 4    Period Weeks    Status Achieved    Target Date 06/10/20      PT SHORT TERM GOAL #4   Title Pt will be fully independent with transfers    Baseline Requires min A at times    Time 4    Period Weeks    Status Achieved    Target Date 06/10/20             PT Long Term Goals - 05/13/20 1411      PT LONG TERM GOAL #1   Title Pt will be independent with maintaining mobility at home    Time 8    Period Weeks    Status New    Target Date 07/08/20      PT LONG TERM GOAL #2   Title Pt will have improved 5 x STS to </=13 sec to demo decreased fall risk    Baseline 19 sec    Time 8    Period Weeks    Status New    Target Date 07/08/20      PT LONG TERM GOAL #3   Title Pt will have improved TUG score to </=20 sec    Baseline 40 sec    Time 8    Period Weeks    Status New    Target Date 07/08/20      PT LONG TERM GOAL #4   Title Pt will have improved Berg Score to at least 37/56 to demo [place] for decreased fall risk    Baseline 29/56    Time 8    Period Weeks    Status New    Target Date 07/08/20                 Plan - 07/01/20 1235    Clinical Impression Statement Pt with decreased buttock pain after tordol shot. No pain with PROM; increased pain with movement or weightbearing (including in sitting).  Performed manual therapy, gentle PROM, hip abduction and extension strengthening. Attempted to continue to work on standing. Caregiver states pt is to get imaging for potential bursitis.    Personal Factors and Comorbidities Age;Fitness;Comorbidity 1;Comorbidity 2;Comorbidity 3+;Time since onset of injury/illness/exacerbation    Comorbidities CAD, hypertension, history of temporal arteritis, atrial fibrillation, GERD, chronic back pain with multiple prior surgeries, osteoarthritis involving multiple joints    Examination-Activity Limitations Bed Mobility;Bend;Locomotion Level;Transfers;Dressing;Stand;Squat    Examination-Participation Restrictions Community Activity;Cleaning;Shop;Laundry    Stability/Clinical Decision Making Evolving/Moderate complexity    Rehab Potential Good    PT Frequency 3x / week   2x/wk on land; 1x/wk aquatics   PT Duration 8 weeks    PT Treatment/Interventions Aquatic Therapy;ADLs/Self Care Home Management;Cryotherapy;Electrical Stimulation;Biofeedback;Moist Heat;DME Instruction;Gait training;Stair training;Functional mobility training;Therapeutic activities;Therapeutic exercise;Balance training;Neuromuscular re-education;Manual techniques;Patient/family education;Orthotic Fit/Training;Dry needling;Taping;Vasopneumatic Device    PT Next Visit Plan [place]: Continue to work on glute/hip extensor and quad strengthening. Manual therapy & strengthening as needed for L ankle DF and R posterior hip/glute. Work on endurance in upright posture and gait.    PT Home Exercise Plan Access Code: [email or web address]; pt to work on standing for a total of at least 10 minutes in her day (  can be split 2x5 min sessions or 5x2 min sessions)    Consulted and Agree with Plan of Care Patient;Family member/caregiver    Family Member Consulted Caregiver           Patient will benefit from skilled therapeutic intervention in order to improve the following deficits and impairments:  Abnormal gait,Decreased range  of motion,Difficulty walking,Decreased endurance,Decreased activity tolerance,Pain,Decreased balance,Hypomobility,Improper body mechanics,Decreased mobility,Decreased strength,Postural dysfunction  Visit Diagnosis: Muscle weakness (generalized)  Abnormal posture  Pain in right hip  Difficulty in walking, not elsewhere classified  Other abnormalities of gait and mobility     Problem List Patient Active Problem List   Diagnosis Date Noted  . Cough 05/24/2020  . Dyspnea 05/24/2020  . Optic neuritis 05/24/2020  . Osteopenia 05/24/2020  . Rash 05/24/2020  . Vitamin D deficiency 05/24/2020  . Palliative care by specialist   . Goals of care, counseling/discussion   . DNR (do not resuscitate)   . Weakness   . Advanced directives, counseling/discussion   . Pressure injury of skin 10/18/2019  . Closed fracture of right distal femur ([place]) 10/17/2019  . Femur fracture, right ([place]) 09/25/2019  . Chronic diastolic CHF (congestive heart failure) ([place]) 09/25/2019  . HCAP (healthcare-associated pneumonia) 06/19/2019  . Encephalopathy 06/19/2019  . Lumbar degenerative disc disease   . Paroxysmal A-fib ([place])   . Hypomagnesemia   . C. difficile colitis   . Diarrhea 05/29/2019  . CHF (congestive heart failure) ([place]) 05/29/2019  . Recurrent falls 04/30/2019  . Pericardial effusion without cardiac tamponade 03/29/2019  . Hypoalbuminemia 03/27/2019  . Cardiac tamponade   . Pericarditis 03/06/2019  . Fever   . Septic shock ([place])   . Atypical chest pain   . Atrial fibrillation with rapid ventricular response ([place])   . Nausea 01/17/2019  . Acute diverticulitis 01/13/2019  . Chronic back pain 01/13/2019  . Tachycardia 01/13/2019  . Hypokalemia 01/13/2019  . Chronic low back pain 07/10/2018  . Influenza A 05/20/2017  . Severe sepsis ([place]) 05/20/2017  . Acute respiratory failure with hypoxia ([place]) 05/20/2017  . CKD (chronic kidney disease), stage III ([place]) 05/20/2017  . PAF (paroxysmal  atrial fibrillation) ([place]) 05/20/2017  . HTN (hypertension) 05/20/2017  . Giant cell arteritis ([place]) 05/20/2017  . Coronary disease 05/20/2017  . Abnormal urinalysis 05/20/2017  . Osteoporosis 05/08/2016  . Herniated nucleus pulposus, L5-S1, right 11/04/2015  . Major depressive disorder, recurrent episode, moderate ([place]) 07/16/2013  . Abdominal pain, epigastric 06/22/2013  . Lumbar stenosis 04/25/2013  . Chronic insomnia 02/07/2013  . Elevated transaminase level 02/07/2013  . CVA (cerebral infarction) 10/29/2012  . Visual changes 10/28/2012  . Depression 10/28/2012  . Anxiety state 10/28/2012  . Anemia, unspecified 10/28/2012  . Nonspecific (abnormal) findings on radiological and other examination of gastrointestinal tract 01/25/2012  . Hyponatremia 01/24/2012  . Hematuria 01/24/2012  . Enteritis 12/24/2011  . Abdominal pain, other specified site 12/24/2011  . Leg pain, right 02/18/2011  . Rheumatoid arthritis ([place]) 02/04/2011  . Temporal arteritis ([place]) 09/01/2010  . Hypertension 09/01/2010  . Hyperlipidemia 09/01/2010  . History of atrial fibrillation 09/01/2010  . CAD (coronary artery disease) 09/01/2010  . GERD (gastroesophageal reflux disease) 09/01/2010  . Insomnia 09/01/2010    Yanely Mast April Ma L Sacora Hawbaker PT, DPT 07/01/2020, 12:48 PM  [facility] [place], [place], [address] Phone: [phone]   Fax:  [phone]  Name: Betty Jackson MRN: [id number] Date of Birth: [birth year]

## 2020-07-03 ENCOUNTER — Ambulatory Visit (HOSPITAL_BASED_OUTPATIENT_CLINIC_OR_DEPARTMENT_OTHER): Payer: Medicare Other | Admitting: Physical Therapy

## 2020-07-04 ENCOUNTER — Encounter: Payer: Self-pay | Admitting: Family Medicine

## 2020-07-05 ENCOUNTER — Other Ambulatory Visit: Payer: Self-pay

## 2020-07-05 ENCOUNTER — Encounter (HOSPITAL_BASED_OUTPATIENT_CLINIC_OR_DEPARTMENT_OTHER): Payer: Self-pay | Admitting: Physical Therapy

## 2020-07-05 ENCOUNTER — Ambulatory Visit (HOSPITAL_BASED_OUTPATIENT_CLINIC_OR_DEPARTMENT_OTHER): Payer: Medicare Other | Admitting: Physical Therapy

## 2020-07-05 DIAGNOSIS — R293 Abnormal posture: Secondary | ICD-10-CM

## 2020-07-05 DIAGNOSIS — M6281 Muscle weakness (generalized): Secondary | ICD-10-CM | POA: Diagnosis not present

## 2020-07-05 DIAGNOSIS — M25551 Pain in right hip: Secondary | ICD-10-CM | POA: Diagnosis not present

## 2020-07-05 DIAGNOSIS — R2689 Other abnormalities of gait and mobility: Secondary | ICD-10-CM | POA: Diagnosis not present

## 2020-07-05 DIAGNOSIS — R262 Difficulty in walking, not elsewhere classified: Secondary | ICD-10-CM | POA: Diagnosis not present

## 2020-07-05 MED ORDER — HYDROCODONE-ACETAMINOPHEN 10-325 MG PO TABS: ORAL_TABLET | ORAL | 0 refills | Status: DC

## 2020-07-05 NOTE — Therapy (Addendum)
[place], [place], [address] Phone: [phone]   Fax:  [phone]  Physical Therapy Treatment  Patient Details  Name: Betty Jackson MRN: [id number] Date of Birth: [birth year] Referring Provider (PT): Altamese Merrill, MD   Encounter Date: 07/05/2020   PT End of Session - 07/05/20 1543    Visit Number 19    Number of Visits 25    Date for PT Re-Evaluation 07/08/20    Authorization Type [employer] Medicare    Progress Note Due on Visit 20    PT Start Time 1445    PT Stop Time 1528    PT Time Calculation (min) 43 min    Activity Tolerance Patient tolerated treatment well    Behavior During Therapy [facility] for tasks assessed/performed           Past Medical History:  Diagnosis Date  . Acute respiratory failure with hypoxia ([place]) 05/20/2017  . Anemia, unspecified 10/28/2012  . Anxiety state, unspecified 10/28/2012  . CAD (coronary artery disease)    a. Stent RCA in [facility];  b. Cath approx 2009 - nonobs per pt report.  . Cataract    immature on the left eye  . Chronic insomnia 02/07/2013  . Chronic lower back pain    scoliosis  . CKD (chronic kidney disease) stage 3, GFR 30-59 ml/min (HCC)   . CVA (cerebral infarction) 10/29/2012  . DDD (degenerative disc disease)   . Depression   . Diverticulosis   . GERD (gastroesophageal reflux disease) 09/01/2010  . Hemorrhoids   . Herniated nucleus pulposus, L5-S1, right 11/04/2015  . Hyperlipidemia    takes Lipitor daily  . Hypertension    takes Amlodipine,Losartan,Metoprolol,and HCTZ daily  . Incontinence of urine   . Insomnia    takes Restoril nightly  . Lumbar stenosis 04/25/2013  . Major depressive disorder, recurrent episode, moderate ([place]) 07/16/2013  . Osteoporosis   . PAF (paroxysmal atrial fibrillation) ([place]) 2011   a. lone epidode in 2011 according to notes.  . Rheumatoid arthritis ([place])   . Scoliosis   . Sepsis ([place]) 05/2019  . Temporal arteritis  ([place]) 2011   a. followed @ [facility]; potential flareup 10/28/2012/notes 10/28/2012  . Vocal cord dysfunction    "they don't operate properly" (10/28/2012)    Past Surgical History:  Procedure Laterality Date  . ABDOMINAL HYSTERECTOMY  ~ 86   vaginally  . BACK SURGERY  7-12yrs ago   X Stop  . BLADDER SUSPENSION  2001  . BREAST BIOPSY Right   . CATARACT EXTRACTION W/ INTRAOCULAR LENS IMPLANT Right ~ 08/2012  . CHEST TUBE INSERTION Left 03/08/2019   Procedure: Chest Tube Insertion;  Surgeon: Ivin Poot, MD;  Location: [place];  Service: Thoracic;  Laterality: Left;  . COLONOSCOPY  01/26/2012   Procedure: COLONOSCOPY;  Surgeon: Ladene Artist, MD,FACG;  Location: [facility] ENDOSCOPY;  Service: Endoscopy;  Laterality: N/A;  note the EGD is possible  . CORONARY ANGIOPLASTY WITH STENT PLACEMENT  2006   X 1 stent  . EPIDURAL BLOCK INJECTION    . ESOPHAGOGASTRODUODENOSCOPY  01/26/2012   Procedure: ESOPHAGOGASTRODUODENOSCOPY (EGD);  Surgeon: Ladene Artist, MD,FACG;  Location: [facility] ENDOSCOPY;  Service: Endoscopy;  Laterality: N/A;  . FINE NEEDLE ASPIRATION Right 09/26/2019   Procedure: FINE NEEDLE ASPIRATION;  Surgeon: Altamese Packwood, MD;  Location: [place];  Service: Orthopedics;  Laterality: Right;  . HEMIARTHROPLASTY HIP Right 2012  . IR EPIDUROGRAPHY  04/20/2019  . LAPAROSCOPIC CHOLECYSTECTOMY  2001  . LUMBAR FUSION  03/2013  . LUMBAR LAMINECTOMY/DECOMPRESSION MICRODISCECTOMY Right 11/04/2015   Procedure: Right Lumbar Five-Sacral One Microdiskectomy;  Surgeon: Kristeen Miss, MD;  Location: [place] NEURO ORS;  Service: Neurosurgery;  Laterality: Right;  Right L5-S1 Microdiskectomy  . moles removed that required stiches     one on leg and one on face  . ORIF FEMUR FRACTURE Right 09/26/2019   Procedure: OPEN REDUCTION INTERNAL FIXATION (ORIF) DISTAL FEMUR FRACTURE;  Surgeon: Altamese Covington, MD;  Location: [place];  Service: Orthopedics;  Laterality: Right;  . ORIF FEMUR FRACTURE Right 10/17/2019   Procedure: OPEN  REDUCTION INTERNAL FIXATION (ORIF) DISTAL FEMUR FRACTURE;  Surgeon: Altamese Orbisonia, MD;  Location: [place];  Service: Orthopedics;  Laterality: Right;  . SUBXYPHOID PERICARDIAL WINDOW N/A 03/08/2019   Procedure: SUBXYPHOID PERICARDIAL WINDOW;  Surgeon: Ivin Poot, MD;  Location: [place];  Service: Thoracic;  Laterality: N/A;  . TEE WITHOUT CARDIOVERSION N/A 03/08/2019   Procedure: TRANSESOPHAGEAL ECHOCARDIOGRAM (TEE);  Surgeon: Prescott Gum, Collier Salina, MD;  Location: [place];  Service: Thoracic;  Laterality: N/A;  . TEMPORAL ARTERY BIOPSY / LIGATION Bilateral 2011  . TONSILLECTOMY AND ADENOIDECTOMY     at age 86  . TUBAL LIGATION  ~ 1982  . X-STOP IMPLANTATION  ~ 2010   "lower back" (10/28/2012)    There were no vitals filed for this visit.   Subjective Assessment - 07/05/20 1546    Subjective "I just hurt.  I'm just wore out.  I go back to the doctor on Wednesday."  Pt reports she has been staying in bed most days.  She reports her family discourages her from trying to walk for fear that she'll fall.    Patient is accompained by: Family member   daughter   Pertinent History CAD, hypertension, history of temporal arteritis, atrial fibrillation, GERD, chronic back pain with multiple prior surgeries, osteoarthritis involving multiple joints    Currently in Pain? Yes    Pain Score 7     Pain Location Buttocks    Pain Orientation Right    Pain Descriptors / Indicators Aching;Sore;Sharp    Aggravating Factors  standing, walking    Pain Relieving Factors ??              [facility] PT Assessment - 07/05/20 0001      Assessment   Medical Diagnosis Repair of right distal femur, quad weakness    Referring Provider (PT) Altamese Spencer, MD    Onset Date/Surgical Date 10/17/19    Prior Therapy Has had [facility] and been to SNF in the past year           Pt seen for aquatic therapy today.  Treatment took place in water 3.25-3.75ft in depth at the [employer] pool. Temp of water was 91.  Pt  entered/exited the pool via stairs with CGA for safety with bilat rail.  Treatment:   Holding onto aqua jogger barbell - forward/ backward gait, and side stepping.   Leaning back against pool, single leg abdct x 5, and circles x 5 each.   Holding on to edge of the pool:  Heel raises x 5,  Squats x 10.   With nekdoodle at neck, noodle under arms: pt supine float, hip abd / add x 15 rep, flutter kick, forward bicycle,  Then leg press push off's against pool wall with feet.   Sitting on bench in pool -  Rt/Lt hamstring stretch in single leg long sitting.  Rt/Lt piriformis stretch in  sitting.  Gastroc stretch with therapist assist.   Holding onto aqua jogger barbell - forward gait with cues for upright posture.    Pt requires buoyancy for support and to offload joints with strengthening exercises. Viscosity of the water is needed for resistance of strengthening; water current perturbations provides challenge to standing balance unsupported, requiring increased core activation.       PT Short Term Goals - 06/10/20 1157      PT SHORT TERM GOAL #1   Title Pt will be independent with HEP    Time 4    Period Weeks    Status Achieved    Target Date 06/10/20      PT SHORT TERM GOAL #2   Title Pt will be able to perform 5x STS in <15 sec to demo improved functional strength    Baseline 19 sec; 15 sec 06/10/20    Time 4    Period Weeks    Status Achieved    Target Date 06/10/20      PT SHORT TERM GOAL #3   Title Pt will be able to decrease TUG score by at least 10 sec to demo MCID    Baseline 40 sec; 28 sec on 06/10/20    Time 4    Period Weeks    Status Achieved    Target Date 06/10/20      PT SHORT TERM GOAL #4   Title Pt will be fully independent with transfers    Baseline Requires min A at times    Time 4    Period Weeks    Status Achieved    Target Date 06/10/20             PT Long Term Goals - 05/13/20 1411      PT LONG TERM GOAL #1   Title Pt will be independent  with maintaining mobility at home    Time 8    Period Weeks    Status New    Target Date 07/08/20      PT LONG TERM GOAL #2   Title Pt will have improved 5 x STS to </=13 sec to demo decreased fall risk    Baseline 19 sec    Time 8    Period Weeks    Status New    Target Date 07/08/20      PT LONG TERM GOAL #3   Title Pt will have improved TUG score to </=20 sec    Baseline 40 sec    Time 8    Period Weeks    Status New    Target Date 07/08/20      PT LONG TERM GOAL #4   Title Pt will have improved Berg Score to at least 37/56 to demo [place] for decreased fall risk    Baseline 29/56    Time 8    Period Weeks    Status New    Target Date 07/08/20                 Plan - 07/05/20 1550    Clinical Impression Statement Pt tolerated aquatic exercises well, without increase in pain.  Pain in Rt buttocks remains constant despite the offloading properties of the water.  Pt's gait speed in water decreased from previous session.  She participated well, despite elevated pain rating and reported fatigue.   Pt making gradual progress towards remaining goals of therapy.    Personal Factors and Comorbidities Age;Fitness;Comorbidity 1;Comorbidity 2;Comorbidity 3+;Time since onset of  injury/illness/exacerbation    Comorbidities CAD, hypertension, history of temporal arteritis, atrial fibrillation, GERD, chronic back pain with multiple prior surgeries, osteoarthritis involving multiple joints    Examination-Activity Limitations Bed Mobility;Bend;Locomotion Level;Transfers;Dressing;Stand;Squat    Examination-Participation Restrictions Community Activity;Cleaning;Shop;Laundry    Stability/Clinical Decision Making Evolving/Moderate complexity    Rehab Potential Good    PT Frequency 3x / week   2x/wk on land; 1x/wk aquatics   PT Duration 8 weeks    PT Treatment/Interventions Aquatic Therapy;ADLs/Self Care Home Management;Cryotherapy;Electrical Stimulation;Biofeedback;Moist Heat;DME  Instruction;Gait training;Stair training;Functional mobility training;Therapeutic activities;Therapeutic exercise;Balance training;Neuromuscular re-education;Manual techniques;Patient/family education;Orthotic Fit/Training;Dry needling;Taping;Vasopneumatic Device    PT Next Visit Plan Check goals-  20th VISIT NOTE. (end of POC on 4/11) Trial of TENS, for pain management.    PT Home Exercise Plan Access Code: [email or web address]; pt to work on standing for a total of at least 10 minutes in her day (can be split 2x5 min sessions or 5x2 min sessions)    Consulted and Agree with Plan of Care Patient;Family member/caregiver    Family Member Consulted Caregiver           Patient will benefit from skilled therapeutic intervention in order to improve the following deficits and impairments:  Abnormal gait,Decreased range of motion,Difficulty walking,Decreased endurance,Decreased activity tolerance,Pain,Decreased balance,Hypomobility,Improper body mechanics,Decreased mobility,Decreased strength,Postural dysfunction  Visit Diagnosis: Muscle weakness (generalized)  Abnormal posture  Pain in right hip  Difficulty in walking, not elsewhere classified     Problem List Patient Active Problem List   Diagnosis Date Noted  . Cough 05/24/2020  . Dyspnea 05/24/2020  . Optic neuritis 05/24/2020  . Osteopenia 05/24/2020  . Rash 05/24/2020  . Vitamin D deficiency 05/24/2020  . Palliative care by specialist   . Goals of care, counseling/discussion   . DNR (do not resuscitate)   . Weakness   . Advanced directives, counseling/discussion   . Pressure injury of skin 10/18/2019  . Closed fracture of right distal femur ([place]) 10/17/2019  . Femur fracture, right ([place]) 09/25/2019  . Chronic diastolic CHF (congestive heart failure) ([place]) 09/25/2019  . HCAP (healthcare-associated pneumonia) 06/19/2019  . Encephalopathy 06/19/2019  . Lumbar degenerative disc disease   . Paroxysmal A-fib ([place])   . Hypomagnesemia   .  C. difficile colitis   . Diarrhea 05/29/2019  . CHF (congestive heart failure) ([place]) 05/29/2019  . Recurrent falls 04/30/2019  . Pericardial effusion without cardiac tamponade 03/29/2019  . Hypoalbuminemia 03/27/2019  . Cardiac tamponade   . Pericarditis 03/06/2019  . Fever   . Septic shock ([place])   . Atypical chest pain   . Atrial fibrillation with rapid ventricular response ([place])   . Nausea 01/17/2019  . Acute diverticulitis 01/13/2019  . Chronic back pain 01/13/2019  . Tachycardia 01/13/2019  . Hypokalemia 01/13/2019  . Chronic low back pain 07/10/2018  . Influenza A 05/20/2017  . Severe sepsis ([place]) 05/20/2017  . Acute respiratory failure with hypoxia ([place]) 05/20/2017  . CKD (chronic kidney disease), stage III ([place]) 05/20/2017  . PAF (paroxysmal atrial fibrillation) ([place]) 05/20/2017  . HTN (hypertension) 05/20/2017  . Giant cell arteritis ([place]) 05/20/2017  . Coronary disease 05/20/2017  . Abnormal urinalysis 05/20/2017  . Osteoporosis 05/08/2016  . Herniated nucleus pulposus, L5-S1, right 11/04/2015  . Major depressive disorder, recurrent episode, moderate ([place]) 07/16/2013  . Abdominal pain, epigastric 06/22/2013  . Lumbar stenosis 04/25/2013  . Chronic insomnia 02/07/2013  . Elevated transaminase level 02/07/2013  . CVA (cerebral infarction) 10/29/2012  . Visual changes 10/28/2012  . Depression 10/28/2012  .  Anxiety state 10/28/2012  . Anemia, unspecified 10/28/2012  . Nonspecific (abnormal) findings on radiological and other examination of gastrointestinal tract 01/25/2012  . Hyponatremia 01/24/2012  . Hematuria 01/24/2012  . Enteritis 12/24/2011  . Abdominal pain, other specified site 12/24/2011  . Leg pain, right 02/18/2011  . Rheumatoid arthritis ([place]) 02/04/2011  . Temporal arteritis ([place]) 09/01/2010  . Hypertension 09/01/2010  . Hyperlipidemia 09/01/2010  . History of atrial fibrillation 09/01/2010  . CAD (coronary artery disease) 09/01/2010  . GERD  (gastroesophageal reflux disease) 09/01/2010  . Insomnia 09/01/2010   Kerin Perna, PTA 07/05/20 3:59 PM  [place], [place], [address] Phone: [phone]   Fax:  [phone]  Name: RYELYNN GUEDEA MRN: [id number] Date of Birth: 1935/01/27

## 2020-07-05 NOTE — Telephone Encounter (Signed)
Hydrocodone refill sent

## 2020-07-08 DIAGNOSIS — M7071 Other bursitis of hip, right hip: Secondary | ICD-10-CM | POA: Diagnosis not present

## 2020-07-09 ENCOUNTER — Encounter: Payer: Self-pay | Admitting: Family Medicine

## 2020-07-09 NOTE — Telephone Encounter (Signed)
Please advise. I did not see any orders for this.

## 2020-07-10 ENCOUNTER — Other Ambulatory Visit: Payer: Self-pay

## 2020-07-10 ENCOUNTER — Ambulatory Visit (HOSPITAL_BASED_OUTPATIENT_CLINIC_OR_DEPARTMENT_OTHER): Payer: Medicare Other | Admitting: Physical Therapy

## 2020-07-10 DIAGNOSIS — M25551 Pain in right hip: Secondary | ICD-10-CM

## 2020-07-10 DIAGNOSIS — R2689 Other abnormalities of gait and mobility: Secondary | ICD-10-CM

## 2020-07-10 DIAGNOSIS — R293 Abnormal posture: Secondary | ICD-10-CM

## 2020-07-10 DIAGNOSIS — R262 Difficulty in walking, not elsewhere classified: Secondary | ICD-10-CM

## 2020-07-10 DIAGNOSIS — M6281 Muscle weakness (generalized): Secondary | ICD-10-CM

## 2020-07-10 NOTE — Therapy (Signed)
[facility] [address] [place], [place], [address] Phone: [phone]   Fax:  [phone]  Physical Therapy Treatment - No Charge  Patient Details  Name: Betty Jackson MRN: [id number] Date of Birth: [birth year] Referring Provider (PT): Altamese [place]field Glade, MD   Encounter Date: 07/10/2020   PT End of Session - 07/10/20 1701    Visit Number 19    Number of Visits 25    Date for PT Re-Evaluation 07/08/20    Authorization Type [employer] Medicare    Progress Note Due on Visit 20    PT Start Time 0950    PT Stop Time 1000    PT Time Calculation (min) 10 min    Activity Tolerance Patient tolerated treatment well    Behavior During Therapy [facility] for tasks assessed/performed           Past Medical History:  Diagnosis Date  . Acute respiratory failure with hypoxia ([place]) 05/20/2017  . Anemia, unspecified 10/28/2012  . Anxiety state, unspecified 10/28/2012  . CAD (coronary artery disease)    a. Stent RCA in [facility];  b. Cath approx 2009 - nonobs per pt report.  . Cataract    immature on the left eye  . Chronic insomnia 02/07/2013  . Chronic lower back pain    scoliosis  . CKD (chronic kidney disease) stage 3, GFR 30-59 ml/min (HCC)   . CVA (cerebral infarction) 10/29/2012  . DDD (degenerative disc disease)   . Depression   . Diverticulosis   . GERD (gastroesophageal reflux disease) 09/01/2010  . Hemorrhoids   . Herniated nucleus pulposus, L5-S1, right 11/04/2015  . Hyperlipidemia    takes Lipitor daily  . Hypertension    takes Amlodipine,Losartan,Metoprolol,and HCTZ daily  . Incontinence of urine   . Insomnia    takes Restoril nightly  . Lumbar stenosis 04/25/2013  . Major depressive disorder, recurrent episode, moderate ([place]) 07/16/2013  . Osteoporosis   . PAF (paroxysmal atrial fibrillation) ([place]) 2011   a. lone epidode in 2011 according to notes.  . Rheumatoid arthritis ([place])   . Scoliosis   . Sepsis ([place]) 05/2019  .  Temporal arteritis ([place]) 2011   a. followed @ [facility]; potential flareup 10/28/2012/notes 10/28/2012  . Vocal cord dysfunction    "they don't operate properly" (10/28/2012)    Past Surgical History:  Procedure Laterality Date  . ABDOMINAL HYSTERECTOMY  ~ 1984   vaginally  . BACK SURGERY  7-44yrs ago   X Stop  . BLADDER SUSPENSION  2001  . BREAST BIOPSY Right   . CATARACT EXTRACTION W/ INTRAOCULAR LENS IMPLANT Right ~ 08/2012  . CHEST TUBE INSERTION Left 03/08/2019   Procedure: Chest Tube Insertion;  Surgeon: Ivin Poot, MD;  Location: [place];  Service: Thoracic;  Laterality: Left;  . COLONOSCOPY  01/26/2012   Procedure: COLONOSCOPY;  Surgeon: Ladene Artist, MD,FACG;  Location: [facility] ENDOSCOPY;  Service: Endoscopy;  Laterality: N/A;  note the EGD is possible  . CORONARY ANGIOPLASTY WITH STENT PLACEMENT  2006   X 1 stent  . EPIDURAL BLOCK INJECTION    . ESOPHAGOGASTRODUODENOSCOPY  01/26/2012   Procedure: ESOPHAGOGASTRODUODENOSCOPY (EGD);  Surgeon: Ladene Artist, MD,FACG;  Location: [facility] ENDOSCOPY;  Service: Endoscopy;  Laterality: N/A;  . FINE NEEDLE ASPIRATION Right 09/26/2019   Procedure: FINE NEEDLE ASPIRATION;  Surgeon: Altamese [place], MD;  Location: [place];  Service: Orthopedics;  Laterality: Right;  . HEMIARTHROPLASTY HIP Right 2012  . IR EPIDUROGRAPHY  04/20/2019  .  LAPAROSCOPIC CHOLECYSTECTOMY  2001  . LUMBAR FUSION  03/2013  . LUMBAR LAMINECTOMY/DECOMPRESSION MICRODISCECTOMY Right 11/04/2015   Procedure: Right Lumbar Five-Sacral One Microdiskectomy;  Surgeon: Kristeen Miss, MD;  Location: [place] NEURO ORS;  Service: Neurosurgery;  Laterality: Right;  Right L5-S1 Microdiskectomy  . moles removed that required stiches     one on leg and one on face  . ORIF FEMUR FRACTURE Right 09/26/2019   Procedure: OPEN REDUCTION INTERNAL FIXATION (ORIF) DISTAL FEMUR FRACTURE;  Surgeon: Altamese [place], MD;  Location: [facility];  Service: Orthopedics;  Laterality: Right;  . ORIF FEMUR FRACTURE Right 10/17/2019    Procedure: OPEN REDUCTION INTERNAL FIXATION (ORIF) DISTAL FEMUR FRACTURE;  Surgeon: Altamese [place], MD;  Location: [place];  Service: Orthopedics;  Laterality: Right;  . SUBXYPHOID PERICARDIAL WINDOW N/A 03/08/2019   Procedure: SUBXYPHOID PERICARDIAL WINDOW;  Surgeon: Ivin Poot, MD;  Location: [place];  Service: Thoracic;  Laterality: N/A;  . TEE WITHOUT CARDIOVERSION N/A 03/08/2019   Procedure: TRANSESOPHAGEAL ECHOCARDIOGRAM (TEE);  Surgeon: Prescott Gum, Collier [place]ina, MD;  Location: [place];  Service: Thoracic;  Laterality: N/A;  . TEMPORAL ARTERY BIOPSY / LIGATION Bilateral 2011  . TONSILLECTOMY AND ADENOIDECTOMY     at age 32  . TUBAL LIGATION  ~ 1982  . X-STOP IMPLANTATION  ~ 2010   "lower back" (10/28/2012)    There were no vitals filed for this visit.   Subjective Assessment - 07/10/20 1659    Subjective No charge visit. Pt's daughter arrives 20 minutes late due to confusion of pt's appointment time. Daughter and pt report that she is feeling better after shot into her bursa. Asking about additional visits since she has had a set back with her ischial bursitis. Discussed that it would be better to reschedule today's visit since her visit time is shortened because of late arrival.    Patient is accompained by: Family member   daughter   Pertinent History CAD, hypertension, history of temporal arteritis, atrial fibrillation, GERD, chronic back pain with multiple prior surgeries, osteoarthritis involving multiple joints    Limitations Standing    How long can you sit comfortably? N/a    How long can you stand comfortably? Not long    How long can you walk comfortably? 17' with RW (PLOF)    Patient Stated Goals Improve mobility and strength                                       PT Short Term Goals - 06/10/20 1157      PT SHORT TERM GOAL #1   Title Pt will be independent with HEP    Time 4    Period Weeks    Status Achieved    Target Date 06/10/20      PT SHORT  TERM GOAL #2   Title Pt will be able to perform 5x STS in <15 sec to demo improved functional strength    Baseline 19 sec; 15 sec 06/10/20    Time 4    Period Weeks    Status Achieved    Target Date 06/10/20      PT SHORT TERM GOAL #3   Title Pt will be able to decrease TUG score by at least 10 sec to demo MCID    Baseline 40 sec; 28 sec on 06/10/20    Time 4    Period Weeks    Status Achieved  Target Date 06/10/20      PT SHORT TERM GOAL #4   Title Pt will be fully independent with transfers    Baseline Requires min A at times    Time 4    Period Weeks    Status Achieved    Target Date 06/10/20             PT Long Term Goals - 05/13/20 1411      PT LONG TERM GOAL #1   Title Pt will be independent with maintaining mobility at home    Time 8    Period Weeks    Status New    Target Date 07/08/20      PT LONG TERM GOAL #2   Title Pt will have improved 5 x STS to </=13 sec to demo decreased fall risk    Baseline 19 sec    Time 8    Period Weeks    Status New    Target Date 07/08/20      PT LONG TERM GOAL #3   Title Pt will have improved TUG score to </=20 sec    Baseline 40 sec    Time 8    Period Weeks    Status New    Target Date 07/08/20      PT LONG TERM GOAL #4   Title Pt will have improved Berg Score to at least 37/56 to demo [place] for decreased fall risk    Baseline 29/56    Time 8    Period Weeks    Status New    Target Date 07/08/20                 Plan - 07/10/20 1658    Personal Factors and Comorbidities Age;Fitness;Comorbidity 1;Comorbidity 2;Comorbidity 3+;Time since onset of injury/illness/exacerbation    Comorbidities CAD, hypertension, history of temporal arteritis, atrial fibrillation, GERD, chronic back pain with multiple prior surgeries, osteoarthritis involving multiple joints    Examination-Activity Limitations Bed Mobility;Bend;Locomotion Level;Transfers;Dressing;Stand;Squat    Examination-Participation Restrictions Community  Activity;Cleaning;Shop;Laundry    Stability/Clinical Decision Making Evolving/Moderate complexity    Rehab Potential Good    PT Frequency 3x / week   2x/wk on land; 1x/wk aquatics   PT Duration 8 weeks    PT Treatment/Interventions Aquatic Therapy;ADLs/Self Care Home Management;Cryotherapy;Electrical Stimulation;Biofeedback;Moist Heat;DME Instruction;Gait training;Stair training;Functional mobility training;Therapeutic activities;Therapeutic exercise;Balance training;Neuromuscular re-education;Manual techniques;Patient/family education;Orthotic Fit/Training;Dry needling;Taping;Vasopneumatic Device    PT Next Visit Plan Land: Continue to work on glute/hip extensor and quad strengthening. Manual therapy & strengthening as needed for L ankle DF and R posterior hip/glute. Work on endurance in upright posture and gait.  20th VISIT NOTE. (end of POC on 4/11)    PT Home Exercise Plan Access Code: [email or web address]; pt to work on standing for a total of at least 10 minutes in her day (can be split 2x5 min sessions or 5x2 min sessions)    Consulted and Agree with Plan of Care Patient;Family member/caregiver    Family Member Consulted Caregiver           Patient will benefit from skilled therapeutic intervention in order to improve the following deficits and impairments:  Abnormal gait,Decreased range of motion,Difficulty walking,Decreased endurance,Decreased activity tolerance,Pain,Decreased balance,Hypomobility,Improper body mechanics,Decreased mobility,Decreased strength,Postural dysfunction  Visit Diagnosis: Muscle weakness (generalized)  Abnormal posture  Pain in right hip  Difficulty in walking, not elsewhere classified  Other abnormalities of gait and mobility     Problem List Patient Active Problem List   Diagnosis Date Noted  .  Cough 05/24/2020  . Dyspnea 05/24/2020  . Optic neuritis 05/24/2020  . Osteopenia 05/24/2020  . Rash 05/24/2020  . Vitamin D deficiency 05/24/2020  . Palliative  care by specialist   . Goals of care, counseling/discussion   . DNR (do not resuscitate)   . Weakness   . Advanced directives, counseling/discussion   . Pressure injury of skin 10/18/2019  . Closed fracture of right distal femur ([place]) 10/17/2019  . Femur fracture, right ([place]) 09/25/2019  . Chronic diastolic CHF (congestive heart failure) ([place]) 09/25/2019  . HCAP (healthcare-associated pneumonia) 06/19/2019  . Encephalopathy 06/19/2019  . Lumbar degenerative disc disease   . Paroxysmal A-fib ([place])   . Hypomagnesemia   . C. difficile colitis   . Diarrhea 05/29/2019  . CHF (congestive heart failure) ([place]) 05/29/2019  . Recurrent falls 04/30/2019  . Pericardial effusion without cardiac tamponade 03/29/2019  . Hypoalbuminemia 03/27/2019  . Cardiac tamponade   . Pericarditis 03/06/2019  . Fever   . Septic shock ([place])   . Atypical chest pain   . Atrial fibrillation with rapid ventricular response ([place])   . Nausea 01/17/2019  . Acute diverticulitis 01/13/2019  . Chronic back pain 01/13/2019  . Tachycardia 01/13/2019  . Hypokalemia 01/13/2019  . Chronic low back pain 07/10/2018  . Influenza A 05/20/2017  . Severe sepsis ([place]) 05/20/2017  . Acute respiratory failure with hypoxia ([place]) 05/20/2017  . CKD (chronic kidney disease), stage III ([place]) 05/20/2017  . PAF (paroxysmal atrial fibrillation) ([place]) 05/20/2017  . HTN (hypertension) 05/20/2017  . Giant cell arteritis ([place]) 05/20/2017  . Coronary disease 05/20/2017  . Abnormal urinalysis 05/20/2017  . Osteoporosis 05/08/2016  . Herniated nucleus pulposus, L5-S1, right 11/04/2015  . Major depressive disorder, recurrent episode, moderate ([place]) 07/16/2013  . Abdominal pain, epigastric 06/22/2013  . Lumbar stenosis 04/25/2013  . Chronic insomnia 02/07/2013  . Elevated transaminase level 02/07/2013  . CVA (cerebral infarction) 10/29/2012  . Visual changes 10/28/2012  . Depression 10/28/2012  . Anxiety state 10/28/2012  . Anemia,  unspecified 10/28/2012  . Nonspecific (abnormal) findings on radiological and other examination of gastrointestinal tract 01/25/2012  . Hyponatremia 01/24/2012  . Hematuria 01/24/2012  . Enteritis 12/24/2011  . Abdominal pain, other specified site 12/24/2011  . Leg pain, right 02/18/2011  . Rheumatoid arthritis ([place]) 02/04/2011  . Temporal arteritis ([place]) 09/01/2010  . Hypertension 09/01/2010  . Hyperlipidemia 09/01/2010  . History of atrial fibrillation 09/01/2010  . CAD (coronary artery disease) 09/01/2010  . GERD (gastroesophageal reflux disease) 09/01/2010  . Insomnia 09/01/2010    Brennan Karam April Ma L Yamilett Anastos PT, DPT 07/10/2020, 5:02 PM  [facility] [place], [place], [address] Phone: [phone]   Fax:  [phone]  Name: Betty Jackson MRN: [id number] Date of Birth: 11/22/1934

## 2020-07-11 ENCOUNTER — Ambulatory Visit (HOSPITAL_BASED_OUTPATIENT_CLINIC_OR_DEPARTMENT_OTHER): Payer: Medicare Other | Admitting: Physical Therapy

## 2020-07-11 DIAGNOSIS — M25551 Pain in right hip: Secondary | ICD-10-CM

## 2020-07-11 DIAGNOSIS — R2689 Other abnormalities of gait and mobility: Secondary | ICD-10-CM

## 2020-07-11 DIAGNOSIS — R262 Difficulty in walking, not elsewhere classified: Secondary | ICD-10-CM

## 2020-07-11 DIAGNOSIS — M6281 Muscle weakness (generalized): Secondary | ICD-10-CM | POA: Diagnosis not present

## 2020-07-11 DIAGNOSIS — R293 Abnormal posture: Secondary | ICD-10-CM | POA: Diagnosis not present

## 2020-07-11 NOTE — Therapy (Signed)
[place], [place], [address] Phone: [phone]   Fax:  [phone]  Physical Therapy Treatment, Progress Note, and Re-Certification  Patient Details  Name: Betty Jackson MRN: [id number] Date of Birth: 1934/12/21 Referring Provider (PT): Altamese [place], MD  Progress Note Reporting Period 05/13/20 to 07/11/2020  See note below for Objective Data and Assessment of Progress/Goals.        Encounter Date: 07/11/2020   PT End of Session - 07/11/20 1530    Visit Number 20    Number of Visits 25    Date for PT Re-Evaluation 07/08/20    Authorization Type [employer] Medicare    Progress Note Due on Visit 20    PT Start Time 1100    PT Stop Time 1145    PT Time Calculation (min) 45 min    Activity Tolerance Patient tolerated treatment well    Behavior During Therapy WFL for tasks assessed/performed           Past Medical History:  Diagnosis Date  . Acute respiratory failure with hypoxia ([place]) 05/20/2017  . Anemia, unspecified 10/28/2012  . Anxiety state, unspecified 10/28/2012  . CAD (coronary artery disease)    a. Stent RCA in [facility];  b. Cath approx 2009 - nonobs per pt report.  . Cataract    immature on the left eye  . Chronic insomnia 02/07/2013  . Chronic lower back pain    scoliosis  . CKD (chronic kidney disease) stage 3, GFR 30-59 ml/min (HCC)   . CVA (cerebral infarction) 10/29/2012  . DDD (degenerative disc disease)   . Depression   . Diverticulosis   . GERD (gastroesophageal reflux disease) 09/01/2010  . Hemorrhoids   . Herniated nucleus pulposus, L5-S1, right 11/04/2015  . Hyperlipidemia    takes Lipitor daily  . Hypertension    takes Amlodipine,Losartan,Metoprolol,and HCTZ daily  . Incontinence of urine   . Insomnia    takes Restoril nightly  . Lumbar stenosis 04/25/2013  . Major depressive disorder, recurrent episode, moderate ([place]) 07/16/2013  . Osteoporosis   . PAF (paroxysmal  atrial fibrillation) ([place]) 2011   a. lone epidode in 2011 according to notes.  . Rheumatoid arthritis ([place])   . Scoliosis   . Sepsis ([place]) 05/2019  . Temporal arteritis ([place]) 2011   a. followed @ [facility]; potential flareup 10/28/2012/notes 10/28/2012  . Vocal cord dysfunction    "they don't operate properly" (10/28/2012)    Past Surgical History:  Procedure Laterality Date  . ABDOMINAL HYSTERECTOMY  ~ 1984   vaginally  . BACK SURGERY  7-72yrs ago   X Stop  . BLADDER SUSPENSION  2001  . BREAST BIOPSY Right   . CATARACT EXTRACTION W/ INTRAOCULAR LENS IMPLANT Right ~ 08/2012  . CHEST TUBE INSERTION Left 03/08/2019   Procedure: Chest Tube Insertion;  Surgeon: Ivin Poot, MD;  Location: [place];  Service: Thoracic;  Laterality: Left;  . COLONOSCOPY  01/26/2012   Procedure: COLONOSCOPY;  Surgeon: Ladene Artist, MD,FACG;  Location: [facility] ENDOSCOPY;  Service: Endoscopy;  Laterality: N/A;  note the EGD is possible  . CORONARY ANGIOPLASTY WITH STENT PLACEMENT  2006   X 1 stent  . EPIDURAL BLOCK INJECTION    . ESOPHAGOGASTRODUODENOSCOPY  01/26/2012   Procedure: ESOPHAGOGASTRODUODENOSCOPY (EGD);  Surgeon: Ladene Artist, MD,FACG;  Location: [facility] ENDOSCOPY;  Service: Endoscopy;  Laterality: N/A;  . FINE NEEDLE ASPIRATION Right 09/26/2019   Procedure: FINE NEEDLE ASPIRATION;  Surgeon: Altamese [place],  MD;  Location: [place];  Service: Orthopedics;  Laterality: Right;  . HEMIARTHROPLASTY HIP Right 2012  . IR EPIDUROGRAPHY  04/20/2019  . LAPAROSCOPIC CHOLECYSTECTOMY  2001  . LUMBAR FUSION  03/2013  . LUMBAR LAMINECTOMY/DECOMPRESSION MICRODISCECTOMY Right 11/04/2015   Procedure: Right Lumbar Five-Sacral One Microdiskectomy;  Surgeon: Kristeen Miss, MD;  Location: [place] NEURO ORS;  Service: Neurosurgery;  Laterality: Right;  Right L5-S1 Microdiskectomy  . moles removed that required stiches     one on leg and one on face  . ORIF FEMUR FRACTURE Right 09/26/2019   Procedure: OPEN REDUCTION INTERNAL FIXATION (ORIF)  DISTAL FEMUR FRACTURE;  Surgeon: Altamese [place], MD;  Location: [place];  Service: Orthopedics;  Laterality: Right;  . ORIF FEMUR FRACTURE Right 10/17/2019   Procedure: OPEN REDUCTION INTERNAL FIXATION (ORIF) DISTAL FEMUR FRACTURE;  Surgeon: Altamese [place], MD;  Location: [place];  Service: Orthopedics;  Laterality: Right;  . SUBXYPHOID PERICARDIAL WINDOW N/A 03/08/2019   Procedure: SUBXYPHOID PERICARDIAL WINDOW;  Surgeon: Ivin Poot, MD;  Location: [place];  Service: Thoracic;  Laterality: N/A;  . TEE WITHOUT CARDIOVERSION N/A 03/08/2019   Procedure: TRANSESOPHAGEAL ECHOCARDIOGRAM (TEE);  Surgeon: Prescott Gum, Collier [place], MD;  Location: [place];  Service: Thoracic;  Laterality: N/A;  . TEMPORAL ARTERY BIOPSY / LIGATION Bilateral 2011  . TONSILLECTOMY AND ADENOIDECTOMY     at age 72  . TUBAL LIGATION  ~ 1982  . X-STOP IMPLANTATION  ~ 2010   "lower back" (10/28/2012)    There were no vitals filed for this visit.   Subjective Assessment - 07/11/20 1058    Subjective Pt reports continued good relief from her shot.    Patient is accompained by: Family member   daughter   Pertinent History CAD, hypertension, history of temporal arteritis, atrial fibrillation, GERD, chronic back pain with multiple prior surgeries, osteoarthritis involving multiple joints    Limitations Standing    How long can you sit comfortably? N/a    How long can you stand comfortably? Not long    How long can you walk comfortably? 57' with RW (PLOF)    Patient Stated Goals Improve mobility and strength    Currently in Pain? Yes    Pain Score 2     Pain Location Buttocks    Pain Orientation Right    Pain Descriptors / Indicators Aching    Pain Type Chronic pain    Pain Onset More than a month ago                             [facility] Adult PT Treatment/Exercise - 07/11/20 0001      Knee/Hip Exercises: Aerobic   Nustep L5 x 5 min      Knee/Hip Exercises: Machines for Strengthening   Other Machine Shuttle DL 75#  2x10; SL 37# 2x10      Knee/Hip Exercises: Standing   Heel Raises Both;2 sets;10 reps    SLS with Vectors Tap forward & to the side x10 bilat    Other Standing Knee Exercises Weightshifts L<>R x10      Knee/Hip Exercises: Seated   Other Seated Knee/Hip Exercises Ankle dorsiflexion 2x10 eccentric on L      Knee/Hip Exercises: Prone   Other Prone Exercises Knee flexion AROM                    PT Short Term Goals - 06/10/20 1157      PT SHORT TERM  GOAL #1   Title Pt will be independent with HEP    Time 4    Period Weeks    Status Achieved    Target Date 06/10/20      PT SHORT TERM GOAL #2   Title Pt will be able to perform 5x STS in <15 sec to demo improved functional strength    Baseline 19 sec; 15 sec 06/10/20    Time 4    Period Weeks    Status Achieved    Target Date 06/10/20      PT SHORT TERM GOAL #3   Title Pt will be able to decrease TUG score by at least 10 sec to demo MCID    Baseline 40 sec; 28 sec on 06/10/20    Time 4    Period Weeks    Status Achieved    Target Date 06/10/20      PT SHORT TERM GOAL #4   Title Pt will be fully independent with transfers    Baseline Requires min A at times    Time 4    Period Weeks    Status Achieved    Target Date 06/10/20             PT Long Term Goals - 07/11/20 1153      PT LONG TERM GOAL #1   Title Pt will be independent with maintaining mobility at home    Time 8    Period Weeks    Status On-going      PT LONG TERM GOAL #2   Title Pt will have improved 5 x STS to </=13 sec to demo decreased fall risk    Baseline 19 sec    Time 8    Period Weeks    Status On-going    Target Date 09/05/20      PT LONG TERM GOAL #3   Title Pt will have improved TUG score to </=20 sec    Baseline 40 sec    Time 8    Period Weeks    Status On-going    Target Date 09/05/20      PT LONG TERM GOAL #4   Title Pt will have improved Berg Score to at least 37/56 to demo [place] for decreased fall risk    Baseline  29/56    Time 8    Period Weeks    Status On-going    Target Date 09/05/20                 Plan - 07/11/20 1151    Clinical Impression Statement Able to re-initiate standing exercises. Pt with improved pain after her shot. Able to continue to work on knee stability and hip abductor strengthening. Pt with difficulty performing sidelying clamshell. Due to her ischial bursitis, pt unable to meet current LTGs. Pt would benefit from additional visits of PT to continue to work on her [place].    Personal Factors and Comorbidities Age;Fitness;Comorbidity 1;Comorbidity 2;Comorbidity 3+;Time since onset of injury/illness/exacerbation    Comorbidities CAD, hypertension, history of temporal arteritis, atrial fibrillation, GERD, chronic back pain with multiple prior surgeries, osteoarthritis involving multiple joints    Examination-Activity Limitations Bed Mobility;Bend;Locomotion Level;Transfers;Dressing;Stand;Squat    Examination-Participation Restrictions Community Activity;Cleaning;Shop;Laundry    Stability/Clinical Decision Making Evolving/Moderate complexity    Rehab Potential Good    PT Frequency 3x / week   2x/wk on land; 1x/wk aquatics   PT Duration 8 weeks    PT Treatment/Interventions Aquatic Therapy;ADLs/Self Care Home Management;Cryotherapy;Electrical Stimulation;Biofeedback;Moist  Heat;DME Instruction;Gait training;Stair training;Functional mobility training;Therapeutic activities;Therapeutic exercise;Balance training;Neuromuscular re-education;Manual techniques;Patient/family education;Orthotic Fit/Training;Dry needling;Taping;Vasopneumatic Device    PT Next Visit Plan Land: Continue to work on hip and quad strengthening. Manual therapy & strengthening as needed for L ankle DF. Work on endurance in upright posture and gait.  20th VISIT NOTE. (end of POC on 4/11)    PT Home Exercise Plan Access Code: [email or web address]; pt to work on standing for a total of at least 10 minutes in her day (can be split  2x5 min sessions or 5x2 min sessions)    Consulted and Agree with Plan of Care Patient;Family member/caregiver    Family Member Consulted Caregiver           Patient will benefit from skilled therapeutic intervention in order to improve the following deficits and impairments:  Abnormal gait,Decreased range of motion,Difficulty walking,Decreased endurance,Decreased activity tolerance,Pain,Decreased balance,Hypomobility,Improper body mechanics,Decreased mobility,Decreased strength,Postural dysfunction  Visit Diagnosis: Muscle weakness (generalized)  Abnormal posture  Pain in right hip  Difficulty in walking, not elsewhere classified  Other abnormalities of gait and mobility     Problem List Patient Active Problem List   Diagnosis Date Noted  . Cough 05/24/2020  . Dyspnea 05/24/2020  . Optic neuritis 05/24/2020  . Osteopenia 05/24/2020  . Rash 05/24/2020  . Vitamin D deficiency 05/24/2020  . Palliative care by specialist   . Goals of care, counseling/discussion   . DNR (do not resuscitate)   . Weakness   . Advanced directives, counseling/discussion   . Pressure injury of skin 10/18/2019  . Closed fracture of right distal femur ([place]) 10/17/2019  . Femur fracture, right ([place]) 09/25/2019  . Chronic diastolic CHF (congestive heart failure) ([place]) 09/25/2019  . HCAP (healthcare-associated pneumonia) 06/19/2019  . Encephalopathy 06/19/2019  . Lumbar degenerative disc disease   . Paroxysmal A-fib ([place])   . Hypomagnesemia   . C. difficile colitis   . Diarrhea 05/29/2019  . CHF (congestive heart failure) ([place]) 05/29/2019  . Recurrent falls 04/30/2019  . Pericardial effusion without cardiac tamponade 03/29/2019  . Hypoalbuminemia 03/27/2019  . Cardiac tamponade   . Pericarditis 03/06/2019  . Fever   . Septic shock ([place])   . Atypical chest pain   . Atrial fibrillation with rapid ventricular response ([place])   . Nausea 01/17/2019  . Acute diverticulitis 01/13/2019  .  Chronic back pain 01/13/2019  . Tachycardia 01/13/2019  . Hypokalemia 01/13/2019  . Chronic low back pain 07/10/2018  . Influenza A 05/20/2017  . Severe sepsis ([place]) 05/20/2017  . Acute respiratory failure with hypoxia ([place]) 05/20/2017  . CKD (chronic kidney disease), stage III ([place]) 05/20/2017  . PAF (paroxysmal atrial fibrillation) ([place]) 05/20/2017  . HTN (hypertension) 05/20/2017  . Giant cell arteritis ([place]) 05/20/2017  . Coronary disease 05/20/2017  . Abnormal urinalysis 05/20/2017  . Osteoporosis 05/08/2016  . Herniated nucleus pulposus, L5-S1, right 11/04/2015  . Major depressive disorder, recurrent episode, moderate ([place]) 07/16/2013  . Abdominal pain, epigastric 06/22/2013  . Lumbar stenosis 04/25/2013  . Chronic insomnia 02/07/2013  . Elevated transaminase level 02/07/2013  . CVA (cerebral infarction) 10/29/2012  . Visual changes 10/28/2012  . Depression 10/28/2012  . Anxiety state 10/28/2012  . Anemia, unspecified 10/28/2012  . Nonspecific (abnormal) findings on radiological and other examination of gastrointestinal tract 01/25/2012  . Hyponatremia 01/24/2012  . Hematuria 01/24/2012  . Enteritis 12/24/2011  . Abdominal pain, other specified site 12/24/2011  . Leg pain, right 02/18/2011  . Rheumatoid arthritis ([place]) 02/04/2011  . Temporal arteritis ([place]) 09/01/2010  .  Hypertension 09/01/2010  . Hyperlipidemia 09/01/2010  . History of atrial fibrillation 09/01/2010  . CAD (coronary artery disease) 09/01/2010  . GERD (gastroesophageal reflux disease) 09/01/2010  . Insomnia 09/01/2010    Maribel Luis April Ma L Sian Joles PT, DPT 07/11/2020, 4:31 PM  [facility] [place], [place], [address] Phone: [phone]   Fax:  [phone]  Name: Betty Jackson MRN: [id number] Date of Birth: 1934/12/11

## 2020-07-14 ENCOUNTER — Observation Stay (HOSPITAL_COMMUNITY)
Admission: EM | Admit: 2020-07-14 | Discharge: 2020-07-14 | Disposition: A | Discharge status: Home / Self Care | Payer: Medicare Other | Attending: Internal Medicine | Admitting: Internal Medicine

## 2020-07-14 ENCOUNTER — Other Ambulatory Visit: Payer: Self-pay

## 2020-07-14 ENCOUNTER — Emergency Department (HOSPITAL_COMMUNITY): Payer: Medicare Other

## 2020-07-14 ENCOUNTER — Observation Stay (HOSPITAL_COMMUNITY): Payer: Medicare Other

## 2020-07-14 ENCOUNTER — Encounter (HOSPITAL_COMMUNITY): Payer: Self-pay | Admitting: Internal Medicine

## 2020-07-14 DIAGNOSIS — R4781 Slurred speech: Secondary | ICD-10-CM | POA: Diagnosis not present

## 2020-07-14 DIAGNOSIS — Z20822 Contact with and (suspected) exposure to covid-19: Secondary | ICD-10-CM | POA: Diagnosis not present

## 2020-07-14 DIAGNOSIS — I2510 Atherosclerotic heart disease of native coronary artery without angina pectoris: Secondary | ICD-10-CM | POA: Insufficient documentation

## 2020-07-14 DIAGNOSIS — I5032 Chronic diastolic (congestive) heart failure: Secondary | ICD-10-CM | POA: Diagnosis not present

## 2020-07-14 DIAGNOSIS — I130 Hypertensive heart and chronic kidney disease with heart failure and stage 1 through stage 4 chronic kidney disease, or unspecified chronic kidney disease: Secondary | ICD-10-CM | POA: Diagnosis not present

## 2020-07-14 DIAGNOSIS — M069 Rheumatoid arthritis, unspecified: Secondary | ICD-10-CM | POA: Diagnosis present

## 2020-07-14 DIAGNOSIS — I63432 Cerebral infarction due to embolism of left posterior cerebral artery: Secondary | ICD-10-CM | POA: Diagnosis not present

## 2020-07-14 DIAGNOSIS — Z66 Do not resuscitate: Secondary | ICD-10-CM | POA: Diagnosis not present

## 2020-07-14 DIAGNOSIS — N183 Chronic kidney disease, stage 3 unspecified: Secondary | ICD-10-CM | POA: Insufficient documentation

## 2020-07-14 DIAGNOSIS — R29810 Facial weakness: Secondary | ICD-10-CM | POA: Diagnosis not present

## 2020-07-14 DIAGNOSIS — Z743 Need for continuous supervision: Secondary | ICD-10-CM | POA: Diagnosis not present

## 2020-07-14 DIAGNOSIS — Z7901 Long term (current) use of anticoagulants: Secondary | ICD-10-CM | POA: Insufficient documentation

## 2020-07-14 DIAGNOSIS — E785 Hyperlipidemia, unspecified: Secondary | ICD-10-CM | POA: Diagnosis present

## 2020-07-14 DIAGNOSIS — R6889 Other general symptoms and signs: Secondary | ICD-10-CM | POA: Diagnosis not present

## 2020-07-14 DIAGNOSIS — R2990 Unspecified symptoms and signs involving the nervous system: Secondary | ICD-10-CM | POA: Diagnosis not present

## 2020-07-14 DIAGNOSIS — I63233 Cerebral infarction due to unspecified occlusion or stenosis of bilateral carotid arteries: Secondary | ICD-10-CM | POA: Diagnosis not present

## 2020-07-14 DIAGNOSIS — R0902 Hypoxemia: Secondary | ICD-10-CM | POA: Diagnosis not present

## 2020-07-14 DIAGNOSIS — R410 Disorientation, unspecified: Secondary | ICD-10-CM | POA: Diagnosis present

## 2020-07-14 DIAGNOSIS — Z79899 Other long term (current) drug therapy: Secondary | ICD-10-CM | POA: Insufficient documentation

## 2020-07-14 DIAGNOSIS — I10 Essential (primary) hypertension: Secondary | ICD-10-CM | POA: Diagnosis present

## 2020-07-14 DIAGNOSIS — G459 Transient cerebral ischemic attack, unspecified: Secondary | ICD-10-CM | POA: Diagnosis not present

## 2020-07-14 DIAGNOSIS — I639 Cerebral infarction, unspecified: Secondary | ICD-10-CM | POA: Diagnosis not present

## 2020-07-14 DIAGNOSIS — G319 Degenerative disease of nervous system, unspecified: Secondary | ICD-10-CM | POA: Diagnosis not present

## 2020-07-14 DIAGNOSIS — I480 Paroxysmal atrial fibrillation: Secondary | ICD-10-CM | POA: Diagnosis present

## 2020-07-14 DIAGNOSIS — F331 Major depressive disorder, recurrent, moderate: Secondary | ICD-10-CM | POA: Diagnosis present

## 2020-07-14 LAB — I-STAT CHEM 8, ED
BUN: 25 mg/dL — ABNORMAL HIGH (ref 8–23)
Calcium, Ion: 1.18 mmol/L (ref 1.15–1.40)
Chloride: 98 mmol/L (ref 98–111)
Creatinine, Ser: 1.1 mg/dL — ABNORMAL HIGH (ref 0.44–1.00)
Glucose, Bld: 107 mg/dL — ABNORMAL HIGH (ref 70–99)
HCT: 44 % (ref 36.0–46.0)
Hemoglobin: 15 g/dL (ref 12.0–15.0)
Potassium: 4.2 mmol/L (ref 3.5–5.1)
Sodium: 139 mmol/L (ref 135–145)
TCO2: 32 mmol/L (ref 22–32)

## 2020-07-14 LAB — COMPREHENSIVE METABOLIC PANEL
ALT: 28 U/L (ref 0–44)
AST: 24 U/L (ref 15–41)
Albumin: 3.8 g/dL (ref 3.5–5.0)
Alkaline Phosphatase: 58 U/L (ref 38–126)
Anion gap: 9 (ref 5–15)
BUN: 21 mg/dL (ref 8–23)
CO2: 31 mmol/L (ref 22–32)
Calcium: 9.1 mg/dL (ref 8.9–10.3)
Chloride: 96 mmol/L — ABNORMAL LOW (ref 98–111)
Creatinine, Ser: 0.99 mg/dL (ref 0.44–1.00)
GFR, Estimated: 56 mL/min — ABNORMAL LOW (ref >60)
Glucose, Bld: 108 mg/dL — ABNORMAL HIGH (ref 70–99)
Potassium: 4.1 mmol/L (ref 3.5–5.1)
Sodium: 136 mmol/L (ref 135–145)
Total Bilirubin: 0.7 mg/dL (ref 0.3–1.2)
Total Protein: 6.6 g/dL (ref 6.5–8.1)

## 2020-07-14 LAB — CBC
HCT: 44.1 % (ref 36.0–46.0)
Hemoglobin: 14.2 g/dL (ref 12.0–15.0)
MCH: 30.7 pg (ref 26.0–34.0)
MCHC: 32.2 g/dL (ref 30.0–36.0)
MCV: 95.5 fL (ref 80.0–100.0)
Platelets: 251 K/uL (ref 150–400)
RBC: 4.62 MIL/uL (ref 3.87–5.11)
RDW: 13.9 % (ref 11.5–15.5)
WBC: 18.9 K/uL — ABNORMAL HIGH (ref 4.0–10.5)
nRBC: 0 % (ref 0.0–0.2)

## 2020-07-14 LAB — DIFFERENTIAL
Abs Immature Granulocytes: 0.29 K/uL — ABNORMAL HIGH (ref 0.00–0.07)
Basophils Absolute: 0.1 K/uL (ref 0.0–0.1)
Basophils Relative: 0 %
Eosinophils Absolute: 0 K/uL (ref 0.0–0.5)
Eosinophils Relative: 0 %
Immature Granulocytes: 2 %
Lymphocytes Relative: 12 %
Lymphs Abs: 2.3 K/uL (ref 0.7–4.0)
Monocytes Absolute: 1.3 K/uL — ABNORMAL HIGH (ref 0.1–1.0)
Monocytes Relative: 7 %
Neutro Abs: 15 K/uL — ABNORMAL HIGH (ref 1.7–7.7)
Neutrophils Relative %: 79 %

## 2020-07-14 LAB — CBG MONITORING, ED: Glucose-Capillary: 114 mg/dL — ABNORMAL HIGH (ref 70–99)

## 2020-07-14 LAB — PROTIME-INR
INR: 1.1 (ref 0.8–1.2)
Prothrombin Time: 14.1 s (ref 11.4–15.2)

## 2020-07-14 LAB — RESP PANEL BY RT-PCR (FLU A&B, COVID) ARPGX2
Influenza A by PCR: NEGATIVE
Influenza B by PCR: NEGATIVE
SARS Coronavirus 2 by RT PCR: NEGATIVE

## 2020-07-14 LAB — APTT: aPTT: 28 s (ref 24–36)

## 2020-07-14 MED ORDER — ACETAMINOPHEN 650 MG RE SUPP: 650.0000 mg | RECTAL | Status: DC | PRN

## 2020-07-14 MED ORDER — FENTANYL 25 MCG/HR TD PT72: 1.0000 | MEDICATED_PATCH | TRANSDERMAL | Status: DC

## 2020-07-14 MED ORDER — PREDNISONE 20 MG PO TABS: 10.0000 mg | ORAL_TABLET | Freq: Every day | ORAL | Status: DC

## 2020-07-14 MED ORDER — SODIUM CHLORIDE 0.9 % IV SOLN: INTRAVENOUS | Status: DC

## 2020-07-14 MED ORDER — ATORVASTATIN CALCIUM 40 MG PO TABS: 40.0000 mg | ORAL_TABLET | Freq: Every day | ORAL | Status: DC

## 2020-07-14 MED ORDER — APIXABAN 5 MG PO TABS: 5.0000 mg | ORAL_TABLET | Freq: Two times a day (BID) | ORAL | Status: DC

## 2020-07-14 MED ORDER — STROKE: EARLY STAGES OF RECOVERY BOOK: Freq: Once | Status: DC

## 2020-07-14 MED ORDER — IOHEXOL 350 MG/ML SOLN
75.0000 mL | Freq: Once | INTRAVENOUS | Status: AC | PRN
  Administered 2020-07-14: 75 mL via INTRAVENOUS

## 2020-07-14 MED ORDER — HYDROCODONE-ACETAMINOPHEN 10-325 MG PO TABS
1.0000 | ORAL_TABLET | Freq: Three times a day (TID) | ORAL | Status: DC | PRN
Start: 2020-07-14 — End: 2020-07-15

## 2020-07-14 MED ORDER — ACETAMINOPHEN 325 MG PO TABS: 650.0000 mg | ORAL_TABLET | ORAL | Status: DC | PRN

## 2020-07-14 MED ORDER — ACETAMINOPHEN 160 MG/5ML PO SOLN: 650.0000 mg | ORAL | Status: DC | PRN

## 2020-07-14 MED ORDER — SODIUM CHLORIDE 0.9% FLUSH: 3.0000 mL | Freq: Once | INTRAVENOUS | Status: DC

## 2020-07-14 MED ORDER — ASPIRIN 325 MG PO TABS: 325.0000 mg | ORAL_TABLET | Freq: Every day | ORAL | Status: DC

## 2020-07-14 MED ORDER — ASPIRIN 300 MG RE SUPP: 300.0000 mg | Freq: Every day | RECTAL | Status: DC

## 2020-07-14 MED ORDER — ENOXAPARIN SODIUM 40 MG/0.4ML ~~LOC~~ SOLN: 40.0000 mg | SUBCUTANEOUS | Status: DC

## 2020-07-14 MED ORDER — SENNOSIDES-DOCUSATE SODIUM 8.6-50 MG PO TABS: 1.0000 | ORAL_TABLET | Freq: Every evening | ORAL | Status: DC | PRN

## 2020-07-14 MED ORDER — ALPRAZOLAM 0.25 MG PO TABS: 0.5000 mg | ORAL_TABLET | Freq: Every evening | ORAL | Status: DC | PRN

## 2020-07-14 MED ORDER — ESCITALOPRAM OXALATE 10 MG PO TABS: 10.0000 mg | ORAL_TABLET | Freq: Every day | ORAL | Status: DC

## 2020-07-14 NOTE — ED Notes (Signed)
Neurology notified that this pt is going to be DC's.  States daughter has been informed and should be on the way.

## 2020-07-14 NOTE — ED Provider Notes (Signed)
[facility] EMERGENCY DEPARTMENT Provider Note   CSN: [id number] Arrival date & time: 07/14/20  1249  An emergency department physician performed an initial assessment on this suspected stroke patient at 1250.  History Chief Complaint  Patient presents with  . Code Stroke    INIS BORNEMAN is a 86 y.o. female.  86 year old female presents with acute onset of expressive aphasia which occurred just prior to arrival.  Patient is on Eliquis for A. fib.  She has some confusion as well 2.  Endorse some right-sided weakness mostly in her upper extremity.  Her speech difficulties have since resolved but her subjective right-sided weakness remains.  Code stroke was called and patient seen by Dr. Leonel Ramsay on arrival.  Denies any recent history of illness.        Past Medical History:  Diagnosis Date  . Acute respiratory failure with hypoxia ([place]) 05/20/2017  . Anemia, unspecified 10/28/2012  . Anxiety state, unspecified 10/28/2012  . CAD (coronary artery disease)    a. Stent RCA in [facility];  b. Cath approx 2009 - nonobs per pt report.  . Cataract    immature on the left eye  . Chronic insomnia 02/07/2013  . Chronic lower back pain    scoliosis  . CKD (chronic kidney disease) stage 3, GFR 30-59 ml/min (HCC)   . CVA (cerebral infarction) 10/29/2012  . DDD (degenerative disc disease)   . Depression   . Diverticulosis   . GERD (gastroesophageal reflux disease) 09/01/2010  . Hemorrhoids   . Herniated nucleus pulposus, L5-S1, right 11/04/2015  . Hyperlipidemia    takes Lipitor daily  . Hypertension    takes Amlodipine,Losartan,Metoprolol,and HCTZ daily  . Incontinence of urine   . Insomnia    takes Restoril nightly  . Lumbar stenosis 04/25/2013  . Major depressive disorder, recurrent episode, moderate ([place]) 07/16/2013  . Osteoporosis   . PAF (paroxysmal atrial fibrillation) ([place]) 2011   a. lone epidode in 2011 according to notes.  . Rheumatoid arthritis ([place])    . Scoliosis   . Sepsis ([place]) 05/2019  . Temporal arteritis ([place]) 2011   a. followed @ [facility]; potential flareup 10/28/2012/notes 10/28/2012  . Vocal cord dysfunction    "they don't operate properly" (10/28/2012)    Patient Active Problem List   Diagnosis Date Noted  . Cough 05/24/2020  . Dyspnea 05/24/2020  . Optic neuritis 05/24/2020  . Osteopenia 05/24/2020  . Rash 05/24/2020  . Vitamin D deficiency 05/24/2020  . Palliative care by specialist   . Goals of care, counseling/discussion   . DNR (do not resuscitate)   . Weakness   . Advanced directives, counseling/discussion   . Pressure injury of skin 10/18/2019  . Closed fracture of right distal femur ([place]) 10/17/2019  . Femur fracture, right ([place]) 09/25/2019  . Chronic diastolic CHF (congestive heart failure) ([place]) 09/25/2019  . HCAP (healthcare-associated pneumonia) 06/19/2019  . Encephalopathy 06/19/2019  . Lumbar degenerative disc disease   . Paroxysmal A-fib ([place])   . Hypomagnesemia   . C. difficile colitis   . Diarrhea 05/29/2019  . CHF (congestive heart failure) ([place]) 05/29/2019  . Recurrent falls 04/30/2019  . Pericardial effusion without cardiac tamponade 03/29/2019  . Hypoalbuminemia 03/27/2019  . Cardiac tamponade   . Pericarditis 03/06/2019  . Fever   . Septic shock ([place])   . Atypical chest pain   . Atrial fibrillation with rapid ventricular response ([place])   . Nausea 01/17/2019  . Acute diverticulitis 01/13/2019  . Chronic  back pain 01/13/2019  . Tachycardia 01/13/2019  . Hypokalemia 01/13/2019  . Chronic low back pain 07/10/2018  . Influenza A 05/20/2017  . Severe sepsis ([place]) 05/20/2017  . Acute respiratory failure with hypoxia ([place]) 05/20/2017  . CKD (chronic kidney disease), stage III ([place]) 05/20/2017  . PAF (paroxysmal atrial fibrillation) ([place]) 05/20/2017  . HTN (hypertension) 05/20/2017  . Giant cell arteritis ([place]) 05/20/2017  . Coronary disease 05/20/2017  . Abnormal urinalysis 05/20/2017  .  Osteoporosis 05/08/2016  . Herniated nucleus pulposus, L5-S1, right 11/04/2015  . Major depressive disorder, recurrent episode, moderate ([place]) 07/16/2013  . Abdominal pain, epigastric 06/22/2013  . Lumbar stenosis 04/25/2013  . Chronic insomnia 02/07/2013  . Elevated transaminase level 02/07/2013  . CVA (cerebral infarction) 10/29/2012  . Visual changes 10/28/2012  . Depression 10/28/2012  . Anxiety state 10/28/2012  . Anemia, unspecified 10/28/2012  . Nonspecific (abnormal) findings on radiological and other examination of gastrointestinal tract 01/25/2012  . Hyponatremia 01/24/2012  . Hematuria 01/24/2012  . Enteritis 12/24/2011  . Abdominal pain, other specified site 12/24/2011  . Leg pain, right 02/18/2011  . Rheumatoid arthritis ([place]) 02/04/2011  . Temporal arteritis ([place]) 09/01/2010  . Hypertension 09/01/2010  . Hyperlipidemia 09/01/2010  . History of atrial fibrillation 09/01/2010  . CAD (coronary artery disease) 09/01/2010  . GERD (gastroesophageal reflux disease) 09/01/2010  . Insomnia 09/01/2010    Past Surgical History:  Procedure Laterality Date  . ABDOMINAL HYSTERECTOMY  ~ 1984   vaginally  . BACK SURGERY  7-23yrs ago   X Stop  . BLADDER SUSPENSION  2001  . BREAST BIOPSY Right   . CATARACT EXTRACTION W/ INTRAOCULAR LENS IMPLANT Right ~ 08/2012  . CHEST TUBE INSERTION Left 03/08/2019   Procedure: Chest Tube Insertion;  Surgeon: Ivin Poot, MD;  Location: [facility];  Service: Thoracic;  Laterality: Left;  . COLONOSCOPY  01/26/2012   Procedure: COLONOSCOPY;  Surgeon: Ladene Artist, MD,FACG;  Location: [facility] ENDOSCOPY;  Service: Endoscopy;  Laterality: N/A;  note the EGD is possible  . CORONARY ANGIOPLASTY WITH STENT PLACEMENT  2006   X 1 stent  . EPIDURAL BLOCK INJECTION    . ESOPHAGOGASTRODUODENOSCOPY  01/26/2012   Procedure: ESOPHAGOGASTRODUODENOSCOPY (EGD);  Surgeon: Ladene Artist, MD,FACG;  Location: [facility] ENDOSCOPY;  Service: Endoscopy;  Laterality: N/A;  .  FINE NEEDLE ASPIRATION Right 09/26/2019   Procedure: FINE NEEDLE ASPIRATION;  Surgeon: Altamese [place], MD;  Location: [facility];  Service: Orthopedics;  Laterality: Right;  . HEMIARTHROPLASTY HIP Right 2012  . IR EPIDUROGRAPHY  04/20/2019  . LAPAROSCOPIC CHOLECYSTECTOMY  2001  . LUMBAR FUSION  03/2013  . LUMBAR LAMINECTOMY/DECOMPRESSION MICRODISCECTOMY Right 11/04/2015   Procedure: Right Lumbar Five-Sacral One Microdiskectomy;  Surgeon: Kristeen Miss, MD;  Location: [facility] NEURO ORS;  Service: Neurosurgery;  Laterality: Right;  Right L5-S1 Microdiskectomy  . moles removed that required stiches     one on leg and one on face  . ORIF FEMUR FRACTURE Right 09/26/2019   Procedure: OPEN REDUCTION INTERNAL FIXATION (ORIF) DISTAL FEMUR FRACTURE;  Surgeon: Altamese [place], MD;  Location: [facility];  Service: Orthopedics;  Laterality: Right;  . ORIF FEMUR FRACTURE Right 10/17/2019   Procedure: OPEN REDUCTION INTERNAL FIXATION (ORIF) DISTAL FEMUR FRACTURE;  Surgeon: Altamese [place], MD;  Location: [facility];  Service: Orthopedics;  Laterality: Right;  . SUBXYPHOID PERICARDIAL WINDOW N/A 03/08/2019   Procedure: SUBXYPHOID PERICARDIAL WINDOW;  Surgeon: Ivin Poot, MD;  Location: [facility];  Service: Thoracic;  Laterality: N/A;  . TEE WITHOUT CARDIOVERSION  N/A 03/08/2019   Procedure: TRANSESOPHAGEAL ECHOCARDIOGRAM (TEE);  Surgeon: Prescott Gum, Collier [place]ina, MD;  Location: [facility];  Service: Thoracic;  Laterality: N/A;  . TEMPORAL ARTERY BIOPSY / LIGATION Bilateral 2011  . TONSILLECTOMY AND ADENOIDECTOMY     at age 67  . TUBAL LIGATION  ~ 1982  . X-STOP IMPLANTATION  ~ 2010   "lower back" (10/28/2012)     OB History   No obstetric history on file.     Family History  Problem Relation Age of Onset  . Brain cancer Mother 35  . Heart attack Father   . Breast cancer Daughter 69  . Heart disease Other   . Hypertension Other   . Arthritis Neg Hx   . Colon cancer Neg Hx   . Osteoporosis Neg Hx     Social History   Tobacco Use  .  Smoking status: Never Smoker  . Smokeless tobacco: Never Used  Vaping Use  . Vaping Use: Never used  Substance Use Topics  . Alcohol use: Yes    Comment: socially  . Drug use: No    Home Medications Prior to Admission medications   Medication Sig Start Date End Date Taking? Authorizing Provider  alendronate (FOSAMAX) 70 MG tablet Take 1 tablet (70 mg total) by mouth every 7 (seven) days. Take with a full glass of water on an empty stomach. 06/14/20   Burchette, Alinda Sierras, MD  ALPRAZolam Duanne Moron) 0.5 MG tablet Take 1 tablet (0.5 mg total) by mouth at bedtime as needed for sleep. 01/19/20   Burchette, Alinda Sierras, MD  cholecalciferol (VITAMIN D) 25 MCG tablet Take 2 tablets (2,000 Units total) by mouth 2 (two) times daily. 09/29/19   Ainsley Spinner, PA-C  diltiazem (CARDIZEM CD) 240 MG 24 hr capsule Take 1 capsule (240 mg total) by mouth daily. 03/28/20   Burnell Blanks, MD  ELIQUIS 5 MG TABS tablet TAKE 1 TABLET BY MOUTH TWICE A DAY 05/09/20   Burnell Blanks, MD  escitalopram (LEXAPRO) 10 MG tablet Take 1 tablet (10 mg total) by mouth daily. 02/09/20   Burchette, Alinda Sierras, MD  famotidine (PEPCID) 20 MG tablet Take 1 tablet (20 mg total) by mouth daily. 06/02/19   Eugenie Filler, MD  fentaNYL (DURAGESIC) 25 MCG/HR Place 1 patch onto the skin every 3 (three) days. 06/14/20   Burchette, Alinda Sierras, MD  fentaNYL (DURAGESIC) 25 MCG/HR Place one patch onto the skin every 3 days.   May refill in one month. 06/14/20   Burchette, Alinda Sierras, MD  fentaNYL (DURAGESIC) 25 MCG/HR Place one patch onto the skin every 3 days.  May refill in two months. 06/14/20   Burchette, Alinda Sierras, MD  ferrous sulfate 325 (65 FE) MG tablet Take 1 tablet (325 mg total) by mouth 2 (two) times daily with a meal. 04/21/19   Oswald Hillock, MD  furosemide (LASIX) 40 MG tablet TAKE 1 TABLET BY MOUTH EVERY DAY 06/07/20   Burnell Blanks, MD  HYDROcodone-acetaminophen (NORCO) 10-325 MG tablet Take one tablet by mouth every 8 hours  as needed for breakthrough pain. 07/05/20   Burchette, Alinda Sierras, MD  LACTOBACILLUS PO Take 2 capsules by mouth 3 (three) times daily.    [provider]  losartan (COZAAR) 50 MG tablet Take 1 tablet (50 mg total) by mouth daily. 05/24/20   Burchette, Alinda Sierras, MD  metoprolol tartrate (LOPRESSOR) 50 MG tablet Take 1 tablet (50 mg total) by mouth 2 (two) times daily. 10/30/19  Charlynne Cousins, MD  Multiple Vitamin (MULTIVITAMIN WITH MINERALS) TABS tablet Take 1 tablet by mouth daily. 10/21/19   Ainsley Spinner, PA-C  potassium chloride (KLOR-CON) 10 MEQ tablet Take 1 tablet (10 mEq total) by mouth daily. 06/07/20   Burchette, Alinda Sierras, MD  predniSONE (DELTASONE) 10 MG tablet Take 1 tablet (10 mg total) by mouth daily with breakfast. 10/30/19   Charlynne Cousins, MD  senna-docusate (SENOKOT-S) 8.6-50 MG tablet Take 1 tablet by mouth at bedtime.    [provider]  vitamin C (ASCORBIC ACID) 500 MG tablet Take 1,000 mg by mouth daily.    [provider]  vitamin E 180 MG (400 UNITS) capsule Take 400 Units by mouth daily.    [provider]    Allergies    Dextromethorphan  Review of Systems   Review of Systems  All other systems reviewed and are negative.   Physical Exam Updated Vital Signs BP (!) 177/77 (BP Location: Left Arm)   Pulse (!) 58   Temp 98.6 F (37 C) (Oral)   Wt 72.6 kg   SpO2 93%   BMI 27.47 kg/m   Physical Exam Vitals and nursing note reviewed.  Constitutional:      General: She is not in acute distress.    Appearance: Normal appearance. She is well-developed. She is not toxic-appearing.  HENT:     Head: Normocephalic and atraumatic.  Eyes:     General: Lids are normal.     Conjunctiva/sclera: Conjunctivae normal.     Pupils: Pupils are equal, round, and reactive to light.  Neck:     Thyroid: No thyroid mass.     Trachea: No tracheal deviation.  Cardiovascular:     Rate and Rhythm: Normal rate and regular rhythm.     Heart sounds:  Normal heart sounds. No murmur heard. No gallop.   Pulmonary:     Effort: Pulmonary effort is normal. No respiratory distress.     Breath sounds: Normal breath sounds. No stridor. No decreased breath sounds, wheezing, rhonchi or rales.  Abdominal:     General: Bowel sounds are normal. There is no distension.     Palpations: Abdomen is soft.     Tenderness: There is no abdominal tenderness. There is no rebound.  Musculoskeletal:        General: No tenderness. Normal range of motion.     Cervical back: Normal range of motion and neck supple.  Skin:    General: Skin is warm and dry.     Findings: No abrasion or rash.  Neurological:     General: No focal deficit present.     Mental Status: She is alert and oriented to person, place, and time.     GCS: GCS eye subscore is 4. GCS verbal subscore is 5. GCS motor subscore is 6.     Cranial Nerves: Cranial nerves are intact. No cranial nerve deficit.     Sensory: No sensory deficit.     Motor: Motor function is intact.     Coordination: Coordination is intact.  Psychiatric:        Attention and Perception: Attention normal.        Mood and Affect: Mood normal.        Speech: Speech normal.        Behavior: Behavior normal.     ED Results / Procedures / Treatments   Labs (all labs ordered are listed, but only abnormal results are displayed) Labs Reviewed  CBC -  Abnormal; Notable for the following components:      Result Value   WBC 18.9 (*)    All other components within normal limits  DIFFERENTIAL - Abnormal; Notable for the following components:   Neutro Abs 15.0 (*)    Monocytes Absolute 1.3 (*)    Abs Immature Granulocytes 0.29 (*)    All other components within normal limits  COMPREHENSIVE METABOLIC PANEL - Abnormal; Notable for the following components:   Chloride 96 (*)    Glucose, Bld 108 (*)    GFR, Estimated 56 (*)    All other components within normal limits  I-STAT CHEM 8, ED - Abnormal; Notable for the following  components:   BUN 25 (*)    Creatinine, Ser 1.10 (*)    Glucose, Bld 107 (*)    All other components within normal limits  CBG MONITORING, ED - Abnormal; Notable for the following components:   Glucose-Capillary 114 (*)    All other components within normal limits  PROTIME-INR  APTT    EKG EKG Interpretation  Date/Time:  Sunday July 14 2020 13:22:05 EDT Ventricular Rate:  57 PR Interval:  204 QRS Duration: 98 QT Interval:  443 QTC Calculation: 432 R Axis:   1 Text Interpretation: Sinus rhythm Abnormal R-wave progression, early transition Inferior infarct, old Lateral leads are also involved No significant change since last tracing Confirmed by Lacretia Leigh [phone]) on 07/14/2020 1:32:11 PM   Radiology CT HEAD CODE STROKE WO CONTRAST  Result Date: 07/14/2020 CLINICAL DATA:  Code stroke. Neuro deficit, acute, stroke suspected. Last known normal 1200, aphasia. EXAM: CT HEAD WITHOUT CONTRAST TECHNIQUE: Contiguous axial images were obtained from the base of the skull through the vertex without intravenous contrast. COMPARISON:  Prior head CT examinations 10/17/2019 and earlier. FINDINGS: Brain: Mild cerebral and cerebellar atrophy. Severe patchy and confluent hypoattenuation within the bilateral cerebral white matter, nonspecific. There is no acute intracranial hemorrhage. No demarcated cortical infarct. No extra-axial fluid collection. No evidence of intracranial mass. No midline shift. Vascular: No hyperdense vessel.  Atherosclerotic calcifications Skull: Normal. Negative for fracture or focal lesion. Sinuses/Orbits: Visualized orbits show no acute finding. No significant paranasal sinus disease at the imaged levels. ASPECTS ([place] Stroke Program Early CT Score) - Ganglionic level infarction (caudate, lentiform nuclei, internal capsule, insula, M1-M3 cortex): 7 - Supraganglionic infarction (M4-M6 cortex): 3 Total score (0-10 with 10 being normal): 10 These results were communicated to  [place] at 1:06 pmon 4/17/2022by text page via the [facility] messaging system. IMPRESSION: No evidence of acute intracranial abnormality. Severe, but nonspecific chronic cerebral white matter chronic small vessel ischemic disease. Electronically Signed   By: Kellie Simmering DO   On: 07/14/2020 13:06   CT ANGIO HEAD NECK W WO CM (CODE STROKE)  Result Date: 07/14/2020 CLINICAL DATA:  Stroke, follow-up. EXAM: CT HEAD WITHOUT CONTRAST CT ANGIOGRAPHY HEAD AND NECK TECHNIQUE: Multidetector CT imaging of the head and neck was performed using the standard protocol during bolus administration of intravenous contrast. Multiplanar CT image reconstructions and MIPs were obtained to evaluate the vascular anatomy. Carotid stenosis measurements (when applicable) are obtained utilizing NASCET criteria, using the distal internal carotid diameter as the denominator. Multiphase CT imaging of the brain was performed following IV bolus contrast injection. Subsequent parametric perfusion maps were calculated using RAPID software. CONTRAST:  58mL OMNIPAQUE IOHEXOL 350 MG/ML SOLN COMPARISON:  Noncontrast head CT performed earlier today. FINDINGS: CTA NECK FINDINGS Aortic arch: Standard aortic branching. Atherosclerotic plaque within the visualized aortic arch  and proximal major branch vessels of the neck. No hemodynamically significant innominate or proximal subclavian artery stenosis. Right carotid system: CCA and ICA patent within the neck without stenosis. Mild atherosclerotic plaque within the carotid bifurcation. Left carotid system: CCA and ICA patent within the neck without stenosis. Mild atherosclerotic plaque within the carotid bifurcation. Vertebral arteries: Patent within the neck without stenosis. Left vertebral artery dominant. Skeleton: No acute bony abnormality or aggressive osseous lesion. Cervical spondylosis. C4-C5 grade 1 anterolisthesis. Other neck: No neck mass or cervical lymphadenopathy. Multiple thyroid nodules  measuring up to 13 mm, not meeting consensus criteria for ultrasound follow-up based on size. Upper chest: No consolidation within the imaged lung apices Review of the MIP images confirms the above findings CTA HEAD FINDINGS Anterior circulation: The intracranial internal carotid arteries are patent. Calcified plaque within both vessels without stenosis. The M1 middle cerebral arteries are patent. The anterior cerebral arteries are patent. No intracranial aneurysm is identified. Posterior circulation: The intracranial vertebral arteries are patent. The basilar artery is patent. The posterior cerebral arteries are patent. Mild atherosclerotic stenosis of the proximal P2 left PCA. Posterior communicating arteries are hypoplastic or absent bilaterally. Venous sinuses: Within the limitations of contrast timing, no convincing thrombus. Anatomic variants: As described Review of the MIP images confirms the above findings These results were communicated to [place] at [place] 4/17/2022by text page via the [facility] messaging system. IMPRESSION: CTA neck: The common carotid, internal carotid and vertebral arteries are patent within the neck without hemodynamically significant stenosis. Mild atherosclerotic disease within the carotid systems, as described. CTA head: 1. No intracranial large vessel occlusion or proximal high-grade arterial stenosis. 2. Mild stenosis within the proximal P2 segment of the left posterior cerebral artery. Electronically Signed   By: Kellie Simmering DO   On: 07/14/2020 13:36    Procedures Procedures   Medications Ordered in ED Medications  sodium chloride flush (NS) 0.9 % injection 3 mL (has no administration in time range)  iohexol (OMNIPAQUE) 350 MG/ML injection 75 mL (75 mLs Intravenous Contrast Given 07/14/20 1317)    ED Course  I have reviewed the triage vital signs and the nursing notes.  Pertinent labs & imaging results that were available during my care of the patient were  reviewed by me and considered in my medical decision making (see chart for details).    MDM Rules/Calculators/A&P                          CT angiogram of brain and neck without evidence of acute findings.  Patient has MRI ordered by Dr. Leonel Ramsay.  Patient will require inpatient admission and will consult hospitalist service Final Clinical Impression(s) / ED Diagnoses Final diagnoses:  None    Rx / DC Orders ED Discharge Orders    None       Lacretia Leigh, MD 07/14/20 1356

## 2020-07-14 NOTE — ED Provider Notes (Signed)
Patient seen by neurology and medicine.  Overall decision was made to have her follow-up with neurology outpatient.  She is already on maximal therapy.  No change in medications at this time.  We will follow up with neurology.  Understands return precautions.  Discharged in good condition.  This chart was dictated using voice recognition software.  Despite best efforts to proofread,  errors can occur which can change the documentation meaning.     Lennice Sites, DO 07/14/20 1725

## 2020-07-14 NOTE — ED Notes (Signed)
DC instructions reviewed with the pt.  Pt verbalized understanding.  PT DC.

## 2020-07-14 NOTE — ED Notes (Signed)
Patient transported to MRI 

## 2020-07-14 NOTE — Consult Note (Signed)
Neurology Consultation Reason for Consult: Aphasia Referring Physician: Zenia Resides, T  CC: Aphasia  History is obtained from:patient, sister  HPI: Betty Jackson is a 86 y.o. female with a history of afib on anticoagulation who was in her normal health while she was with earlier today.  They sat down and work on the left moving when her sister after something and realized that response was very brief.  She looked over at her, and that he did not notice any weakness, he kept asking her questions and the patient would not respond.  The patient states that she did understand everything that was being said, she just could not get her words out.  This resolved after EMS arrived and she was brought into the emergency department.  On arrival, her speech had essentially returned to normal.   LKW: Noon tpa given?: no, resolution of symptoms    ROS: A 14 point ROS was performed and is negative except as noted in the HPI.   Past Medical History:  Diagnosis Date  . Acute respiratory failure with hypoxia ([place]) 05/20/2017  . Anemia, unspecified 10/28/2012  . Anxiety state, unspecified 10/28/2012  . CAD (coronary artery disease)    a. Stent RCA in [facility];  b. Cath approx 2009 - nonobs per pt report.  . Cataract    immature on the left eye  . Chronic insomnia 02/07/2013  . Chronic lower back pain    scoliosis  . CKD (chronic kidney disease) stage 3, GFR 30-59 ml/min (HCC)   . CVA (cerebral infarction) 10/29/2012  . DDD (degenerative disc disease)   . Diverticulosis   . GERD (gastroesophageal reflux disease) 09/01/2010  . Hemorrhoids   . Herniated nucleus pulposus, L5-S1, right 11/04/2015  . Hyperlipidemia    takes Lipitor daily  . Hypertension    takes Amlodipine,Losartan,Metoprolol,and HCTZ daily  . Incontinence of urine   . Insomnia    takes Restoril nightly  . Lumbar stenosis 04/25/2013  . Major depressive disorder, recurrent episode, moderate ([place]) 07/16/2013  . Osteoporosis   . PAF  (paroxysmal atrial fibrillation) ([place]) 2011   a. lone epidode in 2011 according to notes.  . Rheumatoid arthritis ([place])   . Scoliosis   . Sepsis ([place]) 05/2019  . Temporal arteritis ([place]) 2011   a. followed @ [facility]; potential flareup 10/28/2012/notes 10/28/2012  . Vocal cord dysfunction    "they don't operate properly" (10/28/2012)     Family History  Problem Relation Age of Onset  . Brain cancer Mother 5  . Heart attack Father   . Breast cancer Daughter 73  . Heart disease Other   . Hypertension Other   . Arthritis Neg Hx   . Colon cancer Neg Hx   . Osteoporosis Neg Hx      Social History:  reports that she has never smoked. She has never used smokeless tobacco. She reports current alcohol use. She reports that she does not use drugs.   Exam: Current vital signs: BP (!) 162/67   Pulse (!) 55   Temp 98.6 F (37 C) (Oral)   Resp 14   Wt 72.6 kg   SpO2 96%   BMI 27.47 kg/m  Vital signs in last 24 hours: Temp:  [98.6 F (37 C)] 98.6 F (37 C) (04/17 1322) Pulse Rate:  [55-58] 55 (04/17 1500) Resp:  [14-17] 14 (04/17 1500) BP: (143-177)/(67-77) 162/67 (04/17 1500) SpO2:  [89 %-96 %] 96 % (04/17 1500) Weight:  [72.6 kg] 72.6 kg (04/17 1200)  Physical Exam  Constitutional: Appears well-developed and well-nourished.  Psych: Affect appropriate to situation Eyes: No scleral injection HENT: No OP obstruction MSK: no joint deformities.  Cardiovascular: Normal rate and regular rhythm.  Respiratory: Effort normal, non-labored breathing GI: Soft.  No distension. There is no tenderness.  Skin: WDI  Neuro: Mental Status: Patient is awake, alert, interactive and appropriate.  Fully would make occasional mistakes such as saying "my thumb" when I asked her to identify the examiner's thumb, but this rapidly improved Cranial Nerves: II: Visual Fields are full. Pupils are equal, round, and reactive to light.   III,IV, VI: EOMI without ptosis or diploplia.  V: Facial sensation  is symmetric to temperature VII: Facial movement is symmetric.  VIII: hearing is intact to voice X: Uvula elevates symmetrically XI: Shoulder shrug is symmetric. XII: tongue is midline without atrophy or fasciculations.  Motor: Tone is normal. Bulk is normal. 5/5 strength was present in all four extremities.  Sensory: Sensation is symmetric to light touch and temperature in the arms and legs. Cerebellar: FNF and HKS are intact bilaterally      I have reviewed labs in epic and the results pertinent to this consultation are: Cr 0.99  I have reviewed the images obtained: CTA-no significant stenosis, MRI-no stroke  Impression: 86 year old female with transient aphasia.  My suspicion is that this represents a transient ischemic attack.  With her already being on anticoagulation and vascular imaging already performed, I think that the benefit of admission may actually be less than the risk given that she has history of delirium with admission.  There is a small chance that a focal seizure.  He has similar findings, but with her preservation of the memory of the event, I think that this is less likely.  Recommendations: 1) lipid panel as an outpatient with goal LDL less than 70 2) continue Eliquis for secondary stroke prevention 3) consider outpatient EEG 4) patient can follow-up with outpatient neurology.+   Roland Rack, MD [facility] [phone]  If 7pm- 7am, please page neurology on call as listed in [place].

## 2020-07-14 NOTE — Progress Notes (Signed)
Stroke Response Nurse Documentation Code Documentation  Betty Jackson is a 86 y.o. female arriving to [facility] ED via EMS on 07/14/20 at 1249 with past medical hx of hypertension and atrial fib. Code stroke was activated by EMS. Patient from home where she was LKW at 1200 and now complaining of aphasia . On anticoagulation for atrial fib. Stroke team at the bedside on patient arrival. Labs drawn and patient cleared for CT by Dr. Zenia Resides. Patient to CT with team. NIHSS 1 for aphasia, see documentation for details and code stroke times. Patient with NIH of 1 on exam. The following imaging was completed:  CT and CTA. Patient is not a candidate for tPA due to neurologist decision. [place]  Rapid Response RN

## 2020-07-14 NOTE — H&P (Addendum)
Consult Note   *Changed to Consult Note, as MRI is negative and family prefers to take patient home.Betty Jackson:[id number] DOB: [birth year] DOA: 07/14/2020  PCP: Eulas Post, MD Consultants:  [facility] - cardiology; [facility] - orthopedics; [facility] - neurosurgery; rheumatology at [place] Patient coming from:  Home - lives alone, has daytime caregivers; NOK: [place], [place], [phone]  Chief Complaint: Neurologic symptoms  HPI: Betty Jackson is a 86 y.o. female with medical history significant of temporal arteritis; RA; afib; HTN; HLD: depression; CVA; stage 3 CKD; and CAD s/p stent presenting with neurologic symptoms.  She was having difficulty speaking today.  Her sister noticed difficulty making words.  This happened about noon.  She was doing great this AM - had breakfast, took a shower, got dressed.  She left and her sister noticed the change.  She is able to transfer but is non-ambulatory at baseline.  She had a femur fracture x 2 and since then has been in therapy a couple of times a week for maybe a year.  She is not independent with her ADLs - she is able to feed herself, can assist with ADLs.  No dysphagia.      ED Course:  Likely stroke - aphasia, R sided weakness.  Neuro has seen, for MRI.  Needs further evaluation.  Review of Systems: As per HPI; otherwise review of systems reviewed and negative.   Ambulatory Status:  Mostly nonambulatory  COVID Vaccine Status:  Complete plus booster  Past Medical History:  Diagnosis Date  . Acute respiratory failure with hypoxia ([place]) 05/20/2017  . Anemia, unspecified 10/28/2012  . Anxiety state, unspecified 10/28/2012  . CAD (coronary artery disease)    a. Stent RCA in [facility];  b. Cath approx 2009 - nonobs per pt report.  . Cataract    immature on the left eye  . Chronic insomnia 02/07/2013  . Chronic lower back pain    scoliosis  . CKD (chronic kidney disease) stage 3, GFR 30-59 ml/min (HCC)   . CVA (cerebral  infarction) 10/29/2012  . DDD (degenerative disc disease)   . Diverticulosis   . GERD (gastroesophageal reflux disease) 09/01/2010  . Hemorrhoids   . Herniated nucleus pulposus, L5-S1, right 11/04/2015  . Hyperlipidemia    takes Lipitor daily  . Hypertension    takes Amlodipine,Losartan,Metoprolol,and HCTZ daily  . Incontinence of urine   . Insomnia    takes Restoril nightly  . Lumbar stenosis 04/25/2013  . Major depressive disorder, recurrent episode, moderate ([place]) 07/16/2013  . Osteoporosis   . PAF (paroxysmal atrial fibrillation) ([place]) 2011   a. lone epidode in 2011 according to notes.  . Rheumatoid arthritis ([place])   . Scoliosis   . Sepsis ([place]) 05/2019  . Temporal arteritis ([place]) 2011   a. followed @ [facility]; potential flareup 10/28/2012/notes 10/28/2012  . Vocal cord dysfunction    "they don't operate properly" (10/28/2012)    Past Surgical History:  Procedure Laterality Date  . ABDOMINAL HYSTERECTOMY  ~ 1984   vaginally  . BACK SURGERY  7-48yrs ago   X Stop  . BLADDER SUSPENSION  2001  . BREAST BIOPSY Right   . CATARACT EXTRACTION W/ INTRAOCULAR LENS IMPLANT Right ~ 08/2012  . CHEST TUBE INSERTION Left 03/08/2019   Procedure: Chest Tube Insertion;  Surgeon: Ivin Poot, MD;  Location: [place];  Service: Thoracic;  Laterality: Left;  . COLONOSCOPY  01/26/2012   Procedure: COLONOSCOPY;  Surgeon: Ladene Artist, MD,FACG;  Location: [facility];  Service: Endoscopy;  Laterality: N/A;  note the EGD is possible  . CORONARY ANGIOPLASTY WITH STENT PLACEMENT  2006   X 1 stent  . EPIDURAL BLOCK INJECTION    . ESOPHAGOGASTRODUODENOSCOPY  01/26/2012   Procedure: ESOPHAGOGASTRODUODENOSCOPY (EGD);  Surgeon: Ladene Artist, MD,FACG;  Location: [facility] ENDOSCOPY;  Service: Endoscopy;  Laterality: N/A;  . FINE NEEDLE ASPIRATION Right 09/26/2019   Procedure: FINE NEEDLE ASPIRATION;  Surgeon: Altamese Organ, MD;  Location: [place];  Service: Orthopedics;  Laterality: Right;  . HEMIARTHROPLASTY HIP Right  2012  . IR EPIDUROGRAPHY  04/20/2019  . LAPAROSCOPIC CHOLECYSTECTOMY  2001  . LUMBAR FUSION  03/2013  . LUMBAR LAMINECTOMY/DECOMPRESSION MICRODISCECTOMY Right 11/04/2015   Procedure: Right Lumbar Five-Sacral One Microdiskectomy;  Surgeon: Kristeen Miss, MD;  Location: [place] [facility];  Service: Neurosurgery;  Laterality: Right;  Right L5-S1 Microdiskectomy  . moles removed that required stiches     one on leg and one on face  . ORIF FEMUR FRACTURE Right 09/26/2019   Procedure: OPEN REDUCTION INTERNAL FIXATION (ORIF) DISTAL FEMUR FRACTURE;  Surgeon: Altamese [place], MD;  Location: [place];  Service: Orthopedics;  Laterality: Right;  . ORIF FEMUR FRACTURE Right 10/17/2019   Procedure: OPEN REDUCTION INTERNAL FIXATION (ORIF) DISTAL FEMUR FRACTURE;  Surgeon: Altamese [place], MD;  Location: [place];  Service: Orthopedics;  Laterality: Right;  . SUBXYPHOID PERICARDIAL WINDOW N/A 03/08/2019   Procedure: SUBXYPHOID PERICARDIAL WINDOW;  Surgeon: Ivin Poot, MD;  Location: [place];  Service: Thoracic;  Laterality: N/A;  . TEE WITHOUT CARDIOVERSION N/A 03/08/2019   Procedure: TRANSESOPHAGEAL ECHOCARDIOGRAM (TEE);  Surgeon: Prescott Gum, Collier Salina, MD;  Location: [place];  Service: Thoracic;  Laterality: N/A;  . TEMPORAL ARTERY BIOPSY / LIGATION Bilateral 2011  . TONSILLECTOMY AND ADENOIDECTOMY     at age 48  . TUBAL LIGATION  ~ 1982  . X-STOP IMPLANTATION  ~ 2010   "lower back" (10/28/2012)    Social History   Socioeconomic History  . Marital status: Married    Spouse name: Not on file  . Number of children: 3  . Years of education: Not on file  . Highest education level: Not on file  Occupational History  . Occupation: Retired Cabin crew  Tobacco Use  . Smoking status: Never Smoker  . Smokeless tobacco: Never Used  Vaping Use  . Vaping Use: Never used  Substance and Sexual Activity  . Alcohol use: Yes    Comment: socially  . Drug use: No  . Sexual activity: Not on file  Other Topics Concern  . Not on file   Social History Narrative   Lives in [place] with her husband.  Retired Cabin crew.   Social Determinants of Health   Financial Resource Strain: Not on file  Food Insecurity: Not on file  Transportation Needs: Not on file  Physical Activity: Not on file  Stress: Not on file  Social Connections: Not on file  Intimate Partner Violence: Not on file    Allergies  Allergen Reactions  . Dextromethorphan Rash    Family History  Problem Relation Age of Onset  . Brain cancer Mother 73  . Heart attack Father   . Breast cancer Daughter 72  . Heart disease Other   . Hypertension Other   . Arthritis Neg Hx   . Colon cancer Neg Hx   . Osteoporosis Neg Hx     Prior to Admission medications   Medication Sig Start Date End Date Taking? Authorizing Provider  alendronate (  FOSAMAX) 70 MG tablet Take 1 tablet (70 mg total) by mouth every 7 (seven) days. Take with a full glass of water on an empty stomach. 06/14/20   Burchette, Alinda Sierras, MD  ALPRAZolam Duanne Moron) 0.5 MG tablet Take 1 tablet (0.5 mg total) by mouth at bedtime as needed for sleep. 01/19/20   Burchette, Alinda Sierras, MD  cholecalciferol (VITAMIN D) 25 MCG tablet Take 2 tablets (2,000 Units total) by mouth 2 (two) times daily. 09/29/19   Ainsley Spinner, PA-C  diltiazem (CARDIZEM CD) 240 MG 24 hr capsule Take 1 capsule (240 mg total) by mouth daily. 03/28/20   Burnell Blanks, MD  ELIQUIS 5 MG TABS tablet TAKE 1 TABLET BY MOUTH TWICE A DAY 05/09/20   Burnell Blanks, MD  escitalopram (LEXAPRO) 10 MG tablet Take 1 tablet (10 mg total) by mouth daily. 02/09/20   Burchette, Alinda Sierras, MD  famotidine (PEPCID) 20 MG tablet Take 1 tablet (20 mg total) by mouth daily. 06/02/19   Eugenie Filler, MD  fentaNYL (DURAGESIC) 25 MCG/HR Place 1 patch onto the skin every 3 (three) days. 06/14/20   Burchette, Alinda Sierras, MD  fentaNYL (DURAGESIC) 25 MCG/HR Place one patch onto the skin every 3 days.   May refill in one month. 06/14/20   Burchette, Alinda Sierras,  MD  fentaNYL (DURAGESIC) 25 MCG/HR Place one patch onto the skin every 3 days.  May refill in two months. 06/14/20   Burchette, Alinda Sierras, MD  ferrous sulfate 325 (65 FE) MG tablet Take 1 tablet (325 mg total) by mouth 2 (two) times daily with a meal. 04/21/19   Oswald Hillock, MD  furosemide (LASIX) 40 MG tablet TAKE 1 TABLET BY MOUTH EVERY DAY 06/07/20   Burnell Blanks, MD  HYDROcodone-acetaminophen (NORCO) 10-325 MG tablet Take one tablet by mouth every 8 hours as needed for breakthrough pain. 07/05/20   Burchette, Alinda Sierras, MD  LACTOBACILLUS PO Take 2 capsules by mouth 3 (three) times daily.    [provider]  losartan (COZAAR) 50 MG tablet Take 1 tablet (50 mg total) by mouth daily. 05/24/20   Burchette, Alinda Sierras, MD  metoprolol tartrate (LOPRESSOR) 50 MG tablet Take 1 tablet (50 mg total) by mouth 2 (two) times daily. 10/30/19   Charlynne Cousins, MD  Multiple Vitamin (MULTIVITAMIN WITH MINERALS) TABS tablet Take 1 tablet by mouth daily. 10/21/19   Ainsley Spinner, PA-C  potassium chloride (KLOR-CON) 10 MEQ tablet Take 1 tablet (10 mEq total) by mouth daily. 06/07/20   Burchette, Alinda Sierras, MD  predniSONE (DELTASONE) 10 MG tablet Take 1 tablet (10 mg total) by mouth daily with breakfast. 10/30/19   Charlynne Cousins, MD  senna-docusate (SENOKOT-S) 8.6-50 MG tablet Take 1 tablet by mouth at bedtime.    [provider]  vitamin C (ASCORBIC ACID) 500 MG tablet Take 1,000 mg by mouth daily.    [provider]  vitamin E 180 MG (400 UNITS) capsule Take 400 Units by mouth daily.    [provider]    Physical Exam: Vitals:   07/14/20 1322 07/14/20 1330 07/14/20 1400 07/14/20 1500  BP: (!) 177/77 (!) 162/68 (!) 143/68 (!) 162/67  Pulse: (!) 58 (!) 56 (!) 55 (!) 55  Resp:  14 17 14   Temp: 98.6 F (37 C)     TempSrc: Oral     SpO2: 93% 93% (!) 89% 96%  Weight:         . General:  Appears  calm and comfortable and is in NAD; somewhat aloof . Eyes:  PERRL, EOMI,  normal lids, iris . ENT:  Hard of hearing, grossly normal lips & tongue, mmm; appropriate dentition . Neck:  no LAD, masses or thyromegaly; no carotid bruits . Cardiovascular:  RRR, no m/r/g. No LE edema.  Marland Kitchen Respiratory:   CTA bilaterally with no wheezes/rales/rhonchi.  Normal respiratory effort. . Abdomen:  soft, NT, ND, NABS . Skin:  no rash or induration seen on limited exam . Musculoskeletal:  grossly normal tone BUE/BLE with subtle RUE > RLE weakness 4-5/5, good ROM, no bony abnormality . Lower extremity:  No LE edema.  Limited foot exam with no ulcerations.  2+ distal pulses. Marland Kitchen Psychiatric:  Flat mood and affect, speech fluent and appropriate, AOx3 . Neurologic:  CN 2-12 grossly intact, moves all extremities in coordinated fashion, sensation intact    Radiological Exams on Admission: Independently reviewed - see discussion in A/P where applicable  CT HEAD CODE STROKE WO CONTRAST  Result Date: 07/14/2020 CLINICAL DATA:  Code stroke. Neuro deficit, acute, stroke suspected. Last known normal 1200, aphasia. EXAM: CT HEAD WITHOUT CONTRAST TECHNIQUE: Contiguous axial images were obtained from the base of the skull through the vertex without intravenous contrast. COMPARISON:  Prior head CT examinations 10/17/2019 and earlier. FINDINGS: Brain: Mild cerebral and cerebellar atrophy. Severe patchy and confluent hypoattenuation within the bilateral cerebral white matter, nonspecific. There is no acute intracranial hemorrhage. No demarcated cortical infarct. No extra-axial fluid collection. No evidence of intracranial mass. No midline shift. Vascular: No hyperdense vessel.  Atherosclerotic calcifications Skull: Normal. Negative for fracture or focal lesion. Sinuses/Orbits: Visualized orbits show no acute finding. No significant paranasal sinus disease at the imaged levels. ASPECTS ([place] Stroke Program Early CT Score) - Ganglionic level infarction (caudate, lentiform nuclei, internal capsule, insula,  M1-M3 cortex): 7 - Supraganglionic infarction (M4-M6 cortex): 3 Total score (0-10 with 10 being normal): 10 These results were communicated to [place] at 1:06 pmon 4/17/2022by text page via the [facility] messaging system. IMPRESSION: No evidence of acute intracranial abnormality. Severe, but nonspecific chronic cerebral white matter chronic small vessel ischemic disease. Electronically Signed   By: Kellie Simmering DO   On: 07/14/2020 13:06   CT ANGIO HEAD NECK W WO CM (CODE STROKE)  Result Date: 07/14/2020 CLINICAL DATA:  Stroke, follow-up. EXAM: CT HEAD WITHOUT CONTRAST CT ANGIOGRAPHY HEAD AND NECK TECHNIQUE: Multidetector CT imaging of the head and neck was performed using the standard protocol during bolus administration of intravenous contrast. Multiplanar CT image reconstructions and MIPs were obtained to evaluate the vascular anatomy. Carotid stenosis measurements (when applicable) are obtained utilizing NASCET criteria, using the distal internal carotid diameter as the denominator. Multiphase CT imaging of the brain was performed following IV bolus contrast injection. Subsequent parametric perfusion maps were calculated using RAPID software. CONTRAST:  80mL OMNIPAQUE IOHEXOL 350 MG/ML SOLN COMPARISON:  Noncontrast head CT performed earlier today. FINDINGS: CTA NECK FINDINGS Aortic arch: Standard aortic branching. Atherosclerotic plaque within the visualized aortic arch and proximal major branch vessels of the neck. No hemodynamically significant innominate or proximal subclavian artery stenosis. Right carotid system: CCA and ICA patent within the neck without stenosis. Mild atherosclerotic plaque within the carotid bifurcation. Left carotid system: CCA and ICA patent within the neck without stenosis. Mild atherosclerotic plaque within the carotid bifurcation. Vertebral arteries: Patent within the neck without stenosis. Left vertebral artery dominant. Skeleton: No acute bony abnormality or aggressive osseous  lesion. Cervical spondylosis. C4-C5 grade 1 anterolisthesis.  Other neck: No neck mass or cervical lymphadenopathy. Multiple thyroid nodules measuring up to 13 mm, not meeting consensus criteria for ultrasound follow-up based on size. Upper chest: No consolidation within the imaged lung apices Review of the MIP images confirms the above findings CTA HEAD FINDINGS Anterior circulation: The intracranial internal carotid arteries are patent. Calcified plaque within both vessels without stenosis. The M1 middle cerebral arteries are patent. The anterior cerebral arteries are patent. No intracranial aneurysm is identified. Posterior circulation: The intracranial vertebral arteries are patent. The basilar artery is patent. The posterior cerebral arteries are patent. Mild atherosclerotic stenosis of the proximal P2 left PCA. Posterior communicating arteries are hypoplastic or absent bilaterally. Venous sinuses: Within the limitations of contrast timing, no convincing thrombus. Anatomic variants: As described Review of the MIP images confirms the above findings These results were communicated to [place] [facility] [place] 4/17/2022by text page via the [facility] messaging system. IMPRESSION: CTA neck: The common carotid, internal carotid and vertebral arteries are patent within the neck without hemodynamically significant stenosis. Mild atherosclerotic disease within the carotid systems, as described. CTA head: 1. No intracranial large vessel occlusion or proximal high-grade arterial stenosis. 2. Mild stenosis within the proximal P2 segment of the left posterior cerebral artery. Electronically Signed   By: Kellie Simmering DO   On: 07/14/2020 13:36    EKG: Independently reviewed.  NSR with rate 57; nonspecific ST changes with no evidence of acute ischemia   Labs on Admission: I have personally reviewed the available labs and imaging studies at the time of the admission.  Pertinent labs:   Glucose 108 BUN 21/Creatinine 0.99/GFR  56 WBC 18.9 INR 1.1   Assessment/Plan Principal Problem:   Stroke-like symptoms Active Problems:   Hyperlipidemia   Major depressive disorder, recurrent episode, moderate (HCC)   PAF (paroxysmal atrial fibrillation) (HCC)   HTN (hypertension)   DNR (do not resuscitate)   Rheumatoid arthritis ([place])   Concern for CVA -Patient with prior CVA (2014) presenting with mild R-sided weakness and aphasia -Concerning for TIA/CVA -ABCD2 score is 6 -Aspirin has been given to reduce stroke mortality and decrease morbidity -Planned to place in observation status for CVA/TIA evaluatio with telemetry monitoring -MRI negative for acute CVA -Neurology consult -After further discussion with Dr. Leonel Ramsay, he thinks this likely was a TIA but since she is anticoagulated he recommends d/c to home at this time.  HTN -Allow permissive HTN for now -Treat BP only if >220/120, and then with goal of 15% reduction -Hold Diltiazem, Losartan, metoprolol and plan to restart in 48-72 hours -Hold Lasix   HLD -Check FLP -Start Lipitor 40 mg daily empirically   Sundowning/depression -Family reports significant issues during hospitalizations -Care order written to allow family to stay overnight with patient -Continue Lexapro  RA -Continue 10 mg prednisone PO daily  Afib -Usually rate controlled with Dilt and Lopressor - but these are on hold for now so will need to monitor rate -Continue Eliquis  CAD -s/p stent  Stage 3a CKD -Appears to be at/near baseline at this time  Chronic pain  -I have reviewed this patient in the [place] Controlled Substances Reporting System.  She is receiving medications from only one provider and appears to be taking them as prescribed. -She is at particularly high risk of opioid misuse, diversion, or overdose. -Continue Fentanyl patch and prn Norco 10/325  DNR -I have discussed code status with the patient and her daughters and  they are in agreement that the patient  would not desire resuscitation and would prefer to die a natural death should that situation arise. -She will need a gold out of facility DNR form at the time of discharge    Note: This patient has been tested and is pending for the novel coronavirus COVID-19. She has been fully vaccinated against COVID-19.   *Thank you for this interesting consult.  Given neurology's recommendation to d/c the patient to home and the patient's h/o significant sundowning while hospitalized, will rescind the admission order at this time.  Karmen Bongo MD [facility]   How to contact the [facility] Attending or Consulting provider [place] or covering provider during after hours [place], for this patient?  1. Check the care team in [facility] and look for a) attending/consulting [facility] provider listed and b) the [facility] team listed 2. Log into [email or web address] and use [place]'s universal password to access. If you do not have the password, please contact the hospital operator. 3. Locate the [facility] provider you are looking for under [facility] and page to a number that you can be directly reached. 4. If you still have difficulty reaching the provider, please page the [facility] (Director on Call) for the Hospitalists listed on amion for assistance.   07/14/2020, 3:28 PM

## 2020-07-14 NOTE — ED Triage Notes (Signed)
Pt arrived by EMS from home complaining of slurred speech, some right sided weakness and aphasia.   On arrival weakness resolved but pt still having aphasia  Pt denies pain at this time States that she is on elaquis for A.fib

## 2020-07-14 NOTE — Discharge Instructions (Signed)
Continue your medications.  Follow-up with [facility] neurology.  They should call you for an appointment.  However call them to confirm.  Please return if symptoms worsen.

## 2020-07-15 ENCOUNTER — Ambulatory Visit (HOSPITAL_BASED_OUTPATIENT_CLINIC_OR_DEPARTMENT_OTHER): Payer: Medicare Other | Admitting: Physical Therapy

## 2020-07-15 NOTE — Progress Notes (Signed)
Subjective:   Betty Jackson is a 86 y.o. female who presents for Medicare Annual (Subsequent) preventive examination  I connected with Tanette Chauca today by telephone and verified that I am speaking with the correct person using two identifiers. Location patient: home Location provider: work Persons participating in the virtual visit: patient, provider.   I discussed the limitations, risks, security and privacy concerns of performing an evaluation and management service by telephone and the availability of in person appointments. I also discussed with the patient that there may be a patient responsible charge related to this service. The patient expressed understanding and verbally consented to this telephonic visit.    Interactive audio and video telecommunications were attempted between this provider and patient, however failed, due to patient having technical difficulties OR patient did not have access to video capability.  We continued and completed visit with audio only.    Review of Systems    n/a       Objective:    There were no vitals filed for this visit. There is no height or weight on file to calculate BMI.  Advanced Directives 05/13/2020 10/26/2019 10/25/2019 10/17/2019 10/17/2019 09/25/2019 09/25/2019  Does Patient Have a Medical Advance Directive? Yes Yes Yes Yes Yes Yes Yes  Type of Paramedic of [place];Living will [place];Living will - [place];Living will Living will;Healthcare Power of Attorney Living will;Healthcare Power of [place];Living will  Does patient want to make changes to medical advance directive? - No - Patient declined No - Patient declined - No - Patient declined No - Patient declined -  Copy of [place] in Chart? - - - No - copy requested - - -  Would patient like information on creating a medical advance directive? - - - - - - -  Pre-existing  out of facility DNR order (yellow form or pink MOST form) - - - - - - -    Current Medications (verified) Outpatient Encounter Medications as of 07/16/2020  Medication Sig  . acetaminophen (TYLENOL) 650 MG CR tablet Take 1,300 mg by mouth daily as needed for pain.  Marland Kitchen alendronate (FOSAMAX) 70 MG tablet Take 1 tablet (70 mg total) by mouth every 7 (seven) days. Take with a full glass of water on an empty stomach. (Patient taking differently: Take 70 mg by mouth every Thursday. Take with a full glass of water on an empty stomach.)  . ALPRAZolam (XANAX) 0.5 MG tablet Take 1 tablet (0.5 mg total) by mouth at bedtime as needed for sleep.  . Ascorbic Acid (VITAMIN C PO) Take 1 tablet by mouth daily with supper.  . Calcium Carbonate Antacid (TUMS PO) Take 1 tablet by mouth daily with supper.  . cholecalciferol (VITAMIN D) 25 MCG tablet Take 2 tablets (2,000 Units total) by mouth 2 (two) times daily. (Patient taking differently: Take 2,000 Units by mouth daily with supper.)  . diltiazem (CARDIZEM CD) 240 MG 24 hr capsule Take 1 capsule (240 mg total) by mouth daily. (Patient taking differently: Take 240 mg by mouth daily with supper.)  . diphenhydrAMINE (BENADRYL) 25 MG tablet Take 25 mg by mouth See admin instructions. Take one tablet (25 mg) by mouth twice during the night as needed for coughing.  . docusate sodium (COLACE) 100 MG capsule Take 100 mg by mouth daily as needed for mild constipation.  Marland Kitchen ELIQUIS 5 MG TABS tablet TAKE 1 TABLET BY MOUTH TWICE A  DAY (Patient taking differently: Take 5 mg by mouth 2 (two) times daily with a meal.)  . escitalopram (LEXAPRO) 10 MG tablet Take 1 tablet (10 mg total) by mouth daily.  . fentaNYL (DURAGESIC) 25 MCG/HR Place 1 patch onto the skin every 3 (three) days.  . ferrous sulfate 325 (65 FE) MG tablet Take 1 tablet (325 mg total) by mouth 2 (two) times daily with a meal. (Patient taking differently: Take 325 mg by mouth daily with supper.)  . furosemide (LASIX)  40 MG tablet TAKE 1 TABLET BY MOUTH EVERY DAY (Patient taking differently: Take 40 mg by mouth daily.)  . HYDROcodone-acetaminophen (NORCO) 10-325 MG tablet Take one tablet by mouth every 8 hours as needed for breakthrough pain. (Patient taking differently: Take 1 tablet by mouth every 8 (eight) hours as needed (breakthrough pain).)  . LACTOBACILLUS PO Take 1 capsule by mouth daily with supper.  . losartan (COZAAR) 50 MG tablet Take 1 tablet (50 mg total) by mouth daily. (Patient taking differently: Take 50 mg by mouth daily with supper.)  . MAGNESIUM PO Take 1 tablet by mouth 2 (two) times a week.  . metoprolol tartrate (LOPRESSOR) 100 MG tablet Take 50 mg by mouth 2 (two) times daily with a meal.  . Multiple Vitamin (MULTIVITAMIN WITH MINERALS) TABS tablet Take 1 tablet by mouth daily.  . potassium chloride (KLOR-CON) 10 MEQ tablet Take 1 tablet (10 mEq total) by mouth daily. (Patient taking differently: Take 10 mEq by mouth daily with supper.)  . predniSONE (DELTASONE) 10 MG tablet Take 1 tablet (10 mg total) by mouth daily with breakfast.  . senna-docusate (SENOKOT-S) 8.6-50 MG tablet Take 1 tablet by mouth every other day.  Marland Kitchen VITAMIN E PO Take 1 capsule by mouth daily with supper.   No facility-administered encounter medications on file as of 07/16/2020.    Allergies (verified) Dextromethorphan   History: Past Medical History:  Diagnosis Date  . Acute respiratory failure with hypoxia ([place]) 05/20/2017  . Anemia, unspecified 10/28/2012  . Anxiety state, unspecified 10/28/2012  . CAD (coronary artery disease)    a. Stent RCA in [facility];  b. Cath approx 2009 - nonobs per pt report.  . Cataract    immature on the left eye  . Chronic insomnia 02/07/2013  . Chronic lower back pain    scoliosis  . CKD (chronic kidney disease) stage 3, GFR 30-59 ml/min (HCC)   . CVA (cerebral infarction) 10/29/2012  . DDD (degenerative disc disease)   . Diverticulosis   . GERD (gastroesophageal  reflux disease) 09/01/2010  . Hemorrhoids   . Herniated nucleus pulposus, L5-S1, right 11/04/2015  . Hyperlipidemia    takes Lipitor daily  . Hypertension    takes Amlodipine,Losartan,Metoprolol,and HCTZ daily  . Incontinence of urine   . Insomnia    takes Restoril nightly  . Lumbar stenosis 04/25/2013  . Major depressive disorder, recurrent episode, moderate ([place]) 07/16/2013  . Osteoporosis   . PAF (paroxysmal atrial fibrillation) ([place]) 2011   a. lone epidode in 2011 according to notes.  . Rheumatoid arthritis ([place])   . Scoliosis   . Sepsis ([place]) 05/2019  . Temporal arteritis ([place]) 2011   a. followed @ [facility]; potential flareup 10/28/2012/notes 10/28/2012  . Vocal cord dysfunction    "they don't operate properly" (10/28/2012)   Past Surgical History:  Procedure Laterality Date  . ABDOMINAL HYSTERECTOMY  ~ 1984   vaginally  . BACK SURGERY  7-85yrs ago   X Stop  .  BLADDER SUSPENSION  2001  . BREAST BIOPSY Right   . CATARACT EXTRACTION W/ INTRAOCULAR LENS IMPLANT Right ~ 08/2012  . CHEST TUBE INSERTION Left 03/08/2019   Procedure: Chest Tube Insertion;  Surgeon: Ivin Poot, MD;  Location: [facility];  Service: Thoracic;  Laterality: Left;  . COLONOSCOPY  01/26/2012   Procedure: COLONOSCOPY;  Surgeon: Ladene Artist, MD,FACG;  Location[facility] ENDOSCOPY;  Service: Endoscopy;  Laterality: N/A;  note the EGD is possible  . CORONARY ANGIOPLASTY WITH STENT PLACEMENT  2006   X 1 stent  . EPIDURAL BLOCK INJECTION    . ESOPHAGOGASTRODUODENOSCOPY  01/26/2012   Procedure: ESOPHAGOGASTRODUODENOSCOPY (EGD);  Surgeon: Ladene Artist, MD,FACG;  [facility];  Service: Endoscopy;  Laterality: N/A;  . FINE NEEDLE ASPIRATION Right 09/26/2019   Procedure: FINE NEEDLE ASPIRATION;  Surgeon: Altamese Tivoli, MD;  Location: [facility];  Service: Orthopedics;  Laterality: Right;  . HEMIARTHROPLASTY HIP Right 2012  . IR EPIDUROGRAPHY  04/20/2019  . LAPAROSCOPIC CHOLECYSTECTOMY  2001  . LUMBAR FUSION  03/2013  .  LUMBAR LAMINECTOMY/DECOMPRESSION MICRODISCECTOMY Right 11/04/2015   Procedure: Right Lumbar Five-Sacral One Microdiskectomy;  Surgeon: Kristeen Miss, MD;  Location: [facility] NEURO ORS;  Service: Neurosurgery;  Laterality: Right;  Right L5-S1 Microdiskectomy  . moles removed that required stiches     one on leg and one on face  . ORIF FEMUR FRACTURE Right 09/26/2019   Procedure: OPEN REDUCTION INTERNAL FIXATION (ORIF) DISTAL FEMUR FRACTURE;  Surgeon: Altamese Farwell, MD;  Location: [facility];  Service: Orthopedics;  Laterality: Right;  . ORIF FEMUR FRACTURE Right 10/17/2019   Procedure: OPEN REDUCTION INTERNAL FIXATION (ORIF) DISTAL FEMUR FRACTURE;  Surgeon: Altamese [place], MD;  Location: [facility] [facility];  Service: Orthopedics;  Laterality: Right;  . SUBXYPHOID PERICARDIAL WINDOW N/A 03/08/2019   Procedure: SUBXYPHOID PERICARDIAL WINDOW;  Surgeon: Ivin Poot, MD;  Location: [facility];  Service: Thoracic;  Laterality: N/A;  . TEE WITHOUT CARDIOVERSION N/A 03/08/2019   Procedure: TRANSESOPHAGEAL ECHOCARDIOGRAM (TEE);  Surgeon: Prescott Gum, Collier Salina, MD;  Location: [facility];  Service: Thoracic;  Laterality: N/A;  . TEMPORAL ARTERY BIOPSY / LIGATION Bilateral 2011  . TONSILLECTOMY AND ADENOIDECTOMY     at age 89  . TUBAL LIGATION  ~ 1982  . X-STOP IMPLANTATION  ~ 2010   "lower back" (10/28/2012)   Family History  Problem Relation Age of Onset  . Brain cancer Mother 33  . Heart attack Father   . Breast cancer Daughter 23  . Heart disease Other   . Hypertension Other   . Arthritis Neg Hx   . Colon cancer Neg Hx   . Osteoporosis Neg Hx    Social History   Socioeconomic History  . Marital status: Married    Spouse name: Not on file  . Number of children: 3  . Years of education: Not on file  . Highest education level: Not on file  Occupational History  . Occupation: Retired Cabin crew  Tobacco Use  . Smoking status: Never Smoker  . Smokeless tobacco: Never Used  Vaping Use  . Vaping Use: Never used  Substance and  Sexual Activity  . Alcohol use: Yes    Comment: socially  . Drug use: No  . Sexual activity: Not on file  Other Topics Concern  . Not on file  Social History Narrative   Lives in [place] with her husband.  Retired Cabin crew.   Social Determinants of Health   Financial Resource Strain: Not on file  Food Insecurity:  Not on file  Transportation Needs: Not on file  Physical Activity: Not on file  Stress: Not on file  Social Connections: Not on file    Tobacco Counseling Counseling given: Not Answered   Clinical Intake:                 Diabetic?no         Activities of Daily Living In your present state of health, do you have any difficulty performing the following activities: 10/26/2019 10/17/2019  Hearing? N N  Vision? N N  Difficulty concentrating or making decisions? Tempie Donning  Walking or climbing stairs? Y Y  Dressing or bathing? Y Y  Doing errands, shopping? Y N  Some recent data might be hidden    Patient Care Team: Eulas Post, MD as PCP - General (Family Medicine) Burnell Blanks, MD as PCP - Cardiology (Cardiology) Lynwood Dawley, MD as Physician Assistant (Internal Medicine)  Indicate any recent Medical Services you may have received from other than [facility] providers in the past year (date may be approximate).     Assessment:   This is a routine wellness examination for Betty Jackson.  Hearing/Vision screen No exam data present  Dietary issues and exercise activities discussed:    Goals   None    Depression Screen PHQ 2/9 Scores 01/01/2016 06/23/2013 01/10/2013  PHQ - 2 Score 0 2 0    Fall Risk Fall Risk  02/16/2018 10/27/2017 01/01/2016 06/23/2013  Falls in the past year? 0 No No No  Comment Emmi Telephone Survey: data to providers prior to load Emmi Telephone Survey: data to providers prior to load - -    FALL RISK PREVENTION PERTAINING TO THE HOME:  Any stairs in or around the home? Yes  If so, are there any without handrails?  Yes  Home free of loose throw rugs in walkways, pet beds, electrical cords, etc? Yes  Adequate lighting in your home to reduce risk of falls? Yes   ASSISTIVE DEVICES UTILIZED TO PREVENT FALLS:  Life alert? No  Use of a cane, walker or w/c? Yes  Grab bars in the bathroom? No  Shower chair or bench in shower? No  Elevated toilet seat or a handicapped toilet? No   cognitive Function:   Cognitive status assessed by direct observation. Patient has current diagnosis of cognitive impairment. Patient is followed by neurology for ongoing assessment. Patient is unable to complete screening 6CIT or MMSE.         Immunizations Immunization History  Administered Date(s) Administered  . DTaP 04/03/2007  . Fluad Quad(high Dose 65+) 12/14/2018, 12/12/2019  . Influenza Split 01/06/2011, 01/27/2012, 12/22/2012  . Influenza, High Dose Seasonal PF 01/11/2018  . Influenza,inj,Quad PF,6+ Mos 12/25/2013  . Influenza,inj,quad, With Preservative 12/27/2015  . Influenza-Unspecified 12/29/2014  . Pneumococcal Conjugate-13 04/02/2008  . Td 10/17/2014    TDAP status: Up to date  Flu Vaccine status: Up to date  Pneumococcal vaccine status: Up to date  Covid-19 vaccine status: Completed vaccines  Qualifies for Shingles Vaccine? Yes   Zostavax completed No   Shingrix Completed?: No.    Education has been provided regarding the importance of this vaccine. Patient has been advised to call insurance company to determine out of pocket expense if they have not yet received this vaccine. Advised may also receive vaccine at local pharmacy or Health Dept. Verbalized acceptance and understanding.  Screening Tests Health Maintenance  Topic Date Due  . COVID-19 Vaccine (1) Never done  . FOOT EXAM  Never done  . OPHTHALMOLOGY EXAM  Never done  . DEXA SCAN  Never done  . PNA vac Low Risk Adult (2 of 2 - PPSV23) 04/02/2009  . HEMOGLOBIN A1C  09/05/2019  . INFLUENZA VACCINE  10/28/2020  . TETANUS/TDAP   10/16/2024  . HPV VACCINES  Aged Out    Health Maintenance  Health Maintenance Due  Topic Date Due  . COVID-19 Vaccine (1) Never done  . FOOT EXAM  Never done  . OPHTHALMOLOGY EXAM  Never done  . DEXA SCAN  Never done  . PNA vac Low Risk Adult (2 of 2 - PPSV23) 04/02/2009  . HEMOGLOBIN A1C  09/05/2019    Colorectal cancer screening: No longer required.   Mammogram status: No longer required due to age.  Bone Density status: Ordered not completed pt declined . Pt provided with contact info and advised to call to schedule appt.  Lung Cancer Screening: (Low Dose CT Chest recommended if Age 45-80 years, 30 pack-year currently smoking OR have quit w/in 15years.) does not qualify.   Lung Cancer Screening Referral: n/a  Additional Screening:  Hepatitis C Screening: does not qualify  Vision Screening: Recommended annual ophthalmology exams for early detection of glaucoma and other disorders of the eye. Is the patient up to date with their annual eye exam?  Yes  Who is the provider or what is the name of the office in which the patient attends annual eye exams? Dr.Digby  If pt is not established with a provider, would they like to be referred to a provider to establish care? No .   Dental Screening: Recommended annual dental exams for proper oral hygiene  Community Resource Referral / Chronic Care Management: CRR required this visit?  No   CCM required this visit?  No      Plan:     I have personally reviewed and noted the following in the patient's chart:   . Medical and social history . Use of alcohol, tobacco or illicit drugs  . Current medications and supplements . Functional ability and status . Nutritional status . Physical activity . Advanced directives . List of other physicians . Hospitalizations, surgeries, and ER visits in previous 12 months . Vitals . Screenings to include cognitive, depression, and falls . Referrals and appointments  In addition, I  have reviewed and discussed with patient certain preventive protocols, quality metrics, and best practice recommendations. A written personalized care plan for preventive services as well as general preventive health recommendations were provided to patient.     Randel Pigg, LPN   [id number]/61/[id number]   Nurse Notes: none

## 2020-07-16 ENCOUNTER — Emergency Department (HOSPITAL_COMMUNITY): Payer: Medicare Other

## 2020-07-16 ENCOUNTER — Emergency Department (HOSPITAL_COMMUNITY)
Admission: EM | Admit: 2020-07-16 | Discharge: 2020-07-17 | Disposition: A | Discharge status: Home / Self Care | Payer: Medicare Other | Attending: Emergency Medicine | Admitting: Emergency Medicine

## 2020-07-16 ENCOUNTER — Encounter: Payer: Self-pay | Admitting: Family Medicine

## 2020-07-16 ENCOUNTER — Ambulatory Visit (INDEPENDENT_AMBULATORY_CARE_PROVIDER_SITE_OTHER): Payer: Medicare Other

## 2020-07-16 ENCOUNTER — Encounter (HOSPITAL_COMMUNITY): Payer: Self-pay

## 2020-07-16 DIAGNOSIS — R404 Transient alteration of awareness: Secondary | ICD-10-CM | POA: Diagnosis not present

## 2020-07-16 DIAGNOSIS — R4182 Altered mental status, unspecified: Secondary | ICD-10-CM | POA: Insufficient documentation

## 2020-07-16 DIAGNOSIS — I2510 Atherosclerotic heart disease of native coronary artery without angina pectoris: Secondary | ICD-10-CM | POA: Insufficient documentation

## 2020-07-16 DIAGNOSIS — Z79899 Other long term (current) drug therapy: Secondary | ICD-10-CM | POA: Diagnosis not present

## 2020-07-16 DIAGNOSIS — I130 Hypertensive heart and chronic kidney disease with heart failure and stage 1 through stage 4 chronic kidney disease, or unspecified chronic kidney disease: Secondary | ICD-10-CM | POA: Insufficient documentation

## 2020-07-16 DIAGNOSIS — M19011 Primary osteoarthritis, right shoulder: Secondary | ICD-10-CM | POA: Diagnosis not present

## 2020-07-16 DIAGNOSIS — M810 Age-related osteoporosis without current pathological fracture: Secondary | ICD-10-CM | POA: Diagnosis not present

## 2020-07-16 DIAGNOSIS — Z515 Encounter for palliative care: Secondary | ICD-10-CM

## 2020-07-16 DIAGNOSIS — R6889 Other general symptoms and signs: Secondary | ICD-10-CM | POA: Diagnosis not present

## 2020-07-16 DIAGNOSIS — R197 Diarrhea, unspecified: Secondary | ICD-10-CM | POA: Insufficient documentation

## 2020-07-16 DIAGNOSIS — R1110 Vomiting, unspecified: Secondary | ICD-10-CM | POA: Insufficient documentation

## 2020-07-16 DIAGNOSIS — R451 Restlessness and agitation: Secondary | ICD-10-CM | POA: Diagnosis not present

## 2020-07-16 DIAGNOSIS — Z955 Presence of coronary angioplasty implant and graft: Secondary | ICD-10-CM | POA: Insufficient documentation

## 2020-07-16 DIAGNOSIS — Z9189 Other specified personal risk factors, not elsewhere classified: Secondary | ICD-10-CM

## 2020-07-16 DIAGNOSIS — Z0000 Encounter for general adult medical examination without abnormal findings: Secondary | ICD-10-CM

## 2020-07-16 DIAGNOSIS — I499 Cardiac arrhythmia, unspecified: Secondary | ICD-10-CM | POA: Diagnosis not present

## 2020-07-16 DIAGNOSIS — M19012 Primary osteoarthritis, left shoulder: Secondary | ICD-10-CM | POA: Diagnosis not present

## 2020-07-16 DIAGNOSIS — I5032 Chronic diastolic (congestive) heart failure: Secondary | ICD-10-CM | POA: Insufficient documentation

## 2020-07-16 DIAGNOSIS — N183 Chronic kidney disease, stage 3 unspecified: Secondary | ICD-10-CM | POA: Diagnosis not present

## 2020-07-16 DIAGNOSIS — M549 Dorsalgia, unspecified: Secondary | ICD-10-CM | POA: Diagnosis not present

## 2020-07-16 DIAGNOSIS — Z743 Need for continuous supervision: Secondary | ICD-10-CM | POA: Diagnosis not present

## 2020-07-16 LAB — CBC WITH DIFFERENTIAL/PLATELET
Abs Immature Granulocytes: 0.11 10*3/uL — ABNORMAL HIGH (ref 0.00–0.07)
Basophils Absolute: 0 10*3/uL (ref 0.0–0.1)
Basophils Relative: 0 %
Eosinophils Absolute: 0 10*3/uL (ref 0.0–0.5)
Eosinophils Relative: 0 %
HCT: 43.7 % (ref 36.0–46.0)
Hemoglobin: 14.2 g/dL (ref 12.0–15.0)
Immature Granulocytes: 1 %
Lymphocytes Relative: 11 %
Lymphs Abs: 1.5 10*3/uL (ref 0.7–4.0)
MCH: 30.7 pg (ref 26.0–34.0)
MCHC: 32.5 g/dL (ref 30.0–36.0)
MCV: 94.6 fL (ref 80.0–100.0)
Monocytes Absolute: 1.1 10*3/uL — ABNORMAL HIGH (ref 0.1–1.0)
Monocytes Relative: 8 %
Neutro Abs: 10.2 10*3/uL — ABNORMAL HIGH (ref 1.7–7.7)
Neutrophils Relative %: 80 %
Platelets: 208 10*3/uL (ref 150–400)
RBC: 4.62 MIL/uL (ref 3.87–5.11)
RDW: 13.7 % (ref 11.5–15.5)
WBC: 12.8 10*3/uL — ABNORMAL HIGH (ref 4.0–10.5)
nRBC: 0 % (ref 0.0–0.2)

## 2020-07-16 LAB — COMPREHENSIVE METABOLIC PANEL
ALT: 25 U/L (ref 0–44)
AST: 23 U/L (ref 15–41)
Albumin: 3.6 g/dL (ref 3.5–5.0)
Alkaline Phosphatase: 57 U/L (ref 38–126)
Anion gap: 10 (ref 5–15)
BUN: 23 mg/dL (ref 8–23)
CO2: 26 mmol/L (ref 22–32)
Calcium: 8.7 mg/dL — ABNORMAL LOW (ref 8.9–10.3)
Chloride: 98 mmol/L (ref 98–111)
Creatinine, Ser: 0.95 mg/dL (ref 0.44–1.00)
GFR, Estimated: 59 mL/min — ABNORMAL LOW (ref >60)
Glucose, Bld: 100 mg/dL — ABNORMAL HIGH (ref 70–99)
Potassium: 4.3 mmol/L (ref 3.5–5.1)
Sodium: 134 mmol/L — ABNORMAL LOW (ref 135–145)
Total Bilirubin: 0.9 mg/dL (ref 0.3–1.2)
Total Protein: 6.4 g/dL — ABNORMAL LOW (ref 6.5–8.1)

## 2020-07-16 LAB — PROTIME-INR
INR: 1.2 (ref 0.8–1.2)
Prothrombin Time: 15.2 seconds (ref 11.4–15.2)

## 2020-07-16 LAB — AMMONIA: Ammonia: 17 umol/L (ref 9–35)

## 2020-07-16 LAB — LIPASE, BLOOD: Lipase: 41 U/L (ref 11–51)

## 2020-07-16 LAB — BRAIN NATRIURETIC PEPTIDE: B Natriuretic Peptide: 96.2 pg/mL (ref 0.0–100.0)

## 2020-07-16 LAB — TROPONIN I (HIGH SENSITIVITY): Troponin I (High Sensitivity): 11 ng/L (ref <18)

## 2020-07-16 LAB — LACTIC ACID, PLASMA: Lactic Acid, Venous: 1.6 mmol/L (ref 0.5–1.9)

## 2020-07-16 MED ORDER — SODIUM CHLORIDE 0.9 % IV BOLUS
1000.0000 mL | Freq: Once | INTRAVENOUS | Status: AC
  Administered 2020-07-16: 1000 mL via INTRAVENOUS

## 2020-07-16 NOTE — ED Provider Notes (Signed)
[facility] EMERGENCY DEPARTMENT Provider Note   CSN: [id number] Arrival date & time: 07/16/20  2042     History Chief Complaint  Patient presents with  . Altered Mental Status    Chronic confusion since Sunday noted by family per medic report.     ZAIDEE RION is a 86 y.o. female.  HPI Patient had work-up 2 days ago for stroke presentation.  Ultimately no acute stroke identified.  Patient's daughter reports that they went back home and the following day patient had agitation and vomiting and diarrhea.  Her daughter ultimately recognized that her fentanyl patch had been removed for MRI.  She felt the symptoms may be due to withdrawal and was able to replace the fentanyl patch and give several doses of Vicodin with some improvement.  Patient daughter reports that the patient has been restless at night and has not got a lot of sleep.  She did take a longer nap this afternoon.  However when she got up she seemed confused again and her speech was not quite right.  She was not following commands well.  Patient's daughter reports that she was concerned that maybe she had a UTI or something had been missed.  So she brought her back for reevaluation.  I evaluated the patient prior to her daughter arriving.  Patient reports that she did not think anything it really changed that much today.  She reports that her daughter was concerned and that was the main thing that got her to the hospital today.  She does report she has some chronic problems with back and hip pain.  She denies chest pain or shortness of breath.  She denies abdominal pain.  Past Medical History:  Diagnosis Date  . Acute respiratory failure with hypoxia ([place]) 05/20/2017  . Anemia, unspecified 10/28/2012  . Anxiety state, unspecified 10/28/2012  . CAD (coronary artery disease)    a. Stent RCA in [facility];  b. Cath approx 2009 - nonobs per pt report.  . Cataract    immature on the left eye  . Chronic insomnia  02/07/2013  . Chronic lower back pain    scoliosis  . CKD (chronic kidney disease) stage 3, GFR 30-59 ml/min (HCC)   . CVA (cerebral infarction) 10/29/2012  . DDD (degenerative disc disease)   . Diverticulosis   . GERD (gastroesophageal reflux disease) 09/01/2010  . Hemorrhoids   . Herniated nucleus pulposus, L5-S1, right 11/04/2015  . Hyperlipidemia    takes Lipitor daily  . Hypertension    takes Amlodipine,Losartan,Metoprolol,and HCTZ daily  . Incontinence of urine   . Insomnia    takes Restoril nightly  . Lumbar stenosis 04/25/2013  . Major depressive disorder, recurrent episode, moderate ([place]) 07/16/2013  . Osteoporosis   . PAF (paroxysmal atrial fibrillation) ([place]) 2011   a. lone epidode in 2011 according to notes.  . Rheumatoid arthritis ([place])   . Scoliosis   . Sepsis ([place]) 05/2019  . Temporal arteritis ([place]) 2011   a. followed @ [facility]; potential flareup 10/28/2012/notes 10/28/2012  . Vocal cord dysfunction    "they don't operate properly" (10/28/2012)    Patient Active Problem List   Diagnosis Date Noted  . Stroke-like symptoms 07/14/2020  . Cough 05/24/2020  . Dyspnea 05/24/2020  . Optic neuritis 05/24/2020  . Osteopenia 05/24/2020  . Rash 05/24/2020  . Vitamin D deficiency 05/24/2020  . Palliative care by specialist   . Goals of care, counseling/discussion   . DNR (do not resuscitate)   .  Weakness   . Advanced directives, counseling/discussion   . Pressure injury of skin 10/18/2019  . Closed fracture of right distal femur ([place]) 10/17/2019  . Femur fracture, right ([place]) 09/25/2019  . Chronic diastolic CHF (congestive heart failure) ([place]) 09/25/2019  . HCAP (healthcare-associated pneumonia) 06/19/2019  . Encephalopathy 06/19/2019  . Lumbar degenerative disc disease   . Paroxysmal A-fib ([place])   . Hypomagnesemia   . C. difficile colitis   . Diarrhea 05/29/2019  . CHF (congestive heart failure) ([place]) 05/29/2019  . Recurrent falls 04/30/2019  . Pericardial effusion  without cardiac tamponade 03/29/2019  . Hypoalbuminemia 03/27/2019  . Cardiac tamponade   . Pericarditis 03/06/2019  . Fever   . Septic shock ([place])   . Atypical chest pain   . Atrial fibrillation with rapid ventricular response ([place])   . Nausea 01/17/2019  . Acute diverticulitis 01/13/2019  . Chronic back pain 01/13/2019  . Tachycardia 01/13/2019  . Hypokalemia 01/13/2019  . Chronic low back pain 07/10/2018  . Influenza A 05/20/2017  . Severe sepsis ([place]) 05/20/2017  . Acute respiratory failure with hypoxia ([place]) 05/20/2017  . CKD (chronic kidney disease), stage III ([place]) 05/20/2017  . PAF (paroxysmal atrial fibrillation) ([place]) 05/20/2017  . HTN (hypertension) 05/20/2017  . Giant cell arteritis ([place]) 05/20/2017  . Coronary disease 05/20/2017  . Abnormal urinalysis 05/20/2017  . Osteoporosis 05/08/2016  . Herniated nucleus pulposus, L5-S1, right 11/04/2015  . Major depressive disorder, recurrent episode, moderate ([place]) 07/16/2013  . Abdominal pain, epigastric 06/22/2013  . Lumbar stenosis 04/25/2013  . Chronic insomnia 02/07/2013  . Elevated transaminase level 02/07/2013  . CVA (cerebral infarction) 10/29/2012  . Visual changes 10/28/2012  . Depression 10/28/2012  . Anxiety state 10/28/2012  . Anemia, unspecified 10/28/2012  . Nonspecific (abnormal) findings on radiological and other examination of gastrointestinal tract 01/25/2012  . Hyponatremia 01/24/2012  . Hematuria 01/24/2012  . Enteritis 12/24/2011  . Abdominal pain, other specified site 12/24/2011  . Leg pain, right 02/18/2011  . Rheumatoid arthritis ([place]) 02/04/2011  . Temporal arteritis ([place]) 09/01/2010  . Hypertension 09/01/2010  . Hyperlipidemia 09/01/2010  . History of atrial fibrillation 09/01/2010  . CAD (coronary artery disease) 09/01/2010  . GERD (gastroesophageal reflux disease) 09/01/2010  . Insomnia 09/01/2010    Past Surgical History:  Procedure Laterality Date  . ABDOMINAL HYSTERECTOMY  ~ 1984    vaginally  . BACK SURGERY  7-65yrs ago   X Stop  . BLADDER SUSPENSION  2001  . BREAST BIOPSY Right   . CATARACT EXTRACTION W/ INTRAOCULAR LENS IMPLANT Right ~ 08/2012  . CHEST TUBE INSERTION Left 03/08/2019   Procedure: Chest Tube Insertion;  Surgeon: Ivin Poot, MD;  Location: [place];  Service: Thoracic;  Laterality: Left;  . COLONOSCOPY  01/26/2012   Procedure: COLONOSCOPY;  Surgeon: Ladene Artist, MD,FACG;  [facility] ENDOSCOPY;  Service: Endoscopy;  Laterality: N/A;  note the EGD is possible  . CORONARY ANGIOPLASTY WITH STENT PLACEMENT  2006   X 1 stent  . EPIDURAL BLOCK INJECTION    . ESOPHAGOGASTRODUODENOSCOPY  01/26/2012   Procedure: ESOPHAGOGASTRODUODENOSCOPY (EGD);  Surgeon: Ladene Artist, MD,FACG;  Location: [facility] ENDOSCOPY;  Service: Endoscopy;  Laterality: N/A;  . FINE NEEDLE ASPIRATION Right 09/26/2019   Procedure: FINE NEEDLE ASPIRATION;  Surgeon: Altamese Pangburn, MD;  Location: [place];  Service: Orthopedics;  Laterality: Right;  . HEMIARTHROPLASTY HIP Right 2012  . IR EPIDUROGRAPHY  04/20/2019  . LAPAROSCOPIC CHOLECYSTECTOMY  2001  . LUMBAR FUSION  03/2013  .  LUMBAR LAMINECTOMY/DECOMPRESSION MICRODISCECTOMY Right 11/04/2015   Procedure: Right Lumbar Five-Sacral One Microdiskectomy;  Surgeon: Kristeen Miss, MD;  Location: [place] NEURO ORS;  Service: Neurosurgery;  Laterality: Right;  Right L5-S1 Microdiskectomy  . moles removed that required stiches     one on leg and one on face  . ORIF FEMUR FRACTURE Right 09/26/2019   Procedure: OPEN REDUCTION INTERNAL FIXATION (ORIF) DISTAL FEMUR FRACTURE;  Surgeon: Altamese [place], MD;  Location: [place];  Service: Orthopedics;  Laterality: Right;  . ORIF FEMUR FRACTURE Right 10/17/2019   Procedure: OPEN REDUCTION INTERNAL FIXATION (ORIF) DISTAL FEMUR FRACTURE;  Surgeon: Altamese [place]uk, MD;  Location: [place];  Service: Orthopedics;  Laterality: Right;  . SUBXYPHOID PERICARDIAL WINDOW N/A 03/08/2019   Procedure: SUBXYPHOID PERICARDIAL WINDOW;   Surgeon: Ivin Poot, MD;  Location: [place];  Service: Thoracic;  Laterality: N/A;  . TEE WITHOUT CARDIOVERSION N/A 03/08/2019   Procedure: TRANSESOPHAGEAL ECHOCARDIOGRAM (TEE);  Surgeon: Prescott Gum, Collier [place], MD;  Location: [place];  Service: Thoracic;  Laterality: N/A;  . TEMPORAL ARTERY BIOPSY / LIGATION Bilateral 2011  . TONSILLECTOMY AND ADENOIDECTOMY     at age 47  . TUBAL LIGATION  ~ 1982  . X-STOP IMPLANTATION  ~ 2010   "lower back" (10/28/2012)     OB History   No obstetric history on file.     Family History  Problem Relation Age of Onset  . Brain cancer Mother 22  . Heart attack Father   . Breast cancer Daughter 86  . Heart disease Other   . Hypertension Other   . Arthritis Neg Hx   . Colon cancer Neg Hx   . Osteoporosis Neg Hx     Social History   Tobacco Use  . Smoking status: Never Smoker  . Smokeless tobacco: Never Used  Vaping Use  . Vaping Use: Never used  Substance Use Topics  . Alcohol use: Yes    Comment: socially  . Drug use: No    Home Medications Prior to Admission medications   Medication Sig Start Date End Date Taking? Authorizing Provider  acetaminophen (TYLENOL) 650 MG CR tablet Take 1,300 mg by mouth daily as needed for pain.    [provider]  alendronate (FOSAMAX) 70 MG tablet Take 1 tablet (70 mg total) by mouth every 7 (seven) days. Take with a full glass of water on an empty stomach. Patient taking differently: Take 70 mg by mouth every Thursday. Take with a full glass of water on an empty stomach. 06/14/20   Burchette, Alinda Sierras, MD  ALPRAZolam Duanne Moron) 0.5 MG tablet Take 1 tablet (0.5 mg total) by mouth at bedtime as needed for sleep. 01/19/20   Burchette, Alinda Sierras, MD  Ascorbic Acid (VITAMIN C PO) Take 1 tablet by mouth daily with supper.    [provider]  Calcium Carbonate Antacid (TUMS PO) Take 1 tablet by mouth daily with supper.    [provider]  cholecalciferol (VITAMIN D) 25 MCG tablet Take 2 tablets  (2,000 Units total) by mouth 2 (two) times daily. Patient taking differently: Take 2,000 Units by mouth daily with supper. 09/29/19   Ainsley Spinner, PA-C  diltiazem (CARDIZEM CD) 240 MG 24 hr capsule Take 1 capsule (240 mg total) by mouth daily. Patient taking differently: Take 240 mg by mouth daily with supper. 03/28/20   Burnell Blanks, MD  diphenhydrAMINE (BENADRYL) 25 MG tablet Take 25 mg by mouth See admin instructions. Take one tablet (25 mg) by mouth twice  during the night as needed for coughing.    [provider]  docusate sodium (COLACE) 100 MG capsule Take 100 mg by mouth daily as needed for mild constipation.    [provider]  ELIQUIS 5 MG TABS tablet TAKE 1 TABLET BY MOUTH TWICE A DAY Patient taking differently: Take 5 mg by mouth 2 (two) times daily with a meal. 05/09/20   Burnell Blanks, MD  escitalopram (LEXAPRO) 10 MG tablet Take 1 tablet (10 mg total) by mouth daily. 02/09/20   Burchette, Alinda Sierras, MD  fentaNYL (DURAGESIC) 25 MCG/HR Place 1 patch onto the skin every 3 (three) days. 06/14/20   Burchette, Alinda Sierras, MD  ferrous sulfate 325 (65 FE) MG tablet Take 1 tablet (325 mg total) by mouth 2 (two) times daily with a meal. Patient taking differently: Take 325 mg by mouth daily with supper. 04/21/19   Oswald Hillock, MD  furosemide (LASIX) 40 MG tablet TAKE 1 TABLET BY MOUTH EVERY DAY Patient taking differently: Take 40 mg by mouth daily. 06/07/20   Burnell Blanks, MD  HYDROcodone-acetaminophen (NORCO) 10-325 MG tablet Take one tablet by mouth every 8 hours as needed for breakthrough pain. Patient taking differently: Take 1 tablet by mouth every 8 (eight) hours as needed (breakthrough pain). 07/05/20   Burchette, Alinda Sierras, MD  LACTOBACILLUS PO Take 1 capsule by mouth daily with supper.    [provider]  losartan (COZAAR) 50 MG tablet Take 1 tablet (50 mg total) by mouth daily. Patient taking differently: Take 50 mg by mouth daily with  supper. 05/24/20   Burchette, Alinda Sierras, MD  MAGNESIUM PO Take 1 tablet by mouth 2 (two) times a week.    [provider]  metoprolol tartrate (LOPRESSOR) 100 MG tablet Take 50 mg by mouth 2 (two) times daily with a meal.    [provider]  Multiple Vitamin (MULTIVITAMIN WITH MINERALS) TABS tablet Take 1 tablet by mouth daily. 10/21/19   Ainsley Spinner, PA-C  potassium chloride (KLOR-CON) 10 MEQ tablet Take 1 tablet (10 mEq total) by mouth daily. Patient taking differently: Take 10 mEq by mouth daily with supper. 06/07/20   Burchette, Alinda Sierras, MD  predniSONE (DELTASONE) 10 MG tablet Take 1 tablet (10 mg total) by mouth daily with breakfast. 10/30/19   Charlynne Cousins, MD  senna-docusate (SENOKOT-S) 8.6-50 MG tablet Take 1 tablet by mouth every other day.    [provider]  VITAMIN E PO Take 1 capsule by mouth daily with supper.    [provider]    Allergies    Dextromethorphan  Review of Systems   Review of Systems 10 systems reviewed and negative except as per HPI Physical Exam Updated Vital Signs BP (!) 139/54 (BP Location: Right Arm)   Pulse 85   Resp 18   SpO2 96%   Physical Exam Constitutional:      Comments: Patient is alert.  She is oriented to person and place.  No respiratory distress.  HENT:     Head: Normocephalic and atraumatic.     Mouth/Throat:     Mouth: Mucous membranes are moist.     Pharynx: Oropharynx is clear.  Eyes:     Extraocular Movements: Extraocular movements intact.  Cardiovascular:     Rate and Rhythm: Normal rate and regular rhythm.  Pulmonary:     Effort: Pulmonary effort is normal.     Breath sounds: Normal breath sounds.  Abdominal:     General:  There is no distension.     Palpations: Abdomen is soft.     Tenderness: There is no abdominal tenderness. There is no guarding.  Musculoskeletal:        General: No swelling or tenderness. Normal range of motion.     Right lower leg: No edema.     Left lower leg:  No edema.     Comments: No significant peripheral edema.  No appearance of cellulitis on the legs or the feet.  Skin:    General: Skin is warm and dry.  Neurological:     Comments: Patient is interactive.  She is answering questions.  She appears mildly confused intermittently but is giving history and seems situationally aware.  No focal motor deficits.  She will follow commands for movement of arms and legs.  Speech is without any significant slurring.  No cranial nerve deficits.     ED Results / Procedures / Treatments   Labs (all labs ordered are listed, but only abnormal results are displayed) Labs Reviewed  CBC WITH DIFFERENTIAL/PLATELET - Abnormal; Notable for the following components:      Result Value   WBC 12.8 (*)    Neutro Abs 10.2 (*)    Monocytes Absolute 1.1 (*)    Abs Immature Granulocytes 0.11 (*)    All other components within normal limits  PROTIME-INR  COMPREHENSIVE METABOLIC PANEL  LACTIC ACID, PLASMA  LACTIC ACID, PLASMA  BRAIN NATRIURETIC PEPTIDE  LIPASE, BLOOD  URINALYSIS, ROUTINE W REFLEX MICROSCOPIC  RAPID URINE DRUG SCREEN, HOSP PERFORMED  BLOOD GAS, VENOUS  AMMONIA  TROPONIN I (HIGH SENSITIVITY)  TROPONIN I (HIGH SENSITIVITY)    EKG None  Radiology DG Chest Port 1 View  Result Date: 07/16/2020 CLINICAL DATA:  Mental status changes. EXAM: PORTABLE CHEST 1 VIEW COMPARISON:  10/25/2019 FINDINGS: 2140 hours. Low lung volumes. Diffuse interstitial opacity is basilar predominant. Vascular congestion noted. Cardiopericardial silhouette is at upper limits of normal for size. Bones are diffusely demineralized. Degenerative changes noted both shoulders. IMPRESSION: Low volume film with basilar predominant interstitial opacity suggesting edema. Electronically Signed   By: Misty Stanley M.D.   On: 07/16/2020 21:46    Procedures Procedures   Medications Ordered in ED Medications - No data to display  ED Course  I have reviewed the triage vital signs  and the nursing notes.  Pertinent labs & imaging results that were available during my care of the patient were reviewed by me and considered in my medical decision making (see chart for details).    MDM Rules/Calculators/A&P                          Patient presents as outlined.  She has had some neurologic changes over the past couple of days.  She did have an MRI and a CT angiogram 2 days ago.  No acute findings identified.  At this time consideration given to infectious or metabolic source.  Patient's daughter did identify that she believes the patient went into opioid withdrawal after being evaluated 2 days ago.  This could be a contributing factor.  Also consideration for some degree of dementia as source of mental status change.  Will need to proceed with metabolic and infectious work-up.  Patient's vital signs are stable.  At this time no sign of sepsis based on hypotension or fever.  Dr. Leonette Monarch will review diagnostic results for potential etiology of presentation.  Disposition to be made based on patient's clinical  condition at time of completion of assessment and diagnostic results. Final Clinical Impression(s) / ED Diagnoses Final diagnoses:  Altered mental status, unspecified altered mental status type    Rx / DC Orders ED Discharge Orders    None       Charlesetta Shanks, MD 07/16/20 2321

## 2020-07-16 NOTE — ED Notes (Signed)
ED Provider at bedside. 

## 2020-07-16 NOTE — ED Triage Notes (Signed)
Pt EMS arrival from home, co of AMS A&Ox2 to self and place with family reporting LKN 3pm prior to nap, with family reporting pt unable to sleep x2 days. Pt seen Sunday when for slurred speech and weakness,per EMS family reports rapid decline. Per EMS pt able intermittently follows commands, with periods of clarity. Hx falls, takes Eliquis.   158/90 74R NS, bgl 125, 95% 97.58F Oral

## 2020-07-16 NOTE — ED Triage Notes (Signed)
Pt arrived from home via EMS. Medic called out by family about concerns of ongoing confusion. Concerns that confusion is ongoing from previous admission. Per medic patient A&O2 with intermittent cooperation. VSS with BP 158/90, CBG 124, 70bpm

## 2020-07-16 NOTE — Patient Instructions (Signed)
[name] , Thank you for taking time to come for your Medicare Wellness Visit. I appreciate your ongoing commitment to your health goals. Please review the following plan we discussed and let me know if I can assist you in the future.   Screening recommendations/referrals: Colonoscopy: no longer needed  Mammogram: no longer needed Bone Density: no longer needed Recommended yearly ophthalmology/optometry visit for glaucoma screening and checkup Recommended yearly dental visit for hygiene and checkup  Vaccinations: Influenza vaccine: current due fall 2022 Pneumococcal vaccine: completed series Tdap vaccine: current due  10/16/2024 Shingles vaccine: will obtain local pharmacy     Advanced directives: will provide copies   Conditions/risks identified: patient has recent ER visit diagnosis  TIA  Next appointment: None    Preventive Care 84 Years and Older, Female Preventive care refers to lifestyle choices and visits with your health care provider that can promote health and wellness. What does preventive care include?  A yearly physical exam. This is also called an annual well check.  Dental exams once or twice a year.  Routine eye exams. Ask your health care provider how often you should have your eyes checked.  Personal lifestyle choices, including:  Daily care of your teeth and gums.  Regular physical activity.  Eating a healthy diet.  Avoiding tobacco and drug use.  Limiting alcohol use.  Practicing safe sex.  Taking low-dose aspirin every day.  Taking vitamin and mineral supplements as recommended by your health care provider. What happens during an annual well check? The services and screenings done by your health care provider during your annual well check will depend on your age, overall health, lifestyle risk factors, and family history of disease. Counseling  Your health care provider may ask you questions about your:  Alcohol use.  Tobacco use.  Drug  use.  Emotional well-being.  Home and relationship well-being.  Sexual activity.  Eating habits.  History of falls.  Memory and ability to understand (cognition).  Work and work Statistician.  Reproductive health. Screening  You may have the following tests or measurements:  Height, weight, and BMI.  Blood pressure.  Lipid and cholesterol levels. These may be checked every 5 years, or more frequently if you are over 74 years old.  Skin check.  Lung cancer screening. You may have this screening every year starting at age 84 if you have a 30-pack-year history of smoking and currently smoke or have quit within the past 15 years.  Fecal occult blood test (FOBT) of the stool. You may have this test every year starting at age 84  Flexible sigmoidoscopy or colonoscopy. You may have a sigmoidoscopy every 5 years or a colonoscopy every 10 years starting at age 84  Hepatitis C blood test.  Hepatitis B blood test.  Sexually transmitted disease (STD) testing.  Diabetes screening. This is done by checking your blood sugar (glucose) after you have not eaten for a while (fasting). You may have this done every 1-3 years.  Bone density scan. This is done to screen for osteoporosis. You may have this done starting at age 84  Mammogram. This may be done every 1-2 years. Talk to your health care provider about how often you should have regular mammograms. Talk with your health care provider about your test results, treatment options, and if necessary, the need for more tests. Vaccines  Your health care provider may recommend certain vaccines, such as:  Influenza vaccine. This is recommended every year.  Tetanus, diphtheria, and acellular pertussis (  Tdap, Td) vaccine. You may need a Td booster every 10 years.  Zoster vaccine. You may need this after age 42.  Pneumococcal 13-valent conjugate (PCV13) vaccine. One dose is recommended after age 84  Pneumococcal polysaccharide  (PPSV23) vaccine. One dose is recommended after age 84 Talk to your health care provider about which screenings and vaccines you need and how often you need them. This information is not intended to replace advice given to you by your health care provider. Make sure you discuss any questions you have with your health care provider. Document Released: 04/12/2015 Document Revised: 12/04/2015 Document Reviewed: 01/15/2015 Elsevier Interactive Patient Education  2017 [place] Prevention in the Home Falls can cause injuries. They can happen to people of all ages. There are many things you can do to make your home safe and to help prevent falls. What can I do on the outside of my home?  Regularly fix the edges of walkways and driveways and fix any cracks.  Remove anything that might make you trip as you walk through a door, such as a raised step or threshold.  Trim any bushes or trees on the path to your home.  Use bright outdoor lighting.  Clear any walking paths of anything that might make someone trip, such as rocks or tools.  Regularly check to see if handrails are loose or broken. Make sure that both sides of any steps have handrails.  Any raised decks and porches should have guardrails on the edges.  Have any leaves, snow, or ice cleared regularly.  Use sand or salt on walking paths during winter.  Clean up any spills in your garage right away. This includes oil or grease spills. What can I do in the bathroom?  Use night lights.  Install grab bars by the toilet and in the tub and shower. Do not use towel bars as grab bars.  Use non-skid mats or decals in the tub or shower.  If you need to sit down in the shower, use a plastic, non-slip stool.  Keep the floor dry. Clean up any water that spills on the floor as soon as it happens.  Remove soap buildup in the tub or shower regularly.  Attach bath mats securely with double-sided non-slip rug tape.  Do not have  throw rugs and other things on the floor that can make you trip. What can I do in the bedroom?  Use night lights.  Make sure that you have a light by your bed that is easy to reach.  Do not use any sheets or blankets that are too big for your bed. They should not hang down onto the floor.  Have a firm chair that has side arms. You can use this for support while you get dressed.  Do not have throw rugs and other things on the floor that can make you trip. What can I do in the kitchen?  Clean up any spills right away.  Avoid walking on wet floors.  Keep items that you use a lot in easy-to-reach places.  If you need to reach something above you, use a strong step stool that has a grab bar.  Keep electrical cords out of the way.  Do not use floor polish or wax that makes floors slippery. If you must use wax, use non-skid floor wax.  Do not have throw rugs and other things on the floor that can make you trip. What can I do with my stairs?  Do  not leave any items on the stairs.  Make sure that there are handrails on both sides of the stairs and use them. Fix handrails that are broken or loose. Make sure that handrails are as long as the stairways.  Check any carpeting to make sure that it is firmly attached to the stairs. Fix any carpet that is loose or worn.  Avoid having throw rugs at the top or bottom of the stairs. If you do have throw rugs, attach them to the floor with carpet tape.  Make sure that you have a light switch at the top of the stairs and the bottom of the stairs. If you do not have them, ask someone to add them for you. What else can I do to help prevent falls?  Wear shoes that:  Do not have high heels.  Have rubber bottoms.  Are comfortable and fit you well.  Are closed at the toe. Do not wear sandals.  If you use a stepladder:  Make sure that it is fully opened. Do not climb a closed stepladder.  Make sure that both sides of the stepladder are  locked into place.  Ask someone to hold it for you, if possible.  Clearly mark and make sure that you can see:  Any grab bars or handrails.  First and last steps.  Where the edge of each step is.  Use tools that help you move around (mobility aids) if they are needed. These include:  Canes.  Walkers.  Scooters.  Crutches.  Turn on the lights when you go into a dark area. Replace any light bulbs as soon as they burn out.  Set up your furniture so you have a clear path. Avoid moving your furniture around.  If any of your floors are uneven, fix them.  If there are any pets around you, be aware of where they are.  Review your medicines with your doctor. Some medicines can make you feel dizzy. This can increase your chance of falling. Ask your doctor what other things that you can do to help prevent falls. This information is not intended to replace advice given to you by your health care provider. Make sure you discuss any questions you have with your health care provider. Document Released: 01/10/2009 Document Revised: 08/22/2015 Document Reviewed: 04/20/2014 Elsevier Interactive Patient Education  2017 [employer].

## 2020-07-17 ENCOUNTER — Other Ambulatory Visit: Payer: Self-pay

## 2020-07-17 ENCOUNTER — Emergency Department (HOSPITAL_COMMUNITY): Payer: Medicare Other

## 2020-07-17 ENCOUNTER — Ambulatory Visit (HOSPITAL_BASED_OUTPATIENT_CLINIC_OR_DEPARTMENT_OTHER): Payer: Medicare Other | Admitting: Physical Therapy

## 2020-07-17 DIAGNOSIS — R1110 Vomiting, unspecified: Secondary | ICD-10-CM | POA: Diagnosis not present

## 2020-07-17 LAB — I-STAT VENOUS BLOOD GAS, ED
Acid-Base Excess: 10 mmol/L — ABNORMAL HIGH (ref 0.0–2.0)
Bicarbonate: 33.5 mmol/L — ABNORMAL HIGH (ref 20.0–28.0)
Calcium, Ion: 1.04 mmol/L — ABNORMAL LOW (ref 1.15–1.40)
HCT: 42 % (ref 36.0–46.0)
Hemoglobin: 14.3 g/dL (ref 12.0–15.0)
O2 Saturation: 99 %
Potassium: 4.7 mmol/L (ref 3.5–5.1)
Sodium: 136 mmol/L (ref 135–145)
TCO2: 35 mmol/L — ABNORMAL HIGH (ref 22–32)
pCO2, Ven: 38.9 mmHg — ABNORMAL LOW (ref 44.0–60.0)
pH, Ven: 7.543 — ABNORMAL HIGH (ref 7.250–7.430)
pO2, Ven: 145 mmHg — ABNORMAL HIGH (ref 32.0–45.0)

## 2020-07-17 LAB — URINALYSIS, ROUTINE W REFLEX MICROSCOPIC
Bilirubin Urine: NEGATIVE
Glucose, UA: NEGATIVE mg/dL
Hgb urine dipstick: NEGATIVE
Ketones, ur: 5 mg/dL — AB
Leukocytes,Ua: NEGATIVE
Nitrite: NEGATIVE
Protein, ur: NEGATIVE mg/dL
Specific Gravity, Urine: 1.044 — ABNORMAL HIGH (ref 1.005–1.030)
pH: 6 (ref 5.0–8.0)

## 2020-07-17 LAB — RAPID URINE DRUG SCREEN, HOSP PERFORMED
Amphetamines: NOT DETECTED
Barbiturates: NOT DETECTED
Benzodiazepines: POSITIVE — AB
Cocaine: NOT DETECTED
Opiates: POSITIVE — AB
Tetrahydrocannabinol: NOT DETECTED

## 2020-07-17 LAB — TROPONIN I (HIGH SENSITIVITY): Troponin I (High Sensitivity): 15 ng/L (ref <18)

## 2020-07-17 MED ORDER — IOHEXOL 300 MG/ML  SOLN
100.0000 mL | Freq: Once | INTRAMUSCULAR | Status: AC | PRN
  Administered 2020-07-17: 100 mL via INTRAVENOUS

## 2020-07-17 MED ORDER — HYDROCODONE-ACETAMINOPHEN 5-325 MG PO TABS
2.0000 | ORAL_TABLET | Freq: Once | ORAL | Status: AC
  Administered 2020-07-17: 2 via ORAL
  Filled 2020-07-17: qty 2

## 2020-07-17 NOTE — ED Provider Notes (Signed)
I assumed care of this patient.  Please see previous provider note for further details of Hx, PE.  Briefly patient is a 86 y.o. female who presented here for AMS. Recently admitted for the same and ruled for stroke. Appears to be related to recent medication dosing unintended alterations. Patient's daughter would like to rule out infectious process.  CBC with improving leukocytosis.  No anemia.  No significant electrolyte derangements or renal sufficiency. Lactic acid negative.  Ammonia level negative. UA without evidence of infection. Chest x-ray with bibasilar haziness. CT of the abdomen showed that the bases were within normal limits and there was no pneumonia.  No evidence of intra-abdominal inflammatory/infectious process or obstruction.  Patient was able to tolerate oral intake. Alert and oriented x4.  The patient appears reasonably screened and/or stabilized for discharge and I doubt any other medical condition or other [facility] requiring further screening, evaluation, or treatment in the ED at this time prior to discharge. Safe for discharge with strict return precautions.  Disposition: Discharge  Condition: Good  I have discussed the results, Dx and Tx plan with the patient/family who expressed understanding and agree(s) with the plan. Discharge instructions discussed at length. The patient/family was given strict return precautions who verbalized understanding of the instructions. No further questions at time of discharge.    ED Discharge Orders    None        Follow Up: Eulas Post, MD [place] [id number] [phone]  Call  if you have not been called about your appointment         Roberta Angell, Grayce Sessions, MD 07/17/20 [phone]

## 2020-07-19 ENCOUNTER — Ambulatory Visit (HOSPITAL_BASED_OUTPATIENT_CLINIC_OR_DEPARTMENT_OTHER): Payer: Medicare Other | Admitting: Physical Therapy

## 2020-07-19 ENCOUNTER — Encounter: Payer: Self-pay | Admitting: Family Medicine

## 2020-07-19 ENCOUNTER — Ambulatory Visit (INDEPENDENT_AMBULATORY_CARE_PROVIDER_SITE_OTHER): Payer: Medicare Other | Admitting: Family Medicine

## 2020-07-19 ENCOUNTER — Other Ambulatory Visit: Payer: Self-pay

## 2020-07-19 VITALS — BP 130/80 | HR 96 | Temp 97.8°F

## 2020-07-19 DIAGNOSIS — I4891 Unspecified atrial fibrillation: Secondary | ICD-10-CM

## 2020-07-19 DIAGNOSIS — R410 Disorientation, unspecified: Secondary | ICD-10-CM | POA: Diagnosis not present

## 2020-07-19 DIAGNOSIS — F5104 Psychophysiologic insomnia: Secondary | ICD-10-CM | POA: Diagnosis not present

## 2020-07-19 MED ORDER — ARIPIPRAZOLE 2 MG PO TABS: 2.0000 mg | ORAL_TABLET | Freq: Every evening | ORAL | 1 refills | Status: DC | PRN

## 2020-07-19 NOTE — Progress Notes (Signed)
Established Patient Office Visit  Subjective:  Patient ID: Betty Jackson, female    DOB: 1934/10/09  Age: 86 y.o. MRN: [id number]  CC:  Chief Complaint  Patient presents with  . Altered Mental Status    Please see mychart messages    HPI Betty Jackson presents for recent acute mental status changes.  She has complicated past history with history of CAD, atrial fibrillation, congestive heart failure, temporal arteritis, hypertension, history of CVA, osteoporosis, rheumatoid arthritis, chronic kidney disease, chronic severe back pain.  She is accompanied by her daughter [name] today who is her primary caregiver.  They have some other helpers but they are out of town this week.  Most of her care is falling on 2 daughters.  First ER visit was on 4-17/2022.  She presented with acute expressive type aphasia just prior to arrival.  She was already on Eliquis for A. fib.  There was some question of right upper extremity weakness.  Speech difficulties apparently had resolved the time she was in the ER.  Code stroke was called and she was seen by neurology.  She had extensive work-up with CT head, CT angiogram of the head and neck, MRI brain and these did not show any acute findings.  She had multiple labs done which were unrevealing of any obvious acute abnormality.  Apparently in the process of getting her MRI her Duragesic was taken off and this was not discovered by daughter until the next day on Monday.  She was discharged home and Monday she developed some increased shakes and tremors and possible withdrawal symptoms.  At that point when daughter realized that she no longer had a patch on she applied a new patch.  Patient was then taken back to the ER on the 19th to reassess because of some intermittent confusion.  Daughter relates that patient has not slept much at all for the past 3 nights and has been having occasional visual hallucinations and intermittently agitated (no hx of Lewy Body Dementia).   She has baseline of chronic insomnia but especially worse past few nights.  No alcohol use.  Daughter has been giving some Benadryl and had given her 1 Xanax but not consistently taking alprazolam.  When she went back on the 19th she had further labs done including things like ammonia level and urine drug screen and urinalysis.  She did have benzodiazepines on drug screen but that was related to the Xanax that had been given.  She is stable at this time.  Daughter's biggest concern is safety issues with leaving her alone at night because she is trying to get up at night and has had the confusion.  Her insomnia has been a chronic problem.    Past Medical History:  Diagnosis Date  . Acute respiratory failure with hypoxia ([place]) 05/20/2017  . Anemia, unspecified 10/28/2012  . Anxiety state, unspecified 10/28/2012  . CAD (coronary artery disease)    a. Stent RCA in [facility];  b. Cath approx 2009 - nonobs per pt report.  . Cataract    immature on the left eye  . Chronic insomnia 02/07/2013  . Chronic lower back pain    scoliosis  . CKD (chronic kidney disease) stage 3, GFR 30-59 ml/min (HCC)   . CVA (cerebral infarction) 10/29/2012  . DDD (degenerative disc disease)   . Diverticulosis   . GERD (gastroesophageal reflux disease) 09/01/2010  . Hemorrhoids   . Herniated nucleus pulposus, L5-S1, right 11/04/2015  . Hyperlipidemia  takes Lipitor daily  . Hypertension    takes Amlodipine,Losartan,Metoprolol,and HCTZ daily  . Incontinence of urine   . Insomnia    takes Restoril nightly  . Lumbar stenosis 04/25/2013  . Major depressive disorder, recurrent episode, moderate (HCC) 07/16/2013  . Osteoporosis   . PAF (paroxysmal atrial fibrillation) (HCC) 2011   a. lone epidode in 2011 according to notes.  . Rheumatoid arthritis (HCC)   . Scoliosis   . Sepsis (HCC) 05/2019  . Temporal arteritis (HCC) 2011   a. followed @ [facility]; potential flareup 10/28/2012/notes 10/28/2012  . Vocal cord dysfunction     "they don't operate properly" (10/28/2012)    Past Surgical History:  Procedure Laterality Date  . ABDOMINAL HYSTERECTOMY  ~ 1984   vaginally  . BACK SURGERY  [id number] ago   X Stop  . BLADDER SUSPENSION  2001  . BREAST BIOPSY Right   . CATARACT EXTRACTION W/ INTRAOCULAR LENS IMPLANT Right ~ 08/2012  . CHEST TUBE INSERTION Left 03/08/2019   Procedure: Chest Tube Insertion;  Surgeon: Kerin PernaVan Trigt, Peter, MD;  [facility];  Service: Thoracic;  Laterality: Left;  . COLONOSCOPY  01/26/2012   Procedure: COLONOSCOPY;  Surgeon: Meryl DareMalcolm T Stark, MD,FACG;  [facility];  Service: Endoscopy;  Laterality: N/A;  note the EGD is possible  . CORONARY ANGIOPLASTY WITH STENT PLACEMENT  2006   X 1 stent  . EPIDURAL BLOCK INJECTION    . ESOPHAGOGASTRODUODENOSCOPY  01/26/2012   Procedure: ESOPHAGOGASTRODUODENOSCOPY (EGD);  Surgeon: Meryl DareMalcolm T Stark, MD,FACG;  [facility];  Service: Endoscopy;  Laterality: N/A;  . FINE NEEDLE ASPIRATION Right 09/26/2019   Procedure: FINE NEEDLE ASPIRATION;  Surgeon: Myrene GalasHandy, Michael, MD;  [facility];  Service: Orthopedics;  Laterality: Right;  . HEMIARTHROPLASTY HIP Right 2012  . IR EPIDUROGRAPHY  04/20/2019  . LAPAROSCOPIC CHOLECYSTECTOMY  2001  . LUMBAR FUSION  03/2013  . LUMBAR LAMINECTOMY/DECOMPRESSION MICRODISCECTOMY Right 11/04/2015   Procedure: Right Lumbar Five-Sacral One Microdiskectomy;  Surgeon: Barnett AbuHenry Elsner, MD;  Location: [facility] NEURO ORS;  Service: Neurosurgery;  Laterality: Right;  Right L5-S1 Microdiskectomy  . moles removed that required stiches     one on leg and one on face  . ORIF FEMUR FRACTURE Right 09/26/2019   Procedure: OPEN REDUCTION INTERNAL FIXATION (ORIF) DISTAL FEMUR FRACTURE;  Surgeon: Myrene GalasHandy, Michael, MD;  Location: [facility] OR;  Service: Orthopedics;  Laterality: Right;  . ORIF FEMUR FRACTURE Right 10/17/2019   Procedure: OPEN REDUCTION INTERNAL FIXATION (ORIF) DISTAL FEMUR FRACTURE;  Surgeon: Myrene GalasHandy, Michael, MD;  Location: [facility] OR;   Service: Orthopedics;  Laterality: Right;  . SUBXYPHOID PERICARDIAL WINDOW N/A 03/08/2019   Procedure: SUBXYPHOID PERICARDIAL WINDOW;  Surgeon: Kerin PernaVan Trigt, Peter, MD;  [facility];  Service: Thoracic;  Laterality: N/A;  . TEE WITHOUT CARDIOVERSION N/A 03/08/2019   Procedure: TRANSESOPHAGEAL ECHOCARDIOGRAM (TEE);  Surgeon: Donata ClayVan Trigt, Theron AristaPeter, MD;  [facility];  Service: Thoracic;  Laterality: N/A;  . TEMPORAL ARTERY BIOPSY / LIGATION Bilateral 2011  . TONSILLECTOMY AND ADENOIDECTOMY     at age 86  . TUBAL LIGATION  ~ 1982  . X-STOP IMPLANTATION  ~ 2010   "lower back" (10/28/2012)    Family History  Problem Relation Age of Onset  . Brain cancer Mother [id number]  . Heart attack Father   . Breast cancer Daughter [id number]  . Heart disease Other   . Hypertension Other   . Arthritis Neg Hx   . Colon cancer Neg Hx   . Osteoporosis Neg Hx  Social History   Socioeconomic History  . Marital status: Married    Spouse name: Not on file  . Number of children: 3  . Years of education: Not on file  . Highest education level: Not on file  Occupational History  . Occupation: Retired Cabin crew  Tobacco Use  . Smoking status: Never Smoker  . Smokeless tobacco: Never Used  Vaping Use  . Vaping Use: Never used  Substance and Sexual Activity  . Alcohol use: Yes    Comment: socially  . Drug use: No  . Sexual activity: Not on file  Other Topics Concern  . Not on file  Social History Narrative   Lives in [place] with her husband.  Retired Cabin crew.   Social Determinants of Health   Financial Resource Strain: Low Risk   . Difficulty of Paying Living Expenses: Not hard at all  Food Insecurity: No Food Insecurity  . Worried About Charity fundraiser in the Last Year: Never true  . Ran Out of Food in the Last Year: Never true  Transportation Needs: No Transportation Needs  . Lack of Transportation (Medical): No  . Lack of Transportation (Non-Medical): No  Physical Activity: Inactive  . Days of  Exercise per Week: 0 days  . Minutes of Exercise per Session: 0 min  Stress: Not on file  Social Connections: Unknown  . Frequency of Communication with Friends and Family: More than three times a week  . Frequency of Social Gatherings with Friends and Family: More than three times a week  . Attends Religious Services: Never  . Active Member of Clubs or Organizations: No  . Attends Archivist Meetings: Never  . Marital Status: Patient refused  Intimate Partner Violence: Not At Risk  . Fear of Current or Ex-Partner: No  . Emotionally Abused: No  . Physically Abused: No  . Sexually Abused: No    Outpatient Medications Prior to Visit  Medication Sig Dispense Refill  . acetaminophen (TYLENOL) 650 MG CR tablet Take 1,300 mg by mouth daily as needed for pain.    Marland Kitchen alendronate (FOSAMAX) 70 MG tablet Take 1 tablet (70 mg total) by mouth every 7 (seven) days. Take with a full glass of water on an empty stomach. (Patient taking differently: Take 70 mg by mouth every Thursday. Take with a full glass of water on an empty stomach.) 4 tablet 11  . Ascorbic Acid (VITAMIN C PO) Take 1 tablet by mouth daily with supper.    . Calcium Carbonate Antacid (TUMS PO) Take 1 tablet by mouth daily with supper.    . cholecalciferol (VITAMIN D) 25 MCG tablet Take 2 tablets (2,000 Units total) by mouth 2 (two) times daily. (Patient taking differently: Take 2,000 Units by mouth daily with supper.)    . diltiazem (CARDIZEM CD) 240 MG 24 hr capsule Take 1 capsule (240 mg total) by mouth daily. (Patient taking differently: Take 240 mg by mouth daily with supper.) 90 capsule 3  . docusate sodium (COLACE) 100 MG capsule Take 100 mg by mouth daily as needed for mild constipation.    Marland Kitchen ELIQUIS 5 MG TABS tablet TAKE 1 TABLET BY MOUTH TWICE A DAY (Patient taking differently: Take 5 mg by mouth 2 (two) times daily with a meal.) 60 tablet 5  . escitalopram (LEXAPRO) 10 MG tablet Take 1 tablet (10 mg total) by mouth  daily. 90 tablet 1  . fentaNYL (DURAGESIC) 25 MCG/HR Place 1 patch onto the skin every 3 (three) days.  10 patch 0  . ferrous sulfate 325 (65 FE) MG tablet Take 1 tablet (325 mg total) by mouth 2 (two) times daily with a meal. (Patient taking differently: Take 325 mg by mouth daily with supper.) 60 tablet 3  . furosemide (LASIX) 40 MG tablet TAKE 1 TABLET BY MOUTH EVERY DAY (Patient taking differently: Take 40 mg by mouth daily.) 30 tablet 0  . HYDROcodone-acetaminophen (NORCO) 10-325 MG tablet Take one tablet by mouth every 8 hours as needed for breakthrough pain. (Patient taking differently: Take 1 tablet by mouth every 8 (eight) hours as needed (breakthrough pain).) 30 tablet 0  . LACTOBACILLUS PO Take 1 capsule by mouth daily with supper.    . losartan (COZAAR) 50 MG tablet Take 1 tablet (50 mg total) by mouth daily. (Patient taking differently: Take 50 mg by mouth daily with supper.) 90 tablet 3  . MAGNESIUM PO Take 1 tablet by mouth 2 (two) times a week.    . metoprolol tartrate (LOPRESSOR) 100 MG tablet Take 50 mg by mouth 2 (two) times daily with a meal.    . Multiple Vitamin (MULTIVITAMIN WITH MINERALS) TABS tablet Take 1 tablet by mouth daily. 60 tablet 0  . potassium chloride (KLOR-CON) 10 MEQ tablet Take 1 tablet (10 mEq total) by mouth daily. (Patient taking differently: Take 10 mEq by mouth daily with supper.) 90 tablet 2  . predniSONE (DELTASONE) 10 MG tablet Take 1 tablet (10 mg total) by mouth daily with breakfast. 30 tablet 0  . senna-docusate (SENOKOT-S) 8.6-50 MG tablet Take 1 tablet by mouth every other day.    Marland Kitchen VITAMIN E PO Take 1 capsule by mouth daily with supper.    . ALPRAZolam (XANAX) 0.5 MG tablet Take 1 tablet (0.5 mg total) by mouth at bedtime as needed for sleep. 7 tablet 0  . diphenhydrAMINE (BENADRYL) 25 MG tablet Take 25 mg by mouth See admin instructions. Take one tablet (25 mg) by mouth twice during the night as needed for coughing.     No facility-administered  medications prior to visit.    Allergies  Allergen Reactions  . Dextromethorphan Rash    ROS Review of Systems  Constitutional: Negative for chills and fever.  Respiratory: Negative for cough and shortness of breath.   Cardiovascular: Negative for chest pain.  Gastrointestinal: Negative for abdominal pain.  Musculoskeletal: Positive for back pain.      Objective:    Physical Exam Vitals reviewed.  Constitutional:      Appearance: Normal appearance.  Cardiovascular:     Rate and Rhythm: Normal rate and regular rhythm.  Pulmonary:     Effort: Pulmonary effort is normal.     Breath sounds: Normal breath sounds.  Musculoskeletal:     Right lower leg: No edema.     Left lower leg: No edema.  Neurological:     Mental Status: She is alert.     BP 130/80 (BP Location: Left Arm, Patient Position: Sitting, Cuff Size: Normal)   Pulse 96   Temp 97.8 F (36.6 C) (Oral)   SpO2 99%  Wt Readings from Last 3 Encounters:  07/14/20 160 lb 0.9 oz (72.6 kg)  05/24/20 158 lb (71.7 kg)  03/20/20 157 lb 6.4 oz (71.4 kg)     Health Maintenance Due  Topic Date Due  . COVID-19 Vaccine (1) Never done  . FOOT EXAM  Never done  . OPHTHALMOLOGY EXAM  Never done  . DEXA SCAN  Never done  . PNA vac  Low Risk Adult (2 of 2 - PPSV23) 04/02/2009  . HEMOGLOBIN A1C  09/05/2019    There are no preventive care reminders to display for this patient.  Lab Results  Component Value Date   TSH 3.085 03/06/2019   Lab Results  Component Value Date   WBC 12.8 (H) 07/16/2020   HGB 14.3 07/16/2020   HCT 42.0 07/16/2020   MCV 94.6 07/16/2020   PLT 208 07/16/2020   Lab Results  Component Value Date   NA 136 07/16/2020   K 4.7 07/16/2020   CO2 26 07/16/2020   GLUCOSE 100 (H) 07/16/2020   BUN 23 07/16/2020   CREATININE 0.95 07/16/2020   BILITOT 0.9 07/16/2020   ALKPHOS 57 07/16/2020   AST 23 07/16/2020   ALT 25 07/16/2020   PROT 6.4 (L) 07/16/2020   ALBUMIN 3.6 07/16/2020   CALCIUM  8.7 (L) 07/16/2020   ANIONGAP 10 07/16/2020   GFR 58.93 (L) 06/21/2020   Lab Results  Component Value Date   CHOL 171 11/14/2018   Lab Results  Component Value Date   HDL 63.40 11/14/2018   Lab Results  Component Value Date   LDLCALC 74 11/14/2018   Lab Results  Component Value Date   TRIG 168.0 (H) 11/14/2018   Lab Results  Component Value Date   CHOLHDL 3 11/14/2018   Lab Results  Component Value Date   HGBA1C 5.9 (H) 03/07/2019      Assessment & Plan:   #1 acute mental status changes.  She has presented with some delirium with etiology not entirely clear.  She had imaging which did not show any obvious acute CVA.  No obvious infectious cause.  No obvious metabolic derangement. -We did discuss medications.  She apparently has been taking Benadryl and we discussed concerns with anticholinergic side effects and risk with Benadryl and have strongly advised discontinuing that -Also recommend no further alprazolam with her chronic opioid use with fentanyl -Also try to limit hydrocodone unless she is clearly in severe pain and needing breakthrough medication -She had extensive work-up recently in terms of imaging and labs and we reviewed these today - no recent evidence for UTI or pneumonia.    #2 insomnia with nighttime agitation.  Patient already on Lexapro.  We discussed concerns with benzodiazepines as above and also potential risks including increased mortality with atypical antipsychotics.  We did discuss possible trial of low-dose Abilify 2 mg nightly Avoid further use of anti-cholinergics (ie Benadryl) which could worsen her confusion  #3 intermittent atrial fibrillation.  Currently stable.  Continue Eliquis and diltiazem  Meds ordered this encounter  Medications  . ARIPiprazole (ABILIFY) 2 MG tablet    Sig: Take 1 tablet (2 mg total) by mouth at bedtime as needed.    Dispense:  30 tablet    Refill:  1    Follow-up: No follow-ups on file.    Carolann Littler,  MD

## 2020-07-19 NOTE — Patient Instructions (Signed)
Delirium °Delirium is a state of mental confusion. It comes on quickly and causes significant changes in a person's thinking and behavior. °People with delirium usually have trouble paying attention to what is going on or knowing where they are. They may become very withdrawn or very emotional and unable to sit still. They may even see or feel things that are not there (hallucinations). Delirium is a sign of a serious underlying medical condition. °What are the causes? °Delirium occurs when something suddenly affects the signals that the brain sends out. Brain signals can be affected by anything that puts severe stress on the body and brain and causes brain chemicals to be out of balance. The most common causes of delirium include: °· Infections. These may be bacterial, viral, fungal, or protozoal. °· Medicines. These include many over-the-counter and prescription medicines. °· Recreational drugs. °· Substance withdrawal. This occurs with sudden discontinuation of alcohol, certain medicines, or recreational drugs. °· Surgery and anesthesia. °· Sudden vascular events, such as stroke and brain hemorrhage. °· Other brain disorders, such as migraines, tumors, seizures, and physical head trauma. °· Metabolic disorders, such as kidney or liver failure. °· Low blood oxygen (anoxia). This may occur with lung disease, cardiac arrest, or carbon monoxide poisoning. °· Hormone imbalances (endocrinopathies), such as an overactive thyroid (hyperthyroidism) or underactive thyroid (hypothyroidism). °· Vitamin deficiencies. °What increases the risk? °The following factors may make someone more likely to develop this condition: °· Being a child. °· Being an older person. °· Living alone. °· Having vision loss or hearing loss. °· Having an existing brain disease, such as dementia. °· Having long-lasting (chronic) medical conditions, such as heart disease. °· Being hospitalized for long periods of time. °What are the signs or  symptoms? °Delirium starts with a sudden change in a person's thinking or behavior. Symptoms include: °· Not being able to stay awake (drowsiness) or pay attention. °· Being confused about places, time, and people. °· Forgetfulness. °· Having extreme energy levels. These may be low or high. °· Changes in sleep patterns. °· Extreme mood swings, such as sudden anger or anxiety. °· Focusing on things or ideas that are not important. °· Rambling and senseless talking. °· Difficulty speaking, understanding speech, or both. °· Hallucinations. °· Tremor or unsteady gait. °Symptoms come and go throughout the day and are often worse at the end of the day. °How is this diagnosed? °People with delirium may not realize that they have the condition. Often, a family member or health care provider is the first person to notice the changes. This condition may be diagnosed based on a physical exam, health history, and tests. °· The health care provider will obtain a detailed history. This may include questions about: °? Current symptoms. °? Medical conditions that you have. °? Medicines. °? Drug use. °· The health care provider will perform a mental status test by: °? Asking questions to check for confusion. °? Watching for abnormal behavior. °· The health care provider may also order lab tests or additional studies to determine the cause of the delirium. °How is this treated? °Treatment of delirium depends on the cause and severity. Delirium usually goes away within days or weeks of treating the underlying cause. In the meantime, do not leave the person alone because he or she may accidentally cause self-harm. This condition may be treated with supportive care, such as: °· Increased light during the day and decreased light at night. °· Low noise level. °· Uninterrupted sleep. °· A regular daily   schedule. °· Clocks and calendars to help with orientation. °· Familiar objects, including the person's pictures and clothing. °· Frequent  visits from familiar family and friends. °· A healthy diet. °· Gentle exercise. °In more severe cases of delirium, medicine may be prescribed to help the person keep calm and think more clearly.   °Follow these instructions at home: °· Continue supportive care as told by a health care provider. °· Take over-the-counter and prescription medicines only as told by your health care provider. °· Ask a health care provider before using herbs or supplements. °· Do not use alcohol or illegal drugs. °· Keep all follow-up visits. This is important.   °Contact a health care provider if: °· Symptoms do not get better or they become worse. °· New symptoms of delirium develop. °· Caring for the person at home does not seem safe. °· Eating, drinking, or communicating stops. °· There are side effects of medicines, such as changes in sleep patterns, dizziness, weight gain, restlessness, movement changes, or tremors. °Get help right away if: °· The person has thoughts of harming self or harming others. °· There are serious side effects of medicine, such as: °? Swelling of the face, lips, tongue, or throat. °? Fever, confusion, muscle spasms, or seizures. °If you ever feel like a loved one may hurt himself or herself or others, or shares thoughts about taking his or her own life, get help right away. You can go to your nearest emergency department or: °· Call your local emergency services (911 in the [place] °· Call a suicide crisis helpline, such as the National Suicide Prevention Lifeline at 1-800-273-8255. This is open 24 hours a day in the [place] °· Text the Crisis Text Line at 741741 (in the [place] °Summary °· Delirium is a state of mental confusion. It comes on quickly and causes significant changes in a person's thinking and behavior. °· Delirium is a sign of a serious underlying medical condition. °· Certain medical conditions or a long hospital stay may increase the risk of developing delirium. °· Treatment of delirium involves  treating the underlying cause and providing supportive treatments, such as a calm and familiar environment. °This information is not intended to replace advice given to you by your health care provider. Make sure you discuss any questions you have with your health care provider. °Document Revised: 06/23/2019 Document Reviewed: 06/23/2019 °Elsevier Patient Education © 2021 [employer] ° °

## 2020-07-23 ENCOUNTER — Telehealth: Payer: Self-pay | Admitting: Family Medicine

## 2020-07-23 NOTE — Telephone Encounter (Signed)
Spoke with Manus Gunning, she stated the patients daughter reached out to them to enquire about hospice care for [place]. I informed Manus Gunning that the patient would need and order from Dr. Elease Hashimoto for this. Manus Gunning stated she would reach out to the patient and her daughter and let them know to see if they would like to proceed. Nothing further needed at this time.

## 2020-07-23 NOTE — Telephone Encounter (Signed)
Manus Gunning from [facility] called wanting to talk to Dr. Erick Blinks medical assistant.  Please advise  [place] [phone]

## 2020-07-24 ENCOUNTER — Ambulatory Visit (HOSPITAL_BASED_OUTPATIENT_CLINIC_OR_DEPARTMENT_OTHER): Payer: Medicare Other | Admitting: Physical Therapy

## 2020-07-24 ENCOUNTER — Other Ambulatory Visit: Payer: Self-pay

## 2020-07-24 ENCOUNTER — Encounter (HOSPITAL_BASED_OUTPATIENT_CLINIC_OR_DEPARTMENT_OTHER): Payer: Self-pay | Admitting: Physical Therapy

## 2020-07-24 ENCOUNTER — Telehealth: Payer: Self-pay | Admitting: Family Medicine

## 2020-07-24 DIAGNOSIS — R262 Difficulty in walking, not elsewhere classified: Secondary | ICD-10-CM | POA: Diagnosis not present

## 2020-07-24 DIAGNOSIS — R2689 Other abnormalities of gait and mobility: Secondary | ICD-10-CM | POA: Diagnosis not present

## 2020-07-24 DIAGNOSIS — R293 Abnormal posture: Secondary | ICD-10-CM | POA: Diagnosis not present

## 2020-07-24 DIAGNOSIS — M6281 Muscle weakness (generalized): Secondary | ICD-10-CM | POA: Diagnosis not present

## 2020-07-24 DIAGNOSIS — M25551 Pain in right hip: Secondary | ICD-10-CM

## 2020-07-24 NOTE — Telephone Encounter (Signed)
Pt daughter call and want you to give her a call back about her mother.

## 2020-07-25 ENCOUNTER — Encounter (HOSPITAL_BASED_OUTPATIENT_CLINIC_OR_DEPARTMENT_OTHER): Payer: Self-pay | Admitting: Physical Therapy

## 2020-07-25 NOTE — Therapy (Signed)
[place], [place], [address] Phone: [phone]   Fax:  [phone]  Physical Therapy Treatment  Patient Details  Name: Betty Jackson MRN: [id number] Date of Birth: 09-15-1934 Referring Provider (PT): Altamese Ten Sleep, MD   Encounter Date: 07/24/2020   PT End of Session - 07/25/20 1239    Visit Number 21    Number of Visits 39    Date for PT Re-Evaluation 09/05/20    Authorization Type UHC Medicare    PT Start Time [id number]    PT Stop Time 1228    PT Time Calculation (min) 43 min    Activity Tolerance Patient tolerated treatment well    Behavior During Therapy [facility] for tasks assessed/performed           Past Medical History:  Diagnosis Date  . Acute respiratory failure with hypoxia ([place]) 05/20/2017  . Anemia, unspecified 10/28/2012  . Anxiety state, unspecified 10/28/2012  . CAD (coronary artery disease)    a. Stent RCA in [facility];  b. Cath approx 2009 - nonobs per pt report.  . Cataract    immature on the left eye  . Chronic insomnia 02/07/2013  . Chronic lower back pain    scoliosis  . CKD (chronic kidney disease) stage 3, GFR 30-59 ml/min (HCC)   . CVA (cerebral infarction) 10/29/2012  . DDD (degenerative disc disease)   . Diverticulosis   . GERD (gastroesophageal reflux disease) 09/01/2010  . Hemorrhoids   . Herniated nucleus pulposus, L5-S1, right 11/04/2015  . Hyperlipidemia    takes Lipitor daily  . Hypertension    takes Amlodipine,Losartan,Metoprolol,and HCTZ daily  . Incontinence of urine   . Insomnia    takes Restoril nightly  . Lumbar stenosis 04/25/2013  . Major depressive disorder, recurrent episode, moderate ([place]) 07/16/2013  . Osteoporosis   . PAF (paroxysmal atrial fibrillation) ([place]) 2011   a. lone epidode in 2011 according to notes.  . Rheumatoid arthritis ([place])   . Scoliosis   . Sepsis ([place]) 05/2019  . Temporal arteritis ([place]) 2011   a. followed @ [facility]; potential  flareup 10/28/2012/notes 10/28/2012  . Vocal cord dysfunction    "they don't operate properly" (10/28/2012)    Past Surgical History:  Procedure Laterality Date  . ABDOMINAL HYSTERECTOMY  ~ 1984   vaginally  . BACK SURGERY  7-31yrs ago   X Stop  . BLADDER SUSPENSION  2001  . BREAST BIOPSY Right   . CATARACT EXTRACTION W/ INTRAOCULAR LENS IMPLANT Right ~ 08/2012  . CHEST TUBE INSERTION Left 03/08/2019   Procedure: Chest Tube Insertion;  Surgeon: Ivin Poot, MD;  Location: [place];  Service: Thoracic;  Laterality: Left;  . COLONOSCOPY  01/26/2012   Procedure: COLONOSCOPY;  Surgeon: Ladene Artist, MD,FACG;  Location: [facility] ENDOSCOPY;  Service: Endoscopy;  Laterality: N/A;  note the EGD is possible  . CORONARY ANGIOPLASTY WITH STENT PLACEMENT  2006   X 1 stent  . EPIDURAL BLOCK INJECTION    . ESOPHAGOGASTRODUODENOSCOPY  01/26/2012   Procedure: ESOPHAGOGASTRODUODENOSCOPY (EGD);  Surgeon: Ladene Artist, MD,FACG;  Location: [facility] ENDOSCOPY;  Service: Endoscopy;  Laterality: N/A;  . FINE NEEDLE ASPIRATION Right 09/26/2019   Procedure: FINE NEEDLE ASPIRATION;  Surgeon: Altamese [place], MD;  Location: [place];  Service: Orthopedics;  Laterality: Right;  . HEMIARTHROPLASTY HIP Right 2012  . IR EPIDUROGRAPHY  04/20/2019  . LAPAROSCOPIC CHOLECYSTECTOMY  2001  . LUMBAR FUSION  03/2013  . LUMBAR LAMINECTOMY/DECOMPRESSION MICRODISCECTOMY Right  11/04/2015   Procedure: Right Lumbar Five-Sacral One Microdiskectomy;  Surgeon: Kristeen Miss, MD;  Location: [place] NEURO ORS;  Service: Neurosurgery;  Laterality: Right;  Right L5-S1 Microdiskectomy  . moles removed that required stiches     one on leg and one on face  . ORIF FEMUR FRACTURE Right 09/26/2019   Procedure: OPEN REDUCTION INTERNAL FIXATION (ORIF) DISTAL FEMUR FRACTURE;  Surgeon: Altamese [place], MD;  Location: [place];  Service: Orthopedics;  Laterality: Right;  . ORIF FEMUR FRACTURE Right 10/17/2019   Procedure: OPEN REDUCTION INTERNAL FIXATION (ORIF) DISTAL FEMUR  FRACTURE;  Surgeon: Altamese [place], MD;  Location: [place];  Service: Orthopedics;  Laterality: Right;  . SUBXYPHOID PERICARDIAL WINDOW N/A 03/08/2019   Procedure: SUBXYPHOID PERICARDIAL WINDOW;  Surgeon: Ivin Poot, MD;  Location: [place];  Service: Thoracic;  Laterality: N/A;  . TEE WITHOUT CARDIOVERSION N/A 03/08/2019   Procedure: TRANSESOPHAGEAL ECHOCARDIOGRAM (TEE);  Surgeon: Prescott Gum, Collier Salina, MD;  Location: [place];  Service: Thoracic;  Laterality: N/A;  . TEMPORAL ARTERY BIOPSY / LIGATION Bilateral 2011  . TONSILLECTOMY AND ADENOIDECTOMY     at age 60  . TUBAL LIGATION  ~ 1982  . X-STOP IMPLANTATION  ~ 2010   "lower back" (10/28/2012)    There were no vitals filed for this visit.   Subjective Assessment - 07/24/20 1155    Subjective Patient has been in the hospital recently with a bout of delrium. per patients daughter she has been home and doing much better. Her daughter notes there her mobility has decreased significantly since her previous visits. The patient and her daughter are motivated to continue with therapuy to improve ability to perfrom ADL's    Pertinent History CAD, hypertension, history of temporal arteritis, atrial fibrillation, GERD, chronic back pain with multiple prior surgeries, osteoarthritis involving multiple joints    How long can you sit comfortably? N/a    How long can you stand comfortably? Not long    How long can you walk comfortably? 76' with RW (PLOF)    Patient Stated Goals Improve mobility and strength    Currently in Pain? No/denies   glut area hurts at times but she hasnt been moving much             [facility] PT Assessment - 07/25/20 0001      Strength   Overall Strength Comments 4-/5 grossly on R LE; 4+/5 on L LE      Ambulation/Gait   Ambulation/Gait Yes    Ambulation Distance (Feet) 30 Feet    Assistive device Rolling walker    Gait Pattern Step-through pattern;Decreased weight shift to right;Decreased step length - right;Decreased step  length - left;Right flexed knee in stance;Left flexed knee in stance;Trunk flexed    Ambulation Surface Level    Gait Comments As patient fatigued her knees started to buckel and she need increased support to remain standing.Marland Kitchen She was abe to make it back to the table      [employer] comment: not perfromed on re-assessment 2nd to time                         [facility] Adult PT Treatment/Exercise - 07/25/20 0001      Transfers   Comments Sit to stand 3x 20 sec and 30 sec 2x. As she fatigues her legs start to buckle but she can correct with cuing.      Self-Care   Self-Care Other Self-Care Comments  Other Self-Care Comments  reviewed results today vs results before she had her episdoe. Reviewed pOC going forward with the patient and her daughter.      Knee/Hip Exercises: Seated   Other Seated Knee/Hip Exercises Ankle dorsiflexion 2x10    Other Seated Knee/Hip Exercises seated march with yellow band 2x10 each leg; seated hip abduction bilateral      Knee/Hip Exercises: Supine   Quad Sets Limitations 2x10 5 sec hold    Other Supine Knee/Hip Exercises lower trunk rotation 2x10 3 sec hold on each side                  PT Education - 07/25/20 1239    Education Details reviewed HEP and safety with transfers    Person(s) Educated Patient    Methods Explanation;Demonstration;Tactile cues;Verbal cues;Handout    Comprehension Returned demonstration;Verbal cues required;Tactile cues required;Verbalized understanding;Need further instruction            PT Short Term Goals - 06/10/20 1157      PT SHORT TERM GOAL #1   Title Pt will be independent with HEP    Time 4    Period Weeks    Status Achieved    Target Date 06/10/20      PT SHORT TERM GOAL #2   Title Pt will be able to perform 5x STS in <15 sec to demo improved functional strength    Baseline 19 sec; 15 sec 06/10/20    Time 4    Period Weeks    Status Achieved    Target Date 06/10/20       PT SHORT TERM GOAL #3   Title Pt will be able to decrease TUG score by at least 10 sec to demo MCID    Baseline 40 sec; 28 sec on 06/10/20    Time 4    Period Weeks    Status Achieved    Target Date 06/10/20      PT SHORT TERM GOAL #4   Title Pt will be fully independent with transfers    Baseline Requires min A at times    Time 4    Period Weeks    Status Achieved    Target Date 06/10/20             PT Long Term Goals - 07/11/20 1153      PT LONG TERM GOAL #1   Title Pt will be independent with maintaining mobility at home    Time 8    Period Weeks    Status On-going      PT LONG TERM GOAL #2   Title Pt will have improved 5 x STS to </=13 sec to demo decreased fall risk    Baseline 19 sec    Time 8    Period Weeks    Status On-going    Target Date 09/05/20      PT LONG TERM GOAL #3   Title Pt will have improved TUG score to </=20 sec    Baseline 40 sec    Time 8    Period Weeks    Status On-going    Target Date 09/05/20      PT LONG TERM GOAL #4   Title Pt will have improved Berg Score to at least 37/56 to demo [place] for decreased fall risk    Baseline 29/56    Time 8    Period Weeks    Status On-going    Target Date 09/05/20  Plan - 07/24/20 1212    Clinical Impression Statement Patient came in for re-assessment following several medical problems. Per patients daughter the delerium has improved. The patient was AOx3 today. She followed all commands without difficulty other then baseline hearing deficits. From a mobility standpoint she has declined from last evalaution. She was unable to perfrom a 5x sit to stand test and her walking distance has decreased to 30' from 200'. As she fatigued her knees began to buckle. Therapy reviewed a baseline strengthening program with the patient and daughter to continue at home. She tolerated well. Prior to delerium she was reciving therapy 3x per week ( 2x land 1x water). Her daughter would like her to  continue this to improve her baseline mobility.    Personal Factors and Comorbidities Age;Fitness;Comorbidity 1;Comorbidity 2;Comorbidity 3+;Time since onset of injury/illness/exacerbation    Comorbidities CAD, hypertension, history of temporal arteritis, atrial fibrillation, GERD, chronic back pain with multiple prior surgeries, osteoarthritis involving multiple joints    Examination-Activity Limitations Bed Mobility;Bend;Locomotion Level;Transfers;Dressing;Stand;Squat    Examination-Participation Restrictions Community Activity;Cleaning;Shop;Laundry    Stability/Clinical Decision Making Evolving/Moderate complexity    Clinical Decision Making Moderate    Rehab Potential Good    PT Frequency 3x / week    PT Duration 8 weeks    PT Treatment/Interventions Aquatic Therapy;ADLs/Self Care Home Management;Cryotherapy;Electrical Stimulation;Biofeedback;Moist Heat;DME Instruction;Gait training;Stair training;Functional mobility training;Therapeutic activities;Therapeutic exercise;Balance training;Neuromuscular re-education;Manual techniques;Patient/family education;Orthotic Fit/Training;Dry needling;Taping;Vasopneumatic Device    PT Next Visit Plan Land: Continue to work on hip and quad strengthening. Manual therapy & strengthening as needed for L ankle DF. Work on endurance in upright posture and gait.  20th VISIT NOTE. (end of POC on 4/11)    PT Home Exercise Plan Access Code: [email or web address]; pt to work on standing for a total of at least 10 minutes in her day (can be split 2x5 min sessions or 5x2 min sessions)    Consulted and Agree with Plan of Care Patient;Family member/caregiver    Family Member Consulted Caregiver           Patient will benefit from skilled therapeutic intervention in order to improve the following deficits and impairments:  Abnormal gait,Decreased range of motion,Difficulty walking,Decreased endurance,Decreased activity tolerance,Pain,Decreased balance,Hypomobility,Improper body  mechanics,Decreased mobility,Decreased strength,Postural dysfunction  Visit Diagnosis: Muscle weakness (generalized)  Abnormal posture  Pain in right hip  Difficulty in walking, not elsewhere classified  Other abnormalities of gait and mobility     Problem List Patient Active Problem List   Diagnosis Date Noted  . Stroke-like symptoms 07/14/2020  . Cough 05/24/2020  . Dyspnea 05/24/2020  . Optic neuritis 05/24/2020  . Osteopenia 05/24/2020  . Rash 05/24/2020  . Vitamin D deficiency 05/24/2020  . Palliative care by specialist   . Goals of care, counseling/discussion   . DNR (do not resuscitate)   . Weakness   . Advanced directives, counseling/discussion   . Pressure injury of skin 10/18/2019  . Closed fracture of right distal femur ([place]) 10/17/2019  . Femur fracture, right ([place]) 09/25/2019  . Chronic diastolic CHF (congestive heart failure) ([place]) 09/25/2019  . HCAP (healthcare-associated pneumonia) 06/19/2019  . Encephalopathy 06/19/2019  . Lumbar degenerative disc disease   . Paroxysmal A-fib ([place])   . Hypomagnesemia   . C. difficile colitis   . Diarrhea 05/29/2019  . CHF (congestive heart failure) ([place]) 05/29/2019  . Recurrent falls 04/30/2019  . Pericardial effusion without cardiac tamponade 03/29/2019  . Hypoalbuminemia 03/27/2019  . Cardiac tamponade   . Pericarditis 03/06/2019  . Fever   .  Septic shock ([place])   . Atypical chest pain   . Atrial fibrillation with rapid ventricular response ([place])   . Nausea 01/17/2019  . Acute diverticulitis 01/13/2019  . Chronic back pain 01/13/2019  . Tachycardia 01/13/2019  . Hypokalemia 01/13/2019  . Chronic low back pain 07/10/2018  . Influenza A 05/20/2017  . Severe sepsis ([place]) 05/20/2017  . Acute respiratory failure with hypoxia ([place]) 05/20/2017  . CKD (chronic kidney disease), stage III ([place]) 05/20/2017  . PAF (paroxysmal atrial fibrillation) ([place]) 05/20/2017  . HTN (hypertension) 05/20/2017  . Giant cell  arteritis ([place]) 05/20/2017  . Coronary disease 05/20/2017  . Abnormal urinalysis 05/20/2017  . Osteoporosis 05/08/2016  . Herniated nucleus pulposus, L5-S1, right 11/04/2015  . Major depressive disorder, recurrent episode, moderate ([place]) 07/16/2013  . Abdominal pain, epigastric 06/22/2013  . Lumbar stenosis 04/25/2013  . Chronic insomnia 02/07/2013  . Elevated transaminase level 02/07/2013  . CVA (cerebral infarction) 10/29/2012  . Visual changes 10/28/2012  . Depression 10/28/2012  . Anxiety state 10/28/2012  . Anemia, unspecified 10/28/2012  . Nonspecific (abnormal) findings on radiological and other examination of gastrointestinal tract 01/25/2012  . Hyponatremia 01/24/2012  . Hematuria 01/24/2012  . Enteritis 12/24/2011  . Abdominal pain, other specified site 12/24/2011  . Leg pain, right 02/18/2011  . Rheumatoid arthritis ([place]) 02/04/2011  . Temporal arteritis ([place]) 09/01/2010  . Hypertension 09/01/2010  . Hyperlipidemia 09/01/2010  . History of atrial fibrillation 09/01/2010  . CAD (coronary artery disease) 09/01/2010  . GERD (gastroesophageal reflux disease) 09/01/2010  . Insomnia 09/01/2010    Carney Living PT DPT  07/25/2020, 1:37 PM  [place], [place], [address] Phone: [phone]   Fax:  [phone]  Name: Betty Jackson MRN: [id number] Date of Birth: [birth year]

## 2020-07-29 DIAGNOSIS — I5032 Chronic diastolic (congestive) heart failure: Secondary | ICD-10-CM | POA: Diagnosis not present

## 2020-07-29 DIAGNOSIS — M5127 Other intervertebral disc displacement, lumbosacral region: Secondary | ICD-10-CM | POA: Diagnosis not present

## 2020-07-29 DIAGNOSIS — R531 Weakness: Secondary | ICD-10-CM | POA: Diagnosis not present

## 2020-07-29 DIAGNOSIS — S72401A Unspecified fracture of lower end of right femur, initial encounter for closed fracture: Secondary | ICD-10-CM | POA: Diagnosis not present

## 2020-07-29 MED ORDER — HYDROCODONE-ACETAMINOPHEN 10-325 MG PO TABS: ORAL_TABLET | ORAL | 0 refills | Status: DC

## 2020-07-30 ENCOUNTER — Telehealth (INDEPENDENT_AMBULATORY_CARE_PROVIDER_SITE_OTHER): Payer: Medicare Other | Admitting: Family Medicine

## 2020-07-30 ENCOUNTER — Other Ambulatory Visit: Payer: Self-pay

## 2020-07-30 DIAGNOSIS — M316 Other giant cell arteritis: Secondary | ICD-10-CM | POA: Diagnosis not present

## 2020-07-30 DIAGNOSIS — F5104 Psychophysiologic insomnia: Secondary | ICD-10-CM | POA: Diagnosis not present

## 2020-07-30 DIAGNOSIS — M48061 Spinal stenosis, lumbar region without neurogenic claudication: Secondary | ICD-10-CM

## 2020-07-30 DIAGNOSIS — I480 Paroxysmal atrial fibrillation: Secondary | ICD-10-CM

## 2020-07-30 NOTE — Telephone Encounter (Signed)
Noted. Nothing further needed. 

## 2020-07-30 NOTE — Progress Notes (Signed)
Patient ID: Betty Jackson, female   DOB: 1934-09-04, 86 y.o.   MRN: [id number]   This visit type was conducted due to national recommendations for restrictions regarding the COVID-19 pandemic in an effort to limit this patient's exposure and mitigate transmission in our community.   Virtual Visit via Telephone Note  I connected with Betty Jackson on 07/30/20 at 11:30 AM EDT by telephone and verified that I am speaking with the correct person using two identifiers.   I discussed the limitations, risks, security and privacy concerns of performing an evaluation and management service by telephone and the availability of in person appointments. I also discussed with the patient that there may be a patient responsible charge related to this service. The patient expressed understanding and agreed to proceed.  Location patient: home Location provider: work or home office Participants present for the call: patient, provider, pt's daughter Betty Jackson. Patient did not have a visit in the prior 7 days to address this/these issue(s).   History of Present Illness:  Betty Jackson had some recent acute mental status changes.  Refer to note of 4-22/2022 for details.  She had presented to the ER on 07-14-2020 with question of expressive aphasia.  She had extensive work-up which did not reveal any evidence for stroke and there was some question of whether she might have had TIA.  She is on chronic Eliquis.  During that visit apparently her Betty Jackson patch for pain was taken off.  She then developed some tremors and increased intermittent confusion and was taken back for repeat ER assessment on the 19th.  In retrospect, it was felt that some of her agitation on the second visit may have been related to withdrawal from the fentanyl.  There was no evidence for significant metabolic derangement or infection.  She was having tremendous problems with agitation and some confusion.  We added low-dose Abilify which did not seem to make  any difference with her sleep during the past week.  In fact, she had a couple nights that she did not sleep much at all.  Last night they gave her extra hydrocodone and she slept much better through the night.  She seems to be doing well currently with regimen of taking 1 hydrocodone in the morning and 1 at night.  She does have frequent breakthrough pain without the hydrocodone.  Family currently in process of trying to find placement because of her increasing needs.  They have 8 applications outstanding.  Past Medical History:  Diagnosis Date  . Acute respiratory failure with hypoxia ([place]) 05/20/2017  . Anemia, unspecified 10/28/2012  . Anxiety state, unspecified 10/28/2012  . CAD (coronary artery disease)    a. Stent RCA in [facility];  b. Cath approx 2009 - nonobs per pt report.  . Cataract    immature on the left eye  . Chronic insomnia 02/07/2013  . Chronic lower back pain    scoliosis  . CKD (chronic kidney disease) stage 3, GFR 30-59 ml/min (HCC)   . CVA (cerebral infarction) 10/29/2012  . DDD (degenerative disc disease)   . Diverticulosis   . GERD (gastroesophageal reflux disease) 09/01/2010  . Hemorrhoids   . Herniated nucleus pulposus, L5-S1, right 11/04/2015  . Hyperlipidemia    takes Lipitor daily  . Hypertension    takes Amlodipine,Losartan,Metoprolol,and HCTZ daily  . Incontinence of urine   . Insomnia    takes Restoril nightly  . Lumbar stenosis 04/25/2013  . Major depressive disorder, recurrent episode, moderate ([place]) 07/16/2013  .  Osteoporosis   . PAF (paroxysmal atrial fibrillation) ([place]) 2011   a. lone epidode in 2011 according to notes.  . Rheumatoid arthritis ([place])   . Scoliosis   . Sepsis ([place]) 05/2019  . Temporal arteritis ([place]) 2011   a. followed @ [facility]; potential flareup 10/28/2012/notes 10/28/2012  . Vocal cord dysfunction    "they don't operate properly" (10/28/2012)   Past Surgical History:  Procedure Laterality Date  . ABDOMINAL HYSTERECTOMY  ~ 1984    vaginally  . BACK SURGERY  7-18yrs ago   X Stop  . BLADDER SUSPENSION  2001  . BREAST BIOPSY Right   . CATARACT EXTRACTION W/ INTRAOCULAR LENS IMPLANT Right ~ 08/2012  . CHEST TUBE INSERTION Left 03/08/2019   Procedure: Chest Tube Insertion;  Surgeon: Ivin Poot, MD;  Location: [place];  Service: Thoracic;  Laterality: Left;  . COLONOSCOPY  01/26/2012   Procedure: COLONOSCOPY;  Surgeon: Ladene Artist, MD,FACG;  Location: [facility] ENDOSCOPY;  Service: Endoscopy;  Laterality: N/A;  note the EGD is possible  . CORONARY ANGIOPLASTY WITH STENT PLACEMENT  2006   X 1 stent  . EPIDURAL BLOCK INJECTION    . ESOPHAGOGASTRODUODENOSCOPY  01/26/2012   Procedure: ESOPHAGOGASTRODUODENOSCOPY (EGD);  Surgeon: Ladene Artist, MD,FACG;  Location: [facility] ENDOSCOPY;  Service: Endoscopy;  Laterality: N/A;  . FINE NEEDLE ASPIRATION Right 09/26/2019   Procedure: FINE NEEDLE ASPIRATION;  Surgeon: Altamese [place], MD;  Location: [facility];  Service: Orthopedics;  Laterality: Right;  . HEMIARTHROPLASTY HIP Right 2012  . IR EPIDUROGRAPHY  04/20/2019  . LAPAROSCOPIC CHOLECYSTECTOMY  2001  . LUMBAR FUSION  03/2013  . LUMBAR LAMINECTOMY/DECOMPRESSION MICRODISCECTOMY Right 11/04/2015   Procedure: Right Lumbar Five-Sacral One Microdiskectomy;  Surgeon: Kristeen Miss, MD;  Location: [place] NEURO ORS;  Service: Neurosurgery;  Laterality: Right;  Right L5-S1 Microdiskectomy  . moles removed that required stiches     one on leg and one on face  . ORIF FEMUR FRACTURE Right 09/26/2019   Procedure: OPEN REDUCTION INTERNAL FIXATION (ORIF) DISTAL FEMUR FRACTURE;  Surgeon: Altamese [place], MD;  Location: [place];  Service: Orthopedics;  Laterality: Right;  . ORIF FEMUR FRACTURE Right 10/17/2019   Procedure: OPEN REDUCTION INTERNAL FIXATION (ORIF) DISTAL FEMUR FRACTURE;  Surgeon: Altamese [place], MD;  Location: [place];  Service: Orthopedics;  Laterality: Right;  . SUBXYPHOID PERICARDIAL WINDOW N/A 03/08/2019   Procedure: SUBXYPHOID PERICARDIAL WINDOW;   Surgeon: Ivin Poot, MD;  Location: [place];  Service: Thoracic;  Laterality: N/A;  . TEE WITHOUT CARDIOVERSION N/A 03/08/2019   Procedure: TRANSESOPHAGEAL ECHOCARDIOGRAM (TEE);  Surgeon: Prescott Gum, Collier Salina, MD;  Location: [place];  Service: Thoracic;  Laterality: N/A;  . TEMPORAL ARTERY BIOPSY / LIGATION Bilateral 2011  . TONSILLECTOMY AND ADENOIDECTOMY     at age 35  . TUBAL LIGATION  ~ 1982  . X-STOP IMPLANTATION  ~ 2010   "lower back" (10/28/2012)    reports that she has never smoked. She has never used smokeless tobacco. She reports current alcohol use. She reports that she does not use drugs. family history includes Brain cancer (age of onset: 36) in her mother; Breast cancer (age of onset: 80) in her daughter; Heart attack in her father; Heart disease in an other family member; Hypertension in an other family member. Allergies  Allergen Reactions  . Dextromethorphan Rash      Observations/Objective: Patient sounds cheerful and well on the phone. I do not appreciate any SOB. Speech and thought processing are grossly intact. Patient reported vitals:  Assessment and Plan:  #1 acute on chronic insomnia.  Certainly seems that at least some component of her insomnia may be related to recent pain issues.  She slept much better last night with 1 hydrocodone. -Continue to avoid sedative hypnotics and over-the-counter anticholinergic medication such as diphenhydramine -We have agreed that regimen of hydrocodone 10 mg twice daily might be best to supplement for breakthrough pain.  We will need to back off promptly if she has signs of excessive sedation.  #2 recent delirium/confusion.  Question of recent TIA.  Probably also had some exacerbation of confusion because of her Betty Jackson inadvertently being stopped.  She seems much clear at this time and we recommended stopping Abilify -We did discuss that she continue to have severe insomnia with agitation could consider low-dose Seroquel even  though we have discussed this does have blackbox warning of increased risk of cardiovascular death  #3 chronic atrial fibrillation on Eliquis  #4 history of chronic lumbar stenosis with chronic severe back pain on chronic pain medication  #5 history of temporal arteritis.  Patient still maintained on low-dose prednisone  #6 history of CAD  Follow Up Instructions:  -Daughter will give some feedback over the next couple days regarding how her sleep is going with taking the hydrocodone at night   [id number] 5-10 [id number] 11-20 [id number] 21-30 I did not refer this patient for an OV in the next 24 hours for this/these issue(s).  I discussed the assessment and treatment plan with the patient. The patient was provided an opportunity to ask questions and all were answered. The patient agreed with the plan and demonstrated an understanding of the instructions.   The patient was advised to call back or seek an in-person evaluation if the symptoms worsen or if the condition fails to improve as anticipated.  I provided 30 minutes of non-face-to-face time during this encounter.   Carolann Littler, MD

## 2020-07-31 ENCOUNTER — Encounter (HOSPITAL_BASED_OUTPATIENT_CLINIC_OR_DEPARTMENT_OTHER): Payer: Medicare Other | Admitting: Physical Therapy

## 2020-07-31 ENCOUNTER — Ambulatory Visit (HOSPITAL_BASED_OUTPATIENT_CLINIC_OR_DEPARTMENT_OTHER): Payer: Medicare Other | Attending: Orthopedic Surgery | Admitting: Physical Therapy

## 2020-07-31 ENCOUNTER — Encounter (HOSPITAL_BASED_OUTPATIENT_CLINIC_OR_DEPARTMENT_OTHER): Payer: Self-pay | Admitting: Physical Therapy

## 2020-07-31 DIAGNOSIS — R262 Difficulty in walking, not elsewhere classified: Secondary | ICD-10-CM | POA: Diagnosis not present

## 2020-07-31 DIAGNOSIS — R2689 Other abnormalities of gait and mobility: Secondary | ICD-10-CM | POA: Insufficient documentation

## 2020-07-31 DIAGNOSIS — M6281 Muscle weakness (generalized): Secondary | ICD-10-CM | POA: Diagnosis not present

## 2020-07-31 DIAGNOSIS — M25551 Pain in right hip: Secondary | ICD-10-CM | POA: Diagnosis not present

## 2020-07-31 DIAGNOSIS — R293 Abnormal posture: Secondary | ICD-10-CM | POA: Insufficient documentation

## 2020-07-31 NOTE — Therapy (Signed)
[place], [place], [address] Phone: [phone]   Fax:  [phone]  Physical Therapy Treatment  Patient Details  Name: Betty Jackson MRN: [id number] Date of Birth: 1934-12-19 Referring Provider (PT): Altamese Hollywood, MD   Encounter Date: 07/31/2020   PT End of Session - 07/31/20 2122    Visit Number 22    Number of Visits 39    Date for PT Re-Evaluation 09/05/20    Authorization Type U[employer] Medicare    PT Start Time 1015    PT Stop Time 1056    PT Time Calculation (min) 41 min    Activity Tolerance Patient tolerated treatment well    Behavior During Therapy [facility] for tasks assessed/performed           Past Medical History:  Diagnosis Date  . Acute respiratory failure with hypoxia ([place]) 05/20/2017  . Anemia, unspecified 10/28/2012  . Anxiety state, unspecified 10/28/2012  . CAD (coronary artery disease)    a. Stent RCA in [facility];  b. Cath approx 2009 - nonobs per pt report.  . Cataract    immature on the left eye  . Chronic insomnia 02/07/2013  . Chronic lower back pain    scoliosis  . CKD (chronic kidney disease) stage 3, GFR 30-59 ml/min (HCC)   . CVA (cerebral infarction) 10/29/2012  . DDD (degenerative disc disease)   . Diverticulosis   . GERD (gastroesophageal reflux disease) 09/01/2010  . Hemorrhoids   . Herniated nucleus pulposus, L5-S1, right 11/04/2015  . Hyperlipidemia    takes Lipitor daily  . Hypertension    takes Amlodipine,Losartan,Metoprolol,and HCTZ daily  . Incontinence of urine   . Insomnia    takes Restoril nightly  . Lumbar stenosis 04/25/2013  . Major depressive disorder, recurrent episode, moderate ([place]) 07/16/2013  . Osteoporosis   . PAF (paroxysmal atrial fibrillation) ([place]) 2011   a. lone epidode in 2011 according to notes.  . Rheumatoid arthritis ([place])   . Scoliosis   . Sepsis ([place]) 05/2019  . Temporal arteritis ([place]) 2011   a. followed @ [facility]; potential flareup  10/28/2012/notes 10/28/2012  . Vocal cord dysfunction    "they don't operate properly" (10/28/2012)    Past Surgical History:  Procedure Laterality Date  . ABDOMINAL HYSTERECTOMY  ~ 1984   vaginally  . BACK SURGERY  7-15yrs ago   X Stop  . BLADDER SUSPENSION  2001  . BREAST BIOPSY Right   . CATARACT EXTRACTION W/ INTRAOCULAR LENS IMPLANT Right ~ 08/2012  . CHEST TUBE INSERTION Left 03/08/2019   Procedure: Chest Tube Insertion;  Surgeon: Ivin Poot, MD;  Location: [place];  Service: Thoracic;  Laterality: Left;  . COLONOSCOPY  01/26/2012   Procedure: COLONOSCOPY;  Surgeon: Ladene Artist, MD,FACG;  Location: [facility] ENDOSCOPY;  Service: Endoscopy;  Laterality: N/A;  note the EGD is possible  . CORONARY ANGIOPLASTY WITH STENT PLACEMENT  2006   X 1 stent  . EPIDURAL BLOCK INJECTION    . ESOPHAGOGASTRODUODENOSCOPY  01/26/2012   Procedure: ESOPHAGOGASTRODUODENOSCOPY (EGD);  Surgeon: Ladene Artist, MD,FACG;  Location: [facility] ENDOSCOPY;  Service: Endoscopy;  Laterality: N/A;  . FINE NEEDLE ASPIRATION Right 09/26/2019   Procedure: FINE NEEDLE ASPIRATION;  Surgeon: Altamese Mason, MD;  Location: [place];  Service: Orthopedics;  Laterality: Right;  . HEMIARTHROPLASTY HIP Right 2012  . IR EPIDUROGRAPHY  04/20/2019  . LAPAROSCOPIC CHOLECYSTECTOMY  2001  . LUMBAR FUSION  03/2013  . LUMBAR LAMINECTOMY/DECOMPRESSION MICRODISCECTOMY Right  11/04/2015   Procedure: Right Lumbar Five-Sacral One Microdiskectomy;  Surgeon: Kristeen Miss, MD;  Location: [place] NEURO ORS;  Service: Neurosurgery;  Laterality: Right;  Right L5-S1 Microdiskectomy  . moles removed that required stiches     one on leg and one on face  . ORIF FEMUR FRACTURE Right 09/26/2019   Procedure: OPEN REDUCTION INTERNAL FIXATION (ORIF) DISTAL FEMUR FRACTURE;  Surgeon: Altamese [place], MD;  Location: [facility];  Service: Orthopedics;  Laterality: Right;  . ORIF FEMUR FRACTURE Right 10/17/2019   Procedure: OPEN REDUCTION INTERNAL FIXATION (ORIF) DISTAL FEMUR FRACTURE;   Surgeon: Altamese [place], MD;  Location: [facility];  Service: Orthopedics;  Laterality: Right;  . SUBXYPHOID PERICARDIAL WINDOW N/A 03/08/2019   Procedure: SUBXYPHOID PERICARDIAL WINDOW;  Surgeon: Ivin Poot, MD;  Location: [place];  Service: Thoracic;  Laterality: N/A;  . TEE WITHOUT CARDIOVERSION N/A 03/08/2019   Procedure: TRANSESOPHAGEAL ECHOCARDIOGRAM (TEE);  Surgeon: Prescott Gum, Collier [place], MD;  Location: [place];  Service: Thoracic;  Laterality: N/A;  . TEMPORAL ARTERY BIOPSY / LIGATION Bilateral 2011  . TONSILLECTOMY AND ADENOIDECTOMY     at age 86  . TUBAL LIGATION  ~ 1982  . X-STOP IMPLANTATION  ~ 2010   "lower back" (10/28/2012)    There were no vitals filed for this visit.   Subjective Assessment - 07/31/20 2116    Subjective Patient  reports her left hip has hurt her. Per patients aughter she has been trying to work on her exercises but she has only been able to do them a few time. She continues to be limited in her mobility at home.    Pertinent History CAD, hypertension, history of temporal arteritis, atrial fibrillation, GERD, chronic back pain with multiple prior surgeries, osteoarthritis involving multiple joints    Limitations Standing    How long can you sit comfortably? N/a    How long can you stand comfortably? Not long    How long can you walk comfortably? 48' with RW (PLOF)    Patient Stated Goals Improve mobility and strength    Currently in Pain? Yes   per face pain scale 4/10   Pain Score 4     Pain Location Hip    Pain Orientation Right    Pain Descriptors / Indicators Aching    Pain Type Acute pain    Pain Onset 1 to 4 weeks ago    Pain Frequency Constant    Aggravating Factors  standing and walking    Pain Relieving Factors pain relivers    Effect of Pain on Daily Activities decreased ability to stand and walk                             [facility] Adult PT Treatment/Exercise - 07/31/20 0001      Knee/Hip Exercises: Stretches   Piriformis Stretch  Limitations 3x20 sec hold    Other Knee/Hip Stretches LTR x30 sec bilat      Knee/Hip Exercises: Supine   Quad Sets Limitations 2x10 5 sec hold    Heel Slides AROM;Right;10 reps;2 sets    Other Supine Knee/Hip Exercises bilateral er yellow 2x10; horixzotal abduction 2x10 yellow. patient required max verbal and tactile cuing; ball squeeze x20; heel raises x20; hip abdcution 2x10 yellow; seated mrch 2x10 all with cuing.      Manual Therapy   Joint Mobilization LAD grade 1 and II with occilations    Soft tissue mobilization to lower lumbar spine and  upper gluteal in side lying                  PT Education - 07/31/20 2121    Education Details updated HEP. Reviewed with dauget    Person(s) Educated Patient    Methods Explanation;Demonstration;Tactile cues;Verbal cues    Comprehension Verbal cues required;Tactile cues required;Need further instruction            PT Short Term Goals - 06/10/20 1157      PT SHORT TERM GOAL #1   Title Pt will be independent with HEP    Time 4    Period Weeks    Status Achieved    Target Date 06/10/20      PT SHORT TERM GOAL #2   Title Pt will be able to perform 5x STS in <15 sec to demo improved functional strength    Baseline 19 sec; 15 sec 06/10/20    Time 4    Period Weeks    Status Achieved    Target Date 06/10/20      PT SHORT TERM GOAL #3   Title Pt will be able to decrease TUG score by at least 10 sec to demo MCID    Baseline 40 sec; 28 sec on 06/10/20    Time 4    Period Weeks    Status Achieved    Target Date 06/10/20      PT SHORT TERM GOAL #4   Title Pt will be fully independent with transfers    Baseline Requires min A at times    Time 4    Period Weeks    Status Achieved    Target Date 06/10/20             PT Long Term Goals - 07/11/20 1153      PT LONG TERM GOAL #1   Title Pt will be independent with maintaining mobility at home    Time 8    Period Weeks    Status On-going      PT LONG TERM GOAL #2    Title Pt will have improved 5 x STS to </=13 sec to demo decreased fall risk    Baseline 19 sec    Time 8    Period Weeks    Status On-going    Target Date 09/05/20      PT LONG TERM GOAL #3   Title Pt will have improved TUG score to </=20 sec    Baseline 40 sec    Time 8    Period Weeks    Status On-going    Target Date 09/05/20      PT LONG TERM GOAL #4   Title Pt will have improved Berg Score to at least 37/56 to demo Media for decreased fall risk    Baseline 29/56    Time 8    Period Weeks    Status On-going    Target Date 09/05/20                 Plan - 07/31/20 2122    Clinical Impression Statement Patient required max verbal and tactile cues today to eprfrom basic ther-ex.When therapy stoped with tactile cues she stoped doing the exercises. Therapy attmepted manual therapy to reduce pain. She had a mild improvement with LAD. She repoted pain with trigger point release but she was able to tolerate. She reported no major improvement in pain with trigger point release. Daughter was made aware of the patients difficutly in  following simple commands. Her daughter will continue to monitor the patient. She will not have therapy next week but is schedueld 2x next week. We will continue to follow the patient.    Personal Factors and Comorbidities Age;Fitness;Comorbidity 1;Comorbidity 2;Comorbidity 3+;Time since onset of injury/illness/exacerbation    Comorbidities CAD, hypertension, history of temporal arteritis, atrial fibrillation, GERD, chronic back pain with multiple prior surgeries, osteoarthritis involving multiple joints    Examination-Activity Limitations Bed Mobility;Bend;Locomotion Level;Transfers;Dressing;Stand;Squat    Examination-Participation Restrictions Community Activity;Cleaning;Shop;Laundry    Stability/Clinical Decision Making Evolving/Moderate complexity    Clinical Decision Making Moderate    Rehab Potential Good    PT Frequency 3x / week    PT Duration 8  weeks    PT Treatment/Interventions Aquatic Therapy;ADLs/Self Care Home Management;Cryotherapy;Electrical Stimulation;Biofeedback;Moist Heat;DME Instruction;Gait training;Stair training;Functional mobility training;Therapeutic activities;Therapeutic exercise;Balance training;Neuromuscular re-education;Manual techniques;Patient/family education;Orthotic Fit/Training;Dry needling;Taping;Vasopneumatic Device    PT Next Visit Plan [place]: Continue to work on hip and quad strengthening. Manual therapy & strengthening as needed for L ankle DF. Work on endurance in upright posture and gait.  20th VISIT NOTE. (end of POC on 4/11)    PT Home Exercise Plan ccess Code: [email or web address]  URL: [email or web address]  Date: 07/31/2020  Prepared by: Carolyne Littles    Exercises  Seated Knee Extension with Resistance - 1 x daily - 7 x weekly - 3 sets - 10 reps  Seated Isometric Hip Adduction with Ball - 1 x daily - 7 x weekly - 3 sets - 10 reps  Shoulder External Rotation and Scapular Retraction with Resistance - 1 x daily - 7 x weekly - 3 sets - 10 reps  Low Horizontal Abduction with Resistance - 1 x daily - 7 x weekly - 3 sets - 10 reps  Seated Heel Raise - 1 x daily - 7 x weekly - 3 sets - 10 reps  Seated March with Resistance - 1 x daily - 7 x weekly - 3 sets - 10 reps    Consulted and Agree with Plan of Care Patient;Family member/caregiver    Family Member Consulted Caregiver           Patient will benefit from skilled therapeutic intervention in order to improve the following deficits and impairments:  Abnormal gait,Decreased range of motion,Difficulty walking,Decreased endurance,Decreased activity tolerance,Pain,Decreased balance,Hypomobility,Improper body mechanics,Decreased mobility,Decreased strength,Postural dysfunction  Visit Diagnosis: Muscle weakness (generalized)  Abnormal posture  Pain in right hip  Difficulty in walking, not elsewhere classified  Other abnormalities of gait and  mobility     Problem List Patient Active Problem List   Diagnosis Date Noted  . Stroke-like symptoms 07/14/2020  . Cough 05/24/2020  . Dyspnea 05/24/2020  . Optic neuritis 05/24/2020  . Osteopenia 05/24/2020  . Rash 05/24/2020  . Vitamin D deficiency 05/24/2020  . Palliative care by specialist   . Goals of care, counseling/discussion   . DNR (do not resuscitate)   . Weakness   . Advanced directives, counseling/discussion   . Pressure injury of skin 10/18/2019  . Closed fracture of right distal femur ([place]) 10/17/2019  . Femur fracture, right ([place]) 09/25/2019  . Chronic diastolic CHF (congestive heart failure) ([place]) 09/25/2019  . HCAP (healthcare-associated pneumonia) 06/19/2019  . Encephalopathy 06/19/2019  . Lumbar degenerative disc disease   . Paroxysmal A-fib ([place])   . Hypomagnesemia   . C. difficile colitis   . Diarrhea 05/29/2019  . CHF (congestive heart failure) ([place]) 05/29/2019  . Recurrent falls 04/30/2019  . Pericardial effusion without cardiac tamponade 03/29/2019  . Hypoalbuminemia  03/27/2019  . Cardiac tamponade   . Pericarditis 03/06/2019  . Fever   . Septic shock ([place])   . Atypical chest pain   . Atrial fibrillation with rapid ventricular response ([place])   . Nausea 01/17/2019  . Acute diverticulitis 01/13/2019  . Chronic back pain 01/13/2019  . Tachycardia 01/13/2019  . Hypokalemia 01/13/2019  . Chronic low back pain 07/10/2018  . Influenza A 05/20/2017  . Severe sepsis ([place]) 05/20/2017  . Acute respiratory failure with hypoxia ([place]) 05/20/2017  . CKD (chronic kidney disease), stage III ([place]) 05/20/2017  . PAF (paroxysmal atrial fibrillation) ([place]) 05/20/2017  . HTN (hypertension) 05/20/2017  . Giant cell arteritis ([place]) 05/20/2017  . Coronary disease 05/20/2017  . Abnormal urinalysis 05/20/2017  . Osteoporosis 05/08/2016  . Herniated nucleus pulposus, L5-S1, right 11/04/2015  . Major depressive disorder, recurrent episode, moderate ([place])  07/16/2013  . Abdominal pain, epigastric 06/22/2013  . Lumbar stenosis 04/25/2013  . Chronic insomnia 02/07/2013  . Elevated transaminase level 02/07/2013  . CVA (cerebral infarction) 10/29/2012  . Visual changes 10/28/2012  . Depression 10/28/2012  . Anxiety state 10/28/2012  . Anemia, unspecified 10/28/2012  . Nonspecific (abnormal) findings on radiological and other examination of gastrointestinal tract 01/25/2012  . Hyponatremia 01/24/2012  . Hematuria 01/24/2012  . Enteritis 12/24/2011  . Abdominal pain, other specified site 12/24/2011  . Leg pain, right 02/18/2011  . Rheumatoid arthritis ([place]) 02/04/2011  . Temporal arteritis ([place]) 09/01/2010  . Hypertension 09/01/2010  . Hyperlipidemia 09/01/2010  . History of atrial fibrillation 09/01/2010  . CAD (coronary artery disease) 09/01/2010  . GERD (gastroesophageal reflux disease) 09/01/2010  . Insomnia 09/01/2010    Carney Living  PT DPT  07/31/2020, 9:33 PM  [facility] [address] [place], [place], [address] Phone: [phone]   Fax:  [phone]  Name: MAEDELL HEDGER MRN: [id number] Date of Birth: [birth year]

## 2020-08-01 MED ORDER — QUETIAPINE FUMARATE 25 MG PO TABS: 25.0000 mg | ORAL_TABLET | Freq: Every day | ORAL | 5 refills | Status: DC

## 2020-08-01 NOTE — Telephone Encounter (Signed)
Continue to hold Abilify.   I sent in Seroquel 25 mg po qhs and she can start tonight.

## 2020-08-01 NOTE — Addendum Note (Signed)
Addended by: Eulas Post on: 08/01/2020 03:51 PM   Modules accepted: Orders

## 2020-08-10 ENCOUNTER — Other Ambulatory Visit: Payer: Self-pay | Admitting: Cardiovascular Disease

## 2020-08-12 ENCOUNTER — Encounter (HOSPITAL_BASED_OUTPATIENT_CLINIC_OR_DEPARTMENT_OTHER): Payer: Self-pay | Admitting: Physical Therapy

## 2020-08-12 ENCOUNTER — Ambulatory Visit (HOSPITAL_BASED_OUTPATIENT_CLINIC_OR_DEPARTMENT_OTHER): Payer: Medicare Other | Admitting: Physical Therapy

## 2020-08-12 ENCOUNTER — Other Ambulatory Visit: Payer: Self-pay

## 2020-08-12 DIAGNOSIS — M6281 Muscle weakness (generalized): Secondary | ICD-10-CM

## 2020-08-12 DIAGNOSIS — R262 Difficulty in walking, not elsewhere classified: Secondary | ICD-10-CM

## 2020-08-12 DIAGNOSIS — M25551 Pain in right hip: Secondary | ICD-10-CM | POA: Diagnosis not present

## 2020-08-12 DIAGNOSIS — R293 Abnormal posture: Secondary | ICD-10-CM | POA: Diagnosis not present

## 2020-08-12 DIAGNOSIS — R2689 Other abnormalities of gait and mobility: Secondary | ICD-10-CM | POA: Diagnosis not present

## 2020-08-12 NOTE — Therapy (Signed)
[place], [place], [address] Phone: [phone]   Fax:  [phone]  Physical Therapy Treatment  Patient Details  Name: Betty Jackson MRN: [id number] Date of Birth: 18-Mar-1935 Referring Provider (PT): Altamese [place], MD   Encounter Date: 08/12/2020   PT End of Session - 08/12/20 1257    Visit Number 23    Number of Visits 39    Date for PT Re-Evaluation 09/05/20    Authorization Type UHC Medicare    PT Start Time 1100    PT Stop Time 1143    PT Time Calculation (min) 43 min    Activity Tolerance Patient tolerated treatment well    Behavior During Therapy [facility] for tasks assessed/performed           Past Medical History:  Diagnosis Date  . Acute respiratory failure with hypoxia ([place]) 05/20/2017  . Anemia, unspecified 10/28/2012  . Anxiety state, unspecified 10/28/2012  . CAD (coronary artery disease)    a. Stent RCA in [facility];  b. Cath approx 2009 - nonobs per pt report.  . Cataract    immature on the left eye  . Chronic insomnia 02/07/2013  . Chronic lower back pain    scoliosis  . CKD (chronic kidney disease) stage 3, GFR 30-59 ml/min (HCC)   . CVA (cerebral infarction) 10/29/2012  . DDD (degenerative disc disease)   . Diverticulosis   . GERD (gastroesophageal reflux disease) 09/01/2010  . Hemorrhoids   . Herniated nucleus pulposus, L5-S1, right 11/04/2015  . Hyperlipidemia    takes Lipitor daily  . Hypertension    takes Amlodipine,Losartan,Metoprolol,and HCTZ daily  . Incontinence of urine   . Insomnia    takes Restoril nightly  . Lumbar stenosis 04/25/2013  . Major depressive disorder, recurrent episode, moderate ([place]) 07/16/2013  . Osteoporosis   . PAF (paroxysmal atrial fibrillation) ([place]) 2011   a. lone epidode in 2011 according to notes.  . Rheumatoid arthritis ([place])   . Scoliosis   . Sepsis ([place]) 05/2019  . Temporal arteritis ([place]) 2011   a. followed @ [facility]; potential  flareup 10/28/2012/notes 10/28/2012  . Vocal cord dysfunction    "they don't operate properly" (10/28/2012)    Past Surgical History:  Procedure Laterality Date  . ABDOMINAL HYSTERECTOMY  ~ 1984   vaginally  . BACK SURGERY  7-23yrs ago   X Stop  . BLADDER SUSPENSION  2001  . BREAST BIOPSY Right   . CATARACT EXTRACTION W/ INTRAOCULAR LENS IMPLANT Right ~ 08/2012  . CHEST TUBE INSERTION Left 03/08/2019   Procedure: Chest Tube Insertion;  Surgeon: Ivin Poot, MD;  Location: [place];  Service: Thoracic;  Laterality: Left;  . COLONOSCOPY  01/26/2012   Procedure: COLONOSCOPY;  Surgeon: Ladene Artist, MD,FACG;  Location[facility] ENDOSCOPY;  Service: Endoscopy;  Laterality: N/A;  note the EGD is possible  . CORONARY ANGIOPLASTY WITH STENT PLACEMENT  2006   X 1 stent  . EPIDURAL BLOCK INJECTION    . ESOPHAGOGASTRODUODENOSCOPY  01/26/2012   Procedure: ESOPHAGOGASTRODUODENOSCOPY (EGD);  Surgeon: Ladene Artist, MD,FACG;  Location: [facility] ENDOSCOPY;  Service: Endoscopy;  Laterality: N/A;  . FINE NEEDLE ASPIRATION Right 09/26/2019   Procedure: FINE NEEDLE ASPIRATION;  Surgeon: Altamese [place], MD;  Location: [place];  Service: Orthopedics;  Laterality: Right;  . HEMIARTHROPLASTY HIP Right 2012  . IR EPIDUROGRAPHY  04/20/2019  . LAPAROSCOPIC CHOLECYSTECTOMY  2001  . LUMBAR FUSION  03/2013  . LUMBAR LAMINECTOMY/DECOMPRESSION MICRODISCECTOMY Right  11/04/2015   Procedure: Right Lumbar Five-Sacral One Microdiskectomy;  Surgeon: Kristeen Miss, MD;  Location: [place] NEURO ORS;  Service: Neurosurgery;  Laterality: Right;  Right L5-S1 Microdiskectomy  . moles removed that required stiches     one on leg and one on face  . ORIF FEMUR FRACTURE Right 09/26/2019   Procedure: OPEN REDUCTION INTERNAL FIXATION (ORIF) DISTAL FEMUR FRACTURE;  Surgeon: Altamese [place], MD;  Location: [place];  Service: Orthopedics;  Laterality: Right;  . ORIF FEMUR FRACTURE Right 10/17/2019   Procedure: OPEN REDUCTION INTERNAL FIXATION (ORIF) DISTAL FEMUR  FRACTURE;  Surgeon: Altamese [place], MD;  Location: [place];  Service: Orthopedics;  Laterality: Right;  . SUBXYPHOID PERICARDIAL WINDOW N/A 03/08/2019   Procedure: SUBXYPHOID PERICARDIAL WINDOW;  Surgeon: Ivin Poot, MD;  Location: [place];  Service: Thoracic;  Laterality: N/A;  . TEE WITHOUT CARDIOVERSION N/A 03/08/2019   Procedure: TRANSESOPHAGEAL ECHOCARDIOGRAM (TEE);  Surgeon: Prescott Gum, Collier [place]ina, MD;  Location: [place];  Service: Thoracic;  Laterality: N/A;  . TEMPORAL ARTERY BIOPSY / LIGATION Bilateral 2011  . TONSILLECTOMY AND ADENOIDECTOMY     at age 47  . TUBAL LIGATION  ~ 1982  . X-STOP IMPLANTATION  ~ 2010   "lower back" (10/28/2012)    There were no vitals filed for this visit.   Subjective Assessment - 08/12/20 1137    Subjective Patient continues to report pain in her right hip. She reports it is sore today. Overall it has been about the same.    Pertinent History CAD, hypertension, history of temporal arteritis, atrial fibrillation, GERD, chronic back pain with multiple prior surgeries, osteoarthritis involving multiple joints    Limitations Standing    How long can you sit comfortably? N/a    How long can you stand comfortably? Not long    How long can you walk comfortably? 28' with RW (PLOF)    Patient Stated Goals Improve mobility and strength    Currently in Pain? Yes   faces pain scale   Pain Score 4     Pain Location Nare    Pain Orientation Right    Pain Descriptors / Indicators Aching    Pain Type Acute pain    Pain Radiating Towards none    Pain Onset 1 to 4 weeks ago    Pain Frequency Constant    Aggravating Factors  standing and walking    Pain Relieving Factors pain relievers    Effect of Pain on Daily Activities decreased ability to stand and walking                             [facility] Adult PT Treatment/Exercise - 08/12/20 0001      Transfers   Comments sit to stand x5 with cuing for technique      Knee/Hip Exercises: Stretches   Other  Knee/Hip Stretches LTR 3x20 sec hold      Knee/Hip Exercises: Aerobic   Nustep L5 x 5 min      Knee/Hip Exercises: Standing   Heel Raises Limitations 3x10    Other Standing Knee Exercises standing slow march 3x10      Knee/Hip Exercises: Seated   Long Arc Quad Strengthening;Both;2 sets;10 reps    Other Seated Knee/Hip Exercises ball squeeze 3x10    Abd/Adduction Limitations bilateral hip abduction 3x10 yellow      Knee/Hip Exercises: Supine   Quad Sets Limitations 2x10 5 sec hold    Heel Slides AROM;Right;10 reps;2  sets      Manual Therapy   Joint Mobilization LAD grade 1 and II with occilations    Soft tissue mobilization to lower lumbar spine and upper gluteal in side lying                  PT Education - 08/12/20 1213    Education Details technique with standing exercises    Person(s) Educated Patient    Methods Explanation;Tactile cues;Demonstration;Verbal cues    Comprehension Verbalized understanding;Verbal cues required;Tactile cues required;Need further instruction            PT Short Term Goals - 06/10/20 1157      PT SHORT TERM GOAL #1   Title Pt will be independent with HEP    Time 4    Period Weeks    Status Achieved    Target Date 06/10/20      PT SHORT TERM GOAL #2   Title Pt will be able to perform 5x STS in <15 sec to demo improved functional strength    Baseline 19 sec; 15 sec 06/10/20    Time 4    Period Weeks    Status Achieved    Target Date 06/10/20      PT SHORT TERM GOAL #3   Title Pt will be able to decrease TUG score by at least 10 sec to demo MCID    Baseline 40 sec; 28 sec on 06/10/20    Time 4    Period Weeks    Status Achieved    Target Date 06/10/20      PT SHORT TERM GOAL #4   Title Pt will be fully independent with transfers    Baseline Requires min A at times    Time 4    Period Weeks    Status Achieved    Target Date 06/10/20             PT Long Term Goals - 07/11/20 1153      PT LONG TERM GOAL #1    Title Pt will be independent with maintaining mobility at home    Time 8    Period Weeks    Status On-going      PT LONG TERM GOAL #2   Title Pt will have improved 5 x STS to </=13 sec to demo decreased fall risk    Baseline 19 sec    Time 8    Period Weeks    Status On-going    Target Date 09/05/20      PT LONG TERM GOAL #3   Title Pt will have improved TUG score to </=20 sec    Baseline 40 sec    Time 8    Period Weeks    Status On-going    Target Date 09/05/20      PT LONG TERM GOAL #4   Title Pt will have improved Berg Score to at least 37/56 to demo [place] for decreased fall risk    Baseline 29/56    Time 8    Period Weeks    Status On-going    Target Date 09/05/20                 Plan - 08/12/20 1258    Clinical Impression Statement Patient had better tolerance to manual therapy today. She continues to have spasming inher hip, but it was harder to find. She toleratd ther-ex well. We worked on standing exercises. She did report pain in her hip but it  was tolerabel. She had decreased ability to weight bear on the right leg with standing exercises.    Personal Factors and Comorbidities Age;Fitness;Comorbidity 1;Comorbidity 2;Comorbidity 3+;Time since onset of injury/illness/exacerbation    Comorbidities CAD, hypertension, history of temporal arteritis, atrial fibrillation, GERD, chronic back pain with multiple prior surgeries, osteoarthritis involving multiple joints    Examination-Activity Limitations Bed Mobility;Bend;Locomotion Level;Transfers;Dressing;Stand;Squat    Stability/Clinical Decision Making Evolving/Moderate complexity    Clinical Decision Making Moderate    Rehab Potential Good    PT Frequency 3x / week    PT Duration 8 weeks    PT Treatment/Interventions Aquatic Therapy;ADLs/Self Care Home Management;Cryotherapy;Electrical Stimulation;Biofeedback;Moist Heat;DME Instruction;Gait training;Stair training;Functional mobility training;Therapeutic  activities;Therapeutic exercise;Balance training;Neuromuscular re-education;Manual techniques;Patient/family education;Orthotic Fit/Training;Dry needling;Taping;Vasopneumatic Device    PT Next Visit Plan [place]: Continue to work on hip and quad strengthening. Manual therapy & strengthening as needed for L ankle DF. Work on endurance in upright posture and gait.  20th VISIT NOTE. (end of POC on 4/11)    PT Home Exercise Plan ccess Code: [email or web address]  URL: [email or web address]  Date: 07/31/2020  Prepared by: Carolyne Littles    Exercises  Seated Knee Extension with Resistance - 1 x daily - 7 x weekly - 3 sets - 10 reps  Seated Isometric Hip Adduction with Ball - 1 x daily - 7 x weekly - 3 sets - 10 reps  Shoulder External Rotation and Scapular Retraction with Resistance - 1 x daily - 7 x weekly - 3 sets - 10 reps  Low Horizontal Abduction with Resistance - 1 x daily - 7 x weekly - 3 sets - 10 reps  Seated Heel Raise - 1 x daily - 7 x weekly - 3 sets - 10 reps  Seated March with Resistance - 1 x daily - 7 x weekly - 3 sets - 10 reps    Consulted and Agree with Plan of Care Patient;Family member/caregiver    Family Member Consulted Caregiver           Patient will benefit from skilled therapeutic intervention in order to improve the following deficits and impairments:  Abnormal gait,Decreased range of motion,Difficulty walking,Decreased endurance,Decreased activity tolerance,Pain,Decreased balance,Hypomobility,Improper body mechanics,Decreased mobility,Decreased strength,Postural dysfunction  Visit Diagnosis: Muscle weakness (generalized)  Abnormal posture  Pain in right hip  Difficulty in walking, not elsewhere classified  Other abnormalities of gait and mobility     Problem List Patient Active Problem List   Diagnosis Date Noted  . Stroke-like symptoms 07/14/2020  . Cough 05/24/2020  . Dyspnea 05/24/2020  . Optic neuritis 05/24/2020  . Osteopenia 05/24/2020  . Rash 05/24/2020   . Vitamin D deficiency 05/24/2020  . Palliative care by specialist   . Goals of care, counseling/discussion   . DNR (do not resuscitate)   . Weakness   . Advanced directives, counseling/discussion   . Pressure injury of skin 10/18/2019  . Closed fracture of right distal femur ([place]) 10/17/2019  . Femur fracture, right ([place]) 09/25/2019  . Chronic diastolic CHF (congestive heart failure) ([place]) 09/25/2019  . HCAP (healthcare-associated pneumonia) 06/19/2019  . Encephalopathy 06/19/2019  . Lumbar degenerative disc disease   . Paroxysmal A-fib ([place])   . Hypomagnesemia   . C. difficile colitis   . Diarrhea 05/29/2019  . CHF (congestive heart failure) ([place]) 05/29/2019  . Recurrent falls 04/30/2019  . Pericardial effusion without cardiac tamponade 03/29/2019  . Hypoalbuminemia 03/27/2019  . Cardiac tamponade   . Pericarditis 03/06/2019  . Fever   . Septic shock ([place])   .  Atypical chest pain   . Atrial fibrillation with rapid ventricular response ([place])   . Nausea 01/17/2019  . Acute diverticulitis 01/13/2019  . Chronic back pain 01/13/2019  . Tachycardia 01/13/2019  . Hypokalemia 01/13/2019  . Chronic low back pain 07/10/2018  . Influenza A 05/20/2017  . Severe sepsis ([place]) 05/20/2017  . Acute respiratory failure with hypoxia ([place]) 05/20/2017  . CKD (chronic kidney disease), stage III ([place]) 05/20/2017  . PAF (paroxysmal atrial fibrillation) ([place]) 05/20/2017  . HTN (hypertension) 05/20/2017  . Giant cell arteritis ([place]) 05/20/2017  . Coronary disease 05/20/2017  . Abnormal urinalysis 05/20/2017  . Osteoporosis 05/08/2016  . Herniated nucleus pulposus, L5-S1, right 11/04/2015  . Major depressive disorder, recurrent episode, moderate ([place]) 07/16/2013  . Abdominal pain, epigastric 06/22/2013  . Lumbar stenosis 04/25/2013  . Chronic insomnia 02/07/2013  . Elevated transaminase level 02/07/2013  . CVA (cerebral infarction) 10/29/2012  . Visual changes 10/28/2012  . Depression  10/28/2012  . Anxiety state 10/28/2012  . Anemia, unspecified 10/28/2012  . Nonspecific (abnormal) findings on radiological and other examination of gastrointestinal tract 01/25/2012  . Hyponatremia 01/24/2012  . Hematuria 01/24/2012  . Enteritis 12/24/2011  . Abdominal pain, other specified site 12/24/2011  . Leg pain, right 02/18/2011  . Rheumatoid arthritis ([place]) 02/04/2011  . Temporal arteritis ([place]) 09/01/2010  . Hypertension 09/01/2010  . Hyperlipidemia 09/01/2010  . History of atrial fibrillation 09/01/2010  . CAD (coronary artery disease) 09/01/2010  . GERD (gastroesophageal reflux disease) 09/01/2010  . Insomnia 09/01/2010    Carney Living  PT DPT  08/12/2020, 4:09 PM  [facility] [address] [place], [place], [address] Phone: [phone]   Fax:  [phone]  Name: SHIRLEEN MCFAUL MRN: [id number] Date of Birth: 03/09/1935

## 2020-08-14 ENCOUNTER — Encounter (HOSPITAL_BASED_OUTPATIENT_CLINIC_OR_DEPARTMENT_OTHER): Payer: Self-pay | Admitting: Physical Therapy

## 2020-08-14 ENCOUNTER — Ambulatory Visit (HOSPITAL_BASED_OUTPATIENT_CLINIC_OR_DEPARTMENT_OTHER): Payer: Medicare Other | Admitting: Physical Therapy

## 2020-08-14 ENCOUNTER — Other Ambulatory Visit: Payer: Self-pay

## 2020-08-14 DIAGNOSIS — M6281 Muscle weakness (generalized): Secondary | ICD-10-CM

## 2020-08-14 DIAGNOSIS — R262 Difficulty in walking, not elsewhere classified: Secondary | ICD-10-CM | POA: Diagnosis not present

## 2020-08-14 DIAGNOSIS — M25551 Pain in right hip: Secondary | ICD-10-CM | POA: Diagnosis not present

## 2020-08-14 DIAGNOSIS — R293 Abnormal posture: Secondary | ICD-10-CM

## 2020-08-14 DIAGNOSIS — R2689 Other abnormalities of gait and mobility: Secondary | ICD-10-CM | POA: Diagnosis not present

## 2020-08-14 MED ORDER — QUETIAPINE FUMARATE 25 MG PO TABS: ORAL_TABLET | ORAL | 5 refills | Status: DC

## 2020-08-14 MED ORDER — HYDROCODONE-ACETAMINOPHEN 10-325 MG PO TABS: ORAL_TABLET | ORAL | 0 refills | Status: DC

## 2020-08-14 NOTE — Addendum Note (Signed)
Addended by: Eulas Post on: 08/14/2020 06:42 AM   Modules accepted: Orders

## 2020-08-15 ENCOUNTER — Encounter (HOSPITAL_BASED_OUTPATIENT_CLINIC_OR_DEPARTMENT_OTHER): Payer: Self-pay | Admitting: Physical Therapy

## 2020-08-15 NOTE — Therapy (Signed)
[place], [place], [address] Phone: [phone]   Fax:  [phone]  Physical Therapy Treatment  Patient Details  Name: Betty Jackson MRN: [id number] Date of Birth: 09-09-1934 Referring Provider (PT): Altamese Jennings Lodge, MD   Encounter Date: 08/14/2020   PT End of Session - 08/15/20 0926    Visit Number 24    Number of Visits 39    Date for PT Re-Evaluation 09/05/20    Authorization Type UHC Medicare    PT Start Time 1100    PT Stop Time 1140    PT Time Calculation (min) 40 min    Activity Tolerance Patient tolerated treatment well    Behavior During Therapy [facility] for tasks assessed/performed           Past Medical History:  Diagnosis Date  . Acute respiratory failure with hypoxia ([place]) 05/20/2017  . Anemia, unspecified 10/28/2012  . Anxiety state, unspecified 10/28/2012  . CAD (coronary artery disease)    a. Stent RCA in [facility];  b. Cath approx 2009 - nonobs per pt report.  . Cataract    immature on the left eye  . Chronic insomnia 02/07/2013  . Chronic lower back pain    scoliosis  . CKD (chronic kidney disease) stage 3, GFR 30-59 ml/min (HCC)   . CVA (cerebral infarction) 10/29/2012  . DDD (degenerative disc disease)   . Diverticulosis   . GERD (gastroesophageal reflux disease) 09/01/2010  . Hemorrhoids   . Herniated nucleus pulposus, L5-S1, right 11/04/2015  . Hyperlipidemia    takes Lipitor daily  . Hypertension    takes Amlodipine,Losartan,Metoprolol,and HCTZ daily  . Incontinence of urine   . Insomnia    takes Restoril nightly  . Lumbar stenosis 04/25/2013  . Major depressive disorder, recurrent episode, moderate ([place]) 07/16/2013  . Osteoporosis   . PAF (paroxysmal atrial fibrillation) ([place]) 2011   a. lone epidode in 2011 according to notes.  . Rheumatoid arthritis ([place])   . Scoliosis   . Sepsis ([place]) 05/2019  . Temporal arteritis ([place]) 2011   a. followed @ [facility]; potential  flareup 10/28/2012/notes 10/28/2012  . Vocal cord dysfunction    "they don't operate properly" (10/28/2012)    Past Surgical History:  Procedure Laterality Date  . ABDOMINAL HYSTERECTOMY  ~ 1984   vaginally  . BACK SURGERY  7-65yrs ago   X Stop  . BLADDER SUSPENSION  2001  . BREAST BIOPSY Right   . CATARACT EXTRACTION W/ INTRAOCULAR LENS IMPLANT Right ~ 08/2012  . CHEST TUBE INSERTION Left 03/08/2019   Procedure: Chest Tube Insertion;  Surgeon: Ivin Poot, MD;  Location: [place];  Service: Thoracic;  Laterality: Left;  . COLONOSCOPY  01/26/2012   Procedure: COLONOSCOPY;  Surgeon: Ladene Artist, MD,FACG;  Location: [facility] ENDOSCOPY;  Service: Endoscopy;  Laterality: N/A;  note the EGD is possible  . CORONARY ANGIOPLASTY WITH STENT PLACEMENT  2006   X 1 stent  . EPIDURAL BLOCK INJECTION    . ESOPHAGOGASTRODUODENOSCOPY  01/26/2012   Procedure: ESOPHAGOGASTRODUODENOSCOPY (EGD);  Surgeon: Ladene Artist, MD,FACG;  Location: [facility] ENDOSCOPY;  Service: Endoscopy;  Laterality: N/A;  . FINE NEEDLE ASPIRATION Right 09/26/2019   Procedure: FINE NEEDLE ASPIRATION;  Surgeon: Altamese Whitehall, MD;  Location: [place];  Service: Orthopedics;  Laterality: Right;  . HEMIARTHROPLASTY HIP Right 2012  . IR EPIDUROGRAPHY  04/20/2019  . LAPAROSCOPIC CHOLECYSTECTOMY  2001  . LUMBAR FUSION  03/2013  . LUMBAR LAMINECTOMY/DECOMPRESSION MICRODISCECTOMY Right  11/04/2015   Procedure: Right Lumbar Five-Sacral One Microdiskectomy;  Surgeon: Kristeen Miss, MD;  Location: [place] NEURO ORS;  Service: Neurosurgery;  Laterality: Right;  Right L5-S1 Microdiskectomy  . moles removed that required stiches     one on leg and one on face  . ORIF FEMUR FRACTURE Right 09/26/2019   Procedure: OPEN REDUCTION INTERNAL FIXATION (ORIF) DISTAL FEMUR FRACTURE;  Surgeon: Altamese [place], MD;  Location: [place];  Service: Orthopedics;  Laterality: Right;  . ORIF FEMUR FRACTURE Right 10/17/2019   Procedure: OPEN REDUCTION INTERNAL FIXATION (ORIF) DISTAL FEMUR  FRACTURE;  Surgeon: Altamese Aredale, MD;  Location: [place];  Service: Orthopedics;  Laterality: Right;  . SUBXYPHOID PERICARDIAL WINDOW N/A 03/08/2019   Procedure: SUBXYPHOID PERICARDIAL WINDOW;  Surgeon: Ivin Poot, MD;  Location: [place];  Service: Thoracic;  Laterality: N/A;  . TEE WITHOUT CARDIOVERSION N/A 03/08/2019   Procedure: TRANSESOPHAGEAL ECHOCARDIOGRAM (TEE);  Surgeon: Prescott Gum, Collier Salina, MD;  Location: [place];  Service: Thoracic;  Laterality: N/A;  . TEMPORAL ARTERY BIOPSY / LIGATION Bilateral 2011  . TONSILLECTOMY AND ADENOIDECTOMY     at age 69  . TUBAL LIGATION  ~ 1982  . X-STOP IMPLANTATION  ~ 2010   "lower back" (10/28/2012)    There were no vitals filed for this visit.   Subjective Assessment - 08/14/20 1108    Subjective Patient reports her right hip is feeling better. She is having less pain. She is therwise doing OK today.    Pertinent History CAD, hypertension, history of temporal arteritis, atrial fibrillation, GERD, chronic back pain with multiple prior surgeries, osteoarthritis involving multiple joints    Limitations Standing    How long can you sit comfortably? N/a    How long can you stand comfortably? Not long    How long can you walk comfortably? 67' with RW (PLOF)    Patient Stated Goals Improve mobility and strength    Currently in Pain? --   faces pain scale 2/10   Pain Location Hip    Pain Orientation Right    Pain Descriptors / Indicators Aching    Pain Type Acute pain    Pain Onset 1 to 4 weeks ago    Pain Frequency Constant    Aggravating Factors  standing and walking    Pain Relieving Factors pain relievers    Effect of Pain on Daily Activities decreased ability to stand                             [facility] Adult PT Treatment/Exercise - 08/15/20 0001      Transfers   Comments sit to stand x5 with cuing for technique      Ambulation/Gait   Gait Comments ambualted 35; with cuing to stand up straight; tends to have flexed trunk  and flexed knees with ambulation      High Level Balance   High Level Balance Comments narrow base of support 3x20 sec hold had to sit between trials; heel to toe x20 with min UE support and min a for balance 3x10 with resrt breaks      Knee/Hip Exercises: Stretches   Other Knee/Hip Stretches LTR 3x20 sec hold      Knee/Hip Exercises: Aerobic   Nustep L5 x 5 min      Knee/Hip Exercises: Standing   Heel Raises Limitations 3x10    Other Standing Knee Exercises standing slow march 3x10      Knee/Hip Exercises:  Seated   Long Arc Quad Strengthening;Both;2 sets;10 reps    Other Seated Knee/Hip Exercises ball squeeze 3x10    Abd/Adduction Limitations bilateral hip abduction 3x10 yellow      Knee/Hip Exercises: Supine   Heel Slides AROM;Right;10 reps;2 sets      Manual Therapy   Joint Mobilization LAD grade 1 and II with occilations    Soft tissue mobilization to lower lumbar spine and upper gluteal in side lying                  PT Education - 08/15/20 0932    Person(s) Educated Patient    Methods Explanation;Demonstration;Verbal cues;Tactile cues            PT Short Term Goals - 06/10/20 1157      PT SHORT TERM GOAL #1   Title Pt will be independent with HEP    Time 4    Period Weeks    Status Achieved    Target Date 06/10/20      PT SHORT TERM GOAL #2   Title Pt will be able to perform 5x STS in <15 sec to demo improved functional strength    Baseline 19 sec; 15 sec 06/10/20    Time 4    Period Weeks    Status Achieved    Target Date 06/10/20      PT SHORT TERM GOAL #3   Title Pt will be able to decrease TUG score by at least 10 sec to demo MCID    Baseline 40 sec; 28 sec on 06/10/20    Time 4    Period Weeks    Status Achieved    Target Date 06/10/20      PT SHORT TERM GOAL #4   Title Pt will be fully independent with transfers    Baseline Requires min A at times    Time 4    Period Weeks    Status Achieved    Target Date 06/10/20              PT Long Term Goals - 07/11/20 1153      PT LONG TERM GOAL #1   Title Pt will be independent with maintaining mobility at home    Time 8    Period Weeks    Status On-going      PT LONG TERM GOAL #2   Title Pt will have improved 5 x STS to </=13 sec to demo decreased fall risk    Baseline 19 sec    Time 8    Period Weeks    Status On-going    Target Date 09/05/20      PT LONG TERM GOAL #3   Title Pt will have improved TUG score to </=20 sec    Baseline 40 sec    Time 8    Period Weeks    Status On-going    Target Date 09/05/20      PT LONG TERM GOAL #4   Title Pt will have improved Berg Score to at least 37/56 to demo [place] for decreased fall risk    Baseline 29/56    Time 8    Period Weeks    Status On-going    Target Date 09/05/20                 Plan - 08/15/20 0926    Clinical Impression Statement Patients hip pain is becoming more controlled. She was able to do standing balance exercises today without  increased hip pain. She increased her gait distance but continues to require cuing to stand straight and gaurding. She ambualted 35'. She tolerated ther-ex well. She required less verbal and tactile cuing. Therapy will continue to advance as tolerated.    Personal Factors and Comorbidities Age;Fitness;Comorbidity 1;Comorbidity 2;Comorbidity 3+;Time since onset of injury/illness/exacerbation    Comorbidities CAD, hypertension, history of temporal arteritis, atrial fibrillation, GERD, chronic back pain with multiple prior surgeries, osteoarthritis involving multiple joints    Examination-Activity Limitations Bed Mobility;Bend;Locomotion Level;Transfers;Dressing;Stand;Squat    Examination-Participation Restrictions Community Activity;Cleaning;Shop;Laundry    Stability/Clinical Decision Making Evolving/Moderate complexity    Clinical Decision Making Moderate    Rehab Potential Good    PT Frequency 3x / week    PT Treatment/Interventions Aquatic Therapy;ADLs/Self  Care Home Management;Cryotherapy;Electrical Stimulation;Biofeedback;Moist Heat;DME Instruction;Gait training;Stair training;Functional mobility training;Therapeutic activities;Therapeutic exercise;Balance training;Neuromuscular re-education;Manual techniques;Patient/family education;Orthotic Fit/Training;Dry needling;Taping;Vasopneumatic Device    PT Next Visit Plan [place]: Continue to work on hip and quad strengthening. Manual therapy & strengthening as needed for L ankle DF. Work on endurance in upright posture and gait.  20th VISIT NOTE. (end of POC on 4/11)    PT Home Exercise Plan ccess Code: [email or web address]  URL: [email or web address]  Date: 07/31/2020  Prepared by: Carolyne Littles    Exercises  Seated Knee Extension with Resistance - 1 x daily - 7 x weekly - 3 sets - 10 reps  Seated Isometric Hip Adduction with Ball - 1 x daily - 7 x weekly - 3 sets - 10 reps  Shoulder External Rotation and Scapular Retraction with Resistance - 1 x daily - 7 x weekly - 3 sets - 10 reps  Low Horizontal Abduction with Resistance - 1 x daily - 7 x weekly - 3 sets - 10 reps  Seated Heel Raise - 1 x daily - 7 x weekly - 3 sets - 10 reps  Seated March with Resistance - 1 x daily - 7 x weekly - 3 sets - 10 reps    Consulted and Agree with Plan of Care Patient;Family member/caregiver    Family Member Consulted Caregiver           Patient will benefit from skilled therapeutic intervention in order to improve the following deficits and impairments:  Abnormal gait,Decreased range of motion,Difficulty walking,Decreased endurance,Decreased activity tolerance,Pain,Decreased balance,Hypomobility,Improper body mechanics,Decreased mobility,Decreased strength,Postural dysfunction  Visit Diagnosis: Muscle weakness (generalized)  Abnormal posture  Pain in right hip  Difficulty in walking, not elsewhere classified  Other abnormalities of gait and mobility     Problem List Patient Active Problem List   Diagnosis Date  Noted  . Stroke-like symptoms 07/14/2020  . Cough 05/24/2020  . Dyspnea 05/24/2020  . Optic neuritis 05/24/2020  . Osteopenia 05/24/2020  . Rash 05/24/2020  . Vitamin D deficiency 05/24/2020  . Palliative care by specialist   . Goals of care, counseling/discussion   . DNR (do not resuscitate)   . Weakness   . Advanced directives, counseling/discussion   . Pressure injury of skin 10/18/2019  . Closed fracture of right distal femur ([place]) 10/17/2019  . Femur fracture, right ([place]) 09/25/2019  . Chronic diastolic CHF (congestive heart failure) ([place]) 09/25/2019  . HCAP (healthcare-associated pneumonia) 06/19/2019  . Encephalopathy 06/19/2019  . Lumbar degenerative disc disease   . Paroxysmal A-fib ([place])   . Hypomagnesemia   . C. difficile colitis   . Diarrhea 05/29/2019  . CHF (congestive heart failure) ([place]) 05/29/2019  . Recurrent falls 04/30/2019  . Pericardial effusion without cardiac tamponade 03/29/2019  .  Hypoalbuminemia 03/27/2019  . Cardiac tamponade   . Pericarditis 03/06/2019  . Fever   . Septic shock ([place])   . Atypical chest pain   . Atrial fibrillation with rapid ventricular response ([place])   . Nausea 01/17/2019  . Acute diverticulitis 01/13/2019  . Chronic back pain 01/13/2019  . Tachycardia 01/13/2019  . Hypokalemia 01/13/2019  . Chronic low back pain 07/10/2018  . Influenza A 05/20/2017  . Severe sepsis ([place]) 05/20/2017  . Acute respiratory failure with hypoxia ([place]) 05/20/2017  . CKD (chronic kidney disease), stage III ([place]) 05/20/2017  . PAF (paroxysmal atrial fibrillation) ([place]) 05/20/2017  . HTN (hypertension) 05/20/2017  . Giant cell arteritis ([place]) 05/20/2017  . Coronary disease 05/20/2017  . Abnormal urinalysis 05/20/2017  . Osteoporosis 05/08/2016  . Herniated nucleus pulposus, L5-S1, right 11/04/2015  . Major depressive disorder, recurrent episode, moderate ([place]) 07/16/2013  . Abdominal pain, epigastric 06/22/2013  . Lumbar stenosis 04/25/2013   . Chronic insomnia 02/07/2013  . Elevated transaminase level 02/07/2013  . CVA (cerebral infarction) 10/29/2012  . Visual changes 10/28/2012  . Depression 10/28/2012  . Anxiety state 10/28/2012  . Anemia, unspecified 10/28/2012  . Nonspecific (abnormal) findings on radiological and other examination of gastrointestinal tract 01/25/2012  . Hyponatremia 01/24/2012  . Hematuria 01/24/2012  . Enteritis 12/24/2011  . Abdominal pain, other specified site 12/24/2011  . Leg pain, right 02/18/2011  . Rheumatoid arthritis ([place]) 02/04/2011  . Temporal arteritis ([place]) 09/01/2010  . Hypertension 09/01/2010  . Hyperlipidemia 09/01/2010  . History of atrial fibrillation 09/01/2010  . CAD (coronary artery disease) 09/01/2010  . GERD (gastroesophageal reflux disease) 09/01/2010  . Insomnia 09/01/2010    Carney Living  PT DPT  08/15/2020, 10:26 AM  [facility] [place], [place], [address] Phone: [phone]   Fax:  [phone]  Name: Betty Jackson MRN: [id number] Date of Birth: 1935/02/06

## 2020-08-19 ENCOUNTER — Ambulatory Visit (HOSPITAL_BASED_OUTPATIENT_CLINIC_OR_DEPARTMENT_OTHER): Payer: Medicare Other | Admitting: Physical Therapy

## 2020-08-20 ENCOUNTER — Telehealth: Payer: Self-pay | Admitting: *Deleted

## 2020-08-20 NOTE — Telephone Encounter (Signed)
Patient with diagnosis of afib on Eliquis for anticoagulation.    Procedure: 4 dental extractions Date of procedure: TBD  CHA2DS2-VASc Score = 6  This indicates a 9.7% annual risk of stroke. The patient's score is based upon: CHF History: Yes HTN History: Yes Diabetes History: No Stroke History: No Vascular Disease History: Yes Age Score: 2 Gender Score: 1      Patient has CVA listed on her problem list, however itwas felt the patient had a flare of her temporal arteritis and a punctate stroke seen on MRI may be a manifestation of the temporal arteritis.  CrCl 43 ml/min Platelet count 208  Patient does NOT require pre-op antibiotics for dental procedure.  Per office protocol, patient can hold Eliquis for 1 day prior to procedure.   She should resume 24 hours after procedure.

## 2020-08-20 NOTE — Telephone Encounter (Signed)
   [facility] Pre-operative Risk Assessment    Patient Name: Betty Jackson  DOB: [birth year]  MRN: [id number]   HEARTCARE STAFF: - Please ensure there is not already an duplicate clearance open for this procedure. - Under Visit Info/Reason for Call, type in Other and utilize the format Clearance MM/DD/YY or Clearance TBD. Do not use dashes or single digits. - If request is for dental extraction, please clarify the # of teeth to be extracted.  Request for surgical clearance:  1. What type of surgery is being performed?  4 EXTRACTIONS   2. When is this surgery scheduled?  TBD   3. What type of clearance is required (medical clearance vs. Pharmacy clearance to hold med vs. Both)?  BOTH  4. Are there any medications that need to be held prior to surgery and how long? ELIQUIS   5. Practice name and name of physician performing surgery?  [facility] / DR. REHM   6. What is the office phone number? [phone]   7.   What is the office fax number?  [phone]  8.   Anesthesia type (None, local, MAC, general) ?    Betty Jackson 08/20/2020, 1:07 PM  _________________________________________________________________   (provider comments below)

## 2020-08-20 NOTE — Telephone Encounter (Signed)
Pt has been scheduled to see Terie Purser, NP, 09/16/2020 2:15 and is aware to arrive at 2:00 for registration.  Will route back to the requesting surgeon's office to make them aware.

## 2020-08-20 NOTE — Telephone Encounter (Signed)
   Name: Betty Jackson  DOB: Aug 16, 1934  MRN: [id number]  Primary Cardiologist: [name], MD  Chart reviewed as part of pre-operative protocol coverage. The patient will require a follow-up visit in order to better assess preoperative cardiovascular risk.  Although dental procedures are low risk, the request is for multiple extractions and to hold Eliquis.  The patient has a complex CV history.  She has not been seen by Cardiology in > 1 year.  Pre-op covering staff: - Please schedule appointment and call patient to inform them. If patient already had an upcoming appointment within acceptable timeframe, please add "pre-op clearance" to the appointment notes so provider is aware. - Please contact requesting surgeon's office via preferred method (i.e, phone, fax) to inform them of need for appointment prior to surgery. - Will also route to pharmacy pool for recommendations regarding anticoagulation around surgery.   Richardson Dopp, PA-C  08/20/2020, 1:29 PM

## 2020-08-21 ENCOUNTER — Encounter (HOSPITAL_BASED_OUTPATIENT_CLINIC_OR_DEPARTMENT_OTHER): Payer: Self-pay | Admitting: Physical Therapy

## 2020-08-21 ENCOUNTER — Other Ambulatory Visit: Payer: Self-pay

## 2020-08-21 ENCOUNTER — Ambulatory Visit (HOSPITAL_BASED_OUTPATIENT_CLINIC_OR_DEPARTMENT_OTHER): Payer: Medicare Other | Admitting: Physical Therapy

## 2020-08-21 DIAGNOSIS — R2689 Other abnormalities of gait and mobility: Secondary | ICD-10-CM

## 2020-08-21 DIAGNOSIS — R293 Abnormal posture: Secondary | ICD-10-CM | POA: Diagnosis not present

## 2020-08-21 DIAGNOSIS — M6281 Muscle weakness (generalized): Secondary | ICD-10-CM | POA: Diagnosis not present

## 2020-08-21 DIAGNOSIS — M25551 Pain in right hip: Secondary | ICD-10-CM | POA: Diagnosis not present

## 2020-08-21 DIAGNOSIS — R262 Difficulty in walking, not elsewhere classified: Secondary | ICD-10-CM | POA: Diagnosis not present

## 2020-08-22 ENCOUNTER — Encounter (HOSPITAL_BASED_OUTPATIENT_CLINIC_OR_DEPARTMENT_OTHER): Payer: Self-pay | Admitting: Physical Therapy

## 2020-08-22 NOTE — Therapy (Addendum)
[place], [place], [address] Phone: [phone]   Fax:  [phone]  Physical Therapy Treatment/Discharge   Patient Details  Name: Betty Jackson MRN: [id number] Date of Birth: [birth year] Referring Provider (PT): Altamese Ogle, MD   Encounter Date: 08/21/2020   PT End of Session - 08/21/20 1138     Visit Number 25    Number of Visits 74    Date for PT Re-Evaluation 09/05/20    Authorization Type UHC Medicare    PT Start Time 1100    PT Stop Time 1142    PT Time Calculation (min) 42 min    Activity Tolerance Patient tolerated treatment well    Behavior During Therapy [facility] for tasks assessed/performed             Past Medical History:  Diagnosis Date   Acute respiratory failure with hypoxia ([place]) 05/20/2017   Anemia, unspecified 10/28/2012   Anxiety state, unspecified 10/28/2012   CAD (coronary artery disease)    a. Stent RCA in [facility];  b. Cath approx 2009 - nonobs per pt report.   Cataract    immature on the left eye   Chronic insomnia 02/07/2013   Chronic lower back pain    scoliosis   CKD (chronic kidney disease) stage 3, GFR 30-59 ml/min (HCC)    CVA (cerebral infarction) 10/29/2012   DDD (degenerative disc disease)    Diverticulosis    GERD (gastroesophageal reflux disease) 09/01/2010   Hemorrhoids    Herniated nucleus pulposus, L5-S1, right 11/04/2015   Hyperlipidemia    takes Lipitor daily   Hypertension    takes Amlodipine,Losartan,Metoprolol,and HCTZ daily   Incontinence of urine    Insomnia    takes Restoril nightly   Lumbar stenosis 04/25/2013   Major depressive disorder, recurrent episode, moderate ([place]) 07/16/2013   Osteoporosis    PAF (paroxysmal atrial fibrillation) ([place]) 2011   a. lone epidode in 2011 according to notes.   Rheumatoid arthritis ([place])    Scoliosis    Sepsis ([place]) 05/2019   Temporal arteritis ([place]) 2011   a. followed @ [facility]; potential flareup  10/28/2012/notes 10/28/2012   Vocal cord dysfunction    "they don't operate properly" (10/28/2012)    Past Surgical History:  Procedure Laterality Date   ABDOMINAL HYSTERECTOMY  ~ 1984   vaginally   BACK SURGERY  7-106yr ago   X Stop   BLADDER SUSPENSION  2001   BREAST BIOPSY Right    CATARACT EXTRACTION W/ INTRAOCULAR LENS IMPLANT Right ~ 08/2012   CHEST TUBE INSERTION Left 03/08/2019   Procedure: Chest Tube Insertion;  Surgeon: VIvin Poot MD;  Location: [facility]  Service: Thoracic;  Laterality: Left;   COLONOSCOPY  01/26/2012   Procedure: COLONOSCOPY;  Surgeon: MLadene Artist MD,FACG;  [facility]  Service: Endoscopy;  Laterality: N/A;  note the EGD is possible   CORONARY ANGIOPLASTY WITH STENT PLACEMENT  2006   X 1 stent   EPIDURAL BLOCK INJECTION     ESOPHAGOGASTRODUODENOSCOPY  01/26/2012   Procedure: ESOPHAGOGASTRODUODENOSCOPY (EGD);  Surgeon: MLadene Artist MD,FACG;  [facility]  Service: Endoscopy;  Laterality: N/A;   FINE NEEDLE ASPIRATION Right 09/26/2019   Procedure: FINE NEEDLE ASPIRATION;  Surgeon: HAltamese Burton MD;  Location: [facility]  Service: Orthopedics;  Laterality: Right;   HEMIARTHROPLASTY HIP Right 2012   IR EPIDUROGRAPHY  04/20/2019   LAPAROSCOPIC CHOLECYSTECTOMY  2001   LUMBAR FUSION  03/2013  LUMBAR LAMINECTOMY/DECOMPRESSION MICRODISCECTOMY Right 11/04/2015   Procedure: Right Lumbar Five-Sacral One Microdiskectomy;  Surgeon: Kristeen Miss, MD;  Location: [facility];  Service: Neurosurgery;  Laterality: Right;  Right L5-S1 Microdiskectomy   moles removed that required stiches     one on leg and one on face   ORIF FEMUR FRACTURE Right 09/26/2019   Procedure: OPEN REDUCTION INTERNAL FIXATION (ORIF) DISTAL FEMUR FRACTURE;  Surgeon: Altamese Lane, MD;  Location: [facility];  Service: Orthopedics;  Laterality: Right;   ORIF FEMUR FRACTURE Right 10/17/2019   Procedure: OPEN REDUCTION INTERNAL FIXATION (ORIF) DISTAL FEMUR FRACTURE;  Surgeon: Altamese Hissop, MD;  Location: [place];  Service: Orthopedics;  Laterality: Right;   SUBXYPHOID PERICARDIAL WINDOW N/A 03/08/2019   Procedure: SUBXYPHOID PERICARDIAL WINDOW;  Surgeon: Ivin Poot, MD;  Location: [facility];  Service: Thoracic;  Laterality: N/A;   TEE WITHOUT CARDIOVERSION N/A 03/08/2019   Procedure: TRANSESOPHAGEAL ECHOCARDIOGRAM (TEE);  Surgeon: Prescott Gum, Collier Salina, MD;  Location: [place];  Service: Thoracic;  Laterality: N/A;   TEMPORAL ARTERY BIOPSY / LIGATION Bilateral 2011   TONSILLECTOMY AND ADENOIDECTOMY     at age 29   [place]  ~ [place]  ~ 2010   "lower back" (10/28/2012)    There were no vitals filed for this visit.   Subjective Assessment - 08/21/20 1136     Subjective The patient reports she has been a little more syncopal today. Therapy assessed her B/P and it as 148/78. Her HR was 58. Her hip is doing well today. A few days ago her hip was severely painful.    Pertinent History CAD, hypertension, history of temporal arteritis, atrial fibrillation, GERD, chronic back pain with multiple prior surgeries, osteoarthritis involving multiple joints    How long can you sit comfortably? N/a    How long can you stand comfortably? Not long    How long can you walk comfortably? 49' with RW (PLOF)    Patient Stated Goals Improve mobility and strength    Currently in Pain? No/denies                               [facility] Adult PT Treatment/Exercise - 08/22/20 0001       Transfers   Comments sit to stand x5 with cuing for technique      Ambulation/Gait   Gait Comments ambualted 30; with cuing to stand up straight; tends to have flexed trunk and flexed knees with ambulation      High Level Balance   High Level Balance Comments narrow base of support 3x20 sec hold had to sit between trials; heel to toe x20 with min UE support and min a for balance 3x10 with resrt breaks      Knee/Hip Exercises: Stretches   Other Knee/Hip Stretches LTR  3x20 sec hold      Knee/Hip Exercises: Aerobic   Nustep L5 x 5 min      Knee/Hip Exercises: Standing   Heel Raises Limitations 3x10    Other Standing Knee Exercises standing slow march 3x10      Knee/Hip Exercises: Seated   Long Arc Quad Strengthening;Both;2 sets;10 reps    Other Seated Knee/Hip Exercises ball squeeze 3x10    Abd/Adduction Limitations bilateral hip abduction 3x10 yellow      Knee/Hip Exercises: Supine   Heel Slides AROM;Right;10 reps;2 sets    Other Supine Knee/Hip Exercises ball roll fwd 5 sec  hold at end range x10;      Manual Therapy   Joint Mobilization LAD grade 1 and II with occilations    Soft tissue mobilization to lower lumbar spine and upper gluteal in side lying                    PT Education - 08/21/20 1138     Education Details reviewed HEP and technique with HEP    Person(s) Educated Patient    Methods Explanation;Demonstration;Tactile cues;Verbal cues    Comprehension Verbalized understanding;Returned demonstration;Verbal cues required;Tactile cues required              PT Short Term Goals - 06/10/20 1157       PT SHORT TERM GOAL #1   Title Pt will be independent with HEP    Time 4    Period Weeks    Status Achieved    Target Date 06/10/20      PT SHORT TERM GOAL #2   Title Pt will be able to perform 5x STS in <15 sec to demo improved functional strength    Baseline 19 sec; 15 sec 06/10/20    Time 4    Period Weeks    Status Achieved    Target Date 06/10/20      PT SHORT TERM GOAL #3   Title Pt will be able to decrease TUG score by at least 10 sec to demo MCID    Baseline 40 sec; 28 sec on 06/10/20    Time 4    Period Weeks    Status Achieved    Target Date 06/10/20      PT SHORT TERM GOAL #4   Title Pt will be fully independent with transfers    Baseline Requires min A at times    Time 4    Period Weeks    Status Achieved    Target Date 06/10/20               PT Long Term Goals - 07/11/20 1153        PT LONG TERM GOAL #1   Title Pt will be independent with maintaining mobility at home    Time 8    Period Weeks    Status On-going      PT LONG TERM GOAL #2   Title Pt will have improved 5 x STS to </=13 sec to demo decreased fall risk    Baseline 19 sec    Time 8    Period Weeks    Status On-going    Target Date 09/05/20      PT LONG TERM GOAL #3   Title Pt will have improved TUG score to </=20 sec    Baseline 40 sec    Time 8    Period Weeks    Status On-going    Target Date 09/05/20      PT LONG TERM GOAL #4   Title Pt will have improved Berg Score to at least 37/56 to demo [place] for decreased fall risk    Baseline 29/56    Time 8    Period Weeks    Status On-going    Target Date 09/05/20                   Plan - 08/21/20 1140     Clinical Impression Statement Patient required increased assist for standing balance exercises compared to last visit. She reported feeling a little more syncopal but all vitals  were WNL. She continues to have soreness around her SI joint but overall it is improving. She is having times of severe pain but for the most part the pain is improving. Therapy will continue to progress standing exercises as tolerated.    Personal Factors and Comorbidities Age;Fitness;Comorbidity 1;Comorbidity 2;Comorbidity 3+;Time since onset of injury/illness/exacerbation    Comorbidities CAD, hypertension, history of temporal arteritis, atrial fibrillation, GERD, chronic back pain with multiple prior surgeries, osteoarthritis involving multiple joints    Rehab Potential Good    PT Frequency 3x / week    PT Duration 8 weeks    PT Treatment/Interventions Aquatic Therapy;ADLs/Self Care Home Management;Cryotherapy;Electrical Stimulation;Biofeedback;Moist Heat;DME Instruction;Gait training;Stair training;Functional mobility training;Therapeutic activities;Therapeutic exercise;Balance training;Neuromuscular re-education;Manual techniques;Patient/family  education;Orthotic Fit/Training;Dry needling;Taping;Vasopneumatic Device    PT Next Visit Plan [place]: Continue to work on hip and quad strengthening. Manual therapy & strengthening as needed for L ankle DF. Work on endurance in upright posture and gait.  20th VISIT NOTE. (end of POC on 4/11)    PT Home Exercise Plan ccess Code: [email or web address]  URL: [email or web address]  Date: 07/31/2020  Prepared by: Carolyne Littles    Exercises  Seated Knee Extension with Resistance - 1 x daily - 7 x weekly - 3 sets - 10 reps  Seated Isometric Hip Adduction with Ball - 1 x daily - 7 x weekly - 3 sets - 10 reps  Shoulder External Rotation and Scapular Retraction with Resistance - 1 x daily - 7 x weekly - 3 sets - 10 reps  Low Horizontal Abduction with Resistance - 1 x daily - 7 x weekly - 3 sets - 10 reps  Seated Heel Raise - 1 x daily - 7 x weekly - 3 sets - 10 reps  Seated March with Resistance - 1 x daily - 7 x weekly - 3 sets - 10 reps    Consulted and Agree with Plan of Care Patient;Family member/caregiver    Family Member Consulted Caregiver             Patient will benefit from skilled therapeutic intervention in order to improve the following deficits and impairments:  Abnormal gait,Decreased range of motion,Difficulty walking,Decreased endurance,Decreased activity tolerance,Pain,Decreased balance,Hypomobility,Improper body mechanics,Decreased mobility,Decreased strength,Postural dysfunction  Visit Diagnosis: Muscle weakness (generalized)  Abnormal posture  Pain in right hip  Difficulty in walking, not elsewhere classified  Other abnormalities of gait and mobility  PHYSICAL THERAPY DISCHARGE SUMMARY  Visits from Start of Care: 25  Current functional level related to goals / functional outcomes: Patient discharged 2nd to admission into SNF    Remaining deficits:    Education / Equipment: HEP   Patient agrees to discharge. Patient goals were not met. Patient is being discharged due  to a change in medical status.    Problem List Patient Active Problem List   Diagnosis Date Noted   Stroke-like symptoms 07/14/2020   Cough 05/24/2020   Dyspnea 05/24/2020   Optic neuritis 05/24/2020   Osteopenia 05/24/2020   Rash 05/24/2020   Vitamin D deficiency 05/24/2020   Palliative care by specialist    Goals of care, counseling/discussion    DNR (do not resuscitate)    Weakness    Advanced directives, counseling/discussion    Pressure injury of skin 10/18/2019   Closed fracture of right distal femur ([place]) 10/17/2019   Femur fracture, right ([place]) 09/25/2019   Chronic diastolic CHF (congestive heart failure) ([place]) 09/25/2019   HCAP (healthcare-associated pneumonia) 06/19/2019   Encephalopathy 06/19/2019   Lumbar degenerative disc disease  Paroxysmal A-fib (HCC)    Hypomagnesemia    C. difficile colitis    Diarrhea 05/29/2019   CHF (congestive heart failure) ([place]) 05/29/2019   Recurrent falls 04/30/2019   Pericardial effusion without cardiac tamponade 03/29/2019   Hypoalbuminemia 03/27/2019   Cardiac tamponade    Pericarditis 03/06/2019   Fever    Septic shock ([place])    Atypical chest pain    Atrial fibrillation with rapid ventricular response (HCC)    Nausea 01/17/2019   Acute diverticulitis 01/13/2019   Chronic back pain 01/13/2019   Tachycardia 01/13/2019   Hypokalemia 01/13/2019   Chronic low back pain 07/10/2018   Influenza A 05/20/2017   Severe sepsis ([place]) 05/20/2017   Acute respiratory failure with hypoxia ([place]) 05/20/2017   CKD (chronic kidney disease), stage III ([place]) 05/20/2017   PAF (paroxysmal atrial fibrillation) ([place]) 05/20/2017   HTN (hypertension) 05/20/2017   Giant cell arteritis ([place]) 05/20/2017   Coronary disease 05/20/2017   Abnormal urinalysis 05/20/2017   Osteoporosis 05/08/2016   Herniated nucleus pulposus, L5-S1, right 11/04/2015   Major depressive disorder, recurrent episode, moderate ([place]) 07/16/2013   Abdominal pain, epigastric  06/22/2013   Lumbar stenosis 04/25/2013   Chronic insomnia 02/07/2013   Elevated transaminase level 02/07/2013   CVA (cerebral infarction) 10/29/2012   Visual changes 10/28/2012   Depression 10/28/2012   Anxiety state 10/28/2012   Anemia, unspecified 10/28/2012   Nonspecific (abnormal) findings on radiological and other examination of gastrointestinal tract 01/25/2012   Hyponatremia 01/24/2012   Hematuria 01/24/2012   Enteritis 12/24/2011   Abdominal pain, other specified site 12/24/2011   Leg pain, right 02/18/2011   Rheumatoid arthritis ([place]) 02/04/2011   Temporal arteritis ([place]) 09/01/2010   Hypertension 09/01/2010   Hyperlipidemia 09/01/2010   History of atrial fibrillation 09/01/2010   CAD (coronary artery disease) 09/01/2010   GERD (gastroesophageal reflux disease) 09/01/2010   Insomnia 09/01/2010    Carney Living  PT DPT  08/22/2020, 11:47 AM  [facility] [address] [place], [place], [address] Phone: [phone]   Fax:  [phone]  Name: SHARAN MCENANEY MRN: [id number] Date of Birth: 05-Jun-1934

## 2020-08-25 ENCOUNTER — Other Ambulatory Visit: Payer: Self-pay | Admitting: Cardiovascular Disease

## 2020-08-25 ENCOUNTER — Other Ambulatory Visit: Payer: Self-pay | Admitting: Family Medicine

## 2020-08-27 ENCOUNTER — Encounter: Payer: Self-pay | Admitting: Family Medicine

## 2020-08-27 ENCOUNTER — Telehealth: Payer: Self-pay | Admitting: Family Medicine

## 2020-08-27 NOTE — Telephone Encounter (Signed)
Please advise. Spoke with the patients daughter and she stated We will have to redo the [facility] form to include this. Medication directions can not be PRN but have to be specific dose, route and frequency.

## 2020-08-27 NOTE — Telephone Encounter (Signed)
ATC, unable to leave a message.  

## 2020-08-27 NOTE — Telephone Encounter (Signed)
Spoke with the patients daughter, please see mychart message.

## 2020-08-27 NOTE — Telephone Encounter (Signed)
Patient daughter is calling and stated that patient is moving into a nursing facility and needed an updated list of current medications to be faxed to [phone]. Also daughter is requesting a call back before sending med list, please advise. CB is [phone]

## 2020-08-28 NOTE — Telephone Encounter (Signed)
Daughter is calling in stating that she need to add on to the [facility] form make sure that the pt has chronic pain (due to the medication) and add the medication Hydrocodone-acetaminophen (NORCO) 10-325 MG every 8hr PRN  Quetiapine (SEROQUEL) 4 MG nightly.  Daughter would like to make sure that it is on the new FL-2.  Daughter would like to have a call when the form is sent over.

## 2020-08-28 NOTE — Telephone Encounter (Signed)
Blank form has been placed in your red folder.

## 2020-08-28 NOTE — Telephone Encounter (Signed)
Pt daughter called to check the status of the below msg and is aware that someone will call her when the form is ready.  Daughter stated that she will be leaving going out of town today and want to see if they can get the form ready for to pick up today and she is aware that we ask for 3-5 business days for forms and it is on the providers desk to complete.

## 2020-08-30 DIAGNOSIS — M069 Rheumatoid arthritis, unspecified: Secondary | ICD-10-CM | POA: Diagnosis not present

## 2020-08-30 DIAGNOSIS — I5030 Unspecified diastolic (congestive) heart failure: Secondary | ICD-10-CM | POA: Diagnosis not present

## 2020-08-30 DIAGNOSIS — I2510 Atherosclerotic heart disease of native coronary artery without angina pectoris: Secondary | ICD-10-CM | POA: Diagnosis not present

## 2020-08-30 DIAGNOSIS — N183 Chronic kidney disease, stage 3 unspecified: Secondary | ICD-10-CM | POA: Diagnosis not present

## 2020-08-30 DIAGNOSIS — R5381 Other malaise: Secondary | ICD-10-CM | POA: Diagnosis not present

## 2020-08-30 DIAGNOSIS — D649 Anemia, unspecified: Secondary | ICD-10-CM | POA: Diagnosis not present

## 2020-08-30 DIAGNOSIS — I4891 Unspecified atrial fibrillation: Secondary | ICD-10-CM | POA: Diagnosis not present

## 2020-08-30 DIAGNOSIS — I639 Cerebral infarction, unspecified: Secondary | ICD-10-CM | POA: Diagnosis not present

## 2020-08-30 DIAGNOSIS — M316 Other giant cell arteritis: Secondary | ICD-10-CM | POA: Diagnosis not present

## 2020-08-30 DIAGNOSIS — I10 Essential (primary) hypertension: Secondary | ICD-10-CM | POA: Diagnosis not present

## 2020-08-30 DIAGNOSIS — M5136 Other intervertebral disc degeneration, lumbar region: Secondary | ICD-10-CM | POA: Diagnosis not present

## 2020-09-02 ENCOUNTER — Ambulatory Visit (HOSPITAL_BASED_OUTPATIENT_CLINIC_OR_DEPARTMENT_OTHER): Payer: Medicare Other | Admitting: Physical Therapy

## 2020-09-02 DIAGNOSIS — R2689 Other abnormalities of gait and mobility: Secondary | ICD-10-CM | POA: Diagnosis not present

## 2020-09-02 DIAGNOSIS — I5032 Chronic diastolic (congestive) heart failure: Secondary | ICD-10-CM | POA: Diagnosis not present

## 2020-09-02 DIAGNOSIS — Z96641 Presence of right artificial hip joint: Secondary | ICD-10-CM | POA: Diagnosis not present

## 2020-09-02 DIAGNOSIS — M6281 Muscle weakness (generalized): Secondary | ICD-10-CM | POA: Diagnosis not present

## 2020-09-02 DIAGNOSIS — M545 Low back pain, unspecified: Secondary | ICD-10-CM | POA: Diagnosis not present

## 2020-09-02 DIAGNOSIS — M5136 Other intervertebral disc degeneration, lumbar region: Secondary | ICD-10-CM | POA: Diagnosis not present

## 2020-09-02 DIAGNOSIS — Z741 Need for assistance with personal care: Secondary | ICD-10-CM | POA: Diagnosis not present

## 2020-09-02 DIAGNOSIS — I480 Paroxysmal atrial fibrillation: Secondary | ICD-10-CM | POA: Diagnosis not present

## 2020-09-02 DIAGNOSIS — M069 Rheumatoid arthritis, unspecified: Secondary | ICD-10-CM | POA: Diagnosis not present

## 2020-09-02 DIAGNOSIS — Z8673 Personal history of transient ischemic attack (TIA), and cerebral infarction without residual deficits: Secondary | ICD-10-CM | POA: Diagnosis not present

## 2020-09-02 DIAGNOSIS — R488 Other symbolic dysfunctions: Secondary | ICD-10-CM | POA: Diagnosis not present

## 2020-09-03 DIAGNOSIS — I10 Essential (primary) hypertension: Secondary | ICD-10-CM | POA: Diagnosis not present

## 2020-09-03 DIAGNOSIS — Z8673 Personal history of transient ischemic attack (TIA), and cerebral infarction without residual deficits: Secondary | ICD-10-CM | POA: Diagnosis not present

## 2020-09-03 DIAGNOSIS — M545 Low back pain, unspecified: Secondary | ICD-10-CM | POA: Diagnosis not present

## 2020-09-03 DIAGNOSIS — I480 Paroxysmal atrial fibrillation: Secondary | ICD-10-CM | POA: Diagnosis not present

## 2020-09-03 DIAGNOSIS — Z96641 Presence of right artificial hip joint: Secondary | ICD-10-CM | POA: Diagnosis not present

## 2020-09-03 DIAGNOSIS — Z741 Need for assistance with personal care: Secondary | ICD-10-CM | POA: Diagnosis not present

## 2020-09-03 DIAGNOSIS — E119 Type 2 diabetes mellitus without complications: Secondary | ICD-10-CM | POA: Diagnosis not present

## 2020-09-03 DIAGNOSIS — R488 Other symbolic dysfunctions: Secondary | ICD-10-CM | POA: Diagnosis not present

## 2020-09-03 DIAGNOSIS — M069 Rheumatoid arthritis, unspecified: Secondary | ICD-10-CM | POA: Diagnosis not present

## 2020-09-03 DIAGNOSIS — M6281 Muscle weakness (generalized): Secondary | ICD-10-CM | POA: Diagnosis not present

## 2020-09-03 DIAGNOSIS — I5032 Chronic diastolic (congestive) heart failure: Secondary | ICD-10-CM | POA: Diagnosis not present

## 2020-09-03 DIAGNOSIS — R2689 Other abnormalities of gait and mobility: Secondary | ICD-10-CM | POA: Diagnosis not present

## 2020-09-04 ENCOUNTER — Ambulatory Visit (HOSPITAL_BASED_OUTPATIENT_CLINIC_OR_DEPARTMENT_OTHER): Payer: Medicare Other | Admitting: Physical Therapy

## 2020-09-04 DIAGNOSIS — Z8673 Personal history of transient ischemic attack (TIA), and cerebral infarction without residual deficits: Secondary | ICD-10-CM | POA: Diagnosis not present

## 2020-09-04 DIAGNOSIS — I5032 Chronic diastolic (congestive) heart failure: Secondary | ICD-10-CM | POA: Diagnosis not present

## 2020-09-04 DIAGNOSIS — M069 Rheumatoid arthritis, unspecified: Secondary | ICD-10-CM | POA: Diagnosis not present

## 2020-09-04 DIAGNOSIS — R488 Other symbolic dysfunctions: Secondary | ICD-10-CM | POA: Diagnosis not present

## 2020-09-04 DIAGNOSIS — Z96641 Presence of right artificial hip joint: Secondary | ICD-10-CM | POA: Diagnosis not present

## 2020-09-04 DIAGNOSIS — R2689 Other abnormalities of gait and mobility: Secondary | ICD-10-CM | POA: Diagnosis not present

## 2020-09-04 DIAGNOSIS — I480 Paroxysmal atrial fibrillation: Secondary | ICD-10-CM | POA: Diagnosis not present

## 2020-09-04 DIAGNOSIS — Z741 Need for assistance with personal care: Secondary | ICD-10-CM | POA: Diagnosis not present

## 2020-09-04 DIAGNOSIS — M6281 Muscle weakness (generalized): Secondary | ICD-10-CM | POA: Diagnosis not present

## 2020-09-04 DIAGNOSIS — M545 Low back pain, unspecified: Secondary | ICD-10-CM | POA: Diagnosis not present

## 2020-09-05 ENCOUNTER — Other Ambulatory Visit: Payer: Self-pay | Admitting: Family Medicine

## 2020-09-05 DIAGNOSIS — I5032 Chronic diastolic (congestive) heart failure: Secondary | ICD-10-CM | POA: Diagnosis not present

## 2020-09-05 DIAGNOSIS — M545 Low back pain, unspecified: Secondary | ICD-10-CM | POA: Diagnosis not present

## 2020-09-05 DIAGNOSIS — M069 Rheumatoid arthritis, unspecified: Secondary | ICD-10-CM | POA: Diagnosis not present

## 2020-09-05 DIAGNOSIS — Z741 Need for assistance with personal care: Secondary | ICD-10-CM | POA: Diagnosis not present

## 2020-09-05 DIAGNOSIS — R2689 Other abnormalities of gait and mobility: Secondary | ICD-10-CM | POA: Diagnosis not present

## 2020-09-05 DIAGNOSIS — Z96641 Presence of right artificial hip joint: Secondary | ICD-10-CM | POA: Diagnosis not present

## 2020-09-05 DIAGNOSIS — R488 Other symbolic dysfunctions: Secondary | ICD-10-CM | POA: Diagnosis not present

## 2020-09-05 DIAGNOSIS — Z8673 Personal history of transient ischemic attack (TIA), and cerebral infarction without residual deficits: Secondary | ICD-10-CM | POA: Diagnosis not present

## 2020-09-05 DIAGNOSIS — M6281 Muscle weakness (generalized): Secondary | ICD-10-CM | POA: Diagnosis not present

## 2020-09-05 DIAGNOSIS — I480 Paroxysmal atrial fibrillation: Secondary | ICD-10-CM | POA: Diagnosis not present

## 2020-09-05 NOTE — Telephone Encounter (Signed)
Please see message from pharmacy in the medication refill request.

## 2020-09-06 DIAGNOSIS — Z741 Need for assistance with personal care: Secondary | ICD-10-CM | POA: Diagnosis not present

## 2020-09-06 DIAGNOSIS — Z8673 Personal history of transient ischemic attack (TIA), and cerebral infarction without residual deficits: Secondary | ICD-10-CM | POA: Diagnosis not present

## 2020-09-06 DIAGNOSIS — I5032 Chronic diastolic (congestive) heart failure: Secondary | ICD-10-CM | POA: Diagnosis not present

## 2020-09-06 DIAGNOSIS — I480 Paroxysmal atrial fibrillation: Secondary | ICD-10-CM | POA: Diagnosis not present

## 2020-09-06 DIAGNOSIS — M6281 Muscle weakness (generalized): Secondary | ICD-10-CM | POA: Diagnosis not present

## 2020-09-06 DIAGNOSIS — R488 Other symbolic dysfunctions: Secondary | ICD-10-CM | POA: Diagnosis not present

## 2020-09-06 DIAGNOSIS — Z96641 Presence of right artificial hip joint: Secondary | ICD-10-CM | POA: Diagnosis not present

## 2020-09-06 DIAGNOSIS — R2689 Other abnormalities of gait and mobility: Secondary | ICD-10-CM | POA: Diagnosis not present

## 2020-09-06 DIAGNOSIS — M545 Low back pain, unspecified: Secondary | ICD-10-CM | POA: Diagnosis not present

## 2020-09-06 DIAGNOSIS — M069 Rheumatoid arthritis, unspecified: Secondary | ICD-10-CM | POA: Diagnosis not present

## 2020-09-09 ENCOUNTER — Encounter (HOSPITAL_BASED_OUTPATIENT_CLINIC_OR_DEPARTMENT_OTHER): Payer: Medicare Other | Admitting: Physical Therapy

## 2020-09-09 DIAGNOSIS — M545 Low back pain, unspecified: Secondary | ICD-10-CM | POA: Diagnosis not present

## 2020-09-09 DIAGNOSIS — I480 Paroxysmal atrial fibrillation: Secondary | ICD-10-CM | POA: Diagnosis not present

## 2020-09-09 DIAGNOSIS — Z96641 Presence of right artificial hip joint: Secondary | ICD-10-CM | POA: Diagnosis not present

## 2020-09-09 DIAGNOSIS — M6281 Muscle weakness (generalized): Secondary | ICD-10-CM | POA: Diagnosis not present

## 2020-09-09 DIAGNOSIS — Z741 Need for assistance with personal care: Secondary | ICD-10-CM | POA: Diagnosis not present

## 2020-09-09 DIAGNOSIS — M069 Rheumatoid arthritis, unspecified: Secondary | ICD-10-CM | POA: Diagnosis not present

## 2020-09-09 DIAGNOSIS — R2689 Other abnormalities of gait and mobility: Secondary | ICD-10-CM | POA: Diagnosis not present

## 2020-09-09 DIAGNOSIS — I5032 Chronic diastolic (congestive) heart failure: Secondary | ICD-10-CM | POA: Diagnosis not present

## 2020-09-09 DIAGNOSIS — R488 Other symbolic dysfunctions: Secondary | ICD-10-CM | POA: Diagnosis not present

## 2020-09-09 DIAGNOSIS — Z8673 Personal history of transient ischemic attack (TIA), and cerebral infarction without residual deficits: Secondary | ICD-10-CM | POA: Diagnosis not present

## 2020-09-10 DIAGNOSIS — M545 Low back pain, unspecified: Secondary | ICD-10-CM | POA: Diagnosis not present

## 2020-09-10 DIAGNOSIS — R2689 Other abnormalities of gait and mobility: Secondary | ICD-10-CM | POA: Diagnosis not present

## 2020-09-10 DIAGNOSIS — Z741 Need for assistance with personal care: Secondary | ICD-10-CM | POA: Diagnosis not present

## 2020-09-10 DIAGNOSIS — M6281 Muscle weakness (generalized): Secondary | ICD-10-CM | POA: Diagnosis not present

## 2020-09-10 DIAGNOSIS — I5032 Chronic diastolic (congestive) heart failure: Secondary | ICD-10-CM | POA: Diagnosis not present

## 2020-09-10 DIAGNOSIS — Z8673 Personal history of transient ischemic attack (TIA), and cerebral infarction without residual deficits: Secondary | ICD-10-CM | POA: Diagnosis not present

## 2020-09-10 DIAGNOSIS — M069 Rheumatoid arthritis, unspecified: Secondary | ICD-10-CM | POA: Diagnosis not present

## 2020-09-10 DIAGNOSIS — Z96641 Presence of right artificial hip joint: Secondary | ICD-10-CM | POA: Diagnosis not present

## 2020-09-10 DIAGNOSIS — I480 Paroxysmal atrial fibrillation: Secondary | ICD-10-CM | POA: Diagnosis not present

## 2020-09-10 DIAGNOSIS — R488 Other symbolic dysfunctions: Secondary | ICD-10-CM | POA: Diagnosis not present

## 2020-09-11 ENCOUNTER — Encounter (HOSPITAL_BASED_OUTPATIENT_CLINIC_OR_DEPARTMENT_OTHER): Payer: Medicare Other | Admitting: Physical Therapy

## 2020-09-11 ENCOUNTER — Other Ambulatory Visit: Payer: Self-pay | Admitting: Cardiovascular Disease

## 2020-09-11 DIAGNOSIS — Z741 Need for assistance with personal care: Secondary | ICD-10-CM | POA: Diagnosis not present

## 2020-09-11 DIAGNOSIS — M545 Low back pain, unspecified: Secondary | ICD-10-CM | POA: Diagnosis not present

## 2020-09-11 DIAGNOSIS — Z96641 Presence of right artificial hip joint: Secondary | ICD-10-CM | POA: Diagnosis not present

## 2020-09-11 DIAGNOSIS — I5032 Chronic diastolic (congestive) heart failure: Secondary | ICD-10-CM | POA: Diagnosis not present

## 2020-09-11 DIAGNOSIS — I480 Paroxysmal atrial fibrillation: Secondary | ICD-10-CM | POA: Diagnosis not present

## 2020-09-11 DIAGNOSIS — R2689 Other abnormalities of gait and mobility: Secondary | ICD-10-CM | POA: Diagnosis not present

## 2020-09-11 DIAGNOSIS — M6281 Muscle weakness (generalized): Secondary | ICD-10-CM | POA: Diagnosis not present

## 2020-09-11 DIAGNOSIS — R488 Other symbolic dysfunctions: Secondary | ICD-10-CM | POA: Diagnosis not present

## 2020-09-11 DIAGNOSIS — Z8673 Personal history of transient ischemic attack (TIA), and cerebral infarction without residual deficits: Secondary | ICD-10-CM | POA: Diagnosis not present

## 2020-09-11 DIAGNOSIS — M069 Rheumatoid arthritis, unspecified: Secondary | ICD-10-CM | POA: Diagnosis not present

## 2020-09-12 DIAGNOSIS — I480 Paroxysmal atrial fibrillation: Secondary | ICD-10-CM | POA: Diagnosis not present

## 2020-09-12 DIAGNOSIS — I5032 Chronic diastolic (congestive) heart failure: Secondary | ICD-10-CM | POA: Diagnosis not present

## 2020-09-12 DIAGNOSIS — Z8673 Personal history of transient ischemic attack (TIA), and cerebral infarction without residual deficits: Secondary | ICD-10-CM | POA: Diagnosis not present

## 2020-09-12 DIAGNOSIS — R2689 Other abnormalities of gait and mobility: Secondary | ICD-10-CM | POA: Diagnosis not present

## 2020-09-12 DIAGNOSIS — M545 Low back pain, unspecified: Secondary | ICD-10-CM | POA: Diagnosis not present

## 2020-09-12 DIAGNOSIS — Z741 Need for assistance with personal care: Secondary | ICD-10-CM | POA: Diagnosis not present

## 2020-09-12 DIAGNOSIS — R488 Other symbolic dysfunctions: Secondary | ICD-10-CM | POA: Diagnosis not present

## 2020-09-12 DIAGNOSIS — M069 Rheumatoid arthritis, unspecified: Secondary | ICD-10-CM | POA: Diagnosis not present

## 2020-09-12 DIAGNOSIS — M6281 Muscle weakness (generalized): Secondary | ICD-10-CM | POA: Diagnosis not present

## 2020-09-12 DIAGNOSIS — Z96641 Presence of right artificial hip joint: Secondary | ICD-10-CM | POA: Diagnosis not present

## 2020-09-13 DIAGNOSIS — Z8673 Personal history of transient ischemic attack (TIA), and cerebral infarction without residual deficits: Secondary | ICD-10-CM | POA: Diagnosis not present

## 2020-09-13 DIAGNOSIS — M6281 Muscle weakness (generalized): Secondary | ICD-10-CM | POA: Diagnosis not present

## 2020-09-13 DIAGNOSIS — I5032 Chronic diastolic (congestive) heart failure: Secondary | ICD-10-CM | POA: Diagnosis not present

## 2020-09-13 DIAGNOSIS — Z741 Need for assistance with personal care: Secondary | ICD-10-CM | POA: Diagnosis not present

## 2020-09-13 DIAGNOSIS — R2689 Other abnormalities of gait and mobility: Secondary | ICD-10-CM | POA: Diagnosis not present

## 2020-09-13 DIAGNOSIS — M545 Low back pain, unspecified: Secondary | ICD-10-CM | POA: Diagnosis not present

## 2020-09-13 DIAGNOSIS — I5031 Acute diastolic (congestive) heart failure: Secondary | ICD-10-CM | POA: Diagnosis not present

## 2020-09-13 DIAGNOSIS — M069 Rheumatoid arthritis, unspecified: Secondary | ICD-10-CM | POA: Diagnosis not present

## 2020-09-13 DIAGNOSIS — I480 Paroxysmal atrial fibrillation: Secondary | ICD-10-CM | POA: Diagnosis not present

## 2020-09-13 DIAGNOSIS — Z96641 Presence of right artificial hip joint: Secondary | ICD-10-CM | POA: Diagnosis not present

## 2020-09-13 DIAGNOSIS — R488 Other symbolic dysfunctions: Secondary | ICD-10-CM | POA: Diagnosis not present

## 2020-09-16 ENCOUNTER — Ambulatory Visit: Payer: Medicare Other | Admitting: Family

## 2020-09-16 DIAGNOSIS — I480 Paroxysmal atrial fibrillation: Secondary | ICD-10-CM | POA: Diagnosis not present

## 2020-09-16 DIAGNOSIS — M545 Low back pain, unspecified: Secondary | ICD-10-CM | POA: Diagnosis not present

## 2020-09-16 DIAGNOSIS — M6281 Muscle weakness (generalized): Secondary | ICD-10-CM | POA: Diagnosis not present

## 2020-09-16 DIAGNOSIS — R488 Other symbolic dysfunctions: Secondary | ICD-10-CM | POA: Diagnosis not present

## 2020-09-16 DIAGNOSIS — Z96641 Presence of right artificial hip joint: Secondary | ICD-10-CM | POA: Diagnosis not present

## 2020-09-16 DIAGNOSIS — Z8673 Personal history of transient ischemic attack (TIA), and cerebral infarction without residual deficits: Secondary | ICD-10-CM | POA: Diagnosis not present

## 2020-09-16 DIAGNOSIS — R2689 Other abnormalities of gait and mobility: Secondary | ICD-10-CM | POA: Diagnosis not present

## 2020-09-16 DIAGNOSIS — I5032 Chronic diastolic (congestive) heart failure: Secondary | ICD-10-CM | POA: Diagnosis not present

## 2020-09-16 DIAGNOSIS — M069 Rheumatoid arthritis, unspecified: Secondary | ICD-10-CM | POA: Diagnosis not present

## 2020-09-16 DIAGNOSIS — Z741 Need for assistance with personal care: Secondary | ICD-10-CM | POA: Diagnosis not present

## 2020-09-17 DIAGNOSIS — Z96641 Presence of right artificial hip joint: Secondary | ICD-10-CM | POA: Diagnosis not present

## 2020-09-17 DIAGNOSIS — M545 Low back pain, unspecified: Secondary | ICD-10-CM | POA: Diagnosis not present

## 2020-09-17 DIAGNOSIS — R2689 Other abnormalities of gait and mobility: Secondary | ICD-10-CM | POA: Diagnosis not present

## 2020-09-17 DIAGNOSIS — I5032 Chronic diastolic (congestive) heart failure: Secondary | ICD-10-CM | POA: Diagnosis not present

## 2020-09-17 DIAGNOSIS — M6281 Muscle weakness (generalized): Secondary | ICD-10-CM | POA: Diagnosis not present

## 2020-09-17 DIAGNOSIS — I480 Paroxysmal atrial fibrillation: Secondary | ICD-10-CM | POA: Diagnosis not present

## 2020-09-17 DIAGNOSIS — R488 Other symbolic dysfunctions: Secondary | ICD-10-CM | POA: Diagnosis not present

## 2020-09-17 DIAGNOSIS — Z8673 Personal history of transient ischemic attack (TIA), and cerebral infarction without residual deficits: Secondary | ICD-10-CM | POA: Diagnosis not present

## 2020-09-17 DIAGNOSIS — Z741 Need for assistance with personal care: Secondary | ICD-10-CM | POA: Diagnosis not present

## 2020-09-17 DIAGNOSIS — M069 Rheumatoid arthritis, unspecified: Secondary | ICD-10-CM | POA: Diagnosis not present

## 2020-09-18 DIAGNOSIS — R2689 Other abnormalities of gait and mobility: Secondary | ICD-10-CM | POA: Diagnosis not present

## 2020-09-18 DIAGNOSIS — R488 Other symbolic dysfunctions: Secondary | ICD-10-CM | POA: Diagnosis not present

## 2020-09-18 DIAGNOSIS — I482 Chronic atrial fibrillation, unspecified: Secondary | ICD-10-CM | POA: Diagnosis not present

## 2020-09-18 DIAGNOSIS — I480 Paroxysmal atrial fibrillation: Secondary | ICD-10-CM | POA: Diagnosis not present

## 2020-09-18 DIAGNOSIS — M069 Rheumatoid arthritis, unspecified: Secondary | ICD-10-CM | POA: Diagnosis not present

## 2020-09-18 DIAGNOSIS — Z8673 Personal history of transient ischemic attack (TIA), and cerebral infarction without residual deficits: Secondary | ICD-10-CM | POA: Diagnosis not present

## 2020-09-18 DIAGNOSIS — Z741 Need for assistance with personal care: Secondary | ICD-10-CM | POA: Diagnosis not present

## 2020-09-18 DIAGNOSIS — Z96641 Presence of right artificial hip joint: Secondary | ICD-10-CM | POA: Diagnosis not present

## 2020-09-18 DIAGNOSIS — I5032 Chronic diastolic (congestive) heart failure: Secondary | ICD-10-CM | POA: Diagnosis not present

## 2020-09-18 DIAGNOSIS — M6281 Muscle weakness (generalized): Secondary | ICD-10-CM | POA: Diagnosis not present

## 2020-09-18 DIAGNOSIS — M545 Low back pain, unspecified: Secondary | ICD-10-CM | POA: Diagnosis not present

## 2020-09-19 DIAGNOSIS — I480 Paroxysmal atrial fibrillation: Secondary | ICD-10-CM | POA: Diagnosis not present

## 2020-09-19 DIAGNOSIS — M545 Low back pain, unspecified: Secondary | ICD-10-CM | POA: Diagnosis not present

## 2020-09-19 DIAGNOSIS — R488 Other symbolic dysfunctions: Secondary | ICD-10-CM | POA: Diagnosis not present

## 2020-09-19 DIAGNOSIS — M069 Rheumatoid arthritis, unspecified: Secondary | ICD-10-CM | POA: Diagnosis not present

## 2020-09-19 DIAGNOSIS — I5032 Chronic diastolic (congestive) heart failure: Secondary | ICD-10-CM | POA: Diagnosis not present

## 2020-09-19 DIAGNOSIS — Z96641 Presence of right artificial hip joint: Secondary | ICD-10-CM | POA: Diagnosis not present

## 2020-09-19 DIAGNOSIS — M6281 Muscle weakness (generalized): Secondary | ICD-10-CM | POA: Diagnosis not present

## 2020-09-19 DIAGNOSIS — Z741 Need for assistance with personal care: Secondary | ICD-10-CM | POA: Diagnosis not present

## 2020-09-19 DIAGNOSIS — R2689 Other abnormalities of gait and mobility: Secondary | ICD-10-CM | POA: Diagnosis not present

## 2020-09-19 DIAGNOSIS — Z8673 Personal history of transient ischemic attack (TIA), and cerebral infarction without residual deficits: Secondary | ICD-10-CM | POA: Diagnosis not present

## 2020-09-20 DIAGNOSIS — R488 Other symbolic dysfunctions: Secondary | ICD-10-CM | POA: Diagnosis not present

## 2020-09-20 DIAGNOSIS — I480 Paroxysmal atrial fibrillation: Secondary | ICD-10-CM | POA: Diagnosis not present

## 2020-09-20 DIAGNOSIS — M069 Rheumatoid arthritis, unspecified: Secondary | ICD-10-CM | POA: Diagnosis not present

## 2020-09-20 DIAGNOSIS — M6281 Muscle weakness (generalized): Secondary | ICD-10-CM | POA: Diagnosis not present

## 2020-09-20 DIAGNOSIS — M545 Low back pain, unspecified: Secondary | ICD-10-CM | POA: Diagnosis not present

## 2020-09-20 DIAGNOSIS — Z96641 Presence of right artificial hip joint: Secondary | ICD-10-CM | POA: Diagnosis not present

## 2020-09-20 DIAGNOSIS — Z741 Need for assistance with personal care: Secondary | ICD-10-CM | POA: Diagnosis not present

## 2020-09-20 DIAGNOSIS — Z8673 Personal history of transient ischemic attack (TIA), and cerebral infarction without residual deficits: Secondary | ICD-10-CM | POA: Diagnosis not present

## 2020-09-20 DIAGNOSIS — I5032 Chronic diastolic (congestive) heart failure: Secondary | ICD-10-CM | POA: Diagnosis not present

## 2020-09-20 DIAGNOSIS — R2689 Other abnormalities of gait and mobility: Secondary | ICD-10-CM | POA: Diagnosis not present

## 2020-09-23 DIAGNOSIS — I480 Paroxysmal atrial fibrillation: Secondary | ICD-10-CM | POA: Diagnosis not present

## 2020-09-23 DIAGNOSIS — M6281 Muscle weakness (generalized): Secondary | ICD-10-CM | POA: Diagnosis not present

## 2020-09-23 DIAGNOSIS — Z8673 Personal history of transient ischemic attack (TIA), and cerebral infarction without residual deficits: Secondary | ICD-10-CM | POA: Diagnosis not present

## 2020-09-23 DIAGNOSIS — I5032 Chronic diastolic (congestive) heart failure: Secondary | ICD-10-CM | POA: Diagnosis not present

## 2020-09-23 DIAGNOSIS — M069 Rheumatoid arthritis, unspecified: Secondary | ICD-10-CM | POA: Diagnosis not present

## 2020-09-23 DIAGNOSIS — M545 Low back pain, unspecified: Secondary | ICD-10-CM | POA: Diagnosis not present

## 2020-09-23 DIAGNOSIS — R2689 Other abnormalities of gait and mobility: Secondary | ICD-10-CM | POA: Diagnosis not present

## 2020-09-23 DIAGNOSIS — R488 Other symbolic dysfunctions: Secondary | ICD-10-CM | POA: Diagnosis not present

## 2020-09-23 DIAGNOSIS — Z96641 Presence of right artificial hip joint: Secondary | ICD-10-CM | POA: Diagnosis not present

## 2020-09-23 DIAGNOSIS — Z741 Need for assistance with personal care: Secondary | ICD-10-CM | POA: Diagnosis not present

## 2020-09-23 DIAGNOSIS — Z8619 Personal history of other infectious and parasitic diseases: Secondary | ICD-10-CM | POA: Diagnosis not present

## 2020-09-23 DIAGNOSIS — K5903 Drug induced constipation: Secondary | ICD-10-CM | POA: Diagnosis not present

## 2020-09-24 DIAGNOSIS — R488 Other symbolic dysfunctions: Secondary | ICD-10-CM | POA: Diagnosis not present

## 2020-09-24 DIAGNOSIS — M545 Low back pain, unspecified: Secondary | ICD-10-CM | POA: Diagnosis not present

## 2020-09-24 DIAGNOSIS — R2689 Other abnormalities of gait and mobility: Secondary | ICD-10-CM | POA: Diagnosis not present

## 2020-09-24 DIAGNOSIS — I480 Paroxysmal atrial fibrillation: Secondary | ICD-10-CM | POA: Diagnosis not present

## 2020-09-24 DIAGNOSIS — Z741 Need for assistance with personal care: Secondary | ICD-10-CM | POA: Diagnosis not present

## 2020-09-24 DIAGNOSIS — M069 Rheumatoid arthritis, unspecified: Secondary | ICD-10-CM | POA: Diagnosis not present

## 2020-09-24 DIAGNOSIS — Z8673 Personal history of transient ischemic attack (TIA), and cerebral infarction without residual deficits: Secondary | ICD-10-CM | POA: Diagnosis not present

## 2020-09-24 DIAGNOSIS — M6281 Muscle weakness (generalized): Secondary | ICD-10-CM | POA: Diagnosis not present

## 2020-09-24 DIAGNOSIS — I5032 Chronic diastolic (congestive) heart failure: Secondary | ICD-10-CM | POA: Diagnosis not present

## 2020-09-24 DIAGNOSIS — Z96641 Presence of right artificial hip joint: Secondary | ICD-10-CM | POA: Diagnosis not present

## 2020-09-25 ENCOUNTER — Ambulatory Visit: Payer: Medicare Other | Admitting: Neurology

## 2020-09-25 ENCOUNTER — Encounter: Payer: Self-pay | Admitting: Neurology

## 2020-09-25 VITALS — BP 152/70 | HR 60 | Ht 64.5 in | Wt 157.0 lb

## 2020-09-25 DIAGNOSIS — G3184 Mild cognitive impairment, so stated: Secondary | ICD-10-CM | POA: Diagnosis not present

## 2020-09-25 DIAGNOSIS — G459 Transient cerebral ischemic attack, unspecified: Secondary | ICD-10-CM | POA: Diagnosis not present

## 2020-09-25 DIAGNOSIS — R4701 Aphasia: Secondary | ICD-10-CM | POA: Diagnosis not present

## 2020-09-25 DIAGNOSIS — E538 Deficiency of other specified B group vitamins: Secondary | ICD-10-CM | POA: Diagnosis not present

## 2020-09-25 DIAGNOSIS — G239 Degenerative disease of basal ganglia, unspecified: Secondary | ICD-10-CM | POA: Diagnosis not present

## 2020-09-25 DIAGNOSIS — R7989 Other specified abnormal findings of blood chemistry: Secondary | ICD-10-CM | POA: Diagnosis not present

## 2020-09-25 DIAGNOSIS — Z79899 Other long term (current) drug therapy: Secondary | ICD-10-CM | POA: Diagnosis not present

## 2020-09-25 MED ORDER — ATORVASTATIN CALCIUM 80 MG PO TABS: 80.0000 mg | ORAL_TABLET | Freq: Every day | ORAL | 3 refills | Status: DC

## 2020-09-25 NOTE — Progress Notes (Signed)
[facility] [address] [place] [address] [phone]       OFFICE CONSULT NOTE  [name] Date of Birth:  09/06/1934 Medical Record Number:  [id number]   Referring MD: Lennice Sites  Reason for Referral:  aphasia  HPI: Betty Jackson is 86 year old pleasant Caucasian lady seen today for initial office consultation visit.  She is accompanied by her daughter.  History is obtained from them and review of electronic medical records and I personally reviewed pertinent available imaging films in PACS.  She has past medical history of hypertension, hyperlipidemia, coronary artery disease, stroke, chronic kidney disease, temporal arteritis, severe back pain and spinal stenosis and long-term pain medications.  Patient had transient episode of expressive aphasia on 07/14/2020.  She states she knew what she wanted to speak with was unable to get the words out.  This can happen suddenly without warning.  It lasted about an hour or so and then symptoms resolved.  She was seen by Dr. Leonel Ramsay neuro hospitalist who noted the patient's aphasia completely resolved by the time he saw the patient.  MRI scan of the brain was obtained which showed extensive changes of small vessel disease and mild atrophy but no acute stroke was noted.  Lab work including CBC, lactic acid, ammonia, UA, chest x-ray and CT scan of the abdomen were all unremarkable.  Patient was discharged home and came back 3 days later again with a similar episode lasting an hour or less.  She was also slightly confused and daughter felt that patient and gotten extra dose of her pain medications.  She has bad rheumatoid arthritis and back pain and spinal stenosis and is on fentanyl patch as well as hydrocodone tablets.  The daughter has noticed some memory difficulties and confusion and she feels this may be related to pain medications.  This fluctuates.  She needs constant reminders and short-term memory is poor.  She has  recently moved to living in a nursing facility medications are provided to her.  She has not been ambulating in recent months and most spends most of the time in the wheelchair due to severe back pain.  She does not help to transfer from wheelchair to chair but is not walking a lot. She has no prior history of seizures, tonic-clonic activity, loss of consciousness, syncope, significant head injury with loss of consciousness.  There is no family history of Alzheimer's or dementia.  She has remote history of temporal arteritis in 2011 for which she is on long-term prednisone and she currently denies significant headaches or scalp tenderness. ROS:   14 system review of systems is positive for speech difficulties, aphasia, back pain, difficulty walking, confusion, disorientation, memory difficulties all other systems negative  PMH:  Past Medical History:  Diagnosis Date   Acute respiratory failure with hypoxia ([place]) 05/20/2017   Anemia, unspecified 10/28/2012   Anxiety state, unspecified 10/28/2012   CAD (coronary artery disease)    a. Stent RCA in [facility];  b. Cath approx 2009 - nonobs per pt report.   Cataract    immature on the left eye   Chronic insomnia 02/07/2013   Chronic lower back pain    scoliosis   CKD (chronic kidney disease) stage 3, GFR 30-59 ml/min (HCC)    CVA (cerebral infarction) 10/29/2012   DDD (degenerative disc disease)    Diverticulosis    GERD (gastroesophageal reflux disease) 09/01/2010   Hemorrhoids    Herniated nucleus pulposus, L5-S1, right 11/04/2015  Hyperlipidemia    takes Lipitor daily   Hypertension    takes Amlodipine,Losartan,Metoprolol,and HCTZ daily   Incontinence of urine    Insomnia    takes Restoril nightly   Lumbar stenosis 04/25/2013   Major depressive disorder, recurrent episode, moderate ([place]) 07/16/2013   Osteoporosis    PAF (paroxysmal atrial fibrillation) ([place]) 2011   a. lone epidode in 2011 according to notes.   Rheumatoid  arthritis ([place])    Scoliosis    Sepsis ([place]) 05/2019   Stroke ([place])    Temporal arteritis ([place]) 2011   a. followed @ [place]; potential flareup 10/28/2012/notes 10/28/2012   Vocal cord dysfunction    "they don't operate properly" (10/28/2012)    Social History:  Social History   Socioeconomic History   Marital status: Married    Spouse name: Not on file   Number of children: 3   Years of education: Not on file   Highest education level: Not on file  Occupational History   Occupation: Retired Cabin crew  Tobacco Use   Smoking status: Never   Smokeless tobacco: Never  Vaping Use   Vaping Use: Never used  Substance and Sexual Activity   Alcohol use: Yes    Comment: socially   Drug use: No   Sexual activity: Not on file  Other Topics Concern   Not on file  Social History Narrative   Lives at [facility], [place]   Right Handed   Drinks 1-2 cups caffeine daily   Social Determinants of Health   Financial Resource Strain: Low Risk    Difficulty of Paying Living Expenses: Not hard at all  Food Insecurity: No Food Insecurity   Worried About Charity fundraiser in the Last Year: Never true   [place] in the Last Year: Never true  Transportation Needs: No Transportation Needs   Lack of Transportation (Medical): No   Lack of Transportation (Non-Medical): No  Physical Activity: Inactive   Days of Exercise per Week: 0 days   Minutes of Exercise per Session: 0 min  Stress: Not on file  Social Connections: Unknown   Frequency of Communication with Friends and Family: More than three times a week   Frequency of Social Gatherings with Friends and Family: More than three times a week   Attends Religious Services: Never   Marine scientist or Organizations: No   Attends Music therapist: Never   Marital Status: Patient refused  Human resources officer Violence: Not At Risk   Fear of Current or Ex-Partner: No   Emotionally Abused: No   Physically Abused: No    Sexually Abused: No    Medications:   Current Outpatient Medications on File Prior to Visit  Medication Sig Dispense Refill   acetaminophen (TYLENOL) 650 MG CR tablet Take 1,300 mg by mouth daily as needed for pain.     alendronate (FOSAMAX) 70 MG tablet Take 1 tablet (70 mg total) by mouth every 7 (seven) days. Take with a full glass of water on an empty stomach. (Patient taking differently: Take 70 mg by mouth every Thursday. Take with a full glass of water on an empty stomach.) 4 tablet 11   Ascorbic Acid (VITAMIN C PO) Take 1 tablet by mouth daily with supper.     Calcium Carbonate Antacid (TUMS PO) Take 1 tablet by mouth daily with supper.     cholecalciferol (VITAMIN D) 25 MCG tablet Take 2 tablets (2,000 Units total) by mouth 2 (two) times  daily. (Patient taking differently: Take 2,000 Units by mouth daily with supper.)     diltiazem (CARDIZEM CD) 240 MG 24 hr capsule Take 1 capsule (240 mg total) by mouth daily. (Patient taking differently: Take 240 mg by mouth daily with supper.) 90 capsule 3   docusate sodium (COLACE) 100 MG capsule Take 100 mg by mouth daily as needed for mild constipation.     ELIQUIS 5 MG TABS tablet TAKE 1 TABLET BY MOUTH TWICE A DAY (Patient taking differently: Take 5 mg by mouth 2 (two) times daily with a meal.) 60 tablet 5   escitalopram (LEXAPRO) 10 MG tablet TAKE 1 TABLET BY MOUTH EVERY DAY 90 tablet 1   fentaNYL (DURAGESIC) 25 MCG/HR Place 1 patch onto the skin every 3 (three) days. 10 patch 0   furosemide (LASIX) 40 MG tablet TAKE 1 TABLET BY MOUTH DAILY. PLEASE SCHEDULE APPOINTMENT FOR FUTURE REFILLS. THANK YOU 30 tablet 0   HYDROcodone-acetaminophen (NORCO) 10-325 MG tablet Take one tablet by mouth every 8 hours as needed for breakthrough pain. 60 tablet 0   LACTOBACILLUS PO Take 1 capsule by mouth daily with supper.     losartan (COZAAR) 50 MG tablet Take 1 tablet (50 mg total) by mouth daily. (Patient taking differently: Take 50 mg by mouth daily with  supper.) 90 tablet 3   meloxicam (MOBIC) 7.5 MG tablet Take 7.5 mg by mouth daily.     metoprolol tartrate (LOPRESSOR) 100 MG tablet TAKE 1 TABLET BY MOUTH TWICE A DAY 60 tablet 0   Multiple Vitamin (MULTIVITAMIN WITH MINERALS) TABS tablet Take 1 tablet by mouth daily. 60 tablet 0   potassium chloride (KLOR-CON) 10 MEQ tablet Take 1 tablet (10 mEq total) by mouth daily. (Patient taking differently: Take 10 mEq by mouth daily with supper.) 90 tablet 2   predniSONE (DELTASONE) 10 MG tablet Take 1 tablet (10 mg total) by mouth daily with breakfast. 30 tablet 0   QUEtiapine (SEROQUEL) 25 MG tablet TAKE 1 TO 2 TABLETS BY MOUTH AT NIGHT AS NEEDED FOR INSOMNIA AND AGITATION 180 tablet 2   senna-docusate (SENOKOT-S) 8.6-50 MG tablet Take 1 tablet by mouth every other day.     VITAMIN E PO Take 1 capsule by mouth daily with supper.     ferrous sulfate 325 (65 FE) MG tablet Take 1 tablet (325 mg total) by mouth 2 (two) times daily with a meal. (Patient taking differently: Take 325 mg by mouth daily with supper.) 60 tablet 3   MAGNESIUM PO Take 1 tablet by mouth 2 (two) times a week.     No current facility-administered medications on file prior to visit.    Allergies:   Allergies  Allergen Reactions   Dextromethorphan Rash    Physical Exam General: Mildly obese elderly Caucasian lady, seated, in no evident distress Head: head normocephalic and atraumatic.   Neck: supple with no carotid or supraclavicular bruits Cardiovascular: regular rate and rhythm, no murmurs Musculoskeletal: Severe rheumatoid arthritis deformity of both hands. Skin:  no rash/petichiae Vascular:  Normal pulses all extremities  Neurologic Exam Mental Status: Awake and fully alert. Oriented to place and time. Recent and remote memory diminished.. Attention span, concentration and fund of knowledge diminished. Mood and affect appropriate.  Recall 2/3.  Able to name 9 animals which can walk on 4 legs.  Clock drawing 4/4. Cranial  Nerves: Fundoscopic exam reveals sharp disc margins. Pupils equal, briskly reactive to light. Extraocular movements full without nystagmus. Visual fields full to confrontation.  Hearing intact. Facial sensation intact. Face, tongue, palate moves normally and symmetrically.  Motor: Normal bulk and tone. Normal strength in all tested extremity muscles. Sensory.: intact to touch , pinprick , position and vibratory sensation.  Coordination: Rapid alternating movements normal in all extremities. Finger-to-nose and heel-to-shin performed accurately bilaterally. Gait and Station: Gait deferred as patient has severe back pain and does not walk even at baseline.   Reflexes: 1+ and symmetric. Toes downgoing.   NIHSS  0 Modified Rankin  4   ASSESSMENT: 86 year old Caucasian lady with 2 transient episodes of expressive aphasia in April 2022 likely left hemispheric TIAs.  Simple partial seizures or early primary progressive aphasia is less likely.  Vascular risk factors of atrial fibrillation, hypertension, mild obesity and hyperlipidemia.     PLAN:I had a long d/w patient about about her to recurrent episodes of transient expressive aphasia possibly TIAs than simple partial seizures in the setting of her atrial fibrillation despite being on Eliquis.  And I personally independently reviewed imaging studies and stroke evaluation results and answered questions.Continue Eliquis (apixaban) 5 mg twice daily  for secondary stroke prevention and maintain strict control of hypertension with blood pressure goal below 130/90, diabetes with hemoglobin A1c goal below 6.5% and lipids with LDL cholesterol goal below 70 mg/dL. Marland KitchenCheck memory panel labs, EEG.  We discussed memory compensation strategies.  She was advised to increase participation in cognitively challenging activities like solving crossword puzzles, playing bridge and sodoku.  Followup in the future with me in 3 months or call earlier if necessary.  Greater than  50% time during this 45-minute consultation visit was spent on counseling and coordination of care about her episodes of aphasia, TIA and memory loss and answering questions.  Antony Contras, MD   Note: This document was prepared with digital dictation and possible smart phrase technology. Any transcriptional errors that result from this process are unintentional.

## 2020-09-25 NOTE — Patient Instructions (Signed)
I had a long d/w patient about about her to recurrent episodes of transient expressive aphasia possibly TIAs than simple partial seizures in the setting of her atrial fibrillation despite being on Eliquis.  And I personally independently reviewed imaging studies and stroke evaluation results and answered questions.Continue Eliquis (apixaban) 5 mg twice daily  for secondary stroke prevention and maintain strict control of hypertension with blood pressure goal below 130/90, diabetes with hemoglobin A1c goal below 6.5% and lipids with LDL cholesterol goal below 70 mg/dL. Marland KitchenCheck memory panel labs, EEG.  We discussed memory compensation strategies.  She was advised to increase participation in cognitively challenging activities like solving crossword puzzles, playing bridge and sodoku.  Followup in the future with me in 3 months or call earlier if necessary. Memory Compensation Strategies  Use "WARM" strategy.  W= write it down  A= associate it  R= repeat it  M= make a mental note  2.   You can keep a Social worker.  Use a 3-ring notebook with sections for the following: calendar, important names and phone numbers,  medications, doctors' names/phone numbers, lists/reminders, and a section to journal what you did  each day.   3.    Use a calendar to write appointments down.  4.    Write yourself a schedule for the day.  This can be placed on the calendar or in a separate section of the Memory Notebook.  Keeping a  regular schedule can help memory.  5.    Use medication organizer with sections for each day or morning/evening pills.  You may need help loading it  6.    Keep a basket, or pegboard by the door.  Place items that you need to take out with you in the basket or on the pegboard.  You may also want to  include a message board for reminders.  7.    Use sticky notes.  Place sticky notes with reminders in a place where the task is performed.  For example: " turn off the  stove" placed by the  stove, "lock the door" placed on the door at eye level, " take your medications" on  the bathroom mirror or by the place where you normally take your medications.  8.    Use alarms/timers.  Use while cooking to remind yourself to check on food or as a reminder to take your medicine, or as a  reminder to make a call, or as a reminder to perform another task, etc.

## 2020-09-26 DIAGNOSIS — M069 Rheumatoid arthritis, unspecified: Secondary | ICD-10-CM | POA: Diagnosis not present

## 2020-09-26 DIAGNOSIS — R488 Other symbolic dysfunctions: Secondary | ICD-10-CM | POA: Diagnosis not present

## 2020-09-26 DIAGNOSIS — R2689 Other abnormalities of gait and mobility: Secondary | ICD-10-CM | POA: Diagnosis not present

## 2020-09-26 DIAGNOSIS — Z96641 Presence of right artificial hip joint: Secondary | ICD-10-CM | POA: Diagnosis not present

## 2020-09-26 DIAGNOSIS — Z741 Need for assistance with personal care: Secondary | ICD-10-CM | POA: Diagnosis not present

## 2020-09-26 DIAGNOSIS — I480 Paroxysmal atrial fibrillation: Secondary | ICD-10-CM | POA: Diagnosis not present

## 2020-09-26 DIAGNOSIS — Z8673 Personal history of transient ischemic attack (TIA), and cerebral infarction without residual deficits: Secondary | ICD-10-CM | POA: Diagnosis not present

## 2020-09-26 DIAGNOSIS — M545 Low back pain, unspecified: Secondary | ICD-10-CM | POA: Diagnosis not present

## 2020-09-26 DIAGNOSIS — I5032 Chronic diastolic (congestive) heart failure: Secondary | ICD-10-CM | POA: Diagnosis not present

## 2020-09-26 DIAGNOSIS — M6281 Muscle weakness (generalized): Secondary | ICD-10-CM | POA: Diagnosis not present

## 2020-09-26 LAB — DEMENTIA PANEL
Homocysteine: 11 umol/L (ref 0.0–21.3)
RPR Ser Ql: NONREACTIVE
TSH: 1.68 u[IU]/mL (ref 0.450–4.500)
Vitamin B-12: 773 pg/mL (ref 232–1245)

## 2020-09-27 NOTE — Progress Notes (Signed)
Kindly inform the patient that lab work for reversible causes of memory loss was all normal

## 2020-10-01 ENCOUNTER — Encounter: Payer: Self-pay | Admitting: *Deleted

## 2020-10-02 ENCOUNTER — Telehealth: Payer: Self-pay | Admitting: Neurology

## 2020-10-02 NOTE — Telephone Encounter (Signed)
Pt's daughter informed of EEG time/location.

## 2020-10-03 ENCOUNTER — Other Ambulatory Visit: Payer: Self-pay | Admitting: Cardiovascular Disease

## 2020-10-11 DIAGNOSIS — H1033 Unspecified acute conjunctivitis, bilateral: Secondary | ICD-10-CM | POA: Diagnosis not present

## 2020-10-17 ENCOUNTER — Other Ambulatory Visit: Payer: Self-pay | Admitting: Cardiovascular Disease

## 2020-10-17 DIAGNOSIS — Z7952 Long term (current) use of systemic steroids: Secondary | ICD-10-CM | POA: Diagnosis not present

## 2020-10-17 DIAGNOSIS — Z961 Presence of intraocular lens: Secondary | ICD-10-CM | POA: Diagnosis not present

## 2020-10-17 DIAGNOSIS — H0288B Meibomian gland dysfunction left eye, upper and lower eyelids: Secondary | ICD-10-CM | POA: Diagnosis not present

## 2020-10-17 DIAGNOSIS — H0288A Meibomian gland dysfunction right eye, upper and lower eyelids: Secondary | ICD-10-CM | POA: Diagnosis not present

## 2020-10-21 DIAGNOSIS — M1711 Unilateral primary osteoarthritis, right knee: Secondary | ICD-10-CM | POA: Diagnosis not present

## 2020-10-21 DIAGNOSIS — S72451D Displaced supracondylar fracture without intracondylar extension of lower end of right femur, subsequent encounter for closed fracture with routine healing: Secondary | ICD-10-CM | POA: Diagnosis not present

## 2020-10-21 DIAGNOSIS — M25561 Pain in right knee: Secondary | ICD-10-CM | POA: Diagnosis not present

## 2020-10-21 DIAGNOSIS — S72431D Displaced fracture of medial condyle of right femur, subsequent encounter for closed fracture with routine healing: Secondary | ICD-10-CM | POA: Diagnosis not present

## 2020-10-23 DIAGNOSIS — M069 Rheumatoid arthritis, unspecified: Secondary | ICD-10-CM | POA: Diagnosis not present

## 2020-10-23 DIAGNOSIS — R051 Acute cough: Secondary | ICD-10-CM | POA: Diagnosis not present

## 2020-10-23 DIAGNOSIS — N182 Chronic kidney disease, stage 2 (mild): Secondary | ICD-10-CM | POA: Diagnosis not present

## 2020-10-23 DIAGNOSIS — I482 Chronic atrial fibrillation, unspecified: Secondary | ICD-10-CM | POA: Diagnosis not present

## 2020-10-23 DIAGNOSIS — M316 Other giant cell arteritis: Secondary | ICD-10-CM | POA: Diagnosis not present

## 2020-10-23 DIAGNOSIS — I129 Hypertensive chronic kidney disease with stage 1 through stage 4 chronic kidney disease, or unspecified chronic kidney disease: Secondary | ICD-10-CM | POA: Diagnosis not present

## 2020-10-23 DIAGNOSIS — I5030 Unspecified diastolic (congestive) heart failure: Secondary | ICD-10-CM | POA: Diagnosis not present

## 2020-10-25 DIAGNOSIS — I5032 Chronic diastolic (congestive) heart failure: Secondary | ICD-10-CM | POA: Diagnosis not present

## 2020-10-29 ENCOUNTER — Ambulatory Visit (HOSPITAL_COMMUNITY): Payer: Medicare Other

## 2020-10-30 ENCOUNTER — Other Ambulatory Visit: Payer: Self-pay | Admitting: Physician Assistant

## 2020-11-01 NOTE — Telephone Encounter (Signed)
Please refill.  Pt has upcoming appointment with APP at [facility] 11/14/20.

## 2020-11-14 ENCOUNTER — Ambulatory Visit (INDEPENDENT_AMBULATORY_CARE_PROVIDER_SITE_OTHER): Payer: Medicare Other | Admitting: Family

## 2020-11-14 ENCOUNTER — Other Ambulatory Visit: Payer: Self-pay

## 2020-11-14 ENCOUNTER — Encounter (HOSPITAL_BASED_OUTPATIENT_CLINIC_OR_DEPARTMENT_OTHER): Payer: Self-pay | Admitting: Family

## 2020-11-14 VITALS — BP 128/80 | HR 56 | Ht 64.5 in | Wt 163.2 lb

## 2020-11-14 DIAGNOSIS — I25118 Atherosclerotic heart disease of native coronary artery with other forms of angina pectoris: Secondary | ICD-10-CM | POA: Diagnosis not present

## 2020-11-14 DIAGNOSIS — I5032 Chronic diastolic (congestive) heart failure: Secondary | ICD-10-CM | POA: Diagnosis not present

## 2020-11-14 DIAGNOSIS — Z01810 Encounter for preprocedural cardiovascular examination: Secondary | ICD-10-CM | POA: Diagnosis not present

## 2020-11-14 DIAGNOSIS — I480 Paroxysmal atrial fibrillation: Secondary | ICD-10-CM

## 2020-11-14 NOTE — Progress Notes (Signed)
Office Visit    Patient Name: Betty Jackson Date of Encounter: 11/14/2020  PCP:  Eulas Post, MD   [place]  Cardiologist:  [name], MD  Advanced Practice Provider:  No care team member to display Electrophysiologist:  None    Chief Complaint    Betty Jackson is a 86 y.o. female with a hx of CAD, PAF, HTN, HLD, chronic diastolic heart failure, pericardial effusion s/p window 02/2019, CKDIII, temporal arteritis  presents today for preoperative clearance   Past Medical History    Past Medical History:  Diagnosis Date   Acute respiratory failure with hypoxia ([place]) 05/20/2017   Anemia, unspecified 10/28/2012   Anxiety state, unspecified 10/28/2012   CAD (coronary artery disease)    a. Stent RCA in [facility];  b. Cath approx 2009 - nonobs per pt report.   Cataract    immature on the left eye   Chronic insomnia 02/07/2013   Chronic lower back pain    scoliosis   CKD (chronic kidney disease) stage 3, GFR 30-59 ml/min (HCC)    CVA (cerebral infarction) 10/29/2012   DDD (degenerative disc disease)    Diverticulosis    GERD (gastroesophageal reflux disease) 09/01/2010   Hemorrhoids    Herniated nucleus pulposus, L5-S1, right 11/04/2015   Hyperlipidemia    takes Lipitor daily   Hypertension    takes Amlodipine,Losartan,Metoprolol,and HCTZ daily   Incontinence of urine    Insomnia    takes Restoril nightly   Lumbar stenosis 04/25/2013   Major depressive disorder, recurrent episode, moderate ([place]) 07/16/2013   Osteoporosis    PAF (paroxysmal atrial fibrillation) ([place]) 2011   a. lone epidode in 2011 according to notes.   Rheumatoid arthritis ([place])    Scoliosis    Sepsis ([place]) 05/2019   Stroke ([place])    Temporal arteritis ([place]) 2011   a. followed @ [place]; potential flareup 10/28/2012/notes 10/28/2012   Vocal cord dysfunction    "they don't operate properly" (10/28/2012)   Past Surgical History:  Procedure Laterality Date    ABDOMINAL HYSTERECTOMY  ~ 1984   vaginally   BACK SURGERY  7-93yr ago   X Stop   BLADDER SUSPENSION  2001   BREAST BIOPSY Right    CATARACT EXTRACTION W/ INTRAOCULAR LENS IMPLANT Right ~ 08/2012   CHEST TUBE INSERTION Left 03/08/2019   Procedure: Chest Tube Insertion;  Surgeon: VIvin Poot MD;  [facility]  Service: Thoracic;  Laterality: Left;   COLONOSCOPY  01/26/2012   Procedure: COLONOSCOPY;  Surgeon: MLadene Artist MD,FACG;  [facility]  Service: Endoscopy;  Laterality: N/A;  note the EGD is possible   CORONARY ANGIOPLASTY WITH STENT PLACEMENT  2006   X 1 stent   EPIDURAL BLOCK INJECTION     ESOPHAGOGASTRODUODENOSCOPY  01/26/2012   Procedure: ESOPHAGOGASTRODUODENOSCOPY (EGD);  Surgeon: MLadene Artist MD,FACG;  [facility];  Service: Endoscopy;  Laterality: N/A;   FINE NEEDLE ASPIRATION Right 09/26/2019   Procedure: FINE NEEDLE ASPIRATION;  Surgeon: HAltamese Fostoria MD;  [facility]  Service: Orthopedics;  Laterality: Right;   HEMIARTHROPLASTY HIP Right 2012   IR EPIDUROGRAPHY  04/20/2019   LAPAROSCOPIC CHOLECYSTECTOMY  2001   LUMBAR FUSION  03/2013   LUMBAR LAMINECTOMY/DECOMPRESSION MICRODISCECTOMY Right 11/04/2015   Procedure: Right Lumbar Five-Sacral One Microdiskectomy;  Surgeon: HKristeen Miss MD;  [facility];  Service: Neurosurgery;  Laterality: Right;  Right L5-S1 Microdiskectomy   moles removed that required  stiches     one on leg and one on face   ORIF FEMUR FRACTURE Right 09/26/2019   Procedure: OPEN REDUCTION INTERNAL FIXATION (ORIF) DISTAL FEMUR FRACTURE;  Surgeon: Altamese Lu Verne, MD;  Location: [place];  Service: Orthopedics;  Laterality: Right;   ORIF FEMUR FRACTURE Right 10/17/2019   Procedure: OPEN REDUCTION INTERNAL FIXATION (ORIF) DISTAL FEMUR FRACTURE;  Surgeon: Altamese Sheridan, MD;  Location: [place];  Service: Orthopedics;  Laterality: Right;   SUBXYPHOID PERICARDIAL WINDOW N/A 03/08/2019   Procedure: SUBXYPHOID PERICARDIAL  WINDOW;  Surgeon: Ivin Poot, MD;  Location: [place];  Service: Thoracic;  Laterality: N/A;   TEE WITHOUT CARDIOVERSION N/A 03/08/2019   Procedure: TRANSESOPHAGEAL ECHOCARDIOGRAM (TEE);  Surgeon: Prescott Gum, Collier Salina, MD;  Location: [facility];  Service: Thoracic;  Laterality: N/A;   TEMPORAL ARTERY BIOPSY / LIGATION Bilateral 2011   TONSILLECTOMY AND ADENOIDECTOMY     at age 89   [place]  ~ [place]  ~ 2010   "lower back" (10/28/2012)    Allergies  Allergies  Allergen Reactions   Dextromethorphan Rash    History of Present Illness    Betty Jackson is a 86 y.o. female with a hx of CAD, PAF, HTN, HLD, chronic diastolic heart failure, pericardial effusion s/p window 02/2019, CKDIII, temporal arteritis last seen 05/29/19 by Robbie Lis, PA.  History of coronary artery disease with previous stenting to the RCA in 2005.  Chronic catheterization September 2014 with patent RCA stent, mild disease in left circumflex and LAD.  Stress test June 2019 normal.  History of recurrent atrial fibrillation in setting of influenza and diverticulitis.  She was admitted December 2020 for septic shock and cardiac tamponade secondary to pericardiac effusion with subxiphoid window by Dr. Darcey Nora.  Effusion was culture negative and pathology showed only inflammation.  Started on amiodarone to suppress atrial fibrillation.  Admitted January 2021 with left ischium fracture.  Elevated troponin in the setting of sepsis and CT chest concerning for small airway disease versus pneumonitis.  Amiodarone was discontinued.  Echo with normal LVEF, no significant valvular disease.  Seen by Dr. Angelena Form 05/25/19, weight with no plans for ischemic evaluation.  Statin has been discontinued due to leg pain and abdominal discomfort.  Admitted February 2021 with sepsis secondary to C. Difficile noted recurrent afib with RVR converted with IV digoxin. Home Diltiazem increased to '240mg'$  QD. Seen 05/2019 with intermittent  dizziness and bradycardia. Diltiazem reduced to '180mg'$  QD and Lopressor '100mg'$  BID continued. Readmitted 06/19/19-06/27/19 due to healthcare acquired pneumonia. At follow up video visit 07/19/19 Diltiazem was consolidated to '240mg'$  daily.  Presents today for follow up for 4 dental extractions. Per pharmacy review she may hold Eliquis 1 day prior to procedure.  Her daughter is present with her for a clinic visit today.  She is residing in SNF.  She reports no chest pain, pressure, tightness.  Reports no shortness of breath at rest.  Endorses dyspnea on exertion which is stable at her baseline.  Mild lower extremity edema for which her primary at skilled has transitioned her to Lasix 60 mg daily with improvement.  She does sit with her legs down we discussed elevating as well as wearing compression stockings.  No orthopnea, PND.  She denies palpitations.  EKGs/Labs/Other Studies Reviewed:   The following studies were reviewed today:  04/17/19 Echo 1. Left ventricular ejection fraction, by visual estimation, is 60 to  65%. The left ventricle has normal function. There is mildly increased  left ventricular hypertrophy.   2. Left ventricular diastolic parameters are consistent with Grade II  diastolic dysfunction (pseudonormalization). The mean left atrial pressure  is high.   3. The left ventricle has no regional wall motion abnormalities.   4. Global right ventricle has normal systolic function.The right  ventricular size is normal. No increase in right ventricular wall  thickness.   5. Left atrial size was mildly dilated.   6. Right atrial size was mildly dilated.   7. Mild mitral annular calcification.   8. The mitral valve is normal in structure. Trivial mitral valve  regurgitation.   9. The tricuspid valve is normal in structure.  10. The tricuspid valve is normal in structure. Tricuspid valve  regurgitation is mild-moderate.  11. The aortic valve is normal in structure. Aortic valve  regurgitation is  trivial.  12. The pulmonic valve was not well visualized. Pulmonic valve  regurgitation is not visualized.  13. Mildly elevated pulmonary artery systolic pressure.  14. The tricuspid regurgitant velocity is 2.18 m/s, and with an assumed  right atrial pressure of 15 mmHg, the estimated right ventricular systolic  pressure is mildly elevated at 34.0 mmHg.  15. The inferior vena cava is dilated in size with <50% respiratory  variability, suggesting right atrial pressure of 15 mmHg.  16. There is exaggerated respiratory displacement of the interventricular  septum. This could represent contrictive physiology , but can also be seen  with increased work of breathing (e.g. COPD or asthma exacerbation).  63. Compared to 03/09/2019, left ventricular wall motion and overall  systolic function have improved.   EKG:  EKG is  ordered today.  The ekg ordered today demonstrates sinus bradycardia 56 bpm with 1st degree AV block PR 278m.   Recent Labs: 07/16/2020: ALT 25; B Natriuretic Peptide 96.2; BUN 23; Creatinine, Ser 0.95; Hemoglobin 14.3; Platelets 208; Potassium 4.7; Sodium 136 09/25/2020: TSH 1.680  Recent Lipid Panel    Component Value Date/Time   CHOL 171 11/14/2018 1420   CHOL 155 08/18/2017 1241   TRIG 168.0 (H) 11/14/2018 1420   HDL 63.40 11/14/2018 1420   HDL 78 08/18/2017 1241   CHOLHDL 3 11/14/2018 1420   VLDL 33.6 11/14/2018 1420   LDLCALC 74 11/14/2018 1420   LDLCALC 51 08/18/2017 1241   Home Medications   No outpatient medications have been marked as taking for the 11/14/20 encounter (Appointment) with WLoel Dubonnet NP.     Review of Systems      All other systems reviewed and are otherwise negative except as noted above.  Physical Exam    VS:  There were no vitals taken for this visit. , BMI There is no height or weight on file to calculate BMI.  Wt Readings from Last 3 Encounters:  09/25/20 157 lb (71.2 kg)  07/14/20 160 lb 0.9 oz (72.6 kg)   05/24/20 158 lb (71.7 kg)     GEN: Well nourished, well developed, in no acute distress. HEENT: normal. Neck: Supple, no JVD, carotid bruits, or masses. Cardiac: RRR, no murmurs, rubs, or gallops. No clubbing, cyanosis, edema.  Radials/PT 2+ and equal bilaterally.  Respiratory:  Respirations regular and unlabored, clear to auscultation bilaterally. GI: Soft, nontender, nondistended. MS: No deformity or atrophy. Skin: Warm and dry, no rash. Neuro:  Strength and sensation are intact. Psych: Normal affect.  Assessment & Plan    Preop clearance - Per pharmacy may hold anticoagulant 1 day prior to procedure. She is deemed acceptable risk for dental extractions  and may proceed without additional cardiovascular testing. Provided paper copy of clearance to daughter. She is planning to use Dr. Virgel Manifold  PAF -bradycardia by EKG today.  Continue metoprolol 100 mg daily.  Reports occasional lightheadedness with position changes which is likely reflective of orthostasis.  We discussed possibly reducing dose of metoprolol though as symptoms are infrequent she prefers to continue at present dose.  Continue Eliquis 5 mg twice daily.  She denies bleeding complications.  She does not meet dose reduction criteria.  CAD s/p stenting in 2005 to RCA - patent stent by cath 2014. Stress test 08/2017 low risk. Stable with no anginal symptoms. No indication for ischemic evaluation.  GDMT includes beta-blocker, statin.  No aspirin due to chronic anticoagulation.  HFpEF -trace bilateral pedal edema.  Continue in view Lasix 60 mg daily per provider at skilled nursing facility.  Recommend elevating lower extremities when sitting and compression stockings.  Low-salt diet, fluid restriction less than 2 L encouraged.  HLD - Continue Atorvastatin '80mg'$  daily.   Near syncope-no recurrence.  Does note occasional lightheadedness with quick position changes.  We reviewed orthostatic precautions.  Disposition: Follow up in 6  month(s) with Dr. Angelena Form or APP.  Signed, Loel Dubonnet, NP 11/14/2020, 11:24 AM [place]

## 2020-11-14 NOTE — Patient Instructions (Signed)
Medication Instructions:  Continue your current medications.   You may hold Eliquis one day prior to dental procedure.   *If you need a refill on your cardiac medications before your next appointment, please call your pharmacy*   Lab Work: None ordered today   Testing/Procedures: Your EKG today showed sinus bradycardia which is a regular but slightly slow heart rate.   Follow-Up: At [facility], you and your health needs are our priority.  As part of our continuing mission to provide you with exceptional heart care, we have created designated Provider Care Teams.  These Care Teams include your primary Cardiologist (physician) and Advanced Practice Providers (APPs -  Physician Assistants and Nurse Practitioners) who all work together to provide you with the care you need, when you need it.  We recommend signing up for the patient portal called "MyChart".  Sign up information is provided on this After Visit Summary.  MyChart is used to connect with patients for Virtual Visits (Telemedicine).  Patients are able to view lab/test results, encounter notes, upcoming appointments, etc.  Non-urgent messages can be sent to your provider as well.   To learn more about what you can do with MyChart, go to [email or web address].    Your next appointment:   6 month(s)  The format for your next appointment:   In Person  Provider:   You may see [name], MD or one of the following Advanced Practice Providers on your designated Care Team:   Melina Copa, PA-C Ermalinda Barrios, PA-C   Other Instructions  To prevent or reduce lower extremity swelling: Eat a low salt diet. Salt makes the body hold onto extra fluid which causes swelling. Sit with legs elevated. For example, in the recliner or on an Saline.  Wear knee-high compression stockings during the daytime. Ones labeled 15-20 mmHg provide good compression.

## 2020-11-14 NOTE — Telephone Encounter (Signed)
Called the dental office and they were closed for day at the time. Left a voice message

## 2020-11-14 NOTE — Telephone Encounter (Signed)
    Patient Name: Betty Jackson  DOB: Dec 10, 1934 MRN: [id number]  Primary Cardiologist: [name], MD  Chart reviewed as part of pre-operative protocol coverage. Given past medical history and time since last visit, based on ACC/AHA guidelines, [place] would be at acceptable risk for the planned procedure without further cardiovascular testing.   May hold Eliquis for 1 day prior to planned procedure.   Of note, patient plans to use Dr. Virgel Manifold at [facility] (phone [phone]). I have called and left VM to request their fax number.   The patient was advised that if she develops new symptoms prior to surgery to contact our office to arrange for a follow-up visit, and she verbalized understanding.  I will route this recommendation to preop call back pool for assistance in sending once fax number received.  Loel Dubonnet, NP 11/14/2020, 4:45 PM

## 2020-11-15 NOTE — Telephone Encounter (Signed)
Called Dr Audie Box office and they states that pt has not been seen there yet, she is a new patient. I will forward clearance to them as requested by patient. See fax number below Dr. Virgel Manifold at [facility] (phone [phone]) fax [phone].

## 2020-11-15 NOTE — Telephone Encounter (Signed)
Called Betty Jackson at [place] she states that their fax number is [phone]. Will forward to their office again.

## 2020-12-11 IMAGING — MR MR LUMBAR SPINE W/O CM
4 of 5 series · 29 of 48 positions shown · non-contrast
Comparison: Comparison made with recent MRIs from 04/16/2019.

CLINICAL DATA: Initial evaluation for acute on chronic mid and
lower back pain. Currently admitted with sepsis, presumed related to
TUELO, although evaluate for possible intraspinal infection.

EXAM:
MRI THORACIC AND LUMBAR SPINE WITHOUT CONTRAST
TECHNIQUE: Multiplanar and multiecho pulse sequences of the thoracic and lumbar
spine were obtained without intravenous contrast.

[Series 5: T1 · sagittal · 4.0mm · 1.02mm/px · 7 of 17 slices shown (1 of 2)]
[im 1/17]
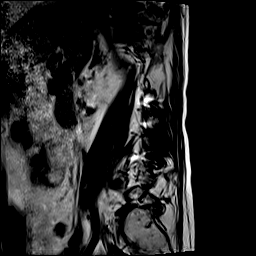
[im 3/17]
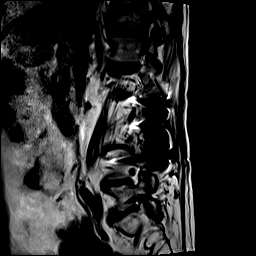
[im 6/17]
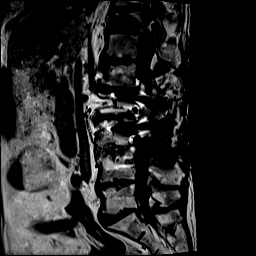
[im 9/17]
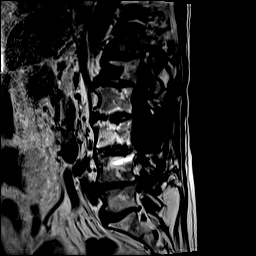
[im 11/17]
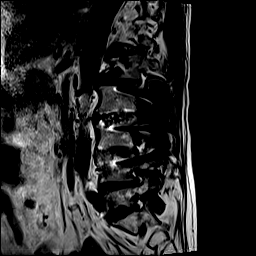
[im 14/17]
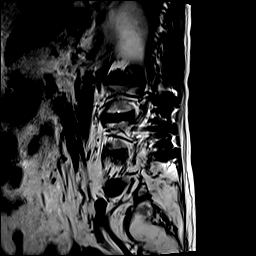
[im 17/17]
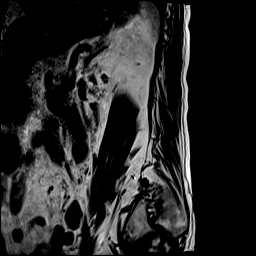

[Series 6: T2 · sagittal · 4.0mm · 1.02mm/px · 7 of 17 slices shown (1 of 2)]
[im 1/17]
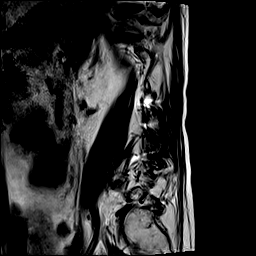
[im 3/17]
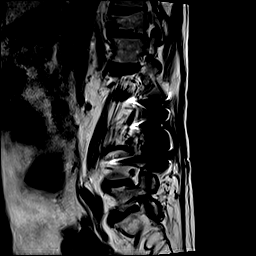
[im 6/17]
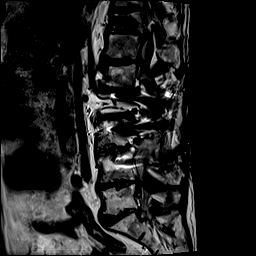
[im 9/17]
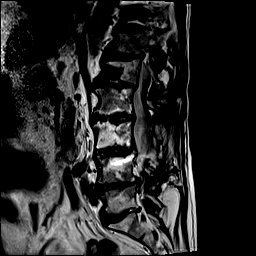
[im 11/17]
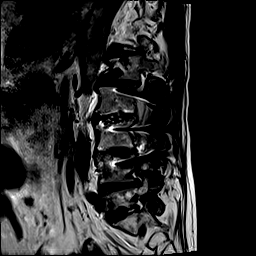
[im 14/17]
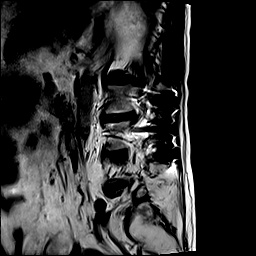
[im 17/17]
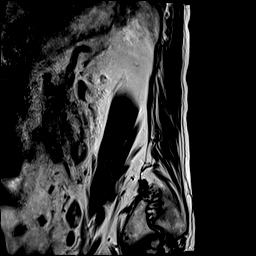

[Series 8: T2 · axial · 4.0mm · 0.78mm/px · z∈[+53,+238]mm · 8 of 38 slices shown (2 of 2)]
[im 1/38]
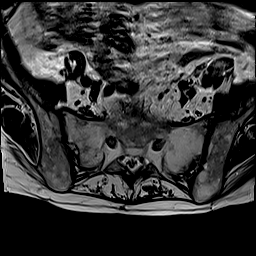
[im 6/38]
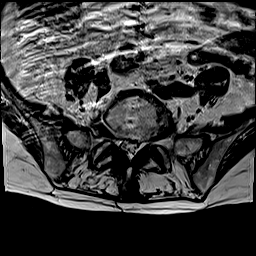
[im 12/38]
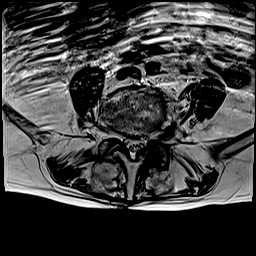
[im 18/38]
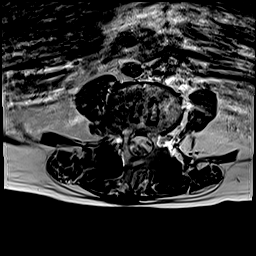
[im 20/38]
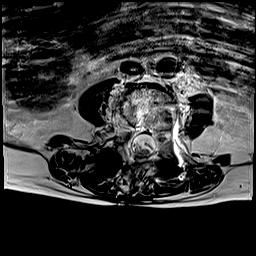
[im 26/38]
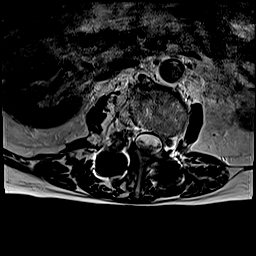
[im 32/38]
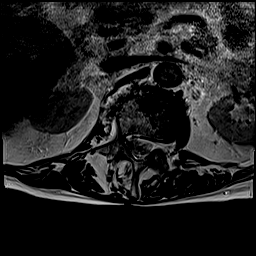
[im 38/38]
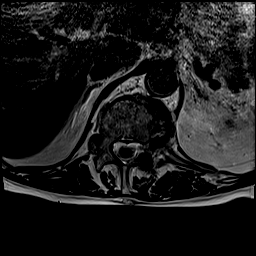

[Series 9: T1 · axial · 4.0mm · 0.43mm/px · z∈[+53,+208]mm · 7 of 38 slices shown (2 of 2)]
[im 1/38]
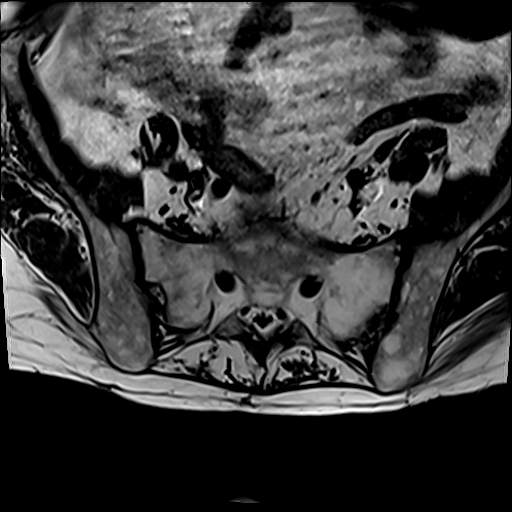
[im 6/38]
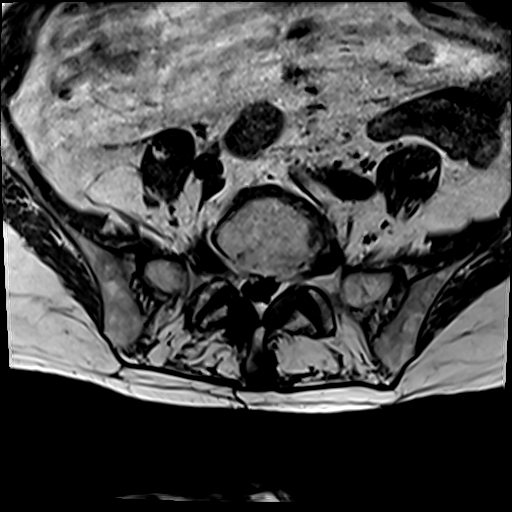
[im 12/38]
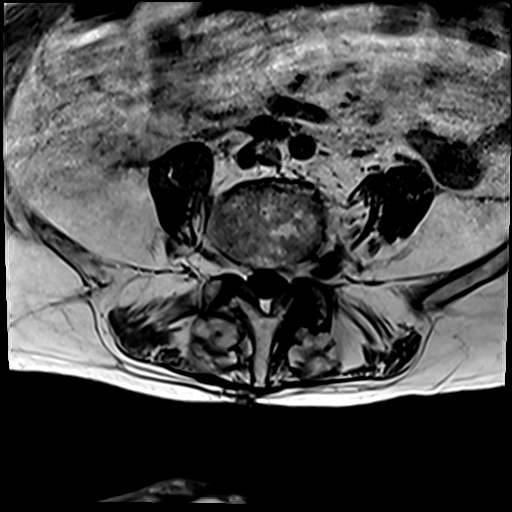
[im 18/38]
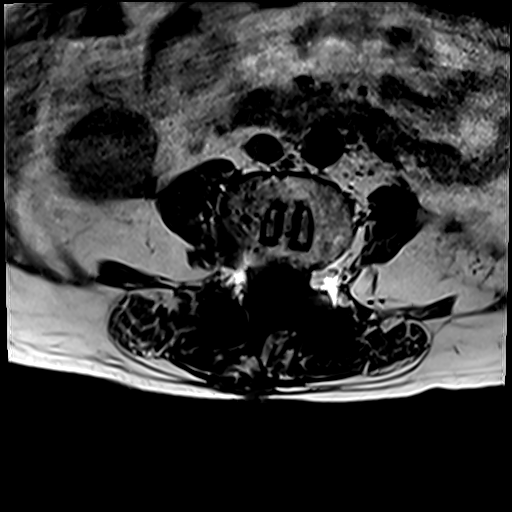
[im 20/38]
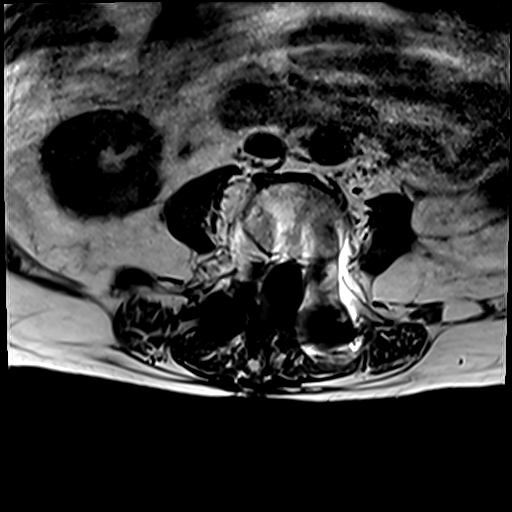
[im 26/38]
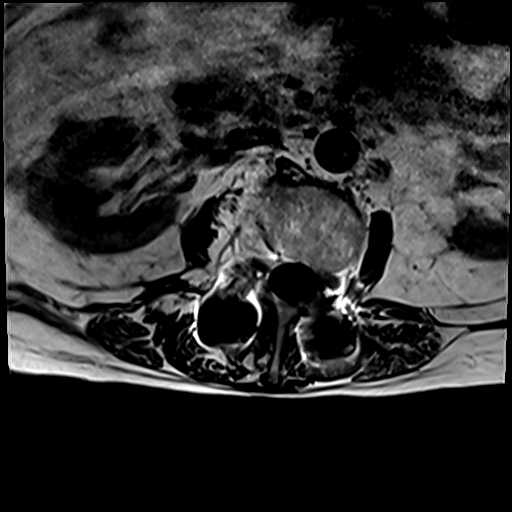
[im 32/38]
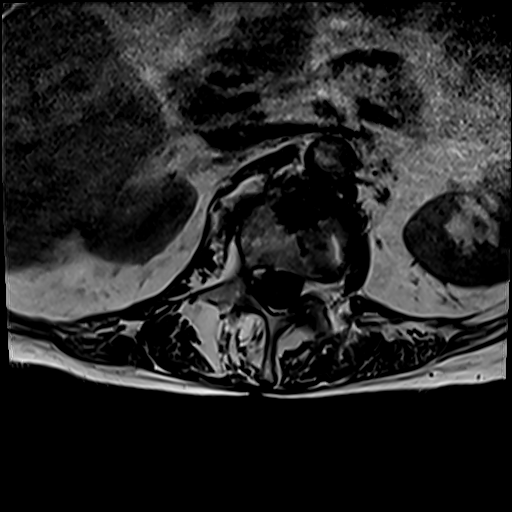

[29 of 48 positions shown; findings below may reference images not displayed]

FINDINGS: MRI THORACIC SPINE FINDINGS

Alignment: Stable alignment with trace anterolisthesis of T2 on T3
and T3 on T4. 3 mm retrolisthesis of T11 on T12 also unchanged. No
interval subluxation or malalignment.

Vertebrae: Chronic T11 and L1 compression fractures with sequelae of
prior vertebral augmentation again noted. Vertebral body height
otherwise maintained without evidence for acute or interval
fracture. Bone marrow signal intensity diffusely heterogeneous but
within normal limits. Multiple scattered benign hemangiomata noted.
No worrisome osseous lesions. No abnormal marrow edema to suggest
acute osteomyelitis or discitis.

Cord: No definite cord signal abnormality seen on today's exam.
Previously question signal changes at T10-11 and T11-12 related to
compressive stenosis not definitely seen. Signal intensity is normal
elsewhere within the thoracic spinal cord.

Paraspinal and other soft tissues: Paraspinous soft tissues within
normal limits. Moderate layering left pleural effusion noted.
Partially visualized right lung is largely clear.

Disc levels:

Multilevel degenerative changes noted within the cervical spine on
counter sequence without high-grade stenosis.

T1-2: Mild disc bulge. Left greater than right facet hypertrophy. No
stenosis.

T2-3: Mild disc bulge with superimposed small right paracentral disc
protrusion (series 21, image 9). Moderate facet hypertrophy. No
significant stenosis.

T3-4: Trace anterolisthesis. Diffuse disc bulge, eccentric to the
left. Posterior element hypertrophy. No significant spinal stenosis.
Foramina remain patent.

T4-5: Disc desiccation without disc bulge. Mild facet hypertrophy.
No significant stenosis.

T5-6: Mild disc bulge. Posterior element hypertrophy. No stenosis.

T6-7: No significant disc bulge. Mild facet hypertrophy with
prominence of the dorsal epidural fat. No stenosis.

T7-8: Minimal disc bulge. Mild facet hypertrophy with prominence of
the dorsal epidural fat. No stenosis.

T8-9: No significant disc bulge. Mild facet hypertrophy with
prominence of the dorsal epidural fat. No stenosis.

T9-10: Right paracentral to foraminal disc protrusion (series 21,
image 30). Mild facet hypertrophy. No significant stenosis.

T10-11: Diffuse disc bulge with intervertebral disc space narrowing.
Endplate Schmorl's node deformity with marginal endplate osteophytic
spurring. Disc bulging eccentric to the left. Moderate facet
hypertrophy. Resultant moderate spinal stenosis with mild cord
flattening. Again, no definite cord signal changes. Moderate left
with mild right foraminal narrowing.

T11-12: Retrolisthesis. Chronic intervertebral disc space narrowing
with diffuse disc bulge and disc desiccation. Reactive endplate
changes with marginal endplate spurring. Bilateral facet hypertrophy
with prominence of the dorsal epidural fat. Resultant moderate to
severe spinal stenosis with mild cord flattening. No definite cord
signal changes. Severe left foraminal narrowing. Right neural
foramen remains patent.

T12-L1: Diffuse disc bulge with disc desiccation. Reactive endplate
changes. Moderate bilateral facet hypertrophy. Mild prominence of
the dorsal epidural fat. Resultant moderate spinal stenosis.
Moderate left foraminal narrowing. No significant right foraminal
encroachment.

MRI LUMBAR SPINE FINDINGS

Segmentation: Standard. Lowest well-formed disc space labeled the
L5-S1 level.

Alignment: Levoscoliosis. 3 mm retrolisthesis of L1 on L2, with
trace anterolisthesis of L3 on L4 and L4 on L5. 4 mm retrolisthesis
of L5 on S1. Appearance is stable.

Vertebrae: Susceptibility artifact from prior PLIF at L2-3 and L3-4
again seen. Chronic L1 compression deformity with sequelae of prior
vertebral augmentation noted. Vertebral body height maintained
without evidence for acute or interval fracture. Diffuse
heterogeneity of the underlying bone marrow signal intensity without
worrisome osseous lesion. No abnormal marrow edema to suggest acute
osteomyelitis discitis or septic arthritis.

Conus medullaris and cauda equina: Conus extends to the L1-2 level.
Conus and cauda equina appear normal.

Paraspinal and other soft tissues: Chronic postoperative changes
present within the posterior paraspinous soft tissues. Few
subcentimeter cysts noted within the left kidney. Visualized
visceral structures otherwise unremarkable.

Disc levels:

L1-2: Retrolisthesis. Chronic intervertebral disc space narrowing
with diffuse disc bulge and disc desiccation. Disc bulging eccentric
to the right with prominent right-sided reactive endplate changes.
Moderate facet hypertrophy. Resultant mild canal with mild to
moderate right lateral recess stenosis. Mild to moderate right
foraminal narrowing. Left foramen widely patent.

L2-3: Prior PLIF. No residual spinal stenosis. Foramina are patent.

L3-4: Prior PLIF. No residual spinal stenosis. Foramina are patent.

L4-5: Trace anterolisthesis. Chronic intervertebral disc space
narrowing with diffuse disc bulge and disc desiccation. Disc bulge
slightly eccentric to the left. Superimposed reactive endplate
changes. There is a small right subarticular disc extrusion with
inferior migration (series 6, image 6), similar to previous.
Superimposed moderate facet and ligament flavum hypertrophy.
Resultant moderate to severe canal with right worse than left
lateral recess stenosis. Moderate left L4 foraminal narrowing.

L5-S1: Retrolisthesis. Chronic intervertebral disc space narrowing
with diffuse disc bulge and disc desiccation. Reactive endplate
changes. Moderate to advanced facet and ligament flavum hypertrophy.
Suspected changes from prior micro discectomy on the right.
Superimposed epidural lipomatosis. Resultant moderate canal with
right worse than left lateral recess stenosis, with mild to moderate
right worse than left L5 foraminal narrowing.
IMPRESSION: MR THORACIC SPINE IMPRESSION

1. Stable appearance of the thoracic spine as compared to
04/16/2019. No acute abnormality or evidence for intraspinal
infection.
2. Multifactorial degenerative changes at T10-11 through T12-L1 with
resultant moderate to severe diffuse spinal stenosis, stable. No
appreciable cord signal changes on today's exam.
3. Moderate to severe left foraminal stenosis at T11-12 through
T12-L1, also unchanged.
4. Moderate left pleural effusion.

MR LUMBAR SPINE IMPRESSION

1. Stable appearance of the lumbar spine as compared to 04/16/2019.
No acute abnormality or evidence for intraspinal infection.
2. Multifactorial degenerative changes at L4-5 and L5-S1 with
resultant moderate to severe canal and bilateral lateral recess
stenosis as above.
3. Moderate left L4 and bilateral L5 foraminal narrowing, stable
from previous.
4. Prior PLIF at L2-3 and L3-4 without residual stenosis.

## 2020-12-11 IMAGING — MR MR PELVIS W/O CM
8 series · 48 of 48 positions shown · non-contrast
Comparison: April 17, 2019

CLINICAL DATA: Acute I have pain, chronic buttocks pain limping

EXAM:
MRI PELVIS WITHOUT CONTRAST
TECHNIQUE: Multiplanar multisequence MR imaging of the pelvis was performed. No
intravenous contrast was administered.

[Series 7: T1 · coronal · 5.0mm · 1.48mm/px · 4 of 25 slices shown (1 of 2)]
[im 1/25]
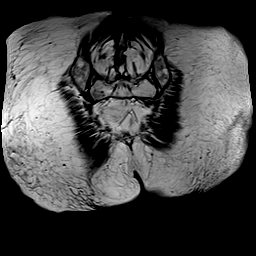
[im 9/25]
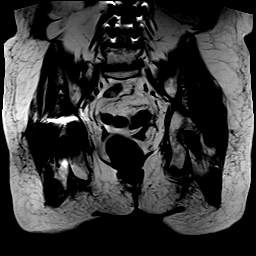
[im 17/25]
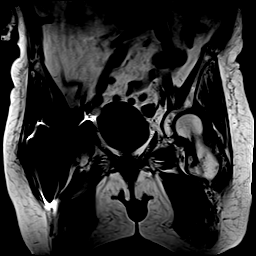
[im 25/25]
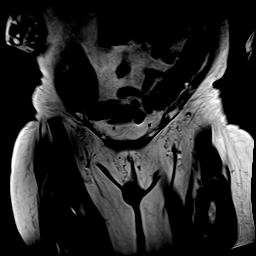

[Series 8: STIR · coronal · 5.0mm · 1.48mm/px · 3 of 24 slices shown (1 of 2)]
[im 1/24]
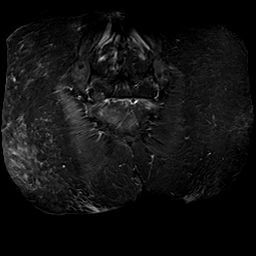
[im 12/24]
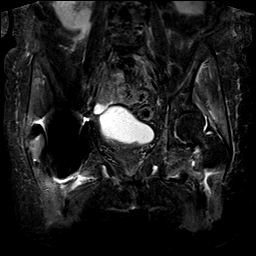
[im 24/24]
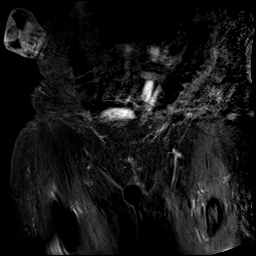

[Series 9: T2 · sagittal · 5.5mm · 1.29mm/px · 6 of 43 slices shown]
[im 1/43]
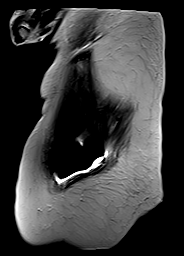
[im 9/43]
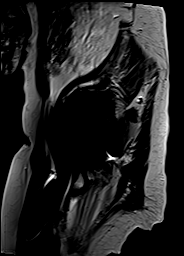
[im 17/43]
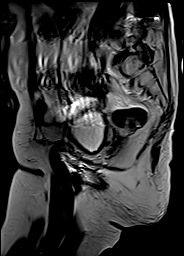
[im 26/43]
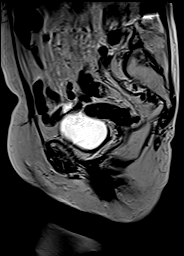
[im 34/43]
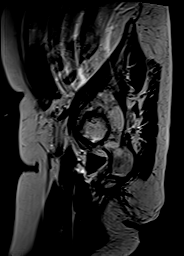
[im 43/43]
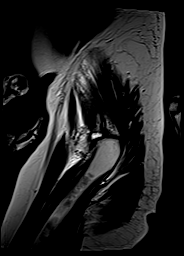

[Series 10: STIR · sagittal · 5.5mm · 1.48mm/px · 6 of 43 slices shown (2 of 2)]
[im 1/43]
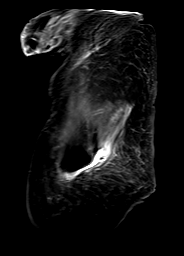
[im 9/43]
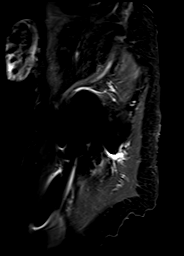
[im 17/43]
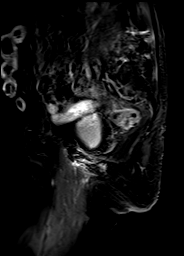
[im 26/43]
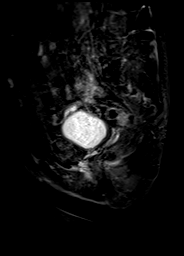
[im 34/43]
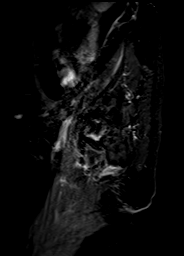
[im 43/43]
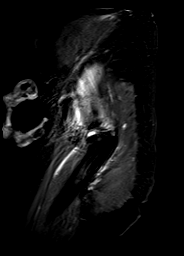

[Series 11: T1 · axial · 5.0mm · 1.48mm/px · z∈[-97,+143]mm · 5 of 41 slices shown (2 of 2)]
[im 1/41]
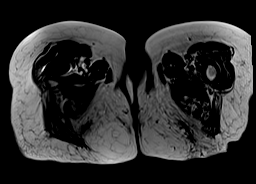
[im 11/41]
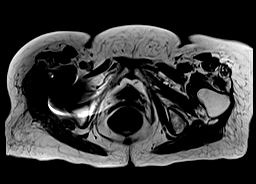
[im 21/41]
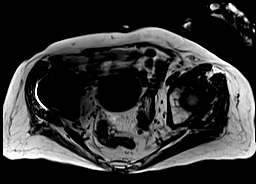
[im 31/41]
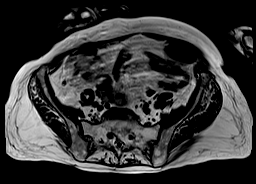
[im 41/41]
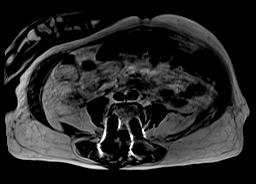

[Series 12: T1 fat-sat · axial · non-contrast · 5.0mm · 1.41mm/px · z∈[-97,+143]mm · 5 of 41 slices shown]
[im 1/41]
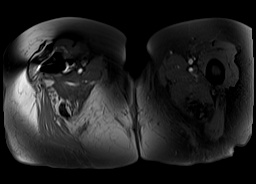
[im 11/41]
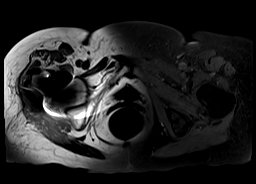
[im 21/41]
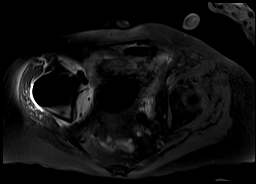
[im 31/41]
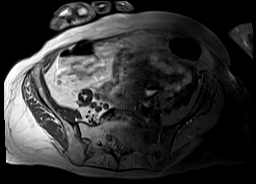
[im 41/41]
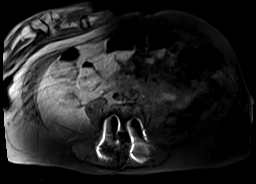

[Series 13: DWI · axial · 5.0mm · 1.16mm/px · z∈[-97,+143]mm · 14 of 110 slices shown (1 of 2)]
[im 1/110]
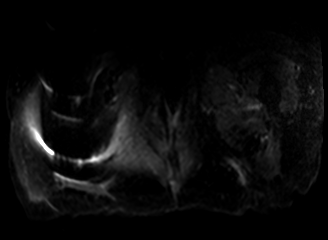
[im 9/110]
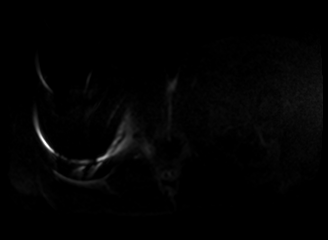
[im 17/110]
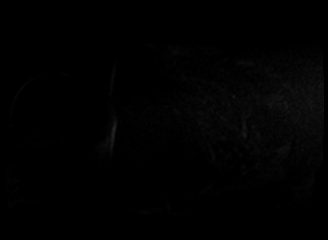
[im 26/110]
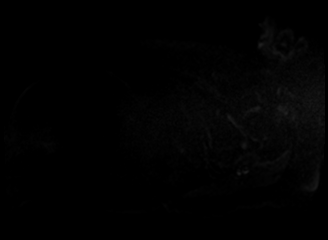
[im 34/110]
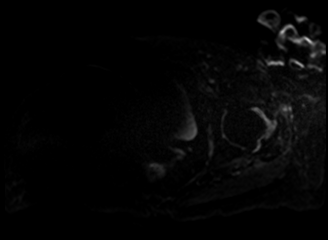
[im 42/110]
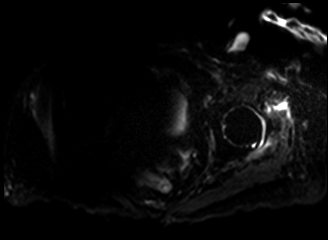
[im 51/110]
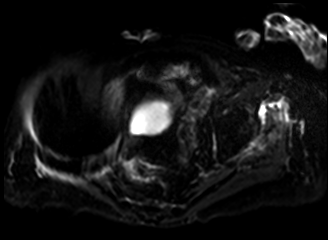
[im 59/110]
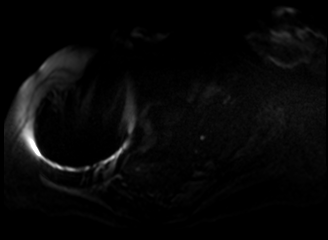
[im 68/110]
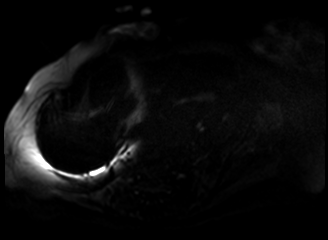
[im 76/110]
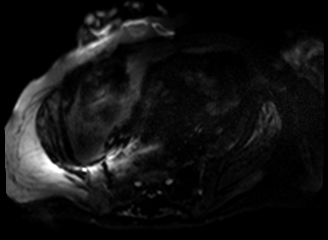
[im 84/110]
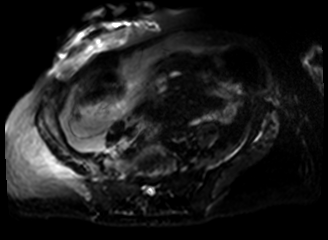
[im 93/110]
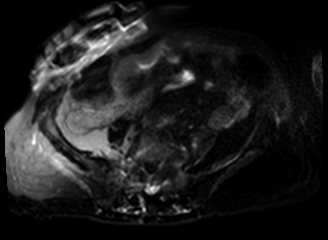
[im 101/110]
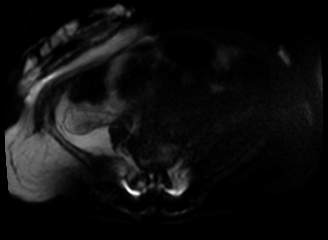
[im 110/110]
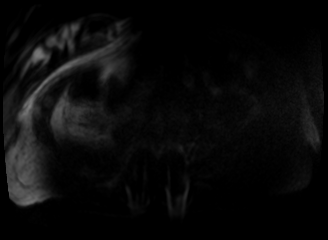

[Series 14: DWI · axial · 5.0mm · 1.16mm/px · z∈[-97,+143]mm · 5 of 41 slices shown (2 of 2)]
[im 1/41]
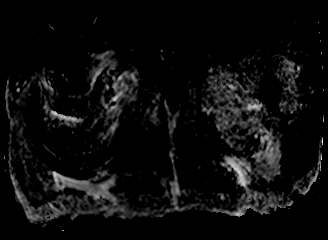
[im 11/41]
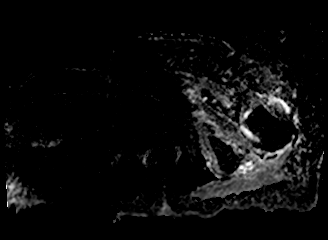
[im 21/41]
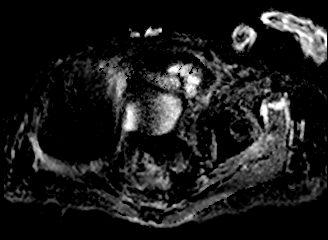
[im 31/41]
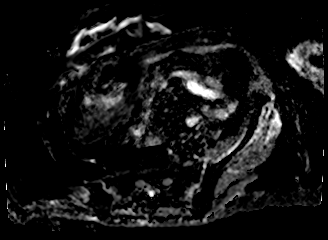
[im 41/41]
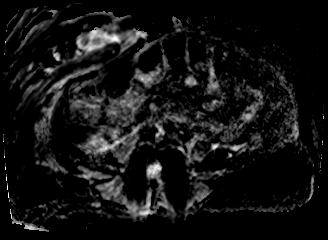

[48 of 48 positions shown; findings below may reference images not displayed]

FINDINGS: Urinary Tract: The visualized distal ureters and bladder appear
unremarkable.

Bowel: No bowel wall thickening, distention or surrounding
inflammation identified within the pelvis. Scattered colonic
diverticula are noted.

Vascular/Lymphatic: No enlarged pelvic lymph nodes identified. No
significant vascular findings.

Reproductive: The patient is status post hysterectomy. No adnexal
abnormality is noted.

Other: No focal soft tissue mass or soft tissue swelling is seen.

Musculoskeletal: Again noted is a nonunited avulsion injury off the
left ischial tuberosity. No significant surrounding marrow edema
seen. No new osseous fracture is identified. The patient is status
post right total hip arthroplasty with surrounding metallic
artifact. There is a small left hip joint effusion seen. There is a
partial tear seen of the left gluteal medius and minimus tendons at
the insertion site. There is feathery signal and fluid seen
surrounding the gluteus minimus and medius myotendinous junction.
There is also mildly increased feathery signal seen in the bilateral
adductor musculature, left greater than right. There is mild fatty
atrophy of the muscles surrounding the pelvis. The remainder of the
visualized portions of the tendons appear to be intact. There is
moderate left hip osteoarthritis with superior joint space loss and
diffuse chondral thinning.
IMPRESSION: 1. Nonunited left ischial tuberosity avulsion fracture as seen on
prior exam. No new acute osseous injury seen.
2. partial insertional tear of the left gluteus minimus and medius
tendons with myotendinous muscular high-grade strain.
3. Bilateral, left greater than right adductor muscular
edema/strain.
4. Diffuse muscular atrophy surrounding the pelvis.

## 2021-03-14 ENCOUNTER — Inpatient Hospital Stay (HOSPITAL_COMMUNITY)
Admission: EM | Admit: 2021-03-14 | Discharge: 2021-03-19 | DRG: 092 | Disposition: A | Discharge status: Skilled Nursing Facility | Payer: Medicare Other | Source: Skilled Nursing Facility | Attending: Family Medicine | Admitting: Family Medicine

## 2021-03-14 ENCOUNTER — Emergency Department (HOSPITAL_COMMUNITY): Payer: Medicare Other

## 2021-03-14 DIAGNOSIS — M316 Other giant cell arteritis: Secondary | ICD-10-CM | POA: Diagnosis present

## 2021-03-14 DIAGNOSIS — R0902 Hypoxemia: Secondary | ICD-10-CM | POA: Diagnosis present

## 2021-03-14 DIAGNOSIS — R627 Adult failure to thrive: Secondary | ICD-10-CM | POA: Diagnosis present

## 2021-03-14 DIAGNOSIS — M810 Age-related osteoporosis without current pathological fracture: Secondary | ICD-10-CM | POA: Diagnosis present

## 2021-03-14 DIAGNOSIS — I10 Essential (primary) hypertension: Secondary | ICD-10-CM | POA: Diagnosis present

## 2021-03-14 DIAGNOSIS — D649 Anemia, unspecified: Secondary | ICD-10-CM | POA: Diagnosis present

## 2021-03-14 DIAGNOSIS — K219 Gastro-esophageal reflux disease without esophagitis: Secondary | ICD-10-CM | POA: Diagnosis present

## 2021-03-14 DIAGNOSIS — Z7952 Long term (current) use of systemic steroids: Secondary | ICD-10-CM

## 2021-03-14 DIAGNOSIS — R4182 Altered mental status, unspecified: Secondary | ICD-10-CM | POA: Diagnosis not present

## 2021-03-14 DIAGNOSIS — E785 Hyperlipidemia, unspecified: Secondary | ICD-10-CM | POA: Diagnosis present

## 2021-03-14 DIAGNOSIS — I130 Hypertensive heart and chronic kidney disease with heart failure and stage 1 through stage 4 chronic kidney disease, or unspecified chronic kidney disease: Secondary | ICD-10-CM | POA: Diagnosis present

## 2021-03-14 DIAGNOSIS — Z7983 Long term (current) use of bisphosphonates: Secondary | ICD-10-CM

## 2021-03-14 DIAGNOSIS — Z993 Dependence on wheelchair: Secondary | ICD-10-CM

## 2021-03-14 DIAGNOSIS — N1831 Chronic kidney disease, stage 3a: Secondary | ICD-10-CM | POA: Diagnosis present

## 2021-03-14 DIAGNOSIS — F411 Generalized anxiety disorder: Secondary | ICD-10-CM | POA: Diagnosis present

## 2021-03-14 DIAGNOSIS — Z808 Family history of malignant neoplasm of other organs or systems: Secondary | ICD-10-CM

## 2021-03-14 DIAGNOSIS — L89612 Pressure ulcer of right heel, stage 2: Secondary | ICD-10-CM | POA: Diagnosis present

## 2021-03-14 DIAGNOSIS — T43595A Adverse effect of other antipsychotics and neuroleptics, initial encounter: Secondary | ICD-10-CM | POA: Diagnosis present

## 2021-03-14 DIAGNOSIS — Z7901 Long term (current) use of anticoagulants: Secondary | ICD-10-CM

## 2021-03-14 DIAGNOSIS — I480 Paroxysmal atrial fibrillation: Secondary | ICD-10-CM | POA: Diagnosis present

## 2021-03-14 DIAGNOSIS — Z79891 Long term (current) use of opiate analgesic: Secondary | ICD-10-CM

## 2021-03-14 DIAGNOSIS — R112 Nausea with vomiting, unspecified: Secondary | ICD-10-CM

## 2021-03-14 DIAGNOSIS — Z20822 Contact with and (suspected) exposure to covid-19: Secondary | ICD-10-CM | POA: Diagnosis present

## 2021-03-14 DIAGNOSIS — N179 Acute kidney failure, unspecified: Secondary | ICD-10-CM

## 2021-03-14 DIAGNOSIS — F05 Delirium due to known physiological condition: Secondary | ICD-10-CM | POA: Diagnosis present

## 2021-03-14 DIAGNOSIS — Z8249 Family history of ischemic heart disease and other diseases of the circulatory system: Secondary | ICD-10-CM

## 2021-03-14 DIAGNOSIS — Z66 Do not resuscitate: Secondary | ICD-10-CM | POA: Diagnosis present

## 2021-03-14 DIAGNOSIS — Z90710 Acquired absence of both cervix and uterus: Secondary | ICD-10-CM

## 2021-03-14 DIAGNOSIS — G894 Chronic pain syndrome: Secondary | ICD-10-CM | POA: Diagnosis present

## 2021-03-14 DIAGNOSIS — T40415A Adverse effect of fentanyl or fentanyl analogs, initial encounter: Secondary | ICD-10-CM | POA: Diagnosis present

## 2021-03-14 DIAGNOSIS — G928 Other toxic encephalopathy: Principal | ICD-10-CM | POA: Diagnosis present

## 2021-03-14 DIAGNOSIS — I5032 Chronic diastolic (congestive) heart failure: Secondary | ICD-10-CM | POA: Diagnosis present

## 2021-03-14 DIAGNOSIS — Z803 Family history of malignant neoplasm of breast: Secondary | ICD-10-CM

## 2021-03-14 DIAGNOSIS — G9341 Metabolic encephalopathy: Secondary | ICD-10-CM

## 2021-03-14 DIAGNOSIS — Z79899 Other long term (current) drug therapy: Secondary | ICD-10-CM

## 2021-03-14 DIAGNOSIS — Z955 Presence of coronary angioplasty implant and graft: Secondary | ICD-10-CM

## 2021-03-14 DIAGNOSIS — Z8673 Personal history of transient ischemic attack (TIA), and cerebral infarction without residual deficits: Secondary | ICD-10-CM

## 2021-03-14 DIAGNOSIS — M069 Rheumatoid arthritis, unspecified: Secondary | ICD-10-CM | POA: Diagnosis present

## 2021-03-14 DIAGNOSIS — I2510 Atherosclerotic heart disease of native coronary artery without angina pectoris: Secondary | ICD-10-CM | POA: Diagnosis present

## 2021-03-14 DIAGNOSIS — T428X5A Adverse effect of antiparkinsonism drugs and other central muscle-tone depressants, initial encounter: Secondary | ICD-10-CM | POA: Diagnosis present

## 2021-03-14 LAB — CBC WITH DIFFERENTIAL/PLATELET
Abs Immature Granulocytes: 0.08 10*3/uL — ABNORMAL HIGH (ref 0.00–0.07)
Basophils Absolute: 0.1 10*3/uL (ref 0.0–0.1)
Basophils Relative: 0 %
Eosinophils Absolute: 0.1 10*3/uL (ref 0.0–0.5)
Eosinophils Relative: 1 %
HCT: 35.9 % — ABNORMAL LOW (ref 36.0–46.0)
Hemoglobin: 11.1 g/dL — ABNORMAL LOW (ref 12.0–15.0)
Immature Granulocytes: 1 %
Lymphocytes Relative: 10 %
Lymphs Abs: 1.3 10*3/uL (ref 0.7–4.0)
MCH: 30.5 pg (ref 26.0–34.0)
MCHC: 30.9 g/dL (ref 30.0–36.0)
MCV: 98.6 fL (ref 80.0–100.0)
Monocytes Absolute: 1.4 10*3/uL — ABNORMAL HIGH (ref 0.1–1.0)
Monocytes Relative: 11 %
Neutro Abs: 9.7 10*3/uL — ABNORMAL HIGH (ref 1.7–7.7)
Neutrophils Relative %: 77 %
Platelets: 194 10*3/uL (ref 150–400)
RBC: 3.64 MIL/uL — ABNORMAL LOW (ref 3.87–5.11)
RDW: 15.4 % (ref 11.5–15.5)
WBC: 12.6 10*3/uL — ABNORMAL HIGH (ref 4.0–10.5)
nRBC: 0 % (ref 0.0–0.2)

## 2021-03-14 LAB — COMPREHENSIVE METABOLIC PANEL
ALT: 17 U/L (ref 0–44)
AST: 24 U/L (ref 15–41)
Albumin: 3 g/dL — ABNORMAL LOW (ref 3.5–5.0)
Alkaline Phosphatase: 72 U/L (ref 38–126)
Anion gap: 10 (ref 5–15)
BUN: 17 mg/dL (ref 8–23)
CO2: 25 mmol/L (ref 22–32)
Calcium: 8.4 mg/dL — ABNORMAL LOW (ref 8.9–10.3)
Chloride: 102 mmol/L (ref 98–111)
Creatinine, Ser: 1.19 mg/dL — ABNORMAL HIGH (ref 0.44–1.00)
GFR, Estimated: 45 mL/min — ABNORMAL LOW (ref >60)
Glucose, Bld: 105 mg/dL — ABNORMAL HIGH (ref 70–99)
Potassium: 3.8 mmol/L (ref 3.5–5.1)
Sodium: 137 mmol/L (ref 135–145)
Total Bilirubin: 0.9 mg/dL (ref 0.3–1.2)
Total Protein: 5.7 g/dL — ABNORMAL LOW (ref 6.5–8.1)

## 2021-03-14 LAB — ETHANOL: Alcohol, Ethyl (B): <10 mg/dL (ref <10)

## 2021-03-14 LAB — CBG MONITORING, ED: Glucose-Capillary: 105 mg/dL — ABNORMAL HIGH (ref 70–99)

## 2021-03-14 LAB — AMMONIA: Ammonia: 13 umol/L (ref 9–35)

## 2021-03-14 LAB — RESP PANEL BY RT-PCR (FLU A&B, COVID) ARPGX2
Influenza A by PCR: NEGATIVE
Influenza B by PCR: NEGATIVE
SARS Coronavirus 2 by RT PCR: NEGATIVE

## 2021-03-14 LAB — LACTIC ACID, PLASMA: Lactic Acid, Venous: 1.5 mmol/L (ref 0.5–1.9)

## 2021-03-14 NOTE — ED Provider Notes (Signed)
[facility] EMERGENCY DEPARTMENT Provider Note   CSN: [id number] Arrival date & time: 03/14/21  2122     History Chief Complaint  Patient presents with   Altered Mental Status    Betty Jackson is a 86 y.o. female with a past medical history significant for CAD, anemia, chronic low back pain, CKD stage III, history of CVA, hyperlipidemia, hypertension, paroxysmal A. fib, history of temporal arteritis on chronic prednisone who presents to the ED from SNF due to altered mental status.  Daughter at bedside states patient has been not acting herself today.  Daughter notes the patient has been more fatigued and confused.  At baseline, patient typically can hold a conversation however, today she has been falling asleep mid conversation.  No recent illness.  Daughter is unsure whether or not she has been having malodorous urine.  Patient had a recent fall out of bed yesterday per daughter.  Per chart review, patient on chronic Eliquis.  No treatment prior to arrival.    Level 5 caveat secondary to altered mental status.  History obtained from patient, daughter and past medical records. No interpreter used during encounter.       Past Medical History:  Diagnosis Date   Acute respiratory failure with hypoxia ([place]) 05/20/2017   Anemia, unspecified 10/28/2012   Anxiety state, unspecified 10/28/2012   CAD (coronary artery disease)    a. Stent R[facility] in [facility];  b. Cath approx 2009 - nonobs per pt report.   Cataract    immature on the left eye   Chronic insomnia 02/07/2013   Chronic lower back pain    scoliosis   CKD (chronic kidney disease) stage 3, GFR 30-59 ml/min (HCC)    CVA (cerebral infarction) 10/29/2012   DDD (degenerative disc disease)    Diverticulosis    GERD (gastroesophageal reflux disease) 09/01/2010   Hemorrhoids    Herniated nucleus pulposus, L5-S1, right 11/04/2015   Hyperlipidemia    takes Lipitor daily   Hypertension    takes  Amlodipine,Losartan,Metoprolol,and HCTZ daily   Incontinence of urine    Insomnia    takes Restoril nightly   Lumbar stenosis 04/25/2013   Major depressive disorder, recurrent episode, moderate ([place]) 07/16/2013   Osteoporosis    PAF (paroxysmal atrial fibrillation) ([place]) 2011   a. lone epidode in 2011 according to notes.   Rheumatoid arthritis ([place])    Scoliosis    Sepsis ([name]) 05/2019   Stroke ([place])    Temporal arteritis ([place]) 2011   a. followed @ [facility]; potential flareup 10/28/2012/notes 10/28/2012   Vocal cord dysfunction    "they don't operate properly" (10/28/2012)    Patient Active Problem List   Diagnosis Date Noted   Stroke-like symptoms 07/14/2020   Cough 05/24/2020   Dyspnea 05/24/2020   Optic neuritis 05/24/2020   Osteopenia 05/24/2020   Rash 05/24/2020   Vitamin D deficiency 05/24/2020   Palliative care by specialist    Goals of care, counseling/discussion    DNR (do not resuscitate)    Weakness    Advanced directives, counseling/discussion    Pressure injury of skin 10/18/2019   Closed fracture of right distal femur ([place]) 10/17/2019   Femur fracture, right ([place]) 09/25/2019   Chronic diastolic CHF (congestive heart failure) ([place]) 09/25/2019   HCAP (healthcare-associated pneumonia) 06/19/2019   Encephalopathy 06/19/2019   Lumbar degenerative disc disease    Paroxysmal A-fib (HCC)    Hypomagnesemia    C. difficile colitis    Diarrhea 05/29/2019  CHF (congestive heart failure) ([place]) 05/29/2019   Recurrent falls 04/30/2019   Pericardial effusion without cardiac tamponade 03/29/2019   Hypoalbuminemia 03/27/2019   Cardiac tamponade    Pericarditis 03/06/2019   Fever    Septic shock (HCC)    Atypical chest pain    Atrial fibrillation with rapid ventricular response (HCC)    Nausea 01/17/2019   Acute diverticulitis 01/13/2019   Chronic back pain 01/13/2019   Tachycardia 01/13/2019   Hypokalemia 01/13/2019   Chronic low back pain 07/10/2018   Influenza A  05/20/2017   Severe sepsis ([place]) 05/20/2017   Acute respiratory failure with hypoxia ([place]) 05/20/2017   CKD (chronic kidney disease), stage III ([place]) 05/20/2017   PAF (paroxysmal atrial fibrillation) ([place]) 05/20/2017   HTN (hypertension) 05/20/2017   Giant cell arteritis ([place]) 05/20/2017   Coronary disease 05/20/2017   Abnormal urinalysis 05/20/2017   Osteoporosis 05/08/2016   Herniated nucleus pulposus, L5-S1, right 11/04/2015   Major depressive disorder, recurrent episode, moderate ([place]) 07/16/2013   Abdominal pain, epigastric 06/22/2013   Lumbar stenosis 04/25/2013   Chronic insomnia 02/07/2013   Elevated transaminase level 02/07/2013   CVA (cerebral infarction) 10/29/2012   Visual changes 10/28/2012   Depression 10/28/2012   Anxiety state 10/28/2012   Anemia, unspecified 10/28/2012   Nonspecific (abnormal) findings on radiological and other examination of gastrointestinal tract 01/25/2012   Hyponatremia 01/24/2012   Hematuria 01/24/2012   Enteritis 12/24/2011   Abdominal pain, other specified site 12/24/2011   Leg pain, right 02/18/2011   Rheumatoid arthritis ([place]) 02/04/2011   Temporal arteritis ([place]) 09/01/2010   Hypertension 09/01/2010   Hyperlipidemia 09/01/2010   History of atrial fibrillation 09/01/2010   CAD (coronary artery disease) 09/01/2010   GERD (gastroesophageal reflux disease) 09/01/2010   Insomnia 09/01/2010    Past Surgical History:  Procedure Laterality Date   ABDOMINAL HYSTERECTOMY  ~ 1984   vaginally   BACK SURGERY  7-65yrs ago   X Stop   BLADDER SUSPENSION  2001   BREAST BIOPSY Right    CATARACT EXTRACTION W/ INTRAOCULAR LENS IMPLANT Right ~ 08/2012   CHEST TUBE INSERTION Left 03/08/2019   Procedure: Chest Tube Insertion;  Surgeon: Ivin Poot, MD;  Location: [facility];  Service: Thoracic;  Laterality: Left;   COLONOSCOPY  01/26/2012   Procedure: COLONOSCOPY;  Surgeon: Ladene Artist, MD,FACG;  [facility] END[facility]COPY;  Service: Endoscopy;   Laterality: N/A;  note the EGD is possible   CORONARY ANGIOPLASTY WITH STENT PLACEMENT  2006   X 1 stent   EPIDURAL BLOCK INJECTION     ESOPHAGOGASTRODUODENOSCOPY  01/26/2012   Procedure: ESOPHAGOGASTRODUODENOSCOPY (EGD);  Surgeon: Ladene Artist, MD,FACG;  Location: [facility] ENDOSCOPY;  Service: Endoscopy;  Laterality: N/A;   FINE NEEDLE ASPIRATION Right 09/26/2019   Procedure: FINE NEEDLE ASPIRATION;  Surgeon: Altamese Fairplay, MD;  Location: [facility];  Service: Orthopedics;  Laterality: Right;   HEMIARTHROPLASTY HIP Right 2012   IR EPIDUROGRAPHY  04/20/2019   LAPAROSCOPIC CHOLECYSTECTOMY  2001   LUMBAR FUSION  03/2013   LUMBAR LAMINECTOMY/DECOMPRESSION MICRODISCECTOMY Right 11/04/2015   Procedure: Right Lumbar Five-Sacral One Microdiskectomy;  Surgeon: Kristeen Miss, MD;  Location: [facility] NEURO ORS;  Service: Neurosurgery;  Laterality: Right;  Right L5-S1 Microdiskectomy   moles removed that required stiches     one on leg and one on face   ORIF FEMUR FRACTURE Right 09/26/2019   Procedure: OPEN REDUCTION INTERNAL FIXATION (ORIF) DISTAL FEMUR FRACTURE;  Surgeon: Altamese [place], MD;  Location: [facility];  Service: Orthopedics;  Laterality: Right;   ORIF FEMUR FRACTURE Right 10/17/2019   Procedure: OPEN REDUCTION INTERNAL FIXATION (ORIF) DISTAL FEMUR FRACTURE;  Surgeon: Altamese [place], MD;  Location: [facility];  Service: Orthopedics;  Laterality: Right;   SUBXYPHOID PERICARDIAL WINDOW N/A 03/08/2019   Procedure: SUBXYPHOID PERICARDIAL WINDOW;  Surgeon: Ivin Poot, MD;  Location: [facility];  Service: Thoracic;  Laterality: N/A;   TEE WITHOUT CARDIOVERSION N/A 03/08/2019   Procedure: TRANSESOPHAGEAL ECHOCARDIOGRAM (TEE);  Surgeon: Prescott Gum, Collier [place]ina, MD;  Location: [facility];  Service: Thoracic;  Laterality: N/A;   TEMPORAL ARTERY BIOPSY / LIGATION Bilateral 2011   TONSILLECTOMY AND ADENOIDECTOMY     at age 39   [place]  ~ [place]  ~ 2010   "lower back" (10/28/2012)     OB History   No obstetric  history on file.     Family History  Problem Relation Age of Onset   Brain cancer Mother 16   Heart attack Father    Breast cancer Daughter 71   Heart disease Other    Hypertension Other    Arthritis Neg Hx    Colon cancer Neg Hx    Osteoporosis Neg Hx     Social History   Tobacco Use   Smoking status: Never   Smokeless tobacco: Never  Vaping Use   Vaping Use: Never used  Substance Use Topics   Alcohol use: Yes    Comment: socially   Drug use: No    Home Medications Prior to Admission medications   Medication Sig Start Date End Date Taking? Authorizing Provider  acetaminophen (TYLENOL) 650 MG CR tablet Take 1,300 mg by mouth daily as needed for pain.    [provider]  alendronate (FOSAMAX) 70 MG tablet Take 1 tablet (70 mg total) by mouth every 7 (seven) days. Take with a full glass of water on an empty stomach. 06/14/20   Burchette, Alinda Sierras, MD  Ascorbic Acid (VITAMIN C PO) Take 1 tablet by mouth daily with supper.    [provider]  atorvastatin (LIPITOR) 80 MG tablet Take 1 tablet (80 mg total) by mouth daily. 09/25/20   Garvin Fila, MD  Calcium Carbonate Antacid (TUMS PO) Take 1 tablet by mouth daily with supper.    [provider]  cholecalciferol (VITAMIN D) 25 MCG tablet Take 2 tablets (2,000 Units total) by mouth 2 (two) times daily. 09/29/19   Ainsley Spinner, PA-C  diltiazem (CARDIZEM CD) 240 MG 24 hr capsule Take 1 capsule (240 mg total) by mouth daily. 03/28/20   Burnell Blanks, MD  docusate sodium (COLACE) 100 MG capsule Take 100 mg by mouth daily as needed for mild constipation.    [provider]  ELIQUIS 5 MG TABS tablet TAKE 1 TABLET BY MOUTH TWICE A DAY 05/09/20   Burnell Blanks, MD  escitalopram (LEXAPRO) 10 MG tablet TAKE 1 TABLET BY MOUTH EVERY DAY 08/27/20   Burchette, Alinda Sierras, MD  fentaNYL (DURAGESIC) 25 MCG/HR Place 1 patch onto the skin every 3 (three) days. 06/14/20   Burchette, Alinda Sierras, MD  ferrous  sulfate 325 (65 FE) MG tablet Take 1 tablet (325 mg total) by mouth 2 (two) times daily with a meal. Patient not taking: Reported on 11/14/2020 04/21/19   Oswald Hillock, MD  furosemide (LASIX) 40 MG tablet Take 1 tablet (40 mg total) by mouth daily. Please keep upcoming Dr. appt for future refills. Thank you Patient taking differently: Take 60 mg  by mouth daily. Please keep upcoming Dr. appt for future refills. Thank you 11/01/20   Leanor Kail, PA  HYDROcodone-acetaminophen (NORCO) 10-325 MG tablet Take one tablet by mouth every 8 hours as needed for breakthrough pain. 08/14/20   Burchette, Alinda Sierras, MD  LACTOBACILLUS PO Take 1 capsule by mouth daily with supper.    [provider]  losartan (COZAAR) 50 MG tablet Take 1 tablet (50 mg total) by mouth daily. 05/24/20   Burchette, Alinda Sierras, MD  MAGNESIUM PO Take 1 tablet by mouth 2 (two) times a week. Patient not taking: Reported on 11/14/2020    [provider]  meloxicam (MOBIC) 7.5 MG tablet Take 7.5 mg by mouth daily.    [provider]  metoprolol tartrate (LOPRESSOR) 100 MG tablet TAKE 1 TABLET BY MOUTH TWICE A DAY 08/28/20   Burnell Blanks, MD  Multiple Vitamin (MULTIVITAMIN WITH MINERALS) TABS tablet Take 1 tablet by mouth daily. 10/21/19   Ainsley Spinner, PA-C  potassium chloride (KLOR-CON) 10 MEQ tablet Take 1 tablet (10 mEq total) by mouth daily. 06/07/20   Burchette, Alinda Sierras, MD  predniSONE (DELTASONE) 10 MG tablet Take 1 tablet (10 mg total) by mouth daily with breakfast. 10/30/19   Charlynne Cousins, MD  QUEtiapine (SEROQUEL) 25 MG tablet TAKE 1 TO 2 TABLETS BY MOUTH AT NIGHT AS NEEDED FOR INSOMNIA AND AGITATION 09/05/20   Burchette, Alinda Sierras, MD  senna-docusate (SENOKOT-S) 8.6-50 MG tablet Take 1 tablet by mouth every other day.    [provider]  VITAMIN E PO Take 1 capsule by mouth daily with supper. Patient not taking: Reported on 11/14/2020    [provider]    Allergies     Dextromethorphan  Review of Systems   Review of Systems  Physical Exam Updated Vital Signs BP (!) 135/52    Pulse 68    Temp 98.9 F (37.2 C) (Oral)    Resp (!) 23    SpO2 99%   Physical Exam Vitals and nursing note reviewed.  Constitutional:      General: She is not in acute distress.    Appearance: She is not ill-appearing.  HENT:     Head: Normocephalic.  Eyes:     Pupils: Pupils are equal, round, and reactive to light.  Cardiovascular:     Rate and Rhythm: Normal rate and regular rhythm.     Pulses: Normal pulses.     Heart sounds: Normal heart sounds. No murmur heard.   No friction rub. No gallop.  Pulmonary:     Effort: Pulmonary effort is normal.     Breath sounds: Normal breath sounds.     Comments: O2 saturation dropping in upper 80s for a few seconds then improved to low 90s.  Abdominal:     General: Abdomen is flat. There is no distension.     Palpations: Abdomen is soft.     Tenderness: There is no abdominal tenderness. There is no guarding or rebound.  Musculoskeletal:        General: Normal range of motion.     Cervical back: Neck supple.  Skin:    General: Skin is warm and dry.  Neurological:     General: No focal deficit present.     Mental Status: She is alert.     Comments: Drowsy during initial evaluation. Unable to stay awake to answer questions. Opens eyes to voice  Psychiatric:        Mood and Affect: Mood normal.  Behavior: Behavior normal.    ED Results / Procedures / Treatments   Labs (all labs ordered are listed, but only abnormal results are displayed) Labs Reviewed  COMPREHENSIVE METABOLIC PANEL - Abnormal; Notable for the following components:      Result Value   Glucose, Bld 105 (*)    Creatinine, Ser 1.19 (*)    Calcium 8.4 (*)    Total Protein 5.7 (*)    Albumin 3.0 (*)    GFR, Estimated 45 (*)    All other components within normal limits  CBC WITH DIFFERENTIAL/PLATELET - Abnormal; Notable for the following components:    WBC 12.6 (*)    RBC 3.64 (*)    Hemoglobin 11.1 (*)    HCT 35.9 (*)    Neutro Abs 9.7 (*)    Monocytes Absolute 1.4 (*)    Abs Immature Granulocytes 0.08 (*)    All other components within normal limits  CBG MONITORING, ED - Abnormal; Notable for the following components:   Glucose-Capillary 105 (*)    All other components within normal limits  RESP PANEL BY RT-PCR (FLU A&B, COVID) [id number]  AMMONIA  LACTIC ACID, PLASMA  ETHANOL  URINALYSIS, ROUTINE W REFLEX MICROSCOPIC  TSH  I-STAT ARTERIAL BLOOD GAS, ED    EKG EKG Interpretation  Date/Time:  Friday March 14 2021 21:30:20 EST Ventricular Rate:  71 PR Interval:  206 QRS Duration: 101 QT Interval:  411 QTC Calculation: 447 R Axis:   -12 Text Interpretation: Sinus rhythm Multiple ventricular premature complexes Low voltage, precordial leads Abnormal R-wave progression, early transition Borderline repolarization abnormality No significant change since last tracing Confirmed by Deno Etienne [phone]) on 03/14/2021 10:14:26 PM  Radiology CT HEAD WO CONTRAST  Result Date: 03/14/2021 CLINICAL DATA:  Altered mental status EXAM: CT HEAD WITHOUT CONTRAST TECHNIQUE: Contiguous axial images were obtained from the base of the skull through the vertex without intravenous contrast. COMPARISON:  07/14/2020 FINDINGS: Brain: No evidence of acute infarction, hemorrhage, cerebral edema, mass, mass effect, or midline shift. No hydrocephalus or new extra-axial fluid collection. Likely arachnoid cyst overlying the medial right frontal lobe, unchanged. Periventricular white matter changes, likely the sequela of chronic small vessel ischemic disease. Vascular: No hyperdense vessel. Atherosclerotic calcifications in the intracranial carotid and vertebral arteries. Skull: Normal. Negative for fracture or focal lesion. Sinuses/Orbits: No acute finding. Status post bilateral lens replacements. Other: The mastoid air cells are well aerated. IMPRESSION:  IMPRESSION No acute intracranial process. Electronically Signed   By: Merilyn Baba M.D.   On: 03/14/2021 22:31   DG Chest Port 1 View  Result Date: 03/14/2021 CLINICAL DATA:  Altered mental status. EXAM: PORTABLE CHEST 1 VIEW COMPARISON:  July 16, 2020 FINDINGS: Decreased lung volumes are seen. Mild, stable atelectasis is noted within the bilateral lung bases. There is no evidence of a pleural effusion or pneumothorax. The heart size and mediastinal contours are within normal limits. Degenerative changes are seen throughout the thoracic spine. IMPRESSION: Decreased lung volumes with mild bibasilar atelectasis. Electronically Signed   By: Virgina Norfolk M.D.   On: 03/14/2021 22:28    Procedures Procedures   Medications Ordered in ED Medications - No data to display  ED Course  I have reviewed the triage vital signs and the nursing notes.  Pertinent labs & imaging results that were available during my care of the patient were reviewed by me and considered in my medical decision making (see chart for details).  Clinical Course as of 03/14/21 2356  Fri  Mar 14, 2021  2247 WBC(!): 12.6 [CA]  2316 Hemoglobin(!): 11.1 [CA]    Clinical Course User Index [CA] Suzy Bouchard, PA-C   MDM Rules/Calculators/A&P                         86 year old female presents to the ED due to altered mental status for the past day.  Daughter at bedside states that patient has been extremely lethargic and not acting herself.  Patient currently resides in a SNF.  Normal, stable vitals.  Patient is afebrile, not tachycardic or hypoxic.  Patient mildly tachypneic.  Patient intermittently desaturates into the upper 80s.  Patient placed on 4 L nasal cannula.  Patient in no acute distress.  Patient lethargic during initial evaluation unable to answer any questions.  Abdomen soft, nondistended, nontender.  Lungs clear to auscultation bilaterally.  Routine labs ordered.  CT head to rule out any intracranial  abnormalities.  TSH to rule out thyroid disorder.  CBC significant for mild incidence of 12.6.  Anemia with hemoglobin 11.1.  Influenza negative.  CT head personally reviewed which is negative for any acute abnormalities.  No intracranial hemorrhage.  X-ray demonstrates decreased lung volumes with bibasilar atelectasis.  No signs of pneumonia.  Lactic acid normal.  Ethanol normal.  EKG demonstrates normal sinus rhythm.  No change from previous EKG.  Patient will require admission for AMS and new oxygen requirement. Discussed with Dr. Tyrone Nine who evaluated patient at bedside and agrees with assessment and plan.   11:55 PM Discussed with Dr. Tonie Griffith with [facility] who agrees to admit patient for further treatment. MRI brain and ABG after discussion with Dr. Tonie Griffith. COVID negative.  Final Clinical Impression(s) / ED Diagnoses Final diagnoses:  Altered mental status, unspecified altered mental status type    Rx / DC Orders ED Discharge Orders     None        Suzy Bouchard, PA-C 03/15/21 0001    Deno Etienne, DO 03/15/21 1504

## 2021-03-14 NOTE — ED Notes (Signed)
Pt no longer requiring O2 at this time.

## 2021-03-14 NOTE — ED Triage Notes (Signed)
Pt bib ems, family reports AMS x1 day. Reports increasing lethargy, flu neg per SNF. Baseline gcs 14. Hx of afib.

## 2021-03-15 ENCOUNTER — Other Ambulatory Visit: Payer: Self-pay

## 2021-03-15 ENCOUNTER — Encounter (HOSPITAL_COMMUNITY): Payer: Self-pay | Admitting: Family Medicine

## 2021-03-15 ENCOUNTER — Inpatient Hospital Stay (HOSPITAL_COMMUNITY): Payer: Medicare Other

## 2021-03-15 DIAGNOSIS — Z803 Family history of malignant neoplasm of breast: Secondary | ICD-10-CM | POA: Diagnosis not present

## 2021-03-15 DIAGNOSIS — N179 Acute kidney failure, unspecified: Secondary | ICD-10-CM

## 2021-03-15 DIAGNOSIS — G928 Other toxic encephalopathy: Secondary | ICD-10-CM | POA: Diagnosis present

## 2021-03-15 DIAGNOSIS — Z66 Do not resuscitate: Secondary | ICD-10-CM | POA: Diagnosis present

## 2021-03-15 DIAGNOSIS — R4182 Altered mental status, unspecified: Secondary | ICD-10-CM

## 2021-03-15 DIAGNOSIS — Z20822 Contact with and (suspected) exposure to covid-19: Secondary | ICD-10-CM | POA: Diagnosis present

## 2021-03-15 DIAGNOSIS — Z8249 Family history of ischemic heart disease and other diseases of the circulatory system: Secondary | ICD-10-CM | POA: Diagnosis not present

## 2021-03-15 DIAGNOSIS — Z7901 Long term (current) use of anticoagulants: Secondary | ICD-10-CM | POA: Diagnosis not present

## 2021-03-15 DIAGNOSIS — I130 Hypertensive heart and chronic kidney disease with heart failure and stage 1 through stage 4 chronic kidney disease, or unspecified chronic kidney disease: Secondary | ICD-10-CM | POA: Diagnosis present

## 2021-03-15 DIAGNOSIS — I5032 Chronic diastolic (congestive) heart failure: Secondary | ICD-10-CM | POA: Diagnosis present

## 2021-03-15 DIAGNOSIS — G9341 Metabolic encephalopathy: Secondary | ICD-10-CM | POA: Diagnosis not present

## 2021-03-15 DIAGNOSIS — N1831 Chronic kidney disease, stage 3a: Secondary | ICD-10-CM | POA: Diagnosis present

## 2021-03-15 DIAGNOSIS — M316 Other giant cell arteritis: Secondary | ICD-10-CM | POA: Diagnosis present

## 2021-03-15 DIAGNOSIS — I10 Essential (primary) hypertension: Secondary | ICD-10-CM | POA: Diagnosis not present

## 2021-03-15 DIAGNOSIS — Z8673 Personal history of transient ischemic attack (TIA), and cerebral infarction without residual deficits: Secondary | ICD-10-CM | POA: Diagnosis not present

## 2021-03-15 DIAGNOSIS — F05 Delirium due to known physiological condition: Secondary | ICD-10-CM | POA: Diagnosis present

## 2021-03-15 DIAGNOSIS — R0902 Hypoxemia: Secondary | ICD-10-CM | POA: Diagnosis present

## 2021-03-15 DIAGNOSIS — I2510 Atherosclerotic heart disease of native coronary artery without angina pectoris: Secondary | ICD-10-CM | POA: Diagnosis present

## 2021-03-15 DIAGNOSIS — K219 Gastro-esophageal reflux disease without esophagitis: Secondary | ICD-10-CM | POA: Diagnosis present

## 2021-03-15 DIAGNOSIS — D649 Anemia, unspecified: Secondary | ICD-10-CM | POA: Diagnosis present

## 2021-03-15 DIAGNOSIS — I480 Paroxysmal atrial fibrillation: Secondary | ICD-10-CM | POA: Diagnosis present

## 2021-03-15 DIAGNOSIS — N189 Chronic kidney disease, unspecified: Secondary | ICD-10-CM

## 2021-03-15 DIAGNOSIS — L89612 Pressure ulcer of right heel, stage 2: Secondary | ICD-10-CM | POA: Diagnosis present

## 2021-03-15 DIAGNOSIS — R627 Adult failure to thrive: Secondary | ICD-10-CM | POA: Diagnosis not present

## 2021-03-15 DIAGNOSIS — M069 Rheumatoid arthritis, unspecified: Secondary | ICD-10-CM | POA: Diagnosis present

## 2021-03-15 DIAGNOSIS — M810 Age-related osteoporosis without current pathological fracture: Secondary | ICD-10-CM | POA: Diagnosis present

## 2021-03-15 DIAGNOSIS — F411 Generalized anxiety disorder: Secondary | ICD-10-CM | POA: Diagnosis present

## 2021-03-15 DIAGNOSIS — E785 Hyperlipidemia, unspecified: Secondary | ICD-10-CM | POA: Diagnosis present

## 2021-03-15 DIAGNOSIS — Z90710 Acquired absence of both cervix and uterus: Secondary | ICD-10-CM | POA: Diagnosis not present

## 2021-03-15 LAB — I-STAT ARTERIAL BLOOD GAS, ED
Acid-Base Excess: 2 mmol/L (ref 0.0–2.0)
Bicarbonate: 27.4 mmol/L (ref 20.0–28.0)
Calcium, Ion: 1.23 mmol/L (ref 1.15–1.40)
HCT: 31 % — ABNORMAL LOW (ref 36.0–46.0)
Hemoglobin: 10.5 g/dL — ABNORMAL LOW (ref 12.0–15.0)
O2 Saturation: 97 %
Patient temperature: 99.8
Potassium: 3.7 mmol/L (ref 3.5–5.1)
Sodium: 139 mmol/L (ref 135–145)
TCO2: 29 mmol/L (ref 22–32)
pCO2 arterial: 46.8 mmHg (ref 32.0–48.0)
pH, Arterial: 7.378 (ref 7.350–7.450)
pO2, Arterial: 100 mmHg (ref 83.0–108.0)

## 2021-03-15 LAB — URINALYSIS, ROUTINE W REFLEX MICROSCOPIC
Bilirubin Urine: NEGATIVE
Glucose, UA: NEGATIVE mg/dL
Hgb urine dipstick: NEGATIVE
Ketones, ur: NEGATIVE mg/dL
Leukocytes,Ua: NEGATIVE
Nitrite: NEGATIVE
Protein, ur: NEGATIVE mg/dL
Specific Gravity, Urine: 1.015 (ref 1.005–1.030)
pH: 5.5 (ref 5.0–8.0)

## 2021-03-15 LAB — BASIC METABOLIC PANEL
Anion gap: 10 (ref 5–15)
BUN: 16 mg/dL (ref 8–23)
CO2: 25 mmol/L (ref 22–32)
Calcium: 8.5 mg/dL — ABNORMAL LOW (ref 8.9–10.3)
Chloride: 104 mmol/L (ref 98–111)
Creatinine, Ser: 1.17 mg/dL — ABNORMAL HIGH (ref 0.44–1.00)
GFR, Estimated: 46 mL/min — ABNORMAL LOW (ref >60)
Glucose, Bld: 98 mg/dL (ref 70–99)
Potassium: 3.9 mmol/L (ref 3.5–5.1)
Sodium: 139 mmol/L (ref 135–145)

## 2021-03-15 LAB — CBC
HCT: 34.7 % — ABNORMAL LOW (ref 36.0–46.0)
Hemoglobin: 10.5 g/dL — ABNORMAL LOW (ref 12.0–15.0)
MCH: 30 pg (ref 26.0–34.0)
MCHC: 30.3 g/dL (ref 30.0–36.0)
MCV: 99.1 fL (ref 80.0–100.0)
Platelets: 163 10*3/uL (ref 150–400)
RBC: 3.5 MIL/uL — ABNORMAL LOW (ref 3.87–5.11)
RDW: 15.6 % — ABNORMAL HIGH (ref 11.5–15.5)
WBC: 10.7 10*3/uL — ABNORMAL HIGH (ref 4.0–10.5)
nRBC: 0.2 % (ref 0.0–0.2)

## 2021-03-15 LAB — I-STAT VENOUS BLOOD GAS, ED
Acid-Base Excess: 4 mmol/L — ABNORMAL HIGH (ref 0.0–2.0)
Bicarbonate: 29.8 mmol/L — ABNORMAL HIGH (ref 20.0–28.0)
Calcium, Ion: 1.1 mmol/L — ABNORMAL LOW (ref 1.15–1.40)
HCT: 33 % — ABNORMAL LOW (ref 36.0–46.0)
Hemoglobin: 11.2 g/dL — ABNORMAL LOW (ref 12.0–15.0)
O2 Saturation: 98 %
Potassium: 4 mmol/L (ref 3.5–5.1)
Sodium: 139 mmol/L (ref 135–145)
TCO2: 31 mmol/L (ref 22–32)
pCO2, Ven: 47.1 mmHg (ref 44.0–60.0)
pH, Ven: 7.409 (ref 7.250–7.430)
pO2, Ven: 103 mmHg — ABNORMAL HIGH (ref 32.0–45.0)

## 2021-03-15 LAB — TSH: TSH: 4.912 u[IU]/mL — ABNORMAL HIGH (ref 0.350–4.500)

## 2021-03-15 LAB — TROPONIN I (HIGH SENSITIVITY): Troponin I (High Sensitivity): 14 ng/L (ref <18)

## 2021-03-15 MED ORDER — FENTANYL 12 MCG/HR TD PT72
1.0000 | MEDICATED_PATCH | TRANSDERMAL | Status: DC
  Administered 2021-03-16: 11:00:00 1 via TRANSDERMAL
  Filled 2021-03-15 (×3): qty 1

## 2021-03-15 MED ORDER — LOSARTAN POTASSIUM 50 MG PO TABS
50.0000 mg | ORAL_TABLET | Freq: Every day | ORAL | Status: DC
  Administered 2021-03-15: 50 mg via ORAL
  Filled 2021-03-15: qty 1

## 2021-03-15 MED ORDER — QUETIAPINE FUMARATE 25 MG PO TABS
25.0000 mg | ORAL_TABLET | Freq: Two times a day (BID) | ORAL | Status: DC | PRN
  Administered 2021-03-16 – 2021-03-18 (×3): 25 mg via ORAL
  Filled 2021-03-15 (×4): qty 1

## 2021-03-15 MED ORDER — ONDANSETRON HCL 4 MG/2ML IJ SOLN
4.0000 mg | Freq: Four times a day (QID) | INTRAMUSCULAR | Status: DC | PRN
  Administered 2021-03-18: 13:00:00 4 mg via INTRAVENOUS
  Filled 2021-03-15: qty 2

## 2021-03-15 MED ORDER — SENNOSIDES-DOCUSATE SODIUM 8.6-50 MG PO TABS: 1.0000 | ORAL_TABLET | Freq: Every evening | ORAL | Status: DC | PRN

## 2021-03-15 MED ORDER — ATORVASTATIN CALCIUM 80 MG PO TABS
80.0000 mg | ORAL_TABLET | Freq: Every day | ORAL | Status: DC
  Administered 2021-03-15 – 2021-03-19 (×5): 80 mg via ORAL
  Filled 2021-03-15: qty 1
  Filled 2021-03-15: qty 2
  Filled 2021-03-15 (×3): qty 1

## 2021-03-15 MED ORDER — APIXABAN 5 MG PO TABS
5.0000 mg | ORAL_TABLET | Freq: Two times a day (BID) | ORAL | Status: DC
  Administered 2021-03-15 – 2021-03-19 (×10): 5 mg via ORAL
  Filled 2021-03-15 (×10): qty 1

## 2021-03-15 MED ORDER — METOPROLOL TARTRATE 50 MG PO TABS
100.0000 mg | ORAL_TABLET | Freq: Two times a day (BID) | ORAL | Status: DC
  Administered 2021-03-15 – 2021-03-19 (×9): 100 mg via ORAL
  Filled 2021-03-15 (×4): qty 2
  Filled 2021-03-15: qty 4
  Filled 2021-03-15 (×4): qty 2

## 2021-03-15 MED ORDER — LACTATED RINGERS IV SOLN: INTRAVENOUS | Status: DC

## 2021-03-15 MED ORDER — DILTIAZEM HCL ER COATED BEADS 240 MG PO CP24
240.0000 mg | ORAL_CAPSULE | Freq: Every day | ORAL | Status: DC
  Filled 2021-03-15 (×2): qty 1

## 2021-03-15 MED ORDER — POTASSIUM CHLORIDE CRYS ER 10 MEQ PO TBCR
10.0000 meq | EXTENDED_RELEASE_TABLET | Freq: Every day | ORAL | Status: DC
  Administered 2021-03-15: 10 meq via ORAL
  Filled 2021-03-15: qty 1

## 2021-03-15 MED ORDER — DILTIAZEM HCL ER COATED BEADS 180 MG PO CP24
180.0000 mg | ORAL_CAPSULE | Freq: Every day | ORAL | Status: DC
  Administered 2021-03-16: 10:00:00 180 mg via ORAL
  Filled 2021-03-15: qty 1

## 2021-03-15 MED ORDER — FUROSEMIDE 20 MG PO TABS
60.0000 mg | ORAL_TABLET | Freq: Every day | ORAL | Status: DC
  Administered 2021-03-15: 60 mg via ORAL
  Filled 2021-03-15: qty 3

## 2021-03-15 MED ORDER — NYSTATIN 100000 UNIT/GM EX POWD
Freq: Two times a day (BID) | CUTANEOUS | Status: DC
  Filled 2021-03-15: qty 15

## 2021-03-15 MED ORDER — ESCITALOPRAM OXALATE 10 MG PO TABS
10.0000 mg | ORAL_TABLET | Freq: Every day | ORAL | Status: DC
  Administered 2021-03-15 – 2021-03-19 (×5): 10 mg via ORAL
  Filled 2021-03-15 (×5): qty 1

## 2021-03-15 MED ORDER — PREDNISONE 10 MG PO TABS
10.0000 mg | ORAL_TABLET | Freq: Every day | ORAL | Status: DC
  Administered 2021-03-16 – 2021-03-19 (×4): 10 mg via ORAL
  Filled 2021-03-15 (×4): qty 1

## 2021-03-15 MED ORDER — FENTANYL 12 MCG/HR TD PT72
1.0000 | MEDICATED_PATCH | TRANSDERMAL | Status: DC
  Filled 2021-03-15: qty 1

## 2021-03-15 MED ORDER — ACETAMINOPHEN 325 MG PO TABS
650.0000 mg | ORAL_TABLET | Freq: Four times a day (QID) | ORAL | Status: DC | PRN
  Administered 2021-03-15 – 2021-03-16 (×2): 650 mg via ORAL
  Filled 2021-03-15 (×2): qty 2

## 2021-03-15 MED ORDER — ONDANSETRON HCL 4 MG/2ML IJ SOLN
INTRAMUSCULAR | Status: AC
  Administered 2021-03-15: 4 mg via INTRAVENOUS
  Filled 2021-03-15: qty 2

## 2021-03-15 MED ORDER — ACETAMINOPHEN 650 MG RE SUPP: 650.0000 mg | Freq: Four times a day (QID) | RECTAL | Status: DC | PRN

## 2021-03-15 NOTE — H&P (Signed)
History and Physical    Betty Jackson LTJ:[id number] DOB: [birth year] DOA: 03/14/2021  PCP: Alvester Morin, MD   Patient coming from: SNF  Chief Complaint: Altered mental status, decreased oxygen saturation  HPI: Betty Jackson is a 86 y.o. female with medical history significant for PAF, CAD, HTN, history of CVA, history of temporal arteritis, anemia who presents by EMS from SNF due to altered mental status.  He was also noted to have low oxygen saturation in the mid 80s on room air.  Daughter is at bedside.  Daughter reports that she saw her on the morning of March 14, 2021 and Betty Jackson was in her normal state of health and would talk to her daughter as she normally does.  In the afternoon daughter went back to [facility] she was slumped over on the bed and was not verbal and daughter had a difficult time waking her up.  She had spoke to her on the phone prior to going to visit her and daughter states that her mother was confused and seemed to fall asleep during the conversation on the phone.  There is no report of any fever, vomiting, diarrhea, urinary frequency or dysuria from the nursing home staff to the daughter.  Daughter reports that her mother told her she fell out of bed the previous day but there was no record of this in her chart at the nursing facility.  Patient is on a lot of medications chronically including pain medications.  She is on Eliquis and daughter states she has not missed doses.  ED Course: Betty Jackson has been somnolent and lethargic in the emergency room with no episodes of hypotension or vomiting.  She is not able to provide any history on her own.  She is on oxygen by nasal cannula currently at 2 L/min.  VBG shows pH of 7.409 PCO2 47.1 PO2 103 bicarb 31, sodium 137 potassium 3.8 rider 102 bicarb 25 creatinine 1.19 BUN 17 glucose 105 alkaline phosphatase 72 AST 24 ALT 17.  Lactic acid 1.5.  Ammonia 13, WBC 12,600 hemoglobin 11.1 hematocrit 35.9 platelets 194,000.   COVID-negative, influenza a and B are negative.  Urinalysis is pending.  Troponin pending.  CT of the head shows no acute intracranial pathology.  MRI of the brain is pending.  Hospitalist service of been asked admit for further management.  Review of Systems:  Unable to obtain review of systems secondary to altered mental status  Past Medical History:  Diagnosis Date   Acute respiratory failure with hypoxia ([place]) 05/20/2017   Anemia, unspecified 10/28/2012   Anxiety state, unspecified 10/28/2012   CAD (coronary artery disease)    a. Stent RCA in [facility];  b. Cath approx 2009 - nonobs per pt report.   Cataract    immature on the left eye   Chronic insomnia 02/07/2013   Chronic lower back pain    scoliosis   CKD (chronic kidney disease) stage 3, GFR 30-59 ml/min (HCC)    CVA (cerebral infarction) 10/29/2012   DDD (degenerative disc disease)    Diverticulosis    GERD (gastroesophageal reflux disease) 09/01/2010   Hemorrhoids    Herniated nucleus pulposus, L5-S1, right 11/04/2015   Hyperlipidemia    takes Lipitor daily   Hypertension    takes Amlodipine,Losartan,Metoprolol,and HCTZ daily   Incontinence of urine    Insomnia    takes Restoril nightly   Lumbar stenosis 04/25/2013   Major depressive disorder, recurrent episode, moderate ([place]) 07/16/2013   Osteoporosis  PAF (paroxysmal atrial fibrillation) ([place]) 2011   a. lone epidode in 2011 according to notes.   Rheumatoid arthritis ([place])    Scoliosis    Sepsis ([place]) 05/2019   Stroke ([place])    Temporal arteritis ([place]) 2011   a. followed @ [place]; potential flareup 10/28/2012/notes 10/28/2012   Vocal cord dysfunction    "they don't operate properly" (10/28/2012)    Past Surgical History:  Procedure Laterality Date   ABDOMINAL HYSTERECTOMY  ~ 1984   vaginally   BACK SURGERY  7-23yrs ago   X Stop   BLADDER SUSPENSION  2001   BREAST BIOPSY Right    CATARACT EXTRACTION W/ INTRAOCULAR LENS IMPLANT Right ~ 08/2012   CHEST  TUBE INSERTION Left 03/08/2019   Procedure: Chest Tube Insertion;  Surgeon: Ivin Poot, MD;  Location: [place];  Service: Thoracic;  Laterality: Left;   COLONOSCOPY  01/26/2012   Procedure: COLONOSCOPY;  Surgeon: Ladene Artist, MD,FACG;  Location[facility] ENDOSCOPY;  Service: Endoscopy;  Laterality: N/A;  note the EGD is possible   CORONARY ANGIOPLASTY WITH STENT PLACEMENT  2006   X 1 stent   EPIDURAL BLOCK INJECTION     ESOPHAGOGASTRODUODENOSCOPY  01/26/2012   Procedure: ESOPHAGOGASTRODUODENOSCOPY (EGD);  Surgeon: Ladene Artist, MD,FACG;  Location: [facility] ENDOSCOPY;  Service: Endoscopy;  Laterality: N/A;   FINE NEEDLE ASPIRATION Right 09/26/2019   Procedure: FINE NEEDLE ASPIRATION;  Surgeon: Altamese [place], MD;  Location: [place];  Service: Orthopedics;  Laterality: Right;   HEMIARTHROPLASTY HIP Right 2012   IR EPIDUROGRAPHY  04/20/2019   LAPAROSCOPIC CHOLECYSTECTOMY  2001   LUMBAR FUSION  03/2013   LUMBAR LAMINECTOMY/DECOMPRESSION MICRODISCECTOMY Right 11/04/2015   Procedure: Right Lumbar Five-Sacral One Microdiskectomy;  Surgeon: Kristeen Miss, MD;  Location: [place] NEURO ORS;  Service: Neurosurgery;  Laterality: Right;  Right L5-S1 Microdiskectomy   moles removed that required stiches     one on leg and one on face   ORIF FEMUR FRACTURE Right 09/26/2019   Procedure: OPEN REDUCTION INTERNAL FIXATION (ORIF) DISTAL FEMUR FRACTURE;  Surgeon: Altamese Whitewood, MD;  Location: [place];  Service: Orthopedics;  Laterality: Right;   ORIF FEMUR FRACTURE Right 10/17/2019   Procedure: OPEN REDUCTION INTERNAL FIXATION (ORIF) DISTAL FEMUR FRACTURE;  Surgeon: Altamese El Comb[place], MD;  Location: [place];  Service: Orthopedics;  Laterality: Right;   SUBXYPHOID PERICARDIAL WINDOW N/A 03/08/2019   Procedure: SUBXYPHOID PERICARDIAL WINDOW;  Surgeon: Ivin Poot, MD;  Location: [place];  Service: Thoracic;  Laterality: N/A;   TEE WITHOUT CARDIOVERSION N/A 03/08/2019   Procedure: TRANSESOPHAGEAL ECHOCARDIOGRAM (TEE);  Surgeon: Prescott Gum, Collier Salina, MD;  Location: [place];  Service: Thoracic;  Laterality: N/A;   TEMPORAL ARTERY BIOPSY / LIGATION Bilateral 2011   TONSILLECTOMY AND ADENOIDECTOMY     at age 78   [place]  ~ [place]  ~ 2010   "lower back" (10/28/2012)    Social History  reports that she has never smoked. She has never used smokeless tobacco. She reports current alcohol use. She reports that she does not use drugs.  Allergies  Allergen Reactions   Dextromethorphan Rash    Family History  Problem Relation Age of Onset   Brain cancer Mother 64   Heart attack Father    Breast cancer Daughter 30   Heart disease Other    Hypertension Other    Arthritis Neg Hx    Colon cancer Neg Hx    Osteoporosis Neg Hx  Prior to Admission medications   Medication Sig Start Date End Date Taking? Authorizing Provider  acetaminophen (TYLENOL) 650 MG CR tablet Take 1,300 mg by mouth daily as needed for pain.    [provider]  alendronate (FOSAMAX) 70 MG tablet Take 1 tablet (70 mg total) by mouth every 7 (seven) days. Take with a full glass of water on an empty stomach. 06/14/20   Burchette, Alinda Sierras, MD  Ascorbic Acid (VITAMIN C PO) Take 1 tablet by mouth daily with supper.    [provider]  atorvastatin (LIPITOR) 80 MG tablet Take 1 tablet (80 mg total) by mouth daily. 09/25/20   Garvin Fila, MD  Calcium Carbonate Antacid (TUMS PO) Take 1 tablet by mouth daily with supper.    [provider]  cholecalciferol (VITAMIN D) 25 MCG tablet Take 2 tablets (2,000 Units total) by mouth 2 (two) times daily. 09/29/19   Ainsley Spinner, PA-C  diltiazem (CARDIZEM CD) 240 MG 24 hr capsule Take 1 capsule (240 mg total) by mouth daily. 03/28/20   Burnell Blanks, MD  docusate sodium (COLACE) 100 MG capsule Take 100 mg by mouth daily as needed for mild constipation.    [provider]  ELIQUIS 5 MG TABS tablet TAKE 1 TABLET BY MOUTH TWICE A DAY 05/09/20   Burnell Blanks, MD  escitalopram (LEXAPRO) 10 MG tablet TAKE 1 TABLET BY MOUTH EVERY DAY 08/27/20   Burchette, Alinda Sierras, MD  fentaNYL (DURAGESIC) 25 MCG/HR Place 1 patch onto the skin every 3 (three) days. 06/14/20   Burchette, Alinda Sierras, MD  ferrous sulfate 325 (65 FE) MG tablet Take 1 tablet (325 mg total) by mouth 2 (two) times daily with a meal. Patient not taking: Reported on 11/14/2020 04/21/19   Oswald Hillock, MD  furosemide (LASIX) 40 MG tablet Take 1 tablet (40 mg total) by mouth daily. Please keep upcoming Dr. appt for future refills. Thank you Patient taking differently: Take 60 mg by mouth daily. Please keep upcoming Dr. appt for future refills. Thank you 11/01/20   Leanor Kail, PA  HYDROcodone-acetaminophen (NORCO) 10-325 MG tablet Take one tablet by mouth every 8 hours as needed for breakthrough pain. 08/14/20   Burchette, Alinda Sierras, MD  LACTOBACILLUS PO Take 1 capsule by mouth daily with supper.    [provider]  losartan (COZAAR) 50 MG tablet Take 1 tablet (50 mg total) by mouth daily. 05/24/20   Burchette, Alinda Sierras, MD  MAGNESIUM PO Take 1 tablet by mouth 2 (two) times a week. Patient not taking: Reported on 11/14/2020    [provider]  meloxicam (MOBIC) 7.5 MG tablet Take 7.5 mg by mouth daily.    [provider]  metoprolol tartrate (LOPRESSOR) 100 MG tablet TAKE 1 TABLET BY MOUTH TWICE A DAY 08/28/20   Burnell Blanks, MD  Multiple Vitamin (MULTIVITAMIN WITH MINERALS) TABS tablet Take 1 tablet by mouth daily. 10/21/19   Ainsley Spinner, PA-C  potassium chloride (KLOR-CON) 10 MEQ tablet Take 1 tablet (10 mEq total) by mouth daily. 06/07/20   Burchette, Alinda Sierras, MD  predniSONE (DELTASONE) 10 MG tablet Take 1 tablet (10 mg total) by mouth daily with breakfast. 10/30/19   Charlynne Cousins, MD  QUEtiapine (SEROQUEL) 25 MG tablet TAKE 1 TO 2 TABLETS BY MOUTH AT NIGHT AS NEEDED FOR INSOMNIA AND AGITATION 09/05/20   Burchette, Alinda Sierras, MD  senna-docusate  (SENOKOT-S) 8.6-50 MG tablet Take 1 tablet by mouth every other day.  [provider]  VITAMIN E PO Take 1 capsule by mouth daily with supper. Patient not taking: Reported on 11/14/2020    [provider]    Physical Exam: Vitals:   03/14/21 2230 03/14/21 2245 03/14/21 2300 03/15/21 0012  BP: (!) 164/60 (!) 138/43 (!) 135/52   Pulse: 76 65 68   Resp: 18 (!) 23 (!) 23   Temp:    99.8 F (37.7 C)  TempSrc:    Rectal  SpO2: 92% 98% 99%     Constitutional: NAD, calm, comfortable Vitals:   03/14/21 2230 03/14/21 2245 03/14/21 2300 03/15/21 0012  BP: (!) 164/60 (!) 138/43 (!) 135/52   Pulse: 76 65 68   Resp: 18 (!) 23 (!) 23   Temp:    99.8 F (37.7 C)  TempSrc:    Rectal  SpO2: 92% 98% 99%    General: WDWN, somnolent.  Eyes:PERRL, conjunctivae normal.  Sclera nonicteric HENT:  [place]/AT, external ears normal.  Nares patent without epistasis.  Mucous membranes are dry Neck: Soft, normal passive range of motion, supple, no masses, Trachea midline Respiratory: clear to auscultation bilaterally, no wheezing, no crackles. Normal respiratory effort. No accessory muscle use.  Cardiovascular: Regular rate and rhythm, no murmurs / rubs / gallops. No extremity edema. 2+ pedal pulses.  Abdomen: Soft, no tenderness, nondistended, no rebound or guarding.  No masses palpated. Bowel sounds normoactive Musculoskeletal: Normal passive range of motion of extremities.  No cyanosis. No joint deformity upper and lower extremities.  Skin: Warm, dry, intact no rashes, lesions, ulcers. No induration Neurologic: Somnolent.  Opens eyes to tactile stimulation but does not respond verbally.  Withdraws from painful stimuli.  Patellar DTRs +1 bilaterally Babinski downgoing bilaterally tremor.  No facial droop noted  Labs on Admission: I have personally reviewed following labs and imaging studies  CBC: Recent Labs  Lab 03/14/21 2145 03/14/21 2355  WBC 12.6*  --   NEUTROABS 9.7*  --   HGB  11.1* 11.2*  HCT 35.9* 33.0*  MCV 98.6  --   PLT 194  --     Basic Metabolic Panel: Recent Labs  Lab 03/14/21 2145 03/14/21 2355  NA 137 139  K 3.8 4.0  CL 102  --   CO2 25  --   GLUCOSE 105*  --   BUN 17  --   CREATININE 1.19*  --   CALCIUM 8.4*  --     GFR: CrCl cannot be calculated (Unknown ideal weight.).  Liver Function Tests: Recent Labs  Lab 03/14/21 2145  AST 24  ALT 17  ALKPHOS 72  BILITOT 0.9  PROT 5.7*  ALBUMIN 3.0*    Urine analysis:    Component Value Date/Time   COLORURINE YELLOW 07/17/2020 0231   APPEARANCEUR CLEAR 07/17/2020 0231   LABSPEC 1.044 (H) 07/17/2020 0231   PHURINE 6.0 07/17/2020 0231   GLUCOSEU NEGATIVE 07/17/2020 0231   GLUCOSEU NEGATIVE 07/31/2016 1018   HGBUR NEGATIVE 07/17/2020 0231   BILIRUBINUR NEGATIVE 07/17/2020 0231   BILIRUBINUR neg 07/31/2016 1012   KETONESUR 5 (A) 07/17/2020 0231   PROTEINUR NEGATIVE 07/17/2020 0231   UROBILINOGEN 0.2 07/31/2016 1018   UROBILINOGEN 0.2 07/31/2016 1012   NITRITE NEGATIVE 07/17/2020 0231   LEUKOCYTESUR NEGATIVE 07/17/2020 0231    Radiological Exams on Admission: CT HEAD WO CONTRAST  Result Date: 03/14/2021 CLINICAL DATA:  Altered mental status EXAM: CT HEAD WITHOUT CONTRAST TECHNIQUE: Contiguous axial images were obtained from the base of the skull through the vertex without intravenous  contrast. COMPARISON:  07/14/2020 FINDINGS: Brain: No evidence of acute infarction, hemorrhage, cerebral edema, mass, mass effect, or midline shift. No hydrocephalus or new extra-axial fluid collection. Likely arachnoid cyst overlying the medial right frontal lobe, unchanged. Periventricular white matter changes, likely the sequela of chronic small vessel ischemic disease. Vascular: No hyperdense vessel. Atherosclerotic calcifications in the intracranial carotid and vertebral arteries. Skull: Normal. Negative for fracture or focal lesion. Sinuses/Orbits: No acute finding. Status post bilateral lens  replacements. Other: The mastoid air cells are well aerated. IMPRESSION: IMPRESSION No acute intracranial process. Electronically Signed   By: Merilyn Baba M.D.   On: 03/14/2021 22:31   DG Chest Port 1 View  Result Date: 03/14/2021 CLINICAL DATA:  Altered mental status. EXAM: PORTABLE CHEST 1 VIEW COMPARISON:  July 16, 2020 FINDINGS: Decreased lung volumes are seen. Mild, stable atelectasis is noted within the bilateral lung bases. There is no evidence of a pleural effusion or pneumothorax. The heart size and mediastinal contours are within normal limits. Degenerative changes are seen throughout the thoracic spine. IMPRESSION: Decreased lung volumes with mild bibasilar atelectasis. Electronically Signed   By: Virgina Norfolk M.D.   On: 03/14/2021 22:28    EKG: Independently reviewed.  EKG shows normal sinus rhythm with no acute ST elevation or depression.  QTc 455  Assessment/Plan Principal Problem:   AMS (altered mental status) Betty Jackson is admitted to medical telemetry floor. She has acute change in mental status and is lethargic/somnolent and nonverbal.  This is different than her normal per daughter who is at the bedside.  She daughter reports she saw her in the morning of March 14, 2021 and she was in her normal state of health and talkative and answering questions and by the late afternoon when she went back to see her Betty Jackson was slumped over on her bed and would not respond verbally and was difficult to arouse.  Daughter states she has been this way in the past with sepsis.  She has a history of stroke as well. Patient has been afebrile and has not been tachycardic or hypotensive.  No infiltrate noted on chest x-ray although it is a suboptimal x-ray.  Urine has been obtained and is pending. MRI of brain to rule out CVA is pending AMS may also be secondary to patient's polypharmacy  Active Problems:   Hypoxemia Patient was requiring oxygen by nasal cannula to maintain O2 sat above  92%.  Is currently on 2 L of oxygen by nasal cannula with oxygen saturation of 95 to 97%    AKI (acute kidney injury) Mild AKI.  IV fluid hydration with LR at 75 mils per hour overnight Electrolytes and renal function in morning with labs    Hypertension Home medications.  Monitor blood pressure.    PAF (paroxysmal atrial fibrillation) Continue Eliquis.  Continue Cardizem   DVT prophylaxis: Betty Jackson is on Eliquis chronically which is continued  Code Status:   DNR. Verified by daughter at bedside and has DNR form from SNF  Family Communication:  Plan is discussed with daughter at the bedside.  She verbalized understanding and agrees with plan.  Further recommendations to follow as clinically indicated Disposition Plan:   Patient is from:  SNF  Anticipated DC to:  SNF  Anticipated DC date:  Anticipate 2 midnight or more stay in the hospital to treat acute condition EKG shows normal sinus rhythm with no acute ST elevation or depression.  QTc 455  Admission status:  Inpatient   Leory Plowman  S Cortana Vanderford MD [facility]  How to contact the [facility] Attending or Consulting provider [place] or covering provider during after hours [place], for this patient?   Check the care team in [facility] and look for a) attending/consulting [facility] provider listed and b) the [facility] team listed Log into [email or web address] and use [place]'s universal password to access. If you do not have the password, please contact the hospital operator. Locate the [facility] provider you are looking for under [facility] and page to a number that you can be directly reached. If you still have difficulty reaching the provider, please page the [facility] (Director on Call) for the Hospitalists listed on amion for assistance.  03/15/2021, 12:33 AM

## 2021-03-15 NOTE — Progress Notes (Signed)
EEG done at bedside. No skin breakdown noted. Results pending. 

## 2021-03-15 NOTE — ED Notes (Signed)
Pt took a sip of water at this time. RN was about to administer pt scheduled meds when pt started dry heaving. MD notified. Zofran IVP given. Will continue to monitor. Pt has not taken any of her scheduled meds as pt became nauseous. Will continue to monitor.

## 2021-03-15 NOTE — ED Notes (Signed)
Pt took all of her scheduled meds documented at this time. Taken to xray by transporter.

## 2021-03-15 NOTE — Progress Notes (Addendum)
PROGRESS NOTE    Betty Jackson  OVF:[id number] DOB: [birth year] DOA: 03/14/2021 PCP: Alvester Morin, MD    Chief Complaint  Patient presents with   Altered Mental Status    Brief Narrative:   86 y.o. female past medical history of hypertension, hyperlipidemia, CAD s/p stent to RCA in 2006, paroxysmal atrial fibrillation  on Eliquis, history of CVA, chronic diastolic heart failure, RA and  giant cell arthritis on prednisone , chronic pain syndrome on a fentanyl patch, sent from SNF to ED due to altered mental status, Daughter notes the patient has been more fatigued and confused.  At baseline, patient typically can hold a conversation however, today she has been falling asleep mid conversation  Subjective:  Per family , patient was Recently treated for c diff, finished oral vanc a few days ago, resolved, last bm was yesterday, firmed,  Patient started to c/o Nausea, dry heaving, with decreased responsiveness and  decreased oral intake  She is currently drowsy,open eyes to answer questions briefly, then drift back to sleep, she  not oriented to time/place, she denies pain currently  She is on 02 supplement, o2 in hight 80's , low 90's, does not appear take deep breath, not in acute respiratory distress  One daughter at bedside, another daughter on the phone.  Assessment & Plan:   Principal Problem:   AMS (altered mental status) Active Problems:   Hypertension   PAF (paroxysmal atrial fibrillation) (HCC)   Hypoxemia   AKI (acute kidney injury) ([place])   Acute metabolic encephalopathy -CT head MRI brain no acute findings -UA unremarkable -Alcohol level negative -Lactic acid /ammonia level unremarkable -COVID-19 screening negative ,  flu screening negative -add on EEG Npo, aspiration precaution , continue ivf Continue fentanyl with reduced dose, daughter is not comfortable taking her off fentanyl patch completely   Hypoxia ABG no CO2 intention Already on  anticoagulation Chest x-ray showed decreased lung volumes with mild bibasilar atelectasis No acute infiltrate Continue o2 supplement,  Will try cpap qhs, Recommend outpatient sleep studay  Acute metabolic encephalopathy/hypoxia Could be due to on chronic narcotics, she is also noticed to be on Seroquel as needed in addition to fentanyl patch and Norco, but per family these meds has been chronic, no recent changes, family is reluctant to try taper Could be  hypoactive delirium for recent cdiff Aspiration precaution   CKDIIIa Creatinine appear close to baseline UA unremarkable Renal dosing meds  Normocytic anemia Appear intermittent, with known history of small hiatal hernia, hemorrhoids and diverticula from previous CT EGD and colonoscopy No overt external bleed, monitor hgb   PAF Continue Lopressor, Cardizem and Eliquis  Diastolic CHF Daughter reports legs was swollen before, "Today not bad" she has decreased oral intake, will hold lasix, start gentle hydration, close monitor volume status, resume lasix when able  Hypertension Bp low normal, reduced Lopressor, Cardizem dose with holding parameters  hold Cozaar,/Lasix Continue adjust bp meds accordingly   Hyperlipidemia Continue statin  Giant cell arthritis/RA, continue chronic prednisone 10 mg daily Currently denies headache, denies new vision changes   FTT, baseline bed to wheelchair bound, recently have not been out of bed a lot per family   .  Unresulted Labs (From admission, onward)     Start     Ordered   03/16/21 8416  Basic metabolic panel  Tomorrow morning,   R        03/15/21 1210   03/16/21 0500  Magnesium  Tomorrow morning,   STAT  03/15/21 1210   03/16/21 0500  CBC with Differential/Platelet  Tomorrow morning,   R        03/15/21 1210   03/14/21 2319  TSH  ONCE - STAT,   STAT        03/14/21 2318              DVT prophylaxis:  apixaban (ELIQUIS) tablet 5 mg   Code Status:DNR Family  Communication: two daughters Disposition:   Status is: Inpatient   Dispo: The patient is from: SNF              Anticipated d/c is to: SNF              Anticipated d/c date is: TBD                Consultants:  none  Procedures:  EEG  Antimicrobials:   Anti-infectives (From admission, onward)    None           Objective: Vitals:   03/15/21 1000 03/15/21 1005 03/15/21 1100 03/15/21 1300  BP: (!) 140/57  (!) 96/41 (!) 106/52  Pulse: 92  80 73  Resp: (!) 21  20 19   Temp:  98.6 F (37 C)    TempSrc:  Oral    SpO2: 94%  94% 94%    Intake/Output Summary (Last 24 hours) at 03/15/2021 1508 Last data filed at 03/15/2021 0014 Gross per 24 hour  Intake --  Output 500 ml  Net -500 ml   There were no vitals filed for this visit.  Examination:  General exam: drowsy, lethargic, open eyes to voice, drift back to sleep quickly Respiratory system: diminished ,no wheezing, no rales, no rhonchi. Does not take deep breath, not in acute respiratory distress. Cardiovascular system:  RRR.  Gastrointestinal system: Abdomen is nondistended, soft and nontender.  Normal bowel sounds heard. Central nervous system: drowsy . Extremities:  no significant edema Skin: No rashes, lesions or ulcers Psychiatry: droswy.     Data Reviewed: I have personally reviewed following labs and imaging studies  CBC: Recent Labs  Lab 03/14/21 2145 03/14/21 2355 03/15/21 0146 03/15/21 0159  WBC 12.6*  --   --  10.7*  NEUTROABS 9.7*  --   --   --   HGB 11.1* 11.2* 10.5* 10.5*  HCT 35.9* 33.0* 31.0* 34.7*  MCV 98.6  --   --  99.1  PLT 194  --   --  097    Basic Metabolic Panel: Recent Labs  Lab 03/14/21 2145 03/14/21 2355 03/15/21 0146 03/15/21 0159  NA 137 139 139 139  K 3.8 4.0 3.7 3.9  CL 102  --   --  104  CO2 25  --   --  25  GLUCOSE 105*  --   --  98  BUN 17  --   --  16  CREATININE 1.19*  --   --  1.17*  CALCIUM 8.4*  --   --  8.5*    GFR: CrCl cannot be calculated  (Unknown ideal weight.).  Liver Function Tests: Recent Labs  Lab 03/14/21 2145  AST 24  ALT 17  ALKPHOS 72  BILITOT 0.9  PROT 5.7*  ALBUMIN 3.0*    CBG: Recent Labs  Lab 03/14/21 2155  GLUCAP 105*     Recent Results (from the past 240 hour(s))  Resp Panel by RT-PCR (Flu A&B, Covid) Nasopharyngeal Swab     Status: None   Collection Time: 03/14/21  9:46 PM  Specimen: Nasopharyngeal Swab; Nasopharyngeal(NP) swabs in vial transport medium  Result Value Ref Range Status   SARS Coronavirus 2 by RT PCR NEGATIVE NEGATIVE Final    Comment: (NOTE) SARS-CoV-2 target nucleic acids are NOT DETECTED.  The SARS-CoV-2 RNA is generally detectable in upper respiratory specimens during the acute phase of infection. The lowest concentration of SARS-CoV-2 viral copies this assay can detect is 138 copies/mL. A negative result does not preclude SARS-Cov-2 infection and should not be used as the sole basis for treatment or other patient management decisions. A negative result may occur with  improper specimen collection/handling, submission of specimen other than nasopharyngeal swab, presence of viral mutation(s) within the areas targeted by this assay, and inadequate number of viral copies(<138 copies/mL). A negative result must be combined with clinical observations, patient history, and epidemiological information. The expected result is Negative.  Fact Sheet for Patients:  EntrepreneurPulse.com.au  Fact Sheet for Healthcare Providers:  IncredibleEmployment.be  This test is no t yet approved or cleared by the [place] FDA and  has been authorized for detection and/or diagnosis of SARS-CoV-2 by FDA under an Emergency Use Authorization (EUA). This EUA will remain  in effect (meaning this test can be used) for the duration of the COVID-19 declaration under Section 564(b)(1) of the Act, 21 U.S.C.section 360bbb-3(b)(1), unless the authorization is  terminated  or revoked sooner.       Influenza A by PCR NEGATIVE NEGATIVE Final   Influenza B by PCR NEGATIVE NEGATIVE Final    Comment: (NOTE) The Xpert Xpress SARS-CoV-2/FLU/RSV plus assay is intended as an aid in the diagnosis of influenza from Nasopharyngeal swab specimens and should not be used as a sole basis for treatment. Nasal washings and aspirates are unacceptable for Xpert Xpress SARS-CoV-2/FLU/RSV testing.  Fact Sheet for Patients: EntrepreneurPulse.com.au  Fact Sheet for Healthcare Providers: IncredibleEmployment.be  This test is not yet approved or cleared by the [place] FDA and has been authorized for detection and/or diagnosis of SARS-CoV-2 by FDA under an Emergency Use Authorization (EUA). This EUA will remain in effect (meaning this test can be used) for the duration of the COVID-19 declaration under Section 564(b)(1) of the Act, 21 U.S.C. section 360bbb-3(b)(1), unless the authorization is terminated or revoked.  Performed at [facility], [place] [address] [place], [place] [address]          Radiology Studies: DG Abd 1 View  Result Date: 03/15/2021 CLINICAL DATA:  Altered mental status EXAM: ABDOMEN - 1 VIEW COMPARISON:  None. FINDINGS: Bowel-gas pattern appears within normal limits. No abnormally distended gas-filled loops of bowel to suggest obstruction. No suspicious calcifications identified. Postsurgical changes in the lumbar spine and lower thoracic spine. Right hip arthroplasty. IMPRESSION: No acute abnormality identified. Electronically Signed   By: Ofilia Neas M.D.   On: 03/15/2021 11:10   CT HEAD WO CONTRAST  Result Date: 03/14/2021 CLINICAL DATA:  Altered mental status EXAM: CT HEAD WITHOUT CONTRAST TECHNIQUE: Contiguous axial images were obtained from the base of the skull through the vertex without intravenous contrast. COMPARISON:  07/14/2020 FINDINGS: Brain: No evidence of acute  infarction, hemorrhage, cerebral edema, mass, mass effect, or midline shift. No hydrocephalus or new extra-axial fluid collection. Likely arachnoid cyst overlying the medial right frontal lobe, unchanged. Periventricular white matter changes, likely the sequela of chronic small vessel ischemic disease. Vascular: No hyperdense vessel. Atherosclerotic calcifications in the intracranial carotid and vertebral arteries. Skull: Normal. Negative for fracture or focal lesion. Sinuses/Orbits: No acute finding. Status  post bilateral lens replacements. Other: The mastoid air cells are well aerated. IMPRESSION: IMPRESSION No acute intracranial process. Electronically Signed   By: Merilyn Baba M.D.   On: 03/14/2021 22:31   MR BRAIN WO CONTRAST  Result Date: 03/15/2021 CLINICAL DATA:  Mental status change, unknown cause, lethargy EXAM: MRI HEAD WITHOUT CONTRAST TECHNIQUE: Multiplanar, multiecho pulse sequences of the brain and surrounding structures were obtained without intravenous contrast. COMPARISON:  07/14/2020, correlation is also made with 03/14/2021 CT head FINDINGS: Brain: No restricted diffusion to suggest acute or subacute infarct. No acute hemorrhage, mass, mass effect, or midline shift. Confluent T2 hyperintense signal in the periventricular white matter, likely the sequela of severe chronic small vessel ischemic disease. No hydrocephalus or extra-axial collection. No foci hemosiderin deposition to suggest remote hemorrhage. Vascular: Normal flow voids. Skull and upper cervical spine: Normal marrow signal. Sinuses/Orbits: Negative.  Status post bilateral lens replacements. Other: Trace fluid in left greater than right mastoid air cells. IMPRESSION: No acute intracranial process. No etiology is seen for the patient's mental status change. Electronically Signed   By: Merilyn Baba M.D.   On: 03/15/2021 01:03   DG Chest Port 1 View  Result Date: 03/14/2021 CLINICAL DATA:  Altered mental status. EXAM: PORTABLE  CHEST 1 VIEW COMPARISON:  July 16, 2020 FINDINGS: Decreased lung volumes are seen. Mild, stable atelectasis is noted within the bilateral lung bases. There is no evidence of a pleural effusion or pneumothorax. The heart size and mediastinal contours are within normal limits. Degenerative changes are seen throughout the thoracic spine. IMPRESSION: Decreased lung volumes with mild bibasilar atelectasis. Electronically Signed   By: Virgina Norfolk M.D.   On: 03/14/2021 22:28        Scheduled Meds:  apixaban  5 mg Oral BID   atorvastatin  80 mg Oral Daily   [START ON 03/16/2021] diltiazem  180 mg Oral Daily   escitalopram  10 mg Oral Daily   fentaNYL  1 patch Transdermal Q72H   metoprolol tartrate  100 mg Oral BID   [START ON 03/16/2021] predniSONE  10 mg Oral Q breakfast   Continuous Infusions:  lactated ringers 75 mL/hr at 03/15/21 0205     LOS: 0 days   Time spent: 15mins Greater than 50% of this time was spent in counseling, explanation of diagnosis, planning of further management, and coordination of care.   Voice Recognition Viviann Spare dictation system was used to create this note, attempts have been made to correct errors. Please contact the author with questions and/or clarifications.   Florencia Reasons, MD PhD FACP [facility]  Available via Epic secure chat 7am-7pm for nonurgent issues Please page for urgent issues To page the attending provider between 7A-7P or the covering provider during after hours 7P-7A, please log into the web site [email or web address] and access using universal [place] password for that web site. If you do not have the password, please call the hospital operator.    03/15/2021, 3:08 PM

## 2021-03-15 NOTE — Progress Notes (Signed)
Pt seemed confused when asked about a CPAP, Pt then refused CPAP for the night. RT will continue to monitor as needed.

## 2021-03-15 NOTE — Plan of Care (Signed)
  Problem: Education: Goal: Knowledge of General Education information will improve Description: Including pain rating scale, medication(s)/side effects and non-pharmacologic comfort measures Outcome: Progressing   Problem: Safety: Goal: Ability to remain free from injury will improve Outcome: Progressing   Problem: Skin Integrity: Goal: Risk for impaired skin integrity will decrease Outcome: Progressing   

## 2021-03-15 NOTE — Progress Notes (Signed)
RT obtained ABG at this time.

## 2021-03-15 NOTE — Progress Notes (Signed)
RT came to patient's room to get ABG but patient is in MRI. RT will obtain ABG when patient returns.

## 2021-03-15 NOTE — Procedures (Signed)
Patient Name: Betty Jackson  MRN: [id number]  Epilepsy Attending: Lora Havens  Referring Physician/Provider: Dr Florencia Reasons Date: 03/15/2021 Duration: 21.11 mins  Patient history: 86yo F with ams. EEG to evaluate for seizure  Level of alertness: Awake, asleep  AEDs during EEG study: None  Technical aspects: This EEG study was done with scalp electrodes positioned according to the 10-20 International system of electrode placement. Electrical activity was acquired at a sampling rate of 500Hz  and reviewed with a high frequency filter of 70Hz  and a low frequency filter of 1Hz . EEG data were recorded continuously and digitally stored.   Description: The posterior dominant rhythm consists of 8 Hz activity of moderate voltage (25-35 uV) seen predominantly in posterior head regions, symmetric and reactive to eye opening and eye closing. Sleep was characterized by vertex waves, sleep spindles (12 to 14 Hz), maximal frontocentral region. EEG showed intermittent 3 to 6 Hz theta-delta slowing. Hyperventilation and photic stimulation were not performed.     ABNORMALITY - Intermittent slow, generalized  IMPRESSION: This study is suggestive of mild diffuse encephalopathy, nonspecific etiology. No seizures or epileptiform discharges were seen throughout the recording.  Honor [place] Barbra Sarks

## 2021-03-16 DIAGNOSIS — I480 Paroxysmal atrial fibrillation: Secondary | ICD-10-CM

## 2021-03-16 DIAGNOSIS — N179 Acute kidney failure, unspecified: Secondary | ICD-10-CM

## 2021-03-16 DIAGNOSIS — I10 Essential (primary) hypertension: Secondary | ICD-10-CM

## 2021-03-16 LAB — CBC WITH DIFFERENTIAL/PLATELET
Abs Immature Granulocytes: 0.04 10*3/uL (ref 0.00–0.07)
Basophils Absolute: 0 10*3/uL (ref 0.0–0.1)
Basophils Relative: 0 %
Eosinophils Absolute: 0.1 10*3/uL (ref 0.0–0.5)
Eosinophils Relative: 1 %
HCT: 31.5 % — ABNORMAL LOW (ref 36.0–46.0)
Hemoglobin: 9.7 g/dL — ABNORMAL LOW (ref 12.0–15.0)
Immature Granulocytes: 0 %
Lymphocytes Relative: 13 %
Lymphs Abs: 1.2 10*3/uL (ref 0.7–4.0)
MCH: 30.2 pg (ref 26.0–34.0)
MCHC: 30.8 g/dL (ref 30.0–36.0)
MCV: 98.1 fL (ref 80.0–100.0)
Monocytes Absolute: 1.2 10*3/uL — ABNORMAL HIGH (ref 0.1–1.0)
Monocytes Relative: 13 %
Neutro Abs: 6.6 10*3/uL (ref 1.7–7.7)
Neutrophils Relative %: 73 %
Platelets: 175 10*3/uL (ref 150–400)
RBC: 3.21 MIL/uL — ABNORMAL LOW (ref 3.87–5.11)
RDW: 15.3 % (ref 11.5–15.5)
WBC: 9.1 10*3/uL (ref 4.0–10.5)
nRBC: 0 % (ref 0.0–0.2)

## 2021-03-16 LAB — BASIC METABOLIC PANEL
Anion gap: 10 (ref 5–15)
BUN: 16 mg/dL (ref 8–23)
CO2: 26 mmol/L (ref 22–32)
Calcium: 8.3 mg/dL — ABNORMAL LOW (ref 8.9–10.3)
Chloride: 103 mmol/L (ref 98–111)
Creatinine, Ser: 1.56 mg/dL — ABNORMAL HIGH (ref 0.44–1.00)
GFR, Estimated: 33 mL/min — ABNORMAL LOW (ref >60)
Glucose, Bld: 80 mg/dL (ref 70–99)
Potassium: 4.4 mmol/L (ref 3.5–5.1)
Sodium: 139 mmol/L (ref 135–145)

## 2021-03-16 LAB — MAGNESIUM: Magnesium: 1.7 mg/dL (ref 1.7–2.4)

## 2021-03-16 MED ORDER — QUETIAPINE FUMARATE 25 MG PO TABS
25.0000 mg | ORAL_TABLET | Freq: Every day | ORAL | 0 refills | Status: AC
Start: 2021-03-16 — End: 2022-05-20

## 2021-03-16 MED ORDER — FENTANYL 25 MCG/HR TD PT72: 1.0000 | MEDICATED_PATCH | TRANSDERMAL | 0 refills | Status: DC

## 2021-03-16 MED ORDER — HYDROCODONE-ACETAMINOPHEN 5-325 MG PO TABS: 1.0000 | ORAL_TABLET | Freq: Two times a day (BID) | ORAL | 0 refills | Status: DC | PRN

## 2021-03-16 NOTE — TOC Initial Note (Signed)
Transition of Care [facility]) - Initial/Assessment Note    Patient Details  Name: Betty Jackson MRN: [id number] Date of Birth: 1934/05/16  Transition of [facility]) CM/SW Contact:    Emeterio Reeve, LCSW Phone Number: 03/16/2021, 2:20 PM  Clinical Narrative:                  CSW spoke to pts daughter on the phone. Pt is long term care at [facility] Admission coordinator is not available over the weekend. CSW LVM. Per MD pt will be ready for DC tomorrow. CSW requested covid test.  Expected Discharge Plan: [place]     Patient Goals and CMS Choice Patient states their goals for this hospitalization and ongoing recovery are:: pt unable to assess CMS Medicare.gov Compare Post Acute Care list provided to:: Patient Choice offered to / list presented to : Patient  Expected Discharge Plan and Services Expected Discharge Plan: [place]       Living arrangements for the past 2 months: [place]                                      Prior Living Arrangements/Services Living arrangements for the past 2 months: [place] Lives with:: Self Patient language and need for interpreter reviewed:: Yes Do you feel safe going back to the place where you live?: Yes      Need for Family Participation in Patient Care: Yes (Comment) Care giver support system in place?: Yes (comment)   Criminal Activity/Legal Involvement Pertinent to Current Situation/Hospitalization: No - Comment as needed  Activities of Daily Living Home Assistive Devices/Equipment: Wheelchair ADL Screening (condition at time of admission) Patient's cognitive ability adequate to safely complete daily activities?: No Is the patient deaf or have difficulty hearing?: No Does the patient have difficulty seeing, even when wearing glasses/contacts?: No Does the patient have difficulty concentrating, remembering, or making decisions?: Yes Patient able to express  need for assistance with ADLs?: No Does the patient have difficulty dressing or bathing?: Yes Independently performs ADLs?: No Communication: Independent Dressing (OT): Needs assistance Is this a change from baseline?: Pre-admission baseline Grooming: Needs assistance Is this a change from baseline?: Pre-admission baseline Feeding: Independent Bathing: Needs assistance Is this a change from baseline?: Pre-admission baseline Toileting: Needs assistance Is this a change from baseline?: Pre-admission baseline In/Out Bed: Needs assistance Is this a change from baseline?: Pre-admission baseline Walks in Home: Dependent Is this a change from baseline?: Pre-admission baseline Does the patient have difficulty walking or climbing stairs?: Yes Weakness of Legs: Both Weakness of Arms/Hands: Both  Permission Sought/Granted Permission sought to share information with : Facility Art therapist granted to share information with : Yes, Verbal Permission Granted     Permission granted to share info w AGENCY: [facility]        Emotional Assessment Appearance:: Appears stated age Attitude/Demeanor/Rapport: Unable to Assess Affect (typically observed): Unable to Assess Orientation: : Oriented to Self Alcohol / Substance Use: Not Applicable Psych Involvement: No (comment)  Admission diagnosis:  N&V (nausea and vomiting) [R11.2] Altered mental status, unspecified altered mental status type [R41.82] AMS (altered mental status) [R41.82] Patient Active Problem List   Diagnosis Date Noted   AMS (altered mental status) 03/15/2021   Hypoxemia 03/15/2021   AKI (acute kidney injury) ([place]) 03/15/2021   Stroke-like symptoms 07/14/2020   Cough 05/24/2020   Dyspnea  05/24/2020   Optic neuritis 05/24/2020   Osteopenia 05/24/2020   Rash 05/24/2020   Vitamin D deficiency 05/24/2020   Palliative care by specialist    Goals of care, counseling/discussion    DNR (do not resuscitate)     Weakness    Advanced directives, counseling/discussion    Pressure injury of skin 10/18/2019   Closed fracture of right distal femur ([place]) 10/17/2019   Femur fracture, right ([place]) 09/25/2019   Chronic diastolic CHF (congestive heart failure) ([place]) 09/25/2019   HCAP (healthcare-associated pneumonia) 06/19/2019   Encephalopathy 06/19/2019   Lumbar degenerative disc disease    Paroxysmal A-fib ([place])    Hypomagnesemia    C. difficile colitis    Diarrhea 05/29/2019   CHF (congestive heart failure) ([place]) 05/29/2019   Recurrent falls 04/30/2019   Pericardial effusion without cardiac tamponade 03/29/2019   Hypoalbuminemia 03/27/2019   Cardiac tamponade    Pericarditis 03/06/2019   Fever    Septic shock ([place])    Atypical chest pain    Atrial fibrillation with rapid ventricular response ([place])    Nausea 01/17/2019   Acute diverticulitis 01/13/2019   Chronic back pain 01/13/2019   Tachycardia 01/13/2019   Hypokalemia 01/13/2019   Chronic low back pain 07/10/2018   Influenza A 05/20/2017   Severe sepsis ([place]) 05/20/2017   Acute respiratory failure with hypoxia ([place]) 05/20/2017   CKD (chronic kidney disease), stage III ([place]) 05/20/2017   PAF (paroxysmal atrial fibrillation) ([place]) 05/20/2017   HTN (hypertension) 05/20/2017   Giant cell arteritis ([place]) 05/20/2017   Coronary disease 05/20/2017   Abnormal urinalysis 05/20/2017   Osteoporosis 05/08/2016   Herniated nucleus pulposus, L5-S1, right 11/04/2015   Major depressive disorder, recurrent episode, moderate ([place]) 07/16/2013   Abdominal pain, epigastric 06/22/2013   Lumbar stenosis 04/25/2013   Chronic insomnia 02/07/2013   Elevated transaminase level 02/07/2013   CVA (cerebral infarction) 10/29/2012   Visual changes 10/28/2012   Depression 10/28/2012   Anxiety state 10/28/2012   Anemia, unspecified 10/28/2012   Nonspecific (abnormal) findings on radiological and other examination of gastrointestinal tract 01/25/2012    Hyponatremia 01/24/2012   Hematuria 01/24/2012   Enteritis 12/24/2011   Abdominal pain, other specified site 12/24/2011   Leg pain, right 02/18/2011   Rheumatoid arthritis ([place]) 02/04/2011   Temporal arteritis ([place]) 09/01/2010   Hypertension 09/01/2010   Hyperlipidemia 09/01/2010   History of atrial fibrillation 09/01/2010   CAD (coronary artery disease) 09/01/2010   GERD (gastroesophageal reflux disease) 09/01/2010   Insomnia 09/01/2010   PCP:  Alvester Morin, MD Pharmacy:  No Pharmacies Listed    Social Determinants of Health (SDOH) Interventions    Readmission Risk Interventions Readmission Risk Prevention Plan 10/30/2019 09/26/2019 06/21/2019  Transportation Screening Complete Complete Complete  Medication Review Press photographer) Complete Complete Complete  PCP or Specialist appointment within 3-5 days of discharge Complete Complete Not Complete  PCP/Specialist Appt Not Complete comments - - likely SNF placement  HRI or Home Care Consult Complete Complete Not Complete  HRI or Home Care Consult Pt Refusal Comments - - likely SNF  SW Recovery Care/Counseling Consult Complete Complete Complete  Palliative Care Screening Complete Complete Complete  Skilled Nursing Facility Not Applicable Complete Complete  Some recent data might be hidden    Emeterio Reeve, LCSW Clinical Social Worker

## 2021-03-16 NOTE — Progress Notes (Signed)
PROGRESS NOTE    Betty Jackson  OAC:[id number] DOB: November 15, 1934 DOA: 03/14/2021 PCP: Alvester Morin, MD    Chief Complaint  Patient presents with   Altered Mental Status    Brief Narrative:   86 y.o. female past medical history of hypertension, hyperlipidemia, CAD s/p stent to RCA in 2006, paroxysmal atrial fibrillation  on Eliquis, history of CVA, chronic diastolic heart failure, RA and  giant cell arthritis on prednisone , chronic pain syndrome on a fentanyl patch, sent from SNF to ED due to altered mental status, Daughter notes the patient has been more fatigued and confused.  At baseline, patient typically can hold a conversation however, today she has been falling asleep mid conversation  Subjective: Patient seen and examined.  Daughter at the bedside.  Patient fully alert and oriented.  Daughter also verifies that this is much improved and back to baseline.  Assessment & Plan:   Principal Problem:   AMS (altered mental status) Active Problems:   Hypertension   PAF (paroxysmal atrial fibrillation) (HCC)   Hypoxemia   AKI (acute kidney injury) ([place])   Acute metabolic encephalopathy: -CT head MRI brain no acute findings -UA unremarkable -Alcohol level negative -Lactic acid /ammonia level unremarkable -COVID-19 screening negative ,  flu screening negative EEG unremarkable.  I do agree that her acute encephalopathy is likely secondary to polypharmacy.  I had a lengthy discussion with the daughter and daughter's in agreement to discontinue baclofen altogether and reducing her Norco to 5 mg twice daily as needed instead of 10 mg every 8 hours as needed.  Daughter tells me that even at her facility, she uses Norco only once daily.  We will continue same dose of fentanyl patch.  Hypoxia/atelectasis: Hypoxia likely secondary to atelectasis due to encephalopathy.  Now the patient is alert and oriented, I have advised the RN to provide her with incentive spirometry and  encouraged her to use every hour.  Try to wean oxygen.  No signs of infection.  I was also informed that she is dropping her sats mostly when she is falling asleep, raising suspicion that she has sleep apnea.  She does not have any history of such, recommend outpatient sleep study.  AKI on CKDIIIa: Slight jump in creatinine with 1.56 today.  Appears dehydrated.  Avoid nephrotoxic agents.  Continue to hold Lasix and Cozaar.  Normocytic anemia: At baseline.  PAF: Rates controlled .continue Lopressor, Cardizem and Eliquis  Chronic diastolic CHF: She actually looks dry.  She also has AKI now.  I am going to increase her normal saline to 125 cc/h.  Hypertension: Blood pressure controlled.  Cozaar on hold.  Cardizem CD dose was decreased from 240 mg to 180 mg and she is on Lopressor 90 mg twice daily.  Hyperlipidemia Continue statin  Giant cell arthritis/RA, continue chronic prednisone 10 mg daily Currently denies headache, denies new vision changes   FTT, baseline bed to wheelchair bound, recently have not been out of bed a lot per family   .  Unresulted Labs (From admission, onward)     Start     Ordered   03/17/21 0109  Basic metabolic panel  Daily,   R      03/16/21 0801              DVT prophylaxis:  apixaban (ELIQUIS) tablet 5 mg   Code Status:DNR Family Communication: Daughter at bedside. Disposition:   Status is: Inpatient   Dispo: The patient is from: SNF  Anticipated d/c is to: SNF              Anticipated d/c date is: [facility].                Consultants:  none  Procedures:  EEG  Antimicrobials:   Anti-infectives (From admission, onward)    None           Objective: Vitals:   03/15/21 1100 03/15/21 1300 03/15/21 1610 03/16/21 0911  BP: (!) 96/41 (!) 106/52 (!) 118/45 (!) 144/57  Pulse: 80 73 76 82  Resp: 20 19 20 16   Temp:   98.6 F (37 C) 99.3 F (37.4 C)  TempSrc:   Oral Oral  SpO2: 94% 94% 91% 95%    Intake/Output  Summary (Last 24 hours) at 03/16/2021 1150 Last data filed at 03/16/2021 0300 Gross per 24 hour  Intake 1700.28 ml  Output --  Net 1700.28 ml    There were no vitals filed for this visit.  Examination:  General exam: Appears calm and comfortable, morbidly obese Respiratory system: Diminished breath sounds at bases. Respiratory effort normal.  No crackles or rhonchi. Cardiovascular system: S1 & S2 heard, RRR. No JVD, murmurs, rubs, gallops or clicks.  Trace pitting edema bilateral lower extremity. Gastrointestinal system: Abdomen is nondistended, soft and nontender. No organomegaly or masses felt. Normal bowel sounds heard. Central nervous system: Alert and oriented. No focal neurological deficits. Extremities: Symmetric 5 x 5 power. Skin: No rashes, lesions or ulcers.  Psychiatry: Judgement and insight appear normal. Mood & affect appropriate.    Data Reviewed: I have personally reviewed following labs and imaging studies  CBC: Recent Labs  Lab 03/14/21 2145 03/14/21 2355 03/15/21 0146 03/15/21 0159 03/16/21 0201  WBC 12.6*  --   --  10.7* 9.1  NEUTROABS 9.7*  --   --   --  6.6  HGB 11.1* 11.2* 10.5* 10.5* 9.7*  HCT 35.9* 33.0* 31.0* 34.7* 31.5*  MCV 98.6  --   --  99.1 98.1  PLT 194  --   --  163 175     Basic Metabolic Panel: Recent Labs  Lab 03/14/21 2145 03/14/21 2355 03/15/21 0146 03/15/21 0159 03/16/21 0201  NA 137 139 139 139 139  K 3.8 4.0 3.7 3.9 4.4  CL 102  --   --  104 103  CO2 25  --   --  25 26  GLUCOSE 105*  --   --  98 80  BUN 17  --   --  16 16  CREATININE 1.19*  --   --  1.17* 1.56*  CALCIUM 8.4*  --   --  8.5* 8.3*  MG  --   --   --   --  1.7     GFR: CrCl cannot be calculated (Unknown ideal weight.).  Liver Function Tests: Recent Labs  Lab 03/14/21 2145  AST 24  ALT 17  ALKPHOS 72  BILITOT 0.9  PROT 5.7*  ALBUMIN 3.0*     CBG: Recent Labs  Lab 03/14/21 2155  GLUCAP 105*      Recent Results (from the past 240  hour(s))  Resp Panel by RT-PCR (Flu A&B, Covid) Nasopharyngeal Swab     Status: None   Collection Time: 03/14/21  9:46 PM   Specimen: Nasopharyngeal Swab; Nasopharyngeal(NP) swabs in vial transport medium  Result Value Ref Range Status   SARS Coronavirus 2 by RT PCR NEGATIVE NEGATIVE Final    Comment: (NOTE) SARS-CoV-2 target nucleic acids  are NOT DETECTED.  The SARS-CoV-2 RNA is generally detectable in upper respiratory specimens during the acute phase of infection. The lowest concentration of SARS-CoV-2 viral copies this assay can detect is 138 copies/mL. A negative result does not preclude SARS-Cov-2 infection and should not be used as the sole basis for treatment or other patient management decisions. A negative result may occur with  improper specimen collection/handling, submission of specimen other than nasopharyngeal swab, presence of viral mutation(s) within the areas targeted by this assay, and inadequate number of viral copies(<138 copies/mL). A negative result must be combined with clinical observations, patient history, and epidemiological information. The expected result is Negative.  Fact Sheet for Patients:  EntrepreneurPulse.com.au  Fact Sheet for Healthcare Providers:  IncredibleEmployment.be  This test is no t yet approved or cleared by the [place] FDA and  has been authorized for detection and/or diagnosis of SARS-CoV-2 by FDA under an Emergency Use Authorization (EUA). This EUA will remain  in effect (meaning this test can be used) for the duration of the COVID-19 declaration under Section 564(b)(1) of the Act, 21 U.S.C.section 360bbb-3(b)(1), unless the authorization is terminated  or revoked sooner.       Influenza A by PCR NEGATIVE NEGATIVE Final   Influenza B by PCR NEGATIVE NEGATIVE Final    Comment: (NOTE) The Xpert Xpress SARS-CoV-2/FLU/RSV plus assay is intended as an aid in the diagnosis of influenza from  Nasopharyngeal swab specimens and should not be used as a sole basis for treatment. Nasal washings and aspirates are unacceptable for Xpert Xpress SARS-CoV-2/FLU/RSV testing.  Fact Sheet for Patients: EntrepreneurPulse.com.au  Fact Sheet for Healthcare Providers: IncredibleEmployment.be  This test is not yet approved or cleared by the [place] FDA and has been authorized for detection and/or diagnosis of SARS-CoV-2 by FDA under an Emergency Use Authorization (EUA). This EUA will remain in effect (meaning this test can be used) for the duration of the COVID-19 declaration under Section 564(b)(1) of the Act, 21 U.S.C. section 360bbb-3(b)(1), unless the authorization is terminated or revoked.  Performed at [facility], [address] [place], [place] [address]           Radiology Studies: DG Abd 1 View  Result Date: 03/15/2021 CLINICAL DATA:  Altered mental status EXAM: ABDOMEN - 1 VIEW COMPARISON:  None. FINDINGS: Bowel-gas pattern appears within normal limits. No abnormally distended gas-filled loops of bowel to suggest obstruction. No suspicious calcifications identified. Postsurgical changes in the lumbar spine and lower thoracic spine. Right hip arthroplasty. IMPRESSION: No acute abnormality identified. Electronically Signed   By: Ofilia Neas M.D.   On: 03/15/2021 11:10   CT HEAD WO CONTRAST  Result Date: 03/14/2021 CLINICAL DATA:  Altered mental status EXAM: CT HEAD WITHOUT CONTRAST TECHNIQUE: Contiguous axial images were obtained from the base of the skull through the vertex without intravenous contrast. COMPARISON:  07/14/2020 FINDINGS: Brain: No evidence of acute infarction, hemorrhage, cerebral edema, mass, mass effect, or midline shift. No hydrocephalus or new extra-axial fluid collection. Likely arachnoid cyst overlying the medial right frontal lobe, unchanged. Periventricular white matter changes, likely the sequela  of chronic small vessel ischemic disease. Vascular: No hyperdense vessel. Atherosclerotic calcifications in the intracranial carotid and vertebral arteries. Skull: Normal. Negative for fracture or focal lesion. Sinuses/Orbits: No acute finding. Status post bilateral lens replacements. Other: The mastoid air cells are well aerated. IMPRESSION: IMPRESSION No acute intracranial process. Electronically Signed   By: Merilyn Baba M.D.   On: 03/14/2021 22:31  MR BRAIN WO CONTRAST  Result Date: 03/15/2021 CLINICAL DATA:  Mental status change, unknown cause, lethargy EXAM: MRI HEAD WITHOUT CONTRAST TECHNIQUE: Multiplanar, multiecho pulse sequences of the brain and surrounding structures were obtained without intravenous contrast. COMPARISON:  07/14/2020, correlation is also made with 03/14/2021 CT head FINDINGS: Brain: No restricted diffusion to suggest acute or subacute infarct. No acute hemorrhage, mass, mass effect, or midline shift. Confluent T2 hyperintense signal in the periventricular white matter, likely the sequela of severe chronic small vessel ischemic disease. No hydrocephalus or extra-axial collection. No foci hemosiderin deposition to suggest remote hemorrhage. Vascular: Normal flow voids. Skull and upper cervical spine: Normal marrow signal. Sinuses/Orbits: Negative.  Status post bilateral lens replacements. Other: Trace fluid in left greater than right mastoid air cells. IMPRESSION: No acute intracranial process. No etiology is seen for the patient's mental status change. Electronically Signed   By: Merilyn Baba M.D.   On: 03/15/2021 01:03   DG Chest Port 1 View  Result Date: 03/14/2021 CLINICAL DATA:  Altered mental status. EXAM: PORTABLE CHEST 1 VIEW COMPARISON:  July 16, 2020 FINDINGS: Decreased lung volumes are seen. Mild, stable atelectasis is noted within the bilateral lung bases. There is no evidence of a pleural effusion or pneumothorax. The heart size and mediastinal contours are within  normal limits. Degenerative changes are seen throughout the thoracic spine. IMPRESSION: Decreased lung volumes with mild bibasilar atelectasis. Electronically Signed   By: Virgina Norfolk M.D.   On: 03/14/2021 22:28   EEG adult  Result Date: 03/15/2021 Lora Havens, MD     03/15/2021  3:54 PM Patient Name: JAIRY ANGULO MRN: [id number] Epilepsy Attending: Lora Havens Referring Physician/Provider: Dr Florencia Reasons Date: 03/15/2021 Duration: 21.11 mins Patient history: 86yo F with ams. EEG to evaluate for seizure Level of alertness: Awake, asleep AEDs during EEG study: None Technical aspects: This EEG study was done with scalp electrodes positioned according to the 10-20 International system of electrode placement. Electrical activity was acquired at a sampling rate of 500Hz  and reviewed with a high frequency filter of 70Hz  and a low frequency filter of 1Hz . EEG data were recorded continuously and digitally stored. Description: The posterior dominant rhythm consists of 8 Hz activity of moderate voltage (25-35 uV) seen predominantly in posterior head regions, symmetric and reactive to eye opening and eye closing. Sleep was characterized by vertex waves, sleep spindles (12 to 14 Hz), maximal frontocentral region. EEG showed intermittent 3 to 6 Hz theta-delta slowing. Hyperventilation and photic stimulation were not performed.   ABNORMALITY - Intermittent slow, generalized IMPRESSION: This study is suggestive of mild diffuse encephalopathy, nonspecific etiology. No seizures or epileptiform discharges were seen throughout the recording. Priyanka Barbra Sarks        Scheduled Meds:  apixaban  5 mg Oral BID   atorvastatin  80 mg Oral Daily   diltiazem  180 mg Oral Daily   escitalopram  10 mg Oral Daily   fentaNYL  1 patch Transdermal Q72H   metoprolol tartrate  100 mg Oral BID   nystatin   Topical BID   predniSONE  10 mg Oral Q breakfast   Continuous Infusions:  lactated ringers 125 mL/hr at 03/16/21  0800     LOS: 1 day   Time spent: 34 minutes Greater than 50% of this time was spent in counseling, explanation of diagnosis, planning of further management, and coordination of care.   Voice Recognition Viviann Spare dictation system was used to create this note, attempts have been  made to correct errors. Please contact the author with questions and/or clarifications.   Darliss Cheney, MD  [facility]  Available via Epic secure chat 7am-7pm for nonurgent issues Please page for urgent issues To page the attending provider between 7A-7P or the covering provider during after hours 7P-7A, please log into the web site [email or web address] and access using universal [place] password for that web site. If you do not have the password, please call the hospital operator.    03/16/2021, 11:50 AM

## 2021-03-16 NOTE — Progress Notes (Signed)
PT Cancellation Note  Patient Details Name: Betty Jackson MRN: [id number] DOB: February 13, 1935   Cancelled Treatment:    Reason Eval/Treat Not Completed: Active bedrest order   [name] 03/16/2021, 7:42 AM  Lorrin Goodell, PT  Office # [phone] Pager [phone]

## 2021-03-16 NOTE — Evaluation (Signed)
Physical Therapy Evaluation Patient Details Name: Betty Jackson MRN: [id number] DOB: 10-12-1934 Today's Date: 03/16/2021  History of Present Illness  86 y.o. 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 MRI brain: No acute intracranial process, no etiology is seen for patient's mental status change.   Clinical Impression  Pt admitted with above diagnosis. PTA pt resided at SNF, mobility at w/c level (assist for transfers, self propel). On eval, she required mod assist bed mobility, and mod assist transfers. Pt following simple commands consistently. Lethargic during eval, falling asleep without constant cues/interaction. Pt will benefit from skilled PT to increase their independence and safety with mobility to allow discharge to the venue listed below.  Pt is from SNF. PT to follow acutely to facilitate transfer back to SNF.        Recommendations for follow up therapy are one component of a multi-disciplinary discharge planning process, led by the attending physician.  Recommendations may be updated based on patient status, additional functional criteria and insurance authorization.  Follow Up Recommendations Skilled nursing-short term rehab (<3 hours/day)    Assistance Recommended at Discharge Frequent or constant Supervision/Assistance  Functional Status Assessment Patient has had a recent decline in their functional status and demonstrates the ability to make significant improvements in function in a reasonable and predictable amount of time.  Equipment Recommendations  None recommended by PT    Recommendations for Other Services       Precautions / Restrictions Precautions Precautions: Fall      Mobility  Bed  Mobility Overal bed mobility: Needs Assistance Bed Mobility: Supine to Sit;Sit to Supine     Supine to sit: Mod assist;HOB elevated Sit to supine: Mod assist   General bed mobility comments: increased time, cues for sequencing    Transfers Overall transfer level: Needs assistance Equipment used: Rolling walker (2 wheels) Transfers: Sit to/from Stand;Bed to chair/wheelchair/BSC Sit to Stand: Mod assist   Step pivot transfers: Mod assist       General transfer comment: cues for sequencing and hand placement, increased time    Ambulation/Gait               General Gait Details: nonambulatory at baseline  Stairs            Wheelchair Mobility    Modified Rankin (Stroke Patients Only)       Balance Overall balance assessment: Needs assistance Sitting-balance support: No upper extremity supported;Feet supported Sitting balance-[name]: Fair     Standing balance support: Bilateral upper extremity supported;During functional activity;Reliant on assistive device for balance Standing balance-[name]: Poor                               Pertinent Vitals/Pain Pain Assessment: Faces Faces Pain Scale: Hurts a little bit Pain Location: generalized with mobility Pain Descriptors / Indicators: Grimacing;Discomfort Pain Intervention(s): Limited activity within patient's tolerance;Monitored during session;Repositioned    Home Living Family/patient expects to be discharged to:: Skilled nursing facility                        Prior Function Prior Level of Function : Needs assist  Physical Assist : ADLs (physical);Mobility (physical) Mobility (physical): Transfers;Bed mobility   Mobility Comments: SNF staff assists to/from w/c. Pt able to self propel using BUE/LE. ADLs Comments: Able to self feed. Staff assists with all other ADLs. Pt reports meals are brought to her room.     Hand Dominance   Dominant Hand: Right     Extremity/Trunk Assessment   Upper Extremity Assessment Upper Extremity Assessment: Generalized weakness    Lower Extremity Assessment Lower Extremity Assessment: Generalized weakness    Cervical / Trunk Assessment Cervical / Trunk Assessment: Kyphotic  Communication   Communication: HOH  Cognition Arousal/Alertness: Lethargic Behavior During Therapy: WFL for tasks assessed/performed Overall Cognitive Status: No family/caregiver present to determine baseline cognitive functioning Area of Impairment: Orientation;Attention;Memory;Following commands;Safety/judgement;Problem solving                 Orientation Level: Disoriented to;Place;Situation Current Attention Level: Sustained Memory: Decreased short-term memory Following Commands: Follows one step commands consistently;Follows one step commands with increased time Safety/Judgement: Decreased awareness of safety   Problem Solving: Slow processing;Requires verbal cues;Difficulty sequencing;Decreased initiation   Functional Status Assessment: Patient has had a recent decline in their functional status and demonstrates the ability to make significant improvements in function in a reasonable and predictable amount of time.      General Comments General comments (skin integrity, edema, etc.): VSS on RA    Exercises     Assessment/Plan    PT Assessment Patient needs continued PT services  PT Problem List Decreased strength;Decreased mobility;Decreased safety awareness;Decreased activity tolerance;Decreased cognition;Decreased balance       PT Treatment Interventions Therapeutic activities;Therapeutic exercise;Balance training;Functional mobility training;Patient/family education;Cognitive remediation    PT Goals (Current goals can be found in the Care Plan section)  Acute Rehab PT Goals Patient Stated Goal: not stated PT Goal Formulation: With patient Time For Goal Achievement: 03/30/21 Potential to Achieve Goals:  Good    Frequency Min 2X/week   Barriers to discharge        Co-evaluation               AM-PAC PT "6 Clicks" Mobility  Outcome Measure Help needed turning from your back to your side while in a flat bed without using bedrails?: A Lot Help needed moving from lying on your back to sitting on the side of a flat bed without using bedrails?: A Lot Help needed moving to and from a bed to a chair (including a wheelchair)?: A Lot Help needed standing up from a chair using your arms (e.g., wheelchair or bedside chair)?: A Lot Help needed to walk in hospital room?: Total Help needed climbing 3-5 steps with a railing? : Total 6 Click Score: 10    End of Session Equipment Utilized During Treatment: Gait belt Activity Tolerance: Patient tolerated treatment well Patient left: in bed;with call bell/phone within reach;with bed alarm set Nurse Communication: Mobility status PT Visit Diagnosis: Muscle weakness (generalized) (M62.81);Other abnormalities of gait and mobility (R26.89)    Time: 4562-5638 PT Time Calculation (min) (ACUTE ONLY): 15 min   Charges:   PT Evaluation $PT Eval Moderate Complexity: 1 Mod          Lorrin Goodell, PT  Office # [phone] Pager [phone]   [name] 03/16/2021, 2:44 PM

## 2021-03-16 NOTE — Plan of Care (Signed)
  Problem: Education: Goal: Knowledge of General Education information will improve Description: Including pain rating scale, medication(s)/side effects and non-pharmacologic comfort measures Outcome: Progressing   Problem: Safety: Goal: Ability to remain free from injury will improve Outcome: Progressing   Problem: Skin Integrity: Goal: Risk for impaired skin integrity will decrease Outcome: Progressing   

## 2021-03-16 NOTE — Progress Notes (Signed)
Pt refused CPAP for the night. Pt does not seem to be in any distress and vitals are stable. RT will continue to monitor as needed.

## 2021-03-16 NOTE — Evaluation (Addendum)
Clinical/Bedside Swallow Evaluation Patient Details  Name: Betty Jackson MRN: [id number] Date of Birth: 1934/07/26  Today's Date: 03/16/2021 Time: SLP Start Time (ACUTE ONLY): 36 SLP Stop Time (ACUTE ONLY): 0955 SLP Time Calculation (min) (ACUTE ONLY): 20 min  Past Medical History:  Past Medical History:  Diagnosis Date   Acute respiratory failure with hypoxia ([place]) 05/20/2017   Anemia, unspecified 10/28/2012   Anxiety state, unspecified 10/28/2012   CAD (coronary artery disease)    a. Stent RCA in [facility];  b. Cath approx 2009 - nonobs per pt report.   Cataract    immature on the left eye   Chronic insomnia 02/07/2013   Chronic lower back pain    scoliosis   CKD (chronic kidney disease) stage 3, GFR 30-59 ml/min (HCC)    CVA (cerebral infarction) 10/29/2012   DDD (degenerative disc disease)    Diverticulosis    GERD (gastroesophageal reflux disease) 09/01/2010   Hemorrhoids    Herniated nucleus pulposus, L5-S1, right 11/04/2015   Hyperlipidemia    takes Lipitor daily   Hypertension    takes Amlodipine,Losartan,Metoprolol,and HCTZ daily   Incontinence of urine    Insomnia    takes Restoril nightly   Lumbar stenosis 04/25/2013   Major depressive disorder, recurrent episode, moderate ([place]) 07/16/2013   Osteoporosis    PAF (paroxysmal atrial fibrillation) ([place]) 2011   a. lone epidode in 2011 according to notes.   Rheumatoid arthritis ([place])    Scoliosis    Sepsis ([place]) 05/2019   Stroke ([place])    Temporal arteritis ([place]) 2011   a. followed @ [place]; potential flareup 10/28/2012/notes 10/28/2012   Vocal cord dysfunction    "they don't operate properly" (10/28/2012)   Past Surgical History:  Past Surgical History:  Procedure Laterality Date   ABDOMINAL HYSTERECTOMY  ~ 1984   vaginally   BACK SURGERY  7-58yrs ago   X Stop   BLADDER SUSPENSION  2001   BREAST BIOPSY Right    CATARACT EXTRACTION W/ INTRAOCULAR LENS IMPLANT Right ~ 08/2012   CHEST TUBE INSERTION Left  03/08/2019   Procedure: Chest Tube Insertion;  Surgeon: Ivin Poot, MD;  Location: [facility];  Service: Thoracic;  Laterality: Left;   COLONOSCOPY  01/26/2012   Procedure: COLONOSCOPY;  Surgeon: Ladene Artist, MD,FACG;  Location: [facility] ENDOSCOPY;  Service: Endoscopy;  Laterality: N/A;  note the EGD is possible   CORONARY ANGIOPLASTY WITH STENT PLACEMENT  2006   X 1 stent   EPIDURAL BLOCK INJECTION     ESOPHAGOGASTRODUODENOSCOPY  01/26/2012   Procedure: ESOPHAGOGASTRODUODENOSCOPY (EGD);  Surgeon: Ladene Artist, MD,FACG;  Location: [facility] ENDOSCOPY;  Service: Endoscopy;  Laterality: N/A;   FINE NEEDLE ASPIRATION Right 09/26/2019   Procedure: FINE NEEDLE ASPIRATION;  Surgeon: Altamese Stony Brook, MD;  Location: [place];  Service: Orthopedics;  Laterality: Right;   HEMIARTHROPLASTY HIP Right 2012   IR EPIDUROGRAPHY  04/20/2019   LAPAROSCOPIC CHOLECYSTECTOMY  2001   LUMBAR FUSION  03/2013   LUMBAR LAMINECTOMY/DECOMPRESSION MICRODISCECTOMY Right 11/04/2015   Procedure: Right Lumbar Five-Sacral One Microdiskectomy;  Surgeon: Kristeen Miss, MD;  Location: [facility] NEURO ORS;  Service: Neurosurgery;  Laterality: Right;  Right L5-S1 Microdiskectomy   moles removed that required stiches     one on leg and one on face   ORIF FEMUR FRACTURE Right 09/26/2019   Procedure: OPEN REDUCTION INTERNAL FIXATION (ORIF) DISTAL FEMUR FRACTURE;  Surgeon: Altamese Gandy, MD;  Location: [facility];  Service: Orthopedics;  Laterality: Right;   ORIF FEMUR  FRACTURE Right 10/17/2019   Procedure: OPEN REDUCTION INTERNAL FIXATION (ORIF) DISTAL FEMUR FRACTURE;  Surgeon: Altamese Firth, MD;  Location: [place] [facility];  Service: Orthopedics;  Laterality: Right;   SUBXYPHOID PERICARDIAL WINDOW N/A 03/08/2019   Procedure: SUBXYPHOID PERICARDIAL WINDOW;  Surgeon: Ivin Poot, MD;  Location: [facility];  Service: Thoracic;  Laterality: N/A;   TEE WITHOUT CARDIOVERSION N/A 03/08/2019   Procedure: TRANSESOPHAGEAL ECHOCARDIOGRAM (TEE);  Surgeon: Prescott Gum, Collier Salina, MD;   Location: [place];  Service: Thoracic;  Laterality: N/A;   TEMPORAL ARTERY BIOPSY / LIGATION Bilateral 2011   TONSILLECTOMY AND ADENOIDECTOMY     at age 72   [place]  ~ 57   [place]  ~ 2010   "lower back" (10/28/2012)   HPI:  Patient is an 86 y.o. 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 MRI brain: No acute intracranial process, no etiology is seen for patient's mental status change. CXR: Decreased lung volumes with mild bibasilar atelectasis. UA, ETOH level, lactic acid and ammonia levels all unremarkable, EEG unremarkable with MD suspecting encephalopathy secondary to polypharmacy from patient's pain medications.    Assessment / Plan / Recommendation  Clinical Impression  Patient presents with clinical s/s dysphagia as per this bedside swallow evaluation. She has a h/o dysphagia and has been seen by ST services during previous admissions in 2020 and 2021 however she has been discharged each time on regular solids, thin liquids diet. Per daughter's report (present in room), patient has not been recently having any difficulty with eating or drinking but she did notice that when she gave patient some water this morning she did have some coughing. During today's assessment, patient is alert but seems drowsy and c/o pain (chronic) in back. She consumed thin liquids (water) via straw sips without overt s/s aspiration or penetration but with instances of mild belching (h/o GERD). Patient consumed puree solids (applesauce) without difficulty. She did have mild delay with mastication of cracker however this appears baseline secondary to her missing some dentition. SLP is recommending initiate regular solids, thin liquids diet  at this time and plan to attempt f/u at least one time to ensure diet toleration. SLP Visit Diagnosis: Dysphagia, unspecified (R13.10)    Aspiration Risk  Mild aspiration risk    Diet Recommendation Regular;Thin liquid   Liquid Administration via: Cup;Straw Medication Administration: Whole meds with liquid Supervision: Patient able to self feed;Intermittent supervision to cue for compensatory strategies Compensations: Slow rate;Small sips/bites    Other  Recommendations Oral Care Recommendations: Oral care BID    Recommendations for follow up therapy are one component of a multi-disciplinary discharge planning process, led by the attending physician.  Recommendations may be updated based on patient status, additional functional criteria and insurance authorization.  Follow up Recommendations No SLP follow up      Assistance Recommended at Discharge Set up Supervision/Assistance  Functional Status Assessment Patient has had a recent decline in their functional status and demonstrates the ability to make significant improvements in function in a reasonable and predictable amount of time.  Frequency and Duration min 1 x/week  1 week       Prognosis Prognosis for Safe Diet Advancement: Good      Swallow Study   General  Date of Onset: 03/15/21 HPI: Patient is an 86 y.o. 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 MRI brain: No acute intracranial process, no etiology is seen for patient's mental status change. CXR: Decreased lung volumes with mild bibasilar atelectasis. UA, ETOH level, lactic acid and ammonia levels all unremarkable, EEG unremarkable with MD suspecting encephalopathy secondary to polypharmacy from patient's pain  medications. Type of Study: Bedside Swallow Evaluation Previous Swallow Assessment: dysphagia tx during previous acute and rehab stay after CVA (2019) Diet Prior to this Study: NPO Temperature Spikes Noted: No Respiratory Status: Nasal cannula History of Recent Intubation: No Behavior/Cognition: Alert;Cooperative;Pleasant mood;Lethargic/Drowsy Oral Cavity Assessment: Within Functional Limits Oral Care Completed by SLP: No Oral Cavity - Dentition: Missing dentition Vision: Functional for self-feeding Self-Feeding Abilities: Needs set up;Needs assist Patient Positioning: Upright in bed Baseline Vocal Quality: Normal Volitional Swallow: Able to elicit    Oral/Motor/Sensory Function Overall Oral Motor/Sensory Function: Generalized oral weakness   Ice Chips     Thin Liquid Thin Liquid: Within functional limits Presentation: Straw;Self Fed    Nectar Thick     Honey Thick     Puree Puree: Within functional limits   Solid     Solid: Impaired Oral Phase Impairments: Impaired mastication Other Comments: mildly impaired, delayed mastication however this appears baseline secondary to patient missing some dentition and per daughter's report.      Sonia Baller, MA, CCC-SLP Speech Therapy

## 2021-03-17 DIAGNOSIS — G9341 Metabolic encephalopathy: Secondary | ICD-10-CM

## 2021-03-17 LAB — RESP PANEL BY RT-PCR (FLU A&B, COVID) ARPGX2
Influenza A by PCR: NEGATIVE
Influenza B by PCR: NEGATIVE
SARS Coronavirus 2 by RT PCR: NEGATIVE

## 2021-03-17 LAB — BASIC METABOLIC PANEL
Anion gap: 5 (ref 5–15)
BUN: 16 mg/dL (ref 8–23)
CO2: 28 mmol/L (ref 22–32)
Calcium: 8.1 mg/dL — ABNORMAL LOW (ref 8.9–10.3)
Chloride: 105 mmol/L (ref 98–111)
Creatinine, Ser: 1.2 mg/dL — ABNORMAL HIGH (ref 0.44–1.00)
GFR, Estimated: 45 mL/min — ABNORMAL LOW (ref >60)
Glucose, Bld: 94 mg/dL (ref 70–99)
Potassium: 4.5 mmol/L (ref 3.5–5.1)
Sodium: 138 mmol/L (ref 135–145)

## 2021-03-17 LAB — CBC WITH DIFFERENTIAL/PLATELET
Abs Immature Granulocytes: 0.04 10*3/uL (ref 0.00–0.07)
Basophils Absolute: 0 10*3/uL (ref 0.0–0.1)
Basophils Relative: 0 %
Eosinophils Absolute: 0 10*3/uL (ref 0.0–0.5)
Eosinophils Relative: 0 %
HCT: 27.1 % — ABNORMAL LOW (ref 36.0–46.0)
Hemoglobin: 8.3 g/dL — ABNORMAL LOW (ref 12.0–15.0)
Immature Granulocytes: 1 %
Lymphocytes Relative: 11 %
Lymphs Abs: 0.9 10*3/uL (ref 0.7–4.0)
MCH: 29.5 pg (ref 26.0–34.0)
MCHC: 30.6 g/dL (ref 30.0–36.0)
MCV: 96.4 fL (ref 80.0–100.0)
Monocytes Absolute: 1 10*3/uL (ref 0.1–1.0)
Monocytes Relative: 12 %
Neutro Abs: 6.4 10*3/uL (ref 1.7–7.7)
Neutrophils Relative %: 76 %
Platelets: 171 10*3/uL (ref 150–400)
RBC: 2.81 MIL/uL — ABNORMAL LOW (ref 3.87–5.11)
RDW: 14.9 % (ref 11.5–15.5)
WBC: 8.3 10*3/uL (ref 4.0–10.5)
nRBC: 0 % (ref 0.0–0.2)

## 2021-03-17 MED ORDER — DILTIAZEM HCL ER COATED BEADS 120 MG PO CP24
240.0000 mg | ORAL_CAPSULE | Freq: Every day | ORAL | Status: DC
  Administered 2021-03-17 – 2021-03-19 (×3): 240 mg via ORAL
  Filled 2021-03-17 (×3): qty 2

## 2021-03-17 MED ORDER — SODIUM CHLORIDE 0.9 % IV SOLN: INTRAVENOUS | Status: AC

## 2021-03-17 MED ORDER — IPRATROPIUM-ALBUTEROL 0.5-2.5 (3) MG/3ML IN SOLN
3.0000 mL | Freq: Four times a day (QID) | RESPIRATORY_TRACT | Status: AC | PRN
  Administered 2021-03-18 (×2): 3 mL via RESPIRATORY_TRACT
  Filled 2021-03-17 (×2): qty 3

## 2021-03-17 NOTE — Progress Notes (Signed)
[facility] PROGRESS NOTE    Progress Note  Betty Jackson  JAS:[id number] DOB: [birth year] DOA: 03/14/2021 PCP: Alvester Morin, MD     Brief Narrative:   Betty Jackson is an 86 y.o. female past medical history of essential hypertension, hyperlipidemia CAD status post stent to the RCA in 2016, paroxysmal atrial fibrillation on Eliquis history of CVA chronic diastolic heart failure giant cell arteritis on prednisone, chronic pain syndrome on a fentanyl patch sent from SNF for altered mental status    Assessment/Plan:   Acute metabolic encephalopathy: CT of the head and MRI showed no acute findings, her UA was unremarkable. Lactic acid and ammonia level were unremarkable. Influenza and SARS-CoV-2 PCR were negative. EEG was unremarkable. The cause of her encephalopathy seems to be likely polypharmacy, baclofen, Seroquel and fentanyl.  Hypoxia: Likely related to atelectasis. Continue incentive spirometry try to wean to room air and she remains afebrile no signs of infection. Out of bed to chair continue incentive spirometry.  Acute kidney injury on chronic kidney disease stage IIIa: Baseline creatinine 1.1 now 1.6 she was started on IV fluids and her creatinine is this morning is 1.2. Hold Cozaar Continue IV fluids for an additional 24 hours.  Normocytic anemia: Her hemoglobin seems to be at baseline.  Paroxysmal atrial fibrillation: Rate controlled metoprolol Cardizem and continue Eliquis.  Chronic diastolic heart failure: She appears dry on physical exam per previous physician she also had acute kidney injury. Continue them for an additional 24 hours.  Essential hypertension: Continue Cardizem at her home dose continue metoprolol hold diuretic therapy. Her blood pressure slowly trending up.  Giant cell arthritis: Continue prednisone home dose.  Hyperlipidemia: Continue statins.  Failure to thrive: Wheelchair-bound.  Stage II right heel ulcer present  on admission RN Pressure Injury Documentation: Pressure Injury 10/17/19 Heel Right Stage 2 -  Partial thickness loss of dermis presenting as a shallow open injury with a red, pink wound bed without slough. (Active)  10/17/19 2100  Location: Heel  Location Orientation: Right  Staging: Stage 2 -  Partial thickness loss of dermis presenting as a shallow open injury with a red, pink wound bed without slough.  Wound Description (Comments):   Present on Admission: Yes    Estimated body mass index is 28.07 kg/m as calculated from the following:   Height as of this encounter: 5\' 5"  (1.651 m).   Weight as of this encounter: 76.5 kg. Malnutrition Type:      Malnutrition Characteristics:      Nutrition Interventions:       DVT prophylaxis: eliquis Family Communication:daughter Status is: Inpatient  Remains inpatient appropriate because: Acute kidney injury acute encephalopathy due to polypharmacy        Code Status:     Code Status Orders  (From admission, onward)           Start     Ordered   03/15/21 0104  Do not attempt resuscitation (DNR)  Continuous       Question Answer Comment  In the event of cardiac or respiratory ARREST Do not call a code blue   In the event of cardiac or respiratory ARREST Do not perform Intubation, CPR, defibrillation or ACLS   In the event of cardiac or respiratory ARREST Use medication by any route, position, wound care, and other measures to relive pain and suffering. May use oxygen, suction and manual treatment of airway obstruction as needed for comfort.      03/15/21 0104  Code Status History     Date Active Date Inactive Code Status Order ID Comments User Context   07/14/2020 1453 07/15/2020 0025 DNR [id number]  Karmen Bongo, MD ED   10/25/2019 2013 10/30/2019 2020 DNR [id number]  Bonnielee Haff, MD ED   10/17/2019 0310 10/23/2019 2327 DNR [id number]  Etta Quill, DO ED   09/25/2019 2029 09/30/2019 0331 DNR  [id number]  Toy Baker, MD Inpatient   06/19/2019 2346 06/28/2019 0212 DNR [id number]  Shela Leff, MD ED   06/19/2019 2346 06/19/2019 2346 Full Code [id number]  Shela Leff, MD ED   05/29/2019 0111 06/01/2019 2315 DNR [id number]  Shela Leff, MD ED   04/15/2019 1608 04/21/2019 1833 DNR [id number]  Lequita Halt, MD ED   03/09/2019 0828 03/13/2019 1649 DNR [id number]  Cristal Generous, NP Inpatient   03/08/2019 1225 03/09/2019 0828 Full Code [id number]  Ivin Poot, MD Inpatient   03/06/2019 1802 03/08/2019 1224 DNR [id number]  Candee Furbish, MD Inpatient   03/06/2019 1757 03/06/2019 1802 Full Code [id number]  Candee Furbish, MD Inpatient   01/13/2019 1734 01/20/2019 2138 DNR [id number]  Guilford Shi, MD ED   01/13/2019 1634 01/13/2019 1734 Full Code [id number]  Guilford Shi, MD ED   05/20/2017 1542 05/26/2017 2033 DNR [id number]  Samella Parr, NP ED   11/04/2015 1910 11/05/2015 1658 Full Code [id number]  Kristeen Miss, MD Inpatient   04/25/2013 2250 05/01/2013 1923 Full Code [id number]  Kristeen Miss, MD Inpatient   10/28/2012 1850 10/30/2012 1701 Full Code [id number]  Eugenie Filler, MD Inpatient      Advance Directive Documentation    Flowsheet Row Most Recent Value  Type of Advance Directive Healthcare Power of Attorney  Pre-existing out of facility DNR order (yellow form or pink MOST form) --  "MOST" Form in Place? --         IV Access:   Peripheral IV   Procedures and diagnostic studies:   DG Abd 1 View  Result Date: 03/15/2021 CLINICAL DATA:  Altered mental status EXAM: ABDOMEN - 1 VIEW COMPARISON:  None. FINDINGS: Bowel-gas pattern appears within normal limits. No abnormally distended gas-filled loops of bowel to suggest obstruction. No suspicious calcifications identified. Postsurgical changes in the lumbar spine and lower thoracic spine. Right hip arthroplasty. IMPRESSION: No acute abnormality identified. Electronically Signed   By: Ofilia Neas M.D.    On: 03/15/2021 11:10   EEG adult  Result Date: 03/15/2021 Lora Havens, MD     03/15/2021  3:54 PM Patient Name: Betty Jackson MRN: [id number] Epilepsy Attending: Lora Havens Referring Physician/Provider: Dr Florencia Reasons Date: 03/15/2021 Duration: 21.11 mins Patient history: 86yo F with ams. EEG to evaluate for seizure Level of alertness: Awake, asleep AEDs during EEG study: None Technical aspects: This EEG study was done with scalp electrodes positioned according to the 10-20 International system of electrode placement. Electrical activity was acquired at a sampling rate of 500Hz  and reviewed with a high frequency filter of 70Hz  and a low frequency filter of 1Hz . EEG data were recorded continuously and digitally stored. Description: The posterior dominant rhythm consists of 8 Hz activity of moderate voltage (25-35 uV) seen predominantly in posterior head regions, symmetric and reactive to eye opening and eye closing. Sleep was characterized by vertex waves, sleep spindles (12 to 14 Hz), maximal frontocentral region. EEG showed intermittent 3 to 6 Hz theta-delta slowing. Hyperventilation and photic stimulation were not performed.   ABNORMALITY - Intermittent slow, generalized IMPRESSION: This study is  suggestive of mild diffuse encephalopathy, nonspecific etiology. No seizures or epileptiform discharges were seen throughout the recording. [facility]:   None.   Subjective:    Hesper L Test relates her appetite is slowly improving.  Objective:    Vitals:   03/16/21 1643 03/16/21 2008 03/16/21 2135 03/17/21 0731  BP: (!) 124/43 (!) 115/50 (!) 115/50 (!) 140/57  Pulse: 64 73 73 75  Resp: 15 18 18 16   Temp: 98.5 F (36.9 C) 98.3 F (36.8 C) 98.3 F (36.8 C) 98.1 F (36.7 C)  TempSrc: Oral  Oral Oral  SpO2: 92% 92%  92%  Weight:   76.5 kg   Height:   5\' 5"  (1.651 m)    SpO2: 92 % O2 Flow Rate (L/min): 2 L/min   Intake/Output Summary (Last 24 hours)  at 03/17/2021 0817 Last data filed at 03/17/2021 0300 Gross per 24 hour  Intake 1779 ml  Output --  Net 1779 ml   Filed Weights   03/16/21 2135  Weight: 76.5 kg    Exam: General exam: In no acute distress. Respiratory system: Good air movement and clear to auscultation. Cardiovascular system: S1 & S2 heard, RRR. No JVD. Gastrointestinal system: Abdomen is nondistended, soft and nontender.  Extremities: No pedal edema. Skin: No rashes, lesions or ulcers Psychiatry: Judgement and insight appear normal. Mood & affect appropriate.    Data Reviewed:    Labs: Basic Metabolic Panel: Recent Labs  Lab 03/14/21 2145 03/14/21 2355 03/15/21 0146 03/15/21 0159 03/16/21 0201 03/17/21 0232  NA 137 139 139 139 139 138  K 3.8 4.0 3.7 3.9 4.4 4.5  CL 102  --   --  104 103 105  CO2 25  --   --  25 26 28   GLUCOSE 105*  --   --  98 80 94  BUN 17  --   --  16 16 16   CREATININE 1.19*  --   --  1.17* 1.56* 1.20*  CALCIUM 8.4*  --   --  8.5* 8.3* 8.1*  MG  --   --   --   --  1.7  --    GFR Estimated Creatinine Clearance: 35.7 mL/min (A) (by C-G formula based on SCr of 1.2 mg/dL (H)). Liver Function Tests: Recent Labs  Lab 03/14/21 2145  AST 24  ALT 17  ALKPHOS 72  BILITOT 0.9  PROT 5.7*  ALBUMIN 3.0*   No results for input(s): LIPASE, AMYLASE in the last 168 hours. Recent Labs  Lab 03/14/21 2145  AMMONIA 13   Coagulation profile No results for input(s): INR, PROTIME in the last 168 hours. COVID-19 Labs  No results for input(s): DDIMER, FERRITIN, LDH, CRP in the last 72 hours.  Lab Results  Component Value Date   SARSCOV2NAA NEGATIVE 03/14/2021   SARSCOV2NAA NEGATIVE 07/14/2020   [place] NEGATIVE 10/25/2019   [place] NEGATIVE 10/23/2019    CBC: Recent Labs  Lab 03/14/21 2145 03/14/21 2355 03/15/21 0146 03/15/21 0159 03/16/21 0201 03/17/21 0232  WBC 12.6*  --   --  10.7* 9.1 8.3  NEUTROABS 9.7*  --   --   --  6.6 6.4  HGB 11.1* 11.2* 10.5* 10.5*  9.7* 8.3*  HCT 35.9* 33.0* 31.0* 34.7* 31.5* 27.1*  MCV 98.6  --   --  99.1 98.1 96.4  PLT 194  --   --  163 175 171   Cardiac Enzymes: No results for input(s): CKTOTAL, CKMB, CKMBINDEX, TROPONINI in  the last 168 hours. BNP (last 3 results) No results for input(s): PROBNP in the last 8760 hours. CBG: Recent Labs  Lab 03/14/21 2155  GLUCAP 105*   D-Dimer: No results for input(s): DDIMER in the last 72 hours. Hgb A1c: No results for input(s): HGBA1C in the last 72 hours. Lipid Profile: No results for input(s): CHOL, HDL, LDLCALC, TRIG, CHOLHDL, LDLDIRECT in the last 72 hours. Thyroid function studies: Recent Labs    03/15/21 1606  TSH 4.912*   Anemia work up: No results for input(s): VITAMINB12, FOLATE, FERRITIN, TIBC, IRON, RETICCTPCT in the last 72 hours. Sepsis Labs: Recent Labs  Lab 03/14/21 2145 03/15/21 0159 03/16/21 0201 03/17/21 0232  WBC 12.6* 10.7* 9.1 8.3  LATICACIDVEN 1.5  --   --   --    Microbiology Recent Results (from the past 240 hour(s))  Resp Panel by RT-PCR (Flu A&B, Covid) Nasopharyngeal Swab     Status: None   Collection Time: 03/14/21  9:46 PM   Specimen: Nasopharyngeal Swab; Nasopharyngeal(NP) swabs in vial transport medium  Result Value Ref Range Status   SARS Coronavirus 2 by RT PCR NEGATIVE NEGATIVE Final    Comment: (NOTE) SARS-CoV-2 target nucleic acids are NOT DETECTED.  The SARS-CoV-2 RNA is generally detectable in upper respiratory specimens during the acute phase of infection. The lowest concentration of SARS-CoV-2 viral copies this assay can detect is 138 copies/mL. A negative result does not preclude SARS-Cov-2 infection and should not be used as the sole basis for treatment or other patient management decisions. A negative result may occur with  improper specimen collection/handling, submission of specimen other than nasopharyngeal swab, presence of viral mutation(s) within the areas targeted by this assay, and inadequate  number of viral copies(<138 copies/mL). A negative result must be combined with clinical observations, patient history, and epidemiological information. The expected result is Negative.  Fact Sheet for Patients:  EntrepreneurPulse.com.au  Fact Sheet for Healthcare Providers:  IncredibleEmployment.be  This test is no t yet approved or cleared by the [place] FDA and  has been authorized for detection and/or diagnosis of SARS-CoV-2 by FDA under an Emergency Use Authorization (EUA). This EUA will remain  in effect (meaning this test can be used) for the duration of the COVID-19 declaration under Section 564(b)(1) of the Act, 21 U.S.C.section 360bbb-3(b)(1), unless the authorization is terminated  or revoked sooner.       Influenza A by PCR NEGATIVE NEGATIVE Final   Influenza B by PCR NEGATIVE NEGATIVE Final    Comment: (NOTE) The Xpert Xpress SARS-CoV-2/FLU/RSV plus assay is intended as an aid in the diagnosis of influenza from Nasopharyngeal swab specimens and should not be used as a sole basis for treatment. Nasal washings and aspirates are unacceptable for Xpert Xpress SARS-CoV-2/FLU/RSV testing.  Fact Sheet for Patients: EntrepreneurPulse.com.au  Fact Sheet for Healthcare Providers: IncredibleEmployment.be  This test is not yet approved or cleared by the [place] FDA and has been authorized for detection and/or diagnosis of SARS-CoV-2 by FDA under an Emergency Use Authorization (EUA). This EUA will remain in effect (meaning this test can be used) for the duration of the COVID-19 declaration under Section 564(b)(1) of the Act, 21 U.S.C. section 360bbb-3(b)(1), unless the authorization is terminated or revoked.  Performed at [facility], [place] [address], [place], [place] [address]      Medications:    apixaban  5 mg Oral BID   atorvastatin  80 mg Oral Daily   diltiazem  180 mg  Oral Daily   escitalopram  10 mg Oral Daily   fentaNYL  1 patch Transdermal Q72H   metoprolol tartrate  100 mg Oral BID   nystatin   Topical BID   predniSONE  10 mg Oral Q breakfast   Continuous Infusions:  lactated ringers 125 mL/hr at 03/17/21 0515      LOS: 2 days   Charlynne Cousins  [facility]  03/17/2021, 8:17 AM

## 2021-03-17 NOTE — TOC Progression Note (Signed)
Transition of Care [facility]) - Progression Note    Patient Details  Name: Betty Jackson MRN: [id number] Date of Birth: February 15, 1935  Transition of Care [facility]) CM/SW [place], [place] Phone Number: 03/17/2021, 9:55 AM  Clinical Narrative:     Pt is LTC at [facility]. CSW called and left message with Carmel Sacramento at [facility]; requesting call back regarding upcoming DC.  Expected Discharge Plan: [place]    Expected Discharge Plan and Services Expected Discharge Plan: [place]       Living arrangements for the past 2 months: [place]                                       Social Determinants of Health (SDOH) Interventions    Readmission Risk Interventions Readmission Risk Prevention Plan 10/30/2019 09/26/2019 06/21/2019  Transportation Screening Complete Complete Complete  Medication Review Press photographer) Complete Complete Complete  PCP or Specialist appointment within 3-5 days of discharge Complete Complete Not Complete  PCP/Specialist Appt Not Complete comments - - likely SNF placement  HRI or Home Care Consult Complete Complete Not Complete  HRI or Home Care Consult Pt Refusal Comments - - likely SNF  SW Recovery Care/Counseling Consult Complete Complete Complete  Palliative Care Screening Complete Complete Complete  Skilled Nursing Facility Not Applicable Complete Complete  Some recent data might be hidden

## 2021-03-18 LAB — BASIC METABOLIC PANEL
Anion gap: 8 (ref 5–15)
BUN: 15 mg/dL (ref 8–23)
CO2: 25 mmol/L (ref 22–32)
Calcium: 8.2 mg/dL — ABNORMAL LOW (ref 8.9–10.3)
Chloride: 108 mmol/L (ref 98–111)
Creatinine, Ser: 1.17 mg/dL — ABNORMAL HIGH (ref 0.44–1.00)
GFR, Estimated: 46 mL/min — ABNORMAL LOW (ref >60)
Glucose, Bld: 89 mg/dL (ref 70–99)
Potassium: 4.7 mmol/L (ref 3.5–5.1)
Sodium: 141 mmol/L (ref 135–145)

## 2021-03-18 MED ORDER — HYDROCODONE-ACETAMINOPHEN 10-325 MG PO TABS
1.0000 | ORAL_TABLET | Freq: Four times a day (QID) | ORAL | Status: DC | PRN
Start: 2021-03-18 — End: 2021-03-19
  Administered 2021-03-19: 15:00:00 1 via ORAL
  Filled 2021-03-18: qty 1

## 2021-03-18 MED ORDER — FENTANYL 12 MCG/HR TD PT72
1.0000 | MEDICATED_PATCH | TRANSDERMAL | Status: DC
  Administered 2021-03-18: 09:00:00 1 via TRANSDERMAL
  Filled 2021-03-18: qty 1

## 2021-03-18 MED ORDER — FENTANYL 12 MCG/HR TD PT72
1.0000 | MEDICATED_PATCH | TRANSDERMAL | Status: DC
Start: 2021-03-18 — End: 2021-03-18

## 2021-03-18 MED ORDER — FENTANYL 25 MCG/HR TD PT72: 1.0000 | MEDICATED_PATCH | TRANSDERMAL | Status: DC

## 2021-03-18 MED ORDER — FENTANYL CITRATE PF 50 MCG/ML IJ SOSY
25.0000 ug | PREFILLED_SYRINGE | INTRAMUSCULAR | Status: AC
  Administered 2021-03-18 (×2): 25 ug via INTRAVENOUS
  Filled 2021-03-18 (×2): qty 1

## 2021-03-18 MED ORDER — FENTANYL 25 MCG/HR TD PT72
1.0000 | MEDICATED_PATCH | TRANSDERMAL | Status: DC
  Administered 2021-03-18: 11:00:00 1 via TRANSDERMAL
  Filled 2021-03-18 (×2): qty 1

## 2021-03-18 NOTE — Progress Notes (Signed)
Speech Language Pathology Treatment: Dysphagia  Patient Details Name: Betty Jackson MRN: [id number] DOB: Aug 13, 1934 Today's Date: 03/18/2021 Time: 0289-0228 SLP Time Calculation (min) (ACUTE ONLY): 20 min  Assessment / Plan / Recommendation Clinical Impression  F/u after initial clinical swallow evaluation.  Pt has had more delirium last day or so.  Today she remained confused, but acknowledged this was the case, and at the same time was able to convey good information about her hx of vocal fold dysfunction.  She was diagnosed 17 years ago at a clinic in [place]. (Records from 02/2019 swallow eval affirm this information). She stated that there were no good options for treatment; that she wasn't a surgical candidate, and that tucking her chin when drinking was often helpful in eliminating cough.  She has been evaluated in the [facility] system four times since 2020 - her symptoms of choking/coughing while eating remain consistent. Today, when she carried out a chin tuck, she did not exhibit further problems, however it sounds as if the episodes are unpredictable and the coughing can be explosive.  Given that she continues to tolerate a regular solid diet and thin liquids with no adverse consequences, I wouldn't recommend making modifications.  No further SLP f/u is needed. Our service will sign off.       HPI HPI: Patient is an 86 y.o. 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 MRI brain: No acute intracranial process, no etiology is seen for patient's mental status change. CXR: Decreased lung volumes with mild bibasilar atelectasis. UA, ETOH level, lactic acid and ammonia levels all unremarkable, EEG unremarkable with MD suspecting encephalopathy  secondary to polypharmacy from patient's pain medications.      SLP Plan  All goals met      Recommendations for follow up therapy are one component of a multi-disciplinary discharge planning process, led by the attending physician.  Recommendations may be updated based on patient status, additional functional criteria and insurance authorization.    Recommendations  Diet recommendations: Regular;Thin liquid Liquids provided via: Cup;Straw Medication Administration: Whole meds with puree Supervision: Patient able to self feed Compensations: Chin tuck Postural Changes and/or Swallow Maneuvers: Seated upright 90 degrees                Oral Care Recommendations: Oral care BID Follow Up Recommendations: No SLP follow up Assistance recommended at discharge: Set up Supervision/Assistance SLP Visit Diagnosis: Dysphagia, unspecified (R13.10) Plan: All goals met         Darbie Biancardi L. Tivis Ringer, [place] Office number [phone] Pager [phone]   Assunta Curtis  03/18/2021, 2:43 PM

## 2021-03-18 NOTE — Plan of Care (Signed)

## 2021-03-18 NOTE — Progress Notes (Signed)
[facility] PROGRESS NOTE    Progress Note  Betty Jackson  WUJ:[id number] DOB: 27-May-1934 DOA: 03/14/2021 PCP: Alvester Morin, MD     Brief Narrative:   Betty WAHLER is an 86 y.o. female past medical history of essential hypertension, hyperlipidemia CAD status post stent to the RCA in 2016, paroxysmal atrial fibrillation on Eliquis history of CVA chronic diastolic heart failure giant cell arteritis on prednisone, chronic pain syndrome on a fentanyl patch sent from SNF for altered mental status, there is likely due to polypharmacy in the setting of fentanyl, Lyrica, Seroquel baclofen and acute kidney injury.  CT of the head and MRI showed no acute findings EEG was unremarkable.  This resolved but then due to the lower dose of fentanyl she started becoming agitated so she was put back on her fentanyl dose.  Assessment/Plan:   Acute metabolic encephalopathy/acute confusional state: The cause of her encephalopathy seems to be likely polypharmacy, baclofen, Seroquel and fentanyl. Now has developed delirium probably due to lower dose of fentanyl we will go back to her skilled nursing facility regimen, will also give her 2 doses of fentanyl IV to try to control her pain.  Hypoxia: Likely related to atelectasis. Continue incentive spirometry try to wean to room air and she remains afebrile no signs of infection. Out of bed to chair continue incentive spirometry.  Acute kidney injury on chronic kidney disease stage IIIa: Baseline creatinine 1.1 now 1.6 she was started on IV fluids and her creatinine is this morning is 1.2. Prerenal azotemia in setting of decreased oral intake and ARB ARB was held on admission.  Normocytic anemia: Her hemoglobin seems to be at baseline.  Paroxysmal atrial fibrillation: Rate controlled metoprolol Cardizem and continue Eliquis.  Chronic diastolic heart failure: She appears dry on physical exam per previous physician she also had acute kidney  injury. Continue them for an additional 24 hours.  Essential hypertension: Continue Cardizem at her home dose continue metoprolol hold diuretic therapy. Her blood pressure slowly trending up.  Giant cell arthritis: Continue prednisone home dose.  Hyperlipidemia: Continue statins.  Failure to thrive: Wheelchair-bound.  Stage II right heel ulcer present on admission RN Pressure Injury Documentation: Pressure Injury 10/17/19 Heel Right Stage 2 -  Partial thickness loss of dermis presenting as a shallow open injury with a red, pink wound bed without slough. (Active)  10/17/19 2100  Location: Heel  Location Orientation: Right  Staging: Stage 2 -  Partial thickness loss of dermis presenting as a shallow open injury with a red, pink wound bed without slough.  Wound Description (Comments):   Present on Admission: Yes    Estimated body mass index is 28.07 kg/m as calculated from the following:   Height as of this encounter: 5\' 5"  (1.651 m).   Weight as of this encounter: 76.5 kg.    DVT prophylaxis: eliquis Family Communication:daughter Status is: Inpatient  Remains inpatient appropriate because: Acute kidney injury acute encephalopathy due to polypharmacy     Code Status:     Code Status Orders  (From admission, onward)           Start     Ordered   03/15/21 0104  Do not attempt resuscitation (DNR)  Continuous       Question Answer Comment  In the event of cardiac or respiratory ARREST Do not call a code blue   In the event of cardiac or respiratory ARREST Do not perform Intubation, CPR, defibrillation or ACLS   In the  event of cardiac or respiratory ARREST Use medication by any route, position, wound care, and other measures to relive pain and suffering. May use oxygen, suction and manual treatment of airway obstruction as needed for comfort.      03/15/21 0104           Code Status History     Date Active Date Inactive Code Status Order ID Comments  User Context   07/14/2020 1453 07/15/2020 0025 DNR [id number]  Karmen Bongo, MD ED   10/25/2019 2013 10/30/2019 2020 DNR [id number]  Bonnielee Haff, MD ED   10/17/2019 0310 10/23/2019 2327 DNR [id number]  Etta Quill, DO ED   09/25/2019 2029 09/30/2019 0331 DNR [id number]  Toy Baker, MD Inpatient   06/19/2019 2346 06/28/2019 0212 DNR [id number]  Shela Leff, MD ED   06/19/2019 2346 06/19/2019 2346 Full Code [id number]  Shela Leff, MD ED   05/29/2019 0111 06/01/2019 2315 DNR [id number]  Shela Leff, MD ED   04/15/2019 1608 04/21/2019 1833 DNR [id number]  Lequita Halt, MD ED   03/09/2019 0828 03/13/2019 1649 DNR [id number]  Cristal Generous, NP Inpatient   03/08/2019 1225 03/09/2019 0828 Full Code [id number]  Ivin Poot, MD Inpatient   03/06/2019 1802 03/08/2019 1224 DNR [id number]  Candee Furbish, MD Inpatient   03/06/2019 1757 03/06/2019 1802 Full Code [id number]  Candee Furbish, MD Inpatient   01/13/2019 1734 01/20/2019 2138 DNR [id number]  Guilford Shi, MD ED   01/13/2019 1634 01/13/2019 1734 Full Code [id number]  Guilford Shi, MD ED   05/20/2017 1542 05/26/2017 2033 DNR [id number]  Samella Parr, NP ED   11/04/2015 1910 11/05/2015 1658 Full Code [id number]  Kristeen Miss, MD Inpatient   04/25/2013 2250 05/01/2013 1923 Full Code [id number]  Kristeen Miss, MD Inpatient   10/28/2012 1850 10/30/2012 1701 Full Code [id number]  Eugenie Filler, MD Inpatient      Advance Directive Documentation    Flowsheet Row Most Recent Value  Type of Advance Directive Healthcare Power of Attorney  Pre-existing out of facility DNR order (yellow form or pink MOST form) --  "MOST" Form in Place? --         IV Access:   Peripheral IV   Procedures and diagnostic studies:   No results found.   Medical Consultants:   None.   Subjective:    [name] patient is confused, daughter is at bedside and related that she gets like this when she is in the lower dose of her narcotics the  patient is close to complaining of pain.  Objective:    Vitals:   03/17/21 2102 03/18/21 0029 03/18/21 0539 03/18/21 0800  BP: (!) 159/73  (!) 120/92 (!) 157/85  Pulse: 74  73 81  Resp: 15  16 16   Temp:   97.7 F (36.5 C) 99.1 F (37.3 C)  TempSrc:    Oral  SpO2: 91% 99% 95% 90%  Weight:      Height:       SpO2: 90 % O2 Flow Rate (L/min): 2 L/min   Intake/Output Summary (Last 24 hours) at 03/18/2021 0801 Last data filed at 03/17/2021 1503 Gross per 24 hour  Intake 500 ml  Output --  Net 500 ml    Filed Weights   03/16/21 2135  Weight: 76.5 kg    Exam: General exam: In no acute distress. Respiratory system: Good air movement and clear to auscultation. Cardiovascular system: S1 & S2 heard, RRR. No JVD. Gastrointestinal system: Abdomen is  nondistended, soft and nontender.  Extremities: No pedal edema. Skin: No rashes, lesions or ulcers Psychiatry: Fused and fidgety   Data Reviewed:    Labs: Basic Metabolic Panel: Recent Labs  Lab 03/14/21 2145 03/14/21 2355 03/15/21 0146 03/15/21 0159 03/16/21 0201 03/17/21 0232 03/18/21 0418  NA 137   < > 139 139 139 138 141  K 3.8   < > 3.7 3.9 4.4 4.5 4.7  CL 102  --   --  104 103 105 108  CO2 25  --   --  25 26 28 25   GLUCOSE 105*  --   --  98 80 94 89  BUN 17  --   --  16 16 16 15   CREATININE 1.19*  --   --  1.17* 1.56* 1.20* 1.17*  CALCIUM 8.4*  --   --  8.5* 8.3* 8.1* 8.2*  MG  --   --   --   --  1.7  --   --    < > = values in this interval not displayed.    GFR Estimated Creatinine Clearance: 36.6 mL/min (A) (by C-G formula based on SCr of 1.17 mg/dL (H)). Liver Function Tests: Recent Labs  Lab 03/14/21 2145  AST 24  ALT 17  ALKPHOS 72  BILITOT 0.9  PROT 5.7*  ALBUMIN 3.0*    No results for input(s): LIPASE, AMYLASE in the last 168 hours. Recent Labs  Lab 03/14/21 2145  AMMONIA 13    Coagulation profile No results for input(s): INR, PROTIME in the last 168 hours. COVID-19 Labs  No  results for input(s): DDIMER, FERRITIN, LDH, CRP in the last 72 hours.  Lab Results  Component Value Date   SARSCOV2NAA NEGATIVE 03/17/2021   SARSCOV2NAA NEGATIVE 03/14/2021   [place] NEGATIVE 07/14/2020   [place] NEGATIVE 10/25/2019    CBC: Recent Labs  Lab 03/14/21 2145 03/14/21 2355 03/15/21 0146 03/15/21 0159 03/16/21 0201 03/17/21 0232  WBC 12.6*  --   --  10.7* 9.1 8.3  NEUTROABS 9.7*  --   --   --  6.6 6.4  HGB 11.1* 11.2* 10.5* 10.5* 9.7* 8.3*  HCT 35.9* 33.0* 31.0* 34.7* 31.5* 27.1*  MCV 98.6  --   --  99.1 98.1 96.4  PLT 194  --   --  163 175 171    Cardiac Enzymes: No results for input(s): CKTOTAL, CKMB, CKMBINDEX, TROPONINI in the last 168 hours. BNP (last 3 results) No results for input(s): PROBNP in the last 8760 hours. CBG: Recent Labs  Lab 03/14/21 2155  GLUCAP 105*    D-Dimer: No results for input(s): DDIMER in the last 72 hours. Hgb A1c: No results for input(s): HGBA1C in the last 72 hours. Lipid Profile: No results for input(s): CHOL, HDL, LDLCALC, TRIG, CHOLHDL, LDLDIRECT in the last 72 hours. Thyroid function studies: Recent Labs    03/15/21 1606  TSH 4.912*    Anemia work up: No results for input(s): VITAMINB12, FOLATE, FERRITIN, TIBC, IRON, RETICCTPCT in the last 72 hours. Sepsis Labs: Recent Labs  Lab 03/14/21 2145 03/15/21 0159 03/16/21 0201 03/17/21 0232  WBC 12.6* 10.7* 9.1 8.3  LATICACIDVEN 1.5  --   --   --     Microbiology Recent Results (from the past 240 hour(s))  Resp Panel by RT-PCR (Flu A&B, Covid) Nasopharyngeal Swab     Status: None   Collection Time: 03/14/21  9:46 PM   Specimen: Nasopharyngeal Swab; Nasopharyngeal(NP) swabs in vial transport medium  Result Value Ref Range  Status   SARS Coronavirus 2 by RT PCR NEGATIVE NEGATIVE Final    Comment: (NOTE) SARS-CoV-2 target nucleic acids are NOT DETECTED.  The SARS-CoV-2 RNA is generally detectable in upper respiratory specimens during the acute phase  of infection. The lowest concentration of SARS-CoV-2 viral copies this assay can detect is 138 copies/mL. A negative result does not preclude SARS-Cov-2 infection and should not be used as the sole basis for treatment or other patient management decisions. A negative result may occur with  improper specimen collection/handling, submission of specimen other than nasopharyngeal swab, presence of viral mutation(s) within the areas targeted by this assay, and inadequate number of viral copies(<138 copies/mL). A negative result must be combined with clinical observations, patient history, and epidemiological information. The expected result is Negative.  Fact Sheet for Patients:  EntrepreneurPulse.com.au  Fact Sheet for Healthcare Providers:  IncredibleEmployment.be  This test is no t yet approved or cleared by the [place] FDA and  has been authorized for detection and/or diagnosis of SARS-CoV-2 by FDA under an Emergency Use Authorization (EUA). This EUA will remain  in effect (meaning this test can be used) for the duration of the COVID-19 declaration under Section 564(b)(1) of the Act, 21 U.S.C.section 360bbb-3(b)(1), unless the authorization is terminated  or revoked sooner.       Influenza A by PCR NEGATIVE NEGATIVE Final   Influenza B by PCR NEGATIVE NEGATIVE Final    Comment: (NOTE) The Xpert Xpress SARS-CoV-2/FLU/RSV plus assay is intended as an aid in the diagnosis of influenza from Nasopharyngeal swab specimens and should not be used as a sole basis for treatment. Nasal washings and aspirates are unacceptable for Xpert Xpress SARS-CoV-2/FLU/RSV testing.  Fact Sheet for Patients: EntrepreneurPulse.com.au  Fact Sheet for Healthcare Providers: IncredibleEmployment.be  This test is not yet approved or cleared by the [place] FDA and has been authorized for detection and/or diagnosis of  SARS-CoV-2 by FDA under an Emergency Use Authorization (EUA). This EUA will remain in effect (meaning this test can be used) for the duration of the COVID-19 declaration under Section 564(b)(1) of the Act, 21 U.S.C. section 360bbb-3(b)(1), unless the authorization is terminated or revoked.  Performed at [facility], [place] [address], [place], [place] [address]   Resp Panel by RT-PCR (Flu A&B, Covid) Nasopharyngeal Swab     Status: None   Collection Time: 03/17/21 12:41 PM   Specimen: Nasopharyngeal Swab; Nasopharyngeal(NP) swabs in vial transport medium  Result Value Ref Range Status   SARS Coronavirus 2 by RT PCR NEGATIVE NEGATIVE Final    Comment: (NOTE) SARS-CoV-2 target nucleic acids are NOT DETECTED.  The SARS-CoV-2 RNA is generally detectable in upper respiratory specimens during the acute phase of infection. The lowest concentration of SARS-CoV-2 viral copies this assay can detect is 138 copies/mL. A negative result does not preclude SARS-Cov-2 infection and should not be used as the sole basis for treatment or other patient management decisions. A negative result may occur with  improper specimen collection/handling, submission of specimen other than nasopharyngeal swab, presence of viral mutation(s) within the areas targeted by this assay, and inadequate number of viral copies(<138 copies/mL). A negative result must be combined with clinical observations, patient history, and epidemiological information. The expected result is Negative.  Fact Sheet for Patients:  EntrepreneurPulse.com.au  Fact Sheet for Healthcare Providers:  IncredibleEmployment.be  This test is no t yet approved or cleared by the [place] FDA and  has been authorized for detection and/or diagnosis of SARS-CoV-2  by FDA under an Emergency Use Authorization (EUA). This EUA will remain  in effect (meaning this test can be used) for the duration of  the COVID-19 declaration under Section 564(b)(1) of the Act, 21 U.S.C.section 360bbb-3(b)(1), unless the authorization is terminated  or revoked sooner.       Influenza A by PCR NEGATIVE NEGATIVE Final   Influenza B by PCR NEGATIVE NEGATIVE Final    Comment: (NOTE) The Xpert Xpress SARS-CoV-2/FLU/RSV plus assay is intended as an aid in the diagnosis of influenza from Nasopharyngeal swab specimens and should not be used as a sole basis for treatment. Nasal washings and aspirates are unacceptable for Xpert Xpress SARS-CoV-2/FLU/RSV testing.  Fact Sheet for Patients: EntrepreneurPulse.com.au  Fact Sheet for Healthcare Providers: IncredibleEmployment.be  This test is not yet approved or cleared by the [place] FDA and has been authorized for detection and/or diagnosis of SARS-CoV-2 by FDA under an Emergency Use Authorization (EUA). This EUA will remain in effect (meaning this test can be used) for the duration of the COVID-19 declaration under Section 564(b)(1) of the Act, 21 U.S.C. section 360bbb-3(b)(1), unless the authorization is terminated or revoked.  Performed at [facility], [place] [address], [place], [place] [address]      Medications:    apixaban  5 mg Oral BID   atorvastatin  80 mg Oral Daily   diltiazem  240 mg Oral Daily   escitalopram  10 mg Oral Daily   fentaNYL  1 patch Transdermal Q72H   fentaNYL  1 patch Transdermal Q72H   fentaNYL (SUBLIMAZE) injection  25 mcg Intravenous Q4H   metoprolol tartrate  100 mg Oral BID   nystatin   Topical BID   predniSONE  10 mg Oral Q breakfast   Continuous Infusions:      LOS: 3 days   Charlynne Cousins  [facility]  03/18/2021, 8:01 AM

## 2021-03-18 NOTE — Progress Notes (Signed)
Patient sleeping when RT entered room. Patient's daughter stated she would have RT called if patient decides she wants to wear CPAP tonight. RT will monitor as needed.

## 2021-03-18 NOTE — Care Management Important Message (Signed)
Important Message  Patient Details  Name: Betty Jackson MRN: [id number] Date of Birth: 03-26-1935   Medicare Important Message Given:  Yes     [name] 03/18/2021, 1:36 PM

## 2021-03-19 DIAGNOSIS — R0902 Hypoxemia: Secondary | ICD-10-CM

## 2021-03-19 LAB — RESP PANEL BY RT-PCR (FLU A&B, COVID) ARPGX2
Influenza A by PCR: NEGATIVE
Influenza B by PCR: NEGATIVE
SARS Coronavirus 2 by RT PCR: NEGATIVE

## 2021-03-19 LAB — BASIC METABOLIC PANEL
Anion gap: 4 — ABNORMAL LOW (ref 5–15)
BUN: 10 mg/dL (ref 8–23)
CO2: 27 mmol/L (ref 22–32)
Calcium: 8 mg/dL — ABNORMAL LOW (ref 8.9–10.3)
Chloride: 105 mmol/L (ref 98–111)
Creatinine, Ser: 0.93 mg/dL (ref 0.44–1.00)
GFR, Estimated: >60 mL/min (ref >60)
Glucose, Bld: 88 mg/dL (ref 70–99)
Potassium: 3.6 mmol/L (ref 3.5–5.1)
Sodium: 136 mmol/L (ref 135–145)

## 2021-03-19 MED ORDER — FENTANYL 12 MCG/HR TD PT72: 1.0000 | MEDICATED_PATCH | TRANSDERMAL | 0 refills | Status: DC

## 2021-03-19 MED ORDER — LOSARTAN POTASSIUM 25 MG PO TABS: 25.0000 mg | ORAL_TABLET | Freq: Every day | ORAL | Status: DC

## 2021-03-19 MED ORDER — HYDROCODONE-ACETAMINOPHEN 5-325 MG PO TABS: 1.0000 | ORAL_TABLET | Freq: Two times a day (BID) | ORAL | 0 refills | Status: DC | PRN

## 2021-03-19 MED ORDER — FENTANYL 25 MCG/HR TD PT72: 1.0000 | MEDICATED_PATCH | TRANSDERMAL | 0 refills | Status: DC

## 2021-03-19 NOTE — TOC Progression Note (Signed)
Transition of Care [facility]) - Progression Note    Patient Details  Name: Betty Jackson MRN: [id number] Date of Birth: 10/04/1934  Transition of Care [facility]) CM/SW Contact  Joanne Chars, LCSW Phone Number: 03/19/2021, 11:48 AM  Clinical Narrative:   CSW spoke with Cheri at [facility] and confirmed that they can accept pt today.  Not planning to pursue auth, they do not need FL2, just DC summary and new covid test.      Expected Discharge Plan: [place]    Expected Discharge Plan and Services Expected Discharge Plan: [place]       Living arrangements for the past 2 months: [place]                                       Social Determinants of Health (SDOH) Interventions    Readmission Risk Interventions Readmission Risk Prevention Plan 10/30/2019 09/26/2019 06/21/2019  Transportation Screening Complete Complete Complete  Medication Review Press photographer) Complete Complete Complete  PCP or Specialist appointment within 3-5 days of discharge Complete Complete Not Complete  PCP/Specialist Appt Not Complete comments - - likely SNF placement  HRI or Home Care Consult Complete Complete Not Complete  HRI or Home Care Consult Pt Refusal Comments - - likely SNF  SW Recovery Care/Counseling Consult Complete Complete Complete  Palliative Care Screening Complete Complete Complete  Skilled Nursing Facility Not Applicable Complete Complete  Some recent data might be hidden

## 2021-03-19 NOTE — TOC Transition Note (Signed)
Transition of Care [facility]) - CM/SW Discharge Note   Patient Details  Name: Betty Jackson MRN: [id number] Date of Birth: 12-16-1934  [facility]) CM/SW Contact:  Joanne Chars, LCSW Phone Number: 03/19/2021, 1:43 PM   Clinical Narrative:   Pt discharging to [facility], room 104A.  RN call report to [phone].  PTAR called at 1340.      Final next level of care: Skilled Nursing Facility Barriers to Discharge: Barriers Resolved   Patient Goals and CMS Choice Patient states their goals for this hospitalization and ongoing recovery are:: pt unable to assess CMS Medicare.gov Compare Post Acute Care list provided to:: Patient Choice offered to / list presented to : Patient  Discharge Placement              Patient chooses bed at:  [facility]) Patient to be transferred to facility by: [place] Name of family member notified: [name], daughter Patient and family notified of of transfer: 03/19/21  Discharge Plan and Services                                     Social Determinants of Health (SDOH) Interventions     Readmission Risk Interventions Readmission Risk Prevention Plan 10/30/2019 09/26/2019 06/21/2019  Transportation Screening Complete Complete Complete  Medication Review Press photographer) Complete Complete Complete  PCP or Specialist appointment within 3-5 days of discharge Complete Complete Not Complete  PCP/Specialist Appt Not Complete comments - - likely SNF placement  HRI or Home Care Consult Complete Complete Not Complete  HRI or Home Care Consult Pt Refusal Comments - - likely SNF  SW Recovery Care/Counseling Consult Complete Complete Complete  Palliative Care Screening Complete Complete Complete  Skilled Nursing Facility Not Applicable Complete Complete  Some recent data might be hidden

## 2021-03-19 NOTE — Progress Notes (Signed)
Family decline vital sign for pt at 0400 because pt was sleeping well and had not slept the previous night. Refusal was to promote sleep. Spo2 93 RA HR 88.

## 2021-03-19 NOTE — Discharge Summary (Signed)
Physician Discharge Summary  BRENTNEY GOLDBACH BWG:[id number] DOB: 1934/07/23 DOA: 03/14/2021  PCP: Alvester Morin, MD  Admit date: 03/14/2021 Discharge date: 03/19/2021  Time spent: 60 minutes  Recommendations for Outpatient Follow-up:  Follow-up PCP in 2 weeks Check CBC in 3 days   Discharge Diagnoses:  Active Problems:   Hypertension   PAF (paroxysmal atrial fibrillation) (HCC)   Hypoxemia   AKI (acute kidney injury) ([place])   Acute metabolic encephalopathy   Discharge Condition: Stable  Diet recommendation: Heart healthy diet  Filed Weights   03/16/21 2135  Weight: 76.5 kg    History of present illness:  86 year old female with medical history of hypertension, hyperlipidemia, CAD s/p stent to the RCA in 2016, paroxysmal atrial fibrillation on Eliquis, history of CVA, chronic diastolic heart failure, giant cell arteritis on prednisone, chronic pain syndrome on fentanyl patch, he was sent from skilled nursing facility for altered mental status likely due to polypharmacy in the setting of fentanyl, Lyrica, Seroquel, baclofen and acute kidney injury.  CT head and MRI brain showed no acute findings, EEG was unremarkable.  Dose of fentanyl was initially reduced however patient became agitated so the dose of fentanyl was changed back to her home dose of 37 mcg every 72 hours patch  Hospital Course:   Acute metabolic encephalopathy -Likely from polypharmacy in the setting of baclofen, Seroquel, fentanyl and acute kidney injury -Resolved, baclofen has been discontinued  Hypoxemia -Resolved -Patient takes Lasix at home which was held in the hospital -Lasix has been restarted -Currently she is not requiring oxygen  Acute kidney injury on CKD stage IIIa -Baseline creatinine 1.1, she presented with creatinine of 1.6 -Resolved  Normocytic anemia -Hemoglobin at baseline  Paroxysmal atrial fibrillation -Heart rate controlled -Continue metoprolol, Cardizem -Continue  Eliquis for anticoagulation  Essential hypertension -Continue Cardizem at her home dose -Continue metoprolol, Lasix  Giant cell arteritis -Continue prednisone  Hyperlipidemia -Continue statins  Failure to thrive -Patient is wheelchair-bound  Procedures:   Consultations:   Discharge Exam: Vitals:   03/18/21 2046 03/19/21 0728  BP: (!) 141/57 (!) 160/61  Pulse: 87 70  Resp: 19 17  Temp: 98.1 F (36.7 C) (!) 97.5 F (36.4 C)  SpO2: 91% 90%    General: Appears in no acute distress Cardiovascular: S1-S2, regular, no murmur auscultated Respiratory: Clear to auscultation bilaterally  Discharge Instructions   Discharge Instructions     Diet - low sodium heart healthy   Complete by: As directed    Increase activity slowly   Complete by: As directed       Allergies as of 03/19/2021       Reactions   Dextromethorphan Rash        Medication List     STOP taking these medications    Baclofen 5 MG Tabs   HYDROcodone-acetaminophen 10-325 MG tablet Commonly known as: NORCO Replaced by: HYDROcodone-acetaminophen 5-325 MG tablet       TAKE these medications    acetaminophen 500 MG tablet Commonly known as: TYLENOL Take 500 mg by mouth every 6 (six) hours as needed.   alendronate 70 MG tablet Commonly known as: FOSAMAX Take 1 tablet (70 mg total) by mouth every 7 (seven) days. Take with a full glass of water on an empty stomach.   allopurinol 100 MG tablet Commonly known as: ZYLOPRIM Take 100 mg by mouth daily.   atorvastatin 80 MG tablet Commonly known as: Lipitor Take 1 tablet (80 mg total) by mouth daily.   cetirizine 10 MG  tablet Commonly known as: ZYRTEC Take 10 mg by mouth daily.   diltiazem 240 MG 24 hr capsule Commonly known as: CARDIZEM CD Take 1 capsule (240 mg total) by mouth daily.   Eliquis 5 MG Tabs tablet Generic drug: apixaban TAKE 1 TABLET BY MOUTH TWICE A DAY   escitalopram 10 MG tablet Commonly known as:  LEXAPRO TAKE 1 TABLET BY MOUTH EVERY DAY   fentaNYL 25 MCG/HR Commonly known as: [place] 1 patch onto the skin every 3 (three) days. Start taking on: March 21, 2021 What changed: Another medication with the same name was added. Make sure you understand how and when to take each.   fentaNYL 12 MCG/HR Commonly known as: [place] 1 patch onto the skin every 3 (three) days. Start taking on: March 21, 2021 What changed: You were already taking a medication with the same name, and this prescription was added. Make sure you understand how and when to take each.   ferrous sulfate 325 (65 FE) MG tablet Take 1 tablet (325 mg total) by mouth 2 (two) times daily with a meal.   furosemide 40 MG tablet Commonly known as: LASIX Take 1 tablet (40 mg total) by mouth daily. Please keep upcoming Dr. appt for future refills. Thank you What changed: how much to take   HYDROcodone-acetaminophen 5-325 MG tablet Commonly known as: Norco Take 1 tablet by mouth every 12 (twelve) hours as needed for moderate pain. Replaces: HYDROcodone-acetaminophen 10-325 MG tablet   losartan 25 MG tablet Commonly known as: Cozaar Take 1 tablet (25 mg total) by mouth daily. What changed:  medication strength how much to take   meloxicam 7.5 MG tablet Commonly known as: MOBIC Take 7.5 mg by mouth daily.   metoprolol tartrate 100 MG tablet Commonly known as: LOPRESSOR TAKE 1 TABLET BY MOUTH TWICE A DAY   multivitamin with minerals Tabs tablet Take 1 tablet by mouth daily.   ondansetron 4 MG tablet Commonly known as: ZOFRAN Take 4 mg by mouth every 8 (eight) hours as needed for nausea or vomiting.   potassium chloride 10 MEQ tablet Commonly known as: KLOR-CON M Take 1 tablet (10 mEq total) by mouth daily.   predniSONE 10 MG tablet Commonly known as: DELTASONE Take 1 tablet (10 mg total) by mouth daily with breakfast.   QUEtiapine 25 MG tablet Commonly known as: SEROQUEL Take 1  tablet (25 mg total) by mouth at bedtime. What changed: See the new instructions.   sulfaSALAzine 500 MG EC tablet Commonly known as: AZULFIDINE Take 500 mg by mouth daily.   Vitamin D3 25 MCG tablet Commonly known as: Vitamin D Take 2 tablets (2,000 Units total) by mouth 2 (two) times daily.       Allergies  Allergen Reactions   Dextromethorphan Rash      The results of significant diagnostics from this hospitalization (including imaging, microbiology, ancillary and laboratory) are listed below for reference.    Significant Diagnostic Studies: DG Abd 1 View  Result Date: 03/15/2021 CLINICAL DATA:  Altered mental status EXAM: ABDOMEN - 1 VIEW COMPARISON:  None. FINDINGS: Bowel-gas pattern appears within normal limits. No abnormally distended gas-filled loops of bowel to suggest obstruction. No suspicious calcifications identified. Postsurgical changes in the lumbar spine and lower thoracic spine. Right hip arthroplasty. IMPRESSION: No acute abnormality identified. Electronically Signed   By: Ofilia Neas M.D.   On: 03/15/2021 11:10   CT HEAD WO CONTRAST  Result Date: 03/14/2021 CLINICAL DATA:  Altered mental status  EXAM: CT HEAD WITHOUT CONTRAST TECHNIQUE: Contiguous axial images were obtained from the base of the skull through the vertex without intravenous contrast. COMPARISON:  07/14/2020 FINDINGS: Brain: No evidence of acute infarction, hemorrhage, cerebral edema, mass, mass effect, or midline shift. No hydrocephalus or new extra-axial fluid collection. Likely arachnoid cyst overlying the medial right frontal lobe, unchanged. Periventricular white matter changes, likely the sequela of chronic small vessel ischemic disease. Vascular: No hyperdense vessel. Atherosclerotic calcifications in the intracranial carotid and vertebral arteries. Skull: Normal. Negative for fracture or focal lesion. Sinuses/Orbits: No acute finding. Status post bilateral lens replacements. Other: The  mastoid air cells are well aerated. IMPRESSION: IMPRESSION No acute intracranial process. Electronically Signed   By: Merilyn Baba M.D.   On: 03/14/2021 22:31   MR BRAIN WO CONTRAST  Result Date: 03/15/2021 CLINICAL DATA:  Mental status change, unknown cause, lethargy EXAM: MRI HEAD WITHOUT CONTRAST TECHNIQUE: Multiplanar, multiecho pulse sequences of the brain and surrounding structures were obtained without intravenous contrast. COMPARISON:  07/14/2020, correlation is also made with 03/14/2021 CT head FINDINGS: Brain: No restricted diffusion to suggest acute or subacute infarct. No acute hemorrhage, mass, mass effect, or midline shift. Confluent T2 hyperintense signal in the periventricular white matter, likely the sequela of severe chronic small vessel ischemic disease. No hydrocephalus or extra-axial collection. No foci hemosiderin deposition to suggest remote hemorrhage. Vascular: Normal flow voids. Skull and upper cervical spine: Normal marrow signal. Sinuses/Orbits: Negative.  Status post bilateral lens replacements. Other: Trace fluid in left greater than right mastoid air cells. IMPRESSION: No acute intracranial process. No etiology is seen for the patient's mental status change. Electronically Signed   By: Merilyn Baba M.D.   On: 03/15/2021 01:03   DG Chest Port 1 View  Result Date: 03/14/2021 CLINICAL DATA:  Altered mental status. EXAM: PORTABLE CHEST 1 VIEW COMPARISON:  July 16, 2020 FINDINGS: Decreased lung volumes are seen. Mild, stable atelectasis is noted within the bilateral lung bases. There is no evidence of a pleural effusion or pneumothorax. The heart size and mediastinal contours are within normal limits. Degenerative changes are seen throughout the thoracic spine. IMPRESSION: Decreased lung volumes with mild bibasilar atelectasis. Electronically Signed   By: Virgina Norfolk M.D.   On: 03/14/2021 22:28   EEG adult  Result Date: 03/15/2021 Lora Havens, MD     03/15/2021   3:54 PM Patient Name: DEANIA SIGUENZA MRN: [id number] Epilepsy Attending: Lora Havens Referring Physician/Provider: Dr Florencia Reasons Date: 03/15/2021 Duration: 21.11 mins Patient history: 86yo F with ams. EEG to evaluate for seizure Level of alertness: Awake, asleep AEDs during EEG study: None Technical aspects: This EEG study was done with scalp electrodes positioned according to the 10-20 International system of electrode placement. Electrical activity was acquired at a sampling rate of 500Hz  and reviewed with a high frequency filter of 70Hz  and a low frequency filter of 1Hz . EEG data were recorded continuously and digitally stored. Description: The posterior dominant rhythm consists of 8 Hz activity of moderate voltage (25-35 uV) seen predominantly in posterior head regions, symmetric and reactive to eye opening and eye closing. Sleep was characterized by vertex waves, sleep spindles (12 to 14 Hz), maximal frontocentral region. EEG showed intermittent 3 to 6 Hz theta-delta slowing. Hyperventilation and photic stimulation were not performed.   ABNORMALITY - Intermittent slow, generalized IMPRESSION: This study is suggestive of mild diffuse encephalopathy, nonspecific etiology. No seizures or epileptiform discharges were seen throughout the recording. Priyanka Barbra Sarks  Microbiology: Recent Results (from the past 240 hour(s))  Resp Panel by RT-PCR (Flu A&B, Covid) Nasopharyngeal Swab     Status: None   Collection Time: 03/14/21  9:46 PM   Specimen: Nasopharyngeal Swab; Nasopharyngeal(NP) swabs in vial transport medium  Result Value Ref Range Status   SARS Coronavirus 2 by RT PCR NEGATIVE NEGATIVE Final    Comment: (NOTE) SARS-CoV-2 target nucleic acids are NOT DETECTED.  The SARS-CoV-2 RNA is generally detectable in upper respiratory specimens during the acute phase of infection. The lowest concentration of SARS-CoV-2 viral copies this assay can detect is 138 copies/mL. A negative result does not  preclude SARS-Cov-2 infection and should not be used as the sole basis for treatment or other patient management decisions. A negative result may occur with  improper specimen collection/handling, submission of specimen other than nasopharyngeal swab, presence of viral mutation(s) within the areas targeted by this assay, and inadequate number of viral copies(<138 copies/mL). A negative result must be combined with clinical observations, patient history, and epidemiological information. The expected result is Negative.  Fact Sheet for Patients:  EntrepreneurPulse.com.au  Fact Sheet for Healthcare Providers:  IncredibleEmployment.be  This test is no t yet approved or cleared by the [place] FDA and  has been authorized for detection and/or diagnosis of SARS-CoV-2 by FDA under an Emergency Use Authorization (EUA). This EUA will remain  in effect (meaning this test can be used) for the duration of the COVID-19 declaration under Section 564(b)(1) of the Act, 21 U.S.C.section 360bbb-3(b)(1), unless the authorization is terminated  or revoked sooner.       Influenza A by PCR NEGATIVE NEGATIVE Final   Influenza B by PCR NEGATIVE NEGATIVE Final    Comment: (NOTE) The Xpert Xpress SARS-CoV-2/FLU/RSV plus assay is intended as an aid in the diagnosis of influenza from Nasopharyngeal swab specimens and should not be used as a sole basis for treatment. Nasal washings and aspirates are unacceptable for Xpert Xpress SARS-CoV-2/FLU/RSV testing.  Fact Sheet for Patients: EntrepreneurPulse.com.au  Fact Sheet for Healthcare Providers: IncredibleEmployment.be  This test is not yet approved or cleared by the [place] FDA and has been authorized for detection and/or diagnosis of SARS-CoV-2 by FDA under an Emergency Use Authorization (EUA). This EUA will remain in effect (meaning this test can be used) for the  duration of the COVID-19 declaration under Section 564(b)(1) of the Act, 21 U.S.C. section 360bbb-3(b)(1), unless the authorization is terminated or revoked.  Performed at [facility], [place] [address], [place], [place] [address]   Resp Panel by RT-PCR (Flu A&B, Covid) Nasopharyngeal Swab     Status: None   Collection Time: 03/17/21 12:41 PM   Specimen: Nasopharyngeal Swab; Nasopharyngeal(NP) swabs in vial transport medium  Result Value Ref Range Status   SARS Coronavirus 2 by RT PCR NEGATIVE NEGATIVE Final    Comment: (NOTE) SARS-CoV-2 target nucleic acids are NOT DETECTED.  The SARS-CoV-2 RNA is generally detectable in upper respiratory specimens during the acute phase of infection. The lowest concentration of SARS-CoV-2 viral copies this assay can detect is 138 copies/mL. A negative result does not preclude SARS-Cov-2 infection and should not be used as the sole basis for treatment or other patient management decisions. A negative result may occur with  improper specimen collection/handling, submission of specimen other than nasopharyngeal swab, presence of viral mutation(s) within the areas targeted by this assay, and inadequate number of viral copies(<138 copies/mL). A negative result must be combined with clinical observations, patient history, and  epidemiological information. The expected result is Negative.  Fact Sheet for Patients:  EntrepreneurPulse.com.au  Fact Sheet for Healthcare Providers:  IncredibleEmployment.be  This test is no t yet approved or cleared by the [place] FDA and  has been authorized for detection and/or diagnosis of SARS-CoV-2 by FDA under an Emergency Use Authorization (EUA). This EUA will remain  in effect (meaning this test can be used) for the duration of the COVID-19 declaration under Section 564(b)(1) of the Act, 21 U.S.C.section 360bbb-3(b)(1), unless the authorization is terminated  or  revoked sooner.       Influenza A by PCR NEGATIVE NEGATIVE Final   Influenza B by PCR NEGATIVE NEGATIVE Final    Comment: (NOTE) The Xpert Xpress SARS-CoV-2/FLU/RSV plus assay is intended as an aid in the diagnosis of influenza from Nasopharyngeal swab specimens and should not be used as a sole basis for treatment. Nasal washings and aspirates are unacceptable for Xpert Xpress SARS-CoV-2/FLU/RSV testing.  Fact Sheet for Patients: EntrepreneurPulse.com.au  Fact Sheet for Healthcare Providers: IncredibleEmployment.be  This test is not yet approved or cleared by the [place] FDA and has been authorized for detection and/or diagnosis of SARS-CoV-2 by FDA under an Emergency Use Authorization (EUA). This EUA will remain in effect (meaning this test can be used) for the duration of the COVID-19 declaration under Section 564(b)(1) of the Act, 21 U.S.C. section 360bbb-3(b)(1), unless the authorization is terminated or revoked.  Performed at [facility], [place] [address] [place], [place] [address]   Resp Panel by RT-PCR (Flu A&B, Covid) Nasopharyngeal Swab     Status: None   Collection Time: 03/19/21 11:38 AM   Specimen: Nasopharyngeal Swab; Nasopharyngeal(NP) swabs in vial transport medium  Result Value Ref Range Status   SARS Coronavirus 2 by RT PCR NEGATIVE NEGATIVE Final    Comment: (NOTE) SARS-CoV-2 target nucleic acids are NOT DETECTED.  The SARS-CoV-2 RNA is generally detectable in upper respiratory specimens during the acute phase of infection. The lowest concentration of SARS-CoV-2 viral copies this assay can detect is 138 copies/mL. A negative result does not preclude SARS-Cov-2 infection and should not be used as the sole basis for treatment or other patient management decisions. A negative result may occur with  improper specimen collection/handling, submission of specimen other than nasopharyngeal swab, presence of viral  mutation(s) within the areas targeted by this assay, and inadequate number of viral copies(<138 copies/mL). A negative result must be combined with clinical observations, patient history, and epidemiological information. The expected result is Negative.  Fact Sheet for Patients:  EntrepreneurPulse.com.au  Fact Sheet for Healthcare Providers:  IncredibleEmployment.be  This test is no t yet approved or cleared by the [place] FDA and  has been authorized for detection and/or diagnosis of SARS-CoV-2 by FDA under an Emergency Use Authorization (EUA). This EUA will remain  in effect (meaning this test can be used) for the duration of the COVID-19 declaration under Section 564(b)(1) of the Act, 21 U.S.C.section 360bbb-3(b)(1), unless the authorization is terminated  or revoked sooner.       Influenza A by PCR NEGATIVE NEGATIVE Final   Influenza B by PCR NEGATIVE NEGATIVE Final    Comment: (NOTE) The Xpert Xpress SARS-CoV-2/FLU/RSV plus assay is intended as an aid in the diagnosis of influenza from Nasopharyngeal swab specimens and should not be used as a sole basis for treatment. Nasal washings and aspirates are unacceptable for Xpert Xpress SARS-CoV-2/FLU/RSV testing.  Fact Sheet for Patients: EntrepreneurPulse.com.au  Fact Sheet for Healthcare  Providers: IncredibleEmployment.be  This test is not yet approved or cleared by the [place] and has been authorized for detection and/or diagnosis of SARS-CoV-2 by FDA under an Emergency Use Authorization (EUA). This EUA will remain in effect (meaning this test can be used) for the duration of the COVID-19 declaration under Section 564(b)(1) of the Act, 21 U.S.C. section 360bbb-3(b)(1), unless the authorization is terminated or revoked.  Performed at [facility], [place] [address] [place], [place] [address]      Labs: Basic Metabolic  Panel: Recent Labs  Lab 03/15/21 0159 03/16/21 0201 03/17/21 0232 03/18/21 0418 03/19/21 0340  NA 139 139 138 141 136  K 3.9 4.4 4.5 4.7 3.6  CL 104 103 105 108 105  CO2 25 26 28 25 27   GLUCOSE 98 80 94 89 88  BUN 16 16 16 15 10   CREATININE 1.17* 1.56* 1.20* 1.17* 0.93  CALCIUM 8.5* 8.3* 8.1* 8.2* 8.0*  MG  --  1.7  --   --   --    Liver Function Tests: Recent Labs  Lab 03/14/21 2145  AST 24  ALT 17  ALKPHOS 72  BILITOT 0.9  PROT 5.7*  ALBUMIN 3.0*   No results for input(s): LIPASE, AMYLASE in the last 168 hours. Recent Labs  Lab 03/14/21 2145  AMMONIA 13   CBC: Recent Labs  Lab 03/14/21 2145 03/14/21 2355 03/15/21 0146 03/15/21 0159 03/16/21 0201 03/17/21 0232  WBC 12.6*  --   --  10.7* 9.1 8.3  NEUTROABS 9.7*  --   --   --  6.6 6.4  HGB 11.1* 11.2* 10.5* 10.5* 9.7* 8.3*  HCT 35.9* 33.0* 31.0* 34.7* 31.5* 27.1*  MCV 98.6  --   --  99.1 98.1 96.4  PLT 194  --   --  163 175 171   Cardiac Enzymes: No results for input(s): CKTOTAL, CKMB, CKMBINDEX, TROPONINI in the last 168 hours. BNP: BNP (last 3 results) Recent Labs    07/16/20 2239  BNP 96.2    ProBNP (last 3 results) No results for input(s): PROBNP in the last 8760 hours.  CBG: Recent Labs  Lab 03/14/21 2155  GLUCAP 105*       Signed:  Oswald Hillock MD.  [facility] 03/19/2021, 1:02 PM

## 2021-03-19 NOTE — Progress Notes (Signed)
Physical Therapy Treatment Patient Details Name: Betty Jackson MRN: [id number] DOB: [birth year] Today's Date: 03/19/2021   History of Present Illness 86 y.o. 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 MRI brain: No acute intracranial process, no etiology is seen for patient's mental status change.    PT Comments    Received pt in bed with call light going off requesting to "go home". Pt stated "the ambulance is coming any minute to get me". With encouragement pt agreed to stand so that therapist could remove soiled chuck pad and replace with clean one. Pt performed bed mobility with mod A overall and required min/CGA for static sitting balance due to R lateral lean. Stood from EOB with RW and mod A. Pt unable to stand >5 seconds and requested to lie back down. Pt left with all needs within reach and bed alarm on. Acute PT to cont to follow.    Recommendations for follow up therapy are one component of a multi-disciplinary discharge planning process, led by the attending physician.  Recommendations may be updated based on patient status, additional functional criteria and insurance authorization.  Follow Up Recommendations  Skilled nursing-short term rehab (<3 hours/day)     Assistance Recommended at Discharge Frequent or constant Supervision/Assistance  Equipment Recommendations  Rolling walker (2 wheels)    Recommendations for Other Services       Precautions / Restrictions Precautions Precautions: Fall Restrictions Weight Bearing Restrictions: No     Mobility  Bed Mobility Overal bed mobility: Needs Assistance Bed Mobility: Supine to Sit;Sit to Supine     Supine to sit: Mod assist;HOB elevated Sit to supine:  Min assist   General bed mobility comments: HOB elevated and handheld assist, difficulty sequencing    Transfers Overall transfer level: Needs assistance Equipment used: Rolling walker (2 wheels) Transfers: Sit to/from Stand Sit to Stand: Mod assist           General transfer comment: required CGA/min A for static sitting balance, frequently leaning towards R. Cues for scooting to EOB in preparation to stand    Ambulation/Gait                   Stairs             Wheelchair Mobility    Modified Rankin (Stroke Patients Only)       Balance Overall balance assessment: Needs assistance Sitting-balance support: Bilateral upper extremity supported;Feet supported Sitting balance-[name]: Poor Sitting balance - Comments: required CGA/min A for static sitting balance; R lateral lean Postural control: Right lateral lean Standing balance support: Bilateral upper extremity supported;Reliant on assistive device for balance (RW) Standing balance-[name]: Poor Standing balance comment: remained standing for <5 seconds prior to sitting                            Cognition Arousal/Alertness: Awake/alert Behavior During Therapy: Restless (ready to discharge) Overall Cognitive Status: No family/caregiver present to determine baseline cognitive functioning Area of Impairment: Attention;Memory;Safety/judgement;Awareness;Problem solving                         Safety/Judgement: Decreased awareness of safety   Problem Solving: Slow processing;Difficulty  sequencing          Exercises      General Comments General comments (skin integrity, edema, etc.): O2 dropped to 80% when coughing, but quickly returned to >90% with cues for breathing      Pertinent Vitals/Pain Pain Assessment: No/denies pain    Home Living                          Prior Function            PT Goals (current goals can now be found in the care plan  section) Acute Rehab PT Goals Patient Stated Goal: not stated PT Goal Formulation: With patient Time For Goal Achievement: 03/30/21 Potential to Achieve Goals: Good Progress towards PT goals: Progressing toward goals    Frequency    Min 2X/week      PT Plan      Co-evaluation              AM-PAC PT "6 Clicks" Mobility   Outcome Measure  Help needed turning from your back to your side while in a flat bed without using bedrails?: A Lot Help needed moving from lying on your back to sitting on the side of a flat bed without using bedrails?: A Lot Help needed moving to and from a bed to a chair (including a wheelchair)?: A Lot Help needed standing up from a chair using your arms (e.g., wheelchair or bedside chair)?: A Lot Help needed to walk in hospital room?: Total Help needed climbing 3-5 steps with a railing? : Total 6 Click Score: 10    End of Session Equipment Utilized During Treatment: Gait belt Activity Tolerance: Patient tolerated treatment well;Patient limited by fatigue Patient left: in bed;with call bell/phone within reach;with bed alarm set Nurse Communication: Mobility status PT Visit Diagnosis: Muscle weakness (generalized) (M62.81);Other abnormalities of gait and mobility (R26.89);Unsteadiness on feet (R26.81)     Time: 0539-7673 PT Time Calculation (min) (ACUTE ONLY): 20 min  Charges:  $Therapeutic Activity: 8-22 mins (20 min)                     Becky Sax PT, DPT  Blenda Nicely 03/19/2021, 2:39 PM

## 2021-03-19 NOTE — Progress Notes (Signed)
Report given to Production assistant, radio at [facility]

## 2021-04-22 ENCOUNTER — Telehealth: Payer: Self-pay | Admitting: *Deleted

## 2021-04-22 NOTE — Telephone Encounter (Signed)
° °  Pre-operative Risk Assessment    Patient Name: Betty Jackson  DOB: 06/05/1934 MRN: [id number]     Request for Surgical Clearance    Procedure:  Dental Extraction - Amount of Teeth to be Pulled:  9, remove bilateral mandibular lingual tori  Date of Surgery:  Clearance TBD                                 Surgeon:  Diona Browner DMD, PA Surgeon's Group or Practice Name:  [facility] Phone number:  [phone] Fax number:  [phone]   Type of Clearance Requested:   - Pharmacy:  Hold Apixaban (Eliquis)     Type of Anesthesia:  Local /Nitrous   Additional requests/questions:  Does this patient need antibiotics?  Gillermina Phy   04/22/2021, 3:58 PM

## 2021-04-23 NOTE — Telephone Encounter (Signed)
Patient with diagnosis of afib on Eliquis for anticoagulation.    Procedure: 9 dental extractions and removal of bilateral mandibular lingual tori Date of procedure: TBD  CHA2DS2-VASc Score = 6  This indicates a 9.7% annual risk of stroke. 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   CrCl 29mL/min using adjusted body weight Platelet count 171K  Patient does not require pre-op antibiotics for dental procedure.  Per office protocol, patient can hold Eliquis for 2 days prior to procedure.

## 2021-04-23 NOTE — Telephone Encounter (Signed)
° °  Name: Betty Jackson  DOB: [birth year]  MRN: [id number]   Primary Cardiologist: [name], MD  Chart reviewed as part of pre-operative protocol coverage. We have been asked for guidance to hold eliquis for 9 dental extractions.  Patient with diagnosis of afib on Eliquis for anticoagulation.     Procedure: 9 dental extractions and removal of bilateral mandibular lingual tori Date of procedure: TBD   CHA2DS2-VASc Score = 6  This indicates a 9.7% annual risk of stroke. 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    CrCl 71mL/min using adjusted body weight Platelet count 171K   Patient does not require pre-op antibiotics for dental procedure.   Per office protocol, patient can hold Eliquis for 2 days prior to procedure.      I will route this recommendation to the requesting party via Epic fax function and remove from pre-op pool. Please call with questions.  Tami Lin Yara Tomkinson, PA 04/23/2021, 3:19 PM

## 2021-04-30 ENCOUNTER — Other Ambulatory Visit: Payer: Self-pay

## 2021-04-30 ENCOUNTER — Emergency Department (HOSPITAL_COMMUNITY): Payer: Medicare Other

## 2021-04-30 ENCOUNTER — Encounter (HOSPITAL_COMMUNITY): Payer: Self-pay

## 2021-04-30 ENCOUNTER — Emergency Department (HOSPITAL_COMMUNITY)
Admission: EM | Admit: 2021-04-30 | Discharge: 2021-05-01 | Disposition: A | Discharge status: Skilled Nursing Facility | Payer: Medicare Other | Attending: Emergency Medicine | Admitting: Emergency Medicine

## 2021-04-30 DIAGNOSIS — M25552 Pain in left hip: Secondary | ICD-10-CM

## 2021-04-30 DIAGNOSIS — W1839XA Other fall on same level, initial encounter: Secondary | ICD-10-CM | POA: Diagnosis not present

## 2021-04-30 DIAGNOSIS — Y92129 Unspecified place in nursing home as the place of occurrence of the external cause: Secondary | ICD-10-CM | POA: Diagnosis not present

## 2021-04-30 DIAGNOSIS — Z79899 Other long term (current) drug therapy: Secondary | ICD-10-CM | POA: Diagnosis not present

## 2021-04-30 DIAGNOSIS — R29818 Other symptoms and signs involving the nervous system: Secondary | ICD-10-CM | POA: Insufficient documentation

## 2021-04-30 DIAGNOSIS — W19XXXA Unspecified fall, initial encounter: Secondary | ICD-10-CM

## 2021-04-30 DIAGNOSIS — Z7901 Long term (current) use of anticoagulants: Secondary | ICD-10-CM | POA: Diagnosis not present

## 2021-04-30 DIAGNOSIS — H1131 Conjunctival hemorrhage, right eye: Secondary | ICD-10-CM | POA: Diagnosis not present

## 2021-04-30 DIAGNOSIS — S79912A Unspecified injury of left hip, initial encounter: Secondary | ICD-10-CM | POA: Insufficient documentation

## 2021-04-30 NOTE — ED Provider Notes (Signed)
[facility] EMERGENCY DEPARTMENT Provider Note   CSN: [id number] Arrival date & time: 04/30/21  2117     History  Chief Complaint  Patient presents with   Hip Pain    Betty Jackson is a 87 y.o. female brought in by EMS.  History is gathered from the patient and her daughter who was at bedside.  Daughter reports that nursing staff at her skilled nursing facility were concerned because her blood pressure was markedly elevated and she had some conjunctival hemorrhage under the right eye.  They measured her blood pressure at over 277 systolic.  Both EMS and our machine here have shown her to be normotensive. Patient is also complaining of pain in her left hip.  She had a fall about 12 days ago.  She did not hit her head.  She has had pain whenever she tries to bend the hip.  She is able to transfer out of her wheelchair.  She has no numbness or tingling, she has a history of chronic back pain, multiple previous surgeries and is on Duragesic patch and takes Norco as needed for breakthrough pain. Patient reports that she had a bad headache all weekend however she has no headache at this time and denies hitting her head.  She is on Eliquis for chronic A. fib.   Hip Pain      Home Medications Prior to Admission medications   Medication Sig Start Date End Date Taking? Authorizing Provider  acetaminophen (TYLENOL) 500 MG tablet Take 500 mg by mouth every 6 (six) hours as needed.    [provider]  alendronate (FOSAMAX) 70 MG tablet Take 1 tablet (70 mg total) by mouth every 7 (seven) days. Take with a full glass of water on an empty stomach. 06/14/20   Burchette, Alinda Sierras, MD  allopurinol (ZYLOPRIM) 100 MG tablet Take 100 mg by mouth daily.    [provider]  atorvastatin (LIPITOR) 80 MG tablet Take 1 tablet (80 mg total) by mouth daily. 09/25/20   Garvin Fila, MD  cetirizine (ZYRTEC) 10 MG tablet Take 10 mg by mouth daily.    [provider]   cholecalciferol (VITAMIN D) 25 MCG tablet Take 2 tablets (2,000 Units total) by mouth 2 (two) times daily. Patient not taking: Reported on 03/15/2021 09/29/19   Ainsley Spinner, PA-C  diltiazem (CARDIZEM CD) 240 MG 24 hr capsule Take 1 capsule (240 mg total) by mouth daily. 03/28/20   Burnell Blanks, MD  ELIQUIS 5 MG TABS tablet TAKE 1 TABLET BY MOUTH TWICE A DAY 05/09/20   Burnell Blanks, MD  escitalopram (LEXAPRO) 10 MG tablet TAKE 1 TABLET BY MOUTH EVERY DAY Patient taking differently: Take 10 mg by mouth daily. 08/27/20   Burchette, Alinda Sierras, MD  fentaNYL (DURAGESIC) 12 MCG/HR Place 1 patch onto the skin every 3 (three) days. 03/21/21   Oswald Hillock, MD  fentaNYL (DURAGESIC) 25 MCG/HR Place 1 patch onto the skin every 3 (three) days. 03/21/21   Oswald Hillock, MD  ferrous sulfate 325 (65 FE) MG tablet Take 1 tablet (325 mg total) by mouth 2 (two) times daily with a meal. Patient not taking: Reported on 03/15/2021 04/21/19   Oswald Hillock, MD  furosemide (LASIX) 40 MG tablet Take 1 tablet (40 mg total) by mouth daily. Please keep upcoming Dr. appt for future refills. Thank you Patient taking differently: Take 60 mg by mouth daily. Please keep upcoming Dr. appt for future refills.  Thank you 11/01/20   Leanor Kail, PA  HYDROcodone-acetaminophen (NORCO) 5-325 MG tablet Take 1 tablet by mouth every 12 (twelve) hours as needed for moderate pain. 03/19/21   Oswald Hillock, MD  losartan (COZAAR) 25 MG tablet Take 1 tablet (25 mg total) by mouth daily. 03/19/21 03/19/22  Oswald Hillock, MD  meloxicam (MOBIC) 7.5 MG tablet Take 7.5 mg by mouth daily.    [provider]  metoprolol tartrate (LOPRESSOR) 100 MG tablet TAKE 1 TABLET BY MOUTH TWICE A DAY 08/28/20   Burnell Blanks, MD  Multiple Vitamin (MULTIVITAMIN WITH MINERALS) TABS tablet Take 1 tablet by mouth daily. 10/21/19   Ainsley Spinner, PA-C  ondansetron (ZOFRAN) 4 MG tablet Take 4 mg by mouth every 8 (eight) hours as  needed for nausea or vomiting.    [provider]  potassium chloride (KLOR-CON) 10 MEQ tablet Take 1 tablet (10 mEq total) by mouth daily. 06/07/20   Burchette, Alinda Sierras, MD  predniSONE (DELTASONE) 10 MG tablet Take 1 tablet (10 mg total) by mouth daily with breakfast. 10/30/19   Charlynne Cousins, MD  QUEtiapine (SEROQUEL) 25 MG tablet Take 1 tablet (25 mg total) by mouth at bedtime. 03/16/21 04/15/21  Darliss Cheney, MD  sulfaSALAzine (AZULFIDINE) 500 MG EC tablet Take 500 mg by mouth daily.    [provider]      Allergies    Dextromethorphan    Review of Systems   Review of Systems Per HPI Physical Exam Updated Vital Signs BP (!) 135/113 (BP Location: Right Arm)    Pulse (!) 55    Temp 97.9 F (36.6 C) (Oral)    Resp 14    Ht 5' 4.5" (1.638 m)    Wt 76.2 kg    SpO2 94%    BMI 28.39 kg/m  Physical Exam Vitals and nursing note reviewed.  Constitutional:      General: She is not in acute distress.    Appearance: She is well-developed. She is not diaphoretic.  HENT:     Head: Normocephalic and atraumatic.     Right Ear: External ear normal.     Left Ear: External ear normal.     Nose: Nose normal.     Mouth/Throat:     Mouth: Mucous membranes are moist.  Eyes:     General: No scleral icterus.    Extraocular Movements: Extraocular movements intact.     Conjunctiva/sclera: Conjunctivae normal.     Pupils: Pupils are equal, round, and reactive to light.     Comments: Right eye with some conjunctival hemorrhage on the lateral aspect, vision grossly intact, EOM without pain, no obvious signs of trauma  Cardiovascular:     Rate and Rhythm: Normal rate and regular rhythm.     Heart sounds: Normal heart sounds. No murmur heard.   No friction rub. No gallop.  Pulmonary:     Effort: Pulmonary effort is normal. No respiratory distress.     Breath sounds: Normal breath sounds.  Abdominal:     General: Bowel sounds are normal. There is no distension.     Palpations:  Abdomen is soft. There is no mass.     Tenderness: There is no abdominal tenderness. There is no guarding.  Musculoskeletal:     Cervical back: Normal range of motion.     Comments: No pain in the hip with palpation or passive range of motion of the left hip, no tenderness along the midline of the lumbar spine or  pain with pressure on the pelvis.  Skin:    General: Skin is warm and dry.  Neurological:     General: No focal deficit present.     Mental Status: She is alert and oriented to person, place, and time.     Cranial Nerves: No cranial nerve deficit.     Sensory: No sensory deficit.     Motor: No weakness.     Coordination: Coordination normal.     Gait: Gait normal.     Deep Tendon Reflexes: Reflexes normal.  Psychiatric:        Behavior: Behavior normal.    ED Results / Procedures / Treatments   Labs (all labs ordered are listed, but only abnormal results are displayed) Labs Reviewed - No data to display  EKG None  Radiology No results found.  Procedures Procedures    Medications Ordered in ED Medications - No data to display  ED Course/ Medical Decision Making/ A&P                           Medical Decision Making Patient here with multiple complaints.  I reviewed patient's images including left hip, lumbar spine and head CT images all of which are negative for acute finding.  Patient does have some arthritis on the x-ray.  I independently reviewed and interpreted these x-rays.  This is likely exacerbation of the arthritis from fall several days ago.  She is at a skilled nursing facility and can have her pain managed there.  Patient appears appropriate for discharge at this time  Amount and/or Complexity of Data Reviewed Radiology: ordered.  Risk Prescription drug management.    Final Clinical Impression(s) / ED Diagnoses Final diagnoses:  Fall    Rx / DC Orders ED Discharge Orders     None         Margarita Mail, PA-C 05/01/21 0454     Fatima Blank, MD 05/01/21 [phone]

## 2021-04-30 NOTE — ED Triage Notes (Signed)
Pt BIB [facility] from facility after a call for hypertension. Upon arrival EMS assessed a manual BP of 130/58. Pt has right conjunctival hemorrhage which the daughter reported yesterday morning. Patient reported 4/10 L hip pain, worse since yesterday. NAD, A&Ox4. HR 50, 94%, RR 18, CBG 194.

## 2021-05-01 DIAGNOSIS — S79912A Unspecified injury of left hip, initial encounter: Secondary | ICD-10-CM | POA: Diagnosis not present

## 2021-05-01 MED ORDER — HYDROCODONE-ACETAMINOPHEN 5-325 MG PO TABS
1.0000 | ORAL_TABLET | Freq: Once | ORAL | Status: AC
  Administered 2021-05-01: 1 via ORAL
  Filled 2021-05-01: qty 1

## 2021-05-01 MED ORDER — QUETIAPINE FUMARATE 25 MG PO TABS
25.0000 mg | ORAL_TABLET | Freq: Every day | ORAL | Status: DC
  Administered 2021-05-01: 25 mg via ORAL
  Filled 2021-05-01 (×2): qty 1

## 2021-05-01 NOTE — Discharge Instructions (Signed)
Your images are negative for any fracture or acute findings. Please discuss need for increased pain control with your doctor. Return for any new or worsening symptoms.

## 2021-05-07 ENCOUNTER — Ambulatory Visit: Payer: Medicare Other | Admitting: Neurology

## 2021-05-21 ENCOUNTER — Other Ambulatory Visit: Payer: Self-pay

## 2021-05-21 ENCOUNTER — Ambulatory Visit (INDEPENDENT_AMBULATORY_CARE_PROVIDER_SITE_OTHER): Payer: Medicare Other | Admitting: Cardiovascular Disease

## 2021-05-21 ENCOUNTER — Encounter: Payer: Self-pay | Admitting: Cardiovascular Disease

## 2021-05-21 VITALS — BP 122/64 | HR 50 | Ht 64.5 in | Wt 169.0 lb

## 2021-05-21 DIAGNOSIS — E785 Hyperlipidemia, unspecified: Secondary | ICD-10-CM | POA: Diagnosis not present

## 2021-05-21 DIAGNOSIS — Z01810 Encounter for preprocedural cardiovascular examination: Secondary | ICD-10-CM

## 2021-05-21 DIAGNOSIS — I25118 Atherosclerotic heart disease of native coronary artery with other forms of angina pectoris: Secondary | ICD-10-CM

## 2021-05-21 DIAGNOSIS — I10 Essential (primary) hypertension: Secondary | ICD-10-CM

## 2021-05-21 DIAGNOSIS — I5032 Chronic diastolic (congestive) heart failure: Secondary | ICD-10-CM

## 2021-05-21 DIAGNOSIS — I480 Paroxysmal atrial fibrillation: Secondary | ICD-10-CM

## 2021-05-21 NOTE — Progress Notes (Signed)
Chief Complaint  Patient presents with   Follow-up    CAD   History of Present Illness: 87 yo female with history of paroxysmal atrial fibrillation, PVCs, PACs, CAD s/p stent RCA, HTN, HLD, GERD, temporal arteritis, pericardial effusion s/p pericardial window here today for cardiac follow up. She had a stent placed in the RCA in [place], [place] in 2005. She moved to [facility] for treatment of her temporal arteritis at [facility] and to be near family. She had normal ABI in 2012. Mild carotid disease by dopplers 2014. Cardiac cath 12/09/12 with patent RCA stent, mild disease LAD and Circumflex, normal filling pressures. Echo August 2014 with normal LV size and function, no significant valve issues. She was seen in our office 06/19/14 with c/o chest pain. Stress myoview 06/29/14 with no ischemia. She was admitted to [facility] in February 2019 with influenza A, pneumonia and sepsis. Her beta blocker was held and she developed atrial fibrillation with RVR. She also had frequent PVCs and PACs. She converted back to sinus after several hours. Echo February 2019 with normal LV size and function, grade 2 diastolic dysfunction, mild AI. She was not discharged on anticoagulation. She wore a cardiac monitor and had recurrent atrial fib. CHADS VASC score 4. She was seen by Estella Husk, PA 07/06/17 and she was started on Eliquis. I saw her in May 2019 and she c/o chest pain with exertion. Nuclear stress test 08/31/17 with no ischemia. She was having minor nose bleeds on Eliquis. She was referred to ENT but she did not go to that appointment. I saw her 09/20/17 and she c/o weight gain and worsened LE edema. Lasix was increased and Norvasc was stopped. She was admitted to [facility] in December 2020 with a large pericardial effusion and had a sub-xiphoid window per Dr. Prescott Gum. The effusion was culture negative and pathology only showed inflammation. She was admitted to [facility] in January 2021 with a left ischium fracture. No surgery was performed.  Troponin was elevated in the setting of possible sepsis but blood cultures were negative. Cardiology was consulted. Echo January 2021 with normal LV systolic function, no significant valve disease.   She is here today for follow up. The patient denies any chest pain, dyspnea, palpitations, lower extremity edema, orthopnea, PND, dizziness, near syncope or syncope. She is now living in a SNF. Overall doing well. She has to have teeth pulled soon.   Primary Care Physician: Alvester Morin, MD   Past Medical History:  Diagnosis Date   Acute respiratory failure with hypoxia ([place]) 05/20/2017   Anemia, unspecified 10/28/2012   Anxiety state, unspecified 10/28/2012   CAD (coronary artery disease)    a. Stent RCA in [facility];  b. Cath approx 2009 - nonobs per pt report.   Cataract    immature on the left eye   Chronic insomnia 02/07/2013   Chronic lower back pain    scoliosis   CKD (chronic kidney disease) stage 3, GFR 30-59 ml/min (HCC)    CVA (cerebral infarction) 10/29/2012   DDD (degenerative disc disease)    Diverticulosis    GERD (gastroesophageal reflux disease) 09/01/2010   Hemorrhoids    Herniated nucleus pulposus, L5-S1, right 11/04/2015   Hyperlipidemia    takes Lipitor daily   Hypertension    takes Amlodipine,Losartan,Metoprolol,and HCTZ daily   Incontinence of urine    Insomnia    takes Restoril nightly   Lumbar stenosis 04/25/2013   Major depressive disorder, recurrent episode, moderate ([place]) 07/16/2013  Osteoporosis    PAF (paroxysmal atrial fibrillation) ([place]) 2011   a. lone epidode in 2011 according to notes.   Rheumatoid arthritis ([place])    Scoliosis    Sepsis ([place]) 05/2019   Stroke ([place])    Temporal arteritis ([place]) 2011   a. followed @ [place]; potential flareup 10/28/2012/notes 10/28/2012   Vocal cord dysfunction    "they don't operate properly" (10/28/2012)    Past Surgical History:  Procedure Laterality Date   ABDOMINAL HYSTERECTOMY  ~ 1984    vaginally   BACK SURGERY  7-88yrs ago   X Stop   BLADDER SUSPENSION  2001   BREAST BIOPSY Right    CATARACT EXTRACTION W/ INTRAOCULAR LENS IMPLANT Right ~ 08/2012   CHEST TUBE INSERTION Left 03/08/2019   Procedure: Chest Tube Insertion;  Surgeon: Ivin Poot, MD;  Location: [place];  Service: Thoracic;  Laterality: Left;   COLONOSCOPY  01/26/2012   Procedure: COLONOSCOPY;  Surgeon: Ladene Artist, MD,FACG;  Location: [facility] ENDOSCOPY;  Service: Endoscopy;  Laterality: N/A;  note the EGD is possible   CORONARY ANGIOPLASTY WITH STENT PLACEMENT  2006   X 1 stent   EPIDURAL BLOCK INJECTION     ESOPHAGOGASTRODUODENOSCOPY  01/26/2012   Procedure: ESOPHAGOGASTRODUODENOSCOPY (EGD);  Surgeon: Ladene Artist, MD,FACG;  Location: [facility] ENDOSCOPY;  Service: Endoscopy;  Laterality: N/A;   FINE NEEDLE ASPIRATION Right 09/26/2019   Procedure: FINE NEEDLE ASPIRATION;  Surgeon: Altamese [place], MD;  Location: [place];  Service: Orthopedics;  Laterality: Right;   HEMIARTHROPLASTY HIP Right 2012   IR EPIDUROGRAPHY  04/20/2019   LAPAROSCOPIC CHOLECYSTECTOMY  2001   LUMBAR FUSION  03/2013   LUMBAR LAMINECTOMY/DECOMPRESSION MICRODISCECTOMY Right 11/04/2015   Procedure: Right Lumbar Five-Sacral One Microdiskectomy;  Surgeon: Kristeen Miss, MD;  Location: [place] NEURO ORS;  Service: Neurosurgery;  Laterality: Right;  Right L5-S1 Microdiskectomy   moles removed that required stiches     one on leg and one on face   ORIF FEMUR FRACTURE Right 09/26/2019   Procedure: OPEN REDUCTION INTERNAL FIXATION (ORIF) DISTAL FEMUR FRACTURE;  Surgeon: Altamese [place], MD;  Location: [place];  Service: Orthopedics;  Laterality: Right;   ORIF FEMUR FRACTURE Right 10/17/2019   Procedure: OPEN REDUCTION INTERNAL FIXATION (ORIF) DISTAL FEMUR FRACTURE;  Surgeon: Altamese [place], MD;  Location: [place];  Service: Orthopedics;  Laterality: Right;   SUBXYPHOID PERICARDIAL WINDOW N/A 03/08/2019   Procedure: SUBXYPHOID PERICARDIAL WINDOW;  Surgeon: Ivin Poot, MD;  Location: [place];  Service: Thoracic;  Laterality: N/A;   TEE WITHOUT CARDIOVERSION N/A 03/08/2019   Procedure: TRANSESOPHAGEAL ECHOCARDIOGRAM (TEE);  Surgeon: Prescott Gum, Collier [place], MD;  Location: [place];  Service: Thoracic;  Laterality: N/A;   TEMPORAL ARTERY BIOPSY / LIGATION Bilateral 2011   TONSILLECTOMY AND ADENOIDECTOMY     at age 58   [place]  ~ [place]  ~ 2010   "lower back" (10/28/2012)    Current Outpatient Medications  Medication Sig Dispense Refill   acetaminophen (TYLENOL) 500 MG tablet Take 500 mg by mouth every 6 (six) hours as needed.     alendronate (FOSAMAX) 70 MG tablet Take 1 tablet (70 mg total) by mouth every 7 (seven) days. Take with a full glass of water on an empty stomach. (Patient taking differently: Take 70 mg by mouth every Thursday.) 4 tablet 11   allopurinol (ZYLOPRIM) 100 MG tablet Take 100 mg by mouth daily.     atorvastatin (LIPITOR) 80 MG tablet Take 1 tablet (  80 mg total) by mouth daily. (Patient taking differently: Take 80 mg by mouth at bedtime.) 30 tablet 3   cetirizine (ZYRTEC) 10 MG tablet Take 10 mg by mouth at bedtime.     cholecalciferol (VITAMIN D) 25 MCG tablet Take 2 tablets (2,000 Units total) by mouth 2 (two) times daily.     diltiazem (CARDIZEM CD) 240 MG 24 hr capsule Take 1 capsule (240 mg total) by mouth daily. 90 capsule 3   ELIQUIS 5 MG TABS tablet TAKE 1 TABLET BY MOUTH TWICE A DAY (Patient taking differently: Take 5 mg by mouth 2 (two) times daily.) 60 tablet 5   escitalopram (LEXAPRO) 10 MG tablet TAKE 1 TABLET BY MOUTH EVERY DAY (Patient taking differently: Take 10 mg by mouth daily.) 90 tablet 1   fentaNYL (DURAGESIC) 12 MCG/HR Place 1 patch onto the skin every 3 (three) days. 5 patch 0   fentaNYL (DURAGESIC) 25 MCG/HR Place 1 patch onto the skin every 3 (three) days. 5 patch 0   fentaNYL 37.5 MCG/HR PT72 Place 1 patch onto the skin every 3 (three) days.     ferrous sulfate 325 (65 FE) MG tablet Take 1  tablet (325 mg total) by mouth 2 (two) times daily with a meal. 60 tablet 3   furosemide (LASIX) 40 MG tablet Take 1 tablet (40 mg total) by mouth daily. Please keep upcoming Dr. appt for future refills. Thank you 30 tablet 0   HYDROcodone-acetaminophen (NORCO) 5-325 MG tablet Take 1 tablet by mouth every 12 (twelve) hours as needed for moderate pain. 15 tablet 0   HYDROcodone-acetaminophen (NORCO/VICODIN) 5-325 MG tablet Take 1 tablet by mouth See admin instructions. Tid x 2 days     ipratropium-albuterol (DUONEB) 0.5-2.5 (3) MG/3ML SOLN Take 3 mLs by nebulization See admin instructions. Bid x 7 days     losartan (COZAAR) 25 MG tablet Take 1 tablet (25 mg total) by mouth daily. (Patient taking differently: Take 12.5 mg by mouth daily.)     meloxicam (MOBIC) 7.5 MG tablet Take 7.5 mg by mouth daily.     metoprolol tartrate (LOPRESSOR) 100 MG tablet TAKE 1 TABLET BY MOUTH TWICE A DAY (Patient taking differently: Take 100 mg by mouth 2 (two) times daily.) 60 tablet 0   Multiple Vitamin (MULTIVITAMIN WITH MINERALS) TABS tablet Take 1 tablet by mouth daily. 60 tablet 0   ondansetron (ZOFRAN) 4 MG tablet Take 4 mg by mouth every 8 (eight) hours as needed for nausea or vomiting.     potassium chloride (KLOR-CON) 10 MEQ tablet Take 1 tablet (10 mEq total) by mouth daily. 90 tablet 2   predniSONE (DELTASONE) 10 MG tablet Take 1 tablet (10 mg total) by mouth daily with breakfast. 30 tablet 0   sulfaSALAzine (AZULFIDINE) 500 MG EC tablet Take 500 mg by mouth daily.     QUEtiapine (SEROQUEL) 25 MG tablet Take 1 tablet (25 mg total) by mouth at bedtime. 30 tablet 0   No current facility-administered medications for this visit.    Allergies  Allergen Reactions   Dextromethorphan Rash    Social History   Socioeconomic History   Marital status: Married    Spouse name: Not on file   Number of children: 3   Years of education: Not on file   Highest education level: Not on file  Occupational History    Occupation: Retired Cabin crew  Tobacco Use   Smoking status: Never   Smokeless tobacco: Never  Vaping Use   Vaping Use:  Never used  Substance and Sexual Activity   Alcohol use: Yes    Comment: socially   Drug use: No   Sexual activity: Not on file  Other Topics Concern   Not on file  Social History Narrative   Lives at [place] facility, [place]   Right Handed   Drinks 1-2 cups caffeine daily   Social Determinants of Health   Financial Resource Strain: Low Risk    Difficulty of Paying Living Expenses: Not hard at all  Food Insecurity: No Food Insecurity   Worried About Charity fundraiser in the Last Year: Never true   Ran Out of Food in the Last Year: Never true  Transportation Needs: No Transportation Needs   Lack of Transportation (Medical): No   Lack of Transportation (Non-Medical): No  Physical Activity: Inactive   Days of Exercise per Week: 0 days   Minutes of Exercise per Session: 0 min  Stress: Not on file  Social Connections: Unknown   Frequency of Communication with Friends and Family: More than three times a week   Frequency of Social Gatherings with Friends and Family: More than three times a week   Attends Religious Services: Never   Marine scientist or Organizations: No   Attends Music therapist: Never   Marital Status: Patient refused  Human resources officer Violence: Not At Risk   Fear of Current or Ex-Partner: No   Emotionally Abused: No   Physically Abused: No   Sexually Abused: No    Family History  Problem Relation Age of Onset   Brain cancer Mother 2   Heart attack Father    Breast cancer Daughter 87   Heart disease Other    Hypertension Other    Arthritis Neg Hx    Colon cancer Neg Hx    Osteoporosis Neg Hx     Review of Systems:  As stated in the HPI and otherwise negative.   BP 122/64    Pulse (!) 50    Ht 5' 4.5" (1.638 m)    Wt 169 lb (76.7 kg)    SpO2 95%    BMI 28.56 kg/m   Physical Examination:  General:  Well developed, well nourished, NAD  HEENT: OP clear, mucus membranes moist  SKIN: warm, dry. No rashes. Neuro: No focal deficits  Musculoskeletal: Muscle strength 5/5 all ext  Psychiatric: Mood and affect normal  Neck: No JVD, no carotid bruits, no thyromegaly, no lymphadenopathy.  Lungs:Clear bilaterally, no wheezes, rhonci, crackles Cardiovascular: Regular rate and rhythm. No murmurs, gallops or rubs. Abdomen:Soft. Bowel sounds present. Non-tender.  Extremities: No lower extremity edema. Pulses are 2 + in the bilateral DP/PT.  Cardiac cath 12/09/12: Ao: 150/61  LV: 155/13/18  RA: 6  RV: 25/5/8  PA: 24/10 (mean 16)  PCWP: 9  Fick Cardiac Output: 3.9 L/min  Fick Cardiac Index: 2.3 L/min/m2  Central Aortic Saturation: 98%  Pulmonary Artery Saturation: 69%  Angiographic Findings:  Left main: 10% distal stenosis.  Left Anterior Descending Artery: Large caliber vessel that courses to the apex. The proximal and mid vessel has moderate calcification. The mid vessel has a long 20% stenosis. There are two small caliber diagonal branches with mild plaque disease.  Circumflex Artery: Large caliber vessel with two small caliber obtuse marginal branches. No obstructive disease.  Right Coronary Artery: Large caliber, dominant vessel with a patent stent in the mid vessel. There is minimal stent restenosis. Just beyond the stent there is a 20% stenosis.  Left Ventricular Angiogram: LVEF=65%  Impression:  1. Stable single vessel CAD with patent mid RCA stent  2. Normal LV systolic function  3. Normal PA pressures  Echo January 2021:   1. Left ventricular ejection fraction, by visual estimation, is 60 to  65%. The left ventricle has normal function. There is mildly increased  left ventricular hypertrophy.   2. Left ventricular diastolic parameters are consistent with Grade II  diastolic dysfunction (pseudonormalization). The mean left atrial pressure  is high.   3. The left ventricle has no  regional wall motion abnormalities.   4. Global right ventricle has normal systolic function.The right  ventricular size is normal. No increase in right ventricular wall  thickness.   5. Left atrial size was mildly dilated.   6. Right atrial size was mildly dilated.   7. Mild mitral annular calcification.   8. The mitral valve is normal in structure. Trivial mitral valve  regurgitation.   9. The tricuspid valve is normal in structure.  10. The tricuspid valve is normal in structure. Tricuspid valve  regurgitation is mild-moderate.  11. The aortic valve is normal in structure. Aortic valve regurgitation is  trivial.  12. The pulmonic valve was not well visualized. Pulmonic valve  regurgitation is not visualized.  13. Mildly elevated pulmonary artery systolic pressure.  14. The tricuspid regurgitant velocity is 2.18 m/s, and with an assumed  right atrial pressure of 15 mmHg, the estimated right ventricular systolic  pressure is mildly elevated at 34.0 mmHg.  15. The inferior vena cava is dilated in size with <50% respiratory  variability, suggesting right atrial pressure of 15 mmHg.  16. There is exaggerated respiratory displacement of the interventricular  septum. This could represent contrictive physiology , but can also be seen  with increased work of breathing (e.g. COPD or asthma exacerbation).  63. Compared to 03/09/2019, left ventricular wall motion and overall  systolic function have improved.   EKG:  EKG is not ordered today. The ekg ordered today demonstrates  Recent Labs: 07/16/2020: B Natriuretic Peptide 96.2 03/14/2021: ALT 17 03/15/2021: TSH 4.912 03/16/2021: Magnesium 1.7 03/17/2021: Hemoglobin 8.3; Platelets 171 03/19/2021: BUN 10; Creatinine, Ser 0.93; Potassium 3.6; Sodium 136   Lipid Panel    Component Value Date/Time   CHOL 171 11/14/2018 1420   CHOL 155 08/18/2017 1241   TRIG 168.0 (H) 11/14/2018 1420   HDL 63.40 11/14/2018 1420   HDL 78 08/18/2017 1241    CHOLHDL 3 11/14/2018 1420   VLDL 33.6 11/14/2018 1420   LDLCALC 74 11/14/2018 1420   LDLCALC 51 08/18/2017 1241     Wt Readings from Last 3 Encounters:  05/21/21 169 lb (76.7 kg)  04/30/21 168 lb (76.2 kg)  03/16/21 168 lb 10.4 oz (76.5 kg)     Other studies Reviewed: Additional studies/ records that were reviewed today include: . Review of the above records demonstrates:    Assessment and Plan:   1. CAD with stable angina: She has a Taxus drug eluting stent in the RCA, placed in 2005 in [place]. Cath at [facility] September 2014 with patent RCA stent and mild disease LAD and circumflex. Stress test in June 2019 without ischemia. She has no chest pain concerning for angina. Will continue the beta blocker. No ASA since she is on Eliquis. She has stopped her statin due to leg pains.   2. HTN: BP is well controlled. No changes  3. Hyperlipidemia: Lipids followed in primary care. Continue statin.    4. PAF: She  is in sinus today. Will continue Cardizem, Lopressor and Eliquis.     5. Chronic diastolic CHF: Weight is stable. No volume overload on exam. Continue Lasix 40 mg daily. OK to take extra Lasix 40 mg as needed for LE edema.   6. Pre-operative cardiovascular examination:  She can proceed with her dental extraction. OK to hold Eliquis two days before her planned procedure.   Current medicines are reviewed at length with the patient today.  The patient does not have concerns regarding medicines.  The following changes have been made:  no change  Labs/ tests ordered today include:   No orders of the defined types were placed in this encounter.   Disposition:   F/U with me in 12 months  Signed, [name], MD 05/21/2021 1:31 PM    [facility] [address], [place], [place]  [address] Phone: [phone]; Fax: [phone]

## 2021-05-21 NOTE — Patient Instructions (Signed)
Medication Instructions:  °Your physician recommends that you continue on your current medications as directed. Please refer to the Current Medication list given to you today. ° °*If you need a refill on your cardiac medications before your next appointment, please call your pharmacy* ° ° °Lab Work: °NONE °If you have labs (blood work) drawn today and your tests are completely normal, you will receive your results only by: °MyChart Message (if you have MyChart) OR °A paper copy in the mail °If you have any lab test that is abnormal or we need to change your treatment, we will call you to review the results. ° ° °Testing/Procedures: °NONE ° ° °Follow-Up: °At [facility], you and your health needs are our priority.  As part of our continuing mission to provide you with exceptional heart care, we have created designated Provider Care Teams.  These Care Teams include your primary Cardiologist (physician) and Advanced Practice Providers (APPs -  Physician Assistants and Nurse Practitioners) who all work together to provide you with the care you need, when you need it. ° °Your next appointment:   °1 year(s) ° °The format for your next appointment:   °In Person ° °Provider:   °Christopher McAlhany, MD  °

## 2021-06-03 ENCOUNTER — Ambulatory Visit: Payer: Medicare Other | Admitting: Neurology

## 2021-08-05 ENCOUNTER — Encounter (HOSPITAL_COMMUNITY): Payer: Self-pay | Admitting: Emergency Medicine

## 2021-08-05 ENCOUNTER — Encounter (HOSPITAL_COMMUNITY)
Admission: EM | Disposition: A | Discharge status: Skilled Nursing Facility | Payer: Self-pay | Source: Skilled Nursing Facility | Attending: Internal Medicine

## 2021-08-05 ENCOUNTER — Observation Stay (HOSPITAL_COMMUNITY): Payer: Medicare Other | Admitting: Anesthesiology

## 2021-08-05 ENCOUNTER — Inpatient Hospital Stay (HOSPITAL_COMMUNITY)
Admission: EM | Admit: 2021-08-05 | Discharge: 2021-08-11 | DRG: 368 | Disposition: A | Discharge status: Skilled Nursing Facility | Payer: Medicare Other | Source: Skilled Nursing Facility | Attending: Internal Medicine | Admitting: Internal Medicine

## 2021-08-05 ENCOUNTER — Observation Stay (HOSPITAL_BASED_OUTPATIENT_CLINIC_OR_DEPARTMENT_OTHER): Payer: Medicare Other | Admitting: Anesthesiology

## 2021-08-05 ENCOUNTER — Emergency Department (HOSPITAL_COMMUNITY): Payer: Medicare Other

## 2021-08-05 DIAGNOSIS — J690 Pneumonitis due to inhalation of food and vomit: Secondary | ICD-10-CM | POA: Diagnosis not present

## 2021-08-05 DIAGNOSIS — Z9981 Dependence on supplemental oxygen: Secondary | ICD-10-CM

## 2021-08-05 DIAGNOSIS — I248 Other forms of acute ischemic heart disease: Secondary | ICD-10-CM | POA: Diagnosis not present

## 2021-08-05 DIAGNOSIS — J439 Emphysema, unspecified: Secondary | ICD-10-CM | POA: Diagnosis present

## 2021-08-05 DIAGNOSIS — Z888 Allergy status to other drugs, medicaments and biological substances status: Secondary | ICD-10-CM

## 2021-08-05 DIAGNOSIS — R001 Bradycardia, unspecified: Secondary | ICD-10-CM | POA: Diagnosis present

## 2021-08-05 DIAGNOSIS — D649 Anemia, unspecified: Secondary | ICD-10-CM | POA: Diagnosis not present

## 2021-08-05 DIAGNOSIS — K2101 Gastro-esophageal reflux disease with esophagitis, with bleeding: Secondary | ICD-10-CM | POA: Diagnosis not present

## 2021-08-05 DIAGNOSIS — Z96641 Presence of right artificial hip joint: Secondary | ICD-10-CM | POA: Diagnosis present

## 2021-08-05 DIAGNOSIS — M810 Age-related osteoporosis without current pathological fracture: Secondary | ICD-10-CM | POA: Diagnosis present

## 2021-08-05 DIAGNOSIS — D631 Anemia in chronic kidney disease: Secondary | ICD-10-CM | POA: Diagnosis present

## 2021-08-05 DIAGNOSIS — I5032 Chronic diastolic (congestive) heart failure: Secondary | ICD-10-CM | POA: Diagnosis present

## 2021-08-05 DIAGNOSIS — Z8249 Family history of ischemic heart disease and other diseases of the circulatory system: Secondary | ICD-10-CM

## 2021-08-05 DIAGNOSIS — N1832 Chronic kidney disease, stage 3b: Secondary | ICD-10-CM

## 2021-08-05 DIAGNOSIS — I2510 Atherosclerotic heart disease of native coronary artery without angina pectoris: Secondary | ICD-10-CM

## 2021-08-05 DIAGNOSIS — E876 Hypokalemia: Secondary | ICD-10-CM | POA: Diagnosis present

## 2021-08-05 DIAGNOSIS — M48061 Spinal stenosis, lumbar region without neurogenic claudication: Secondary | ICD-10-CM | POA: Diagnosis present

## 2021-08-05 DIAGNOSIS — M316 Other giant cell arteritis: Secondary | ICD-10-CM | POA: Diagnosis present

## 2021-08-05 DIAGNOSIS — E1122 Type 2 diabetes mellitus with diabetic chronic kidney disease: Secondary | ICD-10-CM | POA: Diagnosis present

## 2021-08-05 DIAGNOSIS — J9621 Acute and chronic respiratory failure with hypoxia: Secondary | ICD-10-CM | POA: Diagnosis present

## 2021-08-05 DIAGNOSIS — M069 Rheumatoid arthritis, unspecified: Secondary | ICD-10-CM | POA: Diagnosis present

## 2021-08-05 DIAGNOSIS — Z7901 Long term (current) use of anticoagulants: Secondary | ICD-10-CM

## 2021-08-05 DIAGNOSIS — Z955 Presence of coronary angioplasty implant and graft: Secondary | ICD-10-CM

## 2021-08-05 DIAGNOSIS — E861 Hypovolemia: Secondary | ICD-10-CM | POA: Diagnosis present

## 2021-08-05 DIAGNOSIS — E875 Hyperkalemia: Secondary | ICD-10-CM | POA: Diagnosis not present

## 2021-08-05 DIAGNOSIS — I471 Supraventricular tachycardia: Secondary | ICD-10-CM | POA: Diagnosis not present

## 2021-08-05 DIAGNOSIS — E785 Hyperlipidemia, unspecified: Secondary | ICD-10-CM | POA: Diagnosis present

## 2021-08-05 DIAGNOSIS — R1314 Dysphagia, pharyngoesophageal phase: Secondary | ICD-10-CM | POA: Diagnosis not present

## 2021-08-05 DIAGNOSIS — I959 Hypotension, unspecified: Secondary | ICD-10-CM | POA: Diagnosis not present

## 2021-08-05 DIAGNOSIS — Z981 Arthrodesis status: Secondary | ICD-10-CM

## 2021-08-05 DIAGNOSIS — I4891 Unspecified atrial fibrillation: Secondary | ICD-10-CM | POA: Diagnosis present

## 2021-08-05 DIAGNOSIS — K2971 Gastritis, unspecified, with bleeding: Secondary | ICD-10-CM | POA: Diagnosis not present

## 2021-08-05 DIAGNOSIS — E669 Obesity, unspecified: Secondary | ICD-10-CM | POA: Diagnosis present

## 2021-08-05 DIAGNOSIS — K2091 Esophagitis, unspecified with bleeding: Secondary | ICD-10-CM | POA: Diagnosis not present

## 2021-08-05 DIAGNOSIS — F32A Depression, unspecified: Secondary | ICD-10-CM | POA: Diagnosis present

## 2021-08-05 DIAGNOSIS — J9601 Acute respiratory failure with hypoxia: Secondary | ICD-10-CM | POA: Diagnosis not present

## 2021-08-05 DIAGNOSIS — N183 Chronic kidney disease, stage 3 unspecified: Secondary | ICD-10-CM | POA: Diagnosis present

## 2021-08-05 DIAGNOSIS — Z8679 Personal history of other diseases of the circulatory system: Secondary | ICD-10-CM

## 2021-08-05 DIAGNOSIS — R54 Age-related physical debility: Secondary | ICD-10-CM | POA: Diagnosis present

## 2021-08-05 DIAGNOSIS — Z79891 Long term (current) use of opiate analgesic: Secondary | ICD-10-CM

## 2021-08-05 DIAGNOSIS — R1310 Dysphagia, unspecified: Secondary | ICD-10-CM

## 2021-08-05 DIAGNOSIS — Z66 Do not resuscitate: Secondary | ICD-10-CM | POA: Diagnosis present

## 2021-08-05 DIAGNOSIS — G8929 Other chronic pain: Secondary | ICD-10-CM | POA: Diagnosis present

## 2021-08-05 DIAGNOSIS — Z7983 Long term (current) use of bisphosphonates: Secondary | ICD-10-CM

## 2021-08-05 DIAGNOSIS — R1319 Other dysphagia: Secondary | ICD-10-CM | POA: Diagnosis not present

## 2021-08-05 DIAGNOSIS — K2970 Gastritis, unspecified, without bleeding: Secondary | ICD-10-CM

## 2021-08-05 DIAGNOSIS — Z8673 Personal history of transient ischemic attack (TIA), and cerebral infarction without residual deficits: Secondary | ICD-10-CM

## 2021-08-05 DIAGNOSIS — I130 Hypertensive heart and chronic kidney disease with heart failure and stage 1 through stage 4 chronic kidney disease, or unspecified chronic kidney disease: Secondary | ICD-10-CM | POA: Diagnosis present

## 2021-08-05 DIAGNOSIS — Z79899 Other long term (current) drug therapy: Secondary | ICD-10-CM

## 2021-08-05 DIAGNOSIS — I480 Paroxysmal atrial fibrillation: Secondary | ICD-10-CM | POA: Diagnosis present

## 2021-08-05 DIAGNOSIS — Z993 Dependence on wheelchair: Secondary | ICD-10-CM

## 2021-08-05 DIAGNOSIS — M549 Dorsalgia, unspecified: Secondary | ICD-10-CM | POA: Diagnosis present

## 2021-08-05 DIAGNOSIS — Z791 Long term (current) use of non-steroidal anti-inflammatories (NSAID): Secondary | ICD-10-CM

## 2021-08-05 DIAGNOSIS — Z7952 Long term (current) use of systemic steroids: Secondary | ICD-10-CM

## 2021-08-05 DIAGNOSIS — Z6830 Body mass index (BMI) 30.0-30.9, adult: Secondary | ICD-10-CM

## 2021-08-05 DIAGNOSIS — I509 Heart failure, unspecified: Secondary | ICD-10-CM

## 2021-08-05 HISTORY — PX: FOREIGN BODY REMOVAL: SHX962

## 2021-08-05 HISTORY — PX: ESOPHAGOGASTRODUODENOSCOPY (EGD) WITH PROPOFOL: SHX5813

## 2021-08-05 LAB — COMPREHENSIVE METABOLIC PANEL
ALT: 21 U/L (ref 0–44)
AST: 27 U/L (ref 15–41)
Albumin: 3.2 g/dL — ABNORMAL LOW (ref 3.5–5.0)
Alkaline Phosphatase: 67 U/L (ref 38–126)
Anion gap: 5 (ref 5–15)
BUN: 22 mg/dL (ref 8–23)
CO2: 30 mmol/L (ref 22–32)
Calcium: 8.6 mg/dL — ABNORMAL LOW (ref 8.9–10.3)
Chloride: 106 mmol/L (ref 98–111)
Creatinine, Ser: 1.12 mg/dL — ABNORMAL HIGH (ref 0.44–1.00)
GFR, Estimated: 48 mL/min — ABNORMAL LOW (ref >60)
Glucose, Bld: 103 mg/dL — ABNORMAL HIGH (ref 70–99)
Potassium: 4.3 mmol/L (ref 3.5–5.1)
Sodium: 141 mmol/L (ref 135–145)
Total Bilirubin: 0.6 mg/dL (ref 0.3–1.2)
Total Protein: 5.9 g/dL — ABNORMAL LOW (ref 6.5–8.1)

## 2021-08-05 LAB — CBC
HCT: 37.9 % (ref 36.0–46.0)
Hemoglobin: 11.6 g/dL — ABNORMAL LOW (ref 12.0–15.0)
MCH: 30.1 pg (ref 26.0–34.0)
MCHC: 30.6 g/dL (ref 30.0–36.0)
MCV: 98.4 fL (ref 80.0–100.0)
Platelets: 214 10*3/uL (ref 150–400)
RBC: 3.85 MIL/uL — ABNORMAL LOW (ref 3.87–5.11)
RDW: 15 % (ref 11.5–15.5)
WBC: 14.9 10*3/uL — ABNORMAL HIGH (ref 4.0–10.5)
nRBC: 0 % (ref 0.0–0.2)

## 2021-08-05 LAB — LIPASE, BLOOD: Lipase: 33 U/L (ref 11–51)

## 2021-08-05 SURGERY — ESOPHAGOGASTRODUODENOSCOPY (EGD) WITH PROPOFOL
Anesthesia: General

## 2021-08-05 MED ORDER — SUGAMMADEX SODIUM 200 MG/2ML IV SOLN
INTRAVENOUS | Status: DC | PRN
Start: 2021-08-05 — End: 2021-08-05
  Administered 2021-08-05: 300 mg via INTRAVENOUS

## 2021-08-05 MED ORDER — ONDANSETRON HCL 4 MG/2ML IJ SOLN
4.0000 mg | Freq: Four times a day (QID) | INTRAMUSCULAR | Status: DC | PRN
  Administered 2021-08-06: 4 mg via INTRAVENOUS
  Filled 2021-08-05: qty 2

## 2021-08-05 MED ORDER — LIDOCAINE VISCOUS HCL 2 % MT SOLN
15.0000 mL | Freq: Once | OROMUCOSAL | Status: AC
  Administered 2021-08-05: 15 mL via ORAL
  Filled 2021-08-05: qty 15

## 2021-08-05 MED ORDER — SODIUM CHLORIDE 0.9 % IV SOLN: INTRAVENOUS | Status: DC

## 2021-08-05 MED ORDER — SUCCINYLCHOLINE CHLORIDE 200 MG/10ML IV SOSY
PREFILLED_SYRINGE | INTRAVENOUS | Status: DC | PRN
  Administered 2021-08-05: 100 mg via INTRAVENOUS

## 2021-08-05 MED ORDER — PANTOPRAZOLE SODIUM 40 MG IV SOLR
40.0000 mg | Freq: Two times a day (BID) | INTRAVENOUS | Status: DC
  Administered 2021-08-05 – 2021-08-09 (×9): 40 mg via INTRAVENOUS
  Filled 2021-08-05 (×10): qty 10

## 2021-08-05 MED ORDER — LOSARTAN POTASSIUM 25 MG PO TABS: 25.0000 mg | ORAL_TABLET | Freq: Every day | ORAL | Status: DC

## 2021-08-05 MED ORDER — LACTATED RINGERS IV SOLN: INTRAVENOUS | Status: DC | PRN

## 2021-08-05 MED ORDER — METHOCARBAMOL 1000 MG/10ML IJ SOLN
500.0000 mg | Freq: Four times a day (QID) | INTRAVENOUS | Status: DC | PRN
  Filled 2021-08-05: qty 5

## 2021-08-05 MED ORDER — HYDRALAZINE HCL 20 MG/ML IJ SOLN
10.0000 mg | Freq: Four times a day (QID) | INTRAMUSCULAR | Status: DC | PRN
  Administered 2021-08-09 – 2021-08-11 (×3): 10 mg via INTRAVENOUS
  Filled 2021-08-05 (×3): qty 1

## 2021-08-05 MED ORDER — ACETAMINOPHEN 325 MG PO TABS
650.0000 mg | ORAL_TABLET | ORAL | Status: DC | PRN
Start: 2021-08-05 — End: 2021-08-06

## 2021-08-05 MED ORDER — FENTANYL 12 MCG/HR TD PT72
1.0000 | MEDICATED_PATCH | TRANSDERMAL | Status: DC
  Administered 2021-08-07 – 2021-08-10 (×2): 1 via TRANSDERMAL
  Filled 2021-08-05 (×2): qty 1

## 2021-08-05 MED ORDER — SODIUM CHLORIDE 0.9 % IV SOLN
1.5000 g | Freq: Four times a day (QID) | INTRAVENOUS | Status: DC
  Administered 2021-08-05 – 2021-08-09 (×15): 1.5 g via INTRAVENOUS
  Filled 2021-08-05 (×2): qty 4
  Filled 2021-08-05: qty 1.5
  Filled 2021-08-05: qty 4
  Filled 2021-08-05: qty 1.5
  Filled 2021-08-05 (×12): qty 4

## 2021-08-05 MED ORDER — FENTANYL 25 MCG/HR TD PT72
1.0000 | MEDICATED_PATCH | TRANSDERMAL | Status: DC
  Administered 2021-08-07 – 2021-08-10 (×2): 1 via TRANSDERMAL
  Filled 2021-08-05 (×2): qty 1

## 2021-08-05 MED ORDER — ALUM & MAG HYDROXIDE-SIMETH 200-200-20 MG/5ML PO SUSP
30.0000 mL | Freq: Four times a day (QID) | ORAL | Status: DC
  Administered 2021-08-05 – 2021-08-07 (×5): 30 mL via ORAL
  Filled 2021-08-05 (×6): qty 30

## 2021-08-05 MED ORDER — FENTANYL CITRATE PF 50 MCG/ML IJ SOSY: 12.5000 ug | PREFILLED_SYRINGE | Freq: Three times a day (TID) | INTRAMUSCULAR | Status: DC | PRN

## 2021-08-05 MED ORDER — ONDANSETRON HCL 4 MG/2ML IJ SOLN
INTRAMUSCULAR | Status: DC | PRN
  Administered 2021-08-05: 4 mg via INTRAVENOUS

## 2021-08-05 MED ORDER — DILTIAZEM HCL 25 MG/5ML IV SOLN
5.0000 mg | Freq: Two times a day (BID) | INTRAVENOUS | Status: DC | PRN
  Administered 2021-08-06: 5 mg via INTRAVENOUS
  Filled 2021-08-05 (×3): qty 5

## 2021-08-05 MED ORDER — ROCURONIUM BROMIDE 10 MG/ML (PF) SYRINGE
PREFILLED_SYRINGE | INTRAVENOUS | Status: DC | PRN
  Administered 2021-08-05: 20 mg via INTRAVENOUS

## 2021-08-05 MED ORDER — LIDOCAINE 2% (20 MG/ML) 5 ML SYRINGE
INTRAMUSCULAR | Status: DC | PRN
Start: 2021-08-05 — End: 2021-08-05
  Administered 2021-08-05: 60 mg via INTRAVENOUS

## 2021-08-05 MED ORDER — ATORVASTATIN CALCIUM 80 MG PO TABS
80.0000 mg | ORAL_TABLET | Freq: Every day | ORAL | Status: DC
  Administered 2021-08-07 – 2021-08-11 (×5): 80 mg via ORAL
  Filled 2021-08-05 (×5): qty 1

## 2021-08-05 MED ORDER — PROPOFOL 10 MG/ML IV BOLUS
INTRAVENOUS | Status: DC | PRN
Start: 2021-08-05 — End: 2021-08-05
  Administered 2021-08-05: 100 mg via INTRAVENOUS

## 2021-08-05 MED ORDER — METOPROLOL TARTRATE 5 MG/5ML IV SOLN
2.5000 mg | Freq: Four times a day (QID) | INTRAVENOUS | Status: DC
  Administered 2021-08-05 – 2021-08-06 (×3): 2.5 mg via INTRAVENOUS
  Filled 2021-08-05 (×3): qty 5

## 2021-08-05 MED ORDER — IPRATROPIUM-ALBUTEROL 0.5-2.5 (3) MG/3ML IN SOLN
3.0000 mL | Freq: Two times a day (BID) | RESPIRATORY_TRACT | Status: DC
  Administered 2021-08-05 – 2021-08-08 (×6): 3 mL via RESPIRATORY_TRACT
  Filled 2021-08-05 (×8): qty 3

## 2021-08-05 SURGICAL SUPPLY — 15 items

## 2021-08-05 NOTE — Progress Notes (Signed)
HOSPITAL MEDICINE OVERNIGHT EVENT NOTE   ? ?Notified by nursing that patient has exhibited a fever as high as 102.2 ?F.  As needed Tylenol has been ordered. ? ?Chart reviewed, patient is being treated for suspected bibasilar aspiration pneumonia with intravenous Unasyn.  No changes to antibiotic regimen at this time. ? ?Vernelle Emerald  MD ?[facility]  ? ? ? ? ? ? ? ? ? ? ?

## 2021-08-05 NOTE — H&P (View-Only) (Signed)
? ?                                                                          [facility] Consult: ?11:40 AM ?08/05/2021 ? LOS: 0 days  ? ? ?Referring Provider: Dr Kathrynn Humble.    ?Primary Care Physician:  Alvester Morin, MD in [place].   ?Primary Gastroenterologist:  Dr. Fuller Plan.    ? ? ? ?Reason for Consultation: Dysphagia. ?  ?HPI: Betty Jackson is a 87 y.o. female.  SNF resident.  PMH see extensive listing below but includes COPD, emphysema.  PAF.  Chronic Eliquis.  Osteoporosis, on weekly Fosamax.  CAD, stenting 2016.  CVA.  Chronic diastolic heart failure.  Giant cell arteritis, on daily prednisone..  Lumbar, sacral spine fractures.  S/p lumbar fusion.  S/p right total hip replacement.  Labeled as vocal cord dysfunction in 2014.  C. difficile colitis diagnosed March 2021 treated with vancomycin pulsed taper with resolution of diarrhea.  [place] anemia.  GERD. ?Last hospitalization was in mid December 2022 with encephalopathy attributed to polypharmacy, AKI, labeled as failure to thrive.  She is on multiple chronic pain meds.  Wheelchair-bound at baseline.   ? ?12/2011 EGD.  To D2.  For evaluation abdominal pain.  Small hiatal hernia, otherwise normal study. ?12/2011 colonoscopy.  Sigmoid and descending diverticulosis.  Nonbleeding moderate-sized, internal/external hemorrhoids.  Normal TI. ?04/2012 CTAP with angiography for back and abdominal pain.  Patent visceral vessels, no findings to support diagnosis of mesenteric ischemia.  Slight variant renal anatomy with right sided dual renal arteries.  Uncomplicated colonic diverticulosis.  Lumbar spine scoliosis. ?05/28/2019 CTAP with contrast showed some perisigmoid stranding, likely chronic.  Sigmoid diverticulosis.  Mild acute diverticulitis not excluded. ?09/2019 modified barium swallow study.  OP swallowing mechanism normal.  Transient penetration with thin liquids  considered within normal limits.  Recommended regular texture diet and thin liquids along with swallowing precautions. ? ?Underwent multiple tooth extractions around March 17, she thinks they removed 10 or 11 teeth.  Eliquis was held for this and when she restarted it, she started bleeding from her gums so it was held again but she has since resumed Eliquis.  Last dose was yesterday ?Sent from SNF today with complaint of productive cough and difficulty swallowing.  Acute onset solid/liquid dysphagia started early yesterday morning.  Feels food getting stuck at level of upper cervical esophagus, back of her throat.  When she tries to swallow contents of food regurgitate back up.  No recall of acute dysphagia while eating the previous day.  She is coughing up blood-tinged material as well and clearing both esophageal contents and pulmonary based sputum.  No prior history of food impaction.  Normally takes her weekly Fosamax on Thursdays and denies difficulty swallowing this pill.  No outpt PPI or H2B.  Takes Mobic daily ?Oxygen sats on 2 L nasal cannula oxygen decreased to 85% so flow rate increased to 3 L with improvement to 97% sats. ? ?Single view CXR shows mild, bibasilar opacities likely representing atelectasis. ?WBCs 14.9.  Hb 11.6.  MCV 98.  Platelets 214. ?GFR 48, slightly improved from 45 in mid December.  With the exception of low albumin at 3.2, LFTs normal. ?  ? ?Past  Medical History:  ?Diagnosis Date  ? Acute respiratory failure with hypoxia ([place]) 05/20/2017  ? Anemia, unspecified 10/28/2012  ? Anxiety state, unspecified 10/28/2012  ? CAD (coronary artery disease)   ? a. Stent RCA in [facility];  b. Cath approx 2009 - nonobs per pt report.  ? Cataract   ? immature on the left eye  ? Chronic insomnia 02/07/2013  ? Chronic lower back pain   ? scoliosis  ? CKD (chronic kidney disease) stage 3, GFR 30-59 ml/min (HCC)   ? CVA (cerebral infarction) 10/29/2012  ? DDD (degenerative disc disease)   ?  Diverticulosis   ? GERD (gastroesophageal reflux disease) 09/01/2010  ? Hemorrhoids   ? Herniated nucleus pulposus, L5-S1, right 11/04/2015  ? Hyperlipidemia   ? takes Lipitor daily  ? Hypertension   ? takes Amlodipine,Losartan,Metoprolol,and HCTZ daily  ? Incontinence of urine   ? Insomnia   ? takes Restoril nightly  ? Lumbar stenosis 04/25/2013  ? Major depressive disorder, recurrent episode, moderate ([place]) 07/16/2013  ? Osteoporosis   ? PAF (paroxysmal atrial fibrillation) ([place]) 2011  ? a. lone epidode in 2011 according to notes.  ? Rheumatoid arthritis ([place])   ? Scoliosis   ? Sepsis ([place]) 05/2019  ? Stroke [facility])   ? Temporal arteritis ([place]) 2011  ? a. followed @ [facility]; potential flareup 10/28/2012/notes 10/28/2012  ? Vocal cord dysfunction   ? "they don't operate properly" (10/28/2012)  ? ? ?Past Surgical History:  ?Procedure Laterality Date  ? ABDOMINAL HYSTERECTOMY  ~ 1984  ? vaginally  ? BACK SURGERY  7-20yr ago  ? X Stop  ? BLADDER SUSPENSION  2001  ? BREAST BIOPSY Right   ? CATARACT EXTRACTION W/ INTRAOCULAR LENS IMPLANT Right ~ 08/2012  ? CHEST TUBE INSERTION Left 03/08/2019  ? Procedure: Chest Tube Insertion;  Surgeon: VIvin Poot MD;  [facility]  Service: Thoracic;  Laterality: Left;  ? COLONOSCOPY  01/26/2012  ? Procedure: COLONOSCOPY;  Surgeon: MLadene Artist MD,FACG;  [facility];  Service: Endoscopy;  Laterality: N/A;  note the EGD is possible  ? CORONARY ANGIOPLASTY WITH STENT PLACEMENT  2006  ? X 1 stent  ? EPIDURAL BLOCK INJECTION    ? ESOPHAGOGASTRODUODENOSCOPY  01/26/2012  ? Procedure: ESOPHAGOGASTRODUODENOSCOPY (EGD);  Surgeon: MLadene Artist MD,FACG;  [facility];  Service: Endoscopy;  Laterality: N/A;  ? FINE NEEDLE ASPIRATION Right 09/26/2019  ? Procedure: FINE NEEDLE ASPIRATION;  Surgeon: HAltamese Creston MD;  [facility]  Service: Orthopedics;  Laterality: Right;  ? HEMIARTHROPLASTY HIP Right 2012  ? IR EPIDUROGRAPHY  04/20/2019  ? LAPAROSCOPIC  CHOLECYSTECTOMY  2001  ? LUMBAR FUSION  03/2013  ? LUMBAR LAMINECTOMY/DECOMPRESSION MICRODISCECTOMY Right 11/04/2015  ? Procedure: Right Lumbar Five-Sacral One Microdiskectomy;  Surgeon: HKristeen Miss MD;  [facility];  Service: Neurosurgery;  Laterality: Right;  Right L5-S1 Microdiskectomy  ? moles removed that required stiches    ? one on leg and one on face  ? ORIF FEMUR FRACTURE Right 09/26/2019  ? Procedure: OPEN REDUCTION INTERNAL FIXATION (ORIF) DISTAL FEMUR FRACTURE;  Surgeon: HAltamese [place] MD;  [facility]  Service: Orthopedics;  Laterality: Right;  ? ORIF FEMUR FRACTURE Right 10/17/2019  ? Procedure: OPEN REDUCTION INTERNAL FIXATION (ORIF) DISTAL FEMUR FRACTURE;  Surgeon: HAltamese Dragoon MD;  [facility]  Service: Orthopedics;  Laterality: Right;  ? SUBXYPHOID PERICARDIAL WINDOW N/A 03/08/2019  ? Procedure: SUBXYPHOID PERICARDIAL WINDOW;  Surgeon: VIvin Poot MD;  [facility];  Service: Thoracic;  Laterality: N/A;  ? TEE WITHOUT CARDIOVERSION N/A 03/08/2019  ? Procedure: TRANSESOPHAGEAL ECHOCARDIOGRAM (TEE);  Surgeon: Prescott Gum, Collier Salina, MD;  Location: [facility];  Service: Thoracic;  Laterality: N/A;  ? TEMPORAL ARTERY BIOPSY / LIGATION Bilateral 2011  ? TONSILLECTOMY AND ADENOIDECTOMY    ? at age 15  ? [place]  ~ 75  ? X-STOP IMPLANTATION  ~ 2010  ? "lower back" (10/28/2012)  ? ? ?Prior to Admission medications   ?Medication Sig Start Date End Date Taking? Authorizing Provider  ?acetaminophen (TYLENOL) 500 MG tablet Take 500 mg by mouth every 6 (six) hours as needed.    [provider]  ?alendronate (FOSAMAX) 70 MG tablet Take 1 tablet (70 mg total) by mouth every 7 (seven) days. Take with a full glass of water on an empty stomach. ?Patient taking differently: Take 70 mg by mouth every Thursday. 06/14/20   Burchette, Alinda Sierras, MD  ?allopurinol (ZYLOPRIM) 100 MG tablet Take 100 mg by mouth daily.    [provider]  ?atorvastatin (LIPITOR) 80 MG tablet Take 1  tablet (80 mg total) by mouth daily. ?Patient taking differently: Take 80 mg by mouth at bedtime. 09/25/20   Garvin Fila, MD  ?cetirizine (ZYRTEC) 10 MG tablet Take 10 mg by mouth at bedtime.    [provider]  ?

## 2021-08-05 NOTE — Interval H&P Note (Signed)
History and Physical Interval Note: ? ?08/05/2021 ?3:28 PM ? ?Betty Jackson  has presented today for surgery, with the diagnosis of Suspected food impaction.  Acute onset liquid, solid dysphagia yesterday morning.  Now with regurgitation..  The various methods of treatment have been discussed with the patient and family. After consideration of risks, benefits and other options for treatment, the patient has consented to  Procedure(s): ?ESOPHAGOGASTRODUODENOSCOPY (EGD) WITH PROPOFOL (N/A) as a surgical intervention.  The patient's history has been reviewed, patient examined, no change in status, stable for surgery.  I have reviewed the patient's chart and labs.  Questions were answered to the patient's satisfaction.   ? ? ?[name] ? ? ?

## 2021-08-05 NOTE — Op Note (Signed)
[facility] ?Patient Name: Betty Jackson ?Procedure Date : 08/05/2021 ?MRN: [id number] ?Attending MD: Estill Cotta. Loletha Carrow , MD ?Date of Birth: 1934-05-20 ?CSN: [id number] ?Age: 87 ?Admit Type: Outpatient ?Procedure:                Upper GI endoscopy ?Indications:              Esophageal dysphagia, Odynophagia ?Providers:                Estill Cotta. Loletha Carrow, MD, Katina Degree, RN, Elberta Fortis  ?                          Johnella Moloney, Technician ?Referring MD:             [facility] ED physician ?Medicines:                General Anesthesia ?Complications:            No immediate complications. ?Estimated Blood Loss:     Estimated blood loss was minimal. ?Procedure:                Pre-Anesthesia Assessment: ?                          - Prior to the procedure, a History and Physical  ?                          was performed, and patient medications and  ?                          allergies were reviewed. The patient's tolerance of  ?                          previous anesthesia was also reviewed. The risks  ?                          and benefits of the procedure and the sedation  ?                          options and risks were discussed with the patient.  ?                          All questions were answered, and informed consent  ?                          was obtained. Prior Anticoagulants: The patient has  ?                          taken Eliquis (apixaban), last dose was 1 day prior  ?                          to procedure. ASA Grade Assessment: IV - A patient  ?                          with severe systemic disease that is a constant  ?  threat to life. After reviewing the risks and  ?                          benefits, the patient was deemed in satisfactory  ?                          condition to undergo the procedure. ?                          After obtaining informed consent, the endoscope was  ?                          passed under direct vision. Throughout the  ?                           procedure, the patient's blood pressure, pulse, and  ?                          oxygen saturations were monitored continuously. The  ?                          [id number]-[id number] ([id number]) Olympus endoscope was introduced  ?                          through the mouth, and advanced to the second part  ?                          of duodenum. The upper GI endoscopy was  ?                          accomplished without difficulty. The patient  ?                          tolerated the procedure fairly well. ?Scope In: ?Scope Out: ?Findings: ?     Very severe esophagitis with contact bleeding and adherent clotted blood  ?     was found 20 to 40 cm from the incisors. It was difficult to assess the  ?     underlying mucosa, which appeared heaped in multiple locations,  ?     especially the EGJ. No biopsies were taken due to friability and recent  ?     Eliquis dosing. ?     Diffuse moderate inflammation characterized by adherent blood and  ?     erythema was found in the entire examined stomach. ?     The cardia and gastric fundus were normal on retroflexion. ?     The examined duodenum was normal. ?     Food was found in the middle third of the esophagus, though it was not  ?     completely impacted. Removal of food was accomplished by flushing water  ?     and pushing it in to the stomach. ?Impression:               - Severe esophagitis with bleeding. ?                          -  Gastritis. ?                          - Normal examined duodenum. ?                          - No specimens collected. ?                          This is more severe reflux than is expected from  ?                          GERD, and medication-induced esophagitis is  ?                          suspected (alendronate). ?Recommendation:           - Admit the patient to hospital ward for ongoing  ?                          care. ?                          - Full liquid diet. ?                          - Do not resume anticoagulation at this time. ?                           - GI cocktail QID ?                          - IV fluids ?                          - Only essental oral meds can be given. Large  ?                          tablets should be crushed in yogurt/applesauce or  ?                          given in alternate form. ?                          - Pantoprazole 40 mg IV Q 12 hrs ?                          - Head of bed elevated 45 degrees at all times. ?                          - Discontinue alendronate. ?                          - Repeat EGD in 2 days to assess condition and  ?                          possibly take biopsies to rule out malignancy. ?                          -  The findings and recommendations were discussed  ?                          with the patient's family. (both daughters by phone) ?Procedure Code(s):        --- Professional --- ?                          [id number], Esophagogastroduodenoscopy, flexible,  ?                          transoral; with removal of foreign body(s) ?Diagnosis Code(s):        --- Professional --- ?                          K20.91, Esophagitis, unspecified with bleeding ?                          K29.70, Gastritis, unspecified, without bleeding ?                          R13.14, Dysphagia, pharyngoesophageal phase ?CPT copyright 2019 American Medical Association. All rights reserved. ?The codes documented in this report are preliminary and upon coder review may  ?be revised to meet current compliance requirements. ?Lawerence Dery L. Loletha Carrow, MD ?08/05/2021 4:07:25 PM ?This report has been signed electronically. ?Number of Addenda: 0 ?

## 2021-08-05 NOTE — ED Triage Notes (Signed)
Patient BIB [facility] from [facility] for complaint of difficulty swallow and a productive cough over the last two days. Facility also reports patient SpO2 on 2L O2 [place] had decreased to 85%, flow rate increased to 3L and SpO2 increased to 97%. Patient states she had a similar episode last year and needed to have her "esophagus opened" which resolved difficulty swallowing. Patient is alert and in no apparent distress at this time ? ?EMS Vitals ?162/68 ?97% on 3L O2 [place] ?HR 78 ? ?

## 2021-08-05 NOTE — ED Notes (Signed)
Patient transported to endo.  

## 2021-08-05 NOTE — ED Provider Notes (Signed)
?[facility] ?Provider Note ? ? ?CSN: [id number] ?Arrival date & time: 08/05/21  1022 ? ?  ? ?History ? ?Chief Complaint  ?Patient presents with  ? Dysphagia  ? ? ?Betty Jackson is a 87 y.o. female. ? ?HPI ? ?  ? ?87 year old female with history of paroxysmal A-fib, hypertension, giant cell arteritis, CHF comes in with chief complaint of difficulty in swallowing. ? ?Patient is accompanied here by her daughter at the bedside, and we were also able to speak with another daughter over the phone, who both provided meaningful history. ? ?Patient states that she had a meal on Sunday at [facility] which was fine, but on Monday she started noticing some difficulty in swallowing.  Her symptoms persisted throughout the day with solid and water.  She hardly had anything to eat because of her symptoms.  The nursing home clinician came to assess her today, given that she was not improving they advised that she come to the ER. ? ?Patient denies any associated one-sided numbness, tingling, weakness, slurred speech, vision change. ? ?Review of system is positive for cough, that has been present even before the difficulty in swallowing started. ? ?Finally, patient is usually on 2 L of oxygen, but now requiring 3 L of oxygen. ? ?Home Medications ?Prior to Admission medications   ?Medication Sig Start Date End Date Taking? Authorizing Provider  ?acetaminophen (TYLENOL) 500 MG tablet Take 500 mg by mouth every 6 (six) hours as needed.    [provider]  ?alendronate (FOSAMAX) 70 MG tablet Take 1 tablet (70 mg total) by mouth every 7 (seven) days. Take with a full glass of water on an empty stomach. ?Patient taking differently: Take 70 mg by mouth every Thursday. 06/14/20   Burchette, Alinda Sierras, MD  ?allopurinol (ZYLOPRIM) 100 MG tablet Take 100 mg by mouth daily.    [provider]  ?atorvastatin (LIPITOR) 80 MG tablet Take 1 tablet (80 mg total) by mouth daily. ?Patient taking  differently: Take 80 mg by mouth at bedtime. 09/25/20   Garvin Fila, MD  ?cetirizine (ZYRTEC) 10 MG tablet Take 10 mg by mouth at bedtime.    [provider]  ?cholecalciferol (VITAMIN D) 25 MCG tablet Take 2 tablets (2,000 Units total) by mouth 2 (two) times daily. 09/29/19   Ainsley Spinner, PA-C  ?diltiazem (CARDIZEM CD) 240 MG 24 hr capsule Take 1 capsule (240 mg total) by mouth daily. 03/28/20   Burnell Blanks, MD  ?ELIQUIS 5 MG TABS tablet TAKE 1 TABLET BY MOUTH TWICE A DAY ?Patient taking differently: Take 5 mg by mouth 2 (two) times daily. 05/09/20   Burnell Blanks, MD  ?escitalopram (LEXAPRO) 10 MG tablet TAKE 1 TABLET BY MOUTH EVERY DAY ?Patient taking differently: Take 10 mg by mouth daily. 08/27/20   Burchette, Alinda Sierras, MD  ?fentaNYL (DURAGESIC) 12 MCG/HR Place 1 patch onto the skin every 3 (three) days. 03/21/21   Oswald Hillock, MD  ?fentaNYL (DURAGESIC) 25 MCG/HR Place 1 patch onto the skin every 3 (three) days. 03/21/21   Oswald Hillock, MD  ?fentaNYL 37.5 MCG/HR PT72 Place 1 patch onto the skin every 3 (three) days. 04/16/21   [provider]  ?ferrous sulfate 325 (65 FE) MG tablet Take 1 tablet (325 mg total) by mouth 2 (two) times daily with a meal. 04/21/19   Oswald Hillock, MD  ?furosemide (LASIX) 40 MG tablet Take 1 tablet (40 mg total) by mouth  daily. Please keep upcoming Dr. appt for future refills. Thank you 11/01/20   Leanor Kail, PA  ?HYDROcodone-acetaminophen (NORCO) 5-325 MG tablet Take 1 tablet by mouth every 12 (twelve) hours as needed for moderate pain. 03/19/21   Oswald Hillock, MD  ?HYDROcodone-acetaminophen (NORCO/VICODIN) 5-325 MG tablet Take 1 tablet by mouth See admin instructions. Tid x 2 days    [provider]  ?ipratropium-albuterol (DUONEB) 0.5-2.5 (3) MG/3ML SOLN Take 3 mLs by nebulization See admin instructions. Bid x 7 days    [provider]  ?losartan (COZAAR) 25 MG tablet Take 1 tablet (25 mg total) by mouth  daily. ?Patient taking differently: Take 12.5 mg by mouth daily. 03/19/21 03/19/22  Oswald Hillock, MD  ?meloxicam (MOBIC) 7.5 MG tablet Take 7.5 mg by mouth daily.    [provider]  ?metoprolol tartrate (LOPRESSOR) 100 MG tablet TAKE 1 TABLET BY MOUTH TWICE A DAY ?Patient taking differently: Take 100 mg by mouth 2 (two) times daily. 08/28/20   Burnell Blanks, MD  ?Multiple Vitamin (MULTIVITAMIN WITH MINERALS) TABS tablet Take 1 tablet by mouth daily. 10/21/19   Ainsley Spinner, PA-C  ?ondansetron (ZOFRAN) 4 MG tablet Take 4 mg by mouth every 8 (eight) hours as needed for nausea or vomiting.    [provider]  ?potassium chloride (KLOR-CON) 10 MEQ tablet Take 1 tablet (10 mEq total) by mouth daily. 06/07/20   Burchette, Alinda Sierras, MD  ?predniSONE (DELTASONE) 10 MG tablet Take 1 tablet (10 mg total) by mouth daily with breakfast. 10/30/19   Charlynne Cousins, MD  ?QUEtiapine (SEROQUEL) 25 MG tablet Take 1 tablet (25 mg total) by mouth at bedtime. 03/16/21 05/01/21  Darliss Cheney, MD  ?sulfaSALAzine (AZULFIDINE) 500 MG EC tablet Take 500 mg by mouth daily.    [provider]  ?   ? ?Allergies    ?Dextromethorphan   ? ?Review of Systems   ?Review of Systems  ?All other systems reviewed and are negative. ? ?Physical Exam ?Updated Vital Signs ?BP (!) 120/57   Pulse 66   Temp 100 ?F (37.8 ?C) (Oral)   Resp 14   SpO2 95%  ?Physical Exam ?Vitals and nursing note reviewed.  ?Constitutional:   ?   Appearance: She is well-developed.  ?HENT:  ?   Head: Atraumatic.  ?Eyes:  ?   Extraocular Movements: Extraocular movements intact.  ?   Pupils: Pupils are equal, round, and reactive to light.  ?Cardiovascular:  ?   Rate and Rhythm: Normal rate.  ?Pulmonary:  ?   Effort: Pulmonary effort is normal.  ?Abdominal:  ?   Tenderness: There is no abdominal tenderness.  ?Musculoskeletal:  ?   Cervical back: Normal range of motion and neck supple.  ?Skin: ?   General: Skin is warm and dry.  ?Neurological:  ?    Mental Status: She is alert and oriented to person, place, and time.  ? ? ?ED Results / Procedures / Treatments   ?Labs ?(all labs ordered are listed, but only abnormal results are displayed) ?Labs Reviewed  ?COMPREHENSIVE METABOLIC PANEL - Abnormal; Notable for the following components:  ?    Result Value  ? Glucose, Bld 103 (*)   ? Creatinine, Ser 1.12 (*)   ? Calcium 8.6 (*)   ? Total Protein 5.9 (*)   ? Albumin 3.2 (*)   ? GFR, Estimated 48 (*)   ? All other components within normal limits  ?CBC - Abnormal; Notable for the following components:  ?  WBC 14.9 (*)   ? RBC 3.85 (*)   ? Hemoglobin 11.6 (*)   ? All other components within normal limits  ?LIPASE, BLOOD  ? ? ?EKG ?EKG Interpretation ? ?Date/Time:  Tuesday Aug 05 2021 10:26:33 EDT ?Ventricular Rate:  72 ?PR Interval:  192 ?QRS Duration: 98 ?QT Interval:  376 ?QTC Calculation: 412 ?R Axis:   -6 ?Text Interpretation: Sinus rhythm Low voltage, precordial leads Abnormal R-wave progression, early transition Borderline T abnormalities, anterior leads No acute changes Confirmed by Varney Biles [id number]) on 08/05/2021 2:12:46 PM ? ?Radiology ?DG Chest Port 1 View ? ?Result Date: 08/05/2021 ?CLINICAL DATA:  Dysphagia EXAM: PORTABLE CHEST 1 VIEW COMPARISON:  Chest x-ray dated March 14, 2021 FINDINGS: Cardiac and mediastinal contours are within normal limits. Mild bibasilar opacities. No large pleural effusion or pneumothorax. IMPRESSION: Mild bibasilar opacities, likely atelectasis. Electronically Signed   By: Yetta Glassman M.D.   On: 08/05/2021 11:32   ? ?Procedures ?Procedures  ? ? ?Medications Ordered in ED ?Medications  ?0.9 %  sodium chloride infusion ( Intravenous New Bag/Given 08/05/21 1324)  ? ? ?ED Course/ Medical Decision Making/ A&P ?  ?                        ?Medical Decision Making ?Amount and/or Complexity of Data Reviewed ?Labs: ordered. ?Radiology: ordered. ? ? ?This patient presents to the ED with chief complaint(s) of difficulty in swallowing  with pertinent past medical history of TIA, A-fib, rheumatologic disorder which further complicates the presenting complaint. The complaint involves an extensive differential diagnosis and also carries with it a high risk of

## 2021-08-05 NOTE — Transfer of Care (Signed)
Immediate Anesthesia Transfer of Care Note ? ?Patient: Betty Jackson ? ?Procedure(s) Performed: ESOPHAGOGASTRODUODENOSCOPY (EGD) WITH PROPOFOL ? ?Patient Location: PACU ? ?Anesthesia Type:General ? ?Level of Consciousness: awake, alert  and oriented ? ?Airway & Oxygen Therapy: Patient Spontanous Breathing and Patient connected to face mask oxygen ? ?Post-op Assessment: Report given to RN and Post -op Vital signs reviewed and stable ? ?Post vital signs: Reviewed and stable ? ?Last Vitals:  ?Vitals Value Taken Time  ?BP 162/43 08/05/21 1602  ?Temp    ?Pulse 87 08/05/21 1609  ?Resp 17 08/05/21 1609  ?SpO2 98 % 08/05/21 1609  ?Vitals shown include unvalidated device data. ? ?Last Pain:  ?Vitals:  ? 08/05/21 1410  ?TempSrc: Temporal  ?PainSc: 0-No pain  ?   ? ?  ? ?Complications: No notable events documented. ?

## 2021-08-05 NOTE — Anesthesia Preprocedure Evaluation (Signed)
Anesthesia Evaluation  ?Patient identified by MRN, date of birth, ID band ?Patient awake ? ? ? ?Reviewed: ?Allergy & Precautions, Patient's Chart, lab work & pertinent test results ? ?Airway ?Mallampati: II ? ?TM Distance: >3 FB ?Neck ROM: Full ? ? ? Dental ?no notable dental hx. ? ?  ?Pulmonary ? ?  ?Pulmonary exam normal ? ? ? ? ? ? ? Cardiovascular ?hypertension, Pt. on medications and Pt. on home beta blockers ?+ CAD, + Cardiac Stents and +CHF  ?+ dysrhythmias Atrial Fibrillation  ?Rhythm:Regular Rate:Normal ? ? ?  ?Neuro/Psych ?Anxiety Depression CVA   ? GI/Hepatic ?Neg liver ROS, GERD  ,Food impaction ?  ?Endo/Other  ?negative endocrine ROS ? Renal/GU ?CRFRenal disease  ?negative genitourinary ?  ?Musculoskeletal ? ?(+) Arthritis , Osteoarthritis,   ? Abdominal ?Normal abdominal exam  (+)   ?Peds ? Hematology ? ?(+) Blood dyscrasia, anemia ,   ?Anesthesia Other Findings ? ? Reproductive/Obstetrics ? ?  ? ? ? ? ? ? ? ? ? ? ? ? ? ?  ?  ? ? ? ? ? ? ? ? ?Anesthesia Physical ?Anesthesia Plan ? ?ASA: 3 and emergent ? ?Anesthesia Plan: General  ? ?Post-op Pain Management:   ? ?Induction: Intravenous and Rapid sequence ? ?PONV Risk Score and Plan: 3 and Ondansetron, Midazolam, Dexamethasone and Treatment may vary due to age or medical condition ? ?Airway Management Planned: Mask and Oral ETT ? ?Additional Equipment: None ? ?Intra-op Plan:  ? ?Post-operative Plan: Extubation in OR ? ?Informed Consent: I have reviewed the patients History and Physical, chart, labs and discussed the procedure including the risks, benefits and alternatives for the proposed anesthesia with the patient or authorized representative who has indicated his/her understanding and acceptance.  ? ? ? ?Dental advisory given ? ?Plan Discussed with: CRNA ? ?Anesthesia Plan Comments: (Lab Results ?     Component                Value               Date                 ?     WBC                      14.9 (H)             08/05/2021           ?     HGB                      11.6 (L)            08/05/2021           ?     HCT                      37.9                08/05/2021           ?     MCV                      98.4                08/05/2021           ?     PLT  214                 08/05/2021           ?Lab Results ?     Component                Value               Date                 ?     NA                       141                 08/05/2021           ?     K                        4.3                 08/05/2021           ?     CO2                      30                  08/05/2021           ?     GLUCOSE                  103 (H)             08/05/2021           ?     BUN                      22                  08/05/2021           ?     CREATININE               1.12 (H)            08/05/2021           ?     CALCIUM                  8.6 (L)             08/05/2021           ?     GFRNONAA                 48 (L)              08/05/2021           ? ?ECHO 01/21: ??1. Left ventricular ejection fraction, by visual estimation, is 60 to  ?65%. The left ventricle has normal function. There is mildly increased  ?left ventricular hypertrophy.  ??2. Left ventricular diastolic parameters are consistent with Grade II  ?diastolic dysfunction (pseudonormalization). The mean left atrial pressure  ?is high.  ??3. The left ventricle has no regional wall motion abnormalities.  ??4. Global right ventricle has normal systolic function.The right  ?ventricular size is normal. No increase in right ventricular wall  ?thickness.  ??5. Left atrial size was mildly dilated.  ??6. Right atrial size was mildly dilated.  ??  7. Mild mitral annular calcification.  ??8. The mitral valve is normal in structure. Trivial mitral valve  ?regurgitation.  ??9. The tricuspid valve is normal in structure.  ?10. The tricuspid valve is normal in structure. Tricuspid valve  ?regurgitation is mild-moderate.  ?11. The aortic valve is normal in structure.  Aortic valve regurgitation is  ?trivial.  ?12. The pulmonic valve was not well visualized. Pulmonic valve  ?regurgitation is not visualized.  ?13. Mildly elevated pulmonary artery systolic pressure.  ?14. The tricuspid regurgitant velocity is 2.18 m/s, and with an assumed  ?right atrial pressure of 15 mmHg, the estimated right ventricular systolic  ?pressure is mildly elevated at 34.0 mmHg.  ?15. The inferior vena cava is dilated in size with <50% respiratory  ?variability, suggesting right atrial pressure of 15 mmHg.  ?16. There is exaggerated respiratory displacement of the interventricular  ?septum. This could represent contrictive physiology , but can also be seen  ?with increased work of breathing (e.g. COPD or asthma exacerbation).  ?17. Compared to 03/09/2019, left ventricular wall motion and overall  ?systolic function have improved. )  ? ? ? ? ? ? ?Anesthesia Quick Evaluation ? ?

## 2021-08-05 NOTE — Anesthesia Procedure Notes (Signed)
Procedure Name: Intubation ?Date/Time: 08/05/2021 3:33 PM ?Performed by: Alain Marion, CRNA ?Pre-anesthesia Checklist: Patient identified, Emergency Drugs available, Suction available and Patient being monitored ?Patient Re-evaluated:Patient Re-evaluated prior to induction ?Oxygen Delivery Method: Circle system utilized ?Preoxygenation: Pre-oxygenation with 100% oxygen ?Induction Type: IV induction and Rapid sequence ?Laryngoscope Size: Sabra Heck and 2 ?Grade View: Grade I ?Tube type: Oral ?Tube size: 7.0 mm ?Number of attempts: 1 ?Airway Equipment and Method: Stylet ?Placement Confirmation: ETT inserted through vocal cords under direct vision, positive ETCO2 and breath sounds checked- equal and bilateral ?Secured at: 21 cm ?Tube secured with: Tape ?Dental Injury: Teeth and Oropharynx as per pre-operative assessment  ? ? ? ? ?

## 2021-08-05 NOTE — H&P (Signed)
?History and Physical  ? ? ?DOA: 08/05/2021 ? ?PCP: Alvester Morin, MD  ?Patient coming from: SNF ? ?Chief Complaint: Unable to swallow ? ?HPI: Betty Jackson is a 87 y.o. female with history h/o CVA, paroxysmal atrial fibrillation on Eliquis, rheumatoid arthritis/giant cell arteritis on methotrexate/chronic prednisone, CKD stage IIIa, CAD, GERD, hyperlipidemia, depression/anxiety, osteoporosis, chronic back pain on fentanyl patch, chronic hypoxic respiratory failure on baseline 2 L O2 presents from SNF with complaints of dysphagia.  Patient does have history of dysphagia and has had esophageal dilatations by GI in the past.  Per daughter, patient was eating okay last week and over the weekend when she actually went home with daughter for respite.  Yesterday morning however she had trouble swallowing and had 1 episode of vomiting at nursing facility after which she did not have any further meals.  When daughter called her this morning, patient apparently complained of throat pain and also chest discomfort which felt like her "fluid-filled heart" before.  This concerned family and also nursing facility staff concerned about ongoing dysphagia and prompted ED referral.  Daughter also reports that patient was noted to be hypoxic at the facility couple of days back and was placed on 2 L O2 nasal cannula. ?ED course: Tmax 100 F.  SBP 1 12-1 50s.  Hypoxic and saturating well on 3 L.  Labs show WBC 14.9, hemoglobin 11.6, BUN 22, creatinine 1.12, glucose 103.  Chest x-ray showed bibasilar atelectasis.  No infiltrates.  Patient seen by GI and plan for endoscopy. ? ? ?Review of Systems: As per HPI, otherwise review of systems negative.  ? ? ?Past Medical History:  ?Diagnosis Date  ? Acute respiratory failure with hypoxia ([place]) 05/20/2017  ? Anemia, unspecified 10/28/2012  ? Anxiety state, unspecified 10/28/2012  ? CAD (coronary artery disease)   ? a. [facility];  b. Cath approx 2009 - nonobs per pt  report.  ? Cataract   ? immature on the left eye  ? Chronic insomnia 02/07/2013  ? Chronic lower back pain   ? scoliosis  ? CKD (chronic kidney disease) stage 3, GFR 30-59 ml/min (HCC)   ? CVA (cerebral infarction) 10/29/2012  ? DDD (degenerative disc disease)   ? Diverticulosis   ? GERD (gastroesophageal reflux disease) 09/01/2010  ? Hemorrhoids   ? Herniated nucleus pulposus, L5-S1, right 11/04/2015  ? Hyperlipidemia   ? takes Lipitor daily  ? Hypertension   ? takes Amlodipine,Losartan,Metoprolol,and HCTZ daily  ? Incontinence of urine   ? Insomnia   ? takes Restoril nightly  ? Lumbar stenosis 04/25/2013  ? Major depressive disorder, recurrent episode, moderate ([place]) 07/16/2013  ? Osteoporosis   ? PAF (paroxysmal atrial fibrillation) ([place]) 2011  ? a. lone epidode in 2011 according to notes.  ? Rheumatoid arthritis ([place])   ? Scoliosis   ? Sepsis ([place]) 05/2019  ? Stroke [facility])   ? Temporal arteritis ([place]) 2011  ? a. followed @ [facility]; potential flareup 10/28/2012/notes 10/28/2012  ? Vocal cord dysfunction   ? "they don't operate properly" (10/28/2012)  ? ? ?Past Surgical History:  ?Procedure Laterality Date  ? ABDOMINAL HYSTERECTOMY  ~ 1984  ? vaginally  ? BACK SURGERY  7-38yr ago  ? X Stop  ? BLADDER SUSPENSION  2001  ? BREAST BIOPSY Right   ? CATARACT EXTRACTION W/ INTRAOCULAR LENS IMPLANT Right ~ 08/2012  ? CHEST TUBE INSERTION Left 03/08/2019  ? Procedure: Chest Tube Insertion;  Surgeon: VIvin Poot MD;  Location: [place] OR;  Service: Thoracic;  Laterality: Left;  ? COLONOSCOPY  01/26/2012  ? Procedure: COLONOSCOPY;  Surgeon: Ladene Artist, MD,FACG;  [facility] ENDOSCOPY;  Service: Endoscopy;  Laterality: N/A;  note the EGD is possible  ? CORONARY ANGIOPLASTY WITH STENT PLACEMENT  2006  ? X 1 stent  ? EPIDURAL BLOCK INJECTION    ? ESOPHAGOGASTRODUODENOSCOPY  01/26/2012  ? Procedure: ESOPHAGOGASTRODUODENOSCOPY (EGD);  Surgeon: Ladene Artist, MD,FACG;  Location: [facility] ENDOSCOPY;  Service: Endoscopy;  Laterality: N/A;  ?  FINE NEEDLE ASPIRATION Right 09/26/2019  ? Procedure: FINE NEEDLE ASPIRATION;  Surgeon: Altamese Chalkyitsik, MD;  Location: [place];  Service: Orthopedics;  Laterality: Right;  ? HEMIARTHROPLASTY HIP Right 2012  ? IR EPIDUROGRAPHY  04/20/2019  ? LAPAROSCOPIC CHOLECYSTECTOMY  2001  ? LUMBAR FUSION  03/2013  ? LUMBAR LAMINECTOMY/DECOMPRESSION MICRODISCECTOMY Right 11/04/2015  ? Procedure: Right Lumbar Five-Sacral One Microdiskectomy;  Surgeon: Kristeen Miss, MD;  Location: [place] NEURO ORS;  Service: Neurosurgery;  Laterality: Right;  Right L5-S1 Microdiskectomy  ? moles removed that required stiches    ? one on leg and one on face  ? ORIF FEMUR FRACTURE Right 09/26/2019  ? Procedure: OPEN REDUCTION INTERNAL FIXATION (ORIF) DISTAL FEMUR FRACTURE;  Surgeon: Altamese What Cheer, MD;  Location: [place];  Service: Orthopedics;  Laterality: Right;  ? ORIF FEMUR FRACTURE Right 10/17/2019  ? Procedure: OPEN REDUCTION INTERNAL FIXATION (ORIF) DISTAL FEMUR FRACTURE;  Surgeon: Altamese [place], MD;  Location: [place];  Service: Orthopedics;  Laterality: Right;  ? SUBXYPHOID PERICARDIAL WINDOW N/A 03/08/2019  ? Procedure: SUBXYPHOID PERICARDIAL WINDOW;  Surgeon: Ivin Poot, MD;  Location: [place];  Service: Thoracic;  Laterality: N/A;  ? TEE WITHOUT CARDIOVERSION N/A 03/08/2019  ? Procedure: TRANSESOPHAGEAL ECHOCARDIOGRAM (TEE);  Surgeon: Prescott Gum, Collier Salina, MD;  Location: [place];  Service: Thoracic;  Laterality: N/A;  ? TEMPORAL ARTERY BIOPSY / LIGATION Bilateral 2011  ? TONSILLECTOMY AND ADENOIDECTOMY    ? at age 46  ? [place]  ~ 56  ? X-STOP IMPLANTATION  ~ 2010  ? "lower back" (10/28/2012)  ? ? ?Social history: ? reports that she has never smoked. She has never used smokeless tobacco. She reports current alcohol use. She reports that she does not use drugs. ? ? ?Allergies  ?Allergen Reactions  ? Dextromethorphan Rash  ? ? ?Family History  ?Problem Relation Age of Onset  ? Brain cancer Mother 17  ? Heart attack Father   ? Breast cancer Daughter 61   ? Heart disease Other   ? Hypertension Other   ? Arthritis Neg Hx   ? Colon cancer Neg Hx   ? Osteoporosis Neg Hx   ? ? ? ? ?Prior to Admission medications   ?Medication Sig Start Date End Date Taking? Authorizing Provider  ?acetaminophen (TYLENOL) 500 MG tablet Take 500 mg by mouth every 6 (six) hours as needed.    [provider]  ?alendronate (FOSAMAX) 70 MG tablet Take 1 tablet (70 mg total) by mouth every 7 (seven) days. Take with a full glass of water on an empty stomach. ?Patient taking differently: Take 70 mg by mouth every Thursday. 06/14/20   Burchette, Alinda Sierras, MD  ?allopurinol (ZYLOPRIM) 100 MG tablet Take 100 mg by mouth daily.    [provider]  ?atorvastatin (LIPITOR) 80 MG tablet Take 1 tablet (80 mg total) by mouth daily. ?Patient taking differently: Take 80 mg by mouth at bedtime. 09/25/20   Garvin Fila, MD  ?cetirizine Alethia Berthold)  10 MG tablet Take 10 mg by mouth at bedtime.    [provider]  ?cholecalciferol (VITAMIN D) 25 MCG tablet Take 2 tablets (2,000 Units total) by mouth 2 (two) times daily. 09/29/19   Ainsley Spinner, PA-C  ?diltiazem (CARDIZEM CD) 240 MG 24 hr capsule Take 1 capsule (240 mg total) by mouth daily. 03/28/20   Burnell Blanks, MD  ?ELIQUIS 5 MG TABS tablet TAKE 1 TABLET BY MOUTH TWICE A DAY ?Patient taking differently: Take 5 mg by mouth 2 (two) times daily. 05/09/20   Burnell Blanks, MD  ?escitalopram (LEXAPRO) 10 MG tablet TAKE 1 TABLET BY MOUTH EVERY DAY ?Patient taking differently: Take 10 mg by mouth daily. 08/27/20   Burchette, Alinda Sierras, MD  ?fentaNYL (DURAGESIC) 12 MCG/HR Place 1 patch onto the skin every 3 (three) days. 03/21/21   Oswald Hillock, MD  ?fentaNYL (DURAGESIC) 25 MCG/HR Place 1 patch onto the skin every 3 (three) days. 03/21/21   Oswald Hillock, MD  ?fentaNYL 37.5 MCG/HR PT72 Place 1 patch onto the skin every 3 (three) days. 04/16/21   [provider]  ?ferrous sulfate 325 (65 FE) MG tablet Take 1 tablet (325 mg  total) by mouth 2 (two) times daily with a meal. 04/21/19   Oswald Hillock, MD  ?furosemide (LASIX) 40 MG tablet Take 1 tablet (40 mg total) by mouth daily. Please keep upcoming Dr. appt for future refil

## 2021-08-05 NOTE — Consult Note (Addendum)
? ?                                                                          [facility] Consult: ?11:40 AM ?08/05/2021 ? LOS: 0 days  ? ? ?Referring Provider: Dr Kathrynn Humble.    ?Primary Care Physician:  Alvester Morin, MD in [place].   ?Primary Gastroenterologist:  Dr. Fuller Plan.    ? ? ? ?Reason for Consultation: Dysphagia. ?  ?HPI: Betty Jackson is a 87 y.o. female.  SNF resident.  PMH see extensive listing below but includes COPD, emphysema.  PAF.  Chronic Eliquis.  Osteoporosis, on weekly Fosamax.  CAD, stenting 2016.  CVA.  Chronic diastolic heart failure.  Giant cell arteritis, on daily prednisone..  Lumbar, sacral spine fractures.  S/p lumbar fusion.  S/p right total hip replacement.  Labeled as vocal cord dysfunction in 2014.  C. difficile colitis diagnosed March 2021 treated with vancomycin pulsed taper with resolution of diarrhea.  [place] anemia.  GERD. ?Last hospitalization was in mid December 2022 with encephalopathy attributed to polypharmacy, AKI, labeled as failure to thrive.  She is on multiple chronic pain meds.  Wheelchair-bound at baseline.   ? ?12/2011 EGD.  To D2.  For evaluation abdominal pain.  Small hiatal hernia, otherwise normal study. ?12/2011 colonoscopy.  Sigmoid and descending diverticulosis.  Nonbleeding moderate-sized, internal/external hemorrhoids.  Normal TI. ?04/2012 CTAP with angiography for back and abdominal pain.  Patent visceral vessels, no findings to support diagnosis of mesenteric ischemia.  Slight variant renal anatomy with right sided dual renal arteries.  Uncomplicated colonic diverticulosis.  Lumbar spine scoliosis. ?05/28/2019 CTAP with contrast showed some perisigmoid stranding, likely chronic.  Sigmoid diverticulosis.  Mild acute diverticulitis not excluded. ?09/2019 modified barium swallow study.  OP swallowing mechanism normal.  Transient penetration with thin liquids  considered within normal limits.  Recommended regular texture diet and thin liquids along with swallowing precautions. ? ?Underwent multiple tooth extractions around March 17, she thinks they removed 10 or 11 teeth.  Eliquis was held for this and when she restarted it, she started bleeding from her gums so it was held again but she has since resumed Eliquis.  Last dose was yesterday ?Sent from SNF today with complaint of productive cough and difficulty swallowing.  Acute onset solid/liquid dysphagia started early yesterday morning.  Feels food getting stuck at level of upper cervical esophagus, back of her throat.  When she tries to swallow contents of food regurgitate back up.  No recall of acute dysphagia while eating the previous day.  She is coughing up blood-tinged material as well and clearing both esophageal contents and pulmonary based sputum.  No prior history of food impaction.  Normally takes her weekly Fosamax on Thursdays and denies difficulty swallowing this pill.  No outpt PPI or H2B.  Takes Mobic daily ?Oxygen sats on 2 L nasal cannula oxygen decreased to 85% so flow rate increased to 3 L with improvement to 97% sats. ? ?Single view CXR shows mild, bibasilar opacities likely representing atelectasis. ?WBCs 14.9.  Hb 11.6.  MCV 98.  Platelets 214. ?GFR 48, slightly improved from 45 in mid December.  With the exception of low albumin at 3.2, LFTs normal. ?  ? ?Past  Medical History:  ?Diagnosis Date  ? Acute respiratory failure with hypoxia ([place]) 05/20/2017  ? Anemia, unspecified 10/28/2012  ? Anxiety state, unspecified 10/28/2012  ? CAD (coronary artery disease)   ? a. Stent RCA in [facility];  b. Cath approx 2009 - nonobs per pt report.  ? Cataract   ? immature on the left eye  ? Chronic insomnia 02/07/2013  ? Chronic lower back pain   ? scoliosis  ? CKD (chronic kidney disease) stage 3, GFR 30-59 ml/min (HCC)   ? CVA (cerebral infarction) 10/29/2012  ? DDD (degenerative disc disease)   ?  Diverticulosis   ? GERD (gastroesophageal reflux disease) 09/01/2010  ? Hemorrhoids   ? Herniated nucleus pulposus, L5-S1, right 11/04/2015  ? Hyperlipidemia   ? takes Lipitor daily  ? Hypertension   ? takes Amlodipine,Losartan,Metoprolol,and HCTZ daily  ? Incontinence of urine   ? Insomnia   ? takes Restoril nightly  ? Lumbar stenosis 04/25/2013  ? Major depressive disorder, recurrent episode, moderate ([place]) 07/16/2013  ? Osteoporosis   ? PAF (paroxysmal atrial fibrillation) ([place]) 2011  ? a. lone epidode in 2011 according to notes.  ? Rheumatoid arthritis ([place])   ? Scoliosis   ? Sepsis ([place]) 05/2019  ? Stroke [facility])   ? Temporal arteritis ([place]) 2011  ? a. followed @ [facility]; potential flareup 10/28/2012/notes 10/28/2012  ? Vocal cord dysfunction   ? "they don't operate properly" (10/28/2012)  ? ? ?Past Surgical History:  ?Procedure Laterality Date  ? ABDOMINAL HYSTERECTOMY  ~ 1984  ? vaginally  ? BACK SURGERY  7-70yr ago  ? X Stop  ? BLADDER SUSPENSION  2001  ? BREAST BIOPSY Right   ? CATARACT EXTRACTION W/ INTRAOCULAR LENS IMPLANT Right ~ 08/2012  ? CHEST TUBE INSERTION Left 03/08/2019  ? Procedure: Chest Tube Insertion;  Surgeon: VIvin Poot MD;  [place]  Service: Thoracic;  Laterality: Left;  ? COLONOSCOPY  01/26/2012  ? Procedure: COLONOSCOPY;  Surgeon: MLadene Artist MD,FACG;  [facility]  Service: Endoscopy;  Laterality: N/A;  note the EGD is possible  ? CORONARY ANGIOPLASTY WITH STENT PLACEMENT  2006  ? X 1 stent  ? EPIDURAL BLOCK INJECTION    ? ESOPHAGOGASTRODUODENOSCOPY  01/26/2012  ? Procedure: ESOPHAGOGASTRODUODENOSCOPY (EGD);  Surgeon: MLadene Artist MD,FACG;  [facility]  Service: Endoscopy;  Laterality: N/A;  ? FINE NEEDLE ASPIRATION Right 09/26/2019  ? Procedure: FINE NEEDLE ASPIRATION;  Surgeon: HAltamese Parker MD;  [place]  Service: Orthopedics;  Laterality: Right;  ? HEMIARTHROPLASTY HIP Right 2012  ? IR EPIDUROGRAPHY  04/20/2019  ? LAPAROSCOPIC  CHOLECYSTECTOMY  2001  ? LUMBAR FUSION  03/2013  ? LUMBAR LAMINECTOMY/DECOMPRESSION MICRODISCECTOMY Right 11/04/2015  ? Procedure: Right Lumbar Five-Sacral One Microdiskectomy;  Surgeon: HKristeen Miss MD;  [facility];  Service: Neurosurgery;  Laterality: Right;  Right L5-S1 Microdiskectomy  ? moles removed that required stiches    ? one on leg and one on face  ? ORIF FEMUR FRACTURE Right 09/26/2019  ? Procedure: OPEN REDUCTION INTERNAL FIXATION (ORIF) DISTAL FEMUR FRACTURE;  Surgeon: HAltamese [place] MD;  [place] [facility]  Service: Orthopedics;  Laterality: Right;  ? ORIF FEMUR FRACTURE Right 10/17/2019  ? Procedure: OPEN REDUCTION INTERNAL FIXATION (ORIF) DISTAL FEMUR FRACTURE;  Surgeon: HAltamese [place] MD;  [place]  Service: Orthopedics;  Laterality: Right;  ? SUBXYPHOID PERICARDIAL WINDOW N/A 03/08/2019  ? Procedure: SUBXYPHOID PERICARDIAL WINDOW;  Surgeon: VIvin Poot MD;  [facility];  Service: Thoracic;  Laterality: N/A;  ? TEE WITHOUT CARDIOVERSION N/A 03/08/2019  ? Procedure: TRANSESOPHAGEAL ECHOCARDIOGRAM (TEE);  Surgeon: Prescott Gum, Collier Salina, MD;  Location: [place];  Service: Thoracic;  Laterality: N/A;  ? TEMPORAL ARTERY BIOPSY / LIGATION Bilateral 2011  ? TONSILLECTOMY AND ADENOIDECTOMY    ? at age 29  ? [place]  ~ 85  ? X-STOP IMPLANTATION  ~ 2010  ? "lower back" (10/28/2012)  ? ? ?Prior to Admission medications   ?Medication Sig Start Date End Date Taking? Authorizing Provider  ?acetaminophen (TYLENOL) 500 MG tablet Take 500 mg by mouth every 6 (six) hours as needed.    [provider]  ?alendronate (FOSAMAX) 70 MG tablet Take 1 tablet (70 mg total) by mouth every 7 (seven) days. Take with a full glass of water on an empty stomach. ?Patient taking differently: Take 70 mg by mouth every Thursday. 06/14/20   Burchette, Alinda Sierras, MD  ?allopurinol (ZYLOPRIM) 100 MG tablet Take 100 mg by mouth daily.    [provider]  ?atorvastatin (LIPITOR) 80 MG tablet Take 1  tablet (80 mg total) by mouth daily. ?Patient taking differently: Take 80 mg by mouth at bedtime. 09/25/20   Garvin Fila, MD  ?cetirizine (ZYRTEC) 10 MG tablet Take 10 mg by mouth at bedtime.    [provider]  ?

## 2021-08-06 ENCOUNTER — Inpatient Hospital Stay (HOSPITAL_COMMUNITY): Payer: Medicare Other

## 2021-08-06 DIAGNOSIS — F32A Depression, unspecified: Secondary | ICD-10-CM | POA: Diagnosis present

## 2021-08-06 DIAGNOSIS — I480 Paroxysmal atrial fibrillation: Secondary | ICD-10-CM | POA: Diagnosis present

## 2021-08-06 DIAGNOSIS — Z79891 Long term (current) use of opiate analgesic: Secondary | ICD-10-CM | POA: Diagnosis not present

## 2021-08-06 DIAGNOSIS — I2510 Atherosclerotic heart disease of native coronary artery without angina pectoris: Secondary | ICD-10-CM | POA: Diagnosis present

## 2021-08-06 DIAGNOSIS — I5032 Chronic diastolic (congestive) heart failure: Secondary | ICD-10-CM | POA: Diagnosis present

## 2021-08-06 DIAGNOSIS — R1314 Dysphagia, pharyngoesophageal phase: Secondary | ICD-10-CM | POA: Diagnosis present

## 2021-08-06 DIAGNOSIS — I130 Hypertensive heart and chronic kidney disease with heart failure and stage 1 through stage 4 chronic kidney disease, or unspecified chronic kidney disease: Secondary | ICD-10-CM | POA: Diagnosis present

## 2021-08-06 DIAGNOSIS — K209 Esophagitis, unspecified without bleeding: Secondary | ICD-10-CM | POA: Diagnosis not present

## 2021-08-06 DIAGNOSIS — I4891 Unspecified atrial fibrillation: Secondary | ICD-10-CM

## 2021-08-06 DIAGNOSIS — I471 Supraventricular tachycardia: Secondary | ICD-10-CM | POA: Diagnosis not present

## 2021-08-06 DIAGNOSIS — E861 Hypovolemia: Secondary | ICD-10-CM | POA: Diagnosis present

## 2021-08-06 DIAGNOSIS — E1122 Type 2 diabetes mellitus with diabetic chronic kidney disease: Secondary | ICD-10-CM | POA: Diagnosis present

## 2021-08-06 DIAGNOSIS — M069 Rheumatoid arthritis, unspecified: Secondary | ICD-10-CM | POA: Diagnosis present

## 2021-08-06 DIAGNOSIS — I248 Other forms of acute ischemic heart disease: Secondary | ICD-10-CM | POA: Diagnosis not present

## 2021-08-06 DIAGNOSIS — I959 Hypotension, unspecified: Secondary | ICD-10-CM | POA: Diagnosis not present

## 2021-08-06 DIAGNOSIS — M316 Other giant cell arteritis: Secondary | ICD-10-CM | POA: Diagnosis present

## 2021-08-06 DIAGNOSIS — R1310 Dysphagia, unspecified: Secondary | ICD-10-CM | POA: Diagnosis not present

## 2021-08-06 DIAGNOSIS — J439 Emphysema, unspecified: Secondary | ICD-10-CM | POA: Diagnosis present

## 2021-08-06 DIAGNOSIS — K2101 Gastro-esophageal reflux disease with esophagitis, with bleeding: Secondary | ICD-10-CM | POA: Diagnosis present

## 2021-08-06 DIAGNOSIS — N1832 Chronic kidney disease, stage 3b: Secondary | ICD-10-CM | POA: Diagnosis present

## 2021-08-06 DIAGNOSIS — Z66 Do not resuscitate: Secondary | ICD-10-CM | POA: Diagnosis present

## 2021-08-06 DIAGNOSIS — D631 Anemia in chronic kidney disease: Secondary | ICD-10-CM | POA: Diagnosis present

## 2021-08-06 DIAGNOSIS — E669 Obesity, unspecified: Secondary | ICD-10-CM | POA: Diagnosis present

## 2021-08-06 DIAGNOSIS — R778 Other specified abnormalities of plasma proteins: Secondary | ICD-10-CM | POA: Diagnosis not present

## 2021-08-06 DIAGNOSIS — J9621 Acute and chronic respiratory failure with hypoxia: Secondary | ICD-10-CM | POA: Diagnosis present

## 2021-08-06 DIAGNOSIS — K2970 Gastritis, unspecified, without bleeding: Secondary | ICD-10-CM | POA: Diagnosis not present

## 2021-08-06 DIAGNOSIS — M549 Dorsalgia, unspecified: Secondary | ICD-10-CM | POA: Diagnosis present

## 2021-08-06 DIAGNOSIS — J9601 Acute respiratory failure with hypoxia: Secondary | ICD-10-CM | POA: Diagnosis not present

## 2021-08-06 DIAGNOSIS — I25119 Atherosclerotic heart disease of native coronary artery with unspecified angina pectoris: Secondary | ICD-10-CM | POA: Diagnosis not present

## 2021-08-06 DIAGNOSIS — G8929 Other chronic pain: Secondary | ICD-10-CM | POA: Diagnosis present

## 2021-08-06 DIAGNOSIS — J690 Pneumonitis due to inhalation of food and vomit: Secondary | ICD-10-CM | POA: Diagnosis not present

## 2021-08-06 DIAGNOSIS — R1319 Other dysphagia: Secondary | ICD-10-CM | POA: Diagnosis present

## 2021-08-06 DIAGNOSIS — K2091 Esophagitis, unspecified with bleeding: Secondary | ICD-10-CM | POA: Diagnosis not present

## 2021-08-06 LAB — BASIC METABOLIC PANEL
Anion gap: 8 (ref 5–15)
Anion gap: 9 (ref 5–15)
Anion gap: 12 (ref 5–15)
BUN: 28 mg/dL — ABNORMAL HIGH (ref 8–23)
BUN: 23 mg/dL (ref 8–23)
BUN: 22 mg/dL (ref 8–23)
CO2: 24 mmol/L (ref 22–32)
CO2: 25 mmol/L (ref 22–32)
CO2: 24 mmol/L (ref 22–32)
Calcium: 8.1 mg/dL — ABNORMAL LOW (ref 8.9–10.3)
Calcium: 8 mg/dL — ABNORMAL LOW (ref 8.9–10.3)
Calcium: 8 mg/dL — ABNORMAL LOW (ref 8.9–10.3)
Chloride: 110 mmol/L (ref 98–111)
Chloride: 109 mmol/L (ref 98–111)
Chloride: 109 mmol/L (ref 98–111)
Creatinine, Ser: 1.21 mg/dL — ABNORMAL HIGH (ref 0.44–1.00)
Creatinine, Ser: 0.98 mg/dL (ref 0.44–1.00)
Creatinine, Ser: 1.04 mg/dL — ABNORMAL HIGH (ref 0.44–1.00)
GFR, Estimated: 44 mL/min — ABNORMAL LOW (ref >60)
GFR, Estimated: 57 mL/min — ABNORMAL LOW (ref >60)
GFR, Estimated: 53 mL/min — ABNORMAL LOW (ref >60)
Glucose, Bld: 96 mg/dL (ref 70–99)
Glucose, Bld: 149 mg/dL — ABNORMAL HIGH (ref 70–99)
Glucose, Bld: 151 mg/dL — ABNORMAL HIGH (ref 70–99)
Potassium: 5.2 mmol/L — ABNORMAL HIGH (ref 3.5–5.1)
Potassium: 4.1 mmol/L (ref 3.5–5.1)
Potassium: 4.4 mmol/L (ref 3.5–5.1)
Sodium: 142 mmol/L (ref 135–145)
Sodium: 143 mmol/L (ref 135–145)
Sodium: 145 mmol/L (ref 135–145)

## 2021-08-06 LAB — URINALYSIS, ROUTINE W REFLEX MICROSCOPIC
Bacteria, UA: NONE SEEN
Bilirubin Urine: NEGATIVE
Glucose, UA: NEGATIVE mg/dL
Ketones, ur: 5 mg/dL — AB
Nitrite: NEGATIVE
Protein, ur: NEGATIVE mg/dL
Specific Gravity, Urine: 1.015 (ref 1.005–1.030)
pH: 5 (ref 5.0–8.0)

## 2021-08-06 LAB — CBC
HCT: 35.5 % — ABNORMAL LOW (ref 36.0–46.0)
HCT: 36.8 % (ref 36.0–46.0)
Hemoglobin: 10.9 g/dL — ABNORMAL LOW (ref 12.0–15.0)
Hemoglobin: 11.5 g/dL — ABNORMAL LOW (ref 12.0–15.0)
MCH: 30 pg (ref 26.0–34.0)
MCH: 30.4 pg (ref 26.0–34.0)
MCHC: 30.7 g/dL (ref 30.0–36.0)
MCHC: 31.3 g/dL (ref 30.0–36.0)
MCV: 97.8 fL (ref 80.0–100.0)
MCV: 97.4 fL (ref 80.0–100.0)
Platelets: 147 10*3/uL — ABNORMAL LOW (ref 150–400)
Platelets: 193 10*3/uL (ref 150–400)
RBC: 3.63 MIL/uL — ABNORMAL LOW (ref 3.87–5.11)
RBC: 3.78 MIL/uL — ABNORMAL LOW (ref 3.87–5.11)
RDW: 15.2 % (ref 11.5–15.5)
RDW: 14.9 % (ref 11.5–15.5)
WBC: 12.4 10*3/uL — ABNORMAL HIGH (ref 4.0–10.5)
WBC: 12.6 10*3/uL — ABNORMAL HIGH (ref 4.0–10.5)
nRBC: 0 % (ref 0.0–0.2)
nRBC: 0 % (ref 0.0–0.2)

## 2021-08-06 LAB — AMMONIA: Ammonia: 19 umol/L (ref 9–35)

## 2021-08-06 LAB — LACTIC ACID, PLASMA
Lactic Acid, Venous: 1.5 mmol/L (ref 0.5–1.9)
Lactic Acid, Venous: 1.3 mmol/L (ref 0.5–1.9)

## 2021-08-06 LAB — BLOOD GAS, ARTERIAL
Acid-Base Excess: 1.7 mmol/L (ref 0.0–2.0)
Bicarbonate: 27.7 mmol/L (ref 20.0–28.0)
O2 Saturation: 95.3 %
Patient temperature: 37
pCO2 arterial: 48 mmHg (ref 32–48)
pH, Arterial: 7.37 (ref 7.35–7.45)
pO2, Arterial: 75 mmHg — ABNORMAL LOW (ref 83–108)

## 2021-08-06 LAB — GLUCOSE, CAPILLARY: Glucose-Capillary: 159 mg/dL — ABNORMAL HIGH (ref 70–99)

## 2021-08-06 LAB — TROPONIN I (HIGH SENSITIVITY)
Troponin I (High Sensitivity): 68 ng/L — ABNORMAL HIGH (ref <18)
Troponin I (High Sensitivity): 78 ng/L — ABNORMAL HIGH (ref <18)

## 2021-08-06 LAB — TSH: TSH: 0.801 u[IU]/mL (ref 0.350–4.500)

## 2021-08-06 MED ORDER — HYDROCORTISONE SOD SUC (PF) 250 MG IJ SOLR
150.0000 mg | Freq: Two times a day (BID) | INTRAMUSCULAR | Status: DC
Start: 2021-08-06 — End: 2021-08-07
  Administered 2021-08-06 (×2): 150 mg via INTRAVENOUS
  Filled 2021-08-06 (×3): qty 150

## 2021-08-06 MED ORDER — ACETAMINOPHEN 325 MG PO TABS
650.0000 mg | ORAL_TABLET | ORAL | Status: DC | PRN
Start: 2021-08-06 — End: 2021-08-11
  Administered 2021-08-08 – 2021-08-09 (×2): 650 mg via ORAL
  Filled 2021-08-06 (×2): qty 2

## 2021-08-06 MED ORDER — AMIODARONE LOAD VIA INFUSION
150.0000 mg | Freq: Once | INTRAVENOUS | Status: AC
  Administered 2021-08-06: 150 mg via INTRAVENOUS
  Filled 2021-08-06: qty 83.34

## 2021-08-06 MED ORDER — AMIODARONE HCL IN DEXTROSE 360-4.14 MG/200ML-% IV SOLN
30.0000 mg/h | INTRAVENOUS | Status: DC
  Administered 2021-08-07 – 2021-08-08 (×3): 30 mg/h via INTRAVENOUS
  Filled 2021-08-06 (×3): qty 200

## 2021-08-06 MED ORDER — METOPROLOL TARTRATE 5 MG/5ML IV SOLN
5.0000 mg | Freq: Once | INTRAVENOUS | Status: AC
  Administered 2021-08-06: 5 mg via INTRAVENOUS
  Filled 2021-08-06: qty 5

## 2021-08-06 MED ORDER — SODIUM CHLORIDE 0.9 % IV BOLUS
500.0000 mL | Freq: Once | INTRAVENOUS | Status: AC
  Administered 2021-08-06: 500 mL via INTRAVENOUS

## 2021-08-06 MED ORDER — DILTIAZEM LOAD VIA INFUSION: 10.0000 mg | Freq: Once | INTRAVENOUS | Status: DC

## 2021-08-06 MED ORDER — METOPROLOL TARTRATE 5 MG/5ML IV SOLN
2.5000 mg | Freq: Once | INTRAVENOUS | Status: AC
Start: 2021-08-06 — End: 2021-08-06
  Administered 2021-08-06: 2.5 mg via INTRAVENOUS
  Filled 2021-08-06: qty 5

## 2021-08-06 MED ORDER — ACETAMINOPHEN 650 MG RE SUPP
650.0000 mg | RECTAL | Status: DC | PRN
  Administered 2021-08-06: 650 mg via RECTAL
  Filled 2021-08-06: qty 1

## 2021-08-06 MED ORDER — AMIODARONE HCL IN DEXTROSE 360-4.14 MG/200ML-% IV SOLN
60.0000 mg/h | INTRAVENOUS | Status: DC
  Administered 2021-08-06 (×2): 60 mg/h via INTRAVENOUS
  Filled 2021-08-06 (×2): qty 200

## 2021-08-06 MED ORDER — DILTIAZEM LOAD VIA INFUSION
10.0000 mg | Freq: Once | INTRAVENOUS | Status: AC
  Administered 2021-08-06: 10 mg via INTRAVENOUS
  Filled 2021-08-06: qty 10

## 2021-08-06 MED ORDER — BOOST / RESOURCE BREEZE PO LIQD CUSTOM
1.0000 | Freq: Three times a day (TID) | ORAL | Status: DC
Start: 2021-08-06 — End: 2021-08-11
  Administered 2021-08-07 – 2021-08-10 (×8): 1 via ORAL

## 2021-08-06 MED ORDER — NITROGLYCERIN 0.4 MG SL SUBL: 0.4000 mg | SUBLINGUAL_TABLET | SUBLINGUAL | Status: DC | PRN

## 2021-08-06 MED ORDER — DILTIAZEM HCL-DEXTROSE 125-5 MG/125ML-% IV SOLN (PREMIX)
5.0000 mg/h | INTRAVENOUS | Status: DC
  Administered 2021-08-06: 5 mg/h via INTRAVENOUS
  Filled 2021-08-06: qty 125

## 2021-08-06 MED ORDER — SODIUM ZIRCONIUM CYCLOSILICATE 10 G PO PACK
10.0000 g | PACK | Freq: Once | ORAL | Status: AC
  Administered 2021-08-06: 10 g via ORAL
  Filled 2021-08-06: qty 1

## 2021-08-06 MED ORDER — METOPROLOL TARTRATE 100 MG PO TABS
100.0000 mg | ORAL_TABLET | Freq: Two times a day (BID) | ORAL | Status: DC
  Filled 2021-08-06: qty 1

## 2021-08-06 MED ORDER — DILTIAZEM HCL ER COATED BEADS 240 MG PO CP24
240.0000 mg | ORAL_CAPSULE | Freq: Every day | ORAL | Status: DC
  Filled 2021-08-06: qty 1

## 2021-08-06 NOTE — Progress Notes (Signed)
? ?[facility] ?PROGRESS NOTE ? ? ?Betty Jackson OYD:[id number] DOB: [birth year] DOA: 08/05/2021 ? ?PCP: Alvester Morin, MD ? ?Brief History/Interval Summary: 87 y.o. female with history h/o CVA, paroxysmal atrial fibrillation on Eliquis, rheumatoid arthritis/giant cell arteritis on methotrexate/chronic prednisone, CKD stage IIIa, CAD, GERD, hyperlipidemia, depression/anxiety, osteoporosis, chronic back pain on fentanyl patch, chronic hypoxic respiratory failure on baseline 2 L O2 presented from SNF with complaints of dysphagia.  Patient has a history of dysphagia and has had esophageal dilatations by GI in the past.  However this episode appeared to be more of a sudden onset.  Patient was hospitalized for further management.    ? ? ?Consultants: Gastroenterology ? ?Procedures:   ? ?EGD 5/9 ?Impression:               - Severe esophagitis with bleeding. ?                          - Gastritis. ?                          - Normal examined duodenum. ?                          - No specimens collected. ?                          This is more severe reflux than is expected from  ?                          GERD, and medication-induced esophagitis is  ?                          suspected (alendronate). ? ? ? ? ?Subjective/Interval History: ?Patient noted to have elevated heart rate this morning.  Patient complains of some chest discomfort and shortness of breath.  Denies any dizziness or lightheadedness.  No nausea or vomiting.  Daughter is at the bedside. ? ? ? ?Assessment/Plan: ? ?Dysphagia ?EGD was done on 5/9 and shows severe esophagitis.  Evidence for gastritis also noted.  Medication induced esophagitis is suspected.  Patient was on alendronate prior to admission.  This has been held.  Plan is to repeat endoscopy to take biopsies.  But to will need to wait till heart rate is better controlled. ? ?Atrial fibrillation with RVR ?Noted to have elevated heart rate this morning.  EKGs reviewed.  A-fib with RVR is  suspected.  Patient with known history of same.  Could not tolerate other oral medication due to her significant dysphagia.  She is noted to be on Cardizem and metoprolol prior to admission.  Started on Cardizem infusion.  We will give her dose of metoprolol. ?Blood pressure did go down.  Fluid bolus will be ordered.  She remains asymptomatic. ?May need to consider amiodarone infusion for these interventions are not successful. ?She is followed by Dr. Ernest Pine with cardiology. ?Eliquis is currently on hold since bleeding was noted on upper endoscopy and plan is to repeat endoscopy to perform biopsies. ? ?Acute respiratory failure with hypoxia/aspiration pneumonia ?Chest x-ray showed bibasilar infiltrates versus atelectasis. ?WBC was noted to be elevated.  She was noted to be febrile. ?Continue Unasyn due to concern for aspiration pneumonia. ? ?History of chronic diastolic CHF ?Somewhat hypovolemic this  morning.  Has not been able to take much by mouth over the last few days.  Hold her diuretics.  May need IV fluids after the bolus.  Last echocardiogram from 2021 showed normal systolic function. ? ?Chronic kidney disease stage IIIb/mild hyperkalemia ?Renal function close to baseline.  Lokelma ordered earlier today for elevated potassium level.  Recheck labs tomorrow.  Monitor urine output. ? ?Chronic back pain ?Noted to be on fentanyl patch. ? ?Coronary artery disease ?Holding aspirin and anticoagulants.  Chest pain is most likely due to tachyarrhythmia. ? ?History of rheumatoid arthritis/giant cell arteritis ?On prednisone chronically.  Since she is having difficulty swallowing tablets we will place her on low-dose Solu-Medrol. ?Also on methotrexate as an outpatient which is currently on hold. ? ?Osteoporosis ?Holding alendronate due to concern for medication induced esophagitis. ? ?DVT Prophylaxis: Currently on hold.  SCDs ?Code Status: DNR ?Family Communication: Discussed with daughter ?Disposition Plan: From  skilled nursing facility ? ?Status is: Observation ?The patient will require care spanning > 2 midnights and should be moved to inpatient because: Atrial fibrillation with RVR, significant dysphagia, need for repeat endoscopy ? ? ? ? ? ?Medications: Scheduled: ? alum & mag hydroxide-simeth  30 mL Oral Q6H  ? atorvastatin  80 mg Oral Daily  ? feeding supplement  1 Container Oral TID BM  ? [START ON 08/07/2021] fentaNYL  1 patch Transdermal Q72H  ? [START ON 08/07/2021] fentaNYL  1 patch Transdermal Q72H  ? ipratropium-albuterol  3 mL Nebulization BID  ? metoprolol tartrate  100 mg Oral BID  ? pantoprazole (PROTONIX) IV  40 mg Intravenous Q12H  ? ?Continuous: ? ampicillin-sulbactam (UNASYN) IV 1.5 g (08/06/21 0456)  ? diltiazem (CARDIZEM) infusion 5 mg/hr (08/06/21 0834)  ? sodium chloride    ? ?[id number] **OR** acetaminophen, diltiazem, hydrALAZINE, ondansetron (ZOFRAN) IV ? ?Antibiotics: ?Anti-infectives (From admission, onward)  ? ? Start     Dose/Rate Route Frequency Ordered Stop  ? 08/05/21 1830  ampicillin-sulbactam (UNASYN) 1.5 g in sodium chloride 0.9 % 100 mL IVPB       ? 1.5 g ?200 mL/hr over 30 Minutes Intravenous Every 6 hours 08/05/21 1757    ? ?  ? ? ?Objective: ? ?Vital Signs ? ?Vitals:  ? 08/06/21 0806 08/06/21 0830 08/06/21 0851 08/06/21 1000  ?BP: (!) 130/115 111/68 134/69 104/69  ?Pulse: (!) 150   (!) 138  ?Resp: 19   19  ?Temp: 98.2 ?F (36.8 ?C)   98.4 ?F (36.9 ?C)  ?TempSrc: Oral   Oral  ?SpO2: 97%   97%  ?Weight:      ?Height:      ? ? ?Intake/Output Summary (Last 24 hours) at 08/06/2021 1016 ?Last data filed at 08/06/2021 7989 ?Gross per 24 hour  ?Intake 860 ml  ?Output 650 ml  ?Net 210 ml  ? ?Filed Weights  ? 08/05/21 1410 08/05/21 1658 08/06/21 0000  ?Weight: 73.5 kg 77.6 kg 76.6 kg  ? ? ?General appearance: Awake alert.  In no distress ?Resp: Noted to be mildly tachypneic.  Coarse breath sounds bilaterally with crackles at the bases. ?Cardio: S1-S2 tachycardic.  Irregularly  irregular. ?GI: Abdomen is soft.  Nontender nondistended.  Bowel sounds are present normal.  No masses organomegaly ?Extremities: No edema.  Full range of motion of lower extremities. ?Neurologic:  No focal neurological deficits.  ? ? ?Lab Results: ? ?Data Reviewed: I have personally reviewed following labs and reports of the imaging studies ? ?CBC: ?Recent Labs  ?Lab 08/05/21 ?1029 08/06/21 ?  0347  ?WBC 14.9* 12.4*  ?HGB 11.6* 10.9*  ?HCT 37.9 35.5*  ?MCV 98.4 97.8  ?PLT 214 147*  ? ? ?Basic Metabolic Panel: ?Recent Labs  ?Lab 08/05/21 ?1029 08/06/21 ?1610  ?NA 141 142  ?K 4.3 5.2*  ?CL 106 110  ?CO2 30 24  ?GLUCOSE 103* 96  ?BUN 22 28*  ?CREATININE 1.12* 1.21*  ?CALCIUM 8.6* 8.1*  ? ? ?GFR: ?Estimated Creatinine Clearance: 34.4 mL/min (A) (by C-G formula based on SCr of 1.21 mg/dL (H)). ? ?Liver Function Tests: ?Recent Labs  ?Lab 08/05/21 ?1029  ?AST 27  ?ALT 21  ?ALKPHOS 67  ?BILITOT 0.6  ?PROT 5.9*  ?ALBUMIN 3.2*  ? ? ?Recent Labs  ?Lab 08/05/21 ?1029  ?LIPASE 33  ? ? ? ? ?Radiology Studies: ?DG Chest Port 1 View ? ?Result Date: 08/05/2021 ?CLINICAL DATA:  Dysphagia EXAM: PORTABLE CHEST 1 VIEW COMPARISON:  Chest x-ray dated March 14, 2021 FINDINGS: Cardiac and mediastinal contours are within normal limits. Mild bibasilar opacities. No large pleural effusion or pneumothorax. IMPRESSION: Mild bibasilar opacities, likely atelectasis. Electronically Signed   By: Yetta Glassman M.D.   On: 08/05/2021 11:32   ? ? ? ? LOS: 0 days  ? ?Bonnielee Haff ? ?[facility] ?Pager on [email or web address] ? ?08/06/2021, 10:16 AM ? ? ?

## 2021-08-06 NOTE — Progress Notes (Signed)
[facility] Progress Note ? ?Chief Complaint: Dysphagia, odynophagia ? ?History: ? ?Betty Jackson has been able to tolerate a full liquid diet with a manageable amount of burning esophageal discomfort.  She remains on regular dosing of GI cocktail. ?She was just seen by the hospitalist because she is currently in rapid A-fib and has just received Cardizem bolus and drip. ?She has some ill-defined chest discomfort, says her breathing is at baseline and denies abdominal pain. ? ?Objective: ? ? ?Current Facility-Administered Medications:  ?  acetaminophen (TYLENOL) tablet 650 mg, 650 mg, Oral, Q4H PRN **OR** acetaminophen (TYLENOL) suppository 650 mg, 650 mg, Rectal, Q4H PRN, Shalhoub, Sherryll Burger, MD, 650 mg at 08/06/21 0502 ?  alum & mag hydroxide-simeth (MAALOX/MYLANTA) 200-200-20 MG/5ML suspension 30 mL, 30 mL, Oral, Q6H, 30 mL at 08/06/21 0448 **AND** [COMPLETED] lidocaine (XYLOCAINE) 2 % viscous mouth solution 15 mL, 15 mL, Oral, Once, [name], MD, 15 mL at 08/05/21 1806 ?  ampicillin-sulbactam (UNASYN) 1.5 g in sodium chloride 0.9 % 100 mL IVPB, 1.5 g, Intravenous, Q6H, Kamineni, Neelima, MD, Last Rate: 200 mL/hr at 08/06/21 0456, 1.5 g at 08/06/21 0456 ?  atorvastatin (LIPITOR) tablet 80 mg, 80 mg, Oral, Daily, Kamineni, Neelima, MD ?  diltiazem (CARDIZEM) 125 mg in dextrose 5% 125 mL (1 mg/mL) infusion, 5-15 mg/hr, Intravenous, Continuous, Bonnielee Haff, MD, Last Rate: 5 mL/hr at 08/06/21 0834, 5 mg/hr at 08/06/21 0834 ?  diltiazem (CARDIZEM) injection 5 mg, 5 mg, Intravenous, Q12H PRN, [name], MD, 5 mg at 08/06/21 0559 ?  feeding supplement (BOOST / RESOURCE BREEZE) liquid 1 Container, 1 Container, Oral, TID BM, [name], MD ?  [START ON 08/07/2021] fentaNYL (DURAGESIC) 12 MCG/HR 1 patch, 1 patch, Transdermal, Q72H, Kamineni, Neelima, MD ?  [START ON 08/07/2021] fentaNYL (DURAGESIC) 25 MCG/HR 1 patch, 1 patch, Transdermal, Q72H, Danis, Estill Cotta III, MD ?  hydrALAZINE (APRESOLINE) injection  10 mg, 10 mg, Intravenous, Q6H PRN, Danis, Estill Cotta III, MD ?  ipratropium-albuterol (DUONEB) 0.5-2.5 (3) MG/3ML nebulizer solution 3 mL, 3 mL, Nebulization, BID, Danis, Estill Cotta III, MD, 3 mL at 08/05/21 2013 ?  metoprolol tartrate (LOPRESSOR) tablet 100 mg, 100 mg, Oral, BID, Shalhoub, Sherryll Burger, MD ?  ondansetron (ZOFRAN) injection 4 mg, 4 mg, Intravenous, Q6H PRN, Danis, Estill Cotta III, MD ?  pantoprazole (PROTONIX) injection 40 mg, 40 mg, Intravenous, Q12H, [name], MD, 40 mg at 08/05/21 2204 ? ? ampicillin-sulbactam (UNASYN) IV 1.5 g (08/06/21 0456)  ? diltiazem (CARDIZEM) infusion 5 mg/hr (08/06/21 0834)  ?  ? ?Vital signs in last 24 hrs: ?Vitals:  ? 08/06/21 0651 08/06/21 0710  ?BP:  128/67  ?Pulse: 93 94  ?Resp: 18 17  ?Temp:  98 ?F (36.7 ?C)  ?SpO2: 92% 94%  ? ? ?Intake/Output Summary (Last 24 hours) at 08/06/2021 0903 ?Last data filed at 08/06/2021 9798 ?Gross per 24 hour  ?Intake 860 ml  ?Output 650 ml  ?Net 210 ml  ? ? ? ?Physical Exam ?She is not acutely ill-appearing. ?HEENT: sclera anicteric, oral mucosa without lesions ?Neck: supple, no thyromegaly, JVD or lymphadenopathy.  No crepitus in neck or chest ?Cardiac: Irregular and fast, no peripheral edema ?Pulm: clear to auscultation bilaterally, normal RR and fair effort noted ?Abdomen: soft, no tenderness, with active bowel sounds. No guarding or palpable hepatosplenomegaly ?Skin; warm and dry, no jaundice ? ?Recent Labs: ? ? ?  Latest Ref Rng & Units 08/06/2021  ?  3:47 AM 08/05/2021  ?  10:29 AM 03/17/2021  ?  2:32 AM  ?CBC  ?WBC 4.0 - 10.5 K/uL 12.4   14.9   8.3    ?Hemoglobin 12.0 - 15.0 g/dL 10.9   11.6   8.3    ?Hematocrit 36.0 - 46.0 % 35.5   37.9   27.1    ?Platelets 150 - 400 K/uL 147   214   171    ? ? ?No results for input(s): INR in the last 168 hours. ? ?  Latest Ref Rng & Units 08/06/2021  ?  3:47 AM 08/05/2021  ? 10:29 AM 03/19/2021  ?  3:40 AM  ?CMP  ?Glucose 70 - 99 mg/dL 96   103   88    ?BUN 8 - 23 mg/dL '28   22   10    '$ ?Creatinine 0.44  - 1.00 mg/dL 1.21   1.12   0.93    ?Sodium 135 - 145 mmol/L 142   141   136    ?Potassium 3.5 - 5.1 mmol/L 5.2   4.3   3.6    ?Chloride 98 - 111 mmol/L 110   106   105    ?CO2 22 - 32 mmol/L '24   30   27    '$ ?Calcium 8.9 - 10.3 mg/dL 8.1   8.6   8.0    ?Total Protein 6.5 - 8.1 g/dL  5.9     ?Total Bilirubin 0.3 - 1.2 mg/dL  0.6     ?Alkaline Phos 38 - 126 U/L  67     ?AST 15 - 41 U/L  27     ?ALT 0 - 44 U/L  21     ? ? ? ?Radiologic studies: ? ? ?Assessment & Plan  ?Assessment: ?Dysphagia/odynophagia ?Severe esophagitis found on EGD yesterday, most likely effect of medication (alendronate) with superimposed reflux.  This esophagitis was causing some blood loss as well. ?Hemoglobin mildly decreased from yesterday, renal function stable overall, currently in A-fib with RVR. ? ? ? ?Plan: ?Continue IV Protonix twice daily ?Full liquid diet today, and I have added liquid protein calorie supplements and encouraged her to take them to maintain nutrition. ? ?Tentative plans for EGD tomorrow if her A-fib is under rate control and she has not suffered an MI.  However, she does not appear well enough to undergo that, we will delay the procedure as long as possible, even putting it off to the outpatient setting if necessary. ?I will keep her n.p.o. after midnight just for the option of a procedure if she seems well enough for tomorrow. ? ?Updated daughters by phone ? ?[name] ?Office: [phone] ? ?

## 2021-08-06 NOTE — Progress Notes (Addendum)
See Dr. Elmarie Shiley note earlier today - this afternoon pt appeared more lethargic than she was earlier in the day so he reassessed her. HR was 120s-130s which he felt was an improvement as it was previously 150s-170s - he felt this was acceptable level as long as BP was OK. He recommended use of IV amiodarone only, no diltiazem or metoprolol, due to tendency for SBPs in the low 100s-1teens. We did alert internal medicine to her mental status changes and Dr. Acie Fredrickson raised question of infective cause. Dr. Maryland Pink did not feel this represented acute stroke, recommended ABG and CBG. This PM upon reassessment of VS, BP continues to trend down, last at 93/66. It does increase briefly during agitation (patient upset about bladder scan - has had decreasing UOP this afternoon). HR remains 120s-130s. Rapid involved with patient due to concern for lethargy - per preliminary discussion will order UA, lactic acid, procalcitonin, repeat CXR, BMET, CBC - Dr. Johney Frame plans to discuss recommendation of review/further medical workup with internal med team.  ?

## 2021-08-06 NOTE — Progress Notes (Signed)
?   08/06/21 0617  ?Vitals  ?Temp 98.1 ?F (36.7 ?C)  ?Temp Source Oral  ?BP 125/82 ?(Simultaneous filing. User may not have seen previous data.)  ?MAP (mmHg) 95 ?(Simultaneous filing. User may not have seen previous data.)  ?BP Location Right Arm  ?BP Method Automatic  ?Patient Position (if appropriate) Lying  ?Pulse Rate (!) 152 ?(Simultaneous filing. User may not have seen previous data.)  ?Pulse Rate Source Monitor  ?ECG Heart Rate (!) 158 ?(Simultaneous filing. User may not have seen previous data.)  ?Resp (!) 21 ?(Simultaneous filing. User may not have seen previous data.)  ?MEWS COLOR  ?MEWS Score Color Red  ?Oxygen Therapy  ?SpO2 94 % ?(Simultaneous filing. User may not have seen previous data.)  ?MEWS Score  ?MEWS Temp 0  ?MEWS Systolic 0  ?MEWS Pulse 3  ?MEWS RR 1  ?MEWS LOC 0  ?MEWS Score 4  ?Provider Notification  ?Provider Name/Title Shalhoub MD  ?Date Provider Notified 08/06/21  ?Time Provider Notified [phone]  ?Method of Notification Page  ?Notification Reason Other (Comment) ?([place])  ?Provider response See new orders  ?Date of Provider Response 08/06/21  ?Time of Provider Response [phone]  ? ? ?

## 2021-08-06 NOTE — Progress Notes (Signed)
Pt noted to be more lethargic this evening. Pt dozing off mid sentence but able to answer questions appropriate. VS stable last temp 98.4 bp 109/64 (77). Heart rate 138. 02 > 95 % on 2L [place]. BS 159. EKG performed in chart. Cards at bedside. Medical dr @ beside. Rapid RN notified. Charge notified. See new orders. Cardizem gtt holding. Will continue to monitor. Will continue POC.  ?

## 2021-08-06 NOTE — Consult Note (Addendum)
?Cardiology Consultation:  ? ?Patient ID: Betty Jackson ?MRN: [id number]; DOB: Aug 17, 1934 ? ?Admit date: 08/05/2021 ?Date of Consult: 08/06/2021 ? ?PCP:  Alvester Morin, MD ?  ?[facility] Providers ?Cardiologist:  [name], MD      ? ? ?Patient Profile:  ? ?Betty Jackson is a 87 y.o. female with a hx of PAF, PVCs, PACs, CAD s/p stent RCA, HTN, HLD, GERD, temporal arteritis, pericardial effusion s/p pericardial window, COPD who is being seen 08/06/2021 for the evaluation of atrial fib at the request of Dr. Maryland Pink. ? ?History of Present Illness:  ? ?Betty Jackson with above hx including stent to RCA in [place] in 2005, cardiac cath 2014 with patent RCA stent, and mild disease in LAD and LCX.   Neg stress myoview in 2016 and in 2019.  In 2019 with influenza A, PNA and sepsis she developed atrial fib.  She converted spontaneously to SR.    Echo February 2019 with normal LV size and function, grade 2 diastolic dysfunction, mild AI. She was not discharged on anticoagulation. She wore a cardiac monitor and had recurrent atrial fib. CHADS VASC score 4. Placed on eliquis.  In 02/2019 she had large pericardial effusion with subxiphoid window placed by Dr. Prescott Gum.   Last echo with possible sepsis in 2021 with normal LV systolic function and no significant valve disease.  Last seen 05/21/21 by Dr. Angelena Form without complaints, living in SNF.   She had multiple teeth removed 06/13/21 eliquis was held but had been resumed. Last dose 08/04/21 evening dose. ? ?Pt admitted 08/05/21 after presenting with difficulty swallowing, cough and on 2L [place] desats to 85%.  (She had been placed on 02 at facility 2 days prior to this episode for hypoxia)  Similar episode last year and needed esophageal dilatation.   She underwent EGD and found to have severe esophagitis with contact bleeding and adherent clot.  No biopsy due to fragility of mucosa and eliquis. ? ?CXR on admit with bibasilar infiltrates vs. Atelectasis. With on going low  grade temp. placed on ABX.  Pt had fever of 102.2 last pm.  ? ?EKG:  The EKG on 08/05/21 was personally reviewed and demonstrates:  SR with no acute ST changes, chronic poor R wave progression.  Today pt in atrial fib, EKG with a fib RVR at 149, ST depression in I, II, and lateral leads  ?Telemetry:  Telemetry was personally reviewed and demonstrates:  initially in SR then around 0400 today a fib with RVR.   ? ?Pt into a fib this AM 0435 and rec'd IV dilt 10 mg twice and on drip at 10 and lopressor 2.5 mg 4 times and one dose of 5 mg all IV. (Home dose of 100 mg BID)  BP to 16X systolic and has rec'd 450 cc NS.  She complains of chest pain, racing heart rate  ? ?Na 142, K+ 5.2, Cr 1.21 up from 1.12 on admit.  ?WBC on admit 14.9 now 12.4 Hgb 10.9 plts 147 ? ?BP 120/66 to 115/76 pt 145 afebrile today  ? ?Past Medical History:  ?Diagnosis Date  ? Acute respiratory failure with hypoxia ([place]) 05/20/2017  ? Anemia, unspecified 10/28/2012  ? Anxiety state, unspecified 10/28/2012  ? CAD (coronary artery disease)   ? a. Stent RCA in [facility];  b. Cath approx 2009 - nonobs per pt report.  ? Cataract   ? immature on the left eye  ? Chronic insomnia 02/07/2013  ? Chronic lower back  pain   ? scoliosis  ? CKD (chronic kidney disease) stage 59, GFR 30-59 ml/min (HCC)   ? CVA (cerebral infarction) 10/29/2012  ? DDD (degenerative disc disease)   ? Diverticulosis   ? GERD (gastroesophageal reflux disease) 09/01/2010  ? Hemorrhoids   ? Herniated nucleus pulposus, L5-S1, right 11/04/2015  ? Hyperlipidemia   ? takes Lipitor daily  ? Hypertension   ? takes Amlodipine,Losartan,Metoprolol,and HCTZ daily  ? Incontinence of urine   ? Insomnia   ? takes Restoril nightly  ? Lumbar stenosis 04/25/2013  ? Major depressive disorder, recurrent episode, moderate ([place]) 07/16/2013  ? Osteoporosis   ? PAF (paroxysmal atrial fibrillation) ([place]) 2011  ? a. lone epidode in 2011 according to notes.  ? Rheumatoid arthritis ([place])   ? Scoliosis   ?  Sepsis ([place]) 05/2019  ? Stroke [facility])   ? Temporal arteritis ([place]) 2011  ? a. followed @ [facility]; potential flareup 10/28/2012/notes 10/28/2012  ? Vocal cord dysfunction   ? "they don't operate properly" (10/28/2012)  ? ? ?Past Surgical History:  ?Procedure Laterality Date  ? ABDOMINAL HYSTERECTOMY  ~ 1984  ? vaginally  ? BACK SURGERY  7-59yr ago  ? X Stop  ? BLADDER SUSPENSION  2001  ? BREAST BIOPSY Right   ? CATARACT EXTRACTION W/ INTRAOCULAR LENS IMPLANT Right ~ 08/2012  ? CHEST TUBE INSERTION Left 03/08/2019  ? Procedure: Chest Tube Insertion;  Surgeon: VIvin Poot MD;  [facility]  Service: Thoracic;  Laterality: Left;  ? COLONOSCOPY  01/26/2012  ? Procedure: COLONOSCOPY;  Surgeon: MLadene Artist MD,FACG;  [facility]  Service: Endoscopy;  Laterality: N/A;  note the EGD is possible  ? CORONARY ANGIOPLASTY WITH STENT PLACEMENT  2006  ? X 1 stent  ? EPIDURAL BLOCK INJECTION    ? ESOPHAGOGASTRODUODENOSCOPY  01/26/2012  ? Procedure: ESOPHAGOGASTRODUODENOSCOPY (EGD);  Surgeon: MLadene Artist MD,FACG;  [facility];  Service: Endoscopy;  Laterality: N/A;  ? FINE NEEDLE ASPIRATION Right 09/26/2019  ? Procedure: FINE NEEDLE ASPIRATION;  Surgeon: HAltamese Ulysses MD;  [place] [facility]  Service: Orthopedics;  Laterality: Right;  ? HEMIARTHROPLASTY HIP Right 2012  ? IR EPIDUROGRAPHY  04/20/2019  ? LAPAROSCOPIC CHOLECYSTECTOMY  2001  ? LUMBAR FUSION  03/2013  ? LUMBAR LAMINECTOMY/DECOMPRESSION MICRODISCECTOMY Right 11/04/2015  ? Procedure: Right Lumbar Five-Sacral One Microdiskectomy;  Surgeon: HKristeen Miss MD;  [facility];  Service: Neurosurgery;  Laterality: Right;  Right L5-S1 Microdiskectomy  ? moles removed that required stiches    ? one on leg and one on face  ? ORIF FEMUR FRACTURE Right 09/26/2019  ? Procedure: OPEN REDUCTION INTERNAL FIXATION (ORIF) DISTAL FEMUR FRACTURE;  Surgeon: HAltamese Winona MD;  [place] [facility]  Service: Orthopedics;  Laterality: Right;  ? ORIF FEMUR FRACTURE  Right 10/17/2019  ? Procedure: OPEN REDUCTION INTERNAL FIXATION (ORIF) DISTAL FEMUR FRACTURE;  Surgeon: HAltamese Houghton MD;  [place] [facility]  Service: Orthopedics;  Laterality: Right;  ? SUBXYPHOID PERICARDIAL WINDOW N/A 03/08/2019  ? Procedure: SUBXYPHOID PERICARDIAL WINDOW;  Surgeon: VIvin Poot MD;  [place] [facility]  Service: Thoracic;  Laterality: N/A;  ? TEE WITHOUT CARDIOVERSION N/A 03/08/2019  ? Procedure: TRANSESOPHAGEAL ECHOCARDIOGRAM (TEE);  Surgeon: VPrescott Gum PCollier Salina MD;  [place] [facility]  Service: Thoracic;  Laterality: N/A;  ? TEMPORAL ARTERY BIOPSY / LIGATION Bilateral 2011  ? TONSILLECTOMY AND ADENOIDECTOMY    ? at age 87 ? TProvencal ~ 126 ? X-STOP IMPLANTATION  ~  2010  ? "lower back" (10/28/2012)  ?  ? ?Home Medications:  ?Prior to Admission medications   ?Medication Sig Start Date End Date Taking? Authorizing Provider  ?acetaminophen (TYLENOL) 500 MG tablet Take 500 mg by mouth every 6 (six) hours as needed.    [provider]  ?alendronate (FOSAMAX) 70 MG tablet Take 1 tablet (70 mg total) by mouth every 7 (seven) days. Take with a full glass of water on an empty stomach. ?Patient taking differently: Take 70 mg by mouth every Thursday. 06/14/20   Burchette, Alinda Sierras, MD  ?allopurinol (ZYLOPRIM) 100 MG tablet Take 100 mg by mouth daily.    [provider]  ?atorvastatin (LIPITOR) 80 MG tablet Take 1 tablet (80 mg total) by mouth daily. ?Patient taking differently: Take 80 mg by mouth at bedtime. 09/25/20   Garvin Fila, MD  ?cetirizine (ZYRTEC) 10 MG tablet Take 10 mg by mouth at bedtime.    [provider]  ?cholecalciferol (VITAMIN D) 25 MCG tablet Take 2 tablets (2,000 Units total) by mouth 2 (two) times daily. 09/29/19   Ainsley Spinner, PA-C  ?diltiazem (CARDIZEM CD) 240 MG 24 hr capsule Take 1 capsule (240 mg total) by mouth daily. 03/28/20   Burnell Blanks, MD  ?ELIQUIS 5 MG TABS tablet TAKE 1 TABLET BY MOUTH TWICE A DAY ?Patient taking differently:  Take 5 mg by mouth 2 (two) times daily. 05/09/20   Burnell Blanks, MD  ?escitalopram (LEXAPRO) 10 MG tablet TAKE 1 TABLET BY MOUTH EVERY DAY ?Patient taking differently: Take 10 mg by mouth daily. 5

## 2021-08-06 NOTE — Progress Notes (Addendum)
HOSPITAL MEDICINE OVERNIGHT EVENT NOTE   ? ?Nursing has notified me that patient is exhibiting bouts of tachycardia with heart rates ranging up to 150 bpm. ? ?Telemetry gives the appearance on occasion of SVT versus atrial fibrillation.  Twelve-lead EKG obtained with computer stating A-fib although most of it looks more like SVT. ? ?Admitting provider has already ordered as needed diltiazem for these episodes.  I advised nursing to ahead and administer it and reassess. ? ?Additionally, review of chemistry from this morning's labs reveals a potassium of 5.2.  We will administer a dose of Lokelma, discontinue Cozaar for now and continue to monitor on telemetry. ? ?Vernelle Emerald  MD ?[facility]  ? ? ? ? ? ? ? ? ? ? ?

## 2021-08-06 NOTE — Progress Notes (Signed)
?   08/06/21 0806  ?Assess: MEWS Score  ?Temp 98.2 ?F (36.8 ?C)  ?BP (!) 130/115  ?Pulse Rate (!) 150  ?ECG Heart Rate (!) 150  ?Resp 19  ?Level of Consciousness Alert  ?SpO2 97 %  ?O2 Device Nasal Cannula  ?O2 Flow Rate (L/min) 2 L/min  ?Assess: MEWS Score  ?MEWS Temp 0  ?MEWS Systolic 0  ?MEWS Pulse 3  ?MEWS RR 0  ?MEWS LOC 0  ?MEWS Score 3  ?MEWS Score Color Yellow  ?Assess: if the MEWS score is Yellow or Red  ?Were vital signs taken at a resting state? Yes  ?Focused Assessment No change from prior assessment  ?Early Detection of Sepsis Score *See Row Information* Low  ?MEWS guidelines implemented *See Row Information* Yes  ?Treat  ?MEWS Interventions Other (Comment) ?(MD notified)  ?Pain Scale 0-10  ?Pain Score 0  ?Take Vital Signs  ?Increase Vital Sign Frequency  Yellow: Q 2hr X 2 then Q 4hr X 2, if remains yellow, continue Q 4hrs  ?Escalate  ?MEWS: Escalate Yellow: discuss with charge nurse/RN and consider discussing with provider and RRT  ?Notify: Charge Nurse/RN  ?Name of Charge Nurse/RN Notified Beverlee Nims, RN  ?Date Charge Nurse/RN Notified 08/06/21  ?Time Charge Nurse/RN Notified [phone]  ?Notify: Provider  ?Provider Name/Title Gokul  ?Date Provider Notified 08/06/21  ?Time Provider Notified [phone]  ?Method of Notification Page  ?Notification Reason Change in status  ?Provider response At bedside  ?Date of Provider Response 08/06/21  ?Time of Provider Response [phone]  ?Document  ?Patient Outcome Not stable and remains on department  ? ? ?

## 2021-08-06 NOTE — Progress Notes (Signed)
Pt heart rate 170's sustaining. EKG performed. Pt asymptomatic. Bp 130/115 02 >90% on 2L [place]. MD notified. See new orders. Will continue to monitor. Will continue POC. ?

## 2021-08-06 NOTE — Anesthesia Postprocedure Evaluation (Signed)
Anesthesia Post Note ? ?Patient: Betty Jackson ? ?Procedure(s) Performed: ESOPHAGOGASTRODUODENOSCOPY (EGD) WITH PROPOFOL ? ?  ? ?Patient location during evaluation: PACU ?Anesthesia Type: General ?Level of consciousness: awake and alert ?Pain management: pain level controlled ?Vital Signs Assessment: post-procedure vital signs reviewed and stable ?Respiratory status: spontaneous breathing, nonlabored ventilation, respiratory function stable and patient connected to nasal cannula oxygen ?Cardiovascular status: blood pressure returned to baseline and stable ?Postop Assessment: no apparent nausea or vomiting ?Anesthetic complications: no ? ? ?No notable events documented. ? ?Last Vitals:  ?Vitals:  ? 08/06/21 0651 08/06/21 0710  ?BP:  128/67  ?Pulse: 93 94  ?Resp: 18 17  ?Temp:  36.7 ?C  ?SpO2: 92% 94%  ?  ?Last Pain:  ?Vitals:  ? 08/06/21 0710  ?TempSrc: Oral  ?PainSc:   ? ? ?  ?  ?  ?  ?  ?  ? ?[name] ? ? ? ? ?

## 2021-08-06 NOTE — Significant Event (Signed)
Rapid Response Event Note  ? ?Reason for Call :  ?Increased somnolence through out day.   ? ?Initial Focused Assessment:  ?Patient will react to stimuli but does not wake up and have conversation with staff.  After much stimulation she does open her eyes and ask for [place] to stop bothering her.   ?Lung sounds decreased in bases ?Heart tones irregular ? ? ?BP 93/66  AF 120-130s  RR 18  O2 sat 95%  Temp 98.6  ? ?Daughter at bedside ?Dr Johney Frame at bedside to assess patient ? ?Interventions:  ?Sepsis labs ?Bladder scan ?500cc NS bolus ? ?Plan of Care:  ? ? ?Event Summary:  ? ?MD Notified: Dr Johney Frame and Dr Marlowe Sax ?Call Time: ?Arrival Time: 8616 ?End Time: 1930 ? ?Raliegh Ip, RN ?

## 2021-08-07 ENCOUNTER — Encounter (HOSPITAL_COMMUNITY): Payer: Self-pay | Admitting: Gastroenterology

## 2021-08-07 DIAGNOSIS — J9621 Acute and chronic respiratory failure with hypoxia: Secondary | ICD-10-CM | POA: Diagnosis not present

## 2021-08-07 DIAGNOSIS — R1319 Other dysphagia: Secondary | ICD-10-CM | POA: Diagnosis not present

## 2021-08-07 DIAGNOSIS — K209 Esophagitis, unspecified without bleeding: Secondary | ICD-10-CM

## 2021-08-07 DIAGNOSIS — I4891 Unspecified atrial fibrillation: Secondary | ICD-10-CM | POA: Diagnosis not present

## 2021-08-07 DIAGNOSIS — R1310 Dysphagia, unspecified: Secondary | ICD-10-CM

## 2021-08-07 DIAGNOSIS — R778 Other specified abnormalities of plasma proteins: Secondary | ICD-10-CM

## 2021-08-07 DIAGNOSIS — I5032 Chronic diastolic (congestive) heart failure: Secondary | ICD-10-CM | POA: Diagnosis not present

## 2021-08-07 LAB — PROCALCITONIN
Procalcitonin: 0.27 ng/mL
Procalcitonin: 0.33 ng/mL

## 2021-08-07 LAB — BASIC METABOLIC PANEL
Anion gap: 9 (ref 5–15)
BUN: 20 mg/dL (ref 8–23)
CO2: 23 mmol/L (ref 22–32)
Calcium: 7.9 mg/dL — ABNORMAL LOW (ref 8.9–10.3)
Chloride: 113 mmol/L — ABNORMAL HIGH (ref 98–111)
Creatinine, Ser: 0.96 mg/dL (ref 0.44–1.00)
GFR, Estimated: 58 mL/min — ABNORMAL LOW (ref >60)
Glucose, Bld: 113 mg/dL — ABNORMAL HIGH (ref 70–99)
Potassium: 4.6 mmol/L (ref 3.5–5.1)
Sodium: 145 mmol/L (ref 135–145)

## 2021-08-07 MED ORDER — HYDROCORTISONE SOD SUC (PF) 100 MG IJ SOLR
50.0000 mg | Freq: Two times a day (BID) | INTRAMUSCULAR | Status: DC
  Administered 2021-08-07 – 2021-08-09 (×5): 50 mg via INTRAVENOUS
  Filled 2021-08-07 (×5): qty 2

## 2021-08-07 MED ORDER — ALUM & MAG HYDROXIDE-SIMETH 200-200-20 MG/5ML PO SUSP: 30.0000 mL | Freq: Four times a day (QID) | ORAL | Status: DC | PRN

## 2021-08-07 MED ORDER — SUCRALFATE 1 GM/10ML PO SUSP
1.0000 g | Freq: Three times a day (TID) | ORAL | Status: DC
  Administered 2021-08-07 – 2021-08-11 (×17): 1 g via ORAL
  Filled 2021-08-07 (×20): qty 10

## 2021-08-07 NOTE — Progress Notes (Signed)
Events of late yesterday and OVN noted. ? ?Patient not in condition for EGD today. ? ?We will see her today and make further recommendations, but I will write a diet order. ? ?- Wilfrid Lund, MD ?

## 2021-08-07 NOTE — Progress Notes (Addendum)
Patient ID: LOANNE EMERY, female   DOB: 1934/10/24, 87 y.o.   MRN: [id number] ? ? ? Progress Note ? ? Subjective  ? Day # 2 ?CC; dysphagia, odynophagia, severe esophagitis ? ?Patient says she feels okay this morning, she is not having any odynophagia, would like to stay on a liquid diet. ? ?Decision made earlier this morning not to proceed with repeat EGD at this time-new onset of atrial fibrillation with RVR yesterday setting of fever and elevated troponin which is felt secondary to demand ischemia ? ?IV Unasyn ?IV Protonix twice daily ? ? Objective  ? ?Vital signs in last 24 hours: ?Temp:  [98.1 ?F (36.7 ?C)-98.9 ?F (37.2 ?C)] 98.1 ?F (36.7 ?C) (05/11 0000) ?Pulse Rate:  [70-149] 85 (05/11 0800) ?Resp:  [13-20] 20 (05/11 0800) ?BP: (93-157)/(48-81) 157/53 (05/11 0800) ?SpO2:  [91 %-98 %] 94 % (05/11 0800) ?Weight:  [76.4 kg] 76.4 kg (05/11 0400) ?  ?General:    elderly WF in NAD, family at bedside  ?Heart:  irRegular rate and rhythm; no murmurs ?Lungs: Respirations even and unlabored, decreased breath sounds bilateral bases ?Abdomen:  Soft, nontender and nondistended. Normal bowel sounds. ?Extremities:  Without edema. ?Neurologic:  Alert and oriented,  grossly normal neurologically. ?Psych:  Cooperative. Normal mood and affect. ? ?Intake/Output from previous day: ?05/10 0701 - 05/11 0700 ?In: 2299.4 [I.V.:350.4; IV Piggyback:1949] ?Out: 950 [Urine:950] ?Intake/Output this shift: ?No intake/output data recorded. ? ?Lab Results: ?Recent Labs  ?  08/05/21 ?1029 08/06/21 ?3474 08/06/21 ?1952  ?WBC 14.9* 12.4* 12.6*  ?HGB 11.6* 10.9* 11.5*  ?HCT 37.9 35.5* 36.8  ?PLT 214 147* 193  ? ?BMET ?Recent Labs  ?  08/06/21 ?1419 08/06/21 ?1952 08/07/21 ?0219  ?NA 143 145 145  ?K 4.1 4.4 4.6  ?CL 109 109 113*  ?CO2 '25 24 23  '$ ?GLUCOSE 149* 151* 113*  ?BUN '23 22 20  '$ ?CREATININE 0.98 1.04* 0.96  ?CALCIUM 8.0* 8.0* 7.9*  ? ?LFT ?Recent Labs  ?  08/05/21 ?1029  ?PROT 5.9*  ?ALBUMIN 3.2*  ?AST 27  ?ALT 21  ?ALKPHOS 67  ?BILITOT 0.6   ? ?PT/INR ?No results for input(s): LABPROT, INR in the last 72 hours. ? ?Studies/Results: ?DG CHEST PORT 1 VIEW ? ?Result Date: 08/06/2021 ?CLINICAL DATA:  Lethargy EXAM: PORTABLE CHEST 1 VIEW COMPARISON:  08/05/2021, 03/14/2021 FINDINGS: Bandlike opacities at the left base likely atelectasis. No consolidation or effusion. Stable cardiomediastinal silhouette. No pneumothorax IMPRESSION: Bandlike opacities at the left base, increased compared to prior and likely reflecting atelectasis Electronically Signed   By: Donavan Foil M.D.   On: 08/06/2021 22:27  ? ?DG Chest Port 1 View ? ?Result Date: 08/05/2021 ?CLINICAL DATA:  Dysphagia EXAM: PORTABLE CHEST 1 VIEW COMPARISON:  Chest x-ray dated March 14, 2021 FINDINGS: Cardiac and mediastinal contours are within normal limits. Mild bibasilar opacities. No large pleural effusion or pneumothorax. IMPRESSION: Mild bibasilar opacities, likely atelectasis. Electronically Signed   By: Yetta Glassman M.D.   On: 08/05/2021 11:32   ? ? ? ? Assessment / Plan:   ?#57 87 year old white female with grade D acute severe esophagitis, found on EGD 08/05/2021 ?Most likely medication related/Fosamax probable superimposed reflux esophagitis. ? ?No evidence for active bleeding, hemoglobin stable ? ?Initial plan was for repeat EGD today to reassess to severity of inflammatory changes on initial exam. ? ?Patient has since had decline in status with new onset of A-fib with RVR, and fever and EGD is on hold ? ?Fortunately the dysphagia odynophagia have  improved. ? ?#2 A-fib with RVR ?#3 fever-possible pneumonia, on IV antibiotics/Unasyn, requiring O2 ? ?Plan; full liquid diet today ?Continue IV Protonix every 12 hours ?Change Maalox to as needed every 4 to 6 hours ?Carafate suspension 1 g between meals and at bedtime ? ?We will see how she does over the next few days and then determine timing of relook EGD ? ? ? ? ? ?Principal Problem: ?  Dysphagia ?Active Problems: ?  History of atrial  fibrillation ?  CAD (coronary artery disease) ?  Anemia, unspecified ?  Lumbar stenosis ?  Acute respiratory failure with hypoxia ([place]) ?  CKD (chronic kidney disease), stage III ([place]) ?  Chronic back pain ?  CHF (congestive heart failure) ([place]) ?  Chronic diastolic CHF (congestive heart failure) ([place]) ?  DNR (do not resuscitate) ?  Atrial fibrillation with RVR ([place]) ? ? ? ? LOS: 1 day  ? ?[name] PA-C 08/07/2021, 9:08 AM ? ?I have taken an interval history, thoroughly reviewed the chart and examined the patient. I agree with the Advanced Practitioner's note, impression and recommendations, and have recorded additional findings, impressions and recommendations below. ?I performed a substantive portion of this encounter (>50% time spent), including a complete performance of the medical decision making. ? ?My additional thoughts are as follows: ? ?She had a difficult day yesterday with rapid A-fib mental status change and fever with concern for sepsis.  Zosyn was started, and she converted to normal sinus rhythm today after treatment for the A-fib with Cardizem. ? ?She reports feeling better today, not having chest pain or dyspnea or abdominal pain.  She is reluctant to increase to a soft diet, but is thankfully not having much pain when swallowing. ? ?I have decided to hold off on a repeat upper endoscopy, and might not even put it off several weeks to the outpatient setting to give her further time to recover from her overall medical conditions and give the esophagitis more time to heal on medical therapy. ? ?She needs head of bed elevated at least 45 degrees at all times, twice daily oral PPI, we have added Carafate 3 times daily and the Magic mouthwash can be changed to as needed. ? ?I left an order to advance diet to soft as tolerated, when she feels ready to do so. ? ?We will check on her tomorrow. ? ?[name] ?Office:[phone] ? ?  ?

## 2021-08-07 NOTE — Progress Notes (Signed)
? ?[facility] ?PROGRESS NOTE ? ? ?Betty Jackson NLG:[id number] DOB: 06-21-1934 DOA: 08/05/2021 ? ?PCP: Alvester Morin, MD ? ?Brief History/Interval Summary: 87 y.o. female with history h/o CVA, paroxysmal atrial fibrillation on Eliquis, rheumatoid arthritis/giant cell arteritis on methotrexate/chronic prednisone, CKD stage IIIa, CAD, GERD, hyperlipidemia, depression/anxiety, osteoporosis, chronic back pain on fentanyl patch, chronic hypoxic respiratory failure on baseline 2 L O2 presented from SNF with complaints of dysphagia.  Patient has a history of dysphagia and has had esophageal dilatations by GI in the past.  However this episode appeared to be more of a sudden onset.  Patient was hospitalized for further management.    ? ? ?Consultants: Gastroenterology ? ?Procedures:   ? ?EGD 5/9 ?Impression:               - Severe esophagitis with bleeding. ?                          - Gastritis. ?                          - Normal examined duodenum. ?                          - No specimens collected. ?                          This is more severe reflux than is expected from  ?                          GERD, and medication-induced esophagitis is  ?                          suspected (alendronate). ? ? ? ? ?Subjective/Interval History: ?Patient noted to be awake and alert this morning.  Denies any chest pain shortness of breath.  Asking about something to eat and drink.   ? ? ? ?Assessment/Plan: ? ?Dysphagia ?EGD was done on 5/9 and shows severe esophagitis.  Evidence for gastritis also noted.  Medication induced esophagitis is suspected.  Patient was on alendronate prior to admission.  This has been held.   ?Plan is to repeat upper endoscopy once her cardiac status is more stable. ?Continue Protonix. ? ?Atrial fibrillation with RVR ?Patient with known history of paroxysmal atrial fibrillation.  Present.  GI complaints as discussed above.  Went into rapid atrial fibrillation on 5/10.  Despite diltiazem  infusion and parenteral doses of metoprolol patient's heart rate did not improve.  Cardiology was subsequently consulted and patient was started on amiodarone infusion ?Patient appears to have converted to sinus rhythm this morning. ?Cardiology is following ?Eliquis is on hold as patient will need repeat upper endoscopy with biopsies. ?Cardizem CD is on hold.  Metoprolol also placed on hold.  This was due to low blood pressures. ? ?Acute respiratory failure with hypoxia/aspiration pneumonia ?Chest x-ray showed bibasilar infiltrates versus atelectasis. ?WBC was noted to be elevated.  She was noted to be febrile. ?Fever curve appears to have improved.  We will recheck WBC tomorrow.  Procalcitonin 0.33.  Continue Unasyn for now.  Changed to oral once she is able to take medications by mouth. ? ?History of chronic diastolic CHF ?Diuretics on hold.  She was thought to be hypovolemic yesterday and required IV fluids.  Volume status seems to be stable this morning.  Continue to monitor. ?Last echocardiogram from 2021 showed normal systolic function. ? ?Chronic kidney disease stage IIIb/mild hyperkalemia ?Renal function close to baseline.  Lokelma was given for elevated potassium level.  Improved.   ?Monitor urine output. ? ?Chronic back pain ?Noted to be on fentanyl patch. ? ?Coronary artery disease ?Holding aspirin and anticoagulants.  Chest pain is most likely due to tachyarrhythmia.  Appears to have improved once heart rate got better controlled. ? ?History of rheumatoid arthritis/giant cell arteritis ?On prednisone chronically.  Since she is having difficulty swallowing tablets she was placed on hydrocortisone.  We will cut back on the dose. ?Also on methotrexate as an outpatient which is currently on hold. ? ?Osteoporosis ?Holding alendronate due to concern for medication induced esophagitis. ? ?DVT Prophylaxis: Currently on hold.  SCDs ?Code Status: DNR ?Family Communication: Discussed with patient.  No family at  bedside ?Disposition Plan: From skilled nursing facility ? ?Status is: Inpatient ?Remains inpatient appropriate because: Atrial fibrillation, dysphagia ? ? ? ? ? ?Medications: Scheduled: ? atorvastatin  80 mg Oral Daily  ? feeding supplement  1 Container Oral TID BM  ? fentaNYL  1 patch Transdermal Q72H  ? fentaNYL  1 patch Transdermal Q72H  ? hydrocortisone sod succinate (SOLU-CORTEF) inj  150 mg Intravenous Q12H  ? ipratropium-albuterol  3 mL Nebulization BID  ? pantoprazole (PROTONIX) IV  40 mg Intravenous Q12H  ? sucralfate  1 g Oral TID PC,HS,0200  ? ?Continuous: ? amiodarone 30 mg/hr (08/07/21 0454)  ? ampicillin-sulbactam (UNASYN) IV 1.5 g (08/07/21 0630)  ? ?[id number] **OR** acetaminophen, alum & mag hydroxide-simeth **AND** [COMPLETED] lidocaine, diltiazem, hydrALAZINE, nitroGLYCERIN, ondansetron (ZOFRAN) IV ? ?Antibiotics: ?Anti-infectives (From admission, onward)  ? ? Start     Dose/Rate Route Frequency Ordered Stop  ? 08/05/21 1830  ampicillin-sulbactam (UNASYN) 1.5 g in sodium chloride 0.9 % 100 mL IVPB       ? 1.5 g ?200 mL/hr over 30 Minutes Intravenous Every 6 hours 08/05/21 1757    ? ?  ? ? ?Objective: ? ?Vital Signs ? ?Vitals:  ? 08/07/21 0300 08/07/21 0400 08/07/21 0800 08/07/21 0917  ?BP: (!) 126/57 (!) 111/48 (!) 157/53   ?Pulse:  70 85   ?Resp: '16 17 20   '$ ?Temp:      ?TempSrc:      ?SpO2:  98% 94% 100%  ?Weight:  76.4 kg    ?Height:      ? ? ?Intake/Output Summary (Last 24 hours) at 08/07/2021 1048 ?Last data filed at 08/07/2021 0426 ?Gross per 24 hour  ?Intake 2299.36 ml  ?Output 800 ml  ?Net 1499.36 ml  ? ? ?Filed Weights  ? 08/05/21 1658 08/06/21 0000 08/07/21 0400  ?Weight: 77.6 kg 76.6 kg 76.4 kg  ? ? ?General appearance: Awake alert.  In no distress ?Resp: Normal effort at rest.  Few crackles at the bases bilaterally right more than left.  No wheezing or rhonchi. ?Cardio: S1-S2 is normal regular.  No S3-S4.  No rubs murmurs or bruit ?GI: Abdomen is soft.  Nontender nondistended.   Bowel sounds are present normal.  No masses organomegaly ?Extremities: No edema.  Full range of motion of lower extremities. ?Neurologic: No focal neurological deficits.  ? ? ? ?Lab Results: ? ?Data Reviewed: I have personally reviewed following labs and reports of the imaging studies ? ?CBC: ?Recent Labs  ?Lab 08/05/21 ?1029 08/06/21 ?2542 08/06/21 ?1952  ?WBC 14.9* 12.4* 12.6*  ?HGB 11.6* 10.9*  11.5*  ?HCT 37.9 35.5* 36.8  ?MCV 98.4 97.8 97.4  ?PLT 214 147* 193  ? ? ? ?Basic Metabolic Panel: ?Recent Labs  ?Lab 08/05/21 ?1029 08/06/21 ?5361 08/06/21 ?1419 08/06/21 ?1952 08/07/21 ?0219  ?NA 141 142 143 145 145  ?K 4.3 5.2* 4.1 4.4 4.6  ?CL 106 110 109 109 113*  ?CO2 '30 24 25 24 23  '$ ?GLUCOSE 103* 96 149* 151* 113*  ?BUN 22 28* '23 22 20  '$ ?CREATININE 1.12* 1.21* 0.98 1.04* 0.96  ?CALCIUM 8.6* 8.1* 8.0* 8.0* 7.9*  ? ? ? ?GFR: ?Estimated Creatinine Clearance: 43.3 mL/min (by C-G formula based on SCr of 0.96 mg/dL). ? ?Liver Function Tests: ?Recent Labs  ?Lab 08/05/21 ?1029  ?AST 27  ?ALT 21  ?ALKPHOS 67  ?BILITOT 0.6  ?PROT 5.9*  ?ALBUMIN 3.2*  ? ? ? ?Recent Labs  ?Lab 08/05/21 ?1029  ?LIPASE 33  ? ? ? ? ? ?Radiology Studies: ?DG CHEST PORT 1 VIEW ? ?Result Date: 08/06/2021 ?CLINICAL DATA:  Lethargy EXAM: PORTABLE CHEST 1 VIEW COMPARISON:  08/05/2021, 03/14/2021 FINDINGS: Bandlike opacities at the left base likely atelectasis. No consolidation or effusion. Stable cardiomediastinal silhouette. No pneumothorax IMPRESSION: Bandlike opacities at the left base, increased compared to prior and likely reflecting atelectasis Electronically Signed   By: Donavan Foil M.D.   On: 08/06/2021 22:27  ? ?DG Chest Port 1 View ? ?Result Date: 08/05/2021 ?CLINICAL DATA:  Dysphagia EXAM: PORTABLE CHEST 1 VIEW COMPARISON:  Chest x-ray dated March 14, 2021 FINDINGS: Cardiac and mediastinal contours are within normal limits. Mild bibasilar opacities. No large pleural effusion or pneumothorax. IMPRESSION: Mild bibasilar opacities, likely  atelectasis. Electronically Signed   By: Yetta Glassman M.D.   On: 08/05/2021 11:32   ? ? ? ? LOS: 1 day  ? ?Bonnielee Haff ? ?[facility] ?Pager on [email or web address] ? ?08/07/2021, 10:48 AM ? ? ?

## 2021-08-07 NOTE — Progress Notes (Signed)
Pt HR in the 70's and NSR on the monitor. EKG completed confirms NSR. MD on-call Terri Skains paged and notified. No new orders received yet. Will continue to closely monitor pt. Pt sleeping comfortably in bed with call light within reach and private sitter at bedside. Delia Heady RN ? ? 08/07/21 0200  ?Vitals  ?BP (!) 106/53  ?MAP (mmHg) 70  ?Pulse Rate 77  ?ECG Heart Rate 78  ?Resp 16  ?MEWS COLOR  ?MEWS Score Color Green  ?Oxygen Therapy  ?SpO2 96 %  ?MEWS Score  ?MEWS Temp 0  ?MEWS Systolic 0  ?MEWS Pulse 0  ?MEWS RR 0  ?MEWS LOC 0  ?MEWS Score 0  ? ? ?

## 2021-08-07 NOTE — Progress Notes (Signed)
? ?Progress Note ? ?Patient Name: Betty Jackson ?Date of Encounter: 08/07/2021 ? ?[facility] Cardiologist: [name], MD  ? ?Subjective  ? ?She feels much better today. She is awake and alert. No complaints.  ? ?Inpatient Medications  ?  ?Scheduled Meds: ? alum & mag hydroxide-simeth  30 mL Oral Q6H  ? atorvastatin  80 mg Oral Daily  ? feeding supplement  1 Container Oral TID BM  ? fentaNYL  1 patch Transdermal Q72H  ? fentaNYL  1 patch Transdermal Q72H  ? hydrocortisone sod succinate (SOLU-CORTEF) inj  150 mg Intravenous Q12H  ? ipratropium-albuterol  3 mL Nebulization BID  ? pantoprazole (PROTONIX) IV  40 mg Intravenous Q12H  ? ?Continuous Infusions: ? amiodarone 30 mg/hr (08/07/21 0454)  ? ampicillin-sulbactam (UNASYN) IV 1.5 g (08/07/21 8185)  ? ?PRN Meds: ?acetaminophen **OR** acetaminophen, diltiazem, hydrALAZINE, nitroGLYCERIN, ondansetron (ZOFRAN) IV  ? ?Vital Signs  ?  ?Vitals:  ? 08/07/21 0200 08/07/21 0300 08/07/21 0400 08/07/21 0800  ?BP: (!) 106/53 (!) 126/57 (!) 111/48 (!) 157/53  ?Pulse: 77  70 85  ?Resp: '16 16 17 20  '$ ?Temp:      ?TempSrc:      ?SpO2: 96%  98% 94%  ?Weight:   76.4 kg   ?Height:      ? ? ?Intake/Output Summary (Last 24 hours) at 08/07/2021 0907 ?Last data filed at 08/07/2021 0426 ?Gross per 24 hour  ?Intake 2299.36 ml  ?Output 950 ml  ?Net 1349.36 ml  ? ? ?  08/07/2021  ?  4:00 AM 08/06/2021  ? 12:00 AM 08/05/2021  ?  4:58 PM  ?Last 3 Weights  ?Weight (lbs) 168 lb 6.9 oz 168 lb 14 oz 171 lb 1.2 oz  ?Weight (kg) 76.4 kg 76.6 kg 77.6 kg  ?   ? ?Telemetry  ?  ?sinus - Personally Reviewed ? ?ECG  ?  ?Sinus, non-specific T wave abn - Personally Reviewed ? ?Physical Exam  ? ?GEN: No acute distress.   ?Neck: No JVD ?Cardiac: RRR, no murmurs, rubs, or gallops.  ?Respiratory: Clear to auscultation bilaterally. ?GI: Soft, nontender, non-distended  ?MS: No edema; No deformity. ?Neuro:  Nonfocal  ?Psych: Normal affect  ? ?Labs  ?  ?High Sensitivity Troponin:   ?Recent Labs  ?Lab  08/06/21 ?1419 08/06/21 ?1600  ?TROPONINIHS 68* 78*  ?   ?Chemistry ?Recent Labs  ?Lab 08/05/21 ?1029 08/06/21 ?6314 08/06/21 ?1419 08/06/21 ?1952 08/07/21 ?0219  ?NA 141   < > 143 145 145  ?K 4.3   < > 4.1 4.4 4.6  ?CL 106   < > 109 109 113*  ?CO2 30   < > '25 24 23  '$ ?GLUCOSE 103*   < > 149* 151* 113*  ?BUN 22   < > '23 22 20  '$ ?CREATININE 1.12*   < > 0.98 1.04* 0.96  ?CALCIUM 8.6*   < > 8.0* 8.0* 7.9*  ?PROT 5.9*  --   --   --   --   ?ALBUMIN 3.2*  --   --   --   --   ?AST 27  --   --   --   --   ?ALT 21  --   --   --   --   ?ALKPHOS 67  --   --   --   --   ?BILITOT 0.6  --   --   --   --   ?GFRNONAA 48*   < > 57* 53* 58*  ?  ANIONGAP 5   < > '9 12 9  '$ ? < > = values in this interval not displayed.  ?  ?Lipids No results for input(s): CHOL, TRIG, HDL, LABVLDL, LDLCALC, CHOLHDL in the last 168 hours.  ?Hematology ?Recent Labs  ?Lab 08/05/21 ?1029 08/06/21 ?5916 08/06/21 ?1952  ?WBC 14.9* 12.4* 12.6*  ?RBC 3.85* 3.63* 3.78*  ?HGB 11.6* 10.9* 11.5*  ?HCT 37.9 35.5* 36.8  ?MCV 98.4 97.8 97.4  ?MCH 30.1 30.0 30.4  ?MCHC 30.6 30.7 31.3  ?RDW 15.0 15.2 14.9  ?PLT 214 147* 193  ? ?Thyroid  ?Recent Labs  ?Lab 08/06/21 ?1419  ?TSH 0.801  ?  ?BNPNo results for input(s): BNP, PROBNP in the last 168 hours.  ?DDimer No results for input(s): DDIMER in the last 168 hours.  ? ?Radiology  ?  ?DG CHEST PORT 1 VIEW ? ?Result Date: 08/06/2021 ?CLINICAL DATA:  Lethargy EXAM: PORTABLE CHEST 1 VIEW COMPARISON:  08/05/2021, 03/14/2021 FINDINGS: Bandlike opacities at the left base likely atelectasis. No consolidation or effusion. Stable cardiomediastinal silhouette. No pneumothorax IMPRESSION: Bandlike opacities at the left base, increased compared to prior and likely reflecting atelectasis Electronically Signed   By: Donavan Foil M.D.   On: 08/06/2021 22:27  ? ?DG Chest Port 1 View ? ?Result Date: 08/05/2021 ?CLINICAL DATA:  Dysphagia EXAM: PORTABLE CHEST 1 VIEW COMPARISON:  Chest x-ray dated March 14, 2021 FINDINGS: Cardiac and mediastinal  contours are within normal limits. Mild bibasilar opacities. No large pleural effusion or pneumothorax. IMPRESSION: Mild bibasilar opacities, likely atelectasis. Electronically Signed   By: Yetta Glassman M.D.   On: 08/05/2021 11:32   ? ?Cardiac Studies  ? ? ? ?Patient Profile  ?   ?87 y.o. female with history of PAF, PVCs, CAD, HTN, HLD and COPD admitted 08/05/21 with difficulty swallowing, cough and on 2L [place] desats to 85%.  (She had been placed on 02 at facility 2 days prior to this episode for hypoxia)  Similar episode last year and needed esophageal dilatation.   She underwent EGD and found to have severe esophagitis with contact bleeding and adherent clot.  No biopsy due to fragility of mucosa and eliquis. Chest x-ray with bibasilar infiltrates. She was also febrile and started on antibiotics.  ? ?Assessment & Plan  ?  ?Atrial fib with RVR: She had RVR in setting of acute febrile illness. She converted to sinus on amiodarone. I would continue amiodarone IV today to get a good loading dose.  ?Troponin elevation: Likely due to demand ischemia in setting of fever, atrial fib with RVR. No chest pain today in sinus. No ischemic evaluation is planned.  ? ?For questions or updates, please contact [place] ?Please consult [email or web address] for contact info under  ? ?  ?   ?Signed, ?[name], MD  ?08/07/2021, 9:07 AM   ? ?

## 2021-08-08 DIAGNOSIS — R1319 Other dysphagia: Secondary | ICD-10-CM | POA: Diagnosis not present

## 2021-08-08 DIAGNOSIS — J9621 Acute and chronic respiratory failure with hypoxia: Secondary | ICD-10-CM | POA: Diagnosis not present

## 2021-08-08 DIAGNOSIS — I4891 Unspecified atrial fibrillation: Secondary | ICD-10-CM | POA: Diagnosis not present

## 2021-08-08 DIAGNOSIS — I5032 Chronic diastolic (congestive) heart failure: Secondary | ICD-10-CM | POA: Diagnosis not present

## 2021-08-08 LAB — CBC
HCT: 28.8 % — ABNORMAL LOW (ref 36.0–46.0)
Hemoglobin: 8.8 g/dL — ABNORMAL LOW (ref 12.0–15.0)
MCH: 29.2 pg (ref 26.0–34.0)
MCHC: 30.6 g/dL (ref 30.0–36.0)
MCV: 95.7 fL (ref 80.0–100.0)
Platelets: 185 10*3/uL (ref 150–400)
RBC: 3.01 MIL/uL — ABNORMAL LOW (ref 3.87–5.11)
RDW: 14.9 % (ref 11.5–15.5)
WBC: 9.3 10*3/uL (ref 4.0–10.5)
nRBC: 0 % (ref 0.0–0.2)

## 2021-08-08 LAB — HEMOGLOBIN AND HEMATOCRIT, BLOOD
HCT: 31.9 % — ABNORMAL LOW (ref 36.0–46.0)
HCT: 29.5 % — ABNORMAL LOW (ref 36.0–46.0)
Hemoglobin: 9.8 g/dL — ABNORMAL LOW (ref 12.0–15.0)
Hemoglobin: 9.2 g/dL — ABNORMAL LOW (ref 12.0–15.0)

## 2021-08-08 LAB — BASIC METABOLIC PANEL
Anion gap: 9 (ref 5–15)
BUN: 23 mg/dL (ref 8–23)
CO2: 26 mmol/L (ref 22–32)
Calcium: 7.7 mg/dL — ABNORMAL LOW (ref 8.9–10.3)
Chloride: 106 mmol/L (ref 98–111)
Creatinine, Ser: 1.12 mg/dL — ABNORMAL HIGH (ref 0.44–1.00)
GFR, Estimated: 48 mL/min — ABNORMAL LOW (ref >60)
Glucose, Bld: 111 mg/dL — ABNORMAL HIGH (ref 70–99)
Potassium: 3.6 mmol/L (ref 3.5–5.1)
Sodium: 141 mmol/L (ref 135–145)

## 2021-08-08 LAB — PROCALCITONIN: Procalcitonin: 0.23 ng/mL

## 2021-08-08 MED ORDER — AMIODARONE HCL IN DEXTROSE 360-4.14 MG/200ML-% IV SOLN
30.0000 mg/h | INTRAVENOUS | Status: DC
Start: 2021-08-08 — End: 2021-08-09
  Administered 2021-08-08: 30 mg/h via INTRAVENOUS
  Filled 2021-08-08: qty 200

## 2021-08-08 MED ORDER — AMIODARONE HCL IN DEXTROSE 360-4.14 MG/200ML-% IV SOLN
30.0000 mg/h | INTRAVENOUS | Status: DC
  Administered 2021-08-08: 30 mg/h via INTRAVENOUS

## 2021-08-08 MED ORDER — METOPROLOL TARTRATE 5 MG/5ML IV SOLN: 5.0000 mg | Freq: Four times a day (QID) | INTRAVENOUS | Status: DC | PRN

## 2021-08-08 MED ORDER — SACCHAROMYCES BOULARDII 250 MG PO CAPS
250.0000 mg | ORAL_CAPSULE | Freq: Two times a day (BID) | ORAL | Status: DC
  Administered 2021-08-08 – 2021-08-11 (×7): 250 mg via ORAL
  Filled 2021-08-08 (×7): qty 1

## 2021-08-08 MED ORDER — AMIODARONE HCL 200 MG PO TABS: 200.0000 mg | ORAL_TABLET | Freq: Two times a day (BID) | ORAL | Status: DC

## 2021-08-08 MED ORDER — MELATONIN 5 MG PO TABS: 5.0000 mg | ORAL_TABLET | Freq: Every evening | ORAL | Status: DC | PRN

## 2021-08-08 MED ORDER — METOPROLOL TARTRATE 50 MG PO TABS
50.0000 mg | ORAL_TABLET | Freq: Two times a day (BID) | ORAL | Status: DC
  Administered 2021-08-08 (×2): 50 mg via ORAL
  Filled 2021-08-08 (×2): qty 1

## 2021-08-08 MED ORDER — POTASSIUM CHLORIDE 20 MEQ PO PACK
40.0000 meq | PACK | Freq: Once | ORAL | Status: AC
Start: 2021-08-08 — End: 2021-08-08
  Administered 2021-08-08: 40 meq via ORAL
  Filled 2021-08-08: qty 2

## 2021-08-08 MED ORDER — METOPROLOL TARTRATE 100 MG PO TABS: 100.0000 mg | ORAL_TABLET | Freq: Two times a day (BID) | ORAL | Status: DC

## 2021-08-08 NOTE — Progress Notes (Addendum)
? ?Progress Note ? ?Patient Name: Betty Jackson ?Date of Encounter: 08/08/2021 ? ?[facility] Cardiologist: [name], MD  ? ?Subjective  ? ?Feeling better. NO complaints today ? ?Inpatient Medications  ?  ?Scheduled Meds: ? atorvastatin  80 mg Oral Daily  ? feeding supplement  1 Container Oral TID BM  ? fentaNYL  1 patch Transdermal Q72H  ? fentaNYL  1 patch Transdermal Q72H  ? hydrocortisone sod succinate (SOLU-CORTEF) inj  50 mg Intravenous Q12H  ? ipratropium-albuterol  3 mL Nebulization BID  ? pantoprazole (PROTONIX) IV  40 mg Intravenous Q12H  ? sucralfate  1 g Oral TID PC,HS,0200  ? ?Continuous Infusions: ? amiodarone 30 mg/hr (08/08/21 0601)  ? ampicillin-sulbactam (UNASYN) IV Stopped (08/08/21 0557)  ? ?PRN Meds: ?acetaminophen **OR** acetaminophen, alum & mag hydroxide-simeth **AND** [COMPLETED] lidocaine, diltiazem, hydrALAZINE, nitroGLYCERIN, ondansetron (ZOFRAN) IV  ? ?Vital Signs  ?  ?Vitals:  ? 08/08/21 0400 08/08/21 0600 08/08/21 0800 08/08/21 0817  ?BP: (!) 165/66 (!) 155/57 (!) 158/55   ?Pulse: 67 62 (!) 57   ?Resp: '17 16 17   '$ ?Temp: 98.9 ?F (37.2 ?C)     ?TempSrc: Oral     ?SpO2: 97% 100%  100%  ?Weight: 78.6 kg     ?Height:      ? ? ?Intake/Output Summary (Last 24 hours) at 08/08/2021 0843 ?Last data filed at 08/08/2021 0601 ?Gross per 24 hour  ?Intake 1677.58 ml  ?Output 300 ml  ?Net 1377.58 ml  ? ? ?  08/08/2021  ?  4:00 AM 08/07/2021  ?  4:00 AM 08/06/2021  ? 12:00 AM  ?Last 3 Weights  ?Weight (lbs) 173 lb 4.5 oz 168 lb 6.9 oz 168 lb 14 oz  ?Weight (kg) 78.6 kg 76.4 kg 76.6 kg  ?   ? ?Telemetry  ?  ?Sinus with PACs - Personally Reviewed ? ?ECG  ?  ?No AM EKG ? ?Physical Exam  ? ?General: Well developed, well nourished, NAD  ?HEENT: OP clear, mucus membranes moist  ?SKIN: warm, dry. No rashes. ?Neuro: No focal deficits  ?Musculoskeletal: Muscle strength 5/5 all ext  ?Psychiatric: Mood and affect normal  ?Neck: No JVD ?Lungs:Clear bilaterally, no wheezes, rhonci,  crackles ?Cardiovascular: Regular rate and rhythm. No murmurs, gallops or rubs. ?Abdomen:Soft. Bowel sounds present. Non-tender.  ?Extremities: No lower extremity edema. ? ?Labs  ?  ?High Sensitivity Troponin:   ?Recent Labs  ?Lab 08/06/21 ?1419 08/06/21 ?1600  ?TROPONINIHS 68* 78*  ?   ?Chemistry ?Recent Labs  ?Lab 08/05/21 ?1029 08/06/21 ?8341 08/06/21 ?1952 08/07/21 ?9622 08/08/21 ?2979  ?NA 141   < > 145 145 141  ?K 4.3   < > 4.4 4.6 3.6  ?CL 106   < > 109 113* 106  ?CO2 30   < > '24 23 26  '$ ?GLUCOSE 103*   < > 151* 113* 111*  ?BUN 22   < > '22 20 23  '$ ?CREATININE 1.12*   < > 1.04* 0.96 1.12*  ?CALCIUM 8.6*   < > 8.0* 7.9* 7.7*  ?PROT 5.9*  --   --   --   --   ?ALBUMIN 3.2*  --   --   --   --   ?AST 27  --   --   --   --   ?ALT 21  --   --   --   --   ?ALKPHOS 67  --   --   --   --   ?BILITOT 0.6  --   --   --   --   ?  GFRNONAA 48*   < > 53* 58* 48*  ?ANIONGAP 5   < > '12 9 9  '$ ? < > = values in this interval not displayed.  ?  ?Lipids No results for input(s): CHOL, TRIG, HDL, LABVLDL, LDLCALC, CHOLHDL in the last 168 hours.  ?Hematology ?Recent Labs  ?Lab 08/06/21 ?8841 08/06/21 ?1952 08/08/21 ?6606  ?WBC 12.4* 12.6* 9.3  ?RBC 3.63* 3.78* 3.01*  ?HGB 10.9* 11.5* 8.8*  ?HCT 35.5* 36.8 28.8*  ?MCV 97.8 97.4 95.7  ?MCH 30.0 30.4 29.2  ?MCHC 30.7 31.3 30.6  ?RDW 15.2 14.9 14.9  ?PLT 147* 193 185  ? ?Thyroid  ?Recent Labs  ?Lab 08/06/21 ?1419  ?TSH 0.801  ?  ?BNPNo results for input(s): BNP, PROBNP in the last 168 hours.  ?DDimer No results for input(s): DDIMER in the last 168 hours.  ? ?Radiology  ?  ?DG CHEST PORT 1 VIEW ? ?Result Date: 08/06/2021 ?CLINICAL DATA:  Lethargy EXAM: PORTABLE CHEST 1 VIEW COMPARISON:  08/05/2021, 03/14/2021 FINDINGS: Bandlike opacities at the left base likely atelectasis. No consolidation or effusion. Stable cardiomediastinal silhouette. No pneumothorax IMPRESSION: Bandlike opacities at the left base, increased compared to prior and likely reflecting atelectasis Electronically Signed   By: Donavan Foil M.D.   On: 08/06/2021 22:27   ? ?Cardiac Studies  ? ? ? ?Patient Profile  ?   ?87 y.o. female with history of PAF, PVCs, CAD, HTN, HLD and COPD admitted 08/05/21 with difficulty swallowing, cough and on 2L [place] desats to 85%.  (She had been placed on 02 at facility 2 days prior to this episode for hypoxia)  Similar episode last year and needed esophageal dilatation.   She underwent EGD and found to have severe esophagitis with contact bleeding and adherent clot.  No biopsy due to fragility of mucosa and eliquis. Chest x-ray with bibasilar infiltrates. She was also febrile and started on antibiotics.  ? ?Assessment & Plan  ?  ?Atrial fib with RVR: She had RVR in setting of acute febrile illness. She converted to sinus on amiodarone on 08/07/21. She was in sinus today when I saw her at 9am but converted to atrial fib with RVR at 9:10am. Will continue IV amiodarone today. Will start metoprolol 50 mg po BID (home dose 100 mg po BID).   ?  ?Troponin elevation: Likely due to demand ischemia in setting of fever, atrial fib with RVR. No chest pain today in sinus. No ischemic evaluation is planned.  ? ?For questions or updates, please contact [place] ?Please consult [email or web address] for contact info under  ?   ?Signed, ?[name], MD  ?08/08/2021, 8:43 AM   ? ?

## 2021-08-08 NOTE — Progress Notes (Addendum)
Patient ID: Betty Jackson, female   DOB: Nov 17, 1934, 87 y.o.   MRN: [id number] ? ? ? Progress Note ? ? Subjective  ? Day # 3 ? CC;, dysphagia/odynophagia, severe esophagitis ? ?IV Protonix twice daily ?IV Unasyn ? ?Unfortunately back in A-fib with RVR this morning ?She says she feels okay, not currently having any difficulty with dysphagia and ate solid food for breakfast. ?No abdominal pain ? ? Objective  ? ?Vital signs in last 24 hours: ?Temp:  [98.1 ?F (36.7 ?C)-98.9 ?F (37.2 ?C)] 98.9 ?F (37.2 ?C) (05/12 0400) ?Pulse Rate:  [57-91] 57 (05/12 0800) ?Resp:  [15-20] 17 (05/12 0800) ?BP: (108-165)/(50-88) 158/55 (05/12 0800) ?SpO2:  [90 %-100 %] 100 % (05/12 0817) ?Weight:  [78.6 kg] 78.6 kg (05/12 0400) ?  ?General: Elderly white female in NAD ?Heart:  irRegular rate and rhythm; no murmurs, tachy ?Lungs: Respirations even and unlabored, lungs CTA bilaterally ?Abdomen:  Soft, nontender and nondistended. Normal bowel sounds. ?Extremities:  Without edema. ?Neurologic:  Alert and oriented,  grossly normal neurologically. ?Psych:  Cooperative. Normal mood and affect. ? ?Intake/Output from previous day: ?05/11 0701 - 05/12 0700 ?In: 1677.6 [P.O.:450; I.V.:419.6; IV Piggyback:808] ?Out: 300 [Urine:300] ?Intake/Output this shift: ?No intake/output data recorded. ? ?Lab Results: ?Recent Labs  ?  08/06/21 ?0160 08/06/21 ?1952 08/08/21 ?1093  ?WBC 12.4* 12.6* 9.3  ?HGB 10.9* 11.5* 8.8*  ?HCT 35.5* 36.8 28.8*  ?PLT 147* 193 185  ? ?BMET ?Recent Labs  ?  08/06/21 ?1952 08/07/21 ?0219 08/08/21 ?0412  ?NA 145 145 141  ?K 4.4 4.6 3.6  ?CL 109 113* 106  ?CO2 '24 23 26  '$ ?GLUCOSE 151* 113* 111*  ?BUN '22 20 23  '$ ?CREATININE 1.04* 0.96 1.12*  ?CALCIUM 8.0* 7.9* 7.7*  ? ?LFT ?Recent Labs  ?  08/05/21 ?1029  ?PROT 5.9*  ?ALBUMIN 3.2*  ?AST 27  ?ALT 21  ?ALKPHOS 67  ?BILITOT 0.6  ? ?PT/INR ?No results for input(s): LABPROT, INR in the last 72 hours. ? ?Studies/Results: ?DG CHEST PORT 1 VIEW ? ?Result Date: 08/06/2021 ?CLINICAL DATA:  Lethargy  EXAM: PORTABLE CHEST 1 VIEW COMPARISON:  08/05/2021, 03/14/2021 FINDINGS: Bandlike opacities at the left base likely atelectasis. No consolidation or effusion. Stable cardiomediastinal silhouette. No pneumothorax IMPRESSION: Bandlike opacities at the left base, increased compared to prior and likely reflecting atelectasis Electronically Signed   By: Donavan Foil M.D.   On: 08/06/2021 22:27   ? ? ? ? Assessment / Plan:   ? ?#74 87 year old white female with grade D acute severe esophagitis, found on EGD 08/05/2021 ?Felt most likely related to medications/Fosamax probably superimposed on reflux esophagitis ?Biopsies pending ? ?Symptomatically she is much better and having no current complaints of dysphagia or odynophagia, and tolerating solid food ? ?Continues on IV Protonix twice daily ? ?#2 anemia-patient has had a drop in hemoglobin since yesterday 11.5>8.8 ?No evidence for overt bleeding ? ?#3 new onset A-fib with RVR-on Eliquis, on hold ?#4 rheumatoid arthritis/giant cell arteritis ?#5 chronic respiratory failure on baseline O2 2 L-now with acute component of respiratory failure possible aspiration pneumonia ? ?#6 chronic kidney disease stage III ?#7 coronary artery disease ? ?Plan; no plans for repeat EGD acutely-patient essentially has had significant improvement in symptoms ?Would continue on IV PPI for few more days then transition to p.o. twice daily ? ?We will need to watch hemoglobin closely, transfuse for hemoglobin 7.5 or less ? ? ? ?Principal Problem: ?  Dysphagia ?Active Problems: ?  History of atrial fibrillation ?  CAD (coronary artery disease) ?  Anemia, unspecified ?  Lumbar stenosis ?  Acute respiratory failure with hypoxia ([place]) ?  CKD (chronic kidney disease), stage III ([place]) ?  Chronic back pain ?  CHF (congestive heart failure) ([place]) ?  Chronic diastolic CHF (congestive heart failure) ([place]) ?  DNR (do not resuscitate) ?  Atrial fibrillation with RVR ([place]) ? ? ? ? LOS: 2 days  ? ?Betty Esterwood  PA-C 08/08/2021, 10:13 AM ? ?I have taken an interval history, thoroughly reviewed the chart and examined the patient. I agree with the Advanced Practitioner's note, impression and recommendations, and have recorded additional findings, impressions and recommendations below. ?I performed a substantive portion of this encounter (>50% time spent), including a complete performance of the medical decision making. ? ?My additional thoughts are as follows: ? ?Significant drop in hemoglobin from 2 days ago though without overt GI bleeding noted.  She has still had intermittent rapid A-fib, but currently has a rate in the 60s.  She is tolerating soft diet and liquids without much pain and does not have nausea or vomiting. ? ?We will continue current therapy with high-dose twice daily PPI, regular dosing of Carafate, and I am holding off on any further EGD at this point. ?I will delay that to the outpatient setting unless she has a significant clinical change such as worsening dysphagia or overt GI bleeding. ? ?In case she is discharged in the next few days, I have already set an office appointment with [place] on May 30 at 11 AM, [address] in [place].  (Arrival 10:45 AM) ? ?No further recommendations at this point, we will sign off for inpatient work, but you may call again as needed.  Please provide this appointment with the patient with discharge instructions. ? ?[name] ?Office:[phone] ? ?  ?

## 2021-08-08 NOTE — Evaluation (Signed)
Physical Therapy Evaluation ?Patient Details ?Name: Betty Jackson ?MRN: [id number] ?DOB: 08-03-1934 ?Today's Date: 08/08/2021 ? ?History of Present Illness ? 87 y.o. female with history h/o CVA, paroxysmal atrial fibrillation on Eliquis, rheumatoid arthritis/giant cell arteritis on methotrexate/chronic prednisone, CKD stage IIIa, CAD, GERD, hyperlipidemia, depression/anxiety, osteoporosis, chronic back pain on fentanyl patch, chronic hypoxic respiratory failure on baseline 2 L O2 presented from SNF with complaints of dysphagia.  ?Clinical Impression ? Patient presents with decreased mobility.  Reports previously at facility able to get to bathroom on her own via walker, though uses wheelchair mainly.  She states was getting PT there and hopeful to resume.  Today ambulated short distances needing assist for safety as pt pushing walker out and noting LE fatigue with increased UE reliance and risk for falls.  Feel she will benefit from skilled PT in the acute setting to address deficits listed below and progress mobility back to her baseline.     ?   ? ?Recommendations for follow up therapy are one component of a multi-disciplinary discharge planning process, led by the attending physician.  Recommendations may be updated based on patient status, additional functional criteria and insurance authorization. ? ?Follow Up Recommendations Skilled nursing-short term rehab (<3 hours/day) ? ?  ?Assistance Recommended at Discharge Intermittent Supervision/Assistance  ?Patient can return home with the following ? A little help with walking and/or transfers;A little help with bathing/dressing/bathroom;Assist for transportation ? ?  ?Equipment Recommendations None recommended by PT  ?Recommendations for Other Services ?    ?  ?Functional Status Assessment Patient has had a recent decline in their functional status and demonstrates the ability to make significant improvements in function in a reasonable and predictable amount of time.   ? ?  ?Precautions / Restrictions Precautions ?Precautions: Fall  ? ?  ? ?Mobility ? Bed Mobility ?  ?  ?  ?  ?  ?  ?  ?General bed mobility comments: up in chair ?  ? ?Transfers ?Overall transfer level: Needs assistance ?Equipment used: Rolling walker (2 wheels) ?Transfers: Sit to/from Stand ?Sit to Stand: Min assist ?  ?  ?  ?  ?  ?General transfer comment: for safety and with cues for hand placement ?  ? ?Ambulation/Gait ?Ambulation/Gait assistance: Min guard, Min assist ?Gait Distance (Feet): 20 Feet (& 12' x 2) ?Assistive device: Rolling walker (2 wheels) ?Gait Pattern/deviations: Step-to pattern, Trunk flexed, Shuffle ?  ?  ?  ?General Gait Details: decreased foot clearance and heavy reliance on RW with flexed posture, needing assist for balance and walker proximity ? ?Stairs ?  ?  ?  ?  ?  ? ?Wheelchair Mobility ?  ? ?Modified Rankin (Stroke Patients Only) ?  ? ?  ? ?Balance Overall balance assessment: Needs assistance ?  ?Sitting balance-[name]: Good ?  ?  ?Standing balance support: Bilateral upper extremity supported, Reliant on assistive device for balance ?Standing balance-[name]: Poor ?  ?  ?  ?  ?  ?  ?  ?  ?  ?  ?  ?  ?   ? ? ? ?Pertinent Vitals/Pain Pain Assessment ?Pain Assessment: No/denies pain  ? ? ?Home Living Family/patient expects to be discharged to:: Skilled nursing facility ?  ?  ?  ?  ?  ?  ?  ?  ?  ?   ?  ?Prior Function Prior Level of Function : Needs assist ?  ?  ?  ?Physical Assist : ADLs (physical);Mobility (physical) ?  ?  ?  Mobility Comments: SNF staff assists to/from w/c. Pt able to self propel using BUE/LE. ?ADLs Comments: Able to self feed. Staff assists with all other ADLs. Pt reports meals are brought to her room. ?  ? ? ?Hand Dominance  ? Dominant Hand: Right ? ?  ?Extremity/Trunk Assessment  ? Upper Extremity Assessment ?Upper Extremity Assessment: LUE deficits/detail;RUE deficits/detail ?RUE Deficits / Details: shoulder elevation painful limited to about 110 with  strength grossly 3+/5, otherwise generalized weakness noted ?LUE Deficits / Details: shoulder elevation painful limited to about 110 with strength grossly 3+/5, otherwise generalized weakness noted ?  ? ?Lower Extremity Assessment ?Lower Extremity Assessment: Generalized weakness ?  ? ?Cervical / Trunk Assessment ?Cervical / Trunk Assessment: Kyphotic;Other exceptions ?Cervical / Trunk Exceptions: reports history of several back surgeries  ?Communication  ? Communication: HOH  ?Cognition Arousal/Alertness: Awake/alert ?Behavior During Therapy: [facility] for tasks assessed/performed ?Overall Cognitive Status: Within Functional Limits for tasks assessed ?  ?  ?  ?  ?  ?  ?  ?  ?  ?  ?  ?  ?  ?  ?  ?  ?  ?  ?  ? ?  ?General Comments General comments (skin integrity, edema, etc.): VSS on RA with ambulation SpO2 95%, HR 74 ? ?  ?Exercises    ? ?Assessment/Plan  ?  ?PT Assessment Patient needs continued PT services  ?PT Problem List Decreased strength;Decreased mobility;Decreased balance;Decreased activity tolerance;Decreased knowledge of use of DME ? ?   ?  ?PT Treatment Interventions DME instruction;Therapeutic activities;Gait training;Therapeutic exercise;Patient/family education;Balance training;Functional mobility training   ? ?PT Goals (Current goals can be found in the Care Plan section)  ?Acute Rehab PT Goals ?Patient Stated Goal: to return to facility and resume PT ?PT Goal Formulation: With patient ?Time For Goal Achievement: 08/22/21 ?Potential to Achieve Goals: Good ? ?  ?Frequency Min 2X/week ?  ? ? ?Co-evaluation   ?  ?  ?  ?  ? ? ?  ?AM-PAC PT "6 Clicks" Mobility  ?Outcome Measure Help needed turning from your back to your side while in a flat bed without using bedrails?: A Little ?Help needed moving from lying on your back to sitting on the side of a flat bed without using bedrails?: A Lot ?Help needed moving to and from a bed to a chair (including a wheelchair)?: A Little ?Help needed standing up from a chair  using your arms (e.g., wheelchair or bedside chair)?: A Little ?Help needed to walk in hospital room?: A Little ?Help needed climbing 3-5 steps with a railing? : Total ?6 Click Score: 15 ? ?  ?End of Session Equipment Utilized During Treatment: Gait belt ?Activity Tolerance: Patient tolerated treatment well ?Patient left: in chair;with call bell/phone within reach ?  ?PT Visit Diagnosis: Other abnormalities of gait and mobility (R26.89);Muscle weakness (generalized) (M62.81) ?  ? ?Time: 8657-8469 ?PT Time Calculation (min) (ACUTE ONLY): 35 min ? ? ?Charges:   PT Evaluation ?$PT Eval Moderate Complexity: 1 Mod ?PT Treatments ?$Gait Training: 8-22 mins ?  ?   ? ? ?Magda Kiel, PT ?Acute Rehabilitation Services ?GEXBM:[phone] ?Office:[phone] ?08/08/2021 ? ? ?Reginia Naas ?08/08/2021, 5:18 PM ? ?

## 2021-08-08 NOTE — Care Management Important Message (Signed)
Important Message ? ?Patient Details  ?Name: Betty Jackson ?MRN: [id number] ?Date of Birth: 04-Nov-1934 ? ? ?Medicare Important Message Given:  Yes ? ? ? ? ?Shelda Altes ?08/08/2021, 7:46 AM ?

## 2021-08-08 NOTE — Progress Notes (Signed)
? ?[facility] ?PROGRESS NOTE ? ? ?Betty Jackson LFY:[id number] DOB: Aug 01, 1934 DOA: 08/05/2021 ? ?PCP: Alvester Morin, MD ? ?Brief History/Interval Summary: 87 y.o. female with history h/o CVA, paroxysmal atrial fibrillation on Eliquis, rheumatoid arthritis/giant cell arteritis on methotrexate/chronic prednisone, CKD stage IIIa, CAD, GERD, hyperlipidemia, depression/anxiety, osteoporosis, chronic back pain on fentanyl patch, chronic hypoxic respiratory failure on baseline 2 L O2 presented from SNF with complaints of dysphagia.  Patient has a history of dysphagia and has had esophageal dilatations by GI in the past.  However this episode appeared to be more of a sudden onset.  Patient was hospitalized for further management.    ? ? ?Consultants: Gastroenterology ? ?Procedures:   ? ?EGD 5/9 ?Impression:               - Severe esophagitis with bleeding. ?                          - Gastritis. ?                          - Normal examined duodenum. ?                          - No specimens collected. ?                          This is more severe reflux than is expected from  ?                          GERD, and medication-induced esophagitis is  ?                          suspected (alendronate). ? ? ? ? ?Subjective/Interval History: ?Patient awake and alert this morning.  Apparently she converted back into atrial fibrillation just a few minutes prior to my arrival.  Denies any chest pain shortness of breath dizziness or lightheadedness.  Tolerating her diet.  Feels well.   ? ? ? ?Assessment/Plan: ? ?Dysphagia ?EGD was done on 5/9 and shows severe esophagitis.  Evidence for gastritis also noted.  Medication induced esophagitis is suspected.  Patient was on alendronate prior to admission.  This has been held.   ?Plan is to repeat upper endoscopy once her cardiac status is more stable.  Per gastroenterology notes this may need to wait several weeks.  Patient seems to be stable from a GI standpoint.   Tolerating her diet.  In view of this it is reasonable to delay the repeat endoscopy for a few weeks.  Continue Protonix. ? ?Atrial fibrillation with RVR ?Patient with known history of paroxysmal atrial fibrillation.  Presented with GI complaints as discussed above.  Went into rapid atrial fibrillation on 5/10.  Despite diltiazem infusion and parenteral doses of metoprolol patient's heart rate did not improve.  Cardiology was subsequently consulted and patient was started on amiodarone infusion ?Converted to sinus rhythm yesterday morning.  However this morning again noted to be in atrial fibrillation.  Amiodarone infusion to continue for now. ?Eliquis remains on hold for now. ?Prior to admission she was on Cardizem CD and metoprolol which are both on hold.  Metoprolol resumed today at a lower dose by cardiology. ? ?Acute respiratory failure with hypoxia/aspiration pneumonia ?Chest x-ray showed bibasilar infiltrates versus  atelectasis. ?WBC was noted to be elevated.  She was noted to be febrile. ?Fever has resolved.  Respiratory status continues to improve.  Continue Unasyn.  We will plan for 7-day treatment. ? ?Normocytic anemia ?Drop in hemoglobin noted from 11.5 to 8.8.  Has any overt bleeding.  Did have multiple bowel movements yesterday.  These have not been documented. ?Labs tomorrow.  Monitor for overt bleeding. ? ?History of chronic diastolic CHF ?Was given IV fluids for hypovolemia.  Diuretics on hold.  Volume status is stable.   ?Last echocardiogram from 2021 showed normal systolic function. ? ?Chronic kidney disease stage IIIb/mild hyperkalemia ?Renal function close to baseline.  Lokelma was given for elevated potassium level.  Improved.   ?Monitor urine output. ? ?Chronic back pain ?Noted to be on fentanyl patch. ? ?Coronary artery disease ?Holding aspirin and anticoagulants.  Chest pain was most likely due to tachyarrhythmia.  Appears to have improved once heart rate got better controlled. ? ?History of  rheumatoid arthritis/giant cell arteritis ?On prednisone chronically.  Since she is having difficulty swallowing tablets she was placed on hydrocortisone.  Should be able to switch her back to her chronic prednisone from tomorrow. ?Also on methotrexate as an outpatient which is currently on hold. ? ?Osteoporosis ?Holding alendronate due to concern for medication induced esophagitis. ? ?DVT Prophylaxis: Currently on hold.  SCDs ?Code Status: DNR ?Family Communication: Discussed with patient.  No family at bedside ?Disposition Plan: From skilled nursing facility ? ?Status is: Inpatient ?Remains inpatient appropriate because: Atrial fibrillation, dysphagia ? ? ? ? ? ?Medications: Scheduled: ? atorvastatin  80 mg Oral Daily  ? feeding supplement  1 Container Oral TID BM  ? fentaNYL  1 patch Transdermal Q72H  ? fentaNYL  1 patch Transdermal Q72H  ? hydrocortisone sod succinate (SOLU-CORTEF) inj  50 mg Intravenous Q12H  ? ipratropium-albuterol  3 mL Nebulization BID  ? metoprolol tartrate  50 mg Oral BID  ? pantoprazole (PROTONIX) IV  40 mg Intravenous Q12H  ? potassium chloride  40 mEq Oral Once  ? sucralfate  1 g Oral TID PC,HS,0200  ? ?Continuous: ? amiodarone 30 mg/hr (08/08/21 0931)  ? amiodarone    ? ampicillin-sulbactam (UNASYN) IV Stopped (08/08/21 0557)  ? ?CXK:[id number] **OR** acetaminophen, alum & mag hydroxide-simeth **AND** [COMPLETED] lidocaine, hydrALAZINE, metoprolol tartrate, nitroGLYCERIN, ondansetron (ZOFRAN) IV ? ?Antibiotics: ?Anti-infectives (From admission, onward)  ? ? Start     Dose/Rate Route Frequency Ordered Stop  ? 08/05/21 1830  ampicillin-sulbactam (UNASYN) 1.5 g in sodium chloride 0.9 % 100 mL IVPB       ? 1.5 g ?200 mL/hr over 30 Minutes Intravenous Every 6 hours 08/05/21 1757    ? ?  ? ? ?Objective: ? ?Vital Signs ? ?Vitals:  ? 08/08/21 0400 08/08/21 0600 08/08/21 0800 08/08/21 0817  ?BP: (!) 165/66 (!) 155/57 (!) 158/55   ?Pulse: 67 62 (!) 57   ?Resp: '17 16 17   '$ ?Temp: 98.9 ?F (37.2  ?C)     ?TempSrc: Oral     ?SpO2: 97% 100%  100%  ?Weight: 78.6 kg     ?Height:      ? ? ?Intake/Output Summary (Last 24 hours) at 08/08/2021 1130 ?Last data filed at 08/08/2021 0900 ?Gross per 24 hour  ?Intake 1997.58 ml  ?Output 300 ml  ?Net 1697.58 ml  ? ? ?Filed Weights  ? 08/06/21 0000 08/07/21 0400 08/08/21 0400  ?Weight: 76.6 kg 76.4 kg 78.6 kg  ? ? ?General appearance: Awake alert.  In no distress ?Resp: Diminished air entry at the bases with few crackles.  No wheezing or rhonchi. ?Cardio: S1-S2 is irregularly irregular ?GI: Abdomen is soft.  Nontender nondistended.  Bowel sounds are present normal.  No masses organomegaly ?Extremities: No edema.  Full range of motion of lower extremities. ?Neurologic:  No focal neurological deficits.  ? ? ? ? ?Lab Results: ? ?Data Reviewed: I have personally reviewed following labs and reports of the imaging studies ? ?CBC: ?Recent Labs  ?Lab 08/05/21 ?1029 08/06/21 ?1914 08/06/21 ?1952 08/08/21 ?7829  ?WBC 14.9* 12.4* 12.6* 9.3  ?HGB 11.6* 10.9* 11.5* 8.8*  ?HCT 37.9 35.5* 36.8 28.8*  ?MCV 98.4 97.8 97.4 95.7  ?PLT 214 147* 193 185  ? ? ? ?Basic Metabolic Panel: ?Recent Labs  ?Lab 08/06/21 ?5621 08/06/21 ?1419 08/06/21 ?1952 08/07/21 ?3086 08/08/21 ?5784  ?NA 142 143 145 145 141  ?K 5.2* 4.1 4.4 4.6 3.6  ?CL 110 109 109 113* 106  ?CO2 '24 25 24 23 26  '$ ?GLUCOSE 96 149* 151* 113* 111*  ?BUN 28* '23 22 20 23  '$ ?CREATININE 1.21* 0.98 1.04* 0.96 1.12*  ?CALCIUM 8.1* 8.0* 8.0* 7.9* 7.7*  ? ? ? ?GFR: ?Estimated Creatinine Clearance: 37.6 mL/min (A) (by C-G formula based on SCr of 1.12 mg/dL (H)). ? ?Liver Function Tests: ?Recent Labs  ?Lab 08/05/21 ?1029  ?AST 27  ?ALT 21  ?ALKPHOS 67  ?BILITOT 0.6  ?PROT 5.9*  ?ALBUMIN 3.2*  ? ? ? ?Recent Labs  ?Lab 08/05/21 ?1029  ?LIPASE 33  ? ? ? ? ? ?Radiology Studies: ?DG CHEST PORT 1 VIEW ? ?Result Date: 08/06/2021 ?CLINICAL DATA:  Lethargy EXAM: PORTABLE CHEST 1 VIEW COMPARISON:  08/05/2021, 03/14/2021 FINDINGS: Bandlike opacities at the left  base likely atelectasis. No consolidation or effusion. Stable cardiomediastinal silhouette. No pneumothorax IMPRESSION: Bandlike opacities at the left base, increased compared to prior and likely reflecting at

## 2021-08-08 NOTE — TOC Initial Note (Signed)
Transition of Care (TOC) - Initial/Assessment Note  ? ? ?Patient Details  ?Name: Betty Jackson ?MRN: [id number] ?Date of Birth: [birth year] ? ?Transition of Care (TOC) CM/SW Contact:    ?Coralee Pesa, LCSWA ?Phone Number: ?08/08/2021, 12:59 PM ? ?Clinical Narrative:                 ?CSW spoke with [facility] and confirmed that pt is Long term and can return when medically ready. They requested PT see her, in case she needs rehab as well. RN notified. Cherie is contact in case pt can DC over the weekend. [phone].  ?CSW spoke with dtr Threasa Beards, who is agreeable to pt returning to [place]. She states that depending how pt is doing at DC, she may be able to transport pt herself. If pt needs PT Josem Kaufmann will need to be started here. TOC will continue to follow for DC needs. ? ?Expected Discharge Plan: [place] ?Barriers to Discharge: Continued Medical Work up ? ? ?Patient Goals and CMS Choice ?Patient states their goals for this hospitalization and ongoing recovery are:: Pt unable to participate in goal setting at this time. ?CMS Medicare.gov Compare Post Acute Care list provided to:: Patient Represenative (must comment) (Daughter) ?Choice offered to / list presented to : Adult Children, Patient ? ?Expected Discharge Plan and Services ?Expected Discharge Plan: [place] ?  ?  ?Post Acute Care Choice: [place] ?Living arrangements for the past 2 months: [place] ?                ?  ?  ?  ?  ?  ?  ?  ?  ?  ?  ? ?Prior Living Arrangements/Services ?Living arrangements for the past 2 months: [place] ?Lives with:: Facility Resident ?Patient language and need for interpreter reviewed:: Yes ?Do you feel safe going back to the place where you live?: Yes      ?Need for Family Participation in Patient Care: Yes (Comment) ?Care giver support system in place?: Yes (comment) ?  ?Criminal Activity/Legal Involvement Pertinent to Current  Situation/Hospitalization: No - Comment as needed ? ?Activities of Daily Living ?  ?  ? ?Permission Sought/Granted ?Permission sought to share information with : Family Supports ?Permission granted to share information with : Yes, Verbal Permission Granted ? Share Information with NAME: Threasa Beards Gracia ? Permission granted to share info w AGENCY: [facility] ? Permission granted to share info w Relationship: Daughter ? Permission granted to share info w Contact Information: [phone] ? ?Emotional Assessment ?Appearance:: Appears stated age ?Attitude/Demeanor/Rapport: Engaged ?Affect (typically observed): Appropriate ?Orientation: : Oriented to Self, Oriented to Place, Oriented to  Time, Oriented to Situation ?Alcohol / Substance Use: Not Applicable ?Psych Involvement: No (comment) ? ?Admission diagnosis:  Esophageal dysphagia [R13.19] ?Dysphagia [R13.10] ?Acute on chronic respiratory failure with hypoxia (HCC) [J96.21] ?Atrial fibrillation with RVR ([place]) [I48.91] ?Patient Active Problem List  ? Diagnosis Date Noted  ? Atrial fibrillation with RVR ([place]) 08/06/2021  ? Dysphagia 08/05/2021  ? Acute metabolic encephalopathy 40/34/7425  ? Hypoxemia 03/15/2021  ? AKI (acute kidney injury) ([place]) 03/15/2021  ? Stroke-like symptoms 07/14/2020  ? Cough 05/24/2020  ? Dyspnea 05/24/2020  ? Optic neuritis 05/24/2020  ? Osteopenia 05/24/2020  ? Rash 05/24/2020  ? Vitamin D deficiency 05/24/2020  ? Palliative care by specialist   ? Goals of care, counseling/discussion   ? DNR (do not resuscitate)   ? Weakness   ? Advanced directives, counseling/discussion   ?  Pressure injury of skin 10/18/2019  ? Closed fracture of right distal femur ([place]) 10/17/2019  ? Femur fracture, right ([place]) 09/25/2019  ? Chronic diastolic CHF (congestive heart failure) ([name]) 09/25/2019  ? HCAP (healthcare-associated pneumonia) 06/19/2019  ? Encephalopathy 06/19/2019  ? Lumbar degenerative disc disease   ? Paroxysmal A-fib ([place])   ? Hypomagnesemia   ?  C. difficile colitis   ? Diarrhea 05/29/2019  ? CHF (congestive heart failure) ([place]) 05/29/2019  ? Recurrent falls 04/30/2019  ? Pericardial effusion without cardiac tamponade 03/29/2019  ? Hypoalbuminemia 03/27/2019  ? Cardiac tamponade   ? Pericarditis 03/06/2019  ? Fever   ? Septic shock ([place])   ? Atypical chest pain   ? Atrial fibrillation with rapid ventricular response ([place])   ? Nausea 01/17/2019  ? Acute diverticulitis 01/13/2019  ? Chronic back pain 01/13/2019  ? Tachycardia 01/13/2019  ? Hypokalemia 01/13/2019  ? Chronic low back pain 07/10/2018  ? Influenza A 05/20/2017  ? Severe sepsis ([place]) 05/20/2017  ? Acute respiratory failure with hypoxia ([place]) 05/20/2017  ? CKD (chronic kidney disease), stage III ([place]) 05/20/2017  ? PAF (paroxysmal atrial fibrillation) ([place]) 05/20/2017  ? HTN (hypertension) 05/20/2017  ? Giant cell arteritis ([place]) 05/20/2017  ? Coronary disease 05/20/2017  ? Abnormal urinalysis 05/20/2017  ? Osteoporosis 05/08/2016  ? Herniated nucleus pulposus, L5-S1, right 11/04/2015  ? Major depressive disorder, recurrent episode, moderate ([place]) 07/16/2013  ? Abdominal pain, epigastric 06/22/2013  ? Lumbar stenosis 04/25/2013  ? Chronic insomnia 02/07/2013  ? Elevated transaminase level 02/07/2013  ? CVA (cerebral infarction) 10/29/2012  ? Visual changes 10/28/2012  ? Depression 10/28/2012  ? Anxiety state 10/28/2012  ? Anemia, unspecified 10/28/2012  ? Nonspecific (abnormal) findings on radiological and other examination of gastrointestinal tract 01/25/2012  ? Hyponatremia 01/24/2012  ? Hematuria 01/24/2012  ? Enteritis 12/24/2011  ? Abdominal pain, other specified site 12/24/2011  ? Leg pain, right 02/18/2011  ? Rheumatoid arthritis ([place]) 02/04/2011  ? Temporal arteritis ([place]) 09/01/2010  ? Hypertension 09/01/2010  ? Hyperlipidemia 09/01/2010  ? History of atrial fibrillation 09/01/2010  ? CAD (coronary artery disease) 09/01/2010  ? GERD (gastroesophageal reflux disease) 09/01/2010  ? Insomnia  09/01/2010  ? ?PCP:  Alvester Morin, MD ?Pharmacy:  No Pharmacies Listed ? ? ? ?Social Determinants of Health (SDOH) Interventions ?  ? ?Readmission Risk Interventions ? ?  10/30/2019  ?  9:48 AM 09/26/2019  ?  4:29 PM 06/21/2019  ?  4:12 PM  ?Readmission Risk Prevention Plan  ?Transportation Screening Complete Complete Complete  ?Medication Review Press photographer) Complete Complete Complete  ?PCP or Specialist appointment within 3-5 days of discharge Complete Complete Not Complete  ?PCP/Specialist Appt Not Complete comments   likely SNF placement  ?[place] or Home Care Consult Complete Complete Not Complete  ?[place] or Home Care Consult Pt Refusal Comments   likely SNF  ?SW Recovery Care/Counseling Consult Complete Complete Complete  ?Palliative Care Screening Complete Complete Complete  ?Skilled Nursing Facility Not Applicable Complete Complete  ? ? ? ?

## 2021-08-09 DIAGNOSIS — I25119 Atherosclerotic heart disease of native coronary artery with unspecified angina pectoris: Secondary | ICD-10-CM

## 2021-08-09 LAB — BASIC METABOLIC PANEL
Anion gap: 8 (ref 5–15)
BUN: 20 mg/dL (ref 8–23)
CO2: 24 mmol/L (ref 22–32)
Calcium: 7.7 mg/dL — ABNORMAL LOW (ref 8.9–10.3)
Chloride: 106 mmol/L (ref 98–111)
Creatinine, Ser: 1.2 mg/dL — ABNORMAL HIGH (ref 0.44–1.00)
GFR, Estimated: 44 mL/min — ABNORMAL LOW (ref >60)
Glucose, Bld: 107 mg/dL — ABNORMAL HIGH (ref 70–99)
Potassium: 3.2 mmol/L — ABNORMAL LOW (ref 3.5–5.1)
Sodium: 138 mmol/L (ref 135–145)

## 2021-08-09 LAB — URINE CULTURE: Culture: 10000 — AB

## 2021-08-09 LAB — CBC
HCT: 29.1 % — ABNORMAL LOW (ref 36.0–46.0)
Hemoglobin: 9.1 g/dL — ABNORMAL LOW (ref 12.0–15.0)
MCH: 29.6 pg (ref 26.0–34.0)
MCHC: 31.3 g/dL (ref 30.0–36.0)
MCV: 94.8 fL (ref 80.0–100.0)
Platelets: 180 10*3/uL (ref 150–400)
RBC: 3.07 MIL/uL — ABNORMAL LOW (ref 3.87–5.11)
RDW: 14.5 % (ref 11.5–15.5)
WBC: 8.2 10*3/uL (ref 4.0–10.5)
nRBC: 0 % (ref 0.0–0.2)

## 2021-08-09 LAB — HEMOGLOBIN AND HEMATOCRIT, BLOOD
HCT: 28.7 % — ABNORMAL LOW (ref 36.0–46.0)
Hemoglobin: 9.2 g/dL — ABNORMAL LOW (ref 12.0–15.0)

## 2021-08-09 MED ORDER — AMIODARONE HCL 200 MG PO TABS
200.0000 mg | ORAL_TABLET | Freq: Two times a day (BID) | ORAL | Status: DC
  Administered 2021-08-09 – 2021-08-11 (×5): 200 mg via ORAL
  Filled 2021-08-09 (×5): qty 1

## 2021-08-09 MED ORDER — AMOXICILLIN-POT CLAVULANATE 875-125 MG PO TABS
1.0000 | ORAL_TABLET | Freq: Two times a day (BID) | ORAL | Status: DC
Start: 2021-08-09 — End: 2021-08-11
  Administered 2021-08-09 – 2021-08-11 (×5): 1 via ORAL
  Filled 2021-08-09 (×5): qty 1

## 2021-08-09 MED ORDER — AMIODARONE HCL 200 MG PO TABS: 200.0000 mg | ORAL_TABLET | Freq: Every day | ORAL | Status: DC

## 2021-08-09 MED ORDER — PREDNISONE 10 MG PO TABS: 10.0000 mg | ORAL_TABLET | Freq: Every day | ORAL | Status: DC

## 2021-08-09 MED ORDER — QUETIAPINE FUMARATE 25 MG PO TABS
12.5000 mg | ORAL_TABLET | Freq: Every day | ORAL | Status: DC
Start: 2021-08-09 — End: 2021-08-10
  Administered 2021-08-09 (×2): 12.5 mg via ORAL
  Filled 2021-08-09 (×2): qty 1

## 2021-08-09 MED ORDER — LOSARTAN POTASSIUM 25 MG PO TABS
25.0000 mg | ORAL_TABLET | Freq: Every day | ORAL | Status: DC
  Administered 2021-08-09 – 2021-08-11 (×3): 25 mg via ORAL
  Filled 2021-08-09 (×3): qty 1

## 2021-08-09 MED ORDER — IPRATROPIUM-ALBUTEROL 0.5-2.5 (3) MG/3ML IN SOLN
3.0000 mL | RESPIRATORY_TRACT | Status: DC | PRN
  Administered 2021-08-09: 3 mL via RESPIRATORY_TRACT
  Filled 2021-08-09: qty 3

## 2021-08-09 MED ORDER — IPRATROPIUM-ALBUTEROL 0.5-2.5 (3) MG/3ML IN SOLN: 3.0000 mL | RESPIRATORY_TRACT | Status: DC

## 2021-08-09 MED ORDER — PREDNISONE 20 MG PO TABS: 20.0000 mg | ORAL_TABLET | Freq: Every day | ORAL | Status: DC

## 2021-08-09 MED ORDER — APIXABAN 5 MG PO TABS
5.0000 mg | ORAL_TABLET | Freq: Two times a day (BID) | ORAL | Status: DC
  Administered 2021-08-09 – 2021-08-11 (×5): 5 mg via ORAL
  Filled 2021-08-09 (×5): qty 1

## 2021-08-09 MED ORDER — POTASSIUM CHLORIDE CRYS ER 20 MEQ PO TBCR
40.0000 meq | EXTENDED_RELEASE_TABLET | Freq: Once | ORAL | Status: AC
  Administered 2021-08-09: 40 meq via ORAL
  Filled 2021-08-09: qty 2

## 2021-08-09 MED ORDER — QUETIAPINE FUMARATE 25 MG PO TABS
12.5000 mg | ORAL_TABLET | Freq: Every day | ORAL | Status: DC
Start: 2021-08-09 — End: 2021-08-09

## 2021-08-09 MED ORDER — PREDNISONE 20 MG PO TABS
40.0000 mg | ORAL_TABLET | Freq: Every day | ORAL | Status: DC
  Administered 2021-08-10 – 2021-08-11 (×2): 40 mg via ORAL
  Filled 2021-08-09 (×2): qty 2

## 2021-08-09 MED ORDER — QUETIAPINE FUMARATE 25 MG PO TABS: 12.5000 mg | ORAL_TABLET | Freq: Every day | ORAL | Status: DC

## 2021-08-09 NOTE — Progress Notes (Signed)
? ?[facility] ?PROGRESS NOTE ? ? ?Betty Jackson YJE:[id number] DOB: [birth year] DOA: 08/05/2021 ? ?PCP: Alvester Morin, MD ? ?Brief History/Interval Summary: 87 y.o. female with history h/o CVA, paroxysmal atrial fibrillation on Eliquis, rheumatoid arthritis/giant cell arteritis on methotrexate/chronic prednisone, CKD stage IIIa, CAD, GERD, hyperlipidemia, depression/anxiety, osteoporosis, chronic back pain on fentanyl patch, chronic hypoxic respiratory failure on baseline 2 L O2 presented from SNF with complaints of dysphagia.  Patient has a history of dysphagia and has had esophageal dilatations by GI in the past.  However this episode appeared to be more of a sudden onset.  Patient was hospitalized for further management.    ? ? ?Consultants: Gastroenterology ? ?Procedures:   ? ?EGD 5/9 ?Impression:               - Severe esophagitis with bleeding. ?                          - Gastritis. ?                          - Normal examined duodenum. ?                          - No specimens collected. ?                          This is more severe reflux than is expected from  ?                          GERD, and medication-induced esophagitis is  ?                          suspected (alendronate). ? ? ? ? ?Subjective/Interval History: ?Patient is slightly somnolent this morning but easily arousable.  Answering questions appropriately.  Denies any chest pain shortness of breath.  States that she is feeling much better.   ? ? ? ?Assessment/Plan: ? ?Dysphagia ?EGD was done on 5/9 and shows severe esophagitis.  Evidence for gastritis also noted.  Medication induced esophagitis is suspected.  Patient was on alendronate prior to admission.  This has been held.   ?Plan is to repeat upper endoscopy once her cardiac status is more stable.  Per gastroenterology notes this may need to wait several weeks.   ?Patient has been stable from a GI standpoint.  Tolerating her diet.  Protonix can be changed to oral.  No overt  GI bleeding noted.   ? ?Atrial fibrillation with RVR ?Patient with known history of paroxysmal atrial fibrillation.  Presented with GI complaints as discussed above.  Went into rapid atrial fibrillation on 5/10.  Despite diltiazem infusion and parenteral doses of metoprolol patient's heart rate did not improve.  Cardiology was subsequently consulted and patient was started on amiodarone infusion ?Changed over to oral amiodarone today.  Due to bradycardia metoprolol has been discontinued.  Cardizem CD is also currently on hold. ?Eliquis was initially held for EGD.  Resumed today by cardiology.  Will recheck hemoglobin tomorrow. ? ?Acute respiratory failure with hypoxia/aspiration pneumonia ?Chest x-ray showed bibasilar infiltrates versus atelectasis. ?WBC was noted to be elevated.  She was noted to be febrile. ?Patient has improving.  Respiratory status is stable.  Should be able to wean her off of oxygen. ?We  will change Unasyn to Augmentin and will complete a 7-day treatment. ? ?Normocytic anemia ?Drop in hemoglobin noted from 11.5 to 8.8.  No overt bleeding noted.  Hemoglobin stable for the last 24 hours.  Recheck tomorrow morning.  ? ?History of chronic diastolic CHF ?Was given IV fluids for hypovolemia.  Diuretics on hold.  Volume status is stable.   ?Last echocardiogram from 2021 showed normal systolic function. ? ?Chronic kidney disease stage IIIb/mild hyperkalemia ?Renal function close to baseline.  Lokelma was given for elevated potassium level.  Improved.  Now mildly hypokalemic.  We will give her a dose of potassium today ?Monitor urine output. ? ?Chronic back pain ?Noted to be on fentanyl patch. ? ?Coronary artery disease ?Holding aspirin and anticoagulants.  Chest pain was most likely due to tachyarrhythmia.  Appears to have improved once heart rate got better controlled. ? ?History of rheumatoid arthritis/giant cell arteritis ?On prednisone chronically.  Since she is having difficulty swallowing tablets  she was placed on hydrocortisone.  We will switch her back to her usual prednisone from today. ?Also on methotrexate as an outpatient which is currently on hold. ? ?Osteoporosis ?Holding alendronate due to concern for medication induced esophagitis. ? ?DVT Prophylaxis: Currently on hold.  SCDs ?Code Status: DNR ?Family Communication: Discussed with patient.  No family at bedside ?Disposition Plan: From skilled nursing facility.  Anticipate discharge back to SNF tomorrow if hemoglobin and heart rate remains stable ? ?Status is: Inpatient ?Remains inpatient appropriate because: Atrial fibrillation, dysphagia ? ? ? ? ? ?Medications: Scheduled: ? amiodarone  200 mg Oral BID  ? Followed by  ? [START ON 08/16/2021] amiodarone  200 mg Oral Daily  ? apixaban  5 mg Oral BID  ? atorvastatin  80 mg Oral Daily  ? feeding supplement  1 Container Oral TID BM  ? fentaNYL  1 patch Transdermal Q72H  ? fentaNYL  1 patch Transdermal Q72H  ? hydrocortisone sod succinate (SOLU-CORTEF) inj  50 mg Intravenous Q12H  ? ipratropium-albuterol  3 mL Nebulization BID  ? losartan  25 mg Oral Daily  ? pantoprazole (PROTONIX) IV  40 mg Intravenous Q12H  ? QUEtiapine  12.5 mg Oral QHS  ? saccharomyces boulardii  250 mg Oral BID  ? sucralfate  1 g Oral TID PC,HS,0200  ? ?Continuous: ? ampicillin-sulbactam (UNASYN) IV 1.5 g (08/09/21 0506)  ? ?JAS:[id number] **OR** acetaminophen, alum & mag hydroxide-simeth **AND** [COMPLETED] lidocaine, hydrALAZINE, metoprolol tartrate, nitroGLYCERIN, ondansetron (ZOFRAN) IV ? ?Antibiotics: ?Anti-infectives (From admission, onward)  ? ? Start     Dose/Rate Route Frequency Ordered Stop  ? 08/05/21 1830  ampicillin-sulbactam (UNASYN) 1.5 g in sodium chloride 0.9 % 100 mL IVPB       ? 1.5 g ?200 mL/hr over 30 Minutes Intravenous Every 6 hours 08/05/21 1757 08/12/21 1759  ? ?  ? ? ?Objective: ? ?Vital Signs ? ?Vitals:  ? 08/08/21 2005 08/08/21 2359 08/09/21 0000 08/09/21 0438  ?BP:  (!) 187/59 (!) 170/65 (!) 161/72   ?Pulse: (!) 56   62  ?Resp: (!) '23 14  19  '$ ?Temp:    98.3 ?F (36.8 ?C)  ?TempSrc:    Oral  ?SpO2: 100%   99%  ?Weight:      ?Height:      ? ? ?Intake/Output Summary (Last 24 hours) at 08/09/2021 1116 ?Last data filed at 08/09/2021 0439 ?Gross per 24 hour  ?Intake 284.32 ml  ?Output 700 ml  ?Net -415.68 ml  ? ? ?Filed Weights  ?  08/06/21 0000 08/07/21 0400 08/08/21 0400  ?Weight: 76.6 kg 76.4 kg 78.6 kg  ? ? ?General appearance: Awake alert.  In no distress ?Resp: Improved air entry bilaterally.  Few crackles at the bases.  No wheezing or rhonchi. ?Cardio: S1-S2 is normal regular.  No S3-S4.  No rubs murmurs or bruit ?GI: Abdomen is soft.  Nontender nondistended.  Bowel sounds are present normal.  No masses organomegaly ?Extremities: No edema.  Full range of motion of lower extremities. ?Neurologic:  No focal neurological deficits.  ? ? ? ?Lab Results: ? ?Data Reviewed: I have personally reviewed following labs and reports of the imaging studies ? ?CBC: ?Recent Labs  ?Lab 08/05/21 ?1029 08/06/21 ?5170 08/06/21 ?1952 08/08/21 ?0174 08/08/21 ?1541 08/08/21 ?2017 08/09/21 ?0149  ?WBC 14.9* 12.4* 12.6* 9.3  --   --  8.2  ?HGB 11.6* 10.9* 11.5* 8.8* 9.8* 9.2* 9.1*  ?HCT 37.9 35.5* 36.8 28.8* 31.9* 29.5* 29.1*  ?MCV 98.4 97.8 97.4 95.7  --   --  94.8  ?PLT 214 147* 193 185  --   --  180  ? ? ? ?Basic Metabolic Panel: ?Recent Labs  ?Lab 08/06/21 ?1419 08/06/21 ?1952 08/07/21 ?9449 08/08/21 ?6759 08/09/21 ?0149  ?NA 143 145 145 141 138  ?K 4.1 4.4 4.6 3.6 3.2*  ?CL 109 109 113* 106 106  ?CO2 '25 24 23 26 24  '$ ?GLUCOSE 149* 151* 113* 111* 107*  ?BUN '23 22 20 23 20  '$ ?CREATININE 0.98 1.04* 0.96 1.12* 1.20*  ?CALCIUM 8.0* 8.0* 7.9* 7.7* 7.7*  ? ? ? ?GFR: ?Estimated Creatinine Clearance: 35.1 mL/min (A) (by C-G formula based on SCr of 1.2 mg/dL (H)). ? ?Liver Function Tests: ?Recent Labs  ?Lab 08/05/21 ?1029  ?AST 27  ?ALT 21  ?ALKPHOS 67  ?BILITOT 0.6  ?PROT 5.9*  ?ALBUMIN 3.2*  ? ? ? ?Recent Labs  ?Lab 08/05/21 ?1029  ?LIPASE 33   ? ? ? ? ? ?Radiology Studies: ?No results found. ? ? ? ? LOS: 3 days  ? ?Bonnielee Haff ? ?[facility] ?Pager on [email or web address] ? ?08/09/2021, 11:16 AM ? ? ?

## 2021-08-09 NOTE — Progress Notes (Signed)
At 2354 patient HR sustaining at 50's. Amiodarone gtt paused and paged MD and advised to stop amiodarone for now.  ?Pt verbalized that she haven't had sleep for the past two nights, paged MD and put an order. Patient refuse to take melatonin '5mg'$  and verbalized it will not going to help as she takes Seroquel 25 mg at home. MD place new order around 4 am patient still ask for Seroquel.  ?

## 2021-08-09 NOTE — [place] FL2 (Signed)
?[place] MEDICAID FL2 LEVEL OF CARE SCREENING TOOL  ?  ? ?IDENTIFICATION  ?Patient Name: ?Betty Jackson Birthdate: [birth year] Sex: female Admission Date (Current Location): ?08/05/2021  ?[place] and [place] Number: ? [place] ?  Facility and Address:  ?[facility] [place], [place] [address] [place], [place] [address] ?     Provider Number: ?[id number]  ?Attending Physician Name and Address:  ?Bonnielee Haff, MD ? Relative Name and Phone Number:  ?Vogelsinger,Melissa (Daughter)   [phone] ?   ?Current Level of Care: ?Hospital Recommended Level of Care: ?[place] Prior Approval Number: ?  ? ?Date Approved/Denied: ?  PASRR Number: ?[id number] H ? ?Discharge Plan: ?SNF ?  ? ?Current Diagnoses: ?Patient Active Problem List  ? Diagnosis Date Noted  ? Atrial fibrillation with RVR ([place]) 08/06/2021  ? Dysphagia 08/05/2021  ? Acute metabolic encephalopathy 24/23/5361  ? Hypoxemia 03/15/2021  ? AKI (acute kidney injury) ([place]) 03/15/2021  ? Stroke-like symptoms 07/14/2020  ? Cough 05/24/2020  ? Dyspnea 05/24/2020  ? Optic neuritis 05/24/2020  ? Osteopenia 05/24/2020  ? Rash 05/24/2020  ? Vitamin D deficiency 05/24/2020  ? Palliative care by specialist   ? Goals of care, counseling/discussion   ? DNR (do not resuscitate)   ? Weakness   ? Advanced directives, counseling/discussion   ? Pressure injury of skin 10/18/2019  ? Closed fracture of right distal femur ([place]) 10/17/2019  ? Femur fracture, right ([place]) 09/25/2019  ? Chronic diastolic CHF (congestive heart failure) ([place]) 09/25/2019  ? HCAP (healthcare-associated pneumonia) 06/19/2019  ? Encephalopathy 06/19/2019  ? Lumbar degenerative disc disease   ? Paroxysmal A-fib ([place])   ? Hypomagnesemia   ? C. difficile colitis   ? Diarrhea 05/29/2019  ? CHF (congestive heart failure) ([place]) 05/29/2019  ? Recurrent falls 04/30/2019  ? Pericardial effusion without cardiac tamponade 03/29/2019  ? Hypoalbuminemia 03/27/2019  ? Cardiac tamponade   ?  Pericarditis 03/06/2019  ? Fever   ? Septic shock ([place])   ? Atypical chest pain   ? Atrial fibrillation with rapid ventricular response ([place])   ? Nausea 01/17/2019  ? Acute diverticulitis 01/13/2019  ? Chronic back pain 01/13/2019  ? Tachycardia 01/13/2019  ? Hypokalemia 01/13/2019  ? Chronic low back pain 07/10/2018  ? Influenza A 05/20/2017  ? Severe sepsis ([place]) 05/20/2017  ? Acute respiratory failure with hypoxia ([place]) 05/20/2017  ? CKD (chronic kidney disease), stage III ([place]) 05/20/2017  ? PAF (paroxysmal atrial fibrillation) ([place]) 05/20/2017  ? HTN (hypertension) 05/20/2017  ? Giant cell arteritis ([place]) 05/20/2017  ? Coronary disease 05/20/2017  ? Abnormal urinalysis 05/20/2017  ? Osteoporosis 05/08/2016  ? Herniated nucleus pulposus, L5-S1, right 11/04/2015  ? Major depressive disorder, recurrent episode, moderate ([place]) 07/16/2013  ? Abdominal pain, epigastric 06/22/2013  ? Lumbar stenosis 04/25/2013  ? Chronic insomnia 02/07/2013  ? Elevated transaminase level 02/07/2013  ? CVA (cerebral infarction) 10/29/2012  ? Visual changes 10/28/2012  ? Depression 10/28/2012  ? Anxiety state 10/28/2012  ? Anemia, unspecified 10/28/2012  ? Nonspecific (abnormal) findings on radiological and other examination of gastrointestinal tract 01/25/2012  ? Hyponatremia 01/24/2012  ? Hematuria 01/24/2012  ? Enteritis 12/24/2011  ? Abdominal pain, other specified site 12/24/2011  ? Leg pain, right 02/18/2011  ? Rheumatoid arthritis ([place]) 02/04/2011  ? Temporal arteritis ([place]) 09/01/2010  ? Hypertension 09/01/2010  ? Hyperlipidemia 09/01/2010  ? History of atrial fibrillation 09/01/2010  ? CAD (coronary artery disease) 09/01/2010  ? GERD (gastroesophageal reflux disease) 09/01/2010  ? Insomnia 09/01/2010  ? ? ?  Orientation RESPIRATION BLADDER Height & Weight   ?  ?Self, Time, Situation, Place ? O2 ([place] 1 Lit) Continent, External catheter Weight: 173 lb 4.5 oz (78.6 kg) ?Height:  5' 4.49" (163.8 cm)  ?BEHAVIORAL SYMPTOMS/MOOD  NEUROLOGICAL BOWEL NUTRITION STATUS  ?    Continent Diet (See dc summary)  ?AMBULATORY STATUS COMMUNICATION OF NEEDS Skin   ?Extensive Assist Verbally Normal ?  ?  ?  ?    ?     ?     ? ? ?Personal Care Assistance Level of Assistance  ?Bathing, Feeding, Dressing Bathing Assistance: Limited assistance ?Feeding assistance: Independent ?Dressing Assistance: Limited assistance ?   ? ?Functional Limitations Info  ?Hearing, Speech, Sight Sight Info: Impaired ?Hearing Info: Impaired ?Speech Info: Adequate  ? ? ?SPECIAL CARE FACTORS FREQUENCY  ?PT (By licensed PT), OT (By licensed OT)   ?  ?PT Frequency: 5x week ?OT Frequency: 5x week ?  ?  ?  ?   ? ? ?Contractures Contractures Info: Not present  ? ? ?Additional Factors Info  ?Code Status, Allergies Code Status Info: DNR ?Allergies Info: Dextromethorphan ?  ?  ?  ?   ? ?Current Medications (08/09/2021):  This is the current hospital active medication list ?Current Facility-Administered Medications  ?Medication Dose Route Frequency Provider Last Rate Last Admin  ? acetaminophen (TYLENOL) tablet 650 mg  650 mg Oral Q4H PRN Vernelle Emerald, MD   650 mg at 08/09/21 0844  ? Or  ? acetaminophen (TYLENOL) suppository 650 mg  650 mg Rectal Q4H PRN Vernelle Emerald, MD   650 mg at 08/06/21 0502  ? alum & mag hydroxide-simeth (MAALOX/MYLANTA) 200-200-20 MG/5ML suspension 30 mL  30 mL Oral Q6H PRN Esterwood, Amy S, PA-C      ? amiodarone (PACERONE) tablet 200 mg  200 mg Oral BID Geralynn Rile, MD   200 mg at 08/09/21 0845  ? Followed by  ? Derrill Memo ON 08/16/2021] amiodarone (PACERONE) tablet 200 mg  200 mg Oral Daily O'Neal, Cassie Freer, MD      ? ampicillin-sulbactam (UNASYN) 1.5 g in sodium chloride 0.9 % 100 mL IVPB  1.5 g Intravenous Q6H Bonnielee Haff, MD 200 mL/hr at 08/09/21 0506 1.5 g at 08/09/21 0506  ? apixaban (ELIQUIS) tablet 5 mg  5 mg Oral BID Geralynn Rile, MD      ? atorvastatin (LIPITOR) tablet 80 mg  80 mg Oral Daily [place] Shi, MD   80 mg  at 08/09/21 0845  ? feeding supplement (BOOST / RESOURCE BREEZE) liquid 1 Container  1 Container Oral TID BM Doran Stabler, MD   1 Container at 08/08/21 2012  ? fentaNYL (DURAGESIC) 12 MCG/HR 1 patch  1 patch Transdermal Q72H [place] Shi, MD   1 patch at 08/07/21 1300  ? fentaNYL (DURAGESIC) 25 MCG/HR 1 patch  1 patch Transdermal Q72H [name], MD   1 patch at 08/07/21 1300  ? hydrALAZINE (APRESOLINE) injection 10 mg  10 mg Intravenous Q6H PRN [name], MD      ? hydrocortisone sodium succinate (SOLU-CORTEF) 100 MG injection 50 mg  50 mg Intravenous Q12H Bonnielee Haff, MD   50 mg at 08/09/21 0847  ? ipratropium-albuterol (DUONEB) 0.5-2.5 (3) MG/3ML nebulizer solution 3 mL  3 mL Nebulization BID [name], MD   3 mL at 08/08/21 2003  ? losartan (COZAAR) tablet 25 mg  25 mg Oral Daily O'Neal, Cassie Freer, MD   25 mg  at 08/09/21 0844  ? metoprolol tartrate (LOPRESSOR) injection 5 mg  5 mg Intravenous Q6H PRN Burnell Blanks, MD      ? nitroGLYCERIN (NITROSTAT) SL tablet 0.4 mg  0.4 mg Sublingual Q5 min PRN Isaiah Serge, NP      ? ondansetron [facility]) injection 4 mg  4 mg Intravenous Q6H PRN [name], MD   4 mg at 08/06/21 2107  ? pantoprazole (PROTONIX) injection 40 mg  40 mg Intravenous Q12H [name], MD   40 mg at 08/09/21 0846  ? QUEtiapine (SEROQUEL) tablet 12.5 mg  12.5 mg Oral QHS Shela Leff, MD   12.5 mg at 08/09/21 0353  ? saccharomyces boulardii (FLORASTOR) capsule 250 mg  250 mg Oral BID Bonnielee Haff, MD   250 mg at 08/09/21 0845  ? sucralfate (CARAFATE) 1 GM/10ML suspension 1 g  1 g Oral TID PC,HS,0200 Esterwood, Amy S, PA-C   1 g at 08/09/21 0846  ? ? ? ?Discharge Medications: ?Please see discharge summary for a list of discharge medications. ? ?Relevant Imaging Results: ? ?Relevant Lab Results: ? ? ?Additional Information ?SSN [id number] ? ?Ellanora Rayborn B Mayline Dragon, LCSWA ? ? ? ? ?

## 2021-08-09 NOTE — Plan of Care (Signed)

## 2021-08-09 NOTE — Evaluation (Signed)
Occupational Therapy Evaluation ?Patient Details ?Name: Betty Jackson ?MRN: [id number] ?DOB: 1934-09-26 ?Today's Date: 08/09/2021 ? ? ?History of Present Illness 87 y.o. female with history h/o CVA, paroxysmal atrial fibrillation on Eliquis, rheumatoid arthritis/giant cell arteritis on methotrexate/chronic prednisone, CKD stage IIIa, CAD, GERD, hyperlipidemia, depression/anxiety, osteoporosis, chronic back pain on fentanyl patch, chronic hypoxic respiratory failure on baseline 2 L O2 presented from SNF with complaints of dysphagia.  ? ?Clinical Impression ?  ?Prior to this admission, patient residing at a SNF with staff assisting with transfers to w/c (though can ambulate minimally) and for all ADLs and IADLs. Currently, patient presenting with increased lethargy and confusion, and decreased activity tolerance. Patient stating that we [place] the [place] and they were riding in a helicopter all night and she was minding the children so she wasnt sure where she ended up when asked orientation questions, but after told correct orientation was able to verbalize city and state of hospital correctly. Patient engaging in ADLs EOB, (currently Mod A for ADLs and supervision for bed mobility) politely declining OOB due to fatigue but encouraged to attempt OOB movement over the weekend (per report patient didn't sleep all night and is more lethargic to date). OT recommending rehab at SNF to promote continued activity tolerance and maintenance of skills. OT will continue to follow acutely.  ?   ? ?Recommendations for follow up therapy are one component of a multi-disciplinary discharge planning process, led by the attending physician.  Recommendations may be updated based on patient status, additional functional criteria and insurance authorization.  ? ?Follow Up Recommendations ? Skilled nursing-short term rehab (<3 hours/day)  ?  ?Assistance Recommended at Discharge Frequent or constant Supervision/Assistance   ?Patient can return home with the following A lot of help with walking and/or transfers;A lot of help with bathing/dressing/bathroom;Assistance with cooking/housework;Direct supervision/assist for medications management;Direct supervision/assist for financial management;Assist for transportation;Help with stairs or ramp for entrance ? ?  ?Functional Status Assessment ? Patient has had a recent decline in their functional status and demonstrates the ability to make significant improvements in function in a reasonable and predictable amount of time.  ?Equipment Recommendations ? Other (comment);None recommended by OT (Patient has DME needed)  ?  ?Recommendations for Other Services   ? ? ?  ?Precautions / Restrictions Precautions ?Precautions: Fall ?Restrictions ?Weight Bearing Restrictions: No  ? ?  ? ?Mobility Bed Mobility ?Overal bed mobility: Needs Assistance ?Bed Mobility: Supine to Sit, Sit to Supine ?  ?  ?Supine to sit: Supervision ?Sit to supine: Supervision ?  ?  ?  ? ?Transfers ?  ?  ?  ?  ?  ?  ?  ?  ?  ?General transfer comment: politely declining due to fatigue ?  ? ?  ?Balance Overall balance assessment: Needs assistance ?  ?Sitting balance-[name]: Good ?  ?  ?  ?  ?  ?  ?  ?  ?  ?  ?  ?  ?  ?  ?  ?  ?   ? ?ADL either performed or assessed with clinical judgement  ? ?ADL Overall ADL's : Needs assistance/impaired ?Eating/Feeding: Set up;Sitting ?  ?Grooming: Wash/dry hands;Wash/dry face;Set up;Sitting ?  ?Upper Body Bathing: Set up;Min guard;Sitting ?  ?Lower Body Bathing: Moderate assistance;Sitting/lateral leans ?  ?Upper Body Dressing : Set up;Min guard;Sitting ?  ?Lower Body Dressing: Moderate assistance;Sitting/lateral leans ?  ?Toilet Transfer: Moderate assistance;Stand-pivot ?  ?Toileting- Clothing Manipulation and Hygiene: Moderate assistance;Sitting/lateral lean;Sit  to/from stand ?  ?  ?  ?Functional mobility during ADLs: Moderate assistance;Rolling walker (2 wheels);Cueing for  safety;Cueing for sequencing ?General ADL Comments: Patient presenting with decreased activity tolerance, confusion, and lethargy  ? ? ? ?Vision Baseline Vision/History: 1 Wears glasses ?Ability to See in Adequate Light: 0 Adequate ?Patient Visual Report: No change from baseline ?   ?   ?Perception   ?  ?Praxis   ?  ? ?Pertinent Vitals/Pain Pain Assessment ?Pain Assessment: No/denies pain  ? ? ? ?Hand Dominance Right ?  ?Extremity/Trunk Assessment Upper Extremity Assessment ?Upper Extremity Assessment: Generalized weakness;RUE deficits/detail;LUE deficits/detail (Significant arthritis noted bilaterally) ?RUE Deficits / Details: shoulder elevation painful limited to about 110 with strength grossly 3+/5, otherwise generalized weakness noted ?RUE Coordination: decreased fine motor ?LUE Deficits / Details: shoulder elevation painful limited to about 110 with strength grossly 3+/5, otherwise generalized weakness noted ?LUE Coordination: decreased fine motor ?  ?Lower Extremity Assessment ?Lower Extremity Assessment: Defer to PT evaluation ?  ?Cervical / Trunk Assessment ?Cervical / Trunk Assessment: Kyphotic;Other exceptions ?Cervical / Trunk Exceptions: reports history of several back surgeries ?  ?Communication Communication ?Communication: HOH ?  ?Cognition Arousal/Alertness: Awake/alert, Lethargic ?Behavior During Therapy: [facility] for tasks assessed/performed ?Overall Cognitive Status: Impaired/Different from baseline ?Area of Impairment: Orientation, Following commands, Problem solving ?  ?  ?  ?  ?  ?  ?  ?  ?Orientation Level: Place ?  ?  ?Following Commands: Follows one step commands with increased time ?  ?  ?Problem Solving: Slow processing, Difficulty sequencing ?General Comments: Patient stating that we were in the lower part of [place] and they were riding in a helicopter all night and she was minding the children so she wasnt sure where she ended up ?  ?  ?General Comments  Patient on 2L O2 reading at 97%  VSS ? ?  ?Exercises   ?  ?Shoulder Instructions    ? ? ?Home Living Family/patient expects to be discharged to:: Skilled nursing facility ?  ?  ?  ?  ?  ?  ?  ?  ?  ?  ?  ?  ?  ?  ?  ?  ?  ?  ? ?  ?Prior Functioning/Environment Prior Level of Function : Needs assist ?  ?  ?  ?Physical Assist : ADLs (physical);Mobility (physical) ?  ?  ?Mobility Comments: SNF staff assists to/from w/c. Pt able to self propel using BUE/LE. ?ADLs Comments: Able to self feed. Staff assists with all other ADLs. Pt reports meals are brought to her room. ?  ? ?  ?  ?OT Problem List: Decreased strength;Decreased range of motion;Decreased activity tolerance;Impaired balance (sitting and/or standing);Decreased coordination;Decreased cognition;Decreased safety awareness;Decreased knowledge of use of DME or AE ?  ?   ?OT Treatment/Interventions: Self-care/ADL training;Therapeutic exercise;Energy conservation;DME and/or AE instruction;Manual therapy;Therapeutic activities;Cognitive remediation/compensation;Patient/family education;Balance training  ?  ?OT Goals(Current goals can be found in the care plan section) Acute Rehab OT Goals ?Patient Stated Goal: to feel better ?OT Goal Formulation: With patient ?Time For Goal Achievement: 08/23/21 ?Potential to Achieve Goals: Good  ?OT Frequency: Min 2X/week ?  ? ?Co-evaluation   ?  ?  ?  ?  ? ?  ?AM-PAC OT "6 Clicks" Daily Activity     ?Outcome Measure Help from another person eating meals?: A Little ?Help from another person taking care of personal grooming?: A Little ?Help from another person toileting, which includes using toliet, bedpan, or urinal?: A Lot ?Help  from another person bathing (including washing, rinsing, drying)?: A Lot ?Help from another person to put on and taking off regular upper body clothing?: A Little ?Help from another person to put on and taking off regular lower body clothing?: A Lot ?6 Click Score: 15 ?  ?End of Session Nurse Communication: Mobility status ? ?Activity  Tolerance: Patient limited by lethargy;Patient limited by fatigue ?Patient left: in bed;with call bell/phone within reach;with bed alarm set ? ?OT Visit Diagnosis: Unsteadiness on feet (R26.81);Other abnormalities of gait and m

## 2021-08-09 NOTE — Progress Notes (Signed)
?Cardiology Progress Note  ?Patient ID: Betty Jackson ?MRN: [id number] ?DOB: 1934-04-23 ?Date of Encounter: 08/09/2021 ? ?Primary Cardiologist: [name], MD ? ?Subjective  ? ?Chief Complaint: SOB ? ?HPI: Still with shortness of breath.  Maintaining sinus rhythm.  Being treated for pneumonia.  No evidence of volume overload. ? ?ROS:  ?All other ROS reviewed and negative. Pertinent positives noted in the HPI.    ? ?Inpatient Medications  ?Scheduled Meds: ? amiodarone  200 mg Oral BID  ? Followed by  ? [START ON 08/16/2021] amiodarone  200 mg Oral Daily  ? atorvastatin  80 mg Oral Daily  ? feeding supplement  1 Container Oral TID BM  ? fentaNYL  1 patch Transdermal Q72H  ? fentaNYL  1 patch Transdermal Q72H  ? hydrocortisone sod succinate (SOLU-CORTEF) inj  50 mg Intravenous Q12H  ? ipratropium-albuterol  3 mL Nebulization BID  ? pantoprazole (PROTONIX) IV  40 mg Intravenous Q12H  ? QUEtiapine  12.5 mg Oral QHS  ? saccharomyces boulardii  250 mg Oral BID  ? sucralfate  1 g Oral TID PC,HS,0200  ? ?Continuous Infusions: ? ampicillin-sulbactam (UNASYN) IV 1.5 g (08/09/21 0506)  ? ?PRN Meds: ?acetaminophen **OR** acetaminophen, alum & mag hydroxide-simeth **AND** [COMPLETED] lidocaine, hydrALAZINE, metoprolol tartrate, nitroGLYCERIN, ondansetron (ZOFRAN) IV  ? ?Vital Signs  ? ?Vitals:  ? 08/08/21 2005 08/08/21 2359 08/09/21 0000 08/09/21 0438  ?BP:  (!) 187/59 (!) 170/65 (!) 161/72  ?Pulse: (!) 56   62  ?Resp: (!) '23 14  19  '$ ?Temp:    98.3 ?F (36.8 ?C)  ?TempSrc:    Oral  ?SpO2: 100%   99%  ?Weight:      ?Height:      ? ? ?Intake/Output Summary (Last 24 hours) at 08/09/2021 0804 ?Last data filed at 08/09/2021 0439 ?Gross per 24 hour  ?Intake 604.32 ml  ?Output 700 ml  ?Net -95.68 ml  ? ? ?  08/08/2021  ?  4:00 AM 08/07/2021  ?  4:00 AM 08/06/2021  ? 12:00 AM  ?Last 3 Weights  ?Weight (lbs) 173 lb 4.5 oz 168 lb 6.9 oz 168 lb 14 oz  ?Weight (kg) 78.6 kg 76.4 kg 76.6 kg  ?   ? ?Telemetry  ?Overnight telemetry shows SB  50-60 bpm, which I personally reviewed.  ? ? ? ?Physical Exam  ? ?Vitals:  ? 08/08/21 2005 08/08/21 2359 08/09/21 0000 08/09/21 0438  ?BP:  (!) 187/59 (!) 170/65 (!) 161/72  ?Pulse: (!) 56   62  ?Resp: (!) '23 14  19  '$ ?Temp:    98.3 ?F (36.8 ?C)  ?TempSrc:    Oral  ?SpO2: 100%   99%  ?Weight:      ?Height:      ?  ?Intake/Output Summary (Last 24 hours) at 08/09/2021 0804 ?Last data filed at 08/09/2021 0439 ?Gross per 24 hour  ?Intake 604.32 ml  ?Output 700 ml  ?Net -95.68 ml  ?  ? ?  08/08/2021  ?  4:00 AM 08/07/2021  ?  4:00 AM 08/06/2021  ? 12:00 AM  ?Last 3 Weights  ?Weight (lbs) 173 lb 4.5 oz 168 lb 6.9 oz 168 lb 14 oz  ?Weight (kg) 78.6 kg 76.4 kg 76.6 kg  ?  Body mass index is 29.3 kg/m?.  ?General: Well nourished, well developed, in no acute distress ?Head: Atraumatic, normal size  ?Eyes: PEERLA, EOMI  ?Neck: Supple, no JVD ?Endocrine: No thryomegaly ?Cardiac: Normal S1, S2; RRR; no murmurs, rubs, or gallops ?Lungs:  Diminished breath sounds bilaterally ?Abd: Soft, nontender, no hepatomegaly  ?Ext: No edema, pulses 2+ ?Musculoskeletal: No deformities, BUE and BLE strength normal and equal ?Skin: Warm and dry, no rashes   ?Neuro: Alert and oriented to person, place, time, and situation, CNII-XII grossly intact, no focal deficits  ?Psych: Normal mood and affect  ? ?Labs  ?High Sensitivity Troponin:   ?Recent Labs  ?Lab 08/06/21 ?1419 08/06/21 ?1600  ?TROPONINIHS 68* 78*  ?   ?Cardiac EnzymesNo results for input(s): TROPONINI in the last 168 hours. No results for input(s): TROPIPOC in the last 168 hours.  ?Chemistry ?Recent Labs  ?Lab 08/05/21 ?1029 08/06/21 ?4081 08/07/21 ?4481 08/08/21 ?8563 08/09/21 ?0149  ?NA 141   < > 145 141 138  ?K 4.3   < > 4.6 3.6 3.2*  ?CL 106   < > 113* 106 106  ?CO2 30   < > '23 26 24  '$ ?GLUCOSE 103*   < > 113* 111* 107*  ?BUN 22   < > '20 23 20  '$ ?CREATININE 1.12*   < > 0.96 1.12* 1.20*  ?CALCIUM 8.6*   < > 7.9* 7.7* 7.7*  ?PROT 5.9*  --   --   --   --   ?ALBUMIN 3.2*  --   --   --   --   ?AST  27  --   --   --   --   ?ALT 21  --   --   --   --   ?ALKPHOS 67  --   --   --   --   ?BILITOT 0.6  --   --   --   --   ?GFRNONAA 48*   < > 58* 48* 44*  ?ANIONGAP 5   < > '9 9 8  '$ ? < > = values in this interval not displayed.  ?  ?Hematology ?Recent Labs  ?Lab 08/06/21 ?1952 08/08/21 ?0412 08/08/21 ?1541 08/08/21 ?2017 08/09/21 ?0149  ?WBC 12.6* 9.3  --   --  8.2  ?RBC 3.78* 3.01*  --   --  3.07*  ?HGB 11.5* 8.8* 9.8* 9.2* 9.1*  ?HCT 36.8 28.8* 31.9* 29.5* 29.1*  ?MCV 97.4 95.7  --   --  94.8  ?MCH 30.4 29.2  --   --  29.6  ?MCHC 31.3 30.6  --   --  31.3  ?RDW 14.9 14.9  --   --  14.5  ?PLT 193 185  --   --  180  ? ?BNPNo results for input(s): BNP, PROBNP in the last 168 hours.  ?DDimer No results for input(s): DDIMER in the last 168 hours.  ? ?Radiology  ?No results found. ? ?Cardiac Studies  ?TTE 04/17/2019 ? 1. Left ventricular ejection fraction, by visual estimation, is 60 to  ?65%. The left ventricle has normal function. There is mildly increased  ?left ventricular hypertrophy.  ? 2. Left ventricular diastolic parameters are consistent with Grade II  ?diastolic dysfunction (pseudonormalization). The mean left atrial pressure  ?is high.  ? 3. The left ventricle has no regional wall motion abnormalities.  ? 4. Global right ventricle has normal systolic function.The right  ?ventricular size is normal. No increase in right ventricular wall  ?thickness.  ? 5. Left atrial size was mildly dilated.  ? 6. Right atrial size was mildly dilated.  ? 7. Mild mitral annular calcification.  ? 8. The mitral valve is normal in structure. Trivial mitral valve  ?regurgitation.  ? 9. The tricuspid valve  is normal in structure.  ?10. The tricuspid valve is normal in structure. Tricuspid valve  ?regurgitation is mild-moderate.  ?11. The aortic valve is normal in structure. Aortic valve regurgitation is  ?trivial.  ?12. The pulmonic valve was not well visualized. Pulmonic valve  ?regurgitation is not visualized.  ?13. Mildly elevated  pulmonary artery systolic pressure.  ?14. The tricuspid regurgitant velocity is 2.18 m/s, and with an assumed  ?right atrial pressure of 15 mmHg, the estimated right ventricular systolic  ?pressure is mildly elevated at 34.0 mmHg.  ?15. The inferior vena cava is dilated in size with <50% respiratory  ?variability, suggesting right atrial pressure of 15 mmHg.  ?16. There is exaggerated respiratory displacement of the interventricular  ?septum. This could represent contrictive physiology , but can also be seen  ?with increased work of breathing (e.g. COPD or asthma exacerbation).  ?17. Compared to 03/09/2019, left ventricular wall motion and overall  ?systolic function have improved.  ? ?Patient Profile  ?BRANDILEE PIES is a 87 y.o. female with CAD, hypertension, paroxysmal atrial fibrillation, CKD stage III, stroke, rheumatoid arthritis/giant cell arteritis who was admitted on 08/05/2021 with dysphagia and pneumonia.  Cardiology was consulted for A-fib with RVR. ? ?Assessment & Plan  ? ?#Paroxysmal atrial fibrillation ?-Driven by pneumonia.  Converted to normal sinus rhythm on amiodarone.  Transition to oral amiodarone today.  200 mg twice daily for 7 days and then 200 mg daily.  Would recommend rhythm control strategy while she is being treated for pneumonia. ?-Stop beta-blocker in the setting of bradycardia.  While on amiodarone can hold her beta-blocker. ?-On Eliquis 5 mg twice daily. ? ?#Sinus bradycardia ?-No symptoms.  Stop AV nodal agents while on amiodarone. ? ?#Hypertension ?-Restart home losartan 25 mg daily. ?-Discontinue metoprolol. ?-Restart home Lasix at the discretion of hospital medicine.  She is euvolemic. ? ?#CAD ?-No angina.  Continue statin. ? ?[facility] will sign off.   ?Medication Recommendations: As above ?Other recommendations (labs, testing, etc): None. ?Follow up as an outpatient: We will arrange outpatient follow-up in 4 to 6 weeks with Dr. Angelena Form. ? ?For questions or updates, please  contact [place] ?Please consult [email or web address] for contact info under  ?   ?Signed, ?[place] T. Audie Box, MD, [facility] ?[place]  ?08/09/2021 8:04 AM  ? ?

## 2021-08-10 LAB — CBC
HCT: 28.3 % — ABNORMAL LOW (ref 36.0–46.0)
Hemoglobin: 8.7 g/dL — ABNORMAL LOW (ref 12.0–15.0)
MCH: 29.1 pg (ref 26.0–34.0)
MCHC: 30.7 g/dL (ref 30.0–36.0)
MCV: 94.6 fL (ref 80.0–100.0)
Platelets: 213 10*3/uL (ref 150–400)
RBC: 2.99 MIL/uL — ABNORMAL LOW (ref 3.87–5.11)
RDW: 14.4 % (ref 11.5–15.5)
WBC: 9.5 10*3/uL (ref 4.0–10.5)
nRBC: 0 % (ref 0.0–0.2)

## 2021-08-10 LAB — BASIC METABOLIC PANEL
Anion gap: 8 (ref 5–15)
BUN: 13 mg/dL (ref 8–23)
CO2: 24 mmol/L (ref 22–32)
Calcium: 7.7 mg/dL — ABNORMAL LOW (ref 8.9–10.3)
Chloride: 109 mmol/L (ref 98–111)
Creatinine, Ser: 1.03 mg/dL — ABNORMAL HIGH (ref 0.44–1.00)
GFR, Estimated: 53 mL/min — ABNORMAL LOW (ref >60)
Glucose, Bld: 88 mg/dL (ref 70–99)
Potassium: 2.8 mmol/L — ABNORMAL LOW (ref 3.5–5.1)
Sodium: 141 mmol/L (ref 135–145)

## 2021-08-10 LAB — MAGNESIUM: Magnesium: 2.1 mg/dL (ref 1.7–2.4)

## 2021-08-10 MED ORDER — POTASSIUM CHLORIDE CRYS ER 20 MEQ PO TBCR
40.0000 meq | EXTENDED_RELEASE_TABLET | ORAL | Status: AC
  Administered 2021-08-10 (×2): 40 meq via ORAL
  Filled 2021-08-10 (×2): qty 2

## 2021-08-10 MED ORDER — PANTOPRAZOLE SODIUM 40 MG PO TBEC
40.0000 mg | DELAYED_RELEASE_TABLET | Freq: Two times a day (BID) | ORAL | Status: DC
Start: 2021-08-10 — End: 2021-08-11
  Administered 2021-08-10 – 2021-08-11 (×3): 40 mg via ORAL
  Filled 2021-08-10 (×3): qty 1

## 2021-08-10 MED ORDER — HYDROCODONE-ACETAMINOPHEN 5-325 MG PO TABS: 1.0000 | ORAL_TABLET | Freq: Two times a day (BID) | ORAL | 0 refills | Status: DC | PRN

## 2021-08-10 MED ORDER — QUETIAPINE FUMARATE 25 MG PO TABS
25.0000 mg | ORAL_TABLET | Freq: Every day | ORAL | Status: DC
  Administered 2021-08-10: 25 mg via ORAL
  Filled 2021-08-10: qty 1

## 2021-08-10 MED ORDER — HYDROXYZINE HCL 10 MG PO TABS
10.0000 mg | ORAL_TABLET | Freq: Once | ORAL | Status: AC | PRN
  Administered 2021-08-10: 10 mg via ORAL
  Filled 2021-08-10: qty 1

## 2021-08-10 MED ORDER — SACCHAROMYCES BOULARDII 250 MG PO CAPS: 250.0000 mg | ORAL_CAPSULE | Freq: Two times a day (BID) | ORAL | 0 refills | Status: AC

## 2021-08-10 MED ORDER — FENTANYL 37.5 MCG/HR TD PT72: 1.0000 | MEDICATED_PATCH | TRANSDERMAL | 0 refills | Status: DC

## 2021-08-10 MED ORDER — IPRATROPIUM-ALBUTEROL 0.5-2.5 (3) MG/3ML IN SOLN
3.0000 mL | RESPIRATORY_TRACT | Status: AC | PRN
Start: 2021-08-10 — End: ?

## 2021-08-10 MED ORDER — POTASSIUM CHLORIDE CRYS ER 20 MEQ PO TBCR
40.0000 meq | EXTENDED_RELEASE_TABLET | Freq: Once | ORAL | Status: AC
Start: 2021-08-10 — End: 2021-08-10
  Administered 2021-08-10: 40 meq via ORAL
  Filled 2021-08-10: qty 2

## 2021-08-10 NOTE — Progress Notes (Signed)
? ?[facility] ?PROGRESS NOTE ? ? ?Betty Jackson CVE:[id number] DOB: 19-Aug-1934 DOA: 08/05/2021 ? ?PCP: Alvester Morin, MD ? ?Brief History/Interval Summary: 87 y.o. female with history h/o CVA, paroxysmal atrial fibrillation on Eliquis, rheumatoid arthritis/giant cell arteritis on methotrexate/chronic prednisone, CKD stage IIIa, CAD, GERD, hyperlipidemia, depression/anxiety, osteoporosis, chronic back pain on fentanyl patch, chronic hypoxic respiratory failure on baseline 2 L O2 presented from SNF with complaints of dysphagia.  Patient has a history of dysphagia and has had esophageal dilatations by GI in the past.  However this episode appeared to be more of a sudden onset.  Patient was hospitalized for further management.    ? ? ?Consultants: Gastroenterology ? ?Procedures:   ? ?EGD 5/9 ?Impression:               - Severe esophagitis with bleeding. ?                          - Gastritis. ?                          - Normal examined duodenum. ?                          - No specimens collected. ?                          This is more severe reflux than is expected from  ?                          GERD, and medication-induced esophagitis is  ?                          suspected (alendronate). ? ? ? ? ?Subjective/Interval History: ?Patient mentions that she could not sleep well overnight.  Otherwise denies any other complaints.  No chest pain or shortness of breath.   ? ? ? ?Assessment/Plan: ? ?Dysphagia ?EGD was done on 5/9 and shows severe esophagitis.  Evidence for gastritis also noted.  Medication induced esophagitis is suspected.  Patient was on alendronate prior to admission.  This has been held.   ?Plan is to repeat upper endoscopy once her cardiac status is more stable.  Per gastroenterology notes this may need to wait several weeks.   ?Patient has been stable from a GI standpoint.  Tolerating her diet.  Protonix can be changed to oral.  No overt GI bleeding noted.   ? ?Atrial fibrillation with  RVR ?Patient with known history of paroxysmal atrial fibrillation.  Presented with GI complaints as discussed above.  Went into rapid atrial fibrillation on 5/10.  Despite diltiazem infusion and parenteral doses of metoprolol patient's heart rate did not improve.  Cardiology was subsequently consulted and patient was started on amiodarone infusion ?Changed over to oral amiodarone.   ?Due to bradycardia metoprolol has been discontinued.  Cardizem CD is also currently on hold. ?Eliquis was initially held for EGD.  Resumed on 5/13 by cardiology.  Hemoglobin noted to be stable.  Recheck again tomorrow.   ? ?Acute respiratory failure with hypoxia/aspiration pneumonia ?Chest x-ray showed bibasilar infiltrates versus atelectasis. ?WBC was noted to be elevated.  She was noted to be febrile. ?Patient has improving.  Respiratory status is stable.  Should be able to wean her off of  oxygen. ?Unasyn was changed over to Augmentin.  She will complete a 7-day course. ? ?Normocytic anemia ?Drop in hemoglobin noted from 11.5 to 8.8.  No overt bleeding noted.  Hemoglobin stable for the most part.  Recheck tomorrow.  No overt bleeding noted. ? ?History of chronic diastolic CHF ?Was given IV fluids for hypovolemia.  Volume status is stable.   ?Last echocardiogram from 2021 showed normal systolic function. ? ?Chronic kidney disease stage IIIb/mild hyperkalemia ?Renal function close to baseline.   ?Lokelma was given for elevated potassium level.  Improved.   ?Subsequently became hypokalemic.  To aggressively replete potassium today.  Recheck labs tomorrow.  Magnesium 2.1.   ?Monitor urine output. ? ?Chronic back pain ?Noted to be on fentanyl patch. ? ?Coronary artery disease ?Holding aspirin and anticoagulants.  Chest pain was most likely due to tachyarrhythmia.  Appears to have improved once heart rate got better controlled. ? ?History of rheumatoid arthritis/giant cell arteritis ?On prednisone chronically.  Since she is having  difficulty swallowing tablets she was placed on hydrocortisone.  Switched back to prednisone and will be tapered down to her usual home dose.  Also on methotrexate as an outpatient which is currently on hold. ? ?Osteoporosis ?Holding alendronate due to concern for medication induced esophagitis. ? ?DVT Prophylaxis: Currently on hold.  SCDs ?Code Status: DNR ?Family Communication: Discussed with patient.  No family at bedside.  Her daughter was updated yesterday. ?Disposition Plan: From skilled nursing facility.  Anticipate discharge back to SNF tomorrow. ? ?Status is: Inpatient ?Remains inpatient appropriate because: Atrial fibrillation, dysphagia ? ? ? ? ? ?Medications: Scheduled: ? amiodarone  200 mg Oral BID  ? Followed by  ? [START ON 08/16/2021] amiodarone  200 mg Oral Daily  ? amoxicillin-clavulanate  1 tablet Oral Q12H  ? apixaban  5 mg Oral BID  ? atorvastatin  80 mg Oral Daily  ? feeding supplement  1 Container Oral TID BM  ? fentaNYL  1 patch Transdermal Q72H  ? fentaNYL  1 patch Transdermal Q72H  ? losartan  25 mg Oral Daily  ? pantoprazole (PROTONIX) IV  40 mg Intravenous Q12H  ? potassium chloride  40 mEq Oral Q4H  ? potassium chloride  40 mEq Oral Once  ? predniSONE  40 mg Oral Q breakfast  ? Followed by  ? [START ON 08/13/2021] predniSONE  20 mg Oral Q breakfast  ? Followed by  ? [START ON 08/16/2021] predniSONE  10 mg Oral Q breakfast  ? QUEtiapine  25 mg Oral QHS  ? saccharomyces boulardii  250 mg Oral BID  ? sucralfate  1 g Oral TID PC,HS,0200  ? ?Continuous: ? ? ?EYC:[id number] **OR** acetaminophen, alum & mag hydroxide-simeth **AND** [COMPLETED] lidocaine, hydrALAZINE, ipratropium-albuterol, metoprolol tartrate, nitroGLYCERIN, ondansetron (ZOFRAN) IV ? ?Antibiotics: ?Anti-infectives (From admission, onward)  ? ? Start     Dose/Rate Route Frequency Ordered Stop  ? 08/09/21 1215  amoxicillin-clavulanate (AUGMENTIN) 875-125 MG per tablet 1 tablet       ? 1 tablet Oral Every 12 hours 08/09/21 1123  08/12/21 0959  ? 08/05/21 1830  ampicillin-sulbactam (UNASYN) 1.5 g in sodium chloride 0.9 % 100 mL IVPB  Status:  Discontinued       ? 1.5 g ?200 mL/hr over 30 Minutes Intravenous Every 6 hours 08/05/21 1757 08/09/21 1123  ? ?  ? ? ?Objective: ? ?Vital Signs ? ?Vitals:  ? 08/09/21 0837 08/09/21 1932 08/09/21 1950 08/10/21 0411  ?BP: (!) 180/65 (!) 194/78 (!) 159/64 (!) 158/54  ?  Pulse: 62 (!) 57  (!) 58  ?Resp: '20 15  16  '$ ?Temp: 98.7 ?F (37.1 ?C) 98.6 ?F (37 ?C)  98.4 ?F (36.9 ?C)  ?TempSrc: Oral Oral  Oral  ?SpO2: 95% 100%  98%  ?Weight:    82.4 kg  ?Height:      ? ? ?Intake/Output Summary (Last 24 hours) at 08/10/2021 1100 ?Last data filed at 08/10/2021 0835 ?Gross per 24 hour  ?Intake 480 ml  ?Output 700 ml  ?Net -220 ml  ? ? ?Filed Weights  ? 08/07/21 0400 08/08/21 0400 08/10/21 0411  ?Weight: 76.4 kg 78.6 kg 82.4 kg  ? ? ?General appearance: Awake alert.  In no distress ?Resp: Clear to auscultation bilaterally.  Normal effort ?Cardio: S1-S2 is normal regular.  No S3-S4.  No rubs murmurs or bruit ?GI: Abdomen is soft.  Nontender nondistended.  Bowel sounds are present normal.  No masses organomegaly ?Extremities: No edema.  Full range of motion of lower extremities. ?Neurologic: Alert and oriented x3.  No focal neurological deficits.  ? ? ? ?Lab Results: ? ?Data Reviewed: I have personally reviewed following labs and reports of the imaging studies ? ?CBC: ?Recent Labs  ?Lab 08/06/21 ?3295 08/06/21 ?1952 08/08/21 ?1884 08/08/21 ?1541 08/08/21 ?2017 08/09/21 ?0149 08/09/21 ?1103 08/10/21 ?0354  ?WBC 12.4* 12.6* 9.3  --   --  8.2  --  9.5  ?HGB 10.9* 11.5* 8.8* 9.8* 9.2* 9.1* 9.2* 8.7*  ?HCT 35.5* 36.8 28.8* 31.9* 29.5* 29.1* 28.7* 28.3*  ?MCV 97.8 97.4 95.7  --   --  94.8  --  94.6  ?PLT 147* 193 185  --   --  180  --  213  ? ? ? ?Basic Metabolic Panel: ?Recent Labs  ?Lab 08/06/21 ?1952 08/07/21 ?0219 08/08/21 ?1660 08/09/21 ?0149 08/10/21 ?0354  ?NA 145 145 141 138 141  ?K 4.4 4.6 3.6 3.2* 2.8*  ?CL 109 113* 106  106 109  ?CO2 '24 23 26 24 24  '$ ?GLUCOSE 151* 113* 111* 107* 88  ?BUN '22 20 23 20 13  '$ ?CREATININE 1.04* 0.96 1.12* 1.20* 1.03*  ?CALCIUM 8.0* 7.9* 7.7* 7.7* 7.7*  ?MG  --   --   --   --  2.1  ? ? ? ?GFR: ?Estimated C

## 2021-08-10 NOTE — Progress Notes (Signed)
Mobility Specialist Progress Note  ? ? 08/10/21 1309  ?Mobility  ?Activity Transferred from chair to bed  ?Level of Assistance +2 (takes two people)  ?Assistive Device Front wheel walker  ?Distance Ambulated (ft) 2 ft  ?Activity Response Tolerated fair  ?$Mobility charge 1 Mobility  ? ?Pt received and agreeable. C/o sore fanny and fatigue. Pt with trunk flexion and needed cueing for steps. Left in bed with call bell in reach.  ? ?Hildred Alamin ?Mobility Specialist  ?Primary: 5N M.S. Phone: [phone] ?Secondary: 6[address] M.S. Phone: [phone] ?  ?

## 2021-08-10 NOTE — Plan of Care (Signed)
?  Problem: Education: ?Goal: Knowledge of General Education information will improve ?Description: Including pain rating scale, medication(s)/side effects and non-pharmacologic comfort measures ?Outcome: Progressing ?  ?Problem: Clinical Measurements: ?Goal: Ability to maintain clinical measurements within normal limits will improve ?Outcome: Progressing ?  ?Problem: Clinical Measurements: ?Goal: Respiratory complications will improve ?Outcome: Progressing ?  ?Problem: Clinical Measurements: ?Goal: Cardiovascular complication will be avoided ?Outcome: Progressing ?  ?Problem: Activity: ?Goal: Risk for activity intolerance will decrease ?Outcome: Progressing ?  ?Problem: Nutrition: ?Goal: Adequate nutrition will be maintained ?Outcome: Progressing ?  ?Problem: Pain Managment: ?Goal: General experience of comfort will improve ?Outcome: Progressing ?  ?Problem: Safety: ?Goal: Ability to remain free from injury will improve ?Outcome: Progressing ?  ?Problem: Skin Integrity: ?Goal: Risk for impaired skin integrity will decrease ?Outcome: Progressing ?  ?

## 2021-08-11 LAB — CBC
HCT: 28.2 % — ABNORMAL LOW (ref 36.0–46.0)
Hemoglobin: 8.8 g/dL — ABNORMAL LOW (ref 12.0–15.0)
MCH: 29.3 pg (ref 26.0–34.0)
MCHC: 31.2 g/dL (ref 30.0–36.0)
MCV: 94 fL (ref 80.0–100.0)
Platelets: 207 10*3/uL (ref 150–400)
RBC: 3 MIL/uL — ABNORMAL LOW (ref 3.87–5.11)
RDW: 14.5 % (ref 11.5–15.5)
WBC: 8.8 10*3/uL (ref 4.0–10.5)
nRBC: 0 % (ref 0.0–0.2)

## 2021-08-11 LAB — CULTURE, BLOOD (ROUTINE X 2)
Culture: NO GROWTH
Culture: NO GROWTH

## 2021-08-11 LAB — BASIC METABOLIC PANEL
Anion gap: 5 (ref 5–15)
BUN: 10 mg/dL (ref 8–23)
CO2: 25 mmol/L (ref 22–32)
Calcium: 7.8 mg/dL — ABNORMAL LOW (ref 8.9–10.3)
Chloride: 111 mmol/L (ref 98–111)
Creatinine, Ser: 1 mg/dL (ref 0.44–1.00)
GFR, Estimated: 55 mL/min — ABNORMAL LOW (ref >60)
Glucose, Bld: 100 mg/dL — ABNORMAL HIGH (ref 70–99)
Potassium: 3.8 mmol/L (ref 3.5–5.1)
Sodium: 141 mmol/L (ref 135–145)

## 2021-08-11 LAB — MAGNESIUM: Magnesium: 2.2 mg/dL (ref 1.7–2.4)

## 2021-08-11 MED ORDER — SUCRALFATE 1 GM/10ML PO SUSP: 1.0000 g | Freq: Three times a day (TID) | ORAL | 0 refills | Status: DC

## 2021-08-11 MED ORDER — PANTOPRAZOLE SODIUM 40 MG PO TBEC: 40.0000 mg | DELAYED_RELEASE_TABLET | Freq: Two times a day (BID) | ORAL | Status: DC

## 2021-08-11 MED ORDER — AMIODARONE HCL 200 MG PO TABS
ORAL_TABLET | ORAL | Status: DC
Start: 2021-08-11 — End: 2022-05-28

## 2021-08-11 MED ORDER — PREDNISONE 10 MG PO TABS: 10.0000 mg | ORAL_TABLET | Freq: Every day | ORAL | 0 refills | Status: AC

## 2021-08-11 MED ORDER — PREDNISONE 10 MG PO TABS
ORAL_TABLET | ORAL | Status: DC
Start: 2021-08-11 — End: 2021-08-26

## 2021-08-11 NOTE — Progress Notes (Signed)
Report given to Dalene Seltzer, Therapist, sports at [facility]. ?

## 2021-08-11 NOTE — Discharge Summary (Signed)
?[facility] ? ?Physician Discharge Summary  ? ?Patient ID: ?Betty Jackson ?MRN: [id number] ?DOB/AGE: [birth year] 85 y.o. ? ?Admit date: 08/05/2021 ?Discharge date:   08/11/2021 ? ? ?PCP: Alvester Morin, MD ? ?DISCHARGE DIAGNOSES:  ?Esophageal dysphagia secondary to esophagitis ?Atrial fibrillation, paroxysmal ?Acute respiratory failure with hypoxia, resolved ?Aspiration pneumonia, improved ?Normocytic anemia ?Chronic diastolic CHF ?Chronic kidney disease stage IIIb ?Chronic back pain on narcotics ?Coronary artery disease ?History of rheumatoid arthritis/giant cell arteritis ?Osteoporosis ? ?RECOMMENDATIONS FOR OUTPATIENT FOLLOW UP: ?Please note that alendronate has been discontinued for now ?Cardiology and gastroenterology to follow-up in the outpatient setting ? ? ?Home Health: Going to SNF ?Equipment/Devices: None ? ?CODE STATUS: DNR ? ?DISCHARGE CONDITION: fair ? ?Diet recommendation: Soft diet ? ?INITIAL HISTORY: ? 87 y.o. female with history h/o CVA, paroxysmal atrial fibrillation on Eliquis, rheumatoid arthritis/giant cell arteritis on methotrexate/chronic prednisone, CKD stage IIIa, CAD, GERD, hyperlipidemia, depression/anxiety, osteoporosis, chronic back pain on fentanyl patch, chronic hypoxic respiratory failure on baseline 2 L O2 presented from SNF with complaints of dysphagia.  Patient has a history of dysphagia and has had esophageal dilatations by GI in the past.  However this episode appeared to be more of a sudden onset.  Patient was hospitalized for further management.    ?  ?  ?Consultants: Gastroenterology. Cardiology ?  ?Procedures:   ?  ?EGD 5/9 ?Impression:               - Severe esophagitis with bleeding. ?                          - Gastritis. ?                          - Normal examined duodenum. ?                          - No specimens collected. ?                          This is more severe reflux than is expected from  ?                          GERD, and medication-induced  esophagitis is  ?                          suspected (alendronate). ?  ?  ?HOSPITAL COURSE:  ? ?Dysphagia ?EGD was done on 5/9 and shows severe esophagitis.  Evidence for gastritis also noted.  Medication induced esophagitis is suspected.  Patient was on alendronate prior to admission.  This has been held.   ?Plan is to repeat upper endoscopy once her cardiac status is more stable.  Per gastroenterology notes this may need to wait several weeks.  They will see her in their office on May 30.  To be seen by Dr. Loletha Carrow. ?Patient has been stable from a GI standpoint.  Tolerating her diet.  Continue PPI twice daily.  No overt GI bleeding noted.   ? ?Atrial fibrillation with RVR ?Patient with known history of paroxysmal atrial fibrillation.  Presented with GI complaints as discussed above.  Went into rapid atrial fibrillation on 5/10.  Despite diltiazem infusion and parenteral doses of metoprolol patient's heart rate did not improve.  Cardiology was subsequently consulted  and patient was started on amiodarone infusion ?Changed over to oral amiodarone.   ?Due to bradycardia metoprolol has been discontinued.  Cardizem CD is also currently on hold. ?Eliquis was initially held for EGD.  Resumed on 5/13 by cardiology.  Hemoglobin noted to be stable.   ?  ?Acute respiratory failure with hypoxia/aspiration pneumonia ?Chest x-ray showed bibasilar infiltrates versus atelectasis. ?WBC was noted to be elevated.  She was noted to be febrile. ?Patient has improving.  Respiratory status is stable.  She has been weaned off of oxygen. ?Unasyn was changed over to Augmentin.  She will complete a 7-day course. ?  ?Normocytic anemia ?Drop in hemoglobin noted from 11.5 to 8.8.  No overt bleeding noted.  Hemoglobin stable for the most part.   ?  ?History of chronic diastolic CHF ?Was given IV fluids for hypovolemia.  Volume status is stable.   ?Last echocardiogram from 2021 showed normal systolic function. ? ?Chronic kidney disease stage  IIIb/electrolyte imbalance/hypokalemia ?Renal function close to baseline.   ?Lokelma was given for elevated potassium level.  Improved.   ?Subsequently became hypokalemic.  Potassium aggressively repleted.  Noted to be 3.8 today.  Magnesium 2.2.   ? ?Chronic back pain ?Noted to be on fentanyl patch. ?  ?Coronary artery disease ?Holding aspirin and anticoagulants.  Chest pain was most likely due to tachyarrhythmia.  Appears to have improved once heart rate got better controlled. ?  ?History of rheumatoid arthritis/giant cell arteritis ?On prednisone chronically.   ?  ?Osteoporosis ?Holding alendronate due to concern for medication induced esophagitis. ? ?Obesity ?Estimated body mass index is 30.26 kg/m? as calculated from the following: ?  Height as of this encounter: 5' 4.49" (1.638 m). ?  Weight as of this encounter: 81.2 kg. ? ?Patient is stable.  Okay for discharge to skilled nursing facility. ? ? ?PERTINENT LABS: ? ?The results of significant diagnostics from this hospitalization (including imaging, microbiology, ancillary and laboratory) are listed below for reference.   ? ?Microbiology: ?Recent Results (from the past 240 hour(s))  ?Culture, blood (Routine X 2) w Reflex to ID Panel     Status: None  ? Collection Time: 08/06/21  8:00 PM  ? Specimen: BLOOD LEFT HAND  ?Result Value Ref Range Status  ? Specimen Description BLOOD LEFT HAND  Final  ? Special Requests   Final  ?  BOTTLES DRAWN AEROBIC AND ANAEROBIC Blood Culture results may not be optimal due to an inadequate volume of blood received in culture bottles  ? Culture   Final  ?  NO GROWTH 5 DAYS ?Performed at [facility], [place] [address], [place], [place] [address] ?  ? Report Status 08/11/2021 FINAL  Final  ?Culture, blood (Routine X 2) w Reflex to ID Panel     Status: None  ? Collection Time: 08/06/21  8:01 PM  ? Specimen: BLOOD RIGHT HAND  ?Result Value Ref Range Status  ? Specimen Description BLOOD RIGHT HAND  Final  ? Special Requests   Final   ?  BOTTLES DRAWN AEROBIC AND ANAEROBIC Blood Culture results may not be optimal due to an inadequate volume of blood received in culture bottles  ? Culture   Final  ?  NO GROWTH 5 DAYS ?Performed at [facility], [place] [address] [place], [place] [address] ?  ? Report Status 08/11/2021 FINAL  Final  ?Urine Culture     Status: Abnormal  ? Collection Time: 08/06/21  8:32 PM  ? Specimen: Urine, Clean  Catch  ?Result Value Ref Range Status  ? Specimen Description URINE, CLEAN CATCH  Final  ? Special Requests   Final  ?  NONE ?Performed at [facility], [place] [address], [place], [place] [address] ?  ? Culture 10,000 COLONIES/mL ESCHERICHIA COLI (A)  Final  ? Report Status 08/09/2021 FINAL  Final  ? Organism ID, Bacteria ESCHERICHIA COLI (A)  Final  ?    Susceptibility  ? Escherichia coli - MIC*  ?  AMPICILLIN >=32 RESISTANT Resistant   ?  CEFAZOLIN <=4 SENSITIVE Sensitive   ?  CEFEPIME <=0.12 SENSITIVE Sensitive   ?  CEFTRIAXONE <=0.25 SENSITIVE Sensitive   ?  CIPROFLOXACIN <=0.25 SENSITIVE Sensitive   ?  GENTAMICIN <=1 SENSITIVE Sensitive   ?  IMIPENEM <=0.25 SENSITIVE Sensitive   ?  NITROFURANTOIN <=16 SENSITIVE Sensitive   ?  TRIMETH/SULFA <=20 SENSITIVE Sensitive   ?  AMPICILLIN/SULBACTAM 16 INTERMEDIATE Intermediate   ?  PIP/TAZO <=4 SENSITIVE Sensitive   ?  * 10,000 COLONIES/mL ESCHERICHIA COLI  ?  ? ?Labs: ? ?COVID-19 Labs ? ? ?Lab Results  ?Component Value Date  ? [place] NEGATIVE 03/19/2021  ? [place] NEGATIVE 03/17/2021  ? [place] NEGATIVE 03/14/2021  ? [place] NEGATIVE 07/14/2020  ? ? ? ? ?Basic Metabolic Panel: ?Recent Labs  ?Lab 08/07/21 ?0219 08/08/21 ?3794 08/09/21 ?0149 08/10/21 ?0354 08/11/21 ?3276  ?NA 145 141 138 141 141  ?K 4.6 3.6 3.2* 2.8* 3.8  ?CL 113* 106 106 109 111  ?CO2 '23 26 24 24 25  '$ ?GLUCOSE 113* 111* 107* 88 100*  ?BUN '20 23 20 13 10  '$ ?CREATININE 0.96 1.12* 1.20* 1.03* 1.00  ?CALCIUM 7.9* 7.7* 7.7* 7.7* 7.8*  ?MG  --   --   --  2.1 2.2  ? ?Liver Function  Tests: ?Recent Labs  ?Lab 08/05/21 ?1029  ?AST 27  ?ALT 21  ?ALKPHOS 67  ?BILITOT 0.6  ?PROT 5.9*  ?ALBUMIN 3.2*  ? ?Recent Labs  ?Lab 08/05/21 ?1029  ?LIPASE 33  ? ?Recent Labs  ?Lab 08/06/21 ?1657  ?AMMONIA 19  ? ?CBC

## 2021-08-11 NOTE — Care Management Important Message (Signed)
Important Message ? ?Patient Details  ?Name: Betty Jackson ?MRN: [id number] ?Date of Birth: [birth year] ? ? ?Medicare Important Message Given:  Yes ? ? ? ? ?Shelda Altes ?08/11/2021, 12:38 PM ?

## 2021-08-11 NOTE — TOC Transition Note (Addendum)
Transition of Care (TOC) - CM/SW Discharge Note ? ? ?Patient Details  ?Name: JUNIPER COBEY ?MRN: [id number] ?Date of Birth: 1934-04-12 ? ?Transition of Care (TOC) CM/SW Contact:  ?[place], LCSW ?Phone Number: ?08/11/2021, 11:36 AM ? ? ?Clinical Narrative:    ? ?Patient will DC to: [facility] ?Anticipated DC date: 08/11/21 ?Family notified: Both Daughters ?Transport by: Daughter Melanie ? ? ?Per MD patient ready for DC to [facility]. RN, patient, patient's family, and facility notified of DC. Discharge Summary and FL2 sent to facility. RN to call report prior to discharge ([phone](KVQ for [facility] Nurse)). DC packet on chart. Daughter Threasa Beards will be transporting pt in private vehicle.   ? ?CSW will sign off for now as social work intervention is no longer needed. Please consult [place] again if new needs arise.  ? ?Final next level of care: [place] ?Barriers to Discharge: No Barriers Identified ? ? ?Patient Goals and CMS Choice ?Patient states their goals for this hospitalization and ongoing recovery are:: Pt unable to participate in goal setting at this time. ?CMS Medicare.gov Compare Post Acute Care list provided to:: Patient Represenative (must comment) (Daughter) ?Choice offered to / list presented to : Adult Children, Patient ? ?Discharge Placement ?  ?           ?Patient chooses bed at: [facility] ?Patient to be transferred to facility by: Daughter Melanie ?Name of family member notified: Both Daughters ?Patient and family notified of of transfer: 08/11/21 ? ?Discharge Plan and Services ?  ?  ?Post Acute Care Choice: [place]          ?  ?  ?  ?  ?  ?  ?  ?  ?  ?  ? ?Social Determinants of Health (SDOH) Interventions ?  ? ? ?Readmission Risk Interventions ? ?  10/30/2019  ?  9:48 AM 09/26/2019  ?  4:29 PM 06/21/2019  ?  4:12 PM  ?Readmission Risk Prevention Plan  ?Transportation Screening Complete Complete Complete  ?Medication Review Press photographer)  Complete Complete Complete  ?PCP or Specialist appointment within 3-5 days of discharge Complete Complete Not Complete  ?PCP/Specialist Appt Not Complete comments   likely SNF placement  ?[place] or Home Care Consult Complete Complete Not Complete  ?[place] or Home Care Consult Pt Refusal Comments   likely SNF  ?SW Recovery Care/Counseling Consult Complete Complete Complete  ?Palliative Care Screening Complete Complete Complete  ?Skilled Nursing Facility Not Applicable Complete Complete  ? ? ? ? ? ?

## 2021-08-12 NOTE — TOC Progression Note (Signed)
Transition of Care (TOC) - Progression Note  ? ? ?Patient Details  ?Name: Betty Jackson ?MRN: [id number] ?Date of Birth: June 17, 1934 ? ?Transition of Care (TOC) CM/SW Contact  ?[place], LCSW ?Phone Number: ?08/12/2021, 11:09 AM ? ?Clinical Narrative:    ? ?CSW received voicemail from [facility] stating that peer to peer is offered for SNF auth. CSW provided peer to peer contact info to MD.  ? ?Dr. [place] Pink completed peer to peer and stated that Dr. Lacinda Axon (with insurance company) "declined need for SNF based on PT notes and our conversation. He however felt that she could get intermittent PT/OT through medicare part B." ? ?CSW notified Cheri from [facility].  ? ?Expected Discharge Plan: [place] ?Barriers to Discharge: No Barriers Identified ? ?Expected Discharge Plan and Services ?Expected Discharge Plan: [place] ?  ?  ?Post Acute Care Choice: [place] ?Living arrangements for the past 2 months: [place] ?Expected Discharge Date: 08/11/21               ?  ?  ?  ?  ?  ?  ?  ?  ?  ?  ? ? ?Social Determinants of Health (SDOH) Interventions ?  ? ?Readmission Risk Interventions ? ?  10/30/2019  ?  9:48 AM 09/26/2019  ?  4:29 PM 06/21/2019  ?  4:12 PM  ?Readmission Risk Prevention Plan  ?Transportation Screening Complete Complete Complete  ?Medication Review Press photographer) Complete Complete Complete  ?PCP or Specialist appointment within 3-5 days of discharge Complete Complete Not Complete  ?PCP/Specialist Appt Not Complete comments   likely SNF placement  ?[place] or Home Care Consult Complete Complete Not Complete  ?[place] or Home Care Consult Pt Refusal Comments   likely SNF  ?SW Recovery Care/Counseling Consult Complete Complete Complete  ?Palliative Care Screening Complete Complete Complete  ?Skilled Nursing Facility Not Applicable Complete Complete  ? ? ?

## 2021-08-21 ENCOUNTER — Encounter: Payer: Self-pay | Admitting: Gastroenterology

## 2021-08-26 ENCOUNTER — Encounter: Payer: Self-pay | Admitting: Gastroenterology

## 2021-08-26 ENCOUNTER — Ambulatory Visit (INDEPENDENT_AMBULATORY_CARE_PROVIDER_SITE_OTHER): Payer: Medicare Other | Admitting: Gastroenterology

## 2021-08-26 ENCOUNTER — Telehealth: Payer: Self-pay

## 2021-08-26 VITALS — BP 122/60 | HR 80 | Ht 64.5 in | Wt 172.0 lb

## 2021-08-26 DIAGNOSIS — D638 Anemia in other chronic diseases classified elsewhere: Secondary | ICD-10-CM

## 2021-08-26 DIAGNOSIS — K2210 Ulcer of esophagus without bleeding: Secondary | ICD-10-CM | POA: Diagnosis not present

## 2021-08-26 DIAGNOSIS — R1319 Other dysphagia: Secondary | ICD-10-CM

## 2021-08-26 DIAGNOSIS — Z7902 Long term (current) use of antithrombotics/antiplatelets: Secondary | ICD-10-CM | POA: Diagnosis not present

## 2021-08-26 NOTE — Progress Notes (Signed)
[facility] progress note:  History: Betty Jackson 08/26/2021  Referring provider: Alvester Morin, MD  Reason for consult/chief complaint: Dysphagia (Symptoms have improved, occasional swallowing issues)   Subjective  HPI: Betty Jackson follows up with her daughter today after recent hospitalization.  Clinical details in those consult and progress notes.  Briefly, she was admitted with rather cute onset dysphagia and odynophagia with cough shortness of breath and hypoxia.  EGD revealed very severe ulcerative esophagitis (with food impaction) that I suspected may have been due to alendronate (which was discontinued at discharge).  It was also suspected to be a superimposed component of reflux.  She was put on maximal acid suppression, Carafate and viscous lidocaine (the latter later discontinued).  It was suspected that she may have had some aspiration pneumonia with the hypoxia and bilateral basilar infiltrates.  She was weaned off oxygen by the time of discharge (patient follow through hospital stay and discharge summary reviewed).  She had A-fib with RVR causing additional dyspnea, then controlled with Cardizem drip and converted with amiodarone. Final cardiology progress note from that admission including the following: "87 y.o. female with history of PAF, PVCs, CAD, HTN, HLD and COPD admitted 08/05/21 with difficulty swallowing, cough and on 2L [place] desats to 85%.  (She had been placed on 02 at facility 2 days prior to this episode for hypoxia)  Similar episode last year and needed esophageal dilatation.   She underwent EGD and found to have severe esophagitis with contact bleeding and adherent clot.  No biopsy due to fragility of mucosa and eliquis. Chest x-ray with bibasilar infiltrates. She was also febrile and started on antibiotics.    Assessment & Plan    Atrial fib with RVR: She had RVR in setting of acute febrile illness. She converted to sinus on amiodarone. I would  continue amiodarone IV today to get a good loading dose.  Troponin elevation: Likely due to demand ischemia in setting of fever, atrial fib with RVR. No chest pain today in sinus. No ischemic evaluation is planned. "  ____________ Her anticoagulation was resumed upon discharge.  It should be noted that on admission she carried a diagnosis of COPD with chronic oxygen requirement, but this turned out to be inaccurate after conversation with patient and her family.   Betty Jackson was seen today with one of her daughters.  The med administration record from her long-term care facility was reviewed and confirm she is on pantoprazole twice daily as well as iron supplement, however her Carafate liquid was changed to tablet 4 times daily. She tells me her swallowing is much improved though she episodically feels as if something may be hung up in the chest or more in the throat.  She no longer has pain with swallowing, in fact that was much improved prior to hospital discharge.  Still on a soft diet, no nausea or vomiting.  They feel as if her legs are more swollen since hospital discharge, and she has an appointment later this week with cardiology (will copy my note).    ROS:   Denies chest pain Chronic cough and shortness of breath Arthralgias and chronic pain (wheelchair-bound) Difficulty sleeping Remainder of systems negative except as above  Past Medical History: Past Medical History:  Diagnosis Date   Acute respiratory failure with hypoxia ([place]) 05/20/2017   Anemia, unspecified 10/28/2012   Anxiety state, unspecified 10/28/2012   CAD (coronary artery disease)    a. Stent RCA in [facility];  b. Cath  approx 2009 - nonobs per pt report.   Cataract    immature on the left eye   Chronic insomnia 02/07/2013   Chronic lower back pain    scoliosis   CKD (chronic kidney disease) stage 3, GFR 30-59 ml/min (HCC)    CVA (cerebral infarction) 10/29/2012   DDD (degenerative disc disease)     Diverticulosis    GERD (gastroesophageal reflux disease) 09/01/2010   Hemorrhoids    Herniated nucleus pulposus, L5-S1, right 11/04/2015   Hyperlipidemia    takes Lipitor daily   Hypertension    takes Amlodipine,Losartan,Metoprolol,and HCTZ daily   Incontinence of urine    Insomnia    takes Restoril nightly   Lumbar stenosis 04/25/2013   Major depressive disorder, recurrent episode, moderate ([place]) 07/16/2013   Osteoporosis    PAF (paroxysmal atrial fibrillation) ([place]) 2011   a. lone epidode in 2011 according to notes.   Rheumatoid arthritis ([place])    Scoliosis    Sepsis ([place]) 05/2019   Stroke ([place])    Temporal arteritis ([place]) 2011   a. followed @ [place]; potential flareup 10/28/2012/notes 10/28/2012   Vocal cord dysfunction    "they don't operate properly" (10/28/2012)     Past Surgical History: Past Surgical History:  Procedure Laterality Date   ABDOMINAL HYSTERECTOMY  ~ 1984   vaginally   BACK SURGERY  7-44yr ago   X Stop   BLADDER SUSPENSION  2001   BREAST BIOPSY Right    CATARACT EXTRACTION W/ INTRAOCULAR LENS IMPLANT Right ~ 08/2012   CHEST TUBE INSERTION Left 03/08/2019   Procedure: Chest Tube Insertion;  Surgeon: VIvin Poot MD;  [place]  Service: Thoracic;  Laterality: Left;   COLONOSCOPY  01/26/2012   Procedure: COLONOSCOPY;  Surgeon: MLadene Artist MD,FACG;  [facility]  Service: Endoscopy;  Laterality: N/A;  note the EGD is possible   CORONARY ANGIOPLASTY WITH STENT PLACEMENT  2006   X 1 stent   EPIDURAL BLOCK INJECTION     ESOPHAGOGASTRODUODENOSCOPY  01/26/2012   Procedure: ESOPHAGOGASTRODUODENOSCOPY (EGD);  Surgeon: MLadene Artist MD,FACG;  [facility]  Service: Endoscopy;  Laterality: N/A;   ESOPHAGOGASTRODUODENOSCOPY (EGD) WITH PROPOFOL N/A 08/05/2021   Procedure: ESOPHAGOGASTRODUODENOSCOPY (EGD) WITH PROPOFOL;  Surgeon: DDoran Stabler MD;  [place]  Service: Gastroenterology;  Laterality: N/A;   FINE  NEEDLE ASPIRATION Right 09/26/2019   Procedure: FINE NEEDLE ASPIRATION;  Surgeon: HAltamese Park MD;  [place]  Service: Orthopedics;  Laterality: Right;   FOREIGN BODY REMOVAL  08/05/2021   Procedure: FOREIGN BODY REMOVAL;  Surgeon: DDoran Stabler MD;  [facility]  Service: Gastroenterology;;   HEMIARTHROPLASTY HIP Right 2012   IR EPIDUROGRAPHY  04/20/2019   LAPAROSCOPIC CHOLECYSTECTOMY  2001   LUMBAR FUSION  03/2013   LUMBAR LAMINECTOMY/DECOMPRESSION MICRODISCECTOMY Right 11/04/2015   Procedure: Right Lumbar Five-Sacral One Microdiskectomy;  Surgeon: HKristeen Miss MD;  [facility];  Service: Neurosurgery;  Laterality: Right;  Right L5-S1 Microdiskectomy   moles removed that required stiches     one on leg and one on face   ORIF FEMUR FRACTURE Right 09/26/2019   Procedure: OPEN REDUCTION INTERNAL FIXATION (ORIF) DISTAL FEMUR FRACTURE;  Surgeon: HAltamese Myrtle MD;  [place] [facility]  Service: Orthopedics;  Laterality: Right;   ORIF FEMUR FRACTURE Right 10/17/2019   Procedure: OPEN REDUCTION INTERNAL FIXATION (ORIF) DISTAL FEMUR FRACTURE;  Surgeon: HAltamese [place] MD;  [place] [facility] [place]  Service: Orthopedics;  Laterality: Right;  SUBXYPHOID PERICARDIAL WINDOW N/A 03/08/2019   Procedure: SUBXYPHOID PERICARDIAL WINDOW;  Surgeon: Ivin Poot, MD;  Location: No Name;  Service: Thoracic;  Laterality: N/A;   TEE WITHOUT CARDIOVERSION N/A 03/08/2019   Procedure: TRANSESOPHAGEAL ECHOCARDIOGRAM (TEE);  Surgeon: Prescott Gum, Collier Salina, MD;  Location[facility];  Service: Thoracic;  Laterality: N/A;   TEMPORAL ARTERY BIOPSY / LIGATION Bilateral 2011   TONSILLECTOMY AND ADENOIDECTOMY     at age 55   TUBAL LIGATION  ~ [place]  ~ 2010   "lower back" (10/28/2012)     Family History: Family History  Problem Relation Age of Onset   Brain cancer Mother 40   Heart attack Father    Breast cancer Daughter 55   Heart disease Other    Hypertension Other    Arthritis  Neg Hx    Colon cancer Neg Hx    Osteoporosis Neg Hx     Social History: Social History   Socioeconomic History   Marital status: Married    Spouse name: Not on file   Number of children: 3   Years of education: Not on file   Highest education level: Not on file  Occupational History   Occupation: Retired Cabin crew  Tobacco Use   Smoking status: Never   Smokeless tobacco: Never  Vaping Use   Vaping Use: Never used  Substance and Sexual Activity   Alcohol use: Yes    Comment: socially   Drug use: No   Sexual activity: Not on file  Other Topics Concern   Not on file  Social History Narrative   Lives at [facility] facility, [place]   Right Handed   Drinks 1-2 cups caffeine daily   Social Determinants of Health   Financial Resource Strain: Not on file  Food Insecurity: Not on file  Transportation Needs: Not on file  Physical Activity: Not on file  Stress: Not on file  Social Connections: Not on file    Allergies: Allergies  Allergen Reactions   Dextromethorphan Rash    Outpatient Meds: Current Outpatient Medications  Medication Sig Dispense Refill   acetaminophen (TYLENOL) 500 MG tablet Take 500 mg by mouth every 6 (six) hours as needed for mild pain.     allopurinol (ZYLOPRIM) 100 MG tablet Take 100 mg by mouth daily.     amiodarone (PACERONE) 200 MG tablet Take '200mg'$  twice daily for 5 days, then take '200mg'$  once daily.     atorvastatin (LIPITOR) 80 MG tablet Take 1 tablet (80 mg total) by mouth daily. (Patient taking differently: Take 80 mg by mouth at bedtime.) 30 tablet 3   cetirizine (ZYRTEC) 10 MG tablet Take 10 mg by mouth at bedtime.     cholecalciferol (VITAMIN D) 25 MCG tablet Take 2 tablets (2,000 Units total) by mouth 2 (two) times daily.     diltiazem (CARDIZEM CD) 120 MG 24 hr capsule Take 120 mg by mouth daily.     ELIQUIS 5 MG TABS tablet TAKE 1 TABLET BY MOUTH TWICE A DAY (Patient taking differently: Take 5 mg by mouth 2 (two) times daily.) 60  tablet 5   Ensure (ENSURE) Take 237 mLs by mouth 2 (two) times daily between meals.     escitalopram (LEXAPRO) 10 MG tablet TAKE 1 TABLET BY MOUTH EVERY DAY (Patient taking differently: Take 10 mg by mouth daily.) 90 tablet 1   fentaNYL 37.5 MCG/HR PT72 Place 1 patch onto the skin every 3 (three) days. 10 patch 0  ferrous sulfate 325 (65 FE) MG tablet Take 1 tablet (325 mg total) by mouth 2 (two) times daily with a meal. 60 tablet 3   furosemide (LASIX) 40 MG tablet Take 1 tablet (40 mg total) by mouth daily. Please keep upcoming Dr. appt for future refills. Thank you 30 tablet 0   HYDROcodone-acetaminophen (NORCO) 5-325 MG tablet Take 1 tablet by mouth every 12 (twelve) hours as needed for moderate pain. 15 tablet 0   ipratropium-albuterol (DUONEB) 0.5-2.5 (3) MG/3ML SOLN Take 3 mLs by nebulization every 4 (four) hours as needed. 360 mL    losartan (COZAAR) 25 MG tablet Take 1 tablet (25 mg total) by mouth daily.     Multiple Vitamin (MULTIVITAMIN WITH MINERALS) TABS tablet Take 1 tablet by mouth daily. 60 tablet 0   ondansetron (ZOFRAN) 4 MG tablet Take 4 mg by mouth every 8 (eight) hours as needed for nausea or vomiting.     pantoprazole (PROTONIX) 40 MG tablet Take 1 tablet (40 mg total) by mouth 2 (two) times daily.     potassium chloride (KLOR-CON) 10 MEQ tablet Take 1 tablet (10 mEq total) by mouth daily. 90 tablet 2   predniSONE (DELTASONE) 10 MG tablet Take 1 tablet (10 mg total) by mouth daily with breakfast. Resume after 3 days of '20mg'$  daily as instructed. 30 tablet 0   sucralfate (CARAFATE) 1 GM/10ML suspension Take 10 mLs (1 g total) by mouth 3 (three) times daily after meals, bedtime and 0200. 420 mL 0   sulfaSALAzine (AZULFIDINE) 500 MG EC tablet Take 500 mg by mouth daily.     QUEtiapine (SEROQUEL) 25 MG tablet Take 1 tablet (25 mg total) by mouth at bedtime. (Patient taking differently: Take 12.5 mg by mouth at bedtime.) 30 tablet 0   No current facility-administered medications  for this visit.      ___________________________________________________________________ Objective   Exam:  BP 122/60   Pulse 80   Ht 5' 4.5" (1.638 m)   Wt 172 lb (78 kg)   BMI 29.07 kg/m  Wt Readings from Last 3 Encounters:  08/26/21 172 lb (78 kg)  08/11/21 179 lb 0.2 oz (81.2 kg)  05/21/21 169 lb (76.7 kg)    General: Sitting up comfortably in wheelchair, alert and conversational, no acute distress.  She has a mildly raspy vocal quality as before (chronic vocal cord dysfunction) Eyes: sclera anicteric, no redness ENT: oral mucosa moist without lesions, no cervical or supraclavicular lymphadenopathy CV: Mostly regular with what feels like some occasional premature beats, 2+ pretibial edema bilaterally Resp: clear to auscultation bilaterally, normal RR and effort noted.  No stridor GI: soft, no tenderness, with active bowel sounds.  Limited exam in wheelchair. Skin, pale as before  Labs:     Latest Ref Rng & Units 08/11/2021    4:28 AM 08/10/2021    3:54 AM 08/09/2021   11:03 AM  CBC  WBC 4.0 - 10.5 K/uL 8.8   9.5     Hemoglobin 12.0 - 15.0 g/dL 8.8   8.7   9.2    Hematocrit 36.0 - 46.0 % 28.2   28.3   28.7    Platelets 150 - 400 K/uL 207   213         Latest Ref Rng & Units 08/11/2021    4:28 AM 08/10/2021    3:54 AM 08/09/2021    1:49 AM  CMP  Glucose 70 - 99 mg/dL 100   88   107    BUN  8 - 23 mg/dL '10   13   20    '$ Creatinine 0.44 - 1.00 mg/dL 1.00   1.03   1.20    Sodium 135 - 145 mmol/L 141   141   138    Potassium 3.5 - 5.1 mmol/L 3.8   2.8   3.2    Chloride 98 - 111 mmol/L 111   109   106    CO2 22 - 32 mmol/L '25   24   24    '$ Calcium 8.9 - 10.3 mg/dL 7.8   7.7   7.7       Assessment: Encounter Diagnoses  Name Primary?   Esophageal dysphagia Yes   Ulcerative esophagitis    Anemia, chronic disease    Long term (current) use of antithrombotics/antiplatelets     She was found to have very severe ulcerative and friable esophagitis during recent  inpatient EGD.  I suspect it was mostly medication induced (alendronate), which she has not resumed.  There is probably some superimposed reflux as well.  She is symptomatically improved on current therapy, and the Carafate tablets are no longer helping this since they are not being dissolved into a slurry.  That medicine can be discontinued, which I noted on her care facility paperwork today.  Pantoprazole should be continued at current dose of 40 mg twice daily, diet can be slowly advanced to regular consistency as tolerated. If she has recurrence of odynophagia after discontinuation of Carafate, then it should be resumed with 1 tablet dissolved in 2 tablespoons of water and taken as a slurry 4 times daily before meals and at bedtime.  Head of bed elevated at least 30 degrees all the time.  Do not resume alendronate.  Repeat upper endoscopy warranted to assess healing and be sure no underlying neoplasia.  This must be done in the hospital endoscopy lab due to her medical conditions, limited mobility and vocal cord dysfunction.  She was agreeable after discussion of procedure and risk, and her daughter was present for the entire visit.  The benefits and risks of the planned procedure were described in detail with the patient or (when appropriate) their health care proxy.  Risks were outlined as including, but not limited to, bleeding, infection, perforation, adverse medication reaction leading to cardiac or pulmonary decompensation, pancreatitis (if ERCP).  The limitation of incomplete mucosal visualization was also discussed.  No guarantees or warranties were given. Patient at increased risk for cardiopulmonary complications of procedure due to medical comorbidities. Next available date for hospital-based upper endoscopy on my schedule was July 17.  Last dose of Eliquis 24 hours prior to procedure, this will be communicated to her cardiologist for their approval but I expect that we will be  agreeable.  I asked her daughter to communicate with Bryttany's care facility to be sure they have been in regular lab work and obtaining appropriate consultation as needed for her chronic anemia.  It sounds like she was on iron for months prior to the hospital admission, though the details of that work-up are unknown.   33 minutes were spent on this encounter (including chart review, history/exam, counseling/coordination of care, and documentation) > 50% of that time was spent on counseling and coordination of care.   [name]  CC: Referring provider noted above

## 2021-08-26 NOTE — Patient Instructions (Addendum)
If you are age 85 or older, your body mass index should be between 23-30. Your Body mass index is 29.07 kg/m. If this is out of the aforementioned range listed, please consider follow up with your Primary Care Provider.  If you are age 85 or younger, your body mass index should be between 19-25. Your Body mass index is 29.07 kg/m. If this is out of the aformentioned range listed, please consider follow up with your Primary Care Provider.   ________________________________________________________  The [facility] providers would like to encourage you to use [facility] to communicate with providers for non-urgent requests or questions.  Due to long hold times on the telephone, sending your provider a message by [facility] may be a faster and more efficient way to get a response.  Please allow 48 business hours for a response.  Please remember that this is for non-urgent requests.  _______________________________________________________  Dennis Bast have been scheduled for an endoscopy. Please follow written instructions given to you at your visit today. If you use inhalers (even only as needed), please bring them with you on the day of your procedure.  You will be contacted by our office prior to your procedure for directions on holding your Plavix.  If you do not hear from our office 1 week prior to your scheduled procedure, please call [phone] to discuss.   It was a pleasure to see you today!  Thank you for trusting me with your gastrointestinal care!

## 2021-08-26 NOTE — Telephone Encounter (Signed)
[facility] Pre-operative Risk Assessment     Request for surgical clearance:     Endoscopy Procedure  What type of surgery is being performed?     EGD  When is this surgery scheduled?     10-06-2021  What type of clearance is required ?   Pharmacy  Are there any medications that need to be held prior to surgery and how long? Yes, Plavix, 5 days   Practice name and name of physician performing surgery?      [facility]  What is your office phone and fax number?      Phone- [phone]  [phone]  Anesthesia type (None, local, MAC, general) ?       MAC

## 2021-08-26 NOTE — Telephone Encounter (Signed)
Patient with diagnosis of A Fib on Eliquis for anticoagulation.    Procedure: EGD Date of procedure: 10/06/21  CHA2DS2-VASc Score = 8  This indicates a 10.8% annual risk of stroke. The patient's score is based upon: CHF History: 1 HTN History: 1 Diabetes History: 0 Stroke History: 2 Vascular Disease History: 1 Age Score: 2 Gender Score: 1   CrCl 60 mL/min Platelet count 207K  Patient will be high risk off anticoagulation.  Recommended holding Eliquis for 1 day before procedure

## 2021-08-26 NOTE — Telephone Encounter (Signed)
Clinical pharmacist to review.  Patient is on Eliquis, not on Plavix.

## 2021-08-27 ENCOUNTER — Other Ambulatory Visit: Payer: Self-pay

## 2021-08-27 DIAGNOSIS — D638 Anemia in other chronic diseases classified elsewhere: Secondary | ICD-10-CM

## 2021-08-27 DIAGNOSIS — K2210 Ulcer of esophagus without bleeding: Secondary | ICD-10-CM

## 2021-08-27 DIAGNOSIS — R1319 Other dysphagia: Secondary | ICD-10-CM

## 2021-08-27 NOTE — Telephone Encounter (Signed)
Betty Jackson,  The note I received back from her cardiologist Dr. Angelena Form when I forwarded him her office note was that he also agreed with a hold of Eliquis 1 day prior to procedure.  HD

## 2021-08-28 NOTE — Telephone Encounter (Signed)
I called [phone] Betty Jackson at [facility] to give medication hold orders. She requested I fax her the order to [phone]. This was done per her request.

## 2021-09-02 ENCOUNTER — Encounter (HOSPITAL_BASED_OUTPATIENT_CLINIC_OR_DEPARTMENT_OTHER): Payer: Self-pay | Admitting: Family

## 2021-09-02 ENCOUNTER — Ambulatory Visit (INDEPENDENT_AMBULATORY_CARE_PROVIDER_SITE_OTHER): Payer: Medicare Other | Admitting: Family

## 2021-09-02 VITALS — BP 124/60 | HR 88 | Ht 64.5 in | Wt 172.1 lb

## 2021-09-02 DIAGNOSIS — I25118 Atherosclerotic heart disease of native coronary artery with other forms of angina pectoris: Secondary | ICD-10-CM | POA: Diagnosis not present

## 2021-09-02 DIAGNOSIS — I480 Paroxysmal atrial fibrillation: Secondary | ICD-10-CM | POA: Diagnosis not present

## 2021-09-02 DIAGNOSIS — E785 Hyperlipidemia, unspecified: Secondary | ICD-10-CM

## 2021-09-02 DIAGNOSIS — I10 Essential (primary) hypertension: Secondary | ICD-10-CM

## 2021-09-02 DIAGNOSIS — I5032 Chronic diastolic (congestive) heart failure: Secondary | ICD-10-CM

## 2021-09-02 DIAGNOSIS — D6859 Other primary thrombophilia: Secondary | ICD-10-CM

## 2021-09-02 DIAGNOSIS — Z5181 Encounter for therapeutic drug level monitoring: Secondary | ICD-10-CM

## 2021-09-02 DIAGNOSIS — I739 Peripheral vascular disease, unspecified: Secondary | ICD-10-CM

## 2021-09-02 NOTE — Progress Notes (Unsigned)
Office Visit    Patient Name: Betty Jackson Date of Encounter: 09/03/2021  PCP:  Betty Morin, MD   [place]  Cardiologist:  Betty Chandler, MD  Advanced Practice Provider:  No care team member to display Electrophysiologist:  None     Chief Complaint    Betty Jackson is a 87 y.o. female with a hx of CAD, hypertension, PAF, CKD 3, CVA, rheumatoid arthritis/giant cell arteritis  presents today for hospital follow up   Past Medical History    Past Medical History:  Diagnosis Date   Acute respiratory failure with hypoxia ([place]) 05/20/2017   Anemia, unspecified 10/28/2012   Anxiety state, unspecified 10/28/2012   CAD (coronary artery disease)    a. Stent RCA in [facility];  b. Cath approx 2009 - nonobs per pt report.   Cataract    immature on the left eye   Chronic insomnia 02/07/2013   Chronic lower back pain    scoliosis   CKD (chronic kidney disease) stage 3, GFR 30-59 ml/min (HCC)    CVA (cerebral infarction) 10/29/2012   DDD (degenerative disc disease)    Diverticulosis    GERD (gastroesophageal reflux disease) 09/01/2010   Hemorrhoids    Herniated nucleus pulposus, L5-S1, right 11/04/2015   Hyperlipidemia    takes Lipitor daily   Hypertension    takes Amlodipine,Losartan,Metoprolol,and HCTZ daily   Incontinence of urine    Insomnia    takes Restoril nightly   Lumbar stenosis 04/25/2013   Major depressive disorder, recurrent episode, moderate ([place]) 07/16/2013   Osteoporosis    PAF (paroxysmal atrial fibrillation) ([place]) 2011   a. lone epidode in 2011 according to notes.   Rheumatoid arthritis ([place])    Scoliosis    Sepsis ([place]) 05/2019   Stroke ([place])    Temporal arteritis ([place]) 2011   a. followed @ [place]; potential flareup 10/28/2012/notes 10/28/2012   Vocal cord dysfunction    "they don't operate properly" (10/28/2012)   Past Surgical History:  Procedure Laterality Date   ABDOMINAL HYSTERECTOMY  ~ 1984   vaginally    BACK SURGERY  7-33yr ago   X Stop   BLADDER SUSPENSION  2001   BREAST BIOPSY Right    CATARACT EXTRACTION W/ INTRAOCULAR LENS IMPLANT Right ~ 08/2012   CHEST TUBE INSERTION Left 03/08/2019   Procedure: Chest Tube Insertion;  Surgeon: Betty Poot MD;  [facility]  Service: Thoracic;  Laterality: Left;   COLONOSCOPY  01/26/2012   Procedure: COLONOSCOPY;  Surgeon: Betty Artist MD,FACG;  [facility]  Service: Endoscopy;  Laterality: N/A;  note the EGD is possible   CORONARY ANGIOPLASTY WITH STENT PLACEMENT  2006   X 1 stent   EPIDURAL BLOCK INJECTION     ESOPHAGOGASTRODUODENOSCOPY  01/26/2012   Procedure: ESOPHAGOGASTRODUODENOSCOPY (EGD);  Surgeon: Betty Artist MD,FACG;  [facility]  Service: Endoscopy;  Laterality: N/A;   ESOPHAGOGASTRODUODENOSCOPY (EGD) WITH PROPOFOL N/A 08/05/2021   Procedure: ESOPHAGOGASTRODUODENOSCOPY (EGD) WITH PROPOFOL;  Surgeon: Betty Stabler MD;  [place]  Service: Gastroenterology;  Laterality: N/A;   FINE NEEDLE ASPIRATION Right 09/26/2019   Procedure: FINE NEEDLE ASPIRATION;  Surgeon: HAltamese Dickenson MD;  [place] [facility]  Service: Orthopedics;  Laterality: Right;   FOREIGN BODY REMOVAL  08/05/2021   Procedure: FOREIGN BODY REMOVAL;  Surgeon: Betty Stabler MD;  [facility];  Service: Gastroenterology;;   HEMIARTHROPLASTY HIP Right 2012   IR EPIDUROGRAPHY  04/20/2019   LAPAROSCOPIC CHOLECYSTECTOMY  2001   LUMBAR FUSION  03/2013   LUMBAR LAMINECTOMY/DECOMPRESSION MICRODISCECTOMY Right 11/04/2015   Procedure: Right Lumbar Five-Sacral One Microdiskectomy;  Surgeon: Betty Miss, MD;  Location: [place] [facility] OR[facility];  Service: Neurosurgery;  Laterality: Right;  Right L5-S1 Microdiskectomy   moles removed that required stiches     one on leg and one on face   ORIF FEMUR FRACTURE Right 09/26/2019   Procedure: OPEN REDUCTION INTERNAL FIXATION (ORIF) DISTAL FEMUR FRACTURE;  Surgeon: Betty Samburg, MD;   Location: [place];  Service: Orthopedics;  Laterality: Right;   ORIF FEMUR FRACTURE Right 10/17/2019   Procedure: OPEN REDUCTION INTERNAL FIXATION (ORIF) DISTAL FEMUR FRACTURE;  Surgeon: Betty Evadale, MD;  Location: [place];  Service: Orthopedics;  Laterality: Right;   SUBXYPHOID PERICARDIAL WINDOW N/A 03/08/2019   Procedure: SUBXYPHOID PERICARDIAL WINDOW;  Surgeon: Betty Poot, MD;  Location: [place];  Service: Thoracic;  Laterality: N/A;   TEE WITHOUT CARDIOVERSION N/A 03/08/2019   Procedure: TRANSESOPHAGEAL ECHOCARDIOGRAM (TEE);  Surgeon: Betty Jackson, Betty Salina, MD;  [facility];  Service: Thoracic;  Laterality: N/A;   TEMPORAL ARTERY BIOPSY / LIGATION Bilateral 2011   TONSILLECTOMY AND ADENOIDECTOMY     at age 56   [place]  ~ [place]  ~ 2010   "lower back" (10/28/2012)    Allergies  Allergies  Allergen Reactions   Dextromethorphan Rash    History of Present Illness    Betty Jackson is a 87 y.o. female with a hx of CAD, hypertension, PAF, CKD 3, CVA, rheumatoid arthritis/giant cell arteritis last seen while hospitalized.  Prior stent placement RCA in [place] in 2005.  Subsequently with moved to [place].  Normal ABI 2012.  Mild carotid disease by Doppler 2014.  Cardiac cath 11/2012 patent RCA stent, mild disease LAD and circumflex, normal filling pressures.  Echo August 2014 normal LV EF, no significant valvular abnormalities.  Stress Myoview 06/29/2014 ordered due to chest pain with no ischemia.  Admitted 04/2017 with influenza A, pneumonia, sepsis.  Beta-blocker held and she developed atrial fibrillation with RVR as well as PVC and PAC  She converted back to sinus rhythm after several hours.  Echo 04/2017 normal LVEF, grade 2 diastolic dysfunction, mild AI.  She was not discharged on anticoagulation.  She wore cardiac monitor with recurrent atrial for with CHA2DS2-VASc of 4 and was started on Eliquis.  08/2017 stress test ordered due to chest pain with no ischemia.  At  visit 6-24/19 she noted increasing lower extremity edema and Lasix was increased and Norvasc stopped.  Admitted 02/2019 with large pericardial effusion and subxiphoid window per Dr. Darcey Nora.  Effusion was culture negative and pathology showing only inflammation.  Admitted 03/2019 with left ischium fracture -no surgery performed.  Troponin elevated in setting of possible sepsis but blood cultures negative.  Echo 03/2019 with normal LVEF, no significant valvular disease.  She was seen 05/21/2021 by Dr. Angelena Form doing well from a cardiac perspective and no changes were made at that time.  Subsequently admitted 08/06/2021 with pneumonia.  Had atrial fibrillation during admission and converted to normal sinus rhythm on amiodarone.  Recommended for 200 mg twice daily for 7 days then daily.  Beta-blocker stopped due to bradycardia.  Eliquis was continued.  She presents today for follow up with her daughter. Presently at SNF with plans to stay long term. Enjoys listening to books on tape. Notes she has repeat endoscopy upcoming on the 17th. Feels her  mobility has decreased since recent admission. Notes worsening edema in her ankles. Taking Lasix '40mg'$  QD at facility but no additional PRN dosing. Her Hb was checked 08/22/21 and was improved from hospital discharge. BP had been high in the mornings and MD at facility recently increased Diltiazem to '180mg'$  QD. Denies chest pain, palpitations, orthopnea, PND. She has dyspnea with more than usual activity. Does note for the past two days she has had some discomfort in her previous dental extraction site from February and plans to try to see dentist as work in appointment today. She did have home visit from heath insurane Rn who did a scan on her legs and noted diminished blood flow.   EKGs/Labs/Other Studies Reviewed:   The following studies were reviewed today: TTE 01-May-2019  1. Left ventricular ejection fraction, by visual estimation, is 60 to  65%. The left ventricle  has normal function. There is mildly increased  left ventricular hypertrophy.   2. Left ventricular diastolic parameters are consistent with Grade II  diastolic dysfunction (pseudonormalization). The mean left atrial pressure  is high.   3. The left ventricle has no regional wall motion abnormalities.   4. Global right ventricle has normal systolic function.The right  ventricular size is normal. No increase in right ventricular wall  thickness.   5. Left atrial size was mildly dilated.   6. Right atrial size was mildly dilated.   7. Mild mitral annular calcification.   8. The mitral valve is normal in structure. Trivial mitral valve  regurgitation.   9. The tricuspid valve is normal in structure.  10. The tricuspid valve is normal in structure. Tricuspid valve  regurgitation is mild-moderate.  11. The aortic valve is normal in structure. Aortic valve regurgitation is  trivial.  12. The pulmonic valve was not well visualized. Pulmonic valve  regurgitation is not visualized.  13. Mildly elevated pulmonary artery systolic pressure.  14. The tricuspid regurgitant velocity is 2.18 m/s, and with an assumed  right atrial pressure of 15 mmHg, the estimated right ventricular systolic  pressure is mildly elevated at 34.0 mmHg.  15. The inferior vena cava is dilated in size with <50% respiratory  variability, suggesting right atrial pressure of 15 mmHg.  16. There is exaggerated respiratory displacement of the interventricular  septum. This could represent contrictive physiology , but can also be seen  with increased work of breathing (e.g. COPD or asthma exacerbation).  76. Compared to 03/09/2019, left ventricular wall motion and overall  systolic function have improved.     EKG:  EKG is ordered today.  The ekg ordered today demonstrates NSR 90 bpm with overall flat T wave. Independent measurement of QTc with Bazett formula QTc 490.  Recent Labs: 08/05/2021: ALT 21 08/06/2021: TSH  0.801 08/11/2021: Magnesium 2.2 09/03/2021: BUN 29; Creatinine, Ser 1.81; Hemoglobin 11.7; Platelets 203; Potassium 4.4; Sodium 138  Recent Lipid Panel    Component Value Date/Time   CHOL 171 11/14/2018 1420   CHOL 155 08/18/2017 1241   TRIG 168.0 (H) 11/14/2018 1420   HDL 63.40 11/14/2018 1420   HDL 78 08/18/2017 1241   CHOLHDL 3 11/14/2018 1420   VLDL 33.6 11/14/2018 1420   LDLCALC 74 11/14/2018 1420   LDLCALC 51 08/18/2017 1241    Risk Assessment/Calculations:   CHA2DS2-VASc Score = 8   This indicates a 10.8% annual risk of stroke. The patient's score is based upon: CHF History: 1 HTN History: 1 Diabetes History: 0 Stroke History: 2 Vascular Disease History: 1 Age  Score: 2 Gender Score: 1     Home Medications   Current Meds  Medication Sig   acetaminophen (TYLENOL) 500 MG tablet Take 500 mg by mouth every 6 (six) hours as needed for mild pain.   allopurinol (ZYLOPRIM) 100 MG tablet Take 100 mg by mouth daily.   amiodarone (PACERONE) 200 MG tablet Take '200mg'$  twice daily for 5 days, then take '200mg'$  once daily.   atorvastatin (LIPITOR) 80 MG tablet Take 1 tablet (80 mg total) by mouth daily. (Patient taking differently: Take 80 mg by mouth at bedtime.)   cetirizine (ZYRTEC) 10 MG tablet Take 10 mg by mouth at bedtime.   cholecalciferol (VITAMIN D) 25 MCG tablet Take 2 tablets (2,000 Units total) by mouth 2 (two) times daily.   diltiazem (CARDIZEM CD) 180 MG 24 hr capsule Take 180 mg by mouth daily.   ELIQUIS 5 MG TABS tablet TAKE 1 TABLET BY MOUTH TWICE A DAY (Patient taking differently: Take 5 mg by mouth 2 (two) times daily.)   Ensure (ENSURE) Take 237 mLs by mouth 2 (two) times daily between meals.   escitalopram (LEXAPRO) 10 MG tablet TAKE 1 TABLET BY MOUTH EVERY DAY (Patient taking differently: Take 10 mg by mouth daily.)   fentaNYL 37.5 MCG/HR PT72 Place 1 patch onto the skin every 3 (three) days.   ferrous sulfate 325 (65 FE) MG tablet Take 1 tablet (325 mg total)  by mouth 2 (two) times daily with a meal.   furosemide (LASIX) 40 MG tablet Take 40 mg by mouth daily. OK TO TAKE AN EXTRA 40 MG DAILY AS NEEDED FOR WEIGHT GAIN OF 2 POUNDS IN 24 HOURS OR 5 POUNDS IN A WEEK   HYDROcodone-acetaminophen (NORCO) 5-325 MG tablet Take 1 tablet by mouth every 12 (twelve) hours as needed for moderate pain.   ipratropium-albuterol (DUONEB) 0.5-2.5 (3) MG/3ML SOLN Take 3 mLs by nebulization every 4 (four) hours as needed.   losartan (COZAAR) 50 MG tablet Take 50 mg by mouth daily.   Multiple Vitamin (MULTIVITAMIN WITH MINERALS) TABS tablet Take 1 tablet by mouth daily.   ondansetron (ZOFRAN) 4 MG tablet Take 4 mg by mouth every 8 (eight) hours as needed for nausea or vomiting.   pantoprazole (PROTONIX) 40 MG tablet Take 1 tablet (40 mg total) by mouth 2 (two) times daily.   potassium chloride (KLOR-CON) 10 MEQ tablet Take 1 tablet (10 mEq total) by mouth daily.   predniSONE (DELTASONE) 10 MG tablet Take 1 tablet (10 mg total) by mouth daily with breakfast. Resume after 3 days of '20mg'$  daily as instructed.   QUEtiapine (SEROQUEL) 25 MG tablet Take 1 tablet (25 mg total) by mouth at bedtime.   sucralfate (CARAFATE) 1 GM/10ML suspension Take 10 mLs (1 g total) by mouth 3 (three) times daily after meals, bedtime and 0200.   sulfaSALAzine (AZULFIDINE) 500 MG EC tablet Take 500 mg by mouth daily.   [DISCONTINUED] furosemide (LASIX) 40 MG tablet Take 1 tablet (40 mg total) by mouth daily. Please keep upcoming Dr. appt for future refills. Thank you (Patient taking differently: Take 40 mg by mouth daily. OK TO TAKE AN EXTRA 40 MG DAILY AS NEEDED FOR WEIGHT GAIN OF 2 POUNDS IN 24 HOURS OR 5 POUNDS IN A WEEK)     Review of Systems      All other systems reviewed and are otherwise negative except as noted above.  Physical Exam    VS:  BP 124/60 (BP Location: Right Arm, Patient Position:  Sitting, Cuff Size: Normal)   Pulse 88   Ht 5' 4.5" (1.638 m)   Wt 172 lb 1.6 oz (78.1 kg)    SpO2 90%   BMI 29.08 kg/m  , BMI Body mass index is 29.08 kg/m.  Wt Readings from Last 3 Encounters:  09/02/21 172 lb 1.6 oz (78.1 kg)  08/26/21 172 lb (78 kg)  08/11/21 179 lb 0.2 oz (81.2 kg)     GEN: Well nourished, well developed, in no acute distress. HEENT: normal. Neck: Supple, no JVD, carotid bruits, or masses. Cardiac: RRR, no murmurs, rubs, or gallops. No clubbing, cyanosis. Nonpitting pretibial edema bilaterally. Radials 2+ and equal bilaterally. PT 1+ and equal bilaterally Respiratory:  Respirations regular and unlabored, clear to auscultation bilaterally. GI: Soft, nontender, nondistended. MS: No deformity or atrophy. Skin: Warm and dry, no rash. Neuro:  Strength and sensation are intact. Psych: Normal affect.  Assessment & Plan     PAF / hypercoagulable state /sinus bradycardia / On amiodarone therapy - Maintaining NSR. Denies palpitations. Continue Amiodarone '200mg'$  QD, Diltaizem '180mg'$  QD. BB discontinued during admission due to bradycardia. Tolerating Eliquis '5mg'$  BID without bleeding complications. Does not meet dose reduction criteria based on BMP 08/11/21 with creatinine 1.00.  Amiodarone monitoring: Due for LFT/TSH in 3 weeks as will have completed 6 weeks of therapy. Asked SNF to collect LFT/TSH in 3 weeks.   Hypertension - BP well controlled. Continue current antihypertensive regimen.   ?Claudication - Insurance RN visit about 4 weeks ago with reported reduce blood flow to her bilateral legs. She is somewhat sedentary so difficult to ascertain if she has significant pain with mobility. She does have bilateral 1+ PT. Will order ABI for further evaluation.  CAD/HLD, LDL goal less than 70-Taxus DES in RCA placed in 2005 in [place].  Cath count abdomen 2014 with patent RCA stent, mild LAD and circumflex disease.  Stress test June 2019 without ischemia. Stable with no anginal symptoms. No indication for ischemic evaluation.  GDMT includes atorvastatin. No aspirin due to [facility]  and no BB due to prior bradycardia. Heart healthy diet and regular cardiovascular exercise encouraged.    Chronic diastolic heart failure - Nonpitting LE edema. Increase Lasix to '80mg'$  daily x 2 days then return to '40mg'$  QD. Low sodium diet, fluid restriction <2L, and daily weights encouraged. Educated to contact our office for weight gain of 2 lbs overnight or 5 lbs in one week. I have asked SNF to weigh her daily and provide an additional '40mg'$  of Lasix PRN for weight gain of 2 pounds overnight or 5 pounds in one week.   Disposition: Follow up in 2 month(s) with Betty Chandler, MD or APP.  Signed, Loel Dubonnet, NP 09/02/2021, 5:07 PM [place]

## 2021-09-02 NOTE — Patient Instructions (Signed)
Medication Instructions:  INCREASE YOUR FUROSEMIDE TO 80 MG DAILY FOR 2 DAYS ONLY   CHANGE FUROSEMIDE TO 40 MG DAILY AND AS NEEDED FOR WEIGHT GAIN OF 2 POUNDS IN 24 HOURS OR 5 POUNDS IN 2 WEEK   *If you need a refill on your cardiac medications before your next appointment, please call your pharmacy*  Lab Work: CMET/TSH IN 3 WEEKS   Testing/Procedures: Your physician has requested that you have an ankle brachial index (ABI). During this test an ultrasound and blood pressure cuff are used to evaluate the arteries that supply the arms and legs with blood. Allow thirty minutes for this exam. There are no restrictions or special instructions.  Your physician has requested that you have a lower or upper extremity arterial duplex. This test is an ultrasound of the arteries in the legs or arms. It looks at arterial blood flow in the legs and arms. Allow one hour for Lower and Upper Arterial scans. There are no restrictions or special instructions  Follow-Up: At [facility], you and your health needs are our priority.  As part of our continuing mission to provide you with exceptional heart care, we have created designated Provider Care Teams.  These Care Teams include your primary Cardiologist (physician) and Advanced Practice Providers (APPs -  Physician Assistants and Nurse Practitioners) who all work together to provide you with the care you need, when you need it.  We recommend signing up for the patient portal called "MyChart".  Sign up information is provided on this After Visit Summary.  MyChart is used to connect with patients for Virtual Visits (Telemedicine).  Patients are able to view lab/test results, encounter notes, upcoming appointments, etc.  Non-urgent messages can be sent to your provider as well.   To learn more about what you can do with MyChart, go to [email or web address].    Your next appointment:   2 month(s)  The format for your next appointment:   In  Person  Provider:   [name], MD  OR NP/PA

## 2021-09-03 ENCOUNTER — Emergency Department (HOSPITAL_COMMUNITY): Payer: Medicare Other

## 2021-09-03 ENCOUNTER — Inpatient Hospital Stay (HOSPITAL_COMMUNITY)
Admission: EM | Admit: 2021-09-03 | Discharge: 2021-09-11 | DRG: 871 | Disposition: A | Discharge status: Skilled Nursing Facility | Payer: Medicare Other | Attending: Internal Medicine | Admitting: Internal Medicine

## 2021-09-03 ENCOUNTER — Other Ambulatory Visit: Payer: Self-pay

## 2021-09-03 DIAGNOSIS — I130 Hypertensive heart and chronic kidney disease with heart failure and stage 1 through stage 4 chronic kidney disease, or unspecified chronic kidney disease: Secondary | ICD-10-CM | POA: Diagnosis present

## 2021-09-03 DIAGNOSIS — Z9841 Cataract extraction status, right eye: Secondary | ICD-10-CM

## 2021-09-03 DIAGNOSIS — K5731 Diverticulosis of large intestine without perforation or abscess with bleeding: Secondary | ICD-10-CM | POA: Diagnosis not present

## 2021-09-03 DIAGNOSIS — M316 Other giant cell arteritis: Secondary | ICD-10-CM | POA: Diagnosis present

## 2021-09-03 DIAGNOSIS — Z20822 Contact with and (suspected) exposure to covid-19: Secondary | ICD-10-CM | POA: Diagnosis present

## 2021-09-03 DIAGNOSIS — J439 Emphysema, unspecified: Secondary | ICD-10-CM | POA: Diagnosis present

## 2021-09-03 DIAGNOSIS — Z981 Arthrodesis status: Secondary | ICD-10-CM

## 2021-09-03 DIAGNOSIS — Z961 Presence of intraocular lens: Secondary | ICD-10-CM | POA: Diagnosis present

## 2021-09-03 DIAGNOSIS — E860 Dehydration: Secondary | ICD-10-CM | POA: Diagnosis present

## 2021-09-03 DIAGNOSIS — F32A Depression, unspecified: Secondary | ICD-10-CM | POA: Diagnosis present

## 2021-09-03 DIAGNOSIS — Z8249 Family history of ischemic heart disease and other diseases of the circulatory system: Secondary | ICD-10-CM

## 2021-09-03 DIAGNOSIS — J9811 Atelectasis: Secondary | ICD-10-CM | POA: Diagnosis present

## 2021-09-03 DIAGNOSIS — I480 Paroxysmal atrial fibrillation: Secondary | ICD-10-CM | POA: Diagnosis present

## 2021-09-03 DIAGNOSIS — K209 Esophagitis, unspecified without bleeding: Secondary | ICD-10-CM | POA: Diagnosis not present

## 2021-09-03 DIAGNOSIS — R197 Diarrhea, unspecified: Secondary | ICD-10-CM | POA: Diagnosis present

## 2021-09-03 DIAGNOSIS — T380X5A Adverse effect of glucocorticoids and synthetic analogues, initial encounter: Secondary | ICD-10-CM | POA: Diagnosis not present

## 2021-09-03 DIAGNOSIS — M069 Rheumatoid arthritis, unspecified: Secondary | ICD-10-CM | POA: Diagnosis present

## 2021-09-03 DIAGNOSIS — Z8679 Personal history of other diseases of the circulatory system: Secondary | ICD-10-CM

## 2021-09-03 DIAGNOSIS — K0889 Other specified disorders of teeth and supporting structures: Secondary | ICD-10-CM | POA: Diagnosis present

## 2021-09-03 DIAGNOSIS — Z90710 Acquired absence of both cervix and uterus: Secondary | ICD-10-CM

## 2021-09-03 DIAGNOSIS — N189 Chronic kidney disease, unspecified: Secondary | ICD-10-CM | POA: Diagnosis not present

## 2021-09-03 DIAGNOSIS — N179 Acute kidney failure, unspecified: Secondary | ICD-10-CM

## 2021-09-03 DIAGNOSIS — D649 Anemia, unspecified: Secondary | ICD-10-CM | POA: Diagnosis not present

## 2021-09-03 DIAGNOSIS — I2510 Atherosclerotic heart disease of native coronary artery without angina pectoris: Secondary | ICD-10-CM | POA: Diagnosis present

## 2021-09-03 DIAGNOSIS — Z955 Presence of coronary angioplasty implant and graft: Secondary | ICD-10-CM

## 2021-09-03 DIAGNOSIS — Z96641 Presence of right artificial hip joint: Secondary | ICD-10-CM | POA: Diagnosis present

## 2021-09-03 DIAGNOSIS — D631 Anemia in chronic kidney disease: Secondary | ICD-10-CM | POA: Diagnosis present

## 2021-09-03 DIAGNOSIS — Z7901 Long term (current) use of anticoagulants: Secondary | ICD-10-CM

## 2021-09-03 DIAGNOSIS — E876 Hypokalemia: Secondary | ICD-10-CM | POA: Diagnosis present

## 2021-09-03 DIAGNOSIS — R112 Nausea with vomiting, unspecified: Secondary | ICD-10-CM

## 2021-09-03 DIAGNOSIS — Z888 Allergy status to other drugs, medicaments and biological substances status: Secondary | ICD-10-CM

## 2021-09-03 DIAGNOSIS — G4700 Insomnia, unspecified: Secondary | ICD-10-CM | POA: Diagnosis present

## 2021-09-03 DIAGNOSIS — F5104 Psychophysiologic insomnia: Secondary | ICD-10-CM | POA: Diagnosis present

## 2021-09-03 DIAGNOSIS — I5033 Acute on chronic diastolic (congestive) heart failure: Secondary | ICD-10-CM | POA: Diagnosis present

## 2021-09-03 DIAGNOSIS — Z9851 Tubal ligation status: Secondary | ICD-10-CM

## 2021-09-03 DIAGNOSIS — A4151 Sepsis due to Escherichia coli [E. coli]: Secondary | ICD-10-CM | POA: Diagnosis present

## 2021-09-03 DIAGNOSIS — K2970 Gastritis, unspecified, without bleeding: Secondary | ICD-10-CM | POA: Diagnosis present

## 2021-09-03 DIAGNOSIS — N1831 Chronic kidney disease, stage 3a: Secondary | ICD-10-CM | POA: Diagnosis present

## 2021-09-03 DIAGNOSIS — R918 Other nonspecific abnormal finding of lung field: Secondary | ICD-10-CM | POA: Diagnosis present

## 2021-09-03 DIAGNOSIS — N390 Urinary tract infection, site not specified: Secondary | ICD-10-CM | POA: Diagnosis present

## 2021-09-03 DIAGNOSIS — Z993 Dependence on wheelchair: Secondary | ICD-10-CM

## 2021-09-03 DIAGNOSIS — K644 Residual hemorrhoidal skin tags: Secondary | ICD-10-CM | POA: Diagnosis present

## 2021-09-03 DIAGNOSIS — K649 Unspecified hemorrhoids: Secondary | ICD-10-CM | POA: Diagnosis not present

## 2021-09-03 DIAGNOSIS — R1310 Dysphagia, unspecified: Secondary | ICD-10-CM | POA: Diagnosis present

## 2021-09-03 DIAGNOSIS — Z66 Do not resuscitate: Secondary | ICD-10-CM | POA: Diagnosis present

## 2021-09-03 DIAGNOSIS — K635 Polyp of colon: Secondary | ICD-10-CM | POA: Diagnosis not present

## 2021-09-03 DIAGNOSIS — D123 Benign neoplasm of transverse colon: Secondary | ICD-10-CM | POA: Diagnosis present

## 2021-09-03 DIAGNOSIS — K626 Ulcer of anus and rectum: Secondary | ICD-10-CM | POA: Diagnosis present

## 2021-09-03 DIAGNOSIS — K648 Other hemorrhoids: Secondary | ICD-10-CM | POA: Diagnosis present

## 2021-09-03 DIAGNOSIS — M549 Dorsalgia, unspecified: Secondary | ICD-10-CM | POA: Diagnosis present

## 2021-09-03 DIAGNOSIS — K3189 Other diseases of stomach and duodenum: Secondary | ICD-10-CM | POA: Diagnosis not present

## 2021-09-03 DIAGNOSIS — K5730 Diverticulosis of large intestine without perforation or abscess without bleeding: Secondary | ICD-10-CM | POA: Diagnosis present

## 2021-09-03 DIAGNOSIS — J9611 Chronic respiratory failure with hypoxia: Secondary | ICD-10-CM | POA: Diagnosis present

## 2021-09-03 DIAGNOSIS — E785 Hyperlipidemia, unspecified: Secondary | ICD-10-CM | POA: Diagnosis present

## 2021-09-03 DIAGNOSIS — A419 Sepsis, unspecified organism: Principal | ICD-10-CM

## 2021-09-03 DIAGNOSIS — M810 Age-related osteoporosis without current pathological fracture: Secondary | ICD-10-CM | POA: Diagnosis present

## 2021-09-03 DIAGNOSIS — Z8673 Personal history of transient ischemic attack (TIA), and cerebral infarction without residual deficits: Secondary | ICD-10-CM

## 2021-09-03 DIAGNOSIS — K2100 Gastro-esophageal reflux disease with esophagitis, without bleeding: Secondary | ICD-10-CM | POA: Diagnosis present

## 2021-09-03 DIAGNOSIS — M419 Scoliosis, unspecified: Secondary | ICD-10-CM | POA: Diagnosis present

## 2021-09-03 DIAGNOSIS — D124 Benign neoplasm of descending colon: Secondary | ICD-10-CM | POA: Diagnosis present

## 2021-09-03 DIAGNOSIS — F411 Generalized anxiety disorder: Secondary | ICD-10-CM | POA: Diagnosis present

## 2021-09-03 DIAGNOSIS — Z8701 Personal history of pneumonia (recurrent): Secondary | ICD-10-CM

## 2021-09-03 DIAGNOSIS — I509 Heart failure, unspecified: Secondary | ICD-10-CM

## 2021-09-03 DIAGNOSIS — Z79899 Other long term (current) drug therapy: Secondary | ICD-10-CM

## 2021-09-03 DIAGNOSIS — K921 Melena: Secondary | ICD-10-CM

## 2021-09-03 DIAGNOSIS — M109 Gout, unspecified: Secondary | ICD-10-CM | POA: Diagnosis present

## 2021-09-03 DIAGNOSIS — R110 Nausea: Secondary | ICD-10-CM | POA: Insufficient documentation

## 2021-09-03 DIAGNOSIS — G8929 Other chronic pain: Secondary | ICD-10-CM | POA: Diagnosis present

## 2021-09-03 DIAGNOSIS — J383 Other diseases of vocal cords: Secondary | ICD-10-CM | POA: Diagnosis present

## 2021-09-03 DIAGNOSIS — R6884 Jaw pain: Secondary | ICD-10-CM | POA: Diagnosis present

## 2021-09-03 DIAGNOSIS — I440 Atrioventricular block, first degree: Secondary | ICD-10-CM | POA: Diagnosis present

## 2021-09-03 DIAGNOSIS — Z7952 Long term (current) use of systemic steroids: Secondary | ICD-10-CM

## 2021-09-03 DIAGNOSIS — R6520 Severe sepsis without septic shock: Secondary | ICD-10-CM | POA: Diagnosis present

## 2021-09-03 LAB — URINALYSIS, ROUTINE W REFLEX MICROSCOPIC
Bilirubin Urine: NEGATIVE
Glucose, UA: NEGATIVE mg/dL
Ketones, ur: NEGATIVE mg/dL
Nitrite: NEGATIVE
Protein, ur: 30 mg/dL — AB
Specific Gravity, Urine: 1.034 — ABNORMAL HIGH (ref 1.005–1.030)
WBC, UA: >50 WBC/hpf — ABNORMAL HIGH (ref 0–5)
pH: 5 (ref 5.0–8.0)

## 2021-09-03 LAB — CBC WITH DIFFERENTIAL/PLATELET
Abs Immature Granulocytes: 0.05 10*3/uL (ref 0.00–0.07)
Basophils Absolute: 0 10*3/uL (ref 0.0–0.1)
Basophils Relative: 0 %
Eosinophils Absolute: 0 10*3/uL (ref 0.0–0.5)
Eosinophils Relative: 0 %
HCT: 38.7 % (ref 36.0–46.0)
Hemoglobin: 11.7 g/dL — ABNORMAL LOW (ref 12.0–15.0)
Immature Granulocytes: 0 %
Lymphocytes Relative: 2 %
Lymphs Abs: 0.2 10*3/uL — ABNORMAL LOW (ref 0.7–4.0)
MCH: 29.5 pg (ref 26.0–34.0)
MCHC: 30.2 g/dL (ref 30.0–36.0)
MCV: 97.5 fL (ref 80.0–100.0)
Monocytes Absolute: 0.3 10*3/uL (ref 0.1–1.0)
Monocytes Relative: 3 %
Neutro Abs: 11.5 10*3/uL — ABNORMAL HIGH (ref 1.7–7.7)
Neutrophils Relative %: 95 %
Platelets: 203 10*3/uL (ref 150–400)
RBC: 3.97 MIL/uL (ref 3.87–5.11)
RDW: 15.1 % (ref 11.5–15.5)
WBC: 12.2 10*3/uL — ABNORMAL HIGH (ref 4.0–10.5)
nRBC: 0 % (ref 0.0–0.2)

## 2021-09-03 LAB — APTT: aPTT: 35 seconds (ref 24–36)

## 2021-09-03 LAB — BASIC METABOLIC PANEL
Anion gap: 12 (ref 5–15)
BUN: 29 mg/dL — ABNORMAL HIGH (ref 8–23)
CO2: 27 mmol/L (ref 22–32)
Calcium: 8.3 mg/dL — ABNORMAL LOW (ref 8.9–10.3)
Chloride: 99 mmol/L (ref 98–111)
Creatinine, Ser: 1.81 mg/dL — ABNORMAL HIGH (ref 0.44–1.00)
GFR, Estimated: 27 mL/min — ABNORMAL LOW (ref >60)
Glucose, Bld: 188 mg/dL — ABNORMAL HIGH (ref 70–99)
Potassium: 4.4 mmol/L (ref 3.5–5.1)
Sodium: 138 mmol/L (ref 135–145)

## 2021-09-03 LAB — LACTIC ACID, PLASMA: Lactic Acid, Venous: 1.5 mmol/L (ref 0.5–1.9)

## 2021-09-03 LAB — RESP PANEL BY RT-PCR (FLU A&B, COVID) ARPGX2
Influenza A by PCR: NEGATIVE
Influenza B by PCR: NEGATIVE
SARS Coronavirus 2 by RT PCR: NEGATIVE

## 2021-09-03 LAB — PROTIME-INR
INR: 1.7 — ABNORMAL HIGH (ref 0.8–1.2)
Prothrombin Time: 20.1 seconds — ABNORMAL HIGH (ref 11.4–15.2)

## 2021-09-03 LAB — TROPONIN I (HIGH SENSITIVITY): Troponin I (High Sensitivity): 16 ng/L (ref <18)

## 2021-09-03 MED ORDER — SODIUM CHLORIDE 0.9 % IV BOLUS
1000.0000 mL | Freq: Once | INTRAVENOUS | Status: AC
  Administered 2021-09-03: 1000 mL via INTRAVENOUS

## 2021-09-03 MED ORDER — SODIUM CHLORIDE 0.9 % IV BOLUS (SEPSIS)
1000.0000 mL | Freq: Once | INTRAVENOUS | Status: AC
  Administered 2021-09-03: 1000 mL via INTRAVENOUS

## 2021-09-03 MED ORDER — SODIUM CHLORIDE 0.9 % IV SOLN
2.0000 g | INTRAVENOUS | Status: DC
  Administered 2021-09-03 – 2021-09-05 (×3): 2 g via INTRAVENOUS
  Filled 2021-09-03 (×3): qty 20

## 2021-09-03 MED ORDER — LACTATED RINGERS IV SOLN: INTRAVENOUS | Status: DC

## 2021-09-03 MED ORDER — ACETAMINOPHEN 325 MG PO TABS
650.0000 mg | ORAL_TABLET | ORAL | Status: AC
  Administered 2021-09-03: 650 mg via ORAL
  Filled 2021-09-03: qty 2

## 2021-09-03 MED ORDER — IOHEXOL 300 MG/ML  SOLN
75.0000 mL | Freq: Once | INTRAMUSCULAR | Status: AC | PRN
  Administered 2021-09-03: 75 mL via INTRAVENOUS

## 2021-09-03 NOTE — Sepsis Progress Note (Signed)
Sepsis protocol monitored by eLink 

## 2021-09-03 NOTE — ED Provider Notes (Signed)
[facility] Provider Note   CSN: [id number] Arrival date & time: 09/03/21  1202     History  Chief Complaint  Patient presents with   Jaw Pain    Betty Jackson is a 87 y.o. female who reports a history of lower tooth extraction approximately 4 months ago, presented ED with complaint of jaw pain and soreness.  She reports noticing this 2 days ago.  She thought there was a problem with her dental extractions.  She says she went to the dentist yesterday and was started on an antibiotic, whose name she cannot recall, and has taken 2 doses.  She was supposed to go see the orthopedic surgeon today, but instead decided to come to the ER, she states she was feeling "unwell".  HPI     Home Medications Prior to Admission medications   Medication Sig Start Date End Date Taking? Authorizing Provider  acetaminophen (TYLENOL) 500 MG tablet Take 500 mg by mouth every 6 (six) hours as needed for mild pain.    [provider]  allopurinol (ZYLOPRIM) 100 MG tablet Take 100 mg by mouth daily.    [provider]  amiodarone (PACERONE) 200 MG tablet Take '200mg'$  twice daily for 5 days, then take '200mg'$  once daily. 08/11/21   Bonnielee Haff, MD  atorvastatin (LIPITOR) 80 MG tablet Take 1 tablet (80 mg total) by mouth daily. Patient taking differently: Take 80 mg by mouth at bedtime. 09/25/20   Garvin Fila, MD  cetirizine (ZYRTEC) 10 MG tablet Take 10 mg by mouth at bedtime.    [provider]  cholecalciferol (VITAMIN D) 25 MCG tablet Take 2 tablets (2,000 Units total) by mouth 2 (two) times daily. 09/29/19   Ainsley Spinner, PA-C  diltiazem (CARDIZEM CD) 180 MG 24 hr capsule Take 180 mg by mouth daily. 09/01/21   [provider]  ELIQUIS 5 MG TABS tablet TAKE 1 TABLET BY MOUTH TWICE A DAY Patient taking differently: Take 5 mg by mouth 2 (two) times daily. 05/09/20   Burnell Blanks, MD  Ensure (ENSURE) Take 237 mLs by mouth 2 (two)  times daily between meals.    [provider]  escitalopram (LEXAPRO) 10 MG tablet TAKE 1 TABLET BY MOUTH EVERY DAY Patient taking differently: Take 10 mg by mouth daily. 08/27/20   Burchette, Alinda Sierras, MD  fentaNYL 37.5 MCG/HR PT72 Place 1 patch onto the skin every 3 (three) days. 08/10/21   Bonnielee Haff, MD  ferrous sulfate 325 (65 FE) MG tablet Take 1 tablet (325 mg total) by mouth 2 (two) times daily with a meal. 04/21/19   Oswald Hillock, MD  furosemide (LASIX) 40 MG tablet Take 40 mg by mouth daily. OK TO TAKE AN EXTRA 40 MG DAILY AS NEEDED FOR WEIGHT GAIN OF 2 POUNDS IN 24 HOURS OR 5 POUNDS IN A WEEK    [provider]  HYDROcodone-acetaminophen (NORCO) 5-325 MG tablet Take 1 tablet by mouth every 12 (twelve) hours as needed for moderate pain. 08/10/21   Bonnielee Haff, MD  ipratropium-albuterol (DUONEB) 0.5-2.5 (3) MG/3ML SOLN Take 3 mLs by nebulization every 4 (four) hours as needed. 08/10/21   Bonnielee Haff, MD  losartan (COZAAR) 50 MG tablet Take 50 mg by mouth daily. 08/27/21   [provider]  Multiple Vitamin (MULTIVITAMIN WITH MINERALS) TABS tablet Take 1 tablet by mouth daily. 10/21/19   Ainsley Spinner, PA-C  ondansetron (ZOFRAN) 4 MG tablet Take 4 mg by mouth  every 8 (eight) hours as needed for nausea or vomiting.    [provider]  pantoprazole (PROTONIX) 40 MG tablet Take 1 tablet (40 mg total) by mouth 2 (two) times daily. 08/11/21   Bonnielee Haff, MD  potassium chloride (KLOR-CON) 10 MEQ tablet Take 1 tablet (10 mEq total) by mouth daily. 06/07/20   Burchette, Alinda Sierras, MD  predniSONE (DELTASONE) 10 MG tablet Take 1 tablet (10 mg total) by mouth daily with breakfast. Resume after 3 days of '20mg'$  daily as instructed. 08/15/21   Bonnielee Haff, MD  QUEtiapine (SEROQUEL) 25 MG tablet Take 1 tablet (25 mg total) by mouth at bedtime. 03/16/21 09/02/21  Darliss Cheney, MD  sucralfate (CARAFATE) 1 GM/10ML suspension Take 10 mLs (1 g total) by mouth 3 (three)  times daily after meals, bedtime and 0200. 08/11/21   Bonnielee Haff, MD  sulfaSALAzine (AZULFIDINE) 500 MG EC tablet Take 500 mg by mouth daily.    [provider]      Allergies    Dextromethorphan    Review of Systems   Review of Systems  Physical Exam Updated Vital Signs BP (!) 134/56   Pulse (!) 104   Temp 98 F (36.7 C) (Oral)   Resp (!) 24   SpO2 93%  Physical Exam Constitutional:      General: She is not in acute distress. HENT:     Head: Normocephalic and atraumatic.     Comments: Raised nodule of the left lower gumline, soft and gummy in texture, tender, lower teeth edentulous Eyes:     Conjunctiva/sclera: Conjunctivae normal.     Pupils: Pupils are equal, round, and reactive to light.  Cardiovascular:     Rate and Rhythm: Normal rate and regular rhythm.  Pulmonary:     Effort: Pulmonary effort is normal. No respiratory distress.  Abdominal:     General: There is no distension.     Tenderness: There is no abdominal tenderness.  Skin:    General: Skin is warm and dry.  Neurological:     General: No focal deficit present.     Mental Status: She is alert. Mental status is at baseline.  Psychiatric:        Mood and Affect: Mood normal.        Behavior: Behavior normal.    ED Results / Procedures / Treatments   Labs (all labs ordered are listed, but only abnormal results are displayed) Labs Reviewed  BASIC METABOLIC PANEL - Abnormal; Notable for the following components:      Result Value   Glucose, Bld 188 (*)    BUN 29 (*)    Creatinine, Ser 1.81 (*)    Calcium 8.3 (*)    GFR, Estimated 27 (*)    All other components within normal limits  CBC WITH DIFFERENTIAL/PLATELET - Abnormal; Notable for the following components:   WBC 12.2 (*)    Hemoglobin 11.7 (*)    Neutro Abs 11.5 (*)    Lymphs Abs 0.2 (*)    All other components within normal limits  TROPONIN I (HIGH SENSITIVITY)  TROPONIN I (HIGH SENSITIVITY)    EKG EKG  Interpretation  Date/Time:  Wednesday September 03 2021 13:02:10 EDT Ventricular Rate:  80 PR Interval:  207 QRS Duration: 96 QT Interval:  387 QTC Calculation: 447 R Axis:   -7 Text Interpretation: Sinus rhythm Low voltage, precordial leads Abnormal R-wave progression, early transition Borderline T abnormalities, anterior leads Confirmed by Octaviano Glow [phone]) on 09/03/2021 4:20:54 PM  Radiology No results found.  Procedures Procedures    Medications Ordered in ED Medications  sodium chloride 0.9 % bolus 1,000 mL (1,000 mLs Intravenous New Bag/Given 09/03/21 1441)    ED Course/ Medical Decision Making/ A&P Clinical Course as of 09/03/21 1621  Wed Sep 03, 2021  1621 Pt pending CT neck tissue at the time of signout to Dr Noemi Chapel at 430 pm  [MT]    Clinical Course User Index [MT] Wyvonnia Dusky, MD                           Medical Decision Making Amount and/or Complexity of Data Reviewed Labs: ordered. Radiology: ordered. ECG/medicine tests: ordered.   Patient is here with pain in her lower jawline, thinks is correlated to her prior tooth extraction surgery.  She has a strange gumma-like structure near the extraction site of her bottom left incisor, which she states is new.  I am not certain what this is, whether this may be some type of postoperative prostatic or treatment, versus an infection.  We will check blood work, consider CT imaging of the soft tissue of the neck, but will also check an EKG and troponin to evaluate for possible atypical ACS, given her age.  I do not see evidence of airway compromise, or any clear evidence of Ludewig's angina, or allergic reaction otherwise.  Her airway is intact.  She does not present with signs or symptoms of sepsis.  Initial labs reviewed, patient does have some worsening of her creatinine and BUN consistent with prerenal AKI and some dehydration.  We will give a liter of fluid. Cr was 1.0 three weeks ago.  CT imaging ordered  and pending at the time of signout        Final Clinical Impression(s) / ED Diagnoses Final diagnoses:  None    Rx / DC Orders ED Discharge Orders     None         Hasna Stefanik, Carola Rhine, MD 09/03/21 1623

## 2021-09-03 NOTE — ED Triage Notes (Signed)
Pt BIB EMS for increasing jaw pain after tooth extraction on bottom tooth, both sides. Pt reports extraction occurred in Feb. But has become painful and site is discolored.

## 2021-09-03 NOTE — H&P (Addendum)
History and Physical    Betty Jackson QMV:[id number] DOB: 12/23/1934 DOA: 09/03/2021  PCP: Alvester Morin, MD  Patient coming from: Home  Chief Complaint: Jaw pain  HPI: Betty Jackson is a 87 y.o. female with medical history significant of CAD, hypertension, hyperlipidemia, PAF on Eliquis, CKD stage IIIa, CAD status post DES to RCA in 2005, HFpEF, CVA, rheumatoid arthritis/giant cell arteritis on chronic prednisone, anxiety/depression, chronic insomnia, chronic low back pain, GERD, osteoporosis, chronic hypoxic respiratory failure on baseline 2 L oxygen, wheel chair bound SNF resident.  Recently admitted 5/9 for dysphagia and underwent EGD which revealed severe esophagitis with bleeding and gastritis.  This was felt to be medication induced esophagitis due to alendronate which was discontinued.  Went into A-fib with RVR during this hospitalization, treated with IV amiodarone and discharged on oral amiodarone.  Metoprolol was discontinued due to bradycardia and Cardizem CD was held.  Patient also finished a 7-day antibiotic course for aspiration pneumonia. Patient had a hospital follow-up visit with GI on 5/30, Carafate discontinued and was advised to continue taking Protonix 40 mg twice daily, scheduled for repeat EGD on 7/17.  Seen by cardiology yesterday, dose of Lasix was increased to 80 mg daily x2 days due to lower extremity edema, then return to 40 mg daily.  Recommended continuing Eliquis 5 mg twice daily, amiodarone 200 mg daily, and diltiazem 180 mg daily.  Patient presented to the ED complaining of jaw pain in the setting of lower tooth extraction approximately 4 months ago.  Febrile and slightly tachycardic.  Labs showing WBC 12.2, hemoglobin 11.7 (stable), platelet count 203k.  Sodium 138, potassium 4.4, chloride 99, bicarb 27, BUN 29, creatinine 1.8 (baseline 0.9-1.0), glucose 188.  High-sensitivity troponin negative.  UA with large amount of leukocytes and microscopy showing greater  than 50 WBCs and many bacteria.  Urine culture pending.  Blood cultures drawn.  Lactic acid 1.5.  INR 1.7.  COVID and influenza PCR negative.    Chest x-ray showing stable left basilar opacities favored to be atelectasis or scarring.  There is one area which is slightly nodular, follow-up chest CT recommended.  No new focal lung infiltrate.  CT soft tissue neck with contrast showing no significant inflammatory changes or abscess.  Patient was given Tylenol, ceftriaxone, and 2 L normal saline boluses.  Patient reports 1 day history of nausea, vomiting, and fevers.  Also reports suprapubic discomfort, dysuria, and urinary frequency/urgency.  She thinks she is also having diarrhea but has not seen her stool.  Denies cough, shortness of breath, or chest pain.  States she had her lower teeth extracted back in February and thinks it still has not healed.  She was supposed to see her oral surgeon yesterday but instead came into the ED as she was not feeling well.  Review of Systems:  Review of Systems  All other systems reviewed and are negative.  Past Medical History:  Diagnosis Date   Acute respiratory failure with hypoxia ([place]) 05/20/2017   Anemia, unspecified 10/28/2012   Anxiety state, unspecified 10/28/2012   CAD (coronary artery disease)    a. Stent RCA in [facility];  b. Cath approx 2009 - nonobs per pt report.   Cataract    immature on the left eye   Chronic insomnia 02/07/2013   Chronic lower back pain    scoliosis   CKD (chronic kidney disease) stage 3, GFR 30-59 ml/min (HCC)    CVA (cerebral infarction) 10/29/2012   DDD (degenerative disc disease)  Diverticulosis    GERD (gastroesophageal reflux disease) 09/01/2010   Hemorrhoids    Herniated nucleus pulposus, L5-S1, right 11/04/2015   Hyperlipidemia    takes Lipitor daily   Hypertension    takes Amlodipine,Losartan,Metoprolol,and HCTZ daily   Incontinence of urine    Insomnia    takes Restoril nightly   Lumbar  stenosis 04/25/2013   Major depressive disorder, recurrent episode, moderate ([place]) 07/16/2013   Osteoporosis    PAF (paroxysmal atrial fibrillation) ([place]) 2011   a. lone epidode in 2011 according to notes.   Rheumatoid arthritis ([place])    Scoliosis    Sepsis ([place]) 05/2019   Stroke ([place])    Temporal arteritis ([place]) 2011   a. followed @ [facility]; potential flareup 10/28/2012/notes 10/28/2012   Vocal cord dysfunction    "they don't operate properly" (10/28/2012)    Past Surgical History:  Procedure Laterality Date   ABDOMINAL HYSTERECTOMY  ~ 1984   vaginally   BACK SURGERY  7-69yr ago   X Stop   BLADDER SUSPENSION  2001   BREAST BIOPSY Right    CATARACT EXTRACTION W/ INTRAOCULAR LENS IMPLANT Right ~ 08/2012   CHEST TUBE INSERTION Left 03/08/2019   Procedure: Chest Tube Insertion;  Surgeon: VIvin Poot MD;  [place]  Service: Thoracic;  Laterality: Left;   COLONOSCOPY  01/26/2012   Procedure: COLONOSCOPY;  Surgeon: MLadene Artist MD,FACG;  [facility]  Service: Endoscopy;  Laterality: N/A;  note the EGD is possible   CORONARY ANGIOPLASTY WITH STENT PLACEMENT  2006   X 1 stent   EPIDURAL BLOCK INJECTION     ESOPHAGOGASTRODUODENOSCOPY  01/26/2012   Procedure: ESOPHAGOGASTRODUODENOSCOPY (EGD);  Surgeon: MLadene Artist MD,FACG;  [facility];  Service: Endoscopy;  Laterality: N/A;   ESOPHAGOGASTRODUODENOSCOPY (EGD) WITH PROPOFOL N/A 08/05/2021   Procedure: ESOPHAGOGASTRODUODENOSCOPY (EGD) WITH PROPOFOL;  Surgeon: DDoran Stabler MD;  [place]  Service: Gastroenterology;  Laterality: N/A;   FINE NEEDLE ASPIRATION Right 09/26/2019   Procedure: FINE NEEDLE ASPIRATION;  Surgeon: HAltamese Goulds MD;  [place]  Service: Orthopedics;  Laterality: Right;   FOREIGN BODY REMOVAL  08/05/2021   Procedure: FOREIGN BODY REMOVAL;  Surgeon: DDoran Stabler MD;  [facility];  Service: Gastroenterology;;   HEMIARTHROPLASTY HIP Right 2012    IR EPIDUROGRAPHY  04/20/2019   LAPAROSCOPIC CHOLECYSTECTOMY  2001   LUMBAR FUSION  03/2013   LUMBAR LAMINECTOMY/DECOMPRESSION MICRODISCECTOMY Right 11/04/2015   Procedure: Right Lumbar Five-Sacral One Microdiskectomy;  Surgeon: HKristeen Miss MD;  [facility];  Service: Neurosurgery;  Laterality: Right;  Right L5-S1 Microdiskectomy   moles removed that required stiches     one on leg and one on face   ORIF FEMUR FRACTURE Right 09/26/2019   Procedure: OPEN REDUCTION INTERNAL FIXATION (ORIF) DISTAL FEMUR FRACTURE;  Surgeon: HAltamese Strasburg MD;  [place]  Service: Orthopedics;  Laterality: Right;   ORIF FEMUR FRACTURE Right 10/17/2019   Procedure: OPEN REDUCTION INTERNAL FIXATION (ORIF) DISTAL FEMUR FRACTURE;  Surgeon: HAltamese [place] MD;  [place]  Service: Orthopedics;  Laterality: Right;   SUBXYPHOID PERICARDIAL WINDOW N/A 03/08/2019   Procedure: SUBXYPHOID PERICARDIAL WINDOW;  Surgeon: VIvin Poot MD;  [place]  Service: Thoracic;  Laterality: N/A;   TEE WITHOUT CARDIOVERSION N/A 03/08/2019   Procedure: TRANSESOPHAGEAL ECHOCARDIOGRAM (TEE);  Surgeon: VPrescott Gum PCollier Salina MD;  [place]  Service: Thoracic;  Laterality: N/A;   TEMPORAL ARTERY BIOPSY / LIGATION Bilateral  2011   TONSILLECTOMY AND ADENOIDECTOMY     at age 64   TUBAL LIGATION  ~ [place]  ~ 2010   "lower back" (10/28/2012)     reports that she has never smoked. She has never used smokeless tobacco. She reports current alcohol use. She reports that she does not use drugs.  Allergies  Allergen Reactions   Dextromethorphan Rash    Family History  Problem Relation Age of Onset   Brain cancer Mother 71   Heart attack Father    Breast cancer Daughter 88   Heart disease Other    Hypertension Other    Arthritis Neg Hx    Colon cancer Neg Hx    Osteoporosis Neg Hx     Prior to Admission medications   Medication Sig Start Date End Date Taking? Authorizing Provider   acetaminophen (TYLENOL) 500 MG tablet Take 500 mg by mouth every 6 (six) hours as needed for mild pain.    [provider]  allopurinol (ZYLOPRIM) 100 MG tablet Take 100 mg by mouth daily.    [provider]  amiodarone (PACERONE) 200 MG tablet Take '200mg'$  twice daily for 5 days, then take '200mg'$  once daily. 08/11/21   Bonnielee Haff, MD  atorvastatin (LIPITOR) 80 MG tablet Take 1 tablet (80 mg total) by mouth daily. Patient taking differently: Take 80 mg by mouth at bedtime. 09/25/20   Garvin Fila, MD  cetirizine (ZYRTEC) 10 MG tablet Take 10 mg by mouth at bedtime.    [provider]  cholecalciferol (VITAMIN D) 25 MCG tablet Take 2 tablets (2,000 Units total) by mouth 2 (two) times daily. 09/29/19   Ainsley Spinner, PA-C  diltiazem (CARDIZEM CD) 180 MG 24 hr capsule Take 180 mg by mouth daily. 09/01/21   [provider]  ELIQUIS 5 MG TABS tablet TAKE 1 TABLET BY MOUTH TWICE A DAY Patient taking differently: Take 5 mg by mouth 2 (two) times daily. 05/09/20   Burnell Blanks, MD  Ensure (ENSURE) Take 237 mLs by mouth 2 (two) times daily between meals.    [provider]  escitalopram (LEXAPRO) 10 MG tablet TAKE 1 TABLET BY MOUTH EVERY DAY Patient taking differently: Take 10 mg by mouth daily. 08/27/20   Burchette, Alinda Sierras, MD  fentaNYL 37.5 MCG/HR PT72 Place 1 patch onto the skin every 3 (three) days. 08/10/21   Bonnielee Haff, MD  ferrous sulfate 325 (65 FE) MG tablet Take 1 tablet (325 mg total) by mouth 2 (two) times daily with a meal. 04/21/19   Oswald Hillock, MD  furosemide (LASIX) 40 MG tablet Take 40 mg by mouth daily. OK TO TAKE AN EXTRA 40 MG DAILY AS NEEDED FOR WEIGHT GAIN OF 2 POUNDS IN 24 HOURS OR 5 POUNDS IN A WEEK    [provider]  HYDROcodone-acetaminophen (NORCO) 5-325 MG tablet Take 1 tablet by mouth every 12 (twelve) hours as needed for moderate pain. 08/10/21   Bonnielee Haff, MD  ipratropium-albuterol (DUONEB) 0.5-2.5 (3)  MG/3ML SOLN Take 3 mLs by nebulization every 4 (four) hours as needed. 08/10/21   Bonnielee Haff, MD  losartan (COZAAR) 50 MG tablet Take 50 mg by mouth daily. 08/27/21   [provider]  Multiple Vitamin (MULTIVITAMIN WITH MINERALS) TABS tablet Take 1 tablet by mouth daily. 10/21/19   Ainsley Spinner, PA-C  ondansetron (ZOFRAN) 4 MG tablet Take 4 mg by mouth every 8 (eight) hours as needed for nausea or vomiting.  [provider]  pantoprazole (PROTONIX) 40 MG tablet Take 1 tablet (40 mg total) by mouth 2 (two) times daily. 08/11/21   Bonnielee Haff, MD  potassium chloride (KLOR-CON) 10 MEQ tablet Take 1 tablet (10 mEq total) by mouth daily. 06/07/20   Burchette, Alinda Sierras, MD  predniSONE (DELTASONE) 10 MG tablet Take 1 tablet (10 mg total) by mouth daily with breakfast. Resume after 3 days of '20mg'$  daily as instructed. 08/15/21   Bonnielee Haff, MD  QUEtiapine (SEROQUEL) 25 MG tablet Take 1 tablet (25 mg total) by mouth at bedtime. 03/16/21 09/02/21  Darliss Cheney, MD  sucralfate (CARAFATE) 1 GM/10ML suspension Take 10 mLs (1 g total) by mouth 3 (three) times daily after meals, bedtime and 0200. 08/11/21   Bonnielee Haff, MD  sulfaSALAzine (AZULFIDINE) 500 MG EC tablet Take 500 mg by mouth daily.    [provider]    Physical Exam: Vitals:   09/03/21 1945 09/03/21 2000 09/03/21 2015 09/03/21 2030  BP: (!) 116/59 (!) 113/56 120/63 (!) 115/58  Pulse: 98 97 95 95  Resp: 20 (!) '23 19 16  '$ Temp:      TempSrc:      SpO2: 94% 95% 95% 97%    Physical Exam Vitals reviewed.  Constitutional:      General: She is not in acute distress. HENT:     Head: Normocephalic and atraumatic.  Eyes:     Extraocular Movements: Extraocular movements intact.  Cardiovascular:     Rate and Rhythm: Normal rate and regular rhythm.     Pulses: Normal pulses.  Pulmonary:     Effort: Pulmonary effort is normal. No respiratory distress.     Breath sounds: Normal breath sounds. No wheezing or  rales.  Abdominal:     General: Bowel sounds are normal. There is no distension.     Palpations: Abdomen is soft.     Tenderness: There is no abdominal tenderness. There is no guarding or rebound.  Musculoskeletal:        General: No tenderness.     Cervical back: Normal range of motion.     Right lower leg: Edema present.     Left lower leg: Edema present.     Comments: 1+ to 2+ pedal edema  Skin:    General: Skin is warm and dry.  Neurological:     General: No focal deficit present.     Mental Status: She is alert and oriented to person, place, and time.     Labs on Admission: I have personally reviewed following labs and imaging studies  CBC: Recent Labs  Lab 09/03/21 1303  WBC 12.2*  NEUTROABS 11.5*  HGB 11.7*  HCT 38.7  MCV 97.5  PLT 470   Basic Metabolic Panel: Recent Labs  Lab 09/03/21 1303  NA 138  K 4.4  CL 99  CO2 27  GLUCOSE 188*  BUN 29*  CREATININE 1.81*  CALCIUM 8.3*   GFR: Estimated Creatinine Clearance: 23.2 mL/min (A) (by C-G formula based on SCr of 1.81 mg/dL (H)). Liver Function Tests: No results for input(s): AST, ALT, ALKPHOS, BILITOT, PROT, ALBUMIN in the last 168 hours. No results for input(s): LIPASE, AMYLASE in the last 168 hours. No results for input(s): AMMONIA in the last 168 hours. Coagulation Profile: Recent Labs  Lab 09/03/21 1843  INR 1.7*   Cardiac Enzymes: No results for input(s): CKTOTAL, CKMB, CKMBINDEX, TROPONINI in the last 168 hours. BNP (last 3 results) No results for input(s): PROBNP in the last  8760 hours. HbA1C: No results for input(s): HGBA1C in the last 72 hours. CBG: No results for input(s): GLUCAP in the last 168 hours. Lipid Profile: No results for input(s): CHOL, HDL, LDLCALC, TRIG, CHOLHDL, LDLDIRECT in the last 72 hours. Thyroid Function Tests: No results for input(s): TSH, T4TOTAL, FREET4, T3FREE, THYROIDAB in the last 72 hours. Anemia Panel: No results for input(s): VITAMINB12, FOLATE, FERRITIN,  TIBC, IRON, RETICCTPCT in the last 72 hours. Urine analysis:    Component Value Date/Time   COLORURINE AMBER (A) 09/03/2021 1821   APPEARANCEUR CLOUDY (A) 09/03/2021 1821   LABSPEC 1.034 (H) 09/03/2021 1821   PHURINE 5.0 09/03/2021 1821   GLUCOSEU NEGATIVE 09/03/2021 1821   GLUCOSEU NEGATIVE 07/31/2016 1018   HGBUR SMALL (A) 09/03/2021 1821   BILIRUBINUR NEGATIVE 09/03/2021 1821   BILIRUBINUR neg 07/31/2016 1012   KETONESUR NEGATIVE 09/03/2021 1821   PROTEINUR 30 (A) 09/03/2021 1821   UROBILINOGEN 0.2 07/31/2016 1018   UROBILINOGEN 0.2 07/31/2016 1012   NITRITE NEGATIVE 09/03/2021 1821   LEUKOCYTESUR LARGE (A) 09/03/2021 1821    Radiological Exams on Admission: I have personally reviewed images CT Soft Tissue Neck W Contrast  Result Date: 09/03/2021 CLINICAL DATA:  Soft tissue swelling, infection suspected; Post-operative lower gumline pain near incisor sites, swelling - evaluate for underlying tracking abscess (lower mouth) EXAM: CT NECK WITH CONTRAST TECHNIQUE: Multidetector CT imaging of the neck was performed using the standard protocol following the bolus administration of intravenous contrast. RADIATION DOSE REDUCTION: This exam was performed according to the departmental dose-optimization program which includes automated exposure control, adjustment of the mA and/or kV according to patient size and/or use of iterative reconstruction technique. CONTRAST:  13m OMNIPAQUE IOHEXOL 300 MG/ML  SOLN COMPARISON:  None Available. FINDINGS: Motion artifact is present at some levels. Pharynx and larynx: Unremarkable. No mass or swelling within limitation of streak artifact from dental amalgam. Salivary glands: Parotid and submandibular glands are unremarkable. Thyroid: Unremarkable. Lymph nodes: No enlarged or abnormal density nodes. Vascular: Major neck vessels are patent. Calcified plaque at the common carotid bifurcations. Limited intracranial: No abnormal enhancement. Visualized orbits:  Bilateral lens replacements. Mastoids and visualized paranasal sinuses: Mild mucosal thickening. Mastoid air cells are clear. Skeleton: Advanced degenerative changes of the cervical spine. Upper chest: No apical lung mass. Other: None. IMPRESSION: No significant inflammatory changes identified.  No abscess. Electronically Signed   By: PMacy MisM.D.   On: 09/03/2021 17:57   DG Chest Port 1 View  Result Date: 09/03/2021 CLINICAL DATA:  Questionable sepsis. EXAM: PORTABLE CHEST 1 VIEW COMPARISON:  Chest x-ray 08/06/2021. CT abdomen and pelvis 07/17/2020. FINDINGS: There is some stable linear and patchy airspace opacity in the left lower lung. One area is slightly nodular. Lungs are otherwise clear. No pleural effusion or pneumothorax. Cardiomediastinal silhouette is within normal limits. IMPRESSION: 1. Stable left basilar opacities favored is atelectasis or scarring. One area is slightly nodular. A follow-up chest CT should be considered. 2. No new focal lung infiltrate. Electronically Signed   By: ARonney AstersM.D.   On: 09/03/2021 19:04    EKG: Independently reviewed.  Sinus rhythm with first-degree AV block.  PR interval increased since prior tracing but otherwise no significant change.  Assessment and Plan  Sepsis secondary to UTI Meets criteria for sepsis with fever, tachycardia, and leukocytosis. No lactic acidosis or hypotension. UA with large amount of leukocytes and microscopy showing greater than 50 WBCs and many bacteria. -Continue ceftriaxone -Tachycardia resolved after IVF boluses -Urine and blood cultures  pending -Monitor WBC count   AKI on CKD IIIa Likely pre-renal due to dehydration and sepsis. BUN 29, creatinine 1.8 (baseline 0.9-1.0). Dose of Lasix increased by cardiology yesterday.  -Continue gentle IVF hydration -Avoid nephrotoxic agents/ hold Lasix and Losartan -Monitor renal function  Nausea and vomiting Possibly 2/2 UTI. COVID and influenza PCR negative. Abdominal  exam benign.  Not actively vomiting at this time. -Antiemetic prn -Lipase -LFTs -Continue PPI given severe esophagitis and gastritis on recent EGD  Diarrhea COVID and influenza PCR negative. Abdominal exam benign. -C diff PCR and GI pathogen panel -Enteric precautions  HFpEF Has mild pedal edema but no signs of pulmonary edema. Echo done in January 2021 showing LVEF 60 to 79%, grade 2 diastolic dysfunction. -Hold Lasix and losartan given AKI -Check BNP -Monitor volume status closely   First-degree AV block on EKG -Cardiac monitoring -Check TSH   CAD Stable, not endorsing any anginal symptoms. Not on Aspirin due to [facility]. Not on BB due to hx of bradycardia.  -Continue Eliquis and statin.  PAF Stable, currently in sinus rhythm. -Continue Eliquis, Amiodarone, and Diltiazem.  Tooth pain Patient had her lower incisors extracted 4 months ago. No gum swelling or obvious signs of oral infection at this time. CT soft tissue neck with contrast showing no significant inflammatory changes or abscess. -Outpatient follow-up with her oral surgeon.   Rheumatoid arthritis -Continue prednisone  Anxiety/ depression Chronic insomnia -Continue Lexapro and Seroquel  Chronic hypoxic respiratory failure on 2L O2 No change in O2 requirement from baseline. -Continue supplemental O2  Severe esophagitis Gastritis Seen on recent EGD and felt to be medication induced, alendronate was discontinued. -Continue Protonix 40 mg BID -Recently seen by GI, plan for repeat EGD on 7/17  Abnormal CXR finding Chest x-ray showing stable left basilar opacities favored to be atelectasis or scarring.  There is one area which is slightly nodular.  No new focal lung infiltrate. -CT chest ordered for further evaluation  Gout -Continue allopurinol  DVT prophylaxis: Eliquis Code Status: DNR (confirmed with the patient) Family Communication: No family available at this time. Level of care: Telemetry  bed Admission status: It is my clinical opinion that admission to INPATIENT is reasonable and necessary because of the expectation that this patient will require hospital care that crosses at least 2 midnights to treat this condition based on the medical complexity of the problems presented.  Given the aforementioned information, the predictability of an adverse outcome is felt to be significant.   Shela Leff MD [facility]  If 7PM-7AM, please contact night-coverage [email or web address]  09/03/2021, 9:10 PM

## 2021-09-03 NOTE — ED Provider Notes (Signed)
This patient is a very pleasant 87 year old female presenting in the care of her daughter, they are both historians  They report that she has not been feeling well over the last couple of days, today was much worse, on the way to the oral surgeon the patient became very diaphoretic and had nausea vomiting and diarrhea.  The patient reports to me that she has had dysuria intermittently for "quite some time".  She has felt very weak and has had some cold chills today.  She has had some difficulty with her gums after having a dental extraction and was supposed to go to the oral surgeons office today but because of severe fatigue she decided to come here instead.  Ambulance was called.  On my exam the patient is tachycardic to about 105 bpm, she is normotensive, she is very hot to the touch and has a tactile temperature.  She has a minimally tender suprapubic area.  She has slightly dry mucous membranes and what appears to be some gingival abnormalities but no obvious jaw abscesses, no trismus or torticollis, no lymphadenopathy the neck  I suspect the patient is septic with severe sepsis, with a leukocytosis of 12,000, an acute kidney injury of 1.8.  At this time I do not have an obvious answer to the cause of her symptoms but I suspect that it is urinary in nature.  I have looked at her skin and she does not have any obvious cellulitis, her lungs are clear and she is not hypoxic.  We will round out the rest of the sepsis labs with a lactic acid and blood cultures.  Unfortunately this was not recognized until I went in to reexamine the patient and she had a tactile temperature thus there was a slight delay in the activation of the code sepsis and antibiotic administration.  Of note the patient was given a dose of amoxicillin last night that was given to them from the dentist to treat possible oral infection, she has not had a dose today  Discussed the care with the hospitalist who will admit to the hospital.   The patient has an obvious UTI with renal dysfunction and a diagnosis consistent with severe sepsis  .Critical Care Performed by: Noemi Chapel, MD Authorized by: Noemi Chapel, MD   Critical care provider statement:    Critical care time (minutes):  30   Critical care time was exclusive of:  Teaching time and separately billable procedures and treating other patients   Critical care was necessary to treat or prevent imminent or life-threatening deterioration of the following conditions:  Sepsis   Critical care was time spent personally by me on the following activities:  Development of treatment plan with patient or surrogate, discussions with consultants, evaluation of patient's response to treatment, examination of patient, ordering and review of laboratory studies, ordering and review of radiographic studies, ordering and performing treatments and interventions, pulse oximetry, re-evaluation of patient's condition and review of old charts   I assumed direction of critical care for this patient from another provider in my specialty: yes     Care discussed with: admitting provider   Comments:       Final diagnoses:  Severe sepsis ([place])  Acute kidney injury ([place])  Urinary tract infection without hematuria, site unspecified      Noemi Chapel, MD 09/03/21 2121

## 2021-09-04 ENCOUNTER — Inpatient Hospital Stay (HOSPITAL_COMMUNITY): Payer: Medicare Other

## 2021-09-04 ENCOUNTER — Encounter (HOSPITAL_COMMUNITY): Payer: Self-pay | Admitting: Internal Medicine

## 2021-09-04 DIAGNOSIS — N390 Urinary tract infection, site not specified: Secondary | ICD-10-CM | POA: Diagnosis not present

## 2021-09-04 DIAGNOSIS — R918 Other nonspecific abnormal finding of lung field: Secondary | ICD-10-CM | POA: Diagnosis present

## 2021-09-04 DIAGNOSIS — A419 Sepsis, unspecified organism: Secondary | ICD-10-CM | POA: Diagnosis not present

## 2021-09-04 LAB — HEPATIC FUNCTION PANEL
ALT: 56 U/L — ABNORMAL HIGH (ref 0–44)
AST: 31 U/L (ref 15–41)
Albumin: 2.5 g/dL — ABNORMAL LOW (ref 3.5–5.0)
Alkaline Phosphatase: 159 U/L — ABNORMAL HIGH (ref 38–126)
Bilirubin, Direct: 0.3 mg/dL — ABNORMAL HIGH (ref 0.0–0.2)
Indirect Bilirubin: 0.6 mg/dL (ref 0.3–0.9)
Total Bilirubin: 0.9 mg/dL (ref 0.3–1.2)
Total Protein: 5.3 g/dL — ABNORMAL LOW (ref 6.5–8.1)

## 2021-09-04 LAB — CBC
HCT: 32.5 % — ABNORMAL LOW (ref 36.0–46.0)
Hemoglobin: 10 g/dL — ABNORMAL LOW (ref 12.0–15.0)
MCH: 29.4 pg (ref 26.0–34.0)
MCHC: 30.8 g/dL (ref 30.0–36.0)
MCV: 95.6 fL (ref 80.0–100.0)
Platelets: 203 10*3/uL (ref 150–400)
RBC: 3.4 MIL/uL — ABNORMAL LOW (ref 3.87–5.11)
RDW: 15.1 % (ref 11.5–15.5)
WBC: 12.6 10*3/uL — ABNORMAL HIGH (ref 4.0–10.5)
nRBC: 0 % (ref 0.0–0.2)

## 2021-09-04 LAB — BASIC METABOLIC PANEL
Anion gap: 10 (ref 5–15)
BUN: 23 mg/dL (ref 8–23)
CO2: 29 mmol/L (ref 22–32)
Calcium: 7.6 mg/dL — ABNORMAL LOW (ref 8.9–10.3)
Chloride: 102 mmol/L (ref 98–111)
Creatinine, Ser: 1.54 mg/dL — ABNORMAL HIGH (ref 0.44–1.00)
GFR, Estimated: 33 mL/min — ABNORMAL LOW (ref >60)
Glucose, Bld: 106 mg/dL — ABNORMAL HIGH (ref 70–99)
Potassium: 4 mmol/L (ref 3.5–5.1)
Sodium: 141 mmol/L (ref 135–145)

## 2021-09-04 LAB — TSH: TSH: 2.63 u[IU]/mL (ref 0.350–4.500)

## 2021-09-04 LAB — BRAIN NATRIURETIC PEPTIDE: B Natriuretic Peptide: 109.2 pg/mL — ABNORMAL HIGH (ref 0.0–100.0)

## 2021-09-04 LAB — LIPASE, BLOOD: Lipase: 30 U/L (ref 11–51)

## 2021-09-04 MED ORDER — APIXABAN 5 MG PO TABS
5.0000 mg | ORAL_TABLET | Freq: Two times a day (BID) | ORAL | Status: DC
  Administered 2021-09-04 – 2021-09-07 (×8): 5 mg via ORAL
  Filled 2021-09-04 (×8): qty 1

## 2021-09-04 MED ORDER — PANTOPRAZOLE SODIUM 40 MG PO TBEC
40.0000 mg | DELAYED_RELEASE_TABLET | Freq: Two times a day (BID) | ORAL | Status: DC
  Administered 2021-09-04 – 2021-09-11 (×15): 40 mg via ORAL
  Filled 2021-09-04 (×15): qty 1

## 2021-09-04 MED ORDER — PREDNISONE 20 MG PO TABS
10.0000 mg | ORAL_TABLET | Freq: Every day | ORAL | Status: DC
  Administered 2021-09-04: 10 mg via ORAL
  Filled 2021-09-04: qty 1

## 2021-09-04 MED ORDER — ONDANSETRON HCL 4 MG/2ML IJ SOLN
4.0000 mg | Freq: Four times a day (QID) | INTRAMUSCULAR | Status: DC | PRN
  Administered 2021-09-04: 4 mg via INTRAVENOUS
  Filled 2021-09-04: qty 2

## 2021-09-04 MED ORDER — QUETIAPINE FUMARATE 25 MG PO TABS
25.0000 mg | ORAL_TABLET | Freq: Every day | ORAL | Status: DC
  Administered 2021-09-04 – 2021-09-10 (×8): 25 mg via ORAL
  Filled 2021-09-04 (×9): qty 1

## 2021-09-04 MED ORDER — ACETAMINOPHEN 325 MG PO TABS
650.0000 mg | ORAL_TABLET | Freq: Four times a day (QID) | ORAL | Status: DC | PRN
  Administered 2021-09-04 – 2021-09-10 (×4): 650 mg via ORAL
  Filled 2021-09-04 (×4): qty 2

## 2021-09-04 MED ORDER — ATORVASTATIN CALCIUM 80 MG PO TABS
80.0000 mg | ORAL_TABLET | Freq: Every day | ORAL | Status: DC
  Administered 2021-09-04 – 2021-09-10 (×8): 80 mg via ORAL
  Filled 2021-09-04 (×8): qty 1

## 2021-09-04 MED ORDER — DILTIAZEM HCL ER COATED BEADS 180 MG PO CP24
180.0000 mg | ORAL_CAPSULE | Freq: Every day | ORAL | Status: DC
  Administered 2021-09-04 – 2021-09-11 (×8): 180 mg via ORAL
  Filled 2021-09-04 (×8): qty 1

## 2021-09-04 MED ORDER — AMIODARONE HCL 200 MG PO TABS
200.0000 mg | ORAL_TABLET | Freq: Every day | ORAL | Status: DC
  Administered 2021-09-04 – 2021-09-11 (×8): 200 mg via ORAL
  Filled 2021-09-04 (×8): qty 1

## 2021-09-04 MED ORDER — ALLOPURINOL 100 MG PO TABS
100.0000 mg | ORAL_TABLET | Freq: Every day | ORAL | Status: DC
  Administered 2021-09-04 – 2021-09-11 (×8): 100 mg via ORAL
  Filled 2021-09-04 (×8): qty 1

## 2021-09-04 MED ORDER — ESCITALOPRAM OXALATE 10 MG PO TABS
10.0000 mg | ORAL_TABLET | Freq: Every day | ORAL | Status: DC
  Administered 2021-09-04 – 2021-09-11 (×8): 10 mg via ORAL
  Filled 2021-09-04 (×8): qty 1

## 2021-09-04 MED ORDER — LACTATED RINGERS IV SOLN: INTRAVENOUS | Status: AC

## 2021-09-04 MED ORDER — METHYLPREDNISOLONE SODIUM SUCC 125 MG IJ SOLR
80.0000 mg | INTRAMUSCULAR | Status: DC
  Administered 2021-09-04 – 2021-09-05 (×2): 80 mg via INTRAVENOUS
  Filled 2021-09-04 (×2): qty 2

## 2021-09-04 NOTE — Progress Notes (Addendum)
PROGRESS NOTE    Betty Jackson  FTD:[id number] DOB: [birth year] DOA: 09/03/2021 PCP: Alvester Morin, MD   87 y.o. female with medical history significant of CAD, hypertension, hyperlipidemia, PAF on Eliquis, CKD stage IIIa, CAD status post DES to RCA in 2005, HFpEF, CVA, rheumatoid arthritis/giant cell arteritis on chronic prednisone, anxiety/depression, chronic insomnia, chronic low back pain, GERD, osteoporosis, chronic hypoxic respiratory failure on baseline 2 L oxygen.  Recently admitted 5/9 for dysphagia and underwent EGD which revealed severe esophagitis with bleeding and gastritis.  This was felt to be medication induced esophagitis due to alendronate which was discontinued.  Went into A-fib with RVR during this hospitalization, treated with IV amiodarone and discharged on oral amiodarone, she also finished a 7-day antibiotic course for aspiration pneumonia. Patient had a hospital follow-up visit with GI on 5/30, Carafate discontinued and was advised to continue taking Protonix 40 mg twice daily, scheduled for repeat EGD on 7/17.  Seen by cardiology yesterday, dose of Lasix was increased to 80 mg daily x2 days due to lower extremity edema, then return to 40 mg daily.  Recommended continuing Eliquis 5 mg twice daily, amiodarone 200 mg daily, and diltiazem 180 mg daily -Presented to the ED 6/7 with weakness, low-grade fevers and malaise, reported feeling well until yesterday, also has had ongoing intermittent jaw pain following lower teeth extraction 4 months ago. -In the ED she was febrile to 101.3, WBC of 12.2, UA grossly abnormal with cloudy urine, positive leuk esterase, many bacteria and greater than 50 WBCs.  Chest x-ray showing stable left basilar opacities favored to be atelectasis or scarring. CT soft tissue neck with contrast showing no significant inflammatory changes or abscess.   Subjective: Feels a bit better today, no events overnight, had some nausea and abdominal discomfort  yesterday, none reported today, ongoing discomfort in the right jaw for few months  Assessment and Plan:  Sepsis secondary to UTI Meets criteria for sepsis with fever, tachycardia, and leukocytosis -Continue IV ceftriaxone, follow-up urine and blood cultures -Gentle IVF today x12 hours -Start stress dose IV steroids on account of chronic prednisone use   AKI on CKD IIIa Likely  from sepsis. BUN 29, creatinine 1.8 (baseline 0.9-1.0) -Holding Lasix and losartan in the setting of sepsis   Recent severe esophagitis -Secondary to bisphosphonates -Continue PPI given severe esophagitis and gastritis on recent EGD   Diarrhea COVID and influenza PCR negative. Abdominal exam benign. -C diff PCR and GI pathogen panel pending   Chronic diastolic CHF  Has mild pedal edema but no signs of pulmonary edema. Echo done in January 2021 showing LVEF 60 to 06%, grade 2 diastolic dysfunction. -Hold Lasix and losartan given AKI   CAD Stable,  -Continue Eliquis and statin.   PAF Stable, currently in sinus rhythm. -Continue Eliquis, Amiodarone, and Diltiazem.   Tooth pain Patient had her lower incisors extracted 4 months ago. No gum swelling or obvious signs of oral infection at this time. CT soft tissue neck with contrast showing no significant inflammatory changes or abscess. -Outpatient follow-up with her oral surgeon recommended   Rheumatoid arthritis -On prednisone 20 Mg daily at baseline, getting stress dose steroids now   Anxiety/ depression Chronic insomnia -Continue Lexapro and Seroquel   Chronic hypoxic respiratory failure on 2L O2 No change in O2 requirement from baseline. -Continue supplemental O2   Severe esophagitis Gastritis Seen on recent EGD and felt to be medication induced, alendronate was discontinued. -Continue Protonix 40 mg BID -Recently seen by GI, plan  for repeat EGD on 7/17   Multiple lung nodules -Could be secondary to rheumatoid arthritis, needs follow-up    Gout -Continue allopurinol   DVT prophylaxis: Eliquis Code Status: DNR Family Communication: Discussed patient in detail, no family at bedside Disposition Plan: Back to SNF when stable  Consultants:    Procedures:   Antimicrobials:    Objective: Vitals:   09/04/21 0647 09/04/21 0700 09/04/21 0800 09/04/21 0951  BP:  (!) 129/54 (!) 130/57   Pulse:   (!) 101   Resp:   (!) 23   Temp: 99.2 F (37.3 C)  (!) 100.9 F (38.3 C) 99.8 F (37.7 C)  TempSrc: Oral  Oral Oral  SpO2:   94%     Intake/Output Summary (Last 24 hours) at 09/04/2021 1018 Last data filed at 09/04/2021 0044 Gross per 24 hour  Intake 2625.29 ml  Output --  Net 2625.29 ml   There were no vitals filed for this visit.  Examination:  General exam: Chronically ill elderly female sitting up in bed, AAO x2, no distress, mild cognitive deficits CVS: S1-S2, regular rhythm Lungs: Rare basilar rales Abdomen: Soft, nontender, bowel sounds present Extremities: Trace edema  Skin: No rashes on exposed skin Psychiatry:  Mood & affect appropriate.     Data Reviewed:   CBC: Recent Labs  Lab 09/03/21 1303 09/04/21 0341  WBC 12.2* 12.6*  NEUTROABS 11.5*  --   HGB 11.7* 10.0*  HCT 38.7 32.5*  MCV 97.5 95.6  PLT 203 482   Basic Metabolic Panel: Recent Labs  Lab 09/03/21 1303 09/04/21 0341  NA 138 141  K 4.4 4.0  CL 99 102  CO2 27 29  GLUCOSE 188* 106*  BUN 29* 23  CREATININE 1.81* 1.54*  CALCIUM 8.3* 7.6*   GFR: Estimated Creatinine Clearance: 27.3 mL/min (A) (by C-G formula based on SCr of 1.54 mg/dL (H)). Liver Function Tests: Recent Labs  Lab 09/04/21 0341  AST 31  ALT 56*  ALKPHOS 159*  BILITOT 0.9  PROT 5.3*  ALBUMIN 2.5*   Recent Labs  Lab 09/04/21 0341  LIPASE 30   No results for input(s): "AMMONIA" in the last 168 hours. Coagulation Profile: Recent Labs  Lab 09/03/21 1843  INR 1.7*   Cardiac Enzymes: No results for input(s): "CKTOTAL", "CKMB", "CKMBINDEX", "TROPONINI"  in the last 168 hours. BNP (last 3 results) No results for input(s): "PROBNP" in the last 8760 hours. HbA1C: No results for input(s): "HGBA1C" in the last 72 hours. CBG: No results for input(s): "GLUCAP" in the last 168 hours. Lipid Profile: No results for input(s): "CHOL", "HDL", "LDLCALC", "TRIG", "CHOLHDL", "LDLDIRECT" in the last 72 hours. Thyroid Function Tests: Recent Labs    09/04/21 0341  TSH 2.630   Anemia Panel: No results for input(s): "VITAMINB12", "FOLATE", "FERRITIN", "TIBC", "IRON", "RETICCTPCT" in the last 72 hours. Urine analysis:    Component Value Date/Time   COLORURINE AMBER (A) 09/03/2021 1821   APPEARANCEUR CLOUDY (A) 09/03/2021 1821   LABSPEC 1.034 (H) 09/03/2021 1821   PHURINE 5.0 09/03/2021 1821   GLUCOSEU NEGATIVE 09/03/2021 1821   GLUCOSEU NEGATIVE 07/31/2016 1018   HGBUR SMALL (A) 09/03/2021 1821   BILIRUBINUR NEGATIVE 09/03/2021 1821   BILIRUBINUR neg 07/31/2016 1012   KETONESUR NEGATIVE 09/03/2021 1821   PROTEINUR 30 (A) 09/03/2021 1821   UROBILINOGEN 0.2 07/31/2016 1018   UROBILINOGEN 0.2 07/31/2016 1012   NITRITE NEGATIVE 09/03/2021 1821   LEUKOCYTESUR LARGE (A) 09/03/2021 1821   Sepsis Labs: '@LABRCNTIP'$ (procalcitonin:4,lacticidven:4)  ) Recent Results (from  the past 240 hour(s))  Blood Culture (routine x 2)     Status: None (Preliminary result)   Collection Time: 09/03/21  6:30 PM   Specimen: BLOOD  Result Value Ref Range Status   Specimen Description BLOOD SITE NOT SPECIFIED  Final   Special Requests   Final    BOTTLES DRAWN AEROBIC AND ANAEROBIC Blood Culture adequate volume Performed at [facility], [place] [address], [place], [place] [address]    Culture PENDING  Incomplete   Report Status PENDING  Incomplete  Resp Panel by RT-PCR (Flu A&B, Covid) Anterior Nasal Swab     Status: None   Collection Time: 09/03/21  6:59 PM   Specimen: Anterior Nasal Swab  Result Value Ref Range Status   SARS Coronavirus 2 by RT PCR NEGATIVE  NEGATIVE Final    Comment: (NOTE) SARS-CoV-2 target nucleic acids are NOT DETECTED.  The SARS-CoV-2 RNA is generally detectable in upper respiratory specimens during the acute phase of infection. The lowest concentration of SARS-CoV-2 viral copies this assay can detect is 138 copies/mL. A negative result does not preclude SARS-Cov-2 infection and should not be used as the sole basis for treatment or other patient management decisions. A negative result may occur with  improper specimen collection/handling, submission of specimen other than nasopharyngeal swab, presence of viral mutation(s) within the areas targeted by this assay, and inadequate number of viral copies(<138 copies/mL). A negative result must be combined with clinical observations, patient history, and epidemiological information. The expected result is Negative.  Fact Sheet for Patients:  EntrepreneurPulse.com.au  Fact Sheet for Healthcare Providers:  IncredibleEmployment.be  This test is no t yet approved or cleared by the [place] FDA and  has been authorized for detection and/or diagnosis of SARS-CoV-2 by FDA under an Emergency Use Authorization (EUA). This EUA will remain  in effect (meaning this test can be used) for the duration of the COVID-19 declaration under Section 564(b)(1) of the Act, 21 U.S.C.section 360bbb-3(b)(1), unless the authorization is terminated  or revoked sooner.       Influenza A by PCR NEGATIVE NEGATIVE Final   Influenza B by PCR NEGATIVE NEGATIVE Final    Comment: (NOTE) The Xpert Xpress SARS-CoV-2/FLU/RSV plus assay is intended as an aid in the diagnosis of influenza from Nasopharyngeal swab specimens and should not be used as a sole basis for treatment. Nasal washings and aspirates are unacceptable for Xpert Xpress SARS-CoV-2/FLU/RSV testing.  Fact Sheet for Patients: EntrepreneurPulse.com.au  Fact Sheet for Healthcare  Providers: IncredibleEmployment.be  This test is not yet approved or cleared by the [place] FDA and has been authorized for detection and/or diagnosis of SARS-CoV-2 by FDA under an Emergency Use Authorization (EUA). This EUA will remain in effect (meaning this test can be used) for the duration of the COVID-19 declaration under Section 564(b)(1) of the Act, 21 U.S.C. section 360bbb-3(b)(1), unless the authorization is terminated or revoked.  Performed at [facility], [place] [address], [place], [place] [address]      Radiology Studies: CT CHEST WO CONTRAST  Result Date: 09/04/2021 CLINICAL DATA:  Questionable sepsis. EXAM: CT CHEST WITHOUT CONTRAST TECHNIQUE: Multidetector CT imaging of the chest was performed following the standard protocol without IV contrast. RADIATION DOSE REDUCTION: This exam was performed according to the departmental dose-optimization program which includes automated exposure control, adjustment of the mA and/or kV according to patient size and/or use of iterative reconstruction technique. COMPARISON:  Chest radiograph dated 09/03/2021. Chest CT dated 04/15/2019. FINDINGS:  Evaluation of this exam is limited in the absence of intravenous contrast. Cardiovascular: There is no cardiomegaly or pericardial effusion. Three-vessel coronary vascular calcification. Mild atherosclerotic calcification of the thoracic aorta. No aneurysmal dilatation. The central pulmonary arteries are grossly unremarkable. Mediastinum/Nodes: Left hilar and mediastinal calcified lymph node or granuloma. Small hiatal hernia. The esophagus is grossly unremarkable. No mediastinal fluid collection. Lungs/Pleura: Left lung base patchy atelectasis. Pneumonia is not excluded. No large pleural effusion. No pneumothorax. The central airways are patent. A 4 mm right lower lobe nodule (74/6) present on the prior CT. A 5 mm nodule in the right middle lobe (79/6). Additional 4 mm right  lower lobe nodule (80/6). Upper Abdomen: Cholecystectomy.  Scattered colonic diverticula. Musculoskeletal: Osteopenia with degenerative changes of the spine. Age indeterminate T7 compression fracture with approximately 50% loss of vertebral body height, new since the prior CT. Correlation with clinical exam and point tenderness recommended. T11 and L1 vertebroplasty changes. IMPRESSION: 1. Left lung base patchy atelectasis. Pneumonia is not excluded. 2. Several pulmonary nodules measure up to 5 mm. No follow-up needed if patient is low-risk (and has no known or suspected primary neoplasm). Non-contrast chest CT can be considered in 12 months if patient is high-risk. This recommendation follows the consensus statement: Guidelines for Management of Incidental Pulmonary Nodules Detected on CT Images: From the Fleischner Society 2017; Radiology 2017; 284:228-243. 3. Age indeterminate T7 compression fracture, new since the prior CT. Correlation with clinical exam and point tenderness recommended. 4. Aortic Atherosclerosis (ICD10-I70.0). Electronically Signed   By: Anner Crete M.D.   On: 09/04/2021 03:40   DG Chest Port 1 View  Result Date: 09/03/2021 CLINICAL DATA:  Questionable sepsis. EXAM: PORTABLE CHEST 1 VIEW COMPARISON:  Chest x-ray 08/06/2021. CT abdomen and pelvis 07/17/2020. FINDINGS: There is some stable linear and patchy airspace opacity in the left lower lung. One area is slightly nodular. Lungs are otherwise clear. No pleural effusion or pneumothorax. Cardiomediastinal silhouette is within normal limits. IMPRESSION: 1. Stable left basilar opacities favored is atelectasis or scarring. One area is slightly nodular. A follow-up chest CT should be considered. 2. No new focal lung infiltrate. Electronically Signed   By: Ronney Asters M.D.   On: 09/03/2021 19:04   CT Soft Tissue Neck W Contrast  Result Date: 09/03/2021 CLINICAL DATA:  Soft tissue swelling, infection suspected; Post-operative lower  gumline pain near incisor sites, swelling - evaluate for underlying tracking abscess (lower mouth) EXAM: CT NECK WITH CONTRAST TECHNIQUE: Multidetector CT imaging of the neck was performed using the standard protocol following the bolus administration of intravenous contrast. RADIATION DOSE REDUCTION: This exam was performed according to the departmental dose-optimization program which includes automated exposure control, adjustment of the mA and/or kV according to patient size and/or use of iterative reconstruction technique. CONTRAST:  12m OMNIPAQUE IOHEXOL 300 MG/ML  SOLN COMPARISON:  None Available. FINDINGS: Motion artifact is present at some levels. Pharynx and larynx: Unremarkable. No mass or swelling within limitation of streak artifact from dental amalgam. Salivary glands: Parotid and submandibular glands are unremarkable. Thyroid: Unremarkable. Lymph nodes: No enlarged or abnormal density nodes. Vascular: Major neck vessels are patent. Calcified plaque at the common carotid bifurcations. Limited intracranial: No abnormal enhancement. Visualized orbits: Bilateral lens replacements. Mastoids and visualized paranasal sinuses: Mild mucosal thickening. Mastoid air cells are clear. Skeleton: Advanced degenerative changes of the cervical spine. Upper chest: No apical lung mass. Other: None. IMPRESSION: No significant inflammatory changes identified.  No abscess. Electronically Signed   By: PMalachi Carl  Patel M.D.   On: 09/03/2021 17:57     Scheduled Meds:  allopurinol  100 mg Oral Daily   amiodarone  200 mg Oral Daily   apixaban  5 mg Oral BID   atorvastatin  80 mg Oral QHS   diltiazem  180 mg Oral Daily   escitalopram  10 mg Oral Daily   methylPREDNISolone (SOLU-MEDROL) injection  80 mg Intravenous Q24H   pantoprazole  40 mg Oral BID   QUEtiapine  25 mg Oral QHS   Continuous Infusions:  cefTRIAXone (ROCEPHIN)  IV Stopped (09/03/21 1938)   lactated ringers 75 mL/hr at 09/04/21 0049     LOS: 1 day     Time spent: 32mn    PDomenic Polite MD [facility]   09/04/2021, 10:18 AM

## 2021-09-04 NOTE — Progress Notes (Signed)
NEW ADMISSION NOTE New Admission Note:   Arrival Method: stretcher Mental Orientation:  vA&O x4 Telemetry: Box 18 Assessment: Completed Skin:aseeseed IV: Saline locked Pain: zero Tubes: none Safety Measures: Safety Fall Prevention Plan has been given, discussed and signed Admission: Completed [facility] Orientation: Patient has been orientated to the room, unit and staff.  Family: bedside  Orders have been reviewed and implemented. Will continue to monitor the patient. Call light has been placed within reach and bed alarm has been activated.   Berneta Levins, RN

## 2021-09-04 NOTE — ED Notes (Signed)
[name] daughter [phone] requesting an update on the patient

## 2021-09-04 NOTE — ED Notes (Signed)
Patient transported to CT 

## 2021-09-04 NOTE — Plan of Care (Signed)
  Problem: Clinical Measurements: Goal: Respiratory complications will improve Outcome: Progressing Goal: Cardiovascular complication will be avoided Outcome: Progressing   

## 2021-09-04 NOTE — ED Notes (Signed)
Admitting provider at bedside.

## 2021-09-05 DIAGNOSIS — A419 Sepsis, unspecified organism: Secondary | ICD-10-CM | POA: Diagnosis not present

## 2021-09-05 DIAGNOSIS — N390 Urinary tract infection, site not specified: Secondary | ICD-10-CM | POA: Diagnosis not present

## 2021-09-05 LAB — CBC
HCT: 29.7 % — ABNORMAL LOW (ref 36.0–46.0)
Hemoglobin: 9.2 g/dL — ABNORMAL LOW (ref 12.0–15.0)
MCH: 28.9 pg (ref 26.0–34.0)
MCHC: 31 g/dL (ref 30.0–36.0)
MCV: 93.4 fL (ref 80.0–100.0)
Platelets: 199 10*3/uL (ref 150–400)
RBC: 3.18 MIL/uL — ABNORMAL LOW (ref 3.87–5.11)
RDW: 14.7 % (ref 11.5–15.5)
WBC: 14.3 10*3/uL — ABNORMAL HIGH (ref 4.0–10.5)
nRBC: 0 % (ref 0.0–0.2)

## 2021-09-05 LAB — COMPREHENSIVE METABOLIC PANEL
ALT: 41 U/L (ref 0–44)
AST: 22 U/L (ref 15–41)
Albumin: 2.2 g/dL — ABNORMAL LOW (ref 3.5–5.0)
Alkaline Phosphatase: 125 U/L (ref 38–126)
Anion gap: 8 (ref 5–15)
BUN: 26 mg/dL — ABNORMAL HIGH (ref 8–23)
CO2: 27 mmol/L (ref 22–32)
Calcium: 7.4 mg/dL — ABNORMAL LOW (ref 8.9–10.3)
Chloride: 104 mmol/L (ref 98–111)
Creatinine, Ser: 1.45 mg/dL — ABNORMAL HIGH (ref 0.44–1.00)
GFR, Estimated: 35 mL/min — ABNORMAL LOW (ref >60)
Glucose, Bld: 120 mg/dL — ABNORMAL HIGH (ref 70–99)
Potassium: 3.7 mmol/L (ref 3.5–5.1)
Sodium: 139 mmol/L (ref 135–145)
Total Bilirubin: 0.4 mg/dL (ref 0.3–1.2)
Total Protein: 4.8 g/dL — ABNORMAL LOW (ref 6.5–8.1)

## 2021-09-05 MED ORDER — POTASSIUM CHLORIDE CRYS ER 20 MEQ PO TBCR
40.0000 meq | EXTENDED_RELEASE_TABLET | Freq: Two times a day (BID) | ORAL | Status: DC
  Administered 2021-09-05 (×2): 40 meq via ORAL
  Filled 2021-09-05 (×3): qty 2

## 2021-09-05 MED ORDER — HYDROCODONE-ACETAMINOPHEN 5-325 MG PO TABS
1.0000 | ORAL_TABLET | Freq: Two times a day (BID) | ORAL | Status: DC | PRN
  Administered 2021-09-05 – 2021-09-10 (×8): 1 via ORAL
  Filled 2021-09-05 (×9): qty 1

## 2021-09-05 MED ORDER — METHYLPREDNISOLONE SODIUM SUCC 40 MG IJ SOLR: 40.0000 mg | INTRAMUSCULAR | Status: DC

## 2021-09-05 MED ORDER — FENTANYL 25 MCG/HR TD PT72
1.0000 | MEDICATED_PATCH | TRANSDERMAL | Status: DC
  Administered 2021-09-05 – 2021-09-11 (×3): 1 via TRANSDERMAL
  Filled 2021-09-05 (×3): qty 1

## 2021-09-05 MED ORDER — FENTANYL 12 MCG/HR TD PT72
1.0000 | MEDICATED_PATCH | TRANSDERMAL | Status: DC
  Administered 2021-09-05 – 2021-09-11 (×3): 1 via TRANSDERMAL
  Filled 2021-09-05 (×2): qty 1

## 2021-09-05 MED ORDER — FUROSEMIDE 10 MG/ML IJ SOLN
40.0000 mg | Freq: Two times a day (BID) | INTRAMUSCULAR | Status: DC
Start: 2021-09-05 — End: 2021-09-06
  Administered 2021-09-05 – 2021-09-06 (×2): 40 mg via INTRAVENOUS
  Filled 2021-09-05 (×2): qty 4

## 2021-09-05 NOTE — Plan of Care (Signed)
  Problem: Clinical Measurements: Goal: Respiratory complications will improve Outcome: Progressing Goal: Cardiovascular complication will be avoided Outcome: Progressing   Problem: Nutrition: Goal: Adequate nutrition will be maintained Outcome: Progressing   Problem: Elimination: Goal: Will not experience complications related to bowel motility Outcome: Progressing Goal: Will not experience complications related to urinary retention Outcome: Progressing   

## 2021-09-05 NOTE — Progress Notes (Signed)
PROGRESS NOTE    TARA RUD  ONG:[id number] DOB: 1934/09/06 DOA: 09/03/2021 PCP: Alvester Morin, MD   87 y.o. female with medical history significant of CAD, hypertension, hyperlipidemia, PAF on Eliquis, CKD stage IIIa, CAD status post DES to RCA in 2005, HFpEF, CVA, rheumatoid arthritis/giant cell arteritis on chronic prednisone, anxiety/depression, chronic insomnia, chronic low back pain, GERD, osteoporosis, chronic hypoxic respiratory failure on baseline 2 L oxygen.  Recently admitted 5/9 for dysphagia and severe esophagitis with bleeding secondary to alendronate which was discontinued, also developed A-fib RVR and aspiration pneumonia . Seen in cardiology clinic 1 day PTA, dose of Lasix was increased to 80 mg daily x2 days due to lower extremity edema. -Presented to the ED 6/7 with weakness, low-grade fevers and malaise, reported feeling well until yesterday, also has had ongoing intermittent jaw pain following lower teeth extraction 4 months ago. -In the ED she was febrile to 101.3, WBC of 12.2, UA grossly abnormal with cloudy urine, positive leuk esterase, many bacteria and greater than 50 WBCs.  Chest x-ray showing stable left basilar opacities favored to be atelectasis or scarring. CT soft tissue neck with contrast showing no significant inflammatory changes or abscess.   Subjective: -Feels better today, denies any new problems, mild cough, no nausea or vomiting, no further diarrhea  Assessment and Plan:  Sepsis secondary to UTI Meets criteria for sepsis with fever, tachycardia, and leukocytosis -Clinically improving, continue IV ceftriaxone, urine cultures with E. coli, blood cultures negative X 2 days -Discontinue IV fluids -Cut down stress dose steroids, transition to prednisone tomorrow, on 10 Mg daily at baseline   AKI on CKD IIIa Likely  from sepsis. BUN 29, creatinine 1.8 (baseline 0.9-1.0) -Improving, hold losartan, restart Lasix today   Recent severe  esophagitis -Secondary to bisphosphonates -Continue PPI given severe esophagitis and gastritis on recent EGD   Diarrhea -Resolved, likely secondary to recent antibiotic use, do not suspect C. difficile -Abdominal exam is benign -C diff PCR and GI pathogen panel pending   Acute on chronic diastolic CHF  -Appears mildly volume overloaded secondary to fluid resuscitation for sepsis yesterday, echo done in January 2021 showing LVEF 60 to 44%, grade 2 diastolic dysfunction. -Hold losartan, restart Lasix today   CAD Stable,  -Continue Eliquis and statin.   PAF Stable, currently in sinus rhythm. -Continue Eliquis, Amiodarone, and Diltiazem.   Tooth pain Patient had her lower incisors extracted 4 months ago. No gum swelling or obvious signs of oral infection at this time. CT soft tissue neck with contrast showing no significant inflammatory changes or abscess. -Outpatient follow-up with her oral surgeon recommended   Rheumatoid arthritis -On prednisone 10 Mg daily at baseline, getting stress dose steroids now   Anxiety/ depression Chronic insomnia -Continue Lexapro and Seroquel   Chronic hypoxic respiratory failure on 2L O2 No change in O2 requirement from baseline. -Continue supplemental O2   Severe esophagitis Gastritis Seen on recent EGD and felt to be medication induced, alendronate was discontinued. -Continue Protonix 40 mg BID -Recently seen by GI, plan for repeat EGD on 7/17   Multiple lung nodules -Could be secondary to rheumatoid arthritis, needs follow-up   Gout -Continue allopurinol  Chronic pain -On fentanyl 37.5 mcg and hydrocodone at baseline, resumed   DVT prophylaxis: Eliquis Code Status: DNR Family Communication: Discussed patient in detail, no family at bedside Disposition Plan: Back to SNF when stable, likely Monday  Consultants:    Procedures:   Antimicrobials:    Objective: Vitals:   09/04/21  2232 09/05/21 0436 09/05/21 0850 09/05/21 0855   BP: (!) 114/53 (!) 117/59 (!) 153/69 (!) 153/69  Pulse: 75 71  94  Resp: '18 16  16  '$ Temp: 98.5 F (36.9 C) 98.2 F (36.8 C)  97.8 F (36.6 C)  TempSrc: Oral   Oral  SpO2: 93% 94%  97%  Weight:      Height:        Intake/Output Summary (Last 24 hours) at 09/05/2021 1356 Last data filed at 09/05/2021 1300 Gross per 24 hour  Intake 240 ml  Output 1125 ml  Net -885 ml   Filed Weights   09/04/21 1424  Weight: 82.7 kg    Examination:  General exam: Elderly chronically ill female sitting up in bed, AAOx3, no distress, mild cognitive deficits CVS: S1-S2, regular rhythm Lungs: Few basilar rales Abdomen: Soft, nontender, bowel sounds present Extremities: Trace edema  Skin: No rashes on exposed skin Psychiatry:  Mood & affect appropriate.     Data Reviewed:   CBC: Recent Labs  Lab 09/03/21 1303 09/04/21 0341 09/05/21 0325  WBC 12.2* 12.6* 14.3*  NEUTROABS 11.5*  --   --   HGB 11.7* 10.0* 9.2*  HCT 38.7 32.5* 29.7*  MCV 97.5 95.6 93.4  PLT 203 203 470   Basic Metabolic Panel: Recent Labs  Lab 09/03/21 1303 09/04/21 0341 09/05/21 0325  NA 138 141 139  K 4.4 4.0 3.7  CL 99 102 104  CO2 '27 29 27  '$ GLUCOSE 188* 106* 120*  BUN 29* 23 26*  CREATININE 1.81* 1.54* 1.45*  CALCIUM 8.3* 7.6* 7.4*   GFR: Estimated Creatinine Clearance: 29.8 mL/min (A) (by C-G formula based on SCr of 1.45 mg/dL (H)). Liver Function Tests: Recent Labs  Lab 09/04/21 0341 09/05/21 0325  AST 31 22  ALT 56* 41  ALKPHOS 159* 125  BILITOT 0.9 0.4  PROT 5.3* 4.8*  ALBUMIN 2.5* 2.2*   Recent Labs  Lab 09/04/21 0341  LIPASE 30   No results for input(s): "AMMONIA" in the last 168 hours. Coagulation Profile: Recent Labs  Lab 09/03/21 1843  INR 1.7*   Cardiac Enzymes: No results for input(s): "CKTOTAL", "CKMB", "CKMBINDEX", "TROPONINI" in the last 168 hours. BNP (last 3 results) No results for input(s): "PROBNP" in the last 8760 hours. HbA1C: No results for input(s): "HGBA1C"  in the last 72 hours. CBG: No results for input(s): "GLUCAP" in the last 168 hours. Lipid Profile: No results for input(s): "CHOL", "HDL", "LDLCALC", "TRIG", "CHOLHDL", "LDLDIRECT" in the last 72 hours. Thyroid Function Tests: Recent Labs    09/04/21 0341  TSH 2.630   Anemia Panel: No results for input(s): "VITAMINB12", "FOLATE", "FERRITIN", "TIBC", "IRON", "RETICCTPCT" in the last 72 hours. Urine analysis:    Component Value Date/Time   COLORURINE AMBER (A) 09/03/2021 1821   APPEARANCEUR CLOUDY (A) 09/03/2021 1821   LABSPEC 1.034 (H) 09/03/2021 1821   PHURINE 5.0 09/03/2021 1821   GLUCOSEU NEGATIVE 09/03/2021 1821   GLUCOSEU NEGATIVE 07/31/2016 1018   HGBUR SMALL (A) 09/03/2021 1821   BILIRUBINUR NEGATIVE 09/03/2021 1821   BILIRUBINUR neg 07/31/2016 1012   KETONESUR NEGATIVE 09/03/2021 1821   PROTEINUR 30 (A) 09/03/2021 1821   UROBILINOGEN 0.2 07/31/2016 1018   UROBILINOGEN 0.2 07/31/2016 1012   NITRITE NEGATIVE 09/03/2021 1821   LEUKOCYTESUR LARGE (A) 09/03/2021 1821   Sepsis Labs: '@LABRCNTIP'$ (procalcitonin:4,lacticidven:4)  ) Recent Results (from the past 240 hour(s))  Blood Culture (routine x 2)     Status: None (Preliminary result)   Collection Time:  09/03/21  6:30 PM   Specimen: BLOOD  Result Value Ref Range Status   Specimen Description BLOOD SITE NOT SPECIFIED  Final   Special Requests   Final    BOTTLES DRAWN AEROBIC AND ANAEROBIC Blood Culture adequate volume   Culture   Final    NO GROWTH 2 DAYS Performed at [facility], [address], [place], [place] [address]    Report Status PENDING  Incomplete  Blood Culture (routine x 2)     Status: None (Preliminary result)   Collection Time: 09/03/21  6:43 PM   Specimen: BLOOD RIGHT HAND  Result Value Ref Range Status   Specimen Description BLOOD RIGHT HAND  Final   Special Requests   Final    BOTTLES DRAWN AEROBIC AND ANAEROBIC Blood Culture results may not be optimal due to an inadequate volume of  blood received in culture bottles   Culture   Final    NO GROWTH 2 DAYS Performed at [facility], [place] [address] [place], [place] [address]    Report Status PENDING  Incomplete  Resp Panel by RT-PCR (Flu A&B, Covid) Anterior Nasal Swab     Status: None   Collection Time: 09/03/21  6:59 PM   Specimen: Anterior Nasal Swab  Result Value Ref Range Status   SARS Coronavirus 2 by RT PCR NEGATIVE NEGATIVE Final    Comment: (NOTE) SARS-CoV-2 target nucleic acids are NOT DETECTED.  The SARS-CoV-2 RNA is generally detectable in upper respiratory specimens during the acute phase of infection. The lowest concentration of SARS-CoV-2 viral copies this assay can detect is 138 copies/mL. A negative result does not preclude SARS-Cov-2 infection and should not be used as the sole basis for treatment or other patient management decisions. A negative result may occur with  improper specimen collection/handling, submission of specimen other than nasopharyngeal swab, presence of viral mutation(s) within the areas targeted by this assay, and inadequate number of viral copies(<138 copies/mL). A negative result must be combined with clinical observations, patient history, and epidemiological information. The expected result is Negative.  Fact Sheet for Patients:  EntrepreneurPulse.com.au  Fact Sheet for Healthcare Providers:  IncredibleEmployment.be  This test is no t yet approved or cleared by the [place] FDA and  has been authorized for detection and/or diagnosis of SARS-CoV-2 by FDA under an Emergency Use Authorization (EUA). This EUA will remain  in effect (meaning this test can be used) for the duration of the COVID-19 declaration under Section 564(b)(1) of the Act, 21 U.S.C.section 360bbb-3(b)(1), unless the authorization is terminated  or revoked sooner.       Influenza A by PCR NEGATIVE NEGATIVE Final   Influenza B by PCR NEGATIVE NEGATIVE  Final    Comment: (NOTE) The Xpert Xpress SARS-CoV-2/FLU/RSV plus assay is intended as an aid in the diagnosis of influenza from Nasopharyngeal swab specimens and should not be used as a sole basis for treatment. Nasal washings and aspirates are unacceptable for Xpert Xpress SARS-CoV-2/FLU/RSV testing.  Fact Sheet for Patients: EntrepreneurPulse.com.au  Fact Sheet for Healthcare Providers: IncredibleEmployment.be  This test is not yet approved or cleared by the [place] FDA and has been authorized for detection and/or diagnosis of SARS-CoV-2 by FDA under an Emergency Use Authorization (EUA). This EUA will remain in effect (meaning this test can be used) for the duration of the COVID-19 declaration under Section 564(b)(1) of the Act, 21 U.S.C. section 360bbb-3(b)(1), unless the authorization is terminated or revoked.  Performed at [facility], [place] [address] [place], [place] [address]   Urine Culture     Status: Abnormal (Preliminary result)   Collection Time: 09/03/21  7:05 PM   Specimen: In/Out Cath Urine  Result Value Ref Range Status   Specimen Description IN/OUT CATH URINE  Final   Special Requests NONE  Final   Culture (A)  Final    >=100,000 COLONIES/mL ESCHERICHIA COLI SUSCEPTIBILITIES TO FOLLOW Performed at [facility], [place] [address], [place], [place] [address]    Report Status PENDING  Incomplete     Radiology Studies: CT CHEST WO CONTRAST  Result Date: 09/04/2021 CLINICAL DATA:  Questionable sepsis. EXAM: CT CHEST WITHOUT CONTRAST TECHNIQUE: Multidetector CT imaging of the chest was performed following the standard protocol without IV contrast. RADIATION DOSE REDUCTION: This exam was performed according to the departmental dose-optimization program which includes automated exposure control, adjustment of the mA and/or kV according to patient size and/or use of iterative reconstruction technique. COMPARISON:   Chest radiograph dated 09/03/2021. Chest CT dated 04/15/2019. FINDINGS: Evaluation of this exam is limited in the absence of intravenous contrast. Cardiovascular: There is no cardiomegaly or pericardial effusion. Three-vessel coronary vascular calcification. Mild atherosclerotic calcification of the thoracic aorta. No aneurysmal dilatation. The central pulmonary arteries are grossly unremarkable. Mediastinum/Nodes: Left hilar and mediastinal calcified lymph node or granuloma. Small hiatal hernia. The esophagus is grossly unremarkable. No mediastinal fluid collection. Lungs/Pleura: Left lung base patchy atelectasis. Pneumonia is not excluded. No large pleural effusion. No pneumothorax. The central airways are patent. A 4 mm right lower lobe nodule (74/6) present on the prior CT. A 5 mm nodule in the right middle lobe (79/6). Additional 4 mm right lower lobe nodule (80/6). Upper Abdomen: Cholecystectomy.  Scattered colonic diverticula. Musculoskeletal: Osteopenia with degenerative changes of the spine. Age indeterminate T7 compression fracture with approximately 50% loss of vertebral body height, new since the prior CT. Correlation with clinical exam and point tenderness recommended. T11 and L1 vertebroplasty changes. IMPRESSION: 1. Left lung base patchy atelectasis. Pneumonia is not excluded. 2. Several pulmonary nodules measure up to 5 mm. No follow-up needed if patient is low-risk (and has no known or suspected primary neoplasm). Non-contrast chest CT can be considered in 12 months if patient is high-risk. This recommendation follows the consensus statement: Guidelines for Management of Incidental Pulmonary Nodules Detected on CT Images: From the Fleischner Society 2017; Radiology 2017; 284:228-243. 3. Age indeterminate T7 compression fracture, new since the prior CT. Correlation with clinical exam and point tenderness recommended. 4. Aortic Atherosclerosis (ICD10-I70.0). Electronically Signed   By: Anner Crete M.D.   On: 09/04/2021 03:40   DG Chest Port 1 View  Result Date: 09/03/2021 CLINICAL DATA:  Questionable sepsis. EXAM: PORTABLE CHEST 1 VIEW COMPARISON:  Chest x-ray 08/06/2021. CT abdomen and pelvis 07/17/2020. FINDINGS: There is some stable linear and patchy airspace opacity in the left lower lung. One area is slightly nodular. Lungs are otherwise clear. No pleural effusion or pneumothorax. Cardiomediastinal silhouette is within normal limits. IMPRESSION: 1. Stable left basilar opacities favored is atelectasis or scarring. One area is slightly nodular. A follow-up chest CT should be considered. 2. No new focal lung infiltrate. Electronically Signed   By: Ronney Asters M.D.   On: 09/03/2021 19:04   CT Soft Tissue Neck W Contrast  Result Date: 09/03/2021 CLINICAL DATA:  Soft tissue swelling, infection suspected; Post-operative lower gumline pain near incisor sites, swelling - evaluate for underlying tracking abscess (lower mouth) EXAM: CT NECK  WITH CONTRAST TECHNIQUE: Multidetector CT imaging of the neck was performed using the standard protocol following the bolus administration of intravenous contrast. RADIATION DOSE REDUCTION: This exam was performed according to the departmental dose-optimization program which includes automated exposure control, adjustment of the mA and/or kV according to patient size and/or use of iterative reconstruction technique. CONTRAST:  45m OMNIPAQUE IOHEXOL 300 MG/ML  SOLN COMPARISON:  None Available. FINDINGS: Motion artifact is present at some levels. Pharynx and larynx: Unremarkable. No mass or swelling within limitation of streak artifact from dental amalgam. Salivary glands: Parotid and submandibular glands are unremarkable. Thyroid: Unremarkable. Lymph nodes: No enlarged or abnormal density nodes. Vascular: Major neck vessels are patent. Calcified plaque at the common carotid bifurcations. Limited intracranial: No abnormal enhancement. Visualized orbits:  Bilateral lens replacements. Mastoids and visualized paranasal sinuses: Mild mucosal thickening. Mastoid air cells are clear. Skeleton: Advanced degenerative changes of the cervical spine. Upper chest: No apical lung mass. Other: None. IMPRESSION: No significant inflammatory changes identified.  No abscess. Electronically Signed   By: PMacy MisM.D.   On: 09/03/2021 17:57     Scheduled Meds:  allopurinol  100 mg Oral Daily   amiodarone  200 mg Oral Daily   apixaban  5 mg Oral BID   atorvastatin  80 mg Oral QHS   diltiazem  180 mg Oral Daily   escitalopram  10 mg Oral Daily   fentaNYL  1 patch Transdermal Q72H   fentaNYL  1 patch Transdermal Q72H   furosemide  40 mg Intravenous Q12H   [START ON 09/06/2021] methylPREDNISolone (SOLU-MEDROL) injection  40 mg Intravenous Q24H   pantoprazole  40 mg Oral BID   potassium chloride  40 mEq Oral BID   QUEtiapine  25 mg Oral QHS   Continuous Infusions:  cefTRIAXone (ROCEPHIN)  IV 2 g (09/04/21 1827)     LOS: 2 days    Time spent: 332m    PrDomenic PoliteMD [facility]   09/05/2021, 1:56 PM

## 2021-09-05 NOTE — TOC Initial Note (Signed)
Transition of Care [facility]) - Initial/Assessment Note    Patient Details  Name: Betty Jackson MRN: [id number] Date of Birth: 1934-07-01  Transition of Care [facility]) CM/SW Contact:    Milinda Antis, [place] Phone Number: 09/05/2021, 4:07 PM  Clinical Narrative:                 CSW received notification that the patient is from a facility.  CSW spoke with the patient who was unable to provide the name of the facility and gave CSW permission to speak with her daughter.  CSW contacted the patient's daughter, [name]. The patient is from [facility] (SNF) in [place] and the plan is for the patient to return at d/c.  CSW contacted Tiburcio Bash with admissions at the facility. [phone])  The patient is LT and can return when medically ready.  TOC will continue to follow.    Expected Discharge Plan: Skilled Nursing Facility Barriers to Discharge: Continued Medical Work up   Patient Goals and CMS Choice Patient states their goals for this hospitalization and ongoing recovery are:: to return to rehab      Expected Discharge Plan and Services Expected Discharge Plan: [place] arrangements for the past 2 months: [place]                                      Prior Living Arrangements/Services Living arrangements for the past 2 months: [place] Lives with:: Facility Resident Patient language and need for interpreter reviewed:: Yes Do you feel safe going back to the place where you live?: Yes      Need for Family Participation in Patient Care: Yes (Comment) Care giver support system in place?: Yes (comment)   Criminal Activity/Legal Involvement Pertinent to Current Situation/Hospitalization: No - Comment as needed  Activities of Daily Living Home Assistive Devices/Equipment: Environmental consultant (specify type), Wheelchair, Grab bars in shower, Grab bars around toilet, Hand-held shower hose, Shower chair with back ADL  Screening (condition at time of admission) Patient's cognitive ability adequate to safely complete daily activities?: Yes Is the patient deaf or have difficulty hearing?: No Does the patient have difficulty seeing, even when wearing glasses/contacts?: No Does the patient have difficulty concentrating, remembering, or making decisions?: No Patient able to express need for assistance with ADLs?: Yes Does the patient have difficulty dressing or bathing?: Yes Independently performs ADLs?: No Communication: Independent Dressing (OT): Needs assistance Is this a change from baseline?: Pre-admission baseline Grooming: Needs assistance Is this a change from baseline?: Pre-admission baseline Feeding: Independent Bathing: Needs assistance Is this a change from baseline?: Pre-admission baseline Toileting: Needs assistance Is this a change from baseline?: Pre-admission baseline In/Out Bed: Needs assistance Is this a change from baseline?: Pre-admission baseline Walks in Home: Needs assistance Is this a change from baseline?: Pre-admission baseline Does the patient have difficulty walking or climbing stairs?: Yes Weakness of Legs: Both Weakness of Arms/Hands: Both  Permission Sought/Granted Permission sought to share information with : Other (comment) (CSW)    Share Information with NAME: daughter [name]  Permission granted to share info w AGENCY: SNF        Emotional Assessment   Attitude/Demeanor/Rapport: Engaged   Orientation: : Oriented to Self, Oriented to Place Alcohol / Substance Use: Not Applicable Psych Involvement: No (comment)  Admission diagnosis:  Acute kidney injury ([place]) [N17.9] Sepsis ([place]) [A41.9]  Severe sepsis ([place]) [A41.9, R65.20] Urinary tract infection without hematuria, site unspecified [N39.0] Patient Active Problem List   Diagnosis Date Noted   Lung nodules 09/04/2021   Sepsis secondary to UTI ([place]) 09/03/2021   Atrial fibrillation with RVR ([place])  08/06/2021   Dysphagia 16/12/9602   Acute metabolic encephalopathy 54/11/8117   Hypoxemia 03/15/2021   Acute kidney injury superimposed on chronic kidney disease ([place]) 03/15/2021   Stroke-like symptoms 07/14/2020   Cough 05/24/2020   Dyspnea 05/24/2020   Optic neuritis 05/24/2020   Osteopenia 05/24/2020   Rash 05/24/2020   Vitamin D deficiency 05/24/2020   Palliative care by specialist    Goals of care, counseling/discussion    DNR (do not resuscitate)    Weakness    Advanced directives, counseling/discussion    Pressure injury of skin 10/18/2019   Closed fracture of right distal femur ([place]) 10/17/2019   Femur fracture, right ([place]) 09/25/2019   Chronic diastolic CHF (congestive heart failure) ([place]) 09/25/2019   HCAP (healthcare-associated pneumonia) 06/19/2019   Encephalopathy 06/19/2019   Lumbar degenerative disc disease    Paroxysmal A-fib ([place])    Hypomagnesemia    C. difficile colitis    Diarrhea 05/29/2019   CHF (congestive heart failure) ([place]) 05/29/2019   Recurrent falls 04/30/2019   Pericardial effusion without cardiac tamponade 03/29/2019   Hypoalbuminemia 03/27/2019   Cardiac tamponade    Pericarditis 03/06/2019   Fever    Septic shock ([place])    Atypical chest pain    Atrial fibrillation with rapid ventricular response (HCC)    Nausea and vomiting 01/17/2019   Acute diverticulitis 01/13/2019   Chronic back pain 01/13/2019   Tachycardia 01/13/2019   Hypokalemia 01/13/2019   Chronic low back pain 07/10/2018   Influenza A 05/20/2017   Severe sepsis ([place]) 05/20/2017   Acute respiratory failure with hypoxia ([place]) 05/20/2017   CKD (chronic kidney disease), stage III ([place]) 05/20/2017   PAF (paroxysmal atrial fibrillation) ([place]) 05/20/2017   HTN (hypertension) 05/20/2017   Giant cell arteritis ([place]) 05/20/2017   Coronary disease 05/20/2017   Abnormal urinalysis 05/20/2017   Osteoporosis 05/08/2016   Herniated nucleus pulposus, L5-S1, right 11/04/2015   Major  depressive disorder, recurrent episode, moderate ([place]) 07/16/2013   Abdominal pain, epigastric 06/22/2013   Lumbar stenosis 04/25/2013   Chronic insomnia 02/07/2013   Elevated transaminase level 02/07/2013   CVA (cerebral infarction) 10/29/2012   Visual changes 10/28/2012   Depression 10/28/2012   Anxiety state 10/28/2012   Anemia, unspecified 10/28/2012   Nonspecific (abnormal) findings on radiological and other examination of gastrointestinal tract 01/25/2012   Hyponatremia 01/24/2012   Hematuria 01/24/2012   Enteritis 12/24/2011   Abdominal pain, other specified site 12/24/2011   Leg pain, right 02/18/2011   Rheumatoid arthritis ([place]) 02/04/2011   Temporal arteritis ([place]) 09/01/2010   Hypertension 09/01/2010   Hyperlipidemia 09/01/2010   History of atrial fibrillation 09/01/2010   CAD (coronary artery disease) 09/01/2010   GERD (gastroesophageal reflux disease) 09/01/2010   Insomnia 09/01/2010   PCP:  Alvester Morin, MD Pharmacy:  No Pharmacies Listed    Social Determinants of Health (SDOH) Interventions    Readmission Risk Interventions    10/30/2019    9:48 AM 09/26/2019    4:29 PM 06/21/2019    4:12 PM  Readmission Risk Prevention Plan  Transportation Screening Complete Complete Complete  Medication Review (RN Care Manager) Complete Complete Complete  PCP or Specialist appointment within 3-5 days of discharge Complete Complete Not Complete  PCP/Specialist Appt Not Complete comments  likely SNF placement  HRI or Home Care Consult Complete Complete Not Complete  HRI or Home Care Consult Pt Refusal Comments   likely SNF  SW Recovery Care/Counseling Consult Complete Complete Complete  Palliative Care Screening Complete Complete Complete  Skilled Nursing Facility Not Applicable Complete Complete

## 2021-09-06 DIAGNOSIS — N390 Urinary tract infection, site not specified: Secondary | ICD-10-CM | POA: Diagnosis not present

## 2021-09-06 DIAGNOSIS — A419 Sepsis, unspecified organism: Secondary | ICD-10-CM | POA: Diagnosis not present

## 2021-09-06 LAB — BASIC METABOLIC PANEL
Anion gap: 11 (ref 5–15)
BUN: 29 mg/dL — ABNORMAL HIGH (ref 8–23)
CO2: 22 mmol/L (ref 22–32)
Calcium: 7.6 mg/dL — ABNORMAL LOW (ref 8.9–10.3)
Chloride: 103 mmol/L (ref 98–111)
Creatinine, Ser: 1.56 mg/dL — ABNORMAL HIGH (ref 0.44–1.00)
GFR, Estimated: 32 mL/min — ABNORMAL LOW (ref >60)
Glucose, Bld: 147 mg/dL — ABNORMAL HIGH (ref 70–99)
Potassium: 4.7 mmol/L (ref 3.5–5.1)
Sodium: 136 mmol/L (ref 135–145)

## 2021-09-06 LAB — URINE CULTURE: Culture: >=100000 — AB

## 2021-09-06 LAB — CBC
HCT: 29.1 % — ABNORMAL LOW (ref 36.0–46.0)
Hemoglobin: 8.9 g/dL — ABNORMAL LOW (ref 12.0–15.0)
MCH: 28.6 pg (ref 26.0–34.0)
MCHC: 30.6 g/dL (ref 30.0–36.0)
MCV: 93.6 fL (ref 80.0–100.0)
Platelets: 235 10*3/uL (ref 150–400)
RBC: 3.11 MIL/uL — ABNORMAL LOW (ref 3.87–5.11)
RDW: 14.6 % (ref 11.5–15.5)
WBC: 16.2 10*3/uL — ABNORMAL HIGH (ref 4.0–10.5)
nRBC: 0 % (ref 0.0–0.2)

## 2021-09-06 MED ORDER — CEPHALEXIN 250 MG PO CAPS
250.0000 mg | ORAL_CAPSULE | Freq: Four times a day (QID) | ORAL | Status: DC
  Administered 2021-09-06 – 2021-09-07 (×4): 250 mg via ORAL
  Filled 2021-09-06 (×6): qty 1

## 2021-09-06 MED ORDER — POTASSIUM CHLORIDE CRYS ER 20 MEQ PO TBCR
20.0000 meq | EXTENDED_RELEASE_TABLET | Freq: Once | ORAL | Status: AC
Start: 2021-09-06 — End: 2021-09-06
  Administered 2021-09-06: 20 meq via ORAL
  Filled 2021-09-06: qty 1

## 2021-09-06 MED ORDER — FUROSEMIDE 40 MG PO TABS
40.0000 mg | ORAL_TABLET | Freq: Every day | ORAL | Status: DC
  Administered 2021-09-06 – 2021-09-11 (×5): 40 mg via ORAL
  Filled 2021-09-06 (×5): qty 1

## 2021-09-06 MED ORDER — PREDNISONE 10 MG PO TABS
10.0000 mg | ORAL_TABLET | Freq: Every day | ORAL | Status: DC
  Administered 2021-09-07 – 2021-09-11 (×5): 10 mg via ORAL
  Filled 2021-09-06 (×5): qty 1

## 2021-09-06 NOTE — Evaluation (Signed)
Occupational Therapy Evaluation Patient Details Name: Betty Jackson MRN: [id number] DOB: 1935/02/17 Today's Date: 09/06/2021   History of Present Illness 87 y/o female presented to ED  from SNF on 09/03/21 for increasing jaw pain after tooth extraction 04/2021. CT negative for abscess. Admitted for sespsis 2/2 UTI. PMH: first degree AV block, HFpEF, RA, HTN, Afib, CAD   Clinical Impression   Pt admitted for concerns listed above. PTA pt reported that she has been requiring more and more assistance recently. At this time, she is requiring min guard for standing and min A for stand pivot transfers. She is requiring mod to max A for all ADL's. Recommending pt receive further rehab upon return to SNF to maximize her independence and reduce caregiver burden. OT will follow acutely.       Recommendations for follow up therapy are one component of a multi-disciplinary discharge planning process, led by the attending physician.  Recommendations may be updated based on patient status, additional functional criteria and insurance authorization.   Follow Up Recommendations  Skilled nursing-short term rehab (<3 hours/day)    Assistance Recommended at Discharge Frequent or constant Supervision/Assistance  Patient can return home with the following A lot of help with walking and/or transfers;A lot of help with bathing/dressing/bathroom    Functional Status Assessment  Patient has had a recent decline in their functional status and demonstrates the ability to make significant improvements in function in a reasonable and predictable amount of time.  Equipment Recommendations  None recommended by OT    Recommendations for Other Services       Precautions / Restrictions Precautions Precautions: Fall Restrictions Weight Bearing Restrictions: No      Mobility Bed Mobility Overal bed mobility: Needs Assistance Bed Mobility: Supine to Sit, Sit to Supine     Supine to sit: Mod assist Sit to supine:  Min assist   General bed mobility comments: modA for trunk elevation and repositioning hips towards EOB. MinA to return to supine for LE management    Transfers Overall transfer level: Needs assistance Equipment used: Rolling walker (2 wheels) Transfers: Sit to/from Stand, Bed to chair/wheelchair/BSC Sit to Stand: Min guard, +2 safety/equipment     Step pivot transfers: Min assist     General transfer comment: increased time to complete but able to stand x 2 from low bed surface with min guard and +2 for safety. MinA to take sidesteps towards [facility] for RW management and balance      Balance Overall balance assessment: Needs assistance Sitting-balance support: No upper extremity supported, Feet supported Sitting balance-[name]: Good     Standing balance support: Bilateral upper extremity supported, Reliant on assistive device for balance Standing balance-[name]: Poor Standing balance comment: reliant on UE support                           ADL either performed or assessed with clinical judgement   ADL Overall ADL's : Needs assistance/impaired Eating/Feeding: Set up;Sitting   Grooming: Set up;Sitting   Upper Body Bathing: Minimal assistance;Sitting   Lower Body Bathing: Maximal assistance;Sitting/lateral leans;Sit to/from stand   Upper Body Dressing : Minimal assistance;Sitting   Lower Body Dressing: Maximal assistance;Sitting/lateral leans;Sit to/from stand   Toilet Transfer: Minimal assistance;Stand-pivot   Toileting- Clothing Manipulation and Hygiene: Moderate assistance;Sitting/lateral lean;Sit to/from stand       Functional mobility during ADLs: Minimal assistance;Rolling walker (2 wheels) General ADL Comments: Pt requiring incresed assist  Vision Baseline Vision/History: 0 No visual deficits Ability to See in Adequate Light: 0 Adequate Patient Visual Report: No change from baseline Vision Assessment?: No apparent visual deficits      Perception     Praxis      Pertinent Vitals/Pain Pain Assessment Pain Assessment: No/denies pain     Hand Dominance Right   Extremity/Trunk Assessment Upper Extremity Assessment Upper Extremity Assessment: Generalized weakness (severe arthritic joints)   Lower Extremity Assessment Lower Extremity Assessment: Defer to PT evaluation   Cervical / Trunk Assessment Cervical / Trunk Assessment: Kyphotic   Communication Communication Communication: HOH   Cognition Arousal/Alertness: Awake/alert Behavior During Therapy: WFL for tasks assessed/performed Overall Cognitive Status: Within Functional Limits for tasks assessed                                       General Comments  VSS on RA, motivated to work    Exercises     Shoulder Instructions      Home Living Family/patient expects to be discharged to:: Skilled nursing facility                                        Prior Functioning/Environment Prior Level of Function : Needs assist             Mobility Comments: SNF staff assists to/from w/c. Pt able to self propel using BUE/LE. ADLs Comments: Able to self feed. Staff assists with all other ADLs. Pt reports meals are brought to her room.        OT Problem List: Decreased strength;Decreased activity tolerance;Impaired balance (sitting and/or standing);Cardiopulmonary status limiting activity      OT Treatment/Interventions: Self-care/ADL training;Therapeutic exercise;Energy conservation;DME and/or AE instruction;Therapeutic activities;Patient/family education;Balance training    OT Goals(Current goals can be found in the care plan section) Acute Rehab OT Goals Patient Stated Goal: To get stronger OT Goal Formulation: With patient Time For Goal Achievement: 09/20/21 Potential to Achieve Goals: Good ADL Goals Pt Will Perform Grooming: Independently;sitting Pt Will Perform Lower Body Bathing: with min assist;with adaptive  equipment;sitting/lateral leans;sit to/from stand Pt Will Perform Lower Body Dressing: with min assist;sitting/lateral leans;sit to/from stand;with adaptive equipment Pt Will Transfer to Toilet: with supervision;stand pivot transfer Pt Will Perform Toileting - Clothing Manipulation and hygiene: with min assist;with adaptive equipment;sitting/lateral leans;sit to/from stand  OT Frequency: Min 2X/week    Co-evaluation              AM-PAC OT "6 Clicks" Daily Activity     Outcome Measure Help from another person eating meals?: A Little Help from another person taking care of personal grooming?: A Little Help from another person toileting, which includes using toliet, bedpan, or urinal?: A Lot Help from another person bathing (including washing, rinsing, drying)?: A Lot Help from another person to put on and taking off regular upper body clothing?: A Little Help from another person to put on and taking off regular lower body clothing?: A Lot 6 Click Score: 15   End of Session Equipment Utilized During Treatment: Gait belt;Rolling walker (2 wheels) Nurse Communication: Mobility status  Activity Tolerance: Patient tolerated treatment well Patient left: in bed;with call bell/phone within reach;with bed alarm set  OT Visit Diagnosis: Unsteadiness on feet (R26.81);Other abnormalities of gait and mobility (R26.89);Muscle weakness (generalized) (M62.81)  Time: 1292-9090 OT Time Calculation (min): 20 min Charges:  OT General Charges $OT Visit: 1 Visit OT Evaluation $OT Eval Moderate Complexity: 1 Mod  Mickenzie Stolar H., OTR/L Acute Rehabilitation  Ninetta Adelstein Elane Yolanda Bonine 09/06/2021, 7:14 PM

## 2021-09-06 NOTE — Evaluation (Signed)
Physical Therapy Evaluation Patient Details Name: Betty Jackson MRN: [id number] DOB: [birth year] Today's Date: 09/06/2021  History of Present Illness  87 y/o female presented to ED  from SNF on 09/03/21 for increasing jaw pain after tooth extraction 04/2021. CT negative for abscess. Admitted for sespsis 2/2 UTI. PMH: first degree AV block, HFpEF, RA, HTN, Afib, CAD  Clinical Impression  Patient admitted with the above. Patient from [facility] where she transfers to w/c with assistance from staff. Patient presents with weakness, impaired balance, and decreased activity tolerance. Patient required modA for bed mobility and min guard to stand from low bed surface. MinA required for sidesteps at [facility] for RW management and balance. Patient motivated to get stronger to be able to mobilize better. Patient will benefit from skilled PT services during acute stay to address listed deficits. Recommend PT follow up at SNF to maximize functional mobility and safety.      Recommendations for follow up therapy are one component of a multi-disciplinary discharge planning process, led by the attending physician.  Recommendations may be updated based on patient status, additional functional criteria and insurance authorization.  Follow Up Recommendations Skilled nursing-short term rehab (<3 hours/day)    Assistance Recommended at Discharge Frequent or constant Supervision/Assistance  Patient can return home with the following       Equipment Recommendations None recommended by PT  Recommendations for Other Services       Functional Status Assessment Patient has had a recent decline in their functional status and demonstrates the ability to make significant improvements in function in a reasonable and predictable amount of time.     Precautions / Restrictions Precautions Precautions: Fall Restrictions Weight Bearing Restrictions: No      Mobility  Bed Mobility Overal bed mobility: Needs Assistance Bed  Mobility: Supine to Sit, Sit to Supine     Supine to sit: Mod assist Sit to supine: Min assist   General bed mobility comments: modA for trunk elevation and repositioning hips towards EOB. MinA to return to supine for LE management    Transfers Overall transfer level: Needs assistance Equipment used: Rolling Oliviana Mcgahee (2 wheels) Transfers: Sit to/from Stand, Bed to chair/wheelchair/BSC Sit to Stand: Min guard, +2 safety/equipment   Step pivot transfers: Min assist       General transfer comment: increased time to complete but able to stand x 2 from low bed surface with min guard and +2 for safety. MinA to take sidesteps towards [facility] for RW management and balance    Ambulation/Gait                  Stairs            Wheelchair Mobility    Modified Rankin (Stroke Patients Only)       Balance Overall balance assessment: Needs assistance Sitting-balance support: No upper extremity supported, Feet supported Sitting balance-[name]: Good     Standing balance support: Bilateral upper extremity supported, Reliant on assistive device for balance Standing balance-[name]: Poor Standing balance comment: reliant on UE support                             Pertinent Vitals/Pain Pain Assessment Pain Assessment: No/denies pain    Home Living Family/patient expects to be discharged to:: Skilled nursing facility                        Prior Function Prior  Level of Function : Needs assist             Mobility Comments: SNF staff assists to/from w/c. Pt able to self propel using BUE/LE. ADLs Comments: Able to self feed. Staff assists with all other ADLs. Pt reports meals are brought to her room.     Hand Dominance        Extremity/Trunk Assessment   Upper Extremity Assessment Upper Extremity Assessment: Defer to OT evaluation    Lower Extremity Assessment Lower Extremity Assessment: Generalized weakness    Cervical / Trunk  Assessment Cervical / Trunk Assessment: Kyphotic  Communication   Communication: HOH  Cognition Arousal/Alertness: Awake/alert Behavior During Therapy: WFL for tasks assessed/performed Overall Cognitive Status: Within Functional Limits for tasks assessed                                          General Comments      Exercises     Assessment/Plan    PT Assessment Patient needs continued PT services  PT Problem List Decreased strength;Decreased activity tolerance;Decreased balance;Decreased mobility       PT Treatment Interventions DME instruction;Gait training;Functional mobility training;Therapeutic activities;Therapeutic exercise;Balance training;Patient/family education    PT Goals (Current goals can be found in the Care Plan section)  Acute Rehab PT Goals Patient Stated Goal: to get stronger PT Goal Formulation: With patient/family Time For Goal Achievement: 09/20/21 Potential to Achieve Goals: Fair    Frequency Min 2X/week     Co-evaluation               AM-PAC PT "6 Clicks" Mobility  Outcome Measure Help needed turning from your back to your side while in a flat bed without using bedrails?: A Lot Help needed moving from lying on your back to sitting on the side of a flat bed without using bedrails?: A Lot Help needed moving to and from a bed to a chair (including a wheelchair)?: A Little Help needed standing up from a chair using your arms (e.g., wheelchair or bedside chair)?: A Little Help needed to walk in hospital room?: A Lot Help needed climbing 3-5 steps with a railing? : Total 6 Click Score: 13    End of Session   Activity Tolerance: Patient tolerated treatment well Patient left: in bed;with call bell/phone within reach;with bed alarm set Nurse Communication: Mobility status PT Visit Diagnosis: Unsteadiness on feet (R26.81);Muscle weakness (generalized) (M62.81);Difficulty in walking, not elsewhere classified (R26.2)    Time:  9024-0973 PT Time Calculation (min) (ACUTE ONLY): 19 min   Charges:   PT Evaluation $PT Eval Moderate Complexity: 1 Mod          [place] Rosamilia A. Gilford Rile PT, DPT Acute Rehabilitation Services Office [phone]   Linna Hoff 09/06/2021, 4:42 PM

## 2021-09-06 NOTE — Progress Notes (Addendum)
PROGRESS NOTE    Betty Jackson  HER:[id number] DOB: 1934/04/20 DOA: 09/03/2021 PCP: Alvester Morin, MD   87 y.o. female with medical history significant of CAD, hypertension, hyperlipidemia, PAF on Eliquis, CKD stage IIIa, CAD status post DES to RCA in 2005, HFpEF, CVA, rheumatoid arthritis/giant cell arteritis on chronic prednisone, anxiety/depression, chronic insomnia, chronic low back pain, GERD, osteoporosis, chronic hypoxic respiratory failure on baseline 2 L oxygen.  Recently admitted 5/9 for dysphagia and severe esophagitis with bleeding secondary to alendronate which was discontinued, also developed A-fib RVR and aspiration pneumonia . Seen in cardiology clinic 1 day PTA, dose of Lasix was increased to 80 mg daily x2 days due to lower extremity edema. -Presented to the ED 6/7 with weakness, low-grade fevers and malaise, has ongoing intermittent jaw pain following lower teeth extraction 4 months ago. -In the ED she was febrile to 101.3, WBC of 12.2, UA grossly abnormal with cloudy urine, positive leuk esterase, many bacteria and greater than 50 WBCs.  Chest x-ray showing stable left basilar opacities favored to be atelectasis or scarring. CT soft tissue neck with contrast showing no significant inflammatory changes or abscess. -Urine culture with E. coli, improving on antibiotics   Subjective: -Feels well overall, occasional cough, no nausea or vomiting, denies any diarrhea, had a BM this morning  Assessment and Plan:  Sepsis secondary to UTI -met criteria for sepsis with fever, tachycardia, and leukocytosis -Clinically improving on ceftriaxone, urine cultures with E. Coli-pansensitive, blood cultures negative, transition to oral Keflex today to complete 5-day course -Discontinue stress dose steroids, transition to prednisone home dose of 10 Mg daily today, I suspect her leukocytosis is reactive secondary to high-dose steroids, CBC in a.m.   AKI on CKD IIIa Likely  from sepsis.  BUN 29, creatinine 1.8 (baseline 1.0) -Improving, hold losartan, Lasix resumed   Recent severe esophagitis -Secondary to bisphosphonates -Continue PPI given severe esophagitis and gastritis on recent EGD   Diarrhea -Resolved, likely secondary to recent antibiotic use, do not suspect C. difficile   Acute on chronic diastolic CHF  -Appears mildly volume overloaded secondary to fluid resuscitation for sepsis yesterday, echo done in January 2021 showing LVEF 60 to 85%, grade 2 diastolic dysfunction. -Continue Lasix, now p.o., hold losartan  Chronic anemia,  -hemoglobin back to her baseline around 9, was hemoconcentrated on admission   CAD Stable,  -Continue Eliquis and statin.   PAF Stable, currently in sinus rhythm. -Continue Eliquis, Amiodarone, and Diltiazem.   Tooth pain Patient had her lower incisors extracted 4 months ago. No gum swelling or obvious signs of oral infection at this time. CT soft tissue neck with contrast showing no significant inflammatory changes or abscess. -Outpatient follow-up with her oral surgeon recommended   Rheumatoid arthritis -Back on on prednisone 10 Mg daily at home dose, stress dose steroids discontinued    Anxiety/ depression Chronic insomnia -Continue Lexapro and Seroquel   Chronic hypoxic respiratory failure on 2L O2 No change in O2 requirement from baseline. -Continue supplemental O2   Severe esophagitis Gastritis Seen on recent EGD and felt to be medication induced, alendronate was discontinued. -Continue Protonix 40 mg BID -Recently seen by GI, plan for repeat EGD on 7/17   Multiple lung nodules -Could be secondary to rheumatoid arthritis, needs follow-up   Gout -Continue allopurinol  Chronic pain -On fentanyl 37.5 mcg and hydrocodone at baseline, resumed   DVT prophylaxis: Eliquis Code Status: DNR Family Communication: Discussed patient in detail, no family at bedside Disposition Plan: Back to  SNF when stable, likely  Monday  Consultants:    Procedures:   Antimicrobials:    Objective: Vitals:   09/05/21 1658 09/05/21 2110 09/06/21 0438 09/06/21 0840  BP: (!) 141/60 (!) 147/54 137/68 (!) 149/65  Pulse: 76 73 65 77  Resp: _0 Temp: 98.5 F (36.9 C) 98.9 F (37.2 C) 98 F (36.7 C) 98.3 F (36.8 C)  TempSrc: Oral Oral Oral Oral  SpO2: 96% 95% 95% 95%  Weight:      Height:        Intake/Output Summary (Last 24 hours) at 09/06/2021 1131 Last data filed at 09/06/2021 1000 Gross per 24 hour  Intake 1174 ml  Output 2650 ml  Net -1476 ml   Filed Weights   09/04/21 1424  Weight: 82.7 kg    Examination:  General exam: Elderly chronically ill female sitting up in bed, AAOx3, no distress, mild cognitive deficits CVS: S1-S2, regular rhythm Lungs: Few basilar rales Abdomen: Soft, nontender, bowel sounds present Extremities: Trace edema  Skin: No rashes on exposed skin Psychiatry:  Mood & affect appropriate.     Data Reviewed:   CBC: Recent Labs  Lab 09/03/21 1303 09/04/21 0341 09/05/21 0325 09/06/21 0208  WBC 12.2* 12.6* 14.3* 16.2*  NEUTROABS 11.5*  --   --   --   HGB 11.7* 10.0* 9.2* 8.9*  HCT 38.7 32.5* 29.7* 29.1*  MCV 97.5 95.6 93.4 93.6  PLT 203 203 199 845   Basic Metabolic Panel: Recent Labs  Lab 09/03/21 1303 09/04/21 0341 09/05/21 0325 09/06/21 0208  NA 138 141 139 136  K 4.4 4.0 3.7 4.7  CL 99 102 104 103  CO2 _1 GLUCOSE 188* 106* 120* 147*  BUN 29* 23 26* 29*  CREATININE 1.81* 1.54* 1.45* 1.56*  CALCIUM 8.3* 7.6* 7.4* 7.6*   GFR: Estimated Creatinine Clearance: 27.7 mL/min (A) (by C-G formula based on SCr of 1.56 mg/dL (H)). Liver Function Tests: Recent Labs  Lab 09/04/21 0341 09/05/21 0325  AST 31 22  ALT 56* 41  ALKPHOS 159* 125  BILITOT 0.9 0.4  PROT 5.3* 4.8*  ALBUMIN 2.5* 2.2*   Recent Labs  Lab 09/04/21 0341  LIPASE 30   No results for input(s): "AMMONIA" in the last 168 hours. Coagulation Profile: Recent  Labs  Lab 09/03/21 1843  INR 1.7*   Cardiac Enzymes: No results for input(s): "CKTOTAL", "CKMB", "CKMBINDEX", "TROPONINI" in the last 168 hours. BNP (last 3 results) No results for input(s): "PROBNP" in the last 8760 hours. HbA1C: No results for input(s): "HGBA1C" in the last 72 hours. CBG: No results for input(s): "GLUCAP" in the last 168 hours. Lipid Profile: No results for input(s): "CHOL", "HDL", "LDLCALC", "TRIG", "CHOLHDL", "LDLDIRECT" in the last 72 hours. Thyroid Function Tests: Recent Labs    09/04/21 0341  TSH 2.630   Anemia Panel: No results for input(s): "VITAMINB12", "FOLATE", "FERRITIN", "TIBC", "IRON", "RETICCTPCT" in the last 72 hours. Urine analysis:    Component Value Date/Time   COLORURINE AMBER (A) 09/03/2021 1821   APPEARANCEUR CLOUDY (A) 09/03/2021 1821   LABSPEC 1.034 (H) 09/03/2021 1821   PHURINE 5.0 09/03/2021 1821   GLUCOSEU NEGATIVE 09/03/2021 1821   GLUCOSEU NEGATIVE 07/31/2016 1018   HGBUR SMALL (A) 09/03/2021 1821   BILIRUBINUR NEGATIVE 09/03/2021 1821   BILIRUBINUR neg 07/31/2016 1012   KETONESUR NEGATIVE 09/03/2021 1821   PROTEINUR 30 (A) 09/03/2021 1821   UROBILINOGEN 0.2 07/31/2016 1018   UROBILINOGEN 0.2 07/31/2016 1012  NITRITE NEGATIVE 09/03/2021 1821   LEUKOCYTESUR LARGE (A) 09/03/2021 1821   Sepsis Labs: _0 (procalcitonin:4,lacticidven:4)  ) Recent Results (from the past 240 hour(s))  Blood Culture (routine x 2)     Status: None (Preliminary result)   Collection Time: 09/03/21  6:30 PM   Specimen: BLOOD  Result Value Ref Range Status   Specimen Description BLOOD SITE NOT SPECIFIED  Final   Special Requests   Final    BOTTLES DRAWN AEROBIC AND ANAEROBIC Blood Culture adequate volume   Culture   Final    NO GROWTH 3 DAYS Performed at [facility], [place] [address] [place], [place] [address]    Report Status PENDING  Incomplete  Blood Culture (routine x 2)     Status: None (Preliminary result)   Collection  Time: 09/03/21  6:43 PM   Specimen: BLOOD RIGHT HAND  Result Value Ref Range Status   Specimen Description BLOOD RIGHT HAND  Final   Special Requests   Final    BOTTLES DRAWN AEROBIC AND ANAEROBIC Blood Culture results may not be optimal due to an inadequate volume of blood received in culture bottles   Culture   Final    NO GROWTH 3 DAYS Performed at [facility], [place] [address] [place], [place] [address]    Report Status PENDING  Incomplete  Resp Panel by RT-PCR (Flu A&B, Covid) Anterior Nasal Swab     Status: None   Collection Time: 09/03/21  6:59 PM   Specimen: Anterior Nasal Swab  Result Value Ref Range Status   SARS Coronavirus 2 by RT PCR NEGATIVE NEGATIVE Final    Comment: (NOTE) SARS-CoV-2 target nucleic acids are NOT DETECTED.  The SARS-CoV-2 RNA is generally detectable in upper respiratory specimens during the acute phase of infection. The lowest concentration of SARS-CoV-2 viral copies this assay can detect is 138 copies/mL. A negative result does not preclude SARS-Cov-2 infection and should not be used as the sole basis for treatment or other patient management decisions. A negative result may occur with  improper specimen collection/handling, submission of specimen other than nasopharyngeal swab, presence of viral mutation(s) within the areas targeted by this assay, and inadequate number of viral copies(<138 copies/mL). A negative result must be combined with clinical observations, patient history, and epidemiological information. The expected result is Negative.  Fact Sheet for Patients:  EntrepreneurPulse.com.au  Fact Sheet for Healthcare Providers:  IncredibleEmployment.be  This test is no t yet approved or cleared by the [place] FDA and  has been authorized for detection and/or diagnosis of SARS-CoV-2 by FDA under an Emergency Use Authorization (EUA). This EUA will remain  in effect (meaning this test can be  used) for the duration of the COVID-19 declaration under Section 564(b)(1) of the Act, 21 U.S.C.section 360bbb-3(b)(1), unless the authorization is terminated  or revoked sooner.       Influenza A by PCR NEGATIVE NEGATIVE Final   Influenza B by PCR NEGATIVE NEGATIVE Final    Comment: (NOTE) The Xpert Xpress SARS-CoV-2/FLU/RSV plus assay is intended as an aid in the diagnosis of influenza from Nasopharyngeal swab specimens and should not be used as a sole basis for treatment. Nasal washings and aspirates are unacceptable for Xpert Xpress SARS-CoV-2/FLU/RSV testing.  Fact Sheet for Patients: EntrepreneurPulse.com.au  Fact Sheet for Healthcare Providers: IncredibleEmployment.be  This test is not yet approved or cleared by the [place] FDA and has been authorized for detection and/or diagnosis of SARS-CoV-2 by FDA under an Emergency Use  Authorization (EUA). This EUA will remain in effect (meaning this test can be used) for the duration of the COVID-19 declaration under Section 564(b)(1) of the Act, 21 U.S.C. section 360bbb-3(b)(1), unless the authorization is terminated or revoked.  Performed at [facility], [place] [address] [place], [place] [address]   Urine Culture     Status: Abnormal   Collection Time: 09/03/21  7:05 PM   Specimen: In/Out Cath Urine  Result Value Ref Range Status   Specimen Description IN/OUT CATH URINE  Final   Special Requests   Final    NONE Performed at [facility], [place] [address], [place], [place] [address]    Culture >=100,000 COLONIES/mL ESCHERICHIA COLI (A)  Final   Report Status 09/06/2021 FINAL  Final   Organism ID, Bacteria ESCHERICHIA COLI (A)  Final      Susceptibility   Escherichia coli - MIC*    AMPICILLIN >=32 RESISTANT Resistant     CEFAZOLIN 8 SENSITIVE Sensitive     CEFEPIME <=0.12 SENSITIVE Sensitive     CEFTRIAXONE <=0.25 SENSITIVE Sensitive     CIPROFLOXACIN <=0.25 SENSITIVE  Sensitive     GENTAMICIN <=1 SENSITIVE Sensitive     IMIPENEM <=0.25 SENSITIVE Sensitive     NITROFURANTOIN <=16 SENSITIVE Sensitive     TRIMETH/SULFA <=20 SENSITIVE Sensitive     AMPICILLIN/SULBACTAM 16 INTERMEDIATE Intermediate     PIP/TAZO <=4 SENSITIVE Sensitive     * >=100,000 COLONIES/mL ESCHERICHIA COLI     Radiology Studies: No results found.   Scheduled Meds:  allopurinol  100 mg Oral Daily   amiodarone  200 mg Oral Daily   apixaban  5 mg Oral BID   atorvastatin  80 mg Oral QHS   diltiazem  180 mg Oral Daily   escitalopram  10 mg Oral Daily   fentaNYL  1 patch Transdermal Q72H   fentaNYL  1 patch Transdermal Q72H   furosemide  40 mg Oral Daily   pantoprazole  40 mg Oral BID   [START ON 09/07/2021] predniSONE  10 mg Oral Q breakfast   QUEtiapine  25 mg Oral QHS   Continuous Infusions:  cefTRIAXone (ROCEPHIN)  IV 2 g (09/05/21 1700)     LOS: 3 days    Time spent: 50mn  PDomenic Polite MD [facility]   09/06/2021, 11:31 AM

## 2021-09-07 DIAGNOSIS — N390 Urinary tract infection, site not specified: Secondary | ICD-10-CM | POA: Diagnosis not present

## 2021-09-07 DIAGNOSIS — A419 Sepsis, unspecified organism: Secondary | ICD-10-CM | POA: Diagnosis not present

## 2021-09-07 LAB — CBC
HCT: 28.7 % — ABNORMAL LOW (ref 36.0–46.0)
HCT: 32.2 % — ABNORMAL LOW (ref 36.0–46.0)
Hemoglobin: 9.2 g/dL — ABNORMAL LOW (ref 12.0–15.0)
Hemoglobin: 10.1 g/dL — ABNORMAL LOW (ref 12.0–15.0)
MCH: 29.7 pg (ref 26.0–34.0)
MCH: 29.1 pg (ref 26.0–34.0)
MCHC: 32.1 g/dL (ref 30.0–36.0)
MCHC: 31.4 g/dL (ref 30.0–36.0)
MCV: 92.6 fL (ref 80.0–100.0)
MCV: 92.8 fL (ref 80.0–100.0)
Platelets: 274 10*3/uL (ref 150–400)
Platelets: 285 10*3/uL (ref 150–400)
RBC: 3.1 MIL/uL — ABNORMAL LOW (ref 3.87–5.11)
RBC: 3.47 MIL/uL — ABNORMAL LOW (ref 3.87–5.11)
RDW: 14.6 % (ref 11.5–15.5)
RDW: 14.6 % (ref 11.5–15.5)
WBC: 10.8 10*3/uL — ABNORMAL HIGH (ref 4.0–10.5)
WBC: 9.3 10*3/uL (ref 4.0–10.5)
nRBC: 0 % (ref 0.0–0.2)
nRBC: 0 % (ref 0.0–0.2)

## 2021-09-07 LAB — BASIC METABOLIC PANEL
Anion gap: 8 (ref 5–15)
BUN: 25 mg/dL — ABNORMAL HIGH (ref 8–23)
CO2: 27 mmol/L (ref 22–32)
Calcium: 8 mg/dL — ABNORMAL LOW (ref 8.9–10.3)
Chloride: 106 mmol/L (ref 98–111)
Creatinine, Ser: 1.34 mg/dL — ABNORMAL HIGH (ref 0.44–1.00)
GFR, Estimated: 39 mL/min — ABNORMAL LOW (ref >60)
Glucose, Bld: 91 mg/dL (ref 70–99)
Potassium: 4 mmol/L (ref 3.5–5.1)
Sodium: 141 mmol/L (ref 135–145)

## 2021-09-07 MED ORDER — CEPHALEXIN 250 MG PO CAPS: 250.0000 mg | ORAL_CAPSULE | Freq: Four times a day (QID) | ORAL | 0 refills | Status: DC

## 2021-09-07 MED ORDER — CEPHALEXIN 250 MG PO CAPS
250.0000 mg | ORAL_CAPSULE | Freq: Four times a day (QID) | ORAL | Status: AC
Start: 2021-09-07 — End: 2021-09-10
  Administered 2021-09-07 – 2021-09-10 (×13): 250 mg via ORAL
  Filled 2021-09-07 (×14): qty 1

## 2021-09-07 MED ORDER — FUROSEMIDE 40 MG PO TABS
40.0000 mg | ORAL_TABLET | Freq: Once | ORAL | Status: AC
  Administered 2021-09-07: 40 mg via ORAL
  Filled 2021-09-07: qty 1

## 2021-09-07 NOTE — Progress Notes (Addendum)
PROGRESS NOTE    Betty Jackson  CBJ:[id number] DOB: December 15, 1934 DOA: 09/03/2021 PCP: Alvester Morin, MD   87 y.o. female with medical history significant of CAD, hypertension, hyperlipidemia, PAF on Eliquis, CKD stage IIIa, CAD status post DES to RCA in 2005, HFpEF, CVA, rheumatoid arthritis/giant cell arteritis on chronic prednisone, anxiety/depression, chronic insomnia, chronic low back pain, GERD, osteoporosis, chronic hypoxic respiratory failure on baseline 2 L oxygen.  Recently admitted 5/9 for dysphagia and severe esophagitis with bleeding secondary to alendronate which was discontinued, also developed A-fib RVR and aspiration pneumonia . Seen in cardiology clinic 1 day PTA, dose of Lasix was increased to 80 mg daily x2 days due to lower extremity edema. -Presented to the ED 6/7 with weakness, low-grade fevers and malaise, has ongoing intermittent jaw pain following lower teeth extraction 4 months ago. -In the ED she was febrile to 101.3, WBC of 12.2, UA grossly abnormal with cloudy urine, positive leuk esterase, many bacteria and greater than 50 WBCs.  Chest x-ray showing stable left basilar opacities favored to be atelectasis or scarring. CT soft tissue neck with contrast showing no significant inflammatory changes or abscess. -Urine culture with E. coli, improving on antibiotics   Subjective: -Feels better overall, denies any new complaints, sat up yesterday  Assessment and Plan:  Sepsis secondary to UTI -met criteria for sepsis with fever, tachycardia, and leukocytosis -Clinically improving on ceftriaxone, urine cultures with E. Coli-pansensitive, blood cultures negative, transition to oral Keflex yesterday to complete 5-day course  -Off stress dose steroids, back to home regimen of prednisone  -Discharge planning, SNF tomorrow if stable   AKI on CKD IIIa -Secondary to sepsis -Improving, hold losartan, Lasix resumed   Recent severe esophagitis -Secondary to  bisphosphonates -Continue PPI given severe esophagitis and gastritis on recent EGD   Diarrhea -Resolved, likely secondary to recent antibiotic use, do not suspect C. difficile   Acute on chronic diastolic CHF  -Appears mildly volume overloaded secondary to fluid resuscitation for sepsis yesterday, echo done in January 2021 showing LVEF 60 to 16%, grade 2 diastolic dysfunction. -Continue Lasix, now p.o., hold losartan  Chronic anemia,  -hemoglobin back to her baseline around 9, was hemoconcentrated on admission   CAD Stable,  -Continue Eliquis and statin.   PAF Stable, currently in sinus rhythm. -Continue Eliquis, Amiodarone, and Diltiazem.   Tooth pain Patient had her lower incisors extracted 4 months ago. No gum swelling or obvious signs of oral infection at this time. CT soft tissue neck with contrast showing no significant inflammatory changes or abscess. -Outpatient follow-up with her oral surgeon recommended   Rheumatoid arthritis -Back on on prednisone 10 Mg daily at home dose, stress dose steroids discontinued    Anxiety/ depression Chronic insomnia -Continue Lexapro and Seroquel   Chronic hypoxic respiratory failure on 2L O2 No change in O2 requirement from baseline. -Continue supplemental O2   Severe esophagitis Gastritis Seen on recent EGD and felt to be medication induced, alendronate was discontinued. -Continue Protonix 40 mg BID -Recently seen by GI, plan for repeat EGD on 7/17   Multiple lung nodules -Could be secondary to rheumatoid arthritis, needs follow-up   Gout -Continue allopurinol  Chronic pain -On fentanyl 37.5 mcg and hydrocodone at baseline, resumed   DVT prophylaxis: Eliquis Code Status: DNR Family Communication: Discussed patient in detail, no family at bedside Disposition Plan: Back to SNF when stable, likely Monday  Consultants:    Procedures:   Antimicrobials:    Objective: Vitals:   09/06/21 1557  09/06/21 2008 09/07/21  0522 09/07/21 0949  BP: (!) 157/61 (!) 159/80 (!) 150/61 (!) 186/58  Pulse: 72 66 (!) 59 63  Resp: '16 18 18 18  ' Temp: 98 F (36.7 C) 98.7 F (37.1 C) 97.7 F (36.5 C) 98.3 F (36.8 C)  TempSrc: Oral Oral Oral Oral  SpO2: 95% 96% 95% 96%  Weight:      Height:        Intake/Output Summary (Last 24 hours) at 09/07/2021 1140 Last data filed at 09/07/2021 0867 Gross per 24 hour  Intake 360 ml  Output 1150 ml  Net -790 ml   Filed Weights   09/04/21 1424  Weight: 82.7 kg    Examination:  General exam: Elderly chronically ill female sitting up in bed, AAOx3, no distress, mild cognitive deficits CVS: S1-S2, regular rhythm Lungs: Improved air movement, rare basilar rales Abdomen: Soft, nontender, bowel sounds present Extremities: Trace edema Skin: No rashes on exposed skin Psychiatry:  Mood & affect appropriate.     Data Reviewed:   CBC: Recent Labs  Lab 09/03/21 1303 09/04/21 0341 09/05/21 0325 09/06/21 0208 09/07/21 0449  WBC 12.2* 12.6* 14.3* 16.2* 10.8*  NEUTROABS 11.5*  --   --   --   --   HGB 11.7* 10.0* 9.2* 8.9* 9.2*  HCT 38.7 32.5* 29.7* 29.1* 28.7*  MCV 97.5 95.6 93.4 93.6 92.6  PLT 203 203 199 235 619   Basic Metabolic Panel: Recent Labs  Lab 09/03/21 1303 09/04/21 0341 09/05/21 0325 09/06/21 0208 09/07/21 0449  NA 138 141 139 136 141  K 4.4 4.0 3.7 4.7 4.0  CL 99 102 104 103 106  CO2 '27 29 27 22 27  ' GLUCOSE 188* 106* 120* 147* 91  BUN 29* 23 26* 29* 25*  CREATININE 1.81* 1.54* 1.45* 1.56* 1.34*  CALCIUM 8.3* 7.6* 7.4* 7.6* 8.0*   GFR: Estimated Creatinine Clearance: 32.3 mL/min (A) (by C-G formula based on SCr of 1.34 mg/dL (H)). Liver Function Tests: Recent Labs  Lab 09/04/21 0341 09/05/21 0325  AST 31 22  ALT 56* 41  ALKPHOS 159* 125  BILITOT 0.9 0.4  PROT 5.3* 4.8*  ALBUMIN 2.5* 2.2*   Recent Labs  Lab 09/04/21 0341  LIPASE 30   No results for input(s): "AMMONIA" in the last 168 hours. Coagulation Profile: Recent Labs   Lab 09/03/21 1843  INR 1.7*   Cardiac Enzymes: No results for input(s): "CKTOTAL", "CKMB", "CKMBINDEX", "TROPONINI" in the last 168 hours. BNP (last 3 results) No results for input(s): "PROBNP" in the last 8760 hours. HbA1C: No results for input(s): "HGBA1C" in the last 72 hours. CBG: No results for input(s): "GLUCAP" in the last 168 hours. Lipid Profile: No results for input(s): "CHOL", "HDL", "LDLCALC", "TRIG", "CHOLHDL", "LDLDIRECT" in the last 72 hours. Thyroid Function Tests: No results for input(s): "TSH", "T4TOTAL", "FREET4", "T3FREE", "THYROIDAB" in the last 72 hours.  Anemia Panel: No results for input(s): "VITAMINB12", "FOLATE", "FERRITIN", "TIBC", "IRON", "RETICCTPCT" in the last 72 hours. Urine analysis:    Component Value Date/Time   COLORURINE AMBER (A) 09/03/2021 1821   APPEARANCEUR CLOUDY (A) 09/03/2021 1821   LABSPEC 1.034 (H) 09/03/2021 1821   PHURINE 5.0 09/03/2021 1821   GLUCOSEU NEGATIVE 09/03/2021 1821   GLUCOSEU NEGATIVE 07/31/2016 1018   HGBUR SMALL (A) 09/03/2021 1821   BILIRUBINUR NEGATIVE 09/03/2021 1821   BILIRUBINUR neg 07/31/2016 1012   KETONESUR NEGATIVE 09/03/2021 1821   PROTEINUR 30 (A) 09/03/2021 1821   UROBILINOGEN 0.2 07/31/2016 1018   UROBILINOGEN  0.2 07/31/2016 1012   NITRITE NEGATIVE 09/03/2021 1821   LEUKOCYTESUR LARGE (A) 09/03/2021 1821   Sepsis Labs: '@LABRCNTIP' (procalcitonin:4,lacticidven:4)  ) Recent Results (from the past 240 hour(s))  Blood Culture (routine x 2)     Status: None (Preliminary result)   Collection Time: 09/03/21  6:30 PM   Specimen: BLOOD  Result Value Ref Range Status   Specimen Description BLOOD SITE NOT SPECIFIED  Final   Special Requests   Final    BOTTLES DRAWN AEROBIC AND ANAEROBIC Blood Culture adequate volume   Culture   Final    NO GROWTH 3 DAYS Performed at [facility], [place] [address], [place], [place] [address]    Report Status PENDING  Incomplete  Blood Culture (routine x 2)      Status: None (Preliminary result)   Collection Time: 09/03/21  6:43 PM   Specimen: BLOOD RIGHT HAND  Result Value Ref Range Status   Specimen Description BLOOD RIGHT HAND  Final   Special Requests   Final    BOTTLES DRAWN AEROBIC AND ANAEROBIC Blood Culture results may not be optimal due to an inadequate volume of blood received in culture bottles   Culture   Final    NO GROWTH 3 DAYS Performed at [facility], [place] [address] [place], [place] [address]    Report Status PENDING  Incomplete  Resp Panel by RT-PCR (Flu A&B, Covid) Anterior Nasal Swab     Status: None   Collection Time: 09/03/21  6:59 PM   Specimen: Anterior Nasal Swab  Result Value Ref Range Status   SARS Coronavirus 2 by RT PCR NEGATIVE NEGATIVE Final    Comment: (NOTE) SARS-CoV-2 target nucleic acids are NOT DETECTED.  The SARS-CoV-2 RNA is generally detectable in upper respiratory specimens during the acute phase of infection. The lowest concentration of SARS-CoV-2 viral copies this assay can detect is 138 copies/mL. A negative result does not preclude SARS-Cov-2 infection and should not be used as the sole basis for treatment or other patient management decisions. A negative result may occur with  improper specimen collection/handling, submission of specimen other than nasopharyngeal swab, presence of viral mutation(s) within the areas targeted by this assay, and inadequate number of viral copies(<138 copies/mL). A negative result must be combined with clinical observations, patient history, and epidemiological information. The expected result is Negative.  Fact Sheet for Patients:  EntrepreneurPulse.com.au  Fact Sheet for Healthcare Providers:  IncredibleEmployment.be  This test is no t yet approved or cleared by the [place] FDA and  has been authorized for detection and/or diagnosis of SARS-CoV-2 by FDA under an Emergency Use Authorization (EUA). This EUA will  remain  in effect (meaning this test can be used) for the duration of the COVID-19 declaration under Section 564(b)(1) of the Act, 21 U.S.C.section 360bbb-3(b)(1), unless the authorization is terminated  or revoked sooner.       Influenza A by PCR NEGATIVE NEGATIVE Final   Influenza B by PCR NEGATIVE NEGATIVE Final    Comment: (NOTE) The Xpert Xpress SARS-CoV-2/FLU/RSV plus assay is intended as an aid in the diagnosis of influenza from Nasopharyngeal swab specimens and should not be used as a sole basis for treatment. Nasal washings and aspirates are unacceptable for Xpert Xpress SARS-CoV-2/FLU/RSV testing.  Fact Sheet for Patients: EntrepreneurPulse.com.au  Fact Sheet for Healthcare Providers: IncredibleEmployment.be  This test is not yet approved or cleared by the [place] FDA and has been authorized for detection and/or diagnosis of SARS-CoV-2 by  FDA under an Emergency Use Authorization (EUA). This EUA will remain in effect (meaning this test can be used) for the duration of the COVID-19 declaration under Section 564(b)(1) of the Act, 21 U.S.C. section 360bbb-3(b)(1), unless the authorization is terminated or revoked.  Performed at [facility], [place] [address] [place], [place] [address]   Urine Culture     Status: Abnormal   Collection Time: 09/03/21  7:05 PM   Specimen: In/Out Cath Urine  Result Value Ref Range Status   Specimen Description IN/OUT CATH URINE  Final   Special Requests   Final    NONE Performed at [facility], [place] [address] [place], [place] [address]    Culture >=100,000 COLONIES/mL ESCHERICHIA COLI (A)  Final   Report Status 09/06/2021 FINAL  Final   Organism ID, Bacteria ESCHERICHIA COLI (A)  Final      Susceptibility   Escherichia coli - MIC*    AMPICILLIN >=32 RESISTANT Resistant     CEFAZOLIN 8 SENSITIVE Sensitive     CEFEPIME <=0.12 SENSITIVE Sensitive     CEFTRIAXONE <=0.25 SENSITIVE  Sensitive     CIPROFLOXACIN <=0.25 SENSITIVE Sensitive     GENTAMICIN <=1 SENSITIVE Sensitive     IMIPENEM <=0.25 SENSITIVE Sensitive     NITROFURANTOIN <=16 SENSITIVE Sensitive     TRIMETH/SULFA <=20 SENSITIVE Sensitive     AMPICILLIN/SULBACTAM 16 INTERMEDIATE Intermediate     PIP/TAZO <=4 SENSITIVE Sensitive     * >=100,000 COLONIES/mL ESCHERICHIA COLI     Radiology Studies: No results found.   Scheduled Meds:  allopurinol  100 mg Oral Daily   amiodarone  200 mg Oral Daily   apixaban  5 mg Oral BID   atorvastatin  80 mg Oral QHS   cephALEXin  250 mg Oral Q6H   diltiazem  180 mg Oral Daily   escitalopram  10 mg Oral Daily   fentaNYL  1 patch Transdermal Q72H   fentaNYL  1 patch Transdermal Q72H   furosemide  40 mg Oral Daily   pantoprazole  40 mg Oral BID   predniSONE  10 mg Oral Q breakfast   QUEtiapine  25 mg Oral QHS   Continuous Infusions:     LOS: 4 days    Time spent: 13mn  PDomenic Polite MD [facility]   09/07/2021, 11:40 AM

## 2021-09-07 NOTE — Discharge Summary (Incomplete)
Physician Discharge Summary  Betty Jackson XBM:[id number] DOB: 1935/02/10 DOA: 09/03/2021  PCP: Alvester Morin, MD  Admit date: 09/03/2021 Discharge date: 09/08/2021  Time spent: 35 minutes  Recommendations for Outpatient Follow-up:  PCP in 1 week, please check CBC and BMP at follow-up Follow-up with oral surgeon in 1 to 2 weeks Gastroenterology Dr. Loletha Carrow for repeat endoscopy 7/7 Follow-up of pulmonary nodules Significant chronic disease burden, may need palliative care follow-up in the near future   Discharge Diagnoses:  Principal Problem:   Sepsis secondary to UTI ([place]) Chronic hypoxic respiratory failure on 2 L home O2 Severe esophagitis Gastritis Chronic pain syndrome Chronic narcotic dependence Anemia of chronic disease and iron deficiency   History of atrial fibrillation   CAD (coronary artery disease)   PAF (paroxysmal atrial fibrillation) (HCC)   Nausea and vomiting   Diarrhea   CHF (congestive heart failure) (HCC)   Acute kidney injury superimposed on chronic kidney disease (HCC)   Lung nodules DO NOT RESUSCITATE  Discharge Condition: Stable  Diet recommendation: Low-sodium, heart healthy  Filed Weights   09/04/21 1424  Weight: 82.7 kg    History of present illness:  87 y.o. female with medical history significant of CAD, hypertension, hyperlipidemia, PAF on Eliquis, CKD stage IIIa, CAD status post DES to RCA in 2005, HFpEF, CVA, rheumatoid arthritis/giant cell arteritis on chronic prednisone, anxiety/depression, chronic insomnia, chronic low back pain, GERD, osteoporosis, chronic hypoxic respiratory failure on baseline 2 L oxygen.  Recently admitted 5/9 for dysphagia and severe esophagitis with bleeding secondary to alendronate which was discontinued, also developed A-fib RVR and aspiration pneumonia . Seen in cardiology clinic 1 day PTA, dose of Lasix was increased to 80 mg daily x2 days due to lower extremity edema. -Presented to the ED 6/7 with weakness,  low-grade fevers and malaise, has ongoing intermittent jaw pain following lower teeth extraction 4 months ago. -In the ED she was febrile to 101.3, WBC of 12.2, UA grossly abnormal with cloudy urine, positive leuk esterase, many bacteria and greater than 50 WBCs.  Chest x-ray showing stable left basilar opacities favored to be atelectasis or scarring. CT soft tissue neck with contrast showing no significant inflammatory changes or abscess.  Hospital Course:   Sepsis secondary to UTI -met criteria for sepsis with fever, tachycardia, and leukocytosis -Clinically improving on ceftriaxone, urine cultures with E. Coli-pansensitive, blood cultures negative, transitioned to oral Keflex yesterday to complete 5-day course  -Off stress dose steroids, back to home regimen of prednisone  -Discharge planning, SNF for short-term rehab, follow-up with PCP in 1 week   AKI on CKD IIIa -Secondary to sepsis -Improving, losartan discontinued, Lasix resumed yesterday, creatinine back to baseline   Recent severe esophagitis -Secondary to bisphosphonates -Continue PPI given severe esophagitis and gastritis on recent EGD   Diarrhea -Resolved, likely secondary to recent antibiotic use, do not suspect C. difficile   Acute on chronic diastolic CHF  -Was volume overloaded following fluid resuscitation for sepsis yesterday, echo done in January 2021 showing LVEF 60 to 02%, grade 2 diastolic dysfunction. -Diuresed with IV Lasix yesterday, transition to oral diuretics -Continue Lasix, losartan held with AKI, can be resumed at follow-up if creatinine stays stable   Chronic anemia,  -hemoglobin back to her baseline around 9, was hemoconcentrated on admission   CAD Stable,  -Continue Eliquis and statin.   PAF Stable, currently in sinus rhythm. -Continue Eliquis, Amiodarone, and Diltiazem.   Tooth pain Patient had her lower incisors extracted 4 months ago. No gum  swelling or obvious signs of oral infection at  this time. CT soft tissue neck with contrast showing no significant inflammatory changes or abscess. -Outpatient follow-up with her oral surgeon recommended   Rheumatoid arthritis -Back on on prednisone 10 Mg daily at home dose, stress dose steroids discontinued    Anxiety/ depression Chronic insomnia -Continue Lexapro and Seroquel   Chronic hypoxic respiratory failure on 2L O2 No change in O2 requirement from baseline. -Continue supplemental O2   Severe esophagitis Gastritis Seen on recent EGD and felt to be medication induced, alendronate was discontinued. -Continue Protonix 40 mg BID -Recently seen by GI, plan for repeat EGD on 7/17   Multiple lung nodules -Could be secondary to rheumatoid arthritis, needs follow-up   Gout -Continue allopurinol   Chronic pain -On fentanyl 37.5 mcg and hydrocodone at baseline, resumed  Code Status: DNR  Discharge Exam: Vitals:   09/07/21 0522 09/07/21 0949  BP: (!) 150/61 (!) 186/58  Pulse: (!) 59 63  Resp: 18 18  Temp: 97.7 F (36.5 C) 98.3 F (36.8 C)  SpO2: 95% 96%   General exam: Elderly chronically ill female sitting up in bed, AAOx3, no distress, mild cognitive deficits CVS: S1-S2, regular rhythm Lungs: Improved air movement, rare basilar rales Abdomen: Soft, nontender, bowel sounds present Extremities: Trace edema Skin: No rashes on exposed skin Psychiatry:  Mood & affect appropriate.   Discharge Instructions    Allergies as of 09/07/2021       Reactions   Alendronate Sodium    Suspected cause of severe esophagitis (per EGD 08/05/21)   Dextromethorphan Rash        Medication List     STOP taking these medications    amoxicillin 500 MG capsule Commonly known as: AMOXIL   amoxicillin 500 MG tablet Commonly known as: AMOXIL   losartan 50 MG tablet Commonly known as: COZAAR   sucralfate 1 GM/10ML suspension Commonly known as: CARAFATE       TAKE these medications    acetaminophen 500 MG  tablet Commonly known as: TYLENOL Take 500 mg by mouth every 6 (six) hours as needed for mild pain.   allopurinol 100 MG tablet Commonly known as: ZYLOPRIM Take 100 mg by mouth daily.   amiodarone 200 MG tablet Commonly known as: PACERONE Take 210m twice daily for 5 days, then take 2054monce daily. What changed:  how much to take how to take this when to take this additional instructions   atorvastatin 80 MG tablet Commonly known as: Lipitor Take 1 tablet (80 mg total) by mouth daily. What changed: when to take this   cephALEXin 250 MG capsule Commonly known as: KEFLEX Take 1 capsule (250 mg total) by mouth every 6 (six) hours for 4 days.   cetirizine 10 MG tablet Commonly known as: ZYRTEC Take 10 mg by mouth at bedtime.   diltiazem 180 MG 24 hr capsule Commonly known as: CARDIZEM CD Take 180 mg by mouth daily.   Eliquis 5 MG Tabs tablet Generic drug: apixaban TAKE 1 TABLET BY MOUTH TWICE A DAY What changed: how much to take   escitalopram 10 MG tablet Commonly known as: LEXAPRO TAKE 1 TABLET BY MOUTH EVERY DAY   fentaNYL 37.5 MCG/HR Pt72 Place 1 patch onto the skin every 3 (three) days.   ferrous sulfate 325 (65 FE) MG tablet Take 1 tablet (325 mg total) by mouth 2 (two) times daily with a meal.   furosemide 40 MG tablet Commonly known as: LASIX Take 40  mg by mouth daily. What changed: Another medication with the same name was removed. Continue taking this medication, and follow the directions you see here.   furosemide 40 MG tablet Commonly known as: LASIX Take 40 mg by mouth daily as needed (weight gain of 2 pounds overnight or 5 pound in 1 week). What changed: Another medication with the same name was removed. Continue taking this medication, and follow the directions you see here.   HYDROcodone-acetaminophen 5-325 MG tablet Commonly known as: Norco Take 1 tablet by mouth every 12 (twelve) hours as needed for moderate pain.   ipratropium-albuterol  0.5-2.5 (3) MG/3ML Soln Commonly known as: DUONEB Take 3 mLs by nebulization every 4 (four) hours as needed. What changed: reasons to take this   multivitamin with minerals Tabs tablet Take 1 tablet by mouth daily.   Nutritional Supplement Liqd Take 120 mLs by mouth daily. House stock supplement   ondansetron 4 MG tablet Commonly known as: ZOFRAN Take 4 mg by mouth every 8 (eight) hours as needed for nausea or vomiting.   pantoprazole 40 MG tablet Commonly known as: PROTONIX Take 1 tablet (40 mg total) by mouth 2 (two) times daily.   potassium chloride 10 MEQ tablet Commonly known as: KLOR-CON M Take 1 tablet (10 mEq total) by mouth daily.   predniSONE 10 MG tablet Commonly known as: DELTASONE Take 1 tablet (10 mg total) by mouth daily with breakfast. Resume after 3 days of 15m daily as instructed.   QUEtiapine 25 MG tablet Commonly known as: SEROQUEL Take 1 tablet (25 mg total) by mouth at bedtime.   sulfaSALAzine 500 MG EC tablet Commonly known as: AZULFIDINE Take 500 mg by mouth daily.   Vitamin D3 25 MCG tablet Commonly known as: Vitamin D Take 2 tablets (2,000 Units total) by mouth 2 (two) times daily.       Allergies  Allergen Reactions   Alendronate Sodium     Suspected cause of severe esophagitis (per EGD 08/05/21)   Dextromethorphan Rash      The results of significant diagnostics from this hospitalization (including imaging, microbiology, ancillary and laboratory) are listed below for reference.    Significant Diagnostic Studies: CT CHEST WO CONTRAST  Result Date: 09/04/2021 CLINICAL DATA:  Questionable sepsis. EXAM: CT CHEST WITHOUT CONTRAST TECHNIQUE: Multidetector CT imaging of the chest was performed following the standard protocol without IV contrast. RADIATION DOSE REDUCTION: This exam was performed according to the departmental dose-optimization program which includes automated exposure control, adjustment of the mA and/or kV according to patient  size and/or use of iterative reconstruction technique. COMPARISON:  Chest radiograph dated 09/03/2021. Chest CT dated 04/15/2019. FINDINGS: Evaluation of this exam is limited in the absence of intravenous contrast. Cardiovascular: There is no cardiomegaly or pericardial effusion. Three-vessel coronary vascular calcification. Mild atherosclerotic calcification of the thoracic aorta. No aneurysmal dilatation. The central pulmonary arteries are grossly unremarkable. Mediastinum/Nodes: Left hilar and mediastinal calcified lymph node or granuloma. Small hiatal hernia. The esophagus is grossly unremarkable. No mediastinal fluid collection. Lungs/Pleura: Left lung base patchy atelectasis. Pneumonia is not excluded. No large pleural effusion. No pneumothorax. The central airways are patent. A 4 mm right lower lobe nodule (74/6) present on the prior CT. A 5 mm nodule in the right middle lobe (79/6). Additional 4 mm right lower lobe nodule (80/6). Upper Abdomen: Cholecystectomy.  Scattered colonic diverticula. Musculoskeletal: Osteopenia with degenerative changes of the spine. Age indeterminate T7 compression fracture with approximately 50% loss of vertebral body height, new since  the prior CT. Correlation with clinical exam and point tenderness recommended. T11 and L1 vertebroplasty changes. IMPRESSION: 1. Left lung base patchy atelectasis. Pneumonia is not excluded. 2. Several pulmonary nodules measure up to 5 mm. No follow-up needed if patient is low-risk (and has no known or suspected primary neoplasm). Non-contrast chest CT can be considered in 12 months if patient is high-risk. This recommendation follows the consensus statement: Guidelines for Management of Incidental Pulmonary Nodules Detected on CT Images: From the Fleischner Society 2017; Radiology 2017; 284:228-243. 3. Age indeterminate T7 compression fracture, new since the prior CT. Correlation with clinical exam and point tenderness recommended. 4. Aortic  Atherosclerosis (ICD10-I70.0). Electronically Signed   By: Anner Crete M.D.   On: 09/04/2021 03:40   DG Chest Port 1 View  Result Date: 09/03/2021 CLINICAL DATA:  Questionable sepsis. EXAM: PORTABLE CHEST 1 VIEW COMPARISON:  Chest x-ray 08/06/2021. CT abdomen and pelvis 07/17/2020. FINDINGS: There is some stable linear and patchy airspace opacity in the left lower lung. One area is slightly nodular. Lungs are otherwise clear. No pleural effusion or pneumothorax. Cardiomediastinal silhouette is within normal limits. IMPRESSION: 1. Stable left basilar opacities favored is atelectasis or scarring. One area is slightly nodular. A follow-up chest CT should be considered. 2. No new focal lung infiltrate. Electronically Signed   By: Ronney Asters M.D.   On: 09/03/2021 19:04   CT Soft Tissue Neck W Contrast  Result Date: 09/03/2021 CLINICAL DATA:  Soft tissue swelling, infection suspected; Post-operative lower gumline pain near incisor sites, swelling - evaluate for underlying tracking abscess (lower mouth) EXAM: CT NECK WITH CONTRAST TECHNIQUE: Multidetector CT imaging of the neck was performed using the standard protocol following the bolus administration of intravenous contrast. RADIATION DOSE REDUCTION: This exam was performed according to the departmental dose-optimization program which includes automated exposure control, adjustment of the mA and/or kV according to patient size and/or use of iterative reconstruction technique. CONTRAST:  29m OMNIPAQUE IOHEXOL 300 MG/ML  SOLN COMPARISON:  None Available. FINDINGS: Motion artifact is present at some levels. Pharynx and larynx: Unremarkable. No mass or swelling within limitation of streak artifact from dental amalgam. Salivary glands: Parotid and submandibular glands are unremarkable. Thyroid: Unremarkable. Lymph nodes: No enlarged or abnormal density nodes. Vascular: Major neck vessels are patent. Calcified plaque at the common carotid bifurcations. Limited  intracranial: No abnormal enhancement. Visualized orbits: Bilateral lens replacements. Mastoids and visualized paranasal sinuses: Mild mucosal thickening. Mastoid air cells are clear. Skeleton: Advanced degenerative changes of the cervical spine. Upper chest: No apical lung mass. Other: None. IMPRESSION: No significant inflammatory changes identified.  No abscess. Electronically Signed   By: PMacy MisM.D.   On: 09/03/2021 17:57    Microbiology: Recent Results (from the past 240 hour(s))  Blood Culture (routine x 2)     Status: None (Preliminary result)   Collection Time: 09/03/21  6:30 PM   Specimen: BLOOD  Result Value Ref Range Status   Specimen Description BLOOD SITE NOT SPECIFIED  Final   Special Requests   Final    BOTTLES DRAWN AEROBIC AND ANAEROBIC Blood Culture adequate volume   Culture   Final    NO GROWTH 3 DAYS Performed at [facility] [address] [place] [address]   Report Status PENDING  Incomplete  Blood Culture (routine x 2)     Status: None (Preliminary result)   Collection Time: 09/03/21  6:43 PM   Specimen: BLOOD RIGHT HAND  Result Value Ref  Range Status   Specimen Description BLOOD RIGHT HAND  Final   Special Requests   Final    BOTTLES DRAWN AEROBIC AND ANAEROBIC Blood Culture results may not be optimal due to an inadequate volume of blood received in culture bottles   Culture   Final    NO GROWTH 3 DAYS Performed at [facility], [place] [address], [place], [place] [address]    Report Status PENDING  Incomplete  Resp Panel by RT-PCR (Flu A&B, Covid) Anterior Nasal Swab     Status: None   Collection Time: 09/03/21  6:59 PM   Specimen: Anterior Nasal Swab  Result Value Ref Range Status   SARS Coronavirus 2 by RT PCR NEGATIVE NEGATIVE Final    Comment: (NOTE) SARS-CoV-2 target nucleic acids are NOT DETECTED.  The SARS-CoV-2 RNA is generally detectable in upper respiratory specimens during the acute phase of infection. The  lowest concentration of SARS-CoV-2 viral copies this assay can detect is 138 copies/mL. A negative result does not preclude SARS-Cov-2 infection and should not be used as the sole basis for treatment or other patient management decisions. A negative result may occur with  improper specimen collection/handling, submission of specimen other than nasopharyngeal swab, presence of viral mutation(s) within the areas targeted by this assay, and inadequate number of viral copies(<138 copies/mL). A negative result must be combined with clinical observations, patient history, and epidemiological information. The expected result is Negative.  Fact Sheet for Patients:  EntrepreneurPulse.com.au  Fact Sheet for Healthcare Providers:  IncredibleEmployment.be  This test is no t yet approved or cleared by the [place] FDA and  has been authorized for detection and/or diagnosis of SARS-CoV-2 by FDA under an Emergency Use Authorization (EUA). This EUA will remain  in effect (meaning this test can be used) for the duration of the COVID-19 declaration under Section 564(b)(1) of the Act, 21 U.S.C.section 360bbb-3(b)(1), unless the authorization is terminated  or revoked sooner.       Influenza A by PCR NEGATIVE NEGATIVE Final   Influenza B by PCR NEGATIVE NEGATIVE Final    Comment: (NOTE) The Xpert Xpress SARS-CoV-2/FLU/RSV plus assay is intended as an aid in the diagnosis of influenza from Nasopharyngeal swab specimens and should not be used as a sole basis for treatment. Nasal washings and aspirates are unacceptable for Xpert Xpress SARS-CoV-2/FLU/RSV testing.  Fact Sheet for Patients: EntrepreneurPulse.com.au  Fact Sheet for Healthcare Providers: IncredibleEmployment.be  This test is not yet approved or cleared by the [place] FDA and has been authorized for detection and/or diagnosis of SARS-CoV-2 by FDA under  an Emergency Use Authorization (EUA). This EUA will remain in effect (meaning this test can be used) for the duration of the COVID-19 declaration under Section 564(b)(1) of the Act, 21 U.S.C. section 360bbb-3(b)(1), unless the authorization is terminated or revoked.  Performed at [facility], [place] [address] [place] [address] [place], [place] [address]   Urine Culture     Status: Abnormal   Collection Time: 09/03/21  7:05 PM   Specimen: In/Out Cath Urine  Result Value Ref Range Status   Specimen Description IN/OUT CATH URINE  Final   Special Requests   Final    NONE Performed at [facility], [place] [address] [place], [place] [address]    Culture >=100,000 COLONIES/mL ESCHERICHIA COLI (A)  Final   Report Status 09/06/2021 FINAL  Final   Organism ID, Bacteria ESCHERICHIA COLI (A)  Final      Susceptibility  Escherichia coli - MIC*    AMPICILLIN >=32 RESISTANT Resistant     CEFAZOLIN 8 SENSITIVE Sensitive     CEFEPIME <=0.12 SENSITIVE Sensitive     CEFTRIAXONE <=0.25 SENSITIVE Sensitive     CIPROFLOXACIN <=0.25 SENSITIVE Sensitive     GENTAMICIN <=1 SENSITIVE Sensitive     IMIPENEM <=0.25 SENSITIVE Sensitive     NITROFURANTOIN <=16 SENSITIVE Sensitive     TRIMETH/SULFA <=20 SENSITIVE Sensitive     AMPICILLIN/SULBACTAM 16 INTERMEDIATE Intermediate     PIP/TAZO <=4 SENSITIVE Sensitive     * >=100,000 COLONIES/mL ESCHERICHIA COLI     Labs: Basic Metabolic Panel: Recent Labs  Lab 09/03/21 1303 09/04/21 0341 09/05/21 0325 09/06/21 0208 09/07/21 0449  NA 138 141 139 136 141  K 4.4 4.0 3.7 4.7 4.0  CL 99 102 104 103 106  CO2 _0 GLUCOSE 188* 106* 120* 147* 91  BUN 29* 23 26* 29* 25*  CREATININE 1.81* 1.54* 1.45* 1.56* 1.34*  CALCIUM 8.3* 7.6* 7.4* 7.6* 8.0*   Liver Function Tests: Recent Labs  Lab 09/04/21 0341 09/05/21 0325  AST 31 22  ALT 56* 41  ALKPHOS 159* 125  BILITOT 0.9 0.4  PROT 5.3* 4.8*  ALBUMIN 2.5* 2.2*   Recent Labs  Lab 09/04/21 0341   LIPASE 30   No results for input(s): "AMMONIA" in the last 168 hours. CBC: Recent Labs  Lab 09/03/21 1303 09/04/21 0341 09/05/21 0325 09/06/21 0208 09/07/21 0449  WBC 12.2* 12.6* 14.3* 16.2* 10.8*  NEUTROABS 11.5*  --   --   --   --   HGB 11.7* 10.0* 9.2* 8.9* 9.2*  HCT 38.7 32.5* 29.7* 29.1* 28.7*  MCV 97.5 95.6 93.4 93.6 92.6  PLT 203 203 199 235 274   Cardiac Enzymes: No results for input(s): "CKTOTAL", "CKMB", "CKMBINDEX", "TROPONINI" in the last 168 hours. BNP: BNP (last 3 results) Recent Labs    09/04/21 0341  BNP 109.2*    ProBNP (last 3 results) No results for input(s): "PROBNP" in the last 8760 hours.  CBG: No results for input(s): "GLUCAP" in the last 168 hours.     Signed:  Domenic Polite MD.  [facility] 09/07/2021, 1:22 PM

## 2021-09-08 DIAGNOSIS — N390 Urinary tract infection, site not specified: Secondary | ICD-10-CM | POA: Diagnosis not present

## 2021-09-08 DIAGNOSIS — R1310 Dysphagia, unspecified: Secondary | ICD-10-CM

## 2021-09-08 DIAGNOSIS — K921 Melena: Secondary | ICD-10-CM

## 2021-09-08 DIAGNOSIS — K209 Esophagitis, unspecified without bleeding: Secondary | ICD-10-CM

## 2021-09-08 DIAGNOSIS — A419 Sepsis, unspecified organism: Secondary | ICD-10-CM | POA: Diagnosis not present

## 2021-09-08 LAB — BASIC METABOLIC PANEL
Anion gap: 9 (ref 5–15)
BUN: 21 mg/dL (ref 8–23)
CO2: 30 mmol/L (ref 22–32)
Calcium: 8.2 mg/dL — ABNORMAL LOW (ref 8.9–10.3)
Chloride: 102 mmol/L (ref 98–111)
Creatinine, Ser: 1.42 mg/dL — ABNORMAL HIGH (ref 0.44–1.00)
GFR, Estimated: 36 mL/min — ABNORMAL LOW (ref >60)
Glucose, Bld: 103 mg/dL — ABNORMAL HIGH (ref 70–99)
Potassium: 3.6 mmol/L (ref 3.5–5.1)
Sodium: 141 mmol/L (ref 135–145)

## 2021-09-08 LAB — CULTURE, BLOOD (ROUTINE X 2)
Culture: NO GROWTH
Culture: NO GROWTH
Special Requests: ADEQUATE

## 2021-09-08 LAB — CBC
HCT: 31 % — ABNORMAL LOW (ref 36.0–46.0)
Hemoglobin: 9.7 g/dL — ABNORMAL LOW (ref 12.0–15.0)
MCH: 29.1 pg (ref 26.0–34.0)
MCHC: 31.3 g/dL (ref 30.0–36.0)
MCV: 93.1 fL (ref 80.0–100.0)
Platelets: 277 10*3/uL (ref 150–400)
RBC: 3.33 MIL/uL — ABNORMAL LOW (ref 3.87–5.11)
RDW: 14.6 % (ref 11.5–15.5)
WBC: 10.2 10*3/uL (ref 4.0–10.5)
nRBC: 0.6 % — ABNORMAL HIGH (ref 0.0–0.2)

## 2021-09-08 MED ORDER — BISACODYL 5 MG PO TBEC
20.0000 mg | DELAYED_RELEASE_TABLET | Freq: Once | ORAL | Status: AC
  Administered 2021-09-08: 20 mg via ORAL
  Filled 2021-09-08: qty 4

## 2021-09-08 MED ORDER — BISACODYL 5 MG PO TBEC
20.0000 mg | DELAYED_RELEASE_TABLET | Freq: Once | ORAL | Status: AC
  Administered 2021-09-09: 20 mg via ORAL
  Filled 2021-09-08: qty 4

## 2021-09-08 MED ORDER — PEG-KCL-NACL-NASULF-NA ASC-C 100 G PO SOLR
0.5000 | Freq: Once | ORAL | Status: AC
  Administered 2021-09-09: 100 g via ORAL

## 2021-09-08 MED ORDER — METOCLOPRAMIDE HCL 5 MG/ML IJ SOLN
10.0000 mg | Freq: Once | INTRAMUSCULAR | Status: AC
  Administered 2021-09-09: 10 mg via INTRAVENOUS
  Filled 2021-09-08: qty 2

## 2021-09-08 MED ORDER — PEG-KCL-NACL-NASULF-NA ASC-C 100 G PO SOLR
0.5000 | Freq: Once | ORAL | Status: AC
Start: 2021-09-09 — End: 2021-09-09
  Administered 2021-09-09: 100 g via ORAL
  Filled 2021-09-08: qty 1

## 2021-09-08 NOTE — Progress Notes (Signed)
PROGRESS NOTE    Betty Jackson  VOJ:[id number] DOB: 1934/05/06 DOA: 09/03/2021 PCP: Alvester Morin, MD   87 y.o. female with medical history significant of CAD, hypertension, hyperlipidemia, PAF on Eliquis, CKD stage IIIa, CAD status post DES to RCA in 2005, HFpEF, CVA, rheumatoid arthritis/giant cell arteritis on chronic prednisone, anxiety/depression, chronic insomnia, chronic low back pain, GERD, osteoporosis, chronic hypoxic respiratory failure on baseline 2 L oxygen.  Recently admitted 5/9 for dysphagia and severe esophagitis with bleeding secondary to alendronate which was discontinued, also developed A-fib RVR and aspiration pneumonia . Seen in cardiology clinic 1 day PTA, dose of Lasix was increased to 80 mg daily x2 days due to lower extremity edema. -Presented to the ED 6/7 with weakness, low-grade fevers and malaise -Admitted with sepsis, E. coli UTI, improving on antibiotics -6/11 evening with hematochezia, blood clots in stool  Subjective: -Feels okay overall, denies any complaints, RN yesterday evening reported large bloody stool with multiple clots  Assessment and Plan:  Sepsis secondary to UTI -met criteria for sepsis with fever, tachycardia, and leukocytosis -Clinically improving on ceftriaxone, urine cultures with E. Coli-pansensitive, blood cultures negative, transitioned to oral Keflex yesterday to complete 5-day course  -Off stress dose steroids, back to home regimen of prednisone  -Discharge planning, SNF tomorrow if stable  Hematochezia with clots -Episode yesterday evening and another episode with smaller amounts 2 days ago -Eliquis held since yesterday evening, hemoglobin is stable, recent esophagitis, clinically do not suspect upper GI bleed, BUN is stable as well -Followed by [facility], will request gastroenterology input, colonoscopy 2013 noted mild diverticulosis and hemorrhoids   AKI on CKD IIIa -Secondary to sepsis -Improving, hold losartan, Lasix  resumed   Recent severe esophagitis -Secondary to bisphosphonates -Continue PPI given severe esophagitis and gastritis on recent EGD, plan for repeat EGD 7/17   Acute on chronic diastolic CHF  -Appears mildly volume overloaded secondary to fluid resuscitation for sepsis yesterday, echo done in January 2021 showing LVEF 60 to 99%, grade 2 diastolic dysfunction. -Continue Lasix, now p.o., hold losartan  Chronic anemia,  -Hemoglobin stable at baseline, will check anemia panel   CAD Stable, continue statin, holding Eliquis   PAF Stable, currently in sinus rhythm. -Continue Amiodarone, and Diltiazem.  Holding Eliquis today   Tooth pain Patient had her lower incisors extracted 4 months ago. No gum swelling or obvious signs of oral infection at this time. CT soft tissue neck with contrast showing no significant inflammatory changes or abscess. -Outpatient follow-up with her oral surgeon recommended   Rheumatoid arthritis -Back on on prednisone 10 Mg daily at home dose, stress dose steroids discontinued    Anxiety/ depression Chronic insomnia -Continue Lexapro and Seroquel   Chronic hypoxic respiratory failure on 2L O2 No change in O2 requirement from baseline. -Continue supplemental O2   Multiple lung nodules -Could be secondary to rheumatoid arthritis, needs follow-up   Gout -Continue allopurinol  Chronic pain -On fentanyl 37.5 mcg and hydrocodone at baseline, resumed   DVT prophylaxis: Eliquis Code Status: DNR Family Communication: Discussed patient in detail, daughter at bedside Disposition Plan: Back to SNF when stable  Consultants: Gastroenterology   Procedures:   Antimicrobials:    Objective: Vitals:   09/07/21 1739 09/07/21 2053 09/08/21 0532 09/08/21 0938  BP: (!) 182/64 (!) 176/72 (!) 176/71 (!) 185/69  Pulse: 72 62 (!) 55 69  Resp: '20 18 18 17  ' Temp: 98.4 F (36.9 C) 98.4 F (36.9 C) 98.3 F (36.8 C) 98.6 F (37  C)  TempSrc: Oral Oral Oral Oral   SpO2: 94% 96% 94% 93%  Weight:      Height:        Intake/Output Summary (Last 24 hours) at 09/08/2021 1017 Last data filed at 09/08/2021 0600 Gross per 24 hour  Intake 937 ml  Output 2900 ml  Net -1963 ml   Filed Weights   09/04/21 1424  Weight: 82.7 kg    Examination:  General exam: Pleasant chronically ill elderly female sitting up in bed, AAOx3, no distress CVS: S1-S2, regular rhythm Lungs: Rare basilar rales otherwise clear Abdomen: Soft, nontender, bowel sounds present Extremities: Trace edema Skin: No rashes on exposed skin Psychiatry:  Mood & affect appropriate.     Data Reviewed:   CBC: Recent Labs  Lab 09/03/21 1303 09/04/21 0341 09/05/21 0325 09/06/21 0208 09/07/21 0449 09/07/21 1548 09/08/21 0243  WBC 12.2*   < > 14.3* 16.2* 10.8* 9.3 10.2  NEUTROABS 11.5*  --   --   --   --   --   --   HGB 11.7*   < > 9.2* 8.9* 9.2* 10.1* 9.7*  HCT 38.7   < > 29.7* 29.1* 28.7* 32.2* 31.0*  MCV 97.5   < > 93.4 93.6 92.6 92.8 93.1  PLT 203   < > 199 235 274 285 277   < > = values in this interval not displayed.   Basic Metabolic Panel: Recent Labs  Lab 09/04/21 0341 09/05/21 0325 09/06/21 0208 09/07/21 0449 09/08/21 0243  NA 141 139 136 141 141  K 4.0 3.7 4.7 4.0 3.6  CL 102 104 103 106 102  CO2 '29 27 22 27 30  ' GLUCOSE 106* 120* 147* 91 103*  BUN 23 26* 29* 25* 21  CREATININE 1.54* 1.45* 1.56* 1.34* 1.42*  CALCIUM 7.6* 7.4* 7.6* 8.0* 8.2*   GFR: Estimated Creatinine Clearance: 30.5 mL/min (A) (by C-G formula based on SCr of 1.42 mg/dL (H)). Liver Function Tests: Recent Labs  Lab 09/04/21 0341 09/05/21 0325  AST 31 22  ALT 56* 41  ALKPHOS 159* 125  BILITOT 0.9 0.4  PROT 5.3* 4.8*  ALBUMIN 2.5* 2.2*   Recent Labs  Lab 09/04/21 0341  LIPASE 30   No results for input(s): "AMMONIA" in the last 168 hours. Coagulation Profile: Recent Labs  Lab 09/03/21 1843  INR 1.7*   Cardiac Enzymes: No results for input(s): "CKTOTAL", "CKMB",  "CKMBINDEX", "TROPONINI" in the last 168 hours. BNP (last 3 results) No results for input(s): "PROBNP" in the last 8760 hours. HbA1C: No results for input(s): "HGBA1C" in the last 72 hours. CBG: No results for input(s): "GLUCAP" in the last 168 hours. Lipid Profile: No results for input(s): "CHOL", "HDL", "LDLCALC", "TRIG", "CHOLHDL", "LDLDIRECT" in the last 72 hours. Thyroid Function Tests: No results for input(s): "TSH", "T4TOTAL", "FREET4", "T3FREE", "THYROIDAB" in the last 72 hours.  Anemia Panel: No results for input(s): "VITAMINB12", "FOLATE", "FERRITIN", "TIBC", "IRON", "RETICCTPCT" in the last 72 hours. Urine analysis:    Component Value Date/Time   COLORURINE AMBER (A) 09/03/2021 1821   APPEARANCEUR CLOUDY (A) 09/03/2021 1821   LABSPEC 1.034 (H) 09/03/2021 1821   PHURINE 5.0 09/03/2021 1821   GLUCOSEU NEGATIVE 09/03/2021 1821   GLUCOSEU NEGATIVE 07/31/2016 1018   HGBUR SMALL (A) 09/03/2021 1821   BILIRUBINUR NEGATIVE 09/03/2021 1821   BILIRUBINUR neg 07/31/2016 1012   KETONESUR NEGATIVE 09/03/2021 1821   PROTEINUR 30 (A) 09/03/2021 1821   UROBILINOGEN 0.2 07/31/2016 1018   UROBILINOGEN 0.2 07/31/2016 1012  NITRITE NEGATIVE 09/03/2021 1821   LEUKOCYTESUR LARGE (A) 09/03/2021 1821   Sepsis Labs: '@LABRCNTIP' (procalcitonin:4,lacticidven:4)  ) Recent Results (from the past 240 hour(s))  Blood Culture (routine x 2)     Status: None   Collection Time: 09/03/21  6:30 PM   Specimen: BLOOD  Result Value Ref Range Status   Specimen Description BLOOD SITE NOT SPECIFIED  Final   Special Requests   Final    BOTTLES DRAWN AEROBIC AND ANAEROBIC Blood Culture adequate volume   Culture   Final    NO GROWTH 5 DAYS Performed at [facility], [address] [place], [place] [address]    Report Status 09/08/2021 FINAL  Final  Blood Culture (routine x 2)     Status: None   Collection Time: 09/03/21  6:43 PM   Specimen: BLOOD RIGHT HAND  Result Value Ref Range Status    Specimen Description BLOOD RIGHT HAND  Final   Special Requests   Final    BOTTLES DRAWN AEROBIC AND ANAEROBIC Blood Culture results may not be optimal due to an inadequate volume of blood received in culture bottles   Culture   Final    NO GROWTH 5 DAYS Performed at [facility], [place] [address], [place], [place] [address]    Report Status 09/08/2021 FINAL  Final  Resp Panel by RT-PCR (Flu A&B, Covid) Anterior Nasal Swab     Status: None   Collection Time: 09/03/21  6:59 PM   Specimen: Anterior Nasal Swab  Result Value Ref Range Status   SARS Coronavirus 2 by RT PCR NEGATIVE NEGATIVE Final    Comment: (NOTE) SARS-CoV-2 target nucleic acids are NOT DETECTED.  The SARS-CoV-2 RNA is generally detectable in upper respiratory specimens during the acute phase of infection. The lowest concentration of SARS-CoV-2 viral copies this assay can detect is 138 copies/mL. A negative result does not preclude SARS-Cov-2 infection and should not be used as the sole basis for treatment or other patient management decisions. A negative result may occur with  improper specimen collection/handling, submission of specimen other than nasopharyngeal swab, presence of viral mutation(s) within the areas targeted by this assay, and inadequate number of viral copies(<138 copies/mL). A negative result must be combined with clinical observations, patient history, and epidemiological information. The expected result is Negative.  Fact Sheet for Patients:  EntrepreneurPulse.com.au  Fact Sheet for Healthcare Providers:  IncredibleEmployment.be  This test is no t yet approved or cleared by the [place] FDA and  has been authorized for detection and/or diagnosis of SARS-CoV-2 by FDA under an Emergency Use Authorization (EUA). This EUA will remain  in effect (meaning this test can be used) for the duration of the COVID-19 declaration under Section 564(b)(1) of the Act,  21 U.S.C.section 360bbb-3(b)(1), unless the authorization is terminated  or revoked sooner.       Influenza A by PCR NEGATIVE NEGATIVE Final   Influenza B by PCR NEGATIVE NEGATIVE Final    Comment: (NOTE) The Xpert Xpress SARS-CoV-2/FLU/RSV plus assay is intended as an aid in the diagnosis of influenza from Nasopharyngeal swab specimens and should not be used as a sole basis for treatment. Nasal washings and aspirates are unacceptable for Xpert Xpress SARS-CoV-2/FLU/RSV testing.  Fact Sheet for Patients: EntrepreneurPulse.com.au  Fact Sheet for Healthcare Providers: IncredibleEmployment.be  This test is not yet approved or cleared by the [place] FDA and has been authorized for detection and/or diagnosis of SARS-CoV-2 by FDA under an Emergency Use Authorization (EUA).  This EUA will remain in effect (meaning this test can be used) for the duration of the COVID-19 declaration under Section 564(b)(1) of the Act, 21 U.S.C. section 360bbb-3(b)(1), unless the authorization is terminated or revoked.  Performed at [facility], [place] [address] [place], [place] [address]   Urine Culture     Status: Abnormal   Collection Time: 09/03/21  7:05 PM   Specimen: In/Out Cath Urine  Result Value Ref Range Status   Specimen Description IN/OUT CATH URINE  Final   Special Requests   Final    NONE Performed at [facility], [place] [address] [place], [place] [address]    Culture >=100,000 COLONIES/mL ESCHERICHIA COLI (A)  Final   Report Status 09/06/2021 FINAL  Final   Organism ID, Bacteria ESCHERICHIA COLI (A)  Final      Susceptibility   Escherichia coli - MIC*    AMPICILLIN >=32 RESISTANT Resistant     CEFAZOLIN 8 SENSITIVE Sensitive     CEFEPIME <=0.12 SENSITIVE Sensitive     CEFTRIAXONE <=0.25 SENSITIVE Sensitive     CIPROFLOXACIN <=0.25 SENSITIVE Sensitive     GENTAMICIN <=1 SENSITIVE Sensitive     IMIPENEM <=0.25 SENSITIVE  Sensitive     NITROFURANTOIN <=16 SENSITIVE Sensitive     TRIMETH/SULFA <=20 SENSITIVE Sensitive     AMPICILLIN/SULBACTAM 16 INTERMEDIATE Intermediate     PIP/TAZO <=4 SENSITIVE Sensitive     * >=100,000 COLONIES/mL ESCHERICHIA COLI     Radiology Studies: No results found.   Scheduled Meds:  allopurinol  100 mg Oral Daily   amiodarone  200 mg Oral Daily   atorvastatin  80 mg Oral QHS   cephALEXin  250 mg Oral QID   diltiazem  180 mg Oral Daily   escitalopram  10 mg Oral Daily   fentaNYL  1 patch Transdermal Q72H   fentaNYL  1 patch Transdermal Q72H   furosemide  40 mg Oral Daily   pantoprazole  40 mg Oral BID   predniSONE  10 mg Oral Q breakfast   QUEtiapine  25 mg Oral QHS   Continuous Infusions:     LOS: 5 days    Time spent: 99mn  PDomenic Polite MD [facility]   09/08/2021, 10:17 AM

## 2021-09-08 NOTE — Plan of Care (Signed)
  Problem: Clinical Measurements: Goal: Respiratory complications will improve Outcome: Progressing Goal: Cardiovascular complication will be avoided Outcome: Progressing   Problem: Nutrition: Goal: Adequate nutrition will be maintained Outcome: Progressing   Problem: Elimination: Goal: Will not experience complications related to bowel motility Outcome: Progressing Goal: Will not experience complications related to urinary retention Outcome: Progressing   

## 2021-09-08 NOTE — Progress Notes (Addendum)
[facility] Consult: 10:32 AM 09/08/2021  LOS: 5 days    Referring Provider: Dr Broadus John  Primary Care Physician:  Alvester Morin, MD Primary Gastroenterologist:  Dr. Loletha Carrow.       Reason for Consultation: Maroon, bloody stool, recurrent.   HPI: Betty Jackson is a 87 y.o. female.  SNF resident.  PMH listing below but includes COPD, emphysema.  PAF.  Chronic Eliquis.  Viral pericarditis, pericardial window 2020.  Osteoporosis. CAD, stenting 2016.  CVA. Diastolic heart failure.  Giant cell arteritis, on daily prednisone.  Arthritis on Sulfasalazine.  Lumbar, sacral spine fractures.  S/p lumbar fusion.  Chronic back pain on fentanyl patch.  S/p right total hip replacement.  Labeled as vocal cord dysfunction in 2014.  Sigmoid diverticulitis 12/2018.   C. difficile colitis March 2021 treated with vancomycin pulsed taper with resolution of diarrhea.  [place] anemia. Received PRBCs in 02/2019, 09/2019.  GERD.  Encephalopathy attributed to polypharmacy.  AKI.  FTT.   She is on multiple chronic pain meds.  Wheelchair-bound at baseline.   Colonoscopy in [place] > 10 yers ago, findings unknown   Admission 5/9-5/15/2023 with dysphagia.  A-fib, RVR, respiratory failure with hypoxia due to aspiration pneumonia.  Treated with Unasyn... Augmentin.  Normocytic anemia with Hb drop from 11.5-8.8.  CKD stage IIIb.  Hgb 8.8 at discharge.  08/05/2021 EGD.  For odynophagia, dysphagia.  Dr. Loletha Carrow encountered severe esophagitis with associated contact bleeding and adherent blood clot, difficult to assess underlying mucosa which appeared heaped at multiple locations especially at [facility].  Food present in middle third of esophagus but not impacted, removed with water flush and scope push.  No biopsies taken due to friability and recent Eliquis.   Diffuse gastritis with erythema and adherent blood.  Dr. Loletha Carrow suspected alendronate was probable culprit as findings more severe than expected from GERD alone. Medical management consisted of maximum PPI, Carafate, viscous lidocaine Hospital admission was complicated by A-fib, RVR, controlled with Cardizem drip and converted with amiodarone.  Elevated troponins attributed to demand ischemia, no diagnostic cardiac work-up performed.  Discharged to SNF.  Office follow-up with Dr. Loletha Carrow 08/26/2021.  Compliant with Protonix 40 mg twice daily, Carafate tablet 4 times a day and iron supplement.  Dysphagia had improved though occasional sense that food/liquid was hanging up at level of her throat but no further odynophagia.  Following a soft diet.  Complained of lower extremity edema and cardiology increased her Lasix to 80 bid about a week ago.  Set up for repeat EGD on 7/17 with hold of Eliquis 24 hours prior to procedure, approved by her cardiologist.  Patient readmitted 6/7 with plans for discharge yesterday.  Diagnosis sepsis due to E. coli UTI.  Treated with Rocephin.Marland Kitchen  Keflex.  AKI superimposed on CKD.  Resolved loose stools.  3 days ago had small episode of maroon stool, yesterday afternoon she passed a bloody/maroon stool.  Denies nausea, vomiting.  Still has occasional persistent liquid/solid dysphagia where it feels like food is delayed and temporarily sticking throughout the esophagus.  Has not had  any reflux, regurgitation.  May have had some slight bloody stool at SNF but she is not sure.  Denies abdominal pain.  Odynophagia resolved.  Intake med review notes that she was no longer taking Carafate but Protonix 40 bid and prednisone 20 mg/day still in use. Last dose of Eliquis was yesterday morning, on hold since then.  Hgb 11.7 at arrival..  8.9-day 3... 10.1 yesterday afternoon..  9.7 early this morning. CT chest shows left atelectasis, PNA not excluded.  Several pulmonary nodules measuring up to  5 mm.  No follow-up needed if low risk, follow-up CT in 1 year if high risk.  Age-indeterminate thoracic 7 compression fracture new compared with CT of January 2021    Past Medical History:  Diagnosis Date   Acute respiratory failure with hypoxia ([place]) 05/20/2017   Anemia, unspecified 10/28/2012   Anxiety state, unspecified 10/28/2012   CAD (coronary artery disease)    a. Stent RCA in [facility];  b. Cath approx 2009 - nonobs per pt report.   Cataract    immature on the left eye   Chronic insomnia 02/07/2013   Chronic lower back pain    scoliosis   CKD (chronic kidney disease) stage 3, GFR 30-59 ml/min (HCC)    CVA (cerebral infarction) 10/29/2012   DDD (degenerative disc disease)    Diverticulosis    GERD (gastroesophageal reflux disease) 09/01/2010   Hemorrhoids    Herniated nucleus pulposus, L5-S1, right 11/04/2015   Hyperlipidemia    takes Lipitor daily   Hypertension    takes Amlodipine,Losartan,Metoprolol,and HCTZ daily   Incontinence of urine    Insomnia    takes Restoril nightly   Lumbar stenosis 04/25/2013   Major depressive disorder, recurrent episode, moderate ([place]) 07/16/2013   Osteoporosis    PAF (paroxysmal atrial fibrillation) ([place]) 2011   a. lone epidode in 2011 according to notes.   Rheumatoid arthritis ([place])    Scoliosis    Sepsis ([place]) 05/2019   Stroke ([place])    Temporal arteritis ([place]) 2011   a. followed @ [place]; potential flareup 10/28/2012/notes 10/28/2012   Vocal cord dysfunction    "they don't operate properly" (10/28/2012)    Past Surgical History:  Procedure Laterality Date   ABDOMINAL HYSTERECTOMY  ~ 1984   vaginally   BACK SURGERY  7-76yr ago   X Stop   BLADDER SUSPENSION  2001   BREAST BIOPSY Right    CATARACT EXTRACTION W/ INTRAOCULAR LENS IMPLANT Right ~ 08/2012   CHEST TUBE INSERTION Left 03/08/2019   Procedure: Chest Tube Insertion;  Surgeon: VIvin Poot MD;  [facility]  Service: Thoracic;  Laterality: Left;    COLONOSCOPY  01/26/2012   Procedure: COLONOSCOPY;  Surgeon: MLadene Artist MD,FACG;  [facility]  Service: Endoscopy;  Laterality: N/A;  note the EGD is possible   CORONARY ANGIOPLASTY WITH STENT PLACEMENT  2006   X 1 stent   EPIDURAL BLOCK INJECTION     ESOPHAGOGASTRODUODENOSCOPY  01/26/2012   Procedure: ESOPHAGOGASTRODUODENOSCOPY (EGD);  Surgeon: MLadene Artist MD,FACG;  [facility];  Service: Endoscopy;  Laterality: N/A;   ESOPHAGOGASTRODUODENOSCOPY (EGD) WITH PROPOFOL N/A 08/05/2021   Procedure: ESOPHAGOGASTRODUODENOSCOPY (EGD) WITH PROPOFOL;  Surgeon: DDoran Stabler MD;  [facility]  Service: Gastroenterology;  Laterality: N/A;   FINE NEEDLE ASPIRATION Right 09/26/2019   Procedure: FINE NEEDLE ASPIRATION;  Surgeon: HAltamese Palmarejo MD;  [facility]  Service: Orthopedics;  Laterality: Right;   FOREIGN BODY REMOVAL  08/05/2021  Procedure: FOREIGN BODY REMOVAL;  Surgeon: Doran Stabler, MD;  [facility];  Service: Gastroenterology;;   HEMIARTHROPLASTY HIP Right 2012   IR EPIDUROGRAPHY  04/20/2019   LAPAROSCOPIC CHOLECYSTECTOMY  2001   LUMBAR FUSION  03/2013   LUMBAR LAMINECTOMY/DECOMPRESSION MICRODISCECTOMY Right 11/04/2015   Procedure: Right Lumbar Five-Sacral One Microdiskectomy;  Surgeon: Kristeen Miss, MD;  Location: [place] NEURO ORS;  Service: Neurosurgery;  Laterality: Right;  Right L5-S1 Microdiskectomy   moles removed that required stiches     one on leg and one on face   ORIF FEMUR FRACTURE Right 09/26/2019   Procedure: OPEN REDUCTION INTERNAL FIXATION (ORIF) DISTAL FEMUR FRACTURE;  Surgeon: Altamese Barnes, MD;  Location: [place];  Service: Orthopedics;  Laterality: Right;   ORIF FEMUR FRACTURE Right 10/17/2019   Procedure: OPEN REDUCTION INTERNAL FIXATION (ORIF) DISTAL FEMUR FRACTURE;  Surgeon: Altamese Ballou, MD;  Location: [place];  Service: Orthopedics;  Laterality: Right;   SUBXYPHOID PERICARDIAL WINDOW N/A 03/08/2019   Procedure:  SUBXYPHOID PERICARDIAL WINDOW;  Surgeon: Ivin Poot, MD;  Location: [place];  Service: Thoracic;  Laterality: N/A;   TEE WITHOUT CARDIOVERSION N/A 03/08/2019   Procedure: TRANSESOPHAGEAL ECHOCARDIOGRAM (TEE);  Surgeon: Prescott Gum, Collier Salina, MD;  Location: [place];  Service: Thoracic;  Laterality: N/A;   TEMPORAL ARTERY BIOPSY / LIGATION Bilateral 2011   TONSILLECTOMY AND ADENOIDECTOMY     at age 13   [place]  ~ [place]  ~ 2010   "lower back" (10/28/2012)    Prior to Admission medications   Medication Sig Start Date End Date Taking? Authorizing Provider  acetaminophen (TYLENOL) 500 MG tablet Take 500 mg by mouth every 6 (six) hours as needed for mild pain.   Yes [provider]  allopurinol (ZYLOPRIM) 100 MG tablet Take 100 mg by mouth daily.   Yes [provider]  amiodarone (PACERONE) 200 MG tablet Take '200mg'$  twice daily for 5 days, then take '200mg'$  once daily. Patient taking differently: Take 200 mg by mouth daily. 08/11/21  Yes Bonnielee Haff, MD  amoxicillin (AMOXIL) 500 MG tablet Take 500 mg by mouth See admin instructions. Tid x 19 days   Yes [provider]  atorvastatin (LIPITOR) 80 MG tablet Take 1 tablet (80 mg total) by mouth daily. Patient taking differently: Take 80 mg by mouth at bedtime. 09/25/20  Yes Garvin Fila, MD  cetirizine (ZYRTEC) 10 MG tablet Take 10 mg by mouth at bedtime.   Yes [provider]  cholecalciferol (VITAMIN D) 25 MCG tablet Take 2 tablets (2,000 Units total) by mouth 2 (two) times daily. 09/29/19  Yes Ainsley Spinner, PA-C  diltiazem (CARDIZEM CD) 180 MG 24 hr capsule Take 180 mg by mouth daily. 09/01/21  Yes [provider]  ELIQUIS 5 MG TABS tablet TAKE 1 TABLET BY MOUTH TWICE A DAY Patient taking differently: Take 5 mg by mouth 2 (two) times daily. 05/09/20  Yes Burnell Blanks, MD  escitalopram (LEXAPRO) 10 MG tablet TAKE 1 TABLET BY MOUTH EVERY DAY Patient taking differently: Take 10  mg by mouth daily. 08/27/20  Yes Burchette, Alinda Sierras, MD  fentaNYL 37.5 MCG/HR PT72 Place 1 patch onto the skin every 3 (three) days. 08/10/21  Yes Bonnielee Haff, MD  ferrous sulfate 325 (65 FE) MG tablet Take 1 tablet (325 mg total) by mouth 2 (two) times daily with a meal. 04/21/19  Yes Darrick Meigs, Marge Duncans, MD  furosemide (LASIX) 40 MG tablet Take 40 mg by  mouth daily as needed (weight gain of 2 pounds overnight or 5 pound in 1 week).   Yes [provider]  furosemide (LASIX) 80 MG tablet Take 80 mg by mouth See admin instructions. Qd x 2 days   Yes [provider]  HYDROcodone-acetaminophen (NORCO) 5-325 MG tablet Take 1 tablet by mouth every 12 (twelve) hours as needed for moderate pain. 08/10/21  Yes Bonnielee Haff, MD  ipratropium-albuterol (DUONEB) 0.5-2.5 (3) MG/3ML SOLN Take 3 mLs by nebulization every 4 (four) hours as needed. Patient taking differently: Take 3 mLs by nebulization every 4 (four) hours as needed (wheezing/shortness of breath). 08/10/21  Yes Bonnielee Haff, MD  losartan (COZAAR) 50 MG tablet Take 50 mg by mouth daily. 08/27/21  Yes [provider]  Nutritional Supplement LIQD Take 120 mLs by mouth daily. House stock supplement   Yes [provider]  ondansetron (ZOFRAN) 4 MG tablet Take 4 mg by mouth every 8 (eight) hours as needed for nausea or vomiting.   Yes [provider]  pantoprazole (PROTONIX) 40 MG tablet Take 1 tablet (40 mg total) by mouth 2 (two) times daily. 08/11/21  Yes Bonnielee Haff, MD  potassium chloride (KLOR-CON) 10 MEQ tablet Take 1 tablet (10 mEq total) by mouth daily. 06/07/20  Yes Burchette, Alinda Sierras, MD  predniSONE (DELTASONE) 10 MG tablet Take 1 tablet (10 mg total) by mouth daily with breakfast. Resume after 3 days of '20mg'$  daily as instructed. 08/15/21  Yes Bonnielee Haff, MD  QUEtiapine (SEROQUEL) 25 MG tablet Take 1 tablet (25 mg total) by mouth at bedtime. 03/16/21 09/03/21 Yes Pahwani, Einar Grad, MD  sulfaSALAzine  (AZULFIDINE) 500 MG EC tablet Take 500 mg by mouth daily.   Yes [provider]  amoxicillin (AMOXIL) 500 MG capsule Take 500 mg by mouth See admin instructions. Tid x 7 days Patient not taking: Reported on 09/03/2021    [provider]  cephALEXin (KEFLEX) 250 MG capsule Take 1 capsule (250 mg total) by mouth every 6 (six) hours for 4 days. 09/07/21 09/11/21  Domenic Polite, MD  furosemide (LASIX) 40 MG tablet Take 40 mg by mouth daily.    [provider]  Multiple Vitamin (MULTIVITAMIN WITH MINERALS) TABS tablet Take 1 tablet by mouth daily. Patient not taking: Reported on 09/03/2021 10/21/19   Ainsley Spinner, PA-C  sucralfate (CARAFATE) 1 GM/10ML suspension Take 10 mLs (1 g total) by mouth 3 (three) times daily after meals, bedtime and 0200. Patient not taking: Reported on 09/03/2021 08/11/21   Bonnielee Haff, MD    Scheduled Meds:  allopurinol  100 mg Oral Daily   amiodarone  200 mg Oral Daily   atorvastatin  80 mg Oral QHS   cephALEXin  250 mg Oral QID   diltiazem  180 mg Oral Daily   escitalopram  10 mg Oral Daily   fentaNYL  1 patch Transdermal Q72H   fentaNYL  1 patch Transdermal Q72H   furosemide  40 mg Oral Daily   pantoprazole  40 mg Oral BID   predniSONE  10 mg Oral Q breakfast   QUEtiapine  25 mg Oral QHS   Infusions:  PRN Meds: acetaminophen, HYDROcodone-acetaminophen, ondansetron (ZOFRAN) IV   Allergies as of 09/03/2021 - Review Complete 09/03/2021  Allergen Reaction Noted   Dextromethorphan Rash 06/24/2013    Family History  Problem Relation Age of Onset   Brain cancer Mother 38   Heart attack Father    Breast cancer Daughter 69   Heart disease Other  Hypertension Other    Arthritis Neg Hx    Colon cancer Neg Hx    Osteoporosis Neg Hx     Social History   Socioeconomic History   Marital status: Married    Spouse name: Not on file   Number of children: 3   Years of education: Not on file   Highest education level: Not on file   Occupational History   Occupation: Retired Cabin crew  Tobacco Use   Smoking status: Never   Smokeless tobacco: Never  Vaping Use   Vaping Use: Never used  Substance and Sexual Activity   Alcohol use: Yes    Comment: socially   Drug use: No   Sexual activity: Not on file  Other Topics Concern   Not on file  Social History Narrative   Lives at [place] facility, [employer]   Right Handed   Drinks 1-2 cups caffeine daily   Social Determinants of Health   Financial Resource Strain: Low Risk  (07/16/2020)   Overall Financial Resource Strain (CARDIA)    Difficulty of Paying Living Expenses: Not hard at all  Food Insecurity: No Food Insecurity (07/16/2020)   Hunger Vital Sign    Worried About Running Out of Food in the Last Year: Never true    [place] in the Last Year: Never true  Transportation Needs: No Transportation Needs (07/16/2020)   PRAPARE - Hydrologist (Medical): No    Lack of Transportation (Non-Medical): No  Physical Activity: Inactive (07/16/2020)   Exercise Vital Sign    Days of Exercise per Week: 0 days    Minutes of Exercise per Session: 0 min  Stress: Not on file  Social Connections: Unknown (07/16/2020)   Social Connection and Isolation Panel [NHANES]    Frequency of Communication with Friends and Family: More than three times a week    Frequency of Social Gatherings with Friends and Family: More than three times a week    Attends Religious Services: Never    Marine scientist or Organizations: No    Attends Archivist Meetings: Never    Marital Status: Patient refused  Intimate Partner Violence: Not At Risk (07/16/2020)   Humiliation, Afraid, Rape, and Kick questionnaire    Fear of Current or Ex-Partner: No    Emotionally Abused: No    Physically Abused: No    Sexually Abused: No    REVIEW OF SYSTEMS: Constitutional: No profound weakness or fatigue but generalized weakness is chronic. ENT: Chronic jaw  pain following dental extractions about 4 months previously. Pulm: Denies shortness of breath currently.  No cough currently. CV:  No palpitations, no angina.  Lower extremity edema overall improved from a few weeks ago. GU:  No hematuria, no frequency GI: See HPI. Heme: Other than the darkish, bloody stool, no other unusual bleeding or bruising Transfusions: See HPI. Neuro:  No headaches, no peripheral tingling or numbness.  No dizziness, no falls. Derm:  No itching, no rash or sores.  Endocrine:  No sweats or chills.  No polyuria or dysuria Immunization: Reviewed Travel:  None    PHYSICAL EXAM: Vital signs in last 24 hours: Vitals:   09/08/21 0532 09/08/21 0938  BP: (!) 176/71 (!) 185/69  Pulse: (!) 55 69  Resp: 18 17  Temp: 98.3 F (36.8 C) 98.6 F (37 C)  SpO2: 94% 93%   Wt Readings from Last 3 Encounters:  09/04/21 82.7 kg  09/02/21 78.1 kg  08/26/21  78 kg    General: Patient is overweight, looks chronically and somewhat acutely unwell but is comfortable, alert and not toxic appearing Head: Facial asymmetry or swelling.  No signs of head trauma PO Eyes: Conjunctiva pale.  EOMI. Ears: Not hard of hearing Nose: No congestion or discharge Mouth: At least half of her native teeth are absent.  Mucosa is pink, moist, clear.  No ulcers or lesions Neck: No JVD, no masses, no thyromegaly Lungs: Globally diminished breath sounds but clear bilaterally.  No cough.  No labored breathing at rest or with speech. Heart: RRR.  No MRG.  S1, S2 present.  NSR at 66 on telemetry monitor. Abdomen: Nontender, not distended.  Soft.  No HSM, masses, bruits, hernias.   Rectal: Deferred. Musc/Skeltl: No joint redness, swelling or gross deformity. Extremities: 1+ pedal/ankle edema, lesser pretibial edema. Neurologic: Alert and oriented x3.  Speech is clear.  Follows commands and moves all 4 limbs without tremor.  Strength not tested. Skin: No suspicious sores, rashes, AVMs encountered. Nodes:  No cervical adenopathy Psych: Pleasant, calm, cooperative.  Intake/Output from previous day: 06/11 0701 - 06/12 0700 In: 1177 [P.O.:1177] Out: 2900 [Urine:2900] Intake/Output this shift: No intake/output data recorded.  LAB RESULTS: Recent Labs    09/07/21 0449 09/07/21 1548 09/08/21 0243  WBC 10.8* 9.3 10.2  HGB 9.2* 10.1* 9.7*  HCT 28.7* 32.2* 31.0*  PLT 274 285 277   BMET Lab Results  Component Value Date   NA 141 09/08/2021   NA 141 09/07/2021   NA 136 09/06/2021   K 3.6 09/08/2021   K 4.0 09/07/2021   K 4.7 09/06/2021   CL 102 09/08/2021   CL 106 09/07/2021   CL 103 09/06/2021   CO2 30 09/08/2021   CO2 27 09/07/2021   CO2 22 09/06/2021   GLUCOSE 103 (H) 09/08/2021   GLUCOSE 91 09/07/2021   GLUCOSE 147 (H) 09/06/2021   BUN 21 09/08/2021   BUN 25 (H) 09/07/2021   BUN 29 (H) 09/06/2021   CREATININE 1.42 (H) 09/08/2021   CREATININE 1.34 (H) 09/07/2021   CREATININE 1.56 (H) 09/06/2021   CALCIUM 8.2 (L) 09/08/2021   CALCIUM 8.0 (L) 09/07/2021   CALCIUM 7.6 (L) 09/06/2021   LFT No results for input(s): "PROT", "ALBUMIN", "AST", "ALT", "ALKPHOS", "BILITOT", "BILIDIR", "IBILI" in the last 72 hours. PT/INR Lab Results  Component Value Date   INR 1.7 (H) 09/03/2021   INR 1.2 07/16/2020   INR 1.1 07/14/2020   Hepatitis Panel No results for input(s): "HEPBSAG", "HCVAB", "HEPAIGM", "HEPBIGM" in the last 72 hours. C-Diff No components found for: "CDIFF" Lipase     Component Value Date/Time   LIPASE 30 09/04/2021 0341    Drugs of Abuse     Component Value Date/Time   LABOPIA POSITIVE (A) 07/17/2020 0231   COCAINSCRNUR NONE DETECTED 07/17/2020 0231   LABBENZ POSITIVE (A) 07/17/2020 0231   AMPHETMU NONE DETECTED 07/17/2020 0231   THCU NONE DETECTED 07/17/2020 0231   LABBARB NONE DETECTED 07/17/2020 0231     RADIOLOGY STUDIES: No results found.   IMPRESSION:   Maroon/bloody stool without abdominal pain or nausea and vomiting. EGD for evaluation  of odyno/dysphagia 08/05/2021 revealed severe esophagitis with bleeding, retained food in mid esophagus, diffuse gastritis with adherent blood.  MD suspected alendronate precipitated the problem.  No biopsies obtained due to friable mucosa.  Initially on bid pantoprazole, Carafate.  However at some point in the last few weeks Carafate discontinued.  Repeat EGD planned for 7/17 at  [facility] swallowing problems improved but has had a couple of episodes of maroon stool in the last 36 hours with slight drop in Hgb.   Previous colonoscopy in [place], more than 10 years ago.    Chronic Eliquis.  Held as of yesterday a.m.'s dose.   History A-fib.    [place] anemia.  On bid po iron PTA.  Has history of IDA back in 03/2019 with normal iron and ferritin levels 05/2019.  E. coli UTI with sepsis this admission.  Chronic oral pain following dental extractions 4 months ago.    Arthritis, Chronic pain.  On fentanyl patch, Prednisone, sulfasalazine.      PLAN:     As pt had breakfast this morning, placed orders for EGD in [place] for a tomorrow case, timing TBD.  This plan needs to be confirmed by Dr. Carlean Purl.     Azucena Freed  09/08/2021, 10:32 AM Phone [phone]

## 2021-09-09 ENCOUNTER — Encounter (HOSPITAL_COMMUNITY)
Admission: EM | Disposition: A | Discharge status: Skilled Nursing Facility | Payer: Self-pay | Source: Home / Self Care | Attending: Internal Medicine

## 2021-09-09 DIAGNOSIS — A419 Sepsis, unspecified organism: Secondary | ICD-10-CM | POA: Diagnosis not present

## 2021-09-09 DIAGNOSIS — N390 Urinary tract infection, site not specified: Secondary | ICD-10-CM | POA: Diagnosis not present

## 2021-09-09 LAB — CBC
HCT: 32.4 % — ABNORMAL LOW (ref 36.0–46.0)
Hemoglobin: 10.2 g/dL — ABNORMAL LOW (ref 12.0–15.0)
MCH: 28.7 pg (ref 26.0–34.0)
MCHC: 31.5 g/dL (ref 30.0–36.0)
MCV: 91.3 fL (ref 80.0–100.0)
Platelets: 324 10*3/uL (ref 150–400)
RBC: 3.55 MIL/uL — ABNORMAL LOW (ref 3.87–5.11)
RDW: 14.6 % (ref 11.5–15.5)
WBC: 13 10*3/uL — ABNORMAL HIGH (ref 4.0–10.5)
nRBC: 0.5 % — ABNORMAL HIGH (ref 0.0–0.2)

## 2021-09-09 LAB — BASIC METABOLIC PANEL
Anion gap: 9 (ref 5–15)
BUN: 18 mg/dL (ref 8–23)
CO2: 31 mmol/L (ref 22–32)
Calcium: 8.7 mg/dL — ABNORMAL LOW (ref 8.9–10.3)
Chloride: 98 mmol/L (ref 98–111)
Creatinine, Ser: 1.14 mg/dL — ABNORMAL HIGH (ref 0.44–1.00)
GFR, Estimated: 47 mL/min — ABNORMAL LOW (ref >60)
Glucose, Bld: 84 mg/dL (ref 70–99)
Potassium: 3.3 mmol/L — ABNORMAL LOW (ref 3.5–5.1)
Sodium: 138 mmol/L (ref 135–145)

## 2021-09-09 SURGERY — ESOPHAGOGASTRODUODENOSCOPY (EGD) WITH PROPOFOL
Anesthesia: Monitor Anesthesia Care

## 2021-09-09 NOTE — TOC Progression Note (Signed)
Transition of Care [facility]) - Initial/Assessment Note    Patient Details  Name: Betty Jackson MRN: [id number] Date of Birth: 1934/04/01  Transition of Care [facility]) CM/SW Contact:    Milinda Antis, [place] Phone Number: 09/09/2021, 3:18 PM  Clinical Narrative:                 Transition of Care Department [facility]) has reviewed patient and patient can return to [facility] (SNF) in [facility] when medically ready.  TOC will continue to follow for d/c needs.     Expected Discharge Plan: Skilled Nursing Facility Barriers to Discharge: Continued Medical Work up   Patient Goals and CMS Choice Patient states their goals for this hospitalization and ongoing recovery are:: to return to rehab      Expected Discharge Plan and Services Expected Discharge Plan: [place] arrangements for the past 2 months: [place]                                      Prior Living Arrangements/Services Living arrangements for the past 2 months: [place] Lives with:: Facility Resident Patient language and need for interpreter reviewed:: Yes Do you feel safe going back to the place where you live?: Yes      Need for Family Participation in Patient Care: Yes (Comment) Care giver support system in place?: Yes (comment)   Criminal Activity/Legal Involvement Pertinent to Current Situation/Hospitalization: No - Comment as needed  Activities of Daily Living Home Assistive Devices/Equipment: Environmental consultant (specify type), Wheelchair, Grab bars in shower, Grab bars around toilet, Hand-held shower hose, Shower chair with back ADL Screening (condition at time of admission) Patient's cognitive ability adequate to safely complete daily activities?: Yes Is the patient deaf or have difficulty hearing?: No Does the patient have difficulty seeing, even when wearing glasses/contacts?: No Does the patient have difficulty concentrating, remembering, or making  decisions?: No Patient able to express need for assistance with ADLs?: Yes Does the patient have difficulty dressing or bathing?: Yes Independently performs ADLs?: No Communication: Independent Dressing (OT): Needs assistance Is this a change from baseline?: Pre-admission baseline Grooming: Needs assistance Is this a change from baseline?: Pre-admission baseline Feeding: Independent Bathing: Needs assistance Is this a change from baseline?: Pre-admission baseline Toileting: Needs assistance Is this a change from baseline?: Pre-admission baseline In/Out Bed: Needs assistance Is this a change from baseline?: Pre-admission baseline Walks in Home: Needs assistance Is this a change from baseline?: Pre-admission baseline Does the patient have difficulty walking or climbing stairs?: Yes Weakness of Legs: Both Weakness of Arms/Hands: Both  Permission Sought/Granted Permission sought to share information with : Other (comment) (CSW)    Share Information with NAME: daughter [name]  Permission granted to share info w AGENCY: SNF        Emotional Assessment   Attitude/Demeanor/Rapport: Engaged   Orientation: : Oriented to Self, Oriented to Place Alcohol / Substance Use: Not Applicable Psych Involvement: No (comment)  Admission diagnosis:  Acute kidney injury ([place]) [N17.9] Sepsis ([place]) [A41.9] Severe sepsis ([place]) [A41.9, R65.20] Urinary tract infection without hematuria, site unspecified [N39.0] Patient Active Problem List   Diagnosis Date Noted   Hematochezia    Odynophagia    Esophagitis    Lung nodules 09/04/2021   Sepsis secondary to UTI ([place]) 09/03/2021   Atrial fibrillation with RVR ([place]) 08/06/2021   Dysphagia 08/05/2021  Acute metabolic encephalopathy 00/76/2263   Hypoxemia 03/15/2021   Acute kidney injury superimposed on chronic kidney disease ([place]) 03/15/2021   Stroke-like symptoms 07/14/2020   Cough 05/24/2020   Dyspnea 05/24/2020   Optic neuritis 05/24/2020    Osteopenia 05/24/2020   Rash 05/24/2020   Vitamin D deficiency 05/24/2020   Palliative care by specialist    Goals of care, counseling/discussion    DNR (do not resuscitate)    Weakness    Advanced directives, counseling/discussion    Pressure injury of skin 10/18/2019   Closed fracture of right distal femur ([place]) 10/17/2019   Femur fracture, right ([place]) 09/25/2019   Chronic diastolic CHF (congestive heart failure) ([place]) 09/25/2019   HCAP (healthcare-associated pneumonia) 06/19/2019   Encephalopathy 06/19/2019   Lumbar degenerative disc disease    Paroxysmal A-fib ([place])    Hypomagnesemia    C. difficile colitis    Diarrhea 05/29/2019   CHF (congestive heart failure) ([place]) 05/29/2019   Recurrent falls 04/30/2019   Pericardial effusion without cardiac tamponade 03/29/2019   Hypoalbuminemia 03/27/2019   Cardiac tamponade    Pericarditis 03/06/2019   Fever    Septic shock ([place])    Atypical chest pain    Atrial fibrillation with rapid ventricular response (HCC)    Nausea and vomiting 01/17/2019   Acute diverticulitis 01/13/2019   Chronic back pain 01/13/2019   Tachycardia 01/13/2019   Hypokalemia 01/13/2019   Chronic low back pain 07/10/2018   Influenza A 05/20/2017   Severe sepsis ([place]) 05/20/2017   Acute respiratory failure with hypoxia ([place]) 05/20/2017   CKD (chronic kidney disease), stage III ([place]) 05/20/2017   PAF (paroxysmal atrial fibrillation) ([place]) 05/20/2017   HTN (hypertension) 05/20/2017   Giant cell arteritis ([place]) 05/20/2017   Coronary disease 05/20/2017   Abnormal urinalysis 05/20/2017   Osteoporosis 05/08/2016   Herniated nucleus pulposus, L5-S1, right 11/04/2015   Major depressive disorder, recurrent episode, moderate ([place]) 07/16/2013   Abdominal pain, epigastric 06/22/2013   Lumbar stenosis 04/25/2013   Chronic insomnia 02/07/2013   Elevated transaminase level 02/07/2013   CVA (cerebral infarction) 10/29/2012   Visual changes 10/28/2012    Depression 10/28/2012   Anxiety state 10/28/2012   Anemia, unspecified 10/28/2012   Nonspecific (abnormal) findings on radiological and other examination of gastrointestinal tract 01/25/2012   Hyponatremia 01/24/2012   Hematuria 01/24/2012   Enteritis 12/24/2011   Abdominal pain, other specified site 12/24/2011   Leg pain, right 02/18/2011   Rheumatoid arthritis ([place]) 02/04/2011   Temporal arteritis ([place]) 09/01/2010   Hypertension 09/01/2010   Hyperlipidemia 09/01/2010   History of atrial fibrillation 09/01/2010   CAD (coronary artery disease) 09/01/2010   GERD (gastroesophageal reflux disease) 09/01/2010   Insomnia 09/01/2010   PCP:  Alvester Morin, MD Pharmacy:  No Pharmacies Listed    Social Determinants of Health (SDOH) Interventions    Readmission Risk Interventions    10/30/2019    9:48 AM 09/26/2019    4:29 PM 06/21/2019    4:12 PM  Readmission Risk Prevention Plan  Transportation Screening Complete Complete Complete  Medication Review Press photographer) Complete Complete Complete  PCP or Specialist appointment within 3-5 days of discharge Complete Complete Not Complete  PCP/Specialist Appt Not Complete comments   likely SNF placement  HRI or Home Care Consult Complete Complete Not Complete  HRI or Home Care Consult Pt Refusal Comments   likely SNF  SW Recovery Care/Counseling Consult Complete Complete Complete  Palliative Care Screening Complete Complete Complete  Skilled Nursing Facility Not Applicable Complete  Complete

## 2021-09-09 NOTE — H&P (View-Only) (Signed)
   Patient Name: Betty Jackson Date of Encounter: 09/09/2021, 11:48 AM    Subjective  No new c/o - small amount blood w/ BM   Objective  BP (!) 162/76 (BP Location: Left Arm)   Pulse 68   Temp 98 F (36.7 C) (Oral)   Resp 18   Ht 5' 4.5" (1.638 m)   Wt 82.7 kg   SpO2 96%   BMI 30.81 kg/m       Latest Ref Rng & Units 09/09/2021    4:30 AM 09/08/2021    2:43 AM 09/07/2021    3:48 PM  CBC  WBC 4.0 - 10.5 K/uL 13.0  10.2  9.3   Hemoglobin 12.0 - 15.0 g/dL 10.2  9.7  10.1   Hematocrit 36.0 - 46.0 % 32.4  31.0  32.2   Platelets 150 - 400 K/uL 324  277  285      Assessment and Plan  Hematochezia Hx esophagitis w/ odynophagia/dysphagia Eliquis Tx - hels since 6/11 PM (last dose AM 6/1) Ecoli urosepsis  Plan EGD and colonoscopy tromorrow - see 6/12 note for more     Gatha Mayer, MD, [facility] See Shea Evans on call - gastroenterology for best contact person 09/09/2021 11:48 AM

## 2021-09-09 NOTE — Care Management Important Message (Signed)
Important Message  Patient Details  Name: Betty Jackson MRN: [id number] Date of Birth: [birth year]   Medicare Important Message Given:        [name] 09/09/2021, 3:08 PM

## 2021-09-09 NOTE — Progress Notes (Signed)
   Patient Name: Betty Jackson Date of Encounter: 09/09/2021, 11:48 AM    Subjective  No new c/o - small amount blood w/ BM   Objective  BP (!) 162/76 (BP Location: Left Arm)   Pulse 68   Temp 98 F (36.7 C) (Oral)   Resp 18   Ht 5' 4.5" (1.638 m)   Wt 82.7 kg   SpO2 96%   BMI 30.81 kg/m       Latest Ref Rng & Units 09/09/2021    4:30 AM 09/08/2021    2:43 AM 09/07/2021    3:48 PM  CBC  WBC 4.0 - 10.5 K/uL 13.0  10.2  9.3   Hemoglobin 12.0 - 15.0 g/dL 10.2  9.7  10.1   Hematocrit 36.0 - 46.0 % 32.4  31.0  32.2   Platelets 150 - 400 K/uL 324  277  285      Assessment and Plan  Hematochezia Hx esophagitis w/ odynophagia/dysphagia Eliquis Tx - hels since 6/11 PM (last dose AM 6/1) Ecoli urosepsis  Plan EGD and colonoscopy tromorrow - see 6/12 note for more     Gatha Mayer, MD, [facility] See Shea Evans on call - gastroenterology for best contact person 09/09/2021 11:48 AM

## 2021-09-09 NOTE — Progress Notes (Signed)
Physical Therapy Treatment Patient Details Name: Betty Jackson MRN: [id number] DOB: [birth year] Today's Date: 09/09/2021   History of Present Illness 87 y/o female presented to ED  from SNF on 09/03/21 for increasing jaw pain after tooth extraction 04/2021. CT negative for abscess. Admitted for sespsis 2/2 UTI. PMH: first degree AV block, HFpEF, RA, HTN, Afib, CAD    PT Comments    Continuing work on functional mobility and activity tolerance;  Pt politely declining getting up and OOB this afternoon, but agreeable to sit up and wash her face and comb her hair; Mod assist to help elevate trunk to sit; Discussed PLOF and functional mobility goals -- pt hopes to get back to walking in the hallways   Recommendations for follow up therapy are one component of a multi-disciplinary discharge planning process, led by the attending physician.  Recommendations may be updated based on patient status, additional functional criteria and insurance authorization.  Follow Up Recommendations  Skilled nursing-short term rehab (<3 hours/day)     Assistance Recommended at Discharge Frequent or constant Supervision/Assistance  Patient can return home with the following     Equipment Recommendations  None recommended by PT    Recommendations for Other Services       Precautions / Restrictions Precautions Precautions: Fall     Mobility  Bed Mobility Overal bed mobility: Needs Assistance Bed Mobility: Rolling, Sidelying to Sit, Sit to Sidelying Rolling: Min assist Sidelying to sit: Mod assist     Sit to sidelying: Min assist General bed mobility comments: Mod assist to push up from sidelying to sit; Good ability to lift LEs back to bed when it was time to lie back down; mod assist including positioning the bed for gravity assist to slide up in bed and position    Transfers                   General transfer comment: Declining OOB    Ambulation/Gait               General Gait  Details: Declining ambulation   Stairs             Wheelchair Mobility    Modified Rankin (Stroke Patients Only)       Balance     Sitting balance-[name]: Good                                      Cognition Arousal/Alertness: Awake/alert Behavior During Therapy: WFL for tasks assessed/performed Overall Cognitive Status: Within Functional Limits for tasks assessed                                          Exercises      General Comments General comments (skin integrity, edema, etc.): Agreed to get up to EOB and wash her face and comb her hair; No distress noted with sitting up      Pertinent Vitals/Pain Pain Assessment Pain Assessment: No/denies pain    Home Living                          Prior Function            PT Goals (current goals can now be found in the care plan section) Acute Rehab PT Goals  Patient Stated Goal: to get stronger; be able to walk the hallways again PT Goal Formulation: With patient/family Time For Goal Achievement: 09/20/21 Potential to Achieve Goals: Fair Progress towards PT goals: Progressing toward goals (Slowly)    Frequency    Min 2X/week      PT Plan Current plan remains appropriate    Co-evaluation              AM-PAC PT "6 Clicks" Mobility   Outcome Measure  Help needed turning from your back to your side while in a flat bed without using bedrails?: A Little Help needed moving from lying on your back to sitting on the side of a flat bed without using bedrails?: A Lot Help needed moving to and from a bed to a chair (including a wheelchair)?: A Little Help needed standing up from a chair using your arms (e.g., wheelchair or bedside chair)?: A Lot Help needed to walk in hospital room?: A Lot Help needed climbing 3-5 steps with a railing? : Total 6 Click Score: 13    End of Session Equipment Utilized During Treatment: Other (comment) (bed pad) Activity  Tolerance: Patient tolerated treatment well Patient left: in bed;with call bell/phone within reach;with bed alarm set Nurse Communication: Mobility status PT Visit Diagnosis: Unsteadiness on feet (R26.81);Muscle weakness (generalized) (M62.81);Difficulty in walking, not elsewhere classified (R26.2)     Time: 1301-1328 (minus 5 minutes to get socks and comb) PT Time Calculation (min) (ACUTE ONLY): 27 min  Charges:  $Therapeutic Activity: 8-22 mins                     Betty Jackson, PT  Acute Rehabilitation Services Office [phone]    Betty Jackson 09/09/2021, 2:08 PM

## 2021-09-09 NOTE — Progress Notes (Signed)
PROGRESS NOTE    Betty Jackson  DHR:[id number] DOB: 01/06/1935 DOA: 09/03/2021 PCP: Alvester Morin, MD   87 y.o. female from SNF with medical history significant of CAD, hypertension, hyperlipidemia, PAF on Eliquis, CKD stage IIIa, CAD status post DES to RCA in 2005, HFpEF, CVA, rheumatoid arthritis/giant cell arteritis on chronic prednisone, anxiety/depression, chronic insomnia, chronic low back pain, GERD, osteoporosis, chronic hypoxic respiratory failure on baseline 2 L oxygen.  Recently admitted 5/9 for dysphagia and severe esophagitis with bleeding secondary to alendronate which was discontinued, also developed A-fib RVR and aspiration pneumonia . Seen in cardiology clinic 1 day PTA, dose of Lasix was increased to 80 mg daily x2 days due to lower extremity edema. -Presented to the ED 6/7 with weakness, low-grade fevers and malaise -Admitted with sepsis, E. coli UTI, improving on antibiotics -6/11 evening with hematochezia, blood clots in stool -Plan for EGD colonoscopy 6/14  Subjective: -Feels okay, denies any complaints, brown stool with scant blood today  Assessment and Plan:  Sepsis secondary to UTI -met criteria for sepsis with fever, tachycardia, and leukocytosis -Clinically improving on ceftriaxone, urine cultures with E. Coli-pansensitive, blood cultures negative, transitioned to oral Keflex to complete 5-day course  -Off stress dose steroids, back to home regimen of prednisone  -Discharge to SNF when stable  Hematochezia with clots -Multiple episodes this weekend with hematochezia and clots -Eliquis held 6/11 PM, hemoglobin is stable, recent esophagitis, clinically do not suspect upper GI bleed, BUN is stable as well -Followed by [facility], appreciate GI input, plan for EGD and colonoscopy tomorrow   AKI on CKD IIIa -Secondary to sepsis -Improving, hold losartan, Lasix resumed   Recent severe esophagitis -Secondary to bisphosphonates -Continue PPI given severe  esophagitis and gastritis on recent EGD, plan for repeat EGD 7/17   Acute on chronic diastolic CHF  -Appears mildly volume overloaded secondary to fluid resuscitation for sepsis yesterday, echo done in January 2021 showing LVEF 60 to 46%, grade 2 diastolic dysfunction. -Continue Lasix, now p.o., hold losartan  Chronic anemia,  -Hemoglobin stable at baseline   CAD Stable, continue statin   PAF Stable, currently in sinus rhythm. -Continue Amiodarone, and Diltiazem.  Holding Eliquis    Tooth pain Patient had her lower incisors extracted 4 months ago. No gum swelling or obvious signs of oral infection at this time. CT soft tissue neck with contrast showing no significant inflammatory changes or abscess. -Outpatient follow-up with her oral surgeon recommended   Rheumatoid arthritis -Back on on prednisone 10 Mg daily at home dose, stress dose steroids discontinued    Anxiety/ depression Chronic insomnia -Continue Lexapro and Seroquel   Chronic hypoxic respiratory failure on 2L O2 No change in O2 requirement from baseline. -Continue supplemental O2   Multiple lung nodules -Could be secondary to rheumatoid arthritis, needs follow-up   Gout -Continue allopurinol  Chronic pain -On fentanyl 37.5 mcg and hydrocodone at baseline, resumed   DVT prophylaxis: Eliquis Code Status: DNR Family Communication: Discussed patient in detail, daughter at bedside yesterday Disposition Plan: Back to SNF when stable pending GI work-up, likely Thursday  Consultants: Gastroenterology   Procedures:   Antimicrobials:    Objective: Vitals:   09/08/21 1636 09/08/21 2049 09/09/21 0600 09/09/21 0953  BP: (!) 159/71 (!) 174/60 (!) 166/74 (!) 162/76  Pulse: 80 69 66 68  Resp: _0 Temp: 98.7 F (37.1 C) 98.3 F (36.8 C) 97.8 F (36.6 C) 98 F (36.7 C)  TempSrc: Oral Oral Oral Oral  SpO2: 95% 94% 94% 96%  Weight:      Height:        Intake/Output Summary (Last 24 hours) at  09/09/2021 1402 Last data filed at 09/09/2021 0900 Gross per 24 hour  Intake 1160 ml  Output 1220 ml  Net -60 ml   Filed Weights   09/04/21 1424  Weight: 82.7 kg    Examination:  General exam: Pleasant chronically ill female sitting up in bed, AAOx3, no distress HEENT: No JVD CVS: S1-S2, regular rhythm Lungs: Diminished breath sounds the bases Abdomen: Soft, nontender, bowel sounds present Extremities: Trace edema  Skin: No rashes on exposed skin Psychiatry:  Mood & affect appropriate.     Data Reviewed:   CBC: Recent Labs  Lab 09/03/21 1303 09/04/21 0341 09/06/21 0208 09/07/21 0449 09/07/21 1548 09/08/21 0243 09/09/21 0430  WBC 12.2*   < > 16.2* 10.8* 9.3 10.2 13.0*  NEUTROABS 11.5*  --   --   --   --   --   --   HGB 11.7*   < > 8.9* 9.2* 10.1* 9.7* 10.2*  HCT 38.7   < > 29.1* 28.7* 32.2* 31.0* 32.4*  MCV 97.5   < > 93.6 92.6 92.8 93.1 91.3  PLT 203   < > 235 274 285 277 324   < > = values in this interval not displayed.   Basic Metabolic Panel: Recent Labs  Lab 09/05/21 0325 09/06/21 0208 09/07/21 0449 09/08/21 0243 09/09/21 0430  NA 139 136 141 141 138  K 3.7 4.7 4.0 3.6 3.3*  CL 104 103 106 102 98  CO2 _0 GLUCOSE 120* 147* 91 103* 84  BUN 26* 29* 25* 21 18  CREATININE 1.45* 1.56* 1.34* 1.42* 1.14*  CALCIUM 7.4* 7.6* 8.0* 8.2* 8.7*   GFR: Estimated Creatinine Clearance: 37.9 mL/min (A) (by C-G formula based on SCr of 1.14 mg/dL (H)). Liver Function Tests: Recent Labs  Lab 09/04/21 0341 09/05/21 0325  AST 31 22  ALT 56* 41  ALKPHOS 159* 125  BILITOT 0.9 0.4  PROT 5.3* 4.8*  ALBUMIN 2.5* 2.2*   Recent Labs  Lab 09/04/21 0341  LIPASE 30   No results for input(s): "AMMONIA" in the last 168 hours. Coagulation Profile: Recent Labs  Lab 09/03/21 1843  INR 1.7*   Cardiac Enzymes: No results for input(s): "CKTOTAL", "CKMB", "CKMBINDEX", "TROPONINI" in the last 168 hours. BNP (last 3 results) No results for input(s):  "PROBNP" in the last 8760 hours. HbA1C: No results for input(s): "HGBA1C" in the last 72 hours. CBG: No results for input(s): "GLUCAP" in the last 168 hours. Lipid Profile: No results for input(s): "CHOL", "HDL", "LDLCALC", "TRIG", "CHOLHDL", "LDLDIRECT" in the last 72 hours. Thyroid Function Tests: No results for input(s): "TSH", "T4TOTAL", "FREET4", "T3FREE", "THYROIDAB" in the last 72 hours.  Anemia Panel: No results for input(s): "VITAMINB12", "FOLATE", "FERRITIN", "TIBC", "IRON", "RETICCTPCT" in the last 72 hours. Urine analysis:    Component Value Date/Time   COLORURINE AMBER (A) 09/03/2021 1821   APPEARANCEUR CLOUDY (A) 09/03/2021 1821   LABSPEC 1.034 (H) 09/03/2021 1821   PHURINE 5.0 09/03/2021 1821   GLUCOSEU NEGATIVE 09/03/2021 1821   GLUCOSEU NEGATIVE 07/31/2016 1018   HGBUR SMALL (A) 09/03/2021 1821   BILIRUBINUR NEGATIVE 09/03/2021 1821   BILIRUBINUR neg 07/31/2016 1012   KETONESUR NEGATIVE 09/03/2021 1821   PROTEINUR 30 (A) 09/03/2021 1821   UROBILINOGEN 0.2 07/31/2016 1018   UROBILINOGEN 0.2 07/31/2016 1012   NITRITE NEGATIVE 09/03/2021  [place] (A) 09/03/2021 1821   Sepsis Labs: _0 (procalcitonin:4,lacticidven:4)  ) Recent Results (from the past 240 hour(s))  Blood Culture (routine x 2)     Status: None   Collection Time: 09/03/21  6:30 PM   Specimen: BLOOD  Result Value Ref Range Status   Specimen Description BLOOD SITE NOT SPECIFIED  Final   Special Requests   Final    BOTTLES DRAWN AEROBIC AND ANAEROBIC Blood Culture adequate volume   Culture   Final    NO GROWTH 5 DAYS Performed at [facility], [place] [address] [place], [place] [address]    Report Status 09/08/2021 FINAL  Final  Blood Culture (routine x 2)     Status: None   Collection Time: 09/03/21  6:43 PM   Specimen: BLOOD RIGHT HAND  Result Value Ref Range Status   Specimen Description BLOOD RIGHT HAND  Final   Special Requests   Final    BOTTLES DRAWN  AEROBIC AND ANAEROBIC Blood Culture results may not be optimal due to an inadequate volume of blood received in culture bottles   Culture   Final    NO GROWTH 5 DAYS Performed at [facility], [place] [address], [place], [place] [address]    Report Status 09/08/2021 FINAL  Final  Resp Panel by RT-PCR (Flu A&B, Covid) Anterior Nasal Swab     Status: None   Collection Time: 09/03/21  6:59 PM   Specimen: Anterior Nasal Swab  Result Value Ref Range Status   SARS Coronavirus 2 by RT PCR NEGATIVE NEGATIVE Final    Comment: (NOTE) SARS-CoV-2 target nucleic acids are NOT DETECTED.  The SARS-CoV-2 RNA is generally detectable in upper respiratory specimens during the acute phase of infection. The lowest concentration of SARS-CoV-2 viral copies this assay can detect is 138 copies/mL. A negative result does not preclude SARS-Cov-2 infection and should not be used as the sole basis for treatment or other patient management decisions. A negative result may occur with  improper specimen collection/handling, submission of specimen other than nasopharyngeal swab, presence of viral mutation(s) within the areas targeted by this assay, and inadequate number of viral copies(<138 copies/mL). A negative result must be combined with clinical observations, patient history, and epidemiological information. The expected result is Negative.  Fact Sheet for Patients:  EntrepreneurPulse.com.au  Fact Sheet for Healthcare Providers:  IncredibleEmployment.be  This test is no t yet approved or cleared by the [place] FDA and  has been authorized for detection and/or diagnosis of SARS-CoV-2 by FDA under an Emergency Use Authorization (EUA). This EUA will remain  in effect (meaning this test can be used) for the duration of the COVID-19 declaration under Section 564(b)(1) of the Act, 21 U.S.C.section 360bbb-3(b)(1), unless the authorization is terminated  or revoked  sooner.       Influenza A by PCR NEGATIVE NEGATIVE Final   Influenza B by PCR NEGATIVE NEGATIVE Final    Comment: (NOTE) The Xpert Xpress SARS-CoV-2/FLU/RSV plus assay is intended as an aid in the diagnosis of influenza from Nasopharyngeal swab specimens and should not be used as a sole basis for treatment. Nasal washings and aspirates are unacceptable for Xpert Xpress SARS-CoV-2/FLU/RSV testing.  Fact Sheet for Patients: EntrepreneurPulse.com.au  Fact Sheet for Healthcare Providers: IncredibleEmployment.be  This test is not yet approved or cleared by the [place] FDA and has been authorized for detection and/or diagnosis of SARS-CoV-2 by FDA under an Emergency Use Authorization (EUA). This EUA will  remain in effect (meaning this test can be used) for the duration of the COVID-19 declaration under Section 564(b)(1) of the Act, 21 U.S.C. section 360bbb-3(b)(1), unless the authorization is terminated or revoked.  Performed at [facility], [place] [address] [place], [place] [address]   Urine Culture     Status: Abnormal   Collection Time: 09/03/21  7:05 PM   Specimen: In/Out Cath Urine  Result Value Ref Range Status   Specimen Description IN/OUT CATH URINE  Final   Special Requests   Final    NONE Performed at [facility], [place] [address], [place], [place] [address]    Culture >=100,000 COLONIES/mL ESCHERICHIA COLI (A)  Final   Report Status 09/06/2021 FINAL  Final   Organism ID, Bacteria ESCHERICHIA COLI (A)  Final      Susceptibility   Escherichia coli - MIC*    AMPICILLIN >=32 RESISTANT Resistant     CEFAZOLIN 8 SENSITIVE Sensitive     CEFEPIME <=0.12 SENSITIVE Sensitive     CEFTRIAXONE <=0.25 SENSITIVE Sensitive     CIPROFLOXACIN <=0.25 SENSITIVE Sensitive     GENTAMICIN <=1 SENSITIVE Sensitive     IMIPENEM <=0.25 SENSITIVE Sensitive     NITROFURANTOIN <=16 SENSITIVE Sensitive     TRIMETH/SULFA <=20 SENSITIVE  Sensitive     AMPICILLIN/SULBACTAM 16 INTERMEDIATE Intermediate     PIP/TAZO <=4 SENSITIVE Sensitive     * >=100,000 COLONIES/mL ESCHERICHIA COLI     Radiology Studies: No results found.   Scheduled Meds:  allopurinol  100 mg Oral Daily   amiodarone  200 mg Oral Daily   atorvastatin  80 mg Oral QHS   cephALEXin  250 mg Oral QID   diltiazem  180 mg Oral Daily   escitalopram  10 mg Oral Daily   fentaNYL  1 patch Transdermal Q72H   fentaNYL  1 patch Transdermal Q72H   furosemide  40 mg Oral Daily   metoCLOPramide (REGLAN) injection  10 mg Intravenous Once   metoCLOPramide (REGLAN) injection  10 mg Intravenous Once   pantoprazole  40 mg Oral BID   peg 3350 powder  0.5 kit Oral Once   peg 3350 powder  0.5 kit Oral Once   predniSONE  10 mg Oral Q breakfast   QUEtiapine  25 mg Oral QHS   Continuous Infusions:     LOS: 6 days    Time spent: 93mn  PDomenic Polite MD [facility]   09/09/2021, 2:02 PM

## 2021-09-10 ENCOUNTER — Encounter (HOSPITAL_COMMUNITY)
Admission: EM | Disposition: A | Discharge status: Skilled Nursing Facility | Payer: Self-pay | Source: Home / Self Care | Attending: Internal Medicine

## 2021-09-10 ENCOUNTER — Inpatient Hospital Stay (HOSPITAL_COMMUNITY): Payer: Medicare Other | Admitting: Certified Registered Nurse Anesthetist

## 2021-09-10 ENCOUNTER — Encounter (HOSPITAL_COMMUNITY): Payer: Self-pay | Admitting: Internal Medicine

## 2021-09-10 DIAGNOSIS — K635 Polyp of colon: Secondary | ICD-10-CM

## 2021-09-10 DIAGNOSIS — K648 Other hemorrhoids: Secondary | ICD-10-CM

## 2021-09-10 DIAGNOSIS — A419 Sepsis, unspecified organism: Secondary | ICD-10-CM | POA: Diagnosis not present

## 2021-09-10 DIAGNOSIS — K5731 Diverticulosis of large intestine without perforation or abscess with bleeding: Secondary | ICD-10-CM

## 2021-09-10 DIAGNOSIS — D124 Benign neoplasm of descending colon: Secondary | ICD-10-CM

## 2021-09-10 DIAGNOSIS — K3189 Other diseases of stomach and duodenum: Secondary | ICD-10-CM

## 2021-09-10 DIAGNOSIS — N390 Urinary tract infection, site not specified: Secondary | ICD-10-CM | POA: Diagnosis not present

## 2021-09-10 DIAGNOSIS — D123 Benign neoplasm of transverse colon: Secondary | ICD-10-CM

## 2021-09-10 DIAGNOSIS — K649 Unspecified hemorrhoids: Secondary | ICD-10-CM

## 2021-09-10 DIAGNOSIS — K626 Ulcer of anus and rectum: Secondary | ICD-10-CM

## 2021-09-10 HISTORY — PX: POLYPECTOMY: SHX5525

## 2021-09-10 HISTORY — PX: COLONOSCOPY WITH PROPOFOL: SHX5780

## 2021-09-10 HISTORY — PX: ESOPHAGOGASTRODUODENOSCOPY (EGD) WITH PROPOFOL: SHX5813

## 2021-09-10 LAB — CBC
HCT: 31.4 % — ABNORMAL LOW (ref 36.0–46.0)
Hemoglobin: 10.1 g/dL — ABNORMAL LOW (ref 12.0–15.0)
MCH: 29.4 pg (ref 26.0–34.0)
MCHC: 32.2 g/dL (ref 30.0–36.0)
MCV: 91.5 fL (ref 80.0–100.0)
Platelets: 301 10*3/uL (ref 150–400)
RBC: 3.43 MIL/uL — ABNORMAL LOW (ref 3.87–5.11)
RDW: 14.6 % (ref 11.5–15.5)
WBC: 12.9 10*3/uL — ABNORMAL HIGH (ref 4.0–10.5)
nRBC: 0.3 % — ABNORMAL HIGH (ref 0.0–0.2)

## 2021-09-10 LAB — BASIC METABOLIC PANEL
Anion gap: 11 (ref 5–15)
BUN: 12 mg/dL (ref 8–23)
CO2: 31 mmol/L (ref 22–32)
Calcium: 8.5 mg/dL — ABNORMAL LOW (ref 8.9–10.3)
Chloride: 102 mmol/L (ref 98–111)
Creatinine, Ser: 1.04 mg/dL — ABNORMAL HIGH (ref 0.44–1.00)
GFR, Estimated: 53 mL/min — ABNORMAL LOW (ref >60)
Glucose, Bld: 89 mg/dL (ref 70–99)
Potassium: 3 mmol/L — ABNORMAL LOW (ref 3.5–5.1)
Sodium: 144 mmol/L (ref 135–145)

## 2021-09-10 SURGERY — ESOPHAGOGASTRODUODENOSCOPY (EGD) WITH PROPOFOL
Anesthesia: General

## 2021-09-10 MED ORDER — FENTANYL CITRATE (PF) 250 MCG/5ML IJ SOLN
INTRAMUSCULAR | Status: DC | PRN
Start: 2021-09-10 — End: 2021-09-10
  Administered 2021-09-10: 25 ug via INTRAVENOUS
  Administered 2021-09-10: 50 ug via INTRAVENOUS

## 2021-09-10 MED ORDER — POTASSIUM CHLORIDE 10 MEQ/100ML IV SOLN
10.0000 meq | INTRAVENOUS | Status: AC
  Administered 2021-09-10: 10 meq via INTRAVENOUS
  Filled 2021-09-10 (×2): qty 100

## 2021-09-10 MED ORDER — SUGAMMADEX SODIUM 200 MG/2ML IV SOLN
INTRAVENOUS | Status: DC | PRN
  Administered 2021-09-10: 200 mg via INTRAVENOUS

## 2021-09-10 MED ORDER — SODIUM CHLORIDE 0.9 % IV SOLN: INTRAVENOUS | Status: DC

## 2021-09-10 MED ORDER — PHENYLEPHRINE HCL (PRESSORS) 10 MG/ML IV SOLN
INTRAVENOUS | Status: DC | PRN
  Administered 2021-09-10: 120 ug via INTRAVENOUS

## 2021-09-10 MED ORDER — ROCURONIUM BROMIDE 10 MG/ML (PF) SYRINGE
PREFILLED_SYRINGE | INTRAVENOUS | Status: DC | PRN
  Administered 2021-09-10: 40 mg via INTRAVENOUS

## 2021-09-10 MED ORDER — POTASSIUM CHLORIDE CRYS ER 20 MEQ PO TBCR
20.0000 meq | EXTENDED_RELEASE_TABLET | Freq: Two times a day (BID) | ORAL | Status: DC
  Administered 2021-09-10: 20 meq via ORAL
  Filled 2021-09-10: qty 1

## 2021-09-10 MED ORDER — PROPOFOL 10 MG/ML IV BOLUS
INTRAVENOUS | Status: DC | PRN
  Administered 2021-09-10: 80 mg via INTRAVENOUS
  Administered 2021-09-10: 30 mg via INTRAVENOUS

## 2021-09-10 MED ORDER — LACTATED RINGERS IV SOLN: INTRAVENOUS | Status: DC | PRN

## 2021-09-10 MED ORDER — ONDANSETRON HCL 4 MG/2ML IJ SOLN
INTRAMUSCULAR | Status: DC | PRN
  Administered 2021-09-10: 4 mg via INTRAVENOUS

## 2021-09-10 SURGICAL SUPPLY — 25 items

## 2021-09-10 NOTE — Anesthesia Postprocedure Evaluation (Signed)
Anesthesia Post Note  Patient: Betty Jackson  Procedure(s) Performed: ESOPHAGOGASTRODUODENOSCOPY (EGD) WITH PROPOFOL COLONOSCOPY WITH PROPOFOL POLYPECTOMY     Patient location during evaluation: PACU Anesthesia Type: General Level of consciousness: awake Pain management: pain level controlled Vital Signs Assessment: post-procedure vital signs reviewed and stable Respiratory status: spontaneous breathing Cardiovascular status: stable Postop Assessment: no apparent nausea or vomiting Anesthetic complications: no   No notable events documented.  Last Vitals:  Vitals:   09/10/21 1515 09/10/21 1525  BP: (!) 152/50 (!) 152/47  Pulse:  78  Resp:  17  Temp:    SpO2:  96%    Last Pain:  Vitals:   09/10/21 1525  TempSrc:   PainSc: 0-No pain                 Nathali Vent

## 2021-09-10 NOTE — Op Note (Signed)
[facility] Patient Name: Betty Jackson Procedure Date : 09/10/2021 MRN: [id number] Attending MD: Gatha Mayer , MD Date of Birth: [birth year] CSN: [id number] Age: 87 Admit Type: Inpatient Procedure:                Colonoscopy Indications:              Hematochezia Providers:                Gatha Mayer, MD, Dulcy Fanny, Gloris Ham, Technician Referring MD:              Medicines:                General Anesthesia Complications:            No immediate complications. Estimated Blood Loss:     Estimated blood loss was minimal. Procedure:                Pre-Anesthesia Assessment:                           - Prior to the procedure, a History and Physical                            was performed, and patient medications and                            allergies were reviewed. The patient's tolerance of                            previous anesthesia was also reviewed. The risks                            and benefits of the procedure and the sedation                            options and risks were discussed with the patient.                            All questions were answered, and informed consent                            was obtained. Prior Anticoagulants: The patient                            last took Eliquis (apixaban) 7 days prior to the                            procedure. ASA Grade Assessment: III - A patient                            with severe systemic disease. After reviewing the                            risks  and benefits, the patient was deemed in                            satisfactory condition to undergo the procedure.                           After obtaining informed consent, the colonoscope                            was passed under direct vision. Throughout the                            procedure, the patient's blood pressure, pulse, and                            oxygen saturations were monitored  continuously. The                            CF-HQ190L ([id number]) Olympus coloscope was                            introduced through the anus and advanced to the the                            cecum, identified by appendiceal orifice and                            ileocecal valve. The colonoscopy was performed                            without difficulty. The patient tolerated the                            procedure well. The quality of the bowel                            preparation was good. The bowel preparation used                            was MoviPrep via split dose instruction. The                            ileocecal valve, appendiceal orifice, and rectum                            were photographed. Scope In: 2:24:14 PM Scope Out: 2:37:20 PM Scope Withdrawal Time: 0 hours 9 minutes 16 seconds  Total Procedure Duration: 0 hours 13 minutes 6 seconds  Findings:      Hemorrhoids were found on perianal exam.      External and internal hemorrhoids were found.      A single (solitary) small, soft mm ulcer was found in the distal rectum.       No bleeding was present. Stigmata of recent bleeding were present.      Two sessile polyps were found  in the descending colon and transverse       colon. The polyps were 6 to 8 mm in size. These polyps were removed with       a cold snare. Resection and retrieval were complete. Verification of       patient identification for the specimen was done. Estimated blood loss       was minimal.      Multiple diverticula were found in the sigmoid colon and descending       colon.      The exam was otherwise without abnormality on direct and retroflexion       views. Impression:               - Hemorrhoids found on perianal exam.                           - External and internal hemorrhoids.                           - A single (solitary) ulcer in the distal rectum.                           - Two 6 to 8 mm polyps in the descending colon and                             in the transverse colon, removed with a cold snare.                            Resected and retrieved.                           - Diverticulosis in the sigmoid colon and in the                            descending colon.                           - The examination was otherwise normal on direct                            and retroflexion views. Recommendation:           - Return patient to hospital ward for ongoing care.                           - Add daily Miralax 17 g - this should keep stools                            softer and treat hemorrhoids and rectal ulcer. Must                            have bled from these. Suspect a small stercoral                            ulcer. It does not need follow-up unless more  problems.                           I will contact about polyp pathology but they look                            benign and she will not need a surveillance                            colonoscopy.                           Restart Eliquis tomorrow. Would not be surprised if                            there is some rectal bleeding. Can use                            hydrocortsione cream prn if needed.                           GI signing off Procedure Code(s):        --- Professional ---                           [id number], Colonoscopy, flexible; with removal of                            tumor(s), polyp(s), or other lesion(s) by snare                            technique Diagnosis Code(s):        --- Professional ---                           K64.8, Other hemorrhoids                           K62.6, Ulcer of anus and rectum                           K63.5, Polyp of colon                           K92.1, Melena (includes Hematochezia)                           K57.30, Diverticulosis of large intestine without                            perforation or abscess without bleeding CPT copyright 2019 American Medical Association. All  rights reserved. The codes documented in this report are preliminary and upon coder review may  be revised to meet current compliance requirements. Gatha Mayer, MD 09/10/2021 2:56:38 PM This report has been signed electronically. Number of Addenda: 0

## 2021-09-10 NOTE — Interval H&P Note (Signed)
History and Physical Interval Note:  09/10/2021 1:43 PM  Betty Jackson  has presented today for surgery, with the diagnosis of hematochezia, odynophagia.  The various methods of treatment have been discussed with the patient and family. After consideration of risks, benefits and other options for treatment, the patient has consented to  Procedure(s): ESOPHAGOGASTRODUODENOSCOPY (EGD) WITH PROPOFOL (N/A) COLONOSCOPY WITH PROPOFOL (N/A) as a surgical intervention.  The patient's history has been reviewed, patient examined, no change in status, stable for surgery.  I have reviewed the patient's chart and labs.  Questions were answered to the patient's satisfaction.     Silvano Rusk

## 2021-09-10 NOTE — Progress Notes (Signed)
OT Cancellation Note  Patient Details Name: Betty Jackson MRN: [id number] DOB: 1935-02-12   Cancelled Treatment:    Reason Eval/Treat Not Completed: Other (comment) Pt scheduled for procedure at 10:30 per family, pt/family wanting to hold off on therapy until procedure is completed.  Will check back later if time permits or see tomorrow if not able to come back today.   Karrah Mangini OTR/L 09/10/2021, 11:14 AM

## 2021-09-10 NOTE — Transfer of Care (Signed)
Immediate Anesthesia Transfer of Care Note  Patient: Betty Jackson  Procedure(s) Performed: ESOPHAGOGASTRODUODENOSCOPY (EGD) WITH PROPOFOL COLONOSCOPY WITH PROPOFOL POLYPECTOMY  Patient Location: Endoscopy Unit  Anesthesia Type:General  Level of Consciousness: awake, alert , patient cooperative and responds to stimulation  Airway & Oxygen Therapy: Patient Spontanous Breathing and Patient connected to face mask oxygen  Post-op Assessment: Report given to RN and Post -op Vital signs reviewed and stable  Post vital signs: Reviewed and stable  Last Vitals:  Vitals Value Taken Time  BP    Temp    Pulse 88 09/10/21 1457  Resp 15 09/10/21 1457  SpO2 94 % 09/10/21 1457  Vitals shown include unvalidated device data.  Last Pain:  Vitals:   09/10/21 1225  TempSrc: Temporal  PainSc: 0-No pain         Complications: No notable events documented.

## 2021-09-10 NOTE — Op Note (Signed)
[facility] Patient Name: Betty Jackson Procedure Date : 09/10/2021 MRN: [id number] Attending MD: Gatha Mayer , MD Date of Birth: May 02, 1934 CSN: [id number] Age: 87 Admit Type: Inpatient Procedure:                Upper GI endoscopy Indications:              Odynophagia Providers:                Gatha Mayer, MD, Dulcy Fanny, Gloris Ham, Technician Referring MD:              Medicines:                General Anesthesia Complications:            No immediate complications. Estimated Blood Loss:     Estimated blood loss: none. Procedure:                Pre-Anesthesia Assessment:                           - Prior to the procedure, a History and Physical                            was performed, and patient medications and                            allergies were reviewed. The patient's tolerance of                            previous anesthesia was also reviewed. The risks                            and benefits of the procedure and the sedation                            options and risks were discussed with the patient.                            All questions were answered, and informed consent                            was obtained. Prior Anticoagulants: The patient                            last took Eliquis (apixaban) 7 days prior to the                            procedure. ASA Grade Assessment: III - A patient                            with severe systemic disease. After reviewing the  risks and benefits, the patient was deemed in                            satisfactory condition to undergo the procedure.                           After obtaining informed consent, the endoscope was                            passed under direct vision. Throughout the                            procedure, the patient's blood pressure, pulse, and                            oxygen saturations were monitored  continuously. The                            [id number]-H190 ([id number]) Olympus endoscope was introduced                            through the mouth, and advanced to the second part                            of duodenum. The upper GI endoscopy was                            accomplished without difficulty. The patient                            tolerated the procedure well. Scope In: Scope Out: Findings:      The examined esophagus was normal.      Patchy mildly erythematous mucosa was found in the gastric antrum.      The exam was otherwise without abnormality.      The cardia and gastric fundus were normal on retroflexion. Impression:               - Normal esophagus. PRIOR SEVERE ESOPHAGITIS                            RESOLVED                           - Erythematous mucosa in the antrum.                           - The examination was otherwise normal.                           - No specimens collected. Recommendation:           - See the other procedure note for documentation of                            additional recommendations.                           -  Stay off alendronate and similar medications                           - will cancel July outpatient EGD w/ Dr. Loletha Carrow                           See colonoscopy report                           - daily PPI Procedure Code(s):        --- Professional ---                           [phone], Esophagogastroduodenoscopy, flexible,                            transoral; diagnostic, including collection of                            specimen(s) by brushing or washing, when performed                            (separate procedure) Diagnosis Code(s):        --- Professional ---                           K31.89, Other diseases of stomach and duodenum                           R13.10, Dysphagia, unspecified CPT copyright 2019 American Medical Association. All rights reserved. The codes documented in this report are preliminary and upon coder  review may  be revised to meet current compliance requirements. Gatha Mayer, MD 09/10/2021 2:50:00 PM This report has been signed electronically. Number of Addenda: 0

## 2021-09-10 NOTE — Anesthesia Preprocedure Evaluation (Addendum)
Anesthesia Evaluation  Patient identified by MRN, date of birth, ID band Patient awake    Reviewed: Allergy & Precautions, NPO status , Patient's Chart, lab work & pertinent test results  Airway Mallampati: II  TM Distance: >3 FB     Dental   Pulmonary shortness of breath and with exertion, pneumonia,    breath sounds clear to auscultation       Cardiovascular hypertension, Pt. on medications + CAD and +CHF   Rhythm:Regular Rate:Normal     Neuro/Psych PSYCHIATRIC DISORDERS Anxiety Depression CVA    GI/Hepatic Neg liver ROS, GERD  ,  Endo/Other    Renal/GU Renal diseaseCr 1.04  negative genitourinary   Musculoskeletal  (+) Arthritis ,   Abdominal   Peds  Hematology Hb 10.1   Anesthesia Other Findings   Reproductive/Obstetrics negative OB ROS                            Anesthesia Physical Anesthesia Plan  ASA: 3  Anesthesia Plan: General   Post-op Pain Management:    Induction: Intravenous  PONV Risk Score and Plan: 3 and Ondansetron  Airway Management Planned: Oral ETT  Additional Equipment:   Intra-op Plan:   Post-operative Plan:   Informed Consent: I have reviewed the patients History and Physical, chart, labs and discussed the procedure including the risks, benefits and alternatives for the proposed anesthesia with the patient or authorized representative who has indicated his/her understanding and acceptance.     Dental advisory given  Plan Discussed with: CRNA and Anesthesiologist  Anesthesia Plan Comments:         Anesthesia Quick Evaluation

## 2021-09-10 NOTE — Anesthesia Procedure Notes (Signed)
Procedure Name: Intubation Date/Time: 09/10/2021 2:07 PM  Performed by: Glynda Jaeger, CRNAPre-anesthesia Checklist: Patient identified, Patient being monitored, Timeout performed, Emergency Drugs available and Suction available Patient Re-evaluated:Patient Re-evaluated prior to induction Oxygen Delivery Method: Circle System Utilized Preoxygenation: Pre-oxygenation with 100% oxygen Induction Type: IV induction Ventilation: Mask ventilation without difficulty Laryngoscope Size: Mac and 3 Grade View: Grade I Tube type: Oral Tube size: 7.5 mm Number of attempts: 1 Airway Equipment and Method: Stylet Placement Confirmation: ETT inserted through vocal cords under direct vision, positive ETCO2 and breath sounds checked- equal and bilateral Secured at: 21 cm Tube secured with: Tape Dental Injury: Teeth and Oropharynx as per pre-operative assessment

## 2021-09-10 NOTE — Progress Notes (Signed)
PROGRESS NOTE    Betty HOLLINGWORTH   YDX:[id number]  DOB: 1934-09-19   DOA: 09/03/2021  PCP: Alvester Morin, MD    Subjective: -Feels okay, denies any complaints, brown stool with scant blood today  Betty Jackson is a 87 y.o. female from SNF with medical history significant of CAD, hypertension, hyperlipidemia, PAF on Eliquis, CKD stage IIIa, CAD status post DES to RCA in 2005, HFpEF, CVA, rheumatoid arthritis/giant cell arteritis on chronic prednisone, anxiety/depression, chronic insomnia, chronic low back pain, GERD, osteoporosis, chronic hypoxic respiratory failure on baseline 2 L oxygen.  Recently admitted 5/9 for dysphagia and severe esophagitis with bleeding secondary to alendronate which was discontinued, also developed A-fib RVR and aspiration pneumonia . Seen in cardiology clinic 1 day PTA, dose of Lasix was increased to 80 mg daily x2 days due to lower extremity edema. -Presented to the ED 6/7 with weakness, low-grade fevers and malaise -Admitted with sepsis, E. coli UTI, improving on antibiotics -6/11 evening with hematochezia, blood clots in stool  -Plan for EGD/ colonoscopy 6/14 -hemorrhoids, small soft ulcer distal rectum, bleeding, 2 polyp removed from descending colon, multiple diverticular findings in the sigmoid colon and descending colon EGD: Normal esophagus, patchy erythema in the gastric antrum otherwise normal study   Assessment and Plan:  Sepsis secondary to UTI -Improved sepsis physiology -met criteria for sepsis with fever, tachycardia, and leukocytosis -was on antibiotics of IV ceftriaxone, urine cultures with E. Coli-pansensitive, blood cultures negative, transitioned to oral Keflex to complete 5-day course  -Off stress dose steroids, back to home regimen of prednisone  -Discharge to SNF when stable next 24 hours  Hematochezia with clots -Multiple episodes this weekend with hematochezia and clots -Eliquis held 6/11 PM, hemoglobin is stable, recent  esophagitis, clinically do not suspect upper GI bleed, BUN is stable as well -Followed by [facility], appreciate GI input, plan for EGD and colonoscopy today 09/10/2021  -Plan for EGD/ colonoscopy 6/14 -hemorrhoids, small soft ulcer distal rectum, bleeding, 2 polyp removed from descending colon, multiple diverticular findings in the sigmoid colon and descending colon EGD: Normal esophagus, patchy erythema in the gastric antrum otherwise normal study    AKI on CKD IIIa -Secondary to sepsis -Improving, hold losartan, Lasix resumed   Recent severe esophagitis -Secondary to bisphosphonates -Continue PPI given severe esophagitis and gastritis on recent EGD, plan for repeat EGD 7/17 -Per EGD today 09/10/2021 severe esophagitis has improved   Acute on chronic diastolic CHF  -Appears mildly volume overloaded secondary to fluid resuscitation for sepsis yesterday, echo done in January 2021 showing LVEF 60 to 72%, grade 2 diastolic dysfunction. -Continue Lasix, now p.o., hold losartan  Chronic anemia,  -Hemoglobin stable at baseline 9.7, 10.2, 10.1,   CAD Stable, continue statin   PAF Stable, currently in sinus rhythm. -Continue Amiodarone, and Diltiazem.  Holding Eliquis    Tooth pain Patient had her lower incisors extracted 4 months ago. No gum swelling or obvious signs of oral infection at this time. CT soft tissue neck with contrast showing no significant inflammatory changes or abscess. -Outpatient follow-up with her oral surgeon recommended   Rheumatoid arthritis -Back on on prednisone 10 Mg daily at home dose, stress dose steroids discontinued    Anxiety/ depression Chronic insomnia -Continue Lexapro and Seroquel   Chronic hypoxic respiratory failure on 2L O2 No change in O2 requirement from baseline.  Remains on 2 L of oxygen, satting 91% -Continue supplemental O2   Multiple lung nodules -Could be secondary to rheumatoid arthritis, needs  follow-up   Gout -Continue  allopurinol  Hypokalemia  -Potassium today 3.0, repleting with 4 X 10 meq IV rounds as patient was n.p.o. in a.m.   chronic pain -On fentanyl 37.5 mcg and hydrocodone at baseline, resumed   DVT prophylaxis: Eliquis Code Status: DNR Family Communication: Discussed patient in detail, daughter at bedside yesterday Disposition Plan: Back to SNF when stable pending GI work-up, likely Thursday  Consultants: Gastroenterology   Procedures:   Antimicrobials:    Objective: Vitals:   09/10/21 1019 09/10/21 1225 09/10/21 1457 09/10/21 1505  BP: (!) 177/72 (!) 181/59 (!) 183/46 (!) 164/57  Pulse: 67 63 89 85  Resp: '17 13 12 14  ' Temp: 98.7 F (37.1 C) 98 F (36.7 C) (!) 97.4 F (36.3 C)   TempSrc:  Temporal Temporal   SpO2: 93% 93% 94% 91%  Weight:      Height:        Intake/Output Summary (Last 24 hours) at 09/10/2021 1509 Last data filed at 09/10/2021 1457 Gross per 24 hour  Intake 860 ml  Output --  Net 860 ml   Filed Weights   09/04/21 1424  Weight: 82.7 kg     Physical Exam:   General:  AAO x 3,  cooperative, no distress;   HEENT:  Normocephalic, PERRL, otherwise with in Normal limits   Neuro:  CNII-XII intact. , normal motor and sensation, reflexes intact   Lungs:   Clear to auscultation BL, Respirations unlabored,  No wheezes / crackles  Cardio:    S1/S2, RRR, No murmure, No Rubs or Gallops   Abdomen:  Soft, non-tender, bowel sounds active all four quadrants, no guarding or peritoneal signs.  Muscular  skeletal:  Limited exam -global generalized weaknesses - in bed, able to move all 4 extremities,   2+ pulses,  symmetric, No pitting edema  Skin:  Dry, warm to touch, negative for any Rashes,  Wounds: Please see nursing documentation  Pressure Injury 10/17/19 Heel Right Stage 2 -  Partial thickness loss of dermis presenting as a shallow open injury with a red, pink wound bed without slough. (Active)  10/17/19 2100  Location: Heel  Location Orientation:  Right  Staging: Stage 2 -  Partial thickness loss of dermis presenting as a shallow open injury with a red, pink wound bed without slough.  Wound Description (Comments):   Present on Admission: Yes         Data Reviewed:   CBC: Recent Labs  Lab 09/07/21 0449 09/07/21 1548 09/08/21 0243 09/09/21 0430 09/10/21 0224  WBC 10.8* 9.3 10.2 13.0* 12.9*  HGB 9.2* 10.1* 9.7* 10.2* 10.1*  HCT 28.7* 32.2* 31.0* 32.4* 31.4*  MCV 92.6 92.8 93.1 91.3 91.5  PLT 274 285 277 324 353   Basic Metabolic Panel: Recent Labs  Lab 09/06/21 0208 09/07/21 0449 09/08/21 0243 09/09/21 0430 09/10/21 0224  NA 136 141 141 138 144  K 4.7 4.0 3.6 3.3* 3.0*  CL 103 106 102 98 102  CO2 '22 27 30 31 31  ' GLUCOSE 147* 91 103* 84 89  BUN 29* 25* '21 18 12  ' CREATININE 1.56* 1.34* 1.42* 1.14* 1.04*  CALCIUM 7.6* 8.0* 8.2* 8.7* 8.5*   GFR: Estimated Creatinine Clearance: 41.6 mL/min (A) (by C-G formula based on SCr of 1.04 mg/dL (H)). Liver Function Tests: Recent Labs  Lab 09/04/21 0341 09/05/21 0325  AST 31 22  ALT 56* 41  ALKPHOS 159* 125  BILITOT 0.9 0.4  PROT 5.3* 4.8*  ALBUMIN 2.5* 2.2*  Recent Labs  Lab 09/04/21 0341  LIPASE 30   No results for input(s): "AMMONIA" in the last 168 hours. Coagulation Profile: Recent Labs  Lab 09/03/21 1843  INR 1.7*    Urine analysis:    Component Value Date/Time   COLORURINE AMBER (A) 09/03/2021 1821   APPEARANCEUR CLOUDY (A) 09/03/2021 1821   LABSPEC 1.034 (H) 09/03/2021 1821   PHURINE 5.0 09/03/2021 1821   GLUCOSEU NEGATIVE 09/03/2021 1821   GLUCOSEU NEGATIVE 07/31/2016 1018   HGBUR SMALL (A) 09/03/2021 1821   BILIRUBINUR NEGATIVE 09/03/2021 1821   BILIRUBINUR neg 07/31/2016 1012   KETONESUR NEGATIVE 09/03/2021 1821   PROTEINUR 30 (A) 09/03/2021 1821   UROBILINOGEN 0.2 07/31/2016 1018   UROBILINOGEN 0.2 07/31/2016 1012   NITRITE NEGATIVE 09/03/2021 1821   LEUKOCYTESUR LARGE (A) 09/03/2021 1821   Sepsis  Labs: '@LABRCNTIP' (procalcitonin:4,lacticidven:4)  ) Recent Results (from the past 240 hour(s))  Blood Culture (routine x 2)     Status: None   Collection Time: 09/03/21  6:30 PM   Specimen: BLOOD  Result Value Ref Range Status   Specimen Description BLOOD SITE NOT SPECIFIED  Final   Special Requests   Final    BOTTLES DRAWN AEROBIC AND ANAEROBIC Blood Culture adequate volume   Culture   Final    NO GROWTH 5 DAYS Performed at [facility], [place] [address], [place], [place] [address]    Report Status 09/08/2021 FINAL  Final  Blood Culture (routine x 2)     Status: None   Collection Time: 09/03/21  6:43 PM   Specimen: BLOOD RIGHT HAND  Result Value Ref Range Status   Specimen Description BLOOD RIGHT HAND  Final   Special Requests   Final    BOTTLES DRAWN AEROBIC AND ANAEROBIC Blood Culture results may not be optimal due to an inadequate volume of blood received in culture bottles   Culture   Final    NO GROWTH 5 DAYS Performed at [facility], [place] [address], [place], [place] [address]    Report Status 09/08/2021 FINAL  Final  Resp Panel by RT-PCR (Flu A&B, Covid) Anterior Nasal Swab     Status: None   Collection Time: 09/03/21  6:59 PM   Specimen: Anterior Nasal Swab  Result Value Ref Range Status   SARS Coronavirus 2 by RT PCR NEGATIVE NEGATIVE Final    Comment: (NOTE) SARS-CoV-2 target nucleic acids are NOT DETECTED.  The SARS-CoV-2 RNA is generally detectable in upper respiratory specimens during the acute phase of infection. The lowest concentration of SARS-CoV-2 viral copies this assay can detect is 138 copies/mL. A negative result does not preclude SARS-Cov-2 infection and should not be used as the sole basis for treatment or other patient management decisions. A negative result may occur with  improper specimen collection/handling, submission of specimen other than nasopharyngeal swab, presence of viral mutation(s) within the areas targeted by this assay,  and inadequate number of viral copies(<138 copies/mL). A negative result must be combined with clinical observations, patient history, and epidemiological information. The expected result is Negative.  Fact Sheet for Patients:  EntrepreneurPulse.com.au  Fact Sheet for Healthcare Providers:  IncredibleEmployment.be  This test is no t yet approved or cleared by the [place] FDA and  has been authorized for detection and/or diagnosis of SARS-CoV-2 by FDA under an Emergency Use Authorization (EUA). This EUA will remain  in effect (meaning this test can be used) for the duration of the COVID-19 declaration under Section 564(b)(1) of  the Act, 21 U.S.C.section 360bbb-3(b)(1), unless the authorization is terminated  or revoked sooner.       Influenza A by PCR NEGATIVE NEGATIVE Final   Influenza B by PCR NEGATIVE NEGATIVE Final    Comment: (NOTE) The Xpert Xpress SARS-CoV-2/FLU/RSV plus assay is intended as an aid in the diagnosis of influenza from Nasopharyngeal swab specimens and should not be used as a sole basis for treatment. Nasal washings and aspirates are unacceptable for Xpert Xpress SARS-CoV-2/FLU/RSV testing.  Fact Sheet for Patients: EntrepreneurPulse.com.au  Fact Sheet for Healthcare Providers: IncredibleEmployment.be  This test is not yet approved or cleared by the [place] FDA and has been authorized for detection and/or diagnosis of SARS-CoV-2 by FDA under an Emergency Use Authorization (EUA). This EUA will remain in effect (meaning this test can be used) for the duration of the COVID-19 declaration under Section 564(b)(1) of the Act, 21 U.S.C. section 360bbb-3(b)(1), unless the authorization is terminated or revoked.  Performed at [facility], [place] [address], [place], [place] [address]   Urine Culture     Status: Abnormal   Collection Time: 09/03/21  7:05 PM   Specimen:  In/Out Cath Urine  Result Value Ref Range Status   Specimen Description IN/OUT CATH URINE  Final   Special Requests   Final    NONE Performed at [facility], [place] [address], [place], [place] [address]    Culture >=100,000 COLONIES/mL ESCHERICHIA COLI (A)  Final   Report Status 09/06/2021 FINAL  Final   Organism ID, Bacteria ESCHERICHIA COLI (A)  Final      Susceptibility   Escherichia coli - MIC*    AMPICILLIN >=32 RESISTANT Resistant     CEFAZOLIN 8 SENSITIVE Sensitive     CEFEPIME <=0.12 SENSITIVE Sensitive     CEFTRIAXONE <=0.25 SENSITIVE Sensitive     CIPROFLOXACIN <=0.25 SENSITIVE Sensitive     GENTAMICIN <=1 SENSITIVE Sensitive     IMIPENEM <=0.25 SENSITIVE Sensitive     NITROFURANTOIN <=16 SENSITIVE Sensitive     TRIMETH/SULFA <=20 SENSITIVE Sensitive     AMPICILLIN/SULBACTAM 16 INTERMEDIATE Intermediate     PIP/TAZO <=4 SENSITIVE Sensitive     * >=100,000 COLONIES/mL ESCHERICHIA COLI     Radiology Studies: No results found.   Scheduled Meds:  [MAR Hold] allopurinol  100 mg Oral Daily   [MAR Hold] amiodarone  200 mg Oral Daily   [MAR Hold] atorvastatin  80 mg Oral QHS   [MAR Hold] cephALEXin  250 mg Oral QID   [MAR Hold] diltiazem  180 mg Oral Daily   [MAR Hold] escitalopram  10 mg Oral Daily   [MAR Hold] fentaNYL  1 patch Transdermal Q72H   [MAR Hold] fentaNYL  1 patch Transdermal Q72H   [MAR Hold] furosemide  40 mg Oral Daily   [MAR Hold] pantoprazole  40 mg Oral BID   [MAR Hold] predniSONE  10 mg Oral Q breakfast   [MAR Hold] QUEtiapine  25 mg Oral QHS   Continuous Infusions:  sodium chloride     [MAR Hold] potassium chloride 10 mEq (09/10/21 1112)      LOS: 7 days    Time spent: 29mn   SIGNED: SDeatra James MD, FHM. [facility],  Pager (please use [email or web address] to page/text)  Please use Epic Secure Chat for non-urgent communication (7AM-7PM) If 7PM-7AM, please contact night-coverage [email or web address],  09/10/2021, 3:10 PM

## 2021-09-11 DIAGNOSIS — N189 Chronic kidney disease, unspecified: Secondary | ICD-10-CM

## 2021-09-11 DIAGNOSIS — Z8679 Personal history of other diseases of the circulatory system: Secondary | ICD-10-CM

## 2021-09-11 DIAGNOSIS — N179 Acute kidney failure, unspecified: Secondary | ICD-10-CM

## 2021-09-11 DIAGNOSIS — K649 Unspecified hemorrhoids: Secondary | ICD-10-CM

## 2021-09-11 DIAGNOSIS — D649 Anemia, unspecified: Secondary | ICD-10-CM

## 2021-09-11 DIAGNOSIS — A419 Sepsis, unspecified organism: Secondary | ICD-10-CM | POA: Diagnosis not present

## 2021-09-11 DIAGNOSIS — K626 Ulcer of anus and rectum: Secondary | ICD-10-CM

## 2021-09-11 DIAGNOSIS — K209 Esophagitis, unspecified without bleeding: Secondary | ICD-10-CM | POA: Diagnosis not present

## 2021-09-11 DIAGNOSIS — I480 Paroxysmal atrial fibrillation: Secondary | ICD-10-CM

## 2021-09-11 DIAGNOSIS — N390 Urinary tract infection, site not specified: Secondary | ICD-10-CM

## 2021-09-11 LAB — CBC
HCT: 29.1 % — ABNORMAL LOW (ref 36.0–46.0)
Hemoglobin: 9.2 g/dL — ABNORMAL LOW (ref 12.0–15.0)
MCH: 28.9 pg (ref 26.0–34.0)
MCHC: 31.6 g/dL (ref 30.0–36.0)
MCV: 91.5 fL (ref 80.0–100.0)
Platelets: 290 10*3/uL (ref 150–400)
RBC: 3.18 MIL/uL — ABNORMAL LOW (ref 3.87–5.11)
RDW: 14.7 % (ref 11.5–15.5)
WBC: 14.8 10*3/uL — ABNORMAL HIGH (ref 4.0–10.5)
nRBC: 0.1 % (ref 0.0–0.2)

## 2021-09-11 LAB — BASIC METABOLIC PANEL
Anion gap: 8 (ref 5–15)
BUN: 12 mg/dL (ref 8–23)
CO2: 30 mmol/L (ref 22–32)
Calcium: 8.4 mg/dL — ABNORMAL LOW (ref 8.9–10.3)
Chloride: 102 mmol/L (ref 98–111)
Creatinine, Ser: 0.97 mg/dL (ref 0.44–1.00)
GFR, Estimated: 57 mL/min — ABNORMAL LOW (ref >60)
Glucose, Bld: 126 mg/dL — ABNORMAL HIGH (ref 70–99)
Potassium: 3.2 mmol/L — ABNORMAL LOW (ref 3.5–5.1)
Sodium: 140 mmol/L (ref 135–145)

## 2021-09-11 MED ORDER — CEPHALEXIN 250 MG PO CAPS: 250.0000 mg | ORAL_CAPSULE | Freq: Three times a day (TID) | ORAL | 0 refills | Status: AC

## 2021-09-11 MED ORDER — HYDROCODONE-ACETAMINOPHEN 5-325 MG PO TABS: 1.0000 | ORAL_TABLET | Freq: Two times a day (BID) | ORAL | 0 refills | Status: DC | PRN

## 2021-09-11 MED ORDER — POTASSIUM CHLORIDE CRYS ER 20 MEQ PO TBCR
30.0000 meq | EXTENDED_RELEASE_TABLET | Freq: Two times a day (BID) | ORAL | Status: DC
  Administered 2021-09-11: 30 meq via ORAL
  Filled 2021-09-11: qty 1

## 2021-09-11 MED ORDER — POLYETHYLENE GLYCOL 3350 17 G PO PACK: 17.0000 g | PACK | Freq: Every day | ORAL | 2 refills | Status: DC

## 2021-09-11 MED ORDER — HEMORRHOIDAL MAX STR 1-0.25-14.4-15 % EX CREA: 1.0000 | TOPICAL_CREAM | Freq: Two times a day (BID) | CUTANEOUS | 0 refills | Status: AC | PRN

## 2021-09-11 MED ORDER — POTASSIUM CHLORIDE CRYS ER 10 MEQ PO TBCR: 30.0000 meq | EXTENDED_RELEASE_TABLET | Freq: Every day | ORAL | Status: DC

## 2021-09-11 MED ORDER — FUROSEMIDE 40 MG PO TABS: 40.0000 mg | ORAL_TABLET | Freq: Every day | ORAL | Status: AC | PRN

## 2021-09-11 NOTE — TOC Progression Note (Signed)
Transition of Care [facility]) - Initial/Assessment Note    Patient Details  Name: Betty Jackson MRN: [id number] Date of Birth: 05-Dec-1934  Transition of Care [facility]) CM/SW Contact:    Milinda Antis, LCSWA Phone Number: 09/11/2021, 8:44 AM  Clinical Narrative:                 08:45-  CSW contacted Tiburcio Bash with admissions at [facility] [phone]).  There was no answer.  CSW left a secure VM informing Ms. Ola Spurr that the patient is medically ready to d/c back to the facility and transfer information is needed.  CSW is awaiting a returned call.   Expected Discharge Plan: Skilled Nursing Facility Barriers to Discharge: Continued Medical Work up   Patient Goals and CMS Choice Patient states their goals for this hospitalization and ongoing recovery are:: to return to rehab      Expected Discharge Plan and Services Expected Discharge Plan: [place]       Living arrangements for the past 2 months: [place] Expected Discharge Date: 09/11/21                                    Prior Living Arrangements/Services Living arrangements for the past 2 months: [place] Lives with:: Facility Resident Patient language and need for interpreter reviewed:: Yes Do you feel safe going back to the place where you live?: Yes      Need for Family Participation in Patient Care: Yes (Comment) Care giver support system in place?: Yes (comment)   Criminal Activity/Legal Involvement Pertinent to Current Situation/Hospitalization: No - Comment as needed  Activities of Daily Living Home Assistive Devices/Equipment: Environmental consultant (specify type), Wheelchair, Grab bars in shower, Grab bars around toilet, Hand-held shower hose, Shower chair with back ADL Screening (condition at time of admission) Patient's cognitive ability adequate to safely complete daily activities?: Yes Is the patient deaf or have difficulty hearing?: No Does the patient  have difficulty seeing, even when wearing glasses/contacts?: No Does the patient have difficulty concentrating, remembering, or making decisions?: No Patient able to express need for assistance with ADLs?: Yes Does the patient have difficulty dressing or bathing?: Yes Independently performs ADLs?: No Communication: Independent Dressing (OT): Needs assistance Is this a change from baseline?: Pre-admission baseline Grooming: Needs assistance Is this a change from baseline?: Pre-admission baseline Feeding: Independent Bathing: Needs assistance Is this a change from baseline?: Pre-admission baseline Toileting: Needs assistance Is this a change from baseline?: Pre-admission baseline In/Out Bed: Needs assistance Is this a change from baseline?: Pre-admission baseline Walks in Home: Needs assistance Is this a change from baseline?: Pre-admission baseline Does the patient have difficulty walking or climbing stairs?: Yes Weakness of Legs: Both Weakness of Arms/Hands: Both  Permission Sought/Granted Permission sought to share information with : Other (comment) (CSW)    Share Information with NAME: daughter [name]  Permission granted to share info w AGENCY: SNF        Emotional Assessment   Attitude/Demeanor/Rapport: Engaged   Orientation: : Oriented to Self, Oriented to Place Alcohol / Substance Use: Not Applicable Psych Involvement: No (comment)  Admission diagnosis:  Acute kidney injury ([place]) [N17.9] Sepsis ([place]) [A41.9] Severe sepsis ([place]) [A41.9, R65.20] Urinary tract infection without hematuria, site unspecified [N39.0] Patient Active Problem List   Diagnosis Date Noted   Urinary tract infection without hematuria    Bleeding hemorrhoids    Rectal ulcer  Hematochezia    Odynophagia    Esophagitis    Lung nodules 09/04/2021   Sepsis secondary to UTI ([place]) 09/03/2021   Atrial fibrillation with RVR ([place]) 08/06/2021   Dysphagia 02/72/5366   Acute metabolic  encephalopathy 44/05/4740   Hypoxemia 03/15/2021   Acute kidney injury superimposed on chronic kidney disease ([place]) 03/15/2021   Stroke-like symptoms 07/14/2020   Cough 05/24/2020   Dyspnea 05/24/2020   Optic neuritis 05/24/2020   Osteopenia 05/24/2020   Rash 05/24/2020   Vitamin D deficiency 05/24/2020   Palliative care by specialist    Goals of care, counseling/discussion    DNR (do not resuscitate)    Weakness    Advanced directives, counseling/discussion    Pressure injury of skin 10/18/2019   Closed fracture of right distal femur ([place]) 10/17/2019   Femur fracture, right ([place]) 09/25/2019   Chronic diastolic CHF (congestive heart failure) ([place]) 09/25/2019   HCAP (healthcare-associated pneumonia) 06/19/2019   Encephalopathy 06/19/2019   Lumbar degenerative disc disease    Paroxysmal A-fib ([place])    Hypomagnesemia    C. difficile colitis    Diarrhea 05/29/2019   CHF (congestive heart failure) ([place]) 05/29/2019   Recurrent falls 04/30/2019   Pericardial effusion without cardiac tamponade 03/29/2019   Hypoalbuminemia 03/27/2019   Cardiac tamponade    Pericarditis 03/06/2019   Fever    Septic shock ([place])    Atypical chest pain    Atrial fibrillation with rapid ventricular response (HCC)    Nausea and vomiting 01/17/2019   Acute diverticulitis 01/13/2019   Chronic back pain 01/13/2019   Tachycardia 01/13/2019   Hypokalemia 01/13/2019   Chronic low back pain 07/10/2018   Influenza A 05/20/2017   Severe sepsis ([place]) 05/20/2017   Acute respiratory failure with hypoxia ([place]) 05/20/2017   CKD (chronic kidney disease), stage III ([place]) 05/20/2017   PAF (paroxysmal atrial fibrillation) ([place]) 05/20/2017   HTN (hypertension) 05/20/2017   Giant cell arteritis ([place]) 05/20/2017   Coronary disease 05/20/2017   Abnormal urinalysis 05/20/2017   Osteoporosis 05/08/2016   Herniated nucleus pulposus, L5-S1, right 11/04/2015   Major depressive disorder, recurrent episode, moderate ([place])  07/16/2013   Abdominal pain, epigastric 06/22/2013   Lumbar stenosis 04/25/2013   Chronic insomnia 02/07/2013   Elevated transaminase level 02/07/2013   CVA (cerebral infarction) 10/29/2012   Visual changes 10/28/2012   Depression 10/28/2012   Anxiety state 10/28/2012   Acute on chronic anemia 10/28/2012   Nonspecific (abnormal) findings on radiological and other examination of gastrointestinal tract 01/25/2012   Hyponatremia 01/24/2012   Hematuria 01/24/2012   Enteritis 12/24/2011   Abdominal pain, other specified site 12/24/2011   Leg pain, right 02/18/2011   Rheumatoid arthritis ([place]) 02/04/2011   Temporal arteritis ([place]) 09/01/2010   Hypertension 09/01/2010   Hyperlipidemia 09/01/2010   History of atrial fibrillation 09/01/2010   CAD (coronary artery disease) 09/01/2010   GERD (gastroesophageal reflux disease) 09/01/2010   Insomnia 09/01/2010   PCP:  Alvester Morin, MD Pharmacy:  No Pharmacies Listed    Social Determinants of Health (SDOH) Interventions    Readmission Risk Interventions    10/30/2019    9:48 AM 09/26/2019    4:29 PM 06/21/2019    4:12 PM  Readmission Risk Prevention Plan  Transportation Screening Complete Complete Complete  Medication Review Press photographer) Complete Complete Complete  PCP or Specialist appointment within 3-5 days of discharge Complete Complete Not Complete  PCP/Specialist Appt Not Complete comments   likely SNF placement  HRI or [place]  Complete Complete Not Complete  HRI or Home Care Consult Pt Refusal Comments   likely SNF  SW Recovery Care/Counseling Consult Complete Complete Complete  Palliative Care Screening Complete Complete Complete  Skilled Nursing Facility Not Applicable Complete Complete

## 2021-09-11 NOTE — TOC Transition Note (Signed)
Transition of Care [facility]) - CM/SW Discharge Note   Patient Details  Name: Betty Jackson MRN: [id number] Date of Birth: 04-13-1934  Transition of Care [facility]) CM/SW Contact:  Milinda Antis, [place] Phone Number: 09/11/2021, 10:52 AM   Clinical Narrative:    Patient will DC to: [facility] SNF Anticipated DC date:  09/11/2021 Family notified: Yes Transport by: Worthy Rancher   Per MD patient ready for DC to SNF. RN to call report prior to discharge [phone] room 104A.  Ask for the 100 hall RN. RN, patient, patient's family, and facility notified of DC. Discharge Summary and FL2 sent to facility. DC packet on chart. Family will transport patient.   CSW will sign off for now as social work intervention is no longer needed. Please consult [place] again if new needs arise.     Final next level of care: Skilled Nursing Facility Barriers to Discharge: Barriers Resolved   Patient Goals and CMS Choice Patient states their goals for this hospitalization and ongoing recovery are:: to return to rehab      Discharge Placement              Patient [facility] bed at:  [facility]) Patient to be transferred to facility by: [facility] Name of family member notified: Heywood Bene (Granddaughter)   [phone] Patient and family notified of of transfer: 09/11/21  Discharge Plan and Services                                     Social Determinants of Health ([place]) Interventions     Readmission Risk Interventions    10/30/2019    9:48 AM 09/26/2019    4:29 PM 06/21/2019    4:12 PM  Readmission Risk Prevention Plan  Transportation Screening Complete Complete Complete  Medication Review Press photographer) Complete Complete Complete  PCP or Specialist appointment within 3-5 days of discharge Complete Complete Not Complete  PCP/Specialist Appt Not Complete comments   likely SNF placement  [place] or Home Care Consult Complete Complete Not Complete  HRI or Home Care  Consult Pt Refusal Comments   likely SNF  SW Recovery Care/Counseling Consult Complete Complete Complete  Palliative Care Screening Complete Complete Complete  Skilled Nursing Facility Not Applicable Complete Complete

## 2021-09-11 NOTE — Discharge Summary (Signed)
Physician Discharge Summary   Patient: Betty Jackson MRN: [id number] DOB: 11/05/1934  Admit date:     09/03/2021  Discharge date: 09/11/21  Discharge Physician: Barton Dubois   PCP: Alvester Morin, MD   Recommendations at discharge:  Repeat CBC in 1 week to follow hemoglobin trend/stability Repeat basic metabolic panel in 1 week to follow electrolytes and renal function.  Discharge Diagnoses: Principal Problem:   Sepsis secondary to UTI [facility]) Active Problems:   History of atrial fibrillation   CAD (coronary artery disease)   Acute on chronic anemia   PAF (paroxysmal atrial fibrillation) (HCC)   Nausea and vomiting   Diarrhea   CHF (congestive heart failure) (HCC)   Acute kidney injury superimposed on chronic kidney disease (HCC)   Lung nodules   Hematochezia   Odynophagia   Esophagitis   Bleeding hemorrhoids   Rectal ulcer   Urinary tract infection without hematuria  Resolved Problems:   * No resolved hospital problems. [facility] Course: As per H&P written by Dr. Broadus John on 09/03/2021  Betty Jackson is a 87 y.o. female with medical history significant of CAD, hypertension, hyperlipidemia, PAF on Eliquis, CKD stage IIIa, CAD status post DES to RCA in 2005, HFpEF, CVA, rheumatoid arthritis/giant cell arteritis on chronic prednisone, anxiety/depression, chronic insomnia, chronic low back pain, GERD, osteoporosis, chronic hypoxic respiratory failure on baseline 2 L oxygen, wheel chair bound SNF resident.  Recently admitted 5/9 for dysphagia and underwent EGD which revealed severe esophagitis with bleeding and gastritis.  This was felt to be medication induced esophagitis due to alendronate which was discontinued.  Went into A-fib with RVR during this hospitalization, treated with IV amiodarone and discharged on oral amiodarone.  Metoprolol was discontinued due to bradycardia and Cardizem CD was held.  Patient also finished a 7-day antibiotic course for aspiration pneumonia.  Patient had a hospital follow-up visit with GI on 5/30, Carafate discontinued and was advised to continue taking Protonix 40 mg twice daily, scheduled for repeat EGD on 7/17.  Seen by cardiology yesterday, dose of Lasix was increased to 80 mg daily x2 days due to lower extremity edema, then return to 40 mg daily.  Recommended continuing Eliquis 5 mg twice daily, amiodarone 200 mg daily, and diltiazem 180 mg daily.   Patient presented to the ED complaining of jaw pain in the setting of lower tooth extraction approximately 4 months ago.  Febrile and slightly tachycardic.  Labs showing WBC 12.2, hemoglobin 11.7 (stable), platelet count 203k.  Sodium 138, potassium 4.4, chloride 99, bicarb 27, BUN 29, creatinine 1.8 (baseline 0.9-1.0), glucose 188.  High-sensitivity troponin negative.  UA with large amount of leukocytes and microscopy showing greater than 50 WBCs and many bacteria.  Urine culture pending.  Blood cultures drawn.  Lactic acid 1.5.  INR 1.7.  COVID and influenza PCR negative.     Chest x-ray showing stable left basilar opacities favored to be atelectasis or scarring.  There is one area which is slightly nodular, follow-up chest CT recommended.  No new focal lung infiltrate.   CT soft tissue neck with contrast showing no significant inflammatory changes or abscess.   Patient was given Tylenol, ceftriaxone, and 2 L normal saline boluses.   Patient reports 1 day history of nausea, vomiting, and fevers.  Also reports suprapubic discomfort, dysuria, and urinary frequency/urgency.  She thinks she is also having diarrhea but has not seen her stool.  Denies cough, shortness of breath, or chest pain.  States she had her  lower teeth extracted back in February and thinks it still has not healed.  She was supposed to see her oral surgeon yesterday but instead came into the ED as she was not feeling well.  Assessment and Plan: Sepsis secondary to UTI -Improved/resolved sepsis physiology -Patient has met  criteria for sepsis with fever, tachycardia, and leukocytosis -was on antibiotics of IV ceftriaxone, urine cultures with E. Coli-pansensitive, blood cultures negative, transitioned to oral Keflex to complete antibiotic therapy. -Initially placed on stress dose steroids, which has now been discontinued and patient back to chronic prednisone regimen. -Continue to maintain adequate hydration. -Discharge to SNF with instruction to follow-up with PCP in 10 days.   Hematochezia with clots -Multiple episodes this weekend with hematochezia and clots -Eliquis held 6/11 PM, hemoglobin is stable, recent esophagitis, clinically do not suspect upper GI bleed, BUN is stable as well -Followed by [facility], appreciate their assistance and recommendations. -Status post endoscopy and colonoscopy on 09/10/2021 (hemorrhoids, small soft ulcer distal rectum, 2 polyps removed from descending colon, multiple diverticular findings in the sigmoid colon and descending colon without presence of diverticulitis.  Endoscopy demonstrated normal esophagus, patchy erythema in the gastric antrum otherwise normal study). -Daily MiraLAX, as needed Anusol cream and continue PPI has been recommended. -Safe to resume Eliquis.    AKI on CKD IIIa -Secondary to sepsis and UTI. -Improving and essentially Back to baseline at discharge -Safe to resume losartan and continue the use of lasix. -Complete antibiotic therapy as instructed  -maintain adequate hydration and minimize the use of nephrotoxic agents.   Recent severe esophagitis -Secondary to bisphosphonates -Continue PPI given severe esophagitis and gastritis -Per EGD today 09/10/2021 severe esophagitis has improved -Lifestyle changes discussed with patient.   Acute on chronic diastolic CHF  -patient experienced mildly volume overloaded secondary to fluid resuscitation for sepsis. -echo done in January 2021 showing LVEF 60 to 38%, grade 2 diastolic dysfunction reported. -Resume  home antihypertensive agents on the use of Lasix. -Low-sodium diet discussed with patient; continue to follow daily weights. -Maintain adequate hydration.   Chronic anemia,  -Hemoglobin stable and without signs of overt bleeding currently. -Continue to hemoglobin trend/stability.   CAD -Stable, no chest pain. -Continue risk factor modification -Continue the use of a statin.   PAF -currently in sinus rhythm. -Continue Amiodarone, and Diltiazem.   -Continue the use of Eliquis for secondary prevention.      Tooth pain Patient had her lower incisors extracted 4 months ago. No gum swelling or obvious signs of oral infection at this time. CT soft tissue neck with contrast showing no significant inflammatory changes or abscess. -Outpatient follow-up with her oral surgeon recommended   Rheumatoid arthritis -Back on on prednisone 10 Mg daily at home dose, stress dose steroids discontinued. -Vital signs are stable.   Anxiety/ depression Chronic insomnia -Continue Lexapro and Seroquel -Overall mood is stable.   Chronic hypoxic respiratory failure on 2L O2 -No change in O2 requirement from baseline.  Remains on 2 L of oxygen, satting 91% -Continue supplemental O2   Multiple lung nodules -Could be secondary to rheumatoid arthritis -Will recommend outpatient follow-up -Currently without red flags.   Gout -Continue allopurinol -Adequate hydration and lifestyle changes discussed with patient. -No acute flare appreciated.   Hypokalemia  -Continue potassium maintenance supplementation -Dose has been adjusted to maintain stability -Continue to follow electrolytes intermittently and make further adjustment as needed.   chronic pain -Continue home analgesic regimen.  Consultants: Gastroenterology service Procedures performed: See below for x-ray  report; EGD/colonoscopy (refer to GI dictation for details). Disposition: Skilled nursing facility Diet recommendation: Heart healthy  diet.  DISCHARGE MEDICATION: Allergies as of 09/11/2021       Reactions   Alendronate Sodium    Suspected cause of severe esophagitis (per EGD 08/05/21)   Dextromethorphan Rash        Medication List     STOP taking these medications    amoxicillin 500 MG capsule Commonly known as: AMOXIL   amoxicillin 500 MG tablet Commonly known as: AMOXIL   losartan 50 MG tablet Commonly known as: COZAAR   sucralfate 1 GM/10ML suspension Commonly known as: CARAFATE       TAKE these medications    acetaminophen 500 MG tablet Commonly known as: TYLENOL Take 500 mg by mouth every 6 (six) hours as needed for mild pain.   allopurinol 100 MG tablet Commonly known as: ZYLOPRIM Take 100 mg by mouth daily.   amiodarone 200 MG tablet Commonly known as: PACERONE Take 265m twice daily for 5 days, then take 2041monce daily. What changed:  how much to take how to take this when to take this additional instructions   atorvastatin 80 MG tablet Commonly known as: Lipitor Take 1 tablet (80 mg total) by mouth daily. What changed: when to take this   cephALEXin 250 MG capsule Commonly known as: KEFLEX Take 1 capsule (250 mg total) by mouth 3 (three) times daily for 2 days.   cetirizine 10 MG tablet Commonly known as: ZYRTEC Take 10 mg by mouth at bedtime.   diltiazem 180 MG 24 hr capsule Commonly known as: CARDIZEM CD Take 180 mg by mouth daily.   Eliquis 5 MG Tabs tablet Generic drug: apixaban TAKE 1 TABLET BY MOUTH TWICE A DAY What changed: how much to take   escitalopram 10 MG tablet Commonly known as: LEXAPRO TAKE 1 TABLET BY MOUTH EVERY DAY   fentaNYL 37.5 MCG/HR Pt72 Place 1 patch onto the skin every 3 (three) days.   ferrous sulfate 325 (65 FE) MG tablet Take 1 tablet (325 mg total) by mouth 2 (two) times daily with a meal.   furosemide 40 MG tablet Commonly known as: LASIX Take 40 mg by mouth daily. What changed:  Another medication with the same name was  changed. Make sure you understand how and when to take each. Another medication with the same name was removed. Continue taking this medication, and follow the directions you see here.   furosemide 40 MG tablet Commonly known as: LASIX Take 1 tablet (40 mg total) by mouth daily as needed (extra dose, for weight gain of 2 pounds overnight or 5 pound in 1 week). What changed:  reasons to take this Another medication with the same name was removed. Continue taking this medication, and follow the directions you see here.   Hemorrhoidal Max Str 1-0.25-14.4-15 % Crea Apply 1 Application topically 2 (two) times daily as needed (hemorrhoidal pain/discomfort.).   HYDROcodone-acetaminophen 5-325 MG tablet Commonly known as: Norco Take 1 tablet by mouth every 12 (twelve) hours as needed for severe pain. What changed: reasons to take this   ipratropium-albuterol 0.5-2.5 (3) MG/3ML Soln Commonly known as: DUONEB Take 3 mLs by nebulization every 4 (four) hours as needed. What changed: reasons to take this   multivitamin with minerals Tabs tablet Take 1 tablet by mouth daily.   Nutritional Supplement Liqd Take 120 mLs by mouth daily. House stock supplement   ondansetron 4 MG tablet Commonly known as: ZOFRAN Take  4 mg by mouth every 8 (eight) hours as needed for nausea or vomiting.   pantoprazole 40 MG tablet Commonly known as: PROTONIX Take 1 tablet (40 mg total) by mouth 2 (two) times daily.   polyethylene glycol 17 g packet Commonly known as: MiraLax Take 17 g by mouth daily.   potassium chloride 10 MEQ tablet Commonly known as: KLOR-CON M Take 3 tablets (30 mEq total) by mouth daily. What changed: how much to take   predniSONE 10 MG tablet Commonly known as: DELTASONE Take 1 tablet (10 mg total) by mouth daily with breakfast. Resume after 3 days of 76m daily as instructed.   QUEtiapine 25 MG tablet Commonly known as: SEROQUEL Take 1 tablet (25 mg total) by mouth at bedtime.    sulfaSALAzine 500 MG EC tablet Commonly known as: AZULFIDINE Take 500 mg by mouth daily.   Vitamin D3 25 MCG tablet Commonly known as: Vitamin D Take 2 tablets (2,000 Units total) by mouth 2 (two) times daily.        Follow-up Information     SAlvester Morin MD. Schedule an appointment as soon as possible for a visit in 10 day(s).   Specialty: Family Medicine Contact information: [phone]         MBurnell Blanks MD .   Specialty: Cardiology Contact information: [address] [place] [phone]                Discharge Exam: FDanley DankerWeights   09/04/21 1424  Weight: 82.7 kg   General exam: Alert, awake, oriented x 3; cooperative with examination and demonstrating no distress.  No overt bleeding reported. Respiratory system: Good air movement on chronic supplementation; no wheezing, no crackles. Cardiovascular system: Rate controlled and currently sinus rhythm appreciated; no rubs, no gallops, no JVD. Gastrointestinal system: Abdomen is nondistended, soft and nontender. No organomegaly or masses felt. Normal bowel sounds heard. Central nervous system: Generally weak; no focal neurological deficits. Extremities: No cyanosis or clubbing. Skin: No petechiae. Psychiatry: Judgement and insight appear normal. Mood & affect appropriate.    Condition at discharge: Stable and improved.  The results of significant diagnostics from this hospitalization (including imaging, microbiology, ancillary and laboratory) are listed below for reference.   Imaging Studies: CT CHEST WO CONTRAST  Result Date: 09/04/2021 CLINICAL DATA:  Questionable sepsis. EXAM: CT CHEST WITHOUT CONTRAST TECHNIQUE: Multidetector CT imaging of the chest was performed following the standard protocol without IV contrast. RADIATION DOSE REDUCTION: This exam was performed according to the departmental  dose-optimization program which includes automated exposure control, adjustment of the mA and/or kV according to patient size and/or use of iterative reconstruction technique. COMPARISON:  Chest radiograph dated 09/03/2021. Chest CT dated 04/15/2019. FINDINGS: Evaluation of this exam is limited in the absence of intravenous contrast. Cardiovascular: There is no cardiomegaly or pericardial effusion. Three-vessel coronary vascular calcification. Mild atherosclerotic calcification of the thoracic aorta. No aneurysmal dilatation. The central pulmonary arteries are grossly unremarkable. Mediastinum/Nodes: Left hilar and mediastinal calcified lymph node or granuloma. Small hiatal hernia. The esophagus is grossly unremarkable. No mediastinal fluid collection. Lungs/Pleura: Left lung base patchy atelectasis. Pneumonia is not excluded. No large pleural effusion. No pneumothorax. The central airways are patent. A 4 mm right lower lobe nodule (74/6) present on the prior CT. A 5 mm nodule in the right middle lobe (79/6). Additional 4 mm right lower lobe nodule (80/6). Upper Abdomen: Cholecystectomy.  Scattered colonic diverticula.  Musculoskeletal: Osteopenia with degenerative changes of the spine. Age indeterminate T7 compression fracture with approximately 50% loss of vertebral body height, new since the prior CT. Correlation with clinical exam and point tenderness recommended. T11 and L1 vertebroplasty changes. IMPRESSION: 1. Left lung base patchy atelectasis. Pneumonia is not excluded. 2. Several pulmonary nodules measure up to 5 mm. No follow-up needed if patient is low-risk (and has no known or suspected primary neoplasm). Non-contrast chest CT can be considered in 12 months if patient is high-risk. This recommendation follows the consensus statement: Guidelines for Management of Incidental Pulmonary Nodules Detected on CT Images: From the Fleischner Society 2017; Radiology 2017; 284:228-243. 3. Age indeterminate T7  compression fracture, new since the prior CT. Correlation with clinical exam and point tenderness recommended. 4. Aortic Atherosclerosis (ICD10-I70.0). Electronically Signed   By: Anner Crete M.D.   On: 09/04/2021 03:40   DG Chest Port 1 View  Result Date: 09/03/2021 CLINICAL DATA:  Questionable sepsis. EXAM: PORTABLE CHEST 1 VIEW COMPARISON:  Chest x-ray 08/06/2021. CT abdomen and pelvis 07/17/2020. FINDINGS: There is some stable linear and patchy airspace opacity in the left lower lung. One area is slightly nodular. Lungs are otherwise clear. No pleural effusion or pneumothorax. Cardiomediastinal silhouette is within normal limits. IMPRESSION: 1. Stable left basilar opacities favored is atelectasis or scarring. One area is slightly nodular. A follow-up chest CT should be considered. 2. No new focal lung infiltrate. Electronically Signed   By: Ronney Asters M.D.   On: 09/03/2021 19:04   CT Soft Tissue Neck W Contrast  Result Date: 09/03/2021 CLINICAL DATA:  Soft tissue swelling, infection suspected; Post-operative lower gumline pain near incisor sites, swelling - evaluate for underlying tracking abscess (lower mouth) EXAM: CT NECK WITH CONTRAST TECHNIQUE: Multidetector CT imaging of the neck was performed using the standard protocol following the bolus administration of intravenous contrast. RADIATION DOSE REDUCTION: This exam was performed according to the departmental dose-optimization program which includes automated exposure control, adjustment of the mA and/or kV according to patient size and/or use of iterative reconstruction technique. CONTRAST:  41m OMNIPAQUE IOHEXOL 300 MG/ML  SOLN COMPARISON:  None Available. FINDINGS: Motion artifact is present at some levels. Pharynx and larynx: Unremarkable. No mass or swelling within limitation of streak artifact from dental amalgam. Salivary glands: Parotid and submandibular glands are unremarkable. Thyroid: Unremarkable. Lymph nodes: No enlarged or  abnormal density nodes. Vascular: Major neck vessels are patent. Calcified plaque at the common carotid bifurcations. Limited intracranial: No abnormal enhancement. Visualized orbits: Bilateral lens replacements. Mastoids and visualized paranasal sinuses: Mild mucosal thickening. Mastoid air cells are clear. Skeleton: Advanced degenerative changes of the cervical spine. Upper chest: No apical lung mass. Other: None. IMPRESSION: No significant inflammatory changes identified.  No abscess. Electronically Signed   By: PMacy MisM.D.   On: 09/03/2021 17:57    Microbiology: Results for orders placed or performed during the hospital encounter of 09/03/21  Blood Culture (routine x 2)     Status: None   Collection Time: 09/03/21  6:30 PM   Specimen: BLOOD  Result Value Ref Range Status   Specimen Description BLOOD SITE NOT SPECIFIED  Final   Special Requests   Final    BOTTLES DRAWN AEROBIC AND ANAEROBIC Blood Culture adequate volume   Culture   Final    NO GROWTH 5 DAYS Performed at [facility] [address] [place] [address]   Report Status 09/08/2021 FINAL  Final  Blood Culture (routine x  2)     Status: None   Collection Time: 09/03/21  6:43 PM   Specimen: BLOOD RIGHT HAND  Result Value Ref Range Status   Specimen Description BLOOD RIGHT HAND  Final   Special Requests   Final    BOTTLES DRAWN AEROBIC AND ANAEROBIC Blood Culture results may not be optimal due to an inadequate volume of blood received in culture bottles   Culture   Final    NO GROWTH 5 DAYS Performed at [facility], [place] [address], [place], [place] [address]    Report Status 09/08/2021 FINAL  Final  Resp Panel by RT-PCR (Flu A&B, Covid) Anterior Nasal Swab     Status: None   Collection Time: 09/03/21  6:59 PM   Specimen: Anterior Nasal Swab  Result Value Ref Range Status   SARS Coronavirus 2 by RT PCR NEGATIVE NEGATIVE Final    Comment: (NOTE) SARS-CoV-2 target nucleic acids are NOT  DETECTED.  The SARS-CoV-2 RNA is generally detectable in upper respiratory specimens during the acute phase of infection. The lowest concentration of SARS-CoV-2 viral copies this assay can detect is 138 copies/mL. A negative result does not preclude SARS-Cov-2 infection and should not be used as the sole basis for treatment or other patient management decisions. A negative result may occur with  improper specimen collection/handling, submission of specimen other than nasopharyngeal swab, presence of viral mutation(s) within the areas targeted by this assay, and inadequate number of viral copies(<138 copies/mL). A negative result must be combined with clinical observations, patient history, and epidemiological information. The expected result is Negative.  Fact Sheet for Patients:  EntrepreneurPulse.com.au  Fact Sheet for Healthcare Providers:  IncredibleEmployment.be  This test is no t yet approved or cleared by the [place] FDA and  has been authorized for detection and/or diagnosis of SARS-CoV-2 by FDA under an Emergency Use Authorization (EUA). This EUA will remain  in effect (meaning this test can be used) for the duration of the COVID-19 declaration under Section 564(b)(1) of the Act, 21 U.S.C.section 360bbb-3(b)(1), unless the authorization is terminated  or revoked sooner.       Influenza A by PCR NEGATIVE NEGATIVE Final   Influenza B by PCR NEGATIVE NEGATIVE Final    Comment: (NOTE) The Xpert Xpress SARS-CoV-2/FLU/RSV plus assay is intended as an aid in the diagnosis of influenza from Nasopharyngeal swab specimens and should not be used as a sole basis for treatment. Nasal washings and aspirates are unacceptable for Xpert Xpress SARS-CoV-2/FLU/RSV testing.  Fact Sheet for Patients: EntrepreneurPulse.com.au  Fact Sheet for Healthcare Providers: IncredibleEmployment.be  This test is not yet  approved or cleared by the [place] FDA and has been authorized for detection and/or diagnosis of SARS-CoV-2 by FDA under an Emergency Use Authorization (EUA). This EUA will remain in effect (meaning this test can be used) for the duration of the COVID-19 declaration under Section 564(b)(1) of the Act, 21 U.S.C. section 360bbb-3(b)(1), unless the authorization is terminated or revoked.  Performed at [facility], [place] [address], [place], [place] [address]   Urine Culture     Status: Abnormal   Collection Time: 09/03/21  7:05 PM   Specimen: In/Out Cath Urine  Result Value Ref Range Status   Specimen Description IN/OUT CATH URINE  Final   Special Requests   Final    NONE Performed at [facility], [place] [address] [place], [place] [address]    Culture >=100,000 COLONIES/mL ESCHERICHIA COLI (A)  Final  Report Status 09/06/2021 FINAL  Final   Organism ID, Bacteria ESCHERICHIA COLI (A)  Final      Susceptibility   Escherichia coli - MIC*    AMPICILLIN >=32 RESISTANT Resistant     CEFAZOLIN 8 SENSITIVE Sensitive     CEFEPIME <=0.12 SENSITIVE Sensitive     CEFTRIAXONE <=0.25 SENSITIVE Sensitive     CIPROFLOXACIN <=0.25 SENSITIVE Sensitive     GENTAMICIN <=1 SENSITIVE Sensitive     IMIPENEM <=0.25 SENSITIVE Sensitive     NITROFURANTOIN <=16 SENSITIVE Sensitive     TRIMETH/SULFA <=20 SENSITIVE Sensitive     AMPICILLIN/SULBACTAM 16 INTERMEDIATE Intermediate     PIP/TAZO <=4 SENSITIVE Sensitive     * >=100,000 COLONIES/mL ESCHERICHIA COLI   *Note: Due to a large number of results and/or encounters for the requested time period, some results have not been displayed. A complete set of results can be found in Results Review.    Labs: CBC: Recent Labs  Lab 09/07/21 1548 09/08/21 0243 09/09/21 0430 09/10/21 0224 09/11/21 0140  WBC 9.3 10.2 13.0* 12.9* 14.8*  HGB 10.1* 9.7* 10.2* 10.1* 9.2*  HCT 32.2* 31.0* 32.4* 31.4* 29.1*  MCV 92.8 93.1 91.3 91.5 91.5  PLT 285  277 324 301 694   Basic Metabolic Panel: Recent Labs  Lab 09/07/21 0449 09/08/21 0243 09/09/21 0430 09/10/21 0224 09/11/21 0140  NA 141 141 138 144 140  K 4.0 3.6 3.3* 3.0* 3.2*  CL 106 102 98 102 102  CO2 '27 30 31 31 30  ' GLUCOSE 91 103* 84 89 126*  BUN 25* '21 18 12 12  ' CREATININE 1.34* 1.42* 1.14* 1.04* 0.97  CALCIUM 8.0* 8.2* 8.7* 8.5* 8.4*   Liver Function Tests: Recent Labs  Lab 09/05/21 0325  AST 22  ALT 41  ALKPHOS 125  BILITOT 0.4  PROT 4.8*  ALBUMIN 2.2*    Discharge time spent: greater than 30 minutes.  Signed: Barton Dubois, MD [facility] 09/11/2021

## 2021-09-11 NOTE — Progress Notes (Signed)
Occupational Therapy Treatment Patient Details Name: Betty Jackson MRN: [id number] DOB: [birth year] Today's Date: 09/11/2021   History of present illness 87 y/o female presented to ED  from SNF on 09/03/21 for increasing jaw pain after tooth extraction 04/2021. CT negative for abscess. Admitted for sespsis 2/2 UTI. PMH: first degree AV block, HFpEF, RA, HTN, Afib, CAD   OT comments  Pt currently min assist for transfers to the bed from the recliner.  Setup for grooming tasks in sitting.  Discussed need for splinting to assist with current bilateral ulnar deviation in her fingers.  Provided visual reference for pre fab splints that can be ordered by OT at [facility] when she returns since discharge is planned later today.  Will continue to follow for acute care if discharge is changed.     Recommendations for follow up therapy are one component of a multi-disciplinary discharge planning process, led by the attending physician.  Recommendations may be updated based on patient status, additional functional criteria and insurance authorization.    Follow Up Recommendations  Skilled nursing-short term rehab (<3 hours/day)    Assistance Recommended at Discharge Frequent or constant Supervision/Assistance  Patient can return home with the following  A little help with walking and/or transfers;A little help with bathing/dressing/bathroom;Assistance with cooking/housework;Assist for transportation;Help with stairs or ramp for entrance;Direct supervision/assist for medications management   Equipment Recommendations  None recommended by OT       Precautions / Restrictions Precautions Precautions: Fall Restrictions Weight Bearing Restrictions: No       Mobility Bed Mobility Overal bed mobility: Needs Assistance Bed Mobility: Sit to Supine       Sit to supine: Min assist        Transfers Overall transfer level: Needs assistance Equipment used: Rolling walker (2 wheels) Transfers:  Sit to/from Stand, Bed to chair/wheelchair/BSC Sit to Stand: Min assist     Step pivot transfers: Min assist     General transfer comment: Min assist for return to the bed from the recliner     Balance Overall balance assessment: Needs assistance Sitting-balance support: No upper extremity supported, Feet supported Sitting balance-[name]: Good     Standing balance support: Reliant on assistive device for balance, During functional activity Standing balance-[name]: Poor Standing balance comment: Pt needs BUE support on the RW with transfers.                           ADL either performed or assessed with clinical judgement   ADL Overall ADL's : Needs assistance/impaired     Grooming: Oral care;Set up;Sitting                   Toilet Transfer: Minimal assistance;Stand-pivot;Rolling walker (2 wheels) Toilet Transfer Details (indicate cue type and reason): stand pivot with use of the RW for support simulted.  Pt declined need to toilet secondary to having the [place].         Functional mobility during ADLs: Minimal assistance;Rolling walker (2 wheels) General ADL Comments: Pt with moderate ulnar deviation in both hands.  Would benefit from splinting for day time functional use as well as night time positioning.  Provided handouts for reference to show OT at SNF since she is discharging today.               Cognition Arousal/Alertness: Awake/alert Behavior During Therapy: WFL for tasks assessed/performed Overall Cognitive Status: Within Functional Limits for tasks assessed  Pertinent Vitals/ Pain       Pain Assessment Pain Assessment: Faces Pain Score: 0-No pain         Frequency  Min 2X/week        Progress Toward Goals  OT Goals(current goals can now be found in the care plan section)  Progress towards OT goals: Progressing toward goals  Acute Rehab  OT Goals Patient Stated Goal: To go back to [facility] OT Goal Formulation: With patient Time For Goal Achievement: 09/20/21 Potential to Achieve Goals: Good  Plan Discharge plan remains appropriate       AM-PAC OT "6 Clicks" Daily Activity     Outcome Measure   Help from another person eating meals?: None Help from another person taking care of personal grooming?: A Little Help from another person toileting, which includes using toliet, bedpan, or urinal?: A Little Help from another person bathing (including washing, rinsing, drying)?: A Little Help from another person to put on and taking off regular upper body clothing?: A Little Help from another person to put on and taking off regular lower body clothing?: A Little 6 Click Score: 19    End of Session Equipment Utilized During Treatment: Gait belt;Rolling walker (2 wheels)  OT Visit Diagnosis: Unsteadiness on feet (R26.81);Other abnormalities of gait and mobility (R26.89);Muscle weakness (generalized) (M62.81)   Activity Tolerance Patient tolerated treatment well   Patient Left in bed;with call bell/phone within reach;with bed alarm set   Nurse Communication Mobility status        Time: 0932-1000 OT Time Calculation (min): 28 min  Charges: OT General Charges $OT Visit: 1 Visit OT Treatments $Self Care/Home Management : 23-37 mins  Betty Jackson OTR/L 09/11/2021, 10:56 AM

## 2021-09-11 NOTE — Progress Notes (Signed)
Report called to Magazine features editor at [facility]. Patients family will be transporting patient back to facility. Will send discharge instructions with family for receiving RN.

## 2021-09-12 LAB — SURGICAL PATHOLOGY

## 2021-09-14 ENCOUNTER — Encounter (HOSPITAL_COMMUNITY): Payer: Self-pay | Admitting: Internal Medicine

## 2021-09-14 ENCOUNTER — Encounter: Payer: Self-pay | Admitting: Internal Medicine

## 2021-09-15 ENCOUNTER — Telehealth: Payer: Self-pay

## 2021-09-15 NOTE — Telephone Encounter (Signed)
Pathology letter created by Dr. Carlean Purl sent to pt via my chart and via mail:

## 2021-10-13 ENCOUNTER — Ambulatory Visit (HOSPITAL_COMMUNITY): Admit: 2021-10-13 | Payer: Medicare Other | Admitting: Gastroenterology

## 2021-10-13 ENCOUNTER — Encounter (HOSPITAL_COMMUNITY): Payer: Self-pay

## 2021-10-13 SURGERY — ESOPHAGOGASTRODUODENOSCOPY (EGD) WITH PROPOFOL
Anesthesia: Monitor Anesthesia Care

## 2021-11-12 ENCOUNTER — Ambulatory Visit (INDEPENDENT_AMBULATORY_CARE_PROVIDER_SITE_OTHER): Payer: Medicare Other | Admitting: Family

## 2021-11-12 ENCOUNTER — Encounter (HOSPITAL_BASED_OUTPATIENT_CLINIC_OR_DEPARTMENT_OTHER): Payer: Self-pay | Admitting: Family

## 2021-11-12 VITALS — BP 128/72 | HR 80 | Ht 64.5 in | Wt 166.0 lb

## 2021-11-12 DIAGNOSIS — I480 Paroxysmal atrial fibrillation: Secondary | ICD-10-CM | POA: Diagnosis not present

## 2021-11-12 DIAGNOSIS — Z5181 Encounter for therapeutic drug level monitoring: Secondary | ICD-10-CM

## 2021-11-12 DIAGNOSIS — I25118 Atherosclerotic heart disease of native coronary artery with other forms of angina pectoris: Secondary | ICD-10-CM | POA: Diagnosis not present

## 2021-11-12 DIAGNOSIS — I5032 Chronic diastolic (congestive) heart failure: Secondary | ICD-10-CM

## 2021-11-12 DIAGNOSIS — D6859 Other primary thrombophilia: Secondary | ICD-10-CM | POA: Diagnosis not present

## 2021-11-12 DIAGNOSIS — I10 Essential (primary) hypertension: Secondary | ICD-10-CM

## 2021-11-12 DIAGNOSIS — Z79899 Other long term (current) drug therapy: Secondary | ICD-10-CM

## 2021-11-12 NOTE — Progress Notes (Unsigned)
Office Visit    Patient Name: Betty Jackson Date of Encounter: 11/12/2021  PCP:  Alvester Morin, MD   [place]  Cardiologist:  [name], MD  Advanced Practice Provider:  No care team member to display Electrophysiologist:  None     Chief Complaint    Betty Jackson is a 87 y.o. female with a hx of CAD, hypertension, PAF, CKD 3, CVA, rheumatoid arthritis/giant cell arteritis  presents today for hospital follow up   Past Medical History    Past Medical History:  Diagnosis Date   Acute respiratory failure with hypoxia ([place]) 05/20/2017   Anemia, unspecified 10/28/2012   Anxiety state, unspecified 10/28/2012   CAD (coronary artery disease)    a. Stent RCA in [facility];  b. Cath approx 2009 - nonobs per pt report.   Cataract    immature on the left eye   Chronic insomnia 02/07/2013   Chronic lower back pain    scoliosis   CKD (chronic kidney disease) stage 3, GFR 30-59 ml/min (HCC)    CVA (cerebral infarction) 10/29/2012   DDD (degenerative disc disease)    Diverticulosis    GERD (gastroesophageal reflux disease) 09/01/2010   Hemorrhoids    Herniated nucleus pulposus, L5-S1, right 11/04/2015   Hyperlipidemia    takes Lipitor daily   Hypertension    takes Amlodipine,Losartan,Metoprolol,and HCTZ daily   Incontinence of urine    Insomnia    takes Restoril nightly   Lumbar stenosis 04/25/2013   Major depressive disorder, recurrent episode, moderate ([place]) 07/16/2013   Osteoporosis    PAF (paroxysmal atrial fibrillation) ([place]) 2011   a. lone epidode in 2011 according to notes.   Rheumatoid arthritis ([place])    Scoliosis    Sepsis ([place]) 05/2019   Stroke ([place])    Temporal arteritis ([place]) 2011   a. followed @ [place]; potential flareup 10/28/2012/notes 10/28/2012   Vocal cord dysfunction    "they don't operate properly" (10/28/2012)   Past Surgical History:  Procedure Laterality Date   ABDOMINAL HYSTERECTOMY  ~ 1984    vaginally   BACK SURGERY  7-49yr ago   X Stop   BLADDER SUSPENSION  2001   BREAST BIOPSY Right    CATARACT EXTRACTION W/ INTRAOCULAR LENS IMPLANT Right ~ 08/2012   CHEST TUBE INSERTION Left 03/08/2019   Procedure: Chest Tube Insertion;  Surgeon: VIvin Poot MD;  [facility]  Service: Thoracic;  Laterality: Left;   COLONOSCOPY  01/26/2012   Procedure: COLONOSCOPY;  Surgeon: MLadene Artist MD,FACG;  [facility]  Service: Endoscopy;  Laterality: N/A;  note the EGD is possible   COLONOSCOPY WITH PROPOFOL N/A 09/10/2021   Procedure: COLONOSCOPY WITH PROPOFOL;  Surgeon: GGatha Mayer MD;  [place] [facility]  Service: Gastroenterology;  Laterality: N/A;   CORONARY ANGIOPLASTY WITH STENT PLACEMENT  2006   X 1 stent   EPIDURAL BLOCK INJECTION     ESOPHAGOGASTRODUODENOSCOPY  01/26/2012   Procedure: ESOPHAGOGASTRODUODENOSCOPY (EGD);  Surgeon: MLadene Artist MD,FACG;  [facility];  Service: Endoscopy;  Laterality: N/A;   ESOPHAGOGASTRODUODENOSCOPY (EGD) WITH PROPOFOL N/A 08/05/2021   Procedure: ESOPHAGOGASTRODUODENOSCOPY (EGD) WITH PROPOFOL;  Surgeon: DDoran Stabler MD;  [place] [facility]  Service: Gastroenterology;  Laterality: N/A;   ESOPHAGOGASTRODUODENOSCOPY (EGD) WITH PROPOFOL N/A 09/10/2021   Procedure: ESOPHAGOGASTRODUODENOSCOPY (EGD) WITH PROPOFOL;  Surgeon: GGatha Mayer MD;  [facility]  Service: Gastroenterology;  Laterality: N/A;   FINE NEEDLE ASPIRATION Right  09/26/2019   Procedure: FINE NEEDLE ASPIRATION;  Surgeon: Altamese Redan, MD;  Location: [place];  Service: Orthopedics;  Laterality: Right;   FOREIGN BODY REMOVAL  08/05/2021   Procedure: FOREIGN BODY REMOVAL;  Surgeon: Doran Stabler, MD;  [facility];  Service: Gastroenterology;;   HEMIARTHROPLASTY HIP Right 2012   IR EPIDUROGRAPHY  04/20/2019   LAPAROSCOPIC CHOLECYSTECTOMY  2001   LUMBAR FUSION  03/2013   LUMBAR LAMINECTOMY/DECOMPRESSION MICRODISCECTOMY  Right 11/04/2015   Procedure: Right Lumbar Five-Sacral One Microdiskectomy;  Surgeon: Kristeen Miss, MD;  Location: [place] [facility]O [facility]S;  Service: Neurosurgery;  Laterality: Right;  Right L5-S1 Microdiskectomy   moles removed that required stiches     one on leg and one on face   ORIF FEMUR FRACTURE Right 09/26/2019   Procedure: OPEN REDUCTION INTERNAL FIXATION (ORIF) DISTAL FEMUR FRACTURE;  Surgeon: Altamese [place], MD;  Location: [place];  Service: Orthopedics;  Laterality: Right;   ORIF FEMUR FRACTURE Right 10/17/2019   Procedure: OPEN REDUCTION INTERNAL FIXATION (ORIF) DISTAL FEMUR FRACTURE;  Surgeon: Altamese Parker, MD;  Location: [place];  Service: Orthopedics;  Laterality: Right;   POLYPECTOMY  09/10/2021   Procedure: POLYPECTOMY;  Surgeon: Gatha Mayer, MD;  Location: [place];  Service: Gastroenterology;;   SUBXYPHOID PERICARDIAL WINDOW N/A 03/08/2019   Procedure: SUBXYPHOID PERICARDIAL WINDOW;  Surgeon: Ivin Poot, MD;  Location: [place];  Service: Thoracic;  Laterality: N/A;   TEE WITHOUT CARDIOVERSION N/A 03/08/2019   Procedure: TRANSESOPHAGEAL ECHOCARDIOGRAM (TEE);  Surgeon: Prescott Gum, Collier Salina, MD;  [facility];  Service: Thoracic;  Laterality: N/A;   TEMPORAL ARTERY BIOPSY / LIGATION Bilateral 2011   TONSILLECTOMY AND ADENOIDECTOMY     at age 20   [place]  ~ [place]  ~ 2010   "lower back" (10/28/2012)    Allergies  Allergies  Allergen Reactions   Alendronate Sodium     Suspected cause of severe esophagitis (per EGD 08/05/21)   Dextromethorphan Rash    History of Present Illness    Betty Jackson is a 87 y.o. female with a hx of CAD, hypertension, PAF, CKD 3, CVA, rheumatoid arthritis/giant cell arteritis last seen while hospitalized.  Prior stent placement RCA in [place] in 2005.  Subsequently with moved to [place].  Normal ABI 2012.  Mild carotid disease by Doppler 2014.  Cardiac cath 11/2012 patent RCA stent, mild disease LAD and circumflex, normal  filling pressures.  Echo August 2014 normal LV EF, no significant valvular abnormalities.  Stress Myoview 06/29/2014 ordered due to chest pain with no ischemia.  Admitted 04/2017 with influenza A, pneumonia, sepsis.  Beta-blocker held and she developed atrial fibrillation with RVR as well as PVC and PAC  She converted back to sinus rhythm after several hours.  Echo 04/2017 normal LVEF, grade 2 diastolic dysfunction, mild AI.  She was not discharged on anticoagulation.  She wore cardiac monitor with recurrent atrial for with CHA2DS2-VASc of 4 and was started on Eliquis.  08/2017 stress test ordered due to chest pain with no ischemia.  At visit 6-24/19 she noted increasing lower extremity edema and Lasix was increased and Norvasc stopped.  Admitted 02/2019 with large pericardial effusion and subxiphoid window per Dr. Darcey Nora.  Effusion was culture negative and pathology showing only inflammation.  Admitted 03/2019 with left ischium fracture -no surgery performed.  Troponin elevated in setting of possible sepsis but blood cultures negative.  Echo 03/2019 with normal LVEF, no significant valvular disease.  She  was seen 05/21/2021 by Dr. Angelena Form doing well from a cardiac perspective and no changes were made at that time.  Subsequently admitted 08/06/2021 with pneumonia.  Had atrial fibrillation during admission and converted to normal sinus rhythm on amiodarone.  Recommended for 200 mg twice daily for 7 days then daily.  Beta-blocker stopped due to bradycardia.  Eliquis was continued.  She presents today for follow up with her daughter. Presently at SNF with plans to stay long term. Enjoys listening to books on tape. Notes she has repeat endoscopy upcoming on the 17th. Feels her mobility has decreased since recent admission. Notes worsening edema in her ankles. Taking Lasix '40mg'$  QD at facility but no additional PRN dosing. Her Hb was checked 08/22/21 and was improved from hospital discharge. BP had been high in the  mornings and MD at facility recently increased Diltiazem to '180mg'$  QD. Denies chest pain, palpitations, orthopnea, PND. She has dyspnea with more than usual activity. Does note for the past two days she has had some discomfort in her previous dental extraction site from February and plans to try to see dentist as work in appointment today. She did have home visit from heath insurane Rn who did a scan on her legs and noted diminished blood flow.   Resides at [employer] Studies Reviewed:   The following studies were reviewed today: TTE 05-12-2019  1. Left ventricular ejection fraction, by visual estimation, is 60 to  65%. The left ventricle has normal function. There is mildly increased  left ventricular hypertrophy.   2. Left ventricular diastolic parameters are consistent with Grade II  diastolic dysfunction (pseudonormalization). The mean left atrial pressure  is high.   3. The left ventricle has no regional wall motion abnormalities.   4. Global right ventricle has normal systolic function.The right  ventricular size is normal. No increase in right ventricular wall  thickness.   5. Left atrial size was mildly dilated.   6. Right atrial size was mildly dilated.   7. Mild mitral annular calcification.   8. The mitral valve is normal in structure. Trivial mitral valve  regurgitation.   9. The tricuspid valve is normal in structure.  10. The tricuspid valve is normal in structure. Tricuspid valve  regurgitation is mild-moderate.  11. The aortic valve is normal in structure. Aortic valve regurgitation is  trivial.  12. The pulmonic valve was not well visualized. Pulmonic valve  regurgitation is not visualized.  13. Mildly elevated pulmonary artery systolic pressure.  14. The tricuspid regurgitant velocity is 2.18 m/s, and with an assumed  right atrial pressure of 15 mmHg, the estimated right ventricular systolic  pressure is mildly elevated at 34.0 mmHg.  15. The  inferior vena cava is dilated in size with <50% respiratory  variability, suggesting right atrial pressure of 15 mmHg.  16. There is exaggerated respiratory displacement of the interventricular  septum. This could represent contrictive physiology , but can also be seen  with increased work of breathing (e.g. COPD or asthma exacerbation).  58. Compared to 03/09/2019, left ventricular wall motion and overall  systolic function have improved.     EKG:  EKG is ordered today.  The ekg ordered today demonstrates NSR 80 bpm with no acute ST/T wave changes.   Recent Labs: 08/11/2021: Magnesium 2.2 09/04/2021: B Natriuretic Peptide 109.2; TSH 2.630 09/05/2021: ALT 41 09/11/2021: BUN 12; Creatinine, Ser 0.97; Hemoglobin 9.2; Platelets 290; Potassium 3.2; Sodium 140  Recent Lipid Panel    Component Value  Date/Time   CHOL 171 11/14/2018 1420   CHOL 155 08/18/2017 1241   TRIG 168.0 (H) 11/14/2018 1420   HDL 63.40 11/14/2018 1420   HDL 78 08/18/2017 1241   CHOLHDL 3 11/14/2018 1420   VLDL 33.6 11/14/2018 1420   LDLCALC 74 11/14/2018 1420   LDLCALC 51 08/18/2017 1241    Risk Assessment/Calculations:   CHA2DS2-VASc Score = 8   This indicates a 10.8% annual risk of stroke. The patient's score is based upon: CHF History: 1 HTN History: 1 Diabetes History: 0 Stroke History: 2 Vascular Disease History: 1 Age Score: 2 Gender Score: 1   {This patient has a significant risk of stroke if diagnosed with atrial fibrillation.  Please consider VKA or DOAC agent for anticoagulation if the bleeding risk is acceptable.   You can also use the SmartPhrase .[place] for documentation.   :[id number]}  Home Medications   Current Meds  Medication Sig   acetaminophen (TYLENOL) 500 MG tablet Take 500 mg by mouth every 6 (six) hours as needed for mild pain.   allopurinol (ZYLOPRIM) 100 MG tablet Take 100 mg by mouth daily.   amiodarone (PACERONE) 200 MG tablet Take '200mg'$  twice daily for 5 days, then take  '200mg'$  once daily. (Patient taking differently: Take 200 mg by mouth daily.)   atorvastatin (LIPITOR) 80 MG tablet Take 1 tablet (80 mg total) by mouth daily. (Patient taking differently: Take 80 mg by mouth at bedtime.)   cetirizine (ZYRTEC) 10 MG tablet Take 10 mg by mouth at bedtime.   cholecalciferol (VITAMIN D) 25 MCG tablet Take 2 tablets (2,000 Units total) by mouth 2 (two) times daily.   diltiazem (CARDIZEM CD) 240 MG 24 hr capsule Take 240 mg by mouth daily.   ELIQUIS 5 MG TABS tablet TAKE 1 TABLET BY MOUTH TWICE A DAY (Patient taking differently: Take 5 mg by mouth 2 (two) times daily.)   escitalopram (LEXAPRO) 10 MG tablet TAKE 1 TABLET BY MOUTH EVERY DAY (Patient taking differently: Take 10 mg by mouth daily.)   fentaNYL 37.5 MCG/HR PT72 Place 1 patch onto the skin every 3 (three) days.   ferrous sulfate 325 (65 FE) MG tablet Take 1 tablet (325 mg total) by mouth 2 (two) times daily with a meal.   furosemide (LASIX) 40 MG tablet Take 40 mg by mouth daily.   furosemide (LASIX) 40 MG tablet Take 1 tablet (40 mg total) by mouth daily as needed (extra dose, for weight gain of 2 pounds overnight or 5 pound in 1 week).   HYDROcodone-acetaminophen (NORCO) 5-325 MG tablet Take 1 tablet by mouth every 12 (twelve) hours as needed for severe pain.   ipratropium-albuterol (DUONEB) 0.5-2.5 (3) MG/3ML SOLN Take 3 mLs by nebulization every 4 (four) hours as needed. (Patient taking differently: Take 3 mLs by nebulization every 4 (four) hours as needed (wheezing/shortness of breath).)   Multiple Vitamin (MULTIVITAMIN WITH MINERALS) TABS tablet Take 1 tablet by mouth daily.   Nutritional Supplement LIQD Take 120 mLs by mouth daily. House stock supplement   ondansetron (ZOFRAN) 4 MG tablet Take 4 mg by mouth every 8 (eight) hours as needed for nausea or vomiting.   pantoprazole (PROTONIX) 40 MG tablet Take 1 tablet (40 mg total) by mouth 2 (two) times daily.   polyethylene glycol (MIRALAX) 17 g packet Take  17 g by mouth daily.   potassium chloride (KLOR-CON M) 10 MEQ tablet Take 3 tablets (30 mEq total) by mouth daily.   Pramox-PE-Glycerin-Petrolatum (HEMORRHOIDAL MAX STR)  1-0.25-14.4-15 % CREA Apply 1 Application topically 2 (two) times daily as needed (hemorrhoidal pain/discomfort.).   predniSONE (DELTASONE) 10 MG tablet Take 1 tablet (10 mg total) by mouth daily with breakfast. Resume after 3 days of '20mg'$  daily as instructed.   sulfaSALAzine (AZULFIDINE) 500 MG EC tablet Take 500 mg by mouth daily.     Review of Systems      All other systems reviewed and are otherwise negative except as noted above.  Physical Exam    VS:  BP 128/72   Pulse 80   Ht 5' 4.5" (1.638 m)   Wt 166 lb (75.3 kg)   BMI 28.05 kg/m  , BMI Body mass index is 28.05 kg/m.  Wt Readings from Last 3 Encounters:  11/12/21 166 lb (75.3 kg)  09/04/21 182 lb 5.1 oz (82.7 kg)  09/02/21 172 lb 1.6 oz (78.1 kg)     GEN: Well nourished, well developed, in no acute distress. HEENT: normal. Neck: Supple, no JVD, carotid bruits, or masses. Cardiac: RRR, no murmurs, rubs, or gallops. No clubbing, cyanosis. Nonpitting pretibial edema bilaterally. Radials 2+ and equal bilaterally. PT 1+ and equal bilaterally Respiratory:  Respirations regular and unlabored, clear to auscultation bilaterally. GI: Soft, nontender, nondistended. MS: No deformity or atrophy. Skin: Warm and dry, no rash. Neuro:  Strength and sensation are intact. Psych: Normal affect.  Assessment & Plan     PAF / hypercoagulable state /sinus bradycardia / On amiodarone therapy - Maintaining NSR. Denies palpitations. Continue Amiodarone '200mg'$  QD, Diltaizem '180mg'$  QD. BB discontinued during admission due to bradycardia. Tolerating Eliquis '5mg'$  BID without bleeding complications. Does not meet dose reduction criteria based on BMP 08/11/21 with creatinine 1.00.  Amiodarone monitoring: Due for LFT/TSH in 3 weeks as will have completed 6 weeks of therapy. Asked SNF to  collect LFT/TSH in 3 weeks.   Hypertension - BP well controlled. Continue current antihypertensive regimen.   ?Claudication - Insurance RN visit about 4 weeks ago with reported reduce blood flow to her bilateral legs. She is somewhat sedentary so difficult to ascertain if she has significant pain with mobility. She does have bilateral 1+ PT. Will order ABI for further evaluation.  CAD/HLD, LDL goal less than 70-Taxus DES in RCA placed in 2005 in [place].  Cath count abdomen 2014 with patent RCA stent, mild LAD and circumflex disease.  Stress test June 2019 without ischemia. Stable with no anginal symptoms. No indication for ischemic evaluation.  GDMT includes atorvastatin. No aspirin due to [facility] and no BB due to prior bradycardia. Heart healthy diet and regular cardiovascular exercise encouraged.    Chronic diastolic heart failure - Nonpitting LE edema. Increase Lasix to '80mg'$  daily x 2 days then return to '40mg'$  QD. Low sodium diet, fluid restriction <2L, and daily weights encouraged. Educated to contact our office for weight gain of 2 lbs overnight or 5 lbs in one week. I have asked SNF to weigh her daily and provide an additional '40mg'$  of Lasix PRN for weight gain of 2 pounds overnight or 5 pounds in one week.   Disposition: Follow up in 2 month(s) with [name], MD or APP.  Signed, Loel Dubonnet, NP 09/02/2021, 5:07 PM [place]

## 2021-11-12 NOTE — Patient Instructions (Signed)
Medication Instructions:  Your Physician recommend you continue on your current medication as directed.    *If you need a refill on your cardiac medications before your next appointment, please call your pharmacy*  Follow-Up: At [facility], you and your health needs are our priority.  As part of our continuing mission to provide you with exceptional heart care, we have created designated Provider Care Teams.  These Care Teams include your primary Cardiologist (physician) and Advanced Practice Providers (APPs -  Physician Assistants and Nurse Practitioners) who all work together to provide you with the care you need, when you need it.  We recommend signing up for the patient portal called "MyChart".  Sign up information is provided on this After Visit Summary.  MyChart is used to connect with patients for Virtual Visits (Telemedicine).  Patients are able to view lab/test results, encounter notes, upcoming appointments, etc.  Non-urgent messages can be sent to your provider as well.   To learn more about what you can do with MyChart, go to [email or web address].    Your next appointment:   Follow up as scheduled   Other Instructions Heart Healthy Diet Recommendations: A low-salt diet is recommended. Meats should be grilled, baked, or boiled. Avoid fried foods. Focus on lean protein sources like fish or chicken with vegetables and fruits. The American Heart Association is a Microbiologist!  American Heart Association Diet and Lifeystyle Recommendations   Exercise recommendations: The American Heart Association recommends 150 minutes of moderate intensity exercise weekly. Try 30 minutes of moderate intensity exercise 4-5 times per week. This could include walking, jogging, or swimming.   Important Information About Sugar

## 2021-11-15 ENCOUNTER — Encounter (HOSPITAL_BASED_OUTPATIENT_CLINIC_OR_DEPARTMENT_OTHER): Payer: Self-pay | Admitting: Family

## 2022-02-25 ENCOUNTER — Encounter: Payer: Self-pay | Admitting: Cardiovascular Disease

## 2022-02-25 ENCOUNTER — Ambulatory Visit (INDEPENDENT_AMBULATORY_CARE_PROVIDER_SITE_OTHER): Payer: Medicare Other

## 2022-02-25 ENCOUNTER — Ambulatory Visit: Payer: Medicare Other | Attending: Cardiovascular Disease | Admitting: Cardiovascular Disease

## 2022-02-25 VITALS — BP 120/60 | HR 60 | Ht 64.0 in | Wt 172.4 lb

## 2022-02-25 DIAGNOSIS — I25118 Atherosclerotic heart disease of native coronary artery with other forms of angina pectoris: Secondary | ICD-10-CM | POA: Diagnosis not present

## 2022-02-25 DIAGNOSIS — R001 Bradycardia, unspecified: Secondary | ICD-10-CM

## 2022-02-25 DIAGNOSIS — I5032 Chronic diastolic (congestive) heart failure: Secondary | ICD-10-CM

## 2022-02-25 DIAGNOSIS — E785 Hyperlipidemia, unspecified: Secondary | ICD-10-CM

## 2022-02-25 DIAGNOSIS — I480 Paroxysmal atrial fibrillation: Secondary | ICD-10-CM

## 2022-02-25 MED ORDER — NITROGLYCERIN 0.4 MG SL SUBL: 0.4000 mg | SUBLINGUAL_TABLET | SUBLINGUAL | 3 refills | Status: AC | PRN

## 2022-02-25 NOTE — Patient Instructions (Signed)
Medication Instructions:  No changes *If you need a refill on your cardiac medications before your next appointment, please call your pharmacy*   Lab Work: none   Testing/Procedures: 3 day heart monitor (ZIO)   Follow-Up: At [facility], you and your health needs are our priority.  As part of our continuing mission to provide you with exceptional heart care, we have created designated Provider Care Teams.  These Care Teams include your primary Cardiologist (physician) and Advanced Practice Providers (APPs -  Physician Assistants and Nurse Practitioners) who all work together to provide you with the care you need, when you need it.   Your next appointment:   6 month(s)  The format for your next appointment:   In Person  Provider:   [name], MD

## 2022-02-25 NOTE — Progress Notes (Signed)
Chief Complaint  Patient presents with   Follow-up    CAD   History of Present Illness: 87 yo female with history of chronic diastolic CHF, paroxysmal atrial fibrillation, PVCs, PACs, CAD s/p remote stent RCA, HTN, HLD, GERD, temporal arteritis, pericardial effusion s/p pericardial window here today for cardiac follow up. She had a stent placed in the RCA in [place], [place] in 2005. She moved to [facility] for treatment of her temporal arteritis at [facility] and to be near family. She had normal ABI in 2012. Mild carotid disease by dopplers 2014. Cardiac cath 12/09/12 with patent RCA stent, mild disease LAD and Circumflex, normal filling pressures. Echo August 2014 with normal LV size and function, no significant valve issues. She was seen in our office 06/19/14 with c/o chest pain. Stress myoview 06/29/14 with no ischemia. She was admitted to [facility] in February 2019 with influenza A, pneumonia and sepsis. Her beta blocker was held and she developed atrial fibrillation with RVR. She also had frequent PVCs and PACs. She converted back to sinus after several hours. Echo February 2019 with normal LV size and function, grade 2 diastolic dysfunction, mild AI. She was not discharged on anticoagulation. She wore a cardiac monitor and had recurrent atrial fib. CHADS VASC score 4. She was seen by Estella Husk, PA 07/06/17 and she was started on Eliquis. I saw her in May 2019 and she c/o chest pain with exertion. Nuclear stress test 08/31/17 with no ischemia. She was having minor nose bleeds on Eliquis. She was referred to ENT but she did not go to that appointment. I saw her 09/20/17 and she c/o weight gain and worsened LE edema. Lasix was increased and Norvasc was stopped. She was admitted to [facility] in December 2020 with a large pericardial effusion and had a sub-xiphoid window per Dr. Prescott Gum. The effusion was culture negative and pathology only showed inflammation. She was admitted to [facility] in January 2021 with a left ischium fracture.  No surgery was performed. Troponin was elevated in the setting of possible sepsis but blood cultures were negative. Cardiology was consulted. Echo January 2021 with normal LV systolic function, no significant valve disease. She was admitted to [facility] in May 2023 with pneumonia and had atrial fib with RVR during the admission. She was then admitted with sepsis in June 2023 and had GI bleeding due to severe esophagitis and gastritis. She was able to resume her Eliquis before discharge.   She is here today for follow up. The patient denies any chest pain, dyspnea, palpitations, lower extremity edema, orthopnea, PND, dizziness, near syncope or syncope. She is living in a SNF. Heart rates have been low at night into the 40s. She is confined to a wheelchair. She no longer walks.   Primary Care Physician: Alvester Morin, MD  Past Medical History:  Diagnosis Date   Acute respiratory failure with hypoxia ([place]) 05/20/2017   Anemia, unspecified 10/28/2012   Anxiety state, unspecified 10/28/2012   CAD (coronary artery disease)    a. Stent RCA in [facility];  b. Cath approx 2009 - nonobs per pt report.   Cataract    immature on the left eye   Chronic insomnia 02/07/2013   Chronic lower back pain    scoliosis   CKD (chronic kidney disease) stage 3, GFR 30-59 ml/min (HCC)    CVA (cerebral infarction) 10/29/2012   DDD (degenerative disc disease)    Diverticulosis    GERD (gastroesophageal reflux disease) 09/01/2010   Hemorrhoids  Herniated nucleus pulposus, L5-S1, right 11/04/2015   Hyperlipidemia    takes Lipitor daily   Hypertension    takes Amlodipine,Losartan,Metoprolol,and HCTZ daily   Incontinence of urine    Insomnia    takes Restoril nightly   Lumbar stenosis 04/25/2013   Major depressive disorder, recurrent episode, moderate ([place]) 07/16/2013   Osteoporosis    PAF (paroxysmal atrial fibrillation) ([place]) 2011   a. lone epidode in 2011 according to notes.   Rheumatoid  arthritis ([place])    Scoliosis    Sepsis ([place]) 05/2019   Stroke ([place])    Temporal arteritis ([place]) 2011   a. followed @ [facility]; potential flareup 10/28/2012/notes 10/28/2012   Vocal cord dysfunction    "they don't operate properly" (10/28/2012)    Past Surgical History:  Procedure Laterality Date   ABDOMINAL HYSTERECTOMY  ~ 1984   vaginally   BACK SURGERY  7-102yr ago   X Stop   BLADDER SUSPENSION  2001   BREAST BIOPSY Right    CATARACT EXTRACTION W/ INTRAOCULAR LENS IMPLANT Right ~ 08/2012   CHEST TUBE INSERTION Left 03/08/2019   Procedure: Chest Tube Insertion;  Surgeon: VIvin Poot MD;  [place] [facility]  Service: Thoracic;  Laterality: Left;   COLONOSCOPY  01/26/2012   Procedure: COLONOSCOPY;  Surgeon: MLadene Artist MD,FACG;  [facility]  Service: Endoscopy;  Laterality: N/A;  note the EGD is possible   COLONOSCOPY WITH PROPOFOL N/A 09/10/2021   Procedure: COLONOSCOPY WITH PROPOFOL;  Surgeon: GGatha Mayer MD;  [place] [facility]  Service: Gastroenterology;  Laterality: N/A;   CORONARY ANGIOPLASTY WITH STENT PLACEMENT  2006   X 1 stent   EPIDURAL BLOCK INJECTION     ESOPHAGOGASTRODUODENOSCOPY  01/26/2012   Procedure: ESOPHAGOGASTRODUODENOSCOPY (EGD);  Surgeon: MLadene Artist MD,FACG;  [facility];  Service: Endoscopy;  Laterality: N/A;   ESOPHAGOGASTRODUODENOSCOPY (EGD) WITH PROPOFOL N/A 08/05/2021   Procedure: ESOPHAGOGASTRODUODENOSCOPY (EGD) WITH PROPOFOL;  Surgeon: DDoran Stabler MD;  [place] [facility]  Service: Gastroenterology;  Laterality: N/A;   ESOPHAGOGASTRODUODENOSCOPY (EGD) WITH PROPOFOL N/A 09/10/2021   Procedure: ESOPHAGOGASTRODUODENOSCOPY (EGD) WITH PROPOFOL;  Surgeon: GGatha Mayer MD;  [place] [facility]  Service: Gastroenterology;  Laterality: N/A;   FINE NEEDLE ASPIRATION Right 09/26/2019   Procedure: FINE NEEDLE ASPIRATION;  Surgeon: HAltamese [place] MD;  [facility]  Service: Orthopedics;  Laterality: Right;    FOREIGN BODY REMOVAL  08/05/2021   Procedure: FOREIGN BODY REMOVAL;  Surgeon: DDoran Stabler MD;  [facility];  Service: Gastroenterology;;   HEMIARTHROPLASTY HIP Right 2012   IR EPIDUROGRAPHY  04/20/2019   LAPAROSCOPIC CHOLECYSTECTOMY  2001   LUMBAR FUSION  03/2013   LUMBAR LAMINECTOMY/DECOMPRESSION MICRODISCECTOMY Right 11/04/2015   Procedure: Right Lumbar Five-Sacral One Microdiskectomy;  Surgeon: HKristeen Miss MD;  [facility];  Service: Neurosurgery;  Laterality: Right;  Right L5-S1 Microdiskectomy   moles removed that required stiches     one on leg and one on face   ORIF FEMUR FRACTURE Right 09/26/2019   Procedure: OPEN REDUCTION INTERNAL FIXATION (ORIF) DISTAL FEMUR FRACTURE;  Surgeon: HAltamese [place] MD;  [place] [facility]  Service: Orthopedics;  Laterality: Right;   ORIF FEMUR FRACTURE Right 10/17/2019   Procedure: OPEN REDUCTION INTERNAL FIXATION (ORIF) DISTAL FEMUR FRACTURE;  Surgeon: HAltamese [place] MD;  [place] [facility]  Service: Orthopedics;  Laterality: Right;   POLYPECTOMY  09/10/2021   Procedure: POLYPECTOMY;  Surgeon: GGatha Mayer MD;  [facility]  Service:  Gastroenterology;;   SUBXYPHOID PERICARDIAL WINDOW N/A 03/08/2019   Procedure: SUBXYPHOID PERICARDIAL WINDOW;  Surgeon: Ivin Poot, MD;  Location: [place];  Service: Thoracic;  Laterality: N/A;   TEE WITHOUT CARDIOVERSION N/A 03/08/2019   Procedure: TRANSESOPHAGEAL ECHOCARDIOGRAM (TEE);  Surgeon: Prescott Gum, Collier [place], MD;  Location: [place];  Service: Thoracic;  Laterality: N/A;   TEMPORAL ARTERY BIOPSY / LIGATION Bilateral 2011   TONSILLECTOMY AND ADENOIDECTOMY     at age 89   [place]  ~ [place]  ~ 2010   "lower back" (10/28/2012)    Current Outpatient Medications  Medication Sig Dispense Refill   acetaminophen (TYLENOL) 500 MG tablet Take 500 mg by mouth every 6 (six) hours as needed for mild pain.     allopurinol (ZYLOPRIM) 100 MG tablet Take 100 mg by mouth  daily.     amiodarone (PACERONE) 200 MG tablet Take '200mg'$  twice daily for 5 days, then take '200mg'$  once daily. (Patient taking differently: Take 200 mg by mouth daily.)     atorvastatin (LIPITOR) 80 MG tablet Take 1 tablet (80 mg total) by mouth daily. (Patient taking differently: Take 80 mg by mouth at bedtime.) 30 tablet 3   cetirizine (ZYRTEC) 10 MG tablet Take 10 mg by mouth at bedtime.     cholecalciferol (VITAMIN D) 25 MCG tablet Take 2 tablets (2,000 Units total) by mouth 2 (two) times daily.     diltiazem (CARDIZEM CD) 240 MG 24 hr capsule Take 240 mg by mouth daily.     ELIQUIS 5 MG TABS tablet TAKE 1 TABLET BY MOUTH TWICE A DAY (Patient taking differently: Take 5 mg by mouth 2 (two) times daily.) 60 tablet 5   escitalopram (LEXAPRO) 10 MG tablet TAKE 1 TABLET BY MOUTH EVERY DAY (Patient taking differently: Take 10 mg by mouth daily.) 90 tablet 1   fentaNYL 37.5 MCG/HR PT72 Place 1 patch onto the skin every 3 (three) days. 10 patch 0   ferrous sulfate 325 (65 FE) MG tablet Take 1 tablet (325 mg total) by mouth 2 (two) times daily with a meal. 60 tablet 3   furosemide (LASIX) 40 MG tablet Take 40 mg by mouth daily.     furosemide (LASIX) 40 MG tablet Take 1 tablet (40 mg total) by mouth daily as needed (extra dose, for weight gain of 2 pounds overnight or 5 pound in 1 week).     HYDROcodone-acetaminophen (NORCO) 5-325 MG tablet Take 1 tablet by mouth every 12 (twelve) hours as needed for severe pain. 10 tablet 0   ipratropium-albuterol (DUONEB) 0.5-2.5 (3) MG/3ML SOLN Take 3 mLs by nebulization every 4 (four) hours as needed. (Patient taking differently: Take 3 mLs by nebulization every 4 (four) hours as needed (wheezing/shortness of breath).) 360 mL    Meloxicam 5 MG CAPS Take 5 mg by mouth daily.     Multiple Vitamin (MULTIVITAMIN WITH MINERALS) TABS tablet Take 1 tablet by mouth daily. 60 tablet 0   nitroGLYCERIN (NITROSTAT) 0.4 MG SL tablet Place 1 tablet (0.4 mg total) under the tongue  every 5 (five) minutes as needed for chest pain. 25 tablet 3   Nutritional Supplement LIQD Take 120 mLs by mouth daily. House stock supplement     ondansetron (ZOFRAN) 4 MG tablet Take 4 mg by mouth every 8 (eight) hours as needed for nausea or vomiting.     pantoprazole (PROTONIX) 40 MG tablet Take 1 tablet (40 mg total) by mouth 2 (two) times daily.  polyethylene glycol (MIRALAX) 17 g packet Take 17 g by mouth daily. 28 each 2   potassium chloride (KLOR-CON M) 10 MEQ tablet Take 3 tablets (30 mEq total) by mouth daily.     Pramox-PE-Glycerin-Petrolatum (HEMORRHOIDAL MAX STR) 1-0.25-14.4-15 % CREA Apply 1 Application topically 2 (two) times daily as needed (hemorrhoidal pain/discomfort.). 25.5 g 0   predniSONE (DELTASONE) 10 MG tablet Take 1 tablet (10 mg total) by mouth daily with breakfast. Resume after 3 days of '20mg'$  daily as instructed. 30 tablet 0   sulfaSALAzine (AZULFIDINE) 500 MG EC tablet Take 500 mg by mouth daily.     QUEtiapine (SEROQUEL) 25 MG tablet Take 1 tablet (25 mg total) by mouth at bedtime. 30 tablet 0   No current facility-administered medications for this visit.    Allergies  Allergen Reactions   Alendronate Sodium     Suspected cause of severe esophagitis (per EGD 08/05/21)   Dextromethorphan Rash    Social History   Socioeconomic History   Marital status: Married    Spouse name: Not on file   Number of children: 3   Years of education: Not on file   Highest education level: Not on file  Occupational History   Occupation: Retired Cabin crew  Tobacco Use   Smoking status: Never   Smokeless tobacco: Never  Vaping Use   Vaping Use: Never used  Substance and Sexual Activity   Alcohol use: Yes    Comment: socially   Drug use: No   Sexual activity: Not on file  Other Topics Concern   Not on file  Social History Narrative   Lives at [place] facility, [employer]   Right Handed   Drinks 1-2 cups caffeine daily   Social Determinants of Health    Financial Resource Strain: Low Risk  (07/16/2020)   Overall Financial Resource Strain (CARDIA)    Difficulty of Paying Living Expenses: Not hard at all  Food Insecurity: No Food Insecurity (07/16/2020)   Hunger Vital Sign    Worried About Running Out of Food in the Last Year: Never true    [place] in the Last Year: Never true  Transportation Needs: No Transportation Needs (07/16/2020)   PRAPARE - Hydrologist (Medical): No    Lack of Transportation (Non-Medical): No  Physical Activity: Inactive (07/16/2020)   Exercise Vital Sign    Days of Exercise per Week: 0 days    Minutes of Exercise per Session: 0 min  Stress: Not on file  Social Connections: Unknown (07/16/2020)   Social Connection and Isolation Panel [NHANES]    Frequency of Communication with Friends and Family: More than three times a week    Frequency of Social Gatherings with Friends and Family: More than three times a week    Attends Religious Services: Never    Marine scientist or Organizations: No    Attends Archivist Meetings: Never    Marital Status: Patient refused  Intimate Partner Violence: Not At Risk (07/16/2020)   Humiliation, Afraid, Rape, and Kick questionnaire    Fear of Current or Ex-Partner: No    Emotionally Abused: No    Physically Abused: No    Sexually Abused: No    Family History  Problem Relation Age of Onset   Brain cancer Mother 36   Heart attack Father    Breast cancer Daughter 70   Heart disease Other    Hypertension Other    Arthritis Neg Hx  Colon cancer Neg Hx    Osteoporosis Neg Hx     Review of Systems:  As stated in the HPI and otherwise negative.   BP 120/60   Pulse 60   Ht '5\' 4"'$  (1.626 m)   Wt 172 lb 6.4 oz (78.2 kg)   SpO2 97%   BMI 29.59 kg/m   Physical Examination: General: Well developed, well nourished, NAD  HEENT: OP clear, mucus membranes moist  SKIN: warm, dry. No rashes. Neuro: No focal deficits   Musculoskeletal: Muscle strength 5/5 all ext  Psychiatric: Mood and affect normal  Neck: No JVD, no carotid bruits, no thyromegaly, no lymphadenopathy.  Lungs:Clear bilaterally, no wheezes, rhonci, crackles Cardiovascular: Regular rate and rhythm. No murmurs, gallops or rubs. Abdomen:Soft. Bowel sounds present. Non-tender.  Extremities: No lower extremity edema. Pulses are 2 + in the bilateral DP/PT.  Cardiac cath 12/09/12: Ao: 150/61  LV: 155/13/18  RA: 6  RV: 25/5/8  PA: 24/10 (mean 16)  PCWP: 9  Fick Cardiac Output: 3.9 L/min  Fick Cardiac Index: 2.3 L/min/m2  Central Aortic Saturation: 98%  Pulmonary Artery Saturation: 69%  Angiographic Findings:  Left main: 10% distal stenosis.  Left Anterior Descending Artery: Large caliber vessel that courses to the apex. The proximal and mid vessel has moderate calcification. The mid vessel has a long 20% stenosis. There are two small caliber diagonal branches with mild plaque disease.  Circumflex Artery: Large caliber vessel with two small caliber obtuse marginal branches. No obstructive disease.  Right Coronary Artery: Large caliber, dominant vessel with a patent stent in the mid vessel. There is minimal stent restenosis. Just beyond the stent there is a 20% stenosis.  Left Ventricular Angiogram: LVEF=65%  Impression:  1. Stable single vessel CAD with patent mid RCA stent  2. Normal LV systolic function  3. Normal PA pressures  Echo January 2021:  1. Left ventricular ejection fraction, by visual estimation, is 60 to  65%. The left ventricle has normal function. There is mildly increased  left ventricular hypertrophy.   2. Left ventricular diastolic parameters are consistent with Grade II  diastolic dysfunction (pseudonormalization). The mean left atrial pressure  is high.   3. The left ventricle has no regional wall motion abnormalities.   4. Global right ventricle has normal systolic function.The right  ventricular size is normal.  No increase in right ventricular wall  thickness.   5. Left atrial size was mildly dilated.   6. Right atrial size was mildly dilated.   7. Mild mitral annular calcification.   8. The mitral valve is normal in structure. Trivial mitral valve  regurgitation.   9. The tricuspid valve is normal in structure.  10. The tricuspid valve is normal in structure. Tricuspid valve  regurgitation is mild-moderate.  11. The aortic valve is normal in structure. Aortic valve regurgitation is  trivial.  12. The pulmonic valve was not well visualized. Pulmonic valve  regurgitation is not visualized.  13. Mildly elevated pulmonary artery systolic pressure.  14. The tricuspid regurgitant velocity is 2.18 m/s, and with an assumed  right atrial pressure of 15 mmHg, the estimated right ventricular systolic  pressure is mildly elevated at 34.0 mmHg.  15. The inferior vena cava is dilated in size with <50% respiratory  variability, suggesting right atrial pressure of 15 mmHg.  16. There is exaggerated respiratory displacement of the interventricular  septum. This could represent contrictive physiology , but can also be seen  with increased work of breathing (e.g. COPD or  asthma exacerbation).  54. Compared to 03/09/2019, left ventricular wall motion and overall  systolic function have improved.   EKG:  EKG is not ordered today. The ekg ordered today demonstrates  Recent Labs: 08/11/2021: Magnesium 2.2 09/04/2021: B Natriuretic Peptide 109.2; TSH 2.630 09/05/2021: ALT 41 09/11/2021: BUN 12; Creatinine, Ser 0.97; Hemoglobin 9.2; Platelets 290; Potassium 3.2; Sodium 140   Lipid Panel    Component Value Date/Time   CHOL 171 11/14/2018 1420   CHOL 155 08/18/2017 1241   TRIG 168.0 (H) 11/14/2018 1420   HDL 63.40 11/14/2018 1420   HDL 78 08/18/2017 1241   CHOLHDL 3 11/14/2018 1420   VLDL 33.6 11/14/2018 1420   LDLCALC 74 11/14/2018 1420   LDLCALC 51 08/18/2017 1241     Wt Readings from Last 3 Encounters:   02/25/22 172 lb 6.4 oz (78.2 kg)  11/12/21 166 lb (75.3 kg)  09/04/21 182 lb 5.1 oz (82.7 kg)     Assessment and Plan:   1. CAD with stable angina: She has a Taxus drug eluting stent in the RCA, placed in 2005 in [place]. Cath at [facility] September 2014 with patent RCA stent and mild disease LAD and circumflex. Stress test in June 2019 without ischemia. No chest pain. he has no chest pain concerning for angina. Continue statin. No ASA since she is on Eliquis. Beta blocker stopped due to bradycardia. Will give her SL NTG today to use as needed.   2. HTN: BP is controlled. No changes today  3. Hyperlipidemia: Lipids followed in primary care. Continue statin  4. PAF: Sinus today on exam. Continue Cardizem   5. Chronic diastolic CHF: Weight is stable. No volume overload on exam. Continue Lasix 40 mg daily. OK to take extra Lasix 40 mg as needed for LE edema.   6. Bradycardia: 3 day monitor to exclude heart block and assess heart rate.   Labs/ tests ordered today include:   Orders Placed This Encounter  Procedures   LONG TERM MONITOR (3-14 DAYS)   Disposition:   F/U with me in 12 months  Signed, [name], MD 02/25/2022 1:30 PM    [facility] [address], [place], [place]  [address] Phone: [phone]; Fax: [phone]

## 2022-02-25 NOTE — Progress Notes (Unsigned)
Applied a 3 day Zio XT monitor to patient in the office 

## 2022-05-20 ENCOUNTER — Emergency Department (HOSPITAL_COMMUNITY): Payer: 59

## 2022-05-20 ENCOUNTER — Other Ambulatory Visit: Payer: Self-pay

## 2022-05-20 ENCOUNTER — Encounter (HOSPITAL_COMMUNITY): Payer: Self-pay

## 2022-05-20 ENCOUNTER — Inpatient Hospital Stay (HOSPITAL_COMMUNITY)
Admission: EM | Admit: 2022-05-20 | Discharge: 2022-05-28 | DRG: 871 | Disposition: A | Discharge status: Skilled Nursing Facility | Payer: 59 | Attending: Internal Medicine | Admitting: Internal Medicine

## 2022-05-20 DIAGNOSIS — Z981 Arthrodesis status: Secondary | ICD-10-CM

## 2022-05-20 DIAGNOSIS — J9622 Acute and chronic respiratory failure with hypercapnia: Secondary | ICD-10-CM | POA: Diagnosis present

## 2022-05-20 DIAGNOSIS — G928 Other toxic encephalopathy: Secondary | ICD-10-CM | POA: Diagnosis present

## 2022-05-20 DIAGNOSIS — Z79899 Other long term (current) drug therapy: Secondary | ICD-10-CM

## 2022-05-20 DIAGNOSIS — N179 Acute kidney failure, unspecified: Secondary | ICD-10-CM | POA: Diagnosis not present

## 2022-05-20 DIAGNOSIS — M810 Age-related osteoporosis without current pathological fracture: Secondary | ICD-10-CM | POA: Diagnosis present

## 2022-05-20 DIAGNOSIS — Z96641 Presence of right artificial hip joint: Secondary | ICD-10-CM | POA: Diagnosis present

## 2022-05-20 DIAGNOSIS — R0902 Hypoxemia: Principal | ICD-10-CM

## 2022-05-20 DIAGNOSIS — K2970 Gastritis, unspecified, without bleeding: Secondary | ICD-10-CM | POA: Diagnosis not present

## 2022-05-20 DIAGNOSIS — I509 Heart failure, unspecified: Secondary | ICD-10-CM | POA: Diagnosis not present

## 2022-05-20 DIAGNOSIS — Z8249 Family history of ischemic heart disease and other diseases of the circulatory system: Secondary | ICD-10-CM

## 2022-05-20 DIAGNOSIS — N183 Chronic kidney disease, stage 3 unspecified: Secondary | ICD-10-CM | POA: Diagnosis present

## 2022-05-20 DIAGNOSIS — T40605A Adverse effect of unspecified narcotics, initial encounter: Secondary | ICD-10-CM | POA: Diagnosis present

## 2022-05-20 DIAGNOSIS — Z8673 Personal history of transient ischemic attack (TIA), and cerebral infarction without residual deficits: Secondary | ICD-10-CM

## 2022-05-20 DIAGNOSIS — R6520 Severe sepsis without septic shock: Secondary | ICD-10-CM | POA: Diagnosis present

## 2022-05-20 DIAGNOSIS — K5641 Fecal impaction: Secondary | ICD-10-CM | POA: Diagnosis present

## 2022-05-20 DIAGNOSIS — Z961 Presence of intraocular lens: Secondary | ICD-10-CM | POA: Diagnosis present

## 2022-05-20 DIAGNOSIS — M316 Other giant cell arteritis: Secondary | ICD-10-CM | POA: Diagnosis present

## 2022-05-20 DIAGNOSIS — Z66 Do not resuscitate: Secondary | ICD-10-CM | POA: Diagnosis present

## 2022-05-20 DIAGNOSIS — Z7189 Other specified counseling: Secondary | ICD-10-CM | POA: Diagnosis not present

## 2022-05-20 DIAGNOSIS — E8729 Other acidosis: Secondary | ICD-10-CM | POA: Diagnosis present

## 2022-05-20 DIAGNOSIS — D62 Acute posthemorrhagic anemia: Secondary | ICD-10-CM | POA: Diagnosis not present

## 2022-05-20 DIAGNOSIS — Z7901 Long term (current) use of anticoagulants: Secondary | ICD-10-CM

## 2022-05-20 DIAGNOSIS — M069 Rheumatoid arthritis, unspecified: Secondary | ICD-10-CM | POA: Diagnosis present

## 2022-05-20 DIAGNOSIS — J189 Pneumonia, unspecified organism: Secondary | ICD-10-CM | POA: Diagnosis not present

## 2022-05-20 DIAGNOSIS — F410 Panic disorder [episodic paroxysmal anxiety] without agoraphobia: Secondary | ICD-10-CM | POA: Diagnosis not present

## 2022-05-20 DIAGNOSIS — Z789 Other specified health status: Secondary | ICD-10-CM | POA: Diagnosis not present

## 2022-05-20 DIAGNOSIS — R112 Nausea with vomiting, unspecified: Secondary | ICD-10-CM

## 2022-05-20 DIAGNOSIS — Z79891 Long term (current) use of opiate analgesic: Secondary | ICD-10-CM

## 2022-05-20 DIAGNOSIS — A419 Sepsis, unspecified organism: Principal | ICD-10-CM | POA: Diagnosis present

## 2022-05-20 DIAGNOSIS — R001 Bradycardia, unspecified: Secondary | ICD-10-CM | POA: Diagnosis not present

## 2022-05-20 DIAGNOSIS — K2990 Gastroduodenitis, unspecified, without bleeding: Secondary | ICD-10-CM | POA: Diagnosis present

## 2022-05-20 DIAGNOSIS — E669 Obesity, unspecified: Secondary | ICD-10-CM | POA: Diagnosis present

## 2022-05-20 DIAGNOSIS — Z1152 Encounter for screening for COVID-19: Secondary | ICD-10-CM | POA: Diagnosis not present

## 2022-05-20 DIAGNOSIS — F5104 Psychophysiologic insomnia: Secondary | ICD-10-CM | POA: Diagnosis present

## 2022-05-20 DIAGNOSIS — E785 Hyperlipidemia, unspecified: Secondary | ICD-10-CM | POA: Diagnosis present

## 2022-05-20 DIAGNOSIS — I2510 Atherosclerotic heart disease of native coronary artery without angina pectoris: Secondary | ICD-10-CM | POA: Diagnosis present

## 2022-05-20 DIAGNOSIS — I5032 Chronic diastolic (congestive) heart failure: Secondary | ICD-10-CM | POA: Diagnosis present

## 2022-05-20 DIAGNOSIS — K922 Gastrointestinal hemorrhage, unspecified: Secondary | ICD-10-CM | POA: Diagnosis not present

## 2022-05-20 DIAGNOSIS — Z9049 Acquired absence of other specified parts of digestive tract: Secondary | ICD-10-CM

## 2022-05-20 DIAGNOSIS — Z993 Dependence on wheelchair: Secondary | ICD-10-CM

## 2022-05-20 DIAGNOSIS — I110 Hypertensive heart disease with heart failure: Secondary | ICD-10-CM | POA: Diagnosis not present

## 2022-05-20 DIAGNOSIS — K921 Melena: Secondary | ICD-10-CM | POA: Diagnosis not present

## 2022-05-20 DIAGNOSIS — I130 Hypertensive heart and chronic kidney disease with heart failure and stage 1 through stage 4 chronic kidney disease, or unspecified chronic kidney disease: Secondary | ICD-10-CM | POA: Diagnosis present

## 2022-05-20 DIAGNOSIS — D631 Anemia in chronic kidney disease: Secondary | ICD-10-CM | POA: Diagnosis present

## 2022-05-20 DIAGNOSIS — I480 Paroxysmal atrial fibrillation: Secondary | ICD-10-CM | POA: Diagnosis present

## 2022-05-20 DIAGNOSIS — Z515 Encounter for palliative care: Secondary | ICD-10-CM | POA: Diagnosis not present

## 2022-05-20 DIAGNOSIS — E870 Hyperosmolality and hypernatremia: Secondary | ICD-10-CM | POA: Diagnosis present

## 2022-05-20 DIAGNOSIS — E876 Hypokalemia: Secondary | ICD-10-CM | POA: Diagnosis present

## 2022-05-20 DIAGNOSIS — I959 Hypotension, unspecified: Secondary | ICD-10-CM | POA: Diagnosis not present

## 2022-05-20 DIAGNOSIS — Z9981 Dependence on supplemental oxygen: Secondary | ICD-10-CM

## 2022-05-20 DIAGNOSIS — Z6831 Body mass index (BMI) 31.0-31.9, adult: Secondary | ICD-10-CM

## 2022-05-20 DIAGNOSIS — E860 Dehydration: Secondary | ICD-10-CM | POA: Diagnosis present

## 2022-05-20 DIAGNOSIS — F39 Unspecified mood [affective] disorder: Secondary | ICD-10-CM | POA: Diagnosis present

## 2022-05-20 DIAGNOSIS — Z888 Allergy status to other drugs, medicaments and biological substances status: Secondary | ICD-10-CM

## 2022-05-20 DIAGNOSIS — J9621 Acute and chronic respiratory failure with hypoxia: Secondary | ICD-10-CM | POA: Diagnosis present

## 2022-05-20 DIAGNOSIS — D500 Iron deficiency anemia secondary to blood loss (chronic): Secondary | ICD-10-CM | POA: Diagnosis not present

## 2022-05-20 DIAGNOSIS — Z8616 Personal history of COVID-19: Secondary | ICD-10-CM | POA: Diagnosis not present

## 2022-05-20 DIAGNOSIS — F411 Generalized anxiety disorder: Secondary | ICD-10-CM | POA: Diagnosis present

## 2022-05-20 DIAGNOSIS — Z90710 Acquired absence of both cervix and uterus: Secondary | ICD-10-CM

## 2022-05-20 DIAGNOSIS — Z955 Presence of coronary angioplasty implant and graft: Secondary | ICD-10-CM

## 2022-05-20 DIAGNOSIS — Z7952 Long term (current) use of systemic steroids: Secondary | ICD-10-CM

## 2022-05-20 DIAGNOSIS — M109 Gout, unspecified: Secondary | ICD-10-CM | POA: Diagnosis present

## 2022-05-20 DIAGNOSIS — K449 Diaphragmatic hernia without obstruction or gangrene: Secondary | ICD-10-CM | POA: Diagnosis present

## 2022-05-20 DIAGNOSIS — R4182 Altered mental status, unspecified: Secondary | ICD-10-CM | POA: Diagnosis not present

## 2022-05-20 DIAGNOSIS — M419 Scoliosis, unspecified: Secondary | ICD-10-CM | POA: Diagnosis present

## 2022-05-20 DIAGNOSIS — G894 Chronic pain syndrome: Secondary | ICD-10-CM | POA: Diagnosis present

## 2022-05-20 LAB — I-STAT VENOUS BLOOD GAS, ED
Acid-Base Excess: 1 mmol/L (ref 0.0–2.0)
Acid-Base Excess: 3 mmol/L — ABNORMAL HIGH (ref 0.0–2.0)
Acid-Base Excess: 6 mmol/L — ABNORMAL HIGH (ref 0.0–2.0)
Bicarbonate: 25.7 mmol/L (ref 20.0–28.0)
Bicarbonate: 32.1 mmol/L — ABNORMAL HIGH (ref 20.0–28.0)
Bicarbonate: 33.5 mmol/L — ABNORMAL HIGH (ref 20.0–28.0)
Calcium, Ion: 1.09 mmol/L — ABNORMAL LOW (ref 1.15–1.40)
Calcium, Ion: 1.19 mmol/L (ref 1.15–1.40)
Calcium, Ion: 1.22 mmol/L (ref 1.15–1.40)
HCT: 35 % — ABNORMAL LOW (ref 36.0–46.0)
HCT: 38 % (ref 36.0–46.0)
HCT: 40 % (ref 36.0–46.0)
Hemoglobin: 11.9 g/dL — ABNORMAL LOW (ref 12.0–15.0)
Hemoglobin: 12.9 g/dL (ref 12.0–15.0)
Hemoglobin: 13.6 g/dL (ref 12.0–15.0)
O2 Saturation: 35 %
O2 Saturation: 66 %
O2 Saturation: 94 %
Potassium: 3.6 mmol/L (ref 3.5–5.1)
Potassium: 4.2 mmol/L (ref 3.5–5.1)
Potassium: 4.4 mmol/L (ref 3.5–5.1)
Sodium: 148 mmol/L — ABNORMAL HIGH (ref 135–145)
Sodium: 148 mmol/L — ABNORMAL HIGH (ref 135–145)
Sodium: 149 mmol/L — ABNORMAL HIGH (ref 135–145)
TCO2: 27 mmol/L (ref 22–32)
TCO2: 34 mmol/L — ABNORMAL HIGH (ref 22–32)
TCO2: 35 mmol/L — ABNORMAL HIGH (ref 22–32)
pCO2, Ven: 40 mmHg — ABNORMAL LOW (ref 44–60)
pCO2, Ven: 64.3 mmHg — ABNORMAL HIGH (ref 44–60)
pCO2, Ven: 69.5 mmHg — ABNORMAL HIGH (ref 44–60)
pH, Ven: 7.272 (ref 7.25–7.43)
pH, Ven: 7.324 (ref 7.25–7.43)
pH, Ven: 7.415 (ref 7.25–7.43)
pO2, Ven: 25 mmHg — CL (ref 32–45)
pO2, Ven: 38 mmHg (ref 32–45)
pO2, Ven: 68 mmHg — ABNORMAL HIGH (ref 32–45)

## 2022-05-20 LAB — COMPREHENSIVE METABOLIC PANEL
ALT: 14 U/L (ref 0–44)
AST: 24 U/L (ref 15–41)
Albumin: 3.4 g/dL — ABNORMAL LOW (ref 3.5–5.0)
Alkaline Phosphatase: 81 U/L (ref 38–126)
Anion gap: 12 (ref 5–15)
BUN: 31 mg/dL — ABNORMAL HIGH (ref 8–23)
CO2: 28 mmol/L (ref 22–32)
Calcium: 9.3 mg/dL (ref 8.9–10.3)
Chloride: 107 mmol/L (ref 98–111)
Creatinine, Ser: 1.65 mg/dL — ABNORMAL HIGH (ref 0.44–1.00)
GFR, Estimated: 30 mL/min — ABNORMAL LOW (ref >60)
Glucose, Bld: 123 mg/dL — ABNORMAL HIGH (ref 70–99)
Potassium: 3.7 mmol/L (ref 3.5–5.1)
Sodium: 147 mmol/L — ABNORMAL HIGH (ref 135–145)
Total Bilirubin: 1.4 mg/dL — ABNORMAL HIGH (ref 0.3–1.2)
Total Protein: 6.5 g/dL (ref 6.5–8.1)

## 2022-05-20 LAB — URINALYSIS, ROUTINE W REFLEX MICROSCOPIC
Bilirubin Urine: NEGATIVE
Glucose, UA: NEGATIVE mg/dL
Hgb urine dipstick: NEGATIVE
Ketones, ur: NEGATIVE mg/dL
Leukocytes,Ua: NEGATIVE
Nitrite: NEGATIVE
Protein, ur: NEGATIVE mg/dL
Specific Gravity, Urine: 1.01 (ref 1.005–1.030)
pH: 5 (ref 5.0–8.0)

## 2022-05-20 LAB — CBC
HCT: 41.3 % (ref 36.0–46.0)
HCT: 41.7 % (ref 36.0–46.0)
Hemoglobin: 12.5 g/dL (ref 12.0–15.0)
Hemoglobin: 12.9 g/dL (ref 12.0–15.0)
MCH: 31.4 pg (ref 26.0–34.0)
MCH: 31.9 pg (ref 26.0–34.0)
MCHC: 30.3 g/dL (ref 30.0–36.0)
MCHC: 30.9 g/dL (ref 30.0–36.0)
MCV: 103.8 fL — ABNORMAL HIGH (ref 80.0–100.0)
MCV: 103 fL — ABNORMAL HIGH (ref 80.0–100.0)
Platelets: 202 10*3/uL (ref 150–400)
Platelets: 178 10*3/uL (ref 150–400)
RBC: 3.98 MIL/uL (ref 3.87–5.11)
RBC: 4.05 MIL/uL (ref 3.87–5.11)
RDW: 14.2 % (ref 11.5–15.5)
RDW: 14.5 % (ref 11.5–15.5)
WBC: 12.9 10*3/uL — ABNORMAL HIGH (ref 4.0–10.5)
WBC: 24.7 10*3/uL — ABNORMAL HIGH (ref 4.0–10.5)
nRBC: 0 % (ref 0.0–0.2)
nRBC: 0 % (ref 0.0–0.2)

## 2022-05-20 LAB — I-STAT CHEM 8, ED
BUN: 33 mg/dL — ABNORMAL HIGH (ref 8–23)
Calcium, Ion: 1.18 mmol/L (ref 1.15–1.40)
Chloride: 108 mmol/L (ref 98–111)
Creatinine, Ser: 1.7 mg/dL — ABNORMAL HIGH (ref 0.44–1.00)
Glucose, Bld: 121 mg/dL — ABNORMAL HIGH (ref 70–99)
HCT: 41 % (ref 36.0–46.0)
Hemoglobin: 13.9 g/dL (ref 12.0–15.0)
Potassium: 3.6 mmol/L (ref 3.5–5.1)
Sodium: 150 mmol/L — ABNORMAL HIGH (ref 135–145)
TCO2: 29 mmol/L (ref 22–32)

## 2022-05-20 LAB — TROPONIN I (HIGH SENSITIVITY)
Troponin I (High Sensitivity): 13 ng/L (ref <18)
Troponin I (High Sensitivity): 13 ng/L (ref <18)

## 2022-05-20 LAB — RESP PANEL BY RT-PCR (RSV, FLU A&B, COVID)  RVPGX2
Influenza A by PCR: NEGATIVE
Influenza B by PCR: NEGATIVE
Resp Syncytial Virus by PCR: NEGATIVE
SARS Coronavirus 2 by RT PCR: NEGATIVE

## 2022-05-20 LAB — BRAIN NATRIURETIC PEPTIDE: B Natriuretic Peptide: 122.1 pg/mL — ABNORMAL HIGH (ref 0.0–100.0)

## 2022-05-20 MED ORDER — FLEET ENEMA 7-19 GM/118ML RE ENEM
1.0000 | ENEMA | Freq: Once | RECTAL | Status: DC
  Filled 2022-05-20: qty 1

## 2022-05-20 MED ORDER — SODIUM CHLORIDE 0.9 % IV SOLN: 2.0000 g | INTRAVENOUS | Status: DC

## 2022-05-20 MED ORDER — SODIUM CHLORIDE 0.9 % IV SOLN
2.0000 g | INTRAVENOUS | Status: DC
  Administered 2022-05-21 – 2022-05-25 (×5): 2 g via INTRAVENOUS
  Filled 2022-05-20 (×5): qty 20

## 2022-05-20 MED ORDER — IPRATROPIUM-ALBUTEROL 0.5-2.5 (3) MG/3ML IN SOLN
3.0000 mL | Freq: Four times a day (QID) | RESPIRATORY_TRACT | Status: DC
  Administered 2022-05-20 – 2022-05-21 (×2): 3 mL via RESPIRATORY_TRACT
  Filled 2022-05-20 (×3): qty 3

## 2022-05-20 MED ORDER — DILTIAZEM HCL-DEXTROSE 125-5 MG/125ML-% IV SOLN (PREMIX): 5.0000 mg/h | INTRAVENOUS | Status: DC

## 2022-05-20 MED ORDER — SODIUM CHLORIDE 0.9 % IV BOLUS
250.0000 mL | INTRAVENOUS | Status: AC
  Administered 2022-05-20: 250 mL via INTRAVENOUS

## 2022-05-20 MED ORDER — HEPARIN (PORCINE) 25000 UT/250ML-% IV SOLN
750.0000 [IU]/h | INTRAVENOUS | Status: DC
  Administered 2022-05-20: 1000 [IU]/h via INTRAVENOUS
  Filled 2022-05-20: qty 250

## 2022-05-20 MED ORDER — SODIUM CHLORIDE 0.9 % IV SOLN: 500.0000 mg | INTRAVENOUS | Status: DC

## 2022-05-20 MED ORDER — LACTATED RINGERS IV SOLN: INTRAVENOUS | Status: DC

## 2022-05-20 MED ORDER — LACTATED RINGERS IV BOLUS
500.0000 mL | Freq: Once | INTRAVENOUS | Status: AC
  Administered 2022-05-20: 500 mL via INTRAVENOUS

## 2022-05-20 MED ORDER — PANTOPRAZOLE SODIUM 40 MG IV SOLR
40.0000 mg | Freq: Two times a day (BID) | INTRAVENOUS | Status: DC
  Administered 2022-05-20 – 2022-05-21 (×3): 40 mg via INTRAVENOUS
  Filled 2022-05-20 (×3): qty 10

## 2022-05-20 MED ORDER — HEPARIN SODIUM (PORCINE) 5000 UNIT/ML IJ SOLN
5000.0000 [IU] | Freq: Three times a day (TID) | INTRAMUSCULAR | Status: DC
  Filled 2022-05-20: qty 1

## 2022-05-20 MED ORDER — ACETAMINOPHEN 650 MG RE SUPP
650.0000 mg | Freq: Four times a day (QID) | RECTAL | Status: DC | PRN
  Administered 2022-05-20: 650 mg via RECTAL
  Filled 2022-05-20: qty 1

## 2022-05-20 MED ORDER — ALBUTEROL SULFATE (2.5 MG/3ML) 0.083% IN NEBU
2.5000 mg | INHALATION_SOLUTION | RESPIRATORY_TRACT | Status: DC | PRN
  Administered 2022-05-25 – 2022-05-26 (×3): 2.5 mg via RESPIRATORY_TRACT
  Filled 2022-05-20 (×3): qty 3

## 2022-05-20 MED ORDER — ONDANSETRON HCL 4 MG/2ML IJ SOLN
4.0000 mg | Freq: Once | INTRAMUSCULAR | Status: AC
  Administered 2022-05-20: 4 mg via INTRAVENOUS
  Filled 2022-05-20: qty 2

## 2022-05-20 MED ORDER — SODIUM CHLORIDE 0.9 % IV SOLN
500.0000 mg | Freq: Once | INTRAVENOUS | Status: AC
  Administered 2022-05-20: 500 mg via INTRAVENOUS
  Filled 2022-05-20: qty 5

## 2022-05-20 MED ORDER — FENTANYL 12 MCG/HR TD PT72: 1.0000 | MEDICATED_PATCH | TRANSDERMAL | Status: DC

## 2022-05-20 MED ORDER — HYDROCORTISONE (PERIANAL) 2.5 % EX CREA: 1.0000 | TOPICAL_CREAM | Freq: Two times a day (BID) | CUTANEOUS | Status: DC | PRN

## 2022-05-20 MED ORDER — SODIUM CHLORIDE 0.9 % IV SOLN
2.0000 g | Freq: Once | INTRAVENOUS | Status: AC
  Administered 2022-05-20: 2 g via INTRAVENOUS
  Filled 2022-05-20: qty 20

## 2022-05-20 MED ORDER — METHYLPREDNISOLONE SODIUM SUCC 40 MG IJ SOLR
40.0000 mg | Freq: Two times a day (BID) | INTRAMUSCULAR | Status: DC
  Administered 2022-05-20 – 2022-05-21 (×3): 40 mg via INTRAVENOUS
  Filled 2022-05-20 (×4): qty 1

## 2022-05-20 MED ORDER — ACETAMINOPHEN 325 MG PO TABS
650.0000 mg | ORAL_TABLET | Freq: Four times a day (QID) | ORAL | Status: DC | PRN
  Administered 2022-05-26 – 2022-05-27 (×3): 650 mg via ORAL
  Filled 2022-05-20 (×3): qty 2

## 2022-05-20 MED ORDER — SODIUM CHLORIDE 0.9 % IV SOLN
500.0000 mg | INTRAVENOUS | Status: AC
  Administered 2022-05-21 – 2022-05-25 (×5): 500 mg via INTRAVENOUS
  Filled 2022-05-20 (×5): qty 5

## 2022-05-20 NOTE — ED Notes (Signed)
Pt report received from previous nurse. Pt resting, rise/fall of chest noted. Vitals stable, denies needs/complaints. Family at bedside. Call bell in reach. No acute distress noted.

## 2022-05-20 NOTE — ED Triage Notes (Signed)
EMS called out for n/v chest pain.  Patient has chronic pain.  Patient is suppose ot be on oxygen at all times 2L [place] but was not so initial oxygen was 88% placed on 4L [place]  Facility reports she is altered and slow to respond but last seen normal was yesterday between 730a-3p.  Took her norco and zofran this am.  Also has fentanyl patch on left shoulder.  CBG 130 HR 75 BP 140/70 96% 4L [place] and received albuterol 26m.

## 2022-05-20 NOTE — ED Notes (Signed)
Pt HR increasing, spoke with hospitalist about status of pt, awaiting orders.

## 2022-05-20 NOTE — ED Notes (Signed)
Hospitalist states to hold Cardizem drip due to decreased BP. Pt is alert to voice, not oriented and nonverbal, this is unchanged from when pt got here.

## 2022-05-20 NOTE — ED Notes (Signed)
Respiratory at bedside starting BIPAP

## 2022-05-20 NOTE — Progress Notes (Signed)
ANTICOAGULATION CONSULT NOTE - Initial Consult  Pharmacy Consult for heparin Indication: atrial fibrillation  Allergies  Allergen Reactions   Alendronate Sodium     Suspected cause of severe esophagitis (per EGD 08/05/21)   Dextromethorphan Rash    Patient Measurements: Height: 5' 4"$  (162.6 cm) Weight: 78 kg (172 lb) IBW/kg (Calculated) : 54.7 Heparin Dosing Weight: 71kg  Vital Signs: Temp: 98.5 F (36.9 C) (02/21 1232) Temp Source: Oral (02/21 1232) BP: 152/60 (02/21 1245) Pulse Rate: 81 (02/21 1245)  Labs: Recent Labs    05/20/22 1316 05/20/22 1409 05/20/22 1425 05/20/22 1458 05/20/22 1819  HGB 12.5 13.9 13.6  --  11.9*  HCT 41.3 41.0 40.0  --  35.0*  PLT 202  --   --   --   --   CREATININE 1.65* 1.70*  --   --   --   TROPONINIHS 13  --   --  13  --     Estimated Creatinine Clearance: 24.4 mL/min (A) (by C-G formula based on SCr of 1.7 mg/dL (H)).   Medical History: Past Medical History:  Diagnosis Date   Acute respiratory failure with hypoxia ([place]) 05/20/2017   Anemia, unspecified 10/28/2012   Anxiety state, unspecified 10/28/2012   CAD (coronary artery disease)    a. Stent RCA in [facility];  b. Cath approx 2009 - nonobs per pt report.   Cataract    immature on the left eye   Chronic insomnia 02/07/2013   Chronic lower back pain    scoliosis   CKD (chronic kidney disease) stage 3, GFR 30-59 ml/min (HCC)    CVA (cerebral infarction) 10/29/2012   DDD (degenerative disc disease)    Diverticulosis    GERD (gastroesophageal reflux disease) 09/01/2010   Hemorrhoids    Herniated nucleus pulposus, L5-S1, right 11/04/2015   Hyperlipidemia    takes Lipitor daily   Hypertension    takes Amlodipine,Losartan,Metoprolol,and HCTZ daily   Incontinence of urine    Insomnia    takes Restoril nightly   Lumbar stenosis 04/25/2013   Major depressive disorder, recurrent episode, moderate ([place]) 07/16/2013   Osteoporosis    PAF (paroxysmal atrial  fibrillation) ([place]) 2011   a. lone epidode in 2011 according to notes.   Rheumatoid arthritis ([place])    Scoliosis    Sepsis ([place]) 05/2019   Stroke [facility])    Temporal arteritis ([place]) 2011   a. followed @ [facility]; potential flareup 10/28/2012/notes 10/28/2012   Vocal cord dysfunction    "they don't operate properly" (10/28/2012)    Medications:  (Not in a hospital admission)  Scheduled:   sodium phosphate  1 enema Rectal Once   Infusions:   azithromycin (ZITHROMAX) 500 mg in sodium chloride 0.9 % 250 mL IVPB 500 mg (05/20/22 1904)   heparin     lactated ringers      Assessment: Pt with a hx of AF on apixaban presented with PNA. Heparin ordered for bridging. Her last dose was at 2100 on 2/20. We will use PTT for now until correlation.   Hgb 11.9, plt wnl  Goal of Therapy:  Heparin level 0.3-0.7 units/ml aPTT 66-102 seconds Monitor platelets by anticoagulation protocol: Yes   Plan:  Heparin infusion 1000 units/hr Check 8 hr HL/PTT Daily HL, PTT and CBC  Onnie Boer, PharmD, BCIDP, AAHIVP, CPP Infectious Disease Pharmacist 05/20/2022 8:05 PM

## 2022-05-20 NOTE — H&P (Addendum)
History and Physical    Betty Jackson [id number] DOB: Sep 11, 1934 DOA: 05/20/2022  PCP: Betty Morin, MD  Patient coming from: SNF  I have personally briefly reviewed patient's old medical records in [place]  Chief Complaint: confusion, hypoxia  HPI: Betty Jackson is Betty Jackson 88 y.o. female with medical history significant of chronic pain on chronic opiates, atrial fib (on eliquis), RA and temporal arteritis (on chronic steroids) and other medical issues here with confusion.  She typically is in the skilled level of care in [facility] facility.  Generally is able to transfer, but otherwise wheelchair bound.  Had been going to visit family on the weekends, but hasn't done this since going to watch the superbowl.  Had covid in January and things seem to be declining after this with worsened pain and less able to mobilize, leave facility.  Her pain meds were adjusted 3 weeks ago and norco was increased to TID dosing.    4-5 days ago, her daughters noticed more confusion and lethargy.  They noted she's not as "mentally astute" as she typically is.  She wasn't answering the phone or calling and their conversations weren't the same.  Facility didn't notice this right away, but her daughters noticed the change.  Recently she had nausea, vomiting, CP and abdominal discomfort.  Also c/o cough.  She was brought to the ED with altered mental status, hypoxia, and reported last normal yesterday (though daughters noted this has been going on for [facility] Jackson few days).    ED Course: Labs, imaging, IVF, antibiotics.  Admit for pneumonia, hypercarbic resp failure.   Review of Systems: As per HPI otherwise all other systems reviewed and are negative.  Past Medical History:  Diagnosis Date   Acute respiratory failure with hypoxia ([place]) 05/20/2017   Anemia, unspecified 10/28/2012   Anxiety state, unspecified 10/28/2012   CAD (coronary artery disease)    [facility]. Stent RCA in [facility];  b. Cath approx 2009 -  nonobs per pt report.   Cataract    immature on the left eye   Chronic insomnia 02/07/2013   Chronic lower back pain    scoliosis   CKD (chronic kidney disease) stage 3, GFR 30-59 ml/min (HCC)    CVA (cerebral infarction) 10/29/2012   DDD (degenerative disc disease)    Diverticulosis    GERD (gastroesophageal reflux disease) 09/01/2010   Hemorrhoids    Herniated nucleus pulposus, L5-S1, right 11/04/2015   Hyperlipidemia    takes Lipitor daily   Hypertension    takes Amlodipine,Losartan,Metoprolol,and HCTZ daily   Incontinence of urine    Insomnia    takes Restoril nightly   Lumbar stenosis 04/25/2013   Major depressive disorder, recurrent episode, moderate ([place]) 07/16/2013   Osteoporosis    PAF (paroxysmal atrial fibrillation) ([place]) 2011   Betty Jackson. lone epidode in 2011 according to notes.   Rheumatoid arthritis ([place])    Scoliosis    Sepsis ([place]) 05/2019   Stroke [facility])    Temporal arteritis ([place]) 2011   Betty Jackson. followed @ [place]; potential flareup 10/28/2012/notes 10/28/2012   Vocal cord dysfunction    "they don't operate properly" (10/28/2012)    Past Surgical History:  Procedure Laterality Date   ABDOMINAL HYSTERECTOMY  ~ 1984   vaginally   BACK SURGERY  7-48yr ago   X Stop   BLADDER SUSPENSION  2001   BREAST BIOPSY Right    CATARACT EXTRACTION W/ INTRAOCULAR LENS IMPLANT Right ~ 08/2012   CHEST TUBE INSERTION Left 03/08/2019  Procedure: Chest Tube Insertion;  Surgeon: Ivin Poot, MD;  Location: [place];  Service: Thoracic;  Laterality: Left;   COLONOSCOPY  01/26/2012   Procedure: COLONOSCOPY;  Surgeon: Ladene Artist, MD,FACG;  Location: [facility] ENDOSCOPY;  Service: Endoscopy;  Laterality: N/Shiesha Jahn;  note the EGD is possible   COLONOSCOPY WITH PROPOFOL N/Heavenly Christine 09/10/2021   Procedure: COLONOSCOPY WITH PROPOFOL;  Surgeon: Gatha Mayer, MD;  Location: [place];  Service: Gastroenterology;  Laterality: N/Elhadj Girton;   CORONARY ANGIOPLASTY WITH STENT PLACEMENT  2006   X 1 stent   EPIDURAL BLOCK  INJECTION     ESOPHAGOGASTRODUODENOSCOPY  01/26/2012   Procedure: ESOPHAGOGASTRODUODENOSCOPY (EGD);  Surgeon: Ladene Artist, MD,FACG;  Location: [facility] ENDOSCOPY;  Service: Endoscopy;  Laterality: N/Deborah Lazcano;   ESOPHAGOGASTRODUODENOSCOPY (EGD) WITH PROPOFOL N/Omero Kowal 08/05/2021   Procedure: ESOPHAGOGASTRODUODENOSCOPY (EGD) WITH PROPOFOL;  Surgeon: Doran Stabler, MD;  Location: [place];  Service: Gastroenterology;  Laterality: N/Naw Lasala;   ESOPHAGOGASTRODUODENOSCOPY (EGD) WITH PROPOFOL N/Zurisadai Helminiak 09/10/2021   Procedure: ESOPHAGOGASTRODUODENOSCOPY (EGD) WITH PROPOFOL;  Surgeon: Gatha Mayer, MD;  Location: [place];  Service: Gastroenterology;  Laterality: N/Davius Goudeau;   FINE NEEDLE ASPIRATION Right 09/26/2019   Procedure: FINE NEEDLE ASPIRATION;  Surgeon: Altamese [place], MD;  Location: [place];  Service: Orthopedics;  Laterality: Right;   FOREIGN BODY REMOVAL  08/05/2021   Procedure: FOREIGN BODY REMOVAL;  Surgeon: Doran Stabler, MD;  Location: [facility] ENDOSCOPY;  Service: Gastroenterology;;   HEMIARTHROPLASTY HIP Right 2012   IR EPIDUROGRAPHY  04/20/2019   LAPAROSCOPIC CHOLECYSTECTOMY  2001   LUMBAR FUSION  03/2013   LUMBAR LAMINECTOMY/DECOMPRESSION MICRODISCECTOMY Right 11/04/2015   Procedure: Right Lumbar Five-Sacral One Microdiskectomy;  Surgeon: Kristeen Miss, MD;  Location: [place] NEURO ORS;  Service: Neurosurgery;  Laterality: Right;  Right L5-S1 Microdiskectomy   moles removed that required stiches     one on leg and one on face   ORIF FEMUR FRACTURE Right 09/26/2019   Procedure: OPEN REDUCTION INTERNAL FIXATION (ORIF) DISTAL FEMUR FRACTURE;  Surgeon: Altamese [place], MD;  Location: [place];  Service: Orthopedics;  Laterality: Right;   ORIF FEMUR FRACTURE Right 10/17/2019   Procedure: OPEN REDUCTION INTERNAL FIXATION (ORIF) DISTAL FEMUR FRACTURE;  Surgeon: Altamese [place], MD;  Location: [place];  Service: Orthopedics;  Laterality: Right;   POLYPECTOMY  09/10/2021   Procedure: POLYPECTOMY;  Surgeon: Gatha Mayer, MD;   Location: [place];  Service: Gastroenterology;;   SUBXYPHOID PERICARDIAL WINDOW N/Siddarth Hsiung 03/08/2019   Procedure: SUBXYPHOID PERICARDIAL WINDOW;  Surgeon: Ivin Poot, MD;  Location: [place];  Service: Thoracic;  Laterality: N/Adetokunbo Mccadden;   TEE WITHOUT CARDIOVERSION N/Mozella Rexrode 03/08/2019   Procedure: TRANSESOPHAGEAL ECHOCARDIOGRAM (TEE);  Surgeon: Prescott Gum, Collier Salina, MD;  Location: [place];  Service: Thoracic;  Laterality: N/Aly Seidenberg;   TEMPORAL ARTERY BIOPSY / LIGATION Bilateral 2011   TONSILLECTOMY AND ADENOIDECTOMY     at age 54   [place]  ~ [place]  ~ 2010   "lower back" (10/28/2012)    Social History  reports that she has never smoked. She has never used smokeless tobacco. She reports current alcohol use. She reports that she does not use drugs.  Allergies  Allergen Reactions   Alendronate Sodium     Suspected cause of severe esophagitis (per EGD 08/05/21)   Dextromethorphan Rash    Family History  Problem Relation Age of Onset   Brain cancer Mother 23   Heart attack Father    Breast cancer Daughter 66   Heart disease Other  Hypertension Other    Arthritis Neg Hx    Colon cancer Neg Hx    Osteoporosis Neg Hx    Prior to Admission medications   Medication Sig Start Date End Date Taking? Authorizing Provider  acetaminophen (TYLENOL) 500 MG tablet Take 500 mg by mouth every 6 (six) hours as needed for mild pain.   Yes [provider]  allopurinol (ZYLOPRIM) 100 MG tablet Take 100 mg by mouth daily.   Yes [provider]  amiodarone (PACERONE) 200 MG tablet Take 259m twice daily for 5 days, then take 209monce daily. Patient taking differently: Take 200 mg by mouth daily. 08/11/21  Yes KrBonnielee HaffMD  atorvastatin (LIPITOR) 80 MG tablet Take 1 tablet (80 mg total) by mouth daily. Patient taking differently: Take 80 mg by mouth at bedtime. 09/25/20  Yes SeGarvin FilaMD  cetirizine (ZYRTEC) 10 MG tablet Take 10 mg by mouth at bedtime.   Yes [provider]  cholecalciferol (VITAMIN D) 25 MCG tablet Take 2 tablets (2,000 Units total) by mouth 2 (two) times daily. 09/29/19  Yes PaAinsley SpinnerPA-C  diltiazem (CARDIZEM CD) 240 MG 24 hr capsule Take 240 mg by mouth every evening. 10/10/21  Yes [provider]  ELIQUIS 5 MG TABS tablet TAKE 1 TABLET BY MOUTH TWICE Raniyah Curenton DAY Patient taking differently: Take 5 mg by mouth 2 (two) times daily. 05/09/20  Yes McBurnell BlanksMD  escitalopram (LEXAPRO) 10 MG tablet TAKE 1 TABLET BY MOUTH EVERY DAY Patient taking differently: Take 10 mg by mouth daily. 08/27/20  Yes Burchette, BrAlinda SierrasMD  fentaNYL 37.5 MCG/HR PT72 Place 1 patch onto the skin every 3 (three) days. 08/10/21  Yes KrBonnielee HaffMD  ferrous sulfate 325 (65 FE) MG tablet Take 1 tablet (325 mg total) by mouth 2 (two) times daily with Akeela Busk meal. 04/21/19  Yes LaDarrick MeigsGaMarge DuncansMD  furosemide (LASIX) 40 MG tablet Take 40 mg by mouth daily.   Yes [provider]  furosemide (LASIX) 40 MG tablet Take 1 tablet (40 mg total) by mouth daily as needed (extra dose, for weight gain of 2 pounds overnight or 5 pound in 1 week). 09/11/21  Yes MaBarton DuboisMD  HYDROcodone-acetaminophen (NORCO) 5-325 MG tablet Take 1 tablet by mouth every 12 (twelve) hours as needed for severe pain. 09/11/21  Yes MaBarton DuboisMD  ipratropium-albuterol (DUONEB) 0.5-2.5 (3) MG/3ML SOLN Take 3 mLs by nebulization every 4 (four) hours as needed. Patient taking differently: Take 3 mLs by nebulization every 4 (four) hours as needed (wheezing/shortness of breath). 08/10/21  Yes KrBonnielee HaffMD  meloxicam (MOBIC) 7.5 MG tablet Take 7.5 mg by mouth daily.   Yes [provider]  metoprolol tartrate (LOPRESSOR) 100 MG tablet Take 100 mg by mouth 2 (two) times daily.   Yes [provider]  Multiple Vitamin (MULTIVITAMIN WITH MINERALS) TABS tablet Take 1 tablet by mouth daily. 10/21/19  Yes PaAinsley SpinnerPA-C  nitroGLYCERIN (NITROSTAT) 0.4 MG SL  tablet Place 1 tablet (0.4 mg total) under the tongue every 5 (five) minutes as needed for chest pain. 02/25/22  Yes McBurnell BlanksMD  Nutritional Supplement LIQD Take 120 mLs by mouth daily. House stock supplement   Yes [provider]  ondansetron (ZOFRAN) 4 MG tablet Take 4 mg by mouth every 8 (eight) hours as needed for nausea or vomiting.   Yes [provider]  OXYGEN Inhale 2 L/min into the lungs as needed (  maintain SATS above 90%).   Yes [provider]  pantoprazole (PROTONIX) 40 MG tablet Take 1 tablet (40 mg total) by mouth 2 (two) times daily. 08/11/21  Yes Bonnielee Haff, MD  polyethylene glycol (MIRALAX) 17 g packet Take 17 g by mouth daily. Patient taking differently: Take 17 g by mouth daily as needed for mild constipation. 09/11/21  Yes Barton Dubois, MD  potassium chloride (KLOR-CON M) 10 MEQ tablet Take 3 tablets (30 mEq total) by mouth daily. 09/11/21  Yes Barton Dubois, MD  Pramox-PE-Glycerin-Petrolatum (HEMORRHOIDAL MAX STR) 1-0.25-14.4-15 % CREA Apply 1 Application topically 2 (two) times daily as needed (hemorrhoidal pain/discomfort.). 09/11/21  Yes Barton Dubois, MD  predniSONE (DELTASONE) 10 MG tablet Take 1 tablet (10 mg total) by mouth daily with breakfast. Resume after 3 days of 44m daily as instructed. 08/15/21  Yes KBonnielee Haff MD  QUEtiapine (SEROQUEL) 25 MG tablet Take 1 tablet (25 mg total) by mouth at bedtime. 03/16/21 05/20/22 Yes Pahwani, REinar Grad MD  sulfaSALAzine (AZULFIDINE) 500 MG EC tablet Take 500 mg by mouth daily.   Yes [provider]    Physical Exam: Vitals:   05/20/22 1236 05/20/22 1237 05/20/22 1238 05/20/22 1245  BP:    (!) 152/60  Pulse:   81 81  Resp:   18 (!) 21  Temp:      TempSrc:      SpO2: (!) 86%  96% 95%  Weight:  78 kg    Height:  5' 4"$  (1.626 m)      Constitutional: somnolent Vitals:   05/20/22 1236 05/20/22 1237 05/20/22 1238 05/20/22 1245  BP:    (!) 152/60  Pulse:   81 81   Resp:   18 (!) 21  Temp:      TempSrc:      SpO2: (!) 86%  96% 95%  Weight:  78 kg    Height:  5' 4"$  (1.626 m)     Eyes: PERRL Neck: normal, supple Respiratory: distant, diminished, no clear adventitious lung sounds, occasional snoring.  Cardiovascular: RRR Abdomen: no tenderness, no masses palpated.  Musculoskeletal: no clubbing / cyanosis. Skin: no rashes, lesions, ulcers. No induration Neurologic: somnolent, opens eyes to voice, follows some commands, inconsistently.  Squeezes hands and wiggles toes, inconsistent, minimal effort.  PERRL.  Occasional jerking.  Psychiatric: Normal judgment and insight. Alert and oriented x 3. Normal mood.   Labs on Admission: I have personally reviewed following labs and imaging studies  CBC: Recent Labs  Lab 05/20/22 1316 05/20/22 1409 05/20/22 1425 05/20/22 1819  WBC 12.9*  --   --   --   HGB 12.5 13.9 13.6 11.9*  HCT 41.3 41.0 40.0 35.0*  MCV 103.8*  --   --   --   PLT 202  --   --   --     Basic Metabolic Panel: Recent Labs  Lab 05/20/22 1316 05/20/22 1409 05/20/22 1425 05/20/22 1819  NA 147* 150* 149* 148*  K 3.7 3.6 3.6 4.4  CL 107 108  --   --   CO2 28  --   --   --   GLUCOSE 123* 121*  --   --   BUN 31* 33*  --   --   CREATININE 1.65* 1.70*  --   --   CALCIUM 9.3  --   --   --     GFR: Estimated Creatinine Clearance: 24.4 mL/min (Matelyn Antonelli) (by C-G formula based on SCr of 1.7 mg/dL (H)).  Liver  Function Tests: Recent Labs  Lab 05/20/22 1316  AST 24  ALT 14  ALKPHOS 81  BILITOT 1.4*  PROT 6.5  ALBUMIN 3.4*    Urine analysis:    Component Value Date/Time   COLORURINE YELLOW 05/20/2022 1625   APPEARANCEUR CLEAR 05/20/2022 1625   LABSPEC 1.010 05/20/2022 1625   PHURINE 5.0 05/20/2022 1625   GLUCOSEU NEGATIVE 05/20/2022 1625   GLUCOSEU NEGATIVE 07/31/2016 1018   HGBUR NEGATIVE 05/20/2022 1625   BILIRUBINUR NEGATIVE 05/20/2022 1625   BILIRUBINUR neg 07/31/2016 1012   KETONESUR NEGATIVE 05/20/2022 1625    PROTEINUR NEGATIVE 05/20/2022 1625   UROBILINOGEN 0.2 07/31/2016 1018   UROBILINOGEN 0.2 07/31/2016 1012   NITRITE NEGATIVE 05/20/2022 1625   LEUKOCYTESUR NEGATIVE 05/20/2022 1625    Radiological Exams on Admission: CT CHEST ABDOMEN PELVIS WO CONTRAST  Result Date: 05/20/2022 CLINICAL DATA:  Abdominal pain, acute, nonlocalized.  Chest pain. EXAM: CT CHEST, ABDOMEN AND PELVIS WITHOUT CONTRAST TECHNIQUE: Multidetector CT imaging of the chest, abdomen and pelvis was performed following the standard protocol without IV contrast. RADIATION DOSE REDUCTION: This exam was performed according to the departmental dose-optimization program which includes automated exposure control, adjustment of the mA and/or kV according to patient size and/or use of iterative reconstruction technique. COMPARISON:  Chest CT 09/04/2021.  CT abdomen 07/17/2020. FINDINGS: CT CHEST FINDINGS Cardiovascular: Mild cardiomegaly. Coronary artery calcification and aortic atherosclerotic calcification. Mediastinum/Nodes: No mediastinal or hilar mass or lymphadenopathy. Lungs/Pleura: No pleural effusion. Patchy density at both lung bases left more than right could be scarring or bibasilar pneumonia. Lung apices show benign pleural and parenchymal scarring. Musculoskeletal: Old augmented compression fracture at T11 without further collapse. Chronic superior endplate fracture at T7 with loss of height of 30%. No progressive collapse. No new fracture. CT ABDOMEN PELVIS FINDINGS Hepatobiliary: No liver lesion seen on this study done without contrast. Previous cholecystectomy. Mild chronic ductal prominence. Pancreas: Motion degraded.  No abnormality seen. Spleen: Negative Adrenals/Urinary Tract: Adrenal glands are normal. No acute renal finding. No hydronephrosis. Bladder appears unremarkable. Stomach/Bowel: Stomach and small intestine are unremarkable. Normal appendix. Diverticulosis of the colon without visible diverticulitis. Rectal fecal  impaction. Vascular/Lymphatic: Aortic atherosclerosis. No aneurysm. IVC is normal. No adenopathy. Reproductive: Previous hysterectomy.  No pelvic mass. Other: No free fluid or air. Musculoskeletal: No acute lumbar spine or pelvic abnormality. Old vertebral augmentation at L1. Previous decompression and fusion L2 through L4. IMPRESSION: 1. Patchy density at both lung bases left more than right could be scarring or bibasilar pneumonia. 2. Cardiomegaly. Coronary artery calcification. Aortic atherosclerosis. 3. No acute or traumatic finding in the abdomen or pelvis. 4. Rectal fecal impaction. 5. Diverticulosis without visible diverticulitis. 6. Old augmented compression fracture at T11 without further collapse. Chronic superior endplate fracture at T7 with loss of height of 30%. No progressive collapse. No new fracture. Old vertebral augmentation at L1. Previous decompression and fusion L2 through L4. Aortic Atherosclerosis (ICD10-I70.0). Electronically Signed   By: Nelson Chimes M.D.   On: 05/20/2022 16:10   CT Head Wo Contrast  Result Date: 05/20/2022 CLINICAL DATA:  Altered mental status EXAM: CT HEAD WITHOUT CONTRAST TECHNIQUE: Contiguous axial images were obtained from the base of the skull through the vertex without intravenous contrast. RADIATION DOSE REDUCTION: This exam was performed according to the departmental dose-optimization program which includes automated exposure control, adjustment of the mA and/or kV according to patient size and/or use of iterative reconstruction technique. COMPARISON:  04/30/21 CT head FINDINGS: Brain: No evidence of acute infarction, hemorrhage, hydrocephalus,  extra-axial collection or mass lesion/mass effect. Sequela of severe chronic microvascular ischemic change, unchanged from prior exam. Vascular: No hyperdense vessel. Redemonstrated calcified atherosclerotic plaque in the proximal left M1. Skull: Normal. Negative for fracture or focal lesion. Sinuses/Orbits: Bilateral lens  replacement. Trace left mastoid effusion. No middle ear effusion. Paranasal sinuses are clear. Other: There is Laakea Pereira pannus at the craniocervical junction. IMPRESSION: No acute intracranial abnormality. Electronically Signed   By: Marin Roberts M.D.   On: 05/20/2022 16:04   DG Chest Port 1 View  Result Date: 05/20/2022 CLINICAL DATA:  Chest pain, emesis and shortness of breath. EXAM: PORTABLE CHEST 1 VIEW COMPARISON:  09/03/2021; 08/06/2021; chest CT-09/04/2021 FINDINGS: Grossly unchanged cardiac silhouette and mediastinal contours. Grossly unchanged bibasilar heterogeneous opacities, left-greater-than-right. No new focal airspace opacities. No pleural effusion or pneumothorax. No evidence of edema. No acute osseous abnormalities. Degenerative change of the bilateral glenohumeral joints is suspected though incompletely evaluated. Postcholecystectomy. IMPRESSION: Similar findings of bibasilar atelectasis without superimposed acute cardiopulmonary disease on this AP portable examination. Electronically Signed   By: Sandi Mariscal M.D.   On: 05/20/2022 13:14    EKG: Independently reviewed. Sinus, prolonged QTc  Assessment/Plan Principal Problem:   Pneumonia    Assessment and Plan:  Acute Hypercarbic and Hypoxic Respiratory Failure  Due to pneumonia, opiates/respiratory depression, encephalopathy Treating pna with abx Trial of bipap for mild hypercapnia (with normal pH) given her encephalopathy  Community Acquired Pneumonia  CT chest abdomen pelvis with patchy density at both lung bases L>R (scarring vs pneumonia) Requiring 6 L at this time (greater than chronic home 2 L prn).  Mild leukocytosis.  Encephalopathy.  Fits criteria for sepsis.  Continue ceftriaxone/azithromycin at this time Blood cultures, sputum cultures if able MRSA PCR RVP.  Negative covid, influenza, RSV Given her chronic steroid use at home, transition to IV steroids for her acute infection  Acute Metabolic  Encephalopathy Multifactorial -> pneumonia, hypercarbic resp failure, dehydration/AKI/hypernatremia, opiates/sedation - unclear inciting event, though maybe related to increased norco dosing [place] few weeks ago +/- pneumonia? Follow b12, folate, B1, VBG, ammonia, TSH Head CT without acute intracranial abnormality Hold opiates for now (discussed removing fentanyl patch with daughters, pretty resistant to this due to bad experiences with withdrawal in past.  Discussed, my recommendation would be to hold given our concern for her encephalopathy and possible contribution from opiates.  Decided on compromise to decrease dose to 12 mcg for now).  Acute Kidney Injury  Dehydration  Hypernatremia Suspect this represents volume depletion and dehydration in setting of her encephalopathy over the last few days Follow with IVF UA bland CT without hydro   Nausea  Vomiting  Chest Pain  Abdominal Discomfort Suspect related to pneumonia Negative troponins Discussed aspiration risks related to bipap, but no reported vomiting since yesterday.  Will see if RT thinks she'll be able to tolerate.  Atrial Fibrillation Chadsvasc 6 Holding eliquis for now, will discuss heparin gtt with daughters Holding amiodarone and metoprolol as well as diltiazem while she's NPO If issues with rate, can start dilt gtt  Fecal Impaction Enema ordered by EDP, follow  Chronic Pain Reduced dose fentanyl patch Hold norco Hold meloxicam while unable to take PO Family concerned about holding meds and prior issues with withdrawal, delirium, precipitated RVR, etc (discussed need to hold meds for now given current encephalopathy)  Rheumatoid Arthritis  Hx Temporal Arteritis Hold sulfasalazine IV steroids while unable to take PO  Gout Holding allopurinol while unable to take PO  HLD  CAD  Holding lipitor Eliquis on hold while NPO   Goals of care DNR/DNI, ok with bipap.  If not improving, will discuss palliative care c/s  and further goc.    DVT prophylaxis: Heparin   Code Status:   DNR - reviewed with daughters Family Communication:  Daughter over phone and at bedside  Disposition Plan:   Patient is from:  SNF  Anticipated DC to:  Pending improvement  Anticipated DC date:  Pending improvement  Anticipated DC barriers: Respiratory failure, encephalopathy  Consults called:  none  Admission status:  inpatient   Severity of Illness: The appropriate patient status for this patient is INPATIENT. Inpatient status is judged to be reasonable and necessary in order to provide the required intensity of service to ensure the patient's safety. The patient's presenting symptoms, physical exam findings, and initial radiographic and laboratory data in the context of their chronic comorbidities is felt to place them at high risk for further clinical deterioration. Furthermore, it is not anticipated that the patient will be medically stable for discharge from the hospital within 2 midnights of admission.   * I certify that at the point of admission it is my clinical judgment that the patient will require inpatient hospital care spanning beyond 2 midnights from the point of admission due to high intensity of service, high risk for further deterioration and high frequency of surveillance required.Fayrene Helper MD [facility]  How to contact the [facility] Attending or Consulting provider [place] or covering provider during after hours [place], for this patient?   Check the care team in [facility] and look for Lessie Manigo) attending/consulting [facility] provider listed and b) the [facility] team listed Log into [email or web address] and use [place]'s universal password to access. If you do not have the password, please contact the hospital operator. Locate the [facility] provider you are looking for under [facility] and page to Israa Caban number that you can be directly reached. If you still have difficulty reaching the provider, please page the [facility] (Director  on Call) for the Hospitalists listed on amion for assistance.  05/20/2022, 7:15 PM   &

## 2022-05-20 NOTE — ED Notes (Signed)
Oxygen level was low around83-86 on 4L [place] increased to 6L [place] and sats maintaining

## 2022-05-20 NOTE — ED Provider Notes (Signed)
[facility] Provider Note   CSN: [id number] Arrival date & time: 05/20/22  1228     History  Chief Complaint  Patient presents with   Chest Pain   Emesis    Betty Jackson is a 88 y.o. female.  88 yo F with hx of chronic hypoxia on 2L [place], CHF, cad sp pci, htn, hld, pericardial effusion sp window who presents with multiple complaints. History obtained per pt's daughter who says that over the past several days her mother has been less interactive than usual. Recently had nausea and vomiting with chest pain and abdominal pain that she has difficulty characterizing.  Patient also states that she has had a cough recently.  Unable to provide additional history.  Patient is DNR/DNI and has come with paperwork.  Is followed by pain management and has a fentanyl patch and has been taking Norco but her daughter reports that she has been on these medications for over 6 months.  No new medication changes.       Home Medications Prior to Admission medications   Medication Sig Start Date End Date Taking? Authorizing Provider  acetaminophen (TYLENOL) 500 MG tablet Take 500 mg by mouth every 6 (six) hours as needed for mild pain.   Yes [provider]  allopurinol (ZYLOPRIM) 100 MG tablet Take 100 mg by mouth daily.   Yes [provider]  amiodarone (PACERONE) 200 MG tablet Take 248m twice daily for 5 days, then take 2022monce daily. Patient taking differently: Take 200 mg by mouth daily. 08/11/21  Yes KrBonnielee HaffMD  atorvastatin (LIPITOR) 80 MG tablet Take 1 tablet (80 mg total) by mouth daily. Patient taking differently: Take 80 mg by mouth at bedtime. 09/25/20  Yes SeGarvin FilaMD  cetirizine (ZYRTEC) 10 MG tablet Take 10 mg by mouth at bedtime.   Yes [provider]  cholecalciferol (VITAMIN D) 25 MCG tablet Take 2 tablets (2,000 Units total) by mouth 2 (two) times daily. 09/29/19  Yes PaAinsley SpinnerPA-C  diltiazem  (CARDIZEM CD) 240 MG 24 hr capsule Take 240 mg by mouth every evening. 10/10/21  Yes [provider]  ELIQUIS 5 MG TABS tablet TAKE 1 TABLET BY MOUTH TWICE A DAY Patient taking differently: Take 5 mg by mouth 2 (two) times daily. 05/09/20  Yes McBurnell BlanksMD  escitalopram (LEXAPRO) 10 MG tablet TAKE 1 TABLET BY MOUTH EVERY DAY Patient taking differently: Take 10 mg by mouth daily. 08/27/20  Yes Burchette, BrAlinda SierrasMD  fentaNYL 37.5 MCG/HR PT72 Place 1 patch onto the skin every 3 (three) days. 08/10/21  Yes KrBonnielee HaffMD  ferrous sulfate 325 (65 FE) MG tablet Take 1 tablet (325 mg total) by mouth 2 (two) times daily with a meal. 04/21/19  Yes LaDarrick MeigsGaMarge DuncansMD  furosemide (LASIX) 40 MG tablet Take 40 mg by mouth daily.   Yes [provider]  furosemide (LASIX) 40 MG tablet Take 1 tablet (40 mg total) by mouth daily as needed (extra dose, for weight gain of 2 pounds overnight or 5 pound in 1 week). 09/11/21  Yes MaBarton DuboisMD  HYDROcodone-acetaminophen (NORCO) 5-325 MG tablet Take 1 tablet by mouth every 12 (twelve) hours as needed for severe pain. 09/11/21  Yes MaBarton DuboisMD  ipratropium-albuterol (DUONEB) 0.5-2.5 (3) MG/3ML SOLN Take 3 mLs by nebulization every 4 (four) hours as needed. Patient taking differently: Take 3 mLs by nebulization every 4 (four)  hours as needed (wheezing/shortness of breath). 08/10/21  Yes Bonnielee Haff, MD  meloxicam (MOBIC) 7.5 MG tablet Take 7.5 mg by mouth daily.   Yes [provider]  metoprolol tartrate (LOPRESSOR) 100 MG tablet Take 100 mg by mouth 2 (two) times daily.   Yes [provider]  Multiple Vitamin (MULTIVITAMIN WITH MINERALS) TABS tablet Take 1 tablet by mouth daily. 10/21/19  Yes Ainsley Spinner, PA-C  nitroGLYCERIN (NITROSTAT) 0.4 MG SL tablet Place 1 tablet (0.4 mg total) under the tongue every 5 (five) minutes as needed for chest pain. 02/25/22  Yes Burnell Blanks, MD  Nutritional  Supplement LIQD Take 120 mLs by mouth daily. House stock supplement   Yes [provider]  ondansetron (ZOFRAN) 4 MG tablet Take 4 mg by mouth every 8 (eight) hours as needed for nausea or vomiting.   Yes [provider]  OXYGEN Inhale 2 L/min into the lungs as needed (maintain SATS above 90%).   Yes [provider]  pantoprazole (PROTONIX) 40 MG tablet Take 1 tablet (40 mg total) by mouth 2 (two) times daily. 08/11/21  Yes Bonnielee Haff, MD  polyethylene glycol (MIRALAX) 17 g packet Take 17 g by mouth daily. Patient taking differently: Take 17 g by mouth daily as needed for mild constipation. 09/11/21  Yes Barton Dubois, MD  potassium chloride (KLOR-CON M) 10 MEQ tablet Take 3 tablets (30 mEq total) by mouth daily. 09/11/21  Yes Barton Dubois, MD  Pramox-PE-Glycerin-Petrolatum (HEMORRHOIDAL MAX STR) 1-0.25-14.4-15 % CREA Apply 1 Application topically 2 (two) times daily as needed (hemorrhoidal pain/discomfort.). 09/11/21  Yes Barton Dubois, MD  predniSONE (DELTASONE) 10 MG tablet Take 1 tablet (10 mg total) by mouth daily with breakfast. Resume after 3 days of 44m daily as instructed. 08/15/21  Yes KBonnielee Haff MD  QUEtiapine (SEROQUEL) 25 MG tablet Take 1 tablet (25 mg total) by mouth at bedtime. 03/16/21 05/20/22 Yes Pahwani, REinar Grad MD  sulfaSALAzine (AZULFIDINE) 500 MG EC tablet Take 500 mg by mouth daily.   Yes [provider]      Allergies    Alendronate sodium and Dextromethorphan    Review of Systems   Review of Systems  Physical Exam Updated Vital Signs BP (!) 152/60 Comment: 6L [place]  Pulse 81 Comment: 6L [place]  Temp 98.5 F (36.9 C) (Oral)   Resp (!) 21 Comment: 6L [place]  Ht 5' 4"$  (1.626 m)   Wt 78 kg   SpO2 95% Comment: 6L [place]  BMI 29.52 kg/m  Physical Exam Vitals and nursing note reviewed.  Constitutional:      General: She is not in acute distress.    Appearance: She is well-developed.     Comments: Drowsy and fatigued appearing.  Does  respond to commands and is alert and oriented to self only.  On 6 L nasal cannula.  Per daughter oxygen requirement and mental status are new changes.  HENT:     Head: Normocephalic and atraumatic.     Right Ear: External ear normal.     Left Ear: External ear normal.     Nose: Nose normal.  Eyes:     Extraocular Movements: Extraocular movements intact.     Conjunctiva/sclera: Conjunctivae normal.     Pupils: Pupils are equal, round, and reactive to light.  Cardiovascular:     Rate and Rhythm: Normal rate and regular rhythm.     Heart sounds: No murmur heard. Pulmonary:     Effort: Pulmonary effort is normal. No respiratory distress.  Comments: Breath sounds diminished over left base Abdominal:     General: Abdomen is flat. There is no distension.     Palpations: Abdomen is soft. There is no mass.     Tenderness: There is no abdominal tenderness. There is no guarding.  Musculoskeletal:     Cervical back: Normal range of motion and neck supple.     Right lower leg: No edema.     Left lower leg: No edema.  Skin:    General: Skin is warm and dry.  Neurological:     Mental Status: She is alert. Mental status is at baseline.     Cranial Nerves: No cranial nerve deficit.     Sensory: No sensory deficit.     Motor: Weakness (Global) present.  Psychiatric:        Mood and Affect: Mood normal.     ED Results / Procedures / Treatments   Labs (all labs ordered are listed, but only abnormal results are displayed) Labs Reviewed  COMPREHENSIVE METABOLIC PANEL - Abnormal; Notable for the following components:      Result Value   Sodium 147 (*)    Glucose, Bld 123 (*)    BUN 31 (*)    Creatinine, Ser 1.65 (*)    Albumin 3.4 (*)    Total Bilirubin 1.4 (*)    GFR, Estimated 30 (*)    All other components within normal limits  CBC - Abnormal; Notable for the following components:   WBC 12.9 (*)    MCV 103.8 (*)    All other components within normal limits  BRAIN NATRIURETIC  PEPTIDE - Abnormal; Notable for the following components:   B Natriuretic Peptide 122.1 (*)    All other components within normal limits  I-STAT VENOUS BLOOD GAS, ED - Abnormal; Notable for the following components:   pCO2, Ven 69.5 (*)    pO2, Ven 25 (*)    Bicarbonate 32.1 (*)    TCO2 34 (*)    Acid-Base Excess 3.0 (*)    Sodium 149 (*)    All other components within normal limits  I-STAT CHEM 8, ED - Abnormal; Notable for the following components:   Sodium 150 (*)    BUN 33 (*)    Creatinine, Ser 1.70 (*)    Glucose, Bld 121 (*)    All other components within normal limits  RESP PANEL BY RT-PCR (RSV, FLU A&B, COVID)  [id number]  RESPIRATORY PANEL BY PCR  URINALYSIS, ROUTINE W REFLEX MICROSCOPIC  I-STAT VENOUS BLOOD GAS, ED  TROPONIN I (HIGH SENSITIVITY)  TROPONIN I (HIGH SENSITIVITY)    EKG EKG Interpretation  Date/Time:  Wednesday May 20 2022 12:35:48 EST Ventricular Rate:  84 PR Interval:  221 QRS Duration: 90 QT Interval:  424 QTC Calculation: 502 R Axis:   -5 Text Interpretation: Sinus rhythm Prolonged PR interval Low voltage, precordial leads Abnormal R-wave progression, early transition Nonspecific T abnrm, anterolateral leads Prolonged QT interval Confirmed by Margaretmary Eddy [phone]) on 05/20/2022 3:11:47 PM  Radiology CT CHEST ABDOMEN PELVIS WO CONTRAST  Result Date: 05/20/2022 CLINICAL DATA:  Abdominal pain, acute, nonlocalized.  Chest pain. EXAM: CT CHEST, ABDOMEN AND PELVIS WITHOUT CONTRAST TECHNIQUE: Multidetector CT imaging of the chest, abdomen and pelvis was performed following the standard protocol without IV contrast. RADIATION DOSE REDUCTION: This exam was performed according to the departmental dose-optimization program which includes automated exposure control, adjustment of the mA and/or kV according to patient size and/or use of iterative reconstruction technique. COMPARISON:  Chest CT 09/04/2021.  CT abdomen 07/17/2020. FINDINGS: CT CHEST FINDINGS  Cardiovascular: Mild cardiomegaly. Coronary artery calcification and aortic atherosclerotic calcification. Mediastinum/Nodes: No mediastinal or hilar mass or lymphadenopathy. Lungs/Pleura: No pleural effusion. Patchy density at both lung bases left more than right could be scarring or bibasilar pneumonia. Lung apices show benign pleural and parenchymal scarring. Musculoskeletal: Old augmented compression fracture at T11 without further collapse. Chronic superior endplate fracture at T7 with loss of height of 30%. No progressive collapse. No new fracture. CT ABDOMEN PELVIS FINDINGS Hepatobiliary: No liver lesion seen on this study done without contrast. Previous cholecystectomy. Mild chronic ductal prominence. Pancreas: Motion degraded.  No abnormality seen. Spleen: Negative Adrenals/Urinary Tract: Adrenal glands are normal. No acute renal finding. No hydronephrosis. Bladder appears unremarkable. Stomach/Bowel: Stomach and small intestine are unremarkable. Normal appendix. Diverticulosis of the colon without visible diverticulitis. Rectal fecal impaction. Vascular/Lymphatic: Aortic atherosclerosis. No aneurysm. IVC is normal. No adenopathy. Reproductive: Previous hysterectomy.  No pelvic mass. Other: No free fluid or air. Musculoskeletal: No acute lumbar spine or pelvic abnormality. Old vertebral augmentation at L1. Previous decompression and fusion L2 through L4. IMPRESSION: 1. Patchy density at both lung bases left more than right could be scarring or bibasilar pneumonia. 2. Cardiomegaly. Coronary artery calcification. Aortic atherosclerosis. 3. No acute or traumatic finding in the abdomen or pelvis. 4. Rectal fecal impaction. 5. Diverticulosis without visible diverticulitis. 6. Old augmented compression fracture at T11 without further collapse. Chronic superior endplate fracture at T7 with loss of height of 30%. No progressive collapse. No new fracture. Old vertebral augmentation at L1. Previous decompression and  fusion L2 through L4. Aortic Atherosclerosis (ICD10-I70.0). Electronically Signed   By: Nelson Chimes M.D.   On: 05/20/2022 16:10   CT Head Wo Contrast  Result Date: 05/20/2022 CLINICAL DATA:  Altered mental status EXAM: CT HEAD WITHOUT CONTRAST TECHNIQUE: Contiguous axial images were obtained from the base of the skull through the vertex without intravenous contrast. RADIATION DOSE REDUCTION: This exam was performed according to the departmental dose-optimization program which includes automated exposure control, adjustment of the mA and/or kV according to patient size and/or use of iterative reconstruction technique. COMPARISON:  04/30/21 CT head FINDINGS: Brain: No evidence of acute infarction, hemorrhage, hydrocephalus, extra-axial collection or mass lesion/mass effect. Sequela of severe chronic microvascular ischemic change, unchanged from prior exam. Vascular: No hyperdense vessel. Redemonstrated calcified atherosclerotic plaque in the proximal left M1. Skull: Normal. Negative for fracture or focal lesion. Sinuses/Orbits: Bilateral lens replacement. Trace left mastoid effusion. No middle ear effusion. Paranasal sinuses are clear. Other: There is a pannus at the craniocervical junction. IMPRESSION: No acute intracranial abnormality. Electronically Signed   By: Marin Roberts M.D.   On: 05/20/2022 16:04   DG Chest Port 1 View  Result Date: 05/20/2022 CLINICAL DATA:  Chest pain, emesis and shortness of breath. EXAM: PORTABLE CHEST 1 VIEW COMPARISON:  09/03/2021; 08/06/2021; chest CT-09/04/2021 FINDINGS: Grossly unchanged cardiac silhouette and mediastinal contours. Grossly unchanged bibasilar heterogeneous opacities, left-greater-than-right. No new focal airspace opacities. No pleural effusion or pneumothorax. No evidence of edema. No acute osseous abnormalities. Degenerative change of the bilateral glenohumeral joints is suspected though incompletely evaluated. Postcholecystectomy. IMPRESSION: Similar  findings of bibasilar atelectasis without superimposed acute cardiopulmonary disease on this AP portable examination. Electronically Signed   By: Sandi Mariscal M.D.   On: 05/20/2022 13:14    Procedures Procedures   Ultrasound Guided Peripheral IV Indication: difficult to access - nursing staff unable to secure adequate peripheral IV access Location: L  Antecubital Catheter Size: 20 G  Static views used to identify the target vein then usual prep with Chloraprep. IV placed on first attempt under dynamic US guidance. Dark red flash noted, and catheter advanced smoothly into the vein. NS saline flushed without resistance, witnessed swelling, or patient discomfort. IV secured with I-site and tegaderm. The patient tolerated the procedure well. Performed By: Margaretmary Eddy MD   Medications Ordered in ED Medications  sodium phosphate (FLEET) 7-19 GM/118ML enema 1 enema (has no administration in time range)  cefTRIAXone (ROCEPHIN) 2 g in sodium chloride 0.9 % 100 mL IVPB (has no administration in time range)  azithromycin (ZITHROMAX) 500 mg in sodium chloride 0.9 % 250 mL IVPB (has no administration in time range)  ondansetron (ZOFRAN) injection 4 mg (has no administration in time range)  lactated ringers bolus 500 mL (has no administration in time range)  lactated ringers bolus 500 mL (500 mLs Intravenous New Bag/Given 05/20/22 1620)    ED Course/ Medical Decision Making/ A&P Clinical Course as of 05/20/22 1649  Wed May 20, 2022  1633 Dr Florene [place] from hospitalist admit. [RP]    Clinical Course User Index [RP] Fransico Meadow, MD                             Medical Decision Making Amount and/or Complexity of Data Reviewed Labs: ordered. Radiology: ordered.  Risk OTC drugs. Prescription drug management. Decision regarding hospitalization.   ZELLA ABOULHOSN is a 88 y.o. female with comorbidities that complicate the patient evaluation including 2L [place], CHF, cad sp pci, htn, hld,  pericardial effusion sp window, and chronic pain with fentanyl patch who presents with multiple complaints including decreased responsiveness, hypoxia, chest pain, abdominal pain, nausea and vomiting  Initial Ddx:  Pneumonia, PE, MI, heart failure exacerbation, opioid medication side effect, delirium secondary to infection, hypercapnic respiratory failure  MDM:  Initial differential broad given the limited available history and patient's medical conditions.  Concern about possible pneumonia given the patient's hypoxia.  Also considered pulmonary embolism so we will attempt to obtain CTA.  With her nausea and vomiting could be representative of MI so we will get troponin at this time.  Will also check BNP and chest x-ray to evaluate for pulmonary edema that would be reflective of heart failure exacerbation.  Will check urine see if she is delirious from urinary tract infection.  Ultimately feel that patient may be having also mental status and hypercapnic respiratory failure from her fentanyl patch and opiates.  However, patient's daughter does not want the fentanyl patches to be taken off at this time.  Plan:  Labs VBG BNP Troponin COVID/flu CT head CTA chest CT abdomen pelvis with IV contrast  ED Summary/Re-evaluation:  Patient underwent the above workup to ensure that she had an AKI.  Renal function precluded her from any contrast on her scans.  Was mildly hypercapnic with a pCO2 of 69 on a venous blood gas.  Talked to the family again about removing the fentanyl patch but they stated that they would prefer to prioritize her comfort at this time.  They understand that she could potentially die from hypercapnic respiratory failure.  Will hold off on BiPAP at this time due to aspiration risk since she is having nausea and vomiting.  CT scan obtained which did show pneumonia and rectal fecal impaction so antibiotics and enema were ordered.  Patient was admitted to stepdown unit given her 6 L  oxygen  requirement and hypercapnia which may require BiPAP at some point in time.  Was also found to have AKI so was given IV fluids.  Discussed goals of care with medicine team who will continue to talk to the family about her priorities.  Feel that patient may benefit from palliative care consult.  This patient presents to the ED for concern of complaints listed in HPI, this involves an extensive number of treatment options, and is a complaint that carries with it a high risk of complications and morbidity. Disposition including potential need for admission considered.   Dispo: Admit to Step Down  Additional history obtained from daughter Records reviewed Outpatient Clinic Notes and Admission Notes The following labs were independently interpreted: Chemistry and show AKI I independently reviewed the following imaging with scope of interpretation limited to determining acute life threatening conditions related to emergency care: Chest x-ray and CT Head and agree with the radiologist interpretation with the following exceptions: None I personally reviewed and interpreted cardiac monitoring: normal sinus rhythm  I personally reviewed and interpreted the pt's EKG: see above for interpretation  I have reviewed the patients home medications and made adjustments as needed Consults: Hospitalist Social Determinants of health:  Elderly with chronic pain  Final Clinical Impression(s) / ED Diagnoses Final diagnoses:  Hypoxia  Nausea and vomiting, unspecified vomiting type  Pneumonia of both lungs due to infectious organism, unspecified part of lung  AKI (acute kidney injury) ([place])    Rx / DC Orders ED Discharge Orders     None         Fransico Meadow, MD 05/20/22 1650

## 2022-05-21 ENCOUNTER — Inpatient Hospital Stay (HOSPITAL_COMMUNITY): Payer: 59

## 2022-05-21 DIAGNOSIS — J189 Pneumonia, unspecified organism: Secondary | ICD-10-CM | POA: Diagnosis not present

## 2022-05-21 LAB — RESPIRATORY PANEL BY PCR

## 2022-05-21 LAB — BLOOD GAS, ARTERIAL
Acid-Base Excess: 0.5 mmol/L (ref 0.0–2.0)
Bicarbonate: 26 mmol/L (ref 20.0–28.0)
Drawn by: 55062
O2 Saturation: 97.5 %
Patient temperature: 37.2
pCO2 arterial: 44 mmHg (ref 32–48)
pH, Arterial: 7.38 (ref 7.35–7.45)
pO2, Arterial: 99 mmHg (ref 83–108)

## 2022-05-21 LAB — BASIC METABOLIC PANEL
Anion gap: 10 (ref 5–15)
BUN: 38 mg/dL — ABNORMAL HIGH (ref 8–23)
CO2: 25 mmol/L (ref 22–32)
Calcium: 7.9 mg/dL — ABNORMAL LOW (ref 8.9–10.3)
Chloride: 111 mmol/L (ref 98–111)
Creatinine, Ser: 2.58 mg/dL — ABNORMAL HIGH (ref 0.44–1.00)
GFR, Estimated: 18 mL/min — ABNORMAL LOW (ref >60)
Glucose, Bld: 281 mg/dL — ABNORMAL HIGH (ref 70–99)
Potassium: 4.6 mmol/L (ref 3.5–5.1)
Sodium: 146 mmol/L — ABNORMAL HIGH (ref 135–145)

## 2022-05-21 LAB — COMPREHENSIVE METABOLIC PANEL
ALT: 19 U/L (ref 0–44)
AST: 41 U/L (ref 15–41)
Albumin: 2.7 g/dL — ABNORMAL LOW (ref 3.5–5.0)
Alkaline Phosphatase: 105 U/L (ref 38–126)
Anion gap: 15 (ref 5–15)
BUN: 31 mg/dL — ABNORMAL HIGH (ref 8–23)
CO2: 22 mmol/L (ref 22–32)
Calcium: 8.6 mg/dL — ABNORMAL LOW (ref 8.9–10.3)
Chloride: 112 mmol/L — ABNORMAL HIGH (ref 98–111)
Creatinine, Ser: 2.3 mg/dL — ABNORMAL HIGH (ref 0.44–1.00)
GFR, Estimated: 20 mL/min — ABNORMAL LOW (ref >60)
Glucose, Bld: 173 mg/dL — ABNORMAL HIGH (ref 70–99)
Potassium: 3.8 mmol/L (ref 3.5–5.1)
Sodium: 149 mmol/L — ABNORMAL HIGH (ref 135–145)
Total Bilirubin: 1 mg/dL (ref 0.3–1.2)
Total Protein: 5.7 g/dL — ABNORMAL LOW (ref 6.5–8.1)

## 2022-05-21 LAB — CBC
HCT: 39.1 % (ref 36.0–46.0)
HCT: 32.9 % — ABNORMAL LOW (ref 36.0–46.0)
Hemoglobin: 11.8 g/dL — ABNORMAL LOW (ref 12.0–15.0)
Hemoglobin: 9.9 g/dL — ABNORMAL LOW (ref 12.0–15.0)
MCH: 31.4 pg (ref 26.0–34.0)
MCH: 31.2 pg (ref 26.0–34.0)
MCHC: 30.2 g/dL (ref 30.0–36.0)
MCHC: 30.1 g/dL (ref 30.0–36.0)
MCV: 104 fL — ABNORMAL HIGH (ref 80.0–100.0)
MCV: 103.8 fL — ABNORMAL HIGH (ref 80.0–100.0)
Platelets: 189 10*3/uL (ref 150–400)
Platelets: 183 10*3/uL (ref 150–400)
RBC: 3.76 MIL/uL — ABNORMAL LOW (ref 3.87–5.11)
RBC: 3.17 MIL/uL — ABNORMAL LOW (ref 3.87–5.11)
RDW: 14.4 % (ref 11.5–15.5)
RDW: 14.3 % (ref 11.5–15.5)
WBC: 28.5 10*3/uL — ABNORMAL HIGH (ref 4.0–10.5)
WBC: 25.1 10*3/uL — ABNORMAL HIGH (ref 4.0–10.5)
nRBC: 0 % (ref 0.0–0.2)
nRBC: 0 % (ref 0.0–0.2)

## 2022-05-21 LAB — I-STAT VENOUS BLOOD GAS, ED
Acid-base deficit: 4 mmol/L — ABNORMAL HIGH (ref 0.0–2.0)
Bicarbonate: 22 mmol/L (ref 20.0–28.0)
Calcium, Ion: 1.1 mmol/L — ABNORMAL LOW (ref 1.15–1.40)
HCT: 36 % (ref 36.0–46.0)
Hemoglobin: 12.2 g/dL (ref 12.0–15.0)
O2 Saturation: 86 %
Potassium: 3.8 mmol/L (ref 3.5–5.1)
Sodium: 150 mmol/L — ABNORMAL HIGH (ref 135–145)
TCO2: 23 mmol/L (ref 22–32)
pCO2, Ven: 40.9 mmHg — ABNORMAL LOW (ref 44–60)
pH, Ven: 7.339 (ref 7.25–7.43)
pO2, Ven: 55 mmHg — ABNORMAL HIGH (ref 32–45)

## 2022-05-21 LAB — HIV ANTIBODY (ROUTINE TESTING W REFLEX): HIV Screen 4th Generation wRfx: NONREACTIVE

## 2022-05-21 LAB — FOLATE: Folate: 19.8 ng/mL (ref >5.9)

## 2022-05-21 LAB — I-STAT ARTERIAL BLOOD GAS, ED
Acid-base deficit: 3 mmol/L — ABNORMAL HIGH (ref 0.0–2.0)
Bicarbonate: 23.7 mmol/L (ref 20.0–28.0)
Calcium, Ion: 1.22 mmol/L (ref 1.15–1.40)
HCT: 34 % — ABNORMAL LOW (ref 36.0–46.0)
Hemoglobin: 11.6 g/dL — ABNORMAL LOW (ref 12.0–15.0)
O2 Saturation: 96 %
Patient temperature: 102.8
Potassium: 3.7 mmol/L (ref 3.5–5.1)
Sodium: 149 mmol/L — ABNORMAL HIGH (ref 135–145)
TCO2: 25 mmol/L (ref 22–32)
pCO2 arterial: 51.5 mmHg — ABNORMAL HIGH (ref 32–48)
pH, Arterial: 7.282 — ABNORMAL LOW (ref 7.35–7.45)
pO2, Arterial: 106 mmHg (ref 83–108)

## 2022-05-21 LAB — CREATININE, SERUM
Creatinine, Ser: 1.82 mg/dL — ABNORMAL HIGH (ref 0.44–1.00)
GFR, Estimated: 27 mL/min — ABNORMAL LOW (ref >60)

## 2022-05-21 LAB — HEPARIN LEVEL (UNFRACTIONATED): Heparin Unfractionated: >1.1 IU/mL — ABNORMAL HIGH (ref 0.30–0.70)

## 2022-05-21 LAB — TSH: TSH: 10.328 u[IU]/mL — ABNORMAL HIGH (ref 0.350–4.500)

## 2022-05-21 LAB — APTT
aPTT: >200 seconds (ref 24–36)
aPTT: >200 seconds (ref 24–36)
aPTT: 112 seconds — ABNORMAL HIGH (ref 24–36)

## 2022-05-21 LAB — OCCULT BLOOD X 1 CARD TO LAB, STOOL: Fecal Occult Bld: POSITIVE — AB

## 2022-05-21 LAB — GLUCOSE, CAPILLARY: Glucose-Capillary: 217 mg/dL — ABNORMAL HIGH (ref 70–99)

## 2022-05-21 LAB — CBG MONITORING, ED: Glucose-Capillary: 155 mg/dL — ABNORMAL HIGH (ref 70–99)

## 2022-05-21 LAB — T4, FREE: Free T4: 1.17 ng/dL — ABNORMAL HIGH (ref 0.61–1.12)

## 2022-05-21 LAB — VITAMIN B12: Vitamin B-12: 513 pg/mL (ref 180–914)

## 2022-05-21 MED ORDER — THIAMINE HCL 100 MG/ML IJ SOLN
100.0000 mg | Freq: Every day | INTRAMUSCULAR | Status: DC
  Administered 2022-05-24 – 2022-05-26 (×3): 100 mg via INTRAVENOUS
  Filled 2022-05-21 (×4): qty 2

## 2022-05-21 MED ORDER — FENTANYL CITRATE PF 50 MCG/ML IJ SOSY
PREFILLED_SYRINGE | INTRAMUSCULAR | Status: AC
  Administered 2022-05-21: 12.5 ug via INTRAVENOUS
  Filled 2022-05-21: qty 1

## 2022-05-21 MED ORDER — NALOXONE HCL 0.4 MG/ML IJ SOLN: 0.4000 mg | INTRAMUSCULAR | Status: DC | PRN

## 2022-05-21 MED ORDER — AMIODARONE HCL IN DEXTROSE 360-4.14 MG/200ML-% IV SOLN: 30.0000 mg/h | INTRAVENOUS | Status: DC

## 2022-05-21 MED ORDER — AMIODARONE HCL IN DEXTROSE 360-4.14 MG/200ML-% IV SOLN: 60.0000 mg/h | INTRAVENOUS | Status: DC

## 2022-05-21 MED ORDER — SODIUM CHLORIDE 0.9 % IV BOLUS
500.0000 mL | Freq: Once | INTRAVENOUS | Status: AC
  Administered 2022-05-21: 500 mL via INTRAVENOUS

## 2022-05-21 MED ORDER — FENTANYL 12 MCG/HR TD PT72
1.0000 | MEDICATED_PATCH | TRANSDERMAL | Status: DC
  Administered 2022-05-21 – 2022-05-24 (×2): 1 via TRANSDERMAL
  Filled 2022-05-21 (×2): qty 1

## 2022-05-21 MED ORDER — HEPARIN (PORCINE) 25000 UT/250ML-% IV SOLN
650.0000 [IU]/h | INTRAVENOUS | Status: DC
  Administered 2022-05-22: 650 [IU]/h via INTRAVENOUS
  Filled 2022-05-21: qty 250

## 2022-05-21 MED ORDER — DEXTROSE-NACL 5-0.45 % IV SOLN: INTRAVENOUS | Status: AC

## 2022-05-21 MED ORDER — SODIUM CHLORIDE 0.9 % IV BOLUS
250.0000 mL | Freq: Once | INTRAVENOUS | Status: AC
  Administered 2022-05-21: 250 mL via INTRAVENOUS

## 2022-05-21 MED ORDER — FENTANYL CITRATE PF 50 MCG/ML IJ SOSY
12.5000 ug | PREFILLED_SYRINGE | Freq: Once | INTRAMUSCULAR | Status: AC
  Filled 2022-05-21: qty 1

## 2022-05-21 MED ORDER — NALOXONE HCL 0.4 MG/ML IJ SOLN
INTRAMUSCULAR | Status: AC
  Filled 2022-05-21: qty 1

## 2022-05-21 MED ORDER — NALOXONE HCL 0.4 MG/ML IJ SOLN: 0.4000 mg | INTRAMUSCULAR | Status: AC

## 2022-05-21 MED ORDER — FENTANYL CITRATE PF 50 MCG/ML IJ SOSY
12.5000 ug | PREFILLED_SYRINGE | INTRAMUSCULAR | Status: AC | PRN
  Administered 2022-05-21 – 2022-05-23 (×4): 12.5 ug via INTRAVENOUS
  Filled 2022-05-21 (×4): qty 1

## 2022-05-21 MED ORDER — FENTANYL CITRATE PF 50 MCG/ML IJ SOSY
12.5000 ug | PREFILLED_SYRINGE | Freq: Once | INTRAMUSCULAR | Status: DC
  Administered 2022-05-21: 12.5 ug via INTRAVENOUS
  Filled 2022-05-21: qty 1

## 2022-05-21 MED ORDER — NALOXONE HCL 0.4 MG/ML IJ SOLN
INTRAMUSCULAR | Status: AC
  Administered 2022-05-21: 0.4 mg via INTRAVENOUS
  Filled 2022-05-21: qty 1

## 2022-05-21 MED ORDER — CHLORHEXIDINE GLUCONATE CLOTH 2 % EX PADS
6.0000 | MEDICATED_PAD | Freq: Every day | CUTANEOUS | Status: DC
  Administered 2022-05-21 – 2022-05-25 (×4): 6 via TOPICAL

## 2022-05-21 MED ORDER — THIAMINE HCL 100 MG/ML IJ SOLN
500.0000 mg | Freq: Every day | INTRAVENOUS | Status: AC
  Administered 2022-05-21 – 2022-05-22 (×2): 500 mg via INTRAVENOUS
  Filled 2022-05-21 (×4): qty 5

## 2022-05-21 NOTE — Progress Notes (Signed)
Returned to the patient's room to reassess her respiratory status.  Upon entering the room, the patient is pale, lethargic, with shallow breaths.  Her daughter [name] who is also her medical power of attorney is at bedside.    The patient is obtunded and not arousable.  Due to concern for respiratory failure and inefficient breaths, 1 dose of Narcan 0.4 mg was ordered to be administered.  After administrating 0.4 mg of Narcan, the patient became more alert and restless, blood pressure increased, and O2 saturation improved to 96-100% on 6 L from 92%.  The patient's daughter, [name], made decision for comfort care and requested to restart narcotic based analgesics and to also continue IV antibiotics for now.  Explained to [facility] the risks of restarting opiate-based analgesics which include obtundation, respiratory suppression/arrest and possibly cardiac arrest.  She understood the risks and would like to focus on comfort care only.  Critical care time: 30 minutes.

## 2022-05-21 NOTE — ED Notes (Signed)
RT at bedside weaning pt off bipap

## 2022-05-21 NOTE — ED Notes (Addendum)
Provider bedside.

## 2022-05-21 NOTE — ED Notes (Signed)
Provider bedside.

## 2022-05-21 NOTE — ED Notes (Addendum)
Fentanyl given per orders due to verbal orders/family request (healthcare power of attorney) who states she would like to put her on comfort care to Dr. Nevada Crane.

## 2022-05-21 NOTE — Progress Notes (Signed)
ANTICOAGULATION CONSULT NOTE  Pharmacy Consult for heparin Indication: atrial fibrillation  Allergies  Allergen Reactions   Alendronate Sodium     Suspected cause of severe esophagitis (per EGD 08/05/21)   Dextromethorphan Rash    Patient Measurements: Height: 5' 4"$  (162.6 cm) Weight: 78 kg (172 lb) IBW/kg (Calculated) : 54.7 Heparin Dosing Weight: 71kg  Vital Signs: Temp: 98.5 F (36.9 C) (02/22 1105) Temp Source: Axillary (02/22 1105) BP: 114/48 (02/22 1230) Pulse Rate: 78 (02/22 1230)  Labs: Recent Labs    05/20/22 1316 05/20/22 1409 05/20/22 1425 05/20/22 1458 05/20/22 1819 05/20/22 2310 05/20/22 2331 05/21/22 0151 05/21/22 0600 05/21/22 0609 05/21/22 0843 05/21/22 1146  HGB 12.5 13.9   < >  --    < > 12.9   < > 11.6* 11.8* 12.2  --   --   HCT 41.3 41.0   < >  --    < > 41.7   < > 34.0* 39.1 36.0  --   --   PLT 202  --   --   --   --  178  --   --  189  --   --   --   APTT  --   --   --   --   --   --   --   --   --   --  >200* >200*  HEPARINUNFRC  --   --   --   --   --   --   --   --   --   --  >1.10*  --   CREATININE 1.65* 1.70*  --   --   --  1.82*  --   --  2.30*  --   --   --   TROPONINIHS 13  --   --  13  --   --   --   --   --   --   --   --    < > = values in this interval not displayed.     Estimated Creatinine Clearance: 18.1 mL/min (A) (by C-G formula based on SCr of 2.3 mg/dL (H)).   Medical History: Past Medical History:  Diagnosis Date   Acute respiratory failure with hypoxia ([place]) 05/20/2017   Anemia, unspecified 10/28/2012   Anxiety state, unspecified 10/28/2012   CAD (coronary artery disease)    a. Stent RCA in [facility];  b. Cath approx 2009 - nonobs per pt report.   Cataract    immature on the left eye   Chronic insomnia 02/07/2013   Chronic lower back pain    scoliosis   CKD (chronic kidney disease) stage 3, GFR 30-59 ml/min (HCC)    CVA (cerebral infarction) 10/29/2012   DDD (degenerative disc disease)     Diverticulosis    GERD (gastroesophageal reflux disease) 09/01/2010   Hemorrhoids    Herniated nucleus pulposus, L5-S1, right 11/04/2015   Hyperlipidemia    takes Lipitor daily   Hypertension    takes Amlodipine,Losartan,Metoprolol,and HCTZ daily   Incontinence of urine    Insomnia    takes Restoril nightly   Lumbar stenosis 04/25/2013   Major depressive disorder, recurrent episode, moderate ([place]) 07/16/2013   Osteoporosis    PAF (paroxysmal atrial fibrillation) ([place]) 2011   a. lone epidode in 2011 according to notes.   Rheumatoid arthritis ([place])    Scoliosis    Sepsis ([place]) 05/2019   Stroke ([place])    Temporal arteritis ([place]) 2011  a. followed @ [facility]; potential flareup 10/28/2012/notes 10/28/2012   Vocal cord dysfunction    "they don't operate properly" (10/28/2012)    Medications:  (Not in a hospital admission) Scheduled:   fentaNYL  1 patch Transdermal Q72H   ipratropium-albuterol  3 mL Nebulization Q6H   methylPREDNISolone (SOLU-MEDROL) injection  40 mg Intravenous Q12H   pantoprazole (PROTONIX) IV  40 mg Intravenous Q12H   sodium phosphate  1 enema Rectal Once   [START ON 05/24/2022] thiamine (VITAMIN B1) injection  100 mg Intravenous Daily   Infusions:   azithromycin     cefTRIAXone (ROCEPHIN)  IV     dextrose 5 % and 0.45% NaCl 125 mL/hr at 05/21/22 0925   heparin 1,000 Units/hr (05/20/22 2033)   thiamine (VITAMIN B1) injection Stopped (05/21/22 1109)    Assessment: Pt with a hx of AF on apixaban presenting with PNA. Heparin ordered for bridging. Her last apixaban dose was at 2100 on 2/20. We will use PTT for now until correlation.   Initial aPTT >200; however, per discussion with RN, this may be lab error and will redraw stat to confirm. Heparin level elevated, as expected, due to influence of apixaban. CBC WNL. No bleed issues reported.  Update: repeat aPTT once again >200. RN reports no s/sx bleeding.   Goal of Therapy:  Heparin level 0.3-0.7 units/ml aPTT  66-102 seconds Monitor platelets by anticoagulation protocol: Yes   Plan:  Hold heparin for 1 hour Resume heparin at 750 units/hr Check aPTT @ 2000  Monitor daily aPTT/heparin level until correlating, CBC, s/sx bleeding  Eliseo Gum, PharmD PGY1 Pharmacy Resident   05/21/2022  1:09 PM

## 2022-05-21 NOTE — ED Notes (Signed)
500 bolus started, verbal orders received from Dr. Leonette Monarch to start. Pt still at baseline, family @ bedside.

## 2022-05-21 NOTE — Progress Notes (Signed)
Received a call from bedside RN regarding the patient being lethargic, tachycardic, and hypotensive with systolic blood pressures less than 90 with MAP less than 65.  IV fluid boluses ordered, received total 750 cc LR.  Started on IV fluid maintenance LR 75 cc/h x 1 day.  Blood pressure and heart rate improved with IV fluid hydration.  Presented at bedside, the patient is lethargic, on BiPAP.  Mild rales on lungs auscultation, at bases.  Poor inspiratory effort.  Tachycardic.  She arouses to voices and falls back to sleep.  The patient's daughters x 2 are present at bedside.  Discussed with daughters plan of care.  Agreed to remove fentanyl patch due to acute on chronic hypoxic and hypercarbic respiratory failure, lethargy, and hypotension.  Per the patient's daughter at bedside the patient vomited within the last 24 hours.  The patient was taken off the BiPAP due to risk for aspiration with recent episode of nausea and vomiting.    ABG obtained by RT and revealed pH 7.282/51.5/106/23.  Will continue to closely monitor.  Will reverse fentanyl if no significant improvement in her altered mental status.   Critical care time: 30 minutes.

## 2022-05-21 NOTE — ED Notes (Signed)
ED TO INPATIENT HANDOFF REPORT  ED Nurse Name and Phone #: Richardson Landry [phone]  [place] Name/Age/Gender Betty Jackson 88 y.o. female Room/Bed: 042C/042C  Code Status   Code Status: DNR  Home/SNF/Other Skilled nursing facility Patient oriented to: self Is this baseline? Yes   Triage Complete: Triage complete  Chief Complaint Pneumonia [J18.9]  Triage Note EMS called out for n/v chest pain.  Patient has chronic pain.  Patient is suppose ot be on oxygen at all times 2L [place] but was not so initial oxygen was 88% placed on 4L [place]  Facility reports she is altered and slow to respond but last seen normal was yesterday between 730a-3p.  Took her norco and zofran this am.  Also has fentanyl patch on left shoulder.  CBG 130 HR 75 BP 140/70 96% 4L [place] and received albuterol 57m.     Allergies Allergies  Allergen Reactions   Alendronate Sodium     Suspected cause of severe esophagitis (per EGD 08/05/21)   Dextromethorphan Rash    Level of Care/Admitting Diagnosis ED Disposition     ED Disposition  Admit   Condition  --   [place][[id number]]  Level of Care: Progressive [102]  Admit to Progressive based on following criteria: RESPIRATORY PROBLEMS hypoxemic/hypercapnic respiratory failure that is responsive to NIPPV (BiPAP) or High Flow Nasal Cannula (6-80 lpm). Frequent assessment/intervention, no > Q2 hrs < Q4 hrs, to maintain oxygenation and pulmonary hygiene.  May admit patient to [facility] equivalent level of care is available:: No  Covid Evaluation: Asymptomatic - no recent exposure (last 10 days) testing not required  Diagnosis: Pneumonia [227785]  Admitting Physician: PElodia Florence[[phone] Attending Physician: PCephus Slater ALamonte Sakai[[id number] Certification:: I certify this patient will need inpatient services for at least 2 midnights          B Medical/Surgery History Past Medical History:  Diagnosis Date    Acute respiratory failure with hypoxia ([place] 05/20/2017   Anemia, unspecified 10/28/2012   Anxiety state, unspecified 10/28/2012   CAD (coronary artery disease)    [facility]  b. Cath approx 2009 - nonobs per pt report.   Cataract    immature on the left eye   Chronic insomnia 02/07/2013   Chronic lower back pain    scoliosis   CKD (chronic kidney disease) stage 3, GFR 30-59 ml/min (HCC)    CVA (cerebral infarction) 10/29/2012   DDD (degenerative disc disease)    Diverticulosis    GERD (gastroesophageal reflux disease) 09/01/2010   Hemorrhoids    Herniated nucleus pulposus, L5-S1, right 11/04/2015   Hyperlipidemia    takes Lipitor daily   Hypertension    takes Amlodipine,Losartan,Metoprolol,and HCTZ daily   Incontinence of urine    Insomnia    takes Restoril nightly   Lumbar stenosis 04/25/2013   Major depressive disorder, recurrent episode, moderate ([place] 07/16/2013   Osteoporosis    PAF (paroxysmal atrial fibrillation) ([place] 2011   a. lone epidode in 2011 according to notes.   Rheumatoid arthritis ([place]    Scoliosis    Sepsis ([place] 05/2019   [facility]roke ([facility]    Temporal arteritis ([place] 2011   a. followed @ [place] potential flareup 10/28/2012/notes 10/28/2012   Vocal cord dysfunction    "they don't operate properly" (10/28/2012)   Past Surgical History:  Procedure Laterality Date   ABDOMINAL HYSTERECTOMY  ~ 1984   vaginally  BACK SURGERY  7-54yr ago   X Stop   BLADDER SUSPENSION  2001   BREAST BIOPSY Right    CATARACT EXTRACTION W/ INTRAOCULAR LENS IMPLANT Right ~ 08/2012   CHEST TUBE INSERTION Left 03/08/2019   Procedure: Chest Tube Insertion;  Surgeon: VIvin Poot MD;  [place]  Service: Thoracic;  Laterality: Left;   COLONOSCOPY  01/26/2012   Procedure: COLONOSCOPY;  Surgeon: MLadene Artist MD,FACG;  [facility]  Service: Endoscopy;  Laterality: N/A;  note the EGD is possible   COLONOSCOPY WITH PROPOFOL N/A 09/10/2021    Procedure: COLONOSCOPY WITH PROPOFOL;  Surgeon: GGatha Mayer MD;  Location: [place]  Service: Gastroenterology;  Laterality: N/A;   CORONARY ANGIOPLASTY WITH STENT PLACEMENT  2006   X 1 stent   EPIDURAL BLOCK INJECTION     ESOPHAGOGASTRODUODENOSCOPY  01/26/2012   Procedure: ESOPHAGOGASTRODUODENOSCOPY (EGD);  Surgeon: MLadene Artist MD,FACG;  [facility]  Service: Endoscopy;  Laterality: N/A;   ESOPHAGOGASTRODUODENOSCOPY (EGD) WITH PROPOFOL N/A 08/05/2021   Procedure: ESOPHAGOGASTRODUODENOSCOPY (EGD) WITH PROPOFOL;  Surgeon: DDoran Stabler MD;  Location: [place]  Service: Gastroenterology;  Laterality: N/A;   ESOPHAGOGASTRODUODENOSCOPY (EGD) WITH PROPOFOL N/A 09/10/2021   Procedure: ESOPHAGOGASTRODUODENOSCOPY (EGD) WITH PROPOFOL;  Surgeon: GGatha Mayer MD;  Location: [place]  Service: Gastroenterology;  Laterality: N/A;   FINE NEEDLE ASPIRATION Right 09/26/2019   Procedure: FINE NEEDLE ASPIRATION;  Surgeon: HAltamese Swisher MD;  Location: [place]  Service: Orthopedics;  Laterality: Right;   FOREIGN BODY REMOVAL  08/05/2021   Procedure: FOREIGN BODY REMOVAL;  Surgeon: DDoran Stabler MD;  [facility];  Service: Gastroenterology;;   HEMIARTHROPLASTY HIP Right 2012   IR EPIDUROGRAPHY  04/20/2019   LAPAROSCOPIC CHOLECYSTECTOMY  2001   LUMBAR FUSION  03/2013   LUMBAR LAMINECTOMY/DECOMPRESSION MICRODISCECTOMY Right 11/04/2015   Procedure: Right Lumbar Five-Sacral One Microdiskectomy;  Surgeon: HKristeen Miss MD;  [facility];  Service: Neurosurgery;  Laterality: Right;  Right L5-S1 Microdiskectomy   moles removed that required stiches     one on leg and one on face   ORIF FEMUR FRACTURE Right 09/26/2019   Procedure: OPEN REDUCTION INTERNAL FIXATION (ORIF) DISTAL FEMUR FRACTURE;  Surgeon: HAltamese Nice MD;  [place]  Service: Orthopedics;  Laterality: Right;   ORIF FEMUR FRACTURE Right 10/17/2019   Procedure: OPEN REDUCTION INTERNAL  FIXATION (ORIF) DISTAL FEMUR FRACTURE;  Surgeon: HAltamese  MD;  Location: [place]  Service: Orthopedics;  Laterality: Right;   POLYPECTOMY  09/10/2021   Procedure: POLYPECTOMY;  Surgeon: GGatha Mayer MD;  Location: [place]  Service: Gastroenterology;;   SUBXYPHOID PERICARDIAL WINDOW N/A 03/08/2019   Procedure: SUBXYPHOID PERICARDIAL WINDOW;  Surgeon: VIvin Poot MD;  Location: [place]  Service: Thoracic;  Laterality: N/A;   TEE WITHOUT CARDIOVERSION N/A 03/08/2019   Procedure: TRANSESOPHAGEAL ECHOCARDIOGRAM (TEE);  Surgeon: VPrescott Gum PCollier Salina MD;  Location: [place]  Service: Thoracic;  Laterality: N/A;   TEMPORAL ARTERY BIOPSY / LIGATION Bilateral 2011   TONSILLECTOMY AND ADENOIDECTOMY     at age 88  [place] ~ [place] ~ 2010   "lower back" (10/28/2012)     A IV Location/Drains/Wounds Patient Lines/Drains/Airways Status     Active Line/Drains/Airways     Name Placement date Placement time Site Days   Peripheral IV 05/20/22 20 G 1.25" Left Antecubital 05/20/22  1400  Antecubital  1   Peripheral IV 05/20/22  22 G 1" Left;Anterior Forearm 05/20/22  2259  Forearm  1   Urethral Catheter Nichola Sizer, RN Non-latex 14 Fr. 05/20/22  1730  Non-latex  1   External Urinary Catheter 09/03/21  1930  --  260   Incision (Closed) 09/26/19 Leg Right 09/26/19  1327  -- 968   Incision (Closed) 10/17/19 Leg Right 10/17/19  1904  -- 947   Pressure Injury 10/17/19 Heel Right Stage 2 -  Partial thickness loss of dermis presenting as a shallow open injury with a red, pink wound bed without slough. 10/17/19  2100  -- 947            Intake/Output Last 24 hours  Intake/Output Summary (Last 24 hours) at 05/21/2022 1225 Last data filed at 05/21/2022 1030 Gross per 24 hour  Intake 1000.09 ml  Output 700 ml  Net 300.09 ml    Labs/Imaging Results for orders placed or performed during the hospital encounter of 05/20/22 (from the past 48 hour(s))  Resp panel by  RT-PCR (RSV, Flu A&B, Covid) Anterior Nasal Swab     Status: None   Collection Time: 05/20/22 12:41 PM   Specimen: Anterior Nasal Swab  Result Value Ref Range   SARS Coronavirus 2 by RT PCR NEGATIVE NEGATIVE   Influenza A by PCR NEGATIVE NEGATIVE   Influenza B by PCR NEGATIVE NEGATIVE    Comment: (NOTE) The Xpert Xpress SARS-CoV-2/FLU/RSV plus assay is intended as an aid in the diagnosis of influenza from Nasopharyngeal swab specimens and should not be used as a sole basis for treatment. Nasal washings and aspirates are unacceptable for Xpert Xpress SARS-CoV-2/FLU/RSV testing.  Fact Sheet for Patients: EntrepreneurPulse.com.au  Fact Sheet for Healthcare Providers: IncredibleEmployment.be  This test is not yet approved or cleared by the [place] FDA and has been authorized for detection and/or diagnosis of SARS-CoV-2 by FDA under an Emergency Use Authorization (EUA). This EUA will remain in effect (meaning this test can be used) for the duration of the COVID-19 declaration under Section 564(b)(1) of the Act, 21 U.S.C. section 360bbb-3(b)(1), unless the authorization is terminated or revoked.     Resp Syncytial Virus by PCR NEGATIVE NEGATIVE    Comment: (NOTE) Fact Sheet for Patients: EntrepreneurPulse.com.au  Fact Sheet for Healthcare Providers: IncredibleEmployment.be  This test is not yet approved or cleared by the [place] FDA and has been authorized for detection and/or diagnosis of SARS-CoV-2 by FDA under an Emergency Use Authorization (EUA). This EUA will remain in effect (meaning this test can be used) for the duration of the COVID-19 declaration under Section 564(b)(1) of the Act, 21 U.S.C. section 360bbb-3(b)(1), unless the authorization is terminated or revoked.  Performed at [facility], [place] [address] [place], [place] [address]   Comprehensive metabolic panel     Status:  Abnormal   Collection Time: 05/20/22  1:16 PM  Result Value Ref Range   Sodium 147 (H) 135 - 145 mmol/L   Potassium 3.7 3.5 - 5.1 mmol/L   Chloride 107 98 - 111 mmol/L   CO2 28 22 - 32 mmol/L   Glucose, Bld 123 (H) 70 - 99 mg/dL    Comment: Glucose reference range applies only to samples taken after fasting for at least 8 hours.   BUN 31 (H) 8 - 23 mg/dL   Creatinine, Ser 1.65 (H) 0.44 - 1.00 mg/dL   Calcium 9.3 8.9 - 10.3 mg/dL   Total Protein 6.5 6.5 - 8.1 g/dL   Albumin 3.4 (L) 3.5 -  5.0 g/dL   AST 24 15 - 41 U/L   ALT 14 0 - 44 U/L   Alkaline Phosphatase 81 38 - 126 U/L   Total Bilirubin 1.4 (H) 0.3 - 1.2 mg/dL   GFR, Estimated 30 (L) >60 mL/min    Comment: (NOTE) Calculated using the CKD-EPI Creatinine Equation (2021)    Anion gap 12 5 - 15    Comment: Performed at [place] [address] [place], [place] [address]  CBC     Status: Abnormal   Collection Time: 05/20/22  1:16 PM  Result Value Ref Range   WBC 12.9 (H) 4.0 - 10.5 K/uL   RBC 3.98 3.87 - 5.11 MIL/uL   Hemoglobin 12.5 12.0 - 15.0 g/dL   HCT 41.3 36.0 - 46.0 %   MCV 103.8 (H) 80.0 - 100.0 fL   MCH 31.4 26.0 - 34.0 pg   MCHC 30.3 30.0 - 36.0 g/dL   RDW 14.2 11.5 - 15.5 %   Platelets 202 150 - 400 K/uL   nRBC 0.0 0.0 - 0.2 %    Comment: Performed at [facility], [place] [address] [place], [place] [address]  Brain natriuretic peptide     Status: Abnormal   Collection Time: 05/20/22  1:16 PM  Result Value Ref Range   B Natriuretic Peptide 122.1 (H) 0.0 - 100.0 pg/mL    Comment: Performed at [facility] [address] [place], [place] [address]  Troponin I (High Sensitivity)     Status: None   Collection Time: 05/20/22  1:16 PM  Result Value Ref Range   Troponin I (High Sensitivity) 13 <18 ng/L    Comment: (NOTE) Elevated high sensitivity troponin I (hsTnI) values and significant  changes across serial measurements may suggest ACS but many other  chronic and acute conditions are  known to elevate hsTnI results.  Refer to the "Links" section for chest pain algorithms and additional  guidance. Performed at [facility], [place] [address] [place],  [address]   I-stat chem 8, ED (not at [facility], [facility] or [facility])     Status: Abnormal   Collection Time: 05/20/22  2:09 PM  Result Value Ref Range   Sodium 150 (H) 135 - 145 mmol/L   Potassium 3.6 3.5 - 5.1 mmol/L   Chloride 108 98 - 111 mmol/L   BUN 33 (H) 8 - 23 mg/dL   Creatinine, Ser 1.70 (H) 0.44 - 1.00 mg/dL   Glucose, Bld 121 (H) 70 - 99 mg/dL    Comment: Glucose reference range applies only to samples taken after fasting for at least 8 hours.   Calcium, Ion 1.18 1.15 - 1.40 mmol/L   TCO2 29 22 - 32 mmol/L   Hemoglobin 13.9 12.0 - 15.0 g/dL   HCT 41.0 36.0 - 46.0 %  I-Stat venous blood gas, ED     Status: Abnormal   Collection Time: 05/20/22  2:25 PM  Result Value Ref Range   pH, Ven 7.272 7.25 - 7.43   pCO2, Ven 69.5 (H) 44 - 60 mmHg   pO2, Ven 25 (LL) 32 - 45 mmHg   Bicarbonate 32.1 (H) 20.0 - 28.0 mmol/L   TCO2 34 (H) 22 - 32 mmol/L   O2 Saturation 35 %   Acid-Base Excess 3.0 (H) 0.0 - 2.0 mmol/L   Sodium 149 (H) 135 - 145 mmol/L   Potassium 3.6 3.5 - 5.1 mmol/L   Calcium, Ion 1.22 1.15 - 1.40  mmol/L   HCT 40.0 36.0 - 46.0 %   Hemoglobin 13.6 12.0 - 15.0 g/dL   Sample type VENOUS    Comment NOTIFIED PHYSICIAN   Troponin I (High Sensitivity)     Status: None   Collection Time: 05/20/22  2:58 PM  Result Value Ref Range   Troponin I (High Sensitivity) 13 <18 ng/L    Comment: (NOTE) Elevated high sensitivity troponin I (hsTnI) values and significant  changes across serial measurements may suggest ACS but many other  chronic and acute conditions are known to elevate hsTnI results.  Refer to the "Links" section for chest pain algorithms and additional  guidance. Performed at [facility], [place] [address] [place], [place] [address]   Urinalysis, Routine w reflex microscopic -Urine,  Catheterized     Status: None   Collection Time: 05/20/22  4:25 PM  Result Value Ref Range   Color, Urine YELLOW YELLOW   APPearance CLEAR CLEAR   Specific Gravity, Urine 1.010 1.005 - 1.030   pH 5.0 5.0 - 8.0   Glucose, UA NEGATIVE NEGATIVE mg/dL   Hgb urine dipstick NEGATIVE NEGATIVE   Bilirubin Urine NEGATIVE NEGATIVE   Ketones, ur NEGATIVE NEGATIVE mg/dL   Protein, ur NEGATIVE NEGATIVE mg/dL   Nitrite NEGATIVE NEGATIVE   Leukocytes,Ua NEGATIVE NEGATIVE    Comment: Performed at [facility] [address], [place], [place] [address]  I-Stat venous blood gas, [facility] ED, [facility], [facility])     Status: Abnormal   Collection Time: 05/20/22  6:19 PM  Result Value Ref Range   pH, Ven 7.324 7.25 - 7.43   pCO2, Ven 64.3 (H) 44 - 60 mmHg   pO2, Ven 38 32 - 45 mmHg   Bicarbonate 33.5 (H) 20.0 - 28.0 mmol/L   TCO2 35 (H) 22 - 32 mmol/L   O2 Saturation 66 %   Acid-Base Excess 6.0 (H) 0.0 - 2.0 mmol/L   Sodium 148 (H) 135 - 145 mmol/L   Potassium 4.4 3.5 - 5.1 mmol/L   Calcium, Ion 1.19 1.15 - 1.40 mmol/L   HCT 35.0 (L) 36.0 - 46.0 %   Hemoglobin 11.9 (L) 12.0 - 15.0 g/dL   Sample type VENOUS    Comment NOTIFIED PHYSICIAN   Culture, blood (routine x 2) Call MD if unable to obtain prior to antibiotics being given     Status: None (Preliminary result)   Collection Time: 05/20/22 10:38 PM   Specimen: BLOOD RIGHT HAND  Result Value Ref Range   Specimen Description BLOOD RIGHT HAND    Special Requests      BOTTLES DRAWN AEROBIC AND ANAEROBIC Blood Culture results may not be optimal due to an inadequate volume of blood received in culture bottles   Culture      NO GROWTH < 12 HOURS Performed at [facility], [address], [place], [place] [address]    Report Status PENDING   Culture, blood (routine x 2) Call MD if unable to obtain prior to antibiotics being given     Status: None (Preliminary result)   Collection Time: 05/20/22 10:38 PM   Specimen: BLOOD LEFT HAND  Result Value Ref  Range   Specimen Description BLOOD LEFT HAND    Special Requests      BOTTLES DRAWN AEROBIC AND ANAEROBIC Blood Culture results may not be optimal due to an inadequate volume of blood received in culture bottles   Culture      NO GROWTH < 12 HOURS  Performed at [facility], [place] [address], [place], [place] [address]    Report Status PENDING   CBC     Status: Abnormal   Collection Time: 05/20/22 11:10 PM  Result Value Ref Range   WBC 24.7 (H) 4.0 - 10.5 K/uL   RBC 4.05 3.87 - 5.11 MIL/uL   Hemoglobin 12.9 12.0 - 15.0 g/dL   HCT 41.7 36.0 - 46.0 %   MCV 103.0 (H) 80.0 - 100.0 fL   MCH 31.9 26.0 - 34.0 pg   MCHC 30.9 30.0 - 36.0 g/dL   RDW 14.5 11.5 - 15.5 %   Platelets 178 150 - 400 K/uL   nRBC 0.0 0.0 - 0.2 %    Comment: Performed at [facility], [place] [address] [place], [place] [address]  Creatinine, serum     Status: Abnormal   Collection Time: 05/20/22 11:10 PM  Result Value Ref Range   Creatinine, Ser 1.82 (H) 0.44 - 1.00 mg/dL   GFR, Estimated 27 (L) >60 mL/min    Comment: (NOTE) Calculated using the CKD-EPI Creatinine Equation (2021) Performed at [facility] [address], [place], [place] [address]   HIV Antibody (routine testing w rflx)     Status: None   Collection Time: 05/20/22 11:10 PM  Result Value Ref Range   HIV Screen 4th Generation wRfx Non Reactive Non Reactive    Comment: Performed at [facility], [place] [address], [place], [place] [address]  TSH     Status: Abnormal   Collection Time: 05/20/22 11:10 PM  Result Value Ref Range   TSH 10.328 (H) 0.350 - 4.500 uIU/mL    Comment: Performed by a 3rd Generation assay with a functional sensitivity of <=0.01 uIU/mL. Performed at [facility], [place] [address], [place], [place] [address]   I-Stat venous blood gas, ED     Status: Abnormal   Collection Time: 05/20/22 11:31 PM  Result Value Ref Range   pH, Ven 7.415 7.25 - 7.43   pCO2, Ven 40.0 (L) 44 - 60 mmHg   pO2, Ven 68 (H)  32 - 45 mmHg   Bicarbonate 25.7 20.0 - 28.0 mmol/L   TCO2 27 22 - 32 mmol/L   O2 Saturation 94 %   Acid-Base Excess 1.0 0.0 - 2.0 mmol/L   Sodium 148 (H) 135 - 145 mmol/L   Potassium 4.2 3.5 - 5.1 mmol/L   Calcium, Ion 1.09 (L) 1.15 - 1.40 mmol/L   HCT 38.0 36.0 - 46.0 %   Hemoglobin 12.9 12.0 - 15.0 g/dL   Sample type VENOUS   I-Stat arterial blood gas, ED     Status: Abnormal   Collection Time: 05/21/22  1:51 AM  Result Value Ref Range   pH, Arterial 7.282 (L) 7.35 - 7.45   pCO2 arterial 51.5 (H) 32 - 48 mmHg   pO2, Arterial 106 83 - 108 mmHg   Bicarbonate 23.7 20.0 - 28.0 mmol/L   TCO2 25 22 - 32 mmol/L   O2 Saturation 96 %   Acid-base deficit 3.0 (H) 0.0 - 2.0 mmol/L   Sodium 149 (H) 135 - 145 mmol/L   Potassium 3.7 3.5 - 5.1 mmol/L   Calcium, Ion 1.22 1.15 - 1.40 mmol/L   HCT 34.0 (L) 36.0 - 46.0 %   Hemoglobin 11.6 (L) 12.0 - 15.0 g/dL   Patient temperature 102.8 F    Collection site RADIAL, ALLEN'S TEST ACCEPTABLE    Drawn by HIDE  Sample type ARTERIAL   CBG monitoring, ED     Status: Abnormal   Collection Time: 05/21/22  3:53 AM  Result Value Ref Range   Glucose-Capillary 155 (H) 70 - 99 mg/dL    Comment: Glucose reference range applies only to samples taken after fasting for at least 8 hours.  Vitamin B12     Status: None   Collection Time: 05/21/22  6:00 AM  Result Value Ref Range   Vitamin B-12 513 180 - 914 pg/mL    Comment: (NOTE) This assay is not validated for testing neonatal or myeloproliferative syndrome specimens for Vitamin B12 levels. Performed at [facility], [place] [address], [place], [place] [address]   CBC     Status: Abnormal   Collection Time: 05/21/22  6:00 AM  Result Value Ref Range   WBC 28.5 (H) 4.0 - 10.5 K/uL   RBC 3.76 (L) 3.87 - 5.11 MIL/uL   Hemoglobin 11.8 (L) 12.0 - 15.0 g/dL   HCT 39.1 36.0 - 46.0 %   MCV 104.0 (H) 80.0 - 100.0 fL   MCH 31.4 26.0 - 34.0 pg   MCHC 30.2 30.0 - 36.0 g/dL   RDW 14.4 11.5 - 15.5 %    Platelets 189 150 - 400 K/uL   nRBC 0.0 0.0 - 0.2 %    Comment: Performed at [facility], [place] [address], [place], [place] [address]  Comprehensive metabolic panel     Status: Abnormal   Collection Time: 05/21/22  6:00 AM  Result Value Ref Range   Sodium 149 (H) 135 - 145 mmol/L   Potassium 3.8 3.5 - 5.1 mmol/L   Chloride 112 (H) 98 - 111 mmol/L   CO2 22 22 - 32 mmol/L   Glucose, Bld 173 (H) 70 - 99 mg/dL    Comment: Glucose reference range applies only to samples taken after fasting for at least 8 hours.   BUN 31 (H) 8 - 23 mg/dL   Creatinine, Ser 2.30 (H) 0.44 - 1.00 mg/dL   Calcium 8.6 (L) 8.9 - 10.3 mg/dL   Total Protein 5.7 (L) 6.5 - 8.1 g/dL   Albumin 2.7 (L) 3.5 - 5.0 g/dL   AST 41 15 - 41 U/L   ALT 19 0 - 44 U/L   Alkaline Phosphatase 105 38 - 126 U/L   Total Bilirubin 1.0 0.3 - 1.2 mg/dL   GFR, Estimated 20 (L) >60 mL/min    Comment: (NOTE) Calculated using the CKD-EPI Creatinine Equation (2021)    Anion gap 15 5 - 15    Comment: Performed at [facility], [place] [address] [place], [place] [address]  Folate     Status: None   Collection Time: 05/21/22  6:00 AM  Result Value Ref Range   Folate 19.8 >5.9 ng/mL    Comment: Performed at [place] [address] [place],  [address]  I-Stat venous blood gas, ED     Status: Abnormal   Collection Time: 05/21/22  6:09 AM  Result Value Ref Range   pH, Ven 7.339 7.25 - 7.43   pCO2, Ven 40.9 (L) 44 - 60 mmHg   pO2, Ven 55 (H) 32 - 45 mmHg   Bicarbonate 22.0 20.0 - 28.0 mmol/L   TCO2 23 22 - 32 mmol/L   O2 Saturation 86 %   Acid-base deficit 4.0 (H) 0.0 - 2.0 mmol/L   Sodium 150 (H) 135 - 145 mmol/L   Potassium 3.8 3.5 -  5.1 mmol/L   Calcium, Ion 1.10 (L) 1.15 - 1.40 mmol/L   HCT 36.0 36.0 - 46.0 %   Hemoglobin 12.2 12.0 - 15.0 g/dL   Sample type VENOUS   Heparin level (unfractionated)     Status: Abnormal   Collection Time: 05/21/22  8:43 AM  Result Value Ref Range   Heparin Unfractionated  >1.10 (H) 0.30 - 0.70 IU/mL    Comment: (NOTE) The clinical reportable range upper limit is being lowered to >1.10 to align with the FDA approved guidance for the current laboratory assay.  If heparin results are below expected values, and patient dosage has  been confirmed, suggest follow up testing of antithrombin III levels. Performed at [facility], [place] [address], [place], [place] [address]   APTT     Status: Abnormal   Collection Time: 05/21/22  8:43 AM  Result Value Ref Range   aPTT >200 (HH) 24 - 36 seconds    Comment:        IF BASELINE aPTT IS ELEVATED, SUGGEST PATIENT RISK ASSESSMENT BE USED TO DETERMINE APPROPRIATE ANTICOAGULANT THERAPY. REPEATED TO VERIFY CRITICAL RESULT CALLED TO, READ BACK BY AND VERIFIED WITH: Jerel Shepherd, RN [phone] 05/21/22 L. KLAR Performed at [facility], [place] [address] [place], [place] [address]    *Note: Due to a large number of results and/or encounters for the requested time period, some results have not been displayed. A complete set of results can be found in Results Review.   DG CHEST PORT 1 VIEW  Result Date: 05/21/2022 CLINICAL DATA:  88 year old female with chest pain, hypoxia, vomiting. EXAM: PORTABLE CHEST 1 VIEW COMPARISON:  CT Chest, Abdomen, and Pelvis yesterday, and earlier. FINDINGS: Portable AP semi upright view at 0400 hours. Continued low lung volumes. Stable mediastinal contours. Ongoing curvilinear opacity at the lung bases left greater than right, not significantly changed from the chest CT yesterday. No pneumothorax, pulmonary edema, or pleural effusion identified. Visualized tracheal air column is within normal limits. Paucity of bowel gas in the visible abdomen. Stable cholecystectomy clips. Stable visualized osseous structures. IMPRESSION: 1. Continued low lung volumes and bibasilar lung opacity, stable from the chest CT yesterday. Appearance favors atelectasis, but cannot exclude lung base infection. 2. No new  cardiopulmonary abnormality identified. Electronically Signed   By: Genevie Ann M.D.   On: 05/21/2022 04:07   CT CHEST ABDOMEN PELVIS WO CONTRAST  Result Date: 05/20/2022 CLINICAL DATA:  Abdominal pain, acute, nonlocalized.  Chest pain. EXAM: CT CHEST, ABDOMEN AND PELVIS WITHOUT CONTRAST TECHNIQUE: Multidetector CT imaging of the chest, abdomen and pelvis was performed following the standard protocol without IV contrast. RADIATION DOSE REDUCTION: This exam was performed according to the departmental dose-optimization program which includes automated exposure control, adjustment of the mA and/or kV according to patient size and/or use of iterative reconstruction technique. COMPARISON:  Chest CT 09/04/2021.  CT abdomen 07/17/2020. FINDINGS: CT CHEST FINDINGS Cardiovascular: Mild cardiomegaly. Coronary artery calcification and aortic atherosclerotic calcification. Mediastinum/Nodes: No mediastinal or hilar mass or lymphadenopathy. Lungs/Pleura: No pleural effusion. Patchy density at both lung bases left more than right could be scarring or bibasilar pneumonia. Lung apices show benign pleural and parenchymal scarring. Musculoskeletal: Old augmented compression fracture at T11 without further collapse. Chronic superior endplate fracture at T7 with loss of height of 30%. No progressive collapse. No new fracture. CT ABDOMEN PELVIS FINDINGS Hepatobiliary: No liver lesion seen on this study done without contrast. Previous cholecystectomy. Mild chronic ductal prominence. Pancreas: Motion degraded.  No abnormality seen. Spleen: Negative Adrenals/Urinary Tract: Adrenal glands are normal. No acute renal finding. No hydronephrosis. Bladder appears unremarkable. Stomach/Bowel: Stomach and small intestine are unremarkable. Normal appendix. Diverticulosis of the colon without visible diverticulitis. Rectal fecal impaction. Vascular/Lymphatic: Aortic atherosclerosis. No aneurysm. IVC is normal. No adenopathy. Reproductive: Previous  hysterectomy.  No pelvic mass. Other: No free fluid or air. Musculoskeletal: No acute lumbar spine or pelvic abnormality. Old vertebral augmentation at L1. Previous decompression and fusion L2 through L4. IMPRESSION: 1. Patchy density at both lung bases left more than right could be scarring or bibasilar pneumonia. 2. Cardiomegaly. Coronary artery calcification. Aortic atherosclerosis. 3. No acute or traumatic finding in the abdomen or pelvis. 4. Rectal fecal impaction. 5. Diverticulosis without visible diverticulitis. 6. Old augmented compression fracture at T11 without further collapse. Chronic superior endplate fracture at T7 with loss of height of 30%. No progressive collapse. No new fracture. Old vertebral augmentation at L1. Previous decompression and fusion L2 through L4. Aortic Atherosclerosis (ICD10-I70.0). Electronically Signed   By: Nelson Chimes M.D.   On: 05/20/2022 16:10   CT Head Wo Contrast  Result Date: 05/20/2022 CLINICAL DATA:  Altered mental status EXAM: CT HEAD WITHOUT CONTRAST TECHNIQUE: Contiguous axial images were obtained from the base of the skull through the vertex without intravenous contrast. RADIATION DOSE REDUCTION: This exam was performed according to the departmental dose-optimization program which includes automated exposure control, adjustment of the mA and/or kV according to patient size and/or use of iterative reconstruction technique. COMPARISON:  04/30/21 CT head FINDINGS: Brain: No evidence of acute infarction, hemorrhage, hydrocephalus, extra-axial collection or mass lesion/mass effect. Sequela of severe chronic microvascular ischemic change, unchanged from prior exam. Vascular: No hyperdense vessel. Redemonstrated calcified atherosclerotic plaque in the proximal left M1. Skull: Normal. Negative for fracture or focal lesion. Sinuses/Orbits: Bilateral lens replacement. Trace left mastoid effusion. No middle ear effusion. Paranasal sinuses are clear. Other: There is a pannus  at the craniocervical junction. IMPRESSION: No acute intracranial abnormality. Electronically Signed   By: Marin Roberts M.D.   On: 05/20/2022 16:04   DG Chest Port 1 View  Result Date: 05/20/2022 CLINICAL DATA:  Chest pain, emesis and shortness of breath. EXAM: PORTABLE CHEST 1 VIEW COMPARISON:  09/03/2021; 08/06/2021; chest CT-09/04/2021 FINDINGS: Grossly unchanged cardiac silhouette and mediastinal contours. Grossly unchanged bibasilar heterogeneous opacities, left-greater-than-right. No new focal airspace opacities. No pleural effusion or pneumothorax. No evidence of edema. No acute osseous abnormalities. Degenerative change of the bilateral glenohumeral joints is suspected though incompletely evaluated. Postcholecystectomy. IMPRESSION: Similar findings of bibasilar atelectasis without superimposed acute cardiopulmonary disease on this AP portable examination. Electronically Signed   By: Sandi Mariscal M.D.   On: 05/20/2022 13:14    Pending Labs Unresulted Labs (From admission, onward)     Start     Ordered   05/22/22 0500  Heparin level (unfractionated)  Daily,   R      05/20/22 2002   05/22/22 0500  CBC  Daily,   R      05/20/22 2002   05/22/22 0500  APTT  Daily,   R      05/21/22 0750   05/22/22 0500  CBC  Daily,   R      05/21/22 0750   05/22/22 0500  Heparin level (unfractionated)  Daily,   R      05/21/22 0750   05/22/22 0500  CBC with Differential/Platelet  Tomorrow morning,   R        05/21/22 0950  05/22/22 0500  Comprehensive metabolic panel  Tomorrow morning,   R        05/21/22 0950   05/22/22 0500  Magnesium  Tomorrow morning,   R        05/21/22 0950   05/22/22 0500  Phosphorus  Tomorrow morning,   R        05/21/22 0950   05/21/22 [id number]  Basic metabolic panel  Once,   R        05/21/22 0950   05/21/22 1140  APTT  ONCE - STAT,   STAT        05/21/22 1140   05/21/22 0951  Ammonia  Add-on,   AD        05/21/22 0950   05/21/22 0929  T4, free  Add-on,   AD         05/21/22 0928   05/21/22 0500  Vitamin B1  Tomorrow morning,   R        05/20/22 1925   05/20/22 2219  Expectorated Sputum Assessment w Gram Stain, Rflx to Resp Cult  Once,   R        05/20/22 2218   05/20/22 1920  MRSA Next Gen by PCR, Nasal  Once,   R        05/20/22 1919   05/20/22 1633  Respiratory (~20 pathogens) panel by PCR  (Respiratory panel by PCR (~20 pathogens, ~24 hr TAT)  w precautions)  Add-on,   AD        05/20/22 1632            Vitals/Pain Today's Vitals   05/21/22 0720 05/21/22 1100 05/21/22 1105 05/21/22 1130  BP: (!) 110/55 (!) 95/50  (!) 99/51  Pulse: 87 81  81  Resp: 17 15  15  $ Temp:   98.5 F (36.9 C)   TempSrc:   Axillary   SpO2: 97% 98%  97%  Weight:      Height:      PainSc:        Isolation Precautions No active isolations  Medications Medications  sodium phosphate (FLEET) 7-19 GM/118ML enema 1 enema (0 enemas Rectal Hold 05/20/22 1940)  ipratropium-albuterol (DUONEB) 0.5-2.5 (3) MG/3ML nebulizer solution 3 mL (3 mLs Nebulization Given 05/21/22 0918)  hydrocortisone (ANUSOL-HC) 2.5 % rectal cream 1 Application (has no administration in time range)  methylPREDNISolone sodium succinate (SOLU-MEDROL) 40 mg/mL injection 40 mg (40 mg Intravenous Given 05/21/22 0918)  pantoprazole (PROTONIX) injection 40 mg (40 mg Intravenous Given 05/21/22 0918)  albuterol (PROVENTIL) (2.5 MG/3ML) 0.083% nebulizer solution 2.5 mg (has no administration in time range)  heparin ADULT infusion 100 units/mL (25000 units/238m) (1,000 Units/hr Intravenous New Bag/Given 05/20/22 2033)  cefTRIAXone (ROCEPHIN) 2 g in sodium chloride 0.9 % 100 mL IVPB (has no administration in time range)  azithromycin (ZITHROMAX) 500 mg in sodium chloride 0.9 % 250 mL IVPB (has no administration in time range)  acetaminophen (TYLENOL) tablet 650 mg ( Oral See Alternative 05/20/22 2240)    Or  acetaminophen (TYLENOL) suppository 650 mg (650 mg Rectal Given 05/20/22 2240)  fentaNYL (DURAGESIC) 12  MCG/HR 1 patch (1 patch Transdermal Patch Applied 05/21/22 0520)  fentaNYL (SUBLIMAZE) injection 12.5 mcg (12.5 mcg Intravenous Given 05/21/22 0717)  dextrose 5 %-0.45 % sodium chloride infusion ( Intravenous New Bag/Given 05/21/22 0925)  thiamine (VITAMIN B1) 500 mg in sodium chloride 0.9 % 50 mL IVPB (0 mg Intravenous Stopped 05/21/22 1109)    Followed by  thiamine (VITAMIN B1) injection  100 mg (has no administration in time range)  lactated ringers bolus 500 mL (0 mLs Intravenous Stopped 05/20/22 1718)  cefTRIAXone (ROCEPHIN) 2 g in sodium chloride 0.9 % 100 mL IVPB (0 g Intravenous Stopped 05/20/22 1754)  azithromycin (ZITHROMAX) 500 mg in sodium chloride 0.9 % 250 mL IVPB (0 mg Intravenous Stopped 05/20/22 2024)  ondansetron (ZOFRAN) injection 4 mg (4 mg Intravenous Given 05/20/22 1718)  lactated ringers bolus 500 mL (0 mLs Intravenous Stopped 05/20/22 1755)  sodium chloride 0.9 % bolus 250 mL (0 mLs Intravenous Stopped 05/21/22 0019)  sodium chloride 0.9 % bolus 500 mL (0 mLs Intravenous Stopped 05/21/22 0018)  naloxone (NARCAN) injection 0.4 mg (0.4 mg Intravenous Given 05/21/22 0400)  fentaNYL (SUBLIMAZE) injection 12.5 mcg (12.5 mcg Intravenous Given 05/21/22 0450)  sodium chloride 0.9 % bolus 250 mL (0 mLs Intravenous Stopped 05/21/22 1109)    Mobility non-ambulatory     Focused Assessments Cardiac Assessment Handoff:  Cardiac Rhythm: Normal sinus rhythm Lab Results  Component Value Date   CKTOTAL 78 04/15/2019   TROPONINI <0.30 12/24/2011   No results found for: "DDIMER" Does the Patient currently have chest pain? No   , Renal Assessment Handoff:  Has AKI  , Pulmonary Assessment Handoff:  Lung sounds: Bilateral Breath Sounds: Diminished L Breath Sounds: Diminished R Breath Sounds: Diminished O2 Device: Nasal Cannula O2 Flow Rate (L/min): 3 L/min    R Recommendations: See Admitting Provider Note  Report given to:   Additional Notes:

## 2022-05-21 NOTE — Progress Notes (Addendum)
ANTICOAGULATION CONSULT NOTE  Pharmacy Consult for heparin Indication: atrial fibrillation  Allergies  Allergen Reactions   Alendronate Sodium     Suspected cause of severe esophagitis (per EGD 08/05/21)   Dextromethorphan Rash    Patient Measurements: Height: 5' 4"$  (162.6 cm) Weight: 78 kg (172 lb) IBW/kg (Calculated) : 54.7 Heparin Dosing Weight: 71 kg  Vital Signs: Temp: 99.3 F (37.4 C) (02/22 2046) Temp Source: Axillary (02/22 2046) BP: 105/66 (02/22 2046) Pulse Rate: 82 (02/22 2046)  Labs: Recent Labs    05/20/22 1316 05/20/22 1409 05/20/22 1458 05/20/22 1819 05/20/22 2310 05/20/22 2331 05/21/22 0151 05/21/22 0600 05/21/22 0609 05/21/22 0843 05/21/22 1146 05/21/22 1519 05/21/22 2029  HGB 12.5   < >  --    < > 12.9   < > 11.6* 11.8* 12.2  --   --   --   --   HCT 41.3   < >  --    < > 41.7   < > 34.0* 39.1 36.0  --   --   --   --   PLT 202  --   --   --  178  --   --  189  --   --   --   --   --   APTT  --   --   --   --   --   --   --   --   --  >200* >200*  --  112*  HEPARINUNFRC  --   --   --   --   --   --   --   --   --  >1.10*  --   --   --   CREATININE 1.65*   < >  --   --  1.82*  --   --  2.30*  --   --   --  2.58*  --   TROPONINIHS 13  --  13  --   --   --   --   --   --   --   --   --   --    < > = values in this interval not displayed.     Estimated Creatinine Clearance: 16.1 mL/min (A) (by C-G formula based on SCr of 2.58 mg/dL (H)).   Medical History: Past Medical History:  Diagnosis Date   Acute respiratory failure with hypoxia ([place]) 05/20/2017   Anemia, unspecified 10/28/2012   Anxiety state, unspecified 10/28/2012   CAD (coronary artery disease)    a. Stent RCA in [facility];  b. Cath approx 2009 - nonobs per pt report.   Cataract    immature on the left eye   Chronic insomnia 02/07/2013   Chronic lower back pain    scoliosis   CKD (chronic kidney disease) stage 3, GFR 30-59 ml/min (HCC)    CVA (cerebral infarction)  10/29/2012   DDD (degenerative disc disease)    Diverticulosis    GERD (gastroesophageal reflux disease) 09/01/2010   Hemorrhoids    Herniated nucleus pulposus, L5-S1, right 11/04/2015   Hyperlipidemia    takes Lipitor daily   Hypertension    takes Amlodipine,Losartan,Metoprolol,and HCTZ daily   Incontinence of urine    Insomnia    takes Restoril nightly   Lumbar stenosis 04/25/2013   Major depressive disorder, recurrent episode, moderate ([place]) 07/16/2013   Osteoporosis    PAF (paroxysmal atrial fibrillation) ([place]) 2011   a. lone epidode in 2011 according to notes.  Rheumatoid arthritis ([place])    Scoliosis    Sepsis ([place]) 05/2019   Stroke [facility])    Temporal arteritis ([place]) 2011   a. followed @ [place]; potential flareup 10/28/2012/notes 10/28/2012   Vocal cord dysfunction    "they don't operate properly" (10/28/2012)    Medications:  Medications Prior to Admission  Medication Sig Dispense Refill Last Dose   acetaminophen (TYLENOL) 500 MG tablet Take 500 mg by mouth every 6 (six) hours as needed for mild pain.   unknown   allopurinol (ZYLOPRIM) 100 MG tablet Take 100 mg by mouth daily.   05/19/2022   amiodarone (PACERONE) 200 MG tablet Take 273m twice daily for 5 days, then take 2077monce daily. (Patient taking differently: Take 200 mg by mouth daily.)   05/19/2022   atorvastatin (LIPITOR) 80 MG tablet Take 1 tablet (80 mg total) by mouth daily. (Patient taking differently: Take 80 mg by mouth at bedtime.) 30 tablet 3 05/19/2022   cetirizine (ZYRTEC) 10 MG tablet Take 10 mg by mouth at bedtime.   05/19/2022   cholecalciferol (VITAMIN D) 25 MCG tablet Take 2 tablets (2,000 Units total) by mouth 2 (two) times daily.   05/19/2022   diltiazem (CARDIZEM CD) 240 MG 24 hr capsule Take 240 mg by mouth every evening.   05/19/2022   ELIQUIS 5 MG TABS tablet TAKE 1 TABLET BY MOUTH TWICE A DAY (Patient taking differently: Take 5 mg by mouth 2 (two) times daily.) 60 tablet 5 05/19/2022 at 2100    escitalopram (LEXAPRO) 10 MG tablet TAKE 1 TABLET BY MOUTH EVERY DAY (Patient taking differently: Take 10 mg by mouth daily.) 90 tablet 1 05/19/2022   fentaNYL 37.5 MCG/HR PT72 Place 1 patch onto the skin every 3 (three) days. 10 patch 0 05/19/2022   ferrous sulfate 325 (65 FE) MG tablet Take 1 tablet (325 mg total) by mouth 2 (two) times daily with a meal. 60 tablet 3 05/19/2022   furosemide (LASIX) 40 MG tablet Take 40 mg by mouth daily.   05/19/2022   furosemide (LASIX) 40 MG tablet Take 1 tablet (40 mg total) by mouth daily as needed (extra dose, for weight gain of 2 pounds overnight or 5 pound in 1 week).   unknown   HYDROcodone-acetaminophen (NORCO) 5-325 MG tablet Take 1 tablet by mouth every 12 (twelve) hours as needed for severe pain. 10 tablet 0 05/20/2022   ipratropium-albuterol (DUONEB) 0.5-2.5 (3) MG/3ML SOLN Take 3 mLs by nebulization every 4 (four) hours as needed. (Patient taking differently: Take 3 mLs by nebulization every 4 (four) hours as needed (wheezing/shortness of breath).) 360 mL  unknown   meloxicam (MOBIC) 7.5 MG tablet Take 7.5 mg by mouth daily.   05/19/2022   metoprolol tartrate (LOPRESSOR) 100 MG tablet Take 100 mg by mouth 2 (two) times daily.   05/19/2022 at 1700   Multiple Vitamin (MULTIVITAMIN WITH MINERALS) TABS tablet Take 1 tablet by mouth daily. 60 tablet 0 05/19/2022   nitroGLYCERIN (NITROSTAT) 0.4 MG SL tablet Place 1 tablet (0.4 mg total) under the tongue every 5 (five) minutes as needed for chest pain. 25 tablet 3 05/20/2022   Nutritional Supplement LIQD Take 120 mLs by mouth daily. House stock supplement   05/19/2022   ondansetron (ZOFRAN) 4 MG tablet Take 4 mg by mouth every 8 (eight) hours as needed for nausea or vomiting.   05/20/2022   OXYGEN Inhale 2 L/min into the lungs as needed (maintain SATS above 90%).   05/19/2022   pantoprazole (  PROTONIX) 40 MG tablet Take 1 tablet (40 mg total) by mouth 2 (two) times daily.   05/19/2022   polyethylene glycol (MIRALAX) 17 g  packet Take 17 g by mouth daily. (Patient taking differently: Take 17 g by mouth daily as needed for mild constipation.) 28 each 2 unknown   potassium chloride (KLOR-CON M) 10 MEQ tablet Take 3 tablets (30 mEq total) by mouth daily.   05/19/2022   Pramox-PE-Glycerin-Petrolatum (HEMORRHOIDAL MAX STR) 1-0.25-14.4-15 % CREA Apply 1 Application topically 2 (two) times daily as needed (hemorrhoidal pain/discomfort.). 25.5 g 0 unknown   predniSONE (DELTASONE) 10 MG tablet Take 1 tablet (10 mg total) by mouth daily with breakfast. Resume after 3 days of 18m daily as instructed. 30 tablet 0 05/17/2022   QUEtiapine (SEROQUEL) 25 MG tablet Take 1 tablet (25 mg total) by mouth at bedtime. 30 tablet 0 05/19/2022   sulfaSALAzine (AZULFIDINE) 500 MG EC tablet Take 500 mg by mouth daily.   05/19/2022   Scheduled:   Chlorhexidine Gluconate Cloth  6 each Topical Daily   fentaNYL  1 patch Transdermal Q72H   methylPREDNISolone (SOLU-MEDROL) injection  40 mg Intravenous Q12H   naloxone       pantoprazole (PROTONIX) IV  40 mg Intravenous Q12H   sodium phosphate  1 enema Rectal Once   [START ON 05/24/2022] thiamine (VITAMIN B1) injection  100 mg Intravenous Daily   Infusions:   azithromycin     cefTRIAXone (ROCEPHIN)  IV 2 g (05/21/22 1656)   dextrose 5 % and 0.45% NaCl 125 mL/hr at 05/21/22 0925   heparin 750 Units/hr (05/21/22 1428)   thiamine (VITAMIN B1) injection Stopped (05/21/22 1109)    Assessment: Pt with a hx of AF on apixaban presenting with PNA. Heparin ordered for bridging. Her last apixaban dose was at 2100 on 2/20. We will use PTT for now until correlation.   Initial aPTT >200; repeated so will assume supra-therapeutic is real. Heparin level elevated, as expected, due to influence of apixaban.   Repeat aPTT this evening after holding and reducing infusion rate to 750 units/hr is 112.  Hgb 12.2; plt 189 No bleed issues reported.  Goal of Therapy:  Heparin level 0.3-0.7 units/ml aPTT 66-102  seconds Monitor platelets by anticoagulation protocol: Yes   Plan:  Decrease heparin infusion to 650 units/hr Check aPTT @ 0600 Monitor daily aPTT/heparin level until correlating, CBC, s/sx bleeding  JLorelei Pont PharmD, BCPS 05/21/2022 9:50 PM ED Clinical Pharmacist -  [phone]

## 2022-05-21 NOTE — Progress Notes (Addendum)
PROGRESS NOTE    Betty Jackson  [id number] DOB: Aug 14, 1934 DOA: 05/20/2022 PCP: Alvester Morin, MD  Chief Complaint  Patient presents with   Chest Pain   Emesis    Brief Narrative:   Betty Jackson is Betty Jackson 88 y.o. female with medical history significant of chronic pain on chronic opiates, atrial fib (on eliquis), RA and temporal arteritis (on chronic steroids) and other medical issues here with confusion.   She's been admitted with acute metabolic encephalopathy and hypoxia.  She has evidence of pneumonia on chest CT.  Currently being treated for acute hypoxic and hypercarbic respiratory failure due to pneumonia.  Also has encephalopathy related to respiratory failure with contribution from opiates.  Currently providing supportive care.  Complex situation as noted below and in prior notes.    Assessment & Plan:   Principal Problem:   Pneumonia  Addendum:  Seen this PM, awake, looks better.  Speech is difficult to understand, but can understand some.  Will start clear diet and see if SLP can see her.  Will continue supportive care as noted below.  Granddaughter Betty Jackson at bedside.  Goals of care Events of last night noted.  She currently has orders for comfort measures, but on discussion with family, they aren't ready for this quite yet (order discontinued).  Would like to see how she does with continued supportive care.  We discussed at length and they understand the difficult nature of balancing their mother's opiates/pain control/encephalopathy/respiratory issues.  Will continue lower dose fentanyl patch for now with prn IV pushes for breakthrough pain.  Discussed our next steps will be guided by how she does with supportive care.  If she's able to wake up and participate with care, eat/drink, etc, that's Monic Engelmann signal to [place] to continue supportive care.  If she's requiring frequent pain meds to the point that she's unable to wake up (or she has persistent/worsening encephalopathy  delirium), then transition to comfort measures/hospice would be appropriate.  I did share with the family that I suspect her prognosis is poor and her chances of recovery are slim (encouraged them to let family know they should visit).  No narcan, family concerned for her comfort.  Palliative care c/s, appreciate recs  Hypotension  Tachycardia Due to sepsis below Seems to have improved with IVF (received 1 L at presentation and additional 750 cc overnight) Continue IVF  Acute Hypercarbic and Hypoxic Respiratory Failure  Due to pneumonia, opiates/respiratory depression, encephalopathy Treating pna with abx VBG shows improved CO2, resolved respiratory acidosis.  Hold further bipap for now (bipap stopped last night due to lethargy and hx vomiting).  Consider use going forward based on overall clinical course.   Sepsis due to Community Acquired Pneumonia  CT chest abdomen pelvis with patchy density at both lung bases L>R (scarring vs pneumonia) 2/22 CXR with low lung volumes and bibasilar opacity Requiring 5 L at this time (greater than chronic home 2 L prn).  Worsening leukocytosis today (sepsis/infection + steroids).  Encephalopathy.  Fits criteria for sepsis with fever, leukocytosis, tachycardia, tachypnea, encephalopathy.  Continue ceftriaxone/azithromycin at this time Blood cultures pending, sputum cultures if able MRSA PCR RVP pending.  Negative covid, influenza, RSV Given her chronic steroid use at home, we've transitioned to IV steroids for her acute infection   Acute Metabolic Encephalopathy Multifactorial -> pneumonia, hypercarbic resp failure, dehydration/AKI/hypernatremia, opiates/sedation --- suspect related to infection + developing AKI + opiates (there was thought that her norco dose was increased, but this doesn't seem to  be the case) Follow b12 (wnl), folate (wnl), B1 (pending), VBG (resolved respiratory acidosis), ammonia (pending collection), TSH (elevated - follow free  T4) Head CT without acute intracranial abnormality Received narcan last night, became restless after that.  Family notes that this was rough to see her like that.  No narcan going forward. Overall, she's more responsive to voice than yesterday, but still lethargic, didn't really follow commands for me today.  We're continuing fentanyl patch and prn pushes as needed for pain based on family preference.  Will try to limit pushes to see if she's able to come around and participate with care, show signs of improvement (eating, drinking, etc).  If encephalopathy is persistent or worsening, that is Betty Jackson signal to [place] that we should consider transition to comfort measures/hospice.   Acute Kidney Injury  Dehydration  Hypernatremia Suspect this represents volume depletion and dehydration in setting of her encephalopathy over the last few days Worsening today.  Continue IVF, will try with D5 1/2 NS.  Bolus 250 cc this AM.  UA bland CT without hydro  Continue to trend   Nausea  Vomiting  Chest Pain  Abdominal Discomfort Suspect related to pneumonia Negative troponins Follow, seems improved   Atrial Fibrillation Chadsvasc 6 Holding eliquis for now, heparin gtt while on hold Holding amiodarone and metoprolol as well as diltiazem while she's NPO If issues with rate, can start dilt gtt (I'd ordered dilt gtt with tachy last night, d/c'd with hypotension)   Fecal Impaction Enema ordered by EDP, follow   Chronic Pain Reduced dose fentanyl patch Hold norco Hold meloxicam while unable to take PO Family concerned about holding meds and prior issues with withdrawal, delirium, precipitated RVR, etc (discussed need to hold meds for now given current encephalopathy)   Rheumatoid Arthritis  Hx Temporal Arteritis Hold sulfasalazine IV steroids while unable to take PO   Gout Holding allopurinol while unable to take PO   HLD  CAD Holding lipitor Eliquis on hold while NPO     DVT prophylaxis:  heparin gtt Code Status: DNR/DNI Family Communication: Samuel Jester Disposition:   Status is: Inpatient Remains inpatient appropriate because: continued need for inpatient care   Consultants:  Palliative care  Procedures:  none  Antimicrobials:  Anti-infectives (From admission, onward)    Start     Dose/Rate Route Frequency Ordered Stop   05/21/22 2000  azithromycin (ZITHROMAX) 500 mg in sodium chloride 0.9 % 250 mL IVPB        500 mg 250 mL/hr over 60 Minutes Intravenous Every 24 hours 05/20/22 2223 05/26/22 1959   05/21/22 1700  cefTRIAXone (ROCEPHIN) 2 g in sodium chloride 0.9 % 100 mL IVPB        2 g 200 mL/hr over 30 Minutes Intravenous Every 24 hours 05/20/22 2223 05/26/22 1659   05/20/22 2230  cefTRIAXone (ROCEPHIN) 2 g in sodium chloride 0.9 % 100 mL IVPB  Status:  Discontinued        2 g 200 mL/hr over 30 Minutes Intravenous Every 24 hours 05/20/22 2218 05/20/22 2221   05/20/22 2230  azithromycin (ZITHROMAX) 500 mg in sodium chloride 0.9 % 250 mL IVPB  Status:  Discontinued        500 mg 250 mL/hr over 60 Minutes Intravenous Every 24 hours 05/20/22 2218 05/20/22 2222   05/20/22 1630  cefTRIAXone (ROCEPHIN) 2 g in sodium chloride 0.9 % 100 mL IVPB        2 g 200 mL/hr over 30 Minutes Intravenous  Once 05/20/22 1619 05/20/22 1754   05/20/22 1630  azithromycin (ZITHROMAX) 500 mg in sodium chloride 0.9 % 250 mL IVPB        500 mg 250 mL/hr over 60 Minutes Intravenous  Once 05/20/22 1619 05/20/22 2024       Subjective: Lethargic, unable to participate in interview Discussed with daughters at bedside  Objective: Vitals:   05/21/22 0215 05/21/22 0245 05/21/22 0707 05/21/22 0720  BP: (!) 93/49   (!) 110/55  Pulse: 95   87  Resp: (!) 22   17  Temp:  99.1 F (37.3 C) 98.7 F (37.1 C)   TempSrc:  Axillary Axillary   SpO2: 95%   97%  Weight:      Height:        Intake/Output Summary (Last 24 hours) at 05/21/2022 0856 Last data filed at 05/21/2022 0019 Gross  per 24 hour  Intake 1000.09 ml  Output --  Net 1000.09 ml   Filed Weights   05/20/22 1237  Weight: 78 kg    Examination:  General: ill appearing 88 yo female  Cardiovascular: RRR Lungs: diminished, snoring respirations Abdomen: Soft, nontender, nondistended Neurological: Betty Jackson little more responsive to voice this morning, opens eyes more readily, but still lethargic - wakes up and fidgets in bed, doesn't say much intelligible - not following commands Extremities: No clubbing or cyanosis. No edema.   Data Reviewed: I have personally reviewed following labs and imaging studies  CBC: Recent Labs  Lab 05/20/22 1316 05/20/22 1409 05/20/22 2310 05/20/22 2331 05/21/22 0151 05/21/22 0600 05/21/22 0609  WBC 12.9*  --  24.7*  --   --  28.5*  --   HGB 12.5   < > 12.9 12.9 11.6* 11.8* 12.2  HCT 41.3   < > 41.7 38.0 34.0* 39.1 36.0  MCV 103.8*  --  103.0*  --   --  104.0*  --   PLT 202  --  178  --   --  189  --    < > = values in this interval not displayed.    Basic Metabolic Panel: Recent Labs  Lab 05/20/22 1316 05/20/22 1409 05/20/22 1425 05/20/22 1819 05/20/22 2310 05/20/22 2331 05/21/22 0151 05/21/22 0600 05/21/22 0609  NA 147* 150*   < > 148*  --  148* 149* 149* 150*  K 3.7 3.6   < > 4.4  --  4.2 3.7 3.8 3.8  CL 107 108  --   --   --   --   --  112*  --   CO2 28  --   --   --   --   --   --  22  --   GLUCOSE 123* 121*  --   --   --   --   --  173*  --   BUN 31* 33*  --   --   --   --   --  31*  --   CREATININE 1.65* 1.70*  --   --  1.82*  --   --  2.30*  --   CALCIUM 9.3  --   --   --   --   --   --  8.6*  --    < > = values in this interval not displayed.    GFR: Estimated Creatinine Clearance: 18.1 mL/min (Betty Jackson) (by C-G formula based on SCr of 2.3 mg/dL (H)).  Liver Function Tests: Recent Labs  Lab 05/20/22 1316 05/21/22 0600  AST  24 41  ALT 14 19  ALKPHOS 81 105  BILITOT 1.4* 1.0  PROT 6.5 5.7*  ALBUMIN 3.4* 2.7*    CBG: Recent Labs  Lab  05/21/22 0353  GLUCAP 155*     Recent Results (from the past 240 hour(s))  Resp panel by RT-PCR (RSV, Flu Betty Jackson&B, Covid) Anterior Nasal Swab     Status: None   Collection Time: 05/20/22 12:41 PM   Specimen: Anterior Nasal Swab  Result Value Ref Range Status   SARS Coronavirus 2 by RT PCR NEGATIVE NEGATIVE Final   Influenza Makylee Sanborn by PCR NEGATIVE NEGATIVE Final   Influenza B by PCR NEGATIVE NEGATIVE Final    Comment: (NOTE) The Xpert Xpress SARS-CoV-2/FLU/RSV plus assay is intended as an aid in the diagnosis of influenza from Nasopharyngeal swab specimens and should not be used as Mittie Knittel sole basis for treatment. Nasal washings and aspirates are unacceptable for Xpert Xpress SARS-CoV-2/FLU/RSV testing.  Fact Sheet for Patients: EntrepreneurPulse.com.au  Fact Sheet for Healthcare Providers: IncredibleEmployment.be  This test is not yet approved or cleared by the [place] FDA and has been authorized for detection and/or diagnosis of SARS-CoV-2 by FDA under an Emergency Use Authorization (EUA). This EUA will remain in effect (meaning this test can be used) for the duration of the COVID-19 declaration under Section 564(b)(1) of the Act, 21 U.S.C. section 360bbb-3(b)(1), unless the authorization is terminated or revoked.     Resp Syncytial Virus by PCR NEGATIVE NEGATIVE Final    Comment: (NOTE) Fact Sheet for Patients: EntrepreneurPulse.com.au  Fact Sheet for Healthcare Providers: IncredibleEmployment.be  This test is not yet approved or cleared by the [place] FDA and has been authorized for detection and/or diagnosis of SARS-CoV-2 by FDA under an Emergency Use Authorization (EUA). This EUA will remain in effect (meaning this test can be used) for the duration of the COVID-19 declaration under Section 564(b)(1) of the Act, 21 U.S.C. section 360bbb-3(b)(1), unless the authorization is terminated  or revoked.  Performed at [facility], [place] [address], [place], [place] [address]   Culture, blood (routine x 2) Call MD if unable to obtain prior to antibiotics being given     Status: None (Preliminary result)   Collection Time: 05/20/22 10:38 PM   Specimen: BLOOD RIGHT HAND  Result Value Ref Range Status   Specimen Description BLOOD RIGHT HAND  Final   Special Requests   Final    BOTTLES DRAWN AEROBIC AND ANAEROBIC Blood Culture results may not be optimal due to an inadequate volume of blood received in culture bottles   Culture   Final    NO GROWTH < 12 HOURS Performed at [facility], [place] [address], [place], [place] [address]    Report Status PENDING  Incomplete  Culture, blood (routine x 2) Call MD if unable to obtain prior to antibiotics being given     Status: None (Preliminary result)   Collection Time: 05/20/22 10:38 PM   Specimen: BLOOD LEFT HAND  Result Value Ref Range Status   Specimen Description BLOOD LEFT HAND  Final   Special Requests   Final    BOTTLES DRAWN AEROBIC AND ANAEROBIC Blood Culture results may not be optimal due to an inadequate volume of blood received in culture bottles   Culture   Final    NO GROWTH < 12 HOURS Performed at [facility], [place] [address] [place], [place] [address]    Report Status PENDING  Incomplete  Radiology Studies: DG CHEST PORT 1 VIEW  Result Date: 05/21/2022 CLINICAL DATA:  88 year old female with chest pain, hypoxia, vomiting. EXAM: PORTABLE CHEST 1 VIEW COMPARISON:  CT Chest, Abdomen, and Pelvis yesterday, and earlier. FINDINGS: Portable AP semi upright view at 0400 hours. Continued low lung volumes. Stable mediastinal contours. Ongoing curvilinear opacity at the lung bases left greater than right, not significantly changed from the chest CT yesterday. No pneumothorax, pulmonary edema, or pleural effusion identified. Visualized tracheal air column is within normal limits. Paucity of bowel gas in  the visible abdomen. Stable cholecystectomy clips. Stable visualized osseous structures. IMPRESSION: 1. Continued low lung volumes and bibasilar lung opacity, stable from the chest CT yesterday. Appearance favors atelectasis, but cannot exclude lung base infection. 2. No new cardiopulmonary abnormality identified. Electronically Signed   By: Genevie Ann M.D.   On: 05/21/2022 04:07   CT CHEST ABDOMEN PELVIS WO CONTRAST  Result Date: 05/20/2022 CLINICAL DATA:  Abdominal pain, acute, nonlocalized.  Chest pain. EXAM: CT CHEST, ABDOMEN AND PELVIS WITHOUT CONTRAST TECHNIQUE: Multidetector CT imaging of the chest, abdomen and pelvis was performed following the standard protocol without IV contrast. RADIATION DOSE REDUCTION: This exam was performed according to the departmental dose-optimization program which includes automated exposure control, adjustment of the mA and/or kV according to patient size and/or use of iterative reconstruction technique. COMPARISON:  Chest CT 09/04/2021.  CT abdomen 07/17/2020. FINDINGS: CT CHEST FINDINGS Cardiovascular: Mild cardiomegaly. Coronary artery calcification and aortic atherosclerotic calcification. Mediastinum/Nodes: No mediastinal or hilar mass or lymphadenopathy. Lungs/Pleura: No pleural effusion. Patchy density at both lung bases left more than right could be scarring or bibasilar pneumonia. Lung apices show benign pleural and parenchymal scarring. Musculoskeletal: Old augmented compression fracture at T11 without further collapse. Chronic superior endplate fracture at T7 with loss of height of 30%. No progressive collapse. No new fracture. CT ABDOMEN PELVIS FINDINGS Hepatobiliary: No liver lesion seen on this study done without contrast. Previous cholecystectomy. Mild chronic ductal prominence. Pancreas: Motion degraded.  No abnormality seen. Spleen: Negative Adrenals/Urinary Tract: Adrenal glands are normal. No acute renal finding. No hydronephrosis. Bladder appears  unremarkable. Stomach/Bowel: Stomach and small intestine are unremarkable. Normal appendix. Diverticulosis of the colon without visible diverticulitis. Rectal fecal impaction. Vascular/Lymphatic: Aortic atherosclerosis. No aneurysm. IVC is normal. No adenopathy. Reproductive: Previous hysterectomy.  No pelvic mass. Other: No free fluid or air. Musculoskeletal: No acute lumbar spine or pelvic abnormality. Old vertebral augmentation at L1. Previous decompression and fusion L2 through L4. IMPRESSION: 1. Patchy density at both lung bases left more than right could be scarring or bibasilar pneumonia. 2. Cardiomegaly. Coronary artery calcification. Aortic atherosclerosis. 3. No acute or traumatic finding in the abdomen or pelvis. 4. Rectal fecal impaction. 5. Diverticulosis without visible diverticulitis. 6. Old augmented compression fracture at T11 without further collapse. Chronic superior endplate fracture at T7 with loss of height of 30%. No progressive collapse. No new fracture. Old vertebral augmentation at L1. Previous decompression and fusion L2 through L4. Aortic Atherosclerosis (ICD10-I70.0). Electronically Signed   By: Nelson Chimes M.D.   On: 05/20/2022 16:10   CT Head Wo Contrast  Result Date: 05/20/2022 CLINICAL DATA:  Altered mental status EXAM: CT HEAD WITHOUT CONTRAST TECHNIQUE: Contiguous axial images were obtained from the base of the skull through the vertex without intravenous contrast. RADIATION DOSE REDUCTION: This exam was performed according to the departmental dose-optimization program which includes automated exposure control, adjustment of the mA and/or kV according to patient size and/or use of iterative reconstruction  technique. COMPARISON:  04/30/21 CT head FINDINGS: Brain: No evidence of acute infarction, hemorrhage, hydrocephalus, extra-axial collection or mass lesion/mass effect. Sequela of severe chronic microvascular ischemic change, unchanged from prior exam. Vascular: No hyperdense  vessel. Redemonstrated calcified atherosclerotic plaque in the proximal left M1. Skull: Normal. Negative for fracture or focal lesion. Sinuses/Orbits: Bilateral lens replacement. Trace left mastoid effusion. No middle ear effusion. Paranasal sinuses are clear. Other: There is Jolinda Pinkstaff pannus at the craniocervical junction. IMPRESSION: No acute intracranial abnormality. Electronically Signed   By: Marin Roberts M.D.   On: 05/20/2022 16:04   DG Chest Port 1 View  Result Date: 05/20/2022 CLINICAL DATA:  Chest pain, emesis and shortness of breath. EXAM: PORTABLE CHEST 1 VIEW COMPARISON:  09/03/2021; 08/06/2021; chest CT-09/04/2021 FINDINGS: Grossly unchanged cardiac silhouette and mediastinal contours. Grossly unchanged bibasilar heterogeneous opacities, left-greater-than-right. No new focal airspace opacities. No pleural effusion or pneumothorax. No evidence of edema. No acute osseous abnormalities. Degenerative change of the bilateral glenohumeral joints is suspected though incompletely evaluated. Postcholecystectomy. IMPRESSION: Similar findings of bibasilar atelectasis without superimposed acute cardiopulmonary disease on this AP portable examination. Electronically Signed   By: Sandi Mariscal M.D.   On: 05/20/2022 13:14        Scheduled Meds:  fentaNYL  1 patch Transdermal Q72H   ipratropium-albuterol  3 mL Nebulization Q6H   methylPREDNISolone (SOLU-MEDROL) injection  40 mg Intravenous Q12H   pantoprazole (PROTONIX) IV  40 mg Intravenous Q12H   sodium phosphate  1 enema Rectal Once   [START ON 05/24/2022] thiamine (VITAMIN B1) injection  100 mg Intravenous Daily   Continuous Infusions:  azithromycin     cefTRIAXone (ROCEPHIN)  IV     dextrose 5 % and 0.45% NaCl     heparin 1,000 Units/hr (05/20/22 2033)   lactated ringers 75 mL/hr at 05/20/22 2130   thiamine (VITAMIN B1) injection       LOS: 1 day    Time spent: over 30 min    Fayrene Helper, MD [facility]   To contact the  attending provider between 7A-7P or the covering provider during after hours 7P-7A, please log into the web site [email or web address] and access using universal [place] password for that web site. If you do not have the password, please call the hospital operator.  05/21/2022, 8:56 AM

## 2022-05-21 NOTE — ED Notes (Addendum)
Pt suppository given, tolerated well. pt still at baseline alertness/GCS, new labs sent. Call bell with family.

## 2022-05-21 NOTE — ED Notes (Signed)
Pt is back comfortable and resting, rise/fall of chest noted. No acute distress noted. Family @ bedside, call bell in reach.

## 2022-05-21 NOTE — ED Notes (Addendum)
Verbal order by Dr. [place] Crane to hold amiodarone due to current HR being back down, told to start if HR > 125. Pt alert, nonverbal. Call bell in reach of family.

## 2022-05-21 NOTE — Progress Notes (Signed)
ANTICOAGULATION CONSULT NOTE  Pharmacy Consult for heparin Indication: atrial fibrillation  Allergies  Allergen Reactions   Alendronate Sodium     Suspected cause of severe esophagitis (per EGD 08/05/21)   Dextromethorphan Rash    Patient Measurements: Height: 5' 4"$  (162.6 cm) Weight: 78 kg (172 lb) IBW/kg (Calculated) : 54.7 Heparin Dosing Weight: 71kg  Vital Signs: Temp: 98.5 F (36.9 C) (02/22 1105) Temp Source: Axillary (02/22 1105) BP: 95/50 (02/22 1100) Pulse Rate: 81 (02/22 1100)  Labs: Recent Labs    05/20/22 1316 05/20/22 1409 05/20/22 1425 05/20/22 1458 05/20/22 1819 05/20/22 2310 05/20/22 2331 05/21/22 0151 05/21/22 0600 05/21/22 0609 05/21/22 0843  HGB 12.5 13.9   < >  --    < > 12.9   < > 11.6* 11.8* 12.2  --   HCT 41.3 41.0   < >  --    < > 41.7   < > 34.0* 39.1 36.0  --   PLT 202  --   --   --   --  178  --   --  189  --   --   APTT  --   --   --   --   --   --   --   --   --   --  >200*  HEPARINUNFRC  --   --   --   --   --   --   --   --   --   --  >1.10*  CREATININE 1.65* 1.70*  --   --   --  1.82*  --   --  2.30*  --   --   TROPONINIHS 13  --   --  13  --   --   --   --   --   --   --    < > = values in this interval not displayed.     Estimated Creatinine Clearance: 18.1 mL/min (A) (by C-G formula based on SCr of 2.3 mg/dL (H)).   Medical History: Past Medical History:  Diagnosis Date   Acute respiratory failure with hypoxia ([place]) 05/20/2017   Anemia, unspecified 10/28/2012   Anxiety state, unspecified 10/28/2012   CAD (coronary artery disease)    [facility];  b. Cath approx 2009 - nonobs per pt report.   Cataract    immature on the left eye   Chronic insomnia 02/07/2013   Chronic lower back pain    scoliosis   CKD (chronic kidney disease) stage 3, GFR 30-59 ml/min (HCC)    CVA (cerebral infarction) 10/29/2012   DDD (degenerative disc disease)    Diverticulosis    GERD (gastroesophageal reflux disease)  09/01/2010   Hemorrhoids    Herniated nucleus pulposus, L5-S1, right 11/04/2015   Hyperlipidemia    takes Lipitor daily   Hypertension    takes Amlodipine,Losartan,Metoprolol,and HCTZ daily   Incontinence of urine    Insomnia    takes Restoril nightly   Lumbar stenosis 04/25/2013   Major depressive disorder, recurrent episode, moderate ([place]) 07/16/2013   Osteoporosis    PAF (paroxysmal atrial fibrillation) ([place]) 2011   a. lone epidode in 2011 according to notes.   Rheumatoid arthritis ([place])    Scoliosis    Sepsis ([place]) 05/2019   Stroke [facility])    Temporal arteritis ([place]) 2011   a. followed @ [place]; potential flareup 10/28/2012/notes 10/28/2012   Vocal cord dysfunction    "they don't operate properly" (10/28/2012)  Medications:  (Not in a hospital admission) Scheduled:   fentaNYL  1 patch Transdermal Q72H   ipratropium-albuterol  3 mL Nebulization Q6H   methylPREDNISolone (SOLU-MEDROL) injection  40 mg Intravenous Q12H   pantoprazole (PROTONIX) IV  40 mg Intravenous Q12H   sodium phosphate  1 enema Rectal Once   [START ON 05/24/2022] thiamine (VITAMIN B1) injection  100 mg Intravenous Daily   Infusions:   azithromycin     cefTRIAXone (ROCEPHIN)  IV     dextrose 5 % and 0.45% NaCl 125 mL/hr at 05/21/22 0925   heparin 1,000 Units/hr (05/20/22 2033)   thiamine (VITAMIN B1) injection Stopped (05/21/22 1109)    Assessment: Pt with a hx of AF on apixaban presenting with PNA. Heparin ordered for bridging. Her last apixaban dose was at 2100 on 2/20. We will use PTT for now until correlation.   Initial aPTT >200; however, per discussion with RN, this may be lab error and will redraw stat to confirm. Heparin level elevated, as expected, due to influence of apixaban. CBC WNL. No bleed issues reported.  Goal of Therapy:  Heparin level 0.3-0.7 units/ml aPTT 66-102 seconds Monitor platelets by anticoagulation protocol: Yes   Plan:  Continue heparin infusion at 1000 units/hr for  now F/u stat aPTT redraw for adjustments Monitor daily aPTT/heparin level until correlating, CBC, s/sx bleeding   Arturo Morton, PharmD, BCPS Please check AMION for all [facility] contact numbers Clinical Pharmacist 05/21/2022 11:43 AM

## 2022-05-21 NOTE — Significant Event (Signed)
Rapid Response Event Note   Reason for Call :  Decreased responsiveness  Initial Focused Assessment:  Patient laying in bed with eyes closed. Patient aroused to repeated noxious stimulation and would mumble words incomprehensibly. Daughter said she was talking with her 10 minutes prior to my arrival and was otherwise acting normal.   Skin is warm to the touch, slightly diaphoretic, breathing is regular and unlabored. Patient last had pain medication at 14- daughter is declining narcan administration d/t her previous response to it.   Pupils are 2 round, reactive and brisk to light, moves all extremities.   VS: T 99.3 Axillary (99 rectal), HR 82, BP 105/66, RR 14, Sats 98 on 2LNC   Interventions:  ABG Patient had dark stool- FOBT sent CBC  Plan of Care:  Continue to monitor mental status closely ABG satisfactory- If patient respiratory status decompensates, may be benefit from BiPAP Reach out to MD for further concerns Reach out to Rapid Response if further assistance is needed  Event Summary:  Patient is more responsive after being cleaned up from stool, able to maintain eye contact, and answer questions appropriately.   MD Notified: Dr. [place] Crane Call Time: 2032 Arrival Time: 2040 End Time: 2141  Monna Fam, RN

## 2022-05-21 NOTE — Progress Notes (Signed)
Patient weaned off BiPAP per MD.  Patient weaned to 6l [place].  RT will continue to monitor.

## 2022-05-22 ENCOUNTER — Inpatient Hospital Stay (HOSPITAL_COMMUNITY): Payer: 59

## 2022-05-22 DIAGNOSIS — D500 Iron deficiency anemia secondary to blood loss (chronic): Secondary | ICD-10-CM | POA: Diagnosis not present

## 2022-05-22 DIAGNOSIS — N179 Acute kidney failure, unspecified: Secondary | ICD-10-CM

## 2022-05-22 DIAGNOSIS — Z7189 Other specified counseling: Secondary | ICD-10-CM

## 2022-05-22 DIAGNOSIS — R112 Nausea with vomiting, unspecified: Secondary | ICD-10-CM

## 2022-05-22 DIAGNOSIS — Z789 Other specified health status: Secondary | ICD-10-CM

## 2022-05-22 DIAGNOSIS — R0902 Hypoxemia: Secondary | ICD-10-CM

## 2022-05-22 DIAGNOSIS — Z515 Encounter for palliative care: Secondary | ICD-10-CM

## 2022-05-22 DIAGNOSIS — R4182 Altered mental status, unspecified: Secondary | ICD-10-CM | POA: Diagnosis not present

## 2022-05-22 DIAGNOSIS — J189 Pneumonia, unspecified organism: Secondary | ICD-10-CM | POA: Diagnosis not present

## 2022-05-22 DIAGNOSIS — Z66 Do not resuscitate: Secondary | ICD-10-CM

## 2022-05-22 LAB — HEMOGLOBIN
Hemoglobin: 10.4 g/dL — ABNORMAL LOW (ref 12.0–15.0)
Hemoglobin: 10.4 g/dL — ABNORMAL LOW (ref 12.0–15.0)
Hemoglobin: 9.6 g/dL — ABNORMAL LOW (ref 12.0–15.0)

## 2022-05-22 LAB — COMPREHENSIVE METABOLIC PANEL
ALT: 16 U/L (ref 0–44)
AST: 29 U/L (ref 15–41)
Albumin: 2.4 g/dL — ABNORMAL LOW (ref 3.5–5.0)
Alkaline Phosphatase: 74 U/L (ref 38–126)
Anion gap: 6 (ref 5–15)
BUN: 42 mg/dL — ABNORMAL HIGH (ref 8–23)
CO2: 25 mmol/L (ref 22–32)
Calcium: 7.4 mg/dL — ABNORMAL LOW (ref 8.9–10.3)
Chloride: 111 mmol/L (ref 98–111)
Creatinine, Ser: 2.43 mg/dL — ABNORMAL HIGH (ref 0.44–1.00)
GFR, Estimated: 19 mL/min — ABNORMAL LOW (ref >60)
Glucose, Bld: 174 mg/dL — ABNORMAL HIGH (ref 70–99)
Potassium: 4.1 mmol/L (ref 3.5–5.1)
Sodium: 142 mmol/L (ref 135–145)
Total Bilirubin: 0.5 mg/dL (ref 0.3–1.2)
Total Protein: 5.1 g/dL — ABNORMAL LOW (ref 6.5–8.1)

## 2022-05-22 LAB — CBC WITH DIFFERENTIAL/PLATELET
Abs Immature Granulocytes: 0 10*3/uL (ref 0.00–0.07)
Basophils Absolute: 0 10*3/uL (ref 0.0–0.1)
Basophils Relative: 0 %
Eosinophils Absolute: 0 10*3/uL (ref 0.0–0.5)
Eosinophils Relative: 0 %
HCT: 31.9 % — ABNORMAL LOW (ref 36.0–46.0)
Hemoglobin: 9.8 g/dL — ABNORMAL LOW (ref 12.0–15.0)
Lymphocytes Relative: 4 %
Lymphs Abs: 1.1 10*3/uL (ref 0.7–4.0)
MCH: 31.5 pg (ref 26.0–34.0)
MCHC: 30.7 g/dL (ref 30.0–36.0)
MCV: 102.6 fL — ABNORMAL HIGH (ref 80.0–100.0)
Monocytes Absolute: 0.5 10*3/uL (ref 0.1–1.0)
Monocytes Relative: 2 %
Neutro Abs: 25.2 10*3/uL — ABNORMAL HIGH (ref 1.7–7.7)
Neutrophils Relative %: 94 %
Platelets: 185 10*3/uL (ref 150–400)
RBC: 3.11 MIL/uL — ABNORMAL LOW (ref 3.87–5.11)
RDW: 14.5 % (ref 11.5–15.5)
WBC: 26.8 10*3/uL — ABNORMAL HIGH (ref 4.0–10.5)
nRBC: 0 /100 WBC
nRBC: 0.1 % (ref 0.0–0.2)

## 2022-05-22 LAB — TYPE AND SCREEN
ABO/RH(D): O POS
Antibody Screen: NEGATIVE

## 2022-05-22 LAB — APTT
aPTT: 84 seconds — ABNORMAL HIGH (ref 24–36)
aPTT: 82 seconds — ABNORMAL HIGH (ref 24–36)

## 2022-05-22 LAB — HEPARIN LEVEL (UNFRACTIONATED): Heparin Unfractionated: >1.1 IU/mL — ABNORMAL HIGH (ref 0.30–0.70)

## 2022-05-22 LAB — PHOSPHORUS: Phosphorus: 3.3 mg/dL (ref 2.5–4.6)

## 2022-05-22 LAB — HEMOGLOBIN AND HEMATOCRIT, BLOOD
HCT: 34.8 % — ABNORMAL LOW (ref 36.0–46.0)
Hemoglobin: 11.1 g/dL — ABNORMAL LOW (ref 12.0–15.0)

## 2022-05-22 LAB — MAGNESIUM: Magnesium: 1.5 mg/dL — ABNORMAL LOW (ref 1.7–2.4)

## 2022-05-22 MED ORDER — PREDNISONE 20 MG PO TABS
40.0000 mg | ORAL_TABLET | Freq: Every day | ORAL | Status: AC
  Administered 2022-05-23: 40 mg via ORAL

## 2022-05-22 MED ORDER — ALLOPURINOL 100 MG PO TABS
100.0000 mg | ORAL_TABLET | Freq: Every day | ORAL | Status: DC
  Administered 2022-05-22 – 2022-05-28 (×7): 100 mg via ORAL
  Filled 2022-05-22 (×7): qty 1

## 2022-05-22 MED ORDER — PREDNISONE 5 MG PO TABS
30.0000 mg | ORAL_TABLET | Freq: Every day | ORAL | Status: AC
  Administered 2022-05-24: 30 mg via ORAL
  Filled 2022-05-22: qty 2

## 2022-05-22 MED ORDER — QUETIAPINE FUMARATE 25 MG PO TABS
12.5000 mg | ORAL_TABLET | ORAL | Status: AC
  Administered 2022-05-22: 12.5 mg via ORAL
  Filled 2022-05-22: qty 1

## 2022-05-22 MED ORDER — LORATADINE 10 MG PO TABS
10.0000 mg | ORAL_TABLET | Freq: Every day | ORAL | Status: DC
  Administered 2022-05-22 – 2022-05-28 (×7): 10 mg via ORAL
  Filled 2022-05-22 (×7): qty 1

## 2022-05-22 MED ORDER — PANTOPRAZOLE INFUSION (NEW) - SIMPLE MED
8.0000 mg/h | INTRAVENOUS | Status: AC
  Administered 2022-05-22 – 2022-05-24 (×5): 8 mg/h via INTRAVENOUS
  Filled 2022-05-22 (×9): qty 100

## 2022-05-22 MED ORDER — DEXTROSE-NACL 5-0.45 % IV SOLN: INTRAVENOUS | Status: AC

## 2022-05-22 MED ORDER — ATORVASTATIN CALCIUM 80 MG PO TABS
80.0000 mg | ORAL_TABLET | Freq: Every day | ORAL | Status: DC
  Administered 2022-05-22 – 2022-05-27 (×6): 80 mg via ORAL
  Filled 2022-05-22 (×6): qty 1

## 2022-05-22 MED ORDER — PANTOPRAZOLE SODIUM 40 MG IV SOLR
40.0000 mg | Freq: Two times a day (BID) | INTRAVENOUS | Status: DC
  Administered 2022-05-22: 40 mg via INTRAVENOUS

## 2022-05-22 MED ORDER — PREDNISONE 20 MG PO TABS
20.0000 mg | ORAL_TABLET | Freq: Every day | ORAL | Status: AC
  Administered 2022-05-25: 20 mg via ORAL
  Filled 2022-05-22: qty 1

## 2022-05-22 MED ORDER — PANTOPRAZOLE SODIUM 40 MG IV SOLR
40.0000 mg | Freq: Once | INTRAVENOUS | Status: AC
  Administered 2022-05-22: 40 mg via INTRAVENOUS
  Filled 2022-05-22: qty 10

## 2022-05-22 MED ORDER — METOPROLOL TARTRATE 12.5 MG HALF TABLET
12.5000 mg | ORAL_TABLET | Freq: Two times a day (BID) | ORAL | Status: DC
  Administered 2022-05-22 – 2022-05-26 (×9): 12.5 mg via ORAL
  Filled 2022-05-22 (×9): qty 1

## 2022-05-22 MED ORDER — AMIODARONE HCL 200 MG PO TABS
200.0000 mg | ORAL_TABLET | Freq: Every day | ORAL | Status: DC
  Administered 2022-05-22 – 2022-05-28 (×7): 200 mg via ORAL
  Filled 2022-05-22 (×7): qty 1

## 2022-05-22 MED ORDER — ESCITALOPRAM OXALATE 10 MG PO TABS
10.0000 mg | ORAL_TABLET | Freq: Every day | ORAL | Status: DC
  Administered 2022-05-22 – 2022-05-28 (×6): 10 mg via ORAL
  Filled 2022-05-22 (×6): qty 1

## 2022-05-22 MED ORDER — PREDNISONE 5 MG PO TABS
10.0000 mg | ORAL_TABLET | Freq: Every day | ORAL | Status: DC
  Administered 2022-05-26 – 2022-05-28 (×3): 10 mg via ORAL
  Filled 2022-05-22 (×4): qty 2

## 2022-05-22 NOTE — Progress Notes (Signed)
EEG complete - results pending 

## 2022-05-22 NOTE — Progress Notes (Signed)
Received a call from bedside RN regarding the patient being hypersomnolent.  Presented at bedside.  The patient's depends is being changed, noted dark tarry stools.  Rapid response team and bedside RN are in the room, as well as the patient's daughter Betty Jackson who is also medical power of attorney.  Exam notable for pinpoint pupils, lethargy, soft BPs with concern for opiate overdose.  No Narcan is to be administered per the patient's medical power of attorney, did not like the patient's response to Narcan yesterday.  CBC and FOBT ordered and resulted.  The patient has acute blood loss anemia with hemoglobin drop from 12.2K to 9.9K and positive FOBT.  Repeat a.m. CBC is pending.    History of esophagitis and on heparin drip for paroxysmal A-fib.  Started on IV Protonix 40 mg twice daily last night, continue.    Heparin drip paused and GI [place], Dr. Hilarie Fredrickson, consulted due to concern for acute GI bleed.  Will keep n.p.o. for now except sips and meds until seen by GI.  Time: 15 minutes.

## 2022-05-22 NOTE — Consult Note (Signed)
Consultation  Referring Provider:  Dr. Florene Glen    Primary Care Physician:  Alvester Morin, MD Primary Gastroenterologist:    Dr. Loletha Carrow     Reason for Consultation: Anemia            HPI:   Betty Jackson is a 88 y.o. female with a past medical history significant for chronic pain on chronic opiates, A-fib on Eliquis, RA and temporal arteritis on chronic steroids and others as listed below who initially presented to the ER on 05/20/2022 with "confusion".  We are consulted now in regards to anemia.    At time of arrival patient came from a skilled level of care in a facility, wheelchair-bound.  Daughter had noticed 4 to 5 days of more confusion and lethargy.  She was noted to have hypoxia and altered mental status with her last normal on 2/20.  Patient admitted for pneumonia and hypercarbic respiratory failure.    Per notes from this morning around 5:47 AM patient was wearing depends and noted to have tarry dark stools.  At that time labs ordered showing hemoglobin dropped from 12.2-9.9-9.8 and a positive FOBT.  Patient noted to have a large dark bowel movement with some blood noted burgundy in color at 806 this morning.  Heparin drip is currently on hold.    Today, patient is seen with family by the bedside.  They describe that patient had some bleeding overnight and then has had another bloody bowel movement since.  Patient denies any new abdominal pain or GI concerns.  Family reminds me of her history of esophagitis in the past.  She has been on her medications per their knowledge.  They would like to proceed with EGD for further evaluation.    Denies abdominal pain, nausea, vomiting or blood thinners currently.  GI history: 09/10/2021 colonoscopy done for hematochezia with hemorrhoids, external and internal as well as a single solitary ulcer in the distal rectum and two 6-8 mm polyps in the descending and transverse colon as well as diverticulosis in the sigmoid and descending  colon 09/10/2021 EGD normal esophagus (prior severe esophagitis resolved), erythematous mucosa in the antrum, otherwise normal 08/05/2021 EGD severe esophagitis with bleeding and gastritis  Past Medical History:  Diagnosis Date   Acute respiratory failure with hypoxia ([place]) 05/20/2017   Anemia, unspecified 10/28/2012   Anxiety state, unspecified 10/28/2012   CAD (coronary artery disease)    a. Stent RCA in [facility];  b. Cath approx 2009 - nonobs per pt report.   Cataract    immature on the left eye   Chronic insomnia 02/07/2013   Chronic lower back pain    scoliosis   CKD (chronic kidney disease) stage 3, GFR 30-59 ml/min (HCC)    CVA (cerebral infarction) 10/29/2012   DDD (degenerative disc disease)    Diverticulosis    GERD (gastroesophageal reflux disease) 09/01/2010   Hemorrhoids    Herniated nucleus pulposus, L5-S1, right 11/04/2015   Hyperlipidemia    takes Lipitor daily   Hypertension    takes Amlodipine,Losartan,Metoprolol,and HCTZ daily   Incontinence of urine    Insomnia    takes Restoril nightly   Lumbar stenosis 04/25/2013   Major depressive disorder, recurrent episode, moderate ([place]) 07/16/2013   Osteoporosis    PAF (paroxysmal atrial fibrillation) ([place]) 2011   a. lone epidode in 2011 according to notes.   Rheumatoid arthritis ([place])    Scoliosis    Sepsis ([place]) 05/2019   Stroke ([place])  Temporal arteritis ([place]) 2011   a. followed @ [facility]; potential flareup 10/28/2012/notes 10/28/2012   Vocal cord dysfunction    "they don't operate properly" (10/28/2012)    Past Surgical History:  Procedure Laterality Date   ABDOMINAL HYSTERECTOMY  ~ 1984   vaginally   BACK SURGERY  7-43yr ago   X Stop   BLADDER SUSPENSION  2001   BREAST BIOPSY Right    CATARACT EXTRACTION W/ INTRAOCULAR LENS IMPLANT Right ~ 08/2012   CHEST TUBE INSERTION Left 03/08/2019   Procedure: Chest Tube Insertion;  Surgeon: VIvin Poot MD;  [facility]  Service: Thoracic;  Laterality:  Left;   COLONOSCOPY  01/26/2012   Procedure: COLONOSCOPY;  Surgeon: MLadene Artist MD,FACG;  [facility]  Service: Endoscopy;  Laterality: N/A;  note the EGD is possible   COLONOSCOPY WITH PROPOFOL N/A 09/10/2021   Procedure: COLONOSCOPY WITH PROPOFOL;  Surgeon: GGatha Mayer MD;  [place] [facility]  Service: Gastroenterology;  Laterality: N/A;   CORONARY ANGIOPLASTY WITH STENT PLACEMENT  2006   X 1 stent   EPIDURAL BLOCK INJECTION     ESOPHAGOGASTRODUODENOSCOPY  01/26/2012   Procedure: ESOPHAGOGASTRODUODENOSCOPY (EGD);  Surgeon: MLadene Artist MD,FACG;  [facility];  Service: Endoscopy;  Laterality: N/A;   ESOPHAGOGASTRODUODENOSCOPY (EGD) WITH PROPOFOL N/A 08/05/2021   Procedure: ESOPHAGOGASTRODUODENOSCOPY (EGD) WITH PROPOFOL;  Surgeon: DDoran Stabler MD;  [place]  Service: Gastroenterology;  Laterality: N/A;   ESOPHAGOGASTRODUODENOSCOPY (EGD) WITH PROPOFOL N/A 09/10/2021   Procedure: ESOPHAGOGASTRODUODENOSCOPY (EGD) WITH PROPOFOL;  Surgeon: GGatha Mayer MD;  [place] [facility]  Service: Gastroenterology;  Laterality: N/A;   FINE NEEDLE ASPIRATION Right 09/26/2019   Procedure: FINE NEEDLE ASPIRATION;  Surgeon: HAltamese [place] MD;  [place] [facility]  Service: Orthopedics;  Laterality: Right;   FOREIGN BODY REMOVAL  08/05/2021   Procedure: FOREIGN BODY REMOVAL;  Surgeon: DDoran Stabler MD;  [facility];  Service: Gastroenterology;;   HEMIARTHROPLASTY HIP Right 2012   IR EPIDUROGRAPHY  04/20/2019   LAPAROSCOPIC CHOLECYSTECTOMY  2001   LUMBAR FUSION  03/2013   LUMBAR LAMINECTOMY/DECOMPRESSION MICRODISCECTOMY Right 11/04/2015   Procedure: Right Lumbar Five-Sacral One Microdiskectomy;  Surgeon: HKristeen Miss MD;  [facility];  Service: Neurosurgery;  Laterality: Right;  Right L5-S1 Microdiskectomy   moles removed that required stiches     one on leg and one on face   ORIF FEMUR FRACTURE Right 09/26/2019   Procedure:  OPEN REDUCTION INTERNAL FIXATION (ORIF) DISTAL FEMUR FRACTURE;  Surgeon: HAltamese Red Cross MD;  [place] [facility]  Service: Orthopedics;  Laterality: Right;   ORIF FEMUR FRACTURE Right 10/17/2019   Procedure: OPEN REDUCTION INTERNAL FIXATION (ORIF) DISTAL FEMUR FRACTURE;  Surgeon: HAltamese Emory MD;  [place] [facility]  Service: Orthopedics;  Laterality: Right;   POLYPECTOMY  09/10/2021   Procedure: POLYPECTOMY;  Surgeon: GGatha Mayer MD;  [place] [facility]  Service: Gastroenterology;;   SUBXYPHOID PERICARDIAL WINDOW N/A 03/08/2019   Procedure: SUBXYPHOID PERICARDIAL WINDOW;  Surgeon: VIvin Poot MD;  [place]  Service: Thoracic;  Laterality: N/A;   TEE WITHOUT CARDIOVERSION N/A 03/08/2019   Procedure: TRANSESOPHAGEAL ECHOCARDIOGRAM (TEE);  Surgeon: VPrescott Gum PCollier Salina MD;  [place] [facility]  Service: Thoracic;  Laterality: N/A;   TEMPORAL ARTERY BIOPSY / LIGATION Bilateral 2011   TONSILLECTOMY AND ADENOIDECTOMY     at age 88  [place] ~ 2010   "lower back" (10/28/2012)  Family History  Problem Relation Age of Onset   Brain cancer Mother 78   Heart attack Father    Breast cancer Daughter 38   Heart disease Other    Hypertension Other    Arthritis Neg Hx    Colon cancer Neg Hx    Osteoporosis Neg Hx     Social History   Tobacco Use   Smoking status: Never   Smokeless tobacco: Never  Vaping Use   Vaping Use: Never used  Substance Use Topics   Alcohol use: Yes    Comment: socially   Drug use: No    Prior to Admission medications   Medication Sig Start Date End Date Taking? Authorizing Provider  acetaminophen (TYLENOL) 500 MG tablet Take 500 mg by mouth every 6 (six) hours as needed for mild pain.   Yes [provider]  allopurinol (ZYLOPRIM) 100 MG tablet Take 100 mg by mouth daily.   Yes [provider]  amiodarone (PACERONE) 200 MG tablet Take '200mg'$  twice daily for 5 days, then take '200mg'$  once  daily. Patient taking differently: Take 200 mg by mouth daily. 08/11/21  Yes Bonnielee Haff, MD  atorvastatin (LIPITOR) 80 MG tablet Take 1 tablet (80 mg total) by mouth daily. Patient taking differently: Take 80 mg by mouth at bedtime. 09/25/20  Yes Garvin Fila, MD  cetirizine (ZYRTEC) 10 MG tablet Take 10 mg by mouth at bedtime.   Yes [provider]  cholecalciferol (VITAMIN D) 25 MCG tablet Take 2 tablets (2,000 Units total) by mouth 2 (two) times daily. 09/29/19  Yes Ainsley Spinner, PA-C  diltiazem (CARDIZEM CD) 240 MG 24 hr capsule Take 240 mg by mouth every evening. 10/10/21  Yes [provider]  ELIQUIS 5 MG TABS tablet TAKE 1 TABLET BY MOUTH TWICE A DAY Patient taking differently: Take 5 mg by mouth 2 (two) times daily. 05/09/20  Yes Burnell Blanks, MD  escitalopram (LEXAPRO) 10 MG tablet TAKE 1 TABLET BY MOUTH EVERY DAY Patient taking differently: Take 10 mg by mouth daily. 08/27/20  Yes Burchette, Alinda Sierras, MD  fentaNYL 37.5 MCG/HR PT72 Place 1 patch onto the skin every 3 (three) days. 08/10/21  Yes Bonnielee Haff, MD  ferrous sulfate 325 (65 FE) MG tablet Take 1 tablet (325 mg total) by mouth 2 (two) times daily with a meal. 04/21/19  Yes Darrick Meigs, Marge Duncans, MD  furosemide (LASIX) 40 MG tablet Take 40 mg by mouth daily.   Yes [provider]  furosemide (LASIX) 40 MG tablet Take 1 tablet (40 mg total) by mouth daily as needed (extra dose, for weight gain of 2 pounds overnight or 5 pound in 1 week). 09/11/21  Yes Barton Dubois, MD  HYDROcodone-acetaminophen (NORCO) 5-325 MG tablet Take 1 tablet by mouth every 12 (twelve) hours as needed for severe pain. 09/11/21  Yes Barton Dubois, MD  ipratropium-albuterol (DUONEB) 0.5-2.5 (3) MG/3ML SOLN Take 3 mLs by nebulization every 4 (four) hours as needed. Patient taking differently: Take 3 mLs by nebulization every 4 (four) hours as needed (wheezing/shortness of breath). 08/10/21  Yes Bonnielee Haff, MD  meloxicam (MOBIC)  7.5 MG tablet Take 7.5 mg by mouth daily.   Yes [provider]  metoprolol tartrate (LOPRESSOR) 100 MG tablet Take 100 mg by mouth 2 (two) times daily.   Yes [provider]  Multiple Vitamin (MULTIVITAMIN WITH MINERALS) TABS tablet Take 1 tablet by mouth daily. 10/21/19  Yes Ainsley Spinner, PA-C  nitroGLYCERIN (NITROSTAT) 0.4 MG SL  tablet Place 1 tablet (0.4 mg total) under the tongue every 5 (five) minutes as needed for chest pain. 02/25/22  Yes Burnell Blanks, MD  Nutritional Supplement LIQD Take 120 mLs by mouth daily. House stock supplement   Yes [provider]  ondansetron (ZOFRAN) 4 MG tablet Take 4 mg by mouth every 8 (eight) hours as needed for nausea or vomiting.   Yes [provider]  OXYGEN Inhale 2 L/min into the lungs as needed (maintain SATS above 90%).   Yes [provider]  pantoprazole (PROTONIX) 40 MG tablet Take 1 tablet (40 mg total) by mouth 2 (two) times daily. 08/11/21  Yes Bonnielee Haff, MD  polyethylene glycol (MIRALAX) 17 g packet Take 17 g by mouth daily. Patient taking differently: Take 17 g by mouth daily as needed for mild constipation. 09/11/21  Yes Barton Dubois, MD  potassium chloride (KLOR-CON M) 10 MEQ tablet Take 3 tablets (30 mEq total) by mouth daily. 09/11/21  Yes Barton Dubois, MD  Pramox-PE-Glycerin-Petrolatum (HEMORRHOIDAL MAX STR) 1-0.25-14.4-15 % CREA Apply 1 Application topically 2 (two) times daily as needed (hemorrhoidal pain/discomfort.). 09/11/21  Yes Barton Dubois, MD  predniSONE (DELTASONE) 10 MG tablet Take 1 tablet (10 mg total) by mouth daily with breakfast. Resume after 3 days of '20mg'$  daily as instructed. 08/15/21  Yes Bonnielee Haff, MD  QUEtiapine (SEROQUEL) 25 MG tablet Take 1 tablet (25 mg total) by mouth at bedtime. 03/16/21 05/20/22 Yes Pahwani, Einar Grad, MD  sulfaSALAzine (AZULFIDINE) 500 MG EC tablet Take 500 mg by mouth daily.   Yes [provider]    Current  Facility-Administered Medications  Medication Dose Route Frequency Provider Last Rate Last Admin   acetaminophen (TYLENOL) tablet 650 mg  650 mg Oral Q6H PRN Elodia Florence., MD       Or   acetaminophen (TYLENOL) suppository 650 mg  650 mg Rectal Q6H PRN Elodia Florence., MD   650 mg at 05/20/22 2240   albuterol (PROVENTIL) (2.5 MG/3ML) 0.083% nebulizer solution 2.5 mg  2.5 mg Nebulization Q4H PRN Elodia Florence., MD       azithromycin [facility]) 500 mg in sodium chloride 0.9 % 250 mL IVPB  500 mg Intravenous Q24H Elodia Florence., MD 250 mL/hr at 05/21/22 2220 500 mg at 05/21/22 2220   cefTRIAXone (ROCEPHIN) 2 g in sodium chloride 0.9 % 100 mL IVPB  2 g Intravenous Q24H Elodia Florence., MD 200 mL/hr at 05/21/22 1656 2 g at 05/21/22 1656   Chlorhexidine Gluconate Cloth 2 % PADS 6 each  6 each Topical Daily Elodia Florence., MD   6 each at 05/21/22 2213   dextrose 5 %-0.45 % sodium chloride infusion   Intravenous Continuous Elodia Florence., MD 125 mL/hr at 05/22/22 0328 New Bag at 05/22/22 0328   fentaNYL (DURAGESIC) 12 MCG/HR 1 patch  1 patch Transdermal Q72H Irene Pap N, DO   1 patch at 05/21/22 0520   fentaNYL (SUBLIMAZE) injection 12.5 mcg  12.5 mcg Intravenous Q2H PRN Kayleen Memos, DO   12.5 mcg at 05/21/22 1655   hydrocortisone (ANUSOL-HC) 2.5 % rectal cream 1 Application  1 Application Rectal BID PRN Elodia Florence., MD       methylPREDNISolone sodium succinate (SOLU-MEDROL) 40 mg/mL injection 40 mg  40 mg Intravenous Q12H Elodia Florence., MD   40 mg at 05/21/22 2217   naloxone [facility]) 0.4 MG/ML injection            [  START ON 05/25/2022] pantoprazole (PROTONIX) injection 40 mg  40 mg Intravenous Q12H Elodia Florence., MD       pantoprazole (PROTONIX) injection 40 mg  40 mg Intravenous Once Elodia Florence., MD       pantoprozole (PROTONIX) 80 mg /NS 100 mL infusion  8 mg/hr Intravenous Continuous Elodia Florence., MD       sodium phosphate (FLEET) 7-19 GM/118ML enema 1 enema  1 enema Rectal Once Elodia Florence., MD       thiamine (VITAMIN B1) 500 mg in sodium chloride 0.9 % 50 mL IVPB  500 mg Intravenous Daily Elodia Florence., MD   Stopped at 05/21/22 1109   Followed by   Derrill Memo ON 05/24/2022] thiamine (VITAMIN B1) injection 100 mg  100 mg Intravenous Daily Elodia Florence., MD        Allergies as of 05/20/2022 - Review Complete 05/20/2022  Allergen Reaction Noted   Alendronate sodium  09/05/2021   Dextromethorphan Rash 06/24/2013     Review of Systems:    Constitutional: No weight loss, fever or chills Skin: No rash  Cardiovascular: No chest pain Respiratory: +SOB Gastrointestinal: See HPI and otherwise negative Genitourinary: No dysuria  Neurological: No headache, dizziness or syncope Musculoskeletal: No new muscle or joint pain Hematologic: No bruising Psychiatric: No history of depression or anxiety    Physical Exam:  Vital signs in last 24 hours: Temp:  [98.1 F (36.7 C)-99.3 F (37.4 C)] 98.2 F (36.8 C) (02/23 0253) Pulse Rate:  [73-86] 73 (02/23 0253) Resp:  [13-16] 14 (02/23 0253) BP: (95-146)/(48-66) 110/48 (02/23 0253) SpO2:  [87 %-98 %] 97 % (02/23 0253) Weight:  [82.3 kg] 82.3 kg (02/23 0426) Last BM Date : 05/21/22 General:   Pleasant pale appearing Elderly Caucasian female appears to be in NAD, Well developed, Well nourished, alert and cooperative Head:  Normocephalic and atraumatic. Eyes:   PEERL, EOMI. No icterus. Conjunctiva pink. Ears:  Normal auditory acuity. Neck:  Supple Throat: Oral cavity and pharynx without inflammation, swelling or lesion.  Lungs: Respirations even and unlabored. Lungs clear to auscultation bilaterally.   No wheezes, crackles, or rhonchi. +on O2 via [place] Heart: Normal S1, S2. No MRG. Regular rate and rhythm. No peripheral edema, cyanosis or pallor.  Abdomen:  Soft, nondistended, nontender. No rebound or guarding.  Normal bowel sounds. No appreciable masses or hepatomegaly. Rectal:  Not performed.  Msk:  Symmetrical without gross deformities. Peripheral pulses intact.  Extremities:  Without edema, no deformity or joint abnormality.  Neurologic:  Alert and  oriented x4;  grossly normal neurologically.  Skin:   Dry and intact without significant lesions or rashes. Psychiatric: Demonstrates good judgement and reason without abnormal affect or behaviors.   LAB RESULTS: Recent Labs    05/21/22 0600 05/21/22 0609 05/21/22 2116 05/22/22 0625  WBC 28.5*  --  25.1* 26.8*  HGB 11.8* 12.2 9.9* 9.8*  HCT 39.1 36.0 32.9* 31.9*  PLT 189  --  183 185   BMET Recent Labs    05/21/22 0600 05/21/22 0609 05/21/22 1519 05/22/22 0625  NA 149* 150* 146* 142  K 3.8 3.8 4.6 4.1  CL 112*  --  111 111  CO2 22  --  25 25  GLUCOSE 173*  --  281* 174*  BUN 31*  --  38* 42*  CREATININE 2.30*  --  2.58* 2.43*  CALCIUM 8.6*  --  7.9* 7.4*   LFT Recent Labs  05/22/22 0625  PROT 5.1*  ALBUMIN 2.4*  AST 29  ALT 16  ALKPHOS 74  BILITOT 0.5    STUDIES: DG CHEST PORT 1 VIEW  Result Date: 05/21/2022 CLINICAL DATA:  88 year old female with chest pain, hypoxia, vomiting. EXAM: PORTABLE CHEST 1 VIEW COMPARISON:  CT Chest, Abdomen, and Pelvis yesterday, and earlier. FINDINGS: Portable AP semi upright view at 0400 hours. Continued low lung volumes. Stable mediastinal contours. Ongoing curvilinear opacity at the lung bases left greater than right, not significantly changed from the chest CT yesterday. No pneumothorax, pulmonary edema, or pleural effusion identified. Visualized tracheal air column is within normal limits. Paucity of bowel gas in the visible abdomen. Stable cholecystectomy clips. Stable visualized osseous structures. IMPRESSION: 1. Continued low lung volumes and bibasilar lung opacity, stable from the chest CT yesterday. Appearance favors atelectasis, but cannot exclude lung base infection. 2. No new  cardiopulmonary abnormality identified. Electronically Signed   By: Genevie Ann M.D.   On: 05/21/2022 04:07   CT CHEST ABDOMEN PELVIS WO CONTRAST  Result Date: 05/20/2022 CLINICAL DATA:  Abdominal pain, acute, nonlocalized.  Chest pain. EXAM: CT CHEST, ABDOMEN AND PELVIS WITHOUT CONTRAST TECHNIQUE: Multidetector CT imaging of the chest, abdomen and pelvis was performed following the standard protocol without IV contrast. RADIATION DOSE REDUCTION: This exam was performed according to the departmental dose-optimization program which includes automated exposure control, adjustment of the mA and/or kV according to patient size and/or use of iterative reconstruction technique. COMPARISON:  Chest CT 09/04/2021.  CT abdomen 07/17/2020. FINDINGS: CT CHEST FINDINGS Cardiovascular: Mild cardiomegaly. Coronary artery calcification and aortic atherosclerotic calcification. Mediastinum/Nodes: No mediastinal or hilar mass or lymphadenopathy. Lungs/Pleura: No pleural effusion. Patchy density at both lung bases left more than right could be scarring or bibasilar pneumonia. Lung apices show benign pleural and parenchymal scarring. Musculoskeletal: Old augmented compression fracture at T11 without further collapse. Chronic superior endplate fracture at T7 with loss of height of 30%. No progressive collapse. No new fracture. CT ABDOMEN PELVIS FINDINGS Hepatobiliary: No liver lesion seen on this study done without contrast. Previous cholecystectomy. Mild chronic ductal prominence. Pancreas: Motion degraded.  No abnormality seen. Spleen: Negative Adrenals/Urinary Tract: Adrenal glands are normal. No acute renal finding. No hydronephrosis. Bladder appears unremarkable. Stomach/Bowel: Stomach and small intestine are unremarkable. Normal appendix. Diverticulosis of the colon without visible diverticulitis. Rectal fecal impaction. Vascular/Lymphatic: Aortic atherosclerosis. No aneurysm. IVC is normal. No adenopathy. Reproductive: Previous  hysterectomy.  No pelvic mass. Other: No free fluid or air. Musculoskeletal: No acute lumbar spine or pelvic abnormality. Old vertebral augmentation at L1. Previous decompression and fusion L2 through L4. IMPRESSION: 1. Patchy density at both lung bases left more than right could be scarring or bibasilar pneumonia. 2. Cardiomegaly. Coronary artery calcification. Aortic atherosclerosis. 3. No acute or traumatic finding in the abdomen or pelvis. 4. Rectal fecal impaction. 5. Diverticulosis without visible diverticulitis. 6. Old augmented compression fracture at T11 without further collapse. Chronic superior endplate fracture at T7 with loss of height of 30%. No progressive collapse. No new fracture. Old vertebral augmentation at L1. Previous decompression and fusion L2 through L4. Aortic Atherosclerosis (ICD10-I70.0). Electronically Signed   By: Nelson Chimes M.D.   On: 05/20/2022 16:10   CT Head Wo Contrast  Result Date: 05/20/2022 CLINICAL DATA:  Altered mental status EXAM: CT HEAD WITHOUT CONTRAST TECHNIQUE: Contiguous axial images were obtained from the base of the skull through the vertex without intravenous contrast. RADIATION DOSE REDUCTION: This exam was performed according to the departmental  dose-optimization program which includes automated exposure control, adjustment of the mA and/or kV according to patient size and/or use of iterative reconstruction technique. COMPARISON:  04/30/21 CT head FINDINGS: Brain: No evidence of acute infarction, hemorrhage, hydrocephalus, extra-axial collection or mass lesion/mass effect. Sequela of severe chronic microvascular ischemic change, unchanged from prior exam. Vascular: No hyperdense vessel. Redemonstrated calcified atherosclerotic plaque in the proximal left M1. Skull: Normal. Negative for fracture or focal lesion. Sinuses/Orbits: Bilateral lens replacement. Trace left mastoid effusion. No middle ear effusion. Paranasal sinuses are clear. Other: There is a pannus  at the craniocervical junction. IMPRESSION: No acute intracranial abnormality. Electronically Signed   By: Marin Roberts M.D.   On: 05/20/2022 16:04   DG Chest Port 1 View  Result Date: 05/20/2022 CLINICAL DATA:  Chest pain, emesis and shortness of breath. EXAM: PORTABLE CHEST 1 VIEW COMPARISON:  09/03/2021; 08/06/2021; chest CT-09/04/2021 FINDINGS: Grossly unchanged cardiac silhouette and mediastinal contours. Grossly unchanged bibasilar heterogeneous opacities, left-greater-than-right. No new focal airspace opacities. No pleural effusion or pneumothorax. No evidence of edema. No acute osseous abnormalities. Degenerative change of the bilateral glenohumeral joints is suspected though incompletely evaluated. Postcholecystectomy. IMPRESSION: Similar findings of bibasilar atelectasis without superimposed acute cardiopulmonary disease on this AP portable examination. Electronically Signed   By: Sandi Mariscal M.D.   On: 05/20/2022 13:14      Impression / Plan:   Impression: 1.  Anemia: With hemoglobin dropped from 12.2-9.8 overnight, 2 large episodes of melena/dark bloody bowel movements, the last around 8:00 this morning, Heparin has been stopped as of 6:00 this morning which is likely exacerbating this; consider esophagitis which she has a history of versus gastritis versus PUD 2.  Acute hypercarbic and hypoxic respiratory failure 3.  Community-acquired pneumonia 4.  Acute metabolic encephalopathy 5.  AKI 6.  Nausea/vomiting and abdominal discomfort 7.  A-fib: Typically on Eliquis switched to Heparin at time of admission on 2/20-now on hold as of 6:00 this morning 8.  Fecal impaction: At time of admission, enema ordered by EDP 9.  Rheumatoid arthritis: Sulfasalazine on hold 10.  Chronic pain: Typically on Fentanyl  Plan: 1.  Patient did drink half of the Sprite at 9:00 this morning, she would be okay for procedures by 1:00 today, Heparin has been on hold since 6:00 this morning. 2.  Currently our  endoscopy schedule is full for today, but we are working on trying to move some things around to get this patient's EGD done today given her bleeding.  I am going to continue her n.p.o. for now, but we will let her have clears if we are unable to schedule her procedure. 3.  Did discuss an EGD including risks, benefits, limitations and alternatives and the patient and family agreed to proceed. 4.  Continue to monitor hemoglobin every 4 hours with transfusion as needed less than 7 5.  Okay to stay on Pantoprazole infusion at the moment  Thank you for your kind consultation, we will continue to follow.  Lavone Nian [facility]  05/22/2022, 8:19 AM

## 2022-05-22 NOTE — Progress Notes (Signed)
Patient had a large dark bm with some blood noted burgundy in color. Family concerned and at the bedside. Heparin drip remains off.

## 2022-05-22 NOTE — Procedures (Signed)
Patient Name: CAROLANNE FRANCISCO  MRN: [id number]  Epilepsy Attending: Lora Havens  Referring Physician/Provider: Elodia Florence., MD  Date: 2/23/204 Duration: 23.40 mins  Patient history: 88yo F with ams. EEG to evaluate for seizure.  Level of alertness: Awake, asleep  AEDs during EEG study: None  Technical aspects: This EEG study was done with scalp electrodes positioned according to the 10-20 International system of electrode placement. Electrical activity was reviewed with band pass filter of 1-'70Hz'$ , sensitivity of 7 uV/mm, display speed of 40m/sec with a '60Hz'$  notched filter applied as appropriate. EEG data were recorded continuously and digitally stored.  Video monitoring was available and reviewed as appropriate.  Description: The posterior dominant rhythm consists of 7 Hz activity of moderate voltage (25-35 uV) seen predominantly in posterior head regions, symmetric and reactive to eye opening and eye closing. Drowsiness was characterized by attenuation of the posterior background rhythm. Sleep was characterized by vertex waves, sleep spindles (12 to 14 Hz), maximal frontocentral region.  EEG showed continuous generalized 5 to 7 Hz theta slowing. Hyperventilation and photic stimulation were not performed.     ABNORMALITY - Continuous slow, generalized - Background slow  IMPRESSION: This study is suggestive of moderate diffuse encephalopathy, nonspecific etiology. No seizures or epileptiform discharges were seen throughout the recording.  Bakari Nikolai OBarbra Sarks

## 2022-05-22 NOTE — Evaluation (Signed)
Clinical/Bedside Swallow Evaluation Patient Details  Name: Betty Jackson MRN: [id number] Date of Birth: [birth year]  Today's Date: 05/22/2022 Time: SLP Start Time (ACUTE ONLY): 0902 SLP Stop Time (ACUTE ONLY): 0912 SLP Time Calculation (min) (ACUTE ONLY): 10 min  Past Medical History:  Past Medical History:  Diagnosis Date   Acute respiratory failure with hypoxia ([place]) 05/20/2017   Anemia, unspecified 10/28/2012   Anxiety state, unspecified 10/28/2012   CAD (coronary artery disease)    a. Stent RCA in [facility];  b. Cath approx 2009 - nonobs per pt report.   Cataract    immature on the left eye   Chronic insomnia 02/07/2013   Chronic lower back pain    scoliosis   CKD (chronic kidney disease) stage 3, GFR 30-59 ml/min (HCC)    CVA (cerebral infarction) 10/29/2012   DDD (degenerative disc disease)    Diverticulosis    GERD (gastroesophageal reflux disease) 09/01/2010   Hemorrhoids    Herniated nucleus pulposus, L5-S1, right 11/04/2015   Hyperlipidemia    takes Lipitor daily   Hypertension    takes Amlodipine,Losartan,Metoprolol,and HCTZ daily   Incontinence of urine    Insomnia    takes Restoril nightly   Lumbar stenosis 04/25/2013   Major depressive disorder, recurrent episode, moderate ([place]) 07/16/2013   Osteoporosis    PAF (paroxysmal atrial fibrillation) ([place]) 2011   a. lone epidode in 2011 according to notes.   Rheumatoid arthritis ([place])    Scoliosis    Sepsis ([place]) 05/2019   Stroke ([place])    Temporal arteritis ([place]) 2011   a. followed @ [place]; potential flareup 10/28/2012/notes 10/28/2012   Vocal cord dysfunction    "they don't operate properly" (10/28/2012)   Past Surgical History:  Past Surgical History:  Procedure Laterality Date   ABDOMINAL HYSTERECTOMY  ~ 1984   vaginally   BACK SURGERY  7-100yr ago   X Stop   BLADDER SUSPENSION  2001   BREAST BIOPSY Right    CATARACT EXTRACTION W/ INTRAOCULAR LENS IMPLANT Right ~ 08/2012   CHEST TUBE INSERTION Left  03/08/2019   Procedure: Chest Tube Insertion;  Surgeon: VIvin Poot MD;  [facility]: [facility]  Service: Thoracic;  Laterality: Left;   COLONOSCOPY  01/26/2012   Procedure: COLONOSCOPY;  Surgeon: MLadene Artist MD,FACG;  [facility]  Service: Endoscopy;  Laterality: N/A;  note the EGD is possible   COLONOSCOPY WITH PROPOFOL N/A 09/10/2021   Procedure: COLONOSCOPY WITH PROPOFOL;  Surgeon: GGatha Mayer MD;  [place] [facility]  Service: Gastroenterology;  Laterality: N/A;   CORONARY ANGIOPLASTY WITH STENT PLACEMENT  2006   X 1 stent   EPIDURAL BLOCK INJECTION     ESOPHAGOGASTRODUODENOSCOPY  01/26/2012   Procedure: ESOPHAGOGASTRODUODENOSCOPY (EGD);  Surgeon: MLadene Artist MD,FACG;  [facility]  Service: Endoscopy;  Laterality: N/A;   ESOPHAGOGASTRODUODENOSCOPY (EGD) WITH PROPOFOL N/A 08/05/2021   Procedure: ESOPHAGOGASTRODUODENOSCOPY (EGD) WITH PROPOFOL;  Surgeon: DDoran Stabler MD;  [facility]  Service: Gastroenterology;  Laterality: N/A;   ESOPHAGOGASTRODUODENOSCOPY (EGD) WITH PROPOFOL N/A 09/10/2021   Procedure: ESOPHAGOGASTRODUODENOSCOPY (EGD) WITH PROPOFOL;  Surgeon: GGatha Mayer MD;  [facility] [facility]  Service: Gastroenterology;  Laterality: N/A;   FINE NEEDLE ASPIRATION Right 09/26/2019   Procedure: FINE NEEDLE ASPIRATION;  Surgeon: HAltamese Rowena MD;  [place] [facility]  Service: Orthopedics;  Laterality: Right;   FOREIGN BODY REMOVAL  08/05/2021   Procedure: FOREIGN BODY REMOVAL;  Surgeon: DDoran Stabler MD;  Location:  [place] [facility];  Service: Gastroenterology;;   HEMIARTHROPLASTY HIP Right 2012   IR EPIDUROGRAPHY  04/20/2019   LAPAROSCOPIC CHOLECYSTECTOMY  2001   LUMBAR FUSION  03/2013   LUMBAR LAMINECTOMY/DECOMPRESSION MICRODISCECTOMY Right 11/04/2015   Procedure: Right Lumbar Five-Sacral One Microdiskectomy;  Surgeon: Kristeen Miss, MD;  Location: [facility];  Service: Neurosurgery;  Laterality: Right;  Right L5-S1  Microdiskectomy   moles removed that required stiches     one on leg and one on face   ORIF FEMUR FRACTURE Right 09/26/2019   Procedure: OPEN REDUCTION INTERNAL FIXATION (ORIF) DISTAL FEMUR FRACTURE;  Surgeon: Altamese Inger, MD;  Location: [place];  Service: Orthopedics;  Laterality: Right;   ORIF FEMUR FRACTURE Right 10/17/2019   Procedure: OPEN REDUCTION INTERNAL FIXATION (ORIF) DISTAL FEMUR FRACTURE;  Surgeon: Altamese Bee, MD;  Location: [place];  Service: Orthopedics;  Laterality: Right;   POLYPECTOMY  09/10/2021   Procedure: POLYPECTOMY;  Surgeon: Gatha Mayer, MD;  Location: [place];  Service: Gastroenterology;;   SUBXYPHOID PERICARDIAL WINDOW N/A 03/08/2019   Procedure: SUBXYPHOID PERICARDIAL WINDOW;  Surgeon: Ivin Poot, MD;  Location: [place];  Service: Thoracic;  Laterality: N/A;   TEE WITHOUT CARDIOVERSION N/A 03/08/2019   Procedure: TRANSESOPHAGEAL ECHOCARDIOGRAM (TEE);  Surgeon: Prescott Gum, Collier Salina, MD;  Location[facility];  Service: Thoracic;  Laterality: N/A;   TEMPORAL ARTERY BIOPSY / LIGATION Bilateral 2011   TONSILLECTOMY AND ADENOIDECTOMY     at age 5   [place]  ~ 26   [place]  ~ 2010   "lower back" (10/28/2012)   HPI:  [name] is a 88 y.o. female with medical history significant of chronic pain on chronic opiates, atrial fib, RA and temporal arteritis and other medical issues admitted with confusion,  acute metabolic encephalopathy and hypoxia.  She has evidence of pneumonia on chest CT. She has had bloody stools and will have EGD. BSE 02/2021 with eructation (h/o GERD), delayed mastication due to missing some dentition, regular/thin recommended. ST signed off the following day.    Assessment / Plan / Recommendation  Clinical Impression  Pt seen for swallow assessment without family present. She has upper dentures, strong cough and oromotor exam was unremarkable. Coughing when drinking Sprite when first arrived however she was somewhat reclined.  SLP repositioned her and she consumed straw sips thin throughout the evaluation without s/s aspiration and appeared to have adequate timing of swallow and respirations. She states she eats regular food at home "now that I got my teeth fixed" and masticated regular texture timely and completely. Pt educated to sit upright and take small bites/sips. SLP notified after swallow assessment that GI MD called RN and are planning an EGD today due to blood in her stools. Recommend pt initiate regular diet/thin liquids once cleared by GI. No further ST is warranted. SLP Visit Diagnosis: Dysphagia, unspecified (R13.10)    Aspiration Risk  Mild aspiration risk    Diet Recommendation Regular;Thin liquid (having EGD- initiating diet per GI findings)   Liquid Administration via: Cup;Straw Medication Administration: Whole meds with puree Supervision: Patient able to self feed;Intermittent supervision to cue for compensatory strategies Compensations: Slow rate;Small sips/bites Postural Changes: Seated upright at 90 degrees    Other  Recommendations Oral Care Recommendations: Oral care BID    Recommendations for follow up therapy are one component of a multi-disciplinary discharge planning process, led by the attending physician.  Recommendations may be updated based on patient status, additional functional criteria and insurance authorization.  Follow up Recommendations No SLP follow up      Assistance Recommended at Discharge    Functional Status Assessment Patient has not had a recent decline in their functional status  Frequency and Duration            Prognosis        Swallow Study   General Date of Onset: 05/20/22 HPI: MURIAH SAEFONG is a 88 y.o. female with medical history significant of chronic pain on chronic opiates, atrial fib, RA and temporal arteritis and other medical issues admitted with confusion,  acute metabolic encephalopathy and hypoxia.  She has evidence of pneumonia on chest CT.  She has had bloody stools and will have EGD. BSE 02/2021 with eructation (h/o GERD), delayed mastication due to missing some dentition, regular/thin recommended. ST signed off the following day. Type of Study: Bedside Swallow Evaluation Previous Swallow Assessment:  (see HPI) Diet Prior to this Study: NPO Temperature Spikes Noted: No Respiratory Status: Nasal cannula History of Recent Intubation: No Behavior/Cognition: Alert;Cooperative;Pleasant mood Oral Cavity Assessment: Within Functional Limits Oral Care Completed by SLP: No Oral Cavity - Dentition: Dentures, top Vision: Functional for self-feeding Self-Feeding Abilities: Able to feed self Patient Positioning: Upright in bed Baseline Vocal Quality: Normal Volitional Cough: Strong Volitional Swallow: Able to elicit    Oral/Motor/Sensory Function Overall Oral Motor/Sensory Function: Within functional limits   Ice Chips Ice chips: Not tested   Thin Liquid Thin Liquid: Within functional limits Presentation: Straw    Nectar Thick Nectar Thick Liquid: Not tested   Honey Thick Honey Thick Liquid: Not tested   Puree Puree: Within functional limits   Solid     Solid: Within functional limits      Houston Siren 05/22/2022,9:38 AM

## 2022-05-22 NOTE — Progress Notes (Addendum)
PROGRESS NOTE    Betty Jackson  [id number] DOB: 24-Nov-1934 DOA: 05/20/2022 PCP: Alvester Morin, MD  Chief Complaint  Patient presents with   Chest Pain   Emesis    Brief Narrative:   Betty Jackson is Betty Jackson 88 y.o. female with medical history significant of chronic pain on chronic opiates, atrial fib (on eliquis), RA and temporal arteritis (on chronic steroids) and other medical issues here with confusion.   She's been admitted with acute metabolic encephalopathy and hypoxia.  She has evidence of pneumonia on chest CT.  Currently being treated for acute hypoxic and hypercarbic respiratory failure due to pneumonia.  Also has encephalopathy related to respiratory failure with contribution from opiates.  Currently providing supportive care.  Complex situation as noted below and in prior notes.    Assessment & Plan:   Principal Problem:   Pneumonia  Goals of care See prior notes for additional details.  She's had significant improvement over past few days.  Currently DNR/DNI.  Palliative care has been consulted.  Family understands the difficult nature of balancing their mother's opiates/pain control/encephalopathy/respiratory issues.  Will continue lower dose fentanyl patch for now with prn IV pushes for breakthrough pain.  No narcan, family concerned for her comfort.  Palliative care c/s, appreciate recs  Maroon Stool  Concern for GI bleeding PPI gtt Holding heparin/anticoagulation Appreciate GI recs - EGD 2/24  Acute Hypercarbic and Hypoxic Respiratory Failure  Due to pneumonia, opiates/respiratory depression, encephalopathy Treating pna with abx Most recent VBG shows improved CO2, resolved respiratory acidosis.  Hold further bipap for now.  Consider use going forward based on overall clinical course.   Sepsis due to Community Acquired Pneumonia  CT chest abdomen pelvis with patchy density at both lung bases L>R (scarring vs pneumonia) 2/22 CXR with low lung volumes and  bibasilar opacity Improved, currently on RA (chronic home 2 L prn).  Persistent leukocytosis today (sepsis/infection + steroids).  Encephalopathy.  Fits criteria for sepsis with fever, leukocytosis, tachycardia, tachypnea, encephalopathy.  Continue ceftriaxone/azithromycin at this time Blood cultures pending, sputum cultures if able MRSA PCR RVP pending.  Negative covid, influenza, RSV Given her chronic steroid use at home, we've transitioned to IV steroids for her acute infection -> wean given improvement and bleeding   Acute Metabolic Encephalopathy Multifactorial -> pneumonia, hypercarbic resp failure, dehydration/AKI/hypernatremia, opiates/sedation --- suspect related to infection + developing AKI + opiates (there was thought that her norco dose was increased, but this doesn't seem to be the case) Follow b12 (wnl), folate (wnl), B1 (pending), VBG (resolved respiratory acidosis), ammonia (pending collection - will hold off for now, not drawn, but she's improved), TSH (elevated - follow free T4) Head CT without acute intracranial abnormality EEG negative for seizures MRI brain pending No narcan going forward. Much improved today, will get MRI given concern for slurred speech and word finding issues, but overall, this seems to have improved as well.  Still waxing/waning.  Suspect this represents delirium?  Acute Kidney Injury  Dehydration  Hypernatremia Suspect this represents volume depletion and dehydration in setting of her encephalopathy over the last few days Improved slightly today.  Continue IVF, will try with D5 1/2 NS.  Bolus 250 cc this AM.  UA bland CT without hydro  Continue to trend   Nausea  Vomiting  Chest Pain  Abdominal Discomfort Suspect related to pneumonia Negative troponins Follow, seems improved   Atrial Fibrillation Chadsvasc 6 Anticoagulation on hold with concern for GI bleeding Resume amiodarone and low dose  metop Hold diltiazem for now If issues with  rate, can start dilt gtt (I'd ordered dilt gtt with tachy last night, d/c'd with hypotension)   Fecal Impaction Enema ordered by EDP, follow   Chronic Pain Reduced dose fentanyl patch, with IV fentanyl for breakthrough  Hold norco Hold meloxicam while unable to take PO Family concerned about holding meds and prior issues with withdrawal, delirium, precipitated RVR, etc (discussed need to hold meds for now given current encephalopathy)   Rheumatoid Arthritis  Hx Temporal Arteritis Hold sulfasalazine IV steroids while unable to take PO   Gout Resume allopurinol  HLD  CAD lipitor Eliquis on hold while NPO     DVT prophylaxis: heparin gtt Code Status: DNR/DNI Family Communication: daughter, son, granddaughter Disposition:   Status is: Inpatient Remains inpatient appropriate because: continued need for inpatient care   Consultants:  Palliative care  Procedures:  none  Antimicrobials:  Anti-infectives (From admission, onward)    Start     Dose/Rate Route Frequency Ordered Stop   05/21/22 2000  azithromycin (ZITHROMAX) 500 mg in sodium chloride 0.9 % 250 mL IVPB        500 mg 250 mL/hr over 60 Minutes Intravenous Every 24 hours 05/20/22 2223 05/26/22 1959   05/21/22 1700  cefTRIAXone (ROCEPHIN) 2 g in sodium chloride 0.9 % 100 mL IVPB        2 g 200 mL/hr over 30 Minutes Intravenous Every 24 hours 05/20/22 2223 05/26/22 1659   05/20/22 2230  cefTRIAXone (ROCEPHIN) 2 g in sodium chloride 0.9 % 100 mL IVPB  Status:  Discontinued        2 g 200 mL/hr over 30 Minutes Intravenous Every 24 hours 05/20/22 2218 05/20/22 2221   05/20/22 2230  azithromycin (ZITHROMAX) 500 mg in sodium chloride 0.9 % 250 mL IVPB  Status:  Discontinued        500 mg 250 mL/hr over 60 Minutes Intravenous Every 24 hours 05/20/22 2218 05/20/22 2222   05/20/22 1630  cefTRIAXone (ROCEPHIN) 2 g in sodium chloride 0.9 % 100 mL IVPB        2 g 200 mL/hr over 30 Minutes Intravenous  Once 05/20/22 1619  05/20/22 1754   05/20/22 1630  azithromycin (ZITHROMAX) 500 mg in sodium chloride 0.9 % 250 mL IVPB        500 mg 250 mL/hr over 60 Minutes Intravenous  Once 05/20/22 1619 05/20/22 2024       Subjective: No pain Denies complaits  Objective: Vitals:   05/22/22 0958 05/22/22 0959 05/22/22 1000 05/22/22 1200  BP:    129/62  Pulse:      Resp: '14 14 13 '$ (!) 21  Temp:      TempSrc:    Oral  SpO2:    100%  Weight:      Height:       No intake or output data in the 24 hours ending 05/22/22 1421  Filed Weights   05/20/22 1237 05/22/22 0426  Weight: 78 kg 82.3 kg    Examination:  General: No acute distress. Cardiovascular: Heart sounds show Naiomi Musto regular rate, and rhythm.  Lungs: Clear to auscultation bilaterally  Abdomen: Soft, nontender, nondistended  Neurological: Alert and oriented.  Moving all extremities.  More alert and easier to understand today. Extremities: No clubbing or cyanosis. No edema.   Data Reviewed: I have personally reviewed following labs and imaging studies  CBC: Recent Labs  Lab 05/20/22 1316 05/20/22 1409 05/20/22 2310 05/20/22 2331 05/21/22 0600 05/21/22  [id number] 05/21/22 2116 05/22/22 0625 05/22/22 1025 05/22/22 1303  WBC 12.9*  --  24.7*  --  28.5*  --  25.1* 26.8*  --   --   NEUTROABS  --   --   --   --   --   --   --  25.2*  --   --   HGB 12.5   < > 12.9   < > 11.8* 12.2 9.9* 9.8* 11.1* 10.4*  HCT 41.3   < > 41.7   < > 39.1 36.0 32.9* 31.9* 34.8*  --   MCV 103.8*  --  103.0*  --  104.0*  --  103.8* 102.6*  --   --   PLT 202  --  178  --  189  --  183 185  --   --    < > = values in this interval not displayed.    Basic Metabolic Panel: Recent Labs  Lab 05/20/22 1316 05/20/22 1409 05/20/22 1425 05/20/22 2310 05/20/22 2331 05/21/22 0151 05/21/22 0600 05/21/22 0609 05/21/22 1519 05/22/22 0625  NA 147* 150*   < >  --    < > 149* 149* 150* 146* 142  K 3.7 3.6   < >  --    < > 3.7 3.8 3.8 4.6 4.1  CL 107 108  --   --   --   --  112*   --  111 111  CO2 28  --   --   --   --   --  22  --  25 25  GLUCOSE 123* 121*  --   --   --   --  173*  --  281* 174*  BUN 31* 33*  --   --   --   --  31*  --  38* 42*  CREATININE 1.65* 1.70*  --  1.82*  --   --  2.30*  --  2.58* 2.43*  CALCIUM 9.3  --   --   --   --   --  8.6*  --  7.9* 7.4*  MG  --   --   --   --   --   --   --   --   --  1.5*  PHOS  --   --   --   --   --   --   --   --   --  3.3   < > = values in this interval not displayed.    GFR: Estimated Creatinine Clearance: 17.6 mL/min (Arham Symmonds) (by C-G formula based on SCr of 2.43 mg/dL (H)).  Liver Function Tests: Recent Labs  Lab 05/20/22 1316 05/21/22 0600 05/22/22 0625  AST 24 41 29  ALT '14 19 16  '$ ALKPHOS 81 105 74  BILITOT 1.4* 1.0 0.5  PROT 6.5 5.7* 5.1*  ALBUMIN 3.4* 2.7* 2.4*    CBG: Recent Labs  Lab 05/21/22 0353 05/21/22 2026  GLUCAP 155* 217*     Recent Results (from the past 240 hour(s))  Resp panel by RT-PCR (RSV, Flu Mousa Prout&B, Covid) Anterior Nasal Swab     Status: None   Collection Time: 05/20/22 12:41 PM   Specimen: Anterior Nasal Swab  Result Value Ref Range Status   SARS Coronavirus 2 by RT PCR NEGATIVE NEGATIVE Final   Influenza Alohilani Levenhagen by PCR NEGATIVE NEGATIVE Final   Influenza B by PCR NEGATIVE NEGATIVE Final    Comment: (NOTE) The Xpert Xpress SARS-CoV-2/FLU/RSV plus assay is intended  as an aid in the diagnosis of influenza from Nasopharyngeal swab specimens and should not be used as Lamonta Cypress sole basis for treatment. Nasal washings and aspirates are unacceptable for Xpert Xpress SARS-CoV-2/FLU/RSV testing.  Fact Sheet for Patients: EntrepreneurPulse.com.au  Fact Sheet for Healthcare Providers: IncredibleEmployment.be  This test is not yet approved or cleared by the [place] FDA and has been authorized for detection and/or diagnosis of SARS-CoV-2 by FDA under an Emergency Use Authorization (EUA). This EUA will remain in effect (meaning this test can be  used) for the duration of the COVID-19 declaration under Section 564(b)(1) of the Act, 21 U.S.C. section 360bbb-3(b)(1), unless the authorization is terminated or revoked.     Resp Syncytial Virus by PCR NEGATIVE NEGATIVE Final    Comment: (NOTE) Fact Sheet for Patients: EntrepreneurPulse.com.au  Fact Sheet for Healthcare Providers: IncredibleEmployment.be  This test is not yet approved or cleared by the [place] FDA and has been authorized for detection and/or diagnosis of SARS-CoV-2 by FDA under an Emergency Use Authorization (EUA). This EUA will remain in effect (meaning this test can be used) for the duration of the COVID-19 declaration under Section 564(b)(1) of the Act, 21 U.S.C. section 360bbb-3(b)(1), unless the authorization is terminated or revoked.  Performed at [facility], [place] [address] [place], [place] [address]   Respiratory (~20 pathogens) panel by PCR     Status: None   Collection Time: 05/20/22  1:48 PM   Specimen: Nasopharyngeal Swab; Respiratory  Result Value Ref Range Status   Adenovirus NOT DETECTED NOT DETECTED Final   Coronavirus 229E NOT DETECTED NOT DETECTED Final    Comment: (NOTE) The Coronavirus on the Respiratory Panel, DOES NOT test for the novel  Coronavirus (2019 nCoV)    Coronavirus HKU1 NOT DETECTED NOT DETECTED Final   Coronavirus NL63 NOT DETECTED NOT DETECTED Final   Coronavirus OC43 NOT DETECTED NOT DETECTED Final   Metapneumovirus NOT DETECTED NOT DETECTED Final   Rhinovirus / Enterovirus NOT DETECTED NOT DETECTED Final   Influenza [place] NOT DETECTED NOT DETECTED Final   Influenza B NOT DETECTED NOT DETECTED Final   Parainfluenza Virus 1 NOT DETECTED NOT DETECTED Final   Parainfluenza Virus 2 NOT DETECTED NOT DETECTED Final   Parainfluenza Virus 3 NOT DETECTED NOT DETECTED Final   Parainfluenza Virus 4 NOT DETECTED NOT DETECTED Final   Respiratory Syncytial Virus NOT DETECTED NOT  DETECTED Final   Bordetella pertussis NOT DETECTED NOT DETECTED Final   Bordetella Parapertussis NOT DETECTED NOT DETECTED Final   Chlamydophila pneumoniae NOT DETECTED NOT DETECTED Final   Mycoplasma pneumoniae NOT DETECTED NOT DETECTED Final    Comment: Performed at [facility], [place]. [address] [place], [place] [address]  Culture, blood (routine x 2) Call MD if unable to obtain prior to antibiotics being given     Status: None (Preliminary result)   Collection Time: 05/20/22 10:38 PM   Specimen: BLOOD RIGHT HAND  Result Value Ref Range Status   Specimen Description BLOOD RIGHT HAND  Final   Special Requests   Final    BOTTLES DRAWN AEROBIC AND ANAEROBIC Blood Culture results may not be optimal due to an inadequate volume of blood received in culture bottles   Culture   Final    NO GROWTH 2 DAYS Performed at [facility], [place] [address] [place],  [address]    Report Status PENDING  Incomplete  Culture, blood (routine x 2) Call MD if unable to obtain prior  to antibiotics being given     Status: None (Preliminary result)   Collection Time: 05/20/22 10:38 PM   Specimen: BLOOD LEFT HAND  Result Value Ref Range Status   Specimen Description BLOOD LEFT HAND  Final   Special Requests   Final    BOTTLES DRAWN AEROBIC AND ANAEROBIC Blood Culture results may not be optimal due to an inadequate volume of blood received in culture bottles   Culture   Final    NO GROWTH 2 DAYS Performed at [facility], [place] [address], [place], [place] [address]    Report Status PENDING  Incomplete         Radiology Studies: EEG adult  Result Date: 2022/06/14 Lora Havens, MD     Jun 14, 2022 10:46 AM Patient Name: YUHAN MORSEY MRN: [id number] Epilepsy Attending: Lora Havens Referring Physician/Provider: Elodia Florence., MD Date: 2/23/204 Duration: 23.40 mins Patient history: 88yo F with ams. EEG to evaluate for seizure. Level of alertness: Awake, asleep AEDs during  EEG study: None Technical aspects: This EEG study was done with scalp electrodes positioned according to the 10-20 International system of electrode placement. Electrical activity was reviewed with band pass filter of 1-'70Hz'$ , sensitivity of 7 uV/mm, display speed of 28m/sec with Keatin Benham '60Hz'$  notched filter applied as appropriate. EEG data were recorded continuously and digitally stored.  Video monitoring was available and reviewed as appropriate. Description: The posterior dominant rhythm consists of 7 Hz activity of moderate voltage (25-35 uV) seen predominantly in posterior head regions, symmetric and reactive to eye opening and eye closing. Drowsiness was characterized by attenuation of the posterior background rhythm. Sleep was characterized by vertex waves, sleep spindles (12 to 14 Hz), maximal frontocentral region.  EEG showed continuous generalized 5 to 7 Hz theta slowing. Hyperventilation and photic stimulation were not performed.   ABNORMALITY - Continuous slow, generalized - Background slow IMPRESSION: This study is suggestive of moderate diffuse encephalopathy, nonspecific etiology. No seizures or epileptiform discharges were seen throughout the recording. PLora Havens  DG CHEST PORT 1 VIEW  Result Date: 05/21/2022 CLINICAL DATA:  88year old female with chest pain, hypoxia, vomiting. EXAM: PORTABLE CHEST 1 VIEW COMPARISON:  CT Chest, Abdomen, and Pelvis yesterday, and earlier. FINDINGS: Portable AP semi upright view at 0400 hours. Continued low lung volumes. Stable mediastinal contours. Ongoing curvilinear opacity at the lung bases left greater than right, not significantly changed from the chest CT yesterday. No pneumothorax, pulmonary edema, or pleural effusion identified. Visualized tracheal air column is within normal limits. Paucity of bowel gas in the visible abdomen. Stable cholecystectomy clips. Stable visualized osseous structures. IMPRESSION: 1. Continued low lung volumes and bibasilar lung  opacity, stable from the chest CT yesterday. Appearance favors atelectasis, but cannot exclude lung base infection. 2. No new cardiopulmonary abnormality identified. Electronically Signed   By: HGenevie AnnM.D.   On: 05/21/2022 04:07   CT CHEST ABDOMEN PELVIS WO CONTRAST  Result Date: 05/20/2022 CLINICAL DATA:  Abdominal pain, acute, nonlocalized.  Chest pain. EXAM: CT CHEST, ABDOMEN AND PELVIS WITHOUT CONTRAST TECHNIQUE: Multidetector CT imaging of the chest, abdomen and pelvis was performed following the standard protocol without IV contrast. RADIATION DOSE REDUCTION: This exam was performed according to the departmental dose-optimization program which includes automated exposure control, adjustment of the mA and/or kV according to patient size and/or use of iterative reconstruction technique. COMPARISON:  Chest CT 09/04/2021.  CT abdomen 07/17/2020. FINDINGS: CT CHEST FINDINGS Cardiovascular: Mild cardiomegaly. Coronary artery calcification  and aortic atherosclerotic calcification. Mediastinum/Nodes: No mediastinal or hilar mass or lymphadenopathy. Lungs/Pleura: No pleural effusion. Patchy density at both lung bases left more than right could be scarring or bibasilar pneumonia. Lung apices show benign pleural and parenchymal scarring. Musculoskeletal: Old augmented compression fracture at T11 without further collapse. Chronic superior endplate fracture at T7 with loss of height of 30%. No progressive collapse. No new fracture. CT ABDOMEN PELVIS FINDINGS Hepatobiliary: No liver lesion seen on this study done without contrast. Previous cholecystectomy. Mild chronic ductal prominence. Pancreas: Motion degraded.  No abnormality seen. Spleen: Negative Adrenals/Urinary Tract: Adrenal glands are normal. No acute renal finding. No hydronephrosis. Bladder appears unremarkable. Stomach/Bowel: Stomach and small intestine are unremarkable. Normal appendix. Diverticulosis of the colon without visible diverticulitis. Rectal  fecal impaction. Vascular/Lymphatic: Aortic atherosclerosis. No aneurysm. IVC is normal. No adenopathy. Reproductive: Previous hysterectomy.  No pelvic mass. Other: No free fluid or air. Musculoskeletal: No acute lumbar spine or pelvic abnormality. Old vertebral augmentation at L1. Previous decompression and fusion L2 through L4. IMPRESSION: 1. Patchy density at both lung bases left more than right could be scarring or bibasilar pneumonia. 2. Cardiomegaly. Coronary artery calcification. Aortic atherosclerosis. 3. No acute or traumatic finding in the abdomen or pelvis. 4. Rectal fecal impaction. 5. Diverticulosis without visible diverticulitis. 6. Old augmented compression fracture at T11 without further collapse. Chronic superior endplate fracture at T7 with loss of height of 30%. No progressive collapse. No new fracture. Old vertebral augmentation at L1. Previous decompression and fusion L2 through L4. Aortic Atherosclerosis (ICD10-I70.0). Electronically Signed   By: Nelson Chimes M.D.   On: 05/20/2022 16:10   CT Head Wo Contrast  Result Date: 05/20/2022 CLINICAL DATA:  Altered mental status EXAM: CT HEAD WITHOUT CONTRAST TECHNIQUE: Contiguous axial images were obtained from the base of the skull through the vertex without intravenous contrast. RADIATION DOSE REDUCTION: This exam was performed according to the departmental dose-optimization program which includes automated exposure control, adjustment of the mA and/or kV according to patient size and/or use of iterative reconstruction technique. COMPARISON:  04/30/21 CT head FINDINGS: Brain: No evidence of acute infarction, hemorrhage, hydrocephalus, extra-axial collection or mass lesion/mass effect. Sequela of severe chronic microvascular ischemic change, unchanged from prior exam. Vascular: No hyperdense vessel. Redemonstrated calcified atherosclerotic plaque in the proximal left M1. Skull: Normal. Negative for fracture or focal lesion. Sinuses/Orbits: Bilateral  lens replacement. Trace left mastoid effusion. No middle ear effusion. Paranasal sinuses are clear. Other: There is Mianna Iezzi pannus at the craniocervical junction. IMPRESSION: No acute intracranial abnormality. Electronically Signed   By: Marin Roberts M.D.   On: 05/20/2022 16:04        Scheduled Meds:  Chlorhexidine Gluconate Cloth  6 each Topical Daily   fentaNYL  1 patch Transdermal Q72H   [START ON 05/25/2022] pantoprazole  40 mg Intravenous Q12H   sodium phosphate  1 enema Rectal Once   [START ON 05/24/2022] thiamine (VITAMIN B1) injection  100 mg Intravenous Daily   Continuous Infusions:  azithromycin 500 mg (05/21/22 2220)   cefTRIAXone (ROCEPHIN)  IV 2 g (05/21/22 1656)   pantoprazole 8 mg/hr (05/22/22 1202)   thiamine (VITAMIN B1) injection 500 mg (05/22/22 1152)     LOS: 2 days    Time spent: over 30 min    Fayrene Helper, MD [facility]   To contact the attending provider between 7A-7P or the covering provider during after hours 7P-7A, please log into the web site [email or web address] and access using universal [place] password for  that web site. If you do not have the password, please call the hospital operator.  05/22/2022, 2:21 PM

## 2022-05-22 NOTE — Consult Note (Signed)
Consultation Note Date: 05/22/2022   Patient Name: Betty Jackson  DOB: July 06, 1934  MRN: [id number]  Age / Sex: 88 y.o., female  PCP: Alvester Morin, MD Referring Physician: Elodia Florence., *  Reason for Consultation: {Reason for Consult:23484}  HPI/Patient Profile: 88 y.o. female  with past medical history of *** admitted on 05/20/2022 with ***.     Clinical Assessment and Goals of Care: I have reviewed medical records including EPIC notes, labs, and imaging. Received report from primary RN - ***   Went to visit patient at bedside - *** family/visitors present. Patient was lying in bed awake, alert, oriented, and able to participate in conversation***. No signs or non-verbal gestures of pain or discomfort noted. No respiratory distress, increased work of breathing, or secretions noted. ***  Met with ***  to discuss diagnosis, prognosis, GOC, EOL wishes, disposition, and options.  I introduced Palliative Medicine as specialized medical care for people living with serious illness. It focuses on providing relief from the symptoms and stress of a serious illness. The goal is to improve quality of life for both the patient and the family.  We discussed a brief life review of the patient as well as functional and nutritional status.   We discussed patient's current illness and what it means in the larger context of patient's on-going co-morbidities.  Natural disease trajectory and expectations at EOL were discussed. I attempted to elicit values and goals of care important to the patient. The difference between aggressive medical intervention and comfort care was considered in light of the patient's goals of care.   Hospice and Palliative Care services outpatient were explained and offered.  Advance directives, concepts specific to code status, artificial feeding and hydration, and rehospitalization  were considered and discussed.  Visit also consisted of discussions dealing with the complex and emotionally intense issues of symptom management and palliative care in the setting of serious and potentially life-threatening illness. Palliative care team will continue to support patient, patient's family, and medical team.  Discussed with patient/family the importance of continued conversation with each other*** and the medical providers regarding overall plan of care and treatment options, ensuring decisions are within the context of the patient's values and GOCs.    Questions and concerns were addressed. The patient/family was encouraged to call with questions and/or concerns. PMT card was provided.   Primary Decision Maker: {Primary Decision [id number]    SUMMARY OF RECOMMENDATIONS   Continue to treat the treatable Continue DNR/DNI as previously documented Plan for EGD tomorrow No narcan. In the event of decline, family open for continued discussion around transition to full comfort. They will be discussing further this evening Continue current pain regimen - patient's pain is well managed at this time PMT will continue to follow and support holistically    Code Status/Advance Care Planning: {Palliative Code status:23503}  Palliative Prophylaxis:  {Palliative Prophylaxis:21015}  Additional Recommendations (Limitations, Scope, Preferences): {Recommended Scope and Preferences:21019}  Psycho-social/Spiritual:  Desire for further Chaplaincy support:{YES NO:22349} Created space and opportunity for patient and family*** to express thoughts and feelings regarding patient's current medical situation.  Emotional support and therapeutic listening provided.  Prognosis:  {Palliative Care Prognosis:23504}  Discharge Planning: {Palliative dispostion:23505}      Primary Diagnoses: Present on Admission:  Pneumonia   I have reviewed the medical record, interviewed the patient and  family, and examined the patient. The following aspects are pertinent.  Past Medical History:  Diagnosis Date   Acute respiratory failure with hypoxia ([place])  05/20/2017   Anemia, unspecified 10/28/2012   Anxiety state, unspecified 10/28/2012   CAD (coronary artery disease)    a. Stent RCA in [facility];  b. Cath approx 2009 - nonobs per pt report.   Cataract    immature on the left eye   Chronic insomnia 02/07/2013   Chronic lower back pain    scoliosis   CKD (chronic kidney disease) stage 3, GFR 30-59 ml/min (HCC)    CVA (cerebral infarction) 10/29/2012   DDD (degenerative disc disease)    Diverticulosis    GERD (gastroesophageal reflux disease) 09/01/2010   Hemorrhoids    Herniated nucleus pulposus, L5-S1, right 11/04/2015   Hyperlipidemia    takes Lipitor daily   Hypertension    takes Amlodipine,Losartan,Metoprolol,and HCTZ daily   Incontinence of urine    Insomnia    takes Restoril nightly   Lumbar stenosis 04/25/2013   Major depressive disorder, recurrent episode, moderate ([place]) 07/16/2013   Osteoporosis    PAF (paroxysmal atrial fibrillation) ([place]) 2011   a. lone epidode in 2011 according to notes.   Rheumatoid arthritis ([place])    Scoliosis    Sepsis ([place]) 05/2019   Stroke ([place])    Temporal arteritis ([place]) 2011   a. followed @ [facility]; potential flareup 10/28/2012/notes 10/28/2012   Vocal cord dysfunction    "they don't operate properly" (10/28/2012)   Social History   Socioeconomic History   Marital status: Married    Spouse name: Not on file   Number of children: 3   Years of education: Not on file   Highest education level: Not on file  Occupational History   Occupation: Retired Cabin crew  Tobacco Use   Smoking status: Never   Smokeless tobacco: Never  Vaping Use   Vaping Use: Never used  Substance and Sexual Activity   Alcohol use: Yes    Comment: socially   Drug use: No   Sexual activity: Not on file  Other Topics Concern   Not on file  Social  History Narrative   Lives at [place] facility, [facility]   Right Handed   Drinks 1-2 cups caffeine daily   Social Determinants of Health   Financial Resource Strain: Low Risk  (07/16/2020)   Overall Financial Resource Strain (CARDIA)    Difficulty of Paying Living Expenses: Not hard at all  Food Insecurity: No Food Insecurity (05/21/2022)   Hunger Vital Sign    Worried About Running Out of Food in the Last Year: Never true    Justice in the Last Year: Never true  Transportation Needs: No Transportation Needs (05/21/2022)   PRAPARE - Hydrologist (Medical): No    Lack of Transportation (Non-Medical): No  Physical Activity: Inactive (07/16/2020)   Exercise Vital Sign    Days of Exercise per Week: 0 days    Minutes of Exercise per Session: 0 min  Stress: Not on file  Social Connections: Unknown (07/16/2020)   Social Connection and Isolation Panel [NHANES]    Frequency of Communication with Friends and Family: More than three times a week    Frequency of Social Gatherings with Friends and Family: More than three times a week    Attends Religious Services: Never    Marine scientist or Organizations: No    Attends Archivist Meetings: Never    Marital Status: Patient refused   Family History  Problem Relation Age of Onset   Brain cancer Mother 83  Heart attack Father    Breast cancer Daughter 21   Heart disease Other    Hypertension Other    Arthritis Neg Hx    Colon cancer Neg Hx    Osteoporosis Neg Hx    Scheduled Meds:  allopurinol  100 mg Oral Daily   amiodarone  200 mg Oral Daily   atorvastatin  80 mg Oral QHS   Chlorhexidine Gluconate Cloth  6 each Topical Daily   escitalopram  10 mg Oral Daily   fentaNYL  1 patch Transdermal Q72H   loratadine  10 mg Oral Daily   metoprolol tartrate  12.5 mg Oral BID   [START ON 05/25/2022] pantoprazole  40 mg Intravenous Q12H   [START ON 05/23/2022] predniSONE  40 mg Oral Q  breakfast   Followed by   Derrill Memo ON 05/24/2022] predniSONE  30 mg Oral Q breakfast   Followed by   Derrill Memo ON 05/25/2022] predniSONE  20 mg Oral Q breakfast   Followed by   Derrill Memo ON 05/26/2022] predniSONE  10 mg Oral Q breakfast   sodium phosphate  1 enema Rectal Once   [START ON 05/24/2022] thiamine (VITAMIN B1) injection  100 mg Intravenous Daily   Continuous Infusions:  azithromycin 500 mg (05/21/22 2220)   cefTRIAXone (ROCEPHIN)  IV 2 g (05/21/22 1656)   pantoprazole 8 mg/hr (05/22/22 1202)   thiamine (VITAMIN B1) injection 500 mg (05/22/22 1152)   PRN Meds:.acetaminophen **OR** acetaminophen, albuterol, fentaNYL (SUBLIMAZE) injection, hydrocortisone Medications Prior to Admission:  Prior to Admission medications   Medication Sig Start Date End Date Taking? Authorizing Provider  acetaminophen (TYLENOL) 500 MG tablet Take 500 mg by mouth every 6 (six) hours as needed for mild pain.   Yes [provider]  allopurinol (ZYLOPRIM) 100 MG tablet Take 100 mg by mouth daily.   Yes [provider]  amiodarone (PACERONE) 200 MG tablet Take '200mg'$  twice daily for 5 days, then take '200mg'$  once daily. Patient taking differently: Take 200 mg by mouth daily. 08/11/21  Yes Bonnielee Haff, MD  atorvastatin (LIPITOR) 80 MG tablet Take 1 tablet (80 mg total) by mouth daily. Patient taking differently: Take 80 mg by mouth at bedtime. 09/25/20  Yes Garvin Fila, MD  cetirizine (ZYRTEC) 10 MG tablet Take 10 mg by mouth at bedtime.   Yes [provider]  cholecalciferol (VITAMIN D) 25 MCG tablet Take 2 tablets (2,000 Units total) by mouth 2 (two) times daily. 09/29/19  Yes Ainsley Spinner, PA-C  diltiazem (CARDIZEM CD) 240 MG 24 hr capsule Take 240 mg by mouth every evening. 10/10/21  Yes [provider]  ELIQUIS 5 MG TABS tablet TAKE 1 TABLET BY MOUTH TWICE A DAY Patient taking differently: Take 5 mg by mouth 2 (two) times daily. 05/09/20  Yes Burnell Blanks, MD   escitalopram (LEXAPRO) 10 MG tablet TAKE 1 TABLET BY MOUTH EVERY DAY Patient taking differently: Take 10 mg by mouth daily. 08/27/20  Yes Burchette, Alinda Sierras, MD  fentaNYL 37.5 MCG/HR PT72 Place 1 patch onto the skin every 3 (three) days. 08/10/21  Yes Bonnielee Haff, MD  ferrous sulfate 325 (65 FE) MG tablet Take 1 tablet (325 mg total) by mouth 2 (two) times daily with a meal. 04/21/19  Yes Darrick Meigs, Marge Duncans, MD  furosemide (LASIX) 40 MG tablet Take 40 mg by mouth daily.   Yes [provider]  furosemide (LASIX) 40 MG tablet Take 1 tablet (40 mg total) by mouth daily as needed (extra dose, for  weight gain of 2 pounds overnight or 5 pound in 1 week). 09/11/21  Yes Barton Dubois, MD  HYDROcodone-acetaminophen (NORCO) 5-325 MG tablet Take 1 tablet by mouth every 12 (twelve) hours as needed for severe pain. 09/11/21  Yes Barton Dubois, MD  ipratropium-albuterol (DUONEB) 0.5-2.5 (3) MG/3ML SOLN Take 3 mLs by nebulization every 4 (four) hours as needed. Patient taking differently: Take 3 mLs by nebulization every 4 (four) hours as needed (wheezing/shortness of breath). 08/10/21  Yes Bonnielee Haff, MD  meloxicam (MOBIC) 7.5 MG tablet Take 7.5 mg by mouth daily.   Yes [provider]  metoprolol tartrate (LOPRESSOR) 100 MG tablet Take 100 mg by mouth 2 (two) times daily.   Yes [provider]  Multiple Vitamin (MULTIVITAMIN WITH MINERALS) TABS tablet Take 1 tablet by mouth daily. 10/21/19  Yes Ainsley Spinner, PA-C  nitroGLYCERIN (NITROSTAT) 0.4 MG SL tablet Place 1 tablet (0.4 mg total) under the tongue every 5 (five) minutes as needed for chest pain. 02/25/22  Yes Burnell Blanks, MD  Nutritional Supplement LIQD Take 120 mLs by mouth daily. House stock supplement   Yes [provider]  ondansetron (ZOFRAN) 4 MG tablet Take 4 mg by mouth every 8 (eight) hours as needed for nausea or vomiting.   Yes [provider]  OXYGEN Inhale 2 L/min into the lungs as needed  (maintain SATS above 90%).   Yes [provider]  pantoprazole (PROTONIX) 40 MG tablet Take 1 tablet (40 mg total) by mouth 2 (two) times daily. 08/11/21  Yes Bonnielee Haff, MD  polyethylene glycol (MIRALAX) 17 g packet Take 17 g by mouth daily. Patient taking differently: Take 17 g by mouth daily as needed for mild constipation. 09/11/21  Yes Barton Dubois, MD  potassium chloride (KLOR-CON M) 10 MEQ tablet Take 3 tablets (30 mEq total) by mouth daily. 09/11/21  Yes Barton Dubois, MD  Pramox-PE-Glycerin-Petrolatum (HEMORRHOIDAL MAX STR) 1-0.25-14.4-15 % CREA Apply 1 Application topically 2 (two) times daily as needed (hemorrhoidal pain/discomfort.). 09/11/21  Yes Barton Dubois, MD  predniSONE (DELTASONE) 10 MG tablet Take 1 tablet (10 mg total) by mouth daily with breakfast. Resume after 3 days of '20mg'$  daily as instructed. 08/15/21  Yes Bonnielee Haff, MD  QUEtiapine (SEROQUEL) 25 MG tablet Take 1 tablet (25 mg total) by mouth at bedtime. 03/16/21 05/20/22 Yes Pahwani, Einar Grad, MD  sulfaSALAzine (AZULFIDINE) 500 MG EC tablet Take 500 mg by mouth daily.   Yes [provider]   Allergies  Allergen Reactions   Alendronate Sodium     Suspected cause of severe esophagitis (per EGD 08/05/21)   Dextromethorphan Rash   Review of Systems  Physical Exam  Vital Signs: BP 129/62 (BP Location: Right Arm)   Pulse 85   Temp 99.1 F (37.3 C) (Oral)   Resp (!) 21   Ht '5\' 4"'$  (1.626 m)   Wt 82.3 kg   SpO2 100%   BMI 31.14 kg/m  Pain Scale: 0-10   Pain Score: 0-No pain   SpO2: SpO2: 100 % O2 Device:SpO2: 100 % O2 Flow Rate: .O2 Flow Rate (L/min): 2 L/min  IO: Intake/output summary: No intake or output data in the 24 hours ending 05/22/22 1533  LBM: Last BM Date : 05/22/22 Baseline Weight: Weight: 78 kg Most recent weight: Weight: 82.3 kg     Palliative Assessment/Data:     Time In: *** Time Out: *** Time Total: *** Greater than 50%  of this time was spent counseling and  coordinating care  related to the above assessment and plan.  Signed by: Lin Landsman, NP   Please contact Palliative Medicine Team phone at [phone] for questions and concerns.  For individual provider: See Amion  *Portions of this note are a verbal dictation therefore any spelling and/or grammatical errors are due to the "[employer] [employer]" system interpretation.

## 2022-05-22 NOTE — Progress Notes (Signed)
ANTICOAGULATION CONSULT NOTE  Pharmacy Consult for heparin Indication: atrial fibrillation  Allergies  Allergen Reactions   Alendronate Sodium     Suspected cause of severe esophagitis (per EGD 08/05/21)   Dextromethorphan Rash    Patient Measurements: Height: '5\' 4"'$  (162.6 cm) Weight: 82.3 kg (181 lb 7 oz) IBW/kg (Calculated) : 54.7 Heparin Dosing Weight: 71 kg  Vital Signs: Temp: 98.2 F (36.8 C) (02/23 0253) Temp Source: Oral (02/23 0253) BP: 110/48 (02/23 0253) Pulse Rate: 73 (02/23 0253)  Labs: Recent Labs    05/20/22 1316 05/20/22 1409 05/20/22 1458 05/20/22 1819 05/21/22 0600 05/21/22 0609 05/21/22 0843 05/21/22 1146 05/21/22 1519 05/21/22 2029 05/21/22 2116 05/22/22 0358 05/22/22 0625  HGB 12.5   < >  --    < > 11.8* 12.2  --   --   --   --  9.9*  --  9.8*  HCT 41.3   < >  --    < > 39.1 36.0  --   --   --   --  32.9*  --  31.9*  PLT 202  --   --    < > 189  --   --   --   --   --  183  --  185  APTT  --   --   --   --   --   --  >200*   < >  --  112*  --  84* 82*  HEPARINUNFRC  --   --   --   --   --   --  >1.10*  --   --   --   --  >1.10*  --   CREATININE 1.65*   < >  --    < > 2.30*  --   --   --  2.58*  --   --   --  2.43*  TROPONINIHS 13  --  13  --   --   --   --   --   --   --   --   --   --    < > = values in this interval not displayed.     Estimated Creatinine Clearance: 17.6 mL/min (A) (by C-G formula based on SCr of 2.43 mg/dL (H)).   Medical History: Past Medical History:  Diagnosis Date   Acute respiratory failure with hypoxia ([place]) 05/20/2017   Anemia, unspecified 10/28/2012   Anxiety state, unspecified 10/28/2012   CAD (coronary artery disease)    a. Stent RCA in [facility];  b. Cath approx 2009 - nonobs per pt report.   Cataract    immature on the left eye   Chronic insomnia 02/07/2013   Chronic lower back pain    scoliosis   CKD (chronic kidney disease) stage 3, GFR 30-59 ml/min (HCC)    CVA (cerebral infarction)  10/29/2012   DDD (degenerative disc disease)    Diverticulosis    GERD (gastroesophageal reflux disease) 09/01/2010   Hemorrhoids    Herniated nucleus pulposus, L5-S1, right 11/04/2015   Hyperlipidemia    takes Lipitor daily   Hypertension    takes Amlodipine,Losartan,Metoprolol,and HCTZ daily   Incontinence of urine    Insomnia    takes Restoril nightly   Lumbar stenosis 04/25/2013   Major depressive disorder, recurrent episode, moderate ([place]) 07/16/2013   Osteoporosis    PAF (paroxysmal atrial fibrillation) ([place]) 2011   a. lone epidode in 2011 according to notes.   Rheumatoid arthritis (  [place])    Scoliosis    Sepsis ([place]) 05/2019   Stroke [facility])    Temporal arteritis ([place]) 2011   a. followed @ [place]; potential flareup 10/28/2012/notes 10/28/2012   Vocal cord dysfunction    "they don't operate properly" (10/28/2012)    Medications:  Medications Prior to Admission  Medication Sig Dispense Refill Last Dose   acetaminophen (TYLENOL) 500 MG tablet Take 500 mg by mouth every 6 (six) hours as needed for mild pain.   unknown   allopurinol (ZYLOPRIM) 100 MG tablet Take 100 mg by mouth daily.   05/19/2022   amiodarone (PACERONE) 200 MG tablet Take '200mg'$  twice daily for 5 days, then take '200mg'$  once daily. (Patient taking differently: Take 200 mg by mouth daily.)   05/19/2022   atorvastatin (LIPITOR) 80 MG tablet Take 1 tablet (80 mg total) by mouth daily. (Patient taking differently: Take 80 mg by mouth at bedtime.) 30 tablet 3 05/19/2022   cetirizine (ZYRTEC) 10 MG tablet Take 10 mg by mouth at bedtime.   05/19/2022   cholecalciferol (VITAMIN D) 25 MCG tablet Take 2 tablets (2,000 Units total) by mouth 2 (two) times daily.   05/19/2022   diltiazem (CARDIZEM CD) 240 MG 24 hr capsule Take 240 mg by mouth every evening.   05/19/2022   ELIQUIS 5 MG TABS tablet TAKE 1 TABLET BY MOUTH TWICE A DAY (Patient taking differently: Take 5 mg by mouth 2 (two) times daily.) 60 tablet 5 05/19/2022 at 2100    escitalopram (LEXAPRO) 10 MG tablet TAKE 1 TABLET BY MOUTH EVERY DAY (Patient taking differently: Take 10 mg by mouth daily.) 90 tablet 1 05/19/2022   fentaNYL 37.5 MCG/HR PT72 Place 1 patch onto the skin every 3 (three) days. 10 patch 0 05/19/2022   ferrous sulfate 325 (65 FE) MG tablet Take 1 tablet (325 mg total) by mouth 2 (two) times daily with a meal. 60 tablet 3 05/19/2022   furosemide (LASIX) 40 MG tablet Take 40 mg by mouth daily.   05/19/2022   furosemide (LASIX) 40 MG tablet Take 1 tablet (40 mg total) by mouth daily as needed (extra dose, for weight gain of 2 pounds overnight or 5 pound in 1 week).   unknown   HYDROcodone-acetaminophen (NORCO) 5-325 MG tablet Take 1 tablet by mouth every 12 (twelve) hours as needed for severe pain. 10 tablet 0 05/20/2022   ipratropium-albuterol (DUONEB) 0.5-2.5 (3) MG/3ML SOLN Take 3 mLs by nebulization every 4 (four) hours as needed. (Patient taking differently: Take 3 mLs by nebulization every 4 (four) hours as needed (wheezing/shortness of breath).) 360 mL  unknown   meloxicam (MOBIC) 7.5 MG tablet Take 7.5 mg by mouth daily.   05/19/2022   metoprolol tartrate (LOPRESSOR) 100 MG tablet Take 100 mg by mouth 2 (two) times daily.   05/19/2022 at 1700   Multiple Vitamin (MULTIVITAMIN WITH MINERALS) TABS tablet Take 1 tablet by mouth daily. 60 tablet 0 05/19/2022   nitroGLYCERIN (NITROSTAT) 0.4 MG SL tablet Place 1 tablet (0.4 mg total) under the tongue every 5 (five) minutes as needed for chest pain. 25 tablet 3 05/20/2022   Nutritional Supplement LIQD Take 120 mLs by mouth daily. House stock supplement   05/19/2022   ondansetron (ZOFRAN) 4 MG tablet Take 4 mg by mouth every 8 (eight) hours as needed for nausea or vomiting.   05/20/2022   OXYGEN Inhale 2 L/min into the lungs as needed (maintain SATS above 90%).   05/19/2022   pantoprazole (PROTONIX) 40  MG tablet Take 1 tablet (40 mg total) by mouth 2 (two) times daily.   05/19/2022   polyethylene glycol (MIRALAX) 17 g  packet Take 17 g by mouth daily. (Patient taking differently: Take 17 g by mouth daily as needed for mild constipation.) 28 each 2 unknown   potassium chloride (KLOR-CON M) 10 MEQ tablet Take 3 tablets (30 mEq total) by mouth daily.   05/19/2022   Pramox-PE-Glycerin-Petrolatum (HEMORRHOIDAL MAX STR) 1-0.25-14.4-15 % CREA Apply 1 Application topically 2 (two) times daily as needed (hemorrhoidal pain/discomfort.). 25.5 g 0 unknown   predniSONE (DELTASONE) 10 MG tablet Take 1 tablet (10 mg total) by mouth daily with breakfast. Resume after 3 days of '20mg'$  daily as instructed. 30 tablet 0 05/17/2022   QUEtiapine (SEROQUEL) 25 MG tablet Take 1 tablet (25 mg total) by mouth at bedtime. 30 tablet 0 05/19/2022   sulfaSALAzine (AZULFIDINE) 500 MG EC tablet Take 500 mg by mouth daily.   05/19/2022   Scheduled:   Chlorhexidine Gluconate Cloth  6 each Topical Daily   fentaNYL  1 patch Transdermal Q72H   methylPREDNISolone (SOLU-MEDROL) injection  40 mg Intravenous Q12H   naloxone       pantoprazole (PROTONIX) IV  40 mg Intravenous Q12H   sodium phosphate  1 enema Rectal Once   [START ON 05/24/2022] thiamine (VITAMIN B1) injection  100 mg Intravenous Daily   Infusions:   azithromycin 500 mg (05/21/22 2220)   cefTRIAXone (ROCEPHIN)  IV 2 g (05/21/22 1656)   dextrose 5 % and 0.45% NaCl 125 mL/hr at 05/22/22 0328   heparin 650 Units/hr (05/22/22 0140)   thiamine (VITAMIN B1) injection Stopped (05/21/22 1109)    Assessment: Pt with a hx of AF on apixaban presenting with PNA. Heparin ordered for bridging. Her last apixaban dose was at 2100 on 2/20. We will use PTT for now until correlation.   Initial aPTT >200; repeated so will assume supra-therapeutic is real. Heparin level elevated, as expected, due to influence of apixaban.   Repeat aPTT this morning is 82 (therapeutic), HL is still high showing lack of correlation due to apixaban effect.  Hgb 12.2>>9.9; plt 183,patient with acute blood loss anemia and  positive FOBT with black tarry stools this AM, Heparin drip paused by GI service.    Goal of Therapy:  Heparin level 0.3-0.7 units/ml aPTT 66-102 seconds Monitor platelets by anticoagulation protocol: Yes   Plan:  Continue to hold heparin for GI bleeding Please advise pharmacy as to when to restart  Kayla Weekes A. Levada Dy, PharmD, BCPS, FNKF Clinical Pharmacist [place] Please utilize Amion for appropriate phone number to reach the unit pharmacist ([place])

## 2022-05-22 NOTE — H&P (View-Only) (Signed)
Consultation  Referring Provider:  Dr. Florene Glen    Primary Care Physician:  Alvester Morin, MD Primary Gastroenterologist:    Dr. Loletha Carrow     Reason for Consultation: Anemia            HPI:   Betty Jackson is a 88 y.o. female with a past medical history significant for chronic pain on chronic opiates, A-fib on Eliquis, RA and temporal arteritis on chronic steroids and others as listed below who initially presented to the ER on 05/20/2022 with "confusion".  We are consulted now in regards to anemia.    At time of arrival patient came from a skilled level of care in a facility, wheelchair-bound.  Daughter had noticed 4 to 5 days of more confusion and lethargy.  She was noted to have hypoxia and altered mental status with her last normal on 2/20.  Patient admitted for pneumonia and hypercarbic respiratory failure.    Per notes from this morning around 5:47 AM patient was wearing depends and noted to have tarry dark stools.  At that time labs ordered showing hemoglobin dropped from 12.2-9.9-9.8 and a positive FOBT.  Patient noted to have a large dark bowel movement with some blood noted burgundy in color at 806 this morning.  Heparin drip is currently on hold.    Today, patient is seen with family by the bedside.  They describe that patient had some bleeding overnight and then has had another bloody bowel movement since.  Patient denies any new abdominal pain or GI concerns.  Family reminds me of her history of esophagitis in the past.  She has been on her medications per their knowledge.  They would like to proceed with EGD for further evaluation.    Denies abdominal pain, nausea, vomiting or blood thinners currently.  GI history: 09/10/2021 colonoscopy done for hematochezia with hemorrhoids, external and internal as well as a single solitary ulcer in the distal rectum and two 6-8 mm polyps in the descending and transverse colon as well as diverticulosis in the sigmoid and descending  colon 09/10/2021 EGD normal esophagus (prior severe esophagitis resolved), erythematous mucosa in the antrum, otherwise normal 08/05/2021 EGD severe esophagitis with bleeding and gastritis  Past Medical History:  Diagnosis Date   Acute respiratory failure with hypoxia ([place]) 05/20/2017   Anemia, unspecified 10/28/2012   Anxiety state, unspecified 10/28/2012   CAD (coronary artery disease)    a. Stent RCA in [facility];  b. Cath approx 2009 - nonobs per pt report.   Cataract    immature on the left eye   Chronic insomnia 02/07/2013   Chronic lower back pain    scoliosis   CKD (chronic kidney disease) stage 3, GFR 30-59 ml/min (HCC)    CVA (cerebral infarction) 10/29/2012   DDD (degenerative disc disease)    Diverticulosis    GERD (gastroesophageal reflux disease) 09/01/2010   Hemorrhoids    Herniated nucleus pulposus, L5-S1, right 11/04/2015   Hyperlipidemia    takes Lipitor daily   Hypertension    takes Amlodipine,Losartan,Metoprolol,and HCTZ daily   Incontinence of urine    Insomnia    takes Restoril nightly   Lumbar stenosis 04/25/2013   Major depressive disorder, recurrent episode, moderate ([place]) 07/16/2013   Osteoporosis    PAF (paroxysmal atrial fibrillation) ([place]) 2011   a. lone epidode in 2011 according to notes.   Rheumatoid arthritis ([place])    Scoliosis    Sepsis ([place]) 05/2019   Stroke ([place])  Temporal arteritis ([place]) 2011   a. followed @ [facility]; potential flareup 10/28/2012/notes 10/28/2012   Vocal cord dysfunction    "they don't operate properly" (10/28/2012)    Past Surgical History:  Procedure Laterality Date   ABDOMINAL HYSTERECTOMY  ~ 1984   vaginally   BACK SURGERY  7-31yr ago   X Stop   BLADDER SUSPENSION  2001   BREAST BIOPSY Right    CATARACT EXTRACTION W/ INTRAOCULAR LENS IMPLANT Right ~ 08/2012   CHEST TUBE INSERTION Left 03/08/2019   Procedure: Chest Tube Insertion;  Surgeon: VIvin Poot MD;  [place]  Service: Thoracic;  Laterality:  Left;   COLONOSCOPY  01/26/2012   Procedure: COLONOSCOPY;  Surgeon: MLadene Artist MD,FACG;  [facility]  Service: Endoscopy;  Laterality: N/A;  note the EGD is possible   COLONOSCOPY WITH PROPOFOL N/A 09/10/2021   Procedure: COLONOSCOPY WITH PROPOFOL;  Surgeon: GGatha Mayer MD;  [place]  Service: Gastroenterology;  Laterality: N/A;   CORONARY ANGIOPLASTY WITH STENT PLACEMENT  2006   X 1 stent   EPIDURAL BLOCK INJECTION     ESOPHAGOGASTRODUODENOSCOPY  01/26/2012   Procedure: ESOPHAGOGASTRODUODENOSCOPY (EGD);  Surgeon: MLadene Artist MD,FACG;  [facility];  Service: Endoscopy;  Laterality: N/A;   ESOPHAGOGASTRODUODENOSCOPY (EGD) WITH PROPOFOL N/A 08/05/2021   Procedure: ESOPHAGOGASTRODUODENOSCOPY (EGD) WITH PROPOFOL;  Surgeon: DDoran Stabler MD;  [place]  Service: Gastroenterology;  Laterality: N/A;   ESOPHAGOGASTRODUODENOSCOPY (EGD) WITH PROPOFOL N/A 09/10/2021   Procedure: ESOPHAGOGASTRODUODENOSCOPY (EGD) WITH PROPOFOL;  Surgeon: GGatha Mayer MD;  [place]  Service: Gastroenterology;  Laterality: N/A;   FINE NEEDLE ASPIRATION Right 09/26/2019   Procedure: FINE NEEDLE ASPIRATION;  Surgeon: HAltamese Manistique MD;  [place]  Service: Orthopedics;  Laterality: Right;   FOREIGN BODY REMOVAL  08/05/2021   Procedure: FOREIGN BODY REMOVAL;  Surgeon: DDoran Stabler MD;  [facility];  Service: Gastroenterology;;   HEMIARTHROPLASTY HIP Right 2012   IR EPIDUROGRAPHY  04/20/2019   LAPAROSCOPIC CHOLECYSTECTOMY  2001   LUMBAR FUSION  03/2013   LUMBAR LAMINECTOMY/DECOMPRESSION MICRODISCECTOMY Right 11/04/2015   Procedure: Right Lumbar Five-Sacral One Microdiskectomy;  Surgeon: HKristeen Miss MD;  [facility];  Service: Neurosurgery;  Laterality: Right;  Right L5-S1 Microdiskectomy   moles removed that required stiches     one on leg and one on face   ORIF FEMUR FRACTURE Right 09/26/2019   Procedure:  OPEN REDUCTION INTERNAL FIXATION (ORIF) DISTAL FEMUR FRACTURE;  Surgeon: HAltamese Negley MD;  [place]  Service: Orthopedics;  Laterality: Right;   ORIF FEMUR FRACTURE Right 10/17/2019   Procedure: OPEN REDUCTION INTERNAL FIXATION (ORIF) DISTAL FEMUR FRACTURE;  Surgeon: HAltamese [place]reek MD;  [place]  Service: Orthopedics;  Laterality: Right;   POLYPECTOMY  09/10/2021   Procedure: POLYPECTOMY;  Surgeon: GGatha Mayer MD;  [place]  Service: Gastroenterology;;   SUBXYPHOID PERICARDIAL WINDOW N/A 03/08/2019   Procedure: SUBXYPHOID PERICARDIAL WINDOW;  Surgeon: VIvin Poot MD;  [place]  Service: Thoracic;  Laterality: N/A;   TEE WITHOUT CARDIOVERSION N/A 03/08/2019   Procedure: TRANSESOPHAGEAL ECHOCARDIOGRAM (TEE);  Surgeon: VPrescott Gum PCollier Salina MD;  [place]  Service: Thoracic;  Laterality: N/A;   TEMPORAL ARTERY BIOPSY / LIGATION Bilateral 2011   TONSILLECTOMY AND ADENOIDECTOMY     at age 88  [place] 2010   "lower back" (10/28/2012)  Family History  Problem Relation Age of Onset   Brain cancer Mother 64   Heart attack Father    Breast cancer Daughter 43   Heart disease Other    Hypertension Other    Arthritis Neg Hx    Colon cancer Neg Hx    Osteoporosis Neg Hx     Social History   Tobacco Use   Smoking status: Never   Smokeless tobacco: Never  Vaping Use   Vaping Use: Never used  Substance Use Topics   Alcohol use: Yes    Comment: socially   Drug use: No    Prior to Admission medications   Medication Sig Start Date End Date Taking? Authorizing Provider  acetaminophen (TYLENOL) 500 MG tablet Take 500 mg by mouth every 6 (six) hours as needed for mild pain.   Yes [provider]  allopurinol (ZYLOPRIM) 100 MG tablet Take 100 mg by mouth daily.   Yes [provider]  amiodarone (PACERONE) 200 MG tablet Take '200mg'$  twice daily for 5 days, then take '200mg'$  once  daily. Patient taking differently: Take 200 mg by mouth daily. 08/11/21  Yes Bonnielee Haff, MD  atorvastatin (LIPITOR) 80 MG tablet Take 1 tablet (80 mg total) by mouth daily. Patient taking differently: Take 80 mg by mouth at bedtime. 09/25/20  Yes Garvin Fila, MD  cetirizine (ZYRTEC) 10 MG tablet Take 10 mg by mouth at bedtime.   Yes [provider]  cholecalciferol (VITAMIN D) 25 MCG tablet Take 2 tablets (2,000 Units total) by mouth 2 (two) times daily. 09/29/19  Yes Ainsley Spinner, PA-C  diltiazem (CARDIZEM CD) 240 MG 24 hr capsule Take 240 mg by mouth every evening. 10/10/21  Yes [provider]  ELIQUIS 5 MG TABS tablet TAKE 1 TABLET BY MOUTH TWICE A DAY Patient taking differently: Take 5 mg by mouth 2 (two) times daily. 05/09/20  Yes Burnell Blanks, MD  escitalopram (LEXAPRO) 10 MG tablet TAKE 1 TABLET BY MOUTH EVERY DAY Patient taking differently: Take 10 mg by mouth daily. 08/27/20  Yes Burchette, Alinda Sierras, MD  fentaNYL 37.5 MCG/HR PT72 Place 1 patch onto the skin every 3 (three) days. 08/10/21  Yes Bonnielee Haff, MD  ferrous sulfate 325 (65 FE) MG tablet Take 1 tablet (325 mg total) by mouth 2 (two) times daily with a meal. 04/21/19  Yes Darrick Meigs, Marge Duncans, MD  furosemide (LASIX) 40 MG tablet Take 40 mg by mouth daily.   Yes [provider]  furosemide (LASIX) 40 MG tablet Take 1 tablet (40 mg total) by mouth daily as needed (extra dose, for weight gain of 2 pounds overnight or 5 pound in 1 week). 09/11/21  Yes Barton Dubois, MD  HYDROcodone-acetaminophen (NORCO) 5-325 MG tablet Take 1 tablet by mouth every 12 (twelve) hours as needed for severe pain. 09/11/21  Yes Barton Dubois, MD  ipratropium-albuterol (DUONEB) 0.5-2.5 (3) MG/3ML SOLN Take 3 mLs by nebulization every 4 (four) hours as needed. Patient taking differently: Take 3 mLs by nebulization every 4 (four) hours as needed (wheezing/shortness of breath). 08/10/21  Yes Bonnielee Haff, MD  meloxicam (MOBIC)  7.5 MG tablet Take 7.5 mg by mouth daily.   Yes [provider]  metoprolol tartrate (LOPRESSOR) 100 MG tablet Take 100 mg by mouth 2 (two) times daily.   Yes [provider]  Multiple Vitamin (MULTIVITAMIN WITH MINERALS) TABS tablet Take 1 tablet by mouth daily. 10/21/19  Yes Ainsley Spinner, PA-C  nitroGLYCERIN (NITROSTAT) 0.4 MG SL  tablet Place 1 tablet (0.4 mg total) under the tongue every 5 (five) minutes as needed for chest pain. 02/25/22  Yes Burnell Blanks, MD  Nutritional Supplement LIQD Take 120 mLs by mouth daily. House stock supplement   Yes [provider]  ondansetron (ZOFRAN) 4 MG tablet Take 4 mg by mouth every 8 (eight) hours as needed for nausea or vomiting.   Yes [provider]  OXYGEN Inhale 2 L/min into the lungs as needed (maintain SATS above 90%).   Yes [provider]  pantoprazole (PROTONIX) 40 MG tablet Take 1 tablet (40 mg total) by mouth 2 (two) times daily. 08/11/21  Yes Bonnielee Haff, MD  polyethylene glycol (MIRALAX) 17 g packet Take 17 g by mouth daily. Patient taking differently: Take 17 g by mouth daily as needed for mild constipation. 09/11/21  Yes Barton Dubois, MD  potassium chloride (KLOR-CON M) 10 MEQ tablet Take 3 tablets (30 mEq total) by mouth daily. 09/11/21  Yes Barton Dubois, MD  Pramox-PE-Glycerin-Petrolatum (HEMORRHOIDAL MAX STR) 1-0.25-14.4-15 % CREA Apply 1 Application topically 2 (two) times daily as needed (hemorrhoidal pain/discomfort.). 09/11/21  Yes Barton Dubois, MD  predniSONE (DELTASONE) 10 MG tablet Take 1 tablet (10 mg total) by mouth daily with breakfast. Resume after 3 days of '20mg'$  daily as instructed. 08/15/21  Yes Bonnielee Haff, MD  QUEtiapine (SEROQUEL) 25 MG tablet Take 1 tablet (25 mg total) by mouth at bedtime. 03/16/21 05/20/22 Yes Pahwani, Einar Grad, MD  sulfaSALAzine (AZULFIDINE) 500 MG EC tablet Take 500 mg by mouth daily.   Yes [provider]    Current  Facility-Administered Medications  Medication Dose Route Frequency Provider Last Rate Last Admin   acetaminophen (TYLENOL) tablet 650 mg  650 mg Oral Q6H PRN Elodia Florence., MD       Or   acetaminophen (TYLENOL) suppository 650 mg  650 mg Rectal Q6H PRN Elodia Florence., MD   650 mg at 05/20/22 2240   albuterol (PROVENTIL) (2.5 MG/3ML) 0.083% nebulizer solution 2.5 mg  2.5 mg Nebulization Q4H PRN Elodia Florence., MD       azithromycin [facility]) 500 mg in sodium chloride 0.9 % 250 mL IVPB  500 mg Intravenous Q24H Elodia Florence., MD 250 mL/hr at 05/21/22 2220 500 mg at 05/21/22 2220   cefTRIAXone (ROCEPHIN) 2 g in sodium chloride 0.9 % 100 mL IVPB  2 g Intravenous Q24H Elodia Florence., MD 200 mL/hr at 05/21/22 1656 2 g at 05/21/22 1656   Chlorhexidine Gluconate Cloth 2 % PADS 6 each  6 each Topical Daily Elodia Florence., MD   6 each at 05/21/22 2213   dextrose 5 %-0.45 % sodium chloride infusion   Intravenous Continuous Elodia Florence., MD 125 mL/hr at 05/22/22 0328 New Bag at 05/22/22 0328   fentaNYL (DURAGESIC) 12 MCG/HR 1 patch  1 patch Transdermal Q72H Irene Pap N, DO   1 patch at 05/21/22 0520   fentaNYL (SUBLIMAZE) injection 12.5 mcg  12.5 mcg Intravenous Q2H PRN Kayleen Memos, DO   12.5 mcg at 05/21/22 1655   hydrocortisone (ANUSOL-HC) 2.5 % rectal cream 1 Application  1 Application Rectal BID PRN Elodia Florence., MD       methylPREDNISolone sodium succinate (SOLU-MEDROL) 40 mg/mL injection 40 mg  40 mg Intravenous Q12H Elodia Florence., MD   40 mg at 05/21/22 2217   naloxone [facility]) 0.4 MG/ML injection            [  START ON 05/25/2022] pantoprazole (PROTONIX) injection 40 mg  40 mg Intravenous Q12H Elodia Florence., MD       pantoprazole (PROTONIX) injection 40 mg  40 mg Intravenous Once Elodia Florence., MD       pantoprozole (PROTONIX) 80 mg /NS 100 mL infusion  8 mg/hr Intravenous Continuous Elodia Florence., MD       sodium phosphate (FLEET) 7-19 GM/118ML enema 1 enema  1 enema Rectal Once Elodia Florence., MD       thiamine (VITAMIN B1) 500 mg in sodium chloride 0.9 % 50 mL IVPB  500 mg Intravenous Daily Elodia Florence., MD   Stopped at 05/21/22 1109   Followed by   Derrill Memo ON 05/24/2022] thiamine (VITAMIN B1) injection 100 mg  100 mg Intravenous Daily Elodia Florence., MD        Allergies as of 05/20/2022 - Review Complete 05/20/2022  Allergen Reaction Noted   Alendronate sodium  09/05/2021   Dextromethorphan Rash 06/24/2013     Review of Systems:    Constitutional: No weight loss, fever or chills Skin: No rash  Cardiovascular: No chest pain Respiratory: +SOB Gastrointestinal: See HPI and otherwise negative Genitourinary: No dysuria  Neurological: No headache, dizziness or syncope Musculoskeletal: No new muscle or joint pain Hematologic: No bruising Psychiatric: No history of depression or anxiety    Physical Exam:  Vital signs in last 24 hours: Temp:  [98.1 F (36.7 C)-99.3 F (37.4 C)] 98.2 F (36.8 C) (02/23 0253) Pulse Rate:  [73-86] 73 (02/23 0253) Resp:  [13-16] 14 (02/23 0253) BP: (95-146)/(48-66) 110/48 (02/23 0253) SpO2:  [87 %-98 %] 97 % (02/23 0253) Weight:  [82.3 kg] 82.3 kg (02/23 0426) Last BM Date : 05/21/22 General:   Pleasant pale appearing Elderly Caucasian female appears to be in NAD, Well developed, Well nourished, alert and cooperative Head:  Normocephalic and atraumatic. Eyes:   PEERL, EOMI. No icterus. Conjunctiva pink. Ears:  Normal auditory acuity. Neck:  Supple Throat: Oral cavity and pharynx without inflammation, swelling or lesion.  Lungs: Respirations even and unlabored. Lungs clear to auscultation bilaterally.   No wheezes, crackles, or rhonchi. +on O2 via [place] Heart: Normal S1, S2. No MRG. Regular rate and rhythm. No peripheral edema, cyanosis or pallor.  Abdomen:  Soft, nondistended, nontender. No rebound or guarding.  Normal bowel sounds. No appreciable masses or hepatomegaly. Rectal:  Not performed.  Msk:  Symmetrical without gross deformities. Peripheral pulses intact.  Extremities:  Without edema, no deformity or joint abnormality.  Neurologic:  Alert and  oriented x4;  grossly normal neurologically.  Skin:   Dry and intact without significant lesions or rashes. Psychiatric: Demonstrates good judgement and reason without abnormal affect or behaviors.   LAB RESULTS: Recent Labs    05/21/22 0600 05/21/22 0609 05/21/22 2116 05/22/22 0625  WBC 28.5*  --  25.1* 26.8*  HGB 11.8* 12.2 9.9* 9.8*  HCT 39.1 36.0 32.9* 31.9*  PLT 189  --  183 185   BMET Recent Labs    05/21/22 0600 05/21/22 0609 05/21/22 1519 05/22/22 0625  NA 149* 150* 146* 142  K 3.8 3.8 4.6 4.1  CL 112*  --  111 111  CO2 22  --  25 25  GLUCOSE 173*  --  281* 174*  BUN 31*  --  38* 42*  CREATININE 2.30*  --  2.58* 2.43*  CALCIUM 8.6*  --  7.9* 7.4*   LFT Recent Labs  05/22/22 0625  PROT 5.1*  ALBUMIN 2.4*  AST 29  ALT 16  ALKPHOS 74  BILITOT 0.5    STUDIES: DG CHEST PORT 1 VIEW  Result Date: 05/21/2022 CLINICAL DATA:  88 year old female with chest pain, hypoxia, vomiting. EXAM: PORTABLE CHEST 1 VIEW COMPARISON:  CT Chest, Abdomen, and Pelvis yesterday, and earlier. FINDINGS: Portable AP semi upright view at 0400 hours. Continued low lung volumes. Stable mediastinal contours. Ongoing curvilinear opacity at the lung bases left greater than right, not significantly changed from the chest CT yesterday. No pneumothorax, pulmonary edema, or pleural effusion identified. Visualized tracheal air column is within normal limits. Paucity of bowel gas in the visible abdomen. Stable cholecystectomy clips. Stable visualized osseous structures. IMPRESSION: 1. Continued low lung volumes and bibasilar lung opacity, stable from the chest CT yesterday. Appearance favors atelectasis, but cannot exclude lung base infection. 2. No new  cardiopulmonary abnormality identified. Electronically Signed   By: Genevie Ann M.D.   On: 05/21/2022 04:07   CT CHEST ABDOMEN PELVIS WO CONTRAST  Result Date: 05/20/2022 CLINICAL DATA:  Abdominal pain, acute, nonlocalized.  Chest pain. EXAM: CT CHEST, ABDOMEN AND PELVIS WITHOUT CONTRAST TECHNIQUE: Multidetector CT imaging of the chest, abdomen and pelvis was performed following the standard protocol without IV contrast. RADIATION DOSE REDUCTION: This exam was performed according to the departmental dose-optimization program which includes automated exposure control, adjustment of the mA and/or kV according to patient size and/or use of iterative reconstruction technique. COMPARISON:  Chest CT 09/04/2021.  CT abdomen 07/17/2020. FINDINGS: CT CHEST FINDINGS Cardiovascular: Mild cardiomegaly. Coronary artery calcification and aortic atherosclerotic calcification. Mediastinum/Nodes: No mediastinal or hilar mass or lymphadenopathy. Lungs/Pleura: No pleural effusion. Patchy density at both lung bases left more than right could be scarring or bibasilar pneumonia. Lung apices show benign pleural and parenchymal scarring. Musculoskeletal: Old augmented compression fracture at T11 without further collapse. Chronic superior endplate fracture at T7 with loss of height of 30%. No progressive collapse. No new fracture. CT ABDOMEN PELVIS FINDINGS Hepatobiliary: No liver lesion seen on this study done without contrast. Previous cholecystectomy. Mild chronic ductal prominence. Pancreas: Motion degraded.  No abnormality seen. Spleen: Negative Adrenals/Urinary Tract: Adrenal glands are normal. No acute renal finding. No hydronephrosis. Bladder appears unremarkable. Stomach/Bowel: Stomach and small intestine are unremarkable. Normal appendix. Diverticulosis of the colon without visible diverticulitis. Rectal fecal impaction. Vascular/Lymphatic: Aortic atherosclerosis. No aneurysm. IVC is normal. No adenopathy. Reproductive: Previous  hysterectomy.  No pelvic mass. Other: No free fluid or air. Musculoskeletal: No acute lumbar spine or pelvic abnormality. Old vertebral augmentation at L1. Previous decompression and fusion L2 through L4. IMPRESSION: 1. Patchy density at both lung bases left more than right could be scarring or bibasilar pneumonia. 2. Cardiomegaly. Coronary artery calcification. Aortic atherosclerosis. 3. No acute or traumatic finding in the abdomen or pelvis. 4. Rectal fecal impaction. 5. Diverticulosis without visible diverticulitis. 6. Old augmented compression fracture at T11 without further collapse. Chronic superior endplate fracture at T7 with loss of height of 30%. No progressive collapse. No new fracture. Old vertebral augmentation at L1. Previous decompression and fusion L2 through L4. Aortic Atherosclerosis (ICD10-I70.0). Electronically Signed   By: Nelson Chimes M.D.   On: 05/20/2022 16:10   CT Head Wo Contrast  Result Date: 05/20/2022 CLINICAL DATA:  Altered mental status EXAM: CT HEAD WITHOUT CONTRAST TECHNIQUE: Contiguous axial images were obtained from the base of the skull through the vertex without intravenous contrast. RADIATION DOSE REDUCTION: This exam was performed according to the departmental  dose-optimization program which includes automated exposure control, adjustment of the mA and/or kV according to patient size and/or use of iterative reconstruction technique. COMPARISON:  04/30/21 CT head FINDINGS: Brain: No evidence of acute infarction, hemorrhage, hydrocephalus, extra-axial collection or mass lesion/mass effect. Sequela of severe chronic microvascular ischemic change, unchanged from prior exam. Vascular: No hyperdense vessel. Redemonstrated calcified atherosclerotic plaque in the proximal left M1. Skull: Normal. Negative for fracture or focal lesion. Sinuses/Orbits: Bilateral lens replacement. Trace left mastoid effusion. No middle ear effusion. Paranasal sinuses are clear. Other: There is a pannus  at the craniocervical junction. IMPRESSION: No acute intracranial abnormality. Electronically Signed   By: Marin Roberts M.D.   On: 05/20/2022 16:04   DG Chest Port 1 View  Result Date: 05/20/2022 CLINICAL DATA:  Chest pain, emesis and shortness of breath. EXAM: PORTABLE CHEST 1 VIEW COMPARISON:  09/03/2021; 08/06/2021; chest CT-09/04/2021 FINDINGS: Grossly unchanged cardiac silhouette and mediastinal contours. Grossly unchanged bibasilar heterogeneous opacities, left-greater-than-right. No new focal airspace opacities. No pleural effusion or pneumothorax. No evidence of edema. No acute osseous abnormalities. Degenerative change of the bilateral glenohumeral joints is suspected though incompletely evaluated. Postcholecystectomy. IMPRESSION: Similar findings of bibasilar atelectasis without superimposed acute cardiopulmonary disease on this AP portable examination. Electronically Signed   By: Sandi Mariscal M.D.   On: 05/20/2022 13:14      Impression / Plan:   Impression: 1.  Anemia: With hemoglobin dropped from 12.2-9.8 overnight, 2 large episodes of melena/dark bloody bowel movements, the last around 8:00 this morning, Heparin has been stopped as of 6:00 this morning which is likely exacerbating this; consider esophagitis which she has a history of versus gastritis versus PUD 2.  Acute hypercarbic and hypoxic respiratory failure 3.  Community-acquired pneumonia 4.  Acute metabolic encephalopathy 5.  AKI 6.  Nausea/vomiting and abdominal discomfort 7.  A-fib: Typically on Eliquis switched to Heparin at time of admission on 2/20-now on hold as of 6:00 this morning 8.  Fecal impaction: At time of admission, enema ordered by EDP 9.  Rheumatoid arthritis: Sulfasalazine on hold 10.  Chronic pain: Typically on Fentanyl  Plan: 1.  Patient did drink half of the Sprite at 9:00 this morning, she would be okay for procedures by 1:00 today, Heparin has been on hold since 6:00 this morning. 2.  Currently our  endoscopy schedule is full for today, but we are working on trying to move some things around to get this patient's EGD done today given her bleeding.  I am going to continue her n.p.o. for now, but we will let her have clears if we are unable to schedule her procedure. 3.  Did discuss an EGD including risks, benefits, limitations and alternatives and the patient and family agreed to proceed. 4.  Continue to monitor hemoglobin every 4 hours with transfusion as needed less than 7 5.  Okay to stay on Pantoprazole infusion at the moment  Thank you for your kind consultation, we will continue to follow.  Lavone Nian [facility]  05/22/2022, 8:19 AM

## 2022-05-23 ENCOUNTER — Inpatient Hospital Stay (HOSPITAL_COMMUNITY): Payer: 59 | Admitting: Anesthesiology

## 2022-05-23 ENCOUNTER — Encounter (HOSPITAL_COMMUNITY): Payer: Self-pay | Admitting: Family Medicine

## 2022-05-23 ENCOUNTER — Encounter (HOSPITAL_COMMUNITY)
Admission: EM | Disposition: A | Discharge status: Skilled Nursing Facility | Payer: Self-pay | Source: Home / Self Care | Attending: Family Medicine

## 2022-05-23 DIAGNOSIS — R112 Nausea with vomiting, unspecified: Secondary | ICD-10-CM | POA: Diagnosis not present

## 2022-05-23 DIAGNOSIS — K2970 Gastritis, unspecified, without bleeding: Secondary | ICD-10-CM

## 2022-05-23 DIAGNOSIS — R0902 Hypoxemia: Secondary | ICD-10-CM | POA: Diagnosis not present

## 2022-05-23 DIAGNOSIS — J189 Pneumonia, unspecified organism: Secondary | ICD-10-CM | POA: Diagnosis not present

## 2022-05-23 DIAGNOSIS — I2510 Atherosclerotic heart disease of native coronary artery without angina pectoris: Secondary | ICD-10-CM | POA: Diagnosis not present

## 2022-05-23 DIAGNOSIS — I110 Hypertensive heart disease with heart failure: Secondary | ICD-10-CM

## 2022-05-23 DIAGNOSIS — K449 Diaphragmatic hernia without obstruction or gangrene: Secondary | ICD-10-CM

## 2022-05-23 DIAGNOSIS — I509 Heart failure, unspecified: Secondary | ICD-10-CM

## 2022-05-23 DIAGNOSIS — N179 Acute kidney failure, unspecified: Secondary | ICD-10-CM | POA: Diagnosis not present

## 2022-05-23 DIAGNOSIS — K2990 Gastroduodenitis, unspecified, without bleeding: Secondary | ICD-10-CM | POA: Diagnosis not present

## 2022-05-23 HISTORY — PX: ESOPHAGOGASTRODUODENOSCOPY (EGD) WITH PROPOFOL: SHX5813

## 2022-05-23 HISTORY — PX: BIOPSY: SHX5522

## 2022-05-23 LAB — CBC
HCT: 27.4 % — ABNORMAL LOW (ref 36.0–46.0)
Hemoglobin: 8.4 g/dL — ABNORMAL LOW (ref 12.0–15.0)
MCH: 31 pg (ref 26.0–34.0)
MCHC: 30.7 g/dL (ref 30.0–36.0)
MCV: 101.1 fL — ABNORMAL HIGH (ref 80.0–100.0)
Platelets: 161 10*3/uL (ref 150–400)
RBC: 2.71 MIL/uL — ABNORMAL LOW (ref 3.87–5.11)
RDW: 14.4 % (ref 11.5–15.5)
WBC: 19.2 10*3/uL — ABNORMAL HIGH (ref 4.0–10.5)
nRBC: 0.2 % (ref 0.0–0.2)

## 2022-05-23 LAB — COMPREHENSIVE METABOLIC PANEL
ALT: 15 U/L (ref 0–44)
AST: 26 U/L (ref 15–41)
Albumin: 2.4 g/dL — ABNORMAL LOW (ref 3.5–5.0)
Alkaline Phosphatase: 66 U/L (ref 38–126)
Anion gap: 9 (ref 5–15)
BUN: 45 mg/dL — ABNORMAL HIGH (ref 8–23)
CO2: 21 mmol/L — ABNORMAL LOW (ref 22–32)
Calcium: 7.2 mg/dL — ABNORMAL LOW (ref 8.9–10.3)
Chloride: 114 mmol/L — ABNORMAL HIGH (ref 98–111)
Creatinine, Ser: 2.24 mg/dL — ABNORMAL HIGH (ref 0.44–1.00)
GFR, Estimated: 21 mL/min — ABNORMAL LOW (ref >60)
Glucose, Bld: 107 mg/dL — ABNORMAL HIGH (ref 70–99)
Potassium: 3.9 mmol/L (ref 3.5–5.1)
Sodium: 144 mmol/L (ref 135–145)
Total Bilirubin: 0.4 mg/dL (ref 0.3–1.2)
Total Protein: 4.7 g/dL — ABNORMAL LOW (ref 6.5–8.1)

## 2022-05-23 LAB — HEMOGLOBIN AND HEMATOCRIT, BLOOD
HCT: 29.9 % — ABNORMAL LOW (ref 36.0–46.0)
Hemoglobin: 9.2 g/dL — ABNORMAL LOW (ref 12.0–15.0)

## 2022-05-23 LAB — PHOSPHORUS: Phosphorus: 2.9 mg/dL (ref 2.5–4.6)

## 2022-05-23 LAB — HEPARIN LEVEL (UNFRACTIONATED): Heparin Unfractionated: >1.1 IU/mL — ABNORMAL HIGH (ref 0.30–0.70)

## 2022-05-23 LAB — MAGNESIUM: Magnesium: 1.5 mg/dL — ABNORMAL LOW (ref 1.7–2.4)

## 2022-05-23 LAB — HEMOGLOBIN: Hemoglobin: 8.5 g/dL — ABNORMAL LOW (ref 12.0–15.0)

## 2022-05-23 SURGERY — ESOPHAGOGASTRODUODENOSCOPY (EGD) WITH PROPOFOL
Anesthesia: Monitor Anesthesia Care

## 2022-05-23 MED ORDER — QUETIAPINE FUMARATE 25 MG PO TABS
25.0000 mg | ORAL_TABLET | Freq: Every day | ORAL | Status: DC
  Administered 2022-05-23 – 2022-05-27 (×5): 25 mg via ORAL
  Filled 2022-05-23 (×5): qty 1

## 2022-05-23 MED ORDER — LIDOCAINE 2% (20 MG/ML) 5 ML SYRINGE
INTRAMUSCULAR | Status: DC | PRN
  Administered 2022-05-23: 20 mg via INTRAVENOUS
  Administered 2022-05-23: 40 mg via INTRAVENOUS

## 2022-05-23 MED ORDER — FENTANYL CITRATE PF 50 MCG/ML IJ SOSY
12.5000 ug | PREFILLED_SYRINGE | INTRAMUSCULAR | Status: DC | PRN
  Administered 2022-05-23 – 2022-05-25 (×5): 12.5 ug via INTRAVENOUS
  Filled 2022-05-23 (×5): qty 1

## 2022-05-23 MED ORDER — SODIUM CHLORIDE 0.9 % IV SOLN: INTRAVENOUS | Status: DC | PRN

## 2022-05-23 MED ORDER — PROPOFOL 10 MG/ML IV BOLUS
INTRAVENOUS | Status: DC | PRN
  Administered 2022-05-23: 20 mg via INTRAVENOUS
  Administered 2022-05-23 (×2): 40 mg via INTRAVENOUS
  Administered 2022-05-23: 20 mg via INTRAVENOUS
  Administered 2022-05-23: 40 mg via INTRAVENOUS

## 2022-05-23 MED ORDER — MAGNESIUM SULFATE 2 GM/50ML IV SOLN
2.0000 g | Freq: Once | INTRAVENOUS | Status: AC
  Administered 2022-05-23: 2 g via INTRAVENOUS
  Filled 2022-05-23: qty 50

## 2022-05-23 MED ORDER — FENTANYL CITRATE (PF) 100 MCG/2ML IJ SOLN: 12.5000 ug | INTRAMUSCULAR | Status: DC | PRN

## 2022-05-23 SURGICAL SUPPLY — 15 items

## 2022-05-23 NOTE — Progress Notes (Signed)
Daily Progress Note   Patient Name: Betty Jackson       Date: 05/23/2022 DOB: 30-Dec-1934  Age: 88 y.o. MRN#: [id number] Attending Physician: Elodia Florence., * Primary Care Physician: Alvester Morin, MD Admit Date: 05/20/2022  Reason for Consultation/Follow-up: Establishing goals of care  Subjective: I have reviewed medical records including EPIC notes and labs. Received report from primary RN - no acute concerns.   Received email from [place] with [place] documents - printed and will scan into Vyn[employer].   Went to visit patient at bedside - no family/visitors present. Patient was lying in bed awake, alert, and able to participate in conversation. No signs or non-verbal gestures of pain or discomfort noted. No respiratory distress, increased work of breathing, or secretions noted. She reports 0/10 pain and tells me "I feel much, much better." She expresses joy about being back on a regular diet and is anticipating her dinner. Patient was not able to find hearing aid - attempted to help her find in the bed but was unsuccessful. Notified nursing staff to continue to look.  Called daughter/Melissa who was with patient's son - spoke to them both via speakerphone and emotional support provided. Goals are for patient's return to [facility] at discharge. Discussion on the difference between Palliative and Hospice care per their request. Palliative care and hospice have similar goals of managing symptoms, promoting comfort, improving quality of life, and maintaining a person's dignity. However, palliative care may be offered during any phase of a serious illness, while hospice care is usually offered when a person is expected to live for 6 months or less. Family are agreeable to outpatient Palliative  Care to follow at discharge, requesting [facility].    Thanked [name] for sending [employer].   All questions and concerns addressed. Encouraged to call with questions and/or concerns. PMT card previously provided.  Length of Stay: 3  Current Medications: Scheduled Meds:   allopurinol  100 mg Oral Daily   amiodarone  200 mg Oral Daily   atorvastatin  80 mg Oral QHS   Chlorhexidine Gluconate Cloth  6 each Topical Daily   escitalopram  10 mg Oral Daily   fentaNYL  1 patch Transdermal Q72H   loratadine  10 mg Oral Daily   metoprolol tartrate  12.5 mg Oral BID   [  START ON 05/25/2022] pantoprazole  40 mg Intravenous Q12H   [START ON 05/24/2022] predniSONE  30 mg Oral Q breakfast   Followed by   Derrill Memo ON 05/25/2022] predniSONE  20 mg Oral Q breakfast   Followed by   Derrill Memo ON 05/26/2022] predniSONE  10 mg Oral Q breakfast   sodium phosphate  1 enema Rectal Once   [START ON 05/24/2022] thiamine (VITAMIN B1) injection  100 mg Intravenous Daily    Continuous Infusions:  azithromycin 500 mg (05/22/22 2055)   cefTRIAXone (ROCEPHIN)  IV 2 g (05/23/22 1535)   dextrose 5 % and 0.45% NaCl 75 mL/hr at 05/23/22 0513   pantoprazole 8 mg/hr (05/23/22 0830)   thiamine (VITAMIN B1) injection 500 mg (05/22/22 1152)    PRN Meds: acetaminophen **OR** acetaminophen, albuterol, fentaNYL (SUBLIMAZE) injection, hydrocortisone  Physical Exam Vitals and nursing note reviewed.  Constitutional:      General: She is not in acute distress. Pulmonary:     Effort: No respiratory distress.  Skin:    General: Skin is warm and dry.  Neurological:     Mental Status: She is alert.     Motor: Weakness present.  Psychiatric:        Attention and Perception: Attention normal.        Behavior: Behavior is cooperative.             Vital Signs: BP (!) 160/105   Pulse 64   Temp (!) 97.3 F (36.3 C) (Oral)   Resp 14   Ht '5\' 4"'$  (1.626 m)   Wt 82.3 kg   SpO2 97%   BMI 31.14 kg/m  SpO2:  SpO2: 97 % O2 Device: O2 Device: Nasal Cannula O2 Flow Rate: O2 Flow Rate (L/min): 2 L/min  Intake/output summary:  Intake/Output Summary (Last 24 hours) at 05/23/2022 1547 Last data filed at 05/23/2022 [id number] Gross per 24 hour  Intake 1915.09 ml  Output 350 ml  Net 1565.09 ml   LBM: Last BM Date : 05/22/22 Baseline Weight: Weight: 78 kg Most recent weight: Weight: 82.3 kg       Palliative Assessment/Data: PPS 30%      Patient Active Problem List   Diagnosis Date Noted   Gastritis and gastroduodenitis 05/23/2022   Pneumonia 05/20/2022   Urinary tract infection without hematuria    Bleeding hemorrhoids    Rectal ulcer    Hematochezia    Odynophagia    Esophagitis    Lung nodules 09/04/2021   Sepsis secondary to UTI ([place]) 09/03/2021   Atrial fibrillation with RVR ([place]) 08/06/2021   Dysphagia [id number]   Acute metabolic encephalopathy [id number]   Hypoxemia 03/15/2021   Acute kidney injury superimposed on chronic kidney disease ([place]) 03/15/2021   Stroke-like symptoms 07/14/2020   Cough 05/24/2020   Dyspnea 05/24/2020   Optic neuritis 05/24/2020   Osteopenia 05/24/2020   Rash 05/24/2020   Vitamin D deficiency 05/24/2020   Palliative care by specialist    Goals of care, counseling/discussion    DNR (do not resuscitate)    Weakness    Advanced directives, counseling/discussion    Pressure injury of skin 10/18/2019   Closed fracture of right distal femur ([place]) 10/17/2019   Femur fracture, right (HCC) 09/25/2019   Chronic diastolic CHF (congestive heart failure) ([place]) 09/25/2019   HCAP (healthcare-associated pneumonia) 06/19/2019   Encephalopathy 06/19/2019   Lumbar degenerative disc disease    Paroxysmal A-fib (HCC)    Hypomagnesemia    C. difficile colitis    Diarrhea 05/29/2019  CHF (congestive heart failure) ([place]) 05/29/2019   Recurrent falls 04/30/2019   Pericardial effusion without cardiac tamponade 03/29/2019   Hypoalbuminemia 03/27/2019   Cardiac  tamponade    Pericarditis 03/06/2019   Fever    Septic shock (HCC)    Atypical chest pain    Atrial fibrillation with rapid ventricular response (HCC)    Nausea and vomiting 01/17/2019   Acute diverticulitis 01/13/2019   Chronic back pain 01/13/2019   Tachycardia 01/13/2019   Hypokalemia 01/13/2019   Chronic low back pain 07/10/2018   Influenza A 05/20/2017   Severe sepsis ([place]) 05/20/2017   Acute respiratory failure with hypoxia ([place]) 05/20/2017   CKD (chronic kidney disease), stage III ([place]) 05/20/2017   PAF (paroxysmal atrial fibrillation) ([place]) 05/20/2017   HTN (hypertension) 05/20/2017   Giant cell arteritis ([place]) 05/20/2017   Coronary disease 05/20/2017   Abnormal urinalysis 05/20/2017   Osteoporosis 05/08/2016   Herniated nucleus pulposus, L5-S1, right 11/04/2015   Major depressive disorder, recurrent episode, moderate ([place]) 07/16/2013   Abdominal pain, epigastric 06/22/2013   Lumbar stenosis 04/25/2013   Chronic insomnia 02/07/2013   Elevated transaminase level 02/07/2013   CVA (cerebral infarction) 10/29/2012   Visual changes 10/28/2012   Depression 10/28/2012   Anxiety state 10/28/2012   Acute on chronic anemia 10/28/2012   Nonspecific (abnormal) findings on radiological and other examination of gastrointestinal tract 01/25/2012   Hyponatremia 01/24/2012   Hematuria 01/24/2012   Enteritis 12/24/2011   Abdominal pain, other specified site 12/24/2011   Leg pain, right 02/18/2011   Rheumatoid arthritis ([place]) 02/04/2011   Temporal arteritis ([place]) 09/01/2010   Hypertension 09/01/2010   Hyperlipidemia 09/01/2010   History of atrial fibrillation 09/01/2010   CAD (coronary artery disease) 09/01/2010   GERD (gastroesophageal reflux disease) 09/01/2010   Insomnia 09/01/2010    Palliative Care Assessment & Plan   Patient Profile: 88 y.o. female  with past medical history of chronic pain on chronic opiates, A-fib on Eliquis, RA and temporal arteritis on chronic  steroids, CAD, CKD 3, CVA presented to ED on 05/20/2022 [facility] long-term care facility with complaints of chest pain and nausea/vomiting.  Patient was admitted on 05/20/2022 with acute hypercarbic and hypoxic respiratory failure, community-acquired pneumonia, sepsis, acute metabolic encephalopathy, AKI/dehydration/hypernatremia, nausea/vomiting/chest pain/abdominal discomfort, fecal impaction.   Assessment: Principal Problem:   Pneumonia Active Problems:   Gastritis and gastroduodenitis   Recommendations/Plan: Continue to treat the treatable Continue DNR/DNI as previously documented No narcan. In the event of decline, family open for continued discussion around transition to full comfort  Goal is for patient's discharge back to [facility] with outpatient Palliative Care to follow [facility] consulted for: outpatient Palliative Care referral, requesting [facility] obtained listing daughters Wenda Low and Kessie Smither as joint healthcare agents - document will be scanned into Vynca/ACP tab PMT will continue to follow peripherally. If there are any imminent needs please call the service directly   Goals of Care and Additional Recommendations: Limitations on Scope of Treatment: Full Scope Treatment  Code Status:    Code Status Orders  (From admission, onward)           Start     Ordered   05/20/22 1914  Do not attempt resuscitation (DNR)  Continuous       Question Answer Comment  If patient has no pulse and is not breathing Do Not Attempt Resuscitation   If patient has a pulse and/or is breathing: Medical Treatment Goals LIMITED ADDITIONAL INTERVENTIONS: Use medication/IV fluids  and cardiac monitoring as indicated; Do not use intubation or mechanical ventilation (DNI), also provide comfort medications.  Transfer to Progressive/Stepdown as indicated, avoid Intensive Care.   Consent: Discussion documented in EHR or advanced directives reviewed       05/20/22 1914           Code Status History     Date Active Date Inactive Code Status Order ID Comments User Context   05/20/2022 1258 05/20/2022 1914 DNR [id number]  Fransico Meadow, MD ED   09/04/2021 0036 09/11/2021 1756 DNR [id number]  Shela Leff, MD ED   08/05/2021 1801 08/11/2021 1821 DNR [id number]  Guilford Shi, MD Inpatient   08/05/2021 1424 08/05/2021 1455 DNR [id number]  Guilford Shi, MD ED   03/15/2021 0104 03/19/2021 2205 DNR [id number]  Chotiner, Yevonne Aline, MD ED   07/14/2020 1453 07/15/2020 0025 DNR [id number]  Karmen Bongo, MD ED   10/25/2019 2013 10/30/2019 2020 DNR [id number]  Bonnielee Haff, MD ED   10/17/2019 0310 10/23/2019 2327 DNR [id number]  Etta Quill, DO ED   09/25/2019 2029 09/30/2019 0331 DNR [id number]  Toy Baker, MD Inpatient   06/19/2019 2346 06/28/2019 0212 DNR [id number]  Shela Leff, MD ED   06/19/2019 2346 06/19/2019 2346 Full Code [id number]  Shela Leff, MD ED   05/29/2019 0111 06/01/2019 2315 DNR [id number]  Shela Leff, MD ED   04/15/2019 1608 04/21/2019 1833 DNR [id number]  Lequita Halt, MD ED   03/09/2019 0828 03/13/2019 1649 DNR [id number]  Cristal Generous, NP Inpatient   03/08/2019 1225 03/09/2019 0828 Full Code [id number]  Ivin Poot, MD Inpatient   03/06/2019 1802 03/08/2019 1224 DNR [id number]  Candee Furbish, MD Inpatient   03/06/2019 1757 03/06/2019 1802 Full Code [id number]  Candee Furbish, MD Inpatient   01/13/2019 1734 01/20/2019 2138 DNR [id number]  Guilford Shi, MD ED   01/13/2019 1634 01/13/2019 1734 Full Code [id number]  Guilford Shi, MD ED   05/20/2017 1542 05/26/2017 2033 DNR [id number]  Samella Parr, NP ED   11/04/2015 1910 11/05/2015 1658 Full Code [id number]  Kristeen Miss, MD Inpatient   04/25/2013 2250 05/01/2013 1923 Full Code [id number]  Kristeen Miss, MD Inpatient   10/28/2012 1850 10/30/2012 1701 Full Code [id number]  Eugenie Filler, MD Inpatient      Advance Directive Documentation    Flowsheet Row  Most Recent Value  Type of Advance Directive Out of facility DNR (pink MOST or yellow form)  Pre-existing out of facility DNR order (yellow form or pink MOST form) --  "MOST" Form in Place? --       Prognosis:  Unable to determine  Discharge Planning: [place] for rehab with Palliative care service follow-up  Care plan was discussed with primary RN, patient, patient's family  Thank you for allowing the Palliative Medicine Team to assist in the care of this patient.   Lin Landsman, NP  Please contact Palliative Medicine Team phone at [phone] for questions and concerns.   *Portions of this note are a verbal dictation therefore any spelling and/or grammatical errors are due to the "[place] [employer]" system interpretation.

## 2022-05-23 NOTE — Interval H&P Note (Signed)
History and Physical Interval Note:  05/23/2022 8:46 AM  Betty Jackson  has presented today for surgery, with the diagnosis of Anemia, Melena.  The various methods of treatment have been discussed with the patient and family. After consideration of risks, benefits and other options for treatment, the patient has consented to  Procedure(s): ESOPHAGOGASTRODUODENOSCOPY (EGD) WITH PROPOFOL (N/A) as a surgical intervention.  The patient's history has been reviewed, patient examined, no change in status, stable for surgery.  I have reviewed the patient's chart and labs.  Questions were answered to the patient's satisfaction.     [name]

## 2022-05-23 NOTE — Progress Notes (Signed)
Patient back for egd. Patient drowsy but arousable. MP nsr and bp stable. Family at bedside. 02 on at 2l post procedure.

## 2022-05-23 NOTE — Progress Notes (Addendum)
PROGRESS NOTE    Betty Jackson  [id number] DOB: [birth year] DOA: 05/20/2022 PCP: Alvester Morin, MD  Chief Complaint  Patient presents with   Chest Pain   Emesis    Brief Narrative:   Betty Jackson is Betty Jackson 88 y.o. female with medical history significant of chronic pain on chronic opiates, atrial fib (on eliquis), RA and temporal arteritis (on chronic steroids) and other medical issues here with confusion.   She's been admitted with acute metabolic encephalopathy and hypoxia.  She has evidence of pneumonia on chest CT.  Currently being treated for acute hypoxic and hypercarbic respiratory failure due to pneumonia.  Also has encephalopathy related to respiratory failure with contribution from opiates.  Currently providing supportive care.  Complex situation as noted below and in prior notes.    Assessment & Plan:   Principal Problem:   Pneumonia  Addendum: EGD with mild gastritis, unlikely source of potential GI blood loss per GI (but possible).  Recommended considering CTA with additional overt bleeding.  Right now, unless hemodynamically significant bleed, would try to avoid this due to risk of AKI.   Goals of care See prior notes for additional details.  She's had significant improvement over past few days.  Currently DNR/DNI.  Palliative care has been consulted.  Family understands the difficult nature of balancing their mother's opiates/pain control/encephalopathy/respiratory issues.  Will continue lower dose fentanyl patch for now with prn IV pushes for breakthrough pain.  No narcan, family concerned for her comfort.  Palliative care c/s, appreciate recs  Maroon Stool  Concern for GI bleeding PPI gtt Holding heparin/anticoagulation Appreciate GI recs - EGD 2/24  Acute Hypercarbic and Hypoxic Respiratory Failure  Due to pneumonia, opiates/respiratory depression, encephalopathy Treating pna with abx Most recent VBG shows improved CO2, resolved respiratory acidosis.  Hold  further bipap for now.  Consider use going forward based on overall clinical course.   Sepsis due to Community Acquired Pneumonia  CT chest abdomen pelvis with patchy density at both lung bases L>R (scarring vs pneumonia) 2/22 CXR with low lung volumes and bibasilar opacity Improved, currently on RA (chronic home 2 L prn).  Persistent leukocytosis today (sepsis/infection + steroids).  Encephalopathy.  Fits criteria for sepsis with fever, leukocytosis, tachycardia, tachypnea, encephalopathy.  Continue ceftriaxone/azithromycin at this time.  Plan 5 day course. Blood cultures pending, sputum cultures if able MRSA PCR RVP negative.  Negative covid, influenza, RSV Given her chronic steroid use at home, we've transitioned to IV steroids for her acute infection -> wean given improvement and bleeding   Acute Metabolic Encephalopathy  Delirium  Multifactorial -> pneumonia, hypercarbic resp failure, dehydration/AKI/hypernatremia, opiates/sedation --- suspect related to infection + developing AKI + opiates (there was thought that her norco dose was increased, but this doesn't seem to be the case) Follow b12 (wnl), folate (wnl), B1 (pending), VBG (resolved respiratory acidosis), ammonia (pending collection - will hold off for now, not drawn, but she's improved), TSH (elevated - follow free T4) Head CT without acute intracranial abnormality EEG negative for seizures No narcan going forward. MRI brain without acute findings.  Still waxing/waning - this has improved.    Acute Kidney Injury  Dehydration  Hypernatremia Suspect this represents volume depletion and dehydration in setting of her encephalopathy over the last few days Improving.  Continue IVF, will try with D5 1/2 NS.   UA bland CT without hydro  Continue to trend   Nausea  Vomiting  Chest Pain  Abdominal Discomfort Suspect related to pneumonia Negative  troponins Follow, seems improved   Atrial Fibrillation Chadsvasc  6 Anticoagulation on hold with concern for GI bleeding Resume amiodarone and low dose metop Hold diltiazem for now   Fecal Impaction Enema ordered by EDP, follow   Chronic Pain Reduced dose fentanyl patch, with IV fentanyl for breakthrough - continue this for now Hold norco Hold meloxicam while unable to take PO Family concerned about holding meds and prior issues with withdrawal, delirium, precipitated RVR, etc   Rheumatoid Arthritis  Hx Temporal Arteritis Hold sulfasalazine Steroid taper   Gout Resume allopurinol  HLD  CAD lipitor Eliquis on hold  Obesity Body mass index is 31.14 kg/m.    DVT prophylaxis: SCD Code Status: DNR/DNI Family Communication: daughter at bedside Disposition:   Status is: Inpatient Remains inpatient appropriate because: continued need for inpatient care   Consultants:  Palliative care  Procedures:  none  Antimicrobials:  Anti-infectives (From admission, onward)    Start     Dose/Rate Route Frequency Ordered Stop   05/21/22 2000  [MAR Hold]  azithromycin (ZITHROMAX) 500 mg in sodium chloride 0.9 % 250 mL IVPB        (MAR Hold since Sat 05/23/2022 at 0834.Hold Reason: Transfer to [facility] area)   500 mg 250 mL/hr over 60 Minutes Intravenous Every 24 hours 05/20/22 2223 05/26/22 1959   05/21/22 1700  [MAR Hold]  cefTRIAXone (ROCEPHIN) 2 g in sodium chloride 0.9 % 100 mL IVPB        (MAR Hold since Sat 05/23/2022 at 0834.Hold Reason: Transfer to [facility] area)   2 g 200 mL/hr over 30 Minutes Intravenous Every 24 hours 05/20/22 2223 05/26/22 1659   05/20/22 2230  cefTRIAXone (ROCEPHIN) 2 g in sodium chloride 0.9 % 100 mL IVPB  Status:  Discontinued        2 g 200 mL/hr over 30 Minutes Intravenous Every 24 hours 05/20/22 2218 05/20/22 2221   05/20/22 2230  azithromycin (ZITHROMAX) 500 mg in sodium chloride 0.9 % 250 mL IVPB  Status:  Discontinued        500 mg 250 mL/hr over 60 Minutes Intravenous Every 24 hours 05/20/22 2218  05/20/22 2222   05/20/22 1630  cefTRIAXone (ROCEPHIN) 2 g in sodium chloride 0.9 % 100 mL IVPB        2 g 200 mL/hr over 30 Minutes Intravenous  Once 05/20/22 1619 05/20/22 1754   05/20/22 1630  azithromycin (ZITHROMAX) 500 mg in sodium chloride 0.9 % 250 mL IVPB        500 mg 250 mL/hr over 60 Minutes Intravenous  Once 05/20/22 1619 05/20/22 2024       Subjective: Daughter at bedside Issues sleeping overnight   Objective: Vitals:   05/23/22 0000 05/23/22 0429 05/23/22 0700 05/23/22 0839  BP: 129/60 (!) 137/57  (!) 147/67  Pulse: 73 70 70 70  Resp: '15 18 15 '$ (!) 22  Temp:  98.3 F (36.8 C) 97.8 F (36.6 C) 98 F (36.7 C)  TempSrc:  Oral Oral Oral  SpO2: 90% 90% 92% 92%  Weight:      Height:        Intake/Output Summary (Last 24 hours) at 05/23/2022 0853 Last data filed at 05/23/2022 0400 Gross per 24 hour  Intake 2285.09 ml  Output 350 ml  Net 1935.09 ml    Filed Weights   05/20/22 1237 05/22/22 0426  Weight: 78 kg 82.3 kg    Examination:  General: No acute distress. Seen while rolling down to endo.  Lungs: unlabored Neurological: Alert and oriented . Moves all extremities 4 with equal strength. Cranial nerves II through XII grossly intact. Extremities: No clubbing or cyanosis. No edema.  Data Reviewed: I have personally reviewed following labs and imaging studies  CBC: Recent Labs  Lab 05/20/22 2310 05/20/22 2331 05/21/22 0600 05/21/22 0609 05/21/22 2116 05/22/22 0625 05/22/22 1025 05/22/22 1303 05/22/22 1737 05/22/22 2106 05/23/22 0118 05/23/22 0608  WBC 24.7*  --  28.5*  --  25.1* 26.8*  --   --   --   --   --  19.2*  NEUTROABS  --   --   --   --   --  25.2*  --   --   --   --   --   --   HGB 12.9   < > 11.8* 12.2 9.9* 9.8* 11.1* 10.4* 10.4* 9.6* 8.5* 8.4*  HCT 41.7   < > 39.1 36.0 32.9* 31.9* 34.8*  --   --   --   --  27.4*  MCV 103.0*  --  104.0*  --  103.8* 102.6*  --   --   --   --   --  101.1*  PLT 178  --  189  --  183 185  --   --   --    --   --  161   < > = values in this interval not displayed.    Basic Metabolic Panel: Recent Labs  Lab 05/20/22 1316 05/20/22 1409 05/20/22 1425 05/20/22 2310 05/20/22 2331 05/21/22 0600 05/21/22 0609 05/21/22 1519 05/22/22 0625 05/23/22 0608  NA 147* 150*   < >  --    < > 149* 150* 146* 142 144  K 3.7 3.6   < >  --    < > 3.8 3.8 4.6 4.1 3.9  CL 107 108  --   --   --  112*  --  111 111 114*  CO2 28  --   --   --   --  22  --  25 25 21*  GLUCOSE 123* 121*  --   --   --  173*  --  281* 174* 107*  BUN 31* 33*  --   --   --  31*  --  38* 42* 45*  CREATININE 1.65* 1.70*  --  1.82*  --  2.30*  --  2.58* 2.43* 2.24*  CALCIUM 9.3  --   --   --   --  8.6*  --  7.9* 7.4* 7.2*  MG  --   --   --   --   --   --   --   --  1.5* 1.5*  PHOS  --   --   --   --   --   --   --   --  3.3 2.9   < > = values in this interval not displayed.    GFR: Estimated Creatinine Clearance: 19 mL/min (Kiptyn Rafuse) (by C-G formula based on SCr of 2.24 mg/dL (H)).  Liver Function Tests: Recent Labs  Lab 05/20/22 1316 05/21/22 0600 05/22/22 0625 05/23/22 0608  AST 24 41 29 26  ALT '14 19 16 15  '$ ALKPHOS 81 105 74 66  BILITOT 1.4* 1.0 0.5 0.4  PROT 6.5 5.7* 5.1* 4.7*  ALBUMIN 3.4* 2.7* 2.4* 2.4*    CBG: Recent Labs  Lab 05/21/22 0353 05/21/22 2026  GLUCAP 155* 217*     Recent Results (from the past  240 hour(s))  Resp panel by RT-PCR (RSV, Flu Betty Jackson&B, Covid) Anterior Nasal Swab     Status: None   Collection Time: 05/20/22 12:41 PM   Specimen: Anterior Nasal Swab  Result Value Ref Range Status   SARS Coronavirus 2 by RT PCR NEGATIVE NEGATIVE Final   Influenza Henri Guedes by PCR NEGATIVE NEGATIVE Final   Influenza B by PCR NEGATIVE NEGATIVE Final    Comment: (NOTE) The Xpert Xpress SARS-CoV-2/FLU/RSV plus assay is intended as an aid in the diagnosis of influenza from Nasopharyngeal swab specimens and should not be used as Kelee Cunningham sole basis for treatment. Nasal washings and aspirates are unacceptable for Xpert Xpress  SARS-CoV-2/FLU/RSV testing.  Fact Sheet for Patients: EntrepreneurPulse.com.au  Fact Sheet for Healthcare Providers: IncredibleEmployment.be  This test is not yet approved or cleared by the [place] FDA and has been authorized for detection and/or diagnosis of SARS-CoV-2 by FDA under an Emergency Use Authorization (EUA). This EUA will remain in effect (meaning this test can be used) for the duration of the COVID-19 declaration under Section 564(b)(1) of the Act, 21 U.S.C. section 360bbb-3(b)(1), unless the authorization is terminated or revoked.     Resp Syncytial Virus by PCR NEGATIVE NEGATIVE Final    Comment: (NOTE) Fact Sheet for Patients: EntrepreneurPulse.com.au  Fact Sheet for Healthcare Providers: IncredibleEmployment.be  This test is not yet approved or cleared by the [place] FDA and has been authorized for detection and/or diagnosis of SARS-CoV-2 by FDA under an Emergency Use Authorization (EUA). This EUA will remain in effect (meaning this test can be used) for the duration of the COVID-19 declaration under Section 564(b)(1) of the Act, 21 U.S.C. section 360bbb-3(b)(1), unless the authorization is terminated or revoked.  Performed at [facility], [place] [address], [place], [place] [address]   Respiratory (~20 pathogens) panel by PCR     Status: None   Collection Time: 05/20/22  1:48 PM   Specimen: Nasopharyngeal Swab; Respiratory  Result Value Ref Range Status   Adenovirus NOT DETECTED NOT DETECTED Final   Coronavirus 229E NOT DETECTED NOT DETECTED Final    Comment: (NOTE) The Coronavirus on the Respiratory Panel, DOES NOT test for the novel  Coronavirus (2019 nCoV)    Coronavirus HKU1 NOT DETECTED NOT DETECTED Final   Coronavirus NL63 NOT DETECTED NOT DETECTED Final   Coronavirus OC43 NOT DETECTED NOT DETECTED Final   Metapneumovirus NOT DETECTED NOT DETECTED Final    Rhinovirus / Enterovirus NOT DETECTED NOT DETECTED Final   Influenza Twylah Bennetts NOT DETECTED NOT DETECTED Final   Influenza B NOT DETECTED NOT DETECTED Final   Parainfluenza Virus 1 NOT DETECTED NOT DETECTED Final   Parainfluenza Virus 2 NOT DETECTED NOT DETECTED Final   Parainfluenza Virus 3 NOT DETECTED NOT DETECTED Final   Parainfluenza Virus 4 NOT DETECTED NOT DETECTED Final   Respiratory Syncytial Virus NOT DETECTED NOT DETECTED Final   Bordetella pertussis NOT DETECTED NOT DETECTED Final   Bordetella Parapertussis NOT DETECTED NOT DETECTED Final   Chlamydophila pneumoniae NOT DETECTED NOT DETECTED Final   Mycoplasma pneumoniae NOT DETECTED NOT DETECTED Final    Comment: Performed at [facility], [place]. [address] [place], [place] [address]  Culture, blood (routine x 2) Call MD if unable to obtain prior to antibiotics being given     Status: None (Preliminary result)   Collection Time: 05/20/22 10:38 PM   Specimen: BLOOD RIGHT HAND  Result Value Ref Range Status   Specimen Description BLOOD RIGHT HAND  Final   Special Requests   Final    BOTTLES DRAWN AEROBIC AND ANAEROBIC Blood Culture results may not be optimal due to an inadequate volume of blood received in culture bottles   Culture   Final    NO GROWTH 3 DAYS Performed at [facility], [place] [address] [place], [place] [address]    Report Status PENDING  Incomplete  Culture, blood (routine x 2) Call MD if unable to obtain prior to antibiotics being given     Status: None (Preliminary result)   Collection Time: 05/20/22 10:38 PM   Specimen: BLOOD LEFT HAND  Result Value Ref Range Status   Specimen Description BLOOD LEFT HAND  Final   Special Requests   Final    BOTTLES DRAWN AEROBIC AND ANAEROBIC Blood Culture results may not be optimal due to an inadequate volume of blood received in culture bottles   Culture   Final    NO GROWTH 3 DAYS Performed at [facility], [place] [address], [place], [place] [address]     Report Status PENDING  Incomplete         Radiology Studies: MR BRAIN WO CONTRAST  Result Date: 05/22/2022 CLINICAL DATA:  Altered mental status EXAM: MRI HEAD WITHOUT CONTRAST TECHNIQUE: Multiplanar, multiecho pulse sequences of the brain and surrounding structures were obtained without intravenous contrast. COMPARISON:  None Available. FINDINGS: Brain: No acute infarct, mass effect or extra-axial collection. No chronic microhemorrhage or siderosis. There is multifocal hyperintense T2-weighted signal within the white matter. Generalized volume loss. The midline structures are normal. Vascular: Normal flow voids. Skull and upper cervical spine: Normal marrow signal. Sinuses/Orbits: Paranasal sinuses are clear. There is bilateral mastoid fluid. Bilateral ocular lens replacements. Other: None. IMPRESSION: 1. No acute intracranial abnormality. 2. Findings of chronic small vessel ischemia and volume loss. Electronically Signed   By: Ulyses Jarred M.D.   On: 05/22/2022 22:23   DG Abd Portable 1V  Result Date: 05/22/2022 CLINICAL DATA:  Encounter for imaging to screen for metal prior to MRI. EXAM: PORTABLE ABDOMEN - 1 VIEW COMPARISON:  CT 05/20/2022 FINDINGS: Posterior rod with intrapedicular screw fusion and interbody spacers from L2 through L4. Right hip arthroplasty. Cholecystectomy clips in the right upper quadrant. Vertebral augmentation within L1 and T11. Normal bowel gas pattern. Vascular calcifications. IMPRESSION: 1. Surgical hardware in the lumbar spine with right hip arthroplasty. 2. Right upper quadrant cholecystectomy clips. 3. L1 and T11 vertebral plasty. 4. No MRI contraindications. Electronically Signed   By: Keith Rake M.D.   On: 05/22/2022 20:59   EEG adult  Result Date: 05/22/2022 Lora [place], MD     05/22/2022 10:46 AM Patient Name: FRITZI GRINDE MRN: [id number] Epilepsy Attending: Lora [place] Referring Physician/Provider: Elodia Florence., MD Date: 2/23/204  Duration: 23.40 mins Patient history: 88yo F with ams. EEG to evaluate for seizure. Level of alertness: Awake, asleep AEDs during EEG study: None Technical aspects: This EEG study was done with scalp electrodes positioned according to the 10-20 International system of electrode placement. Electrical activity was reviewed with band pass filter of 1-'70Hz'$ , sensitivity of 7 uV/mm, display speed of 64m/sec with Viral Schramm '60Hz'$  notched filter applied as appropriate. EEG data were recorded continuously and digitally stored.  Video monitoring was available and reviewed as appropriate. Description: The posterior dominant rhythm consists of 7 Hz activity of moderate voltage (25-35 uV) seen predominantly in posterior head regions, symmetric and reactive to eye opening and eye closing. Drowsiness was  characterized by attenuation of the posterior background rhythm. Sleep was characterized by vertex waves, sleep spindles (12 to 14 Hz), maximal frontocentral region.  EEG showed continuous generalized 5 to 7 Hz theta slowing. Hyperventilation and photic stimulation were not performed.   ABNORMALITY - Continuous slow, generalized - Background slow IMPRESSION: This study is suggestive of moderate diffuse encephalopathy, nonspecific etiology. No seizures or epileptiform discharges were seen throughout the recording. Priyanka Barbra Sarks        Scheduled Meds:  [MAR Hold] allopurinol  100 mg Oral Daily   [MAR Hold] amiodarone  200 mg Oral Daily   [MAR Hold] atorvastatin  80 mg Oral QHS   [MAR Hold] Chlorhexidine Gluconate Cloth  6 each Topical Daily   [MAR Hold] escitalopram  10 mg Oral Daily   [MAR Hold] fentaNYL  1 patch Transdermal Q72H   [MAR Hold] loratadine  10 mg Oral Daily   [MAR Hold] metoprolol tartrate  12.5 mg Oral BID   [MAR Hold] pantoprazole  40 mg Intravenous Q12H   [MAR Hold] predniSONE  40 mg Oral Q breakfast   Followed by   [id number] Hold] predniSONE  30 mg Oral Q breakfast   Followed by   [facility] Hold] predniSONE   20 mg Oral Q breakfast   Followed by   [facility] Hold] predniSONE  10 mg Oral Q breakfast   [MAR Hold] sodium phosphate  1 enema Rectal Once   [MAR Hold] thiamine (VITAMIN B1) injection  100 mg Intravenous Daily   Continuous Infusions:  [MAR Hold] azithromycin 500 mg (05/22/22 2055)   [MAR Hold] cefTRIAXone (ROCEPHIN)  IV 2 g (05/22/22 1713)   dextrose 5 % and 0.45% NaCl 75 mL/hr at 05/23/22 0513   [MAR Hold] magnesium sulfate bolus IVPB     pantoprazole 8 mg/hr (05/23/22 0222)   [MAR Hold] thiamine (VITAMIN B1) injection 500 mg (05/22/22 1152)     LOS: 3 days    Time spent: over 30 min    Fayrene Helper, MD [facility]   To contact the attending provider between 7A-7P or the covering provider during after hours 7P-7A, please log into the web site [email or web address] and access using universal [place] password for that web site. If you do not have the password, please call the hospital operator.  05/23/2022, 8:53 AM

## 2022-05-23 NOTE — Anesthesia Preprocedure Evaluation (Addendum)
Anesthesia Evaluation  Patient identified by MRN, date of birth, ID band Patient awake    Reviewed: Allergy & Precautions, NPO status , Patient's Chart, lab work & pertinent test results, reviewed documented beta blocker date and time   Airway Mallampati: II  TM Distance: >3 FB Neck ROM: Full    Dental  (+) Teeth Intact, Dental Advisory Given, Missing   Pulmonary shortness of breath   Pulmonary exam normal breath sounds clear to auscultation       Cardiovascular hypertension, Pt. on medications and Pt. on home beta blockers + CAD, + Cardiac Stents and +CHF  Normal cardiovascular exam+ dysrhythmias Atrial Fibrillation  Rhythm:Regular Rate:Normal     Neuro/Psych  PSYCHIATRIC DISORDERS Anxiety Depression    CVA    GI/Hepatic Neg liver ROS, PUD,GERD  Medicated,,  Endo/Other  negative endocrine ROS    Renal/GU Renal InsufficiencyRenal disease     Musculoskeletal  (+) Arthritis , Rheumatoid disorders,    Abdominal   Peds  Hematology  (+) Blood dyscrasia (Eliquis), anemia   Anesthesia Other Findings   Reproductive/Obstetrics                             Anesthesia Physical Anesthesia Plan  ASA: 4  Anesthesia Plan: MAC   Post-op Pain Management: Minimal or no pain anticipated   Induction: Intravenous  PONV Risk Score and Plan: 2 and Treatment may vary due to age or medical condition  Airway Management Planned: Natural Airway and Simple Face Mask  Additional Equipment:   Intra-op Plan:   Post-operative Plan:   Informed Consent: I have reviewed the patients History and Physical, chart, labs and discussed the procedure including the risks, benefits and alternatives for the proposed anesthesia with the patient or authorized representative who has indicated his/her understanding and acceptance.   Patient has DNR.  Discussed DNR with patient and Suspend DNR.   Dental advisory  given  Plan Discussed with: CRNA  Anesthesia Plan Comments:         Anesthesia Quick Evaluation

## 2022-05-23 NOTE — Anesthesia Procedure Notes (Signed)
Procedure Name: MAC Date/Time: 05/23/2022 9:17 AM  Performed by: Renato Shin, CRNAPre-anesthesia Checklist: Patient identified, Emergency Drugs available, Suction available and Patient being monitored Patient Re-evaluated:Patient Re-evaluated prior to induction Oxygen Delivery Method: Nasal cannula Preoxygenation: Pre-oxygenation with 100% oxygen Induction Type: IV induction Airway Equipment and Method: Bite block Placement Confirmation: positive ETCO2 and breath sounds checked- equal and bilateral Dental Injury: Teeth and Oropharynx as per pre-operative assessment

## 2022-05-23 NOTE — Op Note (Addendum)
[facility] Patient Name: Betty Jackson Procedure Date : 05/23/2022 MRN: [id number] Attending MD: [name] MD, MD, [id number] Date of Birth: Nov 23, 1934 CSN: [id number] Age: 88 Admit Type: Inpatient Procedure:                Upper GI endoscopy Indications:              Melena, mdil progressive anemia that may have a                            component of dilution                           Endoscopic history includes an EGD 5/23 for                            dysphagia with severe esophagitis. EGD and                            colonoscopy 6/23 for hematochezia: Colonoscopy                            showed hemorrhoids, rectal ulcer, left-sided                            diverticulosis, and small polyps; EGD showed                            gastritis and resolution of esophagitis Providers:                [name] MD, MD, Vista Lawman, RN, Ladona Ridgel, Technician Referring MD:              Medicines:                Monitored Anesthesia Care Complications:            No immediate complications. Estimated Blood Loss:     Estimated blood loss was minimal. Procedure:                Pre-Anesthesia Assessment:                           - Prior to the procedure, a History and Physical                            was performed, and patient medications and                            allergies were reviewed. The patient's tolerance of                            previous anesthesia was also reviewed. The risks                            and benefits of the procedure and the  sedation                            options and risks were discussed with the patient.                            All questions were answered, and informed consent                            was obtained. Prior Anticoagulants: The patient has                            taken Eliquis (apixaban), last dose was 3 days                            prior to procedure. ASA  Grade Assessment: IV - A                            patient with severe systemic disease that is a                            constant threat to life. After reviewing the risks                            and benefits, the patient was deemed in                            satisfactory condition to undergo the procedure.                           After obtaining informed consent, the endoscope was                            passed under direct vision. Throughout the                            procedure, the patient's blood pressure, pulse, and                            oxygen saturations were monitored continuously. The                            [id number] Olympus endoscope was introduced                            through the mouth, and advanced to the third part                            of duodenum. The upper GI endoscopy was                            accomplished without difficulty. The patient  tolerated the procedure well. Scope In: Scope Out: Findings:      The esophagus was normal. No esophagitis present.      A small hiatal hernia was present.      Diffuse mild inflammation characterized by erythema, friability and       granularity was found in the gastric fundus, in the gastric body and in       the gastric antrum. No gastric erosion or ulcer seen. Biopsies were       taken from the antrum, body, and fundus with a cold forceps for       histology. Estimated blood loss was minimal.      The examined duodenum was normal. Biopsies were taken with a cold       forceps for histology. Estimated blood loss was minimal. Impression:               - Normal esophagus.                           - Small hiatal hernia.                           - Mild gastritis. Biopsied. As this was mild, I                            think it is unlikely to be the cause of potential                            GI blood loss even in the setting of chronic                             Eliquis, but this remains a possibility.                           - Normal examined duodenum. Biopsied. Recommendation:           - Return patient to hospital ward for ongoing care.                           - Advance diet as tolerated.                           - Continue present medications.                           - Will need to carefully weigh the risks and                            benefits of resuming Eliquis given the potential                            for recurrent bleeding.                           - Await pathology results.                           - Serial  hgb/hct with additional transfusion as                            indicated.                           - Repeat colonoscopy may be low yield given the                            high quality study 6/23.                           - Consider CTA with additional overt bleeding.                           Results discussed with daughter, Betty Jackson, and son,                            Betty Jackson, by phone, at [phone].                           Results discuss with Dr. Florene Glen.                           The inpatient GI team will move to stand-by. Please                            call with any additional questions or concerns or                            with further bleeding. Procedure Code(s):        --- Professional ---                           [phone], Esophagogastroduodenoscopy, flexible,                            transoral; with biopsy, single or multiple Diagnosis Code(s):        --- Professional ---                           K29.70, Gastritis, unspecified, without bleeding                           K44.9, Diaphragmatic hernia without obstruction or                            gangrene                           K92.1, Melena (includes Hematochezia) CPT copyright 2022 American Medical Association. All rights reserved. The codes documented in this report are preliminary and upon coder review may  be  revised to meet current compliance requirements. [name] MD, MD 05/23/2022 9:55:28 AM This report has been signed electronically. Number of Addenda: 0

## 2022-05-23 NOTE — Anesthesia Postprocedure Evaluation (Signed)
Anesthesia Post Note  Patient: Betty Jackson  Procedure(s) Performed: ESOPHAGOGASTRODUODENOSCOPY (EGD) WITH PROPOFOL BIOPSY     Patient location during evaluation: PACU Anesthesia Type: MAC Level of consciousness: awake and alert Pain management: pain level controlled Vital Signs Assessment: post-procedure vital signs reviewed and stable Respiratory status: spontaneous breathing, nonlabored ventilation, respiratory function stable and patient connected to nasal cannula oxygen Cardiovascular status: stable and blood pressure returned to baseline Postop Assessment: no apparent nausea or vomiting Anesthetic complications: no   No notable events documented.  Last Vitals:  Vitals:   05/23/22 1529 05/23/22 1530  BP: (!) 158/58 (!) 160/105  Pulse: 63 64  Resp: 14 14  Temp: (!) 36.3 C   SpO2: 94% 97%    Last Pain:  Vitals:   05/23/22 1529  TempSrc: Oral  PainSc: 0-No pain                 Santa Lighter

## 2022-05-23 NOTE — Progress Notes (Signed)
Patient to egd.Patient a/0 x3. Family at bedside.

## 2022-05-23 NOTE — Transfer of Care (Signed)
Immediate Anesthesia Transfer of Care Note  Patient: Betty Jackson  Procedure(s) Performed: ESOPHAGOGASTRODUODENOSCOPY (EGD) WITH PROPOFOL BIOPSY  Patient Location: PACU  Anesthesia Type:MAC  Level of Consciousness: drowsy and patient cooperative  Airway & Oxygen Therapy: Patient Spontanous Breathing and Patient connected to nasal cannula oxygen  Post-op Assessment: Report given to RN and Post -op Vital signs reviewed and stable  Post vital signs: Reviewed and stable  Last Vitals:  Vitals Value Taken Time  BP 99/44 05/23/22 0933  Temp    Pulse 60 05/23/22 0935  Resp 15 05/23/22 0935  SpO2 92 % 05/23/22 0935  Vitals shown include unvalidated device data.  Last Pain:  Vitals:   05/23/22 0839  TempSrc: Oral  PainSc: 0-No pain         Complications: No notable events documented.

## 2022-05-24 DIAGNOSIS — R0902 Hypoxemia: Secondary | ICD-10-CM | POA: Diagnosis not present

## 2022-05-24 DIAGNOSIS — N179 Acute kidney failure, unspecified: Secondary | ICD-10-CM | POA: Diagnosis not present

## 2022-05-24 DIAGNOSIS — R112 Nausea with vomiting, unspecified: Secondary | ICD-10-CM | POA: Diagnosis not present

## 2022-05-24 DIAGNOSIS — J189 Pneumonia, unspecified organism: Secondary | ICD-10-CM | POA: Diagnosis not present

## 2022-05-24 LAB — COMPREHENSIVE METABOLIC PANEL
ALT: 15 U/L (ref 0–44)
AST: 22 U/L (ref 15–41)
Albumin: 2.4 g/dL — ABNORMAL LOW (ref 3.5–5.0)
Alkaline Phosphatase: 65 U/L (ref 38–126)
Anion gap: 7 (ref 5–15)
BUN: 37 mg/dL — ABNORMAL HIGH (ref 8–23)
CO2: 24 mmol/L (ref 22–32)
Calcium: 7.5 mg/dL — ABNORMAL LOW (ref 8.9–10.3)
Chloride: 114 mmol/L — ABNORMAL HIGH (ref 98–111)
Creatinine, Ser: 1.62 mg/dL — ABNORMAL HIGH (ref 0.44–1.00)
GFR, Estimated: 31 mL/min — ABNORMAL LOW (ref >60)
Glucose, Bld: 120 mg/dL — ABNORMAL HIGH (ref 70–99)
Potassium: 3.9 mmol/L (ref 3.5–5.1)
Sodium: 145 mmol/L (ref 135–145)
Total Bilirubin: 0.6 mg/dL (ref 0.3–1.2)
Total Protein: 4.8 g/dL — ABNORMAL LOW (ref 6.5–8.1)

## 2022-05-24 LAB — CBC
HCT: 25 % — ABNORMAL LOW (ref 36.0–46.0)
Hemoglobin: 8.7 g/dL — ABNORMAL LOW (ref 12.0–15.0)
MCH: 36.1 pg — ABNORMAL HIGH (ref 26.0–34.0)
MCHC: 34.8 g/dL (ref 30.0–36.0)
MCV: 103.7 fL — ABNORMAL HIGH (ref 80.0–100.0)
Platelets: 157 10*3/uL (ref 150–400)
RBC: 2.41 MIL/uL — ABNORMAL LOW (ref 3.87–5.11)
RDW: 14.3 % (ref 11.5–15.5)
WBC: 14.3 10*3/uL — ABNORMAL HIGH (ref 4.0–10.5)
nRBC: 0.2 % (ref 0.0–0.2)

## 2022-05-24 LAB — MAGNESIUM: Magnesium: 2.4 mg/dL (ref 1.7–2.4)

## 2022-05-24 LAB — PHOSPHORUS: Phosphorus: 2.3 mg/dL — ABNORMAL LOW (ref 2.5–4.6)

## 2022-05-24 LAB — HEPARIN LEVEL (UNFRACTIONATED): Heparin Unfractionated: >1.1 IU/mL — ABNORMAL HIGH (ref 0.30–0.70)

## 2022-05-24 NOTE — Progress Notes (Signed)
PROGRESS NOTE    Betty Jackson  [id number] DOB: [birth year] DOA: 05/20/2022 PCP: Alvester Morin, MD  Chief Complaint  Patient presents with   Chest Pain   Emesis    Brief Narrative:   Betty Jackson is Betty Jackson 88 y.o. female with medical history significant of chronic pain on chronic opiates, atrial fib (on eliquis), RA and temporal arteritis (on chronic steroids) and other medical issues here with confusion.   She's been admitted with acute metabolic encephalopathy and hypoxia.  She has evidence of pneumonia on chest CT.  Currently being treated for acute hypoxic and hypercarbic respiratory failure due to pneumonia.  Also has encephalopathy related to respiratory failure with contribution from opiates.  Currently providing supportive care.  Complex situation as noted below and in prior notes.    Assessment & Plan:   Principal Problem:   Pneumonia Active Problems:   Gastritis and gastroduodenitis  Goals of care See prior notes for additional details.  She's had significant improvement over past few days.  Currently DNR/DNI.  Palliative care has been consulted.  Family understands the difficult nature of balancing their mother's opiates/pain control/encephalopathy/respiratory issues.  Will continue lower dose fentanyl patch for now with prn IV pushes for breakthrough pain.  No narcan, family concerned for her comfort.  Palliative care c/s, appreciate recs  Maroon Stool  Concern for GI bleeding PPI gtt Holding heparin/anticoagulation Appreciate GI recs - EGD 2/24  mild gastritis, unlikely source of potential GI blood loss per GI (but possible).  Recommended considering CTA with additional overt bleeding.  Right now, unless hemodynamically significant bleed, would try to avoid this due to risk of AKI.   Acute Hypercarbic and Hypoxic Respiratory Failure  Due to pneumonia, opiates/respiratory depression, encephalopathy Treating pna with abx Most recent VBG shows improved CO2,  resolved respiratory acidosis.  Hold further bipap for now.  Consider use going forward based on overall clinical course.   Sepsis due to Community Acquired Pneumonia  CT chest abdomen pelvis with patchy density at both lung bases L>R (scarring vs pneumonia) 2/22 CXR with low lung volumes and bibasilar opacity Improved, currently on RA (chronic home 2 L prn).  Persistent leukocytosis today (sepsis/infection + steroids).  Encephalopathy.  Fits criteria for sepsis with fever, leukocytosis, tachycardia, tachypnea, encephalopathy.  Continue ceftriaxone/azithromycin at this time.  Plan 5 day course. Blood cultures pending, sputum cultures if able MRSA PCR RVP negative.  Negative covid, influenza, RSV Given her chronic steroid use at home, we've transitioned to IV steroids for her acute infection -> wean given improvement and bleeding   Acute Metabolic Encephalopathy  Delirium  Multifactorial -> pneumonia, hypercarbic resp failure, dehydration/AKI/hypernatremia, opiates/sedation --- suspect related to infection + developing AKI + opiates (there was thought that her norco dose was increased, but this doesn't seem to be the case) Follow b12 (wnl), folate (wnl), B1 (pending), VBG (resolved respiratory acidosis), ammonia (pending collection - will hold off for now, not drawn, but she's improved), TSH (elevated - follow free T4 - mildly elevated -> repeat after acute infection ) Head CT without acute intracranial abnormality EEG negative for seizures No narcan going forward. MRI brain without acute findings.  Still waxing/waning - this has improved.    Acute Kidney Injury  Dehydration  Hypernatremia Suspect this represents volume depletion and dehydration in setting of her encephalopathy over the last few days Improving.  Continue IVF, will try with D5 1/2 NS.   UA bland CT without hydro  Continue to trend   Nausea  Vomiting  Chest Pain  Abdominal Discomfort Suspect related to  pneumonia Negative troponins Follow, seems improved   Atrial Fibrillation Chadsvasc 6 Anticoagulation on hold with concern for GI bleeding Resume amiodarone and low dose metop Hold diltiazem for now   Fecal Impaction Enema ordered by EDP, follow   Chronic Pain Reduced dose fentanyl patch, with IV fentanyl for breakthrough - continue this for now Hold norco Hold meloxicam while unable to take PO Family concerned about holding meds and prior issues with withdrawal, delirium, precipitated RVR, etc   Rheumatoid Arthritis  Hx Temporal Arteritis Hold sulfasalazine Steroid taper   Gout Resume allopurinol  HLD  CAD lipitor Eliquis on hold  Obesity Body mass index is 31.45 kg/m.    DVT prophylaxis: SCD Code Status: DNR/DNI Family Communication: daughter over phone Disposition:   Status is: Inpatient Remains inpatient appropriate because: continued need for inpatient care   Consultants:  Palliative care  Procedures:  none  Antimicrobials:  Anti-infectives (From admission, onward)    Start     Dose/Rate Route Frequency Ordered Stop   05/21/22 2000  azithromycin (ZITHROMAX) 500 mg in sodium chloride 0.9 % 250 mL IVPB        500 mg 250 mL/hr over 60 Minutes Intravenous Every 24 hours 05/20/22 2223 05/26/22 1959   05/21/22 1700  cefTRIAXone (ROCEPHIN) 2 g in sodium chloride 0.9 % 100 mL IVPB        2 g 200 mL/hr over 30 Minutes Intravenous Every 24 hours 05/20/22 2223 05/28/22 1659   05/20/22 2230  cefTRIAXone (ROCEPHIN) 2 g in sodium chloride 0.9 % 100 mL IVPB  Status:  Discontinued        2 g 200 mL/hr over 30 Minutes Intravenous Every 24 hours 05/20/22 2218 05/20/22 2221   05/20/22 2230  azithromycin (ZITHROMAX) 500 mg in sodium chloride 0.9 % 250 mL IVPB  Status:  Discontinued        500 mg 250 mL/hr over 60 Minutes Intravenous Every 24 hours 05/20/22 2218 05/20/22 2222   05/20/22 1630  cefTRIAXone (ROCEPHIN) 2 g in sodium chloride 0.9 % 100 mL IVPB        2  g 200 mL/hr over 30 Minutes Intravenous  Once 05/20/22 1619 05/20/22 1754   05/20/22 1630  azithromycin (ZITHROMAX) 500 mg in sodium chloride 0.9 % 250 mL IVPB        500 mg 250 mL/hr over 60 Minutes Intravenous  Once 05/20/22 1619 05/20/22 2024       Subjective: No complaints this morning No family at bedside, discussed with daughter over phone later - concern about lack of sleep/blood draws  Objective: Vitals:   05/24/22 1100 05/24/22 1200 05/24/22 1210 05/24/22 1215  BP:   (!) 178/61 (!) 163/58  Pulse: 71  63 64  Resp: (!) '26  19 19  '$ Temp:  (!) 97.3 F (36.3 C)    TempSrc:  Oral    SpO2: 93%  95% 95%  Weight:      Height:        Intake/Output Summary (Last 24 hours) at 05/24/2022 1258 Last data filed at 05/24/2022 0500 Gross per 24 hour  Intake 1643.1 ml  Output 500 ml  Net 1143.1 ml    Filed Weights   05/20/22 1237 05/22/22 0426 05/24/22 0500  Weight: 78 kg 82.3 kg 83.1 kg    Examination:  General: No acute distress. Cardiovascular: RRR Lungs: unlabored Abdomen: Soft, nontender, nondistended Neurological: Alert and oriented 3. Moves all extremities 4  with equal strength. Cranial nerves II through XII grossly intact. Extremities: No clubbing or cyanosis. No edema.   Data Reviewed: I have personally reviewed following labs and imaging studies  CBC: Recent Labs  Lab 05/21/22 0600 05/21/22 0609 05/21/22 2116 05/22/22 0625 05/22/22 1025 05/22/22 1303 05/22/22 2106 05/23/22 0118 05/23/22 0608 05/23/22 1755 05/24/22 0830  WBC 28.5*  --  25.1* 26.8*  --   --   --   --  19.2*  --  14.3*  NEUTROABS  --   --   --  25.2*  --   --   --   --   --   --   --   HGB 11.8*   < > 9.9* 9.8* 11.1*   < > 9.6* 8.5* 8.4* 9.2* 8.7*  HCT 39.1   < > 32.9* 31.9* 34.8*  --   --   --  27.4* 29.9* 25.0*  MCV 104.0*  --  103.8* 102.6*  --   --   --   --  101.1*  --  103.7*  PLT 189  --  183 185  --   --   --   --  161  --  157   < > = values in this interval not displayed.     Basic Metabolic Panel: Recent Labs  Lab 05/21/22 0600 05/21/22 0609 05/21/22 1519 05/22/22 0625 05/23/22 0608 05/24/22 0241  NA 149* 150* 146* 142 144 145  K 3.8 3.8 4.6 4.1 3.9 3.9  CL 112*  --  111 111 114* 114*  CO2 22  --  25 25 21* 24  GLUCOSE 173*  --  281* 174* 107* 120*  BUN 31*  --  38* 42* 45* 37*  CREATININE 2.30*  --  2.58* 2.43* 2.24* 1.62*  CALCIUM 8.6*  --  7.9* 7.4* 7.2* 7.5*  MG  --   --   --  1.5* 1.5* 2.4  PHOS  --   --   --  3.3 2.9 2.3*    GFR: Estimated Creatinine Clearance: 26.5 mL/min (Margia Wiesen) (by C-G formula based on SCr of 1.62 mg/dL (H)).  Liver Function Tests: Recent Labs  Lab 05/20/22 1316 05/21/22 0600 05/22/22 0625 05/23/22 0608 05/24/22 0241  AST 24 41 '29 26 22  '$ ALT '14 19 16 15 15  '$ ALKPHOS 81 105 74 66 65  BILITOT 1.4* 1.0 0.5 0.4 0.6  PROT 6.5 5.7* 5.1* 4.7* 4.8*  ALBUMIN 3.4* 2.7* 2.4* 2.4* 2.4*    CBG: Recent Labs  Lab 05/21/22 0353 05/21/22 2026  GLUCAP 155* 217*     Recent Results (from the past 240 hour(s))  Resp panel by RT-PCR (RSV, Flu Imran Nuon&B, Covid) Anterior Nasal Swab     Status: None   Collection Time: 05/20/22 12:41 PM   Specimen: Anterior Nasal Swab  Result Value Ref Range Status   SARS Coronavirus 2 by RT PCR NEGATIVE NEGATIVE Final   Influenza Tymeir [place] by PCR NEGATIVE NEGATIVE Final   Influenza B by PCR NEGATIVE NEGATIVE Final    Comment: (NOTE) The Xpert Xpress SARS-CoV-2/FLU/RSV plus assay is intended as an aid in the diagnosis of influenza from Nasopharyngeal swab specimens and should not be used as Sharda Keddy sole basis for treatment. Nasal washings and aspirates are unacceptable for Xpert Xpress SARS-CoV-2/FLU/RSV testing.  Fact Sheet for Patients: EntrepreneurPulse.com.au  Fact Sheet for Healthcare Providers: IncredibleEmployment.be  This test is not yet approved or cleared by the [place] FDA and has been authorized for detection and/or diagnosis  of SARS-CoV-2 by FDA  under an Emergency Use Authorization (EUA). This EUA will remain in effect (meaning this test can be used) for the duration of the COVID-19 declaration under Section 564(b)(1) of the Act, 21 U.S.C. section 360bbb-3(b)(1), unless the authorization is terminated or revoked.     Resp Syncytial Virus by PCR NEGATIVE NEGATIVE Final    Comment: (NOTE) Fact Sheet for Patients: EntrepreneurPulse.com.au  Fact Sheet for Healthcare Providers: IncredibleEmployment.be  This test is not yet approved or cleared by the [place] FDA and has been authorized for detection and/or diagnosis of SARS-CoV-2 by FDA under an Emergency Use Authorization (EUA). This EUA will remain in effect (meaning this test can be used) for the duration of the COVID-19 declaration under Section 564(b)(1) of the Act, 21 U.S.C. section 360bbb-3(b)(1), unless the authorization is terminated or revoked.  Performed at [facility], [place] [address], [place], [place] [address]   Respiratory (~20 pathogens) panel by PCR     Status: None   Collection Time: 05/20/22  1:48 PM   Specimen: Nasopharyngeal Swab; Respiratory  Result Value Ref Range Status   Adenovirus NOT DETECTED NOT DETECTED Final   Coronavirus 229E NOT DETECTED NOT DETECTED Final    Comment: (NOTE) The Coronavirus on the Respiratory Panel, DOES NOT test for the novel  Coronavirus (2019 nCoV)    Coronavirus HKU1 NOT DETECTED NOT DETECTED Final   Coronavirus NL63 NOT DETECTED NOT DETECTED Final   Coronavirus OC43 NOT DETECTED NOT DETECTED Final   Metapneumovirus NOT DETECTED NOT DETECTED Final   Rhinovirus / Enterovirus NOT DETECTED NOT DETECTED Final   Influenza Ruffin L[place] NOT DETECTED NOT DETECTED Final   Influenza B NOT DETECTED NOT DETECTED Final   Parainfluenza Virus 1 NOT DETECTED NOT DETECTED Final   Parainfluenza Virus 2 NOT DETECTED NOT DETECTED Final   Parainfluenza Virus 3 NOT DETECTED NOT DETECTED Final    Parainfluenza Virus 4 NOT DETECTED NOT DETECTED Final   Respiratory Syncytial Virus NOT DETECTED NOT DETECTED Final   Bordetella pertussis NOT DETECTED NOT DETECTED Final   Bordetella Parapertussis NOT DETECTED NOT DETECTED Final   Chlamydophila pneumoniae NOT DETECTED NOT DETECTED Final   Mycoplasma pneumoniae NOT DETECTED NOT DETECTED Final    Comment: Performed at [facility], [place]. [address] [place], [place] [address]  Culture, blood (routine x 2) Call MD if unable to obtain prior to antibiotics being given     Status: None (Preliminary result)   Collection Time: 05/20/22 10:38 PM   Specimen: BLOOD RIGHT HAND  Result Value Ref Range Status   Specimen Description BLOOD RIGHT HAND  Final   Special Requests   Final    BOTTLES DRAWN AEROBIC AND ANAEROBIC Blood Culture results may not be optimal due to an inadequate volume of blood received in culture bottles   Culture   Final    NO GROWTH 4 DAYS Performed at [facility], [place] [address], [place], [place] [address]    Report Status PENDING  Incomplete  Culture, blood (routine x 2) Call MD if unable to obtain prior to antibiotics being given     Status: None (Preliminary result)   Collection Time: 05/20/22 10:38 PM   Specimen: BLOOD LEFT HAND  Result Value Ref Range Status   Specimen Description BLOOD LEFT HAND  Final   Special Requests   Final    BOTTLES DRAWN AEROBIC AND ANAEROBIC Blood Culture results may not be optimal due to an inadequate volume of blood  received in culture bottles   Culture   Final    NO GROWTH 4 DAYS Performed at [facility], [place] [address], [place], [place] [address]    Report Status PENDING  Incomplete         Radiology Studies: MR BRAIN WO CONTRAST  Result Date: 05/22/2022 CLINICAL DATA:  Altered mental status EXAM: MRI HEAD WITHOUT CONTRAST TECHNIQUE: Multiplanar, multiecho pulse sequences of the brain and surrounding structures were obtained without intravenous contrast.  COMPARISON:  None Available. FINDINGS: Brain: No acute infarct, mass effect or extra-axial collection. No chronic microhemorrhage or siderosis. There is multifocal hyperintense T2-weighted signal within the white matter. Generalized volume loss. The midline structures are normal. Vascular: Normal flow voids. Skull and upper cervical spine: Normal marrow signal. Sinuses/Orbits: Paranasal sinuses are clear. There is bilateral mastoid fluid. Bilateral ocular lens replacements. Other: None. IMPRESSION: 1. No acute intracranial abnormality. 2. Findings of chronic small vessel ischemia and volume loss. Electronically Signed   By: Ulyses Jarred M.D.   On: 05/22/2022 22:23   DG Abd Portable 1V  Result Date: 05/22/2022 CLINICAL DATA:  Encounter for imaging to screen for metal prior to MRI. EXAM: PORTABLE ABDOMEN - 1 VIEW COMPARISON:  CT 05/20/2022 FINDINGS: Posterior rod with intrapedicular screw fusion and interbody spacers from L2 through L4. Right hip arthroplasty. Cholecystectomy clips in the right upper quadrant. Vertebral augmentation within L1 and T11. Normal bowel gas pattern. Vascular calcifications. IMPRESSION: 1. Surgical hardware in the lumbar spine with right hip arthroplasty. 2. Right upper quadrant cholecystectomy clips. 3. L1 and T11 vertebral plasty. 4. No MRI contraindications. Electronically Signed   By: Keith Rake M.D.   On: 05/22/2022 20:59        Scheduled Meds:  allopurinol  100 mg Oral Daily   amiodarone  200 mg Oral Daily   atorvastatin  80 mg Oral QHS   Chlorhexidine Gluconate Cloth  6 each Topical Daily   escitalopram  10 mg Oral Daily   fentaNYL  1 patch Transdermal Q72H   loratadine  10 mg Oral Daily   metoprolol tartrate  12.5 mg Oral BID   [START ON 05/25/2022] pantoprazole  40 mg Intravenous Q12H   [START ON 05/25/2022] predniSONE  20 mg Oral Q breakfast   Followed by   Derrill Memo ON 05/26/2022] predniSONE  10 mg Oral Q breakfast   QUEtiapine  25 mg Oral QHS   sodium  phosphate  1 enema Rectal Once   thiamine (VITAMIN B1) injection  100 mg Intravenous Daily   Continuous Infusions:  azithromycin 500 mg (05/23/22 1957)   cefTRIAXone (ROCEPHIN)  IV 2 g (05/23/22 1535)   pantoprazole 8 mg/hr (05/24/22 0001)     LOS: 4 days    Time spent: over 30 min    Fayrene Helper, MD [facility]   To contact the attending provider between 7A-7P or the covering provider during after hours 7P-7A, please log into the web site [email or web address] and access using universal [facility] password for that web site. If you do not have the password, please call the hospital operator.  05/24/2022, 12:58 PM

## 2022-05-24 NOTE — Evaluation (Signed)
Physical Therapy Evaluation Patient Details Name: Betty Jackson MRN: [id number] DOB: 11-Sep-1934 Today's Date: 05/24/2022  History of Present Illness  Pt is 88 year old presented to [facility] on  05/20/22 with confusion and hypoxia. Pt found to be septic due to community acquired PNA. PMH - CVA, ckd, chronic pain, temporal arteritis, first degree AV block, HFpEF, RA, HTN, Afib, CAD, rt hip hemiarthroplasty, lumbar fusion  Clinical Impression  Pt present from her long term care facility requiring incr assistance for her limited mobility. Will follow acutely to maximize mobility and decr burden of care. Recommend return to long term care at prior level        Recommendations for follow up therapy are one component of a multi-disciplinary discharge planning process, led by the attending physician.  Recommendations may be updated based on patient status, additional functional criteria and insurance authorization.  Follow Up Recommendations Long-term institutional care without follow-up therapy Can patient physically be transported by private vehicle: No    Assistance Recommended at Discharge Frequent or constant Supervision/Assistance  Patient can return home with the following  A lot of help with walking and/or transfers;A lot of help with bathing/dressing/bathroom    Equipment Recommendations None recommended by PT  Recommendations for Other Services       Functional Status Assessment Patient has had a recent decline in their functional status and/or demonstrates limited ability to make significant improvements in function in a reasonable and predictable amount of time     Precautions / Restrictions Precautions Precautions: Fall Restrictions Weight Bearing Restrictions: No      Mobility  Bed Mobility Overal bed mobility: Needs Assistance Bed Mobility: Supine to Sit     Supine to sit: Mod assist, HOB elevated, +2 for safety/equipment, Max assist     General bed mobility comments:  Assist to elevate trunk into sitting and bring hips to EOB    Transfers Overall transfer level: Needs assistance Equipment used: Rolling walker (2 wheels) Transfers: Sit to/from Stand, Bed to chair/wheelchair/BSC Sit to Stand: +2 physical assistance, Mod assist, +2 safety/equipment   Step pivot transfers: +2 physical assistance, Mod assist       General transfer comment: Initially +2 mod assist to stand from bed with walker to bring hips up and for stability. Incr time to rise and cues to stand fully erect. Bed to chair with walker with +2 mod assist for support and pt with great difficulty stepping feet around to chair. Stood from chair for repositioning with mod assist.    Ambulation/Gait               General Gait Details: Unable  Stairs            Wheelchair Mobility    Modified Rankin (Stroke Patients Only)       Balance Overall balance assessment: Needs assistance Sitting-balance support: No upper extremity supported, Feet supported Sitting balance-[name]: Fair Sitting balance - Comments: Loses balance posteriorly with dynamic activity Postural control: Posterior lean Standing balance support: Bilateral upper extremity supported, During functional activity, Reliant on assistive device for balance Standing balance-[name]: Poor Standing balance comment: walker and min assist for static standing                             Pertinent Vitals/Pain Pain Assessment Pain Assessment: No/denies pain    Home Living Family/patient expects to be discharged to:: Skilled nursing facility (long term care)  Home Equipment: Wheelchair - manual Additional Comments: does not wear O2 baseline    Prior Function Prior Level of Function : Needs assist             Mobility Comments: w/c primarily ADLs Comments: staff assists with ADLs/IADLs     Hand Dominance   Dominant Hand: Right    Extremity/Trunk Assessment    Upper Extremity Assessment Upper Extremity Assessment: Defer to OT evaluation    Lower Extremity Assessment Lower Extremity Assessment: Generalized weakness       Communication   Communication: HOH  Cognition Arousal/Alertness: Awake/alert Behavior During Therapy: Flat affect Overall Cognitive Status: No family/caregiver present to determine baseline cognitive functioning                                 General Comments: Pt alert and following 1 step commands. Slow processing.        General Comments General comments (skin integrity, edema, etc.): Pt on 2L O2 with SpO2 mid 90's. Removed O2 and possible drop in SpO2 to mid 80's vs poor reading. Replaced O2 at 2L with SpO2 again in mid 90's.    Exercises     Assessment/Plan    PT Assessment Patient needs continued PT services  PT Problem List Decreased strength;Decreased activity tolerance;Decreased balance;Decreased mobility       PT Treatment Interventions DME instruction;Gait training;Functional mobility training;Therapeutic activities;Therapeutic exercise;Balance training;Patient/family education    PT Goals (Current goals can be found in the Care Plan section)  Acute Rehab PT Goals Patient Stated Goal: not stated PT Goal Formulation: With patient Time For Goal Achievement: 06/07/22 Potential to Achieve Goals: Fair    Frequency Min 2X/week     Co-evaluation PT/OT/SLP Co-Evaluation/Treatment: Yes Reason for Co-Treatment: For patient/therapist safety PT goals addressed during session: Mobility/safety with mobility;Balance;Proper use of DME         AM-PAC PT "6 Clicks" Mobility  Outcome Measure Help needed turning from your back to your side while in a flat bed without using bedrails?: A Lot Help needed moving from lying on your back to sitting on the side of a flat bed without using bedrails?: A Lot Help needed moving to and from a bed to a chair (including a wheelchair)?: Total Help needed  standing up from a chair using your arms (e.g., wheelchair or bedside chair)?: A Lot Help needed to walk in hospital room?: Total Help needed climbing 3-5 steps with a railing? : Total 6 Click Score: 9    End of Session Equipment Utilized During Treatment: Gait belt;Oxygen Activity Tolerance: Patient limited by fatigue Patient left: in chair;with call bell/phone within reach;with chair alarm set Nurse Communication: Mobility status;Need for lift equipment PT Visit Diagnosis: Other abnormalities of gait and mobility (R26.89);Muscle weakness (generalized) (M62.81)    Time: 1405-1430 PT Time Calculation (min) (ACUTE ONLY): 25 min   Charges:   PT Evaluation $PT Eval Moderate Complexity: 1 [place] [employer] 05/24/2022, 3:14 PM

## 2022-05-24 NOTE — Evaluation (Signed)
Occupational Therapy Evaluation Patient Details Name: Betty Jackson MRN: [id number] DOB: 12-May-1934 Today's Date: 05/24/2022   History of Present Illness Pt is 88 year old presented to [facility] on  05/20/22 with confusion and hypoxia. Pt found to be septic due to community acquired PNA. PMH - CVA, ckd, chronic pain, temporal arteritis, first degree AV block, HFpEF, RA, HTN, Afib, CAD, rt hip hemiarthroplasty, lumbar fusion   Clinical Impression   Pt reports use of w/c and RW at baseline for mobility, staff assists with ADLs/IADLs. Pt currently on 2L O2, desatting to mid 80s on RA, O2 reapplied and pt back in 90's throughout session. Pt needing min-max A for ADLs, mod A for bed mobility,and mod A +2 for pivot transfer to chair. Pt presenting with impairments listed below, will follow acutely. Recommend SNF at d/c.      Recommendations for follow up therapy are one component of a multi-disciplinary discharge planning process, led by the attending physician.  Recommendations may be updated based on patient status, additional functional criteria and insurance authorization.   Follow Up Recommendations  Skilled nursing-short term rehab (<3 hours/day)     Assistance Recommended at Discharge Frequent or constant Supervision/Assistance  Patient can return home with the following A lot of help with walking and/or transfers;A lot of help with bathing/dressing/bathroom;Assistance with cooking/housework;Direct supervision/assist for medications management;Direct supervision/assist for financial management;Assist for transportation;Help with stairs or ramp for entrance    Functional Status Assessment  Patient has had a recent decline in their functional status and demonstrates the ability to make significant improvements in function in a reasonable and predictable amount of time.  Equipment Recommendations  Other (comment) (defer)    Recommendations for Other Services PT consult     Precautions /  Restrictions Precautions Precautions: Fall Restrictions Weight Bearing Restrictions: No      Mobility Bed Mobility Overal bed mobility: Needs Assistance Bed Mobility: Supine to Sit     Supine to sit: Mod assist, HOB elevated, +2 for safety/equipment, Max assist     General bed mobility comments: Assist to elevate trunk into sitting and bring hips to EOB    Transfers Overall transfer level: Needs assistance Equipment used: Rolling walker (2 wheels) Transfers: Sit to/from Stand, Bed to chair/wheelchair/BSC Sit to Stand: +2 physical assistance, Mod assist, +2 safety/equipment     Step pivot transfers: +2 physical assistance, Mod assist     General transfer comment: Initially +2 mod assist to stand from bed with walker to bring hips up and for stability. Incr time to rise and cues to stand fully erect. Bed to chair with walker with +2 mod assist for support and pt with great difficulty stepping feet around to chair. Stood from chair for repositioning with mod assist.      Balance Overall balance assessment: Needs assistance Sitting-balance support: No upper extremity supported, Feet supported Sitting balance-[name]: Fair Sitting balance - Comments: Loses balance posteriorly with dynamic activity Postural control: Posterior lean Standing balance support: Bilateral upper extremity supported, During functional activity, Reliant on assistive device for balance Standing balance-[name]: Poor Standing balance comment: walker and min assist for static standing                           ADL either performed or assessed with clinical judgement   ADL Overall ADL's : Needs assistance/impaired Eating/Feeding: Set up   Grooming: Minimal assistance;Sitting   Upper Body Bathing: Moderate assistance   Lower Body Bathing:  Maximal assistance   Upper Body Dressing : Moderate assistance   Lower Body Dressing: Maximal assistance   Toilet Transfer: +2 for physical  assistance;Moderate assistance   Toileting- Clothing Manipulation and Hygiene: Maximal assistance       Functional mobility during ADLs: Moderate assistance;+2 for physical assistance;Rolling walker (2 wheels)       Vision   Vision Assessment?: No apparent visual deficits     Perception Perception Perception Tested?: No   Praxis Praxis Praxis tested?: Not tested    Pertinent Vitals/Pain Pain Assessment Pain Assessment: No/denies pain     Hand Dominance Right   Extremity/Trunk Assessment Upper Extremity Assessment Upper Extremity Assessment: Generalized weakness (crepitus noted in bilateral shoulders)   Lower Extremity Assessment Lower Extremity Assessment: Defer to PT evaluation   Cervical / Trunk Assessment Cervical / Trunk Assessment: Normal   Communication Communication Communication: HOH   Cognition Arousal/Alertness: Awake/alert Behavior During Therapy: Flat affect Overall Cognitive Status: No family/caregiver present to determine baseline cognitive functioning                                 General Comments: Pt alert and following 1 step commands. Slow processing.     General Comments  SpO2 dropping to mid 80s on RA, 2L O2 and SpO2 in 90s    Exercises     Shoulder Instructions      Home Living Family/patient expects to be discharged to:: Assisted living                             Home Equipment: Wheelchair - manual   Additional Comments: does not wear O2 baseline      Prior Functioning/Environment Prior Level of Function : Needs assist             Mobility Comments: w/c primarily ADLs Comments: staff assists with ADLs/IADLs        OT Problem List: Decreased strength;Decreased range of motion;Decreased activity tolerance;Impaired balance (sitting and/or standing);Decreased safety awareness;Cardiopulmonary status limiting activity      OT Treatment/Interventions: Self-care/ADL training;Therapeutic  exercise;Energy conservation;DME and/or AE instruction;Therapeutic activities;Patient/family education;Balance training    OT Goals(Current goals can be found in the care plan section) Acute Rehab OT Goals Patient Stated Goal: none stated OT Goal Formulation: With patient Time For Goal Achievement: 06/07/22 Potential to Achieve Goals: Good ADL Goals Pt Will Perform Upper Body Dressing: sitting;with min guard assist Pt Will Perform Lower Body Dressing: with min assist;sitting/lateral leans;sit to/from stand Pt Will Transfer to Toilet: with min assist;ambulating;regular height toilet Pt Will Perform Tub/Shower Transfer: Shower transfer;with mod assist;ambulating;shower seat Additional ADL Goal #1: pt will be supervision for bed mobility in prep for ADLs  OT Frequency: Min 2X/week    Co-evaluation PT/OT/SLP Co-Evaluation/Treatment: Yes Reason for Co-Treatment: For patient/therapist safety PT goals addressed during session: Mobility/safety with mobility;Balance;Proper use of DME OT goals addressed during session: ADL's and self-care      AM-PAC OT "6 Clicks" Daily Activity     Outcome Measure Help from another person eating meals?: None Help from another person taking care of personal grooming?: A Little Help from another person toileting, which includes using toliet, bedpan, or urinal?: A Lot Help from another person bathing (including washing, rinsing, drying)?: A Lot Help from another person to put on and taking off regular upper body clothing?: A Lot Help from another person to put on and taking  off regular lower body clothing?: A Lot 6 Click Score: 15   End of Session Equipment Utilized During Treatment: Gait belt;Rolling walker (2 wheels);Oxygen (2L) Nurse Communication: Mobility status  Activity Tolerance: Patient tolerated treatment well Patient left: with call bell/phone within reach;in chair;Other (comment) (with PT)  OT Visit Diagnosis: Unsteadiness on feet (R26.81);Other  abnormalities of gait and mobility (R26.89);Muscle weakness (generalized) (M62.81)                Time: [id number] OT Time Calculation (min): 28 min Charges:  OT General Charges $OT Visit: 1 Visit OT Evaluation $OT Eval Moderate Complexity: 1 Mod  Mack Alvidrez K, OTD, OTR/L [employer] Preferred Acute Rehab [phone]  Betty Jackson 05/24/2022, 4:05 PM

## 2022-05-24 NOTE — Progress Notes (Signed)
Daily Progress Note   Patient Name: Betty Jackson       Date: 05/24/2022 DOB: 05/01/1934  Age: 88 y.o. MRN#: [id number] Attending Physician: Elodia Florence., * Primary Care Physician: Alvester Morin, MD Admit Date: 05/20/2022  Reason for Consultation/Follow-up: Establishing goals of care  Subjective: I have reviewed medical records including EPIC notes and labs. Received report from primary RN - no acute concerns.  Went to visit patient at bedside - daughter/Melanie was present.  Patient was lying in bed awake, alert, and able to participate in conversation. No signs or non-verbal gestures of pain or discomfort noted. No respiratory distress, increased work of breathing, or secretions noted.  Patient tells me she "is feeling well."  She reports 0 out of 10 pain; though does endorse feeling sleepy - she is ready for a nap.  Emotional support provided to family.  Reviewed with Threasa Beards conversation/discussion had with her sister Melissa yesterday regarding discharging back to [facility] with outpatient palliative care.  Answered questions regarding what outpatient palliative care is and their role in supporting patient/family after discharge per her request.  All questions and concerns addressed. Encouraged to call with questions and/or concerns. PMT card previously provided.  Length of Stay: 4  Current Medications: Scheduled Meds:   allopurinol  100 mg Oral Daily   amiodarone  200 mg Oral Daily   atorvastatin  80 mg Oral QHS   Chlorhexidine Gluconate Cloth  6 each Topical Daily   escitalopram  10 mg Oral Daily   fentaNYL  1 patch Transdermal Q72H   loratadine  10 mg Oral Daily   metoprolol tartrate  12.5 mg Oral BID   [START ON 05/25/2022] pantoprazole  40 mg Intravenous Q12H    [START ON 05/25/2022] predniSONE  20 mg Oral Q breakfast   Followed by   Derrill Memo ON 05/26/2022] predniSONE  10 mg Oral Q breakfast   QUEtiapine  25 mg Oral QHS   sodium phosphate  1 enema Rectal Once   thiamine (VITAMIN B1) injection  100 mg Intravenous Daily    Continuous Infusions:  azithromycin 500 mg (05/23/22 1957)   cefTRIAXone (ROCEPHIN)  IV 2 g (05/23/22 1535)   pantoprazole 8 mg/hr (05/24/22 0001)    PRN Meds: acetaminophen **OR** acetaminophen, albuterol, fentaNYL (SUBLIMAZE) injection, hydrocortisone  Physical Exam Vitals and nursing note  reviewed.  Constitutional:      General: She is not in acute distress. Pulmonary:     Effort: No respiratory distress.  Skin:    General: Skin is warm and dry.  Neurological:     Mental Status: She is alert.     Motor: Weakness present.  Psychiatric:        Attention and Perception: Attention normal.        Behavior: Behavior is cooperative.             Vital Signs: BP (!) 163/58   Pulse 64   Temp (!) 97.3 F (36.3 C) (Oral)   Resp 19   Ht '5\' 4"'$  (1.626 m)   Wt 83.1 kg   SpO2 95%   BMI 31.45 kg/m  SpO2: SpO2: 95 % O2 Device: O2 Device: Nasal Cannula O2 Flow Rate: O2 Flow Rate (L/min): 2 L/min  Intake/output summary:  Intake/Output Summary (Last 24 hours) at 05/24/2022 1337 Last data filed at 05/24/2022 1200 Gross per 24 hour  Intake 2083.1 ml  Output 500 ml  Net 1583.1 ml   LBM: Last BM Date : 05/23/22 Baseline Weight: Weight: 78 kg Most recent weight: Weight: 83.1 kg       Palliative Assessment/Data: PPS 30%      Patient Active Problem List   Diagnosis Date Noted   Gastritis and gastroduodenitis 05/23/2022   Pneumonia 05/20/2022   Urinary tract infection without hematuria    Bleeding hemorrhoids    Rectal ulcer    Hematochezia    Odynophagia    Esophagitis    Lung nodules 09/04/2021   Sepsis secondary to UTI ([place]) 09/03/2021   Atrial fibrillation with RVR ([place]) 08/06/2021   Dysphagia [id number]    Acute metabolic encephalopathy [id number]   Hypoxemia 03/15/2021   Acute kidney injury superimposed on chronic kidney disease ([place]) 03/15/2021   Stroke-like symptoms 07/14/2020   Cough 05/24/2020   Dyspnea 05/24/2020   Optic neuritis 05/24/2020   Osteopenia 05/24/2020   Rash 05/24/2020   Vitamin D deficiency 05/24/2020   Palliative care by specialist    Goals of care, counseling/discussion    DNR (do not resuscitate)    Weakness    Advanced directives, counseling/discussion    Pressure injury of skin 10/18/2019   Closed fracture of right distal femur ([place]) 10/17/2019   Femur fracture, right (HCC) 09/25/2019   Chronic diastolic CHF (congestive heart failure) ([place]) 09/25/2019   HCAP (healthcare-associated pneumonia) 06/19/2019   Encephalopathy 06/19/2019   Lumbar degenerative disc disease    Paroxysmal A-fib (HCC)    Hypomagnesemia    C. difficile colitis    Diarrhea 05/29/2019   CHF (congestive heart failure) ([place]) 05/29/2019   Recurrent falls 04/30/2019   Pericardial effusion without cardiac tamponade 03/29/2019   Hypoalbuminemia 03/27/2019   Cardiac tamponade    Pericarditis 03/06/2019   Fever    Septic shock (HCC)    Atypical chest pain    Atrial fibrillation with rapid ventricular response (HCC)    Nausea and vomiting 01/17/2019   Acute diverticulitis 01/13/2019   Chronic back pain 01/13/2019   Tachycardia 01/13/2019   Hypokalemia 01/13/2019   Chronic low back pain 07/10/2018   Influenza A 05/20/2017   Severe sepsis ([place]) 05/20/2017   Acute respiratory failure with hypoxia (HCC) 05/20/2017   CKD (chronic kidney disease), stage III ([place]) 05/20/2017   PAF (paroxysmal atrial fibrillation) ([place]) 05/20/2017   HTN (hypertension) 05/20/2017   Giant cell arteritis ([place]) 05/20/2017   Coronary disease 05/20/2017  Abnormal urinalysis 05/20/2017   Osteoporosis 05/08/2016   Herniated nucleus pulposus, L5-S1, right 11/04/2015   Major depressive disorder, recurrent  episode, moderate ([place]) 07/16/2013   Abdominal pain, epigastric 06/22/2013   Lumbar stenosis 04/25/2013   Chronic insomnia 02/07/2013   Elevated transaminase level 02/07/2013   CVA (cerebral infarction) 10/29/2012   Visual changes 10/28/2012   Depression 10/28/2012   Anxiety state 10/28/2012   Acute on chronic anemia 10/28/2012   Nonspecific (abnormal) findings on radiological and other examination of gastrointestinal tract 01/25/2012   Hyponatremia 01/24/2012   Hematuria 01/24/2012   Enteritis 12/24/2011   Abdominal pain, other specified site 12/24/2011   Leg pain, right 02/18/2011   Rheumatoid arthritis ([place]) 02/04/2011   Temporal arteritis ([place]) 09/01/2010   Hypertension 09/01/2010   Hyperlipidemia 09/01/2010   History of atrial fibrillation 09/01/2010   CAD (coronary artery disease) 09/01/2010   GERD (gastroesophageal reflux disease) 09/01/2010   Insomnia 09/01/2010    Palliative Care Assessment & Plan   Patient Profile: 88 y.o. female  with past medical history of chronic pain on chronic opiates, A-fib on Eliquis, RA and temporal arteritis on chronic steroids, CAD, CKD 3, CVA presented to ED on 05/20/2022 [facility] long-term care facility with complaints of chest pain and nausea/vomiting.  Patient was admitted on 05/20/2022 with acute hypercarbic and hypoxic respiratory failure, community-acquired pneumonia, sepsis, acute metabolic encephalopathy, AKI/dehydration/hypernatremia, nausea/vomiting/chest pain/abdominal discomfort, fecal impaction.    Assessment: Principal Problem:   Pneumonia Active Problems:   Gastritis and gastroduodenitis   Recommendations/Plan: Continue to treat the treatable Continue DNR/DNI as previously documented - durable DNR form found in shadow chart.  Copy was made and will be scanned into Vynca/ACP tab Continue current pain regimen - patient's pain is well managed at this time  No narcan. In the event of decline, family open for continued  discussion around transition to full comfort  Goal is for patient's discharge back to [facility] with outpatient Palliative Care to follow [facility] previously consulted for: outpatient Palliative Care referral, requesting [facility] PMT will continue to follow peripherally. If there are any imminent needs please call the service directly  Goals of Care and Additional Recommendations: Limitations on Scope of Treatment: Full Scope Treatment  Code Status:    Code Status Orders  (From admission, onward)           Start     Ordered   05/20/22 1914  Do not attempt resuscitation (DNR)  Continuous       Question Answer Comment  If patient has no pulse and is not breathing Do Not Attempt Resuscitation   If patient has a pulse and/or is breathing: Medical Treatment Goals LIMITED ADDITIONAL INTERVENTIONS: Use medication/IV fluids and cardiac monitoring as indicated; Do not use intubation or mechanical ventilation (DNI), also provide comfort medications.  Transfer to Progressive/Stepdown as indicated, avoid Intensive Care.   Consent: Discussion documented in EHR or advanced directives reviewed      05/20/22 1914           Code Status History     Date Active Date Inactive Code Status Order ID Comments User Context   05/20/2022 1258 05/20/2022 1914 DNR [id number]  Fransico Meadow, MD ED   09/04/2021 0036 09/11/2021 1756 DNR [id number]  Shela Leff, MD ED   08/05/2021 1801 08/11/2021 1821 DNR [id number]  Guilford Shi, MD Inpatient   08/05/2021 1424 08/05/2021 1455 DNR [id number]  Guilford Shi, MD ED   03/15/2021 0104 03/19/2021 2205 DNR [id number]  Chotiner, Leory Plowman  S, MD ED   07/14/2020 1453 07/15/2020 0025 DNR [id number]  Karmen Bongo, MD ED   10/25/2019 2013 10/30/2019 2020 DNR [id number]  Bonnielee Haff, MD ED   10/17/2019 0310 10/23/2019 2327 DNR [id number]  Etta Quill, DO ED   09/25/2019 2029 09/30/2019 0331 DNR [id number]  Toy Baker, MD Inpatient   06/19/2019 2346  06/28/2019 0212 DNR [id number]  Shela Leff, MD ED   06/19/2019 2346 06/19/2019 2346 Full Code [id number]  Shela Leff, MD ED   05/29/2019 0111 06/01/2019 2315 DNR [id number]  Shela Leff, MD ED   04/15/2019 1608 04/21/2019 1833 DNR [id number]  Lequita Halt, MD ED   03/09/2019 0828 03/13/2019 1649 DNR [id number]  Cristal Generous, NP Inpatient   03/08/2019 1225 03/09/2019 0828 Full Code [id number]  Ivin Poot, MD Inpatient   03/06/2019 1802 03/08/2019 1224 DNR [id number]  Candee Furbish, MD Inpatient   03/06/2019 1757 03/06/2019 1802 Full Code [id number]  Candee Furbish, MD Inpatient   01/13/2019 1734 01/20/2019 2138 DNR [id number]  Guilford Shi, MD ED   01/13/2019 1634 01/13/2019 1734 Full Code [id number]  Guilford Shi, MD ED   05/20/2017 1542 05/26/2017 2033 DNR [id number]  Samella Parr, NP ED   11/04/2015 1910 11/05/2015 1658 Full Code [id number]  Kristeen Miss, MD Inpatient   04/25/2013 2250 05/01/2013 1923 Full Code [id number]  Kristeen Miss, MD Inpatient   10/28/2012 1850 10/30/2012 1701 Full Code [id number]  Eugenie Filler, MD Inpatient      Advance Directive Documentation    Flowsheet Row Most Recent Value  Type of Advance Directive Out of facility DNR (pink MOST or yellow form)  Pre-existing out of facility DNR order (yellow form or pink MOST form) --  "MOST" Form in Place? --       Prognosis:  Unable to determine  Discharge Planning: To Be Determined  Care plan was discussed with primary RN, patient, patient's daughter/HCPOA  Thank you for allowing the Palliative Medicine Team to assist in the care of this patient.  Lin Landsman, NP  Please contact Palliative Medicine Team phone at [phone] for questions and concerns.   *Portions of this note are a verbal dictation therefore any spelling and/or grammatical errors are due to the "[place] [employer]" system interpretation.

## 2022-05-24 NOTE — Progress Notes (Signed)
MD notified of patient abdomen looking and feeling bigger and bowel sounds more decreased. MD ordered xray of abdomen and saw patient.

## 2022-05-24 NOTE — Progress Notes (Deleted)
MD made aware of patient's very minimal urine output patient has a foley. Bladder scan less than 221m and hard to see due to abdomen very distended.

## 2022-05-25 ENCOUNTER — Encounter (HOSPITAL_COMMUNITY): Payer: Self-pay | Admitting: Gastroenterology

## 2022-05-25 DIAGNOSIS — J189 Pneumonia, unspecified organism: Secondary | ICD-10-CM | POA: Diagnosis not present

## 2022-05-25 LAB — COMPREHENSIVE METABOLIC PANEL
ALT: 17 U/L (ref 0–44)
AST: 31 U/L (ref 15–41)
Albumin: 2.8 g/dL — ABNORMAL LOW (ref 3.5–5.0)
Alkaline Phosphatase: 67 U/L (ref 38–126)
Anion gap: 7 (ref 5–15)
BUN: 21 mg/dL (ref 8–23)
CO2: 24 mmol/L (ref 22–32)
Calcium: 8.4 mg/dL — ABNORMAL LOW (ref 8.9–10.3)
Chloride: 112 mmol/L — ABNORMAL HIGH (ref 98–111)
Creatinine, Ser: 1.38 mg/dL — ABNORMAL HIGH (ref 0.44–1.00)
GFR, Estimated: 38 mL/min — ABNORMAL LOW (ref >60)
Glucose, Bld: 157 mg/dL — ABNORMAL HIGH (ref 70–99)
Potassium: 3.5 mmol/L (ref 3.5–5.1)
Sodium: 143 mmol/L (ref 135–145)
Total Bilirubin: 0.5 mg/dL (ref 0.3–1.2)
Total Protein: 5.4 g/dL — ABNORMAL LOW (ref 6.5–8.1)

## 2022-05-25 LAB — CULTURE, BLOOD (ROUTINE X 2)
Culture: NO GROWTH
Culture: NO GROWTH

## 2022-05-25 LAB — CBC WITH DIFFERENTIAL/PLATELET
Abs Immature Granulocytes: 0.5 10*3/uL — ABNORMAL HIGH (ref 0.00–0.07)
Basophils Absolute: 0.1 10*3/uL (ref 0.0–0.1)
Basophils Relative: 0 %
Eosinophils Absolute: 0 10*3/uL (ref 0.0–0.5)
Eosinophils Relative: 0 %
HCT: 33.5 % — ABNORMAL LOW (ref 36.0–46.0)
Hemoglobin: 10.5 g/dL — ABNORMAL LOW (ref 12.0–15.0)
Immature Granulocytes: 3 %
Lymphocytes Relative: 4 %
Lymphs Abs: 0.6 10*3/uL — ABNORMAL LOW (ref 0.7–4.0)
MCH: 31.2 pg (ref 26.0–34.0)
MCHC: 31.3 g/dL (ref 30.0–36.0)
MCV: 99.4 fL (ref 80.0–100.0)
Monocytes Absolute: 0.6 10*3/uL (ref 0.1–1.0)
Monocytes Relative: 4 %
Neutro Abs: 13.1 10*3/uL — ABNORMAL HIGH (ref 1.7–7.7)
Neutrophils Relative %: 89 %
Platelets: 188 10*3/uL (ref 150–400)
RBC: 3.37 MIL/uL — ABNORMAL LOW (ref 3.87–5.11)
RDW: 14.2 % (ref 11.5–15.5)
WBC: 14.9 10*3/uL — ABNORMAL HIGH (ref 4.0–10.5)
nRBC: 0.3 % — ABNORMAL HIGH (ref 0.0–0.2)

## 2022-05-25 LAB — PHOSPHORUS: Phosphorus: 2.4 mg/dL — ABNORMAL LOW (ref 2.5–4.6)

## 2022-05-25 LAB — MAGNESIUM: Magnesium: 2.2 mg/dL (ref 1.7–2.4)

## 2022-05-25 MED ORDER — HYDROCODONE-ACETAMINOPHEN 5-325 MG PO TABS
1.0000 | ORAL_TABLET | Freq: Every day | ORAL | Status: DC
  Administered 2022-05-25 – 2022-05-27 (×3): 1 via ORAL
  Filled 2022-05-25 (×3): qty 1

## 2022-05-25 MED ORDER — TRAZODONE HCL 50 MG PO TABS
50.0000 mg | ORAL_TABLET | ORAL | Status: AC
  Administered 2022-05-25: 50 mg via ORAL
  Filled 2022-05-25: qty 1

## 2022-05-25 MED ORDER — DILTIAZEM HCL ER COATED BEADS 120 MG PO CP24
120.0000 mg | ORAL_CAPSULE | Freq: Every evening | ORAL | Status: DC
  Administered 2022-05-25: 120 mg via ORAL
  Filled 2022-05-25: qty 1

## 2022-05-25 MED ORDER — PANTOPRAZOLE SODIUM 40 MG PO TBEC
40.0000 mg | DELAYED_RELEASE_TABLET | Freq: Two times a day (BID) | ORAL | Status: DC
  Administered 2022-05-25 – 2022-05-28 (×7): 40 mg via ORAL
  Filled 2022-05-25 (×7): qty 1

## 2022-05-25 NOTE — Progress Notes (Signed)
PROGRESS NOTE    Betty Jackson  [id number] DOB: 1934/11/03 DOA: 05/20/2022 PCP: Alvester Morin, MD  Chief Complaint  Patient presents with   Chest Pain   Emesis    Brief Narrative:   Betty Jackson is Betty Jackson 88 y.o. female with medical history significant of chronic pain on chronic opiates, atrial fib (on eliquis), RA and temporal arteritis (on chronic steroids) and other medical issues here with confusion.   She's been admitted with acute metabolic encephalopathy and hypoxia.  She has evidence of pneumonia on chest CT.  Currently being treated for acute hypoxic and hypercarbic respiratory failure due to pneumonia.  Also has encephalopathy related to respiratory failure with contribution from opiates.  Currently providing supportive care.  Complex situation as noted below and in prior notes.    Assessment & Plan:   Principal Problem:   Pneumonia Active Problems:   Gastritis and gastroduodenitis  Goals of care See prior notes for additional details.  She's had significant improvement over past few days.  Currently DNR/DNI.  Palliative care has been consulted.  Family understands the difficult nature of balancing their mother's opiates/pain control/encephalopathy/respiratory issues.  Will continue lower dose fentanyl patch for now with prn IV pushes for breakthrough pain.  No narcan, family concerned for her comfort.  Palliative care c/s, appreciate recs  Maroon Stool  Concern for GI bleeding PPI BID Holding heparin/anticoagulation Appreciate GI recs - EGD 2/24  mild gastritis, unlikely source of potential GI blood loss per GI (but possible).  Recommended considering CTA with additional overt bleeding.  Pending pathology.  Right now, unless hemodynamically significant bleed, would try to avoid this due to risk of AKI.   Acute Hypercarbic and Hypoxic Respiratory Failure  Due to pneumonia, opiates/respiratory depression, encephalopathy Treating pna with abx Most recent VBG shows  improved CO2, resolved respiratory acidosis.  Hold further bipap for now.  Consider use going forward based on overall clinical course.   Sepsis due to Community Acquired Pneumonia  CT chest abdomen pelvis with patchy density at both lung bases L>R (scarring vs pneumonia) 2/22 CXR with low lung volumes and bibasilar opacity Improved, currently on 2 L - wean as tolerated (chronic home 2 L prn).  Persistent leukocytosis (labs pending today) (sepsis/infection + steroids).  Encephalopathy.  Fits criteria for sepsis with fever, leukocytosis, tachycardia, tachypnea, encephalopathy.  Completed 5 days ceftriaxone/azithromycin Wheezing noted overnight, none today - prn nebs Blood cultures pending, sputum cultures if able MRSA PCR RVP negative.  Negative covid, influenza, RSV Given her chronic steroid use at home, we've transitioned to IV steroids for her acute infection -> wean given improvement and bleeding   Acute Metabolic Encephalopathy  Delirium  Multifactorial -> pneumonia, hypercarbic resp failure, dehydration/AKI/hypernatremia, opiates/sedation --- suspect related to infection + developing AKI + opiates (there was thought that her norco dose was increased, but this doesn't seem to be the case) Follow b12 (wnl), folate (wnl), B1 (pending), VBG (resolved respiratory acidosis), ammonia (pending collection - will hold off for now, not drawn, but she's improved), TSH (elevated - follow free T4 - mildly elevated -> repeat after acute infection ) Head CT without acute intracranial abnormality EEG negative for seizures No narcan going forward. MRI brain without acute findings.  Still waxing/waning - this has improved.    Acute Kidney Injury  Dehydration  Hypernatremia Suspect this represents volume depletion and dehydration in setting of her encephalopathy over the last few days Improving (labs pending today).   UA bland CT without hydro  Continue to trend   Nausea  Vomiting  Chest Pain   Abdominal Discomfort Suspect related to pneumonia Negative troponins Follow, seems improved   Atrial Fibrillation Chadsvasc 6 Anticoagulation on hold with concern for GI bleeding and continued dark stools Resume amiodarone and low dose metop Hold diltiazem for now due to bradycardia   Fecal Impaction Enema ordered by EDP, follow   Chronic Pain Reduced dose fentanyl patch, with IV fentanyl for breakthrough - continue this for now For issues sleeping, will start norco at night Hold meloxicam while unable to take PO Family concerned about holding meds and prior issues with withdrawal, delirium, precipitated RVR, etc   Rheumatoid Arthritis  Hx Temporal Arteritis Hold sulfasalazine Steroid taper   Gout Resume allopurinol  HLD  CAD lipitor Eliquis on hold  Insomnia Seroquel nightly Norco nightly Delirium precautions  Obesity Body mass index is 31.41 kg/m.    DVT prophylaxis: SCD Code Status: DNR/DNI Family Communication: daughter at bedside Disposition:   Status is: Inpatient Remains inpatient appropriate because: continued need for inpatient care   Consultants:  Palliative care  Procedures:  none  Antimicrobials:  Anti-infectives (From admission, onward)    Start     Dose/Rate Route Frequency Ordered Stop   05/21/22 2000  azithromycin (ZITHROMAX) 500 mg in sodium chloride 0.9 % 250 mL IVPB        500 mg 250 mL/hr over 60 Minutes Intravenous Every 24 hours 05/20/22 2223 05/26/22 1959   05/21/22 1700  cefTRIAXone (ROCEPHIN) 2 g in sodium chloride 0.9 % 100 mL IVPB        2 g 200 mL/hr over 30 Minutes Intravenous Every 24 hours 05/20/22 2223 05/28/22 1659   05/20/22 2230  cefTRIAXone (ROCEPHIN) 2 g in sodium chloride 0.9 % 100 mL IVPB  Status:  Discontinued        2 g 200 mL/hr over 30 Minutes Intravenous Every 24 hours 05/20/22 2218 05/20/22 2221   05/20/22 2230  azithromycin (ZITHROMAX) 500 mg in sodium chloride 0.9 % 250 mL IVPB  Status:  Discontinued         500 mg 250 mL/hr over 60 Minutes Intravenous Every 24 hours 05/20/22 2218 05/20/22 2222   05/20/22 1630  cefTRIAXone (ROCEPHIN) 2 g in sodium chloride 0.9 % 100 mL IVPB        2 g 200 mL/hr over 30 Minutes Intravenous  Once 05/20/22 1619 05/20/22 1754   05/20/22 1630  azithromycin (ZITHROMAX) 500 mg in sodium chloride 0.9 % 250 mL IVPB        500 mg 250 mL/hr over 60 Minutes Intravenous  Once 05/20/22 1619 05/20/22 2024       Subjective: Restless at night Issues sleeping Dark stool overnight  Objective: Vitals:   05/25/22 0227 05/25/22 0400 05/25/22 0500 05/25/22 0800  BP:  (!) 147/59  (!) 152/60  Pulse:  60  (!) 52  Resp:  17  14  Temp:  98 F (36.7 C)    TempSrc:  Oral  Oral  SpO2: 96% 96%  95%  Weight:   83 kg   Height:        Intake/Output Summary (Last 24 hours) at 05/25/2022 0920 Last data filed at 05/25/2022 0836 Gross per 24 hour  Intake 1160.95 ml  Output 700 ml  Net 460.95 ml    Filed Weights   05/22/22 0426 05/24/22 0500 05/25/22 0500  Weight: 82.3 kg 83.1 kg 83 kg    Examination:  General: No acute distress. Cardiovascular: rrr  Lungs: unlabored, no wheezing  Abdomen: Soft, nontender, nondistended Neurological: Alert and oriented 3. Moves all extremities 4 with equal strength. Cranial nerves II through XII grossly intact. Extremities: No clubbing or cyanosis. No edema.   Data Reviewed: I have personally reviewed following labs and imaging studies  CBC: Recent Labs  Lab 05/21/22 0600 05/21/22 0609 05/21/22 2116 05/22/22 0625 05/22/22 1025 05/22/22 1303 05/22/22 2106 05/23/22 0118 05/23/22 0608 05/23/22 1755 05/24/22 0830  WBC 28.5*  --  25.1* 26.8*  --   --   --   --  19.2*  --  14.3*  NEUTROABS  --   --   --  25.2*  --   --   --   --   --   --   --   HGB 11.8*   < > 9.9* 9.8* 11.1*   < > 9.6* 8.5* 8.4* 9.2* 8.7*  HCT 39.1   < > 32.9* 31.9* 34.8*  --   --   --  27.4* 29.9* 25.0*  MCV 104.0*  --  103.8* 102.6*  --   --   --    --  101.1*  --  103.7*  PLT 189  --  183 185  --   --   --   --  161  --  157   < > = values in this interval not displayed.    Basic Metabolic Panel: Recent Labs  Lab 05/21/22 0600 05/21/22 0609 05/21/22 1519 05/22/22 0625 05/23/22 0608 05/24/22 0241  NA 149* 150* 146* 142 144 145  K 3.8 3.8 4.6 4.1 3.9 3.9  CL 112*  --  111 111 114* 114*  CO2 22  --  25 25 21* 24  GLUCOSE 173*  --  281* 174* 107* 120*  BUN 31*  --  38* 42* 45* 37*  CREATININE 2.30*  --  2.58* 2.43* 2.24* 1.62*  CALCIUM 8.6*  --  7.9* 7.4* 7.2* 7.5*  MG  --   --   --  1.5* 1.5* 2.4  PHOS  --   --   --  3.3 2.9 2.3*    GFR: Estimated Creatinine Clearance: 26.5 mL/min (Bodin Gorka) (by C-G formula based on SCr of 1.62 mg/dL (H)).  Liver Function Tests: Recent Labs  Lab 05/20/22 1316 05/21/22 0600 05/22/22 0625 05/23/22 0608 05/24/22 0241  AST 24 41 '29 26 22  '$ ALT '14 19 16 15 15  '$ ALKPHOS 81 105 74 66 65  BILITOT 1.4* 1.0 0.5 0.4 0.6  PROT 6.5 5.7* 5.1* 4.7* 4.8*  ALBUMIN 3.4* 2.7* 2.4* 2.4* 2.4*    CBG: Recent Labs  Lab 05/21/22 0353 05/21/22 2026  GLUCAP 155* 217*     Recent Results (from the past 240 hour(s))  Resp panel by RT-PCR (RSV, Flu Victoriana Aziz&B, Covid) Anterior Nasal Swab     Status: None   Collection Time: 05/20/22 12:41 PM   Specimen: Anterior Nasal Swab  Result Value Ref Range Status   SARS Coronavirus 2 by RT PCR NEGATIVE NEGATIVE Final   Influenza Avik Leoni by PCR NEGATIVE NEGATIVE Final   Influenza B by PCR NEGATIVE NEGATIVE Final    Comment: (NOTE) The Xpert Xpress SARS-CoV-2/FLU/RSV plus assay is intended as an aid in the diagnosis of influenza from Nasopharyngeal swab specimens and should not be used as Kuulei Kleier sole basis for treatment. Nasal washings and aspirates are unacceptable for Xpert Xpress SARS-CoV-2/FLU/RSV testing.  Fact Sheet for Patients: EntrepreneurPulse.com.au  Fact Sheet for Healthcare Providers: IncredibleEmployment.be  This test is  not  yet approved or cleared by the [place] and has been authorized for detection and/or diagnosis of SARS-CoV-2 by FDA under an Emergency Use Authorization (EUA). This EUA will remain in effect (meaning this test can be used) for the duration of the COVID-19 declaration under Section 564(b)(1) of the Act, 21 U.S.C. section 360bbb-3(b)(1), unless the authorization is terminated or revoked.     Resp Syncytial Virus by PCR NEGATIVE NEGATIVE Final    Comment: (NOTE) Fact Sheet for Patients: EntrepreneurPulse.com.au  Fact Sheet for Healthcare Providers: IncredibleEmployment.be  This test is not yet approved or cleared by the [place] FDA and has been authorized for detection and/or diagnosis of SARS-CoV-2 by FDA under an Emergency Use Authorization (EUA). This EUA will remain in effect (meaning this test can be used) for the duration of the COVID-19 declaration under Section 564(b)(1) of the Act, 21 U.S.C. section 360bbb-3(b)(1), unless the authorization is terminated or revoked.  Performed at [facility], [address], [place], [place] [address]   Respiratory (~20 pathogens) panel by PCR     Status: None   Collection Time: 05/20/22  1:48 PM   Specimen: Nasopharyngeal Swab; Respiratory  Result Value Ref Range Status   Adenovirus NOT DETECTED NOT DETECTED Final   Coronavirus 229E NOT DETECTED NOT DETECTED Final    Comment: (NOTE) The Coronavirus on the Respiratory Panel, DOES NOT test for the novel  Coronavirus (2019 nCoV)    Coronavirus HKU1 NOT DETECTED NOT DETECTED Final   Coronavirus NL63 NOT DETECTED NOT DETECTED Final   Coronavirus OC43 NOT DETECTED NOT DETECTED Final   Metapneumovirus NOT DETECTED NOT DETECTED Final   Rhinovirus / Enterovirus NOT DETECTED NOT DETECTED Final   Influenza Dakia Schifano NOT DETECTED NOT DETECTED Final   Influenza B NOT DETECTED NOT DETECTED Final   Parainfluenza Virus 1 NOT DETECTED NOT DETECTED  Final   Parainfluenza Virus 2 NOT DETECTED NOT DETECTED Final   Parainfluenza Virus 3 NOT DETECTED NOT DETECTED Final   Parainfluenza Virus 4 NOT DETECTED NOT DETECTED Final   Respiratory Syncytial Virus NOT DETECTED NOT DETECTED Final   Bordetella pertussis NOT DETECTED NOT DETECTED Final   Bordetella Parapertussis NOT DETECTED NOT DETECTED Final   Chlamydophila pneumoniae NOT DETECTED NOT DETECTED Final   Mycoplasma pneumoniae NOT DETECTED NOT DETECTED Final    Comment: Performed at [facility], [place], [place], [place] [address]  Culture, blood (routine x 2) Call MD if unable to obtain prior to antibiotics being given     Status: None (Preliminary result)   Collection Time: 05/20/22 10:38 PM   Specimen: BLOOD RIGHT HAND  Result Value Ref Range Status   Specimen Description BLOOD RIGHT HAND  Final   Special Requests   Final    BOTTLES DRAWN AEROBIC AND ANAEROBIC Blood Culture results may not be optimal due to an inadequate volume of blood received in culture bottles   Culture   Final    NO GROWTH 4 DAYS Performed at [facility], [place] [address], [place], [place] [address]    Report Status PENDING  Incomplete  Culture, blood (routine x 2) Call MD if unable to obtain prior to antibiotics being given     Status: None (Preliminary result)   Collection Time: 05/20/22 10:38 PM   Specimen: BLOOD LEFT HAND  Result Value Ref Range Status   Specimen Description BLOOD LEFT HAND  Final   Special Requests   Final    BOTTLES  DRAWN AEROBIC AND ANAEROBIC Blood Culture results may not be optimal due to an inadequate volume of blood received in culture bottles   Culture   Final    NO GROWTH 4 DAYS Performed at [facility] [address], [place], [place] [address]    Report Status PENDING  Incomplete         Radiology Studies: No results found.      Scheduled Meds:  allopurinol  100 mg Oral Daily   amiodarone  200 mg Oral Daily   atorvastatin  80 mg Oral  QHS   Chlorhexidine Gluconate Cloth  6 each Topical Daily   escitalopram  10 mg Oral Daily   fentaNYL  1 patch Transdermal Q72H   loratadine  10 mg Oral Daily   metoprolol tartrate  12.5 mg Oral BID   pantoprazole  40 mg Oral BID   [START ON 05/26/2022] predniSONE  10 mg Oral Q breakfast   QUEtiapine  25 mg Oral QHS   sodium phosphate  1 enema Rectal Once   thiamine (VITAMIN B1) injection  100 mg Intravenous Daily   Continuous Infusions:  azithromycin 250 mL/hr at 05/25/22 0836   cefTRIAXone (ROCEPHIN)  IV 200 mL/hr at 05/25/22 0836     LOS: 5 days    Time spent: over 30 min    Fayrene Helper, MD [facility]   To contact the attending provider between 7A-7P or the covering provider during after hours 7P-7A, please log into the web site [email or web address] and access using universal Magnet password for that web site. If you do not have the password, please call the hospital operator.  05/25/2022, 9:20 AM

## 2022-05-25 NOTE — Progress Notes (Signed)
Pt c/o restlessness, unable to sleep per daughter.  Dr. Nevada Crane, hospitalist on call, notified.  MD to bedside, also discussed pt high BPs.  Orders given.

## 2022-05-25 NOTE — Progress Notes (Signed)
   Medical records reviewed including progress notes, labs, imaging.  Goals of care are clear for return to long-term care facility with outpatient palliative care follow-up.    Patient last received a dose of IV fentanyl for breakthrough pain at 22:25 yesterday evening.  If she were to decline, patient's family would be open to transition to full comfort measures.  PMT will follow peripherally at this time.  Patient's family have PMT contact information and have been encouraged to call with additional needs.    Thank you for your referral and allowing PMT to assist in Betty Jackson's care.   Dorthy Cooler, [facility] Palliative Medicine Team  Team Phone # [phone]   NO CHARGE

## 2022-05-26 DIAGNOSIS — J189 Pneumonia, unspecified organism: Secondary | ICD-10-CM | POA: Diagnosis not present

## 2022-05-26 LAB — COMPREHENSIVE METABOLIC PANEL
ALT: 15 U/L (ref 0–44)
AST: 21 U/L (ref 15–41)
Albumin: 2.4 g/dL — ABNORMAL LOW (ref 3.5–5.0)
Alkaline Phosphatase: 61 U/L (ref 38–126)
Anion gap: 5 (ref 5–15)
BUN: 13 mg/dL (ref 8–23)
CO2: 24 mmol/L (ref 22–32)
Calcium: 8.2 mg/dL — ABNORMAL LOW (ref 8.9–10.3)
Chloride: 113 mmol/L — ABNORMAL HIGH (ref 98–111)
Creatinine, Ser: 1.11 mg/dL — ABNORMAL HIGH (ref 0.44–1.00)
GFR, Estimated: 49 mL/min — ABNORMAL LOW (ref >60)
Glucose, Bld: 84 mg/dL (ref 70–99)
Potassium: 3.1 mmol/L — ABNORMAL LOW (ref 3.5–5.1)
Sodium: 142 mmol/L (ref 135–145)
Total Bilirubin: 0.6 mg/dL (ref 0.3–1.2)
Total Protein: 4.7 g/dL — ABNORMAL LOW (ref 6.5–8.1)

## 2022-05-26 LAB — CBC WITH DIFFERENTIAL/PLATELET
Abs Immature Granulocytes: 0 10*3/uL (ref 0.00–0.07)
Basophils Absolute: 0 10*3/uL (ref 0.0–0.1)
Basophils Relative: 0 %
Eosinophils Absolute: 0.1 10*3/uL (ref 0.0–0.5)
Eosinophils Relative: 1 %
HCT: 30.7 % — ABNORMAL LOW (ref 36.0–46.0)
Hemoglobin: 10 g/dL — ABNORMAL LOW (ref 12.0–15.0)
Lymphocytes Relative: 17 %
Lymphs Abs: 2.2 10*3/uL (ref 0.7–4.0)
MCH: 31.3 pg (ref 26.0–34.0)
MCHC: 32.6 g/dL (ref 30.0–36.0)
MCV: 96.2 fL (ref 80.0–100.0)
Monocytes Absolute: 0.3 10*3/uL (ref 0.1–1.0)
Monocytes Relative: 2 %
Neutro Abs: 10.2 10*3/uL — ABNORMAL HIGH (ref 1.7–7.7)
Neutrophils Relative %: 80 %
Platelets: 187 10*3/uL (ref 150–400)
RBC: 3.19 MIL/uL — ABNORMAL LOW (ref 3.87–5.11)
RDW: 14 % (ref 11.5–15.5)
WBC: 12.8 10*3/uL — ABNORMAL HIGH (ref 4.0–10.5)
nRBC: 0.3 % — ABNORMAL HIGH (ref 0.0–0.2)
nRBC: 1 /100 WBC — ABNORMAL HIGH

## 2022-05-26 LAB — GLUCOSE, CAPILLARY: Glucose-Capillary: 125 mg/dL — ABNORMAL HIGH (ref 70–99)

## 2022-05-26 LAB — PHOSPHORUS: Phosphorus: 2.6 mg/dL (ref 2.5–4.6)

## 2022-05-26 LAB — MAGNESIUM: Magnesium: 1.8 mg/dL (ref 1.7–2.4)

## 2022-05-26 LAB — SURGICAL PATHOLOGY

## 2022-05-26 MED ORDER — POTASSIUM CHLORIDE CRYS ER 20 MEQ PO TBCR
40.0000 meq | EXTENDED_RELEASE_TABLET | ORAL | Status: AC
  Administered 2022-05-26 (×2): 40 meq via ORAL
  Filled 2022-05-26 (×2): qty 2

## 2022-05-26 MED ORDER — METOPROLOL TARTRATE 25 MG PO TABS
25.0000 mg | ORAL_TABLET | Freq: Two times a day (BID) | ORAL | Status: DC
  Administered 2022-05-26 – 2022-05-28 (×4): 25 mg via ORAL
  Filled 2022-05-26 (×4): qty 1

## 2022-05-26 MED ORDER — HYDRALAZINE HCL 20 MG/ML IJ SOLN: 5.0000 mg | Freq: Four times a day (QID) | INTRAMUSCULAR | Status: DC | PRN

## 2022-05-26 MED ORDER — HYDROXYZINE HCL 25 MG PO TABS
25.0000 mg | ORAL_TABLET | Freq: Once | ORAL | Status: AC
  Administered 2022-05-26: 25 mg via ORAL
  Filled 2022-05-26: qty 1

## 2022-05-26 MED ORDER — FENTANYL 25 MCG/HR TD PT72
1.0000 | MEDICATED_PATCH | TRANSDERMAL | Status: DC
  Administered 2022-05-26: 1 via TRANSDERMAL
  Filled 2022-05-26: qty 1

## 2022-05-26 MED ORDER — THIAMINE MONONITRATE 100 MG PO TABS
100.0000 mg | ORAL_TABLET | Freq: Every day | ORAL | Status: DC
  Administered 2022-05-27 – 2022-05-28 (×2): 100 mg via ORAL
  Filled 2022-05-26 (×2): qty 1

## 2022-05-26 MED ORDER — HYDRALAZINE HCL 20 MG/ML IJ SOLN
5.0000 mg | Freq: Four times a day (QID) | INTRAMUSCULAR | Status: DC | PRN
  Administered 2022-05-26: 5 mg via INTRAVENOUS

## 2022-05-26 MED ORDER — HYDRALAZINE HCL 20 MG/ML IJ SOLN
INTRAMUSCULAR | Status: AC
  Administered 2022-05-26: 20 mg
  Filled 2022-05-26: qty 1

## 2022-05-26 MED ORDER — FENTANYL 12 MCG/HR TD PT72: 2.0000 | MEDICATED_PATCH | TRANSDERMAL | Status: DC

## 2022-05-26 MED ORDER — IPRATROPIUM-ALBUTEROL 0.5-2.5 (3) MG/3ML IN SOLN
3.0000 mL | RESPIRATORY_TRACT | Status: AC
  Administered 2022-05-26: 3 mL via RESPIRATORY_TRACT
  Filled 2022-05-26: qty 3

## 2022-05-26 NOTE — NC FL2 (Signed)
[place] LEVEL OF CARE FORM     IDENTIFICATION  Patient Name: Betty Jackson Birthdate: 1934-07-31 Sex: female Admission Date (Current Location): 05/20/2022  [facility] and [place] Number:  Herbalist and Address:  [facility], [place] [address] [place], [place] [address]      Provider Number: [id number]  Attending Physician Name and Address:  Elodia Florence., *  Relative Name and Phone Number:       Current Level of Care: Hospital Recommended Level of Care: [place] Prior Approval Number:    Date Approved/Denied:   PASRR Number: [id number]  Discharge Plan: SNF    Current Diagnoses: Patient Active Problem List   Diagnosis Date Noted   Gastritis and gastroduodenitis 05/23/2022   Pneumonia 05/20/2022   Urinary tract infection without hematuria    Bleeding hemorrhoids    Rectal ulcer    Hematochezia    Odynophagia    Esophagitis    Lung nodules 09/04/2021   Sepsis secondary to UTI ([place]) 09/03/2021   Atrial fibrillation with RVR ([place]) 08/06/2021   Dysphagia [id number]   Acute metabolic encephalopathy [id number]   Hypoxemia 03/15/2021   Acute kidney injury superimposed on chronic kidney disease ([place]) 03/15/2021   Stroke-like symptoms 07/14/2020   Cough 05/24/2020   Dyspnea 05/24/2020   Optic neuritis 05/24/2020   Osteopenia 05/24/2020   Rash 05/24/2020   Vitamin D deficiency 05/24/2020   Palliative care by specialist    Goals of care, counseling/discussion    DNR (do not resuscitate)    Weakness    Advanced directives, counseling/discussion    Pressure injury of skin 10/18/2019   Closed fracture of right distal femur ([place]) 10/17/2019   Femur fracture, right ([place]) 09/25/2019   Chronic diastolic CHF (congestive heart failure) ([place]) 09/25/2019   HCAP (healthcare-associated pneumonia) 06/19/2019   Encephalopathy 06/19/2019   Lumbar degenerative disc disease    Paroxysmal A-fib ([place])     Hypomagnesemia    C. difficile colitis    Diarrhea 05/29/2019   CHF (congestive heart failure) ([place]) 05/29/2019   Recurrent falls 04/30/2019   Pericardial effusion without cardiac tamponade 03/29/2019   Hypoalbuminemia 03/27/2019   Cardiac tamponade    Pericarditis 03/06/2019   Fever    Septic shock ([place])    Atypical chest pain    Atrial fibrillation with rapid ventricular response (HCC)    Nausea and vomiting 01/17/2019   Acute diverticulitis 01/13/2019   Chronic back pain 01/13/2019   Tachycardia 01/13/2019   Hypokalemia 01/13/2019   Chronic low back pain 07/10/2018   Influenza A 05/20/2017   Severe sepsis ([place]) 05/20/2017   Acute respiratory failure with hypoxia ([place]) 05/20/2017   CKD (chronic kidney disease), stage III ([place]) 05/20/2017   PAF (paroxysmal atrial fibrillation) ([place]) 05/20/2017   HTN (hypertension) 05/20/2017   Giant cell arteritis ([place]) 05/20/2017   Coronary disease 05/20/2017   Abnormal urinalysis 05/20/2017   Osteoporosis 05/08/2016   Herniated nucleus pulposus, L5-S1, right 11/04/2015   Major depressive disorder, recurrent episode, moderate ([place]) 07/16/2013   Abdominal pain, epigastric 06/22/2013   Lumbar stenosis 04/25/2013   Chronic insomnia 02/07/2013   Elevated transaminase level 02/07/2013   CVA (cerebral infarction) 10/29/2012   Visual changes 10/28/2012   Depression 10/28/2012   Anxiety state 10/28/2012   Acute on chronic anemia 10/28/2012   Nonspecific (abnormal) findings on radiological and other examination of gastrointestinal tract 01/25/2012   Hyponatremia 01/24/2012   Hematuria 01/24/2012   Enteritis 12/24/2011  Abdominal pain, other specified site 12/24/2011   Leg pain, right 02/18/2011   Rheumatoid arthritis ([place]) 02/04/2011   Temporal arteritis ([place]) 09/01/2010   Hypertension 09/01/2010   Hyperlipidemia 09/01/2010   History of atrial fibrillation 09/01/2010   CAD (coronary artery disease) 09/01/2010   GERD (gastroesophageal  reflux disease) 09/01/2010   Insomnia 09/01/2010    Orientation RESPIRATION BLADDER Height & Weight     Self, Time, Situation, Place  O2 (2L Nasal cannula) Incontinent, External catheter Weight: 187 lb 2.7 oz (84.9 kg) Height:  '5\' 4"'$  (162.6 cm)  BEHAVIORAL SYMPTOMS/MOOD NEUROLOGICAL BOWEL NUTRITION STATUS      Incontinent Diet (See dc summary)  AMBULATORY STATUS COMMUNICATION OF NEEDS Skin   Extensive Assist Verbally Normal                       Personal Care Assistance Level of Assistance  Bathing, Feeding, Dressing Bathing Assistance: Maximum assistance Feeding assistance: Maximum assistance Dressing Assistance: Maximum assistance     Functional Limitations Info             SPECIAL CARE FACTORS FREQUENCY                       Contractures Contractures Info: Not present    Additional Factors Info  Code Status, Allergies Code Status Info: DNR Allergies Info: Alendronate Sodium, Dextromethorphan           Current Medications (05/26/2022):  This is the current hospital active medication list Current Facility-Administered Medications  Medication Dose Route Frequency Provider Last Rate Last Admin   acetaminophen (TYLENOL) tablet 650 mg  650 mg Oral Q6H PRN Elodia Florence., MD   650 mg at 05/26/22 0901   Or   acetaminophen (TYLENOL) suppository 650 mg  650 mg Rectal Q6H PRN Elodia Florence., MD   650 mg at 05/20/22 2240   albuterol (PROVENTIL) (2.5 MG/3ML) 0.083% nebulizer solution 2.5 mg  2.5 mg Nebulization Q4H PRN Elodia Florence., MD   2.5 mg at 05/26/22 0950   allopurinol (ZYLOPRIM) tablet 100 mg  100 mg Oral Daily Elodia Florence., MD   100 mg at 05/26/22 0900   amiodarone (PACERONE) tablet 200 mg  200 mg Oral Daily Elodia Florence., MD   200 mg at 05/26/22 0901   atorvastatin (LIPITOR) tablet 80 mg  80 mg Oral QHS Elodia Florence., MD   80 mg at 05/25/22 2103   cefTRIAXone (ROCEPHIN) 2 g in sodium chloride 0.9 %  100 mL IVPB  2 g Intravenous Q24H Elodia Florence., MD 200 mL/hr at 05/25/22 1701 2 g at 05/25/22 1701   Chlorhexidine Gluconate Cloth 2 % PADS 6 each  6 each Topical Daily Elodia Florence., MD   6 each at 05/25/22 0944   escitalopram (LEXAPRO) tablet 10 mg  10 mg Oral Daily Elodia Florence., MD   10 mg at 05/26/22 0901   fentaNYL (DURAGESIC) 12 MCG/HR 1 patch  1 patch Transdermal Q72H Irene Pap N, DO   1 patch at 05/24/22 0354   fentaNYL (SUBLIMAZE) injection 12.5 mcg  12.5 mcg Intravenous Q2H PRN Elodia Florence., MD   12.5 mcg at 05/25/22 2207   HYDROcodone-acetaminophen (NORCO/VICODIN) 5-325 MG per tablet 1 tablet  1 tablet Oral QHS Elodia Florence., MD   1 tablet at 05/25/22 2102   hydrocortisone (ANUSOL-HC) 2.5 % rectal cream 1 Application  1 Application Rectal BID PRN Elodia Florence., MD       loratadine (CLARITIN) tablet 10 mg  10 mg Oral Daily Elodia Florence., MD   10 mg at 05/26/22 0901   metoprolol tartrate (LOPRESSOR) tablet 12.5 mg  12.5 mg Oral BID Elodia Florence., MD   12.5 mg at 05/26/22 0901   pantoprazole (PROTONIX) EC tablet 40 mg  40 mg Oral BID Elodia Florence., MD   40 mg at 05/26/22 0901   potassium chloride SA (KLOR-CON M) CR tablet 40 mEq  40 mEq Oral Q4H Elodia Florence., MD       predniSONE (DELTASONE) tablet 10 mg  10 mg Oral Q breakfast Elodia Florence., MD   10 mg at 05/26/22 0901   QUEtiapine (SEROQUEL) tablet 25 mg  25 mg Oral QHS Elodia Florence., MD   25 mg at 05/25/22 2103   sodium phosphate (FLEET) 7-19 GM/118ML enema 1 enema  1 enema Rectal Once Elodia Florence., MD       thiamine (VITAMIN B1) injection 100 mg  100 mg Intravenous Daily Elodia Florence., MD   100 mg at 05/25/22 [id number]     Discharge Medications: Please see discharge summary for a list of discharge medications.  Relevant Imaging Results:  Relevant Lab Results:   Additional Information SSN: [id number].  Palliative to follow at SNF.  Benard Halsted, LCSW

## 2022-05-26 NOTE — Progress Notes (Addendum)
PROGRESS NOTE    Betty Jackson  [id number] DOB: [birth year] DOA: 05/20/2022 PCP: Alvester Morin, MD  Chief Complaint  Patient presents with   Chest Pain   Emesis    Brief Narrative:   Betty Jackson is Betty Jackson 88 y.o. female with medical history significant of chronic pain on chronic opiates, atrial fib (on eliquis), RA and temporal arteritis (on chronic steroids) and other medical issues here with confusion.   She's been admitted with acute metabolic encephalopathy and hypoxia.  She has evidence of pneumonia on chest CT.  She was treated for acute hypoxic and hypercarbic respiratory failure due to pneumonia.  Also had encephalopathy related to respiratory failure with contribution from opiates and acute kidney injury.  She's improved over the last several days.  Hospitalization complicated by GI bleed with melena.  S/p EGD with pending bx, evidence of gastritis.    Approaching readiness for discharge, can likely d/c 2/28 if stable.  Plan to hold eliquis at discharge x1 week and can review risks/benefits with PCP outpatient if no further bleeding and stable hx.  Fentanyl patch increased back to 25 mcg/hr, which is less than her home 37.5.  Will monitor and ensure she does well on this relatively reduced dose prior to d/c home.  Assessment & Plan:   Principal Problem:   Pneumonia Active Problems:   Gastritis and gastroduodenitis  Goals of care See prior notes for additional details.  She's had significant improvement over past few days.  Currently DNR/DNI.  Palliative care has been consulted - recommend follow up at SNF.  Family understands the difficult nature of balancing their mother's opiates/pain control/encephalopathy/respiratory issues.  No narcan, family concerned for her comfort (in situation that she declines, they'd want supportive care and comfort measures rather than giving narcan).   Palliative care c/s, appreciate recs -  Maroon Stool  Concern for GI bleeding PPI  BID Holding heparin/anticoagulation - daughter noticed some blood yesterday, Hb stable - continue to hold anticoagulation for now, can reevaluate in 1 week if hb stable and bleeding resolve. Appreciate GI recs - EGD 2/24  mild gastritis, unlikely source of potential GI blood loss per GI (but possible).  Recommended considering CTA with additional overt bleeding.  Pending pathology.  Right now, unless hemodynamically significant bleed, would try to avoid this due to risk of AKI.   Acute Hypercarbic and Hypoxic Respiratory Failure  Due to pneumonia, opiates/respiratory depression, encephalopathy Treating pna with abx improved   Sepsis due to Community Acquired Pneumonia  CT chest abdomen pelvis with patchy density at both lung bases L>R (scarring vs pneumonia) 2/22 CXR with low lung volumes and bibasilar opacity Improved, currently on 2 L - wean as tolerated (chronic home 2 L prn).  improving leukocytosis (sepsis/infection + steroids).  Fit criteria for sepsis at admission with fever, leukocytosis, tachycardia, tachypnea, encephalopathy.  Completed 5 days ceftriaxone/azithromycin Wheezing noted overnight, none today - prn nebs Blood cultures pending, sputum cultures if able MRSA PCR RVP negative.  Negative covid, influenza, RSV   Acute Metabolic Encephalopathy  Delirium  Multifactorial -> pneumonia, hypercarbic resp failure, dehydration/AKI/hypernatremia, opiates/sedation --- suspect related to infection + developing AKI + opiates (there was thought that her norco dose was increased, but this doesn't seem to be the case) Follow b12 (wnl), folate (wnl), B1 (pending), VBG (resolved respiratory acidosis), ammonia (pending collection - will hold off for now, not drawn, but she's improved), TSH (elevated - follow free T4 - mildly elevated -> repeat after acute infection )  Head CT without acute intracranial abnormality EEG negative for seizures No narcan going forward. MRI brain without acute  findings.  Still waxing/waning - this has improved.    Acute Kidney Injury  Dehydration  Hypernatremia Suspect this represents volume depletion and dehydration in setting of her encephalopathy over the last few days Improving UA bland CT without hydro  Continue to trend   Nausea  Vomiting  Chest Pain  Abdominal Discomfort Suspect related to pneumonia Negative troponins Follow, seems improved   Atrial Fibrillation Chadsvasc 6 Anticoagulation on hold with concern for GI bleeding and continued dark stools - will follow Hb today and discuss with family Resume amiodarone and low dose metop Hold diltiazem for now due to bradycardia   Fecal Impaction Enema ordered by EDP, follow   Chronic Pain Reduced dose fentanyl patch Monitor on 25 mcg/hr fentanyl patch and nightly norco (if doing well on this regimen, would discharge with this rather than her prior to admission regimen) For issues sleeping, will start norco at night Stop meloxicam given concern for GI bleeding Family concerned about holding meds and prior issues with withdrawal, delirium, precipitated RVR, etc   Rheumatoid Arthritis  Hx Temporal Arteritis Hold sulfasalazine Steroid taper   Gout Resume allopurinol  HLD  CAD lipitor Eliquis on hold  Insomnia Seroquel nightly Norco nightly Delirium precautions  Obesity Body mass index is 31.41 kg/m.    DVT prophylaxis: SCD Code Status: DNR/DNI Family Communication: none at bedside Disposition:   Status is: Inpatient Remains inpatient appropriate because: continued need for inpatient care   Consultants:  Palliative care  Procedures:  none  Antimicrobials:  Anti-infectives (From admission, onward)    Start     Dose/Rate Route Frequency Ordered Stop   05/21/22 2000  azithromycin (ZITHROMAX) 500 mg in sodium chloride 0.9 % 250 mL IVPB        500 mg 250 mL/hr over 60 Minutes Intravenous Every 24 hours 05/20/22 2223 05/26/22 1959   05/21/22 1700   cefTRIAXone (ROCEPHIN) 2 g in sodium chloride 0.9 % 100 mL IVPB        2 g 200 mL/hr over 30 Minutes Intravenous Every 24 hours 05/20/22 2223 05/28/22 1659   05/20/22 2230  cefTRIAXone (ROCEPHIN) 2 g in sodium chloride 0.9 % 100 mL IVPB  Status:  Discontinued        2 g 200 mL/hr over 30 Minutes Intravenous Every 24 hours 05/20/22 2218 05/20/22 2221   05/20/22 2230  azithromycin (ZITHROMAX) 500 mg in sodium chloride 0.9 % 250 mL IVPB  Status:  Discontinued        500 mg 250 mL/hr over 60 Minutes Intravenous Every 24 hours 05/20/22 2218 05/20/22 2222   05/20/22 1630  cefTRIAXone (ROCEPHIN) 2 g in sodium chloride 0.9 % 100 mL IVPB        2 g 200 mL/hr over 30 Minutes Intravenous  Once 05/20/22 1619 05/20/22 1754   05/20/22 1630  azithromycin (ZITHROMAX) 500 mg in sodium chloride 0.9 % 250 mL IVPB        500 mg 250 mL/hr over 60 Minutes Intravenous  Once 05/20/22 1619 05/20/22 2024       Subjective: Slept well overnight  Objective: Vitals:   05/25/22 0227 05/25/22 0400 05/25/22 0500 05/25/22 0800  BP:  (!) 147/59  (!) 152/60  Pulse:  60  (!) 52  Resp:  17  14  Temp:  98 F (36.7 C)    TempSrc:  Oral  Oral  SpO2: 96% 96%  95%  Weight:   83 kg   Height:        Intake/Output Summary (Last 24 hours) at 05/25/2022 0920 Last data filed at 05/25/2022 0836 Gross per 24 hour  Intake 1160.95 ml  Output 700 ml  Net 460.95 ml    Filed Weights   05/22/22 0426 05/24/22 0500 05/25/22 0500  Weight: 82.3 kg 83.1 kg 83 kg    Examination:  General: No acute distress. Cardiovascular: RRR Lungs: unlabored, no wheezing on exam Abdomen: Soft, nontender, nondistended Neurological: Alert and oriented 3. Moves all extremities 4 with equal strength. Cranial nerves II through XII grossly intact. Extremities: No clubbing or cyanosis. No edema.   Data Reviewed: I have personally reviewed following labs and imaging studies  CBC: Recent Labs  Lab 05/21/22 0600 05/21/22 0609  05/21/22 2116 05/22/22 0625 05/22/22 1025 05/22/22 1303 05/22/22 2106 05/23/22 0118 05/23/22 0608 05/23/22 1755 05/24/22 0830  WBC 28.5*  --  25.1* 26.8*  --   --   --   --  19.2*  --  14.3*  NEUTROABS  --   --   --  25.2*  --   --   --   --   --   --   --   HGB 11.8*   < > 9.9* 9.8* 11.1*   < > 9.6* 8.5* 8.4* 9.2* 8.7*  HCT 39.1   < > 32.9* 31.9* 34.8*  --   --   --  27.4* 29.9* 25.0*  MCV 104.0*  --  103.8* 102.6*  --   --   --   --  101.1*  --  103.7*  PLT 189  --  183 185  --   --   --   --  161  --  157   < > = values in this interval not displayed.    Basic Metabolic Panel: Recent Labs  Lab 05/21/22 0600 05/21/22 0609 05/21/22 1519 05/22/22 0625 05/23/22 0608 05/24/22 0241  NA 149* 150* 146* 142 144 145  K 3.8 3.8 4.6 4.1 3.9 3.9  CL 112*  --  111 111 114* 114*  CO2 22  --  25 25 21* 24  GLUCOSE 173*  --  281* 174* 107* 120*  BUN 31*  --  38* 42* 45* 37*  CREATININE 2.30*  --  2.58* 2.43* 2.24* 1.62*  CALCIUM 8.6*  --  7.9* 7.4* 7.2* 7.5*  MG  --   --   --  1.5* 1.5* 2.4  PHOS  --   --   --  3.3 2.9 2.3*    GFR: Estimated Creatinine Clearance: 26.5 mL/min (Hilda Rynders) (by C-G formula based on SCr of 1.62 mg/dL (H)).  Liver Function Tests: Recent Labs  Lab 05/20/22 1316 05/21/22 0600 05/22/22 0625 05/23/22 0608 05/24/22 0241  AST 24 41 '29 26 22  '$ ALT '14 19 16 15 15  '$ ALKPHOS 81 105 74 66 65  BILITOT 1.4* 1.0 0.5 0.4 0.6  PROT 6.5 5.7* 5.1* 4.7* 4.8*  ALBUMIN 3.4* 2.7* 2.4* 2.4* 2.4*    CBG: Recent Labs  Lab 05/21/22 0353 05/21/22 2026  GLUCAP 155* 217*     Recent Results (from the past 240 hour(s))  Resp panel by RT-PCR (RSV, Flu Jamisen Hawes&B, Covid) Anterior Nasal Swab     Status: None   Collection Time: 05/20/22 12:41 PM   Specimen: Anterior Nasal Swab  Result Value Ref Range Status   SARS Coronavirus 2 by RT PCR NEGATIVE NEGATIVE Final  Influenza Tashima Scarpulla by PCR NEGATIVE NEGATIVE Final   Influenza B by PCR NEGATIVE NEGATIVE Final    Comment: (NOTE) The Xpert  Xpress SARS-CoV-2/FLU/RSV plus assay is intended as an aid in the diagnosis of influenza from Nasopharyngeal swab specimens and should not be used as Saladin Petrelli sole basis for treatment. Nasal washings and aspirates are unacceptable for Xpert Xpress SARS-CoV-2/FLU/RSV testing.  Fact Sheet for Patients: EntrepreneurPulse.com.au  Fact Sheet for Healthcare Providers: IncredibleEmployment.be  This test is not yet approved or cleared by the [place] FDA and has been authorized for detection and/or diagnosis of SARS-CoV-2 by FDA under an Emergency Use Authorization (EUA). This EUA will remain in effect (meaning this test can be used) for the duration of the COVID-19 declaration under Section 564(b)(1) of the Act, 21 U.S.C. section 360bbb-3(b)(1), unless the authorization is terminated or revoked.     Resp Syncytial Virus by PCR NEGATIVE NEGATIVE Final    Comment: (NOTE) Fact Sheet for Patients: EntrepreneurPulse.com.au  Fact Sheet for Healthcare Providers: IncredibleEmployment.be  This test is not yet approved or cleared by the [place] FDA and has been authorized for detection and/or diagnosis of SARS-CoV-2 by FDA under an Emergency Use Authorization (EUA). This EUA will remain in effect (meaning this test can be used) for the duration of the COVID-19 declaration under Section 564(b)(1) of the Act, 21 U.S.C. section 360bbb-3(b)(1), unless the authorization is terminated or revoked.  Performed at [facility], [place] [address], [place], [place] [address]   Respiratory (~20 pathogens) panel by PCR     Status: None   Collection Time: 05/20/22  1:48 PM   Specimen: Nasopharyngeal Swab; Respiratory  Result Value Ref Range Status   Adenovirus NOT DETECTED NOT DETECTED Final   Coronavirus 229E NOT DETECTED NOT DETECTED Final    Comment: (NOTE) The Coronavirus on the Respiratory Panel, DOES NOT test for the  novel  Coronavirus (2019 nCoV)    Coronavirus HKU1 NOT DETECTED NOT DETECTED Final   Coronavirus NL63 NOT DETECTED NOT DETECTED Final   Coronavirus OC43 NOT DETECTED NOT DETECTED Final   Metapneumovirus NOT DETECTED NOT DETECTED Final   Rhinovirus / Enterovirus NOT DETECTED NOT DETECTED Final   Influenza Jamirra Curnow NOT DETECTED NOT DETECTED Final   Influenza B NOT DETECTED NOT DETECTED Final   Parainfluenza Virus 1 NOT DETECTED NOT DETECTED Final   Parainfluenza Virus 2 NOT DETECTED NOT DETECTED Final   Parainfluenza Virus 3 NOT DETECTED NOT DETECTED Final   Parainfluenza Virus 4 NOT DETECTED NOT DETECTED Final   Respiratory Syncytial Virus NOT DETECTED NOT DETECTED Final   Bordetella pertussis NOT DETECTED NOT DETECTED Final   Bordetella Parapertussis NOT DETECTED NOT DETECTED Final   Chlamydophila pneumoniae NOT DETECTED NOT DETECTED Final   Mycoplasma pneumoniae NOT DETECTED NOT DETECTED Final    Comment: Performed at [facility], [place]. [address], [place], [place] [address]  Culture, blood (routine x 2) Call MD if unable to obtain prior to antibiotics being given     Status: None (Preliminary result)   Collection Time: 05/20/22 10:38 PM   Specimen: BLOOD RIGHT HAND  Result Value Ref Range Status   Specimen Description BLOOD RIGHT HAND  Final   Special Requests   Final    BOTTLES DRAWN AEROBIC AND ANAEROBIC Blood Culture results may not be optimal due to an inadequate volume of blood received in culture bottles   Culture   Final    NO GROWTH 4 DAYS Performed at [facility], [address], [place], [place] [address]    Report Status PENDING  Incomplete  Culture, blood (routine x 2) Call MD if unable to obtain prior to antibiotics being given     Status: None (Preliminary result)   Collection Time: 05/20/22 10:38 PM   Specimen: BLOOD LEFT HAND  Result Value Ref Range Status   Specimen Description BLOOD LEFT HAND  Final   Special Requests   Final    BOTTLES DRAWN AEROBIC AND  ANAEROBIC Blood Culture results may not be optimal due to an inadequate volume of blood received in culture bottles   Culture   Final    NO GROWTH 4 DAYS Performed at [facility], [place] [address] [place],  [address]    Report Status PENDING  Incomplete         Radiology Studies: No results found.      Scheduled Meds:  allopurinol  100 mg Oral Daily   amiodarone  200 mg Oral Daily   atorvastatin  80 mg Oral QHS   Chlorhexidine Gluconate Cloth  6 each Topical Daily   escitalopram  10 mg Oral Daily   fentaNYL  1 patch Transdermal Q72H   loratadine  10 mg Oral Daily   metoprolol tartrate  12.5 mg Oral BID   pantoprazole  40 mg Oral BID   [START ON 05/26/2022] predniSONE  10 mg Oral Q breakfast   QUEtiapine  25 mg Oral QHS   sodium phosphate  1 enema Rectal Once   thiamine (VITAMIN B1) injection  100 mg Intravenous Daily   Continuous Infusions:  azithromycin 250 mL/hr at 05/25/22 0836   cefTRIAXone (ROCEPHIN)  IV 200 mL/hr at 05/25/22 0836     LOS: 5 days    Time spent: over 30 min    Fayrene Helper, MD [facility]   To contact the attending provider between 7A-7P or the covering provider during after hours 7P-7A, please log into the web site [email or web address] and access using universal [place] password for that web site. If you do not have the password, please call the hospital operator.  05/25/2022, 9:20 AM

## 2022-05-26 NOTE — TOC Initial Note (Signed)
Transition of Care [facility]) - Initial/Assessment Note    Patient Details  Name: Betty Jackson MRN: [id number] Date of Birth: 1935/01/24  Transition of Care [facility]) CM/SW Contact:    Benard Halsted, LCSW Phone Number: 05/26/2022, 3:14 PM  Clinical Narrative:                 CSW received consult for [facility] Palliative program to follow patient at SNF. CSW sent referral to [facility] and made [facility] aware. Will follow for medical readiness.   Expected Discharge Plan: Skilled Nursing Facility Barriers to Discharge: Continued Medical Work up   Patient Goals and CMS Choice Patient states their goals for this hospitalization and ongoing recovery are:: Return to SNF CMS Medicare.gov Compare Post Acute Care list provided to:: Patient Represenative (must comment) Choice offered to / list presented to :  (Granddaughter)      Expected Discharge Plan and Services In-house Referral: Clinical Social Work   Post Acute Care Choice: [place] Living arrangements for the past 2 months: [place]                                      Prior Living Arrangements/Services Living arrangements for the past 2 months: [place] Lives with:: Facility Resident Patient language and need for interpreter reviewed:: Yes Do you feel safe going back to the place where you live?: Yes      Need for Family Participation in Patient Care: Yes (Comment) Care giver support system in place?: Yes (comment)   Criminal Activity/Legal Involvement Pertinent to Current Situation/Hospitalization: No - Comment as needed  Activities of Daily Living Home Assistive Devices/Equipment: Other (Comment) (Patient is from nursing home and receives help with all ADLS) ADL Screening (condition at time of admission) Patient's cognitive ability adequate to safely complete daily activities?: Yes Is the patient deaf or have difficulty hearing?: Yes Does the patient  have difficulty seeing, even when wearing glasses/contacts?: No Does the patient have difficulty concentrating, remembering, or making decisions?: No Patient able to express need for assistance with ADLs?: Yes Does the patient have difficulty dressing or bathing?: Yes Independently performs ADLs?: No Communication: Independent Dressing (OT): Needs assistance Is this a change from baseline?: Pre-admission baseline Grooming: Needs assistance Is this a change from baseline?: Pre-admission baseline Feeding: Independent Bathing: Needs assistance Is this a change from baseline?: Pre-admission baseline Toileting: Needs assistance Is this a change from baseline?: Pre-admission baseline In/Out Bed: Dependent Is this a change from baseline?: Pre-admission baseline Walks in Home: Dependent Is this a change from baseline?: Pre-admission baseline Does the patient have difficulty walking or climbing stairs?: Yes Weakness of Legs: Both Weakness of Arms/Hands: Both  Permission Sought/Granted Permission sought to share information with : Facility Sport and exercise psychologist, Family Supports Permission granted to share information with : Yes, Verbal Permission Granted  Share Information with NAME: Terrence Dupont  Permission granted to share info w AGENCY: Personnel officer granted to share info w Relationship: Granddaughter  Permission granted to share info w Contact Information: [phone]  Emotional Assessment Appearance:: Appears stated age Attitude/Demeanor/Rapport: Engaged Affect (typically observed): Accepting, Appropriate Orientation: : Oriented to Self, Oriented to Place, Oriented to  Time, Oriented to Situation Alcohol / Substance Use: Not Applicable Psych Involvement: No (comment)  Admission diagnosis:  Pneumonia [J18.9] Hypoxia [R09.02] AKI (acute kidney injury) ([place]) [N17.9] Pneumonia of both lungs due to infectious organism,  unspecified part of lung [J18.9] Nausea and vomiting,  unspecified vomiting type [R11.2] Patient Active Problem List   Diagnosis Date Noted   Gastritis and gastroduodenitis 05/23/2022   Pneumonia 05/20/2022   Urinary tract infection without hematuria    Bleeding hemorrhoids    Rectal ulcer    Hematochezia    Odynophagia    Esophagitis    Lung nodules 09/04/2021   Sepsis secondary to UTI ([place]) 09/03/2021   Atrial fibrillation with RVR ([place]) 08/06/2021   Dysphagia [id number]   Acute metabolic encephalopathy [id number]   Hypoxemia 03/15/2021   Acute kidney injury superimposed on chronic kidney disease ([place]) 03/15/2021   Stroke-like symptoms 07/14/2020   Cough 05/24/2020   Dyspnea 05/24/2020   Optic neuritis 05/24/2020   Osteopenia 05/24/2020   Rash 05/24/2020   Vitamin D deficiency 05/24/2020   Palliative care by specialist    Goals of care, counseling/discussion    DNR (do not resuscitate)    Weakness    Advanced directives, counseling/discussion    Pressure injury of skin 10/18/2019   Closed fracture of right distal femur ([place]) 10/17/2019   Femur fracture, right ([place]) 09/25/2019   Chronic diastolic CHF (congestive heart failure) ([place]) 09/25/2019   HCAP (healthcare-associated pneumonia) 06/19/2019   Encephalopathy 06/19/2019   Lumbar degenerative disc disease    Paroxysmal A-fib ([place])    Hypomagnesemia    C. difficile colitis    Diarrhea 05/29/2019   CHF (congestive heart failure) ([place]) 05/29/2019   Recurrent falls 04/30/2019   Pericardial effusion without cardiac tamponade 03/29/2019   Hypoalbuminemia 03/27/2019   Cardiac tamponade    Pericarditis 03/06/2019   Fever    Septic shock ([place])    Atypical chest pain    Atrial fibrillation with rapid ventricular response (HCC)    Nausea and vomiting 01/17/2019   Acute diverticulitis 01/13/2019   Chronic back pain 01/13/2019   Tachycardia 01/13/2019   Hypokalemia 01/13/2019   Chronic low back pain 07/10/2018   Influenza A 05/20/2017   Severe sepsis ([place]) 05/20/2017    Acute respiratory failure with hypoxia ([place]) 05/20/2017   CKD (chronic kidney disease), stage III ([place]) 05/20/2017   PAF (paroxysmal atrial fibrillation) ([place]) 05/20/2017   HTN (hypertension) 05/20/2017   Giant cell arteritis ([place]) 05/20/2017   Coronary disease 05/20/2017   Abnormal urinalysis 05/20/2017   Osteoporosis 05/08/2016   Herniated nucleus pulposus, L5-S1, right 11/04/2015   Major depressive disorder, recurrent episode, moderate ([place]) 07/16/2013   Abdominal pain, epigastric 06/22/2013   Lumbar stenosis 04/25/2013   Chronic insomnia 02/07/2013   Elevated transaminase level 02/07/2013   CVA (cerebral infarction) 10/29/2012   Visual changes 10/28/2012   Depression 10/28/2012   Anxiety state 10/28/2012   Acute on chronic anemia 10/28/2012   Nonspecific (abnormal) findings on radiological and other examination of gastrointestinal tract 01/25/2012   Hyponatremia 01/24/2012   Hematuria 01/24/2012   Enteritis 12/24/2011   Abdominal pain, other specified site 12/24/2011   Leg pain, right 02/18/2011   Rheumatoid arthritis ([place]) 02/04/2011   Temporal arteritis ([place]) 09/01/2010   Hypertension 09/01/2010   Hyperlipidemia 09/01/2010   History of atrial fibrillation 09/01/2010   CAD (coronary artery disease) 09/01/2010   GERD (gastroesophageal reflux disease) 09/01/2010   Insomnia 09/01/2010   PCP:  Alvester Morin, MD Pharmacy:  No Pharmacies Listed    Social Determinants of Health (SDOH) Social History: SDOH Screenings   Food Insecurity: No Food Insecurity (05/21/2022)  Housing: Low Risk  (05/21/2022)  Transportation Needs: No Transportation Needs (05/21/2022)  Utilities: Not  At Risk (05/21/2022)  Alcohol Screen: Low Risk  (07/16/2020)  Depression (PHQ2-9): Low Risk  (07/16/2020)  Financial Resource Strain: Low Risk  (07/16/2020)  Physical Activity: Inactive (07/16/2020)  Social Connections: Unknown (07/16/2020)  Tobacco Use: Low Risk  (05/25/2022)   SDOH  Interventions:     Readmission Risk Interventions    10/30/2019    9:48 AM 09/26/2019    4:29 PM  Readmission Risk Prevention Plan  Transportation Screening Complete Complete  Medication Review (RN Care Manager) Complete Complete  PCP or Specialist appointment within 3-5 days of discharge Complete Complete  HRI or Home Care Consult Complete Complete  SW Recovery Care/Counseling Consult Complete Complete  Palliative Care Screening Complete Complete  [place] Not Applicable Complete

## 2022-05-26 NOTE — Care Management Important Message (Signed)
Important Message  Patient Details  Name: Betty Jackson MRN: [id number] Date of Birth: 1934-07-04   Medicare Important Message Given:  Yes     [name] [name] 05/26/2022, 9:47 AM

## 2022-05-26 NOTE — Care Management (Signed)
  Transition of Care (TOC) Screening Note   Patient Details  Name: Betty Jackson Date of Birth: 08-31-1934   Transition of [facility]) CM/SW Contact:    Carles Collet, RN Phone Number: 05/26/2022, 8:46 AM    Transition of Care Department [facility]) has reviewed patient and we will continue to monitor patient advancement through interdisciplinary progression rounds.   Patient admitted from [facility] facility, chronic O2, TOC will assist with transition at discharge.

## 2022-05-27 DIAGNOSIS — N179 Acute kidney failure, unspecified: Secondary | ICD-10-CM

## 2022-05-27 DIAGNOSIS — Z515 Encounter for palliative care: Secondary | ICD-10-CM

## 2022-05-27 DIAGNOSIS — K2970 Gastritis, unspecified, without bleeding: Secondary | ICD-10-CM

## 2022-05-27 DIAGNOSIS — R112 Nausea with vomiting, unspecified: Secondary | ICD-10-CM | POA: Diagnosis not present

## 2022-05-27 DIAGNOSIS — Z66 Do not resuscitate: Secondary | ICD-10-CM

## 2022-05-27 DIAGNOSIS — R0902 Hypoxemia: Secondary | ICD-10-CM | POA: Diagnosis not present

## 2022-05-27 DIAGNOSIS — J189 Pneumonia, unspecified organism: Secondary | ICD-10-CM | POA: Diagnosis not present

## 2022-05-27 DIAGNOSIS — Z789 Other specified health status: Secondary | ICD-10-CM

## 2022-05-27 DIAGNOSIS — K2990 Gastroduodenitis, unspecified, without bleeding: Secondary | ICD-10-CM

## 2022-05-27 LAB — VITAMIN B1: Vitamin B1 (Thiamine): 230 nmol/L — ABNORMAL HIGH (ref 66.5–200.0)

## 2022-05-27 MED ORDER — HYDROCODONE-ACETAMINOPHEN 5-325 MG PO TABS: 1.0000 | ORAL_TABLET | Freq: Two times a day (BID) | ORAL | Status: DC | PRN

## 2022-05-27 MED ORDER — POLYETHYLENE GLYCOL 3350 17 G PO PACK
17.0000 g | PACK | Freq: Every day | ORAL | Status: DC
  Filled 2022-05-27: qty 1

## 2022-05-27 MED ORDER — SENNOSIDES-DOCUSATE SODIUM 8.6-50 MG PO TABS
2.0000 | ORAL_TABLET | Freq: Every day | ORAL | Status: DC
  Administered 2022-05-27: 2 via ORAL
  Filled 2022-05-27: qty 2

## 2022-05-27 NOTE — Progress Notes (Signed)
Physical Therapy Treatment Patient Details Name: Betty Jackson MRN: [id number] DOB: 1934/07/20 Today's Date: 05/27/2022   History of Present Illness Pt is 88 year old presented to [facility] on  05/20/22 with confusion and hypoxia. Pt found to be septic due to community acquired PNA. PMH - CVA, ckd, chronic pain, temporal arteritis, first degree AV block, HFpEF, RA, HTN, Afib, CAD, rt hip hemiarthroplasty, lumbar fusion    PT Comments    Pt limited during today's session by lethargy. Agreeable to therapy intermittently during session then stating she wants the blankets on her to warm up prior to doing therapy, eventually declining any EOB or OOB mobility at this time. Pt placed in chair position in bed and partially participatory in TherEx, eyes remaining closed throughout. Pt able to assist in rolling to reposition bed pad and remove cords from under pt, but resisting any attempts of sitting EOB. Pt educated on importance of mobility but requesting to return to supine to rest, blankets repositioned over pt. Pt will continue to benefit from skilled acute PT to progress mobility as tolerated, discharge plan remains appropriate.     Recommendations for follow up therapy are one component of a multi-disciplinary discharge planning process, led by the attending physician.  Recommendations may be updated based on patient status, additional functional criteria and insurance authorization.  Follow Up Recommendations  Long-term institutional care without follow-up therapy Can patient physically be transported by private vehicle: No   Assistance Recommended at Discharge Frequent or constant Supervision/Assistance  Patient can return home with the following A lot of help with walking and/or transfers;A lot of help with bathing/dressing/bathroom   Equipment Recommendations  None recommended by PT    Recommendations for Other Services       Precautions / Restrictions Precautions Precautions:  Fall Restrictions Weight Bearing Restrictions: No     Mobility  Bed Mobility Overal bed mobility: Needs Assistance Bed Mobility: Rolling Rolling: Mod assist         General bed mobility comments: modA to roll to reposition bed pads under pt, pt declining and EOB or OOB mobility, placed in chair position but pt not tolerating due to wanting to return to sleep    Transfers                   General transfer comment: pt declining any attempts    Ambulation/Gait               General Gait Details: Unable   Stairs             Wheelchair Mobility    Modified Rankin (Stroke Patients Only)       Balance Overall balance assessment: Needs assistance                                          Cognition Arousal/Alertness: Lethargic Behavior During Therapy: Flat affect Overall Cognitive Status: No family/caregiver present to determine baseline cognitive functioning                                 General Comments: pt lethargic/drowsy, intermittently responding to commands/questions and opening eyes but primarily with eyes closed throughout session        Exercises General Exercises - Lower Extremity Ankle Circles/Pumps: AAROM, Both, 10 reps, Supine Short Arc Quad: AAROM, Both, 10 reps,  Supine    General Comments General comments (skin integrity, edema, etc.): VSS on 3L O2 [place]      Pertinent Vitals/Pain Pain Assessment Pain Assessment: Faces Faces Pain Scale: No hurt    Home Living                          Prior Function            PT Goals (current goals can now be found in the care plan section) Acute Rehab PT Goals Patient Stated Goal: not stated PT Goal Formulation: With patient Time For Goal Achievement: 06/07/22 Potential to Achieve Goals: Fair Progress towards PT goals: Progressing toward goals (will further assess when pt more participatory)    Frequency    Min 2X/week      PT  Plan Current plan remains appropriate    Co-evaluation              AM-PAC PT "6 Clicks" Mobility   Outcome Measure  Help needed turning from your back to your side while in a flat bed without using bedrails?: A Lot Help needed moving from lying on your back to sitting on the side of a flat bed without using bedrails?: A Lot Help needed moving to and from a bed to a chair (including a wheelchair)?: Total Help needed standing up from a chair using your arms (e.g., wheelchair or bedside chair)?: A Lot Help needed to walk in hospital room?: Total Help needed climbing 3-5 steps with a railing? : Total 6 Click Score: 9    End of Session Equipment Utilized During Treatment: Oxygen Activity Tolerance: Patient limited by fatigue Patient left: in bed;with call bell/phone within reach;with bed alarm set Nurse Communication: Mobility status PT Visit Diagnosis: Other abnormalities of gait and mobility (R26.89);Muscle weakness (generalized) (M62.81)     Time: [id number] PT Time Calculation (min) (ACUTE ONLY): 12 min  Charges:  $Therapeutic Activity: 8-22 mins                     Charlynne Cousins, PT DPT Acute Rehabilitation Services Office [phone]    Luvenia Heller 05/27/2022, 3:02 PM

## 2022-05-27 NOTE — Progress Notes (Signed)
Occupational Therapy Treatment Patient Details Name: Betty Jackson MRN: [id number] DOB: Aug 11, 1934 Today's Date: 05/27/2022   History of present illness Pt is 88 year old presented to [facility] on  05/20/22 with confusion and hypoxia. Pt found to be septic due to community acquired PNA. PMH - CVA, ckd, chronic pain, temporal arteritis, first degree AV block, HFpEF, RA, HTN, Afib, CAD, rt hip hemiarthroplasty, lumbar fusion   OT comments  Patient seen after completing lunch and agreeable to get to EOB. Patient demonstrating gains with bed mobility from assist of 2 to mod assist of one. Patient improving on sitting balance while on EOB performing grooming tasks but quickly fatigues and required max assist to return to supine .  Patient coughing during visit and granddaughter states that she has a chronic cough. Patient to continue to be followed by acute OT to focus on functional transfers, bathing and dressing.    Recommendations for follow up therapy are one component of a multi-disciplinary discharge planning process, led by the attending physician.  Recommendations may be updated based on patient status, additional functional criteria and insurance authorization.    Follow Up Recommendations  Skilled nursing-short term rehab (<3 hours/day)     Assistance Recommended at Discharge Frequent or constant Supervision/Assistance  Patient can return home with the following  A lot of help with walking and/or transfers;A lot of help with bathing/dressing/bathroom;Assistance with cooking/housework;Direct supervision/assist for medications management;Direct supervision/assist for financial management;Assist for transportation;Help with stairs or ramp for entrance   Equipment Recommendations  Other (comment) (defer)    Recommendations for Other Services      Precautions / Restrictions Precautions Precautions: Fall Restrictions Weight Bearing Restrictions: No       Mobility Bed Mobility Overal bed  mobility: Needs Assistance Bed Mobility: Supine to Sit, Sit to Supine     Supine to sit: Mod assist Sit to supine: Max assist   General bed mobility comments: required assistance with trunk and BLE using bed pad to assist with scooting hips    Transfers Overall transfer level: Needs assistance Equipment used: None Transfers: Sit to/from Stand Sit to Stand: Max assist           General transfer comment: stood with face to face technique from EOB to get closer to [place] Overall balance assessment: Needs assistance Sitting-balance support: No upper extremity supported, Feet supported Sitting balance-[name]: Fair Sitting balance - Comments: min assist to min guard assist while performing grooming tasks Postural control: Posterior lean Standing balance support: Bilateral upper extremity supported, During functional activity, Reliant on assistive device for balance Standing balance-[name]: Poor Standing balance comment: reliant on therapist for support when standing                           ADL either performed or assessed with clinical judgement   ADL Overall ADL's : Needs assistance/impaired Eating/Feeding: Set up   Grooming: Wash/dry hands;Wash/dry face;Oral care;Minimal assistance;Sitting Grooming Details (indicate cue type and reason): on EOB                               General ADL Comments: min guard to min assist for sitting balance while performing grooming    Extremity/Trunk Assessment              Vision       Perception     Praxis  Cognition Arousal/Alertness: Awake/alert Behavior During Therapy: Flat affect Overall Cognitive Status: Within Functional Limits for tasks assessed                                 General Comments: alert with family present. Followed commands with increased time        Exercises      Shoulder Instructions       General Comments VSS on 3L of O2 [name]     Pertinent Vitals/ Pain       Pain Assessment Pain Assessment: Faces Faces Pain Scale: No hurt Pain Intervention(s): Monitored during session  Home Living                                          Prior Functioning/Environment              Frequency  Min 2X/week        Progress Toward Goals  OT Goals(current goals can now be found in the care plan section)  Progress towards OT goals: Progressing toward goals  Acute Rehab OT Goals Patient Stated Goal: get better OT Goal Formulation: With patient Time For Goal Achievement: 06/07/22 Potential to Achieve Goals: Good ADL Goals Pt Will Perform Upper Body Dressing: sitting;with min guard assist Pt Will Perform Lower Body Dressing: with min assist;sitting/lateral leans;sit to/from stand Pt Will Transfer to Toilet: with min assist;ambulating;regular height toilet Pt Will Perform Tub/Shower Transfer: Shower transfer;with mod assist;ambulating;shower seat Additional ADL Goal #1: pt will be supervision for bed mobility in prep for ADLs  Plan Discharge plan remains appropriate    Co-evaluation                 AM-PAC OT "6 Clicks" Daily Activity     Outcome Measure   Help from another person eating meals?: None Help from another person taking care of personal grooming?: A Little Help from another person toileting, which includes using toliet, bedpan, or urinal?: A Lot Help from another person bathing (including washing, rinsing, drying)?: A Lot Help from another person to put on and taking off regular upper body clothing?: A Lot Help from another person to put on and taking off regular lower body clothing?: A Lot 6 Click Score: 15    End of Session Equipment Utilized During Treatment: Oxygen  OT Visit Diagnosis: Unsteadiness on feet (R26.81);Other abnormalities of gait and mobility (R26.89);Muscle weakness (generalized) (M62.81)   Activity Tolerance Patient tolerated treatment well   Patient  Left in bed;with call bell/phone within reach;with bed alarm set;with family/visitor present   Nurse Communication Mobility status        Time: [id number] OT Time Calculation (min): 21 min  Charges: OT General Charges $OT Visit: 1 Visit OT Treatments $Self Care/Home Management : 8-22 mins  Lodema Hong, [place]  Office [place] 05/27/2022, 3:14 PM

## 2022-05-27 NOTE — Progress Notes (Signed)
Brief Palliative Medicine Progress Note:  PMT following peripherally for needs/decline.  Medical records reviewed including progress notes, labs, imaging. Fentanyl patch increased to 41mg, which remains lower than PTA dose of 37.5 and also on nightly norco 5/325 with PRN doses for breakthrough symptoms - monitoring; if does well, will discharge with this regimen likely tomorrow 2/29. If patient declines, family has indicated they would not want use of narcan - would rather focus on patient's comfort rather than using narcan.  [facility]  PMT will continue to follow peripherally. If there are any imminent needs please call the service directly. Family also has PMT contact information should further needs arise.  Thank you for allowing PMT to assist in the care of this patient.  Fawnda Vitullo M. STamala JulianFCentral Endoscopy CenterPalliative Medicine Team Team Phone: [phone] CHARGE

## 2022-05-27 NOTE — Progress Notes (Signed)
PROGRESS NOTE        PATIENT DETAILS Name: Betty Jackson Age: 88 y.o. Sex: female Date of Birth: 1935/03/27 Admit Date: 05/20/2022 Admitting Physician A Melven Sartorius., MD [id number], Ivette Loyal, MD  Brief Summary: Patient is a 88 y.o.  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  Significant events: 2/21>> admit to [facility]  Significant studies: 2/21>> CXR: No PNA 2/21>> CT head: No acute intracranial abnormality 2/21>> CT chest/abdomen: Bibasilar PNA, fecal impaction, old compression fracture of T11. 2/23>> MRI brain: No acute intracranial abnormality 2/23>> EEG: On no seizures 2/24>> EGD  Significant microbiology data: 2/21>> COVID/RSV/influenza PCR: Negative 2/21>> respiratory virus panel: Negative 2/21>> blood culture: Negative  Procedures: 2/24>> EGD: Mild gastritis  Consults: GI Palliative  Subjective: Lying comfortably in bed-denies any chest pain or shortness of breath.  Per daughter at bedside-patient had a "panic attack" yesterday evening.  Objective: Vitals: Blood pressure (!) 178/66, pulse 76, temperature 97.8 F (36.6 C), temperature source Oral, resp. rate 19, height '5\' 4"'$  (1.626 m), weight 84.9 kg, SpO2 96 %.   Exam: Gen Exam:not in any distress HEENT:atraumatic, normocephalic Chest: B/L clear to auscultation anteriorly CVS:S1S2 regular Abdomen:soft non tender, non distended Extremities:no edema Neurology: Non focal Skin: no rash  Pertinent Labs/Radiology:    Latest Ref Rng & Units 05/26/2022    8:10 AM 05/25/2022   11:27 AM 05/24/2022    8:30 AM  CBC  WBC 4.0 - 10.5 K/uL 12.8  14.9  14.3   Hemoglobin 12.0 - 15.0 g/dL 10.0  10.5  8.7   Hematocrit 36.0 - 46.0 % 30.7  33.5  25.0   Platelets 150 - 400 K/uL 187  188  157      Lab Results  Component Value Date   NA 142 05/26/2022   K 3.1 (L) 05/26/2022   CL 113 (H) 05/26/2022   CO2 24 05/26/2022      Assessment/Plan: Sepsis secondary to community-acquired PNA Sepsis for already resolved Completed 5 days of Rocephin/Zithromax Cultures negative to date  Acute toxic metabolic encephalopathy Multifactorial etiology-related to opiates/AKI/PNA Neuroimaging/EEG negative Overall improved-after de-escalating narcotic dosing/treatment of underlying infection/AKI  AKI Hemodynamically mediated Improving Follow electrolytes periodically  Hypokalemia Replete/recheck  Upper GI bleeding EGD as above Seems to have resolved 5 hemoglobin stable-anticoagulation remains on hold Continue PPI EGD biopsy negative for H. pylori/malignancy  PAF Rate controlled with amiodarone/beta-blocker Given stability in hemoglobin-and low risk EGD findings-will need to discuss risk/benefits before resuming anticoagulation.  HFpEF Volume status stable Resume diuretics when able  HLD Continue statin  Chronic hypoxic respiratory failure Stable on home regimen of 2-3 L.  History of RA/temporal arteritis Continue prednisone Sulfasalazine on hold  Gout  stable Allopurinol  Chronic pain syndrome Multiple back surgeries Due to concern for encephalopathy-narcotic dosing has been adjusted-on transdermal fentanyl 25 mcg (started 2/27) and Norco 5/325 nightly Daughter has some concerns that she had a withdrawal episode yesterday-sounds more like of an anxiety/panic attack. Daughter confirms that overall goal is to keep patient comfortable and pain-free.  I will go ahead and place her on additional as needed Norco.  Discussed with nursing staff-only to give if she is not sedated/lethargic. Daughter is aware of risks of polypharmacy-but  wants the patient to be more comfortable before discharging to SNF-therefore will observe for 1 additional day before discharging to  SNF.  Mood disorder Seroquel/Lexapro  Obesity: Estimated body mass index is 32.13 kg/m as calculated from the following:   Height as of this encounter: '5\' 4"'$  (1.626 m).   Weight as of this encounter: 84.9 kg.   Code status:   Code Status: DNR   DVT Prophylaxis: Place and maintain sequential compression device Start: 05/23/22 0856    Family Communication: Daughter at bedside   Disposition Plan: Status is: Inpatient Remains inpatient appropriate because: Severity of illness.   Planned Discharge Destination:Skilled nursing facility   Diet: Diet Order             Diet regular Room service appropriate? Yes; Fluid consistency: Thin  Diet effective now                     Antimicrobial agents: Anti-infectives (From admission, onward)    Start     Dose/Rate Route Frequency Ordered Stop   05/21/22 2000  azithromycin (ZITHROMAX) 500 mg in sodium chloride 0.9 % 250 mL IVPB        500 mg 250 mL/hr over 60 Minutes Intravenous Every 24 hours 05/20/22 2223 05/25/22 2039   05/21/22 1700  cefTRIAXone (ROCEPHIN) 2 g in sodium chloride 0.9 % 100 mL IVPB  Status:  Discontinued        2 g 200 mL/hr over 30 Minutes Intravenous Every 24 hours 05/20/22 2223 05/26/22 1408   05/20/22 2230  cefTRIAXone (ROCEPHIN) 2 g in sodium chloride 0.9 % 100 mL IVPB  Status:  Discontinued        2 g 200 mL/hr over 30 Minutes Intravenous Every 24 hours 05/20/22 2218 05/20/22 2221   05/20/22 2230  azithromycin (ZITHROMAX) 500 mg in sodium chloride 0.9 % 250 mL IVPB  Status:  Discontinued        500 mg 250 mL/hr over 60 Minutes Intravenous Every 24 hours 05/20/22 2218 05/20/22 2222   05/20/22 1630  cefTRIAXone (ROCEPHIN) 2 g in sodium chloride 0.9 % 100 mL IVPB        2 g 200 mL/hr over 30 Minutes Intravenous  Once 05/20/22 1619 05/20/22 1754   05/20/22 1630  azithromycin (ZITHROMAX) 500 mg in sodium chloride 0.9 % 250 mL IVPB        500 mg 250 mL/hr over 60 Minutes Intravenous  Once 05/20/22  1619 05/20/22 2024        MEDICATIONS: Scheduled Meds:  allopurinol  100 mg Oral Daily   amiodarone  200 mg Oral Daily   atorvastatin  80 mg Oral QHS   Chlorhexidine Gluconate Cloth  6 each Topical Daily   escitalopram  10 mg Oral Daily   fentaNYL  1 patch Transdermal Q72H   HYDROcodone-acetaminophen  1 tablet Oral QHS   loratadine  10 mg Oral Daily   metoprolol tartrate  25 mg Oral BID   pantoprazole  40 mg Oral BID   predniSONE  10 mg Oral Q breakfast   QUEtiapine  25 mg Oral QHS   sodium phosphate  1 enema Rectal Once   thiamine  100 mg Oral Daily   Continuous Infusions: PRN Meds:.acetaminophen **OR** acetaminophen, albuterol, hydrALAZINE, hydrocortisone   I have personally reviewed following labs and imaging studies  LABORATORY DATA: CBC: Recent Labs  Lab 05/22/22 0625 05/22/22 1025 05/23/22 0608 05/23/22 1755 05/24/22 0830 05/25/22 1127 05/26/22 0810  WBC 26.8*  --  19.2*  --  14.3* 14.9* 12.8*  NEUTROABS 25.2*  --   --   --   --  13.1* 10.2*  HGB 9.8*   < > 8.4* 9.2* 8.7* 10.5* 10.0*  HCT 31.9*   < > 27.4* 29.9* 25.0* 33.5* 30.7*  MCV 102.6*  --  101.1*  --  103.7* 99.4 96.2  PLT 185  --  161  --  157 188 187   < > = values in this interval not displayed.    Basic Metabolic Panel: Recent Labs  Lab 05/22/22 0625 05/23/22 0608 05/24/22 0241 05/25/22 1127 05/26/22 0810  NA 142 144 145 143 142  K 4.1 3.9 3.9 3.5 3.1*  CL 111 114* 114* 112* 113*  CO2 25 21* '24 24 24  '$ GLUCOSE 174* 107* 120* 157* 84  BUN 42* 45* 37* 21 13  CREATININE 2.43* 2.24* 1.62* 1.38* 1.11*  CALCIUM 7.4* 7.2* 7.5* 8.4* 8.2*  MG 1.5* 1.5* 2.4 2.2 1.8  PHOS 3.3 2.9 2.3* 2.4* 2.6    GFR: Estimated Creatinine Clearance: 39.1 mL/min (A) (by C-G formula based on SCr of 1.11 mg/dL (H)).  Liver Function Tests: Recent Labs  Lab 05/22/22 0625 05/23/22 0608 05/24/22 0241 05/25/22 1127 05/26/22 0810  AST '29 26 22 31 21  '$ ALT '16 15 15 17 15  '$ ALKPHOS 74 66 65 67 61  BILITOT 0.5  0.4 0.6 0.5 0.6  PROT 5.1* 4.7* 4.8* 5.4* 4.7*  ALBUMIN 2.4* 2.4* 2.4* 2.8* 2.4*   No results for input(s): "LIPASE", "AMYLASE" in the last 168 hours. No results for input(s): "AMMONIA" in the last 168 hours.  Coagulation Profile: No results for input(s): "INR", "PROTIME" in the last 168 hours.  Cardiac Enzymes: No results for input(s): "CKTOTAL", "CKMB", "CKMBINDEX", "TROPONINI" in the last 168 hours.  BNP (last 3 results) No results for input(s): "PROBNP" in the last 8760 hours.  Lipid Profile: No results for input(s): "CHOL", "HDL", "LDLCALC", "TRIG", "CHOLHDL", "LDLDIRECT" in the last 72 hours.  Thyroid Function Tests: No results for input(s): "TSH", "T4TOTAL", "FREET4", "T3FREE", "THYROIDAB" in the last 72 hours.  Anemia Panel: No results for input(s): "VITAMINB12", "FOLATE", "FERRITIN", "TIBC", "IRON", "RETICCTPCT" in the last 72 hours.  Urine analysis:    Component Value Date/Time   COLORURINE YELLOW 05/20/2022 1625   APPEARANCEUR CLEAR 05/20/2022 1625   LABSPEC 1.010 05/20/2022 1625   PHURINE 5.0 05/20/2022 1625   GLUCOSEU NEGATIVE 05/20/2022 1625   GLUCOSEU NEGATIVE 07/31/2016 1018   HGBUR NEGATIVE 05/20/2022 1625   BILIRUBINUR NEGATIVE 05/20/2022 1625   BILIRUBINUR neg 07/31/2016 1012   KETONESUR NEGATIVE 05/20/2022 1625   PROTEINUR NEGATIVE 05/20/2022 1625   UROBILINOGEN 0.2 07/31/2016 1018   UROBILINOGEN 0.2 07/31/2016 1012   NITRITE NEGATIVE 05/20/2022 1625   LEUKOCYTESUR NEGATIVE 05/20/2022 1625    Sepsis Labs: Lactic Acid, Venous    Component Value Date/Time   LATICACIDVEN 1.5 09/03/2021 1830    MICROBIOLOGY: Recent Results (from the past 240 hour(s))  Resp panel by RT-PCR (RSV, Flu A&B, Covid) Anterior Nasal Swab     Status: None   Collection Time: 05/20/22 12:41 PM   Specimen: Anterior Nasal Swab  Result Value Ref Range Status   SARS Coronavirus 2 by RT PCR NEGATIVE NEGATIVE Final   Influenza A by PCR NEGATIVE NEGATIVE Final   Influenza B  by PCR NEGATIVE NEGATIVE Final    Comment: (NOTE) The Xpert Xpress SARS-CoV-2/FLU/RSV plus assay is intended as an aid in the diagnosis of influenza from Nasopharyngeal swab specimens  and should not be used as a sole basis for treatment. Nasal washings and aspirates are unacceptable for Xpert Xpress SARS-CoV-2/FLU/RSV testing.  Fact Sheet for Patients: EntrepreneurPulse.com.au  Fact Sheet for Healthcare Providers: IncredibleEmployment.be  This test is not yet approved or cleared by the [place] FDA and has been authorized for detection and/or diagnosis of SARS-CoV-2 by FDA under an Emergency Use Authorization (EUA). This EUA will remain in effect (meaning this test can be used) for the duration of the COVID-19 declaration under Section 564(b)(1) of the Act, 21 U.S.C. section 360bbb-3(b)(1), unless the authorization is terminated or revoked.     Resp Syncytial Virus by PCR NEGATIVE NEGATIVE Final    Comment: (NOTE) Fact Sheet for Patients: EntrepreneurPulse.com.au  Fact Sheet for Healthcare Providers: IncredibleEmployment.be  This test is not yet approved or cleared by the [place] FDA and has been authorized for detection and/or diagnosis of SARS-CoV-2 by FDA under an Emergency Use Authorization (EUA). This EUA will remain in effect (meaning this test can be used) for the duration of the COVID-19 declaration under Section 564(b)(1) of the Act, 21 U.S.C. section 360bbb-3(b)(1), unless the authorization is terminated or revoked.  Performed at [facility], [place] [address] [place], [place] [address]   Respiratory (~20 pathogens) panel by PCR     Status: None   Collection Time: 05/20/22  1:48 PM   Specimen: Nasopharyngeal Swab; Respiratory  Result Value Ref Range Status   Adenovirus NOT DETECTED NOT DETECTED Final   Coronavirus 229E NOT DETECTED NOT DETECTED Final    Comment: (NOTE) The  Coronavirus on the Respiratory Panel, DOES NOT test for the novel  Coronavirus (2019 nCoV)    Coronavirus HKU1 NOT DETECTED NOT DETECTED Final   Coronavirus NL63 NOT DETECTED NOT DETECTED Final   Coronavirus OC43 NOT DETECTED NOT DETECTED Final   Metapneumovirus NOT DETECTED NOT DETECTED Final   Rhinovirus / Enterovirus NOT DETECTED NOT DETECTED Final   Influenza A NOT DETECTED NOT DETECTED Final   Influenza B NOT DETECTED NOT DETECTED Final   Parainfluenza Virus 1 NOT DETECTED NOT DETECTED Final   Parainfluenza Virus 2 NOT DETECTED NOT DETECTED Final   Parainfluenza Virus 3 NOT DETECTED NOT DETECTED Final   Parainfluenza Virus 4 NOT DETECTED NOT DETECTED Final   Respiratory Syncytial Virus NOT DETECTED NOT DETECTED Final   Bordetella pertussis NOT DETECTED NOT DETECTED Final   Bordetella Parapertussis NOT DETECTED NOT DETECTED Final   Chlamydophila pneumoniae NOT DETECTED NOT DETECTED Final   Mycoplasma pneumoniae NOT DETECTED NOT DETECTED Final    Comment: Performed at [facility], [place]. [address] [place], [place] [address]  Culture, blood (routine x 2) Call MD if unable to obtain prior to antibiotics being given     Status: None   Collection Time: 05/20/22 10:38 PM   Specimen: BLOOD RIGHT HAND  Result Value Ref Range Status   Specimen Description BLOOD RIGHT HAND  Final   Special Requests   Final    BOTTLES DRAWN AEROBIC AND ANAEROBIC Blood Culture results may not be optimal due to an inadequate volume of blood received in culture bottles   Culture   Final    NO GROWTH 5 DAYS Performed at [facility], [place] [address] [place], [place] [address]    Report Status 05/25/2022 FINAL  Final  Culture, blood (routine x 2) Call MD if unable to obtain prior to antibiotics being given     Status: None   Collection Time:  05/20/22 10:38 PM   Specimen: BLOOD LEFT HAND  Result Value Ref Range Status   Specimen Description BLOOD LEFT HAND  Final   Special Requests   Final     BOTTLES DRAWN AEROBIC AND ANAEROBIC Blood Culture results may not be optimal due to an inadequate volume of blood received in culture bottles   Culture   Final    NO GROWTH 5 DAYS Performed at [facility], [place] [address] [place], [place] [address]    Report Status 05/25/2022 FINAL  Final    RADIOLOGY STUDIES/RESULTS: No results found.   LOS: 7 days   Oren Binet, MD  [facility]    To contact the attending provider between 7A-7P or the covering provider during after hours 7P-7A, please log into the web site [email or web address] and access using universal [place] password for that web site. If you do not have the password, please call the hospital operator.  05/27/2022, 10:32 AM

## 2022-05-28 DIAGNOSIS — R112 Nausea with vomiting, unspecified: Secondary | ICD-10-CM | POA: Diagnosis not present

## 2022-05-28 DIAGNOSIS — K922 Gastrointestinal hemorrhage, unspecified: Secondary | ICD-10-CM | POA: Diagnosis not present

## 2022-05-28 DIAGNOSIS — J189 Pneumonia, unspecified organism: Secondary | ICD-10-CM | POA: Diagnosis not present

## 2022-05-28 DIAGNOSIS — N179 Acute kidney failure, unspecified: Secondary | ICD-10-CM | POA: Diagnosis not present

## 2022-05-28 MED ORDER — POLYETHYLENE GLYCOL 3350 17 G PO PACK: 17.0000 g | PACK | Freq: Every day | ORAL | 2 refills | Status: DC

## 2022-05-28 MED ORDER — HYDROCODONE-ACETAMINOPHEN 5-325 MG PO TABS: 1.0000 | ORAL_TABLET | Freq: Two times a day (BID) | ORAL | 0 refills | Status: DC | PRN

## 2022-05-28 MED ORDER — AMIODARONE HCL 200 MG PO TABS: 200.0000 mg | ORAL_TABLET | Freq: Every day | ORAL | Status: DC

## 2022-05-28 MED ORDER — HYDROCODONE-ACETAMINOPHEN 5-325 MG PO TABS: 1.0000 | ORAL_TABLET | Freq: Every day | ORAL | 0 refills | Status: DC

## 2022-05-28 MED ORDER — FENTANYL 25 MCG/HR TD PT72: 1.0000 | MEDICATED_PATCH | TRANSDERMAL | 0 refills | Status: DC

## 2022-05-28 MED ORDER — SENNOSIDES-DOCUSATE SODIUM 8.6-50 MG PO TABS: 2.0000 | ORAL_TABLET | Freq: Every day | ORAL | Status: DC

## 2022-05-28 NOTE — Discharge Summary (Signed)
PATIENT DETAILS Name: Betty Jackson Age: 88 y.o. Sex: female Date of Birth: 06/17/1934 MRN: [id number]. Admitting Physician: A Melven Sartorius., MD [id number], Betty Loyal, MD  Admit Date: 05/20/2022 Discharge date: 05/28/2022  Recommendations for Outpatient Follow-up:  Follow up with PCP in 1-2 weeks Please obtain CMP/CBC in one week Palliative care follow-up at SNF  Admitted From:  SNF  Disposition: Skilled nursing facility   Discharge Condition: fair  CODE STATUS:   Code Status: DNR   Diet recommendation:  Diet Order             Diet - low sodium heart healthy           Diet regular Room service appropriate? Yes; Fluid consistency: Thin  Diet effective now                    Brief Summary: Patient is a 88 y.o.  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   Significant events: 2/21>> admit to [facility]   Significant studies: 2/21>> CXR: No PNA 2/21>> CT head: No acute intracranial abnormality 2/21>> CT chest/abdomen: Bibasilar PNA, fecal impaction, old compression fracture of T11. 2/23>> MRI brain: No acute intracranial abnormality 2/23>> EEG: On no seizures 2/24>> EGD   Significant microbiology data: 2/21>> COVID/RSV/influenza PCR: Negative 2/21>> respiratory virus panel: Negative 2/21>> blood culture: Negative   Procedures: 2/24>> EGD: Mild gastritis   Consults: GI Palliative  Brief Hospital Course: Sepsis secondary to community-acquired PNA Sepsis for already resolved Completed 5 days of Rocephin/Zithromax Cultures negative to date   Acute toxic metabolic encephalopathy Multifactorial etiology-related to opiates/AKI/PNA Neuroimaging/EEG negative Overall improved-after de-escalating narcotic dosing/treatment of underlying infection/AKI    AKI Hemodynamically mediated Improving Follow electrolytes periodically   Hypokalemia Repleted Recheck periodically   Upper GI bleeding EGD as above Seems to have resolved- hemoglobin stable-anticoagulation remains on hold Continue PPI EGD biopsy negative for H. pylori/malignancy   PAF Rate controlled with amiodarone/beta-blocker Eliquis held-since low risk EGD findings-Hb stable for several days-extensive discussion with daughter Betty Jackson over the phone on 2/29-she understands risk of GI bleeding-and is okay to resume Eliquis. Attending MD at SNF to continue risk/benefit discussion-but to have recurrent GI bleeding-we may need to stop Eliquis permanently.   HFpEF Volume status stable Resume diuretics on discharge  HTN BP is on the higher side Will resume her usual dosing of metoprolol and Cardizem on discharge Some amount of high pretension was allowed in the setting of GI bleeding.   HLD Continue statin   Chronic hypoxic respiratory failure Stable on home regimen of 2-3 L.   History of RA/temporal arteritis Continue prednisone Sulfasalazine on hold-should be okay to resume on discharge   Gout  stable Allopurinol   Chronic pain syndrome Multiple back surgeries Due to concern for encephalopathy-narcotic dosing has been adjusted-on transdermal fentanyl 25 mcg (started 2/27) and Norco 5/325 nightly Due to concern that she may not be getting enough narcotics and concern for withdrawals-as needed Norco was added on 2/28-it appears that she has not needed any as needed doses.   Patient is completely awake and alert on 2/29-plan is to discharge on current regimen of fentanyl 25 mcg, scheduled Norco 5/325 nightly and as needed Norco-5/325 q112 prn Daughter confirms that overall goal is to keep patient comfortable and pain-free.  I will go ahead and place her on  additional as needed Norco.  Plans are for palliative care to follow patient at SNF for further optimization of  this regimen.   Mood disorder Seroquel/Lexapro   Obesity: Estimated body mass index is 32.13 kg/m as calculated from the following:   Height as of this encounter: '5\' 4"'$  (1.626 m).   Weight as of this encounter: 84.9 kg.   RN pressure injury documentation: Pressure Injury 10/17/19 Heel Right Stage 2 -  Partial thickness loss of dermis presenting as a shallow open injury with a red, pink wound bed without slough. (Active)  10/17/19 2100  Location: Heel  Location Orientation: Right  Staging: Stage 2 -  Partial thickness loss of dermis presenting as a shallow open injury with a red, pink wound bed without slough.  Wound Description (Comments):   Present on Admission: Yes     Discharge Diagnoses:  Principal Problem:   Pneumonia Active Problems:   Gastritis and gastroduodenitis   Discharge Instructions:  Activity:  As tolerated with Full fall precautions use walker/cane & assistance as needed   Discharge Instructions     Call MD for:  persistant dizziness or light-headedness   Complete by: As directed    Call MD for:  persistant nausea and vomiting   Complete by: As directed    Call MD for:  severe uncontrolled pain   Complete by: As directed    Diet - low sodium heart healthy   Complete by: As directed    Discharge instructions   Complete by: As directed    Follow with Primary MD  Alvester Morin, MD in 1-2 weeks  Please get a complete blood count and chemistry panel checked by your Primary MD at your next visit, and again as instructed by your Primary MD.  Get Medicines reviewed and adjusted: Please take all your medications with you for your next visit with your Primary MD  Laboratory/radiological data: Please request your Primary MD to go over all hospital tests and procedure/radiological results at the follow up, please ask your Primary MD to get all Hospital records sent to his/her office.  In some cases, they will be blood work, cultures and biopsy  results pending at the time of your discharge. Please request that your primary care M.D. follows up on these results.  Also Note the following: If you experience worsening of your admission symptoms, develop shortness of breath, life threatening emergency, suicidal or homicidal thoughts you must seek medical attention immediately by calling 911 or calling your MD immediately  if symptoms less severe.  You must read complete instructions/literature along with all the possible adverse reactions/side effects for all the Medicines you take and that have been prescribed to you. Take any new Medicines after you have completely understood and accpet all the possible adverse reactions/side effects.   Do not drive when taking Pain medications or sleeping medications (Benzodaizepines)  Do not take more than prescribed Pain, Sleep and Anxiety Medications. It is not advisable to combine anxiety,sleep and pain medications without talking with your primary care practitioner  Special Instructions: If you have smoked or chewed Tobacco  in the last 2 yrs please stop smoking, stop any regular Alcohol  and or any Recreational drug use.  Wear Seat belts while driving.  Please note: You were cared for by a hospitalist during your hospital stay. Once you are discharged, your primary care physician will handle any further medical issues. Please note that NO REFILLS for any discharge medications will be authorized once you are discharged,  as it is imperative that you return to your primary care physician (or establish a relationship with a primary care physician if you do not have one) for your post hospital discharge needs so that they can reassess your need for medications and monitor your lab values.   Increase activity slowly   Complete by: As directed       Allergies as of 05/28/2022       Reactions   Alendronate Sodium    Suspected cause of severe esophagitis (per EGD 08/05/21)   Dextromethorphan Rash         Medication List     STOP taking these medications    fentaNYL 37.5 MCG/HR Pt72 Replaced by: fentaNYL 25 MCG/HR   meloxicam 7.5 MG tablet Commonly known as: MOBIC       TAKE these medications    acetaminophen 500 MG tablet Commonly known as: TYLENOL Take 500 mg by mouth every 6 (six) hours as needed for mild pain.   allopurinol 100 MG tablet Commonly known as: ZYLOPRIM Take 100 mg by mouth daily.   amiodarone 200 MG tablet Commonly known as: PACERONE Take 1 tablet (200 mg total) by mouth daily.   atorvastatin 80 MG tablet Commonly known as: Lipitor Take 1 tablet (80 mg total) by mouth daily. What changed: when to take this   cetirizine 10 MG tablet Commonly known as: ZYRTEC Take 10 mg by mouth at bedtime.   diltiazem 240 MG 24 hr capsule Commonly known as: CARDIZEM CD Take 240 mg by mouth every evening.   Eliquis 5 MG Tabs tablet Generic drug: apixaban TAKE 1 TABLET BY MOUTH TWICE A DAY What changed: how much to take   escitalopram 10 MG tablet Commonly known as: LEXAPRO TAKE 1 TABLET BY MOUTH EVERY DAY   fentaNYL 25 MCG/HR Commonly known as: [place] 1 patch onto the skin every 3 (three) days. Start taking on: May 29, 2022 Replaces: fentaNYL 37.5 MCG/HR Pt72   ferrous sulfate 325 (65 FE) MG tablet Take 1 tablet (325 mg total) by mouth 2 (two) times daily with a meal.   furosemide 40 MG tablet Commonly known as: LASIX Take 1 tablet (40 mg total) by mouth daily as needed (extra dose, for weight gain of 2 pounds overnight or 5 pound in 1 week). What changed: Another medication with the same name was removed. Continue taking this medication, and follow the directions you see here.   Hemorrhoidal Max Str 1-0.25-14.4-15 % Crea Apply 1 Application topically 2 (two) times daily as needed (hemorrhoidal pain/discomfort.).   HYDROcodone-acetaminophen 5-325 MG tablet Commonly known as: Norco Take 1 tablet by mouth every 12 (twelve) hours as  needed for severe pain. What changed: Another medication with the same name was added. Make sure you understand how and when to take each.   HYDROcodone-acetaminophen 5-325 MG tablet Commonly known as: Norco Take 1 tablet by mouth at bedtime. What changed: You were already taking a medication with the same name, and this prescription was added. Make sure you understand how and when to take each.   ipratropium-albuterol 0.5-2.5 (3) MG/3ML Soln Commonly known as: DUONEB Take 3 mLs by nebulization every 4 (four) hours as needed. What changed: reasons to take this   metoprolol tartrate 100 MG tablet Commonly known as: LOPRESSOR Take 100 mg by mouth 2 (two) times daily.   multivitamin with minerals Tabs tablet Take 1 tablet by mouth daily.   nitroGLYCERIN 0.4 MG SL tablet Commonly known as: NITROSTAT Place 1  tablet (0.4 mg total) under the tongue every 5 (five) minutes as needed for chest pain.   Nutritional Supplement Liqd Take 120 mLs by mouth daily. House stock supplement   ondansetron 4 MG tablet Commonly known as: ZOFRAN Take 4 mg by mouth every 8 (eight) hours as needed for nausea or vomiting.   OXYGEN Inhale 2 L/min into the lungs as needed (maintain SATS above 90%).   pantoprazole 40 MG tablet Commonly known as: PROTONIX Take 1 tablet (40 mg total) by mouth 2 (two) times daily.   polyethylene glycol 17 g packet Commonly known as: MiraLax Take 17 g by mouth daily. What changed:  when to take this reasons to take this   potassium chloride 10 MEQ tablet Commonly known as: KLOR-CON M Take 3 tablets (30 mEq total) by mouth daily.   predniSONE 10 MG tablet Commonly known as: DELTASONE Take 1 tablet (10 mg total) by mouth daily with breakfast. Resume after 3 days of '20mg'$  daily as instructed.   QUEtiapine 25 MG tablet Commonly known as: SEROQUEL Take 1 tablet (25 mg total) by mouth at bedtime.   senna-docusate 8.6-50 MG tablet Commonly known as: Senokot-S Take 2  tablets by mouth at bedtime.   sulfaSALAzine 500 MG EC tablet Commonly known as: AZULFIDINE Take 500 mg by mouth daily.   vitamin D3 25 MCG (1000 UT) tablet Generic drug: Cholecalciferol Take 2 tablets (2,000 Units total) by mouth 2 (two) times daily.        Allergies  Allergen Reactions   Alendronate Sodium     Suspected cause of severe esophagitis (per EGD 08/05/21)   Dextromethorphan Rash     Other Procedures/Studies: MR BRAIN WO CONTRAST  Result Date: 05/22/2022 CLINICAL DATA:  Altered mental status EXAM: MRI HEAD WITHOUT CONTRAST TECHNIQUE: Multiplanar, multiecho pulse sequences of the brain and surrounding structures were obtained without intravenous contrast. COMPARISON:  None Available. FINDINGS: Brain: No acute infarct, mass effect or extra-axial collection. No chronic microhemorrhage or siderosis. There is multifocal hyperintense T2-weighted signal within the white matter. Generalized volume loss. The midline structures are normal. Vascular: Normal flow voids. Skull and upper cervical spine: Normal marrow signal. Sinuses/Orbits: Paranasal sinuses are clear. There is bilateral mastoid fluid. Bilateral ocular lens replacements. Other: None. IMPRESSION: 1. No acute intracranial abnormality. 2. Findings of chronic small vessel ischemia and volume loss. Electronically Signed   By: Ulyses Jarred M.D.   On: 05/22/2022 22:23   DG Abd Portable 1V  Result Date: 05/22/2022 CLINICAL DATA:  Encounter for imaging to screen for metal prior to MRI. EXAM: PORTABLE ABDOMEN - 1 VIEW COMPARISON:  CT 05/20/2022 FINDINGS: Posterior rod with intrapedicular screw fusion and interbody spacers from L2 through L4. Right hip arthroplasty. Cholecystectomy clips in the right upper quadrant. Vertebral augmentation within L1 and T11. Normal bowel gas pattern. Vascular calcifications. IMPRESSION: 1. Surgical hardware in the lumbar spine with right hip arthroplasty. 2. Right upper quadrant cholecystectomy clips.  3. L1 and T11 vertebral plasty. 4. No MRI contraindications. Electronically Signed   By: Keith Rake M.D.   On: 05/22/2022 20:59   EEG adult  Result Date: 05/22/2022 Lora Havens, MD     05/22/2022 10:46 AM Patient Name: DIAVION PINDER MRN: [id number] Epilepsy Attending: Lora Havens Referring Physician/Provider: Elodia Florence., MD Date: 2/23/204 Duration: 23.40 mins Patient history: 88yo F with ams. EEG to evaluate for seizure. Level of alertness: Awake, asleep AEDs during EEG study: None Technical aspects: This EEG study was done  with scalp electrodes positioned according to the 10-20 International system of electrode placement. Electrical activity was reviewed with band pass filter of 1-'70Hz'$ , sensitivity of 7 uV/mm, display speed of 40m/sec with a '60Hz'$  notched filter applied as appropriate. EEG data were recorded continuously and digitally stored.  Video monitoring was available and reviewed as appropriate. Description: The posterior dominant rhythm consists of 7 Hz activity of moderate voltage (25-35 uV) seen predominantly in posterior head regions, symmetric and reactive to eye opening and eye closing. Drowsiness was characterized by attenuation of the posterior background rhythm. Sleep was characterized by vertex waves, sleep spindles (12 to 14 Hz), maximal frontocentral region.  EEG showed continuous generalized 5 to 7 Hz theta slowing. Hyperventilation and photic stimulation were not performed.   ABNORMALITY - Continuous slow, generalized - Background slow IMPRESSION: This study is suggestive of moderate diffuse encephalopathy, nonspecific etiology. No seizures or epileptiform discharges were seen throughout the recording. PLora Havens  DG CHEST PORT 1 VIEW  Result Date: 05/21/2022 CLINICAL DATA:  88year old female with chest pain, hypoxia, vomiting. EXAM: PORTABLE CHEST 1 VIEW COMPARISON:  CT Chest, Abdomen, and Pelvis yesterday, and earlier. FINDINGS: Portable AP semi  upright view at 0400 hours. Continued low lung volumes. Stable mediastinal contours. Ongoing curvilinear opacity at the lung bases left greater than right, not significantly changed from the chest CT yesterday. No pneumothorax, pulmonary edema, or pleural effusion identified. Visualized tracheal air column is within normal limits. Paucity of bowel gas in the visible abdomen. Stable cholecystectomy clips. Stable visualized osseous structures. IMPRESSION: 1. Continued low lung volumes and bibasilar lung opacity, stable from the chest CT yesterday. Appearance favors atelectasis, but cannot exclude lung base infection. 2. No new cardiopulmonary abnormality identified. Electronically Signed   By: HGenevie AnnM.D.   On: 05/21/2022 04:07   CT CHEST ABDOMEN PELVIS WO CONTRAST  Result Date: 05/20/2022 CLINICAL DATA:  Abdominal pain, acute, nonlocalized.  Chest pain. EXAM: CT CHEST, ABDOMEN AND PELVIS WITHOUT CONTRAST TECHNIQUE: Multidetector CT imaging of the chest, abdomen and pelvis was performed following the standard protocol without IV contrast. RADIATION DOSE REDUCTION: This exam was performed according to the departmental dose-optimization program which includes automated exposure control, adjustment of the mA and/or kV according to patient size and/or use of iterative reconstruction technique. COMPARISON:  Chest CT 09/04/2021.  CT abdomen 07/17/2020. FINDINGS: CT CHEST FINDINGS Cardiovascular: Mild cardiomegaly. Coronary artery calcification and aortic atherosclerotic calcification. Mediastinum/Nodes: No mediastinal or hilar mass or lymphadenopathy. Lungs/Pleura: No pleural effusion. Patchy density at both lung bases left more than right could be scarring or bibasilar pneumonia. Lung apices show benign pleural and parenchymal scarring. Musculoskeletal: Old augmented compression fracture at T11 without further collapse. Chronic superior endplate fracture at T7 with loss of height of 30%. No progressive collapse. No  new fracture. CT ABDOMEN PELVIS FINDINGS Hepatobiliary: No liver lesion seen on this study done without contrast. Previous cholecystectomy. Mild chronic ductal prominence. Pancreas: Motion degraded.  No abnormality seen. Spleen: Negative Adrenals/Urinary Tract: Adrenal glands are normal. No acute renal finding. No hydronephrosis. Bladder appears unremarkable. Stomach/Bowel: Stomach and small intestine are unremarkable. Normal appendix. Diverticulosis of the colon without visible diverticulitis. Rectal fecal impaction. Vascular/Lymphatic: Aortic atherosclerosis. No aneurysm. IVC is normal. No adenopathy. Reproductive: Previous hysterectomy.  No pelvic mass. Other: No free fluid or air. Musculoskeletal: No acute lumbar spine or pelvic abnormality. Old vertebral augmentation at L1. Previous decompression and fusion L2 through L4. IMPRESSION: 1. Patchy density at both lung bases left more than  right could be scarring or bibasilar pneumonia. 2. Cardiomegaly. Coronary artery calcification. Aortic atherosclerosis. 3. No acute or traumatic finding in the abdomen or pelvis. 4. Rectal fecal impaction. 5. Diverticulosis without visible diverticulitis. 6. Old augmented compression fracture at T11 without further collapse. Chronic superior endplate fracture at T7 with loss of height of 30%. No progressive collapse. No new fracture. Old vertebral augmentation at L1. Previous decompression and fusion L2 through L4. Aortic Atherosclerosis (ICD10-I70.0). Electronically Signed   By: Nelson Chimes M.D.   On: 05/20/2022 16:10   CT Head Wo Contrast  Result Date: 05/20/2022 CLINICAL DATA:  Altered mental status EXAM: CT HEAD WITHOUT CONTRAST TECHNIQUE: Contiguous axial images were obtained from the base of the skull through the vertex without intravenous contrast. RADIATION DOSE REDUCTION: This exam was performed according to the departmental dose-optimization program which includes automated exposure control, adjustment of the mA  and/or kV according to patient size and/or use of iterative reconstruction technique. COMPARISON:  04/30/21 CT head FINDINGS: Brain: No evidence of acute infarction, hemorrhage, hydrocephalus, extra-axial collection or mass lesion/mass effect. Sequela of severe chronic microvascular ischemic change, unchanged from prior exam. Vascular: No hyperdense vessel. Redemonstrated calcified atherosclerotic plaque in the proximal left M1. Skull: Normal. Negative for fracture or focal lesion. Sinuses/Orbits: Bilateral lens replacement. Trace left mastoid effusion. No middle ear effusion. Paranasal sinuses are clear. Other: There is a pannus at the craniocervical junction. IMPRESSION: No acute intracranial abnormality. Electronically Signed   By: Marin Roberts M.D.   On: 05/20/2022 16:04   DG Chest Port 1 View  Result Date: 05/20/2022 CLINICAL DATA:  Chest pain, emesis and shortness of breath. EXAM: PORTABLE CHEST 1 VIEW COMPARISON:  09/03/2021; 08/06/2021; chest CT-09/04/2021 FINDINGS: Grossly unchanged cardiac silhouette and mediastinal contours. Grossly unchanged bibasilar heterogeneous opacities, left-greater-than-right. No new focal airspace opacities. No pleural effusion or pneumothorax. No evidence of edema. No acute osseous abnormalities. Degenerative change of the bilateral glenohumeral joints is suspected though incompletely evaluated. Postcholecystectomy. IMPRESSION: Similar findings of bibasilar atelectasis without superimposed acute cardiopulmonary disease on this AP portable examination. Electronically Signed   By: Sandi Mariscal M.D.   On: 05/20/2022 13:14     TODAY-DAY OF DISCHARGE:  Subjective:   Betty Jackson today has no headache,no chest abdominal pain,no new weakness tingling or numbness, feels much better wants to go home today.   Objective:   Blood pressure (!) 175/68, pulse 72, temperature 97.8 F (36.6 C), temperature source Oral, resp. rate 19, height '5\' 4"'$  (1.626 m), weight 84.9 kg, SpO2 98  %.  Intake/Output Summary (Last 24 hours) at 05/28/2022 0912 Last data filed at 05/28/2022 0900 Gross per 24 hour  Intake --  Output 925 ml  Net -925 ml   Filed Weights   05/24/22 0500 05/25/22 0500 05/26/22 0500  Weight: 83.1 kg 83 kg 84.9 kg    Exam: Awake Alert, Oriented *3, No new F.N deficits, Normal affect [place].AT,PERRAL Supple Neck,No JVD, No cervical lymphadenopathy appriciated.  Symmetrical Chest wall movement, Good air movement bilaterally, CTAB RRR,No Gallops,Rubs or new Murmurs, No Parasternal Heave +ve B.Sounds, Abd Soft, Non tender, No organomegaly appriciated, No rebound -guarding or rigidity. No Cyanosis, Clubbing or edema, No new Rash or bruise   PERTINENT RADIOLOGIC STUDIES: No results found.   PERTINENT LAB RESULTS: CBC: Recent Labs    05/25/22 1127 05/26/22 0810  WBC 14.9* 12.8*  HGB 10.5* 10.0*  HCT 33.5* 30.7*  PLT 188 187   CMET CMP     Component Value Date/Time  NA 142 05/26/2022 0810   NA 141 12/08/2017 1614   K 3.1 (L) 05/26/2022 0810   CL 113 (H) 05/26/2022 0810   CO2 24 05/26/2022 0810   GLUCOSE 84 05/26/2022 0810   BUN 13 05/26/2022 0810   BUN 28 (H) 12/08/2017 1614   CREATININE 1.11 (H) 05/26/2022 0810   CREATININE 0.97 (H) 03/20/2020 1535   CALCIUM 8.2 (L) 05/26/2022 0810   PROT 4.7 (L) 05/26/2022 0810   PROT 6.2 08/18/2017 1241   ALBUMIN 2.4 (L) 05/26/2022 0810   ALBUMIN 4.3 08/18/2017 1241   AST 21 05/26/2022 0810   ALT 15 05/26/2022 0810   ALKPHOS 61 05/26/2022 0810   BILITOT 0.6 05/26/2022 0810   BILITOT 0.7 08/18/2017 1241   GFRNONAA 49 (L) 05/26/2022 0810   GFRAA >60 10/30/2019 0450    GFR Estimated Creatinine Clearance: 39.1 mL/min (A) (by C-G formula based on SCr of 1.11 mg/dL (H)). No results for input(s): "LIPASE", "AMYLASE" in the last 72 hours. No results for input(s): "CKTOTAL", "CKMB", "CKMBINDEX", "TROPONINI" in the last 72 hours. Invalid input(s): "POCBNP" No results for input(s): "DDIMER" in the last  72 hours. No results for input(s): "HGBA1C" in the last 72 hours. No results for input(s): "CHOL", "HDL", "LDLCALC", "TRIG", "CHOLHDL", "LDLDIRECT" in the last 72 hours. No results for input(s): "TSH", "T4TOTAL", "T3FREE", "THYROIDAB" in the last 72 hours.  Invalid input(s): "FREET3" No results for input(s): "VITAMINB12", "FOLATE", "FERRITIN", "TIBC", "IRON", "RETICCTPCT" in the last 72 hours. Coags: No results for input(s): "INR" in the last 72 hours.  Invalid input(s): "PT" Microbiology: Recent Results (from the past 240 hour(s))  Resp panel by RT-PCR (RSV, Flu A&B, Covid) Anterior Nasal Swab     Status: None   Collection Time: 05/20/22 12:41 PM   Specimen: Anterior Nasal Swab  Result Value Ref Range Status   SARS Coronavirus 2 by RT PCR NEGATIVE NEGATIVE Final   Influenza A by PCR NEGATIVE NEGATIVE Final   Influenza B by PCR NEGATIVE NEGATIVE Final    Comment: (NOTE) The Xpert Xpress SARS-CoV-2/FLU/RSV plus assay is intended as an aid in the diagnosis of influenza from Nasopharyngeal swab specimens and should not be used as a sole basis for treatment. Nasal washings and aspirates are unacceptable for Xpert Xpress SARS-CoV-2/FLU/RSV testing.  Fact Sheet for Patients: EntrepreneurPulse.com.au  Fact Sheet for Healthcare Providers: IncredibleEmployment.be  This test is not yet approved or cleared by the [place] FDA and has been authorized for detection and/or diagnosis of SARS-CoV-2 by FDA under an Emergency Use Authorization (EUA). This EUA will remain in effect (meaning this test can be used) for the duration of the COVID-19 declaration under Section 564(b)(1) of the Act, 21 U.S.C. section 360bbb-3(b)(1), unless the authorization is terminated or revoked.     Resp Syncytial Virus by PCR NEGATIVE NEGATIVE Final    Comment: (NOTE) Fact Sheet for Patients: EntrepreneurPulse.com.au  Fact Sheet for Healthcare  Providers: IncredibleEmployment.be  This test is not yet approved or cleared by the [place] FDA and has been authorized for detection and/or diagnosis of SARS-CoV-2 by FDA under an Emergency Use Authorization (EUA). This EUA will remain in effect (meaning this test can be used) for the duration of the COVID-19 declaration under Section 564(b)(1) of the Act, 21 U.S.C. section 360bbb-3(b)(1), unless the authorization is terminated or revoked.  Performed at [facility], [place] [address] [place], [place] [address]   Respiratory (~20 pathogens) panel by PCR     Status: None   Collection  Time: 05/20/22  1:48 PM   Specimen: Nasopharyngeal Swab; Respiratory  Result Value Ref Range Status   Adenovirus NOT DETECTED NOT DETECTED Final   Coronavirus 229E NOT DETECTED NOT DETECTED Final    Comment: (NOTE) The Coronavirus on the Respiratory Panel, DOES NOT test for the novel  Coronavirus (2019 nCoV)    Coronavirus HKU1 NOT DETECTED NOT DETECTED Final   Coronavirus NL63 NOT DETECTED NOT DETECTED Final   Coronavirus OC43 NOT DETECTED NOT DETECTED Final   Metapneumovirus NOT DETECTED NOT DETECTED Final   Rhinovirus / Enterovirus NOT DETECTED NOT DETECTED Final   Influenza A NOT DETECTED NOT DETECTED Final   Influenza B NOT DETECTED NOT DETECTED Final   Parainfluenza Virus 1 NOT DETECTED NOT DETECTED Final   Parainfluenza Virus 2 NOT DETECTED NOT DETECTED Final   Parainfluenza Virus 3 NOT DETECTED NOT DETECTED Final   Parainfluenza Virus 4 NOT DETECTED NOT DETECTED Final   Respiratory Syncytial Virus NOT DETECTED NOT DETECTED Final   Bordetella pertussis NOT DETECTED NOT DETECTED Final   Bordetella Parapertussis NOT DETECTED NOT DETECTED Final   Chlamydophila pneumoniae NOT DETECTED NOT DETECTED Final   Mycoplasma pneumoniae NOT DETECTED NOT DETECTED Final    Comment: Performed at [facility], [place] [address], [place], [place] [address]  Culture, blood  (routine x 2) Call MD if unable to obtain prior to antibiotics being given     Status: None   Collection Time: 05/20/22 10:38 PM   Specimen: BLOOD RIGHT HAND  Result Value Ref Range Status   Specimen Description BLOOD RIGHT HAND  Final   Special Requests   Final    BOTTLES DRAWN AEROBIC AND ANAEROBIC Blood Culture results may not be optimal due to an inadequate volume of blood received in culture bottles   Culture   Final    NO GROWTH 5 DAYS Performed at [facility], [place] [address], [place], [place] [address]    Report Status 05/25/2022 FINAL  Final  Culture, blood (routine x 2) Call MD if unable to obtain prior to antibiotics being given     Status: None   Collection Time: 05/20/22 10:38 PM   Specimen: BLOOD LEFT HAND  Result Value Ref Range Status   Specimen Description BLOOD LEFT HAND  Final   Special Requests   Final    BOTTLES DRAWN AEROBIC AND ANAEROBIC Blood Culture results may not be optimal due to an inadequate volume of blood received in culture bottles   Culture   Final    NO GROWTH 5 DAYS Performed at [facility], [place] [address], [place], [place] [address]    Report Status 05/25/2022 FINAL  Final    FURTHER DISCHARGE INSTRUCTIONS:  Get Medicines reviewed and adjusted: Please take all your medications with you for your next visit with your Primary MD  Laboratory/radiological data: Please request your Primary MD to go over all hospital tests and procedure/radiological results at the follow up, please ask your Primary MD to get all Hospital records sent to his/her office.  In some cases, they will be blood work, cultures and biopsy results pending at the time of your discharge. Please request that your primary care M.D. goes through all the records of your hospital data and follows up on these results.  Also Note the following: If you experience worsening of your admission symptoms, develop shortness of breath, life threatening emergency, suicidal or  homicidal thoughts you must seek medical attention immediately by calling 911 or  calling your MD immediately  if symptoms less severe.  You must read complete instructions/literature along with all the possible adverse reactions/side effects for all the Medicines you take and that have been prescribed to you. Take any new Medicines after you have completely understood and accpet all the possible adverse reactions/side effects.   Do not drive when taking Pain medications or sleeping medications (Benzodaizepines)  Do not take more than prescribed Pain, Sleep and Anxiety Medications. It is not advisable to combine anxiety,sleep and pain medications without talking with your primary care practitioner  Special Instructions: If you have smoked or chewed Tobacco  in the last 2 yrs please stop smoking, stop any regular Alcohol  and or any Recreational drug use.  Wear Seat belts while driving.  Please note: You were cared for by a hospitalist during your hospital stay. Once you are discharged, your primary care physician will handle any further medical issues. Please note that NO REFILLS for any discharge medications will be authorized once you are discharged, as it is imperative that you return to your primary care physician (or establish a relationship with a primary care physician if you do not have one) for your post hospital discharge needs so that they can reassess your need for medications and monitor your lab values.  Total Time spent coordinating discharge including counseling, education and face to face time equals greater than 30 minutes.  SignedOren Binet 05/28/2022 9:12 AM

## 2022-05-28 NOTE — TOC Transition Note (Signed)
Transition of Care [facility]) - CM/SW Discharge Note   Patient Details  Name: Betty Jackson MRN: [id number] Date of Birth: 04/28/1934  Transition of Care [facility]) CM/SW Contact:  Benard Halsted, LCSW Phone Number: 05/28/2022, 10:53 AM   Clinical Narrative:    Patient will DC to: [facility] Anticipated DC date: 05/28/22 Family notified: Daughter, Human resources officer by: Corey Harold   Per MD patient ready for DC to [facility]. RN to call report prior to discharge [phone] room 104a). RN, patient, patient's family, and facility notified of DC. Discharge Summary and FL2 sent to facility. DC packet on chart including signed DNR and scripts. Ambulance transport requested for patient.   CSW will sign off for now as social work intervention is no longer needed. Please consult [place] again if new needs arise.     Final next level of care: Skilled Nursing Facility Barriers to Discharge: Barriers Resolved   Patient Goals and CMS Choice CMS Medicare.gov Compare Post Acute Care list provided to:: Patient Represenative (must comment) Choice offered to / list presented to :  (Granddaughter)  Discharge Placement     Existing PASRR number confirmed : 05/28/22          Patient chooses bed at: [facility] Patient to be transferred to facility by: [place] Name of family member notified: Daughter, [name] Patient and family notified of of transfer: 05/28/22  Discharge Plan and Services Additional resources added to the After Visit Summary for   In-house Referral: Clinical Social Work   Post Acute Care Choice: [place]                               Social Determinants of Health (SDOH) Interventions SDOH Screenings   Food Insecurity: No Food Insecurity (05/21/2022)  Housing: Low Risk  (05/21/2022)  Transportation Needs: No Transportation Needs (05/21/2022)  Utilities: Not At Risk (05/21/2022)  Alcohol Screen: Low Risk  (07/16/2020)  Depression (PHQ2-9): Low Risk   (07/16/2020)  Financial Resource Strain: Low Risk  (07/16/2020)  Physical Activity: Inactive (07/16/2020)  Social Connections: Unknown (07/16/2020)  Tobacco Use: Low Risk  (05/25/2022)     Readmission Risk Interventions    10/30/2019    9:48 AM 09/26/2019    4:29 PM  Readmission Risk Prevention Plan  Transportation Screening Complete Complete  Medication Review (RN Care Manager) Complete Complete  PCP or Specialist appointment within 3-5 days of discharge Complete Complete  HRI or Home Care Consult Complete Complete  SW Recovery Care/Counseling Consult Complete Complete  Palliative Care Screening Complete Complete  [place] Not Applicable Complete

## 2022-05-28 NOTE — Procedures (Signed)
     [facility] Progress Note   Pathology results from the EGD have returned and show:  FINAL MICROSCOPIC DIAGNOSIS:   A. DUODENUM, BIOPSY:  - Duodenal mucosa with no specific histopathologic changes  - Negative for increased intraepithelial lymphocytes or villous  architectural changes   B. STOMACH, ANTRUM AND BODY, BIOPSY:  - Gastric antral and oxyntic mucosa with nonspecific reactive  gastropathy  - Helicobacter pylori-like organisms are not identified on routine HE  stain   No new recommendations based on these results.   [name], MD, MPH [facility]

## 2022-05-28 NOTE — TOC Progression Note (Signed)
Transition of Care [facility]) - Progression Note    Patient Details  Name: Betty Jackson MRN: [id number] Date of Birth: 1935-01-15  Transition of Care [facility]) CM/SW [place], LCSW Phone Number: 05/28/2022, 9:40 AM  Clinical Narrative:    CSW spoke with patient's daughter, Betty Jackson. She reported agreement with [facility] for transport and is requesting discharge happen as soon as possible to avoid delays. CSW passed on her questions regarding mobility assistance to admissions at [facility] and they confirmed that patient was receiving assistance with transfers prior to hospitalization as well and will adjust her care as needed.    Expected Discharge Plan: Post Barriers to Discharge: Barriers Resolved  Expected Discharge Plan and Services In-house Referral: Clinical Social Work   Post Acute Care Choice: [place] Living arrangements for the past 2 months: [place] Expected Discharge Date: 05/28/22                                     Social Determinants of Health (SDOH) Interventions SDOH Screenings   Food Insecurity: No Food Insecurity (05/21/2022)  Housing: Low Risk  (05/21/2022)  Transportation Needs: No Transportation Needs (05/21/2022)  Utilities: Not At Risk (05/21/2022)  Alcohol Screen: Low Risk  (07/16/2020)  Depression (PHQ2-9): Low Risk  (07/16/2020)  Financial Resource Strain: Low Risk  (07/16/2020)  Physical Activity: Inactive (07/16/2020)  Social Connections: Unknown (07/16/2020)  Tobacco Use: Low Risk  (05/25/2022)    Readmission Risk Interventions    10/30/2019    9:48 AM 09/26/2019    4:29 PM  Readmission Risk Prevention Plan  Transportation Screening Complete Complete  Medication Review (RN Care Manager) Complete Complete  PCP or Specialist appointment within 3-5 days of discharge Complete Complete  HRI or Home Care Consult Complete Complete  SW Recovery Care/Counseling Consult Complete Complete   Palliative Care Screening Complete Complete  [place] Not Applicable Complete

## 2022-05-28 NOTE — Progress Notes (Signed)
Report called to [facility].

## 2022-05-29 ENCOUNTER — Encounter: Payer: Self-pay | Admitting: Cardiovascular Disease

## 2022-07-10 ENCOUNTER — Inpatient Hospital Stay (HOSPITAL_COMMUNITY)
Admission: EM | Admit: 2022-07-10 | Discharge: 2022-07-15 | DRG: 871 | Disposition: A | Discharge status: Hospice | Payer: 59 | Attending: Family Medicine | Admitting: Family Medicine

## 2022-07-10 ENCOUNTER — Other Ambulatory Visit: Payer: Self-pay

## 2022-07-10 ENCOUNTER — Emergency Department (HOSPITAL_COMMUNITY): Payer: 59

## 2022-07-10 ENCOUNTER — Observation Stay (HOSPITAL_COMMUNITY): Payer: 59

## 2022-07-10 DIAGNOSIS — R17 Unspecified jaundice: Secondary | ICD-10-CM | POA: Diagnosis present

## 2022-07-10 DIAGNOSIS — R4182 Altered mental status, unspecified: Secondary | ICD-10-CM | POA: Diagnosis present

## 2022-07-10 DIAGNOSIS — F411 Generalized anxiety disorder: Secondary | ICD-10-CM | POA: Diagnosis present

## 2022-07-10 DIAGNOSIS — K219 Gastro-esophageal reflux disease without esophagitis: Secondary | ICD-10-CM | POA: Diagnosis present

## 2022-07-10 DIAGNOSIS — Z7901 Long term (current) use of anticoagulants: Secondary | ICD-10-CM

## 2022-07-10 DIAGNOSIS — I480 Paroxysmal atrial fibrillation: Secondary | ICD-10-CM | POA: Diagnosis present

## 2022-07-10 DIAGNOSIS — Z1152 Encounter for screening for COVID-19: Secondary | ICD-10-CM

## 2022-07-10 DIAGNOSIS — Z515 Encounter for palliative care: Secondary | ICD-10-CM

## 2022-07-10 DIAGNOSIS — G894 Chronic pain syndrome: Secondary | ICD-10-CM | POA: Diagnosis present

## 2022-07-10 DIAGNOSIS — R0902 Hypoxemia: Secondary | ICD-10-CM | POA: Diagnosis present

## 2022-07-10 DIAGNOSIS — I2510 Atherosclerotic heart disease of native coronary artery without angina pectoris: Secondary | ICD-10-CM | POA: Diagnosis present

## 2022-07-10 DIAGNOSIS — Z79899 Other long term (current) drug therapy: Secondary | ICD-10-CM

## 2022-07-10 DIAGNOSIS — N183 Chronic kidney disease, stage 3 unspecified: Secondary | ICD-10-CM | POA: Diagnosis present

## 2022-07-10 DIAGNOSIS — F329 Major depressive disorder, single episode, unspecified: Secondary | ICD-10-CM | POA: Diagnosis present

## 2022-07-10 DIAGNOSIS — I5032 Chronic diastolic (congestive) heart failure: Secondary | ICD-10-CM | POA: Diagnosis present

## 2022-07-10 DIAGNOSIS — J383 Other diseases of vocal cords: Secondary | ICD-10-CM | POA: Diagnosis present

## 2022-07-10 DIAGNOSIS — Z9049 Acquired absence of other specified parts of digestive tract: Secondary | ICD-10-CM

## 2022-07-10 DIAGNOSIS — R001 Bradycardia, unspecified: Secondary | ICD-10-CM | POA: Diagnosis not present

## 2022-07-10 DIAGNOSIS — Z808 Family history of malignant neoplasm of other organs or systems: Secondary | ICD-10-CM

## 2022-07-10 DIAGNOSIS — M069 Rheumatoid arthritis, unspecified: Secondary | ICD-10-CM | POA: Diagnosis present

## 2022-07-10 DIAGNOSIS — Z5982 Transportation insecurity: Secondary | ICD-10-CM

## 2022-07-10 DIAGNOSIS — R112 Nausea with vomiting, unspecified: Secondary | ICD-10-CM | POA: Diagnosis not present

## 2022-07-10 DIAGNOSIS — M109 Gout, unspecified: Secondary | ICD-10-CM | POA: Diagnosis present

## 2022-07-10 DIAGNOSIS — Z8673 Personal history of transient ischemic attack (TIA), and cerebral infarction without residual deficits: Secondary | ICD-10-CM

## 2022-07-10 DIAGNOSIS — J189 Pneumonia, unspecified organism: Secondary | ICD-10-CM | POA: Diagnosis present

## 2022-07-10 DIAGNOSIS — Z803 Family history of malignant neoplasm of breast: Secondary | ICD-10-CM

## 2022-07-10 DIAGNOSIS — R54 Age-related physical debility: Secondary | ICD-10-CM | POA: Diagnosis present

## 2022-07-10 DIAGNOSIS — I130 Hypertensive heart and chronic kidney disease with heart failure and stage 1 through stage 4 chronic kidney disease, or unspecified chronic kidney disease: Secondary | ICD-10-CM | POA: Diagnosis present

## 2022-07-10 DIAGNOSIS — I491 Atrial premature depolarization: Secondary | ICD-10-CM | POA: Diagnosis not present

## 2022-07-10 DIAGNOSIS — D649 Anemia, unspecified: Secondary | ICD-10-CM | POA: Diagnosis present

## 2022-07-10 DIAGNOSIS — R0682 Tachypnea, not elsewhere classified: Secondary | ICD-10-CM | POA: Diagnosis present

## 2022-07-10 DIAGNOSIS — Z8249 Family history of ischemic heart disease and other diseases of the circulatory system: Secondary | ICD-10-CM

## 2022-07-10 DIAGNOSIS — D509 Iron deficiency anemia, unspecified: Secondary | ICD-10-CM | POA: Diagnosis present

## 2022-07-10 DIAGNOSIS — M316 Other giant cell arteritis: Secondary | ICD-10-CM | POA: Diagnosis present

## 2022-07-10 DIAGNOSIS — Z66 Do not resuscitate: Secondary | ICD-10-CM | POA: Diagnosis present

## 2022-07-10 DIAGNOSIS — E785 Hyperlipidemia, unspecified: Secondary | ICD-10-CM | POA: Diagnosis present

## 2022-07-10 DIAGNOSIS — G9341 Metabolic encephalopathy: Secondary | ICD-10-CM | POA: Diagnosis present

## 2022-07-10 DIAGNOSIS — A419 Sepsis, unspecified organism: Principal | ICD-10-CM | POA: Diagnosis present

## 2022-07-10 DIAGNOSIS — M810 Age-related osteoporosis without current pathological fracture: Secondary | ICD-10-CM | POA: Diagnosis present

## 2022-07-10 DIAGNOSIS — D631 Anemia in chronic kidney disease: Secondary | ICD-10-CM | POA: Diagnosis present

## 2022-07-10 LAB — COMPREHENSIVE METABOLIC PANEL
ALT: 16 U/L (ref 0–44)
AST: 19 U/L (ref 15–41)
Albumin: 2.8 g/dL — ABNORMAL LOW (ref 3.5–5.0)
Alkaline Phosphatase: 75 U/L (ref 38–126)
Anion gap: 9 (ref 5–15)
BUN: 12 mg/dL (ref 8–23)
CO2: 25 mmol/L (ref 22–32)
Calcium: 8.5 mg/dL — ABNORMAL LOW (ref 8.9–10.3)
Chloride: 107 mmol/L (ref 98–111)
Creatinine, Ser: 0.9 mg/dL (ref 0.44–1.00)
GFR, Estimated: >60 mL/min (ref >60)
Glucose, Bld: 103 mg/dL — ABNORMAL HIGH (ref 70–99)
Potassium: 3.6 mmol/L (ref 3.5–5.1)
Sodium: 141 mmol/L (ref 135–145)
Total Bilirubin: 1.7 mg/dL — ABNORMAL HIGH (ref 0.3–1.2)
Total Protein: 5.6 g/dL — ABNORMAL LOW (ref 6.5–8.1)

## 2022-07-10 LAB — URINALYSIS, W/ REFLEX TO CULTURE (INFECTION SUSPECTED)
Bacteria, UA: NONE SEEN
Bilirubin Urine: NEGATIVE
Glucose, UA: NEGATIVE mg/dL
Ketones, ur: NEGATIVE mg/dL
Nitrite: NEGATIVE
Protein, ur: NEGATIVE mg/dL
Specific Gravity, Urine: 1.01 (ref 1.005–1.030)
pH: 5 (ref 5.0–8.0)

## 2022-07-10 LAB — CBC WITH DIFFERENTIAL/PLATELET
Abs Immature Granulocytes: 0.06 10*3/uL (ref 0.00–0.07)
Basophils Absolute: 0 10*3/uL (ref 0.0–0.1)
Basophils Relative: 0 %
Eosinophils Absolute: 0 10*3/uL (ref 0.0–0.5)
Eosinophils Relative: 0 %
HCT: 34.6 % — ABNORMAL LOW (ref 36.0–46.0)
Hemoglobin: 10.7 g/dL — ABNORMAL LOW (ref 12.0–15.0)
Immature Granulocytes: 1 %
Lymphocytes Relative: 14 %
Lymphs Abs: 1.5 10*3/uL (ref 0.7–4.0)
MCH: 30.7 pg (ref 26.0–34.0)
MCHC: 30.9 g/dL (ref 30.0–36.0)
MCV: 99.4 fL (ref 80.0–100.0)
Monocytes Absolute: 1.4 10*3/uL — ABNORMAL HIGH (ref 0.1–1.0)
Monocytes Relative: 14 %
Neutro Abs: 7.6 10*3/uL (ref 1.7–7.7)
Neutrophils Relative %: 71 %
Platelets: 147 10*3/uL — ABNORMAL LOW (ref 150–400)
RBC: 3.48 MIL/uL — ABNORMAL LOW (ref 3.87–5.11)
RDW: 14.9 % (ref 11.5–15.5)
WBC: 10.6 10*3/uL — ABNORMAL HIGH (ref 4.0–10.5)
nRBC: 0 % (ref 0.0–0.2)

## 2022-07-10 LAB — AMMONIA: Ammonia: 24 umol/L (ref 9–35)

## 2022-07-10 LAB — RESP PANEL BY RT-PCR (RSV, FLU A&B, COVID)  RVPGX2
Influenza A by PCR: NEGATIVE
Influenza B by PCR: NEGATIVE
Resp Syncytial Virus by PCR: NEGATIVE
SARS Coronavirus 2 by RT PCR: NEGATIVE

## 2022-07-10 LAB — PROTIME-INR
INR: 1.5 — ABNORMAL HIGH (ref 0.8–1.2)
Prothrombin Time: 18.3 seconds — ABNORMAL HIGH (ref 11.4–15.2)

## 2022-07-10 LAB — LACTIC ACID, PLASMA: Lactic Acid, Venous: 1.5 mmol/L (ref 0.5–1.9)

## 2022-07-10 LAB — APTT: aPTT: 35 seconds (ref 24–36)

## 2022-07-10 LAB — ETHANOL: Alcohol, Ethyl (B): <10 mg/dL (ref <10)

## 2022-07-10 MED ORDER — LACTATED RINGERS IV BOLUS
250.0000 mL | Freq: Once | INTRAVENOUS | Status: AC
  Administered 2022-07-10: 250 mL via INTRAVENOUS

## 2022-07-10 MED ORDER — SODIUM CHLORIDE 0.9 % IV SOLN
2.0000 g | Freq: Once | INTRAVENOUS | Status: AC
  Administered 2022-07-10: 2 g via INTRAVENOUS
  Filled 2022-07-10: qty 12.5

## 2022-07-10 MED ORDER — VANCOMYCIN HCL 1500 MG/300ML IV SOLN
1500.0000 mg | Freq: Once | INTRAVENOUS | Status: AC
  Administered 2022-07-10: 1500 mg via INTRAVENOUS
  Filled 2022-07-10: qty 300

## 2022-07-10 MED ORDER — ENOXAPARIN SODIUM 30 MG/0.3ML IJ SOSY: 30.0000 mg | PREFILLED_SYRINGE | INTRAMUSCULAR | Status: DC

## 2022-07-10 MED ORDER — LACTATED RINGERS IV SOLN: INTRAVENOUS | Status: DC

## 2022-07-10 MED ORDER — ACETAMINOPHEN 325 MG PO TABS: 650.0000 mg | ORAL_TABLET | Freq: Once | ORAL | Status: DC

## 2022-07-10 MED ORDER — ACETAMINOPHEN 650 MG RE SUPP
650.0000 mg | Freq: Once | RECTAL | Status: AC
  Administered 2022-07-10: 650 mg via RECTAL
  Filled 2022-07-10: qty 1

## 2022-07-10 MED ORDER — VANCOMYCIN HCL 750 MG/150ML IV SOLN
750.0000 mg | INTRAVENOUS | Status: DC
  Administered 2022-07-11: 750 mg via INTRAVENOUS
  Filled 2022-07-10: qty 150

## 2022-07-10 MED ORDER — POLYETHYLENE GLYCOL 3350 17 G PO PACK
17.0000 g | PACK | Freq: Every day | ORAL | Status: DC | PRN
  Filled 2022-07-10: qty 1

## 2022-07-10 NOTE — Sepsis Progress Note (Signed)
eLink is following this Code Sepsis. °

## 2022-07-10 NOTE — ED Notes (Addendum)
ED TO INPATIENT HANDOFF REPORT  ED Nurse Name and Phone #: Fleet Contras [phone]  S Name/Age/Gender Betty Jackson 88 y.o. female Room/Bed: 010C/010C  Code Status   Code Status: DNR  Home/SNF/Other Skilled nursing facility Patient oriented to: self Is this baseline? Yes   Triage Complete: Triage complete  Chief Complaint Altered mental status, unspecified [R41.82]  Triage Note Pt BIB Ems from [facility] for AMS and general weakness. Pt recently hospitalized for PNA/Sepsis. Pt responds to voice and does not follow commands. EMS placed 20 R AC and gave 500 LR.  EMS VS 136/84 26 Cap 100 F 94% [place] @ 3lmpn 92 HR a fib    Allergies Allergies  Allergen Reactions   Alendronate Sodium     Suspected cause of severe esophagitis (per EGD 08/05/21)   Dextromethorphan Rash    Level of Care/Admitting Diagnosis ED Disposition     ED Disposition  Admit   Condition  --   Comment  Hospital Area: [facility] [[id number]]  Level of Care: Progressive [102]  Admit to Progressive based on following criteria: NEUROLOGICAL AND NEUROSURGICAL complex patients with significant risk of instability, who do not meet ICU criteria, yet require close observation or frequent assessment (< / = every 2 - 4 hours) with medical / nursing intervention.  Admit to Progressive based on following criteria: MULTISYSTEM THREATS such as stable sepsis, metabolic/electrolyte imbalance with or without encephalopathy that is responding to early treatment.  May place patient in observation at [facility] or [facility] if equivalent level of care is available:: No  Covid Evaluation: Asymptomatic - no recent exposure (last 10 days) testing not required  Diagnosis: Altered mental status, unspecified [[id number]]  Admitting Physician: Lockie Mola [[id number]]  Attending Physician: Latrelle Dodrill [[id number]]          B Medical/Surgery History Past Medical History:  Diagnosis Date   Acute  respiratory failure with hypoxia (HCC) 05/20/2017   Anemia, unspecified 10/28/2012   Anxiety state, unspecified 10/28/2012   CAD (coronary artery disease)    a. Stent RCA in [facility];  b. Cath approx 2009 - nonobs per pt report.   Cataract    immature on the left eye   Chronic insomnia 02/07/2013   Chronic lower back pain    scoliosis   CKD (chronic kidney disease) stage 3, GFR 30-59 ml/min (HCC)    CVA (cerebral infarction) 10/29/2012   DDD (degenerative disc disease)    Diverticulosis    GERD (gastroesophageal reflux disease) 09/01/2010   Hemorrhoids    Herniated nucleus pulposus, L5-S1, right 11/04/2015   Hyperlipidemia    takes Lipitor daily   Hypertension    takes Amlodipine,Losartan,Metoprolol,and HCTZ daily   Incontinence of urine    Insomnia    takes Restoril nightly   Lumbar stenosis 04/25/2013   Major depressive disorder, recurrent episode, moderate (HCC) 07/16/2013   Osteoporosis    PAF (paroxysmal atrial fibrillation) (HCC) 2011   a. lone epidode in 2011 according to notes.   Rheumatoid arthritis (HCC)    Scoliosis    Sepsis (HCC) 05/2019   Stroke [facility])    Temporal arteritis (HCC) 2011   a. followed @ [facility]; potential flareup 10/28/2012/notes 10/28/2012   Vocal cord dysfunction    "they don't operate properly" (10/28/2012)   Past Surgical History:  Procedure Laterality Date   ABDOMINAL HYSTERECTOMY  ~ 1984   vaginally   BACK SURGERY  7-89yrs ago   X Stop   BIOPSY  05/23/2022  Procedure: BIOPSY;  Surgeon: Tressia Danas, MD;  Location: [facility] ENDOSCOPY;  Service: Gastroenterology;;   BLADDER SUSPENSION  2001   BREAST BIOPSY Right    CATARACT EXTRACTION W/ INTRAOCULAR LENS IMPLANT Right ~ 08/2012   CHEST TUBE INSERTION Left 03/08/2019   Procedure: Chest Tube Insertion;  Surgeon: Kerin Perna, MD;  Location: [facility] OR;  Service: Thoracic;  Laterality: Left;   COLONOSCOPY  01/26/2012   Procedure: COLONOSCOPY;  Surgeon: Meryl Dare, MD,FACG;  Location: [facility]  ENDOSCOPY;  Service: Endoscopy;  Laterality: N/A;  note the EGD is possible   COLONOSCOPY WITH PROPOFOL N/A 09/10/2021   Procedure: COLONOSCOPY WITH PROPOFOL;  Surgeon: Iva Boop, MD;  Location: [facility] ENDOSCOPY;  Service: Gastroenterology;  Laterality: N/A;   CORONARY ANGIOPLASTY WITH STENT PLACEMENT  2006   X 1 stent   EPIDURAL BLOCK INJECTION     ESOPHAGOGASTRODUODENOSCOPY  01/26/2012   Procedure: ESOPHAGOGASTRODUODENOSCOPY (EGD);  Surgeon: Meryl Dare, MD,FACG;  Location: [facility] ENDOSCOPY;  Service: Endoscopy;  Laterality: N/A;   ESOPHAGOGASTRODUODENOSCOPY (EGD) WITH PROPOFOL N/A 08/05/2021   Procedure: ESOPHAGOGASTRODUODENOSCOPY (EGD) WITH PROPOFOL;  Surgeon: Sherrilyn Rist, MD;  Location: [facility] ENDOSCOPY;  Service: Gastroenterology;  Laterality: N/A;   ESOPHAGOGASTRODUODENOSCOPY (EGD) WITH PROPOFOL N/A 09/10/2021   Procedure: ESOPHAGOGASTRODUODENOSCOPY (EGD) WITH PROPOFOL;  Surgeon: Iva Boop, MD;  Location: [facility] ENDOSCOPY;  Service: Gastroenterology;  Laterality: N/A;   ESOPHAGOGASTRODUODENOSCOPY (EGD) WITH PROPOFOL N/A 05/23/2022   Procedure: ESOPHAGOGASTRODUODENOSCOPY (EGD) WITH PROPOFOL;  Surgeon: Tressia Danas, MD;  Location: [facility] ENDOSCOPY;  Service: Gastroenterology;  Laterality: N/A;   FINE NEEDLE ASPIRATION Right 09/26/2019   Procedure: FINE NEEDLE ASPIRATION;  Surgeon: Myrene Galas, MD;  Location: [facility] OR;  Service: Orthopedics;  Laterality: Right;   FOREIGN BODY REMOVAL  08/05/2021   Procedure: FOREIGN BODY REMOVAL;  Surgeon: Sherrilyn Rist, MD;  Location: [facility] ENDOSCOPY;  Service: Gastroenterology;;   HEMIARTHROPLASTY HIP Right 2012   IR EPIDUROGRAPHY  04/20/2019   LAPAROSCOPIC CHOLECYSTECTOMY  2001   LUMBAR FUSION  03/2013   LUMBAR LAMINECTOMY/DECOMPRESSION MICRODISCECTOMY Right 11/04/2015   Procedure: Right Lumbar Five-Sacral One Microdiskectomy;  Surgeon: Barnett Abu, MD;  Location: [facility] NEURO ORS;  Service: Neurosurgery;  Laterality: Right;  Right L5-S1 Microdiskectomy    moles removed that required stiches     one on leg and one on face   ORIF FEMUR FRACTURE Right 09/26/2019   Procedure: OPEN REDUCTION INTERNAL FIXATION (ORIF) DISTAL FEMUR FRACTURE;  Surgeon: Myrene Galas, MD;  Location: [facility] OR;  Service: Orthopedics;  Laterality: Right;   ORIF FEMUR FRACTURE Right 10/17/2019   Procedure: OPEN REDUCTION INTERNAL FIXATION (ORIF) DISTAL FEMUR FRACTURE;  Surgeon: Myrene Galas, MD;  Location: [facility] OR;  Service: Orthopedics;  Laterality: Right;   POLYPECTOMY  09/10/2021   Procedure: POLYPECTOMY;  Surgeon: Iva Boop, MD;  Location: [facility] ENDOSCOPY;  Service: Gastroenterology;;   SUBXYPHOID PERICARDIAL WINDOW N/A 03/08/2019   Procedure: SUBXYPHOID PERICARDIAL WINDOW;  Surgeon: Kerin Perna, MD;  Location: [facility] OR;  Service: Thoracic;  Laterality: N/A;   TEE WITHOUT CARDIOVERSION N/A 03/08/2019   Procedure: TRANSESOPHAGEAL ECHOCARDIOGRAM (TEE);  Surgeon: Donata Clay, Theron Arista, MD;  Location: [facility] OR;  Service: Thoracic;  Laterality: N/A;   TEMPORAL ARTERY BIOPSY / LIGATION Bilateral 2011   TONSILLECTOMY AND ADENOIDECTOMY     at age 69   TUBAL LIGATION  ~ 1982   X-STOP IMPLANTATION  ~ 2010   "lower back" (10/28/2012)     A IV Location/Drains/Wounds Patient Lines/Drains/Airways Status  Active Line/Drains/Airways     Name Placement date Placement time Site Days   Peripheral IV 07/10/22 20 G Anterior;Proximal;Right Forearm 07/10/22  1502  Forearm  less than 1   Urethral Catheter C. [place] Temperature probe 14 Fr. 07/10/22  1730  Temperature probe  less than 1   Pressure Injury 10/17/19 Heel Right Stage 2 -  Partial thickness loss of dermis presenting as a shallow open injury with a red, pink wound bed without slough. 10/17/19  2100  -- 997            Intake/Output Last 24 hours  Intake/Output Summary (Last 24 hours) at 07/10/2022 2259 Last data filed at 07/10/2022 1904 Gross per 24 hour  Intake 300.24 ml  Output --  Net 300.24 ml    Labs/Imaging Results  for orders placed or performed during the hospital encounter of 07/10/22 (from the past 48 hour(s))  Lactic acid, plasma     Status: None   Collection Time: 07/10/22  3:05 PM  Result Value Ref Range   Lactic Acid, Venous 1.5 0.5 - 1.9 mmol/L    Comment: Performed at [facility], [address], [place], [place] [address]  Comprehensive metabolic panel     Status: Abnormal   Collection Time: 07/10/22  3:05 PM  Result Value Ref Range   Sodium 141 135 - 145 mmol/L   Potassium 3.6 3.5 - 5.1 mmol/L   Chloride 107 98 - 111 mmol/L   CO2 25 22 - 32 mmol/L   Glucose, Bld 103 (H) 70 - 99 mg/dL    Comment: Glucose reference range applies only to samples taken after fasting for at least 8 hours.   BUN 12 8 - 23 mg/dL   Creatinine, Ser 6.04 0.44 - 1.00 mg/dL   Calcium 8.5 (L) 8.9 - 10.3 mg/dL   Total Protein 5.6 (L) 6.5 - 8.1 g/dL   Albumin 2.8 (L) 3.5 - 5.0 g/dL   AST 19 15 - 41 U/L   ALT 16 0 - 44 U/L   Alkaline Phosphatase 75 38 - 126 U/L   Total Bilirubin 1.7 (H) 0.3 - 1.2 mg/dL   GFR, Estimated >54 >09 mL/min    Comment: (NOTE) Calculated using the CKD-EPI Creatinine Equation (2021)    Anion gap 9 5 - 15    Comment: Performed at [facility], [address], [place], [place] [address]  CBC with Differential     Status: Abnormal   Collection Time: 07/10/22  3:05 PM  Result Value Ref Range   WBC 10.6 (H) 4.0 - 10.5 K/uL   RBC 3.48 (L) 3.87 - 5.11 MIL/uL   Hemoglobin 10.7 (L) 12.0 - 15.0 g/dL   HCT 47.8 (L) 29.5 - 62.1 %   MCV 99.4 80.0 - 100.0 fL   MCH 30.7 26.0 - 34.0 pg   MCHC 30.9 30.0 - 36.0 g/dL   RDW 30.8 65.7 - 84.6 %   Platelets 147 (L) 150 - 400 K/uL   nRBC 0.0 0.0 - 0.2 %   Neutrophils Relative % 71 %   Neutro Abs 7.6 1.7 - 7.7 K/uL   Lymphocytes Relative 14 %   Lymphs Abs 1.5 0.7 - 4.0 K/uL   Monocytes Relative 14 %   Monocytes Absolute 1.4 (H) 0.1 - 1.0 K/uL   Eosinophils Relative 0 %   Eosinophils Absolute 0.0 0.0 - 0.5 K/uL   Basophils Relative 0 %    Basophils Absolute 0.0 0.0 - 0.1 K/uL  Immature Granulocytes 1 %   Abs Immature Granulocytes 0.06 0.00 - 0.07 K/uL    Comment: Performed at [facility], [address] [place], [place] [address]  Protime-INR     Status: Abnormal   Collection Time: 07/10/22  3:05 PM  Result Value Ref Range   Prothrombin Time 18.3 (H) 11.4 - 15.2 seconds   INR 1.5 (H) 0.8 - 1.2    Comment: (NOTE) INR goal varies based on device and disease states. Performed at [facility], [address] [place], [place] [address]   APTT     Status: None   Collection Time: 07/10/22  3:05 PM  Result Value Ref Range   aPTT 35 24 - 36 seconds    Comment: Performed at [facility] Lab, [address], [place], [place] [address]  Ammonia     Status: None   Collection Time: 07/10/22  3:05 PM  Result Value Ref Range   Ammonia 24 9 - 35 umol/L    Comment: Performed at [facility] Lab, [address] [place], [place] [address]  Ethanol     Status: None   Collection Time: 07/10/22  3:05 PM  Result Value Ref Range   Alcohol, Ethyl (B) <10 <10 mg/dL    Comment: (NOTE) Lowest detectable limit for serum alcohol is 10 mg/dL.  For medical purposes only. Performed at [facility], [address], [place], [place] [address]   Resp panel by RT-PCR (RSV, Flu A&B, Covid) Anterior Nasal Swab     Status: None   Collection Time: 07/10/22  3:06 PM   Specimen: Anterior Nasal Swab  Result Value Ref Range   SARS Coronavirus 2 by RT PCR NEGATIVE NEGATIVE   Influenza A by PCR NEGATIVE NEGATIVE   Influenza B by PCR NEGATIVE NEGATIVE    Comment: (NOTE) The Xpert Xpress SARS-CoV-2/FLU/RSV plus assay is intended as an aid in the diagnosis of influenza from Nasopharyngeal swab specimens and should not be used as a sole basis for treatment. Nasal washings and aspirates are unacceptable for Xpert Xpress SARS-CoV-2/FLU/RSV testing.  Fact Sheet for Patients: BloggerCourse.com  Fact  Sheet for Healthcare Providers: SeriousBroker.it  This test is not yet approved or cleared by the [place] FDA and has been authorized for detection and/or diagnosis of SARS-CoV-2 by FDA under an Emergency Use Authorization (EUA). This EUA will remain in effect (meaning this test can be used) for the duration of the COVID-19 declaration under Section 564(b)(1) of the Act, 21 U.S.C. section 360bbb-3(b)(1), unless the authorization is terminated or revoked.     Resp Syncytial Virus by PCR NEGATIVE NEGATIVE    Comment: (NOTE) Fact Sheet for Patients: BloggerCourse.com  Fact Sheet for Healthcare Providers: SeriousBroker.it  This test is not yet approved or cleared by the [place] FDA and has been authorized for detection and/or diagnosis of SARS-CoV-2 by FDA under an Emergency Use Authorization (EUA). This EUA will remain in effect (meaning this test can be used) for the duration of the COVID-19 declaration under Section 564(b)(1) of the Act, 21 U.S.C. section 360bbb-3(b)(1), unless the authorization is terminated or revoked.  Performed at [facility], [address] [place], [place] [address]   Urinalysis, w/ Reflex to Culture (Infection Suspected) -Urine, Catheterized     Status: Abnormal   Collection Time: 07/10/22  5:39 PM  Result Value Ref Range   Specimen Source URINE, CATHETERIZED    Color, Urine YELLOW YELLOW   APPearance  CLEAR CLEAR   Specific Gravity, Urine 1.010 1.005 - 1.030   pH 5.0 5.0 - 8.0   Glucose, UA NEGATIVE NEGATIVE mg/dL   Hgb urine dipstick SMALL (A) NEGATIVE   Bilirubin Urine NEGATIVE NEGATIVE   Ketones, ur NEGATIVE NEGATIVE mg/dL   Protein, ur NEGATIVE NEGATIVE mg/dL   Nitrite NEGATIVE NEGATIVE   Leukocytes,Ua MODERATE (A) NEGATIVE   RBC / HPF 0-5 0 - 5 RBC/hpf   WBC, UA 11-20 0 - 5 WBC/hpf    Comment:        Reflex urine culture not performed if WBC <=10,  OR if Squamous epithelial cells >5. If Squamous epithelial cells >5 suggest recollection.    Bacteria, UA NONE SEEN NONE SEEN   Squamous Epithelial / HPF 0-5 0 - 5 /HPF   Mucus PRESENT    Non Squamous Epithelial 0-5 (A) NONE SEEN    Comment: Performed at [facility], [address] [place], [place] [address]   *Note: Due to a large number of results and/or encounters for the requested time period, some results have not been displayed. A complete set of results can be found in Results Review.   Abd 1 View (KUB)  Result Date: 07/10/2022 CLINICAL DATA:  Altered mental status EXAM: ABDOMEN - 1 VIEW COMPARISON:  05/22/2022 FINDINGS: Nonobstructive bowel gas pattern. Cholecystectomy clips. Lower thoracic/upper lumbar vertebral augmentation. Lumbar spine fixation hardware. Median sternotomy. IMPRESSION: Negative. Electronically Signed   By: Charline Bills M.D.   On: 07/10/2022 21:12   CT Head Wo Contrast  Result Date: 07/10/2022 CLINICAL DATA:  Mental status change, unknown cause EXAM: CT HEAD WITHOUT CONTRAST TECHNIQUE: Contiguous axial images were obtained from the base of the skull through the vertex without intravenous contrast. RADIATION DOSE REDUCTION: This exam was performed according to the departmental dose-optimization program which includes automated exposure control, adjustment of the mA and/or kV according to patient size and/or use of iterative reconstruction technique. COMPARISON:  Head CT 05/20/2022, brain MRI 05/22/2022 FINDINGS: Brain: No intracranial hemorrhage, mass effect, or midline shift. Stable age related atrophy. No hydrocephalus. The basilar cisterns are patent. Advanced periventricular and deep white matter hypodensity, typical of chronic small vessel ischemia, without significant interval change. No evidence of territorial infarct or acute ischemia. No extra-axial or intracranial fluid collection. Vascular: Atherosclerosis of skullbase vasculature without hyperdense  vessel or abnormal calcification. Skull: No fracture or focal lesion. Sinuses/Orbits: Partial opacification of left mastoid air cells, new from prior. The paranasal sinuses are clear. Bilateral cataract resection Other: None. IMPRESSION: 1. No acute intracranial abnormality. 2. Stable age related atrophy and chronic small vessel ischemia. 3. Partial opacification of left mastoid air cells, new from prior exam. Electronically Signed   By: Narda Rutherford M.D.   On: 07/10/2022 16:02   DG Chest Port 1 View  Result Date: 07/10/2022 CLINICAL DATA:  Possible sepsis EXAM: PORTABLE CHEST 1 VIEW COMPARISON:  Previous studies including the examination of 05/21/2022 FINDINGS: There is poor inspiration. Transverse diameter of heart is increased. There are no signs of alveolar pulmonary edema. Increased interstitial markings are seen in the parahilar regions and lower lung fields. There is interval clearing of patchy linear infiltrates in the lower lung fields. There is minimal blunting of left lateral CP angle. There is no pneumothorax. IMPRESSION: Cardiomegaly. Increased interstitial markings in the parahilar regions and lower lung fields may suggest interstitial pulmonary edema or interstitial pneumonia. Small left pleural effusion. Electronically Signed   By: Ernie Avena M.D.   On:  07/10/2022 15:39    Pending Labs Unresulted Labs (From admission, onward)     Start     Ordered   07/11/22 0500  CBC  Tomorrow morning,   R        07/10/22 2036   07/11/22 0500  Comprehensive metabolic panel  Tomorrow morning,   R        07/10/22 2036   07/10/22 2056  MRSA Next Gen by PCR, Nasal  Once,   R        07/10/22 2055   07/10/22 2040  Rapid urine drug screen (hospital performed)  ONCE - STAT,   STAT        07/10/22 2039   07/10/22 2037  TSH  Add-on,   AD        07/10/22 2036   07/10/22 1739  Urine Culture  Once,   R        07/10/22 1739   07/10/22 1506  Blood Culture (routine x 2)  (Septic presentation on  arrival (screening labs, nursing and treatment orders for obvious sepsis))  BLOOD CULTURE X 2,   STAT      07/10/22 1513            Vitals/Pain Today's Vitals   07/10/22 1800 07/10/22 2100 07/10/22 2219 07/10/22 2230  BP: (!) 116/39 (!) 107/39  (!) 115/49  Pulse: 98 94  (!) 102  Resp: (!) 31 (!) 26  (!) 29  Temp:   99.8 F (37.7 C)   TempSrc:   Axillary   SpO2: 96% 96%  93%  Weight:      Height:        Isolation Precautions No active isolations  Medications Medications  vancomycin (VANCOREADY) IVPB 750 mg/150 mL (has no administration in time range)  polyethylene glycol (MIRALAX / GLYCOLAX) packet 17 g (has no administration in time range)  lactated ringers infusion (has no administration in time range)  lactated ringers bolus 250 mL (has no administration in time range)  ceFEPIme (MAXIPIME) 2 g in sodium chloride 0.9 % 100 mL IVPB (0 g Intravenous Stopped 07/10/22 1632)  vancomycin (VANCOREADY) IVPB 1500 mg/300 mL (0 mg Intravenous Stopped 07/10/22 1904)  acetaminophen (TYLENOL) suppository 650 mg (650 mg Rectal Given 07/10/22 1724)    Mobility non-ambulatory        R Recommendations: See Admitting Provider Note  Report given to:   Additional Notes: Per MD Marsh Dolly, facility placed fentanyl patch on right shoulder today, it will stay on for 3 days.

## 2022-07-10 NOTE — Assessment & Plan Note (Addendum)
Normocytic anemia with hemoglobin 10.7 from baseline of 12-13.  Most likely low in the setting of acute illness.  Could have some hemolysis in the setting of illness given total bilirubin of 1.7.  Could also have some nutritional deficiencies given patient has history of difficulty eating due to vocal cord dysfunction. - AM CBC - Consider B12, folate, iron and TIBC

## 2022-07-10 NOTE — ED Notes (Signed)
Patient sleepy, holding off on swallow screen at this time due to patient's alertness.

## 2022-07-10 NOTE — ED Triage Notes (Signed)
Pt BIB Ems from [facility] for AMS and general weakness. Pt recently hospitalized for PNA/Sepsis. Pt responds to voice and does not follow commands. EMS placed 20 R AC and gave 500 LR.  EMS VS 136/84 26 Cap 100 F 94% [place] @ 3lmpn 92 HR a fib

## 2022-07-10 NOTE — ED Notes (Signed)
Per MD Marsh Dolly, facility placed fentanyl patch on RIGHT shoulder today, and to label it for today.  Fentanyl patch labeled.

## 2022-07-10 NOTE — ED Notes (Signed)
Pt to CT

## 2022-07-10 NOTE — ED Notes (Signed)
Phlebotomy recruited for blood cultures.

## 2022-07-10 NOTE — Assessment & Plan Note (Signed)
Patient has isolated hyperbilirubinemia. AST, ALT, alk phos all within normal limits. KUB with nonobstructive gas pattern no obvious abdominal abnormalities.  No jaundice on exam.   - A.m. CMP, if still abnormal could get fractionated hemoglobin, haptoglobin, smear, right upper quadrant ultrasound

## 2022-07-10 NOTE — Sepsis Progress Note (Signed)
Unsuccessful attempt at blood cultures was made prior to giving the antibiotic per secure chat with bedside RN Osvaldo Shipper

## 2022-07-10 NOTE — Progress Notes (Signed)
Pharmacy Antibiotic Note  OLITA COMEAUX is a 88 y.o. female admitted on 07/10/2022 presenting from facility with AMS, concern for sepsis.  Pharmacy has been consulted for vancomycin dosing.  Gram negative coverage per MD  Plan: Vancomycin 1500 mg IV x 1, then 750 mg IV q 24h (eAUC 466) Monitor renal function, Cx and clinical progression to narrow Vancomycin levels as indicated  Height: 5\' 4"  (162.6 cm) Weight: 84.9 kg (187 lb 2.7 oz) IBW/kg (Calculated) : 54.7  Temp (24hrs), Avg:100 F (37.8 C), Min:100 F (37.8 C), Max:100 F (37.8 C)  Recent Labs  Lab 07/10/22 1505  WBC 10.6*  CREATININE 0.90  LATICACIDVEN 1.5    Estimated Creatinine Clearance: 47.3 mL/min (by C-G formula based on SCr of 0.9 mg/dL).    Allergies  Allergen Reactions   Alendronate Sodium     Suspected cause of severe esophagitis (per EGD 08/05/21)   Dextromethorphan Rash    Daylene Posey, PharmD, [facility] Clinical Pharmacist ED Pharmacist Phone # [phone] 07/10/2022 4:36 PM

## 2022-07-10 NOTE — Assessment & Plan Note (Addendum)
Slow improvement in mentation with antibiotic therapy for her pneumonia. Metabolic encephalopathy seems the most likely explanation for her presentation at this time. Able to converse and eat this morning. - MRSA nasal swab neg, d/c vanc - Continue cefepime, likely transition to PO tomorrow if Bcx remain negative and mental status allows - Continue to follow blood cultures - Had held narcotics, restart slowly with 2.5mg  oxycodone, titrate up to her home norco dose as mental status allows - Delirium precautions  - Fall precautions  - Second hospitalization in two months, if does not improve rapidly, may be worthwhile involving palliative care, it appears she does follow with them outpatient -- Restarting home prednisone to avoid potential adrenal insufficiency contribution

## 2022-07-10 NOTE — ED Provider Notes (Signed)
[facility] AT [facility] Provider Note   CSN: [id number] Arrival date & time: 07/10/22  1457     History  Chief Complaint  Patient presents with   Fever   Code Sepsis   Level 5 caveat: Mental status change  Betty Jackson is a 88 y.o. female with a PMHx of HTN, Afib, CAD, GERD, who presents to the ED with concerns for AMS onset today.  Per EMS, patient brought in from [facility] for concerns for altered mental status and generalized weakness.  Patient was recently hospitalized for pneumonia and sepsis on 05/20/2022.  The history is provided by the patient. No language interpreter was used.       Home Medications Prior to Admission medications   Medication Sig Start Date End Date Taking? Authorizing Provider  acetaminophen (TYLENOL) 500 MG tablet Take 500 mg by mouth every 6 (six) hours as needed for mild pain.    [provider]  allopurinol (ZYLOPRIM) 100 MG tablet Take 100 mg by mouth daily.    [provider]  amiodarone (PACERONE) 200 MG tablet Take 1 tablet (200 mg total) by mouth daily. 05/28/22   Ghimire, Werner Lean, MD  atorvastatin (LIPITOR) 80 MG tablet Take 1 tablet (80 mg total) by mouth daily. Patient taking differently: Take 80 mg by mouth at bedtime. 09/25/20   Micki Riley, MD  cetirizine (ZYRTEC) 10 MG tablet Take 10 mg by mouth at bedtime.    [provider]  cholecalciferol (VITAMIN D) 25 MCG tablet Take 2 tablets (2,000 Units total) by mouth 2 (two) times daily. 09/29/19   Montez Morita, PA-C  diltiazem (CARDIZEM CD) 240 MG 24 hr capsule Take 240 mg by mouth every evening. 10/10/21   [provider]  ELIQUIS 5 MG TABS tablet TAKE 1 TABLET BY MOUTH TWICE A DAY Patient taking differently: Take 5 mg by mouth 2 (two) times daily. 05/09/20   Kathleene Hazel, MD  escitalopram (LEXAPRO) 10 MG tablet TAKE 1 TABLET BY MOUTH EVERY DAY Patient taking differently: Take 10 mg by mouth daily. 08/27/20    Burchette, Elberta Fortis, MD  fentaNYL (DURAGESIC) 25 MCG/HR Place 1 patch onto the skin every 3 (three) days. 05/29/22   Ghimire, Werner Lean, MD  ferrous sulfate 325 (65 FE) MG tablet Take 1 tablet (325 mg total) by mouth 2 (two) times daily with a meal. 04/21/19   Meredeth Ide, MD  furosemide (LASIX) 40 MG tablet Take 1 tablet (40 mg total) by mouth daily as needed (extra dose, for weight gain of 2 pounds overnight or 5 pound in 1 week). 09/11/21   Vassie Loll, MD  HYDROcodone-acetaminophen (NORCO) 5-325 MG tablet Take 1 tablet by mouth every 12 (twelve) hours as needed for severe pain. 05/28/22   Ghimire, Werner Lean, MD  HYDROcodone-acetaminophen (NORCO) 5-325 MG tablet Take 1 tablet by mouth at bedtime. 05/28/22   Ghimire, Werner Lean, MD  ipratropium-albuterol (DUONEB) 0.5-2.5 (3) MG/3ML SOLN Take 3 mLs by nebulization every 4 (four) hours as needed. Patient taking differently: Take 3 mLs by nebulization every 4 (four) hours as needed (wheezing/shortness of breath). 08/10/21   Osvaldo Shipper, MD  metoprolol tartrate (LOPRESSOR) 100 MG tablet Take 100 mg by mouth 2 (two) times daily.    [provider]  Multiple Vitamin (MULTIVITAMIN WITH MINERALS) TABS tablet Take 1 tablet by mouth daily. 10/21/19   Montez Morita, PA-C  nitroGLYCERIN (NITROSTAT) 0.4 MG SL tablet Place 1 tablet (0.4 mg  total) under the tongue every 5 (five) minutes as needed for chest pain. 02/25/22   Kathleene Hazel, MD  Nutritional Supplement LIQD Take 120 mLs by mouth daily. House stock supplement    [provider]  ondansetron (ZOFRAN) 4 MG tablet Take 4 mg by mouth every 8 (eight) hours as needed for nausea or vomiting.    [provider]  OXYGEN Inhale 2 L/min into the lungs as needed (maintain SATS above 90%).    [provider]  pantoprazole (PROTONIX) 40 MG tablet Take 1 tablet (40 mg total) by mouth 2 (two) times daily. 08/11/21   Osvaldo Shipper, MD  polyethylene glycol (MIRALAX) 17 g  packet Take 17 g by mouth daily. 05/28/22   Ghimire, Werner Lean, MD  potassium chloride (KLOR-CON M) 10 MEQ tablet Take 3 tablets (30 mEq total) by mouth daily. 09/11/21   Vassie Loll, MD  Pramox-PE-Glycerin-Petrolatum (HEMORRHOIDAL MAX STR) 1-0.25-14.4-15 % CREA Apply 1 Application topically 2 (two) times daily as needed (hemorrhoidal pain/discomfort.). 09/11/21   Vassie Loll, MD  predniSONE (DELTASONE) 10 MG tablet Take 1 tablet (10 mg total) by mouth daily with breakfast. Resume after 3 days of 20mg  daily as instructed. 08/15/21   Osvaldo Shipper, MD  QUEtiapine (SEROQUEL) 25 MG tablet Take 1 tablet (25 mg total) by mouth at bedtime. 03/16/21 05/20/22  Hughie Closs, MD  senna-docusate (SENOKOT-S) 8.6-50 MG tablet Take 2 tablets by mouth at bedtime. 05/28/22   Ghimire, Werner Lean, MD  sulfaSALAzine (AZULFIDINE) 500 MG EC tablet Take 500 mg by mouth daily.    [provider]      Allergies    Alendronate sodium and Dextromethorphan    Review of Systems   Review of Systems  All other systems reviewed and are negative.   Physical Exam Updated Vital Signs BP (!) 116/39   Pulse 98   Temp 100 F (37.8 C) (Oral)   Resp (!) 31   Ht 5\' 4"  (1.626 m)   Wt 84.9 kg   SpO2 96%   BMI 32.13 kg/m  Physical Exam Vitals and nursing note reviewed.  Constitutional:      General: She is not in acute distress.    Appearance: She is not diaphoretic.     Comments: On 3 L oxygen via [place]  HENT:     Head: Normocephalic and atraumatic.     Mouth/Throat:     Pharynx: No oropharyngeal exudate.  Eyes:     General: No scleral icterus.    Conjunctiva/sclera: Conjunctivae normal.  Cardiovascular:     Rate and Rhythm: Normal rate and regular rhythm.     Pulses: Normal pulses.     Heart sounds: Normal heart sounds.  Pulmonary:     Effort: Pulmonary effort is normal. No respiratory distress.     Breath sounds: Normal breath sounds. No wheezing.     Comments: Increased work of breathing.  Tachypneic.  Abdominal:     General: Bowel sounds are normal.     Palpations: Abdomen is soft. There is no mass.     Tenderness: There is no abdominal tenderness. There is no guarding or rebound.  Musculoskeletal:        General: Normal range of motion.     Cervical back: Normal range of motion and neck supple.  Skin:    General: Skin is warm and dry.  Neurological:     Mental Status: She is alert.  Psychiatric:        Behavior: Behavior normal.  ED Results / Procedures / Treatments   Labs (all labs ordered are listed, but only abnormal results are displayed) Labs Reviewed  COMPREHENSIVE METABOLIC PANEL - Abnormal; Notable for the following components:      Result Value   Glucose, Bld 103 (*)    Calcium 8.5 (*)    Total Protein 5.6 (*)    Albumin 2.8 (*)    Total Bilirubin 1.7 (*)    All other components within normal limits  CBC WITH DIFFERENTIAL/PLATELET - Abnormal; Notable for the following components:   WBC 10.6 (*)    RBC 3.48 (*)    Hemoglobin 10.7 (*)    HCT 34.6 (*)    Platelets 147 (*)    Monocytes Absolute 1.4 (*)    All other components within normal limits  PROTIME-INR - Abnormal; Notable for the following components:   Prothrombin Time 18.3 (*)    INR 1.5 (*)    All other components within normal limits  RESP PANEL BY RT-PCR (RSV, FLU A&B, COVID)  RVPGX2  CULTURE, BLOOD (ROUTINE X 2)  CULTURE, BLOOD (ROUTINE X 2)  LACTIC ACID, PLASMA  APTT  AMMONIA  ETHANOL  LACTIC ACID, PLASMA  URINALYSIS, W/ REFLEX TO CULTURE (INFECTION SUSPECTED)    EKG None  Radiology CT Head Wo Contrast  Result Date: 07/10/2022 CLINICAL DATA:  Mental status change, unknown cause EXAM: CT HEAD WITHOUT CONTRAST TECHNIQUE: Contiguous axial images were obtained from the base of the skull through the vertex without intravenous contrast. RADIATION DOSE REDUCTION: This exam was performed according to the departmental dose-optimization program which includes automated exposure  control, adjustment of the mA and/or kV according to patient size and/or use of iterative reconstruction technique. COMPARISON:  Head CT 05/20/2022, brain MRI 05/22/2022 FINDINGS: Brain: No intracranial hemorrhage, mass effect, or midline shift. Stable age related atrophy. No hydrocephalus. The basilar cisterns are patent. Advanced periventricular and deep white matter hypodensity, typical of chronic small vessel ischemia, without significant interval change. No evidence of territorial infarct or acute ischemia. No extra-axial or intracranial fluid collection. Vascular: Atherosclerosis of skullbase vasculature without hyperdense vessel or abnormal calcification. Skull: No fracture or focal lesion. Sinuses/Orbits: Partial opacification of left mastoid air cells, new from prior. The paranasal sinuses are clear. Bilateral cataract resection Other: None. IMPRESSION: 1. No acute intracranial abnormality. 2. Stable age related atrophy and chronic small vessel ischemia. 3. Partial opacification of left mastoid air cells, new from prior exam. Electronically Signed   By: Narda Rutherford M.D.   On: 07/10/2022 16:02   DG Chest Port 1 View  Result Date: 07/10/2022 CLINICAL DATA:  Possible sepsis EXAM: PORTABLE CHEST 1 VIEW COMPARISON:  Previous studies including the examination of 05/21/2022 FINDINGS: There is poor inspiration. Transverse diameter of heart is increased. There are no signs of alveolar pulmonary edema. Increased interstitial markings are seen in the parahilar regions and lower lung fields. There is interval clearing of patchy linear infiltrates in the lower lung fields. There is minimal blunting of left lateral CP angle. There is no pneumothorax. IMPRESSION: Cardiomegaly. Increased interstitial markings in the parahilar regions and lower lung fields may suggest interstitial pulmonary edema or interstitial pneumonia. Small left pleural effusion. Electronically Signed   By: Ernie Avena M.D.   On:  07/10/2022 15:39    Procedures Procedures    Medications Ordered in ED Medications  vancomycin (VANCOREADY) IVPB 1500 mg/300 mL (1,500 mg Intravenous New Bag/Given 07/10/22 1704)  vancomycin (VANCOREADY) IVPB 750 mg/150 mL (has no administration in time  range)  ceFEPIme (MAXIPIME) 2 g in sodium chloride 0.9 % 100 mL IVPB (0 g Intravenous Stopped 07/10/22 1632)  acetaminophen (TYLENOL) suppository 650 mg (650 mg Rectal Given 07/10/22 1724)    ED Course/ Medical Decision Making/ A&P Clinical Course as of 07/10/22 1858  Fri Jul 10, 2022  1705 Discussion with family lab and imaging findings and recommendation for admission. Answered all available questions.  [SB]    Clinical Course User Index [SB] Racquel Arkin A, PA-C                             Medical Decision Making Amount and/or Complexity of Data Reviewed Labs: ordered. Radiology: ordered. ECG/medicine tests: ordered.  Risk OTC drugs. Prescription drug management. Decision regarding hospitalization.   Pt presents with concerns for altered mental status onset today.  Patient was recently admitted for pneumonia on 05/20/2022. Vital signs, patient with elevated temperature at 100 (axillary), tachycardia and tachypneic. On exam, pt with no acute cardiovascular, respiratory, abdominal exam findings. Differential diagnosis includes anemia, hypoglycemia, intracranial abnormality, electrolyte abnormality, PNA, acute cystitis, arrhythmia, CVA, TIA.    Co morbidities that complicate the patient evaluation: Hypertension, CHF,  Additional history obtained:  Additional history obtained from Daughter/Son and EMS External records from outside source obtained and reviewed including: Patient was admitted on 05/20/2022 for hypoxia and pneumonia of the bilateral lower lobes  Labs:  I ordered, and personally interpreted labs.  The pertinent results include:  Coags obtained Negative ethanol Negative lactic Negative pneumonia Negative  COVID, flu, RSV CBC with slightly elevated WBC at 10.6 otherwise unremarkable CMP with slightly elevated total bilirubin at 1.7 otherwise LFTs unremarkable   Imaging: I ordered imaging studies including CT head, CXR I independently visualized and interpreted imaging which showed: Chest x-ray with  Cardiomegaly. Increased interstitial markings in the parahilar  regions and lower lung fields may suggest interstitial pulmonary  edema or interstitial pneumonia. Small left pleural effusion.   CT head with  1. No acute intracranial abnormality.  2. Stable age related atrophy and chronic small vessel ischemia.  3. Partial opacification of left mastoid air cells, new from prior  exam.   I agree with the radiologist interpretation  Medications:  I ordered medication including Tylenol, vancomycin, cefepime for antibiotic prophylaxis I have reviewed the patients home medicines and have made adjustments as needed  Patient case discussed with Dr. Lockie Mola, at sign-out. Plan at sign-out is pending UA, likely Admission to the hospital, however, plans may change as per oncoming team. Patient care transferred at sign out.    This chart was dictated using voice recognition software, Dragon. Despite the best efforts of this provider to proofread and correct errors, errors may still occur which can change documentation meaning.   Final Clinical Impression(s) / ED Diagnoses Final diagnoses:  Altered mental status, unspecified altered mental status type    Rx / DC Orders ED Discharge Orders     None         Hikaru Delorenzo A, PA-C 07/10/22 1858    Virgina Norfolk, DO 07/10/22 1924

## 2022-07-10 NOTE — H&P (Addendum)
 Hospital Admission History and Physical Service Pager: [phone]  Patient name: Betty Jackson Medical record number: [id number] Date of Birth: April 05, 1934 Age: 88 y.o. Gender: female  Primary Care Provider: Laurine Gladden, MD Consultants: None Code Status: DNR which was confirmed with family, yellow MOST form at bedside  Preferred Emergency Contact:   Contact Information     Name Relation Home Work Onset Granddaughter   [phone]   Vogelsinger,Melissa-joint HCPOA Daughter [phone]  [phone]   Benincasa,Melanie-joint HCPOA Daughter   [phone]       Chief Complaint: AMS  Assessment and Plan: Betty Jackson is a 88 y.o. female presenting with altered mental status. Differential for this patient's presentation of this includes acute toxic metabolic encephalopathy due to opioid use, trazodone  side effect, PNA causing confusion, UTI, CVA, underlying dementia/cognitive changes, thyroid  storm.  Her altered mental status is most likely multifactorial in the setting of recent change in nighttime medication with the addition of trazodone  (more likely), new pneumonia (more likely), opioid use for chronic pain (although less likely etiology given longstanding use due to chronic pain at generally acceptable doses).  She was recently admitted during February for altered mental status and had acute toxic metabolic encephalopathy due to opioid use for chronic pain along with pneumonia/sepsis at that time as well.  She has a history of multiple admissions (>5) over past 3 years for sepsis with initial presentation of encephalopathy. Etiology typically pneumonia, urinary infection, or otherwise unknown. One admission for diverticulitis- so can consider CT abdomen if no improvement over next 12-24 hours.  She had already started to improve by time of admission by FMTS, per daughter.  * Altered mental status, unspecified Patient is usually alert and oriented at  baseline.   S/p vancomycin  and cefepime  in the ED.  Anticipate improvement with antibiotics, IVF hydration given similar presentation with infectious etiology at last admission. - Admit to FPTS, Attending Dr. Donah - Narrow antibiotics as appropriate - Follow-up MRSA swab - Follow-up blood cultures, and urine cultures - TSH - Monitor respiratory status - Start 75 cc/h IVF - Every 4 hours neurochecks - Maintain current fentanyl  patch (Placed today 4/12; end date 4/14) hold prn Norco - UDS - Holding home PO medications due to decreased alertness - Delirium precautions  - Fall precautions  - Pt/Ot eval and treat   Hyperbilirubinemia Patient has isolated hyperbilirubinemia. AST, ALT, alk phos all within normal limits.  She did miss to palpation of right upper quadrant.  KUB with nonobstructive gas pattern no obvious abdominal abnormalities.  No jaundice on exam.  Does have mild anemia to 10.7 from baseline of about 12.  Could be due to hemolysis in the setting of infection. - A.m. CMP if still abnormal could get fractionated hemoglobin, haptoglobin, smear, right upper quadrant ultrasound  Anemia Normocytic anemia with hemoglobin 10.7 from baseline of 12-13.  Most likely low in the setting of acute illness.  Could have some hemolysis in the setting of illness given total bilirubin of 1.7.  Could also have some nutritional deficiencies given patient has history of difficulty eating due to vocal cord dysfunction. - AM CBC - Consider B12, folate, iron and TIBC   Chronic Conditions  PAF - Resume Eliquis , amiodarone , diltiazem  when mental status improves  HFpEF - Resume metoprolol  and lasix  when mental status improves  HLD - Resume atorvastatin  when mental status improves  MDD - Hold trazadone, Resume lexapro  and seroquel  when mental status improves  Gout - Resume  allopurinol  when mental status improves  Giant Cell arteritis - Resume prednisone  when mental status improves   FEN/GI: NPO  while altered VTE Prophylaxis: On therapeutic eliquis    Disposition: Pending clinical improvement and evaluation by PT and OT   History of Present Illness:  Betty Jackson is a 88 y.o. female presenting with altered mental status  Daughter reports that she was worried about her mom when, she did not call to ask about the new baby born in the family yesterday which she said was strange for her. She said she had a splitting headache and felt terrible.  CNA walked in and she had vomited on herself around 10:30 AM. This is what happened prior to her last admission. She then called the family and let them know, so they came to the ER. She does have vocal cord dysfunction and coughs a lot which has been going on for years.  Sometimes she gets choked and coughs. Softer foods for her diet.  Daughter also mentions that night before last patient's Seroquel  of 25 mg was decreased to 12.5 and trazodone  25 mg was added.  She was not on trazodone  previously.  CNA from facility denies any other residents being sick at the facility recently.  Patient took her a.m. medications this morning.  Patient was discharged from last admission she was given oxygen to use as needed.  She used it once or twice when discharged but has not used it since then.  In the ED, was afebrile but tachypneic, tachycardic but normotensive.  She was hypoxic requiring 3 L on low-flow nasal cannula.  A code sepsis was called and patient was started on vancomycin  and cefepime .  Due to altered mental status CT head was done which did not show any acute intracranial abnormalities.  Chest x-ray was significant for cardiomegaly with increased interstitial markings in the perihilar regions and lower lung fields suggesting either interstitial pulmonary edema or interstitial pneumonia.  Also had a small left pleural effusion.  KUB negative for obstruction.  Review Of Systems: Per HPI with the following additions: vomiting, altered mental  status  Pertinent Past Medical History: PAF on eliquis   HFpEF 60-65% H/o CVA Depression  HLD HTN CAD CKD III Chronic Pain Syndrome Vocal cord dysfunction  Remainder reviewed in history tab.   Pertinent Past Surgical History: Multiple back surgeries for DDD Remainder reviewed in history tab.   Pertinent Social History: Tobacco use: Yes/No/Former Alcohol  use: None Other Substance use: None Lives at [facility]   Pertinent Family History: Remainder reviewed in history tab.   Important Outpatient Medications: Eliquis   Allopurinol   Amiodarone   Atorvastatin   Diltiazem  Lexapro   Norco  Metoprolol   Protonix   Seroquel   Trazodone    Remainder reviewed in medication history.   Objective: BP (!) 128/55 (BP Location: Right Arm)   Pulse 97   Temp 97.8 F (36.6 C) (Oral)   Resp (!) 27   Ht 5' 4 (1.626 m)   Wt 78.8 kg   SpO2 96%   BMI 29.82 kg/m  Exam: General: Somnolent appearing, chronically ill appearing  Eyes: 2 mm pupils, PERRLA ENTM: mucous membranes dry with left sided small blister Cardiovascular: RRR, no M/R/G, cap refill < 2 seconds, trace pitting edema Bl  Respiratory: No increased work of breathing on 2L, CTAB of upper airfields, difficult to auscultate lower lung fields due to positioning  Gastrointestinal: Soft, non distended, mildly tender to palpation on right side- some guarding Derm: No visible wounds, single fentanyl  patch on posterior right  shoulder  Neuro: somnolent, incomprehensible grunts/moans, occasionally will grunt in affirmation or negative, squeezes hand to command R and :  Labs:  CBC BMET  Recent Labs  Lab 07/10/22 1505  WBC 10.6*  HGB 10.7*  HCT 34.6*  PLT 147*   Recent Labs  Lab 07/10/22 1505  NA 141  K 3.6  CL 107  CO2 25  BUN 12  CREATININE 0.90  GLUCOSE 103*  CALCIUM  8.5*    Pertinent additional labs Total bilirubin 1.7.   Imaging Studies Performed: CT Head wo Contrast: . No acute intracranial  abnormality. 2. Stable age related atrophy and chronic small vessel ischemia. 3. Partial opacification of left mastoid air cells, new from prior Exam  DG Chest: Cardiomegaly. Increased interstitial markings in the parahilar regions and lower lung fields may suggest interstitial pulmonary edema or interstitial pneumonia. Small left pleural effusion.  Areta Saliva, MD 07/11/2022, 2:00 AM PGY-1, [facility]  FPTS Intern pager: [phone], text pages welcome Secure chat group [facility] Teaching Service    Upper Level Addendum:  I have seen and evaluated this patient along with Dr. Saliva  and reviewed the above note, making necessary revisions.  Taite Schoeppner, DO 07/11/2022, 2:00 AM PGY-2, [facility]

## 2022-07-11 DIAGNOSIS — Z7189 Other specified counseling: Secondary | ICD-10-CM | POA: Diagnosis not present

## 2022-07-11 DIAGNOSIS — Z66 Do not resuscitate: Secondary | ICD-10-CM | POA: Diagnosis present

## 2022-07-11 DIAGNOSIS — R627 Adult failure to thrive: Secondary | ICD-10-CM | POA: Diagnosis not present

## 2022-07-11 DIAGNOSIS — D631 Anemia in chronic kidney disease: Secondary | ICD-10-CM | POA: Diagnosis present

## 2022-07-11 DIAGNOSIS — M316 Other giant cell arteritis: Secondary | ICD-10-CM | POA: Diagnosis present

## 2022-07-11 DIAGNOSIS — R4182 Altered mental status, unspecified: Secondary | ICD-10-CM | POA: Diagnosis present

## 2022-07-11 DIAGNOSIS — F329 Major depressive disorder, single episode, unspecified: Secondary | ICD-10-CM | POA: Diagnosis present

## 2022-07-11 DIAGNOSIS — Z79899 Other long term (current) drug therapy: Secondary | ICD-10-CM | POA: Diagnosis not present

## 2022-07-11 DIAGNOSIS — I480 Paroxysmal atrial fibrillation: Secondary | ICD-10-CM | POA: Diagnosis present

## 2022-07-11 DIAGNOSIS — Z515 Encounter for palliative care: Secondary | ICD-10-CM | POA: Diagnosis not present

## 2022-07-11 DIAGNOSIS — Z1152 Encounter for screening for COVID-19: Secondary | ICD-10-CM | POA: Diagnosis not present

## 2022-07-11 DIAGNOSIS — N183 Chronic kidney disease, stage 3 unspecified: Secondary | ICD-10-CM | POA: Diagnosis present

## 2022-07-11 DIAGNOSIS — R17 Unspecified jaundice: Secondary | ICD-10-CM | POA: Diagnosis present

## 2022-07-11 DIAGNOSIS — E785 Hyperlipidemia, unspecified: Secondary | ICD-10-CM | POA: Diagnosis present

## 2022-07-11 DIAGNOSIS — M069 Rheumatoid arthritis, unspecified: Secondary | ICD-10-CM | POA: Diagnosis present

## 2022-07-11 DIAGNOSIS — Z7901 Long term (current) use of anticoagulants: Secondary | ICD-10-CM | POA: Diagnosis not present

## 2022-07-11 DIAGNOSIS — A419 Sepsis, unspecified organism: Secondary | ICD-10-CM | POA: Diagnosis present

## 2022-07-11 DIAGNOSIS — I5032 Chronic diastolic (congestive) heart failure: Secondary | ICD-10-CM | POA: Diagnosis present

## 2022-07-11 DIAGNOSIS — J189 Pneumonia, unspecified organism: Secondary | ICD-10-CM | POA: Diagnosis present

## 2022-07-11 DIAGNOSIS — J383 Other diseases of vocal cords: Secondary | ICD-10-CM | POA: Diagnosis present

## 2022-07-11 DIAGNOSIS — R54 Age-related physical debility: Secondary | ICD-10-CM | POA: Diagnosis present

## 2022-07-11 DIAGNOSIS — I2510 Atherosclerotic heart disease of native coronary artery without angina pectoris: Secondary | ICD-10-CM | POA: Diagnosis present

## 2022-07-11 DIAGNOSIS — Z8673 Personal history of transient ischemic attack (TIA), and cerebral infarction without residual deficits: Secondary | ICD-10-CM | POA: Diagnosis not present

## 2022-07-11 DIAGNOSIS — G9341 Metabolic encephalopathy: Secondary | ICD-10-CM | POA: Diagnosis present

## 2022-07-11 DIAGNOSIS — I130 Hypertensive heart and chronic kidney disease with heart failure and stage 1 through stage 4 chronic kidney disease, or unspecified chronic kidney disease: Secondary | ICD-10-CM | POA: Diagnosis present

## 2022-07-11 DIAGNOSIS — G894 Chronic pain syndrome: Secondary | ICD-10-CM | POA: Diagnosis present

## 2022-07-11 DIAGNOSIS — M109 Gout, unspecified: Secondary | ICD-10-CM | POA: Diagnosis present

## 2022-07-11 LAB — MRSA NEXT GEN BY PCR, NASAL: MRSA by PCR Next Gen: NOT DETECTED

## 2022-07-11 LAB — COMPREHENSIVE METABOLIC PANEL
ALT: 15 U/L (ref 0–44)
AST: 17 U/L (ref 15–41)
Albumin: 2.4 g/dL — ABNORMAL LOW (ref 3.5–5.0)
Alkaline Phosphatase: 72 U/L (ref 38–126)
Anion gap: 6 (ref 5–15)
BUN: 12 mg/dL (ref 8–23)
CO2: 22 mmol/L (ref 22–32)
Calcium: 8 mg/dL — ABNORMAL LOW (ref 8.9–10.3)
Chloride: 109 mmol/L (ref 98–111)
Creatinine, Ser: 0.96 mg/dL (ref 0.44–1.00)
GFR, Estimated: 58 mL/min — ABNORMAL LOW (ref >60)
Glucose, Bld: 94 mg/dL (ref 70–99)
Potassium: 3.7 mmol/L (ref 3.5–5.1)
Sodium: 137 mmol/L (ref 135–145)
Total Bilirubin: 2.1 mg/dL — ABNORMAL HIGH (ref 0.3–1.2)
Total Protein: 5 g/dL — ABNORMAL LOW (ref 6.5–8.1)

## 2022-07-11 LAB — RAPID URINE DRUG SCREEN, HOSP PERFORMED
Amphetamines: NOT DETECTED
Barbiturates: NOT DETECTED
Benzodiazepines: NOT DETECTED
Cocaine: NOT DETECTED
Opiates: POSITIVE — AB
Tetrahydrocannabinol: NOT DETECTED

## 2022-07-11 LAB — CBC
HCT: 33 % — ABNORMAL LOW (ref 36.0–46.0)
HCT: 35.8 % — ABNORMAL LOW (ref 36.0–46.0)
Hemoglobin: 10.3 g/dL — ABNORMAL LOW (ref 12.0–15.0)
Hemoglobin: 11.1 g/dL — ABNORMAL LOW (ref 12.0–15.0)
MCH: 31.1 pg (ref 26.0–34.0)
MCH: 31.1 pg (ref 26.0–34.0)
MCHC: 31.2 g/dL (ref 30.0–36.0)
MCHC: 31 g/dL (ref 30.0–36.0)
MCV: 99.7 fL (ref 80.0–100.0)
MCV: 100.3 fL — ABNORMAL HIGH (ref 80.0–100.0)
Platelets: 135 10*3/uL — ABNORMAL LOW (ref 150–400)
Platelets: 152 10*3/uL (ref 150–400)
RBC: 3.31 MIL/uL — ABNORMAL LOW (ref 3.87–5.11)
RBC: 3.57 MIL/uL — ABNORMAL LOW (ref 3.87–5.11)
RDW: 14.8 % (ref 11.5–15.5)
RDW: 14.8 % (ref 11.5–15.5)
WBC: 12.2 10*3/uL — ABNORMAL HIGH (ref 4.0–10.5)
WBC: 11 10*3/uL — ABNORMAL HIGH (ref 4.0–10.5)
nRBC: 0 % (ref 0.0–0.2)
nRBC: 0 % (ref 0.0–0.2)

## 2022-07-11 LAB — BILIRUBIN, FRACTIONATED(TOT/DIR/INDIR)
Bilirubin, Direct: 0.9 mg/dL — ABNORMAL HIGH (ref 0.0–0.2)
Indirect Bilirubin: 1.2 mg/dL — ABNORMAL HIGH (ref 0.3–0.9)
Total Bilirubin: 2.1 mg/dL — ABNORMAL HIGH (ref 0.3–1.2)

## 2022-07-11 LAB — DIFFERENTIAL
Abs Immature Granulocytes: 0.07 10*3/uL (ref 0.00–0.07)
Basophils Absolute: 0 10*3/uL (ref 0.0–0.1)
Basophils Relative: 0 %
Eosinophils Absolute: 0.3 10*3/uL (ref 0.0–0.5)
Eosinophils Relative: 3 %
Immature Granulocytes: 1 %
Lymphocytes Relative: 12 %
Lymphs Abs: 1.3 10*3/uL (ref 0.7–4.0)
Monocytes Absolute: 1.6 10*3/uL — ABNORMAL HIGH (ref 0.1–1.0)
Monocytes Relative: 15 %
Neutro Abs: 7.7 10*3/uL (ref 1.7–7.7)
Neutrophils Relative %: 69 %

## 2022-07-11 LAB — TECHNOLOGIST SMEAR REVIEW
Clinical Information: ELEVATED
Plt Morphology: NORMAL

## 2022-07-11 LAB — BLOOD GAS, VENOUS
Acid-base deficit: 1.1 mmol/L (ref 0.0–2.0)
Bicarbonate: 25.3 mmol/L (ref 20.0–28.0)
O2 Saturation: 90.2 %
Patient temperature: 37.8
pCO2, Ven: 50 mmHg (ref 44–60)
pH, Ven: 7.32 (ref 7.25–7.43)
pO2, Ven: 60 mmHg — ABNORMAL HIGH (ref 32–45)

## 2022-07-11 LAB — GLUCOSE, CAPILLARY: Glucose-Capillary: 97 mg/dL (ref 70–99)

## 2022-07-11 LAB — TSH: TSH: 2.534 u[IU]/mL (ref 0.350–4.500)

## 2022-07-11 MED ORDER — ACETAMINOPHEN 325 MG PO TABS
650.0000 mg | ORAL_TABLET | Freq: Four times a day (QID) | ORAL | Status: DC | PRN
  Filled 2022-07-11: qty 2

## 2022-07-11 MED ORDER — ONDANSETRON HCL 4 MG PO TABS: 4.0000 mg | ORAL_TABLET | Freq: Once | ORAL | Status: DC

## 2022-07-11 MED ORDER — SODIUM CHLORIDE 0.9 % IV SOLN
2.0000 g | Freq: Two times a day (BID) | INTRAVENOUS | Status: DC
  Administered 2022-07-11 – 2022-07-13 (×4): 2 g via INTRAVENOUS
  Filled 2022-07-11 (×4): qty 12.5

## 2022-07-11 MED ORDER — ORAL CARE MOUTH RINSE: 15.0000 mL | OROMUCOSAL | Status: DC | PRN

## 2022-07-11 MED ORDER — PROCHLORPERAZINE EDISYLATE 10 MG/2ML IJ SOLN: 10.0000 mg | Freq: Once | INTRAMUSCULAR | Status: DC

## 2022-07-11 MED ORDER — SODIUM CHLORIDE 0.9 % IV SOLN: INTRAVENOUS | Status: DC | PRN

## 2022-07-11 MED ORDER — ACETAMINOPHEN 650 MG RE SUPP: 650.0000 mg | Freq: Four times a day (QID) | RECTAL | Status: DC | PRN

## 2022-07-11 MED ORDER — ACETAMINOPHEN 650 MG RE SUPP
650.0000 mg | Freq: Four times a day (QID) | RECTAL | Status: DC | PRN
  Administered 2022-07-12 (×2): 650 mg via RECTAL
  Filled 2022-07-11 (×2): qty 1

## 2022-07-11 MED ORDER — ONDANSETRON HCL 4 MG/2ML IJ SOLN
4.0000 mg | Freq: Three times a day (TID) | INTRAMUSCULAR | Status: DC | PRN
  Administered 2022-07-11 – 2022-07-12 (×3): 4 mg via INTRAVENOUS
  Filled 2022-07-11 (×3): qty 2

## 2022-07-11 MED ORDER — CHLORHEXIDINE GLUCONATE CLOTH 2 % EX PADS
6.0000 | MEDICATED_PAD | Freq: Every day | CUTANEOUS | Status: DC
  Administered 2022-07-11 – 2022-07-14 (×4): 6 via TOPICAL

## 2022-07-11 MED ORDER — ACETAMINOPHEN 10 MG/ML IV SOLN
1000.0000 mg | Freq: Four times a day (QID) | INTRAVENOUS | Status: DC | PRN
  Filled 2022-07-11: qty 100

## 2022-07-11 NOTE — Evaluation (Signed)
Physical Therapy Evaluation Patient Details Name: Betty Jackson MRN: [id number] DOB: [birth year] Today's Date: 07/11/2022  History of Present Illness  Pt is 88 yo female presenting via EMS from [facility] for AMS and general weakness.  PMH - CVA, ckd, chronic pain, temporal arteritis, first degree AV block, HFpEF, RA, HTN, Afib, CAD, rt hip hemiarthroplasty, lumbar fusion.  Clinical Impression  Pt is presenting slightly below baseline. Currently she is Total assist for functional mobility requiring two person assist for bed mobility and stand pivot transfer from EOB to recliner. Due to pt current functional status, home set up and available assistance at home recommending pt return to [facility] for a higher level of care and frequency of skilled physical therapy services at 5x/week in order to work on functional mobility, strength, balance to decrease risk for immobility, skin break down, falls, injury and re-hospitalization. Pt remained slightly lethargic throughout session but was intermittently able to answer questions correctly.       Recommendations for follow up therapy are one component of a multi-disciplinary discharge planning process, led by the attending physician.  Recommendations may be updated based on patient status, additional functional criteria and insurance authorization.  Follow Up Recommendations Can patient physically be transported by private vehicle: No     Assistance Recommended at Discharge Frequent or constant Supervision/Assistance  Patient can return home with the following  Two people to help with walking and/or transfers;Assistance with cooking/housework;Assist for transportation;Help with stairs or ramp for entrance    Equipment Recommendations Other (comment) (defer to post acute)  Recommendations for Other Services       Functional Status Assessment Patient has had a recent decline in their functional status and demonstrates the ability to make  significant improvements in function in a reasonable and predictable amount of time.     Precautions / Restrictions Precautions Precautions: Fall Restrictions Weight Bearing Restrictions: No      Mobility  Bed Mobility Overal bed mobility: Needs Assistance Bed Mobility: Rolling, Sidelying to Sit Rolling: Max assist, +2 for physical assistance Sidelying to sit: Total assist, +2 for physical assistance       General bed mobility comments: VC for hand placement, sequencing, and use of bed rails during rolling and transitioning from sidelying to sitting EOB Patient Response: Flat affect, Cooperative  Transfers Overall transfer level: Needs assistance Equipment used: 2 person hand held assist Transfers: Sit to/from Stand, Bed to chair/wheelchair/BSC Sit to Stand: Max assist, +2 physical assistance, From elevated surface Stand pivot transfers: +2 physical assistance, Max assist         General transfer comment: 2 person face to face transfer technique.    Ambulation/Gait       General Gait Details: unable to progress today due to current functional status       Balance Overall balance assessment: Needs assistance Sitting-balance support: Bilateral upper extremity supported, Feet supported Sitting balance-[name]: Poor Sitting balance - Comments: intermittent min assist to maintain sitting balance. Postural control: Right lateral lean Standing balance support: Bilateral upper extremity supported, During functional activity Standing balance-[name]: Zero Standing balance comment: reliant on therapist for balance         Pertinent Vitals/Pain Pain Assessment Pain Assessment: Faces Faces Pain Scale: Hurts a little bit Pain Location: generalized during bed mobility Pain Descriptors / Indicators: Grimacing Pain Intervention(s): Monitored during session, Repositioned    Home Living Family/patient expects to be discharged to:: Skilled nursing facility       Prior Function Prior Level  of Function : Needs assist   Mobility Comments: non ambulatory. uses wheelchair. Stand pvt transfers at baseline. ADLs Comments: staff assists with ADLs/IADLs     Hand Dominance   Dominant Hand: Right    Extremity/Trunk Assessment   Upper Extremity Assessment Upper Extremity Assessment: Defer to OT evaluation    Lower Extremity Assessment Lower Extremity Assessment: Generalized weakness    Cervical / Trunk Assessment Cervical / Trunk Assessment: Normal  Communication   Communication: HOH (has hearing aids in room)  Cognition Arousal/Alertness: Awake/alert (sleepy at end of session) Behavior During Therapy: Flat affect Overall Cognitive Status: No family/caregiver present to determine baseline cognitive functioning     General Comments: oriented to place, birthday. did not know why she was in the hospital. Able to follow one step commands with increased time and hand over hand assist provided as needed during bed mobility.        General Comments General comments (skin integrity, edema, etc.): HR elevated to 128 during activity and bed mobility. Once in chair HR decreased to 113. BP in recliner: 116/58        Assessment/Plan    PT Assessment Patient needs continued PT services  PT Problem List Decreased strength;Decreased mobility;Decreased activity tolerance;Decreased balance;Pain       PT Treatment Interventions DME instruction;Therapeutic activities;Gait training;Therapeutic exercise;Patient/family education;Stair training;Balance training;Functional mobility training    PT Goals (Current goals can be found in the Care Plan section)  Acute Rehab PT Goals Patient Stated Goal: Return to SNF to work on functional mobility PT Goal Formulation: Patient unable to participate in goal setting Time For Goal Achievement: 07/25/22 Potential to Achieve Goals: Fair    Frequency Min 2X/week     Co-evaluation PT/OT/SLP  Co-Evaluation/Treatment: Yes Reason for Co-Treatment: To address functional/ADL transfers PT goals addressed during session: Mobility/safety with mobility;Balance OT goals addressed during session: ADL's and self-care;Proper use of Adaptive equipment and DME       AM-PAC PT "6 Clicks" Mobility  Outcome Measure Help needed turning from your back to your side while in a flat bed without using bedrails?: Total Help needed moving from lying on your back to sitting on the side of a flat bed without using bedrails?: Total Help needed moving to and from a bed to a chair (including a wheelchair)?: Total Help needed standing up from a chair using your arms (e.g., wheelchair or bedside chair)?: Total Help needed to walk in hospital room?: Total Help needed climbing 3-5 steps with a railing? : Total 6 Click Score: 6    End of Session Equipment Utilized During Treatment: Gait belt Activity Tolerance: Patient tolerated treatment well;Patient limited by fatigue Patient left: in chair;with chair alarm set;with call bell/phone within reach Nurse Communication: Mobility status PT Visit Diagnosis: Other abnormalities of gait and mobility (R26.89);Muscle weakness (generalized) (M62.81)    Time: 1610-9604 PT Time Calculation (min) (ACUTE ONLY): 33 min   Charges:   PT Evaluation $PT Eval Low Complexity: 1 Low          Harrel Carina, DPT, CLT  Acute Rehabilitation Services Office: [phone] (Secure chat preferred)   Claudia Desanctis 07/11/2022, 11:27 AM

## 2022-07-11 NOTE — Progress Notes (Signed)
Daily Progress Note Intern Pager: [phone]  Patient name: Betty Jackson Medical record number: [id number] Date of birth: 06-12-1934 Age: 88 y.o. Gender: female  Primary Care Provider: Keane Police, MD Consultants: None Code Status: UDS  Pt Overview and Major Events to Date:  4/12 - Admitted, Started on vancomycin and cefepime   Assessment and Plan: Betty Jackson is a 88 y.o. female with PMH significant for PAF on eliquis, Chronic pain with opioid use, MDD, HFpEF, giant cell arteritis who presented with altered mental status concerning for acute metabolic encephalopathy in the setting of medication change, pneumonia, and opioid use.   * Altered mental status, unspecified Patient is usually alert and oriented at baseline.   S/p vancomycin and cefepime in the ED.  Anticipate improvement with antibiotics, IVF hydration given similar presentation with infectious etiology at last admission. - Admit to FPTS, Attending Dr. Pollie Meyer - Narrow antibiotics as appropriate - Follow-up MRSA swab - Follow-up blood cultures, and urine cultures - TSH - Monitor respiratory status - Start 75 cc/h IVF - Every 4 hours neurochecks - Maintain current fentanyl patch (Placed today 4/12; end date 4/14) hold prn Norco - UDS - Holding home PO medications due to decreased alertness - Delirium precautions  - Fall precautions  - Pt/Ot eval and treat   Hyperbilirubinemia Patient has isolated hyperbilirubinemia. AST, ALT, alk phos all within normal limits.  She did miss to palpation of right upper quadrant.  KUB with nonobstructive gas pattern no obvious abdominal abnormalities.  No jaundice on exam.  Does have mild anemia to 10.7 from baseline of about 12.  Could be due to hemolysis in the setting of infection. - A.m. CMP if still abnormal could get fractionated hemoglobin, haptoglobin, smear, right upper quadrant ultrasound  Anemia Normocytic anemia with hemoglobin 10.7 from baseline of 12-13.   Most likely low in the setting of acute illness.  Could have some hemolysis in the setting of illness given total bilirubin of 1.7.  Could also have some nutritional deficiencies given patient has history of difficulty eating due to vocal cord dysfunction. - AM CBC - Consider B12, folate, iron and TIBC   Chronic Conditions  PAF - Resume Eliquis, amiodarone, diltiazem when mental status improves  HFpEF - Resume metoprolol and lasix when mental status improves  HLD - Resume atorvastatin when mental status improves  MDD - Hold trazadone, Resume lexapro and seroquel when mental status improves  Gout - Resume allopurinol when mental status improves  Giant Cell arteritis - Resume prednisone when mental status improves   FEN/GI: NPO  PPx: SCDs, will resume therapeutic eliquis when mental status improves Dispo:Pending clinical improvement and evaluation by PT and OT   Subjective:  Patient says that she is nauseous this morning.   Objective: Temp:  [97.8 F (36.6 C)-100 F (37.8 C)] 97.8 F (36.6 C) (04/13 0001) Pulse Rate:  [91-107] 97 (04/13 0001) Resp:  [25-34] 27 (04/13 0001) BP: (107-155)/(39-56) 128/55 (04/13 0001) SpO2:  [93 %-98 %] 96 % (04/13 0001) FiO2 (%):  [100 %] 100 % (04/12 1512) Weight:  [78.8 kg-84.9 kg] 78.8 kg (04/13 0001) Physical Exam: General: Chronically ill appearing, in no acute distress  Cardiovascular: RRR, cap refill < 2 seconds, BLE edema trace edema  Respiratory: No increased work of breathing, on 5L, CTAB, diminished at the bases  Abdomen: Soft, non tender, non distended  Neuro: Lethargic, grunts in response to questions and noxious stimuli, not oriented, pupils still 2 mm and reactive, moves  all extremities equally spontaneously   Laboratory: Most recent CBC Lab Results  Component Value Date   WBC 10.6 (H) 07/10/2022   HGB 10.7 (L) 07/10/2022   HCT 34.6 (L) 07/10/2022   MCV 99.4 07/10/2022   PLT 147 (L) 07/10/2022   Most recent BMP    Latest  Ref Rng & Units 07/10/2022    3:05 PM  BMP  Glucose 70 - 99 mg/dL 916   BUN 8 - 23 mg/dL 12   Creatinine 3.84 - 1.00 mg/dL 6.65   Sodium 993 - 570 mmol/L 141   Potassium 3.5 - 5.1 mmol/L 3.6   Chloride 98 - 111 mmol/L 107   CO2 22 - 32 mmol/L 25   Calcium 8.9 - 10.3 mg/dL 8.5     KUB Nonobstructive bowel gas pattern.   Cholecystectomy clips. Lower thoracic/upper lumbar vertebral augmentation. Lumbar spine fixation hardware. Median sternotomy.  Lockie Mola, MD 07/11/2022, 2:07 AM  PGY-1, [facility] Family Medicine FPTS Intern pager: [phone], text pages welcome Secure chat group [facility] Teaching Service

## 2022-07-11 NOTE — Progress Notes (Signed)
Fentanyl patch removed from patient right back shoulder. Fentanyl patch wasted with Eduardo Osier RN in sharps container.

## 2022-07-11 NOTE — Progress Notes (Signed)
   07/11/22 0217  Assess: MEWS Score  Temp 100 F (37.8 C)  BP 121/64  MAP (mmHg) 75  Pulse Rate (!) 101  ECG Heart Rate (!) 105  Resp 20  Level of Consciousness Responds to Pain  SpO2 (!) 83 %  Assess: MEWS Score  MEWS Temp 0  MEWS Systolic 0  MEWS Pulse 1  MEWS RR 0  MEWS LOC 2  MEWS Score 3  MEWS Score Color Yellow  Assess: if the MEWS score is Yellow or Red  Were vital signs taken at a resting state? Yes  Focused Assessment Change from prior assessment (see assessment flowsheet)  Does the patient meet 2 or more of the SIRS criteria? Yes  Does the patient have a confirmed or suspected source of infection? No  MEWS guidelines implemented  Yes, yellow  Treat  MEWS Interventions Considered administering scheduled or prn medications/treatments as ordered  Take Vital Signs  Increase Vital Sign Frequency  Yellow: Q2hr x1, continue Q4hrs until patient remains green for 12hrs  Escalate  MEWS: Escalate Yellow: Discuss with charge nurse and consider notifying provider and/or RRT  Notify: Charge Nurse/RN  Name of Charge Nurse/RN Notified [place] F RN  Provider Notification  Provider Name/Title Dameron MD  Date Provider Notified 07/11/22  Time Provider Notified [phone]  Method of Notification Call  Notification Reason Change in status  Provider response En route  Date of Provider Response 07/11/22  Time of Provider Response 0239  Notify: Rapid Response  Name of Rapid Response RN Notified Mindy RN  Date Rapid Response Notified 07/11/22  Time Rapid Response Notified 0235  Assess: SIRS CRITERIA  SIRS Temperature  0  SIRS Pulse 1  SIRS Respirations  0  SIRS WBC 1  SIRS Score Sum  2

## 2022-07-11 NOTE — Progress Notes (Signed)
Received page from RN with concern for desaturations to 70% intermittently.  She is also concerned about patient's mentation and not being as arousable as she was when she arrived to the unit.  RN touched base with rapid response who recommended ABG, but they did not come to bedside to evaluate patient. RN tells me that on patient's arrival to the floor, she was able to say her name.  I went to bedside with Dr. Marsh Dolly to evaluate patient.  Objective She is laying in bed comfortably, snoring quite loudly with Jasper in bilateral nares. She easily awakens to voice when I say her name, but then falls right back asleep. Respirations are regular, 18 respirations per minute.  SpO2 98% on 4L [place], although breathing through mouth only. Lungs are generally clear, although I only listened anteriorly and slightly laterally.  No coarse sounds or crackles appreciable.  Assessment Her current clinical status is largely unchanged from when I saw her in the ED, other than she is sleeping comfortably versus when she was in the ED she had her eyes open. She may have some underlying component of OSA, in addition to known possible pneumonia. Also might just have difficulty with alertness upon waking up out of sleep as seen in the elderly population.  Plan No need for repeat chest x-ray at this time. Discontinue IV fluids to ensure no component of fluid overload, she is generally well-hydrated on exam. Facemask for oxygen supplementation instead of [place] due to mouth breathing. O2 goal >90% Will monitor closely and reassess in the next hour or so.  Darral Dash, DO PGY-2 Family Medicine

## 2022-07-11 NOTE — Progress Notes (Addendum)
Patient sleeping/lethargic, spoke to Dr Janeal Holmes, asked him to come assess patient, MD also aware that foley has not been removed due to lethargy at this time.

## 2022-07-11 NOTE — Progress Notes (Signed)
FMTS Interim Progress Note  S: Patient seen at bedside with daughter Efraim Kaufmann present.  RN reports that her mental status was slightly improved earlier in the day, was oriented to self and location and was able to follow some commands.  Currently more somnolent.  Earlier, RN reported nausea and gagging-she was given IV ondansetron.  O: BP (!) 148/54 (BP Location: Right Arm)   Pulse 100   Temp 98.6 F (37 C) (Oral)   Resp 20   Ht 5\' 4"  (1.626 m)   Wt 78.8 kg   SpO2 97%   BMI 29.82 kg/m   General: Somnolent, briefly awakens to voice but will fall back asleep  A/P: Sepsis likely secondary to pneumonia with hypoactive delirium Mental status without significant improvement currently quite somnolent.  Blood cultures no growth so far less than 12 hours.  MRSA nasal PCR negative.  Continue broad-spectrum antibiotics for now.  Avoid CNS acting agents.  Littie Deeds, MD 07/11/2022, 10:22 PM PGY-3, [facility] Family Medicine Service pager [phone]

## 2022-07-11 NOTE — Care Management Obs Status (Signed)
MEDICARE OBSERVATION STATUS NOTIFICATION   Patient Details  Name: Betty Jackson MRN: [id number] Date of Birth: Mar 29, 1935   Medicare Observation Status Notification Given:  Yes    Isaias Cowman, RN 07/11/2022, 3:59 PM

## 2022-07-11 NOTE — Progress Notes (Signed)
I spoke with pt's daughter Betty Jackson on the phone to give her an update. We are currently treating with broad spectrum antibiotics including Vanco and cefepime while we were waiting on urine and blood cultures.  Patient last fevered to 100.8 this morning.   Pt is more alert than she was this morning. She is able to correctly state her name, birthday, and location. When asked what year it is she says [birth year]. When not being asked a question she intermittently rambles but it is very difficult to understand what she is trying to say at times. She was able to communicate that her back was hurting. Tylenol has been ordered.   Daughter Betty Jackson says her mother has a history of becoming delirious in the hospital at night time and she is concerned about her mother being able to sleep. She used to take Seroquel 25 mg nightly along with her norco which seemed to work well. I assured her daughter I will pass on this information to the night team.  Betty Jackson would like [place] to continue to keep her updated.

## 2022-07-11 NOTE — Evaluation (Signed)
Occupational Therapy Evaluation Patient Details Name: Betty Jackson MRN: [id number] DOB: 12/10/1934 Today's Date: 07/11/2022   History of Present Illness Pt is 88 yo female presenting via EMS from [facility] for AMS and gereral weakness.  PMH - CVA, ckd, chronic pain, temporal arteritis, first degree AV block, HFpEF, RA, HTN, Afib, CAD, rt hip hemiarthroplasty, lumbar fusion.   Clinical Impression   Pt currently with functional limitations due to the deficits listed below (see OT Problem List). Prior to admit, pt was staying at [facility] receiving assist for all BADL tasks and utilizing w/c for mobility. Recommend pt return to SNF and be evaluated by staff OT to determine skilled therapy needs. No acute OT needs identified at this time as pt appears to be at baseline from SNF.         Recommendations for follow up therapy are one component of a multi-disciplinary discharge planning process, led by the attending physician.  Recommendations may be updated based on patient status, additional functional criteria and insurance authorization.   Assistance Recommended at Discharge Frequent or constant Supervision/Assistance  Patient can return home with the following Two people to help with walking and/or transfers;Two people to help with bathing/dressing/bathroom;Assistance with feeding;Assist for transportation    Functional Status Assessment  Patient has had a recent decline in their functional status and/or demonstrates limited ability to make significant improvements in function in a reasonable and predictable amount of time  Equipment Recommendations  None recommended by OT       Precautions / Restrictions Precautions Precautions: Fall Restrictions Weight Bearing Restrictions: No      Mobility Bed Mobility Overal bed mobility: Needs Assistance Bed Mobility: Rolling, Sidelying to Sit Rolling: Max assist, +2 for physical assistance Sidelying to sit: Total assist, +2  for physical assistance       General bed mobility comments: VC for hand placement, sequencing, and use of bed rails during rolling and transitioning from sidelying to sitting EOB Patient Response: Flat affect, Cooperative  Transfers Overall transfer level: Needs assistance Equipment used: 2 person hand held assist Transfers: Sit to/from Stand, Bed to chair/wheelchair/BSC Sit to Stand: Max assist, +2 physical assistance, From elevated surface Stand pivot transfers: +2 physical assistance, Max assist         General transfer comment: 2 person face to face transfer technique.      Balance Overall balance assessment: Needs assistance Sitting-balance support: Bilateral upper extremity supported, Feet supported Sitting balance-[name]: Poor Sitting balance - Comments: intermittent min assist to maintain sitting balance. Postural control: Right lateral lean Standing balance support: Bilateral upper extremity supported, During functional activity Standing balance-[name]: Zero Standing balance comment: reliant on therapist for balance         ADL either performed or assessed with clinical judgement   ADL Overall ADL's : At baseline     General ADL Comments: Pt receives assistance from staff at [facility] for all BADL tasks. Pt currently requires total assist at bed level.     Vision Baseline Vision/History: 0 No visual deficits Ability to See in Adequate Light: 1 Impaired Patient Visual Report: No change from baseline              Pertinent Vitals/Pain Pain Assessment Pain Assessment: Faces Faces Pain Scale: Hurts a little bit Pain Location: generalized during bed mobility Pain Descriptors / Indicators: Grimacing Pain Intervention(s): Monitored during session, Repositioned     Hand Dominance Right   Extremity/Trunk Assessment Upper Extremity Assessment Upper Extremity Assessment: Generalized  weakness   Lower Extremity Assessment Lower Extremity Assessment:  Generalized weakness   Cervical / Trunk Assessment Cervical / Trunk Assessment: Normal   Communication Communication Communication: HOH (has hearing aids in room)   Cognition Arousal/Alertness: Awake/alert (sleepy at end of session) Behavior During Therapy: Flat affect Overall Cognitive Status: No family/caregiver present to determine baseline cognitive functioning          General Comments: oriented to place, birthday. did not know why she was in the hospital. Able to follow one step commands with increased time and hand over hand assist provided as needed during bed mobility.     General Comments  HR elevated to 128 during activity and bed mobility. Once in chair HR decreased to 113. BP in recliner: 116/58            Home Living Family/patient expects to be discharged to:: Skilled nursing facility                 Prior Functioning/Environment Prior Level of Function : Needs assist             Mobility Comments: non ambulatory. uses wheelchair. Stand pvt transfers at baseline. ADLs Comments: staff assists with ADLs/IADLs        OT Problem List: Decreased strength;Impaired balance (sitting and/or standing);Decreased activity tolerance         OT Goals(Current goals can be found in the care plan section) Acute Rehab OT Goals Patient Stated Goal: none stated  OT Frequency:  1 time visit    Co-evaluation PT/OT/SLP Co-Evaluation/Treatment: Yes Reason for Co-Treatment: To address functional/ADL transfers   OT goals addressed during session: ADL's and self-care;Proper use of Adaptive equipment and DME      AM-PAC OT "6 Clicks" Daily Activity     Outcome Measure Help from another person eating meals?: Total Help from another person taking care of personal grooming?: Total Help from another person toileting, which includes using toliet, bedpan, or urinal?: Total Help from another person bathing (including washing, rinsing, drying)?: Total Help from another  person to put on and taking off regular upper body clothing?: Total Help from another person to put on and taking off regular lower body clothing?: Total 6 Click Score: 6   End of Session Equipment Utilized During Treatment: Gait belt;Oxygen Nurse Communication: Mobility status  Activity Tolerance: Patient limited by fatigue;Patient tolerated treatment well Patient left: in chair;with call bell/phone within reach;with chair alarm set  OT Visit Diagnosis: Unsteadiness on feet (R26.81);Muscle weakness (generalized) (M62.81)                Time: 7829-5621 OT Time Calculation (min): 32 min Charges:  OT General Charges $OT Visit: 1 Visit OT Evaluation $OT Eval High Complexity: 1 High  [employer], OTR/L,CBIS  Supplemental OT - [facility] and [facility] Secure Chat Preferred    Desirey Keahey, Charisse March 07/11/2022, 10:18 AM

## 2022-07-11 NOTE — Progress Notes (Signed)
Pharmacy Antibiotic Note  Betty Jackson is a 88 y.o. female admitted on 07/10/2022 presenting from facility with AMS, concern for sepsis.  Pharmacy has been consulted for vancomycin and cefepime dosing.  Tm 100.8, wbc 11, renal function stable.   Plan: Continue vancomycin 750 mg IV q 24h (eAUC 466) Cefepime 2g IV q12h  Monitor renal function, Cx and clinical progression to narrow Vancomycin levels as indicated  Height: 5\' 4"  (162.6 cm) Weight: 78.8 kg (173 lb 11.6 oz) IBW/kg (Calculated) : 54.7  Temp (24hrs), Avg:99.4 F (37.4 C), Min:97.8 F (36.6 C), Max:100.8 F (38.2 C)  Recent Labs  Lab 07/10/22 1505 07/11/22 0150 07/11/22 0705  WBC 10.6* 12.2* 11.0*  CREATININE 0.90 0.96  --   LATICACIDVEN 1.5  --   --      Estimated Creatinine Clearance: 42.7 mL/min (by C-G formula based on SCr of 0.96 mg/dL).    Allergies  Allergen Reactions   Alendronate Sodium     Suspected cause of severe esophagitis (per EGD 08/05/21)   Dextromethorphan Rash    Antimicrobials this admission:  4/12 vancomycin >> 4/12 cefepime >>   Microbiology this admission:  4/12 Blood cx: ngtd 4/12 MRSA PCR cx: sent  4/12 Urine cx: sent   Andreas Ohm, PharmD Pharmacy Resident  07/11/2022 1:15 PM

## 2022-07-11 NOTE — Evaluation (Signed)
Clinical/Bedside Swallow Evaluation Patient Details  Name: Betty Jackson MRN: [id number] Date of Birth: Feb 25, 1935  Today's Date: 07/11/2022 Time: SLP Start Time (ACUTE ONLY): 1352 SLP Stop Time (ACUTE ONLY): 1414 SLP Time Calculation (min) (ACUTE ONLY): 22 min  Past Medical History:  Past Medical History:  Diagnosis Date   Acute respiratory failure with hypoxia (HCC) 05/20/2017   Anemia, unspecified 10/28/2012   Anxiety state, unspecified 10/28/2012   CAD (coronary artery disease)    a. Stent RCA in [facility];  b. Cath approx 2009 - nonobs per pt report.   Cataract    immature on the left eye   Chronic insomnia 02/07/2013   Chronic lower back pain    scoliosis   CKD (chronic kidney disease) stage 3, GFR 30-59 ml/min (HCC)    CVA (cerebral infarction) 10/29/2012   DDD (degenerative disc disease)    Diverticulosis    GERD (gastroesophageal reflux disease) 09/01/2010   Hemorrhoids    Herniated nucleus pulposus, L5-S1, right 11/04/2015   Hyperlipidemia    takes Lipitor daily   Hypertension    takes Amlodipine,Losartan,Metoprolol,and HCTZ daily   Incontinence of urine    Insomnia    takes Restoril nightly   Lumbar stenosis 04/25/2013   Major depressive disorder, recurrent episode, moderate (HCC) 07/16/2013   Osteoporosis    PAF (paroxysmal atrial fibrillation) (HCC) 2011   a. lone epidode in 2011 according to notes.   Rheumatoid arthritis (HCC)    Scoliosis    Sepsis (HCC) 05/2019   Stroke (HCC)    Temporal arteritis (HCC) 2011   a. followed @ [facility]; potential flareup 10/28/2012/notes 10/28/2012   Vocal cord dysfunction    "they don't operate properly" (10/28/2012)   Past Surgical History:  Past Surgical History:  Procedure Laterality Date   ABDOMINAL HYSTERECTOMY  ~ 1984   vaginally   BACK SURGERY  7-44yrs ago   X Stop   BIOPSY  05/23/2022   Procedure: BIOPSY;  Surgeon: Tressia Danas, MD;  Location: [facility] ENDOSCOPY;  Service: Gastroenterology;;   BLADDER  SUSPENSION  2001   BREAST BIOPSY Right    CATARACT EXTRACTION W/ INTRAOCULAR LENS IMPLANT Right ~ 08/2012   CHEST TUBE INSERTION Left 03/08/2019   Procedure: Chest Tube Insertion;  Surgeon: Kerin Perna, MD;  Location: [facility] OR;  Service: Thoracic;  Laterality: Left;   COLONOSCOPY  01/26/2012   Procedure: COLONOSCOPY;  Surgeon: Meryl Dare, MD,FACG;  Location: [facility] ENDOSCOPY;  Service: Endoscopy;  Laterality: N/A;  note the EGD is possible   COLONOSCOPY WITH PROPOFOL N/A 09/10/2021   Procedure: COLONOSCOPY WITH PROPOFOL;  Surgeon: Iva Boop, MD;  Location: [facility] ENDOSCOPY;  Service: Gastroenterology;  Laterality: N/A;   CORONARY ANGIOPLASTY WITH STENT PLACEMENT  2006   X 1 stent   EPIDURAL BLOCK INJECTION     ESOPHAGOGASTRODUODENOSCOPY  01/26/2012   Procedure: ESOPHAGOGASTRODUODENOSCOPY (EGD);  Surgeon: Meryl Dare, MD,FACG;  Location: [facility] ENDOSCOPY;  Service: Endoscopy;  Laterality: N/A;   ESOPHAGOGASTRODUODENOSCOPY (EGD) WITH PROPOFOL N/A 08/05/2021   Procedure: ESOPHAGOGASTRODUODENOSCOPY (EGD) WITH PROPOFOL;  Surgeon: Sherrilyn Rist, MD;  Location: [facility] ENDOSCOPY;  Service: Gastroenterology;  Laterality: N/A;   ESOPHAGOGASTRODUODENOSCOPY (EGD) WITH PROPOFOL N/A 09/10/2021   Procedure: ESOPHAGOGASTRODUODENOSCOPY (EGD) WITH PROPOFOL;  Surgeon: Iva Boop, MD;  Location: [facility] ENDOSCOPY;  Service: Gastroenterology;  Laterality: N/A;   ESOPHAGOGASTRODUODENOSCOPY (EGD) WITH PROPOFOL N/A 05/23/2022   Procedure: ESOPHAGOGASTRODUODENOSCOPY (EGD) WITH PROPOFOL;  Surgeon: Tressia Danas, MD;  Location: [facility] ENDOSCOPY;  Service: Gastroenterology;  Laterality:  N/A;   FINE NEEDLE ASPIRATION Right 09/26/2019   Procedure: FINE NEEDLE ASPIRATION;  Surgeon: Myrene Galas, MD;  Location: [facility] OR;  Service: Orthopedics;  Laterality: Right;   FOREIGN BODY REMOVAL  08/05/2021   Procedure: FOREIGN BODY REMOVAL;  Surgeon: Sherrilyn Rist, MD;  Location: [facility] ENDOSCOPY;  Service: Gastroenterology;;    HEMIARTHROPLASTY HIP Right 2012   IR EPIDUROGRAPHY  04/20/2019   LAPAROSCOPIC CHOLECYSTECTOMY  2001   LUMBAR FUSION  03/2013   LUMBAR LAMINECTOMY/DECOMPRESSION MICRODISCECTOMY Right 11/04/2015   Procedure: Right Lumbar Five-Sacral One Microdiskectomy;  Surgeon: Barnett Abu, MD;  Location: [facility] NEURO ORS;  Service: Neurosurgery;  Laterality: Right;  Right L5-S1 Microdiskectomy   moles removed that required stiches     one on leg and one on face   ORIF FEMUR FRACTURE Right 09/26/2019   Procedure: OPEN REDUCTION INTERNAL FIXATION (ORIF) DISTAL FEMUR FRACTURE;  Surgeon: Myrene Galas, MD;  Location: [facility] OR;  Service: Orthopedics;  Laterality: Right;   ORIF FEMUR FRACTURE Right 10/17/2019   Procedure: OPEN REDUCTION INTERNAL FIXATION (ORIF) DISTAL FEMUR FRACTURE;  Surgeon: Myrene Galas, MD;  Location: [facility] OR;  Service: Orthopedics;  Laterality: Right;   POLYPECTOMY  09/10/2021   Procedure: POLYPECTOMY;  Surgeon: Iva Boop, MD;  Location: [facility] ENDOSCOPY;  Service: Gastroenterology;;   SUBXYPHOID PERICARDIAL WINDOW N/A 03/08/2019   Procedure: SUBXYPHOID PERICARDIAL WINDOW;  Surgeon: Kerin Perna, MD;  Location: [facility] OR;  Service: Thoracic;  Laterality: N/A;   TEE WITHOUT CARDIOVERSION N/A 03/08/2019   Procedure: TRANSESOPHAGEAL ECHOCARDIOGRAM (TEE);  Surgeon: Donata Clay, Theron Arista, MD;  Location: [facility] OR;  Service: Thoracic;  Laterality: N/A;   TEMPORAL ARTERY BIOPSY / LIGATION Bilateral 2011   TONSILLECTOMY AND ADENOIDECTOMY     at age 89   TUBAL LIGATION  ~ 60   X-STOP IMPLANTATION  ~ 2010   "lower back" (10/28/2012)   HPI:  Pt is a 88 y.o. female presenting via EMS from [facility] for AMS and gereral weakness. CXR (4/12) significant for "Increased interstitial markings in the parahilar regions and lower lung fields may suggest interstitial pulmonary edema or interstitial pneumonia". MBS (09/2019) revealed oropharyngeal swallow WNL, no aspiration observed only transient penetration (PAS 2). Regular  diet/thin liquids recommended at that time. BSE (04/2022) noted coughing with liquids in reclined position, but without s/s with repositioning. Regular diet/thin liquids recommended and SLP signed-off. PMH: CVA, ckd, chronic pain, temporal arteritis, first degree AV block, HFpEF, RA, HTN, Afib, CAD, rt hip hemiarthroplasty, lumbar fusion, and vocal cord dysfunction.    Assessment / Plan / Recommendation  Clinical Impression  Pt presents with functional oropharyngeal swallow at bedside. Pt alert and repositioned upright in chair, daughter present in room. At [facility], pt consumes a regular diet/thin liquids with very few occasions of difficulty swallowing per daughter. Oral mechanism examination WFL and upper/lower dentures placed. WIth assist, pt consumed single/consecutive sips of thin liquids by straw, bites of puree and regular textured solids without s/sx of aspiration. Mastication of regular solid was mildly prolonged, but functional and oral clearance full post intake. Recommend regular diet/thin liquids with adherence to universal swallow precuations including upright positioning for all intake. No further SLP f/u warranted at this time and will s/o.  SLP Visit Diagnosis: Dysphagia, unspecified (R13.10)    Aspiration Risk  Mild aspiration risk    Diet Recommendation Regular;Thin liquid   Liquid Administration via: Straw;Cup Medication Administration: Whole meds with liquid Supervision: Staff to assist with self feeding;Full supervision/cueing for compensatory strategies Compensations: Minimize  environmental distractions;Slow rate;Small sips/bites Postural Changes: Seated upright at 90 degrees    Other  Recommendations Oral Care Recommendations: Oral care BID    Recommendations for follow up therapy are one component of a multi-disciplinary discharge planning process, led by the attending physician.  Recommendations may be updated based on patient status, additional functional criteria and  insurance authorization.  Follow up Recommendations No SLP follow up      Assistance Recommended at Discharge    Functional Status Assessment Patient has not had a recent decline in their functional status  Frequency and Duration            Prognosis        Swallow Study   General Date of Onset: 07/10/22 HPI: Pt is a 88 y.o. female presenting via EMS from [facility] for AMS and gereral weakness. CXR (4/12) significant for "Increased interstitial markings in the parahilar regions and lower lung fields may suggest interstitial pulmonary edema or interstitial pneumonia". MBS (09/2019) revealed oropharyngeal swallow WNL, no aspiration observed only transient penetration (PAS 2). Regular diet/thin liquids recommended at that time. BSE (04/2022) noted coughing with liquids in reclined position, but without s/s with repositioning. Regular diet/thin liquids recommended and SLP signed-off. PMH: CVA, ckd, chronic pain, temporal arteritis, first degree AV block, HFpEF, RA, HTN, Afib, CAD, rt hip hemiarthroplasty, lumbar fusion, and vocal cord dysfunction. Type of Study: Bedside Swallow Evaluation Previous Swallow Assessment: see HPI Diet Prior to this Study: NPO Temperature Spikes Noted: Yes (100.8) Respiratory Status: Nasal cannula History of Recent Intubation: No Behavior/Cognition: Alert;Cooperative;Pleasant mood;Confused Oral Cavity Assessment: Within Functional Limits Oral Care Completed by SLP: No Oral Cavity - Dentition: Dentures, top;Dentures, bottom Vision: Functional for self-feeding Self-Feeding Abilities: Able to feed self;Needs assist Patient Positioning: Upright in chair Baseline Vocal Quality: Low vocal intensity;Hoarse (hx vocal cord dysfunction) Volitional Cough: Strong Volitional Swallow: Able to elicit    Oral/Motor/Sensory Function Overall Oral Motor/Sensory Function: Within functional limits   Ice Chips Ice chips: Not tested   Thin Liquid Thin Liquid: Within  functional limits Presentation: Self Fed;Straw    Nectar Thick Nectar Thick Liquid: Not tested   Honey Thick Honey Thick Liquid: Not tested   Puree Puree: Within functional limits Presentation: Self Fed;Spoon   Solid     Solid: Within functional limits Presentation: Self Fed       Avie Echevaria, MA, CCC-SLP Acute Rehabilitation Services Office Number: [phone]  Paulette Blanch 07/11/2022,2:27 PM

## 2022-07-11 NOTE — Progress Notes (Signed)
Verbal order received to leave foley in from Dr Janeal Holmes.

## 2022-07-12 DIAGNOSIS — I480 Paroxysmal atrial fibrillation: Secondary | ICD-10-CM | POA: Diagnosis present

## 2022-07-12 LAB — URINE CULTURE: Culture: NO GROWTH

## 2022-07-12 LAB — CULTURE, BLOOD (ROUTINE X 2): Special Requests: ADEQUATE

## 2022-07-12 MED ORDER — AMIODARONE HCL 200 MG PO TABS
200.0000 mg | ORAL_TABLET | Freq: Every day | ORAL | Status: DC
  Administered 2022-07-12 – 2022-07-13 (×2): 200 mg via ORAL
  Filled 2022-07-12 (×2): qty 1

## 2022-07-12 MED ORDER — ATORVASTATIN CALCIUM 80 MG PO TABS
80.0000 mg | ORAL_TABLET | Freq: Every day | ORAL | Status: DC
  Administered 2022-07-12 – 2022-07-14 (×3): 80 mg via ORAL
  Filled 2022-07-12 (×3): qty 1

## 2022-07-12 MED ORDER — HYDROCODONE-ACETAMINOPHEN 5-325 MG PO TABS
1.0000 | ORAL_TABLET | Freq: Two times a day (BID) | ORAL | Status: DC | PRN
  Administered 2022-07-12 – 2022-07-15 (×3): 1 via ORAL
  Filled 2022-07-12 (×4): qty 1

## 2022-07-12 MED ORDER — ENSURE ENLIVE PO LIQD
120.0000 mL | Freq: Every day | ORAL | Status: DC
  Administered 2022-07-12 – 2022-07-13 (×2): 120 mL via ORAL
  Filled 2022-07-12 (×4): qty 237

## 2022-07-12 MED ORDER — DILTIAZEM HCL ER COATED BEADS 240 MG PO CP24
240.0000 mg | ORAL_CAPSULE | Freq: Every evening | ORAL | Status: DC
  Administered 2022-07-12 – 2022-07-14 (×3): 240 mg via ORAL
  Filled 2022-07-12 (×3): qty 1

## 2022-07-12 MED ORDER — FENTANYL 25 MCG/HR TD PT72
1.0000 | MEDICATED_PATCH | TRANSDERMAL | Status: DC
  Administered 2022-07-12 – 2022-07-15 (×2): 1 via TRANSDERMAL
  Filled 2022-07-12 (×2): qty 1

## 2022-07-12 MED ORDER — ESCITALOPRAM OXALATE 10 MG PO TABS
10.0000 mg | ORAL_TABLET | Freq: Every day | ORAL | Status: DC
  Administered 2022-07-12 – 2022-07-15 (×4): 10 mg via ORAL
  Filled 2022-07-12 (×4): qty 1

## 2022-07-12 MED ORDER — PREDNISONE 10 MG PO TABS
10.0000 mg | ORAL_TABLET | Freq: Every day | ORAL | Status: DC
  Administered 2022-07-12 – 2022-07-15 (×4): 10 mg via ORAL
  Filled 2022-07-12 (×4): qty 1

## 2022-07-12 MED ORDER — HYDROCODONE-ACETAMINOPHEN 5-325 MG PO TABS
1.0000 | ORAL_TABLET | Freq: Every day | ORAL | Status: DC
  Administered 2022-07-12 – 2022-07-14 (×3): 1 via ORAL
  Filled 2022-07-12 (×3): qty 1

## 2022-07-12 MED ORDER — ORAL CARE MOUTH RINSE: 15.0000 mL | OROMUCOSAL | Status: DC | PRN

## 2022-07-12 MED ORDER — METOPROLOL TARTRATE 100 MG PO TABS
100.0000 mg | ORAL_TABLET | Freq: Two times a day (BID) | ORAL | Status: DC
  Administered 2022-07-12 – 2022-07-13 (×3): 100 mg via ORAL
  Filled 2022-07-12 (×4): qty 1

## 2022-07-12 MED ORDER — QUETIAPINE FUMARATE 25 MG PO TABS
25.0000 mg | ORAL_TABLET | Freq: Every day | ORAL | Status: DC
  Administered 2022-07-12 – 2022-07-14 (×3): 25 mg via ORAL
  Filled 2022-07-12 (×3): qty 1

## 2022-07-12 MED ORDER — APIXABAN 5 MG PO TABS
5.0000 mg | ORAL_TABLET | Freq: Two times a day (BID) | ORAL | Status: DC
  Administered 2022-07-12 – 2022-07-15 (×7): 5 mg via ORAL
  Filled 2022-07-12 (×7): qty 1

## 2022-07-12 MED ORDER — OXYCODONE HCL 5 MG PO TABS
2.5000 mg | ORAL_TABLET | ORAL | Status: DC | PRN
  Administered 2022-07-12: 2.5 mg via ORAL
  Filled 2022-07-12: qty 1

## 2022-07-12 NOTE — Progress Notes (Signed)
Daily Progress Note Intern Pager: [phone]  Patient name: Betty Jackson Medical record number: [id number] Date of birth: 05/30/1934 Age: 88 y.o. Gender: female  Primary Care Provider: Keane Police, MD Consultants: None Code Status: DNR  Pt Overview and Major Events to Date:  4/12- admitted, on vancomycin and cefepime  Assessment and Plan: Dawna Jakes is an 88 yo female with a PMH of PAF on eliquis, chronic pain on opioids, MDD, HFpEF, GCA on steroids, who presented with AMS c/f metabolic encephalopathy in the setting of likely pneumonia.   * Altered mental status, unspecified Slow improvement in mentation with antibiotic therapy for her pneumonia. Metabolic encephalopathy seems the most likely explanation for her presentation at this time. Able to converse and eat this morning. - MRSA nasal swab neg, d/c vanc - Continue cefepime, likely transition to PO tomorrow if Bcx remain negative and mental status allows - Continue to follow blood cultures - Had held narcotics, restart slowly with 2.5mg  oxycodone, titrate up to her home norco dose as mental status allows - Delirium precautions  - Fall precautions  - Second hospitalization in two months, if does not improve rapidly, may be worthwhile involving palliative care, it appears she does follow with them outpatient -- Restarting home prednisone to avoid potential adrenal insufficiency contribution  Pneumonia Likely cause of #1. Remains on 3L [place]. - Continue cefepime - Incentive spirometer - Flutter valve  Paroxysmal atrial fibrillation In A fib since yesterday, HR 100-110s. Off home regimen since admission as home meds were being held pending med rec and mental status. MS improved today, should be able to take pills. - Will restart home Eliquis, Amiodarone, Diltiazem, and metoprolol  Hyperbilirubinemia Patient has isolated hyperbilirubinemia. AST, ALT, alk phos all within normal limits. KUB with nonobstructive gas  pattern no obvious abdominal abnormalities.  No jaundice on exam.   - A.m. CMP, if still abnormal could get fractionated hemoglobin, haptoglobin, smear, right upper quadrant ultrasound  Anemia Hgb 11.1. Slight macrocytosis to 100.3.Could have some nutritional deficiencies given history of difficulty eating 2/2 vocal cord dysfunction. 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 Barriers include mental status and respiratory status.   Subjective:  Sleeping on my arrival to the room, she did awaken.  She is able to engage and answer questions mostly appropriately, the she is hard to understand.  Has been taking sips of water and trying to eat this morning.  Objective: Temp:  [97.8 F (36.6 C)-98.7 F (37.1 C)] 98.5 F (36.9 C) (04/14 0734) Pulse Rate:  [84-106] 97 (04/14 0734) Resp:  [19-30] 20 (04/14 0734) BP: (108-162)/(41-61) 117/52 (04/14 0734) SpO2:  [94 %-100 %] 94 % (04/14 0734) Physical Exam: General: Chronically ill-appearing older female, NAD, sleeping comfortably Cardiovascular: Irregularly irregular, no murmur, trace edema to bilateral lower extremities Respiratory: Normal work of breathing on 3 L nasal cannula, poor air movement throughout, diminished across the bases Abdomen: Nontender, nondistended  Laboratory: Most recent CBC Lab Results  Component Value Date   WBC 11.0 (H) 07/11/2022   HGB 11.1 (L) 07/11/2022   HCT 35.8 (L) 07/11/2022   MCV 100.3 (H) 07/11/2022   PLT 152 07/11/2022   Most recent BMP    Latest Ref Rng & Units 07/11/2022    1:50 AM  BMP  Glucose 70 -  99 mg/dL 94   BUN 8 - 23 mg/dL 12   Creatinine 9.70 - 1.00 mg/dL 2.63   Sodium 785 - 885 mmol/L 137   Potassium 3.5 - 5.1 mmol/L 3.7   Chloride 98 - 111 mmol/L 109   CO2 22 - 32  mmol/L 22   Calcium 8.9 - 10.3 mg/dL 8.0      Imaging/Diagnostic Tests: No new imaging, tests  Alicia Amel, MD 07/12/2022, 10:04 AM  PGY-2, [facility] FPTS Intern pager: [phone], text pages welcome Secure chat group [facility] Teaching Service

## 2022-07-12 NOTE — Assessment & Plan Note (Addendum)
In A fib since yesterday, HR 100-110s. Off home regimen since admission as home meds were being held pending med rec and mental status. MS improved today, should be able to take pills. - Will restart home Eliquis, Amiodarone, Diltiazem, and metoprolol

## 2022-07-12 NOTE — Progress Notes (Addendum)
Long conversation at bedside with patient's daughter regarding goals of care for this hospitalization, particularly as regards her chronic pain: Patient has a 10 year history of difficult to control chronic pain, has tried many different treatment modalities and has followed with pain management for the same. Has a history of admissions similar to this one where she presents with decreased LOC and her narcotics are held. Per the daughter's report, when this happens, she often becomes increasingly agitated and pain seems to precipitate some sundowning/psychosis in the evenings.  For this reason she takes Seroquel 25 mg nightly at home. Her home pain regimen is 25 mcg/h of fentanyl delivered via patch and Norco 5-325 mg nightly with additional Norco 1-2x daily PRN.  Her daughter feels that she is starting to develop worsening pain symptoms after fentanyl patch was removed yesterday.  The family understands that while narcotics can contribute to decreased level of consciousness, they note that she is 88 years old with multi morbid medical conditions and likely is approaching the end of her life.  They reviewed treating her chronic pain as a palliative measure and except side effects such as decreased level of consciousness and increased aspiration risk.  She does follow with palliative care on an outpatient basis.  Mental status is improved this morning, it is ultimately impossible to parse whether this is due to treatment of her pneumonia versus discontinuation of the fentanyl patch yesterday.  However, in light of the family/patient's wishes I do think it is reasonable to treat her pain aggressively.  Aside from a new oxygen requirement which is attributable to her pneumonia, her vitals have remained stable.  If she begins to show evidence of respiratory depression, we will revisit this conversation with the family, likely with the assistance of our palliative care team. -Will reapply fentanyl patch, 25  mcg/hour -Norco 5-325mg  nightly  -Norco 5-325mg  BID PRN -Re-start Seroquel 25mg  nightly    J Dorothyann Gibbs, MD

## 2022-07-12 NOTE — Assessment & Plan Note (Signed)
On room air.  - Cefdinir day 4/7

## 2022-07-13 LAB — MAGNESIUM: Magnesium: 1.7 mg/dL (ref 1.7–2.4)

## 2022-07-13 LAB — CBC
HCT: 34.3 % — ABNORMAL LOW (ref 36.0–46.0)
HCT: 27.7 % — ABNORMAL LOW (ref 36.0–46.0)
Hemoglobin: 10.5 g/dL — ABNORMAL LOW (ref 12.0–15.0)
Hemoglobin: 9 g/dL — ABNORMAL LOW (ref 12.0–15.0)
MCH: 30.3 pg (ref 26.0–34.0)
MCH: 31.5 pg (ref 26.0–34.0)
MCHC: 30.6 g/dL (ref 30.0–36.0)
MCHC: 32.5 g/dL (ref 30.0–36.0)
MCV: 98.8 fL (ref 80.0–100.0)
MCV: 96.9 fL (ref 80.0–100.0)
Platelets: 155 10*3/uL (ref 150–400)
Platelets: 161 10*3/uL (ref 150–400)
RBC: 3.47 MIL/uL — ABNORMAL LOW (ref 3.87–5.11)
RBC: 2.86 MIL/uL — ABNORMAL LOW (ref 3.87–5.11)
RDW: 14.6 % (ref 11.5–15.5)
RDW: 14.4 % (ref 11.5–15.5)
WBC: 7.6 10*3/uL (ref 4.0–10.5)
WBC: 7.8 10*3/uL (ref 4.0–10.5)
nRBC: 0 % (ref 0.0–0.2)
nRBC: 0 % (ref 0.0–0.2)

## 2022-07-13 LAB — FOLATE: Folate: 14.1 ng/mL (ref >5.9)

## 2022-07-13 LAB — COMPREHENSIVE METABOLIC PANEL
ALT: 14 U/L (ref 0–44)
ALT: 13 U/L (ref 0–44)
AST: 20 U/L (ref 15–41)
AST: 17 U/L (ref 15–41)
Albumin: 2.4 g/dL — ABNORMAL LOW (ref 3.5–5.0)
Albumin: 2.2 g/dL — ABNORMAL LOW (ref 3.5–5.0)
Alkaline Phosphatase: 80 U/L (ref 38–126)
Alkaline Phosphatase: 61 U/L (ref 38–126)
Anion gap: 6 (ref 5–15)
Anion gap: 9 (ref 5–15)
BUN: 21 mg/dL (ref 8–23)
BUN: 23 mg/dL (ref 8–23)
CO2: 23 mmol/L (ref 22–32)
CO2: 23 mmol/L (ref 22–32)
Calcium: 8.6 mg/dL — ABNORMAL LOW (ref 8.9–10.3)
Calcium: 8.5 mg/dL — ABNORMAL LOW (ref 8.9–10.3)
Chloride: 112 mmol/L — ABNORMAL HIGH (ref 98–111)
Chloride: 109 mmol/L (ref 98–111)
Creatinine, Ser: 1.15 mg/dL — ABNORMAL HIGH (ref 0.44–1.00)
Creatinine, Ser: 1.14 mg/dL — ABNORMAL HIGH (ref 0.44–1.00)
GFR, Estimated: 46 mL/min — ABNORMAL LOW (ref >60)
GFR, Estimated: 47 mL/min — ABNORMAL LOW (ref >60)
Glucose, Bld: 110 mg/dL — ABNORMAL HIGH (ref 70–99)
Glucose, Bld: 113 mg/dL — ABNORMAL HIGH (ref 70–99)
Potassium: 4.4 mmol/L (ref 3.5–5.1)
Potassium: 4.2 mmol/L (ref 3.5–5.1)
Sodium: 141 mmol/L (ref 135–145)
Sodium: 141 mmol/L (ref 135–145)
Total Bilirubin: 0.9 mg/dL (ref 0.3–1.2)
Total Bilirubin: 0.9 mg/dL (ref 0.3–1.2)
Total Protein: 5.6 g/dL — ABNORMAL LOW (ref 6.5–8.1)
Total Protein: 4.9 g/dL — ABNORMAL LOW (ref 6.5–8.1)

## 2022-07-13 LAB — FERRITIN: Ferritin: 282 ng/mL (ref 11–307)

## 2022-07-13 LAB — RETICULOCYTES
Immature Retic Fract: 16.7 % — ABNORMAL HIGH (ref 2.3–15.9)
RBC.: 3.48 MIL/uL — ABNORMAL LOW (ref 3.87–5.11)
Retic Count, Absolute: 70.7 10*3/uL (ref 19.0–186.0)
Retic Ct Pct: 2.1 % (ref 0.4–3.1)

## 2022-07-13 LAB — CULTURE, BLOOD (ROUTINE X 2)

## 2022-07-13 LAB — IRON AND TIBC
Iron: 21 ug/dL — ABNORMAL LOW (ref 28–170)
Saturation Ratios: 13 % (ref 10.4–31.8)
TIBC: 160 ug/dL — ABNORMAL LOW (ref 250–450)
UIBC: 139 ug/dL

## 2022-07-13 LAB — TROPONIN I (HIGH SENSITIVITY): Troponin I (High Sensitivity): 14 ng/L (ref <18)

## 2022-07-13 LAB — VITAMIN B12: Vitamin B-12: 1142 pg/mL — ABNORMAL HIGH (ref 180–914)

## 2022-07-13 MED ORDER — SODIUM CHLORIDE 0.9 % IV SOLN: INTRAVENOUS | Status: DC

## 2022-07-13 MED ORDER — CEFDINIR 300 MG PO CAPS
300.0000 mg | ORAL_CAPSULE | Freq: Two times a day (BID) | ORAL | Status: AC
  Administered 2022-07-13 – 2022-07-14 (×3): 300 mg via ORAL
  Filled 2022-07-13 (×3): qty 1

## 2022-07-13 MED ORDER — SODIUM CHLORIDE 0.9 % IV BOLUS
250.0000 mL | Freq: Once | INTRAVENOUS | Status: AC
  Administered 2022-07-13: 250 mL via INTRAVENOUS

## 2022-07-13 NOTE — Progress Notes (Signed)
Physical Therapy Treatment Patient Details Name: Betty Jackson MRN: [id number] DOB: 10-12-1934 Today's Date: 07/13/2022   History of Present Illness Pt is 88 yo female presenting via EMS from [facility] for AMS and gereral weakness.  PMH - CVA, ckd, chronic pain, temporal arteritis, first degree AV block, HFpEF, RA, HTN, Afib, CAD, rt hip hemiarthroplasty, lumbar fusion.    PT Comments    Pt very lethargic hindering ability to progress OOB this session. Bed placed in chair position x 5 minutes for ROM exercises BUE/LE, which were primarily completed passively. Bed then transitioned out of chair position. Pt assisted into R sidelying for comfort/pressure relief, total assist. Pillows utilized under L shld/hip, between knees, and under LUE. Pt on 1.5L O2 throughout session with SpO2 97%. HR in 90s. Pt sleeping prior to therapist exiting room.    Recommendations for follow up therapy are one component of a multi-disciplinary discharge planning process, led by the attending physician.  Recommendations may be updated based on patient status, additional functional criteria and insurance authorization.  Follow Up Recommendations  Can patient physically be transported by private vehicle: No    Assistance Recommended at Discharge Frequent or constant Supervision/Assistance  Patient can return home with the following Two people to help with walking and/or transfers;Assistance with cooking/housework;Assist for transportation;Two people to help with bathing/dressing/bathroom;Direct supervision/assist for medications management   Equipment Recommendations  None recommended by PT    Recommendations for Other Services       Precautions / Restrictions Precautions Precautions: Fall     Mobility  Bed Mobility Overal bed mobility: Needs Assistance Bed Mobility: Rolling Rolling: Total assist         General bed mobility comments: rolling to position in R sidelying    Transfers                    General transfer comment: Bed placed in chair position x 5 minutes    Ambulation/Gait               General Gait Details: nonambulatory at baseline, w/c dependent   Stairs             Wheelchair Mobility    Modified Rankin (Stroke Patients Only)       Balance                                            Cognition Arousal/Alertness: Lethargic, Suspect due to medications Behavior During Therapy: Flat affect Overall Cognitive Status: No family/caregiver present to determine baseline cognitive functioning                                 General Comments: Speaking in a soft voice. Falling asleep without continuous stimuli/cues.        Exercises Other Exercises Other Exercises: P/AAROM BUE/LE    General Comments General comments (skin integrity, edema, etc.): HR in 90s. SpO2 97% on 1.5L. BP 135/64      Pertinent Vitals/Pain Pain Assessment Pain Assessment: Faces Faces Pain Scale: Hurts a little bit Pain Location: generalized with mobility Pain Descriptors / Indicators: Discomfort, Grimacing Pain Intervention(s): Monitored during session, Repositioned, Limited activity within patient's tolerance    Home Living  Prior Function            PT Goals (current goals can now be found in the care plan section) Acute Rehab PT Goals Patient Stated Goal: not stated Progress towards PT goals: Not progressing toward goals - comment (lethargy)    Frequency    Min 2X/week      PT Plan Current plan remains appropriate    Co-evaluation              AM-PAC PT "6 Clicks" Mobility   Outcome Measure  Help needed turning from your back to your side while in a flat bed without using bedrails?: Total Help needed moving from lying on your back to sitting on the side of a flat bed without using bedrails?: Total Help needed moving to and from a bed to a chair (including a  wheelchair)?: Total Help needed standing up from a chair using your arms (e.g., wheelchair or bedside chair)?: Total Help needed to walk in hospital room?: Total Help needed climbing 3-5 steps with a railing? : Total 6 Click Score: 6    End of Session Equipment Utilized During Treatment: Oxygen Activity Tolerance: Patient limited by lethargy;Patient limited by fatigue Patient left: in bed;with call bell/phone within reach;with bed alarm set Nurse Communication: Mobility status PT Visit Diagnosis: Other abnormalities of gait and mobility (R26.89);Muscle weakness (generalized) (M62.81)     Time: 5498-2641 PT Time Calculation (min) (ACUTE ONLY): 17 min  Charges:  $Therapeutic Activity: 8-22 mins                     Ferd Glassing., PT  Office # [phone]    Ilda Foil 07/13/2022, 8:23 AM

## 2022-07-13 NOTE — [place] FL2 (Signed)
[place] MEDICAID FL2 LEVEL OF CARE FORM     IDENTIFICATION  Patient Name: Betty Jackson Birthdate: [birth year] Sex: female Admission Date (Current Location): 07/10/2022  [facility] and [place] Number:  Producer, television/film/video and Address:  [facility] [place], [address] [place], [place] [address]      Provider Number: [id number]  Attending Physician Name and Address:  Latrelle Dodrill, MD  Relative Name and Phone Number:  Efraim Kaufmann (daughter) [phone], Shawna Orleans (daughter) [phone]    Current Level of Care: Hospital Recommended Level of Care: Skilled Nursing Facility Prior Approval Number:    Date Approved/Denied:   PASRR Number: [id number]  Discharge Plan: SNF    Current Diagnoses: Patient Active Problem List   Diagnosis Date Noted   Paroxysmal atrial fibrillation 07/12/2022   Altered mental status, unspecified 07/10/2022   Hyperbilirubinemia 07/10/2022   Gastritis and gastroduodenitis 05/23/2022   Pneumonia 05/20/2022   Urinary tract infection without hematuria    Bleeding hemorrhoids    Rectal ulcer    Hematochezia    Odynophagia    Esophagitis    Lung nodules 09/04/2021   Sepsis secondary to UTI 09/03/2021   Atrial fibrillation with RVR 08/06/2021   Dysphagia 08/05/2021   Acute metabolic encephalopathy 03/17/2021   Hypoxemia 03/15/2021   Acute kidney injury superimposed on chronic kidney disease 03/15/2021   Stroke-like symptoms 07/14/2020   Cough 05/24/2020   Dyspnea 05/24/2020   Optic neuritis 05/24/2020   Osteopenia 05/24/2020   Rash 05/24/2020   Vitamin D deficiency 05/24/2020   Palliative care by specialist    Goals of care, counseling/discussion    DNR (do not resuscitate)    Weakness    Advanced directives, counseling/discussion    Pressure injury of skin 10/18/2019   Closed fracture of right distal femur 10/17/2019   Femur fracture, right 09/25/2019   Chronic diastolic CHF (congestive heart failure) 09/25/2019    HCAP (healthcare-associated pneumonia) 06/19/2019   Encephalopathy 06/19/2019   Lumbar degenerative disc disease    Paroxysmal A-fib    Hypomagnesemia    C. difficile colitis    Diarrhea 05/29/2019   CHF (congestive heart failure) 05/29/2019   Recurrent falls 04/30/2019   Pericardial effusion without cardiac tamponade 03/29/2019   Hypoalbuminemia 03/27/2019   Cardiac tamponade    Pericarditis 03/06/2019   Fever    Septic shock    Atypical chest pain    Atrial fibrillation with rapid ventricular response    Nausea and vomiting 01/17/2019   Acute diverticulitis 01/13/2019   Chronic back pain 01/13/2019   Tachycardia 01/13/2019   Hypokalemia 01/13/2019   Chronic low back pain 07/10/2018   Influenza A 05/20/2017   Severe sepsis 05/20/2017   Acute respiratory failure with hypoxia 05/20/2017   CKD (chronic kidney disease), stage III (HCC) 05/20/2017   PAF (paroxysmal atrial fibrillation) 05/20/2017   HTN (hypertension) 05/20/2017   Giant cell arteritis 05/20/2017   Coronary disease 05/20/2017   Abnormal urinalysis 05/20/2017   Osteoporosis 05/08/2016   Herniated nucleus pulposus, L5-S1, right 11/04/2015   Major depressive disorder, recurrent episode, moderate 07/16/2013   Abdominal pain, epigastric 06/22/2013   Lumbar stenosis 04/25/2013   Chronic insomnia 02/07/2013   Elevated transaminase level 02/07/2013   CVA (cerebral infarction) 10/29/2012   Visual changes 10/28/2012   Depression 10/28/2012   Anxiety state 10/28/2012   Anemia 10/28/2012   Nonspecific (abnormal) findings on radiological and other examination of gastrointestinal tract 01/25/2012   Hyponatremia 01/24/2012   Hematuria 01/24/2012  Enteritis 12/24/2011   Abdominal pain, other specified site 12/24/2011   Leg pain, right 02/18/2011   Rheumatoid arthritis 02/04/2011   Temporal arteritis (HCC) 09/01/2010   Hypertension 09/01/2010   Hyperlipidemia 09/01/2010   History of atrial fibrillation 09/01/2010    CAD (coronary artery disease) 09/01/2010   GERD (gastroesophageal reflux disease) 09/01/2010   Insomnia 09/01/2010    Orientation RESPIRATION BLADDER Height & Weight     Self, Place  O2 (Nasal Cannula 1 liter) Incontinent Weight: 173 lb 11.6 oz (78.8 kg) Height:  5\' 4"  (162.6 cm)  BEHAVIORAL SYMPTOMS/MOOD NEUROLOGICAL BOWEL NUTRITION STATUS      Incontinent Diet (Please see discharge summary)  AMBULATORY STATUS COMMUNICATION OF NEEDS Skin   Extensive Assist Verbally Other (Comment) (Dry,Ecchymosis,Arm,Leg,Bil.,Erythema,buttocks,Bil.,Wound/Incision LDAs,PI sacrum mid stage 2,foam lift dressing,PRN)                       Personal Care Assistance Level of Assistance  Bathing, Feeding, Dressing Bathing Assistance: Maximum assistance Feeding assistance: Maximum assistance Dressing Assistance: Maximum assistance     Functional Limitations Info  Sight, Hearing, Speech   Hearing Info: Impaired Speech Info: Adequate    SPECIAL CARE FACTORS FREQUENCY  PT (By licensed PT), OT (By licensed OT)     PT Frequency: 5x min weekly OT Frequency: 5x min weekly            Contractures Contractures Info: Not present    Additional Factors Info  Code Status, Allergies, Psychotropic Code Status Info: DNR Allergies Info: Alendronate Sodium,Dextromethorphan Psychotropic Info: escitalopram (LEXAPRO) tablet 10 mg daily,QUEtiapine (SEROQUEL) tablet 25 mg daily at bedtime         Current Medications (07/13/2022):  This is the current hospital active medication list Current Facility-Administered Medications  Medication Dose Route Frequency Provider Last Rate Last Admin   0.9 %  sodium chloride infusion   Intravenous PRN Latrelle Dodrill, MD   Stopped at 07/11/22 1625   amiodarone (PACERONE) tablet 200 mg  200 mg Oral Daily Darnelle Spangle B, MD   200 mg at 07/13/22 0815   apixaban (ELIQUIS) tablet 5 mg  5 mg Oral BID Alicia Amel, MD   5 mg at 07/13/22 0815   atorvastatin  (LIPITOR) tablet 80 mg  80 mg Oral Daily Darnelle Spangle B, MD   80 mg at 07/13/22 0815   ceFEPIme (MAXIPIME) 2 g in sodium chloride 0.9 % 100 mL IVPB  2 g Intravenous Q12H Andreas Ohm, RPH 200 mL/hr at 07/13/22 0325 2 g at 07/13/22 0325   Chlorhexidine Gluconate Cloth 2 % PADS 6 each  6 each Topical Daily Latrelle Dodrill, MD   6 each at 07/13/22 0816   diltiazem (CARDIZEM CD) 24 hr capsule 240 mg  240 mg Oral QPM Alicia Amel, MD   240 mg at 07/12/22 1723   escitalopram (LEXAPRO) tablet 10 mg  10 mg Oral Daily Alicia Amel, MD   10 mg at 07/13/22 0816   feeding supplement (ENSURE ENLIVE / ENSURE PLUS) liquid 120 mL  120 mL Oral Daily Darnelle Spangle B, MD   120 mL at 07/13/22 0816   fentaNYL (DURAGESIC) 25 MCG/HR 1 patch  1 patch Transdermal Q72H Alicia Amel, MD   1 patch at 07/12/22 1215   HYDROcodone-acetaminophen (NORCO/VICODIN) 5-325 MG per tablet 1 tablet  1 tablet Oral QHS Alicia Amel, MD   1 tablet at 07/12/22 2128   HYDROcodone-acetaminophen (NORCO/VICODIN) 5-325 MG per tablet 1 tablet  1 tablet Oral Q12H PRN Alicia Amel, MD   1 tablet at 07/12/22 1723   metoprolol tartrate (LOPRESSOR) tablet 100 mg  100 mg Oral BID Darnelle Spangle B, MD   100 mg at 07/13/22 0815   ondansetron (ZOFRAN) injection 4 mg  4 mg Intravenous Q8H PRN Littie Deeds, MD   4 mg at 07/12/22 1655   Oral care mouth rinse  15 mL Mouth Rinse PRN Latrelle Dodrill, MD       Oral care mouth rinse  15 mL Mouth Rinse PRN Latrelle Dodrill, MD       polyethylene glycol (MIRALAX / GLYCOLAX) packet 17 g  17 g Oral Daily PRN Dameron, Nolberto Hanlon, DO       predniSONE (DELTASONE) tablet 10 mg  10 mg Oral Q breakfast Alicia Amel, MD   10 mg at 07/13/22 0601   QUEtiapine (SEROQUEL) tablet 25 mg  25 mg Oral QHS Alicia Amel, MD   25 mg at 07/12/22 2127     Discharge Medications: Please see discharge summary for a list of discharge medications.  Relevant Imaging Results:  Relevant Lab  Results:   Additional Information [id number]  Delilah Shan, LCSWA

## 2022-07-13 NOTE — Progress Notes (Addendum)
Paged by RN for bradycardia to 40s-50s.  Per MAR, she did receive metoprolol tartrate 100 mg this evening as scheduled. Has also received Seroquel, Norco. Has also been receiving Amiodarone (known to cause heart block  Went to the bedside.  She is lying comfortably in bed, and she is awake and easily arousable.  Although, she still sluggish with garbled words.  She is able to answer one-word sentences. When the RN asked her how she is feeling, she says "poor" but is unable to further clarify.   While at the bedside, patient's heart rate ranged from 30-50 bpm.  Plan: -Obtained stat EKG at the bedside showing bradyarrhythmia with no discernable P waves. Does appear to have premature atrial complexes. No apparent heart block or STEMI.  -Curbsided with cardiology who reviewed EKG- rhythm most consistent with atrial fibrillation. Agree with notion that AV nodal blocking agents as culprit of medication induced bradycardia.  -Gave 250 cc bolus and gentle IVF hydration.  Most recent MAP 65, however pressures are starting to trend downward. -Discontinued Amiodarone, Diltiazem and Metoprolol (all received today per [facility]). She is anticoagulated with Eliquis.  - STAT CBC, CMP, Mg collected  I will call patient's daughter to update her on clinical status.  Darral Dash, DO

## 2022-07-13 NOTE — Progress Notes (Signed)
     Daily Progress Note Intern Pager: [phone]  Patient name: Betty Jackson Medical record number: [id number] Date of birth: [birth year] Age: 88 y.o. Gender: female  Primary Care Provider: Keane Police, MD Consultants: None Code Status: DNR  Pt Overview and Major Events to Date:  4/12 - Admitted 4/12- 14: Vancomycin  4/12- 4/15: Cefepime  4/15: Started cefdinir  Assessment and Plan: Betty Jackson is an 88 yo female with a PMH of PAF on eliquis, chronic pain on opioids, MDD, HFpEF, GCA on steroids, who presented with AMS c/f metabolic encephalopathy in the setting of likely pneumonia. Now improving   * Altered mental status, unspecified Improved mental status. Able to have conversations but falls asleep easily. Most likely due to PNA complicated by opioid use for chronic pain.  -- Patient's family elects to continue opioids palliatively for comfort. Patient continues to improve while on antibiotics.  - Delirium precautions  - Fall precautions  -- Follows palliative care outpatient  -- Restarting home prednisone to avoid potential adrenal insufficiency contribution  Pneumonia Likely cause of #1. Now improved and on 1L.  - De-escalate antibiotics to cefdinir  - Incentive spirometer - Flutter valve - Continue to monitor respiratory status.   Paroxysmal atrial fibrillation - Continue home Eliquis, Amiodarone, Diltiazem, and metoprolol  Anemia Hgb 10.5 today. Stable. B12 elevated and folate wnl. Iron slightly low but TIBC decreased. Most likely anemia of chronic disease complicated by some iron deficiency given decreased po with vocal cord dysfunction and acutely ill.  - Continue to monitor  - AM CBC   FEN/GI: Regular  PPx: Home eliquis  Dispo:Pending clinical status.   Subjective:  Patient reports that she feels better. Daughter states she is worried that patient has not been eating as much. Patient denies nausea or dysphagia and says she does not feel like eating.    Objective: Temp:  [98.5 F (36.9 C)-98.6 F (37 C)] 98.5 F (36.9 C) (04/15 0814) Pulse Rate:  [60-97] 62 (04/15 1200) Resp:  [14-21] 17 (04/15 1200) BP: (98-135)/(50-76) 117/76 (04/15 1200) SpO2:  [94 %-98 %] 94 % (04/15 1200) Physical Exam: General: Chronically ill appearing  Cardiovascular: RRR, cap refill = 2  Respiratory: Bradypnea, shallow breaths, no increased work of breathing, no crackles auscultated however difficult to auscultate entire lung fields due to positioning  Abdomen: Soft, nontender, non distended Neuro: 2 mm pupils, PERRLA, conversational, though doses off during conversation   Laboratory: Most recent CBC Lab Results  Component Value Date   WBC 7.6 07/13/2022   HGB 10.5 (L) 07/13/2022   HCT 34.3 (L) 07/13/2022   MCV 98.8 07/13/2022   PLT 155 07/13/2022   Most recent BMP    Latest Ref Rng & Units 07/13/2022    3:46 AM  BMP  Glucose 70 - 99 mg/dL 268   BUN 8 - 23 mg/dL 21   Creatinine 3.41 - 1.00 mg/dL 9.62   Sodium 229 - 798 mmol/L 141   Potassium 3.5 - 5.1 mmol/L 4.4   Chloride 98 - 111 mmol/L 112   CO2 22 - 32 mmol/L 23   Calcium 8.9 - 10.3 mg/dL 8.6     Other pertinent labs: Iron 21, TIBC 160, Folate 14.1, B12 1142   Lockie Mola, MD 07/13/2022, 4:45 PM  PGY-1, [facility] FPTS Intern pager: [phone], text pages welcome Secure chat group [facility] Teaching Service

## 2022-07-13 NOTE — TOC Initial Note (Addendum)
Transition of Care [facility]) - Initial/Assessment Note    Patient Details  Name: Betty Jackson MRN: [id number] Date of Birth: 1934/11/29  Transition of Care [facility]) CM/SW Contact:    Delilah Shan, LCSWA Phone Number: 07/13/2022, 11:26 AM  Clinical Narrative:                  CSW received consult for possible SNF placement at time of discharge. Due to patients current orientation CSW spoke with patients daughter Efraim Kaufmann regarding PT recommendation of SNF placement for patient at time of discharge. Patients daughter reports PTA patient comes from [facility] long term.Patients daughter expressed understanding of PT recommendation for patient and is agreeable for patient to receive short term rehab when she returns to [facility].  CSW discussed insurance authorization process with patients daughter. No further questions reported at this time. CSW LVM for [place] with [facility].CSW started insurance authorization for patient. Auth ID# [id number].CSW to continue to follow and assist with discharge planning needs.   Expected Discharge Plan: Skilled Nursing Facility Barriers to Discharge: Continued Medical Work up   Patient Goals and CMS Choice     Choice offered to / list presented to : Adult Children (Patients daughter Efraim Kaufmann)      Expected Discharge Plan and Services In-house Referral: Clinical Social Work     Living arrangements for the past 2 months: Skilled Nursing Facility                                      Prior Living Arrangements/Services Living arrangements for the past 2 months: Skilled Nursing Facility Lives with:: Facility Resident Patient language and need for interpreter reviewed:: Yes Do you feel safe going back to the place where you live?: Yes      Need for Family Participation in Patient Care: Yes (Comment) Care giver support system in place?: Yes (comment)   Criminal Activity/Legal Involvement Pertinent to Current  Situation/Hospitalization: No - Comment as needed  Activities of Daily Living Home Assistive Devices/Equipment: Wheelchair, Dentures (specify type), Eyeglasses ADL Screening (condition at time of admission) Patient's cognitive ability adequate to safely complete daily activities?: No Is the patient deaf or have difficulty hearing?: Yes Does the patient have difficulty seeing, even when wearing glasses/contacts?: No Does the patient have difficulty concentrating, remembering, or making decisions?: No Patient able to express need for assistance with ADLs?: Yes Does the patient have difficulty dressing or bathing?: Yes Independently performs ADLs?: No Communication: Independent Dressing (OT): Needs assistance Is this a change from baseline?: Pre-admission baseline Grooming: Needs assistance Is this a change from baseline?: Pre-admission baseline Feeding: Needs assistance Is this a change from baseline?: Pre-admission baseline Bathing: Needs assistance Is this a change from baseline?: Pre-admission baseline Toileting: Needs assistance Is this a change from baseline?: Pre-admission baseline In/Out Bed: Needs assistance Is this a change from baseline?: Pre-admission baseline Walks in Home: Needs assistance Is this a change from baseline?: Pre-admission baseline Does the patient have difficulty walking or climbing stairs?: Yes Weakness of Legs: Both Weakness of Arms/Hands: Both  Permission Sought/Granted Permission sought to share information with : Case Manager, Family Supports, Oceanographer granted to share information with : No  Share Information with NAME: Due to patients current orientation CSW spoke with patients daughter Efraim Kaufmann  Permission granted to share info w AGENCY: Due to patients current orientation CSW spoke with patients daughter Melissa/SNF  Permission granted  to share info w Relationship: Due to patients current orientation CSW spoke with  patients daughter Efraim Kaufmann  Permission granted to share info w Contact Information: Due to patients current orientation CSW spoke with patients daughter Efraim Kaufmann [phone]  Emotional Assessment       Orientation: : Oriented to Self, Oriented to Place Alcohol / Substance Use: Not Applicable Psych Involvement: No (comment)  Admission diagnosis:  Altered mental status, unspecified altered mental status type [R41.82] Altered mental status, unspecified [R41.82] Patient Active Problem List   Diagnosis Date Noted   Paroxysmal atrial fibrillation 07/12/2022   Altered mental status, unspecified 07/10/2022   Hyperbilirubinemia 07/10/2022   Gastritis and gastroduodenitis 05/23/2022   Pneumonia 05/20/2022   Urinary tract infection without hematuria    Bleeding hemorrhoids    Rectal ulcer    Hematochezia    Odynophagia    Esophagitis    Lung nodules 09/04/2021   Sepsis secondary to UTI 09/03/2021   Atrial fibrillation with RVR 08/06/2021   Dysphagia 08/05/2021   Acute metabolic encephalopathy 03/17/2021   Hypoxemia 03/15/2021   Acute kidney injury superimposed on chronic kidney disease 03/15/2021   Stroke-like symptoms 07/14/2020   Cough 05/24/2020   Dyspnea 05/24/2020   Optic neuritis 05/24/2020   Osteopenia 05/24/2020   Rash 05/24/2020   Vitamin D deficiency 05/24/2020   Palliative care by specialist    Goals of care, counseling/discussion    DNR (do not resuscitate)    Weakness    Advanced directives, counseling/discussion    Pressure injury of skin 10/18/2019   Closed fracture of right distal femur 10/17/2019   Femur fracture, right 09/25/2019   Chronic diastolic CHF (congestive heart failure) 09/25/2019   HCAP (healthcare-associated pneumonia) 06/19/2019   Encephalopathy 06/19/2019   Lumbar degenerative disc disease    Paroxysmal A-fib    Hypomagnesemia    C. difficile colitis    Diarrhea 05/29/2019   CHF (congestive heart failure) 05/29/2019   Recurrent falls  04/30/2019   Pericardial effusion without cardiac tamponade 03/29/2019   Hypoalbuminemia 03/27/2019   Cardiac tamponade    Pericarditis 03/06/2019   Fever    Septic shock    Atypical chest pain    Atrial fibrillation with rapid ventricular response    Nausea and vomiting 01/17/2019   Acute diverticulitis 01/13/2019   Chronic back pain 01/13/2019   Tachycardia 01/13/2019   Hypokalemia 01/13/2019   Chronic low back pain 07/10/2018   Influenza A 05/20/2017   Severe sepsis 05/20/2017   Acute respiratory failure with hypoxia 05/20/2017   CKD (chronic kidney disease), stage III (HCC) 05/20/2017   PAF (paroxysmal atrial fibrillation) 05/20/2017   HTN (hypertension) 05/20/2017   Giant cell arteritis 05/20/2017   Coronary disease 05/20/2017   Abnormal urinalysis 05/20/2017   Osteoporosis 05/08/2016   Herniated nucleus pulposus, L5-S1, right 11/04/2015   Major depressive disorder, recurrent episode, moderate 07/16/2013   Abdominal pain, epigastric 06/22/2013   Lumbar stenosis 04/25/2013   Chronic insomnia 02/07/2013   Elevated transaminase level 02/07/2013   CVA (cerebral infarction) 10/29/2012   Visual changes 10/28/2012   Depression 10/28/2012   Anxiety state 10/28/2012   Anemia 10/28/2012   Nonspecific (abnormal) findings on radiological and other examination of gastrointestinal tract 01/25/2012   Hyponatremia 01/24/2012   Hematuria 01/24/2012   Enteritis 12/24/2011   Abdominal pain, other specified site 12/24/2011   Leg pain, right 02/18/2011   Rheumatoid arthritis 02/04/2011   Temporal arteritis (HCC) 09/01/2010   Hypertension 09/01/2010   Hyperlipidemia 09/01/2010   History of atrial  fibrillation 09/01/2010   CAD (coronary artery disease) 09/01/2010   GERD (gastroesophageal reflux disease) 09/01/2010   Insomnia 09/01/2010   PCP:  Keane Police, MD Pharmacy:  No Pharmacies Listed    Social Determinants of Health (SDOH) Social History: SDOH Screenings    Food Insecurity: Patient Unable To Answer (07/10/2022)  Housing: Low Risk  (07/10/2022)  Transportation Needs: Patient Unable To Answer (07/10/2022)  Utilities: Patient Unable To Answer (07/10/2022)  Alcohol Screen: Low Risk  (07/16/2020)  Depression (PHQ2-9): Low Risk  (07/16/2020)  Financial Resource Strain: Low Risk  (07/16/2020)  Physical Activity: Inactive (07/16/2020)  Social Connections: Unknown (07/16/2020)  Tobacco Use: Low Risk  (05/25/2022)   SDOH Interventions:     Readmission Risk Interventions    10/30/2019    9:48 AM  Readmission Risk Prevention Plan  Transportation Screening Complete  Medication Review (RN Care Manager) Complete  PCP or Specialist appointment within 3-5 days of discharge Complete  HRI or Home Care Consult Complete  SW Recovery Care/Counseling Consult Complete  Palliative Care Screening Complete  Skilled Nursing Facility Not Applicable

## 2022-07-13 NOTE — Progress Notes (Signed)
Went back to check on patient.  Heart rate seems to be more sustained and the 50s s/p small fluid bolus. She also seems a bit more awake.  Able to answer questions.  Says "I appreciate you." Let her know that we stopped some of her medicines for now and that we updated her daughter Efraim Kaufmann.  If any change in clinical status, or patient is restless, we will call daughter so she can come be with her overnight.  Darral Dash, DO

## 2022-07-14 DIAGNOSIS — Z7189 Other specified counseling: Secondary | ICD-10-CM

## 2022-07-14 DIAGNOSIS — Z515 Encounter for palliative care: Secondary | ICD-10-CM

## 2022-07-14 DIAGNOSIS — R627 Adult failure to thrive: Secondary | ICD-10-CM

## 2022-07-14 DIAGNOSIS — J189 Pneumonia, unspecified organism: Secondary | ICD-10-CM | POA: Diagnosis not present

## 2022-07-14 LAB — CULTURE, BLOOD (ROUTINE X 2)
Culture: NO GROWTH
Special Requests: ADEQUATE

## 2022-07-14 LAB — TROPONIN I (HIGH SENSITIVITY): Troponin I (High Sensitivity): 12 ng/L

## 2022-07-14 LAB — HAPTOGLOBIN: Haptoglobin: 199 mg/dL (ref 41–333)

## 2022-07-14 NOTE — Progress Notes (Signed)
     Daily Progress Note Intern Pager: [phone]  Patient name: Betty Jackson Medical record number: [id number] Date of birth: [birth year] Age: 88 y.o. Gender: female  Primary Care Provider: Keane Police, MD Consultants: Palliative Medicine Code Status: DNR  Pt Overview and Major Events to Date:  4/12 - Admitted 4/12- 14: Vancomycin  4/12- 4/15: Cefepime  4/15: Started cefdinir, overnight bradycardia, discontinued metoprolol and amiodarone   Assessment and Plan: Betty Jackson is an 88 yo female with a PMH of PAF on eliquis, chronic pain on opioids, MDD, HFpEF, GCA on steroids, who presented with AMS c/f metabolic encephalopathy in the setting of likely pneumonia. Now with improved mental status, but overall with poor long-term prognosis.   * Altered mental status, unspecified Much improved. Having full conversations today.  -- Patient's family elects to continue opioids palliatively for comfort. Patient continues to improve while on antibiotics.  - Delirium precautions  - Fall precautions  -- Consult palliative medicine team as patient is improved, but has many long term conditions and has poor long term prognosis.    Pneumonia Now on room air.  - Continue cefdinir  - Incentive spirometer - Flutter valve - Continue to monitor respiratory status.   Paroxysmal atrial fibrillation Had episode of bradycardia yesterday while in afib. Cardiology was contacted and recommended discontinuing some of the AV nodal blocking agents. Most likely as patient is weaker now and just gotten over illness she cannot tolerate all of the agents she was on prior.  - Discontinue metoprolol and amiodarone  - Continue home Eliquis, Diltiazem - Continue cardiac telemetry   Anemia Hgb 9.0 today. Patient received significant fluids overnight. Most likely stable from yesterday, but dilutional value.  - Continue to monitor  - AM CBC   FEN/GI: Regular  PPx: Eliquis  Dispo:SNF pending insurance  approval   Subjective:  Patient says she generally feels poorly. Denies nausea or chest pain.   Objective: Temp:  [97.1 F (36.2 C)-98.6 F (37 C)] 98 F (36.7 C) (04/16 1225) Pulse Rate:  [52-70] 68 (04/16 1225) Resp:  [14-18] 14 (04/16 1225) BP: (118-143)/(51-95) 135/64 (04/16 1225) SpO2:  [92 %-97 %] 92 % (04/16 1225) Physical Exam: General: Chronically ill appear, much more awake and talkative today  Cardiovascular: RRR, cap refill < 2 seconds Respiratory: Normal work of breathing on room air  Abdomen: Soft, non tender, non distended   Laboratory: Most recent CBC Lab Results  Component Value Date   WBC 7.8 07/13/2022   HGB 9.0 (L) 07/13/2022   HCT 27.7 (L) 07/13/2022   MCV 96.9 07/13/2022   PLT 161 07/13/2022   Most recent BMP    Latest Ref Rng & Units 07/13/2022   10:51 PM  BMP  Glucose 70 - 99 mg/dL 147   BUN 8 - 23 mg/dL 23   Creatinine 8.29 - 1.00 mg/dL 5.62   Sodium 130 - 865 mmol/L 141   Potassium 3.5 - 5.1 mmol/L 4.2   Chloride 98 - 111 mmol/L 109   CO2 22 - 32 mmol/L 23   Calcium 8.9 - 10.3 mg/dL 8.5     Lockie Mola, MD 07/14/2022, 12:53 PM  PGY-1, [facility] FPTS Intern pager: [phone], text pages welcome Secure chat group [facility] Teaching Service

## 2022-07-14 NOTE — Progress Notes (Signed)
FMTS Brief Progress Note  S:Patient asleep upon arrival for night rounds. Did not interrupt her sleep.   O: BP (!) 155/95   Pulse 63   Temp 98 F (36.7 C) (Oral)   Resp 14   Ht 5\' 4"  (1.626 m)   Wt 78.8 kg   SpO2 95%   BMI 29.82 kg/m   General: Asleep,  NAD CV: well perfused, Rate controlled by room monitor   Pulm: Good WOB on RA   A/P: Altered mental status Asleep and appears to have her home nightly Seroquel. -Delirium and fall precaution in place -Plan per day team.  Paroxysmal A-fib Rate controlled.  Amiodarone and metoprolol held due to bradycardia.  Pulse currently stable. -Continue to hold amiodarone and Metroprolol -On Eliquis and diltiazem.  Goals of care Palliative consulted.   -Plan for meeting with palliative team tomorrow 4/17.  Jerre Simon, MD 07/14/2022, 8:07 PM PGY-2,  Family Medicine Night Resident  Please page [phone] with questions.

## 2022-07-14 NOTE — Hospital Course (Signed)
Betty Jackson is a 88 y.o.female with a history of A-fib on Eliquis, HFpEF, history of CVA, depression, chronic pain syndrome, vocal cord dysfunction who was admitted to the FPTS teaching Service at [facility] for altered mental status. Her hospital course is detailed below:  Altered Mental Status I Pneumonia  Patient was admitted with somnolence.  She came from where she has only been confused for 1 day.  She did recently have over 5 admissions in the last 3 years for similar presentation of encephalopathy most recently but a month prior.  Patient was tachypneic and tachycardic as well as hypoxic requiring 3 L on presentation.  She was started on vancomycin and cefepime.  Head CT did not show any acute intracranial abnormalities.  Chest x-ray significant for cardiomegaly and increased interstitial markings in the perihilar regions and lower lung field suggesting interstitial pneumonia versus interstitial pulmonary edema.  Patient was also given IV fluids.  She continued to improve with antibiotics and IV fluids and had improved mentation over the next few days.  She was transition to p.o. cefdinir on 4/15 to complete a 7 day course and on room air by 4/16.  Chronic Pain Syndrome  Patient is on fentanyl patch, Norco 7.5-325 twice daily at home.  On presentation patient was very somnolent and fentanyl patch was removed on 4/14.  Patient started to have more pain and symptoms of withdrawal thus after discussion with family to discuss risks and benefits of opioids as they can cause decreased alertness and have caused her encephalopathy in the past family would like patient to continue to be on the opioids to manage her pain and keep her comfortable understanding the risks.  Patient was restarted on home medications and was comfortable before discharge.  Afib  Patient was continued on her home medications of Eliquis, metoprolol, amiodarone, and diltiazem.  However, on 4/15 patient had bradycardic episode to heart rate  of 30 that resolved with fluids.  Team discussed with cardiology and discussed needing to discontinue some of the AV nodal blocking agents as patient is on 3.  Metoprolol and amiodarone were discontinued.  Patient's heart rate remained stable thereafter.  End of Life Care  Patient has been admitted to the hospital multiple times for encephalopathic episodes. She has improved, but appetite remains low, and patient is overall frail. After discussing with team and palliative medicine, patient and family decided to go back to facility with hospice.   Other chronic conditions were medically managed with home medications and formulary alternatives as necessary (atrial fibrillation, HFpEF, HLD, MDD, gout, giant cell arthritis)  PCP Follow-up Recommendations: Continue to follow up with outpatient palliative care.  Discontinued amiodarone and metoprolol due to bradycardia.  Patient is hospice and does not want to be readmitted to the hospital.

## 2022-07-14 NOTE — Consult Note (Signed)
Consultation Note Date: 07/14/2022   Patient Name: Betty Jackson  DOB: [birth year]  MRN: [id number]  Age / Sex: 88 y.o., female  PCP: Keane Police, MD Referring Physician: Latrelle Dodrill, MD  Reason for Consultation: Establishing goals of care and Hospice Evaluation  HPI/Patient Profile: 88 y.o. female  with past medical history of stroke, anemia, CAD, HFpEF, HTN, HLD, PAF on Eliquis, rheumatoid arthritis, temporal arteritis, diverticulosis, GERD, chronic back pain, osteoporosis, anxiety, depression admitted on 07/10/2022 with altered mental status likely due to pneumonia.   Clinical Assessment and Goals of Care: Consult received and extensive chart review completed. I met today with Tiann and her daughter, Efraim Kaufmann, at bedside. Gladis is eating lunch and has recently had pain medication. They request to meet later today after she is able to sleep off her pain medication. Melissa called her sister, Shawna Orleans, and we agree to meet today 4pm.   I returned to bedside at 4pm as scheduled. Present was also a family friend who is a physician with family medicine, Dr. Burley Saver. I spoke with Melissa who shares that she and her sister are in favor of having hospice support at facility if her mother is on board. We spoke together with Darene about her declining health and aspiration risk. We discussed her chronic pain and how this impacts her overall status and functional limitations. We discussed priority of pain control and comfort. We reviewed in depth hospice services and how this could be beneficial for her. We discussed hospice and hopes they can help her have improved quality of life and help provide guidance to treat her at her home instead of returning to the hospital. After some discussion Shalicia agrees that she does not wish to return to the hospital and she feels that our discussion of hospice services  are aligned with her wishes. She agrees to having hospice support at her facility. We did discuss rehab vs hospice and they agree that hospice will be more beneficial at this time.   All questions/concerns addressed. Emotional support.   Primary Decision Maker HCPOA daughters Shawna Orleans and Melissa    SUMMARY OF RECOMMENDATIONS   - DNR - Return to facility with hospice support from [facility]  Code Status/Advance Care Planning: DNR   Symptom Management:  Continue pain meds:   fentanyl patch, 25 mcg/hour Norco 5-325mg  nightly  Norco 5-325mg  BID PRN Seroquel 25mg  nightly   Prognosis:  < 6 months  Discharge Planning: Return to facility with hospice.       Primary Diagnoses: Present on Admission:  Altered mental status, unspecified  (Resolved) Hyperbilirubinemia  Anemia  Pneumonia  Paroxysmal atrial fibrillation   I have reviewed the medical record, interviewed the patient and family, and examined the patient. The following aspects are pertinent.  Past Medical History:  Diagnosis Date   Acute respiratory failure with hypoxia (HCC) 05/20/2017   Anemia, unspecified 10/28/2012   Anxiety state, unspecified 10/28/2012   CAD (coronary artery disease)    a. Stent RCA in [facility] 2006;  b. Cath approx 2009 - nonobs per pt report.   Cataract    immature on the left eye   Chronic insomnia 02/07/2013   Chronic lower back pain    scoliosis   CKD (chronic kidney disease) stage 3, GFR 30-59 ml/min (HCC)    CVA (cerebral infarction) 10/29/2012   DDD (degenerative disc disease)    Diverticulosis    GERD (gastroesophageal reflux disease) 09/01/2010   Hemorrhoids    Herniated nucleus pulposus, L5-S1, right 11/04/2015   Hyperlipidemia    takes Lipitor daily   Hypertension    takes Amlodipine,Losartan,Metoprolol,and HCTZ daily   Incontinence of urine    Insomnia    takes Restoril nightly   Lumbar stenosis 04/25/2013   Major depressive disorder, recurrent  episode, moderate (HCC) 07/16/2013   Osteoporosis    PAF (paroxysmal atrial fibrillation) (HCC) 2011   a. lone epidode in 2011 according to notes.   Rheumatoid arthritis (HCC)    Scoliosis    Sepsis (HCC) 05/2019   Stroke (HCC)    Temporal arteritis (HCC) 2011   a. followed @ [facility]; potential flareup 10/28/2012/notes 10/28/2012   Vocal cord dysfunction    "they don't operate properly" (10/28/2012)   Social History   Socioeconomic History   Marital status: Married    Spouse name: Not on file   Number of children: 3   Years of education: Not on file   Highest education level: Not on file  Occupational History   Occupation: Retired Veterinary surgeon  Tobacco Use   Smoking status: Never   Smokeless tobacco: Never  Vaping Use   Vaping Use: Never used  Substance and Sexual Activity   Alcohol use: Yes    Comment: socially   Drug use: No   Sexual activity: Not on file  Other Topics Concern   Not on file  Social History Narrative   Lives at [facility], [employer]   Right Handed   Drinks 1-2 cups caffeine daily   Social Determinants of Health   Financial Resource Strain: Low Risk  (07/16/2020)   Overall Financial Resource Strain (CARDIA)    Difficulty of Paying Living Expenses: Not hard at all  Food Insecurity: Patient Unable To Answer (07/10/2022)   Hunger Vital Sign    Worried About Running Out of Food in the Last Year: Patient unable to answer    Ran Out of Food in the Last Year: Patient unable to answer  Transportation Needs: Patient Unable To Answer (07/10/2022)   PRAPARE - Transportation    Lack of Transportation (Medical): Patient unable to answer    Lack of Transportation (Non-Medical): Patient unable to answer  Physical Activity: Inactive (07/16/2020)   Exercise Vital Sign    Days of Exercise per Week: 0 days    Minutes of Exercise per Session: 0 min  Stress: Not on file  Social Connections: Unknown (07/16/2020)   Social Connection and Isolation Panel [NHANES]     Frequency of Communication with Friends and Family: More than three times a week    Frequency of Social Gatherings with Friends and Family: More than three times a week    Attends Religious Services: Never    Database administrator or Organizations: No    Attends Banker Meetings: Never    Marital Status: Patient declined   Family History  Problem Relation Age of Onset   Brain cancer Mother 31   Heart attack Father    Breast cancer Daughter 54   Heart disease Other  Hypertension Other    Arthritis Neg Hx    Colon cancer Neg Hx    Osteoporosis Neg Hx    Scheduled Meds:  apixaban  5 mg Oral BID   atorvastatin  80 mg Oral Daily   cefdinir  300 mg Oral Q12H   Chlorhexidine Gluconate Cloth  6 each Topical Daily   diltiazem  240 mg Oral QPM   escitalopram  10 mg Oral Daily   feeding supplement  120 mL Oral Daily   fentaNYL  1 patch Transdermal Q72H   HYDROcodone-acetaminophen  1 tablet Oral QHS   predniSONE  10 mg Oral Q breakfast   QUEtiapine  25 mg Oral QHS   Continuous Infusions:  sodium chloride Stopped (07/11/22 1625)   sodium chloride 50 mL/hr at 07/14/22 0315   PRN Meds:.sodium chloride, HYDROcodone-acetaminophen, ondansetron (ZOFRAN) IV, mouth rinse, mouth rinse, polyethylene glycol Allergies  Allergen Reactions   Alendronate Sodium     Suspected cause of severe esophagitis (per EGD 08/05/21)   Dextromethorphan Rash   Review of Systems  Constitutional:  Positive for activity change, appetite change and fatigue.       Pain  Neurological:  Positive for weakness.    Physical Exam Vitals and nursing note reviewed.  Constitutional:      General: She is not in acute distress.    Appearance: She is ill-appearing.  Cardiovascular:     Rate and Rhythm: Bradycardia present.  Pulmonary:     Effort: No tachypnea, accessory muscle usage or respiratory distress.  Abdominal:     Palpations: Abdomen is soft.  Neurological:     Mental Status: She is alert and  oriented to person, place, and time.     Vital Signs: BP 135/64 (BP Location: Left Arm)   Pulse 68   Temp 98 F (36.7 C) (Oral)   Resp 14   Ht  (1.626 m)   Wt 78.8 kg   SpO2 92%   BMI 29.82 kg/m  Pain Scale: 0-10   Pain Score: 8    SpO2: SpO2: 92 % O2 Device:SpO2: 92 % O2 Flow Rate: .O2 Flow Rate (L/min): 1 L/min  IO: Intake/output summary:  Intake/Output Summary (Last 24 hours) at 07/14/2022 1230 Last data filed at 07/13/2022 1700 Gross per 24 hour  Intake --  Output 200 ml  Net -200 ml    LBM: Last BM Date : 07/13/22 Baseline Weight: Weight: 84.9 kg Most recent weight: Weight: 78.8 kg     Palliative Assessment/Data:     Time In: 1230  Time Total: 80 min  Greater than 50%  of this time was spent counseling and coordinating care related to the above assessment and plan.  Signed by: Yong Channel, NP Palliative Medicine Team Pager # [phone] (M-F 8a-5p) Team Phone # [phone] (Nights/Weekends)

## 2022-07-14 NOTE — Care Management Important Message (Signed)
Important Message  Patient Details  Name: Betty Jackson MRN: [id number] Date of Birth: 04/19/1934   Medicare Important Message Given:  Yes     Renie Ora 07/14/2022, 1:07 PM

## 2022-07-14 NOTE — Discharge Instructions (Signed)
Dear Linard Millers,   Thank you so much for allowing [place] to be part of your care!  You were admitted to [facility] for altered mental status.  This is most likely due to delirium in the setting of having pneumonia.  This was treated with antibiotics.  You were also treated with fluids.  Please continue the cefdinir for 4 more days.   POST-HOSPITAL & CARE INSTRUCTIONS Continue cefdinir for four more days.  Please let PCP/Specialists know of any changes that were made.  Please see medications section of this packet for any medication changes.   DOCTOR'S APPOINTMENT & FOLLOW UP CARE INSTRUCTIONS  Future Appointments  Date Time Provider Department Center  08/17/2022  2:40 PM Kathleene Hazel, MD [facility]    RETURN PRECAUTIONS: If you start to have shortness of breath or confusion.  If anyone else notices that your confused or have altered mental status.  Take care and be well!  Family Medicine Teaching Service  [place]  [facility]  [address] [place], [place] [address] [phone]

## 2022-07-14 NOTE — TOC Progression Note (Addendum)
Transition of Care [facility]) - Progression Note    Patient Details  Name: Betty Jackson MRN: [id number] Date of Birth: Jul 28, 1934  [facility] CM/SW Contact  Delilah Shan, LCSWA Phone Number: 07/14/2022, 10:14 AM  Clinical Narrative:     CSW spoke with Cheree with [facility] who confirmed patient can return back to facility when ready. Patients insurance authorization is currently pending. CSW awaiting determination. CSW will continue to follow and assist with patients dc planning needs.  Patients insurance requested additional clinicals for insurance authorization determination. CSW submitted additional clinicals requested to Patients insurance for review. CSW awaiting determination.  Update-5:06pm- CSW received insurance authorization approval for patient. Plan Auth ID# [id number] Auth ID# [id number]. Insurance authorization approved from 4/16-4/18. CSW informed MD.  Expected Discharge Plan: Skilled Nursing Facility Barriers to Discharge: Continued Medical Work up  Expected Discharge Plan and Services In-house Referral: Clinical Social Work     Living arrangements for the past 2 months: Skilled Nursing Facility                                       Social Determinants of Health (SDOH) Interventions SDOH Screenings   Food Insecurity: Patient Unable To Answer (07/10/2022)  Housing: Low Risk  (07/10/2022)  Transportation Needs: Patient Unable To Answer (07/10/2022)  Utilities: Patient Unable To Answer (07/10/2022)  Alcohol Screen: Low Risk  (07/16/2020)  Depression (PHQ2-9): Low Risk  (07/16/2020)  Financial Resource Strain: Low Risk  (07/16/2020)  Physical Activity: Inactive (07/16/2020)  Social Connections: Unknown (07/16/2020)  Tobacco Use: Low Risk  (05/25/2022)    Readmission Risk Interventions    10/30/2019    9:48 AM  Readmission Risk Prevention Plan  Transportation Screening Complete  Medication Review (RN Care Manager) Complete  PCP or Specialist  appointment within 3-5 days of discharge Complete  HRI or Home Care Consult Complete  SW Recovery Care/Counseling Consult Complete  Palliative Care Screening Complete  Skilled Nursing Facility Not Applicable

## 2022-07-15 LAB — CULTURE, BLOOD (ROUTINE X 2): Culture: NO GROWTH

## 2022-07-15 MED ORDER — ONDANSETRON HCL 4 MG/2ML IJ SOLN: 4.0000 mg | Freq: Three times a day (TID) | INTRAMUSCULAR | 0 refills | Status: DC | PRN

## 2022-07-15 MED ORDER — HYDROCODONE-ACETAMINOPHEN 5-325 MG PO TABS: 1.0000 | ORAL_TABLET | Freq: Every day | ORAL | 0 refills | Status: AC

## 2022-07-15 MED ORDER — POLYETHYLENE GLYCOL 3350 17 G PO PACK: 17.0000 g | PACK | Freq: Every day | ORAL | 0 refills | Status: AC | PRN

## 2022-07-15 MED ORDER — CEFDINIR 300 MG PO CAPS
300.0000 mg | ORAL_CAPSULE | Freq: Two times a day (BID) | ORAL | Status: DC
  Administered 2022-07-15: 300 mg via ORAL
  Filled 2022-07-15 (×2): qty 1

## 2022-07-15 MED ORDER — HYDROCODONE-ACETAMINOPHEN 5-325 MG PO TABS: 1.0000 | ORAL_TABLET | Freq: Two times a day (BID) | ORAL | 0 refills | Status: AC | PRN

## 2022-07-15 MED ORDER — FENTANYL 25 MCG/HR TD PT72: 1.0000 | MEDICATED_PATCH | TRANSDERMAL | 0 refills | Status: AC

## 2022-07-15 MED ORDER — CEFDINIR 300 MG PO CAPS: 300.0000 mg | ORAL_CAPSULE | Freq: Two times a day (BID) | ORAL | 0 refills | Status: DC

## 2022-07-15 MED ORDER — CEFDINIR 300 MG PO CAPS: 300.0000 mg | ORAL_CAPSULE | Freq: Two times a day (BID) | ORAL | 0 refills | Status: AC

## 2022-07-15 NOTE — Discharge Summary (Signed)
Family Medicine Teaching [facility] Discharge Summary  Patient name: Betty Jackson Medical record number: [id number] Date of birth: 1934-12-01 Age: 88 y.o. Gender: female Date of Admission: 07/10/2022  Date of Discharge: 07/15/2022 Admitting Physician: Lockie Mola, MD  Primary Care Provider: Keane Police, MD Consultants: Palliative  Indication for Hospitalization: Altered mental status and pneumonia  Discharge Diagnoses/Problem List:  Principal Problem:   Altered mental status, unspecified Active Problems:   Pneumonia   Paroxysmal atrial fibrillation   Anemia    Disposition: Hospice  Discharge Condition: Stable  Discharge Exam:  Taken from daily progress note General: Much brighter and more awake, chronically ill  Cardiovascular: RRR, cap refill < 2 seconds Respiratory: No increased work of breathing on room air, speaking in full sentences Abdomen: Soft, non tender, non distended Extremities: No BLE edema  Neuro: Alert and oriented x4   Brief Hospital Course:  Betty Jackson is a 88 y.o.female with a history of A-fib on Eliquis, HFpEF, history of CVA, depression, chronic pain syndrome, vocal cord dysfunction who was admitted to the FPTS teaching Service at [facility] for altered mental status. Her hospital course is detailed below:  Altered Mental Status I Pneumonia  Patient was admitted with somnolence.  She came from where she has only been confused for 1 day.  She did recently have over 5 admissions in the last 3 years for similar presentation of encephalopathy most recently but a month prior.  Patient was tachypneic and tachycardic as well as hypoxic requiring 3 L on presentation.  She was started on vancomycin and cefepime.  Head CT did not show any acute intracranial abnormalities.  Chest x-ray significant for cardiomegaly and increased interstitial markings in the perihilar regions and lower lung field suggesting interstitial pneumonia versus interstitial  pulmonary edema.  Patient was also given IV fluids.  She continued to improve with antibiotics and IV fluids and had improved mentation over the next few days.  She was transition to p.o. cefdinir on 4/15 to complete a 7 day course and on room air by 4/16.  Chronic Pain Syndrome  Patient is on fentanyl patch, Norco 7.5-325 twice daily at home.  On presentation patient was very somnolent and fentanyl patch was removed on 4/14.  Patient started to have more pain and symptoms of withdrawal thus after discussion with family to discuss risks and benefits of opioids as they can cause decreased alertness and have caused her encephalopathy in the past family would like patient to continue to be on the opioids to manage her pain and keep her comfortable understanding the risks.  Patient was restarted on home medications and was comfortable before discharge.  Afib  Patient was continued on her home medications of Eliquis, metoprolol, amiodarone, and diltiazem.  However, on 4/15 patient had bradycardic episode to heart rate of 30 that resolved with fluids.  Team discussed with cardiology and discussed needing to discontinue some of the AV nodal blocking agents as patient is on 3.  Metoprolol and amiodarone were discontinued.  Patient's heart rate remained stable thereafter.  End of Life Care  Patient has been admitted to the hospital multiple times for encephalopathic episodes. She has improved, but appetite remains low, and patient is overall frail. After discussing with team and palliative medicine, patient and family decided to go back to facility with hospice.   Other chronic conditions were medically managed with home medications and formulary alternatives as necessary (atrial fibrillation, HFpEF, HLD, MDD, gout, giant cell arthritis)  PCP Follow-up Recommendations: Continue  to follow up with outpatient palliative care.  Discontinued amiodarone and metoprolol due to bradycardia.  Patient is hospice and  does not want to be readmitted to the hospital.   Significant Labs and Imaging:  Recent Labs  Lab 07/11/22 0705 07/13/22 0346 07/13/22 2251  WBC 11.0* 7.6 7.8  HGB 11.1* 10.5* 9.0*  HCT 35.8* 34.3* 27.7*  PLT 152 155 161   Recent Labs  Lab 07/10/22 1505 07/11/22 0150 07/13/22 0346 07/13/22 2251  NA 141 137 141 141  K 3.6 3.7 4.4 4.2  CL 107 109 112* 109  CO2 25 22 23 23   GLUCOSE 103* 94 110* 113*  BUN 12 12 21 23   CREATININE 0.90 0.96 1.15* 1.14*  CALCIUM 8.5* 8.0* 8.6* 8.5*  MG  --   --   --  1.7  ALKPHOS 75 72 80 61  AST 19 17 20 17   ALT 16 15 14 13   ALBUMIN 2.8* 2.4* 2.4* 2.2*     Discharge Medications:  Allergies as of 07/15/2022       Reactions   Alendronate Sodium    Suspected cause of severe esophagitis (per EGD 08/05/21)   Dextromethorphan Rash        Medication List     STOP taking these medications    amiodarone 200 MG tablet Commonly known as: PACERONE   atorvastatin 80 MG tablet Commonly known as: Lipitor   cetirizine 10 MG tablet Commonly known as: ZYRTEC   ferrous sulfate 325 (65 FE) MG tablet   metoprolol tartrate 100 MG tablet Commonly known as: LOPRESSOR   multivitamin with minerals Tabs tablet   Nutritional Supplement Liqd   pantoprazole 40 MG tablet Commonly known as: PROTONIX   potassium chloride 10 MEQ tablet Commonly known as: KLOR-CON M   senna-docusate 8.6-50 MG tablet Commonly known as: Senokot-S   vitamin D3 25 MCG tablet Commonly known as: CHOLECALCIFEROL       TAKE these medications    acetaminophen 500 MG tablet Commonly known as: TYLENOL Take 500 mg by mouth every 6 (six) hours as needed for mild pain.   allopurinol 100 MG tablet Commonly known as: ZYLOPRIM Take 100 mg by mouth daily.   cefdinir 300 MG capsule Commonly known as: OMNICEF Take 1 capsule (300 mg total) by mouth every 12 (twelve) hours for 7 doses.   diltiazem 240 MG 24 hr capsule Commonly known as: CARDIZEM CD Take 240 mg by  mouth every evening.   Eliquis 5 MG Tabs tablet Generic drug: apixaban TAKE 1 TABLET BY MOUTH TWICE A DAY What changed: how much to take   escitalopram 10 MG tablet Commonly known as: LEXAPRO TAKE 1 TABLET BY MOUTH EVERY DAY   fentaNYL 25 MCG/HR Commonly known as: DURAGESIC Place 1 patch onto the skin every 3 (three) days.   furosemide 40 MG tablet Commonly known as: LASIX Take 1 tablet (40 mg total) by mouth daily as needed (extra dose, for weight gain of 2 pounds overnight or 5 pound in 1 week).   Hemorrhoidal Max Str 1-0.25-14.4-15 % Crea Apply 1 Application topically 2 (two) times daily as needed (hemorrhoidal pain/discomfort.).   HYDROcodone-acetaminophen 5-325 MG tablet Commonly known as: Norco Take 1 tablet by mouth every 12 (twelve) hours as needed for severe pain.   HYDROcodone-acetaminophen 5-325 MG tablet Commonly known as: Norco Take 1 tablet by mouth at bedtime.   ipratropium-albuterol 0.5-2.5 (3) MG/3ML Soln Commonly known as: DUONEB Take 3 mLs by nebulization every 4 (four) hours as needed.  What changed: reasons to take this   nitroGLYCERIN 0.4 MG SL tablet Commonly known as: NITROSTAT Place 1 tablet (0.4 mg total) under the tongue every 5 (five) minutes as needed for chest pain.   ondansetron 4 MG tablet Commonly known as: ZOFRAN Take 4 mg by mouth every 8 (eight) hours as needed for nausea or vomiting. What changed: Another medication with the same name was added. Make sure you understand how and when to take each.   ondansetron 4 MG/2ML Soln injection Commonly known as: ZOFRAN Inject 2 mLs (4 mg total) into the vein every 8 (eight) hours as needed for nausea or vomiting. What changed: You were already taking a medication with the same name, and this prescription was added. Make sure you understand how and when to take each.   OXYGEN Inhale 2 L/min into the lungs as needed (maintain SATS above 90%).   polyethylene glycol 17 g packet Commonly known  as: MIRALAX / GLYCOLAX Take 17 g by mouth daily as needed for mild constipation. What changed:  when to take this reasons to take this   predniSONE 10 MG tablet Commonly known as: DELTASONE Take 1 tablet (10 mg total) by mouth daily with breakfast. Resume after 3 days of  daily as instructed.   QUEtiapine 25 MG tablet Commonly known as: SEROQUEL Take 1 tablet (25 mg total) by mouth at bedtime.   sulfaSALAzine 500 MG EC tablet Commonly known as: AZULFIDINE Take 500 mg by mouth daily.        Discharge Instructions: Please refer to Patient Instructions section of EMR for full details.  Patient was counseled important signs and symptoms that should prompt return to medical care, changes in medications, dietary instructions, activity restrictions, and follow up appointments.   Follow-Up Appointments:  Follow-up Information     Keane Police, MD Follow up.   Specialty: Family Medicine Contact information: [address] [place] [address] [phone]                 Erick Alley, DO 07/15/2022, 12:56 PM PGY-2, [facility]

## 2022-07-15 NOTE — TOC Progression Note (Signed)
Transition of Care [facility]) - Progression Note    Patient Details  Name: Betty Jackson MRN: [id number] Date of Birth: 04-Mar-1935  Transition [facility] CM/SW Contact  Delilah Shan, LCSWA Phone Number: 07/15/2022, 11:38 AM  Clinical Narrative:     CSW informed by palliative NP and MD that plan has now changed from patient going to SNF with short term rehab to patient return to LTC facility with hospice services. Palliative NP spoke with family and made referral to Cheri with [facility] for hospice services to follow patient at the facility. CSW spoke with Cheri with [facility] who confirmed patient can return back to facility LTC with hospice services. CSW informed MD. CSW will cancel patients insurance authorization for short term rehab. CSW will continue to follow and assist with patients dc planning needs.  Expected Discharge Plan: Skilled Nursing Facility Barriers to Discharge: Continued Medical Work up  Expected Discharge Plan and Services In-house Referral: Clinical Social Work     Living arrangements for the past 2 months: Skilled Nursing Facility                                       Social Determinants of Health (SDOH) Interventions SDOH Screenings   Food Insecurity: Patient Unable To Answer (07/10/2022)  Housing: Low Risk  (07/10/2022)  Transportation Needs: Patient Unable To Answer (07/10/2022)  Utilities: Patient Unable To Answer (07/10/2022)  Alcohol Screen: Low Risk  (07/16/2020)  Depression (PHQ2-9): Low Risk  (07/16/2020)  Financial Resource Strain: Low Risk  (07/16/2020)  Physical Activity: Inactive (07/16/2020)  Social Connections: Unknown (07/16/2020)  Tobacco Use: Low Risk  (05/25/2022)    Readmission Risk Interventions    10/30/2019    9:48 AM  Readmission Risk Prevention Plan  Transportation Screening Complete  Medication Review (RN Care Manager) Complete  PCP or Specialist appointment within 3-5 days of discharge Complete  HRI  or Home Care Consult Complete  SW Recovery Care/Counseling Consult Complete  Palliative Care Screening Complete  Skilled Nursing Facility Not Applicable

## 2022-07-15 NOTE — Progress Notes (Signed)
     Daily Progress Note Intern Pager: [phone]  Patient name: Betty Jackson Medical record number: [id number] Date of birth: 12/15/1934 Age: 88 y.o. Gender: female  Primary Care Provider: Keane Police, MD Consultants: Palliative Care  Code Status: DNR  Pt Overview and Major Events to Date:  4/12 - Admitted 4/12 - 14: Vancomycin  4/12 - 4/15: Cefepime  4/15 -  Started cefdinir, overnight bradycardia, discontinued metoprolol and amiodarone  4/16 - Patient decided wants to go hospice.   Assessment and Plan: Betty Jackson is an 88 yo female with a PMH of PAF on eliquis, chronic pain on opioids, MDD, HFpEF, GCA on steroids, who presented with AMS c/f metabolic encephalopathy in the setting of likely pneumonia. Now with improved mental status, but overall with poor long-term prognosis.   * Altered mental status, unspecified Much improved. Having full conversations today.  -- Patient's family elects to continue opioids palliatively for comfort. Patient continues to improve while on antibiotics.  - Delirium precautions  - Fall precautions  -- Palliative team consulted, plan to discharge with hospice     Pneumonia On room air.  - Cefdinir day 4/7   Paroxysmal atrial fibrillation HR stable.  - Continue home Eliquis, Diltiazem - Continue cardiac telemetry   Anemia Lab holiday today. Patient is not symptomatic.    FEN/GI: Regular  PPx: home eliquis Dispo:Hospice pending approval   Subjective:  Patient says she feels not well, but has no specific concerns.   Objective: Temp:  [97.9 F (36.6 C)-98.6 F (37 C)] 97.9 F (36.6 C) (04/17 0835) Pulse Rate:  [63-97] 97 (04/17 0835) Resp:  [13-19] 19 (04/17 0835) BP: (126-155)/(59-95) 146/92 (04/17 0835) SpO2:  [90 %-95 %] 95 % (04/17 0835) Physical Exam: General: Much brighter and more awake, chronically ill  Cardiovascular: RRR, cap refill < 2 seconds Respiratory: No increased work of breathing on room air, speaking  in full sentences Abdomen: Soft, non tender, non distended Extremities: No BLE edema  Neuro: Alert and oriented x4   Laboratory: None today   Lockie Mola, MD 07/15/2022, 11:42 AM  PGY-1, [facility] FPTS Intern pager: [phone], text pages welcome Secure chat group [facility] Teaching Service

## 2022-07-15 NOTE — TOC Transition Note (Signed)
Transition of Care [facility]) - CM/SW Discharge Note   Patient Details  Name: Betty Jackson MRN: [id number] Date of Birth: [birth year]  Transition of Care [facility]) CM/SW Contact:  Delilah Shan, LCSWA Phone Number: 07/15/2022, 1:50 PM   Clinical Narrative:     Patient will DC to: [facility] SNF   Anticipated DC date: 07/15/2022  Family notified: Melissa  Transport by: Sharin Mons   ?  Per MD patient ready for DC to [facility] SNF with Hospice services to follow . RN, patient, patient's family, Cherice with [facility] ,and facility notified of DC. Discharge Summary sent to facility. RN given number for report tele# [phone] RM# [id number]. DC packet on chart.DNR signed by MD attached to patients DC packet. Ambulance transport requested for patient.  CSW signing off.   Final next level of care: Skilled Nursing Facility Barriers to Discharge: No Barriers Identified   Patient Goals and CMS Choice   Choice offered to / list presented to : Adult Children (Patients daughter Efraim Kaufmann)  Discharge Placement                Patient chooses bed at: [facility] Patient to be transferred to facility by: [facility] Name of family member notified: Melissa Patient and family notified of of transfer: 07/15/22  Discharge Plan and Services Additional resources added to the After Visit Summary for   In-house Referral: Clinical Social Work                                   Social Determinants of Health (SDOH) Interventions SDOH Screenings   Food Insecurity: Patient Unable To Answer (07/10/2022)  Housing: Low Risk  (07/10/2022)  Transportation Needs: Patient Unable To Answer (07/10/2022)  Utilities: Patient Unable To Answer (07/10/2022)  Alcohol Screen: Low Risk  (07/16/2020)  Depression (PHQ2-9): Low Risk  (07/16/2020)  Financial Resource Strain: Low Risk  (07/16/2020)  Physical Activity: Inactive (07/16/2020)  Social Connections: Unknown (07/16/2020)  Tobacco Use: Low  Risk  (05/25/2022)     Readmission Risk Interventions    10/30/2019    9:48 AM  Readmission Risk Prevention Plan  Transportation Screening Complete  Medication Review (RN Care Manager) Complete  PCP or Specialist appointment within 3-5 days of discharge Complete  HRI or Home Care Consult Complete  SW Recovery Care/Counseling Consult Complete  Palliative Care Screening Complete  Skilled Nursing Facility Not Applicable

## 2022-07-15 NOTE — Progress Notes (Signed)
[facility]. Will update family of transport.

## 2022-07-15 NOTE — Progress Notes (Signed)
Late entry-   At bedside 4/16 PM with [name], her daughters Efraim Kaufmann and Shawna Orleans, and Helmut Muster from palliative care team. Discussed Betty Jackson's hospital course, three hospitalizations for pneumonia in past 6 months, and how she is currently feeling. Discussed the balanace of controlling her pain, but with too much sedation increased risk of confusion, aspiration, and recurrent pneumonia. Hospice care was introduced. Discussed supportive services, and elicitied [name]'s goals. She was clear that she does not want to keep coming back to the hospital, and would like the extra services that hospice provides. Discussed discontinuing statin medication. Discussed continuing/finishing to treat this pneumonia course. Answered all questions and concerns about hospice care, she would like to return to her facility with the hospice services. Greatly appreciate Helmut Muster and the palliative care team.   Burley Saver MD

## 2022-07-15 NOTE — Plan of Care (Signed)

## 2022-07-15 NOTE — Progress Notes (Signed)
Palliative:  Lacoria is resting comfortably asleep. I did not awaken. No family currently at bedside. I will be available as needed. Patient and family have my contact information.   No charge  Yong Channel, NP Palliative Medicine Team Pager [phone] (Please see [email or web address] for schedule) Team Phone [phone]

## 2022-08-17 ENCOUNTER — Ambulatory Visit: Payer: 59 | Attending: Cardiovascular Disease | Admitting: Cardiovascular Disease

## 2022-08-17 NOTE — Progress Notes (Deleted)
No chief complaint on file.  History of Present Illness: 88 yo female with history of chronic diastolic CHF, paroxysmal atrial fibrillation, PVCs, PACs, CAD s/p remote stent RCA, HTN, HLD, GERD, temporal arteritis, pericardial effusion s/p pericardial window here today for cardiac follow up. She had a stent placed in the RCA in [place], [place] in 2005. She moved to [facility] for treatment of her temporal arteritis at [facility] and to be near family. She had normal ABI in 2012. Mild carotid disease by dopplers 2014. Cardiac cath 12/09/12 with patent RCA stent, mild disease LAD and Circumflex, normal filling pressures. Echo August 2014 with normal LV size and function, no significant valve issues. She was seen in our office 06/19/14 with c/o chest pain. Stress myoview 06/29/14 with no ischemia. She was admitted to [facility] in February 2019 with influenza A, pneumonia and sepsis. Her beta blocker was held and she developed atrial fibrillation with RVR. She also had frequent PVCs and PACs. She converted back to sinus after several hours. Echo February 2019 with normal LV size and function, grade 2 diastolic dysfunction, mild AI. She was not discharged on anticoagulation. She wore a cardiac monitor and had recurrent atrial fib. CHADS VASC score 4. She was seen by Herma Carson, PA 07/06/17 and she was started on Eliquis. I saw her in May 2019 and she c/o chest pain with exertion. Nuclear stress test 08/31/17 with no ischemia. She was having minor nose bleeds on Eliquis. She was referred to ENT but she did not go to that appointment. I saw her 09/20/17 and she c/o weight gain and worsened LE edema. Lasix was increased and Norvasc was stopped. She was admitted to [facility] in December 2020 with a large pericardial effusion and had a sub-xiphoid window per Dr. Donata Clay. The effusion was culture negative and pathology only showed inflammation. She was admitted to [facility] in January 2021 with a left ischium fracture. No surgery was performed. Troponin  was elevated in the setting of possible sepsis but blood cultures were negative. Cardiology was consulted. Echo January 2021 with normal LV systolic function, no significant valve disease. She was admitted to [facility] in May 2023 with pneumonia and had atrial fib with RVR during the admission. She was then admitted with sepsis in June 2023 and had GI bleeding due to severe esophagitis and gastritis. She was able to resume her Eliquis before discharge. Cardiac monitor November 2023 with sinus, PACs, PVCs and short runs of SVT. No AV block.   She is here today for follow up. The patient denies any chest pain, dyspnea, palpitations, lower extremity edema, orthopnea, PND, dizziness, near syncope or syncope.   Primary Care Physician: Keane Police, MD  Past Medical History:  Diagnosis Date   Acute respiratory failure with hypoxia (HCC) 05/20/2017   Anemia, unspecified 10/28/2012   Anxiety state, unspecified 10/28/2012   CAD (coronary artery disease)    a. Stent RCA in [facility];  b. Cath approx 2009 - nonobs per pt report.   Cataract    immature on the left eye   Chronic insomnia 02/07/2013   Chronic lower back pain    scoliosis   CKD (chronic kidney disease) stage 3, GFR 30-59 ml/min (HCC)    CVA (cerebral infarction) 10/29/2012   DDD (degenerative disc disease)    Diverticulosis    GERD (gastroesophageal reflux disease) 09/01/2010   Hemorrhoids    Herniated nucleus pulposus, L5-S1, right 11/04/2015   Hyperlipidemia    takes Lipitor daily  Hypertension    takes Amlodipine,Losartan,Metoprolol,and HCTZ daily   Incontinence of urine    Insomnia    takes Restoril nightly   Lumbar stenosis 04/25/2013   Major depressive disorder, recurrent episode, moderate (HCC) 07/16/2013   Osteoporosis    PAF (paroxysmal atrial fibrillation) (HCC) 2011   a. lone epidode in 2011 according to notes.   Rheumatoid arthritis (HCC)    Scoliosis    Sepsis (HCC) 05/2019   Stroke (HCC)     Temporal arteritis (HCC) 2011   a. followed @ [facility]; potential flareup 10/28/2012/notes 10/28/2012   Vocal cord dysfunction    "they don't operate properly" (10/28/2012)    Past Surgical History:  Procedure Laterality Date   ABDOMINAL HYSTERECTOMY  ~ 1984   vaginally   BACK SURGERY  7-78yrs ago   X Stop   BIOPSY  05/23/2022   Procedure: BIOPSY;  Surgeon: Tressia Danas, MD;  Location: [facility] ENDOSCOPY;  Service: Gastroenterology;;   BLADDER SUSPENSION  2001   BREAST BIOPSY Right    CATARACT EXTRACTION W/ INTRAOCULAR LENS IMPLANT Right ~ 08/2012   CHEST TUBE INSERTION Left 03/08/2019   Procedure: Chest Tube Insertion;  Surgeon: Kerin Perna, MD;  Location: [facility] OR;  Service: Thoracic;  Laterality: Left;   COLONOSCOPY  01/26/2012   Procedure: COLONOSCOPY;  Surgeon: Meryl Dare, MD,FACG;  Location: [facility] ENDOSCOPY;  Service: Endoscopy;  Laterality: N/A;  note the EGD is possible   COLONOSCOPY WITH PROPOFOL N/A 09/10/2021   Procedure: COLONOSCOPY WITH PROPOFOL;  Surgeon: Iva Boop, MD;  Location: [facility] ENDOSCOPY;  Service: Gastroenterology;  Laterality: N/A;   CORONARY ANGIOPLASTY WITH STENT PLACEMENT  2006   X 1 stent   EPIDURAL BLOCK INJECTION     ESOPHAGOGASTRODUODENOSCOPY  01/26/2012   Procedure: ESOPHAGOGASTRODUODENOSCOPY (EGD);  Surgeon: Meryl Dare, MD,FACG;  Location: [facility] ENDOSCOPY;  Service: Endoscopy;  Laterality: N/A;   ESOPHAGOGASTRODUODENOSCOPY (EGD) WITH PROPOFOL N/A 08/05/2021   Procedure: ESOPHAGOGASTRODUODENOSCOPY (EGD) WITH PROPOFOL;  Surgeon: Sherrilyn Rist, MD;  Location: [facility] ENDOSCOPY;  Service: Gastroenterology;  Laterality: N/A;   ESOPHAGOGASTRODUODENOSCOPY (EGD) WITH PROPOFOL N/A 09/10/2021   Procedure: ESOPHAGOGASTRODUODENOSCOPY (EGD) WITH PROPOFOL;  Surgeon: Iva Boop, MD;  Location: [facility] ENDOSCOPY;  Service: Gastroenterology;  Laterality: N/A;   ESOPHAGOGASTRODUODENOSCOPY (EGD) WITH PROPOFOL N/A 05/23/2022   Procedure: ESOPHAGOGASTRODUODENOSCOPY (EGD) WITH  PROPOFOL;  Surgeon: Tressia Danas, MD;  Location: [facility] ENDOSCOPY;  Service: Gastroenterology;  Laterality: N/A;   FINE NEEDLE ASPIRATION Right 09/26/2019   Procedure: FINE NEEDLE ASPIRATION;  Surgeon: Myrene Galas, MD;  Location: [facility] OR;  Service: Orthopedics;  Laterality: Right;   FOREIGN BODY REMOVAL  08/05/2021   Procedure: FOREIGN BODY REMOVAL;  Surgeon: Sherrilyn Rist, MD;  Location: [facility] ENDOSCOPY;  Service: Gastroenterology;;   HEMIARTHROPLASTY HIP Right 2012   IR EPIDUROGRAPHY  04/20/2019   LAPAROSCOPIC CHOLECYSTECTOMY  2001   LUMBAR FUSION  03/2013   LUMBAR LAMINECTOMY/DECOMPRESSION MICRODISCECTOMY Right 11/04/2015   Procedure: Right Lumbar Five-Sacral One Microdiskectomy;  Surgeon: Barnett Abu, MD;  Location: [facility] NEURO ORS;  Service: Neurosurgery;  Laterality: Right;  Right L5-S1 Microdiskectomy   moles removed that required stiches     one on leg and one on face   ORIF FEMUR FRACTURE Right 09/26/2019   Procedure: OPEN REDUCTION INTERNAL FIXATION (ORIF) DISTAL FEMUR FRACTURE;  Surgeon: Myrene Galas, MD;  Location: [facility] OR;  Service: Orthopedics;  Laterality: Right;   ORIF FEMUR FRACTURE Right 10/17/2019   Procedure: OPEN REDUCTION INTERNAL FIXATION (ORIF) DISTAL FEMUR FRACTURE;  Surgeon:  Myrene Galas, MD;  Location: [facility] OR;  Service: Orthopedics;  Laterality: Right;   POLYPECTOMY  09/10/2021   Procedure: POLYPECTOMY;  Surgeon: Iva Boop, MD;  Location: [facility] ENDOSCOPY;  Service: Gastroenterology;;   SUBXYPHOID PERICARDIAL WINDOW N/A 03/08/2019   Procedure: SUBXYPHOID PERICARDIAL WINDOW;  Surgeon: Kerin Perna, MD;  Location: [facility] OR;  Service: Thoracic;  Laterality: N/A;   TEE WITHOUT CARDIOVERSION N/A 03/08/2019   Procedure: TRANSESOPHAGEAL ECHOCARDIOGRAM (TEE);  Surgeon: Donata Clay, Theron Arista, MD;  Location: [facility] OR;  Service: Thoracic;  Laterality: N/A;   TEMPORAL ARTERY BIOPSY / LIGATION Bilateral 2011   TONSILLECTOMY AND ADENOIDECTOMY     at age 24   TUBAL LIGATION  ~ 48   X-STOP  IMPLANTATION  ~ 2010   "lower back" (10/28/2012)    Current Outpatient Medications  Medication Sig Dispense Refill   acetaminophen (TYLENOL) 500 MG tablet Take 500 mg by mouth every 6 (six) hours as needed for mild pain.     allopurinol (ZYLOPRIM) 100 MG tablet Take 100 mg by mouth daily.     diltiazem (CARDIZEM CD) 240 MG 24 hr capsule Take 240 mg by mouth every evening.     ELIQUIS 5 MG TABS tablet TAKE 1 TABLET BY MOUTH TWICE A DAY (Patient taking differently: Take 5 mg by mouth 2 (two) times daily.) 60 tablet 5   escitalopram (LEXAPRO) 10 MG tablet TAKE 1 TABLET BY MOUTH EVERY DAY (Patient taking differently: Take 10 mg by mouth daily.) 90 tablet 1   fentaNYL (DURAGESIC) 25 MCG/HR Place 1 patch onto the skin every 3 (three) days. 5 patch 0   furosemide (LASIX) 40 MG tablet Take 1 tablet (40 mg total) by mouth daily as needed (extra dose, for weight gain of 2 pounds overnight or 5 pound in 1 week).     HYDROcodone-acetaminophen (NORCO) 5-325 MG tablet Take 1 tablet by mouth every 12 (twelve) hours as needed for severe pain. 60 tablet 0   HYDROcodone-acetaminophen (NORCO) 5-325 MG tablet Take 1 tablet by mouth at bedtime. 30 tablet 0   ipratropium-albuterol (DUONEB) 0.5-2.5 (3) MG/3ML SOLN Take 3 mLs by nebulization every 4 (four) hours as needed. (Patient taking differently: Take 3 mLs by nebulization every 4 (four) hours as needed (wheezing/shortness of breath).) 360 mL    nitroGLYCERIN (NITROSTAT) 0.4 MG SL tablet Place 1 tablet (0.4 mg total) under the tongue every 5 (five) minutes as needed for chest pain. 25 tablet 3   ondansetron (ZOFRAN) 4 MG tablet Take 4 mg by mouth every 8 (eight) hours as needed for nausea or vomiting.     OXYGEN Inhale 2 L/min into the lungs as needed (maintain SATS above 90%).     polyethylene glycol (MIRALAX / GLYCOLAX) 17 g packet Take 17 g by mouth daily as needed for mild constipation. 14 each 0   Pramox-PE-Glycerin-Petrolatum (HEMORRHOIDAL MAX STR)  1-0.25-14.4-15 % CREA Apply 1 Application topically 2 (two) times daily as needed (hemorrhoidal pain/discomfort.). 25.5 g 0   predniSONE (DELTASONE) 10 MG tablet Take 1 tablet (10 mg total) by mouth daily with breakfast. Resume after 3 days of 20mg  daily as instructed. 30 tablet 0   QUEtiapine (SEROQUEL) 25 MG tablet Take 1 tablet (25 mg total) by mouth at bedtime. 30 tablet 0   sulfaSALAzine (AZULFIDINE) 500 MG EC tablet Take 500 mg by mouth daily.     No current facility-administered medications for this visit.    Allergies  Allergen Reactions   Alendronate Sodium  Suspected cause of severe esophagitis (per EGD 08/05/21)   Dextromethorphan Rash    Social History   Socioeconomic History   Marital status: Married    Spouse name: Not on file   Number of children: 3   Years of education: Not on file   Highest education level: Not on file  Occupational History   Occupation: Retired Veterinary surgeon  Tobacco Use   Smoking status: Never   Smokeless tobacco: Never  Vaping Use   Vaping Use: Never used  Substance and Sexual Activity   Alcohol use: Yes    Comment: socially   Drug use: No   Sexual activity: Not on file  Other Topics Concern   Not on file  Social History Narrative   Lives at [place] facility, [employer]   Right Handed   Drinks 1-2 cups caffeine daily   Social Determinants of Health   Financial Resource Strain: Low Risk  (07/16/2020)   Overall Financial Resource Strain (CARDIA)    Difficulty of Paying Living Expenses: Not hard at all  Food Insecurity: Patient Unable To Answer (07/10/2022)   Hunger Vital Sign    Worried About Running Out of Food in the Last Year: Patient unable to answer    Ran Out of Food in the Last Year: Patient unable to answer  Transportation Needs: Patient Unable To Answer (07/10/2022)   PRAPARE - Transportation    Lack of Transportation (Medical): Patient unable to answer    Lack of Transportation (Non-Medical): Patient unable to answer   Physical Activity: Inactive (07/16/2020)   Exercise Vital Sign    Days of Exercise per Week: 0 days    Minutes of Exercise per Session: 0 min  Stress: Not on file  Social Connections: Unknown (07/16/2020)   Social Connection and Isolation Panel [NHANES]    Frequency of Communication with Friends and Family: More than three times a week    Frequency of Social Gatherings with Friends and Family: More than three times a week    Attends Religious Services: Never    Database administrator or Organizations: No    Attends Banker Meetings: Never    Marital Status: Patient declined  Intimate Partner Violence: Patient Unable To Answer (07/10/2022)   Humiliation, Afraid, Rape, and Kick questionnaire    Fear of Current or Ex-Partner: Patient unable to answer    Emotionally Abused: Patient unable to answer    Physically Abused: Patient unable to answer    Sexually Abused: Patient unable to answer    Family History  Problem Relation Age of Onset   Brain cancer Mother 71   Heart attack Father    Breast cancer Daughter 60   Heart disease Other    Hypertension Other    Arthritis Neg Hx    Colon cancer Neg Hx    Osteoporosis Neg Hx     Review of Systems:  As stated in the HPI and otherwise negative.   There were no vitals taken for this visit.  Physical Examination: General: Well developed, well nourished, NAD  HEENT: OP clear, mucus membranes moist  SKIN: warm, dry. No rashes. Neuro: No focal deficits  Musculoskeletal: Muscle strength 5/5 all ext  Psychiatric: Mood and affect normal  Neck: No JVD, no carotid bruits, no thyromegaly, no lymphadenopathy.  Lungs:Clear bilaterally, no wheezes, rhonci, crackles Cardiovascular: Regular rate and rhythm. No murmurs, gallops or rubs. Abdomen:Soft. Bowel sounds present. Non-tender.  Extremities: No lower extremity edema. Pulses are 2 + in the bilateral DP/PT.  Echo January 2021:  1. Left ventricular ejection fraction, by visual  estimation, is 60 to  65%. The left ventricle has normal function. There is mildly increased  left ventricular hypertrophy.   2. Left ventricular diastolic parameters are consistent with Grade II  diastolic dysfunction (pseudonormalization). The mean left atrial pressure  is high.   3. The left ventricle has no regional wall motion abnormalities.   4. Global right ventricle has normal systolic function.The right  ventricular size is normal. No increase in right ventricular wall  thickness.   5. Left atrial size was mildly dilated.   6. Right atrial size was mildly dilated.   7. Mild mitral annular calcification.   8. The mitral valve is normal in structure. Trivial mitral valve  regurgitation.   9. The tricuspid valve is normal in structure.  10. The tricuspid valve is normal in structure. Tricuspid valve  regurgitation is mild-moderate.  11. The aortic valve is normal in structure. Aortic valve regurgitation is  trivial.  12. The pulmonic valve was not well visualized. Pulmonic valve  regurgitation is not visualized.  13. Mildly elevated pulmonary artery systolic pressure.  14. The tricuspid regurgitant velocity is 2.18 m/s, and with an assumed  right atrial pressure of 15 mmHg, the estimated right ventricular systolic  pressure is mildly elevated at 34.0 mmHg.  15. The inferior vena cava is dilated in size with <50% respiratory  variability, suggesting right atrial pressure of 15 mmHg.  16. There is exaggerated respiratory displacement of the interventricular  septum. This could represent contrictive physiology , but can also be seen  with increased work of breathing (e.g. COPD or asthma exacerbation).  17. Compared to 03/09/2019, left ventricular wall motion and overall  systolic function have improved.   EKG:  EKG is *** ordered today. The ekg ordered today demonstrates  Recent Labs: 05/20/2022: B Natriuretic Peptide 122.1 07/11/2022: TSH 2.534 07/13/2022: ALT 13; BUN 23;  Creatinine, Ser 1.14; Hemoglobin 9.0; Magnesium 1.7; Platelets 161; Potassium 4.2; Sodium 141   Lipid Panel    Component Value Date/Time   CHOL 171 11/14/2018 1420   CHOL 155 08/18/2017 1241   TRIG 168.0 (H) 11/14/2018 1420   HDL 63.40 11/14/2018 1420   HDL 78 08/18/2017 1241   CHOLHDL 3 11/14/2018 1420   VLDL 33.6 11/14/2018 1420   LDLCALC 74 11/14/2018 1420   LDLCALC 51 08/18/2017 1241     Wt Readings from Last 3 Encounters:  07/11/22 78.8 kg  05/26/22 84.9 kg  02/25/22 78.2 kg     Assessment and Plan:   1. CAD with stable angina: She has a Taxus drug eluting stent in the RCA, placed in 2005 in [place]. Cath at [facility] September 2014 with patent RCA stent and mild disease LAD and circumflex. Stress test in June 2019 without ischemia. No chest pain suggestive of angina. Will continue statin. No ASA since she is on Eliquis. Beta blocker stopped due to bradycardia. She has SL NTG today to use as needed.   2. HTN: BP is well controlled. No changes today  3. Hyperlipidemia: Lipids followed in primary care. LDL ***. Continue statin   4. PAF: Sinus today on exam. Continue Cardizem   5. Chronic diastolic CHF: Weight is stable. No volume overload on exam. Continue Lasix.    6. Bradycardia: ***  Labs/ tests ordered today include:   No orders of the defined types were placed in this encounter.  Disposition:   F/U with me in 12 months  Signed,  Verne Carrow, MD 08/17/2022 10:34 AM    [facility] [address] [place], [place], [place]  [address] Phone: [phone]; Fax: [phone]

## 2022-08-18 ENCOUNTER — Encounter: Payer: Self-pay | Admitting: Cardiovascular Disease

## 2023-01-29 DEATH — deceased
